# Patient Record
Sex: Male | Born: 1960
Health system: Southern US, Community
[De-identification: ages and names within clinical notes are randomized; demographics above are authoritative.]

## PROBLEM LIST (undated history)

## (undated) DIAGNOSIS — E785 Hyperlipidemia, unspecified: Secondary | ICD-10-CM

## (undated) DIAGNOSIS — Z992 Dependence on renal dialysis: Secondary | ICD-10-CM

## (undated) DIAGNOSIS — E1140 Type 2 diabetes mellitus with diabetic neuropathy, unspecified: Secondary | ICD-10-CM

## (undated) DIAGNOSIS — J449 Chronic obstructive pulmonary disease, unspecified: Secondary | ICD-10-CM

## (undated) DIAGNOSIS — Z9889 Other specified postprocedural states: Secondary | ICD-10-CM

## (undated) DIAGNOSIS — Z87442 Personal history of urinary calculi: Secondary | ICD-10-CM

## (undated) DIAGNOSIS — J309 Allergic rhinitis, unspecified: Secondary | ICD-10-CM

## (undated) DIAGNOSIS — I5022 Chronic systolic (congestive) heart failure: Principal | ICD-10-CM

## (undated) DIAGNOSIS — Z973 Presence of spectacles and contact lenses: Secondary | ICD-10-CM

## (undated) DIAGNOSIS — I428 Other cardiomyopathies: Secondary | ICD-10-CM

## (undated) DIAGNOSIS — J189 Pneumonia, unspecified organism: Secondary | ICD-10-CM

## (undated) DIAGNOSIS — M1990 Unspecified osteoarthritis, unspecified site: Secondary | ICD-10-CM

## (undated) DIAGNOSIS — N200 Calculus of kidney: Secondary | ICD-10-CM

## (undated) DIAGNOSIS — M869 Osteomyelitis, unspecified: Secondary | ICD-10-CM

## (undated) DIAGNOSIS — N186 End stage renal disease: Secondary | ICD-10-CM

## (undated) DIAGNOSIS — I10 Essential (primary) hypertension: Secondary | ICD-10-CM

## (undated) DIAGNOSIS — Z95810 Presence of automatic (implantable) cardiac defibrillator: Secondary | ICD-10-CM

## (undated) DIAGNOSIS — D649 Anemia, unspecified: Secondary | ICD-10-CM

## (undated) DIAGNOSIS — E1121 Type 2 diabetes mellitus with diabetic nephropathy: Secondary | ICD-10-CM

## (undated) DIAGNOSIS — E119 Type 2 diabetes mellitus without complications: Secondary | ICD-10-CM

## (undated) HISTORY — DX: Essential (primary) hypertension: I10

## (undated) HISTORY — DX: Unspecified osteoarthritis, unspecified site: M19.90

## (undated) HISTORY — DX: Chronic obstructive pulmonary disease, unspecified: J44.9

## (undated) HISTORY — DX: Allergic rhinitis, unspecified: J30.9

## (undated) HISTORY — DX: Type 2 diabetes mellitus without complications: E11.9

## (undated) HISTORY — DX: Calculus of kidney: N20.0

## (undated) HISTORY — PX: CARDIAC CATHETERIZATION: SHX172

## (undated) HISTORY — DX: Other specified postprocedural states: Z98.890

## (undated) HISTORY — DX: Anemia, unspecified: D64.9

## (undated) HISTORY — DX: Other cardiomyopathies: I42.8

## (undated) HISTORY — DX: Type 2 diabetes mellitus with diabetic nephropathy: E11.21

## (undated) HISTORY — DX: Pneumonia, unspecified organism: J18.9

## (undated) HISTORY — DX: Hyperlipidemia, unspecified: E78.5

## (undated) HISTORY — PX: HERNIA REPAIR: SHX51

## (undated) NOTE — *Deleted (*Deleted)
Physical Medicine and Rehabilitation Admission H&P    CC: Functional deficits due to BKA   HPI: Anthony Bullock is a 63 year old male with history of T2DM with nephropathy/neuropathy, ESRD- HD, NICM s/p AICD, left foot ulcer with osteomyelitis s/p toe amputation with poor wound healing and gangrenous changes. He was admitted on 11/13/19 for L-BKA by Dr. Sharol Given. Post op  HD ongoing on TTS. He has had issues with hypoglycemia and insulin regimen adjusted per recommendations by diabetes coordinater. Therapy ongoing and patient limited by balance deficits with posterior lean with standing as well as issues with endurance affecting ADLs and mobility. CIR recommended due to functional decline.    Review of Systems  Constitutional: Negative for chills and fever.  HENT: Negative for hearing loss and tinnitus.   Eyes: Negative for blurred vision and double vision.  Respiratory: Negative for cough and shortness of breath.   Cardiovascular: Negative for chest pain and palpitations.  Gastrointestinal: Positive for constipation and nausea. Negative for heartburn.  Musculoskeletal: Negative for myalgias and neck pain.  Skin: Negative for itching and rash.  Neurological: Positive for weakness. Negative for dizziness, sensory change, speech change and headaches.  Psychiatric/Behavioral: The patient has insomnia (gets up every 4 hours due to pain).      Past Medical History:  Diagnosis Date  . 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 Allergic rhinitis   . Anemia   . Arthritis   . Chronic systolic heart failure ([place])    a. ECHO (12/2012) EF 25-30%, HK entireanteroseptal myocardium //  b.  EF 25%, diffuse HK, grade 1 diastolic dysfunction, MAC, mild LAE, normal RVSF, trivial pericardial effusion  . COPD (chronic obstructive pulmonary disease) ([place])   . Diabetes mellitus type II   . Diabetic nephropathy ([place])   . Diabetic neuropathy ([place])   . ESRD on hemodialysis  [facility])    started HD June 2017, goes to [facility] HD unit, Dr Hinda Lenis  . History of cardiac catheterization    a.Myoview 1/15:  There is significant left ventricular dysfunction. There may be slight scar at the apex. There is no significant ischemia. LV Ejection Fraction: 27%  //  b. RHC/LHC (1/15) with mean RA 6, PA 47/22 mean 33, mean PCWP 20, PVR 2.5 WU, CI 2.5; 80% dLAD stenosis, 70% diffuse large D.   . History of kidney stones   . Hyperlipidemia   . Hypertension   . Kidney stones   . NICM (nonischemic cardiomyopathy) ([place])    Primarily nonischemic. Echo (12/14) with EF 25-30%. Echo (3/15) with EF 25%, mild to moderately dilated LV, normal RV size and systolic function.   . Osteomyelitis ([place])    left fifth ray  . Pneumonia   . Urethral stricture   . Wears glasses     Past Surgical History:  Procedure Laterality Date  . ABDOMINAL AORTOGRAM W/LOWER EXTREMITY N/A 03/30/2016   Procedure: Abdominal Aortogram w/Lower Extremity;  Surgeon: Angelia Mould, MD;  Location: [place] CV LAB;  Service: Cardiovascular;  Laterality: N/A;  . AMPUTATION Right 04/26/2016   Procedure: Right Below Knee Amputation;  Surgeon: Newt Minion, MD;  Location: [place];  Service: Orthopedics;  Laterality: Right;  . AMPUTATION Left 08/21/2019   Procedure: LEFT FOOT 5TH RAY AMPUTATION;  Surgeon: Newt Minion, MD;  Location: [place];  Service: Orthopedics;  Laterality: Left;  . AMPUTATION Left 11/13/2019   Procedure: LEFT BELOW KNEE AMPUTATION;  Surgeon: Newt Minion,  MD;  Location: [place];  Service: Orthopedics;  Laterality: Left;  . AV FISTULA PLACEMENT Right 09/08/2015   Procedure: INSERTION OF 4-15m x 45cm  ARTERIOVENOUS (AV) GORE-TEX GRAFT RIGHT UPPER  ARM;  Surgeon: CAngelia Mould MD;  [place]  Service: Vascular;  Laterality: Right;  . AV FISTULA PLACEMENT Left 01/14/2016   Procedure: CREATION OF LEFT UPPER ARM ARTERIOVENOUS FISTULA;  Surgeon: CAngelia Mould MD;  [place]  Service: Vascular;  Laterality: Left;  . BASCILIC VEIN TRANSPOSITION Right 08/22/2014   Procedure: RIGHT UPPER ARM BASCILIC VEIN TRANSPOSITION;  Surgeon: CAngelia Mould MD;  [place]  Service: Vascular;  Laterality: Right;  . BELOW KNEE LEG AMPUTATION Right 04/26/2016  . CARDIAC CATHETERIZATION    . CARDIAC DEFIBRILLATOR PLACEMENT  06/27/2013   Sub Q       BY DR KCaryl Comes . CATARACT EXTRACTION W/PHACO Right 08/06/2018   Procedure: CATARACT EXTRACTION PHACO AND INTRAOCULAR LENS PLACEMENT (IOC);  Surgeon: WBaruch Goldmann MD;  Location: [facility];  Service: Ophthalmology;  Laterality: Right;  CDE: 4.06  . CATARACT EXTRACTION W/PHACO Left 08/20/2018   Procedure: CATARACT EXTRACTION PHACO AND INTRAOCULAR LENS PLACEMENT (IOC);  Surgeon: WBaruch Goldmann MD;  Location: [facility];  Service: Ophthalmology;  Laterality: Left;  CDE: 6.76  . COLONOSCOPY WITH PROPOFOL N/A 07/22/2015   Procedure: COLONOSCOPY WITH PROPOFOL;  Surgeon: HDoran Stabler MD;  [facility];  Service: Gastroenterology;  Laterality: N/A;  . FEMORAL-POPLITEAL BYPASS GRAFT Right 03/31/2016   Procedure: BYPASS GRAFT FEMORAL-POPLITEAL ARTERY USING RIGHT GREATER SAPHENOUS NONREVERSED VEIN;  Surgeon: CAngelia Mould MD;  [place]  Service: Vascular;  Laterality: Right;  . HERNIA REPAIR    . I & D EXTREMITY Right 03/31/2016   Procedure: IRRIGATION AND DEBRIDEMENT FOOT;  Surgeon: CAngelia Mould MD;  [place]  Service: Vascular;  Laterality: Right;  . IMPLANTABLE CARDIOVERTER DEFIBRILLATOR IMPLANT N/A 06/27/2013   Procedure: SUB Q ICD;  Surgeon: SDeboraha Sprang MD;  [facility];  Service: Cardiovascular;  Laterality: N/A;  . INTRAOPERATIVE ARTERIOGRAM Right 03/31/2016   Procedure: INTRA OPERATIVE ARTERIOGRAM;  Surgeon: CAngelia Mould MD;  [place]  Service: Vascular;  Laterality: Right;  . IR GENERIC HISTORICAL Right 11/30/2015   IR THROMBECTOMY AV FISTULA  W/THROMBOLYSIS/PTA INC/SHUNT/IMG RIGHT 11/30/2015 GAletta Edouard MD [facility]  . IR GENERIC HISTORICAL  11/30/2015   IR [place] ACCESS RIGHT 11/30/2015 GAletta Edouard MD [facility]  . IR GENERIC HISTORICAL Right 12/15/2015   IR THROMBECTOMY AV FISTULA W/THROMBOLYSIS/PTA/STENT INC/SHUNT/IMG RT 12/15/2015 DArne Cleveland MD [facility]  . IR GENERIC HISTORICAL  12/15/2015   IR [place] V[place] ACCESS RIGHT 12/15/2015 DArne Cleveland MD [facility]  . IR GENERIC HISTORICAL  12/28/2015   IR FLUORO GUIDE CV LINE RIGHT 12/28/2015 AMarybelle Killings MD [facility]  . IR GENERIC HISTORICAL  12/28/2015   IR [place] V[place] ACCESS RIGHT 12/28/2015 AMarybelle Killings MD [facility]  . LEFT A ND RIGHT HEART CATH  01/30/2013   DR BSung Amabile . LEFT AND RIGHT HEART CATHETERIZATION WITH CORONARY ANGIOGRAM N/A 01/30/2013   Procedure: LEFT AND RIGHT HEART CATHETERIZATION WITH CORONARY ANGIOGRAM;  Surgeon: DJolaine Artist MD;  [facility];  Service: Cardiovascular;  Laterality: N/A;  . PERIPHERAL VASCULAR CATHETERIZATION Right 01/26/2015   Procedure: A/V Fistulagram;  Surgeon: CAngelia Mould MD;  [facility];  Service: Cardiovascular;  Laterality: Right;  .  reapea urethral surgery for recurrent obstruction  2011  . TOTAL KNEE ARTHROPLASTY Right 2007  . VEIN HARVEST Right 03/31/2016   Procedure: RIGHT GREATER SAPHENOUS VEIN HARVEST;  Surgeon: Angelia Mould, MD;  [facility];  Service: Vascular;  Laterality: Right;    Family History  Problem Relation Age of Onset  . Bladder Cancer Mother   . Alcohol abuse Father   . Melanoma Father   . Stroke Maternal Grandmother   . Heart Problems Maternal Grandmother        unknown  . Diabetes Maternal Grandmother   . Heart disease Maternal Grandfather   . Prostate cancer Maternal Grandfather     Social History:  Married. Used to work for [employer]--disabled. He reports that he quit smoking about 9 years ago. His smoking use  included cigarettes. He has a 64.00 pack-year smoking history. He has never used smokeless tobacco. He reports that he does not drink alcohol and does not use drugs.   Allergies  Allergen Reactions  . Epoetin Alfa Other (See Comments)    unknown  . Ferumoxytol Other (See Comments)    unknown  . Morphine Sulfate Rash and Other (See Comments)    Itches all over, red spots    Medications Prior to Admission  Medication Sig Dispense Refill  . amLODipine (NORVASC) 10 MG tablet Take 1 tablet (10 mg total) by mouth daily. 90 tablet 2  . aspirin EC 81 MG tablet Take 81 mg by mouth daily.     [name] atorvastatin (LIPITOR) 80 MG tablet Take 1 tablet (80 mg total) by mouth at bedtime. 90 tablet 3  . carvedilol (COREG) 25 MG tablet Take 1 tablet (25 mg total) by mouth 2 (two) times daily. 180 tablet 2  . doxycycline (VIBRA-TABS) 100 MG tablet Take 1 tablet (100 mg total) by mouth 2 (two) times daily. 28 tablet 0  . fenofibrate 160 MG tablet Take 1 tablet (160 mg total) by mouth daily. 90 tablet 3  . fexofenadine (ALLEGRA) 180 MG tablet Take 180 mg by mouth daily.    . furosemide (LASIX) 40 MG tablet Take 3 tablets by mouth once daily (Patient taking differently: Take 120 mg by mouth daily. ) 270 tablet 0  . gabapentin (NEURONTIN) 300 MG capsule Take 2 capsules (600 mg total) by mouth 2 (two) times daily. 360 capsule 3  . Insulin Glargine (BASAGLAR KWIKPEN) 100 UNIT/ML INJECT 70 UNITS SUBCUTANEOUSLY AT BEDTIME (Patient taking differently: Inject 2-80 Units into the skin at bedtime as needed (low blood glucose). Sliding scale) 15 mL 11  . Insulin Lispro (HUMALOG KWIKPEN) 200 UNIT/ML SOPN Inject 10 Units into the skin 3 (three) times daily with meals. Use tid with meals as per sliding scale (Patient taking differently: Inject 2-14 Units into the skin 3 (three) times daily with meals. sliding scale) 4 pen 6  . lanthanum (FOSRENOL) 500 MG chewable tablet Chew 500-1,000 mg by mouth See admin instructions. Take  2000 mg with meals three time a day and 500 mg with snacks    . multivitamin (RENA-VIT) TABS tablet Take 1 tablet by mouth daily. 90 tablet 3  . albuterol (VENTOLIN HFA) 108 (90 Base) MCG/ACT inhaler Inhale 2 puffs into the lungs every 4 (four) hours as needed for wheezing or shortness of breath. 1 each 1  . azelastine (ASTELIN) 0.1 % nasal spray Place 2 sprays into both nostrils 3 (three) times daily as needed for rhinitis. Use in each nostril as directed 30 mL 12  .  Continuous Blood Gluc Sensor (FREESTYLE LIBRE 14 DAY SENSOR) MISC USE AS DIRECTED EVERY 14 DAYS 4 each 11  . glucose blood test strip Check 1 time daily. E11.9 One Touch Ultra Blue Test Strips 100 each 3  . Insulin Pen Needle (BD PEN NEEDLE NANO U/F) 32G X 4 MM MISC USE 1 PEN NEEDLE SUBCUTANEOUSLY WITH INSULIN 4 TIMES DAILY 400 each 0  . nitroGLYCERIN (NITRODUR - DOSED IN MG/24 HR) 0.2 mg/hr patch Place 1 patch (0.2 mg total) onto the skin daily. (Patient not taking: Reported on 11/08/2019) 30 patch 4  . Olopatadine HCl (PATADAY) 0.2 % SOLN Place 1 drop into both eyes daily as needed (for allergies).       Drug Regimen Review { DRUG REGIMEN [id number]}  Home: Home Living Family/patient expects to be discharged to:: Private residence Living Arrangements: Spouse/significant other Available Help at Discharge: Family, Available PRN/intermittently Type of Home: House Home Access: Ramped entrance (steep ramp) Home Layout: One level Bathroom Toilet: Handicapped height (BSC over toilet) Bathroom Accessibility: No Home Equipment: Walker - 2 wheels, Wheelchair - manual, Bedside commode, Other (comment) (sliding board) Additional Comments: Wheelchair will not fit in bathroom/bedroom doorways. Wife works full time, but reports able to work from home as needed. Have some church members that can assist as needed   Functional History: Prior Function Level of Independence: Needs assistance Gait / Transfers Assistance Needed: used  wheelchair/RW to get around in the house. Wife pushed pt in wheelchair in/out of the home ADL's / Homemaking Assistance Needed: Wife assisted with sponge bathing, toilet transfers Comments: Prior to recent foot amputation, pt Independent in all daily tasks, was walking with R prosthetic LE and no AD. Has required increased assistance for ADLs/transfers since foot amputations  Functional Status:  Mobility: Bed Mobility Overal bed mobility: Needs Assistance Bed Mobility: Supine to Sit Supine to sit: Mod assist, HOB elevated Sit to supine: Mod assist General bed mobility comments: Mod A for trunk advancement and scooting R hip forward Transfers Overall transfer level: Needs assistance Equipment used: Rolling walker (2 wheeled), None Transfers: Sit to/from Stand, Radiographer, therapeutic Sit to Stand: Max assist, +2 physical assistance, +2 safety/equipment Anterior-Posterior transfers: Min assist General transfer comment: Initially attempted sit to stand transfers in prep for pivot to chair with RW and R prosthetic. Pt Max A x 2 for sit to stand, Max A to maintain standing balance with posterior lean. Opted for AP transfer from bed <> recliner with Min A at most and cueing for sequencing      ADL: ADL Overall ADL's : Needs assistance/impaired Eating/Feeding: Set up, Sitting Grooming: Sitting, Min guard Upper Body Bathing: Minimal assistance, Sitting Lower Body Bathing: Maximal assistance, Sitting/lateral leans Upper Body Dressing : Minimal assistance, Sitting Upper Body Dressing Details (indicate cue type and reason): Min A to doff/don new gown Lower Body Dressing: Moderate assistance, Sitting/lateral leans Lower Body Dressing Details (indicate cue type and reason): Pt overall min guard for donning R prosthetic sitting EOB to maintain balance. Pt will require increased assist for donning pants/underwear but will do better with lateral leans. Standing for ADLs unsafe at this time  Toileting- Clothing Manipulation and Hygiene: Maximal assistance, Sitting/lateral lean, Bed level General ADL Comments: Improved pain control, still limited by weakness and decreased balance  Cognition: Cognition Overall Cognitive Status: Within Functional Limits for tasks assessed Orientation Level: Oriented X4 Cognition Arousal/Alertness: Awake/alert Behavior During Therapy: WFL for tasks assessed/performed Overall Cognitive Status: Within Functional Limits for tasks assessed General Comments: pleasant and  cooperative  Physical Exam: Blood pressure 136/78, pulse 82, temperature 98.4 F (36.9 C), temperature source Oral, resp. rate 17, height _0  (1.778 m), weight 82.1 kg, SpO2 97 %. Physical Exam Vitals and nursing note reviewed.  Constitutional:      Appearance: Normal appearance.  Musculoskeletal:     Comments: Right BKA site well healed.  L-BKAn with limb guard over wound VAC.   Neurological:     Mental Status: He is alert.     Results for orders placed or performed during the hospital encounter of 11/13/19 (from the past 48 hour(s))  Glucose, capillary     Status: Abnormal   Collection Time: 11/13/19 12:23 PM  Result Value Ref Range   Glucose-Capillary 140 (H) 70 - 99 mg/dL    Comment: Glucose reference range applies only to samples taken after fasting for at least 8 hours.  Glucose, capillary     Status: Abnormal   Collection Time: 11/13/19  1:25 PM  Result Value Ref Range   Glucose-Capillary 146 (H) 70 - 99 mg/dL    Comment: Glucose reference range applies only to samples taken after fasting for at least 8 hours.  Glucose, capillary     Status: Abnormal   Collection Time: 11/13/19  4:37 PM  Result Value Ref Range   Glucose-Capillary 136 (H) 70 - 99 mg/dL    Comment: Glucose reference range applies only to samples taken after fasting for at least 8 hours.  Hemoglobin A1c     Status: Abnormal   Collection Time: 11/13/19  4:43 PM  Result Value Ref Range   Hgb  A1c MFr Bld 6.5 (H) 4.8 - 5.6 %    Comment: (NOTE) Pre diabetes:          5.7%-6.4%  Diabetes:              >6.4%  Glycemic control for   <7.0% adults with diabetes    Mean Plasma Glucose 139.85 mg/dL    Comment: Performed at [facility] [address], [place], [place] [address]  Glucose, capillary     Status: Abnormal   Collection Time: 11/13/19  8:58 PM  Result Value Ref Range   Glucose-Capillary 244 (H) 70 - 99 mg/dL    Comment: Glucose reference range applies only to samples taken after fasting for at least 8 hours.  Glucose, capillary     Status: None   Collection Time: 11/14/19  6:32 AM  Result Value Ref Range   Glucose-Capillary 85 70 - 99 mg/dL    Comment: Glucose reference range applies only to samples taken after fasting for at least 8 hours.  Hepatitis B surface antigen     Status: None   Collection Time: 11/14/19  6:51 AM  Result Value Ref Range   Hepatitis B Surface Ag NON REACTIVE NON REACTIVE    Comment: Performed at [facility] [address], [place], [place] [address]  CBC     Status: Abnormal   Collection Time: 11/14/19  6:51 AM  Result Value Ref Range   WBC 11.1 (H) 4.0 - 10.5 K/uL   RBC 3.90 (L) 4.22 - 5.81 MIL/uL   Hemoglobin 11.1 (L) 13.0 - 17.0 g/dL   HCT 35.1 (L) 39 - 52 %   MCV 90.0 80.0 - 100.0 fL   MCH 28.5 26.0 - 34.0 pg   MCHC 31.6 30.0 - 36.0 g/dL   RDW 17.1 (H) 11.5 - 15.5 %   Platelets 148 (L) 150 -  400 K/uL   nRBC 0.0 0.0 - 0.2 %    Comment: Performed at [facility], [place] [address] [place], [place] [address]  Renal function panel     Status: Abnormal   Collection Time: 11/14/19  6:51 AM  Result Value Ref Range   Sodium 136 135 - 145 mmol/L   Potassium 5.6 (H) 3.5 - 5.1 mmol/L   Chloride 100 98 - 111 mmol/L   CO2 24 22 - 32 mmol/L   Glucose, Bld 104 (H) 70 - 99 mg/dL    Comment: Glucose reference range applies only to samples taken after fasting for at least 8 hours.   BUN 65 (H) 6 - 20 mg/dL   Creatinine, Ser  11.29 (H) 0.61 - 1.24 mg/dL   Calcium 8.6 (L) 8.9 - 10.3 mg/dL   Phosphorus 3.4 2.5 - 4.6 mg/dL   Albumin 3.0 (L) 3.5 - 5.0 g/dL   GFR, Estimated 5 (L) >60 mL/min    Comment: (NOTE) Calculated using the CKD-EPI Creatinine Equation (2021)    Anion gap 12 5 - 15    Comment: Performed at [facility] [address], [place], [place] [address]  Glucose, capillary     Status: None   Collection Time: 11/14/19 11:37 AM  Result Value Ref Range   Glucose-Capillary 81 70 - 99 mg/dL    Comment: Glucose reference range applies only to samples taken after fasting for at least 8 hours.  Glucose, capillary     Status: Abnormal   Collection Time: 11/14/19  4:58 PM  Result Value Ref Range   Glucose-Capillary 127 (H) 70 - 99 mg/dL    Comment: Glucose reference range applies only to samples taken after fasting for at least 8 hours.  Glucose, capillary     Status: Abnormal   Collection Time: 11/14/19  7:35 PM  Result Value Ref Range   Glucose-Capillary 141 (H) 70 - 99 mg/dL    Comment: Glucose reference range applies only to samples taken after fasting for at least 8 hours.  Glucose, capillary     Status: None   Collection Time: 11/15/19  6:56 AM  Result Value Ref Range   Glucose-Capillary 78 70 - 99 mg/dL    Comment: Glucose reference range applies only to samples taken after fasting for at least 8 hours.  CBC     Status: Abnormal   Collection Time: 11/15/19  8:04 AM  Result Value Ref Range   WBC 9.0 4.0 - 10.5 K/uL   RBC 3.98 (L) 4.22 - 5.81 MIL/uL   Hemoglobin 11.3 (L) 13.0 - 17.0 g/dL   HCT 36.3 (L) 39 - 52 %   MCV 91.2 80.0 - 100.0 fL   MCH 28.4 26.0 - 34.0 pg   MCHC 31.1 30.0 - 36.0 g/dL   RDW 17.3 (H) 11.5 - 15.5 %   Platelets 151 150 - 400 K/uL   nRBC 0.0 0.0 - 0.2 %    Comment: Performed at [facility] [address] [place], [place] [address]  Renal function panel     Status: Abnormal   Collection Time: 11/15/19  8:04 AM  Result Value Ref Range   Sodium 136 135 -  145 mmol/L   Potassium 4.6 3.5 - 5.1 mmol/L    Comment: NO VISIBLE HEMOLYSIS   Chloride 97 (L) 98 - 111 mmol/L   CO2 26 22 - 32 mmol/L   Glucose, Bld 93 70 - 99 mg/dL    Comment:  Glucose reference range applies only to samples taken after fasting for at least 8 hours.   BUN 34 (H) 6 - 20 mg/dL   Creatinine, Ser 8.48 (H) 0.61 - 1.24 mg/dL   Calcium 8.6 (L) 8.9 - 10.3 mg/dL   Phosphorus 4.3 2.5 - 4.6 mg/dL   Albumin 3.0 (L) 3.5 - 5.0 g/dL   GFR, Estimated 7 (L) >60 mL/min    Comment: (NOTE) Calculated using the CKD-EPI Creatinine Equation (2021)    Anion gap 13 5 - 15    Comment: Performed at [facility] [address], [place], [place] [address]  Glucose, capillary     Status: None   Collection Time: 11/15/19 11:05 AM  Result Value Ref Range   Glucose-Capillary 93 70 - 99 mg/dL    Comment: Glucose reference range applies only to samples taken after fasting for at least 8 hours.   No results found.     Medical Problem List and Plan: 1.  *** secondary to ***  -patient may *** shower  -ELOS/Goals: *** 2.  Antithrombotics: -DVT/anticoagulation:  Pharmaceutical: Heparin  -antiplatelet therapy: ASA 3. Pain Management:  Oxycodone prn not effective. Still taking IV dilaudid    --will decrease gabapentin to 100 mg tid (renal dose) 4. Mood: LCSW to follow for evaluation and support.   -antipsychotic agents: N/A 5. Neuropsych: This patient is capable of making decisions on his own behalf. 6. Skin/Wound Care: Routine pressure relief measures.    --Wound VAC to stay in place till 11/10 7. Fluids/Electrolytes/Nutrition: Strict I/O.   -- Renal/CM diet with 1200 cc/FR 8. HTN : Monitor BP tid--continue Amlodipine, coreg and Furosemide 9. ESRD: HD - TTS with nephrology following for assist.    --Schedule HD at the end of the day to help with tolerance of therapy.  10. T2DM with neuropathy/nephropathy: Hgb A1c-6.5 and well controlled.  Reports was on Basaglar 12 units at bedtime  with meal coverage/SSI.      --Lantus decreased to 25 units on 11/05. May need to decrease further as activity increases.  11.NICM s/p AICD: Monitor for symptoms with increase in mobility.  Continue Lipitor, coreg, ASA, fenofibrate and Lasix  12. Nausea: Has been intermittent. Question due to constipation--will change colace to Senna S and add pepcid for dyspepsia.     ***  Bary Leriche, PA-C 11/15/2019

## (undated) NOTE — *Deleted (*Deleted)
Pharmacy Resident Rounding Note - for learning purposes only, not an active part of the chart  S/o Admit Complaint: osteo, toe amp >> s/p left BKA    Anticoagulation [place] hep q8 Infectious Disease  Cephalexin 500mg  q12h - for 'red spot' which is improving, dose appropriate >> entered 10d stop for abx Cardiovascular Amlo5, bASA, ator 80, coreg 25, fenofibrate  Endocrinology CBGs <180 basaglar 12u HS at home, lantus 24u HS here >> 12 units HS + sSSI -CBGs controlled Gastrointestinal / Nutrition famo Neurology Gaba 100 + 200 HS, trazodone Nephrology - esrd on tts schedule w/ TDC, has needed tpa for clotting ESA outpt epo 4k u IV TIW HD 11/16 (3.5h, 1.7L UF),  11/13: Alb 2.6, CoCa 9.8, phos 6.8 -furosemide 120mg  qd  ---sensipar, hectorol, lanthanum, mvi Hgb 10-11, no need for ESA   Pulmonary Hematology / Oncology PTA Medication Issues Best Practices  CKD HD 11/16, next 11/18 - need new dry weight  >> 83kg now, should be less w/ BKA, trying to increase UF Watch for [facility] clotting Watch aluminum hydroxide use Watch CBGs, may need less insulin >> reduced to 12u HS Added 10d abx stop date

---

## 2000-12-27 ENCOUNTER — Encounter: Admission: RE | Admit: 2000-12-27 | Discharge: 2001-02-15 | Payer: Self-pay | Admitting: Specialist

## 2003-01-13 ENCOUNTER — Encounter: Admission: RE | Admit: 2003-01-13 | Discharge: 2003-02-25 | Payer: Self-pay | Admitting: Specialist

## 2003-02-28 ENCOUNTER — Ambulatory Visit (HOSPITAL_BASED_OUTPATIENT_CLINIC_OR_DEPARTMENT_OTHER): Admission: RE | Admit: 2003-02-28 | Discharge: 2003-02-28 | Payer: Self-pay | Admitting: Urology

## 2003-02-28 ENCOUNTER — Ambulatory Visit (HOSPITAL_COMMUNITY): Admission: RE | Admit: 2003-02-28 | Discharge: 2003-02-28 | Payer: Self-pay | Admitting: Urology

## 2005-01-10 HISTORY — PX: TOTAL KNEE ARTHROPLASTY: SHX125

## 2005-08-31 ENCOUNTER — Ambulatory Visit: Admission: RE | Admit: 2005-08-31 | Discharge: 2005-08-31 | Payer: Self-pay | Admitting: Specialist

## 2005-09-23 ENCOUNTER — Inpatient Hospital Stay (HOSPITAL_COMMUNITY): Admission: RE | Admit: 2005-09-23 | Discharge: 2005-09-27 | Payer: Self-pay | Admitting: Specialist

## 2005-10-17 ENCOUNTER — Encounter: Admission: RE | Admit: 2005-10-17 | Discharge: 2005-12-19 | Payer: Self-pay | Admitting: Specialist

## 2006-01-17 ENCOUNTER — Encounter: Admission: RE | Admit: 2006-01-17 | Discharge: 2006-03-07 | Payer: Self-pay | Admitting: Specialist

## 2008-01-14 ENCOUNTER — Encounter: Payer: Self-pay | Admitting: Family Medicine

## 2008-01-21 ENCOUNTER — Encounter: Admission: RE | Admit: 2008-01-21 | Discharge: 2008-02-26 | Payer: Self-pay | Admitting: Specialist

## 2008-03-21 ENCOUNTER — Ambulatory Visit (HOSPITAL_BASED_OUTPATIENT_CLINIC_OR_DEPARTMENT_OTHER): Admission: RE | Admit: 2008-03-21 | Discharge: 2008-03-21 | Payer: Self-pay | Admitting: Urology

## 2008-03-25 ENCOUNTER — Telehealth: Payer: Self-pay | Admitting: Family Medicine

## 2008-04-14 ENCOUNTER — Ambulatory Visit: Payer: Self-pay | Admitting: Family Medicine

## 2008-04-14 DIAGNOSIS — E1169 Type 2 diabetes mellitus with other specified complication: Secondary | ICD-10-CM | POA: Insufficient documentation

## 2008-04-14 DIAGNOSIS — E1129 Type 2 diabetes mellitus with other diabetic kidney complication: Secondary | ICD-10-CM | POA: Insufficient documentation

## 2008-04-14 DIAGNOSIS — J309 Allergic rhinitis, unspecified: Secondary | ICD-10-CM | POA: Insufficient documentation

## 2008-04-14 DIAGNOSIS — I10 Essential (primary) hypertension: Secondary | ICD-10-CM | POA: Insufficient documentation

## 2008-04-14 DIAGNOSIS — E785 Hyperlipidemia, unspecified: Secondary | ICD-10-CM | POA: Insufficient documentation

## 2008-04-14 DIAGNOSIS — E1165 Type 2 diabetes mellitus with hyperglycemia: Secondary | ICD-10-CM

## 2008-04-17 ENCOUNTER — Telehealth: Payer: Self-pay | Admitting: Family Medicine

## 2008-04-17 LAB — CONVERTED CEMR LAB
ALT: 16 units/L (ref 0–53)
AST: 17 units/L (ref 0–37)
Albumin: 3.6 g/dL (ref 3.5–5.2)
Alkaline Phosphatase: 48 units/L (ref 39–117)
BUN: 19 mg/dL (ref 6–23)
Bilirubin, Direct: 0.1 mg/dL (ref 0.0–0.3)
CO2: 26 meq/L (ref 19–32)
Calcium: 8.5 mg/dL (ref 8.4–10.5)
Chloride: 109 meq/L (ref 96–112)
Cholesterol: 156 mg/dL (ref 0–200)
Creatinine, Ser: 0.9 mg/dL (ref 0.4–1.5)
GFR calc non Af Amer: 96.31 mL/min (ref >60)
Glucose, Bld: 147 mg/dL — ABNORMAL HIGH (ref 70–99)
HDL: 39.8 mg/dL (ref >39.00)
Hgb A1c MFr Bld: 7.9 % — ABNORMAL HIGH (ref 4.6–6.5)
LDL Cholesterol: 106 mg/dL — ABNORMAL HIGH (ref 0–99)
Potassium: 4.5 meq/L (ref 3.5–5.1)
Sodium: 141 meq/L (ref 135–145)
Total Bilirubin: 0.6 mg/dL (ref 0.3–1.2)
Total CHOL/HDL Ratio: 4
Total Protein: 6.4 g/dL (ref 6.0–8.3)
Triglycerides: 53 mg/dL (ref 0.0–149.0)
VLDL: 10.6 mg/dL (ref 0.0–40.0)

## 2008-05-02 ENCOUNTER — Encounter: Payer: Self-pay | Admitting: Family Medicine

## 2008-07-23 ENCOUNTER — Telehealth: Payer: Self-pay | Admitting: Family Medicine

## 2008-07-30 ENCOUNTER — Ambulatory Visit: Payer: Self-pay | Admitting: Family Medicine

## 2008-07-30 DIAGNOSIS — R319 Hematuria, unspecified: Secondary | ICD-10-CM | POA: Insufficient documentation

## 2008-07-30 DIAGNOSIS — N3000 Acute cystitis without hematuria: Secondary | ICD-10-CM | POA: Insufficient documentation

## 2008-07-30 LAB — CONVERTED CEMR LAB
Bilirubin Urine: NEGATIVE
Blood in Urine, dipstick: NEGATIVE
Ketones, urine, test strip: NEGATIVE
Nitrite: NEGATIVE
Specific Gravity, Urine: 1.025
Urobilinogen, UA: 0.2
WBC Urine, dipstick: NEGATIVE
pH: 5.5

## 2008-10-03 ENCOUNTER — Ambulatory Visit: Payer: Self-pay | Admitting: Family Medicine

## 2008-10-06 LAB — CONVERTED CEMR LAB: Hgb A1c MFr Bld: 10.2 % — ABNORMAL HIGH (ref 4.6–6.5)

## 2008-11-13 ENCOUNTER — Ambulatory Visit: Payer: Self-pay | Admitting: Family Medicine

## 2008-11-13 DIAGNOSIS — IMO0002 Reserved for concepts with insufficient information to code with codable children: Secondary | ICD-10-CM | POA: Insufficient documentation

## 2009-01-10 HISTORY — PX: OTHER SURGICAL HISTORY: SHX169

## 2009-02-18 ENCOUNTER — Ambulatory Visit: Payer: Self-pay | Admitting: Family Medicine

## 2009-02-18 LAB — CONVERTED CEMR LAB
ALT: 21 units/L (ref 0–53)
AST: 20 units/L (ref 0–37)
Albumin: 3.8 g/dL (ref 3.5–5.2)
Alkaline Phosphatase: 67 units/L (ref 39–117)
BUN: 12 mg/dL (ref 6–23)
Bilirubin, Direct: 0 mg/dL (ref 0.0–0.3)
CO2: 28 meq/L (ref 19–32)
Calcium: 9.4 mg/dL (ref 8.4–10.5)
Chloride: 104 meq/L (ref 96–112)
Cholesterol: 214 mg/dL — ABNORMAL HIGH (ref 0–200)
Creatinine, Ser: 1 mg/dL (ref 0.4–1.5)
Creatinine,U: 123.8 mg/dL
Direct LDL: 148.4 mg/dL
GFR calc non Af Amer: 84.98 mL/min (ref >60)
Glucose, Bld: 250 mg/dL — ABNORMAL HIGH (ref 70–99)
HDL: 42.9 mg/dL (ref >39.00)
Microalb Creat Ratio: 111.5 mg/g — ABNORMAL HIGH (ref 0.0–30.0)
Microalb, Ur: 13.8 mg/dL — ABNORMAL HIGH (ref 0.0–1.9)
Potassium: 4 meq/L (ref 3.5–5.1)
Sodium: 137 meq/L (ref 135–145)
Total Bilirubin: 0.2 mg/dL — ABNORMAL LOW (ref 0.3–1.2)
Total CHOL/HDL Ratio: 5
Total Protein: 7.3 g/dL (ref 6.0–8.3)
Triglycerides: 165 mg/dL — ABNORMAL HIGH (ref 0.0–149.0)
VLDL: 33 mg/dL (ref 0.0–40.0)

## 2009-02-18 LAB — HM DIABETES FOOT EXAM

## 2009-04-06 ENCOUNTER — Encounter: Payer: Self-pay | Admitting: Family Medicine

## 2009-06-12 ENCOUNTER — Ambulatory Visit: Payer: Self-pay | Admitting: Family Medicine

## 2009-06-12 DIAGNOSIS — J209 Acute bronchitis, unspecified: Secondary | ICD-10-CM | POA: Insufficient documentation

## 2009-08-21 ENCOUNTER — Ambulatory Visit: Payer: Self-pay | Admitting: Family Medicine

## 2009-08-21 DIAGNOSIS — F4321 Adjustment disorder with depressed mood: Secondary | ICD-10-CM | POA: Insufficient documentation

## 2009-08-21 DIAGNOSIS — B3749 Other urogenital candidiasis: Secondary | ICD-10-CM | POA: Insufficient documentation

## 2009-08-21 LAB — CONVERTED CEMR LAB: Blood Glucose, Fingerstick: 351

## 2009-08-24 ENCOUNTER — Ambulatory Visit: Payer: Self-pay | Admitting: Family Medicine

## 2009-08-24 DIAGNOSIS — S8990XA Unspecified injury of unspecified lower leg, initial encounter: Secondary | ICD-10-CM | POA: Insufficient documentation

## 2009-08-24 DIAGNOSIS — S99919A Unspecified injury of unspecified ankle, initial encounter: Secondary | ICD-10-CM

## 2009-08-24 DIAGNOSIS — S99929A Unspecified injury of unspecified foot, initial encounter: Secondary | ICD-10-CM

## 2009-08-25 ENCOUNTER — Encounter: Payer: Self-pay | Admitting: Family Medicine

## 2009-09-24 ENCOUNTER — Ambulatory Visit: Payer: Self-pay | Admitting: Family Medicine

## 2009-09-24 LAB — CONVERTED CEMR LAB
Bilirubin Urine: NEGATIVE
Glucose, Urine, Semiquant: >=1000
Ketones, urine, test strip: NEGATIVE
Nitrite: NEGATIVE
Protein, U semiquant: NEGATIVE
Specific Gravity, Urine: <1.005
Urobilinogen, UA: 0.2
pH: 6.5

## 2009-09-26 ENCOUNTER — Ambulatory Visit: Payer: Self-pay | Admitting: Family Medicine

## 2009-09-26 DIAGNOSIS — N390 Urinary tract infection, site not specified: Secondary | ICD-10-CM | POA: Insufficient documentation

## 2009-09-28 ENCOUNTER — Telehealth: Payer: Self-pay | Admitting: Family Medicine

## 2009-09-28 ENCOUNTER — Ambulatory Visit: Payer: Self-pay | Admitting: Cardiovascular Disease

## 2009-09-28 ENCOUNTER — Ambulatory Visit: Payer: Self-pay | Admitting: Family Medicine

## 2009-09-28 DIAGNOSIS — N209 Urinary calculus, unspecified: Secondary | ICD-10-CM | POA: Insufficient documentation

## 2009-09-28 DIAGNOSIS — R112 Nausea with vomiting, unspecified: Secondary | ICD-10-CM | POA: Insufficient documentation

## 2009-09-28 DIAGNOSIS — N16 Renal tubulo-interstitial disorders in diseases classified elsewhere: Secondary | ICD-10-CM | POA: Insufficient documentation

## 2009-09-28 DIAGNOSIS — R1032 Left lower quadrant pain: Secondary | ICD-10-CM | POA: Insufficient documentation

## 2009-09-28 DIAGNOSIS — R509 Fever, unspecified: Secondary | ICD-10-CM | POA: Insufficient documentation

## 2009-09-28 LAB — CONVERTED CEMR LAB
BUN: 26 mg/dL — ABNORMAL HIGH (ref 6–23)
CO2: 27 meq/L (ref 19–32)
Calcium: 9.2 mg/dL (ref 8.4–10.5)
Chloride: 94 meq/L — ABNORMAL LOW (ref 96–112)
Creatinine, Ser: 1.8 mg/dL — ABNORMAL HIGH (ref 0.4–1.5)
GFR calc non Af Amer: 44.14 mL/min (ref >60)
Glucose, Bld: 439 mg/dL — ABNORMAL HIGH (ref 70–99)
Nitrite: NEGATIVE
Potassium: 5.4 meq/L — ABNORMAL HIGH (ref 3.5–5.1)
Sodium: 132 meq/L — ABNORMAL LOW (ref 135–145)
Specific Gravity, Urine: 1.01
Urobilinogen, UA: 0.2
pH: 5

## 2009-09-29 ENCOUNTER — Encounter: Payer: Self-pay | Admitting: Family Medicine

## 2009-09-29 ENCOUNTER — Ambulatory Visit (HOSPITAL_BASED_OUTPATIENT_CLINIC_OR_DEPARTMENT_OTHER): Admission: RE | Admit: 2009-09-29 | Discharge: 2009-09-29 | Payer: Self-pay | Admitting: Urology

## 2009-10-05 ENCOUNTER — Telehealth: Payer: Self-pay | Admitting: Family Medicine

## 2009-10-05 ENCOUNTER — Encounter: Payer: Self-pay | Admitting: Family Medicine

## 2009-10-13 ENCOUNTER — Encounter: Payer: Self-pay | Admitting: Family Medicine

## 2009-10-30 ENCOUNTER — Telehealth: Payer: Self-pay | Admitting: Family Medicine

## 2009-11-05 ENCOUNTER — Encounter: Payer: Self-pay | Admitting: Family Medicine

## 2009-11-19 ENCOUNTER — Ambulatory Visit (HOSPITAL_COMMUNITY)
Admission: RE | Admit: 2009-11-19 | Discharge: 2009-11-19 | Payer: Self-pay | Source: Home / Self Care | Admitting: Urology

## 2009-11-20 ENCOUNTER — Encounter: Payer: Self-pay | Admitting: Family Medicine

## 2009-12-09 ENCOUNTER — Ambulatory Visit: Payer: Self-pay | Admitting: Family Medicine

## 2009-12-10 ENCOUNTER — Encounter: Payer: Self-pay | Admitting: Family Medicine

## 2010-01-06 ENCOUNTER — Ambulatory Visit: Payer: Self-pay | Admitting: Family Medicine

## 2010-01-06 LAB — CONVERTED CEMR LAB
ALT: 16 units/L (ref 0–53)
AST: 18 units/L (ref 0–37)
Albumin: 3.5 g/dL (ref 3.5–5.2)
Alkaline Phosphatase: 51 units/L (ref 39–117)
BUN: 19 mg/dL (ref 6–23)
Basophils Absolute: 0 10*3/uL (ref 0.0–0.1)
Basophils Relative: 0.3 % (ref 0.0–3.0)
Bilirubin, Direct: 0.1 mg/dL (ref 0.0–0.3)
CO2: 28 meq/L (ref 19–32)
Calcium: 9.3 mg/dL (ref 8.4–10.5)
Chloride: 105 meq/L (ref 96–112)
Cholesterol: 145 mg/dL (ref 0–200)
Creatinine, Ser: 1.5 mg/dL (ref 0.4–1.5)
Eosinophils Absolute: 0.3 10*3/uL (ref 0.0–0.7)
Eosinophils Relative: 3.4 % (ref 0.0–5.0)
GFR calc non Af Amer: 53.02 mL/min — ABNORMAL LOW (ref >60.00)
Glucose, Bld: 89 mg/dL (ref 70–99)
HCT: 37.6 % — ABNORMAL LOW (ref 39.0–52.0)
HDL: 37.9 mg/dL — ABNORMAL LOW (ref >39.00)
Hemoglobin: 12.4 g/dL — ABNORMAL LOW (ref 13.0–17.0)
Hgb A1c MFr Bld: 7.3 % — ABNORMAL HIGH (ref 4.6–6.5)
LDL Cholesterol: 92 mg/dL (ref 0–99)
Lymphocytes Relative: 20 % (ref 12.0–46.0)
Lymphs Abs: 1.7 10*3/uL (ref 0.7–4.0)
MCHC: 33.1 g/dL (ref 30.0–36.0)
MCV: 80.9 fL (ref 78.0–100.0)
Monocytes Absolute: 0.7 10*3/uL (ref 0.1–1.0)
Monocytes Relative: 8 % (ref 3.0–12.0)
Neutro Abs: 5.9 10*3/uL (ref 1.4–7.7)
Neutrophils Relative %: 68.3 % (ref 43.0–77.0)
Platelets: 193 10*3/uL (ref 150.0–400.0)
Potassium: 4.9 meq/L (ref 3.5–5.1)
RBC: 4.64 M/uL (ref 4.22–5.81)
RDW: 18.3 % — ABNORMAL HIGH (ref 11.5–14.6)
Sodium: 138 meq/L (ref 135–145)
TSH: 1.53 microintl units/mL (ref 0.35–5.50)
Total Bilirubin: 0.5 mg/dL (ref 0.3–1.2)
Total CHOL/HDL Ratio: 4
Total Protein: 6.9 g/dL (ref 6.0–8.3)
Triglycerides: 76 mg/dL (ref 0.0–149.0)
VLDL: 15.2 mg/dL (ref 0.0–40.0)
WBC: 8.6 10*3/uL (ref 4.5–10.5)

## 2010-01-18 ENCOUNTER — Encounter: Payer: Self-pay | Admitting: Family Medicine

## 2010-01-18 ENCOUNTER — Ambulatory Visit
Admission: RE | Admit: 2010-01-18 | Discharge: 2010-01-18 | Payer: Self-pay | Source: Home / Self Care | Attending: Family Medicine | Admitting: Family Medicine

## 2010-01-18 DIAGNOSIS — N184 Chronic kidney disease, stage 4 (severe): Secondary | ICD-10-CM | POA: Insufficient documentation
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
## 2010-02-09 NOTE — Letter (Signed)
Summary: [facility]  [facility]   Imported By: Laural Benes 11/10/2009 15:45:45  _____________________________________________________________________  External Attachment:    Type:   Image     Comment:   External Document

## 2010-02-09 NOTE — Progress Notes (Signed)
Summary: Pt req 90 day supply refills on meds to [facility] mail order  Phone Note Refill Request Call back at Home Phone [phone] Message from:  Patient on October 30, 2009 9:05 AM  Refills Requested: Medication #1:  METFORMIN HCL 1000 MG TABS one tab two times a day   Dosage confirmed as above?Dosage Confirmed   Supply Requested: 3 months  Medication #2:  JANUVIA 100 MG TABS once daily   Dosage confirmed as above?Dosage Confirmed   Supply Requested: 3 months  Medication #3:  FENOFIBRATE 160 MG TABS once daily   Dosage confirmed as above?Dosage Confirmed   Supply Requested: 3 months  Medication #4:  SIMVASTATIN 80 MG TABS once daily   Supply Requested: 3 months RAMIPRIL 10 MG CAPS,LANTUS SOLOSTAR 100 UNIT/ML,BD PEN NEEDLE MINI U/F 31G X 5 MM.   90 day supply on all.  Pt has discontinued the Actos and refuses to take any more of it, because med recalled with link to bladder cancer.  7 day average on sugar is 107. His 14 day ave is 110 and 30 day is 146.     Method Requested: Telephone to [facility] order  Initial call taken by: Braulio Bosch,  October 30, 2009 9:05 AM    Prescriptions: FENOFIBRATE 160 MG TABS (FENOFIBRATE) once daily  #90 x 3   Entered by:   Nira Conn LPN   Authorized by:   Carolann Littler MD   Signed by:   Nira Conn LPN on 579FGE   Method used:   Faxed to ...       [facility] (mail-order)             , [place]         Ph: [phone]       Fax: [phone]   [id number] BD PEN NEEDLE MINI U/F 31G X 5 MM MISC (INSULIN PEN NEEDLE) daily as directed  #100 x 3   Entered by:   Nira Conn LPN   Authorized by:   Carolann Littler MD   Signed by:   Nira Conn LPN on 579FGE   Method used:   Faxed to ...       [facility] (mail-order)             , [place]         Ph: [phone]       Fax: [phone]   RxID:   [id number] LANTUS SOLOSTAR 100 UNIT/ML SOLN (INSULIN GLARGINE) use as directed once daily  #1 pen x 6   Entered by:   Nira Conn LPN   Authorized by:   Carolann Littler MD   Signed by:   Nira Conn LPN on 579FGE   Method used:   Faxed to ...       [facility] (mail-order)             , [place]         Ph: [phone]       Fax: [phone]   [id number] RAMIPRIL 10 MG CAPS (RAMIPRIL) one by mouth once daily  #90 x 3   Entered by:   Nira Conn LPN   Authorized by:   Carolann Littler MD   Signed by:   Nira Conn LPN on 579FGE   Method used:   Faxed to ...       [facility] (mail-order)             , [place]  Ph: [phone]       Fax: [phone]   [id number] SIMVASTATIN 80 MG TABS (SIMVASTATIN) once daily  #90 x 3   Entered by:   Nira Conn LPN   Authorized by:   Carolann Littler MD   Signed by:   Nira Conn LPN on 579FGE   Method used:   Faxed to ...       [facility] (mail-order)             , [place]         Ph: [phone]       Fax: [phone]   RxID:   [id number] Donald Siva TEST  STRP (GLUCOSE BLOOD) as directed two times a day  #180 x 3   Entered by:   Nira Conn LPN   Authorized by:   Carolann Littler MD   Signed by:   Nira Conn LPN on 579FGE   Method used:   Faxed to ...       [facility] (mail-order)             , [place]         Ph: [phone]       Fax: [phone]   RxID:   [id number] JANUVIA 100 MG TABS (SITAGLIPTIN PHOSPHATE) once daily  #90 x 3   Entered by:   Nira Conn LPN   Authorized by:   Carolann Littler MD   Signed by:   Nira Conn LPN on 579FGE   Method used:   Faxed to ...       [facility] (mail-order)             , [place]         Ph: [phone]       Fax: [phone]   RxID:   [id number] METFORMIN HCL 1000 MG TABS (METFORMIN HCL) one tab two times a day  #180 x 3   Entered by:   Nira Conn LPN   Authorized by:   Carolann Littler MD   Signed by:   Nira Conn LPN on 579FGE   Method used:   Faxed to ...       [facility] (mail-order)             , [place]         Ph: [phone]       Fax:  [phone]   RxID:   [id number]

## 2010-02-09 NOTE — Consult Note (Signed)
Summary: [facility]  [facility]   Imported By: Laural Benes 10/06/2009 09:55:50  _____________________________________________________________________  External Attachment:    Type:   Image     Comment:   External Document

## 2010-02-09 NOTE — Assessment & Plan Note (Signed)
Summary: blood in urine pt decline sooner ov/njr   Vital Signs:  Patient profile:   50 year old male Temp:     98.2 degrees F BP sitting:   120 / 64  History of Present Illness: Patient is seen with dysuria last week. He had little bit of gross blood in his urine and was called in ciprofloxacin and symptoms have resolved at this time. Denies fevers, nausea, vomiting, or any back pain. Risk factors for UTI include type 2 diabetes and history of urethral stricture which has been dilated in the past by urologist. No current obstructive symptoms.  no further blood since starting the antibiotics.  Allergies: 1)  ! Morphine Sulfate Cr (Morphine Sulfate)  Past History:  Past Medical History: Last updated: 04/14/2008 Allergic rhinitis Diabetes mellitus, type II Hyperlipidemia Hypertension Urrethral stricture  Review of Systems  The patient denies fever, abdominal pain, hematuria, and incontinence.    Physical Exam  General:  Well-developed,well-nourished,in no acute distress; alert,appropriate and cooperative throughout examination Lungs:  Normal respiratory effort, chest expands symmetrically. Lungs are clear to auscultation, no crackles or wheezes. Heart:  Normal rate and regular rhythm. S1 and S2 normal without gallop, murmur, click, rub or other extra sounds.   Impression & Recommendations:  Problem # 1:  ACUTE CYSTITIS (ICD-595.0) patient had very likely recent acute cystitis and is symptomatically improved on antibiotic. No pyuria or hematuria on dipstick today. No further antibiotics at this time. His updated medication list for this problem includes:    Cipro 500 Mg Tabs (Ciprofloxacin hcl) ..... One by mouth two times a day x 7 days    Flavoxate Hcl 100 Mg Tabs (Flavoxate hcl) ..... One by mouth three times a day prn  Problem # 2:  DIABETES MELLITUS, TYPE II (ICD-250.00) patient will followup for repeat A1c in couple months. His updated medication list for this problem  includes:    Metformin Hcl 1000 Mg Tabs (Metformin hcl) ..... One tab two times a day    Januvia 100 Mg Tabs (Sitagliptin phosphate) ..... Once daily    Ramipril 5 Mg Caps (Ramipril) ..... Once daily    Aspirin Adult Low Strength 81 Mg Tbec (Aspirin) .[name]..[name] Two times a day  Complete Medication List: 1)  Onetouch Ultra Test Strp (Glucose blood) .... As directed two times a day 2)  Metformin Hcl 1000 Mg Tabs (Metformin hcl) .... One tab two times a day 3)  Januvia 100 Mg Tabs (Sitagliptin phosphate) .... Once daily 4)  Fexofenadine-pseudoephedrine 60-120 Mg Xr12h-tab (Fexofenadine-pseudoephedrine) .... One tab two times a day 5)  Fenofibrate 160 Mg Tabs (Fenofibrate) .... Once daily 6)  Simvastatin 80 Mg Tabs (Simvastatin) .... Once daily 7)  Ramipril 5 Mg Caps (Ramipril) .... Once daily 8)  Aspirin Adult Low Strength 81 Mg Tbec (Aspirin) .... Two times a day 9)  Vitamin B-12 Cr 2000 Mcg Cr-tabs (Cyanocobalamin) .... Once daily 10)  Cvs Vitamin E 1000 Unit Caps (Vitamin e) .... Once daily 11)  Co-q 10 Omega-3 Fish Oil Caps (Coenzyme q10-fish oil-vit e) .... Once daily 12)  Cinnamon 500 Mg Caps (Cinnamon) .... Two times a day 13)  Garlic 123XX123 Mg Caps (Garlic) .... Two times a day 14)  Onetouch Ultra Mini W/device Kit (Blood glucose monitoring suppl) .... Testing two times a day \\par  15)  Cipro 500 Mg Tabs (Ciprofloxacin hcl) .... One by mouth two times a day x 7 days 16)  Flavoxate Hcl 100 Mg Tabs (Flavoxate hcl) .... One by mouth three  times a day prn  Other Orders: UA Dipstick w/o Micro (automated)  (81003)  Patient Instructions: 1)  Drink lots of water. Follow up immediately if he had recurrent burning or visible blood. 2)  Please schedule a follow-up appointment in 2 weeks.  Prescriptions: FLAVOXATE HCL 100 MG TABS (FLAVOXATE HCL) one by mouth three times a day prn  #30 x 1   Entered and Authorized by:   Carolann Littler MD   Signed by:   Carolann Littler MD on 07/30/2008   Method  used:   Electronically to        [facility] (retail)       [address]       [place], [place]  [address]       Ph: [phone]       Fax: [phone]   RxID:   [id number]   Laboratory Results   Urine Tests    Routine Urinalysis   Color: yellow Appearance: Clear Glucose: 2+   (Normal Range: Negative) Bilirubin: negative   (Normal Range: Negative) Ketone: negative   (Normal Range: Negative) Spec. Gravity: 1.025   (Normal Range: 1.003-1.035) Blood: negative   (Normal Range: Negative) pH: 5.5   (Normal Range: 5.0-8.0) Protein: 1+   (Normal Range: Negative) Urobilinogen: 0.2   (Normal Range: 0-1) Nitrite: negative   (Normal Range: Negative) Leukocyte Esterace: negative   (Normal Range: Negative)    Comments: Joyce Gross  July 30, 2008 1:57 PM

## 2010-02-09 NOTE — Letter (Signed)
Summary: [facility]  [facility]   Imported By: Laural Benes 04/13/2009 12:38:05  _____________________________________________________________________  External Attachment:    Type:   Image     Comment:   External Document

## 2010-02-09 NOTE — Assessment & Plan Note (Signed)
Summary: backache/njr   Vital Signs:  Patient profile:   50 year old male Weight:      192 pounds Temp:     97.8 degrees F oral BP sitting:   140 / 82  (left arm) Cuff size:   regular  Vitals Entered By: Nira Conn LPN (September 15, 624THL 10:10 AM) CC: back pain  X 5-6 days, Back Pain   History of Present Illness: Onset Sat ( 5 days ago) after lifting. Alleve and hydorcodine without relief.       This is a 50 year old man who presents with Back Pain.  The patient denies fever, chills, weakness, loss of sensation, fecal incontinence, and urinary incontinence.  The pain is located in the left thoracic back.  The pain began at home and gradually.  The pain radiates to the left flank.  The pain is made better by heat.  Location is L lower thoracic area with some radiation anteriorly.  Does have hx of kidney stones.  This pain is neither better or worse with position change. No gross hematuria and no burning with urination.  Hx urethral stricture but no signif dysuria. Type 2 diabetes poorly controlled with hx poor compliance.  Allergies: 1)  ! Morphine Sulfate Cr (Morphine Sulfate)  Past History:  Past Medical History: Allergic rhinitis Diabetes mellitus, type II Hyperlipidemia Hypertension Urrethral stricture Hx kidney stones.  Review of Systems      See HPI  Physical Exam  General:  Well-developed,well-nourished,in no acute distress; alert,appropriate and cooperative throughout examination Mouth:  Oral mucosa and oropharynx without lesions or exudates.  Teeth in good repair. Lungs:  Normal respiratory effort, chest expands symmetrically. Lungs are clear to auscultation, no crackles or wheezes. Heart:  Normal rate and regular rhythm. S1 and S2 normal without gallop, murmur, click, rub or other extra sounds. Abdomen:  soft with mild tenderness L upper quadrant with no guarding or rebound.normal bowel sounds, no distention, no guarding, no rigidity, no hepatomegaly, and  no splenomegaly.   Msk:  Slightly tender L flank area.  No rash. No spinal tenderness. Extremities:  SLTS neg.  No edema. Neurologic:  full strenght LEs.  DTRs symmetric. Skin:  no rashes and no suspicious lesions.     Impression & Recommendations:  Problem # 1:  BACK PAIN, THORACIC REGION (ICD-724.1) Assessment New check urine cx.  Start Ciipro.  ?musculoskeletal.  His updated medication list for this problem includes:    Aspirin Adult Low Strength 81 Mg Tbec (Aspirin) .[name]..[name] Two times a day  Orders: UA Dipstick w/o Micro (manual) (81002) T-Culture, Urine [id number])  Problem # 2:  HEMATURIA UNSPECIFIED (ICD-599.70) ?etiology.  ?stone vs infectious vs other.   Pt to f/u to repeat in one week. His updated medication list for this problem includes:    Azithromycin 250 Mg Tabs (Azithromycin) .[name]... 2 by mouth today then one by mouth once daily for 4 days.    Ciprofloxacin Hcl 500 Mg Tabs (Ciprofloxacin hcl) ..... One by mouth two times a day for 7 days  Complete Medication List: 1)  Onetouch Ultra Test Strp (Glucose blood) .... As directed two times a day 2)  Metformin Hcl 1000 Mg Tabs (Metformin hcl) .... One tab two times a day 3)  Januvia 100 Mg Tabs (Sitagliptin phosphate) .... Once daily 4)  Fexofenadine-pseudoephedrine 60-120 Mg Xr12h-tab (Fexofenadine-pseudoephedrine) .... One tab two times a day 5)  Fenofibrate 160 Mg Tabs (Fenofibrate) .... Once daily 6)  Simvastatin 80 Mg Tabs (Simvastatin) .... Once  daily 7)  Ramipril 10 Mg Caps (Ramipril) .... One by mouth once daily 8)  Aspirin Adult Low Strength 81 Mg Tbec (Aspirin) .... Two times a day 9)  Vitamin B-12 Cr 2000 Mcg Cr-tabs (Cyanocobalamin) .... Once daily 10)  Cvs Vitamin E 1000 Unit Caps (Vitamin e) .... Once daily 11)  Co-q 10 Omega-3 Fish Oil Caps (Coenzyme q10-fish oil-vit e) .... Once daily 12)  Cinnamon 500 Mg Caps (Cinnamon) .... Two times a day 13)  Garlic 123XX123 Mg Caps (Garlic) .... Two times a day 14)   Onetouch Ultra Mini W/device Kit (Blood glucose monitoring suppl) .... Testing two times a day \\par  15)  Flavoxate Hcl 100 Mg Tabs (Flavoxate hcl) .... One by mouth three times a day prn 16)  Silvadene 1 % Crea (Silver sulfadiazine) .... Apply to affected burn daily 17)  Actos 45 Mg Tabs (Pioglitazone hcl) .... One po once daily 18)  Lantus Solostar 100 Unit/ml Soln (Insulin glargine) .... Use as directed once daily 19)  Bd Pen Needle Mini U/f 31g X 5 Mm Misc (Insulin pen needle) .... Daily as directed 20)  Azithromycin 250 Mg Tabs (Azithromycin) .... 2 by mouth today then one by mouth once daily for 4 days. 21)  Sertraline Hcl 50 Mg Tabs (Sertraline hcl) .... One by mouth once daily 22)  Nystatin 100000 Unit/gm Crea (Nystatin) .... Apply to affected rash two times a day 23)  Ciprofloxacin Hcl 500 Mg Tabs (Ciprofloxacin hcl) .... One by mouth two times a day for 7 days  Patient Instructions: 1)  Follow up in one week. 2)  Drink plenty of water. Prescriptions: CIPROFLOXACIN HCL 500 MG TABS (CIPROFLOXACIN HCL) one by mouth two times a day for 7 days  #14 x 0   Entered and Authorized by:   Carolann Littler MD   Signed by:   Carolann Littler MD on 09/24/2009   Method used:   Electronically to        [facility] (retail)       [address] [place], [place]  [address]       Ph: [phone]       Fax: [phone]   RxID:   [id number]   Laboratory Results   Urine Tests    Routine Urinalysis   Color: straw Appearance: Clear Glucose: >=1000   (Normal Range: Negative) Bilirubin: negative   (Normal Range: Negative) Ketone: negative   (Normal Range: Negative) Spec. Gravity: <1.005   (Normal Range: 1.003-1.035) Blood: moderate   (Normal Range: Negative) pH: 6.5   (Normal Range: 5.0-8.0) Protein: negative   (Normal Range: Negative) Urobilinogen: 0.2   (Normal Range: 0-1) Nitrite: negative   (Normal Range: Negative) Leukocyte Esterace: small   (Normal  Range: Negative)    Comments: Nira Conn LPN  September 15, 624THL 10:44 AM      Appended Document: backache/njr urine cx neg.  Make sure pt is set to get follow up UA in about one week to reassess hematuria.  Appended Document: backache/njr Spoke to pt, and he has to come back today for nausea/vomiting.

## 2010-02-09 NOTE — Assessment & Plan Note (Signed)
Summary: 2 month roa//lh   Vital Signs:  Patient profile:   50 year old male Temp:     98.3 degrees F oral BP sitting:   170 / 88  (left arm) Cuff size:   regular  Vitals Entered By: Nira Conn LPN (September 24, 624THL 8:38 AM)  Serial Vital Signs/Assessments:  Time      Position  BP       Pulse  Resp  Temp     By                     138/78                         Carolann Littler MD  CC: 2 month F/U, BS running high lately   History of Present Illness: Patient here for medical followup. Diabetes and poorly controlled last A1c 7.9%. Patient on Januvia and metformin.. Refused Actos secondary cost.  recent blood sugars ranging 160-220. Poorly compliant with diet. Some recent weight gain.  Still smoking 2 packs cigarettes per day with low motivation to quit. No regular exercise. Denies any dizziness, headaches, shortness of breath, chest pains, lower extremity edema, or any change in urine or stool habits.    Allergies: 1)  ! Morphine Sulfate Cr (Morphine Sulfate)  Past History:  Past Medical History: Last updated: 04/14/2008 Allergic rhinitis Diabetes mellitus, type II Hyperlipidemia Hypertension Urrethral stricture  Social History: Last updated: 04/14/2008 Occupation: Married Current Smoker Alcohol use-no Regular exercise-no  Review of Systems      See HPI  Physical Exam  General:  Well-developed,well-nourished,in no acute distress; alert,appropriate and cooperative throughout examination Lungs:  Normal respiratory effort, chest expands symmetrically. Lungs are clear to auscultation, no crackles or wheezes. Heart:  Normal rate and regular rhythm. S1 and S2 normal without gallop, murmur, click, rub or other extra sounds. Extremities:  No clubbing, cyanosis, edema, or deformity noted with normal full range of motion of all joints.   Neurologic:  normal sensory function to touch.   Impression & Recommendations:  Problem # 1:  DIABETES MELLITUS, TYPE II  (ICD-250.00)  Poorly controlled. Reassess A1c today. Strongly recommend addition of Lantus if blood sugars remain poorly controlled. His updated medication list for this problem includes:    Metformin Hcl 1000 Mg Tabs (Metformin hcl) ..... One tab two times a day    Januvia 100 Mg Tabs (Sitagliptin phosphate) ..... Once daily    Ramipril 5 Mg Caps (Ramipril) ..... Once daily    Aspirin Adult Low Strength 81 Mg Tbec (Aspirin) .[name]..[name] Two times a day  Orders: TLB-A1C / Hgb A1C (Glycohemoglobin) (83036-A1C)  Problem # 2:  HYPERTENSION (ICD-401.9) Borderline elevation.  Goal Less than 130/85.  Work on weight loss. His updated medication list for this problem includes:    Ramipril 5 Mg Caps (Ramipril) ..... Once daily  Complete Medication List: 1)  Onetouch Ultra Test Strp (Glucose blood) .... As directed two times a day 2)  Metformin Hcl 1000 Mg Tabs (Metformin hcl) .... One tab two times a day 3)  Januvia 100 Mg Tabs (Sitagliptin phosphate) .... Once daily 4)  Fexofenadine-pseudoephedrine 60-120 Mg Xr12h-tab (Fexofenadine-pseudoephedrine) .... One tab two times a day 5)  Fenofibrate 160 Mg Tabs (Fenofibrate) .... Once daily 6)  Simvastatin 80 Mg Tabs (Simvastatin) .... Once daily 7)  Ramipril 5 Mg Caps (Ramipril) .... Once daily 8)  Aspirin Adult Low Strength 81 Mg Tbec (Aspirin) .... Two times a  day 9)  Vitamin B-12 Cr 2000 Mcg Cr-tabs (Cyanocobalamin) .... Once daily 10)  Cvs Vitamin E 1000 Unit Caps (Vitamin e) .... Once daily 11)  Co-q 10 Omega-3 Fish Oil Caps (Coenzyme q10-fish oil-vit e) .... Once daily 12)  Cinnamon 500 Mg Caps (Cinnamon) .... Two times a day 13)  Garlic 123XX123 Mg Caps (Garlic) .... Two times a day 14)  Onetouch Ultra Mini W/device Kit (Blood glucose monitoring suppl) .... Testing two times a day \\par  15)  Cipro 500 Mg Tabs (Ciprofloxacin hcl) .... One by mouth two times a day x 7 days 16)  Flavoxate Hcl 100 Mg Tabs (Flavoxate hcl) .... One by mouth three times a day  prn  Other Orders: Tdap => 62yrs IM [id number]) Admin 1st Vaccine [id number])  Patient Instructions: 1)  Please schedule a follow-up appointment in 3 months .  2)  Check your blood sugars regularly. If your readings are usually above:  or below 70 you should contact our office.  3)  It is important that your diabetic A1c level is checked every 3 months.  4)  See your eye doctor yearly to check for diabetic eye damage. 5)  Check your feet each night  for sore areas, calluses or signs of infection.  6)  It is important that you exercise reguarly at least 20 minutes 5 times a week. If you develop chest pain, have severe difficulty breathing, or feel very tired, stop exercising immediately and seek medical attention.  7)  You need to lose weight. Consider a lower calorie diet and regular exercise.    Immunizations Administered:  Tetanus Vaccine:    Vaccine Type: Tdap    Site: left deltoid    Mfr: San[employer]i Pasteur    Dose: 0.5 ml    Route: IM    Given by: Nira Conn LPN    Exp. Date: 07/17/2010    Lot #: [id number]

## 2010-02-09 NOTE — Assessment & Plan Note (Signed)
Summary: COUGH, CONGESTION, PULLED MUSCLE? // RS   Vital Signs:  Patient profile:   50 year old male Temp:     98.2 degrees F oral BP sitting:   160 / 100  (left arm) Cuff size:   regular  Vitals Entered By: Nira Conn LPN (June  3, 624THL [id number] PM) CC: cough, congestion X 10 days, Cough   History of Present Illness:  Cough      This is a 50 year old man who presents with Cough.  The patient reports productive cough and wheezing, but denies pleuritic chest pain, shortness of breath, fever, and hemoptysis.  Associated symtpoms include cold/URI symptoms.  The patient denies the following symptoms: weight loss and acid reflux symptoms.  The cough is worse with activity and lying down.  Ineffective prior treatments have included OTC cough medication.  Pt continues to smoke.  L rib cage pain from coughing.  Pain worse with cough. some relief with ACE wrap.  Preventive Screening-Counseling & Management  Alcohol-Tobacco     Smoking Status: current  Allergies: 1)  ! Morphine Sulfate Cr (Morphine Sulfate)  Past History:  Past Medical History: Last updated: 04/14/2008 Allergic rhinitis Diabetes mellitus, type II Hyperlipidemia Hypertension Urrethral stricture  Review of Systems      See HPI  Physical Exam  General:  Well-developed,well-nourished,in no acute distress; alert,appropriate and cooperative throughout examination Ears:  External ear exam shows no significant lesions or deformities.  Otoscopic examination reveals clear canals, tympanic membranes are intact bilaterally without bulging, retraction, inflammation or discharge. Hearing is grossly normal bilaterally. Nose:  External nasal examination shows no deformity or inflammation. Nasal mucosa are pink and moist without lesions or exudates. Mouth:  Oral mucosa and oropharynx without lesions or exudates.  Teeth in good repair. Neck:  No deformities, masses, or tenderness noted. Lungs:  wheezes diffuslely but no  retractions. no rales Heart:  normal rate and regular rhythm.     Impression & Recommendations:  Problem # 1:  ACUTE BRONCHITIS (ICD-466.0) does have some reactive airway component.  Stop smoking.  start antibiotics.  Samples Advair 100/50 with instruction for use. The following medications were removed from the medication list:    Cipro 500 Mg Tabs (Ciprofloxacin hcl) ..... One by mouth two times a day x 7 days His updated medication list for this problem includes:    Fexofenadine-pseudoephedrine 60-120 Mg Xr12h-tab (Fexofenadine-pseudoephedrine) ..... One tab two times a day    Azithromycin 250 Mg Tabs (Azithromycin) .[name]... 2 by mouth today then one by mouth once daily for 4 days.  Complete Medication List: 1)  Onetouch Ultra Test Strp (Glucose blood) .... As directed two times a day 2)  Metformin Hcl 1000 Mg Tabs (Metformin hcl) .... One tab two times a day 3)  Januvia 100 Mg Tabs (Sitagliptin phosphate) .... Once daily 4)  Fexofenadine-pseudoephedrine 60-120 Mg Xr12h-tab (Fexofenadine-pseudoephedrine) .... One tab two times a day 5)  Fenofibrate 160 Mg Tabs (Fenofibrate) .... Once daily 6)  Simvastatin 80 Mg Tabs (Simvastatin) .... Once daily 7)  Ramipril 10 Mg Caps (Ramipril) .... One by mouth once daily 8)  Aspirin Adult Low Strength 81 Mg Tbec (Aspirin) .... Two times a day 9)  Vitamin B-12 Cr 2000 Mcg Cr-tabs (Cyanocobalamin) .... Once daily 10)  Cvs Vitamin E 1000 Unit Caps (Vitamin e) .... Once daily 11)  Co-q 10 Omega-3 Fish Oil Caps (Coenzyme q10-fish oil-vit e) .... Once daily 12)  Cinnamon 500 Mg Caps (Cinnamon) .... Two times a day 13)  Garlic 123XX123 Mg Caps (Garlic) .... Two times a day 14)  Onetouch Ultra Mini W/device Kit (Blood glucose monitoring suppl) .... Testing two times a day \\par  15)  Flavoxate Hcl 100 Mg Tabs (Flavoxate hcl) .... One by mouth three times a day prn 16)  Silvadene 1 % Crea (Silver sulfadiazine) .... Apply to affected burn daily 17)  Actos 45 Mg  Tabs (Pioglitazone hcl) .... One po once daily 18)  Lantus Solostar 100 Unit/ml Soln (Insulin glargine) .... Use as directed once daily 19)  Bd Pen Needle Mini U/f 31g X 5 Mm Misc (Insulin pen needle) .... Daily as directed 20)  Azithromycin 250 Mg Tabs (Azithromycin) .... 2 by mouth today then one by mouth once daily for 4 days.  Patient Instructions: 1)  Acute Bronchitis symptoms for less then 10 days are not  helped by antibiotics. Take over the counter cough medications. Call if no improvement in 5-7 days, sooner if increasing cough, fever, or new symptoms ( shortness of breath, chest pain) .  2)  Please schedule a follow-up appointment in 3 months .  Prescriptions: AZITHROMYCIN 250 MG TABS (AZITHROMYCIN) 2 by mouth today then one by mouth once daily for 4 days.  #6 x 0   Entered and Authorized by:   Carolann Littler MD   Signed by:   Carolann Littler MD on 06/12/2009   Method used:   Electronically to        [facility] (retail)       [place] [address]       [place], [place]  [address]       Ph: [phone]       Fax: [phone]   RxID:   [id number]

## 2010-02-09 NOTE — Progress Notes (Signed)
Summary: Call-A-Nurse Report    Call-A-Nurse Triage Call Report Triage Record Num: [id number] Operator: Trinna Balloon Patient Name: Anthony Bullock Call Date & Time: 09/26/2009 11:00:20AM Patient Phone: [phone] PCP: Carolann Littler Patient Gender: Male PCP Fax : [phone] Patient DOB: [birth year] Practice Name: [facility] Reason for Call: On Cipro for UTI x 3 d, vomiting x 2 d, flank pain has not been relieved w/abx. Temp 101 w/o ASA. Last void 1030. Appt sched for 1200 today. Protocol(s) Used: Flank Pain Protocol(s) Used: Urinary Symptoms - Male Recommended Outcome per Protocol: See Provider within 4 hours Reason for Outcome: Flank pain Flank pain or low back pain AND urinary tract symptoms Care Advice:  ~ 09/26/2009 11:09:53AM Page 1 of 1 [id number]

## 2010-02-09 NOTE — Assessment & Plan Note (Signed)
Summary: FU ON DM/NJR   Vital Signs:  Patient profile:   50 year old male Height:      71.5 inches Weight:      210 pounds BMI:     28.99 Temp:     97.2 degrees F oral BP sitting:   140 / 70  (left arm) Cuff size:   regular  Vitals Entered By: Nira Conn LPN (April  5, 624THL [id number] AM) CC: Anthony Bullock, to establish, diabetic, fasting am BS 129   History of Present Illness: Patient is a 50 year old gentleman seen to establish care. His past medical problems include history of type 2 diabetes, dyslipidemia, hypertension, and seasonal allergies. He has had history of recurrent urethral  obstruction and had recent balloon dilation procedure. 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 Since balloon dilation back in March he is doing well no obstructive symptoms.  Patient had suboptimally controlled diabetes has been reluctant to go on insulin the past. Last hemoglobin A1c 8.9% in January of 2010. Since that time though he is makes him changes in his diet and his fasting blood sugars are slightly improved mostly around 120-140. No hypoglycemic symptoms. He had no intolerance to medications. Patient denies any recent problems with headaches, dizziness, chest pain, or shortness of breath. He has seasonal allergies which have caused some recent nasal congestion but these are usually well controlled with fexofenadine.  Preventive Screening-Counseling & Management     Smoking Status: current     Smoking Cessation Counseling: YES     Does Patient Exercise: no  Past History:  Past Medical History:    Allergic rhinitis    Diabetes mellitus, type II    Hyperlipidemia    Hypertension    Urrethral stricture  Past Surgical History:    Repeat urethral surgery for recurrent obstruction  Family History:    Family History of Alcoholism/Addiction    heart disease    stroke    type 2 dm  Social History:    Occupation:     Married    Current Smoker    Alcohol use-no    Regular exercise-no    Occupation:  employed    Smoking Status:  current    Does Patient Exercise:  no  Review of Systems  The patient denies anorexia, weight loss, weight gain, vision loss, chest pain, syncope, dyspnea on exertion, peripheral edema, prolonged cough, headaches, hemoptysis, abdominal pain, melena, hematochezia, and hematuria.    Physical Exam  General:  alert and in no distress Ears:  External ear exam shows no significant lesions or deformities.  Otoscopic examination reveals clear canals, tympanic membranes are intact bilaterally without bulging, retraction, inflammation or discharge. Hearing is grossly normal bilaterally. Mouth:  Oral mucosa and oropharynx without lesions or exudates.  Teeth in good repair. Neck:  No deformities, masses, or tenderness noted. Lungs:  Normal respiratory effort, chest expands symmetrically. Lungs are clear to auscultation, no crackles or wheezes. Heart:  Normal rate and regular rhythm. S1 and S2 normal without gallop, murmur, click, rub or other extra sounds. Pulses:  dorsalis pedis and posterior tibial pulses are normal Extremities:  no edema noted. Neurologic:  normal sensory function to monofilament testing.   Impression & Recommendations:  Problem # 1:  DIABETES MELLITUS, TYPE II (ICD-250.00)  History poor control. Reassess hemoglobin A1c today. Prescription for new home glucose monitor. His updated medication list for this problem  includes:    Metformin Hcl 1000 Mg Tabs (Metformin hcl) ..... One tab two times a day    Januvia 100 Mg Tabs (Sitagliptin phosphate) ..... Once daily    Ramipril 5 Mg Caps (Ramipril) ..... Once daily    Aspirin Adult Low Strength 81 Mg Tbec (Aspirin) .[name]..[name] Two times a day  Orders: TLB-A1C / Hgb A1C (Glycohemoglobin) (83036-A1C) Venipuncture [id number])  Problem # 2:  HYPERTENSION (ICD-401.9)   work on weight loss.  Monitor and consider increase  Ramipril next visit if not to goal. His updated medication list for this problem includes:    Ramipril 5 Mg Caps (Ramipril) ..... Once daily  Orders: TLB-BMP (Basic Metabolic Panel-BMET) (99991111) Venipuncture [id number])  Problem # 3:  HYPERLIPIDEMIA (ICD-272.4)  reassess fasting lipid today and hepatic panel.is updated medication list for this problem includes:    Fenofibrate 160 Mg Tabs (Fenofibrate) ..... Once daily    Simvastatin 80 Mg Tabs (Simvastatin) ..... Once daily  Orders: TLB-Lipid Panel (80061-LIPID) TLB-Hepatic/Liver Function Pnl (80076-HEPATIC) Venipuncture [id number])  Problem # 4:  ALLERGIC RHINITIS (ICD-477.9) Assessment: Comment Only  Complete Medication List: 1)  Onetouch Ultra Test Strp (Glucose blood) .... As directed two times a day 2)  Metformin Hcl 1000 Mg Tabs (Metformin hcl) .... One tab two times a day 3)  Januvia 100 Mg Tabs (Sitagliptin phosphate) .... Once daily 4)  Fexofenadine-pseudoephedrine 60-120 Mg Xr12h-tab (Fexofenadine-pseudoephedrine) .... One tab two times a day 5)  Fenofibrate 160 Mg Tabs (Fenofibrate) .... Once daily 6)  Simvastatin 80 Mg Tabs (Simvastatin) .... Once daily 7)  Ramipril 5 Mg Caps (Ramipril) .... Once daily 8)  Aspirin Adult Low Strength 81 Mg Tbec (Aspirin) .... Two times a day 9)  Vitamin B-12 Cr 2000 Mcg Cr-tabs (Cyanocobalamin) .... Once daily 10)  Cvs Vitamin E 1000 Unit Caps (Vitamin e) .... Once daily 11)  Co-q 10 Omega-3 Fish Oil Caps (Coenzyme q10-fish oil-vit e) .... Once daily 12)  Cinnamon 500 Mg Caps (Cinnamon) .... Two times a day 13)  Garlic 123XX123 Mg Caps (Garlic) .... Two times a day 14)  Onetouch Ultra Mini W/device Kit (Blood glucose monitoring suppl) .... Testing two times a day \\par  Patient Instructions: 1)  Tobacco is very bad for your health and your loved ones ! You should stop smoking !  2)  Stop smoking tips: Choose a quit date. Cut down before the quit date. Decide what you will do as a  substitute when you feel the urge to smoke(gum, toothpick, exercise).  3)  It is important that you exercise reguarly at least 20 minutes 5 times a week. If you develop chest pain, have severe difficulty breathing, or feel very tired, stop exercising immediately and seek medical attention.  4)  Check your blood sugars regularly. If your readings are usually above:  or below 70 you should contact our office.  5)  It is important that your diabetic A1c level is checked every 3 months.  6)  See your eye doctor yearly to check for diabetic eye damage. 7)  Check your feet each night  for sore areas, calluses or signs of infection.  8)  Please schedule a follow-up appointment in 3 months .  Prescriptions: ONETOUCH ULTRA MINI W/DEVICE KIT (BLOOD GLUCOSE MONITORING SUPPL) testing two times a day  #1 x 0   Entered by:   Nira Conn LPN   Authorized by:   Carolann Littler MD   Signed by:   Nira Conn LPN on [id number]   Method  used:   Print then Give to Patient   RxID:   [id number]

## 2010-02-09 NOTE — Assessment & Plan Note (Signed)
Summary: UTI   Vital Signs:  Patient profile:   Anthony year old male Height:      71.5 inches Weight:      186 pounds BMI:     25.67 Temp:     97.9 degrees F oral Pulse rate:   116 / minute Pulse rhythm:   regular Resp:     16 per minute BP sitting:   126 / 80  (left arm) Cuff size:   regular  Vitals Entered By: Jonathon Resides, Gabrielle Dare) (September 26, 2009 12:43 PM) CC: N&V from abx he was given on thurs for UTI, LBP Is Patient Diabetic? Yes   Primary Care Provider:  Carolann Littler MD  CC:  N&V from abx he was given on thurs for UTI and LBP.  History of Present Illness: Anthony Bullock presents for UTI.  he was just seen 3 days ago and was given Cipro which has cuased N/V.  He is having severe L flank pain.  He denies gross hematuria.  His flank pain is constant.  Denies any dysuria.  He has had fevers and chills.  It has been several years since he's had a UTI.  His N/V has been right after taking the Cipro.      Current Medications (verified): 1)  Onetouch Ultra Test  Strp (Glucose Blood) .... As Directed Two Times A Day 2)  Metformin Hcl 1000 Mg Tabs (Metformin Hcl) .... One Tab Two Times A Day 3)  Januvia 100 Mg Tabs (Sitagliptin Phosphate) .... Once Daily 4)  Fexofenadine-Pseudoephedrine 60-120 Mg Xr12h-Tab (Fexofenadine-Pseudoephedrine) .... One Tab Two Times A Day 5)  Fenofibrate 160 Mg Tabs (Fenofibrate) .... Once Daily 6)  Simvastatin 80 Mg Tabs (Simvastatin) .... Once Daily 7)  Ramipril 10 Mg Caps (Ramipril) .... One By Mouth Once Daily 8)  Aspirin Adult Low Strength 81 Mg Tbec (Aspirin) .... Two Times A Day 9)  Vitamin B-12 Cr 2000 Mcg Cr-Tabs (Cyanocobalamin) .... Once Daily 10)  Cvs Vitamin E 1000 Unit Caps (Vitamin E) .... Once Daily 11)  Co-Q 10 Omega-3 Fish Oil  Caps (Coenzyme Q10-Fish Oil-Vit E) .... Once Daily 12)  Cinnamon 500 Mg Caps (Cinnamon) .... Two Times A Day 13)  Garlic 123XX123 Mg Caps (Garlic) .... Two Times A Day 14)  Onetouch Ultra Mini W/device Kit  (Blood Glucose Monitoring Suppl) .... Testing Two Times A Day \\par  15)  Flavoxate Hcl 100 Mg Tabs (Flavoxate Hcl) .... One By Mouth Three Times A Day Prn 16)  Silvadene 1 % Crea (Silver Sulfadiazine) .... Apply To Affected Burn Daily 17)  Actos 45 Mg Tabs (Pioglitazone Hcl) .... One Po Once Daily 18)  Lantus Solostar 100 Unit/ml Soln (Insulin Glargine) .... Use As Directed Once Daily 19)  Bd Pen Needle Mini U/f 31g X 5 Mm Misc (Insulin Pen Needle) .... Daily As Directed 20)  Azithromycin 250 Mg Tabs (Azithromycin) .... 2 By Mouth Today Then One By Mouth Once Daily For 4 Days. 21)  Sertraline Hcl 50 Mg Tabs (Sertraline Hcl) .... One By Mouth Once Daily 22)  Nystatin 100000 Unit/gm Crea (Nystatin) .... Apply To Affected Rash Two Times A Day 23)  Ciprofloxacin Hcl 500 Mg Tabs (Ciprofloxacin Hcl) .... One By Mouth Two Times A Day For 7 Days  Allergies (verified): 1)  ! Morphine Sulfate Cr (Morphine Sulfate)  Past History:  Past Medical History: Reviewed history from 09/24/2009 and no changes required. Allergic rhinitis Diabetes mellitus, type II Hyperlipidemia Hypertension Urrethral stricture Hx kidney stones.  Past  Surgical History: Reviewed history from 04/14/2008 and no changes required. Repeat urethral surgery for recurrent obstruction  Social History: Reviewed history from 04/14/2008 and no changes required. Occupation: Married Current Smoker Alcohol use-no Regular exercise-no  Review of Systems      See HPI  Physical Exam  General:  alert, well-developed, well-nourished, and well-hydrated.   Mouth:  o/p pink, sightly dry Neck:  no masses.   Lungs:  Normal respiratory effort, chest expands symmetrically. Lungs are clear to auscultation, no crackles or wheezes. Heart:  no murmur and tachycardia.   Abdomen:  L flank very ttp no suprapubic TTP Extremities:  no LE edema Skin:  color normal.   Psych:  good eye contact, not anxious appearing, and not depressed appearing.      Impression & Recommendations:  Problem # 1:  UTI (ICD-599.0) N/V from Cipro.  Changed to Bactrim DS and added Promethazine with Codiene for N/V and flank pain.   Clear fluids, rest and call PCP if not starting to improve in 48 hrs.  Watch for mental status change, vomiting, T> 101. If unable to keep down this round of abx with supportive meds, suggest hospitaliization for pyelo. His updated medication list for this problem includes:    Flavoxate Hcl 100 Mg Tabs (Flavoxate hcl) ..... One by mouth three times a day prn    Azithromycin 250 Mg Tabs (Azithromycin) .[name]... 2 by mouth today then one by mouth once daily for 4 days.    Bactrim Ds 800-160 Mg Tabs (Sulfamethoxazole-trimethoprim) .[name]... 1 tab by mouth two times a day with food x 7 days  Complete Medication List: 1)  Onetouch Ultra Test Strp (Glucose blood) .... As directed two times a day 2)  Metformin Hcl 1000 Mg Tabs (Metformin hcl) .... One tab two times a day 3)  Januvia 100 Mg Tabs (Sitagliptin phosphate) .... Once daily 4)  Fexofenadine-pseudoephedrine 60-120 Mg Xr12h-tab (Fexofenadine-pseudoephedrine) .... One tab two times a day 5)  Fenofibrate 160 Mg Tabs (Fenofibrate) .... Once daily 6)  Simvastatin 80 Mg Tabs (Simvastatin) .... Once daily 7)  Ramipril 10 Mg Caps (Ramipril) .... One by mouth once daily 8)  Aspirin Adult Low Strength 81 Mg Tbec (Aspirin) .... Two times a day 9)  Vitamin B-12 Cr 2000 Mcg Cr-tabs (Cyanocobalamin) .... Once daily 10)  Cvs Vitamin E 1000 Unit Caps (Vitamin e) .... Once daily 11)  Co-q 10 Omega-3 Fish Oil Caps (Coenzyme q10-fish oil-vit e) .... Once daily 12)  Cinnamon 500 Mg Caps (Cinnamon) .... Two times a day 13)  Garlic 123XX123 Mg Caps (Garlic) .... Two times a day 14)  Onetouch Ultra Mini W/device Kit (Blood glucose monitoring suppl) .... Testing two times a day \\par  15)  Flavoxate Hcl 100 Mg Tabs (Flavoxate hcl) .... One by mouth three times a day prn 16)  Silvadene 1 % Crea (Silver  sulfadiazine) .... Apply to affected burn daily 17)  Actos 45 Mg Tabs (Pioglitazone hcl) .... One po once daily 18)  Lantus Solostar 100 Unit/ml Soln (Insulin glargine) .... Use as directed once daily 19)  Bd Pen Needle Mini U/f 31g X 5 Mm Misc (Insulin pen needle) .... Daily as directed 20)  Azithromycin 250 Mg Tabs (Azithromycin) .... 2 by mouth today then one by mouth once daily for 4 days. 21)  Sertraline Hcl 50 Mg Tabs (Sertraline hcl) .... One by mouth once daily 22)  Nystatin 100000 Unit/gm Crea (Nystatin) .... Apply to affected rash two times a day 23)  Bactrim Ds  800-160 Mg Tabs (Sulfamethoxazole-trimethoprim) .[name].. 1 tab by mouth two times a day with food x 7 days 24)  Promethazine-codeine 6.25-10 Mg/76ml Syrp (Promethazine-codeine) .... 5-10 ml by mouth q 6 hrs as needed pain/ nausea  Patient Instructions: 1)  Change Cipro to Bactrim DS for kidney infection. 2)  Take Phenergan with Codeine 20 min before dose of anbiotics and eat a light snack while taking meds. 3)  If not improving in 48 hrs, call your PCP. Prescriptions: BACTRIM DS 800-160 MG TABS (SULFAMETHOXAZOLE-TRIMETHOPRIM) 1 tab by mouth two times a day with food x 7 days  #14 x 0   Entered and Authorized by:   Loyal Gambler DO   Signed by:   Loyal Gambler DO on 09/26/2009   Method used:   Electronically to        [facility] (retail)       [address]       [place], [place]  [address]       Ph: [phone]       Fax: [phone]   RxID:   [id number] PROMETHAZINE-CODEINE 6.25-10 MG/5ML SYRP (PROMETHAZINE-CODEINE) 5-10 ml by mouth q 6 hrs as needed pain/ nausea  #200 ml x 0   Entered and Authorized by:   Loyal Gambler DO   Signed by:   Loyal Gambler DO on 09/26/2009   Method used:   Printed then faxed to ...       [facility]  [address] (retail)       [place], [place]  [address]       Ph: [phone]       Fax: [phone]   RxID:   [id number]

## 2010-02-09 NOTE — Letter (Signed)
Summary: Diabetic Eye Exam/[facility]  Diabetic [place]   Imported By: Laural Benes 10/22/2009 13:56:34  _____________________________________________________________________  External Attachment:    Type:   Image     Comment:   External Document

## 2010-02-09 NOTE — Letter (Signed)
Summary: Out of Work  [facility]   [place], [place] [address]   Phone: [phone]  Fax: [phone]    October 05, 2009   Employee:  [employer] [facility] [employer]    To Whom It May Concern:   For Medical reasons, please excuse the above named employee from work for the following dates:  Start:   09-24-09  End:   10-09-09  If you need additional information, please feel free to contact our office.         Sincerely,    Carolann Littler MD

## 2010-02-09 NOTE — Assessment & Plan Note (Signed)
Summary: DM FUP---WILL FAST//CCM   Vital Signs:  Patient profile:   50 year old male Weight:      214 pounds BMI:     29.54 Temp:     98 degrees F oral BP sitting:   140 / 84  (left arm) Cuff size:   large  Vitals Entered By: Nira Conn LPN (February  9, 624THL 8:25 AM)  Nutrition Counseling: Patient's BMI is greater than 25 and therefore counseled on weight management options. CC: Diabetes follow-up, med refills, Hypertension Management Is Patient Diabetic? Yes Did you bring your meter with you today? No   History of Present Illness: Followup multiple medical problems.  Diabetes poor control. Recent A1c over 10%. Fasting blood sugar is 250. Symptoms of urine frequency and thirst. No weight loss. He is finally accepting of the fact that he'll need insulin. Currently on 3 drug regimen of metformin, Januvia, and Actos. Due for eye exam.  History hypertension treated with ramipril. No recent dizziness. No cough or other side effect.  Allergic rhinitis history with perennial allergic rhinitis. Needs refills of fexofenadine  Diabetes Management History:      He has not been enrolled in the "Diabetic Education Program".  He states understanding of dietary principles but he is not following the appropriate diet.  No sensory loss is reported.  Self foot exams are being performed.  He is checking home blood sugars.  He says that he is not exercising regularly.        Hypoglycemic symptoms are not occurring.  Hyperglycemic symptoms include polyuria and polydipsia.    Hypertension History:      He denies headache, chest pain, palpitations, dyspnea with exertion, orthopnea, PND, peripheral edema, visual symptoms, neurologic problems, syncope, and side effects from treatment.  He notes no problems with any antihypertensive medication side effects.        Positive major cardiovascular risk factors include male age 65 years old or older, diabetes, hyperlipidemia, hypertension, and current  tobacco user.    Preventive Screening-Counseling & Management  Alcohol-Tobacco     Smoking Status: current  Allergies: 1)  ! Morphine Sulfate Cr (Morphine Sulfate)  Past History:  Past Medical History: Last updated: 04/14/2008 Allergic rhinitis Diabetes mellitus, type II Hyperlipidemia Hypertension Urrethral stricture  Social History: Last updated: 04/14/2008 Occupation: Married Current Smoker Alcohol use-no Regular exercise-no PMH reviewed for relevance  Review of Systems       The patient complains of weight gain.  The patient denies anorexia, fever, weight loss, vision loss, chest pain, syncope, dyspnea on exertion, peripheral edema, prolonged cough, headaches, hemoptysis, abdominal pain, melena, hematochezia, severe indigestion/heartburn, and hematuria.    Physical Exam  General:  Well-developed,well-nourished,in no acute distress; alert,appropriate and cooperative throughout examination Ears:  External ear exam shows no significant lesions or deformities.  Otoscopic examination reveals clear canals, tympanic membranes are intact bilaterally without bulging, retraction, inflammation or discharge. Hearing is grossly normal bilaterally. Mouth:  Oral mucosa and oropharynx without lesions or exudates.  Teeth in good repair. Neck:  No deformities, masses, or tenderness noted. Lungs:  Normal respiratory effort, chest expands symmetrically. Lungs are clear to auscultation, no crackles or wheezes. Heart:  Normal rate and regular rhythm. S1 and S2 normal without gallop, murmur, click, rub or other extra sounds. Extremities:  No clubbing, cyanosis, edema, or deformity noted with normal full range of motion of all joints.   Skin:  burn left forearm fully healed  Diabetes Management Exam:    Foot Exam (with socks  and/or shoes not present):       Sensory-Pinprick/Light touch:          Left medial foot (L-4): normal          Left dorsal foot (L-5): normal          Left lateral  foot (S-1): normal          Right medial foot (L-4): normal          Right dorsal foot (L-5): normal          Right lateral foot (S-1): normal       Sensory-Monofilament:          Left foot: normal          Right foot: normal       Inspection:          Left foot: normal          Right foot: normal       Nails:          Left foot: normal          Right foot: normal    Eye Exam:       Eye Exam done elsewhere          Date: 02/11/2008          Results: normal          Done by: [facility] [place]   Impression & Recommendations:  Problem # 1:  DIABETES MELLITUS, TYPE II (ICD-250.00) Assessment Deteriorated Start lantus with sample given and pt instructed for use.  Start 10 units once daily and titration regimen given. His updated medication list for this problem includes:    Metformin Hcl 1000 Mg Tabs (Metformin hcl) ..... One tab two times a day    Januvia 100 Mg Tabs (Sitagliptin phosphate) ..... Once daily    Ramipril 10 Mg Caps (Ramipril) ..... One by mouth once daily    Aspirin Adult Low Strength 81 Mg Tbec (Aspirin) .[name]..[name] Two times a day    Actos 45 Mg Tabs (Pioglitazone hcl) ..... One po once daily    Lantus Solostar 100 Unit/ml Soln (Insulin glargine) ..... Use as directed once daily  Orders: TLB-Microalbumin/Creat Ratio, Urine (82043-MALB)  Problem # 2:  HYPERTENSION (ICD-401.9) Assessment: Deteriorated BP not to goal. His updated medication list for this problem includes:    Ramipril 10 Mg Caps (Ramipril) ..... One by mouth once daily  Orders: TLB-BMP (Basic Metabolic Panel-BMET) (99991111)  Problem # 3:  HYPERLIPIDEMIA (ICD-272.4)  His updated medication list for this problem includes:    Fenofibrate 160 Mg Tabs (Fenofibrate) ..... Once daily    Simvastatin 80 Mg Tabs (Simvastatin) ..... Once daily  Orders: TLB-Lipid Panel (80061-LIPID) TLB-Hepatic/Liver Function Pnl (80076-HEPATIC)  Problem # 4:  ALLERGIC RHINITIS (ICD-477.9)  Complete  Medication List: 1)  Onetouch Ultra Test Strp (Glucose blood) .... As directed two times a day 2)  Metformin Hcl 1000 Mg Tabs (Metformin hcl) .... One tab two times a day 3)  Januvia 100 Mg Tabs (Sitagliptin phosphate) .... Once daily 4)  Fexofenadine-pseudoephedrine 60-120 Mg Xr12h-tab (Fexofenadine-pseudoephedrine) .... One tab two times a day 5)  Fenofibrate 160 Mg Tabs (Fenofibrate) .... Once daily 6)  Simvastatin 80 Mg Tabs (Simvastatin) .... Once daily 7)  Ramipril 10 Mg Caps (Ramipril) .... One by mouth once daily 8)  Aspirin Adult Low Strength 81 Mg Tbec (Aspirin) .... Two times a day 9)  Vitamin B-12 Cr 2000 Mcg Cr-tabs (Cyanocobalamin) .... Once daily 10)  Cvs Vitamin E 1000 Unit Caps (Vitamin e) .... Once daily 11)  Co-q 10 Omega-3 Fish Oil Caps (Coenzyme q10-fish oil-vit e) .... Once daily 12)  Cinnamon 500 Mg Caps (Cinnamon) .... Two times a day 13)  Garlic 123XX123 Mg Caps (Garlic) .... Two times a day 14)  Onetouch Ultra Mini W/device Kit (Blood glucose monitoring suppl) .... Testing two times a day \\par  15)  Cipro 500 Mg Tabs (Ciprofloxacin hcl) .... One by mouth two times a day x 7 days 16)  Flavoxate Hcl 100 Mg Tabs (Flavoxate hcl) .... One by mouth three times a day prn 17)  Silvadene 1 % Crea (Silver sulfadiazine) .... Apply to affected burn daily 18)  Actos 45 Mg Tabs (Pioglitazone hcl) .... One po once daily 19)  Lantus Solostar 100 Unit/ml Soln (Insulin glargine) .... Use as directed once daily 20)  Bd Pen Needle Mini U/f 31g X 5 Mm Misc (Insulin pen needle) .... Daily as directed  Hypertension Assessment/Plan:      The patient's hypertensive risk group is category C: Target organ damage and/or diabetes.  His calculated 10 year risk of coronary heart disease is 27 %.  Today's blood pressure is 140/84.    Patient Instructions: 1)  Start Lantus 10 units once daily 2)  Titrate by 2 units every other day for fasting blood sugars greater than 130 3)  Please schedule a  follow-up appointment in 1 month.  Prescriptions: BD PEN NEEDLE MINI U/F 31G X 5 MM MISC (INSULIN PEN NEEDLE) daily as directed  #100 x 0   Entered by:   Nira Conn LPN   Authorized by:   Carolann Littler MD   Signed by:   Nira Conn LPN on 624THL   Method used:   Electronically to        [facility] (retail)       [address]       [place], [place]  [address]       Ph: [phone]       Fax: [phone]   [id number] BD PEN NEEDLE MINI U/F 31G X 5 MM MISC (INSULIN PEN NEEDLE) daily as directed  #100 x 3   Entered by:   Nira Conn LPN   Authorized by:   Carolann Littler MD   Signed by:   Nira Conn LPN on 624THL   Method used:   Electronically to        [facility] (mail-order)             ,          Ph: [phone]       Fax: [phone]   [id number] ACTOS 45 MG TABS (PIOGLITAZONE HCL) one po once daily  #90 x 3   Entered and Authorized by:   Carolann Littler MD   Signed by:   Carolann Littler MD on 02/18/2009   Method used:   Electronically to        [facility] (mail-order)             ,          Ph: [phone]       Fax: [phone]   [id number] RAMIPRIL 10 MG CAPS (RAMIPRIL) one by mouth once daily  #90 x 3   Entered and Authorized by:   Carolann Littler MD   Signed by:   Carolann Littler  MD on 02/18/2009   Method used:   Electronically to        [facility] (mail-order)             ,          Ph: [phone]       Fax: [phone]   RxID:   [id number] SIMVASTATIN 80 MG TABS (SIMVASTATIN) once daily  #90 x 3   Entered and Authorized by:   Carolann Littler MD   Signed by:   Carolann Littler MD on 02/18/2009   Method used:   Electronically to        [facility] (retail)       [address]       [place], [place]  [address]       Ph: [phone]       Fax: [phone]   RxID:   [id number] FENOFIBRATE 160 MG TABS (FENOFIBRATE) once  daily  #90 x 3   Entered and Authorized by:   Carolann Littler MD   Signed by:   Carolann Littler MD on 02/18/2009   Method used:   Electronically to        [facility] (retail)       [address]       [place], [place]  [address]       Ph: [phone]       Fax: [phone]   [id number] FEXOFENADINE-PSEUDOEPHEDRINE 60-120 MG XR12H-TAB (FEXOFENADINE-PSEUDOEPHEDRINE) one tab two times a day  #180 x 3   Entered and Authorized by:   Carolann Littler MD   Signed by:   Carolann Littler MD on 02/18/2009   Method used:   Electronically to        [facility] (retail)       [address]       [place], [place]  [address]       Ph: [phone]       Fax: [phone]   RxIDOC:[id number] JANUVIA 100 MG TABS (SITAGLIPTIN PHOSPHATE) once daily  #90 x 3   Entered and Authorized by:   Carolann Littler MD   Signed by:   Carolann Littler MD on 02/18/2009   Method used:   Electronically to        [facility] (retail)       [address]       [place], [place]  [address]       Ph: [phone]       Fax: [phone]   [id number] METFORMIN HCL 1000 MG TABS (METFORMIN HCL) one tab two times a day  #180 x 3   Entered and Authorized by:   Carolann Littler MD   Signed by:   Carolann Littler MD on 02/18/2009   Method used:   Electronically to        [facility] (retail)       [address]       [place],   [address]       Ph: [phone]       Fax: [phone]   RxID:   [id number] ONETOUCH ULTRA TEST  STRP (GLUCOSE BLOOD) as  directed two times a day  #180 x 3   Entered and Authorized by:   Carolann Littler MD   Signed by:   Carolann Littler MD on 02/18/2009   Method used:   Electronically to        [facility] (retail)       [address]       [place], [place]  [address]       Ph: [phone]       Fax: [phone]   RxID:    [id number] LANTUS SOLOSTAR 100 UNIT/ML SOLN (INSULIN GLARGINE) use as directed once daily  #1 pen x 5   Entered and Authorized by:   Carolann Littler MD   Signed by:   Carolann Littler MD on 02/18/2009   Method used:   Electronically to        [facility] (retail)       [address]       [place], [place]  [address]       Ph: [phone]       Fax: [phone]   RxID:   [id number]

## 2010-02-09 NOTE — Letter (Signed)
Summary: [facility]   Imported By: Laural Benes 05/19/2008 12:31:35  _____________________________________________________________________  External Attachment:    Type:   Image     Comment:   External Document

## 2010-02-09 NOTE — Letter (Signed)
Summary: Out of Work  [facility]   [place], [place] [address]   Phone: [phone]  Fax: [phone]    August 25, 2009   Employee:  DEVERON [employer] [facility]    To Whom It May Concern:   For Medical reasons, please excuse the above named employee from work for the following dates:  Start:   08/24/2009  End:   08/25/2009, may RTW 08/26/2009  If you need additional information, please feel free to contact our office.         Sincerely,      Carolann Littler, MD

## 2010-02-09 NOTE — Letter (Signed)
Summary: [facility]  [facility]   Imported By: Laural Benes 10/22/2009 14:43:20  _____________________________________________________________________  External Attachment:    Type:   Image     Comment:   External Document

## 2010-02-09 NOTE — Assessment & Plan Note (Signed)
Summary: 3 month fup//ccm----PT [facility] // RS   Vital Signs:  Patient profile:   50 year old male Weight:      205 pounds Temp:     98.0 degrees F oral BP sitting:   124 / 70  (left arm) Cuff size:   regular  Vitals Entered By: Cay Schillings LPN (August 12, 624THL 8:38 AM) CC: 3 mos rov - doing ok Is Patient Diabetic? Yes Did you bring your meter with you today? No CBG Result 351   History of Present Illness: Patient seen regarding the following  type 2 diabetes. Very poor compliance. Recently taking only his metformin. Not taking Actos, Januvia, or Lantus. Poor compliance not secondary to cost issues. He has developed depression following loss of his mother and has simply not been taking medications. Not monitoring blood sugars. Symptoms of urine frequency, thirst, and weight loss. Fasting blood sugar this morning here 351.  New problem of pruritic, whitish discharge distal shaft of penis. Has tried cornstarch and Desitin without improvement.  Depression symptoms for several weeks now following loss of mother. Very supportive spouse. He has difficulty concentrating, frequent crying spells, decreased sleep and anhedonia. No suicidal ideation. No prior history of depression.  Preventive Screening-Counseling & Management  Alcohol-Tobacco     Smoking Status: current  Allergies: 1)  ! Morphine Sulfate Cr (Morphine Sulfate)  Past History:  Past Medical History: Last updated: 04/14/2008 Allergic rhinitis Diabetes mellitus, type II Hyperlipidemia Hypertension Urrethral stricture PMH reviewed for relevance  Review of Systems       The patient complains of weight loss and depression.  The patient denies anorexia, fever, vision loss, chest pain, syncope, dyspnea on exertion, peripheral edema, prolonged cough, headaches, hemoptysis, and abdominal pain.    Physical Exam  General:  patient is alert tearful but cooperative in no distress Head:  Normocephalic and atraumatic  without obvious abnormalities. No apparent alopecia or balding. Mouth:  Oral mucosa and oropharynx without lesions or exudates.  Teeth in good repair. Neck:  No deformities, masses, or tenderness noted. Lungs:  Normal respiratory effort, chest expands symmetrically. Lungs are clear to auscultation, no crackles or wheezes. Heart:  normal rate, regular rhythm, and no murmur.   Genitalia:  patient has a whitish discharge around the distal penile shaft and proximal glans. No urethral discharge Extremities:  No clubbing, cyanosis, edema, or deformity noted with normal full range of motion of all joints.   Psych:  not anxious appearing, depressed affect, and tearful.     Impression & Recommendations:  Problem # 1:  ADJUSTMENT DISORDER WITH DEPRESSED MOOD (ICD-309.0) Assessment New discussed counseling.  Start Sertraline 50 mg daily and offic follow up one month.  Problem # 2:  DIABETES MELLITUS, TYPE II (ICD-250.00) Assessment: Deteriorated POOR compliance.  Pt is strongly encouraged to get back on all meds. His updated medication list for this problem includes:    Metformin Hcl 1000 Mg Tabs (Metformin hcl) ..... One tab two times a day    Januvia 100 Mg Tabs (Sitagliptin phosphate) ..... Once daily    Ramipril 10 Mg Caps (Ramipril) ..... One by mouth once daily    Aspirin Adult Low Strength 81 Mg Tbec (Aspirin) .[name]..[name] Two times a day    Actos 45 Mg Tabs (Pioglitazone hcl) ..... One po once daily    Lantus Solostar 100 Unit/ml Soln (Insulin glargine) ..... Use as directed once daily  Orders: Capillary Blood Glucose/CBG [id number])  Problem # 3:  YEAST BALANITIS (ICD-112.2) nystatin cream.  Work on diabetes control.  Complete Medication List: 1)  Onetouch Ultra Test Strp (Glucose blood) .... As directed two times a day 2)  Metformin Hcl 1000 Mg Tabs (Metformin hcl) .... One tab two times a day 3)  Januvia 100 Mg Tabs (Sitagliptin phosphate) .... Once daily 4)  Fexofenadine-pseudoephedrine  60-120 Mg Xr12h-tab (Fexofenadine-pseudoephedrine) .... One tab two times a day 5)  Fenofibrate 160 Mg Tabs (Fenofibrate) .... Once daily 6)  Simvastatin 80 Mg Tabs (Simvastatin) .... Once daily 7)  Ramipril 10 Mg Caps (Ramipril) .... One by mouth once daily 8)  Aspirin Adult Low Strength 81 Mg Tbec (Aspirin) .... Two times a day 9)  Vitamin B-12 Cr 2000 Mcg Cr-tabs (Cyanocobalamin) .... Once daily 10)  Cvs Vitamin E 1000 Unit Caps (Vitamin e) .... Once daily 11)  Co-q 10 Omega-3 Fish Oil Caps (Coenzyme q10-fish oil-vit e) .... Once daily 12)  Cinnamon 500 Mg Caps (Cinnamon) .... Two times a day 13)  Garlic 123XX123 Mg Caps (Garlic) .... Two times a day 14)  Onetouch Ultra Mini W/device Kit (Blood glucose monitoring suppl) .... Testing two times a day \\par  15)  Flavoxate Hcl 100 Mg Tabs (Flavoxate hcl) .... One by mouth three times a day prn 16)  Silvadene 1 % Crea (Silver sulfadiazine) .... Apply to affected burn daily 17)  Actos 45 Mg Tabs (Pioglitazone hcl) .... One po once daily 18)  Lantus Solostar 100 Unit/ml Soln (Insulin glargine) .... Use as directed once daily 19)  Bd Pen Needle Mini U/f 31g X 5 Mm Misc (Insulin pen needle) .... Daily as directed 20)  Azithromycin 250 Mg Tabs (Azithromycin) .... 2 by mouth today then one by mouth once daily for 4 days. 21)  Sertraline Hcl 50 Mg Tabs (Sertraline hcl) .... One by mouth once daily 22)  Nystatin 100000 Unit/gm Crea (Nystatin) .... Apply to affected rash two times a day  Patient Instructions: 1)  Get back on all of your regular diabetes medications 2)  Please schedule a follow-up appointment in 1 month.  Prescriptions: NYSTATIN 100000 UNIT/GM CREA (NYSTATIN) apply to affected rash two times a day  #15 gm x 1   Entered and Authorized by:   Carolann Littler MD   Signed by:   Carolann Littler MD on 08/21/2009   Method used:   Electronically to        [facility] (retail)       [address]       [place],  [place]  [address]       Ph: [phone]       Fax: [phone]   RxID:   [id number] SERTRALINE HCL 50 MG TABS (SERTRALINE HCL) one by mouth once daily  #30 x 5   Entered and Authorized by:   Carolann Littler MD   Signed by:   Carolann Littler MD on 08/21/2009   Method used:   Electronically to        [facility] (retail)       [address]       [place],   [address]       Ph: [phone]       Fax: [phone]   RxID:   [id number]

## 2010-02-09 NOTE — Letter (Signed)
Summary: [facility]  [facility]   Imported By: Laural Benes 11/26/2009 09:55:52  _____________________________________________________________________  External Attachment:    Type:   Image     Comment:   External Document

## 2010-02-09 NOTE — Assessment & Plan Note (Signed)
Summary: DM PT/BURN ARM/NJR   Vital Signs:  Patient profile:   50 year old male Weight:      216 pounds Pulse rate:   86 / minute BP sitting:   150 / 80  (left arm)  Vitals Entered By: Malachi Bonds (November 13, 2008 4:34 PM) CC: steam burn on L forearm   History of Present Illness: Acute visit.  Burn to left forearm earlier today sec to steam while cooking in kitchen.  Tetanus is up to date.  No other injury.  Pt does have Type 2 diabetes.  Allergies: 1)  ! Morphine Sulfate Cr (Morphine Sulfate)  Past History:  Past Medical History: Last updated: 04/14/2008 Allergic rhinitis Diabetes mellitus, type II Hyperlipidemia Hypertension Urrethral stricture  Physical Exam  General:  Well-developed,well-nourished,in no acute distress; alert,appropriate and cooperative throughout examination Extremities:  left forearm reveals area approx 8 X 14 cm of erythema and tenderness.  No visible disruptions of skin.  no signif wrist or elbow involvement.   Impression & Recommendations:  Problem # 1:  BURN, SECOND DEGREE, FOREARM (ICD-949.2) Assessment New tetanus is up to date.  Dress with Silvadene. Wound care instruction given.  F/U promptly if signs of secondary infection.  Complete Medication List: 1)  Onetouch Ultra Test Strp (Glucose blood) .... As directed two times a day 2)  Metformin Hcl 1000 Mg Tabs (Metformin hcl) .... One tab two times a day 3)  Januvia 100 Mg Tabs (Sitagliptin phosphate) .... Once daily 4)  Fexofenadine-pseudoephedrine 60-120 Mg Xr12h-tab (Fexofenadine-pseudoephedrine) .... One tab two times a day 5)  Fenofibrate 160 Mg Tabs (Fenofibrate) .... Once daily 6)  Simvastatin 80 Mg Tabs (Simvastatin) .... Once daily 7)  Ramipril 5 Mg Caps (Ramipril) .... Once daily 8)  Aspirin Adult Low Strength 81 Mg Tbec (Aspirin) .... Two times a day 9)  Vitamin B-12 Cr 2000 Mcg Cr-tabs (Cyanocobalamin) .... Once daily 10)  Cvs Vitamin E 1000 Unit Caps (Vitamin e) ....  Once daily 11)  Co-q 10 Omega-3 Fish Oil Caps (Coenzyme q10-fish oil-vit e) .... Once daily 12)  Cinnamon 500 Mg Caps (Cinnamon) .... Two times a day 13)  Garlic 123XX123 Mg Caps (Garlic) .... Two times a day 14)  Onetouch Ultra Mini W/device Kit (Blood glucose monitoring suppl) .... Testing two times a day \\par  15)  Cipro 500 Mg Tabs (Ciprofloxacin hcl) .... One by mouth two times a day x 7 days 16)  Flavoxate Hcl 100 Mg Tabs (Flavoxate hcl) .... One by mouth three times a day prn 17)  Silvadene 1 % Crea (Silver sulfadiazine) .... Apply to affected burn daily  Patient Instructions: 1)  Change dressing to burn daily. Continue daily application of Silvadene antibiotic cream. Follow up promptly if he notices any fever or signs of infection such as increased redness around the wound area. Prescriptions: SILVADENE 1 % CREA (SILVER SULFADIAZINE) apply to affected burn daily  #85 gm x 1   Entered and Authorized by:   Carolann Littler MD   Signed by:   Carolann Littler MD on 11/13/2008   Method used:   Print then Give to Patient   RxID:   [id number]

## 2010-02-09 NOTE — Assessment & Plan Note (Signed)
Summary: COUGH / CONGESTION // RS   Vital Signs:  Patient profile:   50 year old male Temp:     98.6 degrees F oral BP sitting:   150 / 80  (left arm) Cuff size:   regular  Vitals Entered By: Nira Conn LPN (November 30, 624THL 4:45 PM)  History of Present Illness: Patient is seen with over one week history of cough productive of green sputum. Cough is especially bothersome at night. No obvious wheezing. Denies fever. No hemoptysis. Long-term smoker. Probably has some early COPD. No relief with over-the-counter medications.  Has made some positive dietary changes with elimination of sodas and blood sugars have been improved.  Allergies: 1)  ! Morphine Sulfate Cr (Morphine Sulfate)  Past History:  Past Medical History: Last updated: 09/24/2009 Allergic rhinitis Diabetes mellitus, type II Hyperlipidemia Hypertension Urrethral stricture Hx kidney stones.  Review of Systems  The patient denies fever, weight loss, hoarseness, chest pain, syncope, dyspnea on exertion, peripheral edema, prolonged cough, and hemoptysis.    Physical Exam  General:  Well-developed,well-nourished,in no acute distress; alert,appropriate and cooperative throughout examination Ears:  External ear exam shows no significant lesions or deformities.  Otoscopic examination reveals clear canals, tympanic membranes are intact bilaterally without bulging, retraction, inflammation or discharge. Hearing is grossly normal bilaterally. Mouth:  Oral mucosa and oropharynx without lesions or exudates.  Teeth in good repair. Neck:  No deformities, masses, or tenderness noted. Lungs:  Normal respiratory effort, chest expands symmetrically. Lungs are clear to auscultation, no crackles or wheezes. Heart:  Normal rate and regular rhythm. S1 and S2 normal without gallop, murmur, click, rub or other extra sounds.   Impression & Recommendations:  Problem # 1:  ACUTE BRONCHITIS (ICD-466.0) given almost 2 week duration of  productive cough and smoking history start Zithromax. Cough suppressant given for nighttime use The following medications were removed from the medication list:    Levaquin 500 Mg Tabs (Levofloxacin) ..... One by mouth once daily    Promethazine-codeine 6.25-10 Mg/2ml Syrp (Promethazine-codeine) .[name]... 5-10 ml by mouth q 6 hrs as needed pain/ nausea His updated medication list for this problem includes:    Fexofenadine-pseudoephedrine 60-120 Mg Xr12h-tab (Fexofenadine-pseudoephedrine) ..... One tab two times a day    Azithromycin 250 Mg Tabs (Azithromycin) .[name]... 2 by mouth today then one by mouth once daily for 4 days    Hydrocodone-homatropine 5-1.5 Mg/22ml Syrp (Hydrocodone-homatropine) ..... One tsp by mouth q 4-6 hours as needed cough  Complete Medication List: 1)  Onetouch Ultra Test Strp (Glucose blood) .... As directed two times a day 2)  Metformin Hcl 1000 Mg Tabs (Metformin hcl) .... One tab two times a day 3)  Januvia 100 Mg Tabs (Sitagliptin phosphate) .... Once daily 4)  Fexofenadine-pseudoephedrine 60-120 Mg Xr12h-tab (Fexofenadine-pseudoephedrine) .... One tab two times a day 5)  Fenofibrate 160 Mg Tabs (Fenofibrate) .... Once daily 6)  Simvastatin 80 Mg Tabs (Simvastatin) .... Once daily 7)  Ramipril 10 Mg Caps (Ramipril) .... One by mouth once daily 8)  Aspirin Adult Low Strength 81 Mg Tbec (Aspirin) .... Two times a day 9)  Vitamin B-12 Cr 2000 Mcg Cr-tabs (Cyanocobalamin) .... Once daily 10)  Cvs Vitamin E 1000 Unit Caps (Vitamin e) .... Once daily 11)  Co-q 10 Omega-3 Fish Oil Caps (Coenzyme q10-fish oil-vit e) .... Once daily 12)  Cinnamon 500 Mg Caps (Cinnamon) .... Two times a day 13)  Garlic 123XX123 Mg Caps (Garlic) .... Two times a day 14)  Onetouch Ultra Mini  W/device Kit (Blood glucose monitoring suppl) .... Testing two times a day \\par  15)  Flavoxate Hcl 100 Mg Tabs (Flavoxate hcl) .... One by mouth three times a day prn 16)  Silvadene 1 % Crea (Silver sulfadiazine) ....  Apply to affected burn daily 17)  Lantus Solostar 100 Unit/ml Soln (Insulin glargine) .... Use as directed once daily 18)  Bd Pen Needle Mini U/f 31g X 5 Mm Misc (Insulin pen needle) .... Daily as directed 19)  Sertraline Hcl 50 Mg Tabs (Sertraline hcl) .... One by mouth once daily 20)  Azithromycin 250 Mg Tabs (Azithromycin) .... 2 by mouth today then one by mouth once daily for 4 days 21)  Hydrocodone-homatropine 5-1.5 Mg/56ml Syrp (Hydrocodone-homatropine) .... One tsp by mouth q 4-6 hours as needed cough  Patient Instructions: 1)  Please schedule a follow-up appointment in 3 months .  2)  Acute Bronchitis symptoms for less then 10 days are not  helped by antibiotics. Take over the counter cough medications. Call if no improvement in 5-7 days, sooner if increasing cough, fever, or new symptoms ( shortness of breath, chest pain) .  Prescriptions: HYDROCODONE-HOMATROPINE 5-1.5 MG/5ML SYRP (HYDROCODONE-HOMATROPINE) one tsp by mouth q 4-6 hours as needed cough  #120 ml x 0   Entered and Authorized by:   Carolann Littler MD   Signed by:   Carolann Littler MD on 12/09/2009   Method used:   Print then Give to Patient   RxID:   [id number] AZITHROMYCIN 250 MG TABS (AZITHROMYCIN) 2 by mouth today then one by mouth once daily for 4 days  #6 x 0   Entered and Authorized by:   Carolann Littler MD   Signed by:   Carolann Littler MD on 12/09/2009   Method used:   Print then Give to Patient   RxID:   [id number]    Orders Added: 1)  Est. Patient Level III [id number]

## 2010-02-09 NOTE — Progress Notes (Signed)
Summary: work note faxed  Phone Note Call from Patient Call back at [employer] [phone]   Caller: Fredia Sorrow Call For: Carolann Littler MD Summary of Call: pt needs work note from 09-24-2009 until ? pt sees dr Janice Norrie this week. please fax to [place] [phone] attn karla Initial call taken by: Glo Herring,  October 05, 2009 9:02 AM  Follow-up for Phone Call        I will take care of. Follow-up by: Carolann Littler MD,  October 05, 2009 9:29 AM  Additional Follow-up for Phone Call Additional follow up Details #1::        Work note faxed, confirmation received Additional Follow-up by: Nira Conn LPN,  September 26, 624THL 10:18 AM

## 2010-02-09 NOTE — Assessment & Plan Note (Signed)
Summary: nausea/vomiting/dm   Vital Signs:  Patient profile:   50 year old male Weight:      184 pounds O2 Sat:      96 % Temp:     98.5 degrees F oral Pulse rate:   119 / minute Pulse rhythm:   regular Resp:     12 per minute BP sitting:   110 / 60  Vitals Entered By: Deanna Artis CMA (September 28, 2009 1:36 PM)  History of Present Illness: Patient seen with persistent left flank and left lower quadrant abdominal pain. Question of UTI and placed on Cipro last wee. Developed nausea and vomiting over the weekend, thought to be secondary to medication.  Went to the walk-in clinic Saturday and antibiotic changed to Septra DS. Urine culture negative.  Patient developed new finding of fever up to 102 over the weekend intermittently. Has kept down some fluids today but very poor appetite. Had several months of weight loss probably related to poorly controlled diabetes. No known history of diverticular disease. No burning with urination. Does have a history previously of urethral stricture. No obstructive urinary symptoms. Denies bloody stools. Past hx of kidney stone.  Allergies: 1)  ! Morphine Sulfate Cr (Morphine Sulfate)  Past History:  Past Medical History: Last updated: 09/24/2009 Allergic rhinitis Diabetes mellitus, type II Hyperlipidemia Hypertension Urrethral stricture Hx kidney stones. PMH reviewed for relevance  Review of Systems       The patient complains of anorexia, fever, weight loss, and abdominal pain.  The patient denies chest pain, melena, hematochezia, severe indigestion/heartburn, hematuria, and incontinence.    Physical Exam  General:  Well-developed,well-nourished,in no acute distress; alert,appropriate and cooperative throughout examination Ears:  External ear exam shows no significant lesions or deformities.  Otoscopic examination reveals clear canals, tympanic membranes are intact bilaterally without bulging, retraction, inflammation or discharge.  Hearing is grossly normal bilaterally. Mouth:  Oral mucosa and oropharynx without lesions or exudates.  Teeth in good repair. Neck:  No deformities, masses, or tenderness noted. Lungs:  Normal respiratory effort, chest expands symmetrically. Lungs are clear to auscultation, no crackles or wheezes. Heart:  Normal rate and regular rhythm. S1 and S2 normal without gallop, murmur, click, rub or other extra sounds. Abdomen:  soft with tenderness left lower quadrant to deep palpation. No guarding or rebound. No masses palpated.no hepatomegaly and no splenomegaly.     Impression & Recommendations:  Problem # 1:  NAUSEA AND VOMITING (ICD-787.01) ?sec to med vs underling process contributing to abd pain and fever. Orders: Radiology Referral (Radiology) TLB-BMP (Basic Metabolic Panel-BMET) (99991111)  Problem # 2:  ABDOMINAL PAIN, LEFT LOWER QUADRANT (ICD-789.04) patient needs CT abdomen and pelvis to further evaluate this point given his nausea, vomiting, intermittent fever, and persistent symptoms of pain. Also has some weight loss which is probably largely related to poorly controlled diabetes.  Symptoms complex ? sec acute diverticulitis, pyelonephritis, vs other. Orders: Radiology Referral (Radiology)  Problem # 3:  DIABETES MELLITUS, TYPE II (ICD-250.00) Assessment: Deteriorated pt has been encouraged multiple times to get back on his medical regimen including insulin. His updated medication list for this problem includes:    Metformin Hcl 1000 Mg Tabs (Metformin hcl) ..... One tab two times a day    Januvia 100 Mg Tabs (Sitagliptin phosphate) ..... Once daily    Ramipril 10 Mg Caps (Ramipril) ..... One by mouth once daily    Aspirin Adult Low Strength 81 Mg Tbec (Aspirin) .[name]..[name] Two times a day  Actos 45 Mg Tabs (Pioglitazone hcl) ..... One po once daily    Lantus Solostar 100 Unit/ml Soln (Insulin glargine) ..... Use as directed once daily  Orders: TLB-BMP (Basic Metabolic  Panel-BMET) (99991111)  Complete Medication List: 1)  Onetouch Ultra Test Strp (Glucose blood) .... As directed two times a day 2)  Metformin Hcl 1000 Mg Tabs (Metformin hcl) .... One tab two times a day 3)  Januvia 100 Mg Tabs (Sitagliptin phosphate) .... Once daily 4)  Fexofenadine-pseudoephedrine 60-120 Mg Xr12h-tab (Fexofenadine-pseudoephedrine) .... One tab two times a day 5)  Fenofibrate 160 Mg Tabs (Fenofibrate) .... Once daily 6)  Simvastatin 80 Mg Tabs (Simvastatin) .... Once daily 7)  Ramipril 10 Mg Caps (Ramipril) .... One by mouth once daily 8)  Aspirin Adult Low Strength 81 Mg Tbec (Aspirin) .... Two times a day 9)  Vitamin B-12 Cr 2000 Mcg Cr-tabs (Cyanocobalamin) .... Once daily 10)  Cvs Vitamin E 1000 Unit Caps (Vitamin e) .... Once daily 11)  Co-q 10 Omega-3 Fish Oil Caps (Coenzyme q10-fish oil-vit e) .... Once daily 12)  Cinnamon 500 Mg Caps (Cinnamon) .... Two times a day 13)  Garlic 123XX123 Mg Caps (Garlic) .... Two times a day 14)  Onetouch Ultra Mini W/device Kit (Blood glucose monitoring suppl) .... Testing two times a day \\par  15)  Flavoxate Hcl 100 Mg Tabs (Flavoxate hcl) .... One by mouth three times a day prn 16)  Silvadene 1 % Crea (Silver sulfadiazine) .... Apply to affected burn daily 17)  Actos 45 Mg Tabs (Pioglitazone hcl) .... One po once daily 18)  Lantus Solostar 100 Unit/ml Soln (Insulin glargine) .... Use as directed once daily 19)  Bd Pen Needle Mini U/f 31g X 5 Mm Misc (Insulin pen needle) .... Daily as directed 20)  Sertraline Hcl 50 Mg Tabs (Sertraline hcl) .... One by mouth once daily 21)  Nystatin 100000 Unit/gm Crea (Nystatin) .... Apply to affected rash two times a day 22)  Levaquin 500 Mg Tabs (Levofloxacin) .... One by mouth once daily 23)  Promethazine-codeine 6.25-10 Mg/72ml Syrp (Promethazine-codeine) .... 5-10 ml by mouth q 6 hrs as needed pain/ nausea  Other Orders: UA Dipstick w/o Micro (automated)  [id number]) Urology Referral  (Urology)  Patient Instructions: 1)  Hold all antibiotics at this time. 2)  Drink plenty of fluids. 3)  Follow up promptly for any worsening abdominal pain or inability to keep down fluids. Prescriptions: LEVAQUIN 500 MG TABS (LEVOFLOXACIN) one by mouth once daily  #10 x 0   Entered and Authorized by:   Carolann Littler MD   Signed by:   Carolann Littler MD on 09/28/2009   Method used:   Electronically to        [facility] (retail)       [address]       [place], [place]  [address]       Ph: [phone]       Fax: [phone]   RxID:   [id number]   Laboratory Results   Urine Tests    Routine Urinalysis   Color: yellow Appearance: Clear Glucose: 3+   (Normal Range: Negative) Bilirubin: 1+   (Normal Range: Negative) Ketone: 1+   (Normal Range: Negative) Spec. Gravity: 1.010   (Normal Range: 1.003-1.035) Blood: 2+   (Normal Range: Negative) pH: 5.0   (Normal Range: 5.0-8.0) Protein: 2+   (Normal Range: Negative) Urobilinogen: 0.2   (Normal Range: 0-1) Nitrite: negative   (  Normal Range: Negative) Leukocyte Esterace: 1+   (Normal Range: Negative)    Comments: Joyce Gross  September 28, 2009 4:16 PM

## 2010-02-11 NOTE — Letter (Signed)
Summary: [facility]  [facility]   Imported By: Laural Benes 01/25/2010 [id number]  _____________________________________________________________________  External Attachment:    Type:   Image     Comment:   External Document

## 2010-02-11 NOTE — Assessment & Plan Note (Signed)
Summary: CPX // RS   Vital Signs:  Patient profile:   50 year old male Height:      70.5 inches Weight:      202 pounds Temp:     98.3 degrees F oral Pulse rate:   88 / minute Pulse rhythm:   regular Resp:     12 per minute BP sitting:   138 / 78  (left arm) Cuff size:   large  Vitals Entered By: Nira Conn LPN (January  9, [id number] 11:42 AM)  History of Present Illness: Here for CPE.  Still smoking.   Overall feels well.  Diabetes improved with recent fastings 120-130.  recent issues with kdney stone and hydronephrosis.  Clinical Review Panels:  Immunizations   Last Tetanus Booster:  Tdap (10/03/2008)   Allergies: 1)  ! Morphine Sulfate Cr (Morphine Sulfate)  Past History:  Past Medical History: Last updated: 09/24/2009 Allergic rhinitis Diabetes mellitus, type II Hyperlipidemia Hypertension Urrethral stricture Hx kidney stones.  Family History: Last updated: 04/14/2008 Family History of Alcoholism/Addiction heart disease stroke type 2 dm  Social History: Last updated: 04/14/2008 Occupation: Married Current Smoker Alcohol use-no Regular exercise-no  Risk Factors: Exercise: no (04/14/2008)  Risk Factors: Smoking Status: current (08/21/2009)  Past Surgical History: Repeat urethral surgery for recurrent obstruction 2011 R TKR 2007 PMH-FH-SH reviewed for relevance  Review of Systems  The patient denies anorexia, fever, weight loss, weight gain, vision loss, decreased hearing, hoarseness, chest pain, syncope, dyspnea on exertion, peripheral edema, prolonged cough, headaches, hemoptysis, abdominal pain, melena, hematochezia, severe indigestion/heartburn, hematuria, incontinence, genital sores, muscle weakness, suspicious skin lesions, transient blindness, difficulty walking, depression, unusual weight change, abnormal bleeding, enlarged lymph nodes, breast masses, and testicular masses.    Physical Exam  General:  Well-developed,well-nourished,in  no acute distress; alert,appropriate and cooperative throughout examination Head:  Normocephalic and atraumatic without obvious abnormalities. No apparent alopecia or balding. Eyes:  No corneal or conjunctival inflammation noted. EOMI. Perrla. Funduscopic exam benign, without hemorrhages, exudates or papilledema. Vision grossly normal. Ears:  External ear exam shows no significant lesions or deformities.  Otoscopic examination reveals clear canals, tympanic membranes are intact bilaterally without bulging, retraction, inflammation or discharge. Hearing is grossly normal bilaterally. Mouth:  Oral mucosa and oropharynx without lesions or exudates.  Teeth in good repair. Neck:  No deformities, masses, or tenderness noted. Lungs:  Normal respiratory effort, chest expands symmetrically. Lungs are clear to auscultation, no crackles or wheezes. Heart:  Normal rate and regular rhythm. S1 and S2 normal without gallop, murmur, click, rub or other extra sounds. Abdomen:  Bowel sounds positive,abdomen soft and non-tender without masses, organomegaly or hernias noted. Rectal:  No external abnormalities noted. Normal sphincter tone. No rectal masses or tenderness. Prostate:  Prostate gland firm and smooth, no enlargement, nodularity, tenderness, mass, asymmetry or induration. Extremities:  scar R knee from prior TKR  no effusion. Neurologic:  alert & oriented X3, cranial nerves II-XII intact, and strength normal in all extremities.   Skin:  no rashes and no suspicious lesions.   Cervical Nodes:  No lymphadenopathy noted Psych:  Oriented X3, memory intact for recent and remote, normally interactive, good eye contact, not anxious appearing, and not depressed appearing.     Impression & Recommendations:  Problem # 1:  Preventive Health Care (ICD-V70.0) discussed smoking cessation but motivation low at this time. Hgb minimally low.  Hemoccults given.  Creatinine up somewhat from one year ago but down from few  months ago.  repeat in 3 mnths.  Complete Medication List: 1)  Onetouch Ultra Test Strp (Glucose blood) .... As directed two times a day 2)  Metformin Hcl 1000 Mg Tabs (Metformin hcl) .... One tab two times a day 3)  Januvia 100 Mg Tabs (Sitagliptin phosphate) .... Once daily 4)  Fexofenadine-pseudoephedrine 60-120 Mg Xr12h-tab (Fexofenadine-pseudoephedrine) .... One tab two times a day 5)  Fenofibrate 160 Mg Tabs (Fenofibrate) .... Once daily 6)  Simvastatin 80 Mg Tabs (Simvastatin) .... Once daily 7)  Ramipril 10 Mg Caps (Ramipril) .... One by mouth once daily 8)  Aspirin Adult Low Strength 81 Mg Tbec (Aspirin) .... Two times a day 9)  Vitamin B-12 Cr 2000 Mcg Cr-tabs (Cyanocobalamin) .... Once daily 10)  Cvs Vitamin E 1000 Unit Caps (Vitamin e) .... Once daily 11)  Co-q 10 Omega-3 Fish Oil Caps (Coenzyme q10-fish oil-vit e) .... Once daily 12)  Cinnamon 500 Mg Caps (Cinnamon) .... Two times a day 13)  Garlic 123XX123 Mg Caps (Garlic) .... Two times a day 14)  Onetouch Ultra Mini W/device Kit (Blood glucose monitoring suppl) .... Testing two times a day \\par  15)  Flavoxate Hcl 100 Mg Tabs (Flavoxate hcl) .... One by mouth three times a day prn 16)  Lantus Solostar 100 Unit/ml Soln (Insulin glargine) .... Use as directed once daily 17)  Bd Pen Needle Mini U/f 31g X 5 Mm Misc (Insulin pen needle) .... Daily as directed  Patient Instructions: 1)  Please schedule a follow-up appointment in 3 months .  2)  BMP prior to visit, ICD-9: 401.9 3)  CBC w/ Diff prior to visit ICD-9 : 285.9 4)  HgBA1c prior to visit  ICD-9: 250.00 5)  Stop smoking tips: Choose a quit date. Cut down before the quit date. Decide what you will do as a substitute when you feel the urge to smoke(gum, toothpick, exercise).  6)  It is important that you exercise reguarly at least 20 minutes 5 times a week. If you develop chest pain, have severe difficulty breathing, or feel very tired, stop exercising immediately and seek  medical attention.    Orders Added: 1)  Est. Patient 40-64 years [id number]

## 2010-02-12 NOTE — Letter (Signed)
Summary: [facility]  [facility]   Imported By: Laural Benes 12/16/2009 09:00:26  _____________________________________________________________________  External Attachment:    Type:   Image     Comment:   External Document

## 2010-03-23 LAB — GLUCOSE, CAPILLARY: Glucose-Capillary: 113 mg/dL — ABNORMAL HIGH (ref 70–99)

## 2010-03-25 LAB — GLUCOSE, CAPILLARY
Glucose-Capillary: 332 mg/dL — ABNORMAL HIGH (ref 70–99)
Glucose-Capillary: 413 mg/dL — ABNORMAL HIGH (ref 70–99)

## 2010-03-25 LAB — URINE CULTURE
Colony Count: NO GROWTH
Culture  Setup Time: 201109210226
Culture: NO GROWTH
Special Requests: NEGATIVE

## 2010-03-25 LAB — POCT I-STAT 4, (NA,K, GLUC, HGB,HCT)
Glucose, Bld: 399 mg/dL — ABNORMAL HIGH (ref 70–99)
HCT: 39 % (ref 39.0–52.0)
Hemoglobin: 13.3 g/dL (ref 13.0–17.0)
Potassium: 4.7 mEq/L (ref 3.5–5.1)
Sodium: 131 mEq/L — ABNORMAL LOW (ref 135–145)

## 2010-04-13 ENCOUNTER — Other Ambulatory Visit (INDEPENDENT_AMBULATORY_CARE_PROVIDER_SITE_OTHER): Payer: BC Managed Care – PPO | Admitting: Family Medicine

## 2010-04-13 DIAGNOSIS — D649 Anemia, unspecified: Secondary | ICD-10-CM

## 2010-04-13 DIAGNOSIS — E119 Type 2 diabetes mellitus without complications: Secondary | ICD-10-CM

## 2010-04-13 DIAGNOSIS — I10 Essential (primary) hypertension: Secondary | ICD-10-CM

## 2010-04-13 LAB — CBC WITH DIFFERENTIAL/PLATELET
Basophils Absolute: 0 10*3/uL (ref 0.0–0.1)
Basophils Relative: 0.5 % (ref 0.0–3.0)
Eosinophils Absolute: 0.3 10*3/uL (ref 0.0–0.7)
Eosinophils Relative: 3.5 % (ref 0.0–5.0)
HCT: 38 % — ABNORMAL LOW (ref 39.0–52.0)
Hemoglobin: 13 g/dL (ref 13.0–17.0)
Lymphocytes Relative: 16.9 % (ref 12.0–46.0)
Lymphs Abs: 1.6 10*3/uL (ref 0.7–4.0)
MCHC: 34.2 g/dL (ref 30.0–36.0)
MCV: 85.1 fl (ref 78.0–100.0)
Monocytes Absolute: 0.5 10*3/uL (ref 0.1–1.0)
Monocytes Relative: 5.8 % (ref 3.0–12.0)
Neutro Abs: 6.7 10*3/uL (ref 1.4–7.7)
Neutrophils Relative %: 73.3 % (ref 43.0–77.0)
Platelets: 211 10*3/uL (ref 150.0–400.0)
RBC: 4.47 Mil/uL (ref 4.22–5.81)
RDW: 13.1 % (ref 11.5–14.6)
WBC: 9.2 10*3/uL (ref 4.5–10.5)

## 2010-04-13 LAB — HEMOGLOBIN A1C: Hgb A1c MFr Bld: 9.5 % — ABNORMAL HIGH (ref 4.6–6.5)

## 2010-04-14 LAB — BASIC METABOLIC PANEL
BUN: 18 mg/dL (ref 6–23)
CO2: 28 mEq/L (ref 19–32)
Calcium: 9.7 mg/dL (ref 8.4–10.5)
Chloride: 106 mEq/L (ref 96–112)
Creatinine, Ser: 1.6 mg/dL — ABNORMAL HIGH (ref 0.4–1.5)
GFR: 47.78 mL/min — ABNORMAL LOW (ref >60.00)
Glucose, Bld: 177 mg/dL — ABNORMAL HIGH (ref 70–99)
Potassium: 5.2 mEq/L — ABNORMAL HIGH (ref 3.5–5.1)
Sodium: 140 mEq/L (ref 135–145)

## 2010-04-19 ENCOUNTER — Encounter: Payer: Self-pay | Admitting: Family Medicine

## 2010-04-20 ENCOUNTER — Ambulatory Visit (INDEPENDENT_AMBULATORY_CARE_PROVIDER_SITE_OTHER): Payer: BC Managed Care – PPO | Admitting: Family Medicine

## 2010-04-20 ENCOUNTER — Encounter: Payer: Self-pay | Admitting: Family Medicine

## 2010-04-20 VITALS — BP 138/80 | Temp 98.6°F | Ht 70.5 in | Wt 202.0 lb

## 2010-04-20 DIAGNOSIS — E119 Type 2 diabetes mellitus without complications: Secondary | ICD-10-CM

## 2010-04-20 DIAGNOSIS — R252 Cramp and spasm: Secondary | ICD-10-CM

## 2010-04-20 DIAGNOSIS — N181 Chronic kidney disease, stage 1: Secondary | ICD-10-CM

## 2010-04-20 DIAGNOSIS — D649 Anemia, unspecified: Secondary | ICD-10-CM

## 2010-04-20 NOTE — Progress Notes (Signed)
  Subjective:    Patient ID: Anthony Bullock, male    DOB: October 25, 1960, 50 y.o.   MRN: [id number]  HPI Patient seen for followup. He has history of type 2 diabetes, hyperlipidemia, hypertension, chronic kidney disease, and urethral stricture. Being referred to [facility] for further evaluation possible surgery. Still smoking but trying to quit.  History of kidney stones. Recent addition of hydrochlorothiazide 25 mg daily to his regimen. Increase leg cramps at night. He feels he is taking adequate fluids. Recent potassium 5.2. A1c elevated at 9.5%. He has not been taking his Lantus insulin consistently. Was doing much better when taking Lantus in combination with other medications. Does not take a multivitamin.  Was told by notification from [facility] recently that he had elevated thyroid functions and TSH was normal here back with his physical several months ago. We do not have copy of those abnormal labs. He does not have any diarrhea or weight loss issues.  Creatinine up slightly at 1.6 by recent lab.  Review of Systems  Constitutional: Negative for activity change, appetite change and unexpected weight change.  Eyes: Negative for visual disturbance.  Respiratory: Negative for cough and shortness of breath.   Cardiovascular: Negative for chest pain, palpitations and leg swelling.  Gastrointestinal: Negative for abdominal pain.  Genitourinary: Positive for difficulty urinating. Negative for hematuria.  Musculoskeletal: Negative for back pain.  Skin: Negative for rash.  Psychiatric/Behavioral: Negative for dysphoric mood.       Objective:   Physical Exam  Constitutional: He is oriented to person, place, and time. He appears well-developed and well-nourished. No distress.  HENT:  Head: Normocephalic and atraumatic.  Right Ear: External ear normal.  Left Ear: External ear normal.  Mouth/Throat: Oropharynx is clear and moist. No oropharyngeal exudate.  Eyes: EOM are  normal. Pupils are equal, round, and reactive to light.  Neck: Normal range of motion. Neck supple. No thyromegaly present.  Cardiovascular: Normal rate, regular rhythm and normal heart sounds.   No murmur heard. Pulmonary/Chest: Effort normal and breath sounds normal. He has no wheezes. He has no rales.  Musculoskeletal: He exhibits no edema.  Lymphadenopathy:    He has no cervical adenopathy.  Neurological: He is alert and oriented to person, place, and time.       Normal sensory function of both feet with monofilament testing  Skin: No rash noted.  Psychiatric: He has a normal mood and affect.          Assessment & Plan:  #1 type 2 diabetes very poorly controlled. Get back on Lantus 10 units daily and titration regimen given and reassess in one month #2 chronic kidney disease. May need to reconsider stopping metformin. Repeat basic metabolic panel in one month #3 urethral stricture followed by urology #4 hyperlipidemia #5 history of kidney stones #6 leg cramps-?related to poorly controlled diabetes.  Recent K OK.  Try multivitamin with B6.  Check Magnesium level if persists.

## 2010-04-20 NOTE — Patient Instructions (Signed)
Get back on Lantus 10 units daily and titrate up every 3 days by 2 units until fasting glucose consistently around 130. Drink plenty of fluids.

## 2010-04-21 NOTE — Progress Notes (Signed)
  Subjective:    Patient ID: Anthony Bullock, male    DOB: 10/08/1960, 51 y.o.   MRN: [id number]  HPI    Review of Systems     Objective:   Physical Exam        Assessment & Plan:  Labs received from [facility]. TSH normal.  Total thyroxine 14.8   He has no overt symptoms of hyperthyroid and with normal TSH with simply repeat at follow up in one month.

## 2010-04-21 NOTE — Progress Notes (Signed)
  Subjective:    Patient ID: Anthony Bullock, male    DOB: Aug 07, 1960, 50 y.o.   MRN: [id number]  HPI    Review of Systems     Objective:   Physical Exam        Assessment & Plan:

## 2010-04-22 LAB — BASIC METABOLIC PANEL
BUN: 16 mg/dL (ref 6–23)
CO2: 27 mEq/L (ref 19–32)
Calcium: 9.8 mg/dL (ref 8.4–10.5)
Chloride: 104 mEq/L (ref 96–112)
Creatinine, Ser: 0.98 mg/dL (ref 0.4–1.5)
GFR calc Af Amer: >60 mL/min (ref >60)
GFR calc non Af Amer: >60 mL/min (ref >60)
Glucose, Bld: 110 mg/dL — ABNORMAL HIGH (ref 70–99)
Potassium: 4.1 mEq/L (ref 3.5–5.1)
Sodium: 136 mEq/L (ref 135–145)

## 2010-04-22 LAB — GLUCOSE, CAPILLARY
Glucose-Capillary: 126 mg/dL — ABNORMAL HIGH (ref 70–99)
Glucose-Capillary: 143 mg/dL — ABNORMAL HIGH (ref 70–99)

## 2010-04-22 LAB — POCT HEMOGLOBIN-HEMACUE: Hemoglobin: 15.1 g/dL (ref 13.0–17.0)

## 2010-05-04 ENCOUNTER — Encounter: Payer: Self-pay | Admitting: Family Medicine

## 2010-05-12 ENCOUNTER — Ambulatory Visit (INDEPENDENT_AMBULATORY_CARE_PROVIDER_SITE_OTHER): Payer: BC Managed Care – PPO | Admitting: Family Medicine

## 2010-05-12 ENCOUNTER — Encounter: Payer: Self-pay | Admitting: Family Medicine

## 2010-05-12 DIAGNOSIS — N181 Chronic kidney disease, stage 1: Secondary | ICD-10-CM

## 2010-05-12 DIAGNOSIS — E119 Type 2 diabetes mellitus without complications: Secondary | ICD-10-CM

## 2010-05-12 DIAGNOSIS — R946 Abnormal results of thyroid function studies: Secondary | ICD-10-CM

## 2010-05-12 LAB — BASIC METABOLIC PANEL
BUN: 16 mg/dL (ref 6–23)
CO2: 26 mEq/L (ref 19–32)
Calcium: 9.7 mg/dL (ref 8.4–10.5)
Chloride: 103 mEq/L (ref 96–112)
Creatinine, Ser: 1.5 mg/dL (ref 0.4–1.5)
GFR: 53.36 mL/min — ABNORMAL LOW (ref >60.00)
Glucose, Bld: 234 mg/dL — ABNORMAL HIGH (ref 70–99)
Potassium: 4.9 mEq/L (ref 3.5–5.1)
Sodium: 136 mEq/L (ref 135–145)

## 2010-05-12 LAB — TSH: TSH: 1.29 u[IU]/mL (ref 0.35–5.50)

## 2010-05-12 NOTE — Progress Notes (Signed)
  Subjective:    Patient ID: Anthony Bullock, male    DOB: 14-Aug-1960, 50 y.o.   MRN: [id number]  HPI Patient's here for followup. Quit smoking a few days ago. Patient prepparing for probable urethral reconstruction surgery. History of urethral stricture. He has type 2 diabetes which is poorly controlled. Last A1c 9.5%. Is back on Lantus insulin and blood sugars have improved somewhat. Recent creatinine 1.6.   Needs repeat today. Also recent total thyroxine 14.8 at outside [facility] with normal TSH. No symptoms of overt hyperthyroidism.   Review of Systems  Constitutional: Negative for fever and chills.  Eyes: Negative for visual disturbance.  Respiratory: Negative for cough and shortness of breath.   Cardiovascular: Negative for chest pain and leg swelling.  Genitourinary: Positive for dysuria.       Objective:   Physical Exam  Constitutional: He appears well-developed and well-nourished. No distress.  Cardiovascular: Normal rate, regular rhythm and normal heart sounds.   No murmur heard. Pulmonary/Chest: Effort normal and breath sounds normal. No respiratory distress. He has no wheezes. He has no rales.  Musculoskeletal: He exhibits no edema.  Psychiatric: He has a normal mood and affect.          Assessment & Plan:  #1 chronic kidney disease. Recheck basic metabolic panel today #2 abnormal thyroid function tests with elevated total thyroxine but normal TSH. Repeat TSH today and if normal observe #3 type 2 diabetes- poorly controlled. Continue Lantus and we'll plan followup in 3 months to reassess A1c

## 2010-05-13 NOTE — Progress Notes (Signed)
Quick Note:  Pt informed on home VM ______ 

## 2010-05-18 ENCOUNTER — Ambulatory Visit: Payer: BC Managed Care – PPO | Admitting: Family Medicine

## 2010-05-25 NOTE — Op Note (Signed)
NAMEBALDO, Anthony Bullock                 ACCOUNT NO.:  [id number]   MEDICAL RECORD NO.:  [id number]          PATIENT TYPE:  AMB   LOCATION:  [facility]                         FACILITY:  [facility]   PHYSICIAN:  Reece Packer, MD DATE OF BIRTH:  07-May-1960   DATE OF PROCEDURE:  03/21/2008  DATE OF DISCHARGE:                               OPERATIVE REPORT   PREOPERATIVE DIAGNOSIS:  Urethral stricture.   POSTOPERATIVE DIAGNOSIS:  Urethral stricture.   SURGERY:  Cystoscopy, retrograde urethrogram and balloon dilation.   Anthony Bullock has had two dilations.  He had four procedures when he was  young.  He has recurrent obstructive symptoms.   DESCRIPTION OF PROCEDURE:  The patient is prepped and draped in usual  fashion.  He is given preoperative antibiotics.  I initially used a 63-  [place] scope with 30 degrees lens.  The distal penile urethra was pale  but of normal caliber until approximately of 12-14 French stricture was  located just distal to the penoscrotal junction.  I could see another  stricture upstream.   Using a red rubber catheter and contrast, I then did a retrograde  urethrogram.   Retrograde urethrogram:  With the patient lithotomy position, I situated  the C-arm in good location.  I put the penis on stretch to the patient's  left side.  I used a red rubber catheter and a catheter tipped syringe  and approximately 30 mL of contrast.  I injected in a retrograde fashion  and contrast went up into the bladder.  He had an obvious long stricture  of 5 or 6 cm in length in the bulbar and proximal penile urethra.  He  had normal caliber for approximately 3 cm distal to the sphincter though  there was one area 1 cm distal to the sphincter that was a bit narrow.  This film was repeated.  There were a few the air bubbles that were not  consequential.    I passed a sensor wire under fluoroscopic guidance up into the bladder.  I passed the balloon dilation catheter in appropriate position  with a  marker in the prostatic urethra.  I balloon dilated to 18 atmospheres  for 5 minutes.  I then removed the wire and cystoscoped the patient  along the wire with a 17-French scope.  I easily entered the bladder and  remove the wire.   Bladder mucosa and trigone were normal.  There was no stitch, foreign  body or carcinoma.  Prostatic urethra was normal.  Verumontanum and  sphincter was normal.  I carefully staged and inspected the entire  urethra.  There is no question that he had a significant scar  approximately 1 cm distal to the sphincter that was dilated and now  associated with spongiofibrosis.  The length of this stricture was  approximately 2 to 3 cm.  Beyond this he had several ringlets that were  dilated with unhealthy sponge.  He then had an obvious dilated stricture  initially located the penoscrotal junction.   There was no perforation.  He was easy to scope through with a  17  degrees lens.   In summary, Anthony Bullock has pan urethral stricture disease just distal to  the sphincter at least to the penoscrotal junction.  Distal to this, the  urethra was not healthy looking but it was of normal caliber.  Based  upon the measured and visualized length he would need approximately a 9  cm reconstruction.  The urethra itself though pale and strictured was  not a rigid tube.   In my opinion Anthony Bullock would be best managed with infrequent in and  out self-catheterization and this will be discussed with the patient.           ______________________________  Reece Packer, MD  Electronically Signed     SAM/MEDQ  D:  03/21/2008  T:  03/21/2008  Job:  [id number]

## 2010-05-28 NOTE — H&P (Signed)
NAME:  Anthony Bullock, Anthony Bullock NO.:  [id number]   MEDICAL RECORD NO.:  [id number]          PATIENT TYPE:  INP   LOCATION:  NA                           [facility]ACILITY[facility]  [facility]   PHYSICIAN:  Cynda Familia, M.D.DATE OF BIRTH:  1960/08/23   DATE OF ADMISSION:  09/02/2005  DATE OF DISCHARGE:                                HISTORY & PHYSICAL   CHIEF COMPLAINT:  End-stage osteoarthritis right knee.   HISTORY OF PRESENT ILLNESS:  This is a 50 year old gentleman with a history  of end-stage osteoarthritis of his right knee with failure of conservative  therapy to manage his pain. Due to difficulties with activities of daily  living and pain with every step, the patient is now scheduled for total knee  arthroplasty of his right knee. The surgery risks, benefits, and aftercare  were discussed in detail with the patient, questions invited and answered.  He has received medical clearance from his medical doctor, Dr. Carolann Littler and surgery will go ahead as scheduled. We will have Dr. Elease Hashimoto  or the hospitalist follow him postoperatively for management of his diabetes  and hypertension.   PAST MEDICAL HISTORY:   DRUG ALLERGIES:  None.   CURRENT MEDICATIONS:  1. Altace 5 mg 1 daily.  2. Allegra-D 1 p.o. b.i.d.  3. Januvia 100 mg p.o. daily.  4. Lipitor 40 mg daily.  5. Metformin 1000 mg b.i.d.  6. Tricor 145 mg daily.  7. Chantix 1 mg p.o. daily.  8. Baby aspirin per day.  9. Vitamin C.  10.Fish oil.   PAST SURGICAL HISTORY:  Herniorrhaphy, bilateral knee arthroscopies and  urinary surgery for urinary strictures.   SERIOUS MEDICAL ILLNESSES:  Diabetes, hypertension and hypercholesterolemia.   FAMILY HISTORY:  Positive for coronary artery disease, diabetes and cancer.   SOCIAL HISTORY:  The patient is married, he lives at home. He smokes 2 packs  a day but has recently quit and stopped drinking several months ago as well.   REVIEW OF SYSTEMS:  CENTRAL NERVOUS  SYSTEM:  Negative for headache, blurred  vision or dizziness. PULMONARY:  Positive for a history of bronchitis,  negative for shortness of breath, PND and orthopnea. CARDIOVASCULAR:  Negative for chest pain or palpitations. GI:  Negative for ulcers,  hepatitis. GU:  Positive for history of kidney stones and urethral  stricture. MUSCULOSKELETAL:  Positive as in HPI.   PHYSICAL EXAMINATION:  VITAL SIGNS:  BP 110/78, respirations 16, pulse 88  and regular.  GENERAL:  This is a well-developed, well-nourished gentleman in no acute  distress.  HEENT:  Head normocephalic, nose patent, ears patent, pupils equal round and  reactive to light, throat without injection.  NECK:  Supple without adenopathy. Carotids 2+ without bruit.  CHEST:  Clear to auscultation, no rales or rhonchi. Respirations 16.  HEART:  Regular rate and rhythm at 88 beats per minute without murmur.  ABDOMEN:  Soft with active bowel sounds. No mass or organomegaly.  NEUROLOGIC:  Patient alert and oriented to time, place and person. Cranial  nerves II-XII grossly intact.  EXTREMITIES:  Shows the  right knee with -3 to 140 degree range of motion,  crepitation throughout motion and pain on full flexion. Neurovascular status  intact. Dorsalis pedis and posterior tibialis pulses are 2+.   X-rays show end-stage osteoarthritis, right knee.   IMPRESSION:  End-stage osteoarthritis, right knee.   PLAN:  Total knee arthroplasty, right knee.      Judith Part. Chabon, P.A.    ______________________________  Cynda Familia, M.D.    SJC/MEDQ  D:  08/26/2005  T:  08/26/2005  Job:  [id number]

## 2010-05-28 NOTE — Discharge Summary (Signed)
Anthony Bullock, Anthony Bullock NO.:  [id number]   MEDICAL RECORD NO.:  [id number]          PATIENT TYPE:  INP   LOCATION:  1503                         FACILITY:  [place]:  Cynda Familia, M.D.DATE OF BIRTH:  May 12, 1960   DATE OF ADMISSION:  09/23/2005  DATE OF DISCHARGE:  09/27/2005                                 DISCHARGE SUMMARY   ADMISSION DIAGNOSES:  1. Status post total knee arthroplasty, right knee.  2. Diabetes.  3. Hypertension.   DISCHARGE DIAGNOSES:  1. Status post total knee arthroplasty, right knee.  2. Diabetes.  3. Hypertension.  4. Tachycardia.  5. Urinary stricture.   OPERATION:  Total knee arthroplasty, right knee.   BRIEF HISTORY:  This is a 50 year old gentleman with a history of end-stage  osteoarthritis of his right knee with failure of conservative therapy to  manage his pain. Due to activities of daily living difficulties, he is  scheduled for total knee arthroplasty. The surgery risks, benefits and after  care were discussed with the patient, questions invited and answered and  surgery to go ahead as scheduled.   LABORATORY DATA:  Admission CBC within normal limits. Hemoglobin and  hematocrit reached a low of 12.2 and 35.8. At discharge, he had a mildly  elevated white count at 10.7 on the 17th, 13.4 on the 15th. The patient's PT  and PTT within normal limits. INR 2.0 at discharge. Admission BMET showed  sodium to be slightly low at 134, glucose mildly elevated at 121. His sugars  were again mildly elevated at 170, 166 and 188 through admission. He had one  episode of mild hypokalemia at 3.4 which was corrected. Liver functions were  normal, glycosylate and hemoglobin elevated at 9.5. Cardiac enzymes and  troponin normal. Lipid profile showed cholesterol at 126. Triglycerides 92,  HDL at 39. LDL cholesterol 69, __________  at 18. Urine showed trace ketones  otherwise normal.   HOSPITAL COURSE:  The patient tolerated  the operative procedure well. An  [facility] hospitalist consult was obtained to manage diabetes and  hypertension. Also during surgery, there was trouble placing a Foley and Dr.  Janice Norrie was consulted for Foley placement and follow the patient postop. His  Foley was discontinued on the third postoperative day, he urinated without  difficulty. The first postoperative day vital signs were stable, he was  afebrile, he was mildly tachycardic. Hemovac was discontinued without  difficulty. Calves were negative, lungs were clear, heart sounds were  normal. The patient was noted to be mildly tachycardic and was started on  Toprol XL by [facility] hospitalist. Workup was undertaken for silent MI and  all cardiac enzymes and EKG showed no evidence of myocardial infarction. The  second postoperative day vital signs were stable, he was feeling good,  minimal pain. Dressing was changed, wound was benign, calves were negative,  lungs were clear. Chest x-ray was obtained and showed mild atelectasis at  both bases which was unchanged. He was switched from PCA to p.o. pain  medicine, IV was Hep-locked. The third postoperative day he was feeling  good, he  had moderate pain the evening before. His vital signs were stable,  mild tachycardia at 101, he was afebrile. O2 98 on room air, hemoglobin  12.2, hematocrit 35.8, glucose 188, INR 2.5, cardiac enzymes were normal.  Chest x-ray showed the atelectasis at the bases. Lung sounds were clear with  mildly decreased sounds at the bases. Heart sounds were normal, bowel sounds  sluggish, calves negative and the wound was benign and discharge planning  was made if it was okay with the medical service. On September 17, the  patient was seen by the medical service and they felt he was ready for  discharge, but they recommended continuing his Toprol XL 25 mg p.o. on an  outpatient basis. On September 18 with vital signs stable, afebrile, mildly  tachycardic at 98 with INR  of 2.0, bowel sounds active, calves negative,  lung sounds clear, heart sounds normal and his wound benign and urinating  without difficulty, the patient was subsequently discharged home to followup  in the office.   CONDITION ON DISCHARGE:  Improved.   DISCHARGE MEDICATIONS:  1. Percocet 5/325 1-2 q.6h p.r.n. pain.  2. Robaxin 500 1 p.o. q.8h p.r.n. spasm.  3. Trinsicon 1 p.o. b.i.d. for anemia.  4. Coumadin per pharmacy protocol.  5. Toprol XL 25 mg 1 p.o. daily per [facility] hospitalist.  6. He will resume his usual diabetes medicines at home but remain off his      aspirin and fish oil and supplements until after he is off the      Coumadin.   FOLLOWUP:  He will followup with Dr. Theda Sers in 2 weeks, Dr. Elease Hashimoto in 1  week for evaluation of his tachycardia and to see whether he needs to  continue his Toprol XL and Dr. Janice Norrie in a week for followup of his urinary  tract symptoms. He will remain weightbearing as tolerated with the use of  his walker and do his home therapy with [place].      Judith Part. Chabon, P.A.    ______________________________  Cynda Familia, M.D.    SJC/MEDQ  D:  09/27/2005  T:  09/27/2005  Job:  [id number]   cc:   Carolann Littler, M.D.  [phone]   Hanley Ben, M.D.  Fax: [phone]

## 2010-05-28 NOTE — Op Note (Signed)
NAMEJAMES, GUARDADO                 ACCOUNT NO.:  [id number]   MEDICAL RECORD NO.:  [id number]          PATIENT TYPE:  INP   LOCATION:  [place]                         FACILITY:  [facility]   PHYSICIAN:  Ronald L. Rosana Hoes, M.D.  DATE OF BIRTH:  1960-04-14   DATE OF PROCEDURE:  09/23/2005  DATE OF DISCHARGE:                                 OPERATIVE REPORT   PREOPERATIVE DIAGNOSIS:  Urethral stricture.   POSTOPERATIVE DIAGNOSIS:  Urethral stricture.   PROCEDURE PERFORMED:  Urethroscopy and urethral dilation and complex  insertion of Foley catheter.   SURGEON:  Duane Lope. Rosana Hoes, M.D., urology specialist   ASSISTANT:  Lucie Leather, M.D.   ANESTHESIA:  General.   ESTIMATED BLOOD LOSS:  Minimal.   COMPLICATIONS:  None.   INDICATIONS FOR PROCEDURE:  This is a 50 year old gentleman who was admitted  to the orthopedic services for total knee replacement.  An attempt at  placing Foley catheter by the operative staff failed.  The patient does have  a history of urethral strictures.  At this point, cystoscopy and urethral  dilation is indicated.   DESCRIPTION OF PROCEDURE:  An interoperative consultation was called to the  urology services after the operative staff failed to place a Foley catheter.  The patient was already in the operating room in the supine position.  He  was given 2 grams Ancef preoperatively.  An attempt at placing a 41 [place]  and 16 [place] coude catheter by [name] failed and it felt like the catheter was  hitting a brick wall.  The flexible cystoscope was then used to access the  urethra and a narrow 12 French stricture was noted at the level of the  bulbous urethra.  A suicidal wire was advanced through that stricture into  the bladder with no difficulty through the flexible cystoscope.  The  stricture was then dilated with dilators up to 70 [place] with no  complications.  Following dilation, the suicidal wire was then removed and a  16 [place] coude catheter could easily be  placed into the bladder.  Clear  urine effluxed and the balloon was inflated with 10 mL of sterile water.  Please note that Dr. Rosana Hoes was present and participated in the entire  procedure as he was the responsible surgeon.  Complications were none.   DISPOSITION:  The Foley catheter needs to be in place for 2-3 days.  Following that, the primary team can electively remove the Foley catheter if  clinically indicated.  Please follow up with Dr. Janice Norrie in about 2-3 weeks.     ______________________________  Lucie Leather, MD      Duane Lope. Rosana Hoes, M.D.  Electronically Signed    SK/MEDQ  D:  09/23/2005  T:  09/24/2005  Job:  [id number]

## 2010-05-28 NOTE — H&P (Signed)
NAME:  Anthony Bullock, Anthony Bullock NO.:  [id number]   MEDICAL RECORD NO.:  [id number]          PATIENT TYPE:  INP   LOCATION:  NA                           FACILITY:  [facility]   PHYSICIAN:  Cynda Familia, M.D.DATE OF BIRTH:  [birth year]   DATE OF ADMISSION:  DATE OF DISCHARGE:                                HISTORY & PHYSICAL   CHIEF COMPLAINT:  End-stage osteoarthritis, right knee.   BRIEF HISTORY:  This is a 50 year old gentleman with a history of end-stage  osteoarthritis of his right knee with failure of conservative therapy to  manage his pain.  Due to difficulties with activities of daily living and  pain with every step, he is now scheduled for total knee arthroplasty of the  right knee.  The surgery, risks, benefits and aftercare were discussed in  detail with the patient, questions invited and answered.  He was previously  scheduled for surgery but it had to be cancelled due to uncontrolled  diabetes.  He has since seen his medical doctor, Dr. Carolann Littler, his  medications have been adjusted, and Dr. Elease Hashimoto feels that he is now under  adequate control to proceed with surgery.  Surgery will go ahead as  scheduled.  The surgery, risks, benefits and aftercare have been discussed  with the patient, questions were invited and answered, and surgery to go  ahead as scheduled.   PAST MEDICAL HISTORY:   DRUG ALLERGIES:  None.   CURRENT MEDICATIONS:  1. Actos one p.o. daily.  2. Altace 5 mg one p.o. daily.  3. Allegra-D one p.o. b.i.d.  4. Januvia 100 mg one p.o. daily.  5. Lipitor 40 mg one daily.  6. Metformin 1000 mg b.i.d.  7. Tricor 145 mg daily.  8. Chantix 1 mg p.o. daily.  9. Baby aspirin one per day, which he has stopped.  10.Fish oil.  11.Vitamin C.   PREVIOUS SURGERIES:  1. Herniorrhaphy.  2. Bilateral knee arthroscopies.  3. Urinary surgery for urinary strictures.   SERIOUS MEDICAL ILLNESSES:  1. Diabetes.  2. Hypertension.  3.  Hypercholesterolemia.   FAMILY HISTORY:  Positive for coronary disease, diabetes and cancer.   SOCIAL HISTORY:  The patient is married.  He lives at home.  He smokes 2  packs a day but has recently quit and stopped drinking several months ago as  well.   REVIEW OF SYSTEMS:  CENTRAL NERVOUS SYSTEM:  Negative for headache, blurred  or dizziness.  PULMONARY:  Positive for a history of bronchitis.  Negative  for shortness of breath, PND and orthopnea.  CARDIOVASCULAR:  No chest pain  or palpitation.  GI: Negative for ulcers, hepatitis.  GU: Positive for  history of kidney stones and urethral stricture.  MUSCULOSKELETAL:  Positive  as in HPI.   PHYSICAL EXAMINATION:  VITAL SIGNS:  BP 110/70, respirations 16, pulse 80  and regular.  GENERAL APPEARANCE:  This is a well-developed, well-nourished gentleman in  no acute distress.  HEENT: Head normocephalic.  Nose patent.  Ears patent.  Pupils equal, round  and reactive to light.  Throat without injection.  NECK:  Supple without adenopathy.  Carotid 2+ without bruits.  CHEST:  Clear to auscultation.  No rales or rhonchi.  Respirations 16.  CARDIAC:  Heart regular rate and rhythm at 80 beats per minute without  murmur.  ABDOMEN:  Abdomen: Soft with active bowel sounds.  No mass or organomegaly.  NEUROLOGIC:  Patient alert and oriented to time, place and person.  Cranial  nerves II-XII grossly intact.  EXTREMITIES:  The right knee with -3 degrees to 140 degrees further range of  motion.  There is crepitation throughout the range of motion and pain on  full flexion.  Neurovascular status is intact.  Dorsalis pedis and posterior  tibialis pulses are 2+.   X-rays show end-stage osteoarthritis of the right knee.   IMPRESSION:  End-stage osteoarthritis, right knee.   PLAN:  Total knee arthroplasty, right knee.      Judith Part. Chabon, P.A.    ______________________________  Cynda Familia, M.D.    SJC/MEDQ  D:  09/21/2005  T:   09/21/2005  Job:  [id number]

## 2010-05-28 NOTE — Op Note (Signed)
Anthony Bullock, Anthony Bullock                             ACCOUNT NO.:  [id number]   MEDICAL RECORD NO.:  [id number]                   PATIENT TYPE:  AMB   LOCATION:  [facility]                                 FACILITY:  [facility]   PHYSICIAN:  Hanley Ben, M.D.               DATE OF BIRTH:  [birth year]   DATE OF PROCEDURE:  02/28/2003  DATE OF DISCHARGE:                                 OPERATIVE REPORT   PREOPERATIVE DIAGNOSIS:  Urethral stricture.   POSTOPERATIVE DIAGNOSES:  1. Urethral stricture.  2. Urethral stone.   PROCEDURES:  1. Cystoscopy.  2. Visual urethrotomy.  3. Extraction of urethral stone.   SURGEON:  Hanley Ben, M.D.   ANESTHESIA:  General.   INDICATIONS:  The patient is a 49 year old male who had been complaining of  frequency, hesitancy, and voiding a small amount of urine at a time.  He has  a past history of urethral stricture.  Cystoscopy done in the office showed  a stricture in the bulbous urethra and what appears to be a stone behind the  stricture.  He is scheduled today for cystoscopy and visual urethrotomy and  if it is a urethral stone, extraction of the stone.   Under general anesthesia, the patient was prepped and draped and placed in  the dorsal lithotomy position.  A visual urethrotome was passed in the  urethra.  There was a stricture in the bulbous urethra, and the urethrotome  could not be passed in the bladder.  The stricture was incised and there is  a stone proximal to the stricture.  The urethrotome was then advanced in the  bladder and the stone was pushed back into the bladder.  The bladder is  trabeculated.  There is no tumor in the bladder.  The ureteral orifices are  in normal position and shape with clear efflux.  The stone was then  irrigated out of the bladder.  The urethrotome was then removed.  A #22  Foley catheter was then inserted in the bladder without difficulty.   The patient tolerated the procedure well and left the OR in  satisfactory  condition to postanesthesia care unit.                                               Hanley Ben, M.D.    MN/MEDQ  D:  02/28/2003  T:  02/28/2003  Job:  [id number]

## 2010-05-28 NOTE — Op Note (Signed)
NAME:  Anthony Bullock, Anthony Bullock NO.:  [id number]   MEDICAL RECORD NO.:  [id number]          PATIENT TYPE:  INP   LOCATION:  1503                         FACILITY:  [facility]   PHYSICIAN:  Cynda Familia, M.D.DATE OF BIRTH:  03-29-1960   DATE OF PROCEDURE:  09/24/2005  DATE OF DISCHARGE:                                 OPERATIVE REPORT   PREOPERATIVE DIAGNOSIS:  Right knee end-stage osteoarthritis.   POSTOPERATIVE DIAGNOSIS:  Right knee end-stage osteoarthritis.   PROCEDURE:  Right total knee arthroplasty.   SURGEON:  Jarvis Morgan, M.D.   ASSISTANT:  Judith Part. Chabon, P.A.   ANESTHESIA:  Spinal, Nena Alexander. Fortune, M.D.   ESTIMATED BLOOD LOSS:  Less than 50 mL.   DRAINS:  Two medium Hemovacs.   COMPLICATIONS:  None.   DISPOSITION:  To PACU stable.   At the time of trying to place a Foley catheter, it was found that the  patient had a urethral stricture.  He had had urologic problems in the past.  His urologist was Dr. Janice Norrie.  Dr. Janice Norrie was called.  He asked for the [facility] resident to attend the situation.  The [facility] resident came into  the room and provided Foley catheter service.  His note will be separate.   OPERATIVE DETAILS:  The patient was counseled in the holding area and the  correct side was identified, IV started, antibiotics given, a block was  administered.  Taken to the OR, placed in supine  position and a spinal  anesthetic was administered.  At this point in time initially the nurse  tried to place the Foley catheter and was unsuccessful.  We called Dr. Janice Norrie,  his urologist.  He asked the [facility] resident available.  He doctor  attended the case.  He placed the catheter.  His note was dictated  separately.  Following placing the catheter, the extremity was well-padded  and bumped.  His knee had full extension, flexed to 135.  We elevated,  prepped with DuraPrep, draped in a sterile fashion.  A standard Esmarch  tourniquet  was inflated to 300 mmHg, a straight midline incision made  through the skin and subcutaneous tissue.  Medial and lateral soft tissue  flaps were developed.  A medial parapatellar arthrotomy was performed.  A  proximal medial soft tissue release was done.  The patella was retracted out  of the way and was not everted, and the knee was flexed.  End-stage  arthritic change with bone against bone.  The cruciate ligaments were  resected, a starting hole made in the distal femur.  The canal was irrigated  until the effluent was clear.  The intramedullary rod was gently placed.  There was a 5 degree valgus cut and a 10 mm cut off the distal femur.  The  distal femur was found to be a size 4.  The cutting block was rotated and  the block was applied and the distal femur was cut to a size 4.  Medial and  lateral menisci removed.  Geniculate vessels were coagulated.  Under direct  visualization the posterior neurovascular structures were followed out and  protected throughout the entire case.  The proximal tibia was found to be a  size 4.  The central aspect was made, __________  utilized.  The canal was  irrigated until the effluent was clear and the intramedullary rod was gently  placed.  We chose a 10 mm cut based upon the lateral side, which was the  least deficient side, which gave a 4 mm cut off the defect on the medial  side for the 10 mm total resection.  This was done in a zero degree slope.  Posteromedial and __________  femoral osteophytes were removed under direct  visualization.  At this time I __________  extension blocks for the 12.5.  We had excellent balance.  The tibial base plate was applied, rotation was  set.  Reamer and punch was then performed and the femoral box cut was now  performed.  At this time with the size 4 femur, the size 4 tibia, 12.5  insert, we had excellent range of motion and soft tissue balance and  alignment, patellofemoral tracking was anatomic.  The patella  was found to  be a size 38, the appropriate amount of bone was resected and locking holes  then made for the 38 patellar button.  We had excellent tracking.  All  trials were removed.  The knee was irrigated with pulsatile lavage.  Utilizing Modern cement technique, all components were cemented into place,  a size 4 tibia, size 4 femur, with a 38 patella.  After cement was cured,  excellent cement was removed and we did 12.5 and 15 mm trial inserts, with  the 15 trial insert with excellent range of motion and soft tissue balance  to varus-valgus stress, and patellofemoral tracking was anatomic.  The trial  was removed.  The final 15 mm posterior-stabilized rotating platform tibial  insert was implanted.  Patellofemoral tracking was anatomic.  The wounds  were copiously irrigated during the closure __________  soft tissue.  Two  medium Hemovac drains were placed.  Sequential closure in layers was done,  the arthrotomy with Vicryl, subcu Vicryl, skin closed with subcuticular  Monocryl suture.  Steri-Strips were applied.  The drain was hooked to  suction.  A sterile dressing applied to the knee.  The tourniquet was  deflated with normal circulation to the foot and ankle at the end of the  case.  No complications or problems.  The patient was taken out of the  operating room to PACU in stable condition.   To decrease the surgical time and help with decision making, Mr. Richardson Landry  Chabon's assistance was needed.           ______________________________  Cynda Familia, M.D.     RAC/MEDQ  D:  09/23/2005  T:  09/24/2005  Job:  [id number]   cc:   Hanley Ben, M.D.  Fax: [phone]

## 2010-07-26 ENCOUNTER — Telehealth: Payer: Self-pay | Admitting: *Deleted

## 2010-07-26 ENCOUNTER — Telehealth: Payer: Self-pay | Admitting: Family Medicine

## 2010-07-26 NOTE — Telephone Encounter (Signed)
Spoke with patient. Recent elevation creatinine 2.59. Preoperative labs A1c 11.5% and glucose 349. Selective urethroplasty has been deferred and delayed at this time. We have instructed patient to stop metformin at this time as well as Januvia. Currently on Lantus 20 units daily. He will increase this to 25 units daily and check blood sugars at least twice daily. Discussed possibility of adding short-acting mealtime insulin but he is reluctant at this time. Increase Lantus 3 units every other day until fasting blood sugars around 130 .  Will plan to touch base by phone in a couple days to gauge progress

## 2010-07-26 NOTE — Telephone Encounter (Signed)
Abnormal labs including Cr 2.59, A1c 11.5.  Pt on insulin (Lantus plus oral meds) but hx of poor compliance.  He needs to have insulin revised.  Renal function has worsened and Cr had been around 1.5 by labs in April.  Any obstructive uropathy issues?  I left message for Dr Terlecki's nurse to call back.

## 2010-07-26 NOTE — Telephone Encounter (Signed)
Dr Terlicki's nurse Deborah Chalk called to informed of concerning pre-op labs for pt who is scheduled for uroplasty this Wednesday.   The office needs to know soon if he can still have surg, or labs will require additional evaluation. Labs were faxed this am and on doctors desk [phone] ext [phone] Pager [phone]

## 2010-08-02 ENCOUNTER — Telehealth: Payer: Self-pay | Admitting: Family Medicine

## 2010-08-02 NOTE — Telephone Encounter (Signed)
Pt called and said that his blood sugar reading have been high. Pt did not want to give any detailed. Req Dr Elease Hashimoto or nurse to call at earliest convenience.

## 2010-08-02 NOTE — Telephone Encounter (Signed)
LMTCB and leave more information

## 2010-08-03 NOTE — Telephone Encounter (Signed)
LMTCB yesterday and today.  I noted a F/U visit was cancelled in May, so I encouraged pt to schedule return OV, bring BS readings for review.

## 2010-08-04 ENCOUNTER — Telehealth: Payer: Self-pay | Admitting: *Deleted

## 2010-08-04 NOTE — Telephone Encounter (Signed)
My concern with his metformin was Creatinine over 1.5.  He would be OK to use Januvia at 50 mg daily and continue with Lantus.

## 2010-08-04 NOTE — Telephone Encounter (Signed)
Pt left a VM to report "Dr. Elease Hashimoto wanted me off Metformin, however my BS were around 397, I was not able to control his BS.  I began taking Metformin, Januvia and Lantus again.  I'm  taking 25 units of Lantus and Metformin at night and Metformin and Januvia in the AM.  I've seen a dramatic improvement, BS around 190, 150 and Tuesday morning fasting was 113.  Dr Eveline Keto wants to see me this Friday at 9:30am, I'm sure he will check my sugar and maybe reschedule my surgery.  I can be reached at [phone] before 3:30 and after 4:30 [phone]."

## 2010-08-04 NOTE — Telephone Encounter (Signed)
Pt informed and he was argumentive with not taking the Metformin.  I asked pt to not take his evening Metformin and call us tomorrow with his BS readings if they are up again.  Per Dr Elease Hashimoto, we can up the Lantus if necessary, and also said the Metformin is probably not helping his BS that much.  We will call him tomorrow if he does not call us by noon tomorrow

## 2010-08-09 NOTE — Telephone Encounter (Signed)
Pt informed on personally identified VM 

## 2010-08-09 NOTE — Telephone Encounter (Signed)
I called pt in follow-up this am,  His BS have been averaging 265 in the morning fasting, using 100 mg Januvia in the AM and Lantus 30 units in the PM.  Pt saw Dr Sol Blazing on Friday and he could not get the cath back in.  "Things did not go well and had to go to [facility] ER yesterday to have cath inserted again.  He had a UA and labs done, so hopefully Dr Elease Hashimoto will get reports.  Pt has OV scheduled with [name] Friday.

## 2010-08-09 NOTE — Telephone Encounter (Signed)
I would go ahead and titrate his Lantus to 40 units daily if his blood sugars and consistently that high and bring readings with him Friday to review.

## 2010-08-13 ENCOUNTER — Ambulatory Visit (INDEPENDENT_AMBULATORY_CARE_PROVIDER_SITE_OTHER): Payer: BC Managed Care – PPO | Admitting: Family Medicine

## 2010-08-13 ENCOUNTER — Encounter: Payer: Self-pay | Admitting: Family Medicine

## 2010-08-13 DIAGNOSIS — N181 Chronic kidney disease, stage 1: Secondary | ICD-10-CM

## 2010-08-13 DIAGNOSIS — N189 Chronic kidney disease, unspecified: Secondary | ICD-10-CM

## 2010-08-13 DIAGNOSIS — E119 Type 2 diabetes mellitus without complications: Secondary | ICD-10-CM

## 2010-08-13 LAB — BASIC METABOLIC PANEL
BUN: 19 mg/dL (ref 6–23)
CO2: 24 mEq/L (ref 19–32)
Calcium: 8.5 mg/dL (ref 8.4–10.5)
Chloride: 109 mEq/L (ref 96–112)
Creatinine, Ser: 1.5 mg/dL (ref 0.4–1.5)
GFR: 52.49 mL/min — ABNORMAL LOW (ref >60.00)
Glucose, Bld: 116 mg/dL — ABNORMAL HIGH (ref 70–99)
Potassium: 4.4 mEq/L (ref 3.5–5.1)
Sodium: 140 mEq/L (ref 135–145)

## 2010-08-13 NOTE — Progress Notes (Signed)
  Subjective:    Patient ID: Anthony Bullock, male    DOB: April 14, 1960, 50 y.o.   MRN: [id number]  HPI Patient here for followup. Had been scheduled for urethroplasty at [facility] that had elevated creatinine around 2.4 with uncontrolled diabetes. We stopped his metformin and increased his Lantus currently 40 units daily and recent fasting blood sugar from 120 which is greatly improved. He had some recent labs await for the last week with creatinine below 2. Patient had urethral catheter out last Friday and refused to have this placed back but by Sunday had some decreased volume of urination and obstructive symptoms and Sunday went to emergency room and had catheter placed back in. He is on Macrobid for UTI prevention  Feels well overall. No fever or chills. No symptoms of hyperglycemia currently   Review of Systems  Constitutional: Negative for fever and chills.  Respiratory: Negative for shortness of breath.   Cardiovascular: Negative for chest pain.  Gastrointestinal: Negative for abdominal pain.  Genitourinary: Negative for hematuria.       Objective:   Physical Exam  Constitutional: He is oriented to person, place, and time. He appears well-developed and well-nourished.  HENT:  Mouth/Throat: Oropharynx is clear and moist.  Cardiovascular: Normal rate, regular rhythm and normal heart sounds.   Pulmonary/Chest: Effort normal and breath sounds normal. No respiratory distress. He has no wheezes. He has no rales.  Musculoskeletal: He exhibits no edema.  Neurological: He is alert and oriented to person, place, and time.          Assessment & Plan:  #1 chronic kidney disease. Baseline creatinine around 1.5-1.6 with recent exacerbation secondary to obstructive issues. Recheck basic metabolic panel and continue to hold metformin #2 type 2 diabetes. Improved on Lantus. Repeat A1c in 2-3 months

## 2010-08-14 ENCOUNTER — Other Ambulatory Visit: Payer: Self-pay | Admitting: Family Medicine

## 2010-08-18 NOTE — Progress Notes (Signed)
Quick Note:  Pt informed, labs faxed to Lenoria Chime at [facility], fax # [phone] ______

## 2010-08-26 ENCOUNTER — Telehealth: Payer: Self-pay | Admitting: Family Medicine

## 2010-08-26 NOTE — Telephone Encounter (Signed)
Please notify pt I will do and have by Monday.  Send this note back to me as reminder.

## 2010-08-26 NOTE — Telephone Encounter (Signed)
The patient's urologist at [facility] is requesting a letter of surgery clearance. Please include the patient's name and date of birth. Fax to urologist that is listed in his chart/paperwork. Any ? Please call pt. Needs this letter asap. No surgery date has been set until letter is faxed.

## 2010-08-26 NOTE — Telephone Encounter (Signed)
Please advise 

## 2010-08-27 NOTE — Telephone Encounter (Signed)
Pt informed, DOB Jul 08, 1960 Lenoria Chime MD Fax [phone]

## 2010-08-30 NOTE — Telephone Encounter (Signed)
Letter faxed, confirmation received, pt informed

## 2010-08-30 NOTE — Telephone Encounter (Signed)
written

## 2010-09-23 ENCOUNTER — Encounter: Payer: Self-pay | Admitting: Family Medicine

## 2010-09-23 ENCOUNTER — Ambulatory Visit (INDEPENDENT_AMBULATORY_CARE_PROVIDER_SITE_OTHER): Payer: BC Managed Care – PPO | Admitting: Family Medicine

## 2010-09-23 DIAGNOSIS — R509 Fever, unspecified: Secondary | ICD-10-CM

## 2010-09-23 DIAGNOSIS — E119 Type 2 diabetes mellitus without complications: Secondary | ICD-10-CM

## 2010-09-23 DIAGNOSIS — R300 Dysuria: Secondary | ICD-10-CM

## 2010-09-23 LAB — POCT URINALYSIS DIPSTICK
Glucose, UA: 2000
Ketones, UA: NEGATIVE
Spec Grav, UA: 1.005
Urobilinogen, UA: 0.2
pH, UA: 6.5

## 2010-09-23 MED ORDER — NITROFURANTOIN MONOHYD MACRO 100 MG PO CAPS: 100.0000 mg | ORAL_CAPSULE | Freq: Two times a day (BID) | ORAL | Status: AC

## 2010-09-23 MED ORDER — CEFTRIAXONE SODIUM 1 G IJ SOLR
1.0000 g | INTRAMUSCULAR | Status: DC
  Administered 2010-09-23: 1 g via INTRAMUSCULAR

## 2010-09-23 NOTE — Progress Notes (Signed)
  Subjective:    Patient ID: Anthony Bullock, male    DOB: 1960-09-13, 50 y.o.   MRN: [id number]  HPI Patient seen with dysuria and fever. Onset today. Scheduled for urethroplasty at [facility] and was there for preop evaluation today and was noted to have elevated pulse and fever. Patient has catheter and bag and noted cloudy urine. No nausea or vomiting. Overall weakness. Similar presentation reportedly one month ago and was treated initially by physician's at [facility] with Septra but apparently organism was resistant. Subsequently switched to [place] and improved.  He has type 2 diabetes. Fasting blood sugar this morning 114. Other medical problems include history of hyperlipidemia, hypertension, and history of kidney stones.   Review of Systems  Constitutional: Positive for fever and fatigue. Negative for chills.  HENT: Negative for congestion and sore throat.   Respiratory: Negative for cough and shortness of breath.   Cardiovascular: Negative for chest pain and leg swelling.  Gastrointestinal: Negative for abdominal pain.  Genitourinary: Positive for dysuria. Negative for hematuria.  Neurological: Negative for headaches.  Hematological: Negative for adenopathy.       Objective:   Physical Exam  Constitutional: He appears well-developed and well-nourished.  HENT:  Right Ear: External ear normal.  Left Ear: External ear normal.  Mouth/Throat: Oropharynx is clear and moist.  Neck: Neck supple.  Cardiovascular:       Heart rate is elevated around 100 but regular  Pulmonary/Chest: Effort normal and breath sounds normal. No respiratory distress. He has no wheezes. He has no rales.  Abdominal: Soft. There is no tenderness.  Musculoskeletal: He exhibits no edema.          Assessment & Plan:  Fever probably secondary to urinary tract infection. He does not appear toxic or septic at this time and has stable blood pressure. Is very high risk with his diabetes history and  history of frequent catheterization and risk for resistant bug. Urine culture sent. Ceftriaxone 1 g given in office and patient tolerated well. Start Macrobid 1 twice a day pending culture results. Explained to patient and wife to go to ED if he has any vomiting, increased weakness or any other new symptoms

## 2010-09-23 NOTE — Patient Instructions (Signed)
Drink plenty of fluids Follow up promptly for any vomiting, confusion, or any worsening symptoms.

## 2010-09-24 ENCOUNTER — Telehealth: Payer: Self-pay | Admitting: Family Medicine

## 2010-09-24 NOTE — Telephone Encounter (Signed)
Called and left message.

## 2010-09-24 NOTE — Telephone Encounter (Signed)
Pts wife called and said that someone from [facility] tried to call them, but she was on another call. Pls try to call back asap.

## 2010-09-25 LAB — URINE CULTURE: Colony Count: >=100000

## 2010-09-29 ENCOUNTER — Ambulatory Visit (INDEPENDENT_AMBULATORY_CARE_PROVIDER_SITE_OTHER): Payer: BC Managed Care – PPO | Admitting: Family Medicine

## 2010-09-29 ENCOUNTER — Encounter: Payer: Self-pay | Admitting: Family Medicine

## 2010-09-29 VITALS — BP 148/80 | Temp 98.3°F | Wt 210.0 lb

## 2010-09-29 DIAGNOSIS — R509 Fever, unspecified: Secondary | ICD-10-CM

## 2010-09-29 NOTE — Patient Instructions (Signed)
Call me if you have any fever up to 101. Continue Macrobid.

## 2010-09-29 NOTE — Progress Notes (Signed)
Quick Note:  Pt informed on home VM ______ 

## 2010-09-29 NOTE — Progress Notes (Signed)
  Subjective:    Patient ID: Anthony Bullock, male    DOB: 09/27/1960, 50 y.o.   MRN: [id number]  HPI Recurrent fever yesterday at around 100 after work. He had some malaise but no other specific symptoms. Recent presumed urinary tract infection. Patient has history of urethral stenosis and planned upcoming urethroplasty next week. He presented here last week with high fever and foul-smelling urine. Urine culture revealed multiple species. Patient did improve promptly after Rocephin and Macrobid. He has not noted any cloudy urine since then. He denies any nausea, vomiting, diarrhea, abdominal pain, back pain, sore throat, nasal congestion, or cough. Remains on Macrobid.  No joint pain.  Past Medical History  Diagnosis Date  . Allergic rhinitis   . Diabetes mellitus type II   . Hyperlipidemia   . Hypertension   . Urethral stricture   . Kidney stones    Past Surgical History  Procedure Date  . Reapea urethral surgery for recurrent obstruction 2011  . Total knee arthroplasty 2007    reports that he quit smoking about 4 months ago. His smoking use included Cigarettes. He has a 64 pack-year smoking history. He does not have any smokeless tobacco history on file. He reports that he does not drink alcohol or use illicit drugs. family history includes Alcohol abuse in his other; Diabetes in his other; Heart disease in his other; and Stroke in his other. Allergies  Allergen Reactions  . Morphine Sulfate     REACTION: Itches all over      Review of Systems  Constitutional: Positive for fever. Negative for chills and fatigue.  HENT: Negative for ear pain, congestion, sore throat and voice change.   Respiratory: Negative for cough, shortness of breath and wheezing.   Cardiovascular: Negative for chest pain.  Gastrointestinal: Negative for abdominal pain.  Genitourinary: Negative for hematuria.  Neurological: Negative for headaches.       Objective:   Physical Exam  Constitutional: He  appears well-developed and well-nourished. No distress.  HENT:  Right Ear: External ear normal.  Left Ear: External ear normal.  Mouth/Throat: Oropharynx is clear and moist.  Neck: Neck supple.  Cardiovascular: Normal rate, regular rhythm and normal heart sounds.   Pulmonary/Chest: Effort normal and breath sounds normal. No respiratory distress. He has no wheezes. He has no rales.  Abdominal: Soft. He exhibits no mass. There is no tenderness. There is no rebound and no guarding.  Lymphadenopathy:    He has no cervical adenopathy.  Skin: No rash noted.          Assessment & Plan:  Reported fever yesterday. None today. Recent UTI currently on Macrobid. Observe for now. If the patient has confirmed recurrent fever consider imaging to rule out perinephric abscess

## 2010-10-04 ENCOUNTER — Other Ambulatory Visit: Payer: Self-pay | Admitting: Family Medicine

## 2010-10-23 ENCOUNTER — Other Ambulatory Visit: Payer: Self-pay | Admitting: Family Medicine

## 2010-11-10 ENCOUNTER — Ambulatory Visit (INDEPENDENT_AMBULATORY_CARE_PROVIDER_SITE_OTHER): Payer: BC Managed Care – PPO

## 2010-11-10 DIAGNOSIS — Z23 Encounter for immunization: Secondary | ICD-10-CM

## 2010-12-09 ENCOUNTER — Other Ambulatory Visit: Payer: Self-pay | Admitting: Family Medicine

## 2010-12-28 ENCOUNTER — Other Ambulatory Visit: Payer: Self-pay | Admitting: Family Medicine

## 2011-02-21 ENCOUNTER — Encounter: Payer: Self-pay | Admitting: Family Medicine

## 2011-02-21 ENCOUNTER — Ambulatory Visit (INDEPENDENT_AMBULATORY_CARE_PROVIDER_SITE_OTHER): Payer: BC Managed Care – PPO | Admitting: Family Medicine

## 2011-02-21 DIAGNOSIS — I10 Essential (primary) hypertension: Secondary | ICD-10-CM

## 2011-02-21 DIAGNOSIS — N181 Chronic kidney disease, stage 1: Secondary | ICD-10-CM

## 2011-02-21 DIAGNOSIS — E785 Hyperlipidemia, unspecified: Secondary | ICD-10-CM

## 2011-02-21 DIAGNOSIS — E119 Type 2 diabetes mellitus without complications: Secondary | ICD-10-CM

## 2011-02-21 LAB — BASIC METABOLIC PANEL
BUN: 22 mg/dL (ref 6–23)
CO2: 25 mEq/L (ref 19–32)
Calcium: 9.2 mg/dL (ref 8.4–10.5)
Chloride: 100 mEq/L (ref 96–112)
Creatinine, Ser: 1.7 mg/dL — ABNORMAL HIGH (ref 0.4–1.5)
GFR: 44.47 mL/min — ABNORMAL LOW (ref >60.00)
Glucose, Bld: 402 mg/dL — ABNORMAL HIGH (ref 70–99)
Potassium: 4 mEq/L (ref 3.5–5.1)
Sodium: 132 mEq/L — ABNORMAL LOW (ref 135–145)

## 2011-02-21 MED ORDER — INSULIN PEN NEEDLE 31G X 5 MM MISC: Status: DC

## 2011-02-21 MED ORDER — SIMVASTATIN 80 MG PO TABS: 80.0000 mg | ORAL_TABLET | Freq: Every day | ORAL | Status: DC

## 2011-02-21 MED ORDER — GLUCOSE BLOOD VI STRP: ORAL_STRIP | Status: DC

## 2011-02-21 MED ORDER — SITAGLIPTIN PHOSPHATE 100 MG PO TABS: 100.0000 mg | ORAL_TABLET | Freq: Every day | ORAL | Status: DC

## 2011-02-21 MED ORDER — INSULIN GLARGINE 100 UNIT/ML ~~LOC~~ SOLN: SUBCUTANEOUS | Status: DC

## 2011-02-21 MED ORDER — RAMIPRIL 10 MG PO CAPS: 10.0000 mg | ORAL_CAPSULE | Freq: Every day | ORAL | Status: DC

## 2011-02-21 MED ORDER — FENOFIBRATE 160 MG PO TABS: 160.0000 mg | ORAL_TABLET | Freq: Every day | ORAL | Status: DC

## 2011-02-21 NOTE — Patient Instructions (Signed)
Get back on Lantus 10 units daily. Titrate up 2 units every 3 days until fasting blood sugars around 130

## 2011-02-21 NOTE — Progress Notes (Signed)
  Subjective:    Patient ID: Anthony Bullock, male    DOB: [birth year], 51 y.o.   MRN: [id number]  HPI  Medical followup. Patient has not been seen since urology surgery back in October. He had problems with urethral stricture and underwent urethra reconstruction surgery.  Patient is able to urinate at this time but has been very compliant poorly compliant with medications. He had difficulty acquiring some medications has been off most of his medications for several weeks at this time. Not monitoring blood sugars. Diabetes had been poorly controlled. Stage I chronic kidney disease with creatinine to 1.5. We took him off metformin for that reason.   Past Medical History  Diagnosis Date  . Allergic rhinitis   . Diabetes mellitus type II   . Hyperlipidemia   . Hypertension   . Urethral stricture   . Kidney stones    Past Surgical History  Procedure Date  . Reapea urethral surgery for recurrent obstruction 2011  . Total knee arthroplasty 2007    reports that he quit smoking about 9 months ago. His smoking use included Cigarettes. He has a 64 pack-year smoking history. He does not have any smokeless tobacco history on file. He reports that he does not drink alcohol or use illicit drugs. family history includes Alcohol abuse in his other; Diabetes in his other; Heart disease in his other; and Stroke in his other. Allergies  Allergen Reactions  . Morphine Sulfate     REACTION: Itches all over      Review of Systems  Constitutional: Negative for fatigue.  Eyes: Negative for visual disturbance.  Respiratory: Negative for cough, chest tightness and shortness of breath.   Cardiovascular: Negative for chest pain, palpitations and leg swelling.  Genitourinary: Negative for dysuria and hematuria.  Neurological: Negative for dizziness, syncope, weakness, light-headedness and headaches.       Objective:   Physical Exam  Constitutional: He appears well-developed and well-nourished.  HENT:    Mouth/Throat: Oropharynx is clear and moist.  Neck: Neck supple. No thyromegaly present.  Cardiovascular: Normal rate and regular rhythm.   No murmur heard. Pulmonary/Chest: Effort normal and breath sounds normal. No respiratory distress. He has no wheezes. He has no rales.  Musculoskeletal: He exhibits no edema.       Feet reveal no skin lesions. Good distal foot pulses. Good capillary refill. No calluses. Normal sensation with monofilament testing   Lymphadenopathy:    He has no cervical adenopathy.  Psychiatric: He has a normal mood and affect. His behavior is normal.        Assessment & Plan:   #1 type 2 diabetes. We elected not to check A1c today as he has been off all medications. Refills for medications given and recheck A1c in 2-3 months  #2 hyperlipidemia. Get back on simvastatin recheck lipids at f/u .  Refills for medication given #3 history of urethral stricture status post recent surgery improved  #4 hypertension. Not at goal but currently not on medication.  Refills for Altace

## 2011-02-22 NOTE — Progress Notes (Signed)
Quick Note:  Pt informed on home VM ______ 

## 2011-02-23 ENCOUNTER — Telehealth: Payer: Self-pay | Admitting: Family Medicine

## 2011-02-23 NOTE — Telephone Encounter (Signed)
Patient states he need his generic for lipitor changed to something less expensive. Please advise.

## 2011-02-23 NOTE — Telephone Encounter (Signed)
I left a detailed message on home VM to clarify his request with return PC

## 2011-02-23 NOTE — Telephone Encounter (Signed)
He is currently on simvastatin per our records. Please clarify

## 2011-02-23 NOTE — Telephone Encounter (Signed)
I do not see generic Lipitor?  Please advise

## 2011-02-24 NOTE — Telephone Encounter (Signed)
Pt is requesting  An alternative for fenofibrate (Tricor).  He is being charged $50.00 for #90

## 2011-02-24 NOTE — Telephone Encounter (Signed)
Pt informed, he will plan to stay on the fenofibrate then.

## 2011-02-24 NOTE — Telephone Encounter (Signed)
Does he know if his insurance has a lower alternative?  Lopid might be less expensive but has higher risk of interaction with his statin. Fenofibrate is generally generic used.  My priority would be for him to maintain his Simvastatin even if he can't take the fenofibrate.

## 2011-04-25 ENCOUNTER — Ambulatory Visit (INDEPENDENT_AMBULATORY_CARE_PROVIDER_SITE_OTHER): Payer: BC Managed Care – PPO | Admitting: Family Medicine

## 2011-04-25 ENCOUNTER — Encounter: Payer: Self-pay | Admitting: Family Medicine

## 2011-04-25 VITALS — BP 140/82 | Temp 97.6°F | Wt 204.0 lb

## 2011-04-25 DIAGNOSIS — I10 Essential (primary) hypertension: Secondary | ICD-10-CM

## 2011-04-25 DIAGNOSIS — E119 Type 2 diabetes mellitus without complications: Secondary | ICD-10-CM

## 2011-04-25 DIAGNOSIS — N181 Chronic kidney disease, stage 1: Secondary | ICD-10-CM

## 2011-04-25 DIAGNOSIS — E785 Hyperlipidemia, unspecified: Secondary | ICD-10-CM

## 2011-04-25 DIAGNOSIS — R300 Dysuria: Secondary | ICD-10-CM

## 2011-04-25 LAB — LIPID PANEL
Cholesterol: 207 mg/dL — ABNORMAL HIGH (ref 0–200)
HDL: 38 mg/dL — ABNORMAL LOW (ref >39.00)
Total CHOL/HDL Ratio: 5
Triglycerides: 212 mg/dL — ABNORMAL HIGH (ref 0.0–149.0)
VLDL: 42.4 mg/dL — ABNORMAL HIGH (ref 0.0–40.0)

## 2011-04-25 LAB — POCT URINALYSIS DIPSTICK
Bilirubin, UA: NEGATIVE
Glucose, UA: 2000
Ketones, UA: NEGATIVE
Nitrite, UA: NEGATIVE
Spec Grav, UA: 1.02
Urobilinogen, UA: 0.2
pH, UA: 6

## 2011-04-25 LAB — HEPATIC FUNCTION PANEL
ALT: 23 U/L (ref 0–53)
AST: 18 U/L (ref 0–37)
Albumin: 3.9 g/dL (ref 3.5–5.2)
Alkaline Phosphatase: 84 U/L (ref 39–117)
Bilirubin, Direct: 0.1 mg/dL (ref 0.0–0.3)
Total Bilirubin: 0.4 mg/dL (ref 0.3–1.2)
Total Protein: 7.7 g/dL (ref 6.0–8.3)

## 2011-04-25 LAB — BASIC METABOLIC PANEL
BUN: 23 mg/dL (ref 6–23)
CO2: 22 mEq/L (ref 19–32)
Calcium: 9.3 mg/dL (ref 8.4–10.5)
Chloride: 101 mEq/L (ref 96–112)
Creatinine, Ser: 1.9 mg/dL — ABNORMAL HIGH (ref 0.4–1.5)
GFR: 41.15 mL/min — ABNORMAL LOW (ref >60.00)
Glucose, Bld: 379 mg/dL — ABNORMAL HIGH (ref 70–99)
Potassium: 4.3 mEq/L (ref 3.5–5.1)
Sodium: 134 mEq/L — ABNORMAL LOW (ref 135–145)

## 2011-04-25 LAB — LDL CHOLESTEROL, DIRECT: Direct LDL: 134 mg/dL

## 2011-04-25 LAB — HEMOGLOBIN A1C: Hgb A1c MFr Bld: 16.2 % — ABNORMAL HIGH (ref 4.6–6.5)

## 2011-04-25 MED ORDER — NYSTATIN 100000 UNIT/GM EX CREA: TOPICAL_CREAM | Freq: Two times a day (BID) | CUTANEOUS | Status: AC

## 2011-04-25 MED ORDER — INSULIN LISPRO 100 UNIT/ML ~~LOC~~ SOLN: 5.0000 [IU] | Freq: Three times a day (TID) | SUBCUTANEOUS | Status: DC

## 2011-04-25 NOTE — Progress Notes (Signed)
  Subjective:    Patient ID: Anthony Bullock, male    DOB: 1960-11-03, 51 y.o.   MRN: [id number]  HPI  Followup multiple medical problems. Type 2 diabetes, hypertension, hyperlipidemia, chronic kidney disease. Recent surgery urethral stricture. He has some mild dysuria this time with a different odor from urine and mild burning intermittently. No fever or chills. Blood sugars poorly controlled. Recent fasting sugars around 300. Currently Lantus 50 units once daily and also takes Januvia. We discontinued metformin because of creatinine 1.5. He does not take any insulin with meals.  Recently has had some tingling sensation in the feet related to poor diabetes control.  Past Medical History  Diagnosis Date  . Allergic rhinitis   . Diabetes mellitus type II   . Hyperlipidemia   . Hypertension   . Urethral stricture   . Kidney stones    Past Surgical History  Procedure Date  . Reapea urethral surgery for recurrent obstruction 2011  . Total knee arthroplasty 2007    reports that he quit smoking about a year ago. His smoking use included Cigarettes. He has a 64 pack-year smoking history. He does not have any smokeless tobacco history on file. He reports that he does not drink alcohol or use illicit drugs. family history includes Alcohol abuse in his other; Diabetes in his other; Heart disease in his other; and Stroke in his other. Allergies  Allergen Reactions  . Morphine Sulfate     REACTION: Itches all over      Review of Systems  Constitutional: Negative for fatigue.  Eyes: Negative for visual disturbance.  Respiratory: Negative for cough, chest tightness and shortness of breath.   Cardiovascular: Negative for chest pain, palpitations and leg swelling.  Neurological: Negative for dizziness, syncope, weakness, light-headedness and headaches.       Objective:   Physical Exam  Constitutional: He appears well-developed and well-nourished.  HENT:  Mouth/Throat: Oropharynx is clear  and moist.  Neck: Neck supple. No thyromegaly present.  Cardiovascular: Normal rate and regular rhythm.   Pulmonary/Chest: Effort normal and breath sounds normal. No respiratory distress. He has no wheezes. He has no rales.  Musculoskeletal: He exhibits no edema.       Feet reveal no skin lesions. Good distal foot pulses. Good capillary refill. No calluses. Normal sensation with monofilament testing           Assessment & Plan:  #1 type 2 diabetes with recent poor control. Add Humalog insulin with meals 5-7 units and titration regimen given for Lantus. Recheck A1c today #2 hyperlipidemia. Check lipid and hepatic panel  #3 hypertension improved and controlled by home readings  #4 dysuria. Rule out UTI

## 2011-04-25 NOTE — Patient Instructions (Signed)
Titrate Lantus up 2 units every other day until fasting blood sugars consistently around 130 or less We will add short-acting insulin with Humulin 5-7 units prior to largest meals of the day

## 2011-04-26 NOTE — Progress Notes (Signed)
Quick Note:  Pt informed on home VM ______ 

## 2011-04-27 ENCOUNTER — Telehealth: Payer: Self-pay | Admitting: Family Medicine

## 2011-04-27 MED ORDER — GLUCOSE BLOOD VI STRP: ORAL_STRIP | Status: DC

## 2011-04-27 NOTE — Telephone Encounter (Signed)
Patient called stating that he need an rx for the accu-check smart view test strips called into the [facility] in [place]. Please assist.

## 2011-05-02 ENCOUNTER — Telehealth: Payer: Self-pay | Admitting: Family Medicine

## 2011-05-02 MED ORDER — GLUCOSE BLOOD VI STRP: ORAL_STRIP | Status: DC

## 2011-05-02 NOTE — Telephone Encounter (Signed)
Patient called stating that he need an rx for accu check smart view test [facility] Fax # [phone]. Please assist.

## 2011-07-28 ENCOUNTER — Telehealth: Payer: Self-pay | Admitting: *Deleted

## 2011-07-28 ENCOUNTER — Ambulatory Visit: Payer: BC Managed Care – PPO | Admitting: Family Medicine

## 2011-07-28 NOTE — Telephone Encounter (Signed)
Pt was a No Show for 3 month follow-up visit today, message left on pt personally identified cell VM

## 2011-08-04 ENCOUNTER — Ambulatory Visit (INDEPENDENT_AMBULATORY_CARE_PROVIDER_SITE_OTHER): Payer: BC Managed Care – PPO | Admitting: Family Medicine

## 2011-08-04 ENCOUNTER — Encounter: Payer: Self-pay | Admitting: Family Medicine

## 2011-08-04 VITALS — BP 138/72 | Temp 97.6°F | Wt 200.0 lb

## 2011-08-04 DIAGNOSIS — R300 Dysuria: Secondary | ICD-10-CM

## 2011-08-04 DIAGNOSIS — E119 Type 2 diabetes mellitus without complications: Secondary | ICD-10-CM

## 2011-08-04 DIAGNOSIS — G629 Polyneuropathy, unspecified: Secondary | ICD-10-CM

## 2011-08-04 DIAGNOSIS — G589 Mononeuropathy, unspecified: Secondary | ICD-10-CM

## 2011-08-04 LAB — POCT URINALYSIS DIPSTICK
Bilirubin, UA: 1.03
Clarity, UA: NEGATIVE
Color, UA: 2000
Glucose, UA: NEGATIVE
Ketones, UA: NEGATIVE
Spec Grav, UA: >=1.03
Urobilinogen, UA: 0.2
pH, UA: 6

## 2011-08-04 MED ORDER — GABAPENTIN 300 MG PO CAPS: ORAL_CAPSULE | ORAL | Status: DC

## 2011-08-04 MED ORDER — CIPROFLOXACIN HCL 500 MG PO TABS: 500.0000 mg | ORAL_TABLET | Freq: Two times a day (BID) | ORAL | Status: AC

## 2011-08-04 NOTE — Patient Instructions (Addendum)
Followup immediately for any increased fever, vomiting, or worsening urinary symptoms Titrate Lantus up 2 units every 3 days until fasting blood sugars consistently around 130 or less Reduce Humalog to 8-10 units with each meal Gradually reduced [facility] intake

## 2011-08-04 NOTE — Progress Notes (Signed)
  Subjective:    Patient ID: Anthony Bullock, male    DOB: 24-May-1960, 51 y.o.   MRN: [id number]  HPI  Here for several items. 2 month history of off and on burning with urination. No fever. No back pain. Denies nausea or vomiting. He has history of urethral stricture and has had surgery for the past year regarding that-urethra reconstruction surgery. History of recurrent UTI. Also has poorly controlled diabetes. Has tolerated Cipro in the past without difficulty  Type 2 diabetes. Drinks about 5 [employer] drinks per day. Very poor compliance. Currently Lantus 30 units daily and Humalog 10-15 units per meal. Occasional nighttime hypoglycemia. Fasting blood sugars around 200 or higher.  He is describing bilateral sharp pains which radiate from round to lower thigh down to the leg. Not related tp ambulation. Does complain of some lower extremity generalized weakness recently. Occasional burning sensation.  Past Medical History  Diagnosis Date  . Allergic rhinitis   . Diabetes mellitus type II   . Hyperlipidemia   . Hypertension   . Urethral stricture   . Kidney stones    Past Surgical History  Procedure Date  . Reapea urethral surgery for recurrent obstruction 2011  . Total knee arthroplasty 2007    reports that he quit smoking about 14 months ago. His smoking use included Cigarettes. He has a 64 pack-year smoking history. He does not have any smokeless tobacco history on file. He reports that he does not drink alcohol or use illicit drugs. family history includes Alcohol abuse in his other; Diabetes in his other; Heart disease in his other; and Stroke in his other. Allergies  Allergen Reactions  . Morphine Sulfate     REACTION: Itches all over      Review of Systems  Constitutional: Positive for fatigue. Negative for fever, chills, appetite change and unexpected weight change.  Gastrointestinal: Negative for nausea, vomiting and abdominal pain.  Genitourinary: Positive for  dysuria.  Musculoskeletal: Positive for arthralgias. Negative for back pain.  Neurological: Negative for dizziness.       Objective:   Physical Exam  Constitutional: He appears well-developed and well-nourished.  HENT:  Mouth/Throat: Oropharynx is clear and moist.  Neck: Neck supple. No thyromegaly present.  Cardiovascular: Normal rate and regular rhythm.   Pulmonary/Chest: Effort normal and breath sounds normal. No respiratory distress. He has no wheezes. He has no rales.  Musculoskeletal: He exhibits no edema.          Assessment & Plan:  #1 dysuria. Suspect UTI based on dipstick. Urine culture sent. Cipro 500 mg twice a day for 10 days #2 poorly controlled diabetes. Titration regimen for Lantus given. Reduce Humalog at meals to reduce hypoglycemia. Set up with diabetes educator #3 bilateral leg pain. Suspect diabetic neuropathy. Start gabapentin.

## 2011-08-07 LAB — URINE CULTURE: Colony Count: >=100000

## 2011-08-08 NOTE — Progress Notes (Signed)
Quick Note:  Attempt to call hm - "mailbox full" , attempt to call cell# - "cant leave msg" ______

## 2011-08-09 NOTE — Progress Notes (Signed)
Quick Note:  Pt informed on home VM ______ 

## 2011-08-25 ENCOUNTER — Encounter: Payer: BC Managed Care – PPO | Attending: Family Medicine | Admitting: *Deleted

## 2011-08-25 ENCOUNTER — Encounter: Payer: Self-pay | Admitting: *Deleted

## 2011-08-25 VITALS — Ht 71.0 in | Wt 207.0 lb

## 2011-08-25 DIAGNOSIS — E119 Type 2 diabetes mellitus without complications: Secondary | ICD-10-CM

## 2011-08-25 DIAGNOSIS — Z713 Dietary counseling and surveillance: Secondary | ICD-10-CM | POA: Insufficient documentation

## 2011-08-25 NOTE — Patient Instructions (Addendum)
Try Newman's Own Whitewheat bread] Try Pepsi Next Try Smart Balance light maragarine   Goals:  Follow Diabetes Meal Plan as instructed  Eat 3 meals and 2 snacks, every 3-5 hrs  Limit carbohydrate intake to 45-60 grams carbohydrate/meal  Limit carbohydrate intake to 15 grams carbohydrate/snack  Add lean protein foods to meals/snacks  Monitor glucose levels as instructed by your doctor  Aim for 20 mins of physical activity daily- chair exercises  Bring food record and glucose log to your next nutrition visit

## 2011-08-25 NOTE — Progress Notes (Signed)
  Medical Nutrition Therapy:  Appt start time: [id number] end time:  1630.   Assessment:  Primary concerns today: diabetes.   MEDICATIONS: see list   DIETARY INTAKE:  Usual eating pattern includes 3 meals and 2 snacks per day.  Everyday foods include fatty meats, starches.  Avoided foods include fruits and vegetables.    24-hr recall:  B ( AM): sausage and egg biscuit or bacon and egg bisuit  Snk ( AM): none  L ( PM): cheese and crackers or peanut butter and crackers Snk ( PM): peanuts and bologna sandwich D ( PM): pork products, steak, potatoes, not many vegetables Snk ( PM): none Beverages: 4-6 20 oz mountain dew- doesn't like diet drink or flavored water  Usual physical activity: none; pain in knees  Estimated energy needs: 1600-1800 calories 180 g carbohydrates 120 g protein 44 g fat  Progress Towards Goal(s):  In progress.   Nutritional Diagnosis:  NB-1.3 Not ready for diet/lifestyle change  As related to diabetic meal planning.  As evidenced by HgA1C of 16.4% and self-reported refusal to change diet..    Intervention:  Nutrition counseling provided. Yaman has had diabetes for about 15 years.  He's received some education, but has not been compliant with recommendations.  He currently is very reluctant to make lifestyle changes.  However, his health has declined over the past decade, on account of his uncontrolled diabetes and he may be slightly more willing to make some modifications.  His biggest issue is excessive soda consumption and he says he won't give that up.  Reviewed carb counting and encouraged him to limit his carbs to 45g/meal.  Also encouraged reduction in dietary fats, which he was reluctant to do, and to reduce his portions, which he was recluctant to do.  Encouraged more fiber and lean proteins.  Discussed MyPlate recommendations.    Handouts given during visit include: Chair exercise Carb Counting and Food Label handouts Meal Plan  Card  Monitoring/Evaluation:  Dietary intake, exercise, HgA1C, and body weight in 3 month(s).  Armarion will call to make appointment after MD office visit in October

## 2011-09-08 ENCOUNTER — Ambulatory Visit (INDEPENDENT_AMBULATORY_CARE_PROVIDER_SITE_OTHER): Payer: BC Managed Care – PPO | Admitting: Family Medicine

## 2011-09-08 ENCOUNTER — Encounter: Payer: Self-pay | Admitting: Family Medicine

## 2011-09-08 VITALS — BP 130/70 | Temp 98.2°F | Wt 208.0 lb

## 2011-09-08 DIAGNOSIS — E1142 Type 2 diabetes mellitus with diabetic polyneuropathy: Secondary | ICD-10-CM

## 2011-09-08 DIAGNOSIS — E1140 Type 2 diabetes mellitus with diabetic neuropathy, unspecified: Secondary | ICD-10-CM

## 2011-09-08 DIAGNOSIS — E119 Type 2 diabetes mellitus without complications: Secondary | ICD-10-CM

## 2011-09-08 DIAGNOSIS — N529 Male erectile dysfunction, unspecified: Secondary | ICD-10-CM

## 2011-09-08 DIAGNOSIS — E1149 Type 2 diabetes mellitus with other diabetic neurological complication: Secondary | ICD-10-CM

## 2011-09-08 MED ORDER — SILDENAFIL CITRATE 100 MG PO TABS: 50.0000 mg | ORAL_TABLET | Freq: Every day | ORAL | Status: DC | PRN

## 2011-09-08 NOTE — Progress Notes (Signed)
  Subjective:    Patient ID: Anthony Bullock, male    DOB: 1960-02-24, 51 y.o.   MRN: [id number]  HPI  medical followup. Diabetic neuropathy pain. Started gabapentin and has seen some improvement but still has some pain intermittently. More than anything is having ongoing right knee pain. Prior history of knee replacement. He is considering orthopedic followup.  Type 2 diabetes. Poorly controlled. Very poor compliance. Recent A1c 16.2%. We referred to dietitian. Has slowly made some dietary changes. Still poor compliance with diet. Titrated Lantus up to 80 units daily and taking Humalog generally 10 units 3 times a day with meals. Blood sugar is greatly improved. 30 day average 128.  Recent dysuria. Urine culture grew out Proteus. Resolved with Cipro.  Issues with erectile dysfunction. Requesting prescription for Viagra. Has not generally done well with these medications in the past. No history of nitroglycerin use  Past Medical History  Diagnosis Date  . Allergic rhinitis   . Diabetes mellitus type II   . Hyperlipidemia   . Hypertension   . Urethral stricture   . Kidney stones    Past Surgical History  Procedure Date  . Reapea urethral surgery for recurrent obstruction 2011  . Total knee arthroplasty 2007    reports that he quit smoking about 15 months ago. His smoking use included Cigarettes. He has a 64 pack-year smoking history. He does not have any smokeless tobacco history on file. He reports that he does not drink alcohol or use illicit drugs. family history includes Alcohol abuse in his other; Diabetes in his other; Heart disease in his other; and Stroke in his other. Allergies  Allergen Reactions  . Morphine Sulfate     REACTION: Itches all over     Review of Systems  Constitutional: Negative for fatigue.  Eyes: Negative for visual disturbance.  Respiratory: Negative for cough, chest tightness and shortness of breath.   Cardiovascular: Negative for chest pain,  palpitations and leg swelling.  Neurological: Negative for dizziness, syncope, weakness, light-headedness and headaches.       Objective:   Physical Exam  Constitutional: He appears well-developed and well-nourished.  Neck: Neck supple. No thyromegaly present.  Cardiovascular: Normal rate, regular rhythm and normal heart sounds.   No murmur heard. Pulmonary/Chest: Effort normal and breath sounds normal. No respiratory distress. He has no wheezes. He has no rales.  Musculoskeletal:       Feet reveal no skin lesions. Good distal foot pulses. Good capillary refill. No calluses. Normal sensation with monofilament testing           Assessment & Plan:  #1 type 2 diabetes.  History of very poor control /poor compliance. By recent changes blood sugars greatly improved. Patient request recheck A1c in 2 months. Overall feels much better  #2 diabetic neuropathy. Improved. Continue gabapentin- titrate to 3 times a day  #3 recent dysuria and urine infection. Improved following Cipro  #4 erectile dysfunction. Trial Viagra 100 mg daily as needed

## 2011-11-10 ENCOUNTER — Ambulatory Visit (INDEPENDENT_AMBULATORY_CARE_PROVIDER_SITE_OTHER): Payer: BC Managed Care – PPO | Admitting: Family Medicine

## 2011-11-10 ENCOUNTER — Encounter: Payer: Self-pay | Admitting: Family Medicine

## 2011-11-10 VITALS — BP 138/82 | Temp 97.5°F | Wt 217.0 lb

## 2011-11-10 DIAGNOSIS — N181 Chronic kidney disease, stage 1: Secondary | ICD-10-CM

## 2011-11-10 DIAGNOSIS — E119 Type 2 diabetes mellitus without complications: Secondary | ICD-10-CM

## 2011-11-10 DIAGNOSIS — Z23 Encounter for immunization: Secondary | ICD-10-CM

## 2011-11-10 DIAGNOSIS — I10 Essential (primary) hypertension: Secondary | ICD-10-CM

## 2011-11-10 LAB — BASIC METABOLIC PANEL
BUN: 19 mg/dL (ref 6–23)
CO2: 25 mEq/L (ref 19–32)
Calcium: 8.4 mg/dL (ref 8.4–10.5)
Chloride: 105 mEq/L (ref 96–112)
Creatinine, Ser: 1.6 mg/dL — ABNORMAL HIGH (ref 0.4–1.5)
GFR: 48.15 mL/min — ABNORMAL LOW (ref >60.00)
Glucose, Bld: 180 mg/dL — ABNORMAL HIGH (ref 70–99)
Potassium: 4 mEq/L (ref 3.5–5.1)
Sodium: 136 mEq/L (ref 135–145)

## 2011-11-10 LAB — HEMOGLOBIN A1C: Hgb A1c MFr Bld: 9.8 % — ABNORMAL HIGH (ref 4.6–6.5)

## 2011-11-10 MED ORDER — INSULIN PEN NEEDLE 32G X 4 MM MISC: Status: DC

## 2011-11-10 MED ORDER — GABAPENTIN 300 MG PO CAPS: 300.0000 mg | ORAL_CAPSULE | Freq: Three times a day (TID) | ORAL | Status: DC

## 2011-11-10 MED ORDER — INSULIN LISPRO 100 UNIT/ML ~~LOC~~ SOLN: 5.0000 [IU] | Freq: Three times a day (TID) | SUBCUTANEOUS | Status: DC

## 2011-11-10 NOTE — Progress Notes (Signed)
  Subjective:    Patient ID: Anthony Bullock, male    DOB: 25-Aug-1960, 51 y.o.   MRN: [id number]  HPI  Followup type 2 diabetes. Very poor control. Last A1c over 16%.  Went to nutritionist once but unfortunately insurance did not cover so he's not been back. He has not made much dietary change. Trying to scale back starches. Still drinking about 5 mountain dews per day. Blood sugars have improved greatly. Titration of Lantus up to 80 units and Humalog 10 units with each meal. 14 day average of blood sugars 156 and 30 day average 167. He has gained some weight and less urine frequency. Blurred vision has improved as well. We started gabapentin for neuropathy symptoms and this is greatly improved. Still some fatigue issues. No recurrent UTI symptoms. He has chronic kidney disease with baseline creatinine of 1.8-1.9. No peripheral edema issues. No major hypoglycemia symptoms.  Past Medical History  Diagnosis Date  . Allergic rhinitis   . Diabetes mellitus type II   . Hyperlipidemia   . Hypertension   . Urethral stricture   . Kidney stones    Past Surgical History  Procedure Date  . Reapea urethral surgery for recurrent obstruction 2011  . Total knee arthroplasty 2007    reports that he quit smoking about 18 months ago. His smoking use included Cigarettes. He has a 64 pack-year smoking history. He does not have any smokeless tobacco history on file. He reports that he does not drink alcohol or use illicit drugs. family history includes Alcohol abuse in his other; Diabetes in his other; Heart disease in his other; and Stroke in his other. Allergies  Allergen Reactions  . Morphine Sulfate     REACTION: Itches all over      Review of Systems  Constitutional: Negative for fatigue.  Eyes: Negative for visual disturbance.  Respiratory: Negative for cough, chest tightness and shortness of breath.   Cardiovascular: Negative for chest pain, palpitations and leg swelling.  Neurological: Negative  for dizziness, syncope, weakness, light-headedness and headaches.       Objective:   Physical Exam  Constitutional: He appears well-developed and well-nourished.  Neck: Neck supple. No thyromegaly present.  Cardiovascular: Normal rate and regular rhythm.   Pulmonary/Chest: Effort normal and breath sounds normal. No respiratory distress. He has no wheezes. He has no rales.  Musculoskeletal: He exhibits no edema.       Feet reveal no skin lesions. Good distal foot pulses. Good capillary refill. No calluses. Normal sensation with monofilament testing           Assessment & Plan:  #1 type 2 diabetes. History of poor control. By home readings though starting to improve greatly. Recheck A1c. Continue yearly eye exam. #2 hypertension. Stable.  #3 chronic kidney disease. Recheck basic metabolic panel. Consider nephrology referral, especially if this is increasing further. #4 health maintenance. Flu vaccine given

## 2011-11-11 NOTE — Progress Notes (Signed)
Quick Note:  Pt informed on personally identified VM ______ 

## 2011-12-20 ENCOUNTER — Telehealth: Payer: Self-pay | Admitting: Family Medicine

## 2011-12-20 NOTE — Telephone Encounter (Signed)
Patient called stating that he need a refill of his lantus solostar pens 100units 80 units once a day [facility]. Please assist.

## 2011-12-21 MED ORDER — INSULIN GLARGINE 100 UNIT/ML ~~LOC~~ SOLN: SUBCUTANEOUS | Status: DC

## 2011-12-26 ENCOUNTER — Encounter: Payer: Self-pay | Admitting: Family Medicine

## 2011-12-26 ENCOUNTER — Ambulatory Visit (INDEPENDENT_AMBULATORY_CARE_PROVIDER_SITE_OTHER): Payer: BC Managed Care – PPO | Admitting: Family Medicine

## 2011-12-26 ENCOUNTER — Telehealth: Payer: Self-pay | Admitting: Family Medicine

## 2011-12-26 VITALS — BP 130/80 | Temp 98.0°F | Wt 214.0 lb

## 2011-12-26 DIAGNOSIS — R300 Dysuria: Secondary | ICD-10-CM

## 2011-12-26 DIAGNOSIS — B372 Candidiasis of skin and nail: Secondary | ICD-10-CM

## 2011-12-26 LAB — POCT URINALYSIS DIPSTICK
Bilirubin, UA: NEGATIVE
Glucose, UA: 2000
Ketones, UA: NEGATIVE
Leukocytes, UA: NEGATIVE
Nitrite, UA: NEGATIVE
Spec Grav, UA: 1.01
Urobilinogen, UA: 0.2
pH, UA: 5

## 2011-12-26 MED ORDER — FLUCONAZOLE 100 MG PO TABS: 100.0000 mg | ORAL_TABLET | Freq: Every day | ORAL | Status: DC

## 2011-12-26 NOTE — Progress Notes (Signed)
  Subjective:    Patient ID: Anthony Bullock, male    DOB: 23-May-1960, 51 y.o.   MRN: [id number]  HPI  Patient has history of type 2 diabetes which is been poorly controlled, hypertension, hyperlipidemia. He states this past Thanksgiving he went to emergency room in [facility]. Left parotid swelling. CT scan revealed stone. Placed on antibiotic -possibly Keflex. By Saturday he describes probable yeast balanitis. He describes pruritus and whitish discharge around the glans of the penis. Patient has had some urine frequency after running out of insulin about one week ago. Was seeing improved control prior to that time. No burning with urination.  Past Medical History  Diagnosis Date  . Allergic rhinitis   . Diabetes mellitus type II   . Hyperlipidemia   . Hypertension   . Urethral stricture   . Kidney stones    Past Surgical History  Procedure Date  . Reapea urethral surgery for recurrent obstruction 2011  . Total knee arthroplasty 2007    reports that he quit smoking about 19 months ago. His smoking use included Cigarettes. He has a 64 pack-year smoking history. He does not have any smokeless tobacco history on file. He reports that he does not drink alcohol or use illicit drugs. family history includes Alcohol abuse in his other; Diabetes in his other; Heart disease in his other; and Stroke in his other. Allergies  Allergen Reactions  . Morphine Sulfate     REACTION: Itches all over      Review of Systems  Constitutional: Negative for fever and chills.  Genitourinary: Negative for dysuria.       Objective:   Physical Exam  Constitutional: He appears well-developed and well-nourished.  Cardiovascular: Normal rate and regular rhythm.   Pulmonary/Chest: Effort normal and breath sounds normal. No respiratory distress. He has no wheezes. He has no rales.  Skin:       Mild erythema involving the shaft of the penis and has some whitish colored discharge in the base of penis.  Glands appears relatively normal. No vesicles. Nontender.          Assessment & Plan:  Yeast infection. Risk factors include poorly controlled diabetes and recent antibiotic use. Get back on Lantus insulin with sample given. Continue nystatin cream twice daily and keep areas dry as possible. If this is not continuing to improve next few days add fluconazole 100 mg daily for 7 days

## 2011-12-26 NOTE — Patient Instructions (Addendum)
If you do go on the fluconazole, hold Simvastatin during those 7 days Continue with Nystatin cream. Keep area as dry as possible.

## 2011-12-26 NOTE — Telephone Encounter (Signed)
Ref no. [id number]  .Pls call concerning script for Lantis.

## 2011-12-27 ENCOUNTER — Telehealth: Payer: Self-pay | Admitting: *Deleted

## 2011-12-27 NOTE — Telephone Encounter (Signed)
Pt states [facility] called him and said they need to talk to Dr Elease Hashimoto before they will fill his Lantis. Pls advise.  Pt is also out of his meds.

## 2011-12-30 NOTE — Telephone Encounter (Signed)
OMTCB with additional information with what is the issue/problem?

## 2012-01-02 MED ORDER — INSULIN GLARGINE 100 UNIT/ML ~~LOC~~ SOLN: SUBCUTANEOUS | Status: DC

## 2012-01-02 NOTE — Telephone Encounter (Signed)
Pt needs lantus  Enough for 30 day supply call into [facility] [place]. [facility] is [facility]. Pt is out of lantus

## 2012-02-10 ENCOUNTER — Encounter: Payer: Self-pay | Admitting: Family Medicine

## 2012-02-10 ENCOUNTER — Ambulatory Visit (INDEPENDENT_AMBULATORY_CARE_PROVIDER_SITE_OTHER): Payer: BC Managed Care – PPO | Admitting: Family Medicine

## 2012-02-10 VITALS — BP 120/82 | Temp 98.7°F | Wt 210.0 lb

## 2012-02-10 DIAGNOSIS — E1149 Type 2 diabetes mellitus with other diabetic neurological complication: Secondary | ICD-10-CM

## 2012-02-10 DIAGNOSIS — E1142 Type 2 diabetes mellitus with diabetic polyneuropathy: Secondary | ICD-10-CM | POA: Insufficient documentation

## 2012-02-10 LAB — HM DIABETES FOOT EXAM: HM Diabetic Foot Exam: NORMAL

## 2012-02-10 MED ORDER — INSULIN GLARGINE 100 UNIT/ML ~~LOC~~ SOLN: SUBCUTANEOUS | Status: DC

## 2012-02-10 NOTE — Addendum Note (Signed)
Addended by: Jill Side on: 02/10/2012 09:51 AM   Modules accepted: Orders

## 2012-02-10 NOTE — Patient Instructions (Addendum)
Get back on Lantus 80 units daily and titrate Lantus up 2 units every other day until fasting sugars consistently < 130.

## 2012-02-10 NOTE — Progress Notes (Signed)
Subjective:    Patient ID: Anthony Bullock, male    DOB: 09-10-1960, 52 y.o.   MRN: [id number]  HPI Patient is seen for followup regarding chronic medical problems. He has type 2 diabetes, hypertension, hyperlipidemia, chronic kidney disease, history of kidney stones. His blood sugars have been very poorly controlled which he relates to poor compliance with insulin secondary to difficulty getting medication through insurance. [facility] and states he's had problems getting the Lantus through them after repeated calls. He is currently early taking Humalog 5 units with meals and Januvia. Patient relates fasting blood sugars recently over 300. Last A1c 9.8%. He has some thirst and urine frequency and 4 pound weight loss since last visit. He has been fully compliant with other medications. Blood pressures have been well controlled. No chest pains. No dizziness.  Recent yeast balanitis has fully cleared. He uses nystatin cream intermittently as needed. Denies any recent visual changes. He has history of peripheral neuropathy pains which are well controlled with gabapentin  Past Medical History  Diagnosis Date  . Allergic rhinitis   . Diabetes mellitus type II   . Hyperlipidemia   . Hypertension   . Urethral stricture   . Kidney stones    Past Surgical History  Procedure Date  . Reapea urethral surgery for recurrent obstruction 2011  . Total knee arthroplasty 2007    reports that he quit smoking about 21 months ago. His smoking use included Cigarettes. He has a 64 pack-year smoking history. He does not have any smokeless tobacco history on file. He reports that he does not drink alcohol or use illicit drugs. family history includes Alcohol abuse in his other; Diabetes in his other; Heart disease in his other; and Stroke in his other. Allergies  Allergen Reactions  . Morphine Sulfate     REACTION: Itches all over      Review of Systems  Constitutional:  Negative for fever, chills, appetite change and unexpected weight change.  HENT: Negative for trouble swallowing.   Respiratory: Negative for cough and shortness of breath.   Cardiovascular: Negative for chest pain, palpitations and leg swelling.  Gastrointestinal: Negative for abdominal pain.  Genitourinary: Positive for frequency. Negative for dysuria.  Neurological: Negative for dizziness and syncope.  Psychiatric/Behavioral: Negative for dysphoric mood.       Objective:   Physical Exam  Constitutional: He appears well-developed and well-nourished.  HENT:  Mouth/Throat: Oropharynx is clear and moist.  Neck: Neck supple. No thyromegaly present.  Cardiovascular: Normal rate and regular rhythm.   Pulmonary/Chest: Effort normal and breath sounds normal. No respiratory distress. He has no wheezes. He has no rales.  Musculoskeletal: He exhibits no edema.  Skin:       Feet reveal no skin lesions. Good distal foot pulses. Good capillary refill. No calluses. Normal sensation with monofilament testing           Assessment & Plan:  #1 type 2 diabetes. History of poor control. Poor compliance. He relates this to difficulty getting Lantus insulin through insurance. He gave samples today and re- submitted his prescription. We have not received any prior authorization forms. We did not see any point of getting A1c today since we know this will be extremely poorly controlled since he's been off Lantus since last visit with fasting blood sugars over 300. We have again reviewed titration regimen for Lantus once he gets back on this #2 hypertension. Well controlled. At goal. Continue current medications  #3  history of dyslipidemia. Recheck lipids at follow up #4 peripheral neuropathy symptoms well-controlled with gabapentin

## 2012-03-30 ENCOUNTER — Ambulatory Visit (INDEPENDENT_AMBULATORY_CARE_PROVIDER_SITE_OTHER): Payer: BC Managed Care – PPO | Admitting: Family Medicine

## 2012-03-30 VITALS — BP 110/70 | Temp 98.6°F | Wt 216.0 lb

## 2012-03-30 DIAGNOSIS — R252 Cramp and spasm: Secondary | ICD-10-CM

## 2012-03-30 DIAGNOSIS — G5732 Lesion of lateral popliteal nerve, left lower limb: Secondary | ICD-10-CM

## 2012-03-30 DIAGNOSIS — E119 Type 2 diabetes mellitus without complications: Secondary | ICD-10-CM

## 2012-03-30 DIAGNOSIS — G5730 Lesion of lateral popliteal nerve, unspecified lower limb: Secondary | ICD-10-CM

## 2012-03-30 LAB — MAGNESIUM: Magnesium: 2.1 mg/dL (ref 1.5–2.5)

## 2012-03-30 LAB — HEMOGLOBIN A1C: Hgb A1c MFr Bld: 15.5 % — ABNORMAL HIGH (ref 4.6–6.5)

## 2012-03-30 NOTE — Patient Instructions (Addendum)
Leg Cramps Leg cramps that occur during exercise can be caused by poor circulation or dehydration. However, muscle cramps that occur at rest or during the night are usually not due to any serious medical problem. Heat cramps may cause muscle spasms during hot weather.  CAUSES There is no clear cause for muscle cramps. However, dehydration may be a factor for those who do not drink enough fluids and those who exercise in the heat. Imbalances in the level of sodium, potassium, calcium or magnesium in the muscle tissue may also be a factor. Some medications, such as water pills (diuretics), may cause loss of chemicals that the body needs (like sodium and potassium) and cause muscle cramps. TREATMENT   Make sure your diet has enough fluids and essential minerals for the muscle to work normally.  Avoid strenuous exercise for several days if you have been having frequent leg cramps.  Stretch and massage the cramped muscle for several minutes.  Some medicines may be helpful in some patients with night cramps. Only take over-the-counter or prescription medicines as directed by your caregiver. SEEK IMMEDIATE MEDICAL CARE IF:   Your leg cramps become worse.  Your foot becomes cold, numb, or blue. Document Released: 02/04/2004 Document Revised: 03/21/2011 Document Reviewed: 01/22/2008 [facility] Patient Information 2013 [place].

## 2012-03-30 NOTE — Progress Notes (Signed)
Subjective:    Patient ID: Anthony Bullock, male    DOB: 24-Apr-1960, 52 y.o.   MRN: [id number]  HPI Patient seen for acute visit. He has type 2 diabetes poorly controlled, hyperlipidemia, stage III chronic kidney disease, diabetic retinopathy, history of urethral stenosis, and history of kidney stones.  Seen today for the following acute issues:  Bilateral leg cramps for the past several nights. Been taking over-the-counter potassium and magnesium. Generally drinking plenty of fluids. No claudication symptoms.  Type 2 diabetes which has been poorly controlled. Very poor dietary compliance. Generally drinks about 100 ounces of [place] per day. Has been to nutritionist.  Currently on Lantus 80 units daily and Humalog and varies between 12 and 25 units per meal. No recent hypoglycemia.  This past Wednesday 2 days ago he had a meeting for approximately one hour left lower extremity bent up underneath the right. Following the meeting he had difficulty with walking with left foot drop. He has had some difficulty with ambulation since then. Persistent weakness. No significant numbness. No low back pain.  Past Medical History  Diagnosis Date  . Allergic rhinitis   . Diabetes mellitus type II   . Hyperlipidemia   . Hypertension   . Urethral stricture   . Kidney stones    Past Surgical History  Procedure Laterality Date  . Reapea urethral surgery for recurrent obstruction  2011  . Total knee arthroplasty  2007    reports that he quit smoking about 22 months ago. His smoking use included Cigarettes. He has a 64 pack-year smoking history. He does not have any smokeless tobacco history on file. He reports that he does not drink alcohol or use illicit drugs. family history includes Alcohol abuse in his other; Diabetes in his other; Heart disease in his other; and Stroke in his other. Allergies  Allergen Reactions  . Morphine Sulfate     REACTION: Itches all over      Review of Systems   Constitutional: Negative for appetite change and unexpected weight change.  Respiratory: Negative for shortness of breath.   Cardiovascular: Negative for chest pain.  Endocrine: Negative for polyuria.  Genitourinary: Negative for dysuria.  Neurological: Positive for weakness.  Hematological: Negative for adenopathy.       Objective:   Physical Exam  Constitutional: He appears well-developed and well-nourished. No distress.  Neck: Neck supple.  Cardiovascular: Normal rate and regular rhythm.   Pulmonary/Chest: Effort normal and breath sounds normal. No respiratory distress. He has no wheezes. He has no rales.  Musculoskeletal: He exhibits no edema.  Both feet are warm to touch. 2+ dorsalis pedis pulses bilaterally. Good capillary refill bilaterally  Neurological:  Patient has severe foot drop left lower extremity. He has full strength with plantar and flexion dorsiflexion right. He has full strength with knee extension bilaterally.          Assessment & Plan:  #1 left peroneal palsy. Hopefully this is related to recent prolonged position change/compression on peroneal nerve. If no improvement by early next week consider referral for AFO brace and possible neurology referral. Avoid any pressure on the left knee-such as compression wraps. He has elastic sleeve in knee on today's visit but this is fairly loose and doubt etiology of current palsy.  No low back pain. #2 type 2 diabetes poor control. Recheck A1c. Consider endocrinology referral. He is on fairly high levels of insulin but very poor diet.  Continues to consume about 5 20 ounce [employer] per  day. #3 bilateral leg cramps. Check basic metabolic panel and magnesium level. He is cautioned about magnesium/potassium supplementation b/o his chronic kidney disease.

## 2012-03-31 ENCOUNTER — Telehealth: Payer: Self-pay

## 2012-03-31 NOTE — Telephone Encounter (Signed)
[facility] Triage Call Report Triage Record Num: [id number] Operator: Flonnie Hailstone Patient Name: Anthony Bullock Call Date & Time: 03/30/2012 7:22:26PM Patient Phone: [phone] PCP: Carolann Littler Patient Gender: Male PCP Fax : [phone] Patient DOB: 04/08/1960 Practice Name: [facility] Reason for Call: Caller: Esperanza Richters; PCP: Carolann Littler (Family Practice); CB#: [phone]; [facility] calling and states patient had BMP drawn 3-21 and glucose is 717. Patient notified. Denies nausea/vomiting. No abdominal pain. States blood sugar has been more than 700 "numerous times and this will not be the last". Patient rechecked glucose using home meter and is 600. Has not taken Humalog that takes prior to ecvening meal. Advised to follow usual meal and medicine plan. Advised ED if develops any nausea/vomiting/confusion. Protocol(s) Used: Diabetes: Control Problems Recommended Outcome per Protocol: See Provider within 4 hours Reason for Outcome: New or increasing symptoms OR glucose not within provider defined guidelines AND not taking medications/following treatment plan Care Advice: ~ 03/

## 2012-04-02 LAB — BASIC METABOLIC PANEL
BUN: 22 mg/dL (ref 6–23)
CO2: 26 mEq/L (ref 19–32)
Calcium: 8.4 mg/dL (ref 8.4–10.5)
Chloride: 94 mEq/L — ABNORMAL LOW (ref 96–112)
Creatinine, Ser: 2.4 mg/dL — ABNORMAL HIGH (ref 0.4–1.5)
GFR: 31.14 mL/min — ABNORMAL LOW (ref >60.00)
Glucose, Bld: 721 mg/dL (ref 70–99)
Potassium: 4.5 mEq/L (ref 3.5–5.1)
Sodium: 125 mEq/L — ABNORMAL LOW (ref 135–145)

## 2012-04-02 NOTE — Telephone Encounter (Signed)
I spoke with patient this morning Blood sugars are greatly improved at this time. Yesterday had fasting blood sugar 218 and around 205 today. We have recommended he increase his Lantus to 85 units once daily and continue meal time insulin We have strongly advocated that he kind of his dietary control We are setting him up for AFO brace for left foot drop. He is recovering some strength very slowly and hopefully this will continue to recover with time

## 2012-04-04 NOTE — Progress Notes (Signed)
Quick Note:  Pt informed and he will call back to schedule 2 week ROV ______

## 2012-05-04 ENCOUNTER — Encounter: Payer: Self-pay | Admitting: Family Medicine

## 2012-05-04 ENCOUNTER — Ambulatory Visit (INDEPENDENT_AMBULATORY_CARE_PROVIDER_SITE_OTHER): Payer: BC Managed Care – PPO | Admitting: Family Medicine

## 2012-05-04 VITALS — BP 120/62 | Temp 97.8°F | Wt 217.0 lb

## 2012-05-04 DIAGNOSIS — R252 Cramp and spasm: Secondary | ICD-10-CM

## 2012-05-04 DIAGNOSIS — N183 Chronic kidney disease, stage 3 unspecified: Secondary | ICD-10-CM

## 2012-05-04 DIAGNOSIS — E119 Type 2 diabetes mellitus without complications: Secondary | ICD-10-CM

## 2012-05-04 DIAGNOSIS — E871 Hypo-osmolality and hyponatremia: Secondary | ICD-10-CM

## 2012-05-04 LAB — BASIC METABOLIC PANEL
BUN: 22 mg/dL (ref 6–23)
CO2: 25 mEq/L (ref 19–32)
Calcium: 8.7 mg/dL (ref 8.4–10.5)
Chloride: 105 mEq/L (ref 96–112)
Creatinine, Ser: 1.8 mg/dL — ABNORMAL HIGH (ref 0.4–1.5)
GFR: 42.55 mL/min — ABNORMAL LOW (ref >60.00)
Glucose, Bld: 153 mg/dL — ABNORMAL HIGH (ref 70–99)
Potassium: 4.4 mEq/L (ref 3.5–5.1)
Sodium: 136 mEq/L (ref 135–145)

## 2012-05-04 NOTE — Progress Notes (Signed)
  Subjective:    Patient ID: Anthony Bullock, male    DOB: [birth year], 52 y.o.   MRN: [id number]  HPI Patient seen for medical followup. Recent left perineal nerve palsy. Possibly positional. We set him up for AFO brace. Symptoms are slowly improving but he still has substantial weakness. Ambulating much better.  Type 2 diabetes with very poor control and very poor compliance. Recent A1c 15%. Has made some dietary changes. Currently takes Lantus 85 units once daily and Humalog 20 units 3 times daily No recent hypoglycemia reported. Still has frequent leg cramps. Recent lab significant for hyponatremia which is likely related to his hyperglycemia. Creatinine 2.4. Question volume completion from recent poor control diabetes. He's been taking samples of Onglyza 5 mg daily. Fasting blood sugar this morning 154 and ranging generally between 150 and 300.  Patient complaining of frequent nocturnal leg raise. Generally drinking plenty of fluids. Recent potassium normal. Recent magnesium normal  Past Medical History  Diagnosis Date  . Allergic rhinitis   . Diabetes mellitus type II   . Hyperlipidemia   . Hypertension   . Urethral stricture   . Kidney stones    Past Surgical History  Procedure Laterality Date  . Reapea urethral surgery for recurrent obstruction  2011  . Total knee arthroplasty  2007    reports that he quit smoking about 1 years ago. His smoking use included Cigarettes. He has a 64 pack-year smoking history. He does not have any smokeless tobacco history on file. He reports that he does not drink alcohol or use illicit drugs. family history includes Alcohol abuse in his other; Diabetes in his other; Heart disease in his other; and Stroke in his other. Allergies  Allergen Reactions  . Morphine Sulfate     REACTION: Itches all over     Review of Systems  Constitutional: Negative for fatigue.  Eyes: Negative for visual disturbance.  Respiratory: Negative for cough, chest  tightness and shortness of breath.   Cardiovascular: Negative for chest pain, palpitations and leg swelling.  Endocrine: Negative for polydipsia, polyphagia and polyuria.  Neurological: Negative for dizziness, syncope, weakness, light-headedness and headaches.       Objective:   Physical Exam  Constitutional: He appears well-developed and well-nourished. No distress.  Cardiovascular: Normal rate and regular rhythm.   Pulmonary/Chest: Effort normal and breath sounds normal. No respiratory distress. He has no wheezes. He has no rales.  Musculoskeletal: He exhibits no edema.          Assessment & Plan:  #1 type 2 diabetes. Poorly controlled. Recheck A1c in a couple months. Provided further samples of Onglyza. #2 recent left perineal nerve palsy. Discussed possible urology referral at this point though there would be no: other specific treatment. He knows to avoid compressive wraps left knee. Continue AFO brace #3 hyponatremia. Likely factitious related to recent hyperglycemia. Recheck basic metabolic panel #4 chronic kidney disease. Probably recently exacerbated by poorly controlled diabetes. Recheck basic metabolic panel today #5 leg cramps. Try multivitamin with B complex. Emphasized plenty of fluids. May have component of restless leg syndrome. Consider dopamine agonist if symptoms continue

## 2012-05-04 NOTE — Patient Instructions (Signed)
Try over the counter multivitamin to see if this helps with leg cramps.

## 2012-05-08 NOTE — Progress Notes (Signed)
Quick Note:  Pt informed on personally identified VM ______ 

## 2012-05-23 ENCOUNTER — Other Ambulatory Visit: Payer: Self-pay | Admitting: Family Medicine

## 2012-06-01 LAB — HM DIABETES EYE EXAM

## 2012-06-08 ENCOUNTER — Encounter: Payer: Self-pay | Admitting: Family Medicine

## 2012-10-29 ENCOUNTER — Ambulatory Visit (INDEPENDENT_AMBULATORY_CARE_PROVIDER_SITE_OTHER): Payer: BC Managed Care – PPO | Admitting: Family Medicine

## 2012-10-29 ENCOUNTER — Encounter: Payer: Self-pay | Admitting: Family Medicine

## 2012-10-29 VITALS — BP 122/78 | HR 107 | Temp 97.9°F | Wt 211.0 lb

## 2012-10-29 DIAGNOSIS — I10 Essential (primary) hypertension: Secondary | ICD-10-CM

## 2012-10-29 DIAGNOSIS — N183 Chronic kidney disease, stage 3 unspecified: Secondary | ICD-10-CM

## 2012-10-29 DIAGNOSIS — E785 Hyperlipidemia, unspecified: Secondary | ICD-10-CM

## 2012-10-29 DIAGNOSIS — E119 Type 2 diabetes mellitus without complications: Secondary | ICD-10-CM

## 2012-10-29 LAB — HM DIABETES FOOT EXAM: HM Diabetic Foot Exam: NORMAL

## 2012-10-29 MED ORDER — INSULIN LISPRO 100 UNIT/ML ~~LOC~~ SOLN: 10.0000 [IU] | Freq: Two times a day (BID) | SUBCUTANEOUS | Status: DC

## 2012-10-29 MED ORDER — SAXAGLIPTIN HCL 5 MG PO TABS: 5.0000 mg | ORAL_TABLET | Freq: Every day | ORAL | Status: DC

## 2012-10-29 NOTE — Progress Notes (Signed)
  Subjective:    Patient ID: Anthony Bullock, male    DOB: 06/30/1960, 52 y.o.   MRN: [id number]  HPI Patient here for followup regarding multiple medical problems  He has history of very poor compliance but recently gave up [facility] Dew-was drinking about 6 per day. He had blood sugars that were ranging upper in the 600 range. Last A1c 15.5. We had already sent him to diabetes educator but he did refuse to make dietary changes until now.  Currently takes Lantus 35 units once daily and Humalog 14 units twice daily with meals. Not checking blood sugars consistently. Fasting blood sugars range from 95 to occasionally over 200. Overall they're greatly improved since his make dietary changes. He's not having any blurred vision. He has some ongoing issues with peripheral neuropathy. No hypoglycemia  Past Medical History  Diagnosis Date  . Allergic rhinitis   . Diabetes mellitus type II   . Hyperlipidemia   . Hypertension   . Urethral stricture   . Kidney stones    Past Surgical History  Procedure Laterality Date  . Reapea urethral surgery for recurrent obstruction  2011  . Total knee arthroplasty  2007    reports that he quit smoking about 2 years ago. His smoking use included Cigarettes. He has a 64 pack-year smoking history. He does not have any smokeless tobacco history on file. He reports that he does not drink alcohol or use illicit drugs. family history includes Alcohol abuse in his other; Diabetes in his other; Heart disease in his other; Stroke in his other. Allergies  Allergen Reactions  . Morphine Sulfate     REACTION: Itches all over      Review of Systems  Constitutional: Negative for appetite change, fatigue and unexpected weight change.  Eyes: Negative for visual disturbance.  Respiratory: Negative for cough, chest tightness and shortness of breath.   Cardiovascular: Negative for chest pain, palpitations and leg swelling.  Gastrointestinal: Negative for nausea and  vomiting.  Endocrine: Negative for polydipsia and polyuria.  Neurological: Negative for dizziness, syncope, weakness, light-headedness and headaches.       Objective:   Physical Exam  Constitutional: He appears well-developed and well-nourished.  HENT:  Mouth/Throat: Oropharynx is clear and moist.  Neck: Neck supple. No thyromegaly present.  Cardiovascular: Normal rate and regular rhythm.   Pulmonary/Chest: Effort normal and breath sounds normal. No respiratory distress. He has no wheezes. He has no rales.  Musculoskeletal: He exhibits no edema.  Skin:  Feet reveal no skin lesions. Good distal foot pulses. Good capillary refill. No calluses. Normal sensation with monofilament testing           Assessment & Plan:  #1 type 2 diabetes with very poor control. He has made some positive lifestyle changes. Titration regimen given for Lantus. Add Onglyza 5 mg once daily. Future labs with A1c in one month #2 hypertension. Well controlled #3 dyslipidemia. Repeat lipids fasting at followup in one month

## 2012-10-29 NOTE — Patient Instructions (Signed)
Start Onglyza 5 mg once dailuy Titrate up Lantus 2 units every 3 days until fasting glucose consistently < 130

## 2012-11-30 ENCOUNTER — Encounter: Payer: Self-pay | Admitting: Family Medicine

## 2012-11-30 ENCOUNTER — Ambulatory Visit (INDEPENDENT_AMBULATORY_CARE_PROVIDER_SITE_OTHER): Payer: BC Managed Care – PPO | Admitting: Family Medicine

## 2012-11-30 VITALS — BP 126/72 | HR 104 | Temp 97.8°F | Wt 207.0 lb

## 2012-11-30 DIAGNOSIS — I10 Essential (primary) hypertension: Secondary | ICD-10-CM

## 2012-11-30 DIAGNOSIS — N183 Chronic kidney disease, stage 3 unspecified: Secondary | ICD-10-CM

## 2012-11-30 DIAGNOSIS — E119 Type 2 diabetes mellitus without complications: Secondary | ICD-10-CM

## 2012-11-30 DIAGNOSIS — R0600 Dyspnea, unspecified: Secondary | ICD-10-CM

## 2012-11-30 DIAGNOSIS — R0609 Other forms of dyspnea: Secondary | ICD-10-CM

## 2012-11-30 DIAGNOSIS — E785 Hyperlipidemia, unspecified: Secondary | ICD-10-CM

## 2012-11-30 LAB — BASIC METABOLIC PANEL
BUN: 21 mg/dL (ref 6–23)
CO2: 28 mEq/L (ref 19–32)
Calcium: 9 mg/dL (ref 8.4–10.5)
Chloride: 105 mEq/L (ref 96–112)
Creatinine, Ser: 1.7 mg/dL — ABNORMAL HIGH (ref 0.4–1.5)
GFR: 45.98 mL/min — ABNORMAL LOW (ref >60.00)
Glucose, Bld: 215 mg/dL — ABNORMAL HIGH (ref 70–99)
Potassium: 4.8 mEq/L (ref 3.5–5.1)
Sodium: 135 mEq/L (ref 135–145)

## 2012-11-30 LAB — HEMOGLOBIN A1C: Hgb A1c MFr Bld: 8.6 % — ABNORMAL HIGH (ref 4.6–6.5)

## 2012-11-30 LAB — LIPID PANEL
Cholesterol: 165 mg/dL (ref 0–200)
HDL: 45.7 mg/dL (ref >39.00)
LDL Cholesterol: 104 mg/dL — ABNORMAL HIGH (ref 0–99)
Total CHOL/HDL Ratio: 4
Triglycerides: 77 mg/dL (ref 0.0–149.0)
VLDL: 15.4 mg/dL (ref 0.0–40.0)

## 2012-11-30 LAB — HEPATIC FUNCTION PANEL
ALT: 19 U/L (ref 0–53)
AST: 18 U/L (ref 0–37)
Albumin: 3.1 g/dL — ABNORMAL LOW (ref 3.5–5.2)
Alkaline Phosphatase: 67 U/L (ref 39–117)
Bilirubin, Direct: 0.1 mg/dL (ref 0.0–0.3)
Total Bilirubin: 0.7 mg/dL (ref 0.3–1.2)
Total Protein: 6.6 g/dL (ref 6.0–8.3)

## 2012-11-30 LAB — BRAIN NATRIURETIC PEPTIDE: Pro B Natriuretic peptide (BNP): 544 pg/mL — ABNORMAL HIGH (ref 0.0–100.0)

## 2012-11-30 LAB — SEDIMENTATION RATE: Sed Rate: 18 mm/hr (ref 0–22)

## 2012-11-30 NOTE — Patient Instructions (Signed)
Continue with the Prilosec We will call you with the echocardiogram appt. Continue Lantus titration up 2 units every 2 days until fasting sugars < 130.

## 2012-11-30 NOTE — Progress Notes (Signed)
Pre visit review using our clinic review tool, if applicable. No additional management support is needed unless otherwise documented below in the visit note. 

## 2012-11-30 NOTE — Progress Notes (Signed)
Subjective:    Patient ID: Anthony Bullock, male    DOB: [birth year], 52 y.o.   MRN: [id number]  HPI Patient seen for medical followup  Type 2 diabetes with history of poor control and poor compliance.   He recently started back on Lantus currently taking about 40 units daily. He's not been titrating as we had instructed. We also have added Onglyza 5 mg once daily.  His 30 day blood sugar average has improved from 220 to 168. Overall he feels better with regard to that.  He has new issue of some dyspnea with lying supine. This started about 4 weeks ago. He does not describe any exertional dyspnea or any chest pain. He has been sleeping in recliner. No increased peripheral edema. He initially thought this was related to reflux has been taking Prilosec for one month with minimal if any improvement. He denies any dysphagia. Has some cough when lying supine. He relates that his dyspnea does improve when sitting up. He does have occasional pain with breathing and pain is also improved when leaning forward. He has good appetite.  Hypertension treated with Ramipril and has been stable.  Past Medical History  Diagnosis Date  . Allergic rhinitis   . Diabetes mellitus type II   . Hyperlipidemia   . Hypertension   . Urethral stricture   . Kidney stones    Past Surgical History  Procedure Laterality Date  . Reapea urethral surgery for recurrent obstruction  2011  . Total knee arthroplasty  2007    reports that he quit smoking about 2 years ago. His smoking use included Cigarettes. He has a 64 pack-year smoking history. He does not have any smokeless tobacco history on file. He reports that he does not drink alcohol or use illicit drugs. family history includes Alcohol abuse in his other; Diabetes in his other; Heart disease in his other; Stroke in his other. Allergies  Allergen Reactions  . Morphine Sulfate     REACTION: Itches all over      Review of Systems  Constitutional: Negative for  fever, chills and appetite change.  HENT: Negative for trouble swallowing and voice change.   Respiratory: Positive for cough and shortness of breath. Negative for chest tightness and wheezing.   Cardiovascular: Negative for palpitations and leg swelling.  Gastrointestinal: Negative for abdominal pain.  Genitourinary: Negative for dysuria.  Musculoskeletal: Negative for back pain.  Neurological: Negative for dizziness, syncope and weakness.       Objective:   Physical Exam  Constitutional: He appears well-developed and well-nourished.  HENT:  Mouth/Throat: Oropharynx is clear and moist.  Neck: Neck supple. No thyromegaly present.  Cardiovascular: Normal rate and regular rhythm.  Exam reveals no gallop and no friction rub.   Pulmonary/Chest: Effort normal and breath sounds normal. No respiratory distress. He has no wheezes. He has no rales.  Abdominal: Soft. Bowel sounds are normal. He exhibits no distension and no mass. There is no tenderness. There is no rebound and no guarding.  Musculoskeletal: He exhibits no edema.  Lymphadenopathy:    He has no cervical adenopathy.  Neurological: He is alert.          Assessment & Plan:   #1 dyspnea. He is describing dyspnea and some vague chest pains which occur only positional with supine and relieved with sitting up.   This does not sound as suspicious for CHF as has not had any exertional dyspnea or exertional pain. Rule out paracarditis. Start with EKG. Schedule  echocardiogram. Patient wondered if some of this may of been reflux related but does not sound classic. If the above workup is unrevealing consider referral to GI for further evaluation and possible EGD as his been on Prilosec for one month without much improvement. He is not hypoxic with pulse ox of 98%  #2 type 2 diabetes. History of poor control. Improved with changes above. Repeat A1c today. Continue titration of Lantus as instructed  #3 dyslipidemia. Repeat lipid and hepatic  panel   EKG NSR with no acute changes.

## 2012-12-12 ENCOUNTER — Encounter (INDEPENDENT_AMBULATORY_CARE_PROVIDER_SITE_OTHER): Payer: Self-pay

## 2012-12-12 ENCOUNTER — Ambulatory Visit (HOSPITAL_COMMUNITY): Payer: BC Managed Care – PPO | Attending: Family Medicine | Admitting: Radiology

## 2012-12-12 DIAGNOSIS — R0600 Dyspnea, unspecified: Secondary | ICD-10-CM

## 2012-12-12 DIAGNOSIS — E785 Hyperlipidemia, unspecified: Secondary | ICD-10-CM | POA: Insufficient documentation

## 2012-12-12 DIAGNOSIS — I059 Rheumatic mitral valve disease, unspecified: Secondary | ICD-10-CM | POA: Insufficient documentation

## 2012-12-12 DIAGNOSIS — E119 Type 2 diabetes mellitus without complications: Secondary | ICD-10-CM | POA: Insufficient documentation

## 2012-12-12 DIAGNOSIS — I079 Rheumatic tricuspid valve disease, unspecified: Secondary | ICD-10-CM | POA: Insufficient documentation

## 2012-12-12 DIAGNOSIS — N183 Chronic kidney disease, stage 3 unspecified: Secondary | ICD-10-CM

## 2012-12-12 DIAGNOSIS — R0602 Shortness of breath: Secondary | ICD-10-CM | POA: Insufficient documentation

## 2012-12-12 DIAGNOSIS — N189 Chronic kidney disease, unspecified: Secondary | ICD-10-CM | POA: Insufficient documentation

## 2012-12-12 DIAGNOSIS — I129 Hypertensive chronic kidney disease with stage 1 through stage 4 chronic kidney disease, or unspecified chronic kidney disease: Secondary | ICD-10-CM | POA: Insufficient documentation

## 2012-12-12 NOTE — Progress Notes (Signed)
Echocardiogram performed.  

## 2012-12-14 ENCOUNTER — Telehealth: Payer: Self-pay | Admitting: Family Medicine

## 2012-12-14 NOTE — Telephone Encounter (Signed)
Informed pt that Dr. Elease Hashimoto is out of the office and i will let him know his results when Dr. Elease Hashimoto lets me know.

## 2012-12-14 NOTE — Telephone Encounter (Addendum)
Pt would like results of echocardiogram results done 12/3. pls advise.

## 2012-12-18 ENCOUNTER — Telehealth: Payer: Self-pay | Admitting: Family Medicine

## 2012-12-18 DIAGNOSIS — I5020 Unspecified systolic (congestive) heart failure: Secondary | ICD-10-CM

## 2012-12-18 NOTE — Telephone Encounter (Signed)
Pt needs results of echocardiogram

## 2012-12-18 NOTE — Telephone Encounter (Signed)
Pt notified.  Echo showed EF 25-30 % with hypokinesis of anteroseptal myocardium.  Will set up cardiology referral.  Pt notified and agrees.  No progression of symptoms since last visit.

## 2012-12-25 ENCOUNTER — Ambulatory Visit (INDEPENDENT_AMBULATORY_CARE_PROVIDER_SITE_OTHER): Payer: BC Managed Care – PPO | Admitting: Cardiology

## 2012-12-25 ENCOUNTER — Encounter (INDEPENDENT_AMBULATORY_CARE_PROVIDER_SITE_OTHER): Payer: Self-pay

## 2012-12-25 ENCOUNTER — Encounter: Payer: Self-pay | Admitting: Cardiology

## 2012-12-25 VITALS — BP 110/76 | HR 113 | Ht 71.0 in | Wt 220.0 lb

## 2012-12-25 DIAGNOSIS — J449 Chronic obstructive pulmonary disease, unspecified: Secondary | ICD-10-CM

## 2012-12-25 DIAGNOSIS — E785 Hyperlipidemia, unspecified: Secondary | ICD-10-CM

## 2012-12-25 DIAGNOSIS — E119 Type 2 diabetes mellitus without complications: Secondary | ICD-10-CM

## 2012-12-25 DIAGNOSIS — I10 Essential (primary) hypertension: Secondary | ICD-10-CM

## 2012-12-25 DIAGNOSIS — I5021 Acute systolic (congestive) heart failure: Secondary | ICD-10-CM

## 2012-12-25 DIAGNOSIS — I509 Heart failure, unspecified: Secondary | ICD-10-CM

## 2012-12-25 MED ORDER — FUROSEMIDE 40 MG PO TABS: 40.0000 mg | ORAL_TABLET | Freq: Every day | ORAL | Status: DC

## 2012-12-25 NOTE — Progress Notes (Signed)
Patient ID: DESMOND MCEWAN, male   DOB: Feb 08, 1960, 52 y.o.   MRN: [id number]     Patient Name: Anthony Bullock Date of Encounter: 12/25/2012  Primary Care Provider:  [name], MD Primary Cardiologist:  Ena Dawley, H  Problem List   Past Medical History  Diagnosis Date  . Allergic rhinitis   . Diabetes mellitus type II   . Hyperlipidemia   . Hypertension   . Urethral stricture   . Kidney stones    Past Surgical History  Procedure Laterality Date  . Reapea urethral surgery for recurrent obstruction  2011  . Total knee arthroplasty  2007   Allergies  Allergies  Allergen Reactions  . Morphine Sulfate     REACTION: Itches all over   HPI  52 year old male with h/o hypertension, IDDM, hyperlipidemia, CKD stage 3, who is coming with concern of exertional shortness of breath. The partient states that he feels SOB with minimal activities. He also noticed lower extremity swelling in the last couple of weeks. The patient experiences orthopnea and has been sleeping in recliner in the last couple of weeks. He denies chest pain, palpitations or syncope.   The patient underwent a TTE that showed severely dilated left ventricle with severely decreased systolic function and LVEF 25-30%.  There are regional wall motion abnormalities. MOderate MR and TR.  Home Medications  Prior to Admission medications   Medication Sig Start Date End Date Taking? Authorizing Provider  aspirin 81 MG tablet Take 81 mg by mouth daily.     Yes Historical Provider, MD  fenofibrate 160 MG tablet TAKE 1 TABLET DAILY 05/23/12  Yes [name], MD  gabapentin (NEURONTIN) 300 MG capsule Take 1 capsule (300 mg total) by mouth 3 (three) times daily. 11/10/11  Yes [name], MD  Glucose Blood (ACCU-CHEK EASY TEST VI) by In Vitro route QID.   Yes Historical Provider, MD  glucose blood (ACCU-CHEK INSTANT PLUS TEST) test strip Use as instructed daily 05/02/11 12/25/12 Yes [name], MD    insulin glargine (LANTUS) 100 UNIT/ML injection 50 Units daily. Use as directed daily 02/10/12  Yes [name], MD  insulin lispro (HUMALOG) 100 UNIT/ML injection Inject 14 Units into the skin 3 (three) times daily with meals. 10/29/12 10/29/13 Yes [name], MD  Insulin Pen Needle 32G X 4 MM MISC Use three times daily with insulin 11/10/11  Yes [name], MD  ramipril (ALTACE) 10 MG capsule TAKE 1 CAPSULE DAILY 05/23/12  Yes [name], MD  saxagliptin HCl (ONGLYZA) 5 MG TABS tablet Take 1 tablet (5 mg total) by mouth daily. 10/29/12  Yes [name], MD  sildenafil (VIAGRA) 100 MG tablet Take 50-100 mg by mouth daily as needed. 09/08/11 12/26/14 Yes [name], MD  simvastatin (ZOCOR) 80 MG tablet TAKE 1 TABLET AT BEDTIME 05/23/12  Yes [name], MD    Family History  Family History  Problem Relation Age of Onset  . Alcohol abuse Other   . Heart disease Other   . Diabetes Other   . Stroke Other     Social History  History   Social History  . Marital Status: Married    Spouse Name: N/A    Number of Children: N/A  . Years of Education: N/A   Occupational History  . Not on file.   Social History Main Topics  . Smoking status: Former Smoker -- 2.00 packs/day for 32 years  Types: Cigarettes    Quit date: 05/11/2010  . Smokeless tobacco: Not on file  . Alcohol Use: No  . Drug Use: No  . Sexual Activity: Not on file   Other Topics Concern  . Not on file   Social History Narrative  . No narrative on file     Review of Systems, as per HPI, otherwise negative General:  No chills, fever, night sweats or weight changes.  Cardiovascular:  No chest pain, dyspnea on exertion, edema, orthopnea, palpitations, paroxysmal nocturnal dyspnea. Dermatological: No rash, lesions/masses Respiratory: No cough, dyspnea Urologic: No hematuria, dysuria Abdominal:   No nausea, vomiting, diarrhea, bright red blood per rectum, melena, or  hematemesis Neurologic:  No visual changes, wkns, changes in mental status. All other systems reviewed and are otherwise negative except as noted above.  Physical Exam  Blood pressure 110/76, pulse 113, height 5\' 11"  (1.803 m), weight 220 lb (99.791 kg), SpO2 97.00%.  General: Pleasant, NAD Psych: Normal affect. Neuro: Alert and oriented X 3. Moves all extremities spontaneously. HEENT: Normal  Neck: Supple without bruits, JVD + 4 cm B/L. Lungs:  Resp regular and unlabored, CTA. Heart: RRR no s3, s4, holosystolic murmur best heart at the apex. Abdomen: Soft, non-tender, non-distended, BS + x 4.  Extremities: No clubbing, cyanosis, edema up to the knees. DP/PT/Radials 2+ and equal bilaterally.  Labs:  No results found for this basename: CKTOTAL, CKMB, TROPONINI,  in the last 72 hours Lab Results  Component Value Date   WBC 9.2 04/13/2010   HGB 13.0 04/13/2010   HCT 38.0* 04/13/2010   MCV 85.1 04/13/2010   PLT 211.0 04/13/2010   No results found for this basename: NA, K, CL, CO2, BUN, CREATININE, CALCIUM, LABALBU, PROT, BILITOT, ALKPHOS, ALT, AST, GLUCOSE,  in the last 168 hours Lab Results  Component Value Date   CHOL 165 11/30/2012   HDL 45.70 11/30/2012   LDLCALC 104* 11/30/2012   TRIG 77.0 11/30/2012   BNP 544  Accessory Clinical Findings  ECG - SR, LAE, non-specific ST-T wave abnormalities  Echocardiogram: 12/12/2012  Left ventricle: The cavity size was severely dilated. Wall thickness was increased in a pattern of mild LVH. Systolic function was severely reduced. The estimated ejection fraction was in the range of 25% to 30%. Regional wall motion abnormalities: There is moderate hypokinesis of the entireanteroseptal myocardium. Doppler parameters are consistent with abnormal left ventricular relaxation (grade 1 diastolic dysfunction). ------------------------------------------------------------ Aortic valve: Structurally normal valve. Trileaflet. Cusp separation was  normal. Doppler: Transvalvular velocity was within the normal range. There was no stenosis. No regurgitation. Mitral valve: Mildly thickened leaflets . Doppler: Moderate regurgitation. ------------------------------------------------------------ Left atrium: The atrium was moderately dilated. ------------------------------------------------------------ Right ventricle: The cavity size was normal. Wall thickness was normal. Systolic function was normal. ------------------------------------------------------------ Pulmonic valve: Structurally normal valve. Cusp separation was normal. Doppler: Transvalvular velocity was within the normal range. No regurgitation. ------------------------------------------------------------ Tricuspid valve: Doppler: Moderate regurgitation. ------------------------------------------------------------ Right atrium: The atrium was normal in size. ------------------------------------------------------------ Pericardium: The pericardium was normal in appearance.    Assessment & Plan   A 52 year old male   1. New diagnosis of acute systolic heart failure - LVEF 25-30%, regional wall motion abnormalities. The patient is a high risk for CAD, including 64-pack year h/o smoking, HTN, HLP, IDDM. He should undergo cardiac catheterization. However, he is CKD stage 3. We will treat his symptoms for now, start him on oral diuretics - Lasix 40 mg po daily, continue ACEI, aspirin and statin. We will  schedule an exercise nuclear stress test to evaluate for scar/ischemia.  2. Hypertension - controlled  3. IDDM, type 2, recently started on insulin, followed by PCP, HbA1c high 8.6%  4. Hyperlipidemia - HDL and TRIG at goal, LDL 104, goal <70, on zocor 80mg  daily, we will switch to atorvastatin 40 mg po daily at the next visit  5. COPD - 62 pack year h/o smoking, smoking cessation counseling provided  Follow up in 2 weeks.    Dorothy Spark, MD,  [facility] 12/25/2012, 11:47 AM

## 2012-12-25 NOTE — Patient Instructions (Signed)
Start Lasix 40 mg daily    Schedule exercise myoview follow instructions given    Appointment with Dr.Nelson 01/07/13

## 2012-12-27 DIAGNOSIS — J449 Chronic obstructive pulmonary disease, unspecified: Secondary | ICD-10-CM | POA: Insufficient documentation

## 2012-12-27 DIAGNOSIS — I5021 Acute systolic (congestive) heart failure: Secondary | ICD-10-CM | POA: Insufficient documentation

## 2013-01-07 ENCOUNTER — Encounter (HOSPITAL_COMMUNITY): Payer: BC Managed Care – PPO

## 2013-01-07 ENCOUNTER — Encounter: Payer: Self-pay | Admitting: Cardiology

## 2013-01-07 ENCOUNTER — Ambulatory Visit (INDEPENDENT_AMBULATORY_CARE_PROVIDER_SITE_OTHER): Payer: BC Managed Care – PPO | Admitting: Cardiology

## 2013-01-07 VITALS — BP 126/80 | HR 72 | Ht 71.0 in | Wt 211.8 lb

## 2013-01-07 DIAGNOSIS — I5021 Acute systolic (congestive) heart failure: Secondary | ICD-10-CM

## 2013-01-07 LAB — COMPREHENSIVE METABOLIC PANEL
ALT: 11 U/L (ref 0–53)
AST: 15 U/L (ref 0–37)
Albumin: 3.3 g/dL — ABNORMAL LOW (ref 3.5–5.2)
Alkaline Phosphatase: 48 U/L (ref 39–117)
BUN: 33 mg/dL — ABNORMAL HIGH (ref 6–23)
CO2: 27 mEq/L (ref 19–32)
Calcium: 8.7 mg/dL (ref 8.4–10.5)
Chloride: 105 mEq/L (ref 96–112)
Creatinine, Ser: 2.3 mg/dL — ABNORMAL HIGH (ref 0.4–1.5)
GFR: 31.35 mL/min — ABNORMAL LOW (ref >60.00)
Glucose, Bld: 114 mg/dL — ABNORMAL HIGH (ref 70–99)
Potassium: 4.2 mEq/L (ref 3.5–5.1)
Sodium: 139 mEq/L (ref 135–145)
Total Bilirubin: 0.8 mg/dL (ref 0.3–1.2)
Total Protein: 6.6 g/dL (ref 6.0–8.3)

## 2013-01-07 MED ORDER — ATORVASTATIN CALCIUM 40 MG PO TABS: 40.0000 mg | ORAL_TABLET | Freq: Every day | ORAL | Status: DC

## 2013-01-07 NOTE — Patient Instructions (Signed)
**Note De-Identified  Obfuscation** Your physician has recommended you make the following change in your medication: Stop taking Simvastatin and start taking Atorvastatin 40 mg daily  Your physician recommends that you return for lab work in: today  Your physician recommends that you schedule a follow-up appointment in: Jan. 7, 2015 at 8:30

## 2013-01-07 NOTE — Progress Notes (Signed)
Patient ID: THADEUS LUNDHOLM, male   DOB: May 07, 1960, 52 y.o.   MRN: [id number]      Patient Name: Anthony Bullock Date of Encounter: 01/07/2013  Primary Care Provider:  [name], MD Primary Cardiologist:  Ena Dawley, H  Problem List   Past Medical History  Diagnosis Date  . Allergic rhinitis   . Diabetes mellitus type II   . Hyperlipidemia   . Hypertension   . Urethral stricture   . Kidney stones    Past Surgical History  Procedure Laterality Date  . Reapea urethral surgery for recurrent obstruction  2011  . Total knee arthroplasty  2007   Allergies  Allergies  Allergen Reactions  . Morphine Sulfate     REACTION: Itches all over   HPI  52 year old male with h/o hypertension, IDDM, hyperlipidemia, CKD stage 3, who is coming with concern of exertional shortness of breath. The partient states that he feels SOB with minimal activities. He also noticed lower extremity swelling in the last couple of weeks. The patient experiences orthopnea and has been sleeping in recliner in the last couple of weeks. He denies chest pain, palpitations or syncope.   The patient underwent a TTE that showed severely dilated left ventricle with severely decreased systolic function and LVEF 25-30%.  There are regional wall motion abnormalities. Moderate MR and TR.  This is 2 week follow up, the patient reports significant improvement of lower extremity swelling, however he still has significant DOE with minimal exertion (work in the house). No chest pain. Continues having orthopnea, sleeps in recliner.  Home Medications  Prior to Admission medications   Medication Sig Start Date End Date Taking? Authorizing Provider  aspirin 81 MG tablet Take 81 mg by mouth daily.     Yes Historical Provider, MD  fenofibrate 160 MG tablet TAKE 1 TABLET DAILY 05/23/12  Yes [name], MD  gabapentin (NEURONTIN) 300 MG capsule Take 1 capsule (300 mg total) by mouth 3 (three) times daily. 11/10/11   Yes [name], MD  Glucose Blood (ACCU-CHEK EASY TEST VI) by In Vitro route QID.   Yes Historical Provider, MD  glucose blood (ACCU-CHEK INSTANT PLUS TEST) test strip Use as instructed daily 05/02/11 12/25/12 Yes [name], MD  insulin glargine (LANTUS) 100 UNIT/ML injection 50 Units daily. Use as directed daily 02/10/12  Yes [name], MD  insulin lispro (HUMALOG) 100 UNIT/ML injection Inject 14 Units into the skin 3 (three) times daily with meals. 10/29/12 10/29/13 Yes [name], MD  Insulin Pen Needle 32G X 4 MM MISC Use three times daily with insulin 11/10/11  Yes [name], MD  ramipril (ALTACE) 10 MG capsule TAKE 1 CAPSULE DAILY 05/23/12  Yes [name], MD  saxagliptin HCl (ONGLYZA) 5 MG TABS tablet Take 1 tablet (5 mg total) by mouth daily. 10/29/12  Yes [name], MD  sildenafil (VIAGRA) 100 MG tablet Take 50-100 mg by mouth daily as needed. 09/08/11 12/26/14 Yes [name], MD  simvastatin (ZOCOR) 80 MG tablet TAKE 1 TABLET AT BEDTIME 05/23/12  Yes [name], MD    Family History  Family History  Problem Relation Age of Onset  . Alcohol abuse Other   . Heart disease Other   . Diabetes Other   . Stroke Other     Social History  History   Social History  . Marital Status: Married    Spouse Name: N/A  Number of Children: N/A  . Years of Education: N/A   Occupational History  . Not on file.   Social History Main Topics  . Smoking status: Former Smoker -- 2.00 packs/day for 32 years    Types: Cigarettes    Quit date: 05/11/2010  . Smokeless tobacco: Not on file  . Alcohol Use: No  . Drug Use: No  . Sexual Activity: Not on file   Other Topics Concern  . Not on file   Social History Narrative  . No narrative on file     Review of Systems, as per HPI, otherwise negative General:  No chills, fever, night sweats or weight changes.  Cardiovascular:  No chest pain, dyspnea on exertion, edema,  orthopnea, palpitations, paroxysmal nocturnal dyspnea. Dermatological: No rash, lesions/masses Respiratory: No cough, dyspnea Urologic: No hematuria, dysuria Abdominal:   No nausea, vomiting, diarrhea, bright red blood per rectum, melena, or hematemesis Neurologic:  No visual changes, wkns, changes in mental status. All other systems reviewed and are otherwise negative except as noted above.  Physical Exam  Blood pressure 126/80, pulse 72, height 5\' 11"  (1.803 m), weight 211 lb 12.8 oz (96.072 kg).  General: Pleasant, NAD Psych: Normal affect. Neuro: Alert and oriented X 3. Moves all extremities spontaneously. HEENT: Normal  Neck: Supple without bruits, JVD + 4 cm B/L. Lungs:  Resp regular and unlabored, CTA. Heart: RRR no s3, s4, holosystolic murmur best heart at the apex. Abdomen: Soft, non-tender, non-distended, BS + x 4.  Extremities: No clubbing, cyanosis, trace edema at the ankles. DP/PT/Radials 2+ and equal bilaterally.  Labs:  No results found for this basename: CKTOTAL, CKMB, TROPONINI,  in the last 72 hours Lab Results  Component Value Date   WBC 9.2 04/13/2010   HGB 13.0 04/13/2010   HCT 38.0* 04/13/2010   MCV 85.1 04/13/2010   PLT 211.0 04/13/2010   No results found for this basename: NA, K, CL, CO2, BUN, CREATININE, CALCIUM, LABALBU, PROT, BILITOT, ALKPHOS, ALT, AST, GLUCOSE,  in the last 168 hours Lab Results  Component Value Date   CHOL 165 11/30/2012   HDL 45.70 11/30/2012   LDLCALC 104* 11/30/2012   TRIG 77.0 11/30/2012   BNP 544  Accessory Clinical Findings  ECG - SR, LAE, non-specific ST-T wave abnormalities  Echocardiogram: 12/12/2012  Left ventricle: The cavity size was severely dilated. Wall thickness was increased in a pattern of mild LVH. Systolic function was severely reduced. The estimated ejection fraction was in the range of 25% to 30%. Regional wall motion abnormalities: There is moderate hypokinesis of the entireanteroseptal myocardium. Doppler  parameters are consistent with abnormal left ventricular relaxation (grade 1 diastolic dysfunction). ------------------------------------------------------------ Aortic valve: Structurally normal valve. Trileaflet. Cusp separation was normal. Doppler: Transvalvular velocity was within the normal range. There was no stenosis. No regurgitation. Mitral valve: Mildly thickened leaflets . Doppler: Moderate regurgitation. ------------------------------------------------------------ Left atrium: The atrium was moderately dilated. ------------------------------------------------------------ Right ventricle: The cavity size was normal. Wall thickness was normal. Systolic function was normal. ------------------------------------------------------------ Pulmonic valve: Structurally normal valve. Cusp separation was normal. Doppler: Transvalvular velocity was within the normal range. No regurgitation. ------------------------------------------------------------ Tricuspid valve: Doppler: Moderate regurgitation. ------------------------------------------------------------ Right atrium: The atrium was normal in size. ------------------------------------------------------------ Pericardium: The pericardium was normal in appearance.    Assessment & Plan   A 52 year old male   1. New diagnosis of acute systolic heart failure - LVEF 25-30%, regional wall motion abnormalities. The patient is a high risk for CAD, including 64-pack year h/o  smoking, HTN, HLP, IDDM. He should undergo cardiac catheterization. However, he is CKD stage 3. We will treat his symptoms for now, start him on oral diuretics - Lasix 40 mg po daily, continue ACEI, aspirin. Some improvement od LE edema, but still DOE, orthopnea, NYHA III. An exercise nuclear stress test to evaluate for scar/ischemia is scheduled for 01/16/2012. The patient is advised not to work until then. 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  2. Hypertension - controlled  3. IDDM, type 2, recently started on insulin, followed by PCP, HbA1c high 8.6%  4. Hyperlipidemia - HDL and TRIG at goal, LDL 104, goal <70, on zocor 80mg  daily, we will switch to atorvastatin 40 mg po daily.  5. COPD - 64 pack year h/o smoking, smoking cessation counseling provided  Follow up in 2 weeks. CMP to evaluate for lytes and crea/GFR today. We will adjust Lasix dose based on the results.    Dorothy Spark, MD, [facility] 01/07/2013, 9:21 AM

## 2013-01-15 ENCOUNTER — Encounter: Payer: Self-pay | Admitting: Cardiology

## 2013-01-15 ENCOUNTER — Ambulatory Visit (HOSPITAL_COMMUNITY): Payer: BC Managed Care – PPO | Attending: Cardiology | Admitting: Radiology

## 2013-01-15 VITALS — BP 124/59 | HR 111 | Ht 71.0 in | Wt 203.0 lb

## 2013-01-15 DIAGNOSIS — R079 Chest pain, unspecified: Secondary | ICD-10-CM

## 2013-01-15 DIAGNOSIS — I10 Essential (primary) hypertension: Secondary | ICD-10-CM | POA: Insufficient documentation

## 2013-01-15 DIAGNOSIS — E785 Hyperlipidemia, unspecified: Secondary | ICD-10-CM | POA: Insufficient documentation

## 2013-01-15 DIAGNOSIS — I5021 Acute systolic (congestive) heart failure: Secondary | ICD-10-CM | POA: Insufficient documentation

## 2013-01-15 DIAGNOSIS — R0602 Shortness of breath: Secondary | ICD-10-CM

## 2013-01-15 DIAGNOSIS — I509 Heart failure, unspecified: Secondary | ICD-10-CM | POA: Insufficient documentation

## 2013-01-15 DIAGNOSIS — E119 Type 2 diabetes mellitus without complications: Secondary | ICD-10-CM | POA: Insufficient documentation

## 2013-01-15 MED ORDER — REGADENOSON 0.4 MG/5ML IV SOLN
0.4000 mg | Freq: Once | INTRAVENOUS | Status: AC
  Administered 2013-01-15: 0.4 mg via INTRAVENOUS

## 2013-01-15 MED ORDER — TECHNETIUM TC 99M SESTAMIBI GENERIC - CARDIOLITE
33.0000 | Freq: Once | INTRAVENOUS | Status: AC | PRN
  Administered 2013-01-15: 33 via INTRAVENOUS

## 2013-01-15 MED ORDER — TECHNETIUM TC 99M SESTAMIBI GENERIC - CARDIOLITE
11.0000 | Freq: Once | INTRAVENOUS | Status: AC | PRN
  Administered 2013-01-15: 11 via INTRAVENOUS

## 2013-01-15 NOTE — Progress Notes (Signed)
[facility] [address] [place], [place] [address] [phone]    Cardiology Nuclear Med Study  Anthony Bullock is a 53 y.o. male     MRN : [id number]     DOB: [birth year]  Procedure Date: 01/15/2013  Nuclear Med Background Indication for Stress Test:  Evaluation for Ischemia History:  No known CAD, Echo 2014 EF 25-30%, TTE (mod. MR and TR) Cardiac Risk Factors: History of Smoking, Hypertension, Lipids and IDDM  Symptoms:  Chest Pain, Chest Pain with Exertion (last date of chest discomfort was this morning) and DOE   Nuclear Pre-Procedure Caffeine/Decaff Intake:  7:30pm NPO After: 11:00pm   Lungs:  clear O2 Sat: 98% on room air. IV 0.9% NS with Angio Cath:  22g  IV Site: L Hand  IV Started by:  Matilde Haymaker, RN  Chest Size (in):  42 Cup Size: n/a  Height: 5\' 11"  (1.803 m)  Weight:  203 lb (92.08 kg)  BMI:  Body mass index is 28.33 kg/(m^2). Tech Comments: No Insulin this am,CBG 118    Nuclear Med Study 1 or 2 day study: 1 day  Stress Test Type:  Treadmill/Lexiscan  Reading MD: n/a  Order Authorizing Provider:  Filiberto Pinks  Resting Radionuclide: Technetium 20m Sestamibi  Resting Radionuclide Dose: 11.0 mCi   Stress Radionuclide:  Technetium 43m Sestamibi  Stress Radionuclide Dose: 33.0 mCi           Stress Protocol Rest HR: 111 Stress HR: 133  Rest BP: 124/59 Stress BP: 184/74  Exercise Time (min): n/a METS: n/a           Dose of Adenosine (mg):  n/a Dose of Lexiscan: 0.4 mg  Dose of Atropine (mg): n/a Dose of Dobutamine: n/a mcg/kg/min (at max HR)  Stress Test Technologist: Glade Lloyd, BS-ES  Nuclear Technologist:  Charlton Amor, CNMT     Rest Procedure:  Myocardial perfusion imaging was performed at rest 45 minutes following the intravenous administration of Technetium 52m Sestamibi. Rest ECG: Normal sinus rhythm. Mild interventricular conduction delay  Stress Procedure:  The patient received IV Lexiscan 0.4 mg  over 15-seconds with concurrent low level exercise and then Technetium 52m Sestamibi was injected at 30-seconds while the patient continued walking one more minute.  Quantitative spect images were obtained after a 45-minute delay.  Attempted to stress patient on the Bruce Protocol but he became very SOB and had to stop. Switched to a low level Lexiscan.  During the infusion of Lexiscan, the patient complained of worsening SOB but this began to resolve in recovery.  Stress ECG: No significant change from baseline ECG  QPS Raw Data Images:  Normal; no motion artifact; normal heart/lung ratio. Stress Images:  Small area of mild decreased uptake at the apical inferior segment, apical anterior segment, and the apical cap. This area is fixed. Rest Images:  Images rest is the same as stress Subtraction (SDS):  No evidence of ischemia. Transient Ischemic Dilatation (Normal <1.22):  1.00 Lung/Heart Ratio (Normal <0.45):  0.45  Quantitative Gated Spect Images QGS EDV:  226 ml QGS ESV:  164 ml  Impression Exercise Capacity:  Lexiscan with low level exercise. BP Response:  Normal blood pressure response. Clinical Symptoms:  There was shortness of breath when the treadmill was started. The patient was switched to  [facility] with low level stress ECG Impression:  No significant ST segment change suggestive of ischemia. Comparison with Prior Nuclear Study: No previous nuclear study performed  Overall  Impression:  There is significant left ventricular dysfunction. There may be slight scar at the apex. There is no significant ischemia.   LV Ejection Fraction: 27%.  LV Wall Motion:  Global hypokinesis. Significant left ventricular dilatation.  Dola Argyle, MD

## 2013-01-16 ENCOUNTER — Encounter: Payer: Self-pay | Admitting: Cardiology

## 2013-01-16 ENCOUNTER — Ambulatory Visit (INDEPENDENT_AMBULATORY_CARE_PROVIDER_SITE_OTHER): Payer: BC Managed Care – PPO | Admitting: Cardiology

## 2013-01-16 VITALS — BP 161/90 | HR 74 | Ht 70.0 in | Wt 213.0 lb

## 2013-01-16 DIAGNOSIS — J309 Allergic rhinitis, unspecified: Secondary | ICD-10-CM

## 2013-01-16 DIAGNOSIS — I5021 Acute systolic (congestive) heart failure: Secondary | ICD-10-CM

## 2013-01-16 DIAGNOSIS — E785 Hyperlipidemia, unspecified: Secondary | ICD-10-CM

## 2013-01-16 LAB — BASIC METABOLIC PANEL
BUN: 31 mg/dL — ABNORMAL HIGH (ref 6–23)
CO2: 26 mEq/L (ref 19–32)
Calcium: 8.9 mg/dL (ref 8.4–10.5)
Chloride: 107 mEq/L (ref 96–112)
Creatinine, Ser: 2.3 mg/dL — ABNORMAL HIGH (ref 0.4–1.5)
GFR: 31.66 mL/min — ABNORMAL LOW (ref >60.00)
Glucose, Bld: 148 mg/dL — ABNORMAL HIGH (ref 70–99)
Potassium: 4.7 mEq/L (ref 3.5–5.1)
Sodium: 139 mEq/L (ref 135–145)

## 2013-01-16 LAB — BRAIN NATRIURETIC PEPTIDE: Pro B Natriuretic peptide (BNP): 560 pg/mL — ABNORMAL HIGH (ref 0.0–100.0)

## 2013-01-16 MED ORDER — CARVEDILOL 6.25 MG PO TABS: 6.2500 mg | ORAL_TABLET | Freq: Two times a day (BID) | ORAL | Status: DC

## 2013-01-16 NOTE — Progress Notes (Signed)
Patient ID: Anthony Bullock, male   DOB: 25-Apr-1960, 53 y.o.   MRN: [id number]     Patient Name: Anthony Bullock Date of Encounter: 01/16/2013  Primary Care Provider:  [name], MD Primary Cardiologist:  Ena Dawley, H  Problem List   Past Medical History  Diagnosis Date  . Allergic rhinitis   . Diabetes mellitus type II   . Hyperlipidemia   . Hypertension   . Urethral stricture   . Kidney stones    Past Surgical History  Procedure Laterality Date  . Reapea urethral surgery for recurrent obstruction  2011  . Total knee arthroplasty  2007   Allergies  Allergies  Allergen Reactions  . Morphine Sulfate     REACTION: Itches all over   HPI  53 year old male with h/o hypertension, IDDM, hyperlipidemia, CKD stage 3, who is coming with concern of exertional shortness of breath. The partient states that he feels SOB with minimal activities. He also noticed lower extremity swelling in the last couple of weeks. The patient experiences orthopnea and has been sleeping in recliner in the last couple of weeks. He denies chest pain, palpitations or syncope.   The patient underwent a TTE that showed severely dilated left ventricle with severely decreased systolic function and LVEF 25-30%.  There are regional wall motion abnormalities. Moderate MR and TR.  At 2 week follow up, the patient reported significant improvement of lower extremity swelling, however he still has significant DOE with minimal exertion (work in the house). No chest pain. Continues having orthopnea, sleeps in recliner.  Today the patient reports worsening of symptoms, with dyspnea on minimal exertion and some LE edema. He was sleeping in bed for few nights but is now back in recliner. We held his Lasix for the last 3 days as his crea increased from 1.7 -> 2.3.  Home Medications  Prior to Admission medications   Medication Sig Start Date End Date Taking? Authorizing Provider  aspirin 81 MG tablet Take 81 mg by  mouth daily.     Yes Historical Provider, MD  fenofibrate 160 MG tablet TAKE 1 TABLET DAILY 05/23/12  Yes [name], MD  gabapentin (NEURONTIN) 300 MG capsule Take 1 capsule (300 mg total) by mouth 3 (three) times daily. 11/10/11  Yes [name], MD  Glucose Blood (ACCU-CHEK EASY TEST VI) by In Vitro route QID.   Yes Historical Provider, MD  glucose blood (ACCU-CHEK INSTANT PLUS TEST) test strip Use as instructed daily 05/02/11 12/25/12 Yes [name], MD  insulin glargine (LANTUS) 100 UNIT/ML injection 50 Units daily. Use as directed daily 02/10/12  Yes [name], MD  insulin lispro (HUMALOG) 100 UNIT/ML injection Inject 14 Units into the skin 3 (three) times daily with meals. 10/29/12 10/29/13 Yes [name], MD  Insulin Pen Needle 32G X 4 MM MISC Use three times daily with insulin 11/10/11  Yes [name], MD  ramipril (ALTACE) 10 MG capsule TAKE 1 CAPSULE DAILY 05/23/12  Yes [name], MD  saxagliptin HCl (ONGLYZA) 5 MG TABS tablet Take 1 tablet (5 mg total) by mouth daily. 10/29/12  Yes [name], MD  sildenafil (VIAGRA) 100 MG tablet Take 50-100 mg by mouth daily as needed. 09/08/11 12/26/14 Yes [name], MD  simvastatin (ZOCOR) 80 MG tablet TAKE 1 TABLET AT BEDTIME 05/23/12  Yes [name], MD    Family History  Family History  Problem Relation Age of Onset  .  Alcohol abuse Other   . Heart disease Other   . Diabetes Other   . Stroke Other   . Anemia Sister     Social History  History   Social History  . Marital Status: Married    Spouse Name: N/A    Number of Children: N/A  . Years of Education: N/A   Occupational History  . Not on file.   Social History Main Topics  . Smoking status: Former Smoker -- 2.00 packs/day for 32 years    Types: Cigarettes    Quit date: 05/11/2010  . Smokeless tobacco: Not on file  . Alcohol Use: No  . Drug Use: No  . Sexual Activity: Not on file   Other Topics  Concern  . Not on file   Social History Narrative  . No narrative on file     Review of Systems, as per HPI, otherwise negative General:  No chills, fever, night sweats or weight changes.  Cardiovascular:  No chest pain, dyspnea on exertion, edema, orthopnea, palpitations, paroxysmal nocturnal dyspnea. Dermatological: No rash, lesions/masses Respiratory: No cough, dyspnea Urologic: No hematuria, dysuria Abdominal:   No nausea, vomiting, diarrhea, bright red blood per rectum, melena, or hematemesis Neurologic:  No visual changes, wkns, changes in mental status. All other systems reviewed and are otherwise negative except as noted above.  Physical Exam  Blood pressure 161/90, pulse 74, height 5\' 10"  (1.778 m), weight 213 lb (96.616 kg).  General: Pleasant, NAD Psych: Normal affect. Neuro: Alert and oriented X 3. Moves all extremities spontaneously. HEENT: Normal  Neck: Supple without bruits, JVD + 4 cm B/L. Lungs:  Resp regular and unlabored, crackles B/L at the basis Heart: RRR no s3, s4, holosystolic murmur best heart at the apex. Abdomen: Soft, non-tender, non-distended, BS + x 4.  Extremities: No clubbing, cyanosis, trace edema at the ankles. DP/PT/Radials 2+ and equal bilaterally.  Labs:  No results found for this basename: CKTOTAL, CKMB, TROPONINI,  in the last 72 hours Lab Results  Component Value Date   WBC 9.2 04/13/2010   HGB 13.0 04/13/2010   HCT 38.0* 04/13/2010   MCV 85.1 04/13/2010   PLT 211.0 04/13/2010   No results found for this basename: NA, K, CL, CO2, BUN, CREATININE, CALCIUM, LABALBU, PROT, BILITOT, ALKPHOS, ALT, AST, GLUCOSE,  in the last 168 hours Lab Results  Component Value Date   CHOL 165 11/30/2012   HDL 45.70 11/30/2012   LDLCALC 104* 11/30/2012   TRIG 77.0 11/30/2012   BNP 544  Accessory Clinical Findings  ECG - SR, LAE, non-specific ST-T wave abnormalities  Echocardiogram: 12/12/2012  Left ventricle: The cavity size was severely dilated.  Wall thickness was increased in a pattern of mild LVH. Systolic function was severely reduced. The estimated ejection fraction was in the range of 25% to 30%. Regional wall motion abnormalities: There is moderate hypokinesis of the entireanteroseptal myocardium. Doppler parameters are consistent with abnormal left ventricular relaxation (grade 1 diastolic dysfunction). ------------------------------------------------------------ Aortic valve: Structurally normal valve. Trileaflet. Cusp separation was normal. Doppler: Transvalvular velocity was within the normal range. There was no stenosis. No regurgitation. Mitral valve: Mildly thickened leaflets . Doppler: Moderate regurgitation. ------------------------------------------------------------ Left atrium: The atrium was moderately dilated. ------------------------------------------------------------ Right ventricle: The cavity size was normal. Wall thickness was normal. Systolic function was normal. ------------------------------------------------------------ Pulmonic valve: Structurally normal valve. Cusp separation was normal. Doppler: Transvalvular velocity was within the normal range. No regurgitation. ------------------------------------------------------------ Tricuspid valve: Doppler: Moderate regurgitation. ------------------------------------------------------------ Right atrium: The atrium was normal  in size. ------------------------------------------------------------ Pericardium: The pericardium was normal in appearance.   Lexiscan nuclear stress test 01/15/2013 Impression  Exercise Capacity: Lexiscan with low level exercise.  BP Response: Normal blood pressure response.  Clinical Symptoms: There was shortness of breath when the treadmill was started. The patient was switched to [facility] with low level stress  ECG Impression: No significant ST segment change suggestive of ischemia.  Comparison with Prior Nuclear Study:  No previous nuclear study performed  Overall Impression: There is significant left ventricular dysfunction. There may be slight scar at the apex. There is no significant ischemia.  LV Ejection Fraction: 27%. LV Wall Motion: Global hypokinesis. Significant left ventricular dilatation.    Assessment & Plan   A 53 year old male   1. New diagnosis of acute systolic heart failure - LVEF 25-30%, regional wall motion abnormalities. The patient is a high risk for CAD, including 64-pack year h/o smoking, HTN, HLP, IDDM. A nuclear stress test showed very poor functional capacity and was converted to Lexiscan test. It showed small apical scar and no ischemia. I still believe he should undergo a cath, however we are limited with CKD stage 3 (GFR 31).  At this point we are limited with his treatment options as he is symptomatic NYHA III and his kidney function deteriorated with a small dose of lasix 40 po daily . He is significantly fluid overloaded. We will check BMP today and adjust Lasix dose. His QRS is 100 ms so we wouldn't qualify for a biventricular pacemaker. We will refer him to a heart failure clinic for more advanced HF therapies and possible evaluation for a heart transplant.  The patient is advised not to work until then. 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  2. Hypertension - uncontrolled today, he hasn't taken his meds yet, we will add coreg 6.25 mg po daily, he is advised not to take allegra -D as it contains sympatomimetics  3. IDDM, type 2, recently started on insulin, followed by PCP, HbA1c high 8.6%  4. Hyperlipidemia - HDL and TRIG at goal, LDL 104, goal <70, on zocor 80mg  daily, we will switch to atorvastatin 40 mg po daily.  5. COPD - 64 pack year h/o smoking, smoking cessation counseling provided  Follow up in 2 weeks. BMP to evaluate for lytes and crea/GFR today. We will adjust Lasix dose based on the results.    Dorothy Spark, MD,  [facility] 01/16/2013, 8:51 AM

## 2013-01-16 NOTE — Addendum Note (Signed)
**Note De-Identified Rianne Degraaf Obfuscation** Addended by: Dennie Fetters on: 01/16/2013 09:33 AM   Modules accepted: Orders

## 2013-01-16 NOTE — Patient Instructions (Addendum)
Your physician recommends that you have for lab work today BMP,BNP  Your physician has recommended you make the following change in your medication:   1. Start Coreg 6.25mg  twice daily   Your physician recommends that you schedule a follow-up appointment in: 2 weeks with DR. Meda Coffee  You have been referred to Heart Failure Clinic

## 2013-01-17 NOTE — Progress Notes (Signed)
Quick Note:  Dr. Meda Coffee has already called the patient. ______

## 2013-01-23 ENCOUNTER — Encounter (HOSPITAL_COMMUNITY): Payer: Self-pay

## 2013-01-23 ENCOUNTER — Ambulatory Visit (HOSPITAL_COMMUNITY)
Admission: RE | Admit: 2013-01-23 | Discharge: 2013-01-23 | Disposition: A | Discharge status: Home / Self Care | Payer: BC Managed Care – PPO | Source: Ambulatory Visit | Attending: Internal Medicine | Admitting: Internal Medicine

## 2013-01-23 VITALS — BP 142/68 | HR 93 | Ht 71.0 in | Wt 209.8 lb

## 2013-01-23 DIAGNOSIS — R9439 Abnormal result of other cardiovascular function study: Secondary | ICD-10-CM

## 2013-01-23 DIAGNOSIS — E785 Hyperlipidemia, unspecified: Secondary | ICD-10-CM | POA: Insufficient documentation

## 2013-01-23 DIAGNOSIS — G609 Hereditary and idiopathic neuropathy, unspecified: Secondary | ICD-10-CM | POA: Insufficient documentation

## 2013-01-23 DIAGNOSIS — I509 Heart failure, unspecified: Secondary | ICD-10-CM

## 2013-01-23 DIAGNOSIS — I129 Hypertensive chronic kidney disease with stage 1 through stage 4 chronic kidney disease, or unspecified chronic kidney disease: Secondary | ICD-10-CM | POA: Insufficient documentation

## 2013-01-23 DIAGNOSIS — N183 Chronic kidney disease, stage 3 unspecified: Secondary | ICD-10-CM | POA: Insufficient documentation

## 2013-01-23 DIAGNOSIS — Z79899 Other long term (current) drug therapy: Secondary | ICD-10-CM | POA: Insufficient documentation

## 2013-01-23 DIAGNOSIS — E119 Type 2 diabetes mellitus without complications: Secondary | ICD-10-CM | POA: Insufficient documentation

## 2013-01-23 DIAGNOSIS — Z794 Long term (current) use of insulin: Secondary | ICD-10-CM | POA: Insufficient documentation

## 2013-01-23 DIAGNOSIS — Z7982 Long term (current) use of aspirin: Secondary | ICD-10-CM | POA: Insufficient documentation

## 2013-01-23 DIAGNOSIS — I5022 Chronic systolic (congestive) heart failure: Secondary | ICD-10-CM | POA: Insufficient documentation

## 2013-01-23 DIAGNOSIS — Z87891 Personal history of nicotine dependence: Secondary | ICD-10-CM | POA: Insufficient documentation

## 2013-01-23 LAB — HM DIABETES EYE EXAM

## 2013-01-23 NOTE — Progress Notes (Signed)
Patient ID: Anthony Bullock, male   DOB: December 17, 1960, 53 y.o.   MRN: [id number]  PCP: Dr. Evelena Peat Primary Cardiologist: Dr. Andreas Ohm Has seen nephrology in the past.   HPI: Anthony Bullock is a 53 yo male with h/o hypertension, IDDM, hyperlipidemia, HTN, tobacco use (quit 2012,), COPD, CKD stage 3, peripheral neuropathy and newly diagnosed systolic HF.   Began to have HF symptoms in November 2014.  He had ECHO 12/2012 which showed EF 25-30% with mod HK of entire anteroseptal wall, grade 1 DD, mod MR and mod TR. Due to CKD underwent Lexiscan myoview, which showed slight scar of apex and no significant ischemia.   Recently admitted to [facility] for ADHF. They diuresed him with IV lasix, cut his coreg dose back to 3.125 mg BID and increased him home lasix dose back to 40 mg daily. Discharge weight 202 lbs.   Since he has been home he has been feeling pretty well. Weight is starting to trend up 205-207 on home scale. Denies SOB, PND or CP. Currently sleeping in a recliner, worried that may get SOB and nervous about laying flat. Has not been following low salt diet and has been drinking more than 2L a day. He saw Dr. Delton See who felt he would likely need cath but was concerned about his CKD. He was referred here to discuss his HF options.  SH: Married, lives in [place]. Works FT at [employer]        No longer smokes, quit 2012 smoked for 36 years  FH: Mother deceased, bladder         Father deceased, melenoma        Paternal Grandmother- CAD  ROS: All systems negative except as listed in HPI, PMH and Problem List.  Past Medical History  Diagnosis Date  . Allergic rhinitis   . Diabetes mellitus type II   . Hyperlipidemia   . Hypertension   . Urethral stricture   . Kidney stones     Current Outpatient Prescriptions  Medication Sig Dispense Refill  . aspirin 81 MG tablet Take 162 mg by mouth daily.       [name] atorvastatin (LIPITOR) 40 MG tablet Take 1 tablet (40 mg total) by  mouth daily.  90 tablet  3  . carvedilol (COREG) 6.25 MG tablet Take 3.125 mg by mouth 2 (two) times daily with a meal.      . fenofibrate 160 MG tablet TAKE 1 TABLET DAILY  90 tablet  2  . furosemide (LASIX) 40 MG tablet Take 40 mg by mouth daily.      [name] gabapentin (NEURONTIN) 300 MG capsule Take 1 capsule (300 mg total) by mouth 3 (three) times daily.  270 capsule  3  . Glucose Blood (ACCU-CHEK EASY TEST VI) by In Vitro route QID.      [name] glucose blood (ACCU-CHEK INSTANT PLUS TEST) test strip Use as instructed daily  100 each  12  . insulin glargine (LANTUS) 100 UNIT/ML injection 50 Units daily. Use as directed daily      . insulin lispro (HUMALOG) 100 UNIT/ML injection Inject 14 Units into the skin 3 (three) times daily with meals.      . Insulin Pen Needle 32G X 4 MM MISC Use three times daily with insulin  300 each  3  . ramipril (ALTACE) 10 MG capsule TAKE 1 CAPSULE DAILY  90 capsule  2  . saxagliptin HCl (ONGLYZA) 5 MG TABS tablet Take 1 tablet (5 mg  total) by mouth daily.  90 tablet  3   No current facility-administered medications for this encounter.   Filed Vitals:   01/23/13 1335  BP: 142/68  Pulse: 93  Height: 5\' 11"  (1.803 m)  Weight: 209 lb 12.8 oz (95.165 kg)  SpO2: 97%    PHYSICAL EXAM: General:  Well appearing. No resp difficulty; wife  HEENT: normal Neck: supple. JVP 7-8. Carotids 2+ bilaterally; no bruits. No lymphadenopathy or thryomegaly appreciated. Cor: PMI normal. Regular rate & rhythm. No rubs, gallops or murmurs. Lungs: clear Abdomen: soft, nontender, nondistended. No hepatosplenomegaly. No bruits or masses. Good bowel sounds. Extremities: no cyanosis, clubbing, rash, edema, dorsalis pedis pulses bilaterally non-palpable. Neuro: alert & orientedx3, cranial nerves grossly intact. Moves all 4 extremities w/o difficulty. Affect pleasant.   ASSESSMENT & PLAN:  1) Chronic systolic HF: EF 25-30% - Newly diagnosed HF. He was admitted to [facility] over the  weekend with increased SOB and orthopnea. Diuresed in the hospital and coreg was cut back to 3.125 mg BID. Discharge weight 202 lbs.  - He had [facility] which was not definitive. Agree that he needs LHC to assess coronaries, however kidney function has been an issue. Likely that he has CAD with 36 years of smoking and uncontrolled DM. Will admit next Monday for cath Tuesday. Will stop ACE-I on admission and hydrate being careful to not overload. - Currently NYHA II symptoms and volume status stable. Discussed in depth the use of sliding scale diuretics. - Will not titrate any medications at this time, continue coreg 3.125 mg BID and ramipril 10 mg daily. - Provided with HF education booklet and discussed the importance of daily weights, a low sodium diet, and fluid restriction (less than 2 L a day). Instructed to call the HF clinic if weight increases more than 3 lbs overnight or 5 lbs in a week.  2) HTN - Slightly elevated. Will not titrate any medications currently and will assess after cath 3) CKD stage III - Stable. Baseline Cr 1.7-2.1. Continue to follow closely. 4) Abnormal stress Myoview   Anthony Bullock B NP-C 4:50 PM  Patient seen and examined with Anthony Potash, NP. We discussed all aspects of the encounter. I agree with the assessment and plan as stated above. Given his risk factors and regional wall motion abnormalities, I am very concerned that he has a severe ischemic cardiomyopathy. I discussed the risks and indications for cath with him and his wife with particular attention to worsening renal function and need for dialysis in depth. I told him I felt this risk was small but certainly not zero. That said, Myoview seems to have significant viability and if we can revascularize him he may have significant recovery of LV function. They have agreed to proceed. We will admit next week for pre-hydration and holding of ACE prior to cath. We will make every attempt to limit contrast  exposure. Will continue current HF meds now and see him back after cath to titrate as tolerated. Extensive HF education provided. Case d/w Dr. Delton See.  Total time spent 60 minutes with over 2/3 of that time discussing above issues.  Anthony Godbey,MD 1:48 PM

## 2013-01-23 NOTE — Patient Instructions (Signed)
Will admit next Monday for scheduled heart catheterization on Tuesday.  Continue current medications as prescribed.  F/U 3 weeks   Do the following things EVERYDAY: 1) Weigh yourself in the morning before breakfast. Write it down and keep it in a log. 2) Take your medicines as prescribed 3) Eat low salt foods-Limit salt (sodium) to 2000 mg per day.  4) Stay as active as you can everyday 5) Limit all fluids for the day to less than 2 liters 6)

## 2013-01-25 ENCOUNTER — Telehealth: Payer: Self-pay | Admitting: Cardiology

## 2013-01-25 NOTE — Telephone Encounter (Signed)
**Note De-Identified  Obfuscation** Pt is advised that Dr Meda Coffee has signed his [employer] paperwork and that the paperwork was given back to medical records to fax to [employer]. Also the pt rescheduled his f/u with Dr Meda Coffee because he is scheduled to have a cath the day before.

## 2013-01-25 NOTE — Telephone Encounter (Signed)
New message   Patient stated he was just speaking with you . Need for you to call him back

## 2013-01-27 DIAGNOSIS — I5022 Chronic systolic (congestive) heart failure: Secondary | ICD-10-CM | POA: Insufficient documentation

## 2013-01-27 DIAGNOSIS — R9439 Abnormal result of other cardiovascular function study: Secondary | ICD-10-CM | POA: Insufficient documentation

## 2013-01-28 ENCOUNTER — Inpatient Hospital Stay (HOSPITAL_COMMUNITY)
Admission: AD | Admit: 2013-01-28 | Discharge: 2013-01-31 | DRG: 287 | Disposition: A | Discharge status: Home / Self Care | Payer: BC Managed Care – PPO | Source: Ambulatory Visit | Attending: Internal Medicine | Admitting: Internal Medicine

## 2013-01-28 ENCOUNTER — Encounter (HOSPITAL_COMMUNITY): Payer: Self-pay | Admitting: Urology

## 2013-01-28 DIAGNOSIS — I428 Other cardiomyopathies: Secondary | ICD-10-CM | POA: Diagnosis present

## 2013-01-28 DIAGNOSIS — E785 Hyperlipidemia, unspecified: Secondary | ICD-10-CM | POA: Diagnosis present

## 2013-01-28 DIAGNOSIS — R9439 Abnormal result of other cardiovascular function study: Secondary | ICD-10-CM

## 2013-01-28 DIAGNOSIS — Z87891 Personal history of nicotine dependence: Secondary | ICD-10-CM

## 2013-01-28 DIAGNOSIS — N183 Chronic kidney disease, stage 3 unspecified: Secondary | ICD-10-CM | POA: Diagnosis present

## 2013-01-28 DIAGNOSIS — I2510 Atherosclerotic heart disease of native coronary artery without angina pectoris: Secondary | ICD-10-CM | POA: Diagnosis present

## 2013-01-28 DIAGNOSIS — J4489 Other specified chronic obstructive pulmonary disease: Secondary | ICD-10-CM | POA: Diagnosis present

## 2013-01-28 DIAGNOSIS — Z794 Long term (current) use of insulin: Secondary | ICD-10-CM

## 2013-01-28 DIAGNOSIS — I129 Hypertensive chronic kidney disease with stage 1 through stage 4 chronic kidney disease, or unspecified chronic kidney disease: Secondary | ICD-10-CM | POA: Diagnosis present

## 2013-01-28 DIAGNOSIS — I252 Old myocardial infarction: Secondary | ICD-10-CM

## 2013-01-28 DIAGNOSIS — Z96659 Presence of unspecified artificial knee joint: Secondary | ICD-10-CM

## 2013-01-28 DIAGNOSIS — Z79899 Other long term (current) drug therapy: Secondary | ICD-10-CM

## 2013-01-28 DIAGNOSIS — Z7982 Long term (current) use of aspirin: Secondary | ICD-10-CM

## 2013-01-28 DIAGNOSIS — I509 Heart failure, unspecified: Secondary | ICD-10-CM | POA: Diagnosis present

## 2013-01-28 DIAGNOSIS — E1169 Type 2 diabetes mellitus with other specified complication: Secondary | ICD-10-CM | POA: Diagnosis present

## 2013-01-28 DIAGNOSIS — E119 Type 2 diabetes mellitus without complications: Secondary | ICD-10-CM

## 2013-01-28 DIAGNOSIS — G609 Hereditary and idiopathic neuropathy, unspecified: Secondary | ICD-10-CM | POA: Diagnosis present

## 2013-01-28 DIAGNOSIS — I5022 Chronic systolic (congestive) heart failure: Principal | ICD-10-CM | POA: Diagnosis present

## 2013-01-28 DIAGNOSIS — J449 Chronic obstructive pulmonary disease, unspecified: Secondary | ICD-10-CM | POA: Diagnosis present

## 2013-01-28 DIAGNOSIS — Z885 Allergy status to narcotic agent status: Secondary | ICD-10-CM

## 2013-01-28 HISTORY — DX: Chronic systolic (congestive) heart failure: I50.22

## 2013-01-28 LAB — BASIC METABOLIC PANEL
BUN: 58 mg/dL — ABNORMAL HIGH (ref 6–23)
CO2: 27 mEq/L (ref 19–32)
Calcium: 8.7 mg/dL (ref 8.4–10.5)
Chloride: 101 mEq/L (ref 96–112)
Creatinine, Ser: 2.64 mg/dL — ABNORMAL HIGH (ref 0.50–1.35)
GFR calc Af Amer: 31 mL/min — ABNORMAL LOW (ref >90)
GFR calc non Af Amer: 26 mL/min — ABNORMAL LOW (ref >90)
Glucose, Bld: 117 mg/dL — ABNORMAL HIGH (ref 70–99)
Potassium: 4.5 mEq/L (ref 3.7–5.3)
Sodium: 139 mEq/L (ref 137–147)

## 2013-01-28 LAB — CBC
HCT: 37.7 % — ABNORMAL LOW (ref 39.0–52.0)
Hemoglobin: 12.2 g/dL — ABNORMAL LOW (ref 13.0–17.0)
MCH: 25.1 pg — ABNORMAL LOW (ref 26.0–34.0)
MCHC: 32.4 g/dL (ref 30.0–36.0)
MCV: 77.4 fL — ABNORMAL LOW (ref 78.0–100.0)
Platelets: 209 10*3/uL (ref 150–400)
RBC: 4.87 MIL/uL (ref 4.22–5.81)
RDW: 14.6 % (ref 11.5–15.5)
WBC: 7.2 10*3/uL (ref 4.0–10.5)

## 2013-01-28 LAB — GLUCOSE, CAPILLARY: Glucose-Capillary: 90 mg/dL (ref 70–99)

## 2013-01-28 LAB — PROTIME-INR
INR: 1.05 (ref 0.00–1.49)
Prothrombin Time: 13.5 seconds (ref 11.6–15.2)

## 2013-01-28 MED ORDER — FENOFIBRATE 160 MG PO TABS
160.0000 mg | ORAL_TABLET | Freq: Every day | ORAL | Status: DC
  Administered 2013-01-29 – 2013-01-31 (×3): 160 mg via ORAL
  Filled 2013-01-28 (×3): qty 1

## 2013-01-28 MED ORDER — ATORVASTATIN CALCIUM 40 MG PO TABS
40.0000 mg | ORAL_TABLET | Freq: Every day | ORAL | Status: DC
  Administered 2013-01-28 – 2013-01-30 (×3): 40 mg via ORAL
  Filled 2013-01-28 (×4): qty 1

## 2013-01-28 MED ORDER — SODIUM CHLORIDE 0.9 % IV SOLN: 250.0000 mL | INTRAVENOUS | Status: DC | PRN

## 2013-01-28 MED ORDER — ASPIRIN 81 MG PO CHEW
81.0000 mg | CHEWABLE_TABLET | ORAL | Status: AC
  Administered 2013-01-29: 81 mg via ORAL
  Filled 2013-01-28: qty 1

## 2013-01-28 MED ORDER — INSULIN GLARGINE 100 UNIT/ML ~~LOC~~ SOLN
50.0000 [IU] | Freq: Every day | SUBCUTANEOUS | Status: DC
  Filled 2013-01-28 (×3): qty 0.5

## 2013-01-28 MED ORDER — SODIUM CHLORIDE 0.9 % IJ SOLN: 3.0000 mL | INTRAMUSCULAR | Status: DC | PRN

## 2013-01-28 MED ORDER — INSULIN ASPART 100 UNIT/ML ~~LOC~~ SOLN
14.0000 [IU] | Freq: Three times a day (TID) | SUBCUTANEOUS | Status: DC
  Administered 2013-01-29 – 2013-01-30 (×2): 14 [IU] via SUBCUTANEOUS

## 2013-01-28 MED ORDER — CARVEDILOL 6.25 MG PO TABS
6.2500 mg | ORAL_TABLET | Freq: Two times a day (BID) | ORAL | Status: DC
  Administered 2013-01-29 – 2013-01-31 (×5): 6.25 mg via ORAL
  Filled 2013-01-28 (×7): qty 1

## 2013-01-28 MED ORDER — SODIUM CHLORIDE 0.9 % IJ SOLN
3.0000 mL | Freq: Two times a day (BID) | INTRAMUSCULAR | Status: DC
  Administered 2013-01-29: 3 mL via INTRAVENOUS

## 2013-01-28 MED ORDER — INSULIN ASPART 100 UNIT/ML ~~LOC~~ SOLN
0.0000 [IU] | Freq: Three times a day (TID) | SUBCUTANEOUS | Status: DC
  Administered 2013-01-29: 3 [IU] via SUBCUTANEOUS
  Administered 2013-01-30: 2 [IU] via SUBCUTANEOUS
  Administered 2013-01-30: 3 [IU] via SUBCUTANEOUS
  Administered 2013-01-31: 2 [IU] via SUBCUTANEOUS

## 2013-01-28 MED ORDER — ENOXAPARIN SODIUM 40 MG/0.4ML ~~LOC~~ SOLN
40.0000 mg | SUBCUTANEOUS | Status: DC
  Administered 2013-01-28 – 2013-01-29 (×2): 40 mg via SUBCUTANEOUS
  Filled 2013-01-28 (×3): qty 0.4

## 2013-01-28 MED ORDER — ASPIRIN 81 MG PO CHEW
81.0000 mg | CHEWABLE_TABLET | Freq: Every day | ORAL | Status: DC
  Administered 2013-01-29 – 2013-01-31 (×3): 81 mg via ORAL
  Filled 2013-01-28 (×3): qty 1

## 2013-01-28 MED ORDER — SODIUM CHLORIDE 0.9 % IV SOLN
INTRAVENOUS | Status: DC
  Administered 2013-01-28 – 2013-01-31 (×4): via INTRAVENOUS

## 2013-01-28 MED ORDER — SODIUM CHLORIDE 0.9 % IJ SOLN
3.0000 mL | Freq: Two times a day (BID) | INTRAMUSCULAR | Status: DC
  Administered 2013-01-28 – 2013-01-31 (×5): 3 mL via INTRAVENOUS

## 2013-01-28 MED ORDER — ATORVASTATIN CALCIUM 40 MG PO TABS: 40.0000 mg | ORAL_TABLET | Freq: Every day | ORAL | Status: DC

## 2013-01-28 MED ORDER — GABAPENTIN 300 MG PO CAPS
300.0000 mg | ORAL_CAPSULE | Freq: Three times a day (TID) | ORAL | Status: DC
  Administered 2013-01-28 – 2013-01-31 (×7): 300 mg via ORAL
  Filled 2013-01-28 (×10): qty 1

## 2013-01-28 MED ORDER — SODIUM CHLORIDE 0.9 % IV SOLN: INTRAVENOUS | Status: DC

## 2013-01-28 NOTE — Progress Notes (Signed)
Pt given heart failure booklet and cardiac cath/PCI video watched by patient. Patient also informed on how to play educational videos. Pt instructed to watch heart failure video as well as Diabetes video. Ronnette Hila, RN

## 2013-01-28 NOTE — Progress Notes (Signed)
Pt CBG 90 this PM, pr states his blood sugar is usually 115+ at nighttime. Pt states he does not want his lantus tonight. Ronnette Hila, RN

## 2013-01-28 NOTE — H&P (Signed)
Advanced Heart Failure Team History and Physical Note    HPI:    Anthony Bullock is a 53 yo male with h/o hypertension, IDDM, hyperlipidemia, HTN, tobacco use (quit 2012,), COPD, CKD stage 3, peripheral neuropathy and newly diagnosed systolic HF.   Began to have HF symptoms in November 2014. He had ECHO 12/2012 which showed EF 25-30% with mod HK of entire anteroseptal wall, grade 1 DD, mod MR and mod TR. Due to CKD underwent Lexiscan myoview, which showed slight scar of apex and no significant ischemia.   Recently admitted to [facility] for ADHF. They diuresed him with IV lasix, cut his coreg dose back to 3.125 mg BID and increased him home lasix dose back to 40 mg daily. Discharge weight 202 lbs.   Since he has been home he has been feeling pretty well. Weight is starting to trend up 205-207 on home scale. Denies SOB, PND or CP. Currently sleeping in a recliner, worried that may get SOB and nervous about laying flat. Has not been following low salt diet and has been drinking more than 2L a day.   We saw him in clinic last week and arranged for admission today for pre-hydration for cath.   Review of Systems: [y] = yes, [ ]  = no   General: Weight gain [ ] ; Weight loss [ ] ; Anorexia [ ] ; Fatigue [ ] ; Fever [ ] ; Chills [ ] ; Weakness [ ]   Cardiac: Chest pain/pressure [ ] ; Resting SOB [ ] ; Exertional SOB [ ] ; Orthopnea Blue.Reese ]; Pedal Edema [ ] ; Palpitations [ ] ; Syncope [ ] ; Presyncope [ ] ; Paroxysmal nocturnal dyspnea[ ]   Pulmonary: Cough [ ] ; Wheezing[ ] ; Hemoptysis[ ] ; Sputum [ ] ; Snoring [ ]   GI: Vomiting[ ] ; Dysphagia[ ] ; Melena[ ] ; Hematochezia [ ] ; Heartburn[ ] ; Abdominal pain [ ] ; Constipation [ ] ; Diarrhea [ ] ; BRBPR [ ]   GU: Hematuria[ ] ; Dysuria [ ] ; Nocturia[ ]   Vascular: Pain in legs with walking [ ] ; Pain in feet with lying flat [ ] ; Non-healing sores [ ] ; Stroke [ ] ; TIA [ ] ; Slurred speech [ ] ;  Neuro: Headaches[ ] ; Vertigo[ ] ; Seizures[ ] ; Paresthesias[ ] ;Blurred vision [ ] ;  Diplopia [ ] ; Vision changes [ ]   Ortho/Skin: Arthritis [ ] ; Joint pain [ ] ; Muscle pain [ ] ; Joint swelling [ ] ; Back Pain [ ] ; Rash [ ]   Psych: Depression[ ] ; Anxiety[ ]   Heme: Bleeding problems [ ] ; Clotting disorders [ ] ; Anemia [ ]   Endocrine: Diabetes Blue.Reese ]; Thyroid dysfunction[ ]   Home Medications Prior to Admission medications   Medication Sig Start Date End Date Taking? Authorizing Provider  aspirin 81 MG tablet Take 162 mg by mouth daily.    Yes Historical Provider, MD  atorvastatin (LIPITOR) 40 MG tablet Take 40 mg by mouth at bedtime.  01/07/13  Yes Dorothy Spark, MD  carvedilol (COREG) 6.25 MG tablet Take 3.125 mg by mouth 2 (two) times daily with a meal.   Yes Historical Provider, MD  fenofibrate 160 MG tablet Take 160 mg by mouth daily.   Yes Historical Provider, MD  fexofenadine (ALLEGRA) 180 MG tablet Take 180 mg by mouth daily.   Yes Historical Provider, MD  furosemide (LASIX) 40 MG tablet Take 40 mg by mouth daily.   Yes Historical Provider, MD  gabapentin (NEURONTIN) 300 MG capsule Take 1 capsule (300 mg total) by mouth 3 (three) times daily. 11/10/11  Yes [name], MD  Glucose Blood (ACCU-CHEK EASY TEST VI) by  In Vitro route QID.   Yes Historical Provider, MD  glucose blood (ACCU-CHEK INSTANT PLUS TEST) test strip Use as instructed daily 05/02/11 02/23/13 Yes [name], MD  insulin glargine (LANTUS) 100 UNIT/ML injection 50 Units daily. Use as directed daily 02/10/12  Yes [name], MD  insulin lispro (HUMALOG) 100 UNIT/ML injection Inject 14 Units into the skin 3 (three) times daily with meals. 10/29/12 10/29/13 Yes [name], MD  Insulin Pen Needle 32G X 4 MM MISC Use three times daily with insulin 11/10/11  Yes [name], MD  naproxen sodium (ANAPROX) 220 MG tablet Take 440 mg by mouth every evening.   Yes Historical Provider, MD  ramipril (ALTACE) 10 MG capsule Take 10 mg by mouth at bedtime.    Yes Historical Provider, MD   saxagliptin HCl (ONGLYZA) 5 MG TABS tablet Take 1 tablet (5 mg total) by mouth daily. 10/29/12  Yes [name], MD    Past Medical History: Past Medical History  Diagnosis Date  . Allergic rhinitis   . Diabetes mellitus type II   . Hyperlipidemia   . Hypertension   . Urethral stricture   . Kidney stones   . Myocardial infarction     "stress test showed light heart attack" on 01/16/13    Past Surgical History: Past Surgical History  Procedure Laterality Date  . Reapea urethral surgery for recurrent obstruction  2011  . Total knee arthroplasty  2007    Family History: Family History  Problem Relation Age of Onset  . Alcohol abuse Other   . Heart disease Other   . Diabetes Other   . Stroke Other   . Anemia Sister     Social History: History   Social History  . Marital Status: Married    Spouse Name: N/A    Number of Children: N/A  . Years of Education: N/A   Social History Main Topics  . Smoking status: Former Smoker -- 2.00 packs/day for 32 years    Types: Cigarettes    Quit date: 05/11/2010  . Smokeless tobacco: None  . Alcohol Use: No  . Drug Use: No  . Sexual Activity: None   Other Topics Concern  . None   Social History Narrative  . None    Allergies:  Allergies  Allergen Reactions  . Morphine Sulfate Rash    REACTION: Itches all over, red spots    Objective:    Vital Signs:   Temp:  [98.1 F (36.7 C)] 98.1 F (36.7 C) (01/19 1941) Pulse Rate:  [94] 94 (01/19 1941) Resp:  [18] 18 (01/19 1941) BP: (115)/(73) 115/73 mmHg (01/19 1941) SpO2:  [100 %] 100 % (01/19 1941) Weight:  [89.223 kg (196 lb 11.2 oz)] 89.223 kg (196 lb 11.2 oz) (01/19 1941) Last BM Date: 01/28/13 Filed Weights   01/28/13 1941  Weight: 89.223 kg (196 lb 11.2 oz)    PHYSICAL EXAM:  General: Well appearing. No resp difficulty; wife  HEENT: normal  Neck: supple. JVP 7-8. Carotids 2+ bilaterally; no bruits. No lymphadenopathy or thryomegaly appreciated.  Cor:  PMI normal. Regular rate & rhythm. No rubs, gallops or murmurs.  Lungs: clear  Abdomen: soft, nontender, nondistended. No hepatosplenomegaly. No bruits or masses. Good bowel sounds.  Extremities: no cyanosis, clubbing, rash, edema, dorsalis pedis pulses bilaterally non-palpable.  Neuro: alert & orientedx3, cranial nerves grossly intact. Moves all 4 extremities w/o difficulty. Affect pleasant.   Telemetry: SR  Labs: Basic Metabolic Panel:  Recent  Labs Lab 01/28/13 2015  NA 139  K 4.5  CL 101  CO2 27  GLUCOSE 117*  BUN 58*  CREATININE 2.64*  CALCIUM 8.7    Liver Function Tests: No results found for this basename: AST, ALT, ALKPHOS, BILITOT, PROT, ALBUMIN,  in the last 168 hours No results found for this basename: LIPASE, AMYLASE,  in the last 168 hours No results found for this basename: AMMONIA,  in the last 168 hours  CBC:  Recent Labs Lab 01/28/13 2015  WBC 7.2  HGB 12.2*  HCT 37.7*  MCV 77.4*  PLT 209    Cardiac Enzymes: No results found for this basename: CKTOTAL, CKMB, CKMBINDEX, TROPONINI,  in the last 168 hours  BNP: BNP (last 3 results)  Recent Labs  11/30/12 0941 01/16/13 0957  PROBNP 544.0* 560.0*    CBG: No results found for this basename: GLUCAP,  in the last 168 hours  Coagulation Studies:  Recent Labs  01/28/13 2015  LABPROT 13.5  INR 1.05    Other results:  Imaging:  No results found.      Assessment   1. Chronic systolic HF: EF 123XX123 2. HTN 3. CKD stage III 4. Abnormal stress myview  Plan/Discussion:    Given his risk factors and regional wall motion abnormalities, I am very concerned that he has a severe ischemic cardiomyopathy. I discussed the risks and indications for cath with him and his wife with particular attention to worsening renal function and need for dialysis in depth. I told him I felt this risk was small but certainly not zero. That said, Myoview seems to have significant viability and if we can  revascularize him he may have significant recovery of LV function. They have agreed to proceed. We will admit next week for pre-hydration and holding of ACE prior to cath. We will make every attempt to limit contrast exposure. Will continue current HF meds now and see him back after cath to titrate as tolerated. Extensive HF education provided. Case d/w Dr. Meda Coffee.   Length of Stay: 0  Anthony Bullock 01/28/2013, 10:19 PM  Advanced Heart Failure Team Pager [phone] (M-F; 7a - 4p)  Please contact [facility] for night-coverage after hours (4p -7a ) and weekends on [email or web address]

## 2013-01-28 NOTE — Progress Notes (Signed)
Pt arrived to floor in NAD, VSS, pt oriented to room and call bell. Dr. Claiborne Billings on call notified that pt is here and has questions regarding Ramipril dose tonight. Will continue to monitor. Ronnette Hila, RN

## 2013-01-29 ENCOUNTER — Ambulatory Visit (HOSPITAL_COMMUNITY)
Admission: RE | Admit: 2013-01-29 | Payer: BC Managed Care – PPO | Source: Ambulatory Visit | Admitting: Internal Medicine

## 2013-01-29 ENCOUNTER — Other Ambulatory Visit: Payer: Self-pay

## 2013-01-29 LAB — BASIC METABOLIC PANEL
BUN: 56 mg/dL — ABNORMAL HIGH (ref 6–23)
CO2: 26 mEq/L (ref 19–32)
Calcium: 8.6 mg/dL (ref 8.4–10.5)
Chloride: 106 mEq/L (ref 96–112)
Creatinine, Ser: 2.56 mg/dL — ABNORMAL HIGH (ref 0.50–1.35)
GFR calc Af Amer: 32 mL/min — ABNORMAL LOW (ref >90)
GFR calc non Af Amer: 27 mL/min — ABNORMAL LOW (ref >90)
Glucose, Bld: 95 mg/dL (ref 70–99)
Potassium: 4.5 mEq/L (ref 3.7–5.3)
Sodium: 142 mEq/L (ref 137–147)

## 2013-01-29 LAB — GLUCOSE, CAPILLARY
Glucose-Capillary: 79 mg/dL (ref 70–99)
Glucose-Capillary: 96 mg/dL (ref 70–99)
Glucose-Capillary: 173 mg/dL — ABNORMAL HIGH (ref 70–99)
Glucose-Capillary: 54 mg/dL — ABNORMAL LOW (ref 70–99)
Glucose-Capillary: 150 mg/dL — ABNORMAL HIGH (ref 70–99)

## 2013-01-29 MED ORDER — FUROSEMIDE 10 MG/ML IJ SOLN
20.0000 mg | Freq: Once | INTRAMUSCULAR | Status: AC
  Administered 2013-01-29: 20 mg via INTRAVENOUS
  Filled 2013-01-29 (×2): qty 2

## 2013-01-29 NOTE — Progress Notes (Signed)
Advanced Heart Failure Rounding Note  PCP: Dr. Carolann Littler  Primary Cardiologist: Dr. Ottie Glazier  Has seen nephrology in the past.  Reason for Admission: Hydration before L/RHC   Subjective:    Anthony Bullock is a 53 yo male with h/o hypertension, IDDM, hyperlipidemia, HTN, tobacco use (quit 2012,), COPD, CKD stage 3, peripheral neuropathy and newly diagnosed systolic HF.   Began to have HF symptoms in November 2014. He had ECHO 12/2012 which showed EF 25-30% with mod HK of entire anteroseptal wall, grade 1 DD, mod MR and mod TR. Due to CKD underwent Lexiscan myoview, which showed slight scar of apex and no significant ischemia.   Saw patient last week in the HF clinic after he had just been discharged from [facility] for ADHF. Still having complaints of orthopnea. Concern that given his risk factor and regional wall motion abnormalities that he has a severe ischemic cardiomyopathy. Was admitted yesterday for pre-hydration for planned [facility].  Cr above baseline on admission and cath was cancelled. Denies SOB, orthopnea or CP.   Creatinine 2.64>2.56  Objective:   Weight Range:  Vital Signs:   Temp:  [97.2 F (36.2 C)-98.1 F (36.7 C)] 97.7 F (36.5 C) (01/20 0730) Pulse Rate:  [89-95] 95 (01/20 0730) Resp:  [18] 18 (01/20 0547) BP: (115-130)/(57-73) 118/65 mmHg (01/20 0730) SpO2:  [98 %-100 %] 99 % (01/20 0730) Weight:  [194 lb 9.6 oz (88.27 kg)-196 lb 11.2 oz (89.223 kg)] 194 lb 9.6 oz (88.27 kg) (01/20 0547) Last BM Date: 01/28/13  Weight change: Filed Weights   01/28/13 1941 01/29/13 0547  Weight: 196 lb 11.2 oz (89.223 kg) 194 lb 9.6 oz (88.27 kg)    Intake/Output:   Intake/Output Summary (Last 24 hours) at 01/29/13 1017 Last data filed at 01/29/13 0548  Gross per 24 hour  Intake    340 ml  Output   1050 ml  Net   -710 ml     Physical Exam: General:  Well appearing. No resp difficulty; sitting in recliner HEENT: normal Neck: supple. JVP 7 . Carotids  2+ bilat; no bruits. No lymphadenopathy or thryomegaly appreciated. Cor: PMI nondisplaced. Regular rate & rhythm. No rubs, gallops or murmurs. Lungs: clear Abdomen: soft, nontender, nondistended. No hepatosplenomegaly. No bruits or masses. Good bowel sounds. Extremities: no cyanosis, clubbing, rash, edema Neuro: alert & orientedx3, cranial nerves grossly intact. moves all 4 extremities w/o difficulty. Affect pleasant  Telemetry: SR 80s  Labs: Basic Metabolic Panel:  Recent Labs Lab 01/28/13 2015 01/29/13 0410  NA 139 142  K 4.5 4.5  CL 101 106  CO2 27 26  GLUCOSE 117* 95  BUN 58* 56*  CREATININE 2.64* 2.56*  CALCIUM 8.7 8.6    Liver Function Tests: No results found for this basename: AST, ALT, ALKPHOS, BILITOT, PROT, ALBUMIN,  in the last 168 hours No results found for this basename: LIPASE, AMYLASE,  in the last 168 hours No results found for this basename: AMMONIA,  in the last 168 hours  CBC:  Recent Labs Lab 01/28/13 2015  WBC 7.2  HGB 12.2*  HCT 37.7*  MCV 77.4*  PLT 209    Cardiac Enzymes: No results found for this basename: CKTOTAL, CKMB, CKMBINDEX, TROPONINI,  in the last 168 hours  BNP: BNP (last 3 results)  Recent Labs  11/30/12 0941 01/16/13 0957  PROBNP 544.0* 560.0*    Imaging:  No results found.   Medications:     Scheduled Medications: . aspirin  81 mg Oral  Daily  . atorvastatin  40 mg Oral q1800  . carvedilol  6.25 mg Oral BID WC  . enoxaparin (LOVENOX) injection  40 mg Subcutaneous Q24H  . fenofibrate  160 mg Oral Daily  . gabapentin  300 mg Oral TID  . insulin aspart  0-15 Units Subcutaneous TID WC  . insulin aspart  14 Units Subcutaneous TID WC  . insulin glargine  50 Units Subcutaneous Daily  . sodium chloride  3 mL Intravenous Q12H  . sodium chloride  3 mL Intravenous Q12H     Infusions: . sodium chloride    . sodium chloride 75 mL/hr at 01/28/13 2240     PRN Medications:  sodium chloride, sodium  chloride   Assessment:   1) Chronic systolic HF  - EF 123XX123 (12/2012)  2) HTN  3) CKD, stage III  - baseline Cr 1.7-2.1  4) Abnormal stress myoview  5) Nicotine abuse  6) COPD  Plan/Discussion:    Admitted for planned L/RHC, however creatinine on admission was elevated above baseline 2.54 (baseline 2.3) and cath cancelled. Will continue to hydrate with IVF 75 cc/hr and follow Cr closely. Lasix was currently on hold may need start back 1/2 dose 20 mg daily so he does not get volume overloaded, will reassess in the am. May need nephroplogy consult. Will place for HF diet and cancel NPO orders. Continue to hold ACE-I.   OOB and ambulate in the halls.   Length of Stay: 1   Rande Brunt 01/29/2013, 10:17 AM  Advanced Heart Failure Team Pager [phone] (M-F; 7a - 4p)  Please contact [facility] for night-coverage after hours (4p -7a ) and weekends on [email or web address]  Patient seen and examined with Junie Bame, NP. We discussed all aspects of the encounter. I agree with the assessment and plan as stated above. Cath cancelled due to elevated creatinine. Continue IV hydration. Will give one dose IV lasix (20mg ) tonight to avoid pulmonary edema. Plan R/L cath tomorrow if Cr 2.1 or lower.   Daniel Bensimhon,MD 5:07 PM

## 2013-01-29 NOTE — Progress Notes (Signed)
UR completed Dameon Soltis K. Thad Osoria, RN, BSN, [place], CCM  01/29/2013 4:01 PM

## 2013-01-30 ENCOUNTER — Ambulatory Visit: Payer: BC Managed Care – PPO | Admitting: Cardiology

## 2013-01-30 ENCOUNTER — Encounter: Payer: Self-pay | Admitting: *Deleted

## 2013-01-30 ENCOUNTER — Encounter (HOSPITAL_COMMUNITY)
Admission: AD | Disposition: A | Discharge status: Home / Self Care | Payer: Self-pay | Source: Ambulatory Visit | Attending: Internal Medicine

## 2013-01-30 DIAGNOSIS — I2510 Atherosclerotic heart disease of native coronary artery without angina pectoris: Secondary | ICD-10-CM

## 2013-01-30 HISTORY — PX: LEFT AND RIGHT HEART CATHETERIZATION WITH CORONARY ANGIOGRAM: SHX5449

## 2013-01-30 HISTORY — PX: OTHER SURGICAL HISTORY: SHX169

## 2013-01-30 LAB — POCT I-STAT 3, VENOUS BLOOD GAS (G3P V)
Acid-base deficit: 2 mmol/L (ref 0.0–2.0)
Acid-base deficit: 4 mmol/L — ABNORMAL HIGH (ref 0.0–2.0)
Bicarbonate: 21.4 mEq/L (ref 20.0–24.0)
Bicarbonate: 23 mEq/L (ref 20.0–24.0)
O2 Saturation: 65 %
O2 Saturation: 68 %
TCO2: 23 mmol/L (ref 0–100)
TCO2: 24 mmol/L (ref 0–100)
pCO2, Ven: 40.2 mmHg — ABNORMAL LOW (ref 45.0–50.0)
pCO2, Ven: 40.9 mmHg — ABNORMAL LOW (ref 45.0–50.0)
pH, Ven: 7.335 — ABNORMAL HIGH (ref 7.250–7.300)
pH, Ven: 7.358 — ABNORMAL HIGH (ref 7.250–7.300)
pO2, Ven: 36 mmHg (ref 30.0–45.0)
pO2, Ven: 37 mmHg (ref 30.0–45.0)

## 2013-01-30 LAB — BASIC METABOLIC PANEL
BUN: 46 mg/dL — ABNORMAL HIGH (ref 6–23)
CO2: 23 mEq/L (ref 19–32)
Calcium: 8.2 mg/dL — ABNORMAL LOW (ref 8.4–10.5)
Chloride: 106 mEq/L (ref 96–112)
Creatinine, Ser: 2.04 mg/dL — ABNORMAL HIGH (ref 0.50–1.35)
GFR calc Af Amer: 42 mL/min — ABNORMAL LOW (ref >90)
GFR calc non Af Amer: 36 mL/min — ABNORMAL LOW (ref >90)
Glucose, Bld: 147 mg/dL — ABNORMAL HIGH (ref 70–99)
Potassium: 4.3 mEq/L (ref 3.7–5.3)
Sodium: 141 mEq/L (ref 137–147)

## 2013-01-30 LAB — POCT I-STAT 3, ART BLOOD GAS (G3+)
Acid-base deficit: 3 mmol/L — ABNORMAL HIGH (ref 0.0–2.0)
Bicarbonate: 20.4 mEq/L (ref 20.0–24.0)
O2 Saturation: 98 %
TCO2: 21 mmol/L (ref 0–100)
pCO2 arterial: 30.2 mmHg — ABNORMAL LOW (ref 35.0–45.0)
pH, Arterial: 7.437 (ref 7.350–7.450)
pO2, Arterial: 93 mmHg (ref 80.0–100.0)

## 2013-01-30 LAB — GLUCOSE, CAPILLARY
Glucose-Capillary: 141 mg/dL — ABNORMAL HIGH (ref 70–99)
Glucose-Capillary: 143 mg/dL — ABNORMAL HIGH (ref 70–99)
Glucose-Capillary: 126 mg/dL — ABNORMAL HIGH (ref 70–99)
Glucose-Capillary: 169 mg/dL — ABNORMAL HIGH (ref 70–99)
Glucose-Capillary: 96 mg/dL (ref 70–99)

## 2013-01-30 LAB — CBC
HCT: 33.9 % — ABNORMAL LOW (ref 39.0–52.0)
Hemoglobin: 10.9 g/dL — ABNORMAL LOW (ref 13.0–17.0)
MCH: 25.3 pg — ABNORMAL LOW (ref 26.0–34.0)
MCHC: 32.2 g/dL (ref 30.0–36.0)
MCV: 78.7 fL (ref 78.0–100.0)
Platelets: 179 10*3/uL (ref 150–400)
RBC: 4.31 MIL/uL (ref 4.22–5.81)
RDW: 14.6 % (ref 11.5–15.5)
WBC: 5.3 10*3/uL (ref 4.0–10.5)

## 2013-01-30 LAB — CREATININE, SERUM
Creatinine, Ser: 1.93 mg/dL — ABNORMAL HIGH (ref 0.50–1.35)
GFR calc Af Amer: 45 mL/min — ABNORMAL LOW (ref >90)
GFR calc non Af Amer: 39 mL/min — ABNORMAL LOW (ref >90)

## 2013-01-30 SURGERY — LEFT AND RIGHT HEART CATHETERIZATION WITH CORONARY ANGIOGRAM
Anesthesia: LOCAL

## 2013-01-30 MED ORDER — HEPARIN (PORCINE) IN NACL 2-0.9 UNIT/ML-% IJ SOLN
INTRAMUSCULAR | Status: AC
  Filled 2013-01-30: qty 1000

## 2013-01-30 MED ORDER — SODIUM CHLORIDE 0.9 % IV SOLN
INTRAVENOUS | Status: AC
  Administered 2013-01-30: 17:00:00 via INTRAVENOUS

## 2013-01-30 MED ORDER — NITROGLYCERIN 0.2 MG/ML ON CALL CATH LAB
INTRAVENOUS | Status: AC
  Filled 2013-01-30: qty 1

## 2013-01-30 MED ORDER — ENOXAPARIN SODIUM 40 MG/0.4ML ~~LOC~~ SOLN
40.0000 mg | SUBCUTANEOUS | Status: DC
  Administered 2013-01-31: 40 mg via SUBCUTANEOUS
  Filled 2013-01-30 (×2): qty 0.4

## 2013-01-30 MED ORDER — LIDOCAINE HCL (PF) 1 % IJ SOLN
INTRAMUSCULAR | Status: AC
  Filled 2013-01-30: qty 30

## 2013-01-30 MED ORDER — MIDAZOLAM HCL 2 MG/2ML IJ SOLN
INTRAMUSCULAR | Status: AC
  Filled 2013-01-30: qty 2

## 2013-01-30 MED ORDER — ACETAMINOPHEN 325 MG PO TABS: 650.0000 mg | ORAL_TABLET | ORAL | Status: DC | PRN

## 2013-01-30 MED ORDER — ONDANSETRON HCL 4 MG/2ML IJ SOLN: 4.0000 mg | Freq: Four times a day (QID) | INTRAMUSCULAR | Status: DC | PRN

## 2013-01-30 MED ORDER — FUROSEMIDE 10 MG/ML IJ SOLN
20.0000 mg | Freq: Once | INTRAMUSCULAR | Status: AC
  Administered 2013-01-30: 20 mg via INTRAVENOUS
  Filled 2013-01-30: qty 2

## 2013-01-30 MED ORDER — FENTANYL CITRATE 0.05 MG/ML IJ SOLN
INTRAMUSCULAR | Status: AC
  Filled 2013-01-30: qty 2

## 2013-01-30 NOTE — Progress Notes (Addendum)
Inpatient Diabetes Program Recommendations  AACE/ADA: New Consensus Statement on Inpatient Glycemic Control (2013)  Target Ranges:  Prepandial:   less than 140 mg/dL      Peak postprandial:   less than 180 mg/dL (1-2 hours)      Critically ill patients:  140 - 180 mg/dL   Reason for Visit: Uncontrolled DM  Results for KIROS, BISCHOFF (MRN [id number]) as of 01/30/2013 14:32  Ref. Range 01/29/2013 05:51 01/29/2013 11:40 01/29/2013 17:02 01/29/2013 20:53 01/29/2013 22:40 01/30/2013 06:29 01/30/2013 10:13 01/30/2013 11:26  Glucose-Capillary Latest Range: 70-99 mg/dL 79 96 173 (H) 54 (L) 150 (H) 141 (H) 143 (H) 126 (H)     Results for ACE, WORMAN (MRN [id number]) as of 01/30/2013 14:32  Ref. Range 11/30/2012 09:41  Hemoglobin A1C Latest Range: 4.6-6.5 % 8.6 (H)   Blood sugars acceptable in hospital.  HgbA1C indicates poor control at home. Would benefit from OP Diabetes Education consult at [facility]. Will order same. Will need f/u with PCP for glycemic control and adjustment of insulin.  Thank you. Lorenda Peck, RD, LDN, CDE Inpatient Diabetes Coordinator [phone]

## 2013-01-30 NOTE — Progress Notes (Signed)
1151m report received from RN cardiac cath

## 2013-01-30 NOTE — H&P (View-Only) (Signed)
Advanced Heart Failure Rounding Note  PCP: Dr. Carolann Littler  Primary Cardiologist: Dr. Ottie Glazier  Has seen nephrology in the past.  Reason for Admission: Hydration before L/RHC   Subjective:    Anthony Bullock is a 53 yo male with h/o hypertension, IDDM, hyperlipidemia, HTN, tobacco use (quit 2012,), COPD, CKD stage 3, peripheral neuropathy and newly diagnosed systolic HF.   Began to have HF symptoms in November 2014. He had ECHO 12/2012 which showed EF 25-30% with mod HK of entire anteroseptal wall, grade 1 DD, mod MR and mod TR. Due to CKD underwent Lexiscan myoview, which showed slight scar of apex and no significant ischemia.   Saw patient last week in the HF clinic after he had just been discharged from [facility] for ADHF. Still having complaints of orthopnea. Concern that given his risk factor and regional wall motion abnormalities that he has a severe ischemic cardiomyopathy. Was admitted yesterday for pre-hydration for planned [facility].  Cr above baseline on admission and cath was cancelled. Denies SOB, orthopnea or CP.   Creatinine 2.64>2.56  Objective:   Weight Range:  Vital Signs:   Temp:  [97.2 F (36.2 C)-98.1 F (36.7 C)] 97.7 F (36.5 C) (01/20 0730) Pulse Rate:  [89-95] 95 (01/20 0730) Resp:  [18] 18 (01/20 0547) BP: (115-130)/(57-73) 118/65 mmHg (01/20 0730) SpO2:  [98 %-100 %] 99 % (01/20 0730) Weight:  [194 lb 9.6 oz (88.27 kg)-196 lb 11.2 oz (89.223 kg)] 194 lb 9.6 oz (88.27 kg) (01/20 0547) Last BM Date: 01/28/13  Weight change: Filed Weights   01/28/13 1941 01/29/13 0547  Weight: 196 lb 11.2 oz (89.223 kg) 194 lb 9.6 oz (88.27 kg)    Intake/Output:   Intake/Output Summary (Last 24 hours) at 01/29/13 1017 Last data filed at 01/29/13 0548  Gross per 24 hour  Intake    340 ml  Output   1050 ml  Net   -710 ml     Physical Exam: General:  Well appearing. No resp difficulty; sitting in recliner HEENT: normal Neck: supple. JVP 7 . Carotids  2+ bilat; no bruits. No lymphadenopathy or thryomegaly appreciated. Cor: PMI nondisplaced. Regular rate & rhythm. No rubs, gallops or murmurs. Lungs: clear Abdomen: soft, nontender, nondistended. No hepatosplenomegaly. No bruits or masses. Good bowel sounds. Extremities: no cyanosis, clubbing, rash, edema Neuro: alert & orientedx3, cranial nerves grossly intact. moves all 4 extremities w/o difficulty. Affect pleasant  Telemetry: SR 80s  Labs: Basic Metabolic Panel:  Recent Labs Lab 01/28/13 2015 01/29/13 0410  NA 139 142  K 4.5 4.5  CL 101 106  CO2 27 26  GLUCOSE 117* 95  BUN 58* 56*  CREATININE 2.64* 2.56*  CALCIUM 8.7 8.6    Liver Function Tests: No results found for this basename: AST, ALT, ALKPHOS, BILITOT, PROT, ALBUMIN,  in the last 168 hours No results found for this basename: LIPASE, AMYLASE,  in the last 168 hours No results found for this basename: AMMONIA,  in the last 168 hours  CBC:  Recent Labs Lab 01/28/13 2015  WBC 7.2  HGB 12.2*  HCT 37.7*  MCV 77.4*  PLT 209    Cardiac Enzymes: No results found for this basename: CKTOTAL, CKMB, CKMBINDEX, TROPONINI,  in the last 168 hours  BNP: BNP (last 3 results)  Recent Labs  11/30/12 0941 01/16/13 0957  PROBNP 544.0* 560.0*    Imaging:  No results found.   Medications:     Scheduled Medications: . aspirin  81 mg Oral  Daily  . atorvastatin  40 mg Oral q1800  . carvedilol  6.25 mg Oral BID WC  . enoxaparin (LOVENOX) injection  40 mg Subcutaneous Q24H  . fenofibrate  160 mg Oral Daily  . gabapentin  300 mg Oral TID  . insulin aspart  0-15 Units Subcutaneous TID WC  . insulin aspart  14 Units Subcutaneous TID WC  . insulin glargine  50 Units Subcutaneous Daily  . sodium chloride  3 mL Intravenous Q12H  . sodium chloride  3 mL Intravenous Q12H     Infusions: . sodium chloride    . sodium chloride 75 mL/hr at 01/28/13 2240     PRN Medications:  sodium chloride, sodium  chloride   Assessment:   1) Chronic systolic HF  - EF 123XX123 (12/2012)  2) HTN  3) CKD, stage III  - baseline Cr 1.7-2.1  4) Abnormal stress myoview  5) Nicotine abuse  6) COPD  Plan/Discussion:    Admitted for planned L/RHC, however creatinine on admission was elevated above baseline 2.54 (baseline 2.3) and cath cancelled. Will continue to hydrate with IVF 75 cc/hr and follow Cr closely. Lasix was currently on hold may need start back 1/2 dose 20 mg daily so he does not get volume overloaded, will reassess in the am. May need nephroplogy consult. Will place for HF diet and cancel NPO orders. Continue to hold ACE-I.   OOB and ambulate in the halls.   Length of Stay: 1   Rande Brunt 01/29/2013, 10:17 AM  Advanced Heart Failure Team Pager [phone] (M-F; 7a - 4p)  Please contact [facility] for night-coverage after hours (4p -7a ) and weekends on [email or web address]  Patient seen and examined with Junie Bame, NP. We discussed all aspects of the encounter. I agree with the assessment and plan as stated above. Cath cancelled due to elevated creatinine. Continue IV hydration. Will give one dose IV lasix (20mg ) tonight to avoid pulmonary edema. Plan R/L cath tomorrow if Cr 2.1 or lower.   Emmanuela Ghazi,MD 5:07 PM

## 2013-01-30 NOTE — Progress Notes (Signed)
I, Taleya Whitcher A, RN cosign student RN Laura Caldwell's med administration, intake and output, assessment, etc. For this shift. 

## 2013-01-30 NOTE — Interval H&P Note (Signed)
History and Physical Interval Note:  01/30/2013 9:29 AM  [name]  has presented today for surgery, with the diagnosis of Heart failure  The various methods of treatment have been discussed with the patient and family. After consideration of risks, benefits and other options for treatment, the patient has consented to  Procedure(s): LEFT AND RIGHT HEART CATHETERIZATION WITH CORONARY ANGIOGRAM (N/A) and possible angioplasty Cath Lab Visit (complete for each Cath Lab visit)  Clinical Evaluation Leading to the Procedure:   ACS: no  Non-ACS:    Anginal Classification: CCS III  Anti-ischemic medical therapy: Minimal Therapy (1 class of medications)  Non-Invasive Test Results: Intermediate-risk stress test findings: cardiac mortality 1-3%/year  Prior CABG: No previous CABG      as a surgical intervention .  The patient's history has been reviewed, patient examined, no change in status, stable for surgery.  I have reviewed the patient's chart and labs.  Questions were answered to the patient's satisfaction.     Anthony Bullock

## 2013-01-30 NOTE — CV Procedure (Signed)
Cardiac Cath Procedure Note  Indication:   Procedures performed:  1) Right heart cathererization 2) Selective coronary angiography 3) Left heart catheterization  Description of procedure:     The risks and indication of the procedure were explained. Consent was signed and placed on the chart. An appropriate timeout was taken prior to the procedure. The right groin was prepped and draped in the routine sterile fashion and anesthetized with 1% local lidocaine.   A 5 FR arterial sheath was placed in the right femoral artery using a modified Seldinger technique. Standard catheters including a JL4, JR4 and angled pigtail were used. All catheter exchanges were made over a wire. A 7 FR venous sheath was placed in the right femoral vein using a modified Seldinger technique. A standard Swan-Ganz catheter was used for the procedure.   Complications:  None apparent  Contrast: 15-20cc   Findings:  RA = 6 RV = 48/5/7 PA =  47/22 (33) PCW = 20 Fick cardiac output/index = 5.2/2.5 PVR = 2.5 WU SVR = 1192 FA sat = 98% PA sat = 65%, 68%  Ao Pressure: 116/64 (84) LV Pressure: 122/13/18 There was no signficant gradient across the aortic valve on pullback.  Left main: Mild ostial tapering otherwise ok  LAD: Narrow vessel (due to diffuse diabetic vasculopathy). 30% ostial. 40-50% tubular lesion  in the mid section. 80% lesion distally. Large diagonal with moderate diffuse disease and 70% lesion in midsection  LCX: Non dominant vessel with diffuse diabetic vasculopathy. Large OM-1. Small OM-2 and OM-3. 2 PLs. 30% mid AV groove CX lesion. 40% lesion in proximal OM-1  RCA: Large dominant vessel with just mild plaque  Assessment: 1. CAD with diffuse diabetic vasculopathy. Only high grade lesion is in small distal LAD 2. Well-compensated hemodynamics  Plan/Discussion:  He has diffuse diabetic vasculopathy but TIMI-3 flow throughout. Suspect he has NICM. Continue medical therapy. Will hydrate  overnight and watch renal function. Home in am if stable.  Harith Mccadden,MD 10:04 AM

## 2013-01-30 NOTE — Interval H&P Note (Signed)
History and Physical Interval Note:  01/30/2013 9:29 AM  Anthony Bullock  has presented today for surgery, with the diagnosis of Heart failure  The various methods of treatment have been discussed with the patient and family. After consideration of risks, benefits and other options for treatment, the patient has consented to  Procedure(s): LEFT AND RIGHT HEART CATHETERIZATION WITH CORONARY ANGIOGRAM (N/A) and possible angioplasty as a surgical intervention .  The patient's history has been reviewed, patient examined, no change in status, stable for surgery.  I have reviewed the patient's chart and labs.  Questions were answered to the patient's satisfaction.     Daniel Bensimhon

## 2013-01-30 NOTE — Progress Notes (Signed)
Hypoglycemic Event  CBG: 54  Treatment: 15 GM carbohydrate snack  Symptoms: Shaky  Follow-up CBG: Time:22:40 CBG Result:150  Possible Reasons for Event: Unknown  Comments/MD notified: 50 units of lantus held this PM. Pt refused, pt NPO tonight for procedure. Pr provided with snack and drink tonight.     Anthony Bullock A  Remember to initiate Hypoglycemia Order Set & complete

## 2013-01-31 ENCOUNTER — Encounter (HOSPITAL_COMMUNITY): Payer: Self-pay | Admitting: Anesthesiology

## 2013-01-31 LAB — BASIC METABOLIC PANEL
BUN: 40 mg/dL — ABNORMAL HIGH (ref 6–23)
CO2: 20 mEq/L (ref 19–32)
Calcium: 8.2 mg/dL — ABNORMAL LOW (ref 8.4–10.5)
Chloride: 107 mEq/L (ref 96–112)
Creatinine, Ser: 1.87 mg/dL — ABNORMAL HIGH (ref 0.50–1.35)
GFR calc Af Amer: 46 mL/min — ABNORMAL LOW (ref >90)
GFR calc non Af Amer: 40 mL/min — ABNORMAL LOW (ref >90)
Glucose, Bld: 148 mg/dL — ABNORMAL HIGH (ref 70–99)
Potassium: 4.3 mEq/L (ref 3.7–5.3)
Sodium: 140 mEq/L (ref 137–147)

## 2013-01-31 LAB — GLUCOSE, CAPILLARY
Glucose-Capillary: 146 mg/dL — ABNORMAL HIGH (ref 70–99)
Glucose-Capillary: 164 mg/dL — ABNORMAL HIGH (ref 70–99)

## 2013-01-31 MED ORDER — ATORVASTATIN CALCIUM 80 MG PO TABS: 80.0000 mg | ORAL_TABLET | Freq: Every day | ORAL | Status: DC

## 2013-01-31 MED ORDER — FUROSEMIDE 40 MG PO TABS: 40.0000 mg | ORAL_TABLET | ORAL | Status: DC

## 2013-01-31 MED ORDER — CARVEDILOL 6.25 MG PO TABS: 6.2500 mg | ORAL_TABLET | Freq: Two times a day (BID) | ORAL | Status: DC

## 2013-01-31 NOTE — Progress Notes (Signed)
Advanced Heart Failure Rounding Note  PCP: Dr. Carolann Littler  Primary Cardiologist: Dr. Ottie Glazier  Has seen nephrology in the past.  Reason for Admission: Hydration before L/RHC   Subjective:    Anthony Bullock is a 53 yo male with h/o hypertension, IDDM, hyperlipidemia, HTN, tobacco use (quit 2012,), COPD, CKD stage 3, peripheral neuropathy and newly diagnosed systolic HF.   Began to have HF symptoms in November 2014. He had ECHO 12/2012 which showed EF 25-30% with mod HK of entire anteroseptal wall, grade 1 DD, mod MR and mod TR. Due to CKD underwent Lexiscan myoview, which showed slight scar of apex and no significant ischemia.   Patient seen in the HF clinic (1/14) after he had just been discharged from [facility] for ADHF. Still having complaints of orthopnea. Concern that given his risk factor and regional wall motion abnormalities that he has a severe ischemic cardiomyopathy. Was admitted for pre-hydration for planned [facility].  Taken for [facility] yesterday and showed CAD with diffuse diabetic vasculopathy with only high grade lesion in small distal LAD and well compensated hemodynamics. Cr stable 1.87. Denies SOB, orthopnea or CP.   Creatinine 2.64>2.56>2.04>1.87  Objective:   Weight Range:  Vital Signs:   Temp:  [97.4 F (36.3 C)-98.6 F (37 C)] 97.7 F (36.5 C) (01/22 0900) Pulse Rate:  [80-87] 80 (01/22 0900) Resp:  [18] 18 (01/22 0900) BP: (117-138)/(47-74) 126/60 mmHg (01/22 0900) SpO2:  [99 %-100 %] 100 % (01/22 0900) Weight:  [196 lb 9.6 oz (89.177 kg)] 196 lb 9.6 oz (89.177 kg) (01/22 0550) Last BM Date: 01/31/13  Weight change: Filed Weights   01/29/13 0547 01/30/13 0549 01/31/13 0550  Weight: 194 lb 9.6 oz (88.27 kg) 196 lb 6.9 oz (89.1 kg) 196 lb 9.6 oz (89.177 kg)    Intake/Output:   Intake/Output Summary (Last 24 hours) at 01/31/13 1020 Last data filed at 01/31/13 0944  Gross per 24 hour  Intake 3221.25 ml  Output   1675 ml  Net 1546.25 ml      Physical Exam: General:  Well appearing. No resp difficulty; sitting in recliner HEENT: normal Neck: supple. JVP 7 . Carotids 2+ bilat; no bruits. No lymphadenopathy or thryomegaly appreciated. Cor: PMI nondisplaced. Regular rate & rhythm. No rubs, gallops or murmurs. Lungs: clear Abdomen: soft, nontender, nondistended. No hepatosplenomegaly. No bruits or masses. Good bowel sounds. Extremities: no cyanosis, clubbing, rash, edema Neuro: alert & orientedx3, cranial nerves grossly intact. moves all 4 extremities w/o difficulty. Affect pleasant  Telemetry: SR 80s  Labs: Basic Metabolic Panel:  Recent Labs Lab 01/28/13 2015 01/29/13 0410 01/30/13 0624 01/30/13 1316 01/31/13 0305  NA 139 142 141  --  140  K 4.5 4.5 4.3  --  4.3  CL 101 106 106  --  107  CO2 27 26 23   --  20  GLUCOSE 117* 95 147*  --  148*  BUN 58* 56* 46*  --  40*  CREATININE 2.64* 2.56* 2.04* 1.93* 1.87*  CALCIUM 8.7 8.6 8.2*  --  8.2*    Liver Function Tests: No results found for this basename: AST, ALT, ALKPHOS, BILITOT, PROT, ALBUMIN,  in the last 168 hours No results found for this basename: LIPASE, AMYLASE,  in the last 168 hours No results found for this basename: AMMONIA,  in the last 168 hours  CBC:  Recent Labs Lab 01/28/13 2015 01/30/13 1316  WBC 7.2 5.3  HGB 12.2* 10.9*  HCT 37.7* 33.9*  MCV 77.4* 78.7  PLT 209 179    Cardiac Enzymes: No results found for this basename: CKTOTAL, CKMB, CKMBINDEX, TROPONINI,  in the last 168 hours  BNP: BNP (last 3 results)  Recent Labs  11/30/12 0941 01/16/13 0957  PROBNP 544.0* 560.0*    Imaging: No results found.   Medications:     Scheduled Medications: . aspirin  81 mg Oral Daily  . atorvastatin  40 mg Oral q1800  . carvedilol  6.25 mg Oral BID WC  . enoxaparin (LOVENOX) injection  40 mg Subcutaneous Q24H  . fenofibrate  160 mg Oral Daily  . gabapentin  300 mg Oral TID  . insulin aspart  0-15 Units Subcutaneous TID WC  .  insulin aspart  14 Units Subcutaneous TID WC  . insulin glargine  50 Units Subcutaneous Daily  . sodium chloride  3 mL Intravenous Q12H    Infusions: . sodium chloride    . sodium chloride 75 mL/hr at 01/31/13 0606    PRN Medications: acetaminophen, ondansetron (ZOFRAN) IV   Assessment:   1) Chronic systolic HF  - EF 123XX123 (12/2012)  2) HTN  3) CKD, stage III  - baseline Cr 1.7-2.1  4) Abnormal stress myoview  5) Nicotine abuse  6) COPD  Plan/Discussion:    Yesterday went for catheterization showing diffuse diabetic vasculopathy with well compensated hemodynamics. His volume status appears stable. Will discharge home today with close follow up in HF clinic next week. Will restart ACE-I ramipril 10 mg and increase atorvastatin to 80 mg daily.   Diabetes educator recommended outpatient DM education, however patient reports he can't afford and is 300$ visit with insurance. Offered to see if education could be done here if he would be interested and he declined would like to go home. Enforced need to follow up with PCP with blood sugar management.  D/C home today will cut lasix back to QOD. Already has appt with Dr. Meda Coffee next week and then we will see back in HF clinic in 1 month. Will need BMET next week.   Length of Stay: 3   Rande Brunt NP-C 01/31/2013, 10:20 AM  Advanced Heart Failure Team Pager [phone] (M-F; 7a - 4p)  Please contact [facility] for night-coverage after hours (4p -7a ) and weekends on [email or web address]   Patient seen and examined with Junie Bame, NP. We discussed all aspects of the encounter. I agree with the assessment and plan as stated above.   Renal function stable post cath. Weight stable. Ok to d/c home today. Would change lasix to every other day. Reinforced need for daily weights and reviewed use of sliding scale diuretics. He will f/u with Dr. Meda Coffee. The HF team is happy to continue to assist with his management.   Benay Spice 6:29 PM

## 2013-01-31 NOTE — Progress Notes (Signed)
Monitor discontinued. CCMD notified. Monitor cleaned and placed in appropriate cubby at nurses station.

## 2013-01-31 NOTE — Discharge Summary (Signed)
Advanced Heart Failure Team  Discharge Summary   Patient ID: Anthony Bullock MRN: [id number], DOB/AGE: 15-Jan-1960 53 y.o. Admit date: 01/28/2013 D/C date:     01/31/2013   Primary Discharge Diagnoses:  1) Abnormal stress myoview  Secondary Discharge Diagnoses:  1) Chronic systolic HF - EF 123XX123 (123456) 2) HTN 3) CKD stage III - baseline Cr 1.7-2.1 4) Hx of tobacco abuse 5) COPD 6) DM 7) CAD  Hospital Course:  Anthony Bullock is a 53 yo male with h/o hypertension, IDDM, hyperlipidemia, HTN, tobacco use (quit 2012,), COPD, CKD stage 3, peripheral neuropathy, and chronic systolic HF.   He was recently diagnosed with systolic HF with severe LV dysfunction. He was seen by Dr. Meda Coffee and had an outpatient [facility] outpatient that was abnormal with areas of infarct and mild ischemia. A cath was considered but deferred due to his renal dysfunction. He was referred to the HF clinic for evaluation and given his RFs, it was felt that he needed a cath to define his coronary anatomy and hemodynamics help plan treatment options.  He was admitted 01/28/13 for pre-hydration d/t his chronic renal failure. On admission his Cr was higher than his baseline at 2.64. His ACE-I was stopped and IV fluids were started for hydration. He received IV fluids at 75 mg/hr for over 24 hrs along with a one time dose of lasix 20 mg IV and his Cr improved to 2.04 and he was taken to the catheterization lab. The cath showed CAD with diffuse diabetic vasculopathy and well compensated hemodynamics.   On day of discharge he was complaining of no SOB, CP, orthopnea or edema and VSS. His ACE-I was started back at his previous dose and his lasix was cut back to 40 mg QOD. Also his atorvastatin was increased to 80 mg daily. Lengthy discussions took place with patient about his HF and education was provided to him on how to weigh daily, restrict his fluids to less than 2L and follow a low salt diet. Of note his Hgb AIC was 8.6 and it  was recommended that he could benefit from OP Diabetes Education, however he reported his insurance would not cover and he would follow up with his PCP.  He will follow up with Dr. Meda Coffee next week and then back in the HF clinic in 3 weeks.   R/LHC 01/30/13 RA = 6  RV = 48/5/7  PA = 47/22 (33)  PCW = 20  Fick cardiac output/index = 5.2/2.5  PVR = 2.5 WU  SVR = 1192  FA sat = 98%  PA sat = 65%, 68%  Ao Pressure: 116/64 (84)  LV Pressure: 122/13/18  There was no signficant gradient across the aortic valve on pullback.  Left main: Mild ostial tapering otherwise ok  LAD: Narrow vessel (due to diffuse diabetic vasculopathy). 30% ostial. 40-50% tubular lesion in the mid section. 80% lesion distally. Large diagonal with moderate diffuse disease and 70% lesion in midsection  LCX: Non dominant vessel with diffuse diabetic vasculopathy. Large OM-1. Small OM-2 and OM-3. 2 PLs. 30% mid AV groove CX lesion. 40% lesion in proximal OM-1  RCA: Large dominant vessel with just mild plaque    Discharge Weight Range: 194-196 lbs Discharge Vitals: Blood pressure 126/60, pulse 80, temperature 97.7 F (36.5 C), temperature source Oral, resp. rate 18, height 5\' 11"  (1.803 m), weight 196 lb 9.6 oz (89.177 kg), SpO2 100.00%.  Labs: Lab Results  Component Value Date   WBC 5.3 01/30/2013  HGB 10.9* 01/30/2013   HCT 33.9* 01/30/2013   MCV 78.7 01/30/2013   PLT 179 01/30/2013     Recent Labs Lab 01/31/13 0305  NA 140  K 4.3  CL 107  CO2 20  BUN 40*  CREATININE 1.87*  CALCIUM 8.2*  GLUCOSE 148*   Lab Results  Component Value Date   CHOL 165 11/30/2012   HDL 45.70 11/30/2012   LDLCALC 104* 11/30/2012   TRIG 77.0 11/30/2012   BNP (last 3 results)  Recent Labs  11/30/12 0941 01/16/13 0957  PROBNP 544.0* 560.0*    Diagnostic Studies/Procedures   No results found.  Discharge Medications     Medication List    STOP taking these medications       naproxen sodium 220 MG tablet   Commonly known as:  ANAPROX      TAKE these medications       ACCU-CHEK EASY TEST VI  by In Vitro route QID.     glucose blood test strip  Commonly known as:  ACCU-CHEK INSTANT PLUS TEST  Use as instructed daily     aspirin 81 MG tablet  Take 162 mg by mouth daily.     atorvastatin 80 MG tablet  Commonly known as:  LIPITOR  Take 1 tablet (80 mg total) by mouth at bedtime.     carvedilol 6.25 MG tablet  Commonly known as:  COREG  Take 1 tablet (6.25 mg total) by mouth 2 (two) times daily with a meal.     fenofibrate 160 MG tablet  Take 160 mg by mouth daily.     fexofenadine 180 MG tablet  Commonly known as:  ALLEGRA  Take 180 mg by mouth daily.     furosemide 40 MG tablet  Commonly known as:  LASIX  Take 1 tablet (40 mg total) by mouth every other day.     gabapentin 300 MG capsule  Commonly known as:  NEURONTIN  Take 1 capsule (300 mg total) by mouth 3 (three) times daily.     insulin glargine 100 UNIT/ML injection  Commonly known as:  LANTUS  50 Units daily. Use as directed daily     insulin lispro 100 UNIT/ML injection  Commonly known as:  HUMALOG  Inject 14 Units into the skin 3 (three) times daily with meals.     Insulin Pen Needle 32G X 4 MM Misc  Use three times daily with insulin     ramipril 10 MG capsule  Commonly known as:  ALTACE  Take 10 mg by mouth at bedtime.     saxagliptin HCl 5 MG Tabs tablet  Commonly known as:  ONGLYZA  Take 1 tablet (5 mg total) by mouth daily.        Disposition   The patient will be discharged in stable condition to home. Discharge Orders   Future Appointments Provider Department Dept Phone   02/07/2013 9:00 AM Anthony Spark, MD [place] Office [phone]   02/14/2013 2:20 PM [place] [phone]   Future Orders Complete By Expires   ACE Inhibitor / ARB already ordered  As directed    Ambulatory referral to Nutrition and  Diabetic Education  As directed    Beta Blocker already ordered  As directed    Diet - low sodium heart healthy  As directed    Heart Failure patients record your daily weight using the same scale at the same time of day  As directed    Increase activity slowly  As directed    STOP any activity that causes chest pain, shortness of breath, dizziness, sweating, or exessive weakness  As directed      Follow-up Information   Follow up with Anthony Spark, MD On 02/07/2013. (@ 9:20 am)    Specialty:  Cardiology   Contact information:   [address] [place] [address] [phone]       Follow up with Glori Bickers, MD On 02/14/2013. (@ 2:20 pm; gate code 0300)    Specialty:  Cardiology   Contact information:   [place] [address] [phone]         Duration of Discharge Encounter: Greater than 35 minutes   Signed, Rande Brunt  NP-C  01/31/2013, 3:24 PM  Patient seen and examined with Junie Bame, NP. We discussed all aspects of the encounter. I agree with the assessment and plan as stated above.  He is stable for d/c today. He will follow with Dr. Meda Coffee and the HF clinic.  Benay Spice 6:33 PM

## 2013-01-31 NOTE — Care Management Note (Addendum)
  Page 2 of 2   01/31/2013     10:24:11 AM   CARE MANAGEMENT NOTE 01/31/2013  Patient:  Anthony Bullock, Anthony Bullock   Account Number:  [id number]  Date Initiated:  01/31/2013  Documentation initiated by:  Iram Astorino  Subjective/Objective Assessment:   Admitted with CHF, Shortness of breath     Action/Plan:   CM Consult   Anticipated DC Date:  01/31/2013   Anticipated DC Plan:  [place]  CM consult      Choice offered to / List presented to:             Status of service:  Completed, signed off Medicare Important Message given?   (If response is "NO", the following Medicare IM given date fields will be blank) Date Medicare IM given:   Date Additional Medicare IM given:    Discharge Disposition:  HOME/SELF CARE  Per UR Regulation:  Reviewed for med. necessity/level of care/duration of stay  If discussed at [place] of Stay Meetings, dates discussed:    Comments:  01/31/2013 CM Consult: Social:  From Home with wife -  [place].   All ADLs self manged and active. Meds:  Self managed PCP:  Dr. Carolann Littler Cardiologist: Dr. Meda Coffee Home DME:  Kasandra Knudsen, digital scales -  (NO HOME o2) HHS:  None DM:  Patient keeps regular appts with MD's and weighs daily.  Reports s/s to PCP as needed.  Compliant with meds. No further CM needs identified at this time. Dispositon:  Home/Self Care. [address] RN, BSN, [place], CCM [phone] Unit) [phone] 01/31/2013

## 2013-01-31 NOTE — Progress Notes (Signed)
Patient taken out for discharge via wheelchair. Patients wife waiting to pick up patient at main entrance of hospital.

## 2013-01-31 NOTE — Progress Notes (Signed)
Home discharge instructions and d/c med papers given to pt. Pt to pick up prescriptions at [facility]. Follow up apopintments given. Patient able to use teachback without assistance on daily weights, low sodium diet and symptoms of heart failure exacerbation.

## 2013-02-01 LAB — GLUCOSE, CAPILLARY
Glucose-Capillary: 140 mg/dL — ABNORMAL HIGH (ref 70–99)
Glucose-Capillary: 146 mg/dL — ABNORMAL HIGH (ref 70–99)

## 2013-02-06 ENCOUNTER — Ambulatory Visit: Payer: BC Managed Care – PPO | Admitting: Cardiology

## 2013-02-07 ENCOUNTER — Ambulatory Visit (INDEPENDENT_AMBULATORY_CARE_PROVIDER_SITE_OTHER): Payer: BC Managed Care – PPO | Admitting: Cardiology

## 2013-02-07 ENCOUNTER — Encounter: Payer: Self-pay | Admitting: Cardiology

## 2013-02-07 VITALS — BP 110/60 | HR 85 | Ht 71.0 in | Wt 198.8 lb

## 2013-02-07 DIAGNOSIS — I10 Essential (primary) hypertension: Secondary | ICD-10-CM

## 2013-02-07 LAB — BASIC METABOLIC PANEL
BUN: 43 mg/dL — ABNORMAL HIGH (ref 6–23)
CO2: 28 mEq/L (ref 19–32)
Calcium: 8.8 mg/dL (ref 8.4–10.5)
Chloride: 105 mEq/L (ref 96–112)
Creatinine, Ser: 2.4 mg/dL — ABNORMAL HIGH (ref 0.4–1.5)
GFR: 30.01 mL/min — ABNORMAL LOW (ref >60.00)
Glucose, Bld: 51 mg/dL — ABNORMAL LOW (ref 70–99)
Potassium: 4.3 mEq/L (ref 3.5–5.1)
Sodium: 138 mEq/L (ref 135–145)

## 2013-02-07 NOTE — Patient Instructions (Signed)
Your physician recommends that you return for lab work in: today  Your physician recommends that you schedule a follow-up appointment in: 2 months

## 2013-02-07 NOTE — Progress Notes (Signed)
Patient ID: Anthony Bullock, male   DOB: February 23, 1960, 53 y.o.   MRN: [id number]     Patient Name: Anthony Bullock Date of Encounter: 02/07/2013  Primary Care Provider:  [name], MD Primary Cardiologist:  Ena Dawley, H  Problem List   Past Medical History  Diagnosis Date  . Allergic rhinitis   . Diabetes mellitus type II   . Hyperlipidemia   . Hypertension   . Urethral stricture   . Kidney stones   . Myocardial infarction     "stress test showed light heart attack" on 01/16/13  . Chronic systolic heart failure     a. ECHO (12/2012) EF 25-30%, HK entireanteroseptal myocardium b. Cath 01/31/13 RA 6, RV 48/5/7, PA 47/22 (33), PCW 20, Fick CO/CI 5.2/2.5, PVR 2.5   Past Surgical History  Procedure Laterality Date  . Reapea urethral surgery for recurrent obstruction  2011  . Total knee arthroplasty  2007  . Left a nd right heart cath  01/30/2013    DR BENSIHMON   Allergies  Allergies  Allergen Reactions  . Morphine Sulfate Rash    REACTION: Itches all over, red spots   HPI  Anthony Bullock is a 53 yo male with h/o hypertension, IDDM, hyperlipidemia, HTN, tobacco use (quit 2012,), COPD, CKD stage 3, peripheral neuropathy, and chronic systolic HF.   In December 2014 he was diagnosed with systolic HF with severe LV dysfunction. An outpatient Lexiscan myoview was abnormal with areas of infarct and mild ischemia. A cath was considered but deferred due to his renal dysfunction. He was referred to the HF clinic for evaluation and given his RFs, it was felt that he needed a cath to define his coronary anatomy and hemodynamics help plan treatment options. He was admitted 01/28/13 for pre-hydration d/t his chronic renal failure. On admission his Cr was higher than his baseline at 2.64. His ACE-I was stopped and IV fluids were started for hydration. He received IV fluids at 75 mg/hr for over 24 hrs along with a one time dose of lasix 20 mg IV and his Cr improved to 2.04 and he was taken to  the catheterization lab. The cath showed CAD with diffuse diabetic vasculopathy and well compensated hemodynamics.  His ACEI was restarted at discharge and  his atorvastatin was increased to 80 mg daily.  The patient is coming today and feels the best since he was diagnosed with CHF. He denies any chest pain, resting SOB, orthopnea, PND or LE edema. He gets SOB on moderate exertion while doing house chores.   Home Medications  Prior to Admission medications   Medication Sig Start Date End Date Taking? Authorizing Provider  aspirin 81 MG tablet Take 81 mg by mouth daily.     Yes Historical Provider, MD  fenofibrate 160 MG tablet TAKE 1 TABLET DAILY 05/23/12  Yes [name], MD  gabapentin (NEURONTIN) 300 MG capsule Take 1 capsule (300 mg total) by mouth 3 (three) times daily. 11/10/11  Yes [name], MD  Glucose Blood (ACCU-CHEK EASY TEST VI) by In Vitro route QID.   Yes Historical Provider, MD  glucose blood (ACCU-CHEK INSTANT PLUS TEST) test strip Use as instructed daily 05/02/11 12/25/12 Yes [name], MD  insulin glargine (LANTUS) 100 UNIT/ML injection 50 Units daily. Use as directed daily 02/10/12  Yes [name], MD  insulin lispro (HUMALOG) 100 UNIT/ML injection Inject 14 Units into the skin 3 (three) times daily with meals. 10/29/12 10/29/13 Yes Alinda Sierras  Burchette, MD  Insulin Pen Needle 32G X 4 MM MISC Use three times daily with insulin 11/10/11  Yes [name], MD  ramipril (ALTACE) 10 MG capsule TAKE 1 CAPSULE DAILY 05/23/12  Yes [name], MD  saxagliptin HCl (ONGLYZA) 5 MG TABS tablet Take 1 tablet (5 mg total) by mouth daily. 10/29/12  Yes [name], MD  sildenafil (VIAGRA) 100 MG tablet Take 50-100 mg by mouth daily as needed. 09/08/11 12/26/14 Yes [name], MD  simvastatin (ZOCOR) 80 MG tablet TAKE 1 TABLET AT BEDTIME 05/23/12  Yes [name], MD    Family History  Family History  Problem Relation Age of Onset  .  Alcohol abuse Other   . Heart disease Other   . Diabetes Other   . Stroke Other   . Anemia Sister     Social History  History   Social History  . Marital Status: Married    Spouse Name: N/A    Number of Children: N/A  . Years of Education: N/A   Occupational History  . Not on file.   Social History Main Topics  . Smoking status: Former Smoker -- 2.00 packs/day for 32 years    Types: Cigarettes    Quit date: 05/11/2010  . Smokeless tobacco: Not on file  . Alcohol Use: No  . Drug Use: No  . Sexual Activity: Not on file   Other Topics Concern  . Not on file   Social History Narrative  . No narrative on file     Review of Systems, as per HPI, otherwise negative General:  No chills, fever, night sweats or weight changes.  Cardiovascular:  No chest pain, dyspnea on exertion, edema, orthopnea, palpitations, paroxysmal nocturnal dyspnea. Dermatological: No rash, lesions/masses Respiratory: No cough, dyspnea Urologic: No hematuria, dysuria Abdominal:   No nausea, vomiting, diarrhea, bright red blood per rectum, melena, or hematemesis Neurologic:  No visual changes, wkns, changes in mental status. All other systems reviewed and are otherwise negative except as noted above.  Physical Exam  Blood pressure 110/60, pulse 85, height 5\' 11"  (1.803 m), weight 198 lb 12.8 oz (90.175 kg).  General: Pleasant, NAD Psych: Normal affect. Neuro: Alert and oriented X 3. Moves all extremities spontaneously. HEENT: Normal  Neck: Supple without bruits, no JVD. Lungs:  Resp regular and unlabored,CTA Heart: RRR no s3, s4, holosystolic murmur best heart at the apex. Abdomen: Soft, non-tender, non-distended, BS + x 4.  Extremities: No clubbing, cyanosis, no edema at the ankles. DP/PT/Radials 2+ and equal bilaterally.  Labs:  No results found for this basename: CKTOTAL, CKMB, TROPONINI,  in the last 72 hours Lab Results  Component Value Date   WBC 5.3 01/30/2013   HGB 10.9* 01/30/2013     HCT 33.9* 01/30/2013   MCV 78.7 01/30/2013   PLT 179 01/30/2013   No results found for this basename: NA, K, CL, CO2, BUN, CREATININE, CALCIUM, LABALBU, PROT, BILITOT, ALKPHOS, ALT, AST, GLUCOSE,  in the last 168 hours Lab Results  Component Value Date   CHOL 165 11/30/2012   HDL 45.70 11/30/2012   LDLCALC 104* 11/30/2012   TRIG 77.0 11/30/2012   BNP 544  Accessory Clinical Findings  ECG - SR, LAE, non-specific ST-T wave abnormalities  Echocardiogram: 12/12/2012  Left ventricle: The cavity size was severely dilated. Wall thickness was increased in a pattern of mild LVH. Systolic function was severely reduced. The estimated ejection fraction was in the range of 25% to 30%. Regional  wall motion abnormalities: There is moderate hypokinesis of the entireanteroseptal myocardium. Doppler parameters are consistent with abnormal left ventricular relaxation (grade 1 diastolic dysfunction). ------------------------------------------------------------ Aortic valve: Structurally normal valve. Trileaflet. Cusp separation was normal. Doppler: Transvalvular velocity was within the normal range. There was no stenosis. No regurgitation. Mitral valve: Mildly thickened leaflets . Doppler: Moderate regurgitation. ------------------------------------------------------------ Left atrium: The atrium was moderately dilated. ------------------------------------------------------------ Right ventricle: The cavity size was normal. Wall thickness was normal. Systolic function was normal. ------------------------------------------------------------ Pulmonic valve: Structurally normal valve. Cusp separation was normal. Doppler: Transvalvular velocity was within the normal range. No regurgitation. ------------------------------------------------------------ Tricuspid valve: Doppler: Moderate regurgitation. ------------------------------------------------------------ Right atrium: The atrium was normal  in size. ------------------------------------------------------------ Pericardium: The pericardium was normal in appearance.   Lexiscan nuclear stress test 01/15/2013 Impression  Exercise Capacity: Lexiscan with low level exercise.  BP Response: Normal blood pressure response.  Clinical Symptoms: There was shortness of breath when the treadmill was started. The patient was switched to [facility] with low level stress  ECG Impression: No significant ST segment change suggestive of ischemia.  Comparison with Prior Nuclear Study: No previous nuclear study performed  Overall Impression: There is significant left ventricular dysfunction. There may be slight scar at the apex. There is no significant ischemia.  LV Ejection Fraction: 27%. LV Wall Motion: Global hypokinesis. Significant left ventricular dilatation.    Assessment & Plan   A 53 year old male   1. Chronic systolic heart failure - LVEF 25-30%, catheterization showing diffuse diabetic vasculopathy with well compensated hemodynamics. The patient appears euvolemic at today's visit and feels well.  His volume status appears stable.  His today's labs show worsening Crea 1.87 at the discharge on 01/31/2013 and 2.4 today. We will decrease the dose od Ramipril to 5 mg po daily and repeat BMP the next week.   2. Hypertension - controlled today, he hasn't taken his meds yet, we will add coreg 6.25 mg po daily, he is advised not to take allegra -D as it contains sympatomimetics  3. IDDM, type 2, HbA1c high 8.6%, cant afford diabetic education class  4. Hyperlipidemia - HDL and TRIG at goal, LDL 104, goal <70, continue  atorvastatin 80 mg po daily, check liver enzymes the next week.  5. COPD - 64 pack year h/o smoking, smoking cessation counseling provided  Follow up the next week with HF clinic, in 2 months in our clinic.    Dorothy Spark, MD, [facility] 02/07/2013, 9:27 AM

## 2013-02-08 ENCOUNTER — Telehealth: Payer: Self-pay

## 2013-02-08 MED ORDER — RAMIPRIL 5 MG PO CAPS: 5.0000 mg | ORAL_CAPSULE | Freq: Every day | ORAL | Status: DC

## 2013-02-08 NOTE — Telephone Encounter (Signed)
Message copied by VIA, Deliah Boston on Fri Feb 08, 2013  5:21 PM ------      Message from: Dorothy Spark      Created: Thu Feb 07, 2013  5:34 PM       Jeani Hawking,      Would you call him and let him know that his creatinine was elevated and he should decrease ramipril to 5 mg po daily.      Thank you,      Houston Siren ------

## 2013-02-08 NOTE — Telephone Encounter (Signed)
The pt is advised, he verbalized understanding. RX sent to [facility] to fill per pt request.

## 2013-02-14 ENCOUNTER — Encounter (HOSPITAL_COMMUNITY): Payer: Self-pay

## 2013-02-14 ENCOUNTER — Ambulatory Visit (HOSPITAL_COMMUNITY)
Admission: RE | Admit: 2013-02-14 | Discharge: 2013-02-14 | Disposition: A | Discharge status: Home / Self Care | Payer: BC Managed Care – PPO | Source: Ambulatory Visit | Attending: Internal Medicine | Admitting: Internal Medicine

## 2013-02-14 VITALS — BP 114/70 | HR 84 | Ht 71.0 in | Wt 199.0 lb

## 2013-02-14 DIAGNOSIS — I129 Hypertensive chronic kidney disease with stage 1 through stage 4 chronic kidney disease, or unspecified chronic kidney disease: Secondary | ICD-10-CM | POA: Insufficient documentation

## 2013-02-14 DIAGNOSIS — Z87891 Personal history of nicotine dependence: Secondary | ICD-10-CM | POA: Insufficient documentation

## 2013-02-14 DIAGNOSIS — Z79899 Other long term (current) drug therapy: Secondary | ICD-10-CM | POA: Insufficient documentation

## 2013-02-14 DIAGNOSIS — I5022 Chronic systolic (congestive) heart failure: Secondary | ICD-10-CM

## 2013-02-14 DIAGNOSIS — E785 Hyperlipidemia, unspecified: Secondary | ICD-10-CM | POA: Insufficient documentation

## 2013-02-14 DIAGNOSIS — J4489 Other specified chronic obstructive pulmonary disease: Secondary | ICD-10-CM | POA: Insufficient documentation

## 2013-02-14 DIAGNOSIS — E1149 Type 2 diabetes mellitus with other diabetic neurological complication: Secondary | ICD-10-CM | POA: Insufficient documentation

## 2013-02-14 DIAGNOSIS — N183 Chronic kidney disease, stage 3 unspecified: Secondary | ICD-10-CM | POA: Insufficient documentation

## 2013-02-14 DIAGNOSIS — E1142 Type 2 diabetes mellitus with diabetic polyneuropathy: Secondary | ICD-10-CM | POA: Insufficient documentation

## 2013-02-14 DIAGNOSIS — J449 Chronic obstructive pulmonary disease, unspecified: Secondary | ICD-10-CM | POA: Insufficient documentation

## 2013-02-14 DIAGNOSIS — I252 Old myocardial infarction: Secondary | ICD-10-CM | POA: Insufficient documentation

## 2013-02-14 DIAGNOSIS — Z794 Long term (current) use of insulin: Secondary | ICD-10-CM | POA: Insufficient documentation

## 2013-02-14 MED ORDER — CARVEDILOL 6.25 MG PO TABS: 9.3750 mg | ORAL_TABLET | Freq: Two times a day (BID) | ORAL | Status: DC

## 2013-02-14 NOTE — Progress Notes (Signed)
Patient ID: Anthony Bullock, male   DOB: December 11, 1960, 54 y.o.   MRN: [id number]  Weight Range   Baseline proBNP     HPI: Anthony Bullock is a 53 yo male with h/o hypertension, IDDM, hyperlipidemia, HTN, tobacco use (quit 2012,), COPD, CKD stage 3, peripheral neuropathy, and chronic systolic HF EF 25% 12/2012.   Admitted to [facility] 1/19 through 01/31/13 due to new diagnosis of HF. Admitted for RHC/LHC due to abnormal Lexiscan Myoview. Prior to cath he received IV fluids due to elevated creatinine. Ace initially stopped but later restarted after he was hydrated. D/C weight 196 pounds.    R/LHC 01/30/13  RA = 6  RV = 48/5/7  PA = 47/22 (33)  PCW = 20  Fick cardiac output/index = 5.2/2.5  PVR = 2.5 WU  SVR = 1192  FA sat = 98%  PA sat = 65%, 68%  Ao Pressure: 116/64 (84)  LV Pressure: 122/13/18  There was no signficant gradient across the aortic valve on pullback.  Left main: Mild ostial tapering otherwise ok  LAD: Narrow vessel (due to diffuse diabetic vasculopathy). 30% ostial. 40-50% tubular lesion in the mid section. 80% lesion distally. Large diagonal with moderate diffuse disease and 70% lesion in midsection  LCX: Non dominant vessel with diffuse diabetic vasculopathy. Large OM-1. Small OM-2 and OM-3. 2 PLs. 30% mid AV groove CX lesion. 40% lesion in proximal OM-1  RCA: Large dominant vessel with just mild plaque   He returns for follow up. Saw Dr. Delton See last week and Cr was up to 2.4 (from 1.87). Ramipril decreased from 10mg  daily to 5mg . Feels very good. "Best I have felt in long time." No CP, weight very stable. No dyspnea. No dizziness. Taking lasix 40 qod. Hasn't had to take extra.     ROS: All systems negative except as listed in HPI, PMH and Problem List.  Past Medical History  Diagnosis Date  . Allergic rhinitis   . Diabetes mellitus type II   . Hyperlipidemia   . Hypertension   . Urethral stricture   . Kidney stones   . Myocardial infarction     "stress test showed light heart  attack" on 01/16/13  . Chronic systolic heart failure     a. ECHO (12/2012) EF 25-30%, HK entireanteroseptal myocardium b. Cath 01/31/13 RA 6, RV 48/5/7, PA 47/22 (33), PCW 20, Fick CO/CI 5.2/2.5, PVR 2.5    Current Outpatient Prescriptions  Medication Sig Dispense Refill  . aspirin 81 MG tablet Take 162 mg by mouth daily.       [name] atorvastatin (LIPITOR) 80 MG tablet Take 1 tablet (80 mg total) by mouth at bedtime.  30 tablet  3  . carvedilol (COREG) 6.25 MG tablet Take 1 tablet (6.25 mg total) by mouth 2 (two) times daily with a meal.  60 tablet  3  . fenofibrate 160 MG tablet Take 160 mg by mouth daily.      . fexofenadine (ALLEGRA) 180 MG tablet Take 180 mg by mouth daily.      . furosemide (LASIX) 40 MG tablet Take 1 tablet (40 mg total) by mouth every other day.  15 tablet  3  . gabapentin (NEURONTIN) 300 MG capsule Take 1 capsule (300 mg total) by mouth 3 (three) times daily.  270 capsule  3  . Glucose Blood (ACCU-CHEK EASY TEST VI) by In Vitro route QID.      [name] glucose blood (ACCU-CHEK INSTANT PLUS TEST) test strip Use as instructed daily  100 each  12  . insulin glargine (LANTUS) 100 UNIT/ML injection 50 Units daily. Use as directed daily      . insulin lispro (HUMALOG) 100 UNIT/ML injection Inject 14 Units into the skin 3 (three) times daily with meals.      . Insulin Pen Needle 32G X 4 MM MISC Use three times daily with insulin  300 each  3  . ramipril (ALTACE) 5 MG capsule Take 1 capsule (5 mg total) by mouth at bedtime.  30 capsule  6  . saxagliptin HCl (ONGLYZA) 5 MG TABS tablet Take 1 tablet (5 mg total) by mouth daily.  90 tablet  3   No current facility-administered medications for this encounter.     PHYSICAL EXAM: Filed Vitals:   02/14/13 1437  BP: 114/70  Pulse: 84  Height: 5\' 11"  (1.803 m)  Weight: 199 lb (90.266 kg)  SpO2: 100%    General:  Well appearing. No resp difficulty HEENT: normal Neck: supple. JVP flat. Carotids 2+ bilaterally; no bruits. No  lymphadenopathy or thryomegaly appreciated. Cor: PMI normal. Regular rate & rhythm. No rubs, gallops or murmurs. Lungs: clear Abdomen: soft, nontender, nondistended. No hepatosplenomegaly. No bruits or masses. Good bowel sounds. Extremities: no cyanosis, clubbing, rash, edema Neuro: alert & orientedx3, cranial nerves grossly intact. Moves all 4 extremities w/o difficulty. Affect pleasant.   ASSESSMENT & PLAN:  1. Chronic systolic heart failure - LVEF 40-98%, catheterization showing diffuse diabetic vasculopathy with well compensated hemodynamics.     --doing well NYHA I-II. Volume status looks good. Will increase carvedilol back to 9.375 bid. If can't tolerate can cut back. Will need echo at next visit to reassess EF and see if he needs ICD.  2. Hypertension - well controlled.  3. IDDM, type 2, HbA1c high 8.6%, cant afford diabetic education class  4. Hyperlipidemia - followed by Dr. Junie Bame Rayne Cowdrey,MD 3:01 PM

## 2013-02-14 NOTE — Patient Instructions (Signed)
Increase coreg to 9.375 mg (1.5 tablets) twice a day.  Follow up in clinic in 1 month with an ECHO.

## 2013-02-15 ENCOUNTER — Telehealth (HOSPITAL_COMMUNITY): Payer: Self-pay | Admitting: Cardiology

## 2013-02-15 DIAGNOSIS — I5022 Chronic systolic (congestive) heart failure: Secondary | ICD-10-CM

## 2013-02-15 NOTE — Telephone Encounter (Signed)
Pt will need to have labs recollected as 02/14/13 specimen was left in tubing station and unable to process. Pt unable to have labs done today, will come to [place] 02/18/13 and have labs drawn at [facility]

## 2013-02-18 ENCOUNTER — Other Ambulatory Visit (INDEPENDENT_AMBULATORY_CARE_PROVIDER_SITE_OTHER): Payer: BC Managed Care – PPO

## 2013-02-18 DIAGNOSIS — I5022 Chronic systolic (congestive) heart failure: Secondary | ICD-10-CM

## 2013-02-18 LAB — BASIC METABOLIC PANEL
BUN: 38 mg/dL — ABNORMAL HIGH (ref 6–23)
CO2: 26 mEq/L (ref 19–32)
Calcium: 8.3 mg/dL — ABNORMAL LOW (ref 8.4–10.5)
Chloride: 107 mEq/L (ref 96–112)
Creatinine, Ser: 2.2 mg/dL — ABNORMAL HIGH (ref 0.4–1.5)
GFR: 33.48 mL/min — ABNORMAL LOW (ref >60.00)
Glucose, Bld: 122 mg/dL — ABNORMAL HIGH (ref 70–99)
Potassium: 4.4 mEq/L (ref 3.5–5.1)
Sodium: 138 mEq/L (ref 135–145)

## 2013-03-12 NOTE — Addendum Note (Signed)
Encounter addended by: Scarlette Calico, RN on: 03/12/2013  3:39 PM<BR>     Documentation filed: Orders

## 2013-03-13 ENCOUNTER — Telehealth: Payer: Self-pay | Admitting: Cardiology

## 2013-03-13 ENCOUNTER — Ambulatory Visit (HOSPITAL_COMMUNITY)
Admission: RE | Admit: 2013-03-13 | Discharge: 2013-03-13 | Disposition: A | Discharge status: Home / Self Care | Payer: BC Managed Care – PPO | Source: Ambulatory Visit | Attending: Family Medicine | Admitting: Family Medicine

## 2013-03-13 ENCOUNTER — Telehealth: Payer: Self-pay | Admitting: Family Medicine

## 2013-03-13 ENCOUNTER — Ambulatory Visit (HOSPITAL_BASED_OUTPATIENT_CLINIC_OR_DEPARTMENT_OTHER)
Admission: RE | Admit: 2013-03-13 | Discharge: 2013-03-13 | Disposition: A | Discharge status: Home / Self Care | Payer: BC Managed Care – PPO | Source: Ambulatory Visit | Attending: Internal Medicine | Admitting: Internal Medicine

## 2013-03-13 VITALS — BP 116/68 | HR 81 | Wt 199.5 lb

## 2013-03-13 DIAGNOSIS — I517 Cardiomegaly: Secondary | ICD-10-CM

## 2013-03-13 DIAGNOSIS — E785 Hyperlipidemia, unspecified: Secondary | ICD-10-CM

## 2013-03-13 DIAGNOSIS — I509 Heart failure, unspecified: Secondary | ICD-10-CM | POA: Insufficient documentation

## 2013-03-13 DIAGNOSIS — N183 Chronic kidney disease, stage 3 unspecified: Secondary | ICD-10-CM

## 2013-03-13 DIAGNOSIS — I5022 Chronic systolic (congestive) heart failure: Secondary | ICD-10-CM

## 2013-03-13 DIAGNOSIS — I2510 Atherosclerotic heart disease of native coronary artery without angina pectoris: Secondary | ICD-10-CM

## 2013-03-13 LAB — BASIC METABOLIC PANEL
BUN: 43 mg/dL — ABNORMAL HIGH (ref 6–23)
CO2: 25 mEq/L (ref 19–32)
Calcium: 9.4 mg/dL (ref 8.4–10.5)
Chloride: 106 mEq/L (ref 96–112)
Creatinine, Ser: 2.06 mg/dL — ABNORMAL HIGH (ref 0.50–1.35)
GFR calc Af Amer: 41 mL/min — ABNORMAL LOW (ref >90)
GFR calc non Af Amer: 36 mL/min — ABNORMAL LOW (ref >90)
Glucose, Bld: 85 mg/dL (ref 70–99)
Potassium: 5.2 mEq/L (ref 3.7–5.3)
Sodium: 142 mEq/L (ref 137–147)

## 2013-03-13 MED ORDER — GLUCOSE BLOOD VI STRP: ORAL_STRIP | Status: DC

## 2013-03-13 MED ORDER — CARVEDILOL 12.5 MG PO TABS: 12.5000 mg | ORAL_TABLET | Freq: Two times a day (BID) | ORAL | Status: DC

## 2013-03-13 NOTE — Patient Instructions (Signed)
Increase Carvedilol to 12.5 mg Twice daily   Lab today  You have been referred to EP  We will contact you in 2 months to schedule your next appointment.

## 2013-03-13 NOTE — Addendum Note (Signed)
Addended by: Marcina Millard on: 03/13/2013 01:34 PM   Modules accepted: Orders

## 2013-03-13 NOTE — Progress Notes (Signed)
Patient ID: Anthony Bullock, male   DOB: [birth year], 53 y.o.   MRN: [id number] PCP: Dr. Elease Hashimoto  53 yo with history of diabetes, diabetic nephropathy, CAD, and primarily nonischemic cardiomyopathy presents for cardiology followup. Had had LHC in 1/15 with some CAD but no interventional target (looks like diabetic vascular disease).  Initial echo in 12/14 with EF 25-30%.  I reviewed his echo today: EF remains low at 25%. Weight is down 10 lbs since last appointment.  After cath, creatinine went up to 2.4, most recently back to 2.2.  Ramipril was decreased to 5 mg daily.  He denies exertional dyspnea or chest pain.  He can climb a flight of steps without problems.  No orthopnea (improved).    Labs (2/15): K 4.4, creatinine 2.4 => 2.2   ECG: NSR, normal (QRS not widened)  PMH: 1. Cardiomyopathy: Primarily nonischemic.  Echo (12/14) with EF 25-30%.  Echo (3/15) with EF 25%, mild to moderately dilated LV, normal RV size and systolic function.  RHC/LHC (1/15) with mean RA 6, PA 47/22 mean 33, mean PCWP 20, PVR 2.5 WU, CI 2.5; 80% dLAD stenosis, 70% diffuse large D.  2. CAD: LHC (1/15) with 80% dLAD, 70% diffuse large diagonal.   3. 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  4. CKD stage III: Likely related to diabetes.  5. Hyperlipidemia.   FH: No premature CAD, no SCD.   SH: Married, lives in [place]. Works at [employer].  No longer smokes, quit 2012 smoked for 36 years.  ROS: All systems reviewed and negative except as per HPI.   Current Outpatient Prescriptions  Medication Sig Dispense Refill  . aspirin 81 MG tablet Take 162 mg by mouth daily.       [name] atorvastatin (LIPITOR) 80 MG tablet Take 1 tablet (80 mg total) by mouth at bedtime.  30 tablet  3  . carvedilol (COREG) 12.5 MG tablet Take 1 tablet (12.5 mg total) by mouth 2 (two) times daily with a meal.  60 tablet  3  . fenofibrate 160 MG tablet Take 160 mg by mouth daily.      . fexofenadine (ALLEGRA) 180 MG tablet  Take 180 mg by mouth daily.      . furosemide (LASIX) 40 MG tablet Take 1 tablet (40 mg total) by mouth every other day.  15 tablet  3  . gabapentin (NEURONTIN) 300 MG capsule Take 1 capsule (300 mg total) by mouth 3 (three) times daily.  270 capsule  3  . Glucose Blood (ACCU-CHEK EASY TEST VI) by In Vitro route QID.      [name] insulin glargine (LANTUS) 100 UNIT/ML injection 50 Units daily. Use as directed daily      . insulin lispro (HUMALOG) 100 UNIT/ML injection Inject 14 Units into the skin 3 (three) times daily with meals as needed.       . Insulin Pen Needle 32G X 4 MM MISC Use three times daily with insulin  300 each  3  . ramipril (ALTACE) 5 MG capsule Take 1 capsule (5 mg total) by mouth at bedtime.  30 capsule  6  . saxagliptin HCl (ONGLYZA) 5 MG TABS tablet Take 1 tablet (5 mg total) by mouth daily.  90 tablet  3  . glucose blood (ACCU-CHEK INSTANT PLUS TEST) test strip Use as instructed daily  100 each  12  . glucose blood (ONE TOUCH ULTRA TEST) test strip Use as instructed. DX: 250.00  100 each  5  No current facility-administered medications for this encounter.   BP 116/68  Pulse 81  Wt 199 lb 8 oz (90.493 kg)  SpO2 99% General: NAD Neck: No JVD, no thyromegaly or thyroid nodule.  Lungs: Clear to auscultation bilaterally with normal respiratory effort. CV: Nondisplaced PMI.  Heart regular S1/S2, no S3/S4, no murmur.  No peripheral edema.  No carotid bruit.  Normal pedal pulses.  Abdomen: Soft, nontender, no hepatosplenomegaly, no distention.  Skin: Intact without lesions or rashes.  Neurologic: Alert and oriented x 3.  Psych: Normal affect. Extremities: No clubbing or cyanosis.   Assessment/Plan: 1. Chronic systolic CHF: Primarily nonischemic cardiomyopathy.  EF remains 25% (I reviewed today's echo).  NYHA class II symptoms at this point.  He looks euvolemic.  - Given persistently decreased EF despite medical therapy, will refer for ICD.  He is not a candidate for CRT given  narrow QRS.  - Increase Coreg to 12.5 mg bid.  - Keep ramipril at 5 mg daily given recent rise in creatinine.  - Continue Lasix qod.  - Repeat BMET today.  - Would hold off on cardiac MRI as we would not be able to give him contrast.   2. CAD: Moderate CAD on cath, unlikely that this is the cause of cardiomyopathy.  Continue atorvastatin and ASA 81.  3. Hyperlipidemia: Goal LDL < 70.  4. CKD: Stage III.  Creatinine rose after cath. As above, will repeat BMET today.   Loralie Champagne 03/13/2013 3:00 PM

## 2013-03-13 NOTE — Telephone Encounter (Signed)
Sent RX to pharmacy 

## 2013-03-13 NOTE — Telephone Encounter (Signed)
New message     Need plan of care on patient following heart cath

## 2013-03-13 NOTE — Telephone Encounter (Signed)
[facility] requesting new script for ONE TOUCH ULTRA [place]

## 2013-03-13 NOTE — Telephone Encounter (Signed)
Left message for Melody to call office and leave her fax number so I can fax needed information.

## 2013-03-13 NOTE — Telephone Encounter (Signed)
Follow up     Her fax number is [phone]   Attn melody

## 2013-03-13 NOTE — Progress Notes (Signed)
  Echocardiogram 2D Echocardiogram has been performed.  Anthony Bullock 03/13/2013, 9:42 AM

## 2013-03-14 NOTE — Telephone Encounter (Signed)
**Note De-Identified  Obfuscation** FYI: I faxed the pt OV notes from last OV with Dr. Meda Coffee. The pt will not be followed by Dr Meda Coffee anymore as he is now seen by Dr Aundra Dubin due to CHF.

## 2013-03-15 ENCOUNTER — Telehealth (HOSPITAL_COMMUNITY): Payer: Self-pay

## 2013-03-15 NOTE — Telephone Encounter (Signed)
Patient called to inform of lab results, confirms he is not taking any supplemental K.  Instructed on low K diet, gave some resources over the phone.  Asked to call us with any questions.

## 2013-03-21 ENCOUNTER — Encounter: Payer: Self-pay | Admitting: *Deleted

## 2013-03-21 ENCOUNTER — Ambulatory Visit (INDEPENDENT_AMBULATORY_CARE_PROVIDER_SITE_OTHER): Payer: BC Managed Care – PPO | Admitting: Internal Medicine

## 2013-03-21 ENCOUNTER — Encounter: Payer: Self-pay | Admitting: Internal Medicine

## 2013-03-21 VITALS — BP 136/70 | HR 79 | Ht 71.0 in | Wt 202.0 lb

## 2013-03-21 DIAGNOSIS — I509 Heart failure, unspecified: Secondary | ICD-10-CM

## 2013-03-21 DIAGNOSIS — I2510 Atherosclerotic heart disease of native coronary artery without angina pectoris: Secondary | ICD-10-CM

## 2013-03-21 DIAGNOSIS — I10 Essential (primary) hypertension: Secondary | ICD-10-CM

## 2013-03-21 DIAGNOSIS — I5022 Chronic systolic (congestive) heart failure: Secondary | ICD-10-CM

## 2013-03-21 NOTE — Progress Notes (Signed)
Primary Care Physician: Anthony Post, MD Referring Physician: Drayce Hollings is a 53 y.o. male with a h/o non ischemic cardiomyopathy (EF 25%), moderate CAD on cath 12/2012 but medical therapy recommended, type II diabetes, stage III CKD, and hyperlipidemia.  He was first diagnosed with cardiomyopathy in December of 2014.  He has been maintained on optimal medical therapy since that time.  Repeat echo 03/2013 demonstrated no improvement in EF.  Further medical therapy has been limited by renal failure and blood pressure.  He remains active and continues to work.  He reports SOB with moderate activity but has received significant clinical benefit with medical therapy.  Today, he denies symptoms of palpitations, chest pain, orthopnea, PND, lower extremity edema, dizziness, presyncope, syncope, or neurologic sequela. The patient is tolerating medications without difficulties and is otherwise without complaint today.   Past Medical History  Diagnosis Date  . Allergic rhinitis   . Diabetes mellitus type II   . Hyperlipidemia   . Hypertension   . Urethral stricture   . Kidney stones   . Myocardial infarction     "stress test showed light heart attack" on 01/16/13  . Chronic systolic heart failure     a. ECHO (12/2012) EF 25-30%, HK entireanteroseptal myocardium b. Cath 01/31/13 RA 6, RV 48/5/7, PA 47/22 (33), PCW 20, Fick CO/CI 5.2/2.5, PVR 2.5   Past Surgical History  Procedure Laterality Date  . Reapea urethral surgery for recurrent obstruction  2011  . Total knee arthroplasty  2007  . Left a nd right heart cath  01/30/2013    DR BENSIHMON    Current Outpatient Prescriptions  Medication Sig Dispense Refill  . aspirin 81 MG tablet Take 162 mg by mouth daily.       [name] atorvastatin (LIPITOR) 80 MG tablet Take 1 tablet (80 mg total) by mouth at bedtime.  30 tablet  3  . carvedilol (COREG) 25 MG tablet Take 25 mg by mouth 2 (two) times daily with a meal.      . fenofibrate 160 MG  tablet Take 160 mg by mouth daily.      . fexofenadine (ALLEGRA) 180 MG tablet Take 180 mg by mouth daily.      . furosemide (LASIX) 40 MG tablet Take 1 tablet (40 mg total) by mouth every other day.  15 tablet  3  . gabapentin (NEURONTIN) 300 MG capsule Take 1 capsule (300 mg total) by mouth 3 (three) times daily.  270 capsule  3  . insulin glargine (LANTUS) 100 UNIT/ML injection 50 Units daily. Use as directed daily      . insulin lispro (HUMALOG) 100 UNIT/ML injection Inject 14 Units into the skin 3 (three) times daily with meals as needed.       . ramipril (ALTACE) 5 MG capsule Take 1 capsule (5 mg total) by mouth at bedtime.  30 capsule  6  . saxagliptin HCl (ONGLYZA) 5 MG TABS tablet Take 1 tablet (5 mg total) by mouth daily.  90 tablet  3   No current facility-administered medications for this visit.    Allergies  Allergen Reactions  . Morphine Sulfate Rash    REACTION: Itches all over, red spots    History   Social History  . Marital Status: Married    Spouse Name: N/A    Number of Children: N/A  . Years of Education: N/A   Occupational History  . Not on file.   Social History Main Topics  .  Smoking status: Former Smoker -- 2.00 packs/day for 32 years    Types: Cigarettes    Quit date: 05/11/2010  . Smokeless tobacco: Not on file  . Alcohol Use: No  . Drug Use: No  . Sexual Activity: Not on file   Other Topics Concern  . Not on file   Social History Narrative  . No narrative on file    Family History  Problem Relation Age of Onset  . Alcohol abuse Other   . Heart disease Other   . Diabetes Other   . Stroke Other   . Anemia Sister     ROS- All systems are reviewed and negative except as per the HPI above  Physical Exam: Filed Vitals:   03/21/13 0838  BP: 136/70  Pulse: 79  Height: 5\' 11"  (1.803 m)  Weight: 202 lb (91.627 kg)    GEN- The patient is well appearing, alert and oriented x 3 today.   Head- normocephalic, atraumatic Eyes-  Sclera  clear, conjunctiva pink Ears- hearing intact Oropharynx- clear Neck- supple, no JVP Lymph- no cervical lymphadenopathy Lungs- Clear to ausculation bilaterally, normal work of breathing Heart- Regular rate and rhythm, no murmurs, rubs or gallops, PMI not laterally displaced GI- soft, NT, ND, + BS Extremities- no clubbing, cyanosis, or edema MS- no significant deformity or atrophy Skin- no rash or lesion Psych- euthymic mood, full affect Neuro- strength and sensation are intact  EKG- reviewed from 01/2013  Assessment and Plan:  The patient has a no ischemic CM (EF 25), NYHA Class II/III CHF, and CAD.  He has been treated with an optimal medical therapy for at least 3 months without improvement.  He has a narrow complex QRS as would not be expected to benefit from CRT.  At this time, he meets SCD-HeFT criteria for ICD implantation for primary prevention of sudden death.  Risks, benefits, alternatives to ICD implantation were discussed in detail with the patient today. The patient  understands that the risks include but are not limited to bleeding, infection, pneumothorax, perforation, tamponade, vascular damage, renal failure, MI, stroke, death, inappropriate shocks, and lead dislodgement and wishes to proceed.  We will therefore schedule device implantation at the next available time.

## 2013-03-21 NOTE — Patient Instructions (Signed)
Your physician has recommended that you have a defibrillator inserted. An implantable cardioverter defibrillator (ICD) is a small device that is placed in your chest or, in rare cases, your abdomen. This device uses electrical pulses or shocks to help control life-threatening, irregular heartbeats that could lead the heart to suddenly stop beating (sudden cardiac arrest). Leads are attached to the ICD that goes into your heart. This is done in the hospital and usually requires an overnight stay. Please see the instruction sheet given to you today for more information.  See instruction sheet 

## 2013-03-26 ENCOUNTER — Other Ambulatory Visit: Payer: Self-pay | Admitting: *Deleted

## 2013-04-02 ENCOUNTER — Ambulatory Visit (INDEPENDENT_AMBULATORY_CARE_PROVIDER_SITE_OTHER): Payer: BC Managed Care – PPO | Admitting: Cardiology

## 2013-04-02 ENCOUNTER — Encounter: Payer: Self-pay | Admitting: Cardiology

## 2013-04-02 VITALS — BP 110/60 | HR 76 | Ht 71.0 in | Wt 206.8 lb

## 2013-04-02 DIAGNOSIS — I509 Heart failure, unspecified: Secondary | ICD-10-CM

## 2013-04-02 MED ORDER — ATORVASTATIN CALCIUM 80 MG PO TABS: 80.0000 mg | ORAL_TABLET | Freq: Every day | ORAL | Status: DC

## 2013-04-02 MED ORDER — FUROSEMIDE 40 MG PO TABS: 40.0000 mg | ORAL_TABLET | ORAL | Status: DC

## 2013-04-02 MED ORDER — RAMIPRIL 5 MG PO CAPS: 5.0000 mg | ORAL_CAPSULE | Freq: Every day | ORAL | Status: DC

## 2013-04-02 NOTE — Patient Instructions (Addendum)
Your physician recommends that you return for lab work a few days before 2 month follow up for CMET.   Your physician recommends that you schedule a follow-up appointment in: 2 months with Dr. Meda Coffee.

## 2013-04-02 NOTE — Progress Notes (Signed)
Patient ID: Anthony Bullock, male   DOB: [birth year], 53 y.o.   MRN: [id number]   PCP: Dr. Elease Hashimoto  53 yo with history of diabetes, diabetic nephropathy, CAD, and primarily nonischemic cardiomyopathy presents for cardiology followup. Had had LHC in 1/15 with some CAD but no interventional target (looks like diabetic vascular disease).  Initial echo in 12/14 with EF 25-30%.  I reviewed his echo today: EF remains low at 25%. Weight is down 10 lbs since last appointment.  After cath, creatinine went up to 2.4, most recently back to 2.0.  Ramipril was decreased to 5 mg daily.  He denies exertional dyspnea or chest pain.  He can climb a flight of steps without problems.  No orthopnea (improved), no PND, no LE edema.  He continues to work with limited weight lifting to 30 pounds and has no problem doing that.  He was seen by Dr. Rayann Heman and is scheduled for an ICD implantation in April.  ECG: NSR, normal (QRS not widened)  PMH: 1. Cardiomyopathy: Primarily nonischemic.  Echo (12/14) with EF 25-30%.  Echo (3/15) with EF 25%, mild to moderately dilated LV, normal RV size and systolic function.  RHC/LHC (1/15) with mean RA 6, PA 47/22 mean 33, mean PCWP 20, PVR 2.5 WU, CI 2.5; 80% dLAD stenosis, 70% diffuse large D.  2. CAD: LHC (1/15) with 80% dLAD, 70% diffuse large diagonal.   3. 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  4. CKD stage III: Likely related to diabetes.  5. Hyperlipidemia.   FH: No premature CAD, no SCD.   SH: Married, lives in [place]. Works at [employer].  No longer smokes, quit 2012 smoked for 36 years.  ROS: All systems reviewed and negative except as per HPI.   Current Outpatient Prescriptions  Medication Sig Dispense Refill  . aspirin 81 MG tablet Take 162 mg by mouth daily.       [name] atorvastatin (LIPITOR) 80 MG tablet Take 1 tablet (80 mg total) by mouth at bedtime.  30 tablet  3  . carvedilol (COREG) 25 MG tablet Take 25 mg by mouth 2 (two) times daily  with a meal.      . fenofibrate 160 MG tablet Take 160 mg by mouth daily.      . fexofenadine (ALLEGRA) 180 MG tablet Take 180 mg by mouth daily.      . furosemide (LASIX) 40 MG tablet Take 1 tablet (40 mg total) by mouth every other day.  15 tablet  3  . gabapentin (NEURONTIN) 300 MG capsule Take 1 capsule (300 mg total) by mouth 3 (three) times daily.  270 capsule  3  . insulin glargine (LANTUS) 100 UNIT/ML injection 50 Units daily. Use as directed daily      . insulin lispro (HUMALOG) 100 UNIT/ML injection Inject 14 Units into the skin 3 (three) times daily with meals as needed.       . ONE TOUCH ULTRA TEST test strip as directed.      . ramipril (ALTACE) 5 MG capsule Take 1 capsule (5 mg total) by mouth at bedtime.  30 capsule  6  . saxagliptin HCl (ONGLYZA) 5 MG TABS tablet Take 1 tablet (5 mg total) by mouth daily.  90 tablet  3   No current facility-administered medications for this visit.   BP 110/60  Pulse 76  Ht 5\' 11"  (1.803 m)  Wt 206 lb 12.8 oz (93.804 kg)  BMI 28.86 kg/m2 General: NAD Neck: No JVD, no  thyromegaly or thyroid nodule.  Lungs: Clear to auscultation bilaterally with normal respiratory effort. CV: Nondisplaced PMI.  Heart regular S1/S2, no S3/S4, no murmur.  No peripheral edema.  No carotid bruit.  Normal pedal pulses.  Abdomen: Soft, nontender, no hepatosplenomegaly, no distention.  Skin: Intact without lesions or rashes.  Neurologic: Alert and oriented x 3.  Psych: Normal affect. Extremities: No clubbing or cyanosis.   Assessment/Plan:  1. Chronic systolic CHF: Primarily nonischemic cardiomyopathy.  EF remains 25% (I reviewed today's echo).  NYHA class II symptoms at this point.  He looks euvolemic despite gaining 7 pounds since the last visit. He is educated about when necessary use of Lasix in case his leg pain continues. - Given persistently decreased EF despite medical therapy, he is scheduled for an ICD implantation in April.[name]  He is not a candidate for  CRT given narrow QRS.  - Continue Coreg to 12.5 mg bid.  - Keep ramipril at 5 mg daily given recent rise in creatinine.  - Continue Lasix qd.  - Would hold off on cardiac MRI as we would not be able to give him contrast.    2. CAD: Moderate CAD on cath, unlikely that this is the cause of cardiomyopathy.  Continue atorvastatin and ASA 81.   3. Hyperlipidemia: Goal LDL < 70.   4. CKD: Stage III.  Creatinine rose after cath. As above, will repeat BMET today.   Followup in 2 months with labs shortly before the visit.  Dorothy Spark 04/02/2013 9:11 AM

## 2013-04-09 ENCOUNTER — Encounter: Payer: Self-pay | Admitting: Family Medicine

## 2013-04-09 ENCOUNTER — Telehealth: Payer: Self-pay | Admitting: Family Medicine

## 2013-04-09 ENCOUNTER — Ambulatory Visit (INDEPENDENT_AMBULATORY_CARE_PROVIDER_SITE_OTHER): Payer: BC Managed Care – PPO | Admitting: Family Medicine

## 2013-04-09 VITALS — BP 112/70 | HR 73 | Temp 97.7°F | Wt 202.0 lb

## 2013-04-09 DIAGNOSIS — R21 Rash and other nonspecific skin eruption: Secondary | ICD-10-CM

## 2013-04-09 DIAGNOSIS — I10 Essential (primary) hypertension: Secondary | ICD-10-CM

## 2013-04-09 DIAGNOSIS — E119 Type 2 diabetes mellitus without complications: Secondary | ICD-10-CM

## 2013-04-09 DIAGNOSIS — J309 Allergic rhinitis, unspecified: Secondary | ICD-10-CM

## 2013-04-09 LAB — HM DIABETES FOOT EXAM: HM Diabetic Foot Exam: NORMAL

## 2013-04-09 LAB — HEMOGLOBIN A1C: Hgb A1c MFr Bld: 8.4 % — ABNORMAL HIGH (ref 4.6–6.5)

## 2013-04-09 MED ORDER — CLOTRIMAZOLE-BETAMETHASONE 1-0.05 % EX CREA: 1.0000 "application " | TOPICAL_CREAM | Freq: Two times a day (BID) | CUTANEOUS | Status: DC

## 2013-04-09 MED ORDER — AZELASTINE HCL 0.1 % NA SOLN: 2.0000 | Freq: Three times a day (TID) | NASAL | Status: DC | PRN

## 2013-04-09 NOTE — Telephone Encounter (Signed)
Relevant patient education assigned to patient using Emmi.1

## 2013-04-09 NOTE — Progress Notes (Signed)
Pre visit review using our clinic review tool, if applicable. No additional management support is needed unless otherwise documented below in the visit note. 

## 2013-04-09 NOTE — Progress Notes (Signed)
Subjective:    Patient ID: Anthony Bullock, male    DOB: 1960-02-07, 53 y.o.   MRN: [id number]  Diabetes Pertinent negatives for hypoglycemia include no dizziness. Pertinent negatives for diabetes include no chest pain.   Medical followup. Patient has type 2 diabetes which has been long-standing, CAD with recently diagnosed acute systolic heart failure superimposed on probably some chronic systolic failure. He has hypertension, dyslipidemia, chronic kidney disease stage III, and seasonal and perennial allergies. Is also prior history of kidney stones.  Has heart failure and is getting defibrillator placed in a couple of weeks. Dyspnea has been stable. No recent chest pains.  Type 2 diabetes. Currently Lantus 50 units once daily. He has not recently been taking his Humalog at supper because of some hypoglycemic symptoms occasionally at night. He remains on 10-15 units with breakfast and lunch. No symptoms of hyperglycemia. Recent creatinine 2.0.  Frequent nasal congestive symptoms and rhinorrhea. Cannot take decongestants. These are taking plain Allegra without much improvement. Tried nasal steroids in the past without much success.  Patient has circumferential skin lesion right lateral knee. Present for several months. Tried Neosporin without improvement.  Past Medical History  Diagnosis Date  . Allergic rhinitis   . Diabetes mellitus type II   . Hyperlipidemia   . Hypertension   . Urethral stricture   . Kidney stones   . Myocardial infarction     "stress test showed light heart attack" on 01/16/13  . Chronic systolic heart failure     a. ECHO (12/2012) EF 25-30%, HK entireanteroseptal myocardium b. Cath 01/31/13 RA 6, RV 48/5/7, PA 47/22 (33), PCW 20, Fick CO/CI 5.2/2.5, PVR 2.5   Past Surgical History  Procedure Laterality Date  . Reapea urethral surgery for recurrent obstruction  2011  . Total knee arthroplasty  2007  . Left a nd right heart cath  01/30/2013    DR BENSIHMON    reports that he quit smoking about 2 years ago. His smoking use included Cigarettes. He has a 64 pack-year smoking history. He does not have any smokeless tobacco history on file. He reports that he does not drink alcohol or use illicit drugs. family history includes Alcohol abuse in his other; Anemia in his sister; Diabetes in his other; Heart disease in his other; Stroke in his other. Allergies  Allergen Reactions  . Morphine Sulfate Rash    REACTION: Itches all over, red spots      Review of Systems  Constitutional: Negative for appetite change and unexpected weight change.  Respiratory: Negative for cough and shortness of breath.   Cardiovascular: Negative for chest pain, palpitations and leg swelling.  Skin: Positive for rash.  Neurological: Negative for dizziness and syncope.       Objective:   Physical Exam  Constitutional: He appears well-developed and well-nourished.  Cardiovascular: Normal rate.   Pulmonary/Chest: Effort normal and breath sounds normal. No respiratory distress. He has no wheezes. He has no rales.  Musculoskeletal: He exhibits no edema.  Skin: Rash noted.  Feet reveal no skin lesions. Good distal foot pulses. Good capillary refill. No calluses. Impaired sensation with monofilament testing.  circumferential 2 cm rash well-demarcated right lateral knee. Slightly scaly surface. Slightly erythematous. No pustules.             Assessment & Plan:  #1 type 2 diabetes. History recent suboptimal control, though improving control. Repeat A1c. We've asked Hx of postprandial blood sugars help gauge need for mealtime insulin adjustment. #2 hypertension. Well  controlled #3 skin rash right lateral knee. Question fungal. Lotrisone cream twice a day. Touch base if not resolving in 3-4 weeks. Consider fungal cultures not improving #4 perennial and seasonal allergic rhinitis. Avoid decongestants. Astelin nasal 1-2 sprays per nostril 3 times a day when necessary

## 2013-04-12 ENCOUNTER — Encounter (HOSPITAL_COMMUNITY): Payer: Self-pay | Admitting: Pharmacy Technician

## 2013-04-16 ENCOUNTER — Other Ambulatory Visit (INDEPENDENT_AMBULATORY_CARE_PROVIDER_SITE_OTHER): Payer: BC Managed Care – PPO

## 2013-04-16 ENCOUNTER — Telehealth: Payer: Self-pay | Admitting: Family Medicine

## 2013-04-16 DIAGNOSIS — I5022 Chronic systolic (congestive) heart failure: Secondary | ICD-10-CM

## 2013-04-16 DIAGNOSIS — I509 Heart failure, unspecified: Secondary | ICD-10-CM

## 2013-04-16 LAB — COMPREHENSIVE METABOLIC PANEL
ALT: 27 U/L (ref 0–53)
AST: 29 U/L (ref 0–37)
Albumin: 3.2 g/dL — ABNORMAL LOW (ref 3.5–5.2)
Alkaline Phosphatase: 48 U/L (ref 39–117)
BUN: 49 mg/dL — ABNORMAL HIGH (ref 6–23)
CO2: 27 mEq/L (ref 19–32)
Calcium: 8.5 mg/dL (ref 8.4–10.5)
Chloride: 103 mEq/L (ref 96–112)
Creatinine, Ser: 3 mg/dL — ABNORMAL HIGH (ref 0.4–1.5)
GFR: 23.42 mL/min — ABNORMAL LOW (ref >60.00)
Glucose, Bld: 83 mg/dL (ref 70–99)
Potassium: 5.6 mEq/L — ABNORMAL HIGH (ref 3.5–5.1)
Sodium: 135 mEq/L (ref 135–145)
Total Bilirubin: 0.4 mg/dL (ref 0.3–1.2)
Total Protein: 6.6 g/dL (ref 6.0–8.3)

## 2013-04-16 LAB — BASIC METABOLIC PANEL
BUN: 49 mg/dL — ABNORMAL HIGH (ref 6–23)
CO2: 27 mEq/L (ref 19–32)
Calcium: 8.5 mg/dL (ref 8.4–10.5)
Chloride: 103 mEq/L (ref 96–112)
Creatinine, Ser: 3 mg/dL — ABNORMAL HIGH (ref 0.4–1.5)
GFR: 23.42 mL/min — ABNORMAL LOW (ref >60.00)
Glucose, Bld: 83 mg/dL (ref 70–99)
Potassium: 5.6 mEq/L — ABNORMAL HIGH (ref 3.5–5.1)
Sodium: 135 mEq/L (ref 135–145)

## 2013-04-16 LAB — CBC WITH DIFFERENTIAL/PLATELET
Basophils Absolute: 0 10*3/uL (ref 0.0–0.1)
Basophils Relative: 0.5 % (ref 0.0–3.0)
Eosinophils Absolute: 0.2 10*3/uL (ref 0.0–0.7)
Eosinophils Relative: 4.4 % (ref 0.0–5.0)
HCT: 29.8 % — ABNORMAL LOW (ref 39.0–52.0)
Hemoglobin: 10.2 g/dL — ABNORMAL LOW (ref 13.0–17.0)
Lymphocytes Relative: 25.7 % (ref 12.0–46.0)
Lymphs Abs: 1.4 10*3/uL (ref 0.7–4.0)
MCHC: 34.1 g/dL (ref 30.0–36.0)
MCV: 78.3 fl (ref 78.0–100.0)
Monocytes Absolute: 0.5 10*3/uL (ref 0.1–1.0)
Monocytes Relative: 10.2 % (ref 3.0–12.0)
Neutro Abs: 3.1 10*3/uL (ref 1.4–7.7)
Neutrophils Relative %: 59.2 % (ref 43.0–77.0)
Platelets: 188 10*3/uL (ref 150.0–400.0)
RBC: 3.81 Mil/uL — ABNORMAL LOW (ref 4.22–5.81)
RDW: 17.4 % — ABNORMAL HIGH (ref 11.5–14.6)
WBC: 5.3 10*3/uL (ref 4.5–10.5)

## 2013-04-16 NOTE — Telephone Encounter (Signed)
Spoke with patient, informed pt that Dr. Elease Hashimoto is out of the office today and i will give him a call about his results has soon as i get them.

## 2013-04-16 NOTE — Telephone Encounter (Signed)
Pt requesting results from his labs that were done on last Tuesday.

## 2013-04-17 ENCOUNTER — Telehealth: Payer: Self-pay | Admitting: Cardiology

## 2013-04-17 ENCOUNTER — Other Ambulatory Visit: Payer: Self-pay

## 2013-04-17 DIAGNOSIS — I5021 Acute systolic (congestive) heart failure: Secondary | ICD-10-CM

## 2013-04-17 MED ORDER — RAMIPRIL 2.5 MG PO CAPS: 2.5000 mg | ORAL_CAPSULE | Freq: Every day | ORAL | Status: DC

## 2013-04-17 NOTE — Telephone Encounter (Signed)
A1C is 8.4% which is slightly improved.

## 2013-04-17 NOTE — Telephone Encounter (Signed)
Pt informed

## 2013-04-17 NOTE — Telephone Encounter (Signed)
New message    Patient calling stating nurse called him today.

## 2013-04-18 ENCOUNTER — Telehealth: Payer: Self-pay | Admitting: Cardiology

## 2013-04-18 ENCOUNTER — Other Ambulatory Visit: Payer: Self-pay

## 2013-04-18 DIAGNOSIS — N183 Chronic kidney disease, stage 3 unspecified: Secondary | ICD-10-CM

## 2013-04-18 NOTE — Telephone Encounter (Signed)
New message    If pt has blood work first thing Monday Monday--will he get the results by Monday pm?  He is scheduled for surgery Tuesday.  Pt stopped lots of medications and he needs to be off medications a certain number of days before having labs drawn.  Coming before Monday will be too soon.  He had labs drawn earlier and the results were high--he was told to stop certain meds and redraw labs.

## 2013-04-18 NOTE — Telephone Encounter (Signed)
Spoke with patient and let him know I would discuss with Dr Rayann Heman tomorrow and get back with him about when to repeat his labs and what Dr Francesca Oman suggestions were  He was very pleasant and appreciated all of our efforts

## 2013-04-19 NOTE — Telephone Encounter (Signed)
Discussed with Dr Rayann Heman, will arrange for Dr Janice Norrie with [facility] [phone] to follow up with the patient ASAP for worsening kidney function  Patient to have repeat BMP on Monday.  Will cancel ICD implant for Tues.  Patient aware and agrees with plan of care

## 2013-04-19 NOTE — Telephone Encounter (Addendum)
Called Dr Sammie Bench office and spoke with triage nurse, Roselyn Reef.  Can get him worked in next week with Dr to evaluate his kidney disease.  Roselyn Reef will call the patient with date and time for appointment

## 2013-04-22 ENCOUNTER — Other Ambulatory Visit (INDEPENDENT_AMBULATORY_CARE_PROVIDER_SITE_OTHER): Payer: BC Managed Care – PPO

## 2013-04-22 ENCOUNTER — Other Ambulatory Visit: Payer: BC Managed Care – PPO

## 2013-04-22 ENCOUNTER — Telehealth: Payer: Self-pay

## 2013-04-22 DIAGNOSIS — N183 Chronic kidney disease, stage 3 unspecified: Secondary | ICD-10-CM

## 2013-04-22 LAB — BASIC METABOLIC PANEL
BUN: 30 mg/dL — ABNORMAL HIGH (ref 6–23)
CO2: 26 mEq/L (ref 19–32)
Calcium: 8.8 mg/dL (ref 8.4–10.5)
Chloride: 110 mEq/L (ref 96–112)
Creatinine, Ser: 1.9 mg/dL — ABNORMAL HIGH (ref 0.4–1.5)
GFR: 39.11 mL/min — ABNORMAL LOW (ref >60.00)
Glucose, Bld: 127 mg/dL — ABNORMAL HIGH (ref 70–99)
Potassium: 4.3 mEq/L (ref 3.5–5.1)
Sodium: 142 mEq/L (ref 135–145)

## 2013-04-22 NOTE — Telephone Encounter (Signed)
Relevant patient education assigned to patient using Emmi. ° °

## 2013-04-22 NOTE — Telephone Encounter (Signed)
Spoke with patient and gave him his lab result. Let him know Dr Rayann Heman wants him to have his kidneys assessed first before proceeding with ICD implant.  I have called Roselyn Reef again at [facility] and she assures me he will get in this week.  The patient is going to call me if he does not hear form them

## 2013-04-30 NOTE — Telephone Encounter (Signed)
Received note from [facility]  Will give to Dr Rayann Heman for review tomorrow

## 2013-05-02 ENCOUNTER — Other Ambulatory Visit: Payer: Self-pay | Admitting: Family Medicine

## 2013-05-06 ENCOUNTER — Ambulatory Visit (INDEPENDENT_AMBULATORY_CARE_PROVIDER_SITE_OTHER): Payer: BC Managed Care – PPO | Admitting: Cardiology

## 2013-05-06 ENCOUNTER — Encounter: Payer: Self-pay | Admitting: Cardiology

## 2013-05-06 ENCOUNTER — Encounter: Payer: Self-pay | Admitting: *Deleted

## 2013-05-06 VITALS — BP 163/76 | HR 72 | Ht 71.0 in | Wt 203.0 lb

## 2013-05-06 DIAGNOSIS — I509 Heart failure, unspecified: Secondary | ICD-10-CM

## 2013-05-06 DIAGNOSIS — I10 Essential (primary) hypertension: Secondary | ICD-10-CM

## 2013-05-06 DIAGNOSIS — I5021 Acute systolic (congestive) heart failure: Secondary | ICD-10-CM

## 2013-05-06 DIAGNOSIS — E785 Hyperlipidemia, unspecified: Secondary | ICD-10-CM

## 2013-05-06 DIAGNOSIS — I2510 Atherosclerotic heart disease of native coronary artery without angina pectoris: Secondary | ICD-10-CM

## 2013-05-06 DIAGNOSIS — I5022 Chronic systolic (congestive) heart failure: Secondary | ICD-10-CM

## 2013-05-06 MED ORDER — AMLODIPINE BESYLATE 2.5 MG PO TABS: 2.5000 mg | ORAL_TABLET | Freq: Every day | ORAL | Status: DC

## 2013-05-06 NOTE — Progress Notes (Signed)
Patient ID: Anthony Bullock, male   DOB: June 29, 1960, 53 y.o.   MRN: [id number]    PCP: Dr. Elease Hashimoto  53 yo with history of diabetes, diabetic nephropathy, CAD, and primarily nonischemic cardiomyopathy presents for cardiology followup. Had had LHC in 1/15 with some CAD but no interventional target (looks like diabetic vascular disease).  Initial echo in 12/14 with EF 25-30%.  I reviewed his echo today: EF remains low at 25%. Weight is down 10 lbs since last appointment.  After cath, creatinine went up to 2.4, most recently back to 2.0.  Ramipril was decreased to 5 mg daily.  He denies exertional dyspnea or chest pain.  He can climb a flight of steps without problems.  No orthopnea (improved), no PND, no LE edema.  He continues to work with limited weight lifting to 30 pounds and has no problem doing that. He was seen by Dr. Rayann Heman and scheduled for an ICD implantation in April. This was cancelled because of Crea elevation 1.8 --> 2.4--> 3.0. His Lasix has been held for the last 3 weeks and now feels SOB after walking 1 flight of stairs. His weight increased from 196 to 201 lbs in 1 week. He is scheduled for CTA of the abdomen/pelvis this Wednesday followed by Nephrology and urology appointment.   ECG: NSR, normal (QRS not widened)  PMH: 1. Cardiomyopathy: Primarily nonischemic.  Echo (12/14) with EF 25-30%.  Echo (3/15) with EF 25%, mild to moderately dilated LV, normal RV size and systolic function.  RHC/LHC (1/15) with mean RA 6, PA 47/22 mean 33, mean PCWP 20, PVR 2.5 WU, CI 2.5; 80% dLAD stenosis, 70% diffuse large D.  2. CAD: LHC (1/15) with 80% dLAD, 70% diffuse large diagonal.   3. 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  4. CKD stage III: Likely related to diabetes.  5. Hyperlipidemia.   FH: No premature CAD, no SCD.   SH: Married, lives in [place]. Works at [employer].  No longer smokes, quit 2012 smoked for 36 years.  ROS: All systems reviewed and negative except  as per HPI.   Current Outpatient Prescriptions  Medication Sig Dispense Refill  . aspirin 81 MG tablet Take 162 mg by mouth daily.       [name] atorvastatin (LIPITOR) 80 MG tablet Take 1 tablet (80 mg total) by mouth at bedtime.  90 tablet  1  . azelastine (ASTELIN) 137 MCG/SPRAY nasal spray Place 2 sprays into both nostrils 3 (three) times daily as needed for rhinitis. Use in each nostril as directed  30 mL  12  . carvedilol (COREG) 25 MG tablet Take 25 mg by mouth 2 (two) times daily with a meal.      . clotrimazole-betamethasone (LOTRISONE) cream Apply 1 application topically 2 (two) times daily.  30 g  1  . fenofibrate 160 MG tablet Take 160 mg by mouth daily.      . fexofenadine (ALLEGRA) 180 MG tablet Take 180 mg by mouth daily.      . furosemide (LASIX) 40 MG tablet Take 1 tablet (40 mg total) by mouth every other day.  45 tablet  1  . gabapentin (NEURONTIN) 300 MG capsule Take 1 capsule (300 mg total) by mouth 3 (three) times daily.  270 capsule  3  . Insulin Glargine (LANTUS SOLOSTAR) 100 UNIT/ML Solostar Pen INJECT 100 UNITS UNDER THE SKIN DAILY AS DIRECTED      . insulin lispro (HUMALOG) 100 UNIT/ML injection Inject 14 Units into the  skin 3 (three) times daily with meals as needed for high blood sugar.       . Olopatadine HCl (PATADAY) 0.2 % SOLN Place 1 drop into both eyes daily as needed (allergies).      . ramipril (ALTACE) 2.5 MG capsule Take 1 capsule (2.5 mg total) by mouth at bedtime.  90 capsule  1  . saxagliptin HCl (ONGLYZA) 5 MG TABS tablet Take 1 tablet (5 mg total) by mouth daily.  90 tablet  3   No current facility-administered medications for this visit.   BP 163/76  Pulse 72  Ht 5\' 11"  (1.803 m)  Wt 203 lb (92.08 kg)  BMI 28.33 kg/m2 General: NAD Neck: No JVD, no thyromegaly or thyroid nodule.  Lungs: Clear to auscultation bilaterally with normal respiratory effort. CV: Nondisplaced PMI.  Heart regular S1/S2, no S3/S4, no murmur.  No peripheral edema.  No carotid  bruit.  Normal pedal pulses.  Abdomen: Soft, nontender, no hepatosplenomegaly, no distention.  Skin: Intact without lesions or rashes.  Neurologic: Alert and oriented x 3.  Psych: Normal affect. Extremities: No clubbing or cyanosis.   ECG: SR, new T wave inversions in the lateral leads    Assessment/Plan:  1. Chronic systolic CHF: Primarily nonischemic cardiomyopathy.  EF remains 25%.  NYHA class II-III symptoms at this point.  He gained 5 lbs.  We will give one lasix dose today and then restart Lasix 40 mg po daily shortly after CTA.  We will await recommendations from nephrology and urology and follow. - Given persistently decreased EF despite medical therapy, requires an ICD>  -  He is not a candidate for CRT given narrow QRS.  - Continue Coreg to 25 mg bid.  - stop given recent rise in creatinine.  - Would hold off on cardiac MRI as we would not be able to give him contrast.    2. CAD: Moderate CAD on cath, unlikely that this is the cause of cardiomyopathy.  Continue atorvastatin and ASA 81.   3. Hyperlipidemia: Goal LDL < 70.   4. CKD: Stage III:  Creatinine rose after cath. Its most probably a combination of chronic low cardiac output, diuretic therapy and contrast nephropathy post cath. I don't expect significant improvement with Crea better than 1.8. We will d/c ramipril and start amlodipine 2.5 mg po daily.   5. Weak peripheral pulses - claudications - we will order B/L LE arterial Duplex  Followup in 2 months with labs shortly before the visit.  Dorothy Spark 05/06/2013 9:25 AM

## 2013-05-06 NOTE — Patient Instructions (Addendum)
STOP TAKING RAMIPRIL NOW  START TAKING AMLODIPINE 2.5 MG DAILY   Your physician has requested that you have a lower extremity arterial duplex. This test is an ultrasound of the arteries in the legs or arms. It looks at arterial blood flow in the legs and arms. Allow one hour for Lower and Upper Arterial scans. There are no restrictions or special instructions  Your physician recommends that you schedule a follow-up appointment FOR  2 MONTHS WITH DR Meda Coffee

## 2013-05-07 ENCOUNTER — Other Ambulatory Visit (HOSPITAL_COMMUNITY): Payer: Self-pay | Admitting: *Deleted

## 2013-05-07 DIAGNOSIS — I739 Peripheral vascular disease, unspecified: Secondary | ICD-10-CM

## 2013-05-08 ENCOUNTER — Ambulatory Visit (HOSPITAL_COMMUNITY): Payer: BC Managed Care – PPO | Attending: Internal Medicine | Admitting: Cardiology

## 2013-05-08 ENCOUNTER — Encounter: Payer: Self-pay | Admitting: Internal Medicine

## 2013-05-08 DIAGNOSIS — I5022 Chronic systolic (congestive) heart failure: Secondary | ICD-10-CM

## 2013-05-08 DIAGNOSIS — I2510 Atherosclerotic heart disease of native coronary artery without angina pectoris: Secondary | ICD-10-CM

## 2013-05-08 DIAGNOSIS — I5021 Acute systolic (congestive) heart failure: Secondary | ICD-10-CM

## 2013-05-08 DIAGNOSIS — I509 Heart failure, unspecified: Secondary | ICD-10-CM | POA: Insufficient documentation

## 2013-05-08 DIAGNOSIS — I739 Peripheral vascular disease, unspecified: Secondary | ICD-10-CM

## 2013-05-08 DIAGNOSIS — R0989 Other specified symptoms and signs involving the circulatory and respiratory systems: Secondary | ICD-10-CM | POA: Insufficient documentation

## 2013-05-08 DIAGNOSIS — I5023 Acute on chronic systolic (congestive) heart failure: Secondary | ICD-10-CM | POA: Insufficient documentation

## 2013-05-08 DIAGNOSIS — I10 Essential (primary) hypertension: Secondary | ICD-10-CM

## 2013-05-08 DIAGNOSIS — E785 Hyperlipidemia, unspecified: Secondary | ICD-10-CM | POA: Insufficient documentation

## 2013-05-08 NOTE — Progress Notes (Signed)
Lower arterial doppler and duplex bilateral complete.

## 2013-05-15 ENCOUNTER — Telehealth: Payer: Self-pay | Admitting: Cardiology

## 2013-05-15 NOTE — Telephone Encounter (Signed)
Spoke with pt about wanting his lower extremity duplex results.  Advised pt that Dr Meda Coffee has not reviewed those results yet. Informed pt that when Dr Meda Coffee gives her final report on this test, that I will notify him of this. Pt verbalized understanding and pleased with the call back.

## 2013-05-15 NOTE — Telephone Encounter (Signed)
New message     Want ultrasound on legs results

## 2013-05-15 NOTE — Telephone Encounter (Deleted)
Spoke with pt about wanting the results fr

## 2013-05-16 ENCOUNTER — Telehealth: Payer: Self-pay | Admitting: Internal Medicine

## 2013-05-16 NOTE — Telephone Encounter (Signed)
Spoke with patient and advised him that procedure is scheduled for May 28 with Dr. Rayann Heman.  I advised that I will forward message to Dr. Jackalyn Lombard nurse, Janan Halter, RN so that she will be aware that his urologist has cleared him.  Patient requests date that he will need to return for lab work.  I advised him that Claiborne Billings will call him to schedule.  Patient verbalized understanding and agreement.

## 2013-05-16 NOTE — Telephone Encounter (Signed)
New message     Went to urologist--he has cleared him for his defib surgery.  Please go ahead and schedule surgery

## 2013-05-21 ENCOUNTER — Telehealth: Payer: Self-pay | Admitting: Internal Medicine

## 2013-05-21 NOTE — Telephone Encounter (Signed)
New message     What day is pt supposed to come in and have labs drawn prior to procedure and when/where is his procedure

## 2013-05-21 NOTE — Telephone Encounter (Signed)
Dr Rayann Heman wants to see first  Anthony Bullock is calling to schedule patient an appointment

## 2013-05-21 NOTE — Telephone Encounter (Signed)
Will have Melissa call patient and schedule for follow up with Dr Rayann Heman prior to implant

## 2013-05-23 ENCOUNTER — Telehealth: Payer: Self-pay | Admitting: Internal Medicine

## 2013-05-23 NOTE — Telephone Encounter (Signed)
CAlled and got no answer.  I will have Melissa call him back tomorrow as she left him a message earlier today

## 2013-05-23 NOTE — Telephone Encounter (Signed)
New message     Talk to [facility] regarding his defibulator procedure.

## 2013-05-24 NOTE — Telephone Encounter (Signed)
Follow up     Pt want to talk to [facility] today regarding defibulator  procedure

## 2013-05-24 NOTE — Telephone Encounter (Signed)
Anthony Bullock has tried calling the patient again today and gets no answer on one number and voicemail on the other.  He needs to be seen by [facility] prior to implant

## 2013-05-24 NOTE — Telephone Encounter (Signed)
Anthony Bullock spoke with patient to give him an appointment with Dr Rayann Heman prior to implant per Dr Jackalyn Lombard request.  Patient says he does not need another consult and will not come in again.  He wants Dr Rayann Heman to call him.  Feels it is a rip off and only needs to come in for labs.  Felt we wanted to see him again for more money.  Let him know Dr Rayann Heman out until next Wed and would relay the message

## 2013-05-27 ENCOUNTER — Other Ambulatory Visit: Payer: Self-pay | Admitting: *Deleted

## 2013-05-27 MED ORDER — CARVEDILOL 25 MG PO TABS
25.0000 mg | ORAL_TABLET | Freq: Two times a day (BID) | ORAL | Status: DC
Start: 2013-05-27 — End: 2013-09-04

## 2013-05-27 NOTE — Telephone Encounter (Signed)
As his renal function has been quite labile, I have deferred ICD implant.  I have reviewed epic, but do not see any notes from nephrology.  Please obtain these records for my review. In the setting of his renal failure, I think that we should give consideration and discussion to a Sub Q ICD device.  I would need to see this patient in the office before scheduling any EP procedures.  An alternative would be for him to follow-up with Dr Caryl Comes for consideration of a S-ICD. Either way, I think that we need nephrology results before scheduling EP follow-up in the office.

## 2013-05-28 NOTE — Telephone Encounter (Signed)
Records are scanned in under media tab.  Anthony Bullock has called and we have spoken that you needed to see him prior to proceeding.  He has requested to speak with the MD over the phone

## 2013-06-04 ENCOUNTER — Telehealth: Payer: Self-pay | Admitting: Cardiology

## 2013-06-04 NOTE — Telephone Encounter (Signed)
New message     Patient calling regarding set up de fib surgery not with Dr. Rayann Heman.

## 2013-06-06 ENCOUNTER — Encounter (HOSPITAL_COMMUNITY): Admission: RE | Payer: Self-pay | Source: Ambulatory Visit

## 2013-06-06 ENCOUNTER — Ambulatory Visit (HOSPITAL_COMMUNITY)
Admission: RE | Admit: 2013-06-06 | Payer: BC Managed Care – PPO | Source: Ambulatory Visit | Admitting: Internal Medicine

## 2013-06-06 SURGERY — IMPLANTABLE CARDIOVERTER DEFIBRILLATOR IMPLANT
Anesthesia: LOCAL

## 2013-06-07 ENCOUNTER — Ambulatory Visit: Payer: BC Managed Care – PPO | Admitting: Cardiology

## 2013-06-07 ENCOUNTER — Other Ambulatory Visit: Payer: BC Managed Care – PPO

## 2013-06-11 ENCOUNTER — Ambulatory Visit (INDEPENDENT_AMBULATORY_CARE_PROVIDER_SITE_OTHER): Payer: BC Managed Care – PPO | Admitting: Internal Medicine

## 2013-06-11 ENCOUNTER — Encounter: Payer: Self-pay | Admitting: Internal Medicine

## 2013-06-11 VITALS — BP 150/73 | HR 72 | Ht 71.0 in | Wt 209.0 lb

## 2013-06-11 DIAGNOSIS — I428 Other cardiomyopathies: Secondary | ICD-10-CM

## 2013-06-11 NOTE — Progress Notes (Signed)
ELECTROPHYSIOLOGY CONSULT NOTE  Patient ID: JAN SAWADA, MRN: [id number], DOB/AGE: [birth year] 52 y.o. Admit date: (Not on file) Date of Consult: 06/11/2013  Primary Physician: [name], MD Primary Cardiologist: KN Chief Complaint:  ICD   HPI DIERKS MCLAUCHLAN is a 53 y.o. male  Referred for consideration of ICD  He has hx of CAD and nonischemic cardiomyopathy identified 12/14 at which time EF 25 %   Repeat Echo 25% 4/15  He is noticed shortness of breath with heavy exertion. He has peripheral edema which is variable. He denies nocturnal dyspnea. He's had no syncope. He does have tachycardia palpitations which are relatively brief, i.e. less than 5-10 seconds. They're associated with some lightheadedness.  He has class 2-3 Sx  ACE Rx d/c 2/2 renal insufficiencyl;  He is on betablockers      Past Medical History  Diagnosis Date  . Allergic rhinitis   . Diabetes mellitus type II   . Hyperlipidemia   . Hypertension   . Urethral stricture   . Kidney stones   . Myocardial infarction     "stress test showed light heart attack" on 01/16/13  . Chronic systolic heart failure     a. ECHO (12/2012) EF 25-30%, HK entireanteroseptal myocardium b. Cath 01/31/13 RA 6, RV 48/5/7, PA 47/22 (33), PCW 20, Fick CO/CI 5.2/2.5, PVR 2.5      Surgical History:  Past Surgical History  Procedure Laterality Date  . Reapea urethral surgery for recurrent obstruction  2011  . Total knee arthroplasty  2007  . Left a nd right heart cath  01/30/2013    DR BENSIHMON     Home Meds: Prior to Admission medications   Medication Sig Start Date End Date Taking? Authorizing Provider  amLODipine (NORVASC) 2.5 MG tablet Take 1 tablet (2.5 mg total) by mouth daily. 05/06/13   Dorothy Spark, MD  aspirin 81 MG tablet Take 162 mg by mouth daily.     Historical Provider, MD  atorvastatin (LIPITOR) 80 MG tablet Take 1 tablet (80 mg total) by mouth at bedtime. 04/02/13   Dorothy Spark, MD  azelastine  (ASTELIN) 137 MCG/SPRAY nasal spray Place 2 sprays into both nostrils 3 (three) times daily as needed for rhinitis. Use in each nostril as directed 04/09/13   [name], MD  carvedilol (COREG) 25 MG tablet Take 1 tablet (25 mg total) by mouth 2 (two) times daily with a meal. 05/27/13   Dorothy Spark, MD  clotrimazole-betamethasone (LOTRISONE) cream Apply 1 application topically 2 (two) times daily. 04/09/13   [name], MD  fenofibrate 160 MG tablet Take 160 mg by mouth daily.    Historical Provider, MD  fexofenadine (ALLEGRA) 180 MG tablet Take 180 mg by mouth daily.    Historical Provider, MD  furosemide (LASIX) 40 MG tablet Take 1 tablet (40 mg total) by mouth every other day. 04/02/13   Dorothy Spark, MD  gabapentin (NEURONTIN) 300 MG capsule Take 1 capsule (300 mg total) by mouth 3 (three) times daily. 11/10/11   [name], MD  Insulin Glargine (LANTUS SOLOSTAR) 100 UNIT/ML Solostar Pen INJECT 100 UNITS UNDER THE SKIN DAILY AS DIRECTED 05/02/13   [name], MD  insulin lispro (HUMALOG) 100 UNIT/ML injection Inject 14 Units into the skin 3 (three) times daily with meals as needed for high blood sugar.  10/29/12 10/29/13  [name], MD  Olopatadine HCl (PATADAY) 0.2 % SOLN Place 1 drop into both  eyes daily as needed (allergies).    Historical Provider, MD  saxagliptin HCl (ONGLYZA) 5 MG TABS tablet Take 1 tablet (5 mg total) by mouth daily. 10/29/12   [name], MD     Allergies:  Allergies  Allergen Reactions  . Morphine Sulfate Rash    REACTION: Itches all over, red spots    History   Social History  . Marital Status: Married    Spouse Name: N/A    Number of Children: N/A  . Years of Education: N/A   Occupational History  . Not on file.   Social History Main Topics  . Smoking status: Former Smoker -- 2.00 packs/day for 32 years    Types: Cigarettes    Quit date: 05/11/2010  . Smokeless tobacco: Not on file  . Alcohol Use: No   . Drug Use: No  . Sexual Activity: Not on file   Other Topics Concern  . 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 Alcohol abuse Other   . Heart disease Other   . Diabetes Other   . Stroke Other   . Anemia Sister      ROS:  Please see the history of present illness.     All other systems reviewed and negative.    Physical Exam: Blood pressure 150/73, pulse 72, height 5\' 11"  (1.803 m), weight 209 lb (94.802 kg). General: Well developed, well nourished male in no acute distress. Head: Normocephalic, atraumatic, sclera non-icteric, no xanthomas, nares are without discharge. EENT: normal Lymph Nodes:  none Back: without scoliosis/kyphosis, no CVA tendersness Neck: Negative for carotid bruits. JVD not elevated. Lungs: Clear bilaterally to auscultation without wheezes, rales, or rhonchi. Breathing is unlabored. Heart: RRR with S1 S2. 2/6 systolic murmur , rubs, or gallops appreciated. Abdomen: Soft, non-tender, non-distended with normoactive bowel sounds. No hepatomegaly. No rebound/guarding. No obvious abdominal masses. Msk:  Strength and tone appear normal for age. Extremities: No clubbing or cyanosis. No edema.  Distal pedal pulses are 2+ and equal bilaterally. Skin: Warm and Dry Neuro: Alert and oriented X 3. CN III-XII intact Grossly normal sensory and motor function . Psych:  Responds to questions appropriately with a normal affect.      Labs: Cardiac Enzymes No results found for this basename: CKTOTAL, CKMB, TROPONINI,  in the last 72 hours CBC Lab Results  Component Value Date   WBC 5.3 04/16/2013   HGB 10.2* 04/16/2013   HCT 29.8* 04/16/2013   MCV 78.3 04/16/2013   PLT 188.0 04/16/2013   PROTIME: No results found for this basename: LABPROT, INR,  in the last 72 hours Chemistry No results found for this basename: NA, K, CL, CO2, BUN, CREATININE, CALCIUM, LABALBU, PROT, BILITOT,  ALKPHOS, ALT, AST, GLUCOSE,  in the last 168 hours Lipids Lab Results  Component Value Date   CHOL 165 11/30/2012   HDL 45.70 11/30/2012   LDLCALC 104* 11/30/2012   TRIG 77.0 11/30/2012   BNP Pro B Natriuretic peptide (BNP)  Date/Time Value Ref Range Status  01/16/2013  9:57 AM 560.0* 0.0 - 100.0 pg/mL Final  11/30/2012  9:41 AM 544.0* 0.0 - 100.0 pg/mL Final   Miscellaneous No results found for this basename: DDIMER    Radiology/Studies:  No results found.  EKG:  Sinus rhythm at 72 intervals 15/10/38 Nonspecific T wave changes  Assessment and Plan:   Nonischemic cardiac  myopathy  Congestive heart failure-chronic-systolic class 2-3  Renal insufficiency grade 3   Hypertension  Tachypalpitations  The patient has persistent left ventricular dysfunction despite guidelines directed medical therapy. He has class 2-3 heart failure and renal insufficiency the latter having an impact on thoughts regarding mode of ICD insertion. Concerned his other physicians is that with renal insufficiency a transvenous device has the potential to impair venous access for dialysis in his young man. Hence, we recommend consideration for subcutaneous ICD so to avoid obstruction of venous access.  We have discussed the benefits and risks associated with ICD insertion as well as the differences between subcutaneous ICD and transvenous ICD related to battery longevity , long-term outcome data, infection implications as well as issues related to venous obstruction from transvenous access. He would like to consider subcutaneous ICD. We will plan to screen him.   We will increase his amlodipine 2.5--5 mg.  Mechanism of his palpitations are somewhat worrisome given his left ventricular dysfunction. They could represent ventricular or atrial arrhythmias.     Deboraha Sprang

## 2013-06-11 NOTE — Patient Instructions (Addendum)
Your physician recommends that you continue on your current medications as directed. Please refer to the Current Medication list given to you today.  Your physician has recommended that you have a subcutaneous defibrillator inserted.  This device uses electrical pulses or shocks to help control life-threatening, irregular heartbeats that could lead the heart to suddenly stop beating (sudden cardiac arrest). Leads are attached to the ICD that goes into your heart. This is done in the hospital and usually requires an overnight stay.   Please come in tomorrow at 3:15 for S-ICD screening.

## 2013-06-14 ENCOUNTER — Telehealth: Payer: Self-pay | Admitting: *Deleted

## 2013-06-14 ENCOUNTER — Other Ambulatory Visit: Payer: Self-pay | Admitting: *Deleted

## 2013-06-14 ENCOUNTER — Encounter: Payer: Self-pay | Admitting: *Deleted

## 2013-06-14 DIAGNOSIS — I10 Essential (primary) hypertension: Secondary | ICD-10-CM

## 2013-06-14 DIAGNOSIS — Z01812 Encounter for preprocedural laboratory examination: Secondary | ICD-10-CM

## 2013-06-14 DIAGNOSIS — I5021 Acute systolic (congestive) heart failure: Secondary | ICD-10-CM

## 2013-06-14 DIAGNOSIS — I428 Other cardiomyopathies: Secondary | ICD-10-CM

## 2013-06-14 DIAGNOSIS — I5022 Chronic systolic (congestive) heart failure: Secondary | ICD-10-CM

## 2013-06-14 DIAGNOSIS — I2510 Atherosclerotic heart disease of native coronary artery without angina pectoris: Secondary | ICD-10-CM

## 2013-06-14 DIAGNOSIS — E785 Hyperlipidemia, unspecified: Secondary | ICD-10-CM

## 2013-06-14 MED ORDER — AMLODIPINE BESYLATE 2.5 MG PO TABS: 2.5000 mg | ORAL_TABLET | Freq: Every day | ORAL | Status: DC

## 2013-06-14 MED ORDER — FENOFIBRATE 160 MG PO TABS: 160.0000 mg | ORAL_TABLET | Freq: Every day | ORAL | Status: DC

## 2013-06-14 NOTE — Telephone Encounter (Deleted)
Error

## 2013-06-14 NOTE — Telephone Encounter (Signed)
Called pt to schedule S-ICD (subcutaneous ICD). Scheduled for 06/27/13 with Dr Caryl Comes. Reviewed instructions/procedure with patient and letter of instructions left at the front desk for pt to pick up next week. Pre procedure lab work scheduled for 6/11. Wound check scheduled for 6/29. Patient verbalized understanding and agreeable to plan.

## 2013-06-20 ENCOUNTER — Other Ambulatory Visit (INDEPENDENT_AMBULATORY_CARE_PROVIDER_SITE_OTHER): Payer: BC Managed Care – PPO

## 2013-06-20 DIAGNOSIS — I428 Other cardiomyopathies: Secondary | ICD-10-CM

## 2013-06-20 DIAGNOSIS — Z01812 Encounter for preprocedural laboratory examination: Secondary | ICD-10-CM

## 2013-06-20 LAB — BASIC METABOLIC PANEL
BUN: 33 mg/dL — ABNORMAL HIGH (ref 6–23)
CO2: 28 mEq/L (ref 19–32)
Calcium: 8.7 mg/dL (ref 8.4–10.5)
Chloride: 108 mEq/L (ref 96–112)
Creatinine, Ser: 2.1 mg/dL — ABNORMAL HIGH (ref 0.4–1.5)
GFR: 36.46 mL/min — ABNORMAL LOW (ref >60.00)
Glucose, Bld: 87 mg/dL (ref 70–99)
Potassium: 4.4 mEq/L (ref 3.5–5.1)
Sodium: 138 mEq/L (ref 135–145)

## 2013-06-20 LAB — CBC WITH DIFFERENTIAL/PLATELET
Basophils Absolute: 0 10*3/uL (ref 0.0–0.1)
Basophils Relative: 0.5 % (ref 0.0–3.0)
Eosinophils Absolute: 0.2 10*3/uL (ref 0.0–0.7)
Eosinophils Relative: 4.5 % (ref 0.0–5.0)
HCT: 30.8 % — ABNORMAL LOW (ref 39.0–52.0)
Hemoglobin: 10.3 g/dL — ABNORMAL LOW (ref 13.0–17.0)
Lymphocytes Relative: 22.8 % (ref 12.0–46.0)
Lymphs Abs: 1.1 10*3/uL (ref 0.7–4.0)
MCHC: 33.6 g/dL (ref 30.0–36.0)
MCV: 83.4 fl (ref 78.0–100.0)
Monocytes Absolute: 0.4 10*3/uL (ref 0.1–1.0)
Monocytes Relative: 9.2 % (ref 3.0–12.0)
Neutro Abs: 3 10*3/uL (ref 1.4–7.7)
Neutrophils Relative %: 63 % (ref 43.0–77.0)
Platelets: 198 10*3/uL (ref 150.0–400.0)
RBC: 3.69 Mil/uL — ABNORMAL LOW (ref 4.22–5.81)
RDW: 13.5 % (ref 11.5–15.5)
WBC: 4.7 10*3/uL (ref 4.0–10.5)

## 2013-06-21 ENCOUNTER — Telehealth: Payer: Self-pay | Admitting: Internal Medicine

## 2013-06-21 NOTE — Telephone Encounter (Signed)
New message          Pt would like to know the results of his blood work / is pt ok to have surgery?

## 2013-06-21 NOTE — Telephone Encounter (Signed)
Patient requesting advisement from Dr. Caryl Comes regarding if his lab results are satisfactory for his Defibrillator procedure on 6/18.  (HCT/HGB on low side). Forwarded to Dr. Caryl Comes.

## 2013-06-24 ENCOUNTER — Encounter: Payer: Self-pay | Admitting: Cardiology

## 2013-06-24 ENCOUNTER — Ambulatory Visit (INDEPENDENT_AMBULATORY_CARE_PROVIDER_SITE_OTHER): Payer: BC Managed Care – PPO | Admitting: Cardiology

## 2013-06-24 VITALS — BP 132/66 | HR 81 | Ht 71.0 in | Wt 207.0 lb

## 2013-06-24 DIAGNOSIS — I5022 Chronic systolic (congestive) heart failure: Secondary | ICD-10-CM

## 2013-06-24 DIAGNOSIS — I509 Heart failure, unspecified: Secondary | ICD-10-CM

## 2013-06-24 MED ORDER — TRAMADOL HCL 50 MG PO TABS: 25.0000 mg | ORAL_TABLET | Freq: Two times a day (BID) | ORAL | Status: DC | PRN

## 2013-06-24 NOTE — Progress Notes (Signed)
Patient ID: Anthony Bullock, male   DOB: [birth year], 53 y.o.   MRN: [id number] Patient ID: Anthony Bullock, male   DOB: 09/05/1960, 53 y.o.   MRN: [id number]    PCP: Dr. Elease Hashimoto  53 yo with history of diabetes, diabetic nephropathy, CAD, and primarily nonischemic cardiomyopathy presents for cardiology followup. Had had LHC in 1/15 with some CAD but no interventional target (looks like diabetic vascular disease).  Initial echo in 12/14 with EF 25-30%.  I reviewed his echo today: EF remains low at 25%. Weight is down 10 lbs since last appointment.  After cath, creatinine went up to 2.4, most recently back to 2.0.  Ramipril was decreased to 5 mg daily.  He denies exertional dyspnea or chest pain.  He can climb a flight of steps without problems.  No orthopnea (improved), no PND, no LE edema.  He continues to work with limited weight lifting to 30 pounds and has no problem doing that. He was seen by Dr. Rayann Heman and scheduled for an ICD implantation in April. This was cancelled because of Crea elevation 1.8 --> 2.4--> 3.0. His Lasix has been held for the last 3 weeks and now feels SOB after walking 1 flight of stairs. His weight increased from 196 to 201 lbs in 1 week. He is scheduled for CTA of the abdomen/pelvis this Wednesday followed by Nephrology and urology appointment.   The patient is doing great, no LE edema with Lasix 40 mg po daily, last Crea 2.1 on 06/19/13 (baseline 3.0), he is scheduled for a subcutaneous ICD on 06/27/13 with Dr Caryl Comes. He continues to work. Occasional palpitations, lasting few seconds, no syncope. No CP. NYHA I-II.  ECG: NSR, normal (QRS not widened)  PMH: 1. Cardiomyopathy: Primarily nonischemic.  Echo (12/14) with EF 25-30%.  Echo (3/15) with EF 25%, mild to moderately dilated LV, normal RV size and systolic function.  RHC/LHC (1/15) with mean RA 6, PA 47/22 mean 33, mean PCWP 20, PVR 2.5 WU, CI 2.5; 80% dLAD stenosis, 70% diffuse large D.  2. CAD: LHC (1/15) with 80% dLAD, 70%  diffuse large diagonal.   3. 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  4. CKD stage III: Likely related to diabetes.  5. Hyperlipidemia.   FH: No premature CAD, no SCD.   SH: Married, lives in [place]. Works at [employer].  No longer smokes, quit 2012 smoked for 36 years.  ROS: All systems reviewed and negative except as per HPI.   Current Outpatient Prescriptions  Medication Sig Dispense Refill  . amLODipine (NORVASC) 2.5 MG tablet Take 1 tablet (2.5 mg total) by mouth daily.  7 tablet  0  . aspirin 81 MG tablet Take 162 mg by mouth daily.       [name] atorvastatin (LIPITOR) 80 MG tablet Take 1 tablet (80 mg total) by mouth at bedtime.  90 tablet  1  . azelastine (ASTELIN) 137 MCG/SPRAY nasal spray Place 2 sprays into both nostrils 3 (three) times daily as needed for rhinitis. Use in each nostril as directed  30 mL  12  . carvedilol (COREG) 25 MG tablet Take 1 tablet (25 mg total) by mouth 2 (two) times daily with a meal.  180 tablet  0  . clotrimazole-betamethasone (LOTRISONE) cream Apply 1 application topically 2 (two) times daily.  30 g  1  . fenofibrate 160 MG tablet Take 1 tablet (160 mg total) by mouth daily.  7 tablet  0  . fexofenadine (ALLEGRA) 180 MG  tablet Take 180 mg by mouth daily.      . furosemide (LASIX) 40 MG tablet Take 1 tablet (40 mg total) by mouth every other day.  45 tablet  1  . gabapentin (NEURONTIN) 300 MG capsule Take 1 capsule (300 mg total) by mouth 3 (three) times daily.  270 capsule  3  . Insulin Glargine (LANTUS SOLOSTAR) 100 UNIT/ML Solostar Pen INJECT 100 UNITS UNDER THE SKIN DAILY AS DIRECTED      . insulin lispro (HUMALOG) 100 UNIT/ML injection Inject 14 Units into the skin 3 (three) times daily with meals as needed for high blood sugar.       . Olopatadine HCl (PATADAY) 0.2 % SOLN Place 1 drop into both eyes daily as needed (allergies).      . saxagliptin HCl (ONGLYZA) 5 MG TABS tablet Take 1 tablet (5 mg total) by mouth daily.  90  tablet  3  . traMADol (ULTRAM) 50 MG tablet Take 0.5 tablets (25 mg total) by mouth every 12 (twelve) hours as needed for moderate pain.  50 tablet  3   No current facility-administered medications for this visit.   BP 132/66  Pulse 81  Ht 5\' 11"  (1.803 m)  Wt 207 lb (93.895 kg)  BMI 28.88 kg/m2  SpO2 98% General: NAD Neck: No JVD, no thyromegaly or thyroid nodule.  Lungs: Clear to auscultation bilaterally with normal respiratory effort. CV: Nondisplaced PMI.  Heart regular S1/S2, no S3/S4, no murmur.  No peripheral edema.  No carotid bruit.  Normal pedal pulses.  Abdomen: Soft, nontender, no hepatosplenomegaly, no distention.  Skin: Intact without lesions or rashes.  Neurologic: Alert and oriented x 3.  Psych: Normal affect. Extremities: No clubbing or cyanosis.   ECG: SR, new T wave inversions in the lateral leads    Assessment/Plan:  1. Chronic systolic CHF: Primarily nonischemic cardiomyopathy.  EF remains 25%.  NYHA class II-III symptoms at this point.  He gained 5 lbs.  We will give one lasix dose today and then restart Lasix 40 mg po daily shortly after CTA.  We will await recommendations from nephrology and urology and follow. - Given persistently decreased EF despite medical therapy, requires an ICD, subcutaneous considering possible AV fistula for HD in the future.   -  He is not a candidate for CRT given narrow QRS.  - Continue Coreg to 25 mg bid.  - stop given recent rise in creatinine.  - Would hold off on cardiac MRI as we would not be able to give him contrast.    We will increase his amlodipine 2.5--5 mg.  Mechanism of his palpitations are somewhat worrisome given his left ventricular dysfunction. They could represent ventricular or atrial arrhythmias.   2. CAD: Moderate CAD on cath, unlikely that this is the cause of cardiomyopathy.  Continue atorvastatin and ASA 81.   3. Hyperlipidemia: Goal LDL < 70.   4. CKD: Stage III:  Creatinine rose after cath. Its  most probably a combination of chronic low cardiac output, diuretic therapy and contrast nephropathy post cath. I don't expect significant improvement with Crea better than 1.8. We will d/c ramipril and start amlodipine 2.5 mg po daily.   5. Weak peripheral pulses - claudications - we will order B/L LE arterial Duplex  Followup in 2 months with labs shortly before the visit.  Dorothy Spark 06/24/2013 8:52 AM

## 2013-06-24 NOTE — Addendum Note (Signed)
**Note De-Identified Anthony Bullock Obfuscation** Addended by: Dennie Fetters on: 06/24/2013 09:05 AM   Modules accepted: Orders

## 2013-06-24 NOTE — Patient Instructions (Addendum)
Your physician has recommended you make the following change in your medication: start taking Tramadol 25 mg twice daily as needed  Your physician recommends that you schedule a follow-up appointment in: 2 months  BMET on 8/17

## 2013-06-25 ENCOUNTER — Encounter (HOSPITAL_COMMUNITY): Payer: Self-pay | Admitting: Pharmacy Technician

## 2013-06-26 ENCOUNTER — Institutional Professional Consult (permissible substitution): Payer: BC Managed Care – PPO | Admitting: Internal Medicine

## 2013-06-26 NOTE — Telephone Encounter (Signed)
Left detailed message on personal voicemail informing pt Dr. Caryl Comes reviewed, and  lab work ok and procedure still scheduled for tomorrow.

## 2013-06-27 ENCOUNTER — Encounter (HOSPITAL_COMMUNITY)
Admission: RE | Disposition: A | Discharge status: Home / Self Care | Payer: Self-pay | Source: Ambulatory Visit | Attending: Internal Medicine

## 2013-06-27 ENCOUNTER — Ambulatory Visit (HOSPITAL_COMMUNITY)
Admission: RE | Admit: 2013-06-27 | Discharge: 2013-06-28 | Disposition: A | Discharge status: Home / Self Care | Payer: BC Managed Care – PPO | Source: Ambulatory Visit | Attending: Internal Medicine | Admitting: Internal Medicine

## 2013-06-27 ENCOUNTER — Ambulatory Visit (HOSPITAL_COMMUNITY): Payer: BC Managed Care – PPO | Admitting: Certified Registered Nurse Anesthetist

## 2013-06-27 ENCOUNTER — Encounter (HOSPITAL_COMMUNITY): Payer: BC Managed Care – PPO | Admitting: Certified Registered Nurse Anesthetist

## 2013-06-27 ENCOUNTER — Encounter (HOSPITAL_COMMUNITY): Payer: Self-pay | Admitting: Anesthesiology

## 2013-06-27 DIAGNOSIS — I428 Other cardiomyopathies: Secondary | ICD-10-CM

## 2013-06-27 DIAGNOSIS — E785 Hyperlipidemia, unspecified: Secondary | ICD-10-CM | POA: Diagnosis not present

## 2013-06-27 DIAGNOSIS — J449 Chronic obstructive pulmonary disease, unspecified: Secondary | ICD-10-CM | POA: Insufficient documentation

## 2013-06-27 DIAGNOSIS — E1129 Type 2 diabetes mellitus with other diabetic kidney complication: Secondary | ICD-10-CM | POA: Diagnosis present

## 2013-06-27 DIAGNOSIS — Z87891 Personal history of nicotine dependence: Secondary | ICD-10-CM | POA: Diagnosis not present

## 2013-06-27 DIAGNOSIS — N183 Chronic kidney disease, stage 3 unspecified: Secondary | ICD-10-CM | POA: Insufficient documentation

## 2013-06-27 DIAGNOSIS — Z794 Long term (current) use of insulin: Secondary | ICD-10-CM | POA: Insufficient documentation

## 2013-06-27 DIAGNOSIS — I509 Heart failure, unspecified: Secondary | ICD-10-CM | POA: Insufficient documentation

## 2013-06-27 DIAGNOSIS — Z7982 Long term (current) use of aspirin: Secondary | ICD-10-CM | POA: Insufficient documentation

## 2013-06-27 DIAGNOSIS — I252 Old myocardial infarction: Secondary | ICD-10-CM | POA: Insufficient documentation

## 2013-06-27 DIAGNOSIS — E119 Type 2 diabetes mellitus without complications: Secondary | ICD-10-CM | POA: Insufficient documentation

## 2013-06-27 DIAGNOSIS — I129 Hypertensive chronic kidney disease with stage 1 through stage 4 chronic kidney disease, or unspecified chronic kidney disease: Secondary | ICD-10-CM | POA: Diagnosis not present

## 2013-06-27 DIAGNOSIS — Z96659 Presence of unspecified artificial knee joint: Secondary | ICD-10-CM | POA: Diagnosis not present

## 2013-06-27 DIAGNOSIS — N289 Disorder of kidney and ureter, unspecified: Secondary | ICD-10-CM | POA: Insufficient documentation

## 2013-06-27 DIAGNOSIS — I5022 Chronic systolic (congestive) heart failure: Secondary | ICD-10-CM | POA: Insufficient documentation

## 2013-06-27 DIAGNOSIS — J4489 Other specified chronic obstructive pulmonary disease: Secondary | ICD-10-CM | POA: Insufficient documentation

## 2013-06-27 DIAGNOSIS — E1165 Type 2 diabetes mellitus with hyperglycemia: Secondary | ICD-10-CM

## 2013-06-27 DIAGNOSIS — E1169 Type 2 diabetes mellitus with other specified complication: Secondary | ICD-10-CM | POA: Diagnosis present

## 2013-06-27 HISTORY — PX: CARDIAC DEFIBRILLATOR PLACEMENT: SHX171

## 2013-06-27 HISTORY — PX: IMPLANTABLE CARDIOVERTER DEFIBRILLATOR IMPLANT: SHX5473

## 2013-06-27 LAB — SURGICAL PCR SCREEN
MRSA, PCR: NEGATIVE
Staphylococcus aureus: NEGATIVE

## 2013-06-27 LAB — GLUCOSE, CAPILLARY
Glucose-Capillary: 141 mg/dL — ABNORMAL HIGH (ref 70–99)
Glucose-Capillary: 128 mg/dL — ABNORMAL HIGH (ref 70–99)
Glucose-Capillary: 121 mg/dL — ABNORMAL HIGH (ref 70–99)
Glucose-Capillary: 173 mg/dL — ABNORMAL HIGH (ref 70–99)
Glucose-Capillary: 176 mg/dL — ABNORMAL HIGH (ref 70–99)

## 2013-06-27 SURGERY — IMPLANTABLE CARDIOVERTER DEFIBRILLATOR IMPLANT
Anesthesia: Monitor Anesthesia Care

## 2013-06-27 MED ORDER — CEFAZOLIN SODIUM-DEXTROSE 2-3 GM-% IV SOLR
INTRAVENOUS | Status: AC
  Filled 2013-06-27: qty 50

## 2013-06-27 MED ORDER — CARVEDILOL 25 MG PO TABS
25.0000 mg | ORAL_TABLET | Freq: Two times a day (BID) | ORAL | Status: DC
  Administered 2013-06-27 – 2013-06-28 (×2): 25 mg via ORAL
  Filled 2013-06-27 (×4): qty 1

## 2013-06-27 MED ORDER — MIDAZOLAM HCL 5 MG/5ML IJ SOLN
INTRAMUSCULAR | Status: DC | PRN
  Administered 2013-06-27: 2 mg via INTRAVENOUS

## 2013-06-27 MED ORDER — HYDROCODONE-ACETAMINOPHEN 5-325 MG PO TABS
1.0000 | ORAL_TABLET | ORAL | Status: DC | PRN
  Administered 2013-06-27 – 2013-06-28 (×3): 1 via ORAL
  Filled 2013-06-27: qty 1
  Filled 2013-06-27 (×2): qty 2

## 2013-06-27 MED ORDER — LINAGLIPTIN 5 MG PO TABS
5.0000 mg | ORAL_TABLET | Freq: Every day | ORAL | Status: DC
  Administered 2013-06-28: 5 mg via ORAL
  Filled 2013-06-27 (×2): qty 1

## 2013-06-27 MED ORDER — FENTANYL CITRATE 0.05 MG/ML IJ SOLN
INTRAMUSCULAR | Status: DC | PRN
  Administered 2013-06-27: 50 ug via INTRAVENOUS
  Administered 2013-06-27 (×2): 25 ug via INTRAVENOUS

## 2013-06-27 MED ORDER — SODIUM CHLORIDE 0.9 % IV SOLN
INTRAVENOUS | Status: AC
  Administered 2013-06-27: 12:00:00 via INTRAVENOUS

## 2013-06-27 MED ORDER — LORATADINE 10 MG PO TABS
10.0000 mg | ORAL_TABLET | Freq: Every day | ORAL | Status: DC
  Filled 2013-06-27 (×2): qty 1

## 2013-06-27 MED ORDER — ONDANSETRON HCL 4 MG/2ML IJ SOLN: 4.0000 mg | Freq: Four times a day (QID) | INTRAMUSCULAR | Status: DC | PRN

## 2013-06-27 MED ORDER — PROPOFOL 10 MG/ML IV BOLUS
INTRAVENOUS | Status: DC | PRN
  Administered 2013-06-27: 100 mg via INTRAVENOUS

## 2013-06-27 MED ORDER — PHENYLEPHRINE HCL 10 MG/ML IJ SOLN
10.0000 mg | INTRAMUSCULAR | Status: DC | PRN
  Administered 2013-06-27: 25 ug/min via INTRAVENOUS

## 2013-06-27 MED ORDER — MUPIROCIN 2 % EX OINT
TOPICAL_OINTMENT | CUTANEOUS | Status: AC
  Filled 2013-06-27: qty 22

## 2013-06-27 MED ORDER — CHLORHEXIDINE GLUCONATE 4 % EX LIQD: 60.0000 mL | Freq: Once | CUTANEOUS | Status: DC

## 2013-06-27 MED ORDER — BUPIVACAINE HCL (PF) 0.25 % IJ SOLN
INTRAMUSCULAR | Status: AC
  Filled 2013-06-27: qty 30

## 2013-06-27 MED ORDER — MUPIROCIN 2 % EX OINT: TOPICAL_OINTMENT | Freq: Two times a day (BID) | CUTANEOUS | Status: DC

## 2013-06-27 MED ORDER — SODIUM CHLORIDE 0.9 % IV SOLN
INTRAVENOUS | Status: DC
  Administered 2013-06-27: 1000 mL via INTRAVENOUS

## 2013-06-27 MED ORDER — SODIUM CHLORIDE 0.9 % IV SOLN
INTRAVENOUS | Status: DC | PRN
  Administered 2013-06-27: 08:00:00 via INTRAVENOUS

## 2013-06-27 MED ORDER — AZELASTINE HCL 0.1 % NA SOLN
2.0000 | Freq: Three times a day (TID) | NASAL | Status: DC | PRN
  Filled 2013-06-27: qty 30

## 2013-06-27 MED ORDER — EPHEDRINE SULFATE 50 MG/ML IJ SOLN
INTRAMUSCULAR | Status: DC | PRN
  Administered 2013-06-27 (×4): 5 mg via INTRAVENOUS

## 2013-06-27 MED ORDER — GABAPENTIN 300 MG PO CAPS
300.0000 mg | ORAL_CAPSULE | Freq: Three times a day (TID) | ORAL | Status: DC
  Administered 2013-06-27 – 2013-06-28 (×3): 300 mg via ORAL
  Filled 2013-06-27 (×5): qty 1

## 2013-06-27 MED ORDER — ONDANSETRON HCL 4 MG/2ML IJ SOLN
INTRAMUSCULAR | Status: DC | PRN
  Administered 2013-06-27: 4 mg via INTRAVENOUS

## 2013-06-27 MED ORDER — TRAMADOL HCL 50 MG PO TABS: 25.0000 mg | ORAL_TABLET | Freq: Two times a day (BID) | ORAL | Status: DC | PRN

## 2013-06-27 MED ORDER — FENTANYL CITRATE 0.05 MG/ML IJ SOLN
25.0000 ug | INTRAMUSCULAR | Status: DC | PRN
  Administered 2013-06-27: 50 ug via INTRAVENOUS

## 2013-06-27 MED ORDER — OLOPATADINE HCL 0.1 % OP SOLN
1.0000 [drp] | Freq: Two times a day (BID) | OPHTHALMIC | Status: DC
  Filled 2013-06-27: qty 5

## 2013-06-27 MED ORDER — FUROSEMIDE 40 MG PO TABS
40.0000 mg | ORAL_TABLET | ORAL | Status: DC
  Filled 2013-06-27: qty 1

## 2013-06-27 MED ORDER — CEFAZOLIN SODIUM-DEXTROSE 2-3 GM-% IV SOLR
2.0000 g | INTRAVENOUS | Status: AC
  Administered 2013-06-27: 2 g via INTRAVENOUS

## 2013-06-27 MED ORDER — FENOFIBRATE 160 MG PO TABS
160.0000 mg | ORAL_TABLET | Freq: Every day | ORAL | Status: DC
  Administered 2013-06-27 – 2013-06-28 (×2): 160 mg via ORAL
  Filled 2013-06-27 (×2): qty 1

## 2013-06-27 MED ORDER — ATORVASTATIN CALCIUM 80 MG PO TABS
80.0000 mg | ORAL_TABLET | Freq: Every day | ORAL | Status: DC
  Administered 2013-06-27: 80 mg via ORAL
  Filled 2013-06-27 (×2): qty 1

## 2013-06-27 MED ORDER — SODIUM CHLORIDE 0.9 % IR SOLN
80.0000 mg | Status: DC
  Filled 2013-06-27: qty 2

## 2013-06-27 MED ORDER — AMLODIPINE BESYLATE 2.5 MG PO TABS
2.5000 mg | ORAL_TABLET | Freq: Every day | ORAL | Status: DC
  Administered 2013-06-27: 2.5 mg via ORAL
  Filled 2013-06-27 (×2): qty 1

## 2013-06-27 MED ORDER — INSULIN GLARGINE 100 UNIT/ML ~~LOC~~ SOLN
50.0000 [IU] | Freq: Every day | SUBCUTANEOUS | Status: DC
  Administered 2013-06-27: 50 [IU] via SUBCUTANEOUS
  Filled 2013-06-27 (×2): qty 0.5

## 2013-06-27 MED ORDER — ASPIRIN EC 81 MG PO TBEC
162.0000 mg | DELAYED_RELEASE_TABLET | Freq: Every day | ORAL | Status: DC
  Administered 2013-06-28: 162 mg via ORAL
  Filled 2013-06-27 (×2): qty 2

## 2013-06-27 MED ORDER — PROPOFOL INFUSION 10 MG/ML OPTIME
INTRAVENOUS | Status: DC | PRN
  Administered 2013-06-27: 50 ug/kg/min via INTRAVENOUS

## 2013-06-27 MED ORDER — CEFAZOLIN SODIUM 1-5 GM-% IV SOLN
1.0000 g | Freq: Four times a day (QID) | INTRAVENOUS | Status: AC
  Administered 2013-06-27 – 2013-06-28 (×3): 1 g via INTRAVENOUS
  Filled 2013-06-27 (×3): qty 50

## 2013-06-27 MED ORDER — FENTANYL CITRATE 0.05 MG/ML IJ SOLN
INTRAMUSCULAR | Status: AC
  Filled 2013-06-27: qty 2

## 2013-06-27 MED ORDER — ACETAMINOPHEN 325 MG PO TABS: 325.0000 mg | ORAL_TABLET | ORAL | Status: DC | PRN

## 2013-06-27 NOTE — Anesthesia Postprocedure Evaluation (Signed)
  Anesthesia Post-op Note  Patient: Anthony Bullock  Procedure(s) Performed: Procedure(s): SUB Q ICD (N/A)  Patient Location: PACU  Anesthesia Type:General  Level of Consciousness: awake, alert , oriented and patient cooperative  Airway and Oxygen Therapy: Patient Spontanous Breathing  Post-op Pain: none  Post-op Assessment: Post-op Vital signs reviewed, Patient's Cardiovascular Status Stable, Respiratory Function Stable, Patent Airway, No signs of Nausea or vomiting and Pain level controlled  Post-op Vital Signs: stable  Last Vitals:  Filed Vitals:   06/27/13 0536  BP: 171/65  Pulse: 82  Temp: 36.4 C  Resp: 20    Complications: No apparent anesthesia complications

## 2013-06-27 NOTE — Anesthesia Procedure Notes (Signed)
Procedure Name: LMA Insertion Date/Time: 06/27/2013 8:41 AM Performed by: Trixie Deis A Pre-anesthesia Checklist: Patient identified, Timeout performed, Suction available, Emergency Drugs available and Patient being monitored Patient Re-evaluated:Patient Re-evaluated prior to inductionOxygen Delivery Method: Circle system utilized Preoxygenation: Pre-oxygenation with 100% oxygen Intubation Type: IV induction Ventilation: Mask ventilation without difficulty LMA: LMA inserted LMA Size: 5.0 Number of attempts: 1 Placement Confirmation: positive ETCO2,  ETT inserted through vocal cords under direct vision and breath sounds checked- equal and bilateral Tube secured with: Tape Dental Injury: Teeth and Oropharynx as per pre-operative assessment

## 2013-06-27 NOTE — H&P (View-Only) (Signed)
ELECTROPHYSIOLOGY CONSULT NOTE  Patient ID: Anthony Bullock, MRN: [id number], DOB/AGE: 52-Jul-1962 52 y.o. Admit date: (Not on file) Date of Consult: 06/11/2013  Primary Physician: [name], MD Primary Cardiologist: KN Chief Complaint:  ICD   HPI Anthony Bullock is a 53 y.o. male  Referred for consideration of ICD  He has hx of CAD and nonischemic cardiomyopathy identified 12/14 at which time EF 25 %   Repeat Echo 25% 4/15  He is noticed shortness of breath with heavy exertion. He has peripheral edema which is variable. He denies nocturnal dyspnea. He's had no syncope. He does have tachycardia palpitations which are relatively brief, i.e. less than 5-10 seconds. They're associated with some lightheadedness.  He has class 2-3 Sx  ACE Rx d/c 2/2 renal insufficiencyl;  He is on betablockers      Past Medical History  Diagnosis Date  . Allergic rhinitis   . Diabetes mellitus type II   . Hyperlipidemia   . Hypertension   . Urethral stricture   . Kidney stones   . Myocardial infarction     "stress test showed light heart attack" on 01/16/13  . Chronic systolic heart failure     a. ECHO (12/2012) EF 25-30%, HK entireanteroseptal myocardium b. Cath 01/31/13 RA 6, RV 48/5/7, PA 47/22 (33), PCW 20, Fick CO/CI 5.2/2.5, PVR 2.5      Surgical History:  Past Surgical History  Procedure Laterality Date  . Reapea urethral surgery for recurrent obstruction  2011  . Total knee arthroplasty  2007  . Left a nd right heart cath  01/30/2013    DR BENSIHMON     Home Meds: Prior to Admission medications   Medication Sig Start Date End Date Taking? Authorizing Provider  amLODipine (NORVASC) 2.5 MG tablet Take 1 tablet (2.5 mg total) by mouth daily. 05/06/13   Dorothy Spark, MD  aspirin 81 MG tablet Take 162 mg by mouth daily.     Historical Provider, MD  atorvastatin (LIPITOR) 80 MG tablet Take 1 tablet (80 mg total) by mouth at bedtime. 04/02/13   Dorothy Spark, MD  azelastine  (ASTELIN) 137 MCG/SPRAY nasal spray Place 2 sprays into both nostrils 3 (three) times daily as needed for rhinitis. Use in each nostril as directed 04/09/13   [name], MD  carvedilol (COREG) 25 MG tablet Take 1 tablet (25 mg total) by mouth 2 (two) times daily with a meal. 05/27/13   Dorothy Spark, MD  clotrimazole-betamethasone (LOTRISONE) cream Apply 1 application topically 2 (two) times daily. 04/09/13   [name], MD  fenofibrate 160 MG tablet Take 160 mg by mouth daily.    Historical Provider, MD  fexofenadine (ALLEGRA) 180 MG tablet Take 180 mg by mouth daily.    Historical Provider, MD  furosemide (LASIX) 40 MG tablet Take 1 tablet (40 mg total) by mouth every other day. 04/02/13   Dorothy Spark, MD  gabapentin (NEURONTIN) 300 MG capsule Take 1 capsule (300 mg total) by mouth 3 (three) times daily. 11/10/11   [name], MD  Insulin Glargine (LANTUS SOLOSTAR) 100 UNIT/ML Solostar Pen INJECT 100 UNITS UNDER THE SKIN DAILY AS DIRECTED 05/02/13   [name], MD  insulin lispro (HUMALOG) 100 UNIT/ML injection Inject 14 Units into the skin 3 (three) times daily with meals as needed for high blood sugar.  10/29/12 10/29/13  [name], MD  Olopatadine HCl (PATADAY) 0.2 % SOLN Place 1 drop into both  eyes daily as needed (allergies).    Historical Provider, MD  saxagliptin HCl (ONGLYZA) 5 MG TABS tablet Take 1 tablet (5 mg total) by mouth daily. 10/29/12   [name], MD     Allergies:  Allergies  Allergen Reactions  . Morphine Sulfate Rash    REACTION: Itches all over, red spots    History   Social History  . Marital Status: Married    Spouse Name: N/A    Number of Children: N/A  . Years of Education: N/A   Occupational History  . Not on file.   Social History Main Topics  . Smoking status: Former Smoker -- 2.00 packs/day for 32 years    Types: Cigarettes    Quit date: 05/11/2010  . Smokeless tobacco: Not on file  . Alcohol Use: No   . Drug Use: No  . Sexual Activity: Not on file   Other Topics Concern  . 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 Alcohol abuse Other   . Heart disease Other   . Diabetes Other   . Stroke Other   . Anemia Sister      ROS:  Please see the history of present illness.     All other systems reviewed and negative.    Physical Exam: Blood pressure 150/73, pulse 72, height 5\' 11"  (1.803 m), weight 209 lb (94.802 kg). General: Well developed, well nourished male in no acute distress. Head: Normocephalic, atraumatic, sclera non-icteric, no xanthomas, nares are without discharge. EENT: normal Lymph Nodes:  none Back: without scoliosis/kyphosis, no CVA tendersness Neck: Negative for carotid bruits. JVD not elevated. Lungs: Clear bilaterally to auscultation without wheezes, rales, or rhonchi. Breathing is unlabored. Heart: RRR with S1 S2. 2/6 systolic murmur , rubs, or gallops appreciated. Abdomen: Soft, non-tender, non-distended with normoactive bowel sounds. No hepatomegaly. No rebound/guarding. No obvious abdominal masses. Msk:  Strength and tone appear normal for age. Extremities: No clubbing or cyanosis. No edema.  Distal pedal pulses are 2+ and equal bilaterally. Skin: Warm and Dry Neuro: Alert and oriented X 3. CN III-XII intact Grossly normal sensory and motor function . Psych:  Responds to questions appropriately with a normal affect.      Labs: Cardiac Enzymes No results found for this basename: CKTOTAL, CKMB, TROPONINI,  in the last 72 hours CBC Lab Results  Component Value Date   WBC 5.3 04/16/2013   HGB 10.2* 04/16/2013   HCT 29.8* 04/16/2013   MCV 78.3 04/16/2013   PLT 188.0 04/16/2013   PROTIME: No results found for this basename: LABPROT, INR,  in the last 72 hours Chemistry No results found for this basename: NA, K, CL, CO2, BUN, CREATININE, CALCIUM, LABALBU, PROT, BILITOT,  ALKPHOS, ALT, AST, GLUCOSE,  in the last 168 hours Lipids Lab Results  Component Value Date   CHOL 165 11/30/2012   HDL 45.70 11/30/2012   LDLCALC 104* 11/30/2012   TRIG 77.0 11/30/2012   BNP Pro B Natriuretic peptide (BNP)  Date/Time Value Ref Range Status  01/16/2013  9:57 AM 560.0* 0.0 - 100.0 pg/mL Final  11/30/2012  9:41 AM 544.0* 0.0 - 100.0 pg/mL Final   Miscellaneous No results found for this basename: DDIMER    Radiology/Studies:  No results found.  EKG:  Sinus rhythm at 72 intervals 15/10/38 Nonspecific T wave changes  Assessment and Plan:   Nonischemic cardiac  myopathy  Congestive heart failure-chronic-systolic class 2-3  Renal insufficiency grade 3   Hypertension  Tachypalpitations  The patient has persistent left ventricular dysfunction despite guidelines directed medical therapy. He has class 2-3 heart failure and renal insufficiency the latter having an impact on thoughts regarding mode of ICD insertion. Concerned his other physicians is that with renal insufficiency a transvenous device has the potential to impair venous access for dialysis in his young man. Hence, we recommend consideration for subcutaneous ICD so to avoid obstruction of venous access.  We have discussed the benefits and risks associated with ICD insertion as well as the differences between subcutaneous ICD and transvenous ICD related to battery longevity , long-term outcome data, infection implications as well as issues related to venous obstruction from transvenous access. He would like to consider subcutaneous ICD. We will plan to screen him.   We will increase his amlodipine 2.5--5 mg.  Mechanism of his palpitations are somewhat worrisome given his left ventricular dysfunction. They could represent ventricular or atrial arrhythmias.     Deboraha Sprang

## 2013-06-27 NOTE — Interval H&P Note (Signed)
ICD Criteria  Current LVEF:25% ;Obtained > or = 1 month ago and < or = 3 months ago.  NYHA Functional Classification: Class II  Heart Failure History:  Yes, Duration of heart failure since onset is 3 to 9 months  Non-Ischemic Dilated Cardiomyopathy History:  Yes, timeframe is 3 to 9 months  Atrial Fibrillation/Atrial Flutter:  No.  Ventricular Tachycardia History:  No.  Cardiac Arrest History:  No  History of Syndromes with Risk of Sudden Death:  No.  Previous ICD:  No.  Electrophysiology Study: No.  Prior MI: No.  PPM: No.  OSA:  No  Patient Life Expectancy of >=1 year: Yes.  Anticoagulation Therapy:  Patient is NOT on anticoagulation therapy.   Beta Blocker Therapy:  Yes.   Ace Inhibitor/ARB Therapy:  No, Reason not on Ace Inhibitor/ARB therapy:  renal insufficiency  History and Physical Interval Note:  06/27/2013 7:30 AM  Anthony Bullock  has presented today for surgery, with the diagnosis of CM  The various methods of treatment have been discussed with the patient and family. After consideration of risks, benefits and other options for treatment, the patient has consented to  Procedure(s): SUB Q ICD (N/A) as a surgical intervention .  The patient's history has been reviewed, patient examined, no change in status, stable for surgery.  I have reviewed the patient's chart and labs.  Questions were answered to the patient's satisfaction.     Virl Axe

## 2013-06-27 NOTE — Transfer of Care (Signed)
Immediate Anesthesia Transfer of Care Note  Patient: Anthony Bullock  Procedure(s) Performed: Procedure(s): SUB Q ICD (N/A)  Patient Location: PACU  Anesthesia Type:General  Level of Consciousness: awake, alert  and oriented  Airway & Oxygen Therapy: Patient Spontanous Breathing and Patient connected to nasal cannula oxygen  Post-op Assessment: Report given to PACU RN, Post -op Vital signs reviewed and stable and Patient moving all extremities  Post vital signs: Reviewed and stable  Complications: No apparent anesthesia complications

## 2013-06-27 NOTE — Anesthesia Preprocedure Evaluation (Addendum)
Anesthesia Evaluation  Patient identified by MRN, date of birth, ID band Patient awake    Reviewed: Allergy & Precautions, H&P , NPO status , Patient's Chart, lab work & pertinent test results  Airway Mallampati: II TM Distance: >3 FB Neck ROM: Full    Dental  (+) Teeth Intact, Dental Advisory Given   Pulmonary COPDformer smoker,          Cardiovascular hypertension, + CAD, + Past MI and +CHF     Neuro/Psych  Neuromuscular disease    GI/Hepatic   Endo/Other  diabetes, Type 2, Insulin Dependent  Renal/GU CRF and Renal InsufficiencyRenal disease     Musculoskeletal   Abdominal   Peds  Hematology  (+) anemia ,   Anesthesia Other Findings   Reproductive/Obstetrics                          Anesthesia Physical Anesthesia Plan  ASA: III  Anesthesia Plan: MAC and General   Post-op Pain Management:    Induction: Intravenous  Airway Management Planned: Mask, LMA and Oral ETT  Additional Equipment:   Intra-op Plan:   Post-operative Plan:   Informed Consent: I have reviewed the patients History and Physical, chart, labs and discussed the procedure including the risks, benefits and alternatives for the proposed anesthesia with the patient or authorized representative who has indicated his/her understanding and acceptance.     Plan Discussed with:   Anesthesia Plan Comments:         Anesthesia Quick Evaluation

## 2013-06-28 ENCOUNTER — Ambulatory Visit (HOSPITAL_COMMUNITY): Payer: BC Managed Care – PPO

## 2013-06-28 DIAGNOSIS — I5022 Chronic systolic (congestive) heart failure: Secondary | ICD-10-CM | POA: Diagnosis not present

## 2013-06-28 DIAGNOSIS — I428 Other cardiomyopathies: Secondary | ICD-10-CM

## 2013-06-28 LAB — GLUCOSE, CAPILLARY: Glucose-Capillary: 73 mg/dL (ref 70–99)

## 2013-06-28 MED ORDER — HYDROCODONE-ACETAMINOPHEN 5-325 MG PO TABS: 1.0000 | ORAL_TABLET | Freq: Four times a day (QID) | ORAL | Status: DC | PRN

## 2013-06-28 NOTE — Progress Notes (Signed)
       Patient Name: Anthony Bullock      SUBJECTIVE: with mini mal pain  Past Medical History  Diagnosis Date  . Allergic rhinitis   . Diabetes mellitus type II   . Hyperlipidemia   . Hypertension   . Urethral stricture   . Kidney stones   . Myocardial infarction     "stress test showed light heart attack" on 01/16/13  . Chronic systolic heart failure     a. ECHO (12/2012) EF 25-30%, HK entireanteroseptal myocardium b. Cath 01/31/13 RA 6, RV 48/5/7, PA 47/22 (33), PCW 20, Fick CO/CI 5.2/2.5, PVR 2.5  . CHF (congestive heart failure)     Scheduled Meds:  Scheduled Meds: . amLODipine  2.5 mg Oral Daily  . aspirin EC  162 mg Oral Daily  . atorvastatin  80 mg Oral QHS  . carvedilol  25 mg Oral BID WC  . fenofibrate  160 mg Oral Daily  . furosemide  40 mg Oral QODAY  . gabapentin  300 mg Oral TID  . insulin glargine  50 Units Subcutaneous QHS  . linagliptin  5 mg Oral Daily  . loratadine  10 mg Oral Daily  . mupirocin ointment   Nasal BID  . olopatadine  1 drop Both Eyes BID   Continuous Infusions:  acetaminophen, azelastine, fentaNYL, HYDROcodone-acetaminophen, ondansetron (ZOFRAN) IV    PHYSICAL EXAM Filed Vitals:   06/27/13 1502 06/27/13 1600 06/27/13 2100 06/28/13 0500  BP: 162/67  165/58 159/70  Pulse: 88 85 73 77  Temp: 98.2 F (36.8 C)  98.2 F (36.8 C) 98 F (36.7 C)  TempSrc: Oral  Oral Oral  Resp: 22  20 20   Height:      Weight:    206 lb 6.4 oz (93.622 kg)  SpO2: 98%  96% 99%    Well developed and nourished in no acute distress HENT normal Neck supple with JVP-flat Clear wiounds without  Regular rate and rhythm, no murmurs or gallops Abd-soft with active BS No Clubbing cyanosis edema Skin-warm and dry A & Oriented  Grossly normal sensory and motor function   TELEMETRY: Reviewed telemetry pt in NSR:    Intake/Output Summary (Last 24 hours) at 06/28/13 1044 Last data filed at 06/28/13 0900  Gross per 24 hour  Intake    360 ml  Output     500 ml  Net   -140 ml    LABS: Basic Metabolic Panel: No results found for this basename: NA, K, CL, CO2, GLUCOSE, BUN, CREATININE, CALCIUM, MG, PHOS,  in the last 168 hours Cardiac Enzymes: No results found for this basename: CKTOTAL, CKMB, CKMBINDEX, TROPONINI,  in the last 72 hours CBC: No results found for this basename: WBC, NEUTROABS, HGB, HCT, MCV, PLT,  in the last 168 hours PROTIME: No results found for this basename: LABPROT, INR,  in the last 72 hours Liver Function Tests: No results found for this basename: AST, ALT, ALKPHOS, BILITOT, PROT, ALBUMIN,  in the last 72 hours No results found for this basename: LIPASE, AMYLASE,  in the last 72 hours BNP: BNP (last 3 results)  Recent Labs  11/30/12 0941 01/16/13 0957  PROBNP 544.0* 560.0*     Device Interrogation: normal device function  ASSESSMENT AND PLAN:  Active Problems:   Other primary cardiomyopathies  S/p Subcuatenous ICD implant Instructions given  Discharge to home vicodin no 10 at discharge Signed, Virl Axe MD  06/28/2013

## 2013-06-28 NOTE — Discharge Summary (Signed)
CARDIOLOGY DISCHARGE SUMMARY   Patient ID: Anthony Bullock MRN: [id number] DOB/AGE: Aug 23, 1960 53 y.o.  Admit date: 06/27/2013 Discharge date: 06/28/2013  PCP: [name], MD Primary Cardiologist: KN/SK  Primary Discharge Diagnosis:   Other primary cardiomyopathies Secondary Discharge Diagnosis:    DIABETES MELLITUS, TYPE II  Procedures: Subcutaneous ICD implant  Hospital Course: Anthony Bullock is a 53 y.o. male with a history of cardiomyopathy. Dr. Meda Coffee has been managing his volume status carefully. He was having palpitations and there was concern for ventricular arrhythmias because his EF has remained low despite maximal medical therapy. Dr. Caryl Comes saw Mr. Emminger and recommended ICD implantation. He came to the hospital for the procedure on 06/27/2013.  He had a subcutaneous ICD implanted without immediate complication. This was performed under general anesthesia. He tolerated the procedure well.  On 06/19 he was seen by Dr. Caryl Comes and all data were reviewed. He was having some pain from the incision site but it was without hematoma or bleeding. This will be treated with a short-term prescription for hydrocodone. There was no significant ecchymosis. The chest x-ray showed no pneumothorax and no acute disease. No further inpatient workup is indicated and he is considered stable for discharge, to follow up as an outpatient.  Labs:  Lab Results  Component Value Date   WBC 4.7 06/20/2013   HGB 10.3* 06/20/2013   HCT 30.8* 06/20/2013   MCV 83.4 06/20/2013   PLT 198.0 06/20/2013     Radiology: Dg Chest 2 View 06/28/2013   CLINICAL DATA:  Chest pain.  Postop from defibrillator placement.  EXAM: CHEST  2 VIEW  COMPARISON:  01/20/2013  FINDINGS: New implanted defibrillator is seen with lead in the anterior chest wall soft tissues. No evidence of pneumothorax. Tiny left pleural effusion or thickening seen as well as mild left basilar atelectasis versus scarring. No evidence of pulmonary  edema or airspace disease. Heart size is stable and within normal limits.  IMPRESSION: New defibrillator placement in left anterior chest wall soft tissues. No evidence of pneumothorax.  Tiny left pleural effusion versus pleural thickening, and mild left basilar atelectasis versus scarring.   Electronically Signed   By: Earle Gell M.D.   On: 06/28/2013 08:21   EKG: 06/28/2013 Sinus rhythm Vent. rate 72 BPM PR interval 146 ms QRS duration 104 ms QT/QTc 406/444 ms P-R-T axes 41 5 40  FOLLOW UP PLANS AND APPOINTMENTS Allergies  Allergen Reactions  . Morphine Sulfate Rash    REACTION: Itches all over, red spots     Medication List         amLODipine 2.5 MG tablet  Commonly known as:  NORVASC  Take 1 tablet (2.5 mg total) by mouth daily.     aspirin EC 81 MG tablet  Take 162 mg by mouth daily.     atorvastatin 80 MG tablet  Commonly known as:  LIPITOR  Take 1 tablet (80 mg total) by mouth at bedtime.     azelastine 0.1 % nasal spray  Commonly known as:  ASTELIN  Place 2 sprays into both nostrils 3 (three) times daily as needed for rhinitis. Use in each nostril as directed     carvedilol 25 MG tablet  Commonly known as:  COREG  Take 1 tablet (25 mg total) by mouth 2 (two) times daily with a meal.     clotrimazole-betamethasone cream  Commonly known as:  LOTRISONE  Apply 1 application topically 2 (two) times daily.  fenofibrate 160 MG tablet  Take 1 tablet (160 mg total) by mouth daily.     fexofenadine 180 MG tablet  Commonly known as:  ALLEGRA  Take 180 mg by mouth daily.     furosemide 40 MG tablet  Commonly known as:  LASIX  Take 1 tablet (40 mg total) by mouth every other day.     gabapentin 300 MG capsule  Commonly known as:  NEURONTIN  Take 1 capsule (300 mg total) by mouth 3 (three) times daily.     HYDROcodone-acetaminophen 5-325 MG per tablet  Commonly known as:  NORCO/VICODIN  Take 1 tablet by mouth every 6 (six) hours as needed for moderate pain.       insulin glargine 100 UNIT/ML injection  Commonly known as:  LANTUS  Inject 0-50 Units into the skin daily as needed (for sliding scale).     insulin lispro 100 UNIT/ML injection  Commonly known as:  HUMALOG  Inject 0-14 Units into the skin 3 (three) times daily as needed for high blood sugar (per sliding scale).     PATADAY 0.2 % Soln  Generic drug:  Olopatadine HCl  Place 1 drop into both eyes daily as needed (for allergies).     saxagliptin HCl 5 MG Tabs tablet  Commonly known as:  ONGLYZA  Take 1 tablet (5 mg total) by mouth daily.     traMADol 50 MG tablet  Commonly known as:  ULTRAM  Take 0.5 tablets (25 mg total) by mouth every 12 (twelve) hours as needed for moderate pain.        Discharge Instructions   (HEART FAILURE PATIENTS) Call MD:  Anytime you have any of the following symptoms: 1) 3 pound weight gain in 24 hours or 5 pounds in 1 week 2) shortness of breath, with or without a dry hacking cough 3) swelling in the hands, feet or stomach 4) if you have to sleep on extra pillows at night in order to breathe.    Complete by:  As directed      Diet - low sodium heart healthy    Complete by:  As directed      Diet Carb Modified    Complete by:  As directed      Increase activity slowly    Complete by:  As directed           Follow-up Information   Follow up with [facility] On 07/08/2013. (At 10:30 AM for wound check)    Specialty:  Cardiology   Contact information:   [address] [place] [address] [phone]      Follow up with Dorothy Spark, MD On 09/02/2013. (At 8:00 AM)    Specialty:  Cardiology   Contact information:   [place] [address] [phone]       Follow up with Virl Axe, MD On 10/02/2013. (At 12:00 noon)    Specialty:  Cardiology   Contact information:   [address] [address] [phone]       BRING ALL MEDICATIONS  WITH YOU TO FOLLOW UP APPOINTMENTS  Time spent with patient to include physician time: 36 min Signed: Rosaria Ferries, PA-C 06/28/2013, 12:23 PM Co-Sign MD

## 2013-06-28 NOTE — Discharge Instructions (Signed)
° °  Supplemental Discharge Instructions for  Pacemaker/Defibrillator Patients  Activity No heavy lifting or vigorous activity with your left arm for 6 to 8 weeks.  Do not raise your left arm above your head for one week.  Gradually raise your affected arm as drawn below.           06/22                      06/23                       06/24                      06/25       NO DRIVING for 1 week; you may begin driving on [id number]. WOUND CARE   Keep the wound area clean and dry.  You may shower but no soaking in tub bath or swimming pool for 10-14 days until wound completely healed.    The Dermabond (glue) on your wound will fall off on its own; do not pull it off.  No bandage is needed on the site.  DO NOT apply any creams, oils, or ointments to the wound area.   If you notice any drainage or discharge from the wound, any swelling or bruising at the site, or you develop a fever > 101? F after you are discharged home, call the office at once.  Special Instructions   You are still able to use cellular telephones; use the ear opposite the side where you have your pacemaker/defibrillator.  Avoid carrying your cellular phone near your device.   When traveling through airports, show security personnel your identification card to avoid being screened in the metal detectors.  Ask the security personnel to use the hand wand.   Avoid arc welding equipment, MRI testing (magnetic resonance imaging), TENS units (transcutaneous nerve stimulators).  Call the office for questions about other devices.   Avoid electrical appliances that are in poor condition or are not properly grounded.   Microwave ovens are safe to be near or to operate.  Additional information for defibrillator patients should your device go off:   If your device goes off ONCE and you feel fine afterward, notify the device clinic nurses.   If your device goes off ONCE and you do not feel well afterward, call 911.   If your device goes  off TWICE, call 911.   If your device goes off THREE times in one day, call 911.  DO NOT DRIVE YOURSELF OR A FAMILY MEMBER WITH A DEFIBRILLATOR TO THE HOSPITAL--CALL 911.

## 2013-06-28 NOTE — Progress Notes (Signed)
Pt discharged to home per MD order. Pt received and reviewed all discharge instructions and medication information including follow-up appointments and prescription information. Pt verbalized understanding. Pt alert and oriented at discharge with no complaints of pain. Pt IV and telemetry box removed prior to discharge. Pt ambulated to private vehicle per pt request. Lenna Sciara

## 2013-07-01 NOTE — Op Note (Signed)
**Note Bullock-Identified via Obfuscation** [name], Anthony Bullock NO.:  [id number]  MEDICAL RECORD NO.:  [id number]  LOCATION:  [id number]                        FACILITY:  [facility]  PHYSICIAN:  Deboraha Sprang, MD, FACCDATE OF BIRTH:  1961-01-04  DATE OF PROCEDURE:  06/27/2013 DATE OF DISCHARGE:  06/28/2013                              OPERATIVE REPORT   PREOPERATIVE DIAGNOSES:  Nonischemic cardiomyopathy, renal insufficiency, and congestive heart failure.  POSTOPERATIVE DIAGNOSES:  Nonischemic cardiomyopathy, renal insufficiency, and congestive heart failure.  PROCEDURES:  Subcutaneous implantable defibrillator implantation with high-voltage defibrillation threshold testing.  DESCRIPTION OF PROCEDURE:  Following obtaining informed consent, the patient was brought to the electrophysiology laboratory and placed on the fluoroscopic table in the supine position.  The patient was mapped fluoroscopically and then prepped from his neck to his navel and from his right nipple line to the left posterior axillary line.  He was submitted for general anesthesia under the care of Dr. Tamala Julian.  Following this, local anesthesia was injected in the submammary incision and a pocket was formed in this space after the incision was carried down to the layer of the fascia over the subcostal musculature.  Care was taken to remove the surface fat.  This pocket was constructed down to the posterior axillary line.  We then made a subxiphoid incision, which turned out to be about a centimeter caudal to the xiphoid process, and so we angled cephalad to put our anchoring sutures.  We then tracked the lead from the subxiphoid incision to the inframammary incision and through the defibrillator lead model 3010, serial [id number].  This was secured along with subxiphoid, in the subxiphoid area at the caudal aspect of the sternum.  We then measured to the sternal notch area and made a vertical incision in this space that allowed for then  tunneling and then through pulling of the lead into the region of the sternal notch and the lead was secured here as it had been at the subxiphoid area with a 2-0 suture.  These incisions were then closed at the base layer and the lead was attached to a [employer] defibrillator generator model 1010A, serial [id number].  This device was placed in the lateral subcutaneous pocket.  Surgicel was placed and the other pocket also having been copiously irrigated with antibiotic containing saline solution.  This incision was also closed in the base layer and care was taken to expel whatever air could be expelled.  Defibrillation threshold testing was then undertaken.  Ventricular fibrillation was induced.  After a duration of about 20 seconds, a 65 joule shock was delivered through a measured resistance of 81, terminating ventricular fibrillation and restoring sinus rhythm.  At this point, all 3 incisions were closed in their final layers. Dermabond was applied.  The patient tolerated the procedure without apparent complication. Needle counts, sponge counts, and instrument counts were correct at the end of the procedure according to the staff.  The patient was then transferred to the recovery area in stable condition.     Deboraha Sprang, MD, [facility]     SCK/MEDQ  D:  07/01/2013  T:  07/01/2013  Job:  [id number]

## 2013-07-02 ENCOUNTER — Other Ambulatory Visit: Payer: Self-pay | Admitting: Family Medicine

## 2013-07-02 ENCOUNTER — Encounter (HOSPITAL_COMMUNITY): Payer: Self-pay | Admitting: *Deleted

## 2013-07-04 ENCOUNTER — Ambulatory Visit (INDEPENDENT_AMBULATORY_CARE_PROVIDER_SITE_OTHER): Payer: BC Managed Care – PPO | Admitting: *Deleted

## 2013-07-04 ENCOUNTER — Telehealth: Payer: Self-pay | Admitting: Cardiology

## 2013-07-04 DIAGNOSIS — I428 Other cardiomyopathies: Secondary | ICD-10-CM

## 2013-07-04 LAB — MDC_IDC_ENUM_SESS_TYPE_INCLINIC

## 2013-07-04 NOTE — Telephone Encounter (Signed)
Called and informed pt that MD advised that if pain was tolerable that he could wait until his Monday appt. Pt stated that when he experienced the burning sensation that it was a 9. Pt voiced that he would like to come today to have device checked. Pt aware and agreed to appt today at 3:30 for [place] rep to interrogate device. I informed pt that if anything was going on that was concerning that MD would come in and talk with him. I also informed pt that if he was getting shocked that it would feel like a sledge hammer was hitting him in his chest. Pt verbalized understanding.

## 2013-07-04 NOTE — Progress Notes (Signed)
Wound check icd in clinic. Normal device function. No episodes recorded. Site well healed with minimal swelling. Pt complains of burning at site with pain. SK evaluated. Pt scheduled for 07-08-13 @ 1030 and keeping appt so device can be tested with tens unit. Wound check performed.

## 2013-07-04 NOTE — Telephone Encounter (Signed)
Pt called and stated that he had a defibaltor put in on Thursday 06-27-2013. PT states that he feels like he is having a burning sensation in his back and that it feels like he may be getting shocked but he is not sure. Pt states that he feels fine other wise no shortness of breath and no chest pain. Pt is aware of appt scheduled for 6-29 at 10:30 AM. I informed pt that I would send a note to MD.

## 2013-07-05 ENCOUNTER — Encounter: Payer: Self-pay | Admitting: Family Medicine

## 2013-07-05 ENCOUNTER — Ambulatory Visit (INDEPENDENT_AMBULATORY_CARE_PROVIDER_SITE_OTHER): Payer: BC Managed Care – PPO | Admitting: Family Medicine

## 2013-07-05 VITALS — BP 132/78 | HR 72 | Temp 97.9°F | Wt 209.0 lb

## 2013-07-05 DIAGNOSIS — E1165 Type 2 diabetes mellitus with hyperglycemia: Principal | ICD-10-CM

## 2013-07-05 DIAGNOSIS — E1129 Type 2 diabetes mellitus with other diabetic kidney complication: Secondary | ICD-10-CM

## 2013-07-05 DIAGNOSIS — E1142 Type 2 diabetes mellitus with diabetic polyneuropathy: Secondary | ICD-10-CM

## 2013-07-05 DIAGNOSIS — I10 Essential (primary) hypertension: Secondary | ICD-10-CM

## 2013-07-05 DIAGNOSIS — E1149 Type 2 diabetes mellitus with other diabetic neurological complication: Secondary | ICD-10-CM

## 2013-07-05 MED ORDER — GABAPENTIN 300 MG PO CAPS: 300.0000 mg | ORAL_CAPSULE | Freq: Three times a day (TID) | ORAL | Status: DC

## 2013-07-05 MED ORDER — FENOFIBRATE 160 MG PO TABS: 160.0000 mg | ORAL_TABLET | Freq: Every day | ORAL | Status: DC

## 2013-07-05 NOTE — Progress Notes (Signed)
Pre visit review using our clinic review tool, if applicable. No additional management support is needed unless otherwise documented below in the visit note. 

## 2013-07-05 NOTE — Progress Notes (Signed)
Subjective:    Patient ID: Anthony Bullock, male    DOB: [birth year], 53 y.o.   MRN: [id number]  HPI Followup type 2 diabetes. History of poor control. This has been improving lately. Most recent A1c 8.4%. Takes Lantus 50 units daily and has been taking Humalog but does not have a current sliding scale. He generally takes 0 to 14 units. Had a couple of hypoglycemic episodes.  He has history of systolic heart failure and had recent ICD implanted last week without difficulty. He has chronic kidney disease with recent creatinine 2.0. This has been stable. Medications reviewed. Compliant with all with exception of not consistently taking his Humalog insulin as above.  Past Medical History  Diagnosis Date  . Allergic rhinitis   . Diabetes mellitus type II   . Hyperlipidemia   . Hypertension   . Urethral stricture   . Kidney stones   . Myocardial infarction     "stress test showed light heart attack" on 01/16/13  . Chronic systolic heart failure     a. ECHO (12/2012) EF 25-30%, HK entireanteroseptal myocardium b. Cath 01/31/13 RA 6, RV 48/5/7, PA 47/22 (33), PCW 20, Fick CO/CI 5.2/2.5, PVR 2.5  . CHF (congestive heart failure)   . Non-ischemic cardiomyopathy   . Renal insufficiency    Past Surgical History  Procedure Laterality Date  . Reapea urethral surgery for recurrent obstruction  2011  . Total knee arthroplasty  2007  . Left a nd right heart cath  01/30/2013    DR BENSIHMON  . Cardiac defibrillator placement  06/27/2013    Sub Q       BY DR Caryl Comes    reports that he quit smoking about 3 years ago. His smoking use included Cigarettes. He has a 64 pack-year smoking history. He has never used smokeless tobacco. He reports that he does not drink alcohol or use illicit drugs. family history includes Alcohol abuse in his other; Anemia in his sister; Diabetes in his other; Heart disease in his other; Stroke in his other. Allergies  Allergen Reactions  . Morphine Sulfate Rash    REACTION:  Itches all over, red spots      Review of Systems  Constitutional: Negative for fatigue.  Eyes: Negative for visual disturbance.  Respiratory: Negative for cough, chest tightness and shortness of breath.   Cardiovascular: Negative for chest pain, palpitations and leg swelling.  Endocrine: Negative for polydipsia and polyuria.  Neurological: Negative for dizziness, syncope, weakness, light-headedness and headaches.       Objective:   Physical Exam  Constitutional: He is oriented to person, place, and time. He appears well-developed and well-nourished.  HENT:  Right Ear: External ear normal.  Left Ear: External ear normal.  Mouth/Throat: Oropharynx is clear and moist.  Eyes: Pupils are equal, round, and reactive to light.  Neck: Neck supple. No thyromegaly present.  Cardiovascular: Normal rate and regular rhythm.   Pulmonary/Chest: Effort normal and breath sounds normal. No respiratory distress. He has no wheezes. He has no rales.  Musculoskeletal: He exhibits no edema.  Neurological: He is alert and oriented to person, place, and time.  Skin:  He has multiple incision sites from recent ICD implantation. These are healing well no signs of secondary infection          Assessment & Plan:  Type 2 diabetes with complications of peripheral neuropathy and nephropathy. History of poor control. Sliding scale given for Humalog. Continue Lantus 50 units once daily Will check A1C  in 2 months.  Pt declines labs today. Hypertension stable.  Continue monitoring. Diabetic neuropathy symptoms stable on Gabapentin

## 2013-07-05 NOTE — Patient Instructions (Addendum)
Take Humalog by sliding scale as follows:  <100     0 Humalog 100-130  5 units 130-160  7 units 160-190  9 units 190-220  11 units >220       14 units

## 2013-07-08 ENCOUNTER — Ambulatory Visit: Payer: BC Managed Care – PPO

## 2013-07-08 ENCOUNTER — Ambulatory Visit (INDEPENDENT_AMBULATORY_CARE_PROVIDER_SITE_OTHER): Payer: BC Managed Care – PPO | Admitting: *Deleted

## 2013-07-08 ENCOUNTER — Other Ambulatory Visit: Payer: Self-pay | Admitting: Internal Medicine

## 2013-07-08 DIAGNOSIS — I428 Other cardiomyopathies: Secondary | ICD-10-CM

## 2013-07-08 LAB — MDC_IDC_ENUM_SESS_TYPE_INCLINIC
Implantable Pulse Generator Model: 1010
Implantable Pulse Generator Serial Number: 18866

## 2013-07-08 NOTE — Progress Notes (Signed)
Patient tested this am by industry for any interference while using a TENS unit on right knee.  No interference noted.  Wound healing well without redness or edema.  Follow up as scheduled.

## 2013-07-15 ENCOUNTER — Telehealth: Payer: Self-pay | Admitting: Cardiology

## 2013-07-15 NOTE — Telephone Encounter (Signed)
Will forward to medical records 

## 2013-07-15 NOTE — Telephone Encounter (Signed)
F/u   Pt need same forms faxed to his job Attn: Jeannetta Nap [phone].

## 2013-07-15 NOTE — Telephone Encounter (Signed)
Called stating he needs a letter sent to [employer] and also to his work (att: Miranda) stating when he had defibrillator placed, how long he will be out of work and the appointment dates he has scheduled.  [employer] fax is [phone] and his work fax is [phone]. Will forward to [employer]

## 2013-07-15 NOTE — Telephone Encounter (Signed)
New message     Please fax a note saying pt had a defibulator put in on 06-27-13, how many days he will be out of work and list of future appt including 07-08-13 appt.  Fax to  [phone] [employer].

## 2013-07-16 NOTE — Telephone Encounter (Signed)
Follow up    Patient wants to know did the nurse sent the two faxes - start of his disability.

## 2013-07-16 NOTE — Telephone Encounter (Signed)
Spoke with patient who tells me FMLA paperwork is still not filled out correctly.  He states he needs following info on paperwork:  Date of implant  OV 6/29  How long out of work  Any future OV   After investigating this - it seems paperwork went to Dr. Meda Coffee and filled out. This may be why ins co keeps telling him paperwork is missing required information.  I explained to patient that I would address this and have missing info placed on paperwork and signed off by Dr. Caryl Comes. Pt is appreciative of help and agreeable to plan. I will call pt when paperwork completed.

## 2013-07-17 ENCOUNTER — Telehealth: Payer: Self-pay | Admitting: Internal Medicine

## 2013-07-17 NOTE — Telephone Encounter (Signed)
FMLA Not Completed Correctly spoke with Patsy/[employer] she is working On returning Pmt back to pt, also called spoke with Rulon Eisenmenger  HR Rep with [employer] she Faxed me a Blank copy Of FMLA for Dr.Klein to Complete. This was given to [facility] Once Completed Fax FMLA to  [place] @ [phone] P) [phone]  7.8.15/km

## 2013-07-18 NOTE — Telephone Encounter (Signed)
Notified patient that paperwork completed and Maudie Mercury in medical records will fax it out tomorrow. He is extremely thankful for all my help and Kim's.

## 2013-07-19 ENCOUNTER — Telehealth: Payer: Self-pay | Admitting: Internal Medicine

## 2013-07-19 NOTE — Telephone Encounter (Signed)
FMLA faxed to [employer] HR Rep W/ [employer] at Fax: [phone] P) [phone]  Pt asked For FMLA to be Faxed to [place], I called and Spoke With Rep at [phone] and she stated she wasn't sure Why we needed to Send this  Over and she Could not Give me the Rep Whom mr.Colombo works With So the paperwork will not be faxed To [employer]. Original Copy Of FMLA Will be mailed to Pt home address.  7.10.15/km

## 2013-07-29 ENCOUNTER — Encounter: Payer: Self-pay | Admitting: Internal Medicine

## 2013-07-30 ENCOUNTER — Encounter: Payer: Self-pay | Admitting: Internal Medicine

## 2013-08-06 ENCOUNTER — Telehealth: Payer: Self-pay | Admitting: Internal Medicine

## 2013-08-06 NOTE — Telephone Encounter (Signed)
New message          Pt needs a note stating he can return to work on 7/30 with no restrictions

## 2013-08-08 ENCOUNTER — Encounter: Payer: Self-pay | Admitting: *Deleted

## 2013-08-08 ENCOUNTER — Encounter: Payer: Self-pay | Admitting: Internal Medicine

## 2013-08-08 NOTE — Telephone Encounter (Signed)
See other telephone note.  Duplicate  This encounter was created in error - please disregard.

## 2013-08-08 NOTE — Telephone Encounter (Signed)
°  Patient needs a note that he can return to work today with NO restrictions. Please call when ready so he can pick up. If any questions please call patient.

## 2013-08-08 NOTE — Telephone Encounter (Signed)
Explained to patient that we filled out FMLA paperwork that stated he may return 6/29. He is asking for a paper that states he has no restrictions.  Patient aware letter left at front desk for pick up. He will be by today.

## 2013-08-08 NOTE — Telephone Encounter (Signed)
Anthony Bullock at 08/08/2013 9:09 AM     Status: Signed        Patient needs a note that he can return to work today with NO restrictions. Please call when ready so he can pick up. If any questions please call patient.

## 2013-08-23 ENCOUNTER — Other Ambulatory Visit: Payer: Self-pay | Admitting: Family Medicine

## 2013-08-23 DIAGNOSIS — E1165 Type 2 diabetes mellitus with hyperglycemia: Principal | ICD-10-CM

## 2013-08-23 DIAGNOSIS — E1129 Type 2 diabetes mellitus with other diabetic kidney complication: Secondary | ICD-10-CM

## 2013-08-26 ENCOUNTER — Other Ambulatory Visit (INDEPENDENT_AMBULATORY_CARE_PROVIDER_SITE_OTHER): Payer: BC Managed Care – PPO

## 2013-08-26 DIAGNOSIS — I5021 Acute systolic (congestive) heart failure: Secondary | ICD-10-CM

## 2013-08-26 LAB — BASIC METABOLIC PANEL
BUN: 37 mg/dL — ABNORMAL HIGH (ref 6–23)
CO2: 27 mEq/L (ref 19–32)
Calcium: 8.4 mg/dL (ref 8.4–10.5)
Chloride: 109 mEq/L (ref 96–112)
Creatinine, Ser: 2.6 mg/dL — ABNORMAL HIGH (ref 0.4–1.5)
GFR: 27.21 mL/min — ABNORMAL LOW (ref >60.00)
Glucose, Bld: 79 mg/dL (ref 70–99)
Potassium: 4.5 mEq/L (ref 3.5–5.1)
Sodium: 140 mEq/L (ref 135–145)

## 2013-09-02 ENCOUNTER — Ambulatory Visit (INDEPENDENT_AMBULATORY_CARE_PROVIDER_SITE_OTHER): Payer: BC Managed Care – PPO | Admitting: Cardiology

## 2013-09-02 ENCOUNTER — Encounter: Payer: Self-pay | Admitting: Cardiology

## 2013-09-02 VITALS — BP 140/68 | HR 76 | Ht 70.0 in | Wt 224.0 lb

## 2013-09-02 DIAGNOSIS — Z79899 Other long term (current) drug therapy: Secondary | ICD-10-CM

## 2013-09-02 DIAGNOSIS — E1129 Type 2 diabetes mellitus with other diabetic kidney complication: Secondary | ICD-10-CM

## 2013-09-02 MED ORDER — SPIRONOLACTONE 25 MG PO TABS: 25.0000 mg | ORAL_TABLET | Freq: Every day | ORAL | Status: DC

## 2013-09-02 NOTE — Patient Instructions (Signed)
Your physician has recommended you make the following change in your medication:   1. Start Spironolactone 25 mg 1 tablet by mouth daily.   Your physician recommends that you return for lab work in: 1 month on 10/03/13 for CMet and Hgb A1C.  Your physician recommends that you schedule a follow-up appointment in: 3 months with Dr. Meda Coffee.

## 2013-09-02 NOTE — Progress Notes (Signed)
Patient ID: Anthony Bullock, male   DOB: [birth year], 53 y.o.   MRN: [id number]    PCP: Dr. Elease Hashimoto  53 yo with history of diabetes, diabetic nephropathy, CAD, and primarily nonischemic cardiomyopathy presents for cardiology followup. Had had LHC in 1/15 with some CAD but no interventional target (looks like diabetic vascular disease).  Initial echo in 12/14 with EF 25-30%.  I reviewed his echo today: EF remains low at 25%. Weight is down 10 lbs since last appointment.  After cath, creatinine went up to 2.4, most recently back to 2.0.  Ramipril was decreased to 5 mg daily.  He denies exertional dyspnea or chest pain.  He can climb a flight of steps without problems.  No orthopnea (improved), no PND, no LE edema.  He continues to work with limited weight lifting to 30 pounds and has no problem doing that. He was seen by Dr. Rayann Heman and scheduled for an ICD implantation in April. This was cancelled because of Crea elevation 1.8 --> 2.4--> 3.0. His Lasix has been held for the last 3 weeks and now feels SOB after walking 1 flight of stairs. His weight increased from 196 to 201 lbs in 1 week. He is scheduled for CTA of the abdomen/pelvis this Wednesday followed by Nephrology and urology appointment.   09/02/2013 - the patient is doing great, no LE edema with Lasix 40 mg po daily, last Crea 2.6 on 8/17, (baseline 3.0), he has received a subcutaneous ICD on 07/08/13 by Dr Caryl Comes. He continues to work. No CP. NYHA II. patient's only complaint today but he feels tired when he wakes up and all day..  ECG: NSR, normal (QRS not widened)  PMH: 1. Cardiomyopathy: Primarily nonischemic.  Echo (12/14) with EF 25-30%.  Echo (3/15) with EF 25%, mild to moderately dilated LV, normal RV size and systolic function.  RHC/LHC (1/15) with mean RA 6, PA 47/22 mean 33, mean PCWP 20, PVR 2.5 WU, CI 2.5; 80% dLAD stenosis, 70% diffuse large D.  2. CAD: LHC (1/15) with 80% dLAD, 70% diffuse large diagonal.   3. Type II diabetes with  peripheral neuropathy and nephropathy.  4. CKD stage III: Likely related to diabetes.  5. Hyperlipidemia.   FH: No premature CAD, no SCD.   SH: Married, lives in [place]. Works at [employer].  No longer smokes, quit 2012 smoked for 36 years.  ROS: All systems reviewed and negative except as per HPI.   Current Outpatient Prescriptions  Medication Sig Dispense Refill  . amLODipine (NORVASC) 2.5 MG tablet Take 1 tablet (2.5 mg total) by mouth daily.  7 tablet  0  . aspirin EC 81 MG tablet Take 162 mg by mouth daily.      [name] atorvastatin (LIPITOR) 80 MG tablet Take 1 tablet (80 mg total) by mouth at bedtime.  90 tablet  1  . azelastine (ASTELIN) 137 MCG/SPRAY nasal spray Place 2 sprays into both nostrils 3 (three) times daily as needed for rhinitis. Use in each nostril as directed  30 mL  12  . carvedilol (COREG) 25 MG tablet Take 1 tablet (25 mg total) by mouth 2 (two) times daily with a meal.  180 tablet  0  . fenofibrate 160 MG tablet Take 1 tablet (160 mg total) by mouth daily.  90 tablet  3  . fexofenadine (ALLEGRA) 180 MG tablet Take 180 mg by mouth daily.      . furosemide (LASIX) 40 MG tablet Take 1 tablet (40 mg total) by  mouth every other day.  45 tablet  1  . gabapentin (NEURONTIN) 300 MG capsule Take 1 capsule (300 mg total) by mouth 3 (three) times daily.  270 capsule  3  . insulin glargine (LANTUS) 100 UNIT/ML injection Inject 50 Units into the skin daily.       . insulin lispro (HUMALOG) 100 UNIT/ML injection Inject 0-14 Units into the skin 3 (three) times daily as needed for high blood sugar (per sliding scale).      . Olopatadine HCl (PATADAY) 0.2 % SOLN Place 1 drop into both eyes daily as needed (for allergies).       . saxagliptin HCl (ONGLYZA) 5 MG TABS tablet Take 1 tablet (5 mg total) by mouth daily.  90 tablet  3  . traMADol (ULTRAM) 50 MG tablet Take 0.5 tablets (25 mg total) by mouth every 12 (twelve) hours as needed for moderate pain.  50 tablet  3   No current  facility-administered medications for this visit.   There were no vitals taken for this visit. General: NAD Neck: No JVD, no thyromegaly or thyroid nodule.  Lungs: Clear to auscultation bilaterally with normal respiratory effort. CV: Nondisplaced PMI.  Heart regular S1/S2, no S3/S4, no murmur.  No peripheral edema.  No carotid bruit.  Normal pedal pulses.  Abdomen: Soft, nontender, no hepatosplenomegaly, no distention.  Skin: Intact without lesions or rashes.  Neurologic: Alert and oriented x 3.  Psych: Normal affect. Extremities: No clubbing or cyanosis.   ECG: SR, new T wave inversions in the lateral leads    Assessment/Plan:  1. Chronic systolic CHF: Primarily nonischemic cardiomyopathy.  EF remains 25%.  NYHA class II-III symptoms at this point.    We will continue Lasix 40 mg po daily and add Spironolactone 25 mg po daily.  We will await recommendations from nephrology and urology and follow. -  He is not a candidate for CRT given narrow QRS.  - Continue Coreg to 25 mg bid.  - stopped ACEI given recent rise in creatinine.   2. CAD: Moderate CAD on cath, unlikely that this is the cause of cardiomyopathy.  Continue atorvastatin 80 mg and ASA 81 mg daily.   3. Hyperlipidemia: Goal LDL < 70.   4. CKD: Stage III:  Creatinine rose after cath. Its most probably a combination of chronic low cardiac output, diuretic therapy and contrast nephropathy post cath. I don't expect significant improvement with Crea better than 1.8. We will d/c ramipril and start amlodipine 2.5 mg po daily.   5. Weak peripheral pulses - claudications - we will order B/L LE arterial Duplex  Followup in 3 months with labs shortly before the visit. Check CMP and HbA1c in 1 month.  Dorothy Spark 09/02/2013 8:11 AM

## 2013-09-04 ENCOUNTER — Other Ambulatory Visit: Payer: Self-pay | Admitting: *Deleted

## 2013-09-04 MED ORDER — CARVEDILOL 25 MG PO TABS: 25.0000 mg | ORAL_TABLET | Freq: Two times a day (BID) | ORAL | Status: DC

## 2013-09-30 ENCOUNTER — Other Ambulatory Visit (INDEPENDENT_AMBULATORY_CARE_PROVIDER_SITE_OTHER): Payer: BC Managed Care – PPO

## 2013-09-30 ENCOUNTER — Telehealth: Payer: Self-pay | Admitting: *Deleted

## 2013-09-30 DIAGNOSIS — Z79899 Other long term (current) drug therapy: Secondary | ICD-10-CM

## 2013-09-30 DIAGNOSIS — I5022 Chronic systolic (congestive) heart failure: Secondary | ICD-10-CM

## 2013-09-30 DIAGNOSIS — E1165 Type 2 diabetes mellitus with hyperglycemia: Secondary | ICD-10-CM

## 2013-09-30 DIAGNOSIS — E1129 Type 2 diabetes mellitus with other diabetic kidney complication: Secondary | ICD-10-CM

## 2013-09-30 LAB — COMPREHENSIVE METABOLIC PANEL
ALT: 22 U/L (ref 0–53)
AST: 21 U/L (ref 0–37)
Albumin: 2.6 g/dL — ABNORMAL LOW (ref 3.5–5.2)
Alkaline Phosphatase: 55 U/L (ref 39–117)
BUN: 37 mg/dL — ABNORMAL HIGH (ref 6–23)
CO2: 27 mEq/L (ref 19–32)
Calcium: 8.8 mg/dL (ref 8.4–10.5)
Chloride: 111 mEq/L (ref 96–112)
Creatinine, Ser: 2.9 mg/dL — ABNORMAL HIGH (ref 0.4–1.5)
GFR: 24.41 mL/min — ABNORMAL LOW (ref >60.00)
Glucose, Bld: 211 mg/dL — ABNORMAL HIGH (ref 70–99)
Potassium: 5.3 mEq/L — ABNORMAL HIGH (ref 3.5–5.1)
Sodium: 141 mEq/L (ref 135–145)
Total Bilirubin: 0.3 mg/dL (ref 0.2–1.2)
Total Protein: 6 g/dL (ref 6.0–8.3)

## 2013-09-30 LAB — HEMOGLOBIN A1C: Hgb A1c MFr Bld: 9.2 % — ABNORMAL HIGH (ref 4.6–6.5)

## 2013-09-30 MED ORDER — FUROSEMIDE 20 MG PO TABS
20.0000 mg | ORAL_TABLET | Freq: Every day | ORAL | Status: DC
Start: 2013-09-30 — End: 2013-12-02

## 2013-09-30 NOTE — Telephone Encounter (Signed)
Pt notified of labs and Dr Francesca Oman recommendation for the pt to d/c spironolactone and decrease lasix to 20 mg po daily, and have a BMP done at his PCP appt on this Friday at Dr Elease Hashimoto office. Pt states he will be compliant with his medication recommendations, but pt is refusing to have his labs drawn again stating "this cost me $100 every time you people draw my blood." Informed the pt that he has the right to refuse this and we will respect his right. Informed pt that I will notify Dr Meda Coffee of his choice to not proceed with having lab work on Friday.  Pt verbalized understanding and agrees with this plan.

## 2013-09-30 NOTE — Telephone Encounter (Signed)
Message copied by Nuala Alpha on Mon Sep 30, 2013  5:45 PM ------      Message from: Dorothy Spark      Created: Mon Sep 30, 2013  4:54 PM       Please ask him to discontinue Spironolactone and decrease Lasix to 20 mg po daily. He should have his BMP checked on Friday again (he has his follow up with his PCP at that time).      Thank you,      KN ------

## 2013-10-01 NOTE — Telephone Encounter (Signed)
Ivy, It sounds reasonable, however, we need to follow. Would you mind to call him in 2 weeks to ask how is he doing? Specifically, weight change, SOB, LE edema. Thank you, KN

## 2013-10-02 ENCOUNTER — Encounter: Payer: BC Managed Care – PPO | Admitting: Internal Medicine

## 2013-10-03 ENCOUNTER — Other Ambulatory Visit: Payer: BC Managed Care – PPO

## 2013-10-04 ENCOUNTER — Ambulatory Visit (INDEPENDENT_AMBULATORY_CARE_PROVIDER_SITE_OTHER): Payer: BC Managed Care – PPO | Admitting: Family Medicine

## 2013-10-04 ENCOUNTER — Encounter: Payer: Self-pay | Admitting: Family Medicine

## 2013-10-04 VITALS — BP 136/74 | HR 82 | Wt 220.0 lb

## 2013-10-04 DIAGNOSIS — I10 Essential (primary) hypertension: Secondary | ICD-10-CM

## 2013-10-04 DIAGNOSIS — M8949 Other hypertrophic osteoarthropathy, multiple sites: Secondary | ICD-10-CM

## 2013-10-04 DIAGNOSIS — E1129 Type 2 diabetes mellitus with other diabetic kidney complication: Secondary | ICD-10-CM

## 2013-10-04 DIAGNOSIS — E1165 Type 2 diabetes mellitus with hyperglycemia: Principal | ICD-10-CM

## 2013-10-04 DIAGNOSIS — M159 Polyosteoarthritis, unspecified: Secondary | ICD-10-CM

## 2013-10-04 DIAGNOSIS — M150 Primary generalized (osteo)arthritis: Secondary | ICD-10-CM

## 2013-10-04 DIAGNOSIS — E875 Hyperkalemia: Secondary | ICD-10-CM

## 2013-10-04 MED ORDER — METHOCARBAMOL 500 MG PO TABS: 500.0000 mg | ORAL_TABLET | Freq: Three times a day (TID) | ORAL | Status: DC | PRN

## 2013-10-04 NOTE — Progress Notes (Signed)
Pre visit review using our clinic review tool, if applicable. No additional management support is needed unless otherwise documented below in the visit note. 

## 2013-10-04 NOTE — Patient Instructions (Addendum)
Tennis Elbow Your caregiver has diagnosed you with a condition often referred to as "tennis elbow." This results from small tears or soreness (inflammation) at the start (origin) of the extensor muscles of the forearm. Although the condition is often called tennis or golfer's elbow, it is caused by any repetitive action performed by your elbow. HOME CARE INSTRUCTIONS  If the condition has been short lived, rest may be the only treatment required. Using your opposite hand or arm to perform the task may help. Even changing your grip may help rest the extremity. These may even prevent the condition from recurring.  Longer standing problems, however, will often be relieved faster by:  Using anti-inflammatory agents.  Applying ice packs for 30 minutes at the end of the working day, at bed time, or when activities are finished.  Your caregiver may also have you wear a splint or sling. This will allow the inflamed tendon to heal. At times, steroid injections aided with a local anesthetic will be required along with splinting for 1 to 2 weeks. Two to three steroid injections will often solve the problem. In some long standing cases, the inflamed tendon does not respond to conservative (non-surgical) therapy. Then surgery may be required to repair it. MAKE SURE YOU:   Understand these instructions.  Will watch your condition.  Will get help right away if you are not doing well or get worse. Document Released: 12/27/2004 Document Revised: 03/21/2011 Document Reviewed: 08/15/2007 [facility] Patient Information 2015 [place], [place]. This information is not intended to replace advice given to you by your health care provider. Make sure you discuss any questions you have with your health care provider.  Improve dietary compliance.  Check some 2 hour after meal sugars and be in touch if consistently > 180

## 2013-10-04 NOTE — Progress Notes (Signed)
Subjective:    Patient ID: Anthony Bullock, male    DOB: 1960-06-19, 53 y.o.   MRN: [id number]  HPI Medical followup. He has multiple chronic problems including history of poorly controlled type 2 diabetes with complications, CAD, systolic heart failure, hypertension, chronic kidney disease. History of poor compliance with diet. He continues to drink sugar-containing beverages. He is taking his insulin regularly which includes Lantus 50 units once daily and Humalog 14 units with meals. He also takes Onglyza.  Fasting blood sugars generally ranging between 130 to 150. Recent A1c 9.2%.  Patient saw cardiologist recently had mild hyperkalemia. Spirinolactone discontinued and started on daily furosemide 20 mg. His potassium was 5.3. He has refused further labs today as per recommendation of cardiology.  He complains of multiple musculoskeletal complaints. He has right lateral elbow pain and bilateral knee pains. He has history of prior right total knee replacement. He takes tramadol. He knows to avoid nonsteroidals.  Past Medical History  Diagnosis Date  . Allergic rhinitis   . Diabetes mellitus type II   . Hyperlipidemia   . Hypertension   . Urethral stricture   . Kidney stones   . Myocardial infarction     "stress test showed light heart attack" on 01/16/13  . Chronic systolic heart failure     a. ECHO (12/2012) EF 25-30%, HK entireanteroseptal myocardium b. Cath 01/31/13 RA 6, RV 48/5/7, PA 47/22 (33), PCW 20, Fick CO/CI 5.2/2.5, PVR 2.5  . CHF (congestive heart failure)   . Non-ischemic cardiomyopathy   . Renal insufficiency    Past Surgical History  Procedure Laterality Date  . Reapea urethral surgery for recurrent obstruction  2011  . Total knee arthroplasty  2007  . Left a nd right heart cath  01/30/2013    DR BENSIHMON  . Cardiac defibrillator placement  06/27/2013    Sub Q       BY DR Caryl Comes    reports that he quit smoking about 3 years ago. His smoking use included Cigarettes. He  has a 64 pack-year smoking history. He has never used smokeless tobacco. He reports that he does not drink alcohol or use illicit drugs. family history includes Alcohol abuse in his other; Anemia in his sister; Diabetes in his other; Heart disease in his other; Stroke in his other. Allergies  Allergen Reactions  . Morphine Sulfate Rash    REACTION: Itches all over, red spots      Review of Systems  Constitutional: Negative for fatigue.  Eyes: Negative for visual disturbance.  Respiratory: Negative for cough, chest tightness and shortness of breath.   Cardiovascular: Negative for chest pain, palpitations and leg swelling.  Endocrine: Negative for polydipsia and polyuria.  Genitourinary: Negative for dysuria.  Musculoskeletal: Positive for arthralgias.  Neurological: Negative for dizziness, syncope, weakness, light-headedness and headaches.       Objective:   Physical Exam  Constitutional: He appears well-developed and well-nourished.  Neck: Neck supple. No thyromegaly present.  Cardiovascular: Normal rate and regular rhythm.   Pulmonary/Chest: Effort normal and breath sounds normal. No respiratory distress. He has no wheezes. He has no rales.  Musculoskeletal: He exhibits no edema.  Right elbow reveals full range of motion. He has tenderness over the right lateral epicondylar region and pain with wrist extension against resistance.          Assessment & Plan:  #1 type 2 diabetes. History of poor compliance and poor control. We've recommended checking two-hour postprandial blood sugars and if consistently >  180 we'll titrate his Humalog with meals. He has been reluctant to further titrate Lantus. We have strongly advocating stepping up dietary compliance #2 recent mild hyperkalemia. Change in medications as above. Patient refusing repeat basic metabolic panel today #3 multiple musculoskeletal complaints. Has right lateral epicondylitis and history of known osteoarthritis in  multiple joints. Difficulty sleeping at night in spite of tramadol. Short-term only use Robaxin 500 mg each bedtime #4 health maintenance. Patient is to get flu vaccine next week through work

## 2013-10-06 ENCOUNTER — Other Ambulatory Visit: Payer: Self-pay | Admitting: Cardiology

## 2013-10-06 DIAGNOSIS — M159 Polyosteoarthritis, unspecified: Secondary | ICD-10-CM | POA: Insufficient documentation

## 2013-10-07 ENCOUNTER — Ambulatory Visit: Payer: BC Managed Care – PPO | Admitting: Family Medicine

## 2013-10-11 ENCOUNTER — Telehealth: Payer: Self-pay | Admitting: Family Medicine

## 2013-10-11 MED ORDER — INSULIN GLARGINE 100 UNIT/ML ~~LOC~~ SOLN: 50.0000 [IU] | Freq: Every day | SUBCUTANEOUS | Status: DC

## 2013-10-11 NOTE — Telephone Encounter (Signed)
Rx sent to mail order

## 2013-10-11 NOTE — Telephone Encounter (Signed)
[facility] is requesting re-fill on insulin glargine (LANTUS) 100 UNIT/ML injection

## 2013-10-18 NOTE — Telephone Encounter (Signed)
Contacted the pt per Dr Meda Coffee to ask him how he is doing as far as with weight, sob, and edema.  Per the pt he states he is doing great with no complaints of weight gain, sob, and any LEE.  Pt very appreciative for the check-up.  Will notify Dr Meda Coffee of pts current health status as requested.

## 2013-11-06 ENCOUNTER — Other Ambulatory Visit: Payer: Self-pay | Admitting: Cardiology

## 2013-11-08 ENCOUNTER — Encounter: Payer: Self-pay | Admitting: Internal Medicine

## 2013-11-08 ENCOUNTER — Ambulatory Visit (INDEPENDENT_AMBULATORY_CARE_PROVIDER_SITE_OTHER): Payer: BC Managed Care – PPO | Admitting: Internal Medicine

## 2013-11-08 VITALS — BP 146/70 | HR 80 | Ht 70.0 in | Wt 230.4 lb

## 2013-11-08 DIAGNOSIS — I5021 Acute systolic (congestive) heart failure: Secondary | ICD-10-CM

## 2013-11-08 DIAGNOSIS — Z4502 Encounter for adjustment and management of automatic implantable cardiac defibrillator: Secondary | ICD-10-CM

## 2013-11-08 DIAGNOSIS — I428 Other cardiomyopathies: Secondary | ICD-10-CM

## 2013-11-08 DIAGNOSIS — I429 Cardiomyopathy, unspecified: Secondary | ICD-10-CM | POA: Diagnosis not present

## 2013-11-08 LAB — MDC_IDC_ENUM_SESS_TYPE_INCLINIC
Implantable Pulse Generator Model: 1010
Implantable Pulse Generator Serial Number: 18866
Zone Setting Detection Interval: 240 ms
Zone Setting Detection Interval: 272.73 ms

## 2013-11-08 NOTE — Patient Instructions (Addendum)
Your physician recommends that you schedule a follow-up appointment in: 6 months with Dr. Caryl Comes Your physician recommends that you continue on your current medications as directed. Please refer to the Current Medication list given to you today.

## 2013-11-08 NOTE — Progress Notes (Signed)
Patient Care Team: [name], MD as PCP - General   HPI  Anthony Bullock is a 53 y.o. male Seen in follow-up for ICD implantation-subcutaneous. (6/15) He has a history of nonischemic cardiomyopathy renal insufficiency.  The patient denies chest pain, shortness of breath, nocturnal dyspnea, orthopnea or peripheral edema.  There have been no palpitations, lightheadedness or syncope.   No device discharges  Past Medical History  Diagnosis Date  . Allergic rhinitis   . Diabetes mellitus type II   . Hyperlipidemia   . Hypertension   . Urethral stricture   . Kidney stones   . Myocardial infarction     "stress test showed light heart attack" on 01/16/13  . Chronic systolic heart failure     a. ECHO (12/2012) EF 25-30%, HK entireanteroseptal myocardium b. Cath 01/31/13 RA 6, RV 48/5/7, PA 47/22 (33), PCW 20, Fick CO/CI 5.2/2.5, PVR 2.5  . CHF (congestive heart failure)   . Non-ischemic cardiomyopathy   . Renal insufficiency     Past Surgical History  Procedure Laterality Date  . Reapea urethral surgery for recurrent obstruction  2011  . Total knee arthroplasty  2007  . Left a nd right heart cath  01/30/2013    DR BENSIHMON  . Cardiac defibrillator placement  06/27/2013    Sub Q       BY DR Caryl Comes    Current Outpatient Prescriptions  Medication Sig Dispense Refill  . amLODipine (NORVASC) 2.5 MG tablet TAKE 1 TABLET DAILY  30 tablet  1  . aspirin EC 81 MG tablet Take 162 mg by mouth daily.      [name] atorvastatin (LIPITOR) 80 MG tablet TAKE 1 TABLET AT BEDTIME  90 tablet  0  . azelastine (ASTELIN) 137 MCG/SPRAY nasal spray Place 2 sprays into both nostrils 3 (three) times daily as needed for rhinitis. Use in each nostril as directed  30 mL  12  . carvedilol (COREG) 25 MG tablet Take 1 tablet (25 mg total) by mouth 2 (two) times daily with a meal.  180 tablet  0  . fenofibrate 160 MG tablet Take 1 tablet (160 mg total) by mouth daily.  90 tablet  3  . fexofenadine  (ALLEGRA) 180 MG tablet Take 180 mg by mouth daily.      . furosemide (LASIX) 20 MG tablet Take 1 tablet (20 mg total) by mouth daily.  90 tablet  6  . gabapentin (NEURONTIN) 300 MG capsule Take 1 capsule (300 mg total) by mouth 3 (three) times daily.  270 capsule  3  . insulin glargine (LANTUS) 100 UNIT/ML injection Inject 0.5 mLs (50 Units total) into the skin daily.  10 mL  5  . insulin lispro (HUMALOG) 100 UNIT/ML injection Inject 0-14 Units into the skin 3 (three) times daily as needed for high blood sugar (per sliding scale).      . methocarbamol (ROBAXIN) 500 MG tablet Take 1 tablet (500 mg total) by mouth every 8 (eight) hours as needed for muscle spasms.  60 tablet  1  . Olopatadine HCl (PATADAY) 0.2 % SOLN Place 1 drop into both eyes daily as needed (for allergies).       . saxagliptin HCl (ONGLYZA) 5 MG TABS tablet Take 1 tablet (5 mg total) by mouth daily.  90 tablet  3   No current facility-administered medications for this visit.    Allergies  Allergen Reactions  . Morphine Sulfate Rash    REACTION: Itches  all over, red spots    Review of Systems negative except from HPI and PMH  Physical Exam BP 146/70  Pulse 80  Ht 5\' 10"  (1.778 m)  Wt 230 lb 6.4 oz (104.509 kg)  BMI 33.06 kg/m2 Well developed and well nourished in no acute distress HENT normal E scleral and icterus clear Neck Supple JVP flat; carotids brisk and full Clear to ausculation Device pocket well healed; without hematoma or erythema.  There is no tethering there was a small pustular-like lesion at the cephalad aspect of the lateral incision. I explored it and found some whitish firm material I don't whether it is related to the suture or not it was removed regular rate and rhythm, no murmurs gallops or rub Soft with active bowel sounds No clubbing cyanosis  Edema Alert and oriented, grossly normal motor and sensory function Skin Warm and Dry    Assessment and  Plan  ICD-subcutaneous  Nonischemic  cardiomyopathy  Device function is normal. The pocket incision was explored as noted above. We have given a Neosporin bandage to keep this moist. He is to let us know if it does not completely resolve. It is very superficial  Functional status is stable

## 2013-11-23 ENCOUNTER — Other Ambulatory Visit: Payer: Self-pay | Admitting: Cardiology

## 2013-11-29 ENCOUNTER — Ambulatory Visit: Payer: BC Managed Care – PPO | Admitting: Cardiology

## 2013-12-02 ENCOUNTER — Other Ambulatory Visit: Payer: Self-pay

## 2013-12-02 MED ORDER — FUROSEMIDE 40 MG PO TABS: ORAL_TABLET | ORAL | Status: DC

## 2013-12-19 ENCOUNTER — Encounter (HOSPITAL_COMMUNITY): Payer: Self-pay | Admitting: Internal Medicine

## 2013-12-23 ENCOUNTER — Telehealth: Payer: Self-pay | Admitting: Cardiology

## 2013-12-23 DIAGNOSIS — R0602 Shortness of breath: Secondary | ICD-10-CM

## 2013-12-23 DIAGNOSIS — N289 Disorder of kidney and ureter, unspecified: Secondary | ICD-10-CM

## 2013-12-23 NOTE — Telephone Encounter (Signed)
I would check CMP and BNP. Also tell him to increase lasix to 40 mg po daily. Thank you, KN

## 2013-12-23 NOTE — Telephone Encounter (Signed)
New Message   Pt called states that he was supposed to have labs completed before appt. Please put in new orders if needed//sr

## 2013-12-23 NOTE — Telephone Encounter (Signed)
Pt is aware to came for Blood work on Wednesday December 16 th in Am prior office visit with Dr. Meda Coffee. Order was placed for CMET,BNP.

## 2013-12-23 NOTE — Telephone Encounter (Signed)
Pt called because he states Dr. Meda Coffee wanted for him to have BMET drown when he was going to see his PCP the coming Friday this past September,  because MD had D/C his Spironolactone and decrease pt's lasix to 20 mg daily. Pt had refused to have any blood work done then, because of the cost. Pt now has an appointment with Dr. Meda Coffee on 12/27/13. He wants to have blood drawn this coming Wednesday 12/25/13. Pt has continue taking the Lasix 40 mg instead of the 20 mg,  because he said that the 20 mg did not helping; he continued having edema in LE. Beside BMET, what else we need to have drown for pt.[name]

## 2013-12-25 ENCOUNTER — Other Ambulatory Visit (INDEPENDENT_AMBULATORY_CARE_PROVIDER_SITE_OTHER): Payer: BC Managed Care – PPO | Admitting: *Deleted

## 2013-12-25 DIAGNOSIS — N289 Disorder of kidney and ureter, unspecified: Secondary | ICD-10-CM

## 2013-12-25 DIAGNOSIS — R0602 Shortness of breath: Secondary | ICD-10-CM

## 2013-12-25 LAB — COMPREHENSIVE METABOLIC PANEL
ALT: 19 U/L (ref 0–53)
AST: 24 U/L (ref 0–37)
Albumin: 2 g/dL — ABNORMAL LOW (ref 3.5–5.2)
Alkaline Phosphatase: 74 U/L (ref 39–117)
BUN: 35 mg/dL — ABNORMAL HIGH (ref 6–23)
CO2: 24 mEq/L (ref 19–32)
Calcium: 7.2 mg/dL — ABNORMAL LOW (ref 8.4–10.5)
Chloride: 111 mEq/L (ref 96–112)
Creatinine, Ser: 3 mg/dL — ABNORMAL HIGH (ref 0.4–1.5)
GFR: 23.45 mL/min — ABNORMAL LOW (ref >60.00)
Glucose, Bld: 140 mg/dL — ABNORMAL HIGH (ref 70–99)
Potassium: 4.2 mEq/L (ref 3.5–5.1)
Sodium: 137 mEq/L (ref 135–145)
Total Bilirubin: 0.3 mg/dL (ref 0.2–1.2)
Total Protein: 4.7 g/dL — ABNORMAL LOW (ref 6.0–8.3)

## 2013-12-25 LAB — BRAIN NATRIURETIC PEPTIDE: Pro B Natriuretic peptide (BNP): 258 pg/mL — ABNORMAL HIGH (ref 0.0–100.0)

## 2013-12-27 ENCOUNTER — Encounter: Payer: Self-pay | Admitting: Cardiology

## 2013-12-27 ENCOUNTER — Ambulatory Visit (INDEPENDENT_AMBULATORY_CARE_PROVIDER_SITE_OTHER): Payer: BC Managed Care – PPO | Admitting: Cardiology

## 2013-12-27 VITALS — BP 200/90 | HR 90 | Ht 70.0 in | Wt 230.0 lb

## 2013-12-27 DIAGNOSIS — N183 Chronic kidney disease, stage 3 unspecified: Secondary | ICD-10-CM

## 2013-12-27 DIAGNOSIS — I429 Cardiomyopathy, unspecified: Secondary | ICD-10-CM

## 2013-12-27 DIAGNOSIS — I5023 Acute on chronic systolic (congestive) heart failure: Secondary | ICD-10-CM

## 2013-12-27 DIAGNOSIS — I10 Essential (primary) hypertension: Secondary | ICD-10-CM

## 2013-12-27 DIAGNOSIS — I420 Dilated cardiomyopathy: Secondary | ICD-10-CM

## 2013-12-27 MED ORDER — AMLODIPINE BESYLATE 2.5 MG PO TABS: 2.5000 mg | ORAL_TABLET | Freq: Every day | ORAL | Status: DC

## 2013-12-27 MED ORDER — CARVEDILOL 25 MG PO TABS: 25.0000 mg | ORAL_TABLET | Freq: Two times a day (BID) | ORAL | Status: DC

## 2013-12-27 MED ORDER — SPIRONOLACTONE 25 MG PO TABS: 25.0000 mg | ORAL_TABLET | Freq: Every day | ORAL | Status: DC

## 2013-12-27 NOTE — Progress Notes (Signed)
Patient ID: Anthony Bullock, male   DOB: 02/09/1960, 53 y.o.   MRN: [id number]    PCP: Dr. Elease Hashimoto  53 yo with history of diabetes, diabetic nephropathy, CAD, and primarily nonischemic cardiomyopathy presents for cardiology followup. Had had LHC in 1/15 with some CAD but no interventional target (looks like diabetic vascular disease).  Initial echo in 12/14 with EF 25-30%.  I reviewed his echo today: EF remains low at 25%. Weight is down 10 lbs since last appointment.  After cath, creatinine went up to 2.4, most recently back to 2.0.  Ramipril was decreased to 5 mg daily.  He denies exertional dyspnea or chest pain.  He can climb a flight of steps without problems.  No orthopnea (improved), no PND, no LE edema.  He continues to work with limited weight lifting to 30 pounds and has no problem doing that. He was seen by Dr. Rayann Heman and scheduled for an ICD implantation in April. This was cancelled because of Crea elevation 1.8 --> 2.4--> 3.0. His Lasix has been held for the last 3 weeks and now feels SOB after walking 1 flight of stairs. His weight increased from 196 to 201 lbs in 1 week. He is scheduled for CTA of the abdomen/pelvis this Wednesday followed by Nephrology and urology appointment.   09/02/2013 - the patient is doing great, no LE edema with Lasix 40 mg po daily, last Crea 2.6 on 8/17, (baseline 3.0), he has received a subcutaneous ICD on 07/08/13 by Dr Caryl Comes. He continues to work. No CP. NYHA II. patient's only complaint today but he feels tired when he wakes up and all day.  12/27/2013 - patient is coming after 4 months. He states that about a week ago he ran out of all the medicines and he hasn't been taking any heart failure blood pressure medicine for about that time. He also states that he has been celebrating on and eating foods high in sodium including ham and his lower extremity edema has worsened over the last week. He otherwise denies any chest pain. He's ICD was recently checked and is  functioning properly. He denies any palpitations or syncope, no ICD firing, he denies any orthopnea or paroxysmal nocturnal dyspnea. His most recent creatinine was 3.0 with a baseline creatinine being 2.9. He continues to complain of he is bilateral knee pain and lower extremity pain after walking all day he states that tramadol. We prescribed him didn't improve his symptoms. He is able to function and work full time without any significant limitations.  ECG: NSR, normal (QRS not widened)  PMH: 1. Cardiomyopathy: Primarily nonischemic.  Echo (12/14) with EF 25-30%.  Echo (3/15) with EF 25%, mild to moderately dilated LV, normal RV size and systolic function.  RHC/LHC (1/15) with mean RA 6, PA 47/22 mean 33, mean PCWP 20, PVR 2.5 WU, CI 2.5; 80% dLAD stenosis, 70% diffuse large D.  2. CAD: LHC (1/15) with 80% dLAD, 70% diffuse large diagonal.   3. 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  4. CKD stage III: Likely related to diabetes.  5. Hyperlipidemia.   FH: No premature CAD, no SCD.   SH: Married, lives in [place]. Works at [employer].  No longer smokes, quit 2012 smoked for 36 years.  ROS: All systems reviewed and negative except as per HPI.   Current Outpatient Prescriptions  Medication Sig Dispense Refill  . amLODipine (NORVASC) 2.5 MG tablet TAKE 1 TABLET DAILY 30 tablet 1  . aspirin EC 81  MG tablet Take 162 mg by mouth daily.    [name] atorvastatin (LIPITOR) 80 MG tablet TAKE 1 TABLET AT BEDTIME 90 tablet 0  . azelastine (ASTELIN) 137 MCG/SPRAY nasal spray Place 2 sprays into both nostrils 3 (three) times daily as needed for rhinitis. Use in each nostril as directed 30 mL 12  . carvedilol (COREG) 25 MG tablet Take 1 tablet (25 mg total) by mouth 2 (two) times daily with a meal. 180 tablet 0  . fenofibrate 160 MG tablet Take 1 tablet (160 mg total) by mouth daily. 90 tablet 3  . fexofenadine (ALLEGRA) 180 MG tablet Take 180 mg by mouth daily.    . furosemide (LASIX)  40 MG tablet Take one tablet by mouth every other day 45 tablet 2  . gabapentin (NEURONTIN) 300 MG capsule Take 1 capsule (300 mg total) by mouth 3 (three) times daily. 270 capsule 3  . insulin glargine (LANTUS) 100 UNIT/ML injection Inject 0.5 mLs (50 Units total) into the skin daily. 10 mL 5  . insulin lispro (HUMALOG) 100 UNIT/ML injection Inject 0-14 Units into the skin 3 (three) times daily as needed for high blood sugar (per sliding scale).    . Olopatadine HCl (PATADAY) 0.2 % SOLN Place 1 drop into both eyes daily as needed (for allergies).     . saxagliptin HCl (ONGLYZA) 5 MG TABS tablet Take 1 tablet (5 mg total) by mouth daily. 90 tablet 3   No current facility-administered medications for this visit.   BP 200/90 mmHg  Pulse 90  Ht 5\' 10"  (1.778 m)  Wt 230 lb (104.327 kg)  BMI 33.00 kg/m2 General: NAD Neck: No JVD, no thyromegaly or thyroid nodule.  Lungs: Clear to auscultation bilaterally with normal respiratory effort. CV: Nondisplaced PMI.  Heart regular S1/S2, no S3/S4, no murmur.  No peripheral edema.  No carotid bruit.  Normal pedal pulses.  Abdomen: Soft, nontender, no hepatosplenomegaly, no distention.  Skin: Intact without lesions or rashes.  Neurologic: Alert and oriented x 3.  Psych: Normal affect. Extremities: No clubbing or cyanosis. B/L LE edema + 2, R> L, upto the knees  ECG: SR, new T wave inversions in the lateral leads    Assessment/Plan:  1. Acute on chronic systolic CHF: Primarily nonischemic cardiomyopathy.  EF remains 25%.  NYHA class II-III symptoms at this point.    We will continue Lasix 40 mg po daily and add Spironolactone 25 mg po daily.  We will await recommendations from nephrology and urology and follow. -  S/P sq ICD implantation, he is not a candidate for CRT given narrow QRS.  -  Restart Coreg to 25 mg bid and amlodipine 2.5 mg po daily. -  continue lasix 40 mg po daily, add spironolactone 25 mg po daily.  -  stopped ACEI given recent  rise in creatinine.   2. Hypertension - severely elevated - we will restart his BP meds, add spironolactone and follow BMP.   3. CAD: Moderate CAD on cath, unlikely that this is the cause of cardiomyopathy.  Continue atorvastatin 80 mg and ASA 81 mg daily.   4. Hyperlipidemia: Goal LDL < 70.   5. CKD: Stage III:  Creatinine rose after cath. Its most probably a combination of chronic low cardiac output, diuretic therapy and contrast nephropathy post cath. I don't expect significant improvement with Crea better than 1.8. We will d/c ramipril and start amlodipine 2.5 mg po daily.   6. Weak peripheral pulses - claudications - we will  order B/L LE arterial Duplex  Followup in 6 weeks with BMP prior to the next visit.   Dorothy Spark 12/27/2013 11:25 AM

## 2013-12-27 NOTE — Patient Instructions (Signed)
Your physician recommends that you schedule a follow-up appointment in:   [place] has recommended you make the following change in your medication:  START   SPIRONOLACTONE   25 MG EVERY DAY

## 2014-01-07 ENCOUNTER — Telehealth: Payer: Self-pay | Admitting: Cardiology

## 2014-01-07 ENCOUNTER — Encounter: Payer: Self-pay | Admitting: Family Medicine

## 2014-01-07 ENCOUNTER — Ambulatory Visit (INDEPENDENT_AMBULATORY_CARE_PROVIDER_SITE_OTHER): Payer: BC Managed Care – PPO | Admitting: Family Medicine

## 2014-01-07 VITALS — BP 140/72 | HR 82 | Temp 98.0°F | Wt 238.0 lb

## 2014-01-07 DIAGNOSIS — R600 Localized edema: Secondary | ICD-10-CM

## 2014-01-07 DIAGNOSIS — E1165 Type 2 diabetes mellitus with hyperglycemia: Secondary | ICD-10-CM

## 2014-01-07 DIAGNOSIS — M170 Bilateral primary osteoarthritis of knee: Secondary | ICD-10-CM

## 2014-01-07 DIAGNOSIS — N183 Chronic kidney disease, stage 3 unspecified: Secondary | ICD-10-CM

## 2014-01-07 DIAGNOSIS — E1129 Type 2 diabetes mellitus with other diabetic kidney complication: Secondary | ICD-10-CM

## 2014-01-07 DIAGNOSIS — I5022 Chronic systolic (congestive) heart failure: Secondary | ICD-10-CM

## 2014-01-07 LAB — BASIC METABOLIC PANEL
BUN: 29 mg/dL — ABNORMAL HIGH (ref 6–23)
CO2: 25 mEq/L (ref 19–32)
Calcium: 7.7 mg/dL — ABNORMAL LOW (ref 8.4–10.5)
Chloride: 109 mEq/L (ref 96–112)
Creatinine, Ser: 3.6 mg/dL — ABNORMAL HIGH (ref 0.4–1.5)
GFR: 18.76 mL/min — ABNORMAL LOW (ref >60.00)
Glucose, Bld: 336 mg/dL — ABNORMAL HIGH (ref 70–99)
Potassium: 4.3 mEq/L (ref 3.5–5.1)
Sodium: 139 mEq/L (ref 135–145)

## 2014-01-07 LAB — HEMOGLOBIN A1C: Hgb A1c MFr Bld: 12.1 % — ABNORMAL HIGH (ref 4.6–6.5)

## 2014-01-07 MED ORDER — HYDROCODONE-ACETAMINOPHEN 5-325 MG PO TABS: 1.0000 | ORAL_TABLET | Freq: Four times a day (QID) | ORAL | Status: DC | PRN

## 2014-01-07 NOTE — Patient Instructions (Signed)
Elevate legs frequently Watch your salt. Avoid Aleve or other nonsteroidal medications. Increase Lasix 40 mg to one TWICE daily until follow up We will call you with nephrology appt.

## 2014-01-07 NOTE — Progress Notes (Signed)
Pre visit review using our clinic review tool, if applicable. No additional management support is needed unless otherwise documented below in the visit note. 

## 2014-01-07 NOTE — Progress Notes (Signed)
Subjective:    Patient ID: Anthony Bullock, male    DOB: [birth year], 53 y.o.   MRN: [id number]  HPI  patient care with chief complaint of 1 day history of "rash" lower legs. Non-pruritic and nonpainful.  He also relates about 2 week history of some progressive leg edema. He was just seen by cardiologist couple weeks ago on the 18th with weight of 230 pounds and is up to 238 pounds today. He has history of chronic systolic heart failure. Recent pro BNP 258. He denies any orthopnea or dyspnea on exertion. No chest pains. Has had poor dietary compliance over the holidays.   Recent addition of spirinolactone 25 mg daily and amlodipine 2.5 mg daily for poorly controlled hypertension. He takes Lasix 40 mg daily and is also on carvedilol 25 mg twice daily. Recent discontinuation of ACE inhibitor. He's had chronic kidney disease with recent creatinine 3.0. He has not seen nephrologist in quite some time.   Other recent lab significant for albumin 2.0. He has history of severe osteoarthritis in knees and apparently has been taking frequent Aleve recently. He states his pain is not controlled with Tylenol.  Past Medical History  Diagnosis Date  . Allergic rhinitis   . Diabetes mellitus type II   . Hyperlipidemia   . Hypertension   . Urethral stricture   . Kidney stones   . Myocardial infarction     "stress test showed light heart attack" on 01/16/13  . Chronic systolic heart failure     a. ECHO (12/2012) EF 25-30%, HK entireanteroseptal myocardium b. Cath 01/31/13 RA 6, RV 48/5/7, PA 47/22 (33), PCW 20, Fick CO/CI 5.2/2.5, PVR 2.5  . CHF (congestive heart failure)   . Non-ischemic cardiomyopathy   . Renal insufficiency    Past Surgical History  Procedure Laterality Date  . Reapea urethral surgery for recurrent obstruction  2011  . Total knee arthroplasty  2007  . Left a nd right heart cath  01/30/2013    DR BENSIHMON  . Cardiac defibrillator placement  06/27/2013    Sub Q       BY DR Caryl Comes  .  Left and right heart catheterization with coronary angiogram N/A 01/30/2013    Procedure: LEFT AND RIGHT HEART CATHETERIZATION WITH CORONARY ANGIOGRAM;  Surgeon: Jolaine Artist, MD;  Location: [facility] CATH LAB;  Service: Cardiovascular;  Laterality: N/A;  . Implantable cardioverter defibrillator implant N/A 06/27/2013    Procedure: SUB Q ICD;  Surgeon: Deboraha Sprang, MD;  Location: [facility] CATH LAB;  Service: Cardiovascular;  Laterality: N/A;    reports that he quit smoking about 3 years ago. His smoking use included Cigarettes. He has a 64 pack-year smoking history. He has never used smokeless tobacco. He reports that he does not drink alcohol or use illicit drugs. family history includes Alcohol abuse in his other; Anemia in his sister; Diabetes in his other; Heart disease in his other; Stroke in his other. Allergies  Allergen Reactions  . Morphine Sulfate Rash    REACTION: Itches all over, red spots      Review of Systems  Constitutional: Negative for fever, chills, appetite change and unexpected weight change.  Respiratory: Negative for cough and shortness of breath.   Cardiovascular: Positive for leg swelling. Negative for chest pain and palpitations.  Gastrointestinal: Negative for abdominal pain.  Genitourinary: Negative for decreased urine volume.  Musculoskeletal: Positive for arthralgias.  Neurological: Negative for dizziness.       Objective:   Physical  Exam  Constitutional: He appears well-developed and well-nourished.  Neck: Neck supple. No JVD present.  Cardiovascular: Normal rate and regular rhythm.   Pulmonary/Chest: Effort normal and breath sounds normal. No respiratory distress. He has no wheezes. He has no rales.  Musculoskeletal: He exhibits edema.  Neurological: He is alert.  Skin:  Patient has mostly nonblanching petechial type rash lower extremities only. Nontender to palpation. No open ulcerations.  Psychiatric: He has a normal mood and affect.            Assessment & Plan:  #1 fairly severe bilateral leg edema worsening over the past couple weeks. Suspect decompensated systolic heart failure is NOT the major issue here. He's not had any increased dyspnea with exertion nor any orthopnea-and exam does not suggest overt heart failure. Likely multifactorial-chronic kidney disease, hypoalbuminemia, recent skin nonsteroidal use with Aleve, amlodipine (although low dose), ?OSA and chronic gabapentin use. Avoid nonsteroidals. Elevate legs frequently. Repeat basic metabolic panel. Repeat pro BNP level. Watch sodium intake closely.  Increased furosemide 40 mg twice a day and may need to go higher with chronic kidney disease. Set up nephrology referral #2 hypertension which is improved today with recent medication changes as above. Check basic metabolic panel with recent addition of Aldactone #3 type 2 diabetes with history of poor control and history of poor compliance. Repeat A1c #4 osteoarthritis knees.  Avoid NSAIDS as above.  Limited Vicodin one every 6 hours prn severe pain.

## 2014-01-07 NOTE — Telephone Encounter (Signed)
Pt calling to inform Dr Meda Coffee that he woke up this morning with red spots noted on bilateral lower legs.  Pt states that both legs are warm-to-touch in some areas.  Pt states its more patchy red spots verses red streaks.  Pt denies any fever at this time.  Pt denies cp or sob at this time.  Pt denies any cough at this time.  Pt states its painful to touch.  No other red patches noted on pts upper extremities or trunk area.  Pt denies any airway complications at this time.  No itching noted.  Informed the pt that Dr Meda Coffee is out of the office this week, and advised him to contact his PCP Dr. Elease Hashimoto for further eval. Informed the pt that if Dr Elease Hashimoto would like any type PV study done, to inform our office, for we can do this.  Pt verbalized understanding and agrees with this plan stating, "I will call my PCP now and schedule an appt and follow-up with your office thereafter." Will forward this message to Dr Meda Coffee for her review.

## 2014-01-07 NOTE — Telephone Encounter (Signed)
New message     Pt has edema---out of work for 1 week.  Increased lasix to two  40mg  tab daily.  His PCP is Dr Elease Hashimoto

## 2014-01-07 NOTE — Telephone Encounter (Signed)
New problem   Pt calling stating he has developed red patches on both of his legs, pt doesn't know what's its coming from. Please call pt.

## 2014-01-07 NOTE — Telephone Encounter (Signed)
Pt calling stating that he went to his PCP as advised and per Dr Elease Hashimoto, the pt was diagnosed with LEE and he took the pt out of work for one week and increased his Lasix to 40 mg po BID until next OV with Dr Elease Hashimoto next Wednesday 01/15/14.  Pt said that PCP will be routing his notes and lab results from today to Dr Meda Coffee via epic.  Informed the pt that I will route this message to Dr Meda Coffee for her review.  Pt verbalized understanding, agrees with this plan, and gracious for all the assistance provided.

## 2014-01-08 LAB — PRO B NATRIURETIC PEPTIDE: Pro B Natriuretic peptide (BNP): 7608 pg/mL — ABNORMAL HIGH (ref <126)

## 2014-01-10 NOTE — Telephone Encounter (Signed)
Ivy, could you please call him and ask if there is any change in his condition and if he followed with Dr Elease Hashimoto? Thank you, KN

## 2014-01-13 NOTE — Telephone Encounter (Signed)
Forward to [employer]

## 2014-01-15 ENCOUNTER — Encounter: Payer: Self-pay | Admitting: Family Medicine

## 2014-01-15 ENCOUNTER — Ambulatory Visit (INDEPENDENT_AMBULATORY_CARE_PROVIDER_SITE_OTHER): Payer: BLUE CROSS/BLUE SHIELD | Admitting: Family Medicine

## 2014-01-15 VITALS — BP 142/70 | HR 78 | Temp 97.8°F | Wt 219.0 lb

## 2014-01-15 DIAGNOSIS — E1165 Type 2 diabetes mellitus with hyperglycemia: Secondary | ICD-10-CM

## 2014-01-15 DIAGNOSIS — N183 Chronic kidney disease, stage 3 unspecified: Secondary | ICD-10-CM

## 2014-01-15 DIAGNOSIS — I5022 Chronic systolic (congestive) heart failure: Secondary | ICD-10-CM

## 2014-01-15 DIAGNOSIS — E1129 Type 2 diabetes mellitus with other diabetic kidney complication: Secondary | ICD-10-CM

## 2014-01-15 DIAGNOSIS — N184 Chronic kidney disease, stage 4 (severe): Secondary | ICD-10-CM

## 2014-01-15 LAB — BASIC METABOLIC PANEL
BUN: 31 mg/dL — ABNORMAL HIGH (ref 6–23)
CO2: 28 mEq/L (ref 19–32)
Calcium: 8.3 mg/dL — ABNORMAL LOW (ref 8.4–10.5)
Chloride: 104 mEq/L (ref 96–112)
Creatinine, Ser: 3.8 mg/dL — ABNORMAL HIGH (ref 0.4–1.5)
GFR: 17.85 mL/min — ABNORMAL LOW (ref >60.00)
Glucose, Bld: 327 mg/dL — ABNORMAL HIGH (ref 70–99)
Potassium: 4.8 mEq/L (ref 3.5–5.1)
Sodium: 134 mEq/L — ABNORMAL LOW (ref 135–145)

## 2014-01-15 NOTE — Patient Instructions (Addendum)
Increase Lasix to 80 mg in AM and continue with 40 mg in afternoon. Increase Lantus 2 units every 3 days until fasting blood sugars around 130 or less

## 2014-01-15 NOTE — Progress Notes (Signed)
Pre visit review using our clinic review tool, if applicable. No additional management support is needed unless otherwise documented below in the visit note. 

## 2014-01-15 NOTE — Progress Notes (Signed)
Subjective:    Patient ID: Anthony Bullock, male    DOB: 01-03-1961, 54 y.o.   MRN: [id number]  HPI Seen for follow-up from visit last week. He has history of CAD, systolic heart failure, COPD, type 2 diabetes with poor control. He has history of nonischemic cardiomyopathy. He came in with substantial weight gain in peripheral edema. Pro BNP level 7608. Creatinine slightly elevated 3.6. We increased his Lasix to 40 mg twice a day. His weight is down almost 20 pounds today. Overall, he feels much better. He has never had any orthopnea or extreme dyspnea. No recent chest pains.  Diabetes very poorly controlled. A1c 12. Not taking oral medication with Onglyza-secondary to insurance/cost issues. No recent hypoglycemia. Fasting blood sugars frequently over 200.  He has chronic kidney disease and we recommended getting back into see nephrology and he has appointment next week  Past Medical History  Diagnosis Date  . Allergic rhinitis   . Diabetes mellitus type II   . Hyperlipidemia   . Hypertension   . Urethral stricture   . Kidney stones   . Myocardial infarction     "stress test showed light heart attack" on 01/16/13  . Chronic systolic heart failure     a. ECHO (12/2012) EF 25-30%, HK entireanteroseptal myocardium b. Cath 01/31/13 RA 6, RV 48/5/7, PA 47/22 (33), PCW 20, Fick CO/CI 5.2/2.5, PVR 2.5  . CHF (congestive heart failure)   . Non-ischemic cardiomyopathy   . Renal insufficiency    Past Surgical History  Procedure Laterality Date  . Reapea urethral surgery for recurrent obstruction  2011  . Total knee arthroplasty  2007  . Left a nd right heart cath  01/30/2013    DR BENSIHMON  . Cardiac defibrillator placement  06/27/2013    Sub Q       BY DR Caryl Comes  . Left and right heart catheterization with coronary angiogram N/A 01/30/2013    Procedure: LEFT AND RIGHT HEART CATHETERIZATION WITH CORONARY ANGIOGRAM;  Surgeon: Jolaine Artist, MD;  Location: [facility] CATH LAB;  Service:  Cardiovascular;  Laterality: N/A;  . Implantable cardioverter defibrillator implant N/A 06/27/2013    Procedure: SUB Q ICD;  Surgeon: Deboraha Sprang, MD;  Location: [facility] CATH LAB;  Service: Cardiovascular;  Laterality: N/A;    reports that he quit smoking about 3 years ago. His smoking use included Cigarettes. He has a 64 pack-year smoking history. He has never used smokeless tobacco. He reports that he does not drink alcohol or use illicit drugs. family history includes Alcohol abuse in his other; Anemia in his sister; Diabetes in his other; Heart disease in his other; Stroke in his other. Allergies  Allergen Reactions  . Morphine Sulfate Rash    REACTION: Itches all over, red spots      Review of Systems  Constitutional: Negative for fatigue.  Eyes: Negative for visual disturbance.  Respiratory: Negative for cough, chest tightness and shortness of breath.   Cardiovascular: Positive for leg swelling. Negative for chest pain and palpitations.  Neurological: Negative for dizziness, syncope, weakness, light-headedness and headaches.       Objective:   Physical Exam  Constitutional: He appears well-developed and well-nourished.  Neck: Neck supple. No JVD present.  Cardiovascular: Normal rate and regular rhythm.   Pulmonary/Chest: Effort normal and breath sounds normal. No respiratory distress. He has no wheezes. He has no rales.  Musculoskeletal: He exhibits edema.  Still has 1+ pitting edema left leg and 1-2+ right but overall  greatly improved compared to last week.  Skin:  No skin breakdown.          Assessment & Plan:  Patient has nonischemic cardiomyopathy with recent BNP level over 7000 and increased peripheral edema in setting of chronic kidney disease. He has improved greatly with increase in furosemide (20 pd weight loss and less leg edema). We recommended further increasing furosemide 80 mg in morning and 40 mg in afternoon. Check basic metabolic panel. Continue follow-up  with nephrology  Type 2 diabetes poorly controlled. Titration regimen given for Lantus. He has hx of poor compliance with diet and meds which has made management of diabetes very difficult.

## 2014-01-20 ENCOUNTER — Ambulatory Visit: Payer: BC Managed Care – PPO | Admitting: Family Medicine

## 2014-01-21 ENCOUNTER — Telehealth: Payer: Self-pay | Admitting: Family Medicine

## 2014-01-21 NOTE — Telephone Encounter (Addendum)
Pt needs a note to return to work on Jan 23, 2014.  Would like to pu wed. tomorrow

## 2014-01-22 NOTE — Telephone Encounter (Signed)
Pt is aware that letter is ready for pickup

## 2014-01-22 NOTE — Telephone Encounter (Signed)
OK 

## 2014-01-23 ENCOUNTER — Other Ambulatory Visit: Payer: Self-pay | Admitting: Nephrology

## 2014-01-23 DIAGNOSIS — N184 Chronic kidney disease, stage 4 (severe): Secondary | ICD-10-CM

## 2014-01-28 ENCOUNTER — Ambulatory Visit
Admission: RE | Admit: 2014-01-28 | Discharge: 2014-01-28 | Disposition: A | Discharge status: Home / Self Care | Payer: BLUE CROSS/BLUE SHIELD | Source: Ambulatory Visit | Attending: Nephrology | Admitting: Nephrology

## 2014-01-28 DIAGNOSIS — N184 Chronic kidney disease, stage 4 (severe): Secondary | ICD-10-CM

## 2014-03-07 ENCOUNTER — Ambulatory Visit: Payer: BC Managed Care – PPO | Admitting: Cardiology

## 2014-03-10 ENCOUNTER — Other Ambulatory Visit: Payer: Self-pay

## 2014-03-10 ENCOUNTER — Telehealth: Payer: Self-pay | Admitting: *Deleted

## 2014-03-10 MED ORDER — ATORVASTATIN CALCIUM 80 MG PO TABS: 80.0000 mg | ORAL_TABLET | Freq: Every day | ORAL | Status: DC

## 2014-03-10 MED ORDER — AZELASTINE HCL 0.1 % NA SOLN: 2.0000 | Freq: Three times a day (TID) | NASAL | Status: DC | PRN

## 2014-03-10 MED ORDER — GLUCOSE BLOOD VI STRP: ORAL_STRIP | Status: DC

## 2014-03-10 MED ORDER — BD ULTRA-FINE LANCETS MISC: Status: DC

## 2014-03-10 NOTE — Telephone Encounter (Signed)
Pt called requesting refills to [facility] and a 90 day supply on each one 1-onetouch ultra blue test strips 2-bdultra fine pin needles 3- azelaftine nasal spray. Please advise

## 2014-03-10 NOTE — Telephone Encounter (Signed)
RX sent to mail order 

## 2014-03-17 ENCOUNTER — Encounter: Payer: Self-pay | Admitting: Family Medicine

## 2014-03-17 ENCOUNTER — Ambulatory Visit (INDEPENDENT_AMBULATORY_CARE_PROVIDER_SITE_OTHER): Payer: BLUE CROSS/BLUE SHIELD | Admitting: Family Medicine

## 2014-03-17 VITALS — BP 136/72 | HR 78 | Temp 98.6°F | Wt 220.0 lb

## 2014-03-17 DIAGNOSIS — E1129 Type 2 diabetes mellitus with other diabetic kidney complication: Secondary | ICD-10-CM

## 2014-03-17 DIAGNOSIS — R600 Localized edema: Secondary | ICD-10-CM

## 2014-03-17 DIAGNOSIS — N184 Chronic kidney disease, stage 4 (severe): Secondary | ICD-10-CM

## 2014-03-17 DIAGNOSIS — E1165 Type 2 diabetes mellitus with hyperglycemia: Principal | ICD-10-CM

## 2014-03-17 DIAGNOSIS — I10 Essential (primary) hypertension: Secondary | ICD-10-CM

## 2014-03-17 MED ORDER — INSULIN PEN NEEDLE 32G X 4 MM MISC: Status: DC

## 2014-03-17 NOTE — Progress Notes (Signed)
Subjective:    Patient ID: Anthony Bullock, male    DOB: 1960-03-10, 54 y.o.   MRN: [id number]  HPI Patient seen for medical follow-up. He has multiple chronic problems including poorly controlled type 2 diabetes, stage IV chronic kidney disease,  cardiomyopathy, history of chronic systolic heart failure, CAD.  Diabetes has been poorly controlled. History of poor compliance. He does not monitor sugars regularly which has made adjustment of insulin very difficult. Currently on Lantus 55 units once daily. Has Humalog sliding scale with meals but generally only taking this about once per day. Recently has had some fastings around 200.  Peripheral edema issues have been controlled with furosemide but he has been hesitating to take this days he works because of urine frequency. He does monitor his weights at home and has been stable around 218-220 pounds. Denies any recent orthopnea or increased dyspnea. Blood pressures been stable  Chronic kidney disease followed by nephrology  Past Medical History  Diagnosis Date  . Allergic rhinitis   . Diabetes mellitus type II   . Hyperlipidemia   . Hypertension   . Urethral stricture   . Kidney stones   . Myocardial infarction     "stress test showed light heart attack" on 01/16/13  . Chronic systolic heart failure     a. ECHO (12/2012) EF 25-30%, HK entireanteroseptal myocardium b. Cath 01/31/13 RA 6, RV 48/5/7, PA 47/22 (33), PCW 20, Fick CO/CI 5.2/2.5, PVR 2.5  . CHF (congestive heart failure)   . Non-ischemic cardiomyopathy   . Renal insufficiency    Past Surgical History  Procedure Laterality Date  . Reapea urethral surgery for recurrent obstruction  2011  . Total knee arthroplasty  2007  . Left a nd right heart cath  01/30/2013    DR BENSIHMON  . Cardiac defibrillator placement  06/27/2013    Sub Q       BY DR Caryl Comes  . Left and right heart catheterization with coronary angiogram N/A 01/30/2013    Procedure: LEFT AND RIGHT HEART CATHETERIZATION  WITH CORONARY ANGIOGRAM;  Surgeon: Jolaine Artist, MD;  Location: [facility] CATH LAB;  Service: Cardiovascular;  Laterality: N/A;  . Implantable cardioverter defibrillator implant N/A 06/27/2013    Procedure: SUB Q ICD;  Surgeon: Deboraha Sprang, MD;  Location: [facility] CATH LAB;  Service: Cardiovascular;  Laterality: N/A;    reports that he quit smoking about 3 years ago. His smoking use included Cigarettes. He has a 64 pack-year smoking history. He has never used smokeless tobacco. He reports that he does not drink alcohol or use illicit drugs. family history includes Alcohol abuse in his other; Anemia in his sister; Diabetes in his other; Heart disease in his other; Stroke in his other. Allergies  Allergen Reactions  . Morphine Sulfate Rash    REACTION: Itches all over, red spots      Review of Systems  Constitutional: Negative for fatigue.  Eyes: Negative for visual disturbance.  Respiratory: Negative for cough, chest tightness and shortness of breath.   Cardiovascular: Positive for leg swelling. Negative for chest pain and palpitations.  Genitourinary: Negative for dysuria.  Neurological: Negative for dizziness, syncope, weakness, light-headedness and headaches.       Objective:   Physical Exam  Constitutional: He appears well-developed and well-nourished.  Cardiovascular: Normal rate and regular rhythm.   Pulmonary/Chest: Effort normal and breath sounds normal. No respiratory distress. He has no wheezes. He has no rales.  Musculoskeletal: He exhibits edema.  1+ pitting  edema lower legs bilaterally  Skin:  No foot lesions area both feet are warm to touch          Assessment & Plan:  #1 type 2 diabetes. History poor compliance. Poor control. Recheck A1c in one month. We have strongly advised that he check more pre-prandial blood sugars and step up his frequency of Humalog. #2 hypertension stable. # 3 chronic kidney disease followed by nephrology.  #4 chronic leg edema. Poor  compliance with furosemide. Currently only taking diuretic 2 days per week. We have suggested he try to take this more frequently during week days.

## 2014-04-09 ENCOUNTER — Encounter: Payer: Self-pay | Admitting: Family Medicine

## 2014-04-09 ENCOUNTER — Ambulatory Visit (INDEPENDENT_AMBULATORY_CARE_PROVIDER_SITE_OTHER): Payer: BLUE CROSS/BLUE SHIELD | Admitting: Family Medicine

## 2014-04-09 VITALS — BP 140/78 | HR 77 | Temp 98.4°F | Wt 209.0 lb

## 2014-04-09 DIAGNOSIS — B349 Viral infection, unspecified: Secondary | ICD-10-CM | POA: Diagnosis not present

## 2014-04-09 MED ORDER — HYDROCODONE-HOMATROPINE 5-1.5 MG/5ML PO SYRP: 5.0000 mL | ORAL_SOLUTION | Freq: Four times a day (QID) | ORAL | Status: AC | PRN

## 2014-04-09 MED ORDER — FUROSEMIDE 40 MG PO TABS: ORAL_TABLET | ORAL | Status: DC

## 2014-04-09 NOTE — Progress Notes (Signed)
Pre visit review using our clinic review tool, if applicable. No additional management support is needed unless otherwise documented below in the visit note. 

## 2014-04-09 NOTE — Patient Instructions (Signed)
Viral Infections A viral infection can be caused by different types of viruses.Most viral infections are not serious and resolve on their own. However, some infections may cause severe symptoms and may lead to further complications. SYMPTOMS Viruses can frequently cause:  Minor sore throat.  Aches and pains.  Headaches.  Runny nose.  Different types of rashes.  Watery eyes.  Tiredness.  Cough.  Loss of appetite.  Gastrointestinal infections, resulting in nausea, vomiting, and diarrhea. These symptoms do not respond to antibiotics because the infection is not caused by bacteria. However, you might catch a bacterial infection following the viral infection. This is sometimes called a "superinfection." Symptoms of such a bacterial infection may include:  Worsening sore throat with pus and difficulty swallowing.  Swollen neck glands.  Chills and a high or persistent fever.  Severe headache.  Tenderness over the sinuses.  Persistent overall ill feeling (malaise), muscle aches, and tiredness (fatigue).  Persistent cough.  Yellow, green, or brown mucus production with coughing. HOME CARE INSTRUCTIONS   Only take over-the-counter or prescription medicines for pain, discomfort, diarrhea, or fever as directed by your caregiver.  Drink enough water and fluids to keep your urine clear or pale yellow. Sports drinks can provide valuable electrolytes, sugars, and hydration.  Get plenty of rest and maintain proper nutrition. Soups and broths with crackers or rice are fine. SEEK IMMEDIATE MEDICAL CARE IF:   You have severe headaches, shortness of breath, chest pain, neck pain, or an unusual rash.  You have uncontrolled vomiting, diarrhea, or you are unable to keep down fluids.  You or your child has an oral temperature above 102 F (38.9 C), not controlled by medicine.  Your baby is older than 3 months with a rectal temperature of 102 F (38.9 C) or higher.  Your baby is 3  months old or younger with a rectal temperature of 100.4 F (38 C) or higher. MAKE SURE YOU:   Understand these instructions.  Will watch your condition.  Will get help right away if you are not doing well or get worse. Document Released: 10/06/2004 Document Revised: 03/21/2011 Document Reviewed: 05/03/2010 Exit[employer] Patient Information 2015 [employer]. This information is not intended to replace advice given to you by your health care provider. Make sure you discuss any questions you have with your health care provider.  

## 2014-04-09 NOTE — Progress Notes (Signed)
   Subjective:    Patient ID: Anthony Bullock, male    DOB: 05/09/1960, 54 y.o.   MRN: [id number]  HPI Acute visit. Patient seen with onset in the last week of cough, rhinorrhea, fever, body aches, malaise, and increased sinus pressure. He developed last Friday fever up to 102. By Sunday his fever had broken. He still has some body aches and chills. Occasional yellowish nasal discharge. Cough is mostly nonproductive and severe at times. He had vomiting initially the first day but none since then. No diarrhea. Not monitoring blood sugars.  Past Medical History  Diagnosis Date  . Allergic rhinitis   . Diabetes mellitus type II   . Hyperlipidemia   . Hypertension   . Urethral stricture   . Kidney stones   . Myocardial infarction     "stress test showed light heart attack" on 01/16/13  . Chronic systolic heart failure     a. ECHO (12/2012) EF 25-30%, HK entireanteroseptal myocardium b. Cath 01/31/13 RA 6, RV 48/5/7, PA 47/22 (33), PCW 20, Fick CO/CI 5.2/2.5, PVR 2.5  . CHF (congestive heart failure)   . Non-ischemic cardiomyopathy   . Renal insufficiency    Past Surgical History  Procedure Laterality Date  . Reapea urethral surgery for recurrent obstruction  2011  . Total knee arthroplasty  2007  . Left a nd right heart cath  01/30/2013    DR BENSIHMON  . Cardiac defibrillator placement  06/27/2013    Sub Q       BY DR Caryl Comes  . Left and right heart catheterization with coronary angiogram N/A 01/30/2013    Procedure: LEFT AND RIGHT HEART CATHETERIZATION WITH CORONARY ANGIOGRAM;  Surgeon: Jolaine Artist, MD;  Location: [facility] CATH LAB;  Service: Cardiovascular;  Laterality: N/A;  . Implantable cardioverter defibrillator implant N/A 06/27/2013    Procedure: SUB Q ICD;  Surgeon: Deboraha Sprang, MD;  Location: [facility] CATH LAB;  Service: Cardiovascular;  Laterality: N/A;    reports that he quit smoking about 3 years ago. His smoking use included Cigarettes. He has a 64 pack-year smoking history. He has  never used smokeless tobacco. He reports that he does not drink alcohol or use illicit drugs. family history includes Alcohol abuse in his other; Anemia in his sister; Diabetes in his other; Heart disease in his other; Stroke in his other. Allergies  Allergen Reactions  . Morphine Sulfate Rash    REACTION: Itches all over, red spots      Review of Systems  Constitutional: Positive for chills and fatigue.  HENT: Positive for congestion.   Respiratory: Positive for cough.   Gastrointestinal: Negative for abdominal pain and diarrhea.  Genitourinary: Negative for dysuria.  Neurological: Positive for headaches.       Objective:   Physical Exam  Constitutional: He appears well-developed and well-nourished. No distress.  HENT:  Right Ear: External ear normal.  Left Ear: External ear normal.  Mouth/Throat: Oropharynx is clear and moist.  Neck: Neck supple.  Cardiovascular: Normal rate and regular rhythm.   Pulmonary/Chest: Effort normal and breath sounds normal. No respiratory distress. He has no wheezes. He has no rales.          Assessment & Plan:  Viral syndrome with cough. Nonfocal exam. Stay well-hydrated. Hycodan cough syrup 1 teaspoon every 6 hours for severe cough. Work note written from 3/28 through 04/13/2014. Follow-up promptly for any recurrent fever or worsening symptoms

## 2014-04-17 ENCOUNTER — Telehealth: Payer: Self-pay

## 2014-04-17 ENCOUNTER — Other Ambulatory Visit (INDEPENDENT_AMBULATORY_CARE_PROVIDER_SITE_OTHER): Payer: BLUE CROSS/BLUE SHIELD

## 2014-04-17 DIAGNOSIS — I10 Essential (primary) hypertension: Secondary | ICD-10-CM | POA: Diagnosis not present

## 2014-04-17 DIAGNOSIS — E1165 Type 2 diabetes mellitus with hyperglycemia: Secondary | ICD-10-CM

## 2014-04-17 DIAGNOSIS — E1129 Type 2 diabetes mellitus with other diabetic kidney complication: Secondary | ICD-10-CM

## 2014-04-17 LAB — BASIC METABOLIC PANEL
BUN: 26 mg/dL — ABNORMAL HIGH (ref 6–23)
CO2: 28 mEq/L (ref 19–32)
Calcium: 8.4 mg/dL (ref 8.4–10.5)
Chloride: 101 mEq/L (ref 96–112)
Creatinine, Ser: 4.49 mg/dL — ABNORMAL HIGH (ref 0.40–1.50)
GFR: 14.71 mL/min — CL (ref >60.00)
Glucose, Bld: 386 mg/dL — ABNORMAL HIGH (ref 70–99)
Potassium: 3.9 mEq/L (ref 3.5–5.1)
Sodium: 133 mEq/L — ABNORMAL LOW (ref 135–145)

## 2014-04-17 LAB — HEMOGLOBIN A1C: Hgb A1c MFr Bld: 16.9 % — ABNORMAL HIGH (ref 4.6–6.5)

## 2014-04-17 NOTE — Telephone Encounter (Signed)
GFR high at 14.7.  This will be faxed over.

## 2014-04-17 NOTE — Telephone Encounter (Signed)
Left message for patient to return call.

## 2014-04-17 NOTE — Telephone Encounter (Signed)
Pt is seeing nephrology and we need to make sure they get this lab-and that he gets in to see them as soon as possible.  Creatinine has jumped from 3.8 on 01-15-14 to 4.49.

## 2014-04-18 ENCOUNTER — Other Ambulatory Visit: Payer: Self-pay

## 2014-04-18 NOTE — Telephone Encounter (Signed)
Pt informed. Faxed papers over to Dr. Posey Pronto office and there office is aware.

## 2014-06-20 ENCOUNTER — Encounter: Payer: BLUE CROSS/BLUE SHIELD | Admitting: Family Medicine

## 2014-06-20 DIAGNOSIS — Z0289 Encounter for other administrative examinations: Secondary | ICD-10-CM

## 2014-06-22 ENCOUNTER — Other Ambulatory Visit: Payer: Self-pay | Admitting: Family Medicine

## 2014-06-22 NOTE — Progress Notes (Signed)
This encounter was created in error - please disregard.

## 2014-06-27 ENCOUNTER — Encounter: Payer: Self-pay | Admitting: Family Medicine

## 2014-06-27 ENCOUNTER — Ambulatory Visit (INDEPENDENT_AMBULATORY_CARE_PROVIDER_SITE_OTHER): Payer: BLUE CROSS/BLUE SHIELD | Admitting: Family Medicine

## 2014-06-27 ENCOUNTER — Telehealth: Payer: Self-pay

## 2014-06-27 VITALS — BP 130/70 | HR 83 | Temp 98.6°F | Wt 221.0 lb

## 2014-06-27 DIAGNOSIS — E1129 Type 2 diabetes mellitus with other diabetic kidney complication: Secondary | ICD-10-CM | POA: Diagnosis not present

## 2014-06-27 DIAGNOSIS — E785 Hyperlipidemia, unspecified: Secondary | ICD-10-CM

## 2014-06-27 DIAGNOSIS — I10 Essential (primary) hypertension: Secondary | ICD-10-CM

## 2014-06-27 DIAGNOSIS — N184 Chronic kidney disease, stage 4 (severe): Secondary | ICD-10-CM | POA: Diagnosis not present

## 2014-06-27 DIAGNOSIS — E1165 Type 2 diabetes mellitus with hyperglycemia: Principal | ICD-10-CM

## 2014-06-27 LAB — BASIC METABOLIC PANEL
BUN: 56 mg/dL — ABNORMAL HIGH (ref 6–23)
CO2: 24 mEq/L (ref 19–32)
Calcium: 8.1 mg/dL — ABNORMAL LOW (ref 8.4–10.5)
Chloride: 100 mEq/L (ref 96–112)
Creatinine, Ser: 5.24 mg/dL (ref 0.40–1.50)
GFR: 12.3 mL/min — CL (ref >60.00)
Glucose, Bld: 478 mg/dL — ABNORMAL HIGH (ref 70–99)
Potassium: 4.6 mEq/L (ref 3.5–5.1)
Sodium: 130 mEq/L — ABNORMAL LOW (ref 135–145)

## 2014-06-27 LAB — LIPID PANEL
Cholesterol: 176 mg/dL (ref 0–200)
HDL: 40 mg/dL (ref >39.00)
LDL Cholesterol: 109 mg/dL — ABNORMAL HIGH (ref 0–99)
NonHDL: 136
Total CHOL/HDL Ratio: 4
Triglycerides: 137 mg/dL (ref 0.0–149.0)
VLDL: 27.4 mg/dL (ref 0.0–40.0)

## 2014-06-27 LAB — HEPATIC FUNCTION PANEL
ALT: 15 U/L (ref 0–53)
AST: 18 U/L (ref 0–37)
Albumin: 2.9 g/dL — ABNORMAL LOW (ref 3.5–5.2)
Alkaline Phosphatase: 75 U/L (ref 39–117)
Bilirubin, Direct: 0 mg/dL (ref 0.0–0.3)
Total Bilirubin: 0.3 mg/dL (ref 0.2–1.2)
Total Protein: 5.9 g/dL — ABNORMAL LOW (ref 6.0–8.3)

## 2014-06-27 LAB — HEMOGLOBIN A1C: Hgb A1c MFr Bld: 12.1 % — ABNORMAL HIGH (ref 4.6–6.5)

## 2014-06-27 NOTE — Telephone Encounter (Signed)
Per Dr. B patient needs to take his medication and to take his insulin with every meal. Informed patient. Patient verbally understands.

## 2014-06-27 NOTE — Progress Notes (Signed)
Pre visit review using our clinic review tool, if applicable. No additional management support is needed unless otherwise documented below in the visit note. 

## 2014-06-27 NOTE — Telephone Encounter (Signed)
Critical lab: Creatinine 5.24, GFR 12.29, BUN 56, Glucose 478

## 2014-06-27 NOTE — Progress Notes (Signed)
Subjective:    Patient ID: Anthony Bullock, male    DOB: 01-07-1961, 54 y.o.   MRN: [id number]  HPI Patient here for three-month follow-up. He has multiple chronic problems including hypertension, type 2 diabetes poorly controlled with history of very poor compliance, stage IV kidney disease, nonischemic cardiomyopathy, history of systolic heart failure.  Type 2 diabetes. Very poor compliance. Still drinks sodas but has scaled back. His wife states he had some poor compliance with food as well-esp starchy snacks. He's taking Humalog but only one meal per day. He's been reluctant to take this at work. He has titrated his Lantus up to 68 units. Fasting blood sugars around 150. His last A1c was over 16%. His numbers fluctuate greatly but his compliance has also fluctuated tremendously over time. He is not checking frequent preprandial or postprandial blood sugars. Rare hypoglycemia  Hyperlipidemia currently treated with atorvastatin and fenofibrate. No myalgias. Denies recent chest pains.  Blood pressures have been stable. No dizziness.  Past Medical History  Diagnosis Date  . Allergic rhinitis   . Diabetes mellitus type II   . Hyperlipidemia   . Hypertension   . Urethral stricture   . Kidney stones   . Myocardial infarction     "stress test showed light heart attack" on 01/16/13  . Chronic systolic heart failure     a. ECHO (12/2012) EF 25-30%, HK entireanteroseptal myocardium b. Cath 01/31/13 RA 6, RV 48/5/7, PA 47/22 (33), PCW 20, Fick CO/CI 5.2/2.5, PVR 2.5  . CHF (congestive heart failure)   . Non-ischemic cardiomyopathy   . Renal insufficiency    Past Surgical History  Procedure Laterality Date  . Reapea urethral surgery for recurrent obstruction  2011  . Total knee arthroplasty  2007  . Left a nd right heart cath  01/30/2013    DR Anthony Bullock  . Cardiac defibrillator placement  06/27/2013    Sub Q       BY DR Anthony Bullock  . Left and right heart catheterization with coronary angiogram N/A  01/30/2013    Procedure: LEFT AND RIGHT HEART CATHETERIZATION WITH CORONARY ANGIOGRAM;  Surgeon: Anthony Artist, MD;  Location: [facility] CATH LAB;  Service: Cardiovascular;  Laterality: N/A;  . Implantable cardioverter defibrillator implant N/A 06/27/2013    Procedure: SUB Q ICD;  Surgeon: Anthony Sprang, MD;  Location: [facility] CATH LAB;  Service: Cardiovascular;  Laterality: N/A;    reports that he quit smoking about 4 years ago. His smoking use included Cigarettes. He has a 64 pack-year smoking history. He has never used smokeless tobacco. He reports that he does not drink alcohol or use illicit drugs. family history includes Alcohol abuse in his other; Anemia in his sister; Diabetes in his other; Heart disease in his other; Stroke in his other. Allergies  Allergen Reactions  . Morphine Sulfate Rash    REACTION: Itches all over, red spots      Review of Systems  Constitutional: Negative for fatigue.  Eyes: Negative for visual disturbance.  Respiratory: Negative for cough, chest tightness and shortness of breath.   Cardiovascular: Negative for chest pain, palpitations and leg swelling.  Neurological: Negative for dizziness, syncope, weakness, light-headedness and headaches.       Objective:   Physical Exam  Constitutional: He is oriented to person, place, and time. He appears well-developed and well-nourished.  HENT:  Right Ear: External ear normal.  Left Ear: External ear normal.  Mouth/Throat: Oropharynx is clear and moist.  Eyes: Pupils are equal, round,  and reactive to light.  Neck: Neck supple. No thyromegaly present.  Cardiovascular: Normal rate and regular rhythm.   Pulmonary/Chest: Effort normal and breath sounds normal. No respiratory distress. He has no wheezes. He has no rales.  Musculoskeletal: He exhibits no edema.  Neurological: He is alert and oriented to person, place, and time.  Skin:  Feet reveal no skin lesions. Good distal foot pulses. Good capillary refill. No  calluses. Normal sensation with monofilament testing           Assessment & Plan:  #1 type 2 diabetes. History of very poor control and very poor compliance. We've recommend he check more frequent pre-and postprandial blood sugars. Repeat A1c. We recommended more frequent use of Humalog with each meal though he is reluctant. He has had education with [facility] but continues to make poor food choices. #2 hyperlipidemia. Repeat lipid and hepatic panel #3 chronic kidney disease. He's been reluctant to follow-up with nephrology and we've strongly encouraged him to keep regular follow-up there. Recheck basic metabolic panel today

## 2014-07-02 ENCOUNTER — Telehealth: Payer: Self-pay | Admitting: *Deleted

## 2014-07-02 ENCOUNTER — Encounter: Payer: Self-pay | Admitting: *Deleted

## 2014-07-02 NOTE — Telephone Encounter (Signed)
called for fm hx & status.Marland KitchenMarland Kitchen

## 2014-07-04 ENCOUNTER — Encounter: Payer: Self-pay | Admitting: Internal Medicine

## 2014-07-04 ENCOUNTER — Ambulatory Visit (INDEPENDENT_AMBULATORY_CARE_PROVIDER_SITE_OTHER): Payer: BLUE CROSS/BLUE SHIELD | Admitting: Internal Medicine

## 2014-07-04 VITALS — BP 138/66 | HR 75 | Ht 70.0 in | Wt 226.0 lb

## 2014-07-04 DIAGNOSIS — I5022 Chronic systolic (congestive) heart failure: Secondary | ICD-10-CM | POA: Diagnosis not present

## 2014-07-04 DIAGNOSIS — Z4502 Encounter for adjustment and management of automatic implantable cardiac defibrillator: Secondary | ICD-10-CM | POA: Diagnosis not present

## 2014-07-04 NOTE — Patient Instructions (Signed)
Medication Instructions:  Your physician has recommended you make the following change in your medication:  1) STOP Spironolactone  Labwork: None ordered  Testing/Procedures: None ordered  Follow-Up: Your physician recommends that you schedule a follow-up appointment in: 3 months with device clinic.  Your physician wants you to follow-up in: 1 year with Dr. Caryl Comes.  You will receive a reminder letter in the mail two months in advance. If you don't receive a letter, please call our office to schedule the follow-up appointment.  Thank you for choosing [facility]!!

## 2014-07-04 NOTE — Addendum Note (Signed)
Addended by: Stanton Kidney on: 07/04/2014 02:27 PM   Modules accepted: Orders, Medications, Level of Service

## 2014-07-04 NOTE — Progress Notes (Signed)
Patient Care Team: [name], MD as PCP - General   HPI  Anthony Bullock is a 54 y.o. male Seen in follow-up for ICD implantation-subcutaneous. (6/15) He has a history of nonischemic cardiomyopathy renal insufficiency.  The patient denies chest pain, shortness of breath, nocturnal dyspnea, orthopnea  He was having some problems with edema. His PCP increased his diuretics.   There have been no palpitations, lightheadedness or syncope.   No device discharges  Past Medical History  Diagnosis Date  . Allergic rhinitis   . Diabetes mellitus type II   . Hyperlipidemia   . Hypertension   . Urethral stricture   . Kidney stones   . Myocardial infarction     "stress test showed light heart attack" on 01/16/13  . Chronic systolic heart failure     a. ECHO (12/2012) EF 25-30%, HK entireanteroseptal myocardium b. Cath 01/31/13 RA 6, RV 48/5/7, PA 47/22 (33), PCW 20, Fick CO/CI 5.2/2.5, PVR 2.5  . CHF (congestive heart failure)   . Non-ischemic cardiomyopathy   . Renal insufficiency     Past Surgical History  Procedure Laterality Date  . Reapea urethral surgery for recurrent obstruction  2011  . Total knee arthroplasty  2007  . Left a nd right heart cath  01/30/2013    DR BENSIHMON  . Cardiac defibrillator placement  06/27/2013    Sub Q       BY DR Caryl Comes  . Left and right heart catheterization with coronary angiogram N/A 01/30/2013    Procedure: LEFT AND RIGHT HEART CATHETERIZATION WITH CORONARY ANGIOGRAM;  Surgeon: Jolaine Artist, MD;  Location: [facility] CATH LAB;  Service: Cardiovascular;  Laterality: N/A;  . Implantable cardioverter defibrillator implant N/A 06/27/2013    Procedure: SUB Q ICD;  Surgeon: Deboraha Sprang, MD;  Location: [facility] CATH LAB;  Service: Cardiovascular;  Laterality: N/A;    Current Outpatient Prescriptions  Medication Sig Dispense Refill  . amLODipine (NORVASC) 2.5 MG tablet Take 1 tablet (2.5 mg total) by mouth daily. 90 tablet 3  . aspirin EC 81 MG  tablet Take 162 mg by mouth daily.    [name] atorvastatin (LIPITOR) 80 MG tablet Take 1 tablet (80 mg total) by mouth at bedtime. 90 tablet 1  . azelastine (ASTELIN) 0.1 % nasal spray Place 2 sprays into both nostrils 3 (three) times daily as needed for rhinitis. Use in each nostril as directed 30 mL 12  . carvedilol (COREG) 25 MG tablet Take 1 tablet (25 mg total) by mouth 2 (two) times daily with a meal. 180 tablet 3  . fenofibrate 160 MG tablet TAKE 1 TABLET DAILY 90 tablet 1  . fexofenadine (ALLEGRA) 180 MG tablet Take 180 mg by mouth daily.    . furosemide (LASIX) 40 MG tablet Take 2 tablets in the morning and 1 tablet in the evening. 270 tablet 3  . gabapentin (NEURONTIN) 300 MG capsule Take 1 capsule (300 mg total) by mouth 3 (three) times daily. 270 capsule 3  . glucose blood test strip Check 1 time daily. E11.9 One Touch Ultra Blue Test Strips 100 each 3  . HYDROcodone-acetaminophen (NORCO/VICODIN) 5-325 MG per tablet Take 1 tablet by mouth every 6 (six) hours as needed for moderate pain. 60 tablet 0  . insulin glargine (LANTUS) 100 UNIT/ML injection Inject 0.5 mLs (50 Units total) into the skin daily. (Patient taking differently: Inject 60 Units into the skin daily. Titrate 2 units every 3 days.) 10 mL 5  .  insulin lispro (HUMALOG) 100 UNIT/ML injection Inject 0-14 Units into the skin 3 (three) times daily as needed for high blood sugar (per sliding scale).    . Insulin Pen Needle 32G X 4 MM MISC Check 1 time daily. E11.9 100 each 3  . Olopatadine HCl (PATADAY) 0.2 % SOLN Place 1 drop into both eyes daily as needed (for allergies).     [name] spironolactone (ALDACTONE) 25 MG tablet Take 1 tablet (25 mg total) by mouth daily. 90 tablet 3  . Vitamin D, Ergocalciferol, (DRISDOL) 50000 UNITS CAPS capsule Take 50,000 Units by mouth every 7 (seven) days.      No current facility-administered medications for this visit.    Allergies  Allergen Reactions  . Morphine Sulfate Rash    REACTION: Itches all  over, red spots    Review of Systems negative except from HPI and PMH  Physical Exam BP 138/66 mmHg  Pulse 75  Ht 5\' 10"  (1.778 m)  Wt 226 lb (102.513 kg)  BMI 32.43 kg/m2 Well developed and well nourished in no acute distress HENT normal E scleral and icterus clear Neck Supple JVP flat; carotids brisk and full Clear to ausculation Device pocket well healed; without hematoma or erythema.  There is no tethering there was a small pustular-like lesion at the cephalad aspect of the lateral incision. I explored it and found some whitish firm material I don't whether it is related to the suture or not it was removed regular rate and rhythm, no murmurs gallops or rub Soft with active bowel sounds No clubbing cyanosis  Edema Alert and oriented, grossly normal motor and sensory function Skin Warm and Dry    Assessment and  Plan  ICD-subcutaneous  Nonischemic cardiomyopathy  Renal insufficiency  Cr  5.25 << 4.5 4/16  Device function is normal.  Mild volume overload  Functional status is stable  Renal function deteriorating   wil contact renal office to facilitate appointment  We will stop his Aldactone with renal dysfunction

## 2014-07-07 ENCOUNTER — Other Ambulatory Visit: Payer: Self-pay

## 2014-07-15 ENCOUNTER — Other Ambulatory Visit (HOSPITAL_COMMUNITY): Payer: Self-pay | Admitting: *Deleted

## 2014-07-16 ENCOUNTER — Encounter (HOSPITAL_COMMUNITY)
Admission: RE | Admit: 2014-07-16 | Discharge: 2014-07-16 | Disposition: A | Discharge status: Home / Self Care | Payer: BLUE CROSS/BLUE SHIELD | Source: Ambulatory Visit | Attending: Nephrology | Admitting: Nephrology

## 2014-07-16 DIAGNOSIS — N183 Chronic kidney disease, stage 3 (moderate): Secondary | ICD-10-CM | POA: Insufficient documentation

## 2014-07-16 DIAGNOSIS — Z79899 Other long term (current) drug therapy: Secondary | ICD-10-CM | POA: Insufficient documentation

## 2014-07-16 DIAGNOSIS — D631 Anemia in chronic kidney disease: Secondary | ICD-10-CM | POA: Diagnosis not present

## 2014-07-16 DIAGNOSIS — D509 Iron deficiency anemia, unspecified: Secondary | ICD-10-CM | POA: Insufficient documentation

## 2014-07-16 DIAGNOSIS — Z5181 Encounter for therapeutic drug level monitoring: Secondary | ICD-10-CM | POA: Insufficient documentation

## 2014-07-16 LAB — POCT HEMOGLOBIN-HEMACUE: Hemoglobin: 8 g/dL — ABNORMAL LOW (ref 13.0–17.0)

## 2014-07-16 MED ORDER — EPOETIN ALFA 10000 UNIT/ML IJ SOLN
INTRAMUSCULAR | Status: AC
  Filled 2014-07-16: qty 1

## 2014-07-16 MED ORDER — EPOETIN ALFA 10000 UNIT/ML IJ SOLN
10000.0000 [IU] | INTRAMUSCULAR | Status: DC
  Administered 2014-07-16: 10000 [IU] via SUBCUTANEOUS

## 2014-07-16 MED ORDER — SODIUM CHLORIDE 0.9 % IV SOLN
510.0000 mg | INTRAVENOUS | Status: DC
  Administered 2014-07-16: 510 mg via INTRAVENOUS
  Filled 2014-07-16: qty 17

## 2014-07-16 NOTE — Discharge Instructions (Signed)
Ferumoxytol injection What is this medicine? FERUMOXYTOL is an iron complex. Iron is used to make healthy red blood cells, which carry oxygen and nutrients throughout the body. This medicine is used to treat iron deficiency anemia in people with chronic kidney disease. This medicine may be used for other purposes; ask your health care provider or pharmacist if you have questions. COMMON BRAND NAME(S): Feraheme What should I tell my health care provider before I take this medicine? They need to know if you have any of these conditions: -anemia not caused by low iron levels -high levels of iron in the blood -magnetic resonance imaging (MRI) test scheduled -an unusual or allergic reaction to iron, other medicines, foods, dyes, or preservatives -pregnant or trying to get pregnant -breast-feeding How should I use this medicine? This medicine is for injection into a vein. It is given by a health care professional in a hospital or clinic setting. Talk to your pediatrician regarding the use of this medicine in children. Special care may be needed. Overdosage: If you think you've taken too much of this medicine contact a poison control center or emergency room at once. Overdosage: If you think you have taken too much of this medicine contact a poison control center or emergency room at once. NOTE: This medicine is only for you. Do not share this medicine with others. What if I miss a dose? It is important not to miss your dose. Call your doctor or health care professional if you are unable to keep an appointment. What may interact with this medicine? This medicine may interact with the following medications: -other iron products This list may not describe all possible interactions. Give your health care provider a list of all the medicines, herbs, non-prescription drugs, or dietary supplements you use. Also tell them if you smoke, drink alcohol, or use illegal drugs. Some items may interact with your  medicine. What should I watch for while using this medicine? Visit your doctor or healthcare professional regularly. Tell your doctor or healthcare professional if your symptoms do not start to get better or if they get worse. You may need blood work done while you are taking this medicine. You may need to follow a special diet. Talk to your doctor. Foods that contain iron include: whole grains/cereals, dried fruits, beans, or peas, leafy green vegetables, and organ meats (liver, kidney). What side effects may I notice from receiving this medicine? Side effects that you should report to your doctor or health care professional as soon as possible: -allergic reactions like skin rash, itching or hives, swelling of the face, lips, or tongue -breathing problems -changes in blood pressure -feeling faint or lightheaded, falls -fever or chills -flushing, sweating, or hot feelings -swelling of the ankles or feet Side effects that usually do not require medical attention (Report these to your doctor or health care professional if they continue or are bothersome.): -diarrhea -headache -nausea, vomiting -stomach pain This list may not describe all possible side effects. Call your doctor for medical advice about side effects. You may report side effects to FDA at 1-800-FDA-1088. Where should I keep my medicine? This drug is given in a hospital or clinic and will not be stored at home. NOTE: This sheet is a summary. It may not cover all possible information. If you have questions about this medicine, talk to your doctor, pharmacist, or health care provider.  2015, Elsevier/Gold Standard. (2011-08-12 15:23:36) Epoetin Alfa injection What is this medicine? EPOETIN ALFA (e POE e tin AL  fa) helps your body make more red blood cells. This medicine is used to treat anemia caused by chronic kidney failure, cancer chemotherapy, or HIV-therapy. It may also be used before surgery if you have anemia. This medicine  may be used for other purposes; ask your health care provider or pharmacist if you have questions. COMMON BRAND NAME(S): Epogen, Procrit What should I tell my health care provider before I take this medicine? They need to know if you have any of these conditions: -blood clotting disorders -cancer patient not on chemotherapy -cystic fibrosis -heart disease, such as angina or heart failure -hemoglobin level of 12 g/dL or greater -high blood pressure -low levels of folate, iron, or vitamin B12 -seizures -an unusual or allergic reaction to erythropoietin, albumin, benzyl alcohol, hamster proteins, other medicines, foods, dyes, or preservatives -pregnant or trying to get pregnant -breast-feeding How should I use this medicine? This medicine is for injection into a vein or under the skin. It is usually given by a health care professional in a hospital or clinic setting. If you get this medicine at home, you will be taught how to prepare and give this medicine. Use exactly as directed. Take your medicine at regular intervals. Do not take your medicine more often than directed. It is important that you put your used needles and syringes in a special sharps container. Do not put them in a trash can. If you do not have a sharps container, call your pharmacist or healthcare provider to get one. Talk to your pediatrician regarding the use of this medicine in children. While this drug may be prescribed for selected conditions, precautions do apply. Overdosage: If you think you have taken too much of this medicine contact a poison control center or emergency room at once. NOTE: This medicine is only for you. Do not share this medicine with others. What if I miss a dose? If you miss a dose, take it as soon as you can. If it is almost time for your next dose, take only that dose. Do not take double or extra doses. What may interact with this medicine? Do not take this medicine with any of the following  medications: -darbepoetin alfa This list may not describe all possible interactions. Give your health care provider a list of all the medicines, herbs, non-prescription drugs, or dietary supplements you use. Also tell them if you smoke, drink alcohol, or use illegal drugs. Some items may interact with your medicine. What should I watch for while using this medicine? Visit your prescriber or health care professional for regular checks on your progress and for the needed blood tests and blood pressure measurements. It is especially important for the doctor to make sure your hemoglobin level is in the desired range, to limit the risk of potential side effects and to give you the best benefit. Keep all appointments for any recommended tests. Check your blood pressure as directed. Ask your doctor what your blood pressure should be and when you should contact him or her. As your body makes more red blood cells, you may need to take iron, folic acid, or vitamin B supplements. Ask your doctor or health care provider which products are right for you. If you have kidney disease continue dietary restrictions, even though this medication can make you feel better. Talk with your doctor or health care professional about the foods you eat and the vitamins that you take. What side effects may I notice from receiving this medicine? Side effects that you  should report to your doctor or health care professional as soon as possible: -allergic reactions like skin rash, itching or hives, swelling of the face, lips, or tongue -breathing problems -changes in vision -chest pain -confusion, trouble speaking or understanding -feeling faint or lightheaded, falls -high blood pressure -muscle aches or pains -pain, swelling, warmth in the leg -rapid weight gain -severe headaches -sudden numbness or weakness of the face, arm or leg -trouble walking, dizziness, loss of balance or coordination -seizures (convulsions) -swelling  of the ankles, feet, hands -unusually weak or tired Side effects that usually do not require medical attention (report to your doctor or health care professional if they continue or are bothersome): -diarrhea -fever, chills (flu-like symptoms) -headaches -nausea, vomiting -redness, stinging, or swelling at site where injected This list may not describe all possible side effects. Call your doctor for medical advice about side effects. You may report side effects to FDA at 1-800-FDA-1088. Where should I keep my medicine? Keep out of the reach of children. Store in a refrigerator between 2 and 8 degrees C (36 and 46 degrees F). Do not freeze or shake. Throw away any unused portion if using a single-dose vial. Multi-dose vials can be kept in the refrigerator for up to 21 days after the initial dose. Throw away unused medicine. NOTE: This sheet is a summary. It may not cover all possible information. If you have questions about this medicine, talk to your doctor, pharmacist, or health care provider.  2015, Elsevier/Gold Standard. (2007-12-11 10:25:44)

## 2014-07-17 ENCOUNTER — Encounter: Payer: Self-pay | Admitting: Internal Medicine

## 2014-07-22 ENCOUNTER — Other Ambulatory Visit: Payer: Self-pay | Admitting: *Deleted

## 2014-07-22 DIAGNOSIS — N184 Chronic kidney disease, stage 4 (severe): Secondary | ICD-10-CM

## 2014-07-22 DIAGNOSIS — Z01810 Encounter for preprocedural cardiovascular examination: Secondary | ICD-10-CM

## 2014-07-25 ENCOUNTER — Encounter (HOSPITAL_COMMUNITY)
Admission: RE | Admit: 2014-07-25 | Discharge: 2014-07-25 | Disposition: A | Discharge status: Home / Self Care | Payer: BLUE CROSS/BLUE SHIELD | Source: Ambulatory Visit | Attending: Nephrology | Admitting: Nephrology

## 2014-07-25 DIAGNOSIS — D509 Iron deficiency anemia, unspecified: Secondary | ICD-10-CM | POA: Insufficient documentation

## 2014-07-25 MED ORDER — SODIUM CHLORIDE 0.9 % IV SOLN
510.0000 mg | INTRAVENOUS | Status: AC
  Administered 2014-07-25: 510 mg via INTRAVENOUS
  Filled 2014-07-25: qty 17

## 2014-08-01 ENCOUNTER — Encounter (HOSPITAL_COMMUNITY)
Admission: RE | Admit: 2014-08-01 | Discharge: 2014-08-01 | Disposition: A | Discharge status: Home / Self Care | Payer: BLUE CROSS/BLUE SHIELD | Source: Ambulatory Visit | Attending: Nephrology | Admitting: Nephrology

## 2014-08-01 DIAGNOSIS — N183 Chronic kidney disease, stage 3 (moderate): Secondary | ICD-10-CM | POA: Diagnosis not present

## 2014-08-01 LAB — POCT HEMOGLOBIN-HEMACUE: Hemoglobin: 9.4 g/dL — ABNORMAL LOW (ref 13.0–17.0)

## 2014-08-01 MED ORDER — EPOETIN ALFA 10000 UNIT/ML IJ SOLN
10000.0000 [IU] | INTRAMUSCULAR | Status: DC
  Administered 2014-08-01: 10000 [IU] via SUBCUTANEOUS

## 2014-08-01 MED ORDER — EPOETIN ALFA 10000 UNIT/ML IJ SOLN
INTRAMUSCULAR | Status: AC
  Filled 2014-08-01: qty 1

## 2014-08-11 ENCOUNTER — Encounter: Payer: Self-pay | Admitting: Vascular Surgery

## 2014-08-13 ENCOUNTER — Encounter: Payer: Self-pay | Admitting: Vascular Surgery

## 2014-08-13 ENCOUNTER — Ambulatory Visit (INDEPENDENT_AMBULATORY_CARE_PROVIDER_SITE_OTHER)
Admission: RE | Admit: 2014-08-13 | Discharge: 2014-08-13 | Disposition: A | Discharge status: Home / Self Care | Payer: BLUE CROSS/BLUE SHIELD | Source: Ambulatory Visit | Attending: Vascular Surgery | Admitting: Vascular Surgery

## 2014-08-13 ENCOUNTER — Ambulatory Visit (INDEPENDENT_AMBULATORY_CARE_PROVIDER_SITE_OTHER): Payer: BLUE CROSS/BLUE SHIELD | Admitting: Vascular Surgery

## 2014-08-13 ENCOUNTER — Ambulatory Visit (HOSPITAL_COMMUNITY)
Admission: RE | Admit: 2014-08-13 | Discharge: 2014-08-13 | Disposition: A | Discharge status: Home / Self Care | Payer: BLUE CROSS/BLUE SHIELD | Source: Ambulatory Visit | Attending: Vascular Surgery | Admitting: Vascular Surgery

## 2014-08-13 ENCOUNTER — Other Ambulatory Visit: Payer: Self-pay

## 2014-08-13 VITALS — BP 126/70 | HR 66 | Temp 98.2°F | Resp 16 | Ht 71.0 in | Wt 223.0 lb

## 2014-08-13 DIAGNOSIS — Z01810 Encounter for preprocedural cardiovascular examination: Secondary | ICD-10-CM | POA: Diagnosis not present

## 2014-08-13 DIAGNOSIS — N184 Chronic kidney disease, stage 4 (severe): Secondary | ICD-10-CM | POA: Insufficient documentation

## 2014-08-13 NOTE — Progress Notes (Signed)
[facility]  Patient name: Anthony Bullock MRN: [id number] DOB: Sep 24, 1960 Sex: male  REASON FOR CONSULT: Evaluate for hemodialysis access. Referred by Dr. Posey Pronto  HPI: Anthony Bullock is a 54 y.o. male who presents for evaluation for hemodialysis access. He is right-handed. He has a defibrillator on the left side. He is not yet on dialysis. He denies any recent uremic symptoms except fatigue. He denies nausea, vomiting, anorexia, palpitations, or significant shortness of breath.  I have reviewed the records from [facility]. The patient has stage IV chronic kidney disease. His renal function appears to be in gradual decline. GFR on 07/08/2014 was 13  Past Medical History  Diagnosis Date  . Allergic rhinitis   . Diabetes mellitus type II   . Hyperlipidemia   . Hypertension   . Urethral stricture   . Kidney stones   . Myocardial infarction     "stress test showed light heart attack" on 01/16/13  . Chronic systolic heart failure     a. ECHO (12/2012) EF 25-30%, HK entireanteroseptal myocardium b. Cath 01/31/13 RA 6, RV 48/5/7, PA 47/22 (33), PCW 20, Fick CO/CI 5.2/2.5, PVR 2.5  . CHF (congestive heart failure)   . Non-ischemic cardiomyopathy   . Renal insufficiency    Family History  Problem Relation Age of Onset  . Diabetes Other     pt  . Cancer Mother     bladder  . Alcohol abuse Father   . Cancer Father   . Stroke Maternal Grandmother   . Heart Problems Maternal Grandmother     unknown  . Heart disease Maternal Grandfather   . Prostate cancer Maternal Grandfather    SOCIAL HISTORY: History  Substance Use Topics  . Smoking status: Former Smoker -- 2.00 packs/day for 32 years    Types: Cigarettes    Quit date: 05/11/2010  . Smokeless tobacco: Never Used  . Alcohol Use: No   Allergies  Allergen Reactions  . Morphine Sulfate Rash    REACTION: Itches all over, red spots   Current Outpatient Prescriptions  Medication Sig  Dispense Refill  . amLODipine (NORVASC) 2.5 MG tablet Take 1 tablet (2.5 mg total) by mouth daily. 90 tablet 3  . aspirin EC 81 MG tablet Take 162 mg by mouth daily.    [name] atorvastatin (LIPITOR) 80 MG tablet Take 1 tablet (80 mg total) by mouth at bedtime. 90 tablet 1  . azelastine (ASTELIN) 0.1 % nasal spray Place 2 sprays into both nostrils 3 (three) times daily as needed for rhinitis. Use in each nostril as directed 30 mL 12  . carvedilol (COREG) 25 MG tablet Take 1 tablet (25 mg total) by mouth 2 (two) times daily with a meal. 180 tablet 3  . fenofibrate 160 MG tablet TAKE 1 TABLET DAILY 90 tablet 1  . fexofenadine (ALLEGRA) 180 MG tablet Take 180 mg by mouth daily.    . furosemide (LASIX) 40 MG tablet Take 2 tablets in the morning and 1 tablet in the evening. 270 tablet 3  . gabapentin (NEURONTIN) 300 MG capsule Take 1 capsule (300 mg total) by mouth 3 (three) times daily. 270 capsule 3  . glucose blood test strip Check 1 time daily. E11.9 One Touch Ultra Blue Test Strips 100 each 3  . HYDROcodone-acetaminophen (NORCO/VICODIN) 5-325 MG per tablet Take 1 tablet by mouth every 6 (six) hours as needed for moderate pain. 60 tablet 0  . insulin glargine (LANTUS) 100 UNIT/ML injection  Inject 0.5 mLs (50 Units total) into the skin daily. (Patient taking differently: Inject 60 Units into the skin daily. Titrate 2 units every 3 days.) 10 mL 5  . insulin lispro (HUMALOG) 100 UNIT/ML injection Inject 0-14 Units into the skin 3 (three) times daily as needed for high blood sugar (per sliding scale).    . Insulin Pen Needle 32G X 4 MM MISC Check 1 time daily. E11.9 100 each 3  . Olopatadine HCl (PATADAY) 0.2 % SOLN Place 1 drop into both eyes daily as needed (for allergies).     . saxagliptin HCl (ONGLYZA) 5 MG TABS tablet Take 5 mg by mouth daily.    . Vitamin D, Ergocalciferol, (DRISDOL) 50000 UNITS CAPS capsule Take 50,000 Units by mouth every 7 (seven) days.      No current facility-administered  medications for this visit.   REVIEW OF SYSTEMS: Valu.Nieves ] denotes positive finding; [  ] denotes negative finding  CARDIOVASCULAR:  [ ]  chest pain   [ ]  chest pressure   [ ]  palpitations   [ ]  orthopnea   [ ]  dyspnea on exertion   Valu.Nieves ] claudication   Valu.Nieves ] rest pain   [ ]  DVT   [ ]  phlebitis PULMONARY:   [ ]  productive cough   [ ]  asthma   [ ]  wheezing NEUROLOGIC:   [ ]  weakness  [ ]  paresthesias  [ ]  aphasia  [ ]  amaurosis  [ ]  dizziness HEMATOLOGIC:   [ ]  bleeding problems   [ ]  clotting disorders MUSCULOSKELETAL:  [ ]  joint pain   [ ]  joint swelling Valu.Nieves ] leg swelling GASTROINTESTINAL: [ ]   blood in stool  [ ]   hematemesis GENITOURINARY:  [ ]   dysuria  [ ]   hematuria PSYCHIATRIC:  [ ]  history of major depression INTEGUMENTARY:  [ ]  rashes  [ ]  ulcers CONSTITUTIONAL:  [ ]  fever   [ ]  chills  PHYSICAL EXAM: Filed Vitals:   08/13/14 0922 08/13/14 0924  BP: 148/78 126/70  Pulse: 72 66  Temp: 98.2 F (36.8 C)   TempSrc: Oral   Resp: 16   Height: 5\' 11"  (1.803 m)   Weight: 223 lb (101.152 kg)   SpO2: 99%    GENERAL: The patient is a well-nourished male, in no acute distress. The vital signs are documented above. CARDIAC: There is a regular rate and rhythm.  VASCULAR: he has bilateral carotid bruits. He has palpable brachial and radial pulses bilaterally. PULMONARY: There is good air exchange bilaterally without wheezing or rales. ABDOMEN: Soft and non-tender with normal pitched bowel sounds.  MUSCULOSKELETAL: There are no major deformities or cyanosis. NEUROLOGIC: No focal weakness or paresthesias are detected. SKIN: There are no ulcers or rashes noted. PSYCHIATRIC: The patient has a normal affect.  DATA:  I have independently interpreted his vein map which shows that his forearm and upper arm cephalic vein on the right. To be reasonable in size. Likewise his basilic vein looks reasonable in size.  I have independently interpreted his arterial Doppler study shows a triphasic radial  Doppler signal bilaterally and a biphasic ulnar Doppler signal bilaterally.  MEDICAL ISSUES:  STAGE IV CHRONIC KIDNEY DISEASE: Given that he has a defibrillator on the left side I have recommended that we place access in the right arm. He appears to be a reasonable candidate for a radial cephalic or brachial cephalic fistula. I have explained the indications for placement of an AV fistula or AV graft. I've explained that  if at all possible we will place an AV fistula.  I have reviewed the risks of placement of an AV fistula including but not limited to: failure of the fistula to mature, need for subsequent interventions, and thrombosis. In addition I have reviewed the potential complications of placement of an AV graft. These risks include, but are not limited to, graft thrombosis, graft infection, wound healing problems, bleeding, arm swelling, and steal syndrome. All the patient's questions were answered and they are agreeable to proceed with surgery. His surgery is scheduled for 08/22/2014.  BILATERAL CAROTID BRUITS: I will arrange for elective carotid duplex scan to workup his carotid bruits. I have explained to the patient that there is a 30% chance that we would find disease that needs to be followed or addressed.   [name] [facility] [place]: [phone]

## 2014-08-14 ENCOUNTER — Encounter: Payer: Self-pay | Admitting: Nephrology

## 2014-08-14 NOTE — Addendum Note (Signed)
Addended by: Dorthula Rue L on: 08/14/2014 02:30 PM   Modules accepted: Orders

## 2014-08-15 ENCOUNTER — Encounter (HOSPITAL_COMMUNITY)
Admission: RE | Admit: 2014-08-15 | Discharge: 2014-08-15 | Disposition: A | Discharge status: Home / Self Care | Payer: BLUE CROSS/BLUE SHIELD | Source: Ambulatory Visit | Attending: Nephrology | Admitting: Nephrology

## 2014-08-15 DIAGNOSIS — Z5181 Encounter for therapeutic drug level monitoring: Secondary | ICD-10-CM | POA: Insufficient documentation

## 2014-08-15 DIAGNOSIS — N183 Chronic kidney disease, stage 3 (moderate): Secondary | ICD-10-CM | POA: Diagnosis present

## 2014-08-15 DIAGNOSIS — D509 Iron deficiency anemia, unspecified: Secondary | ICD-10-CM | POA: Insufficient documentation

## 2014-08-15 DIAGNOSIS — D631 Anemia in chronic kidney disease: Secondary | ICD-10-CM | POA: Diagnosis not present

## 2014-08-15 DIAGNOSIS — Z79899 Other long term (current) drug therapy: Secondary | ICD-10-CM | POA: Insufficient documentation

## 2014-08-15 MED ORDER — EPOETIN ALFA 10000 UNIT/ML IJ SOLN
INTRAMUSCULAR | Status: AC
  Filled 2014-08-15: qty 1

## 2014-08-15 MED ORDER — EPOETIN ALFA 10000 UNIT/ML IJ SOLN
10000.0000 [IU] | INTRAMUSCULAR | Status: DC
  Administered 2014-08-15: 10000 [IU] via SUBCUTANEOUS

## 2014-08-16 ENCOUNTER — Other Ambulatory Visit: Payer: Self-pay | Admitting: Family Medicine

## 2014-08-18 ENCOUNTER — Ambulatory Visit (HOSPITAL_COMMUNITY)
Admission: RE | Admit: 2014-08-18 | Discharge: 2014-08-18 | Disposition: A | Discharge status: Home / Self Care | Payer: BLUE CROSS/BLUE SHIELD | Source: Ambulatory Visit | Attending: Vascular Surgery | Admitting: Vascular Surgery

## 2014-08-18 ENCOUNTER — Other Ambulatory Visit: Payer: Self-pay | Admitting: Vascular Surgery

## 2014-08-18 DIAGNOSIS — I6523 Occlusion and stenosis of bilateral carotid arteries: Secondary | ICD-10-CM | POA: Insufficient documentation

## 2014-08-18 DIAGNOSIS — R0989 Other specified symptoms and signs involving the circulatory and respiratory systems: Secondary | ICD-10-CM | POA: Diagnosis not present

## 2014-08-18 DIAGNOSIS — N184 Chronic kidney disease, stage 4 (severe): Secondary | ICD-10-CM

## 2014-08-18 LAB — POCT HEMOGLOBIN-HEMACUE: Hemoglobin: 9 g/dL — ABNORMAL LOW (ref 13.0–17.0)

## 2014-08-21 ENCOUNTER — Encounter (HOSPITAL_COMMUNITY): Payer: Self-pay | Admitting: *Deleted

## 2014-08-21 MED ORDER — DEXTROSE 5 % IV SOLN
1.5000 g | INTRAVENOUS | Status: AC
  Administered 2014-08-22: 1.5 g via INTRAVENOUS
  Filled 2014-08-21: qty 1.5

## 2014-08-21 MED ORDER — CHLORHEXIDINE GLUCONATE CLOTH 2 % EX PADS: 6.0000 | MEDICATED_PAD | Freq: Once | CUTANEOUS | Status: DC

## 2014-08-21 MED ORDER — SODIUM CHLORIDE 0.9 % IV SOLN: INTRAVENOUS | Status: DC

## 2014-08-21 NOTE — Progress Notes (Signed)
   08/21/14 1854  OBSTRUCTIVE SLEEP APNEA  Have you ever been diagnosed with sleep apnea through a sleep study? No  Do you snore loudly (loud enough to be heard through closed doors)?  1  Do you often feel tired, fatigued, or sleepy during the daytime? 1  Has anyone observed you stop breathing during your sleep? 0  Do you have, or are you being treated for high blood pressure? 1  BMI more than 35 kg/m2? 0  Age over 53 years old? 1  Neck circumference greater than 40 cm/16 inches? 1  Gender: 1

## 2014-08-21 NOTE — Progress Notes (Signed)
Pt denies SOB and chest pain but is under the care of Dr Ena Dawley, cardiology. Pt stated that he was instructed by MD to take half of HS insulin. Pt instructed not to take any oral diabetic medications the morning of procedure (saxagliptin HCl (ONGLYZA). Pt made aware to stop taking otc vitamins, NSAID's and herbal medications. Pt verbalized understanding of all pre-op instructions.

## 2014-08-22 ENCOUNTER — Encounter (HOSPITAL_COMMUNITY)
Admission: RE | Disposition: A | Discharge status: Home / Self Care | Payer: Self-pay | Source: Ambulatory Visit | Attending: Vascular Surgery

## 2014-08-22 ENCOUNTER — Ambulatory Visit (HOSPITAL_COMMUNITY)
Admission: RE | Admit: 2014-08-22 | Discharge: 2014-08-22 | Disposition: A | Discharge status: Home / Self Care | Payer: BLUE CROSS/BLUE SHIELD | Source: Ambulatory Visit | Attending: Vascular Surgery | Admitting: Vascular Surgery

## 2014-08-22 ENCOUNTER — Encounter (HOSPITAL_COMMUNITY): Payer: Self-pay

## 2014-08-22 ENCOUNTER — Ambulatory Visit (HOSPITAL_COMMUNITY): Payer: BLUE CROSS/BLUE SHIELD | Admitting: Anesthesiology

## 2014-08-22 DIAGNOSIS — Z87891 Personal history of nicotine dependence: Secondary | ICD-10-CM | POA: Insufficient documentation

## 2014-08-22 DIAGNOSIS — I129 Hypertensive chronic kidney disease with stage 1 through stage 4 chronic kidney disease, or unspecified chronic kidney disease: Secondary | ICD-10-CM | POA: Insufficient documentation

## 2014-08-22 DIAGNOSIS — I5022 Chronic systolic (congestive) heart failure: Secondary | ICD-10-CM | POA: Insufficient documentation

## 2014-08-22 DIAGNOSIS — E785 Hyperlipidemia, unspecified: Secondary | ICD-10-CM | POA: Insufficient documentation

## 2014-08-22 DIAGNOSIS — Z794 Long term (current) use of insulin: Secondary | ICD-10-CM | POA: Insufficient documentation

## 2014-08-22 DIAGNOSIS — I429 Cardiomyopathy, unspecified: Secondary | ICD-10-CM | POA: Diagnosis not present

## 2014-08-22 DIAGNOSIS — N184 Chronic kidney disease, stage 4 (severe): Secondary | ICD-10-CM | POA: Insufficient documentation

## 2014-08-22 DIAGNOSIS — Z7982 Long term (current) use of aspirin: Secondary | ICD-10-CM | POA: Diagnosis not present

## 2014-08-22 DIAGNOSIS — R0989 Other specified symptoms and signs involving the circulatory and respiratory systems: Secondary | ICD-10-CM | POA: Insufficient documentation

## 2014-08-22 DIAGNOSIS — I252 Old myocardial infarction: Secondary | ICD-10-CM | POA: Diagnosis not present

## 2014-08-22 DIAGNOSIS — E1122 Type 2 diabetes mellitus with diabetic chronic kidney disease: Secondary | ICD-10-CM | POA: Diagnosis not present

## 2014-08-22 HISTORY — PX: BASCILIC VEIN TRANSPOSITION: SHX5742

## 2014-08-22 HISTORY — DX: Type 2 diabetes mellitus with diabetic neuropathy, unspecified: E11.40

## 2014-08-22 LAB — POCT I-STAT 4, (NA,K, GLUC, HGB,HCT)
Glucose, Bld: 100 mg/dL — ABNORMAL HIGH (ref 65–99)
HCT: 27 % — ABNORMAL LOW (ref 39.0–52.0)
Hemoglobin: 9.2 g/dL — ABNORMAL LOW (ref 13.0–17.0)
Potassium: 3.3 mmol/L — ABNORMAL LOW (ref 3.5–5.1)
Sodium: 142 mmol/L (ref 135–145)

## 2014-08-22 LAB — GLUCOSE, CAPILLARY
Glucose-Capillary: 113 mg/dL — ABNORMAL HIGH (ref 65–99)
Glucose-Capillary: 85 mg/dL (ref 65–99)

## 2014-08-22 SURGERY — TRANSPOSITION, VEIN, BASILIC
Anesthesia: Monitor Anesthesia Care | Site: Arm Upper | Laterality: Right

## 2014-08-22 MED ORDER — HYDROCODONE-ACETAMINOPHEN 5-325 MG PO TABS: 1.0000 | ORAL_TABLET | Freq: Four times a day (QID) | ORAL | Status: DC | PRN

## 2014-08-22 MED ORDER — ONDANSETRON HCL 4 MG/2ML IJ SOLN
INTRAMUSCULAR | Status: DC | PRN
  Administered 2014-08-22: 4 mg via INTRAVENOUS

## 2014-08-22 MED ORDER — MIDAZOLAM HCL 2 MG/2ML IJ SOLN
INTRAMUSCULAR | Status: AC
  Filled 2014-08-22: qty 4

## 2014-08-22 MED ORDER — FENTANYL CITRATE (PF) 250 MCG/5ML IJ SOLN
INTRAMUSCULAR | Status: AC
  Filled 2014-08-22: qty 5

## 2014-08-22 MED ORDER — PROPOFOL INFUSION 10 MG/ML OPTIME
INTRAVENOUS | Status: DC | PRN
  Administered 2014-08-22: 100 ug/kg/min via INTRAVENOUS

## 2014-08-22 MED ORDER — MIDAZOLAM HCL 5 MG/5ML IJ SOLN
INTRAMUSCULAR | Status: DC | PRN
  Administered 2014-08-22: 2 mg via INTRAVENOUS

## 2014-08-22 MED ORDER — PHENYLEPHRINE HCL 10 MG/ML IJ SOLN
INTRAMUSCULAR | Status: DC | PRN
  Administered 2014-08-22 (×6): 40 ug via INTRAVENOUS
  Administered 2014-08-22: 80 ug via INTRAVENOUS
  Administered 2014-08-22: 40 ug via INTRAVENOUS

## 2014-08-22 MED ORDER — 0.9 % SODIUM CHLORIDE (POUR BTL) OPTIME
TOPICAL | Status: DC | PRN
  Administered 2014-08-22: 1000 mL

## 2014-08-22 MED ORDER — PROTAMINE SULFATE 10 MG/ML IV SOLN
INTRAVENOUS | Status: DC | PRN
  Administered 2014-08-22: 40 mg via INTRAVENOUS

## 2014-08-22 MED ORDER — LIDOCAINE HCL (PF) 1 % IJ SOLN
INTRAMUSCULAR | Status: DC | PRN
  Administered 2014-08-22: 30 mL
  Administered 2014-08-22: 22 mL

## 2014-08-22 MED ORDER — HEPARIN SODIUM (PORCINE) 5000 UNIT/ML IJ SOLN
INTRAMUSCULAR | Status: DC | PRN
  Administered 2014-08-22: 10:00:00

## 2014-08-22 MED ORDER — LIDOCAINE HCL (PF) 1 % IJ SOLN
INTRAMUSCULAR | Status: AC
  Filled 2014-08-22: qty 30

## 2014-08-22 MED ORDER — FENTANYL CITRATE (PF) 100 MCG/2ML IJ SOLN
INTRAMUSCULAR | Status: DC | PRN
  Administered 2014-08-22 (×2): 25 ug via INTRAVENOUS
  Administered 2014-08-22: 50 ug via INTRAVENOUS

## 2014-08-22 MED ORDER — PROPOFOL 10 MG/ML IV BOLUS
INTRAVENOUS | Status: AC
  Filled 2014-08-22: qty 20

## 2014-08-22 MED ORDER — SODIUM CHLORIDE 0.9 % IV SOLN
INTRAVENOUS | Status: DC
  Administered 2014-08-22 (×2): via INTRAVENOUS

## 2014-08-22 MED ORDER — PROPOFOL 10 MG/ML IV BOLUS
INTRAVENOUS | Status: DC | PRN
  Administered 2014-08-22 (×2): 20 mg via INTRAVENOUS

## 2014-08-22 MED ORDER — HEPARIN SODIUM (PORCINE) 1000 UNIT/ML IJ SOLN
INTRAMUSCULAR | Status: DC | PRN
  Administered 2014-08-22: 8000 [IU] via INTRAVENOUS

## 2014-08-22 SURGICAL SUPPLY — 37 items
ARMBAND PINK RESTRICT EXTREMIT (MISCELLANEOUS) ×4 IMPLANT
CANISTER SUCTION 2500CC (MISCELLANEOUS) ×4 IMPLANT
CANNULA VESSEL 3MM 2 BLNT TIP (CANNULA) ×4 IMPLANT
CLIP TI MEDIUM 6 (CLIP) ×4 IMPLANT
CLIP TI WIDE RED SMALL 6 (CLIP) ×10 IMPLANT
COVER PROBE W GEL 5X96 (DRAPES) ×3 IMPLANT
DECANTER SPIKE VIAL GLASS SM (MISCELLANEOUS) ×4 IMPLANT
ELECT REM PT RETURN 9FT ADLT (ELECTROSURGICAL) ×4
ELECTRODE REM PT RTRN 9FT ADLT (ELECTROSURGICAL) ×2 IMPLANT
GLOVE BIO SURGEON STRL SZ 6.5 (GLOVE) ×4 IMPLANT
GLOVE BIO SURGEON STRL SZ7.5 (GLOVE) ×4 IMPLANT
GLOVE BIO SURGEONS STRL SZ 6.5 (GLOVE) ×2
GLOVE BIOGEL PI IND STRL 7.0 (GLOVE) ×2 IMPLANT
GLOVE BIOGEL PI IND STRL 8 (GLOVE) ×3 IMPLANT
GLOVE BIOGEL PI INDICATOR 7.0 (GLOVE) ×4
GLOVE BIOGEL PI INDICATOR 8 (GLOVE) ×4
GLOVE ECLIPSE 7.5 STRL STRAW (GLOVE) ×6 IMPLANT
GLOVE SURG SS PI 7.0 STRL IVOR (GLOVE) ×3 IMPLANT
GOWN STRL REUS W/ TWL LRG LVL3 (GOWN DISPOSABLE) ×6 IMPLANT
GOWN STRL REUS W/ TWL XL LVL3 (GOWN DISPOSABLE) ×3 IMPLANT
GOWN STRL REUS W/TWL LRG LVL3 (GOWN DISPOSABLE) ×12
GOWN STRL REUS W/TWL XL LVL3 (GOWN DISPOSABLE) ×12
KIT BASIN OR (CUSTOM PROCEDURE TRAY) ×4 IMPLANT
KIT ROOM TURNOVER OR (KITS) ×4 IMPLANT
LIQUID BAND (GAUZE/BANDAGES/DRESSINGS) ×4 IMPLANT
NS IRRIG 1000ML POUR BTL (IV SOLUTION) ×4 IMPLANT
PACK CV ACCESS (CUSTOM PROCEDURE TRAY) ×4 IMPLANT
PAD ARMBOARD 7.5X6 YLW CONV (MISCELLANEOUS) ×8 IMPLANT
SPONGE LAP 4X18 X RAY DECT (DISPOSABLE) ×3 IMPLANT
SPONGE SURGIFOAM ABS GEL 100 (HEMOSTASIS) IMPLANT
SUT PROLENE 6 0 BV (SUTURE) ×4 IMPLANT
SUT SILK 2 0 SH (SUTURE) ×3 IMPLANT
SUT VIC AB 3-0 SH 27 (SUTURE) ×12
SUT VIC AB 3-0 SH 27X BRD (SUTURE) ×4 IMPLANT
SUT VICRYL 4-0 PS2 18IN ABS (SUTURE) ×7 IMPLANT
UNDERPAD 30X30 INCONTINENT (UNDERPADS AND DIAPERS) ×4 IMPLANT
WATER STERILE IRR 1000ML POUR (IV SOLUTION) ×4 IMPLANT

## 2014-08-22 NOTE — H&P (View-Only) (Signed)
[facility] [place]  Patient name: Anthony Bullock MRN: [id number] DOB: [birth year] Sex: male  REASON FOR CONSULT: Evaluate for hemodialysis access. Referred by Dr. Posey Pronto  HPI: Anthony Bullock is a 54 y.o. male who presents for evaluation for hemodialysis access. He is right-handed. He has a defibrillator on the left side. He is not yet on dialysis. He denies any recent uremic symptoms except fatigue. He denies nausea, vomiting, anorexia, palpitations, or significant shortness of breath.  I have reviewed the records from [facility]. The patient has stage IV chronic kidney disease. His renal function appears to be in gradual decline. GFR on 07/08/2014 was 13  Past Medical History  Diagnosis Date  . Allergic rhinitis   . Diabetes mellitus type II   . Hyperlipidemia   . Hypertension   . Urethral stricture   . Kidney stones   . Myocardial infarction     "stress test showed light heart attack" on 01/16/13  . Chronic systolic heart failure     a. ECHO (12/2012) EF 25-30%, HK entireanteroseptal myocardium b. Cath 01/31/13 RA 6, RV 48/5/7, PA 47/22 (33), PCW 20, Fick CO/CI 5.2/2.5, PVR 2.5  . CHF (congestive heart failure)   . Non-ischemic cardiomyopathy   . Renal insufficiency    Family History  Problem Relation Age of Onset  . Diabetes Other     pt  . Cancer Mother     bladder  . Alcohol abuse Father   . Cancer Father   . Stroke Maternal Grandmother   . Heart Problems Maternal Grandmother     unknown  . Heart disease Maternal Grandfather   . Prostate cancer Maternal Grandfather    SOCIAL HISTORY: History  Substance Use Topics  . Smoking status: Former Smoker -- 2.00 packs/day for 32 years    Types: Cigarettes    Quit date: 05/11/2010  . Smokeless tobacco: Never Used  . Alcohol Use: No   Allergies  Allergen Reactions  . Morphine Sulfate Rash    REACTION: Itches all over, red spots   Current Outpatient Prescriptions  Medication Sig  Dispense Refill  . amLODipine (NORVASC) 2.5 MG tablet Take 1 tablet (2.5 mg total) by mouth daily. 90 tablet 3  . aspirin EC 81 MG tablet Take 162 mg by mouth daily.    [name] atorvastatin (LIPITOR) 80 MG tablet Take 1 tablet (80 mg total) by mouth at bedtime. 90 tablet 1  . azelastine (ASTELIN) 0.1 % nasal spray Place 2 sprays into both nostrils 3 (three) times daily as needed for rhinitis. Use in each nostril as directed 30 mL 12  . carvedilol (COREG) 25 MG tablet Take 1 tablet (25 mg total) by mouth 2 (two) times daily with a meal. 180 tablet 3  . fenofibrate 160 MG tablet TAKE 1 TABLET DAILY 90 tablet 1  . fexofenadine (ALLEGRA) 180 MG tablet Take 180 mg by mouth daily.    . furosemide (LASIX) 40 MG tablet Take 2 tablets in the morning and 1 tablet in the evening. 270 tablet 3  . gabapentin (NEURONTIN) 300 MG capsule Take 1 capsule (300 mg total) by mouth 3 (three) times daily. 270 capsule 3  . glucose blood test strip Check 1 time daily. E11.9 One Touch Ultra Blue Test Strips 100 each 3  . HYDROcodone-acetaminophen (NORCO/VICODIN) 5-325 MG per tablet Take 1 tablet by mouth every 6 (six) hours as needed for moderate pain. 60 tablet 0  . insulin glargine (LANTUS) 100 UNIT/ML injection  Inject 0.5 mLs (50 Units total) into the skin daily. (Patient taking differently: Inject 60 Units into the skin daily. Titrate 2 units every 3 days.) 10 mL 5  . insulin lispro (HUMALOG) 100 UNIT/ML injection Inject 0-14 Units into the skin 3 (three) times daily as needed for high blood sugar (per sliding scale).    . Insulin Pen Needle 32G X 4 MM MISC Check 1 time daily. E11.9 100 each 3  . Olopatadine HCl (PATADAY) 0.2 % SOLN Place 1 drop into both eyes daily as needed (for allergies).     . saxagliptin HCl (ONGLYZA) 5 MG TABS tablet Take 5 mg by mouth daily.    . Vitamin D, Ergocalciferol, (DRISDOL) 50000 UNITS CAPS capsule Take 50,000 Units by mouth every 7 (seven) days.      No current facility-administered  medications for this visit.   REVIEW OF SYSTEMS: Valu.Nieves ] denotes positive finding; [  ] denotes negative finding  CARDIOVASCULAR:  [ ]  chest pain   [ ]  chest pressure   [ ]  palpitations   [ ]  orthopnea   [ ]  dyspnea on exertion   Valu.Nieves ] claudication   Valu.Nieves ] rest pain   [ ]  DVT   [ ]  phlebitis PULMONARY:   [ ]  productive cough   [ ]  asthma   [ ]  wheezing NEUROLOGIC:   [ ]  weakness  [ ]  paresthesias  [ ]  aphasia  [ ]  amaurosis  [ ]  dizziness HEMATOLOGIC:   [ ]  bleeding problems   [ ]  clotting disorders MUSCULOSKELETAL:  [ ]  joint pain   [ ]  joint swelling Valu.Nieves ] leg swelling GASTROINTESTINAL: [ ]   blood in stool  [ ]   hematemesis GENITOURINARY:  [ ]   dysuria  [ ]   hematuria PSYCHIATRIC:  [ ]  history of major depression INTEGUMENTARY:  [ ]  rashes  [ ]  ulcers CONSTITUTIONAL:  [ ]  fever   [ ]  chills  PHYSICAL EXAM: Filed Vitals:   08/13/14 0922 08/13/14 0924  BP: 148/78 126/70  Pulse: 72 66  Temp: 98.2 F (36.8 C)   TempSrc: Oral   Resp: 16   Height: 5\' 11"  (1.803 m)   Weight: 223 lb (101.152 kg)   SpO2: 99%    GENERAL: The patient is a well-nourished male, in no acute distress. The vital signs are documented above. CARDIAC: There is a regular rate and rhythm.  VASCULAR: he has bilateral carotid bruits. He has palpable brachial and radial pulses bilaterally. PULMONARY: There is good air exchange bilaterally without wheezing or rales. ABDOMEN: Soft and non-tender with normal pitched bowel sounds.  MUSCULOSKELETAL: There are no major deformities or cyanosis. NEUROLOGIC: No focal weakness or paresthesias are detected. SKIN: There are no ulcers or rashes noted. PSYCHIATRIC: The patient has a normal affect.  DATA:  I have independently interpreted his vein map which shows that his forearm and upper arm cephalic vein on the right. To be reasonable in size. Likewise his basilic vein looks reasonable in size.  I have independently interpreted his arterial Doppler study shows a triphasic radial  Doppler signal bilaterally and a biphasic ulnar Doppler signal bilaterally.  MEDICAL ISSUES:  STAGE IV CHRONIC KIDNEY DISEASE: Given that he has a defibrillator on the left side I have recommended that we place access in the right arm. He appears to be a reasonable candidate for a radial cephalic or brachial cephalic fistula. I have explained the indications for placement of an AV fistula or AV graft. I've explained that  if at all possible we will place an AV fistula.  I have reviewed the risks of placement of an AV fistula including but not limited to: failure of the fistula to mature, need for subsequent interventions, and thrombosis. In addition I have reviewed the potential complications of placement of an AV graft. These risks include, but are not limited to, graft thrombosis, graft infection, wound healing problems, bleeding, arm swelling, and steal syndrome. All the patient's questions were answered and they are agreeable to proceed with surgery. His surgery is scheduled for 08/22/2014.  BILATERAL CAROTID BRUITS: I will arrange for elective carotid duplex scan to workup his carotid bruits. I have explained to the patient that there is a 30% chance that we would find disease that needs to be followed or addressed.   [name] [facility] [place]: [phone]

## 2014-08-22 NOTE — Anesthesia Postprocedure Evaluation (Signed)
  Anesthesia Post-op Note  Patient: Anthony Bullock  Procedure(s) Performed: Procedure(s): RIGHT UPPER ARM BASCILIC VEIN TRANSPOSITION (Right)  Patient Location: PACU  Anesthesia Type:MAC  Level of Consciousness: awake, alert  and oriented  Airway and Oxygen Therapy: Patient Spontanous Breathing  Post-op Pain: none  Post-op Assessment: Post-op Vital signs reviewed              Post-op Vital Signs: Reviewed and stable  Last Vitals:  Filed Vitals:   08/22/14 1227  BP:   Pulse: 71  Temp:   Resp:     Complications: No apparent anesthesia complications

## 2014-08-22 NOTE — Interval H&P Note (Signed)
History and Physical Interval Note:  08/22/2014 8:55 AM  Anthony Bullock  has presented today for surgery, with the diagnosis of Stage IV Chronic Kidney Disease N18.4  The various methods of treatment have been discussed with the patient and family. After consideration of risks, benefits and other options for treatment, the patient has consented to  Procedure(s): ARTERIOVENOUS (AV) FISTULA CREATION (Right) as a surgical intervention .  The patient's history has been reviewed, patient examined, no change in status, stable for surgery.  I have reviewed the patient's chart and labs.  Questions were answered to the patient's satisfaction.     [name]

## 2014-08-22 NOTE — Discharge Instructions (Signed)
° ° °  08/22/2014 [name] [id number] 05-26-1960  Surgeon(s): Angelia Mould, MD  Procedure(s): RIGHT UPPER ARM BASCILIC VEIN TRANSPOSITION   Do not stick fistula for 12 weeks

## 2014-08-22 NOTE — Anesthesia Preprocedure Evaluation (Addendum)
Anesthesia Evaluation  Patient identified by MRN, date of birth, ID band Patient awake    Reviewed: Allergy & Precautions, NPO status , Patient's Chart, lab work & pertinent test results  Airway Mallampati: II       Dental  (+) Teeth Intact   Pulmonary former smoker,  breath sounds clear to auscultation        Cardiovascular Exercise Tolerance: Poor hypertension, Pt. on medications + CAD, + Past MI and +CHF Rhythm:Regular Rate:Normal + Diastolic murmurs    Neuro/Psych negative psych ROS   GI/Hepatic   Endo/Other  diabetes, Poorly Controlled, Type 2  Renal/GU CRFRenal disease  negative genitourinary   Musculoskeletal  (+) Arthritis -,   Abdominal   Peds  Hematology  (+) anemia ,   Anesthesia Other Findings   Reproductive/Obstetrics                         EKG: normal sinus rhythm.  Echo (2015): - Left ventricle: The cavity size was mildly to moderately dilated. Wall thickness was normal. The estimated ejection fraction was 25%. Diffuse hypokinesis. Doppler parameters are consistent with abnormal left ventricular relaxation (grade 1 diastolic dysfunction). - Aortic valve: There was no stenosis. - Mitral valve: Mildly calcified annulus. Trivial regurgitation. - Left atrium: The atrium was mildly dilated. - Right ventricle: The cavity size was normal. Systolic function was normal. - Pulmonary arteries: No complete TR doppler jet so unable to estimate PA systolic pressure. - Inferior vena cava: The vessel was normal in size; the respirophasic diameter changes were in the normal range (= 50%); findings are consistent with normal central venous pressure. - Pericardium, extracardiac: A trivial pericardial effusion was identified.  Anesthesia Physical Anesthesia Plan  ASA: III  Anesthesia Plan: MAC   Post-op Pain Management:    Induction: Intravenous  Airway  Management Planned: Natural Airway  Additional Equipment:   Intra-op Plan:   Post-operative Plan:   Informed Consent: I have reviewed the patients History and Physical, chart, labs and discussed the procedure including the risks, benefits and alternatives for the proposed anesthesia with the patient or authorized representative who has indicated his/her understanding and acceptance.   Dental advisory given  Plan Discussed with: CRNA  Anesthesia Plan Comments: (Will continue ICD at this time. Will discuss electrocautery with surgeon. Magnet will be placed on patient in the OR )      Anesthesia Quick Evaluation

## 2014-08-22 NOTE — Transfer of Care (Signed)
Immediate Anesthesia Transfer of Care Note  Patient: Anthony Bullock  Procedure(s) Performed: Procedure(s): RIGHT UPPER ARM [place] (Right)  Patient Location: PACU  Anesthesia Type:MAC  Level of Consciousness: awake, alert  and oriented  Airway & Oxygen Therapy: Patient Spontanous Breathing and Patient connected to face mask oxygen  Post-op Assessment: Report given to RN and Post -op Vital signs reviewed and stable  Post vital signs: Reviewed and stable  Last Vitals:  Filed Vitals:   08/22/14 0815  BP: 143/63  Pulse: 72  Temp: 36.1 C  Resp: 20    Complications: No apparent anesthesia complications

## 2014-08-22 NOTE — Op Note (Signed)
    NAME: Anthony Bullock   MRN: [id number] DOB: 06-11-1960    DATE OF OPERATION: 08/22/2014  PREOP DIAGNOSIS: Stage IV chronic kidney disease  POSTOP DIAGNOSIS: Same  PROCEDURE: Left basilic vein transposition  SURGEON: Judeth Cornfield. Scot Dock, MD, FACS  ASSIST: Leontine Locket, PA  ANESTHESIA: local with sedation   EBL: minimal  INDICATIONS: [name] is a 54 y.o. male who is not yet on dialysis. He presents for new access.  FINDINGS: the upper arm and forearm cephalic veins were not adequate size for fistula. The basilic vein was reasonable size.  TECHNIQUE: The patient was taken to the operating room and sedated by anesthesia. The left upper extremity was prepped and draped in the usual sterile fashion. After the skin was anesthetized with 1% lidocaine, a transverse incision was made above the antecubital level. Here the cephalic vein which I thought looked reasonable by ultrasound was identified and was very small and did not think usable for fistula. Therefore the only remaining option for a fistula was a basilic vein transposition. This vein looked reasonable in size. It was about 3-1/2 cm initially and then became larger distally. Using 3 incisions along the medial aspect of the left upper arm, the basilic vein was harvested from the antecubital level to the axilla. Branches were divided between clips and 3-0 silk ties. The vein was then tunneled to the incision to the brachial artery which had been dissected free through the initial incision. The patient was heparinized. The vein had been marked prevent twisting. The brachial artery was clamped proximally and distally and a longitudinal arteriotomy was made. The vein was sewn end-to-side to the artery using continuous 60 proline suture. At the completion was an excellent thrill in the fistula and a palpable radial pulse. The heparin was partially reversed with protamine. The wounds were closed with 2 deep layers of 3-0 Vicryl and the  skin closed with 4-0 Vicryl. The liqui band was applied. Patient tolerated the procedure well and was transferred to the recovery room in stable condition. All needle and sponge counts were correct.  [name], MD, FACS [facility]  DATE OF DICTATION:   08/22/2014

## 2014-08-23 ENCOUNTER — Other Ambulatory Visit: Payer: Self-pay | Admitting: *Deleted

## 2014-08-23 DIAGNOSIS — Z4931 Encounter for adequacy testing for hemodialysis: Secondary | ICD-10-CM

## 2014-08-23 DIAGNOSIS — N186 End stage renal disease: Secondary | ICD-10-CM

## 2014-08-25 ENCOUNTER — Encounter (HOSPITAL_COMMUNITY): Payer: Self-pay | Admitting: Vascular Surgery

## 2014-08-26 ENCOUNTER — Telehealth: Payer: Self-pay | Admitting: Vascular Surgery

## 2014-08-26 NOTE — Telephone Encounter (Signed)
.  Left Message for patient regarding appointment for follow up. Left details of vascular lab appointment as well as office visit with MD. Asked that patient call [phone] if needing to reschedule.

## 2014-08-26 NOTE — Telephone Encounter (Signed)
-----   Message from Mena Goes, RN sent at 08/23/2014  2:33 PM EDT ----- Regarding: Schedule   ----- Message -----    From: Gabriel Earing, PA-C    Sent: 08/22/2014  11:56 AM      To: Vvs Charge Pool  S/p right BVT 08/22/14.  F/u with CSD in 6 weeks with duplex.  Thanks, Aldona Bar

## 2014-08-29 ENCOUNTER — Encounter (HOSPITAL_COMMUNITY)
Admission: RE | Admit: 2014-08-29 | Discharge: 2014-08-29 | Disposition: A | Discharge status: Home / Self Care | Payer: BLUE CROSS/BLUE SHIELD | Source: Ambulatory Visit | Attending: Nephrology | Admitting: Nephrology

## 2014-08-29 DIAGNOSIS — D631 Anemia in chronic kidney disease: Secondary | ICD-10-CM | POA: Diagnosis not present

## 2014-08-29 DIAGNOSIS — N183 Chronic kidney disease, stage 3 (moderate): Secondary | ICD-10-CM | POA: Diagnosis present

## 2014-08-29 DIAGNOSIS — Z5181 Encounter for therapeutic drug level monitoring: Secondary | ICD-10-CM | POA: Diagnosis not present

## 2014-08-29 DIAGNOSIS — Z79899 Other long term (current) drug therapy: Secondary | ICD-10-CM | POA: Diagnosis not present

## 2014-08-29 LAB — POCT HEMOGLOBIN-HEMACUE: Hemoglobin: 8.7 g/dL — ABNORMAL LOW (ref 13.0–17.0)

## 2014-08-29 LAB — IRON AND TIBC
Iron: 45 ug/dL (ref 45–182)
Saturation Ratios: 16 % — ABNORMAL LOW (ref 17.9–39.5)
TIBC: 287 ug/dL (ref 250–450)
UIBC: 242 ug/dL

## 2014-08-29 LAB — FERRITIN: Ferritin: 391 ng/mL — ABNORMAL HIGH (ref 24–336)

## 2014-08-29 MED ORDER — EPOETIN ALFA 10000 UNIT/ML IJ SOLN
INTRAMUSCULAR | Status: AC
  Filled 2014-08-29: qty 1

## 2014-08-29 MED ORDER — EPOETIN ALFA 10000 UNIT/ML IJ SOLN
10000.0000 [IU] | INTRAMUSCULAR | Status: DC
  Administered 2014-08-29: 10000 [IU] via SUBCUTANEOUS

## 2014-09-12 ENCOUNTER — Encounter (HOSPITAL_COMMUNITY): Payer: BLUE CROSS/BLUE SHIELD

## 2014-09-25 ENCOUNTER — Ambulatory Visit: Payer: BLUE CROSS/BLUE SHIELD | Admitting: Family Medicine

## 2014-09-29 ENCOUNTER — Ambulatory Visit (INDEPENDENT_AMBULATORY_CARE_PROVIDER_SITE_OTHER): Payer: BLUE CROSS/BLUE SHIELD | Admitting: Family Medicine

## 2014-09-29 ENCOUNTER — Encounter: Payer: Self-pay | Admitting: Family Medicine

## 2014-09-29 VITALS — BP 150/70 | HR 82 | Temp 98.3°F | Ht 71.0 in | Wt 230.1 lb

## 2014-09-29 DIAGNOSIS — E1342 Other specified diabetes mellitus with diabetic polyneuropathy: Secondary | ICD-10-CM

## 2014-09-29 DIAGNOSIS — Z23 Encounter for immunization: Secondary | ICD-10-CM

## 2014-09-29 DIAGNOSIS — R5383 Other fatigue: Secondary | ICD-10-CM | POA: Diagnosis not present

## 2014-09-29 DIAGNOSIS — I739 Peripheral vascular disease, unspecified: Secondary | ICD-10-CM

## 2014-09-29 DIAGNOSIS — E1165 Type 2 diabetes mellitus with hyperglycemia: Principal | ICD-10-CM

## 2014-09-29 DIAGNOSIS — G629 Polyneuropathy, unspecified: Secondary | ICD-10-CM

## 2014-09-29 DIAGNOSIS — E1129 Type 2 diabetes mellitus with other diabetic kidney complication: Secondary | ICD-10-CM

## 2014-09-29 DIAGNOSIS — E1142 Type 2 diabetes mellitus with diabetic polyneuropathy: Secondary | ICD-10-CM

## 2014-09-29 DIAGNOSIS — I10 Essential (primary) hypertension: Secondary | ICD-10-CM

## 2014-09-29 DIAGNOSIS — R296 Repeated falls: Secondary | ICD-10-CM | POA: Diagnosis not present

## 2014-09-29 DIAGNOSIS — Z9181 History of falling: Secondary | ICD-10-CM

## 2014-09-29 MED ORDER — AMLODIPINE BESYLATE 2.5 MG PO TABS
2.5000 mg | ORAL_TABLET | Freq: Every day | ORAL | Status: DC
Start: 2014-09-29 — End: 2014-12-31

## 2014-09-29 MED ORDER — GABAPENTIN 300 MG PO CAPS: 300.0000 mg | ORAL_CAPSULE | Freq: Three times a day (TID) | ORAL | Status: DC

## 2014-09-29 MED ORDER — FUROSEMIDE 40 MG PO TABS: ORAL_TABLET | ORAL | Status: DC

## 2014-09-29 MED ORDER — CARVEDILOL 25 MG PO TABS: 25.0000 mg | ORAL_TABLET | Freq: Two times a day (BID) | ORAL | Status: DC

## 2014-09-29 MED ORDER — ATORVASTATIN CALCIUM 80 MG PO TABS: 80.0000 mg | ORAL_TABLET | Freq: Every day | ORAL | Status: DC

## 2014-09-29 MED ORDER — FENOFIBRATE 160 MG PO TABS: 160.0000 mg | ORAL_TABLET | Freq: Every day | ORAL | Status: DC

## 2014-09-29 NOTE — Progress Notes (Signed)
Subjective:    Patient ID: Anthony Bullock, male    DOB: 04/12/1960, 54 y.o.   MRN: [id number]  HPI Patient has multiple chronic medical problems including type 2 diabetes with complications of peripheral neuropathy and nephropathy, chronic disease, hypertension, hyperlipidemia, osteoarthritis, CAD, chronic systolic heart failure, COPD, peripheral vascular disease.  He is followed by cardiology and nephrology. He had recent AV fistula right upper extremity. Here today with multiple issues  Frequently feels "off balance". He has fallen a couple times. 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 He has been contemplating looking at long-term disability because of this and multiple other problems above. In addition to his neuropathy with decreased sensation in the feet he also feels he is having some weakness in lower activities as well. He had arterial Dopplers May 2015 with ABI 0.72 on the right and 0.79 on the left.  Peripheral edema and started compression hose recently. They have helped somewhat.  Type 2 diabetes. History of very poor compliance. Last A1c over 12%. Currently on Lantus 75 units once daily and Humalog 14 units 3 times daily with meals. No recent hypoglycemia. Does not monitor blood sugars regularly and brings in no blood sugars for review today  Past Medical History  Diagnosis Date  . Allergic rhinitis   . Diabetes mellitus type II   . Hyperlipidemia   . Hypertension   . Urethral stricture   . Kidney stones   . Myocardial infarction     "stress test showed light heart attack" on 01/16/13  . Chronic systolic heart failure     a. ECHO (12/2012) EF 25-30%, HK entireanteroseptal myocardium b. Cath 01/31/13 RA 6, RV 48/5/7, PA 47/22 (33), PCW 20, Fick CO/CI 5.2/2.5, PVR 2.5  . CHF (congestive heart failure)   . Non-ischemic cardiomyopathy   . Renal insufficiency   . Diabetic neuropathy    Past Surgical History  Procedure Laterality Date  . [place]  urethral surgery for recurrent obstruction  2011  . Total knee arthroplasty  2007  . Left a nd right heart cath  01/30/2013    DR BENSIHMON  . Cardiac defibrillator placement  06/27/2013    Sub Q       BY DR Caryl Comes  . Left and right heart catheterization with coronary angiogram N/A 01/30/2013    Procedure: LEFT AND RIGHT HEART CATHETERIZATION WITH CORONARY ANGIOGRAM;  Surgeon: Jolaine Artist, MD;  Location: [facility] CATH LAB;  Service: Cardiovascular;  Laterality: N/A;  . Implantable cardioverter defibrillator implant N/A 06/27/2013    Procedure: SUB Q ICD;  Surgeon: Deboraha Sprang, MD;  Location: [facility] CATH LAB;  Service: Cardiovascular;  Laterality: N/A;  . Bascilic vein transposition Right 08/22/2014    Procedure: RIGHT UPPER ARM [place];  Surgeon: Angelia Mould, MD;  Location: [facility];  Service: Vascular;  Laterality: Right;    reports that he quit smoking about 4 years ago. His smoking use included Cigarettes. He has a 64 pack-year smoking history. He has never used smokeless tobacco. He reports that he does not drink alcohol or use illicit drugs. family history includes Alcohol abuse in his father; Cancer in his father and mother; Diabetes in his other; Heart Problems in his maternal grandmother; Heart disease in his maternal grandfather; Prostate cancer in his maternal grandfather; Stroke in his maternal grandmother. Allergies  Allergen Reactions  . Morphine Sulfate Rash    REACTION: Itches all over, red spots      Review of  Systems  Constitutional: Positive for fatigue. Negative for fever and chills.  Respiratory: Negative for cough.   Cardiovascular: Positive for leg swelling. Negative for chest pain and palpitations.  Gastrointestinal: Negative for abdominal pain.  Endocrine: Negative for polydipsia and polyuria.  Genitourinary: Negative for dysuria.  Musculoskeletal: Positive for arthralgias.  Neurological: Positive for weakness. Negative for dizziness.        Objective:   Physical Exam  Constitutional: He appears well-developed and well-nourished.  Neck: No JVD present.  Cardiovascular: Normal rate and regular rhythm.   Pulmonary/Chest: Effort normal and breath sounds normal. No respiratory distress. He has no wheezes. He has no rales.  Musculoskeletal: He exhibits edema.  Trace edema legs bilaterally  Neurological: He is alert.  Patient has weakness with plantar flexion bilaterally right greater than left also some weakness with knee extension bilaterally          Assessment & Plan:  #1 type 2 diabetes with complications of neuropathy and nephropathy. Repeat A1c. He has long history of very poor control and very poor compliance. We have mentioned him previously that without home blood sugar readings we cannot easily titrate his insulin #2 high risk for falls. He has neuropathy from his diabetes which I think is largely contributing to this #3 advanced peripheral neuropathy very likely on the basis of poorly controlled diabetes. Will also check B12 and TSH. He is developing some lower extremity weakness suspect related to some motor neuropathy.  Consider neurology referral. #4 history of CAD and chronic systolic heart failure. Continue close follow-up with cardiology #5 chronic kidney disease-severe. Continue close follow-up with nephrology.

## 2014-09-30 LAB — HEMOGLOBIN A1C: Hgb A1c MFr Bld: 15.2 % — ABNORMAL HIGH (ref 4.6–6.5)

## 2014-09-30 LAB — VITAMIN B12: Vitamin B-12: 901 pg/mL (ref 211–911)

## 2014-09-30 LAB — TSH: TSH: 2.41 u[IU]/mL (ref 0.35–4.50)

## 2014-10-01 ENCOUNTER — Telehealth: Payer: Self-pay | Admitting: Family Medicine

## 2014-10-01 NOTE — Telephone Encounter (Signed)
Pt would like to know if you made him the letter to him out of work

## 2014-10-01 NOTE — Telephone Encounter (Signed)
Pt call to say that he need a note stating why he is out of work with the beginning date .

## 2014-10-01 NOTE — Progress Notes (Signed)
Spoke to the pt and gave him the result. Need to ask the doctor about a letter to put him out of work

## 2014-10-02 NOTE — Telephone Encounter (Signed)
Letter printed.

## 2014-10-09 ENCOUNTER — Ambulatory Visit (INDEPENDENT_AMBULATORY_CARE_PROVIDER_SITE_OTHER): Payer: BLUE CROSS/BLUE SHIELD | Admitting: *Deleted

## 2014-10-09 DIAGNOSIS — Z95810 Presence of automatic (implantable) cardiac defibrillator: Secondary | ICD-10-CM

## 2014-10-09 DIAGNOSIS — I5022 Chronic systolic (congestive) heart failure: Secondary | ICD-10-CM | POA: Diagnosis not present

## 2014-10-09 LAB — CUP PACEART INCLINIC DEVICE CHECK
Date Time Interrogation Session: 20160929155711
Pulse Gen Model: 1010
Pulse Gen Serial Number: 18866
Zone Setting Detection Interval: 240 ms
Zone Setting Detection Interval: 272.73 ms

## 2014-10-09 NOTE — Progress Notes (Signed)
Subcutaneous ICD check in clinic. 0 untreated episodes; 0 treated episodes; 0 shocks delivered. Electrode impedance status okay. No programming changes. Remaining longevity to ERI 84%. ROV w/ SK 01/14/15.

## 2014-10-13 ENCOUNTER — Encounter: Payer: Self-pay | Admitting: Vascular Surgery

## 2014-10-15 ENCOUNTER — Encounter (HOSPITAL_COMMUNITY): Payer: BLUE CROSS/BLUE SHIELD

## 2014-10-15 ENCOUNTER — Encounter: Payer: BLUE CROSS/BLUE SHIELD | Admitting: Vascular Surgery

## 2014-10-15 ENCOUNTER — Encounter: Payer: Self-pay | Admitting: Vascular Surgery

## 2014-10-15 ENCOUNTER — Encounter: Payer: Self-pay | Admitting: Family Medicine

## 2014-10-15 ENCOUNTER — Ambulatory Visit (HOSPITAL_COMMUNITY)
Admission: RE | Admit: 2014-10-15 | Discharge: 2014-10-15 | Disposition: A | Discharge status: Home / Self Care | Payer: BLUE CROSS/BLUE SHIELD | Source: Ambulatory Visit | Attending: Vascular Surgery | Admitting: Vascular Surgery

## 2014-10-15 ENCOUNTER — Ambulatory Visit (INDEPENDENT_AMBULATORY_CARE_PROVIDER_SITE_OTHER): Payer: BLUE CROSS/BLUE SHIELD | Admitting: Vascular Surgery

## 2014-10-15 VITALS — BP 151/73 | HR 71 | Temp 98.4°F | Resp 16 | Ht 70.0 in | Wt 231.0 lb

## 2014-10-15 DIAGNOSIS — E785 Hyperlipidemia, unspecified: Secondary | ICD-10-CM | POA: Diagnosis not present

## 2014-10-15 DIAGNOSIS — N186 End stage renal disease: Secondary | ICD-10-CM | POA: Diagnosis not present

## 2014-10-15 DIAGNOSIS — N184 Chronic kidney disease, stage 4 (severe): Secondary | ICD-10-CM

## 2014-10-15 DIAGNOSIS — I10 Essential (primary) hypertension: Secondary | ICD-10-CM | POA: Diagnosis not present

## 2014-10-15 DIAGNOSIS — E1140 Type 2 diabetes mellitus with diabetic neuropathy, unspecified: Secondary | ICD-10-CM | POA: Diagnosis not present

## 2014-10-15 DIAGNOSIS — Z4931 Encounter for adequacy testing for hemodialysis: Secondary | ICD-10-CM

## 2014-10-15 NOTE — Progress Notes (Signed)
Patient name: Anthony Bullock MRN: [id number] DOB: October 10, 1960 Sex: male  REASON FOR VISIT: follow up of RIGHT basilic vein transposition  HPI: Anthony Bullock is a 54 y.o. male who has stage IV chronic kidney disease. He underwent a left basilic vein transposition on 08/22/2014. He comes in for a 6 week follow up visit. He has no specific complaints. He is not yet on dialysis.  Current Outpatient Prescriptions  Medication Sig Dispense Refill  . amLODipine (NORVASC) 2.5 MG tablet Take 1 tablet (2.5 mg total) by mouth daily. 90 tablet 3  . aspirin EC 81 MG tablet Take 162 mg by mouth daily.    [name] atorvastatin (LIPITOR) 80 MG tablet Take 1 tablet (80 mg total) by mouth at bedtime. 90 tablet 3  . azelastine (ASTELIN) 0.1 % nasal spray Place 2 sprays into both nostrils 3 (three) times daily as needed for rhinitis. Use in each nostril as directed 30 mL 12  . carvedilol (COREG) 25 MG tablet Take 1 tablet (25 mg total) by mouth 2 (two) times daily with a meal. 180 tablet 3  . fenofibrate 160 MG tablet Take 1 tablet (160 mg total) by mouth daily. 90 tablet 3  . fexofenadine (ALLEGRA) 180 MG tablet Take 180 mg by mouth daily.    . furosemide (LASIX) 40 MG tablet Take 2 tablets in the morning. 180 tablet 3  . gabapentin (NEURONTIN) 300 MG capsule Take 1 capsule (300 mg total) by mouth 3 (three) times daily. 270 capsule 3  . glucose blood test strip Check 1 time daily. E11.9 One Touch Ultra Blue Test Strips 100 each 3  . HYDROcodone-acetaminophen (NORCO/VICODIN) 5-325 MG per tablet Take 1 tablet by mouth every 6 (six) hours as needed for moderate pain. 20 tablet 0  . insulin glargine (LANTUS) 100 UNIT/ML injection Inject 0.5 mLs (50 Units total) into the skin daily. (Patient taking differently: Inject 60 Units into the skin daily. Titrate 2 units every 3 days.) 10 mL 5  . insulin lispro (HUMALOG) 100 UNIT/ML injection Inject 0-14 Units into the skin 3 (three) times daily as needed for high blood sugar (per  sliding scale).    . Insulin Pen Needle 32G X 4 MM MISC Check 1 time daily. E11.9 100 each 3  . Olopatadine HCl (PATADAY) 0.2 % SOLN Place 1 drop into both eyes daily as needed (for allergies).     . Vitamin D, Ergocalciferol, (DRISDOL) 50000 UNITS CAPS capsule Take 50,000 Units by mouth every 7 (seven) days.      No current facility-administered medications for this visit.   REVIEW OF SYSTEMS: Valu.Nieves ] denotes positive finding; [  ] denotes negative finding  CARDIOVASCULAR:  [ ]  chest pain   [ ]  dyspnea on exertion    CONSTITUTIONAL:  [ ]  fever   [ ]  chills  PHYSICAL EXAM: Filed Vitals:   10/15/14 1544 10/15/14 1549  BP: 166/78 151/73  Pulse: 77 71  Temp: 98.4 F (36.9 C)   TempSrc: Oral   Resp: 16   Height: 5\' 10"  (1.778 m)   Weight: 231 lb (104.781 kg)   SpO2: 99%    GENERAL: The patient is a well-nourished male, in no acute distress. The vital signs are documented above. CARDIOVASCULAR: There is a regular rate and rhythm. PULMONARY: There is good air exchange bilaterally without wheezing or rales. His right basilic vein transposition has an excellent bruit and thrill. He has a palpable right radial pulse.  DUPLEX RIGHT AV FISTULA: His  duplex of his fistula shows that the vein is gradually dilating with diameters ranging from 0.50-0.67 cm.  MEDICAL ISSUES:  STAGE IV CHRONIC KIDNEY DISEASE: His right basilic vein transposition appears to be maturing adequately. Hopefully he will not need dialysis for some time. I think the fistula will be ready for access if and when it is needed. I'll see him back as needed.  [name] [facility] [place]: [phone]

## 2014-10-15 NOTE — Progress Notes (Signed)
Filed Vitals:   10/15/14 1544 10/15/14 1549  BP: 166/78 151/73  Pulse: 77 71  Temp: 98.4 F (36.9 C)   TempSrc: Oral   Resp: 16   Height: 5\' 10"  (1.778 m)   Weight: 231 lb (104.781 kg)   SpO2: 99%

## 2014-10-24 ENCOUNTER — Encounter (HOSPITAL_COMMUNITY): Payer: BLUE CROSS/BLUE SHIELD

## 2014-10-28 ENCOUNTER — Other Ambulatory Visit (HOSPITAL_COMMUNITY): Payer: Self-pay | Admitting: *Deleted

## 2014-10-29 ENCOUNTER — Encounter (HOSPITAL_COMMUNITY)
Admission: RE | Admit: 2014-10-29 | Discharge: 2014-10-29 | Disposition: A | Discharge status: Home / Self Care | Payer: BLUE CROSS/BLUE SHIELD | Source: Ambulatory Visit | Attending: Nephrology | Admitting: Nephrology

## 2014-10-29 DIAGNOSIS — D631 Anemia in chronic kidney disease: Secondary | ICD-10-CM | POA: Diagnosis not present

## 2014-10-29 DIAGNOSIS — Z5181 Encounter for therapeutic drug level monitoring: Secondary | ICD-10-CM | POA: Insufficient documentation

## 2014-10-29 DIAGNOSIS — Z79899 Other long term (current) drug therapy: Secondary | ICD-10-CM | POA: Insufficient documentation

## 2014-10-29 DIAGNOSIS — N183 Chronic kidney disease, stage 3 (moderate): Secondary | ICD-10-CM | POA: Insufficient documentation

## 2014-10-29 DIAGNOSIS — D509 Iron deficiency anemia, unspecified: Secondary | ICD-10-CM | POA: Diagnosis not present

## 2014-10-29 LAB — POCT HEMOGLOBIN-HEMACUE: Hemoglobin: 7.9 g/dL — ABNORMAL LOW (ref 13.0–17.0)

## 2014-10-29 LAB — IRON AND TIBC
Iron: 82 ug/dL (ref 45–182)
Saturation Ratios: 26 % (ref 17.9–39.5)
TIBC: 321 ug/dL (ref 250–450)
UIBC: 239 ug/dL

## 2014-10-29 LAB — FERRITIN: Ferritin: 306 ng/mL (ref 24–336)

## 2014-10-29 MED ORDER — DARBEPOETIN ALFA 100 MCG/0.5ML IJ SOSY
100.0000 ug | PREFILLED_SYRINGE | INTRAMUSCULAR | Status: DC
  Administered 2014-10-29: 100 ug via SUBCUTANEOUS

## 2014-10-29 MED ORDER — DARBEPOETIN ALFA 100 MCG/0.5ML IJ SOSY
PREFILLED_SYRINGE | INTRAMUSCULAR | Status: AC
  Filled 2014-10-29: qty 0.5

## 2014-10-31 ENCOUNTER — Encounter: Payer: Self-pay | Admitting: Internal Medicine

## 2014-11-04 ENCOUNTER — Ambulatory Visit (HOSPITAL_COMMUNITY)
Admission: RE | Admit: 2014-11-04 | Discharge: 2014-11-04 | Disposition: A | Discharge status: Home / Self Care | Payer: BLUE CROSS/BLUE SHIELD | Source: Ambulatory Visit | Attending: Nephrology | Admitting: Nephrology

## 2014-11-04 DIAGNOSIS — Z5181 Encounter for therapeutic drug level monitoring: Secondary | ICD-10-CM | POA: Diagnosis not present

## 2014-11-04 DIAGNOSIS — Z79899 Other long term (current) drug therapy: Secondary | ICD-10-CM | POA: Insufficient documentation

## 2014-11-04 DIAGNOSIS — D631 Anemia in chronic kidney disease: Secondary | ICD-10-CM | POA: Diagnosis not present

## 2014-11-04 DIAGNOSIS — N183 Chronic kidney disease, stage 3 (moderate): Secondary | ICD-10-CM | POA: Diagnosis present

## 2014-11-04 LAB — RENAL FUNCTION PANEL
Albumin: 2.2 g/dL — ABNORMAL LOW (ref 3.5–5.0)
Anion gap: 9 (ref 5–15)
BUN: 51 mg/dL — ABNORMAL HIGH (ref 6–20)
CO2: 26 mmol/L (ref 22–32)
Calcium: 8.1 mg/dL — ABNORMAL LOW (ref 8.9–10.3)
Chloride: 94 mmol/L — ABNORMAL LOW (ref 101–111)
Creatinine, Ser: 5.25 mg/dL — ABNORMAL HIGH (ref 0.61–1.24)
GFR calc Af Amer: 13 mL/min — ABNORMAL LOW (ref >60)
GFR calc non Af Amer: 11 mL/min — ABNORMAL LOW (ref >60)
Glucose, Bld: 858 mg/dL (ref 65–99)
Phosphorus: 5.7 mg/dL — ABNORMAL HIGH (ref 2.5–4.6)
Potassium: 3.5 mmol/L (ref 3.5–5.1)
Sodium: 129 mmol/L — ABNORMAL LOW (ref 135–145)

## 2014-11-04 LAB — POCT HEMOGLOBIN-HEMACUE: Hemoglobin: 8.1 g/dL — ABNORMAL LOW (ref 13.0–17.0)

## 2014-11-04 LAB — MAGNESIUM: Magnesium: 2.1 mg/dL (ref 1.7–2.4)

## 2014-11-04 MED ORDER — DARBEPOETIN ALFA 100 MCG/0.5ML IJ SOSY
100.0000 ug | PREFILLED_SYRINGE | INTRAMUSCULAR | Status: DC
  Administered 2014-11-04: 100 ug via SUBCUTANEOUS

## 2014-11-04 MED ORDER — DARBEPOETIN ALFA 100 MCG/0.5ML IJ SOSY
PREFILLED_SYRINGE | INTRAMUSCULAR | Status: AC
  Filled 2014-11-04: qty 0.5

## 2014-11-06 LAB — HEPATITIS B SURFACE ANTIGEN: Hepatitis B Surface Ag: NEGATIVE

## 2014-11-13 ENCOUNTER — Encounter (HOSPITAL_COMMUNITY)
Admission: RE | Admit: 2014-11-13 | Discharge: 2014-11-13 | Disposition: A | Discharge status: Home / Self Care | Payer: BLUE CROSS/BLUE SHIELD | Source: Ambulatory Visit | Attending: Nephrology | Admitting: Nephrology

## 2014-11-13 DIAGNOSIS — Z79899 Other long term (current) drug therapy: Secondary | ICD-10-CM | POA: Diagnosis not present

## 2014-11-13 DIAGNOSIS — D509 Iron deficiency anemia, unspecified: Secondary | ICD-10-CM | POA: Diagnosis not present

## 2014-11-13 DIAGNOSIS — D631 Anemia in chronic kidney disease: Secondary | ICD-10-CM | POA: Diagnosis not present

## 2014-11-13 DIAGNOSIS — N183 Chronic kidney disease, stage 3 (moderate): Secondary | ICD-10-CM | POA: Diagnosis not present

## 2014-11-13 DIAGNOSIS — Z5181 Encounter for therapeutic drug level monitoring: Secondary | ICD-10-CM | POA: Diagnosis not present

## 2014-11-13 LAB — POCT HEMOGLOBIN-HEMACUE: Hemoglobin: 9.1 g/dL — ABNORMAL LOW (ref 13.0–17.0)

## 2014-11-13 MED ORDER — DARBEPOETIN ALFA 100 MCG/0.5ML IJ SOSY
100.0000 ug | PREFILLED_SYRINGE | INTRAMUSCULAR | Status: DC
  Administered 2014-11-13: 100 ug via SUBCUTANEOUS

## 2014-11-13 MED ORDER — DARBEPOETIN ALFA 100 MCG/0.5ML IJ SOSY
PREFILLED_SYRINGE | INTRAMUSCULAR | Status: AC
  Administered 2014-11-13: 100 ug via SUBCUTANEOUS
  Filled 2014-11-13: qty 0.5

## 2014-11-20 ENCOUNTER — Encounter (HOSPITAL_COMMUNITY)
Admission: RE | Admit: 2014-11-20 | Discharge: 2014-11-20 | Disposition: A | Discharge status: Home / Self Care | Payer: BLUE CROSS/BLUE SHIELD | Source: Ambulatory Visit | Attending: Nephrology | Admitting: Nephrology

## 2014-11-20 ENCOUNTER — Telehealth: Payer: Self-pay | Admitting: Family Medicine

## 2014-11-20 DIAGNOSIS — N183 Chronic kidney disease, stage 3 (moderate): Secondary | ICD-10-CM | POA: Diagnosis not present

## 2014-11-20 DIAGNOSIS — N184 Chronic kidney disease, stage 4 (severe): Secondary | ICD-10-CM

## 2014-11-20 DIAGNOSIS — I5022 Chronic systolic (congestive) heart failure: Secondary | ICD-10-CM

## 2014-11-20 LAB — POCT HEMOGLOBIN-HEMACUE: Hemoglobin: 8.7 g/dL — ABNORMAL LOW (ref 13.0–17.0)

## 2014-11-20 MED ORDER — DARBEPOETIN ALFA 100 MCG/0.5ML IJ SOSY
100.0000 ug | PREFILLED_SYRINGE | INTRAMUSCULAR | Status: DC
  Administered 2014-11-20: 100 ug via SUBCUTANEOUS

## 2014-11-20 MED ORDER — DARBEPOETIN ALFA 100 MCG/0.5ML IJ SOSY
PREFILLED_SYRINGE | INTRAMUSCULAR | Status: AC
  Filled 2014-11-20: qty 0.5

## 2014-11-20 NOTE — Telephone Encounter (Signed)
Anthony Bullock was wanting to know if they can get a prescription for a walker with wheels.

## 2014-11-20 NOTE — Telephone Encounter (Signed)
Last time pt was seen was 09/29/2014. Please advise.

## 2014-11-20 NOTE — Telephone Encounter (Signed)
Yes OK to order 

## 2014-11-21 NOTE — Telephone Encounter (Signed)
Order placed

## 2014-11-28 ENCOUNTER — Encounter (HOSPITAL_COMMUNITY)
Admission: RE | Admit: 2014-11-28 | Discharge: 2014-11-28 | Disposition: A | Discharge status: Home / Self Care | Payer: BLUE CROSS/BLUE SHIELD | Source: Ambulatory Visit | Attending: Nephrology | Admitting: Nephrology

## 2014-11-28 DIAGNOSIS — N183 Chronic kidney disease, stage 3 (moderate): Secondary | ICD-10-CM | POA: Diagnosis not present

## 2014-11-28 LAB — FERRITIN: Ferritin: 110 ng/mL (ref 24–336)

## 2014-11-28 LAB — IRON AND TIBC
Iron: 32 ug/dL — ABNORMAL LOW (ref 45–182)
Saturation Ratios: 9 % — ABNORMAL LOW (ref 17.9–39.5)
TIBC: 340 ug/dL (ref 250–450)
UIBC: 308 ug/dL

## 2014-11-28 MED ORDER — DARBEPOETIN ALFA 100 MCG/0.5ML IJ SOSY: 100.0000 ug | PREFILLED_SYRINGE | INTRAMUSCULAR | Status: DC

## 2014-11-28 MED ORDER — DARBEPOETIN ALFA 100 MCG/0.5ML IJ SOSY
PREFILLED_SYRINGE | INTRAMUSCULAR | Status: AC
  Administered 2014-11-28: 100 ug via SUBCUTANEOUS
  Filled 2014-11-28: qty 0.5

## 2014-12-01 LAB — POCT HEMOGLOBIN-HEMACUE: Hemoglobin: 9 g/dL — ABNORMAL LOW (ref 13.0–17.0)

## 2014-12-03 ENCOUNTER — Encounter (HOSPITAL_COMMUNITY)
Admission: RE | Admit: 2014-12-03 | Discharge: 2014-12-03 | Disposition: A | Discharge status: Home / Self Care | Payer: BLUE CROSS/BLUE SHIELD | Source: Ambulatory Visit | Attending: Nephrology | Admitting: Nephrology

## 2014-12-03 DIAGNOSIS — N183 Chronic kidney disease, stage 3 (moderate): Secondary | ICD-10-CM | POA: Diagnosis not present

## 2014-12-03 LAB — MAGNESIUM: Magnesium: 2.1 mg/dL (ref 1.7–2.4)

## 2014-12-03 LAB — RENAL FUNCTION PANEL
Albumin: 1.9 g/dL — ABNORMAL LOW (ref 3.5–5.0)
Anion gap: 8 (ref 5–15)
BUN: 46 mg/dL — ABNORMAL HIGH (ref 6–20)
CO2: 22 mmol/L (ref 22–32)
Calcium: 7.8 mg/dL — ABNORMAL LOW (ref 8.9–10.3)
Chloride: 109 mmol/L (ref 101–111)
Creatinine, Ser: 5.77 mg/dL — ABNORMAL HIGH (ref 0.61–1.24)
GFR calc Af Amer: 12 mL/min — ABNORMAL LOW (ref >60)
GFR calc non Af Amer: 10 mL/min — ABNORMAL LOW (ref >60)
Glucose, Bld: 261 mg/dL — ABNORMAL HIGH (ref 65–99)
Phosphorus: 6.6 mg/dL — ABNORMAL HIGH (ref 2.5–4.6)
Potassium: 3.7 mmol/L (ref 3.5–5.1)
Sodium: 139 mmol/L (ref 135–145)

## 2014-12-03 MED ORDER — DARBEPOETIN ALFA 100 MCG/0.5ML IJ SOSY
100.0000 ug | PREFILLED_SYRINGE | INTRAMUSCULAR | Status: DC
  Administered 2014-12-03: 100 ug via SUBCUTANEOUS

## 2014-12-03 MED ORDER — DARBEPOETIN ALFA 100 MCG/0.5ML IJ SOSY
PREFILLED_SYRINGE | INTRAMUSCULAR | Status: AC
  Filled 2014-12-03: qty 0.5

## 2014-12-05 LAB — POCT HEMOGLOBIN-HEMACUE: Hemoglobin: 9.5 g/dL — ABNORMAL LOW (ref 13.0–17.0)

## 2014-12-10 ENCOUNTER — Other Ambulatory Visit: Payer: Self-pay | Admitting: Family Medicine

## 2014-12-11 ENCOUNTER — Encounter (HOSPITAL_COMMUNITY)
Admission: RE | Admit: 2014-12-11 | Discharge: 2014-12-11 | Disposition: A | Discharge status: Home / Self Care | Payer: BLUE CROSS/BLUE SHIELD | Source: Ambulatory Visit | Attending: Nephrology | Admitting: Nephrology

## 2014-12-11 DIAGNOSIS — Z5181 Encounter for therapeutic drug level monitoring: Secondary | ICD-10-CM | POA: Diagnosis not present

## 2014-12-11 DIAGNOSIS — N183 Chronic kidney disease, stage 3 (moderate): Secondary | ICD-10-CM | POA: Diagnosis not present

## 2014-12-11 DIAGNOSIS — Z79899 Other long term (current) drug therapy: Secondary | ICD-10-CM | POA: Diagnosis not present

## 2014-12-11 DIAGNOSIS — D631 Anemia in chronic kidney disease: Secondary | ICD-10-CM | POA: Insufficient documentation

## 2014-12-11 DIAGNOSIS — D509 Iron deficiency anemia, unspecified: Secondary | ICD-10-CM | POA: Insufficient documentation

## 2014-12-11 LAB — POCT HEMOGLOBIN-HEMACUE: Hemoglobin: 10.1 g/dL — ABNORMAL LOW (ref 13.0–17.0)

## 2014-12-11 MED ORDER — DARBEPOETIN ALFA 100 MCG/0.5ML IJ SOSY
100.0000 ug | PREFILLED_SYRINGE | INTRAMUSCULAR | Status: DC
  Administered 2014-12-11: 100 ug via SUBCUTANEOUS

## 2014-12-11 MED ORDER — DARBEPOETIN ALFA 100 MCG/0.5ML IJ SOSY
PREFILLED_SYRINGE | INTRAMUSCULAR | Status: AC
  Filled 2014-12-11: qty 0.5

## 2014-12-18 ENCOUNTER — Encounter (HOSPITAL_COMMUNITY)
Admission: RE | Admit: 2014-12-18 | Discharge: 2014-12-18 | Disposition: A | Discharge status: Home / Self Care | Payer: BLUE CROSS/BLUE SHIELD | Source: Ambulatory Visit | Attending: Nephrology | Admitting: Nephrology

## 2014-12-18 DIAGNOSIS — N183 Chronic kidney disease, stage 3 (moderate): Secondary | ICD-10-CM | POA: Diagnosis not present

## 2014-12-18 LAB — POCT HEMOGLOBIN-HEMACUE: Hemoglobin: 10.9 g/dL — ABNORMAL LOW (ref 13.0–17.0)

## 2014-12-18 MED ORDER — DARBEPOETIN ALFA 100 MCG/0.5ML IJ SOSY
100.0000 ug | PREFILLED_SYRINGE | INTRAMUSCULAR | Status: DC
  Administered 2014-12-18: 100 ug via SUBCUTANEOUS

## 2014-12-18 MED ORDER — DARBEPOETIN ALFA 100 MCG/0.5ML IJ SOSY
PREFILLED_SYRINGE | INTRAMUSCULAR | Status: AC
  Administered 2014-12-18: 100 ug via SUBCUTANEOUS
  Filled 2014-12-18: qty 0.5

## 2014-12-23 ENCOUNTER — Telehealth: Payer: Self-pay | Admitting: Family Medicine

## 2014-12-23 NOTE — Telephone Encounter (Signed)
VERY likely permanent- he has end stage renal failure, severe systolic heart failure, and severe peripheral neuropathy with foot drop and tremendous difficulties ambulating.  His renal failure and heart failure will be permanent.

## 2014-12-23 NOTE — Telephone Encounter (Signed)
[place] call from [place] to verify the following information. She said Dr Elease Hashimoto fill out some disability paperwork  On this pt and she is trying to verify that indefinite means that his condition is permanent . Would like a call back   [phone]  Ext [phone]  Ref number [id number]

## 2014-12-23 NOTE — Telephone Encounter (Signed)
Please confirm patient's status

## 2014-12-24 ENCOUNTER — Encounter: Payer: Self-pay | Admitting: Vascular Surgery

## 2014-12-24 ENCOUNTER — Ambulatory Visit (INDEPENDENT_AMBULATORY_CARE_PROVIDER_SITE_OTHER): Payer: BLUE CROSS/BLUE SHIELD | Admitting: Vascular Surgery

## 2014-12-24 VITALS — BP 162/78 | HR 79 | Temp 98.2°F | Ht 70.0 in | Wt 247.0 lb

## 2014-12-24 DIAGNOSIS — N184 Chronic kidney disease, stage 4 (severe): Secondary | ICD-10-CM

## 2014-12-24 NOTE — Telephone Encounter (Signed)
Representative at [employer] is aware of Dr. Erick Blinks comments

## 2014-12-24 NOTE — Progress Notes (Signed)
History of Present Illness:  Patient is a 54 y.o. year old male who presents for evaluation of right arm swelling.  He had right basilic vein transposition 08/2014 and has had increased swelling in the fore arm since then.  He was last here 10/15/2014 the duplex showed good maturation and depth.  He now is having shoulder and elbow pain from immobility.    Other medical problems include has Poorly controlled type II diabetes mellitus with renal complication ([place]); Hyperlipidemia; ADJUSTMENT DISORDER WITH DEPRESSED MOOD; Essential hypertension; ALLERGIC RHINITIS; PYELITIS/PYELONEPHRITIS DISEASES CLASSIFIED ELSW; URINARY CALCULUS; ANEMIA, MILD; Chronic kidney disease (CKD), stage IV (severe) ([place]); Diabetic peripheral neuropathy (HCC); COPD (chronic obstructive pulmonary disease) ([place]); Acute systolic heart failure ([place]); Abnormal nuclear stress test; Chronic systolic congestive heart failure ([place]); Chronic systolic heart failure ([place]); CAD (coronary artery disease); Other primary cardiomyopathies; Osteoarthritis of multiple joints; Osteoarthritis of both knees; At high risk for falls; and Peripheral vascular disease ([place]) on his problem list.  Past Medical History  Diagnosis Date  . Allergic rhinitis   . Diabetes mellitus type II   . Hyperlipidemia   . Hypertension   . Urethral stricture   . Kidney stones   . Myocardial infarction [facility])     "stress test showed light heart attack" on 01/16/13  . Chronic systolic heart failure ([place])     a. ECHO (12/2012) EF 25-30%, HK entireanteroseptal myocardium b. Cath 01/31/13 RA 6, RV 48/5/7, PA 47/22 (33), PCW 20, Fick CO/CI 5.2/2.5, PVR 2.5  . CHF (congestive heart failure) ([place])   . Non-ischemic cardiomyopathy ([place])   . Renal insufficiency   . Diabetic neuropathy [facility])     Past Surgical History  Procedure Laterality Date  . Reapea urethral surgery for recurrent obstruction  2011  . Total knee arthroplasty  2007  . Left a nd right heart cath  01/30/2013     DR BENSIHMON  . Cardiac defibrillator placement  06/27/2013    Sub Q       BY DR Caryl Comes  . Left and right heart catheterization with coronary angiogram N/A 01/30/2013    Procedure: LEFT AND RIGHT HEART CATHETERIZATION WITH CORONARY ANGIOGRAM;  Surgeon: Jolaine Artist, MD;  Location: [facility] CATH LAB;  Service: Cardiovascular;  Laterality: N/A;  . Implantable cardioverter defibrillator implant N/A 06/27/2013    Procedure: SUB Q ICD;  Surgeon: Deboraha Sprang, MD;  Location: [facility] CATH LAB;  Service: Cardiovascular;  Laterality: N/A;  . Bascilic vein transposition Right 08/22/2014    Procedure: RIGHT UPPER ARM [place];  Surgeon: Angelia Mould, MD;  Location: [facility] OR;  Service: Vascular;  Laterality: Right;    Social History Social History  Substance Use Topics  . Smoking status: Former Smoker -- 2.00 packs/day for 32 years    Types: Cigarettes    Quit date: 05/11/2010  . Smokeless tobacco: Never Used  . Alcohol Use: No    Family History Family History  Problem Relation Age of Onset  . Diabetes Other     pt  . Cancer Mother     bladder  . Alcohol abuse Father   . Cancer Father   . Stroke Maternal Grandmother   . Heart Problems Maternal Grandmother     unknown  . Heart disease Maternal Grandfather   . Prostate cancer Maternal Grandfather     Allergies  Allergies  Allergen Reactions  . Morphine Sulfate Rash    REACTION: Itches all over, red spots  Current Outpatient Prescriptions  Medication Sig Dispense Refill  . amLODipine (NORVASC) 2.5 MG tablet Take 1 tablet (2.5 mg total) by mouth daily. 90 tablet 3  . aspirin EC 81 MG tablet Take 162 mg by mouth daily.    [name] atorvastatin (LIPITOR) 80 MG tablet Take 1 tablet (80 mg total) by mouth at bedtime. 90 tablet 3  . azelastine (ASTELIN) 0.1 % nasal spray Place 2 sprays into both nostrils 3 (three) times daily as needed for rhinitis. Use in each nostril as directed 30 mL 12  . carvedilol (COREG) 25 MG  tablet Take 1 tablet (25 mg total) by mouth 2 (two) times daily with a meal. 180 tablet 3  . fenofibrate 160 MG tablet Take 1 tablet (160 mg total) by mouth daily. 90 tablet 3  . fexofenadine (ALLEGRA) 180 MG tablet Take 180 mg by mouth daily.    . furosemide (LASIX) 40 MG tablet Take 2 tablets in the morning. 180 tablet 3  . gabapentin (NEURONTIN) 300 MG capsule Take 1 capsule (300 mg total) by mouth 3 (three) times daily. 270 capsule 3  . glucose blood test strip Check 1 time daily. E11.9 One Touch Ultra Blue Test Strips 100 each 3  . HYDROcodone-acetaminophen (NORCO/VICODIN) 5-325 MG per tablet Take 1 tablet by mouth every 6 (six) hours as needed for moderate pain. 20 tablet 0  . insulin lispro (HUMALOG) 100 UNIT/ML injection Inject 0-14 Units into the skin 3 (three) times daily as needed for high blood sugar (per sliding scale).    . Insulin Pen Needle 32G X 4 MM MISC Check 1 time daily. E11.9 100 each 3  . LANTUS SOLOSTAR 100 UNIT/ML Solostar Pen INJECT 50 UNITS UNDER THE SKIN DAILY 45 mL 4  . Olopatadine HCl (PATADAY) 0.2 % SOLN Place 1 drop into both eyes daily as needed (for allergies).     . Vitamin D, Ergocalciferol, (DRISDOL) 50000 UNITS CAPS capsule Take 50,000 Units by mouth every 7 (seven) days.      No current facility-administered medications for this visit.    ROS:   General:  No weight loss, Fever, chills  HEENT: No recent headaches, no nasal bleeding, no visual changes, no sore throat  Neurologic: No dizziness, blackouts, seizures. No recent symptoms of stroke or mini- stroke. No recent episodes of slurred speech, or temporary blindness.  Cardiac: No recent episodes of chest pain/pressure, no shortness of breath at rest.  No shortness of breath with exertion.  Denies history of atrial fibrillation or irregular heartbeat  Vascular: No history of rest pain in feet.  No history of claudication.  No history of non-healing ulcer, No history of DVT   Pulmonary: No home  oxygen, no productive cough, no hemoptysis,  No asthma or wheezing  Musculoskeletal:  [ ]  Arthritis, [ ]  Low back pain,  [ ]  Joint pain  Hematologic:No history of hypercoagulable state.  No history of easy bleeding.  No history of anemia  Gastrointestinal: No hematochezia or melena,  No gastroesophageal reflux, no trouble swallowing  Urinary: [ ]  chronic Kidney disease, [ ]  on HD - [ ]  MWF or [ ]  TTHS, [ ]  Burning with urination, [ ]  Frequent urination, [ ]  Difficulty urinating;   Skin: No rashes  Psychological: No history of anxiety,  No history of depression   Physical Examination  Filed Vitals:   12/24/14 1509  BP: 162/78  Pulse: 79  Temp: 98.2 F (36.8 C)  TempSrc: Oral  Height: 5\' 10"  (1.778 m)  Weight: 247 lb (112.038 kg)  SpO2: 97%    Body mass index is 35.44 kg/(m^2).  General:  Alert and oriented, no acute distress HEENT: Normal Neck: No bruit or JVD Pulmonary: Clear to auscultation bilaterally Cardiac: Regular Rate and Rhythm without murmur Abdomen: Soft, non-tender, non-distended, no mass, no scars Skin: No rash Extremity Pulses:  2+ radial, brachial, right fistula has palpable thrill Neurologic: left Upper motor 5/5, right painful passive motion of elbow and shoulder     ASSESSMENT:  Immobility verses venous out flow stenosis right UE.  S/P basilic vein transposition.   PLAN:  Elevation demonstration in the office today.  Ace wrap was placed from the hand to the elbow.  His wife has a sling at home for him to use as well. We will set him up for a fistulogram to r/o venous stenosis Jan 9th with Dr. Scot Dock.  Theda Sers, Alarik Radu MAUREEN PA-C [facility] The patient was seen in conjunction with Dr. Scot Dock today.

## 2014-12-25 ENCOUNTER — Encounter (HOSPITAL_COMMUNITY)
Admission: RE | Admit: 2014-12-25 | Discharge: 2014-12-25 | Disposition: A | Discharge status: Home / Self Care | Payer: BLUE CROSS/BLUE SHIELD | Source: Ambulatory Visit | Attending: Nephrology | Admitting: Nephrology

## 2014-12-25 DIAGNOSIS — N183 Chronic kidney disease, stage 3 (moderate): Secondary | ICD-10-CM | POA: Diagnosis not present

## 2014-12-25 LAB — IRON AND TIBC
Iron: 34 ug/dL — ABNORMAL LOW (ref 45–182)
Saturation Ratios: 10 % — ABNORMAL LOW (ref 17.9–39.5)
TIBC: 346 ug/dL (ref 250–450)
UIBC: 312 ug/dL

## 2014-12-25 LAB — FERRITIN: Ferritin: 62 ng/mL (ref 24–336)

## 2014-12-25 LAB — POCT HEMOGLOBIN-HEMACUE: Hemoglobin: 10.7 g/dL — ABNORMAL LOW (ref 13.0–17.0)

## 2014-12-25 MED ORDER — DARBEPOETIN ALFA 100 MCG/0.5ML IJ SOSY
100.0000 ug | PREFILLED_SYRINGE | INTRAMUSCULAR | Status: DC
  Administered 2014-12-25: 100 ug via SUBCUTANEOUS

## 2014-12-25 MED ORDER — DARBEPOETIN ALFA 100 MCG/0.5ML IJ SOSY
PREFILLED_SYRINGE | INTRAMUSCULAR | Status: AC
  Filled 2014-12-25: qty 0.5

## 2014-12-29 ENCOUNTER — Other Ambulatory Visit: Payer: Self-pay

## 2014-12-31 ENCOUNTER — Encounter: Payer: Self-pay | Admitting: Family Medicine

## 2014-12-31 ENCOUNTER — Other Ambulatory Visit (HOSPITAL_COMMUNITY): Payer: Self-pay | Admitting: *Deleted

## 2014-12-31 ENCOUNTER — Ambulatory Visit (INDEPENDENT_AMBULATORY_CARE_PROVIDER_SITE_OTHER): Payer: BLUE CROSS/BLUE SHIELD | Admitting: Family Medicine

## 2014-12-31 VITALS — BP 130/78 | HR 84 | Temp 97.8°F | Resp 16 | Ht 70.0 in | Wt 246.9 lb

## 2014-12-31 DIAGNOSIS — E1129 Type 2 diabetes mellitus with other diabetic kidney complication: Secondary | ICD-10-CM | POA: Diagnosis not present

## 2014-12-31 DIAGNOSIS — E1165 Type 2 diabetes mellitus with hyperglycemia: Secondary | ICD-10-CM | POA: Diagnosis not present

## 2014-12-31 DIAGNOSIS — J189 Pneumonia, unspecified organism: Secondary | ICD-10-CM | POA: Diagnosis not present

## 2014-12-31 DIAGNOSIS — I10 Essential (primary) hypertension: Secondary | ICD-10-CM | POA: Diagnosis not present

## 2014-12-31 LAB — HEMOGLOBIN A1C: Hgb A1c MFr Bld: 9.1 % — ABNORMAL HIGH (ref 4.6–6.5)

## 2014-12-31 NOTE — Progress Notes (Signed)
Subjective:    Patient ID: Anthony Bullock, male    DOB: July 26, 1960, 54 y.o.   MRN: [id number]  HPI  follow-up several items  Recent reported right lung community acquired pneumonia. Last Friday he developed some dyspnea. No chest pain. Called EMS. Was taken to [facility]. Chest x-ray reportedly showed right lung pneumonia. Patient was treated with Zithromax and is doing better Only minimal cough. No fever. Dyspnea resolved.  Chronic kidney disease with stage IV .  We'll be looking at dialysis very soon. Followed by nephrology Working on getting upper extremity vascular access His chronic peripheral edema. Currently taking Lasix 80 units twice daily  Type 2 diabetes. History of very poor compliance and very poor control. Wife states that he still consumes about 6 to sometimes 8 mountain dews per day He does not check his blood sugars regularly. Last A1c was over 15. Wife admits that recently she started [place] with his regular and had a couple of hypoglycemic events  Past Medical History  Diagnosis Date  . Allergic rhinitis   . Diabetes mellitus type II   . Hyperlipidemia   . Hypertension   . Urethral stricture   . Kidney stones   . Myocardial infarction [facility])     "stress test showed light heart attack" on 01/16/13  . Chronic systolic heart failure ([place])     a. ECHO (12/2012) EF 25-30%, HK entireanteroseptal myocardium b. Cath 01/31/13 RA 6, RV 48/5/7, PA 47/22 (33), PCW 20, Fick CO/CI 5.2/2.5, PVR 2.5  . CHF (congestive heart failure) ([place])   . Non-ischemic cardiomyopathy ([place])   . Renal insufficiency   . Diabetic neuropathy [facility])    Past Surgical History  Procedure Laterality Date  . Reapea urethral surgery for recurrent obstruction  2011  . Total knee arthroplasty  2007  . Left a nd right heart cath  01/30/2013    DR BENSIHMON  . Cardiac defibrillator placement  06/27/2013    Sub Q       BY DR Caryl Comes  . Left and right heart catheterization with  coronary angiogram N/A 01/30/2013    Procedure: LEFT AND RIGHT HEART CATHETERIZATION WITH CORONARY ANGIOGRAM;  Surgeon: Jolaine Artist, MD;  Location: [facility] CATH LAB;  Service: Cardiovascular;  Laterality: N/A;  . Implantable cardioverter defibrillator implant N/A 06/27/2013    Procedure: SUB Q ICD;  Surgeon: Deboraha Sprang, MD;  Location: [facility] C[facility] LAB;  Service: Cardiovascular;  Laterality: N/A;  . Bascilic vein transposition Right 08/22/2014    Procedure: RIGHT UPPER ARM [place];  Surgeon: Angelia Mould, MD;  Location: [place];  Service: Vascular;  Laterality: Right;    reports that he quit smoking about 4 years ago. His smoking use included Cigarettes. He has a 64 pack-year smoking history. He has never used smokeless tobacco. He reports that he does not drink alcohol or use illicit drugs. family history includes Alcohol abuse in his father; Cancer in his father and mother; Diabetes in his other; Heart Problems in his maternal grandmother; Heart disease in his maternal grandfather; Prostate cancer in his maternal grandfather; Stroke in his maternal grandmother. Allergies  Allergen Reactions  . Morphine Sulfate Rash    REACTION: Itches all over, red spots      Review of Systems  Constitutional: Negative for fatigue and unexpected weight change.  Eyes: Negative for visual disturbance.  Respiratory: Negative for cough, chest tightness and shortness of breath.   Cardiovascular: Positive for leg swelling. Negative  for chest pain and palpitations.  Endocrine: Negative for polydipsia and polyuria.  Genitourinary: Negative for dysuria.  Neurological: Negative for dizziness, syncope, weakness, light-headedness and headaches.       Objective:   Physical Exam  Constitutional: He is oriented to person, place, and time. He appears well-developed and well-nourished.  Neck: Neck supple. No thyromegaly present.  Cardiovascular: Normal rate and regular rhythm.     Pulmonary/Chest: Effort normal. He has no wheezes.  Slightly diminished breath sounds right base with a few crackles. No wheezes. No respiratory distress  Musculoskeletal: He exhibits edema.  Neurological: He is alert and oriented to person, place, and time.  Psychiatric: He has a normal mood and affect. His behavior is normal.          Assessment & Plan:  #1 recent right immunity acquired pneumonia. Clinically improved. Finish out [employer]. Follow-up promptly for any recurrent fever or other concerns  #2 hypertension stable  #3 chronic kidney disease stage IV followed by nephrology  #4 type 2 diabetes very poor control and very poor compliance. He is refused to make lifestyle changes as had extensive counseling over the years. Recheck A1c but this will be very difficult to manage without home blood sugars or better dietary compliance

## 2014-12-31 NOTE — Progress Notes (Signed)
Pre visit review using our clinic review tool, if applicable. No additional management support is needed unless otherwise documented below in the visit note. 

## 2015-01-01 ENCOUNTER — Encounter (HOSPITAL_COMMUNITY)
Admission: RE | Admit: 2015-01-01 | Discharge: 2015-01-01 | Disposition: A | Discharge status: Home / Self Care | Payer: BLUE CROSS/BLUE SHIELD | Source: Ambulatory Visit | Attending: Nephrology | Admitting: Nephrology

## 2015-01-01 DIAGNOSIS — N183 Chronic kidney disease, stage 3 (moderate): Secondary | ICD-10-CM | POA: Diagnosis not present

## 2015-01-01 LAB — RENAL FUNCTION PANEL
Albumin: 1.9 g/dL — ABNORMAL LOW (ref 3.5–5.0)
Anion gap: 12 (ref 5–15)
BUN: 64 mg/dL — ABNORMAL HIGH (ref 6–20)
CO2: 21 mmol/L — ABNORMAL LOW (ref 22–32)
Calcium: 8.1 mg/dL — ABNORMAL LOW (ref 8.9–10.3)
Chloride: 106 mmol/L (ref 101–111)
Creatinine, Ser: 6.29 mg/dL — ABNORMAL HIGH (ref 0.61–1.24)
GFR calc Af Amer: 11 mL/min — ABNORMAL LOW (ref >60)
GFR calc non Af Amer: 9 mL/min — ABNORMAL LOW (ref >60)
Glucose, Bld: 263 mg/dL — ABNORMAL HIGH (ref 65–99)
Phosphorus: 8.3 mg/dL — ABNORMAL HIGH (ref 2.5–4.6)
Potassium: 4.1 mmol/L (ref 3.5–5.1)
Sodium: 139 mmol/L (ref 135–145)

## 2015-01-01 LAB — POCT HEMOGLOBIN-HEMACUE: Hemoglobin: 10.4 g/dL — ABNORMAL LOW (ref 13.0–17.0)

## 2015-01-01 MED ORDER — DARBEPOETIN ALFA 100 MCG/0.5ML IJ SOSY
100.0000 ug | PREFILLED_SYRINGE | INTRAMUSCULAR | Status: DC
  Administered 2015-01-01: 100 ug via SUBCUTANEOUS

## 2015-01-01 MED ORDER — DARBEPOETIN ALFA 100 MCG/0.5ML IJ SOSY
PREFILLED_SYRINGE | INTRAMUSCULAR | Status: AC
  Administered 2015-01-01: 100 ug via SUBCUTANEOUS
  Filled 2015-01-01: qty 0.5

## 2015-01-02 LAB — HEPATITIS B SURFACE ANTIGEN: Hepatitis B Surface Ag: NEGATIVE

## 2015-01-06 ENCOUNTER — Encounter: Payer: Self-pay | Admitting: Family Medicine

## 2015-01-08 ENCOUNTER — Encounter (HOSPITAL_COMMUNITY): Payer: BLUE CROSS/BLUE SHIELD

## 2015-01-14 ENCOUNTER — Ambulatory Visit (INDEPENDENT_AMBULATORY_CARE_PROVIDER_SITE_OTHER): Payer: BLUE CROSS/BLUE SHIELD | Admitting: Internal Medicine

## 2015-01-14 ENCOUNTER — Encounter: Payer: Self-pay | Admitting: Internal Medicine

## 2015-01-14 VITALS — BP 128/84 | HR 76 | Ht 71.0 in | Wt 236.6 lb

## 2015-01-14 DIAGNOSIS — I428 Other cardiomyopathies: Secondary | ICD-10-CM

## 2015-01-14 DIAGNOSIS — I429 Cardiomyopathy, unspecified: Secondary | ICD-10-CM | POA: Diagnosis not present

## 2015-01-14 DIAGNOSIS — Z95810 Presence of automatic (implantable) cardiac defibrillator: Secondary | ICD-10-CM

## 2015-01-14 MED ORDER — ISOSORB DINITRATE-HYDRALAZINE 20-37.5 MG PO TABS: 1.0000 | ORAL_TABLET | Freq: Two times a day (BID) | ORAL | Status: DC

## 2015-01-14 NOTE — Patient Instructions (Signed)
Medication Instructions: 1) Stop amlodipine 2) Start Bi-Dil 20/37.5 mg one tablet by mouth twice daily  Labwork: - none  Procedures/Testing: - none  Follow-Up: - Your physician recommends that you schedule a follow-up appointment in: 2-3 weeks with a PA/ NP.  - Your physician wants you to follow-up in: 3 months with Dr. Meda Coffee ( can do a device check in the office the same day). You will receive a reminder letter in the mail two months in advance. If you don't receive a letter, please call our office to schedule the follow-up appointment.  - Your physician wants you to follow-up in: 1 year with Dr. Caryl Comes. You will receive a reminder letter in the mail two months in advance. If you don't receive a letter, please call our office to schedule the follow-up appointment.  Any Additional Special Instructions Will Be Listed Below (If Applicable).

## 2015-01-14 NOTE — Progress Notes (Signed)
Patient Care Team: [name], MD as PCP - General Elmarie Shiley, MD as Consulting Physician (Nephrology) Deboraha Sprang, MD as Consulting Physician (Cardiology)   HPI  Anthony Bullock is a 55 y.o. male Seen in follow-up for ICD implantation-subcutaneous. (6/15) He has a history of nonischemic cardiomyopathy renal insufficiency.  The patient denies chest pain, shortness of breath, nocturnal dyspnea, orthopnea  Edema still a problem Saw Nephrologist recently   No device discharges  Past Medical History  Diagnosis Date  . Allergic rhinitis   . Diabetes mellitus type II   . Hyperlipidemia   . Hypertension   . Urethral stricture   . Kidney stones   . Myocardial infarction [facility])     "stress test showed light heart attack" on 01/16/13  . Chronic systolic heart failure ([place])     a. ECHO (12/2012) EF 25-30%, HK entireanteroseptal myocardium b. Cath 01/31/13 RA 6, RV 48/5/7, PA 47/22 (33), PCW 20, Fick CO/CI 5.2/2.5, PVR 2.5  . CHF (congestive heart failure) ([place])   . Non-ischemic cardiomyopathy ([place])   . Renal insufficiency   . Diabetic neuropathy [facility])     Past Surgical History  Procedure Laterality Date  . Reapea urethral surgery for recurrent obstruction  2011  . Total knee arthroplasty  2007  . Left a nd right heart cath  01/30/2013    DR BENSIHMON  . Cardiac defibrillator placement  06/27/2013    Sub Q       BY DR Caryl Comes  . Left and right heart catheterization with coronary angiogram N/A 01/30/2013    Procedure: LEFT AND RIGHT HEART CATHETERIZATION WITH CORONARY ANGIOGRAM;  Surgeon: Jolaine Artist, MD;  Location: [facility] CATH LAB;  Service: Cardiovascular;  Laterality: N/A;  . Implantable cardioverter defibrillator implant N/A 06/27/2013    Procedure: SUB Q ICD;  Surgeon: Deboraha Sprang, MD;  Location: [facility] CATH LAB;  Service: Cardiovascular;  Laterality: N/A;  . Bascilic vein transposition Right 08/22/2014    Procedure: RIGHT UPPER ARM [place];   Surgeon: Angelia Mould, MD;  Location: [place];  Service: Vascular;  Laterality: Right;    Current Outpatient Prescriptions  Medication Sig Dispense Refill  . amLODipine (NORVASC) 5 MG tablet Take 5 mg by mouth daily.    [name] aspirin EC 81 MG tablet Take 162 mg by mouth daily.    [name] atorvastatin (LIPITOR) 80 MG tablet Take 1 tablet (80 mg total) by mouth at bedtime. 90 tablet 3  . azelastine (ASTELIN) 0.1 % nasal spray Place 2 sprays into both nostrils 3 (three) times daily as needed for rhinitis. Use in each nostril as directed 30 mL 12  . carvedilol (COREG) 25 MG tablet Take 1 tablet (25 mg total) by mouth 2 (two) times daily with a meal. 180 tablet 3  . Darbepoetin Alfa-Polysorbate (ARANESP, ALB FREE, SURECLICK IJ) Inject as directed. One injection into skin weekly for hemoglobin levels. Patient unsure of dosage.    . fenofibrate 160 MG tablet Take 1 tablet (160 mg total) by mouth daily. 90 tablet 3  . Ferrous Sulfate (IRON) 28 MG TABS Take 1 tablet by mouth daily.    . fexofenadine (ALLEGRA) 180 MG tablet Take 180 mg by mouth daily.    . furosemide (LASIX) 40 MG tablet Take 80 mg by mouth 2 (two) times daily. Patient takes 2 in the am and 2 in the pm    . gabapentin (NEURONTIN) 300 MG capsule Take 1 capsule (300  mg total) by mouth 3 (three) times daily. 270 capsule 3  . glucose blood test strip Check 1 time daily. E11.9 One Touch Ultra Blue Test Strips 100 each 3  . HYDROcodone-acetaminophen (NORCO/VICODIN) 5-325 MG per tablet Take 1 tablet by mouth every 6 (six) hours as needed for moderate pain. 20 tablet 0  . Insulin Glargine (LANTUS SOLOSTAR) 100 UNIT/ML Solostar Pen Inject 80 Units into the skin daily at 10 pm.    . insulin lispro (HUMALOG) 100 UNIT/ML injection Inject 0-14 Units into the skin 3 (three) times daily as needed for high blood sugar (per sliding scale).    . Insulin Pen Needle 32G X 4 MM MISC Check 1 time daily. E11.9 100 each 3  . Olopatadine HCl (PATADAY) 0.2 % SOLN  Place 1 drop into both eyes daily as needed (for allergies).     . Vitamin D, Ergocalciferol, (DRISDOL) 50000 UNITS CAPS capsule Take 50,000 Units by mouth every 7 (seven) days.      No current facility-administered medications for this visit.    Allergies  Allergen Reactions  . Morphine Sulfate Rash    REACTION: Itches all over, red spots    Review of Systems negative except from HPI and PMH  Physical Exam BP 128/84 mmHg  Pulse 76  Ht 5\' 11"  (1.803 m)  Wt 236 lb 9.6 oz (107.321 kg)  BMI 33.01 kg/m2 Well developed and well nourished in no acute distress HENT normal E scleral and icterus clear Neck Supple JVP flat; carotids brisk and full Clear to ausculation Device pocket well healed; without hematoma or erythema.  regular rate and rhythm, no murmurs gallops or rub Soft with active bowel sounds No clubbing cyanosis  1-2+  Edema Alert and oriented, grossly normal motor and sensory function Skin Warm and Dry  ecg demonstrates sinus rhythm at 76 Intervals 16/10/40 Assessment and  Plan  ICD-subcutaneous  Nonischemic cardiomyopathy  HFrEF  Renal insufficiency  Cr  6.3<<5.25 << 4.5 4/16  Device function is normal.    Functional status is stable  I have spoken with Dr. Meda Coffee as well as with Dr. Posey Pronto. We will discontinue his amlodipine and begin him on hydralazine nitrates for  his cardiomyopathy.  His edema mgmnt defer to renal as function is worsening   Will have him see PAwithout wheezes to make sure blood pressure is reasonably controlled on change.

## 2015-01-15 ENCOUNTER — Other Ambulatory Visit (HOSPITAL_COMMUNITY): Payer: Self-pay | Admitting: *Deleted

## 2015-01-15 ENCOUNTER — Encounter (HOSPITAL_COMMUNITY)
Admission: RE | Admit: 2015-01-15 | Discharge: 2015-01-15 | Disposition: A | Discharge status: Home / Self Care | Payer: BLUE CROSS/BLUE SHIELD | Source: Ambulatory Visit | Attending: Nephrology | Admitting: Nephrology

## 2015-01-15 DIAGNOSIS — D509 Iron deficiency anemia, unspecified: Secondary | ICD-10-CM | POA: Diagnosis not present

## 2015-01-15 DIAGNOSIS — Z5181 Encounter for therapeutic drug level monitoring: Secondary | ICD-10-CM | POA: Insufficient documentation

## 2015-01-15 DIAGNOSIS — Z79899 Other long term (current) drug therapy: Secondary | ICD-10-CM | POA: Insufficient documentation

## 2015-01-15 DIAGNOSIS — N183 Chronic kidney disease, stage 3 (moderate): Secondary | ICD-10-CM | POA: Diagnosis present

## 2015-01-15 DIAGNOSIS — D631 Anemia in chronic kidney disease: Secondary | ICD-10-CM | POA: Diagnosis not present

## 2015-01-15 LAB — POCT HEMOGLOBIN-HEMACUE: Hemoglobin: 10.4 g/dL — ABNORMAL LOW (ref 13.0–17.0)

## 2015-01-15 MED ORDER — DARBEPOETIN ALFA 100 MCG/0.5ML IJ SOSY
100.0000 ug | PREFILLED_SYRINGE | INTRAMUSCULAR | Status: DC
  Administered 2015-01-15: 100 ug via SUBCUTANEOUS

## 2015-01-15 MED ORDER — DARBEPOETIN ALFA 100 MCG/0.5ML IJ SOSY
PREFILLED_SYRINGE | INTRAMUSCULAR | Status: AC
  Administered 2015-01-15: 100 ug via SUBCUTANEOUS
  Filled 2015-01-15: qty 0.5

## 2015-01-19 ENCOUNTER — Encounter: Payer: Self-pay | Admitting: Internal Medicine

## 2015-01-20 LAB — CUP PACEART INCLINIC DEVICE CHECK
Date Time Interrogation Session: 20170110083743
Pulse Gen Model: 1010
Pulse Gen Serial Number: 18866

## 2015-01-22 ENCOUNTER — Encounter (HOSPITAL_COMMUNITY)
Admission: RE | Admit: 2015-01-22 | Discharge: 2015-01-22 | Disposition: A | Discharge status: Home / Self Care | Payer: BLUE CROSS/BLUE SHIELD | Source: Ambulatory Visit | Attending: Nephrology | Admitting: Nephrology

## 2015-01-22 DIAGNOSIS — N183 Chronic kidney disease, stage 3 (moderate): Secondary | ICD-10-CM | POA: Diagnosis not present

## 2015-01-22 LAB — IRON AND TIBC
Iron: 27 ug/dL — ABNORMAL LOW (ref 45–182)
Saturation Ratios: 8 % — ABNORMAL LOW (ref 17.9–39.5)
TIBC: 332 ug/dL (ref 250–450)
UIBC: 305 ug/dL

## 2015-01-22 LAB — FERRITIN: Ferritin: 70 ng/mL (ref 24–336)

## 2015-01-22 LAB — POCT HEMOGLOBIN-HEMACUE: Hemoglobin: 9.7 g/dL — ABNORMAL LOW (ref 13.0–17.0)

## 2015-01-22 MED ORDER — DARBEPOETIN ALFA 100 MCG/0.5ML IJ SOSY
100.0000 ug | PREFILLED_SYRINGE | INTRAMUSCULAR | Status: DC
  Administered 2015-01-22: 100 ug via SUBCUTANEOUS

## 2015-01-22 MED ORDER — DARBEPOETIN ALFA 100 MCG/0.5ML IJ SOSY
PREFILLED_SYRINGE | INTRAMUSCULAR | Status: AC
  Filled 2015-01-22: qty 0.5

## 2015-01-23 ENCOUNTER — Telehealth: Payer: Self-pay | Admitting: Family Medicine

## 2015-01-23 DIAGNOSIS — I739 Peripheral vascular disease, unspecified: Secondary | ICD-10-CM

## 2015-01-23 NOTE — Telephone Encounter (Signed)
Pt fell and would like order for in home physical therapy. Pt decline to make an appt due to having trouble walking.

## 2015-01-26 ENCOUNTER — Ambulatory Visit (HOSPITAL_COMMUNITY)
Admission: RE | Admit: 2015-01-26 | Discharge: 2015-01-26 | Disposition: A | Discharge status: Home / Self Care | Payer: BLUE CROSS/BLUE SHIELD | Source: Ambulatory Visit | Attending: Vascular Surgery | Admitting: Vascular Surgery

## 2015-01-26 ENCOUNTER — Other Ambulatory Visit: Payer: Self-pay | Admitting: *Deleted

## 2015-01-26 ENCOUNTER — Encounter (HOSPITAL_COMMUNITY)
Admission: RE | Disposition: A | Discharge status: Home / Self Care | Payer: Self-pay | Source: Ambulatory Visit | Attending: Vascular Surgery

## 2015-01-26 ENCOUNTER — Encounter (HOSPITAL_COMMUNITY): Payer: Self-pay | Admitting: Vascular Surgery

## 2015-01-26 DIAGNOSIS — Z794 Long term (current) use of insulin: Secondary | ICD-10-CM | POA: Diagnosis not present

## 2015-01-26 DIAGNOSIS — M170 Bilateral primary osteoarthritis of knee: Secondary | ICD-10-CM | POA: Diagnosis not present

## 2015-01-26 DIAGNOSIS — Z87891 Personal history of nicotine dependence: Secondary | ICD-10-CM | POA: Diagnosis not present

## 2015-01-26 DIAGNOSIS — F4321 Adjustment disorder with depressed mood: Secondary | ICD-10-CM | POA: Insufficient documentation

## 2015-01-26 DIAGNOSIS — I2510 Atherosclerotic heart disease of native coronary artery without angina pectoris: Secondary | ICD-10-CM | POA: Diagnosis not present

## 2015-01-26 DIAGNOSIS — I132 Hypertensive heart and chronic kidney disease with heart failure and with stage 5 chronic kidney disease, or end stage renal disease: Secondary | ICD-10-CM | POA: Diagnosis not present

## 2015-01-26 DIAGNOSIS — J449 Chronic obstructive pulmonary disease, unspecified: Secondary | ICD-10-CM | POA: Diagnosis not present

## 2015-01-26 DIAGNOSIS — E785 Hyperlipidemia, unspecified: Secondary | ICD-10-CM | POA: Diagnosis not present

## 2015-01-26 DIAGNOSIS — I5022 Chronic systolic (congestive) heart failure: Secondary | ICD-10-CM | POA: Insufficient documentation

## 2015-01-26 DIAGNOSIS — E1140 Type 2 diabetes mellitus with diabetic neuropathy, unspecified: Secondary | ICD-10-CM | POA: Insufficient documentation

## 2015-01-26 DIAGNOSIS — T82858A Stenosis of vascular prosthetic devices, implants and grafts, initial encounter: Secondary | ICD-10-CM | POA: Insufficient documentation

## 2015-01-26 DIAGNOSIS — Z7982 Long term (current) use of aspirin: Secondary | ICD-10-CM | POA: Diagnosis not present

## 2015-01-26 DIAGNOSIS — T82898A Other specified complication of vascular prosthetic devices, implants and grafts, initial encounter: Secondary | ICD-10-CM

## 2015-01-26 DIAGNOSIS — N185 Chronic kidney disease, stage 5: Secondary | ICD-10-CM | POA: Insufficient documentation

## 2015-01-26 DIAGNOSIS — D649 Anemia, unspecified: Secondary | ICD-10-CM | POA: Diagnosis not present

## 2015-01-26 DIAGNOSIS — Z8249 Family history of ischemic heart disease and other diseases of the circulatory system: Secondary | ICD-10-CM | POA: Diagnosis not present

## 2015-01-26 DIAGNOSIS — N184 Chronic kidney disease, stage 4 (severe): Secondary | ICD-10-CM | POA: Diagnosis present

## 2015-01-26 DIAGNOSIS — I252 Old myocardial infarction: Secondary | ICD-10-CM | POA: Diagnosis not present

## 2015-01-26 DIAGNOSIS — E1122 Type 2 diabetes mellitus with diabetic chronic kidney disease: Secondary | ICD-10-CM | POA: Insufficient documentation

## 2015-01-26 DIAGNOSIS — N186 End stage renal disease: Secondary | ICD-10-CM

## 2015-01-26 DIAGNOSIS — I429 Cardiomyopathy, unspecified: Secondary | ICD-10-CM | POA: Diagnosis not present

## 2015-01-26 DIAGNOSIS — Y832 Surgical operation with anastomosis, bypass or graft as the cause of abnormal reaction of the patient, or of later complication, without mention of misadventure at the time of the procedure: Secondary | ICD-10-CM | POA: Diagnosis not present

## 2015-01-26 DIAGNOSIS — Z4931 Encounter for adequacy testing for hemodialysis: Secondary | ICD-10-CM

## 2015-01-26 DIAGNOSIS — E1165 Type 2 diabetes mellitus with hyperglycemia: Secondary | ICD-10-CM | POA: Diagnosis not present

## 2015-01-26 HISTORY — PX: PERIPHERAL VASCULAR CATHETERIZATION: SHX172C

## 2015-01-26 LAB — POCT I-STAT, CHEM 8
BUN: 58 mg/dL — ABNORMAL HIGH (ref 6–20)
Calcium, Ion: 1.02 mmol/L — ABNORMAL LOW (ref 1.12–1.23)
Chloride: 106 mmol/L (ref 101–111)
Creatinine, Ser: 6.9 mg/dL — ABNORMAL HIGH (ref 0.61–1.24)
Glucose, Bld: 181 mg/dL — ABNORMAL HIGH (ref 65–99)
HCT: 31 % — ABNORMAL LOW (ref 39.0–52.0)
Hemoglobin: 10.5 g/dL — ABNORMAL LOW (ref 13.0–17.0)
Potassium: 4 mmol/L (ref 3.5–5.1)
Sodium: 139 mmol/L (ref 135–145)
TCO2: 21 mmol/L (ref 0–100)

## 2015-01-26 SURGERY — A/V SHUNTOGRAM/FISTULAGRAM
Anesthesia: LOCAL | Laterality: Right

## 2015-01-26 MED ORDER — IODIXANOL 320 MG/ML IV SOLN
INTRAVENOUS | Status: DC | PRN
  Administered 2015-01-26: 20 mL via INTRAVENOUS

## 2015-01-26 MED ORDER — SODIUM CHLORIDE 0.9 % IV SOLN
INTRAVENOUS | Status: DC
  Administered 2015-01-26: 1000 mL via INTRAVENOUS

## 2015-01-26 MED ORDER — LIDOCAINE HCL (PF) 1 % IJ SOLN
INTRAMUSCULAR | Status: DC | PRN
  Administered 2015-01-26: 08:00:00

## 2015-01-26 MED ORDER — HEPARIN SODIUM (PORCINE) 1000 UNIT/ML IJ SOLN
INTRAMUSCULAR | Status: AC
  Filled 2015-01-26: qty 1

## 2015-01-26 MED ORDER — HEPARIN SODIUM (PORCINE) 1000 UNIT/ML IJ SOLN
INTRAMUSCULAR | Status: DC | PRN
  Administered 2015-01-26: 5000 [IU] via INTRAVENOUS

## 2015-01-26 SURGICAL SUPPLY — 15 items
BAG SNAP BAND KOVER 36X36 (MISCELLANEOUS) ×3 IMPLANT
BALLN MUSTANG 5.0X40 75 (BALLOONS) ×2
BALLOON MUSTANG 5.0X40 75 (BALLOONS) IMPLANT
COVER DOME SNAP 22 D (MISCELLANEOUS) ×2 IMPLANT
COVER PRB 48X5XTLSCP FOLD TPE (BAG) ×1 IMPLANT
COVER PROBE 5X48 (BAG) ×2
KIT ENCORE 26 ADVANTAGE (KITS) ×1 IMPLANT
KIT MICROINTRODUCER STIFF 5F (SHEATH) ×2 IMPLANT
PROTECTION STATION PRESSURIZED (MISCELLANEOUS) ×2
SHEATH PINNACLE R/O II 6F 4CM (SHEATH) ×1 IMPLANT
STATION PROTECTION PRESSURIZED (MISCELLANEOUS) ×1 IMPLANT
STOPCOCK MORSE 400PSI 3WAY (MISCELLANEOUS) ×2 IMPLANT
TRAY PV CATH (CUSTOM PROCEDURE TRAY) ×2 IMPLANT
TUBING CIL FLEX 10 FLL-RA (TUBING) ×2 IMPLANT
WIRE BENTSON .035X145CM (WIRE) ×3 IMPLANT

## 2015-01-26 NOTE — H&P (Signed)
History of Present Illness: Patient is a 55 y.o. year old male who presents for evaluation of right arm swelling. He had a right basilic vein transposition 08/2014 and has had increased swelling in the fore arm since then. He was last here 10/15/2014 the duplex showed good maturation and depth. He now is having shoulder and elbow pain from immobility.   Other medical problems include has Poorly controlled type II diabetes mellitus with renal complication ([place]); Hyperlipidemia; ADJUSTMENT DISORDER WITH DEPRESSED MOOD; Essential hypertension; ALLERGIC RHINITIS; PYELITIS/PYELONEPHRITIS DISEASES CLASSIFIED ELSW; URINARY CALCULUS; ANEMIA, MILD; Chronic kidney disease (CKD), stage IV (severe) ([place]); Diabetic peripheral neuropathy ([facility]); COPD (chronic obstructive pulmonary disease) ([place]); Acute systolic heart failure ([place]); Abnormal nuclear stress test; Chronic systolic congestive heart failure ([place]); Chronic systolic heart failure ([place]); CAD (coronary artery disease); Other primary cardiomyopathies; Osteoarthritis of multiple joints; Osteoarthritis of both knees; At high risk for falls; and Peripheral vascular disease ([place]) on his problem list.  Past Medical History  Diagnosis Date  . Allergic rhinitis   . Diabetes mellitus type II   . Hyperlipidemia   . Hypertension   . Urethral stricture   . Kidney stones   . Myocardial infarction [facility])     "stress test showed light heart attack" on 01/16/13  . Chronic systolic heart failure ([place])     a. ECHO (12/2012) EF 25-30%, HK entireanteroseptal myocardium b. Cath 01/31/13 RA 6, RV 48/5/7, PA 47/22 (33), PCW 20, Fick CO/CI 5.2/2.5, PVR 2.5  . CHF (congestive heart failure) ([place])   . Non-ischemic cardiomyopathy ([place])   . Renal insufficiency   . Diabetic neuropathy [facility])     Past Surgical History  Procedure Laterality Date  . Reapea urethral surgery for recurrent obstruction  2011  . Total  knee arthroplasty  2007  . Left a nd right heart cath  01/30/2013    DR BENSIHMON  . Cardiac defibrillator placement  06/27/2013    Sub Q BY DR Caryl Comes  . Left and right heart catheterization with coronary angiogram N/A 01/30/2013    Procedure: LEFT AND RIGHT HEART CATHETERIZATION WITH CORONARY ANGIOGRAM; Surgeon: Jolaine Artist, MD; Location: [facility] CATH LAB; Service: Cardiovascular; Laterality: N/A;  . Implantable cardioverter defibrillator implant N/A 06/27/2013    Procedure: SUB Q ICD; Surgeon: Deboraha Sprang, MD; Location: [facility] CATH LAB; Service: Cardiovascular; Laterality: N/A;  . Bascilic vein transposition Right 08/22/2014    Procedure: RIGHT UPPER ARM [place]; Surgeon: Angelia Mould, MD; Location: [facility] OR; Service: Vascular; Laterality: Right;    Social History Social History  Substance Use Topics  . Smoking status: Former Smoker -- 2.00 packs/day for 32 years    Types: Cigarettes    Quit date: 05/11/2010  . Smokeless tobacco: Never Used  . Alcohol Use: No    Family History Family History  Problem Relation Age of Onset  . Diabetes Other     pt  . Cancer Mother     bladder  . Alcohol abuse Father   . Cancer Father   . Stroke Maternal Grandmother   . Heart Problems Maternal Grandmother     unknown  . Heart disease Maternal Grandfather   . Prostate cancer Maternal Grandfather     Allergies  Allergies  Allergen Reactions  . Morphine Sulfate Rash    REACTION: Itches all over, red spots     Current Outpatient Prescriptions  Medication Sig Dispense Refill  . amLODipine (NORVASC) 2.5 MG tablet Take 1 tablet (2.5 mg total) by mouth  daily. 90 tablet 3  . aspirin EC 81 MG tablet Take 162 mg by mouth daily.    [name] atorvastatin (LIPITOR) 80 MG tablet Take 1 tablet (80 mg total) by mouth at bedtime. 90  tablet 3  . azelastine (ASTELIN) 0.1 % nasal spray Place 2 sprays into both nostrils 3 (three) times daily as needed for rhinitis. Use in each nostril as directed 30 mL 12  . carvedilol (COREG) 25 MG tablet Take 1 tablet (25 mg total) by mouth 2 (two) times daily with a meal. 180 tablet 3  . fenofibrate 160 MG tablet Take 1 tablet (160 mg total) by mouth daily. 90 tablet 3  . fexofenadine (ALLEGRA) 180 MG tablet Take 180 mg by mouth daily.    . furosemide (LASIX) 40 MG tablet Take 2 tablets in the morning. 180 tablet 3  . gabapentin (NEURONTIN) 300 MG capsule Take 1 capsule (300 mg total) by mouth 3 (three) times daily. 270 capsule 3  . glucose blood test strip Check 1 time daily. E11.9 One Touch Ultra Blue Test Strips 100 each 3  . HYDROcodone-acetaminophen (NORCO/VICODIN) 5-325 MG per tablet Take 1 tablet by mouth every 6 (six) hours as needed for moderate pain. 20 tablet 0  . insulin lispro (HUMALOG) 100 UNIT/ML injection Inject 0-14 Units into the skin 3 (three) times daily as needed for high blood sugar (per sliding scale).    . Insulin Pen Needle 32G X 4 MM MISC Check 1 time daily. E11.9 100 each 3  . LANTUS SOLOSTAR 100 UNIT/ML Solostar Pen INJECT 50 UNITS UNDER THE SKIN DAILY 45 mL 4  . Olopatadine HCl (PATADAY) 0.2 % SOLN Place 1 drop into both eyes daily as needed (for allergies).     . Vitamin D, Ergocalciferol, (DRISDOL) 50000 UNITS CAPS capsule Take 50,000 Units by mouth every 7 (seven) days.      No current facility-administered medications for this visit.    ROS:   General: No weight loss, Fever, chills  HEENT: No recent headaches, no nasal bleeding, no visual changes, no sore throat  Neurologic: No dizziness, blackouts, seizures. No recent symptoms of stroke or mini- stroke. No recent episodes of slurred speech, or temporary blindness.  Cardiac: No recent episodes of chest pain/pressure, no shortness of  breath at rest. No shortness of breath with exertion. Denies history of atrial fibrillation or irregular heartbeat  Vascular: No history of rest pain in feet. No history of claudication. No history of non-healing ulcer, No history of DVT   Pulmonary: No home oxygen, no productive cough, no hemoptysis, No asthma or wheezing  Musculoskeletal: [ ]  Arthritis, [ ]  Low back pain, [ ]  Joint pain  Hematologic:No history of hypercoagulable state. No history of easy bleeding. No history of anemia  Gastrointestinal: No hematochezia or melena, No gastroesophageal reflux, no trouble swallowing  Urinary: [ ]  chronic Kidney disease, [ ]  on HD - [ ]  MWF or [ ]  TTHS, [ ]  Burning with urination, [ ]  Frequent urination, [ ]  Difficulty urinating;   Skin: No rashes  Psychological: No history of anxiety, No history of depression   Physical Examination  Filed Vitals:   12/24/14 1509  BP: 162/78  Pulse: 79  Temp: 98.2 F (36.8 C)  TempSrc: Oral  Height: 5\' 10"  (1.778 m)  Weight: 247 lb (112.038 kg)  SpO2: 97%    Body mass index is 35.44 kg/(m^2).  General: Alert and oriented, no acute distress HEENT: Normal Neck: No bruit or JVD Pulmonary: Clear  to auscultation bilaterally Cardiac: Regular Rate and Rhythm without murmur Abdomen: Soft, non-tender, non-distended, no mass, no scars Skin: No rash Extremity Pulses: 2+ radial, brachial, right fistula has palpable thrill Neurologic: left Upper motor 5/5, right painful passive motion of elbow and shoulder  ASSESSMENT: Immobility verses venous out flow stenosis right UE. S/P basilic vein transposition.   PLAN: Elevation demonstration in the office today. Ace wrap was placed from the hand to the elbow. His wife has a sling at home for him to use as well. We will set him up for a fistulogram to r/o venous stenosis Jan 9th with Dr. Scot Dock.  Theda Sers, EMMA MAUREEN PA-C [facility] The patient was seen in conjunction with Dr. Scot Dock today.

## 2015-01-26 NOTE — Telephone Encounter (Signed)
I would go ahead with referral. He has severe peripheral neuropathy and foot drop and is a great candidate for home PT.  He can not get out on his own secondary to the  Above.

## 2015-01-26 NOTE — Op Note (Signed)
   PATIENT: [name]   MRN: [id number] DOB: 07-22-1960    DATE OF PROCEDURE: 01/26/2015  INDICATIONS: Anthony Bullock is a 55 y.o. male who underwent a right basilic vein transposition in August 2016. He has been having problems with arm swelling. He is not yet on dialysis. He presents for a fistulogram to look for central venous stenosis.  PROCEDURE:  1. Ultrasound-guided access to the right basilic vein transposition 2. fistulogram right basilic vein transposition 3. Venoplasty right basilic vein transposition (5 mm x 4 cm balloon)  SURGEON: Judeth Cornfield. Scot Dock, MD, FACS  ANESTHESIA: local with sedation   EBL: minimal  TECHNIQUE: The patient was taken to the peripheral vascular lab. The right arm was prepped and draped in usual sterile fashion. Under ultrasound guidance, after the skin was anesthetized, the right basilic vein transposition was cannulated with a micropunch needle and a micropuncture sheath introduced over a wire. A fistulogram was obtained with half-strength contrast which demonstrated a tight stenosis where the basilic vein entered the brachial vein. I elected to address this with balloon angioplasty. The patient received 5000 units of IV heparin. The micropuncture sheath was exchanged for a 6 [place] sheath over a [employer] wire. A 5 mm x 4 cm balloon was selected and was positioned across the stenosis. It was inflated to 18 atm for 1 minute. Follow up film showed residual stenosis. The size of the balloon. Appropriate for the size of the vein. I went back with the same balloon and inflated to read burst pressure (24 atm) for 1 minute. Given the concerns for dye as he is not yet on dialysis, I did not shoot a completion film. If there is still residual stenosis that he will likely need surgical patch angioplasty.  FINDINGS:  1. Patent right basilic vein transposition 2. 90% stenosis in basilic vein before entering the brachial vein. This was ballooned with some residual  stenosis remaining.  CLINICAL NOTE: I will arrange for a follow up visit in duplex. If he still has significant stenosis by duplex he will require surgical revision.  [name], MD, FACS [facility]  DATE OF DICTATION:   01/26/2015

## 2015-01-26 NOTE — Telephone Encounter (Signed)
Order entered. Pt is aware via voicemail that referral has been ordered.

## 2015-01-26 NOTE — Telephone Encounter (Signed)
Please advise on referral. Does pt need to be seen first?

## 2015-01-26 NOTE — Progress Notes (Signed)
Pt discharge teaching done. IV dc'd . Rt arm fistula site level zero the patient taken to car via wheelchair.

## 2015-01-27 ENCOUNTER — Telehealth: Payer: Self-pay | Admitting: Vascular Surgery

## 2015-01-27 NOTE — Telephone Encounter (Signed)
-----   Message from Mena Goes, RN sent at 01/26/2015  9:27 AM EST ----- Regarding: [place]el   ----- Message -----    From: Angelia Mould, MD    Sent: 01/26/2015   8:27 AM      To: [facility] Charge Pool Subject: charge and f/u                                 PROCEDURE:  1. Ultrasound-guided access to the right basilic vein transposition 2. fistulogram right basilic vein transposition 3. Venoplasty right basilic vein transposition (5 mm x 4 cm balloon)  SURGEON: Judeth Cornfield. Scot Dock, MD, FACS  He will need a follow up visit in 2-3 weeks with a duplex of his right upper arm fistula at that time. Thank you CD

## 2015-01-27 NOTE — Telephone Encounter (Signed)
Unable to reach pt by phone, mailed letter, dpm

## 2015-01-29 ENCOUNTER — Encounter (HOSPITAL_COMMUNITY)
Admission: RE | Admit: 2015-01-29 | Discharge: 2015-01-29 | Disposition: A | Discharge status: Home / Self Care | Payer: BLUE CROSS/BLUE SHIELD | Source: Ambulatory Visit | Attending: Nephrology | Admitting: Nephrology

## 2015-01-29 DIAGNOSIS — N183 Chronic kidney disease, stage 3 (moderate): Secondary | ICD-10-CM | POA: Diagnosis not present

## 2015-01-29 LAB — RENAL FUNCTION PANEL
Albumin: 2.1 g/dL — ABNORMAL LOW (ref 3.5–5.0)
Anion gap: 9 (ref 5–15)
BUN: 63 mg/dL — ABNORMAL HIGH (ref 6–20)
CO2: 22 mmol/L (ref 22–32)
Calcium: 7.8 mg/dL — ABNORMAL LOW (ref 8.9–10.3)
Chloride: 107 mmol/L (ref 101–111)
Creatinine, Ser: 6.63 mg/dL — ABNORMAL HIGH (ref 0.61–1.24)
GFR calc Af Amer: 10 mL/min — ABNORMAL LOW (ref >60)
GFR calc non Af Amer: 9 mL/min — ABNORMAL LOW (ref >60)
Glucose, Bld: 175 mg/dL — ABNORMAL HIGH (ref 65–99)
Phosphorus: 8.5 mg/dL — ABNORMAL HIGH (ref 2.5–4.6)
Potassium: 4.4 mmol/L (ref 3.5–5.1)
Sodium: 138 mmol/L (ref 135–145)

## 2015-01-29 LAB — POCT HEMOGLOBIN-HEMACUE: Hemoglobin: 10 g/dL — ABNORMAL LOW (ref 13.0–17.0)

## 2015-01-29 MED ORDER — DARBEPOETIN ALFA 100 MCG/0.5ML IJ SOSY
PREFILLED_SYRINGE | INTRAMUSCULAR | Status: AC
  Filled 2015-01-29: qty 0.5

## 2015-01-29 MED ORDER — DARBEPOETIN ALFA 100 MCG/0.5ML IJ SOSY
100.0000 ug | PREFILLED_SYRINGE | INTRAMUSCULAR | Status: DC
  Administered 2015-01-29: 100 ug via SUBCUTANEOUS

## 2015-01-29 NOTE — Progress Notes (Signed)
Cardiology Office Note:    Date:  01/30/2015   ID:  ARIUS KERIN, DOB [birth year], MRN [id number]  PCP:  [name], MD  Cardiologist:  Dr. Ena Dawley   Electrophysiologist:  Dr. Virl Axe   Chief Complaint  Patient presents with  . Congestive Heart Failure    Follow up    History of Present Illness:    Anthony Bullock is a 55 y.o. male with a hx of primarily nonischemic cardiomyopathy, systolic at bedtime, CAD, diabetes with diabetic nephropathy/CKD, HL. LHC in 1/15 demonstrated probable diabetic vascular disease. Initial echo in 12/14 with EF 25-30%.most recent echo in 3/15 with EF 25%. He is status post subcutaneous AICD.  Last seen by Dr. Caryl Comes 01/14/15. Amlodipine was stopped. He was placed on hydralazine and nitrates. Of note, volume management is per nephrology secondary to worsening renal function.  He returns for follow-up.   Here today with his wife. Overall, he is doing well. He recently had increased swelling in his right arm. He underwent venoplasty and his edema is improving. His lower extremity edema is overall stable. He denies significant dyspnea he denies orthopnea, PND, chest pain, syncope. Denies significant cough or wheezing.    Past Medical History  Diagnosis Date  . NICM (nonischemic cardiomyopathy) ([place])     Primarily nonischemic. Echo (12/14) with EF 25-30%. Echo (3/15) with EF 25%, mild to moderately dilated LV, normal RV size and systolic function.   . Diabetes mellitus type II   . Hyperlipidemia   . Hypertension   . Urethral stricture   . Kidney stones   . History of cardiac catheterization     a.Myoview 1/15:  There is significant left ventricular dysfunction. There may be slight scar at the apex. There is no significant ischemia. LV Ejection Fraction: 27%  //  b. RHC/LHC (1/15) with mean RA 6, PA 47/22 mean 33, mean PCWP 20, PVR 2.5 WU, CI 2.5; 80% dLAD stenosis, 70% diffuse large D.   . Chronic systolic heart failure ([place])     a.  ECHO (12/2012) EF 25-30%, HK entireanteroseptal myocardium //  b.  EF 25%, diffuse HK, grade 1 diastolic dysfunction, MAC, mild LAE, normal RVSF, trivial pericardial effusion  . Allergic rhinitis   . CKD (chronic kidney disease)   . Diabetic neuropathy ([place])   . Diabetic nephropathy [facility])     Past Surgical History  Procedure Laterality Date  . Reapea urethral surgery for recurrent obstruction  2011  . Total knee arthroplasty  2007  . Left a nd right heart cath  01/30/2013    DR BENSIHMON  . Cardiac defibrillator placement  06/27/2013    Sub Q       BY DR Caryl Comes  . Left and right heart catheterization with coronary angiogram N/A 01/30/2013    Procedure: LEFT AND RIGHT HEART CATHETERIZATION WITH CORONARY ANGIOGRAM;  Surgeon: Jolaine Artist, MD;  Location: [facility] CATH LAB;  Service: Cardiovascular;  Laterality: N/A;  . Implantable cardioverter defibrillator implant N/A 06/27/2013    Procedure: SUB Q ICD;  Surgeon: Deboraha Sprang, MD;  Location: [facility] CATH LAB;  Service: Cardiovascular;  Laterality: N/A;  . Bascilic vein transposition Right 08/22/2014    Procedure: RIGHT UPPER ARM [place];  Surgeon: Angelia Mould, MD;  Location: [place];  Service: Vascular;  Laterality: Right;  . Peripheral vascular catheterization Right 01/26/2015    Procedure: A/V Fistulagram;  Surgeon: Angelia Mould, MD;  Location: [place] CV LAB;  Service:  Cardiovascular;  Laterality: Right;    Current Medications: Outpatient Prescriptions Prior to Visit  Medication Sig Dispense Refill  . aspirin EC 81 MG tablet Take 162 mg by mouth daily.    [name] atorvastatin (LIPITOR) 80 MG tablet Take 1 tablet (80 mg total) by mouth at bedtime. 90 tablet 3  . azelastine (ASTELIN) 0.1 % nasal spray Place 2 sprays into both nostrils 3 (three) times daily as needed for rhinitis. Use in each nostril as directed 30 mL 12  . carvedilol (COREG) 25 MG tablet Take 1 tablet (25 mg total) by mouth 2 (two) times daily  with a meal. 180 tablet 3  . Darbepoetin Alfa-Polysorbate (ARANESP, ALB FREE, SURECLICK IJ) Inject as directed. One injection into skin weekly for hemoglobin levels. Patient unsure of dosage.    . fenofibrate 160 MG tablet Take 1 tablet (160 mg total) by mouth daily. 90 tablet 3  . Ferrous Sulfate (IRON) 28 MG TABS Take 1 tablet by mouth daily.    . fexofenadine (ALLEGRA) 180 MG tablet Take 180 mg by mouth daily.    . furosemide (LASIX) 40 MG tablet Take 40 mg by mouth 2 (two) times daily. Patient takes 40 MG in the am and 40MG  in the pm    . gabapentin (NEURONTIN) 300 MG capsule Take 1 capsule (300 mg total) by mouth 3 (three) times daily. 270 capsule 3  . glucose blood test strip Check 1 time daily. E11.9 One Touch Ultra Blue Test Strips 100 each 3  . HYDROcodone-acetaminophen (NORCO/VICODIN) 5-325 MG per tablet Take 1 tablet by mouth every 6 (six) hours as needed for moderate pain. 20 tablet 0  . Insulin Glargine (LANTUS SOLOSTAR) 100 UNIT/ML Solostar Pen Inject 80 Units into the skin daily at 10 pm.    . insulin lispro (HUMALOG) 100 UNIT/ML injection Inject 0-14 Units into the skin 3 (three) times daily as needed for high blood sugar (per sliding scale).    . Insulin Pen Needle 32G X 4 MM MISC Check 1 time daily. E11.9 100 each 3  . Olopatadine HCl (PATADAY) 0.2 % SOLN Place 1 drop into both eyes daily as needed (for allergies).     . Vitamin D, Ergocalciferol, (DRISDOL) 50000 UNITS CAPS capsule Take 50,000 Units by mouth every 7 (seven) days.     . isosorbide-hydrALAZINE (BIDIL) 20-37.5 MG tablet Take 1 tablet by mouth 2 (two) times daily. 180 tablet 3   No facility-administered medications prior to visit.     Allergies:   Morphine sulfate   Social History   Social History  . Marital Status: Married    Spouse Name: N/A  . Number of Children: N/A  . Years of Education: N/A   Social History Main Topics  . Smoking status: Former Smoker -- 2.00 packs/day for 32 years    Types:  Cigarettes    Quit date: 05/11/2010  . Smokeless tobacco: Never Used  . Alcohol Use: No  . Drug Use: No  . Sexual Activity: Not Asked   Other Topics Concern  . 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   ROS:   Please see the history of present illness.  Review of Systems  Cardiovascular: Positive for leg swelling.  Musculoskeletal: Positive for joint swelling.  Gastrointestinal: Positive for diarrhea.  Neurological: Positive for loss of balance.  All other systems reviewed and are negative.   Physical Exam:    VS:  BP 138/64 mmHg  Pulse 74  Ht 5\' 11"  (1.803 m)  Wt 240 lb 12.8 oz (109.226 kg)  BMI 33.60 kg/m2   GEN: Well nourished, well developed, in no acute distress HEENT: normal Neck: no JVD, no masses Cardiac: Normal S1/S2, RRR; no murmurs   Respiratory:  clear to auscultation bilaterally; no wheezing, rhonchi or rales GI: soft, nontender MS: no deformity or atrophy Skin: warm and dry, no rash Neuro:  no focal deficits  Psych: Alert and oriented x 3, normal affect  Wt Readings from Last 3 Encounters:  01/30/15 240 lb 12.8 oz (109.226 kg)  01/26/15 236 lb (107.049 kg)  01/14/15 236 lb 9.6 oz (107.321 kg)      Studies/Labs Reviewed:    EKG:  EKG is  ordered today.  The ekg ordered today demonstrates NSR, HR 75, normal axis, QTc 437 ms, no changes   Recent Labs: 06/27/2014: ALT 15 09/29/2014: TSH 2.41 12/03/2014: Magnesium 2.1 01/29/2015: BUN 63*; Creatinine, Ser 6.63*; Hemoglobin 10.0*; Potassium 4.4; Sodium 138    Recent Labs  12/03/14 1337 01/01/15 1408 01/26/15 0612 01/29/15 1338  CREATININE 5.77* 6.29* 6.90* 6.63*     Recent Lipid Panel    Component Value Date/Time   CHOL 176 06/27/2014 1125   TRIG 137.0 06/27/2014 1125   HDL 40.00 06/27/2014 1125   CHOLHDL 4 06/27/2014 1125   VLDL 27.4 06/27/2014 1125   LDLCALC 109* 06/27/2014 1125   LDLDIRECT 134.0 04/25/2011 0857    Additional studies/ records that were reviewed today include:   Carotid US 8/16 Bilat < 40%; L CCA < 50%  Echo 3/15 EF 25%, diffuse HK, grade 1 diastolic dysfunction, MAC, mild LAE, normal RVSF, trivial pericardial effusion  Myoview 1/15 There is significant left ventricular dysfunction. There may be slight scar at the apex. There is no significant ischemia. LV Ejection Fraction: 27%  LHC 01/30/13 Left main: Mild ostial tapering otherwise ok LAD: Narrow vessel (due to diffuse diabetic vasculopathy). 30% ostial. 40-50% tubular lesion in the mid section. 80% lesion distally. Large diagonal with moderate diffuse disease and 70% lesion in midsection LCX: Non dominant vessel with diffuse diabetic vasculopathy. Large OM-1. Small OM-2 and OM-3. 2 PLs. 30% mid AV groove CX lesion. 40% lesion in proximal OM-1 RCA: Large dominant vessel with just mild plaque Assessment: 1. CAD with diffuse diabetic vasculopathy. Only high grade lesion is in small distal LAD 2. Well-compensated hemodynamics Plan/Discussion: He has diffuse diabetic vasculopathy but TIMI-3 flow throughout. Suspect he has NICM. Continue medical therapy. Will hydrate overnight and watch renal function. Home in am if stable.   ASSESSMENT:    1. Chronic systolic congestive heart failure ([place])   2. NICM (nonischemic cardiomyopathy) ([place])   3. Essential hypertension   4. Coronary artery disease involving native coronary artery of native heart without angina pectoris   5. Chronic kidney disease (CKD), stage IV (severe) ([place])   6. Single implantable cardioverter-defibrillator (ICD) in situ     PLAN:    In order of problems listed above:  1. Chronic systolic CHF -  Volume management per nephrology. He is NYHA 2-2b.  2. Nonischemic cardiomyopathy - Continue beta blocker. I believe that his blood pressure will tolerate increasing BiDil to 3 times a day.  He knows to contact us if his blood pressure runs low or he feels weak at this dose. Given his chronic kidney disease, Ivabridine would not be an option. ACE inhibitor/ARB cannot be used given his chronic kidney disease. No further medication changes at this time.  3. HTN - Controlled.  4. CAD - No angina. Continue aspirin, statin, beta blocker.  5. CKD - Continue follow-up with nephrology. He has an AV fistula. Dialysis is pending.  6. ICD - Follow-up with EP as planned.  Medication Adjustments/Labs and Tests Ordered: Current medicines are reviewed at length with the patient today.  Concerns regarding medicines are outlined above.  Medication changes, Labs and Tests ordered today are outlined in the Patient Instructions noted below. Patient Instructions  Medication Instructions:  1. INCREASE BIDIL 20-37.5 MG UP TO 3 TIMES A DAY  Labwork: NONE  Testing/Procedures: NONE  Follow-Up: 05/01/15 @ 2:30 WITH DR. Meda Coffee  Any Other Special Instructions Will Be Listed Below (If Applicable).   If you need a refill on your cardiac medications before your next appointment, please call your pharmacy.       Signed, Richardson Dopp, PA-C  01/30/2015 11:05 AM    [facility] [place], [place], [place]  [address] Phone: [phone]; Fax: [phone]

## 2015-01-30 ENCOUNTER — Encounter: Payer: Self-pay | Admitting: Physician Assistant

## 2015-01-30 ENCOUNTER — Ambulatory Visit (INDEPENDENT_AMBULATORY_CARE_PROVIDER_SITE_OTHER): Payer: BLUE CROSS/BLUE SHIELD | Admitting: Physician Assistant

## 2015-01-30 VITALS — BP 138/64 | HR 74 | Ht 71.0 in | Wt 240.8 lb

## 2015-01-30 DIAGNOSIS — N184 Chronic kidney disease, stage 4 (severe): Secondary | ICD-10-CM

## 2015-01-30 DIAGNOSIS — I10 Essential (primary) hypertension: Secondary | ICD-10-CM

## 2015-01-30 DIAGNOSIS — I5022 Chronic systolic (congestive) heart failure: Secondary | ICD-10-CM

## 2015-01-30 DIAGNOSIS — I429 Cardiomyopathy, unspecified: Secondary | ICD-10-CM

## 2015-01-30 DIAGNOSIS — E785 Hyperlipidemia, unspecified: Secondary | ICD-10-CM

## 2015-01-30 DIAGNOSIS — I428 Other cardiomyopathies: Secondary | ICD-10-CM

## 2015-01-30 DIAGNOSIS — I2510 Atherosclerotic heart disease of native coronary artery without angina pectoris: Secondary | ICD-10-CM | POA: Diagnosis not present

## 2015-01-30 DIAGNOSIS — Z95810 Presence of automatic (implantable) cardiac defibrillator: Secondary | ICD-10-CM

## 2015-01-30 LAB — HEPATITIS B SURFACE ANTIGEN: Hepatitis B Surface Ag: NEGATIVE

## 2015-01-30 MED ORDER — ISOSORB DINITRATE-HYDRALAZINE 20-37.5 MG PO TABS: 1.0000 | ORAL_TABLET | Freq: Three times a day (TID) | ORAL | Status: DC

## 2015-01-30 NOTE — Patient Instructions (Addendum)
Medication Instructions:  1. INCREASE BIDIL 20-37.5 MG UP TO 3 TIMES A DAY  Labwork: NONE  Testing/Procedures: NONE  Follow-Up: 05/01/15 @ 2:30 WITH DR. Meda Coffee  Any Other Special Instructions Will Be Listed Below (If Applicable).   If you need a refill on your cardiac medications before your next appointment, please call your pharmacy.

## 2015-02-02 ENCOUNTER — Other Ambulatory Visit: Payer: Self-pay | Admitting: Family Medicine

## 2015-02-02 DIAGNOSIS — I739 Peripheral vascular disease, unspecified: Secondary | ICD-10-CM

## 2015-02-05 ENCOUNTER — Encounter (HOSPITAL_COMMUNITY)
Admission: RE | Admit: 2015-02-05 | Discharge: 2015-02-05 | Disposition: A | Discharge status: Home / Self Care | Payer: BLUE CROSS/BLUE SHIELD | Source: Ambulatory Visit | Attending: Nephrology | Admitting: Nephrology

## 2015-02-05 DIAGNOSIS — N183 Chronic kidney disease, stage 3 (moderate): Secondary | ICD-10-CM | POA: Diagnosis not present

## 2015-02-05 LAB — POCT HEMOGLOBIN-HEMACUE: Hemoglobin: 9.9 g/dL — ABNORMAL LOW (ref 13.0–17.0)

## 2015-02-05 MED ORDER — DARBEPOETIN ALFA 100 MCG/0.5ML IJ SOSY
PREFILLED_SYRINGE | INTRAMUSCULAR | Status: AC
  Filled 2015-02-05: qty 0.5

## 2015-02-05 MED ORDER — DARBEPOETIN ALFA 100 MCG/0.5ML IJ SOSY
100.0000 ug | PREFILLED_SYRINGE | INTRAMUSCULAR | Status: DC
  Administered 2015-02-05: 100 ug via SUBCUTANEOUS

## 2015-02-06 LAB — PTH, INTACT AND CALCIUM
Calcium, Total (PTH): 7.4 mg/dL — ABNORMAL LOW (ref 8.7–10.2)
PTH: 118 pg/mL — ABNORMAL HIGH (ref 15–65)

## 2015-02-10 ENCOUNTER — Encounter: Payer: Self-pay | Admitting: Physical Therapy

## 2015-02-10 ENCOUNTER — Ambulatory Visit: Payer: BLUE CROSS/BLUE SHIELD | Attending: Family Medicine | Admitting: Physical Therapy

## 2015-02-10 DIAGNOSIS — M25561 Pain in right knee: Secondary | ICD-10-CM | POA: Diagnosis present

## 2015-02-10 DIAGNOSIS — R5381 Other malaise: Secondary | ICD-10-CM | POA: Insufficient documentation

## 2015-02-10 DIAGNOSIS — M2560 Stiffness of unspecified joint, not elsewhere classified: Secondary | ICD-10-CM | POA: Insufficient documentation

## 2015-02-10 DIAGNOSIS — R531 Weakness: Secondary | ICD-10-CM | POA: Insufficient documentation

## 2015-02-10 NOTE — Therapy (Signed)
[facility] [place], [place], [address] Phone: [phone]   Fax:  [phone]  Physical Therapy Evaluation  Patient Details  Name: Anthony Bullock MRN: [id number] Date of Birth: November 09, 1960 Referring Provider: Carolann Littler MD  Encounter Date: 02/10/2015      PT End of Session - 02/10/15 1311    Visit Number 1   Number of Visits 16   Date for PT Re-Evaluation 04/07/15   PT Start Time [id number]   PT Stop Time 1420   PT Time Calculation (min) 68 min   Activity Tolerance Patient tolerated treatment well   Behavior During Therapy [facility] for tasks assessed/performed      Past Medical History  Diagnosis Date  . NICM (nonischemic cardiomyopathy) ([place])     Primarily nonischemic. Echo (12/14) with EF 25-30%. Echo (3/15) with EF 25%, mild to moderately dilated LV, normal RV size and systolic function.   . Diabetes mellitus type II   . Hyperlipidemia   . Hypertension   . Urethral stricture   . Kidney stones   . History of cardiac catheterization     a.Myoview 1/15:  There is significant left ventricular dysfunction. There may be slight scar at the apex. There is no significant ischemia. LV Ejection Fraction: 27%  //  b. RHC/LHC (1/15) with mean RA 6, PA 47/22 mean 33, mean PCWP 20, PVR 2.5 WU, CI 2.5; 80% dLAD stenosis, 70% diffuse large D.   . Chronic systolic heart failure ([place])     a. ECHO (12/2012) EF 25-30%, HK entireanteroseptal myocardium //  b.  EF 25%, diffuse HK, grade 1 diastolic dysfunction, MAC, mild LAE, normal RVSF, trivial pericardial effusion  . Allergic rhinitis   . CKD (chronic kidney disease)   . Diabetic neuropathy ([place])   . Diabetic nephropathy [facility])     Past Surgical History  Procedure Laterality Date  . Reapea urethral surgery for recurrent obstruction  2011  . Total knee arthroplasty  2007  . Left a nd right heart cath  01/30/2013    DR BENSIHMON  . Cardiac defibrillator placement  06/27/2013    Sub  Q       BY DR Caryl Comes  . Left and right heart catheterization with coronary angiogram N/A 01/30/2013    Procedure: LEFT AND RIGHT HEART CATHETERIZATION WITH CORONARY ANGIOGRAM;  Surgeon: Jolaine Artist, MD;  [facility] CATH LAB;  Service: Cardiovascular;  Laterality: N/A;  . Implantable cardioverter defibrillator implant N/A 06/27/2013    Procedure: SUB Q ICD;  Surgeon: Deboraha Sprang, MD;  Location: [facility] CATH LAB;  Service: Cardiovascular;  Laterality: N/A;  . Bascilic vein transposition Right 08/22/2014    Procedure: RIGHT UPPER ARM [place];  Surgeon: Angelia Mould, MD;  Location: [place];  Service: Vascular;  Laterality: Right;  . Peripheral vascular catheterization Right 01/26/2015    Procedure: A/V Fistulagram;  Surgeon: Angelia Mould, MD;  Location: [place] CV LAB;  Service: Cardiovascular;  Laterality: Right;    There were no vitals filed for this visit.  Visit Diagnosis:  Weakness - Plan: PT plan of care cert/re-cert  Right knee pain - Plan: PT plan of care cert/re-cert  Stiffness of multiple joints - Plan: PT plan of care cert/re-cert  Debility - Plan: PT plan of care cert/re-cert      Subjective Assessment - 02/10/15 1304    Subjective Patient presents today because he is struggling with daily activities due to CHF, CKD and diabetic neuropathy  and c/o weakness in all limbs. Pt was I in the home prior to pneumonia in December, but since then has gotten weaker and had one fall. He presents today with rollator walker. Patient's wife states that his strength also depends on his hemoglobin levels which are higher right now.   Pertinent History CHF, CKD and diabetic neuropathy, HTN, R TKR 2007, cardiac defibrilator, Fistula R upper arm   Patient Stated Goals want to be able to write, drive and feed myself with R hand. Need full use of R arm and leg.   Currently in Pain? Yes   Pain Score 6    Pain Location Knee   Pain Orientation Right   Pain  Descriptors / Indicators Sharp   Pain Type Chronic pain   Pain Onset More than a month ago   Pain Frequency Constant   Aggravating Factors  standing and bending it   Pain Relieving Factors lying down   Effect of Pain on Daily Activities restricts them            [facility] PT Assessment - 02/10/15 0001    Assessment   Medical Diagnosis PVD and foot drop   Referring Provider Carolann Littler MD   Onset Date/Surgical Date 09/10/14   Next MD Visit none scheduled   Precautions   Precautions Cardiac defibrillator   Precaution Comments FALL Risk; Fistula (no pressure on upper R arm)   Balance Screen   Has the patient fallen in the past 6 months Yes   How many times? 5   Has the patient had a decrease in activity level because of a fear of falling?  Yes   Is the patient reluctant to leave their home because of a fear of falling?  Yes   Functional Tests   Functional tests Single leg stance;Other   Single Leg Stance   Comments patient rotates L toward LLE with sit to stand and puts majority of weight through LLE.   Other:   Other/ Comments Bridges mainly through LLE due to limted L knee ROM   ROM / Strength   AROM / PROM / Strength Strength;AROM   AROM   Overall AROM Comments R shoulder flex 97/125 deg; L 106/120 deg; Abd 85 R; 90 L   AROM Assessment Site Knee;Ankle   Right/Left Knee Right   Right Knee Flexion 57  59 passive with pain   Right/Left Ankle Right   Right Ankle Dorsiflexion -14  -12 passive   Strength   Overall Strength Comments B shoulder flex/abd 4+/5   Strength Assessment Site Hip;Knee;Ankle   Right/Left Hip Right;Left   Right Hip Flexion 2+/5   Right Hip ABduction 5/5  in sitting   Right Hip ADduction 5/5  in sitting   Left Hip Flexion 5/5   Right/Left Knee --  5/5 extension; R flex 4+/5 in avail range; L 5/5   Right/Left Ankle --  R DF 5/5 (inconsistent), L 5/5   Palpation   Palpation comment marked tenderness and active TPs in R quads (RF and VL); marked  tenderness of ITB.                   [facility] Adult PT Treatment/Exercise - 02/10/15 0001    Modalities   Modalities Electrical Stimulation   Electrical Stimulation   Electrical Stimulation Location R quad central and lateral x 15 min to tolerance IFC   Electrical Stimulation Goals Pain  PT Education - 02/10/15 1427    Education provided Yes   Education Details HEP; also discussed the need for OT for assistance with ADLS; Patient to f/u with insurance company.   Person(s) Educated Patient;Spouse   Methods Explanation;Demonstration;Handout   Comprehension Verbalized understanding;Returned demonstration             PT Long Term Goals - 02/10/15 1553    PT LONG TERM GOAL #1   Title I with HEP   Time 4   Period Weeks   Status New   PT LONG TERM GOAL #2   Title improved R knee flexion to 90 degrees to improve function   Time 8   Period Weeks   Status New   PT LONG TERM GOAL #3   Title decreased pain in RLE to 3/10 with weightbearing   Time 8   Period Weeks   Status New   PT LONG TERM GOAL #4   Title demo 4+/5 R hip flexor strength or better to asisst with patient returning to driving.   Time 8   Period Weeks   Status New   PT LONG TERM GOAL #5   Title improved R ankle active DF to neutral or better to improve gait.   Time 8   Period Weeks   Status New   Additional Long Term Goals   Additional Long Term Goals Yes   PT LONG TERM GOAL #6   Title Patient to report no falls or giving out of knee in 4 weeks previous.   Time 8   Period Weeks   Status New               Plan - 02/10/15 1428    Clinical Impression Statement Patient presents with c/o overall weakness due to multiple co-morbidities and of R knee pain due to multiple falls. Patient voiced a fear of falling while assessing sit to stand. Patient requires use of rollator walker for amb since December when he had pneumonia. He ambulates in a bent over posture, but with a  quick gait, compensating for decreased R knee flexion and ankle DF. He also has weakness in BUE and is concerned about his shoulders freezing up.  He and his wife also voiced concerns about his difficulty with ADLs at home including bathing.  PT discussed the need for OT and that patient may be appropriate for [facility].   Pt will benefit from skilled therapeutic intervention in order to improve on the following deficits Difficulty walking;Impaired UE functional use;Decreased endurance;Decreased activity tolerance;Pain;Decreased range of motion;Decreased strength;Increased edema;Decreased mobility;Impaired flexibility;Decreased balance   Rehab Potential Good   PT Frequency 2x / week   PT Duration 8 weeks   PT Treatment/Interventions ADLs/Self Care Home Management;Electrical Stimulation;Cryotherapy;Moist Heat;Ultrasound;Gait training;Therapeutic exercise;Manual techniques;Patient/family education;Neuromuscular re-education;Balance training;Passive range of motion;Dry needling;Vasopneumatic Device   PT Next Visit Plan STW/Manual to R quad and ITB, PROM R knee, ankle; BLE strenghthening (include weight shifting, TKE, SDLY hip ABD, clam, bridge); Also address BUE ROM and strength. Modalities for pain. (estim okay with defibrillator)   PT Home Exercise Plan bridging, BUE flexion stretch, supine hip flexion   Recommended Other Services OT   Consulted and Agree with Plan of Care Patient;Family member/caregiver         Problem List Patient Active Problem List   Diagnosis Date Noted  . At high risk for falls 09/29/2014  . Peripheral vascular disease ([place]) 09/29/2014  . Osteoarthritis of both knees 01/07/2014  . Osteoarthritis of multiple joints 10/06/2013  .  NICM (nonischemic cardiomyopathy) ([place]) 06/27/2013  . CAD (coronary artery disease) 03/13/2013  . Abnormal nuclear stress test 01/27/2013  . Chronic systolic congestive heart failure ([place]) 01/27/2013  . COPD (chronic obstructive pulmonary disease)  ([place]) 12/27/2012  . Diabetic peripheral neuropathy ([place]) 02/10/2012  . ANEMIA, MILD 01/18/2010  . Chronic kidney disease (CKD), stage IV (severe) ([place]) 01/18/2010  . PYELITIS/PYELONEPHRITIS DISEASES CLASSIFIED ELSW 09/28/2009  . URINARY CALCULUS 09/28/2009  . ADJUSTMENT DISORDER WITH DEPRESSED MOOD 08/21/2009  . Poorly controlled type II diabetes mellitus with renal complication ([place]) [id number]  . Hyperlipidemia 04/14/2008  . Essential hypertension 04/14/2008  . ALLERGIC RHINITIS 04/14/2008    Madelyn Flavors PT  02/10/2015, 4:24 PM  [facility] [address] [place], [place], [address] Phone: [phone]   Fax:  [phone]  Name: Anthony Bullock MRN: [id number] Date of Birth: 17-Oct-1960

## 2015-02-11 ENCOUNTER — Ambulatory Visit: Payer: BLUE CROSS/BLUE SHIELD | Attending: Family Medicine | Admitting: Physical Therapy

## 2015-02-11 ENCOUNTER — Encounter: Payer: Self-pay | Admitting: Physical Therapy

## 2015-02-11 DIAGNOSIS — R5381 Other malaise: Secondary | ICD-10-CM | POA: Diagnosis present

## 2015-02-11 DIAGNOSIS — M2560 Stiffness of unspecified joint, not elsewhere classified: Secondary | ICD-10-CM | POA: Diagnosis present

## 2015-02-11 DIAGNOSIS — R531 Weakness: Secondary | ICD-10-CM | POA: Diagnosis present

## 2015-02-11 DIAGNOSIS — M25561 Pain in right knee: Secondary | ICD-10-CM | POA: Insufficient documentation

## 2015-02-11 NOTE — Therapy (Signed)
[facility] [place], [place], [address] Phone: [phone]   Fax:  [phone]  Physical Therapy Treatment  Patient Details  Name: Anthony Bullock MRN: [id number] Date of Birth: January 03, 1961 Referring Provider: Carolann Littler MD  Encounter Date: 02/11/2015      PT End of Session - 02/11/15 1438    Visit Number 2   Number of Visits 16   Date for PT Re-Evaluation 04/07/15   PT Start Time 7867   PT Stop Time 1526   PT Time Calculation (min) 54 min   Activity Tolerance Patient tolerated treatment well   Behavior During Therapy [facility] for tasks assessed/performed      Past Medical History  Diagnosis Date  . NICM (nonischemic cardiomyopathy) ([place])     Primarily nonischemic. Echo (12/14) with EF 25-30%. Echo (3/15) with EF 25%, mild to moderately dilated LV, normal RV size and systolic function.   . Diabetes mellitus type II   . Hyperlipidemia   . Hypertension   . Urethral stricture   . Kidney stones   . History of cardiac catheterization     a.Myoview 1/15:  There is significant left ventricular dysfunction. There may be slight scar at the apex. There is no significant ischemia. LV Ejection Fraction: 27%  //  b. RHC/LHC (1/15) with mean RA 6, PA 47/22 mean 33, mean PCWP 20, PVR 2.5 WU, CI 2.5; 80% dLAD stenosis, 70% diffuse large D.   . Chronic systolic heart failure ([place])     a. ECHO (12/2012) EF 25-30%, HK entireanteroseptal myocardium //  b.  EF 25%, diffuse HK, grade 1 diastolic dysfunction, MAC, mild LAE, normal RVSF, trivial pericardial effusion  . Allergic rhinitis   . CKD (chronic kidney disease)   . Diabetic neuropathy ([place])   . Diabetic nephropathy [facility])     Past Surgical History  Procedure Laterality Date  . Reapea urethral surgery for recurrent obstruction  2011  . Total knee arthroplasty  2007  . Left a nd right heart cath  01/30/2013    DR BENSIHMON  . Cardiac defibrillator placement  06/27/2013    Sub Q        BY DR Caryl Comes  . Left and right heart catheterization with coronary angiogram N/A 01/30/2013    Procedure: LEFT AND RIGHT HEART CATHETERIZATION WITH CORONARY ANGIOGRAM;  Surgeon: Jolaine Artist, MD;  Location: [facility] CATH LAB;  Service: Cardiovascular;  Laterality: N/A;  . Implantable cardioverter defibrillator implant N/A 06/27/2013    Procedure: SUB Q ICD;  Surgeon: Deboraha Sprang, MD;  Location: [facility] CATH LAB;  Service: Cardiovascular;  Laterality: N/A;  . Bascilic vein transposition Right 08/22/2014    Procedure: RIGHT UPPER ARM [place];  Surgeon: Angelia Mould, MD;  Location: [place];  Service: Vascular;  Laterality: Right;  . Peripheral vascular catheterization Right 01/26/2015    Procedure: A/V Fistulagram;  Surgeon: Angelia Mould, MD;  Location: [place] CV LAB;  Service: Cardiovascular;  Laterality: Right;    There were no vitals filed for this visit.  Visit Diagnosis:  Weakness  Right knee pain  Stiffness of multiple joints  Debility      Subjective Assessment - 02/11/15 1435    Subjective Reports that her R lateral thigh pain is from his fall several weeks ago. Wife reports that she contacted [place] regarding [place] therapy and it would be 140$ per visit due to deductible not being met but she states patient would rather come to outpatient  clinic.   Pertinent History CHF, CKD and diabetic neuropathy, HTN, R TKR 2007, cardiac defibrilator, Fistula R upper arm   Patient Stated Goals want to be able to write, drive and feed myself with R hand. Need full use of R arm and leg.   Currently in Pain? Yes   Pain Score 5    Pain Location Leg   Pain Orientation Lateral;Right   Pain Descriptors / Indicators Dull   Pain Type Chronic pain   Pain Onset More than a month ago   Multiple Pain Sites Yes   Pain Location Shoulder   Pain Orientation Left;Right   Pain Descriptors / Indicators Sore            [facility] PT Assessment - 02/11/15 0001     Assessment   Medical Diagnosis PVD and foot drop   Onset Date/Surgical Date 09/10/14   Next MD Visit none scheduled   Precautions   Precaution Comments FALL Risk; Fistula (no pressure on upper R arm)                     [facility] Adult PT Treatment/Exercise - 02/11/15 0001    Exercises   Exercises Shoulder;Knee/Hip   Knee/Hip Exercises: Aerobic   Nustep L3 x15 min   Knee/Hip Exercises: Seated   Long Arc Quad Strengthening;Left;2 sets;10 reps;Weights   Long Arc Quad Weight 3 lbs.   Sit to Sand 15 reps;with UE support  VCs to use RLE   Knee/Hip Exercises: Supine   Bridges Strengthening;Both;2 sets;10 reps   Straight Leg Raises AROM;Both  10 reps RLE d/t pain; 20 reps with LLE   Knee/Hip Exercises: Sidelying   Clams B AROM x20 reps each   Shoulder Exercises: Supine   External Rotation AAROM;Both  3x10 reps each   Flexion AAROM;Both  3x10 reps   Shoulder Exercises: Seated   Horizontal ABduction Strengthening;Both;15 reps;Theraband   Theraband Level (Shoulder Horizontal ABduction) Level 1 (Yellow)   External Rotation Strengthening;Both;20 reps;Theraband   Theraband Level (Shoulder External Rotation) Level 1 (Yellow)   Manual Therapy   Manual Therapy Passive ROM;Myofascial release   Myofascial Release MFR/TPR to R Quad/ HS/ ITB in supine to decrease tightness and soreness   Passive ROM PROM with prolonged holds into flexion of R knee to increase R knee flexion in supine                PT Education - 02/11/15 1544    Education provided Yes   Education Details Yellow putty for B hand use for gripping and pinching   Person(s) Educated Patient   Methods Explanation;Demonstration;Verbal cues;Handout   Comprehension Verbalized understanding;Returned demonstration;Verbal cues required             PT Long Term Goals - 02/10/15 1553    PT LONG TERM GOAL #1   Title I with HEP   Time 4   Period Weeks   Status New   PT LONG TERM GOAL #2   Title improved R  knee flexion to 90 degrees to improve function   Time 8   Period Weeks   Status New   PT LONG TERM GOAL #3   Title decreased pain in RLE to 3/10 with weightbearing   Time 8   Period Weeks   Status New   PT LONG TERM GOAL #4   Title demo 4+/5 R hip flexor strength or better to asisst with patient returning to driving.   Time 8   Period Weeks  Status New   PT LONG TERM GOAL #5   Title improved R ankle active DF to neutral or better to improve gait.   Time 8   Period Weeks   Status New   Additional Long Term Goals   Additional Long Term Goals Yes   PT LONG TERM GOAL #6   Title Patient to report no falls or giving out of knee in 4 weeks previous.   Time 8   Period Weeks   Status New               Plan - 02/11/15 1530    Clinical Impression Statement Patient tolerated today's treatment but verbalized fatigue intermittantly following NuStep and seated shoulder horizontal abduction with yellow theraband. Patient ambulated into clinic with rollator with his wife and it was discussed to increase handle heigh on rollator to improve posture but patient refused due to short handle height easier for him to stand with. Tolerated LAQ strengthening well but with SLR of B knee extensor lag was present due to weakness and pain was experienced with RLE. Completed all LE exercises well but hindered by the lack of full R knee flexion. Patient presented with increased tightness in R Quad, HS, and ITB and only complaint of soreness was in region that patient stated was sore from his previous fall. Patient also requested for clinic to contact Dr. Elease Hashimoto regarding the need for a FWW and sliding board to make transfers and ambulating at home easier. R knee flexion very limited regarding ROM and firm end feel noted with PROM and prolonged hold at end range. Patient completed shoulder exercises well and only noted fatigue with seated horizontal abduction with yellow theraband. Patient also noted that he  has difficulty with grip strength as well and that was observed during theraband strengthening when patient had difficulty maintaining grip. Yellow putty given as additional exercises with instructions to grip and pinch putty in B hands as much as patient was able to improve grip strength. Patient demonstrated LLE dependance with sit to stands and VCs were given to emphasis using RLE in transfer but difficult due to lack of R knee ROM. Patient's only complaint following today's treatment was of R lateral thigh soreness.   Pt will benefit from skilled therapeutic intervention in order to improve on the following deficits Difficulty walking;Impaired UE functional use;Decreased endurance;Decreased activity tolerance;Pain;Decreased range of motion;Decreased strength;Increased edema;Decreased mobility;Impaired flexibility;Decreased balance   Rehab Potential Good   PT Frequency 2x / week   PT Duration 8 weeks   PT Treatment/Interventions ADLs/Self Care Home Management;Electrical Stimulation;Cryotherapy;Moist Heat;Ultrasound;Gait training;Therapeutic exercise;Manual techniques;Patient/family education;Neuromuscular re-education;Balance training;Passive range of motion;Dry needling;Vasopneumatic Device   PT Next Visit Plan STW/Manual to R quad and ITB, PROM R knee, ankle; BLE strenghthening (include weight shifting, TKE, SDLY hip ABD, clam, bridge); Also address BUE ROM and strength. Modalities for pain. (estim okay with defibrillator)   PT Home Exercise Plan bridging, BUE flexion stretch, supine hip flexion; yellow putty gripping for B hands   Consulted and Agree with Plan of Care Patient        Problem List Patient Active Problem List   Diagnosis Date Noted  . At high risk for falls 09/29/2014  . Peripheral vascular disease ([place]) 09/29/2014  . Osteoarthritis of both knees 01/07/2014  . Osteoarthritis of multiple joints 10/06/2013  . NICM (nonischemic cardiomyopathy) ([place]) 06/27/2013  . CAD (coronary  artery disease) 03/13/2013  . Abnormal nuclear stress test 01/27/2013  . Chronic systolic congestive heart failure ([place])  01/27/2013  . COPD (chronic obstructive pulmonary disease) ([place]) 12/27/2012  . Diabetic peripheral neuropathy ([place]) 02/10/2012  . ANEMIA, MILD 01/18/2010  . Chronic kidney disease (CKD), stage IV (severe) ([place]) 01/18/2010  . PYELITIS/PYELONEPHRITIS DISEASES CLASSIFIED ELSW 09/28/2009  . URINARY CALCULUS 09/28/2009  . ADJUSTMENT DISORDER WITH DEPRESSED MOOD 08/21/2009  . Poorly controlled type II diabetes mellitus with renal complication ([place]) 35/33/1740  . Hyperlipidemia 04/14/2008  . Essential hypertension 04/14/2008  . ALLERGIC RHINITIS 04/14/2008    Wynelle Fanny, PTA 02/11/2015, 3:46 PM  [facility] [address] [place], [place], [address] Phone: [phone]   Fax:  [phone]  Name: Anthony Bullock MRN: [id number] Date of Birth: 1961-01-05

## 2015-02-12 ENCOUNTER — Encounter (HOSPITAL_COMMUNITY)
Admission: RE | Admit: 2015-02-12 | Discharge: 2015-02-12 | Disposition: A | Discharge status: Home / Self Care | Payer: BLUE CROSS/BLUE SHIELD | Source: Ambulatory Visit | Attending: Nephrology | Admitting: Nephrology

## 2015-02-12 DIAGNOSIS — N183 Chronic kidney disease, stage 3 (moderate): Secondary | ICD-10-CM | POA: Diagnosis not present

## 2015-02-12 DIAGNOSIS — Z79899 Other long term (current) drug therapy: Secondary | ICD-10-CM | POA: Insufficient documentation

## 2015-02-12 DIAGNOSIS — D631 Anemia in chronic kidney disease: Secondary | ICD-10-CM | POA: Insufficient documentation

## 2015-02-12 DIAGNOSIS — Z5181 Encounter for therapeutic drug level monitoring: Secondary | ICD-10-CM | POA: Diagnosis not present

## 2015-02-12 DIAGNOSIS — D509 Iron deficiency anemia, unspecified: Secondary | ICD-10-CM | POA: Insufficient documentation

## 2015-02-12 DIAGNOSIS — Z0279 Encounter for issue of other medical certificate: Secondary | ICD-10-CM | POA: Diagnosis not present

## 2015-02-12 LAB — POCT HEMOGLOBIN-HEMACUE: Hemoglobin: 10.2 g/dL — ABNORMAL LOW (ref 13.0–17.0)

## 2015-02-12 MED ORDER — DARBEPOETIN ALFA 100 MCG/0.5ML IJ SOSY
PREFILLED_SYRINGE | INTRAMUSCULAR | Status: AC
  Filled 2015-02-12: qty 0.5

## 2015-02-12 MED ORDER — DARBEPOETIN ALFA 100 MCG/0.5ML IJ SOSY
100.0000 ug | PREFILLED_SYRINGE | INTRAMUSCULAR | Status: DC
  Administered 2015-02-12: 100 ug via SUBCUTANEOUS

## 2015-02-13 ENCOUNTER — Encounter: Payer: Self-pay | Admitting: Vascular Surgery

## 2015-02-13 ENCOUNTER — Ambulatory Visit (HOSPITAL_COMMUNITY)
Admission: RE | Admit: 2015-02-13 | Discharge: 2015-02-13 | Disposition: A | Discharge status: Home / Self Care | Payer: BLUE CROSS/BLUE SHIELD | Source: Ambulatory Visit | Attending: Vascular Surgery | Admitting: Vascular Surgery

## 2015-02-13 ENCOUNTER — Ambulatory Visit: Payer: BLUE CROSS/BLUE SHIELD | Admitting: *Deleted

## 2015-02-13 DIAGNOSIS — Z4931 Encounter for adequacy testing for hemodialysis: Secondary | ICD-10-CM | POA: Diagnosis not present

## 2015-02-13 DIAGNOSIS — N186 End stage renal disease: Secondary | ICD-10-CM

## 2015-02-13 DIAGNOSIS — E785 Hyperlipidemia, unspecified: Secondary | ICD-10-CM | POA: Diagnosis not present

## 2015-02-13 DIAGNOSIS — E1122 Type 2 diabetes mellitus with diabetic chronic kidney disease: Secondary | ICD-10-CM | POA: Insufficient documentation

## 2015-02-13 DIAGNOSIS — M2560 Stiffness of unspecified joint, not elsewhere classified: Secondary | ICD-10-CM

## 2015-02-13 DIAGNOSIS — I120 Hypertensive chronic kidney disease with stage 5 chronic kidney disease or end stage renal disease: Secondary | ICD-10-CM | POA: Diagnosis not present

## 2015-02-13 DIAGNOSIS — M25561 Pain in right knee: Secondary | ICD-10-CM

## 2015-02-13 DIAGNOSIS — R531 Weakness: Secondary | ICD-10-CM | POA: Diagnosis not present

## 2015-02-13 DIAGNOSIS — R5381 Other malaise: Secondary | ICD-10-CM

## 2015-02-13 DIAGNOSIS — E1140 Type 2 diabetes mellitus with diabetic neuropathy, unspecified: Secondary | ICD-10-CM | POA: Insufficient documentation

## 2015-02-13 NOTE — Therapy (Signed)
[facility] [place], [place], [address] Phone: [phone]   Fax:  [phone]  Physical Therapy Treatment  Patient Details  Name: Anthony Bullock MRN: [id number] Date of Birth: 19-May-1960 Referring Provider: Carolann Littler MD  Encounter Date: 02/13/2015      PT End of Session - 02/13/15 1002    Visit Number 3   Number of Visits 16   Date for PT Re-Evaluation 04/07/15   PT Start Time 0950   PT Stop Time 3151   PT Time Calculation (min) 51 min      Past Medical History  Diagnosis Date  . NICM (nonischemic cardiomyopathy) ([place])     Primarily nonischemic. Echo (12/14) with EF 25-30%. Echo (3/15) with EF 25%, mild to moderately dilated LV, normal RV size and systolic function.   . Diabetes mellitus type II   . Hyperlipidemia   . Hypertension   . Urethral stricture   . Kidney stones   . History of cardiac catheterization     a.Myoview 1/15:  There is significant left ventricular dysfunction. There may be slight scar at the apex. There is no significant ischemia. LV Ejection Fraction: 27%  //  b. RHC/LHC (1/15) with mean RA 6, PA 47/22 mean 33, mean PCWP 20, PVR 2.5 WU, CI 2.5; 80% dLAD stenosis, 70% diffuse large D.   . Chronic systolic heart failure ([place])     a. ECHO (12/2012) EF 25-30%, HK entireanteroseptal myocardium //  b.  EF 25%, diffuse HK, grade 1 diastolic dysfunction, MAC, mild LAE, normal RVSF, trivial pericardial effusion  . Allergic rhinitis   . CKD (chronic kidney disease)   . Diabetic neuropathy ([place])   . Diabetic nephropathy [facility])     Past Surgical History  Procedure Laterality Date  . Reapea urethral surgery for recurrent obstruction  2011  . Total knee arthroplasty  2007  . Left a nd right heart cath  01/30/2013    DR BENSIHMON  . Cardiac defibrillator placement  06/27/2013    Sub Q       BY DR Caryl Comes  . Left and right heart catheterization with coronary angiogram N/A 01/30/2013    Procedure:  LEFT AND RIGHT HEART CATHETERIZATION WITH CORONARY ANGIOGRAM;  Surgeon: Jolaine Artist, MD;  Location: [facility] CATH LAB;  Service: Cardiovascular;  Laterality: N/A;  . Implantable cardioverter defibrillator implant N/A 06/27/2013    Procedure: SUB Q ICD;  Surgeon: Deboraha Sprang, MD;  Location: [facility] CATH LAB;  Service: Cardiovascular;  Laterality: N/A;  . Bascilic vein transposition Right 08/22/2014    Procedure: RIGHT UPPER ARM [place];  Surgeon: Angelia Mould, MD;  Location: [place];  Service: Vascular;  Laterality: Right;  . Peripheral vascular catheterization Right 01/26/2015    Procedure: A/V Fistulagram;  Surgeon: Angelia Mould, MD;  Location: [place] CV LAB;  Service: Cardiovascular;  Laterality: Right;    There were no vitals filed for this visit.  Visit Diagnosis:  Weakness  Right knee pain  Stiffness of multiple joints  Debility      Subjective Assessment - 02/13/15 1233    Subjective Reports that his R lateral thigh pain is from his fall several weeks ago. Wife reports that she contacted [place] regarding [place] therapy and it would be 140$ per visit due to deductible not being met but she states patient would rather come to outpatient clinic.   Pertinent History CHF, CKD and diabetic neuropathy, HTN, R TKR 2007, cardiac defibrilator, Fistula  PT Frequency 2x / week   PT Duration 8 weeks   PT Treatment/Interventions ADLs/Self Care Home Management;Electrical Stimulation;Cryotherapy;Moist Heat;Ultrasound;Gait training;Therapeutic exercise;Manual techniques;Patient/family education;Neuromuscular re-education;Balance training;Passive range of motion;Dry needling;Vasopneumatic Device   PT Next Visit Plan STW/Manual to R quad and ITB, PROM R knee, ankle; BLE strenghthening (include weight shifting, TKE, SDLY hip ABD, clam, bridge); Also address BUE ROM and strength. Modalities for pain. (estim okay with defibrillator)   PT Home Exercise Plan bridging, BUE flexion stretch, supine hip flexion; yellow putty gripping for B hands   Consulted and Agree with Plan of Care Patient        Problem List Patient Active Problem List   Diagnosis Date Noted  . At high risk for falls 09/29/2014  . Peripheral vascular disease ([place]) 09/29/2014  . Osteoarthritis of both knees 01/07/2014  . Osteoarthritis of multiple joints 10/06/2013  . NICM (nonischemic cardiomyopathy) ([place]) 06/27/2013  . CAD (coronary artery disease) 03/13/2013  . Abnormal nuclear stress test 01/27/2013  . Chronic systolic congestive heart failure ([place]) 01/27/2013  . COPD (chronic obstructive pulmonary disease) ([place]) 12/27/2012  . Diabetic peripheral neuropathy ([place]) 02/10/2012  . ANEMIA, MILD 01/18/2010  . Chronic kidney disease (CKD), stage IV (severe) ([place]) 01/18/2010  . PYELITIS/PYELONEPHRITIS DISEASES CLASSIFIED ELSW 09/28/2009  . URINARY CALCULUS 09/28/2009  . ADJUSTMENT DISORDER WITH DEPRESSED MOOD 08/21/2009  . Poorly controlled type II diabetes mellitus with renal complication  ([place]) 63/01/6008  . Hyperlipidemia 04/14/2008  . Essential hypertension 04/14/2008  . ALLERGIC RHINITIS 04/14/2008    Jeanne Diefendorf,CHRIS, PTA 02/13/2015, 12:47 PM  [facility] [address] [place], [place], [address] Phone: [phone]   Fax:  [phone]  Name: Anthony Bullock MRN: [id number] Date of Birth: September 30, 1960  [facility] [place], [place], [address] Phone: [phone]   Fax:  [phone]  Physical Therapy Treatment  Patient Details  Name: Anthony Bullock MRN: [id number] Date of Birth: 19-May-1960 Referring Provider: Carolann Littler MD  Encounter Date: 02/13/2015      PT End of Session - 02/13/15 1002    Visit Number 3   Number of Visits 16   Date for PT Re-Evaluation 04/07/15   PT Start Time 0950   PT Stop Time 3151   PT Time Calculation (min) 51 min      Past Medical History  Diagnosis Date  . NICM (nonischemic cardiomyopathy) ([place])     Primarily nonischemic. Echo (12/14) with EF 25-30%. Echo (3/15) with EF 25%, mild to moderately dilated LV, normal RV size and systolic function.   . Diabetes mellitus type II   . Hyperlipidemia   . Hypertension   . Urethral stricture   . Kidney stones   . History of cardiac catheterization     a.Myoview 1/15:  There is significant left ventricular dysfunction. There may be slight scar at the apex. There is no significant ischemia. LV Ejection Fraction: 27%  //  b. RHC/LHC (1/15) with mean RA 6, PA 47/22 mean 33, mean PCWP 20, PVR 2.5 WU, CI 2.5; 80% dLAD stenosis, 70% diffuse large D.   . Chronic systolic heart failure ([place])     a. ECHO (12/2012) EF 25-30%, HK entireanteroseptal myocardium //  b.  EF 25%, diffuse HK, grade 1 diastolic dysfunction, MAC, mild LAE, normal RVSF, trivial pericardial effusion  . Allergic rhinitis   . CKD (chronic kidney disease)   . Diabetic neuropathy ([place])   . Diabetic nephropathy [facility])     Past Surgical History  Procedure Laterality Date  . Reapea urethral surgery for recurrent obstruction  2011  . Total knee arthroplasty  2007  . Left a nd right heart cath  01/30/2013    DR BENSIHMON  . Cardiac defibrillator placement  06/27/2013    Sub Q       BY DR Caryl Comes  . Left and right heart catheterization with coronary angiogram N/A 01/30/2013    Procedure:  LEFT AND RIGHT HEART CATHETERIZATION WITH CORONARY ANGIOGRAM;  Surgeon: Jolaine Artist, MD;  Location: [facility] CATH LAB;  Service: Cardiovascular;  Laterality: N/A;  . Implantable cardioverter defibrillator implant N/A 06/27/2013    Procedure: SUB Q ICD;  Surgeon: Deboraha Sprang, MD;  Location: [facility] CATH LAB;  Service: Cardiovascular;  Laterality: N/A;  . Bascilic vein transposition Right 08/22/2014    Procedure: RIGHT UPPER ARM [place];  Surgeon: Angelia Mould, MD;  Location: [place];  Service: Vascular;  Laterality: Right;  . Peripheral vascular catheterization Right 01/26/2015    Procedure: A/V Fistulagram;  Surgeon: Angelia Mould, MD;  Location: [place] CV LAB;  Service: Cardiovascular;  Laterality: Right;    There were no vitals filed for this visit.  Visit Diagnosis:  Weakness  Right knee pain  Stiffness of multiple joints  Debility      Subjective Assessment - 02/13/15 1233    Subjective Reports that his R lateral thigh pain is from his fall several weeks ago. Wife reports that she contacted [place] regarding [place] therapy and it would be 140$ per visit due to deductible not being met but she states patient would rather come to outpatient clinic.   Pertinent History CHF, CKD and diabetic neuropathy, HTN, R TKR 2007, cardiac defibrilator, Fistula

## 2015-02-16 ENCOUNTER — Ambulatory Visit: Payer: BLUE CROSS/BLUE SHIELD | Admitting: Physical Therapy

## 2015-02-16 ENCOUNTER — Encounter: Payer: Self-pay | Admitting: Physical Therapy

## 2015-02-16 DIAGNOSIS — R531 Weakness: Secondary | ICD-10-CM | POA: Diagnosis not present

## 2015-02-16 DIAGNOSIS — M2560 Stiffness of unspecified joint, not elsewhere classified: Secondary | ICD-10-CM

## 2015-02-16 DIAGNOSIS — M25561 Pain in right knee: Secondary | ICD-10-CM

## 2015-02-16 DIAGNOSIS — R5381 Other malaise: Secondary | ICD-10-CM

## 2015-02-16 NOTE — Therapy (Signed)
[facility] [place] [place], [place], [address] Phone: [phone]   Fax:  [phone]  Physical Therapy Treatment  Patient Details  Name: Anthony Bullock MRN: [id number] Date of Birth: [birth year] Referring Provider: Carolann Littler MD  Encounter Date: 02/16/2015      PT End of Session - 02/16/15 1300    Visit Number 4   Number of Visits 16   Date for PT Re-Evaluation 04/07/15   PT Start Time [id number]   PT Stop Time 1345   PT Time Calculation (min) 47 min   Activity Tolerance Patient tolerated treatment well   Behavior During Therapy [facility] for tasks assessed/performed      Past Medical History  Diagnosis Date  . NICM (nonischemic cardiomyopathy) ([place])     Primarily nonischemic. Echo (12/14) with EF 25-30%. Echo (3/15) with EF 25%, mild to moderately dilated LV, normal RV size and systolic function.   . Diabetes mellitus type II   . Hyperlipidemia   . Hypertension   . Urethral stricture   . Kidney stones   . History of cardiac catheterization     a.Myoview 1/15:  There is significant left ventricular dysfunction. There may be slight scar at the apex. There is no significant ischemia. LV Ejection Fraction: 27%  //  b. RHC/LHC (1/15) with mean RA 6, PA 47/22 mean 33, mean PCWP 20, PVR 2.5 WU, CI 2.5; 80% dLAD stenosis, 70% diffuse large D.   . Chronic systolic heart failure ([place])     a. ECHO (12/2012) EF 25-30%, HK entireanteroseptal myocardium //  b.  EF 25%, diffuse HK, grade 1 diastolic dysfunction, MAC, mild LAE, normal RVSF, trivial pericardial effusion  . Allergic rhinitis   . CKD (chronic kidney disease)   . Diabetic neuropathy ([place])   . Diabetic nephropathy [facility])     Past Surgical History  Procedure Laterality Date  . Reapea urethral surgery for recurrent obstruction  2011  . Total knee arthroplasty  2007  . Left a nd right heart cath  01/30/2013    DR BENSIHMON  . Cardiac defibrillator placement  06/27/2013    Sub Q        BY DR Caryl Comes  . Left and right heart catheterization with coronary angiogram N/A 01/30/2013    Procedure: LEFT AND RIGHT HEART CATHETERIZATION WITH CORONARY ANGIOGRAM;  Surgeon: Jolaine Artist, MD;  Location: [facility] CATH LAB;  Service: Cardiovascular;  Laterality: N/A;  . Implantable cardioverter defibrillator implant N/A 06/27/2013    Procedure: SUB Q ICD;  Surgeon: Deboraha Sprang, MD;  Location: [facility] CATH LAB;  Service: Cardiovascular;  Laterality: N/A;  . Bascilic vein transposition Right 08/22/2014    Procedure: RIGHT UPPER ARM [place];  Surgeon: Angelia Mould, MD;  Location: [place];  Service: Vascular;  Laterality: Right;  . Peripheral vascular catheterization Right 01/26/2015    Procedure: A/V Fistulagram;  Surgeon: Angelia Mould, MD;  Location: [place] CV LAB;  Service: Cardiovascular;  Laterality: Right;    There were no vitals filed for this visit.  Visit Diagnosis:  Weakness  Right knee pain  Stiffness of multiple joints  Debility      Subjective Assessment - 02/16/15 1259    Subjective Reports his hands and arms are feeling better. Reports he may be doing the putty too much as he is waking with cramps in his hands. Reports he is able to put more weight through RLE.   Pertinent History CHF, CKD and diabetic neuropathy,  HTN, R TKR 2007, cardiac defibrilator, Fistula R upper arm   Patient Stated Goals want to be able to write, drive and feed myself with R hand. Need full use of R arm and leg.   Currently in Pain? Yes   Pain Score 6    Pain Location Knee   Pain Orientation Right   Pain Descriptors / Indicators Sore   Pain Type Chronic pain   Pain Onset More than a month ago            [facility] PT Assessment - 02/16/15 0001    Assessment   Medical Diagnosis PVD and foot drop   Onset Date/Surgical Date 09/10/14   Next MD Visit none scheduled   Precautions   Precaution Comments FALL Risk; Fistula (no pressure on upper R arm)                      [facility] Adult PT Treatment/Exercise - 02/16/15 0001    Knee/Hip Exercises: Aerobic   Nustep L4 x15 min UE/LEs   Knee/Hip Exercises: Standing   Rocker Board Other (comment)  x3 min stretch; x3 min balance intermittant UE support   Other Standing Knee Exercises TKE with RED band x20 reps   Knee/Hip Exercises: Seated   Sit to Sand 1 set;10 reps;with UE support  with small lunge onto RLE   Knee/Hip Exercises: Supine   Bridges Strengthening;Both;2 sets;10 reps   Shoulder Exercises: Supine   Horizontal ABduction Strengthening;Both;20 reps;Theraband   Theraband Level (Shoulder Horizontal ABduction) Level 1 (Yellow)   External Rotation Strengthening;Both;20 reps;Theraband   Theraband Level (Shoulder External Rotation) Level 1 (Yellow)   Flexion AROM;Both;20 reps   Manual Therapy   Manual Therapy Passive ROM;Myofascial release   Myofascial Release MFR/TPR to R Quad/ HS/ ITB in supine to decrease tightness and soreness   Passive ROM PROM with prolonged holds into flexion of R knee to increase R knee flexion in supine                     PT Long Term Goals - 02/10/15 1553    PT LONG TERM GOAL #1   Title I with HEP   Time 4   Period Weeks   Status New   PT LONG TERM GOAL #2   Title improved R knee flexion to 90 degrees to improve function   Time 8   Period Weeks   Status New   PT LONG TERM GOAL #3   Title decreased pain in RLE to 3/10 with weightbearing   Time 8   Period Weeks   Status New   PT LONG TERM GOAL #4   Title demo 4+/5 R hip flexor strength or better to asisst with patient returning to driving.   Time 8   Period Weeks   Status New   PT LONG TERM GOAL #5   Title improved R ankle active DF to neutral or better to improve gait.   Time 8   Period Weeks   Status New   Additional Long Term Goals   Additional Long Term Goals Yes   PT LONG TERM GOAL #6   Title Patient to report no falls or giving out of knee in 4 weeks previous.    Time 8   Period Weeks   Status New               Plan - 02/16/15 1345    Clinical Impression Statement Patient tolerated today's treatment well with  no reports of increased pain or discomfort. Patient required intermittant UE support with balance exericse on rockerboard and patient reported that he really needed practice with activities such as that. Patient able to complete small lunge following sit to stand and has improved ability to weightbear through RLE. Patient presented in clinic again with TPs and tightness noted throughout R HS, Quad, ITB but able to note decrease in TPs and tightness following manual therapy. R knee flexion remains very limited today with PROM of R knee and R hip tightness as R hip flexion attempted for PROM. Slight pitting noted around R lower leg from sock. Patient denied increased pain following today's teatment only fatigue.   Pt will benefit from skilled therapeutic intervention in order to improve on the following deficits Difficulty walking;Impaired UE functional use;Decreased endurance;Decreased activity tolerance;Pain;Decreased range of motion;Decreased strength;Increased edema;Decreased mobility;Impaired flexibility;Decreased balance   Rehab Potential Good   PT Frequency 2x / week   PT Duration 8 weeks   PT Treatment/Interventions ADLs/Self Care Home Management;Electrical Stimulation;Cryotherapy;Moist Heat;Ultrasound;Gait training;Therapeutic exercise;Manual techniques;Patient/family education;Neuromuscular re-education;Balance training;Passive range of motion;Dry needling;Vasopneumatic Device   PT Next Visit Plan STW/Manual to R quad and ITB, PROM R knee, ankle; BLE strenghthening (include weight shifting, TKE, SDLY hip ABD, clam, bridge); Also address BUE ROM and strength. Modalities for pain. (estim okay with defibrillator)   PT Home Exercise Plan bridging, BUE flexion stretch, supine hip flexion; yellow putty gripping for B hands   Consulted and  Agree with Plan of Care Patient        Problem List Patient Active Problem List   Diagnosis Date Noted  . At high risk for falls 09/29/2014  . Peripheral vascular disease ([place]) 09/29/2014  . Osteoarthritis of both knees 01/07/2014  . Osteoarthritis of multiple joints 10/06/2013  . NICM (nonischemic cardiomyopathy) ([place]) 06/27/2013  . CAD (coronary artery disease) 03/13/2013  . Abnormal nuclear stress test 01/27/2013  . Chronic systolic congestive heart failure ([place]) 01/27/2013  . COPD (chronic obstructive pulmonary disease) ([place]) 12/27/2012  . Diabetic peripheral neuropathy ([place]) 02/10/2012  . ANEMIA, MILD 01/18/2010  . Chronic kidney disease (CKD), stage IV (severe) ([place]) 01/18/2010  . PYELITIS/PYELONEPHRITIS DISEASES CLASSIFIED ELSW 09/28/2009  . URINARY CALCULUS 09/28/2009  . ADJUSTMENT DISORDER WITH DEPRESSED MOOD 08/21/2009  . Poorly controlled type II diabetes mellitus with renal complication ([place]) [id number]  . Hyperlipidemia 04/14/2008  . Essential hypertension 04/14/2008  . ALLERGIC RHINITIS 04/14/2008    Wynelle Fanny, PTA 02/16/2015, 1:53 PM  [facility] [address] [place], [place], [address] Phone: [phone]   Fax:  [phone]  Name: Anthony Bullock MRN: [id number] Date of Birth: November 14, 1960

## 2015-02-18 ENCOUNTER — Encounter: Payer: Self-pay | Admitting: Vascular Surgery

## 2015-02-18 ENCOUNTER — Encounter: Payer: BLUE CROSS/BLUE SHIELD | Admitting: Physical Therapy

## 2015-02-18 ENCOUNTER — Ambulatory Visit (INDEPENDENT_AMBULATORY_CARE_PROVIDER_SITE_OTHER): Payer: BLUE CROSS/BLUE SHIELD | Admitting: Vascular Surgery

## 2015-02-18 VITALS — BP 151/79 | HR 81 | Temp 97.6°F | Ht 71.0 in | Wt 241.0 lb

## 2015-02-18 DIAGNOSIS — N184 Chronic kidney disease, stage 4 (severe): Secondary | ICD-10-CM | POA: Diagnosis not present

## 2015-02-18 NOTE — Progress Notes (Signed)
Patient name: Anthony Bullock MRN: [id number] DOB: [birth year] Sex: male  REASON FOR VISIT: Follow up of right basilic vein transposition.  HPI: Anthony Bullock is a 55 y.o. male wonder whether a right basilic vein transposition in August 2016. The patient was having problems with arm swelling and was not yet on dialysis. He underwent a fistulogram to look for a central venous stenosis. 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 This was ballooned with some mild residual stenosis. I felt that if on follow up duplex he still had significant stenosis he would require surgical revision.  He is not on dialysis. His arm swelling has resolved. He has no pain or paresthesias in his right arm.  Current Outpatient Prescriptions  Medication Sig Dispense Refill  . aspirin EC 81 MG tablet Take 162 mg by mouth daily.    [name] atorvastatin (LIPITOR) 80 MG tablet Take 1 tablet (80 mg total) by mouth at bedtime. 90 tablet 3  . azelastine (ASTELIN) 0.1 % nasal spray Place 2 sprays into both nostrils 3 (three) times daily as needed for rhinitis. Use in each nostril as directed 30 mL 12  . carvedilol (COREG) 25 MG tablet Take 1 tablet (25 mg total) by mouth 2 (two) times daily with a meal. 180 tablet 3  . Darbepoetin Alfa-Polysorbate (ARANESP, ALB FREE, SURECLICK IJ) Inject as directed. One injection into skin weekly for hemoglobin levels. Patient unsure of dosage.    . fenofibrate 160 MG tablet Take 1 tablet (160 mg total) by mouth daily. 90 tablet 3  . Ferrous Sulfate (IRON) 28 MG TABS Take 1 tablet by mouth daily.    . fexofenadine (ALLEGRA) 180 MG tablet Take 180 mg by mouth daily.    . furosemide (LASIX) 40 MG tablet Take 40 mg by mouth 2 (two) times daily. Patient takes 40 MG in the am and 40MG  in the pm    . gabapentin (NEURONTIN) 300 MG capsule Take 1 capsule (300 mg total) by mouth 3 (three) times daily. 270 capsule 3  . glucose blood  test strip Check 1 time daily. E11.9 One Touch Ultra Blue Test Strips 100 each 3  . HYDROcodone-acetaminophen (NORCO/VICODIN) 5-325 MG per tablet Take 1 tablet by mouth every 6 (six) hours as needed for moderate pain. 20 tablet 0  . Insulin Glargine (LANTUS SOLOSTAR) 100 UNIT/ML Solostar Pen Inject 80 Units into the skin daily at 10 pm.    . insulin lispro (HUMALOG) 100 UNIT/ML injection Inject 0-14 Units into the skin 3 (three) times daily as needed for high blood sugar (per sliding scale).    . Insulin Pen Needle 32G X 4 MM MISC Check 1 time daily. E11.9 100 each 3  . isosorbide-hydrALAZINE (BIDIL) 20-37.5 MG tablet Take 1 tablet by mouth 3 (three) times daily. 270 tablet 3  . Olopatadine HCl (PATADAY) 0.2 % SOLN Place 1 drop into both eyes daily as needed (for allergies).     . Vitamin D, Ergocalciferol, (DRISDOL) 50000 UNITS CAPS capsule Take 50,000 Units by mouth every 7 (seven) days.      No current facility-administered medications for this visit.    REVIEW OF SYSTEMS:  [X]  denotes positive finding, [ ]  denotes negative finding Cardiac  Comments:  Chest pain or chest pressure:    Shortness of breath upon exertion:    Short of breath when lying flat:    Irregular heart rhythm:  Constitutional    Fever or chills:      PHYSICAL EXAM: Filed Vitals:   02/18/15 0908 02/18/15 0910  BP: 149/79 151/79  Pulse: 81   Temp: 97.6 F (36.4 C)   TempSrc: Oral   Height: 5\' 11"  (1.803 m)   Weight: 241 lb (109.317 kg)   SpO2: 94%     GENERAL: The patient is a well-nourished male, in no acute distress. The vital signs are documented above. CARDIOVASCULAR: There is a regular rate and rhythm. PULMONARY: There is good air exchange bilaterally without wheezing or rales. He has a palpable right radial pulse. There is a good thrill in his proximal fistula although the vein does not feel especially large. The vein is not pulsatile which would suggest that the stenosis which was ballooned has not  recurred.  I reviewed his duplex that was done on 02/13/2015. The diameters of the fistula range from 0.32-0.58 cm. There is some narrowing in the proximal fistula but the outflow vein appears patent.  MEDICAL ISSUES:  STAGE IV CHRONIC KIDNEY DISEASE: The swelling in the right arm has improved after venoplasty of the outflow stenosis. The fistula is not pulsatile suggesting that there is no recurrent stenosis in that area. Our of the vein is still not adequate in size although it's gradually improving. I will order a follow up duplex scan in 6 weeks. If the fistula is continuing to enlarge and I think we can sit tight. If not then I would recommend repeating the fistulogram. I think we would need to evaluate the proximal fistula. On the initial study in order to limit contrast we were more focused on the outflow stenosis and arm swelling.  [name] [facility]: [phone]

## 2015-02-18 NOTE — Addendum Note (Signed)
Addended by: Dorthula Rue L on: 02/18/2015 10:55 AM   Modules accepted: Orders

## 2015-02-19 ENCOUNTER — Encounter (HOSPITAL_COMMUNITY)
Admission: RE | Admit: 2015-02-19 | Discharge: 2015-02-19 | Disposition: A | Discharge status: Home / Self Care | Payer: BLUE CROSS/BLUE SHIELD | Source: Ambulatory Visit | Attending: Nephrology | Admitting: Nephrology

## 2015-02-19 DIAGNOSIS — N183 Chronic kidney disease, stage 3 (moderate): Secondary | ICD-10-CM | POA: Diagnosis not present

## 2015-02-19 LAB — IRON AND TIBC
Iron: 19 ug/dL — ABNORMAL LOW (ref 45–182)
Saturation Ratios: 6 % — ABNORMAL LOW (ref 17.9–39.5)
TIBC: 316 ug/dL (ref 250–450)
UIBC: 297 ug/dL

## 2015-02-19 LAB — POCT HEMOGLOBIN-HEMACUE: Hemoglobin: 9.6 g/dL — ABNORMAL LOW (ref 13.0–17.0)

## 2015-02-19 LAB — FERRITIN: Ferritin: 77 ng/mL (ref 24–336)

## 2015-02-19 MED ORDER — DARBEPOETIN ALFA 100 MCG/0.5ML IJ SOSY
100.0000 ug | PREFILLED_SYRINGE | INTRAMUSCULAR | Status: DC
  Administered 2015-02-19: 100 ug via SUBCUTANEOUS

## 2015-02-19 MED ORDER — DARBEPOETIN ALFA 100 MCG/0.5ML IJ SOSY
PREFILLED_SYRINGE | INTRAMUSCULAR | Status: AC
  Administered 2015-02-19: 100 ug via SUBCUTANEOUS
  Filled 2015-02-19: qty 0.5

## 2015-02-20 ENCOUNTER — Ambulatory Visit (INDEPENDENT_AMBULATORY_CARE_PROVIDER_SITE_OTHER)
Admission: RE | Admit: 2015-02-20 | Discharge: 2015-02-20 | Disposition: A | Discharge status: Home / Self Care | Payer: BLUE CROSS/BLUE SHIELD | Source: Ambulatory Visit | Attending: Family Medicine | Admitting: Family Medicine

## 2015-02-20 ENCOUNTER — Encounter: Payer: Self-pay | Admitting: Family Medicine

## 2015-02-20 ENCOUNTER — Ambulatory Visit (INDEPENDENT_AMBULATORY_CARE_PROVIDER_SITE_OTHER): Payer: BLUE CROSS/BLUE SHIELD | Admitting: Family Medicine

## 2015-02-20 ENCOUNTER — Encounter: Payer: BLUE CROSS/BLUE SHIELD | Admitting: Physical Therapy

## 2015-02-20 VITALS — BP 140/80 | HR 78 | Temp 97.5°F | Ht 71.0 in | Wt 238.8 lb

## 2015-02-20 DIAGNOSIS — R509 Fever, unspecified: Secondary | ICD-10-CM

## 2015-02-20 DIAGNOSIS — R059 Cough, unspecified: Secondary | ICD-10-CM

## 2015-02-20 DIAGNOSIS — R05 Cough: Secondary | ICD-10-CM

## 2015-02-20 MED ORDER — LEVOFLOXACIN 500 MG PO TABS: ORAL_TABLET | ORAL | Status: DC

## 2015-02-20 MED ORDER — HYDROCODONE-ACETAMINOPHEN 5-325 MG PO TABS: 1.0000 | ORAL_TABLET | Freq: Four times a day (QID) | ORAL | Status: DC | PRN

## 2015-02-20 NOTE — Progress Notes (Signed)
Subjective:    Patient ID: Anthony Bullock, male    DOB: 1960/02/03, 55 y.o.   MRN: [id number]  HPI Patient seen today with cough and fever early in the week. He is concerned about possibility recurrent pneumonia. He had community-acquired pneumonia last fall and states he feels somewhat similar now. Onset Tuesday of fever up to 101. He took some Tylenol and ibuprofen and fever came down. He feels occasionally short of breath. Question of intermittent wheezing. Cough mostly nonproductive. Increased malaise. Still keeping down fluids. No nausea or vomiting.  Has noted some right-sided chest pain similar to when he had pneumonia last fall. No hemoptysis.  Multiple chronic medical problems including coronary artery disease, severe chronic kidney disease, systolic heart failure, COPD, poorly controlled type 2 diabetes with peripheral neuropathy, hypertension, peripheral vascular disease, hyperlipidemia. History of very poor compliance with diet and occasionally medications  Past Medical History  Diagnosis Date  . NICM (nonischemic cardiomyopathy) ([place])     Primarily nonischemic. Echo (12/14) with EF 25-30%. Echo (3/15) with EF 25%, mild to moderately dilated LV, normal RV size and systolic function.   . Diabetes mellitus type II   . Hyperlipidemia   . Hypertension   . Urethral stricture   . Kidney stones   . History of cardiac catheterization     a.Myoview 1/15:  There is significant left ventricular dysfunction. There may be slight scar at the apex. There is no significant ischemia. LV Ejection Fraction: 27%  //  b. RHC/LHC (1/15) with mean RA 6, PA 47/22 mean 33, mean PCWP 20, PVR 2.5 WU, CI 2.5; 80% dLAD stenosis, 70% diffuse large D.   . Chronic systolic heart failure ([place])     a. ECHO (12/2012) EF 25-30%, HK entireanteroseptal myocardium //  b.  EF 25%, diffuse HK, grade 1 diastolic dysfunction, MAC, mild LAE, normal RVSF, trivial pericardial effusion  . Allergic rhinitis   . CKD  (chronic kidney disease)   . Diabetic neuropathy ([place])   . Diabetic nephropathy [facility])    Past Surgical History  Procedure Laterality Date  . Reapea urethral surgery for recurrent obstruction  2011  . Total knee arthroplasty  2007  . Left a nd right heart cath  01/30/2013    DR BENSIHMON  . Cardiac defibrillator placement  06/27/2013    Sub Q       BY DR Caryl Comes  . Left and right heart catheterization with coronary angiogram N/A 01/30/2013    Procedure: LEFT AND RIGHT HEART CATHETERIZATION WITH CORONARY ANGIOGRAM;  Surgeon: Jolaine Artist, MD;  Location: [facility] CATH LAB;  Service: Cardiovascular;  Laterality: N/A;  . Implantable cardioverter defibrillator implant N/A 06/27/2013    Procedure: SUB Q ICD;  Surgeon: Deboraha Sprang, MD;  Location: [facility] CATH LAB;  Service: Cardiovascular;  Laterality: N/A;  . Bascilic vein transposition Right 08/22/2014    Procedure: RIGHT UPPER ARM [place];  Surgeon: Angelia Mould, MD;  Location: [place];  Service: Vascular;  Laterality: Right;  . Peripheral vascular catheterization Right 01/26/2015    Procedure: A/V Fistulagram;  Surgeon: Angelia Mould, MD;  Location: [place] CV LAB;  Service: Cardiovascular;  Laterality: Right;    reports that he quit smoking about 4 years ago. His smoking use included Cigarettes. He has a 64 pack-year smoking history. He has never used smokeless tobacco. He reports that he does not drink alcohol or use illicit drugs. family history includes Alcohol abuse in his father; Cancer in his  father and mother; Diabetes in his other; Heart Problems in his maternal grandmother; Heart disease in his maternal grandfather; Prostate cancer in his maternal grandfather; Stroke in his maternal grandmother. Allergies  Allergen Reactions  . Morphine Sulfate Rash    REACTION: Itches all over, red spots      Review of Systems  Constitutional: Positive for fever and fatigue.  Respiratory: Positive for cough and  shortness of breath.   Cardiovascular: Positive for chest pain and leg swelling. Negative for palpitations.  Gastrointestinal: Negative for nausea and vomiting.  Genitourinary: Negative for dysuria.  Psychiatric/Behavioral: Negative for confusion.       Objective:   Physical Exam  Constitutional: He appears well-developed and well-nourished.  HENT:  Mouth/Throat: Oropharynx is clear and moist.  Neck: Neck supple.  Cardiovascular: Normal rate and regular rhythm.   Pulmonary/Chest: Effort normal. He has no wheezes.  A few faint rales right base. None on the left. Normal respiratory rate. No respiratory distress. No wheezing.  Lymphadenopathy:    He has no cervical adenopathy.  Neurological: He is alert.          Assessment & Plan:  Cough and fever. Concern is whether he may have recurrent community-acquired pneumonia right lower lung.  He is not toxic and is currently afebrile with normal respiratory rate and pulse oximetry 98%. It is certainly possible he has some chronic crackles related to previous pneumonia and scarring versus atelectasis. Obtain chest x-ray. Start Levaquin 500 milligrams every other day (adjusted her chronic kidney disease) 5 doses. Follow-up immediately for any vomiting, confusion, increased dyspnea, or other concerns  He has chronic kidney disease with creatinine clearance around 9 and is followed by nephrology.  He is encouraged to avoid all nonsteroidals

## 2015-02-20 NOTE — Progress Notes (Signed)
Pre visit review using our clinic review tool, if applicable. No additional management support is needed unless otherwise documented below in the visit note. 

## 2015-02-20 NOTE — Patient Instructions (Signed)

## 2015-02-23 ENCOUNTER — Encounter: Payer: Self-pay | Admitting: Physical Therapy

## 2015-02-23 ENCOUNTER — Ambulatory Visit: Payer: BLUE CROSS/BLUE SHIELD | Admitting: Physical Therapy

## 2015-02-23 DIAGNOSIS — M25561 Pain in right knee: Secondary | ICD-10-CM

## 2015-02-23 DIAGNOSIS — R531 Weakness: Secondary | ICD-10-CM

## 2015-02-23 DIAGNOSIS — R5381 Other malaise: Secondary | ICD-10-CM

## 2015-02-23 DIAGNOSIS — M2560 Stiffness of unspecified joint, not elsewhere classified: Secondary | ICD-10-CM

## 2015-02-23 NOTE — Therapy (Signed)
[facility] [place], [place], [address] Phone: [phone]   Fax:  [phone]  Physical Therapy Treatment  Patient Details  Name: Anthony Bullock MRN: [id number] Date of Birth: [birth year] Referring Provider: Carolann Littler MD  Encounter Date: 02/23/2015      PT End of Session - 02/23/15 1404    Visit Number 5   Number of Visits 16   Date for PT Re-Evaluation 04/07/15   PT Start Time 1320   PT Stop Time 1405   PT Time Calculation (min) 45 min   Activity Tolerance Patient tolerated treatment well   Behavior During Therapy [facility] for tasks assessed/performed      Past Medical History  Diagnosis Date  . NICM (nonischemic cardiomyopathy) ([place])     Primarily nonischemic. Echo (12/14) with EF 25-30%. Echo (3/15) with EF 25%, mild to moderately dilated LV, normal RV size and systolic function.   . Diabetes mellitus type II   . Hyperlipidemia   . Hypertension   . Urethral stricture   . Kidney stones   . History of cardiac catheterization     a.Myoview 1/15:  There is significant left ventricular dysfunction. There may be slight scar at the apex. There is no significant ischemia. LV Ejection Fraction: 27%  //  b. RHC/LHC (1/15) with mean RA 6, PA 47/22 mean 33, mean PCWP 20, PVR 2.5 WU, CI 2.5; 80% dLAD stenosis, 70% diffuse large D.   . Chronic systolic heart failure ([place])     a. ECHO (12/2012) EF 25-30%, HK entireanteroseptal myocardium //  b.  EF 25%, diffuse HK, grade 1 diastolic dysfunction, MAC, mild LAE, normal RVSF, trivial pericardial effusion  . Allergic rhinitis   . CKD (chronic kidney disease)   . Diabetic neuropathy ([place])   . Diabetic nephropathy [facility])     Past Surgical History  Procedure Laterality Date  . Reapea urethral surgery for recurrent obstruction  2011  . Total knee arthroplasty  2007  . Left a nd right heart cath  01/30/2013    DR BENSIHMON  . Cardiac defibrillator placement  06/27/2013    Sub Q        BY DR Caryl Comes  . Left and right heart catheterization with coronary angiogram N/A 01/30/2013    Procedure: LEFT AND RIGHT HEART CATHETERIZATION WITH CORONARY ANGIOGRAM;  Surgeon: Jolaine Artist, MD;  Location: [facility] CATH LAB;  Service: Cardiovascular;  Laterality: N/A;  . Implantable cardioverter defibrillator implant N/A 06/27/2013    Procedure: SUB Q ICD;  Surgeon: Deboraha Sprang, MD;  Location: [facility] CATH LAB;  Service: Cardiovascular;  Laterality: N/A;  . Bascilic vein transposition Right 08/22/2014    Procedure: RIGHT UPPER ARM [place];  Surgeon: Angelia Mould, MD;  Location: [place];  Service: Vascular;  Laterality: Right;  . Peripheral vascular catheterization Right 01/26/2015    Procedure: A/V Fistulagram;  Surgeon: Angelia Mould, MD;  Location: [place] CV LAB;  Service: Cardiovascular;  Laterality: Right;    There were no vitals filed for this visit.  Visit Diagnosis:  Weakness  Right knee pain  Stiffness of multiple joints  Debility      Subjective Assessment - 02/23/15 1322    Subjective Patient feels about 40% better overall   Pertinent History CHF, CKD and diabetic neuropathy, HTN, R TKR 2007, cardiac defibrilator, Fistula R upper arm   Patient Stated Goals want to be able to write, drive and feed myself with R hand. Need full  use of R arm and leg.   Currently in Pain? Yes   Pain Score 5    Pain Location Knee   Pain Orientation Right   Pain Descriptors / Indicators Sore   Pain Type Chronic pain   Pain Onset More than a month ago   Pain Frequency Constant   Aggravating Factors  standing   Pain Relieving Factors rest            [facility] PT Assessment - 02/23/15 0001    AROM   Right/Left Knee Right   Right Knee Flexion 68                     [facility] Adult PT Treatment/Exercise - 02/23/15 0001    Knee/Hip Exercises: Aerobic   Nustep L4 x15 min UE/LE, monitored for progression and activity tolerance   Knee/Hip  Exercises: Standing   Lateral Step Up Right;10 reps;Hand Hold: 2;Step Height: 6";2 sets   Forward Step Up Right;10 reps;Hand Hold: 2;Step Height: 6";3 sets   Rocker Board --  balance / calf stretching x22min   Knee/Hip Exercises: Seated   Long Arc Quad Strengthening;Both;2 sets;10 reps   Long Arc Quad Weight 3 lbs.   Sit to Sand --  x10-15 with focus on right LE bil to uni UE support   Knee/Hip Exercises: Supine   Bridges 2 sets;10 reps   Straight Leg Raise with External Rotation Strengthening;Right;2 sets;10 reps   Shoulder Exercises: Supine   Horizontal ABduction Strengthening;Both;20 reps;Theraband   Theraband Level (Shoulder Horizontal ABduction) Level 1 (Yellow)   External Rotation Strengthening;Both;20 reps;Theraband   Theraband Level (Shoulder External Rotation) Level 1 (Yellow)                     PT Long Term Goals - 02/10/15 1553    PT LONG TERM GOAL #1   Title I with HEP   Time 4   Period Weeks   Status New   PT LONG TERM GOAL #2   Title improved R knee flexion to 90 degrees to improve function   Time 8   Period Weeks   Status New   PT LONG TERM GOAL #3   Title decreased pain in RLE to 3/10 with weightbearing   Time 8   Period Weeks   Status New   PT LONG TERM GOAL #4   Title demo 4+/5 R hip flexor strength or better to asisst with patient returning to driving.   Time 8   Period Weeks   Status New   PT LONG TERM GOAL #5   Title improved R ankle active DF to neutral or better to improve gait.   Time 8   Period Weeks   Status New   Additional Long Term Goals   Additional Long Term Goals Yes   PT LONG TERM GOAL #6   Title Patient to report no falls or giving out of knee in 4 weeks previous.   Time 8   Period Weeks   Status New               Plan - 02/23/15 1405    Clinical Impression Statement Patient progressing with all activities and feels 40% better overall thus far. Patient has reported improvement with getting in and out of  car and has only had 1 LOB since he has started therapy. Today incorporated standing knee strengthening exercises  and patient able to complete with some fatigue. Unable to meet any further golas due  to strength and balance deficits.   Pt will benefit from skilled therapeutic intervention in order to improve on the following deficits Difficulty walking;Impaired UE functional use;Decreased endurance;Decreased activity tolerance;Pain;Decreased range of motion;Decreased strength;Increased edema;Decreased mobility;Impaired flexibility;Decreased balance   PT Frequency 2x / week   PT Duration 8 weeks   PT Treatment/Interventions ADLs/Self Care Home Management;Electrical Stimulation;Cryotherapy;Moist Heat;Ultrasound;Gait training;Therapeutic exercise;Manual techniques;Patient/family education;Neuromuscular re-education;Balance training;Passive range of motion;Dry needling;Vasopneumatic Device   PT Next Visit Plan STW/Manual to R quad and ITB, PROM R knee, ankle; BLE strenghthening (include weight shifting, TKE, SDLY hip ABD, clam, bridge); Also address BUE ROM and strength. Modalities for pain. (estim okay with defibrillator)   Consulted and Agree with Plan of Care Patient        Problem List Patient Active Problem List   Diagnosis Date Noted  . At high risk for falls 09/29/2014  . Peripheral vascular disease ([place]) 09/29/2014  . Osteoarthritis of both knees 01/07/2014  . Osteoarthritis of multiple joints 10/06/2013  . NICM (nonischemic cardiomyopathy) ([place]) 06/27/2013  . CAD (coronary artery disease) 03/13/2013  . Abnormal nuclear stress test 01/27/2013  . Chronic systolic congestive heart failure ([place]) 01/27/2013  . COPD (chronic obstructive pulmonary disease) ([place]) 12/27/2012  . Diabetic peripheral neuropathy ([place]) 02/10/2012  . ANEMIA, MILD 01/18/2010  . Chronic kidney disease (CKD), stage IV (severe) ([place]) 01/18/2010  . PYELITIS/PYELONEPHRITIS DISEASES CLASSIFIED ELSW 09/28/2009  . URINARY  CALCULUS 09/28/2009  . ADJUSTMENT DISORDER WITH DEPRESSED MOOD 08/21/2009  . Poorly controlled type II diabetes mellitus with renal complication ([place]) [id number]  . Hyperlipidemia 04/14/2008  . Essential hypertension 04/14/2008  . ALLERGIC RHINITIS 04/14/2008    Phillips Climes, PTA 02/23/2015, 2:17 PM  [facility] [place], [place], [address] Phone: [phone]   Fax:  [phone]  Name: CORA ZAPANTA MRN: [id number] Date of Birth: 02-Dec-1960

## 2015-02-26 ENCOUNTER — Encounter (HOSPITAL_COMMUNITY)
Admission: RE | Admit: 2015-02-26 | Discharge: 2015-02-26 | Disposition: A | Discharge status: Home / Self Care | Payer: BLUE CROSS/BLUE SHIELD | Source: Ambulatory Visit | Attending: Nephrology | Admitting: Nephrology

## 2015-02-26 DIAGNOSIS — N183 Chronic kidney disease, stage 3 (moderate): Secondary | ICD-10-CM | POA: Diagnosis not present

## 2015-02-26 LAB — RENAL FUNCTION PANEL
Albumin: 2.3 g/dL — ABNORMAL LOW (ref 3.5–5.0)
Anion gap: 11 (ref 5–15)
BUN: 85 mg/dL — ABNORMAL HIGH (ref 6–20)
CO2: 15 mmol/L — ABNORMAL LOW (ref 22–32)
Calcium: 8.1 mg/dL — ABNORMAL LOW (ref 8.9–10.3)
Chloride: 114 mmol/L — ABNORMAL HIGH (ref 101–111)
Creatinine, Ser: 7.65 mg/dL — ABNORMAL HIGH (ref 0.61–1.24)
GFR calc Af Amer: 8 mL/min — ABNORMAL LOW (ref >60)
GFR calc non Af Amer: 7 mL/min — ABNORMAL LOW (ref >60)
Glucose, Bld: 96 mg/dL (ref 65–99)
Phosphorus: 8.8 mg/dL — ABNORMAL HIGH (ref 2.5–4.6)
Potassium: 4.9 mmol/L (ref 3.5–5.1)
Sodium: 140 mmol/L (ref 135–145)

## 2015-02-26 LAB — POCT HEMOGLOBIN-HEMACUE: Hemoglobin: 10.2 g/dL — ABNORMAL LOW (ref 13.0–17.0)

## 2015-02-26 MED ORDER — DARBEPOETIN ALFA 100 MCG/0.5ML IJ SOSY
PREFILLED_SYRINGE | INTRAMUSCULAR | Status: AC
  Filled 2015-02-26: qty 0.5

## 2015-02-26 MED ORDER — DARBEPOETIN ALFA 100 MCG/0.5ML IJ SOSY
100.0000 ug | PREFILLED_SYRINGE | INTRAMUSCULAR | Status: DC
  Administered 2015-02-26: 100 ug via SUBCUTANEOUS

## 2015-02-27 ENCOUNTER — Encounter: Payer: Self-pay | Admitting: Physical Therapy

## 2015-02-27 ENCOUNTER — Ambulatory Visit: Payer: BLUE CROSS/BLUE SHIELD | Admitting: Physical Therapy

## 2015-02-27 ENCOUNTER — Encounter: Payer: BLUE CROSS/BLUE SHIELD | Admitting: Physical Therapy

## 2015-02-27 DIAGNOSIS — R531 Weakness: Secondary | ICD-10-CM

## 2015-02-27 DIAGNOSIS — M25561 Pain in right knee: Secondary | ICD-10-CM

## 2015-02-27 DIAGNOSIS — M2560 Stiffness of unspecified joint, not elsewhere classified: Secondary | ICD-10-CM

## 2015-02-27 DIAGNOSIS — R5381 Other malaise: Secondary | ICD-10-CM

## 2015-02-27 LAB — HEPATITIS B SURFACE ANTIGEN: Hepatitis B Surface Ag: NEGATIVE

## 2015-02-27 NOTE — Therapy (Signed)
[facility] [place], [place], [address] Phone: [phone]   Fax:  [phone]  Physical Therapy Treatment  Patient Details  Name: Anthony Bullock MRN: [id number] Date of Birth: May 26, 1960 Referring Provider: Carolann Littler MD  Encounter Date: 02/27/2015      PT End of Session - 02/27/15 1121    Visit Number 6   Number of Visits 16   Date for PT Re-Evaluation 04/07/15   PT Start Time 1118   PT Stop Time 1201   PT Time Calculation (min) 43 min   Activity Tolerance Patient tolerated treatment well   Behavior During Therapy [facility] for tasks assessed/performed      Past Medical History  Diagnosis Date  . NICM (nonischemic cardiomyopathy) ([place])     Primarily nonischemic. Echo (12/14) with EF 25-30%. Echo (3/15) with EF 25%, mild to moderately dilated LV, normal RV size and systolic function.   . Diabetes mellitus type II   . Hyperlipidemia   . Hypertension   . Urethral stricture   . Kidney stones   . History of cardiac catheterization     a.Myoview 1/15:  There is significant left ventricular dysfunction. There may be slight scar at the apex. There is no significant ischemia. LV Ejection Fraction: 27%  //  b. RHC/LHC (1/15) with mean RA 6, PA 47/22 mean 33, mean PCWP 20, PVR 2.5 WU, CI 2.5; 80% dLAD stenosis, 70% diffuse large D.   . Chronic systolic heart failure ([place])     a. ECHO (12/2012) EF 25-30%, HK entireanteroseptal myocardium //  b.  EF 25%, diffuse HK, grade 1 diastolic dysfunction, MAC, mild LAE, normal RVSF, trivial pericardial effusion  . Allergic rhinitis   . CKD (chronic kidney disease)   . Diabetic neuropathy ([place])   . Diabetic nephropathy [facility])     Past Surgical History  Procedure Laterality Date  . Reapea urethral surgery for recurrent obstruction  2011  . Total knee arthroplasty  2007  . Left a nd right heart cath  01/30/2013    DR BENSIHMON  . Cardiac defibrillator placement  06/27/2013    Sub Q        BY DR Caryl Comes  . Left and right heart catheterization with coronary angiogram N/A 01/30/2013    Procedure: LEFT AND RIGHT HEART CATHETERIZATION WITH CORONARY ANGIOGRAM;  Surgeon: Jolaine Artist, MD;  Location: [facility] CATH LAB;  Service: Cardiovascular;  Laterality: N/A;  . Implantable cardioverter defibrillator implant N/A 06/27/2013    Procedure: SUB Q ICD;  Surgeon: Deboraha Sprang, MD;  Location: [facility] CATH LAB;  Service: Cardiovascular;  Laterality: N/A;  . Bascilic vein transposition Right 08/22/2014    Procedure: RIGHT UPPER ARM [place];  Surgeon: Angelia Mould, MD;  Location: [place];  Service: Vascular;  Laterality: Right;  . Peripheral vascular catheterization Right 01/26/2015    Procedure: A/V Fistulagram;  Surgeon: Angelia Mould, MD;  Location: [place] CV LAB;  Service: Cardiovascular;  Laterality: Right;    There were no vitals filed for this visit.  Visit Diagnosis:  Weakness  Right knee pain  Stiffness of multiple joints  Debility      Subjective Assessment - 02/27/15 1121    Subjective Stated that he would like work on walking with a SBQC today.   Pertinent History CHF, CKD and diabetic neuropathy, HTN, R TKR 2007, cardiac defibrilator, Fistula R upper arm   Patient Stated Goals want to be able to write, drive and feed  myself with R hand. Need full use of R arm and leg.   Currently in Pain? Yes   Pain Score 5    Pain Location Knee   Pain Orientation Right   Pain Descriptors / Indicators Sore   Pain Type Chronic pain   Pain Onset More than a month ago            [facility] PT Assessment - 02/27/15 0001    Assessment   Medical Diagnosis PVD and foot drop   Onset Date/Surgical Date 09/10/14   Next MD Visit none scheduled   Precautions   Precaution Comments FALL Risk; Fistula (no pressure on upper R arm)                     [facility] Adult PT Treatment/Exercise - 02/27/15 0001    Ambulation/Gait   Ambulation/Gait Yes    Ambulation/Gait Assistance 5: Supervision   Ambulation Distance (Feet) 230 Feet   Assistive device Small based quad cane   Gait Pattern Step-through pattern;Decreased arm swing - right;Decreased hip/knee flexion - right;Decreased weight shift to right;Lateral trunk lean to left;Narrow base of support   Ambulation Surface Level;Indoor   Stairs Yes   Stairs Assistance 5: Supervision   Stair Management Technique One rail Right;Step to pattern;Forwards;With cane   Number of Stairs 4   Height of Stairs 6   Knee/Hip Exercises: Aerobic   Nustep L5 x15 min UE/LE with monitoring for activity tolerance   Knee/Hip Exercises: Standing   Lateral Step Up Right;2 sets;10 reps;Hand Hold: 2;Step Height: 6"   Forward Step Up Right;2 sets;10 reps;Hand Hold: 2;Step Height: 6"   Rocker Board Other (comment)  x7 min for calf stretching as well as balance   Knee/Hip Exercises: Seated   Long Arc Quad Strengthening;Right;2 sets;10 reps;Weights   Long Arc Quad Weight 3 lbs.   Sit to Sand 1 set;10 reps;with UE support   Shoulder Exercises: Seated   Horizontal ABduction Strengthening;Both;20 reps;Theraband   Theraband Level (Shoulder Horizontal ABduction) Level 1 (Yellow)   External Rotation Strengthening;Both;20 reps;Theraband   Theraband Level (Shoulder External Rotation) Level 1 (Yellow)                     PT Long Term Goals - 02/27/15 1157    PT LONG TERM GOAL #1   Title I with HEP   Time 4   Period Weeks   Status On-going   PT LONG TERM GOAL #2   Title improved R knee flexion to 90 degrees to improve function   Time 8   Period Weeks   Status On-going   PT LONG TERM GOAL #3   Title decreased pain in RLE to 3/10 with weightbearing   Time 8   Period Weeks   Status On-going  4/10 soreness with RLE weightbearing per patient report on 02/27/2015   PT LONG TERM GOAL #4   Title demo 4+/5 R hip flexor strength or better to asisst with patient returning to driving.   Time 8   Period  Weeks   Status On-going   PT LONG TERM GOAL #5   Title improved R ankle active DF to neutral or better to improve gait.   Time 8   Period Weeks   Status On-going   PT LONG TERM GOAL #6   Title Patient to report no falls or giving out of knee in 4 weeks previous.   Time 8   Period Weeks   Status Achieved  Plan - 02/27/15 1216    Clinical Impression Statement Patient tolerated today's treatment well today and showed great improvement regarding functional activities. Patient ambulated fairly well with [facility] and required VCing regarding use of cane in LUE due to RLE being the weaker LE. Also required VCs regarding step 2 pt step through gait pattern, all prongs on floor, and looking ahead for safety. Patient required [place] regarding stair ambulation as well with sequencing, rail use. Completed all exercises well with no reports of pain or soreness. Patient achieved falls goals today in clinic. Patient demonstrated increased strength and ability with sit to stands and patient notes decreased pain with RLE weightbearing. Patient denied any increased pain following today's treatment.   Pt will benefit from skilled therapeutic intervention in order to improve on the following deficits Difficulty walking;Impaired UE functional use;Decreased endurance;Decreased activity tolerance;Pain;Decreased range of motion;Decreased strength;Increased edema;Decreased mobility;Impaired flexibility;Decreased balance   Rehab Potential Good   PT Frequency 2x / week   PT Duration 8 weeks   PT Treatment/Interventions ADLs/Self Care Home Management;Electrical Stimulation;Cryotherapy;Moist Heat;Ultrasound;Gait training;Therapeutic exercise;Manual techniques;Patient/family education;Neuromuscular re-education;Balance training;Passive range of motion;Dry needling;Vasopneumatic Device   PT Next Visit Plan Continue LE/UE strengthening and gait training with [facility]    PT Home Exercise Plan bridging, BUE flexion  stretch, supine hip flexion; yellow putty gripping for B hands   Consulted and Agree with Plan of Care Patient        Problem List Patient Active Problem List   Diagnosis Date Noted  . At high risk for falls 09/29/2014  . Peripheral vascular disease ([place]) 09/29/2014  . Osteoarthritis of both knees 01/07/2014  . Osteoarthritis of multiple joints 10/06/2013  . NICM (nonischemic cardiomyopathy) ([place]) 06/27/2013  . CAD (coronary artery disease) 03/13/2013  . Abnormal nuclear stress test 01/27/2013  . Chronic systolic congestive heart failure ([place]) 01/27/2013  . COPD (chronic obstructive pulmonary disease) ([place]) 12/27/2012  . Diabetic peripheral neuropathy ([place]) 02/10/2012  . ANEMIA, MILD 01/18/2010  . Chronic kidney disease (CKD), stage IV (severe) ([place]) 01/18/2010  . PYELITIS/PYELONEPHRITIS DISEASES CLASSIFIED ELSW 09/28/2009  . URINARY CALCULUS 09/28/2009  . ADJUSTMENT DISORDER WITH DEPRESSED MOOD 08/21/2009  . Poorly controlled type II diabetes mellitus with renal complication ([place]) [id number]  . Hyperlipidemia 04/14/2008  . Essential hypertension 04/14/2008  . ALLERGIC RHINITIS 04/14/2008    Wynelle Fanny, PTA 02/27/2015, 12:36 PM  [facility] [place], [place], [address] Phone: [phone]   Fax:  [phone]  Name: Anthony Bullock MRN: [id number] Date of Birth: October 18, 1960

## 2015-03-02 ENCOUNTER — Ambulatory Visit: Payer: BLUE CROSS/BLUE SHIELD | Admitting: Physical Therapy

## 2015-03-02 ENCOUNTER — Encounter: Payer: Self-pay | Admitting: Physical Therapy

## 2015-03-02 DIAGNOSIS — R531 Weakness: Secondary | ICD-10-CM | POA: Diagnosis not present

## 2015-03-02 DIAGNOSIS — M25561 Pain in right knee: Secondary | ICD-10-CM

## 2015-03-02 DIAGNOSIS — M2560 Stiffness of unspecified joint, not elsewhere classified: Secondary | ICD-10-CM

## 2015-03-02 DIAGNOSIS — R5381 Other malaise: Secondary | ICD-10-CM

## 2015-03-02 NOTE — Therapy (Signed)
[facility] [address] [place], [place], [address] Phone: [phone]   Fax:  [phone]  Physical Therapy Treatment  Patient Details  Name: Anthony Bullock MRN: [id number] Date of Birth: 08-31-1960 Referring Provider: Evelena Peat MD  Encounter Date: 03/02/2015      PT End of Session - 03/02/15 1431    Visit Number 7   Number of Visits 16   Date for PT Re-Evaluation 04/07/15   PT Start Time 1405   PT Stop Time 1447   PT Time Calculation (min) 42 min   Activity Tolerance Patient tolerated treatment well   Behavior During Therapy [facility] for tasks assessed/performed      Past Medical History  Diagnosis Date  . NICM (nonischemic cardiomyopathy) (HCC)     Primarily nonischemic. Echo (12/14) with EF 25-30%. Echo (3/15) with EF 25%, mild to moderately dilated LV, normal RV size and systolic function.   . Diabetes mellitus type II   . Hyperlipidemia   . Hypertension   . Urethral stricture   . Kidney stones   . History of cardiac catheterization     a.Myoview 1/15:  There is significant left ventricular dysfunction. There may be slight scar at the apex. There is no significant ischemia. LV Ejection Fraction: 27%  //  b. RHC/LHC (1/15) with mean RA 6, PA 47/22 mean 33, mean PCWP 20, PVR 2.5 WU, CI 2.5; 80% dLAD stenosis, 70% diffuse large D.   . Chronic systolic heart failure (HCC)     a. ECHO (12/2012) EF 25-30%, HK entireanteroseptal myocardium //  b.  EF 25%, diffuse HK, grade 1 diastolic dysfunction, MAC, mild LAE, normal RVSF, trivial pericardial effusion  . Allergic rhinitis   . CKD (chronic kidney disease)   . Diabetic neuropathy (HCC)   . Diabetic nephropathy [facility])     Past Surgical History  Procedure Laterality Date  . Reapea urethral surgery for recurrent obstruction  2011  . Total knee arthroplasty  2007  . Left a nd right heart cath  01/30/2013    DR BENSIHMON  . Cardiac defibrillator placement  06/27/2013    Sub Q        BY DR Graciela Husbands  . Left and right heart catheterization with coronary angiogram N/A 01/30/2013    Procedure: LEFT AND RIGHT HEART CATHETERIZATION WITH CORONARY ANGIOGRAM;  Surgeon: Dolores Patty, MD;  Location: [facility] CATH LAB;  Service: Cardiovascular;  Laterality: N/A;  . Implantable cardioverter defibrillator implant N/A 06/27/2013    Procedure: SUB Q ICD;  Surgeon: Duke Salvia, MD;  Location: [facility] CATH LAB;  Service: Cardiovascular;  Laterality: N/A;  . Bascilic vein transposition Right 08/22/2014    Procedure: RIGHT UPPER ARM BASCILIC VEIN TRANSPOSITION;  Surgeon: Chuck Hint, MD;  Location: [facility] OR;  Service: Vascular;  Laterality: Right;  . Peripheral vascular catheterization Right 01/26/2015    Procedure: A/V Fistulagram;  Surgeon: Chuck Hint, MD;  Location: [facility] INVASIVE CV LAB;  Service: Cardiovascular;  Laterality: Right;    There were no vitals filed for this visit.  Visit Diagnosis:  Weakness  Right knee pain  Stiffness of multiple joints  Debility      Subjective Assessment - 03/02/15 1409    Subjective did good after last treatment, no pain only soreness in right knee   Pertinent History CHF, CKD and diabetic neuropathy, HTN, R TKR 2007, cardiac defibrilator, Fistula R upper arm   Patient Stated Goals want to be able to write, drive and feed  myself with R hand. Need full use of R arm and leg.   Currently in Pain? No/denies            [facility] PT Assessment - 03/02/15 0001    AROM   Right/Left Knee Right   Right Ankle Dorsiflexion --  -5 degrees                     [facility] Adult PT Treatment/Exercise - 03/02/15 0001    Ambulation/Gait   Ambulation/Gait Yes   Ambulation/Gait Assistance 5: Supervision   Ambulation Distance (Feet) 230 Feet   Assistive device Small based quad cane   Gait Pattern Step-through pattern;Decreased arm swing - right;Decreased hip/knee flexion - right;Decreased weight shift to right;Lateral trunk lean to  left;Narrow base of support   Ambulation Surface Level;Indoor   Knee/Hip Exercises: Aerobic   Nustep L5 x15 min UE/LE with monitoring for activity tolerance   Knee/Hip Exercises: Standing   Lateral Step Up Right;2 sets;10 reps;Hand Hold: 2;Step Height: 6"   Forward Step Up Right;2 sets;10 reps;Hand Hold: 2;Step Height: 6"   Rocker Board 4 minutes  calf stretch and balance   Knee/Hip Exercises: Seated   Long Arc Quad Strengthening;Right;3 sets;10 reps   Long Arc Quad Weight 3 lbs.   Knee/Hip Exercises: Supine   Bridges Both;Strengthening;2 sets;10 reps   Knee/Hip Exercises: Sidelying   Hip ADduction Strengthening;Right;10 reps   Shoulder Exercises: Seated   Horizontal ABduction Strengthening;Both;20 reps;Theraband   Theraband Level (Shoulder Horizontal ABduction) Level 1 (Yellow)   External Rotation Strengthening;Both;20 reps;Theraband   Theraband Level (Shoulder External Rotation) Level 1 (Yellow)                PT Education - 03/02/15 1444    Education provided Yes   Education Details HEP   Person(s) Educated Patient   Methods Explanation;Demonstration;Handout   Comprehension Verbalized understanding;Returned demonstration             PT Long Term Goals - 03/02/15 1430    PT LONG TERM GOAL #1   Title I with HEP   Time 4   Period Weeks   Status On-going   PT LONG TERM GOAL #2   Title improved R knee flexion to 90 degrees to improve function   Time 8   Period Weeks   Status On-going   PT LONG TERM GOAL #3   Title decreased pain in RLE to 3/10 with weightbearing   Time 8   Period Weeks   Status On-going   PT LONG TERM GOAL #4   Title demo 4+/5 R hip flexor strength or better to asisst with patient returning to driving.   Time 8   Period Weeks   Status On-going   PT LONG TERM GOAL #5   Title improved R ankle active DF to neutral or better to improve gait.   Time 8   Status On-going               Plan - 03/02/15 1432    Clinical Impression  Statement Patient progressing with all activities. Patient is able to tolerate all exercises with no pain reports. Improved AROM for right DF.Patient reported getting around a lot better and feels more independent overall. patient able to perform gait training with [facility] with little to no cues and supervision only. Unable to meet any further goals due to strength, ROM and pain deficits. Disscussed possible AFO for right LE with MPT today, and sent follow up, will refer to MD.  Pt will benefit from skilled therapeutic intervention in order to improve on the following deficits Difficulty walking;Impaired UE functional use;Decreased endurance;Decreased activity tolerance;Pain;Decreased range of motion;Decreased strength;Increased edema;Decreased mobility;Impaired flexibility;Decreased balance   Rehab Potential Good   PT Frequency 2x / week   PT Duration 8 weeks   PT Treatment/Interventions ADLs/Self Care Home Management;Electrical Stimulation;Cryotherapy;Moist Heat;Ultrasound;Gait training;Therapeutic exercise;Manual techniques;Patient/family education;Neuromuscular re-education;Balance training;Passive range of motion;Dry needling;Vasopneumatic Device   PT Next Visit Plan Continue LE/UE strengthening and gait training with SBQC    Consulted and Agree with Plan of Care Patient        Problem List Patient Active Problem List   Diagnosis Date Noted  . At high risk for falls 09/29/2014  . Peripheral vascular disease (HCC) 09/29/2014  . Osteoarthritis of both knees 01/07/2014  . Osteoarthritis of multiple joints 10/06/2013  . NICM (nonischemic cardiomyopathy) (HCC) 06/27/2013  . CAD (coronary artery disease) 03/13/2013  . Abnormal nuclear stress test 01/27/2013  . Chronic systolic congestive heart failure (HCC) 01/27/2013  . COPD (chronic obstructive pulmonary disease) (HCC) 12/27/2012  . Diabetic peripheral neuropathy (HCC) 02/10/2012  . ANEMIA, MILD 01/18/2010  . Chronic kidney disease (CKD),  stage IV (severe) (HCC) 01/18/2010  . PYELITIS/PYELONEPHRITIS DISEASES CLASSIFIED ELSW 09/28/2009  . URINARY CALCULUS 09/28/2009  . ADJUSTMENT DISORDER WITH DEPRESSED MOOD 08/21/2009  . Poorly controlled type II diabetes mellitus with renal complication (HCC) 04/14/2008  . Hyperlipidemia 04/14/2008  . Essential hypertension 04/14/2008  . ALLERGIC RHINITIS 04/14/2008    Melvenia Favela P, PTA 03/02/2015, 2:52 PM  Cathie Hoops, PTA 03/02/2015 2:52 PM   [facility] [address] [place], [place], [address] Phone: [phone]   Fax:  [phone]  Name: Anthony Bullock MRN: [id number] Date of Birth: 10-Apr-1960    Solon Palm, PT 03/02/2015 3:21 PM [facility] [address] [place], [place], [address] Phone: [phone]   Fax:  [phone]

## 2015-03-02 NOTE — Patient Instructions (Signed)
Dorsiflexion: Resisted    Facing anchor, tubing around left foot, pull toward face.  Repeat __10-20__ times per set. Do _1-3___ sets per session. Do _1-2___ sessions per day.  http://orth.exer.us/8   Copyright  VHI. All rights reserved.

## 2015-03-04 ENCOUNTER — Encounter: Payer: BLUE CROSS/BLUE SHIELD | Admitting: Physical Therapy

## 2015-03-05 ENCOUNTER — Telehealth: Payer: Self-pay | Admitting: Family Medicine

## 2015-03-05 ENCOUNTER — Encounter (HOSPITAL_COMMUNITY): Payer: BLUE CROSS/BLUE SHIELD

## 2015-03-05 DIAGNOSIS — Z736 Limitation of activities due to disability: Secondary | ICD-10-CM

## 2015-03-05 NOTE — Telephone Encounter (Signed)
FYI: pt is scheduled tomorrow at 2:30.

## 2015-03-05 NOTE — Telephone Encounter (Signed)
Pt is being discharge from [facility] hospital today  and per wife needs an appointment tomorrow. Pt was dx with CHF.  Pt wife will ask [facility] fax over discharge summary

## 2015-03-06 ENCOUNTER — Encounter: Payer: Self-pay | Admitting: Family Medicine

## 2015-03-06 ENCOUNTER — Encounter: Payer: BLUE CROSS/BLUE SHIELD | Admitting: Physical Therapy

## 2015-03-06 ENCOUNTER — Ambulatory Visit (INDEPENDENT_AMBULATORY_CARE_PROVIDER_SITE_OTHER): Payer: BLUE CROSS/BLUE SHIELD | Admitting: Family Medicine

## 2015-03-06 VITALS — BP 130/80 | HR 77 | Temp 97.9°F | Ht 71.0 in | Wt 242.2 lb

## 2015-03-06 DIAGNOSIS — E1129 Type 2 diabetes mellitus with other diabetic kidney complication: Secondary | ICD-10-CM

## 2015-03-06 DIAGNOSIS — I5022 Chronic systolic (congestive) heart failure: Secondary | ICD-10-CM | POA: Diagnosis not present

## 2015-03-06 DIAGNOSIS — N184 Chronic kidney disease, stage 4 (severe): Secondary | ICD-10-CM | POA: Diagnosis not present

## 2015-03-06 DIAGNOSIS — E1165 Type 2 diabetes mellitus with hyperglycemia: Secondary | ICD-10-CM

## 2015-03-06 NOTE — Progress Notes (Signed)
Pre visit review using our clinic review tool, if applicable. No additional management support is needed unless otherwise documented below in the visit note. 

## 2015-03-06 NOTE — Patient Instructions (Signed)
DO DAILY WEIGHTS BE IN TOUCH IF WEIGHT INCREASES BY MORE THAN 3 POUNDS IN A DAY OR 5 POUNDS IN A WEEK AVOID EXTRA SODIUM.

## 2015-03-06 NOTE — Progress Notes (Signed)
Subjective:    Patient ID: Anthony Bullock, male    DOB: [birth year], 55 y.o.   MRN: [id number]  HPI Patient here for hospital follow-up. Multiple chronic problems including severe chronic kidney disease, systolic heart failure, COPD, type 2 diabetes with peripheral neuropathy, hypertension, osteoarthritis. This past Tuesday he had increased dyspnea and was taken to [facility] in [place], [place] and was admitted overnight He received IV Lasix and discharged the next day. His weight did not change much. He's not had any significant dyspnea since discharge. Baseline creatinine around 7. Takes Lasix 80 mg twice daily.  He is followed closely by cardiology and nephrology. He is likely looking at dialysis very soon. Has not been doing daily weights. His current scales are apparently not functioning well. He's had some poor compliance with diet recently with eating country ham and bologne.  Wife thinks this may have set off his congestive heart failure. Denies recent fevers or chills. Recent chest x-ray apparently did not show any persistent pneumonia  Past Medical History  Diagnosis Date  . NICM (nonischemic cardiomyopathy) ([place])     Primarily nonischemic. Echo (12/14) with EF 25-30%. Echo (3/15) with EF 25%, mild to moderately dilated LV, normal RV size and systolic function.   . Diabetes mellitus type II   . Hyperlipidemia   . Hypertension   . Urethral stricture   . Kidney stones   . History of cardiac catheterization     a.Myoview 1/15:  There is significant left ventricular dysfunction. There may be slight scar at the apex. There is no significant ischemia. LV Ejection Fraction: 27%  //  b. RHC/LHC (1/15) with mean RA 6, PA 47/22 mean 33, mean PCWP 20, PVR 2.5 WU, CI 2.5; 80% dLAD stenosis, 70% diffuse large D.   . Chronic systolic heart failure ([place])     a. ECHO (12/2012) EF 25-30%, HK entireanteroseptal myocardium //  b.  EF 25%, diffuse HK, grade 1 diastolic dysfunction,  MAC, mild LAE, normal RVSF, trivial pericardial effusion  . Allergic rhinitis   . CKD (chronic kidney disease)   . Diabetic neuropathy ([place])   . Diabetic nephropathy [facility])    Past Surgical History  Procedure Laterality Date  . Reapea urethral surgery for recurrent obstruction  2011  . Total knee arthroplasty  2007  . Left a nd right heart cath  01/30/2013    DR BENSIHMON  . Cardiac defibrillator placement  06/27/2013    Sub Q       BY DR Caryl Comes  . Left and right heart catheterization with coronary angiogram N/A 01/30/2013    Procedure: LEFT AND RIGHT HEART CATHETERIZATION WITH CORONARY ANGIOGRAM;  Surgeon: Jolaine Artist, MD;  Location: [facility] CATH LAB;  Service: Cardiovascular;  Laterality: N/A;  . Implantable cardioverter defibrillator implant N/A 06/27/2013    Procedure: SUB Q ICD;  Surgeon: Deboraha Sprang, MD;  Location: [facility] CATH LAB;  Service: Cardiovascular;  Laterality: N/A;  . Bascilic vein transposition Right 08/22/2014    Procedure: RIGHT UPPER ARM [place];  Surgeon: Angelia Mould, MD;  Location: [place];  Service: Vascular;  Laterality: Right;  . Peripheral vascular catheterization Right 01/26/2015    Procedure: A/V Fistulagram;  Surgeon: Angelia Mould, MD;  Location: [place] CV LAB;  Service: Cardiovascular;  Laterality: Right;    reports that he quit smoking about 4 years ago. His smoking use included Cigarettes. He has a 64 pack-year smoking history. He has never used smokeless tobacco.  He reports that he does not drink alcohol or use illicit drugs. family history includes Alcohol abuse in his father; Cancer in his father and mother; Diabetes in his other; Heart Problems in his maternal grandmother; Heart disease in his maternal grandfather; Prostate cancer in his maternal grandfather; Stroke in his maternal grandmother. Allergies  Allergen Reactions  . Morphine Sulfate Rash    REACTION: Itches all over, red spots       Review of Systems    Constitutional: Negative for fatigue and unexpected weight change.  Eyes: Negative for visual disturbance.  Respiratory: Negative for cough, chest tightness and shortness of breath.   Cardiovascular: Positive for leg swelling. Negative for chest pain and palpitations.  Neurological: Negative for dizziness, syncope, weakness, light-headedness and headaches.       Objective:   Physical Exam  Constitutional: He is oriented to person, place, and time. He appears well-developed and well-nourished.  HENT:  Right Ear: External ear normal.  Left Ear: External ear normal.  Mouth/Throat: Oropharynx is clear and moist.  Eyes: Pupils are equal, round, and reactive to light.  Neck: Neck supple. No thyromegaly present.  Cardiovascular: Normal rate and regular rhythm.   Pulmonary/Chest: Effort normal and breath sounds normal. No respiratory distress. He has no wheezes. He has no rales.  Musculoskeletal: He exhibits edema.  Support hose on both legs.  Neurological: He is alert and oriented to person, place, and time.          Assessment & Plan:  #1 recent CHF exacerbation. He has known severe systolic heart failure. Recent poor compliance with diet.   He is strongly encouraged to watch sodium intake. Elevate legs frequently. Continue Lasix 80 mg twice daily. Monitor daily weights. Be in touch if weight goes  Up 3 pounds in 1 day or 5 pounds one week. Consider increasing furosemide to 120 mg twice daily for weight gain above  #2 chronic kidney disease. Current creatinine around 7. He is encouraged to keep close follow-up with nephrology.  #3 type 2 diabetes. History of very poor control. History of poor compliance. Recent CBGs relatively stable on insulin regimen.

## 2015-03-09 ENCOUNTER — Ambulatory Visit: Payer: BLUE CROSS/BLUE SHIELD | Admitting: Physical Therapy

## 2015-03-09 ENCOUNTER — Encounter: Payer: Self-pay | Admitting: Physical Therapy

## 2015-03-09 DIAGNOSIS — M25561 Pain in right knee: Secondary | ICD-10-CM

## 2015-03-09 DIAGNOSIS — R5381 Other malaise: Secondary | ICD-10-CM

## 2015-03-09 DIAGNOSIS — R531 Weakness: Secondary | ICD-10-CM | POA: Diagnosis not present

## 2015-03-09 DIAGNOSIS — M2560 Stiffness of unspecified joint, not elsewhere classified: Secondary | ICD-10-CM

## 2015-03-09 NOTE — Therapy (Signed)
[facility] [place], [place], [address] Phone: [phone]   Fax:  [phone]  Physical Therapy Treatment  Patient Details  Name: Anthony Bullock MRN: [id number] Date of Birth: Nov 22, 1960 Referring Provider: Carolann Littler MD  Encounter Date: 03/09/2015      PT End of Session - 03/09/15 1406    Visit Number 8   Number of Visits 16   Date for PT Re-Evaluation 04/07/15   PT Start Time 1316   PT Stop Time 1401   PT Time Calculation (min) 45 min   Activity Tolerance Patient tolerated treatment well   Behavior During Therapy [facility] for tasks assessed/performed      Past Medical History  Diagnosis Date  . NICM (nonischemic cardiomyopathy) ([place])     Primarily nonischemic. Echo (12/14) with EF 25-30%. Echo (3/15) with EF 25%, mild to moderately dilated LV, normal RV size and systolic function.   . Diabetes mellitus type II   . Hyperlipidemia   . Hypertension   . Urethral stricture   . Kidney stones   . History of cardiac catheterization     a.Myoview 1/15:  There is significant left ventricular dysfunction. There may be slight scar at the apex. There is no significant ischemia. LV Ejection Fraction: 27%  //  b. RHC/LHC (1/15) with mean RA 6, PA 47/22 mean 33, mean PCWP 20, PVR 2.5 WU, CI 2.5; 80% dLAD stenosis, 70% diffuse large D.   . Chronic systolic heart failure ([place])     a. ECHO (12/2012) EF 25-30%, HK entireanteroseptal myocardium //  b.  EF 25%, diffuse HK, grade 1 diastolic dysfunction, MAC, mild LAE, normal RVSF, trivial pericardial effusion  . Allergic rhinitis   . CKD (chronic kidney disease)   . Diabetic neuropathy ([place])   . Diabetic nephropathy [facility])     Past Surgical History  Procedure Laterality Date  . Reapea urethral surgery for recurrent obstruction  2011  . Total knee arthroplasty  2007  . Left a nd right heart cath  01/30/2013    DR BENSIHMON  . Cardiac defibrillator placement  06/27/2013    Sub Q        BY DR Caryl Comes  . Left and right heart catheterization with coronary angiogram N/A 01/30/2013    Procedure: LEFT AND RIGHT HEART CATHETERIZATION WITH CORONARY ANGIOGRAM;  Surgeon: Jolaine Artist, MD;  Location: [facility] CATH LAB;  Service: Cardiovascular;  Laterality: N/A;  . Implantable cardioverter defibrillator implant N/A 06/27/2013    Procedure: SUB Q ICD;  Surgeon: Deboraha Sprang, MD;  Location: [facility] CATH LAB;  Service: Cardiovascular;  Laterality: N/A;  . Bascilic vein transposition Right 08/22/2014    Procedure: RIGHT UPPER ARM [place];  Surgeon: Angelia Mould, MD;  Location: [place];  Service: Vascular;  Laterality: Right;  . Peripheral vascular catheterization Right 01/26/2015    Procedure: A/V Fistulagram;  Surgeon: Angelia Mould, MD;  Location: [place] CV LAB;  Service: Cardiovascular;  Laterality: Right;    There were no vitals filed for this visit.  Visit Diagnosis:  Weakness  Right knee pain  Stiffness of multiple joints  Debility      Subjective Assessment - 03/09/15 1322    Subjective Some weakness today per patient after being in hospital with an episode from CHF    Pertinent History CHF, CKD and diabetic neuropathy, HTN, R TKR 2007, cardiac defibrilator, Fistula R upper arm   Patient Stated Goals want to be able to write,  drive and feed myself with R hand. Need full use of R arm and leg.   Currently in Pain? No/denies                         [facility] Adult PT Treatment/Exercise - 03/09/15 0001    Exercises   Exercises Ankle   Knee/Hip Exercises: Aerobic   Nustep L5 x15 min UE/LE with monitoring for activity tolerance   Knee/Hip Exercises: Machines for Strengthening   Cybex Knee Flexion 20# 3x10   Knee/Hip Exercises: Standing   Lateral Step Up Right;2 sets;10 reps;Hand Hold: 2;Step Height: 8"   Forward Step Up Right;2 sets;10 reps;Hand Hold: 2;Step Height: 6"   Rocker Board 4 minutes  balance/stretch   Other  Standing Knee Exercises TKE with pink XTS 3x10   Knee/Hip Exercises: Supine   Bridges Both;Strengthening;2 sets;10 reps   Knee/Hip Exercises: Sidelying   Hip ADduction Strengthening;Right;10 reps   Ankle Exercises: Supine   T-Band yellow band DF 3x10                     PT Long Term Goals - 03/09/15 1356    PT LONG TERM GOAL #1   Title I with HEP   Time 4   Period Weeks   Status On-going   PT LONG TERM GOAL #2   Title improved R knee flexion to 90 degrees to improve function   Time 8   Period Weeks   Status On-going  AROM 68 degrees   PT LONG TERM GOAL #3   Title decreased pain in RLE to 3/10 with weightbearing   Time 8   Period Weeks   Status On-going  4/10 pain (03/09/15)   PT LONG TERM GOAL #4   Title demo 4+/5 R hip flexor strength or better to asisst with patient returning to driving.   Time 8   Period Weeks   Status On-going   PT LONG TERM GOAL #5   Title improved R ankle active DF to neutral or better to improve gait.   Time 8   Period Weeks   Status On-going   PT LONG TERM GOAL #6   Title Patient to report no falls or giving out of knee in 4 weeks previous.   Time 8   Period Weeks   Status Achieved               Plan - 03/09/15 1402    Clinical Impression Statement Patient tolerated treatment very well today with no fatigue or difficulty. Patient able to progress with strength exercises and feels like he can perform standing and weigh bearing activities for longer with less pain overall to no more than 4/10. Patient progressing yet unable to meet any further goals due to pain, strength and ROM deficits.   Pt will benefit from skilled therapeutic intervention in order to improve on the following deficits Difficulty walking;Impaired UE functional use;Decreased endurance;Decreased activity tolerance;Pain;Decreased range of motion;Decreased strength;Increased edema;Decreased mobility;Impaired flexibility;Decreased balance   PT Frequency 2x /  week   PT Duration 8 weeks   PT Treatment/Interventions ADLs/Self Care Home Management;Electrical Stimulation;Cryotherapy;Moist Heat;Ultrasound;Gait training;Therapeutic exercise;Manual techniques;Patient/family education;Neuromuscular re-education;Balance training;Passive range of motion;Dry needling;Vasopneumatic Device   PT Next Visit Plan Continue LE/UE strengthening    Consulted and Agree with Plan of Care Patient        Problem List Patient Active Problem List   Diagnosis Date Noted  . At high risk for falls 09/29/2014  .  Peripheral vascular disease ([place]) 09/29/2014  . Osteoarthritis of both knees 01/07/2014  . Osteoarthritis of multiple joints 10/06/2013  . NICM (nonischemic cardiomyopathy) ([place]) 06/27/2013  . CAD (coronary artery disease) 03/13/2013  . Abnormal nuclear stress test 01/27/2013  . Chronic systolic congestive heart failure ([place]) 01/27/2013  . COPD (chronic obstructive pulmonary disease) ([place]) 12/27/2012  . Diabetic peripheral neuropathy ([place]) 02/10/2012  . ANEMIA, MILD 01/18/2010  . Chronic kidney disease (CKD), stage IV (severe) ([place]) 01/18/2010  . PYELITIS/PYELONEPHRITIS DISEASES CLASSIFIED ELSW 09/28/2009  . URINARY CALCULUS 09/28/2009  . ADJUSTMENT DISORDER WITH DEPRESSED MOOD 08/21/2009  . Poorly controlled type II diabetes mellitus with renal complication ([place]) [id number]  . Hyperlipidemia 04/14/2008  . Essential hypertension 04/14/2008  . ALLERGIC RHINITIS 04/14/2008    Phillips Climes, PTA 03/09/2015, 2:07 PM  [facility] [place], [place], [address] Phone: [phone]   Fax:  [phone]  Name: AKRAM MOBBS MRN: [id number] Date of Birth: August 17, 1960

## 2015-03-11 ENCOUNTER — Encounter: Payer: Self-pay | Admitting: Physical Therapy

## 2015-03-11 ENCOUNTER — Ambulatory Visit: Payer: BLUE CROSS/BLUE SHIELD | Attending: Family Medicine | Admitting: Physical Therapy

## 2015-03-11 DIAGNOSIS — R5381 Other malaise: Secondary | ICD-10-CM | POA: Diagnosis present

## 2015-03-11 DIAGNOSIS — M25561 Pain in right knee: Secondary | ICD-10-CM | POA: Diagnosis present

## 2015-03-11 DIAGNOSIS — R531 Weakness: Secondary | ICD-10-CM | POA: Diagnosis not present

## 2015-03-11 DIAGNOSIS — M2560 Stiffness of unspecified joint, not elsewhere classified: Secondary | ICD-10-CM | POA: Insufficient documentation

## 2015-03-11 NOTE — Therapy (Signed)
[facility] [place], [place], [address] Phone: [phone]   Fax:  [phone]  Physical Therapy Treatment  Patient Details  Name: Anthony Bullock MRN: [id number] Date of Birth: 1960-07-16 Referring Provider: Carolann Littler MD  Encounter Date: 03/11/2015      PT End of Session - 03/11/15 1352    Visit Number 9   Number of Visits 16   Date for PT Re-Evaluation 04/07/15   PT Start Time 1314   PT Stop Time 1354   PT Time Calculation (min) 40 min   Activity Tolerance Patient tolerated treatment well   Behavior During Therapy [facility] for tasks assessed/performed      Past Medical History  Diagnosis Date  . NICM (nonischemic cardiomyopathy) ([place])     Primarily nonischemic. Echo (12/14) with EF 25-30%. Echo (3/15) with EF 25%, mild to moderately dilated LV, normal RV size and systolic function.   . Diabetes mellitus type II   . Hyperlipidemia   . Hypertension   . Urethral stricture   . Kidney stones   . History of cardiac catheterization     a.Myoview 1/15:  There is significant left ventricular dysfunction. There may be slight scar at the apex. There is no significant ischemia. LV Ejection Fraction: 27%  //  b. RHC/LHC (1/15) with mean RA 6, PA 47/22 mean 33, mean PCWP 20, PVR 2.5 WU, CI 2.5; 80% dLAD stenosis, 70% diffuse large D.   . Chronic systolic heart failure ([place])     a. ECHO (12/2012) EF 25-30%, HK entireanteroseptal myocardium //  b.  EF 25%, diffuse HK, grade 1 diastolic dysfunction, MAC, mild LAE, normal RVSF, trivial pericardial effusion  . Allergic rhinitis   . CKD (chronic kidney disease)   . Diabetic neuropathy ([place])   . Diabetic nephropathy [facility])     Past Surgical History  Procedure Laterality Date  . Reapea urethral surgery for recurrent obstruction  2011  . Total knee arthroplasty  2007  . Left a nd right heart cath  01/30/2013    DR BENSIHMON  . Cardiac defibrillator placement  06/27/2013    Sub Q        BY DR Caryl Comes  . Left and right heart catheterization with coronary angiogram N/A 01/30/2013    Procedure: LEFT AND RIGHT HEART CATHETERIZATION WITH CORONARY ANGIOGRAM;  Surgeon: Jolaine Artist, MD;  Location: [facility] CATH LAB;  Service: Cardiovascular;  Laterality: N/A;  . Implantable cardioverter defibrillator implant N/A 06/27/2013    Procedure: SUB Q ICD;  Surgeon: Deboraha Sprang, MD;  Location: [facility] CATH LAB;  Service: Cardiovascular;  Laterality: N/A;  . Bascilic vein transposition Right 08/22/2014    Procedure: RIGHT UPPER ARM [place];  Surgeon: Angelia Mould, MD;  Location: [place];  Service: Vascular;  Laterality: Right;  . Peripheral vascular catheterization Right 01/26/2015    Procedure: A/V Fistulagram;  Surgeon: Angelia Mould, MD;  Location: [place] CV LAB;  Service: Cardiovascular;  Laterality: Right;    There were no vitals filed for this visit.  Visit Diagnosis:  Weakness  Right knee pain  Stiffness of multiple joints  Debility      Subjective Assessment - 03/11/15 1322    Subjective A little sore from doing a lot of walking at home   Pertinent History CHF, CKD and diabetic neuropathy, HTN, R TKR 2007, cardiac defibrilator, Fistula R upper arm   Patient Stated Goals want to be able to write, drive and feed myself  with R hand. Need full use of R arm and leg.   Currently in Pain? No/denies                         [facility] Adult PT Treatment/Exercise - 03/11/15 0001    Knee/Hip Exercises: Aerobic   Nustep L5 x15 min UE/LE with monitoring for activity tolerance   Knee/Hip Exercises: Machines for Strengthening   Cybex Knee Flexion 20# 3x10   Knee/Hip Exercises: Standing   Rocker Board 4 minutes   Other Standing Knee Exercises TKE with pink XTS 3x10   Knee/Hip Exercises: Supine   Bridges Both;Strengthening;2 sets;10 reps   Ankle Exercises: Supine   T-Band yellow band DF 3x10                     PT Long  Term Goals - 03/09/15 1356    PT LONG TERM GOAL #1   Title I with HEP   Time 4   Period Weeks   Status On-going   PT LONG TERM GOAL #2   Title improved R knee flexion to 90 degrees to improve function   Time 8   Period Weeks   Status On-going  AROM 68 degrees   PT LONG TERM GOAL #3   Title decreased pain in RLE to 3/10 with weightbearing   Time 8   Period Weeks   Status On-going  4/10 pain (03/09/15)   PT LONG TERM GOAL #4   Title demo 4+/5 R hip flexor strength or better to asisst with patient returning to driving.   Time 8   Period Weeks   Status On-going   PT LONG TERM GOAL #5   Title improved R ankle active DF to neutral or better to improve gait.   Time 8   Period Weeks   Status On-going   PT LONG TERM GOAL #6   Title Patient to report no falls or giving out of knee in 4 weeks previous.   Time 8   Period Weeks   Status Achieved               Plan - 03/11/15 1353    Clinical Impression Statement Patient tolerated treatment well, yet some fatigue today. Patient has reported becoming more independent with [facility] and has had no falls. Patient unable to meet any further goals due to ROM and strength deficits.   Pt will benefit from skilled therapeutic intervention in order to improve on the following deficits Difficulty walking;Impaired UE functional use;Decreased endurance;Decreased activity tolerance;Pain;Decreased range of motion;Decreased strength;Increased edema;Decreased mobility;Impaired flexibility;Decreased balance   Rehab Potential Good   PT Frequency 2x / week   PT Duration 8 weeks   PT Treatment/Interventions ADLs/Self Care Home Management;Electrical Stimulation;Cryotherapy;Moist Heat;Ultrasound;Gait training;Therapeutic exercise;Manual techniques;Patient/family education;Neuromuscular re-education;Balance training;Passive range of motion;Dry needling;Vasopneumatic Device   PT Next Visit Plan Continue LE/UE strengthening    Consulted and Agree with Plan of  Care Patient        Problem List Patient Active Problem List   Diagnosis Date Noted  . At high risk for falls 09/29/2014  . Peripheral vascular disease ([place]) 09/29/2014  . Osteoarthritis of both knees 01/07/2014  . Osteoarthritis of multiple joints 10/06/2013  . NICM (nonischemic cardiomyopathy) ([place]) 06/27/2013  . CAD (coronary artery disease) 03/13/2013  . Abnormal nuclear stress test 01/27/2013  . Chronic systolic congestive heart failure ([place]) 01/27/2013  . COPD (chronic obstructive pulmonary disease) ([place]) 12/27/2012  . Diabetic peripheral neuropathy ([place]) 02/10/2012  .  ANEMIA, MILD 01/18/2010  . Chronic kidney disease (CKD), stage IV (severe) ([place]) 01/18/2010  . PYELITIS/PYELONEPHRITIS DISEASES CLASSIFIED ELSW 09/28/2009  . URINARY CALCULUS 09/28/2009  . ADJUSTMENT DISORDER WITH DEPRESSED MOOD 08/21/2009  . Poorly controlled type II diabetes mellitus with renal complication ([place]) [id number]  . Hyperlipidemia 04/14/2008  . Essential hypertension 04/14/2008  . ALLERGIC RHINITIS 04/14/2008    Phillips Climes, PTA 03/11/2015, 1:56 PM  [facility] [address] [place], [place], [address] Phone: [phone]   Fax:  [phone]  Name: MCKINNEY POPLAR MRN: [id number] Date of Birth: 1960-10-31

## 2015-03-12 ENCOUNTER — Encounter (HOSPITAL_COMMUNITY)
Admission: RE | Admit: 2015-03-12 | Discharge: 2015-03-12 | Disposition: A | Discharge status: Home / Self Care | Payer: BLUE CROSS/BLUE SHIELD | Source: Ambulatory Visit | Attending: Nephrology | Admitting: Nephrology

## 2015-03-12 DIAGNOSIS — Z5181 Encounter for therapeutic drug level monitoring: Secondary | ICD-10-CM | POA: Diagnosis not present

## 2015-03-12 DIAGNOSIS — Z95810 Presence of automatic (implantable) cardiac defibrillator: Secondary | ICD-10-CM | POA: Insufficient documentation

## 2015-03-12 DIAGNOSIS — N183 Chronic kidney disease, stage 3 (moderate): Secondary | ICD-10-CM | POA: Diagnosis present

## 2015-03-12 DIAGNOSIS — Z79899 Other long term (current) drug therapy: Secondary | ICD-10-CM | POA: Diagnosis not present

## 2015-03-12 DIAGNOSIS — D631 Anemia in chronic kidney disease: Secondary | ICD-10-CM | POA: Insufficient documentation

## 2015-03-12 DIAGNOSIS — D509 Iron deficiency anemia, unspecified: Secondary | ICD-10-CM | POA: Diagnosis not present

## 2015-03-12 LAB — POCT HEMOGLOBIN-HEMACUE: Hemoglobin: 9.1 g/dL — ABNORMAL LOW (ref 13.0–17.0)

## 2015-03-12 MED ORDER — DARBEPOETIN ALFA 100 MCG/0.5ML IJ SOSY
PREFILLED_SYRINGE | INTRAMUSCULAR | Status: AC
  Administered 2015-03-12: 100 ug via SUBCUTANEOUS
  Filled 2015-03-12: qty 0.5

## 2015-03-12 MED ORDER — DARBEPOETIN ALFA 100 MCG/0.5ML IJ SOSY: 100.0000 ug | PREFILLED_SYRINGE | INTRAMUSCULAR | Status: DC

## 2015-03-13 ENCOUNTER — Ambulatory Visit: Payer: BLUE CROSS/BLUE SHIELD | Admitting: Family Medicine

## 2015-03-13 ENCOUNTER — Ambulatory Visit: Payer: BLUE CROSS/BLUE SHIELD | Admitting: *Deleted

## 2015-03-13 DIAGNOSIS — R531 Weakness: Secondary | ICD-10-CM

## 2015-03-13 DIAGNOSIS — R5381 Other malaise: Secondary | ICD-10-CM

## 2015-03-13 DIAGNOSIS — M2560 Stiffness of unspecified joint, not elsewhere classified: Secondary | ICD-10-CM

## 2015-03-13 DIAGNOSIS — M25561 Pain in right knee: Secondary | ICD-10-CM

## 2015-03-13 LAB — PTH, INTACT AND CALCIUM
Calcium, Total (PTH): 7.4 mg/dL — ABNORMAL LOW (ref 8.7–10.2)
PTH: 138 pg/mL — ABNORMAL HIGH (ref 15–65)

## 2015-03-13 NOTE — Therapy (Signed)
[facility] [place], [place], [address] Phone: [phone]   Fax:  [phone]  Physical Therapy Treatment  Patient Details  Name: OBED METZEN MRN: [id number] Date of Birth: 1960/04/28 Referring Provider: Carolann Littler MD  Encounter Date: 03/13/2015      PT End of Session - 03/13/15 1128    Visit Number 10   Number of Visits 16   Date for PT Re-Evaluation 04/07/15   PT Start Time 1119   PT Stop Time 1204   PT Time Calculation (min) 45 min      Past Medical History  Diagnosis Date  . NICM (nonischemic cardiomyopathy) ([place])     Primarily nonischemic. Echo (12/14) with EF 25-30%. Echo (3/15) with EF 25%, mild to moderately dilated LV, normal RV size and systolic function.   . Diabetes mellitus type II   . Hyperlipidemia   . Hypertension   . Urethral stricture   . Kidney stones   . History of cardiac catheterization     a.Myoview 1/15:  There is significant left ventricular dysfunction. There may be slight scar at the apex. There is no significant ischemia. LV Ejection Fraction: 27%  //  b. RHC/LHC (1/15) with mean RA 6, PA 47/22 mean 33, mean PCWP 20, PVR 2.5 WU, CI 2.5; 80% dLAD stenosis, 70% diffuse large D.   . Chronic systolic heart failure ([place])     a. ECHO (12/2012) EF 25-30%, HK entireanteroseptal myocardium //  b.  EF 25%, diffuse HK, grade 1 diastolic dysfunction, MAC, mild LAE, normal RVSF, trivial pericardial effusion  . Allergic rhinitis   . CKD (chronic kidney disease)   . Diabetic neuropathy ([place])   . Diabetic nephropathy [facility])     Past Surgical History  Procedure Laterality Date  . Reapea urethral surgery for recurrent obstruction  2011  . Total knee arthroplasty  2007  . Left a nd right heart cath  01/30/2013    DR BENSIHMON  . Cardiac defibrillator placement  06/27/2013    Sub Q       BY DR Caryl Comes  . Left and right heart catheterization with coronary angiogram N/A 01/30/2013    Procedure:  LEFT AND RIGHT HEART CATHETERIZATION WITH CORONARY ANGIOGRAM;  Surgeon: Jolaine Artist, MD;  Location: [facility] CATH LAB;  Service: Cardiovascular;  Laterality: N/A;  . Implantable cardioverter defibrillator implant N/A 06/27/2013    Procedure: SUB Q ICD;  Surgeon: Deboraha Sprang, MD;  Location: [facility] CATH LAB;  Service: Cardiovascular;  Laterality: N/A;  . Bascilic vein transposition Right 08/22/2014    Procedure: RIGHT UPPER ARM [place];  Surgeon: Angelia Mould, MD;  Location: [place];  Service: Vascular;  Laterality: Right;  . Peripheral vascular catheterization Right 01/26/2015    Procedure: A/V Fistulagram;  Surgeon: Angelia Mould, MD;  Location: [place] CV LAB;  Service: Cardiovascular;  Laterality: Right;    There were no vitals filed for this visit.  Visit Diagnosis:  Weakness  Right knee pain  Stiffness of multiple joints  Debility      Subjective Assessment - 03/13/15 1128    Subjective A little sore from doing a lot of walking at home. Walking with [facility] today and most of the time now   Pertinent History CHF, CKD and diabetic neuropathy, HTN, R TKR 2007, cardiac defibrilator, Fistula R upper arm   Patient Stated Goals want to be able to write, drive and feed myself with R hand. Need full use of  R arm and leg.   Currently in Pain? Yes   Pain Score 5    Pain Location Knee   Pain Orientation Right   Pain Descriptors / Indicators Sore   Pain Type Chronic pain   Pain Onset More than a month ago   Pain Frequency Constant   Aggravating Factors  standing too long   Pain Relieving Factors rest            [facility] PT Assessment - 03/13/15 0001    AROM   Right/Left Knee Right   Right Knee Flexion 75   Right Ankle Dorsiflexion 0                     [facility] Adult PT Treatment/Exercise - 03/13/15 0001    Ambulation/Gait   Ambulation/Gait Yes   Ambulation/Gait Assistance 5: Supervision   Ambulation Distance (Feet) 160 Feet    Assistive device Small based quad cane   Gait Pattern Step-through pattern;Decreased arm swing - right;Decreased hip/knee flexion - right;Decreased weight shift to right;Lateral trunk lean to left;Narrow base of support   Ambulation Surface Level   Exercises   Exercises Ankle   Knee/Hip Exercises: Aerobic   Nustep L5 x15 min UE/LE with monitoring for activity tolerance  LE's only as tolerated   Knee/Hip Exercises: Machines for Strengthening   Cybex Knee Flexion --   Knee/Hip Exercises: Standing   Forward Step Up Right;2 sets;10 reps;Hand Hold: 2;Step Height: 6"   Rocker Board 5 minutes  calf stretching and balance   Ankle Exercises: Standing   Other Standing Ankle Exercises Dyna disc EV/INV,PF/DF, circles CW/CCW all 2x10 each way                     PT Long Term Goals - 03/13/15 1148    PT LONG TERM GOAL #1   Title I with HEP   Time 4   Period Weeks   Status On-going   PT LONG TERM GOAL #2   Title improved R knee flexion to 90 degrees to improve function   Time 8   Period Weeks   Status On-going   PT LONG TERM GOAL #3   Title decreased pain in RLE to 3/10 with weightbearing   Time 8   Period Weeks   Status On-going   PT LONG TERM GOAL #4   Title demo 4+/5 R hip flexor strength or better to asisst with patient returning to driving.   Time 8   Period Weeks   Status On-going   PT LONG TERM GOAL #5   Title improved R ankle active DF to neutral or better to improve gait.   Time 8   Period Weeks   Status Achieved               Plan - 03/13/15 1147    Clinical Impression Statement Pt did great today and is walking more frequently with [facility] now and is able to walk longer than before. He was able to progree ROM for his RT ankle and knee today and  meet LTG #5 for DF to neutral, but was unable to meet LTG for knee flexion due to tightness. Pt still feels that he still needs an AFO fo RT ankle  to help during gait.   Pt will benefit from skilled therapeutic  intervention in order to improve on the following deficits Difficulty walking;Impaired UE functional use;Decreased endurance;Decreased activity tolerance;Pain;Decreased range of motion;Decreased strength;Increased edema;Decreased mobility;Impaired flexibility;Decreased balance   Rehab  Potential Good   PT Frequency 2x / week   PT Duration 8 weeks   PT Next Visit Plan Continue LE/UE strengthening    MD progress note.  MD script for AFO?   PT Home Exercise Plan bridging, BUE flexion stretch, supine hip flexion; yellow putty gripping for B hands   Consulted and Agree with Plan of Care Patient        Problem List Patient Active Problem List   Diagnosis Date Noted  . At high risk for falls 09/29/2014  . Peripheral vascular disease ([place]) 09/29/2014  . Osteoarthritis of both knees 01/07/2014  . Osteoarthritis of multiple joints 10/06/2013  . NICM (nonischemic cardiomyopathy) ([place]) 06/27/2013  . CAD (coronary artery disease) 03/13/2013  . Abnormal nuclear stress test 01/27/2013  . Chronic systolic congestive heart failure ([place]) 01/27/2013  . COPD (chronic obstructive pulmonary disease) ([place]) 12/27/2012  . Diabetic peripheral neuropathy ([place]) 02/10/2012  . ANEMIA, MILD 01/18/2010  . Chronic kidney disease (CKD), stage IV (severe) ([place]) 01/18/2010  . PYELITIS/PYELONEPHRITIS DISEASES CLASSIFIED ELSW 09/28/2009  . URINARY CALCULUS 09/28/2009  . ADJUSTMENT DISORDER WITH DEPRESSED MOOD 08/21/2009  . Poorly controlled type II diabetes mellitus with renal complication ([place]) [id number]  . Hyperlipidemia 04/14/2008  . Essential hypertension 04/14/2008  . ALLERGIC RHINITIS 04/14/2008    RAMSEUR,CHRIS, PTA 03/13/2015, 12:18 PM  [facility] [address] [place], [place], [address] Phone: [phone]   Fax:  [phone]  Name: QUENDARIUS BURROUS MRN: [id number] Date of Birth: 08-11-1960

## 2015-03-16 ENCOUNTER — Encounter: Payer: BLUE CROSS/BLUE SHIELD | Admitting: Physical Therapy

## 2015-03-17 LAB — HM DIABETES EYE EXAM

## 2015-03-18 ENCOUNTER — Encounter: Payer: Self-pay | Admitting: Physical Therapy

## 2015-03-18 ENCOUNTER — Ambulatory Visit: Payer: BLUE CROSS/BLUE SHIELD | Admitting: Physical Therapy

## 2015-03-18 DIAGNOSIS — R531 Weakness: Secondary | ICD-10-CM | POA: Diagnosis not present

## 2015-03-18 DIAGNOSIS — R5381 Other malaise: Secondary | ICD-10-CM

## 2015-03-18 DIAGNOSIS — M25561 Pain in right knee: Secondary | ICD-10-CM

## 2015-03-18 DIAGNOSIS — M2560 Stiffness of unspecified joint, not elsewhere classified: Secondary | ICD-10-CM

## 2015-03-18 NOTE — Therapy (Signed)
[facility] [place], [place], [address] Phone: [phone]   Fax:  [phone]  Physical Therapy Treatment  Patient Details  Name: Anthony Bullock MRN: [id number] Date of Birth: [birth year] Referring Provider: Carolann Littler MD  Encounter Date: 03/18/2015      PT End of Session - 03/18/15 1344    Visit Number 11   Number of Visits 16   Date for PT Re-Evaluation 04/07/15   PT Start Time 1315   PT Stop Time 1358   PT Time Calculation (min) 43 min   Activity Tolerance Patient tolerated treatment well   Behavior During Therapy [facility] for tasks assessed/performed      Past Medical History  Diagnosis Date  . NICM (nonischemic cardiomyopathy) ([place])     Primarily nonischemic. Echo (12/14) with EF 25-30%. Echo (3/15) with EF 25%, mild to moderately dilated LV, normal RV size and systolic function.   . Diabetes mellitus type II   . Hyperlipidemia   . Hypertension   . Urethral stricture   . Kidney stones   . History of cardiac catheterization     a.Myoview 1/15:  There is significant left ventricular dysfunction. There may be slight scar at the apex. There is no significant ischemia. LV Ejection Fraction: 27%  //  b. RHC/LHC (1/15) with mean RA 6, PA 47/22 mean 33, mean PCWP 20, PVR 2.5 WU, CI 2.5; 80% dLAD stenosis, 70% diffuse large D.   . Chronic systolic heart failure ([place])     a. ECHO (12/2012) EF 25-30%, HK entireanteroseptal myocardium //  b.  EF 25%, diffuse HK, grade 1 diastolic dysfunction, MAC, mild LAE, normal RVSF, trivial pericardial effusion  . Allergic rhinitis   . CKD (chronic kidney disease)   . Diabetic neuropathy ([place])   . Diabetic nephropathy [facility])     Past Surgical History  Procedure Laterality Date  . Reapea urethral surgery for recurrent obstruction  2011  . Total knee arthroplasty  2007  . Left a nd right heart cath  01/30/2013    DR BENSIHMON  . Cardiac defibrillator placement  06/27/2013    Sub Q        BY DR Caryl Comes  . Left and right heart catheterization with coronary angiogram N/A 01/30/2013    Procedure: LEFT AND RIGHT HEART CATHETERIZATION WITH CORONARY ANGIOGRAM;  Surgeon: Jolaine Artist, MD;  Location[facility] CATH LAB;  Service: Cardiovascular;  Laterality: N/A;  . Implantable cardioverter defibrillator implant N/A 06/27/2013    Procedure: SUB Q ICD;  Surgeon: Deboraha Sprang, MD;  Location: [facility] CATH LAB;  Service: Cardiovascular;  Laterality: N/A;  . Bascilic vein transposition Right 08/22/2014    Procedure: RIGHT UPPER ARM [place];  Surgeon: Angelia Mould, MD;  Location: [place];  Service: Vascular;  Laterality: Right;  . Peripheral vascular catheterization Right 01/26/2015    Procedure: A/V Fistulagram;  Surgeon: Angelia Mould, MD;  Location: [place] CV LAB;  Service: Cardiovascular;  Laterality: Right;    There were no vitals filed for this visit.  Visit Diagnosis:  Weakness  Right knee pain  Stiffness of multiple joints  Debility      Subjective Assessment - 03/18/15 1317    Subjective Patient reported he was able to walk around [employer] with a cane for the first time in 6 months and able to tolerate very well. Patient has reported no falls.   Pertinent History CHF, CKD and diabetic neuropathy, HTN, R TKR 2007, cardiac defibrilator,  Fistula R upper arm   Patient Stated Goals want to be able to write, drive and feed myself with R hand. Need full use of R arm and leg.   Currently in Pain? No/denies                         [facility] Adult PT Treatment/Exercise - 03/18/15 0001    Knee/Hip Exercises: Aerobic   Nustep L5 x15 min UE/LE with monitoring for activity tolerance   Knee/Hip Exercises: Machines for Strengthening   Cybex Knee Flexion 20# 3x10   Knee/Hip Exercises: Standing   Hip Flexion Stengthening;Right;2 sets;10 reps  with red tband   Forward Step Up Right;2 sets;10 reps;Hand Hold: 2;Step Height: 6"   Rocker  Board 4 minutes  stretch and balance   Knee/Hip Exercises: Seated   [place] Quad Strengthening;Right;3 sets;10 reps   [place] Quad Weight 4 lbs.   Knee/Hip Exercises: Supine   Bridges Both;Strengthening;10 reps;1 set  with red swiss ball for straight    Ankle Exercises: Standing   Other Standing Ankle Exercises Dyna disc EV/INV,PF/DF, circles CW/CCW all 2x10 each way   Ankle Exercises: Seated   Other Seated Ankle Exercises ankle isolater 1 1/2 # DF 2x10, circles 2x10                     PT Long Term Goals - 03/18/15 1333    PT LONG TERM GOAL #1   Title I with HEP   Time 4   Period Weeks   Status On-going   PT LONG TERM GOAL #2   Title improved R knee flexion to 90 degrees to improve function   Time 8   Period Weeks   Status On-going   PT LONG TERM GOAL #3   Title decreased pain in RLE to 3/10 with weightbearing   Time 8   Status Achieved  no pain with weight bearing (03/18/15)   PT LONG TERM GOAL #4   Title demo 4+/5 R hip flexor strength or better to asisst with patient returning to driving.   Time 8   Period Weeks   Status On-going   PT LONG TERM GOAL #5   Title improved R ankle active DF to neutral or better to improve gait.   Time 8   Period Weeks   Status Achieved   PT LONG TERM GOAL #6   Title Patient to report no falls or giving out of knee in 4 weeks previous.   Time 8   Period Weeks   Status Achieved               Plan - 03/18/15 1340    Clinical Impression Statement Patient progressing with all activities today. Patient has reported being able to walk in community with cane with no difficulty. Patient reported no pain in right LE with weightbearing anymore. Patient met LTG # 3 others ongoing due to strength deficit.   Pt will benefit from skilled therapeutic intervention in order to improve on the following deficits Difficulty walking;Impaired UE functional use;Decreased endurance;Decreased activity tolerance;Pain;Decreased range of  motion;Decreased strength;Increased edema;Decreased mobility;Impaired flexibility;Decreased balance   Rehab Potential Good   PT Frequency 2x / week   PT Duration 8 weeks   PT Treatment/Interventions ADLs/Self Care Home Management;Electrical Stimulation;Cryotherapy;Moist Heat;Ultrasound;Gait training;Therapeutic exercise;Manual techniques;Patient/family education;Neuromuscular re-education;Balance training;Passive range of motion;Dry needling;Vasopneumatic Device   PT Next Visit Plan Continue LE/UE strengthening    MD progress note.  MD script for  AFO?   Consulted and Agree with Plan of Care Patient        Problem List Patient Active Problem List   Diagnosis Date Noted  . At high risk for falls 09/29/2014  . Peripheral vascular disease ([place]) 09/29/2014  . Osteoarthritis of both knees 01/07/2014  . Osteoarthritis of multiple joints 10/06/2013  . NICM (nonischemic cardiomyopathy) ([place]) 06/27/2013  . CAD (coronary artery disease) 03/13/2013  . Abnormal nuclear stress test 01/27/2013  . Chronic systolic congestive heart failure ([place]) 01/27/2013  . COPD (chronic obstructive pulmonary disease) ([place]) 12/27/2012  . Diabetic peripheral neuropathy ([place]) 02/10/2012  . ANEMIA, MILD 01/18/2010  . Chronic kidney disease (CKD), stage IV (severe) ([place]) 01/18/2010  . PYELITIS/PYELONEPHRITIS DISEASES CLASSIFIED ELSW 09/28/2009  . URINARY CALCULUS 09/28/2009  . ADJUSTMENT DISORDER WITH DEPRESSED MOOD 08/21/2009  . Poorly controlled type II diabetes mellitus with renal complication ([place]) 64/29/0379  . Hyperlipidemia 04/14/2008  . Essential hypertension 04/14/2008  . ALLERGIC RHINITIS 04/14/2008    Phillips Climes, PTA 03/18/2015, 1:58 PM  [facility] [address] [place], [place], [address] Phone: [phone]   Fax:  [phone]  Name: Anthony Bullock MRN: [id number] Date of Birth: [birth year]

## 2015-03-19 ENCOUNTER — Encounter (HOSPITAL_COMMUNITY)
Admission: RE | Admit: 2015-03-19 | Discharge: 2015-03-19 | Disposition: A | Discharge status: Home / Self Care | Payer: BLUE CROSS/BLUE SHIELD | Source: Ambulatory Visit | Attending: Nephrology | Admitting: Nephrology

## 2015-03-19 DIAGNOSIS — N183 Chronic kidney disease, stage 3 (moderate): Secondary | ICD-10-CM | POA: Diagnosis not present

## 2015-03-19 LAB — IRON AND TIBC
Iron: 50 ug/dL (ref 45–182)
Saturation Ratios: 12 % — ABNORMAL LOW (ref 17.9–39.5)
TIBC: 421 ug/dL (ref 250–450)
UIBC: 371 ug/dL

## 2015-03-19 LAB — POCT HEMOGLOBIN-HEMACUE: Hemoglobin: 9.7 g/dL — ABNORMAL LOW (ref 13.0–17.0)

## 2015-03-19 LAB — FERRITIN: Ferritin: 46 ng/mL (ref 24–336)

## 2015-03-19 MED ORDER — DARBEPOETIN ALFA 100 MCG/0.5ML IJ SOSY
PREFILLED_SYRINGE | INTRAMUSCULAR | Status: AC
  Filled 2015-03-19: qty 0.5

## 2015-03-19 MED ORDER — DARBEPOETIN ALFA 100 MCG/0.5ML IJ SOSY
100.0000 ug | PREFILLED_SYRINGE | INTRAMUSCULAR | Status: DC
  Administered 2015-03-19: 100 ug via SUBCUTANEOUS

## 2015-03-20 ENCOUNTER — Ambulatory Visit: Payer: BLUE CROSS/BLUE SHIELD | Admitting: Physical Therapy

## 2015-03-20 DIAGNOSIS — R531 Weakness: Secondary | ICD-10-CM | POA: Diagnosis not present

## 2015-03-20 DIAGNOSIS — M2560 Stiffness of unspecified joint, not elsewhere classified: Secondary | ICD-10-CM

## 2015-03-20 DIAGNOSIS — R5381 Other malaise: Secondary | ICD-10-CM

## 2015-03-20 DIAGNOSIS — M25561 Pain in right knee: Secondary | ICD-10-CM

## 2015-03-20 NOTE — Therapy (Signed)
[facility] [address] [place], [place], [address] Phone: [phone]   Fax:  [phone]  Physical Therapy Treatment  Patient Details  Name: Anthony Bullock MRN: [id number] Date of Birth: 1960-07-14 Referring Provider: Evelena Peat MD  Encounter Date: 03/20/2015      PT End of Session - 03/20/15 1230    Visit Number 12   Number of Visits 16   Date for PT Re-Evaluation 04/07/15   PT Start Time 1034      Past Medical History  Diagnosis Date  . NICM (nonischemic cardiomyopathy) (HCC)     Primarily nonischemic. Echo (12/14) with EF 25-30%. Echo (3/15) with EF 25%, mild to moderately dilated LV, normal RV size and systolic function.   . Diabetes mellitus type II   . Hyperlipidemia   . Hypertension   . Urethral stricture   . Kidney stones   . History of cardiac catheterization     a.Myoview 1/15:  There is significant left ventricular dysfunction. There may be slight scar at the apex. There is no significant ischemia. LV Ejection Fraction: 27%  //  b. RHC/LHC (1/15) with mean RA 6, PA 47/22 mean 33, mean PCWP 20, PVR 2.5 WU, CI 2.5; 80% dLAD stenosis, 70% diffuse large D.   . Chronic systolic heart failure (HCC)     a. ECHO (12/2012) EF 25-30%, HK entireanteroseptal myocardium //  b.  EF 25%, diffuse HK, grade 1 diastolic dysfunction, MAC, mild LAE, normal RVSF, trivial pericardial effusion  . Allergic rhinitis   . CKD (chronic kidney disease)   . Diabetic neuropathy (HCC)   . Diabetic nephropathy [facility])     Past Surgical History  Procedure Laterality Date  . Reapea urethral surgery for recurrent obstruction  2011  . Total knee arthroplasty  2007  . Left a nd right heart cath  01/30/2013    DR BENSIHMON  . Cardiac defibrillator placement  06/27/2013    Sub Q       BY DR Graciela Husbands  . Left and right heart catheterization with coronary angiogram N/A 01/30/2013    Procedure: LEFT AND RIGHT HEART CATHETERIZATION WITH CORONARY  ANGIOGRAM;  Surgeon: Dolores Patty, MD;  Location: [facility] CATH LAB;  Service: Cardiovascular;  Laterality: N/A;  . Implantable cardioverter defibrillator implant N/A 06/27/2013    Procedure: SUB Q ICD;  Surgeon: Duke Salvia, MD;  Location: [facility] CATH LAB;  Service: Cardiovascular;  Laterality: N/A;  . Bascilic vein transposition Right 08/22/2014    Procedure: RIGHT UPPER ARM BASCILIC VEIN TRANSPOSITION;  Surgeon: Chuck Hint, MD;  Location: [facility] OR;  Service: Vascular;  Laterality: Right;  . Peripheral vascular catheterization Right 01/26/2015    Procedure: A/V Fistulagram;  Surgeon: Chuck Hint, MD;  Location: [facility] INVASIVE CV LAB;  Service: Cardiovascular;  Laterality: Right;    There were no vitals filed for this visit.  Visit Diagnosis:  Weakness  Right knee pain  Stiffness of multiple joints  Debility                       [facility] Adult PT Treatment/Exercise - 03/20/15 0001    Knee/Hip Exercises: Aerobic   Nustep Level 5 x 15 minutes.   Knee/Hip Exercises: Machines for Strengthening   Cybex Knee Extension 10# 3 x 10   Cybex Knee Flexion 20# 3 x 10   Ankle Exercises: Standing   Rocker Board --  6 minutes.   Ankle Exercises: Seated   Other Seated  Ankle Exercises Ankle isolator and dynadisc total 10 minutes.                     PT Long Term Goals - 03/18/15 1333    PT LONG TERM GOAL #1   Title I with HEP   Time 4   Period Weeks   Status On-going   PT LONG TERM GOAL #2   Title improved R knee flexion to 90 degrees to improve function   Time 8   Period Weeks   Status On-going   PT LONG TERM GOAL #3   Title decreased pain in RLE to 3/10 with weightbearing   Time 8   Status Achieved  no pain with weight bearing (03/18/15)   PT LONG TERM GOAL #4   Title demo 4+/5 R hip flexor strength or better to asisst with patient returning to driving.   Time 8   Period Weeks   Status On-going   PT LONG TERM GOAL #5   Title improved R  ankle active DF to neutral or better to improve gait.   Time 8   Period Weeks   Status Achieved   PT LONG TERM GOAL #6   Title Patient to report no falls or giving out of knee in 4 weeks previous.   Time 8   Period Weeks   Status Achieved               Problem List Patient Active Problem List   Diagnosis Date Noted  . At high risk for falls 09/29/2014  . Peripheral vascular disease (HCC) 09/29/2014  . Osteoarthritis of both knees 01/07/2014  . Osteoarthritis of multiple joints 10/06/2013  . NICM (nonischemic cardiomyopathy) (HCC) 06/27/2013  . CAD (coronary artery disease) 03/13/2013  . Abnormal nuclear stress test 01/27/2013  . Chronic systolic congestive heart failure (HCC) 01/27/2013  . COPD (chronic obstructive pulmonary disease) (HCC) 12/27/2012  . Diabetic peripheral neuropathy (HCC) 02/10/2012  . ANEMIA, MILD 01/18/2010  . Chronic kidney disease (CKD), stage IV (severe) (HCC) 01/18/2010  . PYELITIS/PYELONEPHRITIS DISEASES CLASSIFIED ELSW 09/28/2009  . URINARY CALCULUS 09/28/2009  . ADJUSTMENT DISORDER WITH DEPRESSED MOOD 08/21/2009  . Poorly controlled type II diabetes mellitus with renal complication (HCC) 04/14/2008  . Hyperlipidemia 04/14/2008  . Essential hypertension 04/14/2008  . ALLERGIC RHINITIS 04/14/2008    Anthony Bullock, [place] MPT 03/20/2015, 12:49 PM  [facility] [address] [place], [place], [address] Phone: [phone]   Fax:  [phone]  Name: Anthony Bullock MRN: [id number] Date of Birth: November 10, 1960

## 2015-03-23 ENCOUNTER — Ambulatory Visit: Payer: BLUE CROSS/BLUE SHIELD | Admitting: Physical Therapy

## 2015-03-23 ENCOUNTER — Encounter: Payer: Self-pay | Admitting: Physical Therapy

## 2015-03-23 DIAGNOSIS — R531 Weakness: Secondary | ICD-10-CM

## 2015-03-23 DIAGNOSIS — R5381 Other malaise: Secondary | ICD-10-CM

## 2015-03-23 DIAGNOSIS — M25561 Pain in right knee: Secondary | ICD-10-CM

## 2015-03-23 DIAGNOSIS — M2560 Stiffness of unspecified joint, not elsewhere classified: Secondary | ICD-10-CM

## 2015-03-23 NOTE — Therapy (Signed)
[facility] [place], [place], [address] Phone: [phone]   Fax:  [phone]  Physical Therapy Treatment  Patient Details  Name: Anthony Bullock MRN: [id number] Date of Birth: March 20, 1960 Referring Provider: Carolann Littler MD  Encounter Date: 03/23/2015      PT End of Session - 03/23/15 1346    Visit Number 13   Number of Visits 16   Date for PT Re-Evaluation 04/07/15   PT Start Time 1318   PT Stop Time 1359   PT Time Calculation (min) 41 min   Activity Tolerance Patient tolerated treatment well   Behavior During Therapy [facility] for tasks assessed/performed      Past Medical History  Diagnosis Date  . NICM (nonischemic cardiomyopathy) ([place])     Primarily nonischemic. Echo (12/14) with EF 25-30%. Echo (3/15) with EF 25%, mild to moderately dilated LV, normal RV size and systolic function.   . Diabetes mellitus type II   . Hyperlipidemia   . Hypertension   . Urethral stricture   . Kidney stones   . History of cardiac catheterization     a.Myoview 1/15:  There is significant left ventricular dysfunction. There may be slight scar at the apex. There is no significant ischemia. LV Ejection Fraction: 27%  //  b. RHC/LHC (1/15) with mean RA 6, PA 47/22 mean 33, mean PCWP 20, PVR 2.5 WU, CI 2.5; 80% dLAD stenosis, 70% diffuse large D.   . Chronic systolic heart failure ([place])     a. ECHO (12/2012) EF 25-30%, HK entireanteroseptal myocardium //  b.  EF 25%, diffuse HK, grade 1 diastolic dysfunction, MAC, mild LAE, normal RVSF, trivial pericardial effusion  . Allergic rhinitis   . CKD (chronic kidney disease)   . Diabetic neuropathy ([place])   . Diabetic nephropathy [facility])     Past Surgical History  Procedure Laterality Date  . Reapea urethral surgery for recurrent obstruction  2011  . Total knee arthroplasty  2007  . Left a nd right heart cath  01/30/2013    DR BENSIHMON  . Cardiac defibrillator placement  06/27/2013    Sub  Q       BY DR Caryl Comes  . Left and right heart catheterization with coronary angiogram N/A 01/30/2013    Procedure: LEFT AND RIGHT HEART CATHETERIZATION WITH CORONARY ANGIOGRAM;  Surgeon: Jolaine Artist, MD;  Location: [facility] CATH LAB;  Service: Cardiovascular;  Laterality: N/A;  . Implantable cardioverter defibrillator implant N/A 06/27/2013    Procedure: SUB Q ICD;  Surgeon: Deboraha Sprang, MD;  Location: [facility] CATH LAB;  Service: Cardiovascular;  Laterality: N/A;  . Bascilic vein transposition Right 08/22/2014    Procedure: RIGHT UPPER ARM [place];  Surgeon: Angelia Mould, MD;  Location: [place];  Service: Vascular;  Laterality: Right;  . Peripheral vascular catheterization Right 01/26/2015    Procedure: A/V Fistulagram;  Surgeon: Angelia Mould, MD;  Location: [place] CV LAB;  Service: Cardiovascular;  Laterality: Right;    There were no vitals filed for this visit.  Visit Diagnosis:  Weakness  Right knee pain  Stiffness of multiple joints  Debility      Subjective Assessment - 03/23/15 1321    Subjective Patient reported driving this weekend and did great per wife yet was fatigue   Patient is accompained by: Family member   Pertinent History CHF, CKD and diabetic neuropathy, HTN, R TKR 2007, cardiac defibrilator, Fistula R upper arm   Patient Stated Goals  want to be able to write, drive and feed myself with R hand. Need full use of R arm and leg.   Currently in Pain? No/denies                         [facility] Adult PT Treatment/Exercise - 03/23/15 0001    Knee/Hip Exercises: Aerobic   Nustep Level 5 x 15 minutes LE only to UE/LE foe activity tolerance   Knee/Hip Exercises: Machines for Strengthening   Cybex Knee Extension 10# 3 x 10   Cybex Knee Flexion 20# 3 x 10   Knee/Hip Exercises: Standing   Rocker Board 4 minutes   Other Standing Knee Exercises Resisted walking 4 ways with pink XTS x5 each way   Knee/Hip Exercises: Seated    Marching Limitations seated right hip flexion with yellow t-band 2x10   Abduction/Adduction  Strengthening;Right;2 sets;10 reps  red tband   Ankle Exercises: Seated   Other Seated Ankle Exercises ankle isolator 1 1/2#DF and circles 2x20 each                PT Education - 03/23/15 1404    Education Details HEP   Person(s) Educated Patient   Methods Explanation;Demonstration;Handout   Comprehension Verbalized understanding;Returned demonstration             PT Long Term Goals - 03/23/15 1406    PT LONG TERM GOAL #1   Title I with HEP   Time 4   Period Weeks   Status Achieved   PT LONG TERM GOAL #2   Title improved R knee flexion to 90 degrees to improve function   Time 8   Period Weeks   Status On-going   PT LONG TERM GOAL #3   Title decreased pain in RLE to 3/10 with weightbearing   Time 8   Period Weeks   Status Achieved   PT LONG TERM GOAL #4   Title demo 4+/5 R hip flexor strength or better to asisst with patient returning to driving.   Time 8   Period Weeks   Status On-going   PT LONG TERM GOAL #5   Title improved R ankle active DF to neutral or better to improve gait.   Time 8   Period Weeks   Status Achieved   PT LONG TERM GOAL #6   Title Patient to report no falls or giving out of knee in 4 weeks previous.   Time 8   Period Weeks   Status Achieved               Plan - 03/23/15 1407    Clinical Impression Statement Patient continues to progress overall. Wife reports 60% better overall. Patient does not need wife to help him with getting in and out of bed or getting on or off comode. Ptient has become independent with all transfers using [facility] for assistance. Patient able to use cane in community with little fatigue. Patient has also drove a few blocks with some fatigue. Patient is independent with HEP and met LTG#1 others ongoing due to strength deficit.   Pt will benefit from skilled therapeutic intervention in order to improve on the  following deficits Difficulty walking;Impaired UE functional use;Decreased endurance;Decreased activity tolerance;Pain;Decreased range of motion;Decreased strength;Increased edema;Decreased mobility;Impaired flexibility;Decreased balance   Rehab Potential Good   PT Frequency 2x / week   PT Duration 8 weeks   PT Treatment/Interventions ADLs/Self Care Home Management;Electrical Stimulation;Cryotherapy;Moist Heat;Ultrasound;Gait training;Therapeutic exercise;Manual techniques;Patient/family education;Neuromuscular re-education;Balance  training;Passive range of motion;Dry needling;Vasopneumatic Device   PT Next Visit Plan Continue LE/UE strengthening      Consulted and Agree with Plan of Care Patient        Problem List Patient Active Problem List   Diagnosis Date Noted  . At high risk for falls 09/29/2014  . Peripheral vascular disease ([place]) 09/29/2014  . Osteoarthritis of both knees 01/07/2014  . Osteoarthritis of multiple joints 10/06/2013  . NICM (nonischemic cardiomyopathy) ([place]) 06/27/2013  . CAD (coronary artery disease) 03/13/2013  . Abnormal nuclear stress test 01/27/2013  . Chronic systolic congestive heart failure ([place]) 01/27/2013  . COPD (chronic obstructive pulmonary disease) ([place]) 12/27/2012  . Diabetic peripheral neuropathy ([place]) 02/10/2012  . ANEMIA, MILD 01/18/2010  . Chronic kidney disease (CKD), stage IV (severe) ([place]) 01/18/2010  . PYELITIS/PYELONEPHRITIS DISEASES CLASSIFIED ELSW 09/28/2009  . URINARY CALCULUS 09/28/2009  . ADJUSTMENT DISORDER WITH DEPRESSED MOOD 08/21/2009  . Poorly controlled type II diabetes mellitus with renal complication ([place]) 56/94/3700  . Hyperlipidemia 04/14/2008  . Essential hypertension 04/14/2008  . ALLERGIC RHINITIS 04/14/2008    Phillips Climes, PTA 03/23/2015, 2:12 PM  [facility] [place], [place], [address] Phone: [phone]   Fax:  [phone]  Name: Anthony Bullock MRN: [id number] Date of Birth: 1960-05-22

## 2015-03-23 NOTE — Patient Instructions (Signed)
Strengthening: Hip Flexion - Resisted    With  Yellow tubing around left ankle, anchor behind, bring leg forward, keeping knee straight. HOLD ON TO COUNTER WITH EXERCISE Repeat _10___ times per set. Do __2-3__ sets per session. Do __2__ sessions per day.  ALSO CAN DO SEATED WITH BEND ON LEFT FOOT TO HOLD BAND AND ON RIGHT THIGH and PERFORM SEATED MARCH    Strengthening: Hip Abductor - Resisted    With RED band looped around both legs above knees, push thighs apart. Repeat __10__ times per set. Do _2-3___ sets per session. Do __2__ sessions per day. MAY PERFORM SEATED

## 2015-03-24 ENCOUNTER — Telehealth: Payer: Self-pay | Admitting: Family Medicine

## 2015-03-24 NOTE — Telephone Encounter (Signed)
Pt needs work note from 03-20-15 and will not be returning to work indefinitely.

## 2015-03-24 NOTE — Telephone Encounter (Signed)
yes

## 2015-03-24 NOTE — Telephone Encounter (Signed)
Okay for note

## 2015-03-24 NOTE — Telephone Encounter (Signed)
Left message. Work note is on my desk and signed. Need to know if he wants to pick this up or have it faxed to his employer.

## 2015-03-25 ENCOUNTER — Ambulatory Visit: Payer: BLUE CROSS/BLUE SHIELD | Admitting: Physical Therapy

## 2015-03-25 ENCOUNTER — Encounter: Payer: Self-pay | Admitting: Family Medicine

## 2015-03-25 NOTE — Telephone Encounter (Signed)
Note has been faxed to his employer.

## 2015-03-25 NOTE — Telephone Encounter (Signed)
Refaxed note

## 2015-03-25 NOTE — Telephone Encounter (Signed)
Wife called to advise pt's would like faxed to his employer at  [phone]  Attn: Lattie Haw

## 2015-03-25 NOTE — Telephone Encounter (Signed)
Pt wife said the note had wrong dates. The note should be from 03-20-15 to indefinitely.

## 2015-03-26 ENCOUNTER — Encounter (HOSPITAL_COMMUNITY)
Admission: RE | Admit: 2015-03-26 | Discharge: 2015-03-26 | Disposition: A | Discharge status: Home / Self Care | Payer: BLUE CROSS/BLUE SHIELD | Source: Ambulatory Visit | Attending: Nephrology | Admitting: Nephrology

## 2015-03-26 DIAGNOSIS — N183 Chronic kidney disease, stage 3 (moderate): Secondary | ICD-10-CM | POA: Diagnosis not present

## 2015-03-26 LAB — RENAL FUNCTION PANEL
Albumin: 2.4 g/dL — ABNORMAL LOW (ref 3.5–5.0)
Anion gap: 13 (ref 5–15)
BUN: 115 mg/dL — ABNORMAL HIGH (ref 6–20)
CO2: 19 mmol/L — ABNORMAL LOW (ref 22–32)
Calcium: 8 mg/dL — ABNORMAL LOW (ref 8.9–10.3)
Chloride: 109 mmol/L (ref 101–111)
Creatinine, Ser: 8.37 mg/dL — ABNORMAL HIGH (ref 0.61–1.24)
GFR calc Af Amer: 7 mL/min — ABNORMAL LOW (ref >60)
GFR calc non Af Amer: 6 mL/min — ABNORMAL LOW (ref >60)
Glucose, Bld: 177 mg/dL — ABNORMAL HIGH (ref 65–99)
Phosphorus: 10.3 mg/dL — ABNORMAL HIGH (ref 2.5–4.6)
Potassium: 4.6 mmol/L (ref 3.5–5.1)
Sodium: 141 mmol/L (ref 135–145)

## 2015-03-26 LAB — POCT HEMOGLOBIN-HEMACUE: Hemoglobin: 9.4 g/dL — ABNORMAL LOW (ref 13.0–17.0)

## 2015-03-26 MED ORDER — DARBEPOETIN ALFA 100 MCG/0.5ML IJ SOSY
100.0000 ug | PREFILLED_SYRINGE | INTRAMUSCULAR | Status: DC
  Administered 2015-03-26: 100 ug via SUBCUTANEOUS

## 2015-03-26 MED ORDER — DARBEPOETIN ALFA 100 MCG/0.5ML IJ SOSY
PREFILLED_SYRINGE | INTRAMUSCULAR | Status: AC
  Filled 2015-03-26: qty 0.5

## 2015-03-27 ENCOUNTER — Encounter: Payer: Self-pay | Admitting: Physical Therapy

## 2015-03-27 ENCOUNTER — Ambulatory Visit: Payer: BLUE CROSS/BLUE SHIELD | Admitting: Physical Therapy

## 2015-03-27 DIAGNOSIS — R531 Weakness: Secondary | ICD-10-CM

## 2015-03-27 DIAGNOSIS — M2560 Stiffness of unspecified joint, not elsewhere classified: Secondary | ICD-10-CM

## 2015-03-27 DIAGNOSIS — R5381 Other malaise: Secondary | ICD-10-CM

## 2015-03-27 DIAGNOSIS — M25561 Pain in right knee: Secondary | ICD-10-CM

## 2015-03-27 LAB — HEPATITIS B SURFACE ANTIGEN: Hepatitis B Surface Ag: NEGATIVE

## 2015-03-27 NOTE — Therapy (Signed)
[facility] [place], [place], [address] Phone: [phone]   Fax:  [phone]  Physical Therapy Treatment  Patient Details  Name: Anthony Bullock MRN: [id number] Date of Birth: 12/12/1960 Referring Provider: Carolann Littler MD  Encounter Date: 03/27/2015      PT End of Session - 03/27/15 1133    Visit Number 14   Number of Visits 16   Date for PT Re-Evaluation 04/07/15   PT Start Time 1116   PT Stop Time 1159   PT Time Calculation (min) 43 min   Activity Tolerance Patient tolerated treatment well   Behavior During Therapy [facility] for tasks assessed/performed      Past Medical History  Diagnosis Date  . NICM (nonischemic cardiomyopathy) ([place])     Primarily nonischemic. Echo (12/14) with EF 25-30%. Echo (3/15) with EF 25%, mild to moderately dilated LV, normal RV size and systolic function.   . Diabetes mellitus type II   . Hyperlipidemia   . Hypertension   . Urethral stricture   . Kidney stones   . History of cardiac catheterization     a.Myoview 1/15:  There is significant left ventricular dysfunction. There may be slight scar at the apex. There is no significant ischemia. LV Ejection Fraction: 27%  //  b. RHC/LHC (1/15) with mean RA 6, PA 47/22 mean 33, mean PCWP 20, PVR 2.5 WU, CI 2.5; 80% dLAD stenosis, 70% diffuse large D.   . Chronic systolic heart failure ([place])     a. ECHO (12/2012) EF 25-30%, HK entireanteroseptal myocardium //  b.  EF 25%, diffuse HK, grade 1 diastolic dysfunction, MAC, mild LAE, normal RVSF, trivial pericardial effusion  . Allergic rhinitis   . CKD (chronic kidney disease)   . Diabetic neuropathy ([place])   . Diabetic nephropathy [facility])     Past Surgical History  Procedure Laterality Date  . Reapea urethral surgery for recurrent obstruction  2011  . Total knee arthroplasty  2007  . Left a nd right heart cath  01/30/2013    DR BENSIHMON  . Cardiac defibrillator placement  06/27/2013    Sub  Q       BY DR Caryl Comes  . Left and right heart catheterization with coronary angiogram N/A 01/30/2013    Procedure: LEFT AND RIGHT HEART CATHETERIZATION WITH CORONARY ANGIOGRAM;  Surgeon: Jolaine Artist, MD;  Location: [facility] CATH LAB;  Service: Cardiovascular;  Laterality: N/A;  . Implantable cardioverter defibrillator implant N/A 06/27/2013    Procedure: SUB Q ICD;  Surgeon: Deboraha Sprang, MD;  Location: [facility] CATH LAB;  Service: Cardiovascular;  Laterality: N/A;  . Bascilic vein transposition Right 08/22/2014    Procedure: RIGHT UPPER ARM [place];  Surgeon: Angelia Mould, MD;  Location: [place];  Service: Vascular;  Laterality: Right;  . Peripheral vascular catheterization Right 01/26/2015    Procedure: A/V Fistulagram;  Surgeon: Angelia Mould, MD;  Location: [place] CV LAB;  Service: Cardiovascular;  Laterality: Right;    There were no vitals filed for this visit.  Visit Diagnosis:  Weakness  Right knee pain  Stiffness of multiple joints  Debility      Subjective Assessment - 03/27/15 1203    Subjective Patient reports legs feeling tight and sore secondary to prolonged walking during trip to [employer] yesterday.   Pertinent History CHF, CKD and diabetic neuropathy, HTN, R TKR 2007, cardiac defibrilator, Fistula R upper arm   Patient Stated Goals want to be able to  write, drive and feed myself with R hand. Need full use of R arm and leg.   Currently in Pain? Yes   Pain Score 4    Pain Location Leg   Pain Orientation Left;Right   Pain Descriptors / Indicators Sore   Pain Type Chronic pain   Pain Onset More than a month ago            [facility] PT Assessment - 03/27/15 0001    Assessment   Medical Diagnosis PVD and foot drop   Onset Date/Surgical Date 09/10/14   Next MD Visit none scheduled   Precautions   Precaution Comments FALL Risk; Fistula (no pressure on upper R arm)                     [facility] Adult PT Treatment/Exercise -  03/27/15 0001    Knee/Hip Exercises: Stretches   Active Hamstring Stretch Both;3 reps;30 seconds;Other (comment)  off 8" step   Knee/Hip Exercises: Aerobic   Nustep Level 5 x 15 minutes LE only to UE/LE for activity tolerance   Knee/Hip Exercises: Machines for Strengthening   Cybex Knee Extension 10# 2 x 10   Cybex Knee Flexion 20# 3 x 10   Knee/Hip Exercises: Standing   Rocker Board 5 minutes   Other Standing Knee Exercises Toe taps B 8"step x30 reps each   Knee/Hip Exercises: Seated   Long Arc Quad Strengthening;Right;3 sets;10 reps   Long Arc Quad Weight 4 lbs.   Clamshell with TheraBand Red  x20 reps   Other Seated Knee/Hip Exercises Seated R hip flexion 3# x10 reps   Ankle Exercises: Seated   Other Seated Ankle Exercises R ankle isolator 1.5# DF x30 reps                     PT Long Term Goals - 03/23/15 1406    PT LONG TERM GOAL #1   Title I with HEP   Time 4   Period Weeks   Status Achieved   PT LONG TERM GOAL #2   Title improved R knee flexion to 90 degrees to improve function   Time 8   Period Weeks   Status On-going   PT LONG TERM GOAL #3   Title decreased pain in RLE to 3/10 with weightbearing   Time 8   Period Weeks   Status Achieved   PT LONG TERM GOAL #4   Title demo 4+/5 R hip flexor strength or better to asisst with patient returning to driving.   Time 8   Period Weeks   Status On-going   PT LONG TERM GOAL #5   Title improved R ankle active DF to neutral or better to improve gait.   Time 8   Period Weeks   Status Achieved   PT LONG TERM GOAL #6   Title Patient to report no falls or giving out of knee in 4 weeks previous.   Time 8   Period Weeks   Status Achieved               Plan - 03/27/15 1159    Clinical Impression Statement Patient tolerated today's treatment well although he was limited slightly due to fatigue. Patient requested exercises to assist with moving RLE into car and tolerated resisted R hip flexion exercises  well. HS and calf stretching was completed today secondary to patient reports of LE tightness following prolonged ambulation yesterday. Patient experienced feeling better following stretches completed today in  treatment per patient report.   Pt will benefit from skilled therapeutic intervention in order to improve on the following deficits Difficulty walking;Impaired UE functional use;Decreased endurance;Decreased activity tolerance;Pain;Decreased range of motion;Decreased strength;Increased edema;Decreased mobility;Impaired flexibility;Decreased balance   Rehab Potential Good   PT Frequency 2x / week   PT Duration 8 weeks   PT Treatment/Interventions ADLs/Self Care Home Management;Electrical Stimulation;Cryotherapy;Moist Heat;Ultrasound;Gait training;Therapeutic exercise;Manual techniques;Patient/family education;Neuromuscular re-education;Balance training;Passive range of motion;Dry needling;Vasopneumatic Device   PT Next Visit Plan Continue LE/UE strengthening per MPT POC   PT Home Exercise Plan bridging, BUE flexion stretch, supine hip flexion; yellow putty gripping for B hands   Consulted and Agree with Plan of Care Patient        Problem List Patient Active Problem List   Diagnosis Date Noted  . At high risk for falls 09/29/2014  . Peripheral vascular disease ([place]) 09/29/2014  . Osteoarthritis of both knees 01/07/2014  . Osteoarthritis of multiple joints 10/06/2013  . NICM (nonischemic cardiomyopathy) ([place]) 06/27/2013  . CAD (coronary artery disease) 03/13/2013  . Abnormal nuclear stress test 01/27/2013  . Chronic systolic congestive heart failure ([place]) 01/27/2013  . COPD (chronic obstructive pulmonary disease) ([place]) 12/27/2012  . Diabetic peripheral neuropathy ([place]) 02/10/2012  . ANEMIA, MILD 01/18/2010  . Chronic kidney disease (CKD), stage IV (severe) ([place]) 01/18/2010  . PYELITIS/PYELONEPHRITIS DISEASES CLASSIFIED ELSW 09/28/2009  . URINARY CALCULUS 09/28/2009  . ADJUSTMENT  DISORDER WITH DEPRESSED MOOD 08/21/2009  . Poorly controlled type II diabetes mellitus with renal complication ([place]) [id number]  . Hyperlipidemia 04/14/2008  . Essential hypertension 04/14/2008  . ALLERGIC RHINITIS 04/14/2008    Wynelle Fanny, PTA 03/27/2015, 12:05 PM  [facility] [place], [place], [address] Phone: [phone]   Fax:  [phone]  Name: Anthony Bullock MRN: [id number] Date of Birth: 1960-07-14

## 2015-03-30 ENCOUNTER — Ambulatory Visit: Payer: BLUE CROSS/BLUE SHIELD | Admitting: Physical Therapy

## 2015-03-30 ENCOUNTER — Encounter: Payer: Self-pay | Admitting: Physical Therapy

## 2015-03-30 DIAGNOSIS — M25561 Pain in right knee: Secondary | ICD-10-CM

## 2015-03-30 DIAGNOSIS — R531 Weakness: Secondary | ICD-10-CM

## 2015-03-30 DIAGNOSIS — R5381 Other malaise: Secondary | ICD-10-CM

## 2015-03-30 DIAGNOSIS — M2560 Stiffness of unspecified joint, not elsewhere classified: Secondary | ICD-10-CM

## 2015-03-30 NOTE — Therapy (Signed)
[facility] [place], [place], [address] Phone: [phone]   Fax:  [phone]  Physical Therapy Treatment  Patient Details  Name: Anthony Bullock MRN: [id number] Date of Birth: 02-Dec-1960 Referring Provider: Carolann Littler MD  Encounter Date: 03/30/2015      PT End of Session - 03/30/15 1326    Visit Number 15   Number of Visits 16   Date for PT Re-Evaluation 04/07/15   PT Start Time 1314   PT Stop Time 1356   PT Time Calculation (min) 42 min   Activity Tolerance Patient tolerated treatment well   Behavior During Therapy [facility] for tasks assessed/performed      Past Medical History  Diagnosis Date  . NICM (nonischemic cardiomyopathy) ([place])     Primarily nonischemic. Echo (12/14) with EF 25-30%. Echo (3/15) with EF 25%, mild to moderately dilated LV, normal RV size and systolic function.   . Diabetes mellitus type II   . Hyperlipidemia   . Hypertension   . Urethral stricture   . Kidney stones   . History of cardiac catheterization     a.Myoview 1/15:  There is significant left ventricular dysfunction. There may be slight scar at the apex. There is no significant ischemia. LV Ejection Fraction: 27%  //  b. RHC/LHC (1/15) with mean RA 6, PA 47/22 mean 33, mean PCWP 20, PVR 2.5 WU, CI 2.5; 80% dLAD stenosis, 70% diffuse large D.   . Chronic systolic heart failure ([place])     a. ECHO (12/2012) EF 25-30%, HK entireanteroseptal myocardium //  b.  EF 25%, diffuse HK, grade 1 diastolic dysfunction, MAC, mild LAE, normal RVSF, trivial pericardial effusion  . Allergic rhinitis   . CKD (chronic kidney disease)   . Diabetic neuropathy ([place])   . Diabetic nephropathy [facility])     Past Surgical History  Procedure Laterality Date  . Reapea urethral surgery for recurrent obstruction  2011  . Total knee arthroplasty  2007  . Left a nd right heart cath  01/30/2013    DR BENSIHMON  . Cardiac defibrillator placement  06/27/2013    Sub  Q       BY DR Caryl Comes  . Left and right heart catheterization with coronary angiogram N/A 01/30/2013    Procedure: LEFT AND RIGHT HEART CATHETERIZATION WITH CORONARY ANGIOGRAM;  Surgeon: Jolaine Artist, MD;  Location: [facility] CATH LAB;  Service: Cardiovascular;  Laterality: N/A;  . Implantable cardioverter defibrillator implant N/A 06/27/2013    Procedure: SUB Q ICD;  Surgeon: Deboraha Sprang, MD;  Location: [facility] CATH LAB;  Service: Cardiovascular;  Laterality: N/A;  . Bascilic vein transposition Right 08/22/2014    Procedure: RIGHT UPPER ARM [place];  Surgeon: Angelia Mould, MD;  Location: [place];  Service: Vascular;  Laterality: Right;  . Peripheral vascular catheterization Right 01/26/2015    Procedure: A/V Fistulagram;  Surgeon: Angelia Mould, MD;  Location: [place] CV LAB;  Service: Cardiovascular;  Laterality: Right;    There were no vitals filed for this visit.  Visit Diagnosis:  Right knee pain  Weakness  Stiffness of multiple joints  Debility      Subjective Assessment - 03/30/15 1318    Subjective Feeling better today, had an episode of blood sugar dropping and was in hospital, all resoved today and no complaints    Pertinent History CHF, CKD and diabetic neuropathy, HTN, R TKR 2007, cardiac defibrilator, Fistula R upper arm   Patient Stated Goals  want to be able to write, drive and feed myself with R hand. Need full use of R arm and leg.   Currently in Pain? No/denies                         [facility] Adult PT Treatment/Exercise - 03/30/15 0001    Knee/Hip Exercises: Aerobic   Nustep Level 5 x 15 minutes LE only to UE/LE for activity tolerance   Knee/Hip Exercises: Machines for Strengthening   Cybex Knee Extension 10# 2 x 10   Cybex Knee Flexion 20# 3 x 10   Knee/Hip Exercises: Standing   Hip Flexion AROM;Stengthening;Right;10 reps;1 set   Forward Step Up Right;2 sets;10 reps;Hand Hold: 2;Step Height: 6"   Rocker Board 4  minutes   Knee/Hip Exercises: Seated   Long Arc Quad Strengthening;Right;3 sets;10 reps   Long Arc Quad Weight 4 lbs.   Other Seated Knee/Hip Exercises seated for hip flexion lift and lower to similate in and out car, using cone for visual and height to lift, 2xfatigue patient comensating after fatigue   Ankle Exercises: Seated   Other Seated Ankle Exercises R ankle isolator 1.5# DF and circles x30 reps                     PT Long Term Goals - 03/23/15 1406    PT LONG TERM GOAL #1   Title I with HEP   Time 4   Period Weeks   Status Achieved   PT LONG TERM GOAL #2   Title improved R knee flexion to 90 degrees to improve function   Time 8   Period Weeks   Status On-going   PT LONG TERM GOAL #3   Title decreased pain in RLE to 3/10 with weightbearing   Time 8   Period Weeks   Status Achieved   PT LONG TERM GOAL #4   Title demo 4+/5 R hip flexor strength or better to asisst with patient returning to driving.   Time 8   Period Weeks   Status On-going   PT LONG TERM GOAL #5   Title improved R ankle active DF to neutral or better to improve gait.   Time 8   Period Weeks   Status Achieved   PT LONG TERM GOAL #6   Title Patient to report no falls or giving out of knee in 4 weeks previous.   Time 8   Period Weeks   Status Achieved               Plan - 03/30/15 1356    Clinical Impression Statement Patient progressing with all activities today. Patient reports being able to perform light ADL's with no difficulty and transfers independently at this time. Patient only has difficulty with getting in and out of the car. remaining goals ongoing due to ROM in knee and strength deficit   Pt will benefit from skilled therapeutic intervention in order to improve on the following deficits Difficulty walking;Impaired UE functional use;Decreased endurance;Decreased activity tolerance;Pain;Decreased range of motion;Decreased strength;Increased edema;Decreased  mobility;Impaired flexibility;Decreased balance   PT Frequency 2x / week   PT Duration 8 weeks   PT Treatment/Interventions ADLs/Self Care Home Management;Electrical Stimulation;Cryotherapy;Moist Heat;Ultrasound;Gait training;Therapeutic exercise;Manual techniques;Patient/family education;Neuromuscular re-education;Balance training;Passive range of motion;Dry needling;Vasopneumatic Device   PT Next Visit Plan Continue LE/UE strengthening per MPT POC 1 visit   Consulted and Agree with Plan of Care Patient        Problem  List Patient Active Problem List   Diagnosis Date Noted  . At high risk for falls 09/29/2014  . Peripheral vascular disease ([place]) 09/29/2014  . Osteoarthritis of both knees 01/07/2014  . Osteoarthritis of multiple joints 10/06/2013  . NICM (nonischemic cardiomyopathy) ([place]) 06/27/2013  . CAD (coronary artery disease) 03/13/2013  . Abnormal nuclear stress test 01/27/2013  . Chronic systolic congestive heart failure ([place]) 01/27/2013  . COPD (chronic obstructive pulmonary disease) ([place]) 12/27/2012  . Diabetic peripheral neuropathy ([place]) 02/10/2012  . ANEMIA, MILD 01/18/2010  . Chronic kidney disease (CKD), stage IV (severe) ([place]) 01/18/2010  . PYELITIS/PYELONEPHRITIS DISEASES CLASSIFIED ELSW 09/28/2009  . URINARY CALCULUS 09/28/2009  . ADJUSTMENT DISORDER WITH DEPRESSED MOOD 08/21/2009  . Poorly controlled type II diabetes mellitus with renal complication ([place]) [id number]  . Hyperlipidemia 04/14/2008  . Essential hypertension 04/14/2008  . ALLERGIC RHINITIS 04/14/2008    Phillips Climes, PTA 03/30/2015, 2:01 PM  [facility] [place], [place], [address] Phone: [phone]   Fax:  [phone]  Name: Anthony Bullock MRN: [id number] Date of Birth: [birth year]

## 2015-04-01 ENCOUNTER — Other Ambulatory Visit (HOSPITAL_COMMUNITY): Payer: Self-pay | Admitting: *Deleted

## 2015-04-01 ENCOUNTER — Encounter: Payer: Self-pay | Admitting: Physical Therapy

## 2015-04-01 ENCOUNTER — Ambulatory Visit: Payer: BLUE CROSS/BLUE SHIELD | Admitting: Physical Therapy

## 2015-04-01 DIAGNOSIS — R531 Weakness: Secondary | ICD-10-CM | POA: Diagnosis not present

## 2015-04-01 DIAGNOSIS — M2560 Stiffness of unspecified joint, not elsewhere classified: Secondary | ICD-10-CM

## 2015-04-01 DIAGNOSIS — R5381 Other malaise: Secondary | ICD-10-CM

## 2015-04-01 DIAGNOSIS — M25561 Pain in right knee: Secondary | ICD-10-CM

## 2015-04-01 NOTE — Therapy (Signed)
[facility] [address] [place], [place], [address] Phone: [phone]   Fax:  [phone]  Physical Therapy Treatment  Patient Details  Name: Anthony Bullock MRN: [id number] Date of Birth: April 28, 1960 Referring Provider: Evelena Peat MD  Encounter Date: 04/01/2015      PT End of Session - 04/01/15 1322    Visit Number 16   Number of Visits 16   Date for PT Re-Evaluation 04/07/15   PT Start Time 1318   PT Stop Time 1359   PT Time Calculation (min) 41 min   Activity Tolerance Patient tolerated treatment well   Behavior During Therapy [facility] for tasks assessed/performed      Past Medical History  Diagnosis Date  . NICM (nonischemic cardiomyopathy) ([facility])     Primarily nonischemic. Echo (12/14) with EF 25-30%. Echo (3/15) with EF 25%, mild to moderately dilated LV, normal RV size and systolic function.   . Diabetes mellitus type II   . Hyperlipidemia   . Hypertension   . Urethral stricture   . Kidney stones   . History of cardiac catheterization     a.Myoview 1/15:  There is significant left ventricular dysfunction. There may be slight scar at the apex. There is no significant ischemia. LV Ejection Fraction: 27%  //  b. RHC/LHC (1/15) with mean RA 6, PA 47/22 mean 33, mean PCWP 20, PVR 2.5 WU, CI 2.5; 80% dLAD stenosis, 70% diffuse large D.   . Chronic systolic heart failure ([facility])     a. ECHO (12/2012) EF 25-30%, HK entireanteroseptal myocardium //  b.  EF 25%, diffuse HK, grade 1 diastolic dysfunction, MAC, mild LAE, normal RVSF, trivial pericardial effusion  . Allergic rhinitis   . CKD (chronic kidney disease)   . Diabetic neuropathy ([facility])   . Diabetic nephropathy [facility])     Past Surgical History  Procedure Laterality Date  . Reapea urethral surgery for recurrent obstruction  2011  . Total knee arthroplasty  2007  . Left a nd right heart cath  01/30/2013    DR BENSIHMON  . Cardiac defibrillator placement  06/27/2013    Sub  Q       BY DR Graciela Husbands  . Left and right heart catheterization with coronary angiogram N/A 01/30/2013    Procedure: LEFT AND RIGHT HEART CATHETERIZATION WITH CORONARY ANGIOGRAM;  Surgeon: Dolores Patty, MD;  Location: [facility] CATH LAB;  Service: Cardiovascular;  Laterality: N/A;  . Implantable cardioverter defibrillator implant N/A 06/27/2013    Procedure: SUB Q ICD;  Surgeon: Duke Salvia, MD;  Location: [facility] CATH LAB;  Service: Cardiovascular;  Laterality: N/A;  . Bascilic vein transposition Right 08/22/2014    Procedure: RIGHT UPPER ARM BASCILIC VEIN TRANSPOSITION;  Surgeon: Chuck Hint, MD;  Location: [facility] OR;  Service: Vascular;  Laterality: Right;  . Peripheral vascular catheterization Right 01/26/2015    Procedure: A/V Fistulagram;  Surgeon: Chuck Hint, MD;  Location: [facility] INVASIVE CV LAB;  Service: Cardiovascular;  Laterality: Right;    There were no vitals filed for this visit.  Visit Diagnosis:  Right knee pain  Weakness  Stiffness of multiple joints  Debility      Subjective Assessment - 04/01/15 1321    Subjective Patient is pleased with overall progress and feels 100% better   Pertinent History CHF, CKD and diabetic neuropathy, HTN, R TKR 2007, cardiac defibrilator, Fistula R upper arm   Patient Stated Goals want to be able to write, drive and feed myself with  R hand. Need full use of R arm and leg.   Currently in Pain? No/denies            [facility] PT Assessment - 04/01/15 0001    AROM   Right/Left Knee Right   Right Knee Flexion 75   Strength   Overall Strength Deficits;Within functional limits for tasks performed   Right Hip Flexion 3-/5                     [facility] Adult PT Treatment/Exercise - 04/01/15 0001    Knee/Hip Exercises: Aerobic   Nustep Level 5 x 15 minutes LE only to UE/LE for activity tolerance   Knee/Hip Exercises: Machines for Strengthening   Cybex Knee Extension 10# 2 x 10   Cybex Knee Flexion 20# 3 x 10   Knee/Hip  Exercises: Standing   Rocker Board 4 minutes   Other Standing Knee Exercises resisted 4 way walking with pink XTS x fatigue   Knee/Hip Exercises: Seated   Long Arc Quad Strengthening;Right;3 sets;10 reps   Long Arc Quad Weight 4 lbs.                     PT Long Term Goals - 04/01/15 1329    PT LONG TERM GOAL #1   Title I with HEP   Time 4   Period Weeks   Status Achieved   PT LONG TERM GOAL #2   Title improved R knee flexion to 90 degrees to improve function   Time 8   Period Weeks   Status Not Met  AROM 75 degrees 04/01/15   PT LONG TERM GOAL #3   Title decreased pain in RLE to 3/10 with weightbearing   Time 8   Period Weeks   Status Achieved   PT LONG TERM GOAL #4   Title demo 4+/5 R hip flexor strength or better to asisst with patient returning to driving.   Time 8   Period Weeks   Status Not Met  -3/5 hip flexor strength 04/01/15   PT LONG TERM GOAL #5   Title improved R ankle active DF to neutral or better to improve gait.   Time 8   Period Weeks   Status Achieved   PT LONG TERM GOAL #6   Title Patient to report no falls or giving out of knee in 4 weeks previous.   Time 8   Period Weeks   Status Achieved               Plan - 04/01/15 1354    Clinical Impression Statement Patient has met all goals except #2 and #4 today. Patient AROM 75 degrees for right knee flexion and right hiup flexor strength -3/5. Patient feels 100% better overall and is independent with most ADL's and activities. Patient is to join gym program.    Pt will benefit from skilled therapeutic intervention in order to improve on the following deficits Difficulty walking;Impaired UE functional use;Decreased endurance;Decreased activity tolerance;Pain;Decreased range of motion;Decreased strength;Increased edema;Decreased mobility;Impaired flexibility;Decreased balance   PT Frequency 2x / week   PT Duration 8 weeks   PT Treatment/Interventions ADLs/Self Care Home  Management;Electrical Stimulation;Cryotherapy;Moist Heat;Ultrasound;Gait training;Therapeutic exercise;Manual techniques;Patient/family education;Neuromuscular re-education;Balance training;Passive range of motion;Dry needling;Vasopneumatic Device   PT Next Visit Plan DC   Consulted and Agree with Plan of Care Patient        Problem List Patient Active Problem List   Diagnosis Date Noted  . At high risk for  falls 09/29/2014  . Peripheral vascular disease (H[facility]) 09/29/2014  . Osteoarthritis of both knees 01/07/2014  . Osteoarthritis of multiple joints 10/06/2013  . NICM (nonischemic cardiomyopathy) ([facility]) 06/27/2013  . CAD (coronary artery disease) 03/13/2013  . Abnormal nuclear stress test 01/27/2013  . Chronic systolic congestive heart failure (HCC) 01/27/2013  . COPD (chronic obstructive pulmonary disease) (H[facility]) 12/27/2012  . Diabetic peripheral neuropathy (HCC) 02/10/2012  . ANEMIA, MILD 01/18/2010  . Chronic kidney disease (CKD), stage IV (severe) (HCC) 01/18/2010  . PYELITIS/PYELONEPHRITIS DISEASES CLASSIFIED ELSW 09/28/2009  . URINARY CALCULUS 09/28/2009  . ADJUSTMENT DISORDER WITH DEPRESSED MOOD 08/21/2009  . Poorly controlled type II diabetes mellitus with renal complication (HCC) 04/14/2008  . Hyperlipidemia 04/14/2008  . Essential hypertension 04/14/2008  . ALLERGIC RHINITIS 04/14/2008    Jayonna Meyering P, PTA  04/01/2015, 1:59 PM  Cathie Hoops, PTA 04/01/2015 1:59 PM  [facility] [address] [place], [place], [address] Phone: [phone]   Fax:  [phone]  Name: Anthony Bullock MRN: [id number] Date of Birth: 1960/07/25

## 2015-04-02 ENCOUNTER — Encounter (HOSPITAL_COMMUNITY)
Admission: RE | Admit: 2015-04-02 | Discharge: 2015-04-02 | Disposition: A | Discharge status: Home / Self Care | Payer: BLUE CROSS/BLUE SHIELD | Source: Ambulatory Visit | Attending: Nephrology | Admitting: Nephrology

## 2015-04-02 ENCOUNTER — Other Ambulatory Visit: Payer: Self-pay | Admitting: Family Medicine

## 2015-04-02 DIAGNOSIS — N183 Chronic kidney disease, stage 3 (moderate): Secondary | ICD-10-CM | POA: Diagnosis not present

## 2015-04-02 LAB — POCT HEMOGLOBIN-HEMACUE: Hemoglobin: 8.9 g/dL — ABNORMAL LOW (ref 13.0–17.0)

## 2015-04-02 MED ORDER — DARBEPOETIN ALFA 100 MCG/0.5ML IJ SOSY
PREFILLED_SYRINGE | INTRAMUSCULAR | Status: AC
  Filled 2015-04-02: qty 0.5

## 2015-04-02 MED ORDER — DARBEPOETIN ALFA 100 MCG/0.5ML IJ SOSY
100.0000 ug | PREFILLED_SYRINGE | INTRAMUSCULAR | Status: DC
  Administered 2015-04-02: 100 ug via SUBCUTANEOUS

## 2015-04-03 ENCOUNTER — Encounter: Payer: BLUE CROSS/BLUE SHIELD | Admitting: Physical Therapy

## 2015-04-07 ENCOUNTER — Encounter: Payer: Self-pay | Admitting: Vascular Surgery

## 2015-04-08 NOTE — Therapy (Addendum)
[facility] [place], [place], [address] Phone: [phone]   Fax:  [phone]  Physical Therapy Treatment  Patient Details  Name: Anthony Bullock MRN: [id number] Date of Birth: January 18, 1960 Referring Provider: Carolann Littler MD  Encounter Date: 04/01/2015    Past Medical History  Diagnosis Date  . NICM (nonischemic cardiomyopathy) ([place])     Primarily nonischemic. Echo (12/14) with EF 25-30%. Echo (3/15) with EF 25%, mild to moderately dilated LV, normal RV size and systolic function.   . Diabetes mellitus type II   . Hyperlipidemia   . Hypertension   . Urethral stricture   . Kidney stones   . History of cardiac catheterization     a.Myoview 1/15:  There is significant left ventricular dysfunction. There may be slight scar at the apex. There is no significant ischemia. LV Ejection Fraction: 27%  //  b. RHC/LHC (1/15) with mean RA 6, PA 47/22 mean 33, mean PCWP 20, PVR 2.5 WU, CI 2.5; 80% dLAD stenosis, 70% diffuse large D.   . Chronic systolic heart failure ([place])     a. ECHO (12/2012) EF 25-30%, HK entireanteroseptal myocardium //  b.  EF 25%, diffuse HK, grade 1 diastolic dysfunction, MAC, mild LAE, normal RVSF, trivial pericardial effusion  . Allergic rhinitis   . CKD (chronic kidney disease)   . Diabetic neuropathy ([place])   . Diabetic nephropathy [facility])     Past Surgical History  Procedure Laterality Date  . Reapea urethral surgery for recurrent obstruction  2011  . Total knee arthroplasty  2007  . Left a nd right heart cath  01/30/2013    DR BENSIHMON  . Cardiac defibrillator placement  06/27/2013    Sub Q       BY DR Caryl Comes  . Left and right heart catheterization with coronary angiogram N/A 01/30/2013    Procedure: LEFT AND RIGHT HEART CATHETERIZATION WITH CORONARY ANGIOGRAM;  Surgeon: Jolaine Artist, MD;  Location: [facility] CATH LAB;  Service: Cardiovascular;  Laterality: N/A;  . Implantable cardioverter defibrillator  implant N/A 06/27/2013    Procedure: SUB Q ICD;  Surgeon: Deboraha Sprang, MD;  Location: [facility] CATH LAB;  Service: Cardiovascular;  Laterality: N/A;  . Bascilic vein transposition Right 08/22/2014    Procedure: RIGHT UPPER ARM [place];  Surgeon: Angelia Mould, MD;  Location: [place];  Service: Vascular;  Laterality: Right;  . Peripheral vascular catheterization Right 01/26/2015    Procedure: A/V Fistulagram;  Surgeon: Angelia Mould, MD;  Location: [place] CV LAB;  Service: Cardiovascular;  Laterality: Right;    There were no vitals filed for this visit.  Visit Diagnosis:  Right knee pain  Weakness  Stiffness of multiple joints  Debility                                    PT Long Term Goals - 04/01/15 1329    PT LONG TERM GOAL #1   Title I with HEP   Time 4   Period Weeks   Status Achieved   PT LONG TERM GOAL #2   Title improved R knee flexion to 90 degrees to improve function   Time 8   Period Weeks   Status Not Met  AROM 75 degrees 04/01/15   PT LONG TERM GOAL #3   Title decreased pain in RLE to 3/10 with weightbearing   Time 8  Period Weeks   Status Achieved   PT LONG TERM GOAL #4   Title demo 4+/5 R hip flexor strength or better to asisst with patient returning to driving.   Time 8   Period Weeks   Status Not Met  -3/5 hip flexor strength 04/01/15   PT LONG TERM GOAL #5   Title improved R ankle active DF to neutral or better to improve gait.   Time 8   Period Weeks   Status Achieved   PT LONG TERM GOAL #6   Title Patient to report no falls or giving out of knee in 4 weeks previous.   Time 8   Period Weeks   Status Achieved               Problem List Patient Active Problem List   Diagnosis Date Noted  . At high risk for falls 09/29/2014  . Peripheral vascular disease ([place]) 09/29/2014  . Osteoarthritis of both knees 01/07/2014  . Osteoarthritis of multiple joints 10/06/2013  . NICM  (nonischemic cardiomyopathy) ([place]) 06/27/2013  . CAD (coronary artery disease) 03/13/2013  . Abnormal nuclear stress test 01/27/2013  . Chronic systolic congestive heart failure ([place]) 01/27/2013  . COPD (chronic obstructive pulmonary disease) ([place]) 12/27/2012  . Diabetic peripheral neuropathy ([place]) 02/10/2012  . ANEMIA, MILD 01/18/2010  . Chronic kidney disease (CKD), stage IV (severe) ([place]) 01/18/2010  . PYELITIS/PYELONEPHRITIS DISEASES CLASSIFIED ELSW 09/28/2009  . URINARY CALCULUS 09/28/2009  . ADJUSTMENT DISORDER WITH DEPRESSED MOOD 08/21/2009  . Poorly controlled type II diabetes mellitus with renal complication ([place]) 36/01/6578  . Hyperlipidemia 04/14/2008  . Essential hypertension 04/14/2008  . ALLERGIC RHINITIS 04/14/2008  PHYSICAL THERAPY DISCHARGE SUMMARY  Visits from Start of Care:   Current functional level related to goals / functional outcomes: Please see above.   Remaining deficits: Goals #2 and #4 unmet.   Education / Equipment: HEP.  Plan: Patient agrees to discharge.  Patient goals were partially met. Patient is being discharged due to being pleased with the current functional level.  ?????       [place], [place] MPT 04/08/2015, 6:15 PM  [facility] [address] [place], [place], [address] Phone: [phone]   Fax:  [phone]  Name: Anthony Bullock MRN: [id number] Date of Birth: March 31, 1960

## 2015-04-09 ENCOUNTER — Encounter (HOSPITAL_COMMUNITY)
Admission: RE | Admit: 2015-04-09 | Discharge: 2015-04-09 | Disposition: A | Discharge status: Home / Self Care | Payer: BLUE CROSS/BLUE SHIELD | Source: Ambulatory Visit | Attending: Nephrology | Admitting: Nephrology

## 2015-04-09 DIAGNOSIS — N183 Chronic kidney disease, stage 3 (moderate): Secondary | ICD-10-CM | POA: Diagnosis not present

## 2015-04-09 LAB — POCT HEMOGLOBIN-HEMACUE: Hemoglobin: 10.1 g/dL — ABNORMAL LOW (ref 13.0–17.0)

## 2015-04-09 MED ORDER — DARBEPOETIN ALFA 100 MCG/0.5ML IJ SOSY
PREFILLED_SYRINGE | INTRAMUSCULAR | Status: AC
  Administered 2015-04-09: 100 ug via SUBCUTANEOUS
  Filled 2015-04-09: qty 0.5

## 2015-04-09 MED ORDER — DARBEPOETIN ALFA 100 MCG/0.5ML IJ SOSY
100.0000 ug | PREFILLED_SYRINGE | INTRAMUSCULAR | Status: DC
  Administered 2015-04-09: 100 ug via SUBCUTANEOUS

## 2015-04-10 LAB — PTH, INTACT AND CALCIUM
Calcium, Total (PTH): 8.7 mg/dL (ref 8.7–10.2)
PTH: 78 pg/mL — ABNORMAL HIGH (ref 15–65)

## 2015-04-15 ENCOUNTER — Encounter: Payer: Self-pay | Admitting: Vascular Surgery

## 2015-04-15 ENCOUNTER — Ambulatory Visit (INDEPENDENT_AMBULATORY_CARE_PROVIDER_SITE_OTHER): Payer: BLUE CROSS/BLUE SHIELD | Admitting: Vascular Surgery

## 2015-04-15 ENCOUNTER — Ambulatory Visit (HOSPITAL_COMMUNITY)
Admission: RE | Admit: 2015-04-15 | Discharge: 2015-04-15 | Disposition: A | Discharge status: Home / Self Care | Payer: BLUE CROSS/BLUE SHIELD | Source: Ambulatory Visit | Attending: Vascular Surgery | Admitting: Vascular Surgery

## 2015-04-15 VITALS — BP 142/71 | HR 82 | Ht 71.0 in | Wt 220.0 lb

## 2015-04-15 DIAGNOSIS — N184 Chronic kidney disease, stage 4 (severe): Secondary | ICD-10-CM

## 2015-04-15 NOTE — Progress Notes (Signed)
Patient name: Anthony Bullock MRN: [id number] DOB: [birth year] Sex: male  REASON FOR VISIT: Follow up after AV fistula placement  HPI: Anthony Bullock is a 55 y.o. male who underwent a left basilic vein transposition on 08/22/2014. On 01/26/2015 I performed a fistulogram with venoplasty of a right basilic vein stenosis. He comes in for a follow up visit. He is not yet on dialysis. He has an appointment in June in [place] to be evaluated for a transplant. He has no pain or paresthesias in his right arm.  Current Outpatient Prescriptions  Medication Sig Dispense Refill  . aspirin EC 81 MG tablet Take 162 mg by mouth daily.    [name] atorvastatin (LIPITOR) 80 MG tablet Take 1 tablet (80 mg total) by mouth at bedtime. 90 tablet 3  . azelastine (ASTELIN) 0.1 % nasal spray Place 2 sprays into both nostrils 3 (three) times daily as needed for rhinitis. Use in each nostril as directed 30 mL 12  . BD PEN NEEDLE NANO U/F 32G X 4 MM MISC USE ONCE DAILY 90 each 2  . carvedilol (COREG) 25 MG tablet Take 1 tablet (25 mg total) by mouth 2 (two) times daily with a meal. 180 tablet 3  . Darbepoetin Alfa-Polysorbate (ARANESP, ALB FREE, SURECLICK IJ) Inject as directed. One injection into skin weekly for hemoglobin levels. Patient unsure of dosage.    . fenofibrate 160 MG tablet Take 1 tablet (160 mg total) by mouth daily. 90 tablet 3  . Ferrous Sulfate (IRON) 28 MG TABS Take 1 tablet by mouth daily.    . fexofenadine (ALLEGRA) 180 MG tablet Take 180 mg by mouth daily.    . furosemide (LASIX) 40 MG tablet Take 80 mg by mouth 2 (two) times daily. Patient takes 40 MG in the am and 40MG  in the pm    . gabapentin (NEURONTIN) 300 MG capsule Take 1 capsule (300 mg total) by mouth 3 (three) times daily. 270 capsule 3  . glucose blood test strip Check 1 time daily. E11.9 One Touch Ultra Blue Test Strips 100 each 3  . HYDROcodone-acetaminophen (NORCO/VICODIN) 5-325 MG tablet Take 1 tablet by mouth every 6 (six) hours as needed  for moderate pain. 30 tablet 0  . Insulin Glargine (LANTUS SOLOSTAR) 100 UNIT/ML Solostar Pen Inject 80 Units into the skin daily at 10 pm.    . insulin lispro (HUMALOG) 100 UNIT/ML injection Inject 0-14 Units into the skin 3 (three) times daily as needed for high blood sugar (per sliding scale).    . isosorbide-hydrALAZINE (BIDIL) 20-37.5 MG tablet Take 1 tablet by mouth 3 (three) times daily. 270 tablet 3  . Olopatadine HCl (PATADAY) 0.2 % SOLN Place 1 drop into both eyes daily as needed (for allergies).     . Vitamin D, Ergocalciferol, (DRISDOL) 50000 UNITS CAPS capsule Take 50,000 Units by mouth every 7 (seven) days.      No current facility-administered medications for this visit.    REVIEW OF SYSTEMS:  [X]  denotes positive finding, [ ]  denotes negative finding Cardiac  Comments:  Chest pain or chest pressure:    Shortness of breath upon exertion:    Short of breath when lying flat:    Irregular heart rhythm:    Constitutional    Fever or chills:      PHYSICAL EXAM: Filed Vitals:   04/15/15 1000 04/15/15 1001  BP: 144/71 142/71  Pulse: 82   Height: 5\' 11"  (1.803 m)   Weight: 220 lb (99.791  kg)   SpO2: 98%     GENERAL: The patient is a well-nourished male, in no acute distress. The vital signs are documented above. CARDIOVASCULAR: There is a regular rate and rhythm. PULMONARY: There is good air exchange bilaterally without wheezing or rales. His fistula has a good bruit and thrill although it is not especially large.  DUPLEX AV FISTULA: I have independently interpreted his duplex of his AV fistula. This shows that the diameters range from 0.12/0.6 cm. There is an area of stenosis in the proximal fistula and also an area in the distal fistula.  MEDICAL ISSUES:   STATUS POST RIGHT BRACHIOCEPHALIC AV FISTULA: He has a defibrillator on the left side and therefore I do not want to place access in the left arm. His best vein for access in the right arm was his basilic vein if  this is not maturing adequately and has 2 areas of stenosis. As he is not yet on dialysis, I am reluctant to place a graft at this time. Therefore, I think it would be reasonable to attempt venoplasty of the 2 areas of stenosis seen on his duplex today. He would like to have this done after his evaluation in [place]. I think this is reasonable. If we are not able to salvage the fistula, then he would need to have a graft placed in the right arm given that he has a defibrillator on the left. He will call us after his evaluation in [place] to arrange for his fistulogram and venoplasty.    [name] [facility]: [phone]

## 2015-04-23 ENCOUNTER — Encounter (HOSPITAL_COMMUNITY)
Admission: RE | Admit: 2015-04-23 | Discharge: 2015-04-23 | Disposition: A | Discharge status: Home / Self Care | Payer: BLUE CROSS/BLUE SHIELD | Source: Ambulatory Visit | Attending: Nephrology | Admitting: Nephrology

## 2015-04-23 DIAGNOSIS — N183 Chronic kidney disease, stage 3 (moderate): Secondary | ICD-10-CM | POA: Diagnosis present

## 2015-04-23 DIAGNOSIS — Z5181 Encounter for therapeutic drug level monitoring: Secondary | ICD-10-CM | POA: Diagnosis not present

## 2015-04-23 DIAGNOSIS — D509 Iron deficiency anemia, unspecified: Secondary | ICD-10-CM | POA: Insufficient documentation

## 2015-04-23 DIAGNOSIS — Z79899 Other long term (current) drug therapy: Secondary | ICD-10-CM | POA: Insufficient documentation

## 2015-04-23 DIAGNOSIS — D631 Anemia in chronic kidney disease: Secondary | ICD-10-CM | POA: Diagnosis not present

## 2015-04-23 LAB — IRON AND TIBC
Iron: 38 ug/dL — ABNORMAL LOW (ref 45–182)
Saturation Ratios: 9 % — ABNORMAL LOW (ref 17.9–39.5)
TIBC: 413 ug/dL (ref 250–450)
UIBC: 375 ug/dL

## 2015-04-23 LAB — RENAL FUNCTION PANEL
Albumin: 2.4 g/dL — ABNORMAL LOW (ref 3.5–5.0)
Anion gap: 14 (ref 5–15)
BUN: 98 mg/dL — ABNORMAL HIGH (ref 6–20)
CO2: 18 mmol/L — ABNORMAL LOW (ref 22–32)
Calcium: 7.7 mg/dL — ABNORMAL LOW (ref 8.9–10.3)
Chloride: 106 mmol/L (ref 101–111)
Creatinine, Ser: 8.2 mg/dL — ABNORMAL HIGH (ref 0.61–1.24)
GFR calc Af Amer: 8 mL/min — ABNORMAL LOW (ref >60)
GFR calc non Af Amer: 7 mL/min — ABNORMAL LOW (ref >60)
Glucose, Bld: 304 mg/dL — ABNORMAL HIGH (ref 65–99)
Phosphorus: 10.2 mg/dL — ABNORMAL HIGH (ref 2.5–4.6)
Potassium: 4.3 mmol/L (ref 3.5–5.1)
Sodium: 138 mmol/L (ref 135–145)

## 2015-04-23 LAB — FERRITIN: Ferritin: 56 ng/mL (ref 24–336)

## 2015-04-23 MED ORDER — DARBEPOETIN ALFA 100 MCG/0.5ML IJ SOSY
PREFILLED_SYRINGE | INTRAMUSCULAR | Status: AC
  Filled 2015-04-23: qty 0.5

## 2015-04-23 MED ORDER — DARBEPOETIN ALFA 100 MCG/0.5ML IJ SOSY
100.0000 ug | PREFILLED_SYRINGE | INTRAMUSCULAR | Status: DC
  Administered 2015-04-23: 100 ug via SUBCUTANEOUS

## 2015-04-24 LAB — POCT HEMOGLOBIN-HEMACUE: Hemoglobin: 9.6 g/dL — ABNORMAL LOW (ref 13.0–17.0)

## 2015-04-24 LAB — HEPATITIS B SURFACE ANTIGEN: Hepatitis B Surface Ag: NEGATIVE

## 2015-04-29 ENCOUNTER — Ambulatory Visit (INDEPENDENT_AMBULATORY_CARE_PROVIDER_SITE_OTHER): Payer: BLUE CROSS/BLUE SHIELD | Admitting: Family Medicine

## 2015-04-29 VITALS — BP 140/70 | HR 75 | Temp 97.8°F | Ht 71.0 in | Wt 218.9 lb

## 2015-04-29 DIAGNOSIS — E1165 Type 2 diabetes mellitus with hyperglycemia: Secondary | ICD-10-CM

## 2015-04-29 DIAGNOSIS — I10 Essential (primary) hypertension: Secondary | ICD-10-CM

## 2015-04-29 DIAGNOSIS — Z111 Encounter for screening for respiratory tuberculosis: Secondary | ICD-10-CM

## 2015-04-29 DIAGNOSIS — E1129 Type 2 diabetes mellitus with other diabetic kidney complication: Secondary | ICD-10-CM

## 2015-04-29 LAB — POCT GLYCOSYLATED HEMOGLOBIN (HGB A1C): Hemoglobin A1C: 8.8

## 2015-04-29 MED ORDER — TUBERCULIN PPD 5 UNIT/0.1ML ID SOLN
5.0000 [IU] | Freq: Once | INTRADERMAL | Status: AC
  Administered 2015-04-29: 5 [IU] via INTRADERMAL

## 2015-04-29 MED ORDER — HYDROCODONE-ACETAMINOPHEN 5-325 MG PO TABS: 1.0000 | ORAL_TABLET | Freq: Four times a day (QID) | ORAL | Status: DC | PRN

## 2015-04-29 NOTE — Progress Notes (Addendum)
Subjective:    Patient ID: Anthony Bullock, male    DOB: [birth year], 55 y.o.   MRN: [id number]  HPI Here for medical follow up. Chronic problems:  Type 2 diabetes-poor control with neuropathy and nephropathy and poor compliance, hypertension, PVD, osteoarthritis multiple joints, nonischemic cardiomyopathy, COPD, CKD-followed by nephrology.  He is looking into possibility to kidney transplant.  He states he was "turned down" by [facility].  He has pending appt at [facility].  Needs to get screening colonoscopy and PPD first.  No prior hx of positive PPD reaction and has not had prior colonoscopy.  Followed actively by cardiology and nephrology.  His CHF appears to be well compensated currently. Edema improved.  Ambulating better after PT.  History of bilateral foot drop and severe neuropathy-likely on basis of diabetes.  Long hx of poor compliance with diabetes- both with diet and medications.  His wife has stepped in and is helping to enforce better compliance  Currently taking Lantus 40 units once daily.  They had to decrease dose as he had occasional hypoglycemia on higher dose.  Currently not taking any Humalog.    Past Medical History  Diagnosis Date  . NICM (nonischemic cardiomyopathy) ([place])     Primarily nonischemic. Echo (12/14) with EF 25-30%. Echo (3/15) with EF 25%, mild to moderately dilated LV, normal RV size and systolic function.   . Diabetes mellitus type II   . Hyperlipidemia   . Hypertension   . Urethral stricture   . Kidney stones   . History of cardiac catheterization     a.Myoview 1/15:  There is significant left ventricular dysfunction. There may be slight scar at the apex. There is no significant ischemia. LV Ejection Fraction: 27%  //  b. RHC/LHC (1/15) with mean RA 6, PA 47/22 mean 33, mean PCWP 20, PVR 2.5 WU, CI 2.5; 80% dLAD stenosis, 70% diffuse large D.   . Chronic systolic heart failure ([place])     a. ECHO (12/2012) EF 25-30%, HK entireanteroseptal myocardium //   b.  EF 25%, diffuse HK, grade 1 diastolic dysfunction, MAC, mild LAE, normal RVSF, trivial pericardial effusion  . Allergic rhinitis   . CKD (chronic kidney disease)   . Diabetic neuropathy ([place])   . Diabetic nephropathy [facility])    Past Surgical History  Procedure Laterality Date  . Reapea urethral surgery for recurrent obstruction  2011  . Total knee arthroplasty  2007  . Left a nd right heart cath  01/30/2013    DR BENSIHMON  . Cardiac defibrillator placement  06/27/2013    Sub Q       BY DR Caryl Comes  . Left and right heart catheterization with coronary angiogram N/A 01/30/2013    Procedure: LEFT AND RIGHT HEART CATHETERIZATION WITH CORONARY ANGIOGRAM;  Surgeon: Jolaine Artist, MD;  Location: [facility] CATH LAB;  Service: Cardiovascular;  Laterality: N/A;  . Implantable cardioverter defibrillator implant N/A 06/27/2013    Procedure: SUB Q ICD;  Surgeon: Deboraha Sprang, MD;  Location: [facility] CATH LAB;  Service: Cardiovascular;  Laterality: N/A;  . Bascilic vein transposition Right 08/22/2014    Procedure: RIGHT UPPER ARM [place];  Surgeon: Angelia Mould, MD;  Location: [place];  Service: Vascular;  Laterality: Right;  . Peripheral vascular catheterization Right 01/26/2015    Procedure: A/V Fistulagram;  Surgeon: Angelia Mould, MD;  Location: [place] CV LAB;  Service: Cardiovascular;  Laterality: Right;    reports that he quit smoking about 4  years ago. His smoking use included Cigarettes. He has a 64 pack-year smoking history. He has never used smokeless tobacco. He reports that he does not drink alcohol or use illicit drugs. family history includes Alcohol abuse in his father; Cancer in his father and mother; Diabetes in his other; Heart Problems in his maternal grandmother; Heart disease in his maternal grandfather; Prostate cancer in his maternal grandfather; Stroke in his maternal grandmother. Allergies  Allergen Reactions  . Morphine Sulfate Rash    REACTION:  Itches all over, red spots      Review of Systems  Constitutional: Negative for fever and chills.  Respiratory: Negative for cough.   Cardiovascular: Negative for chest pain and palpitations.  Gastrointestinal: Negative for abdominal pain.  Endocrine: Negative for polydipsia and polyuria.  Genitourinary: Negative for dysuria.  Hematological: Negative for adenopathy.       Objective:   Physical Exam  Constitutional: He is oriented to person, place, and time. He appears well-developed and well-nourished.  Neck: Neck supple. No thyromegaly present.  Cardiovascular: Normal rate and regular rhythm.  Exam reveals no gallop.   Pulmonary/Chest: Breath sounds normal. No respiratory distress. He has no wheezes. He has no rales.  Genitourinary:  No obvious inguinal hernia.   Musculoskeletal:  Only trace edema lower legs bilaterally.  Neurological: He is alert and oriented to person, place, and time.          Assessment & Plan:  #1 Chronic end stage kidney disease.  He is looking at getting transplant as above. Needs PPD (placed and will return in 48 hours for read) and colonoscopy- scheduling.  #2 Type 2 diabetes.  Hx of poor control and poor compliance.  Dietary compliance improving.  Continue with Lantus 40 units once daily and gave them sliding scale to use at meals TID for Humalog.  Reviewed signs and symptoms of hypoglycemia.  Stressed importance of improved diabetes control if he is to be considered for transplant.

## 2015-04-29 NOTE — Progress Notes (Signed)
Pre visit review using our clinic review tool, if applicable. No additional management support is needed unless otherwise documented below in the visit note. 

## 2015-04-29 NOTE — Addendum Note (Signed)
Addended by: [name] on: 04/29/2015 11:02 AM   Modules accepted: Orders, Level of Service

## 2015-04-29 NOTE — Patient Instructions (Signed)
Continue with Lantus 40 units once daily  Use Humalog THREE times daily with meals as follows:  Pre-meal glucose                        Units of Humalog  <70                                                 0 70-130                                            0 131-180                                          2 181-240                                          4 241-300                                          6 301-350                                          8 351-400                                         10 >400                                              12 and call MD

## 2015-04-29 NOTE — Addendum Note (Signed)
Addended by: Elio Forget on: 04/29/2015 11:15 AM   Modules accepted: Orders

## 2015-04-30 ENCOUNTER — Ambulatory Visit (HOSPITAL_COMMUNITY)
Admission: RE | Admit: 2015-04-30 | Discharge: 2015-04-30 | Disposition: A | Discharge status: Home / Self Care | Payer: BLUE CROSS/BLUE SHIELD | Source: Ambulatory Visit | Attending: Nephrology | Admitting: Nephrology

## 2015-04-30 DIAGNOSIS — N183 Chronic kidney disease, stage 3 (moderate): Secondary | ICD-10-CM | POA: Diagnosis not present

## 2015-04-30 LAB — POCT HEMOGLOBIN-HEMACUE: Hemoglobin: 9.8 g/dL — ABNORMAL LOW (ref 13.0–17.0)

## 2015-04-30 MED ORDER — DARBEPOETIN ALFA 100 MCG/0.5ML IJ SOSY
PREFILLED_SYRINGE | INTRAMUSCULAR | Status: AC
  Administered 2015-04-30: 100 ug
  Filled 2015-04-30: qty 0.5

## 2015-04-30 MED ORDER — DARBEPOETIN ALFA 100 MCG/0.5ML IJ SOSY: 100.0000 ug | PREFILLED_SYRINGE | INTRAMUSCULAR | Status: DC

## 2015-05-01 ENCOUNTER — Ambulatory Visit: Payer: BLUE CROSS/BLUE SHIELD | Admitting: Cardiology

## 2015-05-01 ENCOUNTER — Encounter: Payer: Self-pay | Admitting: *Deleted

## 2015-05-01 NOTE — Addendum Note (Signed)
Addended by: [name] on: 05/01/2015 06:40 AM   Modules accepted: Medications

## 2015-05-07 ENCOUNTER — Encounter (HOSPITAL_COMMUNITY)
Admission: RE | Admit: 2015-05-07 | Discharge: 2015-05-07 | Disposition: A | Discharge status: Home / Self Care | Payer: BLUE CROSS/BLUE SHIELD | Source: Ambulatory Visit | Attending: Nephrology | Admitting: Nephrology

## 2015-05-07 DIAGNOSIS — N183 Chronic kidney disease, stage 3 (moderate): Secondary | ICD-10-CM | POA: Diagnosis not present

## 2015-05-07 LAB — POCT HEMOGLOBIN-HEMACUE: Hemoglobin: 10.1 g/dL — ABNORMAL LOW (ref 13.0–17.0)

## 2015-05-07 MED ORDER — DARBEPOETIN ALFA 100 MCG/0.5ML IJ SOSY
PREFILLED_SYRINGE | INTRAMUSCULAR | Status: AC
  Filled 2015-05-07: qty 0.5

## 2015-05-07 MED ORDER — DARBEPOETIN ALFA 100 MCG/0.5ML IJ SOSY
100.0000 ug | PREFILLED_SYRINGE | INTRAMUSCULAR | Status: DC
  Administered 2015-05-07: 100 ug via SUBCUTANEOUS

## 2015-05-08 LAB — PTH, INTACT AND CALCIUM
Calcium, Total (PTH): 9 mg/dL (ref 8.7–10.2)
PTH: 53 pg/mL (ref 15–65)

## 2015-05-14 ENCOUNTER — Encounter (HOSPITAL_COMMUNITY)
Admission: RE | Admit: 2015-05-14 | Discharge: 2015-05-14 | Disposition: A | Discharge status: Home / Self Care | Payer: BLUE CROSS/BLUE SHIELD | Source: Ambulatory Visit | Attending: Nephrology | Admitting: Nephrology

## 2015-05-14 DIAGNOSIS — D631 Anemia in chronic kidney disease: Secondary | ICD-10-CM | POA: Diagnosis not present

## 2015-05-14 DIAGNOSIS — Z79899 Other long term (current) drug therapy: Secondary | ICD-10-CM | POA: Insufficient documentation

## 2015-05-14 DIAGNOSIS — Z5181 Encounter for therapeutic drug level monitoring: Secondary | ICD-10-CM | POA: Diagnosis not present

## 2015-05-14 DIAGNOSIS — N183 Chronic kidney disease, stage 3 (moderate): Secondary | ICD-10-CM | POA: Diagnosis not present

## 2015-05-14 DIAGNOSIS — D509 Iron deficiency anemia, unspecified: Secondary | ICD-10-CM | POA: Insufficient documentation

## 2015-05-14 LAB — POCT HEMOGLOBIN-HEMACUE: Hemoglobin: 10.4 g/dL — ABNORMAL LOW (ref 13.0–17.0)

## 2015-05-14 MED ORDER — DARBEPOETIN ALFA 100 MCG/0.5ML IJ SOSY
100.0000 ug | PREFILLED_SYRINGE | INTRAMUSCULAR | Status: DC
  Administered 2015-05-14: 100 ug via SUBCUTANEOUS

## 2015-05-14 MED ORDER — DARBEPOETIN ALFA 100 MCG/0.5ML IJ SOSY
PREFILLED_SYRINGE | INTRAMUSCULAR | Status: AC
  Filled 2015-05-14: qty 0.5

## 2015-05-21 ENCOUNTER — Encounter (HOSPITAL_COMMUNITY)
Admission: RE | Admit: 2015-05-21 | Discharge: 2015-05-21 | Disposition: A | Discharge status: Home / Self Care | Payer: BLUE CROSS/BLUE SHIELD | Source: Ambulatory Visit | Attending: Nephrology | Admitting: Nephrology

## 2015-05-21 DIAGNOSIS — N183 Chronic kidney disease, stage 3 (moderate): Secondary | ICD-10-CM | POA: Diagnosis not present

## 2015-05-21 LAB — RENAL FUNCTION PANEL
Albumin: 2.6 g/dL — ABNORMAL LOW (ref 3.5–5.0)
Anion gap: 14 (ref 5–15)
BUN: 91 mg/dL — ABNORMAL HIGH (ref 6–20)
CO2: 22 mmol/L (ref 22–32)
Calcium: 9.5 mg/dL (ref 8.9–10.3)
Chloride: 101 mmol/L (ref 101–111)
Creatinine, Ser: 8.3 mg/dL — ABNORMAL HIGH (ref 0.61–1.24)
GFR calc Af Amer: 8 mL/min — ABNORMAL LOW (ref >60)
GFR calc non Af Amer: 7 mL/min — ABNORMAL LOW (ref >60)
Glucose, Bld: 335 mg/dL — ABNORMAL HIGH (ref 65–99)
Phosphorus: 8.7 mg/dL — ABNORMAL HIGH (ref 2.5–4.6)
Potassium: 3.9 mmol/L (ref 3.5–5.1)
Sodium: 137 mmol/L (ref 135–145)

## 2015-05-21 LAB — IRON AND TIBC
Iron: 185 ug/dL — ABNORMAL HIGH (ref 45–182)
Saturation Ratios: 45 % — ABNORMAL HIGH (ref 17.9–39.5)
TIBC: 412 ug/dL (ref 250–450)
UIBC: 227 ug/dL

## 2015-05-21 LAB — POCT HEMOGLOBIN-HEMACUE: Hemoglobin: 11.3 g/dL — ABNORMAL LOW (ref 13.0–17.0)

## 2015-05-21 LAB — FERRITIN: Ferritin: 36 ng/mL (ref 24–336)

## 2015-05-21 MED ORDER — DARBEPOETIN ALFA 100 MCG/0.5ML IJ SOSY
PREFILLED_SYRINGE | INTRAMUSCULAR | Status: AC
  Filled 2015-05-21: qty 0.5

## 2015-05-21 MED ORDER — DARBEPOETIN ALFA 100 MCG/0.5ML IJ SOSY
100.0000 ug | PREFILLED_SYRINGE | INTRAMUSCULAR | Status: DC
  Administered 2015-05-21: 100 ug via SUBCUTANEOUS

## 2015-05-22 LAB — HEPATITIS B SURFACE ANTIGEN: Hepatitis B Surface Ag: NEGATIVE

## 2015-05-25 ENCOUNTER — Ambulatory Visit (INDEPENDENT_AMBULATORY_CARE_PROVIDER_SITE_OTHER): Payer: BLUE CROSS/BLUE SHIELD | Admitting: Family Medicine

## 2015-05-25 ENCOUNTER — Encounter: Payer: Self-pay | Admitting: Family Medicine

## 2015-05-25 VITALS — BP 114/70 | HR 91 | Temp 98.6°F | Ht 71.0 in | Wt 218.0 lb

## 2015-05-25 DIAGNOSIS — E1129 Type 2 diabetes mellitus with other diabetic kidney complication: Secondary | ICD-10-CM | POA: Diagnosis not present

## 2015-05-25 DIAGNOSIS — E1165 Type 2 diabetes mellitus with hyperglycemia: Secondary | ICD-10-CM

## 2015-05-25 DIAGNOSIS — S90221A Contusion of right lesser toe(s) with damage to nail, initial encounter: Secondary | ICD-10-CM | POA: Diagnosis not present

## 2015-05-25 DIAGNOSIS — Z1211 Encounter for screening for malignant neoplasm of colon: Secondary | ICD-10-CM | POA: Diagnosis not present

## 2015-05-25 NOTE — Progress Notes (Signed)
Subjective:    Patient ID: Anthony Bullock, male    DOB: [birth year], 55 y.o.   MRN: [id number]  HPI   Patient seen with subungual hematoma right fifth toe He does not recall any trauma. Wife first noted this about a week ago when she went to trim nails. They have not noted any surrounding erythema. He does have history of peripheral neuropathy and impaired sensation in   both feet.  Type 2 diabetes with history of poor control. We recently gave sliding scale   insulin His fasting sugars have been consistently fairly high. No recent hypoglycemia.  Patient has chronic kidney disease and is looking at possible renal         transplant through program at [facility].   He needs to get screening  colonoscopy before they will consider him  Past Medical History  Diagnosis Date  . NICM (nonischemic cardiomyopathy) ([place])     Primarily nonischemic. Echo (12/14) with EF 25-30%. Echo (3/15) with EF 25%, mild to moderately dilated LV, normal RV size and systolic function.   . Diabetes mellitus type II   . Hyperlipidemia   . Hypertension   . Urethral stricture   . Kidney stones   . History of cardiac catheterization     a.Myoview 1/15:  There is significant left ventricular dysfunction. There may be slight scar at the apex. There is no significant ischemia. LV Ejection Fraction: 27%  //  b. RHC/LHC (1/15) with mean RA 6, PA 47/22 mean 33, mean PCWP 20, PVR 2.5 WU, CI 2.5; 80% dLAD stenosis, 70% diffuse large D.   . Chronic systolic heart failure ([place])     a. ECHO (12/2012) EF 25-30%, HK entireanteroseptal myocardium //  b.  EF 25%, diffuse HK, grade 1 diastolic dysfunction, MAC, mild LAE, normal RVSF, trivial pericardial effusion  . Allergic rhinitis   . CKD (chronic kidney disease)   . Diabetic neuropathy ([place])   . Diabetic nephropathy [facility])    Past Surgical History  Procedure Laterality Date  . Reapea urethral surgery for recurrent obstruction  2011  . Total knee arthroplasty  2007   . Left a nd right heart cath  01/30/2013    DR BENSIHMON  . Cardiac defibrillator placement  06/27/2013    Sub Q       BY DR Caryl Comes  . Left and right heart catheterization with coronary angiogram N/A 01/30/2013    Procedure: LEFT AND RIGHT HEART CATHETERIZATION WITH CORONARY ANGIOGRAM;  Surgeon: Jolaine Artist, MD;  Location: [facility] CATH LAB;  Service: Cardiovascular;  Laterality: N/A;  . Implantable cardioverter defibrillator implant N/A 06/27/2013    Procedure: SUB Q ICD;  Surgeon: Deboraha Sprang, MD;  Location: [facility] CATH LAB;  Service: Cardiovascular;  Laterality: N/A;  . Bascilic vein transposition Right 08/22/2014    Procedure: RIGHT UPPER ARM [place];  Surgeon: Angelia Mould, MD;  Location: [place];  Service: Vascular;  Laterality: Right;  . Peripheral vascular catheterization Right 01/26/2015    Procedure: A/V Fistulagram;  Surgeon: Angelia Mould, MD;  Location: [place] CV LAB;  Service: Cardiovascular;  Laterality: Right;    reports that he quit smoking about 5 years ago. His smoking use included Cigarettes. He has a 64 pack-year smoking history. He has never used smokeless tobacco. He reports that he does not drink alcohol or use illicit drugs. family history includes Alcohol abuse in his father; Cancer in his father and mother; Diabetes in his other;  Heart Problems in his maternal grandmother; Heart disease in his maternal grandfather; Prostate cancer in his maternal grandfather; Stroke in his maternal grandmother. Allergies  Allergen Reactions  . Morphine Sulfate Rash    REACTION: Itches all over, red spots      Review of Systems  Constitutional: Negative for fever, chills, appetite change and unexpected weight change.  Respiratory: Negative for shortness of breath.   Cardiovascular: Negative for chest pain, palpitations and leg swelling.  Endocrine: Negative for polydipsia and polyuria.  Musculoskeletal: Negative for gait problem.    Psychiatric/Behavioral: Negative for dysphoric mood.       Objective:   Physical Exam  Constitutional: He appears well-developed and well-nourished.  Cardiovascular: Normal rate and regular rhythm.   Pulmonary/Chest: Breath sounds normal. No respiratory distress. He has no wheezes. He has no rales.  Musculoskeletal:  Right fifth toe reveals subungual hematoma. Nail appears to be solidly attached. No signs of secondary infection. No erythema or warmth. Nontender palpation          Assessment & Plan:   #1 subungual hematoma right fifth toe. No reported history of trauma. No signs of secondary infection. Observe for now. Follow-up promptly for signs of secondary infection-reviewed with patient and wife.   #2 chronic kidney disease. Patient looking at renal transplant program at [facility]. They're requiring screening colonoscopy before he'll be considered.  Will set up.  #3 poorly controlled Type 2 diabetes with complications of peripheral neuropathy and end stage kidney failure.  Continue with sliding scale meal insulin.  Slowly titrate Lantus 2 units every 3 days until fasting sugars are consistently around 130 or less.  [name] MD [facility]

## 2015-05-25 NOTE — Progress Notes (Signed)
Pre visit review using our clinic review tool, if applicable. No additional management support is needed unless otherwise documented below in the visit note. 

## 2015-05-25 NOTE — Patient Instructions (Signed)
Subungual Hematoma A subungual hematoma is a pocket of blood that collects under the fingernail or toenail. The pressure created by the blood under the nail can cause pain. CAUSES  A subungual hematoma occurs when an injury to the finger or toe causes a blood vessel beneath the nail to break. The injury can occur from a direct blow such as slamming a finger in a door. It can also occur from a repeated injury such as pressure on the foot in a shoe while running. A subungual hematoma is sometimes called runner's toe or tennis toe. SYMPTOMS   Blue or dark blue skin under the nail.  Pain or throbbing in the injured area. DIAGNOSIS  Your caregiver can determine whether you have a subungual hematoma based on your history and a physical exam. If your caregiver thinks you might have a broken (fractured) bone, X-rays may be taken. TREATMENT  Hematomas usually go away on their own over time. Your caregiver may make a hole in the nail to drain the blood. Draining the blood is painless and usually provides significant relief from pain and throbbing. The nail usually grows back normally after this procedure. In some cases, the nail may need to be removed. This is done if there is a cut under the nail that requires stitches (sutures). HOME CARE INSTRUCTIONS   Put ice on the injured area.  Put ice in a plastic bag.  Place a towel between your skin and the bag.  Leave the ice on for 15-20 minutes, 03-04 times a day for the first 1 to 2 days.  Elevate the injured area to help decrease pain and swelling.  If you were given a bandage, wear it for as long as directed by your caregiver.  If part of your nail falls off, trim the remaining nail gently. This prevents the nail from catching on something and causing further injury.  Only take over-the-counter or prescription medicines for pain, discomfort, or fever as directed by your caregiver. SEEK IMMEDIATE MEDICAL CARE IF:   You have redness or swelling  around the nail.  You have yellowish-white fluid (pus) coming from the nail.  Your pain is not controlled with medicine.  You have a fever. MAKE SURE YOU:  Understand these instructions.  Will watch your condition.  Will get help right away if you are not doing well or get worse.   This information is not intended to replace advice given to you by your health care provider. Make sure you discuss any questions you have with your health care provider.   Document Released: 12/25/1999 Document Revised: 03/21/2011 Document Reviewed: 05/14/2014 Elsevier Interactive Patient Education [employer].

## 2015-05-27 ENCOUNTER — Encounter: Payer: Self-pay | Admitting: Gastroenterology

## 2015-05-28 ENCOUNTER — Encounter (HOSPITAL_COMMUNITY)
Admission: RE | Admit: 2015-05-28 | Discharge: 2015-05-28 | Disposition: A | Discharge status: Home / Self Care | Payer: BLUE CROSS/BLUE SHIELD | Source: Ambulatory Visit | Attending: Nephrology | Admitting: Nephrology

## 2015-05-28 DIAGNOSIS — N183 Chronic kidney disease, stage 3 (moderate): Secondary | ICD-10-CM | POA: Diagnosis not present

## 2015-05-28 LAB — POCT HEMOGLOBIN-HEMACUE: Hemoglobin: 10.6 g/dL — ABNORMAL LOW (ref 13.0–17.0)

## 2015-05-28 MED ORDER — DARBEPOETIN ALFA 100 MCG/0.5ML IJ SOSY
100.0000 ug | PREFILLED_SYRINGE | INTRAMUSCULAR | Status: DC
  Administered 2015-05-28: 100 ug via SUBCUTANEOUS

## 2015-05-28 MED ORDER — DARBEPOETIN ALFA 100 MCG/0.5ML IJ SOSY
PREFILLED_SYRINGE | INTRAMUSCULAR | Status: AC
  Filled 2015-05-28: qty 0.5

## 2015-05-29 LAB — HEPATITIS B SURFACE ANTIGEN: Hepatitis B Surface Ag: NEGATIVE

## 2015-06-04 ENCOUNTER — Encounter (HOSPITAL_COMMUNITY)
Admission: RE | Admit: 2015-06-04 | Discharge: 2015-06-04 | Disposition: A | Discharge status: Home / Self Care | Payer: BLUE CROSS/BLUE SHIELD | Source: Ambulatory Visit | Attending: Nephrology | Admitting: Nephrology

## 2015-06-04 DIAGNOSIS — N183 Chronic kidney disease, stage 3 (moderate): Secondary | ICD-10-CM | POA: Diagnosis not present

## 2015-06-04 LAB — POCT HEMOGLOBIN-HEMACUE: Hemoglobin: 11.6 g/dL — ABNORMAL LOW (ref 13.0–17.0)

## 2015-06-04 MED ORDER — DARBEPOETIN ALFA 100 MCG/0.5ML IJ SOSY
100.0000 ug | PREFILLED_SYRINGE | INTRAMUSCULAR | Status: DC
Start: 2015-06-04 — End: 2015-06-05

## 2015-06-04 MED ORDER — DARBEPOETIN ALFA 100 MCG/0.5ML IJ SOSY
PREFILLED_SYRINGE | INTRAMUSCULAR | Status: AC
  Filled 2015-06-04: qty 0.5

## 2015-06-05 LAB — PTH, INTACT AND CALCIUM
Calcium, Total (PTH): 13.6 mg/dL (ref 8.7–10.2)
PTH: 24 pg/mL (ref 15–65)

## 2015-06-10 ENCOUNTER — Encounter: Payer: BLUE CROSS/BLUE SHIELD | Admitting: Gastroenterology

## 2015-06-11 ENCOUNTER — Telehealth: Payer: Self-pay | Admitting: Family Medicine

## 2015-06-11 NOTE — Telephone Encounter (Signed)
Wife call to say that her husband will not be able to get his coloscopy before his visit on 06/23/15 in [place] Would like a call back

## 2015-06-15 NOTE — Telephone Encounter (Signed)
Looks like pt has a PREVISIT on 06/18/2015 to discuss.

## 2015-06-18 ENCOUNTER — Encounter (HOSPITAL_COMMUNITY)
Admission: RE | Admit: 2015-06-18 | Discharge: 2015-06-18 | Disposition: A | Discharge status: Home / Self Care | Payer: BLUE CROSS/BLUE SHIELD | Source: Ambulatory Visit | Attending: Nephrology | Admitting: Nephrology

## 2015-06-18 ENCOUNTER — Ambulatory Visit (INDEPENDENT_AMBULATORY_CARE_PROVIDER_SITE_OTHER): Payer: BLUE CROSS/BLUE SHIELD | Admitting: Gastroenterology

## 2015-06-18 ENCOUNTER — Encounter: Payer: Self-pay | Admitting: Gastroenterology

## 2015-06-18 VITALS — BP 118/60 | HR 84 | Ht 70.0 in | Wt 214.4 lb

## 2015-06-18 DIAGNOSIS — Z79899 Other long term (current) drug therapy: Secondary | ICD-10-CM | POA: Diagnosis not present

## 2015-06-18 DIAGNOSIS — Z5181 Encounter for therapeutic drug level monitoring: Secondary | ICD-10-CM | POA: Insufficient documentation

## 2015-06-18 DIAGNOSIS — Z1211 Encounter for screening for malignant neoplasm of colon: Secondary | ICD-10-CM | POA: Diagnosis not present

## 2015-06-18 DIAGNOSIS — D509 Iron deficiency anemia, unspecified: Secondary | ICD-10-CM | POA: Diagnosis not present

## 2015-06-18 DIAGNOSIS — I5022 Chronic systolic (congestive) heart failure: Secondary | ICD-10-CM

## 2015-06-18 DIAGNOSIS — N184 Chronic kidney disease, stage 4 (severe): Secondary | ICD-10-CM | POA: Diagnosis not present

## 2015-06-18 DIAGNOSIS — N183 Chronic kidney disease, stage 3 (moderate): Secondary | ICD-10-CM | POA: Insufficient documentation

## 2015-06-18 DIAGNOSIS — D631 Anemia in chronic kidney disease: Secondary | ICD-10-CM | POA: Insufficient documentation

## 2015-06-18 LAB — RENAL FUNCTION PANEL
Albumin: 2.3 g/dL — ABNORMAL LOW (ref 3.5–5.0)
Anion gap: 12 (ref 5–15)
BUN: 88 mg/dL — ABNORMAL HIGH (ref 6–20)
CO2: 21 mmol/L — ABNORMAL LOW (ref 22–32)
Calcium: 7.1 mg/dL — ABNORMAL LOW (ref 8.9–10.3)
Chloride: 102 mmol/L (ref 101–111)
Creatinine, Ser: 8.01 mg/dL — ABNORMAL HIGH (ref 0.61–1.24)
GFR calc Af Amer: 8 mL/min — ABNORMAL LOW (ref >60)
GFR calc non Af Amer: 7 mL/min — ABNORMAL LOW (ref >60)
Glucose, Bld: 195 mg/dL — ABNORMAL HIGH (ref 65–99)
Phosphorus: 5.5 mg/dL — ABNORMAL HIGH (ref 2.5–4.6)
Potassium: 3 mmol/L — ABNORMAL LOW (ref 3.5–5.1)
Sodium: 135 mmol/L (ref 135–145)

## 2015-06-18 LAB — IRON AND TIBC
Iron: 55 ug/dL (ref 45–182)
Saturation Ratios: 25 % (ref 17.9–39.5)
TIBC: 223 ug/dL — ABNORMAL LOW (ref 250–450)
UIBC: 168 ug/dL

## 2015-06-18 LAB — FERRITIN: Ferritin: 138 ng/mL (ref 24–336)

## 2015-06-18 LAB — POCT HEMOGLOBIN-HEMACUE: Hemoglobin: 9.7 g/dL — ABNORMAL LOW (ref 13.0–17.0)

## 2015-06-18 MED ORDER — PEG-KCL-NACL-NASULF-NA ASC-C 100 G PO SOLR: 1.0000 | Freq: Once | ORAL | Status: DC

## 2015-06-18 MED ORDER — DARBEPOETIN ALFA 100 MCG/0.5ML IJ SOSY
PREFILLED_SYRINGE | INTRAMUSCULAR | Status: AC
  Administered 2015-06-18: 100 ug via SUBCUTANEOUS
  Filled 2015-06-18: qty 0.5

## 2015-06-18 MED ORDER — DARBEPOETIN ALFA 100 MCG/0.5ML IJ SOSY
100.0000 ug | PREFILLED_SYRINGE | INTRAMUSCULAR | Status: DC
  Administered 2015-06-18: 100 ug via SUBCUTANEOUS

## 2015-06-18 NOTE — Progress Notes (Signed)
[facility] Consult Note:  History: Anthony Bullock 06/18/2015  Referring physician: [name], MD  Reason for consult/chief complaint: Colon Cancer Screening and Kidney Transplant Evaluation   Subjective HPI:  Screening colonoscopy evaluation Denies rectal bleeding, abdominal pain or family history of colon/rectal cancer. Had fistula last August but it did not mature properly.  Not on HD, about to go for initial renal transplant evaluation.  ROS:  Review of Systems Denies chest pain.  Has dyspnea with exertion  Past Medical History: Past Medical History  Diagnosis Date  . NICM (nonischemic cardiomyopathy) ([place])     Primarily nonischemic. Echo (12/14) with EF 25-30%. Echo (3/15) with EF 25%, mild to moderately dilated LV, normal RV size and systolic function.   . Diabetes mellitus type II   . Hyperlipidemia   . Hypertension   . Urethral stricture   . Kidney stones   . History of cardiac catheterization     a.Myoview 1/15:  There is significant left ventricular dysfunction. There may be slight scar at the apex. There is no significant ischemia. LV Ejection Fraction: 27%  //  b. RHC/LHC (1/15) with mean RA 6, PA 47/22 mean 33, mean PCWP 20, PVR 2.5 WU, CI 2.5; 80% dLAD stenosis, 70% diffuse large D.   . Chronic systolic heart failure ([place])     a. ECHO (12/2012) EF 25-30%, HK entireanteroseptal myocardium //  b.  EF 25%, diffuse HK, grade 1 diastolic dysfunction, MAC, mild LAE, normal RVSF, trivial pericardial effusion  . Allergic rhinitis   . CKD (chronic kidney disease)   . Diabetic neuropathy ([place])   . Diabetic nephropathy ([place])   . Anemia   . Arthritis   . COPD (chronic obstructive pulmonary disease) ([place])   . Pneumonia      Past Surgical History: Past Surgical History  Procedure Laterality Date  . Reapea urethral surgery for recurrent obstruction  2011  . Total knee arthroplasty Right 2007  . Left a nd right heart cath  01/30/2013    DR  BENSIHMON  . Cardiac defibrillator placement  06/27/2013    Sub Q       BY DR Caryl Comes  . Left and right heart catheterization with coronary angiogram N/A 01/30/2013    Procedure: LEFT AND RIGHT HEART CATHETERIZATION WITH CORONARY ANGIOGRAM;  Surgeon: Jolaine Artist, MD;  Location: [facility] CATH LAB;  Service: Cardiovascular;  Laterality: N/A;  . Implantable cardioverter defibrillator implant N/A 06/27/2013    Procedure: SUB Q ICD;  Surgeon: Deboraha Sprang, MD;  Location: [facility] CATH LAB;  Service: Cardiovascular;  Laterality: N/A;  . Bascilic vein transposition Right 08/22/2014    Procedure: RIGHT UPPER ARM [place];  Surgeon: Angelia Mould, MD;  Location: [facility];  Service: Vascular;  Laterality: Right;  . Peripheral vascular catheterization Right 01/26/2015    Procedure: A/V Fistulagram;  Surgeon: Angelia Mould, MD;  Location: [place] CV LAB;  Service: Cardiovascular;  Laterality: Right;     Family History: Family History  Problem Relation Age of Onset  . Bladder Cancer Mother   . Alcohol abuse Father   . Melanoma Father   . Stroke Maternal Grandmother   . Heart Problems Maternal Grandmother     unknown  . Heart disease Maternal Grandfather   . Prostate cancer Maternal Grandfather   . Diabetes Maternal Grandmother     Social History: Social History   Social History  . Marital Status: Married    Spouse Name: N/A  . Number  of Children: 0  . Years of Education: N/A   Social History Main Topics  . Smoking status: Former Smoker -- 2.00 packs/day for 32 years    Types: Cigarettes    Quit date: 05/11/2010  . Smokeless tobacco: Never Used  . Alcohol Use: No  . Drug Use: No  . Sexual Activity: Not Asked   Other Topics Concern  . 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 Morphine Sulfate Rash    REACTION: Itches all over, red spots    Outpatient Meds: Current Outpatient  Prescriptions  Medication Sig Dispense Refill  . aspirin EC 81 MG tablet Take 162 mg by mouth daily.    [name] atorvastatin (LIPITOR) 80 MG tablet Take 1 tablet (80 mg total) by mouth at bedtime. 90 tablet 3  . azelastine (ASTELIN) 0.1 % nasal spray Place 2 sprays into both nostrils 3 (three) times daily as needed for rhinitis. Use in each nostril as directed 30 mL 12  . BD PEN NEEDLE NANO U/F 32G X 4 MM MISC USE ONCE DAILY 90 each 2  . carvedilol (COREG) 25 MG tablet Take 1 tablet (25 mg total) by mouth 2 (two) times daily with a meal. 180 tablet 3  . Darbepoetin Alfa-Polysorbate (ARANESP, ALB FREE, SURECLICK IJ) Inject as directed. One injection into skin weekly for hemoglobin levels. Patient unsure of dosage.    . fenofibrate 160 MG tablet Take 1 tablet (160 mg total) by mouth daily. 90 tablet 3  . fexofenadine (ALLEGRA) 180 MG tablet Take 180 mg by mouth daily.    . furosemide (LASIX) 40 MG tablet Patient takes 120 MG in the am and 80MG  in the pm    . gabapentin (NEURONTIN) 300 MG capsule Take 1 capsule (300 mg total) by mouth 3 (three) times daily. 270 capsule 3  . glucose blood test strip Check 1 time daily. E11.9 One Touch Ultra Blue Test Strips 100 each 3  . HYDROcodone-acetaminophen (NORCO/VICODIN) 5-325 MG tablet Take 1 tablet by mouth every 6 (six) hours as needed for moderate pain. 30 tablet 0  . Insulin Glargine (LANTUS SOLOSTAR) 100 UNIT/ML Solostar Pen Inject 40 Units into the skin daily at 10 pm.     . insulin lispro (HUMALOG) 100 UNIT/ML injection Inject 0-14 Units into the skin 3 (three) times daily as needed for high blood sugar (per sliding scale).    . isosorbide-hydrALAZINE (BIDIL) 20-37.5 MG tablet Take 1 tablet by mouth 3 (three) times daily. 270 tablet 3  . Olopatadine HCl (PATADAY) 0.2 % SOLN Place 1 drop into both eyes daily as needed (for allergies).      No current facility-administered medications for this visit.   Facility-Administered Medications Ordered in Other  Visits  Medication Dose Route Frequency Provider Last Rate Last Dose  . Darbepoetin Alfa (ARANESP) injection 100 mcg  100 mcg Subcutaneous Q7 days Elmarie Shiley, MD   100 mcg at 06/18/15 1340      ___________________________________________________________________ Objective  Exam:  BP 118/60 mmHg  Pulse 84  Ht 5\' 10"  (1.778 m)  Wt 214 lb 6 oz (97.24 kg)  BMI 30.76 kg/m2   General: this is a(n) chronically-ill appearing man   Eyes: sclera anicteric, no redness  ENT: oral mucosa moist without lesions, no cervical or supraclavicular lymphadenopathy, good dentition  CV: RRR without murmur, S1/S2, no JVD, mild peripheral edema  Resp: clear to auscultation bilaterally, normal  RR and effort noted  GI: soft, no tenderness, with active bowel sounds. No guarding or palpable organomegaly noted.  Skin; warm and dry, no rash or jaundice noted  Neuro: awake, alert and oriented x 3. Normal gross motor function and fluent speech  Weak thrill in LUE AV fistula  Labs:  CMP Latest Ref Rng 06/18/2015 06/04/2015 05/21/2015  Glucose 65 - 99 mg/dL 195(H) - 335(H)  BUN 6 - 20 mg/dL 88(H) - 91(H)  Creatinine 0.61 - 1.24 mg/dL 8.01(H) - 8.30(H)  Sodium 135 - 145 mmol/L 135 - 137  Potassium 3.5 - 5.1 mmol/L 3.0(L) - 3.9  Chloride 101 - 111 mmol/L 102 - 101  CO2 22 - 32 mmol/L 21(L) - 22  Calcium 8.9 - 10.3 mg/dL 7.1(L) 13.6(HH) 9.5      Radiologic Studies:  Last EF 25% in march 2015  Assessment: Encounter Diagnoses  Name Primary?  . Special screening for malignant neoplasms, colon Yes  . Chronic systolic congestive heart failure ([place])   . Chronic kidney disease (CKD), stage IV (severe) ([place])     Scope must be done at hospital endo lab due to CHF  Plan:  Colonoscopy. He was advised to contact his renal doctor about the low potassium level found today.  Thank you for the courtesy of this consult.  Please call me with any questions or concerns.  [name]  CC:  [name], MD

## 2015-06-18 NOTE — Patient Instructions (Signed)
You have been scheduled for a colonoscopy. Please follow written instructions given to you at your visit today.  Please pick up your prep supplies at the pharmacy within the next 1-3 days. If you use inhalers (even only as needed), please bring them with you on the day of your procedure. Your physician has requested that you go to www.startemmi.com and enter the access code given to you at your visit today. This web site gives a general overview about your procedure. However, you should still follow specific instructions given to you by our office regarding your preparation for the procedure.  If you are age 61 or older, your body mass index should be between 23-30. Your Body mass index is 30.76 kg/(m^2). If this is out of the aforementioned range listed, please consider follow up with your Primary Care Provider.  If you are age 37 or younger, your body mass index should be between 19-25. Your Body mass index is 30.76 kg/(m^2). If this is out of the aformentioned range listed, please consider follow up with your Primary Care Provider.   Thank you for choosing [facility]  Dr Wilfrid Lund III

## 2015-06-24 ENCOUNTER — Other Ambulatory Visit (HOSPITAL_COMMUNITY): Payer: Self-pay | Admitting: *Deleted

## 2015-06-25 ENCOUNTER — Encounter (HOSPITAL_COMMUNITY)
Admission: RE | Admit: 2015-06-25 | Discharge: 2015-06-25 | Disposition: A | Discharge status: Home / Self Care | Payer: BLUE CROSS/BLUE SHIELD | Source: Ambulatory Visit | Attending: Nephrology | Admitting: Nephrology

## 2015-06-25 DIAGNOSIS — N183 Chronic kidney disease, stage 3 (moderate): Secondary | ICD-10-CM | POA: Diagnosis not present

## 2015-06-25 LAB — POCT HEMOGLOBIN-HEMACUE: Hemoglobin: 9 g/dL — ABNORMAL LOW (ref 13.0–17.0)

## 2015-06-25 MED ORDER — DARBEPOETIN ALFA 100 MCG/0.5ML IJ SOSY
PREFILLED_SYRINGE | INTRAMUSCULAR | Status: AC
  Filled 2015-06-25: qty 0.5

## 2015-06-25 MED ORDER — DARBEPOETIN ALFA 100 MCG/0.5ML IJ SOSY
100.0000 ug | PREFILLED_SYRINGE | INTRAMUSCULAR | Status: DC
  Administered 2015-06-25: 100 ug via SUBCUTANEOUS

## 2015-07-02 ENCOUNTER — Inpatient Hospital Stay (HOSPITAL_COMMUNITY): Admission: RE | Admit: 2015-07-02 | Payer: BLUE CROSS/BLUE SHIELD | Source: Ambulatory Visit

## 2015-07-13 ENCOUNTER — Telehealth: Payer: Self-pay | Admitting: Gastroenterology

## 2015-07-13 NOTE — Telephone Encounter (Signed)
Started dialysis 07/03/15 and colon procedure on 07/22/15, pt advised it should not interfere with the procedure and the colon is not scheduled on his dialysis days.  FYI to Dr Loletha Carrow.

## 2015-07-13 NOTE — Telephone Encounter (Signed)
Yes, it will be OK to proceed,  But as noted in my office note, it will need to be done in the hospital endo lab due to his CHF

## 2015-07-15 ENCOUNTER — Telehealth: Payer: Self-pay

## 2015-07-15 ENCOUNTER — Encounter (HOSPITAL_COMMUNITY): Payer: Self-pay | Admitting: *Deleted

## 2015-07-15 DIAGNOSIS — N186 End stage renal disease: Secondary | ICD-10-CM

## 2015-07-15 DIAGNOSIS — T82510D Breakdown (mechanical) of surgically created arteriovenous fistula, subsequent encounter: Secondary | ICD-10-CM

## 2015-07-15 NOTE — Telephone Encounter (Signed)
Sched appt 8/9, lab at 9:00 and md at 9:45. Spoke to pt to inform him of appt.

## 2015-07-15 NOTE — Telephone Encounter (Signed)
Phone call to pt. To discuss scheduling for a Fistulogram, as was initiated by Ramiro Harvest, PA with Dr. Florentina Addison office.  Pt. Last seen by Dr. Scot Dock 04/2015, and advised he would need a Fistulogram of Right BVT  with venoplasty of 2 areas of stenosis.  Today, pt. reported his thrill is no longer present in the right arm.  Requested an appt. To be re-evaluated by Dr. Scot Dock.  Discussed with Dr. Scot Dock.  Recommended to schedule pt. For right arm access duplex with office appt.  Will contact pt. To schedule.

## 2015-07-20 ENCOUNTER — Encounter (HOSPITAL_COMMUNITY): Payer: BLUE CROSS/BLUE SHIELD

## 2015-07-22 ENCOUNTER — Ambulatory Visit: Payer: BLUE CROSS/BLUE SHIELD | Admitting: Vascular Surgery

## 2015-07-22 ENCOUNTER — Ambulatory Visit (HOSPITAL_COMMUNITY)
Admission: RE | Admit: 2015-07-22 | Discharge: 2015-07-22 | Disposition: A | Discharge status: Home / Self Care | Payer: BLUE CROSS/BLUE SHIELD | Source: Ambulatory Visit | Attending: Gastroenterology | Admitting: Gastroenterology

## 2015-07-22 ENCOUNTER — Encounter (HOSPITAL_COMMUNITY)
Admission: RE | Disposition: A | Discharge status: Home / Self Care | Payer: Self-pay | Source: Ambulatory Visit | Attending: Gastroenterology

## 2015-07-22 ENCOUNTER — Ambulatory Visit (HOSPITAL_COMMUNITY): Payer: BLUE CROSS/BLUE SHIELD | Admitting: Anesthesiology

## 2015-07-22 ENCOUNTER — Encounter (HOSPITAL_COMMUNITY): Payer: Self-pay | Admitting: *Deleted

## 2015-07-22 ENCOUNTER — Other Ambulatory Visit: Payer: Self-pay | Admitting: *Deleted

## 2015-07-22 DIAGNOSIS — M1990 Unspecified osteoarthritis, unspecified site: Secondary | ICD-10-CM | POA: Insufficient documentation

## 2015-07-22 DIAGNOSIS — I130 Hypertensive heart and chronic kidney disease with heart failure and stage 1 through stage 4 chronic kidney disease, or unspecified chronic kidney disease: Secondary | ICD-10-CM | POA: Diagnosis not present

## 2015-07-22 DIAGNOSIS — Z794 Long term (current) use of insulin: Secondary | ICD-10-CM | POA: Insufficient documentation

## 2015-07-22 DIAGNOSIS — Z96651 Presence of right artificial knee joint: Secondary | ICD-10-CM | POA: Diagnosis not present

## 2015-07-22 DIAGNOSIS — Z992 Dependence on renal dialysis: Secondary | ICD-10-CM | POA: Diagnosis not present

## 2015-07-22 DIAGNOSIS — I428 Other cardiomyopathies: Secondary | ICD-10-CM | POA: Insufficient documentation

## 2015-07-22 DIAGNOSIS — Z4931 Encounter for adequacy testing for hemodialysis: Secondary | ICD-10-CM

## 2015-07-22 DIAGNOSIS — I2510 Atherosclerotic heart disease of native coronary artery without angina pectoris: Secondary | ICD-10-CM | POA: Insufficient documentation

## 2015-07-22 DIAGNOSIS — E785 Hyperlipidemia, unspecified: Secondary | ICD-10-CM | POA: Diagnosis not present

## 2015-07-22 DIAGNOSIS — N184 Chronic kidney disease, stage 4 (severe): Secondary | ICD-10-CM

## 2015-07-22 DIAGNOSIS — J449 Chronic obstructive pulmonary disease, unspecified: Secondary | ICD-10-CM | POA: Diagnosis not present

## 2015-07-22 DIAGNOSIS — E1140 Type 2 diabetes mellitus with diabetic neuropathy, unspecified: Secondary | ICD-10-CM | POA: Diagnosis not present

## 2015-07-22 DIAGNOSIS — Z79899 Other long term (current) drug therapy: Secondary | ICD-10-CM | POA: Insufficient documentation

## 2015-07-22 DIAGNOSIS — I5022 Chronic systolic (congestive) heart failure: Secondary | ICD-10-CM | POA: Diagnosis not present

## 2015-07-22 DIAGNOSIS — I252 Old myocardial infarction: Secondary | ICD-10-CM | POA: Diagnosis not present

## 2015-07-22 DIAGNOSIS — D128 Benign neoplasm of rectum: Secondary | ICD-10-CM | POA: Insufficient documentation

## 2015-07-22 DIAGNOSIS — E1165 Type 2 diabetes mellitus with hyperglycemia: Secondary | ICD-10-CM | POA: Diagnosis not present

## 2015-07-22 DIAGNOSIS — E1122 Type 2 diabetes mellitus with diabetic chronic kidney disease: Secondary | ICD-10-CM | POA: Insufficient documentation

## 2015-07-22 DIAGNOSIS — Z7982 Long term (current) use of aspirin: Secondary | ICD-10-CM | POA: Diagnosis not present

## 2015-07-22 DIAGNOSIS — Z87891 Personal history of nicotine dependence: Secondary | ICD-10-CM | POA: Diagnosis not present

## 2015-07-22 DIAGNOSIS — Z1211 Encounter for screening for malignant neoplasm of colon: Secondary | ICD-10-CM

## 2015-07-22 DIAGNOSIS — J309 Allergic rhinitis, unspecified: Secondary | ICD-10-CM | POA: Diagnosis not present

## 2015-07-22 DIAGNOSIS — Z95810 Presence of automatic (implantable) cardiac defibrillator: Secondary | ICD-10-CM | POA: Diagnosis not present

## 2015-07-22 DIAGNOSIS — N186 End stage renal disease: Secondary | ICD-10-CM | POA: Diagnosis not present

## 2015-07-22 DIAGNOSIS — K640 First degree hemorrhoids: Secondary | ICD-10-CM | POA: Diagnosis not present

## 2015-07-22 HISTORY — PX: COLONOSCOPY WITH PROPOFOL: SHX5780

## 2015-07-22 LAB — BASIC METABOLIC PANEL
Anion gap: 8 (ref 5–15)
BUN: 14 mg/dL (ref 6–20)
CO2: 27 mmol/L (ref 22–32)
Calcium: 7.8 mg/dL — ABNORMAL LOW (ref 8.9–10.3)
Chloride: 103 mmol/L (ref 101–111)
Creatinine, Ser: 4.11 mg/dL — ABNORMAL HIGH (ref 0.61–1.24)
GFR calc Af Amer: 18 mL/min — ABNORMAL LOW (ref >60)
GFR calc non Af Amer: 15 mL/min — ABNORMAL LOW (ref >60)
Glucose, Bld: 91 mg/dL (ref 65–99)
Potassium: 3.6 mmol/L (ref 3.5–5.1)
Sodium: 138 mmol/L (ref 135–145)

## 2015-07-22 SURGERY — COLONOSCOPY WITH PROPOFOL
Anesthesia: Monitor Anesthesia Care

## 2015-07-22 MED ORDER — SODIUM CHLORIDE 0.9 % IV SOLN
INTRAVENOUS | Status: DC
  Administered 2015-07-22: 500 mL via INTRAVENOUS

## 2015-07-22 MED ORDER — PROPOFOL 10 MG/ML IV BOLUS
INTRAVENOUS | Status: AC
  Filled 2015-07-22: qty 40

## 2015-07-22 MED ORDER — LIDOCAINE HCL (CARDIAC) 20 MG/ML IV SOLN
INTRAVENOUS | Status: DC | PRN
  Administered 2015-07-22: 30 mg via INTRATRACHEAL

## 2015-07-22 MED ORDER — PROPOFOL 500 MG/50ML IV EMUL
INTRAVENOUS | Status: DC | PRN
  Administered 2015-07-22: 40 mg via INTRAVENOUS
  Administered 2015-07-22: 20 mg via INTRAVENOUS

## 2015-07-22 MED ORDER — LIDOCAINE HCL (CARDIAC) 20 MG/ML IV SOLN
INTRAVENOUS | Status: AC
  Filled 2015-07-22: qty 5

## 2015-07-22 MED ORDER — PROPOFOL 500 MG/50ML IV EMUL
INTRAVENOUS | Status: DC | PRN
Start: 2015-07-22 — End: 2015-07-22
  Administered 2015-07-22: 100 ug/kg/min via INTRAVENOUS

## 2015-07-22 SURGICAL SUPPLY — 22 items

## 2015-07-22 NOTE — Interval H&P Note (Signed)
History and Physical Interval Note:  07/22/2015 1:18 PM  Anthony Bullock  has presented today for surgery, with the diagnosis of screening  The various methods of treatment have been discussed with the patient and family. After consideration of risks, benefits and other options for treatment, the patient has consented to  Procedure(s): COLONOSCOPY WITH PROPOFOL (N/A) as a surgical intervention .  The patient's history has been reviewed, patient examined, no change in status, stable for surgery.  I have reviewed the patient's chart and labs.  Questions were answered to the patient's satisfaction.     [name]

## 2015-07-22 NOTE — Anesthesia Preprocedure Evaluation (Addendum)
Anesthesia Evaluation  Patient identified by MRN, date of birth, ID band Patient awake    Reviewed: Allergy & Precautions, NPO status , Patient's Chart, lab work & pertinent test results  Airway Mallampati: II  TM Distance: >3 FB Neck ROM: Full    Dental no notable dental hx. (+) Teeth Intact   Pulmonary COPD, former smoker,    Pulmonary exam normal breath sounds clear to auscultation       Cardiovascular Exercise Tolerance: Poor hypertension, Pt. on medications + CAD, + Past MI and +CHF  Normal cardiovascular exam Rhythm:Regular Rate:Normal + Diastolic murmurs Echo ([id number]) with EF 25%, mild to moderately dilated LV, normal RV size and systolic function   Neuro/Psych negative neurological ROS  negative psych ROS   GI/Hepatic negative GI ROS, Neg liver ROS,   Endo/Other  diabetes, Poorly Controlled, Type 2, Insulin Dependent  Renal/GU ESRF and DialysisRenal disease  negative genitourinary   Musculoskeletal negative musculoskeletal ROS (+) Arthritis ,   Abdominal   Peds negative pediatric ROS (+)  Hematology negative hematology ROS (+)   Anesthesia Other Findings   Reproductive/Obstetrics negative OB ROS                           EKG: normal sinus rhythm.  Echo (2015): - Left ventricle: The cavity size was mildly to moderately dilated. Wall thickness was normal. The estimated ejection fraction was 25%. Diffuse hypokinesis. Doppler parameters are consistent with abnormal left ventricular relaxation (grade 1 diastolic dysfunction). - Aortic valve: There was no stenosis. - Mitral valve: Mildly calcified annulus. Trivial regurgitation. - Left atrium: The atrium was mildly dilated. - Right ventricle: The cavity size was normal. Systolic function was normal. - Pulmonary arteries: No complete TR doppler jet so unable to estimate PA systolic pressure. - Inferior vena cava: The  vessel was normal in size; the respirophasic diameter changes were in the normal range (= 50%); findings are consistent with normal central venous pressure. - Pericardium, extracardiac: A trivial pericardial effusion was identified.  Anesthesia Physical  Anesthesia Plan  ASA: III  Anesthesia Plan: MAC   Post-op Pain Management:    Induction: Intravenous  Airway Management Planned: Natural Airway  Additional Equipment:   Intra-op Plan:   Post-operative Plan:   Informed Consent: I have reviewed the patients History and Physical, chart, labs and discussed the procedure including the risks, benefits and alternatives for the proposed anesthesia with the patient or authorized representative who has indicated his/her understanding and acceptance.   Dental advisory given  Plan Discussed with: CRNA  Anesthesia Plan Comments: (Will continue ICD at this time. Will discuss electrocautery with surgeon. Magnet will be placed on patient in the OR )        Anesthesia Quick Evaluation

## 2015-07-22 NOTE — Discharge Instructions (Signed)

## 2015-07-22 NOTE — Op Note (Signed)
[facility] Patient Name: Anthony Bullock Procedure Date: 07/22/2015 MRN: [id number] Attending MD: Estill Cotta. Loletha Carrow , MD Date of Birth: June 13, 1960 CSN: [id number] Age: 55 Admit Type: Outpatient Procedure:                Colonoscopy Indications:              Screening for colorectal malignant neoplasm, This                            is the patient's first colonoscopy Providers:                Mallie Mussel L. Loletha Carrow, MD, Cleda Daub, RN, William Dalton, Technician Referring MD:              Medicines:                Monitored Anesthesia Care Complications:            No immediate complications. Estimated Blood Loss:     Estimated blood loss was minimal. Procedure:                Pre-Anesthesia Assessment:                           - Prior to the procedure, a History and Physical                            was performed, and patient medications and                            allergies were reviewed. The patient's tolerance of                            previous anesthesia was also reviewed. The risks                            and benefits of the procedure and the sedation                            options and risks were discussed with the patient.                            All questions were answered, and informed consent                            was obtained. Prior Anticoagulants: The patient has                            taken aspirin, last dose was 4 days prior to                            procedure. ASA Grade Assessment: III - A patient  with severe systemic disease. After reviewing the                            risks and benefits, the patient was deemed in                            satisfactory condition to undergo the procedure.                           After obtaining informed consent, the colonoscope                            was passed under direct vision. Throughout the                            procedure,  the patient's blood pressure, pulse, and                            oxygen saturations were monitored continuously. The                            [id number]) scope was introduced through                            the anus and advanced to the the cecum, identified                            by appendiceal orifice and ileocecal valve. The                            colonoscopy was performed without difficulty. The                            patient tolerated the procedure well. The quality                            of the bowel preparation was excellent. The                            ileocecal valve, appendiceal orifice, and rectum                            were photographed. The bowel preparation used was                            SUPREP. Findings:      The perianal and digital rectal examinations were normal.      A 6 mm polyp was found in the rectum. The polyp was sessile. The polyp       was removed with a cold snare. Resection and retrieval were complete.       For hemostasis, one hemostatic clip was successfully placed. There was       no bleeding at the end of the procedure.      A 4 mm polyp was found  in the rectum. The polyp was sessile. The polyp       was removed with a hot snare. Resection and retrieval were complete.      Internal hemorrhoids were found during retroflexion. The hemorrhoids       were Grade I (internal hemorrhoids that do not prolapse).      The exam was otherwise without abnormality. Impression:               - One 6 mm polyp in the rectum, removed with a cold                            snare. Resected and retrieved. Clip was placed.                           - One 4 mm polyp in the rectum, removed with a hot                            snare. Resected and retrieved.                           - Internal hemorrhoids.                           - The examination was otherwise normal. Moderate Sedation:      MAC sedation used Recommendation:            - Patient has a contact number available for                            emergencies. The signs and symptoms of potential                            delayed complications were discussed with the                            patient. Return to normal activities tomorrow.                            Written discharge instructions were provided to the                            patient.                           - Resume previous diet.                           - No aspirin, ibuprofen, naproxen, or other                            non-steroidal anti-inflammatory drugs for 5 days                            after polyp removal.                           -  Await pathology results.                           - Repeat colonoscopy is recommended for                            surveillance. The colonoscopy date will be                            determined after pathology results from today's                            exam become available for review. Procedure Code(s):        --- Professional ---                           [phone], Colonoscopy, flexible; with removal of                            tumor(s), polyp(s), or other lesion(s) by snare                            technique Diagnosis Code(s):        --- Professional ---                           Z12.11, Encounter for screening for malignant                            neoplasm of colon                           K62.1, Rectal polyp                           K64.0, First degree hemorrhoids CPT copyright 2016 [employer]. All rights reserved. The codes documented in this report are preliminary and upon coder review may  be revised to meet current compliance requirements. Henry L. Loletha Carrow, MD 07/22/2015 1:54:59 PM This report has been signed electronically. Number of Addenda: 0

## 2015-07-22 NOTE — Transfer of Care (Signed)
Immediate Anesthesia Transfer of Care Note  Patient: Anthony Bullock  Procedure(s) Performed: Procedure(s): COLONOSCOPY WITH PROPOFOL (N/A)  Patient Location: PACU  Anesthesia Type:MAC  Level of Consciousness: awake, alert  and oriented  Airway & Oxygen Therapy: Patient Spontanous Breathing and Patient connected to face mask oxygen  Post-op Assessment: Report given to RN and Post -op Vital signs reviewed and stable  Post vital signs: Reviewed and stable  Last Vitals:  Filed Vitals:   07/22/15 1244  BP: 150/62  Pulse: 88  Temp: 37.3 C  Resp: 14    Last Pain: There were no vitals filed for this visit.       Complications: No apparent anesthesia complications

## 2015-07-22 NOTE — Anesthesia Postprocedure Evaluation (Signed)
Anesthesia Post Note  Patient: Anthony Bullock  Procedure(s) Performed: Procedure(s) (LRB): COLONOSCOPY WITH PROPOFOL (N/A)  Patient location during evaluation: Endoscopy Anesthesia Type: MAC Level of consciousness: awake and alert Pain management: pain level controlled Vital Signs Assessment: post-procedure vital signs reviewed and stable Respiratory status: spontaneous breathing, nonlabored ventilation, respiratory function stable and patient connected to nasal cannula oxygen Cardiovascular status: stable and blood pressure returned to baseline Anesthetic complications: no    Last Vitals:  Filed Vitals:   07/22/15 1410 07/22/15 1420  BP: 178/76   Pulse: 85 87  Temp:    Resp: 22 13    Last Pain: There were no vitals filed for this visit.               Montez Hageman

## 2015-07-22 NOTE — H&P (Signed)
[facility] Consult Note:  History: Anthony Bullock 06/18/2015  Referring physician: [name], MD   Subjective HPI:  Referred for screening colonoscopy prior to consideration of renal transplant. He denies abdominal pain , change in bowel habits or rectal bleeding. He started dialysis since his office visit with me in June  ROS:  Review of Systems  Denies chest pain or dyspnea.  Past Medical History: Past Medical History  Diagnosis Date  . NICM (nonischemic cardiomyopathy) ([place])     Primarily nonischemic. Echo (12/14) with EF 25-30%. Echo (3/15) with EF 25%, mild to moderately dilated LV, normal RV size and systolic function.   . Diabetes mellitus type II   . Hyperlipidemia   . Hypertension   . Urethral stricture   . Kidney stones   . History of cardiac catheterization     a.Myoview 1/15:  There is significant left ventricular dysfunction. There may be slight scar at the apex. There is no significant ischemia. LV Ejection Fraction: 27%  //  b. RHC/LHC (1/15) with mean RA 6, PA 47/22 mean 33, mean PCWP 20, PVR 2.5 WU, CI 2.5; 80% dLAD stenosis, 70% diffuse large D.   . Chronic systolic heart failure ([place])     a. ECHO (12/2012) EF 25-30%, HK entireanteroseptal myocardium //  b.  EF 25%, diffuse HK, grade 1 diastolic dysfunction, MAC, mild LAE, normal RVSF, trivial pericardial effusion  . Allergic rhinitis   . CKD (chronic kidney disease)   . Diabetic neuropathy ([place])   . Diabetic nephropathy ([place])   . Anemia   . Arthritis   . COPD (chronic obstructive pulmonary disease) ([place])   . Pneumonia      Past Surgical History: Past Surgical History  Procedure Laterality Date  . Reapea urethral surgery for recurrent obstruction  2011  . Total knee arthroplasty Right 2007  . Left a nd right heart cath  01/30/2013    DR BENSIHMON  . Cardiac defibrillator placement  06/27/2013    Sub Q       BY DR Caryl Comes  . Left and right heart catheterization with  coronary angiogram N/A 01/30/2013    Procedure: LEFT AND RIGHT HEART CATHETERIZATION WITH CORONARY ANGIOGRAM;  Surgeon: Jolaine Artist, MD;  Location: [facility] CATH LAB;  Service: Cardiovascular;  Laterality: N/A;  . Implantable cardioverter defibrillator implant N/A 06/27/2013    Procedure: SUB Q ICD;  Surgeon: Deboraha Sprang, MD;  Location: [facility] CATH LAB;  Service: Cardiovascular;  Laterality: N/A;  . Bascilic vein transposition Right 08/22/2014    Procedure: RIGHT UPPER ARM [place];  Surgeon: Angelia Mould, MD;  Location: [place] [facility];  Service: Vascular;  Laterality: Right;  . Peripheral vascular catheterization Right 01/26/2015    Procedure: A/V Fistulagram;  Surgeon: Angelia Mould, MD;  Location: [place] CV LAB;  Service: Cardiovascular;  Laterality: Right;  . Cardiac catheterization       Family History: Family History  Problem Relation Age of Onset  . Bladder Cancer Mother   . Alcohol abuse Father   . Melanoma Father   . Stroke Maternal Grandmother   . Heart Problems Maternal Grandmother     unknown  . Heart disease Maternal Grandfather   . Prostate cancer Maternal Grandfather   . Diabetes Maternal Grandmother     Social History: Social History   Social History  . Marital Status: Married    Spouse Name: N/A  . Number of Children: 0  . Years of Education: N/A  Social History Main Topics  . Smoking status: Former Smoker -- 2.00 packs/day for 32 years    Types: Cigarettes    Quit date: 05/11/2010  . Smokeless tobacco: Never Used  . Alcohol Use: No  . Drug Use: No  . Sexual Activity: Not Asked   Other Topics Concern  . 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 Morphine Sulfate Rash    REACTION: Itches all over, red spots    Outpatient Meds: No current facility-administered medications for this encounter.   Current Outpatient Prescriptions  Medication Sig  Dispense Refill  . aspirin EC 81 MG tablet Take 162 mg by mouth daily.    [name] atorvastatin (LIPITOR) 80 MG tablet Take 1 tablet (80 mg total) by mouth at bedtime. 90 tablet 3  . azelastine (ASTELIN) 0.1 % nasal spray Place 2 sprays into both nostrils 3 (three) times daily as needed for rhinitis. Use in each nostril as directed 30 mL 12  . BD PEN NEEDLE NANO U/F 32G X 4 MM MISC USE ONCE DAILY 90 each 2  . carvedilol (COREG) 25 MG tablet Take 1 tablet (25 mg total) by mouth 2 (two) times daily with a meal. 180 tablet 3  . fenofibrate 160 MG tablet Take 1 tablet (160 mg total) by mouth daily. 90 tablet 3  . fexofenadine (ALLEGRA) 180 MG tablet Take 180 mg by mouth daily.    [name] gabapentin (NEURONTIN) 300 MG capsule Take 1 capsule (300 mg total) by mouth 3 (three) times daily. 270 capsule 3  . glucose blood test strip Check 1 time daily. E11.9 One Touch Ultra Blue Test Strips 100 each 3  . HYDROcodone-acetaminophen (NORCO/VICODIN) 5-325 MG tablet Take 1 tablet by mouth every 6 (six) hours as needed for moderate pain. 30 tablet 0  . Insulin Glargine (LANTUS SOLOSTAR) 100 UNIT/ML Solostar Pen Inject 40 Units into the skin daily at 10 pm.     . insulin lispro (HUMALOG) 100 UNIT/ML injection Inject 0-14 Units into the skin 3 (three) times daily as needed for high blood sugar (per sliding scale).    . isosorbide-hydrALAZINE (BIDIL) 20-37.5 MG tablet Take 1 tablet by mouth 3 (three) times daily. 270 tablet 3  . Olopatadine HCl (PATADAY) 0.2 % SOLN Place 1 drop into both eyes daily as needed (for allergies).     . Darbepoetin Alfa-Polysorbate (ARANESP, ALB FREE, SURECLICK IJ) Inject as directed. One injection into skin weekly for hemoglobin levels. Patient unsure of dosage.        ___________________________________________________________________ Objective  Exam:  There were no vitals taken for this visit.   General: this is a(n) well-appearing man (wife at bedside)   Eyes: sclera anicteric, no  redness  ENT: oral mucosa moist without lesions, no cervical or supraclavicular lymphadenopathy, good dentition  CV: RRR without murmur, S1/S2, no JVD, no peripheral edema  Resp: clear to auscultation bilaterally, normal RR and effort noted.  HD catheter right subclavian artery  GI: soft, no tenderness, with active bowel sounds. No guarding or palpable organomegaly noted.  Skin; warm and dry, no rash or jaundice noted  Neuro: awake, alert and oriented x 3. Normal gross motor function and fluent speech   Assessment: @DX @  Average risk for colorectal cancer  Plan:  Screening colonoscopy  Thank you for the courtesy of this consult.  Please call me with any questions or concerns.  [name]  CC: [name], MD

## 2015-07-24 ENCOUNTER — Encounter (HOSPITAL_COMMUNITY): Payer: Self-pay | Admitting: Gastroenterology

## 2015-07-24 ENCOUNTER — Encounter: Payer: Self-pay | Admitting: Gastroenterology

## 2015-07-29 ENCOUNTER — Ambulatory Visit: Payer: BLUE CROSS/BLUE SHIELD | Admitting: Vascular Surgery

## 2015-08-04 ENCOUNTER — Ambulatory Visit (INDEPENDENT_AMBULATORY_CARE_PROVIDER_SITE_OTHER): Payer: BLUE CROSS/BLUE SHIELD | Admitting: Family Medicine

## 2015-08-04 ENCOUNTER — Encounter: Payer: Self-pay | Admitting: Family Medicine

## 2015-08-04 VITALS — BP 160/80 | HR 91 | Temp 99.4°F | Ht 70.0 in | Wt 205.0 lb

## 2015-08-04 DIAGNOSIS — E1129 Type 2 diabetes mellitus with other diabetic kidney complication: Secondary | ICD-10-CM | POA: Diagnosis not present

## 2015-08-04 DIAGNOSIS — I5022 Chronic systolic (congestive) heart failure: Secondary | ICD-10-CM | POA: Diagnosis not present

## 2015-08-04 DIAGNOSIS — I2510 Atherosclerotic heart disease of native coronary artery without angina pectoris: Secondary | ICD-10-CM | POA: Diagnosis not present

## 2015-08-04 DIAGNOSIS — I429 Cardiomyopathy, unspecified: Secondary | ICD-10-CM

## 2015-08-04 DIAGNOSIS — N184 Chronic kidney disease, stage 4 (severe): Secondary | ICD-10-CM

## 2015-08-04 DIAGNOSIS — E1165 Type 2 diabetes mellitus with hyperglycemia: Secondary | ICD-10-CM

## 2015-08-04 DIAGNOSIS — I428 Other cardiomyopathies: Secondary | ICD-10-CM

## 2015-08-04 NOTE — Progress Notes (Signed)
Subjective:     Patient ID: Anthony Bullock, male   DOB: 30-Nov-1960, 55 y.o.   MRN: [id number]  HPI Patient has multiple chronic medical problems. End stage renal disease currently on dialysis which he started June 23. He is followed in [place], [place].   He has been evaluated by [facility] renal transplant program. He had recent colonoscopy which showed a couple of benign polyps. They are requesting repeat echocardiogram. Last echo apparently March 2015 with ejection fraction 25%. He has some chronic dyspnea which is unchanged. No orthopnea. No significant peripheral edema. Denies recent chest pains.  Type 2 diabetes. History of very poor compliance. Recent A1c 8.1%. Denies recent hypoglycemia.  Past Medical History:  Diagnosis Date  . Allergic rhinitis   . Anemia   . Arthritis   . Chronic systolic heart failure ([place])    a. ECHO (12/2012) EF 25-30%, HK entireanteroseptal myocardium //  b.  EF 25%, diffuse HK, grade 1 diastolic dysfunction, MAC, mild LAE, normal RVSF, trivial pericardial effusion  . CKD (chronic kidney disease)   . COPD (chronic obstructive pulmonary disease) ([place])   . Diabetes mellitus type II   . Diabetic nephropathy ([place])   . Diabetic neuropathy ([place])   . History of cardiac catheterization    a.Myoview 1/15:  There is significant left ventricular dysfunction. There may be slight scar at the apex. There is no significant ischemia. LV Ejection Fraction: 27%  //  b. RHC/LHC (1/15) with mean RA 6, PA 47/22 mean 33, mean PCWP 20, PVR 2.5 WU, CI 2.5; 80% dLAD stenosis, 70% diffuse large D.   . Hyperlipidemia   . Hypertension   . Kidney stones   . NICM (nonischemic cardiomyopathy) ([place])    Primarily nonischemic. Echo (12/14) with EF 25-30%. Echo (3/15) with EF 25%, mild to moderately dilated LV, normal RV size and systolic function.   . Pneumonia   . Urethral stricture    Past Surgical History:  Procedure Laterality Date  . BASCILIC VEIN TRANSPOSITION  Right 08/22/2014   Procedure: RIGHT UPPER ARM BASCILIC VEIN TRANSPOSITION;  Surgeon: Angelia Mould, MD;  Location: [facility];  Service: Vascular;  Laterality: Right;  . CARDIAC CATHETERIZATION    . CARDIAC DEFIBRILLATOR PLACEMENT  06/27/2013   Sub Q       BY DR Caryl Comes  . COLONOSCOPY WITH PROPOFOL N/A 07/22/2015   Procedure: COLONOSCOPY WITH PROPOFOL;  Surgeon: Doran Stabler, MD;  Location: [facility];  Service: Gastroenterology;  Laterality: N/A;  . IMPLANTABLE CARDIOVERTER DEFIBRILLATOR IMPLANT N/A 06/27/2013   Procedure: SUB Q ICD;  Surgeon: Deboraha Sprang, MD;  Location[facility] C[facility] LAB;  Service: Cardiovascular;  Laterality: N/A;  . LEFT A ND RIGHT HEART CATH  01/30/2013   DR BENSIHMON  . LEFT AND RIGHT HEART CATHETERIZATION WITH CORONARY ANGIOGRAM N/A 01/30/2013   Procedure: LEFT AND RIGHT HEART CATHETERIZATION WITH CORONARY ANGIOGRAM;  Surgeon: Jolaine Artist, MD;  Location: [facility] CATH LAB;  Service: Cardiovascular;  Laterality: N/A;  . PERIPHERAL VASCULAR CATHETERIZATION Right 01/26/2015   Procedure: A/V Fistulagram;  Surgeon: Angelia Mould, MD;  Location: [place] CV LAB;  Service: Cardiovascular;  Laterality: Right;  . reapea urethral surgery for recurrent obstruction  2011  . TOTAL KNEE ARTHROPLASTY Right 2007    reports that he quit smoking about 5 years ago. His smoking use included Cigarettes. He has a 64.00 pack-year smoking history. He has never used smokeless tobacco. He reports that he does not drink alcohol  or use drugs. family history includes Alcohol abuse in his father; Bladder Cancer in his mother; Diabetes in his maternal grandmother; Heart Problems in his maternal grandmother; Heart disease in his maternal grandfather; Melanoma in his father; Prostate cancer in his maternal grandfather; Stroke in his maternal grandmother. Allergies  Allergen Reactions  . Morphine Sulfate Rash    REACTION: Itches all over, red spots     Review of Systems  Constitutional:  Negative for chills and fever.  Respiratory: Positive for shortness of breath (Chronic and unchanged). Negative for cough and chest tightness.   Cardiovascular: Negative for chest pain, palpitations and leg swelling.       Objective:   Physical Exam  Constitutional: He appears well-developed and well-nourished.  Neck: Neck supple.  Cardiovascular: Normal rate and regular rhythm.   Pulmonary/Chest: Effort normal and breath sounds normal. No respiratory distress. He has no wheezes. He has no rales.  Musculoskeletal: He exhibits no edema.  Lymphadenopathy:    He has no cervical adenopathy.       Assessment:     #1 end-stage renal disease on dialysis. Patient being evaluated by [facility] renal transplantation program  #2 history of nonischemic cardiomyopathy with systolic failure. Requesting repeat echocardiogram per [facility] transplant program. Patient hopes to establish with cardiologist in [place], [place]  #3 type 2 diabetes. History of poor control and poor compliance. Improving with recent A1c 8.1%    Plan:     -Refer to cardiology in [place], [place] close follow-up with nephrology. He is getting dialysis every Monday, Wednesday, and Friday  [name] MD [facility]

## 2015-08-04 NOTE — Progress Notes (Signed)
Pre visit review using our clinic review tool, if applicable. No additional management support is needed unless otherwise documented below in the visit note. 

## 2015-08-13 ENCOUNTER — Ambulatory Visit (INDEPENDENT_AMBULATORY_CARE_PROVIDER_SITE_OTHER): Payer: BLUE CROSS/BLUE SHIELD | Admitting: Physician Assistant

## 2015-08-13 ENCOUNTER — Encounter: Payer: Self-pay | Admitting: Physician Assistant

## 2015-08-13 ENCOUNTER — Encounter: Payer: Self-pay | Admitting: *Deleted

## 2015-08-13 ENCOUNTER — Encounter: Payer: Self-pay | Admitting: Vascular Surgery

## 2015-08-13 VITALS — BP 138/68 | HR 83 | Ht 70.0 in | Wt 200.0 lb

## 2015-08-13 DIAGNOSIS — I10 Essential (primary) hypertension: Secondary | ICD-10-CM

## 2015-08-13 DIAGNOSIS — I429 Cardiomyopathy, unspecified: Secondary | ICD-10-CM | POA: Diagnosis not present

## 2015-08-13 DIAGNOSIS — I5022 Chronic systolic (congestive) heart failure: Secondary | ICD-10-CM | POA: Diagnosis not present

## 2015-08-13 DIAGNOSIS — I2510 Atherosclerotic heart disease of native coronary artery without angina pectoris: Secondary | ICD-10-CM

## 2015-08-13 NOTE — Progress Notes (Signed)
Cardiology Office Note Date:  08/13/2015  Patient ID:  Anthony Bullock 05/05/1960, MRN [id number] PCP:  [name], MD  Cardiologist:  Dr. Caryl Comes Nephrology: Dr. Lowanda Foster    Chief Complaint: routine device check  History of Present Illness: Anthony Bullock is a 55 y.o. male with history of NICM w/S-ICD, chronic CHF, systolic, CRI, now ESERF on HD DM, HTN, CAD with LHC in 1/15 demonstrated probable diabetic vascular disease. Initial echo in 12/14 with EF 25-30%.most recent echo in 3/15 with EF 25%, comes to the office today to be seen for Dr. Caryl Comes.  He last saw him in January, at that time continued to be burdened with edema, his CCB stopped and started on hydralazine and nitrate for his CM, diuretic/fluid management deferred to nephrology.  He was seen in f/u soon afterwards and doing well with up-titration of his Bidil at that time.   He has since been started on HD, about 1 month ago and his BP became significantly lower with weakness, dizziness, his nephrologist decreased his BiDil to daily with resolution of this.  He is tolerating HD well and in-fact feeling much better then he had been in the last several months.  He denies any kind of CP, palpitations, his breathing is significantly improved and denies any significant SOB at all, his edema nearly resolved.  They are looking into getting on the kidney transplant list and he needs an updated echo.  Device information: [employer], implanted 06/27/13 The patient states he has never been shockled   Past Medical History:  Diagnosis Date  . Allergic rhinitis   . Anemia   . Arthritis   . Chronic systolic heart failure ([place])    a. ECHO (12/2012) EF 25-30%, HK entireanteroseptal myocardium //  b.  EF 25%, diffuse HK, grade 1 diastolic dysfunction, MAC, mild LAE, normal RVSF, trivial pericardial effusion  . CKD (chronic kidney disease)   . COPD (chronic obstructive pulmonary disease) ([place])   . Diabetes mellitus type II   .  Diabetic nephropathy ([place])   . Diabetic neuropathy ([place])   . History of cardiac catheterization    a.Myoview 1/15:  There is significant left ventricular dysfunction. There may be slight scar at the apex. There is no significant ischemia. LV Ejection Fraction: 27%  //  b. RHC/LHC (1/15) with mean RA 6, PA 47/22 mean 33, mean PCWP 20, PVR 2.5 WU, CI 2.5; 80% dLAD stenosis, 70% diffuse large D.   . Hyperlipidemia   . Hypertension   . Kidney stones   . NICM (nonischemic cardiomyopathy) ([place])    Primarily nonischemic. Echo (12/14) with EF 25-30%. Echo (3/15) with EF 25%, mild to moderately dilated LV, normal RV size and systolic function.   . Pneumonia   . Urethral stricture     Past Surgical History:  Procedure Laterality Date  . BASCILIC VEIN TRANSPOSITION Right 08/22/2014   Procedure: RIGHT UPPER ARM BASCILIC VEIN TRANSPOSITION;  Surgeon: Angelia Mould, MD;  Location: [place];  Service: Vascular;  Laterality: Right;  . CARDIAC CATHETERIZATION    . CARDIAC DEFIBRILLATOR PLACEMENT  06/27/2013   Sub Q       BY DR Caryl Comes  . COLONOSCOPY WITH PROPOFOL N/A 07/22/2015   Procedure: COLONOSCOPY WITH PROPOFOL;  Surgeon: Doran Stabler, MD;  Location: [facility];  Service: Gastroenterology;  Laterality: N/A;  . IMPLANTABLE CARDIOVERTER DEFIBRILLATOR IMPLANT N/A 06/27/2013   Procedure: SUB Q ICD;  Surgeon: Deboraha Sprang, MD;  Location: [facility]  CATH LAB;  Service: Cardiovascular;  Laterality: N/A;  . LEFT A ND RIGHT HEART CATH  01/30/2013   DR BENSIHMON  . LEFT AND RIGHT HEART CATHETERIZATION WITH CORONARY ANGIOGRAM N/A 01/30/2013   Procedure: LEFT AND RIGHT HEART CATHETERIZATION WITH CORONARY ANGIOGRAM;  Surgeon: Jolaine Artist, MD;  Location: [facility] CATH LAB;  Service: Cardiovascular;  Laterality: N/A;  . PERIPHERAL VASCULAR CATHETERIZATION Right 01/26/2015   Procedure: A/V Fistulagram;  Surgeon: Angelia Mould, MD;  Location: [place] CV LAB;  Service: Cardiovascular;  Laterality:  Right;  . reapea urethral surgery for recurrent obstruction  2011  . TOTAL KNEE ARTHROPLASTY Right 2007    Current Outpatient Prescriptions  Medication Sig Dispense Refill  . aspirin EC 81 MG tablet Take 162 mg by mouth daily.    [name] atorvastatin (LIPITOR) 80 MG tablet Take 1 tablet (80 mg total) by mouth at bedtime. 90 tablet 3  . azelastine (ASTELIN) 0.1 % nasal spray Place 2 sprays into both nostrils 3 (three) times daily as needed for rhinitis. Use in each nostril as directed 30 mL 12  . BD PEN NEEDLE NANO U/F 32G X 4 MM MISC USE ONCE DAILY 90 each 2  . carvedilol (COREG) 25 MG tablet Take 1 tablet (25 mg total) by mouth 2 (two) times daily with a meal. 180 tablet 3  . fenofibrate 160 MG tablet Take 1 tablet (160 mg total) by mouth daily. 90 tablet 3  . fexofenadine (ALLEGRA) 180 MG tablet Take 180 mg by mouth daily.    [name] gabapentin (NEURONTIN) 300 MG capsule Take 1 capsule (300 mg total) by mouth 3 (three) times daily. 270 capsule 3  . glucose blood test strip Check 1 time daily. E11.9 One Touch Ultra Blue Test Strips 100 each 3  . HYDROcodone-acetaminophen (NORCO/VICODIN) 5-325 MG tablet Take 1 tablet by mouth every 6 (six) hours as needed for moderate pain. 30 tablet 0  . Insulin Glargine (LANTUS SOLOSTAR) 100 UNIT/ML Solostar Pen Inject 40 Units into the skin daily at 10 pm.     . insulin lispro (HUMALOG) 100 UNIT/ML injection Inject 0-14 Units into the skin 3 (three) times daily as needed for high blood sugar (per sliding scale).    . isosorbide-hydrALAZINE (BIDIL) 20-37.5 MG tablet Take 1 tablet by mouth 3 (three) times daily. (Patient taking differently: Take 1 tablet by mouth daily. ) 270 tablet 3  . Olopatadine HCl (PATADAY) 0.2 % SOLN Place 1 drop into both eyes daily as needed (for allergies).      No current facility-administered medications for this visit.     Allergies:   Morphine sulfate   Social History:  The patient  reports that he quit smoking about 5 years ago. His  smoking use included Cigarettes. He has a 64.00 pack-year smoking history. He has never used smokeless tobacco. He reports that he does not drink alcohol or use drugs.   Family History:  The patient's family history includes Alcohol abuse in his father; Bladder Cancer in his mother; Diabetes in his maternal grandmother; Heart Problems in his maternal grandmother; Heart disease in his maternal grandfather; Melanoma in his father; Prostate cancer in his maternal grandfather; Stroke in his maternal grandmother.  ROS:  Please see the history of present illness.   All other systems are reviewed and otherwise negative.   PHYSICAL EXAM:  VS:  BP 138/68   Pulse 83   Ht 5\' 10"  (1.778 m)   Wt 200 lb (90.7 kg)   BMI 28.70  kg/m  BMI: Body mass index is 28.7 kg/m. Well nourished, well developed, in no acute distress  HEENT: normocephalic, atraumatic  Neck: no JVD, carotid bruits or masses Cardiac:  RRR; no significant murmurs, no rubs, or gallops Lungs:  clear to auscultation bilaterally, no wheezing, rhonchi or rales  R chest dialysis catheter Abd: soft, nontender MS: no deformity or atrophy Ext: trace if any edema b/l LE.  RUE AVFistula (non-functioning per the patient) Skin: warm and dry, no rash Neuro:  No gross deficits appreciated Psych: euthymic mood, full affect  ICD site is stable, no tethering or discomfort   EKG:  Done today and reviewed by myself shows SR, nonspecific ST/T changes ICD interrogation today: no events, battery status is good, normal function  03/13/13: Echocardiogram Study Conclusions - Left ventricle: The cavity size was mildly to moderately dilated. Wall thickness was normal. The estimated ejection fraction was 25%. Diffuse hypokinesis. Doppler parameters are consistent with abnormal left ventricular relaxation (grade 1 diastolic dysfunction). - Aortic valve: There was no stenosis. - Mitral valve: Mildly calcified annulus. Trivial regurgitation. -  Left atrium: The atrium was mildly dilated. - Right ventricle: The cavity size was normal. Systolic function was normal. - Pulmonary arteries: No complete TR doppler jet so unable to estimate PA systolic pressure. - Inferior vena cava: The vessel was normal in size; the respirophasic diameter changes were in the normal range (= 50%); findings are consistent with normal central venous pressure. - Pericardium, extracardiac: A trivial pericardial effusion was identified. Impressions: - Mild to moderate LV dilation with global hypokinesis, EF 25%. Normal RV size and systolic function. No significant valvular abnormalities.  01/30/13: R/L heart cath, Dr. Haroldine Laws Assessment: 1. CAD with diffuse diabetic vasculopathy. Only high grade lesion is in small distal LAD 2. Well-compensated hemodynamics Plan/Discussion: He has diffuse diabetic vasculopathy but TIMI-3 flow throughout. Suspect he has NICM. Continue medical therapy. Will hydrate overnight and watch renal function. Home in am if stable.  Recent Labs: 09/29/2014: TSH 2.41 12/03/2014: Magnesium 2.1 06/25/2015: Hemoglobin 9.0 07/22/2015: BUN 14; Creatinine, Ser 4.11; Potassium 3.6; Sodium 138  No results found for requested labs within last 8760 hours.   CrCl cannot be calculated (Patient's most recent lab result is older than the maximum 21 days allowed.).   Wt Readings from Last 3 Encounters:  08/13/15 200 lb (90.7 kg)  08/04/15 205 lb (93 kg)  07/22/15 207 lb (93.9 kg)     Other studies reviewed: Additional studies/records reviewed today include: summarized above  ASSESSMENT AND PLAN:  1. Chronic systolic CHF  2. Nonischemic cardiomyopathy      S-ICD in place     Normal device function      3. HTN      Stable on current regime  4. CAD      No angina. Continue aspirin, statin, beta blocker.  5. CKD >>> ESRF now on HD about a month ago     Continue follow-up with nephrology   Disposition: Will  schedule him for an echocardiogram given he is pursuing renal transplant and they are requesting this, will see Dr. Caryl Comes in 69mo, sooner if needed.  Current medicines are reviewed at length with the patient today.  The patient did not have any concerns regarding medicines.  Haywood Lasso, PA-C 08/13/2015 4:15 PM     [place] [address] [phone] (office)  [phone] (fax)

## 2015-08-13 NOTE — Patient Instructions (Addendum)
Medication Instructions:   Your physician recommends that you continue on your current medications as directed. Please refer to the Current Medication list given to you today.  If you need a refill on your cardiac medications before your next appointment, please call your pharmacy.  Labwork: NONE ORDER TODAY    Testing/Procedures:  Your physician has requested that you have an echocardiogram. Echocardiography is a painless test that uses sound waves to create images of your heart. It provides your doctor with information about the size and shape of your heart and how well your heart's chambers and valves are working. This procedure takes approximately one hour. There are no restrictions for this procedure.    Follow-Up:  Your physician wants you to follow-up in:  [place]  Anthony Bullock will receive a reminder letter in the mail two months in advance. If you don't receive a letter, please call our office to schedule the follow-up appointment.     Any Other Special Instructions Will Be Listed Below (If Applicable).

## 2015-08-15 DIAGNOSIS — Z992 Dependence on renal dialysis: Secondary | ICD-10-CM | POA: Insufficient documentation

## 2015-08-19 ENCOUNTER — Ambulatory Visit (HOSPITAL_COMMUNITY)
Admission: RE | Admit: 2015-08-19 | Discharge: 2015-08-19 | Disposition: A | Discharge status: Home / Self Care | Payer: BLUE CROSS/BLUE SHIELD | Source: Ambulatory Visit | Attending: Vascular Surgery | Admitting: Vascular Surgery

## 2015-08-19 ENCOUNTER — Ambulatory Visit (INDEPENDENT_AMBULATORY_CARE_PROVIDER_SITE_OTHER): Payer: BLUE CROSS/BLUE SHIELD | Admitting: Vascular Surgery

## 2015-08-19 ENCOUNTER — Other Ambulatory Visit: Payer: Self-pay

## 2015-08-19 ENCOUNTER — Encounter: Payer: Self-pay | Admitting: Vascular Surgery

## 2015-08-19 VITALS — BP 155/76 | HR 75 | Temp 97.4°F | Resp 16 | Ht 70.0 in | Wt 200.0 lb

## 2015-08-19 DIAGNOSIS — T82868A Thrombosis of vascular prosthetic devices, implants and grafts, initial encounter: Secondary | ICD-10-CM | POA: Insufficient documentation

## 2015-08-19 DIAGNOSIS — E1122 Type 2 diabetes mellitus with diabetic chronic kidney disease: Secondary | ICD-10-CM | POA: Diagnosis not present

## 2015-08-19 DIAGNOSIS — J449 Chronic obstructive pulmonary disease, unspecified: Secondary | ICD-10-CM | POA: Insufficient documentation

## 2015-08-19 DIAGNOSIS — N186 End stage renal disease: Secondary | ICD-10-CM | POA: Diagnosis not present

## 2015-08-19 DIAGNOSIS — E785 Hyperlipidemia, unspecified: Secondary | ICD-10-CM | POA: Diagnosis not present

## 2015-08-19 DIAGNOSIS — I428 Other cardiomyopathies: Secondary | ICD-10-CM | POA: Diagnosis not present

## 2015-08-19 DIAGNOSIS — E1121 Type 2 diabetes mellitus with diabetic nephropathy: Secondary | ICD-10-CM | POA: Insufficient documentation

## 2015-08-19 DIAGNOSIS — E1140 Type 2 diabetes mellitus with diabetic neuropathy, unspecified: Secondary | ICD-10-CM | POA: Diagnosis not present

## 2015-08-19 DIAGNOSIS — I132 Hypertensive heart and chronic kidney disease with heart failure and with stage 5 chronic kidney disease, or end stage renal disease: Secondary | ICD-10-CM | POA: Insufficient documentation

## 2015-08-19 DIAGNOSIS — Z4931 Encounter for adequacy testing for hemodialysis: Secondary | ICD-10-CM | POA: Diagnosis not present

## 2015-08-19 DIAGNOSIS — I5022 Chronic systolic (congestive) heart failure: Secondary | ICD-10-CM | POA: Insufficient documentation

## 2015-08-19 DIAGNOSIS — Y838 Other surgical procedures as the cause of abnormal reaction of the patient, or of later complication, without mention of misadventure at the time of the procedure: Secondary | ICD-10-CM | POA: Insufficient documentation

## 2015-08-19 NOTE — Progress Notes (Signed)
[facility] [place]  Patient name: Anthony Bullock MRN: [id number] DOB: October 07, 1960 Sex: male  REASON FOR VISIT: Follow up after hemodialysis access  HPI: Anthony Bullock is a 55 y.o. male who I last saw on for 22. He underwent a right basilic vein transposition on 08/22/2014. On 01/26/2015 he had venoplasty of a right basilic vein stenosis. At that time he was going to be seen in [place] to be evaluated for transplant. He comes in today because his graft is occluded.  He does HD on MWF. He has a right IJ TDC.   Past Medical History:  Diagnosis Date  . Allergic rhinitis   . Anemia   . Arthritis   . Chronic systolic heart failure ([place])    a. ECHO (12/2012) EF 25-30%, HK entireanteroseptal myocardium //  b.  EF 25%, diffuse HK, grade 1 diastolic dysfunction, MAC, mild LAE, normal RVSF, trivial pericardial effusion  . CKD (chronic kidney disease)   . COPD (chronic obstructive pulmonary disease) ([place])   . Diabetes mellitus type II   . Diabetic nephropathy ([place])   . Diabetic neuropathy ([place])   . History of cardiac catheterization    a.Myoview 1/15:  There is significant left ventricular dysfunction. There may be slight scar at the apex. There is no significant ischemia. LV Ejection Fraction: 27%  //  b. RHC/LHC (1/15) with mean RA 6, PA 47/22 mean 33, mean PCWP 20, PVR 2.5 WU, CI 2.5; 80% dLAD stenosis, 70% diffuse large D.   . Hyperlipidemia   . Hypertension   . Kidney stones   . NICM (nonischemic cardiomyopathy) ([place])    Primarily nonischemic. Echo (12/14) with EF 25-30%. Echo (3/15) with EF 25%, mild to moderately dilated LV, normal RV size and systolic function.   . Pneumonia   . Urethral stricture     Family History  Problem Relation Age of Onset  . Bladder Cancer Mother   . Alcohol abuse Father   . Melanoma Father   . Stroke Maternal Grandmother   . Heart Problems Maternal Grandmother     unknown  . Diabetes Maternal Grandmother   . Heart  disease Maternal Grandfather   . Prostate cancer Maternal Grandfather     SOCIAL HISTORY: Social History  Substance Use Topics  . Smoking status: Former Smoker    Packs/day: 2.00    Years: 32.00    Types: Cigarettes    Quit date: 05/11/2010  . Smokeless tobacco: Never Used  . Alcohol use No    Allergies  Allergen Reactions  . Morphine Sulfate Rash    REACTION: Itches all over, red spots    Current Outpatient Prescriptions  Medication Sig Dispense Refill  . aspirin EC 81 MG tablet Take 162 mg by mouth daily.    [name] atorvastatin (LIPITOR) 80 MG tablet Take 1 tablet (80 mg total) by mouth at bedtime. 90 tablet 3  . azelastine (ASTELIN) 0.1 % nasal spray Place 2 sprays into both nostrils 3 (three) times daily as needed for rhinitis. Use in each nostril as directed 30 mL 12  . BD PEN NEEDLE NANO U/F 32G X 4 MM MISC USE ONCE DAILY 90 each 2  . carvedilol (COREG) 25 MG tablet Take 1 tablet (25 mg total) by mouth 2 (two) times daily with a meal. 180 tablet 3  . fenofibrate 160 MG tablet Take 1 tablet (160 mg total) by mouth daily. 90 tablet 3  . fexofenadine (ALLEGRA) 180 MG tablet Take 180  mg by mouth daily.    [name] gabapentin (NEURONTIN) 300 MG capsule Take 1 capsule (300 mg total) by mouth 3 (three) times daily. 270 capsule 3  . glucose blood test strip Check 1 time daily. E11.9 One Touch Ultra Blue Test Strips 100 each 3  . HYDROcodone-acetaminophen (NORCO/VICODIN) 5-325 MG tablet Take 1 tablet by mouth every 6 (six) hours as needed for moderate pain. 30 tablet 0  . Insulin Glargine (LANTUS SOLOSTAR) 100 UNIT/ML Solostar Pen Inject 40 Units into the skin daily at 10 pm.     . insulin lispro (HUMALOG) 100 UNIT/ML injection Inject 0-14 Units into the skin 3 (three) times daily as needed for high blood sugar (per sliding scale).    . isosorbide-hydrALAZINE (BIDIL) 20-37.5 MG tablet Take 1 tablet by mouth 3 (three) times daily. (Patient taking differently: Take 1 tablet by mouth daily. ) 270  tablet 3  . Olopatadine HCl (PATADAY) 0.2 % SOLN Place 1 drop into both eyes daily as needed (for allergies).      No current facility-administered medications for this visit.     REVIEW OF SYSTEMS:  [X]  denotes positive finding, [ ]  denotes negative finding Cardiac  Comments:  Chest pain or chest pressure:    Shortness of breath upon exertion:    Short of breath when lying flat:    Irregular heart rhythm:        Vascular    Pain in calf, thigh, or hip brought on by ambulation:    Pain in feet at night that wakes you up from your sleep:     Blood clot in your veins:    Leg swelling:         Pulmonary    Oxygen at home:    Productive cough:     Wheezing:         Neurologic    Sudden weakness in arms or legs:     Sudden numbness in arms or legs:     Sudden onset of difficulty speaking or slurred speech:    Temporary loss of vision in one eye:     Problems with dizziness:         Gastrointestinal    Blood in stool:     Vomited blood:         Genitourinary    Burning when urinating:     Blood in urine:        Psychiatric    Major depression:         Hematologic    Bleeding problems:    Problems with blood clotting too easily:        Skin    Rashes or ulcers:        Constitutional    Fever or chills:      PHYSICAL EXAM: Vitals:   08/19/15 0912 08/19/15 0914  BP: (!) 154/78 (!) 155/76  Pulse: 75   Resp: 16   Temp: 97.4 F (36.3 C)   SpO2: 98%   Weight: 200 lb (90.7 kg)   Height: 5\' 10"  (1.778 m)     GENERAL: The patient is a well-nourished male, in no acute distress. The vital signs are documented above. CARDIAC: There is a regular rate and rhythm.  VASCULAR: There is no thrill in his right upper arm fistula. He has a palpable radial pulse bilaterally. PULMONARY: There is good air exchange bilaterally without wheezing or rales. ABDOMEN: Soft and non-tender with normal pitched bowel sounds.  MUSCULOSKELETAL: There are no major deformities  or  cyanosis. NEUROLOGIC: No focal weakness or paresthesias are detected. SKIN: There are no ulcers or rashes noted. PSYCHIATRIC: The patient has a normal affect.  DATA:   His last vein map of the left arm was in August 2016 and the veins looked adequate although they also looked adequate on the right side.  MEDICAL ISSUES:  STAGE V CHRONIC KIDNEY DISEASE: His right basilic vein transposition is occluded. I've explained that the options would be to consider placement of a fistula in the left arm which I would favor versus placement of an AV graft. He is frustrated and would like to proceed with placement of a graft as he feels that this will be the quickest solution to having new access that can be used and he can get his catheter out. I have discussed the advantages of attempting a fistula whenever possible and he appeared to have an adequate vein last year although he would need another vein map in the left arm. However, he feels strongly about having an AV graft in the right arm and for this reason this has been scheduled on a nondialysis day, 09/08/2015. We have discussed the procedure potential complications and he is agreeable to proceed.    [name] [facility] [phone]

## 2015-08-21 ENCOUNTER — Encounter: Payer: Self-pay | Admitting: Nephrology

## 2015-08-21 ENCOUNTER — Ambulatory Visit: Payer: BLUE CROSS/BLUE SHIELD | Admitting: Cardiology

## 2015-08-27 ENCOUNTER — Ambulatory Visit (HOSPITAL_COMMUNITY): Payer: BLUE CROSS/BLUE SHIELD | Attending: Cardiovascular Disease

## 2015-08-27 ENCOUNTER — Other Ambulatory Visit: Payer: Self-pay

## 2015-08-27 DIAGNOSIS — Z87891 Personal history of nicotine dependence: Secondary | ICD-10-CM | POA: Diagnosis not present

## 2015-08-27 DIAGNOSIS — I429 Cardiomyopathy, unspecified: Secondary | ICD-10-CM | POA: Diagnosis not present

## 2015-08-27 DIAGNOSIS — N184 Chronic kidney disease, stage 4 (severe): Secondary | ICD-10-CM | POA: Insufficient documentation

## 2015-08-27 DIAGNOSIS — I130 Hypertensive heart and chronic kidney disease with heart failure and stage 1 through stage 4 chronic kidney disease, or unspecified chronic kidney disease: Secondary | ICD-10-CM | POA: Diagnosis not present

## 2015-08-27 DIAGNOSIS — D649 Anemia, unspecified: Secondary | ICD-10-CM | POA: Insufficient documentation

## 2015-08-27 DIAGNOSIS — I2510 Atherosclerotic heart disease of native coronary artery without angina pectoris: Secondary | ICD-10-CM | POA: Diagnosis not present

## 2015-08-27 DIAGNOSIS — J449 Chronic obstructive pulmonary disease, unspecified: Secondary | ICD-10-CM | POA: Insufficient documentation

## 2015-08-27 DIAGNOSIS — I509 Heart failure, unspecified: Secondary | ICD-10-CM | POA: Diagnosis not present

## 2015-08-27 DIAGNOSIS — E1122 Type 2 diabetes mellitus with diabetic chronic kidney disease: Secondary | ICD-10-CM | POA: Insufficient documentation

## 2015-08-27 DIAGNOSIS — E785 Hyperlipidemia, unspecified: Secondary | ICD-10-CM | POA: Diagnosis not present

## 2015-08-27 LAB — ECHOCARDIOGRAM COMPLETE
Ao-asc: 33 cm
E decel time: 218 msec
E/e' ratio: 9.98
FS: 29 % (ref 28–44)
IVS/LV PW RATIO, ED: 0.91
LA ID, A-P, ES: 43 mm
LA diam end sys: 43 mm
LA diam index: 2.06 cm/m2
LA vol A4C: 52 ml
LA vol index: 26.3 mL/m2
LA vol: 55 mL
LV E/e' medial: 9.98
LV E/e'average: 9.98
LV PW d: 12.1 mm — AB (ref 0.6–1.1)
LV e' LATERAL: 5.79 cm/s
LVOT SV: 88 mL
LVOT VTI: 19.5 cm
LVOT area: 4.52 cm2
LVOT diameter: 24 mm
LVOT peak vel: 79.4 cm/s
Lateral S' vel: 18.2 cm/s
MV Dec: 218
MV pk A vel: 84.4 m/s
MV pk E vel: 57.8 m/s
TDI e' lateral: 5.79
TDI e' medial: 4.29

## 2015-08-28 ENCOUNTER — Telehealth: Payer: Self-pay | Admitting: *Deleted

## 2015-08-28 NOTE — Telephone Encounter (Signed)
SPOKE TO PT ABOUT RESULTS AND VERBALIZED UNDERSTANDING  

## 2015-08-28 NOTE — Telephone Encounter (Signed)
-----   Message from [place], [place] sent at 08/27/2015  3:03 PM EDT ----- Please let the patient know that his echo looked good, heart muscle strength appears to have improved significantly, back to normal.  Thanks Renee.

## 2015-08-28 NOTE — Telephone Encounter (Signed)
-----   Message from [place], [place] sent at 08/27/2015  3:03 PM EDT ----- Please let the patient know that his echo looked good, heart muscle strength appears to have improved significantly, back to normal.  Thanks Renee.

## 2015-08-28 NOTE — Telephone Encounter (Signed)
LMOVM TO CALL BACK OFFICE FOR RESULTS

## 2015-09-01 ENCOUNTER — Encounter: Payer: Self-pay | Admitting: Cardiology

## 2015-09-01 ENCOUNTER — Ambulatory Visit (INDEPENDENT_AMBULATORY_CARE_PROVIDER_SITE_OTHER): Payer: BLUE CROSS/BLUE SHIELD | Admitting: Cardiology

## 2015-09-01 VITALS — BP 171/74 | HR 81 | Ht 70.0 in | Wt 202.8 lb

## 2015-09-01 DIAGNOSIS — I5022 Chronic systolic (congestive) heart failure: Secondary | ICD-10-CM | POA: Diagnosis not present

## 2015-09-01 DIAGNOSIS — I10 Essential (primary) hypertension: Secondary | ICD-10-CM | POA: Diagnosis not present

## 2015-09-01 DIAGNOSIS — I2510 Atherosclerotic heart disease of native coronary artery without angina pectoris: Secondary | ICD-10-CM | POA: Diagnosis not present

## 2015-09-01 DIAGNOSIS — E785 Hyperlipidemia, unspecified: Secondary | ICD-10-CM

## 2015-09-01 MED ORDER — CARVEDILOL 12.5 MG PO TABS: 12.5000 mg | ORAL_TABLET | Freq: Two times a day (BID) | ORAL | 3 refills | Status: DC

## 2015-09-01 NOTE — Patient Instructions (Signed)
Your physician wants you to follow-up in: [place] DR. BRANCH You will receive a reminder letter in the mail two months in advance. If you don't receive a letter, please call our office to schedule the follow-up appointment.  Your physician has recommended you make the following change in your medication:   STOP BIDIL   CHANGE COREG 12.5 MG TWICE DAILY   Your physician has requested that you regularly monitor and record your blood pressure readings at home FOR 2 WEEKS. Please use the same machine at the same time of day to check your readings and record them to bring to your follow-up visit.  Thank you for choosing [facility]!!

## 2015-09-01 NOTE — Progress Notes (Signed)
Clinical Summary Mr. Forslund is a 55 y.o.male seen today for follow up of the following medical problems.    1. Chronic systolic HF/NICM - echo in 2015 LVEF 25%. Most recent echo 08/2015 LVEF has improved to 50-55%. - has had some of his CHF meds decreased due to low bp's on HD.  - has ICD  - denies any significant SOB.  - bidil casued lightheadness/dizziness.  - coreg down to 25mg  once a day.   2. ESRD - being considered for transplant at [facility]   3. HTN - has not taken meds yet today. Seems to have high bp's on non HD days, and issues with low BPs during HD sessions.   4. CAD - cath 2015 with diffuse nonobstructive disease, worst lesion 80% small distal LAD.  - no recent chest pain.   5. Hyperlipidemia - 06/2015 TC 224 TG 225 HDL 35 LDL 144 - conpliant with statin, appears also on fenofibrate  6. DM2 - 06/2015 HgbA1C 8.9 - followed by pcp  7. Carotid stenosis - followed by vascular.   Past Medical History:  Diagnosis Date  . Allergic rhinitis   . Anemia   . Arthritis   . Chronic systolic heart failure ([place])    a. ECHO (12/2012) EF 25-30%, HK entireanteroseptal myocardium //  b.  EF 25%, diffuse HK, grade 1 diastolic dysfunction, MAC, mild LAE, normal RVSF, trivial pericardial effusion  . CKD (chronic kidney disease)   . COPD (chronic obstructive pulmonary disease) ([place])   . Diabetes mellitus type II   . Diabetic nephropathy ([place])   . Diabetic neuropathy ([place])   . History of cardiac catheterization    a.Myoview 1/15:  There is significant left ventricular dysfunction. There may be slight scar at the apex. There is no significant ischemia. LV Ejection Fraction: 27%  //  b. RHC/LHC (1/15) with mean RA 6, PA 47/22 mean 33, mean PCWP 20, PVR 2.5 WU, CI 2.5; 80% dLAD stenosis, 70% diffuse large D.   . Hyperlipidemia   . Hypertension   . Kidney stones   . NICM (nonischemic cardiomyopathy) ([place])    Primarily nonischemic. Echo (12/14) with EF 25-30%. Echo  (3/15) with EF 25%, mild to moderately dilated LV, normal RV size and systolic function.   . Pneumonia   . Urethral stricture      Allergies  Allergen Reactions  . Morphine Sulfate Rash    REACTION: Itches all over, red spots     Current Outpatient Prescriptions  Medication Sig Dispense Refill  . aspirin EC 81 MG tablet Take 162 mg by mouth daily.    [name] atorvastatin (LIPITOR) 80 MG tablet Take 1 tablet (80 mg total) by mouth at bedtime. 90 tablet 3  . azelastine (ASTELIN) 0.1 % nasal spray Place 2 sprays into both nostrils 3 (three) times daily as needed for rhinitis. Use in each nostril as directed 30 mL 12  . BD PEN NEEDLE NANO U/F 32G X 4 MM MISC USE ONCE DAILY 90 each 2  . carvedilol (COREG) 25 MG tablet Take 1 tablet (25 mg total) by mouth 2 (two) times daily with a meal. 180 tablet 3  . fenofibrate 160 MG tablet Take 1 tablet (160 mg total) by mouth daily. 90 tablet 3  . fexofenadine (ALLEGRA) 180 MG tablet Take 180 mg by mouth daily.    [name] gabapentin (NEURONTIN) 300 MG capsule Take 1 capsule (300 mg total) by mouth 3 (three) times daily. 270 capsule 3  .  glucose blood test strip Check 1 time daily. E11.9 One Touch Ultra Blue Test Strips 100 each 3  . HYDROcodone-acetaminophen (NORCO/VICODIN) 5-325 MG tablet Take 1 tablet by mouth every 6 (six) hours as needed for moderate pain. 30 tablet 0  . Insulin Glargine (LANTUS SOLOSTAR) 100 UNIT/ML Solostar Pen Inject 40 Units into the skin daily at 10 pm.     . insulin lispro (HUMALOG) 100 UNIT/ML injection Inject 0-14 Units into the skin 3 (three) times daily as needed for high blood sugar (per sliding scale).    . isosorbide-hydrALAZINE (BIDIL) 20-37.5 MG tablet Take 1 tablet by mouth 3 (three) times daily. (Patient taking differently: Take 1 tablet by mouth daily. ) 270 tablet 3  . Olopatadine HCl (PATADAY) 0.2 % SOLN Place 1 drop into both eyes daily as needed (for allergies).      No current facility-administered medications for this  visit.      Past Surgical History:  Procedure Laterality Date  . BASCILIC VEIN TRANSPOSITION Right 08/22/2014   Procedure: RIGHT UPPER ARM BASCILIC VEIN TRANSPOSITION;  Surgeon: Angelia Mould, MD;  Location: [facility];  Service: Vascular;  Laterality: Right;  . CARDIAC CATHETERIZATION    . CARDIAC DEFIBRILLATOR PLACEMENT  06/27/2013   Sub Q       BY DR Caryl Comes  . COLONOSCOPY WITH PROPOFOL N/A 07/22/2015   Procedure: COLONOSCOPY WITH PROPOFOL;  Surgeon: Doran Stabler, MD;  Location: [facility];  Service: Gastroenterology;  Laterality: N/A;  . IMPLANTABLE CARDIOVERTER DEFIBRILLATOR IMPLANT N/A 06/27/2013   Procedure: SUB Q ICD;  Surgeon: Deboraha Sprang, MD;  [facility] CATH LAB;  Service: Cardiovascular;  Laterality: N/A;  . LEFT A ND RIGHT HEART CATH  01/30/2013   DR BENSIHMON  . LEFT AND RIGHT HEART CATHETERIZATION WITH CORONARY ANGIOGRAM N/A 01/30/2013   Procedure: LEFT AND RIGHT HEART CATHETERIZATION WITH CORONARY ANGIOGRAM;  Surgeon: Jolaine Artist, MD;  Location: [facility] CATH LAB;  Service: Cardiovascular;  Laterality: N/A;  . PERIPHERAL VASCULAR CATHETERIZATION Right 01/26/2015   Procedure: A/V Fistulagram;  Surgeon: Angelia Mould, MD;  Location: [facility] CV LAB;  Service: Cardiovascular;  Laterality: Right;  . reapea urethral surgery for recurrent obstruction  2011  . TOTAL KNEE ARTHROPLASTY Right 2007     Allergies  Allergen Reactions  . Morphine Sulfate Rash    REACTION: Itches all over, red spots      Family History  Problem Relation Age of Onset  . Bladder Cancer Mother   . Alcohol abuse Father   . Melanoma Father   . Stroke Maternal Grandmother   . Heart Problems Maternal Grandmother     unknown  . Diabetes Maternal Grandmother   . Heart disease Maternal Grandfather   . Prostate cancer Maternal Grandfather      Social History Mr. Buchmann reports that he quit smoking about 5 years ago. His smoking use included Cigarettes. He has a 64.00 pack-year  smoking history. He has never used smokeless tobacco. Mr. Copelin reports that he does not drink alcohol.   Review of Systems CONSTITUTIONAL: No weight loss, fever, chills, weakness or fatigue.  HEENT: Eyes: No visual loss, blurred vision, double vision or yellow sclerae.No hearing loss, sneezing, congestion, runny nose or sore throat.  SKIN: No rash or itching.  CARDIOVASCULAR: per HPI RESPIRATORY: No shortness of breath, cough or sputum.  GASTROINTESTINAL: No anorexia, nausea, vomiting or diarrhea. No abdominal pain or blood.  GENITOURINARY: No burning on urination, no polyuria NEUROLOGICAL: No headache, dizziness,  syncope, paralysis, ataxia, numbness or tingling in the extremities. No change in bowel or bladder control.  MUSCULOSKELETAL: No muscle, back pain, joint pain or stiffness.  LYMPHATICS: No enlarged nodes. No history of splenectomy.  PSYCHIATRIC: No history of depression or anxiety.  ENDOCRINOLOGIC: No reports of sweating, cold or heat intolerance. No polyuria or polydipsia.  [name]   Physical Examination Vitals:   09/01/15 1010  BP: (!) 171/74  Pulse: 81   Vitals:   09/01/15 1010  Weight: 202 lb 12.8 oz (92 kg)  Height: 5\' 10"  (1.778 m)    Gen: resting comfortably, no acute distress HEENT: no scleral icterus, pupils equal round and reactive, no palptable cervical adenopathy,  CV: RRR, no m/r/g, no jvd Resp: Clear to auscultation bilaterally GI: abdomen is soft, non-tender, non-distended, normal bowel sounds, no hepatosplenomegaly MSK: extremities are warm, no edema.  Skin: warm, no rash Neuro:  no focal deficits Psych: appropriate affect   Diagnostic Studies 08/2015 echo Study Conclusions  - Left ventricle: The cavity size was normal. Systolic function was   normal. The estimated ejection fraction was in the range of 50%   to 55%. Wall motion was normal; there were no regional wall   motion abnormalities. Doppler parameters are consistent with   abnormal left  ventricular relaxation (grade 1 diastolic   dysfunction). - Left atrium: The atrium was mildly dilated. - Atrial septum: No defect or patent foramen ovale was identified.  Jan 2015 cath Findings:  RA = 6 RV = 48/5/7 PA =  47/22 (33) PCW = 20 Fick cardiac output/index = 5.2/2.5 PVR = 2.5 WU SVR = 1192 FA sat = 98% PA sat = 65%, 68%  Ao Pressure: 116/64 (84) LV Pressure: 122/13/18 There was no signficant gradient across the aortic valve on pullback.  Left main: Mild ostial tapering otherwise ok  LAD: Narrow vessel (due to diffuse diabetic vasculopathy). 30% ostial. 40-50% tubular lesion  in the mid section. 80% lesion distally. Large diagonal with moderate diffuse disease and 70% lesion in midsection  LCX: Non dominant vessel with diffuse diabetic vasculopathy. Large OM-1. Small OM-2 and OM-3. 2 PLs. 30% mid AV groove CX lesion. 40% lesion in proximal OM-1  RCA: Large dominant vessel with just mild plaque  Assessment: 1. CAD with diffuse diabetic vasculopathy. Only high grade lesion is in small distal LAD 2. Well-compensated hemodynamics  Plan/Discussion:  He has diffuse diabetic vasculopathy but TIMI-3 flow throughout. Suspect he has NICM. Continue medical therapy. Will hydrate overnight and watch renal function. Home in am if stable.  Assessment and Plan  1. Chronic systolic HF - LVEF has now normalized based on last echo - medical therapy has been limited due to low bp's during HD, as well as dizziness - we will stop bidil due to dizziness. He will try taking coreg 12.5mg  bid.  - with normalized LVEF and side effects, aggressive titration of CHF meds no longer warranted  2. HTN - changes as described above. May need alternate regimens for HD and nonHD days - he will submit bp log in 2 weeks.   3. CAD - nonobstructive disease, continue risk factor modification. No current symptoms  4. Hyperlipidemia - conitnue high dose. Would consider stopping  fenofibrate due to lack of clinically proven benefit  5. Carotid stenosis - followed by vascular   F/u 6 months     Arnoldo Lenis, M.D.

## 2015-09-07 ENCOUNTER — Encounter (HOSPITAL_COMMUNITY): Payer: Self-pay | Admitting: *Deleted

## 2015-09-07 NOTE — Progress Notes (Signed)
   09/07/15 1442  OBSTRUCTIVE SLEEP APNEA  Have you ever been diagnosed with sleep apnea through a sleep study? No  Do you snore loudly (loud enough to be heard through closed doors)?  1  Do you often feel tired, fatigued, or sleepy during the daytime (such as falling asleep during driving or talking to someone)? 0  Has anyone observed you stop breathing during your sleep? 0  Do you have, or are you being treated for high blood pressure? 1  BMI more than 35 kg/m2? 0  Age > 50 (1-yes) 1  Neck circumference greater than:Male 16 inches or larger, Male 17inches or larger? 1  Male Gender (Yes=1) 1  Obstructive Sleep Apnea Score 5

## 2015-09-07 NOTE — Progress Notes (Signed)
Anesthesia Chart Review:  Pt is a same day work up.   Pt is a 55 year old male scheduled for insertion of AV gore-tex graft in R arm on 09/08/2015 with [name], MD.   - Cardiologist is Carlyle Dolly, MD.  - EP cardiologist is Virl Axe, last office visit 08/13/15 with Tommye Standard, PA.  - PCP is Carolann Littler, MD.  - Nephrologist is Elmarie Shiley, MD  PMH includes:  CAD (nonobstructive by 2015 cath), NICM, subcutaneous ICD [facility] [employer], implanted [id number]), chronic systolic HF, HTN, DM, hyperlipidemia, ESRD on dialysis (being considered for kidney transplant at [facility]), COPD, carotid stenosis. Former smoker. BMI 29  Medications include: ASA, lipitor, carvedilol, fenofibrate, lantus, humalog.   Labs will be obtained DOS.   EKG 08/13/15: NSR. Nonspecific ST and T wave abnormality.   Echo 08/27/15:  - Left ventricle: The cavity size was normal. Systolic function was normal. The estimated ejection fraction was in the range of 50% to 55%. Wall motion was normal; there were no regional wall motion abnormalities. Doppler parameters are consistent with abnormal left ventricular relaxation (grade 1 diastolic dysfunction). - Left atrium: The atrium was mildly dilated. - Atrial septum: No defect or patent foramen ovale was identified.  Carotid duplex 08/18/14: B ICA stenosis <40%  Cardiac cath 01/30/13:  1. CAD with diffuse diabetic vasculopathy. Only high grade lesion is in small distal LAD (80%) 2. Well-compensated hemodynamics  Nuclear stress test 01/15/13: There is significant left ventricular dysfunction. There may be slight scar at the apex. There is no significant ischemia. LV Ejection Fraction: 27%.  LV Wall Motion:  Global hypokinesis. Significant left ventricular dilatation.  If labs acceptable DOS, I anticipate pt can proceed as scheduled.   Willeen Cass, FNP-BC [facility]/Anesthesiology Phone: [phone] 09/07/2015 2:01 PM

## 2015-09-07 NOTE — Anesthesia Preprocedure Evaluation (Addendum)
Anesthesia Evaluation  Patient identified by MRN, date of birth, ID band Patient awake    Reviewed: Allergy & Precautions, NPO status , Patient's Chart, lab work & pertinent test results, reviewed documented beta blocker date and time   History of Anesthesia Complications Negative for: history of anesthetic complications  Airway Mallampati: III  TM Distance: >3 FB Neck ROM: Full    Dental  (+) Dental Advisory Given, Teeth Intact   Pulmonary neg shortness of breath, neg sleep apnea, COPD, neg recent URI, former smoker,    Pulmonary exam normal breath sounds clear to auscultation       Cardiovascular hypertension, Pt. on medications and Pt. on home beta blockers (-) angina+ CAD, + Past MI and +CHF (EF 50-55%, NICM, grade I diastolic dysfunction)  (-) Cardiac Stents, (-) Orthopnea and (-) PND (-) dysrhythmias + Cardiac Defibrillator [employer])  Rhythm:Regular Rate:Normal  EKG 08/13/15: NSR. Nonspecific ST and T wave abnormality.   Echo 08/27/15:  - Left ventricle: The cavity size was normal. Systolic function was normal. The estimated ejection fraction was in the range of 50% to 55%. Wall motion was normal; there were no regional wall motion abnormalities. Doppler parameters are consistent with abnormal left ventricular relaxation (grade 1 diastolic dysfunction). - Left atrium: The atrium was mildly dilated. - Atrial septum: No defect or patent foramen ovale was identified.  Carotid duplex 08/18/14: B ICA stenosis <40%  Cardiac cath 01/30/13:  1. CAD with diffuse diabetic vasculopathy. Only high grade lesion is in small distal LAD (80%) 2. Well-compensated hemodynamics  Nuclear stress test 01/15/13: There is significant left ventricular dysfunction. There may be slight scar at the apex. There is no significant ischemia. LV Ejection Fraction: 27%. LV Wall Motion: Global hypokinesis. Significant left ventricular dilatation.    Neuro/Psych neg Seizures PSYCHIATRIC DISORDERS (adjustment disorder with depressed mood)  Neuromuscular disease (diabetic peripheral neuropathy)    GI/Hepatic negative GI ROS, Neg liver ROS, neg GERD  ,  Endo/Other  diabetes, Poorly Controlled, Type 2, Insulin Dependent  Renal/GU ESRF and DialysisRenal disease     Musculoskeletal  (+) Arthritis ,   Abdominal   Peds  Hematology  (+) Blood dyscrasia, anemia ,   Anesthesia Other Findings HLD  Reproductive/Obstetrics                            Anesthesia Physical Anesthesia Plan  ASA: IV  Anesthesia Plan: MAC   Post-op Pain Management:    Induction:   Airway Management Planned: Natural Airway and Nasal Cannula  Additional Equipment:   Intra-op Plan:   Post-operative Plan:   Informed Consent: I have reviewed the patients History and Physical, chart, labs and discussed the procedure including the risks, benefits and alternatives for the proposed anesthesia with the patient or authorized representative who has indicated his/her understanding and acceptance.   Dental advisory given  Plan Discussed with:   Anesthesia Plan Comments: (ICD recently interrogated. No shocks delivered. Not dependent on pacemaker. Will place magnet over ICD during surgery.)       Anesthesia Quick Evaluation

## 2015-09-07 NOTE — Progress Notes (Signed)
Pt denies SOB and chest pain but is under the care of Dr. Har[facility]. Pt stated that his fasting blood glucose ranges from 120-150. Pt made aware of diabetes protocol to check BS, interventions for a BS<70 and > 220. Pt made aware to take half dose of Lantus tonight (15 units). Pt made aware to stop taking otc vitamins, fish oil, herbal medications and NSAID's. Pt verbalized understanding of all pre-op instructions. Pt chart forwarded to anesthesia for review of cardiac history.

## 2015-09-08 ENCOUNTER — Ambulatory Visit (HOSPITAL_COMMUNITY): Payer: BLUE CROSS/BLUE SHIELD | Admitting: Emergency Medicine

## 2015-09-08 ENCOUNTER — Encounter (HOSPITAL_COMMUNITY): Payer: Self-pay | Admitting: General Practice

## 2015-09-08 ENCOUNTER — Encounter (HOSPITAL_COMMUNITY)
Admission: RE | Disposition: A | Discharge status: Home / Self Care | Payer: Self-pay | Source: Ambulatory Visit | Attending: Vascular Surgery

## 2015-09-08 ENCOUNTER — Ambulatory Visit (HOSPITAL_COMMUNITY)
Admission: RE | Admit: 2015-09-08 | Discharge: 2015-09-08 | Disposition: A | Discharge status: Home / Self Care | Payer: BLUE CROSS/BLUE SHIELD | Source: Ambulatory Visit | Attending: Vascular Surgery | Admitting: Vascular Surgery

## 2015-09-08 DIAGNOSIS — I5022 Chronic systolic (congestive) heart failure: Secondary | ICD-10-CM | POA: Insufficient documentation

## 2015-09-08 DIAGNOSIS — I132 Hypertensive heart and chronic kidney disease with heart failure and with stage 5 chronic kidney disease, or end stage renal disease: Secondary | ICD-10-CM | POA: Insufficient documentation

## 2015-09-08 DIAGNOSIS — I2510 Atherosclerotic heart disease of native coronary artery without angina pectoris: Secondary | ICD-10-CM | POA: Insufficient documentation

## 2015-09-08 DIAGNOSIS — Z794 Long term (current) use of insulin: Secondary | ICD-10-CM | POA: Insufficient documentation

## 2015-09-08 DIAGNOSIS — Z79899 Other long term (current) drug therapy: Secondary | ICD-10-CM | POA: Insufficient documentation

## 2015-09-08 DIAGNOSIS — T82898A Other specified complication of vascular prosthetic devices, implants and grafts, initial encounter: Secondary | ICD-10-CM | POA: Insufficient documentation

## 2015-09-08 DIAGNOSIS — I252 Old myocardial infarction: Secondary | ICD-10-CM | POA: Insufficient documentation

## 2015-09-08 DIAGNOSIS — I428 Other cardiomyopathies: Secondary | ICD-10-CM | POA: Diagnosis not present

## 2015-09-08 DIAGNOSIS — Z7982 Long term (current) use of aspirin: Secondary | ICD-10-CM | POA: Insufficient documentation

## 2015-09-08 DIAGNOSIS — E1165 Type 2 diabetes mellitus with hyperglycemia: Secondary | ICD-10-CM | POA: Diagnosis not present

## 2015-09-08 DIAGNOSIS — E1122 Type 2 diabetes mellitus with diabetic chronic kidney disease: Secondary | ICD-10-CM | POA: Diagnosis not present

## 2015-09-08 DIAGNOSIS — N185 Chronic kidney disease, stage 5: Secondary | ICD-10-CM | POA: Insufficient documentation

## 2015-09-08 DIAGNOSIS — Y832 Surgical operation with anastomosis, bypass or graft as the cause of abnormal reaction of the patient, or of later complication, without mention of misadventure at the time of the procedure: Secondary | ICD-10-CM | POA: Insufficient documentation

## 2015-09-08 DIAGNOSIS — J449 Chronic obstructive pulmonary disease, unspecified: Secondary | ICD-10-CM | POA: Diagnosis not present

## 2015-09-08 DIAGNOSIS — J309 Allergic rhinitis, unspecified: Secondary | ICD-10-CM | POA: Diagnosis not present

## 2015-09-08 DIAGNOSIS — Z87891 Personal history of nicotine dependence: Secondary | ICD-10-CM | POA: Diagnosis not present

## 2015-09-08 DIAGNOSIS — E785 Hyperlipidemia, unspecified: Secondary | ICD-10-CM | POA: Insufficient documentation

## 2015-09-08 HISTORY — PX: AV FISTULA PLACEMENT: SHX1204

## 2015-09-08 HISTORY — DX: Presence of automatic (implantable) cardiac defibrillator: Z95.810

## 2015-09-08 LAB — GLUCOSE, CAPILLARY
Glucose-Capillary: 55 mg/dL — ABNORMAL LOW (ref 65–99)
Glucose-Capillary: 159 mg/dL — ABNORMAL HIGH (ref 65–99)
Glucose-Capillary: 128 mg/dL — ABNORMAL HIGH (ref 65–99)

## 2015-09-08 LAB — POCT I-STAT 4, (NA,K, GLUC, HGB,HCT)
Glucose, Bld: 62 mg/dL — ABNORMAL LOW (ref 65–99)
HCT: 33 % — ABNORMAL LOW (ref 39.0–52.0)
Hemoglobin: 11.2 g/dL — ABNORMAL LOW (ref 13.0–17.0)
Potassium: 3.7 mmol/L (ref 3.5–5.1)
Sodium: 137 mmol/L (ref 135–145)

## 2015-09-08 SURGERY — INSERTION OF ARTERIOVENOUS (AV) GORE-TEX GRAFT ARM
Anesthesia: Monitor Anesthesia Care | Site: Arm Upper | Laterality: Right

## 2015-09-08 MED ORDER — HEPARIN SODIUM (PORCINE) 1000 UNIT/ML IJ SOLN
INTRAMUSCULAR | Status: AC
  Filled 2015-09-08: qty 1

## 2015-09-08 MED ORDER — SODIUM CHLORIDE 0.9 % IV SOLN
INTRAVENOUS | Status: DC | PRN
  Administered 2015-09-08: 07:00:00

## 2015-09-08 MED ORDER — FENTANYL CITRATE (PF) 100 MCG/2ML IJ SOLN: 25.0000 ug | INTRAMUSCULAR | Status: DC | PRN

## 2015-09-08 MED ORDER — MIDAZOLAM HCL 5 MG/5ML IJ SOLN
INTRAMUSCULAR | Status: DC | PRN
  Administered 2015-09-08: 2 mg via INTRAVENOUS

## 2015-09-08 MED ORDER — LIDOCAINE-EPINEPHRINE (PF) 1 %-1:200000 IJ SOLN
INTRAMUSCULAR | Status: DC | PRN
  Administered 2015-09-08: 30 mL

## 2015-09-08 MED ORDER — SODIUM CHLORIDE 0.9 % IV SOLN: INTRAVENOUS | Status: DC

## 2015-09-08 MED ORDER — CEFUROXIME SODIUM 1.5 G IJ SOLR
1.5000 g | INTRAMUSCULAR | Status: AC
  Administered 2015-09-08: 1.5 g via INTRAVENOUS

## 2015-09-08 MED ORDER — DEXTROSE 50 % IV SOLN
INTRAVENOUS | Status: AC
  Filled 2015-09-08: qty 50

## 2015-09-08 MED ORDER — LIDOCAINE HCL (CARDIAC) 20 MG/ML IV SOLN
INTRAVENOUS | Status: DC | PRN
  Administered 2015-09-08: 100 mg via INTRATRACHEAL

## 2015-09-08 MED ORDER — HEPARIN SODIUM (PORCINE) 1000 UNIT/ML IJ SOLN
INTRAMUSCULAR | Status: DC | PRN
  Administered 2015-09-08: 7000 [IU] via INTRAVENOUS

## 2015-09-08 MED ORDER — HYDROCODONE-ACETAMINOPHEN 5-325 MG PO TABS: 1.0000 | ORAL_TABLET | Freq: Four times a day (QID) | ORAL | 0 refills | Status: DC | PRN

## 2015-09-08 MED ORDER — DEXTROSE 5 % IV SOLN
INTRAVENOUS | Status: AC
  Filled 2015-09-08: qty 1.5

## 2015-09-08 MED ORDER — FENTANYL CITRATE (PF) 100 MCG/2ML IJ SOLN
INTRAMUSCULAR | Status: DC | PRN
  Administered 2015-09-08 (×2): 50 ug via INTRAVENOUS

## 2015-09-08 MED ORDER — LIDOCAINE-EPINEPHRINE (PF) 1 %-1:200000 IJ SOLN
INTRAMUSCULAR | Status: AC
  Filled 2015-09-08: qty 30

## 2015-09-08 MED ORDER — CHLORHEXIDINE GLUCONATE CLOTH 2 % EX PADS
6.0000 | MEDICATED_PAD | Freq: Once | CUTANEOUS | Status: DC
Start: 2015-09-08 — End: 2015-09-08

## 2015-09-08 MED ORDER — PHENYLEPHRINE HCL 10 MG/ML IJ SOLN
INTRAMUSCULAR | Status: DC | PRN
  Administered 2015-09-08 (×2): 80 ug via INTRAVENOUS
  Administered 2015-09-08: 40 ug via INTRAVENOUS
  Administered 2015-09-08: 80 ug via INTRAVENOUS
  Administered 2015-09-08: 120 ug via INTRAVENOUS

## 2015-09-08 MED ORDER — SODIUM CHLORIDE 0.9 % IV SOLN
INTRAVENOUS | Status: DC
  Administered 2015-09-08 (×2): via INTRAVENOUS

## 2015-09-08 MED ORDER — 0.9 % SODIUM CHLORIDE (POUR BTL) OPTIME
TOPICAL | Status: DC | PRN
  Administered 2015-09-08: 1000 mL

## 2015-09-08 MED ORDER — PROPOFOL 10 MG/ML IV BOLUS
INTRAVENOUS | Status: AC
  Filled 2015-09-08: qty 20

## 2015-09-08 MED ORDER — MIDAZOLAM HCL 2 MG/2ML IJ SOLN
INTRAMUSCULAR | Status: AC
  Filled 2015-09-08: qty 2

## 2015-09-08 MED ORDER — DEXTROSE 5 % IV SOLN
INTRAVENOUS | Status: DC | PRN
  Administered 2015-09-08: 20 ug/min via INTRAVENOUS

## 2015-09-08 MED ORDER — LIDOCAINE HCL (PF) 1 % IJ SOLN
INTRAMUSCULAR | Status: AC
  Filled 2015-09-08: qty 30

## 2015-09-08 MED ORDER — LIDOCAINE 2% (20 MG/ML) 5 ML SYRINGE
INTRAMUSCULAR | Status: AC
  Filled 2015-09-08: qty 5

## 2015-09-08 MED ORDER — FENTANYL CITRATE (PF) 100 MCG/2ML IJ SOLN
INTRAMUSCULAR | Status: AC
  Filled 2015-09-08: qty 2

## 2015-09-08 MED ORDER — PROPOFOL 500 MG/50ML IV EMUL
INTRAVENOUS | Status: DC | PRN
  Administered 2015-09-08: 100 ug/kg/min via INTRAVENOUS

## 2015-09-08 MED ORDER — DEXTROSE 50 % IV SOLN
50.0000 mL | Freq: Once | INTRAVENOUS | Status: AC
  Administered 2015-09-08: 50 mL via INTRAVENOUS
  Filled 2015-09-08: qty 50

## 2015-09-08 MED ORDER — PROTAMINE SULFATE 10 MG/ML IV SOLN
INTRAVENOUS | Status: DC | PRN
  Administered 2015-09-08: 20 mg via INTRAVENOUS
  Administered 2015-09-08: 10 mg via INTRAVENOUS

## 2015-09-08 MED ORDER — PROMETHAZINE HCL 25 MG/ML IJ SOLN: 6.2500 mg | INTRAMUSCULAR | Status: DC | PRN

## 2015-09-08 SURGICAL SUPPLY — 33 items
ARMBAND PINK RESTRICT EXTREMIT (MISCELLANEOUS) ×6 IMPLANT
CANISTER SUCTION 2500CC (MISCELLANEOUS) ×3 IMPLANT
CANNULA VESSEL 3MM 2 BLNT TIP (CANNULA) ×3 IMPLANT
CLIP TI MEDIUM 6 (CLIP) ×3 IMPLANT
CLIP TI WIDE RED SMALL 6 (CLIP) ×5 IMPLANT
COVER PROBE W GEL 5X96 (DRAPES) ×2 IMPLANT
DECANTER SPIKE VIAL GLASS SM (MISCELLANEOUS) ×3 IMPLANT
ELECT REM PT RETURN 9FT ADLT (ELECTROSURGICAL) ×3
ELECTRODE REM PT RTRN 9FT ADLT (ELECTROSURGICAL) ×1 IMPLANT
GLOVE BIO SURGEON STRL SZ 6 (GLOVE) ×4 IMPLANT
GLOVE BIO SURGEON STRL SZ 6.5 (GLOVE) ×2 IMPLANT
GLOVE BIO SURGEON STRL SZ7.5 (GLOVE) ×3 IMPLANT
GLOVE BIO SURGEONS STRL SZ 6.5 (GLOVE) ×2
GLOVE BIOGEL PI IND STRL 8 (GLOVE) ×1 IMPLANT
GLOVE BIOGEL PI INDICATOR 8 (GLOVE) ×2
GLOVE SURG SS PI 7.0 STRL IVOR (GLOVE) ×4 IMPLANT
GOWN STRL REUS W/ TWL LRG LVL3 (GOWN DISPOSABLE) ×3 IMPLANT
GOWN STRL REUS W/ TWL XL LVL3 (GOWN DISPOSABLE) IMPLANT
GOWN STRL REUS W/TWL LRG LVL3 (GOWN DISPOSABLE) ×9
GOWN STRL REUS W/TWL XL LVL3 (GOWN DISPOSABLE) ×3
GRAFT GORETEX STRT 4-7X45 (Vascular Products) ×2 IMPLANT
KIT BASIN OR (CUSTOM PROCEDURE TRAY) ×3 IMPLANT
KIT ROOM TURNOVER OR (KITS) ×3 IMPLANT
LIQUID BAND (GAUZE/BANDAGES/DRESSINGS) ×3 IMPLANT
NS IRRIG 1000ML POUR BTL (IV SOLUTION) ×3 IMPLANT
PACK CV ACCESS (CUSTOM PROCEDURE TRAY) ×3 IMPLANT
PAD ARMBOARD 7.5X6 YLW CONV (MISCELLANEOUS) ×6 IMPLANT
SUT PROLENE 6 0 BV (SUTURE) ×8 IMPLANT
SUT VIC AB 3-0 SH 27 (SUTURE) ×6
SUT VIC AB 3-0 SH 27X BRD (SUTURE) ×2 IMPLANT
SUT VICRYL 4-0 PS2 18IN ABS (SUTURE) ×6 IMPLANT
UNDERPAD 30X30 (UNDERPADS AND DIAPERS) ×3 IMPLANT
WATER STERILE IRR 1000ML POUR (IV SOLUTION) ×3 IMPLANT

## 2015-09-08 NOTE — Anesthesia Postprocedure Evaluation (Signed)
Anesthesia Post Note  Patient: Anthony Bullock  Procedure(s) Performed: Procedure(s) (LRB): INSERTION OF 4-39mm x 45cm  ARTERIOVENOUS (AV) GORE-TEX GRAFT RIGHT UPPER  ARM (Right)  Patient location during evaluation: PACU Anesthesia Type: MAC Level of consciousness: awake and alert Pain management: pain level controlled Vital Signs Assessment: post-procedure vital signs reviewed and stable Respiratory status: spontaneous breathing, nonlabored ventilation, respiratory function stable and patient connected to nasal cannula oxygen Cardiovascular status: stable and blood pressure returned to baseline Anesthetic complications: no    Last Vitals:  Vitals:   09/08/15 0945 09/08/15 0950  BP:  123/62  Pulse: 76 76  Resp: 15 (!) 22  Temp:      Last Pain:  Vitals:   09/08/15 0945  TempSrc:   PainSc: 0-No pain                 Nilda Simmer

## 2015-09-08 NOTE — Interval H&P Note (Signed)
History and Physical Interval Note:  09/08/2015 7:20 AM  Anthony Bullock  has presented today for surgery, with the diagnosis of Stage V Chronic Kidney Disease N18.5  The various methods of treatment have been discussed with the patient and family. After consideration of risks, benefits and other options for treatment, the patient has consented to  Procedure(s): INSERTION OF ARTERIOVENOUS (AV) GORE-TEX GRAFT ARM (Right) as a surgical intervention .  The patient's history has been reviewed, patient examined, no change in status, stable for surgery.  I have reviewed the patient's chart and labs.  Questions were answered to the patient's satisfaction.     [name]

## 2015-09-08 NOTE — Discharge Instructions (Signed)
° ° °  09/08/2015 Anthony Bullock [id number] 07/05/1960  Surgeon(s): Angelia Mould, MD  Procedure(s): INSERTION OF 4-63mm x 45cm  ARTERIOVENOUS (AV) GORE-TEX GRAFT RIGHT UPPER  ARM  x Do not stick graft for 4 weeks

## 2015-09-08 NOTE — Progress Notes (Signed)
Hypoglycemic Event  CBG: 55  Treatment: D50 IV 50 mL  Symptoms: Hungry  Follow-up CBG: [id number] CBG Result: 159  Possible Reasons for Event: Inadequate meal intake  Comments/MD notified:Dr. Kalman Shan - received verbal order to give 1 amp of dextrose    Anthony Bullock

## 2015-09-08 NOTE — Progress Notes (Signed)
Patient has [place], states he has never been shocked, and is not pacemaker dependent.  Patient states that he sees Dr. Caryl Comes and that the ICD has been interrogated within the month.  Dr. Cheral Bay aware and states that with previous procedure a magnet was used and [employer] does not need to be called at this time.  ICD emergency orders placed on chart.

## 2015-09-08 NOTE — Transfer of Care (Signed)
Immediate Anesthesia Transfer of Care Note  Patient: Anthony Bullock  Procedure(s) Performed: Procedure(s): INSERTION OF 4-67mm x 45cm  ARTERIOVENOUS (AV) GORE-TEX GRAFT RIGHT UPPER  ARM (Right)  Patient Location: PACU  Anesthesia Type:MAC  Level of Consciousness: awake, alert  and patient cooperative  Airway & Oxygen Therapy: Patient Spontanous Breathing and Patient connected to nasal cannula oxygen  Post-op Assessment: Report given to RN and Post -op Vital signs reviewed and stable  Post vital signs: Reviewed and stable  Last Vitals:  Vitals:   09/08/15 0602 09/08/15 0920  BP: (!) 167/78 (!) 98/55  Pulse: 82 74  Resp: 18 15  Temp: 36.8 C 36.6 C    Last Pain:  Vitals:   09/08/15 0602  TempSrc: Oral      Patients Stated Pain Goal: 2 ([id number])  Complications: No apparent anesthesia complications

## 2015-09-08 NOTE — H&P (View-Only) (Signed)
[facility]  Patient name: Anthony Bullock MRN: [id number] DOB: Dec 16, 1960 Sex: male  REASON FOR VISIT: Follow up after hemodialysis access  HPI: BILLEY TOEWS is a 55 y.o. male who I last saw on for 14. He underwent a right basilic vein transposition on 08/22/2014. On 01/26/2015 he had venoplasty of a right basilic vein stenosis. At that time he was going to be seen in [place] to be evaluated for transplant. He comes in today because his graft is occluded.  He does HD on MWF. He has a right IJ TDC.   Past Medical History:  Diagnosis Date  . Allergic rhinitis   . Anemia   . Arthritis   . Chronic systolic heart failure ([place])    a. ECHO (12/2012) EF 25-30%, HK entireanteroseptal myocardium //  b.  EF 25%, diffuse HK, grade 1 diastolic dysfunction, MAC, mild LAE, normal RVSF, trivial pericardial effusion  . CKD (chronic kidney disease)   . COPD (chronic obstructive pulmonary disease) ([place])   . Diabetes mellitus type II   . Diabetic nephropathy ([place])   . Diabetic neuropathy ([place])   . History of cardiac catheterization    a.Myoview 1/15:  There is significant left ventricular dysfunction. There may be slight scar at the apex. There is no significant ischemia. LV Ejection Fraction: 27%  //  b. RHC/LHC (1/15) with mean RA 6, PA 47/22 mean 33, mean PCWP 20, PVR 2.5 WU, CI 2.5; 80% dLAD stenosis, 70% diffuse large D.   . Hyperlipidemia   . Hypertension   . Kidney stones   . NICM (nonischemic cardiomyopathy) ([place])    Primarily nonischemic. Echo (12/14) with EF 25-30%. Echo (3/15) with EF 25%, mild to moderately dilated LV, normal RV size and systolic function.   . Pneumonia   . Urethral stricture     Family History  Problem Relation Age of Onset  . Bladder Cancer Mother   . Alcohol abuse Father   . Melanoma Father   . Stroke Maternal Grandmother   . Heart Problems Maternal Grandmother     unknown  . Diabetes Maternal Grandmother   . Heart  disease Maternal Grandfather   . Prostate cancer Maternal Grandfather     SOCIAL HISTORY: Social History  Substance Use Topics  . Smoking status: Former Smoker    Packs/day: 2.00    Years: 32.00    Types: Cigarettes    Quit date: 05/11/2010  . Smokeless tobacco: Never Used  . Alcohol use No    Allergies  Allergen Reactions  . Morphine Sulfate Rash    REACTION: Itches all over, red spots    Current Outpatient Prescriptions  Medication Sig Dispense Refill  . aspirin EC 81 MG tablet Take 162 mg by mouth daily.    [name] atorvastatin (LIPITOR) 80 MG tablet Take 1 tablet (80 mg total) by mouth at bedtime. 90 tablet 3  . azelastine (ASTELIN) 0.1 % nasal spray Place 2 sprays into both nostrils 3 (three) times daily as needed for rhinitis. Use in each nostril as directed 30 mL 12  . BD PEN NEEDLE NANO U/F 32G X 4 MM MISC USE ONCE DAILY 90 each 2  . carvedilol (COREG) 25 MG tablet Take 1 tablet (25 mg total) by mouth 2 (two) times daily with a meal. 180 tablet 3  . fenofibrate 160 MG tablet Take 1 tablet (160 mg total) by mouth daily. 90 tablet 3  . fexofenadine (ALLEGRA) 180 MG tablet Take 180  mg by mouth daily.    [name] gabapentin (NEURONTIN) 300 MG capsule Take 1 capsule (300 mg total) by mouth 3 (three) times daily. 270 capsule 3  . glucose blood test strip Check 1 time daily. E11.9 One Touch Ultra Blue Test Strips 100 each 3  . HYDROcodone-acetaminophen (NORCO/VICODIN) 5-325 MG tablet Take 1 tablet by mouth every 6 (six) hours as needed for moderate pain. 30 tablet 0  . Insulin Glargine (LANTUS SOLOSTAR) 100 UNIT/ML Solostar Pen Inject 40 Units into the skin daily at 10 pm.     . insulin lispro (HUMALOG) 100 UNIT/ML injection Inject 0-14 Units into the skin 3 (three) times daily as needed for high blood sugar (per sliding scale).    . isosorbide-hydrALAZINE (BIDIL) 20-37.5 MG tablet Take 1 tablet by mouth 3 (three) times daily. (Patient taking differently: Take 1 tablet by mouth daily. ) 270  tablet 3  . Olopatadine HCl (PATADAY) 0.2 % SOLN Place 1 drop into both eyes daily as needed (for allergies).      No current facility-administered medications for this visit.     REVIEW OF SYSTEMS:  [X]  denotes positive finding, [ ]  denotes negative finding Cardiac  Comments:  Chest pain or chest pressure:    Shortness of breath upon exertion:    Short of breath when lying flat:    Irregular heart rhythm:        Vascular    Pain in calf, thigh, or hip brought on by ambulation:    Pain in feet at night that wakes you up from your sleep:     Blood clot in your veins:    Leg swelling:         Pulmonary    Oxygen at home:    Productive cough:     Wheezing:         Neurologic    Sudden weakness in arms or legs:     Sudden numbness in arms or legs:     Sudden onset of difficulty speaking or slurred speech:    Temporary loss of vision in one eye:     Problems with dizziness:         Gastrointestinal    Blood in stool:     Vomited blood:         Genitourinary    Burning when urinating:     Blood in urine:        Psychiatric    Major depression:         Hematologic    Bleeding problems:    Problems with blood clotting too easily:        Skin    Rashes or ulcers:        Constitutional    Fever or chills:      PHYSICAL EXAM: Vitals:   08/19/15 0912 08/19/15 0914  BP: (!) 154/78 (!) 155/76  Pulse: 75   Resp: 16   Temp: 97.4 F (36.3 C)   SpO2: 98%   Weight: 200 lb (90.7 kg)   Height: 5\' 10"  (1.778 m)     GENERAL: The patient is a well-nourished male, in no acute distress. The vital signs are documented above. CARDIAC: There is a regular rate and rhythm.  VASCULAR: There is no thrill in his right upper arm fistula. He has a palpable radial pulse bilaterally. PULMONARY: There is good air exchange bilaterally without wheezing or rales. ABDOMEN: Soft and non-tender with normal pitched bowel sounds.  MUSCULOSKELETAL: There are no major deformities  or  cyanosis. NEUROLOGIC: No focal weakness or paresthesias are detected. SKIN: There are no ulcers or rashes noted. PSYCHIATRIC: The patient has a normal affect.  DATA:   His last vein map of the left arm was in August 2016 and the veins looked adequate although they also looked adequate on the right side.  MEDICAL ISSUES:  STAGE V CHRONIC KIDNEY DISEASE: His right basilic vein transposition is occluded. I've explained that the options would be to consider placement of a fistula in the left arm which I would favor versus placement of an AV graft. He is frustrated and would like to proceed with placement of a graft as he feels that this will be the quickest solution to having new access that can be used and he can get his catheter out. I have discussed the advantages of attempting a fistula whenever possible and he appeared to have an adequate vein last year although he would need another vein map in the left arm. However, he feels strongly about having an AV graft in the right arm and for this reason this has been scheduled on a nondialysis day, 09/08/2015. We have discussed the procedure potential complications and he is agreeable to proceed.    [name] [facility] [phone]

## 2015-09-08 NOTE — Anesthesia Procedure Notes (Signed)
Procedure Name: MAC Date/Time: 09/08/2015 7:36 AM Performed by: Salli Quarry Kamorah Nevils Pre-anesthesia Checklist: Patient identified, Emergency Drugs available, Suction available, Patient being monitored and Timeout performed Patient Re-evaluated:Patient Re-evaluated prior to inductionOxygen Delivery Method: Nasal cannula

## 2015-09-08 NOTE — Op Note (Signed)
    NAME: Anthony Bullock   MRN: [id number] DOB: 1960-07-26    DATE OF OPERATION: 09/08/2015  PREOP DIAGNOSIS: Stage V chronic kidney disease  POSTOP DIAGNOSIS: Same  PROCEDURE: Right upper arm AV graft (4-7 mm graft)  SURGEON: Judeth Cornfield. Scot Dock, MD, FACS  ASSIST: Leontine Locket, PA  ANESTHESIA: local with sedation   EBL: mminimal  INDICATIONS: [name] is a 55 y.o. male ho presents for new access. He has an occluded right basilic vein transposition.  FINDINGS: the brachial vein narrowed down at the antecubital level and therefore I felt  It was necessary to place an upper arm graft. The patient had a radial signal with the Doppler and a palmaris arch signal with the Doppler at the completion of the procedure. I was unable to attain and ulnar signal.  TECHNIQUE: The patient was taken to the operating room and sedated by anesthesia. The right upper extremity was prepped and draped in usual sterile fashion. I looked with the ultrasound it looked that the brachial vein narrowed just above the antecubital level. I therefore elected to place an upper arm graft. After the skin was anesthetized, an oblique incision was made above the antecubital level and the brachial artery was dissected free above the level of the anastomosis from the previous basilic vein transposition. A separate longitudinal incision was made beneath the axilla after the skin was anesthetized. High brachial vein was dissected free. There were 2 brachial veins and I took the more lateral vein before it joined to form a common axillary vein. A 4-7 mm PTFE graft was then tunneled between the 2 incisions after the skin was anesthetized. The patient was heparinized. A segment of the 4 mm end of the graft was excised, the graft slightly spatulated. The brachial artery was clamped proximally and distally and a longitudinal arteriotomy was made. The graft was sewn end-to-side to the artery using continuous 6-0 Prolene suture. The  graft and poorly prepared length for anastomosis to the brachial vein. The vein was ligated distally and spatulated proximally. The grafts cut to proper length, spatulated, and sewn into into the vein using continuous 6-0 Prolene suture. The dictation was an excellent thrill in the fistula. Hemostasis was obtained and the wounds and the heparin was partially reversed with protamine. The wounds were closed with the peritoneal Vicryl to skin closed with 4-0 Vicryl. Sterile dressing was applied. The patient tolerated the procedure well and transferred to the recovery room in stable condition. All needle and sponge counts were correct.  [name], MD, FACS [facility]  DATE OF DICTATION:   09/08/2015

## 2015-09-09 ENCOUNTER — Encounter (HOSPITAL_COMMUNITY): Payer: Self-pay | Admitting: Vascular Surgery

## 2015-11-29 ENCOUNTER — Encounter (HOSPITAL_COMMUNITY): Payer: Self-pay | Admitting: Nephrology

## 2015-11-29 ENCOUNTER — Inpatient Hospital Stay (HOSPITAL_COMMUNITY)
Admission: AD | Admit: 2015-11-29 | Discharge: 2015-12-01 | DRG: 628 | Disposition: A | Discharge status: Home / Self Care | Payer: BLUE CROSS/BLUE SHIELD | Source: Other Acute Inpatient Hospital | Attending: Internal Medicine | Admitting: Internal Medicine

## 2015-11-29 DIAGNOSIS — I428 Other cardiomyopathies: Secondary | ICD-10-CM | POA: Diagnosis present

## 2015-11-29 DIAGNOSIS — I132 Hypertensive heart and chronic kidney disease with heart failure and with stage 5 chronic kidney disease, or end stage renal disease: Secondary | ICD-10-CM | POA: Diagnosis present

## 2015-11-29 DIAGNOSIS — Z8042 Family history of malignant neoplasm of prostate: Secondary | ICD-10-CM

## 2015-11-29 DIAGNOSIS — E1121 Type 2 diabetes mellitus with diabetic nephropathy: Secondary | ICD-10-CM | POA: Diagnosis present

## 2015-11-29 DIAGNOSIS — N186 End stage renal disease: Secondary | ICD-10-CM | POA: Diagnosis present

## 2015-11-29 DIAGNOSIS — F4321 Adjustment disorder with depressed mood: Secondary | ICD-10-CM | POA: Diagnosis present

## 2015-11-29 DIAGNOSIS — T82858A Stenosis of vascular prosthetic devices, implants and grafts, initial encounter: Secondary | ICD-10-CM | POA: Diagnosis present

## 2015-11-29 DIAGNOSIS — E8770 Fluid overload, unspecified: Principal | ICD-10-CM | POA: Diagnosis present

## 2015-11-29 DIAGNOSIS — Z8249 Family history of ischemic heart disease and other diseases of the circulatory system: Secondary | ICD-10-CM | POA: Diagnosis not present

## 2015-11-29 DIAGNOSIS — Z9181 History of falling: Secondary | ICD-10-CM | POA: Diagnosis not present

## 2015-11-29 DIAGNOSIS — Z794 Long term (current) use of insulin: Secondary | ICD-10-CM

## 2015-11-29 DIAGNOSIS — J189 Pneumonia, unspecified organism: Secondary | ICD-10-CM | POA: Diagnosis present

## 2015-11-29 DIAGNOSIS — Z95810 Presence of automatic (implantable) cardiac defibrillator: Secondary | ICD-10-CM

## 2015-11-29 DIAGNOSIS — Z87891 Personal history of nicotine dependence: Secondary | ICD-10-CM

## 2015-11-29 DIAGNOSIS — Z833 Family history of diabetes mellitus: Secondary | ICD-10-CM

## 2015-11-29 DIAGNOSIS — M159 Polyosteoarthritis, unspecified: Secondary | ICD-10-CM | POA: Diagnosis present

## 2015-11-29 DIAGNOSIS — E1122 Type 2 diabetes mellitus with diabetic chronic kidney disease: Secondary | ICD-10-CM | POA: Diagnosis present

## 2015-11-29 DIAGNOSIS — Z7982 Long term (current) use of aspirin: Secondary | ICD-10-CM | POA: Diagnosis not present

## 2015-11-29 DIAGNOSIS — I5023 Acute on chronic systolic (congestive) heart failure: Secondary | ICD-10-CM | POA: Diagnosis present

## 2015-11-29 DIAGNOSIS — E785 Hyperlipidemia, unspecified: Secondary | ICD-10-CM | POA: Diagnosis present

## 2015-11-29 DIAGNOSIS — T82868A Thrombosis of vascular prosthetic devices, implants and grafts, initial encounter: Secondary | ICD-10-CM | POA: Diagnosis present

## 2015-11-29 DIAGNOSIS — Z823 Family history of stroke: Secondary | ICD-10-CM

## 2015-11-29 DIAGNOSIS — Y832 Surgical operation with anastomosis, bypass or graft as the cause of abnormal reaction of the patient, or of later complication, without mention of misadventure at the time of the procedure: Secondary | ICD-10-CM | POA: Diagnosis present

## 2015-11-29 DIAGNOSIS — I509 Heart failure, unspecified: Secondary | ICD-10-CM

## 2015-11-29 DIAGNOSIS — E1165 Type 2 diabetes mellitus with hyperglycemia: Secondary | ICD-10-CM | POA: Diagnosis present

## 2015-11-29 DIAGNOSIS — E1129 Type 2 diabetes mellitus with other diabetic kidney complication: Secondary | ICD-10-CM | POA: Diagnosis present

## 2015-11-29 DIAGNOSIS — I10 Essential (primary) hypertension: Secondary | ICD-10-CM

## 2015-11-29 DIAGNOSIS — E1142 Type 2 diabetes mellitus with diabetic polyneuropathy: Secondary | ICD-10-CM | POA: Diagnosis present

## 2015-11-29 DIAGNOSIS — N184 Chronic kidney disease, stage 4 (severe): Secondary | ICD-10-CM | POA: Diagnosis present

## 2015-11-29 DIAGNOSIS — Z8052 Family history of malignant neoplasm of bladder: Secondary | ICD-10-CM

## 2015-11-29 DIAGNOSIS — I2510 Atherosclerotic heart disease of native coronary artery without angina pectoris: Secondary | ICD-10-CM | POA: Diagnosis present

## 2015-11-29 DIAGNOSIS — Z96651 Presence of right artificial knee joint: Secondary | ICD-10-CM | POA: Diagnosis present

## 2015-11-29 DIAGNOSIS — Z885 Allergy status to narcotic agent status: Secondary | ICD-10-CM

## 2015-11-29 DIAGNOSIS — R0602 Shortness of breath: Secondary | ICD-10-CM | POA: Diagnosis present

## 2015-11-29 DIAGNOSIS — T829XXA Unspecified complication of cardiac and vascular prosthetic device, implant and graft, initial encounter: Secondary | ICD-10-CM

## 2015-11-29 DIAGNOSIS — J449 Chronic obstructive pulmonary disease, unspecified: Secondary | ICD-10-CM | POA: Diagnosis present

## 2015-11-29 DIAGNOSIS — Z992 Dependence on renal dialysis: Secondary | ICD-10-CM

## 2015-11-29 DIAGNOSIS — Z808 Family history of malignant neoplasm of other organs or systems: Secondary | ICD-10-CM

## 2015-11-29 DIAGNOSIS — I5022 Chronic systolic (congestive) heart failure: Secondary | ICD-10-CM | POA: Diagnosis present

## 2015-11-29 DIAGNOSIS — E1169 Type 2 diabetes mellitus with other specified complication: Secondary | ICD-10-CM | POA: Diagnosis present

## 2015-11-29 DIAGNOSIS — E1151 Type 2 diabetes mellitus with diabetic peripheral angiopathy without gangrene: Secondary | ICD-10-CM | POA: Diagnosis present

## 2015-11-29 DIAGNOSIS — J9601 Acute respiratory failure with hypoxia: Secondary | ICD-10-CM

## 2015-11-29 DIAGNOSIS — I739 Peripheral vascular disease, unspecified: Secondary | ICD-10-CM | POA: Diagnosis present

## 2015-11-29 DIAGNOSIS — Z87442 Personal history of urinary calculi: Secondary | ICD-10-CM

## 2015-11-29 HISTORY — DX: Dependence on renal dialysis: Z99.2

## 2015-11-29 HISTORY — DX: End stage renal disease: N18.6

## 2015-11-29 LAB — GLUCOSE, CAPILLARY
Glucose-Capillary: 340 mg/dL — ABNORMAL HIGH (ref 65–99)
Glucose-Capillary: 304 mg/dL — ABNORMAL HIGH (ref 65–99)
Glucose-Capillary: 272 mg/dL — ABNORMAL HIGH (ref 65–99)
Glucose-Capillary: 205 mg/dL — ABNORMAL HIGH (ref 65–99)

## 2015-11-29 LAB — COMPREHENSIVE METABOLIC PANEL
ALT: 14 U/L — ABNORMAL LOW (ref 17–63)
AST: 17 U/L (ref 15–41)
Albumin: 2.6 g/dL — ABNORMAL LOW (ref 3.5–5.0)
Alkaline Phosphatase: 45 U/L (ref 38–126)
Anion gap: 12 (ref 5–15)
BUN: 45 mg/dL — ABNORMAL HIGH (ref 6–20)
CO2: 24 mmol/L (ref 22–32)
Calcium: 8 mg/dL — ABNORMAL LOW (ref 8.9–10.3)
Chloride: 97 mmol/L — ABNORMAL LOW (ref 101–111)
Creatinine, Ser: 7.11 mg/dL — ABNORMAL HIGH (ref 0.61–1.24)
GFR calc Af Amer: 9 mL/min — ABNORMAL LOW (ref >60)
GFR calc non Af Amer: 8 mL/min — ABNORMAL LOW (ref >60)
Glucose, Bld: 371 mg/dL — ABNORMAL HIGH (ref 65–99)
Potassium: 3.9 mmol/L (ref 3.5–5.1)
Sodium: 133 mmol/L — ABNORMAL LOW (ref 135–145)
Total Bilirubin: 0.7 mg/dL (ref 0.3–1.2)
Total Protein: 5.8 g/dL — ABNORMAL LOW (ref 6.5–8.1)

## 2015-11-29 LAB — PROTIME-INR
INR: 1.15
Prothrombin Time: 14.8 seconds (ref 11.4–15.2)

## 2015-11-29 LAB — CBC WITH DIFFERENTIAL/PLATELET
Basophils Absolute: 0 10*3/uL (ref 0.0–0.1)
Basophils Relative: 0 %
Eosinophils Absolute: 0 10*3/uL (ref 0.0–0.7)
Eosinophils Relative: 0 %
HCT: 27.5 % — ABNORMAL LOW (ref 39.0–52.0)
Hemoglobin: 9 g/dL — ABNORMAL LOW (ref 13.0–17.0)
Lymphocytes Relative: 5 %
Lymphs Abs: 0.5 10*3/uL — ABNORMAL LOW (ref 0.7–4.0)
MCH: 27 pg (ref 26.0–34.0)
MCHC: 32.7 g/dL (ref 30.0–36.0)
MCV: 82.6 fL (ref 78.0–100.0)
Monocytes Absolute: 0.1 10*3/uL (ref 0.1–1.0)
Monocytes Relative: 1 %
Neutro Abs: 9.1 10*3/uL — ABNORMAL HIGH (ref 1.7–7.7)
Neutrophils Relative %: 94 %
Platelets: 156 10*3/uL (ref 150–400)
RBC: 3.33 MIL/uL — ABNORMAL LOW (ref 4.22–5.81)
RDW: 15.8 % — ABNORMAL HIGH (ref 11.5–15.5)
WBC: 9.7 10*3/uL (ref 4.0–10.5)

## 2015-11-29 LAB — APTT: aPTT: 30 seconds (ref 24–36)

## 2015-11-29 LAB — BRAIN NATRIURETIC PEPTIDE: B Natriuretic Peptide: 2688.5 pg/mL — ABNORMAL HIGH (ref 0.0–100.0)

## 2015-11-29 LAB — TROPONIN I: Troponin I: 0.08 ng/mL (ref <0.03)

## 2015-11-29 MED ORDER — ATORVASTATIN CALCIUM 80 MG PO TABS
80.0000 mg | ORAL_TABLET | Freq: Every day | ORAL | Status: DC
  Administered 2015-11-29 – 2015-11-30 (×2): 80 mg via ORAL
  Filled 2015-11-29 (×2): qty 1

## 2015-11-29 MED ORDER — INSULIN GLARGINE 100 UNIT/ML ~~LOC~~ SOLN
15.0000 [IU] | Freq: Every day | SUBCUTANEOUS | Status: DC
  Administered 2015-11-29 – 2015-11-30 (×2): 15 [IU] via SUBCUTANEOUS
  Filled 2015-11-29 (×3): qty 0.15

## 2015-11-29 MED ORDER — DEXTROSE 5 % IV SOLN
1.0000 g | INTRAVENOUS | Status: DC
  Administered 2015-11-29 – 2015-12-01 (×3): 1 g via INTRAVENOUS
  Filled 2015-11-29 (×3): qty 10

## 2015-11-29 MED ORDER — FUROSEMIDE 10 MG/ML IJ SOLN
40.0000 mg | Freq: Two times a day (BID) | INTRAMUSCULAR | Status: DC
  Administered 2015-11-29 – 2015-12-01 (×4): 40 mg via INTRAVENOUS
  Filled 2015-11-29 (×4): qty 4

## 2015-11-29 MED ORDER — AZITHROMYCIN 500 MG PO TABS
500.0000 mg | ORAL_TABLET | Freq: Every day | ORAL | Status: DC
  Administered 2015-12-01: 500 mg via ORAL
  Filled 2015-11-29 (×2): qty 1

## 2015-11-29 MED ORDER — FENOFIBRATE 160 MG PO TABS
160.0000 mg | ORAL_TABLET | Freq: Every day | ORAL | Status: DC
  Administered 2015-11-30 – 2015-12-01 (×2): 160 mg via ORAL
  Filled 2015-11-29 (×2): qty 1

## 2015-11-29 MED ORDER — GABAPENTIN 300 MG PO CAPS
300.0000 mg | ORAL_CAPSULE | Freq: Three times a day (TID) | ORAL | Status: DC
  Administered 2015-11-29 – 2015-12-01 (×5): 300 mg via ORAL
  Filled 2015-11-29 (×5): qty 1

## 2015-11-29 MED ORDER — HEPARIN SODIUM (PORCINE) 5000 UNIT/ML IJ SOLN
5000.0000 [IU] | Freq: Three times a day (TID) | INTRAMUSCULAR | Status: DC
  Administered 2015-11-29 (×2): 5000 [IU] via SUBCUTANEOUS
  Filled 2015-11-29 (×3): qty 1

## 2015-11-29 MED ORDER — INSULIN ASPART 100 UNIT/ML ~~LOC~~ SOLN: 0.0000 [IU] | Freq: Three times a day (TID) | SUBCUTANEOUS | Status: DC

## 2015-11-29 MED ORDER — INSULIN ASPART 100 UNIT/ML ~~LOC~~ SOLN
0.0000 [IU] | Freq: Three times a day (TID) | SUBCUTANEOUS | Status: DC
  Administered 2015-11-29 (×2): 7 [IU] via SUBCUTANEOUS
  Administered 2015-11-30: 3 [IU] via SUBCUTANEOUS
  Administered 2015-12-01: 2 [IU] via SUBCUTANEOUS

## 2015-11-29 MED ORDER — SODIUM CHLORIDE 0.9% FLUSH
3.0000 mL | Freq: Two times a day (BID) | INTRAVENOUS | Status: DC
  Administered 2015-11-29 – 2015-11-30 (×3): 3 mL via INTRAVENOUS

## 2015-11-29 MED ORDER — ONDANSETRON HCL 4 MG/2ML IJ SOLN: 4.0000 mg | Freq: Four times a day (QID) | INTRAMUSCULAR | Status: DC | PRN

## 2015-11-29 MED ORDER — SODIUM CHLORIDE 0.9% FLUSH: 3.0000 mL | INTRAVENOUS | Status: DC | PRN

## 2015-11-29 MED ORDER — DEXTROSE 5 % IV SOLN
500.0000 mg | INTRAVENOUS | Status: DC
  Administered 2015-11-29: 500 mg via INTRAVENOUS
  Filled 2015-11-29: qty 500

## 2015-11-29 MED ORDER — ASPIRIN EC 81 MG PO TBEC
81.0000 mg | DELAYED_RELEASE_TABLET | Freq: Every day | ORAL | Status: DC
  Administered 2015-11-29 – 2015-12-01 (×3): 81 mg via ORAL
  Filled 2015-11-29 (×3): qty 1

## 2015-11-29 MED ORDER — HYDROCODONE-ACETAMINOPHEN 5-325 MG PO TABS: 1.0000 | ORAL_TABLET | Freq: Four times a day (QID) | ORAL | Status: DC | PRN

## 2015-11-29 MED ORDER — CARVEDILOL 12.5 MG PO TABS
12.5000 mg | ORAL_TABLET | Freq: Two times a day (BID) | ORAL | Status: DC
  Administered 2015-11-29 – 2015-12-01 (×4): 12.5 mg via ORAL
  Filled 2015-11-29 (×4): qty 1

## 2015-11-29 MED ORDER — SODIUM CHLORIDE 0.9 % IV SOLN: 250.0000 mL | INTRAVENOUS | Status: DC | PRN

## 2015-11-29 MED ORDER — ALPRAZOLAM 0.25 MG PO TABS: 0.2500 mg | ORAL_TABLET | Freq: Two times a day (BID) | ORAL | Status: DC | PRN

## 2015-11-29 MED ORDER — INSULIN GLARGINE 100 UNIT/ML SOLOSTAR PEN: 15.0000 [IU] | PEN_INJECTOR | Freq: Every day | SUBCUTANEOUS | Status: DC

## 2015-11-29 MED ORDER — ACETAMINOPHEN 325 MG PO TABS: 650.0000 mg | ORAL_TABLET | ORAL | Status: DC | PRN

## 2015-11-29 NOTE — Progress Notes (Signed)
CRITICAL VALUE ALERT  Critical value received: Troponin 0.08  Date of notification: 11/29/2015  Time of notification:  9:15  Critical value read back: Yes  Nurse who received alert: Kathryne Sharper, RN

## 2015-11-29 NOTE — Progress Notes (Signed)
CCMD notified that the patient had a 29 beat run of Vtach. Patient is asymptomatic. Physician has been notified. Will continue to monitor.

## 2015-11-29 NOTE — Consult Note (Signed)
Renal Service Consult Note [place] 11/29/2015 Roney Jaffe D Requesting Physician:  Dr Nehemiah Settle  Reason for Consult:  Resp distress/ ESRD HPI: The patient is a 55 y.o. year-old with hx of HTN, DM/ neuro/ nephropathy, ESRD on HD MWF [facility], NICM EF 30%/ AICD, urethral stricture sp repair, TKR '07, HTN on coreg only, presented to [facility] yesterday with SOB. Was in resp distress and was rx'd with BiPAP and transitioned to 4L [place].  Seen at ED [facility] several times recently and was given doxy po for bronchitis on one occasion.  Was transferred to [facility] for admission.  Was admitted and started on IV abx for CAP and IV lasix for vol overload.  Asked to see for ESRD.    Pt notes cough w brownish purulent sputum, no CP or fevers, no abd pain, n/v/d.    Pt started dialysis in June 2017, goes to [place] , [place] on MWF schedule.  Doesn't miss HD. Last 2 HD sessions they were lowering his dry wt due difficulties with DOE.  He was 95 kg, last HD came off at 93.5kg approx.  Missed HD yesterday, on TTS schedule.      ROS  denies CP  no joint pain   no HA  no blurry vision  no rash  no diarrhea  no nausea/ vomiting  no dysuria  no difficulty voiding  no change in urine color    Past Medical History  Past Medical History:  Diagnosis Date  . 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 Allergic rhinitis   . Anemia   . Arthritis   . Chronic systolic heart failure ([place])    a. ECHO (12/2012) EF 25-30%, HK entireanteroseptal myocardium //  b.  EF 25%, diffuse HK, grade 1 diastolic dysfunction, MAC, mild LAE, normal RVSF, trivial pericardial effusion  . CKD (chronic kidney disease)   . COPD (chronic obstructive pulmonary disease) ([place])   . Diabetes mellitus type II   . Diabetic nephropathy ([place])   . Diabetic neuropathy ([place])   . History of cardiac catheterization    a.Myoview 1/15:  There is significant left ventricular  dysfunction. There may be slight scar at the apex. There is no significant ischemia. LV Ejection Fraction: 27%  //  b. RHC/LHC (1/15) with mean RA 6, PA 47/22 mean 33, mean PCWP 20, PVR 2.5 WU, CI 2.5; 80% dLAD stenosis, 70% diffuse large D.   . Hyperlipidemia   . Hypertension   . Kidney stones   . NICM (nonischemic cardiomyopathy) ([place])    Primarily nonischemic. Echo (12/14) with EF 25-30%. Echo (3/15) with EF 25%, mild to moderately dilated LV, normal RV size and systolic function.   . Pneumonia   . Urethral stricture    Past Surgical History  Past Surgical History:  Procedure Laterality Date  . AV FISTULA PLACEMENT Right 09/08/2015   Procedure: INSERTION OF 4-91mm x 45cm  ARTERIOVENOUS (AV) GORE-TEX GRAFT RIGHT UPPER  ARM;  Surgeon: Angelia Mould, MD;  Location: [facility];  Service: Vascular;  Laterality: Right;  . BASCILIC VEIN TRANSPOSITION Right 08/22/2014   Procedure: RIGHT UPPER ARM BASCILIC VEIN TRANSPOSITION;  Surgeon: Angelia Mould, MD;  Location: [facility];  Service: Vascular;  Laterality: Right;  . CARDIAC CATHETERIZATION    . CARDIAC DEFIBRILLATOR PLACEMENT  06/27/2013   Sub Q       BY DR Caryl Comes  . COLONOSCOPY WITH PROPOFOL N/A 07/22/2015   Procedure: COLONOSCOPY WITH PROPOFOL;  Surgeon: Doran Stabler, MD;  Location: [facility];  Service: Gastroenterology;  Laterality: N/A;  . HERNIA REPAIR    . IMPLANTABLE CARDIOVERTER DEFIBRILLATOR IMPLANT N/A 06/27/2013   Procedure: SUB Q ICD;  Surgeon: Deboraha Sprang, MD;  Location: [facility] CATH LAB;  Service: Cardiovascular;  Laterality: N/A;  . LEFT A ND RIGHT HEART CATH  01/30/2013   DR BENSIHMON  . LEFT AND RIGHT HEART CATHETERIZATION WITH CORONARY ANGIOGRAM N/A 01/30/2013   Procedure: LEFT AND RIGHT HEART CATHETERIZATION WITH CORONARY ANGIOGRAM;  Surgeon: Jolaine Artist, MD;  Location: [facility] CATH LAB;  Service: Cardiovascular;  Laterality: N/A;  . PERIPHERAL VASCULAR CATHETERIZATION Right 01/26/2015   Procedure: A/V  Fistulagram;  Surgeon: Angelia Mould, MD;  Location: [facility] CV LAB;  Service: Cardiovascular;  Laterality: Right;  . reapea urethral surgery for recurrent obstruction  2011  . TOTAL KNEE ARTHROPLASTY Right 2007   Family History  Family History  Problem Relation Age of Onset  . Bladder Cancer Mother   . Alcohol abuse Father   . Melanoma Father   . Stroke Maternal Grandmother   . Heart Problems Maternal Grandmother     unknown  . Diabetes Maternal Grandmother   . Heart disease Maternal Grandfather   . Prostate cancer Maternal Grandfather    Social History  reports that he quit smoking about 5 years ago. His smoking use included Cigarettes. He has a 64.00 pack-year smoking history. He has never used smokeless tobacco. He reports that he does not drink alcohol or use drugs. Allergies  Allergies  Allergen Reactions  . Morphine Sulfate Rash and Other (See Comments)    REACTION: Itches all over, red spots   Home medications Prior to Admission medications   Medication Sig Start Date End Date Taking? Authorizing Provider  aspirin EC 81 MG tablet Take 81 mg by mouth daily.    Yes Historical Provider, MD  atorvastatin (LIPITOR) 80 MG tablet Take 1 tablet (80 mg total) by mouth at bedtime. 09/29/14  Yes [name], MD  carvedilol (COREG) 12.5 MG tablet Take 1 tablet (12.5 mg total) by mouth 2 (two) times daily. 09/01/15 11/30/15 Yes Arnoldo Lenis, MD  doxycycline (VIBRAMYCIN) 100 MG capsule Take 100 mg by mouth 2 (two) times daily. For 10 days 11/26/15  Yes Historical Provider, MD  fenofibrate 160 MG tablet Take 1 tablet (160 mg total) by mouth daily. 09/29/14  Yes [name], MD  fexofenadine (ALLEGRA) 180 MG tablet Take 180 mg by mouth daily.   Yes Historical Provider, MD  gabapentin (NEURONTIN) 300 MG capsule Take 1 capsule (300 mg total) by mouth 3 (three) times daily. Patient taking differently: Take 300 mg by mouth 2 (two) times daily.  09/29/14  Yes [name], MD  HYDROcodone-acetaminophen (NORCO/VICODIN) 5-325 MG tablet Take 1 tablet by mouth every 6 (six) hours as needed for moderate pain. 09/08/15  Yes Samantha J Rhyne, PA-C  Insulin Glargine (LANTUS SOLOSTAR) 100 UNIT/ML Solostar Pen Inject 40 Units into the skin daily at 10 pm.    Yes Historical Provider, MD  insulin lispro (HUMALOG) 100 UNIT/ML injection Inject 0-14 Units into the skin 3 (three) times daily as needed for high blood sugar (per sliding scale).   Yes Historical Provider, MD  Olopatadine HCl (PATADAY) 0.2 % SOLN Place 1 drop into both eyes daily as needed (for allergies).    Yes Historical Provider, MD  azelastine (ASTELIN) 0.1 % nasal spray Place 2 sprays into both nostrils  3 (three) times daily as needed for rhinitis. Use in each nostril as directed 03/10/14   [name], MD  BD PEN NEEDLE NANO U/F 32G X 4 MM MISC USE ONCE DAILY Patient not taking: Reported on 11/29/2015 04/03/15   [name], MD  glucose blood test strip Check 1 time daily. E11.9 One Touch Ultra Blue Test Strips Patient not taking: Reported on 11/29/2015 03/10/14   [name], MD   Liver Function Tests  Recent Labs Lab 11/29/15 0810  AST 17  ALT 14*  ALKPHOS 45  BILITOT 0.7  PROT 5.8*  ALBUMIN 2.6*   No results for input(s): LIPASE, AMYLASE in the last 168 hours. CBC  Recent Labs Lab 11/29/15 0810  WBC 9.7  NEUTROABS 9.1*  HGB 9.0*  HCT 27.5*  MCV 82.6  PLT 801   Basic Metabolic Panel  Recent Labs Lab 11/29/15 0810  NA 133*  K 3.9  CL 97*  CO2 24  GLUCOSE 371*  BUN 45*  CREATININE 7.11*  CALCIUM 8.0*   Iron/TIBC/Ferritin/ %Sat    Component Value Date/Time   IRON 55 06/18/2015 1338   TIBC 223 (L) 06/18/2015 1338   FERRITIN 138 06/18/2015 1338   IRONPCTSAT 25 06/18/2015 1338    Vitals:   11/29/15 0650 11/29/15 0808 11/29/15 1229  BP: (!) 180/63 (!) 141/67 (!) 142/65  Pulse: 78 81 76  Resp: 18 18   Temp: 98 F (36.7 C) 98.3 F (36.8 C) 97.8 F  (36.6 C)  TempSrc: Oral Oral Oral  SpO2: 100% 100% 100%  Weight: 93.8 kg (206 lb 14.4 oz)    Height: 5\' 10"  (1.778 m)     Exam Gen alert, WDWN, no distress No rash, cyanosis or gangrene Sclera anicteric, throat clear  No jvd or bruits Chest some rales R base o/w clear RRR no MRG Abd soft ntnd no mass or ascites +bs GU normal male MS no joint effusions or deformity Ext trace pretib edema / no wounds or ulcers Neuro is alert, Ox 3 , nf RUA AVG no bruit or thrill, slight thrill noted in antecub fossa  CXR - R > L infiltrates, R base the most, some vasc congestion   Dialysis: [facility]  - dry wt per pt is 93.5kg approx  Assessment: 1. Resp distress/ dyspnea/ pulm infiltrates - suspect there is component of vol overload. Is at dry wt today, will plan HD with UF 3L as tolerated.  Also getting abx for possible PNA/ bronchitis.  2. ESRD HD started Jun 2017. Has RUA AVG , no strong bruit but he says its has been working fine. Missed HD yesterday. Poss vol overload, as above.  3. COPD, mild it appears 4. HTN on coreg only, BP's good 5. NICM/ EF 30%/ AICD 6. DM2 on insulin    Plan - as above  Kelly Splinter MD [facility] pager [phone]   11/29/2015, 3:06 PM

## 2015-11-29 NOTE — Progress Notes (Signed)
Pharmacy Antibiotic Note  Anthony Bullock is a 55 y.o. male transferred from [facility] on 11/29/2015 with original CC of respiratory distress. Patient with possible community acquired pneumonia.  Pharmacy has been consulted for ceftriaxone dosing. Patient denies fevers at time of admission. Of note he was seen at the ED several times with the same symptoms over the last week and was given doxycycline for possible bronchitis.   Today patient is AF, WBC 9.7. No cultures collected yet.   Plan: Ceftriaxone 1 g q24hrs Monitor Tmax, WBC, clinical picture Follow up LOT, culture data  Height: 5\' 10"  (177.8 cm) Weight: 206 lb 14.4 oz (93.8 kg) IBW/kg (Calculated) : 73  Temp (24hrs), Avg:98.2 F (36.8 C), Min:98 F (36.7 C), Max:98.3 F (36.8 C)  No results for input(s): WBC, CREATININE, LATICACIDVEN, VANCOTROUGH, VANCOPEAK, VANCORANDOM, GENTTROUGH, GENTPEAK, GENTRANDOM, TOBRATROUGH, TOBRAPEAK, TOBRARND, AMIKACINPEAK, AMIKACINTROU, AMIKACIN in the last 168 hours.  CrCl cannot be calculated (Patient's most recent lab result is older than the maximum 21 days allowed.).    Allergies  Allergen Reactions  . Morphine Sulfate Rash    REACTION: Itches all over, red spots    Antimicrobials this admission: CTX 11/19 >>  Dose adjustments this admission: n/a  Microbiology results: None sent yet  Thank you for allowing pharmacy to be a part of this patient's care.  Carlean Jews, Pharm.D. PGY1 Pharmacy Resident 11/19/20178:27 AM Pager [phone]

## 2015-11-29 NOTE — Progress Notes (Signed)
Patient arrived to unit at 2116.  RUAVG negative for bruit, ever so slight thrill noted around distal area.  Upon accessing, no blood return noted.  Access attempted x4 with both 15 and 16 gauge needles.  Although access to graft was achieved, no blood return was noted.  Patient states to this RN that he is a "bleeder."  Upon removal of all four needles, very slight bleeding noted, with blood a dark red colour.  Nephrology notified.  Returning patient to room for time being.  Will continue to monitor.

## 2015-11-29 NOTE — H&P (Signed)
History and Physical    PANAGIOTIS OELKERS RSW:[id number] DOB: 1960-01-27 DOA: 11/29/2015   PCP: [name], MD   Patient coming from:  Home    Chief Complaint: Shortmess of Breath   HPI: Anthony Bullock is a 55 y.o. male extensive medical history including CHF, ESRD on HD MWF, last HD on 11/17 on transplant list , DM, CAD, s/p AICD,  transferred from [facility] for continuation of care. He presented with respiratory distress requiring Bipap for O2 57% and now on 4 L O2. Patient had been seen at the ED several times prior with same symptoms over the last week, treated at the ED, given doxycycline for possible bronchitis and sent home.  At [facility], he received Albuterol x3, Solumedrol 125 mg and other respiratory management with improvement of symptoms. He reports brown sputum is his cough, for about 1 week.  He was stable at the time of transfer. Denies fevers, chills, night sweats, vision changes, or mucositis. Denies any chest pain or palpitations. Denies lower extremity swelling. Denies nausea, heartburn or change in bowel habits. Denies abdominal pain. Appetite is normal. Makes little amount of urine  Denies abnormal skin rashes, has chronic neuropathy. Denies any bleeding issues such as epistaxis, hematemesis, hematuria or hematochezia.    ED Course:  BP (!) 141/67 (BP Location: Left Leg)   Pulse 81   Temp 98.3 F (36.8 C) (Oral)   Resp 18   Ht 5\' 10"  (1.778 m)   Wt 93.8 kg (206 lb 14.4 oz)   SpO2 100%   BMI 29.69 kg/m    At [facility] the pertinent labs were as follows   Bicarbonate 30.6 sodium 134 potassium 4.2 glucose 326 calcium 8.3 terbutaline 0.4 AST 18.5 ALT 11 alkaline phosphatase 71 troponin 0.24 PT 10.3 INR 1.0 d-dimer 1.87  white count 16.1 hemoglobin 11.1 platelets 212 Tn 0.22 At the ED there,  he received Lasix 40 mg 1 yesterday,  Piperacillin and tazobactam, IV fluids on 11/17  CXR worsening made and lower lung zone airspace opacification, may be due to edema,  but pneumonia is not excluded.  All the pertinent labs ans radiography is being ordered at this facility.   Review of Systems: As per HPI otherwise 10 point review of systems negative.   Past Medical History:  Diagnosis Date  . 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 Allergic rhinitis   . Anemia   . Arthritis   . Chronic systolic heart failure ([place])    a. ECHO (12/2012) EF 25-30%, HK entireanteroseptal myocardium //  b.  EF 25%, diffuse HK, grade 1 diastolic dysfunction, MAC, mild LAE, normal RVSF, trivial pericardial effusion  . CKD (chronic kidney disease)   . COPD (chronic obstructive pulmonary disease) ([place])   . Diabetes mellitus type II   . Diabetic nephropathy ([place])   . Diabetic neuropathy ([place])   . Diabetic neuropathy ([place])   . History of cardiac catheterization    a.Myoview 1/15:  There is significant left ventricular dysfunction. There may be slight scar at the apex. There is no significant ischemia. LV Ejection Fraction: 27%  //  b. RHC/LHC (1/15) with mean RA 6, PA 47/22 mean 33, mean PCWP 20, PVR 2.5 WU, CI 2.5; 80% dLAD stenosis, 70% diffuse large D.   . Hyperlipidemia   . Hypertension   . Kidney stones   . NICM (nonischemic cardiomyopathy) ([place])    Primarily nonischemic. Echo (12/14) with EF 25-30%. Echo (3/15) with EF 25%, mild  to moderately dilated LV, normal RV size and systolic function.   . Pneumonia   . Urethral stricture     Past Surgical History:  Procedure Laterality Date  . AV FISTULA PLACEMENT Right 09/08/2015   Procedure: INSERTION OF 4-78mm x 45cm  ARTERIOVENOUS (AV) GORE-TEX GRAFT RIGHT UPPER  ARM;  Surgeon: Angelia Mould, MD;  Location: [facility];  Service: Vascular;  Laterality: Right;  . BASCILIC VEIN TRANSPOSITION Right 08/22/2014   Procedure: RIGHT UPPER ARM BASCILIC VEIN TRANSPOSITION;  Surgeon: Angelia Mould, MD;  Location: [place];  Service: Vascular;  Laterality: Right;  . CARDIAC CATHETERIZATION     . CARDIAC DEFIBRILLATOR PLACEMENT  06/27/2013   Sub Q       BY DR Caryl Bullock  . COLONOSCOPY WITH PROPOFOL N/A 07/22/2015   Procedure: COLONOSCOPY WITH PROPOFOL;  Surgeon: Doran Stabler, MD;  Location: [facility];  Service: Gastroenterology;  Laterality: N/A;  . HERNIA REPAIR    . IMPLANTABLE CARDIOVERTER DEFIBRILLATOR IMPLANT N/A 06/27/2013   Procedure: SUB Q ICD;  Surgeon: Deboraha Sprang, MD;  Location: [facility] CATH LAB;  Service: Cardiovascular;  Laterality: N/A;  . LEFT A ND RIGHT HEART CATH  01/30/2013   DR BENSIHMON  . LEFT AND RIGHT HEART CATHETERIZATION WITH CORONARY ANGIOGRAM N/A 01/30/2013   Procedure: LEFT AND RIGHT HEART CATHETERIZATION WITH CORONARY ANGIOGRAM;  Surgeon: Jolaine Artist, MD;  Location: [facility] CATH LAB;  Service: Cardiovascular;  Laterality: N/A;  . PERIPHERAL VASCULAR CATHETERIZATION Right 01/26/2015   Procedure: A/V Fistulagram;  Surgeon: Angelia Mould, MD;  Location: [place] CV LAB;  Service: Cardiovascular;  Laterality: Right;  . reapea urethral surgery for recurrent obstruction  2011  . TOTAL KNEE ARTHROPLASTY Right 2007    Social History Social History   Social History  . Marital status: Married    Spouse name: N/A  . Number of children: 0  . Years of education: N/A   Occupational History  . Not on file.   Social History Main Topics  . Smoking status: Former Smoker    Packs/day: 2.00    Years: 32.00    Types: Cigarettes    Quit date: 05/11/2010  . Smokeless tobacco: Never Used  . Alcohol use No  . Drug use: No  . Sexual activity: Not on file   Other Topics Concern  . 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 Morphine Sulfate Rash    REACTION: Itches all over, red spots    Family History  Problem Relation Age of Onset  . Bladder Cancer Mother   . Alcohol abuse Father   . Melanoma Father   . Stroke Maternal Grandmother   . Heart Problems Maternal  Grandmother     unknown  . Diabetes Maternal Grandmother   . Heart disease Maternal Grandfather   . Prostate cancer Maternal Grandfather       Prior to Admission medications   Medication Sig Start Date End Date Taking? Authorizing Provider  aspirin EC 81 MG tablet Take 81 mg by mouth daily.     Historical Provider, MD  atorvastatin (LIPITOR) 80 MG tablet Take 1 tablet (80 mg total) by mouth at bedtime. 09/29/14   [name], MD  azelastine (ASTELIN) 0.1 % nasal spray Place 2 sprays into both nostrils 3 (three) times daily as needed for rhinitis. Use in each nostril as directed 03/10/14  [name], MD  BD PEN NEEDLE NANO U/F 32G X 4 MM MISC USE ONCE DAILY 04/03/15   [name], MD  carvedilol (COREG) 12.5 MG tablet Take 1 tablet (12.5 mg total) by mouth 2 (two) times daily. 09/01/15 11/30/15  Arnoldo Lenis, MD  fenofibrate 160 MG tablet Take 1 tablet (160 mg total) by mouth daily. 09/29/14   [name], MD  fexofenadine (ALLEGRA) 180 MG tablet Take 180 mg by mouth daily.    Historical Provider, MD  gabapentin (NEURONTIN) 300 MG capsule Take 1 capsule (300 mg total) by mouth 3 (three) times daily. 09/29/14   [name], MD  glucose blood test strip Check 1 time daily. E11.9 One Touch Ultra Blue Test Strips 03/10/14   [name], MD  HYDROcodone-acetaminophen (NORCO/VICODIN) 5-325 MG tablet Take 1 tablet by mouth every 6 (six) hours as needed for moderate pain. 09/08/15   Samantha J Rhyne, PA-C  Insulin Glargine (LANTUS SOLOSTAR) 100 UNIT/ML Solostar Pen Inject 30 Units into the skin daily at 10 pm.     Historical Provider, MD  insulin lispro (HUMALOG) 100 UNIT/ML injection Inject 0-14 Units into the skin 3 (three) times daily as needed for high blood sugar (per sliding scale).    Historical Provider, MD  Olopatadine HCl (PATADAY) 0.2 % SOLN Place 1 drop into both eyes daily as needed (for allergies).     Historical Provider, MD    Physical  Exam:    Vitals:   11/29/15 0650 11/29/15 0808  BP: (!) 180/63 (!) 141/67  Pulse: 78 81  Resp: 18 18  Temp: 98 F (36.7 C) 98.3 F (36.8 C)  TempSrc: Oral Oral  SpO2: 100% 100%  Weight: 93.8 kg (206 lb 14.4 oz)   Height: 5\' 10"  (1.778 m)        Constitutional: NAD, calm, comfortable  Vitals:   11/29/15 0650 11/29/15 0808  BP: (!) 180/63 (!) 141/67  Pulse: 78 81  Resp: 18 18  Temp: 98 F (36.7 C) 98.3 F (36.8 C)  TempSrc: Oral Oral  SpO2: 100% 100%  Weight: 93.8 kg (206 lb 14.4 oz)   Height: 5\' 10"  (1.778 m)    Eyes: PERRL, lids and conjunctivae normal ENMT: Mucous membranes are moist. Posterior pharynx clear of any exudate or lesions.Normal dentition.  Neck: normal, supple, no masses, no thyromegaly Respiratory:   no wheezing, no crackles R>L coarse rhonchi . Normal respiratory effort. No accessory muscle use.  Cardiovascular: Regular rate and rhythm, soft 1/6  murmurs / rubs / gallops. No extremity edema. 2+ pedal pulses. No carotid bruits.  Abdomen: no tenderness, no masses palpated. No hepatosplenomegaly. Bowel sounds positive.  Musculoskeletal: no clubbing / cyanosis. No joint deformity upper and lower extremities. Good ROM, no contractures. Normal muscle tone.  Skin: no rashes, lesions, ulcers.  Neurologic: CN 2-12 grossly intact. Sensation intact, DTR normal. Strength 5/5 in all 4.  Psychiatric: Normal judgment and insight. Alert and oriented x 3. Normal mood.     Labs on Admission: I have personally reviewed following labs and imaging studies  CBC:  Recent Labs Lab 11/29/15 0810  WBC 9.7  NEUTROABS 9.1*  HGB 9.0*  HCT 27.5*  MCV 82.6  PLT 829    Basic Metabolic Panel: No results for input(s): NA, K, CL, CO2, GLUCOSE, BUN, CREATININE, CALCIUM, MG, PHOS in the last 168 hours.  GFR: CrCl cannot be calculated (Patient's most recent lab result is older than the maximum 21 days allowed.).  Liver Function Tests: No results for input(s): AST, ALT,  ALKPHOS, BILITOT, PROT, ALBUMIN in the last 168 hours. No results for input(s): LIPASE, AMYLASE in the last 168 hours. No results for input(s): AMMONIA in the last 168 hours.  Coagulation Profile:  Recent Labs Lab 11/29/15 0810  INR 1.15    Cardiac Enzymes: No results for input(s): CKTOTAL, CKMB, CKMBINDEX, TROPONINI in the last 168 hours.  BNP (last 3 results) No results for input(s): PROBNP in the last 8760 hours.  HbA1C: No results for input(s): HGBA1C in the last 72 hours.  CBG:  Recent Labs Lab 11/29/15 0804  GLUCAP 340*    Lipid Profile: No results for input(s): CHOL, HDL, LDLCALC, TRIG, CHOLHDL, LDLDIRECT in the last 72 hours.  Thyroid Function Tests: No results for input(s): TSH, T4TOTAL, FREET4, T3FREE, THYROIDAB in the last 72 hours.  Anemia Panel: No results for input(s): VITAMINB12, FOLATE, FERRITIN, TIBC, IRON, RETICCTPCT in the last 72 hours.  Urine analysis:    Component Value Date/Time   COLORURINE yellow 09/28/2009 1317   APPEARANCEUR Clear 09/28/2009 1317   LABSPEC 1.010 09/28/2009 1317   PHURINE 5.0 09/28/2009 1317   HGBUR 2+ 09/28/2009 1317   BILIRUBINUR neg 12/26/2011 1604   PROTEINUR +++ 12/26/2011 1604   UROBILINOGEN 0.2 12/26/2011 1604   UROBILINOGEN 0.2 09/28/2009 1317   NITRITE neg 12/26/2011 1604   NITRITE negative 09/28/2009 1317   LEUKOCYTESUR Negative 12/26/2011 1604    Sepsis Labs: @LABRCNTIP (procalcitonin:4,lacticidven:4) )No results found for this or any previous visit (from the past 240 hour(s)).   Radiological Exams on Admission: No results found.  EKG: Independently reviewed.  Assessment/Plan Active Problems:   Acute respiratory failure with hypoxia (HCC)   CHF exacerbation (HCC)   Poorly controlled type II diabetes mellitus with renal complication (HCC)   Hyperlipidemia   Adjustment disorder with depressed mood   Essential hypertension   Anemia   Chronic kidney disease (CKD), stage IV (severe) (HCC)    Diabetic peripheral neuropathy (HCC)   COPD (chronic obstructive pulmonary disease) (HCC)   Chronic systolic congestive heart failure (HCC)   CAD (coronary artery disease)   NICM (nonischemic cardiomyopathy) (HCC)   Osteoarthritis of multiple joints   At high risk for falls   Peripheral vascular disease (HCC)    Acute hypoxic respiratory failure likely secondary to acute on chronic systolic CHF exacerbation and possible underlying  Pneumonia per CXR performed at [facility]. WBC 16 . Afebrile  Admit to  Telemetry Obs  CHForder set Troponin  BNP  Daily weights and strict I/O  2 D ECHO  Continue Meds  Lasix  40 mg IV BID  Nursing staff to provide education about CHF Will repeat CXR on admission, will cover with Azitrhomycin and Ceftriaxone.  Sputum Cultures    CAD/ ICM. PAtient s/p ICD   EKG SR without ACS  Troponin 1.22 in the setting of CHF exacerbation . Last cath 01/2013 . patient is cardiac pain free at this time. Continue home meds    Hypertension BP  180/63   Pulse 78  Continue home anti-hypertensive medications    Hyperlipidemia Continue home statins    Type II Diabetes with neuropathy Current blood sugar level at [facility] was in the 300s  Lab Results  Component Value Date   HGBA1C 8.8 04/29/2015  Hgb A1C Lantus , SSI Heart healthy carb modified diet. Continue Neurontin   Chronic kidney disease stage 4 on HD MWF   Current Cr 4.11  Lab Results  Component Value Date  CREATININE 4.11 (H) 07/22/2015   CREATININE 8.01 (H) 06/18/2015   CREATININE 8.30 (H) 05/21/2015   Repeat CMET in am Will inform Nephrology of patient's admission poss HD     DVT prophylaxis:  Heparin   Code Status:  Full  Family Communication:  Discussed with patient Disposition Plan: Expect patient to be discharged to home after condition improves Consults called:    Nephrology  Admission status:Tele  Obs     [facility] E, PA-C [facility]   11/29/2015, 8:48 AM

## 2015-11-29 NOTE — Progress Notes (Signed)
Called by HD nurse, AVG R upper arm is clotted.  No thrill or bruit, needles returning dark blood only in small amounts.  K stable and no resp complaints. Plan is postpone HD for now, will consult IR to see for clotted AVG in am.    Kelly Splinter MD [facility] pgr [phone]   11/29/2015, 11:30 PM

## 2015-11-29 NOTE — Progress Notes (Signed)
Per nephrology, patient to IR in AM.

## 2015-11-30 ENCOUNTER — Inpatient Hospital Stay (HOSPITAL_COMMUNITY): Payer: BLUE CROSS/BLUE SHIELD

## 2015-11-30 ENCOUNTER — Encounter (HOSPITAL_COMMUNITY): Payer: Self-pay | Admitting: General Practice

## 2015-11-30 ENCOUNTER — Other Ambulatory Visit (HOSPITAL_COMMUNITY): Payer: BLUE CROSS/BLUE SHIELD

## 2015-11-30 DIAGNOSIS — J9601 Acute respiratory failure with hypoxia: Secondary | ICD-10-CM

## 2015-11-30 DIAGNOSIS — I428 Other cardiomyopathies: Secondary | ICD-10-CM

## 2015-11-30 DIAGNOSIS — I509 Heart failure, unspecified: Secondary | ICD-10-CM

## 2015-11-30 DIAGNOSIS — I2510 Atherosclerotic heart disease of native coronary artery without angina pectoris: Secondary | ICD-10-CM

## 2015-11-30 DIAGNOSIS — N186 End stage renal disease: Secondary | ICD-10-CM

## 2015-11-30 DIAGNOSIS — E1129 Type 2 diabetes mellitus with other diabetic kidney complication: Secondary | ICD-10-CM

## 2015-11-30 DIAGNOSIS — I739 Peripheral vascular disease, unspecified: Secondary | ICD-10-CM

## 2015-11-30 DIAGNOSIS — E785 Hyperlipidemia, unspecified: Secondary | ICD-10-CM

## 2015-11-30 DIAGNOSIS — E1142 Type 2 diabetes mellitus with diabetic polyneuropathy: Secondary | ICD-10-CM

## 2015-11-30 DIAGNOSIS — E1165 Type 2 diabetes mellitus with hyperglycemia: Secondary | ICD-10-CM

## 2015-11-30 DIAGNOSIS — Z992 Dependence on renal dialysis: Secondary | ICD-10-CM

## 2015-11-30 HISTORY — PX: IR GENERIC HISTORICAL: IMG1180011

## 2015-11-30 LAB — HEMOGLOBIN A1C
Hgb A1c MFr Bld: 8.1 % — ABNORMAL HIGH (ref 4.8–5.6)
Mean Plasma Glucose: 186 mg/dL

## 2015-11-30 LAB — GLUCOSE, CAPILLARY
Glucose-Capillary: 242 mg/dL — ABNORMAL HIGH (ref 65–99)
Glucose-Capillary: 96 mg/dL (ref 65–99)
Glucose-Capillary: 152 mg/dL — ABNORMAL HIGH (ref 65–99)

## 2015-11-30 LAB — BASIC METABOLIC PANEL
Anion gap: 11 (ref 5–15)
BUN: 62 mg/dL — ABNORMAL HIGH (ref 6–20)
CO2: 25 mmol/L (ref 22–32)
Calcium: 7.7 mg/dL — ABNORMAL LOW (ref 8.9–10.3)
Chloride: 98 mmol/L — ABNORMAL LOW (ref 101–111)
Creatinine, Ser: 7.69 mg/dL — ABNORMAL HIGH (ref 0.61–1.24)
GFR calc Af Amer: 8 mL/min — ABNORMAL LOW (ref >60)
GFR calc non Af Amer: 7 mL/min — ABNORMAL LOW (ref >60)
Glucose, Bld: 251 mg/dL — ABNORMAL HIGH (ref 65–99)
Potassium: 3.7 mmol/L (ref 3.5–5.1)
Sodium: 134 mmol/L — ABNORMAL LOW (ref 135–145)

## 2015-11-30 LAB — HEPATITIS B SURFACE ANTIGEN: Hepatitis B Surface Ag: NEGATIVE

## 2015-11-30 MED ORDER — HEPARIN SODIUM (PORCINE) 1000 UNIT/ML DIALYSIS: 2000.0000 [IU] | INTRAMUSCULAR | Status: DC | PRN

## 2015-11-30 MED ORDER — SODIUM CHLORIDE 0.9 % IV SOLN: 100.0000 mL | INTRAVENOUS | Status: DC | PRN

## 2015-11-30 MED ORDER — LIDOCAINE HCL 1 % IJ SOLN
INTRAMUSCULAR | Status: AC
  Administered 2015-11-30: 6 mL
  Filled 2015-11-30: qty 20

## 2015-11-30 MED ORDER — PENTAFLUOROPROP-TETRAFLUOROETH EX AERO: 1.0000 "application " | INHALATION_SPRAY | CUTANEOUS | Status: DC | PRN

## 2015-11-30 MED ORDER — ALTEPLASE 2 MG IJ SOLR
INTRAMUSCULAR | Status: AC
  Filled 2015-11-30: qty 2

## 2015-11-30 MED ORDER — HEPARIN SODIUM (PORCINE) 1000 UNIT/ML DIALYSIS: 1000.0000 [IU] | INTRAMUSCULAR | Status: DC | PRN

## 2015-11-30 MED ORDER — ALTEPLASE 2 MG IJ SOLR
INTRAMUSCULAR | Status: AC | PRN
  Administered 2015-11-30: 2 mg

## 2015-11-30 MED ORDER — IOPAMIDOL (ISOVUE-300) INJECTION 61%
INTRAVENOUS | Status: AC
  Administered 2015-11-30: 50 mL
  Filled 2015-11-30: qty 100

## 2015-11-30 MED ORDER — HEPARIN SODIUM (PORCINE) 1000 UNIT/ML IJ SOLN
INTRAMUSCULAR | Status: AC
  Filled 2015-11-30: qty 1

## 2015-11-30 MED ORDER — MIDAZOLAM HCL 2 MG/2ML IJ SOLN
INTRAMUSCULAR | Status: AC
  Filled 2015-11-30: qty 4

## 2015-11-30 MED ORDER — HEPARIN SODIUM (PORCINE) 1000 UNIT/ML IJ SOLN
INTRAMUSCULAR | Status: AC | PRN
  Administered 2015-11-30: 3000 [IU] via INTRAVENOUS

## 2015-11-30 MED ORDER — FENTANYL CITRATE (PF) 100 MCG/2ML IJ SOLN
INTRAMUSCULAR | Status: AC | PRN
  Administered 2015-11-30 (×2): 50 ug via INTRAVENOUS

## 2015-11-30 MED ORDER — HEPARIN SODIUM (PORCINE) 5000 UNIT/ML IJ SOLN
5000.0000 [IU] | Freq: Three times a day (TID) | INTRAMUSCULAR | Status: DC
  Administered 2015-12-01: 5000 [IU] via SUBCUTANEOUS
  Filled 2015-11-30: qty 1

## 2015-11-30 MED ORDER — LIDOCAINE HCL (PF) 1 % IJ SOLN: 5.0000 mL | INTRAMUSCULAR | Status: DC | PRN

## 2015-11-30 MED ORDER — LIDOCAINE-PRILOCAINE 2.5-2.5 % EX CREA: 1.0000 "application " | TOPICAL_CREAM | CUTANEOUS | Status: DC | PRN

## 2015-11-30 MED ORDER — FENTANYL CITRATE (PF) 100 MCG/2ML IJ SOLN
INTRAMUSCULAR | Status: AC
  Filled 2015-11-30: qty 4

## 2015-11-30 MED ORDER — ALTEPLASE 2 MG IJ SOLR
2.0000 mg | Freq: Once | INTRAMUSCULAR | Status: DC | PRN
Start: 2015-11-30 — End: 2015-11-30

## 2015-11-30 MED ORDER — MIDAZOLAM HCL 2 MG/2ML IJ SOLN
INTRAMUSCULAR | Status: AC | PRN
  Administered 2015-11-30 (×2): 1 mg via INTRAVENOUS

## 2015-11-30 NOTE — Progress Notes (Signed)
PT Cancellation Note  Patient Details Name: Anthony Bullock MRN: [id number] DOB: 1960-07-04   Cancelled Treatment:    Reason Eval/Treat Not Completed: Patient at procedure or test/unavailable. Pt off floor at IR for removal of clot in dialysis catheter. PT to return as able to complete evaluation.    Alixandrea Milleson M Anahit Klumb 11/30/2015, 11:17 AM   [name], PT, DPT Pager #: [phone] Office #: [phone]

## 2015-11-30 NOTE — Sedation Documentation (Signed)
Patient is resting comfortably. No complaints from pt at this time.  

## 2015-11-30 NOTE — Sedation Documentation (Signed)
Pt is resting, no complaints at this time, vitals stable

## 2015-11-30 NOTE — Progress Notes (Signed)
PT Cancellation Note  Patient Details Name: Anthony Bullock MRN: [id number] DOB: August 29, 1960   Cancelled Treatment:    Reason Eval/Treat Not Completed: Patient at procedure or test/unavailable. Pt now in HD. PT to return as able.   [name] [name] 11/30/2015, 1:39 PM   [name], PT, DPT Pager #: [phone] Office #: [phone]

## 2015-11-30 NOTE — Sedation Documentation (Signed)
Vital signs stable. 

## 2015-11-30 NOTE — Sedation Documentation (Signed)
Patient is resting comfortably. Vital signs stable. Pt tolerating procedure very well.

## 2015-11-30 NOTE — Sedation Documentation (Signed)
Patient is resting comfortably. 

## 2015-11-30 NOTE — Sedation Documentation (Signed)
Pt states that his right arm is hurting

## 2015-11-30 NOTE — Procedures (Signed)
Interventional Radiology Procedure Note  Procedure:  Right arm AVGG declot with angioplasty  Complications: None  Estimated Blood Loss: 10-20 mL  Findings:  Occluded right upper arm straight graft due to stenosis at venous anastamosis.  Graft opened after Angiojet thrombectomy, 7 mm angioplasty of venous anastamosis and Fogarty thrombectomy across arterial anastamosis.  Widely patent with no significant residual anastamotic stenosis on completion.  [name]. [name], M.D Pager:  [phone]

## 2015-11-30 NOTE — Progress Notes (Signed)
Assessment: 1. Resp distress/ dyspnea/ pulm infiltrates - suspect there is mainly a component of vol overload. Is at dry wt today, will plan HD with UF 4L as tolerated.  Also getting abx for possible PNA/ bronchitis.  2. ESRD HD started Jun 2017. Has RUA AVG , clotted for IR intervention today 3. COPD, mild it appears 4. HTN on coreg only, BP's good 5. NICM/ EF 30%/ AICD 6. DM2 on insulin  Plan - after access intervention will need HD and much lower EDW.  Subjective: Interval History: clotted AVGG  Objective: Vital signs in last 24 hours: Temp:  [97.7 F (36.5 C)-97.9 F (36.6 C)] 97.7 F (36.5 C) (11/20 0405) Pulse Rate:  [65-76] 73 (11/20 0405) Resp:  [20] 20 (11/20 0405) BP: (142-157)/(57-67) 145/60 (11/20 0405) SpO2:  [100 %] 100 % (11/20 0405) Weight:  [94.8 kg (209 lb 1.6 oz)] 94.8 kg (209 lb 1.6 oz) (11/20 0405) Weight change: 0.998 kg (2 lb 3.2 oz)  Intake/Output from previous day: 11/19 0701 - 11/20 0700 In: 50 [IV Piggyback:50] Out: 495 [Urine:495] Intake/Output this shift: No intake/output data recorded.  General appearance: alert and cooperative Resp: rhonchi bibasilar  Chest wall: no tenderness Extremities: edema 2+   Lab Results:  Recent Labs  11/29/15 0810  WBC 9.7  HGB 9.0*  HCT 27.5*  PLT 156   BMET:  Recent Labs  11/29/15 0810 11/30/15 0158  NA 133* 134*  K 3.9 3.7  CL 97* 98*  CO2 24 25  GLUCOSE 371* 251*  BUN 45* 62*  CREATININE 7.11* 7.69*  CALCIUM 8.0* 7.7*   No results for input(s): PTH in the last 72 hours. Iron Studies: No results for input(s): IRON, TIBC, TRANSFERRIN, FERRITIN in the last 72 hours. Studies/Results: Dg Chest 2 View  Result Date: 11/30/2015 CLINICAL DATA:  2-3 days of productive cough and shortness of breath. EXAM: CHEST  2 VIEW COMPARISON:  Portable chest x-ray of November 28, 2015 FINDINGS: The lungs are adequately inflated. The interstitial markings are coarse but have improved significantly since the  study of 2 days ago. Heart remains top-normal in size. The pulmonary vascularity is not engorged today. The ICD is in stable position. The mediastinum is normal in width. There is a a trace of pleural fluid blunting the costophrenic angles on the left. The observed bony thorax exhibits no acute abnormality. IMPRESSION: Marked improvement in pulmonary edema since the previous study. Persistent coarse infrahilar lung markings greatest on the right likely reflect atelectasis. Trace left pleural effusion. Electronically Signed   By: David  Martinique M.D.   On: 11/30/2015 07:44   Scheduled: . aspirin EC  81 mg Oral Daily  . atorvastatin  80 mg Oral QHS  . azithromycin  500 mg Oral Daily  . carvedilol  12.5 mg Oral BID  . cefTRIAXone (ROCEPHIN)  IV  1 g Intravenous Q24H  . fenofibrate  160 mg Oral Daily  . furosemide  40 mg Intravenous BID  . gabapentin  300 mg Oral TID  . [START ON 12/01/2015] heparin  5,000 Units Subcutaneous Q8H  . insulin aspart  0-9 Units Subcutaneous TID WC  . insulin glargine  15 Units Subcutaneous QHS  . sodium chloride flush  3 mL Intravenous Q12H     LOS: 1 day   Jacquline Terrill C 11/30/2015,8:08 AM

## 2015-11-30 NOTE — Sedation Documentation (Signed)
Vital signs stable. Pt is resting

## 2015-11-30 NOTE — Progress Notes (Signed)
OT Cancellation Note  Patient Details Name: Anthony Bullock MRN: [id number] DOB: April 15, 1960   Cancelled Treatment:    Reason Eval/Treat Not Completed: Patient at procedure or test/ unavailable. Pt in hemodialysis.  Almon Register [id number] 11/30/2015, 1:16 PM

## 2015-11-30 NOTE — Sedation Documentation (Signed)
Pt is sleeping at this time. Pt responds to voice when spoken to

## 2015-11-30 NOTE — Consult Note (Signed)
Chief Complaint: Patient was seen in consultation today for right upper arm dialysis graft thrombolysis at the request of Dr Roney Jaffe  Referring Physician(s): Dr Roney Jaffe  Supervising Physician: Aletta Edouard  Patient Status: [facility] - In-pt  History of Present Illness: Anthony Bullock is a 55 y.o. male   ESRD RUA dialysis graft placed by Dr Scot Dock 09/07/15 Matured and in use since end of Sept . Last use was Fri 11/17---without issue Pt developed shortness of breath and cough over weekend and presented to [facility] Sunday pm Was sent to [facility] for dialysis--fluid overload. Was found to be pulling clots and unable to dialyze successfully. Request for thrombolysis with possible angioplasty/stent placement. Possible dialysis catheter placement If needed per Dr Melvia Heaps  No Hx recent CVA; surgery or hematuria  Past Medical History:  Diagnosis Date  . 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 Allergic rhinitis   . Anemia   . Arthritis   . Chronic systolic heart failure ([place])    a. ECHO (12/2012) EF 25-30%, HK entireanteroseptal myocardium //  b.  EF 25%, diffuse HK, grade 1 diastolic dysfunction, MAC, mild LAE, normal RVSF, trivial pericardial effusion  . COPD (chronic obstructive pulmonary disease) ([place])   . Diabetes mellitus type II   . Diabetic nephropathy ([place])   . Diabetic neuropathy ([place])   . ESRD on hemodialysis [facility])    started HD June 2017, goes to [facility] HD unit, Dr Hinda Lenis  . History of cardiac catheterization    a.Myoview 1/15:  There is significant left ventricular dysfunction. There may be slight scar at the apex. There is no significant ischemia. LV Ejection Fraction: 27%  //  b. RHC/LHC (1/15) with mean RA 6, PA 47/22 mean 33, mean PCWP 20, PVR 2.5 WU, CI 2.5; 80% dLAD stenosis, 70% diffuse large D.   . Hyperlipidemia   . Hypertension   . Kidney stones   . NICM (nonischemic cardiomyopathy) ([place])    Primarily nonischemic. Echo (12/14) with EF 25-30%. Echo (3/15) with EF 25%, mild to moderately dilated LV, normal RV size and systolic function.   . Pneumonia   . Urethral stricture     Past Surgical History:  Procedure Laterality Date  . AV FISTULA PLACEMENT Right 09/08/2015   Procedure: INSERTION OF 4-4mm x 45cm  ARTERIOVENOUS (AV) GORE-TEX GRAFT RIGHT UPPER  ARM;  Surgeon: Angelia Mould, MD;  Location: [place];  Service: Vascular;  Laterality: Right;  . BASCILIC VEIN TRANSPOSITION Right 08/22/2014   Procedure: RIGHT UPPER ARM BASCILIC VEIN TRANSPOSITION;  Surgeon: Angelia Mould, MD;  Location: [place];  Service: Vascular;  Laterality: Right;  . CARDIAC CATHETERIZATION    . CARDIAC DEFIBRILLATOR PLACEMENT  06/27/2013   Sub Q       BY DR Caryl Comes  . COLONOSCOPY WITH PROPOFOL N/A 07/22/2015   Procedure: COLONOSCOPY WITH PROPOFOL;  Surgeon: Doran Stabler, MD;  Location: [facility] ENDOSCOPY;  Service: Gastroenterology;  Laterality: N/A;  . HERNIA REPAIR    . IMPLANTABLE CARDIOVERTER DEFIBRILLATOR IMPLANT N/A 06/27/2013   Procedure: SUB Q ICD;  Surgeon: Deboraha Sprang, MD;  Location[facility] C[facility] LAB;  Service: Cardiovascular;  Laterality: N/A;  . LEFT A ND RIGHT HEART CATH  01/30/2013   DR BENSIHMON  . LEFT AND RIGHT HEART CATHETERIZATION WITH CORONARY ANGIOGRAM N/A 01/30/2013   Procedure: LEFT AND RIGHT HEART CATHETERIZATION WITH CORONARY ANGIOGRAM;  Surgeon: Jolaine Artist, MD;  Location: [facility] CATH LAB;  Service: Cardiovascular;  Laterality: N/A;  . PERIPHERAL VASCULAR CATHETERIZATION Right 01/26/2015   Procedure: A/V Fistulagram;  Surgeon: Angelia Mould, MD;  Location: [place] CV LAB;  Service: Cardiovascular;  Laterality: Right;  . reapea urethral surgery for recurrent obstruction  2011  . TOTAL KNEE ARTHROPLASTY Right 2007    Allergies: Morphine sulfate  Medications: Prior to Admission medications   Medication Sig Start Date End Date Taking? Authorizing Provider    aspirin EC 81 MG tablet Take 81 mg by mouth daily.    Yes Historical Provider, MD  atorvastatin (LIPITOR) 80 MG tablet Take 1 tablet (80 mg total) by mouth at bedtime. 09/29/14  Yes [name], MD  carvedilol (COREG) 12.5 MG tablet Take 1 tablet (12.5 mg total) by mouth 2 (two) times daily. 09/01/15 11/30/15 Yes Arnoldo Lenis, MD  doxycycline (VIBRAMYCIN) 100 MG capsule Take 100 mg by mouth 2 (two) times daily. For 10 days 11/26/15  Yes Historical Provider, MD  fenofibrate 160 MG tablet Take 1 tablet (160 mg total) by mouth daily. 09/29/14  Yes [name], MD  fexofenadine (ALLEGRA) 180 MG tablet Take 180 mg by mouth daily.   Yes Historical Provider, MD  gabapentin (NEURONTIN) 300 MG capsule Take 1 capsule (300 mg total) by mouth 3 (three) times daily. Patient taking differently: Take 300 mg by mouth 2 (two) times daily.  09/29/14  Yes [name], MD  HYDROcodone-acetaminophen (NORCO/VICODIN) 5-325 MG tablet Take 1 tablet by mouth every 6 (six) hours as needed for moderate pain. 09/08/15  Yes Samantha J Rhyne, PA-C  Insulin Glargine (LANTUS SOLOSTAR) 100 UNIT/ML Solostar Pen Inject 40 Units into the skin daily at 10 pm.    Yes Historical Provider, MD  insulin lispro (HUMALOG) 100 UNIT/ML injection Inject 0-14 Units into the skin 3 (three) times daily as needed for high blood sugar (per sliding scale).   Yes Historical Provider, MD  Olopatadine HCl (PATADAY) 0.2 % SOLN Place 1 drop into both eyes daily as needed (for allergies).    Yes Historical Provider, MD  azelastine (ASTELIN) 0.1 % nasal spray Place 2 sprays into both nostrils 3 (three) times daily as needed for rhinitis. Use in each nostril as directed 03/10/14   [name], MD  BD PEN NEEDLE NANO U/F 32G X 4 MM MISC USE ONCE DAILY Patient not taking: Reported on 11/29/2015 04/03/15   [name], MD  glucose blood test strip Check 1 time daily. E11.9 One Touch Ultra Blue Test Strips Patient not taking: Reported  on 11/29/2015 03/10/14   [name], MD     Family History  Problem Relation Age of Onset  . Bladder Cancer Mother   . Alcohol abuse Father   . Melanoma Father   . Stroke Maternal Grandmother   . Heart Problems Maternal Grandmother     unknown  . Diabetes Maternal Grandmother   . Heart disease Maternal Grandfather   . Prostate cancer Maternal Grandfather     Social History   Social History  . Marital status: Married    Spouse name: N/A  . Number of children: 0  . Years of education: N/A   Social History Main Topics  . Smoking status: Former Smoker    Packs/day: 2.00    Years: 32.00    Types: Cigarettes    Quit date: 05/11/2010  . Smokeless tobacco: Never Used  . Alcohol use No  . Drug use: No  . Sexual activity: Not on file  Other Topics Concern  . 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  All other systems are negative.  Review of Systems  Constitutional: Positive for activity change. Negative for fever.  Respiratory: Positive for shortness of breath.   Cardiovascular: Negative for chest pain.  Gastrointestinal: Negative for abdominal pain.  Neurological: Negative for weakness.  Psychiatric/Behavioral: Negative for behavioral problems and confusion.    Vital Signs: BP (!) 145/60 (BP Location: Left Arm)   Pulse 73   Temp 97.7 F (36.5 C) (Oral)   Resp 20   Ht 5\' 10"  (1.778 m)   Wt 209 lb 1.6 oz (94.8 kg)   SpO2 100%   BMI 30.00 kg/m   Physical Exam  Constitutional: He is oriented to person, place, and time. He appears well-nourished.  Cardiovascular: Normal rate, regular rhythm and normal heart sounds.   Pulmonary/Chest: Effort normal. He has no wheezes. He has rales.  Abdominal: Soft. Bowel sounds are normal. There is tenderness.  Musculoskeletal: Normal range of motion.  Right arm graft no thrill; no  pulse RUA dialysis graft  Neurological: He is alert and oriented to person, place, and time.  Skin: Skin is warm and dry.  Psychiatric: He has a normal mood and affect. His behavior is normal. Judgment and thought content normal.  Nursing note and vitals reviewed.   Mallampati Score:  MD Evaluation Airway: WNL Heart: WNL Abdomen: WNL Chest/ Lungs: WNL ASA  Classification: 3 Mallampati/Airway Score: One  Imaging: Dg Chest 2 View  Result Date: 11/30/2015 CLINICAL DATA:  2-3 days of productive cough and shortness of breath. EXAM: CHEST  2 VIEW COMPARISON:  Portable chest x-ray of November 28, 2015 FINDINGS: The lungs are adequately inflated. The interstitial markings are coarse but have improved significantly since the study of 2 days ago. Heart remains top-normal in size. The pulmonary vascularity is not engorged today. The ICD is in stable position. The mediastinum is normal in width. There is a a trace of pleural fluid blunting the costophrenic angles on the left. The observed bony thorax exhibits no acute abnormality. IMPRESSION: Marked improvement in pulmonary edema since the previous study. Persistent coarse infrahilar lung markings greatest on the right likely reflect atelectasis. Trace left pleural effusion. Electronically Signed   By: David  Martinique M.D.   On: 11/30/2015 07:44    Labs:  CBC:  Recent Labs  01/26/15 0612  06/18/15 1335 06/25/15 1449 09/08/15 0636 11/29/15 0810  WBC  --   --   --   --   --  9.7  HGB 10.5*  < > 9.7* 9.0* 11.2* 9.0*  HCT 31.0*  --   --   --  33.0* 27.5*  PLT  --   --   --   --   --  156  < > = values in this interval not displayed.  COAGS:  Recent Labs  11/29/15 0810  INR 1.15  APTT 30    BMP:  Recent Labs  06/18/15 1338 07/22/15 1225 09/08/15 0636 11/29/15 0810 11/30/15 0158  NA 135 138 137 133* 134*  K 3.0* 3.6 3.7 3.9 3.7  CL 102 103  --  97* 98*  CO2 21* 27  --  24 25  GLUCOSE 195* 91 62* 371* 251*  BUN 88* 14  --   45* 62*  CALCIUM  7.1* 7.8*  --  8.0* 7.7*  CREATININE 8.01* 4.11*  --  7.11* 7.69*  GFRNONAA 7* 15*  --  8* 7*  GFRAA 8* 18*  --  9* 8*    LIVER FUNCTION TESTS:  Recent Labs  04/23/15 1435 05/21/15 1515 06/18/15 1338 11/29/15 0810  BILITOT  --   --   --  0.7  AST  --   --   --  17  ALT  --   --   --  14*  ALKPHOS  --   --   --  45  PROT  --   --   --  5.8*  ALBUMIN 2.4* 2.6* 2.3* 2.6*    TUMOR MARKERS: No results for input(s): AFPTM, CEA, CA199, CHROMGRNA in the last 8760 hours.  Assessment and Plan:  Clotted Right upper arm dialysis graft  Scheduled now for thrombolysis with possible angioplasty/stent placement. Possible tunneled dialysis catheter placement if needed Risks and Benefits discussed with the patient including, but not limited to bleeding, infection, vascular injury, pulmonary embolism, need for tunneled HD catheter placement or even death. All of the patient's questions were answered, patient is agreeable to proceed. Consent signed and in chart  Thank you for this interesting consult.  I greatly enjoyed meeting CHADLEY DZIEDZIC and look forward to participating in their care.  A copy of this report was sent to the requesting provider on this date.  Electronically Signed: Treylan Mcclintock A 11/30/2015, 8:06 AM   I spent a total of 20 Minutes    in face to face in clinical consultation, greater than 50% of which was counseling/coordinating care for RUE dialysis graft declot

## 2015-11-30 NOTE — Progress Notes (Signed)
PROGRESS NOTE    REQUAN Bullock  VZD:[id number] DOB: [birth year] DOA: 11/29/2015 PCP: [name], MD   Brief Narrative:  Anthony Bullock is a 55 y.o. male extensive medical history including CHF, ESRD on HD MWF, last HD on 11/17 on transplant list , DM, CAD, s/p AICD,  transferred from [facility] for continuation of care. He presented with respiratory distress requiring Bipap for O2 57% and now on 4 L O2. Patient had been seen at the ED several times prior with same symptoms over the last week, treated at the ED, given doxycycline for possible bronchitis and sent home.  At [facility], he received Albuterol x3, Solumedrol 125 mg and other respiratory management with improvement of symptoms. He reports brown sputum is his cough, for about 1 week. Patient was transferred to [facility] for Dialysis and found to have an occluded AV Graft Fistula. Underwent IR Angiojet and Angioplasty removal and then Hemodialysis today.   Assessment & Plan:   Active Problems:   Poorly controlled type II diabetes mellitus with renal complication (HCC)   Hyperlipidemia   Adjustment disorder with depressed mood   Essential hypertension   Anemia   Chronic kidney disease (CKD), stage IV (severe) (HCC)   Diabetic peripheral neuropathy (HCC)   COPD (chronic obstructive pulmonary disease) (HCC)   Chronic systolic congestive heart failure (HCC)   CAD (coronary artery disease)   NICM (nonischemic cardiomyopathy) (HCC)   Osteoarthritis of multiple joints   At high risk for falls   Peripheral vascular disease (HCC)   Acute respiratory failure with hypoxia (HCC)   CHF exacerbation (HCC)   ESRD (end stage renal disease) on dialysis ([place])  Acute Hypoxic Respiratory Failure Requiring NIPPV with BiPAP in the Setting of Volume Overload from ESRD on HD on MWF, Concomitant NICM with Chronic Systolic CHF with EF of 29% s/p AICD, and ?PNA/Bronchitis  -Improved and no longer requiring BiPAP -IV Lasix for Volume  Overload -Dialysis today  Occluded Right Upper Arm AVG s/p Angioject Thrombectomy and Angioplasty -Patent with no Significant Residual Anastomotic Stenosis per IR Dr. [name] -Patient was seen in Dialysis with working AVG  ESRD on HD MWF -BNP was 2,688.5 -Hemodialysis this Afternoon -Nephrology Following and appreciate Recc's and Evaluation  Nonischemic Cardiomyopathy with Acute Decompensation of Systolic CHF with EF of 51% s/p AICD -BNP was 2,688.5 -Likely in the Setting of Volume Overload from ESRD -C/w ASA 81 mg po Daily, Atorvastatin 80 mg po qHS, Carvedilol 12.5 mg po BID -Lasix 40 mg IV BID -ECHOcardiogram  Mildly Elevated Troponin in the Setting of ESRD -Troponin I was 0.08 -Denies CP -Continue to Monitor  ? PNA/Bronchitis -C/w  Azithromycin 500 mg po Daily and with Cegriaxone IV 1 gram q24h -Repeat CXR in AM -Sputum Cx   Hypertension -C/w Carvedilol 12.5 mg po BID, Lasix 40 mg IV BID -Will Add Hydralazine if still elevated -Continue  Hyperlipidemia -C/w Atorvastatin 80 mg po qHS and Fenofibrate 160 mg po Daily  Diatbetes Mellitus Type 2 Complicated by Neuropathy -C/w Sensitive Novolog SSI -C/w Lantus 15 units sq qHS -Repeat HbA1c 8.1 -C/w Gabapentin 300 mg po TID -Heart Healthy Carb Modified Diet  DVT prophylaxis: Heparin 5,000 units sq Code Status: FULL Family Communication: No Family present at Bedside Disposition Plan: Remain on the Floor  Consultants:   Nephrology  Interventional Radiology  Procedures: Angioject Thrombectomy and Angioplasty of Occluded Right Upper Arm AVG   Antimicrobials: IV Ceftriaxone and po Azithromycin  Subjective: Seen and examined in Dialysis and  stated he was feeling much better. No N/V/Abdominal Pain or Chest Pain. No SOB and states he became acutely SOB over the weekend. No other concerns or complaints at this time.    Objective: Vitals:   11/30/15 1400 11/30/15 1428 11/30/15 1457 11/30/15 1530  BP: 128/60 (!)  121/58 128/67 133/69  Pulse: 64 64 77 75  Resp: 14 14 14 18   Temp:      TempSrc:      SpO2:      Weight:      Height:        Intake/Output Summary (Last 24 hours) at 11/30/15 1554 Last data filed at 11/30/15 0700  Gross per 24 hour  Intake                0 ml  Output              320 ml  Net             -320 ml   Filed Weights   11/29/15 0650 11/30/15 0405  Weight: 93.8 kg (206 lb 14.4 oz) 94.8 kg (209 lb 1.6 oz)    Examination: Physical Exam:  Constitutional: WN/WD, NAD and appears calm and comfortable Eyes: Lids and conjunctivae normal, sclerae anicteric  ENMT: External Ears, Nose appear normal. Grossly normal hearing.  Neck: Appears normal, supple, no cervical masses, normal ROM, no appreciable thyromegaly Respiratory: Dminished to auscultation bilaterally, no wheezing, rales, rhonchi or crackles. Normal respiratory effort and patient is not tachypenic. No accessory muscle use.  Cardiovascular: RRR, no murmurs / rubs / gallops. S1 and S2 auscultated. Mild extremity edema.  Abdomen: Soft, non-tender, non-distended. No masses palpated. No appreciable hepatosplenomegaly. Bowel sounds positive.  GU: Deferred. Musculoskeletal: No clubbing / cyanosis of digits/nails. No joint deformity upper and lower extremities. Right Arm AV Fistula being used in Dialysis.  Skin: No rashes, lesions, ulcers. No induration; Warm and dry.  Neurologic: CN 2-12 grossly intact with no focal deficits. Sensation intact in all 4 Extremities, Romberg sign cerebellar reflexes not assessed.  Psychiatric: Normal judgment and insight. Alert and oriented x 3. Normal mood and appropriate affect.   Data Reviewed: I have personally reviewed following labs and imaging studies  CBC:  Recent Labs Lab 11/29/15 0810  WBC 9.7  NEUTROABS 9.1*  HGB 9.0*  HCT 27.5*  MCV 82.6  PLT 834   Basic Metabolic Panel:  Recent Labs Lab 11/29/15 0810 11/30/15 0158  NA 133* 134*  K 3.9 3.7  CL 97* 98*  CO2 24 25   GLUCOSE 371* 251*  BUN 45* 62*  CREATININE 7.11* 7.69*  CALCIUM 8.0* 7.7*   GFR: Estimated Creatinine Clearance: 12.7 mL/min (by C-G formula based on SCr of 7.69 mg/dL (H)). Liver Function Tests:  Recent Labs Lab 11/29/15 0810  AST 17  ALT 14*  ALKPHOS 45  BILITOT 0.7  PROT 5.8*  ALBUMIN 2.6*   No results for input(s): LIPASE, AMYLASE in the last 168 hours. No results for input(s): AMMONIA in the last 168 hours. Coagulation Profile:  Recent Labs Lab 11/29/15 0810  INR 1.15   Cardiac Enzymes:  Recent Labs Lab 11/29/15 0911  TROPONINI 0.08*   BNP (last 3 results) No results for input(s): PROBNP in the last 8760 hours. HbA1C:  Recent Labs  11/29/15 0810  HGBA1C 8.1*   CBG:  Recent Labs Lab 11/29/15 1127 11/29/15 1635 11/29/15 2326 11/30/15 0624 11/30/15 1158  GLUCAP 304* 272* 205* 242* 96   Lipid Profile: No results for  input(s): CHOL, HDL, LDLCALC, TRIG, CHOLHDL, LDLDIRECT in the last 72 hours. Thyroid Function Tests: No results for input(s): TSH, T4TOTAL, FREET4, T3FREE, THYROIDAB in the last 72 hours. Anemia Panel: No results for input(s): VITAMINB12, FOLATE, FERRITIN, TIBC, IRON, RETICCTPCT in the last 72 hours. Sepsis Labs: No results for input(s): PROCALCITON, LATICACIDVEN in the last 168 hours.  Recent Results (from the past 240 hour(s))  Culture, blood (Routine X 2) w Reflex to ID Panel     Status: None (Preliminary result)   Collection Time: 11/29/15  9:11 AM  Result Value Ref Range Status   Specimen Description BLOOD LEFT ANTECUBITAL  Final   Special Requests BOTTLES DRAWN AEROBIC ONLY 5CC  Final   Culture NO GROWTH 1 DAY  Final   Report Status PENDING  Incomplete  Culture, blood (Routine X 2) w Reflex to ID Panel     Status: None (Preliminary result)   Collection Time: 11/29/15  9:15 AM  Result Value Ref Range Status   Specimen Description BLOOD LEFT HAND  Final   Special Requests BOTTLES DRAWN AEROBIC ONLY 5CC  Final   Culture NO  GROWTH 1 DAY  Final   Report Status PENDING  Incomplete    Radiology Studies: Dg Chest 2 View  Result Date: 11/30/2015 CLINICAL DATA:  2-3 days of productive cough and shortness of breath. EXAM: CHEST  2 VIEW COMPARISON:  Portable chest x-ray of November 28, 2015 FINDINGS: The lungs are adequately inflated. The interstitial markings are coarse but have improved significantly since the study of 2 days ago. Heart remains top-normal in size. The pulmonary vascularity is not engorged today. The ICD is in stable position. The mediastinum is normal in width. There is a a trace of pleural fluid blunting the costophrenic angles on the left. The observed bony thorax exhibits no acute abnormality. IMPRESSION: Marked improvement in pulmonary edema since the previous study. Persistent coarse infrahilar lung markings greatest on the right likely reflect atelectasis. Trace left pleural effusion. Electronically Signed   By: David  Martinique M.D.   On: 11/30/2015 07:44   Ir US Guide Vasc Access Right  Result Date: 11/30/2015 CLINICAL DATA:  Thrombosis of right upper arm dialysis graft. EXAM: 1. ULTRASOUND GUIDANCE FOR VASCULAR ACCESS OF DIALYSIS GRAFT. 2. DIALYSIS GRAFT DECLOT PROCEDURE WITH TWO SEPARATE GRAFT ACCESS SITES. 3. VENOUS ANGIOPLASTY OF DIALYSIS GRAFT VENOUS ANASTOMOSIS. ANESTHESIA/SEDATION: 2.0 mg IV Versed; 100 mcg IV Fentanyl. Total Moderate Sedation Time 52 minutes. CONTRAST:  88mL ISOVUE-300 IOPAMIDOL (ISOVUE-300) INJECTION 61% MEDICATIONS: 2 mg tPA, 3000 U IV heparin FLUOROSCOPY TIME:  3 minutes and 48 seconds.  21 mGy. PROCEDURE: The procedure, risks, benefits, and alternatives were explained to the patient. Questions regarding the procedure were encouraged and answered. The patient understands and consents to the procedure. A time-out was performed prior to initiating the procedure. The right upper arm dialysis graft was prepped with chlorhexidine in a sterile fashion, and a sterile drape was applied  covering the operative field. A sterile gown and sterile gloves were used for the procedure. Local anesthesia was provided with 1% Lidocaine. Preliminary ultrasound was performed of the right upper arm dialysis graft. Both antegrade and retrograde graft access was performed with micropuncture sets under direct ultrasound guidance. Ultrasound image documentation was performed. t-PA was instilled via each access. 7-French antegrade and 6-French retrograde sheaths were placed. A diagnostic catheter was advanced and contrast injection performed at the level of patent venous outflow. Outflow venography was also performed via the catheter. Balloon angioplasty was performed  at the level of the venous anastomosis with a 7 mm x 4 cm Conquest balloon. Mechanical thrombectomy was performed within the dialysis graft with the Angiojet device. Thrombectomy across the arterial anastomosis was then performed with a 4-French Fogarty balloon catheter. Several passes were made with the Fogarty catheter. Suction thrombectomy was then performed through the antegrade sheath. Graft patency was reassessed with angiography. Upon completion of the procedure, both sheaths were removed and hemostasis obtained with Ethilon pursestring sutures. COMPLICATIONS: None FINDINGS: Ultrasound confirms thrombosis of the graft. Etiology of thrombosis is a tight stenosis at the venous anastomosis of the graft with the axillary vein. After reestablishing flow in the graft and clearing thrombus, the anastomosis responded well to 7 mm balloon angioplasty with no significant residual stenosis remaining. Rapid flow was re-established in the graft. The arterial anastomosis is widely patent after thrombectomy. Venous outflow is normally patent including central veins in the chest. IMPRESSION: Successful declot procedure to reestablish flow in an occluded right upper arm dialysis graft. Etiology was stenosis at the venous anastomosis which responded well to 7 mm  balloon angioplasty. ACCESS: The graft remains amenable to percutaneous intervention. Electronically Signed   By: Aletta Edouard M.D.   On: 11/30/2015 11:56   Ir Thrombectomy Av Fistula/w Thrombolysis/pta Inc Shunt/img Right  Result Date: 11/30/2015 CLINICAL DATA:  Thrombosis of right upper arm dialysis graft. EXAM: 1. ULTRASOUND GUIDANCE FOR VASCULAR ACCESS OF DIALYSIS GRAFT. 2. DIALYSIS GRAFT DECLOT PROCEDURE WITH TWO SEPARATE GRAFT ACCESS SITES. 3. VENOUS ANGIOPLASTY OF DIALYSIS GRAFT VENOUS ANASTOMOSIS. ANESTHESIA/SEDATION: 2.0 mg IV Versed; 100 mcg IV Fentanyl. Total Moderate Sedation Time 52 minutes. CONTRAST:  47mL ISOVUE-300 IOPAMIDOL (ISOVUE-300) INJECTION 61% MEDICATIONS: 2 mg tPA, 3000 U IV heparin FLUOROSCOPY TIME:  3 minutes and 48 seconds.  21 mGy. PROCEDURE: The procedure, risks, benefits, and alternatives were explained to the patient. Questions regarding the procedure were encouraged and answered. The patient understands and consents to the procedure. A time-out was performed prior to initiating the procedure. The right upper arm dialysis graft was prepped with chlorhexidine in a sterile fashion, and a sterile drape was applied covering the operative field. A sterile gown and sterile gloves were used for the procedure. Local anesthesia was provided with 1% Lidocaine. Preliminary ultrasound was performed of the right upper arm dialysis graft. Both antegrade and retrograde graft access was performed with micropuncture sets under direct ultrasound guidance. Ultrasound image documentation was performed. t-PA was instilled via each access. 7-French antegrade and 6-French retrograde sheaths were placed. A diagnostic catheter was advanced and contrast injection performed at the level of patent venous outflow. Outflow venography was also performed via the catheter. Balloon angioplasty was performed at the level of the venous anastomosis with a 7 mm x 4 cm Conquest balloon. Mechanical thrombectomy was  performed within the dialysis graft with the Angiojet device. Thrombectomy across the arterial anastomosis was then performed with a 4-French Fogarty balloon catheter. Several passes were made with the Fogarty catheter. Suction thrombectomy was then performed through the antegrade sheath. Graft patency was reassessed with angiography. Upon completion of the procedure, both sheaths were removed and hemostasis obtained with Ethilon pursestring sutures. COMPLICATIONS: None FINDINGS: Ultrasound confirms thrombosis of the graft. Etiology of thrombosis is a tight stenosis at the venous anastomosis of the graft with the axillary vein. After reestablishing flow in the graft and clearing thrombus, the anastomosis responded well to 7 mm balloon angioplasty with no significant residual stenosis remaining. Rapid flow was re-established in the graft. The arterial  anastomosis is widely patent after thrombectomy. Venous outflow is normally patent including central veins in the chest. IMPRESSION: Successful declot procedure to reestablish flow in an occluded right upper arm dialysis graft. Etiology was stenosis at the venous anastomosis which responded well to 7 mm balloon angioplasty. ACCESS: The graft remains amenable to percutaneous intervention. Electronically Signed   By: Aletta Edouard M.D.   On: 11/30/2015 11:56   Scheduled Meds: . alteplase      . aspirin EC  81 mg Oral Daily  . atorvastatin  80 mg Oral QHS  . azithromycin  500 mg Oral Daily  . carvedilol  12.5 mg Oral BID  . cefTRIAXone (ROCEPHIN)  IV  1 g Intravenous Q24H  . fenofibrate  160 mg Oral Daily  . fentaNYL      . furosemide  40 mg Intravenous BID  . gabapentin  300 mg Oral TID  . heparin      . [START ON 12/01/2015] heparin  5,000 Units Subcutaneous Q8H  . insulin aspart  0-9 Units Subcutaneous TID WC  . insulin glargine  15 Units Subcutaneous QHS  . midazolam      . sodium chloride flush  3 mL Intravenous Q12H   Continuous Infusions:    LOS: 1 day   Kerney Elbe, DO [facility] Pager [phone]  If 7PM-7AM, please contact night-coverage [email or web address] Password [facility] 11/30/2015, 3:54 PM

## 2015-12-01 ENCOUNTER — Other Ambulatory Visit (HOSPITAL_COMMUNITY): Payer: BLUE CROSS/BLUE SHIELD

## 2015-12-01 LAB — GLUCOSE, CAPILLARY
Glucose-Capillary: 176 mg/dL — ABNORMAL HIGH (ref 65–99)
Glucose-Capillary: 146 mg/dL — ABNORMAL HIGH (ref 65–99)

## 2015-12-01 LAB — CBC WITH DIFFERENTIAL/PLATELET
Basophils Absolute: 0 10*3/uL (ref 0.0–0.1)
Basophils Relative: 0 %
Eosinophils Absolute: 0.2 10*3/uL (ref 0.0–0.7)
Eosinophils Relative: 2 %
HCT: 29.3 % — ABNORMAL LOW (ref 39.0–52.0)
Hemoglobin: 9.1 g/dL — ABNORMAL LOW (ref 13.0–17.0)
Lymphocytes Relative: 17 %
Lymphs Abs: 1.6 10*3/uL (ref 0.7–4.0)
MCH: 26.1 pg (ref 26.0–34.0)
MCHC: 31.1 g/dL (ref 30.0–36.0)
MCV: 84 fL (ref 78.0–100.0)
Monocytes Absolute: 0.6 10*3/uL (ref 0.1–1.0)
Monocytes Relative: 6 %
Neutro Abs: 6.9 10*3/uL (ref 1.7–7.7)
Neutrophils Relative %: 75 %
Platelets: 198 10*3/uL (ref 150–400)
RBC: 3.49 MIL/uL — ABNORMAL LOW (ref 4.22–5.81)
RDW: 16.2 % — ABNORMAL HIGH (ref 11.5–15.5)
WBC: 9.3 10*3/uL (ref 4.0–10.5)

## 2015-12-01 LAB — BASIC METABOLIC PANEL
Anion gap: 8 (ref 5–15)
BUN: 33 mg/dL — ABNORMAL HIGH (ref 6–20)
CO2: 30 mmol/L (ref 22–32)
Calcium: 7.8 mg/dL — ABNORMAL LOW (ref 8.9–10.3)
Chloride: 97 mmol/L — ABNORMAL LOW (ref 101–111)
Creatinine, Ser: 5.09 mg/dL — ABNORMAL HIGH (ref 0.61–1.24)
GFR calc Af Amer: 14 mL/min — ABNORMAL LOW (ref >60)
GFR calc non Af Amer: 12 mL/min — ABNORMAL LOW (ref >60)
Glucose, Bld: 200 mg/dL — ABNORMAL HIGH (ref 65–99)
Potassium: 3.5 mmol/L (ref 3.5–5.1)
Sodium: 135 mmol/L (ref 135–145)

## 2015-12-01 MED ORDER — CARVEDILOL 12.5 MG PO TABS: 12.5000 mg | ORAL_TABLET | Freq: Two times a day (BID) | ORAL | 0 refills | Status: DC

## 2015-12-01 NOTE — Discharge Summary (Signed)
Physician Discharge Summary  Anthony Bullock:[id number] DOB: Dec 18, 1960 DOA: 11/29/2015  PCP: [name], MD  Admit date: 11/29/2015 Discharge date: 12/01/2015  Admitted From: Home Disposition:  Home  Recommendations for Outpatient Follow-up:  1. Follow up with PCP in 1-2 weeks 2. Follow up with Nephrology and regularly scheduled Dialysis Treatments 3. Obtain Echocardiogram as an outpatient. 4. Please obtain BMP/CBC in one week  Home Health: No Equipment/Devices: None  Discharge Condition: Stable CODE STATUS: FULL Diet recommendation: Heart Healthy / Carb Modified / Regular / Dysphagia   Brief/Interim Summary: Anthony Bullock a 55 y.o.maleextensive medical history including CHF, ESRD on HD MWF, last HD on 11/17 on transplant list , DM, CAD, s/p AICD, transferred from [facility] for continuation of care. He presented with respiratory distress requiring Bipap for O2 57% and now on 4 L O2. Patient had been seen at the ED several times prior with same symptoms over the last week, treated at the ED, given doxycycline for possible bronchitis and sent home. At [facility], he received Albuterol x3, Solumedrol 125 mg and other respiratory management with improvement of symptoms. He reports brown sputum is his cough, for about 1 week. Patient was transferred to [facility] for Dialysis and found to have an occluded AV Graft Fistula. Underwent IR Angiojet and Angioplasty removal and then Hemodialysis yesterday. His symptoms have improved and he did well. Nephrology saw patient and told him that they will try to reduce his dry weight. He was medically stable for D/C and will be D/C'd home and follow up with PCP, Nephrology, and Regularly scheduled Dialysis Schedule.   Discharge Diagnoses:  Active Problems:   Poorly controlled type II diabetes mellitus with renal complication (HCC)   Hyperlipidemia   Adjustment disorder with depressed mood   Essential hypertension   Anemia    Chronic kidney disease (CKD), stage IV (severe) (HCC)   Diabetic peripheral neuropathy (HCC)   COPD (chronic obstructive pulmonary disease) (HCC)   Chronic systolic congestive heart failure (HCC)   CAD (coronary artery disease)   NICM (nonischemic cardiomyopathy) (HCC)   Osteoarthritis of multiple joints   At high risk for falls   Peripheral vascular disease (HCC)   Acute respiratory failure with hypoxia (HCC)   CHF exacerbation (HCC)   ESRD (end stage renal disease) on dialysis ([place])  Acute Hypoxic Respiratory Failure Requiring NIPPV with BiPAP in the Setting of Volume Overload from ESRD on HD on MWF, Concomitant NICM with Chronic Systolic CHF with EF of 03% s/p AICD, and ?PNA/Bronchitis  -Improved and no longer requiring BiPAP or Oxygen -IV Lasix for Volume Overload given on Admission -Dialysis yesterday improved Symptoms -Follow up with Regularly Scheduled Dialysis MWF -ECHOCardiogram as an outpatient  Occluded Right Upper Arm AVG s/p Angioject Thrombectomy and Angioplasty -Patent with no Significant Residual Anastomotic Stenosis per IR Dr. [name] -Patient was seen in Dialysis with working AVG  ESRD on HD MWF -BNP was 2,688.5 -Hemodialysis yesterday -Nephrology Followed and appreciated Recc's and Evaluation; Stable to D/C from their perspective and continue regularly scheduled Dialysis sessions MWF  Nonischemic Cardiomyopathy with Acute Decompensation of Systolic CHF with EF of 55% s/p AICD -BNP was 2,688.5 -Likely in the Setting of Volume Overload from ESRD -C/w ASA 81 mg po Daily, Atorvastatin 80 mg po qHS, Carvedilol 12.5 mg po BID -Lasix 40 mg IV BID given on Admission -ECHOcardiogram as an outpatient; Recent ECHO in August  Mildly Elevated Troponin in the Setting of ESRD -Troponin I was 0.08 -Denies CP -Continue  to Monitor  ? PNA/Bronchitis -Was given Azithromycin 500 mg po Daily and with Cegriaxone IV 1 gram q24h -Repeat CXR as an outpatient -Continue with  Doxycycline course given as an outpatient.  Hypertension -C/w Carvedilol 12.5 mg po BID, -Continue to Monitor and Follow up with PCP  Hyperlipidemia -C/w Atorvastatin 80 mg po qHS and Fenofibrate 160 mg po Daily  Diatbetes Mellitus Type 2 Complicated by Neuropathy -C/w Home Regimin -C/w Lantus 15 units sq qHS -Repeat HbA1c 8.1 -C/w Gabapentin 300 mg po TID -Heart Healthy Renal Dialysis Carb Modified Diet  -Follow up as an Outpatient  Discharge Instructions  Discharge Instructions    Call MD for:  difficulty breathing, headache or visual disturbances    Complete by:  As directed    Call MD for:  persistant dizziness or light-headedness    Complete by:  As directed    Call MD for:  persistant nausea and vomiting    Complete by:  As directed    Call MD for:  severe uncontrolled pain    Complete by:  As directed    Diet - low sodium heart healthy    Complete by:  As directed    Discharge instructions    Complete by:  As directed    Follow up with PCP and with Nephrology as an outpatient. Will need to Continue Regular Dialysis Schedule. Take all medications as prescribed. If symptoms change or worsen please return to the ER for Evaluation.   Increase activity slowly    Complete by:  As directed        Medication List    TAKE these medications   aspirin EC 81 MG tablet Take 81 mg by mouth daily.   atorvastatin 80 MG tablet Commonly known as:  LIPITOR Take 1 tablet (80 mg total) by mouth at bedtime.   azelastine 0.1 % nasal spray Commonly known as:  ASTELIN Place 2 sprays into both nostrils 3 (three) times daily as needed for rhinitis. Use in each nostril as directed   BD PEN NEEDLE NANO U/F 32G X 4 MM Misc Generic drug:  Insulin Pen Needle USE ONCE DAILY   carvedilol 12.5 MG tablet Commonly known as:  COREG Take 1 tablet (12.5 mg total) by mouth 2 (two) times daily with a meal. What changed:  when to take this   doxycycline 100 MG capsule Commonly known as:   VIBRAMYCIN Take 100 mg by mouth 2 (two) times daily. For 10 days   fenofibrate 160 MG tablet Take 1 tablet (160 mg total) by mouth daily.   fexofenadine 180 MG tablet Commonly known as:  ALLEGRA Take 180 mg by mouth daily.   gabapentin 300 MG capsule Commonly known as:  NEURONTIN Take 1 capsule (300 mg total) by mouth 3 (three) times daily. What changed:  when to take this   glucose blood test strip Check 1 time daily. E11.9 One Touch Ultra Blue Test Strips   HYDROcodone-acetaminophen 5-325 MG tablet Commonly known as:  NORCO/VICODIN Take 1 tablet by mouth every 6 (six) hours as needed for moderate pain.   insulin lispro 100 UNIT/ML injection Commonly known as:  HUMALOG Inject 0-14 Units into the skin 3 (three) times daily as needed for high blood sugar (per sliding scale).   LANTUS SOLOSTAR 100 UNIT/ML Solostar Pen Generic drug:  Insulin Glargine Inject 40 Units into the skin daily at 10 pm.   PATADAY 0.2 % Soln Generic drug:  Olopatadine HCl Place 1 drop into both eyes daily  as needed (for allergies).       Allergies  Allergen Reactions  . Morphine Sulfate Rash and Other (See Comments)    REACTION: Itches all over, red spots    Consultations:  Nephrology  Interventional Radiology  Procedures/Studies: Dg Chest 2 View  Result Date: 11/30/2015 CLINICAL DATA:  2-3 days of productive cough and shortness of breath. EXAM: CHEST  2 VIEW COMPARISON:  Portable chest x-ray of November 28, 2015 FINDINGS: The lungs are adequately inflated. The interstitial markings are coarse but have improved significantly since the study of 2 days ago. Heart remains top-normal in size. The pulmonary vascularity is not engorged today. The ICD is in stable position. The mediastinum is normal in width. There is a a trace of pleural fluid blunting the costophrenic angles on the left. The observed bony thorax exhibits no acute abnormality. IMPRESSION: Marked improvement in pulmonary edema since  the previous study. Persistent coarse infrahilar lung markings greatest on the right likely reflect atelectasis. Trace left pleural effusion. Electronically Signed   By: David  Martinique M.D.   On: 11/30/2015 07:44   Ir US Guide Vasc Access Right  Result Date: 11/30/2015 CLINICAL DATA:  Thrombosis of right upper arm dialysis graft. EXAM: 1. ULTRASOUND GUIDANCE FOR VASCULAR ACCESS OF DIALYSIS GRAFT. 2. DIALYSIS GRAFT DECLOT PROCEDURE WITH TWO SEPARATE GRAFT ACCESS SITES. 3. VENOUS ANGIOPLASTY OF DIALYSIS GRAFT VENOUS ANASTOMOSIS. ANESTHESIA/SEDATION: 2.0 mg IV Versed; 100 mcg IV Fentanyl. Total Moderate Sedation Time 52 minutes. CONTRAST:  9mL ISOVUE-300 IOPAMIDOL (ISOVUE-300) INJECTION 61% MEDICATIONS: 2 mg tPA, 3000 U IV heparin FLUOROSCOPY TIME:  3 minutes and 48 seconds.  21 mGy. PROCEDURE: The procedure, risks, benefits, and alternatives were explained to the patient. Questions regarding the procedure were encouraged and answered. The patient understands and consents to the procedure. A time-out was performed prior to initiating the procedure. The right upper arm dialysis graft was prepped with chlorhexidine in a sterile fashion, and a sterile drape was applied covering the operative field. A sterile gown and sterile gloves were used for the procedure. Local anesthesia was provided with 1% Lidocaine. Preliminary ultrasound was performed of the right upper arm dialysis graft. Both antegrade and retrograde graft access was performed with micropuncture sets under direct ultrasound guidance. Ultrasound image documentation was performed. t-PA was instilled via each access. 7-French antegrade and 6-French retrograde sheaths were placed. A diagnostic catheter was advanced and contrast injection performed at the level of patent venous outflow. Outflow venography was also performed via the catheter. Balloon angioplasty was performed at the level of the venous anastomosis with a 7 mm x 4 cm Conquest balloon. Mechanical  thrombectomy was performed within the dialysis graft with the Angiojet device. Thrombectomy across the arterial anastomosis was then performed with a 4-French Fogarty balloon catheter. Several passes were made with the Fogarty catheter. Suction thrombectomy was then performed through the antegrade sheath. Graft patency was reassessed with angiography. Upon completion of the procedure, both sheaths were removed and hemostasis obtained with Ethilon pursestring sutures. COMPLICATIONS: None FINDINGS: Ultrasound confirms thrombosis of the graft. Etiology of thrombosis is a tight stenosis at the venous anastomosis of the graft with the axillary vein. After reestablishing flow in the graft and clearing thrombus, the anastomosis responded well to 7 mm balloon angioplasty with no significant residual stenosis remaining. Rapid flow was re-established in the graft. The arterial anastomosis is widely patent after thrombectomy. Venous outflow is normally patent including central veins in the chest. IMPRESSION: Successful declot procedure to reestablish flow in an  occluded right upper arm dialysis graft. Etiology was stenosis at the venous anastomosis which responded well to 7 mm balloon angioplasty. ACCESS: The graft remains amenable to percutaneous intervention. Electronically Signed   By: Aletta Edouard M.D.   On: 11/30/2015 11:56   Ir Thrombectomy Av Fistula/w Thrombolysis/pta Inc Shunt/img Right  Result Date: 11/30/2015 CLINICAL DATA:  Thrombosis of right upper arm dialysis graft. EXAM: 1. ULTRASOUND GUIDANCE FOR VASCULAR ACCESS OF DIALYSIS GRAFT. 2. DIALYSIS GRAFT DECLOT PROCEDURE WITH TWO SEPARATE GRAFT ACCESS SITES. 3. VENOUS ANGIOPLASTY OF DIALYSIS GRAFT VENOUS ANASTOMOSIS. ANESTHESIA/SEDATION: 2.0 mg IV Versed; 100 mcg IV Fentanyl. Total Moderate Sedation Time 52 minutes. CONTRAST:  30mL ISOVUE-300 IOPAMIDOL (ISOVUE-300) INJECTION 61% MEDICATIONS: 2 mg tPA, 3000 U IV heparin FLUOROSCOPY TIME:  3 minutes and 48  seconds.  21 mGy. PROCEDURE: The procedure, risks, benefits, and alternatives were explained to the patient. Questions regarding the procedure were encouraged and answered. The patient understands and consents to the procedure. A time-out was performed prior to initiating the procedure. The right upper arm dialysis graft was prepped with chlorhexidine in a sterile fashion, and a sterile drape was applied covering the operative field. A sterile gown and sterile gloves were used for the procedure. Local anesthesia was provided with 1% Lidocaine. Preliminary ultrasound was performed of the right upper arm dialysis graft. Both antegrade and retrograde graft access was performed with micropuncture sets under direct ultrasound guidance. Ultrasound image documentation was performed. t-PA was instilled via each access. 7-French antegrade and 6-French retrograde sheaths were placed. A diagnostic catheter was advanced and contrast injection performed at the level of patent venous outflow. Outflow venography was also performed via the catheter. Balloon angioplasty was performed at the level of the venous anastomosis with a 7 mm x 4 cm Conquest balloon. Mechanical thrombectomy was performed within the dialysis graft with the Angiojet device. Thrombectomy across the arterial anastomosis was then performed with a 4-French Fogarty balloon catheter. Several passes were made with the Fogarty catheter. Suction thrombectomy was then performed through the antegrade sheath. Graft patency was reassessed with angiography. Upon completion of the procedure, both sheaths were removed and hemostasis obtained with Ethilon pursestring sutures. COMPLICATIONS: None FINDINGS: Ultrasound confirms thrombosis of the graft. Etiology of thrombosis is a tight stenosis at the venous anastomosis of the graft with the axillary vein. After reestablishing flow in the graft and clearing thrombus, the anastomosis responded well to 7 mm balloon angioplasty with  no significant residual stenosis remaining. Rapid flow was re-established in the graft. The arterial anastomosis is widely patent after thrombectomy. Venous outflow is normally patent including central veins in the chest. IMPRESSION: Successful declot procedure to reestablish flow in an occluded right upper arm dialysis graft. Etiology was stenosis at the venous anastomosis which responded well to 7 mm balloon angioplasty. ACCESS: The graft remains amenable to percutaneous intervention. Electronically Signed   By: Aletta Edouard M.D.   On: 11/30/2015 11:56     Subjective: Seen and examined at bedside and doing tremendously better. No N/V/Abdominal Pain or CP/SOB. No other concerns or complaints and ready to go home.  Discharge Exam: Vitals:   12/01/15 0400 12/01/15 1105  BP: (!) 127/56 (!) 133/54  Pulse: 77 80  Resp: 20   Temp: 97.9 F (36.6 C)    Vitals:   11/30/15 1630 11/30/15 1950 12/01/15 0400 12/01/15 1105  BP: 132/76 125/61 (!) 127/56 (!) 133/54  Pulse: 79 79 77 80  Resp: 17 20 20    Temp: 97 F (36.1  C) 98 F (36.7 C) 97.9 F (36.6 C)   TempSrc: Oral Oral Oral   SpO2: 97% 99% 99%   Weight: 91.6 kg (201 lb 15.1 oz)  89.7 kg (197 lb 11.2 oz)   Height:       General: Pt is alert, awake, not in acute distress Cardiovascular: RRR, S1/S2 +, no rubs, no gallops Respiratory: CTA bilaterally, no wheezing, no rhonchi Abdominal: Soft, NT, ND, bowel sounds + Extremities: no edema, no cyanosis; Right AVF with good thrill and bruit  The results of significant diagnostics from this hospitalization (including imaging, microbiology, ancillary and laboratory) are listed below for reference.    Microbiology: Recent Results (from the past 240 hour(s))  Culture, blood (Routine X 2) w Reflex to ID Panel     Status: None (Preliminary result)   Collection Time: 11/29/15  9:11 AM  Result Value Ref Range Status   Specimen Description BLOOD LEFT ANTECUBITAL  Final   Special Requests BOTTLES  DRAWN AEROBIC ONLY 5CC  Final   Culture NO GROWTH 2 DAYS  Final   Report Status PENDING  Incomplete  Culture, blood (Routine X 2) w Reflex to ID Panel     Status: None (Preliminary result)   Collection Time: 11/29/15  9:15 AM  Result Value Ref Range Status   Specimen Description BLOOD LEFT HAND  Final   Special Requests BOTTLES DRAWN AEROBIC ONLY 5CC  Final   Culture NO GROWTH 2 DAYS  Final   Report Status PENDING  Incomplete    Labs: BNP (last 3 results)  Recent Labs  11/29/15 0810  BNP 6,195.0*   Basic Metabolic Panel:  Recent Labs Lab 11/29/15 0810 11/30/15 0158 12/01/15 0155  NA 133* 134* 135  K 3.9 3.7 3.5  CL 97* 98* 97*  CO2 24 25 30   GLUCOSE 371* 251* 200*  BUN 45* 62* 33*  CREATININE 7.11* 7.69* 5.09*  CALCIUM 8.0* 7.7* 7.8*   Liver Function Tests:  Recent Labs Lab 11/29/15 0810  AST 17  ALT 14*  ALKPHOS 45  BILITOT 0.7  PROT 5.8*  ALBUMIN 2.6*   No results for input(s): LIPASE, AMYLASE in the last 168 hours. No results for input(s): AMMONIA in the last 168 hours. CBC:  Recent Labs Lab 11/29/15 0810 12/01/15 1007  WBC 9.7 9.3  NEUTROABS 9.1* 6.9  HGB 9.0* 9.1*  HCT 27.5* 29.3*  MCV 82.6 84.0  PLT 156 198   Cardiac Enzymes:  Recent Labs Lab 11/29/15 0911  TROPONINI 0.08*   BNP: Invalid input(s): POCBNP CBG:  Recent Labs Lab 11/30/15 0624 11/30/15 1158 11/30/15 2135 12/01/15 0617 12/01/15 1149  GLUCAP 242* 96 152* 176* 146*   D-Dimer No results for input(s): DDIMER in the last 72 hours. Hgb A1c  Recent Labs  11/29/15 0810  HGBA1C 8.1*   Lipid Profile No results for input(s): CHOL, HDL, LDLCALC, TRIG, CHOLHDL, LDLDIRECT in the last 72 hours. Thyroid function studies No results for input(s): TSH, T4TOTAL, T3FREE, THYROIDAB in the last 72 hours.  Invalid input(s): FREET3 Anemia work up No results for input(s): VITAMINB12, FOLATE, FERRITIN, TIBC, IRON, RETICCTPCT in the last 72 hours. Urinalysis    Component Value  Date/Time   COLORURINE yellow 09/28/2009 1317   APPEARANCEUR Clear 09/28/2009 1317   LABSPEC 1.010 09/28/2009 1317   PHURINE 5.0 09/28/2009 1317   HGBUR 2+ 09/28/2009 1317   BILIRUBINUR neg 12/26/2011 1604   PROTEINUR +++ 12/26/2011 1604   UROBILINOGEN 0.2 12/26/2011 1604   UROBILINOGEN 0.2 09/28/2009 1317  NITRITE neg 12/26/2011 1604   NITRITE negative 09/28/2009 1317   LEUKOCYTESUR Negative 12/26/2011 1604   Sepsis Labs Invalid input(s): PROCALCITONIN,  WBC,  LACTICIDVEN Microbiology Recent Results (from the past 240 hour(s))  Culture, blood (Routine X 2) w Reflex to ID Panel     Status: None (Preliminary result)   Collection Time: 11/29/15  9:11 AM  Result Value Ref Range Status   Specimen Description BLOOD LEFT ANTECUBITAL  Final   Special Requests BOTTLES DRAWN AEROBIC ONLY 5CC  Final   Culture NO GROWTH 2 DAYS  Final   Report Status PENDING  Incomplete  Culture, blood (Routine X 2) w Reflex to ID Panel     Status: None (Preliminary result)   Collection Time: 11/29/15  9:15 AM  Result Value Ref Range Status   Specimen Description BLOOD LEFT HAND  Final   Special Requests BOTTLES DRAWN AEROBIC ONLY 5CC  Final   Culture NO GROWTH 2 DAYS  Final   Report Status PENDING  Incomplete   Time coordinating discharge: Over 30 minutes  SIGNED:  Kerney Elbe, DO [facility] 12/01/2015, 12:26 PM Pager [phone]  If 7PM-7AM, please contact night-coverage [email or web address] Password [email or web address]

## 2015-12-01 NOTE — Progress Notes (Signed)
OT Cancellation Note  Patient Details Name: Anthony Bullock MRN: [id number] DOB: January 25, 1960   Cancelled Treatment:    Reason Eval/Treat Not Completed: OT screened, no needs identified, will sign off  Pt is I with ADL activity   Kierston Plasencia, Thereasa Parkin 12/01/2015, 12:16 PM

## 2015-12-01 NOTE — Evaluation (Signed)
Physical Therapy Evaluation and Discharge Patient Details Name: Anthony Bullock MRN: [id number] DOB: 1960-02-09 Today's Date: 12/01/2015   History of Present Illness  Anthony Bullock a 55 y.o.maleextensive medical history including CHF, ESRD on HD MWF, last HD on 11/17 on transplant list , DM, CAD, s/p AICD, transferred from [facility] for continuation of care. He presented with respiratory distress requiring Bipap for O2 57% and now on 4 L O2.  Pt found to have occluded AV graft fistula and underwent IR angiojet and angioplasty removal 11/20.  Clinical Impression  Pt functioning at baseline and independently. Due to pt's gait impairment due to R TKA pt scored 16 on DGI however pt with no signs of instability or episodes of LOB. Pt with no further acute skilled PT needs at this time. PT SIGNING OFF. Please re-consult if needed in future.    Follow Up Recommendations No PT follow up    Equipment Recommendations  None recommended by PT    Recommendations for Other Services       Precautions / Restrictions Precautions Precautions: Other (comment) Precaution Comments: recent clot in R UE dialysis cathetor Restrictions Weight Bearing Restrictions: No      Mobility  Bed Mobility Overal bed mobility: Independent                Transfers Overall transfer level: Modified independent Equipment used: None             General transfer comment: pushed up from bed, no instability  Ambulation/Gait Ambulation/Gait assistance: Modified independent (Device/Increase time) Ambulation Distance (Feet): 200 Feet Assistive device: None Gait Pattern/deviations: Step-through pattern;Antalgic Gait velocity: dec Gait velocity interpretation: Below normal speed for age/gender General Gait Details: pt with previous R TKA with limited ROM and decreased strength presenting with mild antalgia but not signs of instability or episodes of LOB  Stairs Stairs: Yes Stairs assistance:  Min guard Stair Management: One rail Right;Alternating pattern;Step to pattern Number of Stairs: 12 General stair comments: ascended reciprocally and descended step to leading with R LE  Wheelchair Mobility    Modified Rankin (Stroke Patients Only)       Balance                                 Standardized Balance Assessment Standardized Balance Assessment : Dynamic Gait Index   Dynamic Gait Index Level Surface: Mild Impairment Change in Gait Speed: Mild Impairment Gait with Horizontal Head Turns: Mild Impairment Gait with Vertical Head Turns: Mild Impairment Gait and Pivot Turn: Mild Impairment Step Over Obstacle: Mild Impairment Step Around Obstacles: Mild Impairment Steps: Mild Impairment Total Score: 16       Pertinent Vitals/Pain Pain Assessment: 0-10 Pain Score: 2  Pain Location: R UE at fistula site Pain Descriptors / Indicators: Sore Pain Intervention(s): Monitored during session    Home Living Family/patient expects to be discharged to:: Private residence Living Arrangements: Spouse/significant other Available Help at Discharge: Family;Available 24 hours/day Type of Home: House Home Access: Ramped entrance     Home Layout: One level Home Equipment: None      Prior Function Level of Independence: Independent         Comments: goes to dialysis     Hand Dominance   Dominant Hand: Right    Extremity/Trunk Assessment   Upper Extremity Assessment: RUE deficits/detail RUE Deficits / Details: no MMT peformed due to procedure however pt with SHLD ROM wfl and  wrist, elbow slightly limited due to pain at dialysis site         Lower Extremity Assessment: Overall [facility] for tasks assessed      Cervical / Trunk Assessment: Normal  Communication   Communication: No difficulties  Cognition Arousal/Alertness: Awake/alert Behavior During Therapy: WFL for tasks assessed/performed Overall Cognitive Status: Within Functional Limits for  tasks assessed                      General Comments      Exercises     Assessment/Plan    PT Assessment Patent does not need any further PT services  PT Problem List            PT Treatment Interventions      PT Goals (Current goals can be found in the Care Plan section)  Acute Rehab PT Goals Patient Stated Goal: home asap PT Goal Formulation: All assessment and education complete, DC therapy    Frequency     Barriers to discharge        Co-evaluation               End of Session   Activity Tolerance: Patient tolerated treatment well Patient left: in chair;with call bell/phone within reach;with family/visitor present Nurse Communication: Mobility status         Time: 1610-9604 PT Time Calculation (min) (ACUTE ONLY): 10 min   Charges:   PT Evaluation $PT Eval Low Complexity: 1 Procedure     PT G Codes:        Janica Eldred M Tomeka Kantner 12/01/2015, 10:00 AM   Lewis Shock, PT, DPT Pager #: [phone] Office #: [phone]

## 2015-12-01 NOTE — Progress Notes (Signed)
Assessment: 1. Resp distress/ dyspnea/ pulm infiltrates improved with HD-  And volume removal.  Suggest new dry weight of 89Kg.  We will sign off. 2. ESRD HD started Jun 2017.                     3.   S/p RUA AVG , clotted s/p IR intervention 10/20  Subjective: Interval History: 4000cc off with HD yesterday.  Better.  Weight down to 89.7, had EDW of 93kg.  Objective: Vital signs in last 24 hours: Temp:  [97 F (36.1 C)-98 F (36.7 C)] 97.9 F (36.6 C) (11/21 0400) Pulse Rate:  [64-80] 80 (11/21 1105) Resp:  [14-20] 20 (11/21 0400) BP: (121-153)/(54-76) 133/54 (11/21 1105) SpO2:  [97 %-99 %] 99 % (11/21 0400) Weight:  [89.7 kg (197 lb 11.2 oz)-91.6 kg (201 lb 15.1 oz)] 89.7 kg (197 lb 11.2 oz) (11/21 0400) Weight change: -3.247 kg (-7 lb 2.5 oz)  Intake/Output from previous day: 11/20 0701 - 11/21 0700 In: 240 [P.O.:240] Out: 4100 [Urine:100] Intake/Output this shift: Total I/O In: -  Out: 60 [Urine:60]  General appearance: alert and cooperative Resp: rhonchi bilaterally Chest wall: no tenderness Extremities: edema 1+ edemaa, sutures at access site  Lab Results:  Recent Labs  11/29/15 0810 12/01/15 1007  WBC 9.7 9.3  HGB 9.0* 9.1*  HCT 27.5* 29.3*  PLT 156 198   BMET:  Recent Labs  11/30/15 0158 12/01/15 0155  NA 134* 135  K 3.7 3.5  CL 98* 97*  CO2 25 30  GLUCOSE 251* 200*  BUN 62* 33*  CREATININE 7.69* 5.09*  CALCIUM 7.7* 7.8*   No results for input(s): PTH in the last 72 hours. Iron Studies: No results for input(s): IRON, TIBC, TRANSFERRIN, FERRITIN in the last 72 hours. Studies/Results: Dg Chest 2 View  Result Date: 11/30/2015 CLINICAL DATA:  2-3 days of productive cough and shortness of breath. EXAM: CHEST  2 VIEW COMPARISON:  Portable chest x-ray of November 28, 2015 FINDINGS: The lungs are adequately inflated. The interstitial markings are coarse but have improved significantly since the study of 2 days ago. Heart remains top-normal in size. The  pulmonary vascularity is not engorged today. The ICD is in stable position. The mediastinum is normal in width. There is a a trace of pleural fluid blunting the costophrenic angles on the left. The observed bony thorax exhibits no acute abnormality. IMPRESSION: Marked improvement in pulmonary edema since the previous study. Persistent coarse infrahilar lung markings greatest on the right likely reflect atelectasis. Trace left pleural effusion. Electronically Signed   By: David  Martinique M.D.   On: 11/30/2015 07:44   Ir US Guide Vasc Access Right  Result Date: 11/30/2015 CLINICAL DATA:  Thrombosis of right upper arm dialysis graft. EXAM: 1. ULTRASOUND GUIDANCE FOR VASCULAR ACCESS OF DIALYSIS GRAFT. 2. DIALYSIS GRAFT DECLOT PROCEDURE WITH TWO SEPARATE GRAFT ACCESS SITES. 3. VENOUS ANGIOPLASTY OF DIALYSIS GRAFT VENOUS ANASTOMOSIS. ANESTHESIA/SEDATION: 2.0 mg IV Versed; 100 mcg IV Fentanyl. Total Moderate Sedation Time 52 minutes. CONTRAST:  27mL ISOVUE-300 IOPAMIDOL (ISOVUE-300) INJECTION 61% MEDICATIONS: 2 mg tPA, 3000 U IV heparin FLUOROSCOPY TIME:  3 minutes and 48 seconds.  21 mGy. PROCEDURE: The procedure, risks, benefits, and alternatives were explained to the patient. Questions regarding the procedure were encouraged and answered. The patient understands and consents to the procedure. A time-out was performed prior to initiating the procedure. The right upper arm dialysis graft was prepped with chlorhexidine in a sterile fashion, and a sterile  drape was applied covering the operative field. A sterile gown and sterile gloves were used for the procedure. Local anesthesia was provided with 1% Lidocaine. Preliminary ultrasound was performed of the right upper arm dialysis graft. Both antegrade and retrograde graft access was performed with micropuncture sets under direct ultrasound guidance. Ultrasound image documentation was performed. t-PA was instilled via each access. 7-French antegrade and 6-French  retrograde sheaths were placed. A diagnostic catheter was advanced and contrast injection performed at the level of patent venous outflow. Outflow venography was also performed via the catheter. Balloon angioplasty was performed at the level of the venous anastomosis with a 7 mm x 4 cm Conquest balloon. Mechanical thrombectomy was performed within the dialysis graft with the Angiojet device. Thrombectomy across the arterial anastomosis was then performed with a 4-French Fogarty balloon catheter. Several passes were made with the Fogarty catheter. Suction thrombectomy was then performed through the antegrade sheath. Graft patency was reassessed with angiography. Upon completion of the procedure, both sheaths were removed and hemostasis obtained with Ethilon pursestring sutures. COMPLICATIONS: None FINDINGS: Ultrasound confirms thrombosis of the graft. Etiology of thrombosis is a tight stenosis at the venous anastomosis of the graft with the axillary vein. After reestablishing flow in the graft and clearing thrombus, the anastomosis responded well to 7 mm balloon angioplasty with no significant residual stenosis remaining. Rapid flow was re-established in the graft. The arterial anastomosis is widely patent after thrombectomy. Venous outflow is normally patent including central veins in the chest. IMPRESSION: Successful declot procedure to reestablish flow in an occluded right upper arm dialysis graft. Etiology was stenosis at the venous anastomosis which responded well to 7 mm balloon angioplasty. ACCESS: The graft remains amenable to percutaneous intervention. Electronically Signed   By: Aletta Edouard M.D.   On: 11/30/2015 11:56   Ir Thrombectomy Av Fistula/w Thrombolysis/pta Inc Shunt/img Right  Result Date: 11/30/2015 CLINICAL DATA:  Thrombosis of right upper arm dialysis graft. EXAM: 1. ULTRASOUND GUIDANCE FOR VASCULAR ACCESS OF DIALYSIS GRAFT. 2. DIALYSIS GRAFT DECLOT PROCEDURE WITH TWO SEPARATE GRAFT  ACCESS SITES. 3. VENOUS ANGIOPLASTY OF DIALYSIS GRAFT VENOUS ANASTOMOSIS. ANESTHESIA/SEDATION: 2.0 mg IV Versed; 100 mcg IV Fentanyl. Total Moderate Sedation Time 52 minutes. CONTRAST:  22mL ISOVUE-300 IOPAMIDOL (ISOVUE-300) INJECTION 61% MEDICATIONS: 2 mg tPA, 3000 U IV heparin FLUOROSCOPY TIME:  3 minutes and 48 seconds.  21 mGy. PROCEDURE: The procedure, risks, benefits, and alternatives were explained to the patient. Questions regarding the procedure were encouraged and answered. The patient understands and consents to the procedure. A time-out was performed prior to initiating the procedure. The right upper arm dialysis graft was prepped with chlorhexidine in a sterile fashion, and a sterile drape was applied covering the operative field. A sterile gown and sterile gloves were used for the procedure. Local anesthesia was provided with 1% Lidocaine. Preliminary ultrasound was performed of the right upper arm dialysis graft. Both antegrade and retrograde graft access was performed with micropuncture sets under direct ultrasound guidance. Ultrasound image documentation was performed. t-PA was instilled via each access. 7-French antegrade and 6-French retrograde sheaths were placed. A diagnostic catheter was advanced and contrast injection performed at the level of patent venous outflow. Outflow venography was also performed via the catheter. Balloon angioplasty was performed at the level of the venous anastomosis with a 7 mm x 4 cm Conquest balloon. Mechanical thrombectomy was performed within the dialysis graft with the Angiojet device. Thrombectomy across the arterial anastomosis was then performed with a 4-French Fogarty balloon catheter. Several  passes were made with the Fogarty catheter. Suction thrombectomy was then performed through the antegrade sheath. Graft patency was reassessed with angiography. Upon completion of the procedure, both sheaths were removed and hemostasis obtained with Ethilon pursestring  sutures. COMPLICATIONS: None FINDINGS: Ultrasound confirms thrombosis of the graft. Etiology of thrombosis is a tight stenosis at the venous anastomosis of the graft with the axillary vein. After reestablishing flow in the graft and clearing thrombus, the anastomosis responded well to 7 mm balloon angioplasty with no significant residual stenosis remaining. Rapid flow was re-established in the graft. The arterial anastomosis is widely patent after thrombectomy. Venous outflow is normally patent including central veins in the chest. IMPRESSION: Successful declot procedure to reestablish flow in an occluded right upper arm dialysis graft. Etiology was stenosis at the venous anastomosis which responded well to 7 mm balloon angioplasty. ACCESS: The graft remains amenable to percutaneous intervention. Electronically Signed   By: Aletta Edouard M.D.   On: 11/30/2015 11:56    Scheduled: . aspirin EC  81 mg Oral Daily  . atorvastatin  80 mg Oral QHS  . azithromycin  500 mg Oral Daily  . carvedilol  12.5 mg Oral BID  . cefTRIAXone (ROCEPHIN)  IV  1 g Intravenous Q24H  . fenofibrate  160 mg Oral Daily  . furosemide  40 mg Intravenous BID  . gabapentin  300 mg Oral TID  . heparin  5,000 Units Subcutaneous Q8H  . insulin aspart  0-9 Units Subcutaneous TID WC  . insulin glargine  15 Units Subcutaneous QHS  . sodium chloride flush  3 mL Intravenous Q12H     LOS: 2 days   Kimmy Totten C 12/01/2015,12:22 PM

## 2015-12-01 NOTE — Care Management Note (Addendum)
Case Management Note Marvetta Gibbons RN, BSN Unit 2W-Case Manager [phone]  Patient Details  Name: Anthony Bullock MRN: [id number] Date of Birth: [birth year]  Subjective/Objective:  Pt admitted with occluded AV Graft Fistula. Underwent IR Angiojet and Angioplasty removal and then Hemodialysis on 11/20                   Action/Plan: PTA pt lived at home with spouse, hx of ESRD with HD on MWF, anticipate return home-CM to follow  Expected Discharge Date:    12/01/15              Expected Discharge Plan:  Home/Self Care  In-House Referral:     Discharge planning Services  CM Consult  Post Acute Care Choice:    Choice offered to:     DME Arranged:    DME Agency:     HH Arranged:    [place] Agency:     Status of Service:  Completed, signed off  If discussed at [employer] of Stay Meetings, dates discussed:    Additional Comments:  Dawayne Patricia, RN 12/01/2015, 10:33 AM

## 2015-12-02 ENCOUNTER — Telehealth: Payer: Self-pay

## 2015-12-02 NOTE — Telephone Encounter (Signed)
LMTCB

## 2015-12-04 LAB — CULTURE, BLOOD (ROUTINE X 2)
Culture: NO GROWTH
Culture: NO GROWTH

## 2015-12-07 NOTE — Telephone Encounter (Signed)
D/C 12/01/15 To: home  Spoke with pt and he states that he is doing well. He has no questions or concerns at this time.   Appt scheduled with Dr Elease Hashimoto 12/08/15. Pt aware.    Transition Care Management Follow-up Telephone Call  How have you been since you were released from the hospital? Great. Getting around well.    Do you understand why you were in the hospital? yes   Do you understand the discharge instrcutions? no  Items Reviewed:  Medications reviewed: yes  Allergies reviewed: yes  Dietary changes reviewed: yes  Referrals reviewed: yes   Functional Questionnaire:   Activities of Daily Living (ADLs):   He states they are independent in the following: ambulation, bathing and hygiene, feeding, continence, grooming, toileting and dressing States they require assistance with the following: none   Any transportation issues/concerns?: no   Any patient concerns? no   Confirmed importance and date/time of follow-up visits scheduled: yes   Confirmed with patient if condition begins to worsen call PCP or go to the ER.  Patient was given the Call-a-Nurse line [phone]: yes

## 2015-12-08 ENCOUNTER — Encounter: Payer: Self-pay | Admitting: Family Medicine

## 2015-12-08 ENCOUNTER — Ambulatory Visit (INDEPENDENT_AMBULATORY_CARE_PROVIDER_SITE_OTHER): Payer: BLUE CROSS/BLUE SHIELD | Admitting: Family Medicine

## 2015-12-08 VITALS — BP 110/60 | HR 88 | Temp 97.7°F | Ht 70.0 in | Wt 200.5 lb

## 2015-12-08 DIAGNOSIS — N186 End stage renal disease: Secondary | ICD-10-CM | POA: Diagnosis not present

## 2015-12-08 DIAGNOSIS — I5022 Chronic systolic (congestive) heart failure: Secondary | ICD-10-CM | POA: Diagnosis not present

## 2015-12-08 DIAGNOSIS — I10 Essential (primary) hypertension: Secondary | ICD-10-CM | POA: Diagnosis not present

## 2015-12-08 DIAGNOSIS — E785 Hyperlipidemia, unspecified: Secondary | ICD-10-CM

## 2015-12-08 DIAGNOSIS — Z992 Dependence on renal dialysis: Secondary | ICD-10-CM

## 2015-12-08 DIAGNOSIS — E1129 Type 2 diabetes mellitus with other diabetic kidney complication: Secondary | ICD-10-CM

## 2015-12-08 DIAGNOSIS — E1165 Type 2 diabetes mellitus with hyperglycemia: Secondary | ICD-10-CM

## 2015-12-08 MED ORDER — GABAPENTIN 300 MG PO CAPS: 300.0000 mg | ORAL_CAPSULE | Freq: Three times a day (TID) | ORAL | 3 refills | Status: DC

## 2015-12-08 MED ORDER — MUPIROCIN 2 % EX OINT: 1.0000 "application " | TOPICAL_OINTMENT | Freq: Two times a day (BID) | CUTANEOUS | 2 refills | Status: DC | PRN

## 2015-12-08 NOTE — Progress Notes (Signed)
Subjective:     Patient ID: Anthony Bullock, male   DOB: 07/16/1960, 55 y.o.   MRN: [id number]  HPI Patient has complex past medical history and is seen following recent hospitalization and multiple emergency room visits. Past medical history significant for CAD, systolic heart failure, hypertension, peripheral vascular disease, poorly controlled type 2 diabetes with neuropathy and renal complications, end-stage renal disease on dialysis, history of kidney stones, chronic anemia, dyslipidemia. He is followed by nephrology and gets dialysis every Monday, Wednesday, and Friday. Wife states that he was eating a considerable amount of ham recently and apparently became volume overloaded. He had presented to [facility] in respiratory distress with O2 sat 57%. He was placed on BiPAP. He was transferred to [facility] for further evaluation and management and found to have occluded AV graft fistula. He underwent interventional radiology Angiojet and angioplasty removal of the occlusion and has resumed his dialysis.  Patient had significant volume overload and also questionable pneumonia/bronchitis on admission. His BNP level was over 2600. Patient was treated with ceftriaxone and azithromycin. Continued on doxycycline as an outpatient. He feels back to baseline at this time. He has not had any fever. His breathing has been at baseline. No orthopnea. No peripheral edema.  Type 2 diabetes with long history of poor compliance. Currently takes Lantus 40 units once daily and is on sliding scale with Humalog. No recent hypoglycemia. Recent hemoglobin A1c 8.1%. Both patient and his wife have made some dietary changes have been more strict with watching sodium and is trying to stay within guidelines of 1500 mg per day or less of sodium  Past Medical History:  Diagnosis Date  . 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 Allergic rhinitis   . Anemia   . Arthritis   . Chronic systolic  heart failure ([place])    a. ECHO (12/2012) EF 25-30%, HK entireanteroseptal myocardium //  b.  EF 25%, diffuse HK, grade 1 diastolic dysfunction, MAC, mild LAE, normal RVSF, trivial pericardial effusion  . COPD (chronic obstructive pulmonary disease) ([place])   . Diabetes mellitus type II   . Diabetic nephropathy ([place])   . Diabetic neuropathy ([place])   . ESRD on hemodialysis [facility])    started HD June 2017, goes to [facility] HD unit, Dr Hinda Lenis  . History of cardiac catheterization    a.Myoview 1/15:  There is significant left ventricular dysfunction. There may be slight scar at the apex. There is no significant ischemia. LV Ejection Fraction: 27%  //  b. RHC/LHC (1/15) with mean RA 6, PA 47/22 mean 33, mean PCWP 20, PVR 2.5 WU, CI 2.5; 80% dLAD stenosis, 70% diffuse large D.   . Hyperlipidemia   . Hypertension   . Kidney stones   . NICM (nonischemic cardiomyopathy) ([place])    Primarily nonischemic. Echo (12/14) with EF 25-30%. Echo (3/15) with EF 25%, mild to moderately dilated LV, normal RV size and systolic function.   . Pneumonia   . Urethral stricture    Past Surgical History:  Procedure Laterality Date  . AV FISTULA PLACEMENT Right 09/08/2015   Procedure: INSERTION OF 4-94mm x 45cm  ARTERIOVENOUS (AV) GORE-TEX GRAFT RIGHT UPPER  ARM;  Surgeon: Angelia Mould, MD;  Location: [place];  Service: Vascular;  Laterality: Right;  . BASCILIC VEIN TRANSPOSITION Right 08/22/2014   Procedure: RIGHT UPPER ARM BASCILIC VEIN TRANSPOSITION;  Surgeon: Angelia Mould, MD;  Location: [place];  Service: Vascular;  Laterality: Right;  . CARDIAC  CATHETERIZATION    . CARDIAC DEFIBRILLATOR PLACEMENT  06/27/2013   Sub Q       BY DR Caryl Comes  . COLONOSCOPY WITH PROPOFOL N/A 07/22/2015   Procedure: COLONOSCOPY WITH PROPOFOL;  Surgeon: Doran Stabler, MD;  Location: [facility] END[facility];  Service: Gastroenterology;  Laterality: N/A;  . HERNIA REPAIR    . IMPLANTABLE CARDIOVERTER DEFIBRILLATOR IMPLANT N/A 06/27/2013    Procedure: SUB Q ICD;  Surgeon: Deboraha Sprang, MD;  Location: [facility] CATH LAB;  Service: Cardiovascular;  Laterality: N/A;  . IR GENERIC HISTORICAL Right 11/30/2015   IR THROMBECTOMY AV FISTULA W/THROMBOLYSIS/PTA INC/SHUNT/IMG RIGHT 11/30/2015 Aletta Edouard, MD [facility]-INTERV RAD  . IR GENERIC HISTORICAL  11/30/2015   IR US GUIDE VASC ACCESS RIGHT 11/30/2015 Aletta Edouard, MD [facility]INTER[facility]  . LEFT A ND RIGHT HEART CATH  01/30/2013   DR Sung Amabile  . LEFT AND RIGHT HEART CATHETERIZATION WITH CORONARY ANGIOGRAM N/A 01/30/2013   Procedure: LEFT AND RIGHT HEART CATHETERIZATION WITH CORONARY ANGIOGRAM;  Surgeon: Jolaine Artist, MD;  Location: [facility] C[facility] LAB;  Service: Cardiovascular;  Laterality: N/A;  . PERIPHERAL VASCULAR CATHETERIZATION Right 01/26/2015   Procedure: A/V Fistulagram;  Surgeon: Angelia Mould, MD;  Location: [place] CV LAB;  Service: Cardiovascular;  Laterality: Right;  . reapea urethral surgery for recurrent obstruction  2011  . TOTAL KNEE ARTHROPLASTY Right 2007    reports that he quit smoking about 5 years ago. His smoking use included Cigarettes. He has a 64.00 pack-year smoking history. He has never used smokeless tobacco. He reports that he does not drink alcohol or use drugs. family history includes Alcohol abuse in his father; Bladder Cancer in his mother; Diabetes in his maternal grandmother; Heart Problems in his maternal grandmother; Heart disease in his maternal grandfather; Melanoma in his father; Prostate cancer in his maternal grandfather; Stroke in his maternal grandmother. Allergies  Allergen Reactions  . Morphine Sulfate Rash and Other (See Comments)    REACTION: Itches all over, red spots     Review of Systems  Constitutional: Negative for chills, fatigue and fever.  Eyes: Negative for visual disturbance.  Respiratory: Negative for cough, chest tightness and shortness of breath.   Cardiovascular: Negative for chest pain, palpitations and leg swelling.   Gastrointestinal: Negative for abdominal pain, nausea and vomiting.  Genitourinary: Negative for dysuria.  Musculoskeletal: Negative for back pain.  Skin: Negative for rash.  Neurological: Negative for dizziness, syncope, weakness, light-headedness and headaches.  Psychiatric/Behavioral: Negative for confusion.       Objective:   Physical Exam  Constitutional: He is oriented to person, place, and time. He appears well-developed and well-nourished.  Neck: Neck supple.  Cardiovascular: Normal rate and regular rhythm.   Pulmonary/Chest: Effort normal and breath sounds normal. No respiratory distress. He has no wheezes. He has no rales.  Musculoskeletal: He exhibits no edema.  Neurological: He is alert and oriented to person, place, and time. No cranial nerve deficit.  Psychiatric: He has a normal mood and affect.       Assessment:     #1 chronic systolic heart failure with recent volume overload  #2 end-stage renal disease on hemodialysis  #3 long-standing history of poorly controlled type 2 diabetes with history of poor compliance and complications of diabetic peripheral neuropathy and nephropathy  #4 history of hypertension with blood pressure currently well controlled  #5 hyperlipidemia    Plan:     -Continue close follow-up with nephrology -Flu vaccine already given -He is scheduled already  for follow-up labs later in the week with nephrology so we deferred getting any lab work today -Recent A1c less than 2 weeks ago 8.1% through hospital. Continue current insulin regimen -Follow-up immediately for any fever, increased shortness of breath, or other concerns -refills of Gabapentin given  [name] MD [facility]

## 2015-12-08 NOTE — Progress Notes (Signed)
Pre visit review using our clinic review tool, if applicable. No additional management support is needed unless otherwise documented below in the visit note. 

## 2015-12-14 ENCOUNTER — Other Ambulatory Visit (HOSPITAL_COMMUNITY): Payer: Self-pay | Admitting: Nephrology

## 2015-12-14 ENCOUNTER — Other Ambulatory Visit: Payer: Self-pay | Admitting: Radiology

## 2015-12-14 DIAGNOSIS — N186 End stage renal disease: Secondary | ICD-10-CM

## 2015-12-15 ENCOUNTER — Ambulatory Visit (HOSPITAL_COMMUNITY)
Admission: RE | Admit: 2015-12-15 | Discharge: 2015-12-15 | Disposition: A | Discharge status: Home / Self Care | Payer: BLUE CROSS/BLUE SHIELD | Source: Ambulatory Visit | Attending: Nephrology | Admitting: Nephrology

## 2015-12-15 ENCOUNTER — Other Ambulatory Visit (HOSPITAL_COMMUNITY): Payer: Self-pay | Admitting: Nephrology

## 2015-12-15 ENCOUNTER — Encounter (HOSPITAL_COMMUNITY): Payer: Self-pay

## 2015-12-15 DIAGNOSIS — E785 Hyperlipidemia, unspecified: Secondary | ICD-10-CM | POA: Insufficient documentation

## 2015-12-15 DIAGNOSIS — Z794 Long term (current) use of insulin: Secondary | ICD-10-CM | POA: Diagnosis not present

## 2015-12-15 DIAGNOSIS — T82858A Stenosis of vascular prosthetic devices, implants and grafts, initial encounter: Secondary | ICD-10-CM | POA: Diagnosis not present

## 2015-12-15 DIAGNOSIS — Z95810 Presence of automatic (implantable) cardiac defibrillator: Secondary | ICD-10-CM | POA: Insufficient documentation

## 2015-12-15 DIAGNOSIS — Z7982 Long term (current) use of aspirin: Secondary | ICD-10-CM | POA: Insufficient documentation

## 2015-12-15 DIAGNOSIS — T82868A Thrombosis of vascular prosthetic devices, implants and grafts, initial encounter: Secondary | ICD-10-CM | POA: Insufficient documentation

## 2015-12-15 DIAGNOSIS — Z5181 Encounter for therapeutic drug level monitoring: Secondary | ICD-10-CM | POA: Insufficient documentation

## 2015-12-15 DIAGNOSIS — I5022 Chronic systolic (congestive) heart failure: Secondary | ICD-10-CM | POA: Insufficient documentation

## 2015-12-15 DIAGNOSIS — E1140 Type 2 diabetes mellitus with diabetic neuropathy, unspecified: Secondary | ICD-10-CM | POA: Insufficient documentation

## 2015-12-15 DIAGNOSIS — Z79899 Other long term (current) drug therapy: Secondary | ICD-10-CM | POA: Insufficient documentation

## 2015-12-15 DIAGNOSIS — N186 End stage renal disease: Secondary | ICD-10-CM | POA: Diagnosis not present

## 2015-12-15 DIAGNOSIS — Z87891 Personal history of nicotine dependence: Secondary | ICD-10-CM | POA: Diagnosis not present

## 2015-12-15 DIAGNOSIS — Z992 Dependence on renal dialysis: Secondary | ICD-10-CM | POA: Insufficient documentation

## 2015-12-15 DIAGNOSIS — I428 Other cardiomyopathies: Secondary | ICD-10-CM | POA: Insufficient documentation

## 2015-12-15 DIAGNOSIS — E1122 Type 2 diabetes mellitus with diabetic chronic kidney disease: Secondary | ICD-10-CM | POA: Insufficient documentation

## 2015-12-15 DIAGNOSIS — J449 Chronic obstructive pulmonary disease, unspecified: Secondary | ICD-10-CM | POA: Diagnosis not present

## 2015-12-15 DIAGNOSIS — I132 Hypertensive heart and chronic kidney disease with heart failure and with stage 5 chronic kidney disease, or end stage renal disease: Secondary | ICD-10-CM | POA: Insufficient documentation

## 2015-12-15 DIAGNOSIS — Y832 Surgical operation with anastomosis, bypass or graft as the cause of abnormal reaction of the patient, or of later complication, without mention of misadventure at the time of the procedure: Secondary | ICD-10-CM | POA: Diagnosis not present

## 2015-12-15 DIAGNOSIS — E1121 Type 2 diabetes mellitus with diabetic nephropathy: Secondary | ICD-10-CM | POA: Insufficient documentation

## 2015-12-15 HISTORY — PX: IR GENERIC HISTORICAL: IMG1180011

## 2015-12-15 LAB — COMPREHENSIVE METABOLIC PANEL
ALT: 17 U/L (ref 17–63)
AST: 19 U/L (ref 15–41)
Albumin: 3.2 g/dL — ABNORMAL LOW (ref 3.5–5.0)
Alkaline Phosphatase: 67 U/L (ref 38–126)
Anion gap: 9 (ref 5–15)
BUN: 58 mg/dL — ABNORMAL HIGH (ref 6–20)
CO2: 27 mmol/L (ref 22–32)
Calcium: 9 mg/dL (ref 8.9–10.3)
Chloride: 96 mmol/L — ABNORMAL LOW (ref 101–111)
Creatinine, Ser: 10.4 mg/dL — ABNORMAL HIGH (ref 0.61–1.24)
GFR calc Af Amer: 6 mL/min — ABNORMAL LOW (ref >60)
GFR calc non Af Amer: 5 mL/min — ABNORMAL LOW (ref >60)
Glucose, Bld: 214 mg/dL — ABNORMAL HIGH (ref 65–99)
Potassium: 5.5 mmol/L — ABNORMAL HIGH (ref 3.5–5.1)
Sodium: 132 mmol/L — ABNORMAL LOW (ref 135–145)
Total Bilirubin: 0.6 mg/dL (ref 0.3–1.2)
Total Protein: 6.6 g/dL (ref 6.5–8.1)

## 2015-12-15 LAB — APTT: aPTT: 29 seconds (ref 24–36)

## 2015-12-15 LAB — PROTIME-INR
INR: 1.03
Prothrombin Time: 13.5 seconds (ref 11.4–15.2)

## 2015-12-15 LAB — CBC
HCT: 33 % — ABNORMAL LOW (ref 39.0–52.0)
Hemoglobin: 10.9 g/dL — ABNORMAL LOW (ref 13.0–17.0)
MCH: 27 pg (ref 26.0–34.0)
MCHC: 33 g/dL (ref 30.0–36.0)
MCV: 81.9 fL (ref 78.0–100.0)
Platelets: 204 10*3/uL (ref 150–400)
RBC: 4.03 MIL/uL — ABNORMAL LOW (ref 4.22–5.81)
RDW: 15.8 % — ABNORMAL HIGH (ref 11.5–15.5)
WBC: 5.8 10*3/uL (ref 4.0–10.5)

## 2015-12-15 LAB — GLUCOSE, CAPILLARY: Glucose-Capillary: 202 mg/dL — ABNORMAL HIGH (ref 65–99)

## 2015-12-15 MED ORDER — FENTANYL CITRATE (PF) 100 MCG/2ML IJ SOLN
INTRAMUSCULAR | Status: AC | PRN
  Administered 2015-12-15 (×2): 50 ug via INTRAVENOUS

## 2015-12-15 MED ORDER — ALTEPLASE 2 MG IJ SOLR
INTRAMUSCULAR | Status: AC
  Filled 2015-12-15: qty 2

## 2015-12-15 MED ORDER — MIDAZOLAM HCL 2 MG/2ML IJ SOLN
INTRAMUSCULAR | Status: AC
  Filled 2015-12-15: qty 2

## 2015-12-15 MED ORDER — ALTEPLASE 2 MG IJ SOLR
INTRAMUSCULAR | Status: AC | PRN
  Administered 2015-12-15: 2 mg

## 2015-12-15 MED ORDER — HEPARIN SODIUM (PORCINE) 1000 UNIT/ML IJ SOLN
INTRAMUSCULAR | Status: AC | PRN
  Administered 2015-12-15: 3000 [IU] via INTRAVENOUS

## 2015-12-15 MED ORDER — LIDOCAINE HCL 1 % IJ SOLN
INTRAMUSCULAR | Status: AC
Start: 2015-12-15 — End: 2015-12-15
  Filled 2015-12-15: qty 20

## 2015-12-15 MED ORDER — FENTANYL CITRATE (PF) 100 MCG/2ML IJ SOLN
INTRAMUSCULAR | Status: AC
  Filled 2015-12-15: qty 2

## 2015-12-15 MED ORDER — IOPAMIDOL (ISOVUE-300) INJECTION 61%
INTRAVENOUS | Status: AC
  Administered 2015-12-15: 50 mL
  Filled 2015-12-15: qty 100

## 2015-12-15 MED ORDER — HEPARIN SODIUM (PORCINE) 1000 UNIT/ML IJ SOLN
INTRAMUSCULAR | Status: AC
  Filled 2015-12-15: qty 1

## 2015-12-15 MED ORDER — MIDAZOLAM HCL 2 MG/2ML IJ SOLN
INTRAMUSCULAR | Status: AC | PRN
  Administered 2015-12-15: 1 mg via INTRAVENOUS
  Administered 2015-12-15: 2 mg via INTRAVENOUS

## 2015-12-15 MED ORDER — SODIUM CHLORIDE 0.9 % IV SOLN: INTRAVENOUS | Status: DC

## 2015-12-15 NOTE — Sedation Documentation (Signed)
Patient is resting comfortably. 

## 2015-12-15 NOTE — H&P (Signed)
Chief Complaint: Patient was seen in consultation today for right arm dialysis graft declot at the request of [place]  Referring Physician(s): Fran Lowes  Supervising Physician: Arne Cleveland  Patient Status: [facility] - Out-pt  History of Present Illness: Anthony Bullock is a 55 y.o. male   ESRD Right upper arm dialysis graft in use since 08/2015 Declot procedure of this same graft 11/30/2015: successful - Dr [name]  Has been using graft successfully until just Fri night 12/1 Dialysis session was fine that day; by that evening could not feel pulse or thrill. Mon at dialysis RN checked graft---Clotted  Scheduled now for right upper arm dialysis graft thrombolysis with possible angioplasty/stent placement. Possible tunneled dialysis catheter if needed.   Past Medical History:  Diagnosis Date  . 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 Allergic rhinitis   . Anemia   . Arthritis   . Chronic systolic heart failure ([place])    a. ECHO (12/2012) EF 25-30%, HK entireanteroseptal myocardium //  b.  EF 25%, diffuse HK, grade 1 diastolic dysfunction, MAC, mild LAE, normal RVSF, trivial pericardial effusion  . COPD (chronic obstructive pulmonary disease) ([place])   . Diabetes mellitus type II   . Diabetic nephropathy ([place])   . Diabetic neuropathy ([place])   . ESRD on hemodialysis [facility])    started HD June 2017, goes to [facility] HD unit, Dr Hinda Lenis  . History of cardiac catheterization    a.Myoview 1/15:  There is significant left ventricular dysfunction. There may be slight scar at the apex. There is no significant ischemia. LV Ejection Fraction: 27%  //  b. RHC/LHC (1/15) with mean RA 6, PA 47/22 mean 33, mean PCWP 20, PVR 2.5 WU, CI 2.5; 80% dLAD stenosis, 70% diffuse large D.   . Hyperlipidemia   . Hypertension   . Kidney stones   . NICM (nonischemic cardiomyopathy) ([place])    Primarily nonischemic. Echo (12/14) with EF 25-30%.  Echo (3/15) with EF 25%, mild to moderately dilated LV, normal RV size and systolic function.   . Pneumonia   . Urethral stricture     Past Surgical History:  Procedure Laterality Date  . AV FISTULA PLACEMENT Right 09/08/2015   Procedure: INSERTION OF 4-48mm x 45cm  ARTERIOVENOUS (AV) GORE-TEX GRAFT RIGHT UPPER  ARM;  Surgeon: Angelia Mould, MD;  Location: [place];  Service: Vascular;  Laterality: Right;  . BASCILIC VEIN TRANSPOSITION Right 08/22/2014   Procedure: RIGHT UPPER ARM BASCILIC VEIN TRANSPOSITION;  Surgeon: Angelia Mould, MD;  Location: [place];  Service: Vascular;  Laterality: Right;  . CARDIAC CATHETERIZATION    . CARDIAC DEFIBRILLATOR PLACEMENT  06/27/2013   Sub Q       BY DR Caryl Comes  . COLONOSCOPY WITH PROPOFOL N/A 07/22/2015   Procedure: COLONOSCOPY WITH PROPOFOL;  Surgeon: Doran Stabler, MD;  Location: [facility];  Service: Gastroenterology;  Laterality: N/A;  . HERNIA REPAIR    . IMPLANTABLE CARDIOVERTER DEFIBRILLATOR IMPLANT N/A 06/27/2013   Procedure: SUB Q ICD;  Surgeon: Deboraha Sprang, MD;  Location: [facility] CATH LAB;  Service: Cardiovascular;  Laterality: N/A;  . IR GENERIC HISTORICAL Right 11/30/2015   IR THROMBECTOMY AV FISTULA W/THROMBOLYSIS/PTA INC/SHUNT/IMG RIGHT 11/30/2015 Aletta Edouard, MD [facility]-INTERV RAD  . IR GENERIC HISTORICAL  11/30/2015   IR US GUIDE VASC ACCESS RIGHT 11/30/2015 Aletta Edouard, MD [facility]-INTERV RAD  . LEFT A ND RIGHT HEART CATH  01/30/2013   DR Sung Amabile  . LEFT AND  RIGHT HEART CATHETERIZATION WITH CORONARY ANGIOGRAM N/A 01/30/2013   Procedure: LEFT AND RIGHT HEART CATHETERIZATION WITH CORONARY ANGIOGRAM;  Surgeon: Jolaine Artist, MD;  Location: [facility] CATH LAB;  Service: Cardiovascular;  Laterality: N/A;  . PERIPHERAL VASCULAR CATHETERIZATION Right 01/26/2015   Procedure: A/V Fistulagram;  Surgeon: Angelia Mould, MD;  Location: [place] CV LAB;  Service: Cardiovascular;  Laterality: Right;  . reapea urethral surgery for  recurrent obstruction  2011  . TOTAL KNEE ARTHROPLASTY Right 2007    Allergies: Morphine sulfate  Medications: Prior to Admission medications   Medication Sig Start Date End Date Taking? Authorizing Provider  aspirin EC 81 MG tablet Take 81 mg by mouth daily.    Yes Historical Provider, MD  atorvastatin (LIPITOR) 80 MG tablet Take 1 tablet (80 mg total) by mouth at bedtime. 09/29/14  Yes [name], MD  azelastine (ASTELIN) 0.1 % nasal spray Place 2 sprays into both nostrils 3 (three) times daily as needed for rhinitis. Use in each nostril as directed 03/10/14  Yes [name], MD  BD PEN NEEDLE NANO U/F 32G X 4 MM MISC USE ONCE DAILY 04/03/15  Yes [name], MD  carvedilol (COREG) 12.5 MG tablet Take 1 tablet (12.5 mg total) by mouth 2 (two) times daily with a meal. 12/01/15  Yes Warden, DO  fenofibrate 160 MG tablet Take 1 tablet (160 mg total) by mouth daily. 09/29/14  Yes [name], MD  fexofenadine (ALLEGRA) 180 MG tablet Take 180 mg by mouth daily.   Yes Historical Provider, MD  gabapentin (NEURONTIN) 300 MG capsule Take 1 capsule (300 mg total) by mouth 3 (three) times daily. 12/08/15  Yes [name], MD  lanthanum (FOSRENOL) 1000 MG chewable tablet Chew 1,000 mg by mouth. Take one tablet after each meal.   Yes Historical Provider, MD  mupirocin ointment (BACTROBAN) 2 % Place 1 application into the nose 2 (two) times daily as needed. 12/08/15  Yes [name], MD  Olopatadine HCl (PATADAY) 0.2 % SOLN Place 1 drop into both eyes daily as needed (for allergies).    Yes Historical Provider, MD  glucose blood test strip Check 1 time daily. E11.9 One Touch Ultra Blue Test Strips 03/10/14   [name], MD  HYDROcodone-acetaminophen (NORCO/VICODIN) 5-325 MG tablet Take 1 tablet by mouth every 6 (six) hours as needed for moderate pain. 09/08/15   Samantha J Rhyne, PA-C  Insulin Glargine (LANTUS SOLOSTAR) 100 UNIT/ML Solostar Pen Inject 40  Units into the skin daily at 10 pm.     Historical Provider, MD  insulin lispro (HUMALOG) 100 UNIT/ML injection Inject 0-14 Units into the skin 3 (three) times daily as needed for high blood sugar (per sliding scale).    Historical Provider, MD     Family History  Problem Relation Age of Onset  . Bladder Cancer Mother   . Alcohol abuse Father   . Melanoma Father   . Stroke Maternal Grandmother   . Heart Problems Maternal Grandmother     unknown  . Diabetes Maternal Grandmother   . Heart disease Maternal Grandfather   . Prostate cancer Maternal Grandfather     Social History   Social History  . Marital status: Married    Spouse name: N/A  . Number of children: 0  . Years of education: N/A   Social History Main Topics  . Smoking status: Former Smoker    Packs/day: 2.00    Years: 32.00  Types: Cigarettes    Quit date: 05/11/2010  . Smokeless tobacco: Never Used  . Alcohol use No  . Drug use: No  . Sexual activity: Not Asked   Other Topics Concern  . 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  All other systems are negative.  Review of Systems  Constitutional: Negative for activity change, appetite change and fatigue.  Respiratory: Negative for shortness of breath.   Cardiovascular: Negative for chest pain.  Gastrointestinal: Negative for abdominal pain.  Psychiatric/Behavioral: Negative for behavioral problems and confusion.    Vital Signs: BP (!) 159/75   Pulse 81   Temp 98.1 F (36.7 C) (Oral)   SpO2 100%   Physical Exam  Constitutional: He is oriented to person, place, and time. He appears well-nourished.  Cardiovascular: Normal rate, regular rhythm and normal heart sounds.   Pulmonary/Chest: Effort normal and breath sounds normal.  Abdominal: Soft. Bowel sounds are normal. There is no tenderness.  Musculoskeletal: Normal range  of motion.  RUA dialysis graft No thrill No pulse  Neurological: He is alert and oriented to person, place, and time.  Skin: Skin is warm and dry.  Psychiatric: He has a normal mood and affect. His behavior is normal. Judgment and thought content normal.  Nursing note and vitals reviewed.   Mallampati Score:  MD Evaluation Airway: WNL Heart: WNL Abdomen: WNL Chest/ Lungs: WNL ASA  Classification: 3 Mallampati/Airway Score: One  Imaging: Dg Chest 2 View  Result Date: 11/30/2015 CLINICAL DATA:  2-3 days of productive cough and shortness of breath. EXAM: CHEST  2 VIEW COMPARISON:  Portable chest x-ray of November 28, 2015 FINDINGS: The lungs are adequately inflated. The interstitial markings are coarse but have improved significantly since the study of 2 days ago. Heart remains top-normal in size. The pulmonary vascularity is not engorged today. The ICD is in stable position. The mediastinum is normal in width. There is a a trace of pleural fluid blunting the costophrenic angles on the left. The observed bony thorax exhibits no acute abnormality. IMPRESSION: Marked improvement in pulmonary edema since the previous study. Persistent coarse infrahilar lung markings greatest on the right likely reflect atelectasis. Trace left pleural effusion. Electronically Signed   By: David  Martinique M.D.   On: 11/30/2015 07:44   Ir US Guide Vasc Access Right  Result Date: 11/30/2015 CLINICAL DATA:  Thrombosis of right upper arm dialysis graft. EXAM: 1. ULTRASOUND GUIDANCE FOR VASCULAR ACCESS OF DIALYSIS GRAFT. 2. DIALYSIS GRAFT DECLOT PROCEDURE WITH TWO SEPARATE GRAFT ACCESS SITES. 3. VENOUS ANGIOPLASTY OF DIALYSIS GRAFT VENOUS ANASTOMOSIS. ANESTHESIA/SEDATION: 2.0 mg IV Versed; 100 mcg IV Fentanyl. Total Moderate Sedation Time 52 minutes. CONTRAST:  54mL ISOVUE-300 IOPAMIDOL (ISOVUE-300) INJECTION 61% MEDICATIONS: 2 mg tPA, 3000 U IV heparin FLUOROSCOPY TIME:  3 minutes and 48 seconds.  21 mGy. PROCEDURE:  The procedure, risks, benefits, and alternatives were explained to the patient. Questions regarding the procedure were encouraged and answered. The patient understands and consents to the procedure. A time-out was performed prior to initiating the procedure. The right upper arm dialysis graft was prepped with chlorhexidine in a sterile fashion, and a sterile drape was applied covering the operative field. A sterile gown and sterile gloves were used for the procedure. Local anesthesia was provided with 1% Lidocaine. Preliminary ultrasound was performed of the right upper arm dialysis  graft. Both antegrade and retrograde graft access was performed with micropuncture sets under direct ultrasound guidance. Ultrasound image documentation was performed. t-PA was instilled via each access. 7-French antegrade and 6-French retrograde sheaths were placed. A diagnostic catheter was advanced and contrast injection performed at the level of patent venous outflow. Outflow venography was also performed via the catheter. Balloon angioplasty was performed at the level of the venous anastomosis with a 7 mm x 4 cm Conquest balloon. Mechanical thrombectomy was performed within the dialysis graft with the Angiojet device. Thrombectomy across the arterial anastomosis was then performed with a 4-French Fogarty balloon catheter. Several passes were made with the Fogarty catheter. Suction thrombectomy was then performed through the antegrade sheath. Graft patency was reassessed with angiography. Upon completion of the procedure, both sheaths were removed and hemostasis obtained with Ethilon pursestring sutures. COMPLICATIONS: None FINDINGS: Ultrasound confirms thrombosis of the graft. Etiology of thrombosis is a tight stenosis at the venous anastomosis of the graft with the axillary vein. After reestablishing flow in the graft and clearing thrombus, the anastomosis responded well to 7 mm balloon angioplasty with no significant residual  stenosis remaining. Rapid flow was re-established in the graft. The arterial anastomosis is widely patent after thrombectomy. Venous outflow is normally patent including central veins in the chest. IMPRESSION: Successful declot procedure to reestablish flow in an occluded right upper arm dialysis graft. Etiology was stenosis at the venous anastomosis which responded well to 7 mm balloon angioplasty. ACCESS: The graft remains amenable to percutaneous intervention. Electronically Signed   By: Aletta Edouard M.D.   On: 11/30/2015 11:56   Ir Thrombectomy Av Fistula/w Thrombolysis/pta Inc Shunt/img Right  Result Date: 11/30/2015 CLINICAL DATA:  Thrombosis of right upper arm dialysis graft. EXAM: 1. ULTRASOUND GUIDANCE FOR VASCULAR ACCESS OF DIALYSIS GRAFT. 2. DIALYSIS GRAFT DECLOT PROCEDURE WITH TWO SEPARATE GRAFT ACCESS SITES. 3. VENOUS ANGIOPLASTY OF DIALYSIS GRAFT VENOUS ANASTOMOSIS. ANESTHESIA/SEDATION: 2.0 mg IV Versed; 100 mcg IV Fentanyl. Total Moderate Sedation Time 52 minutes. CONTRAST:  68mL ISOVUE-300 IOPAMIDOL (ISOVUE-300) INJECTION 61% MEDICATIONS: 2 mg tPA, 3000 U IV heparin FLUOROSCOPY TIME:  3 minutes and 48 seconds.  21 mGy. PROCEDURE: The procedure, risks, benefits, and alternatives were explained to the patient. Questions regarding the procedure were encouraged and answered. The patient understands and consents to the procedure. A time-out was performed prior to initiating the procedure. The right upper arm dialysis graft was prepped with chlorhexidine in a sterile fashion, and a sterile drape was applied covering the operative field. A sterile gown and sterile gloves were used for the procedure. Local anesthesia was provided with 1% Lidocaine. Preliminary ultrasound was performed of the right upper arm dialysis graft. Both antegrade and retrograde graft access was performed with micropuncture sets under direct ultrasound guidance. Ultrasound image documentation was performed. t-PA was instilled  via each access. 7-French antegrade and 6-French retrograde sheaths were placed. A diagnostic catheter was advanced and contrast injection performed at the level of patent venous outflow. Outflow venography was also performed via the catheter. Balloon angioplasty was performed at the level of the venous anastomosis with a 7 mm x 4 cm Conquest balloon. Mechanical thrombectomy was performed within the dialysis graft with the Angiojet device. Thrombectomy across the arterial anastomosis was then performed with a 4-French Fogarty balloon catheter. Several passes were made with the Fogarty catheter. Suction thrombectomy was then performed through the antegrade sheath. Graft patency was reassessed with angiography. Upon completion of the procedure, both sheaths were removed and hemostasis obtained with  Ethilon pursestring sutures. COMPLICATIONS: None FINDINGS: Ultrasound confirms thrombosis of the graft. Etiology of thrombosis is a tight stenosis at the venous anastomosis of the graft with the axillary vein. After reestablishing flow in the graft and clearing thrombus, the anastomosis responded well to 7 mm balloon angioplasty with no significant residual stenosis remaining. Rapid flow was re-established in the graft. The arterial anastomosis is widely patent after thrombectomy. Venous outflow is normally patent including central veins in the chest. IMPRESSION: Successful declot procedure to reestablish flow in an occluded right upper arm dialysis graft. Etiology was stenosis at the venous anastomosis which responded well to 7 mm balloon angioplasty. ACCESS: The graft remains amenable to percutaneous intervention. Electronically Signed   By: Aletta Edouard M.D.   On: 11/30/2015 11:56    Labs:  CBC:  Recent Labs  09/08/15 0636 11/29/15 0810 12/01/15 1007 12/15/15 0900  WBC  --  9.7 9.3 5.8  HGB 11.2* 9.0* 9.1* 10.9*  HCT 33.0* 27.5* 29.3* 33.0*  PLT  --  156 198 204    COAGS:  Recent Labs   11/29/15 0810 12/15/15 0900  INR 1.15 1.03  APTT 30 29    BMP:  Recent Labs  11/29/15 0810 11/30/15 0158 12/01/15 0155 12/15/15 0900  NA 133* 134* 135 132*  K 3.9 3.7 3.5 5.5*  CL 97* 98* 97* 96*  CO2 24 25 30 27   GLUCOSE 371* 251* 200* 214*  BUN 45* 62* 33* 58*  CALCIUM 8.0* 7.7* 7.8* 9.0  CREATININE 7.11* 7.69* 5.09* 10.40*  GFRNONAA 8* 7* 12* 5*  GFRAA 9* 8* 14* 6*    LIVER FUNCTION TESTS:  Recent Labs  05/21/15 1515 06/18/15 1338 11/29/15 0810 12/15/15 0900  BILITOT  --   --  0.7 0.6  AST  --   --  17 19  ALT  --   --  14* 17  ALKPHOS  --   --  45 67  PROT  --   --  5.8* 6.6  ALBUMIN 2.6* 2.3* 2.6* 3.2*    TUMOR MARKERS: No results for input(s): AFPTM, CEA, CA199, CHROMGRNA in the last 8760 hours.  Assessment and Plan:  RUA dialysis graft clotted Now scheduled for thrombolysis with poss pta/stent. Poss tunneled dialysis catheter if needed. Risks and Benefits discussed with the patient including, but not limited to bleeding, infection, vascular injury, pulmonary embolism, need for tunneled HD catheter placement or even death. All of the patient's questions were answered, patient is agreeable to proceed. Consent signed and in chart.   Thank you for this interesting consult.  I greatly enjoyed meeting Anthony Bullock and look forward to participating in their care.  A copy of this report was sent to the requesting provider on this date.  Electronically Signed: Takerra Lupinacci A 12/15/2015, 9:57 AM   I spent a total of    25 Minutes in face to face in clinical consultation, greater than 50% of which was counseling/coordinating care for right upper arm dialysis graft declot

## 2015-12-15 NOTE — Sedation Documentation (Signed)
Patient denies pain and is resting comfortably.  

## 2015-12-26 ENCOUNTER — Other Ambulatory Visit: Payer: Self-pay | Admitting: Family Medicine

## 2015-12-28 ENCOUNTER — Other Ambulatory Visit (HOSPITAL_COMMUNITY): Payer: Self-pay | Admitting: Nephrology

## 2015-12-28 ENCOUNTER — Encounter (HOSPITAL_COMMUNITY): Payer: Self-pay

## 2015-12-28 ENCOUNTER — Ambulatory Visit (HOSPITAL_COMMUNITY)
Admission: RE | Admit: 2015-12-28 | Discharge: 2015-12-28 | Disposition: A | Discharge status: Home / Self Care | Payer: BLUE CROSS/BLUE SHIELD | Source: Ambulatory Visit | Attending: Nephrology | Admitting: Nephrology

## 2015-12-28 DIAGNOSIS — Z833 Family history of diabetes mellitus: Secondary | ICD-10-CM | POA: Diagnosis not present

## 2015-12-28 DIAGNOSIS — Z87442 Personal history of urinary calculi: Secondary | ICD-10-CM | POA: Diagnosis not present

## 2015-12-28 DIAGNOSIS — Z808 Family history of malignant neoplasm of other organs or systems: Secondary | ICD-10-CM | POA: Insufficient documentation

## 2015-12-28 DIAGNOSIS — E1122 Type 2 diabetes mellitus with diabetic chronic kidney disease: Secondary | ICD-10-CM | POA: Insufficient documentation

## 2015-12-28 DIAGNOSIS — Z823 Family history of stroke: Secondary | ICD-10-CM | POA: Diagnosis not present

## 2015-12-28 DIAGNOSIS — I5022 Chronic systolic (congestive) heart failure: Secondary | ICD-10-CM | POA: Insufficient documentation

## 2015-12-28 DIAGNOSIS — E1121 Type 2 diabetes mellitus with diabetic nephropathy: Secondary | ICD-10-CM | POA: Insufficient documentation

## 2015-12-28 DIAGNOSIS — I132 Hypertensive heart and chronic kidney disease with heart failure and with stage 5 chronic kidney disease, or end stage renal disease: Secondary | ICD-10-CM | POA: Diagnosis not present

## 2015-12-28 DIAGNOSIS — Z95810 Presence of automatic (implantable) cardiac defibrillator: Secondary | ICD-10-CM | POA: Insufficient documentation

## 2015-12-28 DIAGNOSIS — J449 Chronic obstructive pulmonary disease, unspecified: Secondary | ICD-10-CM | POA: Insufficient documentation

## 2015-12-28 DIAGNOSIS — M1990 Unspecified osteoarthritis, unspecified site: Secondary | ICD-10-CM | POA: Insufficient documentation

## 2015-12-28 DIAGNOSIS — Z794 Long term (current) use of insulin: Secondary | ICD-10-CM | POA: Diagnosis not present

## 2015-12-28 DIAGNOSIS — D631 Anemia in chronic kidney disease: Secondary | ICD-10-CM | POA: Diagnosis not present

## 2015-12-28 DIAGNOSIS — J309 Allergic rhinitis, unspecified: Secondary | ICD-10-CM | POA: Insufficient documentation

## 2015-12-28 DIAGNOSIS — N186 End stage renal disease: Secondary | ICD-10-CM | POA: Insufficient documentation

## 2015-12-28 DIAGNOSIS — Z96651 Presence of right artificial knee joint: Secondary | ICD-10-CM | POA: Diagnosis not present

## 2015-12-28 DIAGNOSIS — E1140 Type 2 diabetes mellitus with diabetic neuropathy, unspecified: Secondary | ICD-10-CM | POA: Insufficient documentation

## 2015-12-28 DIAGNOSIS — T82858D Stenosis of vascular prosthetic devices, implants and grafts, subsequent encounter: Secondary | ICD-10-CM | POA: Diagnosis not present

## 2015-12-28 DIAGNOSIS — E785 Hyperlipidemia, unspecified: Secondary | ICD-10-CM | POA: Diagnosis not present

## 2015-12-28 DIAGNOSIS — Z8052 Family history of malignant neoplasm of bladder: Secondary | ICD-10-CM | POA: Insufficient documentation

## 2015-12-28 DIAGNOSIS — Z992 Dependence on renal dialysis: Secondary | ICD-10-CM | POA: Insufficient documentation

## 2015-12-28 DIAGNOSIS — I428 Other cardiomyopathies: Secondary | ICD-10-CM | POA: Diagnosis not present

## 2015-12-28 DIAGNOSIS — Z7982 Long term (current) use of aspirin: Secondary | ICD-10-CM | POA: Insufficient documentation

## 2015-12-28 DIAGNOSIS — Z87891 Personal history of nicotine dependence: Secondary | ICD-10-CM | POA: Insufficient documentation

## 2015-12-28 DIAGNOSIS — Z8249 Family history of ischemic heart disease and other diseases of the circulatory system: Secondary | ICD-10-CM | POA: Diagnosis not present

## 2015-12-28 HISTORY — PX: IR GENERIC HISTORICAL: IMG1180011

## 2015-12-28 LAB — BASIC METABOLIC PANEL
Anion gap: 12 (ref 5–15)
BUN: 53 mg/dL — ABNORMAL HIGH (ref 6–20)
CO2: 24 mmol/L (ref 22–32)
Calcium: 9.1 mg/dL (ref 8.9–10.3)
Chloride: 96 mmol/L — ABNORMAL LOW (ref 101–111)
Creatinine, Ser: 9.46 mg/dL — ABNORMAL HIGH (ref 0.61–1.24)
GFR calc Af Amer: 6 mL/min — ABNORMAL LOW (ref >60)
GFR calc non Af Amer: 6 mL/min — ABNORMAL LOW (ref >60)
Glucose, Bld: 470 mg/dL — ABNORMAL HIGH (ref 65–99)
Potassium: 4.7 mmol/L (ref 3.5–5.1)
Sodium: 132 mmol/L — ABNORMAL LOW (ref 135–145)

## 2015-12-28 LAB — GLUCOSE, CAPILLARY
Glucose-Capillary: 394 mg/dL — ABNORMAL HIGH (ref 65–99)
Glucose-Capillary: 228 mg/dL — ABNORMAL HIGH (ref 65–99)

## 2015-12-28 LAB — PROTIME-INR
INR: 0.89
Prothrombin Time: 12 seconds (ref 11.4–15.2)

## 2015-12-28 LAB — CBC
HCT: 33.7 % — ABNORMAL LOW (ref 39.0–52.0)
Hemoglobin: 11 g/dL — ABNORMAL LOW (ref 13.0–17.0)
MCH: 27 pg (ref 26.0–34.0)
MCHC: 32.6 g/dL (ref 30.0–36.0)
MCV: 82.8 fL (ref 78.0–100.0)
Platelets: 190 10*3/uL (ref 150–400)
RBC: 4.07 MIL/uL — ABNORMAL LOW (ref 4.22–5.81)
RDW: 15.5 % (ref 11.5–15.5)
WBC: 5.2 10*3/uL (ref 4.0–10.5)

## 2015-12-28 MED ORDER — FENTANYL CITRATE (PF) 100 MCG/2ML IJ SOLN
INTRAMUSCULAR | Status: AC | PRN
  Administered 2015-12-28: 50 ug via INTRAVENOUS

## 2015-12-28 MED ORDER — MIDAZOLAM HCL 2 MG/2ML IJ SOLN
INTRAMUSCULAR | Status: AC | PRN
  Administered 2015-12-28: 1 mg via INTRAVENOUS

## 2015-12-28 MED ORDER — LIDOCAINE HCL (PF) 1 % IJ SOLN
INTRAMUSCULAR | Status: AC
  Filled 2015-12-28: qty 30

## 2015-12-28 MED ORDER — INSULIN ASPART 100 UNIT/ML ~~LOC~~ SOLN
10.0000 [IU] | Freq: Once | SUBCUTANEOUS | Status: AC
  Administered 2015-12-28: 10 [IU] via SUBCUTANEOUS

## 2015-12-28 MED ORDER — CEFAZOLIN SODIUM-DEXTROSE 2-4 GM/100ML-% IV SOLN
2.0000 g | INTRAVENOUS | Status: AC
  Administered 2015-12-28: 2 g via INTRAVENOUS

## 2015-12-28 MED ORDER — HEPARIN SODIUM (PORCINE) 1000 UNIT/ML IJ SOLN
INTRAMUSCULAR | Status: AC
  Filled 2015-12-28: qty 1

## 2015-12-28 MED ORDER — CEFAZOLIN SODIUM-DEXTROSE 2-4 GM/100ML-% IV SOLN
INTRAVENOUS | Status: AC
  Filled 2015-12-28: qty 100

## 2015-12-28 MED ORDER — INSULIN ASPART 100 UNIT/ML ~~LOC~~ SOLN
SUBCUTANEOUS | Status: AC
  Filled 2015-12-28: qty 1

## 2015-12-28 MED ORDER — MIDAZOLAM HCL 2 MG/2ML IJ SOLN
INTRAMUSCULAR | Status: AC
  Filled 2015-12-28: qty 4

## 2015-12-28 MED ORDER — FENTANYL CITRATE (PF) 100 MCG/2ML IJ SOLN
INTRAMUSCULAR | Status: AC
  Filled 2015-12-28: qty 4

## 2015-12-28 NOTE — Procedures (Signed)
RIJV HD cath SVC RA No comp/EBL

## 2015-12-28 NOTE — Sedation Documentation (Signed)
O2 2l/[place] started 

## 2015-12-28 NOTE — Discharge Instructions (Signed)
Vascular Access for Hemodialysis A vascular access is a connection between two blood vessels that allows blood to be easily removed from the body and returned to the body during hemodialysis. Hemodialysis is a procedure in which a machine outside of the body filters the blood. There are three types of vascular accesses:   Arteriovenous fistula. This is a connection between an artery and a vein (usually in the arm) that is made by sewing them together. Blood in the artery flows directly into the vein, causing it to get larger over time. This makes it easier for the vein to be used for hemodialysis. An arteriovenous fistula takes 1-6 months to develop after surgery.   Arteriovenous graft. This is a connection between an artery and a vein in the arm that is made with a tube. An arteriovenous graft can be used within 2-3 weeks of surgery.   Venous catheter. This is a thin, flexible tube that is placed in a large vein (usually in the neck, chest, or groin). A venous catheter for hemodialysis contains two tubes that come out of the skin. A venous catheter can be used right away. It is usually used as a temporary access if you need hemodialysis before a fistula or graft has developed. It may also be used as a permanent access if a fistula or graft cannot be created. WHICH TYPE OF ACCESS IS BEST FOR ME? The type of access that is best for you depends on the size and strength of your veins.  A fistula is usually the preferred type of access. It can last several years and is less likely than the other types of accesses to become infected or to cause blood clots within a blood vessel (thrombosis). However, a fistula is not an option for everyone. If your veins are not the right size, a graft may be used instead. Grafts require you to have strong veins. If your veins are not strong enough for a graft, a catheter may be used. Catheters are more likely than fistulas and grafts to become infected or to have thrombosis.   Sometimes, only one type of access is an option. Your health care provider will help you determine which type of access is best for you.  HOW IS A VASCULAR ACCESS USED? The way the access is used depends on the type of access:   If the access is a fistula or graft, two needles are inserted through the skin into the access before each hemodialysis session. Blood leaves the body through one of the needles and travels through a tube to the hemodialysis machine (dialyzer). It then flows through another tube and returns to the body through the second needle.   If the access is a catheter, one tube is connected directly to the tube that leads to the dialyzer and the other is connected to a tube that leads away from the dialyzer. Blood leaves the body through one tube and returns to the body through the other.  WHAT KIND OF PROBLEMS CAN OCCUR WITH VASCULAR ACCESSES?  Blood clots within a blood vessel (thrombosis). Thrombosis can lead to a narrowing of a blood vessel or tube (stenosis). If thrombosis occurs frequently, another access site may be created as a backup.   Infection.  These problems are most likely to occur with a venous catheter and least likely to occur with an arteriovenous fistula.  HOW DO I CARE FOR MY VASCULAR ACCESS? Wear a medical alert bracelet. This tells health care providers that you are  a dialysis patient in the case of an emergency and allows them to care for your veins appropriately. If you have a graft or fistula:   A "bruit" is a noise that is heard with a stethoscope and a "thrill" is a vibration felt over the graft or fistula. The presence of the bruit and thrill indicates that the access is working. You will be taught to feel for the thrill each day. If this is not felt, the access may be clotted. Call your health care provider.   You may use the arm where your vascular access is located freely after the site heals. Keep the following in mind:   Avoid pressure on  the arm.   Avoid lifting heavy objects with the arm.   Avoid sleeping on the arm.   Avoid wearing tight-sleeved shirts or jewelry around the graft or fistula.   Do not allow blood pressure monitoring or needle punctures on the side where the graft or fistula is located.   With permission from your health care provider, you may do exercises to help with blood flow through a fistula. These exercises involve squeezing a rubber ball or other soft objects as instructed. SEEK MEDICAL CARE IF:   Chills develop.   You have an oral temperature above 102 F (38.9 C).  Swelling around the graft or fistula gets worse.   New pain develops.   Pus or other fluid (drainage) is seen at the vascular access site.   Skin redness or red streaking is seen on the skin around, above, or below the vascular access. SEEK IMMEDIATE MEDICAL CARE IF:   Pain, numbness, or an unusual pale skin color develops in the hand on the side of your fistula.   Dizziness or weakness develops that you have not had before.   The vascular access has bleeding that cannot be easily controlled. This information is not intended to replace advice given to you by your health care provider. Make sure you discuss any questions you have with your health care provider. Document Released: 03/19/2002 Document Revised: 01/17/2014 Document Reviewed: 05/15/2012 Elsevier Interactive Patient Education  2017 [employer].

## 2015-12-28 NOTE — H&P (Signed)
Chief Complaint: Patient was seen in consultation today for tunneled hemodialysis catheter placement at the request of [place]  Referring Physician(s): Fran Lowes  Supervising Physician: Marybelle Killings  Patient Status: [facility] - Out-pt  History of Present Illness: Anthony Bullock is a 55 y.o. male   Right upper arm dialysis fistula thrombolysis and angioplasty 12/15/2015 and 11/30/2015 Clotted again now Noted this am Dialysis center has called for another intervention. Discussed with Dr Barbie Banner Plan is best for surgical consult and placement of tunneled dialysis catheter placement today secondary recent attempts --unsuccessful. All agree.  Now scheduled for same Dr Hinda Lenis to arrange surgical consult if needed.   Past Medical History:  Diagnosis Date  . 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 Allergic rhinitis   . Anemia   . Arthritis   . Chronic systolic heart failure ([place])    a. ECHO (12/2012) EF 25-30%, HK entireanteroseptal myocardium //  b.  EF 25%, diffuse HK, grade 1 diastolic dysfunction, MAC, mild LAE, normal RVSF, trivial pericardial effusion  . COPD (chronic obstructive pulmonary disease) ([place])   . Diabetes mellitus type II   . Diabetic nephropathy ([place])   . Diabetic neuropathy ([place])   . ESRD on hemodialysis [facility])    started HD June 2017, goes to [facility] HD unit, Dr Hinda Lenis  . History of cardiac catheterization    a.Myoview 1/15:  There is significant left ventricular dysfunction. There may be slight scar at the apex. There is no significant ischemia. LV Ejection Fraction: 27%  //  b. RHC/LHC (1/15) with mean RA 6, PA 47/22 mean 33, mean PCWP 20, PVR 2.5 WU, CI 2.5; 80% dLAD stenosis, 70% diffuse large D.   . Hyperlipidemia   . Hypertension   . Kidney stones   . NICM (nonischemic cardiomyopathy) ([place])    Primarily nonischemic. Echo (12/14) with EF 25-30%. Echo (3/15) with EF 25%, mild to moderately  dilated LV, normal RV size and systolic function.   . Pneumonia   . Urethral stricture     Past Surgical History:  Procedure Laterality Date  . AV FISTULA PLACEMENT Right 09/08/2015   Procedure: INSERTION OF 4-61mm x 45cm  ARTERIOVENOUS (AV) GORE-TEX GRAFT RIGHT UPPER  ARM;  Surgeon: Angelia Mould, MD;  Location: [place];  Service: Vascular;  Laterality: Right;  . BASCILIC VEIN TRANSPOSITION Right 08/22/2014   Procedure: RIGHT UPPER ARM BASCILIC VEIN TRANSPOSITION;  Surgeon: Angelia Mould, MD;  Location: [place];  Service: Vascular;  Laterality: Right;  . CARDIAC CATHETERIZATION    . CARDIAC DEFIBRILLATOR PLACEMENT  06/27/2013   Sub Q       BY DR Caryl Comes  . COLONOSCOPY WITH PROPOFOL N/A 07/22/2015   Procedure: COLONOSCOPY WITH PROPOFOL;  Surgeon: Doran Stabler, MD;  Location: [facility] ENDOS[facility];  Service: Gastroenterology;  Laterality: N/A;  . HERNIA REPAIR    . IMPLANTABLE CARDIOVERTER DEFIBRILLATOR IMPLANT N/A 06/27/2013   Procedure: SUB Q ICD;  Surgeon: Deboraha Sprang, MD;  Location: [facility] CATH LAB;  Service: Cardiovascular;  Laterality: N/A;  . IR GENERIC HISTORICAL Right 11/30/2015   IR THROMBECTOMY AV FISTULA W/THROMBOLYSIS/PTA INC/SHUNT/IMG RIGHT 11/30/2015 Aletta Edouard, MD [facility]-INTER[facility] RAD  . IR GENERIC HISTORICAL  11/30/2015   IR US GUIDE VASC ACCESS RIGHT 11/30/2015 Aletta Edouard, MD [facility]  . IR GENERIC HISTORICAL Right 12/15/2015   IR THROMBECTOMY AV FISTULA W/THROMBOLYSIS/PTA/STENT INC/SHUNT/IMG RT 12/15/2015 Arne Cleveland, MD [facility]  . IR GENERIC HISTORICAL  12/15/2015  IR US GUIDE VASC ACCESS RIGHT 12/15/2015 Arne Cleveland, MD [facility]  . LEFT A ND RIGHT HEART CATH  01/30/2013   DR Sung Amabile  . LEFT AND RIGHT HEART CATHETERIZATION WITH CORONARY ANGIOGRAM N/A 01/30/2013   Procedure: LEFT AND RIGHT HEART CATHETERIZATION WITH CORONARY ANGIOGRAM;  Surgeon: Jolaine Artist, MD;  Location: [facility] CATH LAB;  Service: Cardiovascular;  Laterality: N/A;  .  PERIPHERAL VASCULAR CATHETERIZATION Right 01/26/2015   Procedure: A/V Fistulagram;  Surgeon: Angelia Mould, MD;  Location: [place] CV LAB;  Service: Cardiovascular;  Laterality: Right;  . reapea urethral surgery for recurrent obstruction  2011  . TOTAL KNEE ARTHROPLASTY Right 2007    Allergies: Morphine sulfate  Medications: Prior to Admission medications   Medication Sig Start Date End Date Taking? Authorizing Provider  aspirin EC 81 MG tablet Take 81 mg by mouth daily.     Historical Provider, MD  atorvastatin (LIPITOR) 80 MG tablet TAKE 1 TABLET AT BEDTIME 12/28/15   [name], MD  azelastine (ASTELIN) 0.1 % nasal spray Place 2 sprays into both nostrils 3 (three) times daily as needed for rhinitis. Use in each nostril as directed 03/10/14   [name], MD  BD PEN NEEDLE NANO U/F 32G X 4 MM MISC USE ONCE DAILY 04/03/15   [name], MD  carvedilol (COREG) 12.5 MG tablet Take 1 tablet (12.5 mg total) by mouth 2 (two) times daily with a meal. 12/01/15   Kerney Elbe, DO  fenofibrate 160 MG tablet TAKE 1 TABLET DAILY 12/28/15   [name], MD  fexofenadine (ALLEGRA) 180 MG tablet Take 180 mg by mouth daily.    Historical Provider, MD  gabapentin (NEURONTIN) 300 MG capsule Take 1 capsule (300 mg total) by mouth 3 (three) times daily. 12/08/15   [name], MD  glucose blood test strip Check 1 time daily. E11.9 One Touch Ultra Blue Test Strips 03/10/14   [name], MD  HYDROcodone-acetaminophen (NORCO/VICODIN) 5-325 MG tablet Take 1 tablet by mouth every 6 (six) hours as needed for moderate pain. 09/08/15   Samantha J Rhyne, PA-C  Insulin Glargine (LANTUS SOLOSTAR) 100 UNIT/ML Solostar Pen Inject 40 Units into the skin daily at 10 pm.     Historical Provider, MD  insulin lispro (HUMALOG) 100 UNIT/ML injection Inject 0-14 Units into the skin 3 (three) times daily as needed for high blood sugar (per sliding scale).    Historical Provider, MD    lanthanum (FOSRENOL) 1000 MG chewable tablet Chew 1,000 mg by mouth. Take one tablet after each meal.    Historical Provider, MD  mupirocin ointment (BACTROBAN) 2 % Place 1 application into the nose 2 (two) times daily as needed. 12/08/15   [name], MD  Olopatadine HCl (PATADAY) 0.2 % SOLN Place 1 drop into both eyes daily as needed (for allergies).     Historical Provider, MD     Family History  Problem Relation Age of Onset  . Bladder Cancer Mother   . Alcohol abuse Father   . Melanoma Father   . Stroke Maternal Grandmother   . Heart Problems Maternal Grandmother     unknown  . Diabetes Maternal Grandmother   . Heart disease Maternal Grandfather   . Prostate cancer Maternal Grandfather     Social History   Social History  . Marital status: Married    Spouse name: N/A  . Number of children: 0  . Years of education: N/A  Social History Main Topics  . Smoking status: Former Smoker    Packs/day: 2.00    Years: 32.00    Types: Cigarettes    Quit date: 05/11/2010  . Smokeless tobacco: Never Used  . Alcohol use No  . Drug use: No  . Sexual activity: Not Asked   Other Topics Concern  . 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  All other systems are negative.  Review of Systems  Constitutional: Negative for activity change, appetite change, diaphoresis, fatigue and fever.  Respiratory: Negative for chest tightness.   Cardiovascular: Negative for chest pain.  Neurological: Negative for weakness.  Psychiatric/Behavioral: Negative for behavioral problems and confusion.    Vital Signs: BP (!) 180/80 (BP Location: Left Arm)   Pulse 75   SpO2 100%   Physical Exam  Constitutional: He is oriented to person, place, and time.  Cardiovascular: Normal rate, regular rhythm and normal heart sounds.   Pulmonary/Chest: Effort normal and  breath sounds normal.  Abdominal: Soft. Bowel sounds are normal.  Musculoskeletal: Normal range of motion.  Right upper arm fistula No pulse No thrill  Neurological: He is alert and oriented to person, place, and time.  Skin: Skin is warm and dry.  Psychiatric: He has a normal mood and affect. His behavior is normal. Judgment and thought content normal.  Nursing note and vitals reviewed.   Mallampati Score:  MD Evaluation Airway: WNL Heart: WNL Abdomen: WNL Chest/ Lungs: WNL Mallampati/Airway Score: One  Imaging: Dg Chest 2 View  Result Date: 11/30/2015 CLINICAL DATA:  2-3 days of productive cough and shortness of breath. EXAM: CHEST  2 VIEW COMPARISON:  Portable chest x-ray of November 28, 2015 FINDINGS: The lungs are adequately inflated. The interstitial markings are coarse but have improved significantly since the study of 2 days ago. Heart remains top-normal in size. The pulmonary vascularity is not engorged today. The ICD is in stable position. The mediastinum is normal in width. There is a a trace of pleural fluid blunting the costophrenic angles on the left. The observed bony thorax exhibits no acute abnormality. IMPRESSION: Marked improvement in pulmonary edema since the previous study. Persistent coarse infrahilar lung markings greatest on the right likely reflect atelectasis. Trace left pleural effusion. Electronically Signed   By: David  Martinique M.D.   On: 11/30/2015 07:44   Ir US Guide Vasc Access Right  Result Date: 12/15/2015 CLINICAL DATA:  Occluded right upper arm synthetic straight dialysis graft. Recent declot 11/30/2015 with 7 mm PTA of venous anastomotic stenosis. EXAM: EXAM DIALYSIS GRAFT DECLOT VENOUS STENT ASSISTED ANGIOPLASTY ULTRASOUND GUIDANCE FOR VASCULAR ACCESS X2 COMPARISON:  11/30/2015 TECHNIQUE: The procedure, risks (including but not limited to bleeding, infection, organ damage ), benefits, and alternatives were explained to the patient. Questions regarding  the procedure were encouraged and answered. The patient understands and consents to the procedure. Intravenous Fentanyl and Versed were administered as conscious sedation during continuous monitoring of the patient's level of consciousness and physiological / cardiorespiratory status by the radiology RN, with a total moderate sedation time of 45 minutes. The graft just central to the arterial anastomosis was accessed antegrade with a 21-gauge micropuncture needle under real-time ultrasonic guidance after the overlying skin prepped with Betadine, draped in usual sterile fashion, infiltrated locally with 1% lidocaine. Needle exchanged over 018 guidewire for transitional dilator through which  2 mg t-PA was administered. Ultrasound imaging documentation was saved. Through the dilator, a Bentson wire was advanced to the venous anastomosis. Over this a 58F sheath was placed, through which a 5 [place] Kumpe catheter was advanced for outflow venography. This showed patency of the outflow venous system through the SVC. 3000 units heparin were administered. The Arrow PTD device was used to macerate thrombus in the graft. In similar fashion, the more central aspect of the graft was accessed retrograde under ultrasound with a micropuncture needle, exchanged for a transitional dilator. The venous limb dilator was exchanged in similar fashion for a 6 French vascular sheath. The angiographic catheter was advanced across the arterial anastomosis into the proximal brachial artery, catheter exchanged for a Fogarty catheter used to dislodge the platelet plug into the arterial limb of the graft with 2 passes. The angiographic catheter was advanced back into the brachial artery and injection demonstrated patency of the arterial anastomosis with some debris adjacent to the sheath in the graft. The PTD device was used again to remove residual clot from the graft. Injection showed clearance of thrombus from the graft. There is a tapered  high-grade short-segment stenosis at the venous anastomosis, site of previous angioplasty. Because of the precipitous re-stenosis, an 8 mm x 40 mm FLAIR stent was then deployed across the venous anastomosis. Balloon angioplasty of the venous anastomosis was performed using a 62mm x 4 cm Conquest angioplasty balloon with good response. Balloon was removed and injection showed patency of the stent and anastomosis, without extravasation or other apparent complication. The catheter and sheaths were then removed and hemostasis achieved with 2-0 Ethilon sutures. Patient tolerated procedure well. FLUOROSCOPY TIME:  4.9 minute (062 uGym2 DAP) COMPLICATIONS: COMPLICATIONS none IMPRESSION: 1. Technically successful declot of right upper arm straight synthetic hemodialysis graft. 2. Technically successful stent assisted balloon angioplasty of recurrent venous anastomotic stenosis. ACCESS: Remains approachable for percutaneous intervention as needed. Electronically Signed   By: Lucrezia Europe M.D.   On: 12/15/2015 12:18   Ir US Guide Vasc Access Right  Result Date: 11/30/2015 CLINICAL DATA:  Thrombosis of right upper arm dialysis graft. EXAM: 1. ULTRASOUND GUIDANCE FOR VASCULAR ACCESS OF DIALYSIS GRAFT. 2. DIALYSIS GRAFT DECLOT PROCEDURE WITH TWO SEPARATE GRAFT ACCESS SITES. 3. VENOUS ANGIOPLASTY OF DIALYSIS GRAFT VENOUS ANASTOMOSIS. ANESTHESIA/SEDATION: 2.0 mg IV Versed; 100 mcg IV Fentanyl. Total Moderate Sedation Time 52 minutes. CONTRAST:  52mL ISOVUE-300 IOPAMIDOL (ISOVUE-300) INJECTION 61% MEDICATIONS: 2 mg tPA, 3000 U IV heparin FLUOROSCOPY TIME:  3 minutes and 48 seconds.  21 mGy. PROCEDURE: The procedure, risks, benefits, and alternatives were explained to the patient. Questions regarding the procedure were encouraged and answered. The patient understands and consents to the procedure. A time-out was performed prior to initiating the procedure. The right upper arm dialysis graft was prepped with chlorhexidine in a  sterile fashion, and a sterile drape was applied covering the operative field. A sterile gown and sterile gloves were used for the procedure. Local anesthesia was provided with 1% Lidocaine. Preliminary ultrasound was performed of the right upper arm dialysis graft. Both antegrade and retrograde graft access was performed with micropuncture sets under direct ultrasound guidance. Ultrasound image documentation was performed. t-PA was instilled via each access. 7-French antegrade and 6-French retrograde sheaths were placed. A diagnostic catheter was advanced and contrast injection performed at the level of patent venous outflow. Outflow venography was also performed via the catheter. Balloon angioplasty was performed at the level of the venous anastomosis with a 7 mm  x 4 cm Conquest balloon. Mechanical thrombectomy was performed within the dialysis graft with the Angiojet device. Thrombectomy across the arterial anastomosis was then performed with a 4-French Fogarty balloon catheter. Several passes were made with the Fogarty catheter. Suction thrombectomy was then performed through the antegrade sheath. Graft patency was reassessed with angiography. Upon completion of the procedure, both sheaths were removed and hemostasis obtained with Ethilon pursestring sutures. COMPLICATIONS: None FINDINGS: Ultrasound confirms thrombosis of the graft. Etiology of thrombosis is a tight stenosis at the venous anastomosis of the graft with the axillary vein. After reestablishing flow in the graft and clearing thrombus, the anastomosis responded well to 7 mm balloon angioplasty with no significant residual stenosis remaining. Rapid flow was re-established in the graft. The arterial anastomosis is widely patent after thrombectomy. Venous outflow is normally patent including central veins in the chest. IMPRESSION: Successful declot procedure to reestablish flow in an occluded right upper arm dialysis graft. Etiology was stenosis at the  venous anastomosis which responded well to 7 mm balloon angioplasty. ACCESS: The graft remains amenable to percutaneous intervention. Electronically Signed   By: Aletta Edouard M.D.   On: 11/30/2015 11:56   Ir Thrombectomy Av Fistula/w Thrombolysis/pta Inc Shunt/img Right  Result Date: 11/30/2015 CLINICAL DATA:  Thrombosis of right upper arm dialysis graft. EXAM: 1. ULTRASOUND GUIDANCE FOR VASCULAR ACCESS OF DIALYSIS GRAFT. 2. DIALYSIS GRAFT DECLOT PROCEDURE WITH TWO SEPARATE GRAFT ACCESS SITES. 3. VENOUS ANGIOPLASTY OF DIALYSIS GRAFT VENOUS ANASTOMOSIS. ANESTHESIA/SEDATION: 2.0 mg IV Versed; 100 mcg IV Fentanyl. Total Moderate Sedation Time 52 minutes. CONTRAST:  34mL ISOVUE-300 IOPAMIDOL (ISOVUE-300) INJECTION 61% MEDICATIONS: 2 mg tPA, 3000 U IV heparin FLUOROSCOPY TIME:  3 minutes and 48 seconds.  21 mGy. PROCEDURE: The procedure, risks, benefits, and alternatives were explained to the patient. Questions regarding the procedure were encouraged and answered. The patient understands and consents to the procedure. A time-out was performed prior to initiating the procedure. The right upper arm dialysis graft was prepped with chlorhexidine in a sterile fashion, and a sterile drape was applied covering the operative field. A sterile gown and sterile gloves were used for the procedure. Local anesthesia was provided with 1% Lidocaine. Preliminary ultrasound was performed of the right upper arm dialysis graft. Both antegrade and retrograde graft access was performed with micropuncture sets under direct ultrasound guidance. Ultrasound image documentation was performed. t-PA was instilled via each access. 7-French antegrade and 6-French retrograde sheaths were placed. A diagnostic catheter was advanced and contrast injection performed at the level of patent venous outflow. Outflow venography was also performed via the catheter. Balloon angioplasty was performed at the level of the venous anastomosis with a 7 mm x 4  cm Conquest balloon. Mechanical thrombectomy was performed within the dialysis graft with the Angiojet device. Thrombectomy across the arterial anastomosis was then performed with a 4-French Fogarty balloon catheter. Several passes were made with the Fogarty catheter. Suction thrombectomy was then performed through the antegrade sheath. Graft patency was reassessed with angiography. Upon completion of the procedure, both sheaths were removed and hemostasis obtained with Ethilon pursestring sutures. COMPLICATIONS: None FINDINGS: Ultrasound confirms thrombosis of the graft. Etiology of thrombosis is a tight stenosis at the venous anastomosis of the graft with the axillary vein. After reestablishing flow in the graft and clearing thrombus, the anastomosis responded well to 7 mm balloon angioplasty with no significant residual stenosis remaining. Rapid flow was re-established in the graft. The arterial anastomosis is widely patent after thrombectomy. Venous outflow is normally patent  including central veins in the chest. IMPRESSION: Successful declot procedure to reestablish flow in an occluded right upper arm dialysis graft. Etiology was stenosis at the venous anastomosis which responded well to 7 mm balloon angioplasty. ACCESS: The graft remains amenable to percutaneous intervention. Electronically Signed   By: Aletta Edouard M.D.   On: 11/30/2015 11:56   Ir Thrombectomy Av Fistula W/thrombolysis/pta/stent Inc Shunt/img Rt  Result Date: 12/15/2015 CLINICAL DATA:  Occluded right upper arm synthetic straight dialysis graft. Recent declot 11/30/2015 with 7 mm PTA of venous anastomotic stenosis. EXAM: EXAM DIALYSIS GRAFT DECLOT VENOUS STENT ASSISTED ANGIOPLASTY ULTRASOUND GUIDANCE FOR VASCULAR ACCESS X2 COMPARISON:  11/30/2015 TECHNIQUE: The procedure, risks (including but not limited to bleeding, infection, organ damage ), benefits, and alternatives were explained to the patient. Questions regarding the procedure  were encouraged and answered. The patient understands and consents to the procedure. Intravenous Fentanyl and Versed were administered as conscious sedation during continuous monitoring of the patient's level of consciousness and physiological / cardiorespiratory status by the radiology RN, with a total moderate sedation time of 45 minutes. The graft just central to the arterial anastomosis was accessed antegrade with a 21-gauge micropuncture needle under real-time ultrasonic guidance after the overlying skin prepped with Betadine, draped in usual sterile fashion, infiltrated locally with 1% lidocaine. Needle exchanged over 018 guidewire for transitional dilator through which 2 mg t-PA was administered. Ultrasound imaging documentation was saved. Through the dilator, a Bentson wire was advanced to the venous anastomosis. Over this a 61F sheath was placed, through which a 5 [place] Kumpe catheter was advanced for outflow venography. This showed patency of the outflow venous system through the SVC. 3000 units heparin were administered. The Arrow PTD device was used to macerate thrombus in the graft. In similar fashion, the more central aspect of the graft was accessed retrograde under ultrasound with a micropuncture needle, exchanged for a transitional dilator. The venous limb dilator was exchanged in similar fashion for a 6 French vascular sheath. The angiographic catheter was advanced across the arterial anastomosis into the proximal brachial artery, catheter exchanged for a Fogarty catheter used to dislodge the platelet plug into the arterial limb of the graft with 2 passes. The angiographic catheter was advanced back into the brachial artery and injection demonstrated patency of the arterial anastomosis with some debris adjacent to the sheath in the graft. The PTD device was used again to remove residual clot from the graft. Injection showed clearance of thrombus from the graft. There is a tapered high-grade  short-segment stenosis at the venous anastomosis, site of previous angioplasty. Because of the precipitous re-stenosis, an 8 mm x 40 mm FLAIR stent was then deployed across the venous anastomosis. Balloon angioplasty of the venous anastomosis was performed using a 58mm x 4 cm Conquest angioplasty balloon with good response. Balloon was removed and injection showed patency of the stent and anastomosis, without extravasation or other apparent complication. The catheter and sheaths were then removed and hemostasis achieved with 2-0 Ethilon sutures. Patient tolerated procedure well. FLUOROSCOPY TIME:  4.9 minute (517 uGym2 DAP) COMPLICATIONS: COMPLICATIONS none IMPRESSION: 1. Technically successful declot of right upper arm straight synthetic hemodialysis graft. 2. Technically successful stent assisted balloon angioplasty of recurrent venous anastomotic stenosis. ACCESS: Remains approachable for percutaneous intervention as needed. Electronically Signed   By: Lucrezia Europe M.D.   On: 12/15/2015 12:18    Labs:  CBC:  Recent Labs  09/08/15 0636 11/29/15 0810 12/01/15 1007 12/15/15 0900  WBC  --  9.7 9.3 5.8  HGB 11.2* 9.0* 9.1* 10.9*  HCT 33.0* 27.5* 29.3* 33.0*  PLT  --  156 198 204    COAGS:  Recent Labs  11/29/15 0810 12/15/15 0900  INR 1.15 1.03  APTT 30 29    BMP:  Recent Labs  11/29/15 0810 11/30/15 0158 12/01/15 0155 12/15/15 0900  NA 133* 134* 135 132*  K 3.9 3.7 3.5 5.5*  CL 97* 98* 97* 96*  CO2 24 25 30 27   GLUCOSE 371* 251* 200* 214*  BUN 45* 62* 33* 58*  CALCIUM 8.0* 7.7* 7.8* 9.0  CREATININE 7.11* 7.69* 5.09* 10.40*  GFRNONAA 8* 7* 12* 5*  GFRAA 9* 8* 14* 6*    LIVER FUNCTION TESTS:  Recent Labs  05/21/15 1515 06/18/15 1338 11/29/15 0810 12/15/15 0900  BILITOT  --   --  0.7 0.6  AST  --   --  17 19  ALT  --   --  14* 17  ALKPHOS  --   --  45 67  PROT  --   --  5.8* 6.6  ALBUMIN 2.6* 2.3* 2.6* 3.2*    TUMOR MARKERS: No results for input(s): AFPTM,  CEA, CA199, CHROMGRNA in the last 8760 hours.  Assessment and Plan:  Clotted RUA dialysis fistula 2 unsuccessful previous declot attempts: 11/20 and 12/15/2015 Now for tunneled dialysis catheter placement Risks and Benefits discussed with the patient including, but not limited to bleeding, infection, vascular injury, pneumothorax which may require chest tube placement, air embolism or even death All of the patient's questions were answered, patient is agreeable to proceed. Consent signed and in chart.  Thank you for this interesting consult.  I greatly enjoyed meeting ANFERNEE PESCHKE and look forward to participating in their care.  A copy of this report was sent to the requesting provider on this date.  Electronically Signed: Monia Sabal A 12/28/2015, 1:15 PM   I spent a total of  30 Minutes   in face to face in clinical consultation, greater than 50% of which was counseling/coordinating care for tunneled dialysis catheter placement

## 2015-12-28 NOTE — Sedation Documentation (Signed)
Patient is resting comfortably. 

## 2015-12-29 ENCOUNTER — Other Ambulatory Visit: Payer: Self-pay

## 2015-12-29 ENCOUNTER — Encounter (HOSPITAL_COMMUNITY): Payer: Self-pay | Admitting: Interventional Radiology

## 2015-12-29 DIAGNOSIS — Z01812 Encounter for preprocedural laboratory examination: Secondary | ICD-10-CM

## 2015-12-29 DIAGNOSIS — N186 End stage renal disease: Secondary | ICD-10-CM

## 2015-12-31 ENCOUNTER — Encounter: Payer: Self-pay | Admitting: Vascular Surgery

## 2016-01-13 ENCOUNTER — Ambulatory Visit (HOSPITAL_COMMUNITY)
Admission: RE | Admit: 2016-01-13 | Discharge: 2016-01-13 | Disposition: A | Discharge status: Home / Self Care | Payer: BLUE CROSS/BLUE SHIELD | Source: Ambulatory Visit | Attending: Vascular Surgery | Admitting: Vascular Surgery

## 2016-01-13 ENCOUNTER — Encounter: Payer: Self-pay | Admitting: *Deleted

## 2016-01-13 ENCOUNTER — Ambulatory Visit (INDEPENDENT_AMBULATORY_CARE_PROVIDER_SITE_OTHER): Payer: BLUE CROSS/BLUE SHIELD | Admitting: Vascular Surgery

## 2016-01-13 ENCOUNTER — Encounter (HOSPITAL_COMMUNITY): Payer: Self-pay | Admitting: *Deleted

## 2016-01-13 ENCOUNTER — Encounter: Payer: Self-pay | Admitting: Vascular Surgery

## 2016-01-13 ENCOUNTER — Other Ambulatory Visit: Payer: Self-pay | Admitting: *Deleted

## 2016-01-13 VITALS — BP 139/76 | HR 82 | Temp 97.9°F | Resp 16 | Ht 70.0 in | Wt 198.0 lb

## 2016-01-13 DIAGNOSIS — N186 End stage renal disease: Secondary | ICD-10-CM

## 2016-01-13 DIAGNOSIS — Z992 Dependence on renal dialysis: Secondary | ICD-10-CM

## 2016-01-13 DIAGNOSIS — Z01812 Encounter for preprocedural laboratory examination: Secondary | ICD-10-CM | POA: Diagnosis not present

## 2016-01-13 NOTE — Progress Notes (Signed)
Spoke with pt for pre-op call. Pt has hx of non-ischemic cardiac disease with ICD. Dr. Caryl Comes is his cardiologist. Pt is diabetic, last A1C was 8.1 on 11/29/15. Pt states his fasting blood sugar usually runs around 150. Pt instructed to take 1/2 of his regular dose of Lantus tonight (will take 20 units). Instructed pt to check his blood sugar in the AM when he gets up and every 2 hours prior to leaving for hospital. If blood sugar is >220 take 1/2 of usual correction dose of Humalog insulin. If blood sugar is 70 or below, treat with 1/2 cup of clear juice (apple or cranberry) and recheck blood sugar 15 minutes after drinking juice. If blood sugar continues to be 70 or below, call the Short Stay department and ask to speak to a nurse. Pt voiced understanding.

## 2016-01-13 NOTE — Progress Notes (Signed)
Patient name: Anthony Bullock MRN: [id number] DOB: Dec 11, 1960 Sex: male  REASON FOR VISIT: Evaluate for hemodialysis access.  HPI: Anthony Bullock is a 56 y.o. male who I saw on 08/19/2015. He had a right basilic vein transposition done on 08/22/2014. On 01/26/2015 he had venoplasty of his fistula. His fistula subsequently occluded. We had discussed placing a fistula in the left arm although he favored placement of an AV graft as he had been frustrated with his fistulas. Therefore an AV graft was placed in the right arm on 09/08/2015. This was a 4-7 mm right upper arm graft. This subsequently occluded and is not salvageable. He states that he has had a catheter for 3 weeks now. He comes in for evaluation for new access.  Of note he had an AICD placed in 2015 but I cannot find the operative report. However this does not go through the left subclavian vein. I reviewed his most recent chest x-ray from November 2017. Dr. Caryl Comes placed his AICD knowing that he would ultimately require access in the left arm.  He dialyzes Monday Wednesdays and Fridays with his right IJ tunneled dialysis catheter.  Past Medical History:  Diagnosis Date  . 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 Allergic rhinitis   . Anemia   . Arthritis   . Chronic systolic heart failure ([place])    a. ECHO (12/2012) EF 25-30%, HK entireanteroseptal myocardium //  b.  EF 25%, diffuse HK, grade 1 diastolic dysfunction, MAC, mild LAE, normal RVSF, trivial pericardial effusion  . COPD (chronic obstructive pulmonary disease) ([place])   . Diabetes mellitus type II   . Diabetic nephropathy ([place])   . Diabetic neuropathy ([place])   . ESRD on hemodialysis [facility])    started HD June 2017, goes to [facility] HD unit, Dr Hinda Lenis  . History of cardiac catheterization    a.Myoview 1/15:  There is significant left ventricular dysfunction. There may be slight scar at the apex. There is no significant ischemia. LV Ejection  Fraction: 27%  //  b. RHC/LHC (1/15) with mean RA 6, PA 47/22 mean 33, mean PCWP 20, PVR 2.5 WU, CI 2.5; 80% dLAD stenosis, 70% diffuse large D.   . Hyperlipidemia   . Hypertension   . Kidney stones   . NICM (nonischemic cardiomyopathy) ([place])    Primarily nonischemic. Echo (12/14) with EF 25-30%. Echo (3/15) with EF 25%, mild to moderately dilated LV, normal RV size and systolic function.   . Pneumonia   . Urethral stricture     Family History  Problem Relation Age of Onset  . Bladder Cancer Mother   . Alcohol abuse Father   . Melanoma Father   . Stroke Maternal Grandmother   . Heart Problems Maternal Grandmother     unknown  . Diabetes Maternal Grandmother   . Heart disease Maternal Grandfather   . Prostate cancer Maternal Grandfather     SOCIAL HISTORY: Social History  Substance Use Topics  . Smoking status: Former Smoker    Packs/day: 2.00    Years: 32.00    Types: Cigarettes    Quit date: 05/11/2010  . Smokeless tobacco: Never Used  . Alcohol use No    Allergies  Allergen Reactions  . Morphine Sulfate Rash and Other (See Comments)    REACTION: Itches all over, red spots    Current Outpatient Prescriptions  Medication Sig Dispense Refill  . aspirin EC 81 MG tablet Take 81 mg by mouth daily.     [name]  atorvastatin (LIPITOR) 80 MG tablet TAKE 1 TABLET AT BEDTIME 90 tablet 3  . azelastine (ASTELIN) 0.1 % nasal spray Place 2 sprays into both nostrils 3 (three) times daily as needed for rhinitis. Use in each nostril as directed 30 mL 12  . BD PEN NEEDLE NANO U/F 32G X 4 MM MISC USE ONCE DAILY 90 each 2  . carvedilol (COREG) 12.5 MG tablet Take 1 tablet (12.5 mg total) by mouth 2 (two) times daily with a meal. 60 tablet 0  . fenofibrate 160 MG tablet TAKE 1 TABLET DAILY 90 tablet 3  . fexofenadine (ALLEGRA) 180 MG tablet Take 180 mg by mouth daily.    [name] gabapentin (NEURONTIN) 300 MG capsule Take 1 capsule (300 mg total) by mouth 3 (three) times daily. 270 capsule 3  .  glucose blood test strip Check 1 time daily. E11.9 One Touch Ultra Blue Test Strips 100 each 3  . HYDROcodone-acetaminophen (NORCO/VICODIN) 5-325 MG tablet Take 1 tablet by mouth every 6 (six) hours as needed for moderate pain. 10 tablet 0  . Insulin Glargine (LANTUS SOLOSTAR) 100 UNIT/ML Solostar Pen Inject 40 Units into the skin daily at 10 pm.     . insulin lispro (HUMALOG) 100 UNIT/ML injection Inject 0-14 Units into the skin 3 (three) times daily as needed for high blood sugar (per sliding scale).    [name] lanthanum (FOSRENOL) 1000 MG chewable tablet Chew 1,000 mg by mouth. Take one tablet after each meal.    . mupirocin ointment (BACTROBAN) 2 % Place 1 application into the nose 2 (two) times daily as needed. 22 g 2  . Olopatadine HCl (PATADAY) 0.2 % SOLN Place 1 drop into both eyes daily as needed (for allergies).      No current facility-administered medications for this visit.     REVIEW OF SYSTEMS:  _0  denotes positive finding, _1  denotes negative finding Cardiac  Comments:  Chest pain or chest pressure:    Shortness of breath upon exertion:    Short of breath when lying flat:    Irregular heart rhythm:        Vascular    Pain in calf, thigh, or hip brought on by ambulation:    Pain in feet at night that wakes you up from your sleep:     Blood clot in your veins:    Leg swelling:         Pulmonary    Oxygen at home:    Productive cough:     Wheezing:         Neurologic    Sudden weakness in arms or legs:     Sudden numbness in arms or legs:     Sudden onset of difficulty speaking or slurred speech:    Temporary loss of vision in one eye:     Problems with dizziness:         Gastrointestinal    Blood in stool:     Vomited blood:         Genitourinary    Burning when urinating:     Blood in urine:        Psychiatric    Major depression:         Hematologic    Bleeding problems:    Problems with blood clotting too easily:        Skin    Rashes or ulcers:         Constitutional    Fever or chills:  PHYSICAL EXAM: Vitals:   01/13/16 1621  BP: 139/76  Pulse: 82  Resp: 16  Temp: 97.9 F (36.6 C)  TempSrc: Oral  SpO2: 100%  Weight: 198 lb (89.8 kg)  Height: _0  (1.778 m)    GENERAL: The patient is a well-nourished male, in no acute distress. The vital signs are documented above. CARDIAC: There is a regular rate and rhythm.  VASCULAR: He has a diminished radial pulse on the left. He does have a biphasic radial and ulnar signal with the Doppler.  PULMONARY: There is good air exchange bilaterally without wheezing or rales. ABDOMEN: Soft and non-tender with normal pitched bowel sounds.  MUSCULOSKELETAL: There are no major deformities or cyanosis. NEUROLOGIC: No focal weakness or paresthesias are detected. SKIN: There are no ulcers or rashes noted. PSYCHIATRIC: The patient has a normal affect.  DATA:   LEFT UPPER EXTREMITY VEIN MAP:  I have independently interpreted his left upper extreme vein. The forearm cephalic vein looks very small with branches. The upper arm cephalic vein looks reasonable in size. The basilic vein looks potentially usable also.   MEDICAL ISSUES:  END-STAGE RENAL DISEASE: The next logical place for access would be a left arm fistula or graft. Hopefully he'll be a candidate for a brachiocephalic fistula. Second option would be a basilic vein transposition. Third option would be an AV graft. I have discussed the procedure and potential complications and he is agreeable to proceed tomorrow.  [name] [facility] [place] [phone]

## 2016-01-14 ENCOUNTER — Ambulatory Visit (HOSPITAL_COMMUNITY)
Admission: RE | Admit: 2016-01-14 | Discharge: 2016-01-14 | Disposition: A | Discharge status: Home / Self Care | Payer: BLUE CROSS/BLUE SHIELD | Source: Ambulatory Visit | Attending: Vascular Surgery | Admitting: Vascular Surgery

## 2016-01-14 ENCOUNTER — Encounter (HOSPITAL_COMMUNITY)
Admission: RE | Disposition: A | Discharge status: Home / Self Care | Payer: Self-pay | Source: Ambulatory Visit | Attending: Vascular Surgery

## 2016-01-14 ENCOUNTER — Other Ambulatory Visit: Payer: Self-pay | Admitting: *Deleted

## 2016-01-14 ENCOUNTER — Ambulatory Visit (HOSPITAL_COMMUNITY): Payer: BLUE CROSS/BLUE SHIELD | Admitting: Certified Registered Nurse Anesthetist

## 2016-01-14 ENCOUNTER — Encounter (HOSPITAL_COMMUNITY): Payer: Self-pay | Admitting: *Deleted

## 2016-01-14 ENCOUNTER — Telehealth: Payer: Self-pay | Admitting: Vascular Surgery

## 2016-01-14 DIAGNOSIS — E785 Hyperlipidemia, unspecified: Secondary | ICD-10-CM | POA: Diagnosis not present

## 2016-01-14 DIAGNOSIS — N185 Chronic kidney disease, stage 5: Secondary | ICD-10-CM | POA: Diagnosis not present

## 2016-01-14 DIAGNOSIS — I132 Hypertensive heart and chronic kidney disease with heart failure and with stage 5 chronic kidney disease, or end stage renal disease: Secondary | ICD-10-CM | POA: Diagnosis present

## 2016-01-14 DIAGNOSIS — Z95810 Presence of automatic (implantable) cardiac defibrillator: Secondary | ICD-10-CM | POA: Diagnosis not present

## 2016-01-14 DIAGNOSIS — Z7982 Long term (current) use of aspirin: Secondary | ICD-10-CM | POA: Diagnosis not present

## 2016-01-14 DIAGNOSIS — E1140 Type 2 diabetes mellitus with diabetic neuropathy, unspecified: Secondary | ICD-10-CM | POA: Insufficient documentation

## 2016-01-14 DIAGNOSIS — E1121 Type 2 diabetes mellitus with diabetic nephropathy: Secondary | ICD-10-CM | POA: Diagnosis not present

## 2016-01-14 DIAGNOSIS — Z4931 Encounter for adequacy testing for hemodialysis: Secondary | ICD-10-CM

## 2016-01-14 DIAGNOSIS — J449 Chronic obstructive pulmonary disease, unspecified: Secondary | ICD-10-CM | POA: Diagnosis not present

## 2016-01-14 DIAGNOSIS — Z992 Dependence on renal dialysis: Secondary | ICD-10-CM | POA: Insufficient documentation

## 2016-01-14 DIAGNOSIS — Z794 Long term (current) use of insulin: Secondary | ICD-10-CM | POA: Diagnosis not present

## 2016-01-14 DIAGNOSIS — M1990 Unspecified osteoarthritis, unspecified site: Secondary | ICD-10-CM | POA: Insufficient documentation

## 2016-01-14 DIAGNOSIS — I429 Cardiomyopathy, unspecified: Secondary | ICD-10-CM | POA: Diagnosis not present

## 2016-01-14 DIAGNOSIS — E1151 Type 2 diabetes mellitus with diabetic peripheral angiopathy without gangrene: Secondary | ICD-10-CM | POA: Insufficient documentation

## 2016-01-14 DIAGNOSIS — E1122 Type 2 diabetes mellitus with diabetic chronic kidney disease: Secondary | ICD-10-CM | POA: Insufficient documentation

## 2016-01-14 DIAGNOSIS — I5022 Chronic systolic (congestive) heart failure: Secondary | ICD-10-CM | POA: Diagnosis not present

## 2016-01-14 DIAGNOSIS — N186 End stage renal disease: Secondary | ICD-10-CM | POA: Diagnosis not present

## 2016-01-14 DIAGNOSIS — Z87891 Personal history of nicotine dependence: Secondary | ICD-10-CM | POA: Insufficient documentation

## 2016-01-14 DIAGNOSIS — I2510 Atherosclerotic heart disease of native coronary artery without angina pectoris: Secondary | ICD-10-CM | POA: Insufficient documentation

## 2016-01-14 HISTORY — PX: AV FISTULA PLACEMENT: SHX1204

## 2016-01-14 HISTORY — DX: Personal history of urinary calculi: Z87.442

## 2016-01-14 LAB — POCT I-STAT 4, (NA,K, GLUC, HGB,HCT)
Glucose, Bld: 102 mg/dL — ABNORMAL HIGH (ref 65–99)
HCT: 33 % — ABNORMAL LOW (ref 39.0–52.0)
Hemoglobin: 11.2 g/dL — ABNORMAL LOW (ref 13.0–17.0)
Potassium: 4.5 mmol/L (ref 3.5–5.1)
Sodium: 138 mmol/L (ref 135–145)

## 2016-01-14 LAB — GLUCOSE, CAPILLARY: Glucose-Capillary: 118 mg/dL — ABNORMAL HIGH (ref 65–99)

## 2016-01-14 SURGERY — INSERTION OF ARTERIOVENOUS (AV) GORE-TEX GRAFT ARM
Anesthesia: Monitor Anesthesia Care | Site: Arm Upper | Laterality: Left

## 2016-01-14 MED ORDER — MIDAZOLAM HCL 5 MG/5ML IJ SOLN
INTRAMUSCULAR | Status: DC | PRN
  Administered 2016-01-14: 2 mg via INTRAVENOUS

## 2016-01-14 MED ORDER — THROMBIN 20000 UNITS EX SOLR
CUTANEOUS | Status: AC
  Filled 2016-01-14: qty 20000

## 2016-01-14 MED ORDER — DEXTROSE 5 % IV SOLN
1.5000 g | INTRAVENOUS | Status: AC
  Administered 2016-01-14: 1500 mg via INTRAVENOUS
  Filled 2016-01-14: qty 1.5

## 2016-01-14 MED ORDER — PHENYLEPHRINE HCL 10 MG/ML IJ SOLN
INTRAVENOUS | Status: DC | PRN
  Administered 2016-01-14: 20 ug/min via INTRAVENOUS

## 2016-01-14 MED ORDER — OXYCODONE HCL 5 MG/5ML PO SOLN: 5.0000 mg | Freq: Once | ORAL | Status: DC | PRN

## 2016-01-14 MED ORDER — FENTANYL CITRATE (PF) 100 MCG/2ML IJ SOLN: 25.0000 ug | INTRAMUSCULAR | Status: DC | PRN

## 2016-01-14 MED ORDER — LIDOCAINE-EPINEPHRINE (PF) 1 %-1:200000 IJ SOLN
INTRAMUSCULAR | Status: DC | PRN
  Administered 2016-01-14: 30 mL

## 2016-01-14 MED ORDER — HYDROCODONE-ACETAMINOPHEN 5-325 MG PO TABS: 1.0000 | ORAL_TABLET | Freq: Four times a day (QID) | ORAL | 0 refills | Status: DC | PRN

## 2016-01-14 MED ORDER — FENTANYL CITRATE (PF) 100 MCG/2ML IJ SOLN
INTRAMUSCULAR | Status: AC
  Filled 2016-01-14: qty 2

## 2016-01-14 MED ORDER — OXYCODONE HCL 5 MG PO TABS: 5.0000 mg | ORAL_TABLET | Freq: Once | ORAL | Status: DC | PRN

## 2016-01-14 MED ORDER — PROPOFOL 500 MG/50ML IV EMUL
INTRAVENOUS | Status: DC | PRN
  Administered 2016-01-14: 50 ug/kg/min via INTRAVENOUS

## 2016-01-14 MED ORDER — FENTANYL CITRATE (PF) 100 MCG/2ML IJ SOLN
INTRAMUSCULAR | Status: DC | PRN
  Administered 2016-01-14: 100 ug via INTRAVENOUS

## 2016-01-14 MED ORDER — CHLORHEXIDINE GLUCONATE CLOTH 2 % EX PADS
6.0000 | MEDICATED_PAD | Freq: Once | CUTANEOUS | Status: DC
Start: 2016-01-14 — End: 2016-01-14

## 2016-01-14 MED ORDER — LIDOCAINE-EPINEPHRINE (PF) 1 %-1:200000 IJ SOLN
INTRAMUSCULAR | Status: AC
  Filled 2016-01-14: qty 30

## 2016-01-14 MED ORDER — ONDANSETRON HCL 4 MG/2ML IJ SOLN
INTRAMUSCULAR | Status: DC | PRN
  Administered 2016-01-14: 40 mg via INTRAVENOUS

## 2016-01-14 MED ORDER — PROPOFOL 10 MG/ML IV BOLUS
INTRAVENOUS | Status: AC
  Filled 2016-01-14: qty 60

## 2016-01-14 MED ORDER — 0.9 % SODIUM CHLORIDE (POUR BTL) OPTIME
TOPICAL | Status: DC | PRN
  Administered 2016-01-14: 1000 mL

## 2016-01-14 MED ORDER — SODIUM CHLORIDE 0.9 % IV SOLN
INTRAVENOUS | Status: DC
  Administered 2016-01-14 (×2): via INTRAVENOUS

## 2016-01-14 MED ORDER — HEPARIN SODIUM (PORCINE) 1000 UNIT/ML IJ SOLN
INTRAMUSCULAR | Status: DC | PRN
  Administered 2016-01-14: 7000 [IU] via INTRAVENOUS

## 2016-01-14 MED ORDER — MIDAZOLAM HCL 2 MG/2ML IJ SOLN
INTRAMUSCULAR | Status: AC
  Filled 2016-01-14: qty 2

## 2016-01-14 MED ORDER — PROTAMINE SULFATE 10 MG/ML IV SOLN
INTRAVENOUS | Status: DC | PRN
  Administered 2016-01-14: 40 mg via INTRAVENOUS

## 2016-01-14 MED ORDER — PROPOFOL 10 MG/ML IV BOLUS
INTRAVENOUS | Status: AC
  Filled 2016-01-14: qty 20

## 2016-01-14 MED ORDER — SODIUM CHLORIDE 0.9 % IV SOLN
INTRAVENOUS | Status: DC | PRN
  Administered 2016-01-14: 10:00:00

## 2016-01-14 MED ORDER — LIDOCAINE HCL (PF) 1 % IJ SOLN
INTRAMUSCULAR | Status: AC
  Filled 2016-01-14: qty 30

## 2016-01-14 SURGICAL SUPPLY — 31 items
ADH SKN CLS APL DERMABOND .7 (GAUZE/BANDAGES/DRESSINGS) ×1
ARMBAND PINK RESTRICT EXTREMIT (MISCELLANEOUS) ×4 IMPLANT
CANISTER SUCTION 2500CC (MISCELLANEOUS) ×3 IMPLANT
CANNULA VESSEL 3MM 2 BLNT TIP (CANNULA) ×3 IMPLANT
CLIP TI MEDIUM 6 (CLIP) ×3 IMPLANT
CLIP TI WIDE RED SMALL 24 (CLIP) ×2 IMPLANT
CLIP TI WIDE RED SMALL 6 (CLIP) ×3 IMPLANT
DECANTER SPIKE VIAL GLASS SM (MISCELLANEOUS) ×3 IMPLANT
DERMABOND ADVANCED (GAUZE/BANDAGES/DRESSINGS) ×2
DERMABOND ADVANCED .7 DNX12 (GAUZE/BANDAGES/DRESSINGS) ×1 IMPLANT
ELECT REM PT RETURN 9FT ADLT (ELECTROSURGICAL) ×3
ELECTRODE REM PT RTRN 9FT ADLT (ELECTROSURGICAL) ×1 IMPLANT
GLOVE BIO SURGEON STRL SZ7.5 (GLOVE) ×3 IMPLANT
GLOVE BIOGEL PI IND STRL 7.0 (GLOVE) IMPLANT
GLOVE BIOGEL PI IND STRL 8 (GLOVE) ×1 IMPLANT
GLOVE BIOGEL PI INDICATOR 7.0 (GLOVE) ×4
GLOVE BIOGEL PI INDICATOR 8 (GLOVE) ×2
GOWN STRL REUS W/ TWL LRG LVL3 (GOWN DISPOSABLE) ×3 IMPLANT
GOWN STRL REUS W/TWL LRG LVL3 (GOWN DISPOSABLE) ×9
KIT BASIN OR (CUSTOM PROCEDURE TRAY) ×3 IMPLANT
KIT ROOM TURNOVER OR (KITS) ×3 IMPLANT
NS IRRIG 1000ML POUR BTL (IV SOLUTION) ×3 IMPLANT
PACK CV ACCESS (CUSTOM PROCEDURE TRAY) ×3 IMPLANT
PAD ARMBOARD 7.5X6 YLW CONV (MISCELLANEOUS) ×6 IMPLANT
SPONGE SURGIFOAM ABS GEL 100 (HEMOSTASIS) IMPLANT
SUT PROLENE 6 0 BV (SUTURE) ×6 IMPLANT
SUT VIC AB 3-0 SH 27 (SUTURE) ×6
SUT VIC AB 3-0 SH 27X BRD (SUTURE) ×2 IMPLANT
SUT VICRYL 4-0 PS2 18IN ABS (SUTURE) ×6 IMPLANT
UNDERPAD 30X30 (UNDERPADS AND DIAPERS) ×3 IMPLANT
WATER STERILE IRR 1000ML POUR (IV SOLUTION) ×3 IMPLANT

## 2016-01-14 NOTE — H&P (View-Only) (Signed)
Patient name: [name] MRN: [id number] DOB: 04-03-1960 Sex: male  REASON FOR VISIT: Evaluate for hemodialysis access.  HPI: Anthony Bullock is a 56 y.o. male who I saw on 08/19/2015. He had a right basilic vein transposition done on 08/22/2014. On 01/26/2015 he had venoplasty of his fistula. His fistula subsequently occluded. We had discussed placing a fistula in the left arm although he favored placement of an AV graft as he had been frustrated with his fistulas. Therefore an AV graft was placed in the right arm on 09/08/2015. This was a 4-7 mm right upper arm graft. This subsequently occluded and is not salvageable. He states that he has had a catheter for 3 weeks now. He comes in for evaluation for new access.  Of note he had an AICD placed in 2015 but I cannot find the operative report. However this does not go through the left subclavian vein. I reviewed his most recent chest x-ray from November 2017. Dr. Caryl Comes placed his AICD knowing that he would ultimately require access in the left arm.  He dialyzes Monday Wednesdays and Fridays with his right IJ tunneled dialysis catheter.  Past Medical History:  Diagnosis Date  . 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 Allergic rhinitis   . Anemia   . Arthritis   . Chronic systolic heart failure ([place])    a. ECHO (12/2012) EF 25-30%, HK entireanteroseptal myocardium //  b.  EF 25%, diffuse HK, grade 1 diastolic dysfunction, MAC, mild LAE, normal RVSF, trivial pericardial effusion  . COPD (chronic obstructive pulmonary disease) ([place])   . Diabetes mellitus type II   . Diabetic nephropathy ([place])   . Diabetic neuropathy ([place])   . ESRD on hemodialysis [facility])    started HD June 2017, goes to [facility] HD unit, Dr Hinda Lenis  . History of cardiac catheterization    a.Myoview 1/15:  There is significant left ventricular dysfunction. There may be slight scar at the apex. There is no significant ischemia. LV Ejection  Fraction: 27%  //  b. RHC/LHC (1/15) with mean RA 6, PA 47/22 mean 33, mean PCWP 20, PVR 2.5 WU, CI 2.5; 80% dLAD stenosis, 70% diffuse large D.   . Hyperlipidemia   . Hypertension   . Kidney stones   . NICM (nonischemic cardiomyopathy) ([place])    Primarily nonischemic. Echo (12/14) with EF 25-30%. Echo (3/15) with EF 25%, mild to moderately dilated LV, normal RV size and systolic function.   . Pneumonia   . Urethral stricture     Family History  Problem Relation Age of Onset  . Bladder Cancer Mother   . Alcohol abuse Father   . Melanoma Father   . Stroke Maternal Grandmother   . Heart Problems Maternal Grandmother     unknown  . Diabetes Maternal Grandmother   . Heart disease Maternal Grandfather   . Prostate cancer Maternal Grandfather     SOCIAL HISTORY: Social History  Substance Use Topics  . Smoking status: Former Smoker    Packs/day: 2.00    Years: 32.00    Types: Cigarettes    Quit date: 05/11/2010  . Smokeless tobacco: Never Used  . Alcohol use No    Allergies  Allergen Reactions  . Morphine Sulfate Rash and Other (See Comments)    REACTION: Itches all over, red spots    Current Outpatient Prescriptions  Medication Sig Dispense Refill  . aspirin EC 81 MG tablet Take 81 mg by mouth daily.     [name]  atorvastatin (LIPITOR) 80 MG tablet TAKE 1 TABLET AT BEDTIME 90 tablet 3  . azelastine (ASTELIN) 0.1 % nasal spray Place 2 sprays into both nostrils 3 (three) times daily as needed for rhinitis. Use in each nostril as directed 30 mL 12  . BD PEN NEEDLE NANO U/F 32G X 4 MM MISC USE ONCE DAILY 90 each 2  . carvedilol (COREG) 12.5 MG tablet Take 1 tablet (12.5 mg total) by mouth 2 (two) times daily with a meal. 60 tablet 0  . fenofibrate 160 MG tablet TAKE 1 TABLET DAILY 90 tablet 3  . fexofenadine (ALLEGRA) 180 MG tablet Take 180 mg by mouth daily.    [name] gabapentin (NEURONTIN) 300 MG capsule Take 1 capsule (300 mg total) by mouth 3 (three) times daily. 270 capsule 3  .  glucose blood test strip Check 1 time daily. E11.9 One Touch Ultra Blue Test Strips 100 each 3  . HYDROcodone-acetaminophen (NORCO/VICODIN) 5-325 MG tablet Take 1 tablet by mouth every 6 (six) hours as needed for moderate pain. 10 tablet 0  . Insulin Glargine (LANTUS SOLOSTAR) 100 UNIT/ML Solostar Pen Inject 40 Units into the skin daily at 10 pm.     . insulin lispro (HUMALOG) 100 UNIT/ML injection Inject 0-14 Units into the skin 3 (three) times daily as needed for high blood sugar (per sliding scale).    [name] lanthanum (FOSRENOL) 1000 MG chewable tablet Chew 1,000 mg by mouth. Take one tablet after each meal.    . mupirocin ointment (BACTROBAN) 2 % Place 1 application into the nose 2 (two) times daily as needed. 22 g 2  . Olopatadine HCl (PATADAY) 0.2 % SOLN Place 1 drop into both eyes daily as needed (for allergies).      No current facility-administered medications for this visit.     REVIEW OF SYSTEMS:  _0  denotes positive finding, _1  denotes negative finding Cardiac  Comments:  Chest pain or chest pressure:    Shortness of breath upon exertion:    Short of breath when lying flat:    Irregular heart rhythm:        Vascular    Pain in calf, thigh, or hip brought on by ambulation:    Pain in feet at night that wakes you up from your sleep:     Blood clot in your veins:    Leg swelling:         Pulmonary    Oxygen at home:    Productive cough:     Wheezing:         Neurologic    Sudden weakness in arms or legs:     Sudden numbness in arms or legs:     Sudden onset of difficulty speaking or slurred speech:    Temporary loss of vision in one eye:     Problems with dizziness:         Gastrointestinal    Blood in stool:     Vomited blood:         Genitourinary    Burning when urinating:     Blood in urine:        Psychiatric    Major depression:         Hematologic    Bleeding problems:    Problems with blood clotting too easily:        Skin    Rashes or ulcers:         Constitutional    Fever or chills:  PHYSICAL EXAM: Vitals:   01/13/16 1621  BP: 139/76  Pulse: 82  Resp: 16  Temp: 97.9 F (36.6 C)  TempSrc: Oral  SpO2: 100%  Weight: 198 lb (89.8 kg)  Height: _0  (1.778 m)    GENERAL: The patient is a well-nourished male, in no acute distress. The vital signs are documented above. CARDIAC: There is a regular rate and rhythm.  VASCULAR: He has a diminished radial pulse on the left. He does have a biphasic radial and ulnar signal with the Doppler.  PULMONARY: There is good air exchange bilaterally without wheezing or rales. ABDOMEN: Soft and non-tender with normal pitched bowel sounds.  MUSCULOSKELETAL: There are no major deformities or cyanosis. NEUROLOGIC: No focal weakness or paresthesias are detected. SKIN: There are no ulcers or rashes noted. PSYCHIATRIC: The patient has a normal affect.  DATA:   LEFT UPPER EXTREMITY VEIN MAP:  I have independently interpreted his left upper extreme vein. The forearm cephalic vein looks very small with branches. The upper arm cephalic vein looks reasonable in size. The basilic vein looks potentially usable also.   MEDICAL ISSUES:  END-STAGE RENAL DISEASE: The next logical place for access would be a left arm fistula or graft. Hopefully he'll be a candidate for a brachiocephalic fistula. Second option would be a basilic vein transposition. Third option would be an AV graft. I have discussed the procedure and potential complications and he is agreeable to proceed tomorrow.  Anthony Bullock [facility] [phone]

## 2016-01-14 NOTE — Telephone Encounter (Signed)
-----   Message from Mena Goes, RN sent at 01/14/2016 10:35 AM EST ----- Regarding: 4-6 weeks w/ duplex   ----- Message ----- From: Alvia Grove, PA-C Sent: 01/14/2016  10:32 AM To: Vvs Charge Pool  S/p left brachial-cephalic AV fistula 01/18/31  F/u with Dr. Scot Dock in 4-6 weeks with duplex  Thanks Maudie Mercury

## 2016-01-14 NOTE — Telephone Encounter (Signed)
LVM on home # for appt date and time, mailed lttr 01/14/16 bg

## 2016-01-14 NOTE — Interval H&P Note (Signed)
History and Physical Interval Note:  01/14/2016 9:02 AM  Anthony Bullock  has presented today for surgery, with the diagnosis of Clotted hemodialysis graft  The various methods of treatment have been discussed with the patient and family. After consideration of risks, benefits and other options for treatment, the patient has consented to  Procedure(s): CREATION OF LEFT UPPER ARM ARTERIOVENOUS FISTULA VERSUS INSERTION OF ARTERIOVENOUS (AV) GORE-TEX GRAFT ARM (Left) as a surgical intervention .  The patient's history has been reviewed, patient examined, no change in status, stable for surgery.  I have reviewed the patient's chart and labs.  Questions were answered to the patient's satisfaction.     [name]

## 2016-01-14 NOTE — Transfer of Care (Signed)
Immediate Anesthesia Transfer of Care Note  Patient: Anthony Bullock  Procedure(s) Performed: Procedure(s): CREATION OF LEFT UPPER ARM ARTERIOVENOUS FISTULA (Left)  Patient Location: PACU  Anesthesia Type:MAC  Level of Consciousness: awake, alert , oriented and patient cooperative  Airway & Oxygen Therapy: spontaneously breathing with o2 mask   Post-op Assessment: Report given to RN and Post -op Vital signs reviewed and stable  Post vital signs: Reviewed and stable  Last Vitals:  Vitals:   01/14/16 0753  BP: 122/63  Pulse: 79  Resp: 18  Temp: 36.8 C    Last Pain:  Vitals:   01/14/16 0753  TempSrc: Oral      Patients Stated Pain Goal: 2 (49/70/26 3785)  Complications: No apparent anesthesia complications

## 2016-01-14 NOTE — Progress Notes (Signed)
Report given to caroline rn as caregiver 

## 2016-01-14 NOTE — Anesthesia Preprocedure Evaluation (Signed)
Anesthesia Evaluation  Patient identified by MRN, date of birth, ID band Patient awake    Reviewed: Allergy & Precautions, NPO status , Patient's Chart, lab work & pertinent test results  History of Anesthesia Complications Negative for: history of anesthetic complications  Airway Mallampati: II  TM Distance: >3 FB Neck ROM: Full    Dental  (+) Teeth Intact   Pulmonary pneumonia, COPD, former smoker,    breath sounds clear to auscultation- rhonchi       Cardiovascular hypertension, Pt. on medications and Pt. on home beta blockers + CAD, + Peripheral Vascular Disease and +CHF  + Cardiac Defibrillator  Rhythm:Regular     Neuro/Psych PSYCHIATRIC DISORDERS  Neuromuscular disease    GI/Hepatic negative GI ROS, Neg liver ROS,   Endo/Other  diabetes, Type 2  Renal/GU Renal disease     Musculoskeletal  (+) Arthritis ,   Abdominal   Peds  Hematology  (+) anemia ,   Anesthesia Other Findings   Reproductive/Obstetrics                            Anesthesia Physical Anesthesia Plan  ASA: III  Anesthesia Plan: MAC   Post-op Pain Management:    Induction:   Airway Management Planned: Natural Airway, Nasal Cannula and Simple Face Mask  Additional Equipment: None  Intra-op Plan:   Post-operative Plan:   Informed Consent: I have reviewed the patients History and Physical, chart, labs and discussed the procedure including the risks, benefits and alternatives for the proposed anesthesia with the patient or authorized representative who has indicated his/her understanding and acceptance.   Dental advisory given  Plan Discussed with: CRNA and Surgeon  Anesthesia Plan Comments:         Anesthesia Quick Evaluation

## 2016-01-14 NOTE — Op Note (Signed)
    NAME: Anthony Bullock   MRN: [id number] DOB: [birth year]    DATE OF OPERATION: 01/14/2016  PREOP DIAGNOSIS: Stage V chronic kidney disease  POSTOP DIAGNOSIS: Same  PROCEDURE: Left brachiocephalic AV fistula  SURGEON: Judeth Cornfield. Scot Dock, MD, FACS  ASSIST: Silva Bandy, [facility]  ANESTHESIA: Gen.   EBL: Minimal  INDICATIONS: Anthony Bullock is a 56 y.o. male who presents for new access.  FINDINGS: 4 mm upper arm cephalic vein.  TECHNIQUE: The patient was taken to the operating room and sedated by anesthesia. The left upper extremity was prepped and draped in usual sterile fashion. After the skin was anesthetized with 1% lidocaine, a transverse incision was made just above the antecubital level. Here the cephalic vein was dissected free and ligated distally. It was gently irrigated up with heparinized saline. The brachial artery was dissected free beneath the fascia. The artery was clamped proximally and distally and longitudinal arteriotomy was made. The vein was sewn end-to-side to the artery using continuous 6-0 Prolene suture. At the completion was palpable thrill in the fistula. There was a radial and ulnar signal with the Doppler. Hemostasis was obtained in the wound and the heparin was partially reversed with protamine. The wound was closed with a deep layer of 3-0 Vicryl and the skin closed with 4-0 Vicryl. Dermabond was applied. The patient tolerated the procedure well and was transferred to the recovery room in stable condition. All needle and sponge counts were correct.  [name], MD, FACS [facility]  DATE OF DICTATION:   01/14/2016

## 2016-01-15 ENCOUNTER — Encounter (HOSPITAL_COMMUNITY): Payer: Self-pay | Admitting: Vascular Surgery

## 2016-01-20 NOTE — Anesthesia Postprocedure Evaluation (Addendum)
Anesthesia Post Note  Patient: Anthony Bullock  Procedure(s) Performed: Procedure(s) (LRB): CREATION OF LEFT UPPER ARM ARTERIOVENOUS FISTULA (Left)  Patient location during evaluation: PACU Anesthesia Type: MAC Level of consciousness: awake and alert Pain management: pain level controlled Vital Signs Assessment: post-procedure vital signs reviewed and stable Respiratory status: spontaneous breathing, respiratory function stable and patient connected to nasal cannula oxygen Cardiovascular status: stable, blood pressure returned to baseline and unstable Anesthetic complications: no       Last Vitals:  Vitals:   01/14/16 1108 01/14/16 1115  BP: (!) 109/44 (!) 120/45  Pulse: 72 75  Resp: 15   Temp: 36.4 C     Last Pain:  Vitals:   01/14/16 1115  TempSrc:   PainSc: 0-No pain                 Rodolphe Edmonston

## 2016-03-02 ENCOUNTER — Encounter (HOSPITAL_COMMUNITY): Payer: BLUE CROSS/BLUE SHIELD

## 2016-03-02 ENCOUNTER — Encounter: Payer: BLUE CROSS/BLUE SHIELD | Admitting: Vascular Surgery

## 2016-03-08 ENCOUNTER — Encounter: Payer: Self-pay | Admitting: Vascular Surgery

## 2016-03-15 ENCOUNTER — Telehealth: Payer: Self-pay | Admitting: Family Medicine

## 2016-03-15 MED ORDER — GABAPENTIN 300 MG PO CAPS: 300.0000 mg | ORAL_CAPSULE | Freq: Three times a day (TID) | ORAL | 2 refills | Status: DC

## 2016-03-15 NOTE — Telephone Encounter (Signed)
Pt request refill   gabapentin (NEURONTIN) 300 MG capsule  90 day  [facility]/ [place]  *Please note new pharmacy.

## 2016-03-15 NOTE — Telephone Encounter (Signed)
Rx sent 

## 2016-03-16 ENCOUNTER — Encounter: Payer: BLUE CROSS/BLUE SHIELD | Admitting: Vascular Surgery

## 2016-03-16 ENCOUNTER — Encounter (HOSPITAL_COMMUNITY): Payer: BLUE CROSS/BLUE SHIELD

## 2016-03-17 ENCOUNTER — Encounter: Payer: Self-pay | Admitting: Family Medicine

## 2016-03-17 ENCOUNTER — Other Ambulatory Visit: Payer: Self-pay | Admitting: Vascular Surgery

## 2016-03-17 ENCOUNTER — Ambulatory Visit (INDEPENDENT_AMBULATORY_CARE_PROVIDER_SITE_OTHER): Payer: BLUE CROSS/BLUE SHIELD | Admitting: Family Medicine

## 2016-03-17 VITALS — BP 120/68 | HR 86 | Temp 98.3°F | Ht 70.0 in | Wt 197.0 lb

## 2016-03-17 DIAGNOSIS — Z8619 Personal history of other infectious and parasitic diseases: Secondary | ICD-10-CM | POA: Diagnosis not present

## 2016-03-17 DIAGNOSIS — N186 End stage renal disease: Secondary | ICD-10-CM

## 2016-03-17 DIAGNOSIS — S98131A Complete traumatic amputation of one right lesser toe, initial encounter: Secondary | ICD-10-CM | POA: Insufficient documentation

## 2016-03-17 DIAGNOSIS — Z992 Dependence on renal dialysis: Secondary | ICD-10-CM

## 2016-03-17 DIAGNOSIS — Z89421 Acquired absence of other right toe(s): Secondary | ICD-10-CM | POA: Diagnosis not present

## 2016-03-17 DIAGNOSIS — R509 Fever, unspecified: Secondary | ICD-10-CM

## 2016-03-17 DIAGNOSIS — I70261 Atherosclerosis of native arteries of extremities with gangrene, right leg: Secondary | ICD-10-CM

## 2016-03-17 LAB — POCT URINALYSIS DIPSTICK
Bilirubin, UA: NEGATIVE
Glucose, UA: 250
Ketones, UA: 5
Nitrite, UA: NEGATIVE
Spec Grav, UA: 1.02
Urobilinogen, UA: 0.2
pH, UA: 5

## 2016-03-17 LAB — POC INFLUENZA A&B (BINAX/QUICKVUE)
Influenza A, POC: NEGATIVE
Influenza B, POC: NEGATIVE

## 2016-03-17 NOTE — Progress Notes (Signed)
Pre visit review using our clinic review tool, if applicable. No additional management support is needed unless otherwise documented below in the visit note. 

## 2016-03-17 NOTE — Addendum Note (Signed)
Addended by: Agnes Lawrence on: 03/17/2016 04:52 PM   Modules accepted: Orders

## 2016-03-17 NOTE — Patient Instructions (Addendum)
BEFORE YOU LEAVE: -flu test -BP -xray sheet -urine dip -follow up: next week with PCP  Follow up with your orthopedic doc if any further fevers or wound concerns.  I hope you are feeling better soon! Seek care immediately if worsening, new concerns or you are not improving with treatment.

## 2016-03-17 NOTE — Addendum Note (Signed)
Addended by: Agnes Lawrence on: 03/17/2016 04:39 PM   Modules accepted: Orders

## 2016-03-17 NOTE — Addendum Note (Signed)
Addended by: Agnes Lawrence on: 03/17/2016 04:28 PM   Modules accepted: Orders

## 2016-03-17 NOTE — Addendum Note (Signed)
Addended by: Lucretia Kern on: 03/17/2016 04:49 PM   Modules accepted: Orders

## 2016-03-17 NOTE — Addendum Note (Signed)
Addended by: Elmer Picker on: 03/17/2016 05:04 PM   Modules accepted: Orders

## 2016-03-17 NOTE — Progress Notes (Addendum)
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 5 days ago -finished vanc 3 days ago - had fever after last dose vanc given with his dialysis (102 per spouse) - fevers seem to occur with the vanc with dialysis -then had another low grade temp last night after dialysis (around 100) -other then his toe, feels fine -no cough, sore throat, SOB, rash, abd pain, joint pains or pain elsewhere, dysuria, vomiting, diarrhea or body aches -saw ortho 2 days ago and they said concern with toe as is healing poorly, but that he should see PCP because fever may not be coming from foot and might be a virus  ROS: See pertinent positives and negatives per HPI.  Past Medical History:  Diagnosis Date  . 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 Allergic rhinitis   . Anemia   . Arthritis   . Chronic systolic heart failure ([place])    a. ECHO (12/2012) EF 25-30%, HK entireanteroseptal myocardium //  b.  EF 25%, diffuse HK, grade 1 diastolic dysfunction, MAC, mild LAE, normal RVSF, trivial pericardial effusion  . COPD (chronic obstructive pulmonary disease) ([place])   . Diabetes mellitus type II   . Diabetic nephropathy ([place])   . Diabetic neuropathy ([place])   . ESRD on hemodialysis [facility])    started HD June 2017, goes to [facility] HD unit, Dr Hinda Lenis  . History of cardiac catheterization    a.Myoview 1/15:  There is significant left ventricular dysfunction. There may be slight scar at the apex. There is no significant ischemia. LV Ejection Fraction: 27%  //  b. RHC/LHC (1/15) with mean RA 6, PA 47/22 mean 33, mean PCWP 20, PVR 2.5 WU, CI 2.5; 80% dLAD stenosis, 70% diffuse large D.   . History of kidney stones   . Hyperlipidemia   . Hypertension   . Kidney stones   . NICM (nonischemic cardiomyopathy) ([place])     Primarily nonischemic. Echo (12/14) with EF 25-30%. Echo (3/15) with EF 25%, mild to moderately dilated LV, normal RV size and systolic function.   . Pneumonia   . Urethral stricture     Past Surgical History:  Procedure Laterality Date  . AV FISTULA PLACEMENT Right 09/08/2015   Procedure: INSERTION OF 4-13m x 45cm  ARTERIOVENOUS (AV) GORE-TEX GRAFT RIGHT UPPER  ARM;  Surgeon: CAngelia Mould MD;  [place]  Service: Vascular;  Laterality: Right;  . AV FISTULA PLACEMENT Left 01/14/2016   Procedure: CREATION OF LEFT UPPER ARM ARTERIOVENOUS FISTULA;  Surgeon: CAngelia Mould MD;  [place]  Service: Vascular;  Laterality: Left;  . BASCILIC VEIN TRANSPOSITION Right 08/22/2014   Procedure: RIGHT UPPER ARM BASCILIC VEIN TRANSPOSITION;  Surgeon: CAngelia Mould MD;  [place]  Service: Vascular;  Laterality: Right;  . CARDIAC CATHETERIZATION    . CARDIAC DEFIBRILLATOR PLACEMENT  06/27/2013   Sub Q       BY DR KCaryl Comes . COLONOSCOPY WITH PROPOFOL N/A 07/22/2015   Procedure: COLONOSCOPY WITH PROPOFOL;  Surgeon: HDoran Stabler MD;  [facility];  Service: Gastroenterology;  Laterality: N/A;  . HERNIA REPAIR    . IMPLANTABLE CARDIOVERTER DEFIBRILLATOR IMPLANT N/A 06/27/2013   Procedure: SUB Q ICD;  Surgeon: SDeboraha Sprang MD;  [facility];  Service:  Cardiovascular;  Laterality: N/A;  . IR GENERIC HISTORICAL Right 11/30/2015   IR THROMBECTOMY AV FISTULA W/THROMBOLYSIS/PTA INC/SHUNT/IMG RIGHT 11/30/2015 Aletta Edouard, MD [facility]  . IR GENERIC HISTORICAL  11/30/2015   IR US GUIDE VASC ACCESS RIGHT 11/30/2015 Aletta Edouard, MD [facility]  . IR GENERIC HISTORICAL Right 12/15/2015   IR THROMBECTOMY AV FISTULA W/THROMBOLYSIS/PTA/STENT INC/SHUNT/IMG RT 12/15/2015 Arne Cleveland, MD [facility]  . IR GENERIC HISTORICAL  12/15/2015   IR US GUIDE VASC ACCESS RIGHT 12/15/2015 Arne Cleveland, MD [facility]  . IR GENERIC HISTORICAL   12/28/2015   IR FLUORO GUIDE CV LINE RIGHT 12/28/2015 Marybelle Killings, MD [facility]  . IR GENERIC HISTORICAL  12/28/2015   IR US GUIDE VASC ACCESS RIGHT 12/28/2015 Marybelle Killings, MD [facility]  . LEFT A ND RIGHT HEART CATH  01/30/2013   DR Sung Amabile  . LEFT AND RIGHT HEART CATHETERIZATION WITH CORONARY ANGIOGRAM N/A 01/30/2013   Procedure: LEFT AND RIGHT HEART CATHETERIZATION WITH CORONARY ANGIOGRAM;  Surgeon: Jolaine Artist, MD;  Location: [facility];  Service: Cardiovascular;  Laterality: N/A;  . PERIPHERAL VASCULAR CATHETERIZATION Right 01/26/2015   Procedure: A/V Fistulagram;  Surgeon: Angelia Mould, MD;  Location: [facility];  Service: Cardiovascular;  Laterality: Right;  . reapea urethral surgery for recurrent obstruction  2011  . TOTAL KNEE ARTHROPLASTY Right 2007    Family History  Problem Relation Age of Onset  . Bladder Cancer Mother   . Alcohol abuse Father   . Melanoma Father   . Stroke Maternal Grandmother   . Heart Problems Maternal Grandmother     unknown  . Diabetes Maternal Grandmother   . Heart disease Maternal Grandfather   . Prostate cancer Maternal Grandfather     Social History   Social History  . Marital status: Married    Spouse name: N/A  . Number of children: 0  . Years of education: N/A   Social History Main Topics  . Smoking status: Former Smoker    Packs/day: 2.00    Years: 32.00    Types: Cigarettes    Quit date: 05/11/2010  . Smokeless tobacco: Never Used  . Alcohol use No  . Drug use: No  . Sexual activity: Not Asked   Other Topics Concern  . 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  aspirin EC 81 MG tablet, Take 81 mg by mouth daily. , Disp: , Rfl:  .  atorvastatin (LIPITOR) 80 MG tablet, TAKE 1 TABLET AT BEDTIME, Disp: 90 tablet, Rfl: 3 .  azelastine (ASTELIN) 0.1 % nasal spray, Place 2 sprays into both nostrils 3 (three) times daily as needed  for rhinitis. Use in each nostril as directed, Disp: 30 mL, Rfl: 12 .  BD PEN NEEDLE NANO U/F 32G X 4 MM MISC, USE ONCE DAILY, Disp: 90 each, Rfl: 2 .  carvedilol (COREG) 12.5 MG tablet, Take 1 tablet (12.5 mg total) by mouth 2 (two) times daily with a meal., Disp: 60 tablet, Rfl: 0 .  fenofibrate 160 MG tablet, TAKE 1 TABLET DAILY, Disp: 90 tablet, Rfl: 3 .  fexofenadine (ALLEGRA) 180 MG tablet, Take 180 mg by mouth daily., Disp: , Rfl:  .  furosemide (LASIX) 40 MG tablet, Take 120 mg by mouth daily., Disp: , Rfl:  .  gabapentin (NEURONTIN) 300 MG capsule, Take 1 capsule (300 mg total) by mouth 3 (three) times daily.,  Disp: 270 capsule, Rfl: 2 .  glucose blood test strip, Check 1 time daily. E11.9 One Touch Ultra Blue Test Strips, Disp: 100 each, Rfl: 3 .  HYDROcodone-acetaminophen (NORCO/VICODIN) 5-325 MG tablet, Take 1 tablet by mouth every 6 (six) hours as needed for moderate pain., Disp: 6 tablet, Rfl: 0 .  Insulin Glargine (LANTUS SOLOSTAR) 100 UNIT/ML Solostar Pen, Inject 40 Units into the skin daily at 10 pm. , Disp: , Rfl:  .  insulin lispro (HUMALOG) 100 UNIT/ML injection, Inject 0-14 Units into the skin 3 (three) times daily as needed for high blood sugar (per sliding scale)., Disp: , Rfl:  .  lanthanum (FOSRENOL) 1000 MG chewable tablet, Chew 1,000 mg by mouth. Take one tablet after each meal., Disp: , Rfl:  .  mupirocin ointment (BACTROBAN) 2 %, Place 1 application into the nose 2 (two) times daily as needed., Disp: 22 g, Rfl: 2 .  Olopatadine HCl (PATADAY) 0.2 % SOLN, Place 1 drop into both eyes daily as needed (for allergies). , Disp: , Rfl:   EXAM:  Vitals:   03/17/16 1519  BP: 120/68  Pulse: 86  Temp: 98.3 F (36.8 C)    Body mass index is 28.27 kg/m.  GENERAL: vitals reviewed and listed above, alert, oriented, appears well hydrated and in no acute distress  HEENT: atraumatic, conjunttiva clear, no obvious abnormalities on inspection of external nose and ears, normal  appearance of ear canals and TMs,  no tonsillar edema or exudate, no sinus TTP  NECK: no obvious masses on inspection  LUNGS: clear to auscultation bilaterally, no wheezes, rales or rhonchi, good air movement  CV: HRRR, no peripheral edema  MS: moves all extremities without noticeable abnormality, bandage and wrapping on R foot and in post op shoe, no erythema or swelling knees, elbows, ankles, fingers  SKIN: no rash  PSYCH: pleasant and cooperative, no obvious depression or anxiety  ASSESSMENT AND PLAN:  Discussed the following assessment and plan:  Fever, unspecified fever cause - Plan: DG Chest 2 View, POC Influenza A&B (Binax test), POC Urinalysis Dipstick, Urine culture, CANCELED: CBC, CANCELED: CBC with Differential/Platelet, CANCELED: Culture, Blood, CANCELED: Culture, Blood, CANCELED: Urine Microscopic, CANCELED: Culture, Urine  Amputated toe of right foot (HCC)  ESRD (end stage renal disease) on dialysis ([place])  History of recent acute infection - Plan: CANCELED: Culture, Blood  -suspect most likely source of fever is his toe/or treatment related fever - has close follow up with his ortho doc -will check flu test, urine, CXR for eval other causes fever - no other symptoms to suggests other source or any signs sepsis/bacteremia - offered cbc and blood cultures as well but he declined/refused - reports he does this with dialysis and will do again in the morning -close follow up with PCP to reassess -advised he notify ortho of any further fevers or issues/concerns and seek care if persistent fevers or other new symptoms develop in interim -Patient advised to return or notify a doctor immediately if symptoms worsen or persist or new concerns arise.  Patient Instructions  BEFORE YOU LEAVE: -flu test -BP -xray sheet -urine dip -follow up: next week with PCP  Follow up with your orthopedic doc if any further fevers or wound concerns.  I hope you are feeling better  soon! Seek care immediately if worsening, new concerns or you are not improving with treatment.      Colin Benton R., DO

## 2016-03-17 NOTE — Addendum Note (Signed)
Addended by: Agnes Lawrence on: 03/17/2016 04:37 PM   Modules accepted: Orders

## 2016-03-17 NOTE — Addendum Note (Signed)
Addended by: Elmer Picker on: 03/17/2016 05:29 PM   Modules accepted: Orders

## 2016-03-18 ENCOUNTER — Encounter: Payer: Self-pay | Admitting: Family Medicine

## 2016-03-18 ENCOUNTER — Encounter: Payer: Self-pay | Admitting: Vascular Surgery

## 2016-03-18 ENCOUNTER — Telehealth: Payer: Self-pay | Admitting: Family Medicine

## 2016-03-18 ENCOUNTER — Encounter: Payer: Self-pay | Admitting: Cardiology

## 2016-03-18 NOTE — Telephone Encounter (Signed)
Wife called for results of pt's UA done yesterday. Please call when they are back.

## 2016-03-19 LAB — URINE CULTURE: Organism ID, Bacteria: NO GROWTH

## 2016-03-20 NOTE — Telephone Encounter (Signed)
See result note.  

## 2016-03-24 ENCOUNTER — Ambulatory Visit (HOSPITAL_COMMUNITY)
Admission: RE | Admit: 2016-03-24 | Discharge: 2016-03-24 | Disposition: A | Discharge status: Home / Self Care | Payer: BLUE CROSS/BLUE SHIELD | Source: Ambulatory Visit | Attending: Vascular Surgery | Admitting: Vascular Surgery

## 2016-03-24 DIAGNOSIS — R0989 Other specified symptoms and signs involving the circulatory and respiratory systems: Secondary | ICD-10-CM | POA: Diagnosis present

## 2016-03-24 DIAGNOSIS — I70261 Atherosclerosis of native arteries of extremities with gangrene, right leg: Secondary | ICD-10-CM

## 2016-03-29 ENCOUNTER — Encounter (HOSPITAL_COMMUNITY): Payer: Self-pay | Admitting: Emergency Medicine

## 2016-03-29 DIAGNOSIS — E1151 Type 2 diabetes mellitus with diabetic peripheral angiopathy without gangrene: Secondary | ICD-10-CM | POA: Diagnosis not present

## 2016-03-29 DIAGNOSIS — L089 Local infection of the skin and subcutaneous tissue, unspecified: Secondary | ICD-10-CM | POA: Diagnosis present

## 2016-03-29 DIAGNOSIS — E7800 Pure hypercholesterolemia, unspecified: Secondary | ICD-10-CM | POA: Diagnosis present

## 2016-03-29 DIAGNOSIS — K5900 Constipation, unspecified: Secondary | ICD-10-CM | POA: Diagnosis not present

## 2016-03-29 DIAGNOSIS — D631 Anemia in chronic kidney disease: Secondary | ICD-10-CM | POA: Diagnosis present

## 2016-03-29 DIAGNOSIS — L89312 Pressure ulcer of right buttock, stage 2: Secondary | ICD-10-CM | POA: Diagnosis not present

## 2016-03-29 DIAGNOSIS — Z7982 Long term (current) use of aspirin: Secondary | ICD-10-CM

## 2016-03-29 DIAGNOSIS — J449 Chronic obstructive pulmonary disease, unspecified: Secondary | ICD-10-CM | POA: Diagnosis present

## 2016-03-29 DIAGNOSIS — I428 Other cardiomyopathies: Secondary | ICD-10-CM | POA: Diagnosis present

## 2016-03-29 DIAGNOSIS — Z794 Long term (current) use of insulin: Secondary | ICD-10-CM

## 2016-03-29 DIAGNOSIS — N186 End stage renal disease: Secondary | ICD-10-CM | POA: Diagnosis present

## 2016-03-29 DIAGNOSIS — D62 Acute posthemorrhagic anemia: Secondary | ICD-10-CM | POA: Diagnosis not present

## 2016-03-29 DIAGNOSIS — L97519 Non-pressure chronic ulcer of other part of right foot with unspecified severity: Secondary | ICD-10-CM | POA: Diagnosis present

## 2016-03-29 DIAGNOSIS — I2510 Atherosclerotic heart disease of native coronary artery without angina pectoris: Secondary | ICD-10-CM | POA: Diagnosis present

## 2016-03-29 DIAGNOSIS — E11628 Type 2 diabetes mellitus with other skin complications: Secondary | ICD-10-CM | POA: Diagnosis present

## 2016-03-29 DIAGNOSIS — E1122 Type 2 diabetes mellitus with diabetic chronic kidney disease: Secondary | ICD-10-CM | POA: Diagnosis present

## 2016-03-29 DIAGNOSIS — E1165 Type 2 diabetes mellitus with hyperglycemia: Secondary | ICD-10-CM | POA: Diagnosis present

## 2016-03-29 DIAGNOSIS — Z95810 Presence of automatic (implantable) cardiac defibrillator: Secondary | ICD-10-CM

## 2016-03-29 DIAGNOSIS — I132 Hypertensive heart and chronic kidney disease with heart failure and with stage 5 chronic kidney disease, or end stage renal disease: Secondary | ICD-10-CM | POA: Diagnosis present

## 2016-03-29 DIAGNOSIS — E1142 Type 2 diabetes mellitus with diabetic polyneuropathy: Secondary | ICD-10-CM | POA: Diagnosis present

## 2016-03-29 DIAGNOSIS — I5042 Chronic combined systolic (congestive) and diastolic (congestive) heart failure: Secondary | ICD-10-CM | POA: Diagnosis present

## 2016-03-29 DIAGNOSIS — I6523 Occlusion and stenosis of bilateral carotid arteries: Secondary | ICD-10-CM | POA: Diagnosis present

## 2016-03-29 DIAGNOSIS — Z87891 Personal history of nicotine dependence: Secondary | ICD-10-CM

## 2016-03-29 DIAGNOSIS — N2581 Secondary hyperparathyroidism of renal origin: Secondary | ICD-10-CM | POA: Diagnosis present

## 2016-03-29 DIAGNOSIS — Z96651 Presence of right artificial knee joint: Secondary | ICD-10-CM | POA: Diagnosis present

## 2016-03-29 DIAGNOSIS — E11621 Type 2 diabetes mellitus with foot ulcer: Secondary | ICD-10-CM | POA: Diagnosis present

## 2016-03-29 DIAGNOSIS — Z79899 Other long term (current) drug therapy: Secondary | ICD-10-CM

## 2016-03-29 DIAGNOSIS — E876 Hypokalemia: Secondary | ICD-10-CM | POA: Diagnosis not present

## 2016-03-29 DIAGNOSIS — Z992 Dependence on renal dialysis: Secondary | ICD-10-CM

## 2016-03-29 DIAGNOSIS — Z89421 Acquired absence of other right toe(s): Secondary | ICD-10-CM

## 2016-03-29 DIAGNOSIS — L89322 Pressure ulcer of left buttock, stage 2: Secondary | ICD-10-CM | POA: Diagnosis not present

## 2016-03-29 LAB — COMPREHENSIVE METABOLIC PANEL
ALT: 11 U/L — ABNORMAL LOW (ref 17–63)
AST: 20 U/L (ref 15–41)
Albumin: 2.7 g/dL — ABNORMAL LOW (ref 3.5–5.0)
Alkaline Phosphatase: 51 U/L (ref 38–126)
Anion gap: 14 (ref 5–15)
BUN: 32 mg/dL — ABNORMAL HIGH (ref 6–20)
CO2: 22 mmol/L (ref 22–32)
Calcium: 8.6 mg/dL — ABNORMAL LOW (ref 8.9–10.3)
Chloride: 90 mmol/L — ABNORMAL LOW (ref 101–111)
Creatinine, Ser: 8.89 mg/dL — ABNORMAL HIGH (ref 0.61–1.24)
GFR calc Af Amer: 7 mL/min — ABNORMAL LOW (ref >60)
GFR calc non Af Amer: 6 mL/min — ABNORMAL LOW (ref >60)
Glucose, Bld: 333 mg/dL — ABNORMAL HIGH (ref 65–99)
Potassium: 3.6 mmol/L (ref 3.5–5.1)
Sodium: 126 mmol/L — ABNORMAL LOW (ref 135–145)
Total Bilirubin: 0.8 mg/dL (ref 0.3–1.2)
Total Protein: 7.1 g/dL (ref 6.5–8.1)

## 2016-03-29 LAB — CBC WITH DIFFERENTIAL/PLATELET
Basophils Absolute: 0 10*3/uL (ref 0.0–0.1)
Basophils Relative: 0 %
Eosinophils Absolute: 0.3 10*3/uL (ref 0.0–0.7)
Eosinophils Relative: 3 %
HCT: 30.6 % — ABNORMAL LOW (ref 39.0–52.0)
Hemoglobin: 10 g/dL — ABNORMAL LOW (ref 13.0–17.0)
Lymphocytes Relative: 9 %
Lymphs Abs: 1.1 10*3/uL (ref 0.7–4.0)
MCH: 27.3 pg (ref 26.0–34.0)
MCHC: 32.7 g/dL (ref 30.0–36.0)
MCV: 83.6 fL (ref 78.0–100.0)
Monocytes Absolute: 0.9 10*3/uL (ref 0.1–1.0)
Monocytes Relative: 8 %
Neutro Abs: 9.8 10*3/uL — ABNORMAL HIGH (ref 1.7–7.7)
Neutrophils Relative %: 80 %
Platelets: 317 10*3/uL (ref 150–400)
RBC: 3.66 MIL/uL — ABNORMAL LOW (ref 4.22–5.81)
RDW: 13.6 % (ref 11.5–15.5)
WBC: 12.2 10*3/uL — ABNORMAL HIGH (ref 4.0–10.5)

## 2016-03-29 NOTE — ED Triage Notes (Signed)
Pt. reports right foot wound infection with swelling/drainage onset last week , currently taking oral Cipro antibiotic with no improvement , advised by his PCP to go to ER for antibiotic treatment .

## 2016-03-30 ENCOUNTER — Inpatient Hospital Stay (HOSPITAL_COMMUNITY)
Admission: EM | Admit: 2016-03-30 | Discharge: 2016-04-05 | DRG: 252 | Disposition: A | Discharge status: Home Health | Payer: BLUE CROSS/BLUE SHIELD | Attending: Internal Medicine | Admitting: Internal Medicine

## 2016-03-30 ENCOUNTER — Emergency Department (HOSPITAL_COMMUNITY): Payer: BLUE CROSS/BLUE SHIELD

## 2016-03-30 ENCOUNTER — Encounter (HOSPITAL_COMMUNITY)
Admission: EM | Disposition: A | Discharge status: Home Health | Payer: Self-pay | Source: Home / Self Care | Attending: Internal Medicine

## 2016-03-30 ENCOUNTER — Inpatient Hospital Stay (HOSPITAL_COMMUNITY): Payer: BLUE CROSS/BLUE SHIELD

## 2016-03-30 ENCOUNTER — Encounter (HOSPITAL_COMMUNITY): Payer: Self-pay | Admitting: *Deleted

## 2016-03-30 DIAGNOSIS — I739 Peripheral vascular disease, unspecified: Secondary | ICD-10-CM | POA: Diagnosis present

## 2016-03-30 DIAGNOSIS — E1142 Type 2 diabetes mellitus with diabetic polyneuropathy: Secondary | ICD-10-CM | POA: Diagnosis present

## 2016-03-30 DIAGNOSIS — I6523 Occlusion and stenosis of bilateral carotid arteries: Secondary | ICD-10-CM | POA: Diagnosis present

## 2016-03-30 DIAGNOSIS — N186 End stage renal disease: Secondary | ICD-10-CM | POA: Diagnosis present

## 2016-03-30 DIAGNOSIS — I2510 Atherosclerotic heart disease of native coronary artery without angina pectoris: Secondary | ICD-10-CM | POA: Diagnosis present

## 2016-03-30 DIAGNOSIS — E118 Type 2 diabetes mellitus with unspecified complications: Secondary | ICD-10-CM | POA: Diagnosis present

## 2016-03-30 DIAGNOSIS — N189 Chronic kidney disease, unspecified: Secondary | ICD-10-CM

## 2016-03-30 DIAGNOSIS — E1165 Type 2 diabetes mellitus with hyperglycemia: Secondary | ICD-10-CM

## 2016-03-30 DIAGNOSIS — Z992 Dependence on renal dialysis: Secondary | ICD-10-CM

## 2016-03-30 DIAGNOSIS — L89322 Pressure ulcer of left buttock, stage 2: Secondary | ICD-10-CM | POA: Diagnosis not present

## 2016-03-30 DIAGNOSIS — R0689 Other abnormalities of breathing: Secondary | ICD-10-CM

## 2016-03-30 DIAGNOSIS — D631 Anemia in chronic kidney disease: Secondary | ICD-10-CM | POA: Diagnosis present

## 2016-03-30 DIAGNOSIS — L089 Local infection of the skin and subcutaneous tissue, unspecified: Secondary | ICD-10-CM | POA: Diagnosis present

## 2016-03-30 DIAGNOSIS — L97403 Non-pressure chronic ulcer of unspecified heel and midfoot with necrosis of muscle: Secondary | ICD-10-CM | POA: Diagnosis not present

## 2016-03-30 DIAGNOSIS — A498 Other bacterial infections of unspecified site: Secondary | ICD-10-CM | POA: Diagnosis not present

## 2016-03-30 DIAGNOSIS — L89312 Pressure ulcer of right buttock, stage 2: Secondary | ICD-10-CM | POA: Diagnosis not present

## 2016-03-30 DIAGNOSIS — E1129 Type 2 diabetes mellitus with other diabetic kidney complication: Secondary | ICD-10-CM | POA: Diagnosis not present

## 2016-03-30 DIAGNOSIS — D62 Acute posthemorrhagic anemia: Secondary | ICD-10-CM | POA: Diagnosis not present

## 2016-03-30 DIAGNOSIS — E13622 Other specified diabetes mellitus with other skin ulcer: Secondary | ICD-10-CM | POA: Diagnosis not present

## 2016-03-30 DIAGNOSIS — E785 Hyperlipidemia, unspecified: Secondary | ICD-10-CM | POA: Diagnosis present

## 2016-03-30 DIAGNOSIS — E1122 Type 2 diabetes mellitus with diabetic chronic kidney disease: Secondary | ICD-10-CM | POA: Diagnosis present

## 2016-03-30 DIAGNOSIS — J449 Chronic obstructive pulmonary disease, unspecified: Secondary | ICD-10-CM | POA: Diagnosis present

## 2016-03-30 DIAGNOSIS — I132 Hypertensive heart and chronic kidney disease with heart failure and with stage 5 chronic kidney disease, or end stage renal disease: Secondary | ICD-10-CM | POA: Diagnosis present

## 2016-03-30 DIAGNOSIS — E0865 Diabetes mellitus due to underlying condition with hyperglycemia: Secondary | ICD-10-CM | POA: Diagnosis not present

## 2016-03-30 DIAGNOSIS — R0989 Other specified symptoms and signs involving the circulatory and respiratory systems: Secondary | ICD-10-CM | POA: Diagnosis not present

## 2016-03-30 DIAGNOSIS — Z01810 Encounter for preprocedural cardiovascular examination: Secondary | ICD-10-CM

## 2016-03-30 DIAGNOSIS — L97418 Non-pressure chronic ulcer of right heel and midfoot with other specified severity: Secondary | ICD-10-CM | POA: Diagnosis not present

## 2016-03-30 DIAGNOSIS — L97519 Non-pressure chronic ulcer of other part of right foot with unspecified severity: Secondary | ICD-10-CM | POA: Diagnosis present

## 2016-03-30 DIAGNOSIS — E11621 Type 2 diabetes mellitus with foot ulcer: Secondary | ICD-10-CM | POA: Diagnosis present

## 2016-03-30 DIAGNOSIS — E871 Hypo-osmolality and hyponatremia: Secondary | ICD-10-CM

## 2016-03-30 DIAGNOSIS — I70235 Atherosclerosis of native arteries of right leg with ulceration of other part of foot: Secondary | ICD-10-CM

## 2016-03-30 DIAGNOSIS — Z9889 Other specified postprocedural states: Secondary | ICD-10-CM | POA: Diagnosis not present

## 2016-03-30 DIAGNOSIS — L97409 Non-pressure chronic ulcer of unspecified heel and midfoot with unspecified severity: Secondary | ICD-10-CM

## 2016-03-30 DIAGNOSIS — K5900 Constipation, unspecified: Secondary | ICD-10-CM | POA: Diagnosis not present

## 2016-03-30 DIAGNOSIS — J410 Simple chronic bronchitis: Secondary | ICD-10-CM

## 2016-03-30 DIAGNOSIS — E1169 Type 2 diabetes mellitus with other specified complication: Secondary | ICD-10-CM | POA: Diagnosis not present

## 2016-03-30 DIAGNOSIS — Z794 Long term (current) use of insulin: Secondary | ICD-10-CM | POA: Diagnosis not present

## 2016-03-30 DIAGNOSIS — E11628 Type 2 diabetes mellitus with other skin complications: Secondary | ICD-10-CM | POA: Diagnosis present

## 2016-03-30 DIAGNOSIS — E1151 Type 2 diabetes mellitus with diabetic peripheral angiopathy without gangrene: Secondary | ICD-10-CM | POA: Diagnosis present

## 2016-03-30 DIAGNOSIS — E876 Hypokalemia: Secondary | ICD-10-CM | POA: Diagnosis not present

## 2016-03-30 DIAGNOSIS — L97509 Non-pressure chronic ulcer of other part of unspecified foot with unspecified severity: Secondary | ICD-10-CM

## 2016-03-30 DIAGNOSIS — I5022 Chronic systolic (congestive) heart failure: Secondary | ICD-10-CM | POA: Diagnosis present

## 2016-03-30 DIAGNOSIS — E1121 Type 2 diabetes mellitus with diabetic nephropathy: Secondary | ICD-10-CM | POA: Diagnosis not present

## 2016-03-30 DIAGNOSIS — L97412 Non-pressure chronic ulcer of right heel and midfoot with fat layer exposed: Secondary | ICD-10-CM | POA: Diagnosis not present

## 2016-03-30 DIAGNOSIS — I428 Other cardiomyopathies: Secondary | ICD-10-CM

## 2016-03-30 DIAGNOSIS — I10 Essential (primary) hypertension: Secondary | ICD-10-CM | POA: Diagnosis present

## 2016-03-30 DIAGNOSIS — N2581 Secondary hyperparathyroidism of renal origin: Secondary | ICD-10-CM | POA: Diagnosis present

## 2016-03-30 DIAGNOSIS — I5042 Chronic combined systolic (congestive) and diastolic (congestive) heart failure: Secondary | ICD-10-CM | POA: Diagnosis present

## 2016-03-30 DIAGNOSIS — Z96651 Presence of right artificial knee joint: Secondary | ICD-10-CM | POA: Diagnosis present

## 2016-03-30 DIAGNOSIS — E08621 Diabetes mellitus due to underlying condition with foot ulcer: Secondary | ICD-10-CM | POA: Diagnosis not present

## 2016-03-30 DIAGNOSIS — Z419 Encounter for procedure for purposes other than remedying health state, unspecified: Secondary | ICD-10-CM

## 2016-03-30 DIAGNOSIS — L97501 Non-pressure chronic ulcer of other part of unspecified foot limited to breakdown of skin: Secondary | ICD-10-CM | POA: Diagnosis not present

## 2016-03-30 HISTORY — PX: ABDOMINAL AORTOGRAM W/LOWER EXTREMITY: CATH118223

## 2016-03-30 LAB — CBC
HCT: 27.3 % — ABNORMAL LOW (ref 39.0–52.0)
Hemoglobin: 8.7 g/dL — ABNORMAL LOW (ref 13.0–17.0)
MCH: 26.7 pg (ref 26.0–34.0)
MCHC: 31.9 g/dL (ref 30.0–36.0)
MCV: 83.7 fL (ref 78.0–100.0)
Platelets: 282 10*3/uL (ref 150–400)
RBC: 3.26 MIL/uL — ABNORMAL LOW (ref 4.22–5.81)
RDW: 13.7 % (ref 11.5–15.5)
WBC: 8.5 10*3/uL (ref 4.0–10.5)

## 2016-03-30 LAB — GLUCOSE, CAPILLARY
Glucose-Capillary: 295 mg/dL — ABNORMAL HIGH (ref 65–99)
Glucose-Capillary: 292 mg/dL — ABNORMAL HIGH (ref 65–99)
Glucose-Capillary: 190 mg/dL — ABNORMAL HIGH (ref 65–99)
Glucose-Capillary: 123 mg/dL — ABNORMAL HIGH (ref 65–99)
Glucose-Capillary: 236 mg/dL — ABNORMAL HIGH (ref 65–99)

## 2016-03-30 LAB — VAS US CAROTID
LEFT ECA DIAS: -19 cm/s
Left CCA dist dias: -26 cm/s
Left CCA dist sys: -173 cm/s
Left CCA prox dias: 15 cm/s
Left CCA prox sys: 100 cm/s
Left ICA dist dias: -35 cm/s
Left ICA dist sys: -107 cm/s
Left ICA prox dias: -22 cm/s
Left ICA prox sys: -101 cm/s
RIGHT ECA DIAS: -11 cm/s
RIGHT VERTEBRAL DIAS: 17 cm/s
Right CCA prox dias: 9 cm/s
Right CCA prox sys: 75 cm/s
Right cca dist sys: -125 cm/s

## 2016-03-30 LAB — C-REACTIVE PROTEIN: CRP: 16.1 mg/dL — ABNORMAL HIGH (ref <1.0)

## 2016-03-30 LAB — RENAL FUNCTION PANEL
Albumin: 2.3 g/dL — ABNORMAL LOW (ref 3.5–5.0)
Anion gap: 13 (ref 5–15)
BUN: 42 mg/dL — ABNORMAL HIGH (ref 6–20)
CO2: 22 mmol/L (ref 22–32)
Calcium: 8.4 mg/dL — ABNORMAL LOW (ref 8.9–10.3)
Chloride: 95 mmol/L — ABNORMAL LOW (ref 101–111)
Creatinine, Ser: 10.19 mg/dL — ABNORMAL HIGH (ref 0.61–1.24)
GFR calc Af Amer: 6 mL/min — ABNORMAL LOW (ref >60)
GFR calc non Af Amer: 5 mL/min — ABNORMAL LOW (ref >60)
Glucose, Bld: 197 mg/dL — ABNORMAL HIGH (ref 65–99)
Phosphorus: 7 mg/dL — ABNORMAL HIGH (ref 2.5–4.6)
Potassium: 3.5 mmol/L (ref 3.5–5.1)
Sodium: 130 mmol/L — ABNORMAL LOW (ref 135–145)

## 2016-03-30 LAB — SURGICAL PCR SCREEN
MRSA, PCR: POSITIVE — AB
Staphylococcus aureus: POSITIVE — AB

## 2016-03-30 LAB — HEPATITIS B SURFACE ANTIGEN: Hepatitis B Surface Ag: NEGATIVE

## 2016-03-30 LAB — CREATININE, SERUM
Creatinine, Ser: 10.24 mg/dL — ABNORMAL HIGH (ref 0.61–1.24)
GFR calc Af Amer: 6 mL/min — ABNORMAL LOW (ref >60)
GFR calc non Af Amer: 5 mL/min — ABNORMAL LOW (ref >60)

## 2016-03-30 LAB — SEDIMENTATION RATE: Sed Rate: 94 mm/hr — ABNORMAL HIGH (ref 0–16)

## 2016-03-30 SURGERY — ABDOMINAL AORTOGRAM W/LOWER EXTREMITY
Anesthesia: LOCAL

## 2016-03-30 MED ORDER — MUPIROCIN 2 % EX OINT: 1.0000 "application " | TOPICAL_OINTMENT | Freq: Two times a day (BID) | CUTANEOUS | Status: DC

## 2016-03-30 MED ORDER — MUPIROCIN 2 % EX OINT
1.0000 | TOPICAL_OINTMENT | Freq: Two times a day (BID) | CUTANEOUS | Status: DC | PRN
Start: 2016-03-30 — End: 2016-04-05
  Filled 2016-03-30: qty 22

## 2016-03-30 MED ORDER — ONDANSETRON HCL 4 MG PO TABS
4.0000 mg | ORAL_TABLET | Freq: Four times a day (QID) | ORAL | Status: DC | PRN
Start: 2016-03-30 — End: 2016-04-05

## 2016-03-30 MED ORDER — FENTANYL CITRATE (PF) 100 MCG/2ML IJ SOLN
INTRAMUSCULAR | Status: AC
  Filled 2016-03-30: qty 2

## 2016-03-30 MED ORDER — INSULIN ASPART 100 UNIT/ML ~~LOC~~ SOLN
0.0000 [IU] | Freq: Three times a day (TID) | SUBCUTANEOUS | Status: DC
  Administered 2016-03-30: 1 [IU] via SUBCUTANEOUS
  Administered 2016-03-30: 2 [IU] via SUBCUTANEOUS
  Administered 2016-03-31 – 2016-04-01 (×2): 7 [IU] via SUBCUTANEOUS
  Administered 2016-04-02 (×2): 2 [IU] via SUBCUTANEOUS
  Administered 2016-04-02: 1 [IU] via SUBCUTANEOUS
  Administered 2016-04-03 (×2): 3 [IU] via SUBCUTANEOUS
  Administered 2016-04-03: 5 [IU] via SUBCUTANEOUS
  Administered 2016-04-04: 2 [IU] via SUBCUTANEOUS

## 2016-03-30 MED ORDER — LIDOCAINE HCL (PF) 1 % IJ SOLN: 5.0000 mL | INTRAMUSCULAR | Status: DC | PRN

## 2016-03-30 MED ORDER — ENOXAPARIN SODIUM 30 MG/0.3ML ~~LOC~~ SOLN: 30.0000 mg | SUBCUTANEOUS | Status: DC

## 2016-03-30 MED ORDER — LIDOCAINE HCL (PF) 1 % IJ SOLN
INTRAMUSCULAR | Status: DC | PRN
  Administered 2016-03-30: 20 mL via INTRADERMAL

## 2016-03-30 MED ORDER — OLOPATADINE HCL 0.1 % OP SOLN: 1.0000 [drp] | Freq: Two times a day (BID) | OPHTHALMIC | Status: DC

## 2016-03-30 MED ORDER — HYDROCODONE-ACETAMINOPHEN 5-325 MG PO TABS
ORAL_TABLET | ORAL | Status: AC
  Administered 2016-03-30: 1 via ORAL
  Filled 2016-03-30: qty 1

## 2016-03-30 MED ORDER — DARBEPOETIN ALFA 100 MCG/0.5ML IJ SOSY
100.0000 ug | PREFILLED_SYRINGE | INTRAMUSCULAR | Status: DC
  Filled 2016-03-30: qty 0.5

## 2016-03-30 MED ORDER — ALTEPLASE 2 MG IJ SOLR: 2.0000 mg | Freq: Once | INTRAMUSCULAR | Status: DC | PRN

## 2016-03-30 MED ORDER — MIDAZOLAM HCL 2 MG/2ML IJ SOLN
INTRAMUSCULAR | Status: AC
  Filled 2016-03-30: qty 2

## 2016-03-30 MED ORDER — ONDANSETRON HCL 4 MG/2ML IJ SOLN
4.0000 mg | Freq: Four times a day (QID) | INTRAMUSCULAR | Status: DC | PRN
  Administered 2016-04-04: 4 mg via INTRAVENOUS

## 2016-03-30 MED ORDER — DEXTROSE 5 % IV SOLN
2.0000 g | Freq: Once | INTRAVENOUS | Status: AC
  Administered 2016-03-30: 2 g via INTRAVENOUS
  Filled 2016-03-30: qty 2

## 2016-03-30 MED ORDER — ACETAMINOPHEN 325 MG PO TABS
650.0000 mg | ORAL_TABLET | Freq: Four times a day (QID) | ORAL | Status: DC | PRN
  Administered 2016-04-05: 650 mg via ORAL
  Filled 2016-03-30: qty 2

## 2016-03-30 MED ORDER — METRONIDAZOLE 500 MG PO TABS
500.0000 mg | ORAL_TABLET | Freq: Three times a day (TID) | ORAL | Status: DC
  Administered 2016-03-30 – 2016-04-03 (×12): 500 mg via ORAL
  Filled 2016-03-30 (×12): qty 1

## 2016-03-30 MED ORDER — SODIUM CHLORIDE 0.9% FLUSH
3.0000 mL | Freq: Two times a day (BID) | INTRAVENOUS | Status: DC
  Administered 2016-03-30 – 2016-04-04 (×7): 3 mL via INTRAVENOUS

## 2016-03-30 MED ORDER — INSULIN PEN NEEDLE 32G X 4 MM MISC: 1.0000 "application " | Freq: Every day | Status: DC

## 2016-03-30 MED ORDER — INSULIN GLARGINE 100 UNIT/ML ~~LOC~~ SOLN: 40.0000 [IU] | Freq: Every day | SUBCUTANEOUS | Status: DC

## 2016-03-30 MED ORDER — LIDOCAINE-PRILOCAINE 2.5-2.5 % EX CREA: 1.0000 "application " | TOPICAL_CREAM | CUTANEOUS | Status: DC | PRN

## 2016-03-30 MED ORDER — ATORVASTATIN CALCIUM 80 MG PO TABS
80.0000 mg | ORAL_TABLET | Freq: Every day | ORAL | Status: DC
  Administered 2016-03-30 – 2016-04-04 (×6): 80 mg via ORAL
  Filled 2016-03-30 (×7): qty 1

## 2016-03-30 MED ORDER — GLUCOSE BLOOD VI STRP: 1.0000 | ORAL_STRIP | Freq: Once | Status: DC

## 2016-03-30 MED ORDER — SODIUM CHLORIDE 0.9 % IV SOLN: 100.0000 mL | INTRAVENOUS | Status: DC | PRN

## 2016-03-30 MED ORDER — ACETAMINOPHEN 650 MG RE SUPP: 650.0000 mg | Freq: Four times a day (QID) | RECTAL | Status: DC | PRN

## 2016-03-30 MED ORDER — FENOFIBRATE 160 MG PO TABS
160.0000 mg | ORAL_TABLET | Freq: Every day | ORAL | Status: DC
  Administered 2016-03-30 – 2016-04-05 (×6): 160 mg via ORAL
  Filled 2016-03-30 (×6): qty 1

## 2016-03-30 MED ORDER — CHLORHEXIDINE GLUCONATE CLOTH 2 % EX PADS
6.0000 | MEDICATED_PAD | Freq: Every day | CUTANEOUS | Status: AC
  Administered 2016-03-31 – 2016-04-04 (×5): 6 via TOPICAL

## 2016-03-30 MED ORDER — HEPARIN (PORCINE) IN NACL 2-0.9 UNIT/ML-% IJ SOLN
INTRAMUSCULAR | Status: DC | PRN
  Administered 2016-03-30: 1000 mL

## 2016-03-30 MED ORDER — GABAPENTIN 300 MG PO CAPS
300.0000 mg | ORAL_CAPSULE | Freq: Three times a day (TID) | ORAL | Status: DC
  Administered 2016-03-30 – 2016-04-05 (×14): 300 mg via ORAL
  Filled 2016-03-30 (×15): qty 1

## 2016-03-30 MED ORDER — AZELASTINE HCL 0.1 % NA SOLN
2.0000 | Freq: Three times a day (TID) | NASAL | Status: DC | PRN
  Filled 2016-03-30: qty 30

## 2016-03-30 MED ORDER — LIDOCAINE HCL (PF) 1 % IJ SOLN
INTRAMUSCULAR | Status: AC
  Filled 2016-03-30: qty 30

## 2016-03-30 MED ORDER — POLYETHYLENE GLYCOL 3350 17 G PO PACK
17.0000 g | PACK | Freq: Two times a day (BID) | ORAL | Status: DC
  Administered 2016-03-30 – 2016-04-01 (×2): 17 g via ORAL
  Filled 2016-03-30 (×3): qty 1

## 2016-03-30 MED ORDER — CARVEDILOL 3.125 MG PO TABS
3.1250 mg | ORAL_TABLET | Freq: Two times a day (BID) | ORAL | Status: DC
  Administered 2016-03-30 – 2016-04-05 (×9): 3.125 mg via ORAL
  Filled 2016-03-30 (×11): qty 1

## 2016-03-30 MED ORDER — IODIXANOL 320 MG/ML IV SOLN
INTRAVENOUS | Status: DC | PRN
  Administered 2016-03-30: 110 mL via INTRA_ARTERIAL

## 2016-03-30 MED ORDER — ASPIRIN EC 81 MG PO TBEC
81.0000 mg | DELAYED_RELEASE_TABLET | Freq: Every day | ORAL | Status: DC
  Administered 2016-03-30 – 2016-04-05 (×5): 81 mg via ORAL
  Filled 2016-03-30 (×5): qty 1

## 2016-03-30 MED ORDER — SODIUM CHLORIDE 0.9 % IV BOLUS (SEPSIS): 1000.0000 mL | Freq: Once | INTRAVENOUS | Status: DC

## 2016-03-30 MED ORDER — SODIUM CHLORIDE 0.9 % IV BOLUS (SEPSIS)
500.0000 mL | Freq: Once | INTRAVENOUS | Status: AC
  Administered 2016-03-30: 500 mL via INTRAVENOUS

## 2016-03-30 MED ORDER — FENTANYL CITRATE (PF) 100 MCG/2ML IJ SOLN
INTRAMUSCULAR | Status: DC | PRN
  Administered 2016-03-30: 50 ug via INTRAVENOUS

## 2016-03-30 MED ORDER — HEPARIN (PORCINE) IN NACL 2-0.9 UNIT/ML-% IJ SOLN
INTRAMUSCULAR | Status: AC
  Filled 2016-03-30: qty 1000

## 2016-03-30 MED ORDER — INSULIN GLARGINE 100 UNIT/ML ~~LOC~~ SOLN
20.0000 [IU] | Freq: Once | SUBCUTANEOUS | Status: AC
  Administered 2016-03-30: 20 [IU] via SUBCUTANEOUS
  Filled 2016-03-30: qty 0.2

## 2016-03-30 MED ORDER — HEPARIN SODIUM (PORCINE) 1000 UNIT/ML DIALYSIS: 1000.0000 [IU] | INTRAMUSCULAR | Status: DC | PRN

## 2016-03-30 MED ORDER — CHLORHEXIDINE GLUCONATE CLOTH 2 % EX PADS: 6.0000 | MEDICATED_PAD | Freq: Every day | CUTANEOUS | Status: DC

## 2016-03-30 MED ORDER — LORATADINE 10 MG PO TABS
10.0000 mg | ORAL_TABLET | Freq: Every day | ORAL | Status: DC
Start: 2016-03-30 — End: 2016-04-05
  Administered 2016-03-30 – 2016-04-05 (×6): 10 mg via ORAL
  Filled 2016-03-30 (×6): qty 1

## 2016-03-30 MED ORDER — MIDAZOLAM HCL 2 MG/2ML IJ SOLN
INTRAMUSCULAR | Status: DC | PRN
  Administered 2016-03-30: 1 mg via INTRAVENOUS

## 2016-03-30 MED ORDER — DEXTROSE 5 % IV SOLN
1.0000 g | INTRAVENOUS | Status: DC
  Administered 2016-03-30 – 2016-04-02 (×4): 1 g via INTRAVENOUS
  Filled 2016-03-30 (×4): qty 1

## 2016-03-30 MED ORDER — MUPIROCIN 2 % EX OINT
1.0000 "application " | TOPICAL_OINTMENT | Freq: Two times a day (BID) | CUTANEOUS | Status: AC
  Administered 2016-03-30 – 2016-04-03 (×8): 1 via NASAL
  Filled 2016-03-30: qty 22

## 2016-03-30 MED ORDER — INSULIN ASPART 100 UNIT/ML ~~LOC~~ SOLN: 0.0000 [IU] | Freq: Every day | SUBCUTANEOUS | Status: DC

## 2016-03-30 MED ORDER — PENTAFLUOROPROP-TETRAFLUOROETH EX AERO: 1.0000 "application " | INHALATION_SPRAY | CUTANEOUS | Status: DC | PRN

## 2016-03-30 MED ORDER — INSULIN ASPART 100 UNIT/ML ~~LOC~~ SOLN
6.0000 [IU] | Freq: Once | SUBCUTANEOUS | Status: AC
  Administered 2016-03-30: 6 [IU] via SUBCUTANEOUS

## 2016-03-30 MED ORDER — HYDROCODONE-ACETAMINOPHEN 5-325 MG PO TABS
1.0000 | ORAL_TABLET | Freq: Four times a day (QID) | ORAL | Status: DC | PRN
  Administered 2016-03-30 (×2): 1 via ORAL
  Filled 2016-03-30: qty 1

## 2016-03-30 MED ORDER — LANTHANUM CARBONATE 500 MG PO CHEW
1000.0000 mg | CHEWABLE_TABLET | Freq: Three times a day (TID) | ORAL | Status: DC
  Administered 2016-03-30 – 2016-04-05 (×15): 1000 mg via ORAL
  Filled 2016-03-30 (×14): qty 2

## 2016-03-30 MED ORDER — ONDANSETRON HCL 4 MG/2ML IJ SOLN: 4.0000 mg | Freq: Four times a day (QID) | INTRAMUSCULAR | Status: DC | PRN

## 2016-03-30 MED ORDER — DOXERCALCIFEROL 4 MCG/2ML IV SOLN
2.5000 ug | INTRAVENOUS | Status: DC
  Administered 2016-04-04: 2.5 ug via INTRAVENOUS
  Filled 2016-03-30 (×2): qty 2

## 2016-03-30 SURGICAL SUPPLY — 10 items
CATH ANGIO 5F PIGTAIL 65CM (CATHETERS) ×1 IMPLANT
CATH CROSS OVER TEMPO 5F (CATHETERS) ×1 IMPLANT
CATH STRAIGHT 5FR 65CM (CATHETERS) ×1 IMPLANT
KIT PV (KITS) ×2 IMPLANT
SHEATH PINNACLE 5F 10CM (SHEATH) ×1 IMPLANT
SYR MEDRAD MARK V 150ML (SYRINGE) ×2 IMPLANT
TRANSDUCER W/STOPCOCK (MISCELLANEOUS) ×2 IMPLANT
TRAY PV CATH (CUSTOM PROCEDURE TRAY) ×2 IMPLANT
WIRE HITORQ VERSACORE ST 145CM (WIRE) ×1 IMPLANT
WIRE MINI STICK MAX (SHEATH) ×1 IMPLANT

## 2016-03-30 NOTE — Consult Note (Signed)
Patient name: Anthony Bullock MRN: [id number] DOB: [birth year] Sex: male  REASON FOR CONSULT: Diabetic foot infection, right foot. Consult is from the emergency department.  HPI: Anthony Bullock is a 56 y.o. male, who was scheduled to be seen tomorrow in our office for consultation concerning a nonhealing wound of the right foot. However, he presented to the emergency room last night because of drainage from the right foot. He is on Cipro and after the toe amputation did have some fever as high as 102.6.   He had some work on his right fifth toenail and then subsequently developed dry gangrene of the right 5th toe with infection. He underwent amputation of the right fifth toe on 03/10/16 by Dr. Irving Shows.   Prior to all of this, I really do not get any history of claudication or rest pain. His risk factors for peripheral vascular disease include diabetes, hypertension, hypercholesterolemia, and a history of tobacco use.   He does have a history of congestive heart failure and has a defibrillator that was placed by Dr. Jens Som.  I have previously seen the patient in the past for hemodialysis access issues. He has end-stage renal disease and dialyzes on Monday Wednesdays and Fridays in [facility],  currently using a right IJ tunneled dialysis catheter. He had a left brachial cephalic fistula performed on 01/14/2016 which is not yet ready for access. He was scheduled to see me back to evaluate this in another couple weeks.  Past Medical History:  Diagnosis Date  . 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 Allergic rhinitis   . Anemia   . Arthritis   . Chronic systolic heart failure ([place])    a. ECHO (12/2012) EF 25-30%, HK entireanteroseptal myocardium //  b.  EF 25%, diffuse HK, grade 1 diastolic dysfunction, MAC, mild LAE, normal RVSF, trivial pericardial effusion  . COPD (chronic obstructive pulmonary disease) ([place])   . Diabetes mellitus type II   . Diabetic  nephropathy ([place])   . Diabetic neuropathy ([place])   . ESRD on hemodialysis [facility])    started HD June 2017, goes to [facility] HD unit, Dr Hinda Lenis  . History of cardiac catheterization    a.Myoview 1/15:  There is significant left ventricular dysfunction. There may be slight scar at the apex. There is no significant ischemia. LV Ejection Fraction: 27%  //  b. RHC/LHC (1/15) with mean RA 6, PA 47/22 mean 33, mean PCWP 20, PVR 2.5 WU, CI 2.5; 80% dLAD stenosis, 70% diffuse large D.   . History of kidney stones   . Hyperlipidemia   . Hypertension   . Kidney stones   . NICM (nonischemic cardiomyopathy) ([place])    Primarily nonischemic. Echo (12/14) with EF 25-30%. Echo (3/15) with EF 25%, mild to moderately dilated LV, normal RV size and systolic function.   . Pneumonia   . Urethral stricture     Family History  Problem Relation Age of Onset  . Bladder Cancer Mother   . Alcohol abuse Father   . Melanoma Father   . Stroke Maternal Grandmother   . Heart Problems Maternal Grandmother     unknown  . Diabetes Maternal Grandmother   . Heart disease Maternal Grandfather   . Prostate cancer Maternal Grandfather    FHx: He denies any history of premature cardiovascular disease.   SOCIAL HISTORY: He smoked 2 packs per day for many years but quit 7 years ago.  Social History  Social History  . Marital status: Married    Spouse name: N/A  . Number of children: 0  . Years of education: N/A   Occupational History  . Not on file.   Social History Main Topics  . Smoking status: Former Smoker    Packs/day: 2.00    Years: 32.00    Types: Cigarettes    Quit date: 05/11/2010  . Smokeless tobacco: Never Used  . Alcohol use No  . Drug use: No  . Sexual activity: Not on file   Other Topics Concern  . 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 Morphine Sulfate Rash and Other (See Comments)    REACTION: Itches all  over, red spots    Current Facility-Administered Medications  Medication Dose Route Frequency Provider Last Rate Last Dose  . metroNIDAZOLE (FLAGYL) tablet 500 mg  500 mg Oral Q8H Margarita Mail, PA-C       Current Outpatient Prescriptions  Medication Sig Dispense Refill  . aspirin EC 81 MG tablet Take 81 mg by mouth daily.     [name] atorvastatin (LIPITOR) 80 MG tablet TAKE 1 TABLET AT BEDTIME 90 tablet 3  . azelastine (ASTELIN) 0.1 % nasal spray Place 2 sprays into both nostrils 3 (three) times daily as needed for rhinitis. Use in each nostril as directed 30 mL 12  . BD PEN NEEDLE NANO U/F 32G X 4 MM MISC USE ONCE DAILY 90 each 2  . carvedilol (COREG) 12.5 MG tablet Take 1 tablet (12.5 mg total) by mouth 2 (two) times daily with a meal. 60 tablet 0  . fenofibrate 160 MG tablet TAKE 1 TABLET DAILY 90 tablet 3  . fexofenadine (ALLEGRA) 180 MG tablet Take 180 mg by mouth daily.    . furosemide (LASIX) 40 MG tablet Take 120 mg by mouth daily.    [name] gabapentin (NEURONTIN) 300 MG capsule Take 1 capsule (300 mg total) by mouth 3 (three) times daily. 270 capsule 2  . glucose blood test strip Check 1 time daily. E11.9 One Touch Ultra Blue Test Strips 100 each 3  . HYDROcodone-acetaminophen (NORCO/VICODIN) 5-325 MG tablet Take 1 tablet by mouth every 6 (six) hours as needed for moderate pain. 6 tablet 0  . Insulin Glargine (LANTUS SOLOSTAR) 100 UNIT/ML Solostar Pen Inject 40 Units into the skin daily at 10 pm.     . insulin lispro (HUMALOG) 100 UNIT/ML injection Inject 0-14 Units into the skin 3 (three) times daily as needed for high blood sugar (per sliding scale).    [name] lanthanum (FOSRENOL) 1000 MG chewable tablet Chew 1,000 mg by mouth. Take one tablet after each meal.    . mupirocin ointment (BACTROBAN) 2 % Place 1 application into the nose 2 (two) times daily as needed. 22 g 2  . Olopatadine HCl (PATADAY) 0.2 % SOLN Place 1 drop into both eyes daily as needed (for allergies).       REVIEW OF  SYSTEMS:  _0  denotes positive finding, _1  denotes negative finding Cardiac  Comments:  Chest pain or chest pressure:    Shortness of breath upon exertion: X   Short of breath when lying flat:    Irregular heart rhythm:        Vascular    Pain in calf, thigh, or hip brought on by ambulation:    Pain in feet at night that wakes you up from your  sleep:     Blood clot in your veins:    Leg swelling:         Pulmonary    Oxygen at home:    Productive cough:     Wheezing:         Neurologic    Sudden weakness in arms or legs:     Sudden numbness in arms or legs:     Sudden onset of difficulty speaking or slurred speech:    Temporary loss of vision in one eye:     Problems with dizziness:         Gastrointestinal    Blood in stool:     Vomited blood:         Genitourinary    Burning when urinating:     Blood in urine:        Psychiatric    Major depression:         Hematologic    Bleeding problems:    Problems with blood clotting too easily:        Skin    Rashes or ulcers: X       Constitutional    Fever or chills: X     PHYSICAL EXAM: Vitals:   03/29/16 2223 03/30/16 0028 03/30/16 0115 03/30/16 0130  BP: (!) 137/57 (!) 149/48 (!) 126/58 (!) 138/45  Pulse: 86 85 81 80  Resp: 18 18    Temp: 99.5 F (37.5 C)     TempSrc: Oral     SpO2: 98% 98% 100% 100%  Weight:      Height:        GENERAL: The patient is a well-nourished male, in no acute distress. The vital signs are documented above. CARDIAC: There is a regular rate and rhythm.  VASCULAR: He has bilateral carotid bruits. On the right side, which is the side of concern, he has a palpable femoral pulse. I cannot palpate popliteal or pedal pulses. He has a dampened monophasic dorsalis pedis signal on the right with the Doppler and a fairly brisk posterior tibial signal with the Doppler. On the left side, he has a palpable femoral pulse. I cannot palpate popliteal or pedal pulses. He has a monophasic  dorsalis pedis and posterior tibial signal. There is no significant lower extremity swelling. PULMONARY: There is good air exchange bilaterally without wheezing or rales. ABDOMEN: Soft and non-tender with normal pitched bowel sounds.  MUSCULOSKELETAL: He has had a previous right fifth toe amputation.  NEUROLOGIC: No focal weakness or paresthesias are detected. SKIN:  PSYCHIATRIC: The patient has a normal affect.  DATA:   RIGHT LOWER EXTREMITY ARTERIAL DUPLEX:  I have reviewed his right lower extremity arterial duplex that was done on 03/24/2016. This shows a biphasic common femoral artery waveform with monophasic signals below that. No flow is noted in the superficial femoral artery from the origin to the distal thigh.   CAROTID DUPLEX: He had a carotid duplex scan in August 2016 which showed a less than 40% internal carotid artery stenosis bilaterally. He also had some stenosis of less than 50% in the left common carotid artery.  X-RAY RIGHT FOOT: There is some air in the soft tissue adjacent to the right fifth toe amputation. No obvious evidence of osteomyelitis is noted.  MEDICAL ISSUES:  DIABETIC FOOT INFECTION RIGHT FOOT WITH SEVERE INFRAINGUINAL ARTERIAL OCCLUSIVE DISEASE: This patient has a nonhealing wound of their right foot. Given his diabetes and severe infrainguinal arterial occlusive disease, this is clearly a limb  threatening problem. His duplex shows occlusion of the superficial femoral artery. I have recommended that we proceed with an arteriogram to see what his options for revascularization are. I will try to get this done this morning if possible and then he could do his hemodialysis after. I will make further recommendations pending the results of his arteriogram.   I have reviewed with the patient the indications for arteriography. In addition, I have reviewed the potential complications of arteriography including but not limited to: Bleeding, arterial injury, arterial  thrombosis, dye action, renal insufficiency, or other unpredictable medical problems. I have explained to the patient that if we find disease amenable to angioplasty we could potentially address this at the same time. I have discussed the potential complications of angioplasty and stenting, including but not limited to: Bleeding, arterial thrombosis, arterial injury, dissection, or the need for surgical intervention.  BILATERAL CAROTID BRUITS: He has bilateral carotid bruits. His last duplex scan was in 2016. He is asymptomatic. I have ordered a carotid duplex scan.   [name] [facility] [place] [place] [phone]

## 2016-03-30 NOTE — ED Provider Notes (Signed)
[place] DEPT Provider Note   CSN: [id number] Arrival date & time: 03/29/16  1837     History   Chief Complaint Chief Complaint  Patient presents with  . Right Foot Infection    HPI Anthony Bullock is a 56 y.o. male.With a past medical history of vascular disease, diabetes, end-stage renal disease on hemodialysis Monday, Wednesday, Friday, last dialyzed 3/19. Patient had a recent right fifth toe amputation done by Dr. Irving Shows at [facility]. Patient states that he saw Dr. Irving Shows today who sent him into the emergency department for evaluation of foot infection. He has been seen already by Dr. Doren Custard at the vein and vascular surgery specialist to help increase the blood flow to his region of his foot. Patient denies any fevers. He has significant pain in his foot along the right lateral edge, as well as under the ball of his right foot. He also complains of a large painful swelling there. He states that his surgeon was concerned that he may need further intervention.  HPI  Past Medical History:  Diagnosis Date  . 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 Allergic rhinitis   . Anemia   . Arthritis   . Chronic systolic heart failure ([place])    a. ECHO (12/2012) EF 25-30%, HK entireanteroseptal myocardium //  b.  EF 25%, diffuse HK, grade 1 diastolic dysfunction, MAC, mild LAE, normal RVSF, trivial pericardial effusion  . COPD (chronic obstructive pulmonary disease) ([place])   . Diabetes mellitus type II   . Diabetic nephropathy ([place])   . Diabetic neuropathy ([place])   . ESRD on hemodialysis [facility])    started HD June 2017, goes to [facility] HD unit, Dr Hinda Lenis  . History of cardiac catheterization    a.Myoview 1/15:  There is significant left ventricular dysfunction. There may be slight scar at the apex. There is no significant ischemia. LV Ejection Fraction: 27%  //  b. RHC/LHC (1/15) with mean RA 6, PA 47/22 mean 33, mean PCWP 20, PVR 2.5 WU, CI 2.5; 80% dLAD  stenosis, 70% diffuse large D.   . History of kidney stones   . Hyperlipidemia   . Hypertension   . Kidney stones   . NICM (nonischemic cardiomyopathy) ([place])    Primarily nonischemic. Echo (12/14) with EF 25-30%. Echo (3/15) with EF 25%, mild to moderately dilated LV, normal RV size and systolic function.   . Pneumonia   . Urethral stricture     Patient Active Problem List   Diagnosis Date Noted  . Foot infection 03/30/2016  . Amputated toe of right foot ([place]) 03/17/2016  . ESRD (end stage renal disease) on dialysis ([place]) 11/30/2015  . Acute respiratory failure with hypoxia ([place]) 11/29/2015  . CHF exacerbation ([place]) 11/29/2015  . At high risk for falls 09/29/2014  . Peripheral vascular disease ([place]) 09/29/2014  . Osteoarthritis of both knees 01/07/2014  . Osteoarthritis of multiple joints 10/06/2013  . NICM (nonischemic cardiomyopathy) ([place]) 06/27/2013  . CAD (coronary artery disease) 03/13/2013  . Abnormal nuclear stress test 01/27/2013  . Chronic systolic congestive heart failure ([place]) 01/27/2013  . COPD (chronic obstructive pulmonary disease) ([place]) 12/27/2012  . Diabetic peripheral neuropathy ([place]) 02/10/2012  . Anemia 01/18/2010  . Chronic kidney disease (CKD), stage IV (severe) ([place]) 01/18/2010  . PYELITIS/PYELONEPHRITIS DISEASES CLASSIFIED ELSW 09/28/2009  . URINARY CALCULUS 09/28/2009  . Adjustment disorder with depressed mood 08/21/2009  . Poorly controlled type II diabetes mellitus with renal complication ([place]) 12/87/8676  .  Hyperlipidemia 04/14/2008  . Essential hypertension 04/14/2008  . ALLERGIC RHINITIS 04/14/2008    Past Surgical History:  Procedure Laterality Date  . AV FISTULA PLACEMENT Right 09/08/2015   Procedure: INSERTION OF 4-44m x 45cm  ARTERIOVENOUS (AV) GORE-TEX GRAFT RIGHT UPPER  ARM;  Surgeon: CAngelia Mould MD;  [facility]  Service: Vascular;  Laterality: Right;  . AV FISTULA PLACEMENT Left 01/14/2016   Procedure: CREATION OF LEFT  UPPER ARM ARTERIOVENOUS FISTULA;  Surgeon: CAngelia Mould MD;  [facility]  Service: Vascular;  Laterality: Left;  . BASCILIC VEIN TRANSPOSITION Right 08/22/2014   Procedure: RIGHT UPPER ARM BASCILIC VEIN TRANSPOSITION;  Surgeon: CAngelia Mould MD;  [facility]  Service: Vascular;  Laterality: Right;  . CARDIAC CATHETERIZATION    . CARDIAC DEFIBRILLATOR PLACEMENT  06/27/2013   Sub Q       BY DR KCaryl Comes . COLONOSCOPY WITH PROPOFOL N/A 07/22/2015   Procedure: COLONOSCOPY WITH PROPOFOL;  Surgeon: HDoran Stabler MD;  [facility];  Service: Gastroenterology;  Laterality: N/A;  . HERNIA REPAIR    . IMPLANTABLE CARDIOVERTER DEFIBRILLATOR IMPLANT N/A 06/27/2013   Procedure: SUB Q ICD;  Surgeon: SDeboraha Sprang MD;  [facility];  Service: Cardiovascular;  Laterality: N/A;  . IR GENERIC HISTORICAL Right 11/30/2015   IR THROMBECTOMY AV FISTULA W/THROMBOLYSIS/PTA INC/SHUNT/IMG RIGHT 11/30/2015 GAletta Edouard MD [facility]  . IR GENERIC HISTORICAL  11/30/2015   IR [place] ACCESS RIGHT 11/30/2015 GAletta Edouard MD [facility]  . IR GENERIC HISTORICAL Right 12/15/2015   IR THROMBECTOMY AV FISTULA W/THROMBOLYSIS/PTA/STENT INC/SHUNT/IMG RT 12/15/2015 DArne Cleveland MD [facility]  . IR GENERIC HISTORICAL  12/15/2015   IR [place] VASC ACCESS RIGHT 12/15/2015 DArne Cleveland MD [facility]  . IR GENERIC HISTORICAL  12/28/2015   IR FLUORO GUIDE CV LINE RIGHT 12/28/2015 AMarybelle Killings MD [facility]  . IR GENERIC HISTORICAL  12/28/2015   IR [place] VASC ACCESS RIGHT 12/28/2015 AMarybelle Killings MD [facility]  . LEFT A ND RIGHT HEART CATH  01/30/2013   DR BSung Amabile . LEFT AND RIGHT HEART CATHETERIZATION WITH CORONARY ANGIOGRAM N/A 01/30/2013   Procedure: LEFT AND RIGHT HEART CATHETERIZATION WITH CORONARY ANGIOGRAM;  Surgeon: DJolaine Artist MD;  [facility];  Service: Cardiovascular;  Laterality: N/A;  . PERIPHERAL VASCULAR CATHETERIZATION  Right 01/26/2015   Procedure: A/V Fistulagram;  Surgeon: CAngelia Mould MD;  [facility];  Service: Cardiovascular;  Laterality: Right;  . reapea urethral surgery for recurrent obstruction  2011  . TOTAL KNEE ARTHROPLASTY Right 2007       Home Medications    Prior to Admission medications   Medication Sig Start Date End Date Taking? Authorizing Provider  aspirin EC 81 MG tablet Take 81 mg by mouth daily.    Yes Historical Provider, MD  atorvastatin (LIPITOR) 80 MG tablet TAKE 1 TABLET AT BEDTIME 12/28/15  Yes BEulas Post MD  azelastine (ASTELIN) 0.1 % nasal spray Place 2 sprays into both nostrils 3 (three) times daily as needed for rhinitis. Use in each nostril as directed 03/10/14  Yes BEulas Post MD  BD PEN NEEDLE NANO U/F 32G X 4 MM MISC USE ONCE DAILY 04/03/15  Yes BEulas Post MD  carvedilol (COREG) 12.5 MG tablet Take 1 tablet (12.5 mg total) by mouth 2 (two) times daily with a meal. 12/01/15  Yes OPalmyra DO  ciprofloxacin (CIPRO)  500 MG tablet Take 500 mg by mouth 2 (two) times daily.   Yes Historical Provider, MD  fenofibrate 160 MG tablet TAKE 1 TABLET DAILY 12/28/15  Yes [name], MD  fexofenadine (ALLEGRA) 180 MG tablet Take 180 mg by mouth daily.   Yes Historical Provider, MD  furosemide (LASIX) 40 MG tablet Take 120 mg by mouth daily.   Yes Historical Provider, MD  gabapentin (NEURONTIN) 300 MG capsule Take 1 capsule (300 mg total) by mouth 3 (three) times daily. 03/15/16  Yes [name], MD  glucose blood test strip Check 1 time daily. E11.9 One Touch Ultra Blue Test Strips 03/10/14  Yes [name], MD  HYDROcodone-acetaminophen (NORCO/VICODIN) 5-325 MG tablet Take 1 tablet by mouth every 6 (six) hours as needed for moderate pain. 01/14/16  Yes Alvia Grove, PA-C  Insulin Glargine (LANTUS SOLOSTAR) 100 UNIT/ML Solostar Pen Inject 40 Units into the skin daily at 10 pm.    Yes Historical Provider, MD    insulin lispro (HUMALOG) 100 UNIT/ML injection Inject 0-14 Units into the skin 3 (three) times daily as needed for high blood sugar (per sliding scale).   Yes Historical Provider, MD  lanthanum (FOSRENOL) 1000 MG chewable tablet Chew 1,000 mg by mouth 3 (three) times daily with meals. Take one tablet after each meal.    Yes Historical Provider, MD  mupirocin ointment (BACTROBAN) 2 % Place 1 application into the nose 2 (two) times daily as needed. Patient taking differently: Place 1 application into the nose 2 (two) times daily as needed (rash).  12/08/15  Yes [name], MD  Olopatadine HCl (PATADAY) 0.2 % SOLN Place 1 drop into both eyes daily as needed (for allergies).    Yes Historical Provider, MD    Family History Family History  Problem Relation Age of Onset  . Bladder Cancer Mother   . Alcohol abuse Father   . Melanoma Father   . Stroke Maternal Grandmother   . Heart Problems Maternal Grandmother     unknown  . Diabetes Maternal Grandmother   . Heart disease Maternal Grandfather   . Prostate cancer Maternal Grandfather     Social History Social History  Substance Use Topics  . Smoking status: Former Smoker    Packs/day: 2.00    Years: 32.00    Types: Cigarettes    Quit date: 05/11/2010  . Smokeless tobacco: Never Used  . Alcohol use No     Allergies   Morphine sulfate   Review of Systems Review of Systems  Ten systems reviewed and are negative for acute change, except as noted in the HPI.    Physical Exam Updated Vital Signs BP (!) 114/59 (BP Location: Right Wrist)   Pulse 91   Temp 98.7 F (37.1 C) (Oral)   Resp 18   Ht 5' 10"  (1.778 m)   Wt 85.2 kg   SpO2 98%   BMI 26.95 kg/m   Physical Exam  Constitutional: He is oriented to person, place, and time. He appears well-developed and well-nourished. No distress.  HENT:  Head: Normocephalic and atraumatic.  Eyes: Conjunctivae are normal. No scleral icterus.  Neck: Normal range of motion. Neck  supple.  Cardiovascular: Normal rate, regular rhythm and normal heart sounds.   Pulmonary/Chest: Effort normal and breath sounds normal. No respiratory distress.  Abdominal: Soft. There is no tenderness.  Musculoskeletal: He exhibits no edema.  Right foot with status post right fifth toe amputation. There is a very of ulceration  and purulent discharge from that region. There is also an ulceration on the right lateral foot with black eschar draining purulent discharge. On the plantar surface. There is a5-6 cm erythematous, bulging and tender region. There appears to be some paler areas concerning for purulence under the skin.  Neurological: He is alert and oriented to person, place, and time.  Skin: Skin is warm and dry. He is not diaphoretic.  Psychiatric: His behavior is normal.  Nursing note and vitals reviewed.          ED Treatments / Results  Labs (all labs ordered are listed, but only abnormal results are displayed) Labs Reviewed  CBC WITH DIFFERENTIAL/PLATELET - Abnormal; Notable for the following:       Result Value   WBC 12.2 (*)    RBC 3.66 (*)    Hemoglobin 10.0 (*)    HCT 30.6 (*)    Neutro Abs 9.8 (*)    All other components within normal limits  COMPREHENSIVE METABOLIC PANEL - Abnormal; Notable for the following:    Sodium 126 (*)    Chloride 90 (*)    Glucose, Bld 333 (*)    BUN 32 (*)    Creatinine, Ser 8.89 (*)    Calcium 8.6 (*)    Albumin 2.7 (*)    ALT 11 (*)    GFR calc non Af Amer 6 (*)    GFR calc Af Amer 7 (*)    All other components within normal limits  SEDIMENTATION RATE - Abnormal; Notable for the following:    Sed Rate 94 (*)    All other components within normal limits  C-REACTIVE PROTEIN - Abnormal; Notable for the following:    CRP 16.1 (*)    All other components within normal limits  CULTURE, BLOOD (ROUTINE X 2)  CULTURE, BLOOD (ROUTINE X 2)  AEROBIC CULTURE (SUPERFICIAL SPECIMEN)    EKG  EKG Interpretation None        Radiology Dg Foot Complete Right  Result Date: 03/30/2016 CLINICAL DATA:  Status post amputation of right fifth toe. Right foot infection. Initial encounter. EXAM: RIGHT FOOT COMPLETE - 3+ VIEW COMPARISON:  Right foot radiographs performed 02/20/2016, and right foot CT performed 02/21/2016 FINDINGS: The patient is status post amputation of the fifth proximal phalanx. Overlying soft tissue swelling is noted. Mild soft tissue air is seen at the site of amputation. Would correlate for any evidence of infection with a gas producing organism. Diffuse vascular calcifications are seen. A small plantar calcaneal spur is seen. IMPRESSION: 1. Status post amputation at the fifth proximal phalanx. Soft tissue swelling noted, with mild soft tissue air. Would correlate for any evidence of infection with a gas producing organism. 2. Diffuse vascular calcifications seen. Electronically Signed   By: Garald Balding M.D.   On: 03/30/2016 02:44    Procedures Procedures (including critical care time)  Medications Ordered in ED Medications  metroNIDAZOLE (FLAGYL) tablet 500 mg (500 mg Oral Given 03/30/16 0438)  ceFEPIme (MAXIPIME) 1 g in dextrose 5 % 50 mL IVPB (not administered)  sodium chloride 0.9 % bolus 500 mL (0 mLs Intravenous Stopped 03/30/16 0525)  ceFEPIme (MAXIPIME) 2 g in dextrose 5 % 50 mL IVPB (0 g Intravenous Stopped 03/30/16 0509)     Initial Impression / Assessment and Plan / ED Course  I have reviewed the triage vital signs and the nursing notes.  Pertinent labs & imaging results that were available during my care of the patient were reviewed by  me and considered in my medical decision making (see chart for details).  Clinical Course as of Mar 30 629  Wed Mar 30, 2016  0305 DG Foot Complete Right [AH]  [phone] The patient with subcutaneous gas on exam. I did send a superficial wound culture on the patient's foot. I spoken with Dr. Scot Dock , who will consult on the patient. I placed the patient  on cefepime and Flagyl. He'll be admitted by the hospitalist service.  [AH]    Clinical Course User Index [AH] Margarita Mail, PA-C      Final Clinical Impressions(s) / ED Diagnoses   Final diagnoses:  Diabetic foot infection [facility])  Hyponatremia    New Prescriptions Current Discharge Medication List       Margarita Mail, PA-C 03/30/16 [place], MD 03/30/16 [phone]

## 2016-03-30 NOTE — Progress Notes (Signed)
Patient arrived to unit by bed.  Reviewed treatment plan and this RN agrees with plan.  Report received from bedside RN, Urban Gibson.  Consent obtained.  Patient A & O X 4.   Lung sounds clear to ausculation in all fields. Generalized RLE edema. Cardiac:  NSR.  Removed caps and cleansed RIJ catheter with chlorhedxidine.  Aspirated ports of heparin and flushed them with saline per protocol.  Connected and secured lines, initiated treatment at 1315.  UF Goal of 2500 mL and net fluid removal 2 L.  Will continue to monitor.

## 2016-03-30 NOTE — Op Note (Signed)
   PATIENT: Anthony Bullock   MRN: [id number] DOB: Jan 19, 1960    DATE OF PROCEDURE: 03/30/2016  INDICATIONS: Anthony Bullock is a 56 y.o. male who presents with a nonhealing wound in the right foot. He had undergone right fifth toe amputation but this has failed to heal. He presented to the emergency department and has evidence of infrainguinal arterial occlusive disease.  PROCEDURE:  1. Ultrasound-guided access to the left common femoral artery 2. Aortogram with bilateral iliac arteriogram  3. Selective catheterization of the right external iliac artery with right lower extremity runoff 4. Retrograde left femoral arteriogram  SURGEON: Judeth Cornfield. Scot Dock, MD, FACS  ANESTHESIA: Local with sedation   EBL: Minimal  TECHNIQUE: The patient was taken to the peripheral vascular lab and was sedated. The period of conscious sedation was 40 minutes.  During that time period, I was present face-to-face 100% of the time.  The patient was administered 1 mg of Versed and 50 g of fentanyl. The patient's heart rate, blood pressure, and oxygen saturation were monitored by the nurse continuously during the procedure.  Both groins were prepped and draped in the usual sterile fashion. Under ultrasound guidance, after the skin was anesthetized, the left common femoral artery was cannulated with a micropuncture needle and micropuncture sheath introduced over the wire. This was exchanged for a 5 [place] sheath over a [employer] wire. A pigtail catheter was positioned at the L1 vertebral body and flush aortogram obtained. The catheter was positioned above the aortic bifurcation and an oblique iliac projection was obtained. The pigtail catheter was exchanged for a crossover catheter which was positioned into the right common iliac artery. The wire was advanced down into the external iliac artery and the crossover catheter was exchanged for a straight catheter. Selective right external iliac arteriogram was obtained with right  lower extremity runoff.  Next the catheter was removed and a retrograde left femoral arteriogram was obtained with left lower extremity runoff. At the completion of the procedure, the sheath was removed and pressure held for hemostasis. No immediate complications were noted.   FINDINGS:  1. There are single renal arteries bilaterally with no significant renal artery stenosis identified. The infrarenal aorta, bilateral common iliac arteries, bilateral hypogastric arteries, and bilateral external iliac arteries are patent. 2. On the right side, which is the side of concern, the common femoral and deep femoral artery are patent. The superficial femoral arteries occluded at [place] with reconstitution of the above-knee popliteal artery on the right. There is three-vessel runoff on the right via the anterior tibial, posterior tibial, and peroneal arteries which all have mild diffuse disease. 3. On the left side, the common femoral and deep femoral artery patent. There is moderate to severe diffuse disease of the left superficial femoral artery. The popliteal artery and tibial vessels are patent on the left although he does have some mild tibial disease bilaterally involving the anterior tibial, posterior tibial, and peroneal arteries.  CLINICAL NOTE: The patient will be scheduled for a right femoral to above-knee popliteal artery bypass tomorrow.  I have ordered a vein map.     [name], MD, FACS [facility]  DATE OF DICTATION:   03/30/2016

## 2016-03-30 NOTE — Anesthesia Preprocedure Evaluation (Addendum)
Anesthesia Evaluation  Patient identified by MRN, date of birth, ID band Patient awake    Reviewed: Allergy & Precautions, NPO status , Patient's Chart, lab work & pertinent test results, reviewed documented beta blocker date and time   History of Anesthesia Complications Negative for: history of anesthetic complications  Airway Mallampati: II  TM Distance: >3 FB Neck ROM: Full    Dental  (+) Teeth Intact, Dental Advisory Given   Pulmonary pneumonia, COPD, former smoker,    Pulmonary exam normal breath sounds clear to auscultation- rhonchi       Cardiovascular hypertension, Pt. on medications and Pt. on home beta blockers + CAD, + Peripheral Vascular Disease and +CHF  Normal cardiovascular exam+ Cardiac Defibrillator  Rhythm:Regular Rate:Normal  Echo 8/17: Study Conclusions  - Left ventricle: The cavity size was normal. Systolic function was normal. The estimated ejection fraction was in the range of 50% to 55%. Wall motion was normal; there were no regional wall motion abnormalities. Doppler parameters are consistent with abnormal left ventricular relaxation (grade 1 diastolic dysfunction). - Left atrium: The atrium was mildly dilated. - Atrial septum: No defect or patent foramen ovale was identified.   Neuro/Psych PSYCHIATRIC DISORDERS  Neuromuscular disease    GI/Hepatic negative GI ROS, Neg liver ROS,   Endo/Other  diabetes, Type 2, Insulin Dependent  Renal/GU Dialysis and ESRFRenal disease     Musculoskeletal  (+) Arthritis ,   Abdominal   Peds  Hematology  (+) Blood dyscrasia, anemia ,   Anesthesia Other Findings Day of surgery medications reviewed with the patient.  Reproductive/Obstetrics                           Anesthesia Physical  Anesthesia Plan  ASA: III  Anesthesia Plan: General   Post-op Pain Management:    Induction: Intravenous  Airway Management Planned: Oral  ETT  Additional Equipment:   Intra-op Plan:   Post-operative Plan: Extubation in OR  Informed Consent: I have reviewed the patients History and Physical, chart, labs and discussed the procedure including the risks, benefits and alternatives for the proposed anesthesia with the patient or authorized representative who has indicated his/her understanding and acceptance.   Dental advisory given  Plan Discussed with: CRNA, Anesthesiologist and Surgeon  Anesthesia Plan Comments:        Anesthesia Quick Evaluation

## 2016-03-30 NOTE — H&P (Signed)
History and Physical    Anthony Bullock:[id number] DOB: [birth year] DOA: 03/30/2016   PCP: [name], MD   Patient coming from/Resides with: Private residence  Admission status: Inpatient/telemetry -medically necessary to stay a minimum 2 midnights to rule out impending and/or unexpected changes in physiologic status that may differ from initial evaluation performed in the ER and/or at time of admission. Patient presents with infected diabetic wound right foot with known severe peripheral vascular disease. He's been evaluated by vascular surgery with plans to pursue lower extremity arteriogram given known SFA stenosis on right. In addition, he will require IV antibiotics, frequent neurovascular checks specifically to right lower extremity, telemetry monitoring and frequent nursing care. Given results of arteriogram patient may benefit from some type of revascularization this admission or if peripheral vascular disease not amenable to revascularization techniques he may require amputation.  Chief Complaint: Draining wound with infection right foot  HPI: Anthony Bullock is a 56 y.o. male with medical history significant for peripheral vascular disease with associated diabetic peripheral neuropathy, diabetes, nonischemic cardiac myopathy with chronic systolic heart failure, hypertension, chronic kidney disease on dialysis (MWF), dyslipidemia, COPD, CAD anemia. Patient presented to the ER with complaints of wound infection with increased swelling, drainage that began about one week after his toe amputation prior was started on oral Cipro without improvement. He reported fevers 1 week. Also reports blister just below the area of the fifth toe site and the instep of the foot. Increased tenderness and pain and has been utilizing pain pills. Was informed by PCP to present to ER yesterday evening. Of note patient underwent amputation of right fifth toe on 3/1. Plain films of the right foot revealed mild  soft tissue air/gas at site of amputation Patient has been evaluated by vascular surgery in the emergency department and plans are to pursue lower extremity arteriogram. Patient was also found to have bilateral carotid bruits of carotid duplex ordered as well. Of note initial encounter with patient regarding admission delayed due to patient being taken to the Cath Lab for arteriogram.  ED Course:  Vital Signs: BP (!) 114/59 (BP Location: Right Wrist)   Pulse 91   Temp 98.7 F (37.1 C) (Oral)   Resp 18   Ht _0  (1.778 m)   Wt 85.2 kg (187 lb 13.3 oz)   SpO2 98%   BMI 26.95 kg/m  DG right foot: Soft tissue swelling and mild soft tissue air noted at amputation site fifth toe on right foot. Lab data: Sodium 126, potassium 3.6, chloride 90, glucose 333, CO2 22, anion gap 14, BUN 32, creatinine 8.89, CRP 16.1, white count 12,200 neutrophils 80% and absolute neutrophils 9.8%, hemoglobin 10, MCV 83.6, platelets 317,000, ESR 94, blood cultures obtained in the ER, wound culture obtained in the ER Medications and treatments: Normal saline bolus 1.5 L, Flagyl 500 mg by mouth 1, Maxipime 2 g IV times  Review of Systems:  In addition to the HPI above,  No myalgias or other constitutional symptoms No Headache, changes with Vision or hearing, new weakness, tingling, numbness in any extremity, dizziness, dysarthria or word finding difficulty, gait disturbance or imbalance, tremors or seizure activity No problems swallowing food or Liquids, indigestion/reflux, choking or coughing while eating, abdominal pain with or after eating No Chest pain, Cough or Shortness of Breath, palpitations, orthopnea or DOE No Abdominal pain, N/V, melena,hematochezia, dark tarry stools, constipation No dysuria, malodorous urine, hematuria or flank pain No new skin rashes, lesions, masses or bruises, No  new joint pains, aches, swelling or redness No recent unintentional weight gain or loss No polyuria, polydypsia or  polyphagia   Past Medical History:  Diagnosis Date  . 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 Allergic rhinitis   . Anemia   . Arthritis   . Chronic systolic heart failure ([place])    a. ECHO (12/2012) EF 25-30%, HK entireanteroseptal myocardium //  b.  EF 25%, diffuse HK, grade 1 diastolic dysfunction, MAC, mild LAE, normal RVSF, trivial pericardial effusion  . COPD (chronic obstructive pulmonary disease) ([place])   . Diabetes mellitus type II   . Diabetic nephropathy ([place])   . Diabetic neuropathy ([place])   . ESRD on hemodialysis [facility])    started HD June 2017, goes to [facility] HD unit, Dr Hinda Lenis  . History of cardiac catheterization    a.Myoview 1/15:  There is significant left ventricular dysfunction. There may be slight scar at the apex. There is no significant ischemia. LV Ejection Fraction: 27%  //  b. RHC/LHC (1/15) with mean RA 6, PA 47/22 mean 33, mean PCWP 20, PVR 2.5 WU, CI 2.5; 80% dLAD stenosis, 70% diffuse large D.   . History of kidney stones   . Hyperlipidemia   . Hypertension   . Kidney stones   . NICM (nonischemic cardiomyopathy) ([place])    Primarily nonischemic. Echo (12/14) with EF 25-30%. Echo (3/15) with EF 25%, mild to moderately dilated LV, normal RV size and systolic function.   . Pneumonia   . Urethral stricture     Past Surgical History:  Procedure Laterality Date  . AV FISTULA PLACEMENT Right 09/08/2015   Procedure: INSERTION OF 4-35m x 45cm  ARTERIOVENOUS (AV) GORE-TEX GRAFT RIGHT UPPER  ARM;  Surgeon: CAngelia Mould MD;  [facility]  Service: Vascular;  Laterality: Right;  . AV FISTULA PLACEMENT Left 01/14/2016   Procedure: CREATION OF LEFT UPPER ARM ARTERIOVENOUS FISTULA;  Surgeon: CAngelia Mould MD;  [facility]  Service: Vascular;  Laterality: Left;  . BASCILIC VEIN TRANSPOSITION Right 08/22/2014   Procedure: RIGHT UPPER ARM BASCILIC VEIN TRANSPOSITION;  Surgeon: CAngelia Mould MD;   [facility]  Service: Vascular;  Laterality: Right;  . CARDIAC CATHETERIZATION    . CARDIAC DEFIBRILLATOR PLACEMENT  06/27/2013   Sub Q       BY DR KCaryl Comes . COLONOSCOPY WITH PROPOFOL N/A 07/22/2015   Procedure: COLONOSCOPY WITH PROPOFOL;  Surgeon: HDoran Stabler MD;  [facility];  Service: Gastroenterology;  Laterality: N/A;  . HERNIA REPAIR    . IMPLANTABLE CARDIOVERTER DEFIBRILLATOR IMPLANT N/A 06/27/2013   Procedure: SUB Q ICD;  Surgeon: SDeboraha Sprang MD;  [facility];  Service: Cardiovascular;  Laterality: N/A;  . IR GENERIC HISTORICAL Right 11/30/2015   IR THROMBECTOMY AV FISTULA W/THROMBOLYSIS/PTA INC/SHUNT/IMG RIGHT 11/30/2015 GAletta Edouard MD [facility]  . IR GENERIC HISTORICAL  11/30/2015   IR [place] ACCESS RIGHT 11/30/2015 GAletta Edouard MD [facility]  . IR GENERIC HISTORICAL Right 12/15/2015   IR THROMBECTOMY AV FISTULA W/THROMBOLYSIS/PTA/STENT INC/SHUNT/IMG RT 12/15/2015 DArne Cleveland MD [facility]  . IR GENERIC HISTORICAL  12/15/2015   IR [place] VASC ACCESS RIGHT 12/15/2015 DArne Cleveland MD [facility]  . IR GENERIC HISTORICAL  12/28/2015   IR FLUORO GUIDE CV LINE RIGHT 12/28/2015 AMarybelle Killings MD [facility]  . IR GENERIC HISTORICAL  12/28/2015   IR [place] VASC ACCESS RIGHT 12/28/2015 AMarybelle Killings  MD [facility]  . LEFT A ND RIGHT HEART CATH  01/30/2013   DR Sung Amabile  . LEFT AND RIGHT HEART CATHETERIZATION WITH CORONARY ANGIOGRAM N/A 01/30/2013   Procedure: LEFT AND RIGHT HEART CATHETERIZATION WITH CORONARY ANGIOGRAM;  Surgeon: Jolaine Artist, MD;  [facility];  Service: Cardiovascular;  Laterality: N/A;  . PERIPHERAL VASCULAR CATHETERIZATION Right 01/26/2015   Procedure: A/V Fistulagram;  Surgeon: Angelia Mould, MD;  Location: [place] CV LAB;  Service: Cardiovascular;  Laterality: Right;  . reapea urethral surgery for recurrent obstruction  2011  . TOTAL KNEE ARTHROPLASTY Right 2007     Social History   Social History  . Marital status: Married    Spouse name: N/A  . Number of children: 0  . Years of education: N/A   Occupational History  . Not on file.   Social History Main Topics  . Smoking status: Former Smoker    Packs/day: 2.00    Years: 32.00    Types: Cigarettes    Quit date: 05/11/2010  . Smokeless tobacco: Never Used  . Alcohol use No  . Drug use: No  . Sexual activity: Yes   Other Topics Concern  . Not on file   Social History Narrative   Works at [employer] as a Associate Professor: Special shoe to right foot Work history: Not obtained   Allergies  Allergen Reactions  . Morphine Sulfate Rash and Other (See Comments)    REACTION: Itches all over, red spots    Family History  Problem Relation Age of Onset  . Bladder Cancer Mother   . Alcohol abuse Father   . Melanoma Father   . Stroke Maternal Grandmother   . Heart Problems Maternal Grandmother     unknown  . Diabetes Maternal Grandmother   . Heart disease Maternal Grandfather   . Prostate cancer Maternal Grandfather      Prior to Admission medications   Medication Sig Start Date End Date Taking? Authorizing Provider  aspirin EC 81 MG tablet Take 81 mg by mouth daily.    Yes Historical Provider, MD  atorvastatin (LIPITOR) 80 MG tablet TAKE 1 TABLET AT BEDTIME 12/28/15  Yes [name], MD  azelastine (ASTELIN) 0.1 % nasal spray Place 2 sprays into both nostrils 3 (three) times daily as needed for rhinitis. Use in each nostril as directed 03/10/14  Yes [name], MD  BD PEN NEEDLE NANO U/F 32G X 4 MM MISC USE ONCE DAILY 04/03/15  Yes [name], MD  carvedilol (COREG) 12.5 MG tablet Take 1 tablet (12.5 mg total) by mouth 2 (two) times daily with a meal. 12/01/15  Yes Goldonna, DO  ciprofloxacin (CIPRO) 500 MG tablet Take 500 mg by mouth 2 (two) times daily.   Yes Historical Provider, MD  fenofibrate 160 MG tablet TAKE 1 TABLET DAILY 12/28/15  Yes  [name], MD  fexofenadine (ALLEGRA) 180 MG tablet Take 180 mg by mouth daily.   Yes Historical Provider, MD  furosemide (LASIX) 40 MG tablet Take 120 mg by mouth daily.   Yes Historical Provider, MD  gabapentin (NEURONTIN) 300 MG capsule Take 1 capsule (300 mg total) by mouth 3 (three) times daily. 03/15/16  Yes [name], MD  glucose blood test strip Check 1 time daily. E11.9 One Touch Ultra Blue Test Strips 03/10/14  Yes [name], MD  HYDROcodone-acetaminophen (NORCO/VICODIN) 5-325 MG tablet Take 1 tablet by mouth every 6 (  six) hours as needed for moderate pain. 01/14/16  Yes Alvia Grove, PA-C  Insulin Glargine (LANTUS SOLOSTAR) 100 UNIT/ML Solostar Pen Inject 40 Units into the skin daily at 10 pm.    Yes Historical Provider, MD  insulin lispro (HUMALOG) 100 UNIT/ML injection Inject 0-14 Units into the skin 3 (three) times daily as needed for high blood sugar (per sliding scale).   Yes Historical Provider, MD  lanthanum (FOSRENOL) 1000 MG chewable tablet Chew 1,000 mg by mouth 3 (three) times daily with meals. Take one tablet after each meal.    Yes Historical Provider, MD  mupirocin ointment (BACTROBAN) 2 % Place 1 application into the nose 2 (two) times daily as needed. Patient taking differently: Place 1 application into the nose 2 (two) times daily as needed (rash).  12/08/15  Yes [name], MD  Olopatadine HCl (PATADAY) 0.2 % SOLN Place 1 drop into both eyes daily as needed (for allergies).    Yes Historical Provider, MD    Physical Exam: Vitals:   03/30/16 2585 03/30/16 0500 03/30/16 0515 03/30/16 0550  BP: (!) 149/57 (!) 142/54 (!) 143/55 (!) 114/59  Pulse: 79 77 78 91  Resp:    18  Temp:    98.7 F (37.1 C)  TempSrc:    Oral  SpO2: 97% 96% 96% 98%  Weight:    85.2 kg (187 lb 13.3 oz)  Height:    _0  (1.778 m)      Constitutional: NAD, calm, comfortable Eyes: PERRL, lids and conjunctivae normal ENMT: Mucous membranes are moist. Posterior  pharynx clear of any exudate or lesions.Normal dentition.  Neck: normal, supple, no masses, no thyromegaly Respiratory: clear to auscultation bilaterally, no wheezing, no crackles. Normal respiratory effort. No accessory muscle use.  Cardiovascular: Regular rate and rhythm, no murmurs / rubs / gallops. No extremity edema. Unable to palpate pedal pulses. Left foot is warm to touch right foot is "allergic to touch. Bilateral carotid bruits.  Abdomen: no tenderness, no masses palpated. No hepatosplenomegaly. Bowel sounds positive.  Musculoskeletal: no clubbing / cyanosis. No joint deformity upper and lower extremities. Good ROM, no contractures. Normal muscle tone.  Skin: no rashes, lesions, ulcers. No induration-Eschar over area of cystoscopy and dictation sign on right foot with associated edema and serous drainage-Lo this area is another ulcer on the lateral aspect of the foot with an area of excoriation that appears as if a blister has ruptured underneath. Just medial to this same ulcer is an intact blister with yellow-appearing fluid underneath. Neurologic: CN 2-12 grossly intact. Sensation intact, DTR normal. Strength 5/5 x all 4 extremities.  Psychiatric: Normal judgment and insight. Alert and oriented x 3. Normal mood.    Labs on Admission: I have personally reviewed following labs and imaging studies  CBC:  Recent Labs Lab 03/29/16 1928  WBC 12.2*  NEUTROABS 9.8*  HGB 10.0*  HCT 30.6*  MCV 83.6  PLT 277   Basic Metabolic Panel:  Recent Labs Lab 03/29/16 1928  NA 126*  K 3.6  CL 90*  CO2 22  GLUCOSE 333*  BUN 32*  CREATININE 8.89*  CALCIUM 8.6*   GFR: Estimated Creatinine Clearance: 9.8 mL/min (A) (by C-G formula based on SCr of 8.89 mg/dL (H)). Liver Function Tests:  Recent Labs Lab 03/29/16 1928  AST 20  ALT 11*  ALKPHOS 51  BILITOT 0.8  PROT 7.1  ALBUMIN 2.7*   No results for input(s): LIPASE, AMYLASE in the last 168 hours. No results for  input(s):  AMMONIA in the last 168 hours. Coagulation Profile: No results for input(s): INR, PROTIME in the last 168 hours. Cardiac Enzymes: No results for input(s): CKTOTAL, CKMB, CKMBINDEX, TROPONINI in the last 168 hours. BNP (last 3 results) No results for input(s): PROBNP in the last 8760 hours. HbA1C: No results for input(s): HGBA1C in the last 72 hours. CBG:  Recent Labs Lab 03/30/16 0755 03/30/16 0907  GLUCAP 295* 292*   Lipid Profile: No results for input(s): CHOL, HDL, LDLCALC, TRIG, CHOLHDL, LDLDIRECT in the last 72 hours. Thyroid Function Tests: No results for input(s): TSH, T4TOTAL, FREET4, T3FREE, THYROIDAB in the last 72 hours. Anemia Panel: No results for input(s): VITAMINB12, FOLATE, FERRITIN, TIBC, IRON, RETICCTPCT in the last 72 hours. Urine analysis:    Component Value Date/Time   COLORURINE yellow 09/28/2009 1317   APPEARANCEUR Clear 09/28/2009 1317   LABSPEC 1.010 09/28/2009 1317   PHURINE 5.0 09/28/2009 1317   HGBUR 2+ 09/28/2009 1317   BILIRUBINUR negative 03/17/2016 1648   PROTEINUR trace 03/17/2016 1648   UROBILINOGEN 0.2 03/17/2016 1648   UROBILINOGEN 0.2 09/28/2009 1317   NITRITE negative 03/17/2016 1648   NITRITE negative 09/28/2009 1317   LEUKOCYTESUR small (1+) (A) 03/17/2016 1648   Sepsis Labs: _0 (procalcitonin:4,lacticidven:4) ) Recent Results (from the past 240 hour(s))  Wound or Superficial Culture     Status: None (Preliminary result)   Collection Time: 03/30/16  3:52 AM  Result Value Ref Range Status   Specimen Description WOUND  Final   Special Requests Immunocompromised RIGHT FOOT  Final   Gram Stain NO WBC SEEN NO ORGANISMS SEEN   Final   Culture PENDING  Incomplete   Report Status PENDING  Incomplete     Radiological Exams on Admission: Dg Foot Complete Right  Result Date: 03/30/2016 CLINICAL DATA:  Status post amputation of right fifth toe. Right foot infection. Initial encounter. EXAM: RIGHT FOOT COMPLETE - 3+ VIEW  COMPARISON:  Right foot radiographs performed 02/20/2016, and right foot CT performed 02/21/2016 FINDINGS: The patient is status post amputation of the fifth proximal phalanx. Overlying soft tissue swelling is noted. Mild soft tissue air is seen at the site of amputation. Would correlate for any evidence of infection with a gas producing organism. Diffuse vascular calcifications are seen. A small plantar calcaneal spur is seen. IMPRESSION: 1. Status post amputation at the fifth proximal phalanx. Soft tissue swelling noted, with mild soft tissue air. Would correlate for any evidence of infection with a gas producing organism. 2. Diffuse vascular calcifications seen. Electronically Signed   By: Garald Balding M.D.   On: 03/30/2016 02:44     Assessment/Plan Principal Problem:   Diabetic foot ulcer/Diabetic peripheral neuropathy  -Patient presents with infected-appearing foot wound and recent 5th digit amputation with x-ray findings concerning for gas/infection-described by vascular surgeon as nonhealing wound -Evaluated by vascular surgery regarding known severe vascular disease (see below) -Please see pictures from ED PA-C note for specific wound descriptors -Empiric Flagyl and Maxipime  Active Problems:   Peripheral vascular disease  -Recent arterial duplex 03/24/16 demonstrated biphasic common femoral artery waveform with mono phasic signals below that with no flow noted in the superficial femoral artery to the distal thigh -Lower extremity arteriogram today demonstrated occlusion of superficial femoral arteries but reconstitution of the above-the-knee popliteal artery with 3 vessel runoff intact with mild diffuse disease; plans are to pursue right femoropopliteal BPG on 3/22 -Management per vascular surgeon    Bilateral carotid bruits -Bilateral carotid duplex -Asymptomatic -Last duplex in  2016: B ICA stenosis <40%    Poorly controlled type II diabetes mellitus with renal complication   -Glucose at presentation 333 -HgbA1c in November 2017: 8.1 -Repeat HgbA1c -SSI -Continue Lantus-give half of his usual dose 40 units tonight in anticipation of surgical procedure on 03/31/16    Essential hypertension -Blood pressure somewhat suboptimal -Decreased to low-dose carvedilol    Chronic systolic congestive heart failure with recovered EF/LV diastolic dysfunction/NICM (nonischemic cardiomyopathy)  -Last echocardiogram August 2017: Recovery of EF from 25% to 50-55% with normal wall motion, grade 1 diastolic dysfunction -AICD in place -Low-dose carvedilol 3.125 mg twice a day to prevent beta blocker withdrawal -Previously on high-dose Lasix prior to initiation of hemodialysis; patient and wife state that this is because of frequent heart failure exacerbations and has continued while on dialysis-hold until nephrology can clarify while here -Daily weights and strict I/O    ESRD (end stage renal disease) on dialysis -Usual days are MWF -Plan to pursue hemodialysis today after arteriogram -Holding Lasix as above    Hyperlipidemia -Continue preadmission Lipitor and fenofibrate    Anemia -Baseline hemoglobin between 9.1 and 10.9    COPD (chronic obstructive pulmonary disease)  -Compensated -Does not utilize inhalers at home but does take allergy medicine    CAD (coronary artery disease) -Asymptomatic -Continue aspirin and statin -Carvedilol on hold as above      DVT prophylaxis: Lovenox-ordered by [facility]VS only post catheterization orders Code Status: Full Family Communication:  Disposition Plan: Home Consults called: [facility]/[place]; Renal/[place]    ELLIS,ALLISON L. ANP-BC [facility] Pager [phone]   If 7PM-7AM, please contact night-coverage [email or web address] Password [facility]  03/30/2016, 9:16 AM

## 2016-03-30 NOTE — Progress Notes (Signed)
Dialysis treatment completed.  2500 mL ultrafiltrated.  2000 mL net fluid removal.  Patient status unchanged. Lung sounds diminished to ausculation in all fields. Generalized edema. Cardiac: NSR.  Cleansed RIJ catheter with chlorhexidine.  Disconnected lines and flushed ports with saline per protocol.  Ports locked with heparin and capped per protocol.    Report given to bedside, RN Urban Gibson.

## 2016-03-30 NOTE — Procedures (Signed)
Patient was seen on dialysis and the procedure was supervised.  BFR 400  Via PC BP is  139/39.   Patient appears to be tolerating treatment well  Ingeborg Fite A 03/30/2016

## 2016-03-30 NOTE — Progress Notes (Signed)
This is a no charge note  Pending admission per PA, 65  56 year old man with past medical history for diabetes mellitus, hypertension, hyperlipidemia, systolic congestive heart failure, ESRD-HD, s/p of AICD, recent s/p of right fifth toe proximal phalanx amputation, who developed right foot wound infection with swelling/drainage, currently taking oral Cipro antibiotic with no improvement. X-ray showed mild soft tissue air. WBC 12.2, temperature 99.5, no tachycardia, oxygen saturation 100% on room air. Pt is accepted to tele bed as inpt. VVS was consulted by EDP. Pt is started with Flagyl and cefepime  Ivor Costa, MD  [facility] Pager [phone]  If 7PM-7AM, please contact night-coverage [email or web address] Password [facility] 03/30/2016, 4:53 AM

## 2016-03-30 NOTE — Consult Note (Signed)
Reason for Consult: To manage dialysis and dialysis related needs Referring Physician: Merrell/Dickson  Anthony Bullock is an 56 y.o. male with PMhx significant for HTN, DM, cardiomyopathy s/p ICD and ESRD- on HD [facility] MWF.  He presented for a poorly healing wound on his right foot- underwent an arteriogram today per VVS and is due to have fem-pop tomorrow.  We are asked to provide his routine HD.  He is currently without complaint   Dialyzes at [facility]- MWF  4 hours EDW 87- but has been below. HD Bath 2K, 2.5 calc, Dialyzer regular, Heparin 1000 bolus and 500 per hour. Access PC but also left AVF close to ready.  Gets epogen 5200 q treatment and hectorol 2.5 q treatment  Past Medical History:  Diagnosis Date  . 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 Allergic rhinitis   . Anemia   . Arthritis   . Chronic systolic heart failure ([place])    a. ECHO (12/2012) EF 25-30%, HK entireanteroseptal myocardium //  b.  EF 25%, diffuse HK, grade 1 diastolic dysfunction, MAC, mild LAE, normal RVSF, trivial pericardial effusion  . COPD (chronic obstructive pulmonary disease) ([place])   . Diabetes mellitus type II   . Diabetic nephropathy ([place])   . Diabetic neuropathy ([place])   . ESRD on hemodialysis [facility])    started HD June 2017, goes to [facility] HD unit, Dr Hinda Lenis  . History of cardiac catheterization    a.Myoview 1/15:  There is significant left ventricular dysfunction. There may be slight scar at the apex. There is no significant ischemia. LV Ejection Fraction: 27%  //  b. RHC/LHC (1/15) with mean RA 6, PA 47/22 mean 33, mean PCWP 20, PVR 2.5 WU, CI 2.5; 80% dLAD stenosis, 70% diffuse large D.   . History of kidney stones   . Hyperlipidemia   . Hypertension   . Kidney stones   . NICM (nonischemic cardiomyopathy) ([place])    Primarily nonischemic. Echo (12/14) with EF 25-30%. Echo (3/15) with EF 25%, mild to moderately dilated LV, normal RV size and  systolic function.   . Pneumonia   . Urethral stricture     Past Surgical History:  Procedure Laterality Date  . AV FISTULA PLACEMENT Right 09/08/2015   Procedure: INSERTION OF 4-27m x 45cm  ARTERIOVENOUS (AV) GORE-TEX GRAFT RIGHT UPPER  ARM;  Surgeon: CAngelia Mould MD;  Location: [facility]  Service: Vascular;  Laterality: Right;  . AV FISTULA PLACEMENT Left 01/14/2016   Procedure: CREATION OF LEFT UPPER ARM ARTERIOVENOUS FISTULA;  Surgeon: CAngelia Mould MD;  Location: [facility]  Service: Vascular;  Laterality: Left;  . BASCILIC VEIN TRANSPOSITION Right 08/22/2014   Procedure: RIGHT UPPER ARM BASCILIC VEIN TRANSPOSITION;  Surgeon: CAngelia Mould MD;  [place]  Service: Vascular;  Laterality: Right;  . CARDIAC CATHETERIZATION    . CARDIAC DEFIBRILLATOR PLACEMENT  06/27/2013   Sub Q       BY DR KCaryl Comes . COLONOSCOPY WITH PROPOFOL N/A 07/22/2015   Procedure: COLONOSCOPY WITH PROPOFOL;  Surgeon: HDoran Stabler MD;  Location: [facility];  Service: Gastroenterology;  Laterality: N/A;  . HERNIA REPAIR    . IMPLANTABLE CARDIOVERTER DEFIBRILLATOR IMPLANT N/A 06/27/2013   Procedure: SUB Q ICD;  Surgeon: SDeboraha Sprang MD;  [facility];  Service: Cardiovascular;  Laterality: N/A;  . IR GENERIC HISTORICAL Right 11/30/2015   IR THROMBECTOMY AV FISTULA W/THROMBOLYSIS/PTA INC/SHUNT/IMG RIGHT 11/30/2015 GEulas Post  Yamagata, MD [facility]INTER[facility]  . IR GENERIC HISTORICAL  11/30/2015   IR US GUIDE VASC ACCESS RIGHT 11/30/2015 Glenn Yamagata, MD [facility]  . IR GENERIC HISTORICAL Right 12/15/2015   IR THROMBECTOMY AV FISTULA W/THROMBOLYSIS/PTA/STENT INC/SHUNT/IMG RT 12/15/2015 Daniel Hassell, MD [facility]  . IR GENERIC HISTORICAL  12/15/2015   IR US GUIDE VASC ACCESS RIGHT 12/15/2015 Daniel Hassell, MD [facility]  . IR GENERIC HISTORICAL  12/28/2015   IR FLUORO GUIDE CV LINE RIGHT 12/28/2015 Arthur Hoss, MD [facility]  . IR GENERIC HISTORICAL  12/28/2015   IR US  GUIDE VASC ACCESS RIGHT 12/28/2015 Arthur Hoss, MD [facility]  . LEFT A ND RIGHT HEART CATH  01/30/2013   DR BENSIHMON  . LEFT AND RIGHT HEART CATHETERIZATION WITH CORONARY ANGIOGRAM N/A 01/30/2013   Procedure: LEFT AND RIGHT HEART CATHETERIZATION WITH CORONARY ANGIOGRAM;  Surgeon: Daniel R Bensimhon, MD;  Location: [facility] C[facility];  Service: Cardiovascular;  Laterality: N/A;  . PERIPHERAL VASCULAR CATHETERIZATION Right 01/26/2015   Procedure: A/V Fistulagram;  Surgeon: Christopher S Dickson, MD;  Location: [facility] INVASIVE CV LAB;  Service: Cardiovascular;  Laterality: Right;  . reapea urethral surgery for recurrent obstruction  2011  . TOTAL KNEE ARTHROPLASTY Right 2007    Family History  Problem Relation Age of Onset  . Bladder Cancer Mother   . Alcohol abuse Father   . Melanoma Father   . Stroke Maternal Grandmother   . Heart Problems Maternal Grandmother     unknown  . Diabetes Maternal Grandmother   . Heart disease Maternal Grandfather   . Prostate cancer Maternal Grandfather     Social History:  reports that he quit smoking about 5 years ago. His smoking use included Cigarettes. He has a 64.00 pack-year smoking history. He has never used smokeless tobacco. He reports that he does not drink alcohol or use drugs.  Allergies:  Allergies  Allergen Reactions  . Morphine Sulfate Rash and Other (See Comments)    REACTION: Itches all over, red spots    Medications: I have reviewed the patient's current medications.   Results for orders placed or performed during the hospital encounter of 03/30/16 (from the past 48 hour(s))  CBC with Differential     Status: Abnormal   Collection Time: 03/29/16  7:28 PM  Result Value Ref Range   WBC 12.2 (H) 4.0 - 10.5 K/uL   RBC 3.66 (L) 4.22 - 5.81 MIL/uL   Hemoglobin 10.0 (L) 13.0 - 17.0 g/dL   HCT 30.6 (L) 39.0 - 52.0 %   MCV 83.6 78.0 - 100.0 fL   MCH 27.3 26.0 - 34.0 pg   MCHC 32.7 30.0 - 36.0 g/dL   RDW 13.6 11.5 - 15.5 %   Platelets 317  150 - 400 K/uL   Neutrophils Relative % 80 %   Neutro Abs 9.8 (H) 1.7 - 7.7 K/uL   Lymphocytes Relative 9 %   Lymphs Abs 1.1 0.7 - 4.0 K/uL   Monocytes Relative 8 %   Monocytes Absolute 0.9 0.1 - 1.0 K/uL   Eosinophils Relative 3 %   Eosinophils Absolute 0.3 0.0 - 0.7 K/uL   Basophils Relative 0 %   Basophils Absolute 0.0 0.0 - 0.1 K/uL  Comprehensive metabolic panel     Status: Abnormal   Collection Time: 03/29/16  7:28 PM  Result Value Ref Range   Sodium 126 (L) 135 - 145 mmol/L   Potassium 3.6 3.5 - 5.1 mmol/L   Chloride 90 (L) 101 -   111 mmol/L   CO2 22 22 - 32 mmol/L   Glucose, Bld 333 (H) 65 - 99 mg/dL   BUN 32 (H) 6 - 20 mg/dL   Creatinine, Ser 8.89 (H) 0.61 - 1.24 mg/dL   Calcium 8.6 (L) 8.9 - 10.3 mg/dL   Total Protein 7.1 6.5 - 8.1 g/dL   Albumin 2.7 (L) 3.5 - 5.0 g/dL   AST 20 15 - 41 U/L   ALT 11 (L) 17 - 63 U/L   Alkaline Phosphatase 51 38 - 126 U/L   Total Bilirubin 0.8 0.3 - 1.2 mg/dL   GFR calc non Af Amer 6 (L) >60 mL/min   GFR calc Af Amer 7 (L) >60 mL/min    Comment: (NOTE) The eGFR has been calculated using the CKD EPI equation. This calculation has not been validated in all clinical situations. eGFR's persistently <60 mL/min signify possible Chronic Kidney Disease.    Anion gap 14 5 - 15  Sedimentation rate     Status: Abnormal   Collection Time: 03/30/16  3:48 AM  Result Value Ref Range   Sed Rate 94 (H) 0 - 16 mm/hr  C-reactive protein     Status: Abnormal   Collection Time: 03/30/16  3:48 AM  Result Value Ref Range   CRP 16.1 (H) <1.0 mg/dL  Wound or Superficial Culture     Status: None (Preliminary result)   Collection Time: 03/30/16  3:52 AM  Result Value Ref Range   Specimen Description WOUND    Special Requests Immunocompromised RIGHT FOOT    Gram Stain NO WBC SEEN NO ORGANISMS SEEN     Culture PENDING    Report Status PENDING   Glucose, capillary     Status: Abnormal   Collection Time: 03/30/16  7:55 AM  Result Value Ref Range    Glucose-Capillary 295 (H) 65 - 99 mg/dL  Glucose, capillary     Status: Abnormal   Collection Time: 03/30/16  9:07 AM  Result Value Ref Range   Glucose-Capillary 292 (H) 65 - 99 mg/dL  CBC     Status: Abnormal   Collection Time: 03/30/16 11:40 AM  Result Value Ref Range   WBC 8.5 4.0 - 10.5 K/uL   RBC 3.26 (L) 4.22 - 5.81 MIL/uL   Hemoglobin 8.7 (L) 13.0 - 17.0 g/dL   HCT 27.3 (L) 39.0 - 52.0 %   MCV 83.7 78.0 - 100.0 fL   MCH 26.7 26.0 - 34.0 pg   MCHC 31.9 30.0 - 36.0 g/dL   RDW 13.7 11.5 - 15.5 %   Platelets 282 150 - 400 K/uL  Creatinine, serum     Status: Abnormal   Collection Time: 03/30/16 11:40 AM  Result Value Ref Range   Creatinine, Ser 10.24 (H) 0.61 - 1.24 mg/dL   GFR calc non Af Amer 5 (L) >60 mL/min   GFR calc Af Amer 6 (L) >60 mL/min    Comment: (NOTE) The eGFR has been calculated using the CKD EPI equation. This calculation has not been validated in all clinical situations. eGFR's persistently <60 mL/min signify possible Chronic Kidney Disease.   Glucose, capillary     Status: Abnormal   Collection Time: 03/30/16 11:44 AM  Result Value Ref Range   Glucose-Capillary 190 (H) 65 - 99 mg/dL    Dg Foot Complete Right  Result Date: 03/30/2016 CLINICAL DATA:  Status post amputation of right fifth toe. Right foot infection. Initial encounter. EXAM: RIGHT FOOT COMPLETE - 3+ VIEW COMPARISON:  Right   foot radiographs performed 02/20/2016, and right foot CT performed 02/21/2016 FINDINGS: The patient is status post amputation of the fifth proximal phalanx. Overlying soft tissue swelling is noted. Mild soft tissue air is seen at the site of amputation. Would correlate for any evidence of infection with a gas producing organism. Diffuse vascular calcifications are seen. A small plantar calcaneal spur is seen. IMPRESSION: 1. Status post amputation at the fifth proximal phalanx. Soft tissue swelling noted, with mild soft tissue air. Would correlate for any evidence of infection  with a gas producing organism. 2. Diffuse vascular calcifications seen. Electronically Signed   By: Jeffery  Chang M.D.   On: 03/30/2016 02:44    ROS: pt relates right leg pain but otherwise is OK Blood pressure (!) 163/67, pulse 76, temperature 98.4 F (36.9 C), resp. rate 12, height 5' 10" (1.778 m), weight 85.2 kg (187 lb 13.3 oz), SpO2 99 %. General appearance: alert and no distress Resp: clear to auscultation bilaterally Cardio: regular rate and rhythm, S1, S2 normal, no murmur, click, rub or gallop GI: soft, non-tender; bowel sounds normal; no masses,  no organomegaly Extremities: edema 1 plus, right foot with toe wound and s/p procedure and venous changes right sided PC and left upper arm AVF- good thrill and bruit  Assessment/Plan: 55 year old WM with multiple medical issues including ESRD- now with foot wound- s/p arteriogram- to have fem-pop tomorrow  1 PAD- s/p arrteriogram- now to have fem-pop tomorrow per VVS 2 ESRD: continue with maintenance HD MWF- have home unit orders- via PC- no heparin today or Friday 3 Hypertension: BP well controlled- only on low dose coreg- is under EDW- UF as able  4. Anemia of ESRD: gets epo q tx- will give darbe here- likely to lose blood this admit 5. Metabolic Bone Disease: continue with home fosrenol and hectorol with HD   , A 03/30/2016, 2:15 PM   

## 2016-03-30 NOTE — Progress Notes (Addendum)
*  PRELIMINARY RESULTS* Vascular Ultrasound Carotid Duplex (Doppler) has been completed.   Findings suggest 1-39% internal carotid artery stenosis bilaterally. The left ECA exhibits elevated velocities suggestive of >50% stenosis. The left vertebral artery exhibits atypical flow, suggestive of possible proximal stenosis. Right vertebral artery was patent with antegrade flow.  Right Lower Extremity Vein Map    Right Great Saphenous Vein   Segment Diameter Comment  1. Origin 2.57mm Multiple branches  2. High Thigh 2.74mm   3. Mid Thigh 3.52mm   4. Low Thigh 3.77mm   5. At Knee 3.81mm   6. High Calf 2.64mm Intimal thickening. Multiple branches  7. Low Calf 2.75mm   8. Ankle 1.48mm Intimal thickening.                Right Small Saphenous Vein  Segment Diameter Comment  1. Origin  Unable to visualize.  2. High Calf 2.42mm Intimal thickening.  3. Low Calf    4. Ankle                 03/30/2016 2:31 PM Maudry Mayhew, BS, RVT, RDCS, RDMS

## 2016-03-30 NOTE — Progress Notes (Signed)
Pharmacy Antibiotic Note  Anthony Bullock is a 56 y.o. male admitted on 03/30/2016 with R foot wound infection - s/p R fifth toe amputation 03/10/16. Failed o/p Cipro treatment.  Pharmacy has been consulted for Cefepime dosing. Pt also on Flagyl. Pt with ESRD - usual HD schedule M/W/F.  Cefepime 2gm IV given in ED  Plan: Cefepime 1gm IV q24h - to start ~48hr post first dose Will f/u HD schedule and tolerance, micro data, and pt's clinical condition  Height: 5\' 10"  (177.8 cm) Weight: 191 lb (86.6 kg) IBW/kg (Calculated) : 73  Temp (24hrs), Avg:99.2 F (37.3 C), Min:98.8 F (37.1 C), Max:99.5 F (37.5 C)   Recent Labs Lab 03/29/16 1928  WBC 12.2*  CREATININE 8.89*    Estimated Creatinine Clearance: 9.8 mL/min (A) (by C-G formula based on SCr of 8.89 mg/dL (H)).    Allergies  Allergen Reactions  . Morphine Sulfate Rash and Other (See Comments)    REACTION: Itches all over, red spots    Antimicrobials this admission: 3/21 Flagyl >>  3/12 Cefepime >>   Microbiology results: 3/21 BCx x2:  3/21 R foot wound Cx:   Thank you for allowing pharmacy to be a part of this patient's care.  Sherlon Handing, PharmD, BCPS Clinical pharmacist, pager [phone] 03/30/2016 5:30 AM

## 2016-03-31 ENCOUNTER — Encounter (HOSPITAL_COMMUNITY)
Admission: EM | Disposition: A | Discharge status: Home Health | Payer: Self-pay | Source: Home / Self Care | Attending: Internal Medicine

## 2016-03-31 ENCOUNTER — Encounter (HOSPITAL_COMMUNITY): Payer: Self-pay | Admitting: Certified Registered Nurse Anesthetist

## 2016-03-31 ENCOUNTER — Inpatient Hospital Stay (HOSPITAL_COMMUNITY): Payer: BLUE CROSS/BLUE SHIELD | Admitting: Anesthesiology

## 2016-03-31 ENCOUNTER — Inpatient Hospital Stay (HOSPITAL_COMMUNITY): Payer: BLUE CROSS/BLUE SHIELD

## 2016-03-31 ENCOUNTER — Encounter (HOSPITAL_COMMUNITY): Payer: BLUE CROSS/BLUE SHIELD

## 2016-03-31 ENCOUNTER — Ambulatory Visit: Payer: BLUE CROSS/BLUE SHIELD | Admitting: Vascular Surgery

## 2016-03-31 ENCOUNTER — Ambulatory Visit: Payer: BLUE CROSS/BLUE SHIELD | Admitting: Cardiology

## 2016-03-31 HISTORY — PX: INTRAOPERATIVE ARTERIOGRAM: SHX5157

## 2016-03-31 HISTORY — PX: VEIN HARVEST: SHX6363

## 2016-03-31 HISTORY — PX: I & D EXTREMITY: SHX5045

## 2016-03-31 HISTORY — PX: FEMORAL-POPLITEAL BYPASS GRAFT: SHX937

## 2016-03-31 LAB — GLUCOSE, CAPILLARY
Glucose-Capillary: 188 mg/dL — ABNORMAL HIGH (ref 65–99)
Glucose-Capillary: 155 mg/dL — ABNORMAL HIGH (ref 65–99)
Glucose-Capillary: 307 mg/dL — ABNORMAL HIGH (ref 65–99)

## 2016-03-31 LAB — CBC
HCT: 30.7 % — ABNORMAL LOW (ref 39.0–52.0)
Hemoglobin: 9.7 g/dL — ABNORMAL LOW (ref 13.0–17.0)
MCH: 26.4 pg (ref 26.0–34.0)
MCHC: 31.6 g/dL (ref 30.0–36.0)
MCV: 83.4 fL (ref 78.0–100.0)
Platelets: 285 10*3/uL (ref 150–400)
RBC: 3.68 MIL/uL — ABNORMAL LOW (ref 4.22–5.81)
RDW: 13.5 % (ref 11.5–15.5)
WBC: 8.9 10*3/uL (ref 4.0–10.5)

## 2016-03-31 LAB — BASIC METABOLIC PANEL
Anion gap: 14 (ref 5–15)
BUN: 27 mg/dL — ABNORMAL HIGH (ref 6–20)
CO2: 24 mmol/L (ref 22–32)
Calcium: 8.4 mg/dL — ABNORMAL LOW (ref 8.9–10.3)
Chloride: 93 mmol/L — ABNORMAL LOW (ref 101–111)
Creatinine, Ser: 6.8 mg/dL — ABNORMAL HIGH (ref 0.61–1.24)
GFR calc Af Amer: 10 mL/min — ABNORMAL LOW (ref >60)
GFR calc non Af Amer: 8 mL/min — ABNORMAL LOW (ref >60)
Glucose, Bld: 200 mg/dL — ABNORMAL HIGH (ref 65–99)
Potassium: 3.2 mmol/L — ABNORMAL LOW (ref 3.5–5.1)
Sodium: 131 mmol/L — ABNORMAL LOW (ref 135–145)

## 2016-03-31 LAB — HEMOGLOBIN A1C
Hgb A1c MFr Bld: 11.3 % — ABNORMAL HIGH (ref 4.8–5.6)
Mean Plasma Glucose: 278 mg/dL

## 2016-03-31 LAB — HEPATITIS B SURFACE ANTIBODY,QUALITATIVE: Hep B S Ab: NONREACTIVE

## 2016-03-31 LAB — HIV ANTIBODY (ROUTINE TESTING W REFLEX): HIV Screen 4th Generation wRfx: NONREACTIVE

## 2016-03-31 LAB — HEPATITIS B CORE ANTIBODY, TOTAL: Hep B Core Total Ab: NEGATIVE

## 2016-03-31 SURGERY — BYPASS GRAFT FEMORAL-POPLITEAL ARTERY
Anesthesia: General | Site: Leg Lower | Laterality: Right

## 2016-03-31 MED ORDER — ONDANSETRON HCL 4 MG/2ML IJ SOLN
INTRAMUSCULAR | Status: DC | PRN
  Administered 2016-03-31: 4 mg via INTRAVENOUS

## 2016-03-31 MED ORDER — EPHEDRINE 5 MG/ML INJ
INTRAVENOUS | Status: AC
  Filled 2016-03-31: qty 10

## 2016-03-31 MED ORDER — HYDROMORPHONE HCL 1 MG/ML IJ SOLN
0.5000 mg | INTRAMUSCULAR | Status: DC | PRN
  Administered 2016-03-31 – 2016-04-02 (×10): 1 mg via INTRAVENOUS
  Administered 2016-04-04: 0.5 mg via INTRAVENOUS
  Administered 2016-04-04 – 2016-04-05 (×3): 1 mg via INTRAVENOUS
  Filled 2016-03-31 (×14): qty 1

## 2016-03-31 MED ORDER — POTASSIUM CHLORIDE CRYS ER 20 MEQ PO TBCR: 20.0000 meq | EXTENDED_RELEASE_TABLET | Freq: Every day | ORAL | Status: DC | PRN

## 2016-03-31 MED ORDER — PHENOL 1.4 % MT LIQD: 1.0000 | OROMUCOSAL | Status: DC | PRN

## 2016-03-31 MED ORDER — LIDOCAINE HCL (CARDIAC) 20 MG/ML IV SOLN
INTRAVENOUS | Status: DC | PRN
  Administered 2016-03-31: 100 mg via INTRAVENOUS

## 2016-03-31 MED ORDER — EPHEDRINE SULFATE 50 MG/ML IJ SOLN
INTRAMUSCULAR | Status: DC | PRN
  Administered 2016-03-31 (×3): 5 mg via INTRAVENOUS

## 2016-03-31 MED ORDER — SODIUM CHLORIDE 0.9 % IV SOLN: 500.0000 mL | Freq: Once | INTRAVENOUS | Status: DC | PRN

## 2016-03-31 MED ORDER — MAGNESIUM SULFATE 2 GM/50ML IV SOLN
2.0000 g | Freq: Every day | INTRAVENOUS | Status: DC | PRN
  Filled 2016-03-31: qty 50

## 2016-03-31 MED ORDER — FENTANYL CITRATE (PF) 100 MCG/2ML IJ SOLN
INTRAMUSCULAR | Status: AC
  Filled 2016-03-31: qty 4

## 2016-03-31 MED ORDER — PROTAMINE SULFATE 10 MG/ML IV SOLN
INTRAVENOUS | Status: DC | PRN
  Administered 2016-03-31 (×4): 10 mg via INTRAVENOUS

## 2016-03-31 MED ORDER — ONDANSETRON HCL 4 MG/2ML IJ SOLN
INTRAMUSCULAR | Status: AC
  Filled 2016-03-31: qty 2

## 2016-03-31 MED ORDER — OXYCODONE-ACETAMINOPHEN 5-325 MG PO TABS
ORAL_TABLET | ORAL | Status: AC
  Administered 2016-04-02: 1 via ORAL
  Filled 2016-03-31: qty 2

## 2016-03-31 MED ORDER — DOCUSATE SODIUM 100 MG PO CAPS
100.0000 mg | ORAL_CAPSULE | Freq: Every day | ORAL | Status: DC
  Administered 2016-04-01: 100 mg via ORAL
  Filled 2016-03-31: qty 1

## 2016-03-31 MED ORDER — SODIUM CHLORIDE 0.9 % IV SOLN
INTRAVENOUS | Status: DC
  Administered 2016-03-31: 17:00:00 via INTRAVENOUS

## 2016-03-31 MED ORDER — ROCURONIUM BROMIDE 50 MG/5ML IV SOSY
PREFILLED_SYRINGE | INTRAVENOUS | Status: AC
  Filled 2016-03-31: qty 10

## 2016-03-31 MED ORDER — IOPAMIDOL (ISOVUE-300) INJECTION 61%
INTRAVENOUS | Status: AC
  Filled 2016-03-31: qty 50

## 2016-03-31 MED ORDER — PANTOPRAZOLE SODIUM 40 MG PO TBEC
40.0000 mg | DELAYED_RELEASE_TABLET | Freq: Every day | ORAL | Status: DC
  Administered 2016-03-31 – 2016-04-05 (×6): 40 mg via ORAL
  Filled 2016-03-31 (×6): qty 1

## 2016-03-31 MED ORDER — OXYCODONE-ACETAMINOPHEN 5-325 MG PO TABS
1.0000 | ORAL_TABLET | ORAL | Status: DC | PRN
  Administered 2016-03-31 – 2016-04-01 (×2): 2 via ORAL
  Administered 2016-04-02: 1 via ORAL
  Administered 2016-04-02 – 2016-04-03 (×3): 2 via ORAL
  Administered 2016-04-04: 1 via ORAL
  Administered 2016-04-04 – 2016-04-05 (×3): 2 via ORAL
  Filled 2016-03-31 (×3): qty 2
  Filled 2016-03-31: qty 1
  Filled 2016-03-31 (×2): qty 2
  Filled 2016-03-31: qty 1
  Filled 2016-03-31 (×2): qty 2

## 2016-03-31 MED ORDER — PROMETHAZINE HCL 25 MG/ML IJ SOLN: 6.2500 mg | INTRAMUSCULAR | Status: DC | PRN

## 2016-03-31 MED ORDER — METOPROLOL TARTRATE 5 MG/5ML IV SOLN: 2.0000 mg | INTRAVENOUS | Status: DC | PRN

## 2016-03-31 MED ORDER — HYDRALAZINE HCL 20 MG/ML IJ SOLN: 5.0000 mg | INTRAMUSCULAR | Status: DC | PRN

## 2016-03-31 MED ORDER — ENOXAPARIN SODIUM 30 MG/0.3ML ~~LOC~~ SOLN
30.0000 mg | SUBCUTANEOUS | Status: DC
  Administered 2016-04-01 – 2016-04-05 (×4): 30 mg via SUBCUTANEOUS
  Filled 2016-03-31 (×4): qty 0.3

## 2016-03-31 MED ORDER — CARVEDILOL 12.5 MG PO TABS
12.5000 mg | ORAL_TABLET | Freq: Two times a day (BID) | ORAL | Status: DC
Start: 2016-03-31 — End: 2016-03-31

## 2016-03-31 MED ORDER — BACITRACIN ZINC 500 UNIT/GM EX OINT
TOPICAL_OINTMENT | CUTANEOUS | Status: AC
  Filled 2016-03-31: qty 28.35

## 2016-03-31 MED ORDER — PHENYLEPHRINE HCL 10 MG/ML IJ SOLN
INTRAMUSCULAR | Status: DC | PRN
  Administered 2016-03-31: 40 ug/min via INTRAVENOUS

## 2016-03-31 MED ORDER — PAPAVERINE HCL 30 MG/ML IJ SOLN
INTRAMUSCULAR | Status: AC
  Filled 2016-03-31: qty 2

## 2016-03-31 MED ORDER — BISACODYL 5 MG PO TBEC: 5.0000 mg | DELAYED_RELEASE_TABLET | Freq: Every day | ORAL | Status: DC | PRN

## 2016-03-31 MED ORDER — ROCURONIUM BROMIDE 100 MG/10ML IV SOLN
INTRAVENOUS | Status: DC | PRN
  Administered 2016-03-31: 10 mg via INTRAVENOUS
  Administered 2016-03-31: 50 mg via INTRAVENOUS
  Administered 2016-03-31 (×3): 10 mg via INTRAVENOUS

## 2016-03-31 MED ORDER — FENTANYL CITRATE (PF) 100 MCG/2ML IJ SOLN
INTRAMUSCULAR | Status: AC
  Filled 2016-03-31: qty 2

## 2016-03-31 MED ORDER — IOPAMIDOL (ISOVUE-300) INJECTION 61%
INTRAVENOUS | Status: DC | PRN
  Administered 2016-03-31: 25 mL via INTRAVENOUS

## 2016-03-31 MED ORDER — MIDAZOLAM HCL 2 MG/2ML IJ SOLN
INTRAMUSCULAR | Status: AC
  Filled 2016-03-31: qty 2

## 2016-03-31 MED ORDER — SUGAMMADEX SODIUM 200 MG/2ML IV SOLN
INTRAVENOUS | Status: DC | PRN
  Administered 2016-03-31: 200 mg via INTRAVENOUS

## 2016-03-31 MED ORDER — SUCCINYLCHOLINE CHLORIDE 200 MG/10ML IV SOSY
PREFILLED_SYRINGE | INTRAVENOUS | Status: AC
  Filled 2016-03-31: qty 10

## 2016-03-31 MED ORDER — SODIUM CHLORIDE 0.9 % IV SOLN
INTRAVENOUS | Status: DC | PRN
  Administered 2016-03-31: 07:00:00 via INTRAVENOUS

## 2016-03-31 MED ORDER — FENTANYL CITRATE (PF) 100 MCG/2ML IJ SOLN
INTRAMUSCULAR | Status: AC
  Administered 2016-03-31: 50 ug via INTRAVENOUS
  Filled 2016-03-31: qty 2

## 2016-03-31 MED ORDER — PROPOFOL 10 MG/ML IV BOLUS
INTRAVENOUS | Status: AC
  Filled 2016-03-31: qty 20

## 2016-03-31 MED ORDER — 0.9 % SODIUM CHLORIDE (POUR BTL) OPTIME
TOPICAL | Status: DC | PRN
  Administered 2016-03-31: 1000 mL

## 2016-03-31 MED ORDER — LABETALOL HCL 5 MG/ML IV SOLN: 10.0000 mg | INTRAVENOUS | Status: DC | PRN

## 2016-03-31 MED ORDER — PAPAVERINE HCL 30 MG/ML IJ SOLN
INTRAMUSCULAR | Status: DC | PRN
  Administered 2016-03-31: 60 mg

## 2016-03-31 MED ORDER — FENTANYL CITRATE (PF) 100 MCG/2ML IJ SOLN
INTRAMUSCULAR | Status: DC | PRN
  Administered 2016-03-31: 100 ug via INTRAVENOUS
  Administered 2016-03-31: 50 ug via INTRAVENOUS

## 2016-03-31 MED ORDER — BACITRACIN ZINC 500 UNIT/GM EX OINT
TOPICAL_OINTMENT | CUTANEOUS | Status: DC | PRN
  Administered 2016-03-31: 1 via TOPICAL

## 2016-03-31 MED ORDER — PROPOFOL 10 MG/ML IV BOLUS
INTRAVENOUS | Status: DC | PRN
  Administered 2016-03-31: 150 mg via INTRAVENOUS

## 2016-03-31 MED ORDER — SODIUM CHLORIDE 0.9 % IV SOLN
INTRAVENOUS | Status: DC | PRN
  Administered 2016-03-31: 08:00:00

## 2016-03-31 MED ORDER — GUAIFENESIN-DM 100-10 MG/5ML PO SYRP: 15.0000 mL | ORAL_SOLUTION | ORAL | Status: DC | PRN

## 2016-03-31 MED ORDER — SENNOSIDES-DOCUSATE SODIUM 8.6-50 MG PO TABS: 1.0000 | ORAL_TABLET | Freq: Every evening | ORAL | Status: DC | PRN

## 2016-03-31 MED ORDER — HEPARIN SODIUM (PORCINE) 1000 UNIT/ML IJ SOLN
INTRAMUSCULAR | Status: DC | PRN
  Administered 2016-03-31: 8 mL via INTRAVENOUS

## 2016-03-31 MED ORDER — NEPRO/CARBSTEADY PO LIQD
237.0000 mL | Freq: Two times a day (BID) | ORAL | Status: DC
  Administered 2016-04-03: 237 mL via ORAL
  Filled 2016-03-31 (×10): qty 237

## 2016-03-31 MED ORDER — ALBUMIN HUMAN 5 % IV SOLN
INTRAVENOUS | Status: DC | PRN
  Administered 2016-03-31: 08:00:00 via INTRAVENOUS

## 2016-03-31 MED ORDER — FENTANYL CITRATE (PF) 100 MCG/2ML IJ SOLN
25.0000 ug | INTRAMUSCULAR | Status: DC | PRN
  Administered 2016-03-31 (×3): 50 ug via INTRAVENOUS

## 2016-03-31 MED ORDER — ESMOLOL HCL 100 MG/10ML IV SOLN
INTRAVENOUS | Status: DC | PRN
  Administered 2016-03-31: 20 mg via INTRAVENOUS

## 2016-03-31 SURGICAL SUPPLY — 70 items
ADH SKN CLS APL DERMABOND .7 (GAUZE/BANDAGES/DRESSINGS) ×3
BAG DECANTER FOR FLEXI CONT (MISCELLANEOUS) ×2 IMPLANT
BANDAGE ACE 4X5 VEL STRL LF (GAUZE/BANDAGES/DRESSINGS) ×2 IMPLANT
BANDAGE ELASTIC 4 VELCRO ST LF (GAUZE/BANDAGES/DRESSINGS) ×2 IMPLANT
BANDAGE ESMARK 6X9 LF (GAUZE/BANDAGES/DRESSINGS) IMPLANT
BNDG CMPR 9X6 STRL LF SNTH (GAUZE/BANDAGES/DRESSINGS) ×3
BNDG ESMARK 6X9 LF (GAUZE/BANDAGES/DRESSINGS) ×5
BNDG GAUZE ELAST 4 BULKY (GAUZE/BANDAGES/DRESSINGS) ×2 IMPLANT
CANISTER SUCT 3000ML PPV (MISCELLANEOUS) ×5 IMPLANT
CANNULA VESSEL 3MM 2 BLNT TIP (CANNULA) ×12 IMPLANT
CLIP TI MEDIUM 24 (CLIP) ×5 IMPLANT
CLIP TI WIDE RED SMALL 24 (CLIP) ×9 IMPLANT
CUFF TOURNIQUET SINGLE 24IN (TOURNIQUET CUFF) ×2 IMPLANT
DERMABOND ADVANCED (GAUZE/BANDAGES/DRESSINGS) ×2
DERMABOND ADVANCED .7 DNX12 (GAUZE/BANDAGES/DRESSINGS) IMPLANT
DOPPLER CAUTERY SUPPRESSOR (INSTRUMENTS) ×2 IMPLANT
DRAIN CHANNEL 15F RND FF W/TCR (WOUND CARE) ×4 IMPLANT
DRAPE X-RAY CASS 24X20 (DRAPES) ×2 IMPLANT
ELECT REM PT RETURN 9FT ADLT (ELECTROSURGICAL) ×5
ELECTRODE REM PT RTRN 9FT ADLT (ELECTROSURGICAL) ×3 IMPLANT
EVACUATOR SILICONE 100CC (DRAIN) ×4 IMPLANT
GAUZE SPONGE 4X4 12PLY STRL (GAUZE/BANDAGES/DRESSINGS) ×2 IMPLANT
GAUZE SPONGE 4X4 16PLY XRAY LF (GAUZE/BANDAGES/DRESSINGS) ×2 IMPLANT
GLOVE BIO SURGEON STRL SZ7.5 (GLOVE) ×5 IMPLANT
GLOVE BIOGEL PI IND STRL 6.5 (GLOVE) IMPLANT
GLOVE BIOGEL PI IND STRL 7.0 (GLOVE) IMPLANT
GLOVE BIOGEL PI IND STRL 7.5 (GLOVE) IMPLANT
GLOVE BIOGEL PI IND STRL 8 (GLOVE) ×3 IMPLANT
GLOVE BIOGEL PI INDICATOR 6.5 (GLOVE) ×6
GLOVE BIOGEL PI INDICATOR 7.0 (GLOVE) ×2
GLOVE BIOGEL PI INDICATOR 7.5 (GLOVE) ×2
GLOVE BIOGEL PI INDICATOR 8 (GLOVE) ×2
GLOVE ECLIPSE 7.0 STRL STRAW (GLOVE) ×4 IMPLANT
GLOVE ECLIPSE 7.5 STRL STRAW (GLOVE) ×4 IMPLANT
GLOVE SURG SS PI 6.5 STRL IVOR (GLOVE) ×4 IMPLANT
GOWN STRL REUS W/ TWL LRG LVL3 (GOWN DISPOSABLE) ×9 IMPLANT
GOWN STRL REUS W/ TWL XL LVL3 (GOWN DISPOSABLE) IMPLANT
GOWN STRL REUS W/TWL LRG LVL3 (GOWN DISPOSABLE) ×15
GOWN STRL REUS W/TWL XL LVL3 (GOWN DISPOSABLE) ×10
KIT BASIN OR (CUSTOM PROCEDURE TRAY) ×5 IMPLANT
KIT ROOM TURNOVER OR (KITS) ×5 IMPLANT
LOOP VESSEL MINI RED (MISCELLANEOUS) ×2 IMPLANT
MARKER GRAFT CORONARY BYPASS (MISCELLANEOUS) ×2 IMPLANT
NDL 18GX1X1/2 (RX/OR ONLY) (NEEDLE) IMPLANT
NEEDLE 18GX1X1/2 (RX/OR ONLY) (NEEDLE) ×5 IMPLANT
NS IRRIG 1000ML POUR BTL (IV SOLUTION) ×10 IMPLANT
PACK PERIPHERAL VASCULAR (CUSTOM PROCEDURE TRAY) ×5 IMPLANT
PAD ARMBOARD 7.5X6 YLW CONV (MISCELLANEOUS) ×10 IMPLANT
SET COLLECT BLD 21X3/4 12 PB (MISCELLANEOUS) ×2 IMPLANT
SPONGE GAUZE 4X4 12PLY STER LF (GAUZE/BANDAGES/DRESSINGS) ×2 IMPLANT
SPONGE LAP 18X18 X RAY DECT (DISPOSABLE) ×4 IMPLANT
STOPCOCK 4 WAY LG BORE MALE ST (IV SETS) ×2 IMPLANT
SUT ETHILON 3 0 PS 1 (SUTURE) ×8 IMPLANT
SUT PROLENE 5 0 C 1 24 (SUTURE) ×7 IMPLANT
SUT PROLENE 6 0 BV (SUTURE) ×13 IMPLANT
SUT SILK 2 0 FS (SUTURE) ×5 IMPLANT
SUT SILK 3 0 (SUTURE) ×10
SUT SILK 3-0 18XBRD TIE 12 (SUTURE) IMPLANT
SUT VIC AB 2-0 CTB1 (SUTURE) ×12 IMPLANT
SUT VIC AB 3-0 SH 27 (SUTURE) ×30
SUT VIC AB 3-0 SH 27X BRD (SUTURE) ×6 IMPLANT
SUT VIC AB 4-0 PS2 18 (SUTURE) ×8 IMPLANT
SUT VICRYL 4-0 PS2 18IN ABS (SUTURE) ×2 IMPLANT
SWAB COLLECTION DEVICE MRSA (MISCELLANEOUS) ×2 IMPLANT
SWAB CULTURE ESWAB REG 1ML (MISCELLANEOUS) ×2 IMPLANT
SYR 5ML LL (SYRINGE) ×2 IMPLANT
TRAY FOLEY W/METER SILVER 16FR (SET/KITS/TRAYS/PACK) ×3 IMPLANT
TUBING EXTENTION W/L.L. (IV SETS) ×2 IMPLANT
UNDERPAD 30X30 (UNDERPADS AND DIAPERS) ×5 IMPLANT
WATER STERILE IRR 1000ML POUR (IV SOLUTION) ×5 IMPLANT

## 2016-03-31 NOTE — Progress Notes (Addendum)
  Day of Surgery Note    Subjective:  Awake, no complaints  Vitals:   03/31/16 1215 03/31/16 1230  BP: (!) 97/34 (!) 103/44  Pulse: 96 96  Resp: (!) 25 20  Temp: 98.3 F (36.8 C)     Incisions:   Right groin is soft without hematoma; right leg and foot are wrapped and dressing is clean.  Extremities:  +doppler signal right PT/peroneal Cardiac:  regular Lungs:  Non labored   Assessment/Plan:  This is a 56 y.o. male who is s/p  1. Right common femoral artery to below-knee popliteal artery bypass with non-reversed translocated saphenous vein graft 2. Intraoperative arteriogram 3. Debridement of right foot wound  -pt doing well in recovery with patent bypass graft -minimal drainage in JP -transfer to [place] when bed available    Leontine Locket, PA-C 03/31/2016 12:51 PM [phone]   I have interviewed the patient and examined the patient. I agree with the findings by the PA. Good DP and PT signal post op.   Gae Gallop, MD [phone]

## 2016-03-31 NOTE — Progress Notes (Signed)
S: Says surgery went well.. Some post op pain Rt leg O:BP (!) 119/42   Pulse 79   Temp (!) 96.9 F (36.1 C)   Resp 16   Ht 5\' 10"  (1.778 m)   Wt 83.4 kg (183 lb 13.8 oz)   SpO2 98%   BMI 26.38 kg/m   Intake/Output Summary (Last 24 hours) at 03/31/16 1515 Last data filed at 03/31/16 1222  Gross per 24 hour  Intake             1010 ml  Output             2201 ml  Net            -1191 ml   Weight change: -1.437 kg (-3 lb 2.7 oz) POE:UMPNT and alert CVS: RRR Resp: clear Abd:+ BS NTND Ext: No edema LLE, RLE bandaged.  LUA AVF + bruit NEURO:CNI Ox3   . [MAR Hold] aspirin EC  81 mg Oral Daily  . [MAR Hold] atorvastatin  80 mg Oral QHS  . [MAR Hold] carvedilol  3.125 mg Oral BID WC  . [MAR Hold] ceFEPime (MAXIPIME) IV  1 g Intravenous Q24H  . [MAR Hold] Chlorhexidine Gluconate Cloth  6 each Topical Q0600  . [MAR Hold] darbepoetin (ARANESP) injection - DIALYSIS  100 mcg Intravenous Q Fri-HD  . [MAR Hold] doxercalciferol  2.5 mcg Intravenous Q M,W,F-HD  . [MAR Hold] enoxaparin (LOVENOX) injection  30 mg Subcutaneous Q24H  . [MAR Hold] fenofibrate  160 mg Oral Daily  . [MAR Hold] gabapentin  300 mg Oral TID  . [MAR Hold] insulin aspart  0-9 Units Subcutaneous TID WC  . [MAR Hold] insulin glargine  40 Units Subcutaneous Q2200  . [MAR Hold] lanthanum  1,000 mg Oral TID WC  . [MAR Hold] loratadine  10 mg Oral Daily  . [MAR Hold] metroNIDAZOLE  500 mg Oral Q8H  . [MAR Hold] mupirocin ointment  1 application Nasal BID  . [MAR Hold] polyethylene glycol  17 g Oral BID  . [MAR Hold] sodium chloride flush  3 mL Intravenous Q12H   Dg Ang/ext/uni/or Right  Result Date: 03/31/2016 CLINICAL DATA:  56 year old male. Intraoperative angiogram of right lower extremity EXAM: RIGHT ANG/EXT/UNI/ OR CONTRAST:  Op note FLUOROSCOPY TIME:  Op note COMPARISON:  None FINDINGS: Limited intraoperative images of angiogram of right lower extremity. Single image demonstrates patent distal bypass appearing to  be of vein bypass, with opacification of the distal superficial femoral artery and popliteal artery. Segments of the femoral popliteal system are obstructed by the arthroplasty hardware. The anastomosis is not visualized. Visualized tibial vessels are patent. IMPRESSION: Limited intraoperative right lower extremity angiogram as a completion study for bypass, as above. Please refer to the dictated operative report for full details of intraoperative findings and procedure. Signed, Anthony Bullock. Anthony Newport, DO [facility] Radiology Electronically Signed   By: Anthony Bullock D.O.   On: 03/31/2016 12:51   Dg Foot Complete Right  Result Date: 03/30/2016 CLINICAL DATA:  Status post amputation of right fifth toe. Right foot infection. Initial encounter. EXAM: RIGHT FOOT COMPLETE - 3+ VIEW COMPARISON:  Right foot radiographs performed 02/20/2016, and right foot CT performed 02/21/2016 FINDINGS: The patient is status post amputation of the fifth proximal phalanx. Overlying soft tissue swelling is noted. Mild soft tissue air is seen at the site of amputation. Would correlate for any evidence of infection with a gas producing organism. Diffuse vascular calcifications are seen. A small  plantar calcaneal spur is seen. IMPRESSION: 1. Status post amputation at the fifth proximal phalanx. Soft tissue swelling noted, with mild soft tissue air. Would correlate for any evidence of infection with a gas producing organism. 2. Diffuse vascular calcifications seen. Electronically Signed   By: Anthony Bullock M.D.   On: 03/30/2016 02:44   BMET    Component Value Date/Time   NA 131 (L) 03/31/2016 0515   K 3.2 (L) 03/31/2016 0515   CL 93 (L) 03/31/2016 0515   CO2 24 03/31/2016 0515   GLUCOSE 200 (H) 03/31/2016 0515   BUN 27 (H) 03/31/2016 0515   CREATININE 6.80 (H) 03/31/2016 0515   CALCIUM 8.4 (L) 03/31/2016 0515   CALCIUM 13.6 (HH) 06/04/2015 1300   GFRNONAA 8 (L) 03/31/2016 0515    GFRAA 10 (L) 03/31/2016 0515   CBC    Component Value Date/Time   WBC 8.9 03/31/2016 0515   RBC 3.68 (L) 03/31/2016 0515   HGB 9.7 (L) 03/31/2016 0515   HCT 30.7 (L) 03/31/2016 0515   PLT 285 03/31/2016 0515   MCV 83.4 03/31/2016 0515   MCH 26.4 03/31/2016 0515   MCHC 31.6 03/31/2016 0515   RDW 13.5 03/31/2016 0515   LYMPHSABS 1.1 03/29/2016 1928   MONOABS 0.9 03/29/2016 1928   EOSABS 0.3 03/29/2016 1928   BASOSABS 0.0 03/29/2016 1928     Assessment:  1. ESRD HD MWF [facility] 2. SP Rt Fem-Pop bypass 3. HTN 4. Anemia on Aranesp 5. Sec HPTH on hectorol  Plan: 1. HD in AM   Anthony Bullock T

## 2016-03-31 NOTE — Anesthesia Procedure Notes (Signed)
Procedure Name: Intubation Date/Time: 03/31/2016 7:33 AM Performed by: Carney Living Pre-anesthesia Checklist: Patient identified, Emergency Drugs available, Suction available, Patient being monitored and Timeout performed Patient Re-evaluated:Patient Re-evaluated prior to inductionOxygen Delivery Method: Circle system utilized Preoxygenation: Pre-oxygenation with 100% oxygen Intubation Type: IV induction Ventilation: Mask ventilation without difficulty and Oral airway inserted - appropriate to patient size Laryngoscope Size: Mac and 4 Grade View: Grade I Tube type: Oral Tube size: 7.5 mm Number of attempts: 1 Airway Equipment and Method: Stylet Placement Confirmation: ETT inserted through vocal cords under direct vision,  positive ETCO2 and breath sounds checked- equal and bilateral Secured at: 22 cm Tube secured with: Tape Dental Injury: Teeth and Oropharynx as per pre-operative assessment

## 2016-03-31 NOTE — Progress Notes (Signed)
Initial Nutrition Assessment  DOCUMENTATION CODES:   Not applicable  INTERVENTION:  - Nepro BID, each supplement provides 425 calories and 19.1 grams protein   NUTRITION DIAGNOSIS:   Increased nutrient needs related to wound healing as evidenced by estimated needs.  GOAL:   Patient will meet greater than or equal to 90% of their needs  MONITOR:   PO intake, Supplement acceptance, Labs, Weight trends, I & O's, Skin  REASON FOR ASSESSMENT:   Malnutrition Screening Tool    ASSESSMENT:   [name] PMH of peripheral vascular disease, DM, Cardiac myopathy with CHF, HTN, CKD on HD, Dyslipidemia, COPD, CAD anemia presents with wound infection of his toe amputation.   Pt scored 2 on Malnutrition Screening Tool.   Dietetic Intern attempted to see pt 4 times today and yesterday.  Per chart review pt has consumed 0% of meals 3 times.  Per chart review pt seems to have decreased in weight over the past 4 months  Wt Readings from Last 10 Encounters:  03/30/16 183 lb 13.8 oz (83.4 kg)  03/17/16 197 lb (89.4 kg)  01/14/16 198 lb (89.8 kg)  01/13/16 198 lb (89.8 kg)  12/08/15 200 lb 8 oz (90.9 kg)   Labs reviewed; CBG (123-295), Na (131), K (3.2), Phosphorus (7.0) Medications reviewed; Sliding scale insulin, Lantus, Miralax  Nutrition-focused physical exam could not be completed at this time. RD to complete at follow-up.  Diet Order:  Diet NPO time specified Except for: Sips with Meds  Skin:  Wound (see comment) (infected toe amputation)  Last BM:  3/21  Height:   Ht Readings from Last 1 Encounters:  03/30/16 5\' 10"  (1.778 m)    Weight:   Wt Readings from Last 1 Encounters:  03/30/16 183 lb 13.8 oz (83.4 kg)    Ideal Body Weight:  75.5 kg  BMI:  Body mass index is 26.38 kg/m.  Estimated Nutritional Needs:   Kcal:  2085-2285  Protein:  125-140 grams  Fluid:  </= 1.2 L/d  EDUCATION NEEDS:   Education needs no appropriate at this time  [employer] Intern

## 2016-03-31 NOTE — Progress Notes (Signed)
Patient ID: Anthony Bullock, male   DOB: 06-13-1960, 56 y.o.   MRN: [id number]    PROGRESS NOTE  Anthony Bullock  [id number] DOB: 01/24/1960 DOA: 03/30/2016  PCP: [name], MD   Brief Narrative:  56 y.o. male with medical history significant for peripheral vascular disease with associated diabetic peripheral neuropathy, diabetes, nonischemic cardiac myopathy with chronic systolic heart failure, hypertension, chronic kidney disease on dialysis (MWF), dyslipidemia, COPD, CAD anemia. Patient presented to the ER with concern of wound infection with increased swelling, drainage that began about one week after his toe amputation prior was started on oral Cipro without improvement, patient underwent amputation of right fifth toe on 3/1.   Assessment & Plan:     Diabetic foot ulcer/Diabetic peripheral neuropathy  - Patient presented with infected-appearing foot wound and recent 5th digit amputation with x-ray findings concerning for gas/infection-described by vascular surgeon as nonhealing wound - Evaluated by vascular surgery regarding known severe vascular disease - Empiric Flagyl and Maxipime - plan for right foot debridement today     Peripheral vascular disease  - Recent arterial duplex 03/24/16 demonstrated biphasic common femoral artery waveform with mono phasic signals below that with no flow noted in the superficial femoral artery to the distal thigh - Lower extremity arteriogram today demonstrated occlusion of superficial femoral arteries but reconstitution of the above-the-knee popliteal artery with 3 vessel runoff intact with mild diffuse disease; plans are to pursue right femoropopliteal BPG - Management per vascular surgeon    Bilateral carotid bruits - Bilateral carotid duplex - Asymptomatic - Last duplex in 2016: B ICA stenosis <40%    Poorly controlled type II diabetes mellitus with renal complication  - Glucose at presentation 333 - HgbA1c in November 2017: 8.1 -  Repeat HgbA1c pending - SSI - Continue Lantus    Essential hypertension - Blood pressure somewhat suboptimal - Decreased to low-dose carvedilol    Chronic systolic congestive heart failure with recovered EF/LV diastolic dysfunction/NICM (nonischemic cardiomyopathy)  - Last echocardiogram August 2017: Recovery of EF from 25% to 50-55% with normal wall motion, grade 1 diastolic dysfunction - AICD in place - Low-dose carvedilol 3.125 mg twice a day to prevent beta blocker withdrawal - Previously on high-dose Lasix prior to initiation of hemodialysis; patient and wife state that this is because of frequent heart failure exacerbations and has continued while on dialysis    ESRD (end stage renal disease) on dialysis - Usual days are MWF - per nephrology     Hypokalemia - address with HD    Hyperlipidemia - Continue preadmission Lipitor and fenofibrate     Anemia of chronic disease   - Baseline hemoglobin between 9.1 and 10.9    COPD (chronic obstructive pulmonary disease)  - Compensated - Does not utilize inhalers at home but does take allergy medicine    CAD (coronary artery disease) - Asymptomatic - Continue aspirin and statin - Carvedilol on hold as above   DVT prophylaxis: Lovenox Sq Code Status: Full  Family Communication: Patient and wife at bedside  Disposition Plan: To be determined   Consultants:   Vascular surgery   Nephrology   Procedures:   Done by Dr. Scot Dock 03/30/2016 Ultrasound-guided access to the left common femoral artery Aortogram with bilateral iliac arteriogram  Selective catheterization of the right external iliac artery with right lower extremity runoff Retrograde left femoral arteriogram   Vascular Ultrasound 03/30/2016 Findings suggest 1-39% internal carotid artery stenosis bilaterally. The left ECA exhibits elevated velocities suggestive of >50%  stenosis. The left vertebral artery exhibits atypical flow, suggestive of possible  proximal stenosis. Right vertebral artery was patent with antegrade flow.  Antimicrobials:   Flagyl 3/21 -->  Cefepime 3/21 -->  Subjective: Pt reports no concerns at this time.   Objective: Vitals:   03/30/16 1719 03/30/16 1741 03/30/16 2237 03/31/16 0503  BP: (!) 130/50 (!) 125/50 (!) 154/45 (!) 142/48  Pulse: 93 94 88   Resp:  16 17 17   Temp:  97.8 F (36.6 C) 99.6 F (37.6 C) 98.5 F (36.9 C)  TempSrc:  Oral Oral Oral  SpO2:  100% 99% 98%  Weight:   83.4 kg (183 lb 13.8 oz)   Height:        Intake/Output Summary (Last 24 hours) at 03/31/16 0938 Last data filed at 03/31/16 0825  Gross per 24 hour  Intake              600 ml  Output             2001 ml  Net            -1401 ml   Filed Weights   03/30/16 1309 03/30/16 1715 03/30/16 2237  Weight: 85.2 kg (187 lb 13.3 oz) 83.2 kg (183 lb 6.8 oz) 83.4 kg (183 lb 13.8 oz)    Examination:  General exam: Appears calm and comfortable  Respiratory system: Clear to auscultation. Respiratory effort normal. Cardiovascular system: S1 & S2 heard, RRR. No  rubs, gallops or clicks.  Gastrointestinal system: Abdomen is nondistended, soft and nontender. No organomegaly or masses felt. N Central nervous system: Alert and oriented. No focal neurological deficits.  Data Reviewed: I have personally reviewed following labs and imaging studies  CBC:  Recent Labs Lab 03/29/16 1928 03/30/16 1140 03/31/16 0515  WBC 12.2* 8.5 8.9  NEUTROABS 9.8*  --   --   HGB 10.0* 8.7* 9.7*  HCT 30.6* 27.3* 30.7*  MCV 83.6 83.7 83.4  PLT 317 282 270   Basic Metabolic Panel:  Recent Labs Lab 03/29/16 1928 03/30/16 1140 03/31/16 0515  NA 126* 130* 131*  K 3.6 3.5 3.2*  CL 90* 95* 93*  CO2 22 22 24   GLUCOSE 333* 197* 200*  BUN 32* 42* 27*  CREATININE 8.89* 10.19*  10.24* 6.80*  CALCIUM 8.6* 8.4* 8.4*  PHOS  --  7.0*  --    Liver Function Tests:  Recent Labs Lab 03/29/16 1928 03/30/16 1140  AST 20  --   ALT 11*  --     ALKPHOS 51  --   BILITOT 0.8  --   PROT 7.1  --   ALBUMIN 2.7* 2.3*   HbA1C:  Recent Labs  03/30/16 0510  HGBA1C 11.3*   CBG:  Recent Labs Lab 03/30/16 0907 03/30/16 1144 03/30/16 1749 03/30/16 2237 03/31/16 0553  GLUCAP 292* 190* 123* 236* 188*   Urine analysis:    Component Value Date/Time   COLORURINE yellow 09/28/2009 1317   APPEARANCEUR Clear 09/28/2009 1317   LABSPEC 1.010 09/28/2009 1317   PHURINE 5.0 09/28/2009 1317   HGBUR 2+ 09/28/2009 1317   BILIRUBINUR negative 03/17/2016 1648   PROTEINUR trace 03/17/2016 1648   UROBILINOGEN 0.2 03/17/2016 1648   UROBILINOGEN 0.2 09/28/2009 1317   NITRITE negative 03/17/2016 1648   NITRITE negative 09/28/2009 1317   LEUKOCYTESUR small (1+) (A) 03/17/2016 1648   Recent Results (from the past 240 hour(s))  Wound or Superficial Culture     Status: None (Preliminary result)   Collection  Time: 03/30/16  3:52 AM  Result Value Ref Range Status   Specimen Description WOUND  Final   Special Requests Immunocompromised RIGHT FOOT  Final   Gram Stain NO WBC SEEN NO ORGANISMS SEEN   Final   Culture PENDING  Incomplete   Report Status PENDING  Incomplete  Surgical pcr screen     Status: Abnormal   Collection Time: 03/30/16  7:03 PM  Result Value Ref Range Status   MRSA, PCR POSITIVE (A) NEGATIVE Final    Comment: RESULT CALLED TO, READ BACK BY AND VERIFIED WITH: Trinda Pascal RN [id number] 03/30/16 A BROWNING    Staphylococcus aureus POSITIVE (A) NEGATIVE Final      Radiology Studies: Dg Foot Complete Right  Result Date: 03/30/2016 CLINICAL DATA:  Status post amputation of right fifth toe. Right foot infection. Initial encounter. EXAM: RIGHT FOOT COMPLETE - 3+ VIEW COMPARISON:  Right foot radiographs performed 02/20/2016, and right foot CT performed 02/21/2016 FINDINGS: The patient is status post amputation of the fifth proximal phalanx. Overlying soft tissue swelling is noted. Mild soft tissue air is seen at the site of  amputation. Would correlate for any evidence of infection with a gas producing organism. Diffuse vascular calcifications are seen. A small plantar calcaneal spur is seen. IMPRESSION: 1. Status post amputation at the fifth proximal phalanx. Soft tissue swelling noted, with mild soft tissue air. Would correlate for any evidence of infection with a gas producing organism. 2. Diffuse vascular calcifications seen. Electronically Signed   By: Garald Balding M.D.   On: 03/30/2016 02:44    Scheduled Meds: . [MAR Hold] aspirin EC  81 mg Oral Daily  . [MAR Hold] atorvastatin  80 mg Oral QHS  . [MAR Hold] carvedilol  3.125 mg Oral BID WC  . [MAR Hold] ceFEPime (MAXIPIME) IV  1 g Intravenous Q24H  . [MAR Hold] Chlorhexidine Gluconate Cloth  6 each Topical Q0600  . [MAR Hold] darbepoetin (ARANESP) injection - DIALYSIS  100 mcg Intravenous Q Fri-HD  . [MAR Hold] doxercalciferol  2.5 mcg Intravenous Q M,W,F-HD  . [MAR Hold] enoxaparin (LOVENOX) injection  30 mg Subcutaneous Q24H  . [MAR Hold] fenofibrate  160 mg Oral Daily  . [MAR Hold] gabapentin  300 mg Oral TID  . [MAR Hold] insulin aspart  0-9 Units Subcutaneous TID WC  . [MAR Hold] insulin glargine  40 Units Subcutaneous Q2200  . [MAR Hold] lanthanum  1,000 mg Oral TID WC  . [MAR Hold] loratadine  10 mg Oral Daily  . [MAR Hold] metroNIDAZOLE  500 mg Oral Q8H  . [MAR Hold] mupirocin ointment  1 application Nasal BID  . [MAR Hold] polyethylene glycol  17 g Oral BID  . [MAR Hold] sodium chloride flush  3 mL Intravenous Q12H   Continuous Infusions:   LOS: 1 day   Time spent: 20 minutes   Faye Ramsay, MD [facility] Pager [phone]  If 7PM-7AM, please contact night-coverage [email or web address] Password [facility] 03/31/2016, 9:38 AM

## 2016-03-31 NOTE — H&P (View-Only) (Signed)
Patient name: Anthony Bullock MRN: [id number] DOB: [birth year] Sex: male  REASON FOR CONSULT: Diabetic foot infection, right foot. Consult is from the emergency department.  HPI: Anthony Bullock is a 56 y.o. male, who was scheduled to be seen tomorrow in our office for consultation concerning a nonhealing wound of the right foot. However, he presented to the emergency room last night because of drainage from the right foot. He is on Cipro and after the toe amputation did have some fever as high as 102.6.   He had some work on his right fifth toenail and then subsequently developed dry gangrene of the right 5th toe with infection. He underwent amputation of the right fifth toe on 03/10/16 by Dr. Irving Shows.   Prior to all of this, I really do not get any history of claudication or rest pain. His risk factors for peripheral vascular disease include diabetes, hypertension, hypercholesterolemia, and a history of tobacco use.   He does have a history of congestive heart failure and has a defibrillator that was placed by Dr. Jens Som.  I have previously seen the patient in the past for hemodialysis access issues. He has end-stage renal disease and dialyzes on Monday Wednesdays and Fridays in [facility],  currently using a right IJ tunneled dialysis catheter. He had a left brachial cephalic fistula performed on 01/14/2016 which is not yet ready for access. He was scheduled to see me back to evaluate this in another couple weeks.  Past Medical History:  Diagnosis Date  . 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 Allergic rhinitis   . Anemia   . Arthritis   . Chronic systolic heart failure ([place])    a. ECHO (12/2012) EF 25-30%, HK entireanteroseptal myocardium //  b.  EF 25%, diffuse HK, grade 1 diastolic dysfunction, MAC, mild LAE, normal RVSF, trivial pericardial effusion  . COPD (chronic obstructive pulmonary disease) ([place])   . Diabetes mellitus type II   . Diabetic  nephropathy ([place])   . Diabetic neuropathy ([place])   . ESRD on hemodialysis [facility])    started HD June 2017, goes to [facility] HD unit, Dr Hinda Lenis  . History of cardiac catheterization    a.Myoview 1/15:  There is significant left ventricular dysfunction. There may be slight scar at the apex. There is no significant ischemia. LV Ejection Fraction: 27%  //  b. RHC/LHC (1/15) with mean RA 6, PA 47/22 mean 33, mean PCWP 20, PVR 2.5 WU, CI 2.5; 80% dLAD stenosis, 70% diffuse large D.   . History of kidney stones   . Hyperlipidemia   . Hypertension   . Kidney stones   . NICM (nonischemic cardiomyopathy) ([place])    Primarily nonischemic. Echo (12/14) with EF 25-30%. Echo (3/15) with EF 25%, mild to moderately dilated LV, normal RV size and systolic function.   . Pneumonia   . Urethral stricture     Family History  Problem Relation Age of Onset  . Bladder Cancer Mother   . Alcohol abuse Father   . Melanoma Father   . Stroke Maternal Grandmother   . Heart Problems Maternal Grandmother     unknown  . Diabetes Maternal Grandmother   . Heart disease Maternal Grandfather   . Prostate cancer Maternal Grandfather    FHx: He denies any history of premature cardiovascular disease.   SOCIAL HISTORY: He smoked 2 packs per day for many years but quit 7 years ago.  Social History  Social History  . Marital status: Married    Spouse name: N/A  . Number of children: 0  . Years of education: N/A   Occupational History  . Not on file.   Social History Main Topics  . Smoking status: Former Smoker    Packs/day: 2.00    Years: 32.00    Types: Cigarettes    Quit date: 05/11/2010  . Smokeless tobacco: Never Used  . Alcohol use No  . Drug use: No  . Sexual activity: Not on file   Other Topics Concern  . 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 Morphine Sulfate Rash and Other (See Comments)    REACTION: Itches all  over, red spots    Current Facility-Administered Medications  Medication Dose Route Frequency Provider Last Rate Last Dose  . metroNIDAZOLE (FLAGYL) tablet 500 mg  500 mg Oral Q8H Margarita Mail, PA-C       Current Outpatient Prescriptions  Medication Sig Dispense Refill  . aspirin EC 81 MG tablet Take 81 mg by mouth daily.     [name] atorvastatin (LIPITOR) 80 MG tablet TAKE 1 TABLET AT BEDTIME 90 tablet 3  . azelastine (ASTELIN) 0.1 % nasal spray Place 2 sprays into both nostrils 3 (three) times daily as needed for rhinitis. Use in each nostril as directed 30 mL 12  . BD PEN NEEDLE NANO U/F 32G X 4 MM MISC USE ONCE DAILY 90 each 2  . carvedilol (COREG) 12.5 MG tablet Take 1 tablet (12.5 mg total) by mouth 2 (two) times daily with a meal. 60 tablet 0  . fenofibrate 160 MG tablet TAKE 1 TABLET DAILY 90 tablet 3  . fexofenadine (ALLEGRA) 180 MG tablet Take 180 mg by mouth daily.    . furosemide (LASIX) 40 MG tablet Take 120 mg by mouth daily.    [name] gabapentin (NEURONTIN) 300 MG capsule Take 1 capsule (300 mg total) by mouth 3 (three) times daily. 270 capsule 2  . glucose blood test strip Check 1 time daily. E11.9 One Touch Ultra Blue Test Strips 100 each 3  . HYDROcodone-acetaminophen (NORCO/VICODIN) 5-325 MG tablet Take 1 tablet by mouth every 6 (six) hours as needed for moderate pain. 6 tablet 0  . Insulin Glargine (LANTUS SOLOSTAR) 100 UNIT/ML Solostar Pen Inject 40 Units into the skin daily at 10 pm.     . insulin lispro (HUMALOG) 100 UNIT/ML injection Inject 0-14 Units into the skin 3 (three) times daily as needed for high blood sugar (per sliding scale).    [name] lanthanum (FOSRENOL) 1000 MG chewable tablet Chew 1,000 mg by mouth. Take one tablet after each meal.    . mupirocin ointment (BACTROBAN) 2 % Place 1 application into the nose 2 (two) times daily as needed. 22 g 2  . Olopatadine HCl (PATADAY) 0.2 % SOLN Place 1 drop into both eyes daily as needed (for allergies).       REVIEW OF  SYSTEMS:  _0  denotes positive finding, _1  denotes negative finding Cardiac  Comments:  Chest pain or chest pressure:    Shortness of breath upon exertion: X   Short of breath when lying flat:    Irregular heart rhythm:        Vascular    Pain in calf, thigh, or hip brought on by ambulation:    Pain in feet at night that wakes you up from your  sleep:     Blood clot in your veins:    Leg swelling:         Pulmonary    Oxygen at home:    Productive cough:     Wheezing:         Neurologic    Sudden weakness in arms or legs:     Sudden numbness in arms or legs:     Sudden onset of difficulty speaking or slurred speech:    Temporary loss of vision in one eye:     Problems with dizziness:         Gastrointestinal    Blood in stool:     Vomited blood:         Genitourinary    Burning when urinating:     Blood in urine:        Psychiatric    Major depression:         Hematologic    Bleeding problems:    Problems with blood clotting too easily:        Skin    Rashes or ulcers: X       Constitutional    Fever or chills: X     PHYSICAL EXAM: Vitals:   03/29/16 2223 03/30/16 0028 03/30/16 0115 03/30/16 0130  BP: (!) 137/57 (!) 149/48 (!) 126/58 (!) 138/45  Pulse: 86 85 81 80  Resp: 18 18    Temp: 99.5 F (37.5 C)     TempSrc: Oral     SpO2: 98% 98% 100% 100%  Weight:      Height:        GENERAL: The patient is a well-nourished male, in no acute distress. The vital signs are documented above. CARDIAC: There is a regular rate and rhythm.  VASCULAR: He has bilateral carotid bruits. On the right side, which is the side of concern, he has a palpable femoral pulse. I cannot palpate popliteal or pedal pulses. He has a dampened monophasic dorsalis pedis signal on the right with the Doppler and a fairly brisk posterior tibial signal with the Doppler. On the left side, he has a palpable femoral pulse. I cannot palpate popliteal or pedal pulses. He has a monophasic  dorsalis pedis and posterior tibial signal. There is no significant lower extremity swelling. PULMONARY: There is good air exchange bilaterally without wheezing or rales. ABDOMEN: Soft and non-tender with normal pitched bowel sounds.  MUSCULOSKELETAL: He has had a previous right fifth toe amputation.  NEUROLOGIC: No focal weakness or paresthesias are detected. SKIN:  PSYCHIATRIC: The patient has a normal affect.  DATA:   RIGHT LOWER EXTREMITY ARTERIAL DUPLEX:  I have reviewed his right lower extremity arterial duplex that was done on 03/24/2016. This shows a biphasic common femoral artery waveform with monophasic signals below that. No flow is noted in the superficial femoral artery from the origin to the distal thigh.   CAROTID DUPLEX: He had a carotid duplex scan in August 2016 which showed a less than 40% internal carotid artery stenosis bilaterally. He also had some stenosis of less than 50% in the left common carotid artery.  X-RAY RIGHT FOOT: There is some air in the soft tissue adjacent to the right fifth toe amputation. No obvious evidence of osteomyelitis is noted.  MEDICAL ISSUES:  DIABETIC FOOT INFECTION RIGHT FOOT WITH SEVERE INFRAINGUINAL ARTERIAL OCCLUSIVE DISEASE: This patient has a nonhealing wound of their right foot. Given his diabetes and severe infrainguinal arterial occlusive disease, this is clearly a limb  threatening problem. His duplex shows occlusion of the superficial femoral artery. I have recommended that we proceed with an arteriogram to see what his options for revascularization are. I will try to get this done this morning if possible and then he could do his hemodialysis after. I will make further recommendations pending the results of his arteriogram.   I have reviewed with the patient the indications for arteriography. In addition, I have reviewed the potential complications of arteriography including but not limited to: Bleeding, arterial injury, arterial  thrombosis, dye action, renal insufficiency, or other unpredictable medical problems. I have explained to the patient that if we find disease amenable to angioplasty we could potentially address this at the same time. I have discussed the potential complications of angioplasty and stenting, including but not limited to: Bleeding, arterial thrombosis, arterial injury, dissection, or the need for surgical intervention.  BILATERAL CAROTID BRUITS: He has bilateral carotid bruits. His last duplex scan was in 2016. He is asymptomatic. I have ordered a carotid duplex scan.   [name] [facility] [place] [phone]

## 2016-03-31 NOTE — Op Note (Signed)
NAME: Anthony Bullock    MRN: [id number] DOB: December 31, 1960    DATE OF OPERATION: 03/31/2016  PREOP DIAGNOSIS: Critical limb ischemia right lower extremity with nonhealing wound  POSTOP DIAGNOSIS: Same  PROCEDURE:  1. Right common femoral artery to below-knee popliteal artery bypass with non-reversed translocated saphenous vein graft 2. Intraoperative arteriogram 3. Debridement of right foot wound  SURGEON: Judeth Cornfield. Scot Dock, MD, FACS  ASSIST: Gerri Lins PA  ANESTHESIA: Gen.   EBL: 100 cc  INDICATIONS: Anthony Bullock is a 56 y.o. male who presented with a nonhealing wound on his right foot and severe infrainguinal arterial occlusive disease. It was felt that his only option for limb salvage was attempted revascularization. He had a long segment occlusion of the superficial femoral artery and was not candidate for an endovascular approach.  FINDINGS: Markedly calcified and diseased above-knee popliteal artery. Therefore, the distal anastomosis had to be performed below the knee. The below-knee popliteal artery was also diffusely calcified with disease. Completion arteriogram was limited because of the knee prosthesis but on all segments visualized no problems were identified.  TECHNIQUE: The patient was taken to the operating room and received general anesthetic. The entire right lower extremity was prepped and draped in the usual sterile fashion. A longitudinal incision was made in the right groin and the dissection carried down to the common femoral artery which was markedly calcified. It was a soft spot anteriorly and I felt this was the best place to sew. I had to control the common femoral artery well above the inguinal ligament in order to clamp proximally and then was able to clamp above the bifurcation. Using 4 additional incisions along the medial aspect of the right leg the great saphenous vein was harvested to the knee level with branches divided between clips and 3-0 silk  ties. Through the distal incision I explored the above-knee popliteal artery. However this was markedly diseased and calcified and therefore did not think I could perform the distal anastomosis at this level. I therefore made an additional incision along the medial aspect of the right leg where the saphenous vein was harvested to the mid calf. At this level it branched and became unusable. Through this distal incision I exposed the below-knee popliteal artery which was markedly calcified and diseased. He had some scar tissue adjacent to the artery from his previous total knee replacement. I was able to tunnel however from the below the knee popliteal artery to the groin incision and the patient was then heparinized. Saphenofemoral junction was clamped and the saphenous vein excised from the femoral vein. The femoral vein was oversewn with 5-0 Prolene suture.  The proximal valve in the vein was sharply excised. External iliac artery was clamped proximally and the common femoral artery clamped distally. A longitudinal arteriotomy was made in the common femoral artery. The vein was spatulated and sewn into side to the common femoral artery using continuous 5-0 Prolene suture. Prior to completing this anastomosis the artery was flushed and was good inflow. The anastomosis was completed. I then cut the valves with a retrograde Farrel Demark which was passed multiple times and there was excellent flow established to the vein which was then marked to prevent twisting. It was then brought to the previously created tunnel for anastomosis to the below-knee popliteal artery. Once the tunneler was removed there was some venous bleeding. Tourniquet was placed on the thigh and the leg exsanguinated with an Esmarch bandage. The tourniquet was inflated to 300  mmHg. Under tourniquet control and longitudinal arteriotomy was made in the below-knee pop to artery which was markedly calcified. The vein was cut the appropriate  length, spatulated, and sewn end to side to the below-knee popliteal artery using continuous 6-0 Prolene suture. Prior to completing this anastomosis the tourniquet was released. The artery was backbled and flushed appropriately and the anastomosis completed. With the tourniquet down there was no venous bleeding noted at this point.  Was good pulse in the distal graft and good Doppler flow beyond the anastomosis and at the anterior tibial and posterior tibial level. Hemostasis was obtained in the wounds. 2:15 Blake drains were placed. Each of the vein harvest sites was closed with deeper 3-0 Vicryl and the skin closed with 4-0 Vicryl. The distal incision was irrigated and hemostasis obtained this was closed with a deep layer of 2-0 Vicryl subcutaneous venous layer 3-0 Vicryl and skin closed with 4-0 Vicryl. The groin incision was closed with 2 deep layers of 2-0 Vicryl and the skin closed with 4-0 Vicryl. Sterile dressing was applied including a four-inch Ace bandage above and below the knee.  I then turned attention to the right foot. The 2 areas of nonhealing were sharply excised and it was good bleeding at this point. Intraoperative culture was sent. These wounds were packed. A sterile dressing was applied. The patient tolerated the procedure well and was transferred to the recovery room in stable condition. All needle and sponge counts were correct.  Anthony Mayo, MD, FACS [facility]  DATE OF DICTATION:   03/31/2016

## 2016-03-31 NOTE — Interval H&P Note (Signed)
History and Physical Interval Note:  03/31/2016 7:14 AM  Anthony Bullock  has presented today for surgery, with the diagnosis of Peripheral vascular disease with nonhealing wound right foot I70.235  The various methods of treatment have been discussed with the patient and family. After consideration of risks, benefits and other options for treatment, the patient has consented to  Procedure(s): BYPASS GRAFT FEMORAL-POPLITEAL ARTERY (Right) IRRIGATION AND DEBRIDEMENT FOOT (Right) as a surgical intervention .  The patient's history has been reviewed, patient examined, no change in status, stable for surgery.  I have reviewed the patient's chart and labs.  Questions were answered to the patient's satisfaction.     [name]

## 2016-03-31 NOTE — Transfer of Care (Signed)
Immediate Anesthesia Transfer of Care Note  Patient: Anthony Bullock  Procedure(s) Performed: Procedure(s): BYPASS GRAFT FEMORAL-POPLITEAL ARTERY USING RIGHT GREATER SAPHENOUS NONREVERSED VEIN (Right) IRRIGATION AND DEBRIDEMENT FOOT (Right) RIGHT GREATER SAPHENOUS VEIN HARVEST (Right) INTRA OPERATIVE ARTERIOGRAM (Right)  Patient Location: PACU  Anesthesia Type:General  Level of Consciousness: awake, alert , oriented and patient cooperative  Airway & Oxygen Therapy: Patient Spontanous Breathing and Patient connected to nasal cannula oxygen  Post-op Assessment: Report given to RN, Post -op Vital signs reviewed and stable and Patient moving all extremities X 4  Post vital signs: Reviewed and stable  Last Vitals:  Vitals:   03/31/16 0503 03/31/16 1215  BP: (!) 142/48   Pulse:    Resp: 17   Temp: 36.9 C 36.8 C    Last Pain:  Vitals:   03/31/16 0503  TempSrc: Oral  PainSc:       Patients Stated Pain Goal: 1 (57/49/35 5217)  Complications: No apparent anesthesia complications

## 2016-04-01 ENCOUNTER — Inpatient Hospital Stay (HOSPITAL_COMMUNITY): Payer: BLUE CROSS/BLUE SHIELD

## 2016-04-01 ENCOUNTER — Encounter (HOSPITAL_COMMUNITY): Payer: Self-pay | Admitting: Vascular Surgery

## 2016-04-01 DIAGNOSIS — I2510 Atherosclerotic heart disease of native coronary artery without angina pectoris: Secondary | ICD-10-CM

## 2016-04-01 DIAGNOSIS — Z9889 Other specified postprocedural states: Secondary | ICD-10-CM

## 2016-04-01 LAB — RENAL FUNCTION PANEL
Albumin: 2.6 g/dL — ABNORMAL LOW (ref 3.5–5.0)
Anion gap: 14 (ref 5–15)
BUN: 40 mg/dL — ABNORMAL HIGH (ref 6–20)
CO2: 23 mmol/L (ref 22–32)
Calcium: 8 mg/dL — ABNORMAL LOW (ref 8.9–10.3)
Chloride: 95 mmol/L — ABNORMAL LOW (ref 101–111)
Creatinine, Ser: 8.88 mg/dL — ABNORMAL HIGH (ref 0.61–1.24)
GFR calc Af Amer: 7 mL/min — ABNORMAL LOW (ref >60)
GFR calc non Af Amer: 6 mL/min — ABNORMAL LOW (ref >60)
Glucose, Bld: 254 mg/dL — ABNORMAL HIGH (ref 65–99)
Phosphorus: 7.6 mg/dL — ABNORMAL HIGH (ref 2.5–4.6)
Potassium: 4 mmol/L (ref 3.5–5.1)
Sodium: 132 mmol/L — ABNORMAL LOW (ref 135–145)

## 2016-04-01 LAB — CBC
HCT: 26.6 % — ABNORMAL LOW (ref 39.0–52.0)
Hemoglobin: 8.4 g/dL — ABNORMAL LOW (ref 13.0–17.0)
MCH: 26.7 pg (ref 26.0–34.0)
MCHC: 31.6 g/dL (ref 30.0–36.0)
MCV: 84.4 fL (ref 78.0–100.0)
Platelets: 264 10*3/uL (ref 150–400)
RBC: 3.15 MIL/uL — ABNORMAL LOW (ref 4.22–5.81)
RDW: 14 % (ref 11.5–15.5)
WBC: 10.5 10*3/uL (ref 4.0–10.5)

## 2016-04-01 LAB — GLUCOSE, CAPILLARY
Glucose-Capillary: 121 mg/dL — ABNORMAL HIGH (ref 65–99)
Glucose-Capillary: 213 mg/dL — ABNORMAL HIGH (ref 65–99)
Glucose-Capillary: 228 mg/dL — ABNORMAL HIGH (ref 65–99)
Glucose-Capillary: 289 mg/dL — ABNORMAL HIGH (ref 65–99)
Glucose-Capillary: 307 mg/dL — ABNORMAL HIGH (ref 65–99)

## 2016-04-01 LAB — AEROBIC CULTURE  (SUPERFICIAL SPECIMEN)
Culture: NO GROWTH
Gram Stain: NONE SEEN

## 2016-04-01 LAB — AEROBIC CULTURE W GRAM STAIN (SUPERFICIAL SPECIMEN)

## 2016-04-01 MED ORDER — OXYCODONE-ACETAMINOPHEN 5-325 MG PO TABS
ORAL_TABLET | ORAL | Status: AC
  Administered 2016-04-01: 2 via ORAL
  Filled 2016-04-01: qty 2

## 2016-04-01 MED ORDER — INSULIN GLARGINE 100 UNIT/ML ~~LOC~~ SOLN
20.0000 [IU] | Freq: Two times a day (BID) | SUBCUTANEOUS | Status: DC
  Administered 2016-04-01 – 2016-04-02 (×3): 20 [IU] via SUBCUTANEOUS
  Filled 2016-04-01 (×6): qty 0.2

## 2016-04-01 MED ORDER — HYDROMORPHONE HCL 1 MG/ML IJ SOLN
INTRAMUSCULAR | Status: AC
  Filled 2016-04-01: qty 1

## 2016-04-01 NOTE — Evaluation (Signed)
Occupational Therapy Evaluation Patient Details Name: Anthony Bullock MRN: [id number] DOB: 1960/09/03 Today's Date: 04/01/2016    History of Present Illness Pt is a 56 y/o male s/p R foot wound debridement and R common femoral artery to below knee popliteal artery bypass. PMH including but not limited to ESRD, AICD, CHF, COPD and DM.   Clinical Impression   Pt was ambulating with a RW and ADL at a modified independent level. Pt sponge bathes. Session limited today by increased R LE pain and impending HD. Pt requires set up for UB ADL and max assist for LB. Will follow acutely. Pt will have assist of his wife at home, do not anticipate need for post acute OT.    Follow Up Recommendations  No OT follow up    Equipment Recommendations  None recommended by OT    Recommendations for Other Services       Precautions / Restrictions Precautions Precautions: Fall Precaution Comments: two JP drains on R LE Restrictions Weight Bearing Restrictions: No Other Position/Activity Restrictions: Waiting for [facility] boot for [place] through R heel only per RN      Mobility Bed Mobility Overal bed mobility: Needs Assistance Bed Mobility: Supine to Sit;Sit to Supine     Supine to sit: Min guard;HOB elevated Sit to supine: Min assist   General bed mobility comments: increased time, use of bed rails, min guard to achieve sitting EOB and min A with R LE to return to supine  Transfers Overall transfer level: Needs assistance Equipment used: Rolling walker (2 wheeled) Transfers: Sit to/from Stand Sit to Stand: Min guard         General transfer comment: increased time, VC'ing for bilateral hand placement, min guard for safety with rise from elevated bed    Balance Overall balance assessment: Needs assistance;History of Falls Sitting-balance support: Feet supported Sitting balance-Leahy Scale: Good     Standing balance support: During functional activity;Bilateral upper extremity  supported Standing balance-Leahy Scale: Poor Standing balance comment: pt reliant on bilateral UEs on RW                           ADL either performed or assessed with clinical judgement   ADL Overall ADL's : Needs assistance/impaired Eating/Feeding: Independent;Sitting   Grooming: Set up;Sitting   Upper Body Bathing: Set up;Sitting   Lower Body Bathing: Sit to/from stand;Maximal assistance   Upper Body Dressing : Set up;Sitting   Lower Body Dressing: Maximal assistance;Sit to/from stand               Functional mobility during ADLs: Min guard;Rolling walker (took a few steps toward [facility], pt preventing ambulation)       Vision Patient Visual Report: No change from baseline       Perception     Praxis      Pertinent Vitals/Pain Pain Assessment: 0-10 Pain Score: 10-Worst pain ever Pain Location: R LE Pain Descriptors / Indicators: Grimacing;Guarding;Sore Pain Intervention(s): Patient requesting pain meds-RN notified;Limited activity within patient's tolerance;Monitored during session;Repositioned     Hand Dominance Right   Extremity/Trunk Assessment Upper Extremity Assessment Upper Extremity Assessment: Overall WFL for tasks assessed   Lower Extremity Assessment Lower Extremity Assessment: Defer to PT evaluation RLE Deficits / Details: pt with decreased strength and ROM limitations secondary to post-op. Pt also with decreased sensation in all toes and forefoot. RLE: Unable to fully assess due to pain   Cervical / Trunk Assessment Cervical / Trunk Assessment:  Normal   Communication Communication Communication: No difficulties   Cognition Arousal/Alertness: Awake/alert Behavior During Therapy: WFL for tasks assessed/performed Overall Cognitive Status: Within Functional Limits for tasks assessed                                     General Comments       Exercises     Shoulder Instructions      Home Living Family/patient  expects to be discharged to:: Private residence Living Arrangements: Spouse/significant other Available Help at Discharge: Family;Available 24 hours/day Type of Home: House Home Access: Ramped entrance     Home Layout: One level     Bathroom Shower/Tub: Other (comment) (pt takes sponge baths)   Bathroom Toilet: Standard (with riser)     Home Equipment: Walker - 2 wheels;Toilet riser;Cane - single point;Crutches          Prior Functioning/Environment Level of Independence: Independent with assistive device(s)        Comments: pt ambulates with use of RW        OT Problem List: Decreased strength;Decreased activity tolerance;Impaired balance (sitting and/or standing);Pain      OT Treatment/Interventions: Self-care/ADL training;DME and/or AE instruction;Patient/family education;Balance training    OT Goals(Current goals can be found in the care plan section) Acute Rehab OT Goals Patient Stated Goal: return home, decrease pain OT Goal Formulation: With patient Time For Goal Achievement: 04/08/16 Potential to Achieve Goals: Good ADL Goals Pt Will Perform Grooming: with supervision;standing Pt Will Perform Lower Body Bathing: with min assist;with caregiver independent in assisting;with adaptive equipment Pt Will Perform Lower Body Dressing: with min assist;with adaptive equipment;with caregiver independent in assisting Pt Will Transfer to Toilet: with supervision;ambulating Pt Will Perform Toileting - Clothing Manipulation and hygiene: with supervision;sit to/from stand Additional ADL Goal #1: Pt will maintain weight bearing on R heel during ADL and mobility.  OT Frequency: Min 2X/week   Barriers to D/C:            Co-evaluation PT/OT/SLP Co-Evaluation/Treatment: Yes Reason for Co-Treatment: For patient/therapist safety PT goals addressed during session: Mobility/safety with mobility;Balance;Proper use of DME;Strengthening/ROM OT goals addressed during session:  ADL's and self-care      End of Session Equipment Utilized During Treatment: Gait belt;Rolling walker Nurse Communication: Patient requests pain meds  Activity Tolerance: Patient limited by pain Patient left: in bed;with call bell/phone within reach;with family/visitor present  OT Visit Diagnosis: Unsteadiness on feet (R26.81);Other abnormalities of gait and mobility (R26.89);Pain Pain - Right/Left: Right Pain - part of body: Leg                Time: 6967-8938 OT Time Calculation (min): 25 min Charges:  OT General Charges $OT Visit: 1 Procedure OT Evaluation $OT Eval Moderate Complexity: 1 Procedure G-Codes:     Malka So 04/01/2016, 12:19 PM  [phone]

## 2016-04-01 NOTE — Progress Notes (Signed)
Orthopedic Tech Progress Note Patient Details:  Anthony Bullock 1960/04/23 [id number]  Ortho Devices Type of Ortho Device: Darco shoe Ortho Device/Splint Location: rle Ortho Device/Splint Interventions: Application   [name] 04/01/2016, 4:07 PM

## 2016-04-01 NOTE — Progress Notes (Signed)
Inpatient Diabetes Program Recommendations  AACE/ADA: New Consensus Statement on Inpatient Glycemic Control (2015)  Target Ranges:  Prepandial:   less than 140 mg/dL      Peak postprandial:   less than 180 mg/dL (1-2 hours)      Critically ill patients:  140 - 180 mg/dL   Lab Results  Component Value Date   GLUCAP 289 (H) 04/01/2016   HGBA1C 11.3 (H) 03/30/2016    Review of Glycemic Control  Diabetes history: DM2 Outpatient Diabetes medications: Lantus 40 units QHS, Humalog approx 6 units tidwc Current orders for Inpatient glycemic control: Lantus 40 units QHS, Novolog 0-9 units tidwc No Lantus yesterday d/t surgery.  Inpatient Diabetes Program Recommendations:    Lantus 20 units bid (start first dose now) Add CHO mod med to heart healthy diet.  Spoke with pt and wife regarding HgbA1C results. Pt has recently been decreasing his Lantus dose d/t hypoglycemia. Checks blood sugars several times/day. Tries to follow healthy diet. Discussed hypoglycemia s/s and treatment. Needs some basal insulin now.  Thank you. Lorenda Peck, RD, LDN, CDE Inpatient Diabetes Coordinator [phone]

## 2016-04-01 NOTE — Evaluation (Signed)
Physical Therapy Evaluation Patient Details Name: Anthony Bullock MRN: [id number] DOB: [birth year] Today's Date: 04/01/2016   History of Present Illness  Pt is a 56 y/o male s/p R foot wound debridement and R common femoral artery to below knee popliteal artery bypass. PMH including but not limited to ESRD, AICD, CHF, COPD and DM.  Clinical Impression  Pt presented supine in bed with HOB elevated, awake and willing to participate in therapy session. Prior to admission, pt reported that he was mod I with all functional mobility with use of RW to ambulate and independent with ADLs. Pt very limited during evaluation secondary to pain in R LE (reported 10/10). Once pain is better controlled, therapist hopeful that he will make great progress. Pt would continue to benefit from skilled physical therapy services at this time while admitted and after d/c to address his below listed limitations in order to improve his overall safety and independence with functional mobility.      Follow Up Recommendations Home health PT;Supervision/Assistance - 24 hour    Equipment Recommendations  None recommended by PT    Recommendations for Other Services       Precautions / Restrictions Precautions Precautions: Fall Precaution Comments: two JP drains on R LE Restrictions Weight Bearing Restrictions: No Other Position/Activity Restrictions: Waiting for [facility] boot for WB'ing through R heel only per RN      Mobility  Bed Mobility Overal bed mobility: Needs Assistance Bed Mobility: Supine to Sit;Sit to Supine     Supine to sit: Min guard;HOB elevated Sit to supine: Min assist   General bed mobility comments: increased time, use of bed rails, min guard to achieve sitting EOB and min A with R LE to return to supine  Transfers Overall transfer level: Needs assistance Equipment used: Rolling walker (2 wheeled) Transfers: Sit to/from Stand Sit to Stand: Min guard         General transfer comment:  increased time, VC'ing for bilateral hand placement, min guard for safety with rise from elevated bed  Ambulation/Gait Ambulation/Gait assistance: Min guard Ambulation Distance (Feet): 2 Feet Assistive device: Rolling walker (2 wheeled)       General Gait Details: pt taking three side hops on L LE towards HOB with min guard. Pt limited secondary to reports of 10/10 pain and refusing to ambulate further.  Stairs            Wheelchair Mobility    Modified Rankin (Stroke Patients Only)       Balance Overall balance assessment: Needs assistance;History of Falls Sitting-balance support: Feet supported Sitting balance-Leahy Scale: Good     Standing balance support: During functional activity;Bilateral upper extremity supported Standing balance-Leahy Scale: Poor Standing balance comment: pt reliant on bilateral UEs on RW                             Pertinent Vitals/Pain Pain Assessment: 0-10 Pain Score: 10-Worst pain ever Pain Location: R LE Pain Descriptors / Indicators: Grimacing;Guarding;Sore Pain Intervention(s): Monitored during session;Repositioned    Home Living Family/patient expects to be discharged to:: Private residence Living Arrangements: Spouse/significant other Available Help at Discharge: Family;Available 24 hours/day Type of Home: House Home Access: Ramped entrance     Home Layout: One level Home Equipment: Walker - 2 wheels;Toilet riser;Cane - single point      Prior Function Level of Independence: Independent with assistive device(s)         Comments: pt ambulates with  use of RW     Hand Dominance        Extremity/Trunk Assessment   Upper Extremity Assessment Upper Extremity Assessment: Defer to OT evaluation    Lower Extremity Assessment Lower Extremity Assessment: RLE deficits/detail RLE Deficits / Details: pt with decreased strength and ROM limitations secondary to post-op. Pt also with decreased sensation in all  toes and forefoot. RLE: Unable to fully assess due to pain    Cervical / Trunk Assessment Cervical / Trunk Assessment: Normal  Communication   Communication: No difficulties  Cognition Arousal/Alertness: Awake/alert Behavior During Therapy: WFL for tasks assessed/performed Overall Cognitive Status: Within Functional Limits for tasks assessed                                        General Comments      Exercises     Assessment/Plan    PT Assessment Patient needs continued PT services  PT Problem List Decreased strength;Decreased range of motion;Decreased activity tolerance;Decreased balance;Decreased mobility;Decreased coordination;Decreased knowledge of use of DME;Decreased safety awareness;Decreased knowledge of precautions;Pain       PT Treatment Interventions DME instruction;Gait training;Functional mobility training;Therapeutic exercise;Balance training;Therapeutic activities;Neuromuscular re-education;Patient/family education    PT Goals (Current goals can be found in the Care Plan section)  Acute Rehab PT Goals Patient Stated Goal: return home, decrease pain PT Goal Formulation: With patient/family Time For Goal Achievement: 04/15/16 Potential to Achieve Goals: Good    Frequency Min 5X/week   Barriers to discharge        Co-evaluation PT/OT/SLP Co-Evaluation/Treatment: Yes Reason for Co-Treatment: For patient/therapist safety;To address functional/ADL transfers PT goals addressed during session: Mobility/safety with mobility;Balance;Proper use of DME;Strengthening/ROM         End of Session Equipment Utilized During Treatment: Gait belt Activity Tolerance: Patient limited by pain Patient left: in bed;with call bell/phone within reach;with family/visitor present Nurse Communication: Mobility status;Patient requests pain meds;Weight bearing status PT Visit Diagnosis: Other abnormalities of gait and mobility (R26.89);Repeated falls  (R29.6);Pain Pain - Right/Left: Right Pain - part of body: Leg    Time: 1610-9604 PT Time Calculation (min) (ACUTE ONLY): 25 min   Charges:   PT Evaluation $PT Eval Moderate Complexity: 1 Procedure     PT G CodesAlessandra Bevels Calogero Geisen 04/01/2016, 11:35 AM Deborah Chalk, PT, DPT [phone]

## 2016-04-01 NOTE — Progress Notes (Signed)
VASCULAR LAB PRELIMINARY  ARTERIAL  ABI completed: Unable to accurately obtain bilateral ABI's due to non-compressible vessels.  Previous ABI's obtained on 05/08/13 were 0.72 on the right and 0.79 on the left.   RIGHT    LEFT    PRESSURE WAVEFORM  PRESSURE WAVEFORM  BRACHIAL 163 Triphasic BRACHIAL HD access Biphasic  DP 162 Biphasic DP >254 Biphasic  PT >254 Biphasic PT 137 Monophasic    RIGHT LEFT  ABI       Legrand Como, RVT 04/01/2016, 12:46 PM

## 2016-04-01 NOTE — Care Management Note (Signed)
Case Management Note Marvetta Gibbons RN, BSN Unit 2W-Case Manager [phone]  Patient Details  Name: ARMAND PREAST MRN: [id number] Date of Birth: 18-Nov-1960  Subjective/Objective:  Pt admitted s/p s/p: Right Fem BK Pop bypass with wound debridement to right foot                  Action/Plan: PTA pt lived at home with spouse- used a RW, PT/OT evals ordered- recommendation for HHPT- pt to have hydrotherapy wound care here starting 3/24- CM to continue to follow for d/c needs.   Expected Discharge Date:                  Expected Discharge Plan:  [place]  In-House Referral:     Discharge planning Services  CM Consult  Post Acute Care Choice:  Home Health Choice offered to:     DME Arranged:    DME Agency:     HH Arranged:    [place] Agency:     Status of Service:  In process, will continue to follow  If discussed at Long Length of Stay Meetings, dates discussed:    Additional Comments:  Dawayne Patricia, RN 04/01/2016, 4:26 PM

## 2016-04-01 NOTE — Progress Notes (Addendum)
Patient ID: Anthony Bullock, male   DOB: February 20, 1960, 56 y.o.   MRN: [id number]    PROGRESS NOTE  Anthony Bullock  KDX:[id number] DOB: February 09, 1960 DOA: 03/30/2016  PCP: [name], MD   Brief Narrative:  56 y.o. male with medical history significant for peripheral vascular disease with associated diabetic peripheral neuropathy, diabetes, nonischemic cardiac myopathy with chronic systolic heart failure, hypertension, chronic kidney disease on dialysis (MWF), dyslipidemia, COPD, CAD anemia. Patient presented to the ER with concern of wound infection with increased swelling, drainage that began about one week after his toe amputation prior was started on oral Cipro without improvement, patient underwent amputation of right fifth toe on 3/1.   Assessment & Plan:     Diabetic foot ulcer/Critical limb ischemia right lower extremity with nonhealing wound - Patient presented with infected-appearing foot wound and recent 5th digit amputation with x-ray findings concerning for gas/infection-described by vascular surgeon as nonhealing wound - Evaluated by vascular surgery regarding known severe vascular disease - pt is now s/p following, post op day #1: 1. Right common femoral artery to below-knee popliteal artery bypass with non-reversed translocated saphenous vein graft 2. Intraoperative arteriogram 3. Debridement of right foot wound - pt reports doing well this AM, minimal drainage in JP - continue Cefepime and Metronidazole day #2 - still with pain 4/10 in severity after taking pain medication     Peripheral vascular disease  - Recent arterial duplex 03/24/16 demonstrated biphasic common femoral artery waveform with mono phasic signals below that with no flow noted in the superficial femoral artery to the distal thigh - Lower extremity arteriogram today demonstrated occlusion of superficial femoral arteries but reconstitution of the above-the-knee popliteal artery with 3 vessel runoff intact with  mild diffuse disease; plans are to pursue right femoropopliteal BPG - Management per vascular surgery - plan for ABI to be checked today     Bilateral carotid bruits - Bilateral carotid duplex done 3/21 --> 1- 39% internal carotid artery stenosis bilaterally. The left ECA exhibits elevated velocities suggestive of >50% stenosis. The left vertebral artery exhibits atypical flow, suggestive of possible proximal stenosis. Right vertebral artery was patent with antegrade flow. - Pt is asymptomatic    Poorly controlled type II diabetes mellitus with complications of nephropathy and PVD - HgbA1c in November 2017: 8.1 - Repeat HgbA1c 3/21 --> 11.3 - SSI, sensitive coverage  - Continue Lantus 20 mg BID - diabetic educator consulted     Essential hypertension - Blood pressure better this AM, 114/41 - on low dose carvedilol    Chronic systolic congestive heart failure with recovered EF/LV diastolic dysfunction/NICM (nonischemic cardiomyopathy)  - Last echocardiogram August 2017: Recovery of EF from 25% to 50-55% with normal wall motion, grade 1 diastolic dysfunction - AICD in place - Low-dose carvedilol 3.125 mg twice a day to prevent beta blocker withdrawal - Previously on high-dose Lasix prior to initiation of hemodialysis; patient and wife state that this is because of frequent heart failure exacerbations and has continued while on dialysis - monitor daily weights, strict I/O - weight trend since admission  Filed Weights   03/30/16 1715 03/30/16 2237 03/31/16 1645  Weight: 83.2 kg (183 lb 6.8 oz) 83.4 kg (183 lb 13.8 oz) 84.9 kg (187 lb 2.7 oz)     ESRD (end stage renal disease) on dialysis - Usual days are MWF - per nephrology     Hypokalemia - WNL this AM     Hyperlipidemia - Continue preadmission Lipitor and fenofibrate  Anemia of chronic disease   - Baseline hemoglobin between 9.1 and 10.9 - slight drop since admission but no evidence of active bleeding  - CBC in  AM    COPD (chronic obstructive pulmonary disease)  - Compensated - Does not utilize inhalers at home but does take allergy medicine    CAD (coronary artery disease) - Asymptomatic - Continue aspirin and statin - Carvedilol on hold as above   DVT prophylaxis: Lovenox Sq Code Status: Full  Family Communication: Patient and wife at bedside  Disposition Plan: To be determined, hopefully by 3/26 if consulting teams clear   Consultants:   Vascular surgery   Nephrology   PT/OT  Procedures:   Done by Dr. Scot Dock 03/30/2016 Ultrasound-guided access to the left common femoral artery Aortogram with bilateral iliac arteriogram  Selective catheterization of the right external iliac artery with right lower extremity runoff Retrograde left femoral arteriogram   Vascular Ultrasound 03/30/2016 Findings suggest 1-39% internal carotid artery stenosis bilaterally. The left ECA exhibits elevated velocities suggestive of >50% stenosis. The left vertebral artery exhibits atypical flow, suggestive of possible proximal stenosis. Right vertebral artery was patent with antegrade flow.  Antimicrobials:   Flagyl 3/21 -->  Cefepime 3/21 -->  Subjective: Pt reports pain in the right foot 4/10 in severity.   Objective: Vitals:   04/01/16 0010 04/01/16 0359 04/01/16 0700 04/01/16 0716  BP: (!) 134/49 (!) 142/43  (!) 111/41  Pulse: 85 93  91  Resp: (!) 22 10  (!) 21  Temp: 98.2 F (36.8 C) 98.3 F (36.8 C) 98 F (36.7 C)   TempSrc: Oral Oral Oral   SpO2: 96% 100%  100%  Weight:      Height:        Intake/Output Summary (Last 24 hours) at 04/01/16 1041 Last data filed at 04/01/16 0500  Gross per 24 hour  Intake          1471.67 ml  Output              125 ml  Net          1346.67 ml   Filed Weights   03/30/16 1715 03/30/16 2237 03/31/16 1645  Weight: 83.2 kg (183 lb 6.8 oz) 83.4 kg (183 lb 13.8 oz) 84.9 kg (187 lb 2.7 oz)    Examination:  General exam: Appears calm,  NAD Respiratory system: Clear to auscultation. Respiratory effort normal. Cardiovascular system: S1 & S2 heard, RRR. No  rubs, gallops or clicks.  Gastrointestinal system: Abdomen is nondistended, soft and nontender. No organomegaly or masses felt. Central nervous system: Alert and oriented. No focal neurological deficits. Extremities: edema +1, right foot with toe wound and s/p procedure, also notable chronic venous changes, right sided PC and left upper arm AVF- good thrill and bruit  Data Reviewed: I have personally reviewed following labs and imaging studies  CBC:  Recent Labs Lab 03/29/16 1928 03/30/16 1140 03/31/16 0515 04/01/16 0218  WBC 12.2* 8.5 8.9 10.5  NEUTROABS 9.8*  --   --   --   HGB 10.0* 8.7* 9.7* 8.4*  HCT 30.6* 27.3* 30.7* 26.6*  MCV 83.6 83.7 83.4 84.4  PLT 317 282 285 188   Basic Metabolic Panel:  Recent Labs Lab 03/29/16 1928 03/30/16 1140 03/31/16 0515 04/01/16 0218  NA 126* 130* 131* 132*  K 3.6 3.5 3.2* 4.0  CL 90* 95* 93* 95*  CO2 22 22 24 23   GLUCOSE 333* 197* 200* 254*  BUN 32* 42* 27* 40*  CREATININE 8.89* 10.19*  10.24* 6.80* 8.88*  CALCIUM 8.6* 8.4* 8.4* 8.0*  PHOS  --  7.0*  --  7.6*   Liver Function Tests:  Recent Labs Lab 03/29/16 1928 03/30/16 1140 04/01/16 0218  AST 20  --   --   ALT 11*  --   --   ALKPHOS 51  --   --   BILITOT 0.8  --   --   PROT 7.1  --   --   ALBUMIN 2.7* 2.3* 2.6*   HbA1C:  Recent Labs  03/30/16 0510  HGBA1C 11.3*   CBG:  Recent Labs Lab 03/31/16 0553 03/31/16 1216 03/31/16 1811 03/31/16 2208 04/01/16 0843  GLUCAP 188* 155* 307* 228* 213*   Urine analysis:    Component Value Date/Time   COLORURINE yellow 09/28/2009 1317   APPEARANCEUR Clear 09/28/2009 1317   LABSPEC 1.010 09/28/2009 1317   PHURINE 5.0 09/28/2009 1317   HGBUR 2+ 09/28/2009 1317   BILIRUBINUR negative 03/17/2016 1648   PROTEINUR trace 03/17/2016 1648   UROBILINOGEN 0.2 03/17/2016 1648   UROBILINOGEN 0.2  09/28/2009 1317   NITRITE negative 03/17/2016 1648   NITRITE negative 09/28/2009 1317   LEUKOCYTESUR small (1+) (A) 03/17/2016 1648   Recent Results (from the past 240 hour(s))  Wound or Superficial Culture     Status: None (Preliminary result)   Collection Time: 03/30/16  3:52 AM  Result Value Ref Range Status   Specimen Description WOUND  Final   Special Requests Immunocompromised RIGHT FOOT  Final   Gram Stain NO WBC SEEN NO ORGANISMS SEEN   Final   Culture PENDING  Incomplete   Report Status PENDING  Incomplete  Surgical pcr screen     Status: Abnormal   Collection Time: 03/30/16  7:03 PM  Result Value Ref Range Status   MRSA, PCR POSITIVE (A) NEGATIVE Final    Comment: RESULT CALLED TO, READ BACK BY AND VERIFIED WITH: Trinda Pascal RN 2106 03/30/16 A BROWNING    Staphylococcus aureus POSITIVE (A) NEGATIVE Final      Radiology Studies: Dg Ang/ext/uni/or Right  Result Date: 03/31/2016 CLINICAL DATA:  56 year old male. Intraoperative angiogram of right lower extremity EXAM: RIGHT ANG/EXT/UNI/ OR CONTRAST:  Op note FLUOROSCOPY TIME:  Op note COMPARISON:  None FINDINGS: Limited intraoperative images of angiogram of right lower extremity. Single image demonstrates patent distal bypass appearing to be of vein bypass, with opacification of the distal superficial femoral artery and popliteal artery. Segments of the femoral popliteal system are obstructed by the arthroplasty hardware. The anastomosis is not visualized. Visualized tibial vessels are patent. IMPRESSION: Limited intraoperative right lower extremity angiogram as a completion study for bypass, as above. Please refer to the dictated operative report for full details of intraoperative findings and procedure. Signed, Dulcy Fanny. Earleen Newport, DO [facility] Radiology Electronically Signed   By: Corrie Mckusick D.O.   On: 03/31/2016 12:51    Scheduled Meds: . aspirin EC  81 mg Oral Daily  .  atorvastatin  80 mg Oral QHS  . carvedilol  3.125 mg Oral BID WC  . ceFEPime (MAXIPIME) IV  1 g Intravenous Q24H  . Chlorhexidine Gluconate Cloth  6 each Topical Q0600  . darbepoetin (ARANESP) injection - DIALYSIS  100 mcg Intravenous Q Fri-HD  . docusate sodium  100 mg Oral Daily  . doxercalciferol  2.5 mcg Intravenous Q M,W,F-HD  . enoxaparin (LOVENOX) injection  30 mg Subcutaneous Q24H  . feeding supplement (NEPRO CARB STEADY)  237 mL Oral BID BM  . fenofibrate  160 mg Oral Daily  . gabapentin  300 mg Oral TID  . insulin aspart  0-9 Units Subcutaneous TID WC  . insulin glargine  40 Units Subcutaneous Q2200  . lanthanum  1,000 mg Oral TID WC  . loratadine  10 mg Oral Daily  . metroNIDAZOLE  500 mg Oral Q8H  . mupirocin ointment  1 application Nasal BID  . pantoprazole  40 mg Oral Daily  . polyethylene glycol  17 g Oral BID  . sodium chloride flush  3 mL Intravenous Q12H   Continuous Infusions: . sodium chloride 50 mL/hr at 03/31/16 1900     LOS: 2 days   Time spent: 20 minutes   Faye Ramsay, MD [facility] Pager [phone]  If 7PM-7AM, please contact night-coverage [email or web address] Password [facility] 04/01/2016, 10:41 AM

## 2016-04-01 NOTE — Anesthesia Postprocedure Evaluation (Signed)
Anesthesia Post Note  Patient: SYD NEWSOME  Procedure(s) Performed: Procedure(s) (LRB): BYPASS GRAFT FEMORAL-POPLITEAL ARTERY USING RIGHT GREATER SAPHENOUS NONREVERSED VEIN (Right) IRRIGATION AND DEBRIDEMENT FOOT (Right) RIGHT GREATER SAPHENOUS VEIN HARVEST (Right) INTRA OPERATIVE ARTERIOGRAM (Right)  Patient location during evaluation: PACU Anesthesia Type: General Level of consciousness: awake and alert Pain management: pain level controlled Vital Signs Assessment: post-procedure vital signs reviewed and stable Respiratory status: spontaneous breathing, nonlabored ventilation, respiratory function stable and patient connected to nasal cannula oxygen Cardiovascular status: blood pressure returned to baseline and stable Postop Assessment: no signs of nausea or vomiting Anesthetic complications: no       Last Vitals:  Vitals:   04/01/16 0700 04/01/16 0716  BP:  (!) 111/41  Pulse:  91  Resp:  (!) 21  Temp: 36.7 C     Last Pain:  Vitals:   04/01/16 0831  TempSrc:   PainSc: Asleep                 Catalina Gravel

## 2016-04-01 NOTE — Progress Notes (Signed)
VASCULAR SURGERY ASSESSMENT & PLAN:   1 Day Post-Op s/p: Right Fem BK Pop bypass with vein.  Graft is patent with good doppler signals right foot.   Minimal JP drainage. D/C JP's and remove ace bandages in AM (04/02/16)  PTx: PWB right foot, heel only with Darko shoe.  Transfer to 2W  Hydrotherapy to right foot and dsg changes BID (Hydrogel, moist 2X2 (NS). Kerlix, 4" ace)  ID: On IV Maxipime. Gram stain from OR. No organisms seen. Culture pending.   On Lovenox for DVT prophylaxis.    SUBJECTIVE:   Pain adequately controlled.   PHYSICAL EXAM:   Vitals:   04/01/16 0010 04/01/16 0359 04/01/16 0700 04/01/16 0716  BP: (!) 134/49 (!) 142/43  (!) 111/41  Pulse: 85 93  91  Resp: (!) 22 10  (!) 21  Temp: 98.2 F (36.8 C) 98.3 F (36.8 C) 98 F (36.7 C)   TempSrc: Oral Oral Oral   SpO2: 96% 100%  100%  Weight:      Height:       Good doppler flow right ATA and PT Wounds inspected. Look marginal, but will need some time to declare itself. JP's with minimal drainage.   LABS:   Lab Results  Component Value Date   WBC 10.5 04/01/2016   HGB 8.4 (L) 04/01/2016   HCT 26.6 (L) 04/01/2016   MCV 84.4 04/01/2016   PLT 264 04/01/2016   Lab Results  Component Value Date   CREATININE 8.88 (H) 04/01/2016   Lab Results  Component Value Date   INR 0.89 12/28/2015   CBG (last 3)   Recent Labs  03/31/16 1811 03/31/16 2208 04/01/16 0843  GLUCAP 307* 228* 213*    PROBLEM LIST:    Principal Problem:   Diabetic foot ulcer ([place]) Active Problems:   Poorly controlled type II diabetes mellitus with renal complication (HCC)   Hyperlipidemia   Essential hypertension   Anemia   Diabetic peripheral neuropathy (HCC)   COPD (chronic obstructive pulmonary disease) (HCC)   Chronic systolic congestive heart failure (HCC)   CAD (coronary artery disease)   NICM (nonischemic cardiomyopathy) ([place])   Peripheral vascular disease ([place])   ESRD (end stage renal disease) on dialysis  ([place])   Bilateral carotid bruits   Diabetes mellitus with complication (HCC)   Anemia due to chronic kidney disease   CURRENT MEDS:   . aspirin EC  81 mg Oral Daily  . atorvastatin  80 mg Oral QHS  . carvedilol  3.125 mg Oral BID WC  . ceFEPime (MAXIPIME) IV  1 g Intravenous Q24H  . Chlorhexidine Gluconate Cloth  6 each Topical Q0600  . darbepoetin (ARANESP) injection - DIALYSIS  100 mcg Intravenous Q Fri-HD  . docusate sodium  100 mg Oral Daily  . doxercalciferol  2.5 mcg Intravenous Q M,W,F-HD  . enoxaparin (LOVENOX) injection  30 mg Subcutaneous Q24H  . feeding supplement (NEPRO CARB STEADY)  237 mL Oral BID BM  . fenofibrate  160 mg Oral Daily  . gabapentin  300 mg Oral TID  . insulin aspart  0-9 Units Subcutaneous TID WC  . insulin glargine  40 Units Subcutaneous Q2200  . lanthanum  1,000 mg Oral TID WC  . loratadine  10 mg Oral Daily  . metroNIDAZOLE  500 mg Oral Q8H  . mupirocin ointment  1 application Nasal BID  . pantoprazole  40 mg Oral Daily  . polyethylene glycol  17 g Oral BID  . sodium chloride flush  3 mL Intravenous Q12H    Gae Gallop Beeper: [phone] Office: [phone] 04/01/2016

## 2016-04-01 NOTE — Progress Notes (Signed)
PT Hydrotherapy  Order received for Hydrotherapy.  Patient getting ready to go to HD.  Will initiate hydro 3/24 in am.  Thank you. 04/01/2016 [place], [place]

## 2016-04-01 NOTE — Progress Notes (Signed)
04/01/2016 1500 Received pt to room 2W19 from 4E.  Pt is A&O.  Some c/o pain does not requests pain med at this time.  Tele monitor applied and CCMD notified.  Oriented to room, call light and bed.  Call bell in reach, family at bedside. [place]

## 2016-04-01 NOTE — Progress Notes (Signed)
S: No significant issues overnight- in pain- just got some meds   O:BP (!) 111/41   Pulse 91   Temp 98 F (36.7 C) (Oral)   Resp (!) 21   Ht 5\' 10"  (1.778 m)   Wt 84.9 kg (187 lb 2.7 oz)   SpO2 100%   BMI 26.86 kg/m   Intake/Output Summary (Last 24 hours) at 04/01/16 1117 Last data filed at 04/01/16 0500  Gross per 24 hour  Intake          1221.67 ml  Output               25 ml  Net          1196.67 ml   Weight change: -0.3 kg (-10.6 oz) UXL:KGMWN and alert CVS: RRR Resp: clear Abd:+ BS NTND Ext: No edema LLE, RLE bandaged.  LUA AVF + bruit NEURO:CNI Ox3   . aspirin EC  81 mg Oral Daily  . atorvastatin  80 mg Oral QHS  . carvedilol  3.125 mg Oral BID WC  . ceFEPime (MAXIPIME) IV  1 g Intravenous Q24H  . Chlorhexidine Gluconate Cloth  6 each Topical Q0600  . darbepoetin (ARANESP) injection - DIALYSIS  100 mcg Intravenous Q Fri-HD  . docusate sodium  100 mg Oral Daily  . doxercalciferol  2.5 mcg Intravenous Q M,W,F-HD  . enoxaparin (LOVENOX) injection  30 mg Subcutaneous Q24H  . feeding supplement (NEPRO CARB STEADY)  237 mL Oral BID BM  . fenofibrate  160 mg Oral Daily  . gabapentin  300 mg Oral TID  . insulin aspart  0-9 Units Subcutaneous TID WC  . insulin glargine  40 Units Subcutaneous Q2200  . lanthanum  1,000 mg Oral TID WC  . loratadine  10 mg Oral Daily  . metroNIDAZOLE  500 mg Oral Q8H  . mupirocin ointment  1 application Nasal BID  . pantoprazole  40 mg Oral Daily  . polyethylene glycol  17 g Oral BID  . sodium chloride flush  3 mL Intravenous Q12H   Dg Ang/ext/uni/or Right  Result Date: 03/31/2016 CLINICAL DATA:  56 year old male. Intraoperative angiogram of right lower extremity EXAM: RIGHT ANG/EXT/UNI/ OR CONTRAST:  Op note FLUOROSCOPY TIME:  Op note COMPARISON:  None FINDINGS: Limited intraoperative images of angiogram of right lower extremity. Single image demonstrates patent distal bypass appearing to be of vein bypass, with opacification of the  distal superficial femoral artery and popliteal artery. Segments of the femoral popliteal system are obstructed by the arthroplasty hardware. The anastomosis is not visualized. Visualized tibial vessels are patent. IMPRESSION: Limited intraoperative right lower extremity angiogram as a completion study for bypass, as above. Please refer to the dictated operative report for full details of intraoperative findings and procedure. Signed, Dulcy Fanny. Earleen Newport, DO [facility] Radiology Electronically Signed   By: Corrie Mckusick D.O.   On: 03/31/2016 12:51   BMET    Component Value Date/Time   NA 132 (L) 04/01/2016 0218   K 4.0 04/01/2016 0218   CL 95 (L) 04/01/2016 0218   CO2 23 04/01/2016 0218   GLUCOSE 254 (H) 04/01/2016 0218   BUN 40 (H) 04/01/2016 0218   CREATININE 8.88 (H) 04/01/2016 0218   CALCIUM 8.0 (L) 04/01/2016 0218   CALCIUM 13.6 (HH) 06/04/2015 1300   GFRNONAA 6 (L) 04/01/2016 0218   GFRAA 7 (L) 04/01/2016 0218   CBC    Component Value Date/Time   WBC 10.5 04/01/2016 0218  RBC 3.15 (L) 04/01/2016 0218   HGB 8.4 (L) 04/01/2016 0218   HCT 26.6 (L) 04/01/2016 0218   PLT 264 04/01/2016 0218   MCV 84.4 04/01/2016 0218   MCH 26.7 04/01/2016 0218   MCHC 31.6 04/01/2016 0218   RDW 14.0 04/01/2016 0218   LYMPHSABS 1.1 03/29/2016 1928   MONOABS 0.9 03/29/2016 1928   EOSABS 0.3 03/29/2016 1928   BASOSABS 0.0 03/29/2016 1928     Dialyzes at [employer]- MWF  4 hours EDW 87- but has been below. HD Bath 2K, 2.5 calc, Dialyzer regular, Heparin 1000 bolus and 500 per hour. Access PC but also left AVF close to ready.  Gets epogen 5200 q treatment and hectorol 2.5 q treatment  Assessment:  1. ESRD HD MWF [place]- plan for treatment today via PC- AVF is close 2. SP Rt Fem-Pop bypass- per [facility] 3. HTN- seems controlled- under EDW-  4. Anemia- on Aranesp- had some acute blood loss with surgery - follow for transfusion need  5. Sec HPTH on  hectorol, fosrenol     Anthony Bullock A

## 2016-04-01 NOTE — Plan of Care (Signed)
Problem: Education: Goal: Knowledge of [facility] Education information/materials will improve Outcome: Progressing Patient educated on need for open communication with medical staff

## 2016-04-01 NOTE — Progress Notes (Addendum)
Report called to 2W RN. Pt scheduled for HD this early afternoon, will transfer pt belongings while in HD. Pt to transfer to new room on 2W from HD.   1450: Pt transferred to 2W19, all belongings sent with patient and spouse. Pt awaiting HD treatment.

## 2016-04-01 NOTE — Progress Notes (Signed)
Pharmacy Antibiotic Note Anthony Bullock is a 56 y.o. male admitted on 03/30/2016 with non healing right foot wound. S/p right common femoral artery to below-knee popliteal artery bypass and debridement of right foot on 3/22. Currently on day 3 of Cefepime and Flagyl for treatment.   Plan: 1. Continue Cefepime 1 gram IV every 24 hours in setting of ESRD 2. Remains on Flagyl 500 mg PO every 8 hours  3. Await pending culture data and deescalate abx as feasible   Height: 5\' 10"  (177.8 cm) Weight: 187 lb 2.7 oz (84.9 kg) IBW/kg (Calculated) : 73  Temp (24hrs), Avg:97.8 F (36.6 C), Min:96.9 F (36.1 C), Max:98.3 F (36.8 C)   Recent Labs Lab 03/29/16 1928 03/30/16 1140 03/31/16 0515 04/01/16 0218  WBC 12.2* 8.5 8.9 10.5  CREATININE 8.89* 10.19*  10.24* 6.80* 8.88*    Estimated Creatinine Clearance: 9.8 mL/min (A) (by C-G formula based on SCr of 8.88 mg/dL (H)).    Allergies  Allergen Reactions  . Morphine Sulfate Rash and Other (See Comments)    REACTION: Itches all over, red spots    Antimicrobials this admission: 3/21 Flagyl >>  3/21 Cefepime >>   Microbiology results: 3/21 BCx x2: ngtd 3/22 R foot wound Cx: pending  3/21 MRSA PCR: positive   Thank you for allowing pharmacy to be a part of this patient's care.  Vincenza Hews, PharmD, BCPS 04/01/2016, 10:19 AM

## 2016-04-02 DIAGNOSIS — L089 Local infection of the skin and subcutaneous tissue, unspecified: Secondary | ICD-10-CM

## 2016-04-02 DIAGNOSIS — I739 Peripheral vascular disease, unspecified: Secondary | ICD-10-CM

## 2016-04-02 DIAGNOSIS — E1121 Type 2 diabetes mellitus with diabetic nephropathy: Secondary | ICD-10-CM

## 2016-04-02 DIAGNOSIS — E1169 Type 2 diabetes mellitus with other specified complication: Secondary | ICD-10-CM

## 2016-04-02 DIAGNOSIS — L97403 Non-pressure chronic ulcer of unspecified heel and midfoot with necrosis of muscle: Secondary | ICD-10-CM

## 2016-04-02 DIAGNOSIS — K5900 Constipation, unspecified: Secondary | ICD-10-CM

## 2016-04-02 DIAGNOSIS — L97501 Non-pressure chronic ulcer of other part of unspecified foot limited to breakdown of skin: Secondary | ICD-10-CM

## 2016-04-02 DIAGNOSIS — E1165 Type 2 diabetes mellitus with hyperglycemia: Secondary | ICD-10-CM

## 2016-04-02 DIAGNOSIS — L97412 Non-pressure chronic ulcer of right heel and midfoot with fat layer exposed: Secondary | ICD-10-CM

## 2016-04-02 DIAGNOSIS — E1129 Type 2 diabetes mellitus with other diabetic kidney complication: Secondary | ICD-10-CM

## 2016-04-02 DIAGNOSIS — N186 End stage renal disease: Secondary | ICD-10-CM

## 2016-04-02 DIAGNOSIS — Z992 Dependence on renal dialysis: Secondary | ICD-10-CM

## 2016-04-02 DIAGNOSIS — E08621 Diabetes mellitus due to underlying condition with foot ulcer: Secondary | ICD-10-CM

## 2016-04-02 DIAGNOSIS — D631 Anemia in chronic kidney disease: Secondary | ICD-10-CM

## 2016-04-02 DIAGNOSIS — A498 Other bacterial infections of unspecified site: Secondary | ICD-10-CM

## 2016-04-02 DIAGNOSIS — E0865 Diabetes mellitus due to underlying condition with hyperglycemia: Secondary | ICD-10-CM

## 2016-04-02 DIAGNOSIS — Z794 Long term (current) use of insulin: Secondary | ICD-10-CM

## 2016-04-02 DIAGNOSIS — L97418 Non-pressure chronic ulcer of right heel and midfoot with other specified severity: Secondary | ICD-10-CM

## 2016-04-02 DIAGNOSIS — N189 Chronic kidney disease, unspecified: Secondary | ICD-10-CM

## 2016-04-02 LAB — RENAL FUNCTION PANEL
Albumin: 2.4 g/dL — ABNORMAL LOW (ref 3.5–5.0)
Anion gap: 12 (ref 5–15)
BUN: 16 mg/dL (ref 6–20)
CO2: 24 mmol/L (ref 22–32)
Calcium: 8.2 mg/dL — ABNORMAL LOW (ref 8.9–10.3)
Chloride: 94 mmol/L — ABNORMAL LOW (ref 101–111)
Creatinine, Ser: 4.67 mg/dL — ABNORMAL HIGH (ref 0.61–1.24)
GFR calc Af Amer: 15 mL/min — ABNORMAL LOW (ref >60)
GFR calc non Af Amer: 13 mL/min — ABNORMAL LOW (ref >60)
Glucose, Bld: 234 mg/dL — ABNORMAL HIGH (ref 65–99)
Phosphorus: 3.6 mg/dL (ref 2.5–4.6)
Potassium: 3 mmol/L — ABNORMAL LOW (ref 3.5–5.1)
Sodium: 130 mmol/L — ABNORMAL LOW (ref 135–145)

## 2016-04-02 LAB — CBC
HCT: 23.1 % — ABNORMAL LOW (ref 39.0–52.0)
Hemoglobin: 7.5 g/dL — ABNORMAL LOW (ref 13.0–17.0)
MCH: 27.2 pg (ref 26.0–34.0)
MCHC: 32.5 g/dL (ref 30.0–36.0)
MCV: 83.7 fL (ref 78.0–100.0)
Platelets: 229 10*3/uL (ref 150–400)
RBC: 2.76 MIL/uL — ABNORMAL LOW (ref 4.22–5.81)
RDW: 14.3 % (ref 11.5–15.5)
WBC: 9.9 10*3/uL (ref 4.0–10.5)

## 2016-04-02 LAB — GLUCOSE, CAPILLARY
Glucose-Capillary: 136 mg/dL — ABNORMAL HIGH (ref 65–99)
Glucose-Capillary: 159 mg/dL — ABNORMAL HIGH (ref 65–99)
Glucose-Capillary: 169 mg/dL — ABNORMAL HIGH (ref 65–99)
Glucose-Capillary: 286 mg/dL — ABNORMAL HIGH (ref 65–99)

## 2016-04-02 MED ORDER — SENNOSIDES-DOCUSATE SODIUM 8.6-50 MG PO TABS
2.0000 | ORAL_TABLET | Freq: Two times a day (BID) | ORAL | Status: DC
  Administered 2016-04-02 – 2016-04-03 (×4): 2 via ORAL
  Filled 2016-04-02 (×5): qty 2

## 2016-04-02 MED ORDER — BISACODYL 5 MG PO TBEC: 10.0000 mg | DELAYED_RELEASE_TABLET | Freq: Every day | ORAL | Status: DC | PRN

## 2016-04-02 MED ORDER — POLYETHYLENE GLYCOL 3350 17 G PO PACK
17.0000 g | PACK | Freq: Every day | ORAL | Status: DC
  Administered 2016-04-02: 17 g via ORAL
  Filled 2016-04-02: qty 1

## 2016-04-02 NOTE — Progress Notes (Signed)
S: No significant issues overnight- in pain- just got some meds.  Had HD late yesterday removed 2 liters tolerated well   O:BP (!) 131/44 (BP Location: Right Arm)   Pulse 100   Temp 99.3 F (37.4 C) (Oral)   Resp 18   Ht 5\' 10"  (1.778 m)   Wt 82.7 kg (182 lb 5.1 oz)   SpO2 91%   BMI 26.16 kg/m   Intake/Output Summary (Last 24 hours) at 04/02/16 0835 Last data filed at 04/02/16 6314  Gross per 24 hour  Intake              240 ml  Output             2005 ml  Net            -1765 ml   Weight change: -0.6 kg (-1 lb 5.2 oz) HFW:YOVZC and alert- pain is better  CVS: RRR Resp: clear Abd:+ BS NTND Ext: No edema LLE, RLE bandaged.  LUA AVF + bruit NEURO:CNI Ox3   . aspirin EC  81 mg Oral Daily  . atorvastatin  80 mg Oral QHS  . carvedilol  3.125 mg Oral BID WC  . ceFEPime (MAXIPIME) IV  1 g Intravenous Q24H  . Chlorhexidine Gluconate Cloth  6 each Topical Q0600  . darbepoetin (ARANESP) injection - DIALYSIS  100 mcg Intravenous Q Fri-HD  . docusate sodium  100 mg Oral Daily  . doxercalciferol  2.5 mcg Intravenous Q M,W,F-HD  . enoxaparin (LOVENOX) injection  30 mg Subcutaneous Q24H  . feeding supplement (NEPRO CARB STEADY)  237 mL Oral BID BM  . fenofibrate  160 mg Oral Daily  . gabapentin  300 mg Oral TID  . insulin aspart  0-9 Units Subcutaneous TID WC  . insulin glargine  20 Units Subcutaneous BID  . lanthanum  1,000 mg Oral TID WC  . loratadine  10 mg Oral Daily  . metroNIDAZOLE  500 mg Oral Q8H  . mupirocin ointment  1 application Nasal BID  . pantoprazole  40 mg Oral Daily  . polyethylene glycol  17 g Oral BID  . sodium chloride flush  3 mL Intravenous Q12H   Dg Ang/ext/uni/or Right  Result Date: 03/31/2016 CLINICAL DATA:  56 year old male. Intraoperative angiogram of right lower extremity EXAM: RIGHT ANG/EXT/UNI/ OR CONTRAST:  Op note FLUOROSCOPY TIME:  Op note COMPARISON:  None FINDINGS: Limited intraoperative images of angiogram of right lower extremity. Single  image demonstrates patent distal bypass appearing to be of vein bypass, with opacification of the distal superficial femoral artery and popliteal artery. Segments of the femoral popliteal system are obstructed by the arthroplasty hardware. The anastomosis is not visualized. Visualized tibial vessels are patent. IMPRESSION: Limited intraoperative right lower extremity angiogram as a completion study for bypass, as above. Please refer to the dictated operative report for full details of intraoperative findings and procedure. Signed, Dulcy Fanny. Earleen Newport, DO [facility] Radiology Electronically Signed   By: Corrie Mckusick D.O.   On: 03/31/2016 12:51   BMET    Component Value Date/Time   NA 130 (L) 04/02/2016 0152   K 3.0 (L) 04/02/2016 0152   CL 94 (L) 04/02/2016 0152   CO2 24 04/02/2016 0152   GLUCOSE 234 (H) 04/02/2016 0152   BUN 16 04/02/2016 0152   CREATININE 4.67 (H) 04/02/2016 0152   CALCIUM 8.2 (L) 04/02/2016 0152   CALCIUM 13.6 ([place]) 06/04/2015 1300   GFRNONAA 13 (L) 04/02/2016 5885  GFRAA 15 (L) 04/02/2016 0152   CBC    Component Value Date/Time   WBC 9.9 04/02/2016 0152   RBC 2.76 (L) 04/02/2016 0152   HGB 7.5 (L) 04/02/2016 0152   HCT 23.1 (L) 04/02/2016 0152   PLT 229 04/02/2016 0152   MCV 83.7 04/02/2016 0152   MCH 27.2 04/02/2016 0152   MCHC 32.5 04/02/2016 0152   RDW 14.3 04/02/2016 0152   LYMPHSABS 1.1 03/29/2016 1928   MONOABS 0.9 03/29/2016 1928   EOSABS 0.3 03/29/2016 1928   BASOSABS 0.0 03/29/2016 1928     Dialyzes at [facility]- MWF  4 hours EDW 87- but has been below. HD Bath 2K, 2.5 calc, Dialyzer regular, Heparin 1000 bolus and 500 per hour. Access PC but also left AVF close to ready.  Gets epogen 5200 q treatment and hectorol 2.5 q treatment  Assessment:  1. ESRD HD MWF [place]- plan for next treatment Monday via PC- AVF is close 2. SP Rt Fem-Pop bypass on 3/22- per [facility]- also on abx for foot wound 3. HTN-  seems controlled- under EDW-  4. Anemia- on Aranesp- had some acute blood loss with surgery - follow for transfusion need  5. Sec HPTH on hectorol, fosrenol - labs are OK 6. Hypokalemia- probably seeing some affect from last nights HD- would not replete- think will equilibrate- will check in AM    Anthony Bullock A

## 2016-04-02 NOTE — Progress Notes (Signed)
Physical Therapy Wound Evaluation Patient Details  Name: Anthony Bullock MRN: [id number] Date of Birth: 1960/10/17  Today's Date: 04/02/2016 Time: 1032-1054 Time Calculation (min): 22 min  Subjective  Patient and Family Stated Goals: healed up and back independent  Pain Score:    Wound Assessment  Wound / Incision (Open or Dehisced) 03/30/16 Incision - Open;Other (Comment) Foot Right infected amputee (Active)  Dressing Type Gauze (Comment);Hydrogel;Moist to dry;Compression wrap 04/02/2016 11:16 AM  Dressing Changed Changed 04/02/2016 11:16 AM  Dressing Status Clean;Dry;Intact 04/02/2016 11:16 AM  Dressing Change Frequency Twice a day 04/02/2016 11:16 AM  Site / Wound Assessment Purple;Pink;Red;Yellow 04/02/2016 11:16 AM  % Wound base Red or Granulating 35% 04/02/2016 11:16 AM  % Wound base Yellow/Fibrinous Exudate 65% 04/02/2016 11:16 AM  % Wound base Black/Eschar 0% 04/02/2016 11:16 AM  % Wound base Other/Granulation Tissue (Comment) 0% 04/02/2016 11:16 AM  Peri-wound Assessment Purple;Pink 04/02/2016 11:16 AM  Wound Length (cm) 3 cm 04/02/2016 11:16 AM  Wound Width (cm) 2 cm 04/02/2016 11:16 AM  Wound Depth (cm) 1.6 cm 04/02/2016 11:16 AM  Margins Other (Comment) 04/02/2016 11:16 AM  Closure Surface sutures;Approximated 04/02/2016 11:16 AM  Drainage Amount Minimal 04/02/2016 11:16 AM  Drainage Description Serous 04/02/2016 11:16 AM  Treatment Cleansed;Debridement (Selective);Hydrotherapy (Pulse lavage);Packing (Saline gauze);Other (Comment) 04/02/2016 11:16 AM     Wound / Incision (Open or Dehisced) 04/02/16 Other (Comment) Foot Right;Lateral R lateral foot at base of 5th metatarsal head. (Active)  Dressing Type Compression wrap;Gauze (Comment);Hydrogel;Moist to dry 04/02/2016 11:16 AM  Dressing Changed Changed 04/02/2016 11:16 AM  Dressing Status Clean;Dry;Intact 04/02/2016 11:16 AM  Dressing Change Frequency Twice a day 04/02/2016 11:16 AM  Site / Wound Assessment Purple;Pale;Red 04/02/2016 11:16 AM   % Wound base Red or Granulating 40% 04/02/2016 11:16 AM  % Wound base Yellow/Fibrinous Exudate 20% 04/02/2016 11:16 AM  % Wound base Black/Eschar 40% 04/02/2016 11:16 AM  Peri-wound Assessment Erythema (blanchable);Purple;Other (Comment) 04/02/2016 11:16 AM  Wound Length (cm) 1.8 cm 04/02/2016 11:16 AM  Wound Width (cm) 2.5 cm 04/02/2016 11:16 AM  Wound Depth (cm) 0.5 cm 04/02/2016 11:16 AM  Margins Unattached edges (unapproximated) 04/02/2016 11:16 AM  Drainage Amount Minimal 04/02/2016 11:16 AM  Drainage Description Serosanguineous 04/02/2016 11:16 AM  Treatment Cleansed;Debridement (Selective);Hydrotherapy (Pulse lavage);Packing (Saline gauze);Other (Comment) 04/02/2016 11:16 AM      Hydrotherapy Pulsed lavage therapy - wound location: 2 wounds R Lateral foot, Pulsed Lavage with Suction (psi): 8 psi Pulsed Lavage with Suction - Normal Saline Used: 1000 mL Pulsed Lavage Tip: Tip with splash shield Selective Debridement Selective Debridement - Location: 2 Lateral wounds R foot Selective Debridement - Tools Used: Forceps;Scalpel Selective Debridement - Tissue Removed: eschar and fibrinous mush in both wounds   Wound Assessment and Plan  Wound Therapy - Assess/Plan/Recommendations Wound Therapy - Clinical Statement: pt's wounds will benefit from PLS to decrease bio burden and soften  for selective debridement.  I'm a little worried about a soft area in the middle of the plantar surface uncer thick callous. Wound Therapy - Functional Problem List: pt unable to ambulate on his foot even with [employer] shoe at this point. Factors Delaying/Impairing Wound Healing: Infection - systemic/local;Diabetes Mellitus;Multiple medical problems;Vascular compromise Hydrotherapy Plan: Debridement;Dressing change;Patient/family education;Pulsatile lavage with suction Wound Therapy - Frequency: 6X / week Wound Therapy - Current Recommendations: Case manager/social work Wound Therapy - Follow Up Recommendations: Home  health RN Wound Plan: see above  Wound Therapy Goals- Improve the function of patient's integumentary system by progressing the wound(s) through  the phases of wound healing (inflammation - proliferation - remodeling) by: Decrease Necrotic Tissue to: 25% both wounds Decrease Necrotic Tissue - Progress: Goal set today Increase Granulation Tissue to: at least 75%  Increase Granulation Tissue - Progress: Goal set today Improve Drainage Characteristics: Min;Serous Improve Drainage Characteristics - Progress: Goal set today Patient/Family will be able to : demo simple wrapping technique for when dressing gets soiled. Patient/Family Instruction Goal - Progress: Goal set today Goals/treatment plan/discharge plan were made with and agreed upon by patient/family: Yes Time For Goal Achievement: 7 days Wound Therapy - Potential for Goals: Good  Goals will be updated until maximal potential achieved or discharge criteria met.  Discharge criteria: when goals achieved, discharge from hospital, MD decision/surgical intervention, no progress towards goals, refusal/missing three consecutive treatments without notification or medical reason.  GP     Eliseo Gum Money Mckeithan 04/02/2016, 11:39 AM 04/02/2016  [place] Bing, PT [phone]  (pager)

## 2016-04-02 NOTE — Progress Notes (Signed)
qPhysical Therapy Treatment Patient Details Name: Anthony Bullock MRN: [id number] DOB: [birth year] Today's Date: 04/02/2016    History of Present Illness Pt is a 56 y/o male s/p R foot wound debridement and R common femoral artery to below knee popliteal artery bypass. PMH including but not limited to ESRD, AICD, CHF, COPD and DM.    PT Comments    Limited to basic transfers and standing in w/bearing in [facility] shoe.  Pt was not willing to ambulate on the foot, R leg was hurting 10/10, far more than the foot.    Follow Up Recommendations  Home health PT;Supervision/Assistance - 24 hour     Equipment Recommendations  None recommended by PT    Recommendations for Other Services       Precautions / Restrictions Precautions Precautions: Fall Restrictions Weight Bearing Restrictions: No    Mobility  Bed Mobility Overal bed mobility: Needs Assistance Bed Mobility: Supine to Sit;Sit to Supine     Supine to sit: Min guard;HOB elevated Sit to supine: Min guard   General bed mobility comments: increased time, tentative  Transfers Overall transfer level: Needs assistance Equipment used: Rolling walker (2 wheeled) Transfers: Sit to/from Stand Sit to Stand: Min guard         General transfer comment: increased time, VC'ing for bilateral hand placement, min guard for safety with rise from elevated bed  Ambulation/Gait             General Gait Details: pt not willing to walk, but did stand for a minute with w/bearing through [facility] shoe.   Pt reporting 10/10 pain that he could not tolerate furither.   Stairs            Wheelchair Mobility    Modified Rankin (Stroke Patients Only)       Balance Overall balance assessment: Needs assistance Sitting-balance support: No upper extremity supported Sitting balance-Leahy Scale: Good     Standing balance support: During functional activity;Bilateral upper extremity supported Standing balance-Leahy Scale:  Poor Standing balance comment: preferring the use of the RW to lessen w/bearing on foot                            Cognition Arousal/Alertness: Awake/alert Behavior During Therapy: WFL for tasks assessed/performed Overall Cognitive Status: Within Functional Limits for tasks assessed                                        Exercises      General Comments        Pertinent Vitals/Pain Pain Assessment: 0-10 Pain Score: 10-Worst pain ever Pain Location: R LE in weightbearing Pain Descriptors / Indicators: Sore;Grimacing Pain Intervention(s): Premedicated before session;Limited activity within patient's tolerance    Home Living                      Prior Function            PT Goals (current goals can now be found in the care plan section) Acute Rehab PT Goals Patient Stated Goal: return home, decrease pain PT Goal Formulation: With patient/family Time For Goal Achievement: 04/15/16 Potential to Achieve Goals: Good Progress towards PT goals: Progressing toward goals    Frequency           PT Plan Current plan remains appropriate    Co-evaluation  End of Session   Activity Tolerance: Patient limited by pain Patient left: in bed;with call bell/phone within reach Nurse Communication: Mobility status PT Visit Diagnosis: Other abnormalities of gait and mobility (R26.89);Pain Pain - Right/Left: Right Pain - part of body: Leg     Time: 4098-1191 PT Time Calculation (min) (ACUTE ONLY): 17 min  Charges:  $Therapeutic Activity: 8-22 mins                    G Codes:       2016/05/01   Bing, PT [phone]  (pager)   Eliseo Gum Brenda Samano 05-01-2016, 11:09 AM 05-01-2016   Bing, PT [phone]  (pager)

## 2016-04-02 NOTE — Progress Notes (Signed)
PROGRESS NOTE                                                                                                                                                                                                             Patient Demographics:    Anthony Bullock, is a 56 y.o. male, DOB - 1960/08/22, FGH:[id number]  Admit date - 03/30/2016   Admitting Physician Ivor Costa, MD  Outpatient Primary MD for the patient is [name], MD  LOS - 3  Outpatient Specialists: renal  Chief Complaint  Patient presents with  . Right Foot Infection       Brief Narrative   56 y.o.malewith medical history significant for peripheral vascular disease with associated diabetic peripheral neuropathy, diabetes, nonischemic cardiac myopathy with chronic systolic heart failure, hypertension, chronic kidney disease on dialysis (MWF), dyslipidemia, COPD, CAD anemia. Patient presented to the ER with concern of wound infection with increased swelling, drainage that began about one week after his toe amputation prior was started on oral Cipro without improvement, patient underwent amputation of right fifth toe on 3/1.    Subjective:      Assessment  & Plan :    Principal Problem:   Diabetic foot ulcer (HCC)With critical living ischemia of the right leg. Patient presented with recent fifth digit amputation wound that appeared infected. On 3/22 Patient underwent: . Right common femoral artery to below-knee popliteal artery bypass with non-reversed translocated saphenous vein graft 2. Intraoperative arteriogram 3. Debridement of right foot wound . Doing better postop. Continue cefepime and Flagyl. Pain control as needed. Vascular surgery following.  Active Problems: Peripheral vascular disease - Management per vascular surgery. Arteriogram done on 3/22. ABI done on 3/23 -thorax are Obtained bilateral ABIs due to noncompressible vessels..   Poorly controlled type II diabetes mellitus with renal complication (HCC) C7E of 11.3. Continue Lantus and sliding scale coverage.  bilateral carotid bruit. Patient asymptomatic. 1- 39% internal carotid artery stenosis bilaterally. Left ECA shows >50% stenosis left vertebral artery shows atypical flow suggesting possible proximal stenosis.  Essential hypertension Continue low-dose Coreg. Stable.   Chronic systolic and diastolic CHF (nonischemic cardiomyopathy) Last echo from August 2017 showed improved EF of 50-50% with grade 1 diastolic dysfunction. Continue low-dose Coreg.  ESRD on dialysis M,W,F Treatment per renal. On hectoral, fosrenol  Anemia of chronic kidney disease. Hemoglobin currently  stable. (Slight drop from baseline without any intervention). Receives aranesp  Coronary artery disease Continue aspirin and beta blocker and statin.  COPD compensated.  Hypokalemia  Intervention needed per renal. Possibly adjust k bath with dialysis.    Code Status : full Code  Family Communication  : none at bedside  Disposition Plan  : home possibly on 3/26 ( needs HHPT)  Barriers For Discharge :Active symptoms  Consults  :   Vascular surgery Renal   Procedures  :  3/22: Ultrasound-guided access to the left common femoral artery Aortogram with bilateral iliac arteriogram  Selective catheterization of the right external iliac artery with right lower extremity runoff Retrograde left femoral arteriogram  Carotid ultrasound ABI    DVT Prophylaxis  :  Lovenox   Lab Results  Component Value Date   PLT 229 04/02/2016    Antibiotics  :    Anti-infectives    Start     Dose/Rate Route Frequency Ordered Stop   03/30/16 2200  ceFEPIme (MAXIPIME) 1 g in dextrose 5 % 50 mL IVPB     1 g 100 mL/hr over 30 Minutes Intravenous Every 24 hours 03/30/16 0533     03/30/16 0600  metroNIDAZOLE (FLAGYL) tablet 500 mg     500 mg Oral Every 8 hours 03/30/16 0351     03/30/16  0400  ceFEPIme (MAXIPIME) 2 g in dextrose 5 % 50 mL IVPB     2 g 100 mL/hr over 30 Minutes Intravenous  Once 03/30/16 0354 03/30/16 0509        Objective:   Vitals:   04/01/16 2300 04/01/16 2308 04/01/16 2350 04/02/16 0612  BP: (!) 130/55 (!) 141/59 (!) 136/47 (!) 131/44  Pulse: (!) 110 (!) 104 (!) 104 100  Resp:  20 18 18   Temp:  97.7 F (36.5 C) 99.2 F (37.3 C) 99.3 F (37.4 C)  TempSrc:  Oral Oral Oral  SpO2:  100% 95% 91%  Weight:  82.3 kg (181 lb 7 oz)  82.7 kg (182 lb 5.1 oz)  Height:        Wt Readings from Last 3 Encounters:  04/02/16 82.7 kg (182 lb 5.1 oz)  03/17/16 89.4 kg (197 lb)  01/14/16 89.8 kg (198 lb)     Intake/Output Summary (Last 24 hours) at 04/02/16 1118 Last data filed at 04/02/16 0800  Gross per 24 hour  Intake              480 ml  Output             2655 ml  Net            -2175 ml     Physical Exam  Gen: not in distress HEENT:  moist mucosa, supple neck Chest: clear b/l, no added sounds CVS: N S1&S2, no murmurs, GI: soft, NT, ND,  Musculoskeletal: warm, no edema, seen with Ace wrap over right foot with drain     Data Review:    CBC  Recent Labs Lab 03/29/16 1928 03/30/16 1140 03/31/16 0515 04/01/16 0218 04/02/16 0152  WBC 12.2* 8.5 8.9 10.5 9.9  HGB 10.0* 8.7* 9.7* 8.4* 7.5*  HCT 30.6* 27.3* 30.7* 26.6* 23.1*  PLT 317 282 285 264 229  MCV 83.6 83.7 83.4 84.4 83.7  MCH 27.3 26.7 26.4 26.7 27.2  MCHC 32.7 31.9 31.6 31.6 32.5  RDW 13.6 13.7 13.5 14.0 14.3  LYMPHSABS 1.1  --   --   --   --   MONOABS 0.9  --   --   --   --  EOSABS 0.3  --   --   --   --   BASOSABS 0.0  --   --   --   --     Chemistries   Recent Labs Lab 03/29/16 1928 03/30/16 1140 03/31/16 0515 04/01/16 0218 04/02/16 0152  NA 126* 130* 131* 132* 130*  K 3.6 3.5 3.2* 4.0 3.0*  CL 90* 95* 93* 95* 94*  CO2 22 22 24 23 24   GLUCOSE 333* 197* 200* 254* 234*  BUN 32* 42* 27* 40* 16  CREATININE 8.89* 10.19*  10.24* 6.80* 8.88* 4.67*  CALCIUM  8.6* 8.4* 8.4* 8.0* 8.2*  AST 20  --   --   --   --   ALT 11*  --   --   --   --   ALKPHOS 51  --   --   --   --   BILITOT 0.8  --   --   --   --    ------------------------------------------------------------------------------------------------------------------ No results for input(s): CHOL, HDL, LDLCALC, TRIG, CHOLHDL, LDLDIRECT in the last 72 hours.  Lab Results  Component Value Date   HGBA1C 11.3 (H) 03/30/2016   ------------------------------------------------------------------------------------------------------------------ No results for input(s): TSH, T4TOTAL, T3FREE, THYROIDAB in the last 72 hours.  Invalid input(s): FREET3 ------------------------------------------------------------------------------------------------------------------ No results for input(s): VITAMINB12, FOLATE, FERRITIN, TIBC, IRON, RETICCTPCT in the last 72 hours.  Coagulation profile No results for input(s): INR, PROTIME in the last 168 hours.  No results for input(s): DDIMER in the last 72 hours.  Cardiac Enzymes No results for input(s): CKMB, TROPONINI, MYOGLOBIN in the last 168 hours.  Invalid input(s): CK ------------------------------------------------------------------------------------------------------------------    Component Value Date/Time   BNP 2,688.5 (H) 11/29/2015 0810    Inpatient Medications  Scheduled Meds: . aspirin EC  81 mg Oral Daily  . atorvastatin  80 mg Oral QHS  . carvedilol  3.125 mg Oral BID WC  . ceFEPime (MAXIPIME) IV  1 g Intravenous Q24H  . Chlorhexidine Gluconate Cloth  6 each Topical Q0600  . darbepoetin (ARANESP) injection - DIALYSIS  100 mcg Intravenous Q Fri-HD  . doxercalciferol  2.5 mcg Intravenous Q M,W,F-HD  . enoxaparin (LOVENOX) injection  30 mg Subcutaneous Q24H  . feeding supplement (NEPRO CARB STEADY)  237 mL Oral BID BM  . fenofibrate  160 mg Oral Daily  . gabapentin  300 mg Oral TID  . insulin aspart  0-9 Units Subcutaneous TID WC  .  insulin glargine  20 Units Subcutaneous BID  . lanthanum  1,000 mg Oral TID WC  . loratadine  10 mg Oral Daily  . metroNIDAZOLE  500 mg Oral Q8H  . mupirocin ointment  1 application Nasal BID  . pantoprazole  40 mg Oral Daily  . polyethylene glycol  17 g Oral Daily  . senna-docusate  2 tablet Oral BID  . sodium chloride flush  3 mL Intravenous Q12H   Continuous Infusions: . sodium chloride 50 mL/hr at 03/31/16 1900   PRN Meds:.sodium chloride, acetaminophen **OR** acetaminophen, azelastine, bisacodyl, guaiFENesin-dextromethorphan, hydrALAZINE, HYDROmorphone (DILAUDID) injection, magnesium sulfate 1 - 4 g bolus IVPB, metoprolol, mupirocin ointment, ondansetron (ZOFRAN) IV, ondansetron **OR** ondansetron (ZOFRAN) IV, oxyCODONE-acetaminophen, phenol  Micro Results Recent Results (from the past 240 hour(s))  Blood Cultures x 2 sites     Status: None (Preliminary result)   Collection Time: 03/30/16  1:44 AM  Result Value Ref Range Status   Specimen Description BLOOD RIGHT ARM  Final   Special Requests IN PEDIATRIC BOTTLE 3ML  Final  Culture NO GROWTH 2 DAYS  Final   Report Status PENDING  Incomplete  Blood Cultures x 2 sites     Status: None (Preliminary result)   Collection Time: 03/30/16  3:50 AM  Result Value Ref Range Status   Specimen Description BLOOD RIGHT FOREARM  Final   Special Requests AEROBIC BOTTLE ONLY 5ML  Final   Culture NO GROWTH 2 DAYS  Final   Report Status PENDING  Incomplete  Wound or Superficial Culture     Status: None   Collection Time: 03/30/16  3:52 AM  Result Value Ref Range Status   Specimen Description WOUND  Final   Special Requests Immunocompromised RIGHT FOOT  Final   Gram Stain NO WBC SEEN NO ORGANISMS SEEN   Final   Culture NO GROWTH 2 DAYS  Final   Report Status 04/01/2016 FINAL  Final  Surgical pcr screen     Status: Abnormal   Collection Time: 03/30/16  7:03 PM  Result Value Ref Range Status   MRSA, PCR POSITIVE (A) NEGATIVE Final     Comment: RESULT CALLED TO, READ BACK BY AND VERIFIED WITH: Trinda Pascal RN [id number] 03/30/16 A BROWNING    Staphylococcus aureus POSITIVE (A) NEGATIVE Final    Comment:        The Xpert SA Assay (FDA approved for NASAL specimens in patients over 32 years of age), is one component of a comprehensive surveillance program.  Test performance has been validated by [facility] for patients greater than or equal to 31 year old. It is not intended to diagnose infection nor to guide or monitor treatment.   Anaerobic culture     Status: None (Preliminary result)   Collection Time: 03/31/16 11:52 AM  Result Value Ref Range Status   Specimen Description WOUND RIGHT FOOT  Final   Special Requests PATIENT ON FOLLOWING  FLAGYL AND MAXIPIME  Final   Culture   Final    NO ANAEROBES ISOLATED; CULTURE IN PROGRESS FOR 5 DAYS   Report Status PENDING  Incomplete  Aerobic Culture (superficial specimen)     Status: None (Preliminary result)   Collection Time: 03/31/16 11:52 AM  Result Value Ref Range Status   Specimen Description WOUND RIGHT FOOT  Final   Special Requests PATIENT ON FOLLOWING  FLAGYL AND MAXIPIME  Final   Gram Stain   Final    RARE WBC PRESENT, PREDOMINANTLY PMN NO ORGANISMS SEEN    Culture RARE GRAM NEGATIVE RODS  Final   Report Status PENDING  Incomplete    Radiology Reports Dg Ang/ext/uni/or Right  Result Date: 03/31/2016 CLINICAL DATA:  56 year old male. Intraoperative angiogram of right lower extremity EXAM: RIGHT ANG/EXT/UNI/ OR CONTRAST:  Op note FLUOROSCOPY TIME:  Op note COMPARISON:  None FINDINGS: Limited intraoperative images of angiogram of right lower extremity. Single image demonstrates patent distal bypass appearing to be of vein bypass, with opacification of the distal superficial femoral artery and popliteal artery. Segments of the femoral popliteal system are obstructed by the arthroplasty hardware. The anastomosis is not visualized. Visualized tibial vessels are patent.  IMPRESSION: Limited intraoperative right lower extremity angiogram as a completion study for bypass, as above. Please refer to the dictated operative report for full details of intraoperative findings and procedure. Signed, Dulcy Fanny. Earleen Newport, DO [facility] Radiology Electronically Signed   By: Corrie Mckusick D.O.   On: 03/31/2016 12:51   Dg Foot Complete Right  Result Date: 03/30/2016 CLINICAL DATA:  Status post amputation of  right fifth toe. Right foot infection. Initial encounter. EXAM: RIGHT FOOT COMPLETE - 3+ VIEW COMPARISON:  Right foot radiographs performed 02/20/2016, and right foot CT performed 02/21/2016 FINDINGS: The patient is status post amputation of the fifth proximal phalanx. Overlying soft tissue swelling is noted. Mild soft tissue air is seen at the site of amputation. Would correlate for any evidence of infection with a gas producing organism. Diffuse vascular calcifications are seen. A small plantar calcaneal spur is seen. IMPRESSION: 1. Status post amputation at the fifth proximal phalanx. Soft tissue swelling noted, with mild soft tissue air. Would correlate for any evidence of infection with a gas producing organism. 2. Diffuse vascular calcifications seen. Electronically Signed   By: Garald Balding M.D.   On: 03/30/2016 02:44    Time Spent in minutes  35   Louellen Molder M.D on 04/02/2016 at 11:18 AM  Between 7am to 7pm - Pager - [phone]  After 7pm go to [email or web address] - password [email or web address]  [facility] -  Office  [phone]

## 2016-04-02 NOTE — Progress Notes (Addendum)
[facility] [place]  Subjective  - Doing well over all.  He is hopeful.    Objective (!) 131/44 100 99.3 F (37.4 C) (Oral) 18 91%  Intake/Output Summary (Last 24 hours) at 04/02/16 1013 Last data filed at 04/02/16 0800  Gross per 24 hour  Intake              480 ml  Output             2655 ml  Net            -2175 ml    Right fem-pop by pass patent  PT/Peroneal signal on the right Incisions soft Drain holes covered with dry dressing Heart RRR Lungs non labored breathing  Assessment/Planning:  1. Right common femoral artery to below-knee popliteal artery bypass with non-reversed translocated saphenous vein graft 2. Intraoperative arteriogram 3. Debridement of right foot wound  Patent bypass Incisions healing well Pulse lavage right foot wound    Theda Sers [facility] MAUREEN 04/02/2016 10:13 AM --  Laboratory Lab Results:  Recent Labs  04/01/16 0218 04/02/16 0152  WBC 10.5 9.9  HGB 8.4* 7.5*  HCT 26.6* 23.1*  PLT 264 229   BMET  Recent Labs  04/01/16 0218 04/02/16 0152  NA 132* 130*  K 4.0 3.0*  CL 95* 94*  CO2 23 24  GLUCOSE 254* 234*  BUN 40* 16  CREATININE 8.88* 4.67*  CALCIUM 8.0* 8.2*    COAG Lab Results  Component Value Date   INR 0.89 12/28/2015   INR 1.03 12/15/2015   INR 1.15 11/29/2015   No results found for: PTT  I have independently interviewed patient and agree with PA assessment and plan above. Doing well from vascular standpoint. Continue PT.   Stephanye Finnicum C. Donzetta Matters, MD [facility] [place] Office: [phone] Pager: [phone]

## 2016-04-03 LAB — RENAL FUNCTION PANEL
Albumin: 2.4 g/dL — ABNORMAL LOW (ref 3.5–5.0)
Anion gap: 14 (ref 5–15)
BUN: 43 mg/dL — ABNORMAL HIGH (ref 6–20)
CO2: 22 mmol/L (ref 22–32)
Calcium: 8.1 mg/dL — ABNORMAL LOW (ref 8.9–10.3)
Chloride: 94 mmol/L — ABNORMAL LOW (ref 101–111)
Creatinine, Ser: 8.21 mg/dL — ABNORMAL HIGH (ref 0.61–1.24)
GFR calc Af Amer: 8 mL/min — ABNORMAL LOW (ref >60)
GFR calc non Af Amer: 7 mL/min — ABNORMAL LOW (ref >60)
Glucose, Bld: 331 mg/dL — ABNORMAL HIGH (ref 65–99)
Phosphorus: 5.8 mg/dL — ABNORMAL HIGH (ref 2.5–4.6)
Potassium: 3.6 mmol/L (ref 3.5–5.1)
Sodium: 130 mmol/L — ABNORMAL LOW (ref 135–145)

## 2016-04-03 LAB — GLUCOSE, CAPILLARY
Glucose-Capillary: 242 mg/dL — ABNORMAL HIGH (ref 65–99)
Glucose-Capillary: 245 mg/dL — ABNORMAL HIGH (ref 65–99)
Glucose-Capillary: 253 mg/dL — ABNORMAL HIGH (ref 65–99)
Glucose-Capillary: 252 mg/dL — ABNORMAL HIGH (ref 65–99)

## 2016-04-03 LAB — CBC
HCT: 24.1 % — ABNORMAL LOW (ref 39.0–52.0)
Hemoglobin: 7.5 g/dL — ABNORMAL LOW (ref 13.0–17.0)
MCH: 26.3 pg (ref 26.0–34.0)
MCHC: 31.1 g/dL (ref 30.0–36.0)
MCV: 84.6 fL (ref 78.0–100.0)
Platelets: 250 10*3/uL (ref 150–400)
RBC: 2.85 MIL/uL — ABNORMAL LOW (ref 4.22–5.81)
RDW: 13.9 % (ref 11.5–15.5)
WBC: 8.5 10*3/uL (ref 4.0–10.5)

## 2016-04-03 MED ORDER — LEVOFLOXACIN IN D5W 750 MG/150ML IV SOLN
750.0000 mg | INTRAVENOUS | Status: DC
  Administered 2016-04-03: 750 mg via INTRAVENOUS
  Filled 2016-04-03: qty 150

## 2016-04-03 MED ORDER — LEVOFLOXACIN IN D5W 500 MG/100ML IV SOLN
500.0000 mg | INTRAVENOUS | Status: DC
  Filled 2016-04-03: qty 100

## 2016-04-03 MED ORDER — POLYETHYLENE GLYCOL 3350 17 G PO PACK
17.0000 g | PACK | Freq: Two times a day (BID) | ORAL | Status: DC
  Administered 2016-04-03: 17 g via ORAL
  Filled 2016-04-03 (×2): qty 1

## 2016-04-03 MED ORDER — BISACODYL 5 MG PO TBEC
10.0000 mg | DELAYED_RELEASE_TABLET | Freq: Every day | ORAL | Status: DC
  Administered 2016-04-03: 10 mg via ORAL
  Filled 2016-04-03 (×2): qty 2

## 2016-04-03 MED ORDER — INSULIN GLARGINE 100 UNIT/ML ~~LOC~~ SOLN
24.0000 [IU] | Freq: Two times a day (BID) | SUBCUTANEOUS | Status: DC
  Administered 2016-04-03 – 2016-04-05 (×3): 24 [IU] via SUBCUTANEOUS
  Filled 2016-04-03 (×5): qty 0.24

## 2016-04-03 NOTE — Progress Notes (Addendum)
[facility] [place] over all.   Objective (!) 152/46 84 98.2 F (36.8 C) (Oral) 16 97% No intake or output data in the 24 hours ending 04/03/16 0821  Incisions clean and dry, healing well without erythema or edema. Right foot amputation site clean with min. Yellow eschar at base Plantar surface appears to have " pocket" of fluid.  Tender to touch.  Right foot warm to touch, active range of motion of toes intact Hear RRR Lung non labored breathing  Assessment/Planning: POD #  3 1. Right common femoral artery to below-knee popliteal artery bypass with non-reversed translocated saphenous vein graft 2. Intraoperative arteriogram 3. Debridement of right foot wound Patent bypass Incisions healing well Pulse lavage right foot wound   3 view foot x ray does not mention plantar area as being a concern will discuss it with Dr. Scot Dock tomorrow.  WBC 8.5,  Anemia 7.5, K+ 3.6on HD M-W-F    Laurence Slate [facility] 04/03/2016 8:21 AM --  Laboratory Lab Results:  Recent Labs  04/02/16 0152 04/03/16 0218  WBC 9.9 8.5  HGB 7.5* 7.5*  HCT 23.1* 24.1*  PLT 229 250   BMET  Recent Labs  04/02/16 0152 04/03/16 0218  NA 130* 130*  K 3.0* 3.6  CL 94* 94*  CO2 24 22  GLUCOSE 234* 331*  BUN 16 43*  CREATININE 4.67* 8.21*  CALCIUM 8.2* 8.1*    COAG Lab Results  Component Value Date   INR 0.89 12/28/2015   INR 1.03 12/15/2015   INR 1.15 11/29/2015   No results found for: PTT  I have independently interviewed patient and agree with PA assessment and plan above. Progressing well from bypass standpoint with incisions in tact, drains out and multiphasic signals at ankle.   Nekoda Chock C. Donzetta Matters, MD [facility] [place] Office: [phone] Pager: [phone]

## 2016-04-03 NOTE — Progress Notes (Signed)
S: No significant issues overnight- is improving slowly- getting hydrotherapy to wound on foot  O:BP (!) 152/46 (BP Location: Right Arm)   Pulse 84   Temp 98.2 F (36.8 C) (Oral)   Resp 16   Ht 5\' 10"  (1.778 m)   Wt 83.4 kg (183 lb 13.8 oz)   SpO2 97%   BMI 26.38 kg/m   Intake/Output Summary (Last 24 hours) at 04/03/16 0758 Last data filed at 04/02/16 0800  Gross per 24 hour  Intake                0 ml  Output              650 ml  Net             -650 ml   Weight change: -0.9 kg (-1 lb 15.7 oz) HDQ:QIWLN and alert- pain is better  CVS: RRR Resp: clear Abd:+ BS NTND Ext: No edema LLE, RLE bandaged.  LUA AVF + bruit NEURO:CNI Ox3   . aspirin EC  81 mg Oral Daily  . atorvastatin  80 mg Oral QHS  . carvedilol  3.125 mg Oral BID WC  . ceFEPime (MAXIPIME) IV  1 g Intravenous Q24H  . Chlorhexidine Gluconate Cloth  6 each Topical Q0600  . darbepoetin (ARANESP) injection - DIALYSIS  100 mcg Intravenous Q Fri-HD  . doxercalciferol  2.5 mcg Intravenous Q M,W,F-HD  . enoxaparin (LOVENOX) injection  30 mg Subcutaneous Q24H  . feeding supplement (NEPRO CARB STEADY)  237 mL Oral BID BM  . fenofibrate  160 mg Oral Daily  . gabapentin  300 mg Oral TID  . insulin aspart  0-9 Units Subcutaneous TID WC  . insulin glargine  24 Units Subcutaneous BID  . lanthanum  1,000 mg Oral TID WC  . loratadine  10 mg Oral Daily  . metroNIDAZOLE  500 mg Oral Q8H  . mupirocin ointment  1 application Nasal BID  . pantoprazole  40 mg Oral Daily  . polyethylene glycol  17 g Oral Daily  . senna-docusate  2 tablet Oral BID  . sodium chloride flush  3 mL Intravenous Q12H   No results found. BMET    Component Value Date/Time   NA 130 (L) 04/03/2016 0218   K 3.6 04/03/2016 0218   CL 94 (L) 04/03/2016 0218   CO2 22 04/03/2016 0218   GLUCOSE 331 (H) 04/03/2016 0218   BUN 43 (H) 04/03/2016 0218   CREATININE 8.21 (H) 04/03/2016 0218   CALCIUM 8.1 (L) 04/03/2016 0218   CALCIUM 13.6 (HH) 06/04/2015 1300    GFRNONAA 7 (L) 04/03/2016 0218   GFRAA 8 (L) 04/03/2016 0218   CBC    Component Value Date/Time   WBC 8.5 04/03/2016 0218   RBC 2.85 (L) 04/03/2016 0218   HGB 7.5 (L) 04/03/2016 0218   HCT 24.1 (L) 04/03/2016 0218   PLT 250 04/03/2016 0218   MCV 84.6 04/03/2016 0218   MCH 26.3 04/03/2016 0218   MCHC 31.1 04/03/2016 0218   RDW 13.9 04/03/2016 0218   LYMPHSABS 1.1 03/29/2016 1928   MONOABS 0.9 03/29/2016 1928   EOSABS 0.3 03/29/2016 1928   BASOSABS 0.0 03/29/2016 1928     Dialyzes at [employer]- MWF  4 hours EDW 87- but has been below. HD Bath 2K, 2.5 calc, Dialyzer regular, Heparin 1000 bolus and 500 per hour. Access PC but also left AVF close to ready.  Gets epogen 5200 q treatment and hectorol 2.5 q treatment  Assessment:  1. ESRD HD MWF [facility]- plan for next treatment Monday via PC- AVF is close- probably prefer to have home unit stick for first time 2. SP Rt Fem-Pop bypass on 3/22- per [facility]- also on abx/hydrotherapy for foot wound 3. HTN- seems controlled- under EDW-  4. Anemia- on Aranesp- had some acute blood loss with surgery - follow for transfusion need - no heparin with HD for now 5. Sec HPTH on hectorol, fosrenol - labs are OK 6. Hypokalemia- checked pretty close to right after HD- better now    Anthony Bullock A

## 2016-04-03 NOTE — Progress Notes (Signed)
PROGRESS NOTE                                                                                                                                                                                                             Patient Demographics:    Anthony Bullock, is a 56 y.o. male, DOB - Jan 02, 1961, WJX:[id number]  Admit date - 03/30/2016   Admitting Physician Ivor Costa, MD  Outpatient Primary MD for the patient is [name], MD  LOS - 4  Outpatient Specialists: renal  Chief Complaint  Patient presents with  . Right Foot Infection       Brief Narrative   56 y.o.malewith medical history significant for peripheral vascular disease with associated diabetic peripheral neuropathy, diabetes, nonischemic cardiac myopathy with chronic systolic heart failure, hypertension, chronic kidney disease on dialysis (MWF), dyslipidemia, COPD, CAD anemia. Patient presented to the ER with concern of wound infection with increased swelling, drainage that began about one week after his toe amputation prior was started on oral Cipro without improvement, patient underwent amputation of right fifth toe on 3/1.    Subjective:    feels better. No overnight issues   Assessment  & Plan :    Principal Problem:   Diabetic foot ulcer (HCC)With critical living ischemia of the right leg. Patient presented with recent fifth digit amputation wound that appeared infected. On 3/22 Patient underwent: . Right common femoral artery to below-knee popliteal artery bypass with non-reversed translocated saphenous vein graft 2. Intraoperative arteriogram 3. Debridement of right foot wound . Doing better postop. Continue cefepime and Flagyl. Pain control as needed. getting hydrotherapy. Vascular surgery following. Noticed "pocket" of fluid over plantar surface area on exam today. Will address.  Active Problems: Peripheral vascular disease - Management  per vascular surgery. Arteriogram done on 3/22. ABI done on 3/23 -thorax are Obtained bilateral ABIs due to noncompressible vessels..    Poorly controlled type II diabetes mellitus with renal complication (HCC) O1H of 11.3. cbg in 250s, increased lantus to 24 u bid with SSI.  bilateral carotid bruit. Patient asymptomatic. 1- 39% internal carotid artery stenosis bilaterally. Left ECA shows >50% stenosis left vertebral artery shows atypical flow suggesting possible proximal stenosis.  Essential hypertension Continue low-dose Coreg. Stable.   Chronic systolic and diastolic CHF (nonischemic cardiomyopathy) Last echo from August 2017 showed improved EF of 50-50%  with grade 1 diastolic dysfunction. Continue low-dose Coreg.  ESRD on dialysis M,W,F Treatment per renal. On hectoral, fosrenol  Anemia of chronic kidney disease. Hemoglobin currently stable. (Slight drop from baseline without any intervention). Receives aranesp  Coronary artery disease Continue aspirin, beta blocker and statin.  COPD compensated.  Hypokalemia Resolved without intervention.  Constipation  not resolved with bid senna-colace, daily miralax ad prn dulcolax suppository. increased milarax to bid and schedule dulcolax.  D/c telemetry.   Code Status : full Code  Family Communication  : wife  at bedside  Disposition Plan  : home possibly on 3/26 ( needs HHPT)  Barriers For Discharge :improving symptoms  Consults  :   Vascular surgery Renal   Procedures  :  3/22: Ultrasound-guided access to the left common femoral artery Aortogram with bilateral iliac arteriogram  Selective catheterization of the right external iliac artery with right lower extremity runoff Retrograde left femoral arteriogram  Carotid ultrasound ABI    DVT Prophylaxis  :  Lovenox   Lab Results  Component Value Date   PLT 250 04/03/2016    Antibiotics  :    Anti-infectives    Start     Dose/Rate Route Frequency Ordered  Stop   03/30/16 2200  ceFEPIme (MAXIPIME) 1 g in dextrose 5 % 50 mL IVPB     1 g 100 mL/hr over 30 Minutes Intravenous Every 24 hours 03/30/16 0533     03/30/16 0600  metroNIDAZOLE (FLAGYL) tablet 500 mg     500 mg Oral Every 8 hours 03/30/16 0351     03/30/16 0400  ceFEPIme (MAXIPIME) 2 g in dextrose 5 % 50 mL IVPB     2 g 100 mL/hr over 30 Minutes Intravenous  Once 03/30/16 0354 03/30/16 0509        Objective:   Vitals:   04/02/16 0612 04/02/16 1132 04/02/16 2003 04/03/16 0501  BP: (!) 131/44 95/71 (!) 132/48 (!) 152/46  Pulse: 100 98 87 84  Resp: 18  16 16   Temp: 99.3 F (37.4 C) 98.9 F (37.2 C) 98.4 F (36.9 C) 98.2 F (36.8 C)  TempSrc: Oral Oral Oral Oral  SpO2: 91% 99% 96% 97%  Weight: 82.7 kg (182 lb 5.1 oz)   83.4 kg (183 lb 13.8 oz)  Height:        Wt Readings from Last 3 Encounters:  04/03/16 83.4 kg (183 lb 13.8 oz)  03/17/16 89.4 kg (197 lb)  01/14/16 89.8 kg (198 lb)    No intake or output data in the 24 hours ending 04/03/16 2725   Physical Exam  Gen: not in distress HEENT:  moist mucosa, supple neck Chest: clear b/l, no added sounds CVS: N S1&S2, no murmurs, GI: soft, NT, ND,  Musculoskeletal: warm, no edema, seen with Ace wrap over right foot with drain     Data Review:    CBC  Recent Labs Lab 03/29/16 1928 03/30/16 1140 03/31/16 0515 04/01/16 0218 04/02/16 0152 04/03/16 0218  WBC 12.2* 8.5 8.9 10.5 9.9 8.5  HGB 10.0* 8.7* 9.7* 8.4* 7.5* 7.5*  HCT 30.6* 27.3* 30.7* 26.6* 23.1* 24.1*  PLT 317 282 285 264 229 250  MCV 83.6 83.7 83.4 84.4 83.7 84.6  MCH 27.3 26.7 26.4 26.7 27.2 26.3  MCHC 32.7 31.9 31.6 31.6 32.5 31.1  RDW 13.6 13.7 13.5 14.0 14.3 13.9  LYMPHSABS 1.1  --   --   --   --   --   MONOABS 0.9  --   --   --   --   --  EOSABS 0.3  --   --   --   --   --   BASOSABS 0.0  --   --   --   --   --     Chemistries   Recent Labs Lab 03/29/16 1928 03/30/16 1140 03/31/16 0515 04/01/16 0218 04/02/16 0152  04/03/16 0218  NA 126* 130* 131* 132* 130* 130*  K 3.6 3.5 3.2* 4.0 3.0* 3.6  CL 90* 95* 93* 95* 94* 94*  CO2 22 22 24 23 24 22   GLUCOSE 333* 197* 200* 254* 234* 331*  BUN 32* 42* 27* 40* 16 43*  CREATININE 8.89* 10.19*  10.24* 6.80* 8.88* 4.67* 8.21*  CALCIUM 8.6* 8.4* 8.4* 8.0* 8.2* 8.1*  AST 20  --   --   --   --   --   ALT 11*  --   --   --   --   --   ALKPHOS 51  --   --   --   --   --   BILITOT 0.8  --   --   --   --   --    ------------------------------------------------------------------------------------------------------------------ No results for input(s): CHOL, HDL, LDLCALC, TRIG, CHOLHDL, LDLDIRECT in the last 72 hours.  Lab Results  Component Value Date   HGBA1C 11.3 (H) 03/30/2016   ------------------------------------------------------------------------------------------------------------------ No results for input(s): TSH, T4TOTAL, T3FREE, THYROIDAB in the last 72 hours.  Invalid input(s): FREET3 ------------------------------------------------------------------------------------------------------------------ No results for input(s): VITAMINB12, FOLATE, FERRITIN, TIBC, IRON, RETICCTPCT in the last 72 hours.  Coagulation profile No results for input(s): INR, PROTIME in the last 168 hours.  No results for input(s): DDIMER in the last 72 hours.  Cardiac Enzymes No results for input(s): CKMB, TROPONINI, MYOGLOBIN in the last 168 hours.  Invalid input(s): CK ------------------------------------------------------------------------------------------------------------------    Component Value Date/Time   BNP 2,688.5 (H) 11/29/2015 0810    Inpatient Medications  Scheduled Meds: . aspirin EC  81 mg Oral Daily  . atorvastatin  80 mg Oral QHS  . bisacodyl  10 mg Oral Daily  . carvedilol  3.125 mg Oral BID WC  . ceFEPime (MAXIPIME) IV  1 g Intravenous Q24H  . Chlorhexidine Gluconate Cloth  6 each Topical Q0600  . darbepoetin (ARANESP) injection - DIALYSIS   100 mcg Intravenous Q Fri-HD  . doxercalciferol  2.5 mcg Intravenous Q M,W,F-HD  . enoxaparin (LOVENOX) injection  30 mg Subcutaneous Q24H  . feeding supplement (NEPRO CARB STEADY)  237 mL Oral BID BM  . fenofibrate  160 mg Oral Daily  . gabapentin  300 mg Oral TID  . insulin aspart  0-9 Units Subcutaneous TID WC  . insulin glargine  24 Units Subcutaneous BID  . lanthanum  1,000 mg Oral TID WC  . loratadine  10 mg Oral Daily  . metroNIDAZOLE  500 mg Oral Q8H  . mupirocin ointment  1 application Nasal BID  . pantoprazole  40 mg Oral Daily  . polyethylene glycol  17 g Oral BID  . senna-docusate  2 tablet Oral BID  . sodium chloride flush  3 mL Intravenous Q12H   Continuous Infusions: . sodium chloride 50 mL/hr at 03/31/16 1900   PRN Meds:.sodium chloride, acetaminophen **OR** acetaminophen, azelastine, guaiFENesin-dextromethorphan, hydrALAZINE, HYDROmorphone (DILAUDID) injection, magnesium sulfate 1 - 4 g bolus IVPB, metoprolol, mupirocin ointment, ondansetron (ZOFRAN) IV, ondansetron **OR** ondansetron (ZOFRAN) IV, oxyCODONE-acetaminophen, phenol  Micro Results Recent Results (from the past 240 hour(s))  Blood Cultures x 2 sites     Status: None (  Preliminary result)   Collection Time: 03/30/16  1:44 AM  Result Value Ref Range Status   Specimen Description BLOOD RIGHT ARM  Final   Special Requests IN PEDIATRIC BOTTLE 3ML  Final   Culture NO GROWTH 3 DAYS  Final   Report Status PENDING  Incomplete  Blood Cultures x 2 sites     Status: None (Preliminary result)   Collection Time: 03/30/16  3:50 AM  Result Value Ref Range Status   Specimen Description BLOOD RIGHT FOREARM  Final   Special Requests AEROBIC BOTTLE ONLY 5ML  Final   Culture NO GROWTH 3 DAYS  Final   Report Status PENDING  Incomplete  Wound or Superficial Culture     Status: None   Collection Time: 03/30/16  3:52 AM  Result Value Ref Range Status   Specimen Description WOUND  Final   Special Requests Immunocompromised  RIGHT FOOT  Final   Gram Stain NO WBC SEEN NO ORGANISMS SEEN   Final   Culture NO GROWTH 2 DAYS  Final   Report Status 04/01/2016 FINAL  Final  Surgical pcr screen     Status: Abnormal   Collection Time: 03/30/16  7:03 PM  Result Value Ref Range Status   MRSA, PCR POSITIVE (A) NEGATIVE Final    Comment: RESULT CALLED TO, READ BACK BY AND VERIFIED WITH: Trinda Pascal RN [id number] 03/30/16 A BROWNING    Staphylococcus aureus POSITIVE (A) NEGATIVE Final    Comment:        The Xpert SA Assay (FDA approved for NASAL specimens in patients over 4 years of age), is one component of a comprehensive surveillance program.  Test performance has been validated by [facility] for patients greater than or equal to 84 year old. It is not intended to diagnose infection nor to guide or monitor treatment.   Anaerobic culture     Status: None (Preliminary result)   Collection Time: 03/31/16 11:52 AM  Result Value Ref Range Status   Specimen Description WOUND RIGHT FOOT  Final   Special Requests PATIENT ON FOLLOWING  FLAGYL AND MAXIPIME  Final   Culture   Final    NO ANAEROBES ISOLATED; CULTURE IN PROGRESS FOR 5 DAYS   Report Status PENDING  Incomplete  Aerobic Culture (superficial specimen)     Status: None (Preliminary result)   Collection Time: 03/31/16 11:52 AM  Result Value Ref Range Status   Specimen Description WOUND RIGHT FOOT  Final   Special Requests PATIENT ON FOLLOWING  FLAGYL AND MAXIPIME  Final   Gram Stain   Final    RARE WBC PRESENT, PREDOMINANTLY PMN NO ORGANISMS SEEN    Culture   Final    RARE STENOTROPHOMONAS MALTOPHILIA RARE ENTEROBACTER SPECIES SUSCEPTIBILITIES TO FOLLOW    Report Status PENDING  Incomplete    Radiology Reports Dg Ang/ext/uni/or Right  Result Date: 03/31/2016 CLINICAL DATA:  56 year old male. Intraoperative angiogram of right lower extremity EXAM: RIGHT ANG/EXT/UNI/ OR CONTRAST:  Op note FLUOROSCOPY TIME:  Op note COMPARISON:  None FINDINGS: Limited  intraoperative images of angiogram of right lower extremity. Single image demonstrates patent distal bypass appearing to be of vein bypass, with opacification of the distal superficial femoral artery and popliteal artery. Segments of the femoral popliteal system are obstructed by the arthroplasty hardware. The anastomosis is not visualized. Visualized tibial vessels are patent. IMPRESSION: Limited intraoperative right lower extremity angiogram as a completion study for bypass, as above. Please refer to the dictated operative report for full details of  intraoperative findings and procedure. Signed, Dulcy Fanny. Earleen Newport, DO [facility] Electronically Signed   By: Corrie Mckusick D.O.   On: 03/31/2016 12:51   Dg Foot Complete Right  Result Date: 03/30/2016 CLINICAL DATA:  Status post amputation of right fifth toe. Right foot infection. Initial encounter. EXAM: RIGHT FOOT COMPLETE - 3+ VIEW COMPARISON:  Right foot radiographs performed 02/20/2016, and right foot CT performed 02/21/2016 FINDINGS: The patient is status post amputation of the fifth proximal phalanx. Overlying soft tissue swelling is noted. Mild soft tissue air is seen at the site of amputation. Would correlate for any evidence of infection with a gas producing organism. Diffuse vascular calcifications are seen. A small plantar calcaneal spur is seen. IMPRESSION: 1. Status post amputation at the fifth proximal phalanx. Soft tissue swelling noted, with mild soft tissue air. Would correlate for any evidence of infection with a gas producing organism. 2. Diffuse vascular calcifications seen. Electronically Signed   By: Garald Balding M.D.   On: 03/30/2016 02:44    Time Spent in minutes  35   Louellen Molder M.D on 04/03/2016 at 9:23 AM  Between 7am to 7pm - Pager - [phone]  After 7pm go to [email or web address] - password [facility]  [facility] -  Office  [phone]

## 2016-04-04 DIAGNOSIS — L089 Local infection of the skin and subcutaneous tissue, unspecified: Secondary | ICD-10-CM | POA: Diagnosis present

## 2016-04-04 DIAGNOSIS — E11628 Type 2 diabetes mellitus with other skin complications: Secondary | ICD-10-CM | POA: Diagnosis present

## 2016-04-04 LAB — CBC
HCT: 22.1 % — ABNORMAL LOW (ref 39.0–52.0)
Hemoglobin: 7.1 g/dL — ABNORMAL LOW (ref 13.0–17.0)
MCH: 26.7 pg (ref 26.0–34.0)
MCHC: 32.1 g/dL (ref 30.0–36.0)
MCV: 83.1 fL (ref 78.0–100.0)
Platelets: 283 10*3/uL (ref 150–400)
RBC: 2.66 MIL/uL — ABNORMAL LOW (ref 4.22–5.81)
RDW: 13.9 % (ref 11.5–15.5)
WBC: 8.3 10*3/uL (ref 4.0–10.5)

## 2016-04-04 LAB — AEROBIC CULTURE  (SUPERFICIAL SPECIMEN)

## 2016-04-04 LAB — GLUCOSE, CAPILLARY
Glucose-Capillary: 190 mg/dL — ABNORMAL HIGH (ref 65–99)
Glucose-Capillary: 142 mg/dL — ABNORMAL HIGH (ref 65–99)
Glucose-Capillary: 126 mg/dL — ABNORMAL HIGH (ref 65–99)
Glucose-Capillary: 126 mg/dL — ABNORMAL HIGH (ref 65–99)
Glucose-Capillary: 157 mg/dL — ABNORMAL HIGH (ref 65–99)

## 2016-04-04 LAB — RENAL FUNCTION PANEL
Albumin: 2.3 g/dL — ABNORMAL LOW (ref 3.5–5.0)
Anion gap: 18 — ABNORMAL HIGH (ref 5–15)
BUN: 72 mg/dL — ABNORMAL HIGH (ref 6–20)
CO2: 22 mmol/L (ref 22–32)
Calcium: 8.4 mg/dL — ABNORMAL LOW (ref 8.9–10.3)
Chloride: 91 mmol/L — ABNORMAL LOW (ref 101–111)
Creatinine, Ser: 11.32 mg/dL — ABNORMAL HIGH (ref 0.61–1.24)
GFR calc Af Amer: 5 mL/min — ABNORMAL LOW (ref >60)
GFR calc non Af Amer: 4 mL/min — ABNORMAL LOW (ref >60)
Glucose, Bld: 226 mg/dL — ABNORMAL HIGH (ref 65–99)
Phosphorus: 7.6 mg/dL — ABNORMAL HIGH (ref 2.5–4.6)
Potassium: 3.8 mmol/L (ref 3.5–5.1)
Sodium: 131 mmol/L — ABNORMAL LOW (ref 135–145)

## 2016-04-04 LAB — CULTURE, BLOOD (ROUTINE X 2)
Culture: NO GROWTH
Culture: NO GROWTH

## 2016-04-04 LAB — AEROBIC CULTURE W GRAM STAIN (SUPERFICIAL SPECIMEN)

## 2016-04-04 LAB — ABO/RH: ABO/RH(D): B NEG

## 2016-04-04 MED ORDER — DARBEPOETIN ALFA 100 MCG/0.5ML IJ SOSY
PREFILLED_SYRINGE | INTRAMUSCULAR | Status: AC
  Administered 2016-04-04: 100 ug via INTRAVENOUS
  Filled 2016-04-04: qty 0.5

## 2016-04-04 MED ORDER — DOXERCALCIFEROL 4 MCG/2ML IV SOLN
INTRAVENOUS | Status: AC
  Administered 2016-04-04: 2.5 ug via INTRAVENOUS
  Filled 2016-04-04: qty 2

## 2016-04-04 MED ORDER — ONDANSETRON HCL 4 MG/2ML IJ SOLN
INTRAMUSCULAR | Status: AC
  Filled 2016-04-04: qty 2

## 2016-04-04 MED ORDER — SODIUM CHLORIDE 0.9 % IV SOLN: Freq: Once | INTRAVENOUS | Status: DC

## 2016-04-04 MED ORDER — DARBEPOETIN ALFA 100 MCG/0.5ML IJ SOSY
100.0000 ug | PREFILLED_SYRINGE | INTRAMUSCULAR | Status: DC
  Administered 2016-04-04: 100 ug via INTRAVENOUS
  Filled 2016-04-04: qty 0.5

## 2016-04-04 NOTE — Progress Notes (Signed)
[facility] Rounding Note  Subjective:  For HD today (usual schedule) Dr. Donzetta Matters had concern about plantar surface of R foot which is warm and tender - Dr. Scot Dock did not mention Still getting hydrotherapy/pulse lavage to foot Had some buttock pain - found to have bilateral pressure ulcers  O:BP (!) 138/42 (BP Location: Right Wrist)   Pulse 75   Temp 97.7 F (36.5 C) (Oral)   Resp 18   Ht 5\' 10"  (1.778 m)   Wt 85.2 kg (187 lb 13.3 oz)   SpO2 98%   BMI 26.95 kg/m   Intake/Output Summary (Last 24 hours) at 04/04/16 1427 Last data filed at 04/03/16 1700  Gross per 24 hour  Intake              240 ml  Output              550 ml  Net             -310 ml   Weight change: 1.8 kg (3 lb 15.5 oz)  Exam Awake, alert, NAD SQ L ICD Lungs clear Normal heart sounds  No edema LLE, RLE bandaged.   LUA BC AVF + bruit (01/14/16) NEURO:CNI Ox3  Medications . aspirin EC  81 mg Oral Daily  . atorvastatin  80 mg Oral QHS  . bisacodyl  10 mg Oral Daily  . carvedilol  3.125 mg Oral BID WC  . darbepoetin (ARANESP) injection - DIALYSIS  100 mcg Intravenous Q Mon-HD  . doxercalciferol  2.5 mcg Intravenous Q M,W,F-HD  . enoxaparin (LOVENOX) injection  30 mg Subcutaneous Q24H  . feeding supplement (NEPRO CARB STEADY)  237 mL Oral BID BM  . fenofibrate  160 mg Oral Daily  . gabapentin  300 mg Oral TID  . insulin aspart  0-9 Units Subcutaneous TID WC  . insulin glargine  24 Units Subcutaneous BID  . lanthanum  1,000 mg Oral TID WC  . [START ON 04/05/2016] levofloxacin (LEVAQUIN) IV  500 mg Intravenous Q48H  . levofloxacin (LEVAQUIN) IV  750 mg Intravenous Q24H  . loratadine  10 mg Oral Daily  . mupirocin ointment  1 application Nasal BID  . pantoprazole  40 mg Oral Daily  . polyethylene glycol  17 g Oral BID  . senna-docusate  2 tablet Oral BID  . sodium chloride flush  3 mL Intravenous Q12H     Recent Labs  04/02/16 0152 04/03/16 0218  NA 130* 130*  K 3.0* 3.6  CL 94* 94*  CO2 24 22   GLUCOSE 234* 331*  BUN 16 43*  CREATININE 4.67* 8.21*  CALCIUM 8.2* 8.1*  PHOS 3.6 5.8*    Recent Labs  04/02/16 0152 04/03/16 0218  ALBUMIN 2.4* 2.4*    Recent Labs  04/02/16 0152 04/03/16 0218  WBC 9.9 8.5  HGB 7.5* 7.5*  HCT 23.1* 24.1*  MCV 83.7 84.6  PLT 229 250    Dialyzes at [facility] MWF   4 hours  EDW 87- will have lower EDW at discharge HD Bath 2K, 2.5 calc, Dialyzer regular,  Heparin 1000 bolus and 500 per hour.  Access PC but also left AVF close to ready.   Gets epogen 5200 q treatment and hectorol 2.5 q treatment   Assessment/Recommendations  1. ESRD HD MWF [facility]- plan for next treatment today via PC- AVF is close- probably prefer to have home unit stick for first time (1st part of April). 3K bath pending labs today.  2. S/P Rt  Fem-Pop bypass on 3/22- per [facility]- also on abx/hydrotherapy for foot wound. Palpable pop pulse, partial WB only, culture from OR rare stenotrophomonas, rare enterobacter. On levaquin IV 3. HTN- seems controlled- under EDW (85.2 floor weight today, EDW 87). New lower EDW at discharge 4. Anemia- on Aranesp 100 QMonday. Some acute blood loss with surgery - follow for transfusion need - no heparin with HD for now.  5. Sec HPTH on hectorol, fosrenol 6. Hypokalemia- checked pretty close to right after HD- better now  Jamal Maes, MD [place] Pager 04/04/2016, 2:32 PM

## 2016-04-04 NOTE — Progress Notes (Signed)
PROGRESS NOTE                                                                                                                                                                                                             Patient Demographics:    Anthony Bullock, is a 56 y.o. male, DOB - 1960/09/28, OFH:[id number]  Admit date - 03/30/2016   Admitting Physician Ivor Costa, MD  Outpatient Primary MD for the patient is [name], MD  LOS - 5  Outpatient Specialists: renal  Chief Complaint  Patient presents with  . Right Foot Infection       Brief Narrative   56 y.o.malewith medical history significant for peripheral vascular disease with associated diabetic peripheral neuropathy, diabetes, nonischemic cardiac myopathy with chronic systolic heart failure, hypertension, chronic kidney disease on dialysis (MWF), dyslipidemia, COPD, CAD anemia. Patient presented to the ER with concern of wound infection with increased swelling, drainage that began about one week after his toe amputation prior was started on oral Cipro without improvement, patient underwent amputation of right fifth toe on 3/1.    Subjective:   No overnight issues. Having difficulty to ambulate   Assessment  & Plan :    Principal Problem:   Diabetic foot ulcer (HCC)With critical living ischemia of the right leg. Patient presented with recent fifth digit amputation wound that appeared infected. On 3/22 Patient underwent: . Right common femoral artery to below-knee popliteal artery bypass with non-reversed translocated saphenous vein graft 2. Intraoperative arteriogram 3. Debridement of right foot wound . Doing better postop. Continue cefepime and Flagyl. Pain control as needed. getting hydrotherapy. Vascular surgery following. Recommends he is stable to d/c once able to ambulate with "Darco" shoes.  Active Problems: Peripheral vascular disease -  Management per vascular surgery. Arteriogram done on 3/22. ABI done on 3/23 -thorax are Obtained bilateral ABIs due to noncompressible vessels..    Poorly controlled type II diabetes mellitus with renal complication (HCC) P4D of 11.3. cbg still in 250s, will switch to lantus 45 units at bedtime and add premeal coverage.  bilateral carotid bruit. Patient asymptomatic. 1- 39% internal carotid artery stenosis bilaterally. Left ECA shows >50% stenosis left vertebral artery shows atypical flow suggesting possible proximal stenosis.  Essential hypertension Continue low-dose Coreg. Stable.   Chronic systolic and diastolic CHF (nonischemic cardiomyopathy) Last echo from August  2017 showed improved EF of 50-50% with grade 1 diastolic dysfunction. Continue low-dose Coreg.  ESRD on dialysis M,W,F Treatment per renal. On hectoral, fosrenol  Anemia of chronic kidney disease. Hemoglobin currently stable. (Slight drop from baseline without any intervention). Receives aranesp  Coronary artery disease Continue aspirin, beta blocker and statin.  COPD compensated.  Hypokalemia Resolved without intervention.  Constipation  not resolved with bid senna-colace, daily miralax ad prn dulcolax suppository. increased milarax to bid and schedule dulcolax.  D/c telemetry.   Code Status : full Code  Family Communication  : none at bedside  Disposition Plan  : home tomorrow if able to ambulate with darco shoes  Barriers For Discharge :improving symptoms  Consults  :   Vascular surgery Renal   Procedures  :  3/22: Ultrasound-guided access to the left common femoral artery Aortogram with bilateral iliac arteriogram  Selective catheterization of the right external iliac artery with right lower extremity runoff Retrograde left femoral arteriogram  Carotid ultrasound ABI    DVT Prophylaxis  :  Lovenox   Lab Results  Component Value Date   PLT 250 04/03/2016    Antibiotics  :     Anti-infectives    Start     Dose/Rate Route Frequency Ordered Stop   04/05/16 1400  levofloxacin (LEVAQUIN) IVPB 500 mg     500 mg 100 mL/hr over 60 Minutes Intravenous Every 48 hours 04/03/16 1317     04/03/16 1400  levofloxacin (LEVAQUIN) IVPB 750 mg     750 mg 100 mL/hr over 90 Minutes Intravenous Every 24 hours 04/03/16 1317     03/30/16 2200  ceFEPIme (MAXIPIME) 1 g in dextrose 5 % 50 mL IVPB  Status:  Discontinued     1 g 100 mL/hr over 30 Minutes Intravenous Every 24 hours 03/30/16 0533 04/03/16 1316   03/30/16 0600  metroNIDAZOLE (FLAGYL) tablet 500 mg  Status:  Discontinued     500 mg Oral Every 8 hours 03/30/16 0351 04/03/16 1316   03/30/16 0400  ceFEPIme (MAXIPIME) 2 g in dextrose 5 % 50 mL IVPB     2 g 100 mL/hr over 30 Minutes Intravenous  Once 03/30/16 0354 03/30/16 0509        Objective:   Vitals:   04/03/16 0501 04/03/16 1358 04/03/16 2020 04/04/16 0255  BP: (!) 152/46 (!) 136/32 (!) 146/54 (!) 137/43  Pulse: 84 80 85 81  Resp: 16 18 18 18   Temp: 98.2 F (36.8 C) 98.2 F (36.8 C) 98.4 F (36.9 C) 98.6 F (37 C)  TempSrc: Oral Oral Oral Oral  SpO2: 97% 100% 100% 99%  Weight: 83.4 kg (183 lb 13.8 oz)   85.2 kg (187 lb 13.3 oz)  Height:        Wt Readings from Last 3 Encounters:  04/04/16 85.2 kg (187 lb 13.3 oz)  03/17/16 89.4 kg (197 lb)  01/14/16 89.8 kg (198 lb)     Intake/Output Summary (Last 24 hours) at 04/04/16 1216 Last data filed at 04/03/16 1700  Gross per 24 hour  Intake              600 ml  Output             1200 ml  Net             -600 ml     Physical Exam  Gen: not in distress HEENT:  moist mucosa, supple neck Chest: clear b/l, no added sounds CVS: N S1&S2, no murmurs,  GI: soft, NT, ND,  Musculoskeletal: warm, no edema, Ace wrap over right foot with drain     Data Review:    CBC  Recent Labs Lab 03/29/16 1928 03/30/16 1140 03/31/16 0515 04/01/16 0218 04/02/16 0152 04/03/16 0218  WBC 12.2* 8.5 8.9 10.5 9.9  8.5  HGB 10.0* 8.7* 9.7* 8.4* 7.5* 7.5*  HCT 30.6* 27.3* 30.7* 26.6* 23.1* 24.1*  PLT 317 282 285 264 229 250  MCV 83.6 83.7 83.4 84.4 83.7 84.6  MCH 27.3 26.7 26.4 26.7 27.2 26.3  MCHC 32.7 31.9 31.6 31.6 32.5 31.1  RDW 13.6 13.7 13.5 14.0 14.3 13.9  LYMPHSABS 1.1  --   --   --   --   --   MONOABS 0.9  --   --   --   --   --   EOSABS 0.3  --   --   --   --   --   BASOSABS 0.0  --   --   --   --   --     Chemistries   Recent Labs Lab 03/29/16 1928 03/30/16 1140 03/31/16 0515 04/01/16 0218 04/02/16 0152 04/03/16 0218  NA 126* 130* 131* 132* 130* 130*  K 3.6 3.5 3.2* 4.0 3.0* 3.6  CL 90* 95* 93* 95* 94* 94*  CO2 22 22 24 23 24 22   GLUCOSE 333* 197* 200* 254* 234* 331*  BUN 32* 42* 27* 40* 16 43*  CREATININE 8.89* 10.19*  10.24* 6.80* 8.88* 4.67* 8.21*  CALCIUM 8.6* 8.4* 8.4* 8.0* 8.2* 8.1*  AST 20  --   --   --   --   --   ALT 11*  --   --   --   --   --   ALKPHOS 51  --   --   --   --   --   BILITOT 0.8  --   --   --   --   --    ------------------------------------------------------------------------------------------------------------------ No results for input(s): CHOL, HDL, LDLCALC, TRIG, CHOLHDL, LDLDIRECT in the last 72 hours.  Lab Results  Component Value Date   HGBA1C 11.3 (H) 03/30/2016   ------------------------------------------------------------------------------------------------------------------ No results for input(s): TSH, T4TOTAL, T3FREE, THYROIDAB in the last 72 hours.  Invalid input(s): FREET3 ------------------------------------------------------------------------------------------------------------------ No results for input(s): VITAMINB12, FOLATE, FERRITIN, TIBC, IRON, RETICCTPCT in the last 72 hours.  Coagulation profile No results for input(s): INR, PROTIME in the last 168 hours.  No results for input(s): DDIMER in the last 72 hours.  Cardiac Enzymes No results for input(s): CKMB, TROPONINI, MYOGLOBIN in the last 168 hours.  Invalid  input(s): CK ------------------------------------------------------------------------------------------------------------------    Component Value Date/Time   BNP 2,688.5 (H) 11/29/2015 0810    Inpatient Medications  Scheduled Meds: . aspirin EC  81 mg Oral Daily  . atorvastatin  80 mg Oral QHS  . bisacodyl  10 mg Oral Daily  . carvedilol  3.125 mg Oral BID WC  . darbepoetin (ARANESP) injection - DIALYSIS  100 mcg Intravenous Q Mon-HD  . doxercalciferol  2.5 mcg Intravenous Q M,W,F-HD  . enoxaparin (LOVENOX) injection  30 mg Subcutaneous Q24H  . feeding supplement (NEPRO CARB STEADY)  237 mL Oral BID BM  . fenofibrate  160 mg Oral Daily  . gabapentin  300 mg Oral TID  . insulin aspart  0-9 Units Subcutaneous TID WC  . insulin glargine  24 Units Subcutaneous BID  . lanthanum  1,000 mg Oral TID WC  . [START ON 04/05/2016] levofloxacin (  LEVAQUIN) IV  500 mg Intravenous Q48H  . levofloxacin (LEVAQUIN) IV  750 mg Intravenous Q24H  . loratadine  10 mg Oral Daily  . mupirocin ointment  1 application Nasal BID  . pantoprazole  40 mg Oral Daily  . polyethylene glycol  17 g Oral BID  . senna-docusate  2 tablet Oral BID  . sodium chloride flush  3 mL Intravenous Q12H   Continuous Infusions: . sodium chloride 50 mL/hr at 03/31/16 1900   PRN Meds:.sodium chloride, acetaminophen **OR** acetaminophen, azelastine, guaiFENesin-dextromethorphan, hydrALAZINE, HYDROmorphone (DILAUDID) injection, magnesium sulfate 1 - 4 g bolus IVPB, metoprolol, mupirocin ointment, ondansetron (ZOFRAN) IV, ondansetron **OR** ondansetron (ZOFRAN) IV, oxyCODONE-acetaminophen, phenol  Micro Results Recent Results (from the past 240 hour(s))  Blood Cultures x 2 sites     Status: None (Preliminary result)   Collection Time: 03/30/16  1:44 AM  Result Value Ref Range Status   Specimen Description BLOOD RIGHT ARM  Final   Special Requests IN PEDIATRIC BOTTLE 3ML  Final   Culture NO GROWTH 4 DAYS  Final   Report Status  PENDING  Incomplete  Blood Cultures x 2 sites     Status: None (Preliminary result)   Collection Time: 03/30/16  3:50 AM  Result Value Ref Range Status   Specimen Description BLOOD RIGHT FOREARM  Final   Special Requests AEROBIC BOTTLE ONLY 5ML  Final   Culture NO GROWTH 4 DAYS  Final   Report Status PENDING  Incomplete  Wound or Superficial Culture     Status: None   Collection Time: 03/30/16  3:52 AM  Result Value Ref Range Status   Specimen Description WOUND  Final   Special Requests Immunocompromised RIGHT FOOT  Final   Gram Stain NO WBC SEEN NO ORGANISMS SEEN   Final   Culture NO GROWTH 2 DAYS  Final   Report Status 04/01/2016 FINAL  Final  Surgical pcr screen     Status: Abnormal   Collection Time: 03/30/16  7:03 PM  Result Value Ref Range Status   MRSA, PCR POSITIVE (A) NEGATIVE Final    Comment: RESULT CALLED TO, READ BACK BY AND VERIFIED WITH: Trinda Pascal RN [id number] 03/30/16 A BROWNING    Staphylococcus aureus POSITIVE (A) NEGATIVE Final    Comment:        The Xpert SA Assay (FDA approved for NASAL specimens in patients over 31 years of age), is one component of a comprehensive surveillance program.  Test performance has been validated by [facility] for patients greater than or equal to 56 year old. It is not intended to diagnose infection nor to guide or monitor treatment.   Anaerobic culture     Status: None (Preliminary result)   Collection Time: 03/31/16 11:52 AM  Result Value Ref Range Status   Specimen Description WOUND RIGHT FOOT  Final   Special Requests PATIENT ON FOLLOWING  FLAGYL AND MAXIPIME  Final   Culture   Final    NO ANAEROBES ISOLATED; CULTURE IN PROGRESS FOR 5 DAYS   Report Status PENDING  Incomplete  Aerobic Culture (superficial specimen)     Status: None (Preliminary result)   Collection Time: 03/31/16 11:52 AM  Result Value Ref Range Status   Specimen Description WOUND RIGHT FOOT  Final   Special Requests PATIENT ON FOLLOWING  FLAGYL AND  MAXIPIME  Final   Gram Stain   Final    RARE WBC PRESENT, PREDOMINANTLY PMN NO ORGANISMS SEEN    Culture   Final  RARE STENOTROPHOMONAS MALTOPHILIA RARE ENTEROBACTER SPECIES SUSCEPTIBILITIES TO FOLLOW    Report Status PENDING  Incomplete   Organism ID, Bacteria STENOTROPHOMONAS MALTOPHILIA  Final      Susceptibility   Stenotrophomonas maltophilia - MIC*    LEVOFLOXACIN 2 SENSITIVE Sensitive     TRIMETH/SULFA 80 RESISTANT Resistant     * RARE STENOTROPHOMONAS MALTOPHILIA    Radiology Reports Dg Ang/ext/uni/or Right  Result Date: 03/31/2016 CLINICAL DATA:  56 year old male. Intraoperative angiogram of right lower extremity EXAM: RIGHT ANG/EXT/UNI/ OR CONTRAST:  Op note FLUOROSCOPY TIME:  Op note COMPARISON:  None FINDINGS: Limited intraoperative images of angiogram of right lower extremity. Single image demonstrates patent distal bypass appearing to be of vein bypass, with opacification of the distal superficial femoral artery and popliteal artery. Segments of the femoral popliteal system are obstructed by the arthroplasty hardware. The anastomosis is not visualized. Visualized tibial vessels are patent. IMPRESSION: Limited intraoperative right lower extremity angiogram as a completion study for bypass, as above. Please refer to the dictated operative report for full details of intraoperative findings and procedure. Signed, Dulcy Fanny. Earleen Newport, DO [facility] Electronically Signed   By: Corrie Mckusick D.O.   On: 03/31/2016 12:51   Dg Foot Complete Right  Result Date: 03/30/2016 CLINICAL DATA:  Status post amputation of right fifth toe. Right foot infection. Initial encounter. EXAM: RIGHT FOOT COMPLETE - 3+ VIEW COMPARISON:  Right foot radiographs performed 02/20/2016, and right foot CT performed 02/21/2016 FINDINGS: The patient is status post amputation of the fifth proximal phalanx. Overlying soft tissue swelling is noted. Mild soft  tissue air is seen at the site of amputation. Would correlate for any evidence of infection with a gas producing organism. Diffuse vascular calcifications are seen. A small plantar calcaneal spur is seen. IMPRESSION: 1. Status post amputation at the fifth proximal phalanx. Soft tissue swelling noted, with mild soft tissue air. Would correlate for any evidence of infection with a gas producing organism. 2. Diffuse vascular calcifications seen. Electronically Signed   By: Garald Balding M.D.   On: 03/30/2016 02:44    Time Spent in minutes  25   Louellen Molder M.D on 04/04/2016 at 12:16 PM  Between 7am to 7pm - Pager - [phone]  After 7pm go to [email or web address] - password [facility]  [facility] -  Office  [phone]

## 2016-04-04 NOTE — Procedures (Signed)
I have personally attended this patient's dialysis session.   3K bath 400/800 Using TDC 400 no issues Getting 1 unit PRBC's for Hb 7.1 On 4K bath (K 3.8) BP stable  Jamal Maes, MD [facility] [phone] Pager 04/04/2016, 5:04 PM

## 2016-04-04 NOTE — Progress Notes (Signed)
Pt complained of back side hurting. Assessed and found bilateral stage 2's on pt's buttocks. Dressed wounds. Came back 30 minutes later and the patient had taken the dressing off. Pt refused to allow RN to redress wounds. Will continue to follow.

## 2016-04-04 NOTE — Progress Notes (Signed)
OT Cancellation Note    04/04/16 1600  OT Visit Information  Last OT Received On 04/04/16  Reason Eval/Treat Not Completed Patient at procedure or test/ unavailable (at HD)  [facility], OT/L  [phone] 04/04/2016

## 2016-04-04 NOTE — Progress Notes (Signed)
PT Cancellation Note  Patient Details Name: Anthony Bullock MRN: [id number] DOB: [birth year]   Cancelled Treatment:    Reason Eval/Treat Not Completed: Patient at procedure or test/unavailable. Pt was undergoing hydro therapy in the AM and then PT returned in PM and pt off floor at HD. PT to return as able.   Nailani Full M Sahan Pen 04/04/2016, 4:00 PM  [name], PT, DPT Pager #: [phone] Office #: [phone]

## 2016-04-04 NOTE — Progress Notes (Signed)
Inpatient Diabetes Program Recommendations  AACE/ADA: New Consensus Statement on Inpatient Glycemic Control (2015)  Target Ranges:  Prepandial:   less than 140 mg/dL      Peak postprandial:   less than 180 mg/dL (1-2 hours)      Critically ill patients:  140 - 180 mg/dL   Lab Results  Component Value Date   GLUCAP 142 (H) 04/04/2016   HGBA1C 11.3 (H) 03/30/2016    Review of Glycemic Control Results for Anthony Bullock, POLGAR (MRN [id number]) as of 04/04/2016 09:43  Ref. Range 04/03/2016 11:10 04/03/2016 16:40 04/03/2016 21:17 04/04/2016 06:10 04/04/2016 09:08  Glucose-Capillary Latest Ref Range: 65 - 99 mg/dL 245 (H) 253 (H) 252 (H) 190 (H) 142 (H)   Diabetes history: DM2 Outpatient Diabetes medications: Lantus 40 units QHS, Humalog approx 6 units tidwc Current orders for Inpatient glycemic control: Lantus 24 units bid, Novolog 0-9 units tidwc  Inpatient Diabetes Program Recommendations:  Please consider Novolog meal coverage 3-4 units tid if eats 50%. Will continue to follow.  Thank you, Nani Gasser. Karielle Davidow, RN, MSN, CDE Inpatient Glycemic Control Team Team Pager [phone] (8am-5pm) 04/04/2016 9:45 AM

## 2016-04-04 NOTE — Progress Notes (Signed)
Physical Therapy Wound Treatment Patient Details  Name: Anthony Bullock MRN: [id number] Date of Birth: [birth year]  Today's Date: 04/04/2016 Time: 4098-1191 Time Calculation (min): 27 min  Subjective  Patient and Family Stated Goals: healed up and back independent  Pain Score: Pain Score: 0-No pain  Wound Assessment  Wound / Incision (Open or Dehisced) 03/30/16 Incision - Open;Other (Comment) Foot Right infected amputee (Active)  Dressing Type Gauze (Comment);Hydrogel;Moist to dry;Compression wrap 04/04/2016  6:32 PM  Dressing Changed Changed 04/04/2016  6:32 PM  Dressing Status Clean;Dry;Intact 04/04/2016  6:32 PM  Dressing Change Frequency Twice a day 04/04/2016  6:32 PM  Site / Wound Assessment Purple;Pink;Red;Yellow 04/04/2016  6:32 PM  % Wound base Red or Granulating 35% 04/04/2016  6:32 PM  % Wound base Yellow/Fibrinous Exudate 65% 04/04/2016  6:32 PM  % Wound base Black/Eschar 0% 04/04/2016  6:32 PM  % Wound base Other/Granulation Tissue (Comment) 0% 04/04/2016  6:32 PM  Peri-wound Assessment Purple;Pink 04/04/2016  6:32 PM  Wound Length (cm) 3 cm 04/02/2016 11:16 AM  Wound Width (cm) 2 cm 04/02/2016 11:16 AM  Wound Depth (cm) 1.6 cm 04/02/2016 11:16 AM  Margins Other (Comment) 04/04/2016  6:32 PM  Closure Surface sutures;Approximated 04/04/2016  6:32 PM  Drainage Amount Minimal 04/04/2016  6:32 PM  Drainage Description Serous 04/04/2016  6:32 PM  Treatment Cleansed;Debridement (Selective);Hydrotherapy (Pulse lavage);Packing (Saline gauze);Pressure applied 04/04/2016  6:32 PM     Wound / Incision (Open or Dehisced) 04/02/16 Other (Comment) Foot Right;Lateral R lateral foot at base of 5th metatarsal head. (Active)  Dressing Type Compression wrap;Gauze (Comment);Hydrogel;Moist to dry 04/04/2016  6:32 PM  Dressing Changed Changed 04/04/2016  6:32 PM  Dressing Status Clean;Dry;Intact 04/04/2016  6:32 PM  Dressing Change Frequency Twice a day 04/04/2016  6:32 PM  Site / Wound Assessment Purple;Pale;Red  04/04/2016  6:32 PM  % Wound base Red or Granulating 40% 04/04/2016  6:32 PM  % Wound base Yellow/Fibrinous Exudate 20% 04/04/2016  6:32 PM  % Wound base Black/Eschar 40% 04/04/2016  6:32 PM  Peri-wound Assessment Erythema (blanchable);Purple;Other (Comment) 04/04/2016  6:32 PM  Wound Length (cm) 1.8 cm 04/02/2016 11:16 AM  Wound Width (cm) 2.5 cm 04/02/2016 11:16 AM  Wound Depth (cm) 0.5 cm 04/02/2016 11:16 AM  Margins Unattached edges (unapproximated) 04/04/2016  6:32 PM  Drainage Amount Minimal 04/04/2016  6:32 PM  Drainage Description Serosanguineous 04/04/2016  6:32 PM  Treatment Cleansed;Debridement (Selective);Hydrotherapy (Pulse lavage);Packing (Saline gauze) 04/04/2016  6:32 PM      Hydrotherapy Pulsed lavage therapy - wound location: 2 wounds R Lateral foot, Pulsed Lavage with Suction (psi): 8 psi Pulsed Lavage with Suction - Normal Saline Used: 1000 mL Pulsed Lavage Tip: Tip with splash shield Selective Debridement Selective Debridement - Location: 2 Lateral wounds R foot Selective Debridement - Tools Used: Forceps;Scalpel Selective Debridement - Tissue Removed: eschar and fibrinous mush in both wounds   Wound Assessment and Plan  Wound Therapy - Assess/Plan/Recommendations Wound Therapy - Clinical Statement: pt's wounds will continue to benefit from PLS to decrease bio burden and soften  for selective debridement.  I'm a little worried about a soft area in the middle of the plantar surface uncer thick callous. Wound Therapy - Functional Problem List: pt unable to ambulate on his foot even with DARCO shoe at this point. Factors Delaying/Impairing Wound Healing: Infection - systemic/local;Diabetes Mellitus;Multiple medical problems;Vascular compromise Hydrotherapy Plan: Debridement;Dressing change;Patient/family education;Pulsatile lavage with suction Wound Therapy - Frequency: 6X / week Wound Therapy - Current Recommendations: Case manager/social  work Wound Therapy - Follow Up  Recommendations: Home health RN Wound Plan: see above  Wound Therapy Goals- Improve the function of patient's integumentary system by progressing the wound(s) through the phases of wound healing (inflammation - proliferation - remodeling) by: Decrease Necrotic Tissue to: 25% both wounds Decrease Necrotic Tissue - Progress: Progressing toward goal Increase Granulation Tissue to: at least 75%  Increase Granulation Tissue - Progress: Progressing toward goal Improve Drainage Characteristics: Min;Serous Patient/Family will be able to : demo simple wrapping technique for when dressing gets soiled. Patient/Family Instruction Goal - Progress: Progressing toward goal Goals/treatment plan/discharge plan were made with and agreed upon by patient/family: Yes Time For Goal Achievement: 7 days Wound Therapy - Potential for Goals: Good  Goals will be updated until maximal potential achieved or discharge criteria met.  Discharge criteria: when goals achieved, discharge from hospital, MD decision/surgical intervention, no progress towards goals, refusal/missing three consecutive treatments without notification or medical reason.  GP     Anthony Bullock Anthony Bullock 04/04/2016, 6:36 PM Anthony Bullock, PTA pager [phone]

## 2016-04-04 NOTE — Progress Notes (Signed)
VASCULAR SURGERY ASSESSMENT & PLAN:   4 Days Post-Op S/P Right Fem BK Pop bypass with vein.  Graft is patent with a palpable right popliteal pulse.   PTx: PWB right foot, heel only with Darko shoe. Slow progress  Hydrotherapy to right foot and dsg changes BID (Hydrogel, moist 2X2 (NS). Kerlix, 4" ace). Wound looks better.   ID: On Levaquin. Gram stain from OR. No organisms seen. Culture Rare stenotrophomas maltophilian (SS: Levaquin) , rare enterobacter   On Lovenox for DVT prophylaxis.   SUBJECTIVE:   No complaints except pain in foot when walking  PHYSICAL EXAM:   Vitals:   04/03/16 0501 04/03/16 1358 04/03/16 2020 04/04/16 0255  BP: (!) 152/46 (!) 136/32 (!) 146/54 (!) 137/43  Pulse: 84 80 85 81  Resp: 16 18 18 18   Temp: 98.2 F (36.8 C) 98.2 F (36.8 C) 98.4 F (36.9 C) 98.6 F (37 C)  TempSrc: Oral Oral Oral Oral  SpO2: 97% 100% 100% 99%  Weight: 183 lb 13.8 oz (83.4 kg)   187 lb 13.3 oz (85.2 kg)  Height:       Palpable right popliteal pulse. Incisions look good Right foot wounds better.   LABS:   Lab Results  Component Value Date   WBC 8.5 04/03/2016   HGB 7.5 (L) 04/03/2016   HCT 24.1 (L) 04/03/2016   MCV 84.6 04/03/2016   PLT 250 04/03/2016   Lab Results  Component Value Date   CREATININE 8.21 (H) 04/03/2016   Lab Results  Component Value Date   INR 0.89 12/28/2015   CBG (last 3)   Recent Labs  04/03/16 1640 04/03/16 2117 04/04/16 0610  GLUCAP 253* 252* 190*    PROBLEM LIST:    Principal Problem:   Diabetic foot ulcer ([place]) Active Problems:   Poorly controlled type II diabetes mellitus with renal complication (HCC)   Hyperlipidemia   Essential hypertension   Anemia   Diabetic peripheral neuropathy (HCC)   COPD (chronic obstructive pulmonary disease) (HCC)   Chronic systolic congestive heart failure (HCC)   CAD (coronary artery disease)   NICM (nonischemic cardiomyopathy) ([place])   Peripheral vascular disease ([place])  ESRD (end stage renal disease) on dialysis ([place])   Bilateral carotid bruits   Diabetes mellitus with complication (HCC)   Anemia due to chronic kidney disease   CURRENT MEDS:   . aspirin EC  81 mg Oral Daily  . atorvastatin  80 mg Oral QHS  . bisacodyl  10 mg Oral Daily  . carvedilol  3.125 mg Oral BID WC  . darbepoetin (ARANESP) injection - DIALYSIS  100 mcg Intravenous Q Fri-HD  . doxercalciferol  2.5 mcg Intravenous Q M,W,F-HD  . enoxaparin (LOVENOX) injection  30 mg Subcutaneous Q24H  . feeding supplement (NEPRO CARB STEADY)  237 mL Oral BID BM  . fenofibrate  160 mg Oral Daily  . gabapentin  300 mg Oral TID  . insulin aspart  0-9 Units Subcutaneous TID WC  . insulin glargine  24 Units Subcutaneous BID  . lanthanum  1,000 mg Oral TID WC  . [START ON 04/05/2016] levofloxacin (LEVAQUIN) IV  500 mg Intravenous Q48H  . levofloxacin (LEVAQUIN) IV  750 mg Intravenous Q24H  . loratadine  10 mg Oral Daily  . mupirocin ointment  1 application Nasal BID  . pantoprazole  40 mg Oral Daily  . polyethylene glycol  17 g Oral BID  . senna-docusate  2 tablet Oral BID  . sodium chloride flush  3  mL Intravenous Q12H    Gae Gallop Beeper: [phone] Office: [phone] 04/04/2016

## 2016-04-05 ENCOUNTER — Telehealth: Payer: Self-pay | Admitting: Vascular Surgery

## 2016-04-05 LAB — ANAEROBIC CULTURE

## 2016-04-05 LAB — TYPE AND SCREEN
ABO/RH(D): B NEG
Antibody Screen: NEGATIVE
Unit division: 0

## 2016-04-05 LAB — BPAM RBC
Blood Product Expiration Date: 201804102359
ISSUE DATE / TIME: 201803261636
Unit Type and Rh: 1700

## 2016-04-05 LAB — PREPARE RBC (CROSSMATCH)

## 2016-04-05 LAB — GLUCOSE, CAPILLARY: Glucose-Capillary: 98 mg/dL (ref 65–99)

## 2016-04-05 MED ORDER — INSULIN GLARGINE 100 UNIT/ML SOLOSTAR PEN: 50.0000 [IU] | PEN_INJECTOR | Freq: Every day | SUBCUTANEOUS | 11 refills | Status: DC

## 2016-04-05 MED ORDER — NEPRO/CARBSTEADY PO LIQD: 237.0000 mL | Freq: Two times a day (BID) | ORAL | 0 refills | Status: DC

## 2016-04-05 MED ORDER — HYDROCODONE-ACETAMINOPHEN 5-325 MG PO TABS: 1.0000 | ORAL_TABLET | Freq: Four times a day (QID) | ORAL | 0 refills | Status: DC | PRN

## 2016-04-05 MED ORDER — LEVOFLOXACIN 500 MG PO TABS: 500.0000 mg | ORAL_TABLET | ORAL | 0 refills | Status: AC

## 2016-04-05 MED ORDER — POLYETHYLENE GLYCOL 3350 17 G PO PACK: 17.0000 g | PACK | Freq: Every day | ORAL | 0 refills | Status: DC | PRN

## 2016-04-05 NOTE — Care Management Note (Addendum)
Case Management Note Marvetta Gibbons RN, BSN Unit 2W-Case Manager [phone]  Patient Details  Name: BAYLEN BUCKNER MRN: [id number] Date of Birth: 1960/04/25  Subjective/Objective:  Pt admitted s/p s/p: Right Fem BK Pop bypass with wound debridement to right foot                  Action/Plan: PTA pt lived at home with spouse- used a RW, PT/OT evals ordered- recommendation for HHPT- pt to have hydrotherapy wound care here starting 3/24- CM to continue to follow for d/c needs.   Expected Discharge Date:  04/05/16               Expected Discharge Plan:  [place]  In-House Referral:     Discharge planning Services  CM Consult  Post Acute Care Choice:  Home Health Choice offered to:  Patient  DME Arranged:    DME Agency:     HH Arranged:  RN, PT [place] Agency:  [place]  Status of Service:  Completed, signed off  If discussed at B and E of Stay Meetings, dates discussed:  3/27  Discharge Disposition: home with home health   Additional Comments:  04/05/16- 1020- Espen Bethel RN, CM- pt for d/c home today- orders placed for HHRN/PT spoke with pt at bedside- choice offered for [facility] agency in [facility]- per pt he chose [facility] services- no DME needs noted- referral called to Santiago Glad with [facility] for HHRN/PT- referral accepted.   Dawayne Patricia, RN 04/05/2016, 10:19 AM

## 2016-04-05 NOTE — Discharge Instructions (Signed)
Dressing changes twice a day using Hydrogel followed by a moist 2X2 with normal saline and then a Kerlix and 4" ace.

## 2016-04-05 NOTE — Progress Notes (Signed)
Occupational Therapy Treatment Patient Details Name: Anthony Bullock MRN: [id number] DOB: 05/31/1960 Today's Date: 04/05/2016    History of present illness Pt is a 56 y/o male s/p R foot wound debridement and R common femoral artery to below knee popliteal artery bypass. PMH including but not limited to ESRD, AICD, CHF, COPD and DM.   OT comments  Reviewed energy conservation techniques and walker safety with pt.  He verbalized understanding of all and is eager to discharge home.   Follow Up Recommendations  No OT follow up    Equipment Recommendations  None recommended by OT    Recommendations for Other Services      Precautions / Restrictions Precautions Precautions: Fall Other Brace/Splint: Darco shoe Restrictions Weight Bearing Restrictions: Yes RLE Weight Bearing: Partial weight bearing RLE Partial Weight Bearing Percentage or Pounds: on heel only in DArco shoe       Mobility Bed Mobility Overal bed mobility: Modified Independent                Transfers Overall transfer level: Needs assistance Equipment used: Rolling walker (2 wheeled) Transfers: Sit to/from Stand Sit to Stand: Supervision              Balance     Sitting balance-Leahy Scale: Good       Standing balance-Leahy Scale: Fair                             ADL either performed or assessed with clinical judgement   ADL                                         General ADL Comments: Pt reports he dressed himself this am without assistance.   He reports he sponge bathes at home.  Reviewed energy conservation techniques with him, and he verbalizes reasonable plan for HD days when he is fatigued.  Instructed him to use walker bag on walker, and how to maneuver walker safely during meal prep and IADLs.  He verbalized understanding of all      Vision       Perception     Praxis      Cognition Arousal/Alertness: Awake/alert Behavior During Therapy: WFL for  tasks assessed/performed Overall Cognitive Status: Within Functional Limits for tasks assessed                                          Exercises     Shoulder Instructions       General Comments He is eager for discharge     Pertinent Vitals/ Pain       Pain Assessment: Faces Faces Pain Scale: Hurts a little bit Pain Location: R LE in weightbearing Pain Descriptors / Indicators: Sore;Grimacing Pain Intervention(s): Monitored during session  Home Living                                          Prior Functioning/Environment              Frequency  Min 2X/week        Progress Toward Goals  OT Goals(current goals can now be found in  the care plan section)  Progress towards OT goals: Progressing toward goals     Plan Discharge plan remains appropriate    Co-evaluation                 End of Session Equipment Utilized During Treatment: Rolling walker  OT Visit Diagnosis: Unsteadiness on feet (R26.81);Other abnormalities of gait and mobility (R26.89);Pain Pain - Right/Left: Right Pain - part of body: Leg   Activity Tolerance Patient tolerated treatment well   Patient Left in bed;with call bell/phone within reach   Nurse Communication          Time: 1020-1029 OT Time Calculation (min): 9 min  Charges: OT General Charges $OT Visit: 1 Procedure OT Treatments $Self Care/Home Management : 8-22 mins  [facility], OTR/L [phone]    Lucille Passy M 04/05/2016, 12:49 PM

## 2016-04-05 NOTE — Progress Notes (Signed)
Physical Therapy Wound Treatment Patient Details  Name: Anthony Bullock MRN: [id number] Date of Birth: 15-Oct-1960  Today's Date: 04/05/2016 Time: 8295-6213 Time Calculation (min): 25 min  Subjective  Subjective: "I am ready to go home and take a nap in my recliner." Patient and Family Stated Goals: healed up and back independent  Pain Score:  3/10 pre med with IV meds  Wound Assessment  Wound / Incision (Open or Dehisced) 03/30/16 Incision - Open;Other (Comment) Foot Right infected amputee (Active)  Dressing Type Gauze (Comment);Moist to dry;Compression wrap 04/05/2016 10:30 AM  Dressing Changed Changed 04/05/2016 10:30 AM  Dressing Status Clean;Dry;Intact 04/05/2016 10:30 AM  Dressing Change Frequency Twice a day 04/05/2016 10:30 AM  Site / Wound Assessment Purple;Pink;Red;Yellow 04/05/2016 10:30 AM  % Wound base Red or Granulating 35% 04/05/2016 10:30 AM  % Wound base Yellow/Fibrinous Exudate 65% 04/05/2016 10:30 AM  % Wound base Black/Eschar 0% 04/05/2016 10:30 AM  % Wound base Other/Granulation Tissue (Comment) 0% 04/05/2016 10:30 AM  Peri-wound Assessment Purple;Pink 04/05/2016 10:30 AM  Wound Length (cm) 3 cm 04/02/2016 11:16 AM  Wound Width (cm) 2 cm 04/02/2016 11:16 AM  Wound Depth (cm) 1.6 cm 04/02/2016 11:16 AM  Margins Other (Comment) 04/05/2016 10:30 AM  Closure Surface sutures;Approximated 04/05/2016 10:30 AM  Drainage Amount Minimal 04/05/2016 10:30 AM  Drainage Description Serous 04/05/2016 10:30 AM  Treatment Cleansed;Debridement (Selective);Hydrotherapy (Pulse lavage);Packing (Saline gauze);Pressure applied 04/05/2016 10:30 AM     Wound / Incision (Open or Dehisced) 04/02/16 Other (Comment) Foot Right;Lateral R lateral foot at base of 5th metatarsal head. (Active)  Dressing Type Compression wrap;Gauze (Comment);Hydrogel;Moist to dry 04/05/2016 10:30 AM  Dressing Changed Changed 04/05/2016 10:30 AM  Dressing Status Clean;Dry;Intact 04/05/2016 10:30 AM  Dressing Change Frequency Twice a  day 04/05/2016 10:30 AM  Site / Wound Assessment Purple;Pale;Red 04/05/2016 10:30 AM  % Wound base Red or Granulating 40% 04/05/2016 10:30 AM  % Wound base Yellow/Fibrinous Exudate 20% 04/05/2016 10:30 AM  % Wound base Black/Eschar 40% 04/05/2016 10:30 AM  % Wound base Other/Granulation Tissue (Comment) 0% 04/05/2016  6:25 AM  Peri-wound Assessment Erythema (blanchable);Purple;Other (Comment) 04/05/2016 10:30 AM  Wound Length (cm) 1.8 cm 04/02/2016 11:16 AM  Wound Width (cm) 2.5 cm 04/02/2016 11:16 AM  Wound Depth (cm) 0.5 cm 04/02/2016 11:16 AM  Margins Unattached edges (unapproximated) 04/05/2016 10:30 AM  Closure None 04/05/2016 10:30 AM  Drainage Amount Minimal 04/05/2016 10:30 AM  Drainage Description Serosanguineous 04/05/2016 10:30 AM  Treatment Cleansed;Debridement (Selective);Hydrotherapy (Pulse lavage);Packing (Saline gauze);Pressure applied 04/05/2016 10:30 AM     Incision (Closed) 01/14/16 Arm Left (Active)      Hydrotherapy Pulsed lavage therapy - wound location: 2 wounds R Lateral foot, Pulsed Lavage with Suction (psi): 8 psi Pulsed Lavage with Suction - Normal Saline Used: 1000 mL Pulsed Lavage Tip: Tip with splash shield Selective Debridement Selective Debridement - Location: 2 Lateral wounds R foot Selective Debridement - Tools Used: Forceps;Scalpel Selective Debridement - Tissue Removed: eschar and fibrinous mush in both wounds   Wound Assessment and Plan  Wound Therapy - Assess/Plan/Recommendations Wound Therapy - Clinical Statement: pt's wounds will continue to benefit from PLS to decrease bio burden and soften  for selective debridement.  Remain worried about a soft area in the middle of the plantar surface under thick callous, and on heels.  Pt educated to keep heels elevated when resting.  Able to debride from both wounds with noticeable bleeding from distal wound.  Pt to d/c today and follow up in office.  Wound Therapy - Functional Problem List: pt able ambulate on his foot  with DARCO shoe at this point for minimal gait trials.   Factors Delaying/Impairing Wound Healing: Infection - systemic/local;Diabetes Mellitus;Multiple medical problems;Vascular compromise Hydrotherapy Plan: Debridement;Dressing change;Patient/family education;Pulsatile lavage with suction Wound Therapy - Frequency: 6X / week Wound Therapy - Current Recommendations: Case manager/social work Wound Therapy - Follow Up Recommendations: Home health RN Wound Plan: see above  Wound Therapy Goals- Improve the function of patient's integumentary system by progressing the wound(s) through the phases of wound healing (inflammation - proliferation - remodeling) by: Decrease Necrotic Tissue to: 25% both wounds Decrease Necrotic Tissue - Progress: Progressing toward goal Increase Granulation Tissue to: at least 75%  Increase Granulation Tissue - Progress: Progressing toward goal Improve Drainage Characteristics: Min;Serous Improve Drainage Characteristics - Progress: Progressing toward goal Patient/Family will be able to : demo simple wrapping technique for when dressing gets soiled. Patient/Family Instruction Goal - Progress: Progressing toward goal Goals/treatment plan/discharge plan were made with and agreed upon by patient/family: Yes Time For Goal Achievement: 7 days Wound Therapy - Potential for Goals: Good  Goals will be updated until maximal potential achieved or discharge criteria met.  Discharge criteria: when goals achieved, discharge from hospital, MD decision/surgical intervention, no progress towards goals, refusal/missing three consecutive treatments without notification or medical reason.  GP     Har[facility] Smoker [name] 04/05/2016, 11:01 AM [name], PTA pager [phone]

## 2016-04-05 NOTE — Discharge Summary (Signed)
Physician Discharge Summary  Anthony Bullock IRS:[id number] DOB: March 18, 1960 DOA: 03/30/2016  PCP: [name], MD  Admit date: 03/30/2016 Discharge date: 04/05/2016  Admitted From: Home Disposition:  Home  Recommendations for Outpatient Follow-up:  1. Follow up with PCP in 1-2 weeks 2. Patient will complete a total 7 days of antibiotics on 3/30. 3. Follow-up with vascular surgery (Dr. Doren Custard) in 2 weeks.  Home Health: RN and PT Equipment/Devices:Darco shoes, rolling walker  Discharge Condition: Fair CODE STATUS: Full code Diet recommendation: Heart Healthy / Carb Modified / renal    Discharge Diagnoses:  Principal Problem:   Diabetic foot ulcer ([place])   Active Problems:     Diabetic foot infection ([place])    Amputation of fifth right toe.   ESRD (end stage renal disease) on dialysis ([place])   Chronic systolic congestive heart failure (HCC)   Poorly controlled type II diabetes mellitus with renal complication (HCC)   Hyperlipidemia   Essential hypertension   Diabetic peripheral neuropathy (HCC)   COPD (chronic obstructive pulmonary disease) (HCC)   CAD (coronary artery disease)   NICM (nonischemic cardiomyopathy) (HCC)   Peripheral vascular disease (HCC)   Bilateral carotid bruits   Anemia due to chronic kidney disease   Brief narrative/history of present illness Please refer to admission H&P for details, in brief,56 y.o.malewith medical history significant for peripheral vascular disease with associated diabetic peripheral neuropathy, diabetes, nonischemic cardiac myopathy with chronic systolic heart failure, hypertension, chronic kidney disease on dialysis (MWF), dyslipidemia, COPD, CAD anemia. Patient presented to the ER with concern of wound infection with increased swelling, drainage that began about one week after his toe amputation prior was started on oral Cipro without improvement, patient underwent amputation of right fifth toe on 3/1.   Hospital  course   Principal Problem:   Diabetic foot ulcer (HCC)With critical living ischemia of the right leg. Patient presented with recent fifth digit amputation wound that appeared infected. On 3/22 Patient underwent: . Right common femoral artery to below-knee popliteal artery bypass with non-reversed translocated saphenous vein graft 2. Intraoperative arteriogram 3. Debridement of right foot wound .  Improving postop. Narrowed antibiotics to Levaquin based on sensitivity (wound culture growing stenotrophomonas and Enterobacter species, both sensitive to quinolones. Plan to treat for further 7 day course. Receive hydrotherapy postop. Pain control with when necessary Vicodin. -Appreciate vascular surgery recommendations. Dressing changes BID (Hydrogel, moist 2X2 (NS). Kerlix, 4" ace).  Patient able to ambulate with "Darco" shoes.  Home health RN and PT arranged.  Active Problems: Peripheral vascular disease - Management per vascular surgery. Arteriogram done on 3/22. ABI done on 3/23 -thorax are Obtained bilateral ABIs due to noncompressible vessels..    Poorly controlled type II diabetes mellitus with renal complication (HCC) K0X of 11.3. Increase Lantus dose to 50 units at bedtime and continue sliding scale coverage.  bilateral carotid bruit. Patient asymptomatic.1- 39% internal carotid artery stenosis bilaterally. Left ECA shows >50% stenosis left vertebral artery shows atypical flow suggesting possible proximal stenosis.  Essential hypertension Continue low-dose Coreg. Stable.   Chronic systolic and diastolic CHF (nonischemic cardiomyopathy) Last echo from August 2017 showed improved EF of 50-50% with grade 1 diastolic dysfunction. Continue low-dose Coreg.  ESRD on dialysis M,W,F Treatment per renal. On hectoral, fosrenol  Anemia of chronic kidney disease. Hemoglobin currently stable. (Slight drop from baseline without any intervention). Receives aranesp  Coronary  artery disease Continue aspirin, beta blocker and statin.  COPD compensated.  Hypokalemia Resolved without intervention.  Constipation Prescribe MiraLAX.  Family Communication  : none at bedside  Disposition Plan  : home with home health    Consults  :   Vascular surgery Renal   Procedures  :  3/22: Ultrasound-guided access to the left common femoral artery Aortogram with bilateral iliac arteriogram  Selective catheterization of the right external iliac artery with right lower extremity runoff Retrograde left femoral arteriogram  Carotid ultrasound ABI  Discharge Instructions   Allergies as of 04/05/2016      Reactions   Morphine Sulfate Rash, Other (See Comments)   REACTION: Itches all over, red spots      Medication List    STOP taking these medications   ciprofloxacin 500 MG tablet Commonly known as:  CIPRO     TAKE these medications   aspirin EC 81 MG tablet Take 81 mg by mouth daily.   atorvastatin 80 MG tablet Commonly known as:  LIPITOR TAKE 1 TABLET AT BEDTIME   azelastine 0.1 % nasal spray Commonly known as:  ASTELIN Place 2 sprays into both nostrils 3 (three) times daily as needed for rhinitis. Use in each nostril as directed   BD PEN NEEDLE NANO U/F 32G X 4 MM Misc Generic drug:  Insulin Pen Needle USE ONCE DAILY   carvedilol 12.5 MG tablet Commonly known as:  COREG Take 1 tablet (12.5 mg total) by mouth 2 (two) times daily with a meal.   feeding supplement (NEPRO CARB STEADY) Liqd Take 237 mLs by mouth 2 (two) times daily between meals.   fenofibrate 160 MG tablet TAKE 1 TABLET DAILY   fexofenadine 180 MG tablet Commonly known as:  ALLEGRA Take 180 mg by mouth daily.   furosemide 40 MG tablet Commonly known as:  LASIX Take 120 mg by mouth daily.   gabapentin 300 MG capsule Commonly known as:  NEURONTIN Take 1 capsule (300 mg total) by mouth 3 (three) times daily.   glucose blood test strip Check 1 time  daily. E11.9 One Touch Ultra Blue Test Strips   HYDROcodone-acetaminophen 5-325 MG tablet Commonly known as:  NORCO/VICODIN Take 1 tablet by mouth every 6 (six) hours as needed for moderate pain.   Insulin Glargine 100 UNIT/ML Solostar Pen Commonly known as:  LANTUS SOLOSTAR Inject 50 Units into the skin daily at 10 pm. What changed:  how much to take   insulin lispro 100 UNIT/ML injection Commonly known as:  HUMALOG Inject 0-14 Units into the skin 3 (three) times daily as needed for high blood sugar (per sliding scale).   lanthanum 1000 MG chewable tablet Commonly known as:  FOSRENOL Chew 1,000 mg by mouth 3 (three) times daily with meals. Take one tablet after each meal.   levofloxacin 500 MG tablet Commonly known as:  LEVAQUIN Take 1 tablet (500 mg total) by mouth every other day.   mupirocin ointment 2 % Commonly known as:  BACTROBAN Place 1 application into the nose 2 (two) times daily as needed. What changed:  reasons to take this   PATADAY 0.2 % Soln Generic drug:  Olopatadine HCl Place 1 drop into both eyes daily as needed (for allergies).   polyethylene glycol packet Commonly known as:  MIRALAX / GLYCOLAX Take 17 g by mouth daily as needed.      Follow-up Information    [name], MD. Schedule an appointment as soon as possible for a visit in 1 week(s).   Specialty:  Family Medicine Contact information: [address] [place] [address] [phone]  [name], MD. Schedule an appointment as soon as possible for a visit in 2 week(s).   Specialties:  Vascular Surgery, Cardiology Contact information: [address] [place] [address] [phone]          Allergies  Allergen Reactions  . Morphine Sulfate Rash and Other (See Comments)    REACTION: Itches all over, red spots     Procedures/Studies: Dg Ang/ext/uni/or Right  Result Date: 03/31/2016 CLINICAL DATA:  56 year old male. Intraoperative angiogram of  right lower extremity EXAM: RIGHT ANG/EXT/UNI/ OR CONTRAST:  Op note FLUOROSCOPY TIME:  Op note COMPARISON:  None FINDINGS: Limited intraoperative images of angiogram of right lower extremity. Single image demonstrates patent distal bypass appearing to be of vein bypass, with opacification of the distal superficial femoral artery and popliteal artery. Segments of the femoral popliteal system are obstructed by the arthroplasty hardware. The anastomosis is not visualized. Visualized tibial vessels are patent. IMPRESSION: Limited intraoperative right lower extremity angiogram as a completion study for bypass, as above. Please refer to the dictated operative report for full details of intraoperative findings and procedure. Signed, Dulcy Fanny. Earleen Newport, DO [facility] Radiology Electronically Signed   By: Corrie Mckusick D.O.   On: 03/31/2016 12:51   Dg Foot Complete Right  Result Date: 03/30/2016 CLINICAL DATA:  Status post amputation of right fifth toe. Right foot infection. Initial encounter. EXAM: RIGHT FOOT COMPLETE - 3+ VIEW COMPARISON:  Right foot radiographs performed 02/20/2016, and right foot CT performed 02/21/2016 FINDINGS: The patient is status post amputation of the fifth proximal phalanx. Overlying soft tissue swelling is noted. Mild soft tissue air is seen at the site of amputation. Would correlate for any evidence of infection with a gas producing organism. Diffuse vascular calcifications are seen. A small plantar calcaneal spur is seen. IMPRESSION: 1. Status post amputation at the fifth proximal phalanx. Soft tissue swelling noted, with mild soft tissue air. Would correlate for any evidence of infection with a gas producing organism. 2. Diffuse vascular calcifications seen. Electronically Signed   By: Garald Balding M.D.   On: 03/30/2016 02:44       Subjective: Feels better. Able to ambulate with Darco shoes  Discharge Exam: Vitals:   04/04/16  2237 04/05/16 0621  BP: (!) 119/42 (!) 140/46  Pulse: 88 82  Resp: 18 18  Temp: 99.2 F (37.3 C) 98.3 F (36.8 C)   Vitals:   04/04/16 1843 04/04/16 1935 04/04/16 2237 04/05/16 0621  BP: (!) 118/32 (!) 134/37 (!) 119/42 (!) 140/46  Pulse: 98 88 88 82  Resp: 19  18 18   Temp:   99.2 F (37.3 C) 98.3 F (36.8 C)  TempSrc:   Oral Oral  SpO2: 100%  97% 100%  Weight:    82.4 kg (181 lb 10.5 oz)  Height:         Gen: not in distress HEENT:  moist mucosa, supple neck Chest: clear b/l, no added sounds CVS: N S1&S2, no murmurs, GI: soft, NT, ND,  Musculoskeletal: warm, no edema, Ace wrap over right foot    The results of significant diagnostics from this hospitalization (including imaging, microbiology, ancillary and laboratory) are listed below for reference.     Microbiology: Recent Results (from the past 240 hour(s))  Blood Cultures x 2 sites     Status: None   Collection Time: 03/30/16  1:44 AM  Result Value Ref Range Status   Specimen Description BLOOD RIGHT ARM  Final   Special Requests  IN PEDIATRIC BOTTLE 3ML  Final   Culture NO GROWTH 5 DAYS  Final   Report Status 04/04/2016 FINAL  Final  Blood Cultures x 2 sites     Status: None   Collection Time: 03/30/16  3:50 AM  Result Value Ref Range Status   Specimen Description BLOOD RIGHT FOREARM  Final   Special Requests AEROBIC BOTTLE ONLY 5ML  Final   Culture NO GROWTH 5 DAYS  Final   Report Status 04/04/2016 FINAL  Final  Wound or Superficial Culture     Status: None   Collection Time: 03/30/16  3:52 AM  Result Value Ref Range Status   Specimen Description WOUND  Final   Special Requests Immunocompromised RIGHT FOOT  Final   Gram Stain NO WBC SEEN NO ORGANISMS SEEN   Final   Culture NO GROWTH 2 DAYS  Final   Report Status 04/01/2016 FINAL  Final  Surgical pcr screen     Status: Abnormal   Collection Time: 03/30/16  7:03 PM  Result Value Ref Range Status   MRSA, PCR POSITIVE (A) NEGATIVE Final    Comment:  RESULT CALLED TO, READ BACK BY AND VERIFIED WITH: Trinda Pascal RN 2106 03/30/16 A BROWNING    Staphylococcus aureus POSITIVE (A) NEGATIVE Final    Comment:        The Xpert SA Assay (FDA approved for NASAL specimens in patients over 68 years of age), is one component of a comprehensive surveillance program.  Test performance has been validated by [facility] for patients greater than or equal to 40 year old. It is not intended to diagnose infection nor to guide or monitor treatment.   Anaerobic culture     Status: None (Preliminary result)   Collection Time: 03/31/16 11:52 AM  Result Value Ref Range Status   Specimen Description WOUND RIGHT FOOT  Final   Special Requests PATIENT ON FOLLOWING  FLAGYL AND MAXIPIME  Final   Culture   Final    NO ANAEROBES ISOLATED; CULTURE IN PROGRESS FOR 5 DAYS   Report Status PENDING  Incomplete  Aerobic Culture (superficial specimen)     Status: None   Collection Time: 03/31/16 11:52 AM  Result Value Ref Range Status   Specimen Description WOUND RIGHT FOOT  Final   Special Requests PATIENT ON FOLLOWING  FLAGYL AND MAXIPIME  Final   Gram Stain   Final    RARE WBC PRESENT, PREDOMINANTLY PMN NO ORGANISMS SEEN    Culture   Final    RARE STENOTROPHOMONAS MALTOPHILIA RARE ENTEROBACTER SPECIES    Report Status 04/04/2016 FINAL  Final   Organism ID, Bacteria STENOTROPHOMONAS MALTOPHILIA  Final   Organism ID, Bacteria ENTEROBACTER SPECIES  Final      Susceptibility   Enterobacter species - MIC*    CEFAZOLIN >=64 RESISTANT Resistant     CEFEPIME <=1 SENSITIVE Sensitive     CEFTAZIDIME <=1 SENSITIVE Sensitive     CEFTRIAXONE <=1 SENSITIVE Sensitive     CIPROFLOXACIN <=0.25 SENSITIVE Sensitive     GENTAMICIN <=1 SENSITIVE Sensitive     IMIPENEM <=0.25 SENSITIVE Sensitive     TRIMETH/SULFA <=20 SENSITIVE Sensitive     PIP/TAZO <=4 SENSITIVE Sensitive     * RARE ENTEROBACTER SPECIES   Stenotrophomonas maltophilia - MIC*    LEVOFLOXACIN 2  SENSITIVE Sensitive     TRIMETH/SULFA 80 RESISTANT Resistant     * RARE STENOTROPHOMONAS MALTOPHILIA     Labs: BNP (last 3 results)  Recent Labs  11/29/15 0810  BNP 6,073.7*   Basic Metabolic Panel:  Recent Labs Lab 03/30/16 1140 03/31/16 0515 04/01/16 0218 04/02/16 0152 04/03/16 0218 04/04/16 1443  NA 130* 131* 132* 130* 130* 131*  K 3.5 3.2* 4.0 3.0* 3.6 3.8  CL 95* 93* 95* 94* 94* 91*  CO2 22 24 23 24 22 22   GLUCOSE 197* 200* 254* 234* 331* 226*  BUN 42* 27* 40* 16 43* 72*  CREATININE 10.19*  10.24* 6.80* 8.88* 4.67* 8.21* 11.32*  CALCIUM 8.4* 8.4* 8.0* 8.2* 8.1* 8.4*  PHOS 7.0*  --  7.6* 3.6 5.8* 7.6*   Liver Function Tests:  Recent Labs Lab 03/29/16 1928 03/30/16 1140 04/01/16 0218 04/02/16 0152 04/03/16 0218 04/04/16 1443  AST 20  --   --   --   --   --   ALT 11*  --   --   --   --   --   ALKPHOS 51  --   --   --   --   --   BILITOT 0.8  --   --   --   --   --   PROT 7.1  --   --   --   --   --   ALBUMIN 2.7* 2.3* 2.6* 2.4* 2.4* 2.3*   No results for input(s): LIPASE, AMYLASE in the last 168 hours. No results for input(s): AMMONIA in the last 168 hours. CBC:  Recent Labs Lab 03/29/16 1928  03/31/16 0515 04/01/16 0218 04/02/16 0152 04/03/16 0218 04/04/16 1443  WBC 12.2*  < > 8.9 10.5 9.9 8.5 8.3  NEUTROABS 9.8*  --   --   --   --   --   --   HGB 10.0*  < > 9.7* 8.4* 7.5* 7.5* 7.1*  HCT 30.6*  < > 30.7* 26.6* 23.1* 24.1* 22.1*  MCV 83.6  < > 83.4 84.4 83.7 84.6 83.1  PLT 317  < > 285 264 229 250 283  < > = values in this interval not displayed. Cardiac Enzymes: No results for input(s): CKTOTAL, CKMB, CKMBINDEX, TROPONINI in the last 168 hours. BNP: Invalid input(s): POCBNP CBG:  Recent Labs Lab 04/04/16 0908 04/04/16 1156 04/04/16 1847 04/04/16 2141 04/05/16 0612  GLUCAP 142* 126* 126* 157* 98   D-Dimer No results for input(s): DDIMER in the last 72 hours. Hgb A1c No results for input(s): HGBA1C in the last 72 hours. Lipid  Profile No results for input(s): CHOL, HDL, LDLCALC, TRIG, CHOLHDL, LDLDIRECT in the last 72 hours. Thyroid function studies No results for input(s): TSH, T4TOTAL, T3FREE, THYROIDAB in the last 72 hours.  Invalid input(s): FREET3 Anemia work up No results for input(s): VITAMINB12, FOLATE, FERRITIN, TIBC, IRON, RETICCTPCT in the last 72 hours. Urinalysis    Component Value Date/Time   COLORURINE yellow 09/28/2009 1317   APPEARANCEUR Clear 09/28/2009 1317   LABSPEC 1.010 09/28/2009 1317   PHURINE 5.0 09/28/2009 1317   HGBUR 2+ 09/28/2009 1317   BILIRUBINUR negative 03/17/2016 1648   PROTEINUR trace 03/17/2016 1648   UROBILINOGEN 0.2 03/17/2016 1648   UROBILINOGEN 0.2 09/28/2009 1317   NITRITE negative 03/17/2016 1648   NITRITE negative 09/28/2009 1317   LEUKOCYTESUR small (1+) (A) 03/17/2016 1648   Sepsis Labs Invalid input(s): PROCALCITONIN,  WBC,  LACTICIDVEN Microbiology Recent Results (from the past 240 hour(s))  Blood Cultures x 2 sites     Status: None   Collection Time: 03/30/16  1:44 AM  Result Value Ref Range Status   Specimen Description BLOOD RIGHT ARM  Final   Special Requests IN PEDIATRIC BOTTLE 3ML  Final   Culture NO GROWTH 5 DAYS  Final   Report Status 04/04/2016 FINAL  Final  Blood Cultures x 2 sites     Status: None   Collection Time: 03/30/16  3:50 AM  Result Value Ref Range Status   Specimen Description BLOOD RIGHT FOREARM  Final   Special Requests AEROBIC BOTTLE ONLY 5ML  Final   Culture NO GROWTH 5 DAYS  Final   Report Status 04/04/2016 FINAL  Final  Wound or Superficial Culture     Status: None   Collection Time: 03/30/16  3:52 AM  Result Value Ref Range Status   Specimen Description WOUND  Final   Special Requests Immunocompromised RIGHT FOOT  Final   Gram Stain NO WBC SEEN NO ORGANISMS SEEN   Final   Culture NO GROWTH 2 DAYS  Final   Report Status 04/01/2016 FINAL  Final  Surgical pcr screen     Status: Abnormal   Collection Time: 03/30/16   7:03 PM  Result Value Ref Range Status   MRSA, PCR POSITIVE (A) NEGATIVE Final    Comment: RESULT CALLED TO, READ BACK BY AND VERIFIED WITH: Trinda Pascal RN 2106 03/30/16 A BROWNING    Staphylococcus aureus POSITIVE (A) NEGATIVE Final    Comment:        The Xpert SA Assay (FDA approved for NASAL specimens in patients over 43 years of age), is one component of a comprehensive surveillance program.  Test performance has been validated by [facility] for patients greater than or equal to 31 year old. It is not intended to diagnose infection nor to guide or monitor treatment.   Anaerobic culture     Status: None (Preliminary result)   Collection Time: 03/31/16 11:52 AM  Result Value Ref Range Status   Specimen Description WOUND RIGHT FOOT  Final   Special Requests PATIENT ON FOLLOWING  FLAGYL AND MAXIPIME  Final   Culture   Final    NO ANAEROBES ISOLATED; CULTURE IN PROGRESS FOR 5 DAYS   Report Status PENDING  Incomplete  Aerobic Culture (superficial specimen)     Status: None   Collection Time: 03/31/16 11:52 AM  Result Value Ref Range Status   Specimen Description WOUND RIGHT FOOT  Final   Special Requests PATIENT ON FOLLOWING  FLAGYL AND MAXIPIME  Final   Gram Stain   Final    RARE WBC PRESENT, PREDOMINANTLY PMN NO ORGANISMS SEEN    Culture   Final    RARE STENOTROPHOMONAS MALTOPHILIA RARE ENTEROBACTER SPECIES    Report Status 04/04/2016 FINAL  Final   Organism ID, Bacteria STENOTROPHOMONAS MALTOPHILIA  Final   Organism ID, Bacteria ENTEROBACTER SPECIES  Final      Susceptibility   Enterobacter species - MIC*    CEFAZOLIN >=64 RESISTANT Resistant     CEFEPIME <=1 SENSITIVE Sensitive     CEFTAZIDIME <=1 SENSITIVE Sensitive     CEFTRIAXONE <=1 SENSITIVE Sensitive     CIPROFLOXACIN <=0.25 SENSITIVE Sensitive     GENTAMICIN <=1 SENSITIVE Sensitive     IMIPENEM <=0.25 SENSITIVE Sensitive     TRIMETH/SULFA <=20 SENSITIVE Sensitive     PIP/TAZO <=4 SENSITIVE Sensitive      * RARE ENTEROBACTER SPECIES   Stenotrophomonas maltophilia - MIC*    LEVOFLOXACIN 2 SENSITIVE Sensitive     TRIMETH/SULFA 80 RESISTANT Resistant     * RARE STENOTROPHOMONAS MALTOPHILIA     Time coordinating discharge: Over 30 minutes  SIGNED:   Louellen Molder, MD  [facility] 04/05/2016, 8:39 AM Pager   If 7PM-7AM, please contact night-coverage [email or web address] Password [email or web address]

## 2016-04-05 NOTE — Telephone Encounter (Signed)
-----   Message from Mena Goes, RN sent at 04/05/2016 10:31 AM EDT ----- Regarding: RE: 2 weeks If the 4-18 appt is with [facility] then yes combine  ----- Message ----- From: Reginia Naas Sent: 04/05/2016  10:23 AM To: Mena Goes, RN Subject: RE: 2 weeks                                    Pt already down for 4/18 w/Dialysis Access, there are no appts in two weeks, is it ok to leave for 4/18 and combine appts?    ----- Message ----- From: Mena Goes, RN Sent: 04/05/2016  10:12 AM To: [facility] Rose Admin Pool Subject: 2 weeks                                          ----- Message ----- From: Gabriel Earing, PA-C Sent: 04/05/2016   9:22 AM To: Vvs Charge Pool  s/p right fem pop with vein.  f/u with [facility] in 2 weeks.  Thanks.

## 2016-04-05 NOTE — Progress Notes (Signed)
qPhysical Therapy Treatment Patient Details Name: Anthony Bullock MRN: [id number] DOB: 08-Feb-1960 Today's Date: 04/05/2016    History of Present Illness Pt is a 56 y/o male s/p R foot wound debridement and R common femoral artery to below knee popliteal artery bypass. PMH including but not limited to ESRD, AICD, CHF, COPD and DM.    PT Comments    Pt progressing with gait and mobility with ability to ambulate in hall today. Pt able to don darco shoe on his own with increased time and benefits from shoe on LLE to assist with balance as well. Pt educated for gait and HEp with encouragement to continue acutely with assist. Will continue to follow.    Follow Up Recommendations  Home health PT;Supervision/Assistance - 24 hour     Equipment Recommendations  None recommended by PT    Recommendations for Other Services       Precautions / Restrictions Precautions Precautions: Fall Required Braces or Orthoses: Other Brace/Splint Other Brace/Splint: Darco shoe Restrictions Weight Bearing Restrictions: Yes RLE Weight Bearing: Partial weight bearing RLE Partial Weight Bearing Percentage or Pounds: on heel only in DArco shoe    Mobility  Bed Mobility Overal bed mobility: Modified Independent                Transfers Overall transfer level: Needs assistance   Transfers: Sit to/from Stand Sit to Stand: Min guard         General transfer comment: cues for hand placement and safety  Ambulation/Gait Ambulation/Gait assistance: Min guard Ambulation Distance (Feet): 80 Feet Assistive device: Rolling walker (2 wheeled) Gait Pattern/deviations: Step-to pattern;Decreased stance time - right   Gait velocity interpretation: Below normal speed for age/gender General Gait Details: pt able to maintain weight on Right heel throughout gait   Stairs            Wheelchair Mobility    Modified Rankin (Stroke Patients Only)       Balance Overall balance assessment: Needs  assistance   Sitting balance-Leahy Scale: Good       Standing balance-Leahy Scale: Fair                              Cognition Arousal/Alertness: Awake/alert Behavior During Therapy: WFL for tasks assessed/performed Overall Cognitive Status: Within Functional Limits for tasks assessed                                        Exercises General Exercises - Lower Extremity Long Arc Quad: AROM;Right;Seated;15 reps Hip Flexion/Marching: AROM;Right;Seated;5 reps    General Comments        Pertinent Vitals/Pain Pain Assessment: No/denies pain    Home Living                      Prior Function            PT Goals (current goals can now be found in the care plan section) Progress towards PT goals: Progressing toward goals    Frequency    Min 3X/week      PT Plan Current plan remains appropriate;Frequency needs to be updated    Co-evaluation             End of Session Equipment Utilized During Treatment: Gait belt Activity Tolerance: Patient tolerated treatment well Patient left: in chair;with call bell/phone within reach;with  family/visitor present;with nursing/sitter in room Nurse Communication: Mobility status PT Visit Diagnosis: Difficulty in walking, not elsewhere classified (R26.2)     Time: 1021-1173 PT Time Calculation (min) (ACUTE ONLY): 16 min  Charges:  $Gait Training: 8-22 mins                    G Codes:       Elwyn Reach, PT [phone]    [place] 04/05/2016, 8:03 AM

## 2016-04-05 NOTE — Progress Notes (Signed)
Discharged pt per MD order. Went over AVS with patients spouse. Iv removed, telemetry d/c. Medications went over and prescriptions provided. No questions at this time. Patient to home with spouse.  Cyndia Bent RN

## 2016-04-05 NOTE — Telephone Encounter (Signed)
appts to be combined on 4/18 for PO f/u from 1/4 and 3/26

## 2016-04-05 NOTE — Progress Notes (Signed)
Progress Note    04/05/2016 9:12 AM 5 Days Post-Op  Subjective:  Says he is going home today.  Says he is ambulating well with his Darko shoe.  Tm 99.2 now afebrile HR  80's  616'W-737'T systolic 062% RA  Vitals:   04/04/16 2237 04/05/16 0621  BP: (!) 119/42 (!) 140/46  Pulse: 88 82  Resp: 18 18  Temp: 99.2 F (37.3 C) 98.3 F (36.8 C)    Physical Exam: Lungs:  Non labored Incisions:  Right groin and all other incisions are healing nicely. Extremities:  Right foot dressing in tact.    CBC    Component Value Date/Time   WBC 8.3 04/04/2016 1443   RBC 2.66 (L) 04/04/2016 1443   HGB 7.1 (L) 04/04/2016 1443   HCT 22.1 (L) 04/04/2016 1443   PLT 283 04/04/2016 1443   MCV 83.1 04/04/2016 1443   MCH 26.7 04/04/2016 1443   MCHC 32.1 04/04/2016 1443   RDW 13.9 04/04/2016 1443   LYMPHSABS 1.1 03/29/2016 1928   MONOABS 0.9 03/29/2016 1928   EOSABS 0.3 03/29/2016 1928   BASOSABS 0.0 03/29/2016 1928    BMET    Component Value Date/Time   NA 131 (L) 04/04/2016 1443   K 3.8 04/04/2016 1443   CL 91 (L) 04/04/2016 1443   CO2 22 04/04/2016 1443   GLUCOSE 226 (H) 04/04/2016 1443   BUN 72 (H) 04/04/2016 1443   CREATININE 11.32 (H) 04/04/2016 1443   CALCIUM 8.4 (L) 04/04/2016 1443   CALCIUM 13.6 (HH) 06/04/2015 1300   GFRNONAA 4 (L) 04/04/2016 1443   GFRAA 5 (L) 04/04/2016 1443    INR    Component Value Date/Time   INR 0.89 12/28/2015 1245     Intake/Output Summary (Last 24 hours) at 04/05/16 0912 Last data filed at 04/04/16 1746  Gross per 24 hour  Intake              100 ml  Output             1700 ml  Net            -1600 ml     Assessment:  56 y.o. male is s/p:  Right Fem BK Pop bypass with vein  5 Days Post-Op  Plan: -pt states he is discharging today -will ask RN to show wife how to do dressing change before discharge.   -f/u with Dr. Scot Dock in a couple weeks. Our office will call to arrange appointment. -will order case management for HHPT  and HHRN for wound checks.  Face to face filled out.  -acute blood loss anemia-received 1 unit PRBC's yesterday. No labs this am.   Leontine Locket, PA-C [facility] [phone] 04/05/2016 9:12 AM   - For VQI Registry use ---  Post-op:  Wound infection: No  Graft infection: No  Transfusion: Yes  If yes, 1 unit given New Arrhythmia: No Ipsilateral amputation: [ ]  no, [ ]  Minor, [ ]  BKA, [ ]  AKA Discharge patency: [ ]  Primary, [ ]  Primary assisted, [ ]  Secondary, [ ]  Occluded Patency judged by: [ ]  Dopper only, [ ]  Palpable graft pulse, [ ]  Palpable distal pulse, [ ]  ABI inc. > 0.15, [ ]  Duplex Discharge ABI: R unable to obtain, L  Discharge TBI: R , L  D/C Ambulatory Status: Ambulatory  Complications: MI: [ ]  No, [ ]  Troponin only, [ ]  EKG or Clinical CHF: No Resp failure: [ ]  none, [ ]  Pneumonia, [ ]   Ventilator Chg in renal function: [ ]  none, [ ]  Inc. Cr > 0.5, [ ]  Temp. Dialysis, [ ]  Permanent dialysis Stroke: [ ]  None, [ ]  Minor, [ ]  Major Return to OR: No  Reason for return to OR: [ ]  Bleeding, [ ]  Infection, [ ]  Thrombosis, [ ]  Revision  Discharge medications: Statin use:  Yes ASA use:  Yes Plavix use:  No Beta blocker use: Yes Coumadin use: No

## 2016-04-06 ENCOUNTER — Telehealth: Payer: Self-pay

## 2016-04-06 NOTE — Telephone Encounter (Signed)
Spoke with pt's wife as pt is at dialysis. She scheduled hospital follow up appt 04/12/16 with Dr. Elease Hashimoto. Nothing further needed.

## 2016-04-07 ENCOUNTER — Encounter: Payer: BLUE CROSS/BLUE SHIELD | Admitting: Vascular Surgery

## 2016-04-11 ENCOUNTER — Telehealth: Payer: Self-pay | Admitting: *Deleted

## 2016-04-11 NOTE — Telephone Encounter (Signed)
Left message for patient to return our call.

## 2016-04-11 NOTE — Telephone Encounter (Signed)
(  LANTUS SOLOSTAR) 100 UNIT/ML Solostar Pen has been identified as non-formulary related rejection under the patient's benefit.  Would you like to prescribe something different?

## 2016-04-11 NOTE — Telephone Encounter (Signed)
Find out which alternative is covered-Basaglar, Levemir, Toujeo, or [place]??

## 2016-04-12 ENCOUNTER — Encounter: Payer: Self-pay | Admitting: *Deleted

## 2016-04-12 ENCOUNTER — Ambulatory Visit (INDEPENDENT_AMBULATORY_CARE_PROVIDER_SITE_OTHER): Payer: BLUE CROSS/BLUE SHIELD | Admitting: Family Medicine

## 2016-04-12 ENCOUNTER — Encounter: Payer: Self-pay | Admitting: Cardiology

## 2016-04-12 ENCOUNTER — Ambulatory Visit (INDEPENDENT_AMBULATORY_CARE_PROVIDER_SITE_OTHER): Payer: BLUE CROSS/BLUE SHIELD | Admitting: Cardiology

## 2016-04-12 ENCOUNTER — Encounter: Payer: Self-pay | Admitting: Family Medicine

## 2016-04-12 VITALS — BP 120/68 | HR 89 | Temp 98.6°F | Wt 185.8 lb

## 2016-04-12 VITALS — BP 125/76 | HR 95 | Ht 70.0 in | Wt 191.6 lb

## 2016-04-12 DIAGNOSIS — Z01818 Encounter for other preprocedural examination: Secondary | ICD-10-CM

## 2016-04-12 DIAGNOSIS — E1129 Type 2 diabetes mellitus with other diabetic kidney complication: Secondary | ICD-10-CM | POA: Diagnosis not present

## 2016-04-12 DIAGNOSIS — E1165 Type 2 diabetes mellitus with hyperglycemia: Secondary | ICD-10-CM | POA: Diagnosis not present

## 2016-04-12 DIAGNOSIS — I10 Essential (primary) hypertension: Secondary | ICD-10-CM

## 2016-04-12 DIAGNOSIS — I5022 Chronic systolic (congestive) heart failure: Secondary | ICD-10-CM | POA: Diagnosis not present

## 2016-04-12 DIAGNOSIS — S91301A Unspecified open wound, right foot, initial encounter: Secondary | ICD-10-CM

## 2016-04-12 DIAGNOSIS — N186 End stage renal disease: Secondary | ICD-10-CM

## 2016-04-12 DIAGNOSIS — I739 Peripheral vascular disease, unspecified: Secondary | ICD-10-CM

## 2016-04-12 DIAGNOSIS — I2510 Atherosclerotic heart disease of native coronary artery without angina pectoris: Secondary | ICD-10-CM

## 2016-04-12 MED ORDER — NYSTATIN 100000 UNIT/GM EX CREA: 1.0000 "application " | TOPICAL_CREAM | Freq: Two times a day (BID) | CUTANEOUS | 2 refills | Status: DC

## 2016-04-12 NOTE — Patient Instructions (Signed)
Follow up promptly for any fever or increased redness or swelling Finish out the Cipro.

## 2016-04-12 NOTE — Progress Notes (Signed)
Subjective:     Patient ID: Anthony Bullock, male   DOB: 1960-07-17, 56 y.o.   MRN: [id number]  HPI Patient has very complicated past medical history and had recent complicated foot wound and had to undergo right fifth toe amputation along with revascularization procedure right lower extremity.  He has long-standing history of poorly controlled type 2 diabetes with end stage renal disease. Long-standing history of poor compliance with medications and continues to be poorly compliant with his sliding scale insulin. He has multiple other problems including osteoarthritis, nonischemic cardiomyopathy, hyperlipidemia, hypertension, diabetic peripheral neuropathy, COPD, CAD, anemia due to chronic kidney disease. Recent history is that he went to podiatrist and had some work done on his right fifth toenail. He subsequently developed infection and was initially placed on outpatient antibiotics. He was eventually referred to the ER and underwent right fifth toe amputation March 1. He had some drainage that started about a week after his toe amputation and was started on oral Cipro without improvement.   On 3/22 he underwent right common femoral artery to below knee popliteal artery bypass. He also had debridement of the right foot wound. Wound culture grew out Enterobacter species and stenotrophomonas species both sensitive to quinolones. He is currently taking Cipro. Wife has noticed some slight swelling dorsum right foot. No fevers or chills.  They are doing dressing changes twice daily. They have home health nurse coming out regularly and physical therapy is being scheduled.   Patient has decided he does not wish to go back to his previous podiatrist. He has scheduled follow-up with vascular surgeon later this month  Type 2 diabetes with poor control. A1c 11.3%. Lantus increased to 50 units at night. Does not take sliding scale coverage consistently. Denies recent chest pains.  No recent hypoglycemia.  Past  Medical History:  Diagnosis Date  . 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 Allergic rhinitis   . Anemia   . Arthritis   . Chronic systolic heart failure ([place])    a. ECHO (12/2012) EF 25-30%, HK entireanteroseptal myocardium //  b.  EF 25%, diffuse HK, grade 1 diastolic dysfunction, MAC, mild LAE, normal RVSF, trivial pericardial effusion  . COPD (chronic obstructive pulmonary disease) ([place])   . Diabetes mellitus type II   . Diabetic nephropathy ([place])   . Diabetic neuropathy ([place])   . ESRD on hemodialysis [facility])    started HD June 2017, goes to [facility] HD unit, Dr Hinda Lenis  . History of cardiac catheterization    a.Myoview 1/15:  There is significant left ventricular dysfunction. There may be slight scar at the apex. There is no significant ischemia. LV Ejection Fraction: 27%  //  b. RHC/LHC (1/15) with mean RA 6, PA 47/22 mean 33, mean PCWP 20, PVR 2.5 WU, CI 2.5; 80% dLAD stenosis, 70% diffuse large D.   . History of kidney stones   . Hyperlipidemia   . Hypertension   . Kidney stones   . NICM (nonischemic cardiomyopathy) ([place])    Primarily nonischemic. Echo (12/14) with EF 25-30%. Echo (3/15) with EF 25%, mild to moderately dilated LV, normal RV size and systolic function.   . Pneumonia   . Urethral stricture    Past Surgical History:  Procedure Laterality Date  . ABDOMINAL AORTOGRAM W/LOWER EXTREMITY N/A 03/30/2016   Procedure: Abdominal Aortogram w/Lower Extremity;  Surgeon: Angelia Mould, MD;  Location: [place] [facility];  Service: Cardiovascular;  Laterality: N/A;  . AV FISTULA PLACEMENT  Right 09/08/2015   Procedure: INSERTION OF 4-47m x 45cm  ARTERIOVENOUS (AV) GORE-TEX GRAFT RIGHT UPPER  ARM;  Surgeon: CAngelia Mould MD;  [place] [facility]  Service: Vascular;  Laterality: Right;  . AV FISTULA PLACEMENT Left 01/14/2016   Procedure: CREATION OF LEFT UPPER ARM ARTERIOVENOUS FISTULA;  Surgeon: CAngelia Mould MD;   [place] [facility]  Service: Vascular;  Laterality: Left;  . BASCILIC VEIN TRANSPOSITION Right 08/22/2014   Procedure: RIGHT UPPER ARM BASCILIC VEIN TRANSPOSITION;  Surgeon: CAngelia Mould MD;  [place] [facility]  Service: Vascular;  Laterality: Right;  . CARDIAC CATHETERIZATION    . CARDIAC DEFIBRILLATOR PLACEMENT  06/27/2013   Sub Q       BY DR KCaryl Comes . COLONOSCOPY WITH PROPOFOL N/A 07/22/2015   Procedure: COLONOSCOPY WITH PROPOFOL;  Surgeon: HDoran Stabler MD;  [facility];  Service: Gastroenterology;  Laterality: N/A;  . FEMORAL-POPLITEAL BYPASS GRAFT Right 03/31/2016   Procedure: BYPASS GRAFT FEMORAL-POPLITEAL ARTERY USING RIGHT GREATER SAPHENOUS NONREVERSED VEIN;  Surgeon: CAngelia Mould MD;  [place] [facility]  Service: Vascular;  Laterality: Right;  . HERNIA REPAIR    . I&D EXTREMITY Right 03/31/2016   Procedure: IRRIGATION AND DEBRIDEMENT FOOT;  Surgeon: CAngelia Mould MD;  [place] [facility]  Service: Vascular;  Laterality: Right;  . IMPLANTABLE CARDIOVERTER DEFIBRILLATOR IMPLANT N/A 06/27/2013   Procedure: SUB Q ICD;  Surgeon: SDeboraha Sprang MD;  [facility];  Service: Cardiovascular;  Laterality: N/A;  . INTRAOPERATIVE ARTERIOGRAM Right 03/31/2016   Procedure: INTRA OPERATIVE ARTERIOGRAM;  Surgeon: CAngelia Mould MD;  [place] [facility]  Service: Vascular;  Laterality: Right;  . IR GENERIC HISTORICAL Right 11/30/2015   IR THROMBECTOMY AV FISTULA W/THROMBOLYSIS/PTA INC/SHUNT/IMG RIGHT 11/30/2015 GAletta Edouard MD [facility]  . IR GENERIC HISTORICAL  11/30/2015   IR [place] ACCESS RIGHT 11/30/2015 GAletta Edouard MD [facility]  . IR GENERIC HISTORICAL Right 12/15/2015   IR THROMBECTOMY AV FISTULA W/THROMBOLYSIS/PTA/STENT INC/SHUNT/IMG RT 12/15/2015 DArne Cleveland MD [facility]  . IR GENERIC HISTORICAL  12/15/2015   IR [place] VASC ACCESS RIGHT 12/15/2015 DArne Cleveland MD [facility]  . IR GENERIC HISTORICAL  12/28/2015    IR FLUORO GUIDE CV LINE RIGHT 12/28/2015 AMarybelle Killings MD [facility]  . IR GENERIC HISTORICAL  12/28/2015   IR [place] VASC ACCESS RIGHT 12/28/2015 AMarybelle Killings MD [facility]  . LEFT A ND RIGHT HEART CATH  01/30/2013   DR BSung Amabile . LEFT AND RIGHT HEART CATHETERIZATION WITH CORONARY ANGIOGRAM N/A 01/30/2013   Procedure: LEFT AND RIGHT HEART CATHETERIZATION WITH CORONARY ANGIOGRAM;  Surgeon: DJolaine Artist MD;  [facility];  Service: Cardiovascular;  Laterality: N/A;  . PERIPHERAL VASCULAR CATHETERIZATION Right 01/26/2015   Procedure: A/V Fistulagram;  Surgeon: CAngelia Mould MD;  [facility];  Service: Cardiovascular;  Laterality: Right;  . reapea urethral surgery for recurrent obstruction  2011  . TOTAL KNEE ARTHROPLASTY Right 2007  . VEIN HARVEST Right 03/31/2016   Procedure: RIGHT GREATER SAPHENOUS VEIN HARVEST;  Surgeon: CAngelia Mould MD;  [facility]  Service: Vascular;  Laterality: Right;    reports that he quit smoking about 5 years ago. His smoking use included Cigarettes. He has a 64.00 pack-year smoking history. He has never used smokeless tobacco. He reports that he does not drink alcohol or use drugs. family history includes Alcohol abuse in his father; Bladder Cancer in his  mother; Diabetes in his maternal grandmother; Heart Problems in his maternal grandmother; Heart disease in his maternal grandfather; Melanoma in his father; Prostate cancer in his maternal grandfather; Stroke in his maternal grandmother. Allergies  Allergen Reactions  . Morphine Sulfate Rash and Other (See Comments)    REACTION: Itches all over, red spots     Review of Systems  Constitutional: Positive for fatigue. Negative for appetite change and unexpected weight change.  Eyes: Negative for visual disturbance.  Respiratory: Negative for cough, chest tightness and shortness of breath.   Cardiovascular: Negative for chest pain, palpitations and leg  swelling.  Endocrine: Negative for polydipsia and polyuria.  Neurological: Negative for dizziness, syncope, weakness, light-headedness and headaches.       Objective:   Physical Exam  Constitutional: He is oriented to person, place, and time. He appears well-developed and well-nourished.  HENT:  Right Ear: External ear normal.  Left Ear: External ear normal.  Mouth/Throat: Oropharynx is clear and moist.  Eyes: Pupils are equal, round, and reactive to light.  Neck: Neck supple. No thyromegaly present.  Cardiovascular: Normal rate and regular rhythm.   Pulmonary/Chest: Effort normal and breath sounds normal. No respiratory distress. He has no wheezes. He has no rales.  Musculoskeletal: He exhibits no edema.  Neurological: He is alert and oriented to person, place, and time.  Skin:  Patient has wound right lateral foot from previous site of fifth toe amputation and another separate wound which is about 1 center diameter lateral to that. Mild surrounding edema and mild surrounding erythema. No foul smell. Foot is warm to touch with excellent capillary refill       Assessment:     #1 recent complicated right foot wound in a patient with poorly controlled type 2 diabetes, chronic end-stage renal disease, and peripheral vascular disease  #2 poorly controlled type 2 diabetes with history of poor compliance  #3 end-stage renal disease  #4 history of nonischemic cardiomyopathy    Plan:     -Set up referral to orthopedic surgical specialist regarding his foot -Continue close follow-up with vascular surgery -Strongly encouraged improved compliance with Lantus -Finish out Cipro -Follow-up immediately for any fevers or increased redness or swelling -repeat A1C at 3 month follow up.  [name] MD [facility]

## 2016-04-12 NOTE — Patient Instructions (Signed)
Your physician wants you to follow-up in: [place] DR. BRANCH You will receive a reminder letter in the mail two months in advance. If you don't receive a letter, please call our office to schedule the follow-up appointment.  Your physician recommends that you continue on your current medications as directed. Please refer to the Current Medication list given to you today.  A chest x-ray takes a picture of the organs and structures inside the chest, including the heart, lungs, and blood vessels. This test can show several things, including, whether the heart is enlarges; whether fluid is building up in the lungs; and whether pacemaker / defibrillator leads are still in place.  Your physician has requested that you have a lexiscan myoview. For further information please visit [email or web address]. Please follow instruction sheet, as given.  WE WILL CALL YOU WHEN WE HAVE THESE TEST SCHEDULED AT [facility]   Thank you for choosing [facility]!!

## 2016-04-12 NOTE — Progress Notes (Signed)
Clinical Summary Anthony Bullock is a 55 y.o.male  seen today for follow up of the following medical problems.    1. Chronic systolic HF/NICM - echo in 2015 LVEF 25%. Most recent echo 08/2015 LVEF has improved to 50-55%. - has had some of his CHF meds decreased due to low bp's on HD.  - has ICD   - no recent SOB/DOE. No recent LE edema - compliant with meds  2. ESRD - being considered for transplant at [facility]   3. HTN - can have occasional low bp's during HD  4. CAD - cath 2015 with diffuse nonobstructive disease, worst lesion 80% small distal LAD.   - no recent chest pain.No significant SOB or DOE   5. Hyperlipidemia - 06/2015 TC 224 TG 225 HDL 35 LDL 144 - compliant with statin  6. DM2 - 06/2015 HgbA1C 8.9 - followed by pcp  7. Carotid stenosis - followed by vascular.   8. Diabetic foot ulcer - recent [facility]  Past Medical History:  Diagnosis Date  . 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 Allergic rhinitis   . Anemia   . Arthritis   . Chronic systolic heart failure ([place])    a. ECHO (12/2012) EF 25-30%, HK entireanteroseptal myocardium //  b.  EF 25%, diffuse HK, grade 1 diastolic dysfunction, MAC, mild LAE, normal RVSF, trivial pericardial effusion  . COPD (chronic obstructive pulmonary disease) ([place])   . Diabetes mellitus type II   . Diabetic nephropathy ([place])   . Diabetic neuropathy ([place])   . ESRD on hemodialysis [facility])    started HD June 2017, goes to [facility] HD unit, Dr Hinda Lenis  . History of cardiac catheterization    a.Myoview 1/15:  There is significant left ventricular dysfunction. There may be slight scar at the apex. There is no significant ischemia. LV Ejection Fraction: 27%  //  b. RHC/LHC (1/15) with mean RA 6, PA 47/22 mean 33, mean PCWP 20, PVR 2.5 WU, CI 2.5; 80% dLAD stenosis, 70% diffuse large D.   . History of kidney stones   . Hyperlipidemia   . Hypertension   .  Kidney stones   . NICM (nonischemic cardiomyopathy) ([place])    Primarily nonischemic. Echo (12/14) with EF 25-30%. Echo (3/15) with EF 25%, mild to moderately dilated LV, normal RV size and systolic function.   . Pneumonia   . Urethral stricture      Allergies  Allergen Reactions  . Morphine Sulfate Rash and Other (See Comments)    REACTION: Itches all over, red spots     Current Outpatient Prescriptions  Medication Sig Dispense Refill  . aspirin EC 81 MG tablet Take 81 mg by mouth daily.     [name] atorvastatin (LIPITOR) 80 MG tablet TAKE 1 TABLET AT BEDTIME 90 tablet 3  . azelastine (ASTELIN) 0.1 % nasal spray Place 2 sprays into both nostrils 3 (three) times daily as needed for rhinitis. Use in each nostril as directed 30 mL 12  . BD PEN NEEDLE NANO U/F 32G X 4 MM MISC USE ONCE DAILY 90 each 2  . carvedilol (COREG) 12.5 MG tablet Take 1 tablet (12.5 mg total) by mouth 2 (two) times daily with a meal. 60 tablet 0  . ciprofloxacin (CIPRO) 500 MG tablet Take 500 mg by mouth every 12 (twelve) hours.    . fenofibrate 160 MG tablet TAKE 1 TABLET DAILY 90 tablet 3  . fexofenadine (  ALLEGRA) 180 MG tablet Take 180 mg by mouth daily.    . furosemide (LASIX) 40 MG tablet Take 120 mg by mouth daily.    [name] gabapentin (NEURONTIN) 300 MG capsule Take 1 capsule (300 mg total) by mouth 3 (three) times daily. 270 capsule 2  . glucose blood test strip Check 1 time daily. E11.9 One Touch Ultra Blue Test Strips 100 each 3  . HYDROcodone-acetaminophen (NORCO/VICODIN) 5-325 MG tablet Take 1 tablet by mouth every 6 (six) hours as needed for moderate pain. 24 tablet 0  . Insulin Glargine (LANTUS SOLOSTAR) 100 UNIT/ML Solostar Pen Inject 50 Units into the skin daily at 10 pm. 15 mL 11  . insulin lispro (HUMALOG) 100 UNIT/ML injection Inject 0-14 Units into the skin 3 (three) times daily as needed for high blood sugar (per sliding scale).    [name] lanthanum (FOSRENOL) 1000 MG chewable tablet Chew 1,000 mg by mouth 3  (three) times daily with meals. Take one tablet after each meal.     . multivitamin (RENA-VIT) TABS tablet Take 1 tablet by mouth daily.  99  . mupirocin ointment (BACTROBAN) 2 % Place 1 application into the nose 2 (two) times daily as needed. (Patient taking differently: Place 1 application into the nose 2 (two) times daily as needed (rash). ) 22 g 2  . Nutritional Supplements (FEEDING SUPPLEMENT, NEPRO CARB STEADY,) LIQD Take 237 mLs by mouth 2 (two) times daily between meals. 60 Can 0  . nystatin cream (MYCOSTATIN) Apply 1 application topically 2 (two) times daily. 30 g 2  . Olopatadine HCl (PATADAY) 0.2 % SOLN Place 1 drop into both eyes daily as needed (for allergies).     [name] oxyCODONE-acetaminophen (PERCOCET) 7.5-325 MG tablet 7.5/3.25 mg once every 6 hours as needed for pain    . polyethylene glycol (MIRALAX / GLYCOLAX) packet Take 17 g by mouth daily as needed. 14 each 0   No current facility-administered medications for this visit.      Past Surgical History:  Procedure Laterality Date  . ABDOMINAL AORTOGRAM W/LOWER EXTREMITY N/A 03/30/2016   Procedure: Abdominal Aortogram w/Lower Extremity;  Surgeon: Angelia Mould, MD;  Location: [place] CV LAB;  Service: Cardiovascular;  Laterality: N/A;  . AV FISTULA PLACEMENT Right 09/08/2015   Procedure: INSERTION OF 4-51m x 45cm  ARTERIOVENOUS (AV) GORE-TEX GRAFT RIGHT UPPER  ARM;  Surgeon: CAngelia Mould MD;  [place]  Service: Vascular;  Laterality: Right;  . AV FISTULA PLACEMENT Left 01/14/2016   Procedure: CREATION OF LEFT UPPER ARM ARTERIOVENOUS FISTULA;  Surgeon: CAngelia Mould MD;  [place]  Service: Vascular;  Laterality: Left;  . BASCILIC VEIN TRANSPOSITION Right 08/22/2014   Procedure: RIGHT UPPER ARM BASCILIC VEIN TRANSPOSITION;  Surgeon: CAngelia Mould MD;  [place]  Service: Vascular;  Laterality: Right;  . CARDIAC CATHETERIZATION    . CARDIAC DEFIBRILLATOR PLACEMENT  06/27/2013     Sub Q       BY DR KCaryl Comes . COLONOSCOPY WITH PROPOFOL N/A 07/22/2015   Procedure: COLONOSCOPY WITH PROPOFOL;  Surgeon: HDoran Stabler MD;  [facility];  Service: Gastroenterology;  Laterality: N/A;  . FEMORAL-POPLITEAL BYPASS GRAFT Right 03/31/2016   Procedure: BYPASS GRAFT FEMORAL-POPLITEAL ARTERY USING RIGHT GREATER SAPHENOUS NONREVERSED VEIN;  Surgeon: CAngelia Mould MD;  [place]  Service: Vascular;  Laterality: Right;  . HERNIA REPAIR    . I&D EXTREMITY Right 03/31/2016   Procedure: IRRIGATION AND DEBRIDEMENT FOOT;  Surgeon: Angelia Mould, MD;  Location: [place];  Service: Vascular;  Laterality: Right;  . IMPLANTABLE CARDIOVERTER DEFIBRILLATOR IMPLANT N/A 06/27/2013   Procedure: SUB Q ICD;  Surgeon: Deboraha Sprang, MD;  Location: [facility] CATH LAB;  Service: Cardiovascular;  Laterality: N/A;  . INTRAOPERATIVE ARTERIOGRAM Right 03/31/2016   Procedure: INTRA OPERATIVE ARTERIOGRAM;  Surgeon: Angelia Mould, MD;  Location: [place];  Service: Vascular;  Laterality: Right;  . IR GENERIC HISTORICAL Right 11/30/2015   IR THROMBECTOMY AV FISTULA W/THROMBOLYSIS/PTA INC/SHUNT/IMG RIGHT 11/30/2015 Aletta Edouard, MD [facility]  . IR GENERIC HISTORICAL  11/30/2015   IR US GUIDE VASC ACCESS RIGHT 11/30/2015 Aletta Edouard, MD [facility]  . IR GENERIC HISTORICAL Right 12/15/2015   IR THROMBECTOMY AV FISTULA W/THROMBOLYSIS/PTA/STENT INC/SHUNT/IMG RT 12/15/2015 Arne Cleveland, MD [facility]  . IR GENERIC HISTORICAL  12/15/2015   IR US GUIDE VASC ACCESS RIGHT 12/15/2015 Arne Cleveland, MD [facility]  . IR GENERIC HISTORICAL  12/28/2015   IR FLUORO GUIDE CV LINE RIGHT 12/28/2015 Marybelle Killings, MD [facility]  . IR GENERIC HISTORICAL  12/28/2015   IR US GUIDE VASC ACCESS RIGHT 12/28/2015 Marybelle Killings, MD [facility]  . LEFT A ND RIGHT HEART CATH  01/30/2013   DR Sung Amabile  . LEFT AND RIGHT HEART CATHETERIZATION WITH CORONARY ANGIOGRAM N/A 01/30/2013   Procedure:  LEFT AND RIGHT HEART CATHETERIZATION WITH CORONARY ANGIOGRAM;  Surgeon: Jolaine Artist, MD;  Location: [facility] CATH LAB;  Service: Cardiovascular;  Laterality: N/A;  . PERIPHERAL VASCULAR CATHETERIZATION Right 01/26/2015   Procedure: A/V Fistulagram;  Surgeon: Angelia Mould, MD;  Location: [place] CV LAB;  Service: Cardiovascular;  Laterality: Right;  . reapea urethral surgery for recurrent obstruction  2011  . TOTAL KNEE ARTHROPLASTY Right 2007  . VEIN HARVEST Right 03/31/2016   Procedure: RIGHT GREATER SAPHENOUS VEIN HARVEST;  Surgeon: Angelia Mould, MD;  Location: [place];  Service: Vascular;  Laterality: Right;     Allergies  Allergen Reactions  . Morphine Sulfate Rash and Other (See Comments)    REACTION: Itches all over, red spots      Family History  Problem Relation Age of Onset  . Bladder Cancer Mother   . Alcohol abuse Father   . Melanoma Father   . Stroke Maternal Grandmother   . Heart Problems Maternal Grandmother     unknown  . Diabetes Maternal Grandmother   . Heart disease Maternal Grandfather   . Prostate cancer Maternal Grandfather      Social History Anthony Bullock reports that he quit smoking about 5 years ago. His smoking use included Cigarettes. He has a 64.00 pack-year smoking history. He has never used smokeless tobacco. Anthony Bullock reports that he does not drink alcohol.   Review of Systems CONSTITUTIONAL: No weight loss, fever, chills, weakness or fatigue.  HEENT: Eyes: No visual loss, blurred vision, double vision or yellow sclerae.No hearing loss, sneezing, congestion, runny nose or sore throat.  SKIN: No rash or itching.  CARDIOVASCULAR: per HPI RESPIRATORY: No shortness of breath, cough or sputum.  GASTROINTESTINAL: No anorexia, nausea, vomiting or diarrhea. No abdominal pain or blood.  GENITOURINARY: No burning on urination, no polyuria NEUROLOGICAL: No headache, dizziness, syncope, paralysis, ataxia, numbness or tingling in the  extremities. No change in bowel or bladder control.  MUSCULOSKELETAL: +foot pain LYMPHATICS: No enlarged nodes. No history of splenectomy.  PSYCHIATRIC: No history of depression or anxiety.  ENDOCRINOLOGIC: No reports of sweating, cold or heat intolerance. No polyuria or  polydipsia.  [name]   Physical Examination Vitals:   04/12/16 1622  BP: 125/76  Pulse: 95   Vitals:   04/12/16 1622  Weight: 191 lb 9.6 oz (86.9 kg)  Height: _0  (1.778 m)    Gen: resting comfortably, no acute distress HEENT: no scleral icterus, pupils equal round and reactive, no palptable cervical adenopathy,  CV: RRR, no m/r/g, no jvd Resp: Clear to auscultation bilaterally GI: abdomen is soft, non-tender, non-distended, normal bowel sounds, no hepatosplenomegaly MSK: extremities are warm, no edema.  Skin: warm, no rash Neuro:  no focal deficits Psych: appropriate affect   Diagnostic Studies 08/2015 echo Study Conclusions  - Left ventricle: The cavity size was normal. Systolic function was normal. The estimated ejection fraction was in the range of 50% to 55%. Wall motion was normal; there were no regional wall motion abnormalities. Doppler parameters are consistent with abnormal left ventricular relaxation (grade 1 diastolic dysfunction). - Left atrium: The atrium was mildly dilated. - Atrial septum: No defect or patent foramen ovale was identified.  Jan 2015 cath Findings:  RA = 6 RV = 48/5/7 PA = 47/22 (33) PCW = 20 Fick cardiac output/index = 5.2/2.5 PVR = 2.5 WU SVR = 1192 FA sat = 98% PA sat = 65%, 68%  Ao Pressure: 116/64 (84) LV Pressure: 122/13/18 There was no signficant gradient across the aortic valve on pullback.  Left main: Mild ostial tapering otherwise ok  LAD: Narrow vessel (due to diffuse diabetic vasculopathy). 30% ostial. 40-50% tubular lesion in the mid section. 80% lesion distally. Large diagonal with moderate diffuse disease and 70% lesion in  midsection  LCX: Non dominant vessel with diffuse diabetic vasculopathy. Large OM-1. Small OM-2 and OM-3. 2 PLs. 30% mid AV groove CX lesion. 40% lesion in proximal OM-1  RCA: Large dominant vessel with just mild plaque  Assessment: 1. CAD with diffuse diabetic vasculopathy. Only high grade lesion is in small distal LAD 2. Well-compensated hemodynamics    Assessment and Plan   1. Chronic systolic HF - LVEF has now normalized - medical therapy has been limited due to low bp's during HD, as well as dizziness - continue current meds - he required CXR for part of his kidney transplant workup  2. HTN - at goal, continue currnent meds  3. CAD - nonobstructive disease by prior cath.  - he requires a stress test for his kidney transplant workup at [facility]. We will order nuclear stress to be done at [facility]  4. Hyperlipidemia - conitnue high dose statin  5. Carotid stenosis - followed by vascular   F/u 6 months     Arnoldo Lenis, M.D., F.A.C.C.

## 2016-04-12 NOTE — Telephone Encounter (Signed)
Spoke with patient at office visit 04/12/16

## 2016-04-12 NOTE — Progress Notes (Signed)
Pre visit review using our clinic review tool, if applicable. No additional management support is needed unless otherwise documented below in the visit note. 

## 2016-04-13 ENCOUNTER — Other Ambulatory Visit: Payer: Self-pay | Admitting: *Deleted

## 2016-04-13 ENCOUNTER — Encounter: Payer: Self-pay | Admitting: *Deleted

## 2016-04-13 ENCOUNTER — Telehealth: Payer: Self-pay | Admitting: Cardiology

## 2016-04-13 DIAGNOSIS — R509 Fever, unspecified: Secondary | ICD-10-CM

## 2016-04-13 DIAGNOSIS — Z01818 Encounter for other preprocedural examination: Secondary | ICD-10-CM

## 2016-04-13 DIAGNOSIS — I5022 Chronic systolic (congestive) heart failure: Secondary | ICD-10-CM

## 2016-04-13 NOTE — Telephone Encounter (Signed)
Pre-cert Verification for the following procedure   Lexiscan Myoview scheduled for 04/21/16 at [facility].

## 2016-04-18 ENCOUNTER — Encounter: Payer: Self-pay | Admitting: Vascular Surgery

## 2016-04-19 ENCOUNTER — Ambulatory Visit (INDEPENDENT_AMBULATORY_CARE_PROVIDER_SITE_OTHER): Payer: BLUE CROSS/BLUE SHIELD | Admitting: Orthopedic Surgery

## 2016-04-19 ENCOUNTER — Encounter (INDEPENDENT_AMBULATORY_CARE_PROVIDER_SITE_OTHER): Payer: Self-pay | Admitting: Orthopedic Surgery

## 2016-04-19 ENCOUNTER — Telehealth (INDEPENDENT_AMBULATORY_CARE_PROVIDER_SITE_OTHER): Payer: Self-pay | Admitting: *Deleted

## 2016-04-19 VITALS — Ht 70.0 in | Wt 191.0 lb

## 2016-04-19 DIAGNOSIS — E1142 Type 2 diabetes mellitus with diabetic polyneuropathy: Secondary | ICD-10-CM

## 2016-04-19 DIAGNOSIS — M86271 Subacute osteomyelitis, right ankle and foot: Secondary | ICD-10-CM | POA: Diagnosis not present

## 2016-04-19 DIAGNOSIS — L02611 Cutaneous abscess of right foot: Secondary | ICD-10-CM

## 2016-04-19 MED ORDER — DOXYCYCLINE HYCLATE 100 MG PO TABS: 100.0000 mg | ORAL_TABLET | Freq: Two times a day (BID) | ORAL | 0 refills | Status: DC

## 2016-04-19 NOTE — Progress Notes (Signed)
Office Visit Note   Patient: Anthony Bullock           Date of Birth: [birth year]           MRN: [id number] Visit Date: 04/19/2016              Requested by: [name], MD [place], [place] [address] PCP: [name], MD  Chief Complaint  Patient presents with  . Right Foot - Open Wound    s/p 5th toe amputation with Dr. Irving Shows 03/10/16      HPI: Patient is a 56 year old gentleman diabetic insensate neuropathy end-stage renal disease on dialysis who was undergone excellent limb salvage intervention for his right lower extremity. He is status post a right fifth toe amputation with Dr. Irving Shows after revascularization with Dr. Scot Dock. Patient complains of a black third toe as well as a painful ulcer beneath the mid plantar aspect of the right foot. Of note patient's last hemoglobin A1c was 10%. Patient is also a former smoker.  Assessment & Plan: Visit Diagnoses:  1. Diabetic polyneuropathy associated with type 2 diabetes mellitus ([place])   2. Cutaneous abscess of right foot   3. Subacute osteomyelitis, right ankle and foot ([place])     Plan: Discussed with the patient a midfoot amputation is a possibility however with the large abscess on the plantar aspect of his foot I out there would be sufficient soft tissue to allow for coverage. With the gangrenous changes to the third toe osteomyelitis of the fifth metatarsal and large plantar abscess I feel the patient's best option is a transtibial amputation. Discussed the chance of healing a midfoot amputation is approximately 50%. Patient states he understands and would like to proceed with a transtibial amputation next week. He undergoes dialysis Monday Wednesday Friday. We will call in a Prescription for doxycycline he has completed a course of Cipro.  Follow-Up Instructions: Return in about 1 week (around 04/26/2016).   Ortho Exam  Patient is alert, oriented, no adenopathy, well-dressed, normal affect, normal  respiratory effort. On examination patient's right lower extremity is warm he does not have a palpable pulse he has a painful ulcer on the plantar aspect of the right foot which is approximately 3 cm in diameter. His bypass surgical incision is healing nicely. He has a black dry gangrenous third toe and an open ulcer with exposed fifth metatarsal head with osteomyelitis.  Imaging: No results found.  Labs: Lab Results  Component Value Date   HGBA1C 11.3 (H) 03/30/2016   HGBA1C 8.1 (H) 11/29/2015   HGBA1C 8.8 04/29/2015   ESRSEDRATE 94 (H) 03/30/2016   ESRSEDRATE 18 11/30/2012   CRP 16.1 (H) 03/30/2016   REPTSTATUS 04/05/2016 FINAL 03/31/2016   REPTSTATUS 04/04/2016 FINAL 03/31/2016   GRAMSTAIN  03/31/2016    RARE WBC PRESENT, PREDOMINANTLY PMN NO ORGANISMS SEEN    CULT NO ANAEROBES ISOLATED 03/31/2016   CULT  03/31/2016    RARE STENOTROPHOMONAS MALTOPHILIA RARE ENTEROBACTER SPECIES    LABORGA STENOTROPHOMONAS MALTOPHILIA 03/31/2016   LABORGA ENTEROBACTER SPECIES 03/31/2016    Orders:  No orders of the defined types were placed in this encounter.  No orders of the defined types were placed in this encounter.    Procedures: No procedures performed  Clinical Data: No additional findings.  ROS:  All other systems negative, except as noted in the HPI. Review of Systems  Objective: Vital Signs: Ht _0  (1.778 m)   Wt 191 lb (86.6 kg)  BMI 27.41 kg/m   Specialty Comments:  No specialty comments available.  PMFS History: Patient Active Problem List   Diagnosis Date Noted  . Cutaneous abscess of right foot 04/19/2016  . Subacute osteomyelitis, right ankle and foot ([place]) 04/19/2016  . Diabetic foot infection ([place])   . Diabetic foot ulcer ([place]) 03/30/2016  . Bilateral carotid bruits 03/30/2016  . Diabetes mellitus with complication ([place])   . Anemia due to chronic kidney disease   . Amputated toe of right foot ([place]) 03/17/2016  . ESRD (end stage renal disease)  on dialysis ([place]) 11/30/2015  . Acute respiratory failure with hypoxia ([place]) 11/29/2015  . At high risk for falls 09/29/2014  . Peripheral vascular disease ([place]) 09/29/2014  . Osteoarthritis of both knees 01/07/2014  . Osteoarthritis of multiple joints 10/06/2013  . NICM (nonischemic cardiomyopathy) ([place]) 06/27/2013  . CAD (coronary artery disease) 03/13/2013  . Abnormal nuclear stress test 01/27/2013  . Chronic systolic congestive heart failure ([place]) 01/27/2013  . COPD (chronic obstructive pulmonary disease) ([place]) 12/27/2012  . Diabetic polyneuropathy associated with type 2 diabetes mellitus ([place]) 02/10/2012  . PYELITIS/PYELONEPHRITIS DISEASES CLASSIFIED ELSW 09/28/2009  . URINARY CALCULUS 09/28/2009  . Adjustment disorder with depressed mood 08/21/2009  . Poorly controlled type II diabetes mellitus with renal complication ([place]) 37/90/2409  . Hyperlipidemia 04/14/2008  . Essential hypertension 04/14/2008  . ALLERGIC RHINITIS 04/14/2008   Past Medical History:  Diagnosis Date  . 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 Allergic rhinitis   . Anemia   . Arthritis   . Chronic systolic heart failure ([place])    a. ECHO (12/2012) EF 25-30%, HK entireanteroseptal myocardium //  b.  EF 25%, diffuse HK, grade 1 diastolic dysfunction, MAC, mild LAE, normal RVSF, trivial pericardial effusion  . COPD (chronic obstructive pulmonary disease) ([place])   . Diabetes mellitus type II   . Diabetic nephropathy ([place])   . Diabetic neuropathy ([place])   . ESRD on hemodialysis [facility])    started HD June 2017, goes to [facility] HD unit, Dr Hinda Lenis  . History of cardiac catheterization    a.Myoview 1/15:  There is significant left ventricular dysfunction. There may be slight scar at the apex. There is no significant ischemia. LV Ejection Fraction: 27%  //  b. RHC/LHC (1/15) with mean RA 6, PA 47/22 mean 33, mean PCWP 20, PVR 2.5 WU, CI 2.5; 80% dLAD stenosis, 70% diffuse large  D.   . History of kidney stones   . Hyperlipidemia   . Hypertension   . Kidney stones   . NICM (nonischemic cardiomyopathy) ([place])    Primarily nonischemic. Echo (12/14) with EF 25-30%. Echo (3/15) with EF 25%, mild to moderately dilated LV, normal RV size and systolic function.   . Pneumonia   . Urethral stricture     Family History  Problem Relation Age of Onset  . Bladder Cancer Mother   . Alcohol abuse Father   . Melanoma Father   . Stroke Maternal Grandmother   . Heart Problems Maternal Grandmother     unknown  . Diabetes Maternal Grandmother   . Heart disease Maternal Grandfather   . Prostate cancer Maternal Grandfather     Past Surgical History:  Procedure Laterality Date  . ABDOMINAL AORTOGRAM W/LOWER EXTREMITY N/A 03/30/2016   Procedure: Abdominal Aortogram w/Lower Extremity;  Surgeon: Angelia Mould, MD;  Location: [place] CV LAB;  Service: Cardiovascular;  Laterality: N/A;  . AV FISTULA PLACEMENT Right 09/08/2015  Procedure: INSERTION OF 4-24m x 45cm  ARTERIOVENOUS (AV) GORE-TEX GRAFT RIGHT UPPER  ARM;  Surgeon: CAngelia Mould MD;  [place]  Service: Vascular;  Laterality: Right;  . AV FISTULA PLACEMENT Left 01/14/2016   Procedure: CREATION OF LEFT UPPER ARM ARTERIOVENOUS FISTULA;  Surgeon: CAngelia Mould MD;  [place]  Service: Vascular;  Laterality: Left;  . BASCILIC VEIN TRANSPOSITION Right 08/22/2014   Procedure: RIGHT UPPER ARM BASCILIC VEIN TRANSPOSITION;  Surgeon: CAngelia Mould MD;  [place]  Service: Vascular;  Laterality: Right;  . CARDIAC CATHETERIZATION    . CARDIAC DEFIBRILLATOR PLACEMENT  06/27/2013   Sub Q       BY DR KCaryl Comes . COLONOSCOPY WITH PROPOFOL N/A 07/22/2015   Procedure: COLONOSCOPY WITH PROPOFOL;  Surgeon: HDoran Stabler MD;  [facility];  Service: Gastroenterology;  Laterality: N/A;  . FEMORAL-POPLITEAL BYPASS GRAFT Right 03/31/2016   Procedure: BYPASS GRAFT  FEMORAL-POPLITEAL ARTERY USING RIGHT GREATER SAPHENOUS NONREVERSED VEIN;  Surgeon: CAngelia Mould MD;  [place]  Service: Vascular;  Laterality: Right;  . HERNIA REPAIR    . I&D EXTREMITY Right 03/31/2016   Procedure: IRRIGATION AND DEBRIDEMENT FOOT;  Surgeon: CAngelia Mould MD;  [place]  Service: Vascular;  Laterality: Right;  . IMPLANTABLE CARDIOVERTER DEFIBRILLATOR IMPLANT N/A 06/27/2013   Procedure: SUB Q ICD;  Surgeon: SDeboraha Sprang MD;  [facility];  Service: Cardiovascular;  Laterality: N/A;  . INTRAOPERATIVE ARTERIOGRAM Right 03/31/2016   Procedure: INTRA OPERATIVE ARTERIOGRAM;  Surgeon: CAngelia Mould MD;  [place]  Service: Vascular;  Laterality: Right;  . IR GENERIC HISTORICAL Right 11/30/2015   IR THROMBECTOMY AV FISTULA W/THROMBOLYSIS/PTA INC/SHUNT/IMG RIGHT 11/30/2015 GAletta Edouard MD [facility]  . IR GENERIC HISTORICAL  11/30/2015   IR [place] VASC ACCESS RIGHT 11/30/2015 GAletta Edouard MD [facility]  . IR GENERIC HISTORICAL Right 12/15/2015   IR THROMBECTOMY AV FISTULA W/THROMBOLYSIS/PTA/STENT INC/SHUNT/IMG RT 12/15/2015 DArne Cleveland MD [facility]  . IR GENERIC HISTORICAL  12/15/2015   IR [place] VASC ACCESS RIGHT 12/15/2015 DArne Cleveland MD [facility]  . IR GENERIC HISTORICAL  12/28/2015   IR FLUORO GUIDE CV LINE RIGHT 12/28/2015 AMarybelle Killings MD [facility]  . IR GENERIC HISTORICAL  12/28/2015   IR [place] VASC ACCESS RIGHT 12/28/2015 AMarybelle Killings MD [facility]  . LEFT A ND RIGHT HEART CATH  01/30/2013   DR BSung Amabile . LEFT AND RIGHT HEART CATHETERIZATION WITH CORONARY ANGIOGRAM N/A 01/30/2013   Procedure: LEFT AND RIGHT HEART CATHETERIZATION WITH CORONARY ANGIOGRAM;  Surgeon: DJolaine Artist MD;  [facility];  Service: Cardiovascular;  Laterality: N/A;  . PERIPHERAL VASCULAR CATHETERIZATION Right 01/26/2015   Procedure: A/V Fistulagram;  Surgeon: CAngelia Mould MD;   [place];  Service: Cardiovascular;  Laterality: Right;  . reapea urethral surgery for recurrent obstruction  2011  . TOTAL KNEE ARTHROPLASTY Right 2007  . VEIN HARVEST Right 03/31/2016   Procedure: RIGHT GREATER SAPHENOUS VEIN HARVEST;  Surgeon: CAngelia Mould MD;  [place]  Service: Vascular;  Laterality: Right;   Social History   Occupational History  . Not on file.   Social History Main Topics  . Smoking status: Former Smoker    Packs/day: 2.00    Years: 32.00    Types: Cigarettes    Quit date: 05/11/2010  . Smokeless tobacco: Never Used  . Alcohol use No  .  Drug use: No  . Sexual activity: Yes

## 2016-04-19 NOTE — Telephone Encounter (Signed)
Juliann Pulse from [facility] called this afternoon in regards to wanting to let us know about this patient's wound on his right foot. The third digit toe is blue and that is a new thing it has not been that way. The bottom of his foot that is swollen is also new. She wanted to make Dr. Sharol Given aware of this please. Her CB # [phone]. Thank you

## 2016-04-19 NOTE — Telephone Encounter (Signed)
Pt has already been evaluated in office and that this assessment has been verified. He is being seen for surgery next week.

## 2016-04-21 ENCOUNTER — Encounter: Payer: Self-pay | Admitting: Vascular Surgery

## 2016-04-25 ENCOUNTER — Other Ambulatory Visit (INDEPENDENT_AMBULATORY_CARE_PROVIDER_SITE_OTHER): Payer: Self-pay | Admitting: Family

## 2016-04-25 MED ORDER — SODIUM CHLORIDE 0.9 % IV SOLN: 100.0000 mL | INTRAVENOUS | Status: DC | PRN

## 2016-04-25 MED ORDER — LIDOCAINE-PRILOCAINE 2.5-2.5 % EX CREA: 1.0000 "application " | TOPICAL_CREAM | CUTANEOUS | Status: DC | PRN

## 2016-04-25 MED ORDER — LIDOCAINE HCL (PF) 1 % IJ SOLN: 5.0000 mL | INTRAMUSCULAR | Status: DC | PRN

## 2016-04-25 MED ORDER — ALTEPLASE 2 MG IJ SOLR: 2.0000 mg | Freq: Once | INTRAMUSCULAR | Status: DC | PRN

## 2016-04-25 MED ORDER — HEPARIN SODIUM (PORCINE) 1000 UNIT/ML DIALYSIS
1000.0000 [IU] | INTRAMUSCULAR | Status: DC | PRN
Start: 2016-04-25 — End: 2016-04-25

## 2016-04-25 MED ORDER — PENTAFLUOROPROP-TETRAFLUOROETH EX AERO: 1.0000 "application " | INHALATION_SPRAY | CUTANEOUS | Status: DC | PRN

## 2016-04-25 NOTE — Progress Notes (Addendum)
Anesthesia Chart Review:  Pt is a same day work up.   Pt is a 56 year old male scheduled for R BKA on 04/26/2016 with Meridee Score, MD  - Cardiologist is Carlyle Dolly, MD, last office visit 04/12/16.  - EP cardiologist is Virl Axe, last office visit 08/13/15 with Tommye Standard, PA.  - PCP is Carolann Littler, MD.  - Nephrologist is Elmarie Shiley, MD  PMH includes:  CAD (nonobstructive by 2015 cath), NICM, subcutaneous ICD [facility] [employer], implanted 07/26/94), chronic systolic HF, HTN, DM, hyperlipidemia, ESRD on dialysis (being considered for kidney transplant at [facility]), COPD, carotid stenosis. Former smoker. BMI 27.5. S/p R FPBG 03/31/16.   Medications include: ASA, lipitor, carvedilol, doxycycline, Lasix, lantus, humalog.   Labs will be obtained DOS.   EKG 08/13/15: NSR. Nonspecific ST and T wave abnormality.   Stress test done 04/22/16 at [facility] (old [facility]) but results not available yet. Done as part of work up for kidney transplant.   Carotid duplex 03/30/16:  - Findings suggest 1-39% internal carotid artery stenosis bilaterally. The left ECA exhibits elevated velocities suggestive of >50% stenosis. The left vertebral artery exhibits atypical flow, suggestive of possible proximal stenosis. Right vertebral artery was patent with antegrade flow.  Echo 08/27/15:  - Left ventricle: The cavity size was normal. Systolic function was normal. The estimated ejection fraction was in the range of 50% to 55%. Wall motion was normal; there were no regional wall motion abnormalities. Doppler parameters are consistent with abnormal left ventricular relaxation (grade 1 diastolic dysfunction). - Left atrium: The atrium was mildly dilated. - Atrial septum: No defect or patent foramen ovale was identified.  Carotid duplex 08/18/14: B ICA stenosis <40%  Cardiac cath 01/30/13:  1. CAD with diffuse diabetic vasculopathy. Only high grade lesion is in small distal LAD (80%) 2.  Well-compensated hemodynamics  Nuclear stress test 01/15/13: There is significant left ventricular dysfunction. There may be slight scar at the apex. There is no significant ischemia. LV Ejection Fraction: 27%. LV Wall Motion: Global hypokinesis. Significant left ventricular dilatation.  ICD last check I see was during visit with Ms. Charlcie Cradle 08/13/15.  Will have PAT RN contact rep for interrogation DOS.   If labs acceptable DOS, I anticipate pt can proceed as scheduled.   Willeen Cass, FNP-BC [facility]/Anesthesiology Phone: [phone] 04/25/2016 4:21 PM

## 2016-04-25 NOTE — Progress Notes (Addendum)
I instructed patient to check CBG to check CBG and if it is less than 70 to treat it withor 1/2 cup of clear juice like apple juice or cranberry juice. I instructed patient to recheck CBG in 15 minutes and if CBG is not greater than 70, to  Call [phone] (pre- op). If it is before pre-op opens to retreat as before and recheck CBG in 15 minutes. I told patient to make note of time that liquid is taken and amount, that surgical time may have to be adjusted.   Patient reports that he sometimes takes Lantus ain the evening on dialysis, "not taking it on dialysis is my way, not the way the Dr.ordered it."  "So what is the most that I should take."  I informed him that we instruct type II  diabetics to take 1/2 scheduled dose of Lantus in the evening.  I instructed patient if CBG > 220 in am to take 1/2 of SS Insulin.  I notified Marcene Brawn with [employer] that we need ICD to be interrogated prior to surgery in am, I told her patient is to arrive at 0730 and surgery is for 1000.

## 2016-04-26 ENCOUNTER — Inpatient Hospital Stay (HOSPITAL_COMMUNITY): Payer: BLUE CROSS/BLUE SHIELD | Admitting: Vascular Surgery

## 2016-04-26 ENCOUNTER — Inpatient Hospital Stay (HOSPITAL_COMMUNITY)
Admission: RE | Admit: 2016-04-26 | Discharge: 2016-04-28 | DRG: 617 | Disposition: A | Discharge status: Home Health | Payer: BLUE CROSS/BLUE SHIELD | Source: Ambulatory Visit | Attending: Orthopedic Surgery | Admitting: Orthopedic Surgery

## 2016-04-26 ENCOUNTER — Encounter (HOSPITAL_COMMUNITY): Payer: Self-pay | Admitting: *Deleted

## 2016-04-26 ENCOUNTER — Encounter (HOSPITAL_COMMUNITY)
Admission: RE | Disposition: A | Discharge status: Home Health | Payer: Self-pay | Source: Ambulatory Visit | Attending: Orthopedic Surgery

## 2016-04-26 DIAGNOSIS — L02611 Cutaneous abscess of right foot: Secondary | ICD-10-CM | POA: Diagnosis present

## 2016-04-26 DIAGNOSIS — L97519 Non-pressure chronic ulcer of other part of right foot with unspecified severity: Secondary | ICD-10-CM | POA: Diagnosis present

## 2016-04-26 DIAGNOSIS — I5022 Chronic systolic (congestive) heart failure: Secondary | ICD-10-CM | POA: Diagnosis present

## 2016-04-26 DIAGNOSIS — Z7982 Long term (current) use of aspirin: Secondary | ICD-10-CM | POA: Diagnosis not present

## 2016-04-26 DIAGNOSIS — Z95810 Presence of automatic (implantable) cardiac defibrillator: Secondary | ICD-10-CM

## 2016-04-26 DIAGNOSIS — Z885 Allergy status to narcotic agent status: Secondary | ICD-10-CM

## 2016-04-26 DIAGNOSIS — Z794 Long term (current) use of insulin: Secondary | ICD-10-CM

## 2016-04-26 DIAGNOSIS — E8889 Other specified metabolic disorders: Secondary | ICD-10-CM | POA: Diagnosis present

## 2016-04-26 DIAGNOSIS — J449 Chronic obstructive pulmonary disease, unspecified: Secondary | ICD-10-CM | POA: Diagnosis present

## 2016-04-26 DIAGNOSIS — E1169 Type 2 diabetes mellitus with other specified complication: Secondary | ICD-10-CM | POA: Diagnosis present

## 2016-04-26 DIAGNOSIS — IMO0002 Reserved for concepts with insufficient information to code with codable children: Secondary | ICD-10-CM

## 2016-04-26 DIAGNOSIS — M869 Osteomyelitis, unspecified: Secondary | ICD-10-CM | POA: Diagnosis present

## 2016-04-26 DIAGNOSIS — E785 Hyperlipidemia, unspecified: Secondary | ICD-10-CM | POA: Diagnosis present

## 2016-04-26 DIAGNOSIS — E1140 Type 2 diabetes mellitus with diabetic neuropathy, unspecified: Secondary | ICD-10-CM | POA: Diagnosis present

## 2016-04-26 DIAGNOSIS — M86271 Subacute osteomyelitis, right ankle and foot: Secondary | ICD-10-CM

## 2016-04-26 DIAGNOSIS — Z89512 Acquired absence of left leg below knee: Secondary | ICD-10-CM

## 2016-04-26 DIAGNOSIS — N186 End stage renal disease: Secondary | ICD-10-CM | POA: Diagnosis present

## 2016-04-26 DIAGNOSIS — D631 Anemia in chronic kidney disease: Secondary | ICD-10-CM | POA: Diagnosis present

## 2016-04-26 DIAGNOSIS — E1121 Type 2 diabetes mellitus with diabetic nephropathy: Secondary | ICD-10-CM | POA: Diagnosis present

## 2016-04-26 DIAGNOSIS — I429 Cardiomyopathy, unspecified: Secondary | ICD-10-CM | POA: Diagnosis present

## 2016-04-26 DIAGNOSIS — Z96651 Presence of right artificial knee joint: Secondary | ICD-10-CM | POA: Diagnosis present

## 2016-04-26 DIAGNOSIS — Z992 Dependence on renal dialysis: Secondary | ICD-10-CM | POA: Diagnosis not present

## 2016-04-26 DIAGNOSIS — Z87891 Personal history of nicotine dependence: Secondary | ICD-10-CM | POA: Diagnosis not present

## 2016-04-26 DIAGNOSIS — E11621 Type 2 diabetes mellitus with foot ulcer: Secondary | ICD-10-CM | POA: Diagnosis present

## 2016-04-26 DIAGNOSIS — Z833 Family history of diabetes mellitus: Secondary | ICD-10-CM

## 2016-04-26 DIAGNOSIS — Z79899 Other long term (current) drug therapy: Secondary | ICD-10-CM

## 2016-04-26 DIAGNOSIS — Z89511 Acquired absence of right leg below knee: Secondary | ICD-10-CM

## 2016-04-26 DIAGNOSIS — I132 Hypertensive heart and chronic kidney disease with heart failure and with stage 5 chronic kidney disease, or end stage renal disease: Secondary | ICD-10-CM | POA: Diagnosis present

## 2016-04-26 DIAGNOSIS — M868X7 Other osteomyelitis, ankle and foot: Secondary | ICD-10-CM | POA: Diagnosis present

## 2016-04-26 HISTORY — PX: BELOW KNEE LEG AMPUTATION: SUR23

## 2016-04-26 HISTORY — PX: AMPUTATION: SHX166

## 2016-04-26 LAB — BASIC METABOLIC PANEL
Anion gap: 14 (ref 5–15)
BUN: 25 mg/dL — ABNORMAL HIGH (ref 6–20)
CO2: 24 mmol/L (ref 22–32)
Calcium: 8.8 mg/dL — ABNORMAL LOW (ref 8.9–10.3)
Chloride: 98 mmol/L — ABNORMAL LOW (ref 101–111)
Creatinine, Ser: 5.73 mg/dL — ABNORMAL HIGH (ref 0.61–1.24)
GFR calc Af Amer: 12 mL/min — ABNORMAL LOW (ref >60)
GFR calc non Af Amer: 10 mL/min — ABNORMAL LOW (ref >60)
Glucose, Bld: 81 mg/dL (ref 65–99)
Potassium: 3.3 mmol/L — ABNORMAL LOW (ref 3.5–5.1)
Sodium: 136 mmol/L (ref 135–145)

## 2016-04-26 LAB — GLUCOSE, CAPILLARY
Glucose-Capillary: 78 mg/dL (ref 65–99)
Glucose-Capillary: 110 mg/dL — ABNORMAL HIGH (ref 65–99)
Glucose-Capillary: 99 mg/dL (ref 65–99)
Glucose-Capillary: 90 mg/dL (ref 65–99)
Glucose-Capillary: 150 mg/dL — ABNORMAL HIGH (ref 65–99)
Glucose-Capillary: 175 mg/dL — ABNORMAL HIGH (ref 65–99)

## 2016-04-26 LAB — CBC
HCT: 31.5 % — ABNORMAL LOW (ref 39.0–52.0)
Hemoglobin: 10.1 g/dL — ABNORMAL LOW (ref 13.0–17.0)
MCH: 26.4 pg (ref 26.0–34.0)
MCHC: 32.1 g/dL (ref 30.0–36.0)
MCV: 82.2 fL (ref 78.0–100.0)
Platelets: 275 10*3/uL (ref 150–400)
RBC: 3.83 MIL/uL — ABNORMAL LOW (ref 4.22–5.81)
RDW: 15.3 % (ref 11.5–15.5)
WBC: 9.3 10*3/uL (ref 4.0–10.5)

## 2016-04-26 SURGERY — AMPUTATION BELOW KNEE
Anesthesia: General | Site: Leg Lower | Laterality: Right

## 2016-04-26 MED ORDER — KETOROLAC TROMETHAMINE 15 MG/ML IJ SOLN: 15.0000 mg | Freq: Four times a day (QID) | INTRAMUSCULAR | Status: DC

## 2016-04-26 MED ORDER — FENTANYL CITRATE (PF) 100 MCG/2ML IJ SOLN
INTRAMUSCULAR | Status: AC
  Filled 2016-04-26: qty 2

## 2016-04-26 MED ORDER — LANTHANUM CARBONATE 500 MG PO CHEW
1000.0000 mg | CHEWABLE_TABLET | Freq: Three times a day (TID) | ORAL | Status: DC
  Administered 2016-04-26 – 2016-04-28 (×3): 1000 mg via ORAL
  Filled 2016-04-26 (×4): qty 2

## 2016-04-26 MED ORDER — METOCLOPRAMIDE HCL 5 MG/ML IJ SOLN
5.0000 mg | Freq: Three times a day (TID) | INTRAMUSCULAR | Status: DC | PRN
Start: 2016-04-26 — End: 2016-04-28

## 2016-04-26 MED ORDER — METHOCARBAMOL 500 MG PO TABS
ORAL_TABLET | ORAL | Status: AC
  Administered 2016-04-26: 500 mg
  Filled 2016-04-26: qty 1

## 2016-04-26 MED ORDER — CARVEDILOL 12.5 MG PO TABS
12.5000 mg | ORAL_TABLET | Freq: Two times a day (BID) | ORAL | Status: DC
  Administered 2016-04-26 – 2016-04-28 (×3): 12.5 mg via ORAL
  Filled 2016-04-26 (×3): qty 1

## 2016-04-26 MED ORDER — CEFAZOLIN SODIUM-DEXTROSE 2-4 GM/100ML-% IV SOLN
2.0000 g | INTRAVENOUS | Status: AC
  Administered 2016-04-26: 2 g via INTRAVENOUS
  Filled 2016-04-26: qty 100

## 2016-04-26 MED ORDER — METOCLOPRAMIDE HCL 5 MG PO TABS: 5.0000 mg | ORAL_TABLET | Freq: Three times a day (TID) | ORAL | Status: DC | PRN

## 2016-04-26 MED ORDER — FENOFIBRATE 160 MG PO TABS
160.0000 mg | ORAL_TABLET | Freq: Every day | ORAL | Status: DC
  Administered 2016-04-26 – 2016-04-28 (×3): 160 mg via ORAL
  Filled 2016-04-26 (×3): qty 1

## 2016-04-26 MED ORDER — FENTANYL CITRATE (PF) 100 MCG/2ML IJ SOLN
25.0000 ug | INTRAMUSCULAR | Status: DC | PRN
  Administered 2016-04-26 (×3): 50 ug via INTRAVENOUS

## 2016-04-26 MED ORDER — DEXTROSE 50 % IV SOLN
INTRAVENOUS | Status: AC
  Filled 2016-04-26: qty 50

## 2016-04-26 MED ORDER — INSULIN ASPART 100 UNIT/ML ~~LOC~~ SOLN
0.0000 [IU] | Freq: Three times a day (TID) | SUBCUTANEOUS | Status: DC
Start: 2016-04-26 — End: 2016-04-28
  Administered 2016-04-26: 1 [IU] via SUBCUTANEOUS

## 2016-04-26 MED ORDER — GABAPENTIN 300 MG PO CAPS
300.0000 mg | ORAL_CAPSULE | Freq: Three times a day (TID) | ORAL | Status: DC
  Administered 2016-04-26 – 2016-04-28 (×7): 300 mg via ORAL
  Filled 2016-04-26 (×6): qty 1

## 2016-04-26 MED ORDER — BISACODYL 10 MG RE SUPP: 10.0000 mg | Freq: Every day | RECTAL | Status: DC | PRN

## 2016-04-26 MED ORDER — RENA-VITE PO TABS
1.0000 | ORAL_TABLET | Freq: Every day | ORAL | Status: DC
  Administered 2016-04-26 – 2016-04-28 (×3): 1 via ORAL
  Filled 2016-04-26 (×3): qty 1

## 2016-04-26 MED ORDER — MIDAZOLAM HCL 2 MG/2ML IJ SOLN
INTRAMUSCULAR | Status: AC
  Filled 2016-04-26: qty 2

## 2016-04-26 MED ORDER — ACETAMINOPHEN 325 MG PO TABS
650.0000 mg | ORAL_TABLET | Freq: Four times a day (QID) | ORAL | Status: DC | PRN
Start: 2016-04-26 — End: 2016-04-28

## 2016-04-26 MED ORDER — DOCUSATE SODIUM 100 MG PO CAPS
100.0000 mg | ORAL_CAPSULE | Freq: Two times a day (BID) | ORAL | Status: DC
  Administered 2016-04-26 – 2016-04-28 (×5): 100 mg via ORAL
  Filled 2016-04-26 (×5): qty 1

## 2016-04-26 MED ORDER — LIDOCAINE 2% (20 MG/ML) 5 ML SYRINGE
INTRAMUSCULAR | Status: AC
  Filled 2016-04-26: qty 5

## 2016-04-26 MED ORDER — LIDOCAINE HCL (PF) 1 % IJ SOLN: 5.0000 mL | INTRAMUSCULAR | Status: DC | PRN

## 2016-04-26 MED ORDER — METHOCARBAMOL 1000 MG/10ML IJ SOLN
500.0000 mg | Freq: Four times a day (QID) | INTRAVENOUS | Status: DC | PRN
  Filled 2016-04-26: qty 5

## 2016-04-26 MED ORDER — CHLORHEXIDINE GLUCONATE 4 % EX LIQD: 60.0000 mL | Freq: Once | CUTANEOUS | Status: DC

## 2016-04-26 MED ORDER — INSULIN ASPART 100 UNIT/ML ~~LOC~~ SOLN
3.0000 [IU] | Freq: Three times a day (TID) | SUBCUTANEOUS | Status: DC
  Administered 2016-04-26: 3 [IU] via SUBCUTANEOUS

## 2016-04-26 MED ORDER — ONDANSETRON HCL 4 MG/2ML IJ SOLN: 4.0000 mg | Freq: Four times a day (QID) | INTRAMUSCULAR | Status: DC | PRN

## 2016-04-26 MED ORDER — POLYETHYLENE GLYCOL 3350 17 G PO PACK: 17.0000 g | PACK | Freq: Every day | ORAL | Status: DC | PRN

## 2016-04-26 MED ORDER — CARVEDILOL 12.5 MG PO TABS
ORAL_TABLET | ORAL | Status: AC
  Administered 2016-04-27: 12.5 mg via ORAL
  Filled 2016-04-26: qty 1

## 2016-04-26 MED ORDER — ASPIRIN EC 81 MG PO TBEC
81.0000 mg | DELAYED_RELEASE_TABLET | Freq: Every day | ORAL | Status: DC
  Administered 2016-04-26 – 2016-04-28 (×3): 81 mg via ORAL
  Filled 2016-04-26 (×3): qty 1

## 2016-04-26 MED ORDER — SODIUM CHLORIDE 0.9 % IV SOLN: 100.0000 mL | INTRAVENOUS | Status: DC | PRN

## 2016-04-26 MED ORDER — ALTEPLASE 2 MG IJ SOLR: 2.0000 mg | Freq: Once | INTRAMUSCULAR | Status: DC | PRN

## 2016-04-26 MED ORDER — MIDAZOLAM HCL 2 MG/2ML IJ SOLN
1.0000 mg | Freq: Once | INTRAMUSCULAR | Status: AC
  Administered 2016-04-26: 1 mg via INTRAVENOUS

## 2016-04-26 MED ORDER — HYDROMORPHONE HCL 1 MG/ML IJ SOLN
1.0000 mg | INTRAMUSCULAR | Status: DC | PRN
  Administered 2016-04-26 – 2016-04-27 (×4): 1 mg via INTRAVENOUS
  Filled 2016-04-26 (×3): qty 1

## 2016-04-26 MED ORDER — LIDOCAINE-PRILOCAINE 2.5-2.5 % EX CREA
1.0000 "application " | TOPICAL_CREAM | CUTANEOUS | Status: DC | PRN
  Filled 2016-04-26: qty 5

## 2016-04-26 MED ORDER — FENTANYL CITRATE (PF) 250 MCG/5ML IJ SOLN
INTRAMUSCULAR | Status: AC
  Filled 2016-04-26: qty 5

## 2016-04-26 MED ORDER — INSULIN GLARGINE 100 UNIT/ML ~~LOC~~ SOLN
25.0000 [IU] | Freq: Every day | SUBCUTANEOUS | Status: DC
  Administered 2016-04-26: 25 [IU] via SUBCUTANEOUS
  Filled 2016-04-26 (×3): qty 0.25

## 2016-04-26 MED ORDER — 0.9 % SODIUM CHLORIDE (POUR BTL) OPTIME
TOPICAL | Status: DC | PRN
  Administered 2016-04-26: 1000 mL

## 2016-04-26 MED ORDER — PENTAFLUOROPROP-TETRAFLUOROETH EX AERO: 1.0000 "application " | INHALATION_SPRAY | CUTANEOUS | Status: DC | PRN

## 2016-04-26 MED ORDER — METHOCARBAMOL 500 MG PO TABS
500.0000 mg | ORAL_TABLET | Freq: Four times a day (QID) | ORAL | Status: DC | PRN
  Administered 2016-04-27 – 2016-04-28 (×4): 500 mg via ORAL
  Filled 2016-04-26 (×4): qty 1

## 2016-04-26 MED ORDER — SODIUM CHLORIDE 0.9 % IV SOLN: INTRAVENOUS | Status: DC

## 2016-04-26 MED ORDER — MAGNESIUM CITRATE PO SOLN: 1.0000 | Freq: Once | ORAL | Status: DC | PRN

## 2016-04-26 MED ORDER — MIDAZOLAM HCL 5 MG/5ML IJ SOLN
INTRAMUSCULAR | Status: DC | PRN
  Administered 2016-04-26: 2 mg via INTRAVENOUS

## 2016-04-26 MED ORDER — LIDOCAINE HCL (CARDIAC) 20 MG/ML IV SOLN
INTRAVENOUS | Status: DC | PRN
  Administered 2016-04-26: 60 mg via INTRAVENOUS

## 2016-04-26 MED ORDER — DEXTROSE 50 % IV SOLN
25.0000 mL | Freq: Once | INTRAVENOUS | Status: AC
  Administered 2016-04-26: 25 mL via INTRAVENOUS
  Filled 2016-04-26: qty 50

## 2016-04-26 MED ORDER — PHENYLEPHRINE HCL 10 MG/ML IJ SOLN
INTRAMUSCULAR | Status: DC | PRN
  Administered 2016-04-26: 80 ug via INTRAVENOUS
  Administered 2016-04-26: 40 ug via INTRAVENOUS
  Administered 2016-04-26 (×2): 120 ug via INTRAVENOUS
  Administered 2016-04-26: 40 ug via INTRAVENOUS

## 2016-04-26 MED ORDER — PROPOFOL 10 MG/ML IV BOLUS
INTRAVENOUS | Status: AC
  Filled 2016-04-26: qty 20

## 2016-04-26 MED ORDER — FENTANYL CITRATE (PF) 100 MCG/2ML IJ SOLN
50.0000 ug | Freq: Once | INTRAMUSCULAR | Status: AC
  Administered 2016-04-26: 50 ug via INTRAVENOUS

## 2016-04-26 MED ORDER — ONDANSETRON HCL 4 MG/2ML IJ SOLN: 4.0000 mg | Freq: Once | INTRAMUSCULAR | Status: DC | PRN

## 2016-04-26 MED ORDER — ONDANSETRON HCL 4 MG PO TABS: 4.0000 mg | ORAL_TABLET | Freq: Four times a day (QID) | ORAL | Status: DC | PRN

## 2016-04-26 MED ORDER — CEFAZOLIN IN D5W 1 GM/50ML IV SOLN
1.0000 g | Freq: Four times a day (QID) | INTRAVENOUS | Status: AC
  Administered 2016-04-26 – 2016-04-27 (×3): 1 g via INTRAVENOUS
  Filled 2016-04-26 (×3): qty 50

## 2016-04-26 MED ORDER — FUROSEMIDE 40 MG PO TABS
120.0000 mg | ORAL_TABLET | Freq: Every day | ORAL | Status: DC
  Administered 2016-04-26 – 2016-04-28 (×3): 120 mg via ORAL
  Filled 2016-04-26 (×3): qty 3

## 2016-04-26 MED ORDER — PROPOFOL 10 MG/ML IV BOLUS
INTRAVENOUS | Status: DC | PRN
  Administered 2016-04-26: 160 mg via INTRAVENOUS

## 2016-04-26 MED ORDER — TRANEXAMIC ACID 1000 MG/10ML IV SOLN
2000.0000 mg | Freq: Once | INTRAVENOUS | Status: AC
  Administered 2016-04-26: 2000 mg via TOPICAL
  Filled 2016-04-26: qty 20

## 2016-04-26 MED ORDER — ATORVASTATIN CALCIUM 80 MG PO TABS
80.0000 mg | ORAL_TABLET | Freq: Every day | ORAL | Status: DC
  Administered 2016-04-26 – 2016-04-27 (×2): 80 mg via ORAL
  Filled 2016-04-26 (×2): qty 1

## 2016-04-26 MED ORDER — OXYCODONE HCL 5 MG PO TABS
ORAL_TABLET | ORAL | Status: AC
  Filled 2016-04-26: qty 10

## 2016-04-26 MED ORDER — FENTANYL CITRATE (PF) 100 MCG/2ML IJ SOLN
INTRAMUSCULAR | Status: DC | PRN
  Administered 2016-04-26: 50 ug via INTRAVENOUS

## 2016-04-26 MED ORDER — ACETAMINOPHEN 650 MG RE SUPP: 650.0000 mg | Freq: Four times a day (QID) | RECTAL | Status: DC | PRN

## 2016-04-26 MED ORDER — ONDANSETRON HCL 4 MG/2ML IJ SOLN
INTRAMUSCULAR | Status: AC
  Filled 2016-04-26: qty 2

## 2016-04-26 MED ORDER — CARVEDILOL 12.5 MG PO TABS
12.5000 mg | ORAL_TABLET | Freq: Once | ORAL | Status: AC
  Administered 2016-04-26: 12.5 mg via ORAL
  Filled 2016-04-26: qty 1

## 2016-04-26 MED ORDER — HEPARIN SODIUM (PORCINE) 1000 UNIT/ML DIALYSIS
1000.0000 [IU] | INTRAMUSCULAR | Status: DC | PRN
  Filled 2016-04-26: qty 1

## 2016-04-26 MED ORDER — SODIUM CHLORIDE 0.9 % IV SOLN
INTRAVENOUS | Status: DC
  Administered 2016-04-26: 08:00:00 via INTRAVENOUS

## 2016-04-26 MED ORDER — OXYCODONE HCL 5 MG PO TABS
5.0000 mg | ORAL_TABLET | ORAL | Status: DC | PRN
  Administered 2016-04-26 – 2016-04-28 (×14): 10 mg via ORAL
  Filled 2016-04-26 (×12): qty 2

## 2016-04-26 MED ORDER — ONDANSETRON HCL 4 MG/2ML IJ SOLN
INTRAMUSCULAR | Status: DC | PRN
  Administered 2016-04-26: 4 mg via INTRAVENOUS

## 2016-04-26 SURGICAL SUPPLY — 37 items
BLADE SAW RECIP 87.9 MT (BLADE) ×3 IMPLANT
BLADE SURG 21 STRL SS (BLADE) ×3 IMPLANT
BNDG COHESIVE 6X5 TAN STRL LF (GAUZE/BANDAGES/DRESSINGS) ×6 IMPLANT
BNDG GAUZE ELAST 4 BULKY (GAUZE/BANDAGES/DRESSINGS) ×6 IMPLANT
COVER SURGICAL LIGHT HANDLE (MISCELLANEOUS) ×3 IMPLANT
CUFF TOURNIQUET SINGLE 34IN LL (TOURNIQUET CUFF) IMPLANT
CUFF TOURNIQUET SINGLE 44IN (TOURNIQUET CUFF) IMPLANT
DRAPE INCISE IOBAN 66X45 STRL (DRAPES) IMPLANT
DRAPE U-SHAPE 47X51 STRL (DRAPES) ×3 IMPLANT
DRSG VAC ATS MED SENSATRAC (GAUZE/BANDAGES/DRESSINGS) ×3 IMPLANT
ELECT REM PT RETURN 9FT ADLT (ELECTROSURGICAL) ×3
ELECTRODE REM PT RTRN 9FT ADLT (ELECTROSURGICAL) ×1 IMPLANT
GLOVE BIOGEL PI IND STRL 9 (GLOVE) ×1 IMPLANT
GLOVE BIOGEL PI INDICATOR 9 (GLOVE) ×2
GLOVE SURG ORTHO 9.0 STRL STRW (GLOVE) ×3 IMPLANT
GOWN STRL REUS W/ TWL XL LVL3 (GOWN DISPOSABLE) ×2 IMPLANT
GOWN STRL REUS W/TWL XL LVL3 (GOWN DISPOSABLE) ×6
KIT BASIN OR (CUSTOM PROCEDURE TRAY) ×3 IMPLANT
KIT ROOM TURNOVER OR (KITS) ×3 IMPLANT
MANIFOLD NEPTUNE II (INSTRUMENTS) ×3 IMPLANT
NDL SPNL 18GX3.5 QUINCKE PK (NEEDLE) IMPLANT
NEEDLE SPNL 18GX3.5 QUINCKE PK (NEEDLE) ×3 IMPLANT
NS IRRIG 1000ML POUR BTL (IV SOLUTION) ×3 IMPLANT
PACK ORTHO EXTREMITY (CUSTOM PROCEDURE TRAY) ×3 IMPLANT
PAD ARMBOARD 7.5X6 YLW CONV (MISCELLANEOUS) ×3 IMPLANT
PREVENA INCISION MGT 90 150 (MISCELLANEOUS) ×2 IMPLANT
SPONGE LAP 18X18 X RAY DECT (DISPOSABLE) IMPLANT
STAPLER VISISTAT 35W (STAPLE) ×2 IMPLANT
STOCKINETTE IMPERVIOUS LG (DRAPES) ×3 IMPLANT
SUT SILK 2 0 (SUTURE) ×3
SUT SILK 2-0 18XBRD TIE 12 (SUTURE) ×1 IMPLANT
SUT VIC AB 1 CTX 27 (SUTURE) IMPLANT
SYRINGE 60CC LL (MISCELLANEOUS) ×2 IMPLANT
TOWEL OR 17X26 10 PK STRL BLUE (TOWEL DISPOSABLE) ×3 IMPLANT
TUBE CONNECTING 12'X1/4 (SUCTIONS) ×1
TUBE CONNECTING 12X1/4 (SUCTIONS) ×1 IMPLANT
YANKAUER SUCT BULB TIP NO VENT (SUCTIONS) ×2 IMPLANT

## 2016-04-26 NOTE — Progress Notes (Signed)
Call out to Dr. Sharol Given re pain control for pt.  Dr. Erlinda Hong on call and to return call to [phone], Ivin Booty.

## 2016-04-26 NOTE — Anesthesia Procedure Notes (Signed)
Anesthesia Regional Block: Popliteal block   Pre-Anesthetic Checklist: ,, timeout performed, Correct Patient, Correct Site, Correct Laterality, Correct Procedure, Correct Position, site marked, Risks and benefits discussed,  Surgical consent,  Pre-op evaluation,  At surgeon's request and post-op pain management  Laterality: Right  Prep: chloraprep       Needles:  Injection technique: Single-shot  Needle Type: Stimulator Needle - 80          Additional Needles:   Narrative:  Start time: 04/26/2016 10:05 AM End time: 04/26/2016 10:10 AM Injection made incrementally with aspirations every 5 mL.  Performed by: Personally   Additional Notes: 25 cc 0.5% Bupivacaine with 1:200 epi injected easily

## 2016-04-26 NOTE — H&P (Signed)
Anthony Bullock is an 56 y.o. male.   Chief Complaint: Osteomyelitis ulceration right foot HPI: Patient is a 56 year old gentleman diabetic insensate neuropathy with end-stage renal disease on dialysis who has failed conservative limb salvage on the right lower extremity who presents at this time for a right transtibial amputation.  Past Medical History:  Diagnosis Date  . 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 Allergic rhinitis   . Anemia   . Arthritis   . Chronic systolic heart failure ([place])    a. ECHO (12/2012) EF 25-30%, HK entireanteroseptal myocardium //  b.  EF 25%, diffuse HK, grade 1 diastolic dysfunction, MAC, mild LAE, normal RVSF, trivial pericardial effusion  . COPD (chronic obstructive pulmonary disease) ([place])   . Diabetes mellitus type II   . Diabetic nephropathy ([place])   . Diabetic neuropathy ([place])   . ESRD on hemodialysis [facility])    started HD June 2017, goes to [facility] HD unit, Dr Hinda Lenis  . History of cardiac catheterization    a.Myoview 1/15:  There is significant left ventricular dysfunction. There may be slight scar at the apex. There is no significant ischemia. LV Ejection Fraction: 27%  //  b. RHC/LHC (1/15) with mean RA 6, PA 47/22 mean 33, mean PCWP 20, PVR 2.5 WU, CI 2.5; 80% dLAD stenosis, 70% diffuse large D.   . History of kidney stones   . Hyperlipidemia   . Hypertension   . Kidney stones   . NICM (nonischemic cardiomyopathy) ([place])    Primarily nonischemic. Echo (12/14) with EF 25-30%. Echo (3/15) with EF 25%, mild to moderately dilated LV, normal RV size and systolic function.   . Pneumonia   . Urethral stricture     Past Surgical History:  Procedure Laterality Date  . ABDOMINAL AORTOGRAM W/LOWER EXTREMITY N/A 03/30/2016   Procedure: Abdominal Aortogram w/Lower Extremity;  Surgeon: Angelia Mould, MD;  Location: [place] CV LAB;  Service: Cardiovascular;  Laterality: N/A;  . AV FISTULA PLACEMENT Right  09/08/2015   Procedure: INSERTION OF 4-92m x 45cm  ARTERIOVENOUS (AV) GORE-TEX GRAFT RIGHT UPPER  ARM;  Surgeon: CAngelia Mould MD;  [place]  Service: Vascular;  Laterality: Right;  . AV FISTULA PLACEMENT Left 01/14/2016   Procedure: CREATION OF LEFT UPPER ARM ARTERIOVENOUS FISTULA;  Surgeon: CAngelia Mould MD;  [place]  Service: Vascular;  Laterality: Left;  . BASCILIC VEIN TRANSPOSITION Right 08/22/2014   Procedure: RIGHT UPPER ARM BASCILIC VEIN TRANSPOSITION;  Surgeon: CAngelia Mould MD;  [place]  Service: Vascular;  Laterality: Right;  . CARDIAC CATHETERIZATION    . CARDIAC DEFIBRILLATOR PLACEMENT  06/27/2013   Sub Q       BY DR KCaryl Comes . COLONOSCOPY WITH PROPOFOL N/A 07/22/2015   Procedure: COLONOSCOPY WITH PROPOFOL;  Surgeon: HDoran Stabler MD;  [facility];  Service: Gastroenterology;  Laterality: N/A;  . FEMORAL-POPLITEAL BYPASS GRAFT Right 03/31/2016   Procedure: BYPASS GRAFT FEMORAL-POPLITEAL ARTERY USING RIGHT GREATER SAPHENOUS NONREVERSED VEIN;  Surgeon: CAngelia Mould MD;  [place]  Service: Vascular;  Laterality: Right;  . HERNIA REPAIR    . I&D EXTREMITY Right 03/31/2016   Procedure: IRRIGATION AND DEBRIDEMENT FOOT;  Surgeon: CAngelia Mould MD;  [place]  Service: Vascular;  Laterality: Right;  . IMPLANTABLE CARDIOVERTER DEFIBRILLATOR IMPLANT N/A 06/27/2013   Procedure: SUB Q ICD;  Surgeon: SDeboraha Sprang MD;  [facility];  Service:  Cardiovascular;  Laterality: N/A;  . INTRAOPERATIVE ARTERIOGRAM Right 03/31/2016   Procedure: INTRA OPERATIVE ARTERIOGRAM;  Surgeon: Angelia Mould, MD;  Location: [place];  Service: Vascular;  Laterality: Right;  . IR GENERIC HISTORICAL Right 11/30/2015   IR THROMBECTOMY AV FISTULA W/THROMBOLYSIS/PTA INC/SHUNT/IMG RIGHT 11/30/2015 Aletta Edouard, MD [facility]  . IR GENERIC HISTORICAL  11/30/2015   IR US GUIDE VASC ACCESS RIGHT 11/30/2015 Aletta Edouard, MD [facility]  . IR GENERIC HISTORICAL Right 12/15/2015   IR THROMBECTOMY AV FISTULA W/THROMBOLYSIS/PTA/STENT INC/SHUNT/IMG RT 12/15/2015 Arne Cleveland, MD [facility]  . IR GENERIC HISTORICAL  12/15/2015   IR US GUIDE VASC ACCESS RIGHT 12/15/2015 Arne Cleveland, MD [facility]  . IR GENERIC HISTORICAL  12/28/2015   IR FLUORO GUIDE CV LINE RIGHT 12/28/2015 Marybelle Killings, MD [facility]  . IR GENERIC HISTORICAL  12/28/2015   IR US GUIDE VASC ACCESS RIGHT 12/28/2015 Marybelle Killings, MD [facility]  . LEFT A ND RIGHT HEART CATH  01/30/2013   DR Sung Amabile  . LEFT AND RIGHT HEART CATHETERIZATION WITH CORONARY ANGIOGRAM N/A 01/30/2013   Procedure: LEFT AND RIGHT HEART CATHETERIZATION WITH CORONARY ANGIOGRAM;  Surgeon: Jolaine Artist, MD;  Location: [facility] CATH LAB;  Service: Cardiovascular;  Laterality: N/A;  . PERIPHERAL VASCULAR CATHETERIZATION Right 01/26/2015   Procedure: A/V Fistulagram;  Surgeon: Angelia Mould, MD;  Location: [place] CV LAB;  Service: Cardiovascular;  Laterality: Right;  . reapea urethral surgery for recurrent obstruction  2011  . TOTAL KNEE ARTHROPLASTY Right 2007  . VEIN HARVEST Right 03/31/2016   Procedure: RIGHT GREATER SAPHENOUS VEIN HARVEST;  Surgeon: Angelia Mould, MD;  Location: [facility] OR;  Service: Vascular;  Laterality: Right;    Family History  Problem Relation Age of Onset  . Bladder Cancer Mother   . Alcohol abuse Father   . Melanoma Father   . Stroke Maternal Grandmother   . Heart Problems Maternal Grandmother     unknown  . Diabetes Maternal Grandmother   . Heart disease Maternal Grandfather   . Prostate cancer Maternal Grandfather    Social History:  reports that he quit smoking about 5 years ago. His smoking use included Cigarettes. He has a 64.00 pack-year smoking history. He has never used smokeless tobacco. He reports that he does not drink alcohol or use drugs.  Allergies:  Allergies  Allergen Reactions  . Morphine  Sulfate Rash and Other (See Comments)    Itches all over, red spots    Medications Prior to Admission  Medication Sig Dispense Refill  . aspirin EC 81 MG tablet Take 81 mg by mouth daily.     [name] atorvastatin (LIPITOR) 80 MG tablet TAKE 1 TABLET AT BEDTIME 90 tablet 3  . carvedilol (COREG) 12.5 MG tablet Take 1 tablet (12.5 mg total) by mouth 2 (two) times daily with a meal. 60 tablet 0  . doxycycline (VIBRA-TABS) 100 MG tablet Take 1 tablet (100 mg total) by mouth 2 (two) times daily. 60 tablet 0  . fenofibrate 160 MG tablet TAKE 1 TABLET DAILY 90 tablet 3  . fexofenadine (ALLEGRA) 180 MG tablet Take 180 mg by mouth daily.    . furosemide (LASIX) 40 MG tablet Take 120 mg by mouth daily.    [name] gabapentin (NEURONTIN) 300 MG capsule Take 1 capsule (300 mg total) by mouth 3 (three) times daily. 270 capsule 2  . Insulin Glargine (LANTUS SOLOSTAR) 100 UNIT/ML Solostar Pen Inject 50 Units into the skin daily at 10 pm. (  Patient taking differently: Inject 40 Units into the skin See admin instructions. Takes at bedtime on Sunday, Tuesday, Thursday, Saturday only. (Does not take on dialysis days)) 15 mL 11  . insulin lispro (HUMALOG) 100 UNIT/ML injection Inject 0-14 Units into the skin 3 (three) times daily before meals. Dose per sliding scale    . lanthanum (FOSRENOL) 1000 MG chewable tablet Chew 1,000 mg by mouth 3 (three) times daily with meals. Take one tablet after each meal.     . multivitamin (RENA-VIT) TABS tablet Take 1 tablet by mouth daily.  99  . nystatin cream (MYCOSTATIN) Apply 1 application topically 2 (two) times daily.  2  . polyethylene glycol (MIRALAX / GLYCOLAX) packet Take 17 g by mouth daily as needed. (Patient taking differently: Take 17 g by mouth daily as needed (constipation). ) 14 each 0  . azelastine (ASTELIN) 0.1 % nasal spray Place 2 sprays into both nostrils 3 (three) times daily as needed for rhinitis. Use in each nostril as directed 30 mL 12  . BD PEN NEEDLE NANO U/F 32G X 4  MM MISC USE ONCE DAILY 90 each 2  . glucose blood test strip Check 1 time daily. E11.9 One Touch Ultra Blue Test Strips 100 each 3  . HYDROcodone-acetaminophen (NORCO/VICODIN) 5-325 MG tablet Take 1 tablet by mouth every 6 (six) hours as needed for moderate pain. (Patient not taking: Reported on 04/22/2016) 24 tablet 0  . Olopatadine HCl (PATADAY) 0.2 % SOLN Place 1 drop into both eyes daily as needed (for allergies).       Results for orders placed or performed during the hospital encounter of 04/26/16 (from the past 48 hour(s))  Glucose, capillary     Status: None   Collection Time: 04/26/16  7:52 AM  Result Value Ref Range   Glucose-Capillary 78 65 - 99 mg/dL   Comment 1 Notify RN   Basic metabolic panel     Status: Abnormal   Collection Time: 04/26/16  8:01 AM  Result Value Ref Range   Sodium 136 135 - 145 mmol/L   Potassium 3.3 (L) 3.5 - 5.1 mmol/L    Comment: SLIGHT HEMOLYSIS   Chloride 98 (L) 101 - 111 mmol/L   CO2 24 22 - 32 mmol/L   Glucose, Bld 81 65 - 99 mg/dL   BUN 25 (H) 6 - 20 mg/dL   Creatinine, Ser 5.73 (H) 0.61 - 1.24 mg/dL   Calcium 8.8 (L) 8.9 - 10.3 mg/dL   GFR calc non Af Amer 10 (L) >60 mL/min   GFR calc Af Amer 12 (L) >60 mL/min    Comment: (NOTE) The eGFR has been calculated using the CKD EPI equation. This calculation has not been validated in all clinical situations. eGFR's persistently <60 mL/min signify possible Chronic Kidney Disease.    Anion gap 14 5 - 15  CBC     Status: Abnormal   Collection Time: 04/26/16  8:01 AM  Result Value Ref Range   WBC 9.3 4.0 - 10.5 K/uL   RBC 3.83 (L) 4.22 - 5.81 MIL/uL   Hemoglobin 10.1 (L) 13.0 - 17.0 g/dL   HCT 31.5 (L) 39.0 - 52.0 %   MCV 82.2 78.0 - 100.0 fL   MCH 26.4 26.0 - 34.0 pg   MCHC 32.1 30.0 - 36.0 g/dL   RDW 15.3 11.5 - 15.5 %   Platelets 275 150 - 400 K/uL  Glucose, capillary     Status: Abnormal   Collection Time: 04/26/16  8:59  AM  Result Value Ref Range   Glucose-Capillary 110 (H) 65 - 99  mg/dL   No results found.  Review of Systems  All other systems reviewed and are negative.   Blood pressure (!) 148/51, pulse 78, temperature 98.6 F (37 C), temperature source Oral, resp. rate 18, height 5' 10" (1.778 m), weight 189 lb (85.7 kg), SpO2 100 %. Physical Exam  On examination patient is alert oriented no adenopathy well-dressed normal affect normal strength and he has an antalgic gait. He has osteomyelitis ulceration of the right forefoot. Assessment/Plan Assessment: Diabetic insensate neuropathy end-stage renal disease on dialysis with osteomyelitis of the right foot.  Plan: We'll plan for right transtibial amputation. Risk and benefits were discussed including risk of the wound not healing need for additional surgery. Patient states he understands wishes to proceed at this time.  Newt Minion, MD 04/26/2016, 9:56 AM

## 2016-04-26 NOTE — Anesthesia Postprocedure Evaluation (Addendum)
Anesthesia Post Note  Patient: Anthony Bullock  Procedure(s) Performed: Procedure(s) (LRB): Right Below Knee Amputation (Right)  Patient location during evaluation: PACU Anesthesia Type: General Level of consciousness: awake, awake and alert and oriented Pain management: pain level controlled Vital Signs Assessment: post-procedure vital signs reviewed and stable Respiratory status: spontaneous breathing, nonlabored ventilation and respiratory function stable Cardiovascular status: blood pressure returned to baseline Anesthetic complications: no       Last Vitals:  Vitals:   04/26/16 1230 04/26/16 1312  BP:  (!) 142/41  Pulse: 69 75  Resp: 12 18  Temp:  36.1 C    Last Pain:  Vitals:   04/26/16 1312  TempSrc:   PainSc: 5                  Timothy Townsel COKER

## 2016-04-26 NOTE — Transfer of Care (Signed)
Immediate Anesthesia Transfer of Care Note  Patient: Anthony Bullock  Procedure(s) Performed: Procedure(s): Right Below Knee Amputation (Right)  Patient Location: PACU  Anesthesia Type:General  Level of Consciousness: sedated and patient cooperative  Airway & Oxygen Therapy: Patient Spontanous Breathing and Patient connected to face mask oxygen  Post-op Assessment: Report given to RN and Post -op Vital signs reviewed and stable  Post vital signs: Reviewed  Last Vitals:  Vitals:   04/26/16 0759  BP: (!) 148/51  Pulse: 78  Resp: 18  Temp: 37 C    Last Pain:  Vitals:   04/26/16 0807  TempSrc:   PainSc: 3       Patients Stated Pain Goal: 1 (35/07/57 3225)  Complications: No apparent anesthesia complications

## 2016-04-26 NOTE — Anesthesia Procedure Notes (Signed)
Procedure Name: LMA Insertion Date/Time: 04/26/2016 10:26 AM Performed by: Luciana Axe K Pre-anesthesia Checklist: Patient identified, Emergency Drugs available, Suction available and Patient being monitored Patient Re-evaluated:Patient Re-evaluated prior to inductionOxygen Delivery Method: Circle System Utilized Preoxygenation: Pre-oxygenation with 100% oxygen Intubation Type: IV induction Ventilation: Mask ventilation without difficulty LMA: LMA inserted LMA Size: 4.0 Number of attempts: 1 Airway Equipment and Method: Bite block Placement Confirmation: positive ETCO2 Tube secured with: Tape Dental Injury: Teeth and Oropharynx as per pre-operative assessment

## 2016-04-26 NOTE — Op Note (Signed)
   Date of Surgery: 04/26/2016  INDICATIONS: Mr. Carriger is a 56 y.o.-year-old male who has diabetic insensate neuropathy end-stage renal disease on dialysis status post revascularization to the right lower extremity who presents for right transtibial amputation due to osteomyelitis and ulceration right forefoot.[name]  PREOPERATIVE DIAGNOSIS: Abscess ulceration osteomyelitis right forefoot  POSTOPERATIVE DIAGNOSIS: Same.  PROCEDURE: Transtibial amputation Application of Prevena wound VAC TXA 2 g topically  SURGEON: Sharol Given, M.D.  ANESTHESIA:  general  IV FLUIDS AND URINE: See anesthesia.  ESTIMATED BLOOD LOSS: min mL.  COMPLICATIONS: None.  DESCRIPTION OF PROCEDURE: The patient was brought to the operating room and underwent a general anesthetic. After adequate levels of anesthesia were obtained patient's lower extremity was prepped using DuraPrep draped into a sterile field. A timeout was called. The foot was draped out of the sterile field with impervious stockinette. A transverse incision was made 11 cm distal to the tibial tubercle. This curved proximally and a large posterior flap was created. The tibia was transected 1 cm proximal to the skin incision. The fibula was transected just proximal to the tibial incision. The tibia was beveled anteriorly. A large posterior flap was created. The sciatic nerve was pulled cut and allowed to retract. The vascular bundles were suture ligated with 2-0 silk. The deep and superficial fascial layers were closed using #1 Vicryl. The skin was closed using staples and 2-0 nylon. The wound was injected with 2 g of TXA topical. The wound was covered with a Prevena wound VAC. There was a good suction fit. A prosthetic shrinker was applied. Patient was extubated taken to the PACU in stable condition.  Meridee Score, MD [place] 11:06 AM

## 2016-04-26 NOTE — Anesthesia Preprocedure Evaluation (Addendum)
Anesthesia Evaluation  Patient identified by MRN, date of birth, ID band Patient awake    Reviewed: Allergy & Precautions, NPO status , Patient's Chart, lab work & pertinent test results, reviewed documented beta blocker date and time   Airway Mallampati: II  TM Distance: >3 FB Neck ROM: Full    Dental  (+) Teeth Intact, Dental Advisory Given   Pulmonary former smoker,    breath sounds clear to auscultation       Cardiovascular hypertension, Pt. on medications and Pt. on home beta blockers + CAD, + Cardiac Stents and +CHF   Rhythm:Regular Rate:Normal     Neuro/Psych    GI/Hepatic   Endo/Other  diabetes, Type 2, Insulin Dependent  Renal/GU ESRF and DialysisRenal disease     Musculoskeletal   Abdominal   Peds  Hematology   Anesthesia Other Findings   Reproductive/Obstetrics                           Anesthesia Physical Anesthesia Plan  ASA: III  Anesthesia Plan: General   Post-op Pain Management:    Induction: Intravenous  Airway Management Planned: LMA  Additional Equipment:   Intra-op Plan:   Post-operative Plan:   Informed Consent: I have reviewed the patients History and Physical, chart, labs and discussed the procedure including the risks, benefits and alternatives for the proposed anesthesia with the patient or authorized representative who has indicated his/her understanding and acceptance.     Plan Discussed with: CRNA and Anesthesiologist  Anesthesia Plan Comments:         Anesthesia Quick Evaluation

## 2016-04-27 ENCOUNTER — Ambulatory Visit (HOSPITAL_COMMUNITY): Payer: BLUE CROSS/BLUE SHIELD

## 2016-04-27 ENCOUNTER — Encounter: Payer: BLUE CROSS/BLUE SHIELD | Admitting: Vascular Surgery

## 2016-04-27 ENCOUNTER — Encounter (HOSPITAL_COMMUNITY): Payer: Self-pay | Admitting: Orthopedic Surgery

## 2016-04-27 LAB — CBC
HCT: 27.7 % — ABNORMAL LOW (ref 39.0–52.0)
Hemoglobin: 8.7 g/dL — ABNORMAL LOW (ref 13.0–17.0)
MCH: 25.9 pg — ABNORMAL LOW (ref 26.0–34.0)
MCHC: 31.4 g/dL (ref 30.0–36.0)
MCV: 82.4 fL (ref 78.0–100.0)
Platelets: 238 10*3/uL (ref 150–400)
RBC: 3.36 MIL/uL — ABNORMAL LOW (ref 4.22–5.81)
RDW: 15.1 % (ref 11.5–15.5)
WBC: 7.8 10*3/uL (ref 4.0–10.5)

## 2016-04-27 LAB — RENAL FUNCTION PANEL
Albumin: 2.5 g/dL — ABNORMAL LOW (ref 3.5–5.0)
Anion gap: 10 (ref 5–15)
BUN: 25 mg/dL — ABNORMAL HIGH (ref 6–20)
CO2: 28 mmol/L (ref 22–32)
Calcium: 8 mg/dL — ABNORMAL LOW (ref 8.9–10.3)
Chloride: 96 mmol/L — ABNORMAL LOW (ref 101–111)
Creatinine, Ser: 5.24 mg/dL — ABNORMAL HIGH (ref 0.61–1.24)
GFR calc Af Amer: 13 mL/min — ABNORMAL LOW (ref >60)
GFR calc non Af Amer: 11 mL/min — ABNORMAL LOW (ref >60)
Glucose, Bld: 114 mg/dL — ABNORMAL HIGH (ref 65–99)
Phosphorus: 4 mg/dL (ref 2.5–4.6)
Potassium: 3.4 mmol/L — ABNORMAL LOW (ref 3.5–5.1)
Sodium: 134 mmol/L — ABNORMAL LOW (ref 135–145)

## 2016-04-27 LAB — GLUCOSE, CAPILLARY
Glucose-Capillary: 128 mg/dL — ABNORMAL HIGH (ref 65–99)
Glucose-Capillary: 77 mg/dL (ref 65–99)
Glucose-Capillary: 159 mg/dL — ABNORMAL HIGH (ref 65–99)
Glucose-Capillary: 127 mg/dL — ABNORMAL HIGH (ref 65–99)

## 2016-04-27 MED ORDER — HYDROMORPHONE HCL 1 MG/ML IJ SOLN
INTRAMUSCULAR | Status: AC
  Filled 2016-04-27: qty 1

## 2016-04-27 NOTE — Progress Notes (Signed)
  [facility] Progress Note   Assessment/ Plan:   1. s/p R BKA: woundVAC wasn't plugged in this AM-- plugged in now.   2.ESRD providing HD on normal sched, MWF 3. Anemia: Hgb 10.1, giving darbe 4. CKD-MBD:fosrenol and hectorol 5. Nutrition: adding on albumin 6. Hypertension: BP s were a little elevated due to pain, better now  Subjective:    Had some pain overnight (expectedly) but is feeling OK this AM.   Objective:   BP (!) 193/79   Pulse 78   Temp 98.1 F (36.7 C) (Oral)   Resp 18   Ht 5\' 10"  (1.778 m)   Wt 83.6 kg (184 lb 4.9 oz)   SpO2 99%   BMI 26.44 kg/m   Physical Exam: Gen:NAD CVS:RRR soft systolic murmur Resp:clear bilaterally  Abd: soft, nontender Ext:s/p R BKA, in postoperative sock with drain which is now plugged in  Labs: BMET  Recent Labs Lab 04/26/16 0801  NA 136  K 3.3*  CL 98*  CO2 24  GLUCOSE 81  BUN 25*  CREATININE 5.73*  CALCIUM 8.8*   CBC  Recent Labs Lab 04/26/16 0801  WBC 9.3  HGB 10.1*  HCT 31.5*  MCV 82.2  PLT 275    @IMGRELPRIORS @ Medications:    . aspirin EC  81 mg Oral Daily  . atorvastatin  80 mg Oral QHS  . carvedilol  12.5 mg Oral BID WC  . docusate sodium  100 mg Oral BID  . fenofibrate  160 mg Oral Daily  . furosemide  120 mg Oral Daily  . gabapentin  300 mg Oral TID  . insulin aspart  0-9 Units Subcutaneous TID WC  . insulin aspart  3 Units Subcutaneous TID WC  . insulin glargine  25 Units Subcutaneous QHS  . lanthanum  1,000 mg Oral TID WC  . multivitamin  1 tablet Oral Daily     Madelon Lips, MD 04/27/2016, 7:55 AM

## 2016-04-27 NOTE — Procedures (Signed)
Patient seen and examined on Hemodialysis. QB 400 via R TDC UF goal 2L.   Treatment adjusted as needed.  Will need EDW adjustment  Madelon Lips MD [facility] 8:39 AM

## 2016-04-27 NOTE — Progress Notes (Signed)
PT Cancellation Note  Patient Details Name: ABDURRAHMAN PETERSHEIM MRN: [id number] DOB: 11/18/1960   Cancelled Treatment:    Reason Eval/Treat Not Completed: Patient at procedure or test/unavailable (HD in am.  Will check back as able.  )Thanks.    Godfrey Pick Adalina Dopson 04/27/2016, 10:08 AM [facility] [phone] (pager)

## 2016-04-27 NOTE — Consult Note (Signed)
Reason for Consult: To manage dialysis and dialysis related needs Referring Physician: [name] is an 56 y.o. male with PMhx significant for HTN, DM, cardiomyopathy s/p ICD and ESRD- on HD [facility] MWF.  He presented for a poorly healing wound on his right foot- underwent R transtibial amputation on 4/17 due to poorly healing R foot wound and osteomyelitis. He is having expected post-op pain but feels well otherwise.   Dialyzes at [facility]- MWF  4 hours EDW 87- but has been below. HD [place] 2K, 2.5 calc, Dialyzer regular, Heparin 1000 bolus and 500 per hour. Access PC but also left AVF close to ready- placed in Feb, hasn't been accessed yet.  Gets epogen 5200 q treatment and hectorol 2.5 q treatment  Past Medical History:  Diagnosis Date  . 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 Allergic rhinitis   . Anemia   . Arthritis   . Chronic systolic heart failure ([place])    a. ECHO (12/2012) EF 25-30%, HK entireanteroseptal myocardium //  b.  EF 25%, diffuse HK, grade 1 diastolic dysfunction, MAC, mild LAE, normal RVSF, trivial pericardial effusion  . COPD (chronic obstructive pulmonary disease) ([place])   . Diabetes mellitus type II   . Diabetic nephropathy ([place])   . Diabetic neuropathy ([place])   . ESRD on hemodialysis [facility])    started HD June 2017, goes to [facility] HD unit, Dr Hinda Lenis  . History of cardiac catheterization    a.Myoview 1/15:  There is significant left ventricular dysfunction. There may be slight scar at the apex. There is no significant ischemia. LV Ejection Fraction: 27%  //  b. RHC/LHC (1/15) with mean RA 6, PA 47/22 mean 33, mean PCWP 20, PVR 2.5 WU, CI 2.5; 80% dLAD stenosis, 70% diffuse large D.   . History of kidney stones   . Hyperlipidemia   . Hypertension   . Kidney stones   . NICM (nonischemic cardiomyopathy) ([place])    Primarily nonischemic. Echo (12/14) with EF 25-30%. Echo (3/15) with EF 25%, mild to moderately  dilated LV, normal RV size and systolic function.   . Pneumonia   . Urethral stricture     Past Surgical History:  Procedure Laterality Date  . ABDOMINAL AORTOGRAM W/LOWER EXTREMITY N/A 03/30/2016   Procedure: Abdominal Aortogram w/Lower Extremity;  Surgeon: Angelia Mould, MD;  Location: [place] CV LAB;  Service: Cardiovascular;  Laterality: N/A;  . AMPUTATION Right 04/26/2016   Procedure: Right Below Knee Amputation;  Surgeon: Newt Minion, MD;  Location: [place];  Service: Orthopedics;  Laterality: Right;  . AV FISTULA PLACEMENT Right 09/08/2015   Procedure: INSERTION OF 4-20m x 45cm  ARTERIOVENOUS (AV) GORE-TEX GRAFT RIGHT UPPER  ARM;  Surgeon: CAngelia Mould MD;  [place]  Service: Vascular;  Laterality: Right;  . AV FISTULA PLACEMENT Left 01/14/2016   Procedure: CREATION OF LEFT UPPER ARM ARTERIOVENOUS FISTULA;  Surgeon: CAngelia Mould MD;  [place]  Service: Vascular;  Laterality: Left;  . BASCILIC VEIN TRANSPOSITION Right 08/22/2014   Procedure: RIGHT UPPER ARM BASCILIC VEIN TRANSPOSITION;  Surgeon: CAngelia Mould MD;  Location[place]  Service: Vascular;  Laterality: Right;  . BELOW KNEE LEG AMPUTATION Right 04/26/2016  . CARDIAC CATHETERIZATION    . CARDIAC DEFIBRILLATOR PLACEMENT  06/27/2013   Sub Q       BY DR KCaryl Comes . COLONOSCOPY WITH PROPOFOL N/A 07/22/2015   Procedure: COLONOSCOPY WITH  PROPOFOL;  Surgeon: Doran Stabler, MD;  Location[facility];  Service: Gastroenterology;  Laterality: N/A;  . FEMORAL-POPLITEAL BYPASS GRAFT Right 03/31/2016   Procedure: BYPASS GRAFT FEMORAL-POPLITEAL ARTERY USING RIGHT GREATER SAPHENOUS NONREVERSED VEIN;  Surgeon: Angelia Mould, MD;  Location: [place];  Service: Vascular;  Laterality: Right;  . HERNIA REPAIR    . I&D EXTREMITY Right 03/31/2016   Procedure: IRRIGATION AND DEBRIDEMENT FOOT;  Surgeon: Angelia Mould, MD;  Location: [place];  Service: Vascular;  Laterality: Right;  .  IMPLANTABLE CARDIOVERTER DEFIBRILLATOR IMPLANT N/A 06/27/2013   Procedure: SUB Q ICD;  Surgeon: Deboraha Sprang, MD;  Location: [facility] CATH LAB;  Service: Cardiovascular;  Laterality: N/A;  . INTRAOPERATIVE ARTERIOGRAM Right 03/31/2016   Procedure: INTRA OPERATIVE ARTERIOGRAM;  Surgeon: Angelia Mould, MD;  Location: [place];  Service: Vascular;  Laterality: Right;  . IR GENERIC HISTORICAL Right 11/30/2015   IR THROMBECTOMY AV FISTULA W/THROMBOLYSIS/PTA INC/SHUNT/IMG RIGHT 11/30/2015 Aletta Edouard, MD [facility]  . IR GENERIC HISTORICAL  11/30/2015   IR US GUIDE VASC ACCESS RIGHT 11/30/2015 Aletta Edouard, MD [facility]  . IR GENERIC HISTORICAL Right 12/15/2015   IR THROMBECTOMY AV FISTULA W/THROMBOLYSIS/PTA/STENT INC/SHUNT/IMG RT 12/15/2015 Arne Cleveland, MD [facility]  . IR GENERIC HISTORICAL  12/15/2015   IR US GUIDE VASC ACCESS RIGHT 12/15/2015 Arne Cleveland, MD [facility]  . IR GENERIC HISTORICAL  12/28/2015   IR FLUORO GUIDE CV LINE RIGHT 12/28/2015 Marybelle Killings, MD [facility]  . IR GENERIC HISTORICAL  12/28/2015   IR US GUIDE VASC ACCESS RIGHT 12/28/2015 Marybelle Killings, MD [facility]  . LEFT A ND RIGHT HEART CATH  01/30/2013   DR Sung Amabile  . LEFT AND RIGHT HEART CATHETERIZATION WITH CORONARY ANGIOGRAM N/A 01/30/2013   Procedure: LEFT AND RIGHT HEART CATHETERIZATION WITH CORONARY ANGIOGRAM;  Surgeon: Jolaine Artist, MD;  [facility] C[facility] LAB;  Service: Cardiovascular;  Laterality: N/A;  . PERIPHERAL VASCULAR CATHETERIZATION Right 01/26/2015   Procedure: A/V Fistulagram;  Surgeon: Angelia Mould, MD;  Location: [place] CV LAB;  Service: Cardiovascular;  Laterality: Right;  . reapea urethral surgery for recurrent obstruction  2011  . TOTAL KNEE ARTHROPLASTY Right 2007  . VEIN HARVEST Right 03/31/2016   Procedure: RIGHT GREATER SAPHENOUS VEIN HARVEST;  Surgeon: Angelia Mould, MD;  [facility] OR;  Service: Vascular;  Laterality: Right;    Family History   Problem Relation Age of Onset  . Bladder Cancer Mother   . Alcohol abuse Father   . Melanoma Father   . Stroke Maternal Grandmother   . Heart Problems Maternal Grandmother     unknown  . Diabetes Maternal Grandmother   . Heart disease Maternal Grandfather   . Prostate cancer Maternal Grandfather     Social History:  reports that he quit smoking about 5 years ago. His smoking use included Cigarettes. He has a 64.00 pack-year smoking history. He has never used smokeless tobacco. He reports that he does not drink alcohol or use drugs.  Allergies:  Allergies  Allergen Reactions  . Morphine Sulfate Rash and Other (See Comments)    Itches all over, red spots    Medications: I have reviewed the patient's current medications.   Results for orders placed or performed during the hospital encounter of 04/26/16 (from the past 48 hour(s))  Glucose, capillary     Status: None   Collection Time: 04/26/16  7:52 AM  Result Value Ref Range   Glucose-Capillary 78 65 -  99 mg/dL   Comment 1 Notify RN   Basic metabolic panel     Status: Abnormal   Collection Time: 04/26/16  8:01 AM  Result Value Ref Range   Sodium 136 135 - 145 mmol/L   Potassium 3.3 (L) 3.5 - 5.1 mmol/L    Comment: SLIGHT HEMOLYSIS   Chloride 98 (L) 101 - 111 mmol/L   CO2 24 22 - 32 mmol/L   Glucose, Bld 81 65 - 99 mg/dL   BUN 25 (H) 6 - 20 mg/dL   Creatinine, Ser 5.73 (H) 0.61 - 1.24 mg/dL   Calcium 8.8 (L) 8.9 - 10.3 mg/dL   GFR calc non Af Amer 10 (L) >60 mL/min   GFR calc Af Amer 12 (L) >60 mL/min    Comment: (NOTE) The eGFR has been calculated using the CKD EPI equation. This calculation has not been validated in all clinical situations. eGFR's persistently <60 mL/min signify possible Chronic Kidney Disease.    Anion gap 14 5 - 15  CBC     Status: Abnormal   Collection Time: 04/26/16  8:01 AM  Result Value Ref Range   WBC 9.3 4.0 - 10.5 K/uL   RBC 3.83 (L) 4.22 - 5.81 MIL/uL   Hemoglobin 10.1 (L) 13.0 - 17.0  g/dL   HCT 31.5 (L) 39.0 - 52.0 %   MCV 82.2 78.0 - 100.0 fL   MCH 26.4 26.0 - 34.0 pg   MCHC 32.1 30.0 - 36.0 g/dL   RDW 15.3 11.5 - 15.5 %   Platelets 275 150 - 400 K/uL  Glucose, capillary     Status: Abnormal   Collection Time: 04/26/16  8:59 AM  Result Value Ref Range   Glucose-Capillary 110 (H) 65 - 99 mg/dL  Glucose, capillary     Status: None   Collection Time: 04/26/16 10:09 AM  Result Value Ref Range   Glucose-Capillary 99 65 - 99 mg/dL  Glucose, capillary     Status: None   Collection Time: 04/26/16 11:15 AM  Result Value Ref Range   Glucose-Capillary 90 65 - 99 mg/dL  Glucose, capillary     Status: Abnormal   Collection Time: 04/26/16  4:31 PM  Result Value Ref Range   Glucose-Capillary 150 (H) 65 - 99 mg/dL  Glucose, capillary     Status: Abnormal   Collection Time: 04/26/16 10:19 PM  Result Value Ref Range   Glucose-Capillary 175 (H) 65 - 99 mg/dL  Glucose, capillary     Status: Abnormal   Collection Time: 04/27/16  6:34 AM  Result Value Ref Range   Glucose-Capillary 128 (H) 65 - 99 mg/dL    No results found.  ROS: pt relates right leg pain but otherwise is OK Blood pressure (!) (P) 193/79, pulse (P) 78, temperature (P) 98.1 F (36.7 C), temperature source (P) Oral, resp. rate 18, height 5' 10"  (1.778 m), weight 85.7 kg (189 lb), SpO2 (P) 99 %. GEN NAD, lying in bed HEENT EOMI, PERRL NECK no JVD CHEST: R TDC with window dressing, c/d/i PULM clear ABD benign EXT R forefoot s/p amputation, dressed  Assessment/Plan:  1 PAD- s/p R transtibial amputation 2 ESRD: continue with maintenance HD MWF- getting updated home unit orders- via PC- no heparin today or Friday 3 Hypertension: BP well controlled- only on low dose coreg- UF as able, will need EDW adjustment  4. Anemia of ESRD: gets epo q tx- will give darbe here, will need increase with home orders 5. Metabolic Bone Disease: continue  with home fosrenol and hectorol with HD  [facility]  DOS 4/17

## 2016-04-27 NOTE — Progress Notes (Signed)
Patient ID: Anthony Bullock, male   DOB: 1960-02-22, 56 y.o.   MRN: [id number] Postoperative day 1 right transtibial amputation. Patient's wound VAC was off for about 3 hours. The Prevena pump was not plugged in. Patient to dialysis this morning plan for discharge to skilled nursing.

## 2016-04-27 NOTE — Evaluation (Signed)
Physical Therapy Evaluation Patient Details Name: Anthony Bullock MRN: [id number] DOB: 07-26-1960 Today's Date: 04/27/2016   History of Present Illness  56 y.o. male with PMhx significant for HTN, DM, cardiomyopathy s/p ICD and ESRD- on HD [facility] MWF.  He presented for a poorly healing wound on his right foot- underwent R transtibial amputation on 4/17 due to poorly healing R foot wound and osteomyelitis.  Clinical Impression  Pt admitted with above diagnosis. Pt currently with functional limitations due to the deficits listed below (see PT Problem List). Pt was able to stand and pivot with min assist to recliner. Should be able to go home withequipment and HH f/u with wife.   Pt will benefit from skilled PT to increase their independence and safety with mobility to allow discharge to the venue listed below.      Follow Up Recommendations Home health PT;Supervision/Assistance - 24 hour    Equipment Recommendations  Wheelchair cushion (18x16 pressure relieving cushiion);3in1 (PT);Wheelchair (18x16 lightweight amputee chair with desk armrests and antitippers, amputee pad on right, foot rest on left) (may need drop arm 3N1)    Recommendations for Other Services       Precautions / Restrictions Precautions Precautions: Fall Precaution Comments: wound vac Required Braces or Orthoses: Other Brace/Splint Other Brace/Splint: shrinker in place on residual limb Restrictions Weight Bearing Restrictions: No      Mobility  Bed Mobility Overal bed mobility: Needs Assistance Bed Mobility: Supine to Sit     Supine to sit: Min assist     General bed mobility comments: increased time to come to EOB  Transfers Overall transfer level: Needs assistance Equipment used: Rolling walker (2 wheeled) Transfers: Sit to/from [employer] Sit to Stand: Min assist;+2 safety/equipment Stand pivot transfers: Min assist;+2 safety/equipment       General transfer comment: cues for  hand placement and safety. slight steadying assist and cues to step around and back LE to touch chair prior to sitting down.   Ambulation/Gait             General Gait Details: TBA next session  Stairs            Wheelchair Mobility    Modified Rankin (Stroke Patients Only)       Balance Overall balance assessment: Needs assistance;History of Falls Sitting-balance support: No upper extremity supported Sitting balance-Leahy Scale: Fair     Standing balance support: Bilateral upper extremity supported;During functional activity Standing balance-Leahy Scale: Poor Standing balance comment: relies on RW for support in standing for balance                             Pertinent Vitals/Pain Pain Assessment: 0-10 Pain Score: 6  Pain Location: Right LE Pain Descriptors / Indicators: Aching;Grimacing;Guarding;Operative site guarding Pain Intervention(s): Limited activity within patient's tolerance;Monitored during session;Premedicated before session;Repositioned;Ice applied    Home Living Family/patient expects to be discharged to:: Private residence Living Arrangements: Spouse/significant other Available Help at Discharge: Family;Available 24 hours/day Type of Home: House Home Access: Ramped entrance     Home Layout: One level Home Equipment: Toilet riser;Cane - single point;Crutches;Walker - 4 wheels      Prior Function Level of Independence: Independent with assistive device(s)         Comments: pt ambulates with use of RW, independent with B/D     Hand Dominance   Dominant Hand: Right    Extremity/Trunk Assessment   Upper Extremity Assessment Upper  Extremity Assessment: Defer to OT evaluation    Lower Extremity Assessment Lower Extremity Assessment: RLE deficits/detail RLE Deficits / Details: pt with decreased strength and ROM limitations secondary to post-op.  RLE: Unable to fully assess due to pain RLE Sensation: decreased light  touch    Cervical / Trunk Assessment Cervical / Trunk Assessment: Normal  Communication   Communication: No difficulties  Cognition Arousal/Alertness: Awake/alert Behavior During Therapy: WFL for tasks assessed/performed Overall Cognitive Status: Within Functional Limits for tasks assessed                                        General Comments General comments (skin integrity, edema, etc.): Wound vac in place right LE    Exercises Amputee Exercises Quad Sets: AROM;Both;5 reps Gluteal Sets: AROM;Both;5 reps;Supine Hip Extension: AROM;Both;5 reps;Supine Hip Flexion/Marching: AROM;Both;5 reps;Supine Straight Leg Raises: AROM;Both;5 reps;Supine   Assessment/Plan    PT Assessment Patient needs continued PT services  PT Problem List Decreased strength;Decreased range of motion;Decreased activity tolerance;Decreased balance;Decreased mobility;Decreased coordination;Decreased knowledge of use of DME;Decreased safety awareness;Decreased knowledge of precautions;Pain       PT Treatment Interventions DME instruction;Gait training;Functional mobility training;Therapeutic exercise;Balance training;Therapeutic activities;Neuromuscular re-education;Patient/family education;Wheelchair mobility training    PT Goals (Current goals can be found in the Care Plan section)  Acute Rehab PT Goals Patient Stated Goal: return home, decrease pain PT Goal Formulation: With patient/family Time For Goal Achievement: 05/11/16 Potential to Achieve Goals: Good    Frequency Min 5X/week   Barriers to discharge        Co-evaluation               End of Session Equipment Utilized During Treatment: Gait belt Activity Tolerance: Patient tolerated treatment well Patient left: in chair;with call bell/phone within reach;with family/visitor present Nurse Communication: Mobility status PT Visit Diagnosis: Difficulty in walking, not elsewhere classified (R26.2);Pain Pain - Right/Left:  Right Pain - part of body: Leg    Time: 7829-5621 PT Time Calculation (min) (ACUTE ONLY): 25 min   Charges:   PT Evaluation $PT Eval Moderate Complexity: 1 Procedure PT Treatments $Therapeutic Activity: 8-22 mins   PT G Codes:        Kosta Schnitzler,PT Acute Rehabilitation [phone] (pager)   Berline Lopes 04/27/2016, 1:54 PM

## 2016-04-28 LAB — GLUCOSE, CAPILLARY
Glucose-Capillary: 82 mg/dL (ref 65–99)
Glucose-Capillary: 110 mg/dL — ABNORMAL HIGH (ref 65–99)

## 2016-04-28 MED ORDER — OXYCODONE-ACETAMINOPHEN 5-325 MG PO TABS: 1.0000 | ORAL_TABLET | ORAL | 0 refills | Status: DC | PRN

## 2016-04-28 MED ORDER — METHOCARBAMOL 500 MG PO TABS: 500.0000 mg | ORAL_TABLET | Freq: Three times a day (TID) | ORAL | 0 refills | Status: DC

## 2016-04-28 NOTE — Progress Notes (Signed)
Physical Therapy Treatment Patient Details Name: Anthony Bullock MRN: [id number] DOB: Jan 24, 1960 Today's Date: 04/28/2016    History of Present Illness 56 y.o. male with PMhx significant for HTN, DM, cardiomyopathy s/p ICD and ESRD- on HD [facility] MWF.  He presented for a poorly healing wound on his right foot- underwent R transtibial amputation on 4/17 due to poorly healing R foot wound and osteomyelitis.    PT Comments    Patient was seen with wife present. Pt reports he is discharging home today and informed that the walker he has at home is a [employer] investment banker. Due to safety this PTA has updated equipment recommendation to include a RW after discussing with evaluating PT. Patient ambulated 15 ft x2 today with one seated rest break due to fatigue. Educated pt on the importance of positioning and stretching RLE to avoid flexion contractures and provided demonstration and handout of hip extension stretch.   Follow Up Recommendations  Home health PT;Supervision/Assistance - 24 hour     Equipment Recommendations  Wheelchair cushion (measurements PT);3in1 (PT);Wheelchair (measurements PT);Rolling walker with 5" wheels (may need drop arm 3N1)       Precautions / Restrictions Precautions Precautions: Fall Precaution Comments: wound vac Required Braces or Orthoses: Other Brace/Splint Other Brace/Splint: shrinker in place on residual limb Restrictions Weight Bearing Restrictions: No RLE Weight Bearing: Non weight bearing    Mobility  Bed Mobility Overal bed mobility: Needs Assistance Bed Mobility: Supine to Sit     Supine to sit: Supervision     General bed mobility comments: increased time to come to EOB and VC required  Transfers Overall transfer level: Needs assistance Equipment used: Rolling walker (2 wheeled) Transfers: Sit to/from Stand Sit to Stand: Min assist         General transfer comment: cues for hand placement and safety. Cues touch LE to chair prior to sitting down.    Ambulation/Gait Ambulation/Gait assistance: Min guard Ambulation Distance (Feet): 30 Feet (15x2 with seated rest break) Assistive device: Rolling walker (2 wheeled) Gait Pattern/deviations: Trunk flexed (Hopping on LLE to progress forward)   Gait velocity interpretation: Below normal speed for age/gender General Gait Details: Patient takes short hops using the RW for support and needs frequent steated rest breaks due to fatigue       Balance Overall balance assessment: Needs assistance;History of Falls Sitting-balance support: Feet supported;Single extremity supported Sitting balance-Leahy Scale: Fair     Standing balance support: Bilateral upper extremity supported;During functional activity Standing balance-Leahy Scale: Poor Standing balance comment: relies on RW for support in standing for balance                            Cognition Arousal/Alertness: Awake/alert Behavior During Therapy: WFL for tasks assessed/performed Overall Cognitive Status: Within Functional Limits for tasks assessed                                        Exercises Amputee Exercises Hip Extension: AROM;Right;Sidelying (demonstrated and provided handout to pt.)    General Comments General comments (skin integrity, edema, etc.): Wound vac in place right LE      Pertinent Vitals/Pain Pain Assessment: 0-10 Pain Score: 5  Pain Location: Right LE Pain Descriptors / Indicators: Aching;Grimacing;Guarding;Operative site guarding           PT Goals (current goals can now be found in the  care plan section) Acute Rehab PT Goals Patient Stated Goal: return home, decrease pain PT Goal Formulation: With patient/family Time For Goal Achievement: 05/11/16 Potential to Achieve Goals: Good Progress towards PT goals: Progressing toward goals    Frequency    Min 5X/week      PT Plan Current plan remains appropriate;Equipment recommendations need to be updated (Added  RW to equiptment needed)    Co-evaluation PT/OT/SLP Co-Evaluation/Treatment: Yes           End of Session Equipment Utilized During Treatment: Gait belt Activity Tolerance: Patient tolerated treatment well;Patient limited by fatigue Patient left: in chair;with call bell/phone within reach;with family/visitor present Nurse Communication: Mobility status PT Visit Diagnosis: Difficulty in walking, not elsewhere classified (R26.2);Pain Pain - Right/Left: Right Pain - part of body: Leg     Time: 5885-0277 PT Time Calculation (min) (ACUTE ONLY): 30 min  Charges:  $Gait Training: 8-22 mins $Therapeutic Activity: 8-22 mins                    G Codes:       Benjiman Core, PTA Acute Rehab [place] 04/28/2016, 12:32 PM

## 2016-04-28 NOTE — Progress Notes (Signed)
D/c instructions reviewed with pt and his wife. Copy of instructions and scripts given to pt. Handouts given on medications, living with amputation, stump and prosthesis care, and phantom limb pain. Pt d/c'd with wound VAC-Prevena, pt and wife understand how to care for, to f/u with Dr Sharol Given in one week for removal of VAC.   Pt d/c'd with belongings via wheelchair with assist of 2 RN's to assist pt into his truck for transport home, wife states she has help at home to help with getting pt out of truck and into house.

## 2016-04-28 NOTE — Care Management Note (Addendum)
Case Management Note  Patient Details  Name: CHEVEYO VIRGINIA MRN: [id number] Date of Birth: 07/09/1960  Subjective/Objective:   56 yr old gentleman admitted with osteomyelitis ulceration right foot, patient underwent right BKA.                  Action/Plan: Case manager spoke with patient and his wife concerning dischrge plan and DME needs. Choice for Home Health agency was offered. Patient states he has used [facility]. CM called referral to Stevie Kern, [facility] Liaison who states patient is active with them. Cm has ordered wheelchair and 3in1. Patient has Prevena wound vac to right BKA, this will remain intact until his followup office visit with Dr. Sharol Given. CM Provided information on [facility] program to patient. Patient will be going to his  Outpatient dialysis treatment at discharge.     Expected Discharge Date:  04/28/16               Expected Discharge Plan:  [place]  In-House Referral:     Discharge planning Services  CM Consult  Post Acute Care Choice:  Durable Medical Equipment, Home Health, Resumption of Svcs/PTA Provider Choice offered to:  Patient, Spouse  DME Arranged:  3-N-1, Wheelchair manual DME Agency:  [place]:  PT [place]:  [place]  Status of Service:     If discussed at [place] of Stay Meetings, dates discussed:    Additional Comments:  Ninfa Meeker, RN 04/28/2016, 10:36 AM

## 2016-04-28 NOTE — Progress Notes (Signed)
  [facility] Progress Note   Assessment/ Plan:   1. s/p R BKA: to d/c with woundvac 2.ESRD providing HD on normal sched, MWF 3. Anemia: Hgb 10.1, giving darbe 4. CKD-MBD:fosrenol and hectorol 5. Nutrition: adding on albumin 6. Hypertension: BP s were a little elevated due to pain, better now  Subjective:    Had some pain overnight (expectedly) but is feeling OK this AM.   Objective:   BP (!) 159/48 (BP Location: Right Arm)   Pulse 90   Temp 99.1 F (37.3 C) (Oral)   Resp 16   Ht 5\' 10"  (1.778 m)   Wt 81.6 kg (179 lb 14.3 oz)   SpO2 97%   BMI 25.81 kg/m   Physical Exam: Gen:NAD CVS:RRR soft systolic murmur Resp:clear bilaterally  Abd: soft, nontender Ext:s/p R BKA, + woundvac  Labs: BMET  Recent Labs Lab 04/26/16 0801 04/27/16 0620  NA 136 134*  K 3.3* 3.4*  CL 98* 96*  CO2 24 28  GLUCOSE 81 114*  BUN 25* 25*  CREATININE 5.73* 5.24*  CALCIUM 8.8* 8.0*  PHOS  --  4.0   CBC  Recent Labs Lab 04/26/16 0801 04/27/16 0620  WBC 9.3 7.8  HGB 10.1* 8.7*  HCT 31.5* 27.7*  MCV 82.2 82.4  PLT 275 238    @IMGRELPRIORS @ Medications:    . aspirin EC  81 mg Oral Daily  . atorvastatin  80 mg Oral QHS  . carvedilol  12.5 mg Oral BID WC  . docusate sodium  100 mg Oral BID  . fenofibrate  160 mg Oral Daily  . furosemide  120 mg Oral Daily  . gabapentin  300 mg Oral TID  . insulin aspart  0-9 Units Subcutaneous TID WC  . insulin aspart  3 Units Subcutaneous TID WC  . insulin glargine  25 Units Subcutaneous QHS  . lanthanum  1,000 mg Oral TID WC  . multivitamin  1 tablet Oral Daily     Madelon Lips, MD 04/28/2016, 12:13 PM

## 2016-04-28 NOTE — Discharge Summary (Signed)
Discharge Diagnoses:  Active Problems:   Below knee amputation status, right [facility])   Surgeries: Procedure(s): Right Below Knee Amputation on 04/26/2016    Consultants: Treatment Team:  Madelon Lips, MD  Discharged Condition: Improved  Hospital Course: Anthony Bullock is an 56 y.o. male who was admitted 04/26/2016 with a chief complaint of osteomyelitis ulceration right foot, with a final diagnosis of Osteomyelitis, Abscess Right Foot.  Patient was brought to the operating room on 04/26/2016 and underwent Procedure(s): Right Below Knee Amputation.    Patient was given perioperative antibiotics: Anti-infectives    Start     Dose/Rate Route Frequency Ordered Stop   04/26/16 1630  ceFAZolin (ANCEF) IVPB 1 g/50 mL premix     1 g 100 mL/hr over 30 Minutes Intravenous Every 6 hours 04/26/16 1333 04/27/16 0432   04/26/16 0800  ceFAZolin (ANCEF) IVPB 2g/100 mL premix     2 g 200 mL/hr over 30 Minutes Intravenous On call to O.R. 04/26/16 5732 04/26/16 1042    .  Patient was given sequential compression devices, early ambulation, and aspirin for DVT prophylaxis.  Recent vital signs: Patient Vitals for the past 24 hrs:  BP Temp Temp src Pulse Resp SpO2 Weight  04/28/16 0638 (!) 159/48 99.1 F (37.3 C) Oral 90 16 97 % -  04/27/16 2057 (!) 147/47 99 F (37.2 C) Oral 86 16 100 % -  04/27/16 1400 (!) 130/44 98.7 F (37.1 C) - 81 16 100 % -  04/27/16 1105 (!) 180/49 97.5 F (36.4 C) Oral 84 18 99 % 179 lb 14.3 oz (81.6 kg)  04/27/16 1100 (!) 181/63 - - 83 - - -  04/27/16 1030 (!) 169/59 - - 82 - - -  04/27/16 1000 (!) 154/51 - - 75 - - -  04/27/16 0930 (!) 184/52 - - 81 - - -  04/27/16 0900 (!) 173/63 - - 80 - - -  04/27/16 0830 (!) 161/78 - - 84 - - -  04/27/16 0800 (!) 173/73 - - 83 - - -  04/27/16 0730 (!) 169/76 - - 86 - - -  04/27/16 0703 (!) 193/79 - - 78 - - -  04/27/16 0657 (!) 168/73 98.1 F (36.7 C) Oral 83 - 99 % 184 lb 4.9 oz (83.6 kg)  .  Recent laboratory studies: No  results found.  Discharge Medications:   Allergies as of 04/28/2016      Reactions   Morphine Sulfate Rash, Other (See Comments)   Itches all over, red spots      Medication List    STOP taking these medications   doxycycline 100 MG tablet Commonly known as:  VIBRA-TABS     TAKE these medications   aspirin EC 81 MG tablet Take 81 mg by mouth daily.   atorvastatin 80 MG tablet Commonly known as:  LIPITOR TAKE 1 TABLET AT BEDTIME   azelastine 0.1 % nasal spray Commonly known as:  ASTELIN Place 2 sprays into both nostrils 3 (three) times daily as needed for rhinitis. Use in each nostril as directed   BD PEN NEEDLE NANO U/F 32G X 4 MM Misc Generic drug:  Insulin Pen Needle USE ONCE DAILY   carvedilol 12.5 MG tablet Commonly known as:  COREG Take 1 tablet (12.5 mg total) by mouth 2 (two) times daily with a meal.   fenofibrate 160 MG tablet TAKE 1 TABLET DAILY   fexofenadine 180 MG tablet Commonly known as:  ALLEGRA Take 180 mg by mouth daily.  furosemide 40 MG tablet Commonly known as:  LASIX Take 120 mg by mouth daily.   gabapentin 300 MG capsule Commonly known as:  NEURONTIN Take 1 capsule (300 mg total) by mouth 3 (three) times daily.   glucose blood test strip Check 1 time daily. E11.9 One Touch Ultra Blue Test Strips   HYDROcodone-acetaminophen 5-325 MG tablet Commonly known as:  NORCO/VICODIN Take 1 tablet by mouth every 6 (six) hours as needed for moderate pain.   Insulin Glargine 100 UNIT/ML Solostar Pen Commonly known as:  LANTUS SOLOSTAR Inject 50 Units into the skin daily at 10 pm. What changed:  how much to take  when to take this  additional instructions   insulin lispro 100 UNIT/ML injection Commonly known as:  HUMALOG Inject 0-14 Units into the skin 3 (three) times daily before meals. Dose per sliding scale   lanthanum 1000 MG chewable tablet Commonly known as:  FOSRENOL Chew 1,000 mg by mouth 3 (three) times daily with meals. Take  one tablet after each meal.   methocarbamol 500 MG tablet Commonly known as:  ROBAXIN Take 1 tablet (500 mg total) by mouth 3 (three) times daily.   multivitamin Tabs tablet Take 1 tablet by mouth daily.   nystatin cream Commonly known as:  MYCOSTATIN Apply 1 application topically 2 (two) times daily.   oxyCODONE-acetaminophen 5-325 MG tablet Commonly known as:  ROXICET Take 1 tablet by mouth every 4 (four) hours as needed for severe pain.   PATADAY 0.2 % Soln Generic drug:  Olopatadine HCl Place 1 drop into both eyes daily as needed (for allergies).   polyethylene glycol packet Commonly known as:  MIRALAX / GLYCOLAX Take 17 g by mouth daily as needed. What changed:  reasons to take this       Diagnostic Studies: Dg Chest 2 View  Result Date: 04/13/2016 Chest Xray & Lexiscan Myoview scheduled for 04/21/16 At [facility]. Please arrive at 7am. Patient notified.   Nm Myocar Multi W/spect W/wall Motion / Ef  Result Date: 04/13/2016 Chest Xray & Lexiscan Myoview scheduled for 04/21/16 At [facility]. Please arrive at 7am. Patient notified.   Dg Ang/ext/uni/or Right  Result Date: 03/31/2016 CLINICAL DATA:  56 year old male. Intraoperative angiogram of right lower extremity EXAM: RIGHT ANG/EXT/UNI/ OR CONTRAST:  Op note FLUOROSCOPY TIME:  Op note COMPARISON:  None FINDINGS: Limited intraoperative images of angiogram of right lower extremity. Single image demonstrates patent distal bypass appearing to be of vein bypass, with opacification of the distal superficial femoral artery and popliteal artery. Segments of the femoral popliteal system are obstructed by the arthroplasty hardware. The anastomosis is not visualized. Visualized tibial vessels are patent. IMPRESSION: Limited intraoperative right lower extremity angiogram as a completion study for bypass, as above. Please refer to the dictated operative report for full details of intraoperative findings and procedure. Signed, Dulcy Fanny. Earleen Newport, DO [facility] Electronically Signed   By: Corrie Mckusick D.O.   On: 03/31/2016 12:51   Dg Foot Complete Right  Result Date: 03/30/2016 CLINICAL DATA:  Status post amputation of right fifth toe. Right foot infection. Initial encounter. EXAM: RIGHT FOOT COMPLETE - 3+ VIEW COMPARISON:  Right foot radiographs performed 02/20/2016, and right foot CT performed 02/21/2016 FINDINGS: The patient is status post amputation of the fifth proximal phalanx. Overlying soft tissue swelling is noted. Mild soft tissue air is seen at the site of amputation. Would correlate for any evidence of infection with a gas producing organism.  Diffuse vascular calcifications are seen. A small plantar calcaneal spur is seen. IMPRESSION: 1. Status post amputation at the fifth proximal phalanx. Soft tissue swelling noted, with mild soft tissue air. Would correlate for any evidence of infection with a gas producing organism. 2. Diffuse vascular calcifications seen. Electronically Signed   By: Garald Balding M.D.   On: 03/30/2016 02:44    Patient benefited maximally from their hospital stay and there were no complications.     Disposition: 06-Home-Health Care Svc Discharge Instructions    Call MD / Call 911    Complete by:  As directed    If you experience chest pain or shortness of breath, CALL 911 and be transported to the hospital emergency room.  If you develope a fever above 101 F, pus (white drainage) or increased drainage or redness at the wound, or calf pain, call your surgeon's office.   Constipation Prevention    Complete by:  As directed    Drink plenty of fluids.  Prune juice may be helpful.  You may use a stool softener, such as Colace (over the counter) 100 mg twice a day.  Use MiraLax (over the counter) for constipation as needed.   Diet - low sodium heart healthy    Complete by:  As directed    Increase activity slowly as tolerated    Complete by:   As directed    Negative Pressure Wound Therapy - Incisional    Complete by:  As directed    Remove wound VAC dressing if the VAC alarms or stops working     Follow-up [place], MD Follow up in 1 week(s).   Specialty:  Orthopedic Surgery Contact information: [address] [place] [address] [phone]            Signed: Newt Minion 04/28/2016, 6:43 AM

## 2016-04-29 ENCOUNTER — Encounter: Payer: Self-pay | Admitting: *Deleted

## 2016-04-29 ENCOUNTER — Other Ambulatory Visit: Payer: Self-pay | Admitting: Cardiology

## 2016-04-29 ENCOUNTER — Other Ambulatory Visit: Payer: Self-pay | Admitting: *Deleted

## 2016-04-29 ENCOUNTER — Telehealth: Payer: Self-pay | Admitting: Family Medicine

## 2016-04-29 MED ORDER — CARVEDILOL 12.5 MG PO TABS: 12.5000 mg | ORAL_TABLET | Freq: Two times a day (BID) | ORAL | 1 refills | Status: DC

## 2016-04-29 MED ORDER — ESOMEPRAZOLE MAGNESIUM 40 MG PO CPDR: 40.0000 mg | DELAYED_RELEASE_CAPSULE | Freq: Every day | ORAL | 3 refills | Status: DC

## 2016-04-29 MED ORDER — ATORVASTATIN CALCIUM 80 MG PO TABS: 80.0000 mg | ORAL_TABLET | Freq: Every day | ORAL | 1 refills | Status: DC

## 2016-04-29 NOTE — Telephone Encounter (Signed)
carvedilol (COREG) 12.5 MG tablet   [facility] [place] [place]  Out of medicaiton completely. Just got of [facility]

## 2016-04-29 NOTE — Telephone Encounter (Signed)
Medication sent to pharmacy  

## 2016-04-29 NOTE — Telephone Encounter (Signed)
° ° ° °  Pt request refill of the following:  atorvastatin (LIPITOR) 80 MG tablet  90 day supply   [facility]am[facility]:   [facility]

## 2016-05-02 NOTE — Progress Notes (Signed)
Stress test shows a possible old blockage on the bottom of the heart, no evidence of any new blockages. Clinically there is no indication for further testing at this time. If he proceeds with possible transplant consideration his transplant team may desire for a repeat cath, but unless they require it for that process there is no other indciation to perform  Carlyle Dolly MD

## 2016-05-03 ENCOUNTER — Encounter (INDEPENDENT_AMBULATORY_CARE_PROVIDER_SITE_OTHER): Payer: Self-pay | Admitting: Orthopedic Surgery

## 2016-05-03 ENCOUNTER — Ambulatory Visit (INDEPENDENT_AMBULATORY_CARE_PROVIDER_SITE_OTHER): Payer: BLUE CROSS/BLUE SHIELD | Admitting: Orthopedic Surgery

## 2016-05-03 VITALS — Ht 70.0 in | Wt 179.0 lb

## 2016-05-03 DIAGNOSIS — Z89511 Acquired absence of right leg below knee: Secondary | ICD-10-CM

## 2016-05-03 NOTE — Progress Notes (Signed)
Office Visit Note   Patient: Anthony Bullock           Date of Birth: [birth year]           MRN: [id number] Visit Date: 05/03/2016              Requested by: [name], MD [place], [place] [address] PCP: [name], MD  Chief Complaint  Patient presents with  . Right Leg - Routine Post Op    04/26/16 pt is s/p right BKA with prevena , day one vive shrinker and TXA      HPI: Patient is one week status post right transtibial amputation.  Patient underwent treatment with the prosthetic shrinker, incisional wound VAC, TXA.  Assessment & Plan: Visit Diagnoses:  1. Status post unilateral below knee amputation, right ([place])     Plan: Patient has worked with by taking the past is given a prescription for a shrinker sleeve and prosthesis for K three-level prosthesis on the right. Patient has the strength balance inability to use the cane three-level prosthesis and needs it to ambulate on uneven train up and down bleachers and in varying cadence.  Follow-Up Instructions: Return in about 2 weeks (around 05/17/2016).   Ortho Exam  Patient is alert, oriented, no adenopathy, well-dressed, normal affect, normal respiratory effort. Examination the incision is well approximated there are no gangrenous changes of very small amount of bleeding from the incision there is good healthy granulation tissue there is no dehiscence cellulitis no odor. Gauze and a shrinker was applied. Patient will start Dial soap cleansing knee extension exercises and wear the shrinker daily.  Imaging: No results found.  Labs: Lab Results  Component Value Date   HGBA1C 11.3 (H) 03/30/2016   HGBA1C 8.1 (H) 11/29/2015   HGBA1C 8.8 04/29/2015   ESRSEDRATE 94 (H) 03/30/2016   ESRSEDRATE 18 11/30/2012   CRP 16.1 (H) 03/30/2016   REPTSTATUS 04/05/2016 FINAL 03/31/2016   REPTSTATUS 04/04/2016 FINAL 03/31/2016   GRAMSTAIN  03/31/2016    RARE WBC PRESENT, PREDOMINANTLY PMN NO ORGANISMS  SEEN    CULT NO ANAEROBES ISOLATED 03/31/2016   CULT  03/31/2016    RARE STENOTROPHOMONAS MALTOPHILIA RARE ENTEROBACTER SPECIES    LABORGA STENOTROPHOMONAS MALTOPHILIA 03/31/2016   LABORGA ENTEROBACTER SPECIES 03/31/2016    Orders:  No orders of the defined types were placed in this encounter.  No orders of the defined types were placed in this encounter.    Procedures: No procedures performed  Clinical Data: No additional findings.  ROS:  All other systems negative, except as noted in the HPI. Review of Systems  Objective: Vital Signs: Ht _0  (1.778 m)   Wt 179 lb (81.2 kg)   BMI 25.68 kg/m   Specialty Comments:  No specialty comments available.  PMFS History: Patient Active Problem List   Diagnosis Date Noted  . Status post unilateral below knee amputation, right ([place]) 04/26/2016  . Cutaneous abscess of right foot 04/19/2016  . Subacute osteomyelitis, right ankle and foot ([place]) 04/19/2016  . Diabetic foot infection ([place])   . Diabetic foot ulcer ([place]) 03/30/2016  . Bilateral carotid bruits 03/30/2016  . Diabetes mellitus with complication ([place])   . Anemia due to chronic kidney disease   . Amputated toe of right foot ([place]) 03/17/2016  . ESRD (end stage renal disease) on dialysis ([employer]) 11/30/2015  . Acute respiratory failure with hypoxia ([place]) 11/29/2015  . At high risk for falls 09/29/2014  . Peripheral vascular  disease ([place]) 09/29/2014  . Osteoarthritis of both knees 01/07/2014  . Osteoarthritis of multiple joints 10/06/2013  . NICM (nonischemic cardiomyopathy) ([place]) 06/27/2013  . CAD (coronary artery disease) 03/13/2013  . Abnormal nuclear stress test 01/27/2013  . Chronic systolic congestive heart failure ([place]) 01/27/2013  . COPD (chronic obstructive pulmonary disease) ([place]) 12/27/2012  . Diabetic polyneuropathy associated with type 2 diabetes mellitus ([place]) 02/10/2012  . PYELITIS/PYELONEPHRITIS DISEASES CLASSIFIED ELSW 09/28/2009  . URINARY  CALCULUS 09/28/2009  . Adjustment disorder with depressed mood 08/21/2009  . Poorly controlled type II diabetes mellitus with renal complication ([place]) 62/70/3500  . Hyperlipidemia 04/14/2008  . Essential hypertension 04/14/2008  . ALLERGIC RHINITIS 04/14/2008   Past Medical History:  Diagnosis Date  . 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 Allergic rhinitis   . Anemia   . Arthritis   . Chronic systolic heart failure ([place])    a. ECHO (12/2012) EF 25-30%, HK entireanteroseptal myocardium //  b.  EF 25%, diffuse HK, grade 1 diastolic dysfunction, MAC, mild LAE, normal RVSF, trivial pericardial effusion  . COPD (chronic obstructive pulmonary disease) ([place])   . Diabetes mellitus type II   . Diabetic nephropathy ([place])   . Diabetic neuropathy ([place])   . ESRD on hemodialysis [facility])    started HD June 2017, goes to [facility] HD unit, Dr Hinda Lenis  . History of cardiac catheterization    a.Myoview 1/15:  There is significant left ventricular dysfunction. There may be slight scar at the apex. There is no significant ischemia. LV Ejection Fraction: 27%  //  b. RHC/LHC (1/15) with mean RA 6, PA 47/22 mean 33, mean PCWP 20, PVR 2.5 WU, CI 2.5; 80% dLAD stenosis, 70% diffuse large D.   . History of kidney stones   . Hyperlipidemia   . Hypertension   . Kidney stones   . NICM (nonischemic cardiomyopathy) ([place])    Primarily nonischemic. Echo (12/14) with EF 25-30%. Echo (3/15) with EF 25%, mild to moderately dilated LV, normal RV size and systolic function.   . Pneumonia   . Urethral stricture     Family History  Problem Relation Age of Onset  . Bladder Cancer Mother   . Alcohol abuse Father   . Melanoma Father   . Stroke Maternal Grandmother   . Heart Problems Maternal Grandmother     unknown  . Diabetes Maternal Grandmother   . Heart disease Maternal Grandfather   . Prostate cancer Maternal Grandfather     Past Surgical History:  Procedure  Laterality Date  . ABDOMINAL AORTOGRAM W/LOWER EXTREMITY N/A 03/30/2016   Procedure: Abdominal Aortogram w/Lower Extremity;  Surgeon: Angelia Mould, MD;  Location: [place] CV [facility];  Service: Cardiovascular;  Laterality: N/A;  . AMPUTATION Right 04/26/2016   Procedure: Right Below Knee Amputation;  Surgeon: Newt Minion, MD;  Location: [place];  Service: Orthopedics;  Laterality: Right;  . AV FISTULA PLACEMENT Right 09/08/2015   Procedure: INSERTION OF 4-48m x 45cm  ARTERIOVENOUS (AV) GORE-TEX GRAFT RIGHT UPPER  ARM;  Surgeon: CAngelia Mould MD;  [place]  Service: Vascular;  Laterality: Right;  . AV FISTULA PLACEMENT Left 01/14/2016   Procedure: CREATION OF LEFT UPPER ARM ARTERIOVENOUS FISTULA;  Surgeon: CAngelia Mould MD;  [place]  Service: Vascular;  Laterality: Left;  . BASCILIC VEIN TRANSPOSITION Right 08/22/2014   Procedure: RIGHT UPPER ARM BASCILIC VEIN TRANSPOSITION;  Surgeon: CAngelia Mould MD;  [place]  Service:  Vascular;  Laterality: Right;  . BELOW KNEE LEG AMPUTATION Right 04/26/2016  . CARDIAC CATHETERIZATION    . CARDIAC DEFIBRILLATOR PLACEMENT  06/27/2013   Sub Q       BY DR Caryl Comes  . COLONOSCOPY WITH PROPOFOL N/A 07/22/2015   Procedure: COLONOSCOPY WITH PROPOFOL;  Surgeon: Doran Stabler, MD;  [facility];  Service: Gastroenterology;  Laterality: N/A;  . FEMORAL-POPLITEAL BYPASS GRAFT Right 03/31/2016   Procedure: BYPASS GRAFT FEMORAL-POPLITEAL ARTERY USING RIGHT GREATER SAPHENOUS NONREVERSED VEIN;  Surgeon: Angelia Mould, MD;  Location: [place];  Service: Vascular;  Laterality: Right;  . HERNIA REPAIR    . I&D EXTREMITY Right 03/31/2016   Procedure: IRRIGATION AND DEBRIDEMENT FOOT;  Surgeon: Angelia Mould, MD;  Location: [place];  Service: Vascular;  Laterality: Right;  . IMPLANTABLE CARDIOVERTER DEFIBRILLATOR IMPLANT N/A 06/27/2013   Procedure: SUB Q ICD;  Surgeon: Deboraha Sprang, MD;  Location: [facility] CATH  LAB;  Service: Cardiovascular;  Laterality: N/A;  . INTRAOPERATIVE ARTERIOGRAM Right 03/31/2016   Procedure: INTRA OPERATIVE ARTERIOGRAM;  Surgeon: Angelia Mould, MD;  Location: [place];  Service: Vascular;  Laterality: Right;  . IR GENERIC HISTORICAL Right 11/30/2015   IR THROMBECTOMY AV FISTULA W/THROMBOLYSIS/PTA INC/SHUNT/IMG RIGHT 11/30/2015 Aletta Edouard, MD [facility]  . IR GENERIC HISTORICAL  11/30/2015   IR US GUIDE VASC ACCESS RIGHT 11/30/2015 Aletta Edouard, MD [facility]  . IR GENERIC HISTORICAL Right 12/15/2015   IR THROMBECTOMY AV FISTULA W/THROMBOLYSIS/PTA/STENT INC/SHUNT/IMG RT 12/15/2015 Arne Cleveland, MD [facility]  . IR GENERIC HISTORICAL  12/15/2015   IR US GUIDE VASC ACCESS RIGHT 12/15/2015 Arne Cleveland, MD [facility]  . IR GENERIC HISTORICAL  12/28/2015   IR FLUORO GUIDE CV LINE RIGHT 12/28/2015 Marybelle Killings, MD [facility]  . IR GENERIC HISTORICAL  12/28/2015   IR US GUIDE VASC ACCESS RIGHT 12/28/2015 Marybelle Killings, MD [facility]  . LEFT A ND RIGHT HEART CATH  01/30/2013   DR Sung Amabile  . LEFT AND RIGHT HEART CATHETERIZATION WITH CORONARY ANGIOGRAM N/A 01/30/2013   Procedure: LEFT AND RIGHT HEART CATHETERIZATION WITH CORONARY ANGIOGRAM;  Surgeon: Jolaine Artist, MD;  Location: [facility] CATH LAB;  Service: Cardiovascular;  Laterality: N/A;  . PERIPHERAL VASCULAR CATHETERIZATION Right 01/26/2015   Procedure: A/V Fistulagram;  Surgeon: Angelia Mould, MD;  Location: [place] [facility];  Service: Cardiovascular;  Laterality: Right;  . reapea urethral surgery for recurrent obstruction  2011  . TOTAL KNEE ARTHROPLASTY Right 2007  . VEIN HARVEST Right 03/31/2016   Procedure: RIGHT GREATER SAPHENOUS VEIN HARVEST;  Surgeon: Angelia Mould, MD;  Location: [place];  Service: Vascular;  Laterality: Right;   Social History   Occupational History  . Not on file.   Social History Main Topics  . Smoking status: Former Smoker    Packs/day: 2.00     Years: 32.00    Types: Cigarettes    Quit date: 05/11/2010  . Smokeless tobacco: Never Used  . Alcohol use No  . Drug use: No  . Sexual activity: Yes

## 2016-05-04 ENCOUNTER — Ambulatory Visit (INDEPENDENT_AMBULATORY_CARE_PROVIDER_SITE_OTHER): Payer: Self-pay | Admitting: Vascular Surgery

## 2016-05-04 ENCOUNTER — Encounter: Payer: Self-pay | Admitting: Vascular Surgery

## 2016-05-04 ENCOUNTER — Ambulatory Visit (HOSPITAL_COMMUNITY)
Admission: RE | Admit: 2016-05-04 | Discharge: 2016-05-04 | Disposition: A | Discharge status: Home / Self Care | Payer: BLUE CROSS/BLUE SHIELD | Source: Ambulatory Visit | Attending: Vascular Surgery | Admitting: Vascular Surgery

## 2016-05-04 VITALS — BP 121/61 | HR 80 | Temp 98.1°F | Resp 16 | Ht 70.0 in | Wt 189.0 lb

## 2016-05-04 DIAGNOSIS — N186 End stage renal disease: Secondary | ICD-10-CM | POA: Diagnosis not present

## 2016-05-04 DIAGNOSIS — Z4931 Encounter for adequacy testing for hemodialysis: Secondary | ICD-10-CM | POA: Insufficient documentation

## 2016-05-04 DIAGNOSIS — N184 Chronic kidney disease, stage 4 (severe): Secondary | ICD-10-CM

## 2016-05-04 NOTE — Progress Notes (Signed)
Patient name: Anthony Bullock MRN: [id number] DOB: 06-02-1960 Sex: male  REASON FOR VISIT: Follow up of left brachiocephalic AV fistula.  HPI: Anthony Bullock is a 56 y.o. male who had a left brachiocephalic fistula placed on 01/14/2016.  I noted that the vein was 4 mm at the time of this procedure. He currently dialyzes with a right IJ tunneled dialysis catheter. The fistula has not been large enough for dialysis. He dialyzes on Monday Wednesdays and Fridays.   Of note I performed a right common femoral artery to below-knee popliteal artery bypass with a vein graft on 03/31/2016. Unfortunately, he had an abscess in his foot and Dr. Sharol Given performed a right below the knee amputation a week ago.  Current Outpatient Prescriptions  Medication Sig Dispense Refill  . aspirin EC 81 MG tablet Take 81 mg by mouth daily.     [name] atorvastatin (LIPITOR) 80 MG tablet Take 1 tablet (80 mg total) by mouth at bedtime. 90 tablet 1  . azelastine (ASTELIN) 0.1 % nasal spray Place 2 sprays into both nostrils 3 (three) times daily as needed for rhinitis. Use in each nostril as directed 30 mL 12  . BD PEN NEEDLE NANO U/F 32G X 4 MM MISC USE ONCE DAILY 90 each 2  . carvedilol (COREG) 12.5 MG tablet Take 1 tablet (12.5 mg total) by mouth 2 (two) times daily with a meal. 180 tablet 1  . fenofibrate 160 MG tablet TAKE 1 TABLET DAILY 90 tablet 3  . fexofenadine (ALLEGRA) 180 MG tablet Take 180 mg by mouth daily.    . furosemide (LASIX) 40 MG tablet Take 120 mg by mouth daily.    [name] gabapentin (NEURONTIN) 300 MG capsule Take 1 capsule (300 mg total) by mouth 3 (three) times daily. 270 capsule 2  . glucose blood test strip Check 1 time daily. E11.9 One Touch Ultra Blue Test Strips 100 each 3  . HYDROcodone-acetaminophen (NORCO/VICODIN) 5-325 MG tablet Take 1 tablet by mouth every 6 (six) hours as needed for moderate pain. 24 tablet 0  . Insulin Glargine (LANTUS SOLOSTAR) 100 UNIT/ML Solostar Pen Inject 50 Units into the skin  daily at 10 pm. (Patient taking differently: Inject 40 Units into the skin See admin instructions. Takes at bedtime on Sunday, Tuesday, Thursday, Saturday only. (Does not take on dialysis days)) 15 mL 11  . insulin lispro (HUMALOG) 100 UNIT/ML injection Inject 0-14 Units into the skin 3 (three) times daily before meals. Dose per sliding scale    . lanthanum (FOSRENOL) 1000 MG chewable tablet Chew 1,000 mg by mouth 3 (three) times daily with meals. Take one tablet after each meal.     . methocarbamol (ROBAXIN) 500 MG tablet Take 1 tablet (500 mg total) by mouth 3 (three) times daily. 30 tablet 0  . multivitamin (RENA-VIT) TABS tablet Take 1 tablet by mouth daily.  99  . nystatin cream (MYCOSTATIN) Apply 1 application topically 2 (two) times daily.  2  . Olopatadine HCl (PATADAY) 0.2 % SOLN Place 1 drop into both eyes daily as needed (for allergies).     [name] oxyCODONE-acetaminophen (ROXICET) 5-325 MG tablet Take 1 tablet by mouth every 4 (four) hours as needed for severe pain. 60 tablet 0  . polyethylene glycol (MIRALAX / GLYCOLAX) packet Take 17 g by mouth daily as needed. (Patient taking differently: Take 17 g by mouth daily as needed (constipation). ) 14 each 0   No current facility-administered medications for this visit.  REVIEW OF SYSTEMS:  [X]  denotes positive finding, [ ]  denotes negative finding Cardiac  Comments:  Chest pain or chest pressure:    Shortness of breath upon exertion:    Short of breath when lying flat:    Irregular heart rhythm:    Constitutional    Fever or chills:      PHYSICAL EXAM: Vitals:   05/04/16 1208  BP: 121/61  Pulse: 80  Resp: 16  Temp: 98.1 F (36.7 C)  TempSrc: Oral  SpO2: 96%  Weight: 189 lb (85.7 kg)  Height: 5\' 10"  (1.778 m)    GENERAL: The patient is a well-nourished male, in no acute distress. The vital signs are documented above. CARDIOVASCULAR: There is a regular rate and rhythm. PULMONARY: There is good air exchange bilaterally  without wheezing or ralesThis fistula has a good thrill all of the vein is somewhat small.  DUPLEX OF LEFT AV FISTULA: I have initially interpreted the duplex of the left AV fistula. Diameters range from 0.3-0.76 cm. There are no areas of stenosis noted no specific problems at then identified.  MEDICAL ISSUES:  POORLY MATURING LEFT BRACHIOCEPHALIC AV FISTULA:  I have recommended a fistulogram to further evaluate his fistula. He's been through this before and this was not successful and he is very discouraged. Currently he does not want to schedule a fistulogram and would like to give the fistula more time to mature. Given that no specific problems are identified by duplex I do not think this is reasonable. I'll plan on seeing him back in 2 months. He knows to call sooner if he has problems.  [name] [facility] [phone]

## 2016-05-05 ENCOUNTER — Encounter: Payer: Self-pay | Admitting: Podiatry

## 2016-05-05 NOTE — Addendum Note (Signed)
Addended by: Lianne Cure A on: 05/05/2016 08:19 AM   Modules accepted: Orders

## 2016-05-13 ENCOUNTER — Telehealth: Payer: Self-pay | Admitting: Family Medicine

## 2016-05-13 MED ORDER — INSULIN DETEMIR 100 UNIT/ML FLEXPEN: 44.0000 [IU] | PEN_INJECTOR | Freq: Every day | SUBCUTANEOUS | 11 refills | Status: DC

## 2016-05-13 NOTE — Telephone Encounter (Signed)
Called and spoke with pharmacy. Insurance prefers Administrator, sports.

## 2016-05-13 NOTE — Telephone Encounter (Signed)
° ° ° ° °  Do you know if you have a PA for the below med  Insulin Glargine (LANTUS SOLOSTAR) 100 UNIT/ML Solostar Pen

## 2016-05-13 NOTE — Telephone Encounter (Signed)
OK to switch to Levemir.

## 2016-05-14 DIAGNOSIS — M9080 Osteopathy in diseases classified elsewhere, unspecified site: Secondary | ICD-10-CM | POA: Insufficient documentation

## 2016-05-17 ENCOUNTER — Ambulatory Visit (INDEPENDENT_AMBULATORY_CARE_PROVIDER_SITE_OTHER): Payer: BLUE CROSS/BLUE SHIELD | Admitting: Orthopedic Surgery

## 2016-05-17 ENCOUNTER — Encounter (INDEPENDENT_AMBULATORY_CARE_PROVIDER_SITE_OTHER): Payer: Self-pay | Admitting: Orthopedic Surgery

## 2016-05-17 VITALS — Ht 70.0 in | Wt 189.0 lb

## 2016-05-17 DIAGNOSIS — Z89511 Acquired absence of right leg below knee: Secondary | ICD-10-CM

## 2016-05-17 NOTE — Progress Notes (Signed)
Office Visit Note   Patient: Anthony Bullock           Date of Birth: Mar 10, 1960           MRN: [id number] Visit Date: 05/17/2016              Requested by: [name], MD [place], [place] [address] PCP: [name], MD  Chief Complaint  Patient presents with  . Right Leg - Routine Post Op    04/26/16 R BKA 3 weeks post op      HPI: Patient is 3 weeks status post right transtibial amputation he has no complaints no pain will follow-up with [employer] for a smaller shrinker and prosthetic treatment  Assessment & Plan: Visit Diagnoses:  1. Status post unilateral below knee amputation, right ([place])     Plan: Follow up with [employer] next week advanced to a smaller stump shrinker store scar massage with moisturizing lotion where the stump shrinker around-the-clock.  Follow-Up Instructions: Return in about 4 weeks (around 06/14/2016).   Ortho Exam  Patient is alert, oriented, no adenopathy, well-dressed, normal affect, normal respiratory effort. Examination incision is well-healed there is no drainage no cellulitis no signs of infection. Patient is very minimal swelling. At 3 weeks out he has shown remarkable improvement.  Imaging: No results found.  Labs: Lab Results  Component Value Date   HGBA1C 11.3 (H) 03/30/2016   HGBA1C 8.1 (H) 11/29/2015   HGBA1C 8.8 04/29/2015   ESRSEDRATE 94 (H) 03/30/2016   ESRSEDRATE 18 11/30/2012   CRP 16.1 (H) 03/30/2016   REPTSTATUS 04/05/2016 FINAL 03/31/2016   REPTSTATUS 04/04/2016 FINAL 03/31/2016   GRAMSTAIN  03/31/2016    RARE WBC PRESENT, PREDOMINANTLY PMN NO ORGANISMS SEEN    CULT NO ANAEROBES ISOLATED 03/31/2016   CULT  03/31/2016    RARE STENOTROPHOMONAS MALTOPHILIA RARE ENTEROBACTER SPECIES    LABORGA STENOTROPHOMONAS MALTOPHILIA 03/31/2016   LABORGA ENTEROBACTER SPECIES 03/31/2016    Orders:  No orders of the defined types were placed in this encounter.  No orders of the defined types were  placed in this encounter.    Procedures: No procedures performed  Clinical Data: No additional findings.  ROS:  All other systems negative, except as noted in the HPI. Review of Systems  Objective: Vital Signs: Ht _0  (1.778 m)   Wt 189 lb (85.7 kg)   BMI 27.12 kg/m   Specialty Comments:  No specialty comments available.  PMFS History: Patient Active Problem List   Diagnosis Date Noted  . Status post unilateral below knee amputation, right ([place]) 04/26/2016  . Cutaneous abscess of right foot 04/19/2016  . Subacute osteomyelitis, right ankle and foot ([place]) 04/19/2016  . Diabetic foot infection ([place])   . Diabetic foot ulcer ([place]) 03/30/2016  . Bilateral carotid bruits 03/30/2016  . Diabetes mellitus with complication ([place])   . Anemia due to chronic kidney disease   . Amputated toe of right foot ([place]) 03/17/2016  . ESRD (end stage renal disease) on dialysis ([place]) 11/30/2015  . Acute respiratory failure with hypoxia ([place]) 11/29/2015  . At high risk for falls 09/29/2014  . Peripheral vascular disease ([place]) 09/29/2014  . Osteoarthritis of both knees 01/07/2014  . Osteoarthritis of multiple joints 10/06/2013  . NICM (nonischemic cardiomyopathy) ([place]) 06/27/2013  . CAD (coronary artery disease) 03/13/2013  . Abnormal nuclear stress test 01/27/2013  . Chronic systolic congestive heart failure ([place]) 01/27/2013  . COPD (chronic obstructive pulmonary disease) ([place]) 12/27/2012  .  Diabetic polyneuropathy associated with type 2 diabetes mellitus ([place]) 02/10/2012  . PYELITIS/PYELONEPHRITIS DISEASES CLASSIFIED ELSW 09/28/2009  . URINARY CALCULUS 09/28/2009  . Adjustment disorder with depressed mood 08/21/2009  . Poorly controlled type II diabetes mellitus with renal complication ([place]) 73/71/0626  . Hyperlipidemia 04/14/2008  . Essential hypertension 04/14/2008  . ALLERGIC RHINITIS 04/14/2008   Past Medical History:  Diagnosis Date  . AICD (automatic  cardioverter/defibrillator) present    [employer]  . Allergic rhinitis   . Anemia   . Arthritis   . Chronic systolic heart failure ([place])    a. ECHO (12/2012) EF 25-30%, HK entireanteroseptal myocardium //  b.  EF 25%, diffuse HK, grade 1 diastolic dysfunction, MAC, mild LAE, normal RVSF, trivial pericardial effusion  . COPD (chronic obstructive pulmonary disease) ([place])   . Diabetes mellitus type II   . Diabetic nephropathy ([place])   . Diabetic neuropathy ([place])   . ESRD on hemodialysis [facility])    started HD June 2017, goes to [facility] unit, Dr Hinda Lenis  . History of cardiac catheterization    a.Myoview 1/15:  There is significant left ventricular dysfunction. There may be slight scar at the apex. There is no significant ischemia. LV Ejection Fraction: 27%  //  b. RHC/LHC (1/15) with mean RA 6, PA 47/22 mean 33, mean PCWP 20, PVR 2.5 WU, CI 2.5; 80% dLAD stenosis, 70% diffuse large D.   . History of kidney stones   . Hyperlipidemia   . Hypertension   . Kidney stones   . NICM (nonischemic cardiomyopathy) ([facility])    Primarily nonischemic. Echo (12/14) with EF 25-30%. Echo (3/15) with EF 25%, mild to moderately dilated LV, normal RV size and systolic function.   . Pneumonia   . Urethral stricture     Family History  Problem Relation Age of Onset  . Bladder Cancer Mother   . Alcohol abuse Father   . Melanoma Father   . Stroke Maternal Grandmother   . Heart Problems Maternal Grandmother     unknown  . Diabetes Maternal Grandmother   . Heart disease Maternal Grandfather   . Prostate cancer Maternal Grandfather     Past Surgical History:  Procedure Laterality Date  . ABDOMINAL AORTOGRAM W/LOWER EXTREMITY N/A 03/30/2016   Procedure: Abdominal Aortogram w/Lower Extremity;  Surgeon: Angelia Mould, MD;  Location: [place] [facility];  Service: Cardiovascular;  Laterality: N/A;  . AMPUTATION Right 04/26/2016   Procedure: Right Below Knee Amputation;  Surgeon: Newt Minion,  MD;  Location: [place];  Service: Orthopedics;  Laterality: Right;  . AV FISTULA PLACEMENT Right 09/08/2015   Procedure: INSERTION OF 4-11m x 45cm  ARTERIOVENOUS (AV) GORE-TEX GRAFT RIGHT UPPER  ARM;  Surgeon: CAngelia Mould MD;  [place]  Service: Vascular;  Laterality: Right;  . AV FISTULA PLACEMENT Left 01/14/2016   Procedure: CREATION OF LEFT UPPER ARM ARTERIOVENOUS FISTULA;  Surgeon: CAngelia Mould MD;  [place]  Service: Vascular;  Laterality: Left;  . BASCILIC VEIN TRANSPOSITION Right 08/22/2014   Procedure: RIGHT UPPER ARM BASCILIC VEIN TRANSPOSITION;  Surgeon: CAngelia Mould MD;  Location: [place]  Service: Vascular;  Laterality: Right;  . BELOW KNEE LEG AMPUTATION Right 04/26/2016  . CARDIAC CATHETERIZATION    . CARDIAC DEFIBRILLATOR PLACEMENT  06/27/2013   Sub Q       BY DR KCaryl Comes . COLONOSCOPY WITH PROPOFOL N/A 07/22/2015   Procedure: COLONOSCOPY WITH PROPOFOL;  Surgeon: HDoran Stabler MD;  [facility];  Service: Gastroenterology;  Laterality: N/A;  . FEMORAL-POPLITEAL BYPASS GRAFT Right 03/31/2016   Procedure: BYPASS GRAFT FEMORAL-POPLITEAL ARTERY USING RIGHT GREATER SAPHENOUS NONREVERSED VEIN;  Surgeon: Angelia Mould, MD;  Location: [place];  Service: Vascular;  Laterality: Right;  . HERNIA REPAIR    . I&D EXTREMITY Right 03/31/2016   Procedure: IRRIGATION AND DEBRIDEMENT FOOT;  Surgeon: Angelia Mould, MD;  Location: [place];  Service: Vascular;  Laterality: Right;  . IMPLANTABLE CARDIOVERTER DEFIBRILLATOR IMPLANT N/A 06/27/2013   Procedure: SUB Q ICD;  Surgeon: Deboraha Sprang, MD;  Location: [facility] CATH LAB;  Service: Cardiovascular;  Laterality: N/A;  . INTRAOPERATIVE ARTERIOGRAM Right 03/31/2016   Procedure: INTRA OPERATIVE ARTERIOGRAM;  Surgeon: Angelia Mould, MD;  Location: [place];  Service: Vascular;  Laterality: Right;  . IR GENERIC HISTORICAL Right 11/30/2015   IR THROMBECTOMY AV FISTULA W/THROMBOLYSIS/PTA  INC/SHUNT/IMG RIGHT 11/30/2015 Aletta Edouard, MD [facility]  . IR GENERIC HISTORICAL  11/30/2015   IR US GUIDE VASC ACCESS RIGHT 11/30/2015 Aletta Edouard, MD [facility]  . IR GENERIC HISTORICAL Right 12/15/2015   IR THROMBECTOMY AV FISTULA W/THROMBOLYSIS/PTA/STENT INC/SHUNT/IMG RT 12/15/2015 Arne Cleveland, MD [facility]  . IR GENERIC HISTORICAL  12/15/2015   IR US GUIDE VASC ACCESS RIGHT 12/15/2015 Arne Cleveland, MD [facility]  . IR GENERIC HISTORICAL  12/28/2015   IR FLUORO GUIDE CV LINE RIGHT 12/28/2015 Marybelle Killings, MD [facility]  . IR GENERIC HISTORICAL  12/28/2015   IR US GUIDE VASC ACCESS RIGHT 12/28/2015 Marybelle Killings, MD [facility]  . LEFT A ND RIGHT HEART CATH  01/30/2013   DR Sung Amabile  . LEFT AND RIGHT HEART CATHETERIZATION WITH CORONARY ANGIOGRAM N/A 01/30/2013   Procedure: LEFT AND RIGHT HEART CATHETERIZATION WITH CORONARY ANGIOGRAM;  Surgeon: Jolaine Artist, MD;  Location: [facility] C[facility] LAB;  Service: Cardiovascular;  Laterality: N/A;  . PERIPHERAL VASCULAR CATHETERIZATION Right 01/26/2015   Procedure: A/V Fistulagram;  Surgeon: Angelia Mould, MD;  Location: [place] [facility];  Service: Cardiovascular;  Laterality: Right;  . reapea urethral surgery for recurrent obstruction  2011  . TOTAL KNEE ARTHROPLASTY Right 2007  . VEIN HARVEST Right 03/31/2016   Procedure: RIGHT GREATER SAPHENOUS VEIN HARVEST;  Surgeon: Angelia Mould, MD;  Location: [place];  Service: Vascular;  Laterality: Right;   Social History   Occupational History  . Not on file.   Social History Main Topics  . Smoking status: Former Smoker    Packs/day: 2.00    Years: 32.00    Types: Cigarettes    Quit date: 05/11/2010  . Smokeless tobacco: Never Used  . Alcohol use No  . Drug use: No  . Sexual activity: Yes

## 2016-05-23 ENCOUNTER — Encounter (INDEPENDENT_AMBULATORY_CARE_PROVIDER_SITE_OTHER): Payer: Self-pay | Admitting: Orthopedic Surgery

## 2016-05-23 ENCOUNTER — Ambulatory Visit (INDEPENDENT_AMBULATORY_CARE_PROVIDER_SITE_OTHER): Payer: BLUE CROSS/BLUE SHIELD | Admitting: Family

## 2016-05-23 VITALS — Ht 70.0 in | Wt 189.0 lb

## 2016-05-23 DIAGNOSIS — Z89421 Acquired absence of other right toe(s): Secondary | ICD-10-CM

## 2016-05-23 DIAGNOSIS — E118 Type 2 diabetes mellitus with unspecified complications: Secondary | ICD-10-CM

## 2016-05-23 DIAGNOSIS — Z89511 Acquired absence of right leg below knee: Secondary | ICD-10-CM

## 2016-05-23 DIAGNOSIS — S98131A Complete traumatic amputation of one right lesser toe, initial encounter: Secondary | ICD-10-CM

## 2016-05-23 NOTE — Progress Notes (Signed)
Post-Op Visit Note   Patient: Anthony Bullock           Date of Birth: 06-Aug-1960           MRN: [id number] Visit Date: 05/23/2016 PCP: [name], MD  Chief Complaint:  Chief Complaint  Patient presents with  . Right Leg - Wound Check    04/26/16 Rt BKA 27 days post op. Status post fall 05/20/16    HPI:  The patient is a 56 year old gentleman who presents today for concern of dehiscence to his right below the knee amputation. He had surgery on April 17. States 3 days ago his left leg gave way causing him to fall and land directly on his right residual limb. Complaining of swelling minimal bloody drainage. Has been doing Neosporin dressing changes. He has also been elevating. Unable to tolerate the shrinker this weekend due to pain.    Ortho Exam Incision is healing well. The area is centrally that has opened up is only 1 mm deep filled in with granulation tissue this is 3 cm x 5 mm. There issurrounding erythema no drainage no odor no sign of infection. There is one staple that was harvested from the lateral aspect of the incision.   Visit Diagnoses:  1. Status post unilateral below knee amputation, right ([place])   2. Amputated toe of right foot ([place])   3. Diabetes mellitus with complication (HCC)     Plan: A fracture ointment and a dressing were applied. Ace wrap as well. Encouraged him to resume the shrinker as he is able to tolerate it for compression. He'll follow-up in office as scheduled in about 4 weeks.  Follow-Up Instructions: Return in about 4 weeks (around 06/20/2016).   Imaging: No results found.  Orders:  No orders of the defined types were placed in this encounter.  No orders of the defined types were placed in this encounter.    PMFS History: Patient Active Problem List   Diagnosis Date Noted  . Status post unilateral below knee amputation, right ([place]) 04/26/2016  . Cutaneous abscess of right foot 04/19/2016  . Diabetic foot infection ([place])   .  Diabetic foot ulcer ([place]) 03/30/2016  . Bilateral carotid bruits 03/30/2016  . Diabetes mellitus with complication ([place])   . Anemia due to chronic kidney disease   . ESRD (end stage renal disease) on dialysis ([place]) 11/30/2015  . Acute respiratory failure with hypoxia ([place]) 11/29/2015  . At high risk for falls 09/29/2014  . Peripheral vascular disease ([place]) 09/29/2014  . Osteoarthritis of both knees 01/07/2014  . Osteoarthritis of multiple joints 10/06/2013  . NICM (nonischemic cardiomyopathy) ([place]) 06/27/2013  . CAD (coronary artery disease) 03/13/2013  . Abnormal nuclear stress test 01/27/2013  . Chronic systolic congestive heart failure ([place]) 01/27/2013  . COPD (chronic obstructive pulmonary disease) ([place]) 12/27/2012  . Diabetic polyneuropathy associated with type 2 diabetes mellitus ([place]) 02/10/2012  . PYELITIS/PYELONEPHRITIS DISEASES CLASSIFIED ELSW 09/28/2009  . URINARY CALCULUS 09/28/2009  . Adjustment disorder with depressed mood 08/21/2009  . Poorly controlled type II diabetes mellitus with renal complication ([place]) 40/98/1191  . Hyperlipidemia 04/14/2008  . Essential hypertension 04/14/2008  . ALLERGIC RHINITIS 04/14/2008   Past Medical History:  Diagnosis Date  . 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 Allergic rhinitis   . Anemia   . Arthritis   . Chronic systolic heart failure ([place])    a. ECHO (12/2012) EF 25-30%, HK entireanteroseptal myocardium //  b.  EF 25%, diffuse HK, grade 1 diastolic dysfunction, MAC, mild LAE, normal RVSF, trivial pericardial effusion  . COPD (chronic obstructive pulmonary disease) ([place])   . Diabetes mellitus type II   . Diabetic nephropathy ([place])   . Diabetic neuropathy ([place])   . ESRD on hemodialysis [facility])    started HD June 2017, goes to [facility] HD unit, Dr Hinda Lenis  . History of cardiac catheterization    a.Myoview 1/15:  There is significant left ventricular dysfunction. There may be slight scar  at the apex. There is no significant ischemia. LV Ejection Fraction: 27%  //  b. RHC/LHC (1/15) with mean RA 6, PA 47/22 mean 33, mean PCWP 20, PVR 2.5 WU, CI 2.5; 80% dLAD stenosis, 70% diffuse large D.   . History of kidney stones   . Hyperlipidemia   . Hypertension   . Kidney stones   . NICM (nonischemic cardiomyopathy) ([place])    Primarily nonischemic. Echo (12/14) with EF 25-30%. Echo (3/15) with EF 25%, mild to moderately dilated LV, normal RV size and systolic function.   . Pneumonia   . Urethral stricture     Family History  Problem Relation Age of Onset  . Bladder Cancer Mother   . Alcohol abuse Father   . Melanoma Father   . Stroke Maternal Grandmother   . Heart Problems Maternal Grandmother        unknown  . Diabetes Maternal Grandmother   . Heart disease Maternal Grandfather   . Prostate cancer Maternal Grandfather     Past Surgical History:  Procedure Laterality Date  . ABDOMINAL AORTOGRAM W/LOWER EXTREMITY N/A 03/30/2016   Procedure: Abdominal Aortogram w/Lower Extremity;  Surgeon: Angelia Mould, MD;  Location: [place] [facility];  Service: Cardiovascular;  Laterality: N/A;  . AMPUTATION Right 04/26/2016   Procedure: Right Below Knee Amputation;  Surgeon: Newt Minion, MD;  Location: [place];  Service: Orthopedics;  Laterality: Right;  . AV FISTULA PLACEMENT Right 09/08/2015   Procedure: INSERTION OF 4-38m x 45cm  ARTERIOVENOUS (AV) GORE-TEX GRAFT RIGHT UPPER  ARM;  Surgeon: CAngelia Mould MD;  Location: [place]  Service: Vascular;  Laterality: Right;  . AV FISTULA PLACEMENT Left 01/14/2016   Procedure: CREATION OF LEFT UPPER ARM ARTERIOVENOUS FISTULA;  Surgeon: CAngelia Mould MD;  [place]  Service: Vascular;  Laterality: Left;  . BASCILIC VEIN TRANSPOSITION Right 08/22/2014   Procedure: RIGHT UPPER ARM BASCILIC VEIN TRANSPOSITION;  Surgeon: CAngelia Mould MD;  [place]  Service: Vascular;  Laterality: Right;  . BELOW KNEE LEG  AMPUTATION Right 04/26/2016  . CARDIAC CATHETERIZATION    . CARDIAC DEFIBRILLATOR PLACEMENT  06/27/2013   Sub Q       BY DR KCaryl Comes . COLONOSCOPY WITH PROPOFOL N/A 07/22/2015   Procedure: COLONOSCOPY WITH PROPOFOL;  Surgeon: HDoran Stabler MD;  [facility];  Service: Gastroenterology;  Laterality: N/A;  . FEMORAL-POPLITEAL BYPASS GRAFT Right 03/31/2016   Procedure: BYPASS GRAFT FEMORAL-POPLITEAL ARTERY USING RIGHT GREATER SAPHENOUS NONREVERSED VEIN;  Surgeon: CAngelia Mould MD;  [place]  Service: Vascular;  Laterality: Right;  . HERNIA REPAIR    . I&D EXTREMITY Right 03/31/2016   Procedure: IRRIGATION AND DEBRIDEMENT FOOT;  Surgeon: CAngelia Mould MD;  Location[place]  Service: Vascular;  Laterality: Right;  . IMPLANTABLE CARDIOVERTER DEFIBRILLATOR IMPLANT N/A 06/27/2013   Procedure: SUB Q ICD;  Surgeon: SDeboraha Sprang MD;  [facility];  Service:  Cardiovascular;  Laterality: N/A;  . INTRAOPERATIVE ARTERIOGRAM Right 03/31/2016   Procedure: INTRA OPERATIVE ARTERIOGRAM;  Surgeon: Angelia Mould, MD;  Location: [place];  Service: Vascular;  Laterality: Right;  . IR GENERIC HISTORICAL Right 11/30/2015   IR THROMBECTOMY AV FISTULA W/THROMBOLYSIS/PTA INC/SHUNT/IMG RIGHT 11/30/2015 Aletta Edouard, MD [facility]  . IR GENERIC HISTORICAL  11/30/2015   IR US GUIDE VASC ACCESS RIGHT 11/30/2015 Aletta Edouard, MD [facility]  . IR GENERIC HISTORICAL Right 12/15/2015   IR THROMBECTOMY AV FISTULA W/THROMBOLYSIS/PTA/STENT INC/SHUNT/IMG RT 12/15/2015 Arne Cleveland, MD [facility]  . IR GENERIC HISTORICAL  12/15/2015   IR US GUIDE VASC ACCESS RIGHT 12/15/2015 Arne Cleveland, MD [facility]  . IR GENERIC HISTORICAL  12/28/2015   IR FLUORO GUIDE CV LINE RIGHT 12/28/2015 Marybelle Killings, MD [facility]  . IR GENERIC HISTORICAL  12/28/2015   IR US GUIDE VASC ACCESS RIGHT 12/28/2015 Marybelle Killings, MD [facility]  . LEFT A ND RIGHT HEART CATH  01/30/2013   DR  Sung Amabile  . LEFT AND RIGHT HEART CATHETERIZATION WITH CORONARY ANGIOGRAM N/A 01/30/2013   Procedure: LEFT AND RIGHT HEART CATHETERIZATION WITH CORONARY ANGIOGRAM;  Surgeon: Jolaine Artist, MD;  Location: [facility] CATH LAB;  Service: Cardiovascular;  Laterality: N/A;  . PERIPHERAL VASCULAR CATHETERIZATION Right 01/26/2015   Procedure: A/V Fistulagram;  Surgeon: Angelia Mould, MD;  Location: [place] [facility];  Service: Cardiovascular;  Laterality: Right;  . reapea urethral surgery for recurrent obstruction  2011  . TOTAL KNEE ARTHROPLASTY Right 2007  . VEIN HARVEST Right 03/31/2016   Procedure: RIGHT GREATER SAPHENOUS VEIN HARVEST;  Surgeon: Angelia Mould, MD;  Location: [place];  Service: Vascular;  Laterality: Right;   Social History   Occupational History  . Not on file.   Social History Main Topics  . Smoking status: Former Smoker    Packs/day: 2.00    Years: 32.00    Types: Cigarettes    Quit date: 05/11/2010  . Smokeless tobacco: Never Used  . Alcohol use No  . Drug use: No  . Sexual activity: Yes

## 2016-05-24 ENCOUNTER — Telehealth: Payer: Self-pay | Admitting: Family Medicine

## 2016-05-24 MED ORDER — NYSTATIN 100000 UNIT/GM EX CREA: 1.0000 "application " | TOPICAL_CREAM | Freq: Two times a day (BID) | CUTANEOUS | 2 refills | Status: DC

## 2016-05-24 MED ORDER — FENOFIBRATE 160 MG PO TABS: 160.0000 mg | ORAL_TABLET | Freq: Every day | ORAL | 1 refills | Status: DC

## 2016-05-24 MED ORDER — ATORVASTATIN CALCIUM 80 MG PO TABS: 80.0000 mg | ORAL_TABLET | Freq: Every day | ORAL | 1 refills | Status: DC

## 2016-05-24 NOTE — Telephone Encounter (Signed)
Pt needs new rx atorvastatin 80 mg, fenofibrate 160 mg  #90 /refills. Pt also needs myproicin cream sent to  [facility]

## 2016-05-24 NOTE — Telephone Encounter (Signed)
Rx sent 

## 2016-05-27 ENCOUNTER — Telehealth (INDEPENDENT_AMBULATORY_CARE_PROVIDER_SITE_OTHER): Payer: Self-pay | Admitting: Orthopedic Surgery

## 2016-05-27 NOTE — Telephone Encounter (Signed)
05/03/2016 OV NOTE FAXED TO Alison Stalling [phone]

## 2016-06-13 NOTE — Addendum Note (Signed)
Addendum  created 06/13/16 0948 by Oleta Mouse, MD   Sign clinical note

## 2016-06-14 ENCOUNTER — Encounter (INDEPENDENT_AMBULATORY_CARE_PROVIDER_SITE_OTHER): Payer: Self-pay | Admitting: Orthopedic Surgery

## 2016-06-14 ENCOUNTER — Ambulatory Visit (INDEPENDENT_AMBULATORY_CARE_PROVIDER_SITE_OTHER): Payer: BLUE CROSS/BLUE SHIELD | Admitting: Orthopedic Surgery

## 2016-06-14 VITALS — Ht 70.0 in | Wt 189.0 lb

## 2016-06-14 DIAGNOSIS — Z89511 Acquired absence of right leg below knee: Secondary | ICD-10-CM

## 2016-06-14 NOTE — Progress Notes (Signed)
Office Visit Note   Patient: Anthony Bullock           Date of Birth: 01/14/1960           MRN: [id number] Visit Date: 06/14/2016              Requested by: [name], MD [address], [place] [address] PCP: [name], MD  Chief Complaint  Patient presents with  . Right Leg - Routine Post Op    4/171/818 right BKA  . Left Foot - Injury    "blood blister popped up" possible friction in shoe      HPI: Patient is 2 months status post right transtibial amputation. 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 Patient is concerned that he developed a new blood blister on the left great toe.  Assessment & Plan: Visit Diagnoses:  1. Status post unilateral below knee amputation, right ([place])     Plan: Patient will follow up with [employer] the blood blisters superficial this is about 2 mm in diameter and is superficial. We will continue to monitor his left foot.  Follow-Up Instructions: Return in about 2 months (around 08/14/2016).   Ortho Exam  Patient is alert, oriented, no adenopathy, well-dressed, normal affect, normal respiratory effort. Examination he has excellent consolidation of the residual limb on the right lower extremity the left foot has no open ulcers is very superficial blood blister that is 2 mm in diameter tip of the left great toe  Imaging: No results found.  Labs: Lab Results  Component Value Date   HGBA1C 11.3 (H) 03/30/2016   HGBA1C 8.1 (H) 11/29/2015   HGBA1C 8.8 04/29/2015   ESRSEDRATE 94 (H) 03/30/2016   ESRSEDRATE 18 11/30/2012   CRP 16.1 (H) 03/30/2016   REPTSTATUS 04/05/2016 FINAL 03/31/2016   REPTSTATUS 04/04/2016 FINAL 03/31/2016   GRAMSTAIN  03/31/2016    RARE WBC PRESENT, PREDOMINANTLY PMN NO ORGANISMS SEEN    CULT NO ANAEROBES ISOLATED 03/31/2016   CULT  03/31/2016    RARE STENOTROPHOMONAS MALTOPHILIA RARE ENTEROBACTER SPECIES    LABORGA STENOTROPHOMONAS MALTOPHILIA 03/31/2016   LABORGA  ENTEROBACTER SPECIES 03/31/2016    Orders:  No orders of the defined types were placed in this encounter.  No orders of the defined types were placed in this encounter.    Procedures: No procedures performed  Clinical Data: No additional findings.  ROS:  All other systems negative, except as noted in the HPI. Review of Systems  Objective: Vital Signs: Ht _0  (1.778 m)   Wt 189 lb (85.7 kg)   BMI 27.12 kg/m   Specialty Comments:  No specialty comments available.  PMFS History: Patient Active Problem List   Diagnosis Date Noted  . Status post unilateral below knee amputation, right ([place]) 04/26/2016  . Cutaneous abscess of right foot 04/19/2016  . Diabetic foot infection ([place])   . Diabetic foot ulcer ([place]) 03/30/2016  . Bilateral carotid bruits 03/30/2016  . Diabetes mellitus with complication ([place])   . Anemia due to chronic kidney disease   . ESRD (end stage renal disease) on dialysis ([place]) 11/30/2015  . Acute respiratory failure with hypoxia ([place]) 11/29/2015  . At high risk for falls 09/29/2014  . Peripheral vascular disease ([place]) 09/29/2014  . Osteoarthritis of both knees 01/07/2014  . Osteoarthritis of multiple joints 10/06/2013  . NICM (nonischemic cardiomyopathy) ([place]) 06/27/2013  . CAD (coronary artery disease) 03/13/2013  . Abnormal nuclear stress test 01/27/2013  .  Chronic systolic congestive heart failure ([place]) 01/27/2013  . COPD (chronic obstructive pulmonary disease) ([place]) 12/27/2012  . Diabetic polyneuropathy associated with type 2 diabetes mellitus ([place]) 02/10/2012  . PYELITIS/PYELONEPHRITIS DISEASES CLASSIFIED ELSW 09/28/2009  . URINARY CALCULUS 09/28/2009  . Adjustment disorder with depressed mood 08/21/2009  . Poorly controlled type II diabetes mellitus with renal complication ([place]) 02/58/5277  . Hyperlipidemia 04/14/2008  . Essential hypertension 04/14/2008  . ALLERGIC RHINITIS 04/14/2008   Past Medical History:  Diagnosis Date  . AICD  (automatic cardioverter/defibrillator) present    boston scientific  . Allergic rhinitis   . Anemia   . Arthritis   . Chronic systolic heart failure ([place])    a. ECHO (12/2012) EF 25-30%, HK entireanteroseptal myocardium //  b.  EF 25%, diffuse HK, grade 1 diastolic dysfunction, MAC, mild LAE, normal RVSF, trivial pericardial effusion  . COPD (chronic obstructive pulmonary disease) ([place])   . Diabetes mellitus type II   . Diabetic nephropathy ([place])   . Diabetic neuropathy ([place])   . ESRD on hemodialysis [facility])    started HD June 2017, goes to [facility] HD unit, Dr Hinda Lenis  . History of cardiac catheterization    a.Myoview 1/15:  There is significant left ventricular dysfunction. There may be slight scar at the apex. There is no significant ischemia. LV Ejection Fraction: 27%  //  b. RHC/LHC (1/15) with mean RA 6, PA 47/22 mean 33, mean PCWP 20, PVR 2.5 WU, CI 2.5; 80% dLAD stenosis, 70% diffuse large D.   . History of kidney stones   . Hyperlipidemia   . Hypertension   . Kidney stones   . NICM (nonischemic cardiomyopathy) ([place])    Primarily nonischemic. Echo (12/14) with EF 25-30%. Echo (3/15) with EF 25%, mild to moderately dilated LV, normal RV size and systolic function.   . Pneumonia   . Urethral stricture     Family History  Problem Relation Age of Onset  . Bladder Cancer Mother   . Alcohol abuse Father   . Melanoma Father   . Stroke Maternal Grandmother   . Heart Problems Maternal Grandmother        unknown  . Diabetes Maternal Grandmother   . Heart disease Maternal Grandfather   . Prostate cancer Maternal Grandfather     Past Surgical History:  Procedure Laterality Date  . ABDOMINAL AORTOGRAM W/LOWER EXTREMITY N/A 03/30/2016   Procedure: Abdominal Aortogram w/Lower Extremity;  Surgeon: Angelia Mould, MD;  Location: [place] [facility];  Service: Cardiovascular;  Laterality: N/A;  . AMPUTATION Right 04/26/2016   Procedure: Right Below Knee Amputation;  Surgeon:  Newt Minion, MD;  Location: [place];  Service: Orthopedics;  Laterality: Right;  . AV FISTULA PLACEMENT Right 09/08/2015   Procedure: INSERTION OF 4-9m x 45cm  ARTERIOVENOUS (AV) GORE-TEX GRAFT RIGHT UPPER  ARM;  Surgeon: CAngelia Mould MD;  Location: [place]  Service: Vascular;  Laterality: Right;  . AV FISTULA PLACEMENT Left 01/14/2016   Procedure: CREATION OF LEFT UPPER ARM ARTERIOVENOUS FISTULA;  Surgeon: CAngelia Mould MD;  Location: [place]  Service: Vascular;  Laterality: Left;  . BASCILIC VEIN TRANSPOSITION Right 08/22/2014   Procedure: RIGHT UPPER ARM BASCILIC VEIN TRANSPOSITION;  Surgeon: CAngelia Mould MD;  Location: [place]  Service: Vascular;  Laterality: Right;  . BELOW KNEE LEG AMPUTATION Right 04/26/2016  . CARDIAC CATHETERIZATION    . CARDIAC DEFIBRILLATOR PLACEMENT  06/27/2013   Sub Q       BY DR  KLEIN  . COLONOSCOPY WITH PROPOFOL N/A 07/22/2015   Procedure: COLONOSCOPY WITH PROPOFOL;  Surgeon: Doran Stabler, MD;  [facility];  Service: Gastroenterology;  Laterality: N/A;  . FEMORAL-POPLITEAL BYPASS GRAFT Right 03/31/2016   Procedure: BYPASS GRAFT FEMORAL-POPLITEAL ARTERY USING RIGHT GREATER SAPHENOUS NONREVERSED VEIN;  Surgeon: Angelia Mould, MD;  Location: [place];  Service: Vascular;  Laterality: Right;  . HERNIA REPAIR    . I&D EXTREMITY Right 03/31/2016   Procedure: IRRIGATION AND DEBRIDEMENT FOOT;  Surgeon: Angelia Mould, MD;  Location: [place];  Service: Vascular;  Laterality: Right;  . IMPLANTABLE CARDIOVERTER DEFIBRILLATOR IMPLANT N/A 06/27/2013   Procedure: SUB Q ICD;  Surgeon: Deboraha Sprang, MD;  Location: [facility] C[facility] LAB;  Service: Cardiovascular;  Laterality: N/A;  . INTRAOPERATIVE ARTERIOGRAM Right 03/31/2016   Procedure: INTRA OPERATIVE ARTERIOGRAM;  Surgeon: Angelia Mould, MD;  Location: [place];  Service: Vascular;  Laterality: Right;  . IR GENERIC HISTORICAL Right 11/30/2015   IR THROMBECTOMY AV FISTULA  W/THROMBOLYSIS/PTA INC/SHUNT/IMG RIGHT 11/30/2015 Aletta Edouard, MD [facility]  . IR GENERIC HISTORICAL  11/30/2015   IR US GUIDE VASC ACCESS RIGHT 11/30/2015 Aletta Edouard, MD [facility]  . IR GENERIC HISTORICAL Right 12/15/2015   IR THROMBECTOMY AV FISTULA W/THROMBOLYSIS/PTA/STENT INC/SHUNT/IMG RT 12/15/2015 Arne Cleveland, MD [facility]  . IR GENERIC HISTORICAL  12/15/2015   IR US GUIDE VASC ACCESS RIGHT 12/15/2015 Arne Cleveland, MD [facility]  . IR GENERIC HISTORICAL  12/28/2015   IR FLUORO GUIDE CV LINE RIGHT 12/28/2015 Marybelle Killings, MD [facility]  . IR GENERIC HISTORICAL  12/28/2015   IR US GUIDE VASC ACCESS RIGHT 12/28/2015 Marybelle Killings, MD [facility]  . LEFT A ND RIGHT HEART CATH  01/30/2013   DR Sung Amabile  . LEFT AND RIGHT HEART CATHETERIZATION WITH CORONARY ANGIOGRAM N/A 01/30/2013   Procedure: LEFT AND RIGHT HEART CATHETERIZATION WITH CORONARY ANGIOGRAM;  Surgeon: Jolaine Artist, MD;  Location: [facility] CATH LAB;  Service: Cardiovascular;  Laterality: N/A;  . PERIPHERAL VASCULAR CATHETERIZATION Right 01/26/2015   Procedure: A/V Fistulagram;  Surgeon: Angelia Mould, MD;  Location: [place] [facility];  Service: Cardiovascular;  Laterality: Right;  . reapea urethral surgery for recurrent obstruction  2011  . TOTAL KNEE ARTHROPLASTY Right 2007  . VEIN HARVEST Right 03/31/2016   Procedure: RIGHT GREATER SAPHENOUS VEIN HARVEST;  Surgeon: Angelia Mould, MD;  Location: [place];  Service: Vascular;  Laterality: Right;   Social History   Occupational History  . Not on file.   Social History Main Topics  . Smoking status: Former Smoker    Packs/day: 2.00    Years: 32.00    Types: Cigarettes    Quit date: 05/11/2010  . Smokeless tobacco: Never Used  . Alcohol use No  . Drug use: No  . Sexual activity: Yes

## 2016-06-24 ENCOUNTER — Encounter: Payer: Self-pay | Admitting: Vascular Surgery

## 2016-06-28 ENCOUNTER — Encounter: Payer: Self-pay | Admitting: Family Medicine

## 2016-06-28 ENCOUNTER — Ambulatory Visit (INDEPENDENT_AMBULATORY_CARE_PROVIDER_SITE_OTHER): Payer: BLUE CROSS/BLUE SHIELD | Admitting: Family Medicine

## 2016-06-28 VITALS — BP 102/68 | HR 90 | Temp 99.0°F | Wt 181.0 lb

## 2016-06-28 DIAGNOSIS — R059 Cough, unspecified: Secondary | ICD-10-CM

## 2016-06-28 DIAGNOSIS — R05 Cough: Secondary | ICD-10-CM

## 2016-06-28 MED ORDER — LEVOFLOXACIN 250 MG PO TABS: ORAL_TABLET | ORAL | 0 refills | Status: DC

## 2016-06-28 NOTE — Patient Instructions (Signed)
Follow up for any fever or increased shortness of breath. 

## 2016-06-28 NOTE — Progress Notes (Signed)
Subjective:     Patient ID: Anthony Bullock, male   DOB: Aug 16, 1960, 56 y.o.   MRN: [id number]  HPI Patient seen with cough and low-grade fever. He states over week ago developed a sore throat and has had some productive cough since then. He's been taking over-the-counter plain Mucinex. Denies any nausea, vomiting, or diarrhea. He has multiple chronic problems including poorly controlled type 2 diabetes, peripheral vascular disease, nonischemic cardiomyopathy, dyslipidemia, hypertension, end-stage renal disease on dialysis and recent right below-knee amputation secondary to complicated infection. He gets dialysis 3 times per week.  Past Medical History:  Diagnosis Date  . 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 Allergic rhinitis   . Anemia   . Arthritis   . Chronic systolic heart failure ([place])    a. ECHO (12/2012) EF 25-30%, HK entireanteroseptal myocardium //  b.  EF 25%, diffuse HK, grade 1 diastolic dysfunction, MAC, mild LAE, normal RVSF, trivial pericardial effusion  . COPD (chronic obstructive pulmonary disease) ([place])   . Diabetes mellitus type II   . Diabetic nephropathy ([place])   . Diabetic neuropathy ([place])   . ESRD on hemodialysis [facility])    started HD June 2017, goes to [facility] HD unit, Dr Hinda Lenis  . History of cardiac catheterization    a.Myoview 1/15:  There is significant left ventricular dysfunction. There may be slight scar at the apex. There is no significant ischemia. LV Ejection Fraction: 27%  //  b. RHC/LHC (1/15) with mean RA 6, PA 47/22 mean 33, mean PCWP 20, PVR 2.5 WU, CI 2.5; 80% dLAD stenosis, 70% diffuse large D.   . History of kidney stones   . Hyperlipidemia   . Hypertension   . Kidney stones   . NICM (nonischemic cardiomyopathy) ([place])    Primarily nonischemic. Echo (12/14) with EF 25-30%. Echo (3/15) with EF 25%, mild to moderately dilated LV, normal RV size and systolic function.   . Pneumonia   . Urethral stricture     Past Surgical History:  Procedure Laterality Date  . ABDOMINAL AORTOGRAM W/LOWER EXTREMITY N/A 03/30/2016   Procedure: Abdominal Aortogram w/Lower Extremity;  Surgeon: Angelia Mould, MD;  Location: [place] [facility];  Service: Cardiovascular;  Laterality: N/A;  . AMPUTATION Right 04/26/2016   Procedure: Right Below Knee Amputation;  Surgeon: Newt Minion, MD;  Location: [place];  Service: Orthopedics;  Laterality: Right;  . AV FISTULA PLACEMENT Right 09/08/2015   Procedure: INSERTION OF 4-82m x 45cm  ARTERIOVENOUS (AV) GORE-TEX GRAFT RIGHT UPPER  ARM;  Surgeon: CAngelia Mould MD;  Location: [place]  Service: Vascular;  Laterality: Right;  . AV FISTULA PLACEMENT Left 01/14/2016   Procedure: CREATION OF LEFT UPPER ARM ARTERIOVENOUS FISTULA;  Surgeon: CAngelia Mould MD;  [place]  Service: Vascular;  Laterality: Left;  . BASCILIC VEIN TRANSPOSITION Right 08/22/2014   Procedure: RIGHT UPPER ARM BASCILIC VEIN TRANSPOSITION;  Surgeon: CAngelia Mould MD;  [place] [facility]  Service: Vascular;  Laterality: Right;  . BELOW KNEE LEG AMPUTATION Right 04/26/2016  . CARDIAC CATHETERIZATION    . CARDIAC DEFIBRILLATOR PLACEMENT  06/27/2013   Sub Q       BY DR KCaryl Comes . COLONOSCOPY WITH PROPOFOL N/A 07/22/2015   Procedure: COLONOSCOPY WITH PROPOFOL;  Surgeon: HDoran Stabler MD;  [facility];  Service: Gastroenterology;  Laterality: N/A;  . FEMORAL-POPLITEAL BYPASS GRAFT Right 03/31/2016   Procedure: BYPASS GRAFT FEMORAL-POPLITEAL ARTERY USING RIGHT GREATER  SAPHENOUS NONREVERSED VEIN;  Surgeon: Angelia Mould, MD;  Location: [place];  Service: Vascular;  Laterality: Right;  . HERNIA REPAIR    . I&D EXTREMITY Right 03/31/2016   Procedure: IRRIGATION AND DEBRIDEMENT FOOT;  Surgeon: Angelia Mould, MD;  Location: [place];  Service: Vascular;  Laterality: Right;  . IMPLANTABLE CARDIOVERTER DEFIBRILLATOR IMPLANT N/A 06/27/2013   Procedure: SUB Q ICD;  Surgeon:  Deboraha Sprang, MD;  Location: [facility] CATH LAB;  Service: Cardiovascular;  Laterality: N/A;  . INTRAOPERATIVE ARTERIOGRAM Right 03/31/2016   Procedure: INTRA OPERATIVE ARTERIOGRAM;  Surgeon: Angelia Mould, MD;  Location: [place];  Service: Vascular;  Laterality: Right;  . IR GENERIC HISTORICAL Right 11/30/2015   IR THROMBECTOMY AV FISTULA W/THROMBOLYSIS/PTA INC/SHUNT/IMG RIGHT 11/30/2015 Aletta Edouard, MD [facility]  . IR GENERIC HISTORICAL  11/30/2015   IR US GUIDE VASC ACCESS RIGHT 11/30/2015 Aletta Edouard, MD [facility]  . IR GENERIC HISTORICAL Right 12/15/2015   IR THROMBECTOMY AV FISTULA W/THROMBOLYSIS/PTA/STENT INC/SHUNT/IMG RT 12/15/2015 Arne Cleveland, MD [facility]  . IR GENERIC HISTORICAL  12/15/2015   IR US GUIDE VASC ACCESS RIGHT 12/15/2015 Arne Cleveland, MD [facility]  . IR GENERIC HISTORICAL  12/28/2015   IR FLUORO GUIDE CV LINE RIGHT 12/28/2015 Marybelle Killings, MD [facility]  . IR GENERIC HISTORICAL  12/28/2015   IR US GUIDE VASC ACCESS RIGHT 12/28/2015 Marybelle Killings, MD [facility]  . LEFT A ND RIGHT HEART CATH  01/30/2013   DR Sung Amabile  . LEFT AND RIGHT HEART CATHETERIZATION WITH CORONARY ANGIOGRAM N/A 01/30/2013   Procedure: LEFT AND RIGHT HEART CATHETERIZATION WITH CORONARY ANGIOGRAM;  Surgeon: Jolaine Artist, MD;  Location: [facility] CATH LAB;  Service: Cardiovascular;  Laterality: N/A;  . PERIPHERAL VASCULAR CATHETERIZATION Right 01/26/2015   Procedure: A/V Fistulagram;  Surgeon: Angelia Mould, MD;  Location: [place] [facility];  Service: Cardiovascular;  Laterality: Right;  . reapea urethral surgery for recurrent obstruction  2011  . TOTAL KNEE ARTHROPLASTY Right 2007  . VEIN HARVEST Right 03/31/2016   Procedure: RIGHT GREATER SAPHENOUS VEIN HARVEST;  Surgeon: Angelia Mould, MD;  Location: [place];  Service: Vascular;  Laterality: Right;    reports that he quit smoking about 6 years ago. His smoking use included Cigarettes. He has a 64.00 pack-year  smoking history. He has never used smokeless tobacco. He reports that he does not drink alcohol or use drugs. family history includes Alcohol abuse in his father; Bladder Cancer in his mother; Diabetes in his maternal grandmother; Heart Problems in his maternal grandmother; Heart disease in his maternal grandfather; Melanoma in his father; Prostate cancer in his maternal grandfather; Stroke in his maternal grandmother. Allergies  Allergen Reactions  . Morphine Sulfate Rash and Other (See Comments)    Itches all over, red spots     Review of Systems  Constitutional: Positive for fever. Negative for chills.  Respiratory: Positive for cough. Negative for shortness of breath and wheezing.   Cardiovascular: Negative for chest pain.  Gastrointestinal: Negative for nausea and vomiting.       Objective:   Physical Exam  Constitutional: He appears well-developed and well-nourished.  HENT:  Right Ear: External ear normal.  Left Ear: External ear normal.  Mouth/Throat: Oropharynx is clear and moist.  Neck: Neck supple.  Cardiovascular: Normal rate and regular rhythm.   Pulmonary/Chest: Effort normal and breath sounds normal. No respiratory distress. He has no wheezes. He has no rales.  Lymphadenopathy:    He has no  cervical adenopathy.       Assessment:     Patient presents with one-week history of progressive cough and reported low-grade fever though none noted at this time. He has not had any hypoxemia, hypotension, tachycardia, or other concerns on exam and nonfocal exam    Plan:     -We elected to go and cover with Levaquin given his multiple medical problems as above. Will take 500 mg today and then 250  mg daily for 6 more days -Follow-up promptly for any dyspnea or prolonged fever  [name] MD [facility]

## 2016-07-06 ENCOUNTER — Encounter: Payer: Self-pay | Admitting: Vascular Surgery

## 2016-07-06 ENCOUNTER — Ambulatory Visit (HOSPITAL_COMMUNITY)
Admission: RE | Admit: 2016-07-06 | Discharge: 2016-07-06 | Disposition: A | Discharge status: Home / Self Care | Payer: BLUE CROSS/BLUE SHIELD | Source: Ambulatory Visit | Attending: Vascular Surgery | Admitting: Vascular Surgery

## 2016-07-06 ENCOUNTER — Ambulatory Visit (INDEPENDENT_AMBULATORY_CARE_PROVIDER_SITE_OTHER): Payer: BLUE CROSS/BLUE SHIELD | Admitting: Vascular Surgery

## 2016-07-06 VITALS — BP 157/73 | HR 81 | Temp 98.9°F | Resp 16 | Ht 70.0 in | Wt 181.0 lb

## 2016-07-06 DIAGNOSIS — N186 End stage renal disease: Secondary | ICD-10-CM | POA: Diagnosis not present

## 2016-07-06 DIAGNOSIS — N184 Chronic kidney disease, stage 4 (severe): Secondary | ICD-10-CM | POA: Diagnosis not present

## 2016-07-06 DIAGNOSIS — Z992 Dependence on renal dialysis: Secondary | ICD-10-CM | POA: Diagnosis not present

## 2016-07-06 NOTE — Progress Notes (Signed)
Patient name: Anthony Bullock MRN: [id number] DOB: 1960/06/20 Sex: male  REASON FOR VISIT:    Follow up of left brachiocephalic AV fistula.  HPI:   Anthony Bullock is a pleasant 56 y.o. male who had a left brachiocephalic fistula placed on 01/14/2016. This has been slow to mature and he has been reluctant to consider a fistulogram. He had a previous right basilic vein transposition and underwent venoplasty of that which was not successful. For this reason he is very discouraged about any further interventions on his fistula.  He dialyzes on Tuesdays Thursdays and Saturdays using his right IJ tunneled dialysis catheter.  He has a right below the knee amputation and is being fitted for prosthesis I believe. I performed a right femoral to below-knee popliteal artery bypass which is still functioning.  He denies any pain or paresthesias in his left arm.  Current Outpatient Prescriptions  Medication Sig Dispense Refill  . aspirin EC 81 MG tablet Take 81 mg by mouth daily.     [name] atorvastatin (LIPITOR) 80 MG tablet Take 1 tablet (80 mg total) by mouth at bedtime. 90 tablet 1  . azelastine (ASTELIN) 0.1 % nasal spray Place 2 sprays into both nostrils 3 (three) times daily as needed for rhinitis. Use in each nostril as directed 30 mL 12  . BD PEN NEEDLE NANO U/F 32G X 4 MM MISC USE ONCE DAILY 90 each 2  . carvedilol (COREG) 12.5 MG tablet Take 1 tablet (12.5 mg total) by mouth 2 (two) times daily with a meal. 180 tablet 1  . fenofibrate 160 MG tablet Take 1 tablet (160 mg total) by mouth daily. 90 tablet 1  . fexofenadine (ALLEGRA) 180 MG tablet Take 180 mg by mouth daily.    . furosemide (LASIX) 40 MG tablet Take 120 mg by mouth daily.    [name] gabapentin (NEURONTIN) 300 MG capsule Take 1 capsule (300 mg total) by mouth 3 (three) times daily. 270 capsule 2  . glucose blood test strip Check 1 time daily. E11.9 One Touch Ultra Blue Test Strips 100 each 3  . Insulin Detemir (LEVEMIR FLEXPEN) 100 UNIT/ML  Pen Inject 44 Units into the skin daily at 10 pm. 15 mL 11  . insulin lispro (HUMALOG) 100 UNIT/ML injection Inject 0-14 Units into the skin 3 (three) times daily before meals. Dose per sliding scale    . lanthanum (FOSRENOL) 1000 MG chewable tablet Chew 1,000 mg by mouth 3 (three) times daily with meals. Take one tablet after each meal.     . multivitamin (RENA-VIT) TABS tablet Take 1 tablet by mouth daily.  99  . nystatin cream (MYCOSTATIN) Apply 1 application topically 2 (two) times daily. 30 g 2  . Olopatadine HCl (PATADAY) 0.2 % SOLN Place 1 drop into both eyes daily as needed (for allergies).      No current facility-administered medications for this visit.     REVIEW OF SYSTEMS:  [X]  denotes positive finding, [ ]  denotes negative finding Cardiac  Comments:  Chest pain or chest pressure:    Shortness of breath upon exertion:    Short of breath when lying flat:    Irregular heart rhythm:    Constitutional    Fever or chills:     PHYSICAL EXAM:   Vitals:   07/06/16 1613  BP: (!) 157/73  Pulse: 81  Resp: 16  Temp: 98.9 F (37.2 C)  TempSrc: Oral  SpO2: 95%  Weight: 181 lb (82.1 kg)  Height:  5\' 10"  (1.778 m)    GENERAL: The patient is a well-nourished male, in no acute distress. The vital signs are documented above. CARDIOVASCULAR: There is a regular rate and rhythm. PULMONARY: There is good air exchange bilaterally without wheezing or rales. VASCULAR: He has a good thrill in his left upper arm fistula although the vein is small in the mid and upper arm. The fistula is slightly pulsatile. He has a palpable left radial pulse. He has a palpable right popliteal pulse and a functioning right femoropopliteal bypass graft.  DATA:   DUPLEX LEFT AV FISTULA: I have independently interpreted the duplex of his left upper arm fistula. The diameters of the fistula range from 0.29-0.65 cm. There are some elevated velocities in the mid upper arm.  MEDICAL ISSUES:   POORLY MATURING  LEFT BRACHIOCEPHALIC AV FISTULA: The fistula in the left arm has not matured adequately. He does have a good thrill but I suspect he has some narrowing in the mid upper arm. I have again recommended a fistulogram. He feels strongly that he would not like to have a fistulogram given that previous venoplasty in the right arm was not successful. I think it would at least be reasonable to use 1 needle on the proximal fistula and hopefully the vein will continue to enlarge and ultimately become usable. Certainly if he changes his mind about undergoing a fistulogram I would be happy to schedule that on a nondialysis day.  [name] [facility] [phone]

## 2016-07-08 ENCOUNTER — Telehealth (INDEPENDENT_AMBULATORY_CARE_PROVIDER_SITE_OTHER): Payer: Self-pay | Admitting: Orthopedic Surgery

## 2016-07-08 ENCOUNTER — Other Ambulatory Visit (INDEPENDENT_AMBULATORY_CARE_PROVIDER_SITE_OTHER): Payer: Self-pay

## 2016-07-08 NOTE — Telephone Encounter (Signed)
Juliann Pulse from [employer] called needing a RX for gate training for the patient. Fax # [phone]

## 2016-07-08 NOTE — Telephone Encounter (Signed)
Order written for prosthetic gait training for right BKA and faxed to [employer]

## 2016-08-15 ENCOUNTER — Telehealth: Payer: Self-pay | Admitting: Cardiology

## 2016-08-15 NOTE — Telephone Encounter (Signed)
Patient called asking if [facility] had contacted Dr Harl Bowie yet in reference to ordering test for him to get put on Kidney transplant list.

## 2016-08-16 ENCOUNTER — Encounter (INDEPENDENT_AMBULATORY_CARE_PROVIDER_SITE_OTHER): Payer: Self-pay | Admitting: Orthopedic Surgery

## 2016-08-16 ENCOUNTER — Ambulatory Visit (INDEPENDENT_AMBULATORY_CARE_PROVIDER_SITE_OTHER): Payer: BLUE CROSS/BLUE SHIELD | Admitting: Orthopedic Surgery

## 2016-08-16 ENCOUNTER — Ambulatory Visit: Payer: BLUE CROSS/BLUE SHIELD | Attending: Family | Admitting: Physical Therapy

## 2016-08-16 VITALS — Ht 70.0 in | Wt 181.0 lb

## 2016-08-16 DIAGNOSIS — B351 Tinea unguium: Secondary | ICD-10-CM

## 2016-08-16 DIAGNOSIS — R296 Repeated falls: Secondary | ICD-10-CM | POA: Diagnosis present

## 2016-08-16 DIAGNOSIS — E1142 Type 2 diabetes mellitus with diabetic polyneuropathy: Secondary | ICD-10-CM

## 2016-08-16 DIAGNOSIS — M6281 Muscle weakness (generalized): Secondary | ICD-10-CM | POA: Diagnosis present

## 2016-08-16 DIAGNOSIS — Z89511 Acquired absence of right leg below knee: Secondary | ICD-10-CM

## 2016-08-16 DIAGNOSIS — R2681 Unsteadiness on feet: Secondary | ICD-10-CM | POA: Diagnosis present

## 2016-08-16 DIAGNOSIS — M79661 Pain in right lower leg: Secondary | ICD-10-CM | POA: Diagnosis present

## 2016-08-16 DIAGNOSIS — R2689 Other abnormalities of gait and mobility: Secondary | ICD-10-CM | POA: Insufficient documentation

## 2016-08-16 NOTE — Progress Notes (Signed)
Office Visit Note   Patient: Anthony Bullock           Date of Birth: 04/09/1960           MRN: [id number] Visit Date: 08/16/2016              Requested by: [name], MD [place], [place] [address] PCP: [name], MD  Chief Complaint  Patient presents with  . Right Leg - Follow-up    04/26/16 right BKA  . Left Foot - Nail Problem      HPI: Patient is a 56 year old gentleman status post right transtibial amputation who presents for evaluation for the residual limb on the right as well as painful onychomycotic nails on the left. Patient starting gait training with Shirlean Mylar today.  Assessment & Plan: Visit Diagnoses:  1. Status post unilateral below knee amputation, right ([place])   2. Onychomycosis     Plan: Nails were trimmed 5 on the left foot. Patient was shown how to apply his prosthetic liner. Patient will follow up with [name] in 3 months for reevaluation or sooner if he develops any blisters or ulcers.  Follow-Up Instructions: Return in about 3 months (around 11/16/2016).   Ortho Exam  Patient is alert, oriented, no adenopathy, well-dressed, normal affect, normal respiratory effort. Examination patient has an antalgic gait. Examination the right transtibial amputation there was a well consolidated residual limb no ulcers no callus no skin breakdown. There is no cellulitis no signs of infection. Examination the left foot he has no plantar ulcers no venous stasis ulcers he does have thickened discolored onychomycotic nails 5 he is unable to safely trim nails on his own due to his insensate diabetic neuropathy and the nails were trimmed 5 without complications.  Imaging: No results found.  Labs: Lab Results  Component Value Date   HGBA1C 11.3 (H) 03/30/2016   HGBA1C 8.1 (H) 11/29/2015   HGBA1C 8.8 04/29/2015   ESRSEDRATE 94 (H) 03/30/2016   ESRSEDRATE 18 11/30/2012   CRP 16.1 (H) 03/30/2016   REPTSTATUS 04/05/2016 FINAL 03/31/2016   REPTSTATUS 04/04/2016 FINAL 03/31/2016   GRAMSTAIN  03/31/2016    RARE WBC PRESENT, PREDOMINANTLY PMN NO ORGANISMS SEEN    CULT NO ANAEROBES ISOLATED 03/31/2016   CULT  03/31/2016    RARE STENOTROPHOMONAS MALTOPHILIA RARE ENTEROBACTER SPECIES    LABORGA STENOTROPHOMONAS MALTOPHILIA 03/31/2016   LABORGA ENTEROBACTER SPECIES 03/31/2016    Orders:  No orders of the defined types were placed in this encounter.  No orders of the defined types were placed in this encounter.    Procedures: No procedures performed  Clinical Data: No additional findings.  ROS:  All other systems negative, except as noted in the HPI. Review of Systems  Objective: Vital Signs: Ht _0  (1.778 m)   Wt 181 lb (82.1 kg)   BMI 25.97 kg/m   Specialty Comments:  No specialty comments available.  PMFS History: Patient Active Problem List   Diagnosis Date Noted  . Onychomycosis 08/16/2016  . Status post unilateral below knee amputation, right ([place]) 04/26/2016  . Cutaneous abscess of right foot 04/19/2016  . Diabetic foot infection ([place])   . Diabetic foot ulcer ([place]) 03/30/2016  . Bilateral carotid bruits 03/30/2016  . Diabetes mellitus with complication ([place])   . Anemia due to chronic kidney disease   . ESRD (end stage renal disease) on dialysis ([place]) 11/30/2015  . Acute respiratory failure with hypoxia ([place]) 11/29/2015  . At high risk  for falls 09/29/2014  . Peripheral vascular disease ([place]) 09/29/2014  . Osteoarthritis of both knees 01/07/2014  . Osteoarthritis of multiple joints 10/06/2013  . NICM (nonischemic cardiomyopathy) ([place]) 06/27/2013  . CAD (coronary artery disease) 03/13/2013  . Abnormal nuclear stress test 01/27/2013  . Chronic systolic congestive heart failure ([place]) 01/27/2013  . COPD (chronic obstructive pulmonary disease) ([place]) 12/27/2012  . Diabetic polyneuropathy associated with type 2 diabetes mellitus ([place]) 02/10/2012  . PYELITIS/PYELONEPHRITIS DISEASES CLASSIFIED  ELSW 09/28/2009  . URINARY CALCULUS 09/28/2009  . Adjustment disorder with depressed mood 08/21/2009  . Poorly controlled type II diabetes mellitus with renal complication ([place]) 19/62/2297  . Hyperlipidemia 04/14/2008  . Essential hypertension 04/14/2008  . ALLERGIC RHINITIS 04/14/2008   Past Medical History:  Diagnosis Date  . 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 Allergic rhinitis   . Anemia   . Arthritis   . Chronic systolic heart failure ([place])    a. ECHO (12/2012) EF 25-30%, HK entireanteroseptal myocardium //  b.  EF 25%, diffuse HK, grade 1 diastolic dysfunction, MAC, mild LAE, normal RVSF, trivial pericardial effusion  . COPD (chronic obstructive pulmonary disease) ([place])   . Diabetes mellitus type II   . Diabetic nephropathy ([place])   . Diabetic neuropathy ([place])   . ESRD on hemodialysis [facility])    started HD June 2017, goes to [facility] HD unit, Dr Hinda Lenis  . History of cardiac catheterization    a.Myoview 1/15:  There is significant left ventricular dysfunction. There may be slight scar at the apex. There is no significant ischemia. LV Ejection Fraction: 27%  //  b. RHC/LHC (1/15) with mean RA 6, PA 47/22 mean 33, mean PCWP 20, PVR 2.5 WU, CI 2.5; 80% dLAD stenosis, 70% diffuse large D.   . History of kidney stones   . Hyperlipidemia   . Hypertension   . Kidney stones   . NICM (nonischemic cardiomyopathy) ([place])    Primarily nonischemic. Echo (12/14) with EF 25-30%. Echo (3/15) with EF 25%, mild to moderately dilated LV, normal RV size and systolic function.   . Pneumonia   . Urethral stricture     Family History  Problem Relation Age of Onset  . Bladder Cancer Mother   . Alcohol abuse Father   . Melanoma Father   . Stroke Maternal Grandmother   . Heart Problems Maternal Grandmother        unknown  . Diabetes Maternal Grandmother   . Heart disease Maternal Grandfather   . Prostate cancer Maternal Grandfather     Past  Surgical History:  Procedure Laterality Date  . ABDOMINAL AORTOGRAM W/LOWER EXTREMITY N/A 03/30/2016   Procedure: Abdominal Aortogram w/Lower Extremity;  Surgeon: Angelia Mould, MD;  Location: [facility];  Service: Cardiovascular;  Laterality: N/A;  . AMPUTATION Right 04/26/2016   Procedure: Right Below Knee Amputation;  Surgeon: Newt Minion, MD;  Location: [facility];  Service: Orthopedics;  Laterality: Right;  . AV FISTULA PLACEMENT Right 09/08/2015   Procedure: INSERTION OF 4-18m x 45cm  ARTERIOVENOUS (AV) GORE-TEX GRAFT RIGHT UPPER  ARM;  Surgeon: CAngelia Mould MD;  Location: [place]  Service: Vascular;  Laterality: Right;  . AV FISTULA PLACEMENT Left 01/14/2016   Procedure: CREATION OF LEFT UPPER ARM ARTERIOVENOUS FISTULA;  Surgeon: CAngelia Mould MD;  [place]  Service: Vascular;  Laterality: Left;  . BASCILIC VEIN TRANSPOSITION Right 08/22/2014   Procedure: RIGHT UPPER ARM BRobinson  Surgeon:  Angelia Mould, MD;  Location: [facility];  Service: Vascular;  Laterality: Right;  . BELOW KNEE LEG AMPUTATION Right 04/26/2016  . CARDIAC CATHETERIZATION    . CARDIAC DEFIBRILLATOR PLACEMENT  06/27/2013   Sub Q       BY DR Caryl Comes  . COLONOSCOPY WITH PROPOFOL N/A 07/22/2015   Procedure: COLONOSCOPY WITH PROPOFOL;  Surgeon: Doran Stabler, MD;  Location: [facility];  Service: Gastroenterology;  Laterality: N/A;  . FEMORAL-POPLITEAL BYPASS GRAFT Right 03/31/2016   Procedure: BYPASS GRAFT FEMORAL-POPLITEAL ARTERY USING RIGHT GREATER SAPHENOUS NONREVERSED VEIN;  Surgeon: Angelia Mould, MD;  Location: [facility];  Service: Vascular;  Laterality: Right;  . HERNIA REPAIR    . I&D EXTREMITY Right 03/31/2016   Procedure: IRRIGATION AND DEBRIDEMENT FOOT;  Surgeon: Angelia Mould, MD;  Location: [facility];  Service: Vascular;  Laterality: Right;  . IMPLANTABLE CARDIOVERTER DEFIBRILLATOR IMPLANT N/A 06/27/2013   Procedure: SUB Q ICD;  Surgeon: Deboraha Sprang, MD;  Location: [facility] CATH LAB;  Service: Cardiovascular;  Laterality: N/A;  . INTRAOPERATIVE ARTERIOGRAM Right 03/31/2016   Procedure: INTRA OPERATIVE ARTERIOGRAM;  Surgeon: Angelia Mould, MD;  Location: [place];  Service: Vascular;  Laterality: Right;  . IR GENERIC HISTORICAL Right 11/30/2015   IR THROMBECTOMY AV FISTULA W/THROMBOLYSIS/PTA INC/SHUNT/IMG RIGHT 11/30/2015 Aletta Edouard, MD [facility]  . IR GENERIC HISTORICAL  11/30/2015   IR US GUIDE VASC ACCESS RIGHT 11/30/2015 Aletta Edouard, MD [facility]  . IR GENERIC HISTORICAL Right 12/15/2015   IR THROMBECTOMY AV FISTULA W/THROMBOLYSIS/PTA/STENT INC/SHUNT/IMG RT 12/15/2015 Arne Cleveland, MD [facility]  . IR GENERIC HISTORICAL  12/15/2015   IR US GUIDE VASC ACCESS RIGHT 12/15/2015 Arne [place], MD [facility]  . IR GENERIC HISTORICAL  12/28/2015   IR FLUORO GUIDE CV LINE RIGHT 12/28/2015 Marybelle Killings, MD [facility]  . IR GENERIC HISTORICAL  12/28/2015   IR US GUIDE VASC ACCESS RIGHT 12/28/2015 Marybelle Killings, MD [facility]  . LEFT A ND RIGHT HEART CATH  01/30/2013   DR Sung Amabile  . LEFT AND RIGHT HEART CATHETERIZATION WITH CORONARY ANGIOGRAM N/A 01/30/2013   Procedure: LEFT AND RIGHT HEART CATHETERIZATION WITH CORONARY ANGIOGRAM;  Surgeon: Jolaine Artist, MD;  Location: [facility] CATH LAB;  Service: Cardiovascular;  Laterality: N/A;  . PERIPHERAL VASCULAR CATHETERIZATION Right 01/26/2015   Procedure: A/V Fistulagram;  Surgeon: Angelia Mould, MD;  Location: [place];  Service: Cardiovascular;  Laterality: Right;  . reapea urethral surgery for recurrent obstruction  2011  . TOTAL KNEE ARTHROPLASTY Right 2007  . VEIN HARVEST Right 03/31/2016   Procedure: RIGHT GREATER SAPHENOUS VEIN HARVEST;  Surgeon: Angelia Mould, MD;  Location: [facility];  Service: Vascular;  Laterality: Right;   Social History   Occupational History  . Not on file.   Social History Main Topics  . Smoking status: Former Smoker     Packs/day: 2.00    Years: 32.00    Types: Cigarettes    Quit date: 05/11/2010  . Smokeless tobacco: Never Used  . Alcohol use No  . Drug use: No  . Sexual activity: Yes

## 2016-08-16 NOTE — Telephone Encounter (Signed)
LM to return call.

## 2016-08-17 ENCOUNTER — Encounter: Payer: Self-pay | Admitting: Physical Therapy

## 2016-08-17 NOTE — Therapy (Signed)
[place] [address] [place], [place], [address] Phone: [phone]   Fax:  [phone]  Physical Therapy Evaluation  Patient Details  Name: Anthony Bullock MRN: [id number] Date of Birth: 29-Oct-1960 Referring Provider: Meridee Score, MD  Encounter Date: 08/16/2016      PT End of Session - 08/16/16 1908    Visit Number 1   Number of Visits 25   Date for PT Re-Evaluation 11/18/16   Authorization Type BCBS   Authorization Time Period $650 deductible met, 20% coinsurance, 60 visit comb limit, 0 authorization,    PT Start Time 1452   PT Stop Time 1545   PT Time Calculation (min) 53 min   Equipment Utilized During Treatment Gait belt   Activity Tolerance Patient tolerated treatment well;Patient limited by pain   Behavior During Therapy WFL for tasks assessed/performed      Past Medical History:  Diagnosis Date  . 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 Allergic rhinitis   . Anemia   . Arthritis   . Chronic systolic heart failure ([place])    a. ECHO (12/2012) EF 25-30%, HK entireanteroseptal myocardium //  b.  EF 25%, diffuse HK, grade 1 diastolic dysfunction, MAC, mild LAE, normal RVSF, trivial pericardial effusion  . COPD (chronic obstructive pulmonary disease) ([place])   . Diabetes mellitus type II   . Diabetic nephropathy ([place])   . Diabetic neuropathy ([place])   . ESRD on hemodialysis [facility])    started HD June 2017, goes to [facility] HD unit, Dr Hinda Lenis  . History of cardiac catheterization    a.Myoview 1/15:  There is significant left ventricular dysfunction. There may be slight scar at the apex. There is no significant ischemia. LV Ejection Fraction: 27%  //  b. RHC/LHC (1/15) with mean RA 6, PA 47/22 mean 33, mean PCWP 20, PVR 2.5 WU, CI 2.5; 80% dLAD stenosis, 70% diffuse large D.   . History of kidney stones   . Hyperlipidemia   . Hypertension   . Kidney stones   . NICM  (nonischemic cardiomyopathy) ([place])    Primarily nonischemic. Echo (12/14) with EF 25-30%. Echo (3/15) with EF 25%, mild to moderately dilated LV, normal RV size and systolic function.   . Pneumonia   . Urethral stricture     Past Surgical History:  Procedure Laterality Date  . ABDOMINAL AORTOGRAM W/LOWER EXTREMITY N/A 03/30/2016   Procedure: Abdominal Aortogram w/Lower Extremity;  Surgeon: Angelia Mould, MD;  Location: [place] [facility];  Service: Cardiovascular;  Laterality: N/A;  . AMPUTATION Right 04/26/2016   Procedure: Right Below Knee Amputation;  Surgeon: Newt Minion, MD;  Location: [place];  Service: Orthopedics;  Laterality: Right;  . AV FISTULA PLACEMENT Right 09/08/2015   Procedure: INSERTION OF 4-102m x 45cm  ARTERIOVENOUS (AV) GORE-TEX GRAFT RIGHT UPPER  ARM;  Surgeon: CAngelia Mould MD;  [place]  Service: Vascular;  Laterality: Right;  . AV FISTULA PLACEMENT Left 01/14/2016   Procedure: CREATION OF LEFT UPPER ARM ARTERIOVENOUS FISTULA;  Surgeon: CAngelia Mould MD;  [place] [facility]  Service: Vascular;  Laterality: Left;  . BASCILIC VEIN TRANSPOSITION Right 08/22/2014   Procedure: RIGHT UPPER ARM BASCILIC VEIN TRANSPOSITION;  Surgeon: CAngelia Mould MD;  [place]  Service: Vascular;  Laterality: Right;  . BELOW KNEE LEG AMPUTATION Right 04/26/2016  . CARDIAC CATHETERIZATION    . CARDIAC DEFIBRILLATOR PLACEMENT  06/27/2013   Sub Q  BY DR Caryl Comes  . COLONOSCOPY WITH PROPOFOL N/A 07/22/2015   Procedure: COLONOSCOPY WITH PROPOFOL;  Surgeon: Doran Stabler, MD;  [facility];  Service: Gastroenterology;  Laterality: N/A;  . FEMORAL-POPLITEAL BYPASS GRAFT Right 03/31/2016   Procedure: BYPASS GRAFT FEMORAL-POPLITEAL ARTERY USING RIGHT GREATER SAPHENOUS NONREVERSED VEIN;  Surgeon: Angelia Mould, MD;  Location: [place];  Service: Vascular;  Laterality: Right;  . HERNIA REPAIR    . I&D EXTREMITY Right 03/31/2016    Procedure: IRRIGATION AND DEBRIDEMENT FOOT;  Surgeon: Angelia Mould, MD;  Location: [place];  Service: Vascular;  Laterality: Right;  . IMPLANTABLE CARDIOVERTER DEFIBRILLATOR IMPLANT N/A 06/27/2013   Procedure: SUB Q ICD;  Surgeon: Deboraha Sprang, MD;  Location: [facility] C[facility] LAB;  Service: Cardiovascular;  Laterality: N/A;  . INTRAOPERATIVE ARTERIOGRAM Right 03/31/2016   Procedure: INTRA OPERATIVE ARTERIOGRAM;  Surgeon: Angelia Mould, MD;  Location: [place];  Service: Vascular;  Laterality: Right;  . IR GENERIC HISTORICAL Right 11/30/2015   IR THROMBECTOMY AV FISTULA W/THROMBOLYSIS/PTA INC/SHUNT/IMG RIGHT 11/30/2015 Aletta Edouard, MD [facility]  . IR GENERIC HISTORICAL  11/30/2015   IR US GUIDE VASC ACCESS RIGHT 11/30/2015 Aletta Edouard, MD [facility]  . IR GENERIC HISTORICAL Right 12/15/2015   IR THROMBECTOMY AV FISTULA W/THROMBOLYSIS/PTA/STENT INC/SHUNT/IMG RT 12/15/2015 Arne Cleveland, MD [facility]  . IR GENERIC HISTORICAL  12/15/2015   IR US GUIDE VASC ACCESS RIGHT 12/15/2015 Arne Cleveland, MD [facility]  . IR GENERIC HISTORICAL  12/28/2015   IR FLUORO GUIDE CV LINE RIGHT 12/28/2015 Marybelle Killings, MD [facility]  . IR GENERIC HISTORICAL  12/28/2015   IR US GUIDE VASC ACCESS RIGHT 12/28/2015 Marybelle Killings, MD [facility]  . LEFT A ND RIGHT HEART CATH  01/30/2013   DR Sung Amabile  . LEFT AND RIGHT HEART CATHETERIZATION WITH CORONARY ANGIOGRAM N/A 01/30/2013   Procedure: LEFT AND RIGHT HEART CATHETERIZATION WITH CORONARY ANGIOGRAM;  Surgeon: Jolaine Artist, MD;  Location: [facility] CATH LAB;  Service: Cardiovascular;  Laterality: N/A;  . PERIPHERAL VASCULAR CATHETERIZATION Right 01/26/2015   Procedure: A/V Fistulagram;  Surgeon: Angelia Mould, MD;  Location: [place] [facility];  Service: Cardiovascular;  Laterality: Right;  . reapea urethral surgery for recurrent obstruction  2011  . TOTAL KNEE ARTHROPLASTY Right 2007  . VEIN HARVEST Right 03/31/2016   Procedure: RIGHT  GREATER SAPHENOUS VEIN HARVEST;  Surgeon: Angelia Mould, MD;  Location: [place];  Service: Vascular;  Laterality: Right;    There were no vitals filed for this visit.       Subjective Assessment - 08/16/16 1457    Subjective This 56yo male underwent a Transtibial Amputation on 04/26/2016 due to failed Fem-Pop bypass graft on 03/31/2016. He recieved prosthesis on 07/21/2016. He presents for physical therapy evaluation wearing his prosthesis ambulating with RW.    Patient is accompained by: Family member  wife, nieces   Pertinent History Rt TTA, ESRD, DM, HTN, peripheral neuropathy, cardiomyopathy, CHF, COPD, AVF sg, R total knee replacement 2007   Limitations Lifting;Standing;Walking;House hold activities   Patient Stated Goals Walk without assistance or an AD in the community   Currently in Pain? Yes   Pain Score 9    Pain Location Leg  residual limb   Pain Orientation Right;Anterior  distal tibia   Pain Descriptors / Indicators Sharp   Pain Type Acute pain   Pain Onset 1 to 4 weeks ago   Pain Frequency Intermittent   Aggravating Factors  prosthesis in standing &  gait   Pain Relieving Factors sitting decreases & removing prosthesis relieves after a couple of hours   Effect of Pain on Daily Activities limits standing & walking   Multiple Pain Sites No            [facility] PT Assessment - 08/16/16 1510      Assessment   Medical Diagnosis Right Transtibial Amputation   Referring Provider Meridee Score, MD   Onset Date/Surgical Date 07/21/16   Hand Dominance Right   Prior Therapy none     Precautions   Precautions Fall;ICD/Pacemaker;Other (comment)   Precaution Comments B AV fistulas; take BP on R arm only; NO ESTIM     Restrictions   Weight Bearing Restrictions No     Balance Screen   Has the patient fallen in the past 6 months Yes   How many times? 2  Got off balance w/o prosthesis; opened residual limb   Has the patient had a decrease in activity level because of a  fear of falling?  Yes   Is the patient reluctant to leave their home because of a fear of falling?  Yes     [place] residence   Living Arrangements Spouse/significant other   Type of [place] entrance  Approach from grass   [place] One level   [place] - 2 wheels;Walker - 4 wheels;Crutches;Wheelchair - manual;Cane - single point;Shower seat - built in     Prior Function   Level of Independence Independent;Independent with gait;Independent with transfers;Independent with household mobility without device;Independent with community mobility without device  limited distance, no device   Vocation On disability     Cognition   Overall Cognitive Status Within Functional Limits for tasks assessed   Attention Focused   Focused Attention Appears intact   Memory Appears intact   Awareness Appears intact   Problem Solving Appears intact     Posture/Postural Control   Posture/Postural Control Postural limitations   Postural Limitations Rounded Shoulders;Forward head;Flexed trunk;Weight shift left     ROM / Strength   AROM / PROM / Strength AROM;Strength     AROM   Overall AROM  Within functional limits for tasks performed   AROM Assessment Site Shoulder;Elbow;Finger;Hip;Knee;Ankle  Some hamstring tightness noted     Strength   Overall Strength Within functional limits for tasks performed   Overall Strength Comments Gross UE & LE testing in sitting WFL   Strength Assessment Site --     Transfers   Transfers Sit to Stand;Stand to Sit   Sit to Stand 5: Supervision;With upper extremity assist;With armrests;From chair/3-in-1  RW for stabilization or pushes back of legs against chair   Stand to Sit 5: Supervision;With upper extremity assist;With armrests;To chair/3-in-1  from RW for stabilization or back of legs against chair     Ambulation/Gait   Ambulation/Gait Yes   Ambulation/Gait Assistance 5:  Supervision   Ambulation/Gait Assistance Details excessive weight bearing thru UEs on RW   Ambulation Distance (Feet) 150 Feet   Assistive device Rolling walker;Prosthesis   Gait Pattern Step-through pattern;Decreased stance time - right;Decreased step length - left;Decreased stride length;Decreased hip/knee flexion - right;Decreased weight shift to right;Right hip hike;Right circumduction;Right flexed knee in stance;Antalgic;Trunk flexed;Abducted- right;Poor foot clearance - right   Ambulation Surface Level;Indoor   Gait velocity 1.04 ft/sec   Stairs --     Balance   Balance Assessed Yes     Standardized Balance  Assessment   Standardized Balance Assessment Berg Balance Test     Berg Balance Test   Sit to Stand Able to stand  independently using hands   Standing Unsupported Able to stand 30 seconds unsupported   Sitting with Back Unsupported but Feet Supported on Floor or Stool Able to sit safely and securely 2 minutes   Stand to Sit Controls descent by using hands   Transfers Able to transfer safely, definite need of hands   Standing Unsupported with Eyes Closed Able to stand 10 seconds with supervision   Standing Ubsupported with Feet Together Needs help to attain position and unable to hold for 15 seconds   From Standing, Reach Forward with Outstretched Arm Loses balance while trying/requires external support   From Standing Position, Pick up Object from Floor Unable to try/needs assist to keep balance   From Standing Position, Turn to Look Behind Over each Shoulder Looks behind from both sides and weight shifts well   Turn 360 Degrees Needs assistance while turning   Standing Unsupported, Alternately Place Feet on Step/Stool Needs assistance to keep from falling or unable to try   Standing Unsupported, One Foot in [employer] balance while stepping or standing   Standing on One Leg Unable to try or needs assist to prevent fall   Total Score 22         Prosthetics Assessment -  08/16/16 [place] with Skin check;Residual limb care;Care of non-amputated limb;Prosthetic cleaning;Ply sock cleaning;Correct ply sock adjustment;Proper wear schedule/adjustment;Proper weight-bearing schedule/adjustment   Donning prosthesis  Supervision   Doffing prosthesis  Supervision   Current prosthetic wear tolerance (days/week)  7 days/wk   Current prosthetic wear tolerance (#hours/day)  6-8 hrs of 15-16 awake hours   Current prosthetic weight-bearing tolerance (hours/day)  Patient tolerated 10 min of standing & gait with prosthesis with partial weight on prosthesis with limb pain 9/10   Edema none           Objective measurements completed on examination: See above findings.          [place] Adult PT Treatment/Exercise - 08/16/16 1520      Ambulation/Gait   Stairs Yes   Stairs Assistance 4: Min assist   Stairs Assistance Details (indicate cue type and reason) PT demo & instructed in technique including sequence   Stair Management Technique Two rails;Step to pattern;Forwards   Number of Stairs 4   Ramp 4: Min assist  RW & prosthesis    Ramp Details (indicate cue type and reason) PT demo & instructed in technique including posture & wt shift   Curb 4: Min assist  RW & prosthesis   Curb Details (indicate cue type and reason) PT demo & verbalized technique including sequence & foot position     Prosthetics   Prosthetic Care Comments  Weighing and wearing prosthesis at dialysis.  Wear 5 hrs 2/day drying limb & liner mid-5 hrs   Residual limb condition  No open wounds, no hair growth, mildly sweaty, scar not adhered, temp normal, color normal to rest of leg   Education Provided Residual limb care;Prosthetic cleaning;Correct ply sock adjustment;Proper Donning;Proper Doffing;Proper wear schedule/adjustment;Care of non-amputated limb;Skin check   Person(s) Educated Patient;Spouse   Education Method Explanation;Demonstration;Verbal cues    Education Method Verbalized understanding;Verbal cues required;Needs further instruction                  PT Short Term Goals - 08/16/16 1923  PT SHORT TERM GOAL #1   Title Patient tolerates prosthesis wear >10hrs total /day with no skin issues & limb pain </= 6/10    Time 4   Period Weeks   Status New   Target Date 09/22/16     PT SHORT TERM GOAL #2   Title Patient verbalizes proper cleaning, weighing & wearing at dialysis.    Time 4   Period Weeks   Status New   Target Date 09/22/16     PT SHORT TERM GOAL #3   Title Patient ambulates 300' with RW or crutches & prosthesis with supervision.    Time 4   Period Weeks   Status New   Target Date 09/22/16     PT SHORT TERM GOAL #4   Title Patient negotiates ramps, curbs with RW or crutches & stairs with 1 rail with supervision.    Time 4   Period Weeks   Status New   Target Date 09/22/16     PT SHORT TERM GOAL #5   Title Patient picks up objects from floor without UE support safely.    Time 4   Period Weeks   Status New   Target Date 09/22/16           PT Long Term Goals - 08/16/16 1928      PT LONG TERM GOAL #1   Title Patient verbalizes proper prosthetic care including wear/use at dialysis to enable safe use of prosthesis.    Time 12   Period Weeks   Status New   Target Date 11/18/16     PT LONG TERM GOAL #2   Title Patient tolerates wear of prosthesis >90% of awake hours with no skin issues & limb pain </= 2/10   Time 12   Period Weeks   Target Date 11/18/16     PT LONG TERM GOAL #3   Title Patient ambulates 9' around household furniture carrying plate & cup with prosthesis only modified independent.    Time 12   Period Weeks   Status New   Target Date 11/18/16     PT LONG TERM GOAL #4   Title Berg Balance >45/56 to indicate lower fall risk.    Time 12   Period Weeks   Status New   Target Date 11/18/16     PT LONG TERM GOAL #5   Title Patient ambulates 500' outdoors including  grass with cane & prosthesis modified independent to enable community mobility.    Time 12   Period Weeks   Status New   Target Date 11/18/16     Additional Long Term Goals   Additional Long Term Goals Yes     PT LONG TERM GOAL #6   Title Patient negotiates ramps, curbs & stairs (1 rail) with cane & prosthesis modified independent for community access.    Time 12   Period Weeks   Status New   Target Date 11/18/16     PT LONG TERM GOAL #7   Title Patient able to lift/ carry 15# box, push /pull items & negotiate around/over ostacles with prosthesis modified independent for household tasks.    Time 12   Period Weeks   Status New   Target Date 11/18/16                Plan - 08/16/16 1913    Clinical Impression Statement This 85ZY underwent a Transtibial Amputation on 04/26/2016 and had prolonged healing due to fall opening residual limb. He  received his first prosthesis on 07/21/2016 and is dependent in safe use & care increasing risk for skin integrity & residual limb pain issues. He is wearing prosthesis 7-8 hrs of 15-16 awake hours with distal tibia pain 9/10. Patient has impaired standing balance with Berg Balance score 22/56 indicating dependency in ADLs & high fall risk. Patient is dependent in prosthetic gait with excessive UE weight bearing and gait deviations indicating fall risk. Patient was unknowledgeable in negotiating ramps, curbs & stairs and PT instructed in technique. He required minA & additional verbal cues. Patient would benefit from skilled PT to progress prosthetic wear, care & safe use.    History and Personal Factors relevant to plan of care: Rt TTA, ESRD, DM, HTN, peripheral neuropathy, cardiomyopathy, CHF, COPD, AVF sg, R total knee replacement 2007   Clinical Presentation Evolving   Clinical Decision Making Moderate   Rehab Potential Good   PT Frequency 2x / week   PT Duration 12 weeks   PT Treatment/Interventions ADLs/Self Care Home Management;DME  Instruction;Gait training;Stair training;Functional mobility training;Therapeutic activities;Therapeutic exercise;Balance training;Neuromuscular re-education;Patient/family education;Prosthetic Training   PT Next Visit Plan HEP at sink, review prosthetic care, prosthetic gait with RW including ramps, curbs.    Consulted and Agree with Plan of Care Patient;Family member/caregiver   Family Member Consulted wife      Patient will benefit from skilled therapeutic intervention in order to improve the following deficits and impairments:  Abnormal gait, Decreased activity tolerance, Decreased balance, Decreased knowledge of use of DME, Decreased mobility, Postural dysfunction, Prosthetic Dependency  Visit Diagnosis: Other abnormalities of gait and mobility  Unsteadiness on feet  Pain in right lower leg  Muscle weakness (generalized)  Repeated falls     Problem List Patient Active Problem List   Diagnosis Date Noted  . Onychomycosis 08/16/2016  . Status post unilateral below knee amputation, right ([place]) 04/26/2016  . Cutaneous abscess of right foot 04/19/2016  . Diabetic foot infection ([place])   . Diabetic foot ulcer ([place]) 03/30/2016  . Bilateral carotid bruits 03/30/2016  . Diabetes mellitus with complication ([place])   . Anemia due to chronic kidney disease   . ESRD (end stage renal disease) on dialysis ([place]) 11/30/2015  . Acute respiratory failure with hypoxia ([place]) 11/29/2015  . At high risk for falls 09/29/2014  . Peripheral vascular disease ([place]) 09/29/2014  . Osteoarthritis of both knees 01/07/2014  . Osteoarthritis of multiple joints 10/06/2013  . NICM (nonischemic cardiomyopathy) ([place]) 06/27/2013  . CAD (coronary artery disease) 03/13/2013  . Abnormal nuclear stress test 01/27/2013  . Chronic systolic congestive heart failure ([place]) 01/27/2013  . COPD (chronic obstructive pulmonary disease) ([place]) 12/27/2012  . Diabetic polyneuropathy associated with type 2 diabetes mellitus  ([place]) 02/10/2012  . PYELITIS/PYELONEPHRITIS DISEASES CLASSIFIED ELSW 09/28/2009  . URINARY CALCULUS 09/28/2009  . Adjustment disorder with depressed mood 08/21/2009  . Poorly controlled type II diabetes mellitus with renal complication ([place]) 63/84/6659  . Hyperlipidemia 04/14/2008  . Essential hypertension 04/14/2008  . ALLERGIC RHINITIS 04/14/2008    Jamey Reas PT, DPT 08/17/2016, 7:37 PM  [place] [address] [place], [place], [address] Phone: [phone]   Fax:  [phone]  Name: Anthony Bullock MRN: [id number] Date of Birth: December 05, 1960

## 2016-08-17 NOTE — Telephone Encounter (Signed)
LM for pt to return call - Dr Harl Bowie addressed this at [facility] - no records of [facility] contacting our office

## 2016-08-20 ENCOUNTER — Encounter: Payer: Self-pay | Admitting: Cardiology

## 2016-08-22 ENCOUNTER — Telehealth: Payer: Self-pay | Admitting: Cardiology

## 2016-08-22 NOTE — Telephone Encounter (Signed)
Pt called back with info of [facility]) [phone] (transplant department) office already closed for the day - will call again tomorrow and see exactly what is needed from [name]

## 2016-08-22 NOTE — Telephone Encounter (Signed)
Pt reached out through [employer]

## 2016-08-22 NOTE — Telephone Encounter (Signed)
LM to return call.

## 2016-08-22 NOTE — Telephone Encounter (Signed)
Anthony Bullock called in reference to papers that were to be sent to [facility] from [place]. States that the request was sent to [phone].([facility])   Please call [phone].

## 2016-08-30 ENCOUNTER — Ambulatory Visit: Payer: BLUE CROSS/BLUE SHIELD | Admitting: Physical Therapy

## 2016-08-30 ENCOUNTER — Encounter: Payer: Self-pay | Admitting: Physical Therapy

## 2016-08-30 DIAGNOSIS — R2681 Unsteadiness on feet: Secondary | ICD-10-CM

## 2016-08-30 DIAGNOSIS — R2689 Other abnormalities of gait and mobility: Secondary | ICD-10-CM

## 2016-08-30 DIAGNOSIS — M6281 Muscle weakness (generalized): Secondary | ICD-10-CM

## 2016-08-30 DIAGNOSIS — R296 Repeated falls: Secondary | ICD-10-CM

## 2016-08-30 NOTE — Patient Instructions (Signed)
Do each exercise 1-2  times per day Do each exercise 10 repetitions Hold each exercise for 5 seconds to feel your location  AT SINK FIND YOUR MIDLINE POSITION AND PLACE FEET EQUAL DISTANCE FROM THE MIDLINE.  USE TAPE ON FLOOR TO MARK THE MIDLINE POSITION. You also should try to feel with your limb pressure in socket.  You are trying to feel with limb what you used to feel with the bottom of your foot.  1. Side to Side Shift: Moving your hips only (not shoulders): move weight onto your left leg, HOLD/FEEL.  Move back to equal weight on each leg, HOLD/FEEL. Move weight onto your right leg, HOLD/FEEL. Move back to equal weight on each leg, HOLD/FEEL. Repeat. 2. Front to Back Shift: Moving your hips only (not shoulders): move your weight forward onto your toes, HOLD/FEEL. Move your weight back to equal Flat Foot on both legs, HOLD/FEEL. Move your weight back onto your heels, HOLD/FEEL. Move your weight back to equal on both legs, HOLD/FEEL. Repeat. 3. Moving Cones / Cups: With equal weight on each leg: Hold on with one hand the first time, then progress to no hand supports. Move cups from one side of sink to the other. Place cups ~2" out of your reach, progress to 10" beyond reach. 4. Overhead/Upward Reaching: alternated reaching up to top cabinets or ceiling if no cabinets present. Keep equal weight on each leg. Start with one hand support on counter while other hand reaches and progress to no hand support with reaching. 5.   Looking Over Shoulders: With equal weight on each leg: alternate turning to look          over your shoulders with one hand support on counter as needed. Shift weight to             side looking, pull hip then shoulder then head/eyes around to look behind you. Start       with one hand support & progress to no hand support. 

## 2016-09-01 ENCOUNTER — Telehealth: Payer: Self-pay | Admitting: *Deleted

## 2016-09-01 ENCOUNTER — Telehealth: Payer: Self-pay | Admitting: Cardiology

## 2016-09-01 DIAGNOSIS — R0602 Shortness of breath: Secondary | ICD-10-CM

## 2016-09-01 DIAGNOSIS — I5022 Chronic systolic (congestive) heart failure: Secondary | ICD-10-CM

## 2016-09-01 NOTE — Therapy (Signed)
[facility] [address] [place], [place], [address] Phone: [phone]   Fax:  [phone]  Physical Therapy Treatment  Patient Details  Name: Anthony Bullock MRN: [id number] Date of Birth: 1960-05-28 Referring Provider: Meridee Score, MD  Encounter Date: 08/30/2016     08/30/16 1408  PT Visits / Re-Eval  Visit Number 2  Number of Visits 25  Date for PT Re-Evaluation 11/18/16  Authorization  Authorization Type BCBS  Authorization Time Period $650 deductible met, 20% coinsurance, 60 visit comb limit, 0 authorization,   PT Time Calculation  PT Start Time 1405  PT Stop Time 1445  PT Time Calculation (min) 40 min  PT - End of Session  Equipment Utilized During Treatment Gait belt  Activity Tolerance Patient tolerated treatment well;Patient limited by pain  Behavior During Therapy [facility] for tasks assessed/performed    Past Medical History:  Diagnosis Date  . 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 Allergic rhinitis   . Anemia   . Arthritis   . Chronic systolic heart failure ([place])    a. ECHO (12/2012) EF 25-30%, HK entireanteroseptal myocardium //  b.  EF 25%, diffuse HK, grade 1 diastolic dysfunction, MAC, mild LAE, normal RVSF, trivial pericardial effusion  . COPD (chronic obstructive pulmonary disease) ([place])   . Diabetes mellitus type II   . Diabetic nephropathy ([place])   . Diabetic neuropathy ([place])   . ESRD on hemodialysis [facility])    started HD June 2017, goes to [facility] HD unit, Dr Hinda Lenis  . History of cardiac catheterization    a.Myoview 1/15:  There is significant left ventricular dysfunction. There may be slight scar at the apex. There is no significant ischemia. LV Ejection Fraction: 27%  //  b. RHC/LHC (1/15) with mean RA 6, PA 47/22 mean 33, mean PCWP 20, PVR 2.5 WU, CI 2.5; 80% dLAD stenosis, 70% diffuse large D.   . History of kidney stones   . Hyperlipidemia   .  Hypertension   . Kidney stones   . NICM (nonischemic cardiomyopathy) ([place])    Primarily nonischemic. Echo (12/14) with EF 25-30%. Echo (3/15) with EF 25%, mild to moderately dilated LV, normal RV size and systolic function.   . Pneumonia   . Urethral stricture     Past Surgical History:  Procedure Laterality Date  . ABDOMINAL AORTOGRAM W/LOWER EXTREMITY N/A 03/30/2016   Procedure: Abdominal Aortogram w/Lower Extremity;  Surgeon: Angelia Mould, MD;  Location: [place] CV LAB;  Service: Cardiovascular;  Laterality: N/A;  . AMPUTATION Right 04/26/2016   Procedure: Right Below Knee Amputation;  Surgeon: Newt Minion, MD;  Location: [place];  Service: Orthopedics;  Laterality: Right;  . AV FISTULA PLACEMENT Right 09/08/2015   Procedure: INSERTION OF 4-19m x 45cm  ARTERIOVENOUS (AV) GORE-TEX GRAFT RIGHT UPPER  ARM;  Surgeon: CAngelia Mould MD;  [place]  Service: Vascular;  Laterality: Right;  . AV FISTULA PLACEMENT Left 01/14/2016   Procedure: CREATION OF LEFT UPPER ARM ARTERIOVENOUS FISTULA;  Surgeon: CAngelia Mould MD;  [place]  Service: Vascular;  Laterality: Left;  . BASCILIC VEIN TRANSPOSITION Right 08/22/2014   Procedure: RIGHT UPPER ARM BASCILIC VEIN TRANSPOSITION;  Surgeon: CAngelia Mould MD;  [place]  Service: Vascular;  Laterality: Right;  . BELOW KNEE LEG AMPUTATION Right 04/26/2016  . CARDIAC CATHETERIZATION    . CARDIAC DEFIBRILLATOR PLACEMENT  06/27/2013   Sub Q  BY DR Caryl Comes  . COLONOSCOPY WITH PROPOFOL N/A 07/22/2015   Procedure: COLONOSCOPY WITH PROPOFOL;  Surgeon: Doran Stabler, MD;  Location: [facility];  Service: Gastroenterology;  Laterality: N/A;  . FEMORAL-POPLITEAL BYPASS GRAFT Right 03/31/2016   Procedure: BYPASS GRAFT FEMORAL-POPLITEAL ARTERY USING RIGHT GREATER SAPHENOUS NONREVERSED VEIN;  Surgeon: Angelia Mould, MD;  Location: [place];  Service: Vascular;  Laterality: Right;  . HERNIA REPAIR    .  I&D EXTREMITY Right 03/31/2016   Procedure: IRRIGATION AND DEBRIDEMENT FOOT;  Surgeon: Angelia Mould, MD;  Location: [place];  Service: Vascular;  Laterality: Right;  . IMPLANTABLE CARDIOVERTER DEFIBRILLATOR IMPLANT N/A 06/27/2013   Procedure: SUB Q ICD;  Surgeon: Deboraha Sprang, MD;  Location: [facility] CATH LAB;  Service: Cardiovascular;  Laterality: N/A;  . INTRAOPERATIVE ARTERIOGRAM Right 03/31/2016   Procedure: INTRA OPERATIVE ARTERIOGRAM;  Surgeon: Angelia Mould, MD;  Location: [place];  Service: Vascular;  Laterality: Right;  . IR GENERIC HISTORICAL Right 11/30/2015   IR THROMBECTOMY AV FISTULA W/THROMBOLYSIS/PTA INC/SHUNT/IMG RIGHT 11/30/2015 Aletta Edouard, MD [facility]  . IR GENERIC HISTORICAL  11/30/2015   IR US GUIDE VASC ACCESS RIGHT 11/30/2015 Aletta Edouard, MD [facility]  . IR GENERIC HISTORICAL Right 12/15/2015   IR THROMBECTOMY AV FISTULA W/THROMBOLYSIS/PTA/STENT INC/SHUNT/IMG RT 12/15/2015 Arne Cleveland, MD [facility]  . IR GENERIC HISTORICAL  12/15/2015   IR US GUIDE VASC ACCESS RIGHT 12/15/2015 Arne Cleveland, MD [facility]  . IR GENERIC HISTORICAL  12/28/2015   IR FLUORO GUIDE CV LINE RIGHT 12/28/2015 Marybelle Killings, MD [facility]  . IR GENERIC HISTORICAL  12/28/2015   IR US GUIDE VASC ACCESS RIGHT 12/28/2015 Marybelle Killings, MD [facility]  . LEFT A ND RIGHT HEART CATH  01/30/2013   DR Sung Amabile  . LEFT AND RIGHT HEART CATHETERIZATION WITH CORONARY ANGIOGRAM N/A 01/30/2013   Procedure: LEFT AND RIGHT HEART CATHETERIZATION WITH CORONARY ANGIOGRAM;  Surgeon: Jolaine Artist, MD;  Location: [facility] C[facility] LAB;  Service: Cardiovascular;  Laterality: N/A;  . PERIPHERAL VASCULAR CATHETERIZATION Right 01/26/2015   Procedure: A/V Fistulagram;  Surgeon: Angelia Mould, MD;  Location: [place] CV LAB;  Service: Cardiovascular;  Laterality: Right;  . reapea urethral surgery for recurrent obstruction  2011  . TOTAL KNEE ARTHROPLASTY Right 2007  . VEIN HARVEST Right  03/31/2016   Procedure: RIGHT GREATER SAPHENOUS VEIN HARVEST;  Surgeon: Angelia Mould, MD;  Location: [place];  Service: Vascular;  Laterality: Right;    There were no vitals filed for this visit.   08/30/16 1406  Symptoms/Limitations  Subjective No new complaints. No falls to report. Does have some pain at the tibial crest area of residual limb.  Patient is accompained by: Family member (spouse)  Pertinent History Rt TTA, ESRD, DM, HTN, peripheral neuropathy, cardiomyopathy, CHF, COPD, AVF sg, R total knee replacement 2007  Limitations Lifting;Standing;Walking;House hold activities  Patient Stated Goals Walk without assistance or an AD in the community  Pain Assessment  Currently in Pain? Yes  Pain Score 5  Pain Location Leg  Pain Orientation Right  Pain Descriptors / Indicators Sharp  Pain Type Chronic pain  Pain Onset More than a month ago  Pain Frequency Intermittent  Aggravating Factors  prosthesis wear and dialysis  Pain Relieving Factors rest, sitting down      08/30/16 1409  Transfers  Transfers Sit to Stand;Stand to Sit  Sit to Stand 5: Supervision;With upper extremity assist;With armrests;From chair/3-in-1  Stand to Sit 5: Supervision;With upper  extremity assist;With armrests;To chair/3-in-1  Ambulation/Gait  Ambulation/Gait Yes  Ambulation/Gait Assistance 5: Supervision  Ambulation/Gait Assistance Details cues on posture, step length and base of support with gait  Ambulation Distance (Feet) 150 Feet (x2)  Assistive device Rolling walker;Prosthesis  Gait Pattern Step-through pattern;Decreased stride length;Trunk flexed;Narrow base of support;Decreased stance time - right;Decreased step length - left  Ambulation Surface Level;Indoor  Stairs Yes  Stairs Assistance 4: Min guard  Stairs Assistance Details (indicate cue type and reason) min guard assist for step to pattern up/down stairs. edcuated and demo'd to pt reciprocal steps with prosthetic placement to  descend.  min assist with cues needed for pt demo.   Stair Management Technique Two rails;Step to pattern;Forwards;Alternating pattern  Number of Stairs 4 (x2)  Ramp Other (comment) (min guard assist with RW)  Ramp Details (indicate cue type and reason) cues on posture and step length  Curb Other (comment) (min guard assist with RW)  Curb Details (indicate cue type and reason) cues on stance position for improved balance with RW advancement  Prosthetics  Current prosthetic wear tolerance (days/week)  daily  Current prosthetic wear tolerance (#hours/day)  6-8 hours, less on dialysis days  Education Provided Residual limb care;Proper wear schedule/adjustment;Proper weight-bearing schedule/adjustment;Correct ply sock adjustment (cut off socks, use of baby oil )  Person(s) Educated Patient;Spouse  Education Method Explanation;Demonstration;Verbal cues;Handout  Education Method Verbalized understanding;Verbal cues required      08/30/16 1431  PT Education  Education provided Yes  Education Details HEP at sink for proprioception/balance/prosthetic weight bearing  Person(s) Educated Patient;Spouse  Methods Explanation;Demonstration;Verbal cues;Handout  Comprehension Verbalized understanding;Returned demonstration;Need further instruction;Verbal cues required           PT Short Term Goals - 08/16/16 1923      PT SHORT TERM GOAL #1   Title Patient tolerates prosthesis wear >10hrs total /day with no skin issues & limb pain </= 6/10    Time 4   Period Weeks   Status New   Target Date 09/22/16     PT SHORT TERM GOAL #2   Title Patient verbalizes proper cleaning, weighing & wearing at dialysis.    Time 4   Period Weeks   Status New   Target Date 09/22/16     PT SHORT TERM GOAL #3   Title Patient ambulates 300' with RW or crutches & prosthesis with supervision.    Time 4   Period Weeks   Status New   Target Date 09/22/16     PT SHORT TERM GOAL #4   Title Patient  negotiates ramps, curbs with RW or crutches & stairs with 1 rail with supervision.    Time 4   Period Weeks   Status New   Target Date 09/22/16     PT SHORT TERM GOAL #5   Title Patient picks up objects from floor without UE support safely.    Time 4   Period Weeks   Status New   Target Date 09/22/16           PT Long Term Goals - 08/16/16 1928      PT LONG TERM GOAL #1   Title Patient verbalizes proper prosthetic care including wear/use at dialysis to enable safe use of prosthesis.    Time 12   Period Weeks   Status New   Target Date 11/18/16     PT LONG TERM GOAL #2   Title Patient tolerates wear of prosthesis >90% of awake hours with no skin issues & limb  pain </= 2/10   Time 12   Period Weeks   Target Date 11/18/16     PT LONG TERM GOAL #3   Title Patient ambulates 3' around household furniture carrying plate & cup with prosthesis only modified independent.    Time 12   Period Weeks   Status New   Target Date 11/18/16     PT LONG TERM GOAL #4   Title Berg Balance >45/56 to indicate lower fall risk.    Time 12   Period Weeks   Status New   Target Date 11/18/16     PT LONG TERM GOAL #5   Title Patient ambulates 500' outdoors including grass with cane & prosthesis modified independent to enable community mobility.    Time 12   Period Weeks   Status New   Target Date 11/18/16     Additional Long Term Goals   Additional Long Term Goals Yes     PT LONG TERM GOAL #6   Title Patient negotiates ramps, curbs & stairs (1 rail) with cane & prosthesis modified independent for community access.    Time 12   Period Weeks   Status New   Target Date 11/18/16     PT LONG TERM GOAL #7   Title Patient able to lift/ carry 15# box, push /pull items & negotiate around/over ostacles with prosthesis modified independent for household tasks.    Time 12   Period Weeks   Status New   Target Date 11/18/16        08/30/16 1408  Plan  Clinical Impression Statement  Today's skilled session continued to address use of prosthesis with mobility. Also educated and issued sink HEP for balance and proprioception. Pt is making steady progress and should benefit from contiued PT to progress toward unmet goals.   Pt will benefit from skilled therapeutic intervention in order to improve on the following deficits Abnormal gait;Decreased activity tolerance;Decreased balance;Decreased knowledge of use of DME;Decreased mobility;Postural dysfunction;Prosthetic Dependency  Rehab Potential Good  PT Frequency 2x / week  PT Duration 12 weeks  PT Treatment/Interventions ADLs/Self Care Home Management;DME Instruction;Gait training;Stair training;Functional mobility training;Therapeutic activities;Therapeutic exercise;Balance training;Neuromuscular re-education;Patient/family education;Prosthetic Training  PT Next Visit Plan review prosthetic care, prosthetic gait with RW including ramps, curbs.   Consulted and Agree with Plan of Care Patient;Family member/caregiver  Family Member Consulted wife          Patient will benefit from skilled therapeutic intervention in order to improve the following deficits and impairments:  Abnormal gait, Decreased activity tolerance, Decreased balance, Decreased knowledge of use of DME, Decreased mobility, Postural dysfunction, Prosthetic Dependency  Visit Diagnosis: Other abnormalities of gait and mobility  Unsteadiness on feet  Muscle weakness (generalized)  Repeated falls     Problem List Patient Active Problem List   Diagnosis Date Noted  . Onychomycosis 08/16/2016  . Status post unilateral below knee amputation, right ([place]) 04/26/2016  . Cutaneous abscess of right foot 04/19/2016  . Diabetic foot infection ([place])   . Diabetic foot ulcer ([place]) 03/30/2016  . Bilateral carotid bruits 03/30/2016  . Diabetes mellitus with complication ([place])   . Anemia due to chronic kidney disease   . ESRD (end stage renal disease) on dialysis  ([place]) 11/30/2015  . Acute respiratory failure with hypoxia ([place]) 11/29/2015  . At high risk for falls 09/29/2014  . Peripheral vascular disease ([place]) 09/29/2014  . Osteoarthritis of both knees 01/07/2014  . Osteoarthritis of multiple joints 10/06/2013  . NICM (nonischemic cardiomyopathy) (  [place]) 06/27/2013  . CAD (coronary artery disease) 03/13/2013  . Abnormal nuclear stress test 01/27/2013  . Chronic systolic congestive heart failure ([place]) 01/27/2013  . COPD (chronic obstructive pulmonary disease) ([place]) 12/27/2012  . Diabetic polyneuropathy associated with type 2 diabetes mellitus ([place]) 02/10/2012  . PYELITIS/PYELONEPHRITIS DISEASES CLASSIFIED ELSW 09/28/2009  . URINARY CALCULUS 09/28/2009  . Adjustment disorder with depressed mood 08/21/2009  . Poorly controlled type II diabetes mellitus with renal complication ([place]) 54/00/8676  . Hyperlipidemia 04/14/2008  . Essential hypertension 04/14/2008  . ALLERGIC RHINITIS 04/14/2008    Willow Ora, PTA, [facility] [address], [place], [place] [address] [phone] 09/01/16, 1:21 AM   Name: LINDSEY HOMMEL MRN: [id number] Date of Birth: July 25, 1960

## 2016-09-01 NOTE — Addendum Note (Signed)
Addendum  created 09/01/16 1310 by Roberts Gaudy, MD   Sign clinical note

## 2016-09-01 NOTE — Telephone Encounter (Signed)
Pt aware that will we have these scheduled - patient needs appt on Monday Wednesday or Friday - takes dialysis on Tuesdays and Thursdays

## 2016-09-01 NOTE — Telephone Encounter (Signed)
-----   Message from Arnoldo Lenis, MD sent at 09/01/2016  9:33 AM EDT ----- Let patient know that we received request from [facility] for some tests to be done as part of his workup for kidney transplant. Please order an echo for chronic systolic heart failure, please order PFTs with ABG for SOB. Results will need to be faxed to Nelta Numbers at [phone]   Carlyle Dolly MD

## 2016-09-01 NOTE — Telephone Encounter (Signed)
Pre-cert Verification for the following procedure   PFT'S & ECHO scheduled for 09-09-16 at [facility]

## 2016-09-02 ENCOUNTER — Encounter: Payer: Self-pay | Admitting: *Deleted

## 2016-09-06 ENCOUNTER — Encounter: Payer: Self-pay | Admitting: Physical Therapy

## 2016-09-06 ENCOUNTER — Ambulatory Visit: Payer: BLUE CROSS/BLUE SHIELD | Admitting: Physical Therapy

## 2016-09-06 DIAGNOSIS — M79661 Pain in right lower leg: Secondary | ICD-10-CM

## 2016-09-06 DIAGNOSIS — R296 Repeated falls: Secondary | ICD-10-CM

## 2016-09-06 DIAGNOSIS — R2689 Other abnormalities of gait and mobility: Secondary | ICD-10-CM

## 2016-09-06 DIAGNOSIS — M6281 Muscle weakness (generalized): Secondary | ICD-10-CM

## 2016-09-06 DIAGNOSIS — R2681 Unsteadiness on feet: Secondary | ICD-10-CM

## 2016-09-07 NOTE — Therapy (Signed)
[facility] [address] [place] [address] [place], [place], [address] Phone: [phone]   Fax:  [phone]  Physical Therapy Treatment  Patient Details  Name: Anthony Bullock MRN: [id number] Date of Birth: [birth year] Referring Provider: Meridee Score, MD  Encounter Date: 09/06/2016      PT End of Session - 09/07/16 1308    Visit Number 3   Number of Visits 25   Date for PT Re-Evaluation 11/18/16   Authorization Type BCBS   Authorization Time Period $650 deductible met, 20% coinsurance, 60 visit comb limit, 0 authorization,    PT Start Time 1530   PT Stop Time 1611   PT Time Calculation (min) 41 min   Equipment Utilized During Treatment Gait belt   Activity Tolerance Patient tolerated treatment well   Behavior During Therapy WFL for tasks assessed/performed      Past Medical History:  Diagnosis Date  . 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 Allergic rhinitis   . Anemia   . Arthritis   . Chronic systolic heart failure ([place])    a. ECHO (12/2012) EF 25-30%, HK entireanteroseptal myocardium //  b.  EF 25%, diffuse HK, grade 1 diastolic dysfunction, MAC, mild LAE, normal RVSF, trivial pericardial effusion  . COPD (chronic obstructive pulmonary disease) ([place])   . Diabetes mellitus type II   . Diabetic nephropathy ([place])   . Diabetic neuropathy ([place])   . ESRD on hemodialysis [facility])    started HD June 2017, goes to [facility] HD unit, Dr Hinda Lenis  . History of cardiac catheterization    a.Myoview 1/15:  There is significant left ventricular dysfunction. There may be slight scar at the apex. There is no significant ischemia. LV Ejection Fraction: 27%  //  b. RHC/LHC (1/15) with mean RA 6, PA 47/22 mean 33, mean PCWP 20, PVR 2.5 WU, CI 2.5; 80% dLAD stenosis, 70% diffuse large D.   . History of kidney stones   . Hyperlipidemia   . Hypertension   . Kidney stones   . NICM (nonischemic cardiomyopathy)  ([place])    Primarily nonischemic. Echo (12/14) with EF 25-30%. Echo (3/15) with EF 25%, mild to moderately dilated LV, normal RV size and systolic function.   . Pneumonia   . Urethral stricture     Past Surgical History:  Procedure Laterality Date  . ABDOMINAL AORTOGRAM W/LOWER EXTREMITY N/A 03/30/2016   Procedure: Abdominal Aortogram w/Lower Extremity;  Surgeon: Angelia Mould, MD;  Location: [place] CV LAB;  Service: Cardiovascular;  Laterality: N/A;  . AMPUTATION Right 04/26/2016   Procedure: Right Below Knee Amputation;  Surgeon: Newt Minion, MD;  Location: [place];  Service: Orthopedics;  Laterality: Right;  . AV FISTULA PLACEMENT Right 09/08/2015   Procedure: INSERTION OF 4-40m x 45cm  ARTERIOVENOUS (AV) GORE-TEX GRAFT RIGHT UPPER  ARM;  Surgeon: CAngelia Mould MD;  [place]  Service: Vascular;  Laterality: Right;  . AV FISTULA PLACEMENT Left 01/14/2016   Procedure: CREATION OF LEFT UPPER ARM ARTERIOVENOUS FISTULA;  Surgeon: CAngelia Mould MD;  [place] [facility]  Service: Vascular;  Laterality: Left;  . BASCILIC VEIN TRANSPOSITION Right 08/22/2014   Procedure: RIGHT UPPER ARM BASCILIC VEIN TRANSPOSITION;  Surgeon: CAngelia Mould MD;  [place] [facility]  Service: Vascular;  Laterality: Right;  . BELOW KNEE LEG AMPUTATION Right 04/26/2016  . CARDIAC CATHETERIZATION    . CARDIAC DEFIBRILLATOR PLACEMENT  06/27/2013   Sub Q  BY DR Caryl Comes  . COLONOSCOPY WITH PROPOFOL N/A 07/22/2015   Procedure: COLONOSCOPY WITH PROPOFOL;  Surgeon: Doran Stabler, MD;  Location: [facility];  Service: Gastroenterology;  Laterality: N/A;  . FEMORAL-POPLITEAL BYPASS GRAFT Right 03/31/2016   Procedure: BYPASS GRAFT FEMORAL-POPLITEAL ARTERY USING RIGHT GREATER SAPHENOUS NONREVERSED VEIN;  Surgeon: Angelia Mould, MD;  Location: [place];  Service: Vascular;  Laterality: Right;  . HERNIA REPAIR    . I&D EXTREMITY Right 03/31/2016   Procedure: IRRIGATION AND DEBRIDEMENT  FOOT;  Surgeon: Angelia Mould, MD;  Location: [place];  Service: Vascular;  Laterality: Right;  . IMPLANTABLE CARDIOVERTER DEFIBRILLATOR IMPLANT N/A 06/27/2013   Procedure: SUB Q ICD;  Surgeon: Deboraha Sprang, MD;  Location: [facility] CATH LAB;  Service: Cardiovascular;  Laterality: N/A;  . INTRAOPERATIVE ARTERIOGRAM Right 03/31/2016   Procedure: INTRA OPERATIVE ARTERIOGRAM;  Surgeon: Angelia Mould, MD;  Location: [facility];  Service: Vascular;  Laterality: Right;  . IR GENERIC HISTORICAL Right 11/30/2015   IR THROMBECTOMY AV FISTULA W/THROMBOLYSIS/PTA INC/SHUNT/IMG RIGHT 11/30/2015 Aletta Edouard, MD [facility]  . IR GENERIC HISTORICAL  11/30/2015   IR US GUIDE VASC ACCESS RIGHT 11/30/2015 Aletta Edouard, MD [facility]  . IR GENERIC HISTORICAL Right 12/15/2015   IR THROMBECTOMY AV FISTULA W/THROMBOLYSIS/PTA/STENT INC/SHUNT/IMG RT 12/15/2015 Arne Cleveland, MD [facility]  . IR GENERIC HISTORICAL  12/15/2015   IR US GUIDE VASC ACCESS RIGHT 12/15/2015 Arne Cleveland, MD [facility]  . IR GENERIC HISTORICAL  12/28/2015   IR FLUORO GUIDE CV LINE RIGHT 12/28/2015 Marybelle Killings, MD [facility]  . IR GENERIC HISTORICAL  12/28/2015   IR US GUIDE VASC ACCESS RIGHT 12/28/2015 Marybelle Killings, MD [facility]  . LEFT A ND RIGHT HEART CATH  01/30/2013   DR Sung Amabile  . LEFT AND RIGHT HEART CATHETERIZATION WITH CORONARY ANGIOGRAM N/A 01/30/2013   Procedure: LEFT AND RIGHT HEART CATHETERIZATION WITH CORONARY ANGIOGRAM;  Surgeon: Jolaine Artist, MD;  Location: [facility] CATH LAB;  Service: Cardiovascular;  Laterality: N/A;  . PERIPHERAL VASCULAR CATHETERIZATION Right 01/26/2015   Procedure: A/V Fistulagram;  Surgeon: Angelia Mould, MD;  Location: [place] CV LAB;  Service: Cardiovascular;  Laterality: Right;  . reapea urethral surgery for recurrent obstruction  2011  . TOTAL KNEE ARTHROPLASTY Right 2007  . VEIN HARVEST Right 03/31/2016   Procedure: RIGHT GREATER SAPHENOUS VEIN HARVEST;  Surgeon:  Angelia Mould, MD;  Location: [place];  Service: Vascular;  Laterality: Right;    There were no vitals filed for this visit.      Subjective Assessment - 09/06/16 1537    Subjective He is wearing prosthesis from 9:30 am to midnight with removing ~30 minutes 2-3 times day. He verbalizes wearing & weighing at dialysis but his knee/leg gets stiff in chair.    Patient is accompained by: Family member   Pertinent History Rt TTA, ESRD, DM, HTN, peripheral neuropathy, cardiomyopathy, CHF, COPD, AVF sg, R total knee replacement 2007   Limitations Lifting;Standing;Walking;House hold activities   Patient Stated Goals Walk without assistance or an AD in the community   Currently in Pain? Yes   Pain Score 4    Pain Location Leg   Pain Orientation Right  distal tibia   Pain Descriptors / Indicators Sore   Pain Type Acute pain   Pain Onset More than a month ago   Pain Frequency Intermittent  [place] Adult PT Treatment/Exercise - 09/06/16 1530      Transfers   Transfers Sit to Stand;Stand to Sit   Sit to Stand 5: Supervision;With upper extremity assist;With armrests;From chair/3-in-1  to RW for stabilization   Sit to Stand Details (indicate cue type and reason) cues on engaging prosthesis and wt shift   Stand to Sit 5: Supervision;With upper extremity assist;With armrests;To chair/3-in-1  from RW   Stand to Sit Details verbal cues on technique maintain Center of mass over feet then sitting back     Ambulation/Gait   Ambulation/Gait Yes   Ambulation/Gait Assistance 5: Supervision   Ambulation/Gait Assistance Details demo & verbal cues on step thru equal length pattern with weight shift over prosthesis in stance and proper step width.    Ambulation Distance (Feet) 300 Feet  300' X 2   Assistive device Rolling walker;Prosthesis   Gait Pattern Step-through pattern;Decreased stride length;Trunk flexed;Narrow base of support;Decreased stance time -  right;Decreased step length - left   Ambulation Surface Indoor;Level   Stairs Yes   Stairs Assistance 5: Supervision   Stairs Assistance Details (indicate cue type and reason) demo & verbal cues on wt shift over prosthesis in stance. Progressed to reciprocal pattern with 2 rails with cues on technique.    Stair Management Technique Two rails;Step to pattern;Alternating pattern;Forwards   Number of Stairs 4  1 reps step-to & 4 reps reciprocal   Ramp 5: Supervision  RW & TTA prosthesis   Ramp Details (indicate cue type and reason) demo & verbal cues on proper wt shift over prosthesis in stance & upright posture   Curb 5: Supervision  RW & TTA prosthesis   Curb Details (indicate cue type and reason) verbal cues on technique for safety.     Prosthetics   Prosthetic Care Comments  During dialysis while seated in chair, okay to remove prosthesis & wear liner/socks for comfort. Then redonne prosthesis when ready to get up.  Signs of sweating with need to dry limb & liner. Recommendation to dry limb/liner q4 hrs or sooner if sweating.  Use of antiperspirant.  Wiping limb with wet clothe upon doffing prosthetic liner to avoid scratching with itching common when mildly sweaty limb hits air.  Instructed in adjusting ply socks with too few, too many & correct ply fit with proprioceptive feedback. Discussed may need more ply socks exiting dialysis compared to entering with volume changes.    Current prosthetic wear tolerance (days/week)  daily   Current prosthetic wear tolerance (#hours/day)  reports most of awake hours with 30 min breaks ~3 times /day.    Residual limb condition  No open wounds, no hair growth, mildly sweaty, scar not adhered, temp normal, color normal to rest of leg   Education Provided Skin check;Residual limb care;Correct ply sock adjustment;Proper Donning;Proper wear schedule/adjustment;Other (comment)  see prosthetic care comments   Person(s) Educated Patient;Spouse   Education  Method Explanation;Demonstration;Tactile cues;Verbal cues   Education Method Verbalized understanding;Returned demonstration;Tactile cues required;Verbal cues required;Needs further instruction                  PT Short Term Goals - 09/06/16 1808      PT SHORT TERM GOAL #1   Title Patient tolerates prosthesis wear >10hrs total /day with no skin issues & limb pain </= 6/10    Time 4   Period Weeks   Status On-going   Target Date 09/22/16     PT SHORT TERM GOAL #2   Title Patient  verbalizes proper cleaning, weighing & wearing at dialysis.    Time 4   Period Weeks   Status On-going   Target Date 09/22/16     PT SHORT TERM GOAL #3   Title Patient ambulates 300' with RW or crutches & prosthesis with supervision.    Time 4   Period Weeks   Status On-going   Target Date 09/22/16     PT SHORT TERM GOAL #4   Title Patient negotiates ramps, curbs with RW or crutches & stairs with 1 rail with supervision.    Time 4   Period Weeks   Status On-going   Target Date 09/22/16     PT SHORT TERM GOAL #5   Title Patient picks up objects from floor without UE support safely.    Time 4   Period Weeks   Status On-going   Target Date 09/22/16           PT Long Term Goals - 09/06/16 1909      PT LONG TERM GOAL #1   Title Patient verbalizes proper prosthetic care including wear/use at dialysis to enable safe use of prosthesis.    Time 12   Period Weeks   Status On-going   Target Date 11/18/16     PT LONG TERM GOAL #2   Title Patient tolerates wear of prosthesis >90% of awake hours with no skin issues & limb pain </= 2/10   Time 12   Period Weeks   Status On-going   Target Date 11/18/16     PT LONG TERM GOAL #3   Title Patient ambulates 51' around household furniture carrying plate & cup with prosthesis only modified independent.    Time 12   Period Weeks   Status On-going   Target Date 11/18/16     PT LONG TERM GOAL #4   Title Berg Balance >45/56 to indicate  lower fall risk.    Time 12   Period Weeks   Status On-going   Target Date 11/18/16     PT LONG TERM GOAL #5   Title Patient ambulates 500' outdoors including grass with cane & prosthesis modified independent to enable community mobility.    Time 12   Period Weeks   Status On-going   Target Date 11/18/16     PT LONG TERM GOAL #6   Title Patient negotiates ramps, curbs & stairs (1 rail) with cane & prosthesis modified independent for community access.    Time 12   Period Weeks   Status On-going   Target Date 11/18/16     PT LONG TERM GOAL #7   Title Patient able to lift/ carry 15# box, push /pull items & negotiate around/over ostacles with prosthesis modified independent for household tasks.    Time 12   Period Weeks   Status On-going   Target Date 11/18/16               Plan - 09/06/16 1910    Clinical Impression Statement Patient has better understanding of adjusting ply socks and weight bearing straight down thru prosthesis in stance. These changes seem to have improved his distal tibia pressure / pain.    Rehab Potential Good   PT Frequency 2x / week   PT Duration 12 weeks   PT Treatment/Interventions ADLs/Self Care Home Management;DME Instruction;Gait training;Stair training;Functional mobility training;Therapeutic activities;Therapeutic exercise;Balance training;Neuromuscular re-education;Patient/family education;Prosthetic Training   PT Next Visit Plan  review prosthetic care, prosthetic gait with RW including ramps, curbs.  Consulted and Agree with Plan of Care Patient;Family member/caregiver   Family Member Consulted wife      Patient will benefit from skilled therapeutic intervention in order to improve the following deficits and impairments:  Abnormal gait, Decreased activity tolerance, Decreased balance, Decreased knowledge of use of DME, Decreased mobility, Postural dysfunction, Prosthetic Dependency  Visit Diagnosis: Other abnormalities of gait and  mobility  Unsteadiness on feet  Muscle weakness (generalized)  Repeated falls  Pain in right lower leg     Problem List Patient Active Problem List   Diagnosis Date Noted  . Onychomycosis 08/16/2016  . Status post unilateral below knee amputation, right ([place]) 04/26/2016  . Cutaneous abscess of right foot 04/19/2016  . Diabetic foot infection ([place])   . Diabetic foot ulcer ([place]) 03/30/2016  . Bilateral carotid bruits 03/30/2016  . Diabetes mellitus with complication ([place])   . Anemia due to chronic kidney disease   . ESRD (end stage renal disease) on dialysis ([place]) 11/30/2015  . Acute respiratory failure with hypoxia ([place]) 11/29/2015  . At high risk for falls 09/29/2014  . Peripheral vascular disease ([place]) 09/29/2014  . Osteoarthritis of both knees 01/07/2014  . Osteoarthritis of multiple joints 10/06/2013  . NICM (nonischemic cardiomyopathy) ([place]) 06/27/2013  . CAD (coronary artery disease) 03/13/2013  . Abnormal nuclear stress test 01/27/2013  . Chronic systolic congestive heart failure ([place]) 01/27/2013  . COPD (chronic obstructive pulmonary disease) ([place]) 12/27/2012  . Diabetic polyneuropathy associated with type 2 diabetes mellitus ([place]) 02/10/2012  . PYELITIS/PYELONEPHRITIS DISEASES CLASSIFIED ELSW 09/28/2009  . URINARY CALCULUS 09/28/2009  . Adjustment disorder with depressed mood 08/21/2009  . Poorly controlled type II diabetes mellitus with renal complication ([place]) 36/64/4034  . Hyperlipidemia 04/14/2008  . Essential hypertension 04/14/2008  . ALLERGIC RHINITIS 04/14/2008    Jamey Reas PT, DPT 09/07/2016, 1:12 PM  [facility] [address] [place], [place], [address] Phone: [phone]   Fax:  [phone]  Name: Anthony Bullock MRN: [id number] Date of Birth: 20-Sep-1960

## 2016-09-08 ENCOUNTER — Ambulatory Visit: Payer: BLUE CROSS/BLUE SHIELD | Admitting: Physical Therapy

## 2016-09-08 ENCOUNTER — Encounter: Payer: Self-pay | Admitting: Physical Therapy

## 2016-09-08 DIAGNOSIS — R2689 Other abnormalities of gait and mobility: Secondary | ICD-10-CM

## 2016-09-08 DIAGNOSIS — R2681 Unsteadiness on feet: Secondary | ICD-10-CM

## 2016-09-08 DIAGNOSIS — M6281 Muscle weakness (generalized): Secondary | ICD-10-CM

## 2016-09-08 DIAGNOSIS — R296 Repeated falls: Secondary | ICD-10-CM

## 2016-09-09 ENCOUNTER — Ambulatory Visit (HOSPITAL_COMMUNITY)
Admission: RE | Admit: 2016-09-09 | Discharge: 2016-09-09 | Disposition: A | Discharge status: Home / Self Care | Payer: BLUE CROSS/BLUE SHIELD | Source: Ambulatory Visit | Attending: Cardiology | Admitting: Cardiology

## 2016-09-09 DIAGNOSIS — R0602 Shortness of breath: Secondary | ICD-10-CM | POA: Diagnosis present

## 2016-09-09 DIAGNOSIS — I071 Rheumatic tricuspid insufficiency: Secondary | ICD-10-CM | POA: Insufficient documentation

## 2016-09-09 DIAGNOSIS — I5022 Chronic systolic (congestive) heart failure: Secondary | ICD-10-CM | POA: Diagnosis not present

## 2016-09-09 LAB — ECHOCARDIOGRAM COMPLETE
E decel time: 254 msec
E/e' ratio: 10.19
FS: 31 % (ref 28–44)
IVS/LV PW RATIO, ED: 0.86
LA ID, A-P, ES: 35 mm
LA diam end sys: 35 mm
LA diam index: 1.73 cm/m2
LA vol A4C: 50.3 ml
LA vol index: 24.3 mL/m2
LA vol: 49.2 mL
LV E/e' medial: 10.19
LV E/e'average: 10.19
LV PW d: 14.7 mm — AB (ref 0.6–1.1)
LV e' LATERAL: 5.66 cm/s
LVOT area: 3.46 cm2
LVOT diameter: 21 mm
Lateral S' vel: 15.1 cm/s
MV Dec: 254
MV pk A vel: 119 m/s
MV pk E vel: 57.7 m/s
TAPSE: 24.2 mm
TDI e' lateral: 5.66
TDI e' medial: 3.81

## 2016-09-09 MED ORDER — ALBUTEROL SULFATE (2.5 MG/3ML) 0.083% IN NEBU
2.5000 mg | INHALATION_SOLUTION | Freq: Once | RESPIRATORY_TRACT | Status: AC
  Administered 2016-09-09: 2.5 mg via RESPIRATORY_TRACT

## 2016-09-09 NOTE — Therapy (Signed)
[facility] [address] [place], [place], [address] Phone: [phone]   Fax:  [phone]  Physical Therapy Treatment  Patient Details  Name: Anthony Bullock MRN: [id number] Date of Birth: 1960/04/02 Referring Provider: Meridee Score, MD  Encounter Date: 09/08/2016      PT End of Session - 09/08/16 1452    Visit Number 4   Number of Visits 25   Date for PT Re-Evaluation 11/18/16   Authorization Type BC[employer]   Authorization Time Period $650 deductible met, 20% coinsurance, 60 visit comb limit, 0 authorization,    PT Start Time 1449   PT Stop Time 1530   PT Time Calculation (min) 41 min   Equipment Utilized During Treatment Gait belt   Activity Tolerance Patient tolerated treatment well   Behavior During Therapy WFL for tasks assessed/performed      Past Medical History:  Diagnosis Date  . 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 Allergic rhinitis   . Anemia   . Arthritis   . Chronic systolic heart failure ([place])    a. ECHO (12/2012) EF 25-30%, HK entireanteroseptal myocardium //  b.  EF 25%, diffuse HK, grade 1 diastolic dysfunction, MAC, mild LAE, normal RVSF, trivial pericardial effusion  . COPD (chronic obstructive pulmonary disease) ([place])   . Diabetes mellitus type II   . Diabetic nephropathy ([place])   . Diabetic neuropathy ([place])   . ESRD on hemodialysis [facility])    started HD June 2017, goes to [facility] HD unit, Dr Hinda Lenis  . History of cardiac catheterization    a.Myoview 1/15:  There is significant left ventricular dysfunction. There may be slight scar at the apex. There is no significant ischemia. LV Ejection Fraction: 27%  //  b. RHC/LHC (1/15) with mean RA 6, PA 47/22 mean 33, mean PCWP 20, PVR 2.5 WU, CI 2.5; 80% dLAD stenosis, 70% diffuse large D.   . History of kidney stones   . Hyperlipidemia   . Hypertension   . Kidney stones   . NICM (nonischemic cardiomyopathy)  ([place])    Primarily nonischemic. Echo (12/14) with EF 25-30%. Echo (3/15) with EF 25%, mild to moderately dilated LV, normal RV size and systolic function.   . Pneumonia   . Urethral stricture     Past Surgical History:  Procedure Laterality Date  . ABDOMINAL AORTOGRAM W/LOWER EXTREMITY N/A 03/30/2016   Procedure: Abdominal Aortogram w/Lower Extremity;  Surgeon: Angelia Mould, MD;  Location: [facility];  Service: Cardiovascular;  Laterality: N/A;  . AMPUTATION Right 04/26/2016   Procedure: Right Below Knee Amputation;  Surgeon: Newt Minion, MD;  Location: [facility];  Service: Orthopedics;  Laterality: Right;  . AV FISTULA PLACEMENT Right 09/08/2015   Procedure: INSERTION OF 4-64m x 45cm  ARTERIOVENOUS (AV) GORE-TEX GRAFT RIGHT UPPER  ARM;  Surgeon: CAngelia Mould MD;  [facility]  Service: Vascular;  Laterality: Right;  . AV FISTULA PLACEMENT Left 01/14/2016   Procedure: CREATION OF LEFT UPPER ARM ARTERIOVENOUS FISTULA;  Surgeon: CAngelia Mould MD;  [facility]  Service: Vascular;  Laterality: Left;  . BASCILIC VEIN TRANSPOSITION Right 08/22/2014   Procedure: RIGHT UPPER ARM BASCILIC VEIN TRANSPOSITION;  Surgeon: CAngelia Mould MD;  [facility]  Service: Vascular;  Laterality: Right;  . BELOW KNEE LEG AMPUTATION Right 04/26/2016  . CARDIAC CATHETERIZATION    . CARDIAC DEFIBRILLATOR PLACEMENT  06/27/2013   Sub Q  BY DR Caryl Comes  . COLONOSCOPY WITH PROPOFOL N/A 07/22/2015   Procedure: COLONOSCOPY WITH PROPOFOL;  Surgeon: Doran Stabler, MD;  Location: [facility];  Service: Gastroenterology;  Laterality: N/A;  . FEMORAL-POPLITEAL BYPASS GRAFT Right 03/31/2016   Procedure: BYPASS GRAFT FEMORAL-POPLITEAL ARTERY USING RIGHT GREATER SAPHENOUS NONREVERSED VEIN;  Surgeon: Angelia Mould, MD;  Location: [facility];  Service: Vascular;  Laterality: Right;  . HERNIA REPAIR    . I&D EXTREMITY Right 03/31/2016   Procedure: IRRIGATION AND DEBRIDEMENT  FOOT;  Surgeon: Angelia Mould, MD;  Location: [facility];  Service: Vascular;  Laterality: Right;  . IMPLANTABLE CARDIOVERTER DEFIBRILLATOR IMPLANT N/A 06/27/2013   Procedure: SUB Q ICD;  Surgeon: Deboraha Sprang, MD;  Location: [facility] CATH LAB;  Service: Cardiovascular;  Laterality: N/A;  . INTRAOPERATIVE ARTERIOGRAM Right 03/31/2016   Procedure: INTRA OPERATIVE ARTERIOGRAM;  Surgeon: Angelia Mould, MD;  Location: [facility];  Service: Vascular;  Laterality: Right;  . IR GENERIC HISTORICAL Right 11/30/2015   IR THROMBECTOMY AV FISTULA W/THROMBOLYSIS/PTA INC/SHUNT/IMG RIGHT 11/30/2015 Aletta Edouard, MD [facility]  . IR GENERIC HISTORICAL  11/30/2015   IR US GUIDE VASC ACCESS RIGHT 11/30/2015 Aletta Edouard, MD [facility]  . IR GENERIC HISTORICAL Right 12/15/2015   IR THROMBECTOMY AV FISTULA W/THROMBOLYSIS/PTA/STENT INC/SHUNT/IMG RT 12/15/2015 Arne Cleveland, MD [facility]  . IR GENERIC HISTORICAL  12/15/2015   IR US GUIDE VASC ACCESS RIGHT 12/15/2015 Arne Cleveland, MD [facility]  . IR GENERIC HISTORICAL  12/28/2015   IR FLUORO GUIDE CV LINE RIGHT 12/28/2015 Marybelle Killings, MD [facility]  . IR GENERIC HISTORICAL  12/28/2015   IR US GUIDE VASC ACCESS RIGHT 12/28/2015 Marybelle Killings, MD [facility]  . LEFT A ND RIGHT HEART CATH  01/30/2013   DR Sung Amabile  . LEFT AND RIGHT HEART CATHETERIZATION WITH CORONARY ANGIOGRAM N/A 01/30/2013   Procedure: LEFT AND RIGHT HEART CATHETERIZATION WITH CORONARY ANGIOGRAM;  Surgeon: Jolaine Artist, MD;  Location[facility] C[facility] LAB;  Service: Cardiovascular;  Laterality: N/A;  . PERIPHERAL VASCULAR CATHETERIZATION Right 01/26/2015   Procedure: A/V Fistulagram;  Surgeon: Angelia Mould, MD;  Location: [facility];  Service: Cardiovascular;  Laterality: Right;  . reapea urethral surgery for recurrent obstruction  2011  . TOTAL KNEE ARTHROPLASTY Right 2007  . VEIN HARVEST Right 03/31/2016   Procedure: RIGHT GREATER SAPHENOUS VEIN HARVEST;  Surgeon:  Angelia Mould, MD;  Location: [facility];  Service: Vascular;  Laterality: Right;    There were no vitals filed for this visit.      Subjective Assessment - 09/08/16 1451    Subjective No new complaints. No falls to report. Still having some shin pain on right limb.    Patient is accompained by: Family member   Pertinent History Rt TTA, ESRD, DM, HTN, peripheral neuropathy, cardiomyopathy, CHF, COPD, AVF sg, R total knee replacement 2007   Limitations Lifting;Standing;Walking;House hold activities   Patient Stated Goals Walk without assistance or an AD in the community   Currently in Pain? No/denies   Pain Score 0-No pain            [facility] Adult PT Treatment/Exercise - 09/08/16 1453      Transfers   Transfers Sit to Stand;Stand to Sit   Sit to Stand 5: Supervision;With upper extremity assist;With armrests;From chair/3-in-1   Stand to Sit 5: Supervision;With upper extremity assist;With armrests;To chair/3-in-1     Ambulation/Gait   Ambulation/Gait Yes   Ambulation/Gait Assistance 5: Supervision   Ambulation/Gait  Assistance Details cues on posture and equal step/weight bearing with RW; cues on posture, weight shifting and sequencing with cane. Pt progressed from min assist to supervision with cane. Advised pt at this level he would be okay to use cane in home and continue with RW in community.                            Ambulation Distance (Feet) 450 Feet  x1 RW; 115 x1, 20-50 ft x4 with 180* turns w/cane   Assistive device Rolling walker;Prosthesis;Straight cane  rubber quad tip on cane   Gait Pattern Step-through pattern;Trunk flexed;Narrow base of support;Decreased stance time - right;Decreased step length - left   Ambulation Surface Level;Indoor   Stairs Yes   Stairs Assistance 5: Supervision   Stairs Assistance Details (indicate cue type and reason) cues on weight shifting and hand advancement on rails   Stair Management Technique Alternating pattern;Two rails;Forwards    Number of Stairs 4   Ramp 5: Supervision   Ramp Details (indicate cue type and reason) with RW, no cues or assistance needed   Curb 5: Supervision   Curb Details (indicate cue type and reason) with RW with no cues or assistance needed     High Level Balance   High Level Balance Activities Side stepping;Marching forwards;Marching backwards;Tandem walking  tandem fwd/bwd   High Level Balance Comments in parallel bars for safety with intermittent UE touch to bars: 3 laps each with cues on posture, weight shifting and ex form/technique. performed 3 laps each /each way with min guard assist to min assist for balance.                            Neuro Re-ed    Neuro Re-ed Details  anterior/posterior direction on balance board: EO     Prosthetics   Prosthetic Care Comments  Report the heat rash has decreased with use of sock liner at top   Current prosthetic wear tolerance (days/week)  daily   Current prosthetic wear tolerance (#hours/day)  reports most of awake hours with 30 min breaks ~3 times /day.    Residual limb condition  intact per pt report            PT Short Term Goals - 09/06/16 1808      PT SHORT TERM GOAL #1   Title Patient tolerates prosthesis wear >10hrs total /day with no skin issues & limb pain </= 6/10    Time 4   Period Weeks   Status On-going   Target Date 09/22/16     PT SHORT TERM GOAL #2   Title Patient verbalizes proper cleaning, weighing & wearing at dialysis.    Time 4   Period Weeks   Status On-going   Target Date 09/22/16     PT SHORT TERM GOAL #3   Title Patient ambulates 300' with RW or crutches & prosthesis with supervision.    Time 4   Period Weeks   Status On-going   Target Date 09/22/16     PT SHORT TERM GOAL #4   Title Patient negotiates ramps, curbs with RW or crutches & stairs with 1 rail with supervision.    Time 4   Period Weeks   Status On-going   Target Date 09/22/16     PT SHORT TERM GOAL #5   Title Patient picks up  objects from floor without UE support safely.  Time 4   Period Weeks   Status On-going   Target Date 09/22/16           PT Long Term Goals - 09/06/16 1909      PT LONG TERM GOAL #1   Title Patient verbalizes proper prosthetic care including wear/use at dialysis to enable safe use of prosthesis.    Time 12   Period Weeks   Status On-going   Target Date 11/18/16     PT LONG TERM GOAL #2   Title Patient tolerates wear of prosthesis >90% of awake hours with no skin issues & limb pain </= 2/10   Time 12   Period Weeks   Status On-going   Target Date 11/18/16     PT LONG TERM GOAL #3   Title Patient ambulates 19' around household furniture carrying plate & cup with prosthesis only modified independent.    Time 12   Period Weeks   Status On-going   Target Date 11/18/16     PT LONG TERM GOAL #4   Title Berg Balance >45/56 to indicate lower fall risk.    Time 12   Period Weeks   Status On-going   Target Date 11/18/16     PT LONG TERM GOAL #5   Title Patient ambulates 500' outdoors including grass with cane & prosthesis modified independent to enable community mobility.    Time 12   Period Weeks   Status On-going   Target Date 11/18/16     PT LONG TERM GOAL #6   Title Patient negotiates ramps, curbs & stairs (1 rail) with cane & prosthesis modified independent for community access.    Time 12   Period Weeks   Status On-going   Target Date 11/18/16     PT LONG TERM GOAL #7   Title Patient able to lift/ carry 15# box, push /pull items & negotiate around/over ostacles with prosthesis modified independent for household tasks.    Time 12   Period Weeks   Status On-going   Target Date 11/18/16            Plan - 09/08/16 1452    Clinical Impression Statement Today's skilled session addressed gait with prosthesis progressing from RW down to straight cane with cues/assistance. Also began to address balance training this session as well. Pt is progressing toward  goals and should benefit from continued PT to progress toward unmet goals.    Rehab Potential Good   PT Frequency 2x / week   PT Duration 12 weeks   PT Treatment/Interventions ADLs/Self Care Home Management;DME Instruction;Gait training;Stair training;Functional mobility training;Therapeutic activities;Therapeutic exercise;Balance training;Neuromuscular re-education;Patient/family education;Prosthetic Training   PT Next Visit Plan continue to work on gait with cane, introduce barriers with cane and outdoor surfaces as weather allows, continue to work on balance reactions as well   Consulted and Agree with Plan of Care Patient;Family member/caregiver   Family Member Consulted wife      Patient will benefit from skilled therapeutic intervention in order to improve the following deficits and impairments:  Abnormal gait, Decreased activity tolerance, Decreased balance, Decreased knowledge of use of DME, Decreased mobility, Postural dysfunction, Prosthetic Dependency  Visit Diagnosis: Other abnormalities of gait and mobility  Unsteadiness on feet  Muscle weakness (generalized)  Repeated falls     Problem List Patient Active Problem List   Diagnosis Date Noted  . Onychomycosis 08/16/2016  . Status post unilateral below knee amputation, right ([place]) 04/26/2016  . Cutaneous abscess of right foot  04/19/2016  . Diabetic foot infection ([place])   . Diabetic foot ulcer ([place]) 03/30/2016  . Bilateral carotid bruits 03/30/2016  . Diabetes mellitus with complication ([place])   . Anemia due to chronic kidney disease   . ESRD (end stage renal disease) on dialysis ([place]) 11/30/2015  . Acute respiratory failure with hypoxia ([place]) 11/29/2015  . At high risk for falls 09/29/2014  . Peripheral vascular disease ([place]) 09/29/2014  . Osteoarthritis of both knees 01/07/2014  . Osteoarthritis of multiple joints 10/06/2013  . NICM (nonischemic cardiomyopathy) ([place]) 06/27/2013  . CAD (coronary artery disease)  03/13/2013  . Abnormal nuclear stress test 01/27/2013  . Chronic systolic congestive heart failure ([place]) 01/27/2013  . COPD (chronic obstructive pulmonary disease) ([place]) 12/27/2012  . Diabetic polyneuropathy associated with type 2 diabetes mellitus ([place]) 02/10/2012  . PYELITIS/PYELONEPHRITIS DISEASES CLASSIFIED ELSW 09/28/2009  . URINARY CALCULUS 09/28/2009  . Adjustment disorder with depressed mood 08/21/2009  . Poorly controlled type II diabetes mellitus with renal complication ([place]) 11/06/2534  . Hyperlipidemia 04/14/2008  . Essential hypertension 04/14/2008  . ALLERGIC RHINITIS 04/14/2008    Willow Ora, PTA, [facility] [address], [place], [place] [address] [phone] 09/09/16, 10:00 PM   Name: Anthony Bullock MRN: [id number] Date of Birth: May 17, 1960

## 2016-09-09 NOTE — Progress Notes (Signed)
*  PRELIMINARY RESULTS* Echocardiogram 2D Echocardiogram has been performed.  Anthony Bullock 09/09/2016, 12:57 PM

## 2016-09-11 LAB — PULMONARY FUNCTION TEST
DL/VA % pred: 63 %
DL/VA: 2.94 ml/min/mmHg/L
DLCO cor % pred: 47 %
DLCO cor: 15.46 ml/min/mmHg
DLCO unc % pred: 47 %
DLCO unc: 15.46 ml/min/mmHg
FEF 25-75 Post: 2.59 L/s
FEF 25-75 Pre: 2.3 L/s
FEF2575-%Change-Post: 12 %
FEF2575-%Pred-Post: 80 %
FEF2575-%Pred-Pre: 71 %
FEV1-%Change-Post: 2 %
FEV1-%Pred-Post: 71 %
FEV1-%Pred-Pre: 70 %
FEV1-Post: 2.71 L
FEV1-Pre: 2.65 L
FEV1FVC-%Change-Post: 4 %
FEV1FVC-%Pred-Pre: 101 %
FEV6-%Change-Post: -2 %
FEV6-%Pred-Post: 70 %
FEV6-%Pred-Pre: 72 %
FEV6-Post: 3.33 L
FEV6-Pre: 3.4 L
FEV6FVC-%Pred-Post: 104 %
FEV6FVC-%Pred-Pre: 104 %
FVC-%Change-Post: -2 %
FVC-%Pred-Post: 67 %
FVC-%Pred-Pre: 69 %
FVC-Post: 3.33 L
FVC-Pre: 3.4 L
Post FEV1/FVC ratio: 81 %
Post FEV6/FVC ratio: 100 %
Pre FEV1/FVC ratio: 78 %
Pre FEV6/FVC Ratio: 100 %
RV % pred: 93 %
RV: 2 L
TLC % pred: 70 %
TLC: 4.92 L

## 2016-09-13 ENCOUNTER — Ambulatory Visit: Payer: BLUE CROSS/BLUE SHIELD | Attending: Family | Admitting: Physical Therapy

## 2016-09-13 ENCOUNTER — Encounter: Payer: Self-pay | Admitting: Physical Therapy

## 2016-09-13 DIAGNOSIS — M79661 Pain in right lower leg: Secondary | ICD-10-CM | POA: Diagnosis present

## 2016-09-13 DIAGNOSIS — M6281 Muscle weakness (generalized): Secondary | ICD-10-CM | POA: Diagnosis present

## 2016-09-13 DIAGNOSIS — R2681 Unsteadiness on feet: Secondary | ICD-10-CM | POA: Diagnosis present

## 2016-09-13 DIAGNOSIS — R296 Repeated falls: Secondary | ICD-10-CM | POA: Diagnosis present

## 2016-09-13 DIAGNOSIS — R2689 Other abnormalities of gait and mobility: Secondary | ICD-10-CM | POA: Insufficient documentation

## 2016-09-14 NOTE — Therapy (Signed)
[facility] [address] [place], [place], [address] Phone: [phone]   Fax:  [phone]  Physical Therapy Treatment  Patient Details  Name: Anthony Bullock MRN: [id number] Date of Birth: 05-09-1960 Referring Provider: Meridee Score, MD  Encounter Date: 09/13/2016   09/13/16 1322  PT Visits / Re-Eval  Visit Number 5  Number of Visits 25  Date for PT Re-Evaluation 11/18/16  Authorization  Authorization Type [id number]  Authorization Time Period $650 deductible met, 20% coinsurance, 60 visit comb limit, 0 authorization,   PT Time Calculation  PT Start Time 1319  PT Stop Time 1400  PT Time Calculation (min) 41 min  PT - End of Session  Equipment Utilized During Treatment Gait belt  Activity Tolerance Patient tolerated treatment well  Behavior During Therapy [facility] for tasks assessed/performed     Past Medical History:  Diagnosis Date  . 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 Allergic rhinitis   . Anemia   . Arthritis   . Chronic systolic heart failure ([place])    a. ECHO (12/2012) EF 25-30%, HK entireanteroseptal myocardium //  b.  EF 25%, diffuse HK, grade 1 diastolic dysfunction, MAC, mild LAE, normal RVSF, trivial pericardial effusion  . COPD (chronic obstructive pulmonary disease) ([place])   . Diabetes mellitus type II   . Diabetic nephropathy ([place])   . Diabetic neuropathy ([place])   . ESRD on hemodialysis [facility])    started HD June 2017, goes to [facility] HD unit, Dr Hinda Lenis  . History of cardiac catheterization    a.Myoview 1/15:  There is significant left ventricular dysfunction. There may be slight scar at the apex. There is no significant ischemia. LV Ejection Fraction: 27%  //  b. RHC/LHC (1/15) with mean RA 6, PA 47/22 mean 33, mean PCWP 20, PVR 2.5 WU, CI 2.5; 80% dLAD stenosis, 70% diffuse large D.   . History of kidney stones   . Hyperlipidemia   . Hypertension   . Kidney  stones   . NICM (nonischemic cardiomyopathy) ([place])    Primarily nonischemic. Echo (12/14) with EF 25-30%. Echo (3/15) with EF 25%, mild to moderately dilated LV, normal RV size and systolic function.   . Pneumonia   . Urethral stricture     Past Surgical History:  Procedure Laterality Date  . ABDOMINAL AORTOGRAM W/LOWER EXTREMITY N/A 03/30/2016   Procedure: Abdominal Aortogram w/Lower Extremity;  Surgeon: Angelia Mould, MD;  Location: [place] CV [facility];  Service: Cardiovascular;  Laterality: N/A;  . AMPUTATION Right 04/26/2016   Procedure: Right Below Knee Amputation;  Surgeon: Newt Minion, MD;  Location: [place];  Service: Orthopedics;  Laterality: Right;  . AV FISTULA PLACEMENT Right 09/08/2015   Procedure: INSERTION OF 4-75m x 45cm  ARTERIOVENOUS (AV) GORE-TEX GRAFT RIGHT UPPER  ARM;  Surgeon: CAngelia Mould MD;  [place]  Service: Vascular;  Laterality: Right;  . AV FISTULA PLACEMENT Left 01/14/2016   Procedure: CREATION OF LEFT UPPER ARM ARTERIOVENOUS FISTULA;  Surgeon: CAngelia Mould MD;  [place]  Service: Vascular;  Laterality: Left;  . BASCILIC VEIN TRANSPOSITION Right 08/22/2014   Procedure: RIGHT UPPER ARM BASCILIC VEIN TRANSPOSITION;  Surgeon: CAngelia Mould MD;  [place]  Service: Vascular;  Laterality: Right;  . BELOW KNEE LEG AMPUTATION Right 04/26/2016  . CARDIAC CATHETERIZATION    . CARDIAC DEFIBRILLATOR PLACEMENT  06/27/2013   Sub Q       BY  DR Caryl Comes  . COLONOSCOPY WITH PROPOFOL N/A 07/22/2015   Procedure: COLONOSCOPY WITH PROPOFOL;  Surgeon: Doran Stabler, MD;  Location: [facility];  Service: Gastroenterology;  Laterality: N/A;  . FEMORAL-POPLITEAL BYPASS GRAFT Right 03/31/2016   Procedure: BYPASS GRAFT FEMORAL-POPLITEAL ARTERY USING RIGHT GREATER SAPHENOUS NONREVERSED VEIN;  Surgeon: Angelia Mould, MD;  Location: [place];  Service: Vascular;  Laterality: Right;  . HERNIA REPAIR    . I&D EXTREMITY Right  03/31/2016   Procedure: IRRIGATION AND DEBRIDEMENT FOOT;  Surgeon: Angelia Mould, MD;  Location: [place];  Service: Vascular;  Laterality: Right;  . IMPLANTABLE CARDIOVERTER DEFIBRILLATOR IMPLANT N/A 06/27/2013   Procedure: SUB Q ICD;  Surgeon: Deboraha Sprang, MD;  Location[facility] C[facility] LAB;  Service: Cardiovascular;  Laterality: N/A;  . INTRAOPERATIVE ARTERIOGRAM Right 03/31/2016   Procedure: INTRA OPERATIVE ARTERIOGRAM;  Surgeon: Angelia Mould, MD;  Location: [place];  Service: Vascular;  Laterality: Right;  . IR GENERIC HISTORICAL Right 11/30/2015   IR THROMBECTOMY AV FISTULA W/THROMBOLYSIS/PTA INC/SHUNT/IMG RIGHT 11/30/2015 Aletta Edouard, MD [facility]  . IR GENERIC HISTORICAL  11/30/2015   IR US GUIDE VASC ACCESS RIGHT 11/30/2015 Aletta Edouard, MD [facility]  . IR GENERIC HISTORICAL Right 12/15/2015   IR THROMBECTOMY AV FISTULA W/THROMBOLYSIS/PTA/STENT INC/SHUNT/IMG RT 12/15/2015 Arne Cleveland, MD [facility]  . IR GENERIC HISTORICAL  12/15/2015   IR US GUIDE VASC ACCESS RIGHT 12/15/2015 Arne Cleveland, MD [facility]  . IR GENERIC HISTORICAL  12/28/2015   IR FLUORO GUIDE CV LINE RIGHT 12/28/2015 Marybelle Killings, MD [facility]  . IR GENERIC HISTORICAL  12/28/2015   IR US GUIDE VASC ACCESS RIGHT 12/28/2015 Marybelle Killings, MD [facility]  . LEFT A ND RIGHT HEART CATH  01/30/2013   DR Sung Amabile  . LEFT AND RIGHT HEART CATHETERIZATION WITH CORONARY ANGIOGRAM N/A 01/30/2013   Procedure: LEFT AND RIGHT HEART CATHETERIZATION WITH CORONARY ANGIOGRAM;  Surgeon: Jolaine Artist, MD;  Location: [facility] CATH LAB;  Service: Cardiovascular;  Laterality: N/A;  . PERIPHERAL VASCULAR CATHETERIZATION Right 01/26/2015   Procedure: A/V Fistulagram;  Surgeon: Angelia Mould, MD;  Location: [place] [facility];  Service: Cardiovascular;  Laterality: Right;  . reapea urethral surgery for recurrent obstruction  2011  . TOTAL KNEE ARTHROPLASTY Right 2007  . VEIN HARVEST Right 03/31/2016    Procedure: RIGHT GREATER SAPHENOUS VEIN HARVEST;  Surgeon: Angelia Mould, MD;  Location: [place];  Service: Vascular;  Laterality: Right;    There were no vitals filed for this visit.    09/13/16 1319  Symptoms/Limitations  Subjective No new complaitns. No falls to report. Some discomfort on top of shin bone, otherwise no issues to report.   Patient is accompained by: Family member  Pertinent History Rt TTA, ESRD, DM, HTN, peripheral neuropathy, cardiomyopathy, CHF, COPD, AVF sg, R total knee replacement 2007  Limitations Lifting;Standing;Walking;House hold activities  Patient Stated Goals Walk without assistance or an AD in the community  Pain Assessment  Currently in Pain? Yes  Pain Score 4  Pain Location Leg  Pain Orientation Right (distal tibia)  Pain Descriptors / Indicators Sore;Tender  Pain Onset More than a month ago  Pain Frequency Intermittent  Aggravating Factors  increase pressure of prosthesis  Pain Relieving Factors rest, sitting down, socks    09/13/16 1323  Transfers  Transfers Sit to Stand;Stand to Sit  Sit to Stand 5: Supervision;With upper extremity assist;With armrests;From chair/3-in-1  Stand to Sit 5: Supervision;With upper extremity assist;With armrests;To chair/3-in-1  Ambulation/Gait  Ambulation/Gait Yes  Ambulation/Gait Assistance 5: Supervision  Ambulation/Gait Assistance Details occasional cues on cane placement with gait and posture. min guard assist on gravel/grass with no balance issues noted. Pt okayed to use cane for short community distances in addition to in house. He is to keep using RW when going to an unfamiliar enviroment at this time.   Ambulation Distance (Feet) 450 Feet (x1, 500 x1 in/outdoors)  Assistive device Straight cane (with rubber tip on )  Gait Pattern Step-through pattern;Trunk flexed;Narrow base of support;Decreased stance time - right;Decreased step length - left  Ambulation Surface Level;Indoor  Stairs Yes  Stairs  Assistance 5: Supervision;4: Min guard  Stairs Assistance Details (indicate cue type and reason) rail/cane combonation using each rail with supervision- reciprocal steps up and step to pattern to descend. switched to bil rails to work on reciprocal pattern up and down with cues on prothetic foot placement on steps with desceding. cues to advance hands on rails and for weight shifting  Stair Management Technique One rail Right;One rail Left;Two rails;Alternating pattern;Forwards;Step to pattern;With cane  Number of Stairs 4 (x 5 reps)  Ramp Other (comment);5: Supervision (min guard assist)  Ramp Details (indicate cue type and reason) x 2 reps with cane, cues on step length and weight shifting  Curb Other (comment) (min guard assist)  Curb Details (indicate cue type and reason) x 2 reps with indoor curb with cues on stance position for balance assistance and cane placement  Prosthetics  Prosthetic Care Comments  Re-education on positioning of liner sock at top, cues to avoid knee crease for skin integrety.   Current prosthetic wear tolerance (days/week)  daily  Current prosthetic wear tolerance (#hours/day)  reports most of awake hours with 30 min breaks ~3 times /day.   Residual limb condition  intact with no issues noted  Education Provided Skin check;Residual limb care;Correct ply sock adjustment;Proper Donning;Proper wear schedule/adjustment;Other (comment)  Person(s) Educated Patient;Spouse  Education Method Explanation;Demonstration;Verbal cues  Education Method Verbalized understanding;Verbal cues required;Needs further instruction  Donning Prosthesis 5  Doffing Prosthesis 5         09/13/16 1344  Balance Exercises: Standing  Standing Eyes Closed Narrow base of support (BOS);Wide (BOA);Head turns;Foam/compliant surface;Other reps (comment);30 secs;Limitations  Other Standing Exercises gait along ~ 50 foot hallway: forward gait with head turns left<>fwd<>right and then up<>fwd<>down  x 4 laps each with cane and min guard to min assist for balance.   Balance Exercises: Standing  Standing Eyes Closed Limitations on airex without UE support: narrow base of support EC no head movements, progressing to wide base of support EC head movements left<>right and up<>down with min assist for balance. cues on posture and weight shifting to assist with  balance.            PT Short Term Goals - 09/06/16 1808      PT SHORT TERM GOAL #1   Title Patient tolerates prosthesis wear >10hrs total /day with no skin issues & limb pain </= 6/10    Time 4   Period Weeks   Status On-going   Target Date 09/22/16     PT SHORT TERM GOAL #2   Title Patient verbalizes proper cleaning, weighing & wearing at dialysis.    Time 4   Period Weeks   Status On-going   Target Date 09/22/16     PT SHORT TERM GOAL #3   Title Patient ambulates 300' with RW or crutches & prosthesis with supervision.    Time  4   Period Weeks   Status On-going   Target Date 09/22/16     PT SHORT TERM GOAL #4   Title Patient negotiates ramps, curbs with RW or crutches & stairs with 1 rail with supervision.    Time 4   Period Weeks   Status On-going   Target Date 09/22/16     PT SHORT TERM GOAL #5   Title Patient picks up objects from floor without UE support safely.    Time 4   Period Weeks   Status On-going   Target Date 09/22/16           PT Long Term Goals - 09/06/16 1909      PT LONG TERM GOAL #1   Title Patient verbalizes proper prosthetic care including wear/use at dialysis to enable safe use of prosthesis.    Time 12   Period Weeks   Status On-going   Target Date 11/18/16     PT LONG TERM GOAL #2   Title Patient tolerates wear of prosthesis >90% of awake hours with no skin issues & limb pain </= 2/10   Time 12   Period Weeks   Status On-going   Target Date 11/18/16     PT LONG TERM GOAL #3   Title Patient ambulates 17' around household furniture carrying plate & cup with prosthesis  only modified independent.    Time 12   Period Weeks   Status On-going   Target Date 11/18/16     PT LONG TERM GOAL #4   Title Berg Balance >45/56 to indicate lower fall risk.    Time 12   Period Weeks   Status On-going   Target Date 11/18/16     PT LONG TERM GOAL #5   Title Patient ambulates 500' outdoors including grass with cane & prosthesis modified independent to enable community mobility.    Time 12   Period Weeks   Status On-going   Target Date 11/18/16     PT LONG TERM GOAL #6   Title Patient negotiates ramps, curbs & stairs (1 rail) with cane & prosthesis modified independent for community access.    Time 12   Period Weeks   Status On-going   Target Date 11/18/16     PT LONG TERM GOAL #7   Title Patient able to lift/ carry 15# box, push /pull items & negotiate around/over ostacles with prosthesis modified independent for household tasks.    Time 12   Period Weeks   Status On-going   Target Date 11/18/16        09/13/16 1322  Plan  Clinical Impression Statement Today's skilled session continued to address gait with prosthesis/cane and high level balance. Pt is making steady progress toward goals and should benefit from continued PT to progress toward unmet goals.  Pt will benefit from skilled therapeutic intervention in order to improve on the following deficits Abnormal gait;Decreased activity tolerance;Decreased balance;Decreased knowledge of use of DME;Decreased mobility;Postural dysfunction;Prosthetic Dependency  Rehab Potential Good  PT Frequency 2x / week  PT Duration 12 weeks  PT Treatment/Interventions ADLs/Self Care Home Management;DME Instruction;Gait training;Stair training;Functional mobility training;Therapeutic activities;Therapeutic exercise;Balance training;Neuromuscular re-education;Patient/family education;Prosthetic Training  PT Next Visit Plan continue to work on gait with cane, including barriers with cane and outdoor surfaces as weather  allows, continue to work on balance reactions as well  Consulted and Agree with Plan of Care Patient;Family member/caregiver  Family Member Consulted wife     Patient will benefit from skilled  therapeutic intervention in order to improve the following deficits and impairments:  Abnormal gait, Decreased activity tolerance, Decreased balance, Decreased knowledge of use of DME, Decreased mobility, Postural dysfunction, Prosthetic Dependency  Visit Diagnosis: Other abnormalities of gait and mobility  Unsteadiness on feet  Muscle weakness (generalized)  Repeated falls     Problem List Patient Active Problem List   Diagnosis Date Noted  . Onychomycosis 08/16/2016  . Status post unilateral below knee amputation, right ([place]) 04/26/2016  . Cutaneous abscess of right foot 04/19/2016  . Diabetic foot infection ([place])   . Diabetic foot ulcer ([place]) 03/30/2016  . Bilateral carotid bruits 03/30/2016  . Diabetes mellitus with complication ([place])   . Anemia due to chronic kidney disease   . ESRD (end stage renal disease) on dialysis ([place]) 11/30/2015  . Acute respiratory failure with hypoxia ([place]) 11/29/2015  . At high risk for falls 09/29/2014  . Peripheral vascular disease ([place]) 09/29/2014  . Osteoarthritis of both knees 01/07/2014  . Osteoarthritis of multiple joints 10/06/2013  . NICM (nonischemic cardiomyopathy) ([place]) 06/27/2013  . CAD (coronary artery disease) 03/13/2013  . Abnormal nuclear stress test 01/27/2013  . Chronic systolic congestive heart failure ([place]) 01/27/2013  . COPD (chronic obstructive pulmonary disease) ([place]) 12/27/2012  . Diabetic polyneuropathy associated with type 2 diabetes mellitus ([place]) 02/10/2012  . PYELITIS/PYELONEPHRITIS DISEASES CLASSIFIED ELSW 09/28/2009  . URINARY CALCULUS 09/28/2009  . Adjustment disorder with depressed mood 08/21/2009  . Poorly controlled type II diabetes mellitus with renal complication ([place]) 09/47/0962  . Hyperlipidemia 04/14/2008   . Essential hypertension 04/14/2008  . ALLERGIC RHINITIS 04/14/2008    Willow Ora, PTA, [facility] [address] [place], [place] [address] [phone] 09/14/16, 10:11 PM   Name: Anthony Bullock MRN: [id number] Date of Birth: 29-Dec-1960

## 2016-09-15 ENCOUNTER — Ambulatory Visit: Payer: BLUE CROSS/BLUE SHIELD | Admitting: Physical Therapy

## 2016-09-15 DIAGNOSIS — R2689 Other abnormalities of gait and mobility: Secondary | ICD-10-CM

## 2016-09-15 DIAGNOSIS — M79661 Pain in right lower leg: Secondary | ICD-10-CM

## 2016-09-15 DIAGNOSIS — R296 Repeated falls: Secondary | ICD-10-CM

## 2016-09-15 DIAGNOSIS — R2681 Unsteadiness on feet: Secondary | ICD-10-CM

## 2016-09-15 DIAGNOSIS — M6281 Muscle weakness (generalized): Secondary | ICD-10-CM

## 2016-09-15 NOTE — Therapy (Signed)
[facility] [address] [place], [place], [address] Phone: [phone]   Fax:  [phone]  Physical Therapy Treatment  Patient Details  Name: Anthony Bullock MRN: [id number] Date of Birth: 1960/05/27 Referring Provider: Meridee Score, MD  Encounter Date: 09/15/2016      PT End of Session - 09/15/16 1539    Visit Number 6   Number of Visits 25   Date for PT Re-Evaluation 11/18/16   Authorization Type [employer]   Authorization Time Period $650 deductible met, 20% coinsurance, 60 visit comb limit, 0 authorization,    PT Start Time 1446   PT Stop Time 1525   PT Time Calculation (min) 39 min   Equipment Utilized During Treatment Gait belt   Activity Tolerance Patient tolerated treatment well   Behavior During Therapy WFL for tasks assessed/performed      Past Medical History:  Diagnosis Date  . 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 Allergic rhinitis   . Anemia   . Arthritis   . Chronic systolic heart failure ([place])    a. ECHO (12/2012) EF 25-30%, HK entireanteroseptal myocardium //  b.  EF 25%, diffuse HK, grade 1 diastolic dysfunction, MAC, mild LAE, normal RVSF, trivial pericardial effusion  . COPD (chronic obstructive pulmonary disease) ([place])   . Diabetes mellitus type II   . Diabetic nephropathy ([place])   . Diabetic neuropathy ([place])   . ESRD on hemodialysis [facility])    started HD June 2017, goes to [facility] HD unit, Dr Hinda Lenis  . History of cardiac catheterization    a.Myoview 1/15:  There is significant left ventricular dysfunction. There may be slight scar at the apex. There is no significant ischemia. LV Ejection Fraction: 27%  //  b. RHC/LHC (1/15) with mean RA 6, PA 47/22 mean 33, mean PCWP 20, PVR 2.5 WU, CI 2.5; 80% dLAD stenosis, 70% diffuse large D.   . History of kidney stones   . Hyperlipidemia   . Hypertension   . Kidney stones   . NICM (nonischemic cardiomyopathy)  ([place])    Primarily nonischemic. Echo (12/14) with EF 25-30%. Echo (3/15) with EF 25%, mild to moderately dilated LV, normal RV size and systolic function.   . Pneumonia   . Urethral stricture     Past Surgical History:  Procedure Laterality Date  . ABDOMINAL AORTOGRAM W/LOWER EXTREMITY N/A 03/30/2016   Procedure: Abdominal Aortogram w/Lower Extremity;  Surgeon: Angelia Mould, MD;  Location: [place] CV LAB;  Service: Cardiovascular;  Laterality: N/A;  . AMPUTATION Right 04/26/2016   Procedure: Right Below Knee Amputation;  Surgeon: Newt Minion, MD;  Location: [facility];  Service: Orthopedics;  Laterality: Right;  . AV FISTULA PLACEMENT Right 09/08/2015   Procedure: INSERTION OF 4-38m x 45cm  ARTERIOVENOUS (AV) GORE-TEX GRAFT RIGHT UPPER  ARM;  Surgeon: CAngelia Mould MD;  [place] [facility]  Service: Vascular;  Laterality: Right;  . AV FISTULA PLACEMENT Left 01/14/2016   Procedure: CREATION OF LEFT UPPER ARM ARTERIOVENOUS FISTULA;  Surgeon: CAngelia Mould MD;  [place] [facility]  Service: Vascular;  Laterality: Left;  . BASCILIC VEIN TRANSPOSITION Right 08/22/2014   Procedure: RIGHT UPPER ARM BASCILIC VEIN TRANSPOSITION;  Surgeon: CAngelia Mould MD;  [place]  Service: Vascular;  Laterality: Right;  . BELOW KNEE LEG AMPUTATION Right 04/26/2016  . CARDIAC CATHETERIZATION    . CARDIAC DEFIBRILLATOR PLACEMENT  06/27/2013   Sub Q  BY DR Caryl Comes  . COLONOSCOPY WITH PROPOFOL N/A 07/22/2015   Procedure: COLONOSCOPY WITH PROPOFOL;  Surgeon: Doran Stabler, MD;  Location: [facility];  Service: Gastroenterology;  Laterality: N/A;  . FEMORAL-POPLITEAL BYPASS GRAFT Right 03/31/2016   Procedure: BYPASS GRAFT FEMORAL-POPLITEAL ARTERY USING RIGHT GREATER SAPHENOUS NONREVERSED VEIN;  Surgeon: Angelia Mould, MD;  Location: [facility];  Service: Vascular;  Laterality: Right;  . HERNIA REPAIR    . I&D EXTREMITY Right 03/31/2016   Procedure: IRRIGATION AND DEBRIDEMENT  FOOT;  Surgeon: Angelia Mould, MD;  Location: [facility];  Service: Vascular;  Laterality: Right;  . IMPLANTABLE CARDIOVERTER DEFIBRILLATOR IMPLANT N/A 06/27/2013   Procedure: SUB Q ICD;  Surgeon: Deboraha Sprang, MD;  Location: [facility] CATH LAB;  Service: Cardiovascular;  Laterality: N/A;  . INTRAOPERATIVE ARTERIOGRAM Right 03/31/2016   Procedure: INTRA OPERATIVE ARTERIOGRAM;  Surgeon: Angelia Mould, MD;  Location: [facility];  Service: Vascular;  Laterality: Right;  . IR GENERIC HISTORICAL Right 11/30/2015   IR THROMBECTOMY AV FISTULA W/THROMBOLYSIS/PTA INC/SHUNT/IMG RIGHT 11/30/2015 Aletta Edouard, MD [facility]  . IR GENERIC HISTORICAL  11/30/2015   IR US GUIDE VASC ACCESS RIGHT 11/30/2015 Aletta Edouard, MD [facility]  . IR GENERIC HISTORICAL Right 12/15/2015   IR THROMBECTOMY AV FISTULA W/THROMBOLYSIS/PTA/STENT INC/SHUNT/IMG RT 12/15/2015 Arne Cleveland, MD [facility]  . IR GENERIC HISTORICAL  12/15/2015   IR US GUIDE VASC ACCESS RIGHT 12/15/2015 Arne Cleveland, MD [facility]  . IR GENERIC HISTORICAL  12/28/2015   IR FLUORO GUIDE CV LINE RIGHT 12/28/2015 Marybelle Killings, MD [facility]  . IR GENERIC HISTORICAL  12/28/2015   IR US GUIDE VASC ACCESS RIGHT 12/28/2015 Marybelle Killings, MD [facility]  . LEFT A ND RIGHT HEART CATH  01/30/2013   DR Sung Amabile  . LEFT AND RIGHT HEART CATHETERIZATION WITH CORONARY ANGIOGRAM N/A 01/30/2013   Procedure: LEFT AND RIGHT HEART CATHETERIZATION WITH CORONARY ANGIOGRAM;  Surgeon: Jolaine Artist, MD;  Location: [facility] C[facility] LAB;  Service: Cardiovascular;  Laterality: N/A;  . PERIPHERAL VASCULAR CATHETERIZATION Right 01/26/2015   Procedure: A/V Fistulagram;  Surgeon: Angelia Mould, MD;  Location: [place] CV LAB;  Service: Cardiovascular;  Laterality: Right;  . reapea urethral surgery for recurrent obstruction  2011  . TOTAL KNEE ARTHROPLASTY Right 2007  . VEIN HARVEST Right 03/31/2016   Procedure: RIGHT GREATER SAPHENOUS VEIN HARVEST;  Surgeon:  Angelia Mould, MD;  Location: [facility];  Service: Vascular;  Laterality: Right;    There were no vitals filed for this visit.      Subjective Assessment - 09/15/16 1450    Subjective no new report.  c/o some soreness which is baseline.     Patient Stated Goals Walk without assistance or an AD in the community   Currently in Pain? Yes   Pain Score 4    Pain Location Leg   Pain Orientation Right  distal tibia   Pain Descriptors / Indicators Sore;Tender   Pain Onset More than a month ago   Pain Frequency Intermittent   Aggravating Factors  increased pressure of prosthesis   Pain Relieving Factors rest, sitting down, socks                         [facility] Adult PT Treatment/Exercise - 09/15/16 1451      Ambulation/Gait   Ambulation/Gait Yes   Ambulation/Gait Assistance 5: Supervision   Ambulation Distance (Feet) 500 Feet   Assistive device Straight cane  with rubber tip on    Gait Pattern Step-through pattern;Trunk flexed;Narrow base of support;Decreased stance time - right;Decreased step length - left   Ambulation Surface Level;Unlevel;Indoor;Outdoor;Grass;Paved   Stairs Yes   Stairs Assistance 5: Supervision;4: Min guard   Stairs Assistance Details (indicate cue type and reason) attempted reciprocal descending with rail/SPC but pt unable needing to continue with step to pattern   Stair Management Technique One rail Right;One rail Left;Two rails;Alternating pattern;Step to pattern;Forwards   Number of Stairs 4  x 5 reps   Height of Stairs 6   Gait Comments amb 350' indoors with supervision and cognitive tasks with [facility]             Balance Exercises - 09/15/16 1512      Balance Exercises: Standing   Stepping Strategy Anterior;Lateral;10 reps  x10 each direction bil; without UE support   Other Standing Exercises alternating taps to 4" step with intermittent UE supports; 8" step with 1 UE support and min A             PT Short Term Goals -  09/06/16 1808      PT SHORT TERM GOAL #1   Title Patient tolerates prosthesis wear >10hrs total /day with no skin issues & limb pain </= 6/10    Time 4   Period Weeks   Status On-going   Target Date 09/22/16     PT SHORT TERM GOAL #2   Title Patient verbalizes proper cleaning, weighing & wearing at dialysis.    Time 4   Period Weeks   Status On-going   Target Date 09/22/16     PT SHORT TERM GOAL #3   Title Patient ambulates 300' with RW or crutches & prosthesis with supervision.    Time 4   Period Weeks   Status On-going   Target Date 09/22/16     PT SHORT TERM GOAL #4   Title Patient negotiates ramps, curbs with RW or crutches & stairs with 1 rail with supervision.    Time 4   Period Weeks   Status On-going   Target Date 09/22/16     PT SHORT TERM GOAL #5   Title Patient picks up objects from floor without UE support safely.    Time 4   Period Weeks   Status On-going   Target Date 09/22/16           PT Long Term Goals - 09/06/16 1909      PT LONG TERM GOAL #1   Title Patient verbalizes proper prosthetic care including wear/use at dialysis to enable safe use of prosthesis.    Time 12   Period Weeks   Status On-going   Target Date 11/18/16     PT LONG TERM GOAL #2   Title Patient tolerates wear of prosthesis >90% of awake hours with no skin issues & limb pain </= 2/10   Time 12   Period Weeks   Status On-going   Target Date 11/18/16     PT LONG TERM GOAL #3   Title Patient ambulates 6' around household furniture carrying plate & cup with prosthesis only modified independent.    Time 12   Period Weeks   Status On-going   Target Date 11/18/16     PT LONG TERM GOAL #4   Title Berg Balance >45/56 to indicate lower fall risk.    Time 12   Period Weeks   Status On-going   Target Date 11/18/16  PT LONG TERM GOAL #5   Title Patient ambulates 500' outdoors including grass with cane & prosthesis modified independent to enable community mobility.     Time 12   Period Weeks   Status On-going   Target Date 11/18/16     PT LONG TERM GOAL #6   Title Patient negotiates ramps, curbs & stairs (1 rail) with cane & prosthesis modified independent for community access.    Time 12   Period Weeks   Status On-going   Target Date 11/18/16     PT LONG TERM GOAL #7   Title Patient able to lift/ carry 15# box, push /pull items & negotiate around/over ostacles with prosthesis modified independent for household tasks.    Time 12   Period Weeks   Status On-going   Target Date 11/18/16               Plan - 09/15/16 1547    Clinical Impression Statement Pt tolerated session well today, needing occasional rest breaks due to fatigue.  Continues to have difficulty with stairs but progressing well.  Will continue to benefit from PT to maximize function.   PT Treatment/Interventions ADLs/Self Care Home Management;DME Instruction;Gait training;Stair training;Functional mobility training;Therapeutic activities;Therapeutic exercise;Balance training;Neuromuscular re-education;Patient/family education;Prosthetic Training   PT Next Visit Plan continue to work on gait with cane, including barriers with cane and outdoor surfaces as weather allows, continue to work on balance reactions as well   Consulted and Agree with Plan of Care Patient      Patient will benefit from skilled therapeutic intervention in order to improve the following deficits and impairments:     Visit Diagnosis: Other abnormalities of gait and mobility  Unsteadiness on feet  Muscle weakness (generalized)  Repeated falls  Pain in right lower leg     Problem List Patient Active Problem List   Diagnosis Date Noted  . Onychomycosis 08/16/2016  . Status post unilateral below knee amputation, right ([place]) 04/26/2016  . Cutaneous abscess of right foot 04/19/2016  . Diabetic foot infection ([place])   . Diabetic foot ulcer ([place]) 03/30/2016  . Bilateral carotid bruits 03/30/2016   . Diabetes mellitus with complication ([place])   . Anemia due to chronic kidney disease   . ESRD (end stage renal disease) on dialysis ([place]) 11/30/2015  . Acute respiratory failure with hypoxia ([place]) 11/29/2015  . At high risk for falls 09/29/2014  . Peripheral vascular disease ([place]) 09/29/2014  . Osteoarthritis of both knees 01/07/2014  . Osteoarthritis of multiple joints 10/06/2013  . NICM (nonischemic cardiomyopathy) ([place]) 06/27/2013  . CAD (coronary artery disease) 03/13/2013  . Abnormal nuclear stress test 01/27/2013  . Chronic systolic congestive heart failure ([place]) 01/27/2013  . COPD (chronic obstructive pulmonary disease) ([place]) 12/27/2012  . Diabetic polyneuropathy associated with type 2 diabetes mellitus ([place]) 02/10/2012  . PYELITIS/PYELONEPHRITIS DISEASES CLASSIFIED ELSW 09/28/2009  . URINARY CALCULUS 09/28/2009  . Adjustment disorder with depressed mood 08/21/2009  . Poorly controlled type II diabetes mellitus with renal complication ([place]) 82/50/5397  . Hyperlipidemia 04/14/2008  . Essential hypertension 04/14/2008  . ALLERGIC RHINITIS 04/14/2008      Laureen Abrahams, PT, DPT 09/15/16 3:49 PM    [facility] [address] [place], [place], [address] Phone: [phone]   Fax:  [phone]  Name: Anthony Bullock MRN: [id number] Date of Birth: 1960/09/16

## 2016-09-16 ENCOUNTER — Telehealth: Payer: Self-pay | Admitting: *Deleted

## 2016-09-16 NOTE — Telephone Encounter (Signed)
Pt aware of both results denies any SOB at this time - routed to [facility] and pcp     Echo looks good, please notify patient and see if we need to send result anywhere for his transplant team   J BrancH MD

## 2016-09-16 NOTE — Telephone Encounter (Signed)
-----   Message from Arnoldo Lenis, MD sent at 09/15/2016  3:34 PM EDT ----- PFTs do show some mild abnormalites probably representing COPD. If he is not having significant SOB we do not have to treat. Please see if we need to send this to his transplant team when patient is contacted  Zandra Abts MD

## 2016-09-20 ENCOUNTER — Encounter: Payer: Self-pay | Admitting: Physical Therapy

## 2016-09-20 ENCOUNTER — Ambulatory Visit: Payer: BLUE CROSS/BLUE SHIELD | Admitting: Physical Therapy

## 2016-09-20 DIAGNOSIS — R2681 Unsteadiness on feet: Secondary | ICD-10-CM

## 2016-09-20 DIAGNOSIS — R2689 Other abnormalities of gait and mobility: Secondary | ICD-10-CM

## 2016-09-20 DIAGNOSIS — M6281 Muscle weakness (generalized): Secondary | ICD-10-CM

## 2016-09-21 NOTE — Therapy (Signed)
[facility] [address] [place], [place], [address] Phone: [phone]   Fax:  [phone]  Physical Therapy Treatment  Patient Details  Name: Anthony Bullock MRN: [id number] Date of Birth: 1960-09-23 Referring Provider: Meridee Score, MD  Encounter Date: 09/20/2016      PT End of Session - 09/20/16 1538    Visit Number 7   Number of Visits 25   Date for PT Re-Evaluation 11/18/16   Authorization Type BCBS   Authorization Time Period $650 deductible met, 20% coinsurance, 60 visit comb limit, 0 authorization,    PT Start Time 1533   PT Stop Time 1615   PT Time Calculation (min) 42 min   Equipment Utilized During Treatment Gait belt   Activity Tolerance Patient tolerated treatment well   Behavior During Therapy WFL for tasks assessed/performed      Past Medical History:  Diagnosis Date  . 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 Allergic rhinitis   . Anemia   . Arthritis   . Chronic systolic heart failure ([place])    a. ECHO (12/2012) EF 25-30%, HK entireanteroseptal myocardium //  b.  EF 25%, diffuse HK, grade 1 diastolic dysfunction, MAC, mild LAE, normal RVSF, trivial pericardial effusion  . COPD (chronic obstructive pulmonary disease) ([place])   . Diabetes mellitus type II   . Diabetic nephropathy ([place])   . Diabetic neuropathy ([place])   . ESRD on hemodialysis [facility])    started HD June 2017, goes to [facility] HD unit, Dr Hinda Lenis  . History of cardiac catheterization    a.Myoview 1/15:  There is significant left ventricular dysfunction. There may be slight scar at the apex. There is no significant ischemia. LV Ejection Fraction: 27%  //  b. RHC/LHC (1/15) with mean RA 6, PA 47/22 mean 33, mean PCWP 20, PVR 2.5 WU, CI 2.5; 80% dLAD stenosis, 70% diffuse large D.   . History of kidney stones   . Hyperlipidemia   . Hypertension   . Kidney stones   . NICM (nonischemic cardiomyopathy)  ([place])    Primarily nonischemic. Echo (12/14) with EF 25-30%. Echo (3/15) with EF 25%, mild to moderately dilated LV, normal RV size and systolic function.   . Pneumonia   . Urethral stricture     Past Surgical History:  Procedure Laterality Date  . ABDOMINAL AORTOGRAM W/LOWER EXTREMITY N/A 03/30/2016   Procedure: Abdominal Aortogram w/Lower Extremity;  Surgeon: Angelia Mould, MD;  Location: [place] CV LAB;  Service: Cardiovascular;  Laterality: N/A;  . AMPUTATION Right 04/26/2016   Procedure: Right Below Knee Amputation;  Surgeon: Newt Minion, MD;  Location: [facility];  Service: Orthopedics;  Laterality: Right;  . AV FISTULA PLACEMENT Right 09/08/2015   Procedure: INSERTION OF 4-7m x 45cm  ARTERIOVENOUS (AV) GORE-TEX GRAFT RIGHT UPPER  ARM;  Surgeon: CAngelia Mould MD;  [place]  Service: Vascular;  Laterality: Right;  . AV FISTULA PLACEMENT Left 01/14/2016   Procedure: CREATION OF LEFT UPPER ARM ARTERIOVENOUS FISTULA;  Surgeon: CAngelia Mould MD;  [place] [facility]  Service: Vascular;  Laterality: Left;  . BASCILIC VEIN TRANSPOSITION Right 08/22/2014   Procedure: RIGHT UPPER ARM BASCILIC VEIN TRANSPOSITION;  Surgeon: CAngelia Mould MD;  [place] [facility]  Service: Vascular;  Laterality: Right;  . BELOW KNEE LEG AMPUTATION Right 04/26/2016  . CARDIAC CATHETERIZATION    . CARDIAC DEFIBRILLATOR PLACEMENT  06/27/2013   Sub Q  BY DR Caryl Comes  . COLONOSCOPY WITH PROPOFOL N/A 07/22/2015   Procedure: COLONOSCOPY WITH PROPOFOL;  Surgeon: Doran Stabler, MD;  Location: [facility];  Service: Gastroenterology;  Laterality: N/A;  . FEMORAL-POPLITEAL BYPASS GRAFT Right 03/31/2016   Procedure: BYPASS GRAFT FEMORAL-POPLITEAL ARTERY USING RIGHT GREATER SAPHENOUS NONREVERSED VEIN;  Surgeon: Angelia Mould, MD;  Location: [place];  Service: Vascular;  Laterality: Right;  . HERNIA REPAIR    . I&D EXTREMITY Right 03/31/2016   Procedure: IRRIGATION AND DEBRIDEMENT  FOOT;  Surgeon: Angelia Mould, MD;  Location: [place];  Service: Vascular;  Laterality: Right;  . IMPLANTABLE CARDIOVERTER DEFIBRILLATOR IMPLANT N/A 06/27/2013   Procedure: SUB Q ICD;  Surgeon: Deboraha Sprang, MD;  Location: [facility] CATH LAB;  Service: Cardiovascular;  Laterality: N/A;  . INTRAOPERATIVE ARTERIOGRAM Right 03/31/2016   Procedure: INTRA OPERATIVE ARTERIOGRAM;  Surgeon: Angelia Mould, MD;  Location: [place];  Service: Vascular;  Laterality: Right;  . IR GENERIC HISTORICAL Right 11/30/2015   IR THROMBECTOMY AV FISTULA W/THROMBOLYSIS/PTA INC/SHUNT/IMG RIGHT 11/30/2015 Aletta Edouard, MD [facility]  . IR GENERIC HISTORICAL  11/30/2015   IR US GUIDE VASC ACCESS RIGHT 11/30/2015 Aletta Edouard, MD [facility]  . IR GENERIC HISTORICAL Right 12/15/2015   IR THROMBECTOMY AV FISTULA W/THROMBOLYSIS/PTA/STENT INC/SHUNT/IMG RT 12/15/2015 Arne Cleveland, MD [facility]  . IR GENERIC HISTORICAL  12/15/2015   IR US GUIDE VASC ACCESS RIGHT 12/15/2015 Arne Cleveland, MD [facility]  . IR GENERIC HISTORICAL  12/28/2015   IR FLUORO GUIDE CV LINE RIGHT 12/28/2015 Marybelle Killings, MD [facility]  . IR GENERIC HISTORICAL  12/28/2015   IR US GUIDE VASC ACCESS RIGHT 12/28/2015 Marybelle Killings, MD [facility]  . LEFT A ND RIGHT HEART CATH  01/30/2013   DR Sung Amabile  . LEFT AND RIGHT HEART CATHETERIZATION WITH CORONARY ANGIOGRAM N/A 01/30/2013   Procedure: LEFT AND RIGHT HEART CATHETERIZATION WITH CORONARY ANGIOGRAM;  Surgeon: Jolaine Artist, MD;  Location: [facility] CATH LAB;  Service: Cardiovascular;  Laterality: N/A;  . PERIPHERAL VASCULAR CATHETERIZATION Right 01/26/2015   Procedure: A/V Fistulagram;  Surgeon: Angelia Mould, MD;  Location: [place] [facility];  Service: Cardiovascular;  Laterality: Right;  . reapea urethral surgery for recurrent obstruction  2011  . TOTAL KNEE ARTHROPLASTY Right 2007  . VEIN HARVEST Right 03/31/2016   Procedure: RIGHT GREATER SAPHENOUS VEIN HARVEST;  Surgeon:  Angelia Mould, MD;  Location: [place];  Service: Vascular;  Laterality: Right;    There were no vitals filed for this visit.      Subjective Assessment - 09/20/16 1536    Subjective No new complaints. Was tired and sore after last session. Still having pain at the shin area of residual limb.    Pertinent History Rt TTA, ESRD, DM, HTN, peripheral neuropathy, cardiomyopathy, CHF, COPD, AVF sg, R total knee replacement 2007   Limitations Lifting;Standing;Walking;House hold activities   Patient Stated Goals Walk without assistance or an AD in the community   Currently in Pain? Yes   Pain Score 4    Pain Location Leg   Pain Orientation Right  distal tibia   Pain Descriptors / Indicators Sore;Tender   Pain Type Acute pain   Pain Onset More than a month ago   Pain Frequency Intermittent   Aggravating Factors  increased pressure of prosthesis   Pain Relieving Factors rest, sitting down, sock ply adjustment             [facility] Adult PT Treatment/Exercise -  09/20/16 1538      Transfers   Transfers Sit to Stand;Stand to Sit   Sit to Stand 5: Supervision   Stand to Sit 5: Supervision     Ambulation/Gait   Ambulation/Gait Yes   Ambulation/Gait Assistance 5: Supervision   Ambulation/Gait Assistance Details occasional cues on cane position with gait. minor veering noted with enviromental scanning.                     Ambulation Distance (Feet) 500 Feet   Assistive device Straight cane  rubber quad tip   Gait Pattern Step-through pattern;Decreased stance time - right;Decreased step length - left;Narrow base of support;Antalgic   Ambulation Surface Level;Indoor   Stairs Yes   Stairs Assistance 5: Supervision   Stair Management Technique One rail Right;Alternating pattern;Step to pattern;Forwards;With cane   Number of Stairs 4  x2 reps   Height of Stairs 6   Ramp 5: Supervision   Ramp Details (indicate cue type and reason) x1 with cane/prosthesis, supervision for safety, no  cues needed   Curb 5: Supervision   Curb Details (indicate cue type and reason) x1 on indoor 6 inch curb with cane/prosthesis.    Gait Comments along ~50 foot hallway: forward gait with cane with head movements right<>fwd<>left x 2 laps, then up<>fwd<>down x 2 laps with min guard to min assist for balance     High Level Balance   High Level Balance Activities Negotiating over obstacles   High Level Balance Comments with cane: forward stepping over 4 bolsters of short height x 6 laps with min guard to min assist for balance. mod cues needed on correct sequencing.      Prosthetics   Current prosthetic wear tolerance (days/week)  daily   Current prosthetic wear tolerance (#hours/day)  all awake hours drying as needed   Residual limb condition  intact with no issues noted   Donning Prosthesis Supervision   Doffing Prosthesis Supervision             PT Short Term Goals - 09/20/16 1539      PT SHORT TERM GOAL #1   Title Patient tolerates prosthesis wear >10hrs total /day with no skin issues & limb pain </= 6/10    Baseline 09/20/16: met today   Status Achieved     PT SHORT TERM GOAL #2   Title Patient verbalizes proper cleaning, weighing & wearing at dialysis.    Baseline 09/20/16: met today   Status Achieved     PT SHORT TERM GOAL #3   Title Patient ambulates 300' with RW or crutches & prosthesis with supervision.    Baseline 09/20/16: 500 feet with cane/prosthesis on indoor/outdoor surfaces with supervision   Time --   Period --   Status Achieved     PT SHORT TERM GOAL #4   Title Patient negotiates ramps, curbs with RW or crutches & stairs with 1 rail with supervision.    Baseline 09/20/16: met today with cane/prosthesis for ramp/curb and rail/cane for stairs   Time --   Period --   Status Achieved     PT SHORT TERM GOAL #5   Title Patient picks up objects from floor without UE support safely.    Baseline 09/20/16: met today    Time --   Period --   Status Achieved            PT Long Term Goals - 09/06/16 1909      PT LONG TERM GOAL #1  Title Patient verbalizes proper prosthetic care including wear/use at dialysis to enable safe use of prosthesis.    Time 12   Period Weeks   Status On-going   Target Date 11/18/16     PT LONG TERM GOAL #2   Title Patient tolerates wear of prosthesis >90% of awake hours with no skin issues & limb pain </= 2/10   Time 12   Period Weeks   Status On-going   Target Date 11/18/16     PT LONG TERM GOAL #3   Title Patient ambulates 22' around household furniture carrying plate & cup with prosthesis only modified independent.    Time 12   Period Weeks   Status On-going   Target Date 11/18/16     PT LONG TERM GOAL #4   Title Berg Balance >45/56 to indicate lower fall risk.    Time 12   Period Weeks   Status On-going   Target Date 11/18/16     PT LONG TERM GOAL #5   Title Patient ambulates 500' outdoors including grass with cane & prosthesis modified independent to enable community mobility.    Time 12   Period Weeks   Status On-going   Target Date 11/18/16     PT LONG TERM GOAL #6   Title Patient negotiates ramps, curbs & stairs (1 rail) with cane & prosthesis modified independent for community access.    Time 12   Period Weeks   Status On-going   Target Date 11/18/16     PT LONG TERM GOAL #7   Title Patient able to lift/ carry 15# box, push /pull items & negotiate around/over ostacles with prosthesis modified independent for household tasks.    Time 12   Period Weeks   Status On-going   Target Date 11/18/16        09/20/16 1538  Plan  Clinical Impression Statement Today's skilled session addressed progress toward goals with all STGs met today. Remainder of session continued to address balance with prosthesis. Pt is progressing toward LTGs and should benefit from continued PT to progress toward unmet goals.   PT Treatment/Interventions ADLs/Self Care Home Management;DME Instruction;Gait  training;Stair training;Functional mobility training;Therapeutic activities;Therapeutic exercise;Balance training;Neuromuscular re-education;Patient/family education;Prosthetic Training  PT Next Visit Plan continue to work on gait with cane, including barriers with cane and outdoor surfaces as weather allows, continue to work on balance reactions as well  Consulted and Agree with Plan of Care Patient       Patient will benefit from skilled therapeutic intervention in order to improve the following deficits and impairments:     Visit Diagnosis: Other abnormalities of gait and mobility  Unsteadiness on feet  Muscle weakness (generalized)     Problem List Patient Active Problem List   Diagnosis Date Noted  . Onychomycosis 08/16/2016  . Status post unilateral below knee amputation, right ([place]) 04/26/2016  . Cutaneous abscess of right foot 04/19/2016  . Diabetic foot infection ([place])   . Diabetic foot ulcer ([place]) 03/30/2016  . Bilateral carotid bruits 03/30/2016  . Diabetes mellitus with complication ([place])   . Anemia due to chronic kidney disease   . ESRD (end stage renal disease) on dialysis ([place]) 11/30/2015  . Acute respiratory failure with hypoxia ([place]) 11/29/2015  . At high risk for falls 09/29/2014  . Peripheral vascular disease ([place]) 09/29/2014  . Osteoarthritis of both knees 01/07/2014  . Osteoarthritis of multiple joints 10/06/2013  . NICM (nonischemic cardiomyopathy) ([place]) 06/27/2013  . CAD (coronary artery disease) 03/13/2013  .  Abnormal nuclear stress test 01/27/2013  . Chronic systolic congestive heart failure ([place]) 01/27/2013  . COPD (chronic obstructive pulmonary disease) ([place]) 12/27/2012  . Diabetic polyneuropathy associated with type 2 diabetes mellitus ([place]) 02/10/2012  . PYELITIS/PYELONEPHRITIS DISEASES CLASSIFIED ELSW 09/28/2009  . URINARY CALCULUS 09/28/2009  . Adjustment disorder with depressed mood 08/21/2009  . Poorly controlled type II diabetes mellitus  with renal complication ([place]) 75/73/2256  . Hyperlipidemia 04/14/2008  . Essential hypertension 04/14/2008  . ALLERGIC RHINITIS 04/14/2008    Willow Ora, PTA, [facility] [address] [place], [place] [address] [phone] 09/22/16, 8:20 AM   Name: Anthony Bullock MRN: [id number] Date of Birth: Jun 06, 1960

## 2016-09-22 ENCOUNTER — Encounter: Payer: Self-pay | Admitting: Physical Therapy

## 2016-09-22 ENCOUNTER — Ambulatory Visit: Payer: BLUE CROSS/BLUE SHIELD | Admitting: Physical Therapy

## 2016-09-22 DIAGNOSIS — M6281 Muscle weakness (generalized): Secondary | ICD-10-CM

## 2016-09-22 DIAGNOSIS — R2689 Other abnormalities of gait and mobility: Secondary | ICD-10-CM | POA: Diagnosis not present

## 2016-09-22 DIAGNOSIS — R2681 Unsteadiness on feet: Secondary | ICD-10-CM

## 2016-09-24 NOTE — Therapy (Signed)
[facility] [address] [place], [place], [address] Phone: [phone]   Fax:  [phone]  Physical Therapy Treatment  Patient Details  Name: Anthony Bullock MRN: [id number] Date of Birth: 1960-08-01 Referring Provider: Meridee Score, MD  Encounter Date: 09/22/2016   09/22/16 1457  PT Visits / Re-Eval  Visit Number 8  Number of Visits 25  Date for PT Re-Evaluation 11/18/16  Authorization  Authorization Type [employer]  Authorization Time Period $650 deductible met, 20% coinsurance, 60 visit comb limit, 0 authorization,   PT Time Calculation  PT Start Time 1445  PT Stop Time 1528  PT Time Calculation (min) 43 min  PT - End of Session  Equipment Utilized During Treatment Gait belt  Activity Tolerance Patient tolerated treatment well  Behavior During Therapy [facility] for tasks assessed/performed     Past Medical History:  Diagnosis Date  . 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 Allergic rhinitis   . Anemia   . Arthritis   . Chronic systolic heart failure ([place])    a. ECHO (12/2012) EF 25-30%, HK entireanteroseptal myocardium //  b.  EF 25%, diffuse HK, grade 1 diastolic dysfunction, MAC, mild LAE, Anthony RVSF, trivial pericardial effusion  . COPD (chronic obstructive pulmonary disease) ([place])   . Diabetes mellitus type II   . Diabetic nephropathy ([place])   . Diabetic neuropathy ([place])   . ESRD on hemodialysis [facility])    started HD June 2017, goes to [facility], Dr Hinda Lenis  . History of cardiac catheterization    a.Myoview 1/15:  There is significant left ventricular dysfunction. There may be slight scar at the apex. There is no significant ischemia. LV Ejection Fraction: 27%  //  b. RHC/LHC (1/15) with mean RA 6, PA 47/22 mean 33, mean PCWP 20, PVR 2.5 WU, CI 2.5; 80% dLAD stenosis, 70% diffuse large D.   . History of kidney stones   . Hyperlipidemia   . Hypertension   . Kidney  stones   . NICM (nonischemic cardiomyopathy) ([place])    Primarily nonischemic. Echo (12/14) with EF 25-30%. Echo (3/15) with EF 25%, mild to moderately dilated LV, Anthony RV size and systolic function.   . Pneumonia   . Urethral stricture     Past Surgical History:  Procedure Laterality Date  . ABDOMINAL AORTOGRAM W/LOWER EXTREMITY N/A 03/30/2016   Procedure: Abdominal Aortogram w/Lower Extremity;  Surgeon: Angelia Mould, MD;  Location: [facility] CV LAB;  Service: Cardiovascular;  Laterality: N/A;  . AMPUTATION Right 04/26/2016   Procedure: Right Below Knee Amputation;  Surgeon: Newt Minion, MD;  Location: [place];  Service: Orthopedics;  Laterality: Right;  . AV FISTULA PLACEMENT Right 09/08/2015   Procedure: INSERTION OF 4-83m x 45cm  ARTERIOVENOUS (AV) GORE-TEX GRAFT RIGHT UPPER  ARM;  Surgeon: CAngelia Mould MD;  [place]  Service: Vascular;  Laterality: Right;  . AV FISTULA PLACEMENT Left 01/14/2016   Procedure: CREATION OF LEFT UPPER ARM ARTERIOVENOUS FISTULA;  Surgeon: CAngelia Mould MD;  [place] [facility]  Service: Vascular;  Laterality: Left;  . BASCILIC VEIN TRANSPOSITION Right 08/22/2014   Procedure: RIGHT UPPER ARM BASCILIC VEIN TRANSPOSITION;  Surgeon: CAngelia Mould MD;  [place]  Service: Vascular;  Laterality: Right;  . BELOW KNEE LEG AMPUTATION Right 04/26/2016  . CARDIAC CATHETERIZATION    . CARDIAC DEFIBRILLATOR PLACEMENT  06/27/2013   Sub Q       BY  DR Caryl Comes  . COLONOSCOPY WITH PROPOFOL N/A 07/22/2015   Procedure: COLONOSCOPY WITH PROPOFOL;  Surgeon: Doran Stabler, MD;  Location: [facility];  Service: Gastroenterology;  Laterality: N/A;  . FEMORAL-POPLITEAL BYPASS GRAFT Right 03/31/2016   Procedure: BYPASS GRAFT FEMORAL-POPLITEAL ARTERY USING RIGHT GREATER SAPHENOUS NONREVERSED VEIN;  Surgeon: Angelia Mould, MD;  Location: [facility];  Service: Vascular;  Laterality: Right;  . HERNIA REPAIR    . I&D EXTREMITY Right  03/31/2016   Procedure: IRRIGATION AND DEBRIDEMENT FOOT;  Surgeon: Angelia Mould, MD;  Location: [facility];  Service: Vascular;  Laterality: Right;  . IMPLANTABLE CARDIOVERTER DEFIBRILLATOR IMPLANT N/A 06/27/2013   Procedure: SUB Q ICD;  Surgeon: Deboraha Sprang, MD;  Location: [facility] CATH LAB;  Service: Cardiovascular;  Laterality: N/A;  . INTRAOPERATIVE ARTERIOGRAM Right 03/31/2016   Procedure: INTRA OPERATIVE ARTERIOGRAM;  Surgeon: Angelia Mould, MD;  Location: [facility];  Service: Vascular;  Laterality: Right;  . IR GENERIC HISTORICAL Right 11/30/2015   IR THROMBECTOMY AV FISTULA W/THROMBOLYSIS/PTA INC/SHUNT/IMG RIGHT 11/30/2015 Aletta Edouard, MD [facility]  . IR GENERIC HISTORICAL  11/30/2015   IR US GUIDE VASC ACCESS RIGHT 11/30/2015 Aletta Edouard, MD [facility]  . IR GENERIC HISTORICAL Right 12/15/2015   IR THROMBECTOMY AV FISTULA W/THROMBOLYSIS/PTA/STENT INC/SHUNT/IMG RT 12/15/2015 Arne Cleveland, MD [facility]  . IR GENERIC HISTORICAL  12/15/2015   IR US GUIDE VASC ACCESS RIGHT 12/15/2015 Arne Cleveland, MD [facility]  . IR GENERIC HISTORICAL  12/28/2015   IR FLUORO GUIDE CV LINE RIGHT 12/28/2015 Marybelle Killings, MD [facility]  . IR GENERIC HISTORICAL  12/28/2015   IR US GUIDE VASC ACCESS RIGHT 12/28/2015 Marybelle Killings, MD [facility]  . LEFT A ND RIGHT HEART CATH  01/30/2013   DR Sung Amabile  . LEFT AND RIGHT HEART CATHETERIZATION WITH CORONARY ANGIOGRAM N/A 01/30/2013   Procedure: LEFT AND RIGHT HEART CATHETERIZATION WITH CORONARY ANGIOGRAM;  Surgeon: Jolaine Artist, MD;  Location: [facility] CATH LAB;  Service: Cardiovascular;  Laterality: N/A;  . PERIPHERAL VASCULAR CATHETERIZATION Right 01/26/2015   Procedure: A/V Fistulagram;  Surgeon: Angelia Mould, MD;  Location: [facility] CV LAB;  Service: Cardiovascular;  Laterality: Right;  . reapea urethral surgery for recurrent obstruction  2011  . TOTAL KNEE ARTHROPLASTY Right 2007  . VEIN HARVEST Right 03/31/2016    Procedure: RIGHT GREATER SAPHENOUS VEIN HARVEST;  Surgeon: Angelia Mould, MD;  Location: [facility];  Service: Vascular;  Laterality: Right;    There were no vitals filed for this visit.     09/22/16 1450  Symptoms/Limitations  Subjective No new complaints. No falls to report. Does have "a spot" on the end of his limb.  Pertinent History Rt TTA, ESRD, DM, HTN, peripheral neuropathy, cardiomyopathy, CHF, COPD, AVF sg, R total knee replacement 2007  Limitations Lifting;Standing;Walking;House hold activities  Patient Stated Goals Walk without assistance or an AD in the community  Pain Assessment  Currently in Pain? Yes  Pain Score 4  Pain Location Leg (distal end of limb)  Pain Orientation Right  Pain Descriptors / Indicators Aching;Sore;Tender  Pain Type Acute pain  Pain Onset More than a month ago  Pain Frequency Intermittent  Aggravating Factors  pressure from prosthesis  Pain Relieving Factors rest, sitting down, sock ply adjustment      09/22/16 1457  Transfers  Transfers Sit to Stand;Stand to Sit  Sit to Stand 5: Supervision  Stand to Sit 5: Supervision  Ambulation/Gait  Ambulation/Gait Yes  Ambulation/Gait Assistance 4:  Min guard;5: Supervision  Ambulation/Gait Assistance Details min guard assist on outdoor surfaces for safety, otherwise supervision with gait.   Ambulation Distance (Feet) 500 Feet  Assistive device Straight cane  Gait Pattern Step-through pattern;Decreased stance time - right;Decreased step length - left;Narrow base of support;Antalgic  Ambulation Surface Level;Indoor;Unlevel;Outdoor;Paved  High Level Balance  High Level Balance Activities Figure 8 turns;Side stepping;Marching forwards;Backward walking;Negotiating over obstacles  High Level Balance Comments with cane: forward stepping over 4 bolsters of varied heights with min guard assist x 4 laps, improved carryover of correct sequencing today vs previous session; figure 8's around 2 hoola hoops with  cane- demo'd technique for pelvic positioing/step length prior to pt performance with min guard assist and occasional cues needed x 3 laps; at counter: side stepping x 4 laps with cues on form/technique, then forward marching with backwards walking x 4 laps. no to light UE support on counter. min guard to min assist for balance with cues on posture, base of support and weight shifting.                             Prosthetics  Current prosthetic wear tolerance (days/week)  daily  Current prosthetic wear tolerance (#hours/day)  all awake hours drying as needed  Residual limb condition  small open blister at distal end along incision line. no suture palpable. (.3 cm length by .5 cm height). covered with tegaderm with education to pt on how to apply and use for wound management.   Education Provided Residual limb care;Correct ply sock adjustment;Proper wear schedule/adjustment;Proper weight-bearing schedule/adjustment  Person(s) Educated Patient  Education Method Explanation;Demonstration;Verbal cues  Education Method Verbalized understanding;Returned demonstration;Verbal cues required;Needs further instruction  Donning Prosthesis 5  Doffing Prosthesis 5           PT Short Term Goals - 09/20/16 1539      PT SHORT TERM GOAL #1   Title Patient tolerates prosthesis wear >10hrs total /day with no skin issues & limb pain </= 6/10    Baseline 09/20/16: met today   Status Achieved     PT SHORT TERM GOAL #2   Title Patient verbalizes proper cleaning, weighing & wearing at dialysis.    Baseline 09/20/16: met today   Status Achieved     PT SHORT TERM GOAL #3   Title Patient ambulates 300' with RW or crutches & prosthesis with supervision.    Baseline 09/20/16: 500 feet with cane/prosthesis on indoor/outdoor surfaces with supervision   Time --   Period --   Status Achieved     PT SHORT TERM GOAL #4   Title Patient negotiates ramps, curbs with RW or crutches & stairs with 1 rail with supervision.     Baseline 09/20/16: met today with cane/prosthesis for ramp/curb and rail/cane for stairs   Time --   Period --   Status Achieved     PT SHORT TERM GOAL #5   Title Patient picks up objects from floor without UE support safely.    Baseline 09/20/16: met today    Time --   Period --   Status Achieved           PT Long Term Goals - 09/06/16 1909      PT LONG TERM GOAL #1   Title Patient verbalizes proper prosthetic care including wear/use at dialysis to enable safe use of prosthesis.    Time 12   Period Weeks   Status On-going   Target  Date 11/18/16     PT LONG TERM GOAL #2   Title Patient tolerates wear of prosthesis >90% of awake hours with no skin issues & limb pain </= 2/10   Time 12   Period Weeks   Status On-going   Target Date 11/18/16     PT LONG TERM GOAL #3   Title Patient ambulates 104' around household furniture carrying plate & cup with prosthesis only modified independent.    Time 12   Period Weeks   Status On-going   Target Date 11/18/16     PT LONG TERM GOAL #4   Title Berg Balance >45/56 to indicate lower fall risk.    Time 12   Period Weeks   Status On-going   Target Date 11/18/16     PT LONG TERM GOAL #5   Title Patient ambulates 500' outdoors including grass with cane & prosthesis modified independent to enable community mobility.    Time 12   Period Weeks   Status On-going   Target Date 11/18/16     PT LONG TERM GOAL #6   Title Patient negotiates ramps, curbs & stairs (1 rail) with cane & prosthesis modified independent for community access.    Time 12   Period Weeks   Status On-going   Target Date 11/18/16     PT LONG TERM GOAL #7   Title Patient able to lift/ carry 15# box, push /pull items & negotiate around/over ostacles with prosthesis modified independent for household tasks.    Time 12   Period Weeks   Status On-going   Target Date 11/18/16        09/22/16 1457  Plan  Clinical Impression Statement Today's skilled session  continued to address mobility and balance with prosthesis. Pt is making steady progress and should benefit from continued PT to progress toward unmet goals.   PT Treatment/Interventions ADLs/Self Care Home Management;DME Instruction;Gait training;Stair training;Functional mobility training;Therapeutic activities;Therapeutic exercise;Balance training;Neuromuscular re-education;Patient/family education;Prosthetic Training  PT Next Visit Plan continue to work on gait with cane, including barriers with cane and outdoor surfaces as weather allows, continue to work on balance reactions as well  Consulted and Agree with Plan of Care Patient          Patient will benefit from skilled therapeutic intervention in order to improve the following deficits and impairments:     Visit Diagnosis: Other abnormalities of gait and mobility  Unsteadiness on feet  Muscle weakness (generalized)     Problem List Patient Active Problem List   Diagnosis Date Noted  . Onychomycosis 08/16/2016  . Status post unilateral below knee amputation, right ([place]) 04/26/2016  . Cutaneous abscess of right foot 04/19/2016  . Diabetic foot infection ([place])   . Diabetic foot ulcer ([place]) 03/30/2016  . Bilateral carotid bruits 03/30/2016  . Diabetes mellitus with complication ([place])   . Anemia due to chronic kidney disease   . ESRD (end stage renal disease) on dialysis ([place]) 11/30/2015  . Acute respiratory failure with hypoxia ([place]) 11/29/2015  . At high risk for falls 09/29/2014  . Peripheral vascular disease ([place]) 09/29/2014  . Osteoarthritis of both knees 01/07/2014  . Osteoarthritis of multiple joints 10/06/2013  . NICM (nonischemic cardiomyopathy) ([place]) 06/27/2013  . CAD (coronary artery disease) 03/13/2013  . Abnormal nuclear stress test 01/27/2013  . Chronic systolic congestive heart failure ([place]) 01/27/2013  . COPD (chronic obstructive pulmonary disease) ([place]) 12/27/2012  . Diabetic polyneuropathy associated  with type 2 diabetes mellitus ([place]) 02/10/2012  .  PYELITIS/PYELONEPHRITIS DISEASES CLASSIFIED ELSW 09/28/2009  . URINARY CALCULUS 09/28/2009  . Adjustment disorder with depressed mood 08/21/2009  . Poorly controlled type II diabetes mellitus with renal complication ([place]) 15/05/6977  . Hyperlipidemia 04/14/2008  . Essential hypertension 04/14/2008  . ALLERGIC RHINITIS 04/14/2008    Willow Ora, PTA, [facility] [address] [place], [place] [address] [phone] 09/24/16, 5:53 PM   Name: Anthony Bullock MRN: [id number] Date of Birth: Aug 13, 1960

## 2016-09-27 ENCOUNTER — Ambulatory Visit: Payer: BLUE CROSS/BLUE SHIELD | Admitting: Physical Therapy

## 2016-09-27 ENCOUNTER — Encounter: Payer: Self-pay | Admitting: Physical Therapy

## 2016-09-27 DIAGNOSIS — R2689 Other abnormalities of gait and mobility: Secondary | ICD-10-CM | POA: Diagnosis not present

## 2016-09-27 DIAGNOSIS — M6281 Muscle weakness (generalized): Secondary | ICD-10-CM

## 2016-09-27 DIAGNOSIS — R2681 Unsteadiness on feet: Secondary | ICD-10-CM

## 2016-09-28 NOTE — Therapy (Signed)
[facility] [address] [place], [place], [address] Phone: [phone]   Fax:  [phone]  Physical Therapy Treatment  Patient Details  Name: Anthony Bullock MRN: [id number] Date of Birth: 06-06-1960 Referring Provider: Meridee Score, MD  Encounter Date: 09/27/2016      PT End of Session - 09/27/16 1935    Visit Number 9   Number of Visits 25   Date for PT Re-Evaluation 11/18/16   Authorization Type BCBS   Authorization Time Period $650 deductible met, 20% coinsurance, 60 visit comb limit, 0 authorization,    PT Start Time 1530   PT Stop Time 1612   PT Time Calculation (min) 42 min   Equipment Utilized During Treatment Gait belt   Activity Tolerance Patient tolerated treatment well   Behavior During Therapy WFL for tasks assessed/performed      Past Medical History:  Diagnosis Date  . 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 Allergic rhinitis   . Anemia   . Arthritis   . Chronic systolic heart failure ([place])    a. ECHO (12/2012) EF 25-30%, HK entireanteroseptal myocardium //  b.  EF 25%, diffuse HK, grade 1 diastolic dysfunction, MAC, mild LAE, normal RVSF, trivial pericardial effusion  . COPD (chronic obstructive pulmonary disease) ([place])   . Diabetes mellitus type II   . Diabetic nephropathy ([place])   . Diabetic neuropathy ([place])   . ESRD on hemodialysis [facility])    started HD June 2017, goes to [facility] HD unit, Dr Hinda Lenis  . History of cardiac catheterization    a.Myoview 1/15:  There is significant left ventricular dysfunction. There may be slight scar at the apex. There is no significant ischemia. LV Ejection Fraction: 27%  //  b. RHC/LHC (1/15) with mean RA 6, PA 47/22 mean 33, mean PCWP 20, PVR 2.5 WU, CI 2.5; 80% dLAD stenosis, 70% diffuse large D.   . History of kidney stones   . Hyperlipidemia   . Hypertension   . Kidney stones   . NICM (nonischemic cardiomyopathy)  ([place])    Primarily nonischemic. Echo (12/14) with EF 25-30%. Echo (3/15) with EF 25%, mild to moderately dilated LV, normal RV size and systolic function.   . Pneumonia   . Urethral stricture     Past Surgical History:  Procedure Laterality Date  . ABDOMINAL AORTOGRAM W/LOWER EXTREMITY N/A 03/30/2016   Procedure: Abdominal Aortogram w/Lower Extremity;  Surgeon: Angelia Mould, MD;  Location: [facility] CV [facility];  Service: Cardiovascular;  Laterality: N/A;  . AMPUTATION Right 04/26/2016   Procedure: Right Below Knee Amputation;  Surgeon: Newt Minion, MD;  Location: [facility];  Service: Orthopedics;  Laterality: Right;  . AV FISTULA PLACEMENT Right 09/08/2015   Procedure: INSERTION OF 4-8m x 45cm  ARTERIOVENOUS (AV) GORE-TEX GRAFT RIGHT UPPER  ARM;  Surgeon: CAngelia Mould MD;  Location[facility]  Service: Vascular;  Laterality: Right;  . AV FISTULA PLACEMENT Left 01/14/2016   Procedure: CREATION OF LEFT UPPER ARM ARTERIOVENOUS FISTULA;  Surgeon: CAngelia Mould MD;  [facility]  Service: Vascular;  Laterality: Left;  . BASCILIC VEIN TRANSPOSITION Right 08/22/2014   Procedure: RIGHT UPPER ARM BASCILIC VEIN TRANSPOSITION;  Surgeon: CAngelia Mould MD;  [facility]  Service: Vascular;  Laterality: Right;  . BELOW KNEE LEG AMPUTATION Right 04/26/2016  . CARDIAC CATHETERIZATION    . CARDIAC DEFIBRILLATOR PLACEMENT  06/27/2013   Sub Q  BY DR Caryl Comes  . COLONOSCOPY WITH PROPOFOL N/A 07/22/2015   Procedure: COLONOSCOPY WITH PROPOFOL;  Surgeon: Doran Stabler, MD;  Location: [facility];  Service: Gastroenterology;  Laterality: N/A;  . FEMORAL-POPLITEAL BYPASS GRAFT Right 03/31/2016   Procedure: BYPASS GRAFT FEMORAL-POPLITEAL ARTERY USING RIGHT GREATER SAPHENOUS NONREVERSED VEIN;  Surgeon: Angelia Mould, MD;  Location: [facility];  Service: Vascular;  Laterality: Right;  . HERNIA REPAIR    . I&D EXTREMITY Right 03/31/2016   Procedure: IRRIGATION AND DEBRIDEMENT  FOOT;  Surgeon: Angelia Mould, MD;  Location: [facility];  Service: Vascular;  Laterality: Right;  . IMPLANTABLE CARDIOVERTER DEFIBRILLATOR IMPLANT N/A 06/27/2013   Procedure: SUB Q ICD;  Surgeon: Deboraha Sprang, MD;  Location[facility] LAB;  Service: Cardiovascular;  Laterality: N/A;  . INTRAOPERATIVE ARTERIOGRAM Right 03/31/2016   Procedure: INTRA OPERATIVE ARTERIOGRAM;  Surgeon: Angelia Mould, MD;  Location: [facility];  Service: Vascular;  Laterality: Right;  . IR GENERIC HISTORICAL Right 11/30/2015   IR THROMBECTOMY AV FISTULA W/THROMBOLYSIS/PTA INC/SHUNT/IMG RIGHT 11/30/2015 Aletta Edouard, MD [facility]  . IR GENERIC HISTORICAL  11/30/2015   IR US GUIDE VASC ACCESS RIGHT 11/30/2015 Aletta Edouard, MD [facility]  . IR GENERIC HISTORICAL Right 12/15/2015   IR THROMBECTOMY AV FISTULA W/THROMBOLYSIS/PTA/STENT INC/SHUNT/IMG RT 12/15/2015 Arne Cleveland, MD [facility]  . IR GENERIC HISTORICAL  12/15/2015   IR US GUIDE VASC ACCESS RIGHT 12/15/2015 Arne Cleveland, MD [facility]  . IR GENERIC HISTORICAL  12/28/2015   IR FLUORO GUIDE CV LINE RIGHT 12/28/2015 Marybelle Killings, MD [facility]  . IR GENERIC HISTORICAL  12/28/2015   IR US GUIDE VASC ACCESS RIGHT 12/28/2015 Marybelle Killings, MD [facility]  . LEFT A ND RIGHT HEART CATH  01/30/2013   DR Sung Amabile  . LEFT AND RIGHT HEART CATHETERIZATION WITH CORONARY ANGIOGRAM N/A 01/30/2013   Procedure: LEFT AND RIGHT HEART CATHETERIZATION WITH CORONARY ANGIOGRAM;  Surgeon: Jolaine Artist, MD;  Location: [facility] CATH LAB;  Service: Cardiovascular;  Laterality: N/A;  . PERIPHERAL VASCULAR CATHETERIZATION Right 01/26/2015   Procedure: A/V Fistulagram;  Surgeon: Angelia Mould, MD;  Location: [facility];  Service: Cardiovascular;  Laterality: Right;  . reapea urethral surgery for recurrent obstruction  2011  . TOTAL KNEE ARTHROPLASTY Right 2007  . VEIN HARVEST Right 03/31/2016   Procedure: RIGHT GREATER SAPHENOUS VEIN HARVEST;  Surgeon:  Angelia Mould, MD;  Location: [facility];  Service: Vascular;  Laterality: Right;    There were no vitals filed for this visit.      Subjective Assessment - 09/27/16 1534    Subjective (P)  He got a spot on bottom of the bone Saturday. He did not wear prosthesis Sunday. Otherwise wearing all awake hours daily including dialysis.    Pertinent History (P)  Rt TTA, ESRD, DM, HTN, peripheral neuropathy, cardiomyopathy, CHF, COPD, AVF sg, R total knee replacement 2007   Limitations (P)  Lifting;Standing;Walking;House hold activities   Patient Stated Goals (P)  Walk without assistance or an AD in the community   Currently in Pain? (P)  Yes   Pain Score (P)  5    Pain Location (P)  Leg  residual limb   Pain Orientation (P)  Right;Distal   Pain Descriptors / Indicators (P)  Sore;Aching;Tender   Pain Type (P)  Acute pain   Pain Frequency (P)  Intermittent   Aggravating Factors  (P)  pressure from prosthesis   Pain Relieving Factors (P)  resting, sitting down  [facility] Adult PT Treatment/Exercise - 09/27/16 1530      Transfers   Transfers Sit to Stand;Stand to Sit   Sit to Stand 5: Supervision;With upper extremity assist;With armrests;From chair/3-in-1  no UE support to stabilize   Stand to Sit 5: Supervision;With upper extremity assist;With armrests;To chair/3-in-1  No UE support to stabilize     Ambulation/Gait   Ambulation/Gait Yes   Ambulation/Gait Assistance 5: Supervision   Ambulation/Gait Assistance Details verbal & tactile cues on weight shift on prosthesis, step width (decr. abduction) and upright posture   Ambulation Distance (Feet) 500 Feet   Assistive device Straight cane   Gait Pattern Step-through pattern;Decreased stance time - right;Decreased step length - left;Narrow base of support;Antalgic   Ambulation Surface Indoor;Level   Ramp 5: Supervision  cane & prosthesis   Curb 5: Supervision  cane & prosthesis     High Level  Balance   High Level Balance Activities Side stepping;Backward walking;Negotiating over obstacles;Negotitating around obstacles   High Level Balance Comments UE touch on wall: weight shift for different directions of movement, verbal cues on technique with prosthesis stepping over objects.                                Therapeutic Activites    Therapeutic Activities Lifting;Work Simulation;ADL's   ADL's PT demo, instructed in sweeping / mopping with weight shift between feet / technique. Pt return demo understandig with verbal cues.    Lifting PT demo, instructed in technique with prosthesis including lifting boxes. Pt return demo picking cane up from floor with supervision and 20# crate with min guard for balance losses with verbal cues.    Work Scientist, water quality, Dispensing optician for weed eating with lateral weight shifts. Pt return demo understanding with Min Guard / tactile cues & verbal cues.      Prosthetics   Prosthetic Care Comments  Using mower with right foot gas/brake and driving with Rt TTA prosthesis. Use of Tegaderm with minimal to no thickness compared to Bandaid over "spot"    Current prosthetic wear tolerance (days/week)  daily   Current prosthetic wear tolerance (#hours/day)  all awake hours drying as needed   Residual limb condition  dry area on scab. 29m or less dark spot that could be suture starting to work out. Will monitor & use Tegaderm.    Education Provided Residual limb care;Correct ply sock adjustment;Proper wear schedule/adjustment;Proper weight-bearing schedule/adjustment;Skin check;Other (comment)  see prosthetic care                  PT Short Term Goals - 09/20/16 1539      PT SHORT TERM GOAL #1   Title Patient tolerates prosthesis wear >10hrs total /day with no skin issues & limb pain </= 6/10    Baseline 09/20/16: met today   Status Achieved     PT SHORT TERM GOAL #2   Title Patient verbalizes proper cleaning, weighing & wearing at  dialysis.    Baseline 09/20/16: met today   Status Achieved     PT SHORT TERM GOAL #3   Title Patient ambulates 300' with RW or crutches & prosthesis with supervision.    Baseline 09/20/16: 500 feet with cane/prosthesis on indoor/outdoor surfaces with supervision   Time --   Period --   Status Achieved     PT SHORT TERM GOAL #4   Title Patient negotiates ramps, curbs with RW or crutches & stairs  with 1 rail with supervision.    Baseline 09/20/16: met today with cane/prosthesis for ramp/curb and rail/cane for stairs   Time --   Period --   Status Achieved     PT SHORT TERM GOAL #5   Title Patient picks up objects from floor without UE support safely.    Baseline 09/20/16: met today    Time --   Period --   Status Achieved           PT Long Term Goals - 09/06/16 1909      PT LONG TERM GOAL #1   Title Patient verbalizes proper prosthetic care including wear/use at dialysis to enable safe use of prosthesis.    Time 12   Period Weeks   Status On-going   Target Date 11/18/16     PT LONG TERM GOAL #2   Title Patient tolerates wear of prosthesis >90% of awake hours with no skin issues & limb pain </= 2/10   Time 12   Period Weeks   Status On-going   Target Date 11/18/16     PT LONG TERM GOAL #3   Title Patient ambulates 84' around household furniture carrying plate & cup with prosthesis only modified independent.    Time 12   Period Weeks   Status On-going   Target Date 11/18/16     PT LONG TERM GOAL #4   Title Berg Balance >45/56 to indicate lower fall risk.    Time 12   Period Weeks   Status On-going   Target Date 11/18/16     PT LONG TERM GOAL #5   Title Patient ambulates 500' outdoors including grass with cane & prosthesis modified independent to enable community mobility.    Time 12   Period Weeks   Status On-going   Target Date 11/18/16     PT LONG TERM GOAL #6   Title Patient negotiates ramps, curbs & stairs (1 rail) with cane & prosthesis modified  independent for community access.    Time 12   Period Weeks   Status On-going   Target Date 11/18/16     PT LONG TERM GOAL #7   Title Patient able to lift/ carry 15# box, push /pull items & negotiate around/over ostacles with prosthesis modified independent for household tasks.    Time 12   Period Weeks   Status On-going   Target Date 11/18/16               Plan - 09/27/16 1840    Clinical Impression Statement Today's session focused on lifting, carrying, pushing, pulling and direction changes. Patient improved with skilled care but needs further instruction.    Rehab Potential Good   PT Frequency 2x / week   PT Duration 12 weeks   PT Treatment/Interventions ADLs/Self Care Home Management;DME Instruction;Gait training;Stair training;Functional mobility training;Therapeutic activities;Therapeutic exercise;Balance training;Neuromuscular re-education;Patient/family education;Prosthetic Training   PT Next Visit Plan Do G-Code based on prosthetic care, work simulation tasks, continue to work on gait with cane, including barriers with cane and outdoor surfaces as weather allows, continue to work on balance reactions as well   Consulted and Agree with Plan of Care Patient      Patient will benefit from skilled therapeutic intervention in order to improve the following deficits and impairments:  Abnormal gait, Decreased activity tolerance, Decreased balance, Decreased knowledge of use of DME, Decreased mobility, Postural dysfunction, Prosthetic Dependency  Visit Diagnosis: Other abnormalities of gait and mobility  Unsteadiness on feet  Muscle  weakness (generalized)     Problem List Patient Active Problem List   Diagnosis Date Noted  . Onychomycosis 08/16/2016  . Status post unilateral below knee amputation, right ([place]) 04/26/2016  . Cutaneous abscess of right foot 04/19/2016  . Diabetic foot infection ([place])   . Diabetic foot ulcer ([place]) 03/30/2016  . Bilateral carotid  bruits 03/30/2016  . Diabetes mellitus with complication ([place])   . Anemia due to chronic kidney disease   . ESRD (end stage renal disease) on dialysis ([place]) 11/30/2015  . Acute respiratory failure with hypoxia ([place]) 11/29/2015  . At high risk for falls 09/29/2014  . Peripheral vascular disease ([place]) 09/29/2014  . Osteoarthritis of both knees 01/07/2014  . Osteoarthritis of multiple joints 10/06/2013  . NICM (nonischemic cardiomyopathy) ([place]) 06/27/2013  . CAD (coronary artery disease) 03/13/2013  . Abnormal nuclear stress test 01/27/2013  . Chronic systolic congestive heart failure ([place]) 01/27/2013  . COPD (chronic obstructive pulmonary disease) ([place]) 12/27/2012  . Diabetic polyneuropathy associated with type 2 diabetes mellitus ([place]) 02/10/2012  . PYELITIS/PYELONEPHRITIS DISEASES CLASSIFIED ELSW 09/28/2009  . URINARY CALCULUS 09/28/2009  . Adjustment disorder with depressed mood 08/21/2009  . Poorly controlled type II diabetes mellitus with renal complication ([place]) 35/68/6168  . Hyperlipidemia 04/14/2008  . Essential hypertension 04/14/2008  . ALLERGIC RHINITIS 04/14/2008    Jamey Reas PT, DPT 09/28/2016, 1:43 PM  [facility] [address] [place], [place], [address] Phone: [phone]   Fax:  [phone]  Name: JOANNE BRANDER MRN: [id number] Date of Birth: Aug 24, 1960

## 2016-09-29 ENCOUNTER — Ambulatory Visit: Payer: BLUE CROSS/BLUE SHIELD | Admitting: Physical Therapy

## 2016-09-29 ENCOUNTER — Encounter: Payer: Self-pay | Admitting: Physical Therapy

## 2016-09-29 ENCOUNTER — Encounter: Payer: Self-pay | Admitting: Family Medicine

## 2016-09-29 DIAGNOSIS — R2689 Other abnormalities of gait and mobility: Secondary | ICD-10-CM | POA: Diagnosis not present

## 2016-09-29 DIAGNOSIS — M6281 Muscle weakness (generalized): Secondary | ICD-10-CM

## 2016-09-29 DIAGNOSIS — R2681 Unsteadiness on feet: Secondary | ICD-10-CM

## 2016-09-30 NOTE — Therapy (Signed)
[facility] [address] [place], [place], [address] Phone: [phone]   Fax:  [phone]  Physical Therapy Treatment  Patient Details  Name: Anthony Bullock MRN: [id number] Date of Birth: 1960/06/17 Referring Provider: Meridee Score, MD  Encounter Date: 09/29/2016      PT End of Session - 09/29/16 1415    Visit Number 10   Number of Visits 25   Date for PT Re-Evaluation 11/18/16   Authorization Type BCBS   Authorization Time Period $650 deductible met, 20% coinsurance, 60 visit comb limit, 0 authorization,    PT Start Time 1405   PT Stop Time 1445   PT Time Calculation (min) 40 min   Equipment Utilized During Treatment Gait belt   Activity Tolerance Patient tolerated treatment well   Behavior During Therapy WFL for tasks assessed/performed      Past Medical History:  Diagnosis Date  . 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 Allergic rhinitis   . Anemia   . Arthritis   . Chronic systolic heart failure ([place])    a. ECHO (12/2012) EF 25-30%, HK entireanteroseptal myocardium //  b.  EF 25%, diffuse HK, grade 1 diastolic dysfunction, MAC, mild LAE, normal RVSF, trivial pericardial effusion  . COPD (chronic obstructive pulmonary disease) ([place])   . Diabetes mellitus type II   . Diabetic nephropathy ([place])   . Diabetic neuropathy ([place])   . ESRD on hemodialysis [facility])    started HD June 2017, goes to [facility] HD unit, Dr Hinda Lenis  . History of cardiac catheterization    a.Myoview 1/15:  There is significant left ventricular dysfunction. There may be slight scar at the apex. There is no significant ischemia. LV Ejection Fraction: 27%  //  b. RHC/LHC (1/15) with mean RA 6, PA 47/22 mean 33, mean PCWP 20, PVR 2.5 WU, CI 2.5; 80% dLAD stenosis, 70% diffuse large D.   . History of kidney stones   . Hyperlipidemia   . Hypertension   . Kidney stones   . NICM (nonischemic cardiomyopathy)  ([place])    Primarily nonischemic. Echo (12/14) with EF 25-30%. Echo (3/15) with EF 25%, mild to moderately dilated LV, normal RV size and systolic function.   . Pneumonia   . Urethral stricture     Past Surgical History:  Procedure Laterality Date  . ABDOMINAL AORTOGRAM W/LOWER EXTREMITY N/A 03/30/2016   Procedure: Abdominal Aortogram w/Lower Extremity;  Surgeon: Angelia Mould, MD;  Location: [facility];  Service: Cardiovascular;  Laterality: N/A;  . AMPUTATION Right 04/26/2016   Procedure: Right Below Knee Amputation;  Surgeon: Newt Minion, MD;  Location: [facility];  Service: Orthopedics;  Laterality: Right;  . AV FISTULA PLACEMENT Right 09/08/2015   Procedure: INSERTION OF 4-26m x 45cm  ARTERIOVENOUS (AV) GORE-TEX GRAFT RIGHT UPPER  ARM;  Surgeon: CAngelia Mould MD;  [place] [facility]  Service: Vascular;  Laterality: Right;  . AV FISTULA PLACEMENT Left 01/14/2016   Procedure: CREATION OF LEFT UPPER ARM ARTERIOVENOUS FISTULA;  Surgeon: CAngelia Mould MD;  [place] [facility]  Service: Vascular;  Laterality: Left;  . BASCILIC VEIN TRANSPOSITION Right 08/22/2014   Procedure: RIGHT UPPER ARM BASCILIC VEIN TRANSPOSITION;  Surgeon: CAngelia Mould MD;  [facility]  Service: Vascular;  Laterality: Right;  . BELOW KNEE LEG AMPUTATION Right 04/26/2016  . CARDIAC CATHETERIZATION    . CARDIAC DEFIBRILLATOR PLACEMENT  06/27/2013   Sub Q  BY DR Caryl Comes  . COLONOSCOPY WITH PROPOFOL N/A 07/22/2015   Procedure: COLONOSCOPY WITH PROPOFOL;  Surgeon: Doran Stabler, MD;  Location: [facility];  Service: Gastroenterology;  Laterality: N/A;  . FEMORAL-POPLITEAL BYPASS GRAFT Right 03/31/2016   Procedure: BYPASS GRAFT FEMORAL-POPLITEAL ARTERY USING RIGHT GREATER SAPHENOUS NONREVERSED VEIN;  Surgeon: Angelia Mould, MD;  Location: [facility];  Service: Vascular;  Laterality: Right;  . HERNIA REPAIR    . I&D EXTREMITY Right 03/31/2016   Procedure: IRRIGATION AND DEBRIDEMENT  FOOT;  Surgeon: Angelia Mould, MD;  Location: [facility];  Service: Vascular;  Laterality: Right;  . IMPLANTABLE CARDIOVERTER DEFIBRILLATOR IMPLANT N/A 06/27/2013   Procedure: SUB Q ICD;  Surgeon: Deboraha Sprang, MD;  Location: [facility] CATH LAB;  Service: Cardiovascular;  Laterality: N/A;  . INTRAOPERATIVE ARTERIOGRAM Right 03/31/2016   Procedure: INTRA OPERATIVE ARTERIOGRAM;  Surgeon: Angelia Mould, MD;  Location: [facility];  Service: Vascular;  Laterality: Right;  . IR GENERIC HISTORICAL Right 11/30/2015   IR THROMBECTOMY AV FISTULA W/THROMBOLYSIS/PTA INC/SHUNT/IMG RIGHT 11/30/2015 Aletta Edouard, MD [facility]  . IR GENERIC HISTORICAL  11/30/2015   IR US GUIDE VASC ACCESS RIGHT 11/30/2015 Aletta Edouard, MD [facility]  . IR GENERIC HISTORICAL Right 12/15/2015   IR THROMBECTOMY AV FISTULA W/THROMBOLYSIS/PTA/STENT INC/SHUNT/IMG RT 12/15/2015 Arne Cleveland, MD [facility]  . IR GENERIC HISTORICAL  12/15/2015   IR US GUIDE VASC ACCESS RIGHT 12/15/2015 Arne Cleveland, MD [facility]  . IR GENERIC HISTORICAL  12/28/2015   IR FLUORO GUIDE CV LINE RIGHT 12/28/2015 Marybelle Killings, MD [facility]  . IR GENERIC HISTORICAL  12/28/2015   IR US GUIDE VASC ACCESS RIGHT 12/28/2015 Marybelle Killings, MD [facility]  . LEFT A ND RIGHT HEART CATH  01/30/2013   DR Sung Amabile  . LEFT AND RIGHT HEART CATHETERIZATION WITH CORONARY ANGIOGRAM N/A 01/30/2013   Procedure: LEFT AND RIGHT HEART CATHETERIZATION WITH CORONARY ANGIOGRAM;  Surgeon: Jolaine Artist, MD;  Location: [facility] CATH LAB;  Service: Cardiovascular;  Laterality: N/A;  . PERIPHERAL VASCULAR CATHETERIZATION Right 01/26/2015   Procedure: A/V Fistulagram;  Surgeon: Angelia Mould, MD;  Location: [facility];  Service: Cardiovascular;  Laterality: Right;  . reapea urethral surgery for recurrent obstruction  2011  . TOTAL KNEE ARTHROPLASTY Right 2007  . VEIN HARVEST Right 03/31/2016   Procedure: RIGHT GREATER SAPHENOUS VEIN HARVEST;  Surgeon:  Angelia Mould, MD;  Location: [facility];  Service: Vascular;  Laterality: Right;    There were no vitals filed for this visit.      Subjective Assessment - 09/29/16 1407    Subjective No new complaitns. No falls to report. Still having pain at the place on the end of his limb.   Pertinent History Rt TTA, ESRD, DM, HTN, peripheral neuropathy, cardiomyopathy, CHF, COPD, AVF sg, R total knee replacement 2007   Limitations Lifting;Standing;Walking;House hold activities   Patient Stated Goals Walk without assistance or an AD in the community   Currently in Pain? Yes   Pain Score 3    Pain Location Leg  distal end   Pain Orientation Right   Pain Descriptors / Indicators Sore;Tender   Pain Type Acute pain   Pain Onset More than a month ago   Pain Frequency Intermittent   Aggravating Factors  pressure from prosthesis   Pain Relieving Factors rest, sitting down, sock ply adjustment             [facility] Adult PT Treatment/Exercise - 09/29/16 1416  Transfers   Transfers Sit to Stand;Stand to Sit   Sit to Stand 6: Modified independent (Device/Increase time);From chair/3-in-1   Stand to Sit 6: Modified independent (Device/Increase time);To chair/3-in-1     Ambulation/Gait   Ambulation/Gait Yes   Ambulation/Gait Assistance 5: Supervision;4: Min guard   Ambulation/Gait Assistance Details occasional cues for cane placement and posture with gait   Ambulation Distance (Feet) 500 Feet   Assistive device Straight cane   Gait Pattern Step-through pattern;Decreased stance time - right;Decreased step length - left;Narrow base of support;Antalgic   Ambulation Surface Level;Unlevel;Indoor;Outdoor;Paved;Gravel;Grass   Gait Comments along ~50 foot hallway: forward gait with cane with head movements right<>fwd<>left x 2 laps, then up<>fwd<>down x 2 laps with min guard to min assist for balance     High Level Balance   High Level Balance Activities Negotitating around obstacles;Negotiating  over obstacles   High Level Balance Comments around hoola hoops on floor, then over 3 bolsters of varied heights x 6 laps with cane and min guard assist. cues on technique for stepping around obstacles and for sequencing with stepping over bolsters.                      Therapeutic Activites    Therapeutic Activities Lifting   Lifting 8# crate to/from floor x 5 reps with min guard to min assist and cues on stance position/weight shifting to assist with balance.  lifting crate from floor, carrying for 15 feet to set on waist level counter top, then lifting from counter top and carring 15 feet to put back on floor. pt had 90* turns at counter top and did a 180* turn at end to put crate back on floor. min guard assit with cues on posture and body mechnics.      Prosthetics   Prosthetic Care Comments  reinforced use of Tegederm over bandaides due to pressure bearing area of socket    Current prosthetic wear tolerance (days/week)  daily   Current prosthetic wear tolerance (#hours/day)  all awake hours drying as needed   Edema none   Residual limb condition  dry area on scab. 41m or less dark spot that could be suture starting to work out. Will monitor & use Tegaderm.              PT Short Term Goals - 09/20/16 1539      PT SHORT TERM GOAL #1   Title Patient tolerates prosthesis wear >10hrs total /day with no skin issues & limb pain </= 6/10    Baseline 09/20/16: met today   Status Achieved     PT SHORT TERM GOAL #2   Title Patient verbalizes proper cleaning, weighing & wearing at dialysis.    Baseline 09/20/16: met today   Status Achieved     PT SHORT TERM GOAL #3   Title Patient ambulates 300' with RW or crutches & prosthesis with supervision.    Baseline 09/20/16: 500 feet with cane/prosthesis on indoor/outdoor surfaces with supervision   Time --   Period --   Status Achieved     PT SHORT TERM GOAL #4   Title Patient negotiates ramps, curbs with RW or crutches & stairs with 1 rail  with supervision.    Baseline 09/20/16: met today with cane/prosthesis for ramp/curb and rail/cane for stairs   Time --   Period --   Status Achieved     PT SHORT TERM GOAL #5   Title Patient picks up objects from floor without  UE support safely.    Baseline 09/20/16: met today    Time --   Period --   Status Achieved           PT Long Term Goals - 09/06/16 1909      PT LONG TERM GOAL #1   Title Patient verbalizes proper prosthetic care including wear/use at dialysis to enable safe use of prosthesis.    Time 12   Period Weeks   Status On-going   Target Date 11/18/16     PT LONG TERM GOAL #2   Title Patient tolerates wear of prosthesis >90% of awake hours with no skin issues & limb pain </= 2/10   Time 12   Period Weeks   Status On-going   Target Date 11/18/16     PT LONG TERM GOAL #3   Title Patient ambulates 24' around household furniture carrying plate & cup with prosthesis only modified independent.    Time 12   Period Weeks   Status On-going   Target Date 11/18/16     PT LONG TERM GOAL #4   Title Berg Balance >45/56 to indicate lower fall risk.    Time 12   Period Weeks   Status On-going   Target Date 11/18/16     PT LONG TERM GOAL #5   Title Patient ambulates 500' outdoors including grass with cane & prosthesis modified independent to enable community mobility.    Time 12   Period Weeks   Status On-going   Target Date 11/18/16     PT LONG TERM GOAL #6   Title Patient negotiates ramps, curbs & stairs (1 rail) with cane & prosthesis modified independent for community access.    Time 12   Period Weeks   Status On-going   Target Date 11/18/16     PT LONG TERM GOAL #7   Title Patient able to lift/ carry 15# box, push /pull items & negotiate around/over ostacles with prosthesis modified independent for household tasks.    Time 12   Period Weeks   Status On-going   Target Date 11/18/16               Plan - 09/29/16 1415    Clinical Impression  Statement Today's skilled session continued to focus on gait and balance with prosthesis/cane. Pt is making steady progress toward goals, limited by continued tibial pain on residual limb with increased weight bearing causing him to need short, frequent rest breaks. Pt is progressing toward goals and should benefit from continued PT to progress toward unmet goals.    Rehab Potential Good   PT Frequency 2x / week   PT Duration 12 weeks   PT Treatment/Interventions ADLs/Self Care Home Management;DME Instruction;Gait training;Stair training;Functional mobility training;Therapeutic activities;Therapeutic exercise;Balance training;Neuromuscular re-education;Patient/family education;Prosthetic Training   PT Next Visit Plan work simulation tasks, continue to work on gait with cane, including barriers with cane and outdoor surfaces as weather allows, continue to work on balance reactions as well   Consulted and Agree with Plan of Care Patient      Patient will benefit from skilled therapeutic intervention in order to improve the following deficits and impairments:  Abnormal gait, Decreased activity tolerance, Decreased balance, Decreased knowledge of use of DME, Decreased mobility, Postural dysfunction, Prosthetic Dependency  Visit Diagnosis: Other abnormalities of gait and mobility  Unsteadiness on feet  Muscle weakness (generalized)     Problem List Patient Active Problem List   Diagnosis Date Noted  . Onychomycosis 08/16/2016  .  Status post unilateral below knee amputation, right ([place]) 04/26/2016  . Cutaneous abscess of right foot 04/19/2016  . Diabetic foot infection ([place])   . Diabetic foot ulcer ([place]) 03/30/2016  . Bilateral carotid bruits 03/30/2016  . Diabetes mellitus with complication ([place])   . Anemia due to chronic kidney disease   . ESRD (end stage renal disease) on dialysis ([place]) 11/30/2015  . Acute respiratory failure with hypoxia ([place]) 11/29/2015  . At high risk for falls  09/29/2014  . Peripheral vascular disease ([place]) 09/29/2014  . Osteoarthritis of both knees 01/07/2014  . Osteoarthritis of multiple joints 10/06/2013  . NICM (nonischemic cardiomyopathy) ([place]) 06/27/2013  . CAD (coronary artery disease) 03/13/2013  . Abnormal nuclear stress test 01/27/2013  . Chronic systolic congestive heart failure ([place]) 01/27/2013  . COPD (chronic obstructive pulmonary disease) ([place]) 12/27/2012  . Diabetic polyneuropathy associated with type 2 diabetes mellitus ([place]) 02/10/2012  . PYELITIS/PYELONEPHRITIS DISEASES CLASSIFIED ELSW 09/28/2009  . URINARY CALCULUS 09/28/2009  . Adjustment disorder with depressed mood 08/21/2009  . Poorly controlled type II diabetes mellitus with renal complication ([place]) 55/37/4827  . Hyperlipidemia 04/14/2008  . Essential hypertension 04/14/2008  . ALLERGIC RHINITIS 04/14/2008    Willow Ora, PTA, [facility] [address] [place], [place] [address] [phone] 09/30/16, 9:28 AM   Name: Anthony Bullock MRN: [id number] Date of Birth: 10/02/1960

## 2016-10-04 ENCOUNTER — Ambulatory Visit: Payer: BLUE CROSS/BLUE SHIELD | Admitting: Physical Therapy

## 2016-10-04 ENCOUNTER — Encounter: Payer: Self-pay | Admitting: Physical Therapy

## 2016-10-04 DIAGNOSIS — R2689 Other abnormalities of gait and mobility: Secondary | ICD-10-CM | POA: Diagnosis not present

## 2016-10-04 DIAGNOSIS — R2681 Unsteadiness on feet: Secondary | ICD-10-CM

## 2016-10-04 DIAGNOSIS — M6281 Muscle weakness (generalized): Secondary | ICD-10-CM

## 2016-10-05 NOTE — Therapy (Signed)
[facility] [address] [place], [place], [address] Phone: [phone]   Fax:  [phone]  Physical Therapy Treatment  Patient Details  Name: Anthony Bullock MRN: [id number] Date of Birth: [birth year] Referring Provider: Meridee Score, MD  Encounter Date: 10/04/2016      PT End of Session - 10/04/16 1538    Visit Number 11   Number of Visits 25   Date for PT Re-Evaluation 11/18/16   Authorization Type BCBS   Authorization Time Period $650 deductible met, 20% coinsurance, 60 visit comb limit, 0 authorization,    PT Start Time 1534   PT Stop Time 1615   PT Time Calculation (min) 41 min   Equipment Utilized During Treatment Gait belt   Activity Tolerance Patient tolerated treatment well   Behavior During Therapy WFL for tasks assessed/performed      Past Medical History:  Diagnosis Date  . 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 Allergic rhinitis   . Anemia   . Arthritis   . Chronic systolic heart failure ([place])    a. ECHO (12/2012) EF 25-30%, HK entireanteroseptal myocardium //  b.  EF 25%, diffuse HK, grade 1 diastolic dysfunction, MAC, mild LAE, normal RVSF, trivial pericardial effusion  . COPD (chronic obstructive pulmonary disease) ([place])   . Diabetes mellitus type II   . Diabetic nephropathy ([place])   . Diabetic neuropathy ([place])   . ESRD on hemodialysis [facility])    started HD June 2017, goes to [facility] HD unit, Dr Hinda Lenis  . History of cardiac catheterization    a.Myoview 1/15:  There is significant left ventricular dysfunction. There may be slight scar at the apex. There is no significant ischemia. LV Ejection Fraction: 27%  //  b. RHC/LHC (1/15) with mean RA 6, PA 47/22 mean 33, mean PCWP 20, PVR 2.5 WU, CI 2.5; 80% dLAD stenosis, 70% diffuse large D.   . History of kidney stones   . Hyperlipidemia   . Hypertension   . Kidney stones   . NICM (nonischemic cardiomyopathy)  ([place])    Primarily nonischemic. Echo (12/14) with EF 25-30%. Echo (3/15) with EF 25%, mild to moderately dilated LV, normal RV size and systolic function.   . Pneumonia   . Urethral stricture     Past Surgical History:  Procedure Laterality Date  . ABDOMINAL AORTOGRAM W/LOWER EXTREMITY N/A 03/30/2016   Procedure: Abdominal Aortogram w/Lower Extremity;  Surgeon: Angelia Mould, MD;  Location: [place] CV LAB;  Service: Cardiovascular;  Laterality: N/A;  . AMPUTATION Right 04/26/2016   Procedure: Right Below Knee Amputation;  Surgeon: Newt Minion, MD;  Location: [place];  Service: Orthopedics;  Laterality: Right;  . AV FISTULA PLACEMENT Right 09/08/2015   Procedure: INSERTION OF 4-85m x 45cm  ARTERIOVENOUS (AV) GORE-TEX GRAFT RIGHT UPPER  ARM;  Surgeon: CAngelia Mould MD;  [place]  Service: Vascular;  Laterality: Right;  . AV FISTULA PLACEMENT Left 01/14/2016   Procedure: CREATION OF LEFT UPPER ARM ARTERIOVENOUS FISTULA;  Surgeon: CAngelia Mould MD;  [place] [facility]  Service: Vascular;  Laterality: Left;  . BASCILIC VEIN TRANSPOSITION Right 08/22/2014   Procedure: RIGHT UPPER ARM BASCILIC VEIN TRANSPOSITION;  Surgeon: CAngelia Mould MD;  [place] [facility]  Service: Vascular;  Laterality: Right;  . BELOW KNEE LEG AMPUTATION Right 04/26/2016  . CARDIAC CATHETERIZATION    . CARDIAC DEFIBRILLATOR PLACEMENT  06/27/2013   Sub Q  BY DR Caryl Comes  . COLONOSCOPY WITH PROPOFOL N/A 07/22/2015   Procedure: COLONOSCOPY WITH PROPOFOL;  Surgeon: Doran Stabler, MD;  [facility];  Service: Gastroenterology;  Laterality: N/A;  . FEMORAL-POPLITEAL BYPASS GRAFT Right 03/31/2016   Procedure: BYPASS GRAFT FEMORAL-POPLITEAL ARTERY USING RIGHT GREATER SAPHENOUS NONREVERSED VEIN;  Surgeon: Angelia Mould, MD;  Location: [place];  Service: Vascular;  Laterality: Right;  . HERNIA REPAIR    . I&D EXTREMITY Right 03/31/2016   Procedure: IRRIGATION AND DEBRIDEMENT  FOOT;  Surgeon: Angelia Mould, MD;  Location: [employer];  Service: Vascular;  Laterality: Right;  . IMPLANTABLE CARDIOVERTER DEFIBRILLATOR IMPLANT N/A 06/27/2013   Procedure: SUB Q ICD;  Surgeon: Deboraha Sprang, MD;  Location: [facility] C[facility];  Service: Cardiovascular;  Laterality: N/A;  . INTRAOPERATIVE ARTERIOGRAM Right 03/31/2016   Procedure: INTRA OPERATIVE ARTERIOGRAM;  Surgeon: Angelia Mould, MD;  Location: [facility];  Service: Vascular;  Laterality: Right;  . IR GENERIC HISTORICAL Right 11/30/2015   IR THROMBECTOMY AV FISTULA W/THROMBOLYSIS/PTA INC/SHUNT/IMG RIGHT 11/30/2015 Aletta Edouard, MD [facility]  . IR GENERIC HISTORICAL  11/30/2015   IR US GUIDE VASC ACCESS RIGHT 11/30/2015 Aletta Edouard, MD [facility]  . IR GENERIC HISTORICAL Right 12/15/2015   IR THROMBECTOMY AV FISTULA W/THROMBOLYSIS/PTA/STENT INC/SHUNT/IMG RT 12/15/2015 Arne Cleveland, MD [facility]  . IR GENERIC HISTORICAL  12/15/2015   IR US GUIDE VASC ACCESS RIGHT 12/15/2015 Arne Cleveland, MD [facility]  . IR GENERIC HISTORICAL  12/28/2015   IR FLUORO GUIDE CV LINE RIGHT 12/28/2015 Marybelle Killings, MD [facility]  . IR GENERIC HISTORICAL  12/28/2015   IR US GUIDE VASC ACCESS RIGHT 12/28/2015 Marybelle Killings, MD [facility]  . LEFT A ND RIGHT HEART CATH  01/30/2013   DR Sung Amabile  . LEFT AND RIGHT HEART CATHETERIZATION WITH CORONARY ANGIOGRAM N/A 01/30/2013   Procedure: LEFT AND RIGHT HEART CATHETERIZATION WITH CORONARY ANGIOGRAM;  Surgeon: Jolaine Artist, MD;  Location[facility];  Service: Cardiovascular;  Laterality: N/A;  . PERIPHERAL VASCULAR CATHETERIZATION Right 01/26/2015   Procedure: A/V Fistulagram;  Surgeon: Angelia Mould, MD;  Location: [place] CV LAB;  Service: Cardiovascular;  Laterality: Right;  . reapea urethral surgery for recurrent obstruction  2011  . TOTAL KNEE ARTHROPLASTY Right 2007  . VEIN HARVEST Right 03/31/2016   Procedure: RIGHT GREATER SAPHENOUS VEIN HARVEST;  Surgeon:  Angelia Mould, MD;  Location: [place];  Service: Vascular;  Laterality: Right;    There were no vitals filed for this visit.      Subjective Assessment - 10/04/16 1536    Subjective No new complaints. No falls to report. Reports the pain on distal end of limb is much better and the wound has healed completely.    Patient is accompained by: Family member   Pertinent History Rt TTA, ESRD, DM, HTN, peripheral neuropathy, cardiomyopathy, CHF, COPD, AVF sg, R total knee replacement 2007   Limitations Lifting;Standing;Walking;House hold activities   Patient Stated Goals Walk without assistance or an AD in the community   Currently in Pain? Yes   Pain Score 2    Pain Location Leg  distal end of limb   Pain Orientation Right   Pain Descriptors / Indicators Sore   Pain Type Acute pain   Pain Onset More than a month ago   Pain Frequency Intermittent   Aggravating Factors  pressure with prosthesis   Pain Relieving Factors rest, sitting down, sock ply adjustment  [facility] Adult PT Treatment/Exercise - 10/04/16 1538      Transfers   Transfers Sit to Stand;Stand to Sit   Sit to Stand 6: Modified independent (Device/Increase time);From chair/3-in-1   Stand to Sit 6: Modified independent (Device/Increase time);To chair/3-in-1     Ambulation/Gait   Ambulation/Gait Yes   Ambulation/Gait Assistance 5: Supervision;4: Min guard   Ambulation/Gait Assistance Details had pt scan enviroment while on outdoor surfaces with no balance issues noted, min guard assist on complint surfaces for safety            Ambulation Distance (Feet) 500 Feet  x1 with cane; 115 no AD   Assistive device Straight cane;None;Prosthesis   Gait Pattern Step-through pattern;Decreased stance time - right;Decreased step length - left;Narrow base of support;Antalgic   Ambulation Surface Level;Indoor;Unlevel;Outdoor;Paved;Gravel;Grass   Stairs Yes   Stair Management Technique Two rails;One rail  Left;Alternating pattern;Step to pattern;With cane   Number of Stairs 4  x4 reps   Ramp 5: Supervision   Ramp Details (indicate cue type and reason) x2 with cane/prosthesis   Curb 5: Supervision   Curb Details (indicate cue type and reason) x2 with cane/prosthesis, cues on correct sequencing      Prosthetics   Current prosthetic wear tolerance (days/week)  daily   Current prosthetic wear tolerance (#hours/day)  all awake hours drying as needed   Residual limb condition  healed with no issues   Donning Prosthesis Supervision   Doffing Prosthesis Supervision             Balance Exercises - 10/04/16 1557      Balance Exercises: Standing   Standing Eyes Closed Narrow base of support (BOS);Head turns;Foam/compliant surface;Other reps (comment);Limitations   Wall Bumps Hip   Wall Bumps-Hips Eyes opened;Anterior/posterior;10 reps;Limitations   Balance Beam standing across red beam: alternating stepping fwd to floor/back onto beam, then alternating bwd stepping to floor/back onto beam. 18-10 reps each with min guard to min assist for balance with intermittent touch to chair/walls     Balance Exercises: Standing   Standing Eyes Closed Limitations pillows in corner with chair in front for safety: EC no head movements, progressing to [facility] for head movements left<>right, up<>down and diagonals both ways.                             Wall Bumps Limitations standing across red beam: cues on posture and weight shifting for correct form, min guard assist.              PT Short Term Goals - 09/20/16 1539      PT SHORT TERM GOAL #1   Title Patient tolerates prosthesis wear >10hrs total /day with no skin issues & limb pain </= 6/10    Baseline 09/20/16: met today   Status Achieved     PT SHORT TERM GOAL #2   Title Patient verbalizes proper cleaning, weighing & wearing at dialysis.    Baseline 09/20/16: met today   Status Achieved     PT SHORT TERM GOAL #3   Title Patient ambulates 300'  with RW or crutches & prosthesis with supervision.    Baseline 09/20/16: 500 feet with cane/prosthesis on indoor/outdoor surfaces with supervision   Time --   Period --   Status Achieved     PT SHORT TERM GOAL #4   Title Patient negotiates ramps, curbs with RW or crutches & stairs with 1 rail with supervision.    Baseline 09/20/16:  met today with cane/prosthesis for ramp/curb and rail/cane for stairs   Time --   Period --   Status Achieved     PT SHORT TERM GOAL #5   Title Patient picks up objects from floor without UE support safely.    Baseline 09/20/16: met today    Time --   Period --   Status Achieved           PT Long Term Goals - 10/05/16 1057      PT LONG TERM GOAL #1   Title Patient verbalizes proper prosthetic care including wear/use at dialysis to enable safe use of prosthesis. (updated target date for all LTGs is 10/21/16)   Time 12   Period Weeks   Status On-going   Target Date 10/21/16     PT LONG TERM GOAL #2   Title Patient tolerates wear of prosthesis >90% of awake hours with no skin issues & limb pain </= 2/10   Time 12   Period Weeks   Status On-going   Target Date 10/21/16     PT LONG TERM GOAL #3   Title Patient ambulates 67' around household furniture carrying plate & cup with prosthesis only modified independent.    Time 12   Period Weeks   Status On-going   Target Date 10/21/16     PT LONG TERM GOAL #4   Title Berg Balance >45/56 to indicate lower fall risk.    Time 12   Period Weeks   Status On-going   Target Date 10/21/16     PT LONG TERM GOAL #5   Title Patient ambulates 500' outdoors including grass with cane & prosthesis modified independent to enable community mobility.    Time 12   Period Weeks   Status On-going   Target Date 10/21/16     PT LONG TERM GOAL #6   Title Patient negotiates ramps, curbs & stairs (1 rail) with cane & prosthesis modified independent for community access.    Time 12   Period Weeks   Status On-going    Target Date 10/21/16     PT LONG TERM GOAL #7   Title Patient able to lift/ carry 15# box, push /pull items & negotiate around/over ostacles with prosthesis modified independent for household tasks.    Time 12   Period Weeks   Status On-going   Target Date 10/21/16            Plan - 10/04/16 1538    Clinical Impression Statement Today's skilled session continued to focus on gait/balance with prosthesis both with cane and without cane. Pt is making great progress toward goals. Discussed goals with primary PT as pt has a 12 week plan of care and new STGs are needed.  Primary PT reviewed case/goals and decided to update the LTG due date to 10/21/16 in anticipation of early discharge due to pt progress. Goal date has been updated to reflect this. Pt should benefit from continued PT to progress toward unmet goals.    Rehab Potential Good   PT Frequency 2x / week   PT Duration 12 weeks   PT Treatment/Interventions ADLs/Self Care Home Management;DME Instruction;Gait training;Stair training;Functional mobility training;Therapeutic activities;Therapeutic exercise;Balance training;Neuromuscular re-education;Patient/family education;Prosthetic Training   PT Next Visit Plan work simulation tasks, continue to work on gait with cane, including barriers with cane and outdoor surfaces as weather allows, continue to work on balance reactions as well   Consulted and Agree with Plan of Care Patient  Patient will benefit from skilled therapeutic intervention in order to improve the following deficits and impairments:  Abnormal gait, Decreased activity tolerance, Decreased balance, Decreased knowledge of use of DME, Decreased mobility, Postural dysfunction, Prosthetic Dependency  Visit Diagnosis: Other abnormalities of gait and mobility  Unsteadiness on feet  Muscle weakness (generalized)     Problem List Patient Active Problem List   Diagnosis Date Noted  . Onychomycosis 08/16/2016  .  Status post unilateral below knee amputation, right ([place]) 04/26/2016  . Cutaneous abscess of right foot 04/19/2016  . Diabetic foot infection ([place])   . Diabetic foot ulcer ([place]) 03/30/2016  . Bilateral carotid bruits 03/30/2016  . Diabetes mellitus with complication ([place])   . Anemia due to chronic kidney disease   . ESRD (end stage renal disease) on dialysis ([place]) 11/30/2015  . Acute respiratory failure with hypoxia ([place]) 11/29/2015  . At high risk for falls 09/29/2014  . Peripheral vascular disease ([place]) 09/29/2014  . Osteoarthritis of both knees 01/07/2014  . Osteoarthritis of multiple joints 10/06/2013  . NICM (nonischemic cardiomyopathy) ([place]) 06/27/2013  . CAD (coronary artery disease) 03/13/2013  . Abnormal nuclear stress test 01/27/2013  . Chronic systolic congestive heart failure ([place]) 01/27/2013  . COPD (chronic obstructive pulmonary disease) ([place]) 12/27/2012  . Diabetic polyneuropathy associated with type 2 diabetes mellitus ([place]) 02/10/2012  . PYELITIS/PYELONEPHRITIS DISEASES CLASSIFIED ELSW 09/28/2009  . URINARY CALCULUS 09/28/2009  . Adjustment disorder with depressed mood 08/21/2009  . Poorly controlled type II diabetes mellitus with renal complication ([place]) 11/06/2534  . Hyperlipidemia 04/14/2008  . Essential hypertension 04/14/2008  . ALLERGIC RHINITIS 04/14/2008    Willow Ora, PTA, [facility] [address] [place], [place] [address] [phone] 10/05/16, 10:59 AM   Name: Anthony Bullock MRN: [id number] Date of Birth: June 19, 1960

## 2016-10-06 ENCOUNTER — Ambulatory Visit: Payer: BLUE CROSS/BLUE SHIELD | Admitting: Physical Therapy

## 2016-10-06 DIAGNOSIS — R2681 Unsteadiness on feet: Secondary | ICD-10-CM

## 2016-10-06 DIAGNOSIS — R2689 Other abnormalities of gait and mobility: Secondary | ICD-10-CM | POA: Diagnosis not present

## 2016-10-07 NOTE — Therapy (Signed)
[facility] [address] [place], [place], [address] Phone: [phone]   Fax:  [phone]  Physical Therapy Treatment  Patient Details  Name: Anthony Bullock MRN: [id number] Date of Birth: [birth year] Referring Provider: Meridee Score, MD  Encounter Date: 10/06/2016      PT End of Session - 10/07/16 0919    Visit Number 12   Number of Visits 25   Date for PT Re-Evaluation 11/18/16   Authorization Type BCBS   Authorization Time Period $650 deductible met, 20% coinsurance, 60 visit comb limit, 0 authorization,    PT Start Time 1450   PT Stop Time 1535   PT Time Calculation (min) 45 min   Equipment Utilized During Treatment Gait belt      Past Medical History:  Diagnosis Date  . 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 Allergic rhinitis   . Anemia   . Arthritis   . Chronic systolic heart failure ([place])    a. ECHO (12/2012) EF 25-30%, HK entireanteroseptal myocardium //  b.  EF 25%, diffuse HK, grade 1 diastolic dysfunction, MAC, mild LAE, normal RVSF, trivial pericardial effusion  . COPD (chronic obstructive pulmonary disease) ([place])   . Diabetes mellitus type II   . Diabetic nephropathy ([place])   . Diabetic neuropathy ([place])   . ESRD on hemodialysis [facility])    started HD June 2017, goes to [facility] unit, Dr Hinda Lenis  . History of cardiac catheterization    a.Myoview 1/15:  There is significant left ventricular dysfunction. There may be slight scar at the apex. There is no significant ischemia. LV Ejection Fraction: 27%  //  b. RHC/LHC (1/15) with mean RA 6, PA 47/22 mean 33, mean PCWP 20, PVR 2.5 WU, CI 2.5; 80% dLAD stenosis, 70% diffuse large D.   . History of kidney stones   . Hyperlipidemia   . Hypertension   . Kidney stones   . NICM (nonischemic cardiomyopathy) ([place])    Primarily nonischemic. Echo (12/14) with EF 25-30%. Echo (3/15) with EF 25%, mild to moderately  dilated LV, normal RV size and systolic function.   . Pneumonia   . Urethral stricture     Past Surgical History:  Procedure Laterality Date  . ABDOMINAL AORTOGRAM W/LOWER EXTREMITY N/A 03/30/2016   Procedure: Abdominal Aortogram w/Lower Extremity;  Surgeon: Angelia Mould, MD;  Location: [facility];  Service: Cardiovascular;  Laterality: N/A;  . AMPUTATION Right 04/26/2016   Procedure: Right Below Knee Amputation;  Surgeon: Newt Minion, MD;  Location: [facility];  Service: Orthopedics;  Laterality: Right;  . AV FISTULA PLACEMENT Right 09/08/2015   Procedure: INSERTION OF 4-37m x 45cm  ARTERIOVENOUS (AV) GORE-TEX GRAFT RIGHT UPPER  ARM;  Surgeon: CAngelia Mould MD;  Location[place]  Service: Vascular;  Laterality: Right;  . AV FISTULA PLACEMENT Left 01/14/2016   Procedure: CREATION OF LEFT UPPER ARM ARTERIOVENOUS FISTULA;  Surgeon: CAngelia Mould MD;  [place] [facility]  Service: Vascular;  Laterality: Left;  . BASCILIC VEIN TRANSPOSITION Right 08/22/2014   Procedure: RIGHT UPPER ARM BASCILIC VEIN TRANSPOSITION;  Surgeon: CAngelia Mould MD;  [place] [facility]  Service: Vascular;  Laterality: Right;  . BELOW KNEE LEG AMPUTATION Right 04/26/2016  . CARDIAC CATHETERIZATION    . CARDIAC DEFIBRILLATOR PLACEMENT  06/27/2013   Sub Q       BY DR KCaryl Comes . COLONOSCOPY WITH PROPOFOL N/A 07/22/2015   Procedure: COLONOSCOPY  WITH PROPOFOL;  Surgeon: Doran Stabler, MD;  Location: [facility];  Service: Gastroenterology;  Laterality: N/A;  . FEMORAL-POPLITEAL BYPASS GRAFT Right 03/31/2016   Procedure: BYPASS GRAFT FEMORAL-POPLITEAL ARTERY USING RIGHT GREATER SAPHENOUS NONREVERSED VEIN;  Surgeon: Angelia Mould, MD;  Location: [facility];  Service: Vascular;  Laterality: Right;  . HERNIA REPAIR    . I&D EXTREMITY Right 03/31/2016   Procedure: IRRIGATION AND DEBRIDEMENT FOOT;  Surgeon: Angelia Mould, MD;  Location: [facility];  Service: Vascular;  Laterality: Right;  .  IMPLANTABLE CARDIOVERTER DEFIBRILLATOR IMPLANT N/A 06/27/2013   Procedure: SUB Q ICD;  Surgeon: Deboraha Sprang, MD;  Location: [facility] CATH LAB;  Service: Cardiovascular;  Laterality: N/A;  . INTRAOPERATIVE ARTERIOGRAM Right 03/31/2016   Procedure: INTRA OPERATIVE ARTERIOGRAM;  Surgeon: Angelia Mould, MD;  Location: [place];  Service: Vascular;  Laterality: Right;  . IR GENERIC HISTORICAL Right 11/30/2015   IR THROMBECTOMY AV FISTULA W/THROMBOLYSIS/PTA INC/SHUNT/IMG RIGHT 11/30/2015 Aletta Edouard, MD [facility]  . IR GENERIC HISTORICAL  11/30/2015   IR US GUIDE VASC ACCESS RIGHT 11/30/2015 Aletta Edouard, MD [facility]  . IR GENERIC HISTORICAL Right 12/15/2015   IR THROMBECTOMY AV FISTULA W/THROMBOLYSIS/PTA/STENT INC/SHUNT/IMG RT 12/15/2015 Arne Cleveland, MD [facility]  . IR GENERIC HISTORICAL  12/15/2015   IR US GUIDE VASC ACCESS RIGHT 12/15/2015 Arne Cleveland, MD [facility]  . IR GENERIC HISTORICAL  12/28/2015   IR FLUORO GUIDE CV LINE RIGHT 12/28/2015 Marybelle Killings, MD [facility]  . IR GENERIC HISTORICAL  12/28/2015   IR US GUIDE VASC ACCESS RIGHT 12/28/2015 Marybelle Killings, MD [facility]  . LEFT A ND RIGHT HEART CATH  01/30/2013   DR Sung Amabile  . LEFT AND RIGHT HEART CATHETERIZATION WITH CORONARY ANGIOGRAM N/A 01/30/2013   Procedure: LEFT AND RIGHT HEART CATHETERIZATION WITH CORONARY ANGIOGRAM;  Surgeon: Jolaine Artist, MD;  Location: [facility] CATH LAB;  Service: Cardiovascular;  Laterality: N/A;  . PERIPHERAL VASCULAR CATHETERIZATION Right 01/26/2015   Procedure: A/V Fistulagram;  Surgeon: Angelia Mould, MD;  Location: [place] [facility];  Service: Cardiovascular;  Laterality: Right;  . reapea urethral surgery for recurrent obstruction  2011  . TOTAL KNEE ARTHROPLASTY Right 2007  . VEIN HARVEST Right 03/31/2016   Procedure: RIGHT GREATER SAPHENOUS VEIN HARVEST;  Surgeon: Angelia Mould, MD;  Location: [facility];  Service: Vascular;  Laterality: Right;    There were no  vitals filed for this visit.      Subjective Assessment - 10/07/16 0913    Subjective Pt reports no changes since last session - thinks he will be ready for discharge in 2-3 more weeks   Patient Stated Goals Walk without assistance or an AD in the community   Currently in Pain? No/denies                         [facility] Adult PT Treatment/Exercise - 10/07/16 0001      Transfers   Transfers Sit to Stand;Stand to Sit   Sit to Stand 6: Modified independent (Device/Increase time);From chair/3-in-1   Stand to Sit 6: Modified independent (Device/Increase time);To chair/3-in-1     Ambulation/Gait   Ambulation/Gait Yes   Ambulation/Gait Assistance 4: Min guard   Ambulation Distance (Feet) 150 Feet   Assistive device None   Gait Pattern Step-through pattern   Ambulation Surface Level;Indoor   Stairs Yes   Stair Management Technique Two rails;Alternating pattern   Number of Stairs 4  3  reps   Height of Stairs 6   Ramp 5: Supervision   Ramp Details (indicate cue type and reason) with min CGA with no device   Curb Other (comment)  min hand held assist   Curb Details (indicate cue type and reason) with min hand held assist     High Level Balance   High Level Balance Activities Other (comment);Negotitating around obstacles  alterante tap ups to 6" step 5 reps each LE      Pt gait trained outside on grass with cane approx. 150' and on pavement approx. 80' with CGA without LOB (from back door Of clinic to front door fo building)   Pt also negotiated ramp and curb with cane with supervision        Balance Exercises - 10/07/16 0932      Balance Exercises: Standing   Standing Eyes Opened Wide ([facility]);Head turns;Foam/compliant surface;5 reps   Standing Eyes Closed Wide ([facility]);Head turns;Foam/compliant surface;5 reps   Rockerboard Anterior/posterior;Lateral;EO;10 reps     Pt performed figure 8's around cones on floor to improve balance with turning - with use of cane  with CGA Amb. Forward and backward on blue mat with cane with CGA to min assist        PT Short Term Goals - 09/20/16 1539      PT SHORT TERM GOAL #1   Title Patient tolerates prosthesis wear >10hrs total /day with no skin issues & limb pain </= 6/10    Baseline 09/20/16: met today   Status Achieved     PT SHORT TERM GOAL #2   Title Patient verbalizes proper cleaning, weighing & wearing at dialysis.    Baseline 09/20/16: met today   Status Achieved     PT SHORT TERM GOAL #3   Title Patient ambulates 300' with RW or crutches & prosthesis with supervision.    Baseline 09/20/16: 500 feet with cane/prosthesis on indoor/outdoor surfaces with supervision   Time --   Period --   Status Achieved     PT SHORT TERM GOAL #4   Title Patient negotiates ramps, curbs with RW or crutches & stairs with 1 rail with supervision.    Baseline 09/20/16: met today with cane/prosthesis for ramp/curb and rail/cane for stairs   Time --   Period --   Status Achieved     PT SHORT TERM GOAL #5   Title Patient picks up objects from floor without UE support safely.    Baseline 09/20/16: met today    Time --   Period --   Status Achieved           PT Long Term Goals - 10/05/16 1057      PT LONG TERM GOAL #1   Title Patient verbalizes proper prosthetic care including wear/use at dialysis to enable safe use of prosthesis. (updated target date for all LTGs is 10/21/16)   Time 12   Period Weeks   Status On-going   Target Date 10/21/16     PT LONG TERM GOAL #2   Title Patient tolerates wear of prosthesis >90% of awake hours with no skin issues & limb pain </= 2/10   Time 12   Period Weeks   Status On-going   Target Date 10/21/16     PT LONG TERM GOAL #3   Title Patient ambulates 47' around household furniture carrying plate & cup with prosthesis only modified independent.    Time 12   Period Weeks   Status On-going   Target  Date 10/21/16     PT LONG TERM GOAL #4   Title Berg Balance >45/56  to indicate lower fall risk.    Time 12   Period Weeks   Status On-going   Target Date 10/21/16     PT LONG TERM GOAL #5   Title Patient ambulates 500' outdoors including grass with cane & prosthesis modified independent to enable community mobility.    Time 12   Period Weeks   Status On-going   Target Date 10/21/16     PT LONG TERM GOAL #6   Title Patient negotiates ramps, curbs & stairs (1 rail) with cane & prosthesis modified independent for community access.    Time 12   Period Weeks   Status On-going   Target Date 10/21/16     PT LONG TERM GOAL #7   Title Patient able to lift/ carry 15# box, push /pull items & negotiate around/over ostacles with prosthesis modified independent for household tasks.    Time 12   Period Weeks   Status On-going   Target Date 10/21/16               Plan - 10/07/16 0920    Clinical Impression Statement Pt progressing well towards goals; pt gait trained inside clinic without use of cane with no LOB and outside on grass and pavement with SPC without LOB; pt reports he feels he is doing well and anticipates D/C in a few weeks   PT Frequency 2x / week   PT Duration 12 weeks   PT Treatment/Interventions ADLs/Self Care Home Management;DME Instruction;Gait training;Stair training;Functional mobility training;Therapeutic activities;Therapeutic exercise;Balance training;Neuromuscular re-education;Patient/family education;Prosthetic Training   PT Next Visit Plan work simulation tasks, continue to work on gait with cane, including barriers with cane and outdoor surfaces as weather allows, continue to work on balance reactions as well   Consulted and Agree with Plan of Care Patient      Patient will benefit from skilled therapeutic intervention in order to improve the following deficits and impairments:  Abnormal gait, Decreased activity tolerance, Decreased balance, Decreased knowledge of use of DME, Decreased mobility, Postural dysfunction,  Prosthetic Dependency  Visit Diagnosis: Other abnormalities of gait and mobility  Unsteadiness on feet     Problem List Patient Active Problem List   Diagnosis Date Noted  . Onychomycosis 08/16/2016  . Status post unilateral below knee amputation, right ([place]) 04/26/2016  . Cutaneous abscess of right foot 04/19/2016  . Diabetic foot infection ([place])   . Diabetic foot ulcer ([place]) 03/30/2016  . Bilateral carotid bruits 03/30/2016  . Diabetes mellitus with complication ([place])   . Anemia due to chronic kidney disease   . ESRD (end stage renal disease) on dialysis ([place]) 11/30/2015  . Acute respiratory failure with hypoxia ([place]) 11/29/2015  . At high risk for falls 09/29/2014  . Peripheral vascular disease ([place]) 09/29/2014  . Osteoarthritis of both knees 01/07/2014  . Osteoarthritis of multiple joints 10/06/2013  . NICM (nonischemic cardiomyopathy) ([place]) 06/27/2013  . CAD (coronary artery disease) 03/13/2013  . Abnormal nuclear stress test 01/27/2013  . Chronic systolic congestive heart failure ([place]) 01/27/2013  . COPD (chronic obstructive pulmonary disease) ([place]) 12/27/2012  . Diabetic polyneuropathy associated with type 2 diabetes mellitus ([place]) 02/10/2012  . PYELITIS/PYELONEPHRITIS DISEASES CLASSIFIED ELSW 09/28/2009  . URINARY CALCULUS 09/28/2009  . Adjustment disorder with depressed mood 08/21/2009  . Poorly controlled type II diabetes mellitus with renal complication ([place]) 42/70/6237  . Hyperlipidemia 04/14/2008  . Essential hypertension 04/14/2008  .  ALLERGIC RHINITIS 04/14/2008    Alda Lea, PT 10/07/2016, 9:33 AM  [facility] [address] [place], [place], [address] Phone: [phone]   Fax:  [phone]  Name: VIRGLE ARTH MRN: [id number] Date of Birth: 1960/07/04

## 2016-10-11 ENCOUNTER — Ambulatory Visit: Payer: BLUE CROSS/BLUE SHIELD | Attending: Family | Admitting: Physical Therapy

## 2016-10-11 ENCOUNTER — Encounter: Payer: Self-pay | Admitting: Physical Therapy

## 2016-10-11 DIAGNOSIS — R2689 Other abnormalities of gait and mobility: Secondary | ICD-10-CM | POA: Insufficient documentation

## 2016-10-11 DIAGNOSIS — R296 Repeated falls: Secondary | ICD-10-CM

## 2016-10-11 DIAGNOSIS — R2681 Unsteadiness on feet: Secondary | ICD-10-CM

## 2016-10-11 DIAGNOSIS — M6281 Muscle weakness (generalized): Secondary | ICD-10-CM | POA: Diagnosis present

## 2016-10-11 NOTE — Therapy (Signed)
[facility] [address] [place], [place], [address] Phone: [phone]   Fax:  [phone]  Physical Therapy Treatment  Patient Details  Name: Anthony Bullock MRN: [id number] Date of Birth: 1960/11/05 Referring Provider: Meridee Score, MD  Encounter Date: 10/11/2016      PT End of Session - 10/11/16 1453    Visit Number 13   Number of Visits 25   Date for PT Re-Evaluation 11/18/16   Authorization Type BC[employer]   Authorization Time Period $650 deductible met, 20% coinsurance, 60 visit comb limit, 0 authorization,    PT Start Time 1450   PT Stop Time 1530   PT Time Calculation (min) 40 min   Equipment Utilized During Treatment Gait belt   Activity Tolerance Patient tolerated treatment well   Behavior During Therapy WFL for tasks assessed/performed      Past Medical History:  Diagnosis Date  . 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 Allergic rhinitis   . Anemia   . Arthritis   . Chronic systolic heart failure ([place])    a. ECHO (12/2012) EF 25-30%, HK entireanteroseptal myocardium //  b.  EF 25%, diffuse HK, grade 1 diastolic dysfunction, MAC, mild LAE, normal RVSF, trivial pericardial effusion  . COPD (chronic obstructive pulmonary disease) ([place])   . Diabetes mellitus type II   . Diabetic nephropathy ([place])   . Diabetic neuropathy ([place])   . ESRD on hemodialysis [facility])    started HD June 2017, goes to [facility] HD unit, Dr Hinda Lenis  . History of cardiac catheterization    a.Myoview 1/15:  There is significant left ventricular dysfunction. There may be slight scar at the apex. There is no significant ischemia. LV Ejection Fraction: 27%  //  b. RHC/LHC (1/15) with mean RA 6, PA 47/22 mean 33, mean PCWP 20, PVR 2.5 WU, CI 2.5; 80% dLAD stenosis, 70% diffuse large D.   . History of kidney stones   . Hyperlipidemia   . Hypertension   . Kidney stones   . NICM (nonischemic cardiomyopathy)  ([place])    Primarily nonischemic. Echo (12/14) with EF 25-30%. Echo (3/15) with EF 25%, mild to moderately dilated LV, normal RV size and systolic function.   . Pneumonia   . Urethral stricture     Past Surgical History:  Procedure Laterality Date  . ABDOMINAL AORTOGRAM W/LOWER EXTREMITY N/A 03/30/2016   Procedure: Abdominal Aortogram w/Lower Extremity;  Surgeon: Angelia Mould, MD;  Location: [facility];  Service: Cardiovascular;  Laterality: N/A;  . AMPUTATION Right 04/26/2016   Procedure: Right Below Knee Amputation;  Surgeon: Newt Minion, MD;  Location: [facility];  Service: Orthopedics;  Laterality: Right;  . AV FISTULA PLACEMENT Right 09/08/2015   Procedure: INSERTION OF 4-49m x 45cm  ARTERIOVENOUS (AV) GORE-TEX GRAFT RIGHT UPPER  ARM;  Surgeon: CAngelia Mould MD;  [facility]  Service: Vascular;  Laterality: Right;  . AV FISTULA PLACEMENT Left 01/14/2016   Procedure: CREATION OF LEFT UPPER ARM ARTERIOVENOUS FISTULA;  Surgeon: CAngelia Mould MD;  [facility] [facility]  Service: Vascular;  Laterality: Left;  . BASCILIC VEIN TRANSPOSITION Right 08/22/2014   Procedure: RIGHT UPPER ARM BASCILIC VEIN TRANSPOSITION;  Surgeon: CAngelia Mould MD;  [facility]  Service: Vascular;  Laterality: Right;  . BELOW KNEE LEG AMPUTATION Right 04/26/2016  . CARDIAC CATHETERIZATION    . CARDIAC DEFIBRILLATOR PLACEMENT  06/27/2013   Sub Q  BY DR Caryl Comes  . COLONOSCOPY WITH PROPOFOL N/A 07/22/2015   Procedure: COLONOSCOPY WITH PROPOFOL;  Surgeon: Doran Stabler, MD;  Location: [facility];  Service: Gastroenterology;  Laterality: N/A;  . FEMORAL-POPLITEAL BYPASS GRAFT Right 03/31/2016   Procedure: BYPASS GRAFT FEMORAL-POPLITEAL ARTERY USING RIGHT GREATER SAPHENOUS NONREVERSED VEIN;  Surgeon: Angelia Mould, MD;  Location: [facility];  Service: Vascular;  Laterality: Right;  . HERNIA REPAIR    . I&D EXTREMITY Right 03/31/2016   Procedure: IRRIGATION AND DEBRIDEMENT  FOOT;  Surgeon: Angelia Mould, MD;  Location: [facility];  Service: Vascular;  Laterality: Right;  . IMPLANTABLE CARDIOVERTER DEFIBRILLATOR IMPLANT N/A 06/27/2013   Procedure: SUB Q ICD;  Surgeon: Deboraha Sprang, MD;  [facility];  Service: Cardiovascular;  Laterality: N/A;  . INTRAOPERATIVE ARTERIOGRAM Right 03/31/2016   Procedure: INTRA OPERATIVE ARTERIOGRAM;  Surgeon: Angelia Mould, MD;  Location: [facility];  Service: Vascular;  Laterality: Right;  . IR GENERIC HISTORICAL Right 11/30/2015   IR THROMBECTOMY AV FISTULA W/THROMBOLYSIS/PTA INC/SHUNT/IMG RIGHT 11/30/2015 Aletta Edouard, MD [facility]  . IR GENERIC HISTORICAL  11/30/2015   IR US GUIDE VASC ACCESS RIGHT 11/30/2015 Aletta Edouard, MD [facility]  . IR GENERIC HISTORICAL Right 12/15/2015   IR THROMBECTOMY AV FISTULA W/THROMBOLYSIS/PTA/STENT INC/SHUNT/IMG RT 12/15/2015 Arne Cleveland, MD [facility]  . IR GENERIC HISTORICAL  12/15/2015   IR US GUIDE VASC ACCESS RIGHT 12/15/2015 Arne Cleveland, MD [facility]  . IR GENERIC HISTORICAL  12/28/2015   IR FLUORO GUIDE CV LINE RIGHT 12/28/2015 Marybelle Killings, MD [facility]  . IR GENERIC HISTORICAL  12/28/2015   IR US GUIDE VASC ACCESS RIGHT 12/28/2015 Marybelle Killings, MD [facility]  . LEFT A ND RIGHT HEART CATH  01/30/2013   DR Sung Amabile  . LEFT AND RIGHT HEART CATHETERIZATION WITH CORONARY ANGIOGRAM N/A 01/30/2013   Procedure: LEFT AND RIGHT HEART CATHETERIZATION WITH CORONARY ANGIOGRAM;  Surgeon: Jolaine Artist, MD;  Location: [facility] CATH LAB;  Service: Cardiovascular;  Laterality: N/A;  . PERIPHERAL VASCULAR CATHETERIZATION Right 01/26/2015   Procedure: A/V Fistulagram;  Surgeon: Angelia Mould, MD;  Location: [facility];  Service: Cardiovascular;  Laterality: Right;  . reapea urethral surgery for recurrent obstruction  2011  . TOTAL KNEE ARTHROPLASTY Right 2007  . VEIN HARVEST Right 03/31/2016   Procedure: RIGHT GREATER SAPHENOUS VEIN HARVEST;  Surgeon:  Angelia Mould, MD;  Location: [facility];  Service: Vascular;  Laterality: Right;    There were no vitals filed for this visit.      Subjective Assessment - 10/11/16 1451    Subjective No new complaints. No falls or pain to report, just some soreness at tibial crest area.    Pertinent History Rt TTA, ESRD, DM, HTN, peripheral neuropathy, cardiomyopathy, CHF, COPD, AVF sg, R total knee replacement 2007   Limitations Lifting;Standing;Walking;House hold activities   Patient Stated Goals Walk without assistance or an AD in the community   Currently in Pain? Yes   Pain Score 2    Pain Location Leg   Pain Orientation Right   Pain Descriptors / Indicators Sore   Pain Type Acute pain   Pain Onset More than a month ago   Pain Frequency Intermittent   Aggravating Factors  increased pressure from prosthesis   Pain Relieving Factors rest, sitting down, sock ply adjustment           [facility] Adult PT Treatment/Exercise - 10/11/16 1454      Transfers  Transfers Sit to Stand;Stand to Sit   Sit to Stand 6: Modified independent (Device/Increase time);From chair/3-in-1   Stand to Sit 6: Modified independent (Device/Increase time);To chair/3-in-1   Floor to Transfer 5: Supervision;With upper extremity assist   Floor to Transfer Details (indicate cue type and reason) demo'd technique for getting down to floor on red mat and back up with UE assist. Pt able to return demo x 2 reps with supervision. Demo'd techniques for getting up without UE support with pt declining to attempt due to fatigue.                 Ambulation/Gait   Ambulation/Gait Yes   Ambulation/Gait Assistance 5: Supervision   Ambulation/Gait Assistance Details weaving around furniture with no AD   Ambulation Distance (Feet) 720 Feet  x1 in/outdoors, 120 no AD, plus around gym with/wo AD   Assistive device Straight cane   Gait Pattern Step-through pattern;Antalgic;Decreased stance time - right;Decreased step length - left    Ambulation Surface Level;Unlevel;Indoor;Outdoor;Paved;Grass;Other (comment)  rubber mulch   Stairs Yes   Stairs Assistance 5: Supervision;4: Min guard   Stairs Assistance Details (indicate cue type and reason) 1st rep with rail/cane on one side and second rail on other side while carring 10# weight to simulate groceries. pt demo's reciprocal pattern up and step to pattern down. cues on how to secure cane strap on wrist and hold cane/rail at same time. 2cd rep: used to rails to work on reciprocal pattern both ways. mod cues needed for decending reciprocally with minimal right knee flexion noted making it not safe. Pt reports he has had issues with bending that knee since he had it replaced before the amputation.    Stair Management Technique One rail Right;One rail Left;Alternating pattern;Step to pattern;Two rails;With cane   Number of Stairs 4  x 2 reps   Height of Stairs 6     Therapeutic Activites    Work Simulation Demo'd proper pushing/pulling technique wiht posture and prosthetic placement. Pt was able to return demo with minimal cues needed.     Prosthetics   Current prosthetic wear tolerance (days/week)  daily   Current prosthetic wear tolerance (#hours/day)  all awake hours drying as needed   Residual limb condition  intact per pt   Donning Prosthesis Modified independent (device/increased time)   Doffing Prosthesis Modified independent (device/increased time)           PT Short Term Goals - 09/20/16 1539      PT SHORT TERM GOAL #1   Title Patient tolerates prosthesis wear >10hrs total /day with no skin issues & limb pain </= 6/10    Baseline 09/20/16: met today   Status Achieved     PT SHORT TERM GOAL #2   Title Patient verbalizes proper cleaning, weighing & wearing at dialysis.    Baseline 09/20/16: met today   Status Achieved     PT SHORT TERM GOAL #3   Title Patient ambulates 300' with RW or crutches & prosthesis with supervision.    Baseline 09/20/16: 500 feet with  cane/prosthesis on indoor/outdoor surfaces with supervision   Time --   Period --   Status Achieved     PT SHORT TERM GOAL #4   Title Patient negotiates ramps, curbs with RW or crutches & stairs with 1 rail with supervision.    Baseline 09/20/16: met today with cane/prosthesis for ramp/curb and rail/cane for stairs   Time --   Period --   Status Achieved  PT SHORT TERM GOAL #5   Title Patient picks up objects from floor without UE support safely.    Baseline 09/20/16: met today    Time --   Period --   Status Achieved           PT Long Term Goals - 10/11/16 1537      PT LONG TERM GOAL #1   Title Patient verbalizes proper prosthetic care including wear/use at dialysis to enable safe use of prosthesis. (updated target date for all LTGs is 10/21/16)   Baseline 10/11/16: met today   Status Achieved     PT LONG TERM GOAL #2   Title Patient tolerates wear of prosthesis >90% of awake hours with no skin issues & limb pain </= 2/10   Baseline 10/11/16: met today   Status Achieved     PT LONG TERM GOAL #3   Title Patient ambulates 38' around household furniture carrying plate & cup with prosthesis only modified independent.    Time 12   Period Weeks   Status On-going     PT LONG TERM GOAL #4   Title Berg Balance >45/56 to indicate lower fall risk.    Time 12   Period Weeks   Status On-going     PT LONG TERM GOAL #5   Title Patient ambulates 500' outdoors including grass with cane & prosthesis modified independent to enable community mobility.    Time 12   Period Weeks   Status On-going     PT LONG TERM GOAL #6   Title Patient negotiates ramps, curbs & stairs (1 rail) with cane & prosthesis modified independent for community access.    Time 12   Period Weeks   Status On-going     PT LONG TERM GOAL #7   Title Patient able to lift/ carry 15# box, push /pull items & negotiate around/over ostacles with prosthesis modified independent for household tasks.    Time 12    Period Weeks   Status On-going            Plan - 10/11/16 1453    Clinical Impression Statement Today's skilled session focused on mobility with prosthesis and work simulation tasks. Pt is making great progress toward goals and should be ready to discharge within the next 2 visits.    PT Frequency 2x / week   PT Duration 12 weeks   PT Treatment/Interventions ADLs/Self Care Home Management;DME Instruction;Gait training;Stair training;Functional mobility training;Therapeutic activities;Therapeutic exercise;Balance training;Neuromuscular re-education;Patient/family education;Prosthetic Training   PT Next Visit Plan begin checking goals for anticipated discharge in next 2 visits   Consulted and Agree with Plan of Care Patient      Patient will benefit from skilled therapeutic intervention in order to improve the following deficits and impairments:  Abnormal gait, Decreased activity tolerance, Decreased balance, Decreased knowledge of use of DME, Decreased mobility, Postural dysfunction, Prosthetic Dependency  Visit Diagnosis: Other abnormalities of gait and mobility  Unsteadiness on feet  Muscle weakness (generalized)  Repeated falls     Problem List Patient Active Problem List   Diagnosis Date Noted  . Onychomycosis 08/16/2016  . Status post unilateral below knee amputation, right ([place]) 04/26/2016  . Cutaneous abscess of right foot 04/19/2016  . Diabetic foot infection ([place])   . Diabetic foot ulcer ([place]) 03/30/2016  . Bilateral carotid bruits 03/30/2016  . Diabetes mellitus with complication ([place])   . Anemia due to chronic kidney disease   . ESRD (end stage renal disease) on dialysis ([place])  11/30/2015  . Acute respiratory failure with hypoxia ([place]) 11/29/2015  . At high risk for falls 09/29/2014  . Peripheral vascular disease ([place]) 09/29/2014  . Osteoarthritis of both knees 01/07/2014  . Osteoarthritis of multiple joints 10/06/2013  . NICM (nonischemic cardiomyopathy)  ([place]) 06/27/2013  . CAD (coronary artery disease) 03/13/2013  . Abnormal nuclear stress test 01/27/2013  . Chronic systolic congestive heart failure ([place]) 01/27/2013  . COPD (chronic obstructive pulmonary disease) ([place]) 12/27/2012  . Diabetic polyneuropathy associated with type 2 diabetes mellitus ([place]) 02/10/2012  . PYELITIS/PYELONEPHRITIS DISEASES CLASSIFIED ELSW 09/28/2009  . URINARY CALCULUS 09/28/2009  . Adjustment disorder with depressed mood 08/21/2009  . Poorly controlled type II diabetes mellitus with renal complication ([place]) 14/97/0263  . Hyperlipidemia 04/14/2008  . Essential hypertension 04/14/2008  . ALLERGIC RHINITIS 04/14/2008    Willow Ora, PTA, [facility] [address] [place], [place] [address] [phone] 10/11/16, 3:40 PM   Name: Anthony Bullock MRN: [id number] Date of Birth: 11-Feb-1960

## 2016-10-13 ENCOUNTER — Encounter: Payer: Self-pay | Admitting: Physical Therapy

## 2016-10-13 ENCOUNTER — Ambulatory Visit: Payer: BLUE CROSS/BLUE SHIELD | Admitting: Physical Therapy

## 2016-10-13 DIAGNOSIS — R2681 Unsteadiness on feet: Secondary | ICD-10-CM

## 2016-10-13 DIAGNOSIS — R2689 Other abnormalities of gait and mobility: Secondary | ICD-10-CM | POA: Diagnosis not present

## 2016-10-13 DIAGNOSIS — M6281 Muscle weakness (generalized): Secondary | ICD-10-CM

## 2016-10-13 DIAGNOSIS — R296 Repeated falls: Secondary | ICD-10-CM

## 2016-10-16 NOTE — Therapy (Signed)
[facility] [address] [place], [place], [address] Phone: [phone]   Fax:  [phone]  Physical Therapy Treatment  Patient Details  Name: Anthony Bullock MRN: [id number] Date of Birth: Feb 09, 1960 Referring Provider: Meridee Score, MD  Encounter Date: 10/13/2016   10/13/16 1535  PT Visits / Re-Eval  Visit Number 14  Number of Visits 25  Date for PT Re-Evaluation 11/18/16  Authorization  Authorization Type [id number]  Authorization Time Period $650 deductible met, 20% coinsurance, 60 visit comb limit, 0 authorization,   PT Time Calculation  PT Start Time 1532  PT Stop Time 1612  PT Time Calculation (min) 40 min  PT - End of Session  Equipment Utilized During Treatment Gait belt  Activity Tolerance Patient tolerated treatment well  Behavior During Therapy [facility] for tasks assessed/performed     Past Medical History:  Diagnosis Date  . 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 Allergic rhinitis   . Anemia   . Arthritis   . Chronic systolic heart failure ([place])    a. ECHO (12/2012) EF 25-30%, HK entireanteroseptal myocardium //  b.  EF 25%, diffuse HK, grade 1 diastolic dysfunction, MAC, mild LAE, normal RVSF, trivial pericardial effusion  . COPD (chronic obstructive pulmonary disease) ([place])   . Diabetes mellitus type II   . Diabetic nephropathy ([place])   . Diabetic neuropathy ([place])   . ESRD on hemodialysis [facility])    started HD June 2017, goes to [facility] HD unit, Dr Hinda Lenis  . History of cardiac catheterization    a.Myoview 1/15:  There is significant left ventricular dysfunction. There may be slight scar at the apex. There is no significant ischemia. LV Ejection Fraction: 27%  //  b. RHC/LHC (1/15) with mean RA 6, PA 47/22 mean 33, mean PCWP 20, PVR 2.5 WU, CI 2.5; 80% dLAD stenosis, 70% diffuse large D.   . History of kidney stones   . Hyperlipidemia   . Hypertension   . Kidney  stones   . NICM (nonischemic cardiomyopathy) ([place])    Primarily nonischemic. Echo (12/14) with EF 25-30%. Echo (3/15) with EF 25%, mild to moderately dilated LV, normal RV size and systolic function.   . Pneumonia   . Urethral stricture     Past Surgical History:  Procedure Laterality Date  . ABDOMINAL AORTOGRAM W/LOWER EXTREMITY N/A 03/30/2016   Procedure: Abdominal Aortogram w/Lower Extremity;  Surgeon: Angelia Mould, MD;  Location: [place] CV LAB;  Service: Cardiovascular;  Laterality: N/A;  . AMPUTATION Right 04/26/2016   Procedure: Right Below Knee Amputation;  Surgeon: Newt Minion, MD;  Location: [place];  Service: Orthopedics;  Laterality: Right;  . AV FISTULA PLACEMENT Right 09/08/2015   Procedure: INSERTION OF 4-61m x 45cm  ARTERIOVENOUS (AV) GORE-TEX GRAFT RIGHT UPPER  ARM;  Surgeon: CAngelia Mould MD;  Location: [place]  Service: Vascular;  Laterality: Right;  . AV FISTULA PLACEMENT Left 01/14/2016   Procedure: CREATION OF LEFT UPPER ARM ARTERIOVENOUS FISTULA;  Surgeon: CAngelia Mould MD;  Location: [place]  Service: Vascular;  Laterality: Left;  . BASCILIC VEIN TRANSPOSITION Right 08/22/2014   Procedure: RIGHT UPPER ARM BASCILIC VEIN TRANSPOSITION;  Surgeon: CAngelia Mould MD;  [place]  Service: Vascular;  Laterality: Right;  . BELOW KNEE LEG AMPUTATION Right 04/26/2016  . CARDIAC CATHETERIZATION    . CARDIAC DEFIBRILLATOR PLACEMENT  06/27/2013   Sub Q       BY  DR Caryl Comes  . COLONOSCOPY WITH PROPOFOL N/A 07/22/2015   Procedure: COLONOSCOPY WITH PROPOFOL;  Surgeon: Doran Stabler, MD;  Location: [facility];  Service: Gastroenterology;  Laterality: N/A;  . FEMORAL-POPLITEAL BYPASS GRAFT Right 03/31/2016   Procedure: BYPASS GRAFT FEMORAL-POPLITEAL ARTERY USING RIGHT GREATER SAPHENOUS NONREVERSED VEIN;  Surgeon: Angelia Mould, MD;  Location: [place];  Service: Vascular;  Laterality: Right;  . HERNIA REPAIR    . I&D EXTREMITY Right  03/31/2016   Procedure: IRRIGATION AND DEBRIDEMENT FOOT;  Surgeon: Angelia Mould, MD;  Location: [place];  Service: Vascular;  Laterality: Right;  . IMPLANTABLE CARDIOVERTER DEFIBRILLATOR IMPLANT N/A 06/27/2013   Procedure: SUB Q ICD;  Surgeon: Deboraha Sprang, MD;  Location: [facility] CATH LAB;  Service: Cardiovascular;  Laterality: N/A;  . INTRAOPERATIVE ARTERIOGRAM Right 03/31/2016   Procedure: INTRA OPERATIVE ARTERIOGRAM;  Surgeon: Angelia Mould, MD;  Location: [place];  Service: Vascular;  Laterality: Right;  . IR GENERIC HISTORICAL Right 11/30/2015   IR THROMBECTOMY AV FISTULA W/THROMBOLYSIS/PTA INC/SHUNT/IMG RIGHT 11/30/2015 Aletta Edouard, MD [facility]  . IR GENERIC HISTORICAL  11/30/2015   IR US GUIDE VASC ACCESS RIGHT 11/30/2015 Aletta Edouard, MD [facility]  . IR GENERIC HISTORICAL Right 12/15/2015   IR THROMBECTOMY AV FISTULA W/THROMBOLYSIS/PTA/STENT INC/SHUNT/IMG RT 12/15/2015 Arne Cleveland, MD [facility]  . IR GENERIC HISTORICAL  12/15/2015   IR US GUIDE VASC ACCESS RIGHT 12/15/2015 Arne Cleveland, MD [facility]  . IR GENERIC HISTORICAL  12/28/2015   IR FLUORO GUIDE CV LINE RIGHT 12/28/2015 Marybelle Killings, MD [facility]  . IR GENERIC HISTORICAL  12/28/2015   IR US GUIDE VASC ACCESS RIGHT 12/28/2015 Marybelle Killings, MD [facility]  . LEFT A ND RIGHT HEART CATH  01/30/2013   DR Sung Amabile  . LEFT AND RIGHT HEART CATHETERIZATION WITH CORONARY ANGIOGRAM N/A 01/30/2013   Procedure: LEFT AND RIGHT HEART CATHETERIZATION WITH CORONARY ANGIOGRAM;  Surgeon: Jolaine Artist, MD;  Location: [facility] CATH LAB;  Service: Cardiovascular;  Laterality: N/A;  . PERIPHERAL VASCULAR CATHETERIZATION Right 01/26/2015   Procedure: A/V Fistulagram;  Surgeon: Angelia Mould, MD;  Location: [place] CV LAB;  Service: Cardiovascular;  Laterality: Right;  . reapea urethral surgery for recurrent obstruction  2011  . TOTAL KNEE ARTHROPLASTY Right 2007  . VEIN HARVEST Right 03/31/2016    Procedure: RIGHT GREATER SAPHENOUS VEIN HARVEST;  Surgeon: Angelia Mould, MD;  Location: [place];  Service: Vascular;  Laterality: Right;    There were no vitals filed for this visit.   10/13/16 1534  Symptoms/Limitations  Subjective No new complaints or changes.  Pertinent History Rt TTA, ESRD, DM, HTN, peripheral neuropathy, cardiomyopathy, CHF, COPD, AVF sg, R total knee replacement 2007  Limitations Lifting;Standing;Walking;House hold activities  Patient Stated Goals Walk without assistance or an AD in the community  Pain Assessment  Currently in Pain? Yes  Pain Score 2  Pain Location Leg  Pain Orientation Right  Pain Descriptors / Indicators Sore  Pain Type Acute pain  Pain Onset More than a month ago  Pain Frequency Intermittent  Aggravating Factors  increased pressure from prosthesis  Pain Relieving Factors rest, sitting down, sock ply adjustment       10/13/16 1536  Transfers  Transfers Sit to Stand;Stand to Sit  Sit to Stand 6: Modified independent (Device/Increase time);From chair/3-in-1  Stand to Sit 6: Modified independent (Device/Increase time);To chair/3-in-1  Ambulation/Gait  Ambulation/Gait Yes  Ambulation/Gait Assistance 6: Modified independent (Device/Increase time)  Ambulation/Gait Assistance Details  no imbalance or toe scuffing   Ambulation Distance (Feet) 600 Feet  Assistive device Straight cane  Gait Pattern WFL  Ambulation Surface Level;Unlevel;Indoor;Outdoor;Paved;Gravel;Grass  Gait velocity 10.91 sec's= 3.0 ft/sec with cane/prosthesis  Stairs Yes  Stairs Assistance 6: Modified independent (Device/Increase time)  Stairs Assistance Details (indicate cue type and reason) 1st: 2 rails with reciprocal pattern, 2cd: 1 rail/cane combo with reciprocal pattern, incr time with descent due to decr available right knee flexion  Stair Management Technique Two rails;One rail Right;Alternating pattern;Forwards;With cane  Number of Stairs 4 (x 2 reps)   Height of Stairs 6  Ramp 6: Modified independent (Device)  Ramp Details (indicate cue type and reason) with cane/prosthesis  Curb 6: Modified independent (Device/increase time)  Curb Details (indicate cue type and reason) 6 inch curb with cane/prosthesis          PT Short Term Goals - 09/20/16 1539      PT SHORT TERM GOAL #1   Title Patient tolerates prosthesis wear >10hrs total /day with no skin issues & limb pain </= 6/10    Baseline 09/20/16: met today   Status Achieved     PT SHORT TERM GOAL #2   Title Patient verbalizes proper cleaning, weighing & wearing at dialysis.    Baseline 09/20/16: met today   Status Achieved     PT SHORT TERM GOAL #3   Title Patient ambulates 300' with RW or crutches & prosthesis with supervision.    Baseline 09/20/16: 500 feet with cane/prosthesis on indoor/outdoor surfaces with supervision   Time --   Period --   Status Achieved     PT SHORT TERM GOAL #4   Title Patient negotiates ramps, curbs with RW or crutches & stairs with 1 rail with supervision.    Baseline 09/20/16: met today with cane/prosthesis for ramp/curb and rail/cane for stairs   Time --   Period --   Status Achieved     PT SHORT TERM GOAL #5   Title Patient picks up objects from floor without UE support safely.    Baseline 09/20/16: met today    Time --   Period --   Status Achieved           PT Long Term Goals - 10/13/16 1536      PT LONG TERM GOAL #1   Title Patient verbalizes proper prosthetic care including wear/use at dialysis to enable safe use of prosthesis. (updated target date for all LTGs is 10/21/16)   Baseline 10/11/16: met today   Status Achieved     PT LONG TERM GOAL #2   Title Patient tolerates wear of prosthesis >90% of awake hours with no skin issues & limb pain </= 2/10   Baseline 10/11/16: met today   Status Achieved     PT LONG TERM GOAL #3   Title Patient ambulates 65' around household furniture carrying plate & cup with prosthesis only  modified independent.    Baseline 10/13/16: met today   Time --   Period --   Status Achieved     PT LONG TERM GOAL #4   Title Berg Balance >45/56 to indicate lower fall risk.    Baseline 10/13/16:    Time --   Period --   Status Achieved     PT LONG TERM GOAL #5   Title Patient ambulates 500' outdoors including grass with cane & prosthesis modified independent to enable community mobility.    Baseline 10/13/16: met today   Time --  Period --   Status Achieved     PT LONG TERM GOAL #6   Title Patient negotiates ramps, curbs & stairs (1 rail) with cane & prosthesis modified independent for community access.    Baseline 10/13/16: met today   Time --   Period --   Status Achieved     PT LONG TERM GOAL #7   Title Patient able to lift/ carry 15# box, push /pull items & negotiate around/over ostacles with prosthesis modified independent for household tasks.    Time 12   Period Weeks   Status On-going        10/13/16 1535  Plan  Clinical Impression Statement Pt has met all LTGs checked to date with one goal remaining to be checked at next session. Assess remaining goal for anticipated discharge at that date.   Pt will benefit from skilled therapeutic intervention in order to improve on the following deficits Abnormal gait;Decreased activity tolerance;Decreased balance;Decreased knowledge of use of DME;Decreased mobility;Postural dysfunction;Prosthetic Dependency  PT Frequency 2x / week  PT Duration 12 weeks  PT Treatment/Interventions ADLs/Self Care Home Management;DME Instruction;Gait training;Stair training;Functional mobility training;Therapeutic activities;Therapeutic exercise;Balance training;Neuromuscular re-education;Patient/family education;Prosthetic Training  PT Next Visit Plan assess remaining LTG/s for anticipated discharge  Consulted and Agree with Plan of Care Patient     Patient will benefit from skilled therapeutic intervention in order to improve the following  deficits and impairments:  Abnormal gait, Decreased activity tolerance, Decreased balance, Decreased knowledge of use of DME, Decreased mobility, Postural dysfunction, Prosthetic Dependency  Visit Diagnosis: Other abnormalities of gait and mobility  Unsteadiness on feet  Muscle weakness (generalized)  Repeated falls     Problem List Patient Active Problem List   Diagnosis Date Noted  . Onychomycosis 08/16/2016  . Status post unilateral below knee amputation, right ([place]) 04/26/2016  . Cutaneous abscess of right foot 04/19/2016  . Diabetic foot infection ([place])   . Diabetic foot ulcer ([place]) 03/30/2016  . Bilateral carotid bruits 03/30/2016  . Diabetes mellitus with complication ([place])   . Anemia due to chronic kidney disease   . ESRD (end stage renal disease) on dialysis ([place]) 11/30/2015  . Acute respiratory failure with hypoxia ([place]) 11/29/2015  . At high risk for falls 09/29/2014  . Peripheral vascular disease ([place]) 09/29/2014  . Osteoarthritis of both knees 01/07/2014  . Osteoarthritis of multiple joints 10/06/2013  . NICM (nonischemic cardiomyopathy) ([place]) 06/27/2013  . CAD (coronary artery disease) 03/13/2013  . Abnormal nuclear stress test 01/27/2013  . Chronic systolic congestive heart failure ([employer]) 01/27/2013  . COPD (chronic obstructive pulmonary disease) ([place]) 12/27/2012  . Diabetic polyneuropathy associated with type 2 diabetes mellitus ([place]) 02/10/2012  . PYELITIS/PYELONEPHRITIS DISEASES CLASSIFIED ELSW 09/28/2009  . URINARY CALCULUS 09/28/2009  . Adjustment disorder with depressed mood 08/21/2009  . Poorly controlled type II diabetes mellitus with renal complication ([place]) 84/66/5993  . Hyperlipidemia 04/14/2008  . Essential hypertension 04/14/2008  . ALLERGIC RHINITIS 04/14/2008    Willow Ora, PTA, [place] [address], [place], [place] [address] [phone] 10/16/16, 2:27 PM   Name: Anthony Bullock MRN: [id number] Date  of Birth: May 26, 1960

## 2016-10-18 ENCOUNTER — Encounter: Payer: Self-pay | Admitting: Physical Therapy

## 2016-10-18 ENCOUNTER — Ambulatory Visit: Payer: BLUE CROSS/BLUE SHIELD | Admitting: Physical Therapy

## 2016-10-18 DIAGNOSIS — R2689 Other abnormalities of gait and mobility: Secondary | ICD-10-CM

## 2016-10-18 DIAGNOSIS — R2681 Unsteadiness on feet: Secondary | ICD-10-CM

## 2016-10-18 DIAGNOSIS — M6281 Muscle weakness (generalized): Secondary | ICD-10-CM

## 2016-10-18 DIAGNOSIS — R296 Repeated falls: Secondary | ICD-10-CM

## 2016-10-19 NOTE — Therapy (Signed)
[facility] [address] [place], [place], [address] Phone: [phone]   Fax:  [phone]  Physical Therapy Treatment  Patient Details  Name: Anthony Bullock MRN: [id number] Date of Birth: 06/07/1960 Referring Provider: Meridee Score, MD  Encounter Date: 10/18/2016      PT End of Session - 10/18/16 2036    Visit Number 15   Number of Visits 25   Date for PT Re-Evaluation 11/18/16   Authorization Type BCBS   Authorization Time Period $650 deductible met, 20% coinsurance, 60 visit comb limit, 0 authorization,    PT Start Time 1530   PT Stop Time 1545   PT Time Calculation (min) 15 min   Equipment Utilized During Treatment Gait belt   Activity Tolerance Patient tolerated treatment well   Behavior During Therapy WFL for tasks assessed/performed      Past Medical History:  Diagnosis Date  . 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 Allergic rhinitis   . Anemia   . Arthritis   . Chronic systolic heart failure ([place])    a. ECHO (12/2012) EF 25-30%, HK entireanteroseptal myocardium //  b.  EF 25%, diffuse HK, grade 1 diastolic dysfunction, MAC, mild LAE, normal RVSF, trivial pericardial effusion  . COPD (chronic obstructive pulmonary disease) ([place])   . Diabetes mellitus type II   . Diabetic nephropathy ([place])   . Diabetic neuropathy ([place])   . ESRD on hemodialysis [facility])    started HD June 2017, goes to [facility] HD unit, Dr Hinda Lenis  . History of cardiac catheterization    a.Myoview 1/15:  There is significant left ventricular dysfunction. There may be slight scar at the apex. There is no significant ischemia. LV Ejection Fraction: 27%  //  b. RHC/LHC (1/15) with mean RA 6, PA 47/22 mean 33, mean PCWP 20, PVR 2.5 WU, CI 2.5; 80% dLAD stenosis, 70% diffuse large D.   . History of kidney stones   . Hyperlipidemia   . Hypertension   . Kidney stones   . NICM (nonischemic cardiomyopathy)  ([place])    Primarily nonischemic. Echo (12/14) with EF 25-30%. Echo (3/15) with EF 25%, mild to moderately dilated LV, normal RV size and systolic function.   . Pneumonia   . Urethral stricture     Past Surgical History:  Procedure Laterality Date  . ABDOMINAL AORTOGRAM W/LOWER EXTREMITY N/A 03/30/2016   Procedure: Abdominal Aortogram w/Lower Extremity;  Surgeon: Angelia Mould, MD;  Location: [place] CV LAB;  Service: Cardiovascular;  Laterality: N/A;  . AMPUTATION Right 04/26/2016   Procedure: Right Below Knee Amputation;  Surgeon: Newt Minion, MD;  Location: [place];  Service: Orthopedics;  Laterality: Right;  . AV FISTULA PLACEMENT Right 09/08/2015   Procedure: INSERTION OF 4-84m x 45cm  ARTERIOVENOUS (AV) GORE-TEX GRAFT RIGHT UPPER  ARM;  Surgeon: CAngelia Mould MD;  [place]  Service: Vascular;  Laterality: Right;  . AV FISTULA PLACEMENT Left 01/14/2016   Procedure: CREATION OF LEFT UPPER ARM ARTERIOVENOUS FISTULA;  Surgeon: CAngelia Mould MD;  [place]  Service: Vascular;  Laterality: Left;  . BASCILIC VEIN TRANSPOSITION Right 08/22/2014   Procedure: RIGHT UPPER ARM BASCILIC VEIN TRANSPOSITION;  Surgeon: CAngelia Mould MD;  [place]  Service: Vascular;  Laterality: Right;  . BELOW KNEE LEG AMPUTATION Right 04/26/2016  . CARDIAC CATHETERIZATION    . CARDIAC DEFIBRILLATOR PLACEMENT  06/27/2013   Sub Q  BY DR Caryl Comes  . COLONOSCOPY WITH PROPOFOL N/A 07/22/2015   Procedure: COLONOSCOPY WITH PROPOFOL;  Surgeon: Doran Stabler, MD;  [facility];  Service: Gastroenterology;  Laterality: N/A;  . FEMORAL-POPLITEAL BYPASS GRAFT Right 03/31/2016   Procedure: BYPASS GRAFT FEMORAL-POPLITEAL ARTERY USING RIGHT GREATER SAPHENOUS NONREVERSED VEIN;  Surgeon: Angelia Mould, MD;  Location: [place];  Service: Vascular;  Laterality: Right;  . HERNIA REPAIR    . I&D EXTREMITY Right 03/31/2016   Procedure: IRRIGATION AND DEBRIDEMENT  FOOT;  Surgeon: Angelia Mould, MD;  Location: [place];  Service: Vascular;  Laterality: Right;  . IMPLANTABLE CARDIOVERTER DEFIBRILLATOR IMPLANT N/A 06/27/2013   Procedure: SUB Q ICD;  Surgeon: Deboraha Sprang, MD;  Location: [facility] CATH LAB;  Service: Cardiovascular;  Laterality: N/A;  . INTRAOPERATIVE ARTERIOGRAM Right 03/31/2016   Procedure: INTRA OPERATIVE ARTERIOGRAM;  Surgeon: Angelia Mould, MD;  Location: [place];  Service: Vascular;  Laterality: Right;  . IR GENERIC HISTORICAL Right 11/30/2015   IR THROMBECTOMY AV FISTULA W/THROMBOLYSIS/PTA INC/SHUNT/IMG RIGHT 11/30/2015 Aletta Edouard, MD [facility]  . IR GENERIC HISTORICAL  11/30/2015   IR US GUIDE VASC ACCESS RIGHT 11/30/2015 Aletta Edouard, MD [facility]  . IR GENERIC HISTORICAL Right 12/15/2015   IR THROMBECTOMY AV FISTULA W/THROMBOLYSIS/PTA/STENT INC/SHUNT/IMG RT 12/15/2015 Arne Cleveland, MD [facility]  . IR GENERIC HISTORICAL  12/15/2015   IR US GUIDE VASC ACCESS RIGHT 12/15/2015 Arne Cleveland, MD [facility]  . IR GENERIC HISTORICAL  12/28/2015   IR FLUORO GUIDE CV LINE RIGHT 12/28/2015 Marybelle Killings, MD [facility]  . IR GENERIC HISTORICAL  12/28/2015   IR US GUIDE VASC ACCESS RIGHT 12/28/2015 Marybelle Killings, MD [facility]  . LEFT A ND RIGHT HEART CATH  01/30/2013   DR Sung Amabile  . LEFT AND RIGHT HEART CATHETERIZATION WITH CORONARY ANGIOGRAM N/A 01/30/2013   Procedure: LEFT AND RIGHT HEART CATHETERIZATION WITH CORONARY ANGIOGRAM;  Surgeon: Jolaine Artist, MD;  [facility]: [facility] CATH LAB;  Service: Cardiovascular;  Laterality: N/A;  . PERIPHERAL VASCULAR CATHETERIZATION Right 01/26/2015   Procedure: A/V Fistulagram;  Surgeon: Angelia Mould, MD;  Location: [place] [facility];  Service: Cardiovascular;  Laterality: Right;  . reapea urethral surgery for recurrent obstruction  2011  . TOTAL KNEE ARTHROPLASTY Right 2007  . VEIN HARVEST Right 03/31/2016   Procedure: RIGHT GREATER SAPHENOUS VEIN HARVEST;  Surgeon:  Angelia Mould, MD;  Location: [place];  Service: Vascular;  Laterality: Right;    There were no vitals filed for this visit.      Subjective Assessment - 10/18/16 1530    Subjective No falls. He is wearing prosthesis all awake hours without issues.    Pertinent History Rt TTA, ESRD, DM, HTN, peripheral neuropathy, cardiomyopathy, CHF, COPD, AVF sg, R total knee replacement 2007   Limitations Lifting;Standing;Walking;House hold activities   Patient Stated Goals Walk without assistance or an AD in the community   Currently in Pain? No/denies             Prosthetics Assessment - 10/18/16 [place] with Skin check;Residual limb care;Care of non-amputated limb;Prosthetic cleaning;Ply sock cleaning;Correct ply sock adjustment;Proper wear schedule/adjustment;Proper weight-bearing schedule/adjustment   Donning prosthesis  Independent   Doffing prosthesis  Independent   Current prosthetic wear tolerance (days/week)  daily   Current prosthetic wear tolerance (#hours/day)  all awake hours drying as needed   Current prosthetic weight-bearing tolerance (hours/day)  Pt tolerates standing >  30 min without pain or discomfort.    Residual limb condition  intact                    [facility] Adult PT Treatment/Exercise - 10/18/16 1530      Transfers   Transfers Sit to Stand;Stand to Sit   Sit to Stand 7: Independent;Without upper extremity assist;From chair/3-in-1   Stand to Sit 7: Independent;Without upper extremity assist;To chair/3-in-1     Ambulation/Gait   Ambulation/Gait Yes   Ambulation/Gait Assistance 6: Modified independent (Device/Increase time)   Ambulation Distance (Feet) 130 Feet  no device except prosthesis neg furniture   Assistive device Prosthesis;None   Gait Pattern Within Functional Limits   Ambulation Surface Indoor;Level     High Level Balance   High Level Balance Activities Negotiating over  obstacles;Negotitating around obstacles;Side stepping;Backward walking;Head turns  with single point cane   High Level Balance Comments modified independent using proper technique     Therapeutic Activites    Lifting Pt demo lifting & carrying 15# box safely.    Work Economist patient push & pull cart safely, independently.      Prosthetics   Prosthetic Care Comments  PT instructed in ongoing follow-up with prosthetist and contact MD or prothetist with any skin changes between appts. He will probably need a socket revision at ~6 months with volume changes.                   PT Short Term Goals - 09/20/16 1539      PT SHORT TERM GOAL #1   Title Patient tolerates prosthesis wear >10hrs total /day with no skin issues & limb pain </= 6/10    Baseline 09/20/16: met today   Status Achieved     PT SHORT TERM GOAL #2   Title Patient verbalizes proper cleaning, weighing & wearing at dialysis.    Baseline 09/20/16: met today   Status Achieved     PT SHORT TERM GOAL #3   Title Patient ambulates 300' with RW or crutches & prosthesis with supervision.    Baseline 09/20/16: 500 feet with cane/prosthesis on indoor/outdoor surfaces with supervision   Time --   Period --   Status Achieved     PT SHORT TERM GOAL #4   Title Patient negotiates ramps, curbs with RW or crutches & stairs with 1 rail with supervision.    Baseline 09/20/16: met today with cane/prosthesis for ramp/curb and rail/cane for stairs   Time --   Period --   Status Achieved     PT SHORT TERM GOAL #5   Title Patient picks up objects from floor without UE support safely.    Baseline 09/20/16: met today    Time --   Period --   Status Achieved           PT Long Term Goals - 10/18/16 2038      PT LONG TERM GOAL #1   Title Patient verbalizes proper prosthetic care including wear/use at dialysis to enable safe use of prosthesis. (updated target date for all LTGs is 10/21/16)   Baseline 10/11/16: met today    Status Achieved     PT LONG TERM GOAL #2   Title Patient tolerates wear of prosthesis >90% of awake hours with no skin issues & limb pain </= 2/10   Baseline 10/11/16: met today   Status Achieved     PT LONG TERM GOAL #3   Title Patient ambulates 72' around household furniture  carrying plate & cup with prosthesis only modified independent.    Baseline 10/13/16: met today   Status Achieved     PT LONG TERM GOAL #4   Title Berg Balance >45/56 to indicate lower fall risk.    Baseline 10/13/16:    Status Achieved     PT LONG TERM GOAL #5   Title Patient ambulates 500' outdoors including grass with cane & prosthesis modified independent to enable community mobility.    Baseline 10/13/16: met today   Status Achieved     PT LONG TERM GOAL #6   Title Patient negotiates ramps, curbs & stairs (1 rail) with cane & prosthesis modified independent for community access.    Baseline 10/13/16: met today   Status Achieved     PT LONG TERM GOAL #7   Title Patient able to lift/ carry 15# box, push /pull items & negotiate around/over ostacles with prosthesis modified independent for household tasks.    Baseline MET 10/18/16   Time 12   Period Weeks   Status Achieved               Plan - 10/18/16 2039    Clinical Impression Statement Patient met all long term goals. He appears to be functioning at full community level with his prosthesis. He ambulates household & basic community with prosthesis only and full community including grass with cane & prosthesis. He is wearing prosthesis all awake hours and verbalizes understanding of prosthetic care.    PT Frequency 2x / week   PT Duration 12 weeks   PT Treatment/Interventions ADLs/Self Care Home Management;DME Instruction;Gait training;Stair training;Functional mobility training;Therapeutic activities;Therapeutic exercise;Balance training;Neuromuscular re-education;Patient/family education;Prosthetic Training   PT Next Visit Plan discharge    Consulted and Agree with Plan of Care Patient      Patient will benefit from skilled therapeutic intervention in order to improve the following deficits and impairments:  Abnormal gait, Decreased activity tolerance, Decreased balance, Decreased knowledge of use of DME, Decreased mobility, Postural dysfunction, Prosthetic Dependency  Visit Diagnosis: Other abnormalities of gait and mobility  Unsteadiness on feet  Muscle weakness (generalized)  Repeated falls     Problem List Patient Active Problem List   Diagnosis Date Noted  . Onychomycosis 08/16/2016  . Status post unilateral below knee amputation, right ([place]) 04/26/2016  . Cutaneous abscess of right foot 04/19/2016  . Diabetic foot infection ([place])   . Diabetic foot ulcer ([place]) 03/30/2016  . Bilateral carotid bruits 03/30/2016  . Diabetes mellitus with complication ([place])   . Anemia due to chronic kidney disease   . ESRD (end stage renal disease) on dialysis ([place]) 11/30/2015  . Acute respiratory failure with hypoxia ([place]) 11/29/2015  . At high risk for falls 09/29/2014  . Peripheral vascular disease ([place]) 09/29/2014  . Osteoarthritis of both knees 01/07/2014  . Osteoarthritis of multiple joints 10/06/2013  . NICM (nonischemic cardiomyopathy) ([place]) 06/27/2013  . CAD (coronary artery disease) 03/13/2013  . Abnormal nuclear stress test 01/27/2013  . Chronic systolic congestive heart failure ([place]) 01/27/2013  . COPD (chronic obstructive pulmonary disease) ([place]) 12/27/2012  . Diabetic polyneuropathy associated with type 2 diabetes mellitus ([place]) 02/10/2012  . PYELITIS/PYELONEPHRITIS DISEASES CLASSIFIED ELSW 09/28/2009  . URINARY CALCULUS 09/28/2009  . Adjustment disorder with depressed mood 08/21/2009  . Poorly controlled type II diabetes mellitus with renal complication ([place]) 07/86/7544  . Hyperlipidemia 04/14/2008  . Essential hypertension 04/14/2008  . ALLERGIC RHINITIS 04/14/2008   PHYSICAL THERAPY DISCHARGE  SUMMARY  Visits from Start of Care:  15  Current functional level related to goals / functional outcomes: See above   Remaining deficits: See above   Education / Equipment: Prosthetic care Plan: Patient agrees to discharge.  Patient goals were met. Patient is being discharged due to meeting the stated rehab goals.  ?????         Jamey Reas PT, DPT 10/19/2016, 8:42 PM  [facility] [address] [place], [place], [address] Phone: [phone]   Fax:  [phone]  Name: SAI ZINN MRN: [id number] Date of Birth: Jun 18, 1960

## 2016-10-20 ENCOUNTER — Encounter: Payer: BLUE CROSS/BLUE SHIELD | Admitting: Physical Therapy

## 2016-10-24 ENCOUNTER — Ambulatory Visit (INDEPENDENT_AMBULATORY_CARE_PROVIDER_SITE_OTHER): Payer: BLUE CROSS/BLUE SHIELD | Admitting: Family Medicine

## 2016-10-24 ENCOUNTER — Encounter: Payer: Self-pay | Admitting: Family Medicine

## 2016-10-24 VITALS — BP 148/80 | HR 81 | Temp 97.8°F | Wt 209.5 lb

## 2016-10-24 DIAGNOSIS — L723 Sebaceous cyst: Secondary | ICD-10-CM

## 2016-10-24 DIAGNOSIS — I10 Essential (primary) hypertension: Secondary | ICD-10-CM

## 2016-10-24 DIAGNOSIS — E1165 Type 2 diabetes mellitus with hyperglycemia: Secondary | ICD-10-CM

## 2016-10-24 DIAGNOSIS — E1129 Type 2 diabetes mellitus with other diabetic kidney complication: Secondary | ICD-10-CM

## 2016-10-24 DIAGNOSIS — Q809 Congenital ichthyosis, unspecified: Secondary | ICD-10-CM | POA: Diagnosis not present

## 2016-10-24 DIAGNOSIS — E118 Type 2 diabetes mellitus with unspecified complications: Secondary | ICD-10-CM | POA: Diagnosis not present

## 2016-10-24 LAB — POCT GLYCOSYLATED HEMOGLOBIN (HGB A1C): Hemoglobin A1C: 13.8

## 2016-10-24 NOTE — Patient Instructions (Addendum)
Set up follow up for cyst excision. Start the [employer] in place of the Levemir and may start 44 units once daily If fasting sugars consistently > 130 may increase the Basaglar 2 units every few days until fastings < 130. Try some vaseline to skin on buttocks.

## 2016-10-24 NOTE — Progress Notes (Signed)
Subjective:     Patient ID: Anthony Bullock, male   DOB: April 17, 1960, 56 y.o.   MRN: [id number]  HPI Patient seen for medical follow-up for several items. He has complicated past history with history of poorly controlled type 2 diabetes, chronic kidney disease, peripheral vascular disease, hypertension, chronic systolic heart failure, history of right below-knee amputation secondary to complicated infection  Type 2 diabetes. His been followed by nephrology. He is recently ran out of both his long-acting insulin which is Levemir 44 units daily and also Humalog. Poor compliance with diet. He states his appetite is increased and has gained some weight recently. Overall feels well. Hypertension has been managed by nephrology  Recently noticed a "knot "right parietal area. Sometimes sore to touch. Painful with brushing clear. No drainage.  Sore area on his buttocks bilaterally. He's had some irritative skin here for quite some time as tried multiple over-the-counter topicals without much improvement. No drainage.  Past Medical History:  Diagnosis Date  . 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 Allergic rhinitis   . Anemia   . Arthritis   . Chronic systolic heart failure ([place])    a. ECHO (12/2012) EF 25-30%, HK entireanteroseptal myocardium //  b.  EF 25%, diffuse HK, grade 1 diastolic dysfunction, MAC, mild LAE, normal RVSF, trivial pericardial effusion  . COPD (chronic obstructive pulmonary disease) ([place])   . Diabetes mellitus type II   . Diabetic nephropathy ([place])   . Diabetic neuropathy ([place])   . ESRD on hemodialysis [facility])    started HD June 2017, goes to [facility] HD unit, Dr Hinda Lenis  . History of cardiac catheterization    a.Myoview 1/15:  There is significant left ventricular dysfunction. There may be slight scar at the apex. There is no significant ischemia. LV Ejection Fraction: 27%  //  b. RHC/LHC (1/15) with mean RA 6, PA 47/22 mean 33, mean PCWP  20, PVR 2.5 WU, CI 2.5; 80% dLAD stenosis, 70% diffuse large D.   . History of kidney stones   . Hyperlipidemia   . Hypertension   . Kidney stones   . NICM (nonischemic cardiomyopathy) ([place])    Primarily nonischemic. Echo (12/14) with EF 25-30%. Echo (3/15) with EF 25%, mild to moderately dilated LV, normal RV size and systolic function.   . Pneumonia   . Urethral stricture    Past Surgical History:  Procedure Laterality Date  . ABDOMINAL AORTOGRAM W/LOWER EXTREMITY N/A 03/30/2016   Procedure: Abdominal Aortogram w/Lower Extremity;  Surgeon: Angelia Mould, MD;  Location: [place] [facility];  Service: Cardiovascular;  Laterality: N/A;  . AMPUTATION Right 04/26/2016   Procedure: Right Below Knee Amputation;  Surgeon: Newt Minion, MD;  Location: [place];  Service: Orthopedics;  Laterality: Right;  . AV FISTULA PLACEMENT Right 09/08/2015   Procedure: INSERTION OF 4-46m x 45cm  ARTERIOVENOUS (AV) GORE-TEX GRAFT RIGHT UPPER  ARM;  Surgeon: CAngelia Mould MD;  [place]  Service: Vascular;  Laterality: Right;  . AV FISTULA PLACEMENT Left 01/14/2016   Procedure: CREATION OF LEFT UPPER ARM ARTERIOVENOUS FISTULA;  Surgeon: CAngelia Mould MD;  [place]  Service: Vascular;  Laterality: Left;  . BASCILIC VEIN TRANSPOSITION Right 08/22/2014   Procedure: RIGHT UPPER ARM BASCILIC VEIN TRANSPOSITION;  Surgeon: CAngelia Mould MD;  [place]  Service: Vascular;  Laterality: Right;  . BELOW KNEE LEG AMPUTATION Right 04/26/2016  . CARDIAC CATHETERIZATION    . CARDIAC  DEFIBRILLATOR PLACEMENT  06/27/2013   Sub Q       BY DR Caryl Comes  . COLONOSCOPY WITH PROPOFOL N/A 07/22/2015   Procedure: COLONOSCOPY WITH PROPOFOL;  Surgeon: Doran Stabler, MD;  [facility];  Service: Gastroenterology;  Laterality: N/A;  . FEMORAL-POPLITEAL BYPASS GRAFT Right 03/31/2016   Procedure: BYPASS GRAFT FEMORAL-POPLITEAL ARTERY USING RIGHT GREATER SAPHENOUS NONREVERSED VEIN;   Surgeon: Angelia Mould, MD;  Location: [place];  Service: Vascular;  Laterality: Right;  . HERNIA REPAIR    . I&D EXTREMITY Right 03/31/2016   Procedure: IRRIGATION AND DEBRIDEMENT FOOT;  Surgeon: Angelia Mould, MD;  Location: [place];  Service: Vascular;  Laterality: Right;  . IMPLANTABLE CARDIOVERTER DEFIBRILLATOR IMPLANT N/A 06/27/2013   Procedure: SUB Q ICD;  Surgeon: Deboraha Sprang, MD;  Location: [facility] CATH LAB;  Service: Cardiovascular;  Laterality: N/A;  . INTRAOPERATIVE ARTERIOGRAM Right 03/31/2016   Procedure: INTRA OPERATIVE ARTERIOGRAM;  Surgeon: Angelia Mould, MD;  Location: [place];  Service: Vascular;  Laterality: Right;  . IR GENERIC HISTORICAL Right 11/30/2015   IR THROMBECTOMY AV FISTULA W/THROMBOLYSIS/PTA INC/SHUNT/IMG RIGHT 11/30/2015 Aletta Edouard, MD [facility]  . IR GENERIC HISTORICAL  11/30/2015   IR US GUIDE VASC ACCESS RIGHT 11/30/2015 Aletta Edouard, MD [facility]  . IR GENERIC HISTORICAL Right 12/15/2015   IR THROMBECTOMY AV FISTULA W/THROMBOLYSIS/PTA/STENT INC/SHUNT/IMG RT 12/15/2015 Arne Cleveland, MD [facility]  . IR GENERIC HISTORICAL  12/15/2015   IR US GUIDE VASC ACCESS RIGHT 12/15/2015 Arne Cleveland, MD [facility]  . IR GENERIC HISTORICAL  12/28/2015   IR FLUORO GUIDE CV LINE RIGHT 12/28/2015 Marybelle Killings, MD [facility]  . IR GENERIC HISTORICAL  12/28/2015   IR US GUIDE VASC ACCESS RIGHT 12/28/2015 Marybelle Killings, MD [facility]  . LEFT A ND RIGHT HEART CATH  01/30/2013   DR Sung Amabile  . LEFT AND RIGHT HEART CATHETERIZATION WITH CORONARY ANGIOGRAM N/A 01/30/2013   Procedure: LEFT AND RIGHT HEART CATHETERIZATION WITH CORONARY ANGIOGRAM;  Surgeon: Jolaine Artist, MD;  Location: [facility] CATH LAB;  Service: Cardiovascular;  Laterality: N/A;  . PERIPHERAL VASCULAR CATHETERIZATION Right 01/26/2015   Procedure: A/V Fistulagram;  Surgeon: Angelia Mould, MD;  Location: [place] CV LAB;  Service: Cardiovascular;  Laterality: Right;  .  reapea urethral surgery for recurrent obstruction  2011  . TOTAL KNEE ARTHROPLASTY Right 2007  . VEIN HARVEST Right 03/31/2016   Procedure: RIGHT GREATER SAPHENOUS VEIN HARVEST;  Surgeon: Angelia Mould, MD;  Location: [place];  Service: Vascular;  Laterality: Right;    reports that he quit smoking about 6 years ago. His smoking use included Cigarettes. He has a 64.00 pack-year smoking history. He has never used smokeless tobacco. He reports that he does not drink alcohol or use drugs. family history includes Alcohol abuse in his father; Bladder Cancer in his mother; Diabetes in his maternal grandmother; Heart Problems in his maternal grandmother; Heart disease in his maternal grandfather; Melanoma in his father; Prostate cancer in his maternal grandfather; Stroke in his maternal grandmother. Allergies  Allergen Reactions  . Morphine Sulfate Rash and Other (See Comments)    Itches all over, red spots     Review of Systems  Constitutional: Negative for chills, fatigue, fever and unexpected weight change.  Eyes: Negative for visual disturbance.  Respiratory: Negative for cough, chest tightness and shortness of breath.   Cardiovascular: Negative for chest pain, palpitations and leg swelling.  Neurological: Negative for dizziness, syncope, weakness, light-headedness and  headaches.       Objective:   Physical Exam  Constitutional: He is oriented to person, place, and time. He appears well-developed and well-nourished.  HENT:  Right Ear: External ear normal.  Left Ear: External ear normal.  Mouth/Throat: Oropharynx is clear and moist.  Eyes: Pupils are equal, round, and reactive to light.  Neck: Neck supple. No thyromegaly present.  Cardiovascular: Normal rate and regular rhythm.   Pulmonary/Chest: Effort normal and breath sounds normal. No respiratory distress. He has no wheezes. He has no rales.  Musculoskeletal: He exhibits no edema.  Neurological: He is alert and oriented to  person, place, and time.  Skin:  Left foot reveals no skin lesions. Foot warm to touch with good capillary refill.  Patient has rounded mobile sebaceous cyst right parietal area scalp. Minimal overlying erythema. No fluctuance. Minimally tender.  He has a dry patch of skin which is somewhat thickened and ichthyotic right and left buttock but no actual skin breakdown.       Assessment:     #1 type 2 diabetes poorly controlled with history of poor compliance with medication and diet with A1c today 13.4%  #2 chronic kidney disease and hypertension followed by nephrology  #3 sebaceous cyst right side of scalp  #4 dry skin involving both buttocks but no evidence for decubitus ulcer this time    Plan:     -Patient plans to get flu vaccine tomorrow at nephrologist -Samples of Basaglar insulin to use in place of Levemir since he is currently out and resume 44 units once daily and titrate up 2 units every few days until fasting blood sugars consistently around 130 -He is encouraged to get back on his Humalog insulin as soon as possible 3 times daily with meals -Discussed pros and cons of cyst excision he will schedule for cyst excision right scalp as this is becoming more symptomatic -Try some Vaseline to skin on buttocks to try to reduce friction on the area  [name] MD [facility]

## 2016-10-25 ENCOUNTER — Encounter: Payer: BLUE CROSS/BLUE SHIELD | Admitting: Physical Therapy

## 2016-10-27 ENCOUNTER — Encounter: Payer: BLUE CROSS/BLUE SHIELD | Admitting: Physical Therapy

## 2016-10-31 ENCOUNTER — Ambulatory Visit (INDEPENDENT_AMBULATORY_CARE_PROVIDER_SITE_OTHER): Payer: BLUE CROSS/BLUE SHIELD | Admitting: Family Medicine

## 2016-10-31 ENCOUNTER — Encounter: Payer: Self-pay | Admitting: Family Medicine

## 2016-10-31 VITALS — BP 150/80 | HR 80 | Temp 98.1°F | Wt 211.3 lb

## 2016-10-31 DIAGNOSIS — L723 Sebaceous cyst: Secondary | ICD-10-CM

## 2016-10-31 NOTE — Progress Notes (Signed)
Subjective:     Patient ID: Anthony Bullock, male   DOB: [birth year], 56 y.o.   MRN: [id number]  HPI Patient is here requesting sebaceous cyst excision of the scalp in the right parieto-occipital region.  He brought this up recently and this is a procedure only visit.  This has been present for several years. Sometimes irritated with brushing hair. No recent drainage. He takes aspirin but no anticoagulants.  Past Medical History:  Diagnosis Date  . 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 Allergic rhinitis   . Anemia   . Arthritis   . Chronic systolic heart failure ([place])    a. ECHO (12/2012) EF 25-30%, HK entireanteroseptal myocardium //  b.  EF 25%, diffuse HK, grade 1 diastolic dysfunction, MAC, mild LAE, normal RVSF, trivial pericardial effusion  . COPD (chronic obstructive pulmonary disease) ([place])   . Diabetes mellitus type II   . Diabetic nephropathy ([place])   . Diabetic neuropathy ([place])   . ESRD on hemodialysis [facility])    started HD June 2017, goes to [facility] HD unit, Dr Hinda Lenis  . History of cardiac catheterization    a.Myoview 1/15:  There is significant left ventricular dysfunction. There may be slight scar at the apex. There is no significant ischemia. LV Ejection Fraction: 27%  //  b. RHC/LHC (1/15) with mean RA 6, PA 47/22 mean 33, mean PCWP 20, PVR 2.5 WU, CI 2.5; 80% dLAD stenosis, 70% diffuse large D.   . History of kidney stones   . Hyperlipidemia   . Hypertension   . Kidney stones   . NICM (nonischemic cardiomyopathy) ([place])    Primarily nonischemic. Echo (12/14) with EF 25-30%. Echo (3/15) with EF 25%, mild to moderately dilated LV, normal RV size and systolic function.   . Pneumonia   . Urethral stricture    Past Surgical History:  Procedure Laterality Date  . ABDOMINAL AORTOGRAM W/LOWER EXTREMITY N/A 03/30/2016   Procedure: Abdominal Aortogram w/Lower Extremity;  Surgeon: Angelia Mould, MD;  Location: [place] [facility];   Service: Cardiovascular;  Laterality: N/A;  . AMPUTATION Right 04/26/2016   Procedure: Right Below Knee Amputation;  Surgeon: Newt Minion, MD;  Location: [place];  Service: Orthopedics;  Laterality: Right;  . AV FISTULA PLACEMENT Right 09/08/2015   Procedure: INSERTION OF 4-78m x 45cm  ARTERIOVENOUS (AV) GORE-TEX GRAFT RIGHT UPPER  ARM;  Surgeon: CAngelia Mould MD;  [place]  Service: Vascular;  Laterality: Right;  . AV FISTULA PLACEMENT Left 01/14/2016   Procedure: CREATION OF LEFT UPPER ARM ARTERIOVENOUS FISTULA;  Surgeon: CAngelia Mould MD;  [place]  Service: Vascular;  Laterality: Left;  . BASCILIC VEIN TRANSPOSITION Right 08/22/2014   Procedure: RIGHT UPPER ARM BASCILIC VEIN TRANSPOSITION;  Surgeon: CAngelia Mould MD;  [place]  Service: Vascular;  Laterality: Right;  . BELOW KNEE LEG AMPUTATION Right 04/26/2016  . CARDIAC CATHETERIZATION    . CARDIAC DEFIBRILLATOR PLACEMENT  06/27/2013   Sub Q       BY DR KCaryl Comes . COLONOSCOPY WITH PROPOFOL N/A 07/22/2015   Procedure: COLONOSCOPY WITH PROPOFOL;  Surgeon: HDoran Stabler MD;  [facility];  Service: Gastroenterology;  Laterality: N/A;  . FEMORAL-POPLITEAL BYPASS GRAFT Right 03/31/2016   Procedure: BYPASS GRAFT FEMORAL-POPLITEAL ARTERY USING RIGHT GREATER SAPHENOUS NONREVERSED VEIN;  Surgeon: CAngelia Mould MD;  [place]  Service: Vascular;  Laterality: Right;  . HERNIA REPAIR    .  I&D EXTREMITY Right 03/31/2016   Procedure: IRRIGATION AND DEBRIDEMENT FOOT;  Surgeon: Angelia Mould, MD;  Location: [place];  Service: Vascular;  Laterality: Right;  . IMPLANTABLE CARDIOVERTER DEFIBRILLATOR IMPLANT N/A 06/27/2013   Procedure: SUB Q ICD;  Surgeon: Deboraha Sprang, MD;  Location: [facility] CATH LAB;  Service: Cardiovascular;  Laterality: N/A;  . INTRAOPERATIVE ARTERIOGRAM Right 03/31/2016   Procedure: INTRA OPERATIVE ARTERIOGRAM;  Surgeon: Angelia Mould, MD;  Location: [place];  Service: Vascular;  Laterality: Right;  . IR GENERIC HISTORICAL Right 11/30/2015   IR THROMBECTOMY AV FISTULA W/THROMBOLYSIS/PTA INC/SHUNT/IMG RIGHT 11/30/2015 Aletta Edouard, MD [facility]  . IR GENERIC HISTORICAL  11/30/2015   IR US GUIDE VASC ACCESS RIGHT 11/30/2015 Aletta Edouard, MD [facility]  . IR GENERIC HISTORICAL Right 12/15/2015   IR THROMBECTOMY AV FISTULA W/THROMBOLYSIS/PTA/STENT INC/SHUNT/IMG RT 12/15/2015 Arne Cleveland, MD [facility]  . IR GENERIC HISTORICAL  12/15/2015   IR US GUIDE VASC ACCESS RIGHT 12/15/2015 Arne Cleveland, MD [facility]  . IR GENERIC HISTORICAL  12/28/2015   IR FLUORO GUIDE CV LINE RIGHT 12/28/2015 Marybelle Killings, MD [facility]  . IR GENERIC HISTORICAL  12/28/2015   IR US GUIDE VASC ACCESS RIGHT 12/28/2015 Marybelle Killings, MD [facility]  . LEFT A ND RIGHT HEART CATH  01/30/2013   DR Sung Amabile  . LEFT AND RIGHT HEART CATHETERIZATION WITH CORONARY ANGIOGRAM N/A 01/30/2013   Procedure: LEFT AND RIGHT HEART CATHETERIZATION WITH CORONARY ANGIOGRAM;  Surgeon: Jolaine Artist, MD;  Location: [facility] CATH LAB;  Service: Cardiovascular;  Laterality: N/A;  . PERIPHERAL VASCULAR CATHETERIZATION Right 01/26/2015   Procedure: A/V Fistulagram;  Surgeon: Angelia Mould, MD;  Location: [place] [facility];  Service: Cardiovascular;  Laterality: Right;  . reapea urethral surgery for recurrent obstruction  2011  . TOTAL KNEE ARTHROPLASTY Right 2007  . VEIN HARVEST Right 03/31/2016   Procedure: RIGHT GREATER SAPHENOUS VEIN HARVEST;  Surgeon: Angelia Mould, MD;  Location: [place];  Service: Vascular;  Laterality: Right;    reports that he quit smoking about 6 years ago. His smoking use included Cigarettes. He has a 64.00 pack-year smoking history. He has never used smokeless tobacco. He reports that he does not drink alcohol or use drugs. family history includes Alcohol abuse in his father; Bladder Cancer in his mother; Diabetes in his maternal  grandmother; Heart Problems in his maternal grandmother; Heart disease in his maternal grandfather; Melanoma in his father; Prostate cancer in his maternal grandfather; Stroke in his maternal grandmother. Allergies  Allergen Reactions  . Morphine Sulfate Rash and Other (See Comments)    Itches all over, red spots     Review of Systems  Constitutional: Negative for appetite change, chills, fever and unexpected weight change.  Respiratory: Negative for shortness of breath.   Cardiovascular: Negative for chest pain.       Objective:   Physical Exam  Constitutional: He appears well-developed and well-nourished.  Cardiovascular: Normal rate and regular rhythm.   Pulmonary/Chest: Effort normal and breath sounds normal. No respiratory distress. He has no wheezes. He has no rales.  Skin:  Patient has rounded mobile nontender cystic lesion right parieto-occipital area. No overlying erythema. No fluctuance. Diameter approximately 1.2 cm       Assessment:     Sebaceous cyst of right scalp. Patient requesting excision    Plan:     -We discussed risk and benefits including risk of bleeding, bruising, infection, scarring. Also discussed the fact that sebaceous cyst  sometimes can be challenging to remove in entirety and may regrow-especially they've been scarred in. Patient consented -Anesthesia with 1% Xylocaine with epinephrine -Prepped skin with Betadine -Small 1 cm elliptical excision over the area of cyst. -Identified typical sebaceous cyst sac and contents were expressed. Sidewalls of the sac were very scarred in and challenging to remove. We dissected away portions of the wall as much as possible and irrigated wound cavity with normal saline. Minimal bleeding-controlled with pressure.. -Wound closed with 3 sutures of 4-0 Ethilon. -Topical antibiotic applied -Return in one week for suture removal and sooner for any signs of secondary infection which were reviewed with patient  [name] MD [facility] at [facility]

## 2016-10-31 NOTE — Patient Instructions (Signed)
Keep dry first 24 hours Then clean daily with soap and water Follow up for any signs of infection- redness, pus drainage, pain, swelling Return in about one week for suture removal.

## 2016-11-01 ENCOUNTER — Encounter: Payer: BLUE CROSS/BLUE SHIELD | Admitting: Physical Therapy

## 2016-11-03 ENCOUNTER — Encounter: Payer: BLUE CROSS/BLUE SHIELD | Admitting: Physical Therapy

## 2016-11-08 ENCOUNTER — Encounter: Payer: BLUE CROSS/BLUE SHIELD | Admitting: Physical Therapy

## 2016-11-10 ENCOUNTER — Encounter: Payer: BLUE CROSS/BLUE SHIELD | Admitting: Physical Therapy

## 2016-11-14 ENCOUNTER — Encounter: Payer: Self-pay | Admitting: Family Medicine

## 2016-11-14 ENCOUNTER — Ambulatory Visit (INDEPENDENT_AMBULATORY_CARE_PROVIDER_SITE_OTHER): Payer: BLUE CROSS/BLUE SHIELD | Admitting: Family Medicine

## 2016-11-14 VITALS — BP 138/68 | HR 85 | Temp 98.6°F | Ht 70.0 in | Wt 202.4 lb

## 2016-11-14 DIAGNOSIS — Z4802 Encounter for removal of sutures: Secondary | ICD-10-CM

## 2016-11-14 DIAGNOSIS — L02811 Cutaneous abscess of head [any part, except face]: Secondary | ICD-10-CM | POA: Diagnosis not present

## 2016-11-14 DIAGNOSIS — L723 Sebaceous cyst: Secondary | ICD-10-CM | POA: Diagnosis not present

## 2016-11-14 NOTE — Progress Notes (Signed)
Subjective:     Patient ID: Anthony Bullock, male   DOB: February 07, 1960, 56 y.o.   MRN: [id number]  HPI Patient here for suture removal following sebaceous cyst excision right scalp. This was very scarred and difficult to remove . He's had no problems with recovery. No drainage. No soreness. One of the sutures came out on its own. No fever.  Past Medical History:  Diagnosis Date  . 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 Allergic rhinitis   . Anemia   . Arthritis   . Chronic systolic heart failure ([place])    a. ECHO (12/2012) EF 25-30%, HK entireanteroseptal myocardium //  b.  EF 25%, diffuse HK, grade 1 diastolic dysfunction, MAC, mild LAE, normal RVSF, trivial pericardial effusion  . COPD (chronic obstructive pulmonary disease) ([place])   . Diabetes mellitus type II   . Diabetic nephropathy ([place])   . Diabetic neuropathy ([place])   . ESRD on hemodialysis [facility])    started HD June 2017, goes to [facility] HD unit, Dr Hinda Lenis  . History of cardiac catheterization    a.Myoview 1/15:  There is significant left ventricular dysfunction. There may be slight scar at the apex. There is no significant ischemia. LV Ejection Fraction: 27%  //  b. RHC/LHC (1/15) with mean RA 6, PA 47/22 mean 33, mean PCWP 20, PVR 2.5 WU, CI 2.5; 80% dLAD stenosis, 70% diffuse large D.   . History of kidney stones   . Hyperlipidemia   . Hypertension   . Kidney stones   . NICM (nonischemic cardiomyopathy) ([place])    Primarily nonischemic. Echo (12/14) with EF 25-30%. Echo (3/15) with EF 25%, mild to moderately dilated LV, normal RV size and systolic function.   . Pneumonia   . Urethral stricture    Past Surgical History:  Procedure Laterality Date  . BELOW KNEE LEG AMPUTATION Right 04/26/2016  . CARDIAC CATHETERIZATION    . CARDIAC DEFIBRILLATOR PLACEMENT  06/27/2013   Sub Q       BY DR Caryl Comes  . HERNIA REPAIR    . IR GENERIC HISTORICAL Right 11/30/2015   IR THROMBECTOMY AV FISTULA  W/THROMBOLYSIS/PTA INC/SHUNT/IMG RIGHT 11/30/2015 Aletta Edouard, MD [facility]  . IR GENERIC HISTORICAL  11/30/2015   IR US GUIDE VASC ACCESS RIGHT 11/30/2015 Aletta Edouard, MD [facility]  . IR GENERIC HISTORICAL Right 12/15/2015   IR THROMBECTOMY AV FISTULA W/THROMBOLYSIS/PTA/STENT INC/SHUNT/IMG RT 12/15/2015 Arne Cleveland, MD [facility]  . IR GENERIC HISTORICAL  12/15/2015   IR US GUIDE VASC ACCESS RIGHT 12/15/2015 Arne Cleveland, MD [facility]  . IR GENERIC HISTORICAL  12/28/2015   IR FLUORO GUIDE CV LINE RIGHT 12/28/2015 Marybelle Killings, MD [facility]  . IR GENERIC HISTORICAL  12/28/2015   IR US GUIDE VASC ACCESS RIGHT 12/28/2015 Marybelle Killings, MD [facility]  . LEFT A ND RIGHT HEART CATH  01/30/2013   DR Sung Amabile  . reapea urethral surgery for recurrent obstruction  2011  . TOTAL KNEE ARTHROPLASTY Right 2007    reports that he quit smoking about 6 years ago. His smoking use included cigarettes. He has a 64.00 pack-year smoking history. he has never used smokeless tobacco. He reports that he does not drink alcohol or use drugs. family history includes Alcohol abuse in his father; Bladder Cancer in his mother; Diabetes in his maternal grandmother; Heart Problems in his maternal grandmother; Heart disease in his maternal grandfather; Melanoma in his father; Prostate cancer in his maternal grandfather; Stroke  in his maternal grandmother. Allergies  Allergen Reactions  . Morphine Sulfate Rash and Other (See Comments)    Itches all over, red spots     Review of Systems  Constitutional: Negative for chills and fever.       Objective:   Physical Exam  Constitutional: He appears well-developed and well-nourished.  Cardiovascular: Normal rate and regular rhythm.  Pulmonary/Chest: Effort normal and breath sounds normal. No respiratory distress. He has no wheezes. He has no rales.  Skin:  Wound site from recent sebaceous cyst excision examined. He has some mild swelling and  fluctuance along the anterior portion but no erythema. Nontender. No warmth.       Assessment:     Recent sebaceous cyst excision right scalp.  He has small amount of purulence in pocket along anterior aspect of wound.  No cellulitis changes.    Plan:     -2 sutures removed -He did have some fluctuance as above and we used a #25-gauge needle and unroofed area of fluctuance and expressible bit of purulence. This flattened out completely following this. -He does not have any cellulitis changes. Recommend some warm compresses couple times daily for the next few days and follow-up for any recurrent swelling or other concerns  [name] MD [facility] at [facility]

## 2016-11-15 ENCOUNTER — Encounter: Payer: BLUE CROSS/BLUE SHIELD | Admitting: Physical Therapy

## 2016-11-17 ENCOUNTER — Encounter (INDEPENDENT_AMBULATORY_CARE_PROVIDER_SITE_OTHER): Payer: Self-pay | Admitting: Orthopedic Surgery

## 2016-11-17 ENCOUNTER — Ambulatory Visit (INDEPENDENT_AMBULATORY_CARE_PROVIDER_SITE_OTHER): Payer: BLUE CROSS/BLUE SHIELD | Admitting: Orthopedic Surgery

## 2016-11-17 VITALS — Ht 70.0 in | Wt 202.0 lb

## 2016-11-17 DIAGNOSIS — Z89511 Acquired absence of right leg below knee: Secondary | ICD-10-CM

## 2016-11-17 DIAGNOSIS — B351 Tinea unguium: Secondary | ICD-10-CM

## 2016-11-17 NOTE — Progress Notes (Signed)
Office Visit Note   Patient: Anthony Bullock           Date of Birth: Dec 11, 1960           MRN: [id number] Visit Date: 11/17/2016              Requested by: [name], MD [place], [place] [id number] PCP: [name], MD  Chief Complaint  Patient presents with  . Right Leg - Follow-up    Right BKA 04/26/16      HPI: Patient is a 56 year old gentleman status post right transtibial amputation who presents for evaluation for the residual limb on the right as well as painful onychomycotic nails on the left. Patient doing well overall, ambulating in prosthesis on right.   Assessment & Plan: Visit Diagnoses:  1. Onychomycosis   2. Status post unilateral below knee amputation, right ([place])     Plan: Nails were trimmed 5 on the left foot. Patient will follow up with [name] in 3 months for reevaluation or sooner if he develops any blisters or ulcers.  Follow-Up Instructions: Return in about 3 months (around 02/17/2017).   Ortho Exam  Patient is alert, oriented, no adenopathy, well-dressed, normal affect, normal respiratory effort. Examination patient has an antalgic gait. Examination the right transtibial amputation there was a well consolidated residual limb no ulcers no callus no skin breakdown. There is no cellulitis no signs of infection. Examination the left foot he has no plantar ulcers no venous stasis ulcers he does have thickened discolored onychomycotic nails 5 he is unable to safely trim nails on his own due to his insensate diabetic neuropathy and the nails were trimmed 5 without complications.  Imaging: No results found.  Labs: Lab Results  Component Value Date   HGBA1C 13.8 10/24/2016   HGBA1C 11.3 (H) 03/30/2016   HGBA1C 8.1 (H) 11/29/2015   ESRSEDRATE 94 (H) 03/30/2016   ESRSEDRATE 18 11/30/2012   CRP 16.1 (H) 03/30/2016   REPTSTATUS 04/05/2016 FINAL 03/31/2016   REPTSTATUS 04/04/2016 FINAL 03/31/2016   GRAMSTAIN  03/31/2016   RARE WBC PRESENT, PREDOMINANTLY PMN NO ORGANISMS SEEN    CULT NO ANAEROBES ISOLATED 03/31/2016   CULT  03/31/2016    RARE STENOTROPHOMONAS MALTOPHILIA RARE ENTEROBACTER SPECIES    LABORGA STENOTROPHOMONAS MALTOPHILIA 03/31/2016   LABORGA ENTEROBACTER SPECIES 03/31/2016    Orders:  No orders of the defined types were placed in this encounter.  No orders of the defined types were placed in this encounter.    Procedures: No procedures performed  Clinical Data: No additional findings.  ROS:  All other systems negative, except as noted in the HPI. Review of Systems  Constitutional: Negative for chills and fever.  Skin: Negative for color change and wound.    Objective: Vital Signs: Ht _0  (1.778 m)   Wt 202 lb (91.6 kg)   BMI 28.98 kg/m   Specialty Comments:  No specialty comments available.  PMFS History: Patient Active Problem List   Diagnosis Date Noted  . Onychomycosis 08/16/2016  . Status post unilateral below knee amputation, right ([place]) 04/26/2016  . Bilateral carotid bruits 03/30/2016  . Diabetes mellitus with complication ([place])   . Anemia due to chronic kidney disease   . ESRD (end stage renal disease) on dialysis ([place]) 11/30/2015  . Acute respiratory failure with hypoxia ([place]) 11/29/2015  . At high risk for falls 09/29/2014  . Peripheral vascular disease ([place]) 09/29/2014  . Osteoarthritis of both knees 01/07/2014  .  Osteoarthritis of multiple joints 10/06/2013  . NICM (nonischemic cardiomyopathy) ([place]) 06/27/2013  . CAD (coronary artery disease) 03/13/2013  . Abnormal nuclear stress test 01/27/2013  . Chronic systolic congestive heart failure ([place]) 01/27/2013  . COPD (chronic obstructive pulmonary disease) ([place]) 12/27/2012  . Diabetic polyneuropathy associated with type 2 diabetes mellitus ([place]) 02/10/2012  . PYELITIS/PYELONEPHRITIS DISEASES CLASSIFIED ELSW 09/28/2009  . URINARY CALCULUS 09/28/2009  . Adjustment disorder with depressed mood  08/21/2009  . Poorly controlled type II diabetes mellitus with renal complication ([place]) 60/10/9321  . Hyperlipidemia 04/14/2008  . Essential hypertension 04/14/2008  . ALLERGIC RHINITIS 04/14/2008   Past Medical History:  Diagnosis Date  . 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 Allergic rhinitis   . Anemia   . Arthritis   . Chronic systolic heart failure ([place])    a. ECHO (12/2012) EF 25-30%, HK entireanteroseptal myocardium //  b.  EF 25%, diffuse HK, grade 1 diastolic dysfunction, MAC, mild LAE, normal RVSF, trivial pericardial effusion  . COPD (chronic obstructive pulmonary disease) ([place])   . Diabetes mellitus type II   . Diabetic nephropathy ([place])   . Diabetic neuropathy ([place])   . ESRD on hemodialysis [facility])    started HD June 2017, goes to [facility] HD unit, Dr Hinda Lenis  . History of cardiac catheterization    a.Myoview 1/15:  There is significant left ventricular dysfunction. There may be slight scar at the apex. There is no significant ischemia. LV Ejection Fraction: 27%  //  b. RHC/LHC (1/15) with mean RA 6, PA 47/22 mean 33, mean PCWP 20, PVR 2.5 WU, CI 2.5; 80% dLAD stenosis, 70% diffuse large D.   . History of kidney stones   . Hyperlipidemia   . Hypertension   . Kidney stones   . NICM (nonischemic cardiomyopathy) ([place])    Primarily nonischemic. Echo (12/14) with EF 25-30%. Echo (3/15) with EF 25%, mild to moderately dilated LV, normal RV size and systolic function.   . Pneumonia   . Urethral stricture     Family History  Problem Relation Age of Onset  . Bladder Cancer Mother   . Alcohol abuse Father   . Melanoma Father   . Stroke Maternal Grandmother   . Heart Problems Maternal Grandmother        unknown  . Diabetes Maternal Grandmother   . Heart disease Maternal Grandfather   . Prostate cancer Maternal Grandfather     Past Surgical History:  Procedure Laterality Date  . BELOW KNEE LEG AMPUTATION Right 04/26/2016  .  CARDIAC CATHETERIZATION    . CARDIAC DEFIBRILLATOR PLACEMENT  06/27/2013   Sub Q       BY DR Caryl Comes  . HERNIA REPAIR    . IR GENERIC HISTORICAL Right 11/30/2015   IR THROMBECTOMY AV FISTULA W/THROMBOLYSIS/PTA INC/SHUNT/IMG RIGHT 11/30/2015 Aletta Edouard, MD [facility]  . IR GENERIC HISTORICAL  11/30/2015   IR US GUIDE VASC ACCESS RIGHT 11/30/2015 Aletta Edouard, MD [facility]  . IR GENERIC HISTORICAL Right 12/15/2015   IR THROMBECTOMY AV FISTULA W/THROMBOLYSIS/PTA/STENT INC/SHUNT/IMG RT 12/15/2015 Arne Cleveland, MD [facility]  . IR GENERIC HISTORICAL  12/15/2015   IR US GUIDE VASC ACCESS RIGHT 12/15/2015 Arne Cleveland, MD [facility]  . IR GENERIC HISTORICAL  12/28/2015   IR FLUORO GUIDE CV LINE RIGHT 12/28/2015 Marybelle Killings, MD [facility]  . IR GENERIC HISTORICAL  12/28/2015   IR US GUIDE VASC ACCESS RIGHT 12/28/2015 Marybelle Killings, MD [facility]  . LEFT  A ND RIGHT HEART CATH  01/30/2013   DR Sung Amabile  . reapea urethral surgery for recurrent obstruction  2011  . TOTAL KNEE ARTHROPLASTY Right 2007   Social History   Occupational History  . Not on file  Tobacco Use  . Smoking status: Former Smoker    Packs/day: 2.00    Years: 32.00    Pack years: 64.00    Types: Cigarettes    Last attempt to quit: 05/11/2010    Years since quitting: 6.5  . Smokeless tobacco: Never Used  Substance and Sexual Activity  . Alcohol use: No  . Drug use: No  . Sexual activity: Yes

## 2016-12-05 ENCOUNTER — Other Ambulatory Visit: Payer: Self-pay | Admitting: Family Medicine

## 2016-12-05 ENCOUNTER — Other Ambulatory Visit: Payer: Self-pay | Admitting: Cardiology

## 2016-12-12 ENCOUNTER — Ambulatory Visit (INDEPENDENT_AMBULATORY_CARE_PROVIDER_SITE_OTHER): Payer: BLUE CROSS/BLUE SHIELD | Admitting: Internal Medicine

## 2016-12-12 ENCOUNTER — Encounter: Payer: Self-pay | Admitting: Internal Medicine

## 2016-12-12 VITALS — BP 200/90 | HR 79 | Ht 70.0 in | Wt 209.2 lb

## 2016-12-12 DIAGNOSIS — I5022 Chronic systolic (congestive) heart failure: Secondary | ICD-10-CM

## 2016-12-12 DIAGNOSIS — I428 Other cardiomyopathies: Secondary | ICD-10-CM | POA: Diagnosis not present

## 2016-12-12 DIAGNOSIS — Z4502 Encounter for adjustment and management of automatic implantable cardiac defibrillator: Secondary | ICD-10-CM | POA: Diagnosis not present

## 2016-12-12 NOTE — Patient Instructions (Addendum)
Medication Instructions:  Your physician recommends that you continue on your current medications as directed. Please refer to the Current Medication list given to you today.  * If you need a refill on your cardiac medications before your next appointment, please call your pharmacy. *  Labwork: None ordered  Testing/Procedures: None ordered  Follow-Up: Your physician recommends that you schedule a follow-up appointment in: 3 months with device clinic.  Your physician wants you to follow-up in: 1 year with Dr. Caryl Comes. You will receive a reminder letter in the mail two months in advance. If you don't receive a letter, please call our office to schedule the follow-up appointment.  Thank you for choosing [facility]!!

## 2016-12-12 NOTE — Progress Notes (Signed)
Patient Care Team: Arnoldo Lenis, MD as PCP - Cardiology (Cardiology) Deboraha Sprang, MD as Consulting Physician (Cardiology) Fran Lowes, MD as Consulting Physician (Nephrology) Harl Bowie Alphonse Guild, MD as Consulting Physician (Cardiology) [place], [facility], Revonda Standard, MD as Consulting Physician (Cardiology)   HPI  Anthony Bullock is a 56 y.o. male Seen in follow-up for ICD implantation-subcutaneous. (6/15) He is here today because he was called from our office saying that his ICD had fired.  He has a history of nonischemic cardiomyopathy and ESRD   The patient denies chest pain, shortness of breath, nocturnal dyspnea, orthopnea   He has significant hypotension with dialysis.     Past Medical History:  Diagnosis Date  . 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 Allergic rhinitis   . Anemia   . Arthritis   . Chronic systolic heart failure ([place])    a. ECHO (12/2012) EF 25-30%, HK entireanteroseptal myocardium //  b.  EF 25%, diffuse HK, grade 1 diastolic dysfunction, MAC, mild LAE, normal RVSF, trivial pericardial effusion  . COPD (chronic obstructive pulmonary disease) ([place])   . Diabetes mellitus type II   . Diabetic nephropathy ([place])   . Diabetic neuropathy ([place])   . ESRD on hemodialysis [facility])    started HD June 2017, goes to [facility] HD unit, Dr Hinda Lenis  . History of cardiac catheterization    a.Myoview 1/15:  There is significant left ventricular dysfunction. There may be slight scar at the apex. There is no significant ischemia. LV Ejection Fraction: 27%  //  b. RHC/LHC (1/15) with mean RA 6, PA 47/22 mean 33, mean PCWP 20, PVR 2.5 WU, CI 2.5; 80% dLAD stenosis, 70% diffuse large D.   . History of kidney stones   . Hyperlipidemia   . Hypertension   . Kidney stones   . NICM (nonischemic cardiomyopathy) ([place])    Primarily nonischemic. Echo (12/14) with EF 25-30%. Echo (3/15) with EF  25%, mild to moderately dilated LV, normal RV size and systolic function.   . Pneumonia   . Urethral stricture     Past Surgical History:  Procedure Laterality Date  . ABDOMINAL AORTOGRAM W/LOWER EXTREMITY N/A 03/30/2016   Procedure: Abdominal Aortogram w/Lower Extremity;  Surgeon: Angelia Mould, MD;  Location: [place] [facility];  Service: Cardiovascular;  Laterality: N/A;  . AMPUTATION Right 04/26/2016   Procedure: Right Below Knee Amputation;  Surgeon: Newt Minion, MD;  Location: [place];  Service: Orthopedics;  Laterality: Right;  . AV FISTULA PLACEMENT Right 09/08/2015   Procedure: INSERTION OF 4-63m x 45cm  ARTERIOVENOUS (AV) GORE-TEX GRAFT RIGHT UPPER  ARM;  Surgeon: CAngelia Mould MD;  [place]  Service: Vascular;  Laterality: Right;  . AV FISTULA PLACEMENT Left 01/14/2016   Procedure: CREATION OF LEFT UPPER ARM ARTERIOVENOUS FISTULA;  Surgeon: CAngelia Mould MD;  [place]  Service: Vascular;  Laterality: Left;  . BASCILIC VEIN TRANSPOSITION Right 08/22/2014   Procedure: RIGHT UPPER ARM BASCILIC VEIN TRANSPOSITION;  Surgeon: CAngelia Mould MD;  [place]  Service: Vascular;  Laterality: Right;  . BELOW KNEE LEG AMPUTATION Right 04/26/2016  . CARDIAC CATHETERIZATION    . CARDIAC DEFIBRILLATOR PLACEMENT  06/27/2013   Sub Q       BY DR KCaryl Comes . COLONOSCOPY WITH PROPOFOL N/A 07/22/2015   Procedure: COLONOSCOPY WITH PROPOFOL;  Surgeon: HDoran Stabler MD;  Location: [facility];  Service: Gastroenterology;  Laterality: N/A;  . FEMORAL-POPLITEAL BYPASS GRAFT Right 03/31/2016   Procedure: BYPASS GRAFT FEMORAL-POPLITEAL ARTERY USING RIGHT GREATER SAPHENOUS NONREVERSED VEIN;  Surgeon: Angelia Mould, MD;  Location: [place];  Service: Vascular;  Laterality: Right;  . HERNIA REPAIR    . I&D EXTREMITY Right 03/31/2016   Procedure: IRRIGATION AND DEBRIDEMENT FOOT;  Surgeon: Angelia Mould, MD;  Location: [place];  Service: Vascular;   Laterality: Right;  . IMPLANTABLE CARDIOVERTER DEFIBRILLATOR IMPLANT N/A 06/27/2013   Procedure: SUB Q ICD;  Surgeon: Deboraha Sprang, MD;  Location: [facility] CATH LAB;  Service: Cardiovascular;  Laterality: N/A;  . INTRAOPERATIVE ARTERIOGRAM Right 03/31/2016   Procedure: INTRA OPERATIVE ARTERIOGRAM;  Surgeon: Angelia Mould, MD;  Location: [place];  Service: Vascular;  Laterality: Right;  . IR GENERIC HISTORICAL Right 11/30/2015   IR THROMBECTOMY AV FISTULA W/THROMBOLYSIS/PTA INC/SHUNT/IMG RIGHT 11/30/2015 Aletta Edouard, MD [facility]  . IR GENERIC HISTORICAL  11/30/2015   IR US GUIDE VASC ACCESS RIGHT 11/30/2015 Aletta Edouard, MD [facility]  . IR GENERIC HISTORICAL Right 12/15/2015   IR THROMBECTOMY AV FISTULA W/THROMBOLYSIS/PTA/STENT INC/SHUNT/IMG RT 12/15/2015 Arne Cleveland, MD [facility]  . IR GENERIC HISTORICAL  12/15/2015   IR US GUIDE VASC ACCESS RIGHT 12/15/2015 Arne Cleveland, MD [facility]  . IR GENERIC HISTORICAL  12/28/2015   IR FLUORO GUIDE CV LINE RIGHT 12/28/2015 Marybelle Killings, MD [facility]  . IR GENERIC HISTORICAL  12/28/2015   IR US GUIDE VASC ACCESS RIGHT 12/28/2015 Marybelle Killings, MD [facility]  . LEFT A ND RIGHT HEART CATH  01/30/2013   DR Sung Amabile  . LEFT AND RIGHT HEART CATHETERIZATION WITH CORONARY ANGIOGRAM N/A 01/30/2013   Procedure: LEFT AND RIGHT HEART CATHETERIZATION WITH CORONARY ANGIOGRAM;  Surgeon: Jolaine Artist, MD;  Location: [facility] CATH LAB;  Service: Cardiovascular;  Laterality: N/A;  . PERIPHERAL VASCULAR CATHETERIZATION Right 01/26/2015   Procedure: A/V Fistulagram;  Surgeon: Angelia Mould, MD;  Location: [place] [facility];  Service: Cardiovascular;  Laterality: Right;  . reapea urethral surgery for recurrent obstruction  2011  . TOTAL KNEE ARTHROPLASTY Right 2007  . VEIN HARVEST Right 03/31/2016   Procedure: RIGHT GREATER SAPHENOUS VEIN HARVEST;  Surgeon: Angelia Mould, MD;  Location: [facility] OR;  Service: Vascular;  Laterality:  Right;    Current Outpatient Medications  Medication Sig Dispense Refill  . aspirin EC 81 MG tablet Take 81 mg by mouth daily.     [name] atorvastatin (LIPITOR) 80 MG tablet Take 1 tablet (80 mg total) by mouth at bedtime. 90 tablet 1  . azelastine (ASTELIN) 0.1 % nasal spray Place 2 sprays into both nostrils 3 (three) times daily as needed for rhinitis. Use in each nostril as directed 30 mL 12  . BD PEN NEEDLE NANO U/F 32G X 4 MM MISC USE ONCE DAILY 90 each 2  . carvedilol (COREG) 12.5 MG tablet TAKE 1 TABLET (12.5 MG TOTAL) BY MOUTH 2 (TWO) TIMES DAILY WITH A MEAL. 180 tablet 1  . fenofibrate 160 MG tablet TAKE 1 TABLET BY MOUTH EVERY DAY 90 tablet 1  . fexofenadine (ALLEGRA) 180 MG tablet Take 180 mg by mouth daily.    . furosemide (LASIX) 40 MG tablet daily. Take 3 tablets (120 mg) by mouth daily    . gabapentin (NEURONTIN) 300 MG capsule TAKE 1 CAPSULE (300 MG TOTAL) BY MOUTH 3 (THREE) TIMES DAILY. 270 capsule 2  . glucose blood test strip Check 1  time daily. E11.9 One Touch Ultra Blue Test Strips 100 each 3  . Insulin Glargine (BASAGLAR KWIKPEN) 100 UNIT/ML SOPN Inject 44 Units into the skin at bedtime.    . insulin lispro (HUMALOG) 100 UNIT/ML injection Inject 0-14 Units into the skin 3 (three) times daily before meals. Dose per sliding scale    . lanthanum (FOSRENOL) 1000 MG chewable tablet Chew 1,000 mg by mouth 3 (three) times daily with meals. Take one tablet after each meal.     . multivitamin (RENA-VIT) TABS tablet Take 1 tablet by mouth daily.  99  . nystatin cream (MYCOSTATIN) Apply 1 application topically 2 (two) times daily. 30 g 2  . Olopatadine HCl (PATADAY) 0.2 % SOLN Place 1 drop into both eyes daily as needed (for allergies).      No current facility-administered medications for this visit.     Allergies  Allergen Reactions  . Morphine Sulfate Rash and Other (See Comments)    Itches all over, red spots    Review of Systems negative except from HPI and PMH  Physical  Exam BP (!) 200/90   Pulse 79   Ht _0  (1.778 m)   Wt 209 lb 4 oz (94.9 kg)   BMI 30.02 kg/m  Well developed and nourished in no acute distress HENT normal Neck supple with JVP-flat Clear Device pocket well healed; without hematoma or erythema.  There is no tethering  Regular rate and rhythm, 2/6 M Abd-soft with active BS No Clubbing cyanosis edema Skin-warm and dry A & Oriented  Grossly normal sensory and motor function   ECG demonstrates sinus rhythm at 79 Interval 16/10/39 Nonspecific ST-T changes  Assessment and  Plan  ICD-subcutaneous  Nonischemic cardiomyopathy  HFrEF  Renal insufficiency  Cr  6.3<<5.25 << 4.5 4/16  Euvolemic continue current meds  Device function normal   His blood pressure was sky high today.  I asked him to review these data with his nephrologist tomorrow.  I suspect that he needs more interval antihypertensive therapy that he is getting.

## 2016-12-20 ENCOUNTER — Telehealth: Payer: Self-pay | Admitting: Family Medicine

## 2016-12-20 NOTE — Telephone Encounter (Signed)
Copied from [place] [id number]. Topic: Quick Communication - Rx Refill/Question >> Dec 20, 2016  1:14 PM Lennox Solders wrote: Has the patient contacted their pharmacy? NO (Agent: If no, request that the patient contact the pharmacy for the refill.) Pt was given samples of basaglar kwikpen . Pt now needs new rx basaglar   Preferred Pharmacy (with phone number or street name): [facility] [phone]  Agent: Please be advised that RX refills may take up to 3 business days. We ask that you follow-up with your pharmacy.

## 2016-12-21 NOTE — Telephone Encounter (Signed)
Last OV 11/14/16. Pt asking for RX for [employer]. Pt was given samples previously.

## 2016-12-22 NOTE — Telephone Encounter (Signed)
Left detailed message on machine for patient samples available for pick up.

## 2017-01-25 ENCOUNTER — Ambulatory Visit (INDEPENDENT_AMBULATORY_CARE_PROVIDER_SITE_OTHER): Payer: BLUE CROSS/BLUE SHIELD | Admitting: Family Medicine

## 2017-01-25 ENCOUNTER — Encounter: Payer: Self-pay | Admitting: Family Medicine

## 2017-01-25 VITALS — BP 130/80 | HR 87 | Temp 98.4°F

## 2017-01-25 DIAGNOSIS — E1165 Type 2 diabetes mellitus with hyperglycemia: Secondary | ICD-10-CM

## 2017-01-25 DIAGNOSIS — N451 Epididymitis: Secondary | ICD-10-CM

## 2017-01-25 DIAGNOSIS — L723 Sebaceous cyst: Secondary | ICD-10-CM

## 2017-01-25 DIAGNOSIS — E785 Hyperlipidemia, unspecified: Secondary | ICD-10-CM

## 2017-01-25 DIAGNOSIS — Z1159 Encounter for screening for other viral diseases: Secondary | ICD-10-CM

## 2017-01-25 DIAGNOSIS — E1129 Type 2 diabetes mellitus with other diabetic kidney complication: Secondary | ICD-10-CM | POA: Diagnosis not present

## 2017-01-25 LAB — LIPID PANEL
Cholesterol: 103 mg/dL (ref 0–200)
HDL: 38.1 mg/dL — ABNORMAL LOW (ref >39.00)
LDL Cholesterol: 38 mg/dL (ref 0–99)
NonHDL: 65.06
Total CHOL/HDL Ratio: 3
Triglycerides: 136 mg/dL (ref 0.0–149.0)
VLDL: 27.2 mg/dL (ref 0.0–40.0)

## 2017-01-25 LAB — HEPATIC FUNCTION PANEL
ALT: 12 U/L (ref 0–53)
AST: 20 U/L (ref 0–37)
Albumin: 3.2 g/dL — ABNORMAL LOW (ref 3.5–5.2)
Alkaline Phosphatase: 103 U/L (ref 39–117)
Bilirubin, Direct: 0.1 mg/dL (ref 0.0–0.3)
Total Bilirubin: 0.5 mg/dL (ref 0.2–1.2)
Total Protein: 6.3 g/dL (ref 6.0–8.3)

## 2017-01-25 LAB — HEMOGLOBIN A1C: Hgb A1c MFr Bld: 11 % — ABNORMAL HIGH (ref 4.6–6.5)

## 2017-01-25 MED ORDER — INSULIN LISPRO 100 UNIT/ML ~~LOC~~ SOLN: 0.0000 [IU] | Freq: Three times a day (TID) | SUBCUTANEOUS | 5 refills | Status: DC

## 2017-01-25 MED ORDER — ATORVASTATIN CALCIUM 80 MG PO TABS: 80.0000 mg | ORAL_TABLET | Freq: Every day | ORAL | 3 refills | Status: DC

## 2017-01-25 MED ORDER — FENOFIBRATE 160 MG PO TABS: 160.0000 mg | ORAL_TABLET | Freq: Every day | ORAL | 3 refills | Status: DC

## 2017-01-25 MED ORDER — BASAGLAR KWIKPEN 100 UNIT/ML ~~LOC~~ SOPN: 44.0000 [IU] | PEN_INJECTOR | Freq: Every day | SUBCUTANEOUS | 3 refills | Status: DC

## 2017-01-25 MED ORDER — GABAPENTIN 300 MG PO CAPS: 300.0000 mg | ORAL_CAPSULE | Freq: Three times a day (TID) | ORAL | 3 refills | Status: DC

## 2017-01-25 MED ORDER — CARVEDILOL 12.5 MG PO TABS: 12.5000 mg | ORAL_TABLET | Freq: Two times a day (BID) | ORAL | 3 refills | Status: DC

## 2017-01-25 MED ORDER — FUROSEMIDE 40 MG PO TABS: 120.0000 mg | ORAL_TABLET | Freq: Every day | ORAL | 3 refills | Status: DC

## 2017-01-25 NOTE — Patient Instructions (Addendum)
Epididymitis Epididymitis is swelling (inflammation) of the epididymis. The epididymis is a cord-like structure that is located along the top and back part of the testicle. It collects and stores sperm from the testicle. This condition can also cause pain and swelling of the testicle and scrotum. Symptoms usually start suddenly (acute epididymitis). Sometimes epididymitis starts gradually and lasts for a while (chronic epididymitis). This type may be harder to treat. What are the causes? In men 35 and younger, this condition is usually caused by a bacterial infection or sexually transmitted disease (STD), such as:  Gonorrhea.  Chlamydia.  In men 35 and older who do not have anal sex, this condition is usually caused by bacteria from a blockage or abnormalities in the urinary system. These can result from:  Having a tube placed into the bladder (urinary catheter).  Having an enlarged or inflamed prostate gland.  Having recent urinary tract surgery.  In men who have a condition that weakens the body's defense system (immune system), such as HIV, this condition can be caused by:  Other bacteria, including tuberculosis and syphilis.  Viruses.  Fungi.  Sometimes this condition occurs without infection. That may happen if urine flows backward into the epididymis after heavy lifting or straining. What increases the risk? This condition is more likely to develop in men:  Who have unprotected sex with more than one partner.  Who have anal sex.  Who have recently had surgery.  Who have a urinary catheter.  Who have urinary problems.  Who have a suppressed immune system.  What are the signs or symptoms? This condition usually begins suddenly with chills, fever, and pain behind the scrotum and in the testicle. Other symptoms include:  Swelling of the scrotum, testicle, or both.  Pain whenejaculatingor urinating.  Pain in the back or belly.  Nausea.  Itching and discharge  from the penis.  Frequent need to pass urine.  Redness and tenderness of the scrotum.  How is this diagnosed? Your health care provider can diagnose this condition based on your symptoms and medical history. Your health care provider will also do a physical exam to ask about your symptoms and check your scrotum and testicle for swelling, pain, and redness. You may also have other tests, including:  Examination of discharge from the penis.  Urine tests for infections, such as STDs.  Your health care provider may test you for other STDs, including HIV. How is this treated? Treatment for this condition depends on the cause. If your condition is caused by a bacterial infection, oral antibiotic medicine may be prescribed. If the bacterial infection has spread to your blood, you may need to receive IV antibiotics. Nonbacterial epididymitis is treated with home care that includes bed rest and elevation of the scrotum. Surgery may be needed to treat:  Bacterial epididymitis that causes pus to build up in the scrotum (abscess).  Chronic epididymitis that has not responded to other treatments.  Follow these instructions at home: Medicines  Take over-the-counter and prescription medicines only as told by your health care provider.  If you were prescribed an antibiotic medicine, take it as told by your health care provider. Do not stop taking the antibiotic even if your condition improves. Sexual Activity  If your epididymitis was caused by an STD, avoid sexual activity until your treatment is complete.  Inform your sexual partner or partners if you test positive for an STD. They may need to be treated.Do not engage in sexual activity with your partner or   partners until their treatment is completed. General instructions  Return to your normal activities as told by your health care provider. Ask your health care provider what activities are safe for you.  Keep your scrotum elevated and  supported while resting. Ask your health care provider if you should wear a scrotal support, such as a jockstrap. Wear it as told by your health care provider.  If directed, apply ice to the affected area: ? Put ice in a plastic bag. ? Place a towel between your skin and the bag. ? Leave the ice on for 20 minutes, 2-3 times per day.  Try taking a sitz bath to help with discomfort. This is a warm water bath that is taken while you are sitting down. The water should only come up to your hips and should cover your buttocks. Do this 3-4 times per day or as told by your health care provider.  Keep all follow-up visits as told by your health care provider. This is important. Contact a health care provider if:  You have a fever.  Your pain medicine is not helping.  Your pain is getting worse.  Your symptoms do not improve within three days. This information is not intended to replace advice given to you by your health care provider. Make sure you discuss any questions you have with your health care provider. Document Released: 12/25/1999 Document Revised: 06/04/2015 Document Reviewed: 05/14/2014 Elsevier Interactive Patient Education  2018 [place] up 30 minute follow up for cyst excision.

## 2017-01-25 NOTE — Progress Notes (Signed)
Samples of this drug were given to the patient, quantity 2  Pen basaglar, Lot Number [id number] DA Samples of this drug were given to the patient, quantity 2 pen Humalog 100 , Lot Number [id number]

## 2017-01-25 NOTE — Progress Notes (Signed)
Subjective:     Patient ID: Anthony Bullock, male   DOB: 23-Oct-1960, 57 y.o.   MRN: [id number]  HPI Patient seen today for multiple issues as follows:  Patient noted right before the new year acute left testicle swelling. No injury. Went to ER and had ultrasound. No torsion. Was diagnosed with epididymoorchitis. He was treated with Levaquin. He is some better but not completely improved. Still has mild soreness. No fevers or chills. Overall improving  Patient had sebaceous cyst excised right scalp several months ago. This was very scarred and difficult to remove. He now has some regrowth. He has not had any pain. No drainage. He was to get this fully excised.  Multiple chronic problems including peripheral vascular disease, nonischemic cardiomyopathy, hypertension, COPD, poorly controlled type 2 diabetes, diabetic polyneuropathy, history of poor compliance, history of kidney stones, history of right below-knee amputation, dyslipidemia, chronic kidney disease on dialysis  He needs refills of several medications today including carvedilol, gabapentin, furosemide, fenofibrate, Lipitor. Also needs refills of his insulins. Currently on Basaglar 50 units once daily and Humalog 14 units with meals  Past Medical History:  Diagnosis Date  . 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 Allergic rhinitis   . Anemia   . Arthritis   . Chronic systolic heart failure ([place])    a. ECHO (12/2012) EF 25-30%, HK entireanteroseptal myocardium //  b.  EF 25%, diffuse HK, grade 1 diastolic dysfunction, MAC, mild LAE, normal RVSF, trivial pericardial effusion  . COPD (chronic obstructive pulmonary disease) ([place])   . Diabetes mellitus type II   . Diabetic nephropathy ([place])   . Diabetic neuropathy ([place])   . ESRD on hemodialysis [facility])    started HD June 2017, goes to [facility] HD unit, Dr Hinda Lenis  . History of cardiac catheterization    a.Myoview 1/15:  There is significant left  ventricular dysfunction. There may be slight scar at the apex. There is no significant ischemia. LV Ejection Fraction: 27%  //  b. RHC/LHC (1/15) with mean RA 6, PA 47/22 mean 33, mean PCWP 20, PVR 2.5 WU, CI 2.5; 80% dLAD stenosis, 70% diffuse large D.   . History of kidney stones   . Hyperlipidemia   . Hypertension   . Kidney stones   . NICM (nonischemic cardiomyopathy) ([place])    Primarily nonischemic. Echo (12/14) with EF 25-30%. Echo (3/15) with EF 25%, mild to moderately dilated LV, normal RV size and systolic function.   . Pneumonia   . Urethral stricture    Past Surgical History:  Procedure Laterality Date  . ABDOMINAL AORTOGRAM W/LOWER EXTREMITY N/A 03/30/2016   Procedure: Abdominal Aortogram w/Lower Extremity;  Surgeon: Angelia Mould, MD;  Location: [place] CV LAB;  Service: Cardiovascular;  Laterality: N/A;  . AMPUTATION Right 04/26/2016   Procedure: Right Below Knee Amputation;  Surgeon: Newt Minion, MD;  Location: [place];  Service: Orthopedics;  Laterality: Right;  . AV FISTULA PLACEMENT Right 09/08/2015   Procedure: INSERTION OF 4-15m x 45cm  ARTERIOVENOUS (AV) GORE-TEX GRAFT RIGHT UPPER  ARM;  Surgeon: CAngelia Mould MD;  [place]  Service: Vascular;  Laterality: Right;  . AV FISTULA PLACEMENT Left 01/14/2016   Procedure: CREATION OF LEFT UPPER ARM ARTERIOVENOUS FISTULA;  Surgeon: CAngelia Mould MD;  [place]  Service: Vascular;  Laterality: Left;  . BASCILIC VEIN TRANSPOSITION Right 08/22/2014   Procedure: RIGHT UPPER ARM BASCILIC VEIN TRANSPOSITION;  Surgeon: CAngelia Mould  MD;  Location: [place];  Service: Vascular;  Laterality: Right;  . BELOW KNEE LEG AMPUTATION Right 04/26/2016  . CARDIAC CATHETERIZATION    . CARDIAC DEFIBRILLATOR PLACEMENT  06/27/2013   Sub Q       BY DR Caryl Comes  . COLONOSCOPY WITH PROPOFOL N/A 07/22/2015   Procedure: COLONOSCOPY WITH PROPOFOL;  Surgeon: Doran Stabler, MD;  [facility];   Service: Gastroenterology;  Laterality: N/A;  . FEMORAL-POPLITEAL BYPASS GRAFT Right 03/31/2016   Procedure: BYPASS GRAFT FEMORAL-POPLITEAL ARTERY USING RIGHT GREATER SAPHENOUS NONREVERSED VEIN;  Surgeon: Angelia Mould, MD;  Location: [place];  Service: Vascular;  Laterality: Right;  . HERNIA REPAIR    . I&D EXTREMITY Right 03/31/2016   Procedure: IRRIGATION AND DEBRIDEMENT FOOT;  Surgeon: Angelia Mould, MD;  Location: [place];  Service: Vascular;  Laterality: Right;  . IMPLANTABLE CARDIOVERTER DEFIBRILLATOR IMPLANT N/A 06/27/2013   Procedure: SUB Q ICD;  Surgeon: Deboraha Sprang, MD;  Location: [facility] CATH LAB;  Service: Cardiovascular;  Laterality: N/A;  . INTRAOPERATIVE ARTERIOGRAM Right 03/31/2016   Procedure: INTRA OPERATIVE ARTERIOGRAM;  Surgeon: Angelia Mould, MD;  Location: [place];  Service: Vascular;  Laterality: Right;  . IR GENERIC HISTORICAL Right 11/30/2015   IR THROMBECTOMY AV FISTULA W/THROMBOLYSIS/PTA INC/SHUNT/IMG RIGHT 11/30/2015 Aletta Edouard, MD [facility]  . IR GENERIC HISTORICAL  11/30/2015   IR US GUIDE VASC ACCESS RIGHT 11/30/2015 Aletta Edouard, MD [facility]  . IR GENERIC HISTORICAL Right 12/15/2015   IR THROMBECTOMY AV FISTULA W/THROMBOLYSIS/PTA/STENT INC/SHUNT/IMG RT 12/15/2015 Arne Cleveland, MD [facility]  . IR GENERIC HISTORICAL  12/15/2015   IR US GUIDE VASC ACCESS RIGHT 12/15/2015 Arne Cleveland, MD [facility]  . IR GENERIC HISTORICAL  12/28/2015   IR FLUORO GUIDE CV LINE RIGHT 12/28/2015 Marybelle Killings, MD [facility]  . IR GENERIC HISTORICAL  12/28/2015   IR US GUIDE VASC ACCESS RIGHT 12/28/2015 Marybelle Killings, MD [facility]  . LEFT A ND RIGHT HEART CATH  01/30/2013   DR Sung Amabile  . LEFT AND RIGHT HEART CATHETERIZATION WITH CORONARY ANGIOGRAM N/A 01/30/2013   Procedure: LEFT AND RIGHT HEART CATHETERIZATION WITH CORONARY ANGIOGRAM;  Surgeon: Jolaine Artist, MD;  Location: [facility] CATH LAB;  Service: Cardiovascular;  Laterality: N/A;  .  PERIPHERAL VASCULAR CATHETERIZATION Right 01/26/2015   Procedure: A/V Fistulagram;  Surgeon: Angelia Mould, MD;  Location: [place] [facility];  Service: Cardiovascular;  Laterality: Right;  . reapea urethral surgery for recurrent obstruction  2011  . TOTAL KNEE ARTHROPLASTY Right 2007  . VEIN HARVEST Right 03/31/2016   Procedure: RIGHT GREATER SAPHENOUS VEIN HARVEST;  Surgeon: Angelia Mould, MD;  Location: [place];  Service: Vascular;  Laterality: Right;    reports that he quit smoking about 6 years ago. His smoking use included cigarettes. He has a 64.00 pack-year smoking history. he has never used smokeless tobacco. He reports that he does not drink alcohol or use drugs. family history includes Alcohol abuse in his father; Bladder Cancer in his mother; Diabetes in his maternal grandmother; Heart Problems in his maternal grandmother; Heart disease in his maternal grandfather; Melanoma in his father; Prostate cancer in his maternal grandfather; Stroke in his maternal grandmother. Allergies  Allergen Reactions  . Morphine Sulfate Rash and Other (See Comments)    Itches all over, red spots     Review of Systems  Constitutional: Negative for chills, fatigue, fever and unexpected weight change.  Eyes: Negative for visual disturbance.  Respiratory: Negative  for cough, chest tightness and shortness of breath.   Cardiovascular: Negative for chest pain, palpitations and leg swelling.  Endocrine: Negative for polydipsia and polyuria.  Genitourinary: Positive for scrotal swelling and testicular pain. Negative for dysuria and hematuria.  Neurological: Negative for dizziness, syncope, weakness, light-headedness and headaches.       Objective:   Physical Exam  Constitutional: He is oriented to person, place, and time. He appears well-developed and well-nourished.  HENT:  Right Ear: External ear normal.  Left Ear: External ear normal.  Mouth/Throat: Oropharynx is clear and moist.   Eyes: Pupils are equal, round, and reactive to light.  Neck: Neck supple. No thyromegaly present.  Cardiovascular: Normal rate and regular rhythm.  Pulmonary/Chest: Effort normal and breath sounds normal. No respiratory distress. He has no wheezes. He has no rales.  Genitourinary:  Genitourinary Comments: He has some mild enlargement left testicle compared to the right with minimal tenderness. He has some prominence and tenderness of epididymis gland. No hernia  Musculoskeletal: He exhibits no edema.  Neurological: He is alert and oriented to person, place, and time.  Skin:  Small approximately 1 cm mobile cyst right parietal scalp. No erythema. No fluctuance. Nontender.       Assessment:     #1 recent left epididymoorchitis improving following Levaquin  #2 recurrent sebaceous cyst right scalp noninfected  #3 type 2 diabetes poorly controlled with history of poor compliance  # 4 chronic kidney disease on dialysis and followed closely by nephrology  #5 dyslipidemia  #6 history of diabetic polyneuropathy  #7 history of nonischemic cardiomyopathy     Plan:     -Check labs with A1c, lipid panel, hepatic panel. We did not check chemistries since these are followed by nephrology -Consider warm sitz baths for his left epididymoorchitis and touch base for any recurrent pain or worsening swelling. Explained this may take several weeks to fully resolve in size -Refill all of his regular medications for one year -Provided samples of basaglar and Humalog. Suggested that he slowly titrate Basiglar up two units every 3 days until fastings consistently around 130 or less -He will schedule for 30 minute follow-up to re-excise cyst right parietal scalp  Anthony Post MD [facility] at [facility]

## 2017-01-26 LAB — HEPATITIS C ANTIBODY
Hepatitis C Ab: NONREACTIVE
SIGNAL TO CUT-OFF: 0.05 (ref <1.00)

## 2017-01-31 LAB — CUP PACEART INCLINIC DEVICE CHECK
Date Time Interrogation Session: 20190122100551
Implantable Pulse Generator Implant Date: 20150618
Lead Channel Setting Pacing Amplitude: 2 V
Lead Channel Setting Pacing Amplitude: 2.4 V
Lead Channel Setting Pacing Pulse Width: 0.7 ms
Lead Channel Setting Sensing Sensitivity: 2.5 mV
Pulse Gen Model: 1010
Pulse Gen Serial Number: 111255

## 2017-02-01 ENCOUNTER — Ambulatory Visit (INDEPENDENT_AMBULATORY_CARE_PROVIDER_SITE_OTHER): Payer: BLUE CROSS/BLUE SHIELD | Admitting: Family Medicine

## 2017-02-01 ENCOUNTER — Encounter: Payer: Self-pay | Admitting: Family Medicine

## 2017-02-01 VITALS — BP 170/90 | HR 84 | Temp 98.3°F | Wt 210.7 lb

## 2017-02-01 DIAGNOSIS — L723 Sebaceous cyst: Secondary | ICD-10-CM | POA: Diagnosis not present

## 2017-02-01 DIAGNOSIS — N453 Epididymo-orchitis: Secondary | ICD-10-CM | POA: Diagnosis not present

## 2017-02-01 MED ORDER — LEVOFLOXACIN 500 MG PO TABS: ORAL_TABLET | ORAL | 0 refills | Status: DC

## 2017-02-01 NOTE — Patient Instructions (Signed)
Keep scalp dry for one day then clean with soap and water Apply topical antibiotic daily for 4-5 days Follow up in one week for suture removal.

## 2017-02-01 NOTE — Progress Notes (Signed)
Subjective:     Patient ID: Anthony Bullock, male   DOB: [birth year], 57 y.o.   MRN: [id number]  HPI Patient seen for cyst reexcision right side of scalp. He was seen here last November and had very scarred cyst and we tried to remove this piecemeal. He's had some regrowth but much smaller. No pain. He states this is aggravated when he is combing his hair.  Recent epididymoorchitis. He had some improvement with Levaquin but not greatly improved. Still has some swelling and mild pain. Requesting one more go round of antibiotics. No dysuria. No fever.  Past Medical History:  Diagnosis Date  . 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 Allergic rhinitis   . Anemia   . Arthritis   . Chronic systolic heart failure ([place])    a. ECHO (12/2012) EF 25-30%, HK entireanteroseptal myocardium //  b.  EF 25%, diffuse HK, grade 1 diastolic dysfunction, MAC, mild LAE, normal RVSF, trivial pericardial effusion  . COPD (chronic obstructive pulmonary disease) ([place])   . Diabetes mellitus type II   . Diabetic nephropathy ([place])   . Diabetic neuropathy ([place])   . ESRD on hemodialysis [facility])    started HD June 2017, goes to [facility] HD unit, Dr Hinda Lenis  . History of cardiac catheterization    a.Myoview 1/15:  There is significant left ventricular dysfunction. There may be slight scar at the apex. There is no significant ischemia. LV Ejection Fraction: 27%  //  b. RHC/LHC (1/15) with mean RA 6, PA 47/22 mean 33, mean PCWP 20, PVR 2.5 WU, CI 2.5; 80% dLAD stenosis, 70% diffuse large D.   . History of kidney stones   . Hyperlipidemia   . Hypertension   . Kidney stones   . NICM (nonischemic cardiomyopathy) ([place])    Primarily nonischemic. Echo (12/14) with EF 25-30%. Echo (3/15) with EF 25%, mild to moderately dilated LV, normal RV size and systolic function.   . Pneumonia   . Urethral stricture    Past Surgical History:  Procedure Laterality Date  . ABDOMINAL AORTOGRAM  W/LOWER EXTREMITY N/A 03/30/2016   Procedure: Abdominal Aortogram w/Lower Extremity;  Surgeon: Angelia Mould, MD;  Location: [place] [facility];  Service: Cardiovascular;  Laterality: N/A;  . AMPUTATION Right 04/26/2016   Procedure: Right Below Knee Amputation;  Surgeon: Newt Minion, MD;  Location: [place];  Service: Orthopedics;  Laterality: Right;  . AV FISTULA PLACEMENT Right 09/08/2015   Procedure: INSERTION OF 4-37m x 45cm  ARTERIOVENOUS (AV) GORE-TEX GRAFT RIGHT UPPER  ARM;  Surgeon: CAngelia Mould MD;  [place]  Service: Vascular;  Laterality: Right;  . AV FISTULA PLACEMENT Left 01/14/2016   Procedure: CREATION OF LEFT UPPER ARM ARTERIOVENOUS FISTULA;  Surgeon: CAngelia Mould MD;  [place]  Service: Vascular;  Laterality: Left;  . BASCILIC VEIN TRANSPOSITION Right 08/22/2014   Procedure: RIGHT UPPER ARM BASCILIC VEIN TRANSPOSITION;  Surgeon: CAngelia Mould MD;  Location: [place]  Service: Vascular;  Laterality: Right;  . BELOW KNEE LEG AMPUTATION Right 04/26/2016  . CARDIAC CATHETERIZATION    . CARDIAC DEFIBRILLATOR PLACEMENT  06/27/2013   Sub Q       BY DR KCaryl Comes . COLONOSCOPY WITH PROPOFOL N/A 07/22/2015   Procedure: COLONOSCOPY WITH PROPOFOL;  Surgeon: HDoran Stabler MD;  [facility];  Service: Gastroenterology;  Laterality: N/A;  . FEMORAL-POPLITEAL BYPASS GRAFT Right 03/31/2016   Procedure: BYPASS GRAFT FEMORAL-POPLITEAL ARTERY  USING RIGHT GREATER SAPHENOUS NONREVERSED VEIN;  Surgeon: Angelia Mould, MD;  Location: [place];  Service: Vascular;  Laterality: Right;  . HERNIA REPAIR    . I&D EXTREMITY Right 03/31/2016   Procedure: IRRIGATION AND DEBRIDEMENT FOOT;  Surgeon: Angelia Mould, MD;  Location: [place];  Service: Vascular;  Laterality: Right;  . IMPLANTABLE CARDIOVERTER DEFIBRILLATOR IMPLANT N/A 06/27/2013   Procedure: SUB Q ICD;  Surgeon: Deboraha Sprang, MD;  Location: [facility] C[facility];  Service: Cardiovascular;   Laterality: N/A;  . INTRAOPERATIVE ARTERIOGRAM Right 03/31/2016   Procedure: INTRA OPERATIVE ARTERIOGRAM;  Surgeon: Angelia Mould, MD;  Location: [place];  Service: Vascular;  Laterality: Right;  . IR GENERIC HISTORICAL Right 11/30/2015   IR THROMBECTOMY AV FISTULA W/THROMBOLYSIS/PTA INC/SHUNT/IMG RIGHT 11/30/2015 Aletta Edouard, MD [facility]  . IR GENERIC HISTORICAL  11/30/2015   IR US GUIDE VASC ACCESS RIGHT 11/30/2015 Aletta Edouard, MD [facility]  . IR GENERIC HISTORICAL Right 12/15/2015   IR THROMBECTOMY AV FISTULA W/THROMBOLYSIS/PTA/STENT INC/SHUNT/IMG RT 12/15/2015 Arne Cleveland, MD [facility]  . IR GENERIC HISTORICAL  12/15/2015   IR US GUIDE VASC ACCESS RIGHT 12/15/2015 Arne Cleveland, MD [facility]  . IR GENERIC HISTORICAL  12/28/2015   IR FLUORO GUIDE CV LINE RIGHT 12/28/2015 Marybelle Killings, MD [facility]  . IR GENERIC HISTORICAL  12/28/2015   IR US GUIDE VASC ACCESS RIGHT 12/28/2015 Marybelle Killings, MD [facility]  . LEFT A ND RIGHT HEART CATH  01/30/2013   DR Sung Amabile  . LEFT AND RIGHT HEART CATHETERIZATION WITH CORONARY ANGIOGRAM N/A 01/30/2013   Procedure: LEFT AND RIGHT HEART CATHETERIZATION WITH CORONARY ANGIOGRAM;  Surgeon: Jolaine Artist, MD;  Location: [facility] CATH LAB;  Service: Cardiovascular;  Laterality: N/A;  . PERIPHERAL VASCULAR CATHETERIZATION Right 01/26/2015   Procedure: A/V Fistulagram;  Surgeon: Angelia Mould, MD;  Location: [place] CV LAB;  Service: Cardiovascular;  Laterality: Right;  . reapea urethral surgery for recurrent obstruction  2011  . TOTAL KNEE ARTHROPLASTY Right 2007  . VEIN HARVEST Right 03/31/2016   Procedure: RIGHT GREATER SAPHENOUS VEIN HARVEST;  Surgeon: Angelia Mould, MD;  Location: [place];  Service: Vascular;  Laterality: Right;    reports that he quit smoking about 6 years ago. His smoking use included cigarettes. He has a 64.00 pack-year smoking history. he has never used smokeless tobacco. He reports that he  does not drink alcohol or use drugs. family history includes Alcohol abuse in his father; Bladder Cancer in his mother; Diabetes in his maternal grandmother; Heart Problems in his maternal grandmother; Heart disease in his maternal grandfather; Melanoma in his father; Prostate cancer in his maternal grandfather; Stroke in his maternal grandmother. Allergies  Allergen Reactions  . Morphine Sulfate Rash and Other (See Comments)    Itches all over, red spots      Review of Systems  Constitutional: Negative for chills and fever.  Genitourinary: Positive for scrotal swelling and testicular pain. Negative for dysuria.       Objective:   Physical Exam  Constitutional: He appears well-developed and well-nourished.  Cardiovascular: Normal rate and regular rhythm.  Pulmonary/Chest: Effort normal and breath sounds normal. No respiratory distress. He has no wheezes. He has no rales.  Skin:  Patient has mobile approximately 1 cm cystic swelling right parietal scalp. No overlying erythema. No fluctuance. Nontender.       Assessment:     #1 sebaceous cyst right scalp  #2 left epididymo-orchitis    Plan:     -  We agreed to one more refill Levaquin 500 milligrams every other day (with adjustment for his chronic renal disease). If not improved in 2 weeks consider urology referral -Discussed risks and benefits of sebaceous cyst excision of scalp including risks of bleeding, bruising, and infection. Patient consented. We prepped this skin with Betadine. Anesthesia with 1% Xylocaine with epinephrine. Using sterile technique throughout, we used a #15 blade and made elliptical excision of the cyst. We were able to identify the cyst and this did appear to be removed in entirety. Minimal bleeding. Wound closed with 2 sutures of 4-0 Ethilon. Topical antibiotic applied -Return in one week for wound recheck and suture removal  [name] MD [place] [facility] at [facility]

## 2017-02-06 ENCOUNTER — Telehealth: Payer: Self-pay | Admitting: *Deleted

## 2017-02-06 NOTE — Telephone Encounter (Signed)
Prior auth sent to [email or web address] for [id number].

## 2017-02-08 ENCOUNTER — Ambulatory Visit (INDEPENDENT_AMBULATORY_CARE_PROVIDER_SITE_OTHER): Payer: BLUE CROSS/BLUE SHIELD | Admitting: Family Medicine

## 2017-02-08 ENCOUNTER — Encounter: Payer: Self-pay | Admitting: Family Medicine

## 2017-02-08 VITALS — BP 160/90 | HR 85 | Temp 98.1°F | Wt 210.3 lb

## 2017-02-08 DIAGNOSIS — N451 Epididymitis: Secondary | ICD-10-CM | POA: Diagnosis not present

## 2017-02-08 DIAGNOSIS — I10 Essential (primary) hypertension: Secondary | ICD-10-CM

## 2017-02-08 NOTE — Progress Notes (Signed)
Subjective:     Patient ID: Anthony Bullock, male   DOB: September 21, 1960, 57 y.o.   MRN: [id number]  HPI Patient here for the following issues  Recent sebaceous cyst excision right scalp. Here for suture removal. Healing well with no concerns  Patient had recent epididymo-orchitis. We refilled his Levaquin 500 milligrams every other day- adjusted for his chronic kidney disease. He states the swelling and pain have improved greatly. No fevers or chills. No dysuria  Patient has hypertension. Did not take his blood pressure medications this morning which probably accounts for his elevated reading today  Type 2 diabetes. Long history of poor control. He's recently had difficulties getting his insulin and he attributes that to poor control. We've been providing samples of Basaglar. He remains on Humalog. Slowly titrating up his Basaglar and blood sugars are gradually improving  Past Medical History:  Diagnosis Date  . 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 Allergic rhinitis   . Anemia   . Arthritis   . Chronic systolic heart failure ([place])    a. ECHO (12/2012) EF 25-30%, HK entireanteroseptal myocardium //  b.  EF 25%, diffuse HK, grade 1 diastolic dysfunction, MAC, mild LAE, normal RVSF, trivial pericardial effusion  . COPD (chronic obstructive pulmonary disease) ([place])   . Diabetes mellitus type II   . Diabetic nephropathy ([place])   . Diabetic neuropathy ([place])   . ESRD on hemodialysis [facility])    started HD June 2017, goes to [facility] HD unit, Dr Hinda Lenis  . History of cardiac catheterization    a.Myoview 1/15:  There is significant left ventricular dysfunction. There may be slight scar at the apex. There is no significant ischemia. LV Ejection Fraction: 27%  //  b. RHC/LHC (1/15) with mean RA 6, PA 47/22 mean 33, mean PCWP 20, PVR 2.5 WU, CI 2.5; 80% dLAD stenosis, 70% diffuse large D.   . History of kidney stones   . Hyperlipidemia   . Hypertension   .  Kidney stones   . NICM (nonischemic cardiomyopathy) ([place])    Primarily nonischemic. Echo (12/14) with EF 25-30%. Echo (3/15) with EF 25%, mild to moderately dilated LV, normal RV size and systolic function.   . Pneumonia   . Urethral stricture    Past Surgical History:  Procedure Laterality Date  . ABDOMINAL AORTOGRAM W/LOWER EXTREMITY N/A 03/30/2016   Procedure: Abdominal Aortogram w/Lower Extremity;  Surgeon: Angelia Mould, MD;  Location: [place] [facility];  Service: Cardiovascular;  Laterality: N/A;  . AMPUTATION Right 04/26/2016   Procedure: Right Below Knee Amputation;  Surgeon: Newt Minion, MD;  Location: [place];  Service: Orthopedics;  Laterality: Right;  . AV FISTULA PLACEMENT Right 09/08/2015   Procedure: INSERTION OF 4-74m x 45cm  ARTERIOVENOUS (AV) GORE-TEX GRAFT RIGHT UPPER  ARM;  Surgeon: CAngelia Mould MD;  [place]  Service: Vascular;  Laterality: Right;  . AV FISTULA PLACEMENT Left 01/14/2016   Procedure: CREATION OF LEFT UPPER ARM ARTERIOVENOUS FISTULA;  Surgeon: CAngelia Mould MD;  [place]  Service: Vascular;  Laterality: Left;  . BASCILIC VEIN TRANSPOSITION Right 08/22/2014   Procedure: RIGHT UPPER ARM BASCILIC VEIN TRANSPOSITION;  Surgeon: CAngelia Mould MD;  [place]: [place]  Service: Vascular;  Laterality: Right;  . BELOW KNEE LEG AMPUTATION Right 04/26/2016  . CARDIAC CATHETERIZATION    . CARDIAC DEFIBRILLATOR PLACEMENT  06/27/2013   Sub Q       BY DR KCaryl Comes .  COLONOSCOPY WITH PROPOFOL N/A 07/22/2015   Procedure: COLONOSCOPY WITH PROPOFOL;  Surgeon: Doran Stabler, MD;  [facility];  Service: Gastroenterology;  Laterality: N/A;  . FEMORAL-POPLITEAL BYPASS GRAFT Right 03/31/2016   Procedure: BYPASS GRAFT FEMORAL-POPLITEAL ARTERY USING RIGHT GREATER SAPHENOUS NONREVERSED VEIN;  Surgeon: Angelia Mould, MD;  Location: [place];  Service: Vascular;  Laterality: Right;  . HERNIA REPAIR    . I&D EXTREMITY Right  03/31/2016   Procedure: IRRIGATION AND DEBRIDEMENT FOOT;  Surgeon: Angelia Mould, MD;  Location: [place];  Service: Vascular;  Laterality: Right;  . IMPLANTABLE CARDIOVERTER DEFIBRILLATOR IMPLANT N/A 06/27/2013   Procedure: SUB Q ICD;  Surgeon: Deboraha Sprang, MD;  Location: [facility] CATH LAB;  Service: Cardiovascular;  Laterality: N/A;  . INTRAOPERATIVE ARTERIOGRAM Right 03/31/2016   Procedure: INTRA OPERATIVE ARTERIOGRAM;  Surgeon: Angelia Mould, MD;  Location: [place];  Service: Vascular;  Laterality: Right;  . IR GENERIC HISTORICAL Right 11/30/2015   IR THROMBECTOMY AV FISTULA W/THROMBOLYSIS/PTA INC/SHUNT/IMG RIGHT 11/30/2015 Aletta Edouard, MD [facility]  . IR GENERIC HISTORICAL  11/30/2015   IR US GUIDE VASC ACCESS RIGHT 11/30/2015 Aletta Edouard, MD [facility]  . IR GENERIC HISTORICAL Right 12/15/2015   IR THROMBECTOMY AV FISTULA W/THROMBOLYSIS/PTA/STENT INC/SHUNT/IMG RT 12/15/2015 Arne Cleveland, MD [facility]  . IR GENERIC HISTORICAL  12/15/2015   IR US GUIDE VASC ACCESS RIGHT 12/15/2015 Arne Cleveland, MD [facility]  . IR GENERIC HISTORICAL  12/28/2015   IR FLUORO GUIDE CV LINE RIGHT 12/28/2015 Marybelle Killings, MD [facility]  . IR GENERIC HISTORICAL  12/28/2015   IR US GUIDE VASC ACCESS RIGHT 12/28/2015 Marybelle Killings, MD [facility]  . LEFT A ND RIGHT HEART CATH  01/30/2013   DR Sung Amabile  . LEFT AND RIGHT HEART CATHETERIZATION WITH CORONARY ANGIOGRAM N/A 01/30/2013   Procedure: LEFT AND RIGHT HEART CATHETERIZATION WITH CORONARY ANGIOGRAM;  Surgeon: Jolaine Artist, MD;  Location: [facility] C[facility] LAB;  Service: Cardiovascular;  Laterality: N/A;  . PERIPHERAL VASCULAR CATHETERIZATION Right 01/26/2015   Procedure: A/V Fistulagram;  Surgeon: Angelia Mould, MD;  Location: [place] [facility];  Service: Cardiovascular;  Laterality: Right;  . reapea urethral surgery for recurrent obstruction  2011  . TOTAL KNEE ARTHROPLASTY Right 2007  . VEIN HARVEST Right 03/31/2016    Procedure: RIGHT GREATER SAPHENOUS VEIN HARVEST;  Surgeon: Angelia Mould, MD;  Location: [place];  Service: Vascular;  Laterality: Right;    reports that he quit smoking about 6 years ago. His smoking use included cigarettes. He has a 64.00 pack-year smoking history. he has never used smokeless tobacco. He reports that he does not drink alcohol or use drugs. family history includes Alcohol abuse in his father; Bladder Cancer in his mother; Diabetes in his maternal grandmother; Heart Problems in his maternal grandmother; Heart disease in his maternal grandfather; Melanoma in his father; Prostate cancer in his maternal grandfather; Stroke in his maternal grandmother. Allergies  Allergen Reactions  . Morphine Sulfate Rash and Other (See Comments)    Itches all over, red spots     Review of Systems  Constitutional: Negative for fatigue.  Eyes: Negative for visual disturbance.  Respiratory: Negative for cough, chest tightness and shortness of breath.   Cardiovascular: Negative for chest pain, palpitations and leg swelling.  Genitourinary: Negative for dysuria.  Neurological: Negative for dizziness, syncope, weakness, light-headedness and headaches.       Objective:   Physical Exam  Constitutional: He is oriented to person, place,  and time. He appears well-developed and well-nourished.  HENT:  Right Ear: External ear normal.  Left Ear: External ear normal.  Mouth/Throat: Oropharynx is clear and moist.  Eyes: Pupils are equal, round, and reactive to light.  Neck: Neck supple. No thyromegaly present.  Cardiovascular: Normal rate and regular rhythm.  Pulmonary/Chest: Effort normal and breath sounds normal. No respiratory distress. He has no wheezes. He has no rales.  Musculoskeletal: He exhibits no edema.  Neurological: He is alert and oriented to person, place, and time.  Skin:  Right parietal scalp examined. He has small eschar. 2 sutures are removed. No erythema. Healing well.        Assessment:     #1 suture removal following recent sebaceous cyst excision scalp  #2 hypertension. Poorly controlled by today's reading but patient did not take medication this morning  #3 recent epididymo-orchitis improving on Levaquin    Plan:     -sutures removed without difficulty -Follow-up in 3 months and reassess A1c -We are working on prior authorization for his insulin. He is not a candidate for most oral medications because of his chronic kidney disease.  [name] MD [facility]

## 2017-02-17 ENCOUNTER — Ambulatory Visit (INDEPENDENT_AMBULATORY_CARE_PROVIDER_SITE_OTHER): Payer: BLUE CROSS/BLUE SHIELD | Admitting: Orthopedic Surgery

## 2017-02-20 ENCOUNTER — Encounter (INDEPENDENT_AMBULATORY_CARE_PROVIDER_SITE_OTHER): Payer: Self-pay | Admitting: Orthopedic Surgery

## 2017-02-20 ENCOUNTER — Ambulatory Visit (INDEPENDENT_AMBULATORY_CARE_PROVIDER_SITE_OTHER): Payer: BLUE CROSS/BLUE SHIELD | Admitting: Orthopedic Surgery

## 2017-02-20 VITALS — Ht 70.0 in | Wt 210.0 lb

## 2017-02-20 DIAGNOSIS — Z89511 Acquired absence of right leg below knee: Secondary | ICD-10-CM | POA: Diagnosis not present

## 2017-02-20 DIAGNOSIS — B351 Tinea unguium: Secondary | ICD-10-CM

## 2017-02-20 DIAGNOSIS — E1142 Type 2 diabetes mellitus with diabetic polyneuropathy: Secondary | ICD-10-CM | POA: Diagnosis not present

## 2017-02-20 NOTE — Progress Notes (Signed)
Office Visit Note   Patient: Anthony Bullock           Date of Birth: Jun 07, 1960           MRN: [id number] Visit Date: 02/20/2017              Requested by: [name], MD [place], [place] [address] PCP: [name], MD  Chief Complaint  Patient presents with  . Right Leg - Follow-up  . Left Foot - Nail Problem      HPI: Patient is a 57 year old gentleman status post right transtibial amputation who presents for evaluation for the residual limb on the right as well as painful onychomycotic nails on the left. Patient doing well overall, ambulating in prosthesis on right.   Assessment & Plan: Visit Diagnoses:  No diagnosis found.  Plan: Nails were trimmed 5 on the left foot. Patient will follow up with [name] in 3 months for reevaluation or sooner if he develops any blisters or ulcers.  Follow-Up Instructions: No Follow-up on file.   Ortho Exam  Patient is alert, oriented, no adenopathy, well-dressed, normal affect, normal respiratory effort. Examination patient has an antalgic gait. Examination the right transtibial amputation there was a well consolidated residual limb no ulcers no callus no skin breakdown. There is no cellulitis no signs of infection. Examination the left foot he has no plantar ulcers no venous stasis ulcers he does have thickened discolored onychomycotic nails 5 he is unable to safely trim nails on his own due to his insensate diabetic neuropathy and the nails were trimmed 5 without complications.  Imaging: No results found.  Labs: Lab Results  Component Value Date   HGBA1C 11.0 (H) 01/25/2017   HGBA1C 13.8 10/24/2016   HGBA1C 11.3 (H) 03/30/2016   ESRSEDRATE 94 (H) 03/30/2016   ESRSEDRATE 18 11/30/2012   CRP 16.1 (H) 03/30/2016   REPTSTATUS 04/05/2016 FINAL 03/31/2016   REPTSTATUS 04/04/2016 FINAL 03/31/2016   GRAMSTAIN  03/31/2016    RARE WBC PRESENT, PREDOMINANTLY PMN NO ORGANISMS SEEN    CULT NO ANAEROBES  ISOLATED 03/31/2016   CULT  03/31/2016    RARE STENOTROPHOMONAS MALTOPHILIA RARE ENTEROBACTER SPECIES    LABORGA STENOTROPHOMONAS MALTOPHILIA 03/31/2016   LABORGA ENTEROBACTER SPECIES 03/31/2016    Orders:  No orders of the defined types were placed in this encounter.  No orders of the defined types were placed in this encounter.    Procedures: No procedures performed  Clinical Data: No additional findings.  ROS:  All other systems negative, except as noted in the HPI. Review of Systems  Constitutional: Negative for chills and fever.  Skin: Negative for color change and wound.    Objective: Vital Signs: Ht _0  (1.778 m)   Wt 210 lb (95.3 kg)   BMI 30.13 kg/m   Specialty Comments:  No specialty comments available.  PMFS History: Patient Active Problem List   Diagnosis Date Noted  . Onychomycosis 08/16/2016  . Status post unilateral below knee amputation, right ([place]) 04/26/2016  . Bilateral carotid bruits 03/30/2016  . Diabetes mellitus with complication ([place])   . Anemia due to chronic kidney disease   . ESRD (end stage renal disease) on dialysis ([place]) 11/30/2015  . Acute respiratory failure with hypoxia ([place]) 11/29/2015  . At high risk for falls 09/29/2014  . Peripheral vascular disease ([place]) 09/29/2014  . Osteoarthritis of both knees 01/07/2014  . Osteoarthritis of multiple joints 10/06/2013  . NICM (nonischemic cardiomyopathy) ([place]) 06/27/2013  .  CAD (coronary artery disease) 03/13/2013  . Abnormal nuclear stress test 01/27/2013  . Chronic systolic congestive heart failure ([place]) 01/27/2013  . COPD (chronic obstructive pulmonary disease) ([place]) 12/27/2012  . Diabetic polyneuropathy associated with type 2 diabetes mellitus ([place]) 02/10/2012  . PYELITIS/PYELONEPHRITIS DISEASES CLASSIFIED ELSW 09/28/2009  . URINARY CALCULUS 09/28/2009  . Adjustment disorder with depressed mood 08/21/2009  . Poorly controlled type II diabetes mellitus with renal complication  ([place]) 62/13/0865  . Hyperlipidemia 04/14/2008  . Essential hypertension 04/14/2008  . ALLERGIC RHINITIS 04/14/2008   Past Medical History:  Diagnosis Date  . 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 Allergic rhinitis   . Anemia   . Arthritis   . Chronic systolic heart failure ([place])    a. ECHO (12/2012) EF 25-30%, HK entireanteroseptal myocardium //  b.  EF 25%, diffuse HK, grade 1 diastolic dysfunction, MAC, mild LAE, normal RVSF, trivial pericardial effusion  . COPD (chronic obstructive pulmonary disease) ([place])   . Diabetes mellitus type II   . Diabetic nephropathy ([place])   . Diabetic neuropathy ([place])   . ESRD on hemodialysis [facility])    started HD June 2017, goes to [facility] HD unit, Dr Hinda Lenis  . History of cardiac catheterization    a.Myoview 1/15:  There is significant left ventricular dysfunction. There may be slight scar at the apex. There is no significant ischemia. LV Ejection Fraction: 27%  //  b. RHC/LHC (1/15) with mean RA 6, PA 47/22 mean 33, mean PCWP 20, PVR 2.5 WU, CI 2.5; 80% dLAD stenosis, 70% diffuse large D.   . History of kidney stones   . Hyperlipidemia   . Hypertension   . Kidney stones   . NICM (nonischemic cardiomyopathy) ([place])    Primarily nonischemic. Echo (12/14) with EF 25-30%. Echo (3/15) with EF 25%, mild to moderately dilated LV, normal RV size and systolic function.   . Pneumonia   . Urethral stricture     Family History  Problem Relation Age of Onset  . Bladder Cancer Mother   . Alcohol abuse Father   . Melanoma Father   . Stroke Maternal Grandmother   . Heart Problems Maternal Grandmother        unknown  . Diabetes Maternal Grandmother   . Heart disease Maternal Grandfather   . Prostate cancer Maternal Grandfather     Past Surgical History:  Procedure Laterality Date  . ABDOMINAL AORTOGRAM W/LOWER EXTREMITY N/A 03/30/2016   Procedure: Abdominal Aortogram w/Lower Extremity;  Surgeon: Angelia Mould, MD;  Location: [place] [facility];  Service: Cardiovascular;  Laterality: N/A;  . AMPUTATION Right 04/26/2016   Procedure: Right Below Knee Amputation;  Surgeon: Newt Minion, MD;  Location: [place];  Service: Orthopedics;  Laterality: Right;  . AV FISTULA PLACEMENT Right 09/08/2015   Procedure: INSERTION OF 4-58m x 45cm  ARTERIOVENOUS (AV) GORE-TEX GRAFT RIGHT UPPER  ARM;  Surgeon: CAngelia Mould MD;  [place]  Service: Vascular;  Laterality: Right;  . AV FISTULA PLACEMENT Left 01/14/2016   Procedure: CREATION OF LEFT UPPER ARM ARTERIOVENOUS FISTULA;  Surgeon: CAngelia Mould MD;  Location: [place]  Service: Vascular;  Laterality: Left;  . BASCILIC VEIN TRANSPOSITION Right 08/22/2014   Procedure: RIGHT UPPER ARM BASCILIC VEIN TRANSPOSITION;  Surgeon: CAngelia Mould MD;  [place]  Service: Vascular;  Laterality: Right;  . BELOW KNEE LEG AMPUTATION Right 04/26/2016  . CARDIAC CATHETERIZATION    . CARDIAC DEFIBRILLATOR PLACEMENT  06/27/2013   Sub Q       BY DR Caryl Comes  . COLONOSCOPY WITH PROPOFOL N/A 07/22/2015   Procedure: COLONOSCOPY WITH PROPOFOL;  Surgeon: Doran Stabler, MD;  [facility];  Service: Gastroenterology;  Laterality: N/A;  . FEMORAL-POPLITEAL BYPASS GRAFT Right 03/31/2016   Procedure: BYPASS GRAFT FEMORAL-POPLITEAL ARTERY USING RIGHT GREATER SAPHENOUS NONREVERSED VEIN;  Surgeon: Angelia Mould, MD;  Location: [place];  Service: Vascular;  Laterality: Right;  . HERNIA REPAIR    . I&D EXTREMITY Right 03/31/2016   Procedure: IRRIGATION AND DEBRIDEMENT FOOT;  Surgeon: Angelia Mould, MD;  Location: [place];  Service: Vascular;  Laterality: Right;  . IMPLANTABLE CARDIOVERTER DEFIBRILLATOR IMPLANT N/A 06/27/2013   Procedure: SUB Q ICD;  Surgeon: Deboraha Sprang, MD;  Location: [facility] CATH LAB;  Service: Cardiovascular;  Laterality: N/A;  . INTRAOPERATIVE ARTERIOGRAM Right 03/31/2016   Procedure: INTRA OPERATIVE ARTERIOGRAM;  Surgeon:  Angelia Mould, MD;  Location: [place];  Service: Vascular;  Laterality: Right;  . IR GENERIC HISTORICAL Right 11/30/2015   IR THROMBECTOMY AV FISTULA W/THROMBOLYSIS/PTA INC/SHUNT/IMG RIGHT 11/30/2015 Aletta Edouard, MD [facility]  . IR GENERIC HISTORICAL  11/30/2015   IR US GUIDE VASC ACCESS RIGHT 11/30/2015 Aletta Edouard, MD [facility]  . IR GENERIC HISTORICAL Right 12/15/2015   IR THROMBECTOMY AV FISTULA W/THROMBOLYSIS/PTA/STENT INC/SHUNT/IMG RT 12/15/2015 Arne Cleveland, MD [facility]  . IR GENERIC HISTORICAL  12/15/2015   IR US GUIDE VASC ACCESS RIGHT 12/15/2015 Arne Cleveland, MD [facility]  . IR GENERIC HISTORICAL  12/28/2015   IR FLUORO GUIDE CV LINE RIGHT 12/28/2015 Marybelle Killings, MD [facility]  . IR GENERIC HISTORICAL  12/28/2015   IR US GUIDE VASC ACCESS RIGHT 12/28/2015 Marybelle Killings, MD [facility]  . LEFT A ND RIGHT HEART CATH  01/30/2013   DR Sung Amabile  . LEFT AND RIGHT HEART CATHETERIZATION WITH CORONARY ANGIOGRAM N/A 01/30/2013   Procedure: LEFT AND RIGHT HEART CATHETERIZATION WITH CORONARY ANGIOGRAM;  Surgeon: Jolaine Artist, MD;  Location: [facility] CATH LAB;  Service: Cardiovascular;  Laterality: N/A;  . PERIPHERAL VASCULAR CATHETERIZATION Right 01/26/2015   Procedure: A/V Fistulagram;  Surgeon: Angelia Mould, MD;  Location: [place] CV LAB;  Service: Cardiovascular;  Laterality: Right;  . reapea urethral surgery for recurrent obstruction  2011  . TOTAL KNEE ARTHROPLASTY Right 2007  . VEIN HARVEST Right 03/31/2016   Procedure: RIGHT GREATER SAPHENOUS VEIN HARVEST;  Surgeon: Angelia Mould, MD;  Location: [place];  Service: Vascular;  Laterality: Right;   Social History   Occupational History  . Not on file  Tobacco Use  . Smoking status: Former Smoker    Packs/day: 2.00    Years: 32.00    Pack years: 64.00    Types: Cigarettes    Last attempt to quit: 05/11/2010    Years since quitting: 6.7  . Smokeless tobacco: Never Used  Substance and  Sexual Activity  . Alcohol use: No  . Drug use: No  . Sexual activity: Yes

## 2017-03-11 DIAGNOSIS — Z992 Dependence on renal dialysis: Secondary | ICD-10-CM | POA: Diagnosis not present

## 2017-03-11 DIAGNOSIS — N186 End stage renal disease: Secondary | ICD-10-CM | POA: Diagnosis not present

## 2017-03-11 DIAGNOSIS — D631 Anemia in chronic kidney disease: Secondary | ICD-10-CM | POA: Diagnosis not present

## 2017-03-11 DIAGNOSIS — N2581 Secondary hyperparathyroidism of renal origin: Secondary | ICD-10-CM | POA: Diagnosis not present

## 2017-03-14 DIAGNOSIS — D631 Anemia in chronic kidney disease: Secondary | ICD-10-CM | POA: Diagnosis not present

## 2017-03-14 DIAGNOSIS — Z992 Dependence on renal dialysis: Secondary | ICD-10-CM | POA: Diagnosis not present

## 2017-03-14 DIAGNOSIS — N186 End stage renal disease: Secondary | ICD-10-CM | POA: Diagnosis not present

## 2017-03-14 DIAGNOSIS — N2581 Secondary hyperparathyroidism of renal origin: Secondary | ICD-10-CM | POA: Diagnosis not present

## 2017-03-16 DIAGNOSIS — N186 End stage renal disease: Secondary | ICD-10-CM | POA: Diagnosis not present

## 2017-03-16 DIAGNOSIS — Z992 Dependence on renal dialysis: Secondary | ICD-10-CM | POA: Diagnosis not present

## 2017-03-16 DIAGNOSIS — D631 Anemia in chronic kidney disease: Secondary | ICD-10-CM | POA: Diagnosis not present

## 2017-03-16 DIAGNOSIS — N2581 Secondary hyperparathyroidism of renal origin: Secondary | ICD-10-CM | POA: Diagnosis not present

## 2017-03-18 DIAGNOSIS — N2581 Secondary hyperparathyroidism of renal origin: Secondary | ICD-10-CM | POA: Diagnosis not present

## 2017-03-18 DIAGNOSIS — N186 End stage renal disease: Secondary | ICD-10-CM | POA: Diagnosis not present

## 2017-03-18 DIAGNOSIS — D631 Anemia in chronic kidney disease: Secondary | ICD-10-CM | POA: Diagnosis not present

## 2017-03-18 DIAGNOSIS — Z992 Dependence on renal dialysis: Secondary | ICD-10-CM | POA: Diagnosis not present

## 2017-03-20 ENCOUNTER — Ambulatory Visit (INDEPENDENT_AMBULATORY_CARE_PROVIDER_SITE_OTHER): Payer: BLUE CROSS/BLUE SHIELD | Admitting: *Deleted

## 2017-03-20 DIAGNOSIS — I428 Other cardiomyopathies: Secondary | ICD-10-CM | POA: Diagnosis not present

## 2017-03-20 DIAGNOSIS — I5022 Chronic systolic (congestive) heart failure: Secondary | ICD-10-CM

## 2017-03-20 DIAGNOSIS — Z4502 Encounter for adjustment and management of automatic implantable cardiac defibrillator: Secondary | ICD-10-CM

## 2017-03-20 LAB — CUP PACEART INCLINIC DEVICE CHECK
Date Time Interrogation Session: 20190311113232
Implantable Pulse Generator Implant Date: 20150618
Pulse Gen Model: 1010
Pulse Gen Serial Number: 111255

## 2017-03-20 NOTE — Progress Notes (Signed)
Subcutaneous ICD check in clinic. 0 untreated episodes; 0 treated episodes; 0 shocks delivered. Electrode impedance status okay. No programming changes. Remaining longevity to ERI 41%. ROV with Device Clinic 06/19/17 and with [facility] in December.

## 2017-03-21 DIAGNOSIS — N186 End stage renal disease: Secondary | ICD-10-CM | POA: Diagnosis not present

## 2017-03-21 DIAGNOSIS — D631 Anemia in chronic kidney disease: Secondary | ICD-10-CM | POA: Diagnosis not present

## 2017-03-21 DIAGNOSIS — Z992 Dependence on renal dialysis: Secondary | ICD-10-CM | POA: Diagnosis not present

## 2017-03-21 DIAGNOSIS — N2581 Secondary hyperparathyroidism of renal origin: Secondary | ICD-10-CM | POA: Diagnosis not present

## 2017-03-23 DIAGNOSIS — N186 End stage renal disease: Secondary | ICD-10-CM | POA: Diagnosis not present

## 2017-03-23 DIAGNOSIS — Z992 Dependence on renal dialysis: Secondary | ICD-10-CM | POA: Diagnosis not present

## 2017-03-23 DIAGNOSIS — D631 Anemia in chronic kidney disease: Secondary | ICD-10-CM | POA: Diagnosis not present

## 2017-03-23 DIAGNOSIS — N2581 Secondary hyperparathyroidism of renal origin: Secondary | ICD-10-CM | POA: Diagnosis not present

## 2017-03-25 DIAGNOSIS — N2581 Secondary hyperparathyroidism of renal origin: Secondary | ICD-10-CM | POA: Diagnosis not present

## 2017-03-25 DIAGNOSIS — D509 Iron deficiency anemia, unspecified: Secondary | ICD-10-CM | POA: Diagnosis not present

## 2017-03-25 DIAGNOSIS — Z992 Dependence on renal dialysis: Secondary | ICD-10-CM | POA: Diagnosis not present

## 2017-03-25 DIAGNOSIS — N186 End stage renal disease: Secondary | ICD-10-CM | POA: Diagnosis not present

## 2017-03-28 DIAGNOSIS — N2581 Secondary hyperparathyroidism of renal origin: Secondary | ICD-10-CM | POA: Diagnosis not present

## 2017-03-28 DIAGNOSIS — Z992 Dependence on renal dialysis: Secondary | ICD-10-CM | POA: Diagnosis not present

## 2017-03-28 DIAGNOSIS — N186 End stage renal disease: Secondary | ICD-10-CM | POA: Diagnosis not present

## 2017-03-28 DIAGNOSIS — D509 Iron deficiency anemia, unspecified: Secondary | ICD-10-CM | POA: Diagnosis not present

## 2017-03-30 DIAGNOSIS — N186 End stage renal disease: Secondary | ICD-10-CM | POA: Diagnosis not present

## 2017-03-30 DIAGNOSIS — N2581 Secondary hyperparathyroidism of renal origin: Secondary | ICD-10-CM | POA: Diagnosis not present

## 2017-03-30 DIAGNOSIS — Z992 Dependence on renal dialysis: Secondary | ICD-10-CM | POA: Diagnosis not present

## 2017-03-30 DIAGNOSIS — D509 Iron deficiency anemia, unspecified: Secondary | ICD-10-CM | POA: Diagnosis not present

## 2017-04-01 DIAGNOSIS — N186 End stage renal disease: Secondary | ICD-10-CM | POA: Diagnosis not present

## 2017-04-01 DIAGNOSIS — N2581 Secondary hyperparathyroidism of renal origin: Secondary | ICD-10-CM | POA: Diagnosis not present

## 2017-04-01 DIAGNOSIS — Z992 Dependence on renal dialysis: Secondary | ICD-10-CM | POA: Diagnosis not present

## 2017-04-01 DIAGNOSIS — D509 Iron deficiency anemia, unspecified: Secondary | ICD-10-CM | POA: Diagnosis not present

## 2017-04-04 DIAGNOSIS — N186 End stage renal disease: Secondary | ICD-10-CM | POA: Diagnosis not present

## 2017-04-04 DIAGNOSIS — D509 Iron deficiency anemia, unspecified: Secondary | ICD-10-CM | POA: Diagnosis not present

## 2017-04-04 DIAGNOSIS — N2581 Secondary hyperparathyroidism of renal origin: Secondary | ICD-10-CM | POA: Diagnosis not present

## 2017-04-04 DIAGNOSIS — Z794 Long term (current) use of insulin: Secondary | ICD-10-CM | POA: Diagnosis not present

## 2017-04-04 DIAGNOSIS — E119 Type 2 diabetes mellitus without complications: Secondary | ICD-10-CM | POA: Diagnosis not present

## 2017-04-04 DIAGNOSIS — Z992 Dependence on renal dialysis: Secondary | ICD-10-CM | POA: Diagnosis not present

## 2017-04-06 DIAGNOSIS — D509 Iron deficiency anemia, unspecified: Secondary | ICD-10-CM | POA: Diagnosis not present

## 2017-04-06 DIAGNOSIS — N186 End stage renal disease: Secondary | ICD-10-CM | POA: Diagnosis not present

## 2017-04-06 DIAGNOSIS — N2581 Secondary hyperparathyroidism of renal origin: Secondary | ICD-10-CM | POA: Diagnosis not present

## 2017-04-06 DIAGNOSIS — Z992 Dependence on renal dialysis: Secondary | ICD-10-CM | POA: Diagnosis not present

## 2017-04-08 DIAGNOSIS — N186 End stage renal disease: Secondary | ICD-10-CM | POA: Diagnosis not present

## 2017-04-08 DIAGNOSIS — D509 Iron deficiency anemia, unspecified: Secondary | ICD-10-CM | POA: Diagnosis not present

## 2017-04-08 DIAGNOSIS — Z992 Dependence on renal dialysis: Secondary | ICD-10-CM | POA: Diagnosis not present

## 2017-04-08 DIAGNOSIS — N2581 Secondary hyperparathyroidism of renal origin: Secondary | ICD-10-CM | POA: Diagnosis not present

## 2017-04-09 DIAGNOSIS — Z992 Dependence on renal dialysis: Secondary | ICD-10-CM | POA: Diagnosis not present

## 2017-04-09 DIAGNOSIS — N186 End stage renal disease: Secondary | ICD-10-CM | POA: Diagnosis not present

## 2017-04-11 DIAGNOSIS — N186 End stage renal disease: Secondary | ICD-10-CM | POA: Diagnosis not present

## 2017-04-11 DIAGNOSIS — D631 Anemia in chronic kidney disease: Secondary | ICD-10-CM | POA: Diagnosis not present

## 2017-04-11 DIAGNOSIS — Z992 Dependence on renal dialysis: Secondary | ICD-10-CM | POA: Diagnosis not present

## 2017-04-11 DIAGNOSIS — D509 Iron deficiency anemia, unspecified: Secondary | ICD-10-CM | POA: Diagnosis not present

## 2017-04-11 DIAGNOSIS — N2581 Secondary hyperparathyroidism of renal origin: Secondary | ICD-10-CM | POA: Diagnosis not present

## 2017-04-13 DIAGNOSIS — N186 End stage renal disease: Secondary | ICD-10-CM | POA: Diagnosis not present

## 2017-04-13 DIAGNOSIS — Z992 Dependence on renal dialysis: Secondary | ICD-10-CM | POA: Diagnosis not present

## 2017-04-13 DIAGNOSIS — D509 Iron deficiency anemia, unspecified: Secondary | ICD-10-CM | POA: Diagnosis not present

## 2017-04-13 DIAGNOSIS — N2581 Secondary hyperparathyroidism of renal origin: Secondary | ICD-10-CM | POA: Diagnosis not present

## 2017-04-13 DIAGNOSIS — D631 Anemia in chronic kidney disease: Secondary | ICD-10-CM | POA: Diagnosis not present

## 2017-04-15 DIAGNOSIS — N2581 Secondary hyperparathyroidism of renal origin: Secondary | ICD-10-CM | POA: Diagnosis not present

## 2017-04-15 DIAGNOSIS — D631 Anemia in chronic kidney disease: Secondary | ICD-10-CM | POA: Diagnosis not present

## 2017-04-15 DIAGNOSIS — Z992 Dependence on renal dialysis: Secondary | ICD-10-CM | POA: Diagnosis not present

## 2017-04-15 DIAGNOSIS — N186 End stage renal disease: Secondary | ICD-10-CM | POA: Diagnosis not present

## 2017-04-15 DIAGNOSIS — D509 Iron deficiency anemia, unspecified: Secondary | ICD-10-CM | POA: Diagnosis not present

## 2017-04-18 DIAGNOSIS — D631 Anemia in chronic kidney disease: Secondary | ICD-10-CM | POA: Diagnosis not present

## 2017-04-18 DIAGNOSIS — Z992 Dependence on renal dialysis: Secondary | ICD-10-CM | POA: Diagnosis not present

## 2017-04-18 DIAGNOSIS — D509 Iron deficiency anemia, unspecified: Secondary | ICD-10-CM | POA: Diagnosis not present

## 2017-04-18 DIAGNOSIS — N186 End stage renal disease: Secondary | ICD-10-CM | POA: Diagnosis not present

## 2017-04-18 DIAGNOSIS — N2581 Secondary hyperparathyroidism of renal origin: Secondary | ICD-10-CM | POA: Diagnosis not present

## 2017-04-20 DIAGNOSIS — Z992 Dependence on renal dialysis: Secondary | ICD-10-CM | POA: Diagnosis not present

## 2017-04-20 DIAGNOSIS — D509 Iron deficiency anemia, unspecified: Secondary | ICD-10-CM | POA: Diagnosis not present

## 2017-04-20 DIAGNOSIS — D631 Anemia in chronic kidney disease: Secondary | ICD-10-CM | POA: Diagnosis not present

## 2017-04-20 DIAGNOSIS — N186 End stage renal disease: Secondary | ICD-10-CM | POA: Diagnosis not present

## 2017-04-20 DIAGNOSIS — N2581 Secondary hyperparathyroidism of renal origin: Secondary | ICD-10-CM | POA: Diagnosis not present

## 2017-04-22 DIAGNOSIS — D631 Anemia in chronic kidney disease: Secondary | ICD-10-CM | POA: Diagnosis not present

## 2017-04-22 DIAGNOSIS — N186 End stage renal disease: Secondary | ICD-10-CM | POA: Diagnosis not present

## 2017-04-22 DIAGNOSIS — N2581 Secondary hyperparathyroidism of renal origin: Secondary | ICD-10-CM | POA: Diagnosis not present

## 2017-04-22 DIAGNOSIS — D509 Iron deficiency anemia, unspecified: Secondary | ICD-10-CM | POA: Diagnosis not present

## 2017-04-22 DIAGNOSIS — Z992 Dependence on renal dialysis: Secondary | ICD-10-CM | POA: Diagnosis not present

## 2017-04-25 DIAGNOSIS — D509 Iron deficiency anemia, unspecified: Secondary | ICD-10-CM | POA: Diagnosis not present

## 2017-04-25 DIAGNOSIS — D631 Anemia in chronic kidney disease: Secondary | ICD-10-CM | POA: Diagnosis not present

## 2017-04-25 DIAGNOSIS — N2581 Secondary hyperparathyroidism of renal origin: Secondary | ICD-10-CM | POA: Diagnosis not present

## 2017-04-25 DIAGNOSIS — Z992 Dependence on renal dialysis: Secondary | ICD-10-CM | POA: Diagnosis not present

## 2017-04-25 DIAGNOSIS — N186 End stage renal disease: Secondary | ICD-10-CM | POA: Diagnosis not present

## 2017-04-27 DIAGNOSIS — D509 Iron deficiency anemia, unspecified: Secondary | ICD-10-CM | POA: Diagnosis not present

## 2017-04-27 DIAGNOSIS — Z992 Dependence on renal dialysis: Secondary | ICD-10-CM | POA: Diagnosis not present

## 2017-04-27 DIAGNOSIS — D631 Anemia in chronic kidney disease: Secondary | ICD-10-CM | POA: Diagnosis not present

## 2017-04-27 DIAGNOSIS — N2581 Secondary hyperparathyroidism of renal origin: Secondary | ICD-10-CM | POA: Diagnosis not present

## 2017-04-27 DIAGNOSIS — N186 End stage renal disease: Secondary | ICD-10-CM | POA: Diagnosis not present

## 2017-04-29 DIAGNOSIS — D631 Anemia in chronic kidney disease: Secondary | ICD-10-CM | POA: Diagnosis not present

## 2017-04-29 DIAGNOSIS — N186 End stage renal disease: Secondary | ICD-10-CM | POA: Diagnosis not present

## 2017-04-29 DIAGNOSIS — Z992 Dependence on renal dialysis: Secondary | ICD-10-CM | POA: Diagnosis not present

## 2017-04-29 DIAGNOSIS — D509 Iron deficiency anemia, unspecified: Secondary | ICD-10-CM | POA: Diagnosis not present

## 2017-04-29 DIAGNOSIS — N2581 Secondary hyperparathyroidism of renal origin: Secondary | ICD-10-CM | POA: Diagnosis not present

## 2017-05-02 DIAGNOSIS — Z992 Dependence on renal dialysis: Secondary | ICD-10-CM | POA: Diagnosis not present

## 2017-05-02 DIAGNOSIS — D509 Iron deficiency anemia, unspecified: Secondary | ICD-10-CM | POA: Diagnosis not present

## 2017-05-02 DIAGNOSIS — N2581 Secondary hyperparathyroidism of renal origin: Secondary | ICD-10-CM | POA: Diagnosis not present

## 2017-05-02 DIAGNOSIS — N186 End stage renal disease: Secondary | ICD-10-CM | POA: Diagnosis not present

## 2017-05-02 DIAGNOSIS — D631 Anemia in chronic kidney disease: Secondary | ICD-10-CM | POA: Diagnosis not present

## 2017-05-04 DIAGNOSIS — Z992 Dependence on renal dialysis: Secondary | ICD-10-CM | POA: Diagnosis not present

## 2017-05-04 DIAGNOSIS — D631 Anemia in chronic kidney disease: Secondary | ICD-10-CM | POA: Diagnosis not present

## 2017-05-04 DIAGNOSIS — N2581 Secondary hyperparathyroidism of renal origin: Secondary | ICD-10-CM | POA: Diagnosis not present

## 2017-05-04 DIAGNOSIS — N186 End stage renal disease: Secondary | ICD-10-CM | POA: Diagnosis not present

## 2017-05-04 DIAGNOSIS — D509 Iron deficiency anemia, unspecified: Secondary | ICD-10-CM | POA: Diagnosis not present

## 2017-05-05 DIAGNOSIS — N186 End stage renal disease: Secondary | ICD-10-CM | POA: Diagnosis not present

## 2017-05-05 DIAGNOSIS — D509 Iron deficiency anemia, unspecified: Secondary | ICD-10-CM | POA: Diagnosis not present

## 2017-05-05 DIAGNOSIS — Z992 Dependence on renal dialysis: Secondary | ICD-10-CM | POA: Diagnosis not present

## 2017-05-05 DIAGNOSIS — D631 Anemia in chronic kidney disease: Secondary | ICD-10-CM | POA: Diagnosis not present

## 2017-05-05 DIAGNOSIS — N2581 Secondary hyperparathyroidism of renal origin: Secondary | ICD-10-CM | POA: Diagnosis not present

## 2017-05-09 DIAGNOSIS — N186 End stage renal disease: Secondary | ICD-10-CM | POA: Diagnosis not present

## 2017-05-09 DIAGNOSIS — D631 Anemia in chronic kidney disease: Secondary | ICD-10-CM | POA: Diagnosis not present

## 2017-05-09 DIAGNOSIS — D509 Iron deficiency anemia, unspecified: Secondary | ICD-10-CM | POA: Diagnosis not present

## 2017-05-09 DIAGNOSIS — N2581 Secondary hyperparathyroidism of renal origin: Secondary | ICD-10-CM | POA: Diagnosis not present

## 2017-05-09 DIAGNOSIS — Z992 Dependence on renal dialysis: Secondary | ICD-10-CM | POA: Diagnosis not present

## 2017-05-11 DIAGNOSIS — D509 Iron deficiency anemia, unspecified: Secondary | ICD-10-CM | POA: Diagnosis not present

## 2017-05-11 DIAGNOSIS — N186 End stage renal disease: Secondary | ICD-10-CM | POA: Diagnosis not present

## 2017-05-11 DIAGNOSIS — Z992 Dependence on renal dialysis: Secondary | ICD-10-CM | POA: Diagnosis not present

## 2017-05-11 DIAGNOSIS — N2581 Secondary hyperparathyroidism of renal origin: Secondary | ICD-10-CM | POA: Diagnosis not present

## 2017-05-11 DIAGNOSIS — D631 Anemia in chronic kidney disease: Secondary | ICD-10-CM | POA: Diagnosis not present

## 2017-05-13 DIAGNOSIS — N186 End stage renal disease: Secondary | ICD-10-CM | POA: Diagnosis not present

## 2017-05-13 DIAGNOSIS — D631 Anemia in chronic kidney disease: Secondary | ICD-10-CM | POA: Diagnosis not present

## 2017-05-13 DIAGNOSIS — Z992 Dependence on renal dialysis: Secondary | ICD-10-CM | POA: Diagnosis not present

## 2017-05-13 DIAGNOSIS — D509 Iron deficiency anemia, unspecified: Secondary | ICD-10-CM | POA: Diagnosis not present

## 2017-05-13 DIAGNOSIS — N2581 Secondary hyperparathyroidism of renal origin: Secondary | ICD-10-CM | POA: Diagnosis not present

## 2017-05-15 DIAGNOSIS — N186 End stage renal disease: Secondary | ICD-10-CM | POA: Diagnosis not present

## 2017-05-15 DIAGNOSIS — E8770 Fluid overload, unspecified: Secondary | ICD-10-CM | POA: Diagnosis not present

## 2017-05-15 DIAGNOSIS — Z992 Dependence on renal dialysis: Secondary | ICD-10-CM | POA: Diagnosis not present

## 2017-05-16 DIAGNOSIS — N186 End stage renal disease: Secondary | ICD-10-CM | POA: Diagnosis not present

## 2017-05-16 DIAGNOSIS — D631 Anemia in chronic kidney disease: Secondary | ICD-10-CM | POA: Diagnosis not present

## 2017-05-16 DIAGNOSIS — N2581 Secondary hyperparathyroidism of renal origin: Secondary | ICD-10-CM | POA: Diagnosis not present

## 2017-05-16 DIAGNOSIS — D509 Iron deficiency anemia, unspecified: Secondary | ICD-10-CM | POA: Diagnosis not present

## 2017-05-16 DIAGNOSIS — Z992 Dependence on renal dialysis: Secondary | ICD-10-CM | POA: Diagnosis not present

## 2017-05-18 DIAGNOSIS — D631 Anemia in chronic kidney disease: Secondary | ICD-10-CM | POA: Diagnosis not present

## 2017-05-18 DIAGNOSIS — N186 End stage renal disease: Secondary | ICD-10-CM | POA: Diagnosis not present

## 2017-05-18 DIAGNOSIS — N2581 Secondary hyperparathyroidism of renal origin: Secondary | ICD-10-CM | POA: Diagnosis not present

## 2017-05-18 DIAGNOSIS — Z992 Dependence on renal dialysis: Secondary | ICD-10-CM | POA: Diagnosis not present

## 2017-05-18 DIAGNOSIS — D509 Iron deficiency anemia, unspecified: Secondary | ICD-10-CM | POA: Diagnosis not present

## 2017-05-20 DIAGNOSIS — N2581 Secondary hyperparathyroidism of renal origin: Secondary | ICD-10-CM | POA: Diagnosis not present

## 2017-05-20 DIAGNOSIS — D631 Anemia in chronic kidney disease: Secondary | ICD-10-CM | POA: Diagnosis not present

## 2017-05-20 DIAGNOSIS — Z992 Dependence on renal dialysis: Secondary | ICD-10-CM | POA: Diagnosis not present

## 2017-05-20 DIAGNOSIS — D509 Iron deficiency anemia, unspecified: Secondary | ICD-10-CM | POA: Diagnosis not present

## 2017-05-20 DIAGNOSIS — N186 End stage renal disease: Secondary | ICD-10-CM | POA: Diagnosis not present

## 2017-05-22 ENCOUNTER — Encounter (INDEPENDENT_AMBULATORY_CARE_PROVIDER_SITE_OTHER): Payer: Self-pay | Admitting: Orthopedic Surgery

## 2017-05-22 ENCOUNTER — Ambulatory Visit (INDEPENDENT_AMBULATORY_CARE_PROVIDER_SITE_OTHER): Payer: Medicare Other | Admitting: Orthopedic Surgery

## 2017-05-22 VITALS — Ht 70.0 in | Wt 210.0 lb

## 2017-05-22 DIAGNOSIS — B351 Tinea unguium: Secondary | ICD-10-CM | POA: Diagnosis not present

## 2017-05-22 DIAGNOSIS — E1142 Type 2 diabetes mellitus with diabetic polyneuropathy: Secondary | ICD-10-CM

## 2017-05-22 DIAGNOSIS — Z89511 Acquired absence of right leg below knee: Secondary | ICD-10-CM

## 2017-05-22 DIAGNOSIS — L97521 Non-pressure chronic ulcer of other part of left foot limited to breakdown of skin: Secondary | ICD-10-CM

## 2017-05-22 NOTE — Progress Notes (Signed)
Office Visit Note   Patient: Anthony Bullock           Date of Birth: 06/09/1960           MRN: [id number] Visit Date: 05/22/2017              Requested by: [name], MD [place], [place] [address] PCP: [name], MD  Chief Complaint  Patient presents with  . Left Foot - Follow-up    "black spot"  GT  . Right Leg - Follow-up    04/2016 right BKA      HPI: Patient is a 75-year gentleman who is seen for right transtibial amputation left foot with onychomycotic nails with a new black ulcer over the medial aspect of the left great toe.  Assessment & Plan: Visit Diagnoses:  1. Status post unilateral below knee amputation, right ([place])   2. Onychomycosis   3. Ulcer of toe of left foot, limited to breakdown of skin ([place])     Plan: Ulcer was debrided this had good healthy tissue no signs or symptoms of ischemic changes.  We will treat this conservatively.  Stable transtibial amputation and stable onychomycotic nails reevaluate in 3 months.  Follow-Up Instructions: Return in about 3 months (around 08/22/2017).   Ortho Exam  Patient is alert, oriented, no adenopathy, well-dressed, normal affect, normal respiratory effort. Examination patient has a well fitting prosthesis on the right.  There is no skin breakdowns no ulcers no calluses.  Examination the left foot is thickened discolored onychomycotic nails x5 he is unable to safely trim the nails on his own due to his diabetic insensate neuropathy and nails were trimmed x5 without complications.  He has a new black ulcer over the medial aspect of the great toe.  After informed consent a 10 blade knife was used to debride the skin and soft tissue back to healthy viable tissue there is no abscess no exposed bone or tendon no gangrenous changes.  Ulcer is 10 mm in diameter and 0.1 mm deep.  Patient does have a palpable dorsalis pedis pulse.  Imaging: No results found. No images are attached to the  encounter.  Labs: Lab Results  Component Value Date   HGBA1C 11.0 (H) 01/25/2017   HGBA1C 13.8 10/24/2016   HGBA1C 11.3 (H) 03/30/2016   ESRSEDRATE 94 (H) 03/30/2016   ESRSEDRATE 18 11/30/2012   CRP 16.1 (H) 03/30/2016   REPTSTATUS 04/05/2016 FINAL 03/31/2016   REPTSTATUS 04/04/2016 FINAL 03/31/2016   GRAMSTAIN  03/31/2016    RARE WBC PRESENT, PREDOMINANTLY PMN NO ORGANISMS SEEN    CULT NO ANAEROBES ISOLATED 03/31/2016   CULT  03/31/2016    RARE STENOTROPHOMONAS MALTOPHILIA RARE ENTEROBACTER SPECIES    LABORGA STENOTROPHOMONAS MALTOPHILIA 03/31/2016   LABORGA ENTEROBACTER SPECIES 03/31/2016     Lab Results  Component Value Date   ALBUMIN 3.2 (L) 01/25/2017   ALBUMIN 2.5 (L) 04/27/2016   ALBUMIN 2.3 (L) 04/04/2016    Body mass index is 30.13 kg/m.  Orders:  No orders of the defined types were placed in this encounter.  No orders of the defined types were placed in this encounter.    Procedures: No procedures performed  Clinical Data: No additional findings.  ROS:  All other systems negative, except as noted in the HPI. Review of Systems  Objective: Vital Signs: Ht _0  (1.778 m)   Wt 210 lb (95.3 kg)   BMI 30.13 kg/m   Specialty Comments:  No  specialty comments available.  PMFS History: Patient Active Problem List   Diagnosis Date Noted  . Onychomycosis 08/16/2016  . Status post unilateral below knee amputation, right ([place]) 04/26/2016  . Bilateral carotid bruits 03/30/2016  . Diabetes mellitus with complication ([place])   . Anemia due to chronic kidney disease   . ESRD (end stage renal disease) on dialysis ([place]) 11/30/2015  . Acute respiratory failure with hypoxia ([place]) 11/29/2015  . At high risk for falls 09/29/2014  . Peripheral vascular disease ([place]) 09/29/2014  . Osteoarthritis of both knees 01/07/2014  . Osteoarthritis of multiple joints 10/06/2013  . NICM (nonischemic cardiomyopathy) ([place]) 06/27/2013  . CAD (coronary artery disease)  03/13/2013  . Abnormal nuclear stress test 01/27/2013  . Chronic systolic congestive heart failure ([place]) 01/27/2013  . COPD (chronic obstructive pulmonary disease) ([place]) 12/27/2012  . Diabetic polyneuropathy associated with type 2 diabetes mellitus ([place]) 02/10/2012  . PYELITIS/PYELONEPHRITIS DISEASES CLASSIFIED ELSW 09/28/2009  . URINARY CALCULUS 09/28/2009  . Adjustment disorder with depressed mood 08/21/2009  . Poorly controlled type II diabetes mellitus with renal complication ([place]) 88/91/6945  . Hyperlipidemia 04/14/2008  . Essential hypertension 04/14/2008  . ALLERGIC RHINITIS 04/14/2008   Past Medical History:  Diagnosis Date  . 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 Allergic rhinitis   . Anemia   . Arthritis   . Chronic systolic heart failure ([place])    a. ECHO (12/2012) EF 25-30%, HK entireanteroseptal myocardium //  b.  EF 25%, diffuse HK, grade 1 diastolic dysfunction, MAC, mild LAE, normal RVSF, trivial pericardial effusion  . COPD (chronic obstructive pulmonary disease) ([place])   . Diabetes mellitus type II   . Diabetic nephropathy ([place])   . Diabetic neuropathy ([place])   . ESRD on hemodialysis [facility])    started HD June 2017, goes to [facility] HD unit, Dr Hinda Lenis  . History of cardiac catheterization    a.Myoview 1/15:  There is significant left ventricular dysfunction. There may be slight scar at the apex. There is no significant ischemia. LV Ejection Fraction: 27%  //  b. RHC/LHC (1/15) with mean RA 6, PA 47/22 mean 33, mean PCWP 20, PVR 2.5 WU, CI 2.5; 80% dLAD stenosis, 70% diffuse large D.   . History of kidney stones   . Hyperlipidemia   . Hypertension   . Kidney stones   . NICM (nonischemic cardiomyopathy) ([place])    Primarily nonischemic. Echo (12/14) with EF 25-30%. Echo (3/15) with EF 25%, mild to moderately dilated LV, normal RV size and systolic function.   . Pneumonia   . Urethral stricture     Family History  Problem  Relation Age of Onset  . Bladder Cancer Mother   . Alcohol abuse Father   . Melanoma Father   . Stroke Maternal Grandmother   . Heart Problems Maternal Grandmother        unknown  . Diabetes Maternal Grandmother   . Heart disease Maternal Grandfather   . Prostate cancer Maternal Grandfather     Past Surgical History:  Procedure Laterality Date  . ABDOMINAL AORTOGRAM W/LOWER EXTREMITY N/A 03/30/2016   Procedure: Abdominal Aortogram w/Lower Extremity;  Surgeon: Angelia Mould, MD;  Location: [facility];  Service: Cardiovascular;  Laterality: N/A;  . AMPUTATION Right 04/26/2016   Procedure: Right Below Knee Amputation;  Surgeon: Newt Minion, MD;  Location: [facility];  Service: Orthopedics;  Laterality: Right;  . AV FISTULA PLACEMENT Right 09/08/2015   Procedure: INSERTION OF 4-109m  x 45cm  ARTERIOVENOUS (AV) GORE-TEX GRAFT RIGHT UPPER  ARM;  Surgeon: Angelia Mould, MD;  Location: [facility];  Service: Vascular;  Laterality: Right;  . AV FISTULA PLACEMENT Left 01/14/2016   Procedure: CREATION OF LEFT UPPER ARM ARTERIOVENOUS FISTULA;  Surgeon: Angelia Mould, MD;  Location: [place];  Service: Vascular;  Laterality: Left;  . BASCILIC VEIN TRANSPOSITION Right 08/22/2014   Procedure: RIGHT UPPER ARM BASCILIC VEIN TRANSPOSITION;  Surgeon: Angelia Mould, MD;  Location: [facility];  Service: Vascular;  Laterality: Right;  . BELOW KNEE LEG AMPUTATION Right 04/26/2016  . CARDIAC CATHETERIZATION    . CARDIAC DEFIBRILLATOR PLACEMENT  06/27/2013   Sub Q       BY DR Caryl Comes  . COLONOSCOPY WITH PROPOFOL N/A 07/22/2015   Procedure: COLONOSCOPY WITH PROPOFOL;  Surgeon: Doran Stabler, MD;  [facility];  Service: Gastroenterology;  Laterality: N/A;  . FEMORAL-POPLITEAL BYPASS GRAFT Right 03/31/2016   Procedure: BYPASS GRAFT FEMORAL-POPLITEAL ARTERY USING RIGHT GREATER SAPHENOUS NONREVERSED VEIN;  Surgeon: Angelia Mould, MD;  Location: [place];  Service: Vascular;  Laterality:  Right;  . HERNIA REPAIR    . I&D EXTREMITY Right 03/31/2016   Procedure: IRRIGATION AND DEBRIDEMENT FOOT;  Surgeon: Angelia Mould, MD;  Location: [place];  Service: Vascular;  Laterality: Right;  . IMPLANTABLE CARDIOVERTER DEFIBRILLATOR IMPLANT N/A 06/27/2013   Procedure: SUB Q ICD;  Surgeon: Deboraha Sprang, MD;  Location: [facility] CATH LAB;  Service: Cardiovascular;  Laterality: N/A;  . INTRAOPERATIVE ARTERIOGRAM Right 03/31/2016   Procedure: INTRA OPERATIVE ARTERIOGRAM;  Surgeon: Angelia Mould, MD;  Location: [place];  Service: Vascular;  Laterality: Right;  . IR GENERIC HISTORICAL Right 11/30/2015   IR THROMBECTOMY AV FISTULA W/THROMBOLYSIS/PTA INC/SHUNT/IMG RIGHT 11/30/2015 Aletta Edouard, MD [facility]  . IR GENERIC HISTORICAL  11/30/2015   IR US GUIDE VASC ACCESS RIGHT 11/30/2015 Aletta Edouard, MD [facility]  . IR GENERIC HISTORICAL Right 12/15/2015   IR THROMBECTOMY AV FISTULA W/THROMBOLYSIS/PTA/STENT INC/SHUNT/IMG RT 12/15/2015 Arne Cleveland, MD [facility]  . IR GENERIC HISTORICAL  12/15/2015   IR US GUIDE VASC ACCESS RIGHT 12/15/2015 Arne Cleveland, MD [facility]  . IR GENERIC HISTORICAL  12/28/2015   IR FLUORO GUIDE CV LINE RIGHT 12/28/2015 Marybelle Killings, MD [facility]  . IR GENERIC HISTORICAL  12/28/2015   IR US GUIDE VASC ACCESS RIGHT 12/28/2015 Marybelle Killings, MD [facility]  . LEFT A ND RIGHT HEART CATH  01/30/2013   DR Sung Amabile  . LEFT AND RIGHT HEART CATHETERIZATION WITH CORONARY ANGIOGRAM N/A 01/30/2013   Procedure: LEFT AND RIGHT HEART CATHETERIZATION WITH CORONARY ANGIOGRAM;  Surgeon: Jolaine Artist, MD;  Location: [facility] CATH LAB;  Service: Cardiovascular;  Laterality: N/A;  . PERIPHERAL VASCULAR CATHETERIZATION Right 01/26/2015   Procedure: A/V Fistulagram;  Surgeon: Angelia Mould, MD;  Location: [facility];  Service: Cardiovascular;  Laterality: Right;  . reapea urethral surgery for recurrent obstruction  2011  . TOTAL KNEE ARTHROPLASTY  Right 2007  . VEIN HARVEST Right 03/31/2016   Procedure: RIGHT GREATER SAPHENOUS VEIN HARVEST;  Surgeon: Angelia Mould, MD;  Location: [place];  Service: Vascular;  Laterality: Right;   Social History   Occupational History  . Not on file  Tobacco Use  . Smoking status: Former Smoker    Packs/day: 2.00    Years: 32.00    Pack years: 64.00    Types: Cigarettes    Last attempt to quit: 05/11/2010  Years since quitting: 7.0  . Smokeless tobacco: Never Used  Substance and Sexual Activity  . Alcohol use: No  . Drug use: No  . Sexual activity: Yes

## 2017-05-23 DIAGNOSIS — N2581 Secondary hyperparathyroidism of renal origin: Secondary | ICD-10-CM | POA: Diagnosis not present

## 2017-05-23 DIAGNOSIS — D509 Iron deficiency anemia, unspecified: Secondary | ICD-10-CM | POA: Diagnosis not present

## 2017-05-23 DIAGNOSIS — Z992 Dependence on renal dialysis: Secondary | ICD-10-CM | POA: Diagnosis not present

## 2017-05-23 DIAGNOSIS — D631 Anemia in chronic kidney disease: Secondary | ICD-10-CM | POA: Diagnosis not present

## 2017-05-23 DIAGNOSIS — N186 End stage renal disease: Secondary | ICD-10-CM | POA: Diagnosis not present

## 2017-05-25 DIAGNOSIS — D631 Anemia in chronic kidney disease: Secondary | ICD-10-CM | POA: Diagnosis not present

## 2017-05-25 DIAGNOSIS — Z992 Dependence on renal dialysis: Secondary | ICD-10-CM | POA: Diagnosis not present

## 2017-05-25 DIAGNOSIS — N2581 Secondary hyperparathyroidism of renal origin: Secondary | ICD-10-CM | POA: Diagnosis not present

## 2017-05-25 DIAGNOSIS — D509 Iron deficiency anemia, unspecified: Secondary | ICD-10-CM | POA: Diagnosis not present

## 2017-05-25 DIAGNOSIS — N186 End stage renal disease: Secondary | ICD-10-CM | POA: Diagnosis not present

## 2017-05-27 DIAGNOSIS — D509 Iron deficiency anemia, unspecified: Secondary | ICD-10-CM | POA: Diagnosis not present

## 2017-05-27 DIAGNOSIS — D631 Anemia in chronic kidney disease: Secondary | ICD-10-CM | POA: Diagnosis not present

## 2017-05-27 DIAGNOSIS — Z992 Dependence on renal dialysis: Secondary | ICD-10-CM | POA: Diagnosis not present

## 2017-05-27 DIAGNOSIS — N2581 Secondary hyperparathyroidism of renal origin: Secondary | ICD-10-CM | POA: Diagnosis not present

## 2017-05-27 DIAGNOSIS — N186 End stage renal disease: Secondary | ICD-10-CM | POA: Diagnosis not present

## 2017-05-30 DIAGNOSIS — D631 Anemia in chronic kidney disease: Secondary | ICD-10-CM | POA: Diagnosis not present

## 2017-05-30 DIAGNOSIS — Z992 Dependence on renal dialysis: Secondary | ICD-10-CM | POA: Diagnosis not present

## 2017-05-30 DIAGNOSIS — N2581 Secondary hyperparathyroidism of renal origin: Secondary | ICD-10-CM | POA: Diagnosis not present

## 2017-05-30 DIAGNOSIS — N186 End stage renal disease: Secondary | ICD-10-CM | POA: Diagnosis not present

## 2017-05-30 DIAGNOSIS — D509 Iron deficiency anemia, unspecified: Secondary | ICD-10-CM | POA: Diagnosis not present

## 2017-06-01 DIAGNOSIS — N186 End stage renal disease: Secondary | ICD-10-CM | POA: Diagnosis not present

## 2017-06-01 DIAGNOSIS — D509 Iron deficiency anemia, unspecified: Secondary | ICD-10-CM | POA: Diagnosis not present

## 2017-06-01 DIAGNOSIS — Z992 Dependence on renal dialysis: Secondary | ICD-10-CM | POA: Diagnosis not present

## 2017-06-01 DIAGNOSIS — N2581 Secondary hyperparathyroidism of renal origin: Secondary | ICD-10-CM | POA: Diagnosis not present

## 2017-06-01 DIAGNOSIS — D631 Anemia in chronic kidney disease: Secondary | ICD-10-CM | POA: Diagnosis not present

## 2017-06-03 DIAGNOSIS — N186 End stage renal disease: Secondary | ICD-10-CM | POA: Diagnosis not present

## 2017-06-03 DIAGNOSIS — N2581 Secondary hyperparathyroidism of renal origin: Secondary | ICD-10-CM | POA: Diagnosis not present

## 2017-06-03 DIAGNOSIS — D509 Iron deficiency anemia, unspecified: Secondary | ICD-10-CM | POA: Diagnosis not present

## 2017-06-03 DIAGNOSIS — Z992 Dependence on renal dialysis: Secondary | ICD-10-CM | POA: Diagnosis not present

## 2017-06-03 DIAGNOSIS — D631 Anemia in chronic kidney disease: Secondary | ICD-10-CM | POA: Diagnosis not present

## 2017-06-06 DIAGNOSIS — D509 Iron deficiency anemia, unspecified: Secondary | ICD-10-CM | POA: Diagnosis not present

## 2017-06-06 DIAGNOSIS — N186 End stage renal disease: Secondary | ICD-10-CM | POA: Diagnosis not present

## 2017-06-06 DIAGNOSIS — N2581 Secondary hyperparathyroidism of renal origin: Secondary | ICD-10-CM | POA: Diagnosis not present

## 2017-06-06 DIAGNOSIS — Z992 Dependence on renal dialysis: Secondary | ICD-10-CM | POA: Diagnosis not present

## 2017-06-06 DIAGNOSIS — D631 Anemia in chronic kidney disease: Secondary | ICD-10-CM | POA: Diagnosis not present

## 2017-06-08 DIAGNOSIS — N2581 Secondary hyperparathyroidism of renal origin: Secondary | ICD-10-CM | POA: Diagnosis not present

## 2017-06-08 DIAGNOSIS — D631 Anemia in chronic kidney disease: Secondary | ICD-10-CM | POA: Diagnosis not present

## 2017-06-08 DIAGNOSIS — N186 End stage renal disease: Secondary | ICD-10-CM | POA: Diagnosis not present

## 2017-06-08 DIAGNOSIS — Z992 Dependence on renal dialysis: Secondary | ICD-10-CM | POA: Diagnosis not present

## 2017-06-08 DIAGNOSIS — D509 Iron deficiency anemia, unspecified: Secondary | ICD-10-CM | POA: Diagnosis not present

## 2017-06-09 DIAGNOSIS — Z992 Dependence on renal dialysis: Secondary | ICD-10-CM | POA: Diagnosis not present

## 2017-06-09 DIAGNOSIS — N186 End stage renal disease: Secondary | ICD-10-CM | POA: Diagnosis not present

## 2017-06-10 DIAGNOSIS — N2581 Secondary hyperparathyroidism of renal origin: Secondary | ICD-10-CM | POA: Diagnosis not present

## 2017-06-10 DIAGNOSIS — D509 Iron deficiency anemia, unspecified: Secondary | ICD-10-CM | POA: Diagnosis not present

## 2017-06-10 DIAGNOSIS — N186 End stage renal disease: Secondary | ICD-10-CM | POA: Diagnosis not present

## 2017-06-10 DIAGNOSIS — D631 Anemia in chronic kidney disease: Secondary | ICD-10-CM | POA: Diagnosis not present

## 2017-06-10 DIAGNOSIS — Z992 Dependence on renal dialysis: Secondary | ICD-10-CM | POA: Diagnosis not present

## 2017-06-13 DIAGNOSIS — D631 Anemia in chronic kidney disease: Secondary | ICD-10-CM | POA: Diagnosis not present

## 2017-06-13 DIAGNOSIS — N2581 Secondary hyperparathyroidism of renal origin: Secondary | ICD-10-CM | POA: Diagnosis not present

## 2017-06-13 DIAGNOSIS — Z992 Dependence on renal dialysis: Secondary | ICD-10-CM | POA: Diagnosis not present

## 2017-06-13 DIAGNOSIS — N186 End stage renal disease: Secondary | ICD-10-CM | POA: Diagnosis not present

## 2017-06-13 DIAGNOSIS — D509 Iron deficiency anemia, unspecified: Secondary | ICD-10-CM | POA: Diagnosis not present

## 2017-06-15 DIAGNOSIS — N186 End stage renal disease: Secondary | ICD-10-CM | POA: Diagnosis not present

## 2017-06-15 DIAGNOSIS — Z992 Dependence on renal dialysis: Secondary | ICD-10-CM | POA: Diagnosis not present

## 2017-06-15 DIAGNOSIS — N2581 Secondary hyperparathyroidism of renal origin: Secondary | ICD-10-CM | POA: Diagnosis not present

## 2017-06-15 DIAGNOSIS — D509 Iron deficiency anemia, unspecified: Secondary | ICD-10-CM | POA: Diagnosis not present

## 2017-06-15 DIAGNOSIS — D631 Anemia in chronic kidney disease: Secondary | ICD-10-CM | POA: Diagnosis not present

## 2017-06-17 DIAGNOSIS — D509 Iron deficiency anemia, unspecified: Secondary | ICD-10-CM | POA: Diagnosis not present

## 2017-06-17 DIAGNOSIS — N186 End stage renal disease: Secondary | ICD-10-CM | POA: Diagnosis not present

## 2017-06-17 DIAGNOSIS — D631 Anemia in chronic kidney disease: Secondary | ICD-10-CM | POA: Diagnosis not present

## 2017-06-17 DIAGNOSIS — Z992 Dependence on renal dialysis: Secondary | ICD-10-CM | POA: Diagnosis not present

## 2017-06-17 DIAGNOSIS — N2581 Secondary hyperparathyroidism of renal origin: Secondary | ICD-10-CM | POA: Diagnosis not present

## 2017-06-19 ENCOUNTER — Ambulatory Visit (INDEPENDENT_AMBULATORY_CARE_PROVIDER_SITE_OTHER): Payer: Medicare Other | Admitting: *Deleted

## 2017-06-19 DIAGNOSIS — I428 Other cardiomyopathies: Secondary | ICD-10-CM | POA: Diagnosis not present

## 2017-06-19 DIAGNOSIS — Z4502 Encounter for adjustment and management of automatic implantable cardiac defibrillator: Secondary | ICD-10-CM

## 2017-06-19 DIAGNOSIS — I5022 Chronic systolic (congestive) heart failure: Secondary | ICD-10-CM

## 2017-06-19 LAB — CUP PACEART INCLINIC DEVICE CHECK
Date Time Interrogation Session: 20190610105441
Implantable Pulse Generator Implant Date: 20150618
Pulse Gen Model: 1010
Pulse Gen Serial Number: 111255

## 2017-06-19 NOTE — Progress Notes (Signed)
Subcutaneous ICD check in clinic. 0 untreated episodes; 0 treated episodes; 0 shocks delivered. Electrode impedance status okay. No programming changes. Remaining longevity to ERI 41%. ROV with [facility] 09/18/17 and with Dr. Caryl Comes in December.

## 2017-06-20 DIAGNOSIS — N2581 Secondary hyperparathyroidism of renal origin: Secondary | ICD-10-CM | POA: Diagnosis not present

## 2017-06-20 DIAGNOSIS — D509 Iron deficiency anemia, unspecified: Secondary | ICD-10-CM | POA: Diagnosis not present

## 2017-06-20 DIAGNOSIS — D631 Anemia in chronic kidney disease: Secondary | ICD-10-CM | POA: Diagnosis not present

## 2017-06-20 DIAGNOSIS — Z992 Dependence on renal dialysis: Secondary | ICD-10-CM | POA: Diagnosis not present

## 2017-06-20 DIAGNOSIS — N186 End stage renal disease: Secondary | ICD-10-CM | POA: Diagnosis not present

## 2017-06-22 DIAGNOSIS — D509 Iron deficiency anemia, unspecified: Secondary | ICD-10-CM | POA: Diagnosis not present

## 2017-06-22 DIAGNOSIS — N2581 Secondary hyperparathyroidism of renal origin: Secondary | ICD-10-CM | POA: Diagnosis not present

## 2017-06-22 DIAGNOSIS — Z992 Dependence on renal dialysis: Secondary | ICD-10-CM | POA: Diagnosis not present

## 2017-06-22 DIAGNOSIS — N186 End stage renal disease: Secondary | ICD-10-CM | POA: Diagnosis not present

## 2017-06-22 DIAGNOSIS — D631 Anemia in chronic kidney disease: Secondary | ICD-10-CM | POA: Diagnosis not present

## 2017-06-24 DIAGNOSIS — N186 End stage renal disease: Secondary | ICD-10-CM | POA: Diagnosis not present

## 2017-06-24 DIAGNOSIS — D509 Iron deficiency anemia, unspecified: Secondary | ICD-10-CM | POA: Diagnosis not present

## 2017-06-24 DIAGNOSIS — D631 Anemia in chronic kidney disease: Secondary | ICD-10-CM | POA: Diagnosis not present

## 2017-06-24 DIAGNOSIS — Z992 Dependence on renal dialysis: Secondary | ICD-10-CM | POA: Diagnosis not present

## 2017-06-24 DIAGNOSIS — N2581 Secondary hyperparathyroidism of renal origin: Secondary | ICD-10-CM | POA: Diagnosis not present

## 2017-06-27 DIAGNOSIS — D509 Iron deficiency anemia, unspecified: Secondary | ICD-10-CM | POA: Diagnosis not present

## 2017-06-27 DIAGNOSIS — D631 Anemia in chronic kidney disease: Secondary | ICD-10-CM | POA: Diagnosis not present

## 2017-06-27 DIAGNOSIS — N2581 Secondary hyperparathyroidism of renal origin: Secondary | ICD-10-CM | POA: Diagnosis not present

## 2017-06-27 DIAGNOSIS — Z992 Dependence on renal dialysis: Secondary | ICD-10-CM | POA: Diagnosis not present

## 2017-06-27 DIAGNOSIS — N186 End stage renal disease: Secondary | ICD-10-CM | POA: Diagnosis not present

## 2017-06-29 DIAGNOSIS — D509 Iron deficiency anemia, unspecified: Secondary | ICD-10-CM | POA: Diagnosis not present

## 2017-06-29 DIAGNOSIS — Z992 Dependence on renal dialysis: Secondary | ICD-10-CM | POA: Diagnosis not present

## 2017-06-29 DIAGNOSIS — D631 Anemia in chronic kidney disease: Secondary | ICD-10-CM | POA: Diagnosis not present

## 2017-06-29 DIAGNOSIS — N2581 Secondary hyperparathyroidism of renal origin: Secondary | ICD-10-CM | POA: Diagnosis not present

## 2017-06-29 DIAGNOSIS — N186 End stage renal disease: Secondary | ICD-10-CM | POA: Diagnosis not present

## 2017-07-01 DIAGNOSIS — D509 Iron deficiency anemia, unspecified: Secondary | ICD-10-CM | POA: Diagnosis not present

## 2017-07-01 DIAGNOSIS — D631 Anemia in chronic kidney disease: Secondary | ICD-10-CM | POA: Diagnosis not present

## 2017-07-01 DIAGNOSIS — N2581 Secondary hyperparathyroidism of renal origin: Secondary | ICD-10-CM | POA: Diagnosis not present

## 2017-07-01 DIAGNOSIS — N186 End stage renal disease: Secondary | ICD-10-CM | POA: Diagnosis not present

## 2017-07-01 DIAGNOSIS — Z992 Dependence on renal dialysis: Secondary | ICD-10-CM | POA: Diagnosis not present

## 2017-07-04 DIAGNOSIS — N186 End stage renal disease: Secondary | ICD-10-CM | POA: Diagnosis not present

## 2017-07-04 DIAGNOSIS — Z794 Long term (current) use of insulin: Secondary | ICD-10-CM | POA: Diagnosis not present

## 2017-07-04 DIAGNOSIS — D631 Anemia in chronic kidney disease: Secondary | ICD-10-CM | POA: Diagnosis not present

## 2017-07-04 DIAGNOSIS — N2581 Secondary hyperparathyroidism of renal origin: Secondary | ICD-10-CM | POA: Diagnosis not present

## 2017-07-04 DIAGNOSIS — Z992 Dependence on renal dialysis: Secondary | ICD-10-CM | POA: Diagnosis not present

## 2017-07-04 DIAGNOSIS — D509 Iron deficiency anemia, unspecified: Secondary | ICD-10-CM | POA: Diagnosis not present

## 2017-07-04 DIAGNOSIS — E119 Type 2 diabetes mellitus without complications: Secondary | ICD-10-CM | POA: Diagnosis not present

## 2017-07-06 DIAGNOSIS — Z992 Dependence on renal dialysis: Secondary | ICD-10-CM | POA: Diagnosis not present

## 2017-07-06 DIAGNOSIS — D509 Iron deficiency anemia, unspecified: Secondary | ICD-10-CM | POA: Diagnosis not present

## 2017-07-06 DIAGNOSIS — D631 Anemia in chronic kidney disease: Secondary | ICD-10-CM | POA: Diagnosis not present

## 2017-07-06 DIAGNOSIS — N2581 Secondary hyperparathyroidism of renal origin: Secondary | ICD-10-CM | POA: Diagnosis not present

## 2017-07-06 DIAGNOSIS — N186 End stage renal disease: Secondary | ICD-10-CM | POA: Diagnosis not present

## 2017-07-08 DIAGNOSIS — D509 Iron deficiency anemia, unspecified: Secondary | ICD-10-CM | POA: Diagnosis not present

## 2017-07-08 DIAGNOSIS — N2581 Secondary hyperparathyroidism of renal origin: Secondary | ICD-10-CM | POA: Diagnosis not present

## 2017-07-08 DIAGNOSIS — N186 End stage renal disease: Secondary | ICD-10-CM | POA: Diagnosis not present

## 2017-07-08 DIAGNOSIS — D631 Anemia in chronic kidney disease: Secondary | ICD-10-CM | POA: Diagnosis not present

## 2017-07-08 DIAGNOSIS — Z992 Dependence on renal dialysis: Secondary | ICD-10-CM | POA: Diagnosis not present

## 2017-07-09 DIAGNOSIS — Z992 Dependence on renal dialysis: Secondary | ICD-10-CM | POA: Diagnosis not present

## 2017-07-09 DIAGNOSIS — N186 End stage renal disease: Secondary | ICD-10-CM | POA: Diagnosis not present

## 2017-07-11 DIAGNOSIS — Z992 Dependence on renal dialysis: Secondary | ICD-10-CM | POA: Diagnosis not present

## 2017-07-11 DIAGNOSIS — D509 Iron deficiency anemia, unspecified: Secondary | ICD-10-CM | POA: Diagnosis not present

## 2017-07-11 DIAGNOSIS — N2581 Secondary hyperparathyroidism of renal origin: Secondary | ICD-10-CM | POA: Diagnosis not present

## 2017-07-11 DIAGNOSIS — D631 Anemia in chronic kidney disease: Secondary | ICD-10-CM | POA: Diagnosis not present

## 2017-07-11 DIAGNOSIS — N186 End stage renal disease: Secondary | ICD-10-CM | POA: Diagnosis not present

## 2017-07-13 DIAGNOSIS — Z992 Dependence on renal dialysis: Secondary | ICD-10-CM | POA: Diagnosis not present

## 2017-07-13 DIAGNOSIS — D509 Iron deficiency anemia, unspecified: Secondary | ICD-10-CM | POA: Diagnosis not present

## 2017-07-13 DIAGNOSIS — D631 Anemia in chronic kidney disease: Secondary | ICD-10-CM | POA: Diagnosis not present

## 2017-07-13 DIAGNOSIS — N186 End stage renal disease: Secondary | ICD-10-CM | POA: Diagnosis not present

## 2017-07-13 DIAGNOSIS — N2581 Secondary hyperparathyroidism of renal origin: Secondary | ICD-10-CM | POA: Diagnosis not present

## 2017-07-15 DIAGNOSIS — D509 Iron deficiency anemia, unspecified: Secondary | ICD-10-CM | POA: Diagnosis not present

## 2017-07-15 DIAGNOSIS — Z992 Dependence on renal dialysis: Secondary | ICD-10-CM | POA: Diagnosis not present

## 2017-07-15 DIAGNOSIS — N2581 Secondary hyperparathyroidism of renal origin: Secondary | ICD-10-CM | POA: Diagnosis not present

## 2017-07-15 DIAGNOSIS — D631 Anemia in chronic kidney disease: Secondary | ICD-10-CM | POA: Diagnosis not present

## 2017-07-15 DIAGNOSIS — N186 End stage renal disease: Secondary | ICD-10-CM | POA: Diagnosis not present

## 2017-07-18 DIAGNOSIS — N186 End stage renal disease: Secondary | ICD-10-CM | POA: Diagnosis not present

## 2017-07-18 DIAGNOSIS — D509 Iron deficiency anemia, unspecified: Secondary | ICD-10-CM | POA: Diagnosis not present

## 2017-07-18 DIAGNOSIS — N2581 Secondary hyperparathyroidism of renal origin: Secondary | ICD-10-CM | POA: Diagnosis not present

## 2017-07-18 DIAGNOSIS — D631 Anemia in chronic kidney disease: Secondary | ICD-10-CM | POA: Diagnosis not present

## 2017-07-18 DIAGNOSIS — Z992 Dependence on renal dialysis: Secondary | ICD-10-CM | POA: Diagnosis not present

## 2017-07-20 ENCOUNTER — Telehealth: Payer: Self-pay | Admitting: Family Medicine

## 2017-07-20 DIAGNOSIS — D631 Anemia in chronic kidney disease: Secondary | ICD-10-CM | POA: Diagnosis not present

## 2017-07-20 DIAGNOSIS — D509 Iron deficiency anemia, unspecified: Secondary | ICD-10-CM | POA: Diagnosis not present

## 2017-07-20 DIAGNOSIS — Z992 Dependence on renal dialysis: Secondary | ICD-10-CM | POA: Diagnosis not present

## 2017-07-20 DIAGNOSIS — N2581 Secondary hyperparathyroidism of renal origin: Secondary | ICD-10-CM | POA: Diagnosis not present

## 2017-07-20 DIAGNOSIS — N186 End stage renal disease: Secondary | ICD-10-CM | POA: Diagnosis not present

## 2017-07-20 MED ORDER — INSULIN PEN NEEDLE 32G X 4 MM MISC: 1 refills | Status: DC

## 2017-07-20 NOTE — Telephone Encounter (Signed)
Copied from [place] [phone]. Topic: Quick Communication - See Telephone Encounter >> Jul 20, 2017  1:44 PM Conception Chancy, NT wrote: CRM for notification. See Telephone encounter for: 07/20/17.  Patient is requesting a prescription for Micropen needles ultra 42ml-32g   [facility] [phone] - [place], [place] [address] [place] [address] Phone: [phone] Fax: [phone]

## 2017-07-20 NOTE — Telephone Encounter (Signed)
Rx done. 

## 2017-07-22 DIAGNOSIS — Z992 Dependence on renal dialysis: Secondary | ICD-10-CM | POA: Diagnosis not present

## 2017-07-22 DIAGNOSIS — D509 Iron deficiency anemia, unspecified: Secondary | ICD-10-CM | POA: Diagnosis not present

## 2017-07-22 DIAGNOSIS — N2581 Secondary hyperparathyroidism of renal origin: Secondary | ICD-10-CM | POA: Diagnosis not present

## 2017-07-22 DIAGNOSIS — D631 Anemia in chronic kidney disease: Secondary | ICD-10-CM | POA: Diagnosis not present

## 2017-07-22 DIAGNOSIS — N186 End stage renal disease: Secondary | ICD-10-CM | POA: Diagnosis not present

## 2017-07-25 DIAGNOSIS — D509 Iron deficiency anemia, unspecified: Secondary | ICD-10-CM | POA: Diagnosis not present

## 2017-07-25 DIAGNOSIS — N186 End stage renal disease: Secondary | ICD-10-CM | POA: Diagnosis not present

## 2017-07-25 DIAGNOSIS — N2581 Secondary hyperparathyroidism of renal origin: Secondary | ICD-10-CM | POA: Diagnosis not present

## 2017-07-25 DIAGNOSIS — Z992 Dependence on renal dialysis: Secondary | ICD-10-CM | POA: Diagnosis not present

## 2017-07-25 DIAGNOSIS — D631 Anemia in chronic kidney disease: Secondary | ICD-10-CM | POA: Diagnosis not present

## 2017-07-27 DIAGNOSIS — N186 End stage renal disease: Secondary | ICD-10-CM | POA: Diagnosis not present

## 2017-07-27 DIAGNOSIS — D509 Iron deficiency anemia, unspecified: Secondary | ICD-10-CM | POA: Diagnosis not present

## 2017-07-27 DIAGNOSIS — N2581 Secondary hyperparathyroidism of renal origin: Secondary | ICD-10-CM | POA: Diagnosis not present

## 2017-07-27 DIAGNOSIS — Z992 Dependence on renal dialysis: Secondary | ICD-10-CM | POA: Diagnosis not present

## 2017-07-27 DIAGNOSIS — D631 Anemia in chronic kidney disease: Secondary | ICD-10-CM | POA: Diagnosis not present

## 2017-07-29 DIAGNOSIS — J90 Pleural effusion, not elsewhere classified: Secondary | ICD-10-CM | POA: Diagnosis not present

## 2017-07-29 DIAGNOSIS — Z79899 Other long term (current) drug therapy: Secondary | ICD-10-CM | POA: Diagnosis not present

## 2017-07-29 DIAGNOSIS — N186 End stage renal disease: Secondary | ICD-10-CM | POA: Diagnosis not present

## 2017-07-29 DIAGNOSIS — Z7982 Long term (current) use of aspirin: Secondary | ICD-10-CM | POA: Diagnosis not present

## 2017-07-29 DIAGNOSIS — E871 Hypo-osmolality and hyponatremia: Secondary | ICD-10-CM | POA: Diagnosis not present

## 2017-07-29 DIAGNOSIS — J449 Chronic obstructive pulmonary disease, unspecified: Secondary | ICD-10-CM | POA: Diagnosis not present

## 2017-07-29 DIAGNOSIS — E1122 Type 2 diabetes mellitus with diabetic chronic kidney disease: Secondary | ICD-10-CM | POA: Diagnosis not present

## 2017-07-29 DIAGNOSIS — Z89511 Acquired absence of right leg below knee: Secondary | ICD-10-CM | POA: Diagnosis not present

## 2017-07-29 DIAGNOSIS — I132 Hypertensive heart and chronic kidney disease with heart failure and with stage 5 chronic kidney disease, or end stage renal disease: Secondary | ICD-10-CM | POA: Diagnosis not present

## 2017-07-29 DIAGNOSIS — J80 Acute respiratory distress syndrome: Secondary | ICD-10-CM | POA: Diagnosis not present

## 2017-07-29 DIAGNOSIS — D631 Anemia in chronic kidney disease: Secondary | ICD-10-CM | POA: Diagnosis not present

## 2017-07-29 DIAGNOSIS — R0902 Hypoxemia: Secondary | ICD-10-CM | POA: Diagnosis not present

## 2017-07-29 DIAGNOSIS — Z794 Long term (current) use of insulin: Secondary | ICD-10-CM | POA: Diagnosis not present

## 2017-07-29 DIAGNOSIS — E7800 Pure hypercholesterolemia, unspecified: Secondary | ICD-10-CM | POA: Diagnosis not present

## 2017-07-29 DIAGNOSIS — I509 Heart failure, unspecified: Secondary | ICD-10-CM | POA: Diagnosis not present

## 2017-07-29 DIAGNOSIS — Z87891 Personal history of nicotine dependence: Secondary | ICD-10-CM | POA: Diagnosis not present

## 2017-07-29 DIAGNOSIS — N189 Chronic kidney disease, unspecified: Secondary | ICD-10-CM | POA: Diagnosis not present

## 2017-07-29 DIAGNOSIS — R000 Tachycardia, unspecified: Secondary | ICD-10-CM | POA: Diagnosis not present

## 2017-07-29 DIAGNOSIS — R0689 Other abnormalities of breathing: Secondary | ICD-10-CM | POA: Diagnosis not present

## 2017-07-29 DIAGNOSIS — D509 Iron deficiency anemia, unspecified: Secondary | ICD-10-CM | POA: Diagnosis not present

## 2017-07-29 DIAGNOSIS — N2581 Secondary hyperparathyroidism of renal origin: Secondary | ICD-10-CM | POA: Diagnosis not present

## 2017-07-29 DIAGNOSIS — I10 Essential (primary) hypertension: Secondary | ICD-10-CM | POA: Diagnosis not present

## 2017-07-29 DIAGNOSIS — Z992 Dependence on renal dialysis: Secondary | ICD-10-CM | POA: Diagnosis not present

## 2017-07-30 DIAGNOSIS — I132 Hypertensive heart and chronic kidney disease with heart failure and with stage 5 chronic kidney disease, or end stage renal disease: Secondary | ICD-10-CM | POA: Diagnosis not present

## 2017-07-30 DIAGNOSIS — I10 Essential (primary) hypertension: Secondary | ICD-10-CM | POA: Diagnosis not present

## 2017-07-30 DIAGNOSIS — Z79899 Other long term (current) drug therapy: Secondary | ICD-10-CM | POA: Diagnosis not present

## 2017-07-30 DIAGNOSIS — J440 Chronic obstructive pulmonary disease with acute lower respiratory infection: Secondary | ICD-10-CM | POA: Diagnosis not present

## 2017-07-30 DIAGNOSIS — Z794 Long term (current) use of insulin: Secondary | ICD-10-CM | POA: Diagnosis not present

## 2017-07-30 DIAGNOSIS — N186 End stage renal disease: Secondary | ICD-10-CM | POA: Diagnosis not present

## 2017-07-30 DIAGNOSIS — E1122 Type 2 diabetes mellitus with diabetic chronic kidney disease: Secondary | ICD-10-CM | POA: Diagnosis not present

## 2017-07-30 DIAGNOSIS — Z7982 Long term (current) use of aspirin: Secondary | ICD-10-CM | POA: Diagnosis not present

## 2017-07-30 DIAGNOSIS — Z95810 Presence of automatic (implantable) cardiac defibrillator: Secondary | ICD-10-CM | POA: Diagnosis not present

## 2017-07-30 DIAGNOSIS — I509 Heart failure, unspecified: Secondary | ICD-10-CM | POA: Diagnosis not present

## 2017-07-30 DIAGNOSIS — J209 Acute bronchitis, unspecified: Secondary | ICD-10-CM | POA: Diagnosis not present

## 2017-07-30 DIAGNOSIS — Z992 Dependence on renal dialysis: Secondary | ICD-10-CM | POA: Diagnosis not present

## 2017-07-30 DIAGNOSIS — E7800 Pure hypercholesterolemia, unspecified: Secondary | ICD-10-CM | POA: Diagnosis not present

## 2017-07-30 DIAGNOSIS — R05 Cough: Secondary | ICD-10-CM | POA: Diagnosis not present

## 2017-07-30 DIAGNOSIS — Z87891 Personal history of nicotine dependence: Secondary | ICD-10-CM | POA: Diagnosis not present

## 2017-08-01 DIAGNOSIS — D509 Iron deficiency anemia, unspecified: Secondary | ICD-10-CM | POA: Diagnosis not present

## 2017-08-01 DIAGNOSIS — N2581 Secondary hyperparathyroidism of renal origin: Secondary | ICD-10-CM | POA: Diagnosis not present

## 2017-08-01 DIAGNOSIS — D631 Anemia in chronic kidney disease: Secondary | ICD-10-CM | POA: Diagnosis not present

## 2017-08-01 DIAGNOSIS — N186 End stage renal disease: Secondary | ICD-10-CM | POA: Diagnosis not present

## 2017-08-01 DIAGNOSIS — Z992 Dependence on renal dialysis: Secondary | ICD-10-CM | POA: Diagnosis not present

## 2017-08-03 DIAGNOSIS — D631 Anemia in chronic kidney disease: Secondary | ICD-10-CM | POA: Diagnosis not present

## 2017-08-03 DIAGNOSIS — Z992 Dependence on renal dialysis: Secondary | ICD-10-CM | POA: Diagnosis not present

## 2017-08-03 DIAGNOSIS — N186 End stage renal disease: Secondary | ICD-10-CM | POA: Diagnosis not present

## 2017-08-03 DIAGNOSIS — N2581 Secondary hyperparathyroidism of renal origin: Secondary | ICD-10-CM | POA: Diagnosis not present

## 2017-08-03 DIAGNOSIS — D509 Iron deficiency anemia, unspecified: Secondary | ICD-10-CM | POA: Diagnosis not present

## 2017-08-05 DIAGNOSIS — D509 Iron deficiency anemia, unspecified: Secondary | ICD-10-CM | POA: Diagnosis not present

## 2017-08-05 DIAGNOSIS — Z992 Dependence on renal dialysis: Secondary | ICD-10-CM | POA: Diagnosis not present

## 2017-08-05 DIAGNOSIS — N186 End stage renal disease: Secondary | ICD-10-CM | POA: Diagnosis not present

## 2017-08-05 DIAGNOSIS — N2581 Secondary hyperparathyroidism of renal origin: Secondary | ICD-10-CM | POA: Diagnosis not present

## 2017-08-05 DIAGNOSIS — D631 Anemia in chronic kidney disease: Secondary | ICD-10-CM | POA: Diagnosis not present

## 2017-08-08 DIAGNOSIS — D509 Iron deficiency anemia, unspecified: Secondary | ICD-10-CM | POA: Diagnosis not present

## 2017-08-08 DIAGNOSIS — N2581 Secondary hyperparathyroidism of renal origin: Secondary | ICD-10-CM | POA: Diagnosis not present

## 2017-08-08 DIAGNOSIS — D631 Anemia in chronic kidney disease: Secondary | ICD-10-CM | POA: Diagnosis not present

## 2017-08-08 DIAGNOSIS — Z992 Dependence on renal dialysis: Secondary | ICD-10-CM | POA: Diagnosis not present

## 2017-08-08 DIAGNOSIS — N186 End stage renal disease: Secondary | ICD-10-CM | POA: Diagnosis not present

## 2017-08-09 DIAGNOSIS — N186 End stage renal disease: Secondary | ICD-10-CM | POA: Diagnosis not present

## 2017-08-09 DIAGNOSIS — Z992 Dependence on renal dialysis: Secondary | ICD-10-CM | POA: Diagnosis not present

## 2017-08-10 DIAGNOSIS — D509 Iron deficiency anemia, unspecified: Secondary | ICD-10-CM | POA: Diagnosis not present

## 2017-08-10 DIAGNOSIS — Z992 Dependence on renal dialysis: Secondary | ICD-10-CM | POA: Diagnosis not present

## 2017-08-10 DIAGNOSIS — D631 Anemia in chronic kidney disease: Secondary | ICD-10-CM | POA: Diagnosis not present

## 2017-08-10 DIAGNOSIS — N2581 Secondary hyperparathyroidism of renal origin: Secondary | ICD-10-CM | POA: Diagnosis not present

## 2017-08-10 DIAGNOSIS — N186 End stage renal disease: Secondary | ICD-10-CM | POA: Diagnosis not present

## 2017-08-12 DIAGNOSIS — Z992 Dependence on renal dialysis: Secondary | ICD-10-CM | POA: Diagnosis not present

## 2017-08-12 DIAGNOSIS — D509 Iron deficiency anemia, unspecified: Secondary | ICD-10-CM | POA: Diagnosis not present

## 2017-08-12 DIAGNOSIS — N186 End stage renal disease: Secondary | ICD-10-CM | POA: Diagnosis not present

## 2017-08-12 DIAGNOSIS — N2581 Secondary hyperparathyroidism of renal origin: Secondary | ICD-10-CM | POA: Diagnosis not present

## 2017-08-12 DIAGNOSIS — D631 Anemia in chronic kidney disease: Secondary | ICD-10-CM | POA: Diagnosis not present

## 2017-08-15 DIAGNOSIS — Z992 Dependence on renal dialysis: Secondary | ICD-10-CM | POA: Diagnosis not present

## 2017-08-15 DIAGNOSIS — N186 End stage renal disease: Secondary | ICD-10-CM | POA: Diagnosis not present

## 2017-08-15 DIAGNOSIS — N2581 Secondary hyperparathyroidism of renal origin: Secondary | ICD-10-CM | POA: Diagnosis not present

## 2017-08-15 DIAGNOSIS — D509 Iron deficiency anemia, unspecified: Secondary | ICD-10-CM | POA: Diagnosis not present

## 2017-08-15 DIAGNOSIS — D631 Anemia in chronic kidney disease: Secondary | ICD-10-CM | POA: Diagnosis not present

## 2017-08-17 DIAGNOSIS — N2581 Secondary hyperparathyroidism of renal origin: Secondary | ICD-10-CM | POA: Diagnosis not present

## 2017-08-17 DIAGNOSIS — D509 Iron deficiency anemia, unspecified: Secondary | ICD-10-CM | POA: Diagnosis not present

## 2017-08-17 DIAGNOSIS — Z992 Dependence on renal dialysis: Secondary | ICD-10-CM | POA: Diagnosis not present

## 2017-08-17 DIAGNOSIS — N186 End stage renal disease: Secondary | ICD-10-CM | POA: Diagnosis not present

## 2017-08-17 DIAGNOSIS — D631 Anemia in chronic kidney disease: Secondary | ICD-10-CM | POA: Diagnosis not present

## 2017-08-19 DIAGNOSIS — D631 Anemia in chronic kidney disease: Secondary | ICD-10-CM | POA: Diagnosis not present

## 2017-08-19 DIAGNOSIS — N2581 Secondary hyperparathyroidism of renal origin: Secondary | ICD-10-CM | POA: Diagnosis not present

## 2017-08-19 DIAGNOSIS — Z992 Dependence on renal dialysis: Secondary | ICD-10-CM | POA: Diagnosis not present

## 2017-08-19 DIAGNOSIS — N186 End stage renal disease: Secondary | ICD-10-CM | POA: Diagnosis not present

## 2017-08-19 DIAGNOSIS — D509 Iron deficiency anemia, unspecified: Secondary | ICD-10-CM | POA: Diagnosis not present

## 2017-08-21 ENCOUNTER — Encounter (INDEPENDENT_AMBULATORY_CARE_PROVIDER_SITE_OTHER): Payer: Self-pay | Admitting: Orthopedic Surgery

## 2017-08-21 ENCOUNTER — Ambulatory Visit (INDEPENDENT_AMBULATORY_CARE_PROVIDER_SITE_OTHER): Payer: Medicare Other | Admitting: Orthopedic Surgery

## 2017-08-21 VITALS — Ht 70.0 in | Wt 210.0 lb

## 2017-08-21 DIAGNOSIS — Z89511 Acquired absence of right leg below knee: Secondary | ICD-10-CM | POA: Diagnosis not present

## 2017-08-21 DIAGNOSIS — E1142 Type 2 diabetes mellitus with diabetic polyneuropathy: Secondary | ICD-10-CM

## 2017-08-21 DIAGNOSIS — B351 Tinea unguium: Secondary | ICD-10-CM | POA: Diagnosis not present

## 2017-08-22 DIAGNOSIS — N2581 Secondary hyperparathyroidism of renal origin: Secondary | ICD-10-CM | POA: Diagnosis not present

## 2017-08-22 DIAGNOSIS — D631 Anemia in chronic kidney disease: Secondary | ICD-10-CM | POA: Diagnosis not present

## 2017-08-22 DIAGNOSIS — Z992 Dependence on renal dialysis: Secondary | ICD-10-CM | POA: Diagnosis not present

## 2017-08-22 DIAGNOSIS — N186 End stage renal disease: Secondary | ICD-10-CM | POA: Diagnosis not present

## 2017-08-22 DIAGNOSIS — D509 Iron deficiency anemia, unspecified: Secondary | ICD-10-CM | POA: Diagnosis not present

## 2017-08-24 DIAGNOSIS — N2581 Secondary hyperparathyroidism of renal origin: Secondary | ICD-10-CM | POA: Diagnosis not present

## 2017-08-24 DIAGNOSIS — D509 Iron deficiency anemia, unspecified: Secondary | ICD-10-CM | POA: Diagnosis not present

## 2017-08-24 DIAGNOSIS — D631 Anemia in chronic kidney disease: Secondary | ICD-10-CM | POA: Diagnosis not present

## 2017-08-24 DIAGNOSIS — Z992 Dependence on renal dialysis: Secondary | ICD-10-CM | POA: Diagnosis not present

## 2017-08-24 DIAGNOSIS — N186 End stage renal disease: Secondary | ICD-10-CM | POA: Diagnosis not present

## 2017-08-26 DIAGNOSIS — Z992 Dependence on renal dialysis: Secondary | ICD-10-CM | POA: Diagnosis not present

## 2017-08-26 DIAGNOSIS — D631 Anemia in chronic kidney disease: Secondary | ICD-10-CM | POA: Diagnosis not present

## 2017-08-26 DIAGNOSIS — N186 End stage renal disease: Secondary | ICD-10-CM | POA: Diagnosis not present

## 2017-08-26 DIAGNOSIS — D509 Iron deficiency anemia, unspecified: Secondary | ICD-10-CM | POA: Diagnosis not present

## 2017-08-26 DIAGNOSIS — N2581 Secondary hyperparathyroidism of renal origin: Secondary | ICD-10-CM | POA: Diagnosis not present

## 2017-08-29 DIAGNOSIS — Z992 Dependence on renal dialysis: Secondary | ICD-10-CM | POA: Diagnosis not present

## 2017-08-29 DIAGNOSIS — D631 Anemia in chronic kidney disease: Secondary | ICD-10-CM | POA: Diagnosis not present

## 2017-08-29 DIAGNOSIS — N2581 Secondary hyperparathyroidism of renal origin: Secondary | ICD-10-CM | POA: Diagnosis not present

## 2017-08-29 DIAGNOSIS — D509 Iron deficiency anemia, unspecified: Secondary | ICD-10-CM | POA: Diagnosis not present

## 2017-08-29 DIAGNOSIS — N186 End stage renal disease: Secondary | ICD-10-CM | POA: Diagnosis not present

## 2017-08-31 DIAGNOSIS — Z992 Dependence on renal dialysis: Secondary | ICD-10-CM | POA: Diagnosis not present

## 2017-08-31 DIAGNOSIS — D631 Anemia in chronic kidney disease: Secondary | ICD-10-CM | POA: Diagnosis not present

## 2017-08-31 DIAGNOSIS — N186 End stage renal disease: Secondary | ICD-10-CM | POA: Diagnosis not present

## 2017-08-31 DIAGNOSIS — N2581 Secondary hyperparathyroidism of renal origin: Secondary | ICD-10-CM | POA: Diagnosis not present

## 2017-08-31 DIAGNOSIS — D509 Iron deficiency anemia, unspecified: Secondary | ICD-10-CM | POA: Diagnosis not present

## 2017-08-31 IMAGING — US IR US GUIDE VASC ACCESS RIGHT
1 series · 1 of 1 positions shown · non-contrast
Comparison: none

INDICATION: Renal failure

[Series 1: ir fluoro/shunt/fist · 1 of 1 slices shown]
[im 1/1]
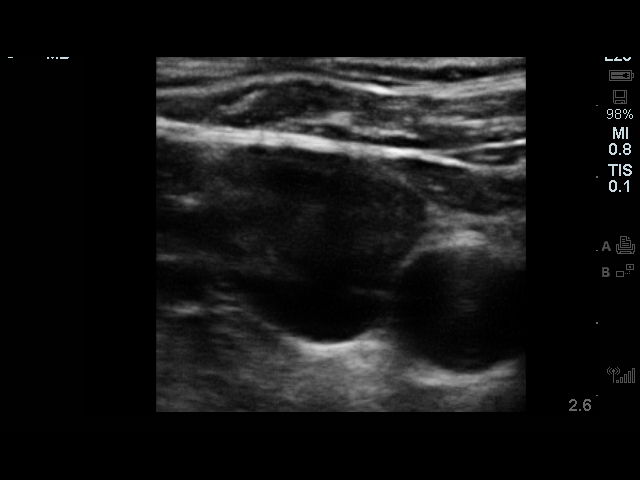

[1 of 1 positions shown; findings below may reference images not displayed]

EXAM:
TUNNELED DIALYSIS CATHETER PLACEMENT, ULTRASOUND GUIDANCE FOR
VASCULAR ACCESS

MEDICATIONS:
Ancef.

ANESTHESIA/SEDATION:
Versed 1 mg IV; Fentanyl 50 mcg IV;

Moderate Sedation Time:  16

The patient was continuously monitored during the procedure by the
interventional radiology nurse under my direct supervision.

FLUOROSCOPY TIME:  Fluoroscopy Time:  minutes 36 seconds (4 mGy).

COMPLICATIONS:
None immediate.

PROCEDURE:
Informed written consent was obtained from the patient after a
thorough discussion of the procedural risks, benefits and
alternatives. All questions were addressed. Maximal Sterile Barrier
Technique was utilized including caps, mask, sterile gowns, sterile
gloves, sterile drape, hand hygiene and skin antiseptic. A timeout
was performed prior to the initiation of the procedure.

The right neck was prepped with ChloraPrep in a sterile fashion, and
a sterile drape was applied covering the operative field. A sterile
gown and sterile gloves were used for the procedure. 1% lidocaine
into the skin and subcutaneous tissue. The jugular vein was noted to
be patent initially with ultrasound. Under sonographic guidance, a
micropuncture needle was inserted into the right IJ vein (Ultrasound
and fluoroscopic image documentation was performed). It was removed
over an 018 wire which was up-sized to an Amplatz. This was advanced
into the IVC.

A small incision was made in the right upper chest. The tunneling
device was utilized to advance the 23 centimeter tip to cuff
catheter from the chest incision and out the neck incision. A
peel-away sheath was advanced over the Amplatz wire. The leading
edge of the catheter was then advanced through the peel-away sheath.
The peel-away sheath was removed. It was flushed and instilled with
heparin. The chest incision was closed with a 0 Prolene pursestring
stitch. The neck incision was closed with a 4-0 Vicryl subcuticular
stitch.
IMPRESSION: Successful right IJ vein tunneled dialysis catheter with its tip in
the right atrium.

## 2017-09-02 DIAGNOSIS — N186 End stage renal disease: Secondary | ICD-10-CM | POA: Diagnosis not present

## 2017-09-02 DIAGNOSIS — Z992 Dependence on renal dialysis: Secondary | ICD-10-CM | POA: Diagnosis not present

## 2017-09-02 DIAGNOSIS — D509 Iron deficiency anemia, unspecified: Secondary | ICD-10-CM | POA: Diagnosis not present

## 2017-09-02 DIAGNOSIS — D631 Anemia in chronic kidney disease: Secondary | ICD-10-CM | POA: Diagnosis not present

## 2017-09-02 DIAGNOSIS — N2581 Secondary hyperparathyroidism of renal origin: Secondary | ICD-10-CM | POA: Diagnosis not present

## 2017-09-04 ENCOUNTER — Encounter (INDEPENDENT_AMBULATORY_CARE_PROVIDER_SITE_OTHER): Payer: Self-pay | Admitting: Orthopedic Surgery

## 2017-09-04 NOTE — Progress Notes (Signed)
Office Visit Note   Patient: Anthony Bullock           Date of Birth: 05/14/1960           MRN: [id number] Visit Date: 08/21/2017              Requested by: [name], MD [place], [place] [address] PCP: [name], MD  Chief Complaint  Patient presents with  . Right Leg - Follow-up    04/26/16 right BKA      HPI: Patient is a 57 year old gentleman who about 16 months status post right transtibial amputation.  Patient complains of dermatitis in both legs he has been using Shea butter.  Patient complains of painful onychomycotic nails x5 in the left foot.  He currently ambulates with a cane with his prosthesis.  Assessment & Plan: Visit Diagnoses:  1. Status post unilateral below knee amputation, right ([place])   2. Onychomycosis   3. Diabetic polyneuropathy associated with type 2 diabetes mellitus (HCC)     Plan: Nails were trimmed x5 without complications.  Recommended continuing with the shave butter continue with the stump shrinker follow-up with biotech for any modifications necessary for the prosthesis.  Follow-Up Instructions: Return in about 3 months (around 11/21/2017).   Ortho Exam  Patient is alert, oriented, no adenopathy, well-dressed, normal affect, normal respiratory effort. Examination patient has an antalgic gait and uses a cane for ambulation.  He has no breakdown of the residual limb for the right transtibial amputation.  Examination of the left foot he has no plantar ulcers no venous stasis ulcers he has thickened discolored onychomycotic nails x5 he is unable to safely trim the nails on his own and the nails are trimmed x5 without complications.  Imaging: No results found. No images are attached to the encounter.  Labs: Lab Results  Component Value Date   HGBA1C 11.0 (H) 01/25/2017   HGBA1C 13.8 10/24/2016   HGBA1C 11.3 (H) 03/30/2016   ESRSEDRATE 94 (H) 03/30/2016   ESRSEDRATE 18 11/30/2012   CRP 16.1 (H) 03/30/2016    REPTSTATUS 04/05/2016 FINAL 03/31/2016   REPTSTATUS 04/04/2016 FINAL 03/31/2016   GRAMSTAIN  03/31/2016    RARE WBC PRESENT, PREDOMINANTLY PMN NO ORGANISMS SEEN    CULT NO ANAEROBES ISOLATED 03/31/2016   CULT  03/31/2016    RARE STENOTROPHOMONAS MALTOPHILIA RARE ENTEROBACTER SPECIES    LABORGA STENOTROPHOMONAS MALTOPHILIA 03/31/2016   LABORGA ENTEROBACTER SPECIES 03/31/2016     Lab Results  Component Value Date   ALBUMIN 3.2 (L) 01/25/2017   ALBUMIN 2.5 (L) 04/27/2016   ALBUMIN 2.3 (L) 04/04/2016    Body mass index is 30.13 kg/m.  Orders:  No orders of the defined types were placed in this encounter.  No orders of the defined types were placed in this encounter.    Procedures: No procedures performed  Clinical Data: No additional findings.  ROS:  All other systems negative, except as noted in the HPI. Review of Systems  Objective: Vital Signs: Ht _0  (1.778 m)   Wt 210 lb (95.3 kg)   BMI 30.13 kg/m   Specialty Comments:  No specialty comments available.  PMFS History: Patient Active Problem List   Diagnosis Date Noted  . Onychomycosis 08/16/2016  . Status post unilateral below knee amputation, right ([place]) 04/26/2016  . Bilateral carotid bruits 03/30/2016  . Diabetes mellitus with complication ([place])   . Anemia due to chronic kidney disease   . ESRD (end stage renal  disease) on dialysis () 11/30/2015  . Acute respiratory failure with hypoxia ([place]) 11/29/2015  . At high risk for falls 09/29/2014  . Peripheral vascular disease ([place]) 09/29/2014  . Osteoarthritis of both knees 01/07/2014  . Osteoarthritis of multiple joints 10/06/2013  . NICM (nonischemic cardiomyopathy) ([place]) 06/27/2013  . CAD (coronary artery disease) 03/13/2013  . Abnormal nuclear stress test 01/27/2013  . Chronic systolic congestive heart failure ([place]) 01/27/2013  . COPD (chronic obstructive pulmonary disease) ([place]) 12/27/2012  . Diabetic polyneuropathy associated with  type 2 diabetes mellitus ([place]) 02/10/2012  . PYELITIS/PYELONEPHRITIS DISEASES CLASSIFIED ELSW 09/28/2009  . URINARY CALCULUS 09/28/2009  . Adjustment disorder with depressed mood 08/21/2009  . Poorly controlled type II diabetes mellitus with renal complication ([place]) 76/16/0737  . Hyperlipidemia 04/14/2008  . Essential hypertension 04/14/2008  . ALLERGIC RHINITIS 04/14/2008   Past Medical History:  Diagnosis Date  . 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 Allergic rhinitis   . Anemia   . Arthritis   . Chronic systolic heart failure ([place])    a. ECHO (12/2012) EF 25-30%, HK entireanteroseptal myocardium //  b.  EF 25%, diffuse HK, grade 1 diastolic dysfunction, MAC, mild LAE, normal RVSF, trivial pericardial effusion  . COPD (chronic obstructive pulmonary disease) ([place])   . Diabetes mellitus type II   . Diabetic nephropathy ([place])   . Diabetic neuropathy ([place])   . ESRD on hemodialysis [facility])    started HD June 2017, goes to [facility] HD unit, Dr Hinda Lenis  . History of cardiac catheterization    a.Myoview 1/15:  There is significant left ventricular dysfunction. There may be slight scar at the apex. There is no significant ischemia. LV Ejection Fraction: 27%  //  b. RHC/LHC (1/15) with mean RA 6, PA 47/22 mean 33, mean PCWP 20, PVR 2.5 WU, CI 2.5; 80% dLAD stenosis, 70% diffuse large D.   . History of kidney stones   . Hyperlipidemia   . Hypertension   . Kidney stones   . NICM (nonischemic cardiomyopathy) ([place])    Primarily nonischemic. Echo (12/14) with EF 25-30%. Echo (3/15) with EF 25%, mild to moderately dilated LV, normal RV size and systolic function.   . Pneumonia   . Urethral stricture     Family History  Problem Relation Age of Onset  . Bladder Cancer Mother   . Alcohol abuse Father   . Melanoma Father   . Stroke Maternal Grandmother   . Heart Problems Maternal Grandmother        unknown  . Diabetes Maternal Grandmother   .  Heart disease Maternal Grandfather   . Prostate cancer Maternal Grandfather     Past Surgical History:  Procedure Laterality Date  . ABDOMINAL AORTOGRAM W/LOWER EXTREMITY N/A 03/30/2016   Procedure: Abdominal Aortogram w/Lower Extremity;  Surgeon: Angelia Mould, MD;  Location: [facility];  Service: Cardiovascular;  Laterality: N/A;  . AMPUTATION Right 04/26/2016   Procedure: Right Below Knee Amputation;  Surgeon: Newt Minion, MD;  Location: [facility];  Service: Orthopedics;  Laterality: Right;  . AV FISTULA PLACEMENT Right 09/08/2015   Procedure: INSERTION OF 4-25m x 45cm  ARTERIOVENOUS (AV) GORE-TEX GRAFT RIGHT UPPER  ARM;  Surgeon: CAngelia Mould MD;  [place]  Service: Vascular;  Laterality: Right;  . AV FISTULA PLACEMENT Left 01/14/2016   Procedure: CREATION OF LEFT UPPER ARM ARTERIOVENOUS FISTULA;  Surgeon: CAngelia Mould MD;  [place]  Service: Vascular;  Laterality:  Left;  . BASCILIC VEIN TRANSPOSITION Right 08/22/2014   Procedure: RIGHT UPPER ARM BASCILIC VEIN TRANSPOSITION;  Surgeon: Angelia Mould, MD;  Location: [facility] [place];  Service: Vascular;  Laterality: Right;  . BELOW KNEE LEG AMPUTATION Right 04/26/2016  . CARDIAC CATHETERIZATION    . CARDIAC DEFIBRILLATOR PLACEMENT  06/27/2013   Sub Q       BY DR Caryl Comes  . COLONOSCOPY WITH PROPOFOL N/A 07/22/2015   Procedure: COLONOSCOPY WITH PROPOFOL;  Surgeon: Doran Stabler, MD;  [facility];  Service: Gastroenterology;  Laterality: N/A;  . FEMORAL-POPLITEAL BYPASS GRAFT Right 03/31/2016   Procedure: BYPASS GRAFT FEMORAL-POPLITEAL ARTERY USING RIGHT GREATER SAPHENOUS NONREVERSED VEIN;  Surgeon: Angelia Mould, MD;  Location: [facility];  Service: Vascular;  Laterality: Right;  . HERNIA REPAIR    . I&D EXTREMITY Right 03/31/2016   Procedure: IRRIGATION AND DEBRIDEMENT FOOT;  Surgeon: Angelia Mould, MD;  Location: [place];  Service: Vascular;  Laterality: Right;  . IMPLANTABLE  CARDIOVERTER DEFIBRILLATOR IMPLANT N/A 06/27/2013   Procedure: SUB Q ICD;  Surgeon: Deboraha Sprang, MD;  Location: [facility] CATH LAB;  Service: Cardiovascular;  Laterality: N/A;  . INTRAOPERATIVE ARTERIOGRAM Right 03/31/2016   Procedure: INTRA OPERATIVE ARTERIOGRAM;  Surgeon: Angelia Mould, MD;  Location: [place];  Service: Vascular;  Laterality: Right;  . IR GENERIC HISTORICAL Right 11/30/2015   IR THROMBECTOMY AV FISTULA W/THROMBOLYSIS/PTA INC/SHUNT/IMG RIGHT 11/30/2015 Aletta Edouard, MD [facility]  . IR GENERIC HISTORICAL  11/30/2015   IR US GUIDE VASC ACCESS RIGHT 11/30/2015 Aletta Edouard, MD [facility]  . IR GENERIC HISTORICAL Right 12/15/2015   IR THROMBECTOMY AV FISTULA W/THROMBOLYSIS/PTA/STENT INC/SHUNT/IMG RT 12/15/2015 Arne Cleveland, MD [facility]  . IR GENERIC HISTORICAL  12/15/2015   IR US GUIDE VASC ACCESS RIGHT 12/15/2015 Arne Cleveland, MD [facility]  . IR GENERIC HISTORICAL  12/28/2015   IR FLUORO GUIDE CV LINE RIGHT 12/28/2015 Marybelle Killings, MD [facility]  . IR GENERIC HISTORICAL  12/28/2015   IR US GUIDE VASC ACCESS RIGHT 12/28/2015 Marybelle Killings, MD [facility]  . LEFT A ND RIGHT HEART CATH  01/30/2013   DR Sung Amabile  . LEFT AND RIGHT HEART CATHETERIZATION WITH CORONARY ANGIOGRAM N/A 01/30/2013   Procedure: LEFT AND RIGHT HEART CATHETERIZATION WITH CORONARY ANGIOGRAM;  Surgeon: Jolaine Artist, MD;  Location: [facility] CATH LAB;  Service: Cardiovascular;  Laterality: N/A;  . PERIPHERAL VASCULAR CATHETERIZATION Right 01/26/2015   Procedure: A/V Fistulagram;  Surgeon: Angelia Mould, MD;  Location: [facility];  Service: Cardiovascular;  Laterality: Right;  . reapea urethral surgery for recurrent obstruction  2011  . TOTAL KNEE ARTHROPLASTY Right 2007  . VEIN HARVEST Right 03/31/2016   Procedure: RIGHT GREATER SAPHENOUS VEIN HARVEST;  Surgeon: Angelia Mould, MD;  Location: [place];  Service: Vascular;  Laterality: Right;   Social History    Occupational History  . Not on file  Tobacco Use  . Smoking status: Former Smoker    Packs/day: 2.00    Years: 32.00    Pack years: 64.00    Types: Cigarettes    Last attempt to quit: 05/11/2010    Years since quitting: 7.3  . Smokeless tobacco: Never Used  Substance and Sexual Activity  . Alcohol use: No  . Drug use: No  . Sexual activity: Yes

## 2017-09-05 DIAGNOSIS — N186 End stage renal disease: Secondary | ICD-10-CM | POA: Diagnosis not present

## 2017-09-05 DIAGNOSIS — Z992 Dependence on renal dialysis: Secondary | ICD-10-CM | POA: Diagnosis not present

## 2017-09-05 DIAGNOSIS — N2581 Secondary hyperparathyroidism of renal origin: Secondary | ICD-10-CM | POA: Diagnosis not present

## 2017-09-05 DIAGNOSIS — D509 Iron deficiency anemia, unspecified: Secondary | ICD-10-CM | POA: Diagnosis not present

## 2017-09-05 DIAGNOSIS — D631 Anemia in chronic kidney disease: Secondary | ICD-10-CM | POA: Diagnosis not present

## 2017-09-07 DIAGNOSIS — N186 End stage renal disease: Secondary | ICD-10-CM | POA: Diagnosis not present

## 2017-09-07 DIAGNOSIS — N2581 Secondary hyperparathyroidism of renal origin: Secondary | ICD-10-CM | POA: Diagnosis not present

## 2017-09-07 DIAGNOSIS — D631 Anemia in chronic kidney disease: Secondary | ICD-10-CM | POA: Diagnosis not present

## 2017-09-07 DIAGNOSIS — Z992 Dependence on renal dialysis: Secondary | ICD-10-CM | POA: Diagnosis not present

## 2017-09-07 DIAGNOSIS — D509 Iron deficiency anemia, unspecified: Secondary | ICD-10-CM | POA: Diagnosis not present

## 2017-09-08 DIAGNOSIS — Z992 Dependence on renal dialysis: Secondary | ICD-10-CM | POA: Diagnosis not present

## 2017-09-08 DIAGNOSIS — N186 End stage renal disease: Secondary | ICD-10-CM | POA: Diagnosis not present

## 2017-09-09 DIAGNOSIS — D509 Iron deficiency anemia, unspecified: Secondary | ICD-10-CM | POA: Diagnosis not present

## 2017-09-09 DIAGNOSIS — N2581 Secondary hyperparathyroidism of renal origin: Secondary | ICD-10-CM | POA: Diagnosis not present

## 2017-09-09 DIAGNOSIS — N186 End stage renal disease: Secondary | ICD-10-CM | POA: Diagnosis not present

## 2017-09-09 DIAGNOSIS — D631 Anemia in chronic kidney disease: Secondary | ICD-10-CM | POA: Diagnosis not present

## 2017-09-09 DIAGNOSIS — Z992 Dependence on renal dialysis: Secondary | ICD-10-CM | POA: Diagnosis not present

## 2017-09-12 DIAGNOSIS — E8779 Other fluid overload: Secondary | ICD-10-CM | POA: Diagnosis not present

## 2017-09-12 DIAGNOSIS — Z992 Dependence on renal dialysis: Secondary | ICD-10-CM | POA: Diagnosis not present

## 2017-09-12 DIAGNOSIS — D631 Anemia in chronic kidney disease: Secondary | ICD-10-CM | POA: Diagnosis not present

## 2017-09-12 DIAGNOSIS — N2581 Secondary hyperparathyroidism of renal origin: Secondary | ICD-10-CM | POA: Diagnosis not present

## 2017-09-12 DIAGNOSIS — N186 End stage renal disease: Secondary | ICD-10-CM | POA: Diagnosis not present

## 2017-09-12 DIAGNOSIS — D509 Iron deficiency anemia, unspecified: Secondary | ICD-10-CM | POA: Diagnosis not present

## 2017-09-14 DIAGNOSIS — N186 End stage renal disease: Secondary | ICD-10-CM | POA: Diagnosis not present

## 2017-09-14 DIAGNOSIS — N2581 Secondary hyperparathyroidism of renal origin: Secondary | ICD-10-CM | POA: Diagnosis not present

## 2017-09-14 DIAGNOSIS — E8779 Other fluid overload: Secondary | ICD-10-CM | POA: Diagnosis not present

## 2017-09-14 DIAGNOSIS — Z992 Dependence on renal dialysis: Secondary | ICD-10-CM | POA: Diagnosis not present

## 2017-09-14 DIAGNOSIS — D631 Anemia in chronic kidney disease: Secondary | ICD-10-CM | POA: Diagnosis not present

## 2017-09-14 DIAGNOSIS — D509 Iron deficiency anemia, unspecified: Secondary | ICD-10-CM | POA: Diagnosis not present

## 2017-09-16 DIAGNOSIS — N2581 Secondary hyperparathyroidism of renal origin: Secondary | ICD-10-CM | POA: Diagnosis not present

## 2017-09-16 DIAGNOSIS — D509 Iron deficiency anemia, unspecified: Secondary | ICD-10-CM | POA: Diagnosis not present

## 2017-09-16 DIAGNOSIS — Z992 Dependence on renal dialysis: Secondary | ICD-10-CM | POA: Diagnosis not present

## 2017-09-16 DIAGNOSIS — D631 Anemia in chronic kidney disease: Secondary | ICD-10-CM | POA: Diagnosis not present

## 2017-09-16 DIAGNOSIS — E8779 Other fluid overload: Secondary | ICD-10-CM | POA: Diagnosis not present

## 2017-09-16 DIAGNOSIS — N186 End stage renal disease: Secondary | ICD-10-CM | POA: Diagnosis not present

## 2017-09-18 ENCOUNTER — Ambulatory Visit (INDEPENDENT_AMBULATORY_CARE_PROVIDER_SITE_OTHER): Payer: Medicare Other | Admitting: *Deleted

## 2017-09-18 DIAGNOSIS — I5022 Chronic systolic (congestive) heart failure: Secondary | ICD-10-CM

## 2017-09-18 DIAGNOSIS — I428 Other cardiomyopathies: Secondary | ICD-10-CM

## 2017-09-18 LAB — CUP PACEART INCLINIC DEVICE CHECK
Date Time Interrogation Session: 20190909111605
Implantable Pulse Generator Implant Date: 20150618
Pulse Gen Model: 1010
Pulse Gen Serial Number: 111255

## 2017-09-18 NOTE — Progress Notes (Signed)
Subcutaneous ICD check in clinic. 0 untreated episodes; 0 treated episodes; 0 shocks delivered. Electrode impedance status okay. No programming changes. Remaining longevity to ERI 41%. ROV with [facility] 12/27/17.

## 2017-09-19 DIAGNOSIS — Z992 Dependence on renal dialysis: Secondary | ICD-10-CM | POA: Diagnosis not present

## 2017-09-19 DIAGNOSIS — N186 End stage renal disease: Secondary | ICD-10-CM | POA: Diagnosis not present

## 2017-09-19 DIAGNOSIS — N2581 Secondary hyperparathyroidism of renal origin: Secondary | ICD-10-CM | POA: Diagnosis not present

## 2017-09-19 DIAGNOSIS — E8779 Other fluid overload: Secondary | ICD-10-CM | POA: Diagnosis not present

## 2017-09-19 DIAGNOSIS — D631 Anemia in chronic kidney disease: Secondary | ICD-10-CM | POA: Diagnosis not present

## 2017-09-19 DIAGNOSIS — D509 Iron deficiency anemia, unspecified: Secondary | ICD-10-CM | POA: Diagnosis not present

## 2017-09-21 DIAGNOSIS — E8779 Other fluid overload: Secondary | ICD-10-CM | POA: Diagnosis not present

## 2017-09-21 DIAGNOSIS — N186 End stage renal disease: Secondary | ICD-10-CM | POA: Diagnosis not present

## 2017-09-21 DIAGNOSIS — N2581 Secondary hyperparathyroidism of renal origin: Secondary | ICD-10-CM | POA: Diagnosis not present

## 2017-09-21 DIAGNOSIS — Z992 Dependence on renal dialysis: Secondary | ICD-10-CM | POA: Diagnosis not present

## 2017-09-21 DIAGNOSIS — D631 Anemia in chronic kidney disease: Secondary | ICD-10-CM | POA: Diagnosis not present

## 2017-09-21 DIAGNOSIS — D509 Iron deficiency anemia, unspecified: Secondary | ICD-10-CM | POA: Diagnosis not present

## 2017-09-23 DIAGNOSIS — N2581 Secondary hyperparathyroidism of renal origin: Secondary | ICD-10-CM | POA: Diagnosis not present

## 2017-09-23 DIAGNOSIS — E8779 Other fluid overload: Secondary | ICD-10-CM | POA: Diagnosis not present

## 2017-09-23 DIAGNOSIS — N186 End stage renal disease: Secondary | ICD-10-CM | POA: Diagnosis not present

## 2017-09-23 DIAGNOSIS — D631 Anemia in chronic kidney disease: Secondary | ICD-10-CM | POA: Diagnosis not present

## 2017-09-23 DIAGNOSIS — D509 Iron deficiency anemia, unspecified: Secondary | ICD-10-CM | POA: Diagnosis not present

## 2017-09-23 DIAGNOSIS — Z992 Dependence on renal dialysis: Secondary | ICD-10-CM | POA: Diagnosis not present

## 2017-09-25 DIAGNOSIS — Z992 Dependence on renal dialysis: Secondary | ICD-10-CM | POA: Diagnosis not present

## 2017-09-25 DIAGNOSIS — N186 End stage renal disease: Secondary | ICD-10-CM | POA: Diagnosis not present

## 2017-09-25 DIAGNOSIS — D509 Iron deficiency anemia, unspecified: Secondary | ICD-10-CM | POA: Diagnosis not present

## 2017-09-25 DIAGNOSIS — D631 Anemia in chronic kidney disease: Secondary | ICD-10-CM | POA: Diagnosis not present

## 2017-09-25 DIAGNOSIS — N2581 Secondary hyperparathyroidism of renal origin: Secondary | ICD-10-CM | POA: Diagnosis not present

## 2017-09-25 DIAGNOSIS — E8779 Other fluid overload: Secondary | ICD-10-CM | POA: Diagnosis not present

## 2017-09-26 DIAGNOSIS — D509 Iron deficiency anemia, unspecified: Secondary | ICD-10-CM | POA: Diagnosis not present

## 2017-09-26 DIAGNOSIS — D631 Anemia in chronic kidney disease: Secondary | ICD-10-CM | POA: Diagnosis not present

## 2017-09-26 DIAGNOSIS — E8779 Other fluid overload: Secondary | ICD-10-CM | POA: Diagnosis not present

## 2017-09-26 DIAGNOSIS — N186 End stage renal disease: Secondary | ICD-10-CM | POA: Diagnosis not present

## 2017-09-26 DIAGNOSIS — N2581 Secondary hyperparathyroidism of renal origin: Secondary | ICD-10-CM | POA: Diagnosis not present

## 2017-09-26 DIAGNOSIS — Z992 Dependence on renal dialysis: Secondary | ICD-10-CM | POA: Diagnosis not present

## 2017-09-28 DIAGNOSIS — D509 Iron deficiency anemia, unspecified: Secondary | ICD-10-CM | POA: Diagnosis not present

## 2017-09-28 DIAGNOSIS — Z992 Dependence on renal dialysis: Secondary | ICD-10-CM | POA: Diagnosis not present

## 2017-09-28 DIAGNOSIS — E8779 Other fluid overload: Secondary | ICD-10-CM | POA: Diagnosis not present

## 2017-09-28 DIAGNOSIS — N186 End stage renal disease: Secondary | ICD-10-CM | POA: Diagnosis not present

## 2017-09-28 DIAGNOSIS — D631 Anemia in chronic kidney disease: Secondary | ICD-10-CM | POA: Diagnosis not present

## 2017-09-28 DIAGNOSIS — N2581 Secondary hyperparathyroidism of renal origin: Secondary | ICD-10-CM | POA: Diagnosis not present

## 2017-09-30 DIAGNOSIS — N186 End stage renal disease: Secondary | ICD-10-CM | POA: Diagnosis not present

## 2017-09-30 DIAGNOSIS — D631 Anemia in chronic kidney disease: Secondary | ICD-10-CM | POA: Diagnosis not present

## 2017-09-30 DIAGNOSIS — N2581 Secondary hyperparathyroidism of renal origin: Secondary | ICD-10-CM | POA: Diagnosis not present

## 2017-09-30 DIAGNOSIS — E8779 Other fluid overload: Secondary | ICD-10-CM | POA: Diagnosis not present

## 2017-09-30 DIAGNOSIS — D509 Iron deficiency anemia, unspecified: Secondary | ICD-10-CM | POA: Diagnosis not present

## 2017-09-30 DIAGNOSIS — Z992 Dependence on renal dialysis: Secondary | ICD-10-CM | POA: Diagnosis not present

## 2017-10-02 DIAGNOSIS — Z6829 Body mass index (BMI) 29.0-29.9, adult: Secondary | ICD-10-CM | POA: Diagnosis not present

## 2017-10-02 DIAGNOSIS — R509 Fever, unspecified: Secondary | ICD-10-CM | POA: Diagnosis not present

## 2017-10-02 DIAGNOSIS — H6591 Unspecified nonsuppurative otitis media, right ear: Secondary | ICD-10-CM | POA: Diagnosis not present

## 2017-10-02 DIAGNOSIS — J029 Acute pharyngitis, unspecified: Secondary | ICD-10-CM | POA: Diagnosis not present

## 2017-10-03 DIAGNOSIS — N2581 Secondary hyperparathyroidism of renal origin: Secondary | ICD-10-CM | POA: Diagnosis not present

## 2017-10-03 DIAGNOSIS — E119 Type 2 diabetes mellitus without complications: Secondary | ICD-10-CM | POA: Diagnosis not present

## 2017-10-03 DIAGNOSIS — N186 End stage renal disease: Secondary | ICD-10-CM | POA: Diagnosis not present

## 2017-10-03 DIAGNOSIS — E8779 Other fluid overload: Secondary | ICD-10-CM | POA: Diagnosis not present

## 2017-10-03 DIAGNOSIS — D631 Anemia in chronic kidney disease: Secondary | ICD-10-CM | POA: Diagnosis not present

## 2017-10-03 DIAGNOSIS — D509 Iron deficiency anemia, unspecified: Secondary | ICD-10-CM | POA: Diagnosis not present

## 2017-10-03 DIAGNOSIS — Z992 Dependence on renal dialysis: Secondary | ICD-10-CM | POA: Diagnosis not present

## 2017-10-03 DIAGNOSIS — Z794 Long term (current) use of insulin: Secondary | ICD-10-CM | POA: Diagnosis not present

## 2017-10-05 DIAGNOSIS — D509 Iron deficiency anemia, unspecified: Secondary | ICD-10-CM | POA: Diagnosis not present

## 2017-10-05 DIAGNOSIS — D631 Anemia in chronic kidney disease: Secondary | ICD-10-CM | POA: Diagnosis not present

## 2017-10-05 DIAGNOSIS — N2581 Secondary hyperparathyroidism of renal origin: Secondary | ICD-10-CM | POA: Diagnosis not present

## 2017-10-05 DIAGNOSIS — N186 End stage renal disease: Secondary | ICD-10-CM | POA: Diagnosis not present

## 2017-10-05 DIAGNOSIS — Z992 Dependence on renal dialysis: Secondary | ICD-10-CM | POA: Diagnosis not present

## 2017-10-05 DIAGNOSIS — E8779 Other fluid overload: Secondary | ICD-10-CM | POA: Diagnosis not present

## 2017-10-07 DIAGNOSIS — D509 Iron deficiency anemia, unspecified: Secondary | ICD-10-CM | POA: Diagnosis not present

## 2017-10-07 DIAGNOSIS — E8779 Other fluid overload: Secondary | ICD-10-CM | POA: Diagnosis not present

## 2017-10-07 DIAGNOSIS — N186 End stage renal disease: Secondary | ICD-10-CM | POA: Diagnosis not present

## 2017-10-07 DIAGNOSIS — Z992 Dependence on renal dialysis: Secondary | ICD-10-CM | POA: Diagnosis not present

## 2017-10-07 DIAGNOSIS — D631 Anemia in chronic kidney disease: Secondary | ICD-10-CM | POA: Diagnosis not present

## 2017-10-07 DIAGNOSIS — N2581 Secondary hyperparathyroidism of renal origin: Secondary | ICD-10-CM | POA: Diagnosis not present

## 2017-10-09 DIAGNOSIS — Z992 Dependence on renal dialysis: Secondary | ICD-10-CM | POA: Diagnosis not present

## 2017-10-09 DIAGNOSIS — N186 End stage renal disease: Secondary | ICD-10-CM | POA: Diagnosis not present

## 2017-10-10 DIAGNOSIS — D631 Anemia in chronic kidney disease: Secondary | ICD-10-CM | POA: Diagnosis not present

## 2017-10-10 DIAGNOSIS — N2581 Secondary hyperparathyroidism of renal origin: Secondary | ICD-10-CM | POA: Diagnosis not present

## 2017-10-10 DIAGNOSIS — D509 Iron deficiency anemia, unspecified: Secondary | ICD-10-CM | POA: Diagnosis not present

## 2017-10-10 DIAGNOSIS — Z992 Dependence on renal dialysis: Secondary | ICD-10-CM | POA: Diagnosis not present

## 2017-10-10 DIAGNOSIS — Z23 Encounter for immunization: Secondary | ICD-10-CM | POA: Diagnosis not present

## 2017-10-10 DIAGNOSIS — N186 End stage renal disease: Secondary | ICD-10-CM | POA: Diagnosis not present

## 2017-10-12 DIAGNOSIS — Z992 Dependence on renal dialysis: Secondary | ICD-10-CM | POA: Diagnosis not present

## 2017-10-12 DIAGNOSIS — D631 Anemia in chronic kidney disease: Secondary | ICD-10-CM | POA: Diagnosis not present

## 2017-10-12 DIAGNOSIS — Z23 Encounter for immunization: Secondary | ICD-10-CM | POA: Diagnosis not present

## 2017-10-12 DIAGNOSIS — N186 End stage renal disease: Secondary | ICD-10-CM | POA: Diagnosis not present

## 2017-10-12 DIAGNOSIS — N2581 Secondary hyperparathyroidism of renal origin: Secondary | ICD-10-CM | POA: Diagnosis not present

## 2017-10-12 DIAGNOSIS — D509 Iron deficiency anemia, unspecified: Secondary | ICD-10-CM | POA: Diagnosis not present

## 2017-10-14 DIAGNOSIS — N2581 Secondary hyperparathyroidism of renal origin: Secondary | ICD-10-CM | POA: Diagnosis not present

## 2017-10-14 DIAGNOSIS — N186 End stage renal disease: Secondary | ICD-10-CM | POA: Diagnosis not present

## 2017-10-14 DIAGNOSIS — Z23 Encounter for immunization: Secondary | ICD-10-CM | POA: Diagnosis not present

## 2017-10-14 DIAGNOSIS — Z992 Dependence on renal dialysis: Secondary | ICD-10-CM | POA: Diagnosis not present

## 2017-10-14 DIAGNOSIS — D509 Iron deficiency anemia, unspecified: Secondary | ICD-10-CM | POA: Diagnosis not present

## 2017-10-14 DIAGNOSIS — D631 Anemia in chronic kidney disease: Secondary | ICD-10-CM | POA: Diagnosis not present

## 2017-10-17 DIAGNOSIS — Z23 Encounter for immunization: Secondary | ICD-10-CM | POA: Diagnosis not present

## 2017-10-17 DIAGNOSIS — N186 End stage renal disease: Secondary | ICD-10-CM | POA: Diagnosis not present

## 2017-10-17 DIAGNOSIS — D509 Iron deficiency anemia, unspecified: Secondary | ICD-10-CM | POA: Diagnosis not present

## 2017-10-17 DIAGNOSIS — D631 Anemia in chronic kidney disease: Secondary | ICD-10-CM | POA: Diagnosis not present

## 2017-10-17 DIAGNOSIS — Z992 Dependence on renal dialysis: Secondary | ICD-10-CM | POA: Diagnosis not present

## 2017-10-17 DIAGNOSIS — N2581 Secondary hyperparathyroidism of renal origin: Secondary | ICD-10-CM | POA: Diagnosis not present

## 2017-10-19 DIAGNOSIS — Z23 Encounter for immunization: Secondary | ICD-10-CM | POA: Diagnosis not present

## 2017-10-19 DIAGNOSIS — D631 Anemia in chronic kidney disease: Secondary | ICD-10-CM | POA: Diagnosis not present

## 2017-10-19 DIAGNOSIS — D509 Iron deficiency anemia, unspecified: Secondary | ICD-10-CM | POA: Diagnosis not present

## 2017-10-19 DIAGNOSIS — Z992 Dependence on renal dialysis: Secondary | ICD-10-CM | POA: Diagnosis not present

## 2017-10-19 DIAGNOSIS — N2581 Secondary hyperparathyroidism of renal origin: Secondary | ICD-10-CM | POA: Diagnosis not present

## 2017-10-19 DIAGNOSIS — N186 End stage renal disease: Secondary | ICD-10-CM | POA: Diagnosis not present

## 2017-10-21 DIAGNOSIS — N186 End stage renal disease: Secondary | ICD-10-CM | POA: Diagnosis not present

## 2017-10-21 DIAGNOSIS — Z992 Dependence on renal dialysis: Secondary | ICD-10-CM | POA: Diagnosis not present

## 2017-10-21 DIAGNOSIS — D509 Iron deficiency anemia, unspecified: Secondary | ICD-10-CM | POA: Diagnosis not present

## 2017-10-21 DIAGNOSIS — Z23 Encounter for immunization: Secondary | ICD-10-CM | POA: Diagnosis not present

## 2017-10-21 DIAGNOSIS — N2581 Secondary hyperparathyroidism of renal origin: Secondary | ICD-10-CM | POA: Diagnosis not present

## 2017-10-21 DIAGNOSIS — D631 Anemia in chronic kidney disease: Secondary | ICD-10-CM | POA: Diagnosis not present

## 2017-10-24 DIAGNOSIS — N2581 Secondary hyperparathyroidism of renal origin: Secondary | ICD-10-CM | POA: Diagnosis not present

## 2017-10-24 DIAGNOSIS — Z992 Dependence on renal dialysis: Secondary | ICD-10-CM | POA: Diagnosis not present

## 2017-10-24 DIAGNOSIS — Z23 Encounter for immunization: Secondary | ICD-10-CM | POA: Diagnosis not present

## 2017-10-24 DIAGNOSIS — D631 Anemia in chronic kidney disease: Secondary | ICD-10-CM | POA: Diagnosis not present

## 2017-10-24 DIAGNOSIS — N186 End stage renal disease: Secondary | ICD-10-CM | POA: Diagnosis not present

## 2017-10-24 DIAGNOSIS — D509 Iron deficiency anemia, unspecified: Secondary | ICD-10-CM | POA: Diagnosis not present

## 2017-10-26 DIAGNOSIS — D631 Anemia in chronic kidney disease: Secondary | ICD-10-CM | POA: Diagnosis not present

## 2017-10-26 DIAGNOSIS — N2581 Secondary hyperparathyroidism of renal origin: Secondary | ICD-10-CM | POA: Diagnosis not present

## 2017-10-26 DIAGNOSIS — Z23 Encounter for immunization: Secondary | ICD-10-CM | POA: Diagnosis not present

## 2017-10-26 DIAGNOSIS — D509 Iron deficiency anemia, unspecified: Secondary | ICD-10-CM | POA: Diagnosis not present

## 2017-10-26 DIAGNOSIS — N186 End stage renal disease: Secondary | ICD-10-CM | POA: Diagnosis not present

## 2017-10-26 DIAGNOSIS — Z992 Dependence on renal dialysis: Secondary | ICD-10-CM | POA: Diagnosis not present

## 2017-10-27 DIAGNOSIS — N186 End stage renal disease: Secondary | ICD-10-CM | POA: Diagnosis not present

## 2017-10-27 DIAGNOSIS — Z992 Dependence on renal dialysis: Secondary | ICD-10-CM | POA: Diagnosis not present

## 2017-10-27 DIAGNOSIS — E8770 Fluid overload, unspecified: Secondary | ICD-10-CM | POA: Diagnosis not present

## 2017-10-28 DIAGNOSIS — N2581 Secondary hyperparathyroidism of renal origin: Secondary | ICD-10-CM | POA: Diagnosis not present

## 2017-10-28 DIAGNOSIS — D509 Iron deficiency anemia, unspecified: Secondary | ICD-10-CM | POA: Diagnosis not present

## 2017-10-28 DIAGNOSIS — Z992 Dependence on renal dialysis: Secondary | ICD-10-CM | POA: Diagnosis not present

## 2017-10-28 DIAGNOSIS — N186 End stage renal disease: Secondary | ICD-10-CM | POA: Diagnosis not present

## 2017-10-28 DIAGNOSIS — D631 Anemia in chronic kidney disease: Secondary | ICD-10-CM | POA: Diagnosis not present

## 2017-10-28 DIAGNOSIS — Z23 Encounter for immunization: Secondary | ICD-10-CM | POA: Diagnosis not present

## 2017-10-31 DIAGNOSIS — N186 End stage renal disease: Secondary | ICD-10-CM | POA: Diagnosis not present

## 2017-10-31 DIAGNOSIS — D509 Iron deficiency anemia, unspecified: Secondary | ICD-10-CM | POA: Diagnosis not present

## 2017-10-31 DIAGNOSIS — Z23 Encounter for immunization: Secondary | ICD-10-CM | POA: Diagnosis not present

## 2017-10-31 DIAGNOSIS — Z992 Dependence on renal dialysis: Secondary | ICD-10-CM | POA: Diagnosis not present

## 2017-10-31 DIAGNOSIS — D631 Anemia in chronic kidney disease: Secondary | ICD-10-CM | POA: Diagnosis not present

## 2017-10-31 DIAGNOSIS — N2581 Secondary hyperparathyroidism of renal origin: Secondary | ICD-10-CM | POA: Diagnosis not present

## 2017-11-02 DIAGNOSIS — D631 Anemia in chronic kidney disease: Secondary | ICD-10-CM | POA: Diagnosis not present

## 2017-11-02 DIAGNOSIS — N2581 Secondary hyperparathyroidism of renal origin: Secondary | ICD-10-CM | POA: Diagnosis not present

## 2017-11-02 DIAGNOSIS — D509 Iron deficiency anemia, unspecified: Secondary | ICD-10-CM | POA: Diagnosis not present

## 2017-11-02 DIAGNOSIS — Z23 Encounter for immunization: Secondary | ICD-10-CM | POA: Diagnosis not present

## 2017-11-02 DIAGNOSIS — Z992 Dependence on renal dialysis: Secondary | ICD-10-CM | POA: Diagnosis not present

## 2017-11-02 DIAGNOSIS — N186 End stage renal disease: Secondary | ICD-10-CM | POA: Diagnosis not present

## 2017-11-04 DIAGNOSIS — Z992 Dependence on renal dialysis: Secondary | ICD-10-CM | POA: Diagnosis not present

## 2017-11-04 DIAGNOSIS — N186 End stage renal disease: Secondary | ICD-10-CM | POA: Diagnosis not present

## 2017-11-04 DIAGNOSIS — D509 Iron deficiency anemia, unspecified: Secondary | ICD-10-CM | POA: Diagnosis not present

## 2017-11-04 DIAGNOSIS — D631 Anemia in chronic kidney disease: Secondary | ICD-10-CM | POA: Diagnosis not present

## 2017-11-04 DIAGNOSIS — N2581 Secondary hyperparathyroidism of renal origin: Secondary | ICD-10-CM | POA: Diagnosis not present

## 2017-11-04 DIAGNOSIS — Z23 Encounter for immunization: Secondary | ICD-10-CM | POA: Diagnosis not present

## 2017-11-06 DIAGNOSIS — E871 Hypo-osmolality and hyponatremia: Secondary | ICD-10-CM | POA: Diagnosis present

## 2017-11-06 DIAGNOSIS — I428 Other cardiomyopathies: Secondary | ICD-10-CM | POA: Diagnosis present

## 2017-11-06 DIAGNOSIS — Z79899 Other long term (current) drug therapy: Secondary | ICD-10-CM | POA: Diagnosis not present

## 2017-11-06 DIAGNOSIS — I132 Hypertensive heart and chronic kidney disease with heart failure and with stage 5 chronic kidney disease, or end stage renal disease: Secondary | ICD-10-CM | POA: Diagnosis present

## 2017-11-06 DIAGNOSIS — Z6830 Body mass index (BMI) 30.0-30.9, adult: Secondary | ICD-10-CM | POA: Diagnosis not present

## 2017-11-06 DIAGNOSIS — R069 Unspecified abnormalities of breathing: Secondary | ICD-10-CM | POA: Diagnosis not present

## 2017-11-06 DIAGNOSIS — Z95810 Presence of automatic (implantable) cardiac defibrillator: Secondary | ICD-10-CM | POA: Diagnosis not present

## 2017-11-06 DIAGNOSIS — Z833 Family history of diabetes mellitus: Secondary | ICD-10-CM | POA: Diagnosis not present

## 2017-11-06 DIAGNOSIS — Z8249 Family history of ischemic heart disease and other diseases of the circulatory system: Secondary | ICD-10-CM | POA: Diagnosis not present

## 2017-11-06 DIAGNOSIS — Z7982 Long term (current) use of aspirin: Secondary | ICD-10-CM | POA: Diagnosis not present

## 2017-11-06 DIAGNOSIS — R7989 Other specified abnormal findings of blood chemistry: Secondary | ICD-10-CM | POA: Diagnosis not present

## 2017-11-06 DIAGNOSIS — I10 Essential (primary) hypertension: Secondary | ICD-10-CM | POA: Diagnosis not present

## 2017-11-06 DIAGNOSIS — E875 Hyperkalemia: Secondary | ICD-10-CM | POA: Diagnosis present

## 2017-11-06 DIAGNOSIS — Z87891 Personal history of nicotine dependence: Secondary | ICD-10-CM | POA: Diagnosis not present

## 2017-11-06 DIAGNOSIS — E119 Type 2 diabetes mellitus without complications: Secondary | ICD-10-CM | POA: Diagnosis not present

## 2017-11-06 DIAGNOSIS — E1165 Type 2 diabetes mellitus with hyperglycemia: Secondary | ICD-10-CM | POA: Diagnosis not present

## 2017-11-06 DIAGNOSIS — R079 Chest pain, unspecified: Secondary | ICD-10-CM | POA: Diagnosis not present

## 2017-11-06 DIAGNOSIS — N186 End stage renal disease: Secondary | ICD-10-CM | POA: Diagnosis present

## 2017-11-06 DIAGNOSIS — E1129 Type 2 diabetes mellitus with other diabetic kidney complication: Secondary | ICD-10-CM | POA: Diagnosis not present

## 2017-11-06 DIAGNOSIS — E785 Hyperlipidemia, unspecified: Secondary | ICD-10-CM | POA: Diagnosis present

## 2017-11-06 DIAGNOSIS — R0902 Hypoxemia: Secondary | ICD-10-CM | POA: Diagnosis not present

## 2017-11-06 DIAGNOSIS — E1142 Type 2 diabetes mellitus with diabetic polyneuropathy: Secondary | ICD-10-CM | POA: Diagnosis present

## 2017-11-06 DIAGNOSIS — R0789 Other chest pain: Secondary | ICD-10-CM | POA: Diagnosis not present

## 2017-11-06 DIAGNOSIS — I5023 Acute on chronic systolic (congestive) heart failure: Secondary | ICD-10-CM | POA: Diagnosis present

## 2017-11-06 DIAGNOSIS — Z794 Long term (current) use of insulin: Secondary | ICD-10-CM | POA: Diagnosis not present

## 2017-11-06 DIAGNOSIS — E669 Obesity, unspecified: Secondary | ICD-10-CM | POA: Diagnosis present

## 2017-11-06 DIAGNOSIS — Z9111 Patient's noncompliance with dietary regimen: Secondary | ICD-10-CM | POA: Diagnosis not present

## 2017-11-06 DIAGNOSIS — J449 Chronic obstructive pulmonary disease, unspecified: Secondary | ICD-10-CM | POA: Diagnosis present

## 2017-11-06 DIAGNOSIS — Z89511 Acquired absence of right leg below knee: Secondary | ICD-10-CM | POA: Diagnosis not present

## 2017-11-06 DIAGNOSIS — E1122 Type 2 diabetes mellitus with diabetic chronic kidney disease: Secondary | ICD-10-CM | POA: Diagnosis present

## 2017-11-06 DIAGNOSIS — I509 Heart failure, unspecified: Secondary | ICD-10-CM | POA: Diagnosis not present

## 2017-11-06 DIAGNOSIS — Z992 Dependence on renal dialysis: Secondary | ICD-10-CM | POA: Diagnosis not present

## 2017-11-06 LAB — CBC AND DIFFERENTIAL
HCT: 41 (ref 41–53)
Hemoglobin: 13.8 (ref 13.5–17.5)
Neutrophils Absolute: 7
Platelets: 131 — AB (ref 150–399)
WBC: 9

## 2017-11-06 LAB — HEPATIC FUNCTION PANEL
ALT: 25 (ref 10–40)
AST: 25 (ref 14–40)
Alkaline Phosphatase: 181 — AB (ref 25–125)
Bilirubin, Total: 0.3

## 2017-11-06 LAB — POCT INR: INR: 1 (ref <=1.1)

## 2017-11-07 LAB — BASIC METABOLIC PANEL
BUN: 75 — AB (ref 4–21)
Creatinine: 10.3 — AB (ref <=1.3)
Glucose: 351

## 2017-11-07 LAB — HEMOGLOBIN A1C: Hgb A1c MFr Bld: 10.7 — AB (ref 4.0–6.0)

## 2017-11-09 DIAGNOSIS — Z992 Dependence on renal dialysis: Secondary | ICD-10-CM | POA: Diagnosis not present

## 2017-11-09 DIAGNOSIS — N2581 Secondary hyperparathyroidism of renal origin: Secondary | ICD-10-CM | POA: Diagnosis not present

## 2017-11-09 DIAGNOSIS — Z23 Encounter for immunization: Secondary | ICD-10-CM | POA: Diagnosis not present

## 2017-11-09 DIAGNOSIS — N186 End stage renal disease: Secondary | ICD-10-CM | POA: Diagnosis not present

## 2017-11-09 DIAGNOSIS — D631 Anemia in chronic kidney disease: Secondary | ICD-10-CM | POA: Diagnosis not present

## 2017-11-09 DIAGNOSIS — D509 Iron deficiency anemia, unspecified: Secondary | ICD-10-CM | POA: Diagnosis not present

## 2017-11-11 DIAGNOSIS — D509 Iron deficiency anemia, unspecified: Secondary | ICD-10-CM | POA: Diagnosis not present

## 2017-11-11 DIAGNOSIS — N186 End stage renal disease: Secondary | ICD-10-CM | POA: Diagnosis not present

## 2017-11-11 DIAGNOSIS — N2581 Secondary hyperparathyroidism of renal origin: Secondary | ICD-10-CM | POA: Diagnosis not present

## 2017-11-11 DIAGNOSIS — Z992 Dependence on renal dialysis: Secondary | ICD-10-CM | POA: Diagnosis not present

## 2017-11-11 DIAGNOSIS — D631 Anemia in chronic kidney disease: Secondary | ICD-10-CM | POA: Diagnosis not present

## 2017-11-13 ENCOUNTER — Inpatient Hospital Stay: Payer: Medicare Other | Admitting: Family Medicine

## 2017-11-14 DIAGNOSIS — D509 Iron deficiency anemia, unspecified: Secondary | ICD-10-CM | POA: Diagnosis not present

## 2017-11-14 DIAGNOSIS — N2581 Secondary hyperparathyroidism of renal origin: Secondary | ICD-10-CM | POA: Diagnosis not present

## 2017-11-14 DIAGNOSIS — Z992 Dependence on renal dialysis: Secondary | ICD-10-CM | POA: Diagnosis not present

## 2017-11-14 DIAGNOSIS — N186 End stage renal disease: Secondary | ICD-10-CM | POA: Diagnosis not present

## 2017-11-14 DIAGNOSIS — D631 Anemia in chronic kidney disease: Secondary | ICD-10-CM | POA: Diagnosis not present

## 2017-11-15 ENCOUNTER — Encounter: Payer: Self-pay | Admitting: Family Medicine

## 2017-11-15 ENCOUNTER — Other Ambulatory Visit: Payer: Self-pay

## 2017-11-15 ENCOUNTER — Ambulatory Visit (INDEPENDENT_AMBULATORY_CARE_PROVIDER_SITE_OTHER): Payer: Medicare Other | Admitting: Physician Assistant

## 2017-11-15 ENCOUNTER — Ambulatory Visit (INDEPENDENT_AMBULATORY_CARE_PROVIDER_SITE_OTHER): Payer: Medicare Other | Admitting: Family Medicine

## 2017-11-15 VITALS — BP 162/70 | HR 77 | Temp 98.7°F | Resp 16 | Ht 70.0 in | Wt 211.0 lb

## 2017-11-15 VITALS — BP 148/82 | HR 79 | Temp 98.2°F | Wt 212.6 lb

## 2017-11-15 DIAGNOSIS — E1165 Type 2 diabetes mellitus with hyperglycemia: Secondary | ICD-10-CM

## 2017-11-15 DIAGNOSIS — Z89511 Acquired absence of right leg below knee: Secondary | ICD-10-CM

## 2017-11-15 DIAGNOSIS — N186 End stage renal disease: Secondary | ICD-10-CM

## 2017-11-15 DIAGNOSIS — K219 Gastro-esophageal reflux disease without esophagitis: Secondary | ICD-10-CM

## 2017-11-15 DIAGNOSIS — Z992 Dependence on renal dialysis: Secondary | ICD-10-CM

## 2017-11-15 DIAGNOSIS — I6521 Occlusion and stenosis of right carotid artery: Secondary | ICD-10-CM

## 2017-11-15 DIAGNOSIS — E1129 Type 2 diabetes mellitus with other diabetic kidney complication: Secondary | ICD-10-CM

## 2017-11-15 NOTE — Progress Notes (Signed)
Subjective:     Patient ID: Anthony Bullock, male   DOB: [birth year], 57 y.o.   MRN: [id number]  HPI Patient has multiple chronic problems including history of CAD, nonischemic cardiomyopathy, systolic heart failure, hypertension, poorly controlled type 2 diabetes, end-stage renal disease on hemodialysis, osteoarthritis  Reported he was admitted October 28 after experiencing some left-sided chest pain along with dyspnea.  He states he was diagnosed with heart failure exacerbation.  He feels back to baseline at this time.  He had some recent poor compliance with sodium intake.  We do not have hospital discharge for review at this time.  Major complaint today is increased reflux symptoms.  Frequently wakes up at night with sour taste in his posterior pharynx area.  He frequently eats right before going to bed.  Drinks about 4 caffeinated Mountain Dew's per day.  No dysphagia.  Type 2 diabetes poorly controlled.  On Basaglar 60 units once daily.  He is supposed to be on Humalog but not taking that.  Not monitoring blood sugars.  He states he had A1c with recent hospitalization which was very high.  He does not recall the exact number.  Long hx of poor compliance with diet and medications.  Past Medical History:  Diagnosis Date  . 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 Allergic rhinitis   . Anemia   . Arthritis   . Chronic systolic heart failure ([place])    a. ECHO (12/2012) EF 25-30%, HK entireanteroseptal myocardium //  b.  EF 25%, diffuse HK, grade 1 diastolic dysfunction, MAC, mild LAE, normal RVSF, trivial pericardial effusion  . COPD (chronic obstructive pulmonary disease) ([place])   . Diabetes mellitus type II   . Diabetic nephropathy ([place])   . Diabetic neuropathy ([place])   . ESRD on hemodialysis [facility])    started HD June 2017, goes to [facility] HD unit, Dr Hinda Lenis  . History of cardiac catheterization    a.Myoview 1/15:  There is significant left  ventricular dysfunction. There may be slight scar at the apex. There is no significant ischemia. LV Ejection Fraction: 27%  //  b. RHC/LHC (1/15) with mean RA 6, PA 47/22 mean 33, mean PCWP 20, PVR 2.5 WU, CI 2.5; 80% dLAD stenosis, 70% diffuse large D.   . History of kidney stones   . Hyperlipidemia   . Hypertension   . Kidney stones   . NICM (nonischemic cardiomyopathy) ([place])    Primarily nonischemic. Echo (12/14) with EF 25-30%. Echo (3/15) with EF 25%, mild to moderately dilated LV, normal RV size and systolic function.   . Pneumonia   . Urethral stricture    Past Surgical History:  Procedure Laterality Date  . ABDOMINAL AORTOGRAM W/LOWER EXTREMITY N/A 03/30/2016   Procedure: Abdominal Aortogram w/Lower Extremity;  Surgeon: Angelia Mould, MD;  Location: [place] [facility];  Service: Cardiovascular;  Laterality: N/A;  . AMPUTATION Right 04/26/2016   Procedure: Right Below Knee Amputation;  Surgeon: Newt Minion, MD;  Location: [place];  Service: Orthopedics;  Laterality: Right;  . AV FISTULA PLACEMENT Right 09/08/2015   Procedure: INSERTION OF 4-39m x 45cm  ARTERIOVENOUS (AV) GORE-TEX GRAFT RIGHT UPPER  ARM;  Surgeon: CAngelia Mould MD;  [place]  Service: Vascular;  Laterality: Right;  . AV FISTULA PLACEMENT Left 01/14/2016   Procedure: CREATION OF LEFT UPPER ARM ARTERIOVENOUS FISTULA;  Surgeon: CAngelia Mould MD;  Location[place]  Service: Vascular;  Laterality: Left;  .  BASCILIC VEIN TRANSPOSITION Right 08/22/2014   Procedure: RIGHT UPPER ARM BASCILIC VEIN TRANSPOSITION;  Surgeon: Angelia Mould, MD;  Location: [place];  Service: Vascular;  Laterality: Right;  . BELOW KNEE LEG AMPUTATION Right 04/26/2016  . CARDIAC CATHETERIZATION    . CARDIAC DEFIBRILLATOR PLACEMENT  06/27/2013   Sub Q       BY DR Caryl Comes  . COLONOSCOPY WITH PROPOFOL N/A 07/22/2015   Procedure: COLONOSCOPY WITH PROPOFOL;  Surgeon: Doran Stabler, MD;  [facility];   Service: Gastroenterology;  Laterality: N/A;  . FEMORAL-POPLITEAL BYPASS GRAFT Right 03/31/2016   Procedure: BYPASS GRAFT FEMORAL-POPLITEAL ARTERY USING RIGHT GREATER SAPHENOUS NONREVERSED VEIN;  Surgeon: Angelia Mould, MD;  Location: [place];  Service: Vascular;  Laterality: Right;  . HERNIA REPAIR    . I&D EXTREMITY Right 03/31/2016   Procedure: IRRIGATION AND DEBRIDEMENT FOOT;  Surgeon: Angelia Mould, MD;  Location: [place];  Service: Vascular;  Laterality: Right;  . IMPLANTABLE CARDIOVERTER DEFIBRILLATOR IMPLANT N/A 06/27/2013   Procedure: SUB Q ICD;  Surgeon: Deboraha Sprang, MD;  Location: [facility] CATH LAB;  Service: Cardiovascular;  Laterality: N/A;  . INTRAOPERATIVE ARTERIOGRAM Right 03/31/2016   Procedure: INTRA OPERATIVE ARTERIOGRAM;  Surgeon: Angelia Mould, MD;  Location: [place];  Service: Vascular;  Laterality: Right;  . IR GENERIC HISTORICAL Right 11/30/2015   IR THROMBECTOMY AV FISTULA W/THROMBOLYSIS/PTA INC/SHUNT/IMG RIGHT 11/30/2015 Aletta Edouard, MD [facility]  . IR GENERIC HISTORICAL  11/30/2015   IR US GUIDE VASC ACCESS RIGHT 11/30/2015 Aletta Edouard, MD [facility]  . IR GENERIC HISTORICAL Right 12/15/2015   IR THROMBECTOMY AV FISTULA W/THROMBOLYSIS/PTA/STENT INC/SHUNT/IMG RT 12/15/2015 Arne Cleveland, MD [facility]  . IR GENERIC HISTORICAL  12/15/2015   IR US GUIDE VASC ACCESS RIGHT 12/15/2015 Arne Cleveland, MD [facility]  . IR GENERIC HISTORICAL  12/28/2015   IR FLUORO GUIDE CV LINE RIGHT 12/28/2015 Marybelle Killings, MD [facility]  . IR GENERIC HISTORICAL  12/28/2015   IR US GUIDE VASC ACCESS RIGHT 12/28/2015 Marybelle Killings, MD [facility]  . LEFT A ND RIGHT HEART CATH  01/30/2013   DR Sung Amabile  . LEFT AND RIGHT HEART CATHETERIZATION WITH CORONARY ANGIOGRAM N/A 01/30/2013   Procedure: LEFT AND RIGHT HEART CATHETERIZATION WITH CORONARY ANGIOGRAM;  Surgeon: Jolaine Artist, MD;  Location: [facility] CATH LAB;  Service: Cardiovascular;  Laterality: N/A;  .  PERIPHERAL VASCULAR CATHETERIZATION Right 01/26/2015   Procedure: A/V Fistulagram;  Surgeon: Angelia Mould, MD;  Location: [place] [facility];  Service: Cardiovascular;  Laterality: Right;  . reapea urethral surgery for recurrent obstruction  2011  . TOTAL KNEE ARTHROPLASTY Right 2007  . VEIN HARVEST Right 03/31/2016   Procedure: RIGHT GREATER SAPHENOUS VEIN HARVEST;  Surgeon: Angelia Mould, MD;  Location: [place];  Service: Vascular;  Laterality: Right;    reports that he quit smoking about 7 years ago. His smoking use included cigarettes. He has a 64.00 pack-year smoking history. He has never used smokeless tobacco. He reports that he does not drink alcohol or use drugs. family history includes Alcohol abuse in his father; Bladder Cancer in his mother; Diabetes in his maternal grandmother; Heart Problems in his maternal grandmother; Heart disease in his maternal grandfather; Melanoma in his father; Prostate cancer in his maternal grandfather; Stroke in his maternal grandmother. Allergies  Allergen Reactions  . Morphine Sulfate Rash and Other (See Comments)    Itches all over, red spots     Review of Systems  Constitutional: Negative for chills, fatigue, fever and unexpected weight change.  Eyes: Negative for visual disturbance.  Respiratory: Negative for cough, chest tightness and shortness of breath.   Cardiovascular: Negative for chest pain, palpitations and leg swelling.  Gastrointestinal: Negative for abdominal pain.  Endocrine: Negative for polydipsia.  Neurological: Negative for dizziness, syncope, weakness, light-headedness and headaches.       Objective:   Physical Exam  Constitutional: He appears well-developed and well-nourished.  Cardiovascular: Normal rate and regular rhythm.  Pulmonary/Chest: Effort normal and breath sounds normal. He has no wheezes. He has no rales.  Abdominal: Soft. There is no tenderness.  Neurological: He is alert.       Assessment:      #1 GERD symptoms poorly controlled.  History of poor dietary compliance  #2 type 2 diabetes poorly controlled with history of poor compliance  #3 end-stage renal disease on hemodialysis    Plan:     -Start Pepcid 10 mg once daily-(adjusted for his chronic renal failure) -Avoid eating within 2 to 3 hours of bedtime -elevated head of bed 4 to 6 inches. -Reduce caffeine intake gradually -Avoid eating high fatty foods at night.  We also discussed other potential dietary triggers with handout given -Start back Humalog sliding scale 3 times daily with meals.  We elected not to check A1c today since he has not been on his insulin recently consistently and we know his A1c will be poorly controlled.  We recommend a 59-monthfollow-up and recheck A1c then  BEulas PostMD [facility]

## 2017-11-15 NOTE — Progress Notes (Signed)
[facility]  HISTORY AND PHYSICAL   History of Present Illness:  Anthony Bullock is a 57 y.o. male who I saw on 08/19/2015. He had a right basilic vein transposition done on 08/22/2014. On 01/26/2015 he had venoplasty of his fistula. His fistula subsequently occluded. We had discussed placing a fistula in the left arm although he favored placement of an AV graft as he had been frustrated with his fistulas. Therefore an AV graft was placed in the right arm on 09/08/2015. This was a 4-7 mm right upper arm graft. This subsequently occluded and is not salvageable.  He then underwent Left brachiocephalic AV fistula by Dr. Scot Dock on 01/14/2016.    He has a history of right LE  Right common femoral artery to below-knee popliteal artery bypass with non-reversed translocated saphenous vein graft secondary to critical limb ischemia right lower extremity with nonhealing wound on 03/31/2016.  He developed a non healing wound that failed conservative management that ended in right BKA performed by Dr. Sharol Given on 04/26/2017.  He has been lost to f/u for surveillance.   He denise symptoms of claudication such as calf pain and no rest pain.  He has no history of left foot non healing wounds.  He is here today to discuss his non maturing left AV fistula.  He states he has used his right [facility] for the past 2 years.  He did not have any labs scheduled today prior to this visit.    Past Medical History:  Diagnosis Date  . 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 Allergic rhinitis   . Anemia   . Arthritis   . Chronic systolic heart failure ([place])    a. ECHO (12/2012) EF 25-30%, HK entireanteroseptal myocardium //  b.  EF 25%, diffuse HK, grade 1 diastolic dysfunction, MAC, mild LAE, normal RVSF, trivial pericardial effusion  . COPD (chronic obstructive pulmonary disease) ([place])   . Diabetes mellitus type II   . Diabetic nephropathy ([place])   . Diabetic neuropathy ([place])    . ESRD on hemodialysis [facility])    started HD June 2017, goes to [facility] HD unit, Dr Hinda Lenis  . History of cardiac catheterization    a.Myoview 1/15:  There is significant left ventricular dysfunction. There may be slight scar at the apex. There is no significant ischemia. LV Ejection Fraction: 27%  //  b. RHC/LHC (1/15) with mean RA 6, PA 47/22 mean 33, mean PCWP 20, PVR 2.5 WU, CI 2.5; 80% dLAD stenosis, 70% diffuse large D.   . History of kidney stones   . Hyperlipidemia   . Hypertension   . Kidney stones   . NICM (nonischemic cardiomyopathy) ([place])    Primarily nonischemic. Echo (12/14) with EF 25-30%. Echo (3/15) with EF 25%, mild to moderately dilated LV, normal RV size and systolic function.   . Pneumonia   . Urethral stricture     Past Surgical History:  Procedure Laterality Date  . ABDOMINAL AORTOGRAM W/LOWER EXTREMITY N/A 03/30/2016   Procedure: Abdominal Aortogram w/Lower Extremity;  Surgeon: Angelia Mould, MD;  Location: [place] [facility];  Service: Cardiovascular;  Laterality: N/A;  . AMPUTATION Right 04/26/2016   Procedure: Right Below Knee Amputation;  Surgeon: Newt Minion, MD;  Location: [place];  Service: Orthopedics;  Laterality: Right;  . AV FISTULA PLACEMENT Right 09/08/2015   Procedure: INSERTION OF 4-66m x 45cm  ARTERIOVENOUS (AV) GORE-TEX GRAFT RIGHT UPPER  ARM;  Surgeon: CHarrell Gave  Nicole Cella, MD;  Location: [place];  Service: Vascular;  Laterality: Right;  . AV FISTULA PLACEMENT Left 01/14/2016   Procedure: CREATION OF LEFT UPPER ARM ARTERIOVENOUS FISTULA;  Surgeon: Angelia Mould, MD;  Location: [place];  Service: Vascular;  Laterality: Left;  . BASCILIC VEIN TRANSPOSITION Right 08/22/2014   Procedure: RIGHT UPPER ARM BASCILIC VEIN TRANSPOSITION;  Surgeon: Angelia Mould, MD;  Location: [place];  Service: Vascular;  Laterality: Right;  . BELOW KNEE LEG AMPUTATION Right 04/26/2016  . CARDIAC CATHETERIZATION    . CARDIAC DEFIBRILLATOR PLACEMENT   06/27/2013   Sub Q       BY DR Caryl Comes  . COLONOSCOPY WITH PROPOFOL N/A 07/22/2015   Procedure: COLONOSCOPY WITH PROPOFOL;  Surgeon: Doran Stabler, MD;  [facility];  Service: Gastroenterology;  Laterality: N/A;  . FEMORAL-POPLITEAL BYPASS GRAFT Right 03/31/2016   Procedure: BYPASS GRAFT FEMORAL-POPLITEAL ARTERY USING RIGHT GREATER SAPHENOUS NONREVERSED VEIN;  Surgeon: Angelia Mould, MD;  Location: [place];  Service: Vascular;  Laterality: Right;  . HERNIA REPAIR    . I&D EXTREMITY Right 03/31/2016   Procedure: IRRIGATION AND DEBRIDEMENT FOOT;  Surgeon: Angelia Mould, MD;  Location: [place];  Service: Vascular;  Laterality: Right;  . IMPLANTABLE CARDIOVERTER DEFIBRILLATOR IMPLANT N/A 06/27/2013   Procedure: SUB Q ICD;  Surgeon: Deboraha Sprang, MD;  Location: [facility] CATH LAB;  Service: Cardiovascular;  Laterality: N/A;  . INTRAOPERATIVE ARTERIOGRAM Right 03/31/2016   Procedure: INTRA OPERATIVE ARTERIOGRAM;  Surgeon: Angelia Mould, MD;  Location: [place];  Service: Vascular;  Laterality: Right;  . IR GENERIC HISTORICAL Right 11/30/2015   IR THROMBECTOMY AV FISTULA W/THROMBOLYSIS/PTA INC/SHUNT/IMG RIGHT 11/30/2015 Aletta Edouard, MD [facility]  . IR GENERIC HISTORICAL  11/30/2015   IR US GUIDE VASC ACCESS RIGHT 11/30/2015 Aletta Edouard, MD [facility]  . IR GENERIC HISTORICAL Right 12/15/2015   IR THROMBECTOMY AV FISTULA W/THROMBOLYSIS/PTA/STENT INC/SHUNT/IMG RT 12/15/2015 Arne Cleveland, MD [facility]  . IR GENERIC HISTORICAL  12/15/2015   IR US GUIDE VASC ACCESS RIGHT 12/15/2015 Arne Cleveland, MD [facility]  . IR GENERIC HISTORICAL  12/28/2015   IR FLUORO GUIDE CV LINE RIGHT 12/28/2015 Marybelle Killings, MD [facility]  . IR GENERIC HISTORICAL  12/28/2015   IR US GUIDE VASC ACCESS RIGHT 12/28/2015 Marybelle Killings, MD [facility]  . LEFT A ND RIGHT HEART CATH  01/30/2013   DR Sung Amabile  . LEFT AND RIGHT HEART CATHETERIZATION WITH CORONARY ANGIOGRAM N/A 01/30/2013    Procedure: LEFT AND RIGHT HEART CATHETERIZATION WITH CORONARY ANGIOGRAM;  Surgeon: Jolaine Artist, MD;  Location: [facility] CATH LAB;  Service: Cardiovascular;  Laterality: N/A;  . PERIPHERAL VASCULAR CATHETERIZATION Right 01/26/2015   Procedure: A/V Fistulagram;  Surgeon: Angelia Mould, MD;  Location: [place] [facility];  Service: Cardiovascular;  Laterality: Right;  . reapea urethral surgery for recurrent obstruction  2011  . TOTAL KNEE ARTHROPLASTY Right 2007  . VEIN HARVEST Right 03/31/2016   Procedure: RIGHT GREATER SAPHENOUS VEIN HARVEST;  Surgeon: Angelia Mould, MD;  Location: [facility] OR;  Service: Vascular;  Laterality: Right;     Social History Social History   Tobacco Use  . Smoking status: Former Smoker    Packs/day: 2.00    Years: 32.00    Pack years: 64.00    Types: Cigarettes    Last attempt to quit: 05/11/2010    Years since quitting: 7.5  . Smokeless tobacco: Never Used  Substance Use Topics  . Alcohol  use: No  . Drug use: No    Family History Family History  Problem Relation Age of Onset  . Bladder Cancer Mother   . Alcohol abuse Father   . Melanoma Father   . Stroke Maternal Grandmother   . Heart Problems Maternal Grandmother        unknown  . Diabetes Maternal Grandmother   . Heart disease Maternal Grandfather   . Prostate cancer Maternal Grandfather     Allergies  Allergies  Allergen Reactions  . Morphine Sulfate Rash and Other (See Comments)    Itches all over, red spots     Current Outpatient Medications  Medication Sig Dispense Refill  . aspirin EC 81 MG tablet Take 81 mg by mouth daily.     [name] atorvastatin (LIPITOR) 80 MG tablet Take 1 tablet (80 mg total) by mouth at bedtime. 90 tablet 3  . azelastine (ASTELIN) 0.1 % nasal spray Place 2 sprays into both nostrils 3 (three) times daily as needed for rhinitis. Use in each nostril as directed 30 mL 12  . carvedilol (COREG) 12.5 MG tablet Take 1 tablet (12.5 mg total) by mouth 2 (two)  times daily with a meal. 180 tablet 3  . fenofibrate 160 MG tablet Take 1 tablet (160 mg total) by mouth daily. 90 tablet 3  . fexofenadine (ALLEGRA) 180 MG tablet Take 180 mg by mouth daily.    . furosemide (LASIX) 40 MG tablet Take 3 tablets (120 mg total) by mouth daily. Take 3 tablets (120 mg) by mouth daily 270 tablet 3  . gabapentin (NEURONTIN) 300 MG capsule Take 1 capsule (300 mg total) by mouth 3 (three) times daily. 270 capsule 3  . glucose blood test strip Check 1 time daily. E11.9 One Touch Ultra Blue Test Strips 100 each 3  . Insulin Glargine (BASAGLAR KWIKPEN) 100 UNIT/ML SOPN Inject 0.44 mLs (44 Units total) into the skin at bedtime. (Patient taking differently: Inject 55 Units into the skin at bedtime. ) 5 pen 3  . insulin lispro (HUMALOG) 100 UNIT/ML injection Inject 0-0.14 mLs (0-14 Units total) into the skin 3 (three) times daily before meals. Dose per sliding scale 3 vial 5  . Insulin Pen Needle (BD PEN NEEDLE NANO U/F) 32G X 4 MM MISC USE ONCE DAILY 90 each 1  . lanthanum (FOSRENOL) 1000 MG chewable tablet Chew 1,000 mg by mouth 3 (three) times daily with meals. Take one tablet after each meal.     . multivitamin (RENA-VIT) TABS tablet Take 1 tablet by mouth daily.  99  . nystatin cream (MYCOSTATIN) Apply 1 application topically 2 (two) times daily. 30 g 2  . Olopatadine HCl (PATADAY) 0.2 % SOLN Place 1 drop into both eyes daily as needed (for allergies).      No current facility-administered medications for this visit.     ROS:   General:  No weight loss, Fever, chills  HEENT: No recent headaches, no nasal bleeding, no visual changes, no sore throat  Neurologic: No dizziness, blackouts, seizures. No recent symptoms of stroke or mini- stroke. No recent episodes of slurred speech, or temporary blindness.  Cardiac: No recent episodes of chest pain/pressure, no shortness of breath at rest.  No shortness of breath with exertion.  Denies history of atrial fibrillation or  irregular heartbeat  Vascular: No history of rest pain in feet.  No history of claudication.  No history of non-healing ulcer, No history of DVT   Pulmonary: No home oxygen, no productive cough,  no hemoptysis,  No asthma or wheezing  Musculoskeletal:  [ ]  Arthritis, [ ]  Low back pain,  [ ]  Joint pain  Hematologic:No history of hypercoagulable state.  No history of easy bleeding.  No history of anemia  Gastrointestinal: No hematochezia or melena,  No gastroesophageal reflux, no trouble swallowing  Urinary: [ ]  chronic Kidney disease, [x ] on HD - [ ]  MWF or [x ] TTHS, [ ]  Burning with urination, [ ]  Frequent urination, [ ]  Difficulty urinating;   Skin: No rashes  Psychological: No history of anxiety,  No history of depression   Physical Examination  Vitals:   11/15/17 1551  BP: (!) 162/70  Pulse: 77  Resp: 16  Temp: 98.7 F (37.1 C)  TempSrc: Oral  SpO2: 98%  Weight: 211 lb (95.7 kg)  Height: 5' 10"  (1.778 m)    Body mass index is 30.28 kg/m.  General:  Alert and oriented, no acute distress HEENT: Normal, normocephalic Neck: positive left carotid bruit or JVD Pulmonary: Clear to auscultation bilaterally Cardiac: Regular Rate and Rhythm positive murmur Gastrointestinal: Soft, non-tender, non-distended, no mass, +BS Skin: No rash Extremity Pulses:  2+ radial, brachial pulses bilaterally, femoral, left DP palpable with doppler signals DP/PT/peroneal Musculoskeletal: right BKA or edema  Neurologic: Upper and left lower extremity motor 5/5 and symmetric  DATA: none    ASSESSMENT:  ESRD Failure to mature left UE AV fistula PAD s/p right Fem-pop with GSV now BKA Carotid bruit right carotid   PLAN: I have scheduled him for left UE fistula duplex and carotid duplex B.  He needs a new baseline ABI for the left LE.  Once he has these studies we will have him f/u with Dr. Scot Dock.  He wants to have access for HD and to be able to get rid of his [facility] if possible in the  near future.    Roxy Horseman PA-C [facility] [place] Office: [phone]

## 2017-11-15 NOTE — Patient Instructions (Signed)
Food Choices for Gastroesophageal Reflux Disease, Adult When you have gastroesophageal reflux disease (GERD), the foods you eat and your eating habits are very important. Choosing the right foods can help ease the discomfort of GERD. Consider working with a diet and nutrition specialist (dietitian) to help you make healthy food choices. What general guidelines should I follow? Eating plan  Choose healthy foods low in fat, such as fruits, vegetables, whole grains, low-fat dairy products, and lean meat, fish, and poultry.  Eat frequent, small meals instead of three large meals each day. Eat your meals slowly, in a relaxed setting. Avoid bending over or lying down until 2-3 hours after eating.  Limit high-fat foods such as fatty meats or fried foods.  Limit your intake of oils, butter, and shortening to less than 8 teaspoons each day.  Avoid the following: ? Foods that cause symptoms. These may be different for different people. Keep a food diary to keep track of foods that cause symptoms. ? Alcohol. ? Drinking large amounts of liquid with meals. ? Eating meals during the 2-3 hours before bed.  Cook foods using methods other than frying. This may include baking, grilling, or broiling. Lifestyle   Maintain a healthy weight. Ask your health care provider what weight is healthy for you. If you need to lose weight, work with your health care provider to do so safely.  Exercise for at least 30 minutes on 5 or more days each week, or as told by your health care provider.  Avoid wearing clothes that fit tightly around your waist and chest.  Do not use any products that contain nicotine or tobacco, such as cigarettes and e-cigarettes. If you need help quitting, ask your health care provider.  Sleep with the head of your bed raised. Use a wedge under the mattress or blocks under the bed frame to raise the head of the bed. What foods are not recommended? The items listed may not be a complete  list. Talk with your dietitian about what dietary choices are best for you. Grains Pastries or quick breads with added fat. French toast. Vegetables Deep fried vegetables. French fries. Any vegetables prepared with added fat. Any vegetables that cause symptoms. For some people this may include tomatoes and tomato products, chili peppers, onions and garlic, and horseradish. Fruits Any fruits prepared with added fat. Any fruits that cause symptoms. For some people this may include citrus fruits, such as oranges, grapefruit, pineapple, and lemons. Meats and other protein foods High-fat meats, such as fatty beef or pork, hot dogs, ribs, ham, sausage, salami and bacon. Fried meat or protein, including fried fish and fried chicken. Nuts and nut butters. Dairy Whole milk and chocolate milk. Sour cream. Cream. Ice cream. Cream cheese. Milk shakes. Beverages Coffee and tea, with or without caffeine. Carbonated beverages. Sodas. Energy drinks. Fruit juice made with acidic fruits (such as orange or grapefruit). Tomato juice. Alcoholic drinks. Fats and oils Butter. Margarine. Shortening. Ghee. Sweets and desserts Chocolate and cocoa. Donuts. Seasoning and other foods Pepper. Peppermint and spearmint. Any condiments, herbs, or seasonings that cause symptoms. For some people, this may include curry, hot sauce, or vinegar-based salad dressings. Summary  When you have gastroesophageal reflux disease (GERD), food and lifestyle choices are very important to help ease the discomfort of GERD.  Eat frequent, small meals instead of three large meals each day. Eat your meals slowly, in a relaxed setting. Avoid bending over or lying down until 2-3 hours after eating.  Limit high-fat   foods such as fatty meat or fried foods. This information is not intended to replace advice given to you by your health care provider. Make sure you discuss any questions you have with your health care provider. Document Released:  12/27/2004 Document Revised: 12/29/2015 Document Reviewed: 12/29/2015 Elsevier Interactive Patient Education  2018 [employer].  Avoid eating within 2-3 hours of bedtime Gradually reduce caffeine use Elevate head of bed 4 to 6 inches Start Pepcid 10 mg once daily Avoid mints, chocolate, high citrus foods,etc,    Start back the Humalog with sliding scale three times daily with meals. Let's plan on three month follow up.

## 2017-11-16 ENCOUNTER — Other Ambulatory Visit: Payer: Self-pay

## 2017-11-16 DIAGNOSIS — D631 Anemia in chronic kidney disease: Secondary | ICD-10-CM | POA: Diagnosis not present

## 2017-11-16 DIAGNOSIS — I6521 Occlusion and stenosis of right carotid artery: Secondary | ICD-10-CM

## 2017-11-16 DIAGNOSIS — Z992 Dependence on renal dialysis: Secondary | ICD-10-CM | POA: Diagnosis not present

## 2017-11-16 DIAGNOSIS — D509 Iron deficiency anemia, unspecified: Secondary | ICD-10-CM | POA: Diagnosis not present

## 2017-11-16 DIAGNOSIS — N186 End stage renal disease: Secondary | ICD-10-CM

## 2017-11-16 DIAGNOSIS — N2581 Secondary hyperparathyroidism of renal origin: Secondary | ICD-10-CM | POA: Diagnosis not present

## 2017-11-18 DIAGNOSIS — D509 Iron deficiency anemia, unspecified: Secondary | ICD-10-CM | POA: Diagnosis not present

## 2017-11-18 DIAGNOSIS — N186 End stage renal disease: Secondary | ICD-10-CM | POA: Diagnosis not present

## 2017-11-18 DIAGNOSIS — N2581 Secondary hyperparathyroidism of renal origin: Secondary | ICD-10-CM | POA: Diagnosis not present

## 2017-11-18 DIAGNOSIS — D631 Anemia in chronic kidney disease: Secondary | ICD-10-CM | POA: Diagnosis not present

## 2017-11-18 DIAGNOSIS — Z992 Dependence on renal dialysis: Secondary | ICD-10-CM | POA: Diagnosis not present

## 2017-11-20 ENCOUNTER — Encounter (INDEPENDENT_AMBULATORY_CARE_PROVIDER_SITE_OTHER): Payer: Self-pay | Admitting: Orthopedic Surgery

## 2017-11-20 ENCOUNTER — Ambulatory Visit (INDEPENDENT_AMBULATORY_CARE_PROVIDER_SITE_OTHER): Payer: Medicare Other | Admitting: Physician Assistant

## 2017-11-20 VITALS — Ht 70.0 in | Wt 211.0 lb

## 2017-11-20 DIAGNOSIS — Z992 Dependence on renal dialysis: Secondary | ICD-10-CM

## 2017-11-20 DIAGNOSIS — N186 End stage renal disease: Secondary | ICD-10-CM

## 2017-11-20 DIAGNOSIS — I6521 Occlusion and stenosis of right carotid artery: Secondary | ICD-10-CM

## 2017-11-20 DIAGNOSIS — E1142 Type 2 diabetes mellitus with diabetic polyneuropathy: Secondary | ICD-10-CM | POA: Diagnosis not present

## 2017-11-20 DIAGNOSIS — Z89511 Acquired absence of right leg below knee: Secondary | ICD-10-CM | POA: Diagnosis not present

## 2017-11-20 DIAGNOSIS — B351 Tinea unguium: Secondary | ICD-10-CM | POA: Diagnosis not present

## 2017-11-20 NOTE — Progress Notes (Signed)
Office Visit Note   Patient: Anthony Bullock           Date of Birth: 1960-06-05           MRN: [id number] Visit Date: 11/20/2017              Requested by: [name], MD [place], [place] [address] PCP: [name], MD  Chief Complaint  Patient presents with  . Left Foot - Follow-up  . Right Leg - Follow-up    Right leg BKA      HPI: The patient is a 57 year old male who is seen status post right transtibial amputation.  He reports that he is doing well and wearing his prosthesis and has no ulceration or issues with the right transtibial amputation.  He does have onychomycotic nails of the left foot and request trim of his toenails.  Assessment & Plan: Visit Diagnoses:  1. Status post unilateral below knee amputation, right ([place])   2. Onychomycosis   3. Diabetic polyneuropathy associated with type 2 diabetes mellitus ([place])   4. ESRD (end stage renal disease) on dialysis [facility])     Plan: The toenails were trimmed x5 on the left foot.  The right BKA is well-healed and he is ambulating without difficulty with his prosthesis.  He will follow-up in 3 months or sooner should he have difficulties.  Follow-Up Instructions: Return in about 3 months (around 02/20/2018).   Ortho Exam  Patient is alert, oriented, no adenopathy, well-dressed, normal affect, normal respiratory effort. The right below the knee amputation site is well-healed without ulceration or edema or signs of cellulitis.  The left foot has onychomycotic nails and the nails were trimmed x5.  There are no ulcers and no signs of cellulitis or infection.  He has good pedal pulses.  Imaging: No results found. No images are attached to the encounter.  Labs: Lab Results  Component Value Date   HGBA1C 10.7 (A) 11/07/2017   HGBA1C 11.0 (H) 01/25/2017   HGBA1C 13.8 10/24/2016   ESRSEDRATE 94 (H) 03/30/2016   ESRSEDRATE 18 11/30/2012   CRP 16.1 (H) 03/30/2016   REPTSTATUS 04/05/2016  FINAL 03/31/2016   REPTSTATUS 04/04/2016 FINAL 03/31/2016   GRAMSTAIN  03/31/2016    RARE WBC PRESENT, PREDOMINANTLY PMN NO ORGANISMS SEEN    CULT NO ANAEROBES ISOLATED 03/31/2016   CULT  03/31/2016    RARE STENOTROPHOMONAS MALTOPHILIA RARE ENTEROBACTER SPECIES    LABORGA STENOTROPHOMONAS MALTOPHILIA 03/31/2016   LABORGA ENTEROBACTER SPECIES 03/31/2016     Lab Results  Component Value Date   ALBUMIN 3.2 (L) 01/25/2017   ALBUMIN 2.5 (L) 04/27/2016   ALBUMIN 2.3 (L) 04/04/2016    Body mass index is 30.28 kg/m.  Orders:  No orders of the defined types were placed in this encounter.  No orders of the defined types were placed in this encounter.    Procedures: No procedures performed  Clinical Data: No additional findings.  ROS:  All other systems negative, except as noted in the HPI. Review of Systems  Objective: Vital Signs: Ht _0  (1.778 m)   Wt 211 lb (95.7 kg)   BMI 30.28 kg/m   Specialty Comments:  No specialty comments available.  PMFS History: Patient Active Problem List   Diagnosis Date Noted  . Onychomycosis 08/16/2016  . Status post unilateral below knee amputation, right ([place]) 04/26/2016  . Bilateral carotid bruits 03/30/2016  . Diabetes mellitus with complication ([place])   . Anemia due to  chronic kidney disease   . ESRD (end stage renal disease) on dialysis ([place]) 11/30/2015  . Acute respiratory failure with hypoxia ([place]) 11/29/2015  . At high risk for falls 09/29/2014  . Peripheral vascular disease ([place]) 09/29/2014  . Osteoarthritis of both knees 01/07/2014  . Osteoarthritis of multiple joints 10/06/2013  . NICM (nonischemic cardiomyopathy) ([place]) 06/27/2013  . CAD (coronary artery disease) 03/13/2013  . Abnormal nuclear stress test 01/27/2013  . Chronic systolic congestive heart failure ([place]) 01/27/2013  . COPD (chronic obstructive pulmonary disease) ([place]) 12/27/2012  . Diabetic polyneuropathy associated with type 2 diabetes mellitus  ([place]) 02/10/2012  . PYELITIS/PYELONEPHRITIS DISEASES CLASSIFIED ELSW 09/28/2009  . URINARY CALCULUS 09/28/2009  . Adjustment disorder with depressed mood 08/21/2009  . Poorly controlled type II diabetes mellitus with renal complication ([place]) 67/34/1937  . Hyperlipidemia 04/14/2008  . Essential hypertension 04/14/2008  . ALLERGIC RHINITIS 04/14/2008   Past Medical History:  Diagnosis Date  . 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 Allergic rhinitis   . Anemia   . Arthritis   . Chronic systolic heart failure ([place])    a. ECHO (12/2012) EF 25-30%, HK entireanteroseptal myocardium //  b.  EF 25%, diffuse HK, grade 1 diastolic dysfunction, MAC, mild LAE, normal RVSF, trivial pericardial effusion  . COPD (chronic obstructive pulmonary disease) ([place])   . Diabetes mellitus type II   . Diabetic nephropathy ([place])   . Diabetic neuropathy ([place])   . ESRD on hemodialysis [facility])    started HD June 2017, goes to [facility] unit, Dr Hinda Lenis  . History of cardiac catheterization    a.Myoview 1/15:  There is significant left ventricular dysfunction. There may be slight scar at the apex. There is no significant ischemia. LV Ejection Fraction: 27%  //  b. RHC/LHC (1/15) with mean RA 6, PA 47/22 mean 33, mean PCWP 20, PVR 2.5 WU, CI 2.5; 80% dLAD stenosis, 70% diffuse large D.   . History of kidney stones   . Hyperlipidemia   . Hypertension   . Kidney stones   . NICM (nonischemic cardiomyopathy) ([place])    Primarily nonischemic. Echo (12/14) with EF 25-30%. Echo (3/15) with EF 25%, mild to moderately dilated LV, normal RV size and systolic function.   . Pneumonia   . Urethral stricture     Family History  Problem Relation Age of Onset  . Bladder Cancer Mother   . Alcohol abuse Father   . Melanoma Father   . Stroke Maternal Grandmother   . Heart Problems Maternal Grandmother        unknown  . Diabetes Maternal Grandmother   . Heart disease Maternal  Grandfather   . Prostate cancer Maternal Grandfather     Past Surgical History:  Procedure Laterality Date  . ABDOMINAL AORTOGRAM W/LOWER EXTREMITY N/A 03/30/2016   Procedure: Abdominal Aortogram w/Lower Extremity;  Surgeon: Angelia Mould, MD;  Location: [place] [facility];  Service: Cardiovascular;  Laterality: N/A;  . AMPUTATION Right 04/26/2016   Procedure: Right Below Knee Amputation;  Surgeon: Newt Minion, MD;  Location: [place];  Service: Orthopedics;  Laterality: Right;  . AV FISTULA PLACEMENT Right 09/08/2015   Procedure: INSERTION OF 4-80m x 45cm  ARTERIOVENOUS (AV) GORE-TEX GRAFT RIGHT UPPER  ARM;  Surgeon: CAngelia Mould MD;  [place]  Service: Vascular;  Laterality: Right;  . AV FISTULA PLACEMENT Left 01/14/2016   Procedure: CREATION OF LEFT UPPER ARM ARTERIOVENOUS FISTULA;  Surgeon: CAngelia Mould  MD;  Location: [place];  Service: Vascular;  Laterality: Left;  . BASCILIC VEIN TRANSPOSITION Right 08/22/2014   Procedure: RIGHT UPPER ARM BASCILIC VEIN TRANSPOSITION;  Surgeon: Angelia Mould, MD;  Location: [place];  Service: Vascular;  Laterality: Right;  . BELOW KNEE LEG AMPUTATION Right 04/26/2016  . CARDIAC CATHETERIZATION    . CARDIAC DEFIBRILLATOR PLACEMENT  06/27/2013   Sub Q       BY DR Caryl Comes  . COLONOSCOPY WITH PROPOFOL N/A 07/22/2015   Procedure: COLONOSCOPY WITH PROPOFOL;  Surgeon: Doran Stabler, MD;  Location: [facility];  Service: Gastroenterology;  Laterality: N/A;  . FEMORAL-POPLITEAL BYPASS GRAFT Right 03/31/2016   Procedure: BYPASS GRAFT FEMORAL-POPLITEAL ARTERY USING RIGHT GREATER SAPHENOUS NONREVERSED VEIN;  Surgeon: Angelia Mould, MD;  Location: [place];  Service: Vascular;  Laterality: Right;  . HERNIA REPAIR    . I&D EXTREMITY Right 03/31/2016   Procedure: IRRIGATION AND DEBRIDEMENT FOOT;  Surgeon: Angelia Mould, MD;  Location: [place];  Service: Vascular;  Laterality: Right;  . IMPLANTABLE CARDIOVERTER DEFIBRILLATOR  IMPLANT N/A 06/27/2013   Procedure: SUB Q ICD;  Surgeon: Deboraha Sprang, MD;  Location: [facility] CATH LAB;  Service: Cardiovascular;  Laterality: N/A;  . INTRAOPERATIVE ARTERIOGRAM Right 03/31/2016   Procedure: INTRA OPERATIVE ARTERIOGRAM;  Surgeon: Angelia Mould, MD;  Location: [place];  Service: Vascular;  Laterality: Right;  . IR GENERIC HISTORICAL Right 11/30/2015   IR THROMBECTOMY AV FISTULA W/THROMBOLYSIS/PTA INC/SHUNT/IMG RIGHT 11/30/2015 Aletta Edouard, MD [facility]  . IR GENERIC HISTORICAL  11/30/2015   IR US GUIDE VASC ACCESS RIGHT 11/30/2015 Aletta Edouard, MD [facility]  . IR GENERIC HISTORICAL Right 12/15/2015   IR THROMBECTOMY AV FISTULA W/THROMBOLYSIS/PTA/STENT INC/SHUNT/IMG RT 12/15/2015 Arne [place], MD [facility]  . IR GENERIC HISTORICAL  12/15/2015   IR US GUIDE VASC ACCESS RIGHT 12/15/2015 Arne [place], MD [facility]  . IR GENERIC HISTORICAL  12/28/2015   IR FLUORO GUIDE CV LINE RIGHT 12/28/2015 Marybelle Killings, MD [facility]  . IR GENERIC HISTORICAL  12/28/2015   IR US GUIDE VASC ACCESS RIGHT 12/28/2015 Marybelle Killings, MD [facility]  . LEFT A ND RIGHT HEART CATH  01/30/2013   DR Sung Amabile  . LEFT AND RIGHT HEART CATHETERIZATION WITH CORONARY ANGIOGRAM N/A 01/30/2013   Procedure: LEFT AND RIGHT HEART CATHETERIZATION WITH CORONARY ANGIOGRAM;  Surgeon: Jolaine Artist, MD;  Location: [facility] CATH LAB;  Service: Cardiovascular;  Laterality: N/A;  . PERIPHERAL VASCULAR CATHETERIZATION Right 01/26/2015   Procedure: A/V Fistulagram;  Surgeon: Angelia Mould, MD;  Location: [place] CV [facility];  Service: Cardiovascular;  Laterality: Right;  . reapea urethral surgery for recurrent obstruction  2011  . TOTAL KNEE ARTHROPLASTY Right 2007  . VEIN HARVEST Right 03/31/2016   Procedure: RIGHT GREATER SAPHENOUS VEIN HARVEST;  Surgeon: Angelia Mould, MD;  Location: [place];  Service: Vascular;  Laterality: Right;   Social History   Occupational History  . Not on file   Tobacco Use  . Smoking status: Former Smoker    Packs/day: 2.00    Years: 32.00    Pack years: 64.00    Types: Cigarettes    Last attempt to quit: 05/11/2010    Years since quitting: 7.5  . Smokeless tobacco: Never Used  Substance and Sexual Activity  . Alcohol use: No  . Drug use: No  . Sexual activity: Yes

## 2017-11-21 DIAGNOSIS — D509 Iron deficiency anemia, unspecified: Secondary | ICD-10-CM | POA: Diagnosis not present

## 2017-11-21 DIAGNOSIS — Z992 Dependence on renal dialysis: Secondary | ICD-10-CM | POA: Diagnosis not present

## 2017-11-21 DIAGNOSIS — N186 End stage renal disease: Secondary | ICD-10-CM | POA: Diagnosis not present

## 2017-11-21 DIAGNOSIS — D631 Anemia in chronic kidney disease: Secondary | ICD-10-CM | POA: Diagnosis not present

## 2017-11-21 DIAGNOSIS — N2581 Secondary hyperparathyroidism of renal origin: Secondary | ICD-10-CM | POA: Diagnosis not present

## 2017-11-23 DIAGNOSIS — D631 Anemia in chronic kidney disease: Secondary | ICD-10-CM | POA: Diagnosis not present

## 2017-11-23 DIAGNOSIS — N2581 Secondary hyperparathyroidism of renal origin: Secondary | ICD-10-CM | POA: Diagnosis not present

## 2017-11-23 DIAGNOSIS — Z992 Dependence on renal dialysis: Secondary | ICD-10-CM | POA: Diagnosis not present

## 2017-11-23 DIAGNOSIS — D509 Iron deficiency anemia, unspecified: Secondary | ICD-10-CM | POA: Diagnosis not present

## 2017-11-23 DIAGNOSIS — N186 End stage renal disease: Secondary | ICD-10-CM | POA: Diagnosis not present

## 2017-11-25 DIAGNOSIS — N186 End stage renal disease: Secondary | ICD-10-CM | POA: Diagnosis not present

## 2017-11-25 DIAGNOSIS — D509 Iron deficiency anemia, unspecified: Secondary | ICD-10-CM | POA: Diagnosis not present

## 2017-11-25 DIAGNOSIS — Z992 Dependence on renal dialysis: Secondary | ICD-10-CM | POA: Diagnosis not present

## 2017-11-25 DIAGNOSIS — N2581 Secondary hyperparathyroidism of renal origin: Secondary | ICD-10-CM | POA: Diagnosis not present

## 2017-11-25 DIAGNOSIS — D631 Anemia in chronic kidney disease: Secondary | ICD-10-CM | POA: Diagnosis not present

## 2017-11-28 DIAGNOSIS — N2581 Secondary hyperparathyroidism of renal origin: Secondary | ICD-10-CM | POA: Diagnosis not present

## 2017-11-28 DIAGNOSIS — D631 Anemia in chronic kidney disease: Secondary | ICD-10-CM | POA: Diagnosis not present

## 2017-11-28 DIAGNOSIS — N186 End stage renal disease: Secondary | ICD-10-CM | POA: Diagnosis not present

## 2017-11-28 DIAGNOSIS — Z992 Dependence on renal dialysis: Secondary | ICD-10-CM | POA: Diagnosis not present

## 2017-11-28 DIAGNOSIS — D509 Iron deficiency anemia, unspecified: Secondary | ICD-10-CM | POA: Diagnosis not present

## 2017-11-29 ENCOUNTER — Encounter: Payer: Self-pay | Admitting: Vascular Surgery

## 2017-11-29 ENCOUNTER — Encounter: Payer: Self-pay | Admitting: *Deleted

## 2017-11-29 ENCOUNTER — Ambulatory Visit (INDEPENDENT_AMBULATORY_CARE_PROVIDER_SITE_OTHER): Payer: Medicare Other | Admitting: Vascular Surgery

## 2017-11-29 ENCOUNTER — Ambulatory Visit (HOSPITAL_COMMUNITY)
Admission: RE | Admit: 2017-11-29 | Discharge: 2017-11-29 | Disposition: A | Discharge status: Home / Self Care | Payer: Medicare Other | Source: Ambulatory Visit | Attending: Vascular Surgery | Admitting: Vascular Surgery

## 2017-11-29 ENCOUNTER — Ambulatory Visit (INDEPENDENT_AMBULATORY_CARE_PROVIDER_SITE_OTHER)
Admission: RE | Admit: 2017-11-29 | Discharge: 2017-11-29 | Disposition: A | Discharge status: Home / Self Care | Payer: Medicare Other | Source: Ambulatory Visit | Attending: Vascular Surgery | Admitting: Vascular Surgery

## 2017-11-29 ENCOUNTER — Other Ambulatory Visit: Payer: Self-pay | Admitting: *Deleted

## 2017-11-29 ENCOUNTER — Other Ambulatory Visit: Payer: Self-pay

## 2017-11-29 VITALS — BP 184/80 | HR 74 | Temp 97.0°F | Resp 16 | Ht 70.0 in | Wt 204.0 lb

## 2017-11-29 DIAGNOSIS — Z992 Dependence on renal dialysis: Secondary | ICD-10-CM

## 2017-11-29 DIAGNOSIS — N186 End stage renal disease: Secondary | ICD-10-CM

## 2017-11-29 DIAGNOSIS — I6523 Occlusion and stenosis of bilateral carotid arteries: Secondary | ICD-10-CM | POA: Diagnosis not present

## 2017-11-29 DIAGNOSIS — I6521 Occlusion and stenosis of right carotid artery: Secondary | ICD-10-CM | POA: Insufficient documentation

## 2017-11-29 NOTE — Progress Notes (Signed)
Patient name: Anthony Bullock MRN: [id number] DOB: [birth year] Sex: male  REASON FOR VISIT:   Follow-up.  HPI:   Anthony Bullock is a pleasant 57 y.o. male who was seen by Laurence Slate, PA on 11/15/2017.  He has had a previous right basilic vein transposition in 2016 which failed and subsequently had an AV graft placed on the right which failed.  Most recently I placed a left brachiocephalic fistula on 02/18/32.  When he was seen in the office on 11/15/2017 the fistula was not maturing so he was set up for a fistulogram and an office visit with me.  In addition, this patient has undergone a right common femoral artery to below-knee popliteal artery bypass with a vein graft for critical limb ischemia on 03/31/2016.  He did up with a right below the knee amputation.  Patient has been using his tunneled dialysis catheter for approximately 2 years now.  The fistula has not matured adequately.  He dialyzes on Tuesdays Thursdays and Saturdays.  At his last visit a left carotid bruit was detected and a carotid duplex scan was scheduled.  The patient is right-handed.  He denies any history of stroke, TIAs, expressive or receptive aphasia, or amaurosis fugax.  He is on aspirin and is on a statin.  Past Medical History:  Diagnosis Date  . 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 Allergic rhinitis   . Anemia   . Arthritis   . Chronic systolic heart failure ([place])    a. ECHO (12/2012) EF 25-30%, HK entireanteroseptal myocardium //  b.  EF 25%, diffuse HK, grade 1 diastolic dysfunction, MAC, mild LAE, normal RVSF, trivial pericardial effusion  . COPD (chronic obstructive pulmonary disease) ([place])   . Diabetes mellitus type II   . Diabetic nephropathy ([place])   . Diabetic neuropathy ([place])   . ESRD on hemodialysis [facility])    started HD June 2017, goes to [facility] HD unit, Dr Hinda Lenis  . History of cardiac catheterization    a.Myoview 1/15:  There is significant left  ventricular dysfunction. There may be slight scar at the apex. There is no significant ischemia. LV Ejection Fraction: 27%  //  b. RHC/LHC (1/15) with mean RA 6, PA 47/22 mean 33, mean PCWP 20, PVR 2.5 WU, CI 2.5; 80% dLAD stenosis, 70% diffuse large D.   . History of kidney stones   . Hyperlipidemia   . Hypertension   . Kidney stones   . NICM (nonischemic cardiomyopathy) ([place])    Primarily nonischemic. Echo (12/14) with EF 25-30%. Echo (3/15) with EF 25%, mild to moderately dilated LV, normal RV size and systolic function.   . Pneumonia   . Urethral stricture     Family History  Problem Relation Age of Onset  . Bladder Cancer Mother   . Alcohol abuse Father   . Melanoma Father   . Stroke Maternal Grandmother   . Heart Problems Maternal Grandmother        unknown  . Diabetes Maternal Grandmother   . Heart disease Maternal Grandfather   . Prostate cancer Maternal Grandfather     SOCIAL HISTORY: Social History   Tobacco Use  . Smoking status: Former Smoker    Packs/day: 2.00    Years: 32.00    Pack years: 64.00    Types: Cigarettes    Last attempt to quit: 05/11/2010    Years since quitting: 7.5  . Smokeless tobacco: Never Used  Substance Use Topics  .  Alcohol use: No    Allergies  Allergen Reactions  . Morphine Sulfate Rash and Other (See Comments)    Itches all over, red spots    Current Outpatient Medications  Medication Sig Dispense Refill  . aspirin EC 81 MG tablet Take 81 mg by mouth daily.     [name] atorvastatin (LIPITOR) 80 MG tablet Take 1 tablet (80 mg total) by mouth at bedtime. 90 tablet 3  . azelastine (ASTELIN) 0.1 % nasal spray Place 2 sprays into both nostrils 3 (three) times daily as needed for rhinitis. Use in each nostril as directed 30 mL 12  . carvedilol (COREG) 12.5 MG tablet Take 1 tablet (12.5 mg total) by mouth 2 (two) times daily with a meal. 180 tablet 3  . fenofibrate 160 MG tablet Take 1 tablet (160 mg total) by mouth daily. 90 tablet 3  .  fexofenadine (ALLEGRA) 180 MG tablet Take 180 mg by mouth daily.    . furosemide (LASIX) 40 MG tablet Take 3 tablets (120 mg total) by mouth daily. Take 3 tablets (120 mg) by mouth daily 270 tablet 3  . gabapentin (NEURONTIN) 300 MG capsule Take 1 capsule (300 mg total) by mouth 3 (three) times daily. 270 capsule 3  . glucose blood test strip Check 1 time daily. E11.9 One Touch Ultra Blue Test Strips 100 each 3  . Insulin Glargine (BASAGLAR KWIKPEN) 100 UNIT/ML SOPN Inject 0.44 mLs (44 Units total) into the skin at bedtime. (Patient taking differently: Inject 55 Units into the skin at bedtime. ) 5 pen 3  . insulin lispro (HUMALOG) 100 UNIT/ML injection Inject 0-0.14 mLs (0-14 Units total) into the skin 3 (three) times daily before meals. Dose per sliding scale 3 vial 5  . Insulin Pen Needle (BD PEN NEEDLE NANO U/F) 32G X 4 MM MISC USE ONCE DAILY 90 each 1  . lanthanum (FOSRENOL) 1000 MG chewable tablet Chew 1,000 mg by mouth 3 (three) times daily with meals. Take one tablet after each meal.     . multivitamin (RENA-VIT) TABS tablet Take 1 tablet by mouth daily.  99  . nystatin cream (MYCOSTATIN) Apply 1 application topically 2 (two) times daily. 30 g 2  . Olopatadine HCl (PATADAY) 0.2 % SOLN Place 1 drop into both eyes daily as needed (for allergies).      No current facility-administered medications for this visit.     REVIEW OF SYSTEMS:  _0  denotes positive finding, _1  denotes negative finding Cardiac  Comments:  Chest pain or chest pressure:    Shortness of breath upon exertion:    Short of breath when lying flat:    Irregular heart rhythm:        Vascular    Pain in calf, thigh, or hip brought on by ambulation:    Pain in feet at night that wakes you up from your sleep:     Blood clot in your veins:    Leg swelling:         Pulmonary    Oxygen at home:    Productive cough:     Wheezing:         Neurologic    Sudden weakness in arms or legs:     Sudden numbness in arms or  legs:     Sudden onset of difficulty speaking or slurred speech:    Temporary loss of vision in one eye:     Problems with dizziness:         Gastrointestinal  Blood in stool:     Vomited blood:         Genitourinary    Burning when urinating:     Blood in urine:        Psychiatric    Major depression:         Hematologic    Bleeding problems:    Problems with blood clotting too easily:        Skin    Rashes or ulcers:        Constitutional    Fever or chills:     PHYSICAL EXAM:   Vitals:   11/29/17 0936  BP: (!) 184/80  Pulse: 74  Resp: 16  Temp: (!) 97 F (36.1 C)  TempSrc: Oral  SpO2: 99%  Weight: 204 lb (92.5 kg)  Height: _0  (1.778 m)    GENERAL: The patient is a well-nourished male, in no acute distress. The vital signs are documented above. CARDIAC: There is a regular rate and rhythm.  VASCULAR: He has soft bilateral carotid bruits. He has a weak thrill in his left upper arm fistula. PULMONARY: There is good air exchange bilaterally without wheezing or rales. ABDOMEN: Soft and non-tender with normal pitched bowel sounds.  MUSCULOSKELETAL: He has a right below the knee amputation. NEUROLOGIC: No focal weakness or paresthesias are detected. SKIN: There are no ulcers or rashes noted. PSYCHIATRIC: The patient has a normal affect.  DATA:    DUPLEX LEFT BRACHIOCEPHALIC AV FISTULA: I have independently interpreted his duplex of the AV fistula.  There are multiple areas of the stenosis within the left upper arm fistula.  There are at least 3 areas of significant stenosis.  CAROTID DUPLEX: I have independently interpreted his carotid duplex scan.  On the right side there is a less than 39% carotid stenosis.  The vertebral artery is patent with antegrade flow.  On the left side there is a little less than 39% internal carotid artery stenosis and a less than 50% common carotid artery stenosis.  The vertebral artery on the left has bidirectional  flow.  MEDICAL ISSUES:   END-STAGE RENAL DISEASE: I think before considering placing new access in the left arm we should try to salvage the left upper arm fistula if at all possible.  For this reason I recommended that we proceed with a fistulogram.  If we see that the areas of stenosis identified on the duplex are amenable to venoplasty this could potentially be addressed at the same time.  I have discussed the indications for the procedure and the potential complications and he is agreeable to proceed.  The first time I have it is December 13.  Offered to have one my partners do this sooner but he would prefer to wait until then.  I will make further recommendations pending his results.  MILD BILATERAL CAROTID DISEASE: The patient has mild bilateral carotid disease.  He is asymptomatic.  He is on aspirin and is on a statin.  I have ordered a follow-up carotid duplex scan in 1 year and I will see him back at that time.  He is to call sooner if he has problems.  [name] [facility] [phone]

## 2017-11-30 DIAGNOSIS — D631 Anemia in chronic kidney disease: Secondary | ICD-10-CM | POA: Diagnosis not present

## 2017-11-30 DIAGNOSIS — Z992 Dependence on renal dialysis: Secondary | ICD-10-CM | POA: Diagnosis not present

## 2017-11-30 DIAGNOSIS — D509 Iron deficiency anemia, unspecified: Secondary | ICD-10-CM | POA: Diagnosis not present

## 2017-11-30 DIAGNOSIS — N2581 Secondary hyperparathyroidism of renal origin: Secondary | ICD-10-CM | POA: Diagnosis not present

## 2017-11-30 DIAGNOSIS — N186 End stage renal disease: Secondary | ICD-10-CM | POA: Diagnosis not present

## 2017-12-02 DIAGNOSIS — D631 Anemia in chronic kidney disease: Secondary | ICD-10-CM | POA: Diagnosis not present

## 2017-12-02 DIAGNOSIS — Z992 Dependence on renal dialysis: Secondary | ICD-10-CM | POA: Diagnosis not present

## 2017-12-02 DIAGNOSIS — D509 Iron deficiency anemia, unspecified: Secondary | ICD-10-CM | POA: Diagnosis not present

## 2017-12-02 DIAGNOSIS — N186 End stage renal disease: Secondary | ICD-10-CM | POA: Diagnosis not present

## 2017-12-02 DIAGNOSIS — N2581 Secondary hyperparathyroidism of renal origin: Secondary | ICD-10-CM | POA: Diagnosis not present

## 2017-12-05 ENCOUNTER — Other Ambulatory Visit: Payer: Self-pay | Admitting: Family Medicine

## 2017-12-05 DIAGNOSIS — D509 Iron deficiency anemia, unspecified: Secondary | ICD-10-CM | POA: Diagnosis not present

## 2017-12-05 DIAGNOSIS — D631 Anemia in chronic kidney disease: Secondary | ICD-10-CM | POA: Diagnosis not present

## 2017-12-05 DIAGNOSIS — Z992 Dependence on renal dialysis: Secondary | ICD-10-CM | POA: Diagnosis not present

## 2017-12-05 DIAGNOSIS — N2581 Secondary hyperparathyroidism of renal origin: Secondary | ICD-10-CM | POA: Diagnosis not present

## 2017-12-05 DIAGNOSIS — N186 End stage renal disease: Secondary | ICD-10-CM | POA: Diagnosis not present

## 2017-12-07 DIAGNOSIS — D631 Anemia in chronic kidney disease: Secondary | ICD-10-CM | POA: Diagnosis not present

## 2017-12-07 DIAGNOSIS — Z992 Dependence on renal dialysis: Secondary | ICD-10-CM | POA: Diagnosis not present

## 2017-12-07 DIAGNOSIS — N186 End stage renal disease: Secondary | ICD-10-CM | POA: Diagnosis not present

## 2017-12-07 DIAGNOSIS — D509 Iron deficiency anemia, unspecified: Secondary | ICD-10-CM | POA: Diagnosis not present

## 2017-12-07 DIAGNOSIS — N2581 Secondary hyperparathyroidism of renal origin: Secondary | ICD-10-CM | POA: Diagnosis not present

## 2017-12-09 DIAGNOSIS — N186 End stage renal disease: Secondary | ICD-10-CM | POA: Diagnosis not present

## 2017-12-09 DIAGNOSIS — D509 Iron deficiency anemia, unspecified: Secondary | ICD-10-CM | POA: Diagnosis not present

## 2017-12-09 DIAGNOSIS — N2581 Secondary hyperparathyroidism of renal origin: Secondary | ICD-10-CM | POA: Diagnosis not present

## 2017-12-09 DIAGNOSIS — Z992 Dependence on renal dialysis: Secondary | ICD-10-CM | POA: Diagnosis not present

## 2017-12-09 DIAGNOSIS — D631 Anemia in chronic kidney disease: Secondary | ICD-10-CM | POA: Diagnosis not present

## 2017-12-12 DIAGNOSIS — Z992 Dependence on renal dialysis: Secondary | ICD-10-CM | POA: Diagnosis not present

## 2017-12-12 DIAGNOSIS — D631 Anemia in chronic kidney disease: Secondary | ICD-10-CM | POA: Diagnosis not present

## 2017-12-12 DIAGNOSIS — D509 Iron deficiency anemia, unspecified: Secondary | ICD-10-CM | POA: Diagnosis not present

## 2017-12-12 DIAGNOSIS — N2581 Secondary hyperparathyroidism of renal origin: Secondary | ICD-10-CM | POA: Diagnosis not present

## 2017-12-12 DIAGNOSIS — N186 End stage renal disease: Secondary | ICD-10-CM | POA: Diagnosis not present

## 2017-12-14 DIAGNOSIS — N2581 Secondary hyperparathyroidism of renal origin: Secondary | ICD-10-CM | POA: Diagnosis not present

## 2017-12-14 DIAGNOSIS — N186 End stage renal disease: Secondary | ICD-10-CM | POA: Diagnosis not present

## 2017-12-14 DIAGNOSIS — D509 Iron deficiency anemia, unspecified: Secondary | ICD-10-CM | POA: Diagnosis not present

## 2017-12-14 DIAGNOSIS — Z992 Dependence on renal dialysis: Secondary | ICD-10-CM | POA: Diagnosis not present

## 2017-12-14 DIAGNOSIS — D631 Anemia in chronic kidney disease: Secondary | ICD-10-CM | POA: Diagnosis not present

## 2017-12-16 DIAGNOSIS — N186 End stage renal disease: Secondary | ICD-10-CM | POA: Diagnosis not present

## 2017-12-16 DIAGNOSIS — D509 Iron deficiency anemia, unspecified: Secondary | ICD-10-CM | POA: Diagnosis not present

## 2017-12-16 DIAGNOSIS — N2581 Secondary hyperparathyroidism of renal origin: Secondary | ICD-10-CM | POA: Diagnosis not present

## 2017-12-16 DIAGNOSIS — Z992 Dependence on renal dialysis: Secondary | ICD-10-CM | POA: Diagnosis not present

## 2017-12-16 DIAGNOSIS — D631 Anemia in chronic kidney disease: Secondary | ICD-10-CM | POA: Diagnosis not present

## 2017-12-18 ENCOUNTER — Telehealth: Payer: Self-pay

## 2017-12-18 ENCOUNTER — Other Ambulatory Visit: Payer: Self-pay

## 2017-12-18 NOTE — Telephone Encounter (Signed)
Spoke with patient who called to cancel his procedure on for this Friday. He does not want to reschedule at this time. I spoke with Adonis Brook at [facility] who will be seeing him tomorrow for dialysis. She will speak to patient and follow up with [name] tomorrow.

## 2017-12-19 ENCOUNTER — Telehealth: Payer: Self-pay | Admitting: *Deleted

## 2017-12-19 DIAGNOSIS — Z992 Dependence on renal dialysis: Secondary | ICD-10-CM | POA: Diagnosis not present

## 2017-12-19 DIAGNOSIS — D631 Anemia in chronic kidney disease: Secondary | ICD-10-CM | POA: Diagnosis not present

## 2017-12-19 DIAGNOSIS — N2581 Secondary hyperparathyroidism of renal origin: Secondary | ICD-10-CM | POA: Diagnosis not present

## 2017-12-19 DIAGNOSIS — D509 Iron deficiency anemia, unspecified: Secondary | ICD-10-CM | POA: Diagnosis not present

## 2017-12-19 DIAGNOSIS — N186 End stage renal disease: Secondary | ICD-10-CM | POA: Diagnosis not present

## 2017-12-19 NOTE — Telephone Encounter (Signed)
Cancelled Fistulogram as per patient's request.

## 2017-12-19 NOTE — Telephone Encounter (Signed)
Spoke to [employer] at [employer] [phone]) and she said to cancel surgery at this time. The patient will let them know when and if he wants to proceed with fistulogram. The [facility] nurse will then call me to reschedule. I will put his surgery sheet in Dr. Nicole Cella pending file.

## 2017-12-21 DIAGNOSIS — N186 End stage renal disease: Secondary | ICD-10-CM | POA: Diagnosis not present

## 2017-12-21 DIAGNOSIS — D509 Iron deficiency anemia, unspecified: Secondary | ICD-10-CM | POA: Diagnosis not present

## 2017-12-21 DIAGNOSIS — D631 Anemia in chronic kidney disease: Secondary | ICD-10-CM | POA: Diagnosis not present

## 2017-12-21 DIAGNOSIS — N2581 Secondary hyperparathyroidism of renal origin: Secondary | ICD-10-CM | POA: Diagnosis not present

## 2017-12-21 DIAGNOSIS — Z992 Dependence on renal dialysis: Secondary | ICD-10-CM | POA: Diagnosis not present

## 2017-12-22 ENCOUNTER — Encounter (HOSPITAL_COMMUNITY): Admission: RE | Payer: Self-pay | Source: Home / Self Care

## 2017-12-22 ENCOUNTER — Ambulatory Visit (HOSPITAL_COMMUNITY): Admission: RE | Admit: 2017-12-22 | Payer: Medicare Other | Source: Home / Self Care | Admitting: Vascular Surgery

## 2017-12-22 SURGERY — A/V FISTULAGRAM
Anesthesia: LOCAL

## 2017-12-23 DIAGNOSIS — N2581 Secondary hyperparathyroidism of renal origin: Secondary | ICD-10-CM | POA: Diagnosis not present

## 2017-12-23 DIAGNOSIS — D631 Anemia in chronic kidney disease: Secondary | ICD-10-CM | POA: Diagnosis not present

## 2017-12-23 DIAGNOSIS — D509 Iron deficiency anemia, unspecified: Secondary | ICD-10-CM | POA: Diagnosis not present

## 2017-12-23 DIAGNOSIS — Z992 Dependence on renal dialysis: Secondary | ICD-10-CM | POA: Diagnosis not present

## 2017-12-23 DIAGNOSIS — N186 End stage renal disease: Secondary | ICD-10-CM | POA: Diagnosis not present

## 2017-12-26 DIAGNOSIS — D509 Iron deficiency anemia, unspecified: Secondary | ICD-10-CM | POA: Diagnosis not present

## 2017-12-26 DIAGNOSIS — Z992 Dependence on renal dialysis: Secondary | ICD-10-CM | POA: Diagnosis not present

## 2017-12-26 DIAGNOSIS — D631 Anemia in chronic kidney disease: Secondary | ICD-10-CM | POA: Diagnosis not present

## 2017-12-26 DIAGNOSIS — N186 End stage renal disease: Secondary | ICD-10-CM | POA: Diagnosis not present

## 2017-12-26 DIAGNOSIS — N2581 Secondary hyperparathyroidism of renal origin: Secondary | ICD-10-CM | POA: Diagnosis not present

## 2017-12-27 ENCOUNTER — Encounter: Payer: Self-pay | Admitting: Internal Medicine

## 2017-12-27 ENCOUNTER — Ambulatory Visit (INDEPENDENT_AMBULATORY_CARE_PROVIDER_SITE_OTHER): Payer: Medicare Other | Admitting: Internal Medicine

## 2017-12-27 VITALS — BP 152/82 | HR 78 | Ht 70.0 in | Wt 219.2 lb

## 2017-12-27 DIAGNOSIS — N186 End stage renal disease: Secondary | ICD-10-CM | POA: Diagnosis not present

## 2017-12-27 DIAGNOSIS — I428 Other cardiomyopathies: Secondary | ICD-10-CM | POA: Diagnosis not present

## 2017-12-27 DIAGNOSIS — Z4502 Encounter for adjustment and management of automatic implantable cardiac defibrillator: Secondary | ICD-10-CM | POA: Diagnosis not present

## 2017-12-27 DIAGNOSIS — I6523 Occlusion and stenosis of bilateral carotid arteries: Secondary | ICD-10-CM | POA: Diagnosis not present

## 2017-12-27 DIAGNOSIS — Z992 Dependence on renal dialysis: Secondary | ICD-10-CM | POA: Diagnosis not present

## 2017-12-27 NOTE — Progress Notes (Signed)
Patient Care Team: [name], MD as PCP - General (Family Medicine) Harl Bowie Alphonse Guild, MD as PCP - Cardiology (Cardiology) Deboraha Sprang, MD as Consulting Physician (Cardiology) Fran Lowes, MD as Consulting Physician (Nephrology) Harl Bowie Alphonse Guild, MD as Consulting Physician (Cardiology) [place], [facility], Revonda Standard, MD as Consulting Physician (Cardiology)   HPI  Anthony Bullock is a 57 y.o. male Seen in follow-up for ICD implantation-subcutaneous. (6/15) He is here today because he was called from our office saying that his ICD had fired.  He has a history of nonischemic cardiomyopathy and now ESRD on HD-TTS    Per Vas disease with R LExt revascularization 2018 for critical limb ischemia>> R BKA tolerating well   The patient denies chest pain, shortness of breath, nocturnal dyspnea, orthopnea or peripheral edema.  There have been no palpitations syncope.  Some post HD [facility]         Past Medical History:  Diagnosis Date  . 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 Allergic rhinitis   . Anemia   . Arthritis   . Chronic systolic heart failure ([place])    a. ECHO (12/2012) EF 25-30%, HK entireanteroseptal myocardium //  b.  EF 25%, diffuse HK, grade 1 diastolic dysfunction, MAC, mild LAE, normal RVSF, trivial pericardial effusion  . COPD (chronic obstructive pulmonary disease) ([place])   . Diabetes mellitus type II   . Diabetic nephropathy ([place])   . Diabetic neuropathy ([place])   . ESRD on hemodialysis [facility])    started HD June 2017, goes to [facility] HD unit, Dr Hinda Lenis  . History of cardiac catheterization    a.Myoview 1/15:  There is significant left ventricular dysfunction. There may be slight scar at the apex. There is no significant ischemia. LV Ejection Fraction: 27%  //  b. RHC/LHC (1/15) with mean RA 6, PA 47/22 mean 33, mean PCWP 20, PVR 2.5 WU, CI 2.5; 80% dLAD stenosis, 70% diffuse  large D.   . History of kidney stones   . Hyperlipidemia   . Hypertension   . Kidney stones   . NICM (nonischemic cardiomyopathy) ([place])    Primarily nonischemic. Echo (12/14) with EF 25-30%. Echo (3/15) with EF 25%, mild to moderately dilated LV, normal RV size and systolic function.   . Pneumonia   . Urethral stricture     Past Surgical History:  Procedure Laterality Date  . ABDOMINAL AORTOGRAM W/LOWER EXTREMITY N/A 03/30/2016   Procedure: Abdominal Aortogram w/Lower Extremity;  Surgeon: Angelia Mould, MD;  Location: [place] CV LAB;  Service: Cardiovascular;  Laterality: N/A;  . AMPUTATION Right 04/26/2016   Procedure: Right Below Knee Amputation;  Surgeon: Newt Minion, MD;  Location: [place];  Service: Orthopedics;  Laterality: Right;  . AV FISTULA PLACEMENT Right 09/08/2015   Procedure: INSERTION OF 4-74m x 45cm  ARTERIOVENOUS (AV) GORE-TEX GRAFT RIGHT UPPER  ARM;  Surgeon: CAngelia Mould MD;  [place]  Service: Vascular;  Laterality: Right;  . AV FISTULA PLACEMENT Left 01/14/2016   Procedure: CREATION OF LEFT UPPER ARM ARTERIOVENOUS FISTULA;  Surgeon: CAngelia Mould MD;  [place]  Service: Vascular;  Laterality: Left;  . BASCILIC VEIN TRANSPOSITION Right 08/22/2014   Procedure: RIGHT UPPER ARM BASCILIC VEIN TRANSPOSITION;  Surgeon: CAngelia Mould MD;  [place]  Service: Vascular;  Laterality: Right;  . BELOW KNEE LEG AMPUTATION Right 04/26/2016  . CARDIAC CATHETERIZATION    .  CARDIAC DEFIBRILLATOR PLACEMENT  06/27/2013   Sub Q       BY DR Caryl Comes  . COLONOSCOPY WITH PROPOFOL N/A 07/22/2015   Procedure: COLONOSCOPY WITH PROPOFOL;  Surgeon: Doran Stabler, MD;  [facility];  Service: Gastroenterology;  Laterality: N/A;  . FEMORAL-POPLITEAL BYPASS GRAFT Right 03/31/2016   Procedure: BYPASS GRAFT FEMORAL-POPLITEAL ARTERY USING RIGHT GREATER SAPHENOUS NONREVERSED VEIN;  Surgeon: Angelia Mould, MD;  Location: [place];  Service: Vascular;  Laterality: Right;  . HERNIA REPAIR    . I&D EXTREMITY Right 03/31/2016   Procedure: IRRIGATION AND DEBRIDEMENT FOOT;  Surgeon: Angelia Mould, MD;  Location: [place];  Service: Vascular;  Laterality: Right;  . IMPLANTABLE CARDIOVERTER DEFIBRILLATOR IMPLANT N/A 06/27/2013   Procedure: SUB Q ICD;  Surgeon: Deboraha Sprang, MD;  Location: [facility] CATH LAB;  Service: Cardiovascular;  Laterality: N/A;  . INTRAOPERATIVE ARTERIOGRAM Right 03/31/2016   Procedure: INTRA OPERATIVE ARTERIOGRAM;  Surgeon: Angelia Mould, MD;  Location: [place];  Service: Vascular;  Laterality: Right;  . IR GENERIC HISTORICAL Right 11/30/2015   IR THROMBECTOMY AV FISTULA W/THROMBOLYSIS/PTA INC/SHUNT/IMG RIGHT 11/30/2015 Aletta Edouard, MD [facility]  . IR GENERIC HISTORICAL  11/30/2015   IR US GUIDE VASC ACCESS RIGHT 11/30/2015 Aletta Edouard, MD [facility]  . IR GENERIC HISTORICAL Right 12/15/2015   IR THROMBECTOMY AV FISTULA W/THROMBOLYSIS/PTA/STENT INC/SHUNT/IMG RT 12/15/2015 Arne Cleveland, MD [facility]  . IR GENERIC HISTORICAL  12/15/2015   IR US GUIDE VASC ACCESS RIGHT 12/15/2015 Arne Cleveland, MD [facility]  . IR GENERIC HISTORICAL  12/28/2015   IR FLUORO GUIDE CV LINE RIGHT 12/28/2015 Marybelle Killings, MD [facility]  . IR GENERIC HISTORICAL  12/28/2015   IR US GUIDE VASC ACCESS RIGHT 12/28/2015 Marybelle Killings, MD [facility]  . LEFT A ND RIGHT HEART CATH  01/30/2013   DR Sung Amabile  . LEFT AND RIGHT HEART CATHETERIZATION WITH CORONARY ANGIOGRAM N/A 01/30/2013   Procedure: LEFT AND RIGHT HEART CATHETERIZATION WITH CORONARY ANGIOGRAM;  Surgeon: Jolaine Artist, MD;  Location: [facility] C[facility] LAB;  Service: Cardiovascular;  Laterality: N/A;  . PERIPHERAL VASCULAR CATHETERIZATION Right 01/26/2015   Procedure: A/V Fistulagram;  Surgeon: Angelia Mould, MD;  Location: [place] CV LAB;  Service: Cardiovascular;  Laterality: Right;  . reapea urethral surgery for recurrent obstruction   2011  . TOTAL KNEE ARTHROPLASTY Right 2007  . VEIN HARVEST Right 03/31/2016   Procedure: RIGHT GREATER SAPHENOUS VEIN HARVEST;  Surgeon: Angelia Mould, MD;  Location: [facility] OR;  Service: Vascular;  Laterality: Right;    Current Outpatient Medications  Medication Sig Dispense Refill  . aspirin EC 81 MG tablet Take 81 mg by mouth daily.     [name] atorvastatin (LIPITOR) 80 MG tablet Take 1 tablet (80 mg total) by mouth at bedtime. 90 tablet 3  . azelastine (ASTELIN) 0.1 % nasal spray Place 2 sprays into both nostrils 3 (three) times daily as needed for rhinitis. Use in each nostril as directed 30 mL 12  . carvedilol (COREG) 12.5 MG tablet Take 1 tablet (12.5 mg total) by mouth 2 (two) times daily with a meal. 180 tablet 3  . fenofibrate 160 MG tablet Take 1 tablet (160 mg total) by mouth daily. 90 tablet 3  . fexofenadine (ALLEGRA) 180 MG tablet Take 180 mg by mouth daily.    . furosemide (LASIX) 40 MG tablet Take 3 tablets (120 mg total) by mouth daily. Take 3 tablets (120 mg) by mouth daily 270  tablet 3  . gabapentin (NEURONTIN) 300 MG capsule Take 1 capsule (300 mg total) by mouth 3 (three) times daily. 270 capsule 3  . glucose blood test strip Check 1 time daily. E11.9 One Touch Ultra Blue Test Strips 100 each 3  . Insulin Glargine (BASAGLAR KWIKPEN) 100 UNIT/ML SOPN Inject 0.55 mLs (55 Units total) into the skin at bedtime. 15 pen 3  . insulin lispro (HUMALOG) 100 UNIT/ML injection Inject 0-0.14 mLs (0-14 Units total) into the skin 3 (three) times daily before meals. Dose per sliding scale 3 vial 5  . Insulin Pen Needle (BD PEN NEEDLE NANO U/F) 32G X 4 MM MISC USE ONCE DAILY 90 each 1  . lanthanum (FOSRENOL) 1000 MG chewable tablet Chew 1,000 mg by mouth 3 (three) times daily with meals. Take one tablet after each meal.     . multivitamin (RENA-VIT) TABS tablet Take 1 tablet by mouth daily.  99  . nystatin cream (MYCOSTATIN) Apply 1 application topically 2 (two) times daily. 30 g 2  .  Olopatadine HCl (PATADAY) 0.2 % SOLN Place 1 drop into both eyes daily as needed (for allergies).      No current facility-administered medications for this visit.     Allergies  Allergen Reactions  . Morphine Sulfate Rash and Other (See Comments)    Itches all over, red spots    Review of Systems negative except from HPI and PMH  Physical Exam BP (!) 152/82   Pulse 78   Ht 5' 10"  (1.778 m)   Wt 219 lb 3.2 oz (99.4 kg)   SpO2 97%   BMI 31.45 kg/m  Well developed and nourished in no acute distress HENT normal Neck supple with JVP-flat Clear Device pocket well healed; without hematoma or erythema.  There is no tethering  Regular rate and rhythm, no murmurs or gallops Abd-soft with active BS No Clubbing cyanosis edema Skin-warm and dry A & Oriented  Grossly normal sensory and motor function   ECG sinus @ 78 17/10/40  Assessment and  Plan  ICD-subcutaneous The patient's device was interrogated.  The information was reviewed. No changes were made in the programming.     Nonischemic cardiomyopathy  HFrEF  Renal failure  HD  Euvolemic continue current meds  Tolerating post dialysis LH  Could use proamatine

## 2017-12-27 NOTE — Patient Instructions (Signed)
Medication Instructions:  Your physician recommends that you continue on your current medications as directed. Please refer to the Current Medication list given to you today.  Labwork: None ordered.  Testing/Procedures: None ordered.  Follow-Up: Your physician recommends that you schedule a follow-up appointment in:   3 months with our [facility]  12 months with Dr Caryl Comes   Any Other Special Instructions Will Be Listed Below (If Applicable).     If you need a refill on your cardiac medications before your next appointment, please call your pharmacy.

## 2017-12-28 DIAGNOSIS — N2581 Secondary hyperparathyroidism of renal origin: Secondary | ICD-10-CM | POA: Diagnosis not present

## 2017-12-28 DIAGNOSIS — D509 Iron deficiency anemia, unspecified: Secondary | ICD-10-CM | POA: Diagnosis not present

## 2017-12-28 DIAGNOSIS — D631 Anemia in chronic kidney disease: Secondary | ICD-10-CM | POA: Diagnosis not present

## 2017-12-28 DIAGNOSIS — N186 End stage renal disease: Secondary | ICD-10-CM | POA: Diagnosis not present

## 2017-12-28 DIAGNOSIS — Z992 Dependence on renal dialysis: Secondary | ICD-10-CM | POA: Diagnosis not present

## 2017-12-29 LAB — CUP PACEART INCLINIC DEVICE CHECK
Date Time Interrogation Session: 20191220114542
Implantable Pulse Generator Implant Date: 20150618
Pulse Gen Model: 1010
Pulse Gen Serial Number: 111255

## 2017-12-30 DIAGNOSIS — D631 Anemia in chronic kidney disease: Secondary | ICD-10-CM | POA: Diagnosis not present

## 2017-12-30 DIAGNOSIS — D509 Iron deficiency anemia, unspecified: Secondary | ICD-10-CM | POA: Diagnosis not present

## 2017-12-30 DIAGNOSIS — N186 End stage renal disease: Secondary | ICD-10-CM | POA: Diagnosis not present

## 2017-12-30 DIAGNOSIS — N2581 Secondary hyperparathyroidism of renal origin: Secondary | ICD-10-CM | POA: Diagnosis not present

## 2017-12-30 DIAGNOSIS — Z992 Dependence on renal dialysis: Secondary | ICD-10-CM | POA: Diagnosis not present

## 2018-01-01 DIAGNOSIS — Z992 Dependence on renal dialysis: Secondary | ICD-10-CM | POA: Diagnosis not present

## 2018-01-01 DIAGNOSIS — D509 Iron deficiency anemia, unspecified: Secondary | ICD-10-CM | POA: Diagnosis not present

## 2018-01-01 DIAGNOSIS — D631 Anemia in chronic kidney disease: Secondary | ICD-10-CM | POA: Diagnosis not present

## 2018-01-01 DIAGNOSIS — N186 End stage renal disease: Secondary | ICD-10-CM | POA: Diagnosis not present

## 2018-01-01 DIAGNOSIS — N2581 Secondary hyperparathyroidism of renal origin: Secondary | ICD-10-CM | POA: Diagnosis not present

## 2018-01-04 DIAGNOSIS — D631 Anemia in chronic kidney disease: Secondary | ICD-10-CM | POA: Diagnosis not present

## 2018-01-04 DIAGNOSIS — Z794 Long term (current) use of insulin: Secondary | ICD-10-CM | POA: Diagnosis not present

## 2018-01-04 DIAGNOSIS — Z992 Dependence on renal dialysis: Secondary | ICD-10-CM | POA: Diagnosis not present

## 2018-01-04 DIAGNOSIS — E119 Type 2 diabetes mellitus without complications: Secondary | ICD-10-CM | POA: Diagnosis not present

## 2018-01-04 DIAGNOSIS — D509 Iron deficiency anemia, unspecified: Secondary | ICD-10-CM | POA: Diagnosis not present

## 2018-01-04 DIAGNOSIS — N186 End stage renal disease: Secondary | ICD-10-CM | POA: Diagnosis not present

## 2018-01-04 DIAGNOSIS — N2581 Secondary hyperparathyroidism of renal origin: Secondary | ICD-10-CM | POA: Diagnosis not present

## 2018-01-06 DIAGNOSIS — Z992 Dependence on renal dialysis: Secondary | ICD-10-CM | POA: Diagnosis not present

## 2018-01-06 DIAGNOSIS — N186 End stage renal disease: Secondary | ICD-10-CM | POA: Diagnosis not present

## 2018-01-06 DIAGNOSIS — D509 Iron deficiency anemia, unspecified: Secondary | ICD-10-CM | POA: Diagnosis not present

## 2018-01-06 DIAGNOSIS — N2581 Secondary hyperparathyroidism of renal origin: Secondary | ICD-10-CM | POA: Diagnosis not present

## 2018-01-06 DIAGNOSIS — D631 Anemia in chronic kidney disease: Secondary | ICD-10-CM | POA: Diagnosis not present

## 2018-01-09 DIAGNOSIS — D509 Iron deficiency anemia, unspecified: Secondary | ICD-10-CM | POA: Diagnosis not present

## 2018-01-09 DIAGNOSIS — D631 Anemia in chronic kidney disease: Secondary | ICD-10-CM | POA: Diagnosis not present

## 2018-01-09 DIAGNOSIS — Z992 Dependence on renal dialysis: Secondary | ICD-10-CM | POA: Diagnosis not present

## 2018-01-09 DIAGNOSIS — N2581 Secondary hyperparathyroidism of renal origin: Secondary | ICD-10-CM | POA: Diagnosis not present

## 2018-01-09 DIAGNOSIS — N186 End stage renal disease: Secondary | ICD-10-CM | POA: Diagnosis not present

## 2018-01-11 DIAGNOSIS — N2581 Secondary hyperparathyroidism of renal origin: Secondary | ICD-10-CM | POA: Diagnosis not present

## 2018-01-11 DIAGNOSIS — D509 Iron deficiency anemia, unspecified: Secondary | ICD-10-CM | POA: Diagnosis not present

## 2018-01-11 DIAGNOSIS — D631 Anemia in chronic kidney disease: Secondary | ICD-10-CM | POA: Diagnosis not present

## 2018-01-11 DIAGNOSIS — N186 End stage renal disease: Secondary | ICD-10-CM | POA: Diagnosis not present

## 2018-01-11 DIAGNOSIS — Z992 Dependence on renal dialysis: Secondary | ICD-10-CM | POA: Diagnosis not present

## 2018-01-13 DIAGNOSIS — D509 Iron deficiency anemia, unspecified: Secondary | ICD-10-CM | POA: Diagnosis not present

## 2018-01-13 DIAGNOSIS — Z992 Dependence on renal dialysis: Secondary | ICD-10-CM | POA: Diagnosis not present

## 2018-01-13 DIAGNOSIS — N2581 Secondary hyperparathyroidism of renal origin: Secondary | ICD-10-CM | POA: Diagnosis not present

## 2018-01-13 DIAGNOSIS — N186 End stage renal disease: Secondary | ICD-10-CM | POA: Diagnosis not present

## 2018-01-13 DIAGNOSIS — D631 Anemia in chronic kidney disease: Secondary | ICD-10-CM | POA: Diagnosis not present

## 2018-01-16 DIAGNOSIS — N2581 Secondary hyperparathyroidism of renal origin: Secondary | ICD-10-CM | POA: Diagnosis not present

## 2018-01-16 DIAGNOSIS — N186 End stage renal disease: Secondary | ICD-10-CM | POA: Diagnosis not present

## 2018-01-16 DIAGNOSIS — D509 Iron deficiency anemia, unspecified: Secondary | ICD-10-CM | POA: Diagnosis not present

## 2018-01-16 DIAGNOSIS — D631 Anemia in chronic kidney disease: Secondary | ICD-10-CM | POA: Diagnosis not present

## 2018-01-16 DIAGNOSIS — Z992 Dependence on renal dialysis: Secondary | ICD-10-CM | POA: Diagnosis not present

## 2018-01-18 DIAGNOSIS — N186 End stage renal disease: Secondary | ICD-10-CM | POA: Diagnosis not present

## 2018-01-18 DIAGNOSIS — N2581 Secondary hyperparathyroidism of renal origin: Secondary | ICD-10-CM | POA: Diagnosis not present

## 2018-01-18 DIAGNOSIS — Z992 Dependence on renal dialysis: Secondary | ICD-10-CM | POA: Diagnosis not present

## 2018-01-18 DIAGNOSIS — D631 Anemia in chronic kidney disease: Secondary | ICD-10-CM | POA: Diagnosis not present

## 2018-01-18 DIAGNOSIS — D509 Iron deficiency anemia, unspecified: Secondary | ICD-10-CM | POA: Diagnosis not present

## 2018-01-20 DIAGNOSIS — N2581 Secondary hyperparathyroidism of renal origin: Secondary | ICD-10-CM | POA: Diagnosis not present

## 2018-01-20 DIAGNOSIS — D509 Iron deficiency anemia, unspecified: Secondary | ICD-10-CM | POA: Diagnosis not present

## 2018-01-20 DIAGNOSIS — Z992 Dependence on renal dialysis: Secondary | ICD-10-CM | POA: Diagnosis not present

## 2018-01-20 DIAGNOSIS — N186 End stage renal disease: Secondary | ICD-10-CM | POA: Diagnosis not present

## 2018-01-20 DIAGNOSIS — D631 Anemia in chronic kidney disease: Secondary | ICD-10-CM | POA: Diagnosis not present

## 2018-01-22 DIAGNOSIS — I5023 Acute on chronic systolic (congestive) heart failure: Secondary | ICD-10-CM | POA: Diagnosis not present

## 2018-01-22 DIAGNOSIS — R0603 Acute respiratory distress: Secondary | ICD-10-CM | POA: Diagnosis not present

## 2018-01-22 DIAGNOSIS — E1165 Type 2 diabetes mellitus with hyperglycemia: Secondary | ICD-10-CM | POA: Diagnosis not present

## 2018-01-22 DIAGNOSIS — R0902 Hypoxemia: Secondary | ICD-10-CM | POA: Diagnosis not present

## 2018-01-22 DIAGNOSIS — I132 Hypertensive heart and chronic kidney disease with heart failure and with stage 5 chronic kidney disease, or end stage renal disease: Secondary | ICD-10-CM | POA: Diagnosis not present

## 2018-01-22 DIAGNOSIS — J441 Chronic obstructive pulmonary disease with (acute) exacerbation: Secondary | ICD-10-CM | POA: Diagnosis not present

## 2018-01-22 DIAGNOSIS — I509 Heart failure, unspecified: Secondary | ICD-10-CM | POA: Diagnosis not present

## 2018-01-22 DIAGNOSIS — N186 End stage renal disease: Secondary | ICD-10-CM | POA: Diagnosis not present

## 2018-01-22 DIAGNOSIS — Z5329 Procedure and treatment not carried out because of patient's decision for other reasons: Secondary | ICD-10-CM | POA: Diagnosis not present

## 2018-01-22 DIAGNOSIS — I428 Other cardiomyopathies: Secondary | ICD-10-CM | POA: Diagnosis not present

## 2018-01-22 DIAGNOSIS — R000 Tachycardia, unspecified: Secondary | ICD-10-CM | POA: Diagnosis not present

## 2018-01-22 DIAGNOSIS — J80 Acute respiratory distress syndrome: Secondary | ICD-10-CM | POA: Diagnosis not present

## 2018-01-22 DIAGNOSIS — R0689 Other abnormalities of breathing: Secondary | ICD-10-CM | POA: Diagnosis not present

## 2018-01-23 DIAGNOSIS — Z5329 Procedure and treatment not carried out because of patient's decision for other reasons: Secondary | ICD-10-CM | POA: Diagnosis not present

## 2018-01-23 DIAGNOSIS — I132 Hypertensive heart and chronic kidney disease with heart failure and with stage 5 chronic kidney disease, or end stage renal disease: Secondary | ICD-10-CM | POA: Diagnosis present

## 2018-01-23 DIAGNOSIS — Z794 Long term (current) use of insulin: Secondary | ICD-10-CM | POA: Diagnosis not present

## 2018-01-23 DIAGNOSIS — Z79899 Other long term (current) drug therapy: Secondary | ICD-10-CM | POA: Diagnosis not present

## 2018-01-23 DIAGNOSIS — I428 Other cardiomyopathies: Secondary | ICD-10-CM | POA: Diagnosis present

## 2018-01-23 DIAGNOSIS — Z7982 Long term (current) use of aspirin: Secondary | ICD-10-CM | POA: Diagnosis not present

## 2018-01-23 DIAGNOSIS — E1165 Type 2 diabetes mellitus with hyperglycemia: Secondary | ICD-10-CM | POA: Diagnosis present

## 2018-01-23 DIAGNOSIS — Z992 Dependence on renal dialysis: Secondary | ICD-10-CM | POA: Diagnosis not present

## 2018-01-23 DIAGNOSIS — J441 Chronic obstructive pulmonary disease with (acute) exacerbation: Secondary | ICD-10-CM | POA: Diagnosis present

## 2018-01-23 DIAGNOSIS — D509 Iron deficiency anemia, unspecified: Secondary | ICD-10-CM | POA: Diagnosis not present

## 2018-01-23 DIAGNOSIS — N2581 Secondary hyperparathyroidism of renal origin: Secondary | ICD-10-CM | POA: Diagnosis not present

## 2018-01-23 DIAGNOSIS — E669 Obesity, unspecified: Secondary | ICD-10-CM | POA: Diagnosis present

## 2018-01-23 DIAGNOSIS — E1122 Type 2 diabetes mellitus with diabetic chronic kidney disease: Secondary | ICD-10-CM | POA: Diagnosis present

## 2018-01-23 DIAGNOSIS — Z87891 Personal history of nicotine dependence: Secondary | ICD-10-CM | POA: Diagnosis not present

## 2018-01-23 DIAGNOSIS — E1142 Type 2 diabetes mellitus with diabetic polyneuropathy: Secondary | ICD-10-CM | POA: Diagnosis present

## 2018-01-23 DIAGNOSIS — D631 Anemia in chronic kidney disease: Secondary | ICD-10-CM | POA: Diagnosis not present

## 2018-01-23 DIAGNOSIS — I5023 Acute on chronic systolic (congestive) heart failure: Secondary | ICD-10-CM | POA: Diagnosis present

## 2018-01-23 DIAGNOSIS — Z89511 Acquired absence of right leg below knee: Secondary | ICD-10-CM | POA: Diagnosis not present

## 2018-01-23 DIAGNOSIS — Z95810 Presence of automatic (implantable) cardiac defibrillator: Secondary | ICD-10-CM | POA: Diagnosis not present

## 2018-01-23 DIAGNOSIS — N186 End stage renal disease: Secondary | ICD-10-CM | POA: Diagnosis present

## 2018-01-23 DIAGNOSIS — E7800 Pure hypercholesterolemia, unspecified: Secondary | ICD-10-CM | POA: Diagnosis present

## 2018-01-24 ENCOUNTER — Encounter: Payer: Self-pay | Admitting: Family Medicine

## 2018-01-24 ENCOUNTER — Ambulatory Visit (INDEPENDENT_AMBULATORY_CARE_PROVIDER_SITE_OTHER): Payer: Medicare Other | Admitting: Family Medicine

## 2018-01-24 VITALS — BP 116/74 | HR 90 | Temp 99.1°F | Ht 70.0 in | Wt 213.7 lb

## 2018-01-24 DIAGNOSIS — I5022 Chronic systolic (congestive) heart failure: Secondary | ICD-10-CM

## 2018-01-24 DIAGNOSIS — Z992 Dependence on renal dialysis: Secondary | ICD-10-CM

## 2018-01-24 DIAGNOSIS — E1165 Type 2 diabetes mellitus with hyperglycemia: Secondary | ICD-10-CM

## 2018-01-24 DIAGNOSIS — N186 End stage renal disease: Secondary | ICD-10-CM | POA: Diagnosis not present

## 2018-01-24 DIAGNOSIS — E1129 Type 2 diabetes mellitus with other diabetic kidney complication: Secondary | ICD-10-CM | POA: Diagnosis not present

## 2018-01-24 DIAGNOSIS — J189 Pneumonia, unspecified organism: Secondary | ICD-10-CM | POA: Diagnosis not present

## 2018-01-24 DIAGNOSIS — R062 Wheezing: Secondary | ICD-10-CM | POA: Diagnosis not present

## 2018-01-24 MED ORDER — METHYLPREDNISOLONE ACETATE 80 MG/ML IJ SUSP
80.0000 mg | Freq: Once | INTRAMUSCULAR | Status: AC
  Administered 2018-01-24: 80 mg via INTRAMUSCULAR

## 2018-01-24 MED ORDER — LEVOFLOXACIN 500 MG PO TABS: ORAL_TABLET | ORAL | 0 refills | Status: DC

## 2018-01-24 NOTE — Progress Notes (Signed)
Subjective:     Patient ID: Anthony Bullock, male   DOB: 1960-05-05, 58 y.o.   MRN: [id number]  HPI Patient is seen for follow-up from recent ER visit to [facility] in [place] on Monday.  Unfortunately, we have no records.  He states that he was diagnosed with "pneumonia "and some increased fluid overload.  He has end-stage renal failure on hemodialysis which he gets every Tuesday, Thursday, and Saturday.  Patient states that he was given dose of IV Levaquin in the ER and apparently he left AGAINST MEDICAL ADVICE and was not given any prescriptions.  He states his dry hemodialysis weight is 91.5 (kgs) and his current weight is 97 which is usually his predialysis weight.  He is scheduled for hemodialysis tomorrow.  Patient relates some recent increased cough.  Some mild wheezing.  He reportedly had IV steroids in the ER.  He has not had any chills.  No documented fever at home.  Only minimal left leg edema.  Previous right below-knee amputation.  Denies any nausea or vomiting.  No dyspnea at rest.  Past Medical History:  Diagnosis Date  . 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 Allergic rhinitis   . Anemia   . Arthritis   . Chronic systolic heart failure ([place])    a. ECHO (12/2012) EF 25-30%, HK entireanteroseptal myocardium //  b.  EF 25%, diffuse HK, grade 1 diastolic dysfunction, MAC, mild LAE, normal RVSF, trivial pericardial effusion  . COPD (chronic obstructive pulmonary disease) ([place])   . Diabetes mellitus type II   . Diabetic nephropathy ([place])   . Diabetic neuropathy ([place])   . ESRD on hemodialysis [facility])    started HD June 2017, goes to [facility] HD unit, Dr Hinda Lenis  . History of cardiac catheterization    a.Myoview 1/15:  There is significant left ventricular dysfunction. There may be slight scar at the apex. There is no significant ischemia. LV Ejection Fraction: 27%  //  b. RHC/LHC (1/15) with mean RA 6, PA 47/22 mean 33, mean PCWP 20, PVR  2.5 WU, CI 2.5; 80% dLAD stenosis, 70% diffuse large D.   . History of kidney stones   . Hyperlipidemia   . Hypertension   . Kidney stones   . NICM (nonischemic cardiomyopathy) ([place])    Primarily nonischemic. Echo (12/14) with EF 25-30%. Echo (3/15) with EF 25%, mild to moderately dilated LV, normal RV size and systolic function.   . Pneumonia   . Urethral stricture    Past Surgical History:  Procedure Laterality Date  . ABDOMINAL AORTOGRAM W/LOWER EXTREMITY N/A 03/30/2016   Procedure: Abdominal Aortogram w/Lower Extremity;  Surgeon: Angelia Mould, MD;  Location: [place] CV LAB;  Service: Cardiovascular;  Laterality: N/A;  . AMPUTATION Right 04/26/2016   Procedure: Right Below Knee Amputation;  Surgeon: Newt Minion, MD;  Location: [facility];  Service: Orthopedics;  Laterality: Right;  . AV FISTULA PLACEMENT Right 09/08/2015   Procedure: INSERTION OF 4-1m x 45cm  ARTERIOVENOUS (AV) GORE-TEX GRAFT RIGHT UPPER  ARM;  Surgeon: CAngelia Mould MD;  [place]  Service: Vascular;  Laterality: Right;  . AV FISTULA PLACEMENT Left 01/14/2016   Procedure: CREATION OF LEFT UPPER ARM ARTERIOVENOUS FISTULA;  Surgeon: CAngelia Mould MD;  [place]  Service: Vascular;  Laterality: Left;  . BASCILIC VEIN TRANSPOSITION Right 08/22/2014   Procedure: RIGHT UPPER ARM BASCILIC VEIN TRANSPOSITION;  Surgeon: CAngelia Mould MD;  [facility];  Service: Vascular;  Laterality: Right;  . BELOW KNEE LEG AMPUTATION Right 04/26/2016  . CARDIAC CATHETERIZATION    . CARDIAC DEFIBRILLATOR PLACEMENT  06/27/2013   Sub Q       BY DR Caryl Comes  . COLONOSCOPY WITH PROPOFOL N/A 07/22/2015   Procedure: COLONOSCOPY WITH PROPOFOL;  Surgeon: Doran Stabler, MD;  [facility];  Service: Gastroenterology;  Laterality: N/A;  . FEMORAL-POPLITEAL BYPASS GRAFT Right 03/31/2016   Procedure: BYPASS GRAFT FEMORAL-POPLITEAL ARTERY USING RIGHT GREATER SAPHENOUS NONREVERSED VEIN;   Surgeon: Angelia Mould, MD;  Location: [place];  Service: Vascular;  Laterality: Right;  . HERNIA REPAIR    . I&D EXTREMITY Right 03/31/2016   Procedure: IRRIGATION AND DEBRIDEMENT FOOT;  Surgeon: Angelia Mould, MD;  Location: [place];  Service: Vascular;  Laterality: Right;  . IMPLANTABLE CARDIOVERTER DEFIBRILLATOR IMPLANT N/A 06/27/2013   Procedure: SUB Q ICD;  Surgeon: Deboraha Sprang, MD;  Location: [facility] CATH LAB;  Service: Cardiovascular;  Laterality: N/A;  . INTRAOPERATIVE ARTERIOGRAM Right 03/31/2016   Procedure: INTRA OPERATIVE ARTERIOGRAM;  Surgeon: Angelia Mould, MD;  Location: [place];  Service: Vascular;  Laterality: Right;  . IR GENERIC HISTORICAL Right 11/30/2015   IR THROMBECTOMY AV FISTULA W/THROMBOLYSIS/PTA INC/SHUNT/IMG RIGHT 11/30/2015 Aletta Edouard, MD [facility]  . IR GENERIC HISTORICAL  11/30/2015   IR US GUIDE VASC ACCESS RIGHT 11/30/2015 Aletta Edouard, MD [facility]  . IR GENERIC HISTORICAL Right 12/15/2015   IR THROMBECTOMY AV FISTULA W/THROMBOLYSIS/PTA/STENT INC/SHUNT/IMG RT 12/15/2015 Arne Cleveland, MD [facility]  . IR GENERIC HISTORICAL  12/15/2015   IR US GUIDE VASC ACCESS RIGHT 12/15/2015 Arne Cleveland, MD [facility]  . IR GENERIC HISTORICAL  12/28/2015   IR FLUORO GUIDE CV LINE RIGHT 12/28/2015 Marybelle Killings, MD [facility]  . IR GENERIC HISTORICAL  12/28/2015   IR US GUIDE VASC ACCESS RIGHT 12/28/2015 Marybelle Killings, MD [facility]  . LEFT A ND RIGHT HEART CATH  01/30/2013   DR Sung Amabile  . LEFT AND RIGHT HEART CATHETERIZATION WITH CORONARY ANGIOGRAM N/A 01/30/2013   Procedure: LEFT AND RIGHT HEART CATHETERIZATION WITH CORONARY ANGIOGRAM;  Surgeon: Jolaine Artist, MD;  Location: [facility] CATH LAB;  Service: Cardiovascular;  Laterality: N/A;  . PERIPHERAL VASCULAR CATHETERIZATION Right 01/26/2015   Procedure: A/V Fistulagram;  Surgeon: Angelia Mould, MD;  Location: [place] CV LAB;  Service: Cardiovascular;  Laterality: Right;  .  reapea urethral surgery for recurrent obstruction  2011  . TOTAL KNEE ARTHROPLASTY Right 2007  . VEIN HARVEST Right 03/31/2016   Procedure: RIGHT GREATER SAPHENOUS VEIN HARVEST;  Surgeon: Angelia Mould, MD;  Location: [place];  Service: Vascular;  Laterality: Right;    reports that he quit smoking about 7 years ago. His smoking use included cigarettes. He has a 64.00 pack-year smoking history. He has never used smokeless tobacco. He reports that he does not drink alcohol or use drugs. family history includes Alcohol abuse in his father; Bladder Cancer in his mother; Diabetes in his maternal grandmother; Heart Problems in his maternal grandmother; Heart disease in his maternal grandfather; Melanoma in his father; Prostate cancer in his maternal grandfather; Stroke in his maternal grandmother. Allergies  Allergen Reactions  . Morphine Sulfate Rash and Other (See Comments)    Itches all over, red spots     Review of Systems  Constitutional: Negative for chills and fever.  Respiratory: Positive for cough and wheezing.   Cardiovascular: Positive for chest pain.  Gastrointestinal: Negative  for nausea and vomiting.  Psychiatric/Behavioral: Negative for confusion.       Objective:   Physical Exam Constitutional:      Appearance: Normal appearance.  Neck:     Musculoskeletal: Neck supple.  Cardiovascular:     Rate and Rhythm: Normal rate and regular rhythm.  Pulmonary:     Effort: Pulmonary effort is normal.     Comments: Does have some diffuse expiratory wheezes.  No retractions.  He has some coarse breath sounds in both bases.  Pulse oximetry 92% Neurological:     Mental Status: He is alert.        Assessment:     Patient is seen following recent ER visit where he was diagnosed with reported pneumonia.  No records at this time.  He was given 1 dose of IV Levaquin but left ER AGAINST MEDICAL ADVICE.  End-stage renal disease on hemodialysis.  He is scheduled for hemodialysis  tomorrow.  He does have evidence for some reactive airway changes on exam but is in no respiratory distress.  Difficult to sort out how much of his issues are fluid overload versus possible pneumonia.  He has had very poor compliance with diet recently which could be contributing to some volume overload    Plan:     -Recommend close follow-up with nephrology and he sees them tomorrow for hemodialysis -We recommend going ahead and covering with Levaquin 500 mg every 48 hours and will take after his hemodialysis  -Depo-Medrol 80 mg IM given -Monitor blood sugars more closely.  He apparently is currently out of lancets -Continue albuterol inhaler as needed for wheezing- not to exceed every 4 to 6 hours.  [name] MD [facility]

## 2018-01-24 NOTE — Patient Instructions (Signed)
Take the Levaquin 500 mg every other day- and take AFTER dialysis.  Follow up for any fever or increased shortness of breath.    Use your albuterol every 4 hours as needed.

## 2018-01-25 DIAGNOSIS — D509 Iron deficiency anemia, unspecified: Secondary | ICD-10-CM | POA: Diagnosis not present

## 2018-01-25 DIAGNOSIS — Z992 Dependence on renal dialysis: Secondary | ICD-10-CM | POA: Diagnosis not present

## 2018-01-25 DIAGNOSIS — D631 Anemia in chronic kidney disease: Secondary | ICD-10-CM | POA: Diagnosis not present

## 2018-01-25 DIAGNOSIS — N2581 Secondary hyperparathyroidism of renal origin: Secondary | ICD-10-CM | POA: Diagnosis not present

## 2018-01-25 DIAGNOSIS — N186 End stage renal disease: Secondary | ICD-10-CM | POA: Diagnosis not present

## 2018-01-27 DIAGNOSIS — N186 End stage renal disease: Secondary | ICD-10-CM | POA: Diagnosis not present

## 2018-01-27 DIAGNOSIS — D631 Anemia in chronic kidney disease: Secondary | ICD-10-CM | POA: Diagnosis not present

## 2018-01-27 DIAGNOSIS — N2581 Secondary hyperparathyroidism of renal origin: Secondary | ICD-10-CM | POA: Diagnosis not present

## 2018-01-27 DIAGNOSIS — Z992 Dependence on renal dialysis: Secondary | ICD-10-CM | POA: Diagnosis not present

## 2018-01-27 DIAGNOSIS — D509 Iron deficiency anemia, unspecified: Secondary | ICD-10-CM | POA: Diagnosis not present

## 2018-01-30 DIAGNOSIS — N2581 Secondary hyperparathyroidism of renal origin: Secondary | ICD-10-CM | POA: Diagnosis not present

## 2018-01-30 DIAGNOSIS — D631 Anemia in chronic kidney disease: Secondary | ICD-10-CM | POA: Diagnosis not present

## 2018-01-30 DIAGNOSIS — N186 End stage renal disease: Secondary | ICD-10-CM | POA: Diagnosis not present

## 2018-01-30 DIAGNOSIS — Z992 Dependence on renal dialysis: Secondary | ICD-10-CM | POA: Diagnosis not present

## 2018-01-30 DIAGNOSIS — D509 Iron deficiency anemia, unspecified: Secondary | ICD-10-CM | POA: Diagnosis not present

## 2018-02-01 DIAGNOSIS — N2581 Secondary hyperparathyroidism of renal origin: Secondary | ICD-10-CM | POA: Diagnosis not present

## 2018-02-01 DIAGNOSIS — Z992 Dependence on renal dialysis: Secondary | ICD-10-CM | POA: Diagnosis not present

## 2018-02-01 DIAGNOSIS — D631 Anemia in chronic kidney disease: Secondary | ICD-10-CM | POA: Diagnosis not present

## 2018-02-01 DIAGNOSIS — N186 End stage renal disease: Secondary | ICD-10-CM | POA: Diagnosis not present

## 2018-02-01 DIAGNOSIS — D509 Iron deficiency anemia, unspecified: Secondary | ICD-10-CM | POA: Diagnosis not present

## 2018-02-03 DIAGNOSIS — D509 Iron deficiency anemia, unspecified: Secondary | ICD-10-CM | POA: Diagnosis not present

## 2018-02-03 DIAGNOSIS — N2581 Secondary hyperparathyroidism of renal origin: Secondary | ICD-10-CM | POA: Diagnosis not present

## 2018-02-03 DIAGNOSIS — N186 End stage renal disease: Secondary | ICD-10-CM | POA: Diagnosis not present

## 2018-02-03 DIAGNOSIS — Z992 Dependence on renal dialysis: Secondary | ICD-10-CM | POA: Diagnosis not present

## 2018-02-03 DIAGNOSIS — D631 Anemia in chronic kidney disease: Secondary | ICD-10-CM | POA: Diagnosis not present

## 2018-02-07 DIAGNOSIS — D631 Anemia in chronic kidney disease: Secondary | ICD-10-CM | POA: Diagnosis not present

## 2018-02-07 DIAGNOSIS — N186 End stage renal disease: Secondary | ICD-10-CM | POA: Diagnosis not present

## 2018-02-07 DIAGNOSIS — N2581 Secondary hyperparathyroidism of renal origin: Secondary | ICD-10-CM | POA: Diagnosis not present

## 2018-02-07 DIAGNOSIS — Z992 Dependence on renal dialysis: Secondary | ICD-10-CM | POA: Diagnosis not present

## 2018-02-07 DIAGNOSIS — D509 Iron deficiency anemia, unspecified: Secondary | ICD-10-CM | POA: Diagnosis not present

## 2018-02-08 DIAGNOSIS — D509 Iron deficiency anemia, unspecified: Secondary | ICD-10-CM | POA: Diagnosis not present

## 2018-02-08 DIAGNOSIS — Z992 Dependence on renal dialysis: Secondary | ICD-10-CM | POA: Diagnosis not present

## 2018-02-08 DIAGNOSIS — N2581 Secondary hyperparathyroidism of renal origin: Secondary | ICD-10-CM | POA: Diagnosis not present

## 2018-02-08 DIAGNOSIS — D631 Anemia in chronic kidney disease: Secondary | ICD-10-CM | POA: Diagnosis not present

## 2018-02-08 DIAGNOSIS — N186 End stage renal disease: Secondary | ICD-10-CM | POA: Diagnosis not present

## 2018-02-09 ENCOUNTER — Other Ambulatory Visit: Payer: Self-pay | Admitting: Family Medicine

## 2018-02-09 DIAGNOSIS — Z992 Dependence on renal dialysis: Secondary | ICD-10-CM | POA: Diagnosis not present

## 2018-02-09 DIAGNOSIS — N186 End stage renal disease: Secondary | ICD-10-CM | POA: Diagnosis not present

## 2018-02-10 DIAGNOSIS — Z992 Dependence on renal dialysis: Secondary | ICD-10-CM | POA: Diagnosis not present

## 2018-02-10 DIAGNOSIS — D509 Iron deficiency anemia, unspecified: Secondary | ICD-10-CM | POA: Diagnosis not present

## 2018-02-10 DIAGNOSIS — N186 End stage renal disease: Secondary | ICD-10-CM | POA: Diagnosis not present

## 2018-02-10 DIAGNOSIS — D631 Anemia in chronic kidney disease: Secondary | ICD-10-CM | POA: Diagnosis not present

## 2018-02-10 DIAGNOSIS — N2581 Secondary hyperparathyroidism of renal origin: Secondary | ICD-10-CM | POA: Diagnosis not present

## 2018-02-10 DIAGNOSIS — R070 Pain in throat: Secondary | ICD-10-CM | POA: Diagnosis not present

## 2018-02-10 DIAGNOSIS — R6889 Other general symptoms and signs: Secondary | ICD-10-CM | POA: Diagnosis not present

## 2018-02-10 DIAGNOSIS — Z6829 Body mass index (BMI) 29.0-29.9, adult: Secondary | ICD-10-CM | POA: Diagnosis not present

## 2018-02-13 DIAGNOSIS — D509 Iron deficiency anemia, unspecified: Secondary | ICD-10-CM | POA: Diagnosis not present

## 2018-02-13 DIAGNOSIS — N2581 Secondary hyperparathyroidism of renal origin: Secondary | ICD-10-CM | POA: Diagnosis not present

## 2018-02-13 DIAGNOSIS — Z992 Dependence on renal dialysis: Secondary | ICD-10-CM | POA: Diagnosis not present

## 2018-02-13 DIAGNOSIS — N186 End stage renal disease: Secondary | ICD-10-CM | POA: Diagnosis not present

## 2018-02-13 DIAGNOSIS — D631 Anemia in chronic kidney disease: Secondary | ICD-10-CM | POA: Diagnosis not present

## 2018-02-15 DIAGNOSIS — N186 End stage renal disease: Secondary | ICD-10-CM | POA: Diagnosis not present

## 2018-02-15 DIAGNOSIS — D631 Anemia in chronic kidney disease: Secondary | ICD-10-CM | POA: Diagnosis not present

## 2018-02-15 DIAGNOSIS — N2581 Secondary hyperparathyroidism of renal origin: Secondary | ICD-10-CM | POA: Diagnosis not present

## 2018-02-15 DIAGNOSIS — D509 Iron deficiency anemia, unspecified: Secondary | ICD-10-CM | POA: Diagnosis not present

## 2018-02-15 DIAGNOSIS — Z992 Dependence on renal dialysis: Secondary | ICD-10-CM | POA: Diagnosis not present

## 2018-02-15 LAB — HEMOGLOBIN A1C: Hemoglobin A1C: 11.6

## 2018-02-16 ENCOUNTER — Encounter: Payer: Self-pay | Admitting: Family Medicine

## 2018-02-16 ENCOUNTER — Ambulatory Visit (INDEPENDENT_AMBULATORY_CARE_PROVIDER_SITE_OTHER): Payer: Medicare Other | Admitting: Family Medicine

## 2018-02-16 ENCOUNTER — Other Ambulatory Visit: Payer: Self-pay

## 2018-02-16 VITALS — BP 150/76 | HR 83 | Temp 98.1°F | Ht 70.0 in | Wt 204.4 lb

## 2018-02-16 DIAGNOSIS — E1165 Type 2 diabetes mellitus with hyperglycemia: Secondary | ICD-10-CM | POA: Diagnosis not present

## 2018-02-16 DIAGNOSIS — E785 Hyperlipidemia, unspecified: Secondary | ICD-10-CM | POA: Diagnosis not present

## 2018-02-16 DIAGNOSIS — E1142 Type 2 diabetes mellitus with diabetic polyneuropathy: Secondary | ICD-10-CM | POA: Diagnosis not present

## 2018-02-16 DIAGNOSIS — L853 Xerosis cutis: Secondary | ICD-10-CM

## 2018-02-16 DIAGNOSIS — I10 Essential (primary) hypertension: Secondary | ICD-10-CM

## 2018-02-16 DIAGNOSIS — E1129 Type 2 diabetes mellitus with other diabetic kidney complication: Secondary | ICD-10-CM

## 2018-02-16 DIAGNOSIS — R062 Wheezing: Secondary | ICD-10-CM | POA: Diagnosis not present

## 2018-02-16 MED ORDER — ALBUTEROL SULFATE HFA 108 (90 BASE) MCG/ACT IN AERS: 2.0000 | INHALATION_SPRAY | RESPIRATORY_TRACT | 1 refills | Status: DC | PRN

## 2018-02-16 MED ORDER — FENOFIBRATE 160 MG PO TABS: 160.0000 mg | ORAL_TABLET | Freq: Every day | ORAL | 3 refills | Status: DC

## 2018-02-16 MED ORDER — ATORVASTATIN CALCIUM 80 MG PO TABS: 80.0000 mg | ORAL_TABLET | Freq: Every day | ORAL | 3 refills | Status: DC

## 2018-02-16 MED ORDER — GABAPENTIN 300 MG PO CAPS: 300.0000 mg | ORAL_CAPSULE | Freq: Three times a day (TID) | ORAL | 3 refills | Status: DC

## 2018-02-16 MED ORDER — METHYLPREDNISOLONE ACETATE 80 MG/ML IJ SUSP
80.0000 mg | Freq: Once | INTRAMUSCULAR | Status: AC
  Administered 2018-02-16: 80 mg via INTRAMUSCULAR

## 2018-02-16 MED ORDER — INSULIN LISPRO 100 UNIT/ML ~~LOC~~ SOLN: 0.0000 [IU] | Freq: Three times a day (TID) | SUBCUTANEOUS | 11 refills | Status: DC

## 2018-02-16 MED ORDER — CARVEDILOL 12.5 MG PO TABS: 12.5000 mg | ORAL_TABLET | Freq: Two times a day (BID) | ORAL | 3 refills | Status: DC

## 2018-02-16 NOTE — Progress Notes (Signed)
Subjective:     Patient ID: Anthony Bullock, male   DOB: Oct 14, 1960, 58 y.o.   MRN: [id number]  HPI Patient seen for multiple issues as follows.  He has chronic problems including history of CAD, peripheral vascular disease, poorly controlled diabetes with long history of poor compliance, systolic heart failure, end-stage renal disease on dialysis, hypertension, hyperlipidemia.  Type 2 diabetes.  A1c yesterday at dialysis 11.4.  He is on basaglar currently 70 units daily and is on Humalog but states that about 85% of time is not using any Humalog.  His prescription had run out apparently.  Only one possible hypoglycemic episode about 2 weeks ago.  Never checked his blood sugar then.  He is not checking blood sugars regularly.  Not drinking any regular sodas as he has in the past but is eating a lot of high carb foods such as breads  He has hyperlipidemia.  Is due for follow-up lipids but declines today.  He takes Lipitor and needs refills.  He has history of neuropathy and is requesting refills of gabapentin.  He states he had respiratory illness and a week ago went to urgent care and tested positive for influenza B.  He was treated with Tamiflu.  He is now afebrile.  He has residual cough and some increased wheezing.  No recurrent fevers  Past Medical History:  Diagnosis Date  . 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 Allergic rhinitis   . Anemia   . Arthritis   . Chronic systolic heart failure ([place])    a. ECHO (12/2012) EF 25-30%, HK entireanteroseptal myocardium //  b.  EF 25%, diffuse HK, grade 1 diastolic dysfunction, MAC, mild LAE, normal RVSF, trivial pericardial effusion  . COPD (chronic obstructive pulmonary disease) ([place])   . Diabetes mellitus type II   . Diabetic nephropathy ([place])   . Diabetic neuropathy ([place])   . ESRD on hemodialysis [facility])    started HD June 2017, goes to [facility] HD unit, Dr Hinda Lenis  . History of cardiac catheterization     a.Myoview 1/15:  There is significant left ventricular dysfunction. There may be slight scar at the apex. There is no significant ischemia. LV Ejection Fraction: 27%  //  b. RHC/LHC (1/15) with mean RA 6, PA 47/22 mean 33, mean PCWP 20, PVR 2.5 WU, CI 2.5; 80% dLAD stenosis, 70% diffuse large D.   . History of kidney stones   . Hyperlipidemia   . Hypertension   . Kidney stones   . NICM (nonischemic cardiomyopathy) ([place])    Primarily nonischemic. Echo (12/14) with EF 25-30%. Echo (3/15) with EF 25%, mild to moderately dilated LV, normal RV size and systolic function.   . Pneumonia   . Urethral stricture    Past Surgical History:  Procedure Laterality Date  . ABDOMINAL AORTOGRAM W/LOWER EXTREMITY N/A 03/30/2016   Procedure: Abdominal Aortogram w/Lower Extremity;  Surgeon: Angelia Mould, MD;  Location: [place] [facility];  Service: Cardiovascular;  Laterality: N/A;  . AMPUTATION Right 04/26/2016   Procedure: Right Below Knee Amputation;  Surgeon: Newt Minion, MD;  Location: [place];  Service: Orthopedics;  Laterality: Right;  . AV FISTULA PLACEMENT Right 09/08/2015   Procedure: INSERTION OF 4-7m x 45cm  ARTERIOVENOUS (AV) GORE-TEX GRAFT RIGHT UPPER  ARM;  Surgeon: CAngelia Mould MD;  [place]  Service: Vascular;  Laterality: Right;  . AV FISTULA PLACEMENT Left 01/14/2016   Procedure: CREATION OF LEFT UPPER ARM  ARTERIOVENOUS FISTULA;  Surgeon: Angelia Mould, MD;  Location: [place];  Service: Vascular;  Laterality: Left;  . BASCILIC VEIN TRANSPOSITION Right 08/22/2014   Procedure: RIGHT UPPER ARM BASCILIC VEIN TRANSPOSITION;  Surgeon: Angelia Mould, MD;  Location: [place];  Service: Vascular;  Laterality: Right;  . BELOW KNEE LEG AMPUTATION Right 04/26/2016  . CARDIAC CATHETERIZATION    . CARDIAC DEFIBRILLATOR PLACEMENT  06/27/2013   Sub Q       BY DR Caryl Comes  . COLONOSCOPY WITH PROPOFOL N/A 07/22/2015   Procedure: COLONOSCOPY WITH PROPOFOL;  Surgeon: Doran Stabler, MD;  [facility];  Service: Gastroenterology;  Laterality: N/A;  . FEMORAL-POPLITEAL BYPASS GRAFT Right 03/31/2016   Procedure: BYPASS GRAFT FEMORAL-POPLITEAL ARTERY USING RIGHT GREATER SAPHENOUS NONREVERSED VEIN;  Surgeon: Angelia Mould, MD;  Location: [place];  Service: Vascular;  Laterality: Right;  . HERNIA REPAIR    . I&D EXTREMITY Right 03/31/2016   Procedure: IRRIGATION AND DEBRIDEMENT FOOT;  Surgeon: Angelia Mould, MD;  Location: [place];  Service: Vascular;  Laterality: Right;  . IMPLANTABLE CARDIOVERTER DEFIBRILLATOR IMPLANT N/A 06/27/2013   Procedure: SUB Q ICD;  Surgeon: Deboraha Sprang, MD;  Location: [facility] CATH LAB;  Service: Cardiovascular;  Laterality: N/A;  . INTRAOPERATIVE ARTERIOGRAM Right 03/31/2016   Procedure: INTRA OPERATIVE ARTERIOGRAM;  Surgeon: Angelia Mould, MD;  Location: [place];  Service: Vascular;  Laterality: Right;  . IR GENERIC HISTORICAL Right 11/30/2015   IR THROMBECTOMY AV FISTULA W/THROMBOLYSIS/PTA INC/SHUNT/IMG RIGHT 11/30/2015 Aletta Edouard, MD [facility]  . IR GENERIC HISTORICAL  11/30/2015   IR US GUIDE VASC ACCESS RIGHT 11/30/2015 Aletta Edouard, MD [facility]  . IR GENERIC HISTORICAL Right 12/15/2015   IR THROMBECTOMY AV FISTULA W/THROMBOLYSIS/PTA/STENT INC/SHUNT/IMG RT 12/15/2015 Arne Cleveland, MD [facility]  . IR GENERIC HISTORICAL  12/15/2015   IR US GUIDE VASC ACCESS RIGHT 12/15/2015 Arne Cleveland, MD [facility]  . IR GENERIC HISTORICAL  12/28/2015   IR FLUORO GUIDE CV LINE RIGHT 12/28/2015 Marybelle Killings, MD [facility]  . IR GENERIC HISTORICAL  12/28/2015   IR US GUIDE VASC ACCESS RIGHT 12/28/2015 Marybelle Killings, MD [facility]  . LEFT A ND RIGHT HEART CATH  01/30/2013   DR Sung Amabile  . LEFT AND RIGHT HEART CATHETERIZATION WITH CORONARY ANGIOGRAM N/A 01/30/2013   Procedure: LEFT AND RIGHT HEART CATHETERIZATION WITH CORONARY ANGIOGRAM;  Surgeon: Jolaine Artist, MD;  Location: [facility] CATH LAB;  Service:  Cardiovascular;  Laterality: N/A;  . PERIPHERAL VASCULAR CATHETERIZATION Right 01/26/2015   Procedure: A/V Fistulagram;  Surgeon: Angelia Mould, MD;  Location: [place] [facility];  Service: Cardiovascular;  Laterality: Right;  . reapea urethral surgery for recurrent obstruction  2011  . TOTAL KNEE ARTHROPLASTY Right 2007  . VEIN HARVEST Right 03/31/2016   Procedure: RIGHT GREATER SAPHENOUS VEIN HARVEST;  Surgeon: Angelia Mould, MD;  Location: [place];  Service: Vascular;  Laterality: Right;    reports that he quit smoking about 7 years ago. His smoking use included cigarettes. He has a 64.00 pack-year smoking history. He has never used smokeless tobacco. He reports that he does not drink alcohol or use drugs. family history includes Alcohol abuse in his father; Bladder Cancer in his mother; Diabetes in his maternal grandmother; Heart Problems in his maternal grandmother; Heart disease in his maternal grandfather; Melanoma in his father; Prostate cancer in his maternal grandfather; Stroke in his maternal grandmother. Allergies  Allergen Reactions  . Morphine Sulfate Rash  and Other (See Comments)    Itches all over, red spots     Review of Systems  Constitutional: Negative for fatigue.  Eyes: Negative for visual disturbance.  Respiratory: Positive for cough and wheezing. Negative for chest tightness and shortness of breath.   Cardiovascular: Negative for chest pain, palpitations and leg swelling.  Gastrointestinal: Negative for abdominal pain.  Neurological: Negative for dizziness, syncope, weakness, light-headedness and headaches.       Objective:   Physical Exam Constitutional:      Appearance: He is well-developed.  HENT:     Right Ear: External ear normal.     Left Ear: External ear normal.  Eyes:     Pupils: Pupils are equal, round, and reactive to light.  Neck:     Musculoskeletal: Neck supple.     Thyroid: No thyromegaly.  Cardiovascular:     Rate and Rhythm:  Normal rate and regular rhythm.  Pulmonary:     Effort: Pulmonary effort is normal. No respiratory distress.     Breath sounds: Wheezing present. No rales.  Skin:    Comments: He has some dry skin on both buttocks but no open ulcers.  Neurological:     Mental Status: He is alert and oriented to person, place, and time.        Assessment:     #1 type 2 diabetes poorly controlled with long history of poor compliance and multiple complications including end-stage renal disease and peripheral neuropathy  #2 hypertension.  Is elevated today.  This is managed by his nephrologist  #3 dyslipidemia  #4 dry skin dermatitis involving the buttocks  #5 recent influenza.  He has some residual cough and reactive airway changes on exam but no respiratory distress    Plan:     -Medrol 80 mg IM given.  He is aware that he needs to monitor blood sugars closely and that they may go up some with the shot  -Albuterol inhaler to use 2 puffs every 4-6 hours while needed  -Refill several medications including Lipitor, gabapentin, carvedilol, fenofibrate  -We will plan to check lipids at follow-up in 3 months  -Refill Humalog and he is strongly advised to get back on this regularly per sliding scale.  Also continue his Basaglar.  Recheck A1c at follow-up.  We did not see any need to adjust his Humalog sliding scale since he states he has not been using this at least 85% of the time.  -Recommend try A and D ointment to his buttock once or twice daily  [name] MD [facility] at [facility]

## 2018-02-16 NOTE — Patient Instructions (Signed)
Try some A and D ointment to buttocks two times daily.  Make sure to set up 3 month follow up.

## 2018-02-17 DIAGNOSIS — D631 Anemia in chronic kidney disease: Secondary | ICD-10-CM | POA: Diagnosis not present

## 2018-02-17 DIAGNOSIS — N2581 Secondary hyperparathyroidism of renal origin: Secondary | ICD-10-CM | POA: Diagnosis not present

## 2018-02-17 DIAGNOSIS — D509 Iron deficiency anemia, unspecified: Secondary | ICD-10-CM | POA: Diagnosis not present

## 2018-02-17 DIAGNOSIS — N186 End stage renal disease: Secondary | ICD-10-CM | POA: Diagnosis not present

## 2018-02-17 DIAGNOSIS — Z992 Dependence on renal dialysis: Secondary | ICD-10-CM | POA: Diagnosis not present

## 2018-02-20 DIAGNOSIS — D631 Anemia in chronic kidney disease: Secondary | ICD-10-CM | POA: Diagnosis not present

## 2018-02-20 DIAGNOSIS — N186 End stage renal disease: Secondary | ICD-10-CM | POA: Diagnosis not present

## 2018-02-20 DIAGNOSIS — D509 Iron deficiency anemia, unspecified: Secondary | ICD-10-CM | POA: Diagnosis not present

## 2018-02-20 DIAGNOSIS — N2581 Secondary hyperparathyroidism of renal origin: Secondary | ICD-10-CM | POA: Diagnosis not present

## 2018-02-20 DIAGNOSIS — Z992 Dependence on renal dialysis: Secondary | ICD-10-CM | POA: Diagnosis not present

## 2018-02-21 ENCOUNTER — Ambulatory Visit (INDEPENDENT_AMBULATORY_CARE_PROVIDER_SITE_OTHER): Payer: Medicare Other | Admitting: Family

## 2018-02-21 ENCOUNTER — Ambulatory Visit (INDEPENDENT_AMBULATORY_CARE_PROVIDER_SITE_OTHER): Payer: Medicare Other | Admitting: Orthopedic Surgery

## 2018-02-22 DIAGNOSIS — N186 End stage renal disease: Secondary | ICD-10-CM | POA: Diagnosis not present

## 2018-02-22 DIAGNOSIS — N2581 Secondary hyperparathyroidism of renal origin: Secondary | ICD-10-CM | POA: Diagnosis not present

## 2018-02-22 DIAGNOSIS — D631 Anemia in chronic kidney disease: Secondary | ICD-10-CM | POA: Diagnosis not present

## 2018-02-22 DIAGNOSIS — Z992 Dependence on renal dialysis: Secondary | ICD-10-CM | POA: Diagnosis not present

## 2018-02-22 DIAGNOSIS — D509 Iron deficiency anemia, unspecified: Secondary | ICD-10-CM | POA: Diagnosis not present

## 2018-02-24 DIAGNOSIS — N2581 Secondary hyperparathyroidism of renal origin: Secondary | ICD-10-CM | POA: Diagnosis not present

## 2018-02-24 DIAGNOSIS — N186 End stage renal disease: Secondary | ICD-10-CM | POA: Diagnosis not present

## 2018-02-24 DIAGNOSIS — D631 Anemia in chronic kidney disease: Secondary | ICD-10-CM | POA: Diagnosis not present

## 2018-02-24 DIAGNOSIS — D509 Iron deficiency anemia, unspecified: Secondary | ICD-10-CM | POA: Diagnosis not present

## 2018-02-24 DIAGNOSIS — Z992 Dependence on renal dialysis: Secondary | ICD-10-CM | POA: Diagnosis not present

## 2018-02-26 DIAGNOSIS — R05 Cough: Secondary | ICD-10-CM | POA: Diagnosis not present

## 2018-02-26 DIAGNOSIS — R0689 Other abnormalities of breathing: Secondary | ICD-10-CM | POA: Diagnosis not present

## 2018-02-26 DIAGNOSIS — R0602 Shortness of breath: Secondary | ICD-10-CM | POA: Diagnosis not present

## 2018-02-26 DIAGNOSIS — E669 Obesity, unspecified: Secondary | ICD-10-CM | POA: Diagnosis present

## 2018-02-26 DIAGNOSIS — E1165 Type 2 diabetes mellitus with hyperglycemia: Secondary | ICD-10-CM | POA: Diagnosis present

## 2018-02-26 DIAGNOSIS — Z89511 Acquired absence of right leg below knee: Secondary | ICD-10-CM | POA: Diagnosis not present

## 2018-02-26 DIAGNOSIS — E7800 Pure hypercholesterolemia, unspecified: Secondary | ICD-10-CM | POA: Diagnosis present

## 2018-02-26 DIAGNOSIS — Z8249 Family history of ischemic heart disease and other diseases of the circulatory system: Secondary | ICD-10-CM | POA: Diagnosis not present

## 2018-02-26 DIAGNOSIS — Z95810 Presence of automatic (implantable) cardiac defibrillator: Secondary | ICD-10-CM | POA: Diagnosis not present

## 2018-02-26 DIAGNOSIS — Z833 Family history of diabetes mellitus: Secondary | ICD-10-CM | POA: Diagnosis not present

## 2018-02-26 DIAGNOSIS — I10 Essential (primary) hypertension: Secondary | ICD-10-CM | POA: Diagnosis not present

## 2018-02-26 DIAGNOSIS — Z79899 Other long term (current) drug therapy: Secondary | ICD-10-CM | POA: Diagnosis not present

## 2018-02-26 DIAGNOSIS — J189 Pneumonia, unspecified organism: Secondary | ICD-10-CM | POA: Diagnosis not present

## 2018-02-26 DIAGNOSIS — E1122 Type 2 diabetes mellitus with diabetic chronic kidney disease: Secondary | ICD-10-CM | POA: Diagnosis present

## 2018-02-26 DIAGNOSIS — J441 Chronic obstructive pulmonary disease with (acute) exacerbation: Secondary | ICD-10-CM | POA: Diagnosis present

## 2018-02-26 DIAGNOSIS — Z992 Dependence on renal dialysis: Secondary | ICD-10-CM | POA: Diagnosis not present

## 2018-02-26 DIAGNOSIS — R079 Chest pain, unspecified: Secondary | ICD-10-CM | POA: Diagnosis not present

## 2018-02-26 DIAGNOSIS — R0902 Hypoxemia: Secondary | ICD-10-CM | POA: Diagnosis not present

## 2018-02-26 DIAGNOSIS — I959 Hypotension, unspecified: Secondary | ICD-10-CM | POA: Diagnosis not present

## 2018-02-26 DIAGNOSIS — I132 Hypertensive heart and chronic kidney disease with heart failure and with stage 5 chronic kidney disease, or end stage renal disease: Secondary | ICD-10-CM | POA: Diagnosis present

## 2018-02-26 DIAGNOSIS — Z9111 Patient's noncompliance with dietary regimen: Secondary | ICD-10-CM | POA: Diagnosis not present

## 2018-02-26 DIAGNOSIS — Z87891 Personal history of nicotine dependence: Secondary | ICD-10-CM | POA: Diagnosis not present

## 2018-02-26 DIAGNOSIS — I5022 Chronic systolic (congestive) heart failure: Secondary | ICD-10-CM | POA: Diagnosis not present

## 2018-02-26 DIAGNOSIS — Z7982 Long term (current) use of aspirin: Secondary | ICD-10-CM | POA: Diagnosis not present

## 2018-02-26 DIAGNOSIS — E1129 Type 2 diabetes mellitus with other diabetic kidney complication: Secondary | ICD-10-CM | POA: Diagnosis not present

## 2018-02-26 DIAGNOSIS — Z794 Long term (current) use of insulin: Secondary | ICD-10-CM | POA: Diagnosis not present

## 2018-02-26 DIAGNOSIS — N186 End stage renal disease: Secondary | ICD-10-CM | POA: Diagnosis present

## 2018-02-26 DIAGNOSIS — I428 Other cardiomyopathies: Secondary | ICD-10-CM | POA: Diagnosis present

## 2018-02-28 ENCOUNTER — Ambulatory Visit (INDEPENDENT_AMBULATORY_CARE_PROVIDER_SITE_OTHER): Payer: Medicare Other | Admitting: Family

## 2018-03-01 DIAGNOSIS — N2581 Secondary hyperparathyroidism of renal origin: Secondary | ICD-10-CM | POA: Diagnosis not present

## 2018-03-01 DIAGNOSIS — N186 End stage renal disease: Secondary | ICD-10-CM | POA: Diagnosis not present

## 2018-03-01 DIAGNOSIS — D631 Anemia in chronic kidney disease: Secondary | ICD-10-CM | POA: Diagnosis not present

## 2018-03-01 DIAGNOSIS — D509 Iron deficiency anemia, unspecified: Secondary | ICD-10-CM | POA: Diagnosis not present

## 2018-03-01 DIAGNOSIS — Z992 Dependence on renal dialysis: Secondary | ICD-10-CM | POA: Diagnosis not present

## 2018-03-03 DIAGNOSIS — N2581 Secondary hyperparathyroidism of renal origin: Secondary | ICD-10-CM | POA: Diagnosis not present

## 2018-03-03 DIAGNOSIS — D631 Anemia in chronic kidney disease: Secondary | ICD-10-CM | POA: Diagnosis not present

## 2018-03-03 DIAGNOSIS — Z992 Dependence on renal dialysis: Secondary | ICD-10-CM | POA: Diagnosis not present

## 2018-03-03 DIAGNOSIS — N186 End stage renal disease: Secondary | ICD-10-CM | POA: Diagnosis not present

## 2018-03-03 DIAGNOSIS — D509 Iron deficiency anemia, unspecified: Secondary | ICD-10-CM | POA: Diagnosis not present

## 2018-03-06 ENCOUNTER — Telehealth: Payer: Self-pay | Admitting: Family Medicine

## 2018-03-06 ENCOUNTER — Telehealth (INDEPENDENT_AMBULATORY_CARE_PROVIDER_SITE_OTHER): Payer: Self-pay | Admitting: Orthopedic Surgery

## 2018-03-06 DIAGNOSIS — R0602 Shortness of breath: Secondary | ICD-10-CM | POA: Diagnosis not present

## 2018-03-06 DIAGNOSIS — Z8249 Family history of ischemic heart disease and other diseases of the circulatory system: Secondary | ICD-10-CM | POA: Diagnosis not present

## 2018-03-06 DIAGNOSIS — I160 Hypertensive urgency: Secondary | ICD-10-CM | POA: Diagnosis not present

## 2018-03-06 DIAGNOSIS — I5023 Acute on chronic systolic (congestive) heart failure: Secondary | ICD-10-CM | POA: Diagnosis present

## 2018-03-06 DIAGNOSIS — Z89511 Acquired absence of right leg below knee: Secondary | ICD-10-CM | POA: Diagnosis not present

## 2018-03-06 DIAGNOSIS — I10 Essential (primary) hypertension: Secondary | ICD-10-CM | POA: Diagnosis not present

## 2018-03-06 DIAGNOSIS — Z87891 Personal history of nicotine dependence: Secondary | ICD-10-CM | POA: Diagnosis not present

## 2018-03-06 DIAGNOSIS — R0689 Other abnormalities of breathing: Secondary | ICD-10-CM | POA: Diagnosis not present

## 2018-03-06 DIAGNOSIS — R7989 Other specified abnormal findings of blood chemistry: Secondary | ICD-10-CM | POA: Diagnosis not present

## 2018-03-06 DIAGNOSIS — Z992 Dependence on renal dialysis: Secondary | ICD-10-CM | POA: Diagnosis not present

## 2018-03-06 DIAGNOSIS — R0902 Hypoxemia: Secondary | ICD-10-CM | POA: Diagnosis not present

## 2018-03-06 DIAGNOSIS — J441 Chronic obstructive pulmonary disease with (acute) exacerbation: Secondary | ICD-10-CM | POA: Diagnosis not present

## 2018-03-06 DIAGNOSIS — E1165 Type 2 diabetes mellitus with hyperglycemia: Secondary | ICD-10-CM | POA: Diagnosis present

## 2018-03-06 DIAGNOSIS — E119 Type 2 diabetes mellitus without complications: Secondary | ICD-10-CM | POA: Diagnosis not present

## 2018-03-06 DIAGNOSIS — E785 Hyperlipidemia, unspecified: Secondary | ICD-10-CM | POA: Diagnosis present

## 2018-03-06 DIAGNOSIS — Z7982 Long term (current) use of aspirin: Secondary | ICD-10-CM | POA: Diagnosis not present

## 2018-03-06 DIAGNOSIS — I169 Hypertensive crisis, unspecified: Secondary | ICD-10-CM | POA: Diagnosis not present

## 2018-03-06 DIAGNOSIS — J449 Chronic obstructive pulmonary disease, unspecified: Secondary | ICD-10-CM | POA: Diagnosis present

## 2018-03-06 DIAGNOSIS — I120 Hypertensive chronic kidney disease with stage 5 chronic kidney disease or end stage renal disease: Secondary | ICD-10-CM | POA: Diagnosis present

## 2018-03-06 DIAGNOSIS — N186 End stage renal disease: Secondary | ICD-10-CM | POA: Diagnosis present

## 2018-03-06 DIAGNOSIS — R079 Chest pain, unspecified: Secondary | ICD-10-CM | POA: Diagnosis not present

## 2018-03-06 DIAGNOSIS — N200 Calculus of kidney: Secondary | ICD-10-CM | POA: Diagnosis present

## 2018-03-06 DIAGNOSIS — J9601 Acute respiratory failure with hypoxia: Secondary | ICD-10-CM | POA: Diagnosis present

## 2018-03-06 DIAGNOSIS — E1122 Type 2 diabetes mellitus with diabetic chronic kidney disease: Secondary | ICD-10-CM | POA: Diagnosis present

## 2018-03-06 DIAGNOSIS — R069 Unspecified abnormalities of breathing: Secondary | ICD-10-CM | POA: Diagnosis not present

## 2018-03-06 DIAGNOSIS — Z833 Family history of diabetes mellitus: Secondary | ICD-10-CM | POA: Diagnosis not present

## 2018-03-06 DIAGNOSIS — R509 Fever, unspecified: Secondary | ICD-10-CM | POA: Diagnosis not present

## 2018-03-06 DIAGNOSIS — I4891 Unspecified atrial fibrillation: Secondary | ICD-10-CM | POA: Diagnosis not present

## 2018-03-06 DIAGNOSIS — J189 Pneumonia, unspecified organism: Secondary | ICD-10-CM | POA: Diagnosis not present

## 2018-03-06 DIAGNOSIS — Z95810 Presence of automatic (implantable) cardiac defibrillator: Secondary | ICD-10-CM | POA: Diagnosis not present

## 2018-03-06 DIAGNOSIS — Z794 Long term (current) use of insulin: Secondary | ICD-10-CM | POA: Diagnosis not present

## 2018-03-06 DIAGNOSIS — E1142 Type 2 diabetes mellitus with diabetic polyneuropathy: Secondary | ICD-10-CM | POA: Diagnosis present

## 2018-03-06 DIAGNOSIS — E1151 Type 2 diabetes mellitus with diabetic peripheral angiopathy without gangrene: Secondary | ICD-10-CM | POA: Diagnosis present

## 2018-03-06 DIAGNOSIS — I428 Other cardiomyopathies: Secondary | ICD-10-CM | POA: Diagnosis not present

## 2018-03-06 NOTE — Telephone Encounter (Signed)
Pt wife called to cancel apt for tomorrow due to him being in the hospital with pneumonia

## 2018-03-06 NOTE — Telephone Encounter (Signed)
Ok, Thanks

## 2018-03-07 ENCOUNTER — Ambulatory Visit (INDEPENDENT_AMBULATORY_CARE_PROVIDER_SITE_OTHER): Payer: Medicare Other | Admitting: Family

## 2018-03-07 ENCOUNTER — Inpatient Hospital Stay: Payer: Medicare Other | Admitting: Family Medicine

## 2018-03-08 DIAGNOSIS — N2581 Secondary hyperparathyroidism of renal origin: Secondary | ICD-10-CM | POA: Diagnosis not present

## 2018-03-08 DIAGNOSIS — D509 Iron deficiency anemia, unspecified: Secondary | ICD-10-CM | POA: Diagnosis not present

## 2018-03-08 DIAGNOSIS — Z992 Dependence on renal dialysis: Secondary | ICD-10-CM | POA: Diagnosis not present

## 2018-03-08 DIAGNOSIS — N186 End stage renal disease: Secondary | ICD-10-CM | POA: Diagnosis not present

## 2018-03-08 DIAGNOSIS — D631 Anemia in chronic kidney disease: Secondary | ICD-10-CM | POA: Diagnosis not present

## 2018-03-10 DIAGNOSIS — N2581 Secondary hyperparathyroidism of renal origin: Secondary | ICD-10-CM | POA: Diagnosis not present

## 2018-03-10 DIAGNOSIS — N186 End stage renal disease: Secondary | ICD-10-CM | POA: Diagnosis not present

## 2018-03-10 DIAGNOSIS — D509 Iron deficiency anemia, unspecified: Secondary | ICD-10-CM | POA: Diagnosis not present

## 2018-03-10 DIAGNOSIS — Z992 Dependence on renal dialysis: Secondary | ICD-10-CM | POA: Diagnosis not present

## 2018-03-10 DIAGNOSIS — D631 Anemia in chronic kidney disease: Secondary | ICD-10-CM | POA: Diagnosis not present

## 2018-03-12 ENCOUNTER — Inpatient Hospital Stay: Payer: Medicare Other | Admitting: Family Medicine

## 2018-03-12 DIAGNOSIS — E8770 Fluid overload, unspecified: Secondary | ICD-10-CM | POA: Diagnosis not present

## 2018-03-12 DIAGNOSIS — N186 End stage renal disease: Secondary | ICD-10-CM | POA: Diagnosis not present

## 2018-03-12 DIAGNOSIS — E8779 Other fluid overload: Secondary | ICD-10-CM | POA: Diagnosis not present

## 2018-03-12 DIAGNOSIS — Z992 Dependence on renal dialysis: Secondary | ICD-10-CM | POA: Diagnosis not present

## 2018-03-13 DIAGNOSIS — D631 Anemia in chronic kidney disease: Secondary | ICD-10-CM | POA: Diagnosis not present

## 2018-03-13 DIAGNOSIS — Z992 Dependence on renal dialysis: Secondary | ICD-10-CM | POA: Diagnosis not present

## 2018-03-13 DIAGNOSIS — N2581 Secondary hyperparathyroidism of renal origin: Secondary | ICD-10-CM | POA: Diagnosis not present

## 2018-03-13 DIAGNOSIS — D509 Iron deficiency anemia, unspecified: Secondary | ICD-10-CM | POA: Diagnosis not present

## 2018-03-13 DIAGNOSIS — N186 End stage renal disease: Secondary | ICD-10-CM | POA: Diagnosis not present

## 2018-03-15 DIAGNOSIS — D509 Iron deficiency anemia, unspecified: Secondary | ICD-10-CM | POA: Diagnosis not present

## 2018-03-15 DIAGNOSIS — N186 End stage renal disease: Secondary | ICD-10-CM | POA: Diagnosis not present

## 2018-03-15 DIAGNOSIS — Z992 Dependence on renal dialysis: Secondary | ICD-10-CM | POA: Diagnosis not present

## 2018-03-15 DIAGNOSIS — N2581 Secondary hyperparathyroidism of renal origin: Secondary | ICD-10-CM | POA: Diagnosis not present

## 2018-03-15 DIAGNOSIS — D631 Anemia in chronic kidney disease: Secondary | ICD-10-CM | POA: Diagnosis not present

## 2018-03-16 ENCOUNTER — Ambulatory Visit (INDEPENDENT_AMBULATORY_CARE_PROVIDER_SITE_OTHER): Payer: Medicare Other | Admitting: Family Medicine

## 2018-03-16 ENCOUNTER — Encounter: Payer: Self-pay | Admitting: Family Medicine

## 2018-03-16 ENCOUNTER — Other Ambulatory Visit: Payer: Self-pay

## 2018-03-16 VITALS — BP 138/74 | HR 81 | Temp 98.0°F | Ht 70.0 in | Wt 206.3 lb

## 2018-03-16 DIAGNOSIS — J189 Pneumonia, unspecified organism: Secondary | ICD-10-CM

## 2018-03-16 DIAGNOSIS — E1165 Type 2 diabetes mellitus with hyperglycemia: Secondary | ICD-10-CM | POA: Diagnosis not present

## 2018-03-16 DIAGNOSIS — E1129 Type 2 diabetes mellitus with other diabetic kidney complication: Secondary | ICD-10-CM | POA: Diagnosis not present

## 2018-03-16 DIAGNOSIS — I5022 Chronic systolic (congestive) heart failure: Secondary | ICD-10-CM

## 2018-03-16 MED ORDER — INSULIN LISPRO 200 UNIT/ML ~~LOC~~ SOPN: 10.0000 [IU] | PEN_INJECTOR | Freq: Three times a day (TID) | SUBCUTANEOUS | 6 refills | Status: DC

## 2018-03-16 NOTE — Patient Instructions (Signed)
Follow up for any recurrent fever or increased shortness of breath.

## 2018-03-16 NOTE — Progress Notes (Signed)
Subjective:     Patient ID: Anthony Bullock, male   DOB: 09-29-1960, 58 y.o.   MRN: [id number]  HPI Patient is seen for hospital follow-up.  He was at [facility] 2 times recently.  Unfortunately, we are unable to access those records through [place] .  He has multiple chronic problems including end-stage renal disease on dialysis.  He has poorly controlled type 2 diabetes.  Patient had recent influenza back in early February.  This was followed by reported pneumonia and he was admitted there Monday through Wednesday couple weeks ago.  He was feeling somewhat better and discharged but then developed some recurrent dyspnea.  Patient was then readmitted the next week with increased heart failure and persistent pneumonia.  He was given dialysis and IV antibiotics and improved.  He was discharged 9 days ago and feels well at this time.  Only minimal cough.  No fever.  No dyspnea.  Patient states he had positive "MRSA" but he is not sure if this was a nasal swab or sputum or blood culture.  In any event he has had no recurrent symptoms since discharge.  Poorly controlled type 2 diabetes.  Very poor compliance with diet.  Currently has Humalog for sliding scale but he has vial and is requesting KwikPen.  He remains on basaglar 55 units once daily  Past Medical History:  Diagnosis Date  . 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 Allergic rhinitis   . Anemia   . Arthritis   . Chronic systolic heart failure ([place])    a. ECHO (12/2012) EF 25-30%, HK entireanteroseptal myocardium //  b.  EF 25%, diffuse HK, grade 1 diastolic dysfunction, MAC, mild LAE, normal RVSF, trivial pericardial effusion  . COPD (chronic obstructive pulmonary disease) ([place])   . Diabetes mellitus type II   . Diabetic nephropathy ([place])   . Diabetic neuropathy ([place])   . ESRD on hemodialysis [facility])    started HD June 2017, goes to [facility] HD unit, Dr Hinda Lenis  . History of  cardiac catheterization    a.Myoview 1/15:  There is significant left ventricular dysfunction. There may be slight scar at the apex. There is no significant ischemia. LV Ejection Fraction: 27%  //  b. RHC/LHC (1/15) with mean RA 6, PA 47/22 mean 33, mean PCWP 20, PVR 2.5 WU, CI 2.5; 80% dLAD stenosis, 70% diffuse large D.   . History of kidney stones   . Hyperlipidemia   . Hypertension   . Kidney stones   . NICM (nonischemic cardiomyopathy) ([place])    Primarily nonischemic. Echo (12/14) with EF 25-30%. Echo (3/15) with EF 25%, mild to moderately dilated LV, normal RV size and systolic function.   . Pneumonia   . Urethral stricture    Past Surgical History:  Procedure Laterality Date  . ABDOMINAL AORTOGRAM W/LOWER EXTREMITY N/A 03/30/2016   Procedure: Abdominal Aortogram w/Lower Extremity;  Surgeon: Angelia Mould, MD;  Location: [place] [facility];  Service: Cardiovascular;  Laterality: N/A;  . AMPUTATION Right 04/26/2016   Procedure: Right Below Knee Amputation;  Surgeon: Newt Minion, MD;  Location: [place];  Service: Orthopedics;  Laterality: Right;  . AV FISTULA PLACEMENT Right 09/08/2015   Procedure: INSERTION OF 4-49m x 45cm  ARTERIOVENOUS (AV) GORE-TEX GRAFT RIGHT UPPER  ARM;  Surgeon: CAngelia Mould MD;  Location: [place]  Service: Vascular;  Laterality: Right;  . AV FISTULA PLACEMENT Left 01/14/2016  Procedure: CREATION OF LEFT UPPER ARM ARTERIOVENOUS FISTULA;  Surgeon: Angelia Mould, MD;  Location: [place];  Service: Vascular;  Laterality: Left;  . BASCILIC VEIN TRANSPOSITION Right 08/22/2014   Procedure: RIGHT UPPER ARM BASCILIC VEIN TRANSPOSITION;  Surgeon: Angelia Mould, MD;  Location: [place];  Service: Vascular;  Laterality: Right;  . BELOW KNEE LEG AMPUTATION Right 04/26/2016  . CARDIAC CATHETERIZATION    . CARDIAC DEFIBRILLATOR PLACEMENT  06/27/2013   Sub Q       BY DR Caryl Comes  . COLONOSCOPY WITH PROPOFOL N/A 07/22/2015   Procedure: COLONOSCOPY WITH  PROPOFOL;  Surgeon: Doran Stabler, MD;  [facility];  Service: Gastroenterology;  Laterality: N/A;  . FEMORAL-POPLITEAL BYPASS GRAFT Right 03/31/2016   Procedure: BYPASS GRAFT FEMORAL-POPLITEAL ARTERY USING RIGHT GREATER SAPHENOUS NONREVERSED VEIN;  Surgeon: Angelia Mould, MD;  Location: [place];  Service: Vascular;  Laterality: Right;  . HERNIA REPAIR    . I&D EXTREMITY Right 03/31/2016   Procedure: IRRIGATION AND DEBRIDEMENT FOOT;  Surgeon: Angelia Mould, MD;  Location: [place];  Service: Vascular;  Laterality: Right;  . IMPLANTABLE CARDIOVERTER DEFIBRILLATOR IMPLANT N/A 06/27/2013   Procedure: SUB Q ICD;  Surgeon: Deboraha Sprang, MD;  Location: [facility] CATH LAB;  Service: Cardiovascular;  Laterality: N/A;  . INTRAOPERATIVE ARTERIOGRAM Right 03/31/2016   Procedure: INTRA OPERATIVE ARTERIOGRAM;  Surgeon: Angelia Mould, MD;  Location: [place];  Service: Vascular;  Laterality: Right;  . IR GENERIC HISTORICAL Right 11/30/2015   IR THROMBECTOMY AV FISTULA W/THROMBOLYSIS/PTA INC/SHUNT/IMG RIGHT 11/30/2015 Aletta Edouard, MD [facility]  . IR GENERIC HISTORICAL  11/30/2015   IR US GUIDE VASC ACCESS RIGHT 11/30/2015 Aletta Edouard, MD [facility]  . IR GENERIC HISTORICAL Right 12/15/2015   IR THROMBECTOMY AV FISTULA W/THROMBOLYSIS/PTA/STENT INC/SHUNT/IMG RT 12/15/2015 Arne Cleveland, MD [facility]  . IR GENERIC HISTORICAL  12/15/2015   IR US GUIDE VASC ACCESS RIGHT 12/15/2015 Arne Cleveland, MD [facility]  . IR GENERIC HISTORICAL  12/28/2015   IR FLUORO GUIDE CV LINE RIGHT 12/28/2015 Marybelle Killings, MD [facility]  . IR GENERIC HISTORICAL  12/28/2015   IR US GUIDE VASC ACCESS RIGHT 12/28/2015 Marybelle Killings, MD [facility]  . LEFT A ND RIGHT HEART CATH  01/30/2013   DR Sung Amabile  . LEFT AND RIGHT HEART CATHETERIZATION WITH CORONARY ANGIOGRAM N/A 01/30/2013   Procedure: LEFT AND RIGHT HEART CATHETERIZATION WITH CORONARY ANGIOGRAM;  Surgeon: Jolaine Artist, MD;   Location: [facility] C[facility];  Service: Cardiovascular;  Laterality: N/A;  . PERIPHERAL VASCULAR CATHETERIZATION Right 01/26/2015   Procedure: A/V Fistulagram;  Surgeon: Angelia Mould, MD;  Location: [place] [facility];  Service: Cardiovascular;  Laterality: Right;  . reapea urethral surgery for recurrent obstruction  2011  . TOTAL KNEE ARTHROPLASTY Right 2007  . VEIN HARVEST Right 03/31/2016   Procedure: RIGHT GREATER SAPHENOUS VEIN HARVEST;  Surgeon: Angelia Mould, MD;  Location: [place];  Service: Vascular;  Laterality: Right;    reports that he quit smoking about 7 years ago. His smoking use included cigarettes. He has a 64.00 pack-year smoking history. He has never used smokeless tobacco. He reports that he does not drink alcohol or use drugs. family history includes Alcohol abuse in his father; Bladder Cancer in his mother; Diabetes in his maternal grandmother; Heart Problems in his maternal grandmother; Heart disease in his maternal grandfather; Melanoma in his father; Prostate cancer in his maternal grandfather; Stroke in his maternal grandmother. Allergies  Allergen  Reactions  . Morphine Sulfate Rash and Other (See Comments)    Itches all over, red spots     Review of Systems  Constitutional: Negative for chills and fever.  Respiratory: Negative for shortness of breath and wheezing.   Cardiovascular: Negative for chest pain and leg swelling.  Gastrointestinal: Negative for diarrhea, nausea and vomiting.  Neurological: Negative for dizziness.  Psychiatric/Behavioral: Negative for confusion.       Objective:   Physical Exam Vitals signs reviewed.  Constitutional:      Appearance: Normal appearance.  HENT:     Right Ear: Tympanic membrane normal.     Left Ear: Tympanic membrane normal.     Mouth/Throat:     Pharynx: Oropharynx is clear. No oropharyngeal exudate.  Cardiovascular:     Rate and Rhythm: Normal rate and regular rhythm.  Pulmonary:     Effort: Pulmonary  effort is normal.     Breath sounds: Normal breath sounds. No wheezing or rales.  Neurological:     Mental Status: He is alert.        Assessment:     #1 patient reportedly had recent community-acquired pneumonia.  We do not have records at this time.  He had 2 recent admissions as above and is doing better clinically at this time  #2 history of systolic heart failure currently stable  #3 poorly controlled type 2 diabetes.  Poor compliance with diet    Plan:     -Refilled his Humalog for KwikPen which he will use 3 times daily with sliding scale which has been previously given. -We have asked that when he returns for follow-up he bring log of some home readings for [name] to review -Follow-up promptly for any fever or recurrent shortness of breath  [name] MD [facility] at [facility]

## 2018-03-17 DIAGNOSIS — D509 Iron deficiency anemia, unspecified: Secondary | ICD-10-CM | POA: Diagnosis not present

## 2018-03-17 DIAGNOSIS — N186 End stage renal disease: Secondary | ICD-10-CM | POA: Diagnosis not present

## 2018-03-17 DIAGNOSIS — N2581 Secondary hyperparathyroidism of renal origin: Secondary | ICD-10-CM | POA: Diagnosis not present

## 2018-03-17 DIAGNOSIS — D631 Anemia in chronic kidney disease: Secondary | ICD-10-CM | POA: Diagnosis not present

## 2018-03-17 DIAGNOSIS — Z992 Dependence on renal dialysis: Secondary | ICD-10-CM | POA: Diagnosis not present

## 2018-03-19 ENCOUNTER — Ambulatory Visit (INDEPENDENT_AMBULATORY_CARE_PROVIDER_SITE_OTHER): Payer: Medicare Other | Admitting: Orthopedic Surgery

## 2018-03-19 DIAGNOSIS — Z992 Dependence on renal dialysis: Secondary | ICD-10-CM | POA: Diagnosis not present

## 2018-03-19 DIAGNOSIS — E8779 Other fluid overload: Secondary | ICD-10-CM | POA: Diagnosis not present

## 2018-03-19 DIAGNOSIS — E8770 Fluid overload, unspecified: Secondary | ICD-10-CM | POA: Diagnosis not present

## 2018-03-19 DIAGNOSIS — N186 End stage renal disease: Secondary | ICD-10-CM | POA: Diagnosis not present

## 2018-03-20 DIAGNOSIS — N2581 Secondary hyperparathyroidism of renal origin: Secondary | ICD-10-CM | POA: Diagnosis not present

## 2018-03-20 DIAGNOSIS — D509 Iron deficiency anemia, unspecified: Secondary | ICD-10-CM | POA: Diagnosis not present

## 2018-03-20 DIAGNOSIS — Z992 Dependence on renal dialysis: Secondary | ICD-10-CM | POA: Diagnosis not present

## 2018-03-20 DIAGNOSIS — N186 End stage renal disease: Secondary | ICD-10-CM | POA: Diagnosis not present

## 2018-03-20 DIAGNOSIS — D631 Anemia in chronic kidney disease: Secondary | ICD-10-CM | POA: Diagnosis not present

## 2018-03-21 DIAGNOSIS — E113293 Type 2 diabetes mellitus with mild nonproliferative diabetic retinopathy without macular edema, bilateral: Secondary | ICD-10-CM | POA: Diagnosis not present

## 2018-03-21 DIAGNOSIS — H04123 Dry eye syndrome of bilateral lacrimal glands: Secondary | ICD-10-CM | POA: Diagnosis not present

## 2018-03-21 DIAGNOSIS — H25813 Combined forms of age-related cataract, bilateral: Secondary | ICD-10-CM | POA: Diagnosis not present

## 2018-03-21 LAB — HM DIABETES EYE EXAM

## 2018-03-21 NOTE — Telephone Encounter (Signed)
Open in error

## 2018-03-22 DIAGNOSIS — N2581 Secondary hyperparathyroidism of renal origin: Secondary | ICD-10-CM | POA: Diagnosis not present

## 2018-03-22 DIAGNOSIS — D631 Anemia in chronic kidney disease: Secondary | ICD-10-CM | POA: Diagnosis not present

## 2018-03-22 DIAGNOSIS — D509 Iron deficiency anemia, unspecified: Secondary | ICD-10-CM | POA: Diagnosis not present

## 2018-03-22 DIAGNOSIS — N186 End stage renal disease: Secondary | ICD-10-CM | POA: Diagnosis not present

## 2018-03-22 DIAGNOSIS — Z992 Dependence on renal dialysis: Secondary | ICD-10-CM | POA: Diagnosis not present

## 2018-03-24 DIAGNOSIS — D631 Anemia in chronic kidney disease: Secondary | ICD-10-CM | POA: Diagnosis not present

## 2018-03-24 DIAGNOSIS — Z992 Dependence on renal dialysis: Secondary | ICD-10-CM | POA: Diagnosis not present

## 2018-03-24 DIAGNOSIS — D509 Iron deficiency anemia, unspecified: Secondary | ICD-10-CM | POA: Diagnosis not present

## 2018-03-24 DIAGNOSIS — N2581 Secondary hyperparathyroidism of renal origin: Secondary | ICD-10-CM | POA: Diagnosis not present

## 2018-03-24 DIAGNOSIS — N186 End stage renal disease: Secondary | ICD-10-CM | POA: Diagnosis not present

## 2018-03-26 DIAGNOSIS — E8779 Other fluid overload: Secondary | ICD-10-CM | POA: Diagnosis not present

## 2018-03-26 DIAGNOSIS — E8770 Fluid overload, unspecified: Secondary | ICD-10-CM | POA: Diagnosis not present

## 2018-03-26 DIAGNOSIS — Z992 Dependence on renal dialysis: Secondary | ICD-10-CM | POA: Diagnosis not present

## 2018-03-26 DIAGNOSIS — N186 End stage renal disease: Secondary | ICD-10-CM | POA: Diagnosis not present

## 2018-03-27 DIAGNOSIS — D631 Anemia in chronic kidney disease: Secondary | ICD-10-CM | POA: Diagnosis not present

## 2018-03-27 DIAGNOSIS — N186 End stage renal disease: Secondary | ICD-10-CM | POA: Diagnosis not present

## 2018-03-27 DIAGNOSIS — Z992 Dependence on renal dialysis: Secondary | ICD-10-CM | POA: Diagnosis not present

## 2018-03-27 DIAGNOSIS — N2581 Secondary hyperparathyroidism of renal origin: Secondary | ICD-10-CM | POA: Diagnosis not present

## 2018-03-27 DIAGNOSIS — D509 Iron deficiency anemia, unspecified: Secondary | ICD-10-CM | POA: Diagnosis not present

## 2018-03-28 ENCOUNTER — Ambulatory Visit (INDEPENDENT_AMBULATORY_CARE_PROVIDER_SITE_OTHER): Payer: Medicare Other | Admitting: Orthopedic Surgery

## 2018-03-29 DIAGNOSIS — D631 Anemia in chronic kidney disease: Secondary | ICD-10-CM | POA: Diagnosis not present

## 2018-03-29 DIAGNOSIS — D509 Iron deficiency anemia, unspecified: Secondary | ICD-10-CM | POA: Diagnosis not present

## 2018-03-29 DIAGNOSIS — Z992 Dependence on renal dialysis: Secondary | ICD-10-CM | POA: Diagnosis not present

## 2018-03-29 DIAGNOSIS — N2581 Secondary hyperparathyroidism of renal origin: Secondary | ICD-10-CM | POA: Diagnosis not present

## 2018-03-29 DIAGNOSIS — N186 End stage renal disease: Secondary | ICD-10-CM | POA: Diagnosis not present

## 2018-03-31 DIAGNOSIS — D509 Iron deficiency anemia, unspecified: Secondary | ICD-10-CM | POA: Diagnosis not present

## 2018-03-31 DIAGNOSIS — N186 End stage renal disease: Secondary | ICD-10-CM | POA: Diagnosis not present

## 2018-03-31 DIAGNOSIS — D631 Anemia in chronic kidney disease: Secondary | ICD-10-CM | POA: Diagnosis not present

## 2018-03-31 DIAGNOSIS — N2581 Secondary hyperparathyroidism of renal origin: Secondary | ICD-10-CM | POA: Diagnosis not present

## 2018-03-31 DIAGNOSIS — Z992 Dependence on renal dialysis: Secondary | ICD-10-CM | POA: Diagnosis not present

## 2018-04-03 DIAGNOSIS — Z992 Dependence on renal dialysis: Secondary | ICD-10-CM | POA: Diagnosis not present

## 2018-04-03 DIAGNOSIS — E119 Type 2 diabetes mellitus without complications: Secondary | ICD-10-CM | POA: Diagnosis not present

## 2018-04-03 DIAGNOSIS — N2581 Secondary hyperparathyroidism of renal origin: Secondary | ICD-10-CM | POA: Diagnosis not present

## 2018-04-03 DIAGNOSIS — Z794 Long term (current) use of insulin: Secondary | ICD-10-CM | POA: Diagnosis not present

## 2018-04-03 DIAGNOSIS — N186 End stage renal disease: Secondary | ICD-10-CM | POA: Diagnosis not present

## 2018-04-03 DIAGNOSIS — D631 Anemia in chronic kidney disease: Secondary | ICD-10-CM | POA: Diagnosis not present

## 2018-04-03 DIAGNOSIS — D509 Iron deficiency anemia, unspecified: Secondary | ICD-10-CM | POA: Diagnosis not present

## 2018-04-05 DIAGNOSIS — D631 Anemia in chronic kidney disease: Secondary | ICD-10-CM | POA: Diagnosis not present

## 2018-04-05 DIAGNOSIS — D509 Iron deficiency anemia, unspecified: Secondary | ICD-10-CM | POA: Diagnosis not present

## 2018-04-05 DIAGNOSIS — Z992 Dependence on renal dialysis: Secondary | ICD-10-CM | POA: Diagnosis not present

## 2018-04-05 DIAGNOSIS — N186 End stage renal disease: Secondary | ICD-10-CM | POA: Diagnosis not present

## 2018-04-05 DIAGNOSIS — N2581 Secondary hyperparathyroidism of renal origin: Secondary | ICD-10-CM | POA: Diagnosis not present

## 2018-04-07 DIAGNOSIS — D509 Iron deficiency anemia, unspecified: Secondary | ICD-10-CM | POA: Diagnosis not present

## 2018-04-07 DIAGNOSIS — N186 End stage renal disease: Secondary | ICD-10-CM | POA: Diagnosis not present

## 2018-04-07 DIAGNOSIS — N2581 Secondary hyperparathyroidism of renal origin: Secondary | ICD-10-CM | POA: Diagnosis not present

## 2018-04-07 DIAGNOSIS — D631 Anemia in chronic kidney disease: Secondary | ICD-10-CM | POA: Diagnosis not present

## 2018-04-07 DIAGNOSIS — Z992 Dependence on renal dialysis: Secondary | ICD-10-CM | POA: Diagnosis not present

## 2018-04-10 DIAGNOSIS — D631 Anemia in chronic kidney disease: Secondary | ICD-10-CM | POA: Diagnosis not present

## 2018-04-10 DIAGNOSIS — Z992 Dependence on renal dialysis: Secondary | ICD-10-CM | POA: Diagnosis not present

## 2018-04-10 DIAGNOSIS — N186 End stage renal disease: Secondary | ICD-10-CM | POA: Diagnosis not present

## 2018-04-10 DIAGNOSIS — N2581 Secondary hyperparathyroidism of renal origin: Secondary | ICD-10-CM | POA: Diagnosis not present

## 2018-04-10 DIAGNOSIS — D509 Iron deficiency anemia, unspecified: Secondary | ICD-10-CM | POA: Diagnosis not present

## 2018-04-12 DIAGNOSIS — Z992 Dependence on renal dialysis: Secondary | ICD-10-CM | POA: Diagnosis not present

## 2018-04-12 DIAGNOSIS — D631 Anemia in chronic kidney disease: Secondary | ICD-10-CM | POA: Diagnosis not present

## 2018-04-12 DIAGNOSIS — N2581 Secondary hyperparathyroidism of renal origin: Secondary | ICD-10-CM | POA: Diagnosis not present

## 2018-04-12 DIAGNOSIS — N186 End stage renal disease: Secondary | ICD-10-CM | POA: Diagnosis not present

## 2018-04-12 DIAGNOSIS — D509 Iron deficiency anemia, unspecified: Secondary | ICD-10-CM | POA: Diagnosis not present

## 2018-04-13 ENCOUNTER — Other Ambulatory Visit: Payer: Self-pay | Admitting: Family Medicine

## 2018-04-14 DIAGNOSIS — N186 End stage renal disease: Secondary | ICD-10-CM | POA: Diagnosis not present

## 2018-04-14 DIAGNOSIS — D509 Iron deficiency anemia, unspecified: Secondary | ICD-10-CM | POA: Diagnosis not present

## 2018-04-14 DIAGNOSIS — D631 Anemia in chronic kidney disease: Secondary | ICD-10-CM | POA: Diagnosis not present

## 2018-04-14 DIAGNOSIS — N2581 Secondary hyperparathyroidism of renal origin: Secondary | ICD-10-CM | POA: Diagnosis not present

## 2018-04-14 DIAGNOSIS — Z992 Dependence on renal dialysis: Secondary | ICD-10-CM | POA: Diagnosis not present

## 2018-04-16 ENCOUNTER — Other Ambulatory Visit: Payer: Self-pay

## 2018-04-16 MED ORDER — FUROSEMIDE 40 MG PO TABS: 120.0000 mg | ORAL_TABLET | Freq: Every day | ORAL | 0 refills | Status: DC

## 2018-04-17 DIAGNOSIS — N2581 Secondary hyperparathyroidism of renal origin: Secondary | ICD-10-CM | POA: Diagnosis not present

## 2018-04-17 DIAGNOSIS — Z992 Dependence on renal dialysis: Secondary | ICD-10-CM | POA: Diagnosis not present

## 2018-04-17 DIAGNOSIS — N186 End stage renal disease: Secondary | ICD-10-CM | POA: Diagnosis not present

## 2018-04-17 DIAGNOSIS — D509 Iron deficiency anemia, unspecified: Secondary | ICD-10-CM | POA: Diagnosis not present

## 2018-04-17 DIAGNOSIS — D631 Anemia in chronic kidney disease: Secondary | ICD-10-CM | POA: Diagnosis not present

## 2018-04-19 ENCOUNTER — Encounter: Payer: Self-pay | Admitting: Family Medicine

## 2018-04-19 DIAGNOSIS — D631 Anemia in chronic kidney disease: Secondary | ICD-10-CM | POA: Diagnosis not present

## 2018-04-19 DIAGNOSIS — N186 End stage renal disease: Secondary | ICD-10-CM | POA: Diagnosis not present

## 2018-04-19 DIAGNOSIS — N2581 Secondary hyperparathyroidism of renal origin: Secondary | ICD-10-CM | POA: Diagnosis not present

## 2018-04-19 DIAGNOSIS — Z992 Dependence on renal dialysis: Secondary | ICD-10-CM | POA: Diagnosis not present

## 2018-04-19 DIAGNOSIS — D509 Iron deficiency anemia, unspecified: Secondary | ICD-10-CM | POA: Diagnosis not present

## 2018-04-21 DIAGNOSIS — D631 Anemia in chronic kidney disease: Secondary | ICD-10-CM | POA: Diagnosis not present

## 2018-04-21 DIAGNOSIS — N2581 Secondary hyperparathyroidism of renal origin: Secondary | ICD-10-CM | POA: Diagnosis not present

## 2018-04-21 DIAGNOSIS — N186 End stage renal disease: Secondary | ICD-10-CM | POA: Diagnosis not present

## 2018-04-21 DIAGNOSIS — Z992 Dependence on renal dialysis: Secondary | ICD-10-CM | POA: Diagnosis not present

## 2018-04-21 DIAGNOSIS — D509 Iron deficiency anemia, unspecified: Secondary | ICD-10-CM | POA: Diagnosis not present

## 2018-04-24 DIAGNOSIS — D631 Anemia in chronic kidney disease: Secondary | ICD-10-CM | POA: Diagnosis not present

## 2018-04-24 DIAGNOSIS — N2581 Secondary hyperparathyroidism of renal origin: Secondary | ICD-10-CM | POA: Diagnosis not present

## 2018-04-24 DIAGNOSIS — Z992 Dependence on renal dialysis: Secondary | ICD-10-CM | POA: Diagnosis not present

## 2018-04-24 DIAGNOSIS — D509 Iron deficiency anemia, unspecified: Secondary | ICD-10-CM | POA: Diagnosis not present

## 2018-04-24 DIAGNOSIS — N186 End stage renal disease: Secondary | ICD-10-CM | POA: Diagnosis not present

## 2018-04-26 DIAGNOSIS — D509 Iron deficiency anemia, unspecified: Secondary | ICD-10-CM | POA: Diagnosis not present

## 2018-04-26 DIAGNOSIS — N186 End stage renal disease: Secondary | ICD-10-CM | POA: Diagnosis not present

## 2018-04-26 DIAGNOSIS — D631 Anemia in chronic kidney disease: Secondary | ICD-10-CM | POA: Diagnosis not present

## 2018-04-26 DIAGNOSIS — N2581 Secondary hyperparathyroidism of renal origin: Secondary | ICD-10-CM | POA: Diagnosis not present

## 2018-04-26 DIAGNOSIS — Z992 Dependence on renal dialysis: Secondary | ICD-10-CM | POA: Diagnosis not present

## 2018-04-28 DIAGNOSIS — N2581 Secondary hyperparathyroidism of renal origin: Secondary | ICD-10-CM | POA: Diagnosis not present

## 2018-04-28 DIAGNOSIS — D631 Anemia in chronic kidney disease: Secondary | ICD-10-CM | POA: Diagnosis not present

## 2018-04-28 DIAGNOSIS — N186 End stage renal disease: Secondary | ICD-10-CM | POA: Diagnosis not present

## 2018-04-28 DIAGNOSIS — D509 Iron deficiency anemia, unspecified: Secondary | ICD-10-CM | POA: Diagnosis not present

## 2018-04-28 DIAGNOSIS — Z992 Dependence on renal dialysis: Secondary | ICD-10-CM | POA: Diagnosis not present

## 2018-05-01 DIAGNOSIS — N2581 Secondary hyperparathyroidism of renal origin: Secondary | ICD-10-CM | POA: Diagnosis not present

## 2018-05-01 DIAGNOSIS — D509 Iron deficiency anemia, unspecified: Secondary | ICD-10-CM | POA: Diagnosis not present

## 2018-05-01 DIAGNOSIS — N186 End stage renal disease: Secondary | ICD-10-CM | POA: Diagnosis not present

## 2018-05-01 DIAGNOSIS — Z992 Dependence on renal dialysis: Secondary | ICD-10-CM | POA: Diagnosis not present

## 2018-05-01 DIAGNOSIS — D631 Anemia in chronic kidney disease: Secondary | ICD-10-CM | POA: Diagnosis not present

## 2018-05-03 DIAGNOSIS — D509 Iron deficiency anemia, unspecified: Secondary | ICD-10-CM | POA: Diagnosis not present

## 2018-05-03 DIAGNOSIS — N186 End stage renal disease: Secondary | ICD-10-CM | POA: Diagnosis not present

## 2018-05-03 DIAGNOSIS — N2581 Secondary hyperparathyroidism of renal origin: Secondary | ICD-10-CM | POA: Diagnosis not present

## 2018-05-03 DIAGNOSIS — Z992 Dependence on renal dialysis: Secondary | ICD-10-CM | POA: Diagnosis not present

## 2018-05-03 DIAGNOSIS — D631 Anemia in chronic kidney disease: Secondary | ICD-10-CM | POA: Diagnosis not present

## 2018-05-05 DIAGNOSIS — Z992 Dependence on renal dialysis: Secondary | ICD-10-CM | POA: Diagnosis not present

## 2018-05-05 DIAGNOSIS — D509 Iron deficiency anemia, unspecified: Secondary | ICD-10-CM | POA: Diagnosis not present

## 2018-05-05 DIAGNOSIS — N2581 Secondary hyperparathyroidism of renal origin: Secondary | ICD-10-CM | POA: Diagnosis not present

## 2018-05-05 DIAGNOSIS — N186 End stage renal disease: Secondary | ICD-10-CM | POA: Diagnosis not present

## 2018-05-05 DIAGNOSIS — D631 Anemia in chronic kidney disease: Secondary | ICD-10-CM | POA: Diagnosis not present

## 2018-05-08 DIAGNOSIS — D631 Anemia in chronic kidney disease: Secondary | ICD-10-CM | POA: Diagnosis not present

## 2018-05-08 DIAGNOSIS — Z992 Dependence on renal dialysis: Secondary | ICD-10-CM | POA: Diagnosis not present

## 2018-05-08 DIAGNOSIS — N2581 Secondary hyperparathyroidism of renal origin: Secondary | ICD-10-CM | POA: Diagnosis not present

## 2018-05-08 DIAGNOSIS — D509 Iron deficiency anemia, unspecified: Secondary | ICD-10-CM | POA: Diagnosis not present

## 2018-05-08 DIAGNOSIS — N186 End stage renal disease: Secondary | ICD-10-CM | POA: Diagnosis not present

## 2018-05-10 DIAGNOSIS — N2581 Secondary hyperparathyroidism of renal origin: Secondary | ICD-10-CM | POA: Diagnosis not present

## 2018-05-10 DIAGNOSIS — D509 Iron deficiency anemia, unspecified: Secondary | ICD-10-CM | POA: Diagnosis not present

## 2018-05-10 DIAGNOSIS — N186 End stage renal disease: Secondary | ICD-10-CM | POA: Diagnosis not present

## 2018-05-10 DIAGNOSIS — Z992 Dependence on renal dialysis: Secondary | ICD-10-CM | POA: Diagnosis not present

## 2018-05-10 DIAGNOSIS — D631 Anemia in chronic kidney disease: Secondary | ICD-10-CM | POA: Diagnosis not present

## 2018-05-12 DIAGNOSIS — N186 End stage renal disease: Secondary | ICD-10-CM | POA: Diagnosis not present

## 2018-05-12 DIAGNOSIS — N2581 Secondary hyperparathyroidism of renal origin: Secondary | ICD-10-CM | POA: Diagnosis not present

## 2018-05-12 DIAGNOSIS — D509 Iron deficiency anemia, unspecified: Secondary | ICD-10-CM | POA: Diagnosis not present

## 2018-05-12 DIAGNOSIS — D631 Anemia in chronic kidney disease: Secondary | ICD-10-CM | POA: Diagnosis not present

## 2018-05-12 DIAGNOSIS — Z992 Dependence on renal dialysis: Secondary | ICD-10-CM | POA: Diagnosis not present

## 2018-05-14 ENCOUNTER — Other Ambulatory Visit: Payer: Self-pay

## 2018-05-14 ENCOUNTER — Telehealth: Payer: Self-pay | Admitting: Family Medicine

## 2018-05-14 MED ORDER — FENOFIBRATE 160 MG PO TABS: 160.0000 mg | ORAL_TABLET | Freq: Every day | ORAL | 1 refills | Status: DC

## 2018-05-14 MED ORDER — GABAPENTIN 300 MG PO CAPS: 300.0000 mg | ORAL_CAPSULE | Freq: Three times a day (TID) | ORAL | 1 refills | Status: DC

## 2018-05-14 NOTE — Telephone Encounter (Signed)
Copied from [place] [id number]. Topic: Quick Communication - Rx Refill/Question >> May 14, 2018 10:23 AM Richardo Priest, NT wrote: Medication:  gabapentin (NEURONTIN) 300 MG capsule fenofibrate 160 MG tablet   Has the patient contacted their pharmacy? Patient states he called pharmacy and they stated they no longer have refills on it. Patient requesting a 90 day supply.   Preferred Pharmacy (with phone number or street name):  [place], [place] [address] [phone] (Phone) [phone] (Fax)  Agent: Please be advised that RX refills may take up to 3 business days. We ask that you follow-up with your pharmacy.

## 2018-05-14 NOTE — Telephone Encounter (Signed)
Both prescriptions have been sent to the [facility] in [place].

## 2018-05-15 DIAGNOSIS — D631 Anemia in chronic kidney disease: Secondary | ICD-10-CM | POA: Diagnosis not present

## 2018-05-15 DIAGNOSIS — Z992 Dependence on renal dialysis: Secondary | ICD-10-CM | POA: Diagnosis not present

## 2018-05-15 DIAGNOSIS — D509 Iron deficiency anemia, unspecified: Secondary | ICD-10-CM | POA: Diagnosis not present

## 2018-05-15 DIAGNOSIS — N2581 Secondary hyperparathyroidism of renal origin: Secondary | ICD-10-CM | POA: Diagnosis not present

## 2018-05-15 DIAGNOSIS — N186 End stage renal disease: Secondary | ICD-10-CM | POA: Diagnosis not present

## 2018-05-17 DIAGNOSIS — Z992 Dependence on renal dialysis: Secondary | ICD-10-CM | POA: Diagnosis not present

## 2018-05-17 DIAGNOSIS — D631 Anemia in chronic kidney disease: Secondary | ICD-10-CM | POA: Diagnosis not present

## 2018-05-17 DIAGNOSIS — N2581 Secondary hyperparathyroidism of renal origin: Secondary | ICD-10-CM | POA: Diagnosis not present

## 2018-05-17 DIAGNOSIS — D509 Iron deficiency anemia, unspecified: Secondary | ICD-10-CM | POA: Diagnosis not present

## 2018-05-17 DIAGNOSIS — N186 End stage renal disease: Secondary | ICD-10-CM | POA: Diagnosis not present

## 2018-05-18 ENCOUNTER — Ambulatory Visit: Payer: Medicare Other | Admitting: Family Medicine

## 2018-05-19 DIAGNOSIS — Z992 Dependence on renal dialysis: Secondary | ICD-10-CM | POA: Diagnosis not present

## 2018-05-19 DIAGNOSIS — D631 Anemia in chronic kidney disease: Secondary | ICD-10-CM | POA: Diagnosis not present

## 2018-05-19 DIAGNOSIS — D509 Iron deficiency anemia, unspecified: Secondary | ICD-10-CM | POA: Diagnosis not present

## 2018-05-19 DIAGNOSIS — N2581 Secondary hyperparathyroidism of renal origin: Secondary | ICD-10-CM | POA: Diagnosis not present

## 2018-05-19 DIAGNOSIS — N186 End stage renal disease: Secondary | ICD-10-CM | POA: Diagnosis not present

## 2018-05-22 DIAGNOSIS — D631 Anemia in chronic kidney disease: Secondary | ICD-10-CM | POA: Diagnosis not present

## 2018-05-22 DIAGNOSIS — Z992 Dependence on renal dialysis: Secondary | ICD-10-CM | POA: Diagnosis not present

## 2018-05-22 DIAGNOSIS — N186 End stage renal disease: Secondary | ICD-10-CM | POA: Diagnosis not present

## 2018-05-22 DIAGNOSIS — D509 Iron deficiency anemia, unspecified: Secondary | ICD-10-CM | POA: Diagnosis not present

## 2018-05-22 DIAGNOSIS — N2581 Secondary hyperparathyroidism of renal origin: Secondary | ICD-10-CM | POA: Diagnosis not present

## 2018-05-24 DIAGNOSIS — N186 End stage renal disease: Secondary | ICD-10-CM | POA: Diagnosis not present

## 2018-05-24 DIAGNOSIS — N2581 Secondary hyperparathyroidism of renal origin: Secondary | ICD-10-CM | POA: Diagnosis not present

## 2018-05-24 DIAGNOSIS — Z992 Dependence on renal dialysis: Secondary | ICD-10-CM | POA: Diagnosis not present

## 2018-05-24 DIAGNOSIS — D509 Iron deficiency anemia, unspecified: Secondary | ICD-10-CM | POA: Diagnosis not present

## 2018-05-24 DIAGNOSIS — D631 Anemia in chronic kidney disease: Secondary | ICD-10-CM | POA: Diagnosis not present

## 2018-05-26 DIAGNOSIS — D509 Iron deficiency anemia, unspecified: Secondary | ICD-10-CM | POA: Diagnosis not present

## 2018-05-26 DIAGNOSIS — D631 Anemia in chronic kidney disease: Secondary | ICD-10-CM | POA: Diagnosis not present

## 2018-05-26 DIAGNOSIS — N2581 Secondary hyperparathyroidism of renal origin: Secondary | ICD-10-CM | POA: Diagnosis not present

## 2018-05-26 DIAGNOSIS — Z992 Dependence on renal dialysis: Secondary | ICD-10-CM | POA: Diagnosis not present

## 2018-05-26 DIAGNOSIS — N186 End stage renal disease: Secondary | ICD-10-CM | POA: Diagnosis not present

## 2018-05-29 DIAGNOSIS — D509 Iron deficiency anemia, unspecified: Secondary | ICD-10-CM | POA: Diagnosis not present

## 2018-05-29 DIAGNOSIS — Z992 Dependence on renal dialysis: Secondary | ICD-10-CM | POA: Diagnosis not present

## 2018-05-29 DIAGNOSIS — D631 Anemia in chronic kidney disease: Secondary | ICD-10-CM | POA: Diagnosis not present

## 2018-05-29 DIAGNOSIS — N2581 Secondary hyperparathyroidism of renal origin: Secondary | ICD-10-CM | POA: Diagnosis not present

## 2018-05-29 DIAGNOSIS — N186 End stage renal disease: Secondary | ICD-10-CM | POA: Diagnosis not present

## 2018-05-31 DIAGNOSIS — N186 End stage renal disease: Secondary | ICD-10-CM | POA: Diagnosis not present

## 2018-05-31 DIAGNOSIS — N2581 Secondary hyperparathyroidism of renal origin: Secondary | ICD-10-CM | POA: Diagnosis not present

## 2018-05-31 DIAGNOSIS — D509 Iron deficiency anemia, unspecified: Secondary | ICD-10-CM | POA: Diagnosis not present

## 2018-05-31 DIAGNOSIS — Z992 Dependence on renal dialysis: Secondary | ICD-10-CM | POA: Diagnosis not present

## 2018-05-31 DIAGNOSIS — D631 Anemia in chronic kidney disease: Secondary | ICD-10-CM | POA: Diagnosis not present

## 2018-06-02 DIAGNOSIS — N2581 Secondary hyperparathyroidism of renal origin: Secondary | ICD-10-CM | POA: Diagnosis not present

## 2018-06-02 DIAGNOSIS — D631 Anemia in chronic kidney disease: Secondary | ICD-10-CM | POA: Diagnosis not present

## 2018-06-02 DIAGNOSIS — N186 End stage renal disease: Secondary | ICD-10-CM | POA: Diagnosis not present

## 2018-06-02 DIAGNOSIS — Z992 Dependence on renal dialysis: Secondary | ICD-10-CM | POA: Diagnosis not present

## 2018-06-02 DIAGNOSIS — D509 Iron deficiency anemia, unspecified: Secondary | ICD-10-CM | POA: Diagnosis not present

## 2018-06-05 DIAGNOSIS — D631 Anemia in chronic kidney disease: Secondary | ICD-10-CM | POA: Diagnosis not present

## 2018-06-05 DIAGNOSIS — D509 Iron deficiency anemia, unspecified: Secondary | ICD-10-CM | POA: Diagnosis not present

## 2018-06-05 DIAGNOSIS — N2581 Secondary hyperparathyroidism of renal origin: Secondary | ICD-10-CM | POA: Diagnosis not present

## 2018-06-05 DIAGNOSIS — Z992 Dependence on renal dialysis: Secondary | ICD-10-CM | POA: Diagnosis not present

## 2018-06-05 DIAGNOSIS — N186 End stage renal disease: Secondary | ICD-10-CM | POA: Diagnosis not present

## 2018-06-07 DIAGNOSIS — D509 Iron deficiency anemia, unspecified: Secondary | ICD-10-CM | POA: Diagnosis not present

## 2018-06-07 DIAGNOSIS — Z992 Dependence on renal dialysis: Secondary | ICD-10-CM | POA: Diagnosis not present

## 2018-06-07 DIAGNOSIS — N186 End stage renal disease: Secondary | ICD-10-CM | POA: Diagnosis not present

## 2018-06-07 DIAGNOSIS — N2581 Secondary hyperparathyroidism of renal origin: Secondary | ICD-10-CM | POA: Diagnosis not present

## 2018-06-07 DIAGNOSIS — D631 Anemia in chronic kidney disease: Secondary | ICD-10-CM | POA: Diagnosis not present

## 2018-06-09 DIAGNOSIS — D509 Iron deficiency anemia, unspecified: Secondary | ICD-10-CM | POA: Diagnosis not present

## 2018-06-09 DIAGNOSIS — N186 End stage renal disease: Secondary | ICD-10-CM | POA: Diagnosis not present

## 2018-06-09 DIAGNOSIS — Z992 Dependence on renal dialysis: Secondary | ICD-10-CM | POA: Diagnosis not present

## 2018-06-09 DIAGNOSIS — D631 Anemia in chronic kidney disease: Secondary | ICD-10-CM | POA: Diagnosis not present

## 2018-06-09 DIAGNOSIS — N2581 Secondary hyperparathyroidism of renal origin: Secondary | ICD-10-CM | POA: Diagnosis not present

## 2018-06-10 DIAGNOSIS — N186 End stage renal disease: Secondary | ICD-10-CM | POA: Diagnosis not present

## 2018-06-10 DIAGNOSIS — Z992 Dependence on renal dialysis: Secondary | ICD-10-CM | POA: Diagnosis not present

## 2018-06-11 ENCOUNTER — Ambulatory Visit: Payer: Medicare Other | Admitting: Orthopedic Surgery

## 2018-06-11 DIAGNOSIS — H18413 Arcus senilis, bilateral: Secondary | ICD-10-CM | POA: Diagnosis not present

## 2018-06-11 DIAGNOSIS — H5213 Myopia, bilateral: Secondary | ICD-10-CM | POA: Diagnosis not present

## 2018-06-11 DIAGNOSIS — E119 Type 2 diabetes mellitus without complications: Secondary | ICD-10-CM | POA: Diagnosis not present

## 2018-06-11 DIAGNOSIS — H25813 Combined forms of age-related cataract, bilateral: Secondary | ICD-10-CM | POA: Diagnosis not present

## 2018-06-12 DIAGNOSIS — D509 Iron deficiency anemia, unspecified: Secondary | ICD-10-CM | POA: Diagnosis not present

## 2018-06-12 DIAGNOSIS — D631 Anemia in chronic kidney disease: Secondary | ICD-10-CM | POA: Diagnosis not present

## 2018-06-12 DIAGNOSIS — N2581 Secondary hyperparathyroidism of renal origin: Secondary | ICD-10-CM | POA: Diagnosis not present

## 2018-06-12 DIAGNOSIS — N186 End stage renal disease: Secondary | ICD-10-CM | POA: Diagnosis not present

## 2018-06-12 DIAGNOSIS — Z992 Dependence on renal dialysis: Secondary | ICD-10-CM | POA: Diagnosis not present

## 2018-06-14 DIAGNOSIS — Z992 Dependence on renal dialysis: Secondary | ICD-10-CM | POA: Diagnosis not present

## 2018-06-14 DIAGNOSIS — D509 Iron deficiency anemia, unspecified: Secondary | ICD-10-CM | POA: Diagnosis not present

## 2018-06-14 DIAGNOSIS — D631 Anemia in chronic kidney disease: Secondary | ICD-10-CM | POA: Diagnosis not present

## 2018-06-14 DIAGNOSIS — N2581 Secondary hyperparathyroidism of renal origin: Secondary | ICD-10-CM | POA: Diagnosis not present

## 2018-06-14 DIAGNOSIS — N186 End stage renal disease: Secondary | ICD-10-CM | POA: Diagnosis not present

## 2018-06-16 DIAGNOSIS — N186 End stage renal disease: Secondary | ICD-10-CM | POA: Diagnosis not present

## 2018-06-16 DIAGNOSIS — Z992 Dependence on renal dialysis: Secondary | ICD-10-CM | POA: Diagnosis not present

## 2018-06-16 DIAGNOSIS — D509 Iron deficiency anemia, unspecified: Secondary | ICD-10-CM | POA: Diagnosis not present

## 2018-06-16 DIAGNOSIS — D631 Anemia in chronic kidney disease: Secondary | ICD-10-CM | POA: Diagnosis not present

## 2018-06-16 DIAGNOSIS — N2581 Secondary hyperparathyroidism of renal origin: Secondary | ICD-10-CM | POA: Diagnosis not present

## 2018-06-19 DIAGNOSIS — D509 Iron deficiency anemia, unspecified: Secondary | ICD-10-CM | POA: Diagnosis not present

## 2018-06-19 DIAGNOSIS — N186 End stage renal disease: Secondary | ICD-10-CM | POA: Diagnosis not present

## 2018-06-19 DIAGNOSIS — N2581 Secondary hyperparathyroidism of renal origin: Secondary | ICD-10-CM | POA: Diagnosis not present

## 2018-06-19 DIAGNOSIS — D631 Anemia in chronic kidney disease: Secondary | ICD-10-CM | POA: Diagnosis not present

## 2018-06-19 DIAGNOSIS — Z992 Dependence on renal dialysis: Secondary | ICD-10-CM | POA: Diagnosis not present

## 2018-06-21 DIAGNOSIS — N186 End stage renal disease: Secondary | ICD-10-CM | POA: Diagnosis not present

## 2018-06-21 DIAGNOSIS — Z992 Dependence on renal dialysis: Secondary | ICD-10-CM | POA: Diagnosis not present

## 2018-06-21 DIAGNOSIS — D631 Anemia in chronic kidney disease: Secondary | ICD-10-CM | POA: Diagnosis not present

## 2018-06-21 DIAGNOSIS — N2581 Secondary hyperparathyroidism of renal origin: Secondary | ICD-10-CM | POA: Diagnosis not present

## 2018-06-21 DIAGNOSIS — D509 Iron deficiency anemia, unspecified: Secondary | ICD-10-CM | POA: Diagnosis not present

## 2018-06-23 DIAGNOSIS — Z992 Dependence on renal dialysis: Secondary | ICD-10-CM | POA: Diagnosis not present

## 2018-06-23 DIAGNOSIS — D631 Anemia in chronic kidney disease: Secondary | ICD-10-CM | POA: Diagnosis not present

## 2018-06-23 DIAGNOSIS — N2581 Secondary hyperparathyroidism of renal origin: Secondary | ICD-10-CM | POA: Diagnosis not present

## 2018-06-23 DIAGNOSIS — N186 End stage renal disease: Secondary | ICD-10-CM | POA: Diagnosis not present

## 2018-06-23 DIAGNOSIS — D509 Iron deficiency anemia, unspecified: Secondary | ICD-10-CM | POA: Diagnosis not present

## 2018-06-26 DIAGNOSIS — N2581 Secondary hyperparathyroidism of renal origin: Secondary | ICD-10-CM | POA: Diagnosis not present

## 2018-06-26 DIAGNOSIS — D509 Iron deficiency anemia, unspecified: Secondary | ICD-10-CM | POA: Diagnosis not present

## 2018-06-26 DIAGNOSIS — Z992 Dependence on renal dialysis: Secondary | ICD-10-CM | POA: Diagnosis not present

## 2018-06-26 DIAGNOSIS — N186 End stage renal disease: Secondary | ICD-10-CM | POA: Diagnosis not present

## 2018-06-26 DIAGNOSIS — D631 Anemia in chronic kidney disease: Secondary | ICD-10-CM | POA: Diagnosis not present

## 2018-06-28 DIAGNOSIS — N186 End stage renal disease: Secondary | ICD-10-CM | POA: Diagnosis not present

## 2018-06-28 DIAGNOSIS — N2581 Secondary hyperparathyroidism of renal origin: Secondary | ICD-10-CM | POA: Diagnosis not present

## 2018-06-28 DIAGNOSIS — D631 Anemia in chronic kidney disease: Secondary | ICD-10-CM | POA: Diagnosis not present

## 2018-06-28 DIAGNOSIS — D509 Iron deficiency anemia, unspecified: Secondary | ICD-10-CM | POA: Diagnosis not present

## 2018-06-28 DIAGNOSIS — Z992 Dependence on renal dialysis: Secondary | ICD-10-CM | POA: Diagnosis not present

## 2018-06-30 DIAGNOSIS — N186 End stage renal disease: Secondary | ICD-10-CM | POA: Diagnosis not present

## 2018-06-30 DIAGNOSIS — N2581 Secondary hyperparathyroidism of renal origin: Secondary | ICD-10-CM | POA: Diagnosis not present

## 2018-06-30 DIAGNOSIS — Z992 Dependence on renal dialysis: Secondary | ICD-10-CM | POA: Diagnosis not present

## 2018-06-30 DIAGNOSIS — D509 Iron deficiency anemia, unspecified: Secondary | ICD-10-CM | POA: Diagnosis not present

## 2018-06-30 DIAGNOSIS — D631 Anemia in chronic kidney disease: Secondary | ICD-10-CM | POA: Diagnosis not present

## 2018-07-03 DIAGNOSIS — D509 Iron deficiency anemia, unspecified: Secondary | ICD-10-CM | POA: Diagnosis not present

## 2018-07-03 DIAGNOSIS — N2581 Secondary hyperparathyroidism of renal origin: Secondary | ICD-10-CM | POA: Diagnosis not present

## 2018-07-03 DIAGNOSIS — D631 Anemia in chronic kidney disease: Secondary | ICD-10-CM | POA: Diagnosis not present

## 2018-07-03 DIAGNOSIS — Z992 Dependence on renal dialysis: Secondary | ICD-10-CM | POA: Diagnosis not present

## 2018-07-03 DIAGNOSIS — Z794 Long term (current) use of insulin: Secondary | ICD-10-CM | POA: Diagnosis not present

## 2018-07-03 DIAGNOSIS — N186 End stage renal disease: Secondary | ICD-10-CM | POA: Diagnosis not present

## 2018-07-03 DIAGNOSIS — E119 Type 2 diabetes mellitus without complications: Secondary | ICD-10-CM | POA: Diagnosis not present

## 2018-07-04 ENCOUNTER — Other Ambulatory Visit: Payer: Self-pay | Admitting: Family Medicine

## 2018-07-05 DIAGNOSIS — Z992 Dependence on renal dialysis: Secondary | ICD-10-CM | POA: Diagnosis not present

## 2018-07-05 DIAGNOSIS — D509 Iron deficiency anemia, unspecified: Secondary | ICD-10-CM | POA: Diagnosis not present

## 2018-07-05 DIAGNOSIS — D631 Anemia in chronic kidney disease: Secondary | ICD-10-CM | POA: Diagnosis not present

## 2018-07-05 DIAGNOSIS — N2581 Secondary hyperparathyroidism of renal origin: Secondary | ICD-10-CM | POA: Diagnosis not present

## 2018-07-05 DIAGNOSIS — N186 End stage renal disease: Secondary | ICD-10-CM | POA: Diagnosis not present

## 2018-07-07 DIAGNOSIS — Z992 Dependence on renal dialysis: Secondary | ICD-10-CM | POA: Diagnosis not present

## 2018-07-07 DIAGNOSIS — N2581 Secondary hyperparathyroidism of renal origin: Secondary | ICD-10-CM | POA: Diagnosis not present

## 2018-07-07 DIAGNOSIS — N186 End stage renal disease: Secondary | ICD-10-CM | POA: Diagnosis not present

## 2018-07-07 DIAGNOSIS — D631 Anemia in chronic kidney disease: Secondary | ICD-10-CM | POA: Diagnosis not present

## 2018-07-07 DIAGNOSIS — D509 Iron deficiency anemia, unspecified: Secondary | ICD-10-CM | POA: Diagnosis not present

## 2018-07-09 ENCOUNTER — Ambulatory Visit: Payer: Medicare Other | Admitting: Family Medicine

## 2018-07-09 ENCOUNTER — Other Ambulatory Visit: Payer: Self-pay

## 2018-07-10 DIAGNOSIS — D509 Iron deficiency anemia, unspecified: Secondary | ICD-10-CM | POA: Diagnosis not present

## 2018-07-10 DIAGNOSIS — D631 Anemia in chronic kidney disease: Secondary | ICD-10-CM | POA: Diagnosis not present

## 2018-07-10 DIAGNOSIS — N186 End stage renal disease: Secondary | ICD-10-CM | POA: Diagnosis not present

## 2018-07-10 DIAGNOSIS — N2581 Secondary hyperparathyroidism of renal origin: Secondary | ICD-10-CM | POA: Diagnosis not present

## 2018-07-10 DIAGNOSIS — Z992 Dependence on renal dialysis: Secondary | ICD-10-CM | POA: Diagnosis not present

## 2018-07-11 ENCOUNTER — Other Ambulatory Visit: Payer: Self-pay | Admitting: Family Medicine

## 2018-07-11 ENCOUNTER — Telehealth: Payer: Self-pay

## 2018-07-11 DIAGNOSIS — R140 Abdominal distension (gaseous): Secondary | ICD-10-CM | POA: Diagnosis not present

## 2018-07-11 DIAGNOSIS — R1010 Upper abdominal pain, unspecified: Secondary | ICD-10-CM | POA: Diagnosis not present

## 2018-07-11 DIAGNOSIS — Z5321 Procedure and treatment not carried out due to patient leaving prior to being seen by health care provider: Secondary | ICD-10-CM | POA: Diagnosis not present

## 2018-07-11 NOTE — Telephone Encounter (Signed)
Patient needs to reschedule his appointment that he missed on 07/09/18

## 2018-07-11 NOTE — Telephone Encounter (Signed)
Called patient to let him know that he missed his appointment on June 29th and we need to reschedule this appointment for a telephone or Doxy if he prefers and to call us back to schedule.  OK for PEC to discuss/advise.  CRM Created.

## 2018-07-12 DIAGNOSIS — D631 Anemia in chronic kidney disease: Secondary | ICD-10-CM | POA: Diagnosis not present

## 2018-07-12 DIAGNOSIS — D509 Iron deficiency anemia, unspecified: Secondary | ICD-10-CM | POA: Diagnosis not present

## 2018-07-12 DIAGNOSIS — N186 End stage renal disease: Secondary | ICD-10-CM | POA: Diagnosis not present

## 2018-07-12 DIAGNOSIS — Z992 Dependence on renal dialysis: Secondary | ICD-10-CM | POA: Diagnosis not present

## 2018-07-12 DIAGNOSIS — N2581 Secondary hyperparathyroidism of renal origin: Secondary | ICD-10-CM | POA: Diagnosis not present

## 2018-07-14 DIAGNOSIS — N186 End stage renal disease: Secondary | ICD-10-CM | POA: Diagnosis not present

## 2018-07-14 DIAGNOSIS — Z992 Dependence on renal dialysis: Secondary | ICD-10-CM | POA: Diagnosis not present

## 2018-07-14 DIAGNOSIS — D631 Anemia in chronic kidney disease: Secondary | ICD-10-CM | POA: Diagnosis not present

## 2018-07-14 DIAGNOSIS — D509 Iron deficiency anemia, unspecified: Secondary | ICD-10-CM | POA: Diagnosis not present

## 2018-07-14 DIAGNOSIS — N2581 Secondary hyperparathyroidism of renal origin: Secondary | ICD-10-CM | POA: Diagnosis not present

## 2018-07-17 DIAGNOSIS — D631 Anemia in chronic kidney disease: Secondary | ICD-10-CM | POA: Diagnosis not present

## 2018-07-17 DIAGNOSIS — N186 End stage renal disease: Secondary | ICD-10-CM | POA: Diagnosis not present

## 2018-07-17 DIAGNOSIS — D509 Iron deficiency anemia, unspecified: Secondary | ICD-10-CM | POA: Diagnosis not present

## 2018-07-17 DIAGNOSIS — N2581 Secondary hyperparathyroidism of renal origin: Secondary | ICD-10-CM | POA: Diagnosis not present

## 2018-07-17 DIAGNOSIS — Z992 Dependence on renal dialysis: Secondary | ICD-10-CM | POA: Diagnosis not present

## 2018-07-19 DIAGNOSIS — Z992 Dependence on renal dialysis: Secondary | ICD-10-CM | POA: Diagnosis not present

## 2018-07-19 DIAGNOSIS — N2581 Secondary hyperparathyroidism of renal origin: Secondary | ICD-10-CM | POA: Diagnosis not present

## 2018-07-19 DIAGNOSIS — D631 Anemia in chronic kidney disease: Secondary | ICD-10-CM | POA: Diagnosis not present

## 2018-07-19 DIAGNOSIS — D509 Iron deficiency anemia, unspecified: Secondary | ICD-10-CM | POA: Diagnosis not present

## 2018-07-19 DIAGNOSIS — N186 End stage renal disease: Secondary | ICD-10-CM | POA: Diagnosis not present

## 2018-07-21 DIAGNOSIS — Z992 Dependence on renal dialysis: Secondary | ICD-10-CM | POA: Diagnosis not present

## 2018-07-21 DIAGNOSIS — D509 Iron deficiency anemia, unspecified: Secondary | ICD-10-CM | POA: Diagnosis not present

## 2018-07-21 DIAGNOSIS — N2581 Secondary hyperparathyroidism of renal origin: Secondary | ICD-10-CM | POA: Diagnosis not present

## 2018-07-21 DIAGNOSIS — N186 End stage renal disease: Secondary | ICD-10-CM | POA: Diagnosis not present

## 2018-07-21 DIAGNOSIS — D631 Anemia in chronic kidney disease: Secondary | ICD-10-CM | POA: Diagnosis not present

## 2018-07-24 DIAGNOSIS — N2581 Secondary hyperparathyroidism of renal origin: Secondary | ICD-10-CM | POA: Diagnosis not present

## 2018-07-24 DIAGNOSIS — N186 End stage renal disease: Secondary | ICD-10-CM | POA: Diagnosis not present

## 2018-07-24 DIAGNOSIS — D509 Iron deficiency anemia, unspecified: Secondary | ICD-10-CM | POA: Diagnosis not present

## 2018-07-24 DIAGNOSIS — Z992 Dependence on renal dialysis: Secondary | ICD-10-CM | POA: Diagnosis not present

## 2018-07-24 DIAGNOSIS — D631 Anemia in chronic kidney disease: Secondary | ICD-10-CM | POA: Diagnosis not present

## 2018-07-26 DIAGNOSIS — N186 End stage renal disease: Secondary | ICD-10-CM | POA: Diagnosis not present

## 2018-07-26 DIAGNOSIS — D631 Anemia in chronic kidney disease: Secondary | ICD-10-CM | POA: Diagnosis not present

## 2018-07-26 DIAGNOSIS — Z992 Dependence on renal dialysis: Secondary | ICD-10-CM | POA: Diagnosis not present

## 2018-07-26 DIAGNOSIS — D509 Iron deficiency anemia, unspecified: Secondary | ICD-10-CM | POA: Diagnosis not present

## 2018-07-26 DIAGNOSIS — N2581 Secondary hyperparathyroidism of renal origin: Secondary | ICD-10-CM | POA: Diagnosis not present

## 2018-07-28 DIAGNOSIS — D509 Iron deficiency anemia, unspecified: Secondary | ICD-10-CM | POA: Diagnosis not present

## 2018-07-28 DIAGNOSIS — N2581 Secondary hyperparathyroidism of renal origin: Secondary | ICD-10-CM | POA: Diagnosis not present

## 2018-07-28 DIAGNOSIS — D631 Anemia in chronic kidney disease: Secondary | ICD-10-CM | POA: Diagnosis not present

## 2018-07-28 DIAGNOSIS — Z992 Dependence on renal dialysis: Secondary | ICD-10-CM | POA: Diagnosis not present

## 2018-07-28 DIAGNOSIS — N186 End stage renal disease: Secondary | ICD-10-CM | POA: Diagnosis not present

## 2018-07-30 ENCOUNTER — Ambulatory Visit (INDEPENDENT_AMBULATORY_CARE_PROVIDER_SITE_OTHER): Payer: Medicare Other | Admitting: Family Medicine

## 2018-07-30 ENCOUNTER — Other Ambulatory Visit: Payer: Self-pay

## 2018-07-30 ENCOUNTER — Telehealth: Payer: Self-pay

## 2018-07-30 DIAGNOSIS — E1129 Type 2 diabetes mellitus with other diabetic kidney complication: Secondary | ICD-10-CM

## 2018-07-30 DIAGNOSIS — E1165 Type 2 diabetes mellitus with hyperglycemia: Secondary | ICD-10-CM | POA: Diagnosis not present

## 2018-07-30 DIAGNOSIS — H25811 Combined forms of age-related cataract, right eye: Secondary | ICD-10-CM | POA: Diagnosis not present

## 2018-07-30 NOTE — Progress Notes (Signed)
Patient ID: Anthony Bullock, male   DOB: 12-08-1960, 58 y.o.   MRN: [id number]  This visit type was conducted due to national recommendations for restrictions regarding the COVID-19 pandemic in an effort to limit this patient's exposure and mitigate transmission in our community.   Virtual Visit via Telephone Note  I connected with Anthony Bullock on 07/30/18 at 10:30 AM EDT by telephone and verified that I am speaking with the correct person using two identifiers.   I discussed the limitations, risks, security and privacy concerns of performing an evaluation and management service by telephone and the availability of in person appointments. I also discussed with the patient that there may be a patient responsible charge related to this service. The patient expressed understanding and agreed to proceed.  Location patient: home Location provider: work or home office Participants present for the call: patient, provider Patient did not have a visit in the prior 7 days to address this/these issue(s).   History of Present Illness: Patient has chronic problems including renal failure and on dialysis, hypertension, systolic heart failure, CAD, COPD, poorly controlled type 2 diabetes.  He is on dialysis.  Blood sugars been very poorly controlled.  His last A1c through nephrology was 6/23 and came back 9.2% which is an improvement for him.  He is taking Basaglar 70 units daily but unfortunately only using his Humalog about once per day.  He states he gets tired of checking his blood sugars.  He is on sliding scale for Humalog but again only taking once per day instead of 3 times per day.  Denies any recent hypoglycemia   Observations/Objective: Patient sounds cheerful and well on the phone. I do not appreciate any SOB. Speech and thought processing are grossly intact. Patient reported vitals:  Assessment and Plan: Type 2 diabetes with renal complications.  Poorly controlled with long history of poor  compliance  -Stressed importance of increasing his Humalog to 3 times daily. -He states he has been reluctant to check his sugars this often.  We did mention possible freestyle libre monitoring system if he can get this covered.  He will check on insurance coverage. -I have highly advised him trying to increase frequency of Humalog use which should bring him down to a better range -Recommend repeat A1c in 3 months  Follow Up Instructions:  -As above   [id number] [id number] [id number] 11-20 [id number] 21-30 I did not refer this patient for an OV in the next 24 hours for this/these issue(s).  I discussed the assessment and treatment plan with the patient. The patient was provided an opportunity to ask questions and all were answered. The patient agreed with the plan and demonstrated an understanding of the instructions.   The patient was advised to call back or seek an in-person evaluation if the symptoms worsen or if the condition fails to improve as anticipated.  I provided 18 minutes of non-face-to-face time during this encounter.   Carolann Littler, MD

## 2018-07-30 NOTE — Telephone Encounter (Signed)
Copied from [place] [id number]. Topic: General - Inquiry >> Jul 30, 2018  1:04 PM Scherrie Gerlach wrote: Reason for CRM: pt checked with his insurance and they WILL cover the freestyle Libre dm system.  Pt would like to proceed with this meter

## 2018-07-31 ENCOUNTER — Other Ambulatory Visit: Payer: Self-pay

## 2018-07-31 DIAGNOSIS — D509 Iron deficiency anemia, unspecified: Secondary | ICD-10-CM | POA: Diagnosis not present

## 2018-07-31 DIAGNOSIS — Z992 Dependence on renal dialysis: Secondary | ICD-10-CM | POA: Diagnosis not present

## 2018-07-31 DIAGNOSIS — D631 Anemia in chronic kidney disease: Secondary | ICD-10-CM | POA: Diagnosis not present

## 2018-07-31 DIAGNOSIS — N2581 Secondary hyperparathyroidism of renal origin: Secondary | ICD-10-CM | POA: Diagnosis not present

## 2018-07-31 DIAGNOSIS — N186 End stage renal disease: Secondary | ICD-10-CM | POA: Diagnosis not present

## 2018-07-31 MED ORDER — FREESTYLE LIBRE 14 DAY SENSOR MISC: 1.0000 | 3 refills | Status: DC

## 2018-07-31 MED ORDER — FREESTYLE LIBRE 14 DAY READER DEVI: 1.0000 | Freq: Every day | 1 refills | Status: DC

## 2018-07-31 NOTE — Telephone Encounter (Signed)
Yes.  I think he will do much better with that.

## 2018-07-31 NOTE — Telephone Encounter (Signed)
Device and sensors have been sent to the [facility] for the patient.

## 2018-07-31 NOTE — Telephone Encounter (Signed)
OK to order this for patient?

## 2018-08-02 ENCOUNTER — Other Ambulatory Visit (HOSPITAL_COMMUNITY): Payer: Medicare Other

## 2018-08-02 DIAGNOSIS — D631 Anemia in chronic kidney disease: Secondary | ICD-10-CM | POA: Diagnosis not present

## 2018-08-02 DIAGNOSIS — N2581 Secondary hyperparathyroidism of renal origin: Secondary | ICD-10-CM | POA: Diagnosis not present

## 2018-08-02 DIAGNOSIS — N186 End stage renal disease: Secondary | ICD-10-CM | POA: Diagnosis not present

## 2018-08-02 DIAGNOSIS — D509 Iron deficiency anemia, unspecified: Secondary | ICD-10-CM | POA: Diagnosis not present

## 2018-08-02 DIAGNOSIS — Z992 Dependence on renal dialysis: Secondary | ICD-10-CM | POA: Diagnosis not present

## 2018-08-02 NOTE — Patient Instructions (Signed)
Your procedure is scheduled on:  08/06/2018               Report to [facility] at  7:45   AM.  Call this number if you have problems the morning of surgery: [phone]   Remember:   Do not eat or drink :After Midnight.    Take these medicines the morning of surgery with A SIP OF WATER: Coreg, Gabapentin. Allergra, and albuterol if needed           Do not wear jewelry, make-up or nail polish.  Do not wear lotions, powders, or perfumes. You may wear deodorant.  Do not bring valuables to the hospital.  Contacts, dentures or bridgework may not be worn into surgery.  Patients discharged the day of surgery will not be allowed to drive home.  Name and phone number of your driver.                                                                                                                                       Cataract Surgery  A cataract is a clouding of the lens of the eye. When a lens becomes cloudy, vision is reduced based on the degree and nature of the clouding. Surgery may be needed to improve vision. Surgery removes the cloudy lens and usually replaces it with a substitute lens (intraocular lens, IOL). LET YOUR EYE DOCTOR KNOW ABOUT:  Allergies to food or medicine.   Medicines taken including herbs, eyedrops, over-the-counter medicines, and creams.   Use of steroids (by mouth or creams).   Previous problems with anesthetics or numbing medicine.   History of bleeding problems or blood clots.   Previous surgery.   Other health problems, including diabetes and kidney problems.   Possibility of pregnancy, if this applies.  RISKS AND COMPLICATIONS  Infection.   Inflammation of the eyeball (endophthalmitis) that can spread to both eyes (sympathetic ophthalmia).   Poor wound healing.   If an IOL is inserted, it can later fall out of proper position. This is very uncommon.   Clouding of the part of your eye that holds an IOL in place. This is called an "after-cataract."  These are uncommon, but easily treated.  BEFORE THE PROCEDURE  Do not eat or drink anything except small amounts of water for 8 to 12 before your surgery, or as directed by your caregiver.    Unless you are told otherwise, continue any eyedrops you have been prescribed.   Talk to your primary caregiver about all other medicines that you take (both prescription and non-prescription). In some cases, you may need to stop or change medicines near the time of your surgery. This is most important if you are taking blood-thinning medicine. Do not stop medicines unless you are told to do so.   Arrange for someone to drive you to and from the procedure.   Do not put  contact lenses in either eye on the day of your surgery.  PROCEDURE There is more than one method for safely removing a cataract. Your doctor can explain the differences and help determine which is best for you. Phacoemulsification surgery is the most common form of cataract surgery.  An injection is given behind the eye or eyedrops are given to make this a painless procedure.   A small cut (incision) is made on the edge of the clear, dome-shaped surface that covers the front of the eye (cornea).   A tiny probe is painlessly inserted into the eye. This device gives off ultrasound waves that soften and break up the cloudy center of the lens. This makes it easier for the cloudy lens to be removed by suction.   An IOL may be implanted.   The normal lens of the eye is covered by a clear capsule. Part of that capsule is intentionally left in the eye to support the IOL.   Your surgeon may or may not use stitches to close the incision.  There are other forms of cataract surgery that require a larger incision and stiches to close the eye. This approach is taken in cases where the doctor feels that the cataract cannot be easily removed using phacoemulsification. AFTER THE PROCEDURE  When an IOL is implanted, it does not need care. It becomes  a permanent part of your eye and cannot be seen or felt.   Your doctor will schedule follow-up exams to check on your progress.   Review your other medicines with your doctor to see which can be resumed after surgery.   Use eyedrops or take medicine as prescribed by your doctor.  Document Released: 12/16/2010 Document Reviewed: 12/13/2010 [facility] Patient Information 2012 [place].  .Cataract Surgery Care After Refer to this sheet in the next few weeks. These instructions provide you with information on caring for yourself after your procedure. Your caregiver may also give you more specific instructions. Your treatment has been planned according to current medical practices, but problems sometimes occur. Call your caregiver if you have any problems or questions after your procedure.  HOME CARE INSTRUCTIONS   Avoid strenuous activities as directed by your caregiver.   Ask your caregiver when you can resume driving.   Use eyedrops or other medicines to help healing and control pressure inside your eye as directed by your caregiver.   Only take over-the-counter or prescription medicines for pain, discomfort, or fever as directed by your caregiver.   Do not to touch or rub your eyes.   You may be instructed to use a protective shield during the first few days and nights after surgery. If not, wear sunglasses to protect your eyes. This is to protect the eye from pressure or from being accidentally bumped.   Keep the area around your eye clean and dry. Avoid swimming or allowing water to hit you directly in the face while showering. Keep soap and shampoo out of your eyes.   Do not bend or lift heavy objects. Bending increases pressure in the eye. You can walk, climb stairs, and do light household chores.   Do not put a contact lens into the eye that had surgery until your caregiver says it is okay to do so.   Ask your doctor when you can return to work. This will depend on the kind  of work that you do. If you work in a dusty environment, you may be advised to wear protective eyewear for  a period of time.   Ask your caregiver when it will be safe to engage in sexual activity.   Continue with your regular eye exams as directed by your caregiver.  What to expect:  It is normal to feel itching and mild discomfort for a few days after cataract surgery. Some fluid discharge is also common, and your eye may be sensitive to light and touch.   After 1 to 2 days, even moderate discomfort should disappear. In most cases, healing will take about 6 weeks.   If you received an intraocular lens (IOL), you may notice that colors are very bright or have a blue tinge. Also, if you have been in bright sunlight, everything may appear reddish for a few hours. If you see these color tinges, it is because your lens is clear and no longer cloudy. Within a few months after receiving an IOL, these extra colors should go away. When you have healed, you will probably need new glasses.  SEEK MEDICAL CARE IF:   You have increased bruising around your eye.   You have discomfort not helped by medicine.  SEEK IMMEDIATE MEDICAL CARE IF:   You have a  fever.   You have a worsening or sudden vision loss.   You have redness, swelling, or increasing pain in the eye.   You have a thick discharge from the eye that had surgery.  MAKE SURE YOU:  Understand these instructions.   Will watch your condition.   Will get help right away if you are not doing well or get worse.  Document Released: 07/16/2004 Document Revised: 12/16/2010 Document Reviewed: 08/20/2010 [facility] Patient Information 2012 [place].    Monitored Anesthesia Care  Monitored anesthesia care is an anesthesia service for a medical procedure. Anesthesia is the loss of the ability to feel pain. It is produced by medications called anesthetics. It may affect a small area of your body (local anesthesia), a large area of your body  (regional anesthesia), or your entire body (general anesthesia). The need for monitored anesthesia care depends your procedure, your condition, and the potential need for regional or general anesthesia. It is often provided during procedures where:   General anesthesia may be needed if there are complications. This is because you need special care when you are under general anesthesia.    You will be under local or regional anesthesia. This is so that you are able to have higher levels of anesthesia if needed.    You will receive calming medications (sedatives). This is especially the case if sedatives are given to put you in a semi-conscious state of relaxation (deep sedation). This is because the amount of sedative needed to produce this state can be hard to predict. Too much of a sedative can produce general anesthesia. Monitored anesthesia care is performed by one or more caregivers who have special training in all types of anesthesia. You will need to meet with these caregivers before your procedure. During this meeting, they will ask you about your medical history. They will also give you instructions to follow. (For example, you will need to stop eating and drinking before your procedure. You may also need to stop or change medications you are taking.) During your procedure, your caregivers will stay with you. They will:   Watch your condition. This includes watching you blood pressure, breathing, and level of pain.    Diagnose and treat problems that occur.    Give medications if they are needed. These may  include calming medications (sedatives) and anesthetics.    Make sure you are comfortable.   Having monitored anesthesia care does not necessarily mean that you will be under anesthesia. It does mean that your caregivers will be able to manage anesthesia if you need it or if it occurs. It also means that you will be able to have a different type of anesthesia than you are having if you need  it. When your procedure is complete, your caregivers will continue to watch your condition. They will make sure any medications wear off before you are allowed to go home.  Document Released: 09/22/2004 Document Revised: 04/23/2012 Document Reviewed: 02/08/2012 [facility] Patient Information 2014 [place], [place].

## 2018-08-03 ENCOUNTER — Other Ambulatory Visit (HOSPITAL_COMMUNITY)
Admission: RE | Admit: 2018-08-03 | Discharge: 2018-08-03 | Disposition: A | Discharge status: Home / Self Care | Payer: Medicare Other | Source: Ambulatory Visit | Attending: Ophthalmology | Admitting: Ophthalmology

## 2018-08-03 ENCOUNTER — Other Ambulatory Visit: Payer: Self-pay

## 2018-08-03 ENCOUNTER — Encounter (HOSPITAL_COMMUNITY)
Admission: RE | Admit: 2018-08-03 | Discharge: 2018-08-03 | Disposition: A | Discharge status: Home / Self Care | Payer: Medicare Other | Source: Ambulatory Visit | Attending: Ophthalmology | Admitting: Ophthalmology

## 2018-08-03 ENCOUNTER — Encounter (HOSPITAL_COMMUNITY): Payer: Self-pay

## 2018-08-03 DIAGNOSIS — Z1159 Encounter for screening for other viral diseases: Secondary | ICD-10-CM | POA: Diagnosis not present

## 2018-08-04 DIAGNOSIS — D631 Anemia in chronic kidney disease: Secondary | ICD-10-CM | POA: Diagnosis not present

## 2018-08-04 DIAGNOSIS — N2581 Secondary hyperparathyroidism of renal origin: Secondary | ICD-10-CM | POA: Diagnosis not present

## 2018-08-04 DIAGNOSIS — D509 Iron deficiency anemia, unspecified: Secondary | ICD-10-CM | POA: Diagnosis not present

## 2018-08-04 DIAGNOSIS — Z992 Dependence on renal dialysis: Secondary | ICD-10-CM | POA: Diagnosis not present

## 2018-08-04 DIAGNOSIS — N186 End stage renal disease: Secondary | ICD-10-CM | POA: Diagnosis not present

## 2018-08-04 LAB — SARS CORONAVIRUS 2 (TAT 6-24 HRS): SARS Coronavirus 2: NEGATIVE

## 2018-08-06 ENCOUNTER — Other Ambulatory Visit: Payer: Self-pay

## 2018-08-06 ENCOUNTER — Ambulatory Visit (HOSPITAL_COMMUNITY): Payer: Medicare Other | Admitting: Anesthesiology

## 2018-08-06 ENCOUNTER — Ambulatory Visit (HOSPITAL_COMMUNITY)
Admission: RE | Admit: 2018-08-06 | Discharge: 2018-08-06 | Disposition: A | Discharge status: Home / Self Care | Payer: Medicare Other | Source: Ambulatory Visit | Attending: Ophthalmology | Admitting: Ophthalmology

## 2018-08-06 ENCOUNTER — Encounter (HOSPITAL_COMMUNITY)
Admission: RE | Disposition: A | Discharge status: Home / Self Care | Payer: Self-pay | Source: Ambulatory Visit | Attending: Ophthalmology

## 2018-08-06 ENCOUNTER — Encounter (HOSPITAL_COMMUNITY): Payer: Self-pay | Admitting: Anesthesiology

## 2018-08-06 DIAGNOSIS — Z7982 Long term (current) use of aspirin: Secondary | ICD-10-CM | POA: Diagnosis not present

## 2018-08-06 DIAGNOSIS — I509 Heart failure, unspecified: Secondary | ICD-10-CM | POA: Insufficient documentation

## 2018-08-06 DIAGNOSIS — Z87891 Personal history of nicotine dependence: Secondary | ICD-10-CM | POA: Diagnosis not present

## 2018-08-06 DIAGNOSIS — E1122 Type 2 diabetes mellitus with diabetic chronic kidney disease: Secondary | ICD-10-CM | POA: Insufficient documentation

## 2018-08-06 DIAGNOSIS — H2511 Age-related nuclear cataract, right eye: Secondary | ICD-10-CM | POA: Diagnosis not present

## 2018-08-06 DIAGNOSIS — Z794 Long term (current) use of insulin: Secondary | ICD-10-CM | POA: Diagnosis not present

## 2018-08-06 DIAGNOSIS — E1136 Type 2 diabetes mellitus with diabetic cataract: Secondary | ICD-10-CM | POA: Diagnosis not present

## 2018-08-06 DIAGNOSIS — Z95810 Presence of automatic (implantable) cardiac defibrillator: Secondary | ICD-10-CM | POA: Insufficient documentation

## 2018-08-06 DIAGNOSIS — N186 End stage renal disease: Secondary | ICD-10-CM | POA: Insufficient documentation

## 2018-08-06 DIAGNOSIS — H25811 Combined forms of age-related cataract, right eye: Secondary | ICD-10-CM | POA: Insufficient documentation

## 2018-08-06 DIAGNOSIS — I132 Hypertensive heart and chronic kidney disease with heart failure and with stage 5 chronic kidney disease, or end stage renal disease: Secondary | ICD-10-CM | POA: Insufficient documentation

## 2018-08-06 DIAGNOSIS — J449 Chronic obstructive pulmonary disease, unspecified: Secondary | ICD-10-CM | POA: Diagnosis not present

## 2018-08-06 DIAGNOSIS — I2510 Atherosclerotic heart disease of native coronary artery without angina pectoris: Secondary | ICD-10-CM | POA: Diagnosis not present

## 2018-08-06 DIAGNOSIS — Z79899 Other long term (current) drug therapy: Secondary | ICD-10-CM | POA: Diagnosis not present

## 2018-08-06 DIAGNOSIS — I10 Essential (primary) hypertension: Secondary | ICD-10-CM | POA: Insufficient documentation

## 2018-08-06 DIAGNOSIS — Z992 Dependence on renal dialysis: Secondary | ICD-10-CM | POA: Diagnosis not present

## 2018-08-06 DIAGNOSIS — M1990 Unspecified osteoarthritis, unspecified site: Secondary | ICD-10-CM | POA: Insufficient documentation

## 2018-08-06 DIAGNOSIS — E1151 Type 2 diabetes mellitus with diabetic peripheral angiopathy without gangrene: Secondary | ICD-10-CM | POA: Diagnosis not present

## 2018-08-06 HISTORY — PX: CATARACT EXTRACTION W/PHACO: SHX586

## 2018-08-06 LAB — CBC WITH DIFFERENTIAL/PLATELET
Abs Immature Granulocytes: 0.02 10*3/uL (ref 0.00–0.07)
Basophils Absolute: 0.1 10*3/uL (ref 0.0–0.1)
Basophils Relative: 1 %
Eosinophils Absolute: 0.3 10*3/uL (ref 0.0–0.5)
Eosinophils Relative: 4 %
HCT: 40.4 % (ref 39.0–52.0)
Hemoglobin: 12.6 g/dL — ABNORMAL LOW (ref 13.0–17.0)
Immature Granulocytes: 0 %
Lymphocytes Relative: 22 %
Lymphs Abs: 1.6 10*3/uL (ref 0.7–4.0)
MCH: 29.2 pg (ref 26.0–34.0)
MCHC: 31.2 g/dL (ref 30.0–36.0)
MCV: 93.5 fL (ref 80.0–100.0)
Monocytes Absolute: 0.7 10*3/uL (ref 0.1–1.0)
Monocytes Relative: 10 %
Neutro Abs: 4.7 10*3/uL (ref 1.7–7.7)
Neutrophils Relative %: 63 %
Platelets: 153 10*3/uL (ref 150–400)
RBC: 4.32 MIL/uL (ref 4.22–5.81)
RDW: 14.8 % (ref 11.5–15.5)
WBC: 7.3 10*3/uL (ref 4.0–10.5)
nRBC: 0 % (ref 0.0–0.2)

## 2018-08-06 LAB — BASIC METABOLIC PANEL
Anion gap: 15 (ref 5–15)
BUN: 68 mg/dL — ABNORMAL HIGH (ref 6–20)
CO2: 24 mmol/L (ref 22–32)
Calcium: 8.6 mg/dL — ABNORMAL LOW (ref 8.9–10.3)
Chloride: 94 mmol/L — ABNORMAL LOW (ref 98–111)
Creatinine, Ser: 10.48 mg/dL — ABNORMAL HIGH (ref 0.61–1.24)
GFR calc Af Amer: 6 mL/min — ABNORMAL LOW (ref >60)
GFR calc non Af Amer: 5 mL/min — ABNORMAL LOW (ref >60)
Glucose, Bld: 145 mg/dL — ABNORMAL HIGH (ref 70–99)
Potassium: 4.8 mmol/L (ref 3.5–5.1)
Sodium: 133 mmol/L — ABNORMAL LOW (ref 135–145)

## 2018-08-06 LAB — HEMOGLOBIN A1C
Hgb A1c MFr Bld: 8.9 % — ABNORMAL HIGH (ref 4.8–5.6)
Mean Plasma Glucose: 208.73 mg/dL

## 2018-08-06 SURGERY — PHACOEMULSIFICATION, CATARACT, WITH IOL INSERTION
Anesthesia: Monitor Anesthesia Care | Site: Eye | Laterality: Right

## 2018-08-06 MED ORDER — LIDOCAINE HCL 3.5 % OP GEL
1.0000 "application " | Freq: Once | OPHTHALMIC | Status: AC
  Administered 2018-08-06: 1 via OPHTHALMIC

## 2018-08-06 MED ORDER — EPINEPHRINE PF 1 MG/ML IJ SOLN
INTRAOCULAR | Status: DC | PRN
  Administered 2018-08-06: 500 mL

## 2018-08-06 MED ORDER — TETRACAINE HCL 0.5 % OP SOLN
1.0000 [drp] | OPHTHALMIC | Status: AC
  Administered 2018-08-06 (×3): 1 [drp] via OPHTHALMIC

## 2018-08-06 MED ORDER — CYCLOPENTOLATE-PHENYLEPHRINE 0.2-1 % OP SOLN
1.0000 [drp] | OPHTHALMIC | Status: AC
  Administered 2018-08-06 (×3): 1 [drp] via OPHTHALMIC

## 2018-08-06 MED ORDER — SODIUM HYALURONATE 23 MG/ML IO SOLN
INTRAOCULAR | Status: DC | PRN
  Administered 2018-08-06: 0.6 mL via INTRAOCULAR

## 2018-08-06 MED ORDER — LIDOCAINE HCL (PF) 1 % IJ SOLN
INTRAOCULAR | Status: DC | PRN
  Administered 2018-08-06: 1 mL via OPHTHALMIC

## 2018-08-06 MED ORDER — POVIDONE-IODINE 5 % OP SOLN
OPHTHALMIC | Status: DC | PRN
  Administered 2018-08-06: 1 via OPHTHALMIC

## 2018-08-06 MED ORDER — BSS IO SOLN
INTRAOCULAR | Status: DC | PRN
  Administered 2018-08-06: 15 mL

## 2018-08-06 MED ORDER — NEOMYCIN-POLYMYXIN-DEXAMETH 3.5-10000-0.1 OP SUSP
OPHTHALMIC | Status: DC | PRN
  Administered 2018-08-06: 2 [drp] via OPHTHALMIC

## 2018-08-06 MED ORDER — PROVISC 10 MG/ML IO SOLN
INTRAOCULAR | Status: DC | PRN
  Administered 2018-08-06: 0.85 mL via INTRAOCULAR

## 2018-08-06 MED ORDER — PHENYLEPHRINE HCL 2.5 % OP SOLN
1.0000 [drp] | OPHTHALMIC | Status: AC
  Administered 2018-08-06 (×3): 1 [drp] via OPHTHALMIC

## 2018-08-06 SURGICAL SUPPLY — 15 items
CLOTH BEACON ORANGE TIMEOUT ST (SAFETY) ×2 IMPLANT
EYE SHIELD UNIVERSAL CLEAR (GAUZE/BANDAGES/DRESSINGS) ×2 IMPLANT
GLOVE BIOGEL PI IND STRL 6.5 (GLOVE) IMPLANT
GLOVE BIOGEL PI IND STRL 7.0 (GLOVE) IMPLANT
GLOVE BIOGEL PI INDICATOR 6.5 (GLOVE) ×2
GLOVE BIOGEL PI INDICATOR 7.0 (GLOVE) ×2
LENS ALC ACRYL/TECN (Ophthalmic Related) ×2 IMPLANT
NDL HYPO 18GX1.5 BLUNT FILL (NEEDLE) IMPLANT
NEEDLE HYPO 18GX1.5 BLUNT FILL (NEEDLE) ×3 IMPLANT
PAD ARMBOARD 7.5X6 YLW CONV (MISCELLANEOUS) ×2 IMPLANT
SYR TB 1ML LL NO SAFETY (SYRINGE) ×2 IMPLANT
TAPE SURG TRANSPORE 1 IN (GAUZE/BANDAGES/DRESSINGS) IMPLANT
TAPE SURGICAL TRANSPORE 1 IN (GAUZE/BANDAGES/DRESSINGS) ×2
VISCOELASTIC ADDITIONAL (OPHTHALMIC RELATED) ×2 IMPLANT
WATER STERILE IRR 250ML POUR (IV SOLUTION) ×2 IMPLANT

## 2018-08-06 NOTE — Discharge Instructions (Signed)
Please discharge patient when stable, will follow up today with Dr. Geanine Vandekamp at the [facility] office immediately following discharge.  Leave shield in place until visit.  All paperwork with discharge instructions will be given at the office. ° ° °PATIENT INSTRUCTIONS °POST-ANESTHESIA ° °IMMEDIATELY FOLLOWING SURGERY:  Do not drive or operate machinery for the first twenty four hours after surgery.  Do not make any important decisions for twenty four hours after surgery or while taking narcotic pain medications or sedatives.  If you develop intractable nausea and vomiting or a severe headache please notify your doctor immediately. ° °FOLLOW-UP:  Please make an appointment with your surgeon as instructed. You do not need to follow up with anesthesia unless specifically instructed to do so. ° °WOUND CARE INSTRUCTIONS (if applicable):  Keep a dry clean dressing on the anesthesia/puncture wound site if there is drainage.  Once the wound has quit draining you may leave it open to air.  Generally you should leave the bandage intact for twenty four hours unless there is drainage.  If the epidural site drains for more than 36-48 hours please call the anesthesia department. ° °QUESTIONS?:  Please feel free to call your physician or the hospital operator if you have any questions, and they will be happy to assist you.    ° ° ° °

## 2018-08-06 NOTE — Anesthesia Procedure Notes (Signed)
Procedure Name: [place] Performed by: Andree Elk Amy A, CRNA Pre-anesthesia Checklist: Patient identified, Emergency Drugs available, Suction available, Timeout performed and Patient being monitored Patient Re-evaluated:Patient Re-evaluated prior to induction Oxygen Delivery Method: Nasal Cannula

## 2018-08-06 NOTE — Anesthesia Preprocedure Evaluation (Signed)
Anesthesia Evaluation  Patient identified by MRN, date of birth, ID band Patient awake    Reviewed: Allergy & Precautions, NPO status , Patient's Chart, lab work & pertinent test results, reviewed documented beta blocker date and time   Airway Mallampati: II  TM Distance: >3 FB Neck ROM: Full    Dental no notable dental hx. (+) Partial Upper   Pulmonary pneumonia, resolved, COPD,  COPD inhaler, former smoker,  Ex smoker -quit 2012 -states last inhalers were over a month ago   Pulmonary exam normal breath sounds clear to auscultation       Cardiovascular Exercise Tolerance: Good hypertension, Pt. on medications and Pt. on home beta blockers + CAD, + Peripheral Vascular Disease and +CHF  Normal cardiovascular exam+ Cardiac Defibrillator I Rhythm:Regular Rate:Normal  Reports good ET Denies any recent CP or DOE S/p R BKA EFs ~27 % per chart -NIDM  AICD placed 2017 -never went off    Neuro/Psych PSYCHIATRIC DISORDERS  Neuromuscular disease    GI/Hepatic negative GI ROS, Neg liver ROS,   Endo/Other  negative endocrine ROSdiabetes  Renal/GU ESRF and DialysisRenal diseaseHD  T, Th, Sat  K+ normal   negative genitourinary   Musculoskeletal  (+) Arthritis , Osteoarthritis,    Abdominal   Peds negative pediatric ROS (+)  Hematology negative hematology ROS (+) anemia ,   Anesthesia Other Findings   Reproductive/Obstetrics negative OB ROS                             Anesthesia Physical Anesthesia Plan  ASA: IV  Anesthesia Plan: MAC   Post-op Pain Management:    Induction: Intravenous  PONV Risk Score and Plan: Treatment may vary due to age or medical condition  Airway Management Planned: Nasal Cannula and Simple Face Mask  Additional Equipment:   Intra-op Plan:   Post-operative Plan:   Informed Consent: I have reviewed the patients History and Physical, chart, labs and discussed  the procedure including the risks, benefits and alternatives for the proposed anesthesia with the patient or authorized representative who has indicated his/her understanding and acceptance.     Dental advisory given  Plan Discussed with: CRNA  Anesthesia Plan Comments: (Plan Full PPE use  Plan MAC Pt with AICD)        Anesthesia Quick Evaluation

## 2018-08-06 NOTE — H&P (Signed)
The H and P was reviewed and updated. The patient was examined.  No changes were found after exam.  The surgical eye was marked.  

## 2018-08-06 NOTE — Anesthesia Postprocedure Evaluation (Signed)
Anesthesia Post Note  Patient: Anthony Bullock  Procedure(s) Performed: CATARACT EXTRACTION PHACO AND INTRAOCULAR LENS PLACEMENT ([place]) (Right Eye)  Patient location during evaluation: Short Stay Anesthesia Type: MAC Level of consciousness: awake and alert and oriented Pain management: pain level controlled Vital Signs Assessment: post-procedure vital signs reviewed and stable Respiratory status: spontaneous breathing Cardiovascular status: stable Postop Assessment: no apparent nausea or vomiting Anesthetic complications: no     Last Vitals:  Vitals:   08/06/18 0717  BP: (!) 149/59  Pulse: 78  Resp: 18  Temp: 36.4 C  SpO2: 97%    Last Pain:  Vitals:   08/06/18 0717  TempSrc: Oral  PainSc: 0-No pain                 ADAMS, AMY A

## 2018-08-06 NOTE — Transfer of Care (Signed)
Immediate Anesthesia Transfer of Care Note  Patient: Anthony Bullock  Procedure(s) Performed: CATARACT EXTRACTION PHACO AND INTRAOCULAR LENS PLACEMENT ([facility]) (Right Eye)  Patient Location: Short Stay  Anesthesia Type:MAC  Level of Consciousness: awake, alert , oriented and patient cooperative  Airway & Oxygen Therapy: Patient Spontanous Breathing  Post-op Assessment: Report given to RN and Post -op Vital signs reviewed and stable  Post vital signs: Reviewed and stable  Last Vitals:  Vitals Value Taken Time  BP    Temp    Pulse    Resp    SpO2      Last Pain:  Vitals:   08/06/18 0717  TempSrc: Oral  PainSc: 0-No pain      Patients Stated Pain Goal: 5 (88/82/80 0349)  Complications: No apparent anesthesia complications

## 2018-08-06 NOTE — Op Note (Signed)
Date of procedure: 08/06/18  Pre-operative diagnosis: Visually significant age-related cataract, Right Eye (H25.811)  Post-operative diagnosis: Visually significant age-related cataract, Right Eye  Procedure: Removal of cataract via phacoemulsification and insertion of intra-ocular lens Wynetta Emery and [employer] PCB00  +20.5D into the capsular bag of the Right Eye  Attending surgeon: Gerda Diss. Tiyonna Sardinha, MD, MA  Anesthesia: MAC, Topical [place]  Complications: None  Estimated Blood Loss: <108m (minimal)  Specimens: None  Implants: As above  Indications:  Visually significant age-related cataract, Right Eye  Procedure:  The patient was seen and identified in the pre-operative area. The operative eye was identified and dilated.  The operative eye was marked.  Topical anesthesia was administered to the operative eye.     The patient was then to the operative suite and placed in the supine position.  A timeout was performed confirming the patient, procedure to be performed, and all other relevant information.   The patient's face was prepped and draped in the usual fashion for intra-ocular surgery.  A lid speculum was placed into the operative eye and the surgical microscope moved into place and focused.  A superotemporal paracentesis was created using a 20 gauge paracentesis blade.  Shugarcaine was injected into the anterior chamber.  Viscoelastic was injected into the anterior chamber.  A temporal clear-corneal main wound incision was created using a 2.417mmicrokeratome.  A continuous curvilinear capsulorrhexis was initiated using an irrigating cystitome and completed using capsulorrhexis forceps.  Hydrodissection and hydrodeliniation were performed.  Viscoelastic was injected into the anterior chamber.  A phacoemulsification handpiece and a chopper as a second instrument were used to remove the nucleus and epinucleus. The irrigation/aspiration handpiece was used to remove any remaining cortical  material.   The capsular bag was reinflated with viscoelastic, checked, and found to be intact.  The intraocular lens was inserted into the capsular bag and dialed into place using a Kuglen hook.  The irrigation/aspiration handpiece was used to remove any remaining viscoelastic.  The clear corneal wound and paracentesis wounds were then hydrated and checked with Weck-Cels to be watertight.  There was a small epithelial defect superiorly at the end of the case. The lid-speculum and drape was removed, and the patient's face was cleaned with a wet and dry 4x4.  Maxitrol was instilled in the eye before a clear shield was taped over the eye. The patient was taken to the post-operative care unit in good condition, having tolerated the procedure well.  Post-Op Instructions: [facility] and will receive all other orders and instructions.

## 2018-08-07 ENCOUNTER — Encounter (HOSPITAL_COMMUNITY): Payer: Self-pay | Admitting: Ophthalmology

## 2018-08-07 DIAGNOSIS — D631 Anemia in chronic kidney disease: Secondary | ICD-10-CM | POA: Diagnosis not present

## 2018-08-07 DIAGNOSIS — Z992 Dependence on renal dialysis: Secondary | ICD-10-CM | POA: Diagnosis not present

## 2018-08-07 DIAGNOSIS — N2581 Secondary hyperparathyroidism of renal origin: Secondary | ICD-10-CM | POA: Diagnosis not present

## 2018-08-07 DIAGNOSIS — N186 End stage renal disease: Secondary | ICD-10-CM | POA: Diagnosis not present

## 2018-08-07 DIAGNOSIS — D509 Iron deficiency anemia, unspecified: Secondary | ICD-10-CM | POA: Diagnosis not present

## 2018-08-08 ENCOUNTER — Other Ambulatory Visit: Payer: Self-pay | Admitting: Family Medicine

## 2018-08-09 DIAGNOSIS — Z992 Dependence on renal dialysis: Secondary | ICD-10-CM | POA: Diagnosis not present

## 2018-08-09 DIAGNOSIS — D631 Anemia in chronic kidney disease: Secondary | ICD-10-CM | POA: Diagnosis not present

## 2018-08-09 DIAGNOSIS — N2581 Secondary hyperparathyroidism of renal origin: Secondary | ICD-10-CM | POA: Diagnosis not present

## 2018-08-09 DIAGNOSIS — D509 Iron deficiency anemia, unspecified: Secondary | ICD-10-CM | POA: Diagnosis not present

## 2018-08-09 DIAGNOSIS — N186 End stage renal disease: Secondary | ICD-10-CM | POA: Diagnosis not present

## 2018-08-10 DIAGNOSIS — N186 End stage renal disease: Secondary | ICD-10-CM | POA: Diagnosis not present

## 2018-08-10 DIAGNOSIS — Z992 Dependence on renal dialysis: Secondary | ICD-10-CM | POA: Diagnosis not present

## 2018-08-11 DIAGNOSIS — D509 Iron deficiency anemia, unspecified: Secondary | ICD-10-CM | POA: Diagnosis not present

## 2018-08-11 DIAGNOSIS — N2581 Secondary hyperparathyroidism of renal origin: Secondary | ICD-10-CM | POA: Diagnosis not present

## 2018-08-11 DIAGNOSIS — D631 Anemia in chronic kidney disease: Secondary | ICD-10-CM | POA: Diagnosis not present

## 2018-08-11 DIAGNOSIS — N186 End stage renal disease: Secondary | ICD-10-CM | POA: Diagnosis not present

## 2018-08-11 DIAGNOSIS — Z992 Dependence on renal dialysis: Secondary | ICD-10-CM | POA: Diagnosis not present

## 2018-08-13 DIAGNOSIS — H25812 Combined forms of age-related cataract, left eye: Secondary | ICD-10-CM | POA: Diagnosis not present

## 2018-08-14 DIAGNOSIS — D631 Anemia in chronic kidney disease: Secondary | ICD-10-CM | POA: Diagnosis not present

## 2018-08-14 DIAGNOSIS — N186 End stage renal disease: Secondary | ICD-10-CM | POA: Diagnosis not present

## 2018-08-14 DIAGNOSIS — Z992 Dependence on renal dialysis: Secondary | ICD-10-CM | POA: Diagnosis not present

## 2018-08-14 DIAGNOSIS — N2581 Secondary hyperparathyroidism of renal origin: Secondary | ICD-10-CM | POA: Diagnosis not present

## 2018-08-14 DIAGNOSIS — D509 Iron deficiency anemia, unspecified: Secondary | ICD-10-CM | POA: Diagnosis not present

## 2018-08-15 NOTE — H&P (Signed)
Surgical History & Physical  Patient Name: Anthony Bullock DOB: 03/07/1960  Surgery: Cataract extraction with intraocular lens implant phacoemulsification; Left Eye  Surgeon: Anthony Goldmann MD Surgery Date:  08/20/2018 Pre-Op Date:  08/13/2018  HPI: A 24 Yr. old male patient 1. The patient is returning after cataract surgery. The right eye is affected. Status post cataract surgery: Onset was S/P 08/06/18 PC IOL (Preop OS). Since the last visit, the affected area feels improvement and is doing well. The patient's vision is improved-no more glare/halos with headlights. Patient is following medication instructions 3 in 1 TID OD. No irritations or discomforts since surgery. Patient ready to proceed with OS to have BCVA OU. OS blurred c/w OD. Halos/glare significant OS. This is negatively affecting the patient's quality of life. HPI was performed by Anthony Bullock .  Medical History: IOL OD Diabetes - DM Type 2 Heart Problem High Blood Pressure Kidney, failure ( Dialysis)  Review of Systems Negative Allergic/Immunologic Negative Cardiovascular Negative Constitutional Negative Ear, Nose, Mouth & Throat Negative Endocrine Negative Eyes Negative Gastrointestinal Negative Genitourinary Negative Hemotologic/Lymphatic Negative Integumentary Negative Musculoskeletal Negative Neurological Negative Psychiatry Negative Respiratory   Social   Former smoker  Medication Azelastine, Prednisolon-gatiflox-bromfenac,  Aspirin, Atorvastatin, Carvedilol, Fenofibrate, Fexofenadine, Gabapentin, Insulin, Lantus, Fosrenol, Furosemide,   Sx/Procedures Phaco c IOL OD,  Hernia Sx, Urethral stricture sx, Knee Replacement, Kidney Stone Removal, Defibrillator, Urethral reconstruction and graft,   Drug Allergies  Morphine,   History & Physical: Heent:  Cataract, Left eye NECK: supple without bruits LUNGS: lungs clear to auscultation CV: regular rate and rhythm Abdomen: soft and non-tender Impression &  Plan: Assessment: 1.  COMBINED FORMS AGE RELATED CATARACT; Left Eye (H25.812) 2.  CATARACT EXTRACTION STATUS; Right Eye (Z98.41) 3.  Myopia ; Left Eye (H52.12)  Plan: 1.  Cataract accounts for the patient's decreased vision. This visual impairment is not correctable with a tolerable change in glasses or contact lenses. Cataract surgery with an implantation of a new lens should significantly improve the visual and functional status of the patient. Discussed all risks, benefits, alternatives, and potential complications. Discussed the procedures and recovery. Patient desires to have surgery. A-scan ordered and performed today for intra-ocular lens calculations. The surgery will be performed in order to improve vision for driving, reading, and for eye examinations. Recommend phacoemulsification with intra-ocular lens. Left Eye. Surgery required to correct imbalance of vision. Dilates well - shugarcaine by protocol. 2.  1 week after cataract surgery. Doing well with improved vision and normal eye pressure. Call with any problems or concerns. Continue Gati-Brom-Pred 2x/day for 3 more weeks.

## 2018-08-16 DIAGNOSIS — N2581 Secondary hyperparathyroidism of renal origin: Secondary | ICD-10-CM | POA: Diagnosis not present

## 2018-08-16 DIAGNOSIS — D631 Anemia in chronic kidney disease: Secondary | ICD-10-CM | POA: Diagnosis not present

## 2018-08-16 DIAGNOSIS — D509 Iron deficiency anemia, unspecified: Secondary | ICD-10-CM | POA: Diagnosis not present

## 2018-08-16 DIAGNOSIS — N186 End stage renal disease: Secondary | ICD-10-CM | POA: Diagnosis not present

## 2018-08-16 DIAGNOSIS — Z992 Dependence on renal dialysis: Secondary | ICD-10-CM | POA: Diagnosis not present

## 2018-08-17 ENCOUNTER — Other Ambulatory Visit (HOSPITAL_COMMUNITY)
Admission: RE | Admit: 2018-08-17 | Discharge: 2018-08-17 | Disposition: A | Discharge status: Home / Self Care | Payer: Medicare Other | Source: Ambulatory Visit | Attending: Ophthalmology | Admitting: Ophthalmology

## 2018-08-17 ENCOUNTER — Encounter (HOSPITAL_COMMUNITY)
Admission: RE | Admit: 2018-08-17 | Discharge: 2018-08-17 | Disposition: A | Discharge status: Home / Self Care | Payer: Medicare Other | Source: Ambulatory Visit | Attending: Ophthalmology | Admitting: Ophthalmology

## 2018-08-17 ENCOUNTER — Other Ambulatory Visit: Payer: Self-pay

## 2018-08-17 ENCOUNTER — Encounter (HOSPITAL_COMMUNITY): Payer: Self-pay

## 2018-08-17 DIAGNOSIS — Z20828 Contact with and (suspected) exposure to other viral communicable diseases: Secondary | ICD-10-CM | POA: Diagnosis not present

## 2018-08-17 DIAGNOSIS — Z992 Dependence on renal dialysis: Secondary | ICD-10-CM | POA: Diagnosis not present

## 2018-08-17 DIAGNOSIS — Z01812 Encounter for preprocedural laboratory examination: Secondary | ICD-10-CM | POA: Diagnosis not present

## 2018-08-17 DIAGNOSIS — E8779 Other fluid overload: Secondary | ICD-10-CM | POA: Diagnosis not present

## 2018-08-17 DIAGNOSIS — N186 End stage renal disease: Secondary | ICD-10-CM | POA: Diagnosis not present

## 2018-08-17 MED ORDER — MIDAZOLAM HCL 2 MG/2ML IJ SOLN: 0.5000 mg | Freq: Once | INTRAMUSCULAR | Status: DC | PRN

## 2018-08-17 MED ORDER — LACTATED RINGERS IV SOLN: INTRAVENOUS | Status: DC

## 2018-08-17 MED ORDER — PROMETHAZINE HCL 25 MG/ML IJ SOLN: 6.2500 mg | INTRAMUSCULAR | Status: DC | PRN

## 2018-08-18 DIAGNOSIS — N2581 Secondary hyperparathyroidism of renal origin: Secondary | ICD-10-CM | POA: Diagnosis not present

## 2018-08-18 DIAGNOSIS — D631 Anemia in chronic kidney disease: Secondary | ICD-10-CM | POA: Diagnosis not present

## 2018-08-18 DIAGNOSIS — D509 Iron deficiency anemia, unspecified: Secondary | ICD-10-CM | POA: Diagnosis not present

## 2018-08-18 DIAGNOSIS — N186 End stage renal disease: Secondary | ICD-10-CM | POA: Diagnosis not present

## 2018-08-18 DIAGNOSIS — Z992 Dependence on renal dialysis: Secondary | ICD-10-CM | POA: Diagnosis not present

## 2018-08-18 LAB — SARS CORONAVIRUS 2 (TAT 6-24 HRS): SARS Coronavirus 2: NEGATIVE

## 2018-08-20 ENCOUNTER — Ambulatory Visit (HOSPITAL_COMMUNITY)
Admission: RE | Admit: 2018-08-20 | Discharge: 2018-08-20 | Disposition: A | Discharge status: Home / Self Care | Payer: Medicare Other | Attending: Ophthalmology | Admitting: Ophthalmology

## 2018-08-20 ENCOUNTER — Other Ambulatory Visit: Payer: Self-pay

## 2018-08-20 ENCOUNTER — Encounter (HOSPITAL_COMMUNITY): Payer: Self-pay

## 2018-08-20 ENCOUNTER — Ambulatory Visit (HOSPITAL_COMMUNITY): Payer: Medicare Other | Admitting: Anesthesiology

## 2018-08-20 ENCOUNTER — Encounter (HOSPITAL_COMMUNITY)
Admission: RE | Disposition: A | Discharge status: Home / Self Care | Payer: Self-pay | Source: Home / Self Care | Attending: Ophthalmology

## 2018-08-20 DIAGNOSIS — H25812 Combined forms of age-related cataract, left eye: Secondary | ICD-10-CM | POA: Diagnosis not present

## 2018-08-20 DIAGNOSIS — I10 Essential (primary) hypertension: Secondary | ICD-10-CM | POA: Insufficient documentation

## 2018-08-20 DIAGNOSIS — Z794 Long term (current) use of insulin: Secondary | ICD-10-CM | POA: Insufficient documentation

## 2018-08-20 DIAGNOSIS — J449 Chronic obstructive pulmonary disease, unspecified: Secondary | ICD-10-CM | POA: Diagnosis not present

## 2018-08-20 DIAGNOSIS — E1136 Type 2 diabetes mellitus with diabetic cataract: Secondary | ICD-10-CM | POA: Diagnosis not present

## 2018-08-20 DIAGNOSIS — N186 End stage renal disease: Secondary | ICD-10-CM | POA: Diagnosis not present

## 2018-08-20 DIAGNOSIS — E1122 Type 2 diabetes mellitus with diabetic chronic kidney disease: Secondary | ICD-10-CM | POA: Diagnosis not present

## 2018-08-20 DIAGNOSIS — I120 Hypertensive chronic kidney disease with stage 5 chronic kidney disease or end stage renal disease: Secondary | ICD-10-CM | POA: Diagnosis not present

## 2018-08-20 DIAGNOSIS — Z87891 Personal history of nicotine dependence: Secondary | ICD-10-CM | POA: Insufficient documentation

## 2018-08-20 DIAGNOSIS — Z992 Dependence on renal dialysis: Secondary | ICD-10-CM | POA: Diagnosis not present

## 2018-08-20 DIAGNOSIS — Z79899 Other long term (current) drug therapy: Secondary | ICD-10-CM | POA: Diagnosis not present

## 2018-08-20 DIAGNOSIS — Z7982 Long term (current) use of aspirin: Secondary | ICD-10-CM | POA: Diagnosis not present

## 2018-08-20 DIAGNOSIS — I509 Heart failure, unspecified: Secondary | ICD-10-CM | POA: Diagnosis not present

## 2018-08-20 HISTORY — PX: CATARACT EXTRACTION W/PHACO: SHX586

## 2018-08-20 LAB — GLUCOSE, CAPILLARY: Glucose-Capillary: 123 mg/dL — ABNORMAL HIGH (ref 70–99)

## 2018-08-20 SURGERY — PHACOEMULSIFICATION, CATARACT, WITH IOL INSERTION
Anesthesia: Monitor Anesthesia Care | Site: Eye | Laterality: Left

## 2018-08-20 MED ORDER — NEOMYCIN-POLYMYXIN-DEXAMETH 3.5-10000-0.1 OP SUSP
OPHTHALMIC | Status: DC | PRN
  Administered 2018-08-20: 2 [drp] via OPHTHALMIC

## 2018-08-20 MED ORDER — LIDOCAINE HCL 3.5 % OP GEL
1.0000 "application " | Freq: Once | OPHTHALMIC | Status: AC
  Administered 2018-08-20: 1 via OPHTHALMIC

## 2018-08-20 MED ORDER — SODIUM HYALURONATE 23 MG/ML IO SOLN
INTRAOCULAR | Status: DC | PRN
  Administered 2018-08-20: 0.6 mL via INTRAOCULAR

## 2018-08-20 MED ORDER — EPINEPHRINE PF 1 MG/ML IJ SOLN
INTRAOCULAR | Status: DC | PRN
  Administered 2018-08-20: 500 mL

## 2018-08-20 MED ORDER — CYCLOPENTOLATE-PHENYLEPHRINE 0.2-1 % OP SOLN
1.0000 [drp] | OPHTHALMIC | Status: AC | PRN
  Administered 2018-08-20 (×3): 1 [drp] via OPHTHALMIC

## 2018-08-20 MED ORDER — PHENYLEPHRINE HCL 2.5 % OP SOLN
1.0000 [drp] | OPHTHALMIC | Status: AC | PRN
  Administered 2018-08-20 (×3): 1 [drp] via OPHTHALMIC

## 2018-08-20 MED ORDER — POVIDONE-IODINE 5 % OP SOLN
OPHTHALMIC | Status: DC | PRN
  Administered 2018-08-20: 1 via OPHTHALMIC

## 2018-08-20 MED ORDER — BSS IO SOLN
INTRAOCULAR | Status: DC | PRN
  Administered 2018-08-20: 15 mL

## 2018-08-20 MED ORDER — LIDOCAINE HCL (PF) 1 % IJ SOLN
INTRAOCULAR | Status: DC | PRN
  Administered 2018-08-20: 1 mL via OPHTHALMIC

## 2018-08-20 MED ORDER — PROVISC 10 MG/ML IO SOLN
INTRAOCULAR | Status: DC | PRN
  Administered 2018-08-20: 0.85 mL via INTRAOCULAR

## 2018-08-20 MED ORDER — TETRACAINE HCL 0.5 % OP SOLN
1.0000 [drp] | OPHTHALMIC | Status: AC | PRN
  Administered 2018-08-20 (×3): 1 [drp] via OPHTHALMIC

## 2018-08-20 SURGICAL SUPPLY — 13 items
CLOTH BEACON ORANGE TIMEOUT ST (SAFETY) ×2 IMPLANT
EYE SHIELD UNIVERSAL CLEAR (GAUZE/BANDAGES/DRESSINGS) ×2 IMPLANT
GLOVE BIOGEL PI IND STRL 7.0 (GLOVE) IMPLANT
GLOVE BIOGEL PI INDICATOR 7.0 (GLOVE) ×4
LENS ALC ACRYL/TECN (Ophthalmic Related) ×2 IMPLANT
NDL HYPO 18GX1.5 BLUNT FILL (NEEDLE) IMPLANT
NEEDLE HYPO 18GX1.5 BLUNT FILL (NEEDLE) ×3 IMPLANT
PAD ARMBOARD 7.5X6 YLW CONV (MISCELLANEOUS) ×2 IMPLANT
SYR TB 1ML LL NO SAFETY (SYRINGE) ×2 IMPLANT
TAPE SURG TRANSPORE 1 IN (GAUZE/BANDAGES/DRESSINGS) IMPLANT
TAPE SURGICAL TRANSPORE 1 IN (GAUZE/BANDAGES/DRESSINGS) ×2
VISCOELASTIC ADDITIONAL (OPHTHALMIC RELATED) ×2 IMPLANT
WATER STERILE IRR 250ML POUR (IV SOLUTION) ×2 IMPLANT

## 2018-08-20 NOTE — Anesthesia Postprocedure Evaluation (Signed)
Anesthesia Post Note  Patient: Anthony Bullock  Procedure(s) Performed: CATARACT EXTRACTION PHACO AND INTRAOCULAR LENS PLACEMENT ([place]) (Left Eye)  Patient location during evaluation: Short Stay Anesthesia Type: MAC Level of consciousness: awake and alert Pain management: pain level controlled Vital Signs Assessment: post-procedure vital signs reviewed and stable Respiratory status: spontaneous breathing Cardiovascular status: stable Postop Assessment: no apparent nausea or vomiting Anesthetic complications: no     Last Vitals:  Vitals:   08/20/18 0722  BP: (!) 157/69  Pulse: 86  Resp: 16  Temp: 37.1 C  SpO2: 98%    Last Pain:  Vitals:   08/20/18 0722  TempSrc: Oral  PainSc: 0-No pain                 Kwana Ringel

## 2018-08-20 NOTE — Discharge Instructions (Signed)
Please discharge patient when stable, will follow up today with Dr. Levent Kornegay at the [facility] office immediately following discharge.  Leave shield in place until visit.  All paperwork with discharge instructions will be given at the office. ° °

## 2018-08-20 NOTE — Anesthesia Preprocedure Evaluation (Signed)
Anesthesia Evaluation  Patient identified by MRN, date of birth, ID band Patient awake    Reviewed: Allergy & Precautions, NPO status , Patient's Chart, lab work & pertinent test results, reviewed documented beta blocker date and time   Airway Mallampati: II  TM Distance: >3 FB Neck ROM: Full    Dental no notable dental hx. (+) Teeth Intact   Pulmonary pneumonia, resolved, COPD,  COPD inhaler, former smoker,    Pulmonary exam normal breath sounds clear to auscultation       Cardiovascular Exercise Tolerance: Good hypertension, Pt. on medications + CAD, + Peripheral Vascular Disease and +CHF  Normal cardiovascular exam+ Cardiac Defibrillator II Rhythm:Regular Rate:Normal     Neuro/Psych PSYCHIATRIC DISORDERS  Neuromuscular disease    GI/Hepatic negative GI ROS, Neg liver ROS,   Endo/Other  negative endocrine ROSdiabetes, Well Controlled, Type 1, Insulin Dependent  Renal/GU ESRF and DialysisRenal diseaseHD T,Th,Sat-last Saturday  negative genitourinary   Musculoskeletal negative musculoskeletal ROS (+)   Abdominal   Peds negative pediatric ROS (+)  Hematology negative hematology ROS (+)   Anesthesia Other Findings   Reproductive/Obstetrics negative OB ROS                             Anesthesia Physical Anesthesia Plan  ASA: IV  Anesthesia Plan: MAC   Post-op Pain Management:    Induction: Intravenous  PONV Risk Score and Plan: 1 and Treatment may vary due to age or medical condition  Airway Management Planned: Simple Face Mask and Nasal Cannula  Additional Equipment:   Intra-op Plan:   Post-operative Plan:   Informed Consent: I have reviewed the patients History and Physical, chart, labs and discussed the procedure including the risks, benefits and alternatives for the proposed anesthesia with the patient or authorized representative who has indicated his/her understanding and  acceptance.     Dental advisory given  Plan Discussed with: CRNA  Anesthesia Plan Comments: (Plan MAC -second eye -states doesn't think he had much last time  Plan Full PPE use )        Anesthesia Quick Evaluation

## 2018-08-20 NOTE — Op Note (Signed)
Date of procedure: 08/20/18  Pre-operative diagnosis: Visually significant age-related cataract, Left Eye (H25.812)  Post-operative diagnosis: Visually significant age-related cataract, Left Eye  Procedure: Removal of cataract via phacoemulsification and insertion of intra-ocular lens [employer] PCB00  +22.0D into the capsular bag of the Left Eye  Attending surgeon: Gerda Diss. Sequoia Witz, MD, MA  Anesthesia: MAC, Topical [place]  Complications: None  Estimated Blood Loss: <49m (minimal)  Specimens: None  Implants: As above  Indications:  Visually significant age-related cataract, Left Eye  Procedure:  The patient was seen and identified in the pre-operative area. The operative eye was identified and dilated.  The operative eye was marked.  Topical anesthesia was administered to the operative eye.     The patient was then to the operative suite and placed in the supine position.  A timeout was performed confirming the patient, procedure to be performed, and all other relevant information.   The patient's face was prepped and draped in the usual fashion for intra-ocular surgery.  A lid speculum was placed into the operative eye and the surgical microscope moved into place and focused.  An inferotemporal paracentesis was created using a 20 gauge paracentesis blade.  Shugarcaine was injected into the anterior chamber.  Viscoelastic was injected into the anterior chamber.  A temporal clear-corneal main wound incision was created using a 2.477mmicrokeratome.  A continuous curvilinear capsulorrhexis was initiated using an irrigating cystitome and completed using capsulorrhexis forceps.  Hydrodissection and hydrodeliniation were performed.  Viscoelastic was injected into the anterior chamber.  A phacoemulsification handpiece and a chopper as a second instrument were used to remove the nucleus and epinucleus. The irrigation/aspiration handpiece was used to remove any remaining cortical  material.   The capsular bag was reinflated with viscoelastic, checked, and found to be intact.  The intraocular lens was inserted into the capsular bag and dialed into place using a Kuglen hook.  The irrigation/aspiration handpiece was used to remove any remaining viscoelastic.  The clear corneal wound and paracentesis wounds were then hydrated and checked with Weck-Cels to be watertight.  The lid-speculum and drape was removed, and the patient's face was cleaned with a wet and dry 4x4.  Maxitrol was instilled in the eye before a clear shield was taped over the eye. The patient was taken to the post-operative care unit in good condition, having tolerated the procedure well.  Post-Op Instructions: [facility] and will receive all other orders and instructions.

## 2018-08-20 NOTE — Transfer of Care (Signed)
Immediate Anesthesia Transfer of Care Note  Patient: Anthony Bullock  Procedure(s) Performed: CATARACT EXTRACTION PHACO AND INTRAOCULAR LENS PLACEMENT ([place]) (Left Eye)  Patient Location: Short Stay  Anesthesia Type:General  Level of Consciousness: awake  Airway & Oxygen Therapy: Patient Spontanous Breathing  Post-op Assessment: Report given to RN  Post vital signs: Reviewed  Last Vitals:  Vitals Value Taken Time  BP    Temp    Pulse    Resp    SpO2      Last Pain:  Vitals:   08/20/18 0722  TempSrc: Oral  PainSc: 0-No pain      Patients Stated Pain Goal: 6 (64/31/42 7670)  Complications: No apparent anesthesia complications

## 2018-08-20 NOTE — Interval H&P Note (Signed)
History and Physical Interval Note: The H and P was reviewed and updated. The patient was examined.  No changes were found after exam.  The surgical eye was marked.  08/20/2018 8:58 AM  Anthony Bullock  has presented today for surgery, with the diagnosis of nuclear cataract left eye.  The various methods of treatment have been discussed with the patient and family. After consideration of risks, benefits and other options for treatment, the patient has consented to  Procedure(s) with comments: CATARACT EXTRACTION PHACO AND INTRAOCULAR LENS PLACEMENT ([place]) (Left) - left as a surgical intervention.  The patient's history has been reviewed, patient examined, no change in status, stable for surgery.  I have reviewed the patient's chart and labs.  Questions were answered to the patient's satisfaction.     Baruch Goldmann

## 2018-08-21 ENCOUNTER — Encounter (HOSPITAL_COMMUNITY): Payer: Self-pay | Admitting: Ophthalmology

## 2018-08-21 DIAGNOSIS — D631 Anemia in chronic kidney disease: Secondary | ICD-10-CM | POA: Diagnosis not present

## 2018-08-21 DIAGNOSIS — N186 End stage renal disease: Secondary | ICD-10-CM | POA: Diagnosis not present

## 2018-08-21 DIAGNOSIS — N2581 Secondary hyperparathyroidism of renal origin: Secondary | ICD-10-CM | POA: Diagnosis not present

## 2018-08-21 DIAGNOSIS — Z992 Dependence on renal dialysis: Secondary | ICD-10-CM | POA: Diagnosis not present

## 2018-08-21 DIAGNOSIS — D509 Iron deficiency anemia, unspecified: Secondary | ICD-10-CM | POA: Diagnosis not present

## 2018-08-23 DIAGNOSIS — N186 End stage renal disease: Secondary | ICD-10-CM | POA: Diagnosis not present

## 2018-08-23 DIAGNOSIS — D509 Iron deficiency anemia, unspecified: Secondary | ICD-10-CM | POA: Diagnosis not present

## 2018-08-23 DIAGNOSIS — D631 Anemia in chronic kidney disease: Secondary | ICD-10-CM | POA: Diagnosis not present

## 2018-08-23 DIAGNOSIS — N2581 Secondary hyperparathyroidism of renal origin: Secondary | ICD-10-CM | POA: Diagnosis not present

## 2018-08-23 DIAGNOSIS — Z992 Dependence on renal dialysis: Secondary | ICD-10-CM | POA: Diagnosis not present

## 2018-08-25 DIAGNOSIS — Z992 Dependence on renal dialysis: Secondary | ICD-10-CM | POA: Diagnosis not present

## 2018-08-25 DIAGNOSIS — N186 End stage renal disease: Secondary | ICD-10-CM | POA: Diagnosis not present

## 2018-08-25 DIAGNOSIS — N2581 Secondary hyperparathyroidism of renal origin: Secondary | ICD-10-CM | POA: Diagnosis not present

## 2018-08-25 DIAGNOSIS — D509 Iron deficiency anemia, unspecified: Secondary | ICD-10-CM | POA: Diagnosis not present

## 2018-08-25 DIAGNOSIS — D631 Anemia in chronic kidney disease: Secondary | ICD-10-CM | POA: Diagnosis not present

## 2018-08-28 DIAGNOSIS — D631 Anemia in chronic kidney disease: Secondary | ICD-10-CM | POA: Diagnosis not present

## 2018-08-28 DIAGNOSIS — N186 End stage renal disease: Secondary | ICD-10-CM | POA: Diagnosis not present

## 2018-08-28 DIAGNOSIS — N2581 Secondary hyperparathyroidism of renal origin: Secondary | ICD-10-CM | POA: Diagnosis not present

## 2018-08-28 DIAGNOSIS — D509 Iron deficiency anemia, unspecified: Secondary | ICD-10-CM | POA: Diagnosis not present

## 2018-08-28 DIAGNOSIS — Z992 Dependence on renal dialysis: Secondary | ICD-10-CM | POA: Diagnosis not present

## 2018-08-30 DIAGNOSIS — Z992 Dependence on renal dialysis: Secondary | ICD-10-CM | POA: Diagnosis not present

## 2018-08-30 DIAGNOSIS — D509 Iron deficiency anemia, unspecified: Secondary | ICD-10-CM | POA: Diagnosis not present

## 2018-08-30 DIAGNOSIS — N186 End stage renal disease: Secondary | ICD-10-CM | POA: Diagnosis not present

## 2018-08-30 DIAGNOSIS — D631 Anemia in chronic kidney disease: Secondary | ICD-10-CM | POA: Diagnosis not present

## 2018-08-30 DIAGNOSIS — N2581 Secondary hyperparathyroidism of renal origin: Secondary | ICD-10-CM | POA: Diagnosis not present

## 2018-09-01 DIAGNOSIS — D631 Anemia in chronic kidney disease: Secondary | ICD-10-CM | POA: Diagnosis not present

## 2018-09-01 DIAGNOSIS — D509 Iron deficiency anemia, unspecified: Secondary | ICD-10-CM | POA: Diagnosis not present

## 2018-09-01 DIAGNOSIS — Z992 Dependence on renal dialysis: Secondary | ICD-10-CM | POA: Diagnosis not present

## 2018-09-01 DIAGNOSIS — N186 End stage renal disease: Secondary | ICD-10-CM | POA: Diagnosis not present

## 2018-09-01 DIAGNOSIS — N2581 Secondary hyperparathyroidism of renal origin: Secondary | ICD-10-CM | POA: Diagnosis not present

## 2018-09-03 ENCOUNTER — Other Ambulatory Visit: Payer: Self-pay | Admitting: Family Medicine

## 2018-09-04 DIAGNOSIS — N186 End stage renal disease: Secondary | ICD-10-CM | POA: Diagnosis not present

## 2018-09-04 DIAGNOSIS — N2581 Secondary hyperparathyroidism of renal origin: Secondary | ICD-10-CM | POA: Diagnosis not present

## 2018-09-04 DIAGNOSIS — D509 Iron deficiency anemia, unspecified: Secondary | ICD-10-CM | POA: Diagnosis not present

## 2018-09-04 DIAGNOSIS — D631 Anemia in chronic kidney disease: Secondary | ICD-10-CM | POA: Diagnosis not present

## 2018-09-04 DIAGNOSIS — Z992 Dependence on renal dialysis: Secondary | ICD-10-CM | POA: Diagnosis not present

## 2018-09-06 DIAGNOSIS — N186 End stage renal disease: Secondary | ICD-10-CM | POA: Diagnosis not present

## 2018-09-06 DIAGNOSIS — N2581 Secondary hyperparathyroidism of renal origin: Secondary | ICD-10-CM | POA: Diagnosis not present

## 2018-09-06 DIAGNOSIS — Z992 Dependence on renal dialysis: Secondary | ICD-10-CM | POA: Diagnosis not present

## 2018-09-06 DIAGNOSIS — D509 Iron deficiency anemia, unspecified: Secondary | ICD-10-CM | POA: Diagnosis not present

## 2018-09-06 DIAGNOSIS — D631 Anemia in chronic kidney disease: Secondary | ICD-10-CM | POA: Diagnosis not present

## 2018-09-08 DIAGNOSIS — N2581 Secondary hyperparathyroidism of renal origin: Secondary | ICD-10-CM | POA: Diagnosis not present

## 2018-09-08 DIAGNOSIS — D631 Anemia in chronic kidney disease: Secondary | ICD-10-CM | POA: Diagnosis not present

## 2018-09-08 DIAGNOSIS — Z992 Dependence on renal dialysis: Secondary | ICD-10-CM | POA: Diagnosis not present

## 2018-09-08 DIAGNOSIS — N186 End stage renal disease: Secondary | ICD-10-CM | POA: Diagnosis not present

## 2018-09-08 DIAGNOSIS — D509 Iron deficiency anemia, unspecified: Secondary | ICD-10-CM | POA: Diagnosis not present

## 2018-09-10 DIAGNOSIS — Z992 Dependence on renal dialysis: Secondary | ICD-10-CM | POA: Diagnosis not present

## 2018-09-10 DIAGNOSIS — N186 End stage renal disease: Secondary | ICD-10-CM | POA: Diagnosis not present

## 2018-09-11 DIAGNOSIS — D631 Anemia in chronic kidney disease: Secondary | ICD-10-CM | POA: Diagnosis not present

## 2018-09-11 DIAGNOSIS — N2581 Secondary hyperparathyroidism of renal origin: Secondary | ICD-10-CM | POA: Diagnosis not present

## 2018-09-11 DIAGNOSIS — Z992 Dependence on renal dialysis: Secondary | ICD-10-CM | POA: Diagnosis not present

## 2018-09-11 DIAGNOSIS — N186 End stage renal disease: Secondary | ICD-10-CM | POA: Diagnosis not present

## 2018-09-11 DIAGNOSIS — D509 Iron deficiency anemia, unspecified: Secondary | ICD-10-CM | POA: Diagnosis not present

## 2018-09-13 ENCOUNTER — Telehealth: Payer: Self-pay | Admitting: Family Medicine

## 2018-09-13 DIAGNOSIS — N2581 Secondary hyperparathyroidism of renal origin: Secondary | ICD-10-CM | POA: Diagnosis not present

## 2018-09-13 DIAGNOSIS — N186 End stage renal disease: Secondary | ICD-10-CM | POA: Diagnosis not present

## 2018-09-13 DIAGNOSIS — D509 Iron deficiency anemia, unspecified: Secondary | ICD-10-CM | POA: Diagnosis not present

## 2018-09-13 DIAGNOSIS — Z992 Dependence on renal dialysis: Secondary | ICD-10-CM | POA: Diagnosis not present

## 2018-09-13 DIAGNOSIS — D631 Anemia in chronic kidney disease: Secondary | ICD-10-CM | POA: Diagnosis not present

## 2018-09-13 NOTE — Telephone Encounter (Signed)
Patient is requesting PCP medication DeXcom g6 be sent to his [place], [place] - [address] [facility] [address] [phone] (Phone) [phone] (Fax   Patient call back [phone]

## 2018-09-14 NOTE — Telephone Encounter (Signed)
This is not a medication.  This is a glucose monitoring system.  OK to send.  Sounds like similar to freestyle libre

## 2018-09-14 NOTE — Telephone Encounter (Signed)
OK for me to send?

## 2018-09-15 DIAGNOSIS — Z992 Dependence on renal dialysis: Secondary | ICD-10-CM | POA: Diagnosis not present

## 2018-09-15 DIAGNOSIS — D631 Anemia in chronic kidney disease: Secondary | ICD-10-CM | POA: Diagnosis not present

## 2018-09-15 DIAGNOSIS — N186 End stage renal disease: Secondary | ICD-10-CM | POA: Diagnosis not present

## 2018-09-15 DIAGNOSIS — D509 Iron deficiency anemia, unspecified: Secondary | ICD-10-CM | POA: Diagnosis not present

## 2018-09-15 DIAGNOSIS — N2581 Secondary hyperparathyroidism of renal origin: Secondary | ICD-10-CM | POA: Diagnosis not present

## 2018-09-18 DIAGNOSIS — Z992 Dependence on renal dialysis: Secondary | ICD-10-CM | POA: Diagnosis not present

## 2018-09-18 DIAGNOSIS — N2581 Secondary hyperparathyroidism of renal origin: Secondary | ICD-10-CM | POA: Diagnosis not present

## 2018-09-18 DIAGNOSIS — D631 Anemia in chronic kidney disease: Secondary | ICD-10-CM | POA: Diagnosis not present

## 2018-09-18 DIAGNOSIS — N186 End stage renal disease: Secondary | ICD-10-CM | POA: Diagnosis not present

## 2018-09-18 DIAGNOSIS — D509 Iron deficiency anemia, unspecified: Secondary | ICD-10-CM | POA: Diagnosis not present

## 2018-09-20 DIAGNOSIS — N2581 Secondary hyperparathyroidism of renal origin: Secondary | ICD-10-CM | POA: Diagnosis not present

## 2018-09-20 DIAGNOSIS — D509 Iron deficiency anemia, unspecified: Secondary | ICD-10-CM | POA: Diagnosis not present

## 2018-09-20 DIAGNOSIS — D631 Anemia in chronic kidney disease: Secondary | ICD-10-CM | POA: Diagnosis not present

## 2018-09-20 DIAGNOSIS — N186 End stage renal disease: Secondary | ICD-10-CM | POA: Diagnosis not present

## 2018-09-20 DIAGNOSIS — Z992 Dependence on renal dialysis: Secondary | ICD-10-CM | POA: Diagnosis not present

## 2018-09-20 MED ORDER — DEXCOM G6 RECEIVER DEVI: 1.0000 | Freq: Every day | 1 refills | Status: DC

## 2018-09-20 MED ORDER — DEXCOM G6 SENSOR MISC: 1.0000 | Freq: Every day | 1 refills | Status: DC

## 2018-09-20 MED ORDER — DEXCOM G6 TRANSMITTER MISC: 1.0000 | Freq: Every day | 1 refills | Status: DC

## 2018-09-20 NOTE — Telephone Encounter (Signed)
Rx sent 

## 2018-09-22 DIAGNOSIS — N2581 Secondary hyperparathyroidism of renal origin: Secondary | ICD-10-CM | POA: Diagnosis not present

## 2018-09-22 DIAGNOSIS — D509 Iron deficiency anemia, unspecified: Secondary | ICD-10-CM | POA: Diagnosis not present

## 2018-09-22 DIAGNOSIS — D631 Anemia in chronic kidney disease: Secondary | ICD-10-CM | POA: Diagnosis not present

## 2018-09-22 DIAGNOSIS — N186 End stage renal disease: Secondary | ICD-10-CM | POA: Diagnosis not present

## 2018-09-22 DIAGNOSIS — Z992 Dependence on renal dialysis: Secondary | ICD-10-CM | POA: Diagnosis not present

## 2018-09-24 DIAGNOSIS — E8779 Other fluid overload: Secondary | ICD-10-CM | POA: Diagnosis not present

## 2018-09-24 DIAGNOSIS — Z992 Dependence on renal dialysis: Secondary | ICD-10-CM | POA: Diagnosis not present

## 2018-09-24 DIAGNOSIS — N186 End stage renal disease: Secondary | ICD-10-CM | POA: Diagnosis not present

## 2018-09-25 DIAGNOSIS — N186 End stage renal disease: Secondary | ICD-10-CM | POA: Diagnosis not present

## 2018-09-25 DIAGNOSIS — N2581 Secondary hyperparathyroidism of renal origin: Secondary | ICD-10-CM | POA: Diagnosis not present

## 2018-09-25 DIAGNOSIS — D509 Iron deficiency anemia, unspecified: Secondary | ICD-10-CM | POA: Diagnosis not present

## 2018-09-25 DIAGNOSIS — D631 Anemia in chronic kidney disease: Secondary | ICD-10-CM | POA: Diagnosis not present

## 2018-09-25 DIAGNOSIS — Z992 Dependence on renal dialysis: Secondary | ICD-10-CM | POA: Diagnosis not present

## 2018-09-26 ENCOUNTER — Telehealth: Payer: Self-pay | Admitting: Family Medicine

## 2018-09-26 NOTE — Telephone Encounter (Signed)
I don't recall changing this but there should be no problem with change of Gabapentin to 300 mg two po bid #360 for 90 day supply with one refill.

## 2018-09-26 NOTE — Telephone Encounter (Signed)
°  Relation to pt: self  Call back number: [phone] Pharmacy: [place], [place] - [address] [phone] (Phone) [phone] (Fax)     Reason for call:  Patient requesting gabapentin (NEURONTIN) 300 MG capsule, patient states frequency has to reflect 2 in the AM and 2 in the PM 90 day supply

## 2018-09-26 NOTE — Telephone Encounter (Signed)
Please see the dosage request. Is this dosage change OK? I do not see any current notes of this change?

## 2018-09-27 DIAGNOSIS — D631 Anemia in chronic kidney disease: Secondary | ICD-10-CM | POA: Diagnosis not present

## 2018-09-27 DIAGNOSIS — N2581 Secondary hyperparathyroidism of renal origin: Secondary | ICD-10-CM | POA: Diagnosis not present

## 2018-09-27 DIAGNOSIS — Z992 Dependence on renal dialysis: Secondary | ICD-10-CM | POA: Diagnosis not present

## 2018-09-27 DIAGNOSIS — D509 Iron deficiency anemia, unspecified: Secondary | ICD-10-CM | POA: Diagnosis not present

## 2018-09-27 DIAGNOSIS — N186 End stage renal disease: Secondary | ICD-10-CM | POA: Diagnosis not present

## 2018-09-28 ENCOUNTER — Other Ambulatory Visit: Payer: Self-pay

## 2018-09-28 MED ORDER — GABAPENTIN 300 MG PO CAPS: 600.0000 mg | ORAL_CAPSULE | Freq: Two times a day (BID) | ORAL | 1 refills | Status: DC

## 2018-09-28 NOTE — Telephone Encounter (Signed)
This medication has been sent to the [facility] with the correct dosage instructions.

## 2018-09-29 DIAGNOSIS — N2581 Secondary hyperparathyroidism of renal origin: Secondary | ICD-10-CM | POA: Diagnosis not present

## 2018-09-29 DIAGNOSIS — D631 Anemia in chronic kidney disease: Secondary | ICD-10-CM | POA: Diagnosis not present

## 2018-09-29 DIAGNOSIS — N186 End stage renal disease: Secondary | ICD-10-CM | POA: Diagnosis not present

## 2018-09-29 DIAGNOSIS — Z992 Dependence on renal dialysis: Secondary | ICD-10-CM | POA: Diagnosis not present

## 2018-09-29 DIAGNOSIS — D509 Iron deficiency anemia, unspecified: Secondary | ICD-10-CM | POA: Diagnosis not present

## 2018-10-02 DIAGNOSIS — D509 Iron deficiency anemia, unspecified: Secondary | ICD-10-CM | POA: Diagnosis not present

## 2018-10-02 DIAGNOSIS — Z992 Dependence on renal dialysis: Secondary | ICD-10-CM | POA: Diagnosis not present

## 2018-10-02 DIAGNOSIS — N186 End stage renal disease: Secondary | ICD-10-CM | POA: Diagnosis not present

## 2018-10-02 DIAGNOSIS — N2581 Secondary hyperparathyroidism of renal origin: Secondary | ICD-10-CM | POA: Diagnosis not present

## 2018-10-02 DIAGNOSIS — D631 Anemia in chronic kidney disease: Secondary | ICD-10-CM | POA: Diagnosis not present

## 2018-10-04 DIAGNOSIS — N2581 Secondary hyperparathyroidism of renal origin: Secondary | ICD-10-CM | POA: Diagnosis not present

## 2018-10-04 DIAGNOSIS — Z992 Dependence on renal dialysis: Secondary | ICD-10-CM | POA: Diagnosis not present

## 2018-10-04 DIAGNOSIS — D631 Anemia in chronic kidney disease: Secondary | ICD-10-CM | POA: Diagnosis not present

## 2018-10-04 DIAGNOSIS — Z794 Long term (current) use of insulin: Secondary | ICD-10-CM | POA: Diagnosis not present

## 2018-10-04 DIAGNOSIS — N186 End stage renal disease: Secondary | ICD-10-CM | POA: Diagnosis not present

## 2018-10-04 DIAGNOSIS — D509 Iron deficiency anemia, unspecified: Secondary | ICD-10-CM | POA: Diagnosis not present

## 2018-10-04 DIAGNOSIS — E119 Type 2 diabetes mellitus without complications: Secondary | ICD-10-CM | POA: Diagnosis not present

## 2018-10-06 DIAGNOSIS — D509 Iron deficiency anemia, unspecified: Secondary | ICD-10-CM | POA: Diagnosis not present

## 2018-10-06 DIAGNOSIS — N186 End stage renal disease: Secondary | ICD-10-CM | POA: Diagnosis not present

## 2018-10-06 DIAGNOSIS — D631 Anemia in chronic kidney disease: Secondary | ICD-10-CM | POA: Diagnosis not present

## 2018-10-06 DIAGNOSIS — N2581 Secondary hyperparathyroidism of renal origin: Secondary | ICD-10-CM | POA: Diagnosis not present

## 2018-10-06 DIAGNOSIS — Z992 Dependence on renal dialysis: Secondary | ICD-10-CM | POA: Diagnosis not present

## 2018-10-09 DIAGNOSIS — N2581 Secondary hyperparathyroidism of renal origin: Secondary | ICD-10-CM | POA: Diagnosis not present

## 2018-10-09 DIAGNOSIS — D631 Anemia in chronic kidney disease: Secondary | ICD-10-CM | POA: Diagnosis not present

## 2018-10-09 DIAGNOSIS — D509 Iron deficiency anemia, unspecified: Secondary | ICD-10-CM | POA: Diagnosis not present

## 2018-10-09 DIAGNOSIS — N186 End stage renal disease: Secondary | ICD-10-CM | POA: Diagnosis not present

## 2018-10-09 DIAGNOSIS — Z992 Dependence on renal dialysis: Secondary | ICD-10-CM | POA: Diagnosis not present

## 2018-10-10 DIAGNOSIS — N186 End stage renal disease: Secondary | ICD-10-CM | POA: Diagnosis not present

## 2018-10-10 DIAGNOSIS — Z992 Dependence on renal dialysis: Secondary | ICD-10-CM | POA: Diagnosis not present

## 2018-10-11 DIAGNOSIS — Z992 Dependence on renal dialysis: Secondary | ICD-10-CM | POA: Diagnosis not present

## 2018-10-11 DIAGNOSIS — N186 End stage renal disease: Secondary | ICD-10-CM | POA: Diagnosis not present

## 2018-10-11 DIAGNOSIS — D509 Iron deficiency anemia, unspecified: Secondary | ICD-10-CM | POA: Diagnosis not present

## 2018-10-11 DIAGNOSIS — Z23 Encounter for immunization: Secondary | ICD-10-CM | POA: Diagnosis not present

## 2018-10-11 DIAGNOSIS — D631 Anemia in chronic kidney disease: Secondary | ICD-10-CM | POA: Diagnosis not present

## 2018-10-11 DIAGNOSIS — N2581 Secondary hyperparathyroidism of renal origin: Secondary | ICD-10-CM | POA: Diagnosis not present

## 2018-10-13 DIAGNOSIS — N186 End stage renal disease: Secondary | ICD-10-CM | POA: Diagnosis not present

## 2018-10-13 DIAGNOSIS — D631 Anemia in chronic kidney disease: Secondary | ICD-10-CM | POA: Diagnosis not present

## 2018-10-13 DIAGNOSIS — Z992 Dependence on renal dialysis: Secondary | ICD-10-CM | POA: Diagnosis not present

## 2018-10-13 DIAGNOSIS — Z23 Encounter for immunization: Secondary | ICD-10-CM | POA: Diagnosis not present

## 2018-10-13 DIAGNOSIS — D509 Iron deficiency anemia, unspecified: Secondary | ICD-10-CM | POA: Diagnosis not present

## 2018-10-13 DIAGNOSIS — N2581 Secondary hyperparathyroidism of renal origin: Secondary | ICD-10-CM | POA: Diagnosis not present

## 2018-10-16 DIAGNOSIS — Z23 Encounter for immunization: Secondary | ICD-10-CM | POA: Diagnosis not present

## 2018-10-16 DIAGNOSIS — D509 Iron deficiency anemia, unspecified: Secondary | ICD-10-CM | POA: Diagnosis not present

## 2018-10-16 DIAGNOSIS — D631 Anemia in chronic kidney disease: Secondary | ICD-10-CM | POA: Diagnosis not present

## 2018-10-16 DIAGNOSIS — N186 End stage renal disease: Secondary | ICD-10-CM | POA: Diagnosis not present

## 2018-10-16 DIAGNOSIS — N2581 Secondary hyperparathyroidism of renal origin: Secondary | ICD-10-CM | POA: Diagnosis not present

## 2018-10-16 DIAGNOSIS — Z992 Dependence on renal dialysis: Secondary | ICD-10-CM | POA: Diagnosis not present

## 2018-10-18 DIAGNOSIS — Z992 Dependence on renal dialysis: Secondary | ICD-10-CM | POA: Diagnosis not present

## 2018-10-18 DIAGNOSIS — D631 Anemia in chronic kidney disease: Secondary | ICD-10-CM | POA: Diagnosis not present

## 2018-10-18 DIAGNOSIS — N186 End stage renal disease: Secondary | ICD-10-CM | POA: Diagnosis not present

## 2018-10-18 DIAGNOSIS — D509 Iron deficiency anemia, unspecified: Secondary | ICD-10-CM | POA: Diagnosis not present

## 2018-10-18 DIAGNOSIS — N2581 Secondary hyperparathyroidism of renal origin: Secondary | ICD-10-CM | POA: Diagnosis not present

## 2018-10-18 DIAGNOSIS — Z23 Encounter for immunization: Secondary | ICD-10-CM | POA: Diagnosis not present

## 2018-10-20 DIAGNOSIS — Z992 Dependence on renal dialysis: Secondary | ICD-10-CM | POA: Diagnosis not present

## 2018-10-20 DIAGNOSIS — D509 Iron deficiency anemia, unspecified: Secondary | ICD-10-CM | POA: Diagnosis not present

## 2018-10-20 DIAGNOSIS — D631 Anemia in chronic kidney disease: Secondary | ICD-10-CM | POA: Diagnosis not present

## 2018-10-20 DIAGNOSIS — N2581 Secondary hyperparathyroidism of renal origin: Secondary | ICD-10-CM | POA: Diagnosis not present

## 2018-10-20 DIAGNOSIS — Z23 Encounter for immunization: Secondary | ICD-10-CM | POA: Diagnosis not present

## 2018-10-20 DIAGNOSIS — N186 End stage renal disease: Secondary | ICD-10-CM | POA: Diagnosis not present

## 2018-10-23 DIAGNOSIS — Z992 Dependence on renal dialysis: Secondary | ICD-10-CM | POA: Diagnosis not present

## 2018-10-23 DIAGNOSIS — D631 Anemia in chronic kidney disease: Secondary | ICD-10-CM | POA: Diagnosis not present

## 2018-10-23 DIAGNOSIS — D509 Iron deficiency anemia, unspecified: Secondary | ICD-10-CM | POA: Diagnosis not present

## 2018-10-23 DIAGNOSIS — Z23 Encounter for immunization: Secondary | ICD-10-CM | POA: Diagnosis not present

## 2018-10-23 DIAGNOSIS — N186 End stage renal disease: Secondary | ICD-10-CM | POA: Diagnosis not present

## 2018-10-23 DIAGNOSIS — N2581 Secondary hyperparathyroidism of renal origin: Secondary | ICD-10-CM | POA: Diagnosis not present

## 2018-10-25 DIAGNOSIS — Z23 Encounter for immunization: Secondary | ICD-10-CM | POA: Diagnosis not present

## 2018-10-25 DIAGNOSIS — N2581 Secondary hyperparathyroidism of renal origin: Secondary | ICD-10-CM | POA: Diagnosis not present

## 2018-10-25 DIAGNOSIS — N186 End stage renal disease: Secondary | ICD-10-CM | POA: Diagnosis not present

## 2018-10-25 DIAGNOSIS — D509 Iron deficiency anemia, unspecified: Secondary | ICD-10-CM | POA: Diagnosis not present

## 2018-10-25 DIAGNOSIS — D631 Anemia in chronic kidney disease: Secondary | ICD-10-CM | POA: Diagnosis not present

## 2018-10-25 DIAGNOSIS — Z992 Dependence on renal dialysis: Secondary | ICD-10-CM | POA: Diagnosis not present

## 2018-10-27 DIAGNOSIS — D631 Anemia in chronic kidney disease: Secondary | ICD-10-CM | POA: Diagnosis not present

## 2018-10-27 DIAGNOSIS — D509 Iron deficiency anemia, unspecified: Secondary | ICD-10-CM | POA: Diagnosis not present

## 2018-10-27 DIAGNOSIS — Z992 Dependence on renal dialysis: Secondary | ICD-10-CM | POA: Diagnosis not present

## 2018-10-27 DIAGNOSIS — N186 End stage renal disease: Secondary | ICD-10-CM | POA: Diagnosis not present

## 2018-10-27 DIAGNOSIS — Z23 Encounter for immunization: Secondary | ICD-10-CM | POA: Diagnosis not present

## 2018-10-27 DIAGNOSIS — N2581 Secondary hyperparathyroidism of renal origin: Secondary | ICD-10-CM | POA: Diagnosis not present

## 2018-10-30 DIAGNOSIS — Z23 Encounter for immunization: Secondary | ICD-10-CM | POA: Diagnosis not present

## 2018-10-30 DIAGNOSIS — N186 End stage renal disease: Secondary | ICD-10-CM | POA: Diagnosis not present

## 2018-10-30 DIAGNOSIS — N2581 Secondary hyperparathyroidism of renal origin: Secondary | ICD-10-CM | POA: Diagnosis not present

## 2018-10-30 DIAGNOSIS — D509 Iron deficiency anemia, unspecified: Secondary | ICD-10-CM | POA: Diagnosis not present

## 2018-10-30 DIAGNOSIS — Z992 Dependence on renal dialysis: Secondary | ICD-10-CM | POA: Diagnosis not present

## 2018-10-30 DIAGNOSIS — D631 Anemia in chronic kidney disease: Secondary | ICD-10-CM | POA: Diagnosis not present

## 2018-11-01 DIAGNOSIS — Z992 Dependence on renal dialysis: Secondary | ICD-10-CM | POA: Diagnosis not present

## 2018-11-01 DIAGNOSIS — Z23 Encounter for immunization: Secondary | ICD-10-CM | POA: Diagnosis not present

## 2018-11-01 DIAGNOSIS — N2581 Secondary hyperparathyroidism of renal origin: Secondary | ICD-10-CM | POA: Diagnosis not present

## 2018-11-01 DIAGNOSIS — D631 Anemia in chronic kidney disease: Secondary | ICD-10-CM | POA: Diagnosis not present

## 2018-11-01 DIAGNOSIS — N186 End stage renal disease: Secondary | ICD-10-CM | POA: Diagnosis not present

## 2018-11-01 DIAGNOSIS — D509 Iron deficiency anemia, unspecified: Secondary | ICD-10-CM | POA: Diagnosis not present

## 2018-11-03 DIAGNOSIS — N2581 Secondary hyperparathyroidism of renal origin: Secondary | ICD-10-CM | POA: Diagnosis not present

## 2018-11-03 DIAGNOSIS — D509 Iron deficiency anemia, unspecified: Secondary | ICD-10-CM | POA: Diagnosis not present

## 2018-11-03 DIAGNOSIS — N186 End stage renal disease: Secondary | ICD-10-CM | POA: Diagnosis not present

## 2018-11-03 DIAGNOSIS — Z992 Dependence on renal dialysis: Secondary | ICD-10-CM | POA: Diagnosis not present

## 2018-11-03 DIAGNOSIS — Z23 Encounter for immunization: Secondary | ICD-10-CM | POA: Diagnosis not present

## 2018-11-03 DIAGNOSIS — D631 Anemia in chronic kidney disease: Secondary | ICD-10-CM | POA: Diagnosis not present

## 2018-11-06 DIAGNOSIS — D631 Anemia in chronic kidney disease: Secondary | ICD-10-CM | POA: Diagnosis not present

## 2018-11-06 DIAGNOSIS — N186 End stage renal disease: Secondary | ICD-10-CM | POA: Diagnosis not present

## 2018-11-06 DIAGNOSIS — N2581 Secondary hyperparathyroidism of renal origin: Secondary | ICD-10-CM | POA: Diagnosis not present

## 2018-11-06 DIAGNOSIS — Z23 Encounter for immunization: Secondary | ICD-10-CM | POA: Diagnosis not present

## 2018-11-06 DIAGNOSIS — Z992 Dependence on renal dialysis: Secondary | ICD-10-CM | POA: Diagnosis not present

## 2018-11-06 DIAGNOSIS — D509 Iron deficiency anemia, unspecified: Secondary | ICD-10-CM | POA: Diagnosis not present

## 2018-11-08 DIAGNOSIS — Z992 Dependence on renal dialysis: Secondary | ICD-10-CM | POA: Diagnosis not present

## 2018-11-08 DIAGNOSIS — N186 End stage renal disease: Secondary | ICD-10-CM | POA: Diagnosis not present

## 2018-11-08 DIAGNOSIS — Z23 Encounter for immunization: Secondary | ICD-10-CM | POA: Diagnosis not present

## 2018-11-08 DIAGNOSIS — D631 Anemia in chronic kidney disease: Secondary | ICD-10-CM | POA: Diagnosis not present

## 2018-11-08 DIAGNOSIS — D509 Iron deficiency anemia, unspecified: Secondary | ICD-10-CM | POA: Diagnosis not present

## 2018-11-08 DIAGNOSIS — N2581 Secondary hyperparathyroidism of renal origin: Secondary | ICD-10-CM | POA: Diagnosis not present

## 2018-11-10 DIAGNOSIS — D509 Iron deficiency anemia, unspecified: Secondary | ICD-10-CM | POA: Diagnosis not present

## 2018-11-10 DIAGNOSIS — Z23 Encounter for immunization: Secondary | ICD-10-CM | POA: Diagnosis not present

## 2018-11-10 DIAGNOSIS — Z992 Dependence on renal dialysis: Secondary | ICD-10-CM | POA: Diagnosis not present

## 2018-11-10 DIAGNOSIS — D631 Anemia in chronic kidney disease: Secondary | ICD-10-CM | POA: Diagnosis not present

## 2018-11-10 DIAGNOSIS — N186 End stage renal disease: Secondary | ICD-10-CM | POA: Diagnosis not present

## 2018-11-10 DIAGNOSIS — N2581 Secondary hyperparathyroidism of renal origin: Secondary | ICD-10-CM | POA: Diagnosis not present

## 2018-11-13 DIAGNOSIS — N2581 Secondary hyperparathyroidism of renal origin: Secondary | ICD-10-CM | POA: Diagnosis not present

## 2018-11-13 DIAGNOSIS — Z992 Dependence on renal dialysis: Secondary | ICD-10-CM | POA: Diagnosis not present

## 2018-11-13 DIAGNOSIS — D631 Anemia in chronic kidney disease: Secondary | ICD-10-CM | POA: Diagnosis not present

## 2018-11-13 DIAGNOSIS — N186 End stage renal disease: Secondary | ICD-10-CM | POA: Diagnosis not present

## 2018-11-13 DIAGNOSIS — D509 Iron deficiency anemia, unspecified: Secondary | ICD-10-CM | POA: Diagnosis not present

## 2018-11-15 DIAGNOSIS — D509 Iron deficiency anemia, unspecified: Secondary | ICD-10-CM | POA: Diagnosis not present

## 2018-11-15 DIAGNOSIS — Z992 Dependence on renal dialysis: Secondary | ICD-10-CM | POA: Diagnosis not present

## 2018-11-15 DIAGNOSIS — N186 End stage renal disease: Secondary | ICD-10-CM | POA: Diagnosis not present

## 2018-11-15 DIAGNOSIS — D631 Anemia in chronic kidney disease: Secondary | ICD-10-CM | POA: Diagnosis not present

## 2018-11-15 DIAGNOSIS — N2581 Secondary hyperparathyroidism of renal origin: Secondary | ICD-10-CM | POA: Diagnosis not present

## 2018-11-17 DIAGNOSIS — N2581 Secondary hyperparathyroidism of renal origin: Secondary | ICD-10-CM | POA: Diagnosis not present

## 2018-11-17 DIAGNOSIS — N186 End stage renal disease: Secondary | ICD-10-CM | POA: Diagnosis not present

## 2018-11-17 DIAGNOSIS — D631 Anemia in chronic kidney disease: Secondary | ICD-10-CM | POA: Diagnosis not present

## 2018-11-17 DIAGNOSIS — Z992 Dependence on renal dialysis: Secondary | ICD-10-CM | POA: Diagnosis not present

## 2018-11-17 DIAGNOSIS — D509 Iron deficiency anemia, unspecified: Secondary | ICD-10-CM | POA: Diagnosis not present

## 2018-11-19 ENCOUNTER — Other Ambulatory Visit: Payer: Self-pay

## 2018-11-19 ENCOUNTER — Ambulatory Visit (INDEPENDENT_AMBULATORY_CARE_PROVIDER_SITE_OTHER): Payer: Medicare Other | Admitting: Orthopedic Surgery

## 2018-11-19 DIAGNOSIS — B351 Tinea unguium: Secondary | ICD-10-CM

## 2018-11-19 DIAGNOSIS — Z89511 Acquired absence of right leg below knee: Secondary | ICD-10-CM

## 2018-11-20 ENCOUNTER — Encounter: Payer: Self-pay | Admitting: Orthopedic Surgery

## 2018-11-20 DIAGNOSIS — D509 Iron deficiency anemia, unspecified: Secondary | ICD-10-CM | POA: Diagnosis not present

## 2018-11-20 DIAGNOSIS — N186 End stage renal disease: Secondary | ICD-10-CM | POA: Diagnosis not present

## 2018-11-20 DIAGNOSIS — Z992 Dependence on renal dialysis: Secondary | ICD-10-CM | POA: Diagnosis not present

## 2018-11-20 DIAGNOSIS — D631 Anemia in chronic kidney disease: Secondary | ICD-10-CM | POA: Diagnosis not present

## 2018-11-20 DIAGNOSIS — N2581 Secondary hyperparathyroidism of renal origin: Secondary | ICD-10-CM | POA: Diagnosis not present

## 2018-11-20 NOTE — Progress Notes (Signed)
Office Visit Note   Patient: Anthony Bullock           Date of Birth: [birth year]           MRN: [id number] Visit Date: 11/19/2018              Requested by: [name], MD [place],  [place] [address] PCP: [name], MD  Chief Complaint  Patient presents with  . Left Foot - Nail Problem  . Right Leg - Follow-up    BKA      HPI: Patient is a 58 year old gentleman who was seen for evaluation for right transtibial amputation fabricated by [employer] patient states that his liner socks and sleeves have worn out he states he does not have a good fit at this time.  Patient also complains of onychomycotic nails of the left foot.  Assessment & Plan: Visit Diagnoses:  1. Onychomycosis   2. Status post unilateral below knee amputation, right (HCC)     Plan: Nails were trimmed x5 on the left foot.  Patient was given a prescription for [employer] for new socket liner materials and supplies.  Follow-Up Instructions: Return in about 3 months (around 02/19/2019).   Ortho Exam  Patient is alert, oriented, no adenopathy, well-dressed, normal affect, normal respiratory effort. Examination patient has tearing and wearing out of the liner of the right transtibial amputation there is no skin breakdown or ulcers.  Left foot patient has no ulcers he does have thickened discolored onychomycotic nails x5 he is unable to safely trim the nails on his own due to diabetic insensate neuropathy and nails were trimmed x5 without complication.  Imaging: No results found. No images are attached to the encounter.  Labs: Lab Results  Component Value Date   HGBA1C 8.9 (H) 08/06/2018   HGBA1C 11.6 02/15/2018   HGBA1C 10.7 (A) 11/07/2017   ESRSEDRATE 94 (H) 03/30/2016   ESRSEDRATE 18 11/30/2012   CRP 16.1 (H) 03/30/2016   REPTSTATUS 04/05/2016 FINAL 03/31/2016   REPTSTATUS 04/04/2016 FINAL 03/31/2016   GRAMSTAIN  03/31/2016    RARE WBC PRESENT, PREDOMINANTLY PMN NO  ORGANISMS SEEN    CULT NO ANAEROBES ISOLATED 03/31/2016   CULT  03/31/2016    RARE STENOTROPHOMONAS MALTOPHILIA RARE ENTEROBACTER SPECIES    LABORGA STENOTROPHOMONAS MALTOPHILIA 03/31/2016   LABORGA ENTEROBACTER SPECIES 03/31/2016     Lab Results  Component Value Date   ALBUMIN 3.2 (L) 01/25/2017   ALBUMIN 2.5 (L) 04/27/2016   ALBUMIN 2.3 (L) 04/04/2016    Lab Results  Component Value Date   MG 2.1 12/03/2014   MG 2.1 11/04/2014   MG 2.1 03/30/2012   No results found for: VD25OH  No results found for: PREALBUMIN CBC EXTENDED Latest Ref Rng & Units 08/06/2018 11/06/2017 04/27/2016  WBC 4.0 - 10.5 K/uL 7.3 9.0 7.8  RBC 4.22 - 5.81 MIL/uL 4.32 - 3.36(L)  HGB 13.0 - 17.0 g/dL 12.6(L) 13.8 8.7(L)  HCT 39.0 - 52.0 % 40.4 41 27.7(L)  PLT 150 - 400 K/uL 153 131(A) 238  NEUTROABS 1.7 - 7.7 K/uL 4.7 7 -  LYMPHSABS 0.7 - 4.0 K/uL 1.6 - -     There is no height or weight on file to calculate BMI.  Orders:  No orders of the defined types were placed in this encounter.  No orders of the defined types were placed in this encounter.    Procedures: No procedures performed  Clinical Data: No additional findings.  ROS:  All other systems negative, except as noted in the HPI. Review of Systems  Objective: Vital Signs: There were no vitals taken for this visit.  Specialty Comments:  No specialty comments available.  PMFS History: Patient Active Problem List   Diagnosis Date Noted  . Onychomycosis 08/16/2016  . Status post unilateral below knee amputation, right ([place]) 04/26/2016  . Bilateral carotid bruits 03/30/2016  . Diabetes mellitus with complication ([place])   . Anemia due to chronic kidney disease   . ESRD (end stage renal disease) on dialysis ([place]) 11/30/2015  . Acute respiratory failure with hypoxia ([place]) 11/29/2015  . At high risk for falls 09/29/2014  . Peripheral vascular disease ([place]) 09/29/2014  . Osteoarthritis of both knees 01/07/2014  .  Osteoarthritis of multiple joints 10/06/2013  . NICM (nonischemic cardiomyopathy) ([employer]) 06/27/2013  . CAD (coronary artery disease) 03/13/2013  . Abnormal nuclear stress test 01/27/2013  . Chronic systolic congestive heart failure ([place]) 01/27/2013  . COPD (chronic obstructive pulmonary disease) ([place]) 12/27/2012  . Diabetic polyneuropathy associated with type 2 diabetes mellitus ([place]) 02/10/2012  . PYELITIS/PYELONEPHRITIS DISEASES CLASSIFIED ELSW 09/28/2009  . URINARY CALCULUS 09/28/2009  . Adjustment disorder with depressed mood 08/21/2009  . Poorly controlled type II diabetes mellitus with renal complication ([place]) 66/59/9357  . Hyperlipidemia 04/14/2008  . Essential hypertension 04/14/2008  . ALLERGIC RHINITIS 04/14/2008   Past Medical History:  Diagnosis Date  . 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 Allergic rhinitis   . Anemia   . Arthritis   . Chronic systolic heart failure ([place])    a. ECHO (12/2012) EF 25-30%, HK entireanteroseptal myocardium //  b.  EF 25%, diffuse HK, grade 1 diastolic dysfunction, MAC, mild LAE, normal RVSF, trivial pericardial effusion  . COPD (chronic obstructive pulmonary disease) ([place])   . Diabetes mellitus type II   . Diabetic nephropathy ([place])   . Diabetic neuropathy ([place])   . ESRD on hemodialysis [facility])    started HD June 2017, [facility] to [facility] HD unit, Dr Hinda Lenis  . History of cardiac catheterization    a.Myoview 1/15:  There is significant left ventricular dysfunction. There may be slight scar at the apex. There is no significant ischemia. LV Ejection Fraction: 27%  //  b. RHC/LHC (1/15) with mean RA 6, PA 47/22 mean 33, mean PCWP 20, PVR 2.5 WU, CI 2.5; 80% dLAD stenosis, 70% diffuse large D.   . History of kidney stones   . Hyperlipidemia   . Hypertension   . Kidney stones   . NICM (nonischemic cardiomyopathy) ([place])    Primarily nonischemic. Echo (12/14) with EF 25-30%. Echo (3/15) with EF 25%, mild  to moderately dilated LV, normal RV size and systolic function.   . Pneumonia   . Urethral stricture     Family History  Problem Relation Age of Onset  . Bladder Cancer Mother   . Alcohol abuse Father   . Melanoma Father   . Stroke Maternal Grandmother   . Heart Problems Maternal Grandmother        unknown  . Diabetes Maternal Grandmother   . Heart disease Maternal Grandfather   . Prostate cancer Maternal Grandfather     Past Surgical History:  Procedure Laterality Date  . ABDOMINAL AORTOGRAM W/LOWER EXTREMITY N/A 03/30/2016   Procedure: Abdominal Aortogram w/Lower Extremity;  Surgeon: Angelia Mould, MD;  Location: [place] CV LAB;  Service: Cardiovascular;  Laterality: N/A;  . AMPUTATION Right 04/26/2016   Procedure: Right Below Knee  Amputation;  Surgeon: Newt Minion, MD;  Location: [place];  Service: Orthopedics;  Laterality: Right;  . AV FISTULA PLACEMENT Right 09/08/2015   Procedure: INSERTION OF 4-22m x 45cm  ARTERIOVENOUS (AV) GORE-TEX GRAFT RIGHT UPPER  ARM;  Surgeon: CAngelia Mould MD;  [facility]  Service: Vascular;  Laterality: Right;  . AV FISTULA PLACEMENT Left 01/14/2016   Procedure: CREATION OF LEFT UPPER ARM ARTERIOVENOUS FISTULA;  Surgeon: CAngelia Mould MD;  [place] [facility]  Service: Vascular;  Laterality: Left;  . BASCILIC VEIN TRANSPOSITION Right 08/22/2014   Procedure: RIGHT UPPER ARM BASCILIC VEIN TRANSPOSITION;  Surgeon: CAngelia Mould MD;  [place] [facility]  Service: Vascular;  Laterality: Right;  . BELOW KNEE LEG AMPUTATION Right 04/26/2016  . CARDIAC CATHETERIZATION    . CARDIAC DEFIBRILLATOR PLACEMENT  06/27/2013   Sub Q       BY DR KCaryl Comes . CATARACT EXTRACTION W/PHACO Right 08/06/2018   Procedure: CATARACT EXTRACTION PHACO AND INTRAOCULAR LENS PLACEMENT (I[facility]);  Surgeon: WBaruch Goldmann MD;  Location: [facility];  Service: Ophthalmology;  Laterality: Right;  CDE: 4.06  . CATARACT EXTRACTION W/PHACO Left 08/20/2018   Procedure:  CATARACT EXTRACTION PHACO AND INTRAOCULAR LENS PLACEMENT ([facility]);  Surgeon: WBaruch Goldmann MD;  Location: [facility];  Service: Ophthalmology;  Laterality: Left;  CDE: 6.76  . COLONOSCOPY WITH PROPOFOL N/A 07/22/2015   Procedure: COLONOSCOPY WITH PROPOFOL;  Surgeon: HDoran Stabler MD;  [facility];  Service: Gastroenterology;  Laterality: N/A;  . FEMORAL-POPLITEAL BYPASS GRAFT Right 03/31/2016   Procedure: BYPASS GRAFT FEMORAL-POPLITEAL ARTERY USING RIGHT GREATER SAPHENOUS NONREVERSED VEIN;  Surgeon: CAngelia Mould MD;  [place] [facility]  Service: Vascular;  Laterality: Right;  . HERNIA REPAIR    . I&D EXTREMITY Right 03/31/2016   Procedure: IRRIGATION AND DEBRIDEMENT FOOT;  Surgeon: CAngelia Mould MD;  [place] [facility]  Service: Vascular;  Laterality: Right;  . IMPLANTABLE CARDIOVERTER DEFIBRILLATOR IMPLANT N/A 06/27/2013   Procedure: SUB Q ICD;  Surgeon: SDeboraha Sprang MD;  [facility];  Service: Cardiovascular;  Laterality: N/A;  . INTRAOPERATIVE ARTERIOGRAM Right 03/31/2016   Procedure: INTRA OPERATIVE ARTERIOGRAM;  Surgeon: CAngelia Mould MD;  [facility]  Service: Vascular;  Laterality: Right;  . IR GENERIC HISTORICAL Right 11/30/2015   IR THROMBECTOMY AV FISTULA W/THROMBOLYSIS/PTA INC/SHUNT/IMG RIGHT 11/30/2015 GAletta Edouard MD [facility]  . IR GENERIC HISTORICAL  11/30/2015   IR [place] ACCESS RIGHT 11/30/2015 GAletta Edouard MD [facility]  . IR GENERIC HISTORICAL Right 12/15/2015   IR THROMBECTOMY AV FISTULA W/THROMBOLYSIS/PTA/STENT INC/SHUNT/IMG RT 12/15/2015 DArne Cleveland MD [facility]  . IR GENERIC HISTORICAL  12/15/2015   IR [place] VASC ACCESS RIGHT 12/15/2015 DArne Cleveland MD [facility]  . IR GENERIC HISTORICAL  12/28/2015   IR FLUORO GUIDE CV LINE RIGHT 12/28/2015 AMarybelle Killings MD [facility]  . IR GENERIC HISTORICAL  12/28/2015   IR [place] VASC ACCESS RIGHT 12/28/2015 AMarybelle Killings MD [facility]  . LEFT A  ND RIGHT HEART CATH  01/30/2013   DR BSung Amabile . LEFT AND RIGHT HEART CATHETERIZATION WITH CORONARY ANGIOGRAM N/A 01/30/2013   Procedure: LEFT AND RIGHT HEART CATHETERIZATION WITH CORONARY ANGIOGRAM;  Surgeon: DJolaine Artist MD;  [facility];  Service: Cardiovascular;  Laterality: N/A;  . PERIPHERAL VASCULAR CATHETERIZATION Right 01/26/2015   Procedure: A/V Fistulagram;  Surgeon: CAngelia Mould MD;  [facility];  Service: Cardiovascular;  Laterality: Right;  . reapea urethral surgery for recurrent obstruction  2011  . TOTAL KNEE ARTHROPLASTY Right 2007  . VEIN HARVEST Right 03/31/2016   Procedure: RIGHT GREATER SAPHENOUS VEIN HARVEST;  Surgeon: Angelia Mould, MD;  Location: [place];  Service: Vascular;  Laterality: Right;   Social History   Occupational History  . Not on file  Tobacco Use  . Smoking status: Former Smoker    Packs/day: 2.00    Years: 32.00    Pack years: 64.00    Types: Cigarettes    Quit date: 05/11/2010    Years since quitting: 8.5  . Smokeless tobacco: Never Used  Substance and Sexual Activity  . Alcohol use: No  . Drug use: No  . Sexual activity: Yes

## 2018-11-22 DIAGNOSIS — D631 Anemia in chronic kidney disease: Secondary | ICD-10-CM | POA: Diagnosis not present

## 2018-11-22 DIAGNOSIS — Z992 Dependence on renal dialysis: Secondary | ICD-10-CM | POA: Diagnosis not present

## 2018-11-22 DIAGNOSIS — N2581 Secondary hyperparathyroidism of renal origin: Secondary | ICD-10-CM | POA: Diagnosis not present

## 2018-11-22 DIAGNOSIS — N186 End stage renal disease: Secondary | ICD-10-CM | POA: Diagnosis not present

## 2018-11-22 DIAGNOSIS — D509 Iron deficiency anemia, unspecified: Secondary | ICD-10-CM | POA: Diagnosis not present

## 2018-11-24 DIAGNOSIS — Z992 Dependence on renal dialysis: Secondary | ICD-10-CM | POA: Diagnosis not present

## 2018-11-24 DIAGNOSIS — D631 Anemia in chronic kidney disease: Secondary | ICD-10-CM | POA: Diagnosis not present

## 2018-11-24 DIAGNOSIS — D509 Iron deficiency anemia, unspecified: Secondary | ICD-10-CM | POA: Diagnosis not present

## 2018-11-24 DIAGNOSIS — N186 End stage renal disease: Secondary | ICD-10-CM | POA: Diagnosis not present

## 2018-11-24 DIAGNOSIS — N2581 Secondary hyperparathyroidism of renal origin: Secondary | ICD-10-CM | POA: Diagnosis not present

## 2018-11-27 ENCOUNTER — Other Ambulatory Visit: Payer: Self-pay | Admitting: Family Medicine

## 2018-11-27 DIAGNOSIS — D509 Iron deficiency anemia, unspecified: Secondary | ICD-10-CM | POA: Diagnosis not present

## 2018-11-27 DIAGNOSIS — D631 Anemia in chronic kidney disease: Secondary | ICD-10-CM | POA: Diagnosis not present

## 2018-11-27 DIAGNOSIS — Z992 Dependence on renal dialysis: Secondary | ICD-10-CM | POA: Diagnosis not present

## 2018-11-27 DIAGNOSIS — N186 End stage renal disease: Secondary | ICD-10-CM | POA: Diagnosis not present

## 2018-11-27 DIAGNOSIS — N2581 Secondary hyperparathyroidism of renal origin: Secondary | ICD-10-CM | POA: Diagnosis not present

## 2018-11-29 DIAGNOSIS — D509 Iron deficiency anemia, unspecified: Secondary | ICD-10-CM | POA: Diagnosis not present

## 2018-11-29 DIAGNOSIS — N2581 Secondary hyperparathyroidism of renal origin: Secondary | ICD-10-CM | POA: Diagnosis not present

## 2018-11-29 DIAGNOSIS — N186 End stage renal disease: Secondary | ICD-10-CM | POA: Diagnosis not present

## 2018-11-29 DIAGNOSIS — D631 Anemia in chronic kidney disease: Secondary | ICD-10-CM | POA: Diagnosis not present

## 2018-11-29 DIAGNOSIS — Z992 Dependence on renal dialysis: Secondary | ICD-10-CM | POA: Diagnosis not present

## 2018-12-01 DIAGNOSIS — D509 Iron deficiency anemia, unspecified: Secondary | ICD-10-CM | POA: Diagnosis not present

## 2018-12-01 DIAGNOSIS — N2581 Secondary hyperparathyroidism of renal origin: Secondary | ICD-10-CM | POA: Diagnosis not present

## 2018-12-01 DIAGNOSIS — D631 Anemia in chronic kidney disease: Secondary | ICD-10-CM | POA: Diagnosis not present

## 2018-12-01 DIAGNOSIS — N186 End stage renal disease: Secondary | ICD-10-CM | POA: Diagnosis not present

## 2018-12-01 DIAGNOSIS — Z992 Dependence on renal dialysis: Secondary | ICD-10-CM | POA: Diagnosis not present

## 2018-12-04 DIAGNOSIS — D631 Anemia in chronic kidney disease: Secondary | ICD-10-CM | POA: Diagnosis not present

## 2018-12-04 DIAGNOSIS — N2581 Secondary hyperparathyroidism of renal origin: Secondary | ICD-10-CM | POA: Diagnosis not present

## 2018-12-04 DIAGNOSIS — Z992 Dependence on renal dialysis: Secondary | ICD-10-CM | POA: Diagnosis not present

## 2018-12-04 DIAGNOSIS — N186 End stage renal disease: Secondary | ICD-10-CM | POA: Diagnosis not present

## 2018-12-04 DIAGNOSIS — D509 Iron deficiency anemia, unspecified: Secondary | ICD-10-CM | POA: Diagnosis not present

## 2018-12-06 DIAGNOSIS — N186 End stage renal disease: Secondary | ICD-10-CM | POA: Diagnosis not present

## 2018-12-06 DIAGNOSIS — Z992 Dependence on renal dialysis: Secondary | ICD-10-CM | POA: Diagnosis not present

## 2018-12-06 DIAGNOSIS — D509 Iron deficiency anemia, unspecified: Secondary | ICD-10-CM | POA: Diagnosis not present

## 2018-12-06 DIAGNOSIS — D631 Anemia in chronic kidney disease: Secondary | ICD-10-CM | POA: Diagnosis not present

## 2018-12-06 DIAGNOSIS — N2581 Secondary hyperparathyroidism of renal origin: Secondary | ICD-10-CM | POA: Diagnosis not present

## 2018-12-08 DIAGNOSIS — N2581 Secondary hyperparathyroidism of renal origin: Secondary | ICD-10-CM | POA: Diagnosis not present

## 2018-12-08 DIAGNOSIS — N186 End stage renal disease: Secondary | ICD-10-CM | POA: Diagnosis not present

## 2018-12-08 DIAGNOSIS — Z992 Dependence on renal dialysis: Secondary | ICD-10-CM | POA: Diagnosis not present

## 2018-12-08 DIAGNOSIS — D509 Iron deficiency anemia, unspecified: Secondary | ICD-10-CM | POA: Diagnosis not present

## 2018-12-08 DIAGNOSIS — D631 Anemia in chronic kidney disease: Secondary | ICD-10-CM | POA: Diagnosis not present

## 2018-12-10 DIAGNOSIS — N186 End stage renal disease: Secondary | ICD-10-CM | POA: Diagnosis not present

## 2018-12-10 DIAGNOSIS — Z992 Dependence on renal dialysis: Secondary | ICD-10-CM | POA: Diagnosis not present

## 2018-12-19 ENCOUNTER — Other Ambulatory Visit: Payer: Self-pay

## 2018-12-19 ENCOUNTER — Ambulatory Visit (INDEPENDENT_AMBULATORY_CARE_PROVIDER_SITE_OTHER): Payer: Medicare Other | Admitting: Physician Assistant

## 2018-12-19 VITALS — BP 187/67 | HR 82 | Temp 97.8°F | Resp 16 | Ht 70.0 in | Wt 210.0 lb

## 2018-12-19 DIAGNOSIS — N186 End stage renal disease: Secondary | ICD-10-CM

## 2018-12-19 DIAGNOSIS — Z992 Dependence on renal dialysis: Secondary | ICD-10-CM | POA: Diagnosis not present

## 2018-12-19 NOTE — Progress Notes (Signed)
Established Dialysis Access   History of Present Illness   Anthony Bullock is a 58 y.o. (12-Nov-1960) male who presents for evaluation of a knot in right arm in the area of a thrombosed AV graft.  Surgical history significant for transposed right basilic vein fistula in 8127 which failed.  He subsequently had right arm AV graft placed which also failed.  He has a left brachiocephalic fistula created in January 2018 that has never been accessed.  He was scheduled for fistulogram early in 2019 however this was canceled and he never followed up in clinic.  The knot on his right arm in the area of his thrombosed graft is not bothersome.  Patient states the only reason he is here is because the dialysis technicians are concerned with the appearance of the not and are avoiding taking a blood pressure in his right arm.  He denies any skin breakdown in this area or any systemic infection symptoms.  The patient has been using a [place] for dialysis over the past 2 years.  He is aware that there is a concern for infection of TDC.  He is also aware that the next step is likely to place a left arm AV graft due to a nonmaturing fistula.  Patient would like to continue dialyzing with [facility] knowing the above.   S it also be noted that surgical history is significant for right BKA.  The patient's PMH, PSH, SH, and FamHx were reviewed and are unchanged from prior visit.  Current Outpatient Medications  Medication Sig Dispense Refill  . albuterol (PROVENTIL HFA;VENTOLIN HFA) 108 (90 Base) MCG/ACT inhaler Inhale 2 puffs into the lungs every 4 (four) hours as needed for wheezing or shortness of breath. 1 Inhaler 1  . aspirin EC 81 MG tablet Take 81 mg by mouth daily.     [name] atorvastatin (LIPITOR) 80 MG tablet Take 1 tablet (80 mg total) by mouth at bedtime. 90 tablet 3  . azelastine (ASTELIN) 0.1 % nasal spray Place 2 sprays into both nostrils 3 (three) times daily as needed for rhinitis. Use in each nostril as directed 30 mL  12  . carvedilol (COREG) 12.5 MG tablet Take 1 tablet (12.5 mg total) by mouth 2 (two) times daily with a meal. 180 tablet 3  . Continuous Blood Gluc Receiver ([place]) DEVI 1 each by Does not apply route daily. 1 each 1  . Continuous Blood Gluc Sensor (DEXCOM G6 SENSOR) MISC 1 each by Does not apply route daily. 3 each 1  . Continuous Blood Gluc Sensor (FREESTYLE LIBRE 14 DAY SENSOR) MISC Inject 1 Device into the skin every 14 (fourteen) days. 2 each 3  . Continuous Blood Gluc Transmit (DEXCOM G6 TRANSMITTER) MISC 1 each by Does not apply route daily. 1 each 1  . fexofenadine (ALLEGRA) 180 MG tablet Take 180 mg by mouth daily.    . furosemide (LASIX) 40 MG tablet Take 3 tablets by mouth once daily 270 tablet 0  . gabapentin (NEURONTIN) 300 MG capsule Take 2 capsules (600 mg total) by mouth 2 (two) times daily. 360 capsule 1  . glucose blood test strip Check 1 time daily. E11.9 One Touch Ultra Blue Test Strips 100 each 3  . Insulin Glargine (BASAGLAR KWIKPEN) 100 UNIT/ML SOPN INJECT 70 UNITS SUBCUTANEOUSLY AT BEDTIME 15 mL 0  . Insulin Lispro (HUMALOG KWIKPEN) 200 UNIT/ML SOPN Inject 10 Units into the skin 3 (three) times daily with meals. Use tid with meals as  per sliding scale 4 pen 6  . Insulin Pen Needle (BD PEN NEEDLE NANO U/F) 32G X 4 MM MISC USE 1  ONCE DAILY 100 each 0  . lanthanum (FOSRENOL) 1000 MG chewable tablet Chew 1,000 mg by mouth 3 (three) times daily with meals. Take one tablet after each meal.     . multivitamin (RENA-VIT) TABS tablet Take 1 tablet by mouth daily.  99  . Olopatadine HCl (PATADAY) 0.2 % SOLN Place 1 drop into both eyes daily as needed (for allergies).      No current facility-administered medications for this visit.     On ROS today: 10 system ROS is negative unless otherwise noted in HPI   Physical Examination   Vitals:   12/19/18 1547 12/19/18 1550  BP: (!) 194/92 (!) 187/67  Pulse: 82 82  Resp: 16   Temp: 97.8 F (36.6 C)   TempSrc:  Temporal   SpO2: 99%   Weight: 210 lb (95.3 kg)   Height: 5\' 10"  (1.778 m)    Body mass index is 30.13 kg/m.  General Alert, O x 3, WD, NAD  Pulmonary Sym exp, good B air movt, CTA B  Cardiac RRR, Nl S1, S2  Vascular Vessel Right Left  Radial Palpable Palpable  Brachial Palpable Palpable  Ulnar Not palpable Not palpable    Musculo- skeletal M/S 5/5 throughout  , Extremities without ischemic changes; small 1cm firm knot on right arm AV graft; right arm graft is without pulsatility and is collapsed; no overlying skin breakdown or any sign of infection; pulsatile, small left brachiocephalic fistula    Neurologic A&O; CN grossly intact      Medical Decision Making   VIAAN KNIPPENBERG is a 58 y.o. male who presents with ESRD requiring hemodialysis.    Small, firm knot along collapsed right arm AV graft likely plaque versus thrombosed blood that has not been reabsorbed  No sign of infection; this is also not bothersome to the patient; right arm AV graft will not be excised unless graft material becomes infected  Okay to continue taking blood pressures from right arm  We also had a lengthy discussion about his current dialysis access being his right IJ TDC over the past 2 years.  The patient is unwilling to pursue permanent access in his left arm and is content with continuing dialysis via [place].  He is aware of the risk of infection and/or sepsis.  If he changes his mind he would like to follow-up with Dr. Scot Dock.  Patient will also need a duplex of his left arm brachiocephalic fistula at follow-up office visit to see if fistula is salvageable.  He may follow-up on an as-needed basis   Dagoberto Ligas PA-C [facility] Office: [phone]  Clinic MD: Oneida Alar

## 2018-12-21 ENCOUNTER — Other Ambulatory Visit: Payer: Self-pay | Admitting: Family Medicine

## 2018-12-27 NOTE — Progress Notes (Signed)
Cardiology Office Note Date:  12/27/2018  Patient ID:  Anthony Bullock, Anthony Bullock 21-Oct-1960, MRN [id number] PCP:  [name], MD  Cardiologist:  Dr. Harl Bowie Electrophysiologist: Dr. Caryl Comes Nephrology: Dr. Lowanda Foster    Chief Complaint:  annual device check  History of Present Illness: Anthony Bullock is a 58 y.o. male with history of NICM w/S-ICD, chronic CHF, systolic, ESERF on HD DM, HTN, CAD with LHC in 1/15 demonstrated probable diabetic vascular disease. Initial echo in 12/14 with EF 25-30%. most recent echo in 2018 was 55%, PVD w/R BKA  He comes to the office today to be seen for Dr. Caryl Comes. Last seen by him Dec 2019, this visit prompted by ICD shock (though this statement seems to be carried over from prior notes).  Seems he was doing well,  No changes were made.  Mentioned he may benefit/need Pro[facility] dialysis days 2/2 LH (?)   He saw [facility] 12/19/2018, R sided AV fistula and graft thrombosed and nonfunctioning chronically, L brachiocephalic fistula created in January 2018, patient never follow up afterwards until this month.   The patient has been using a [place] for dialysis over the past 2 years.  He is aware that there is a concern for infection of TDC.  He is also aware that the next step is likely to place a left arm AV graft due to a nonmaturing fistula.  Patient would like to continue dialyzing with [facility] knowing infection risk   He is doing well.  He has HD T-Th-Sat, his BP tends to drop with HD, and does not take his BP pills in the AM.  He denies any CP, palpitations, no rest SOB.  He denies difficulties with his ADLs.  He will feel a little bloated and easier. winded when he has been liberal with his diet, but otherwise denies SOB/DOE No dizzy spells, near syncope or syncope, no shocks  He tells me his PMD monitors and manages his cholestorol.    Device information: [employer], implanted 06/27/13    Past Medical History:  Diagnosis Date  . AICD (automatic  cardioverter/defibrillator) present    [employer]  . Allergic rhinitis   . Anemia   . Arthritis   . Chronic systolic heart failure ([place])    a. ECHO (12/2012) EF 25-30%, HK entireanteroseptal myocardium //  b.  EF 25%, diffuse HK, grade 1 diastolic dysfunction, MAC, mild LAE, normal RVSF, trivial pericardial effusion  . COPD (chronic obstructive pulmonary disease) ([place])   . Diabetes mellitus type II   . Diabetic nephropathy ([place])   . Diabetic neuropathy ([place])   . ESRD on hemodialysis [facility])    started HD June 2017, goes to [facility] HD unit, Dr Hinda Lenis  . History of cardiac catheterization    a.Myoview 1/15:  There is significant left ventricular dysfunction. There may be slight scar at the apex. There is no significant ischemia. LV Ejection Fraction: 27%  //  b. RHC/LHC (1/15) with mean RA 6, PA 47/22 mean 33, mean PCWP 20, PVR 2.5 WU, CI 2.5; 80% dLAD stenosis, 70% diffuse large D.   . History of kidney stones   . Hyperlipidemia   . Hypertension   . Kidney stones   . NICM (nonischemic cardiomyopathy) ([place])    Primarily nonischemic. Echo (12/14) with EF 25-30%. Echo (3/15) with EF 25%, mild to moderately dilated LV, normal RV size and systolic function.   . Pneumonia   . Urethral stricture     Past Surgical History:  Procedure Laterality Date  . ABDOMINAL AORTOGRAM W/LOWER EXTREMITY N/A 03/30/2016   Procedure: Abdominal Aortogram w/Lower Extremity;  Surgeon: Angelia Mould, MD;  Location: [facility];  Service: Cardiovascular;  Laterality: N/A;  . AMPUTATION Right 04/26/2016   Procedure: Right Below Knee Amputation;  Surgeon: Newt Minion, MD;  Location: [place] [facility];  Service: Orthopedics;  Laterality: Right;  . AV FISTULA PLACEMENT Right 09/08/2015   Procedure: INSERTION OF 4-42m x 45cm  ARTERIOVENOUS (AV) GORE-TEX GRAFT RIGHT UPPER  ARM;  Surgeon: CAngelia Mould MD;  Location: [facility]  Service: Vascular;  Laterality: Right;  . AV FISTULA PLACEMENT Left  01/14/2016   Procedure: CREATION OF LEFT UPPER ARM ARTERIOVENOUS FISTULA;  Surgeon: CAngelia Mould MD;  [place] [facility]  Service: Vascular;  Laterality: Left;  . BASCILIC VEIN TRANSPOSITION Right 08/22/2014   Procedure: RIGHT UPPER ARM BASCILIC VEIN TRANSPOSITION;  Surgeon: CAngelia Mould MD;  [facility]  Service: Vascular;  Laterality: Right;  . BELOW KNEE LEG AMPUTATION Right 04/26/2016  . CARDIAC CATHETERIZATION    . CARDIAC DEFIBRILLATOR PLACEMENT  06/27/2013   Sub Q       BY DR KCaryl Comes . CATARACT EXTRACTION W/PHACO Right 08/06/2018   Procedure: CATARACT EXTRACTION PHACO AND INTRAOCULAR LENS PLACEMENT (IOC);  Surgeon: WBaruch Goldmann MD;  Location: [facility];  Service: Ophthalmology;  Laterality: Right;  CDE: 4.06  . CATARACT EXTRACTION W/PHACO Left 08/20/2018   Procedure: CATARACT EXTRACTION PHACO AND INTRAOCULAR LENS PLACEMENT (IOC);  Surgeon: WBaruch Goldmann MD;  Location: [facility];  Service: Ophthalmology;  Laterality: Left;  CDE: 6.76  . COLONOSCOPY WITH PROPOFOL N/A 07/22/2015   Procedure: COLONOSCOPY WITH PROPOFOL;  Surgeon: HDoran Stabler MD;  [facility]: [facility];  Service: Gastroenterology;  Laterality: N/A;  . FEMORAL-POPLITEAL BYPASS GRAFT Right 03/31/2016   Procedure: BYPASS GRAFT FEMORAL-POPLITEAL ARTERY USING RIGHT GREATER SAPHENOUS NONREVERSED VEIN;  Surgeon: CAngelia Mould MD;  [facility]  Service: Vascular;  Laterality: Right;  . HERNIA REPAIR    . I&D EXTREMITY Right 03/31/2016   Procedure: IRRIGATION AND DEBRIDEMENT FOOT;  Surgeon: CAngelia Mould MD;  [place]: [facility]  Service: Vascular;  Laterality: Right;  . IMPLANTABLE CARDIOVERTER DEFIBRILLATOR IMPLANT N/A 06/27/2013   Procedure: SUB Q ICD;  Surgeon: SDeboraha Sprang MD;  [facility];  Service: Cardiovascular;  Laterality: N/A;  . INTRAOPERATIVE ARTERIOGRAM Right 03/31/2016   Procedure: INTRA OPERATIVE ARTERIOGRAM;  Surgeon: CAngelia Mould MD;  Location: [facility]   Service: Vascular;  Laterality: Right;  . IR GENERIC HISTORICAL Right 11/30/2015   IR THROMBECTOMY AV FISTULA W/THROMBOLYSIS/PTA INC/SHUNT/IMG RIGHT 11/30/2015 GAletta Edouard MD [facility]  . IR GENERIC HISTORICAL  11/30/2015   IR [place] ACCESS RIGHT 11/30/2015 GAletta Edouard MD [facility]  . IR GENERIC HISTORICAL Right 12/15/2015   IR THROMBECTOMY AV FISTULA W/THROMBOLYSIS/PTA/STENT INC/SHUNT/IMG RT 12/15/2015 DArne Cleveland MD [facility]  . IR GENERIC HISTORICAL  12/15/2015   IR [place] VASC ACCESS RIGHT 12/15/2015 DArne Cleveland MD [facility]  . IR GENERIC HISTORICAL  12/28/2015   IR FLUORO GUIDE CV LINE RIGHT 12/28/2015 AMarybelle Killings MD [facility]  . IR GENERIC HISTORICAL  12/28/2015   IR [place] VASC ACCESS RIGHT 12/28/2015 AMarybelle Killings MD [facility]  . LEFT A ND RIGHT HEART CATH  01/30/2013   DR BSung Amabile . LEFT AND RIGHT HEART CATHETERIZATION WITH CORONARY ANGIOGRAM N/A 01/30/2013   Procedure: LEFT AND RIGHT HEART CATHETERIZATION WITH  CORONARY ANGIOGRAM;  Surgeon: Jolaine Artist, MD;  Location: [facility];  Service: Cardiovascular;  Laterality: N/A;  . PERIPHERAL VASCULAR CATHETERIZATION Right 01/26/2015   Procedure: A/V Fistulagram;  Surgeon: Angelia Mould, MD;  Location: [place] [facility];  Service: Cardiovascular;  Laterality: Right;  . reapea urethral surgery for recurrent obstruction  2011  . TOTAL KNEE ARTHROPLASTY Right 2007  . VEIN HARVEST Right 03/31/2016   Procedure: RIGHT GREATER SAPHENOUS VEIN HARVEST;  Surgeon: Angelia Mould, MD;  Location: [facility] OR;  Service: Vascular;  Laterality: Right;    Current Outpatient Medications  Medication Sig Dispense Refill  . albuterol (PROVENTIL HFA;VENTOLIN HFA) 108 (90 Base) MCG/ACT inhaler Inhale 2 puffs into the lungs every 4 (four) hours as needed for wheezing or shortness of breath. 1 Inhaler 1  . aspirin EC 81 MG tablet Take 81 mg by mouth daily.     [name] atorvastatin (LIPITOR) 80 MG tablet  Take 1 tablet (80 mg total) by mouth at bedtime. 90 tablet 3  . azelastine (ASTELIN) 0.1 % nasal spray Place 2 sprays into both nostrils 3 (three) times daily as needed for rhinitis. Use in each nostril as directed 30 mL 12  . carvedilol (COREG) 12.5 MG tablet Take 1 tablet (12.5 mg total) by mouth 2 (two) times daily with a meal. 180 tablet 3  . Continuous Blood Gluc Receiver ([place]) DEVI 1 each by Does not apply route daily. 1 each 1  . Continuous Blood Gluc Sensor (DEXCOM G6 SENSOR) MISC 1 each by Does not apply route daily. 3 each 1  . Continuous Blood Gluc Sensor (FREESTYLE LIBRE 14 DAY SENSOR) MISC Inject 1 Device into the skin every 14 (fourteen) days. 2 each 3  . Continuous Blood Gluc Transmit (DEXCOM G6 TRANSMITTER) MISC 1 each by Does not apply route daily. 1 each 1  . fexofenadine (ALLEGRA) 180 MG tablet Take 180 mg by mouth daily.    . furosemide (LASIX) 40 MG tablet Take 3 tablets by mouth once daily 270 tablet 0  . gabapentin (NEURONTIN) 300 MG capsule Take 2 capsules (600 mg total) by mouth 2 (two) times daily. 360 capsule 1  . glucose blood test strip Check 1 time daily. E11.9 One Touch Ultra Blue Test Strips 100 each 3  . Insulin Glargine (BASAGLAR KWIKPEN) 100 UNIT/ML SOPN INJECT 70 UNITS SUBCUTANEOUSLY AT BEDTIME 15 mL 0  . Insulin Lispro (HUMALOG KWIKPEN) 200 UNIT/ML SOPN Inject 10 Units into the skin 3 (three) times daily with meals. Use tid with meals as per sliding scale 4 pen 6  . Insulin Pen Needle (BD PEN NEEDLE NANO U/F) 32G X 4 MM MISC USE 1 PEN NEEDLE SUBCUTANEOUSLY WITH INSULIN 4 TIMES DAILY 400 each 0  . lanthanum (FOSRENOL) 1000 MG chewable tablet Chew 1,000 mg by mouth 3 (three) times daily with meals. Take one tablet after each meal.     . multivitamin (RENA-VIT) TABS tablet Take 1 tablet by mouth daily.  99  . Olopatadine HCl (PATADAY) 0.2 % SOLN Place 1 drop into both eyes daily as needed (for allergies).      No current facility-administered  medications for this visit.    Allergies:   Morphine sulfate   Social History:  The patient  reports that he quit smoking about 8 years ago. His smoking use included cigarettes. He has a 64.00 pack-year smoking history. He has never used smokeless tobacco. He reports that he does not drink alcohol or use drugs.  Family History:  The patient's family history includes Alcohol abuse in his father; Bladder Cancer in his mother; Diabetes in his maternal grandmother; Heart Problems in his maternal grandmother; Heart disease in his maternal grandfather; Melanoma in his father; Prostate cancer in his maternal grandfather; Stroke in his maternal grandmother.  ROS:  Please see the history of present illness.   All other systems are reviewed and otherwise negative.   PHYSICAL EXAM:  VS:  There were no vitals taken for this visit. BMI: There is no height or weight on file to calculate BMI. Well nourished, well developed, in no acute distress  HEENT: normocephalic, atraumatic  Neck: no JVD, carotid bruits or masses Cardiac:  RRR; no significant murmurs, no rubs, or gallops Lungs:  CTA b/l, no wheezing, rhonchi or rales  R chest dialysis catheter Abd: soft, nontender MS: no deformity or atrophy Ext:R BKA with prsthesis, trace edema LLE Skin: warm and dry, no rash Neuro:  No gross deficits appreciated Psych: euthymic mood, full affect  S-ICD site is stable, no tethering or discomfort   EKG:  Done today and reviewed by myself shows SR 86bpm, nonspecific T chamges appears unchanged from prior ICD interrogation done today reviewed by myself :  Battery remaining 16% (in d/w industry, estimate is 1 year) Electrode impedance is OK No arrhythmias or therapies    09/09/2016: TTE Study Conclusions - Left ventricle: The cavity size was mildly dilated. Wall   thickness was increased increased in a pattern of mild to   moderate LVH. The estimated ejection fraction was 55%. Wall   motion was normal;  there were no regional wall motion   abnormalities. Doppler parameters are consistent with abnormal   left ventricular relaxation (grade 1 diastolic dysfunction). - Mitral valve: Mildly calcified annulus. There was trivial   regurgitation. - Right ventricle: Pacer wire or catheter noted in right ventricle. - Right atrium: Central venous pressure (est): 3 mm Hg. - Tricuspid valve: There was physiologic regurgitation. - Pulmonary arteries: Systolic pressure could not be accurately   estimated. - Pericardium, extracardiac: There was no pericardial effusion.  Impressions: - Mild to moderate LVH with mild chamber dilatation and LVEF 55%.   Grade 1 diastolic dysfunction. Mildly calcified mitral annulus   with trivial mitral regurgitation. Device wire present within the   right heart.    03/13/13: Echocardiogram Study Conclusions - Left ventricle: The cavity size was mildly to moderately dilated. Wall thickness was normal. The estimated ejection fraction was 25%. Diffuse hypokinesis. Doppler parameters are consistent with abnormal left ventricular relaxation (grade 1 diastolic dysfunction). - Aortic valve: There was no stenosis. - Mitral valve: Mildly calcified annulus. Trivial regurgitation. - Left atrium: The atrium was mildly dilated. - Right ventricle: The cavity size was normal. Systolic function was normal. - Pulmonary arteries: No complete TR doppler jet so unable to estimate PA systolic pressure. - Inferior vena cava: The vessel was normal in size; the respirophasic diameter changes were in the normal range (= 50%); findings are consistent with normal central venous pressure. - Pericardium, extracardiac: A trivial pericardial effusion was identified. Impressions: - Mild to moderate LV dilation with global hypokinesis, EF 25%. Normal RV size and systolic function. No significant valvular abnormalities.  01/30/13: R/L heart cath, Dr.  Haroldine Laws Assessment: 1. CAD with diffuse diabetic vasculopathy. Only high grade lesion is in small distal LAD 2. Well-compensated hemodynamics Plan/Discussion: He has diffuse diabetic vasculopathy but TIMI-3 flow throughout. Suspect he has NICM. Continue medical therapy. Will hydrate overnight and watch  renal function. Home in am if stable.  Recent Labs: 08/06/2018: BUN 68; Creatinine, Ser 10.48; Hemoglobin 12.6; Platelets 153; Potassium 4.8; Sodium 133  No results found for requested labs within last 8760 hours.   CrCl cannot be calculated (Patient's most recent lab result is older than the maximum 21 days allowed.).   Wt Readings from Last 3 Encounters:  12/19/18 210 lb (95.3 kg)  08/06/18 215 lb (97.5 kg)  03/16/18 206 lb 4.8 oz (93.6 kg)     Other studies reviewed: Additional studies/records reviewed today include: summarized above  ASSESSMENT AND PLAN:  1. Chronic systolic CHF  2. Nonischemic cardiomyopathy  3. S-ICD in place    Battery at 13%, approx 1 year    Volume management with HD    counseled on importance of dietary restrictions  Will see him back in 80mo     3. HTN      A little elevated though gets hypotensive with HD     No changes  4. CAD      No angina. Continue aspirin, statin, beta blocker.          Disposition:  6 months device check, sooner if needed.    Current medicines are reviewed at length with the patient today.  The patient did not have any concerns regarding medicines.    SHaywood Lasso PA-C 12/27/2018 8:21 AM     [facility] [address] [place] [phone])  [phone])

## 2018-12-28 ENCOUNTER — Ambulatory Visit (INDEPENDENT_AMBULATORY_CARE_PROVIDER_SITE_OTHER): Payer: Medicare Other | Admitting: Physician Assistant

## 2018-12-28 ENCOUNTER — Other Ambulatory Visit: Payer: Self-pay

## 2018-12-28 VITALS — BP 150/64 | HR 86 | Ht 70.0 in | Wt 210.0 lb

## 2018-12-28 DIAGNOSIS — I5022 Chronic systolic (congestive) heart failure: Secondary | ICD-10-CM | POA: Diagnosis not present

## 2018-12-28 DIAGNOSIS — I2510 Atherosclerotic heart disease of native coronary artery without angina pectoris: Secondary | ICD-10-CM

## 2018-12-28 DIAGNOSIS — I428 Other cardiomyopathies: Secondary | ICD-10-CM

## 2018-12-28 DIAGNOSIS — Z95810 Presence of automatic (implantable) cardiac defibrillator: Secondary | ICD-10-CM

## 2018-12-28 DIAGNOSIS — I10 Essential (primary) hypertension: Secondary | ICD-10-CM | POA: Diagnosis not present

## 2018-12-28 NOTE — Patient Instructions (Signed)
Medication Instructions:  Your physician recommends that you continue on your current medications as directed. Please refer to the Current Medication list given to you today.  *If you need a refill on your cardiac medications before your next appointment, please call your pharmacy*  Lab Work: [place]   If you have labs (blood work) drawn today and your tests are completely normal, you will receive your results only by: [name] MyChart Message (if you have MyChart) OR . A paper copy in the mail If you have any lab test that is abnormal or we need to change your treatment, we will call you to review the results.  Testing/Procedures: NONE ORDERED  TODAY  Follow-Up: At [facility], you and your health needs are our priority.  As part of our continuing mission to provide you with exceptional heart care, we have created designated Provider Care Teams.  These Care Teams include your primary Cardiologist (physician) and Advanced Practice Providers (APPs -  Physician Assistants and Nurse Practitioners) who all work together to provide you with the care you need, when you need it.  Your next appointment:   6 month(s)  The format for your next appointment:   In Person  Provider:   You may see Dr. Caryl Comes  or one of the following Advanced Practice Providers on your designated Care Team:    Chanetta Marshall, NP  Tommye Standard, PA-C  Legrand Como "Oda Kilts, [place]   Other Instructions

## 2019-01-09 ENCOUNTER — Other Ambulatory Visit: Payer: Self-pay | Admitting: Family Medicine

## 2019-01-12 DIAGNOSIS — Z992 Dependence on renal dialysis: Secondary | ICD-10-CM | POA: Diagnosis not present

## 2019-01-12 DIAGNOSIS — N2581 Secondary hyperparathyroidism of renal origin: Secondary | ICD-10-CM | POA: Diagnosis not present

## 2019-01-12 DIAGNOSIS — N186 End stage renal disease: Secondary | ICD-10-CM | POA: Diagnosis not present

## 2019-01-12 DIAGNOSIS — D631 Anemia in chronic kidney disease: Secondary | ICD-10-CM | POA: Diagnosis not present

## 2019-01-12 DIAGNOSIS — D509 Iron deficiency anemia, unspecified: Secondary | ICD-10-CM | POA: Diagnosis not present

## 2019-01-15 DIAGNOSIS — N2581 Secondary hyperparathyroidism of renal origin: Secondary | ICD-10-CM | POA: Diagnosis not present

## 2019-01-15 DIAGNOSIS — N186 End stage renal disease: Secondary | ICD-10-CM | POA: Diagnosis not present

## 2019-01-15 DIAGNOSIS — D631 Anemia in chronic kidney disease: Secondary | ICD-10-CM | POA: Diagnosis not present

## 2019-01-15 DIAGNOSIS — Z992 Dependence on renal dialysis: Secondary | ICD-10-CM | POA: Diagnosis not present

## 2019-01-15 DIAGNOSIS — D509 Iron deficiency anemia, unspecified: Secondary | ICD-10-CM | POA: Diagnosis not present

## 2019-01-17 DIAGNOSIS — D509 Iron deficiency anemia, unspecified: Secondary | ICD-10-CM | POA: Diagnosis not present

## 2019-01-17 DIAGNOSIS — D631 Anemia in chronic kidney disease: Secondary | ICD-10-CM | POA: Diagnosis not present

## 2019-01-17 DIAGNOSIS — N186 End stage renal disease: Secondary | ICD-10-CM | POA: Diagnosis not present

## 2019-01-17 DIAGNOSIS — N2581 Secondary hyperparathyroidism of renal origin: Secondary | ICD-10-CM | POA: Diagnosis not present

## 2019-01-17 DIAGNOSIS — Z992 Dependence on renal dialysis: Secondary | ICD-10-CM | POA: Diagnosis not present

## 2019-01-19 DIAGNOSIS — N186 End stage renal disease: Secondary | ICD-10-CM | POA: Diagnosis not present

## 2019-01-19 DIAGNOSIS — Z992 Dependence on renal dialysis: Secondary | ICD-10-CM | POA: Diagnosis not present

## 2019-01-19 DIAGNOSIS — N2581 Secondary hyperparathyroidism of renal origin: Secondary | ICD-10-CM | POA: Diagnosis not present

## 2019-01-19 DIAGNOSIS — D509 Iron deficiency anemia, unspecified: Secondary | ICD-10-CM | POA: Diagnosis not present

## 2019-01-19 DIAGNOSIS — D631 Anemia in chronic kidney disease: Secondary | ICD-10-CM | POA: Diagnosis not present

## 2019-01-22 DIAGNOSIS — Z992 Dependence on renal dialysis: Secondary | ICD-10-CM | POA: Diagnosis not present

## 2019-01-22 DIAGNOSIS — N186 End stage renal disease: Secondary | ICD-10-CM | POA: Diagnosis not present

## 2019-01-22 DIAGNOSIS — D631 Anemia in chronic kidney disease: Secondary | ICD-10-CM | POA: Diagnosis not present

## 2019-01-22 DIAGNOSIS — N2581 Secondary hyperparathyroidism of renal origin: Secondary | ICD-10-CM | POA: Diagnosis not present

## 2019-01-22 DIAGNOSIS — D509 Iron deficiency anemia, unspecified: Secondary | ICD-10-CM | POA: Diagnosis not present

## 2019-01-24 DIAGNOSIS — D509 Iron deficiency anemia, unspecified: Secondary | ICD-10-CM | POA: Diagnosis not present

## 2019-01-24 DIAGNOSIS — D631 Anemia in chronic kidney disease: Secondary | ICD-10-CM | POA: Diagnosis not present

## 2019-01-24 DIAGNOSIS — Z992 Dependence on renal dialysis: Secondary | ICD-10-CM | POA: Diagnosis not present

## 2019-01-24 DIAGNOSIS — N186 End stage renal disease: Secondary | ICD-10-CM | POA: Diagnosis not present

## 2019-01-24 DIAGNOSIS — N2581 Secondary hyperparathyroidism of renal origin: Secondary | ICD-10-CM | POA: Diagnosis not present

## 2019-01-25 ENCOUNTER — Ambulatory Visit: Payer: Medicare Other | Admitting: Cardiology

## 2019-01-26 DIAGNOSIS — D509 Iron deficiency anemia, unspecified: Secondary | ICD-10-CM | POA: Diagnosis not present

## 2019-01-26 DIAGNOSIS — N2581 Secondary hyperparathyroidism of renal origin: Secondary | ICD-10-CM | POA: Diagnosis not present

## 2019-01-26 DIAGNOSIS — D631 Anemia in chronic kidney disease: Secondary | ICD-10-CM | POA: Diagnosis not present

## 2019-01-26 DIAGNOSIS — N186 End stage renal disease: Secondary | ICD-10-CM | POA: Diagnosis not present

## 2019-01-26 DIAGNOSIS — Z992 Dependence on renal dialysis: Secondary | ICD-10-CM | POA: Diagnosis not present

## 2019-01-29 DIAGNOSIS — N186 End stage renal disease: Secondary | ICD-10-CM | POA: Diagnosis not present

## 2019-01-29 DIAGNOSIS — N2581 Secondary hyperparathyroidism of renal origin: Secondary | ICD-10-CM | POA: Diagnosis not present

## 2019-01-29 DIAGNOSIS — D509 Iron deficiency anemia, unspecified: Secondary | ICD-10-CM | POA: Diagnosis not present

## 2019-01-29 DIAGNOSIS — Z992 Dependence on renal dialysis: Secondary | ICD-10-CM | POA: Diagnosis not present

## 2019-01-29 DIAGNOSIS — D631 Anemia in chronic kidney disease: Secondary | ICD-10-CM | POA: Diagnosis not present

## 2019-01-31 DIAGNOSIS — Z992 Dependence on renal dialysis: Secondary | ICD-10-CM | POA: Diagnosis not present

## 2019-01-31 DIAGNOSIS — N186 End stage renal disease: Secondary | ICD-10-CM | POA: Diagnosis not present

## 2019-01-31 DIAGNOSIS — D631 Anemia in chronic kidney disease: Secondary | ICD-10-CM | POA: Diagnosis not present

## 2019-01-31 DIAGNOSIS — N2581 Secondary hyperparathyroidism of renal origin: Secondary | ICD-10-CM | POA: Diagnosis not present

## 2019-01-31 DIAGNOSIS — D509 Iron deficiency anemia, unspecified: Secondary | ICD-10-CM | POA: Diagnosis not present

## 2019-02-02 DIAGNOSIS — N186 End stage renal disease: Secondary | ICD-10-CM | POA: Diagnosis not present

## 2019-02-02 DIAGNOSIS — N2581 Secondary hyperparathyroidism of renal origin: Secondary | ICD-10-CM | POA: Diagnosis not present

## 2019-02-02 DIAGNOSIS — Z992 Dependence on renal dialysis: Secondary | ICD-10-CM | POA: Diagnosis not present

## 2019-02-02 DIAGNOSIS — D509 Iron deficiency anemia, unspecified: Secondary | ICD-10-CM | POA: Diagnosis not present

## 2019-02-02 DIAGNOSIS — D631 Anemia in chronic kidney disease: Secondary | ICD-10-CM | POA: Diagnosis not present

## 2019-02-04 ENCOUNTER — Encounter: Payer: Self-pay | Admitting: *Deleted

## 2019-02-04 ENCOUNTER — Encounter: Payer: Self-pay | Admitting: Cardiology

## 2019-02-04 ENCOUNTER — Ambulatory Visit (INDEPENDENT_AMBULATORY_CARE_PROVIDER_SITE_OTHER): Payer: Medicare Other | Admitting: Cardiology

## 2019-02-04 ENCOUNTER — Other Ambulatory Visit: Payer: Self-pay

## 2019-02-04 VITALS — BP 181/77 | HR 76 | Ht 70.0 in | Wt 218.0 lb

## 2019-02-04 DIAGNOSIS — I2510 Atherosclerotic heart disease of native coronary artery without angina pectoris: Secondary | ICD-10-CM | POA: Diagnosis not present

## 2019-02-04 DIAGNOSIS — I5022 Chronic systolic (congestive) heart failure: Secondary | ICD-10-CM | POA: Diagnosis not present

## 2019-02-04 DIAGNOSIS — E782 Mixed hyperlipidemia: Secondary | ICD-10-CM

## 2019-02-04 DIAGNOSIS — I10 Essential (primary) hypertension: Secondary | ICD-10-CM | POA: Diagnosis not present

## 2019-02-04 NOTE — Patient Instructions (Signed)

## 2019-02-04 NOTE — Progress Notes (Signed)
Clinical Summary Mr. Balke is a 59 y.o.male seen today for follow up of the following medical problems.    1. Chronic systolic HF/NICM - echo in 2015 LVEF 25%. Most recent echo 08/2015 LVEF has improved to 50-55%. - has had some of his CHF meds decreased due to low bp's on HD.  - has ICD   - no recent SOB/DOE. No recent LE edema - compliant with meds  08/2016 echo LVEF 55%. , grade I diasotlic function.  - no SOB or DOE - can have some swellign at times - compliant with meds - 12/2018 normal ICD check    2. ESRD - being considered for transplant at [facility] - still on HD. Ongoing issues with low bp's. Takes coreg after HD. Goes Tues Thr Sat   3. HTN - can have occasional low bp's during HD  4. CAD - cath 2015 with diffuse nonobstructive disease, worst lesion 80% small distal LAD.   - no chest pani.   5. Hyperlipidemia - Jan 2019 TC 103 TG 136 HDL 38 LDL 38  6. DM2 - 06/2015 HgbA1C 8.9 - followed by pcp  7. Carotid stenosis - followed by vascular.  - in 2019 mild bilateral disease  8. Diabetic foot ulcer - recent admit [facility]   Past Medical History:  Diagnosis Date  . 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 Allergic rhinitis   . Anemia   . Arthritis   . Chronic systolic heart failure ([place])    a. ECHO (12/2012) EF 25-30%, HK entireanteroseptal myocardium //  b.  EF 25%, diffuse HK, grade 1 diastolic dysfunction, MAC, mild LAE, normal RVSF, trivial pericardial effusion  . COPD (chronic obstructive pulmonary disease) ([place])   . Diabetes mellitus type II   . Diabetic nephropathy ([place])   . Diabetic neuropathy ([place])   . ESRD on hemodialysis [facility])    started HD June 2017, goes to [facility] HD unit, Dr Hinda Lenis  . History of cardiac catheterization    a.Myoview 1/15:  There is significant left ventricular dysfunction. There may be slight scar at the apex. There is no significant ischemia.  LV Ejection Fraction: 27%  //  b. RHC/LHC (1/15) with mean RA 6, PA 47/22 mean 33, mean PCWP 20, PVR 2.5 WU, CI 2.5; 80% dLAD stenosis, 70% diffuse large D.   . History of kidney stones   . Hyperlipidemia   . Hypertension   . Kidney stones   . NICM (nonischemic cardiomyopathy) ([place])    Primarily nonischemic. Echo (12/14) with EF 25-30%. Echo (3/15) with EF 25%, mild to moderately dilated LV, normal RV size and systolic function.   . Pneumonia   . Urethral stricture      Allergies  Allergen Reactions  . Morphine Sulfate Rash and Other (See Comments)    Itches all over, red spots     Current Outpatient Medications  Medication Sig Dispense Refill  . albuterol (PROVENTIL HFA;VENTOLIN HFA) 108 (90 Base) MCG/ACT inhaler Inhale 2 puffs into the lungs every 4 (four) hours as needed for wheezing or shortness of breath. 1 Inhaler 1  . aspirin EC 81 MG tablet Take 81 mg by mouth daily.     [name] atorvastatin (LIPITOR) 80 MG tablet Take 1 tablet (80 mg total) by mouth at bedtime. 90 tablet 3  . azelastine (ASTELIN) 0.1 % nasal spray Place 2 sprays into both nostrils 3 (three) times daily as needed for rhinitis. Use  in each nostril as directed 30 mL 12  . carvedilol (COREG) 12.5 MG tablet Take 1 tablet (12.5 mg total) by mouth 2 (two) times daily with a meal. 180 tablet 3  . fenofibrate 160 MG tablet Take 160 mg by mouth daily.    . fexofenadine (ALLEGRA) 180 MG tablet Take 180 mg by mouth daily.    . furosemide (LASIX) 40 MG tablet Take 3 tablets by mouth once daily 270 tablet 0  . gabapentin (NEURONTIN) 300 MG capsule Take 2 capsules (600 mg total) by mouth 2 (two) times daily. (Patient taking differently: Take 900 mg by mouth 2 (two) times daily. ) 360 capsule 1  . Insulin Glargine (BASAGLAR KWIKPEN) 100 UNIT/ML SOPN Inject 0.7 mLs (70 Units total) into the skin at bedtime. PLEASE SCHEDULE FOLLOW UP FOR FURTHER REFILLS. 15 mL 0  . Insulin Lispro (HUMALOG KWIKPEN) 200 UNIT/ML SOPN Inject 10 Units  into the skin 3 (three) times daily with meals. Use tid with meals as per sliding scale 4 pen 6  . Insulin Pen Needle (BD PEN NEEDLE NANO U/F) 32G X 4 MM MISC USE 1 PEN NEEDLE SUBCUTANEOUSLY WITH INSULIN 4 TIMES DAILY 400 each 0  . lanthanum (FOSRENOL) 1000 MG chewable tablet Chew 1,000 mg by mouth 3 (three) times daily with meals. Take one tablet after each meal.     . multivitamin (RENA-VIT) TABS tablet Take 1 tablet by mouth daily.  99  . Olopatadine HCl (PATADAY) 0.2 % SOLN Place 1 drop into both eyes daily as needed (for allergies).     . Continuous Blood Gluc Receiver ([place]) DEVI 1 each by Does not apply route daily. 1 each 1  . Continuous Blood Gluc Sensor (DEXCOM G6 SENSOR) MISC 1 each by Does not apply route daily. 3 each 1  . Continuous Blood Gluc Sensor (FREESTYLE LIBRE 14 DAY SENSOR) MISC Inject 1 Device into the skin every 14 (fourteen) days. 2 each 3  . Continuous Blood Gluc Transmit (DEXCOM G6 TRANSMITTER) MISC 1 each by Does not apply route daily. 1 each 1  . glucose blood test strip Check 1 time daily. E11.9 One Touch Ultra Blue Test Strips 100 each 3   No current facility-administered medications for this visit.     Past Surgical History:  Procedure Laterality Date  . ABDOMINAL AORTOGRAM W/LOWER EXTREMITY N/A 03/30/2016   Procedure: Abdominal Aortogram w/Lower Extremity;  Surgeon: Angelia Mould, MD;  Location: [place] [facility];  Service: Cardiovascular;  Laterality: N/A;  . AMPUTATION Right 04/26/2016   Procedure: Right Below Knee Amputation;  Surgeon: Newt Minion, MD;  Location: [place];  Service: Orthopedics;  Laterality: Right;  . AV FISTULA PLACEMENT Right 09/08/2015   Procedure: INSERTION OF 4-81m x 45cm  ARTERIOVENOUS (AV) GORE-TEX GRAFT RIGHT UPPER  ARM;  Surgeon: CAngelia Mould MD;  [place]  Service: Vascular;  Laterality: Right;  . AV FISTULA PLACEMENT Left 01/14/2016   Procedure: CREATION OF LEFT UPPER ARM ARTERIOVENOUS FISTULA;   Surgeon: CAngelia Mould MD;  [place]  Service: Vascular;  Laterality: Left;  . BASCILIC VEIN TRANSPOSITION Right 08/22/2014   Procedure: RIGHT UPPER ARM BASCILIC VEIN TRANSPOSITION;  Surgeon: CAngelia Mould MD;  [place]  Service: Vascular;  Laterality: Right;  . BELOW KNEE LEG AMPUTATION Right 04/26/2016  . CARDIAC CATHETERIZATION    . CARDIAC DEFIBRILLATOR PLACEMENT  06/27/2013   Sub Q       BY DR KCaryl Comes .  CATARACT EXTRACTION W/PHACO Right 08/06/2018   Procedure: CATARACT EXTRACTION PHACO AND INTRAOCULAR LENS PLACEMENT (IOC);  Surgeon: Baruch Goldmann, MD;  [facility];  Service: Ophthalmology;  Laterality: Right;  CDE: 4.06  . CATARACT EXTRACTION W/PHACO Left 08/20/2018   Procedure: CATARACT EXTRACTION PHACO AND INTRAOCULAR LENS PLACEMENT (IOC);  Surgeon: Baruch Goldmann, MD;  [facility];  Service: Ophthalmology;  Laterality: Left;  CDE: 6.76  . COLONOSCOPY WITH PROPOFOL N/A 07/22/2015   Procedure: COLONOSCOPY WITH PROPOFOL;  Surgeon: Doran Stabler, MD;  [facility];  Service: Gastroenterology;  Laterality: N/A;  . FEMORAL-POPLITEAL BYPASS GRAFT Right 03/31/2016   Procedure: BYPASS GRAFT FEMORAL-POPLITEAL ARTERY USING RIGHT GREATER SAPHENOUS NONREVERSED VEIN;  Surgeon: Angelia Mould, MD;  Location: [place];  Service: Vascular;  Laterality: Right;  . HERNIA REPAIR    . I & D EXTREMITY Right 03/31/2016   Procedure: IRRIGATION AND DEBRIDEMENT FOOT;  Surgeon: Angelia Mould, MD;  Location: [place];  Service: Vascular;  Laterality: Right;  . IMPLANTABLE CARDIOVERTER DEFIBRILLATOR IMPLANT N/A 06/27/2013   Procedure: SUB Q ICD;  Surgeon: Deboraha Sprang, MD;  Location: [facility] CATH LAB;  Service: Cardiovascular;  Laterality: N/A;  . INTRAOPERATIVE ARTERIOGRAM Right 03/31/2016   Procedure: INTRA OPERATIVE ARTERIOGRAM;  Surgeon: Angelia Mould, MD;  Location: [place];  Service: Vascular;  Laterality: Right;  . IR GENERIC HISTORICAL Right  11/30/2015   IR THROMBECTOMY AV FISTULA W/THROMBOLYSIS/PTA INC/SHUNT/IMG RIGHT 11/30/2015 Aletta Edouard, MD [facility]  . IR GENERIC HISTORICAL  11/30/2015   IR US GUIDE VASC ACCESS RIGHT 11/30/2015 Aletta Edouard, MD [facility]  . IR GENERIC HISTORICAL Right 12/15/2015   IR THROMBECTOMY AV FISTULA W/THROMBOLYSIS/PTA/STENT INC/SHUNT/IMG RT 12/15/2015 Arne Cleveland, MD [facility]  . IR GENERIC HISTORICAL  12/15/2015   IR US GUIDE VASC ACCESS RIGHT 12/15/2015 Arne Cleveland, MD [facility]  . IR GENERIC HISTORICAL  12/28/2015   IR FLUORO GUIDE CV LINE RIGHT 12/28/2015 Marybelle Killings, MD [facility]  . IR GENERIC HISTORICAL  12/28/2015   IR US GUIDE VASC ACCESS RIGHT 12/28/2015 Marybelle Killings, MD [facility]  . LEFT A ND RIGHT HEART CATH  01/30/2013   DR Sung Amabile  . LEFT AND RIGHT HEART CATHETERIZATION WITH CORONARY ANGIOGRAM N/A 01/30/2013   Procedure: LEFT AND RIGHT HEART CATHETERIZATION WITH CORONARY ANGIOGRAM;  Surgeon: Jolaine Artist, MD;  Location: [facility] CATH LAB;  Service: Cardiovascular;  Laterality: N/A;  . PERIPHERAL VASCULAR CATHETERIZATION Right 01/26/2015   Procedure: A/V Fistulagram;  Surgeon: Angelia Mould, MD;  Location: [place] CV LAB;  Service: Cardiovascular;  Laterality: Right;  . reapea urethral surgery for recurrent obstruction  2011  . TOTAL KNEE ARTHROPLASTY Right 2007  . VEIN HARVEST Right 03/31/2016   Procedure: RIGHT GREATER SAPHENOUS VEIN HARVEST;  Surgeon: Angelia Mould, MD;  Location: [place];  Service: Vascular;  Laterality: Right;     Allergies  Allergen Reactions  . Morphine Sulfate Rash and Other (See Comments)    Itches all over, red spots      Family History  Problem Relation Age of Onset  . Bladder Cancer Mother   . Alcohol abuse Father   . Melanoma Father   . Stroke Maternal Grandmother   . Heart Problems Maternal Grandmother        unknown  . Diabetes Maternal Grandmother   . Heart disease Maternal Grandfather   .  Prostate cancer Maternal Grandfather      Social History Mr. Rueth reports that he quit  smoking about 8 years ago. His smoking use included cigarettes. He has a 64.00 pack-year smoking history. He has never used smokeless tobacco. Mr. Braniff reports no history of alcohol use.   Review of Systems CONSTITUTIONAL: No weight loss, fever, chills, weakness or fatigue.  HEENT: Eyes: No visual loss, blurred vision, double vision or yellow sclerae.No hearing loss, sneezing, congestion, runny nose or sore throat.  SKIN: No rash or itching.  CARDIOVASCULAR: per hpi RESPIRATORY: No shortness of breath, cough or sputum.  GASTROINTESTINAL: No anorexia, nausea, vomiting or diarrhea. No abdominal pain or blood.  GENITOURINARY: No burning on urination, no polyuria NEUROLOGICAL: No headache, dizziness, syncope, paralysis, ataxia, numbness or tingling in the extremities. No change in bowel or bladder control.  MUSCULOSKELETAL: No muscle, back pain, joint pain or stiffness.  LYMPHATICS: No enlarged nodes. No history of splenectomy.  PSYCHIATRIC: No history of depression or anxiety.  ENDOCRINOLOGIC: No reports of sweating, cold or heat intolerance. No polyuria or polydipsia.  [name]   Physical Examination Vitals:   02/04/19 1548  BP: (!) 181/77  Pulse: 76  SpO2: 100%   Filed Weights   02/04/19 1548  Weight: 218 lb (98.9 kg)    Gen: resting comfortably, no acute distress HEENT: no scleral icterus, pupils equal round and reactive, no palptable cervical adenopathy,  CV: RRR no mr/g, no jvd Resp: Clear to auscultation bilaterally GI: abdomen is soft, non-tender, non-distended, normal bowel sounds, no hepatosplenomegaly MSK: extremities are warm, no edema.  Skin: warm, no rash Neuro:  no focal deficits Psych: appropriate affect   Diagnostic Studies 08/2015 echo Study Conclusions  - Left ventricle: The cavity size was normal. Systolic function was normal. The estimated ejection fraction was  in the range of 50% to 55%. Wall motion was normal; there were no regional wall motion abnormalities. Doppler parameters are consistent with abnormal left ventricular relaxation (grade 1 diastolic dysfunction). - Left atrium: The atrium was mildly dilated. - Atrial septum: No defect or patent foramen ovale was identified.  Jan 2015 cath Findings:  RA = 6 RV = 48/5/7 PA = 47/22 (33) PCW = 20 Fick cardiac output/index = 5.2/2.5 PVR = 2.5 WU SVR = 1192 FA sat = 98% PA sat = 65%, 68%  Ao Pressure: 116/64 (84) LV Pressure: 122/13/18 There was no signficant gradient across the aortic valve on pullback.  Left main: Mild ostial tapering otherwise ok  LAD: Narrow vessel (due to diffuse diabetic vasculopathy). 30% ostial. 40-50% tubular lesion in the mid section. 80% lesion distally. Large diagonal with moderate diffuse disease and 70% lesion in midsection  LCX: Non dominant vessel with diffuse diabetic vasculopathy. Large OM-1. Small OM-2 and OM-3. 2 PLs. 30% mid AV groove CX lesion. 40% lesion in proximal OM-1  RCA: Large dominant vessel with just mild plaque  Assessment: 1. CAD with diffuse diabetic vasculopathy. Only high grade lesion is in small distal LAD 2. Well-compensated hemodynamics    Assessment and Plan  1. Chronic systolic HF - LVEF has now normalized - medical therapy has been limited due to low bp's during HD, as well as dizziness - continue current meds  2. HTN -manual recheck 135/75, continue current therapy.   3. CAD - nonobstructive disease by prior cath.  - no recent symptoms, continue to monitor.   4. Hyperlipidemia - he will continue statin  5. Carotid stenosis - followed by vascular   F/u 6 months       Arnoldo Lenis, M.D.

## 2019-02-05 DIAGNOSIS — D509 Iron deficiency anemia, unspecified: Secondary | ICD-10-CM | POA: Diagnosis not present

## 2019-02-05 DIAGNOSIS — N2581 Secondary hyperparathyroidism of renal origin: Secondary | ICD-10-CM | POA: Diagnosis not present

## 2019-02-05 DIAGNOSIS — N186 End stage renal disease: Secondary | ICD-10-CM | POA: Diagnosis not present

## 2019-02-05 DIAGNOSIS — D631 Anemia in chronic kidney disease: Secondary | ICD-10-CM | POA: Diagnosis not present

## 2019-02-05 DIAGNOSIS — Z992 Dependence on renal dialysis: Secondary | ICD-10-CM | POA: Diagnosis not present

## 2019-02-07 DIAGNOSIS — D509 Iron deficiency anemia, unspecified: Secondary | ICD-10-CM | POA: Diagnosis not present

## 2019-02-07 DIAGNOSIS — N2581 Secondary hyperparathyroidism of renal origin: Secondary | ICD-10-CM | POA: Diagnosis not present

## 2019-02-07 DIAGNOSIS — Z992 Dependence on renal dialysis: Secondary | ICD-10-CM | POA: Diagnosis not present

## 2019-02-07 DIAGNOSIS — N186 End stage renal disease: Secondary | ICD-10-CM | POA: Diagnosis not present

## 2019-02-07 DIAGNOSIS — D631 Anemia in chronic kidney disease: Secondary | ICD-10-CM | POA: Diagnosis not present

## 2019-02-09 DIAGNOSIS — N186 End stage renal disease: Secondary | ICD-10-CM | POA: Diagnosis not present

## 2019-02-09 DIAGNOSIS — D509 Iron deficiency anemia, unspecified: Secondary | ICD-10-CM | POA: Diagnosis not present

## 2019-02-09 DIAGNOSIS — D631 Anemia in chronic kidney disease: Secondary | ICD-10-CM | POA: Diagnosis not present

## 2019-02-09 DIAGNOSIS — Z992 Dependence on renal dialysis: Secondary | ICD-10-CM | POA: Diagnosis not present

## 2019-02-09 DIAGNOSIS — N2581 Secondary hyperparathyroidism of renal origin: Secondary | ICD-10-CM | POA: Diagnosis not present

## 2019-02-10 DIAGNOSIS — Z992 Dependence on renal dialysis: Secondary | ICD-10-CM | POA: Diagnosis not present

## 2019-02-10 DIAGNOSIS — N186 End stage renal disease: Secondary | ICD-10-CM | POA: Diagnosis not present

## 2019-02-12 DIAGNOSIS — N2581 Secondary hyperparathyroidism of renal origin: Secondary | ICD-10-CM | POA: Diagnosis not present

## 2019-02-12 DIAGNOSIS — N186 End stage renal disease: Secondary | ICD-10-CM | POA: Diagnosis not present

## 2019-02-12 DIAGNOSIS — Z992 Dependence on renal dialysis: Secondary | ICD-10-CM | POA: Diagnosis not present

## 2019-02-12 DIAGNOSIS — D631 Anemia in chronic kidney disease: Secondary | ICD-10-CM | POA: Diagnosis not present

## 2019-02-14 DIAGNOSIS — Z992 Dependence on renal dialysis: Secondary | ICD-10-CM | POA: Diagnosis not present

## 2019-02-14 DIAGNOSIS — N186 End stage renal disease: Secondary | ICD-10-CM | POA: Diagnosis not present

## 2019-02-14 DIAGNOSIS — N2581 Secondary hyperparathyroidism of renal origin: Secondary | ICD-10-CM | POA: Diagnosis not present

## 2019-02-14 DIAGNOSIS — D631 Anemia in chronic kidney disease: Secondary | ICD-10-CM | POA: Diagnosis not present

## 2019-02-16 DIAGNOSIS — N186 End stage renal disease: Secondary | ICD-10-CM | POA: Diagnosis not present

## 2019-02-16 DIAGNOSIS — D631 Anemia in chronic kidney disease: Secondary | ICD-10-CM | POA: Diagnosis not present

## 2019-02-16 DIAGNOSIS — Z992 Dependence on renal dialysis: Secondary | ICD-10-CM | POA: Diagnosis not present

## 2019-02-16 DIAGNOSIS — N2581 Secondary hyperparathyroidism of renal origin: Secondary | ICD-10-CM | POA: Diagnosis not present

## 2019-02-19 ENCOUNTER — Ambulatory Visit: Payer: Medicare Other | Admitting: Orthopedic Surgery

## 2019-02-19 DIAGNOSIS — N2581 Secondary hyperparathyroidism of renal origin: Secondary | ICD-10-CM | POA: Diagnosis not present

## 2019-02-19 DIAGNOSIS — N186 End stage renal disease: Secondary | ICD-10-CM | POA: Diagnosis not present

## 2019-02-19 DIAGNOSIS — R0602 Shortness of breath: Secondary | ICD-10-CM | POA: Diagnosis not present

## 2019-02-19 DIAGNOSIS — Z992 Dependence on renal dialysis: Secondary | ICD-10-CM | POA: Diagnosis not present

## 2019-02-19 DIAGNOSIS — I10 Essential (primary) hypertension: Secondary | ICD-10-CM | POA: Diagnosis not present

## 2019-02-19 DIAGNOSIS — D631 Anemia in chronic kidney disease: Secondary | ICD-10-CM | POA: Diagnosis not present

## 2019-02-19 DIAGNOSIS — E1165 Type 2 diabetes mellitus with hyperglycemia: Secondary | ICD-10-CM | POA: Diagnosis not present

## 2019-02-21 DIAGNOSIS — N186 End stage renal disease: Secondary | ICD-10-CM | POA: Diagnosis not present

## 2019-02-21 DIAGNOSIS — N2581 Secondary hyperparathyroidism of renal origin: Secondary | ICD-10-CM | POA: Diagnosis not present

## 2019-02-21 DIAGNOSIS — D631 Anemia in chronic kidney disease: Secondary | ICD-10-CM | POA: Diagnosis not present

## 2019-02-21 DIAGNOSIS — Z992 Dependence on renal dialysis: Secondary | ICD-10-CM | POA: Diagnosis not present

## 2019-02-23 DIAGNOSIS — N186 End stage renal disease: Secondary | ICD-10-CM | POA: Diagnosis not present

## 2019-02-23 DIAGNOSIS — N2581 Secondary hyperparathyroidism of renal origin: Secondary | ICD-10-CM | POA: Diagnosis not present

## 2019-02-23 DIAGNOSIS — D631 Anemia in chronic kidney disease: Secondary | ICD-10-CM | POA: Diagnosis not present

## 2019-02-23 DIAGNOSIS — Z992 Dependence on renal dialysis: Secondary | ICD-10-CM | POA: Diagnosis not present

## 2019-02-24 ENCOUNTER — Other Ambulatory Visit: Payer: Self-pay | Admitting: Family Medicine

## 2019-02-25 ENCOUNTER — Ambulatory Visit: Payer: Medicare Other | Admitting: Orthopedic Surgery

## 2019-02-25 ENCOUNTER — Ambulatory Visit: Payer: Medicare Other | Admitting: Physician Assistant

## 2019-02-25 DIAGNOSIS — D631 Anemia in chronic kidney disease: Secondary | ICD-10-CM | POA: Diagnosis not present

## 2019-02-25 DIAGNOSIS — N2581 Secondary hyperparathyroidism of renal origin: Secondary | ICD-10-CM | POA: Diagnosis not present

## 2019-02-25 DIAGNOSIS — N186 End stage renal disease: Secondary | ICD-10-CM | POA: Diagnosis not present

## 2019-02-25 DIAGNOSIS — Z992 Dependence on renal dialysis: Secondary | ICD-10-CM | POA: Diagnosis not present

## 2019-02-27 ENCOUNTER — Ambulatory Visit: Payer: Medicare Other | Admitting: Physician Assistant

## 2019-02-27 DIAGNOSIS — N186 End stage renal disease: Secondary | ICD-10-CM | POA: Diagnosis not present

## 2019-02-27 DIAGNOSIS — Z992 Dependence on renal dialysis: Secondary | ICD-10-CM | POA: Diagnosis not present

## 2019-02-27 DIAGNOSIS — N2581 Secondary hyperparathyroidism of renal origin: Secondary | ICD-10-CM | POA: Diagnosis not present

## 2019-02-27 DIAGNOSIS — D631 Anemia in chronic kidney disease: Secondary | ICD-10-CM | POA: Diagnosis not present

## 2019-03-02 DIAGNOSIS — N2581 Secondary hyperparathyroidism of renal origin: Secondary | ICD-10-CM | POA: Diagnosis not present

## 2019-03-02 DIAGNOSIS — N186 End stage renal disease: Secondary | ICD-10-CM | POA: Diagnosis not present

## 2019-03-02 DIAGNOSIS — Z992 Dependence on renal dialysis: Secondary | ICD-10-CM | POA: Diagnosis not present

## 2019-03-02 DIAGNOSIS — D631 Anemia in chronic kidney disease: Secondary | ICD-10-CM | POA: Diagnosis not present

## 2019-03-04 ENCOUNTER — Ambulatory Visit: Payer: Medicare Other | Admitting: Physician Assistant

## 2019-03-05 DIAGNOSIS — Z992 Dependence on renal dialysis: Secondary | ICD-10-CM | POA: Diagnosis not present

## 2019-03-05 DIAGNOSIS — D631 Anemia in chronic kidney disease: Secondary | ICD-10-CM | POA: Diagnosis not present

## 2019-03-05 DIAGNOSIS — N2581 Secondary hyperparathyroidism of renal origin: Secondary | ICD-10-CM | POA: Diagnosis not present

## 2019-03-05 DIAGNOSIS — N186 End stage renal disease: Secondary | ICD-10-CM | POA: Diagnosis not present

## 2019-03-07 DIAGNOSIS — N2581 Secondary hyperparathyroidism of renal origin: Secondary | ICD-10-CM | POA: Diagnosis not present

## 2019-03-07 DIAGNOSIS — N186 End stage renal disease: Secondary | ICD-10-CM | POA: Diagnosis not present

## 2019-03-07 DIAGNOSIS — D631 Anemia in chronic kidney disease: Secondary | ICD-10-CM | POA: Diagnosis not present

## 2019-03-07 DIAGNOSIS — Z992 Dependence on renal dialysis: Secondary | ICD-10-CM | POA: Diagnosis not present

## 2019-03-08 ENCOUNTER — Other Ambulatory Visit: Payer: Self-pay | Admitting: Family Medicine

## 2019-03-09 DIAGNOSIS — Z992 Dependence on renal dialysis: Secondary | ICD-10-CM | POA: Diagnosis not present

## 2019-03-09 DIAGNOSIS — D631 Anemia in chronic kidney disease: Secondary | ICD-10-CM | POA: Diagnosis not present

## 2019-03-09 DIAGNOSIS — N2581 Secondary hyperparathyroidism of renal origin: Secondary | ICD-10-CM | POA: Diagnosis not present

## 2019-03-09 DIAGNOSIS — N186 End stage renal disease: Secondary | ICD-10-CM | POA: Diagnosis not present

## 2019-03-10 DIAGNOSIS — N186 End stage renal disease: Secondary | ICD-10-CM | POA: Diagnosis not present

## 2019-03-10 DIAGNOSIS — Z992 Dependence on renal dialysis: Secondary | ICD-10-CM | POA: Diagnosis not present

## 2019-03-12 DIAGNOSIS — Z992 Dependence on renal dialysis: Secondary | ICD-10-CM | POA: Diagnosis not present

## 2019-03-12 DIAGNOSIS — D631 Anemia in chronic kidney disease: Secondary | ICD-10-CM | POA: Diagnosis not present

## 2019-03-12 DIAGNOSIS — N186 End stage renal disease: Secondary | ICD-10-CM | POA: Diagnosis not present

## 2019-03-12 DIAGNOSIS — D509 Iron deficiency anemia, unspecified: Secondary | ICD-10-CM | POA: Diagnosis not present

## 2019-03-12 DIAGNOSIS — N2581 Secondary hyperparathyroidism of renal origin: Secondary | ICD-10-CM | POA: Diagnosis not present

## 2019-03-13 ENCOUNTER — Ambulatory Visit (INDEPENDENT_AMBULATORY_CARE_PROVIDER_SITE_OTHER): Payer: Medicare Other | Admitting: Physician Assistant

## 2019-03-13 ENCOUNTER — Encounter: Payer: Self-pay | Admitting: Physician Assistant

## 2019-03-13 ENCOUNTER — Other Ambulatory Visit: Payer: Self-pay

## 2019-03-13 VITALS — Ht 70.0 in | Wt 218.0 lb

## 2019-03-13 DIAGNOSIS — I2510 Atherosclerotic heart disease of native coronary artery without angina pectoris: Secondary | ICD-10-CM

## 2019-03-13 DIAGNOSIS — B351 Tinea unguium: Secondary | ICD-10-CM

## 2019-03-13 NOTE — Progress Notes (Signed)
Office Visit Note   Patient: Anthony Bullock           Date of Birth: [birth year]           MRN: [id number] Visit Date: 03/13/2019              Requested by: [name], MD [place],  [place] [id number] PCP: [name], MD  Chief Complaint  Patient presents with  . Left Foot - Follow-up      HPI: This is a pleasant 59 year old gentleman who was last seen about 3 months ago.  He was having difficulties with the fit of his right socket for his right below-knee amputation.  He has since have this addressed by [employer].  The new prosthetic fits him much better and he has no complaints he is also here to get his nails trimmed on his left foot  Assessment & Plan: Visit Diagnoses: No diagnosis found.  Plan: Follow-up in 3 months or sooner if needed  Follow-Up Instructions: No follow-ups on file.   Ortho Exam  Patient is alert, oriented, no adenopathy, well-dressed, normal affect, normal respiratory effort. Right lower extremity his amputation stump is completely healed no swelling no open areas has a good fit with his new prosthetic  Left foot thickened onychomycotic nails.  After obtaining verbal consent nails 1 through 5 were trimmed.  Imaging: No results found. No images are attached to the encounter.  Labs: Lab Results  Component Value Date   HGBA1C 8.9 (H) 08/06/2018   HGBA1C 11.6 02/15/2018   HGBA1C 10.7 (A) 11/07/2017   ESRSEDRATE 94 (H) 03/30/2016   ESRSEDRATE 18 11/30/2012   CRP 16.1 (H) 03/30/2016   REPTSTATUS 04/05/2016 FINAL 03/31/2016   REPTSTATUS 04/04/2016 FINAL 03/31/2016   GRAMSTAIN  03/31/2016    RARE WBC PRESENT, PREDOMINANTLY PMN NO ORGANISMS SEEN    CULT NO ANAEROBES ISOLATED 03/31/2016   CULT  03/31/2016    RARE STENOTROPHOMONAS MALTOPHILIA RARE ENTEROBACTER SPECIES    LABORGA STENOTROPHOMONAS MALTOPHILIA 03/31/2016   LABORGA ENTEROBACTER SPECIES 03/31/2016     Lab Results  Component Value Date   ALBUMIN  3.2 (L) 01/25/2017   ALBUMIN 2.5 (L) 04/27/2016   ALBUMIN 2.3 (L) 04/04/2016    Lab Results  Component Value Date   MG 2.1 12/03/2014   MG 2.1 11/04/2014   MG 2.1 03/30/2012   No results found for: VD25OH  No results found for: PREALBUMIN CBC EXTENDED Latest Ref Rng & Units 08/06/2018 11/06/2017 04/27/2016  WBC 4.0 - 10.5 K/uL 7.3 9.0 7.8  RBC 4.22 - 5.81 MIL/uL 4.32 - 3.36(L)  HGB 13.0 - 17.0 g/dL 12.6(L) 13.8 8.7(L)  HCT 39.0 - 52.0 % 40.4 41 27.7(L)  PLT 150 - 400 K/uL 153 131(A) 238  NEUTROABS 1.7 - 7.7 K/uL 4.7 7 -  LYMPHSABS 0.7 - 4.0 K/uL 1.6 - -     Body mass index is 31.28 kg/m.  Orders:  No orders of the defined types were placed in this encounter.  No orders of the defined types were placed in this encounter.    Procedures: No procedures performed  Clinical Data: No additional findings.  ROS:  All other systems negative, except as noted in the HPI. Review of Systems  Objective: Vital Signs: Ht _0  (1.778 m)   Wt 218 lb (98.9 kg)   BMI 31.28 kg/m   Specialty Comments:  No specialty comments available.  PMFS History: Patient Active Problem List   Diagnosis Date  Noted  . Onychomycosis 08/16/2016  . Status post unilateral below knee amputation, right ([place]) 04/26/2016  . Bilateral carotid bruits 03/30/2016  . Diabetes mellitus with complication ([place])   . Anemia due to chronic kidney disease   . ESRD (end stage renal disease) on dialysis ([place]) 11/30/2015  . Acute respiratory failure with hypoxia ([place]) 11/29/2015  . At high risk for falls 09/29/2014  . Peripheral vascular disease ([place]) 09/29/2014  . Osteoarthritis of both knees 01/07/2014  . Osteoarthritis of multiple joints 10/06/2013  . NICM (nonischemic cardiomyopathy) ([place]) 06/27/2013  . CAD (coronary artery disease) 03/13/2013  . Abnormal nuclear stress test 01/27/2013  . Chronic systolic congestive heart failure ([place]) 01/27/2013  . COPD (chronic obstructive pulmonary disease) ([place])  12/27/2012  . Diabetic polyneuropathy associated with type 2 diabetes mellitus ([place]) 02/10/2012  . PYELITIS/PYELONEPHRITIS DISEASES CLASSIFIED ELSW 09/28/2009  . URINARY CALCULUS 09/28/2009  . Adjustment disorder with depressed mood 08/21/2009  . Poorly controlled type II diabetes mellitus with renal complication ([place]) 58/68/2574  . Hyperlipidemia 04/14/2008  . Essential hypertension 04/14/2008  . ALLERGIC RHINITIS 04/14/2008   Past Medical History:  Diagnosis Date  . 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 Allergic rhinitis   . Anemia   . Arthritis   . Chronic systolic heart failure ([place])    a. ECHO (12/2012) EF 25-30%, HK entireanteroseptal myocardium //  b.  EF 25%, diffuse HK, grade 1 diastolic dysfunction, MAC, mild LAE, normal RVSF, trivial pericardial effusion  . COPD (chronic obstructive pulmonary disease) ([place])   . Diabetes mellitus type II   . Diabetic nephropathy ([place])   . Diabetic neuropathy ([place])   . ESRD on hemodialysis [facility])    started HD June 2017, goes to [facility] HD unit, Dr Hinda Lenis  . History of cardiac catheterization    a.Myoview 1/15:  There is significant left ventricular dysfunction. There may be slight scar at the apex. There is no significant ischemia. LV Ejection Fraction: 27%  //  b. RHC/LHC (1/15) with mean RA 6, PA 47/22 mean 33, mean PCWP 20, PVR 2.5 WU, CI 2.5; 80% dLAD stenosis, 70% diffuse large D.   . History of kidney stones   . Hyperlipidemia   . Hypertension   . Kidney stones   . NICM (nonischemic cardiomyopathy) ([place])    Primarily nonischemic. Echo (12/14) with EF 25-30%. Echo (3/15) with EF 25%, mild to moderately dilated LV, normal RV size and systolic function.   . Pneumonia   . Urethral stricture     Family History  Problem Relation Age of Onset  . Bladder Cancer Mother   . Alcohol abuse Father   . Melanoma Father   . Stroke Maternal Grandmother   . Heart Problems Maternal Grandmother         unknown  . Diabetes Maternal Grandmother   . Heart disease Maternal Grandfather   . Prostate cancer Maternal Grandfather     Past Surgical History:  Procedure Laterality Date  . ABDOMINAL AORTOGRAM W/LOWER EXTREMITY N/A 03/30/2016   Procedure: Abdominal Aortogram w/Lower Extremity;  Surgeon: Angelia Mould, MD;  Location: [place] CV LAB;  Service: Cardiovascular;  Laterality: N/A;  . AMPUTATION Right 04/26/2016   Procedure: Right Below Knee Amputation;  Surgeon: Newt Minion, MD;  Location: [place];  Service: Orthopedics;  Laterality: Right;  . AV FISTULA PLACEMENT Right 09/08/2015   Procedure: INSERTION OF 4-59m x 45cm  ARTERIOVENOUS (AV) GORE-TEX GRAFT RIGHT UPPER  ARM;  Surgeon: CHarrell Gave  Nicole Cella, MD;  Location: [place];  Service: Vascular;  Laterality: Right;  . AV FISTULA PLACEMENT Left 01/14/2016   Procedure: CREATION OF LEFT UPPER ARM ARTERIOVENOUS FISTULA;  Surgeon: Angelia Mould, MD;  Location: [place];  Service: Vascular;  Laterality: Left;  . BASCILIC VEIN TRANSPOSITION Right 08/22/2014   Procedure: RIGHT UPPER ARM BASCILIC VEIN TRANSPOSITION;  Surgeon: Angelia Mould, MD;  Location: [place];  Service: Vascular;  Laterality: Right;  . BELOW KNEE LEG AMPUTATION Right 04/26/2016  . CARDIAC CATHETERIZATION    . CARDIAC DEFIBRILLATOR PLACEMENT  06/27/2013   Sub Q       BY DR Caryl Comes  . CATARACT EXTRACTION W/PHACO Right 08/06/2018   Procedure: CATARACT EXTRACTION PHACO AND INTRAOCULAR LENS PLACEMENT (IOC);  Surgeon: Baruch Goldmann, MD;  [facility];  Service: Ophthalmology;  Laterality: Right;  CDE: 4.06  . CATARACT EXTRACTION W/PHACO Left 08/20/2018   Procedure: CATARACT EXTRACTION PHACO AND INTRAOCULAR LENS PLACEMENT (IOC);  Surgeon: Baruch Goldmann, MD;  [facility];  Service: Ophthalmology;  Laterality: Left;  CDE: 6.76  . COLONOSCOPY WITH PROPOFOL N/A 07/22/2015   Procedure: COLONOSCOPY WITH PROPOFOL;  Surgeon: Doran Stabler, MD;  [facility];  Service: Gastroenterology;  Laterality: N/A;  . FEMORAL-POPLITEAL BYPASS GRAFT Right 03/31/2016   Procedure: BYPASS GRAFT FEMORAL-POPLITEAL ARTERY USING RIGHT GREATER SAPHENOUS NONREVERSED VEIN;  Surgeon: Angelia Mould, MD;  Location: [place];  Service: Vascular;  Laterality: Right;  . HERNIA REPAIR    . I & D EXTREMITY Right 03/31/2016   Procedure: IRRIGATION AND DEBRIDEMENT FOOT;  Surgeon: Angelia Mould, MD;  Location: [place];  Service: Vascular;  Laterality: Right;  . IMPLANTABLE CARDIOVERTER DEFIBRILLATOR IMPLANT N/A 06/27/2013   Procedure: SUB Q ICD;  Surgeon: Deboraha Sprang, MD;  Location: [facility] CATH LAB;  Service: Cardiovascular;  Laterality: N/A;  . INTRAOPERATIVE ARTERIOGRAM Right 03/31/2016   Procedure: INTRA OPERATIVE ARTERIOGRAM;  Surgeon: Angelia Mould, MD;  Location: [place];  Service: Vascular;  Laterality: Right;  . IR GENERIC HISTORICAL Right 11/30/2015   IR THROMBECTOMY AV FISTULA W/THROMBOLYSIS/PTA INC/SHUNT/IMG RIGHT 11/30/2015 Aletta Edouard, MD [facility]  . IR GENERIC HISTORICAL  11/30/2015   IR US GUIDE VASC ACCESS RIGHT 11/30/2015 Aletta Edouard, MD [facility]  . IR GENERIC HISTORICAL Right 12/15/2015   IR THROMBECTOMY AV FISTULA W/THROMBOLYSIS/PTA/STENT INC/SHUNT/IMG RT 12/15/2015 Arne Cleveland, MD [facility]  . IR GENERIC HISTORICAL  12/15/2015   IR US GUIDE VASC ACCESS RIGHT 12/15/2015 Arne Cleveland, MD [facility]  . IR GENERIC HISTORICAL  12/28/2015   IR FLUORO GUIDE CV LINE RIGHT 12/28/2015 Marybelle Killings, MD [facility]  . IR GENERIC HISTORICAL  12/28/2015   IR US GUIDE VASC ACCESS RIGHT 12/28/2015 Marybelle Killings, MD [facility]  . LEFT A ND RIGHT HEART CATH  01/30/2013   DR Sung Amabile  . LEFT AND RIGHT HEART CATHETERIZATION WITH CORONARY ANGIOGRAM N/A 01/30/2013   Procedure: LEFT AND RIGHT HEART CATHETERIZATION WITH CORONARY ANGIOGRAM;  Surgeon: Jolaine Artist, MD;  Location: [facility] CATH LAB;  Service: Cardiovascular;  Laterality:  N/A;  . PERIPHERAL VASCULAR CATHETERIZATION Right 01/26/2015   Procedure: A/V Fistulagram;  Surgeon: Angelia Mould, MD;  Location: [place] CV LAB;  Service: Cardiovascular;  Laterality: Right;  . reapea urethral surgery for recurrent obstruction  2011  . TOTAL KNEE ARTHROPLASTY Right 2007  . VEIN HARVEST Right 03/31/2016   Procedure: RIGHT GREATER SAPHENOUS VEIN HARVEST;  Surgeon: Angelia Mould, MD;  Location: [facility]  OR;  Service: Vascular;  Laterality: Right;   Social History   Occupational History  . Not on file  Tobacco Use  . Smoking status: Former Smoker    Packs/day: 2.00    Years: 32.00    Pack years: 64.00    Types: Cigarettes    Quit date: 05/11/2010    Years since quitting: 8.8  . Smokeless tobacco: Never Used  Substance and Sexual Activity  . Alcohol use: No  . Drug use: No  . Sexual activity: Yes

## 2019-03-14 ENCOUNTER — Other Ambulatory Visit: Payer: Self-pay | Admitting: Family Medicine

## 2019-03-14 DIAGNOSIS — D631 Anemia in chronic kidney disease: Secondary | ICD-10-CM | POA: Diagnosis not present

## 2019-03-14 DIAGNOSIS — Z992 Dependence on renal dialysis: Secondary | ICD-10-CM | POA: Diagnosis not present

## 2019-03-14 DIAGNOSIS — N186 End stage renal disease: Secondary | ICD-10-CM | POA: Diagnosis not present

## 2019-03-14 DIAGNOSIS — N2581 Secondary hyperparathyroidism of renal origin: Secondary | ICD-10-CM | POA: Diagnosis not present

## 2019-03-14 DIAGNOSIS — D509 Iron deficiency anemia, unspecified: Secondary | ICD-10-CM | POA: Diagnosis not present

## 2019-03-16 DIAGNOSIS — D631 Anemia in chronic kidney disease: Secondary | ICD-10-CM | POA: Diagnosis not present

## 2019-03-16 DIAGNOSIS — N2581 Secondary hyperparathyroidism of renal origin: Secondary | ICD-10-CM | POA: Diagnosis not present

## 2019-03-16 DIAGNOSIS — N186 End stage renal disease: Secondary | ICD-10-CM | POA: Diagnosis not present

## 2019-03-16 DIAGNOSIS — Z992 Dependence on renal dialysis: Secondary | ICD-10-CM | POA: Diagnosis not present

## 2019-03-16 DIAGNOSIS — D509 Iron deficiency anemia, unspecified: Secondary | ICD-10-CM | POA: Diagnosis not present

## 2019-03-19 DIAGNOSIS — D509 Iron deficiency anemia, unspecified: Secondary | ICD-10-CM | POA: Diagnosis not present

## 2019-03-19 DIAGNOSIS — N2581 Secondary hyperparathyroidism of renal origin: Secondary | ICD-10-CM | POA: Diagnosis not present

## 2019-03-19 DIAGNOSIS — Z992 Dependence on renal dialysis: Secondary | ICD-10-CM | POA: Diagnosis not present

## 2019-03-19 DIAGNOSIS — D631 Anemia in chronic kidney disease: Secondary | ICD-10-CM | POA: Diagnosis not present

## 2019-03-19 DIAGNOSIS — N186 End stage renal disease: Secondary | ICD-10-CM | POA: Diagnosis not present

## 2019-03-20 ENCOUNTER — Other Ambulatory Visit: Payer: Self-pay | Admitting: *Deleted

## 2019-03-20 DIAGNOSIS — Z23 Encounter for immunization: Secondary | ICD-10-CM | POA: Diagnosis not present

## 2019-03-20 NOTE — Telephone Encounter (Signed)
[facility] [place]  Refill request:  multivitamin (RENA-VIT) TABS tablet  This is a historical medication.  Okay to fill?

## 2019-03-21 DIAGNOSIS — N186 End stage renal disease: Secondary | ICD-10-CM | POA: Diagnosis not present

## 2019-03-21 DIAGNOSIS — D631 Anemia in chronic kidney disease: Secondary | ICD-10-CM | POA: Diagnosis not present

## 2019-03-21 DIAGNOSIS — N2581 Secondary hyperparathyroidism of renal origin: Secondary | ICD-10-CM | POA: Diagnosis not present

## 2019-03-21 DIAGNOSIS — D509 Iron deficiency anemia, unspecified: Secondary | ICD-10-CM | POA: Diagnosis not present

## 2019-03-21 DIAGNOSIS — Z992 Dependence on renal dialysis: Secondary | ICD-10-CM | POA: Diagnosis not present

## 2019-03-21 MED ORDER — RENA-VITE PO TABS: 1.0000 | ORAL_TABLET | Freq: Every day | ORAL | 3 refills | Status: DC

## 2019-03-21 NOTE — Telephone Encounter (Signed)
Refills OK for one year. 

## 2019-03-23 DIAGNOSIS — N186 End stage renal disease: Secondary | ICD-10-CM | POA: Diagnosis not present

## 2019-03-23 DIAGNOSIS — D631 Anemia in chronic kidney disease: Secondary | ICD-10-CM | POA: Diagnosis not present

## 2019-03-23 DIAGNOSIS — D509 Iron deficiency anemia, unspecified: Secondary | ICD-10-CM | POA: Diagnosis not present

## 2019-03-23 DIAGNOSIS — Z992 Dependence on renal dialysis: Secondary | ICD-10-CM | POA: Diagnosis not present

## 2019-03-23 DIAGNOSIS — N2581 Secondary hyperparathyroidism of renal origin: Secondary | ICD-10-CM | POA: Diagnosis not present

## 2019-03-26 DIAGNOSIS — N2581 Secondary hyperparathyroidism of renal origin: Secondary | ICD-10-CM | POA: Diagnosis not present

## 2019-03-26 DIAGNOSIS — D509 Iron deficiency anemia, unspecified: Secondary | ICD-10-CM | POA: Diagnosis not present

## 2019-03-26 DIAGNOSIS — D631 Anemia in chronic kidney disease: Secondary | ICD-10-CM | POA: Diagnosis not present

## 2019-03-26 DIAGNOSIS — N186 End stage renal disease: Secondary | ICD-10-CM | POA: Diagnosis not present

## 2019-03-26 DIAGNOSIS — Z992 Dependence on renal dialysis: Secondary | ICD-10-CM | POA: Diagnosis not present

## 2019-03-28 DIAGNOSIS — D631 Anemia in chronic kidney disease: Secondary | ICD-10-CM | POA: Diagnosis not present

## 2019-03-28 DIAGNOSIS — Z992 Dependence on renal dialysis: Secondary | ICD-10-CM | POA: Diagnosis not present

## 2019-03-28 DIAGNOSIS — N186 End stage renal disease: Secondary | ICD-10-CM | POA: Diagnosis not present

## 2019-03-28 DIAGNOSIS — D509 Iron deficiency anemia, unspecified: Secondary | ICD-10-CM | POA: Diagnosis not present

## 2019-03-28 DIAGNOSIS — N2581 Secondary hyperparathyroidism of renal origin: Secondary | ICD-10-CM | POA: Diagnosis not present

## 2019-03-30 DIAGNOSIS — N2581 Secondary hyperparathyroidism of renal origin: Secondary | ICD-10-CM | POA: Diagnosis not present

## 2019-03-30 DIAGNOSIS — Z992 Dependence on renal dialysis: Secondary | ICD-10-CM | POA: Diagnosis not present

## 2019-03-30 DIAGNOSIS — D631 Anemia in chronic kidney disease: Secondary | ICD-10-CM | POA: Diagnosis not present

## 2019-03-30 DIAGNOSIS — D509 Iron deficiency anemia, unspecified: Secondary | ICD-10-CM | POA: Diagnosis not present

## 2019-03-30 DIAGNOSIS — N186 End stage renal disease: Secondary | ICD-10-CM | POA: Diagnosis not present

## 2019-04-02 ENCOUNTER — Other Ambulatory Visit: Payer: Self-pay | Admitting: Family Medicine

## 2019-04-02 DIAGNOSIS — Z992 Dependence on renal dialysis: Secondary | ICD-10-CM | POA: Diagnosis not present

## 2019-04-02 DIAGNOSIS — E119 Type 2 diabetes mellitus without complications: Secondary | ICD-10-CM | POA: Diagnosis not present

## 2019-04-02 DIAGNOSIS — Z794 Long term (current) use of insulin: Secondary | ICD-10-CM | POA: Diagnosis not present

## 2019-04-02 DIAGNOSIS — D509 Iron deficiency anemia, unspecified: Secondary | ICD-10-CM | POA: Diagnosis not present

## 2019-04-02 DIAGNOSIS — N186 End stage renal disease: Secondary | ICD-10-CM | POA: Diagnosis not present

## 2019-04-02 DIAGNOSIS — D631 Anemia in chronic kidney disease: Secondary | ICD-10-CM | POA: Diagnosis not present

## 2019-04-02 DIAGNOSIS — N2581 Secondary hyperparathyroidism of renal origin: Secondary | ICD-10-CM | POA: Diagnosis not present

## 2019-04-04 DIAGNOSIS — N186 End stage renal disease: Secondary | ICD-10-CM | POA: Diagnosis not present

## 2019-04-04 DIAGNOSIS — D509 Iron deficiency anemia, unspecified: Secondary | ICD-10-CM | POA: Diagnosis not present

## 2019-04-04 DIAGNOSIS — D631 Anemia in chronic kidney disease: Secondary | ICD-10-CM | POA: Diagnosis not present

## 2019-04-04 DIAGNOSIS — Z992 Dependence on renal dialysis: Secondary | ICD-10-CM | POA: Diagnosis not present

## 2019-04-04 DIAGNOSIS — N2581 Secondary hyperparathyroidism of renal origin: Secondary | ICD-10-CM | POA: Diagnosis not present

## 2019-04-06 DIAGNOSIS — N186 End stage renal disease: Secondary | ICD-10-CM | POA: Diagnosis not present

## 2019-04-06 DIAGNOSIS — D509 Iron deficiency anemia, unspecified: Secondary | ICD-10-CM | POA: Diagnosis not present

## 2019-04-06 DIAGNOSIS — Z992 Dependence on renal dialysis: Secondary | ICD-10-CM | POA: Diagnosis not present

## 2019-04-06 DIAGNOSIS — N2581 Secondary hyperparathyroidism of renal origin: Secondary | ICD-10-CM | POA: Diagnosis not present

## 2019-04-06 DIAGNOSIS — D631 Anemia in chronic kidney disease: Secondary | ICD-10-CM | POA: Diagnosis not present

## 2019-04-09 DIAGNOSIS — D631 Anemia in chronic kidney disease: Secondary | ICD-10-CM | POA: Diagnosis not present

## 2019-04-09 DIAGNOSIS — D509 Iron deficiency anemia, unspecified: Secondary | ICD-10-CM | POA: Diagnosis not present

## 2019-04-09 DIAGNOSIS — N186 End stage renal disease: Secondary | ICD-10-CM | POA: Diagnosis not present

## 2019-04-09 DIAGNOSIS — Z992 Dependence on renal dialysis: Secondary | ICD-10-CM | POA: Diagnosis not present

## 2019-04-09 DIAGNOSIS — N2581 Secondary hyperparathyroidism of renal origin: Secondary | ICD-10-CM | POA: Diagnosis not present

## 2019-04-10 DIAGNOSIS — N186 End stage renal disease: Secondary | ICD-10-CM | POA: Diagnosis not present

## 2019-04-10 DIAGNOSIS — Z992 Dependence on renal dialysis: Secondary | ICD-10-CM | POA: Diagnosis not present

## 2019-04-11 DIAGNOSIS — D509 Iron deficiency anemia, unspecified: Secondary | ICD-10-CM | POA: Diagnosis not present

## 2019-04-11 DIAGNOSIS — N2581 Secondary hyperparathyroidism of renal origin: Secondary | ICD-10-CM | POA: Diagnosis not present

## 2019-04-11 DIAGNOSIS — D631 Anemia in chronic kidney disease: Secondary | ICD-10-CM | POA: Diagnosis not present

## 2019-04-11 DIAGNOSIS — N186 End stage renal disease: Secondary | ICD-10-CM | POA: Diagnosis not present

## 2019-04-11 DIAGNOSIS — Z992 Dependence on renal dialysis: Secondary | ICD-10-CM | POA: Diagnosis not present

## 2019-04-13 DIAGNOSIS — N2581 Secondary hyperparathyroidism of renal origin: Secondary | ICD-10-CM | POA: Diagnosis not present

## 2019-04-13 DIAGNOSIS — Z992 Dependence on renal dialysis: Secondary | ICD-10-CM | POA: Diagnosis not present

## 2019-04-13 DIAGNOSIS — D509 Iron deficiency anemia, unspecified: Secondary | ICD-10-CM | POA: Diagnosis not present

## 2019-04-13 DIAGNOSIS — N186 End stage renal disease: Secondary | ICD-10-CM | POA: Diagnosis not present

## 2019-04-13 DIAGNOSIS — D631 Anemia in chronic kidney disease: Secondary | ICD-10-CM | POA: Diagnosis not present

## 2019-04-16 DIAGNOSIS — N2581 Secondary hyperparathyroidism of renal origin: Secondary | ICD-10-CM | POA: Diagnosis not present

## 2019-04-16 DIAGNOSIS — N186 End stage renal disease: Secondary | ICD-10-CM | POA: Diagnosis not present

## 2019-04-16 DIAGNOSIS — D631 Anemia in chronic kidney disease: Secondary | ICD-10-CM | POA: Diagnosis not present

## 2019-04-16 DIAGNOSIS — Z992 Dependence on renal dialysis: Secondary | ICD-10-CM | POA: Diagnosis not present

## 2019-04-16 DIAGNOSIS — D509 Iron deficiency anemia, unspecified: Secondary | ICD-10-CM | POA: Diagnosis not present

## 2019-04-17 DIAGNOSIS — Z23 Encounter for immunization: Secondary | ICD-10-CM | POA: Diagnosis not present

## 2019-04-18 DIAGNOSIS — N186 End stage renal disease: Secondary | ICD-10-CM | POA: Diagnosis not present

## 2019-04-18 DIAGNOSIS — D631 Anemia in chronic kidney disease: Secondary | ICD-10-CM | POA: Diagnosis not present

## 2019-04-18 DIAGNOSIS — N2581 Secondary hyperparathyroidism of renal origin: Secondary | ICD-10-CM | POA: Diagnosis not present

## 2019-04-18 DIAGNOSIS — D509 Iron deficiency anemia, unspecified: Secondary | ICD-10-CM | POA: Diagnosis not present

## 2019-04-18 DIAGNOSIS — Z992 Dependence on renal dialysis: Secondary | ICD-10-CM | POA: Diagnosis not present

## 2019-04-20 DIAGNOSIS — Z992 Dependence on renal dialysis: Secondary | ICD-10-CM | POA: Diagnosis not present

## 2019-04-20 DIAGNOSIS — N2581 Secondary hyperparathyroidism of renal origin: Secondary | ICD-10-CM | POA: Diagnosis not present

## 2019-04-20 DIAGNOSIS — D631 Anemia in chronic kidney disease: Secondary | ICD-10-CM | POA: Diagnosis not present

## 2019-04-20 DIAGNOSIS — D509 Iron deficiency anemia, unspecified: Secondary | ICD-10-CM | POA: Diagnosis not present

## 2019-04-20 DIAGNOSIS — N186 End stage renal disease: Secondary | ICD-10-CM | POA: Diagnosis not present

## 2019-04-23 DIAGNOSIS — Z992 Dependence on renal dialysis: Secondary | ICD-10-CM | POA: Diagnosis not present

## 2019-04-23 DIAGNOSIS — D509 Iron deficiency anemia, unspecified: Secondary | ICD-10-CM | POA: Diagnosis not present

## 2019-04-23 DIAGNOSIS — N2581 Secondary hyperparathyroidism of renal origin: Secondary | ICD-10-CM | POA: Diagnosis not present

## 2019-04-23 DIAGNOSIS — N186 End stage renal disease: Secondary | ICD-10-CM | POA: Diagnosis not present

## 2019-04-23 DIAGNOSIS — D631 Anemia in chronic kidney disease: Secondary | ICD-10-CM | POA: Diagnosis not present

## 2019-04-25 DIAGNOSIS — D509 Iron deficiency anemia, unspecified: Secondary | ICD-10-CM | POA: Diagnosis not present

## 2019-04-25 DIAGNOSIS — N186 End stage renal disease: Secondary | ICD-10-CM | POA: Diagnosis not present

## 2019-04-25 DIAGNOSIS — D631 Anemia in chronic kidney disease: Secondary | ICD-10-CM | POA: Diagnosis not present

## 2019-04-25 DIAGNOSIS — Z992 Dependence on renal dialysis: Secondary | ICD-10-CM | POA: Diagnosis not present

## 2019-04-25 DIAGNOSIS — N2581 Secondary hyperparathyroidism of renal origin: Secondary | ICD-10-CM | POA: Diagnosis not present

## 2019-04-27 DIAGNOSIS — D631 Anemia in chronic kidney disease: Secondary | ICD-10-CM | POA: Diagnosis not present

## 2019-04-27 DIAGNOSIS — Z992 Dependence on renal dialysis: Secondary | ICD-10-CM | POA: Diagnosis not present

## 2019-04-27 DIAGNOSIS — D509 Iron deficiency anemia, unspecified: Secondary | ICD-10-CM | POA: Diagnosis not present

## 2019-04-27 DIAGNOSIS — N186 End stage renal disease: Secondary | ICD-10-CM | POA: Diagnosis not present

## 2019-04-27 DIAGNOSIS — N2581 Secondary hyperparathyroidism of renal origin: Secondary | ICD-10-CM | POA: Diagnosis not present

## 2019-04-30 DIAGNOSIS — N2581 Secondary hyperparathyroidism of renal origin: Secondary | ICD-10-CM | POA: Diagnosis not present

## 2019-04-30 DIAGNOSIS — D631 Anemia in chronic kidney disease: Secondary | ICD-10-CM | POA: Diagnosis not present

## 2019-04-30 DIAGNOSIS — N186 End stage renal disease: Secondary | ICD-10-CM | POA: Diagnosis not present

## 2019-04-30 DIAGNOSIS — D509 Iron deficiency anemia, unspecified: Secondary | ICD-10-CM | POA: Diagnosis not present

## 2019-04-30 DIAGNOSIS — Z992 Dependence on renal dialysis: Secondary | ICD-10-CM | POA: Diagnosis not present

## 2019-05-02 DIAGNOSIS — D631 Anemia in chronic kidney disease: Secondary | ICD-10-CM | POA: Diagnosis not present

## 2019-05-02 DIAGNOSIS — Z992 Dependence on renal dialysis: Secondary | ICD-10-CM | POA: Diagnosis not present

## 2019-05-02 DIAGNOSIS — N2581 Secondary hyperparathyroidism of renal origin: Secondary | ICD-10-CM | POA: Diagnosis not present

## 2019-05-02 DIAGNOSIS — D509 Iron deficiency anemia, unspecified: Secondary | ICD-10-CM | POA: Diagnosis not present

## 2019-05-02 DIAGNOSIS — N186 End stage renal disease: Secondary | ICD-10-CM | POA: Diagnosis not present

## 2019-05-04 DIAGNOSIS — D631 Anemia in chronic kidney disease: Secondary | ICD-10-CM | POA: Diagnosis not present

## 2019-05-04 DIAGNOSIS — Z992 Dependence on renal dialysis: Secondary | ICD-10-CM | POA: Diagnosis not present

## 2019-05-04 DIAGNOSIS — N2581 Secondary hyperparathyroidism of renal origin: Secondary | ICD-10-CM | POA: Diagnosis not present

## 2019-05-04 DIAGNOSIS — N186 End stage renal disease: Secondary | ICD-10-CM | POA: Diagnosis not present

## 2019-05-04 DIAGNOSIS — D509 Iron deficiency anemia, unspecified: Secondary | ICD-10-CM | POA: Diagnosis not present

## 2019-05-07 DIAGNOSIS — N2581 Secondary hyperparathyroidism of renal origin: Secondary | ICD-10-CM | POA: Diagnosis not present

## 2019-05-07 DIAGNOSIS — Z992 Dependence on renal dialysis: Secondary | ICD-10-CM | POA: Diagnosis not present

## 2019-05-07 DIAGNOSIS — D631 Anemia in chronic kidney disease: Secondary | ICD-10-CM | POA: Diagnosis not present

## 2019-05-07 DIAGNOSIS — N186 End stage renal disease: Secondary | ICD-10-CM | POA: Diagnosis not present

## 2019-05-07 DIAGNOSIS — D509 Iron deficiency anemia, unspecified: Secondary | ICD-10-CM | POA: Diagnosis not present

## 2019-05-08 ENCOUNTER — Ambulatory Visit (INDEPENDENT_AMBULATORY_CARE_PROVIDER_SITE_OTHER): Payer: Medicare Other | Admitting: Family Medicine

## 2019-05-08 ENCOUNTER — Other Ambulatory Visit: Payer: Self-pay

## 2019-05-08 ENCOUNTER — Telehealth: Payer: Self-pay | Admitting: Family Medicine

## 2019-05-08 ENCOUNTER — Encounter: Payer: Self-pay | Admitting: Family Medicine

## 2019-05-08 VITALS — BP 138/72 | HR 96 | Temp 98.0°F | Ht 70.0 in | Wt 220.8 lb

## 2019-05-08 DIAGNOSIS — E1129 Type 2 diabetes mellitus with other diabetic kidney complication: Secondary | ICD-10-CM | POA: Diagnosis not present

## 2019-05-08 DIAGNOSIS — Z992 Dependence on renal dialysis: Secondary | ICD-10-CM | POA: Diagnosis not present

## 2019-05-08 DIAGNOSIS — I2510 Atherosclerotic heart disease of native coronary artery without angina pectoris: Secondary | ICD-10-CM

## 2019-05-08 DIAGNOSIS — I10 Essential (primary) hypertension: Secondary | ICD-10-CM | POA: Diagnosis not present

## 2019-05-08 DIAGNOSIS — Z89511 Acquired absence of right leg below knee: Secondary | ICD-10-CM

## 2019-05-08 DIAGNOSIS — E1165 Type 2 diabetes mellitus with hyperglycemia: Secondary | ICD-10-CM | POA: Diagnosis not present

## 2019-05-08 DIAGNOSIS — N186 End stage renal disease: Secondary | ICD-10-CM | POA: Diagnosis not present

## 2019-05-08 DIAGNOSIS — E785 Hyperlipidemia, unspecified: Secondary | ICD-10-CM | POA: Diagnosis not present

## 2019-05-08 LAB — HEPATIC FUNCTION PANEL
ALT: 22 U/L (ref 0–53)
AST: 25 U/L (ref 0–37)
Albumin: 3.9 g/dL (ref 3.5–5.2)
Alkaline Phosphatase: 91 U/L (ref 39–117)
Bilirubin, Direct: 0.1 mg/dL (ref 0.0–0.3)
Total Bilirubin: 0.4 mg/dL (ref 0.2–1.2)
Total Protein: 7 g/dL (ref 6.0–8.3)

## 2019-05-08 LAB — LIPID PANEL
Cholesterol: 158 mg/dL (ref 0–200)
HDL: 37 mg/dL — ABNORMAL LOW (ref >39.00)
LDL Cholesterol: 84 mg/dL (ref 0–99)
NonHDL: 120.99
Total CHOL/HDL Ratio: 4
Triglycerides: 187 mg/dL — ABNORMAL HIGH (ref 0.0–149.0)
VLDL: 37.4 mg/dL (ref 0.0–40.0)

## 2019-05-08 MED ORDER — CARVEDILOL 12.5 MG PO TABS: 12.5000 mg | ORAL_TABLET | Freq: Two times a day (BID) | ORAL | 3 refills | Status: DC

## 2019-05-08 MED ORDER — HUMALOG KWIKPEN 200 UNIT/ML ~~LOC~~ SOPN: PEN_INJECTOR | SUBCUTANEOUS | 11 refills | Status: DC

## 2019-05-08 MED ORDER — BASAGLAR KWIKPEN 100 UNIT/ML ~~LOC~~ SOPN: PEN_INJECTOR | SUBCUTANEOUS | 11 refills | Status: DC

## 2019-05-08 MED ORDER — FENOFIBRATE 160 MG PO TABS: 160.0000 mg | ORAL_TABLET | Freq: Every day | ORAL | 3 refills | Status: DC

## 2019-05-08 MED ORDER — GABAPENTIN 300 MG PO CAPS: 600.0000 mg | ORAL_CAPSULE | Freq: Two times a day (BID) | ORAL | 3 refills | Status: DC

## 2019-05-08 MED ORDER — ATORVASTATIN CALCIUM 80 MG PO TABS: 80.0000 mg | ORAL_TABLET | Freq: Every day | ORAL | 3 refills | Status: DC

## 2019-05-08 NOTE — Telephone Encounter (Signed)
Maxium daily dose has been given to pharmacy

## 2019-05-08 NOTE — Telephone Encounter (Signed)
Please advise on max daily dose

## 2019-05-08 NOTE — Telephone Encounter (Signed)
Maximum daily dose of Basaglar insulin is 74 units and for Humalog maximum daily combined dose by sliding scale is 28 units

## 2019-05-08 NOTE — Progress Notes (Signed)
Subjective:     Patient ID: Anthony Bullock, male   DOB: 06/29/1960, 59 y.o.   MRN: [id number]  HPI   Sandeep seen for medical follow-up.  He has complicated past medical history.  He has history of CAD, hypertension, nonischemic cardiomyopathy, end-stage renal disease on dialysis, type 2 diabetes with long history of poor control.  Poor compliance with diet.[name]  History of peripheral vascular disease with previous right below-knee amputation.  He had follow-up recent with orthopedist and had left foot and right stump checked and there were no concerns.  Regarding diabetes he is on Basaglar 74 units once daily.  He supposed to be on Humalog but is currently not taking any.  He is not checking blood sugars regularly.  Does not bring a log in for review.  Still has very poor diet.  Usually eats cheeseburger for lunch.  Frequently eats eggs, bacon, and sausage for breakfast.  Due for follow-up eye exam.  No history of shingles vaccine.  He did get Covid vaccine recently.  He would like to consider shingles vaccine this year.  He has hyperlipidemia treated with fenofibrate and Lipitor.  He is overdue for lipids.  He is getting regular chemistries through nephrology.  He had recent A1c of 9.9% which was on April 15.  Recent labs reviewed.  He is currently getting dialysis every Tuesday, Thursday, and Saturday.  He has fatigue after dialysis but overall feels stable.  No dyspnea.  No chest pains.  Peripheral neuropathy treated with gabapentin 600 mg twice daily.  Needs refills of all medications today.  Past Medical History:  Diagnosis Date  . 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 Allergic rhinitis   . Anemia   . Arthritis   . Chronic systolic heart failure ([place])    a. ECHO (12/2012) EF 25-30%, HK entireanteroseptal myocardium //  b.  EF 25%, diffuse HK, grade 1 diastolic dysfunction, MAC, mild LAE, normal RVSF, trivial pericardial effusion  . COPD (chronic  obstructive pulmonary disease) ([place])   . Diabetes mellitus type II   . Diabetic nephropathy ([place])   . Diabetic neuropathy ([place])   . ESRD on hemodialysis [facility])    started HD June 2017, goes to [facility] HD unit, Dr Hinda Lenis  . History of cardiac catheterization    a.Myoview 1/15:  There is significant left ventricular dysfunction. There may be slight scar at the apex. There is no significant ischemia. LV Ejection Fraction: 27%  //  b. RHC/LHC (1/15) with mean RA 6, PA 47/22 mean 33, mean PCWP 20, PVR 2.5 WU, CI 2.5; 80% dLAD stenosis, 70% diffuse large D.   . History of kidney stones   . Hyperlipidemia   . Hypertension   . Kidney stones   . NICM (nonischemic cardiomyopathy) ([place])    Primarily nonischemic. Echo (12/14) with EF 25-30%. Echo (3/15) with EF 25%, mild to moderately dilated LV, normal RV size and systolic function.   . Pneumonia   . Urethral stricture    Past Surgical History:  Procedure Laterality Date  . ABDOMINAL AORTOGRAM W/LOWER EXTREMITY N/A 03/30/2016   Procedure: Abdominal Aortogram w/Lower Extremity;  Surgeon: Angelia Mould, MD;  Location: [place] CV LAB;  Service: Cardiovascular;  Laterality: N/A;  . AMPUTATION Right 04/26/2016   Procedure: Right Below Knee Amputation;  Surgeon: Newt Minion, MD;  Location: [place];  Service: Orthopedics;  Laterality: Right;  . AV FISTULA PLACEMENT Right 09/08/2015   Procedure: INSERTION OF 4-69m  x 45cm  ARTERIOVENOUS (AV) GORE-TEX GRAFT RIGHT UPPER  ARM;  Surgeon: Angelia Mould, MD;  Location: [place];  Service: Vascular;  Laterality: Right;  . AV FISTULA PLACEMENT Left 01/14/2016   Procedure: CREATION OF LEFT UPPER ARM ARTERIOVENOUS FISTULA;  Surgeon: Angelia Mould, MD;  Location: [place];  Service: Vascular;  Laterality: Left;  . BASCILIC VEIN TRANSPOSITION Right 08/22/2014   Procedure: RIGHT UPPER ARM BASCILIC VEIN TRANSPOSITION;  Surgeon: Angelia Mould, MD;  Location: [place];  Service: Vascular;   Laterality: Right;  . BELOW KNEE LEG AMPUTATION Right 04/26/2016  . CARDIAC CATHETERIZATION    . CARDIAC DEFIBRILLATOR PLACEMENT  06/27/2013   Sub Q       BY DR Caryl Comes  . CATARACT EXTRACTION W/PHACO Right 08/06/2018   Procedure: CATARACT EXTRACTION PHACO AND INTRAOCULAR LENS PLACEMENT (IOC);  Surgeon: Baruch Goldmann, MD;  [facility];  Service: Ophthalmology;  Laterality: Right;  CDE: 4.06  . CATARACT EXTRACTION W/PHACO Left 08/20/2018   Procedure: CATARACT EXTRACTION PHACO AND INTRAOCULAR LENS PLACEMENT (IOC);  Surgeon: Baruch Goldmann, MD;  [facility]: [facility];  Service: Ophthalmology;  Laterality: Left;  CDE: 6.76  . COLONOSCOPY WITH PROPOFOL N/A 07/22/2015   Procedure: COLONOSCOPY WITH PROPOFOL;  Surgeon: Doran Stabler, MD;  [facility];  Service: Gastroenterology;  Laterality: N/A;  . FEMORAL-POPLITEAL BYPASS GRAFT Right 03/31/2016   Procedure: BYPASS GRAFT FEMORAL-POPLITEAL ARTERY USING RIGHT GREATER SAPHENOUS NONREVERSED VEIN;  Surgeon: Angelia Mould, MD;  Location: [place];  Service: Vascular;  Laterality: Right;  . HERNIA REPAIR    . I & D EXTREMITY Right 03/31/2016   Procedure: IRRIGATION AND DEBRIDEMENT FOOT;  Surgeon: Angelia Mould, MD;  Location: [place];  Service: Vascular;  Laterality: Right;  . IMPLANTABLE CARDIOVERTER DEFIBRILLATOR IMPLANT N/A 06/27/2013   Procedure: SUB Q ICD;  Surgeon: Deboraha Sprang, MD;  Location: [facility] CATH LAB;  Service: Cardiovascular;  Laterality: N/A;  . INTRAOPERATIVE ARTERIOGRAM Right 03/31/2016   Procedure: INTRA OPERATIVE ARTERIOGRAM;  Surgeon: Angelia Mould, MD;  Location: [place];  Service: Vascular;  Laterality: Right;  . IR GENERIC HISTORICAL Right 11/30/2015   IR THROMBECTOMY AV FISTULA W/THROMBOLYSIS/PTA INC/SHUNT/IMG RIGHT 11/30/2015 Aletta Edouard, MD [facility]  . IR GENERIC HISTORICAL  11/30/2015   IR US GUIDE VASC ACCESS RIGHT 11/30/2015 Aletta Edouard, MD [facility]  . IR GENERIC HISTORICAL Right 12/15/2015    IR THROMBECTOMY AV FISTULA W/THROMBOLYSIS/PTA/STENT INC/SHUNT/IMG RT 12/15/2015 Arne Cleveland, MD [facility]  . IR GENERIC HISTORICAL  12/15/2015   IR US GUIDE VASC ACCESS RIGHT 12/15/2015 Arne Cleveland, MD [facility]  . IR GENERIC HISTORICAL  12/28/2015   IR FLUORO GUIDE CV LINE RIGHT 12/28/2015 Marybelle Killings, MD [facility]  . IR GENERIC HISTORICAL  12/28/2015   IR US GUIDE VASC ACCESS RIGHT 12/28/2015 Marybelle Killings, MD [facility]  . LEFT A ND RIGHT HEART CATH  01/30/2013   DR Sung Amabile  . LEFT AND RIGHT HEART CATHETERIZATION WITH CORONARY ANGIOGRAM N/A 01/30/2013   Procedure: LEFT AND RIGHT HEART CATHETERIZATION WITH CORONARY ANGIOGRAM;  Surgeon: Jolaine Artist, MD;  Location: [facility] CATH LAB;  Service: Cardiovascular;  Laterality: N/A;  . PERIPHERAL VASCULAR CATHETERIZATION Right 01/26/2015   Procedure: A/V Fistulagram;  Surgeon: Angelia Mould, MD;  Location: [place] CV LAB;  Service: Cardiovascular;  Laterality: Right;  . reapea urethral surgery for recurrent obstruction  2011  . TOTAL KNEE ARTHROPLASTY Right 2007  . VEIN HARVEST Right 03/31/2016   Procedure:  RIGHT GREATER SAPHENOUS VEIN HARVEST;  Surgeon: Angelia Mould, MD;  Location: [place];  Service: Vascular;  Laterality: Right;    reports that he quit smoking about 8 years ago. His smoking use included cigarettes. He has a 64.00 pack-year smoking history. He has never used smokeless tobacco. He reports that he does not drink alcohol or use drugs. family history includes Alcohol abuse in his father; Bladder Cancer in his mother; Diabetes in his maternal grandmother; Heart Problems in his maternal grandmother; Heart disease in his maternal grandfather; Melanoma in his father; Prostate cancer in his maternal grandfather; Stroke in his maternal grandmother. Allergies  Allergen Reactions  . Morphine Sulfate Rash and Other (See Comments)    Itches all over, red spots     Review of Systems  Constitutional:  Negative for appetite change, chills, fatigue, fever and unexpected weight change.  Eyes: Negative for visual disturbance.  Respiratory: Negative for cough, chest tightness and shortness of breath.   Cardiovascular: Negative for chest pain and palpitations.  Gastrointestinal: Negative for abdominal pain.  Neurological: Negative for dizziness, syncope, weakness, light-headedness and headaches.  Psychiatric/Behavioral: Negative for confusion.       Objective:   Physical Exam Vitals reviewed.  Constitutional:      Appearance: Normal appearance. He is not ill-appearing or toxic-appearing.  Cardiovascular:     Rate and Rhythm: Normal rate and regular rhythm.  Pulmonary:     Effort: Pulmonary effort is normal.     Breath sounds: Normal breath sounds. No wheezing or rales.  Musculoskeletal:     Comments: Trace pitting edema left leg  Neurological:     General: No focal deficit present.     Mental Status: He is alert.     Cranial Nerves: No cranial nerve deficit.        Assessment:     #1 type 2 diabetes with history of poor control with recent A1c 9.9%.  Poor compliance with medications and diet.  Currently not using Humalog with meals.  #2 hypertension stable  #3 end-stage renal disease on hemodialysis  #4 dyslipidemia with goal LDL less than 70  #5 diabetic peripheral neuropathy pains controlled fairly well with gabapentin high dosage    Plan:     -Check lipid and hepatic panel  -Add back Humalog and will use sliding scale which was printed out and patient will use this every day with lunch and supper.  He has been reluctant in the past use 3 times daily.  His current breakfasts are usually very low-carb and so we will try to focus on better control with lunch and supper and continue current dosage of Basaglar  -Refilled several medications including Lipitor, gabapentin, fenofibrate, Humalog, and Basaglar  -Needs follow-up eye exam and he is encouraged to set this up.  He  will check at pharmacy regarding shingles vaccine.  -He is encouraged if possible to bring back some readings from home next visit to help regulate his insulin  -Schedule follow-up in 3 months to reassess  [name] MD [facility]

## 2019-05-08 NOTE — Telephone Encounter (Signed)
Sabrina from [place] needed to confirm the max daily dose of insulin medication. Please follow up with pharmacy with this information.

## 2019-05-08 NOTE — Patient Instructions (Signed)
Diabetic Sliding Scale (I)  IF BLOOD SUGAR IS:   LESS THAN 100 ( NO INSULIN)   100-140 (2 UNITS)   141-180 (4 UNITS)   181-220 (6 UNITS)   221-260 (8 UNITS)   261-300 (10 UNITS)   301-340 (12 UNITS)   MORE THAN 341 UNITS (14 UNITS)  Use the sliding scale twice daily with lunch and supper  Continue with same dose of Basaglar  Check at pharmacy regarding Shingrix vaccine  Set up repeat eye exam

## 2019-05-09 DIAGNOSIS — N2581 Secondary hyperparathyroidism of renal origin: Secondary | ICD-10-CM | POA: Diagnosis not present

## 2019-05-09 DIAGNOSIS — Z992 Dependence on renal dialysis: Secondary | ICD-10-CM | POA: Diagnosis not present

## 2019-05-09 DIAGNOSIS — N186 End stage renal disease: Secondary | ICD-10-CM | POA: Diagnosis not present

## 2019-05-09 DIAGNOSIS — D509 Iron deficiency anemia, unspecified: Secondary | ICD-10-CM | POA: Diagnosis not present

## 2019-05-09 DIAGNOSIS — D631 Anemia in chronic kidney disease: Secondary | ICD-10-CM | POA: Diagnosis not present

## 2019-05-10 DIAGNOSIS — Z992 Dependence on renal dialysis: Secondary | ICD-10-CM | POA: Diagnosis not present

## 2019-05-10 DIAGNOSIS — N186 End stage renal disease: Secondary | ICD-10-CM | POA: Diagnosis not present

## 2019-05-11 DIAGNOSIS — N2581 Secondary hyperparathyroidism of renal origin: Secondary | ICD-10-CM | POA: Diagnosis not present

## 2019-05-11 DIAGNOSIS — D631 Anemia in chronic kidney disease: Secondary | ICD-10-CM | POA: Diagnosis not present

## 2019-05-11 DIAGNOSIS — D509 Iron deficiency anemia, unspecified: Secondary | ICD-10-CM | POA: Diagnosis not present

## 2019-05-11 DIAGNOSIS — Z992 Dependence on renal dialysis: Secondary | ICD-10-CM | POA: Diagnosis not present

## 2019-05-11 DIAGNOSIS — N186 End stage renal disease: Secondary | ICD-10-CM | POA: Diagnosis not present

## 2019-05-14 DIAGNOSIS — N2581 Secondary hyperparathyroidism of renal origin: Secondary | ICD-10-CM | POA: Diagnosis not present

## 2019-05-14 DIAGNOSIS — N186 End stage renal disease: Secondary | ICD-10-CM | POA: Diagnosis not present

## 2019-05-14 DIAGNOSIS — D631 Anemia in chronic kidney disease: Secondary | ICD-10-CM | POA: Diagnosis not present

## 2019-05-14 DIAGNOSIS — Z992 Dependence on renal dialysis: Secondary | ICD-10-CM | POA: Diagnosis not present

## 2019-05-14 DIAGNOSIS — D509 Iron deficiency anemia, unspecified: Secondary | ICD-10-CM | POA: Diagnosis not present

## 2019-05-16 DIAGNOSIS — D631 Anemia in chronic kidney disease: Secondary | ICD-10-CM | POA: Diagnosis not present

## 2019-05-16 DIAGNOSIS — N2581 Secondary hyperparathyroidism of renal origin: Secondary | ICD-10-CM | POA: Diagnosis not present

## 2019-05-16 DIAGNOSIS — D509 Iron deficiency anemia, unspecified: Secondary | ICD-10-CM | POA: Diagnosis not present

## 2019-05-16 DIAGNOSIS — N186 End stage renal disease: Secondary | ICD-10-CM | POA: Diagnosis not present

## 2019-05-16 DIAGNOSIS — Z992 Dependence on renal dialysis: Secondary | ICD-10-CM | POA: Diagnosis not present

## 2019-05-18 DIAGNOSIS — D509 Iron deficiency anemia, unspecified: Secondary | ICD-10-CM | POA: Diagnosis not present

## 2019-05-18 DIAGNOSIS — N186 End stage renal disease: Secondary | ICD-10-CM | POA: Diagnosis not present

## 2019-05-18 DIAGNOSIS — D631 Anemia in chronic kidney disease: Secondary | ICD-10-CM | POA: Diagnosis not present

## 2019-05-18 DIAGNOSIS — Z992 Dependence on renal dialysis: Secondary | ICD-10-CM | POA: Diagnosis not present

## 2019-05-18 DIAGNOSIS — N2581 Secondary hyperparathyroidism of renal origin: Secondary | ICD-10-CM | POA: Diagnosis not present

## 2019-05-21 DIAGNOSIS — D631 Anemia in chronic kidney disease: Secondary | ICD-10-CM | POA: Diagnosis not present

## 2019-05-21 DIAGNOSIS — D509 Iron deficiency anemia, unspecified: Secondary | ICD-10-CM | POA: Diagnosis not present

## 2019-05-21 DIAGNOSIS — N2581 Secondary hyperparathyroidism of renal origin: Secondary | ICD-10-CM | POA: Diagnosis not present

## 2019-05-21 DIAGNOSIS — Z992 Dependence on renal dialysis: Secondary | ICD-10-CM | POA: Diagnosis not present

## 2019-05-21 DIAGNOSIS — N186 End stage renal disease: Secondary | ICD-10-CM | POA: Diagnosis not present

## 2019-05-23 DIAGNOSIS — D631 Anemia in chronic kidney disease: Secondary | ICD-10-CM | POA: Diagnosis not present

## 2019-05-23 DIAGNOSIS — N186 End stage renal disease: Secondary | ICD-10-CM | POA: Diagnosis not present

## 2019-05-23 DIAGNOSIS — N2581 Secondary hyperparathyroidism of renal origin: Secondary | ICD-10-CM | POA: Diagnosis not present

## 2019-05-23 DIAGNOSIS — Z992 Dependence on renal dialysis: Secondary | ICD-10-CM | POA: Diagnosis not present

## 2019-05-23 DIAGNOSIS — D509 Iron deficiency anemia, unspecified: Secondary | ICD-10-CM | POA: Diagnosis not present

## 2019-05-25 DIAGNOSIS — N186 End stage renal disease: Secondary | ICD-10-CM | POA: Diagnosis not present

## 2019-05-25 DIAGNOSIS — D631 Anemia in chronic kidney disease: Secondary | ICD-10-CM | POA: Diagnosis not present

## 2019-05-25 DIAGNOSIS — N2581 Secondary hyperparathyroidism of renal origin: Secondary | ICD-10-CM | POA: Diagnosis not present

## 2019-05-25 DIAGNOSIS — D509 Iron deficiency anemia, unspecified: Secondary | ICD-10-CM | POA: Diagnosis not present

## 2019-05-25 DIAGNOSIS — Z992 Dependence on renal dialysis: Secondary | ICD-10-CM | POA: Diagnosis not present

## 2019-05-28 DIAGNOSIS — N2581 Secondary hyperparathyroidism of renal origin: Secondary | ICD-10-CM | POA: Diagnosis not present

## 2019-05-28 DIAGNOSIS — D631 Anemia in chronic kidney disease: Secondary | ICD-10-CM | POA: Diagnosis not present

## 2019-05-28 DIAGNOSIS — N186 End stage renal disease: Secondary | ICD-10-CM | POA: Diagnosis not present

## 2019-05-28 DIAGNOSIS — Z992 Dependence on renal dialysis: Secondary | ICD-10-CM | POA: Diagnosis not present

## 2019-05-28 DIAGNOSIS — D509 Iron deficiency anemia, unspecified: Secondary | ICD-10-CM | POA: Diagnosis not present

## 2019-05-30 DIAGNOSIS — Z992 Dependence on renal dialysis: Secondary | ICD-10-CM | POA: Diagnosis not present

## 2019-05-30 DIAGNOSIS — D631 Anemia in chronic kidney disease: Secondary | ICD-10-CM | POA: Diagnosis not present

## 2019-05-30 DIAGNOSIS — D509 Iron deficiency anemia, unspecified: Secondary | ICD-10-CM | POA: Diagnosis not present

## 2019-05-30 DIAGNOSIS — N2581 Secondary hyperparathyroidism of renal origin: Secondary | ICD-10-CM | POA: Diagnosis not present

## 2019-05-30 DIAGNOSIS — N186 End stage renal disease: Secondary | ICD-10-CM | POA: Diagnosis not present

## 2019-06-01 DIAGNOSIS — D509 Iron deficiency anemia, unspecified: Secondary | ICD-10-CM | POA: Diagnosis not present

## 2019-06-01 DIAGNOSIS — N2581 Secondary hyperparathyroidism of renal origin: Secondary | ICD-10-CM | POA: Diagnosis not present

## 2019-06-01 DIAGNOSIS — D631 Anemia in chronic kidney disease: Secondary | ICD-10-CM | POA: Diagnosis not present

## 2019-06-01 DIAGNOSIS — Z992 Dependence on renal dialysis: Secondary | ICD-10-CM | POA: Diagnosis not present

## 2019-06-01 DIAGNOSIS — N186 End stage renal disease: Secondary | ICD-10-CM | POA: Diagnosis not present

## 2019-06-04 DIAGNOSIS — Z992 Dependence on renal dialysis: Secondary | ICD-10-CM | POA: Diagnosis not present

## 2019-06-04 DIAGNOSIS — N186 End stage renal disease: Secondary | ICD-10-CM | POA: Diagnosis not present

## 2019-06-04 DIAGNOSIS — D631 Anemia in chronic kidney disease: Secondary | ICD-10-CM | POA: Diagnosis not present

## 2019-06-04 DIAGNOSIS — N2581 Secondary hyperparathyroidism of renal origin: Secondary | ICD-10-CM | POA: Diagnosis not present

## 2019-06-04 DIAGNOSIS — D509 Iron deficiency anemia, unspecified: Secondary | ICD-10-CM | POA: Diagnosis not present

## 2019-06-06 DIAGNOSIS — Z992 Dependence on renal dialysis: Secondary | ICD-10-CM | POA: Diagnosis not present

## 2019-06-06 DIAGNOSIS — N186 End stage renal disease: Secondary | ICD-10-CM | POA: Diagnosis not present

## 2019-06-06 DIAGNOSIS — D509 Iron deficiency anemia, unspecified: Secondary | ICD-10-CM | POA: Diagnosis not present

## 2019-06-06 DIAGNOSIS — N2581 Secondary hyperparathyroidism of renal origin: Secondary | ICD-10-CM | POA: Diagnosis not present

## 2019-06-06 DIAGNOSIS — D631 Anemia in chronic kidney disease: Secondary | ICD-10-CM | POA: Diagnosis not present

## 2019-06-08 DIAGNOSIS — N186 End stage renal disease: Secondary | ICD-10-CM | POA: Diagnosis not present

## 2019-06-08 DIAGNOSIS — Z992 Dependence on renal dialysis: Secondary | ICD-10-CM | POA: Diagnosis not present

## 2019-06-08 DIAGNOSIS — D631 Anemia in chronic kidney disease: Secondary | ICD-10-CM | POA: Diagnosis not present

## 2019-06-08 DIAGNOSIS — D509 Iron deficiency anemia, unspecified: Secondary | ICD-10-CM | POA: Diagnosis not present

## 2019-06-08 DIAGNOSIS — N2581 Secondary hyperparathyroidism of renal origin: Secondary | ICD-10-CM | POA: Diagnosis not present

## 2019-06-10 DIAGNOSIS — N186 End stage renal disease: Secondary | ICD-10-CM | POA: Diagnosis not present

## 2019-06-10 DIAGNOSIS — Z992 Dependence on renal dialysis: Secondary | ICD-10-CM | POA: Diagnosis not present

## 2019-06-11 DIAGNOSIS — N186 End stage renal disease: Secondary | ICD-10-CM | POA: Diagnosis not present

## 2019-06-11 DIAGNOSIS — N2581 Secondary hyperparathyroidism of renal origin: Secondary | ICD-10-CM | POA: Diagnosis not present

## 2019-06-11 DIAGNOSIS — D631 Anemia in chronic kidney disease: Secondary | ICD-10-CM | POA: Diagnosis not present

## 2019-06-11 DIAGNOSIS — Z992 Dependence on renal dialysis: Secondary | ICD-10-CM | POA: Diagnosis not present

## 2019-06-13 ENCOUNTER — Ambulatory Visit: Payer: Medicare Other | Admitting: Orthopedic Surgery

## 2019-06-13 ENCOUNTER — Ambulatory Visit: Payer: Medicare Other | Admitting: Physician Assistant

## 2019-06-13 DIAGNOSIS — N2581 Secondary hyperparathyroidism of renal origin: Secondary | ICD-10-CM | POA: Diagnosis not present

## 2019-06-13 DIAGNOSIS — D631 Anemia in chronic kidney disease: Secondary | ICD-10-CM | POA: Diagnosis not present

## 2019-06-13 DIAGNOSIS — Z992 Dependence on renal dialysis: Secondary | ICD-10-CM | POA: Diagnosis not present

## 2019-06-13 DIAGNOSIS — N186 End stage renal disease: Secondary | ICD-10-CM | POA: Diagnosis not present

## 2019-06-15 DIAGNOSIS — Z992 Dependence on renal dialysis: Secondary | ICD-10-CM | POA: Diagnosis not present

## 2019-06-15 DIAGNOSIS — N2581 Secondary hyperparathyroidism of renal origin: Secondary | ICD-10-CM | POA: Diagnosis not present

## 2019-06-15 DIAGNOSIS — D631 Anemia in chronic kidney disease: Secondary | ICD-10-CM | POA: Diagnosis not present

## 2019-06-15 DIAGNOSIS — N186 End stage renal disease: Secondary | ICD-10-CM | POA: Diagnosis not present

## 2019-06-17 ENCOUNTER — Other Ambulatory Visit: Payer: Self-pay

## 2019-06-17 ENCOUNTER — Ambulatory Visit (INDEPENDENT_AMBULATORY_CARE_PROVIDER_SITE_OTHER): Payer: Medicare Other | Admitting: Physician Assistant

## 2019-06-17 ENCOUNTER — Encounter: Payer: Self-pay | Admitting: Orthopedic Surgery

## 2019-06-17 VITALS — Ht 70.0 in | Wt 220.8 lb

## 2019-06-17 DIAGNOSIS — I2510 Atherosclerotic heart disease of native coronary artery without angina pectoris: Secondary | ICD-10-CM

## 2019-06-17 DIAGNOSIS — B351 Tinea unguium: Secondary | ICD-10-CM | POA: Diagnosis not present

## 2019-06-17 NOTE — Progress Notes (Signed)
Office Visit Note   Patient: Anthony Bullock           Date of Birth: Apr 01, 1960           MRN: [id number] Visit Date: 06/17/2019              Requested by: [name], MD [place],  [place] [id number] PCP: [name], MD  Chief Complaint  Patient presents with  . Left Foot - Follow-up    Nail trim  . Right Leg - Follow-up    Prosthetic & BKA check      HPI: This pleasant gentleman who is status post right below-knee amputation.  With regards to that he is doing quite well.  He is here for a nail trimming of his left foot he has a history of onychomycotic nails  Assessment & Plan: Visit Diagnoses: No diagnosis found.  Plan: Follow-up in 3 months for repeat nail trim  Follow-Up Instructions: No follow-ups on file.   Ortho Exam  Patient is alert, oriented, no adenopathy, well-dressed, normal affect, normal respiratory effort. Right below-knee amputation stump is in good position well-healed surgical incision no erythema no drainage minimal swelling left foot onychomycotic nails after obtaining verbal consent these were trimmed to a healthy surface  Imaging: No results found. No images are attached to the encounter.  Labs: Lab Results  Component Value Date   HGBA1C 8.9 (H) 08/06/2018   HGBA1C 11.6 02/15/2018   HGBA1C 10.7 (A) 11/07/2017   ESRSEDRATE 94 (H) 03/30/2016   ESRSEDRATE 18 11/30/2012   CRP 16.1 (H) 03/30/2016   REPTSTATUS 04/05/2016 FINAL 03/31/2016   REPTSTATUS 04/04/2016 FINAL 03/31/2016   GRAMSTAIN  03/31/2016    RARE WBC PRESENT, PREDOMINANTLY PMN NO ORGANISMS SEEN    CULT NO ANAEROBES ISOLATED 03/31/2016   CULT  03/31/2016    RARE STENOTROPHOMONAS MALTOPHILIA RARE ENTEROBACTER SPECIES    LABORGA STENOTROPHOMONAS MALTOPHILIA 03/31/2016   LABORGA ENTEROBACTER SPECIES 03/31/2016     Lab Results  Component Value Date   ALBUMIN 3.9 05/08/2019   ALBUMIN 3.2 (L) 01/25/2017   ALBUMIN 2.5 (L) 04/27/2016    Lab  Results  Component Value Date   MG 2.1 12/03/2014   MG 2.1 11/04/2014   MG 2.1 03/30/2012   No results found for: VD25OH  No results found for: PREALBUMIN CBC EXTENDED Latest Ref Rng & Units 08/06/2018 11/06/2017 04/27/2016  WBC 4.0 - 10.5 K/uL 7.3 9.0 7.8  RBC 4.22 - 5.81 MIL/uL 4.32 - 3.36(L)  HGB 13.0 - 17.0 g/dL 12.6(L) 13.8 8.7(L)  HCT 39.0 - 52.0 % 40.4 41 27.7(L)  PLT 150 - 400 K/uL 153 131(A) 238  NEUTROABS 1.7 - 7.7 K/uL 4.7 7 -  LYMPHSABS 0.7 - 4.0 K/uL 1.6 - -     Body mass index is 31.68 kg/m.  Orders:  No orders of the defined types were placed in this encounter.  No orders of the defined types were placed in this encounter.    Procedures: No procedures performed  Clinical Data: No additional findings.  ROS:  All other systems negative, except as noted in the HPI. Review of Systems  Objective: Vital Signs: Ht _0  (1.778 m)   Wt 220 lb 12.8 oz (100.2 kg)   BMI 31.68 kg/m   Specialty Comments:  No specialty comments available.  PMFS History: Patient Active Problem List   Diagnosis Date Noted  . Onychomycosis 08/16/2016  . Status post unilateral below knee amputation, right ([place])  04/26/2016  . Bilateral carotid bruits 03/30/2016  . Diabetes mellitus with complication ([place])   . Anemia due to chronic kidney disease   . ESRD (end stage renal disease) on dialysis ([place]) 11/30/2015  . Acute respiratory failure with hypoxia ([place]) 11/29/2015  . At high risk for falls 09/29/2014  . Peripheral vascular disease ([place]) 09/29/2014  . Osteoarthritis of both knees 01/07/2014  . Osteoarthritis of multiple joints 10/06/2013  . NICM (nonischemic cardiomyopathy) ([place]) 06/27/2013  . CAD (coronary artery disease) 03/13/2013  . Abnormal nuclear stress test 01/27/2013  . Chronic systolic congestive heart failure ([place]) 01/27/2013  . COPD (chronic obstructive pulmonary disease) ([place]) 12/27/2012  . Diabetic peripheral neuropathy ([place]) 02/10/2012  .  PYELITIS/PYELONEPHRITIS DISEASES CLASSIFIED ELSW 09/28/2009  . URINARY CALCULUS 09/28/2009  . Adjustment disorder with depressed mood 08/21/2009  . Poorly controlled type II diabetes mellitus with renal complication ([place]) 67/61/9509  . Hyperlipidemia 04/14/2008  . Essential hypertension 04/14/2008  . ALLERGIC RHINITIS 04/14/2008   Past Medical History:  Diagnosis Date  . 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 Allergic rhinitis   . Anemia   . Arthritis   . Chronic systolic heart failure ([place])    a. ECHO (12/2012) EF 25-30%, HK entireanteroseptal myocardium //  b.  EF 25%, diffuse HK, grade 1 diastolic dysfunction, MAC, mild LAE, normal RVSF, trivial pericardial effusion  . COPD (chronic obstructive pulmonary disease) ([place])   . Diabetes mellitus type II   . Diabetic nephropathy ([place])   . Diabetic neuropathy ([place])   . ESRD on hemodialysis [facility])    started HD June 2017, goes to [facility] HD unit, Dr Hinda Lenis  . History of cardiac catheterization    a.Myoview 1/15:  There is significant left ventricular dysfunction. There may be slight scar at the apex. There is no significant ischemia. LV Ejection Fraction: 27%  //  b. RHC/LHC (1/15) with mean RA 6, PA 47/22 mean 33, mean PCWP 20, PVR 2.5 WU, CI 2.5; 80% dLAD stenosis, 70% diffuse large D.   . History of kidney stones   . Hyperlipidemia   . Hypertension   . Kidney stones   . NICM (nonischemic cardiomyopathy) ([place])    Primarily nonischemic. Echo (12/14) with EF 25-30%. Echo (3/15) with EF 25%, mild to moderately dilated LV, normal RV size and systolic function.   . Pneumonia   . Urethral stricture     Family History  Problem Relation Age of Onset  . Bladder Cancer Mother   . Alcohol abuse Father   . Melanoma Father   . Stroke Maternal Grandmother   . Heart Problems Maternal Grandmother        unknown  . Diabetes Maternal Grandmother   . Heart disease Maternal Grandfather   . Prostate  cancer Maternal Grandfather     Past Surgical History:  Procedure Laterality Date  . ABDOMINAL AORTOGRAM W/LOWER EXTREMITY N/A 03/30/2016   Procedure: Abdominal Aortogram w/Lower Extremity;  Surgeon: Angelia Mould, MD;  Location: [place] CV LAB;  Service: Cardiovascular;  Laterality: N/A;  . AMPUTATION Right 04/26/2016   Procedure: Right Below Knee Amputation;  Surgeon: Newt Minion, MD;  Location: [place];  Service: Orthopedics;  Laterality: Right;  . AV FISTULA PLACEMENT Right 09/08/2015   Procedure: INSERTION OF 4-67m x 45cm  ARTERIOVENOUS (AV) GORE-TEX GRAFT RIGHT UPPER  ARM;  Surgeon: CAngelia Mould MD;  Location: [place]  Service: Vascular;  Laterality: Right;  . AV FISTULA PLACEMENT Left 01/14/2016  Procedure: CREATION OF LEFT UPPER ARM ARTERIOVENOUS FISTULA;  Surgeon: Angelia Mould, MD;  Location: [place];  Service: Vascular;  Laterality: Left;  . BASCILIC VEIN TRANSPOSITION Right 08/22/2014   Procedure: RIGHT UPPER ARM BASCILIC VEIN TRANSPOSITION;  Surgeon: Angelia Mould, MD;  Location: [place];  Service: Vascular;  Laterality: Right;  . BELOW KNEE LEG AMPUTATION Right 04/26/2016  . CARDIAC CATHETERIZATION    . CARDIAC DEFIBRILLATOR PLACEMENT  06/27/2013   Sub Q       BY DR Caryl Comes  . CATARACT EXTRACTION W/PHACO Right 08/06/2018   Procedure: CATARACT EXTRACTION PHACO AND INTRAOCULAR LENS PLACEMENT (IOC);  Surgeon: Baruch Goldmann, MD;  [facility];  Service: Ophthalmology;  Laterality: Right;  CDE: 4.06  . CATARACT EXTRACTION W/PHACO Left 08/20/2018   Procedure: CATARACT EXTRACTION PHACO AND INTRAOCULAR LENS PLACEMENT (IOC);  Surgeon: Baruch Goldmann, MD;  [facility];  Service: Ophthalmology;  Laterality: Left;  CDE: 6.76  . COLONOSCOPY WITH PROPOFOL N/A 07/22/2015   Procedure: COLONOSCOPY WITH PROPOFOL;  Surgeon: Doran Stabler, MD;  [facility];  Service: Gastroenterology;  Laterality: N/A;  . FEMORAL-POPLITEAL BYPASS GRAFT Right 03/31/2016     Procedure: BYPASS GRAFT FEMORAL-POPLITEAL ARTERY USING RIGHT GREATER SAPHENOUS NONREVERSED VEIN;  Surgeon: Angelia Mould, MD;  Location: [place];  Service: Vascular;  Laterality: Right;  . HERNIA REPAIR    . I & D EXTREMITY Right 03/31/2016   Procedure: IRRIGATION AND DEBRIDEMENT FOOT;  Surgeon: Angelia Mould, MD;  Location: [place];  Service: Vascular;  Laterality: Right;  . IMPLANTABLE CARDIOVERTER DEFIBRILLATOR IMPLANT N/A 06/27/2013   Procedure: SUB Q ICD;  Surgeon: Deboraha Sprang, MD;  Location: [facility] CATH LAB;  Service: Cardiovascular;  Laterality: N/A;  . INTRAOPERATIVE ARTERIOGRAM Right 03/31/2016   Procedure: INTRA OPERATIVE ARTERIOGRAM;  Surgeon: Angelia Mould, MD;  Location: [place];  Service: Vascular;  Laterality: Right;  . IR GENERIC HISTORICAL Right 11/30/2015   IR THROMBECTOMY AV FISTULA W/THROMBOLYSIS/PTA INC/SHUNT/IMG RIGHT 11/30/2015 Aletta Edouard, MD [facility]  . IR GENERIC HISTORICAL  11/30/2015   IR US GUIDE VASC ACCESS RIGHT 11/30/2015 Aletta Edouard, MD [facility]  . IR GENERIC HISTORICAL Right 12/15/2015   IR THROMBECTOMY AV FISTULA W/THROMBOLYSIS/PTA/STENT INC/SHUNT/IMG RT 12/15/2015 Arne Cleveland, MD [facility]  . IR GENERIC HISTORICAL  12/15/2015   IR US GUIDE VASC ACCESS RIGHT 12/15/2015 Arne Cleveland, MD [facility]  . IR GENERIC HISTORICAL  12/28/2015   IR FLUORO GUIDE CV LINE RIGHT 12/28/2015 Marybelle Killings, MD [facility]  . IR GENERIC HISTORICAL  12/28/2015   IR US GUIDE VASC ACCESS RIGHT 12/28/2015 Marybelle Killings, MD [facility]  . LEFT A ND RIGHT HEART CATH  01/30/2013   DR Sung Amabile  . LEFT AND RIGHT HEART CATHETERIZATION WITH CORONARY ANGIOGRAM N/A 01/30/2013   Procedure: LEFT AND RIGHT HEART CATHETERIZATION WITH CORONARY ANGIOGRAM;  Surgeon: Jolaine Artist, MD;  Location: [facility] CATH LAB;  Service: Cardiovascular;  Laterality: N/A;  . PERIPHERAL VASCULAR CATHETERIZATION Right 01/26/2015   Procedure: A/V Fistulagram;  Surgeon:  Angelia Mould, MD;  Location: [place] CV LAB;  Service: Cardiovascular;  Laterality: Right;  . reapea urethral surgery for recurrent obstruction  2011  . TOTAL KNEE ARTHROPLASTY Right 2007  . VEIN HARVEST Right 03/31/2016   Procedure: RIGHT GREATER SAPHENOUS VEIN HARVEST;  Surgeon: Angelia Mould, MD;  Location: [place];  Service: Vascular;  Laterality: Right;   Social History   Occupational History  . Not on file  Tobacco Use  . Smoking status: Former Smoker    Packs/day: 2.00    Years: 32.00    Pack years: 64.00    Types: Cigarettes    Quit date: 05/11/2010    Years since quitting: 9.1  . Smokeless tobacco: Never Used  Substance and Sexual Activity  . Alcohol use: No  . Drug use: No  . Sexual activity: Yes

## 2019-06-18 DIAGNOSIS — D631 Anemia in chronic kidney disease: Secondary | ICD-10-CM | POA: Diagnosis not present

## 2019-06-18 DIAGNOSIS — Z992 Dependence on renal dialysis: Secondary | ICD-10-CM | POA: Diagnosis not present

## 2019-06-18 DIAGNOSIS — N2581 Secondary hyperparathyroidism of renal origin: Secondary | ICD-10-CM | POA: Diagnosis not present

## 2019-06-18 DIAGNOSIS — N186 End stage renal disease: Secondary | ICD-10-CM | POA: Diagnosis not present

## 2019-06-20 DIAGNOSIS — D631 Anemia in chronic kidney disease: Secondary | ICD-10-CM | POA: Diagnosis not present

## 2019-06-20 DIAGNOSIS — Z992 Dependence on renal dialysis: Secondary | ICD-10-CM | POA: Diagnosis not present

## 2019-06-20 DIAGNOSIS — N186 End stage renal disease: Secondary | ICD-10-CM | POA: Diagnosis not present

## 2019-06-20 DIAGNOSIS — N2581 Secondary hyperparathyroidism of renal origin: Secondary | ICD-10-CM | POA: Diagnosis not present

## 2019-06-22 DIAGNOSIS — Z992 Dependence on renal dialysis: Secondary | ICD-10-CM | POA: Diagnosis not present

## 2019-06-22 DIAGNOSIS — N186 End stage renal disease: Secondary | ICD-10-CM | POA: Diagnosis not present

## 2019-06-22 DIAGNOSIS — D631 Anemia in chronic kidney disease: Secondary | ICD-10-CM | POA: Diagnosis not present

## 2019-06-22 DIAGNOSIS — N2581 Secondary hyperparathyroidism of renal origin: Secondary | ICD-10-CM | POA: Diagnosis not present

## 2019-06-25 DIAGNOSIS — D631 Anemia in chronic kidney disease: Secondary | ICD-10-CM | POA: Diagnosis not present

## 2019-06-25 DIAGNOSIS — N2581 Secondary hyperparathyroidism of renal origin: Secondary | ICD-10-CM | POA: Diagnosis not present

## 2019-06-25 DIAGNOSIS — Z992 Dependence on renal dialysis: Secondary | ICD-10-CM | POA: Diagnosis not present

## 2019-06-25 DIAGNOSIS — N186 End stage renal disease: Secondary | ICD-10-CM | POA: Diagnosis not present

## 2019-06-25 NOTE — Progress Notes (Signed)
Electrophysiology Office Note Date: 06/26/2019  ID:  Anthony Bullock, Anthony Bullock May 21, 1960, MRN [id number]  PCP: [name], MD Primary Cardiologist: Carlyle Dolly, MD Electrophysiologist: Virl Axe, MD   CC: Routine ICD follow-up  Anthony Bullock is a 59 y.o. male seen today for Virl Axe, MD for routine electrophysiology followup.  Since last being seen in our clinic the patient reports doing very well. He is tolerating HD T/Th/Saturday. He has low BPs at times during HD but overall stable.  he denies chest pain, palpitations, dyspnea, PND, orthopnea, nausea, vomiting, dizziness, syncope, edema, weight gain, or early satiety. He has not had ICD shocks.   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 Allergic rhinitis   . Anemia   . Arthritis   . Chronic systolic heart failure ([place])    a. ECHO (12/2012) EF 25-30%, HK entireanteroseptal myocardium //  b.  EF 25%, diffuse HK, grade 1 diastolic dysfunction, MAC, mild LAE, normal RVSF, trivial pericardial effusion  . COPD (chronic obstructive pulmonary disease) ([place])   . Diabetes mellitus type II   . Diabetic nephropathy ([place])   . Diabetic neuropathy ([place])   . ESRD on hemodialysis [facility])    started HD June 2017, goes to [facility] HD unit, Dr Hinda Lenis  . History of cardiac catheterization    a.Myoview 1/15:  There is significant left ventricular dysfunction. There may be slight scar at the apex. There is no significant ischemia. LV Ejection Fraction: 27%  //  b. RHC/LHC (1/15) with mean RA 6, PA 47/22 mean 33, mean PCWP 20, PVR 2.5 WU, CI 2.5; 80% dLAD stenosis, 70% diffuse large D.   . History of kidney stones   . Hyperlipidemia   . Hypertension   . Kidney stones   . NICM (nonischemic cardiomyopathy) ([place])    Primarily nonischemic. Echo (12/14) with EF 25-30%. Echo (3/15) with EF  25%, mild to moderately dilated LV, normal RV size and systolic function.   . Pneumonia   . Urethral stricture    Past Surgical History:  Procedure Laterality Date  . ABDOMINAL AORTOGRAM W/LOWER EXTREMITY N/A 03/30/2016   Procedure: Abdominal Aortogram w/Lower Extremity;  Surgeon: Angelia Mould, MD;  Location: [place] [facility];  Service: Cardiovascular;  Laterality: N/A;  . AMPUTATION Right 04/26/2016   Procedure: Right Below Knee Amputation;  Surgeon: Newt Minion, MD;  Location: [place];  Service: Orthopedics;  Laterality: Right;  . AV FISTULA PLACEMENT Right 09/08/2015   Procedure: INSERTION OF 4-32m x 45cm  ARTERIOVENOUS (AV) GORE-TEX GRAFT RIGHT UPPER  ARM;  Surgeon: CAngelia Mould MD;  [place]  Service: Vascular;  Laterality: Right;  . AV FISTULA PLACEMENT Left 01/14/2016   Procedure: CREATION OF LEFT UPPER ARM ARTERIOVENOUS FISTULA;  Surgeon: CAngelia Mould MD;  [place]  Service: Vascular;  Laterality: Left;  . BASCILIC VEIN TRANSPOSITION Right 08/22/2014   Procedure: RIGHT UPPER ARM BASCILIC VEIN TRANSPOSITION;  Surgeon: CAngelia Mould MD;  [place]  Service: Vascular;  Laterality: Right;  . BELOW KNEE LEG AMPUTATION Right 04/26/2016  . CARDIAC CATHETERIZATION    . CARDIAC DEFIBRILLATOR PLACEMENT  06/27/2013   Sub Q       BY DR KCaryl Comes . CATARACT EXTRACTION W/PHACO Right 08/06/2018   Procedure: CATARACT EXTRACTION PHACO AND INTRAOCULAR LENS PLACEMENT (IOC);  Surgeon: WBaruch Goldmann  MD;  Location: [facility];  Service: Ophthalmology;  Laterality: Right;  CDE: 4.06  . CATARACT EXTRACTION W/PHACO Left 08/20/2018   Procedure: CATARACT EXTRACTION PHACO AND INTRAOCULAR LENS PLACEMENT (IOC);  Surgeon: Baruch Goldmann, MD;  Location: [facility];  Service: Ophthalmology;  Laterality: Left;  CDE: 6.76  . COLONOSCOPY WITH PROPOFOL N/A 07/22/2015   Procedure: COLONOSCOPY WITH PROPOFOL;  Surgeon: Doran Stabler, MD;  [facility];  Service:  Gastroenterology;  Laterality: N/A;  . FEMORAL-POPLITEAL BYPASS GRAFT Right 03/31/2016   Procedure: BYPASS GRAFT FEMORAL-POPLITEAL ARTERY USING RIGHT GREATER SAPHENOUS NONREVERSED VEIN;  Surgeon: Angelia Mould, MD;  Location: [place];  Service: Vascular;  Laterality: Right;  . HERNIA REPAIR    . I & D EXTREMITY Right 03/31/2016   Procedure: IRRIGATION AND DEBRIDEMENT FOOT;  Surgeon: Angelia Mould, MD;  Location: [place];  Service: Vascular;  Laterality: Right;  . IMPLANTABLE CARDIOVERTER DEFIBRILLATOR IMPLANT N/A 06/27/2013   Procedure: SUB Q ICD;  Surgeon: Deboraha Sprang, MD;  Location: [facility] CATH LAB;  Service: Cardiovascular;  Laterality: N/A;  . INTRAOPERATIVE ARTERIOGRAM Right 03/31/2016   Procedure: INTRA OPERATIVE ARTERIOGRAM;  Surgeon: Angelia Mould, MD;  Location: [place];  Service: Vascular;  Laterality: Right;  . IR GENERIC HISTORICAL Right 11/30/2015   IR THROMBECTOMY AV FISTULA W/THROMBOLYSIS/PTA INC/SHUNT/IMG RIGHT 11/30/2015 Aletta Edouard, MD [facility]  . IR GENERIC HISTORICAL  11/30/2015   IR US GUIDE VASC ACCESS RIGHT 11/30/2015 Aletta Edouard, MD [facility]  . IR GENERIC HISTORICAL Right 12/15/2015   IR THROMBECTOMY AV FISTULA W/THROMBOLYSIS/PTA/STENT INC/SHUNT/IMG RT 12/15/2015 Arne Cleveland, MD [facility]  . IR GENERIC HISTORICAL  12/15/2015   IR US GUIDE VASC ACCESS RIGHT 12/15/2015 Arne Cleveland, MD [facility]  . IR GENERIC HISTORICAL  12/28/2015   IR FLUORO GUIDE CV LINE RIGHT 12/28/2015 Marybelle Killings, MD [facility]  . IR GENERIC HISTORICAL  12/28/2015   IR US GUIDE VASC ACCESS RIGHT 12/28/2015 Marybelle Killings, MD [facility]  . LEFT A ND RIGHT HEART CATH  01/30/2013   DR Sung Amabile  . LEFT AND RIGHT HEART CATHETERIZATION WITH CORONARY ANGIOGRAM N/A 01/30/2013   Procedure: LEFT AND RIGHT HEART CATHETERIZATION WITH CORONARY ANGIOGRAM;  Surgeon: Jolaine Artist, MD;  Location: [facility];  Service: Cardiovascular;  Laterality: N/A;  . PERIPHERAL  VASCULAR CATHETERIZATION Right 01/26/2015   Procedure: A/V Fistulagram;  Surgeon: Angelia Mould, MD;  Location: [place] [facility];  Service: Cardiovascular;  Laterality: Right;  . reapea urethral surgery for recurrent obstruction  2011  . TOTAL KNEE ARTHROPLASTY Right 2007  . VEIN HARVEST Right 03/31/2016   Procedure: RIGHT GREATER SAPHENOUS VEIN HARVEST;  Surgeon: Angelia Mould, MD;  Location: [facility] OR;  Service: Vascular;  Laterality: Right;    Current Outpatient Medications  Medication Sig Dispense Refill  . albuterol (PROVENTIL HFA;VENTOLIN HFA) 108 (90 Base) MCG/ACT inhaler Inhale 2 puffs into the lungs every 4 (four) hours as needed for wheezing or shortness of breath. 1 Inhaler 1  . aspirin EC 81 MG tablet Take 81 mg by mouth daily.     [name] atorvastatin (LIPITOR) 80 MG tablet Take 1 tablet (80 mg total) by mouth at bedtime. 90 tablet 3  . azelastine (ASTELIN) 0.1 % nasal spray Place 2 sprays into both nostrils 3 (three) times daily as needed for rhinitis. Use in each nostril as directed 30 mL 12  . carvedilol (COREG) 12.5 MG tablet Take 1 tablet (12.5 mg total) by mouth 2 (two)  times daily with a meal. 180 tablet 3  . Continuous Blood Gluc Sensor (FREESTYLE LIBRE 14 DAY SENSOR) MISC Inject 1 Device into the skin every 14 (fourteen) days. 2 each 3  . fenofibrate 160 MG tablet Take 1 tablet (160 mg total) by mouth daily. 90 tablet 3  . fexofenadine (ALLEGRA) 180 MG tablet Take 180 mg by mouth daily.    . furosemide (LASIX) 40 MG tablet Take 3 tablets by mouth once daily 270 tablet 0  . gabapentin (NEURONTIN) 300 MG capsule Take 2 capsules (600 mg total) by mouth 2 (two) times daily. 360 capsule 3  . glucose blood test strip Check 1 time daily. E11.9 One Touch Ultra Blue Test Strips 100 each 3  . Insulin Glargine (BASAGLAR KWIKPEN) 100 UNIT/ML INJECT 70 UNITS SUBCUTANEOUSLY AT BEDTIME 15 mL 11  . Insulin Lispro (HUMALOG KWIKPEN) 200 UNIT/ML SOPN Inject 10 Units into the skin 3  (three) times daily with meals. Use tid with meals as per sliding scale 4 pen 6  . Insulin Pen Needle (BD PEN NEEDLE NANO U/F) 32G X 4 MM MISC USE 1 PEN NEEDLE SUBCUTANEOUSLY WITH INSULIN 4 TIMES DAILY 400 each 0  . lanthanum (FOSRENOL) 1000 MG chewable tablet Chew 1,000 mg by mouth 3 (three) times daily with meals. Take one tablet after each meal.     . multivitamin (RENA-VIT) TABS tablet Take 1 tablet by mouth daily. 90 tablet 3  . Olopatadine HCl (PATADAY) 0.2 % SOLN Place 1 drop into both eyes daily as needed (for allergies).      No current facility-administered medications for this visit.    Allergies:   Morphine sulfate   Social History: Social History   Socioeconomic History  . Marital status: Married    Spouse name: Not on file  . Number of children: 0  . Years of education: Not on file  . Highest education level: Not on file  Occupational History  . Not on file  Tobacco Use  . Smoking status: Former Smoker    Packs/day: 2.00    Years: 32.00    Pack years: 64.00    Types: Cigarettes    Quit date: 05/11/2010    Years since quitting: 9.1  . Smokeless tobacco: Never Used  Vaping Use  . Vaping Use: Never used  Substance and Sexual Activity  . Alcohol use: No  . Drug use: No  . Sexual activity: Yes  Other Topics Concern  . 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 Difficulty of Paying Living Expenses:   Food Insecurity:   . Worried About Charity fundraiser in the Last Year:   . Arboriculturist in the Last Year:   Transportation Needs:   . Film/video editor (Medical):   [name] Lack of Transportation (Non-Medical):   Physical Activity:   . Days of Exercise per Week:   . Minutes of Exercise per Session:   Stress:   . Feeling of Stress :   Social Connections:   . Frequency of Communication with Friends and Family:   . Frequency of Social Gatherings with Friends and  Family:   . Attends Religious Services:   . Active Member of Clubs or Organizations:   . Attends Archivist Meetings:   [name] Marital Status:   Intimate Partner Violence:   . Fear of Current or Ex-Partner:   .  Emotionally Abused:   [name] Physically Abused:   . Sexually Abused:     Family History: Family History  Problem Relation Age of Onset  . Bladder Cancer Mother   . Alcohol abuse Father   . Melanoma Father   . Stroke Maternal Grandmother   . Heart Problems Maternal Grandmother        unknown  . Diabetes Maternal Grandmother   . Heart disease Maternal Grandfather   . Prostate cancer Maternal Grandfather     Review of Systems: All other systems reviewed and are otherwise negative except as noted above.   Physical Exam: Vitals:   06/26/19 1232  BP: 138/70  Pulse: 79  SpO2: 96%  Weight: 221 lb 3.2 oz (100.3 kg)  Height: _0  (1.778 m)     GEN- The patient is well appearing, alert and oriented x 3 today.   HEENT: normocephalic, atraumatic; sclera clear, conjunctiva pink; hearing intact; oropharynx clear; neck supple, no JVP Lymph- no cervical lymphadenopathy Lungs- Clear to ausculation bilaterally, normal work of breathing.  No wheezes, rales, rhonchi Heart- Regular rate and rhythm, no murmurs, rubs or gallops, PMI not laterally displaced GI- soft, non-tender, non-distended, bowel sounds present, no hepatosplenomegaly Extremities- no clubbing, cyanosis, or edema; DP/PT/radial pulses 2+ bilaterally MS- no significant deformity or atrophy Skin- warm and dry, no rash or lesion; ICD pocket well healed Psych- euthymic mood, full affect Neuro- strength and sensation are intact  ICD interrogation- reviewed in detail today,  See PACEART report  EKG:  EKG is ordered today. The ekg ordered today shows NSR at 79 bpm  Recent Labs: 08/06/2018: BUN 68; Creatinine, Ser 10.48; Hemoglobin 12.6; Platelets 153; Potassium 4.8; Sodium 133 05/08/2019: ALT 22   Wt Readings from  Last 3 Encounters:  06/26/19 221 lb 3.2 oz (100.3 kg)  06/17/19 220 lb 12.8 oz (100.2 kg)  05/08/19 220 lb 12.8 oz (100.2 kg)     Other studies Reviewed: Additional studies/ records that were reviewed today include: Previous EP office notes.   Assessment and Plan:  1.  Chronic systolic dysfunction s/p Environmental education officer  euvolemic today Stable on an appropriate medical regimen Normal ICD function See Pace Art report No changes today  2. NICM  3. HTN Labile in the setting of HD. No change today  4. CAD Denies ischemic symptoms Continue ASA, statin, and BB.  5. ESRD on HD HD on T/Th/Saturdays  Current medicines are reviewed at length with the patient today.   The patient does not have concerns regarding his medicines.  The following changes were made today:  none  Labs/ tests ordered today include:  Orders Placed This Encounter  Procedures  . EKG 12-Lead   Disposition:   Follow up with EP APP in 3 months, 6 months with Dr. Caryl Comes. He is unable to come on Tuesday/Thursday due to HD.   Jacalyn Lefevre, PA-C  06/26/2019 12:58 PM  [place] [id number] [phone] (office) [phone] (fax)

## 2019-06-26 ENCOUNTER — Encounter: Payer: Self-pay | Admitting: Student

## 2019-06-26 ENCOUNTER — Other Ambulatory Visit: Payer: Self-pay

## 2019-06-26 ENCOUNTER — Ambulatory Visit (INDEPENDENT_AMBULATORY_CARE_PROVIDER_SITE_OTHER): Payer: Medicare Other | Admitting: Student

## 2019-06-26 VITALS — BP 138/70 | HR 79 | Ht 70.0 in | Wt 221.2 lb

## 2019-06-26 DIAGNOSIS — Z992 Dependence on renal dialysis: Secondary | ICD-10-CM

## 2019-06-26 DIAGNOSIS — I10 Essential (primary) hypertension: Secondary | ICD-10-CM

## 2019-06-26 DIAGNOSIS — I428 Other cardiomyopathies: Secondary | ICD-10-CM

## 2019-06-26 DIAGNOSIS — I2510 Atherosclerotic heart disease of native coronary artery without angina pectoris: Secondary | ICD-10-CM

## 2019-06-26 DIAGNOSIS — I5022 Chronic systolic (congestive) heart failure: Secondary | ICD-10-CM | POA: Diagnosis not present

## 2019-06-26 DIAGNOSIS — N186 End stage renal disease: Secondary | ICD-10-CM

## 2019-06-26 DIAGNOSIS — Z4502 Encounter for adjustment and management of automatic implantable cardiac defibrillator: Secondary | ICD-10-CM | POA: Diagnosis not present

## 2019-06-26 LAB — CUP PACEART INCLINIC DEVICE CHECK
Date Time Interrogation Session: 20210616125606
Implantable Pulse Generator Implant Date: 20150618
Pulse Gen Model: 1010
Pulse Gen Serial Number: 111255

## 2019-06-26 NOTE — Patient Instructions (Signed)
Medication Instructions:  *If you need a refill on your cardiac medications before your next appointment, please call your pharmacy*   Lab Work: If you have labs (blood work) drawn today and your tests are completely normal, you will receive your results only by: [name] MyChart Message (if you have MyChart) OR . A paper copy in the mail If you have any lab test that is abnormal or we need to change your treatment, we will call you to review the results.   Testing/Procedures: NONE  Follow-Up: At [facility], you and your health needs are our priority.  As part of our continuing mission to provide you with exceptional heart care, we have created designated Provider Care Teams.  These Care Teams include your primary Cardiologist (physician) and Advanced Practice Providers (APPs -  Physician Assistants and Nurse Practitioners) who all work together to provide you with the care you need, when you need it.  We recommend signing up for the patient portal called "MyChart".  Sign up information is provided on this After Visit Summary.  MyChart is used to connect with patients for Virtual Visits (Telemedicine).  Patients are able to view lab/test results, encounter notes, upcoming appointments, etc.  Non-urgent messages can be sent to your provider as well.   To learn more about what you can do with MyChart, go to [email or web address].    Your next appointment:   Your physician recommends that you schedule a follow-up appointment in 3 MONTHS with Oda Kilts, PA-C for device check only  Your physician wants you to follow-up in: 6 MONTHS with Dr. Caryl Comes. You will receive a reminder letter in the mail two months in advance. If you don't receive a letter, please call our office to schedule the follow-up appointment.   The format for your next appointment:   In Person  Provider:   Legrand Como "Oda Kilts, PA-C and Dr. Virl Axe, MD   Other Instructions

## 2019-06-27 DIAGNOSIS — D631 Anemia in chronic kidney disease: Secondary | ICD-10-CM | POA: Diagnosis not present

## 2019-06-27 DIAGNOSIS — N2581 Secondary hyperparathyroidism of renal origin: Secondary | ICD-10-CM | POA: Diagnosis not present

## 2019-06-27 DIAGNOSIS — Z992 Dependence on renal dialysis: Secondary | ICD-10-CM | POA: Diagnosis not present

## 2019-06-27 DIAGNOSIS — N186 End stage renal disease: Secondary | ICD-10-CM | POA: Diagnosis not present

## 2019-06-29 DIAGNOSIS — D631 Anemia in chronic kidney disease: Secondary | ICD-10-CM | POA: Diagnosis not present

## 2019-06-29 DIAGNOSIS — Z992 Dependence on renal dialysis: Secondary | ICD-10-CM | POA: Diagnosis not present

## 2019-06-29 DIAGNOSIS — N186 End stage renal disease: Secondary | ICD-10-CM | POA: Diagnosis not present

## 2019-06-29 DIAGNOSIS — N2581 Secondary hyperparathyroidism of renal origin: Secondary | ICD-10-CM | POA: Diagnosis not present

## 2019-07-01 ENCOUNTER — Emergency Department (HOSPITAL_COMMUNITY): Payer: Medicare Other

## 2019-07-01 ENCOUNTER — Other Ambulatory Visit: Payer: Self-pay

## 2019-07-01 ENCOUNTER — Encounter (HOSPITAL_COMMUNITY): Payer: Self-pay

## 2019-07-01 ENCOUNTER — Emergency Department (HOSPITAL_COMMUNITY)
Admission: EM | Admit: 2019-07-01 | Discharge: 2019-07-01 | Disposition: A | Discharge status: Home / Self Care | Payer: Medicare Other | Attending: Emergency Medicine | Admitting: Emergency Medicine

## 2019-07-01 DIAGNOSIS — I132 Hypertensive heart and chronic kidney disease with heart failure and with stage 5 chronic kidney disease, or end stage renal disease: Secondary | ICD-10-CM | POA: Diagnosis not present

## 2019-07-01 DIAGNOSIS — E875 Hyperkalemia: Secondary | ICD-10-CM | POA: Insufficient documentation

## 2019-07-01 DIAGNOSIS — E1140 Type 2 diabetes mellitus with diabetic neuropathy, unspecified: Secondary | ICD-10-CM | POA: Insufficient documentation

## 2019-07-01 DIAGNOSIS — Z992 Dependence on renal dialysis: Secondary | ICD-10-CM | POA: Insufficient documentation

## 2019-07-01 DIAGNOSIS — Z79899 Other long term (current) drug therapy: Secondary | ICD-10-CM | POA: Diagnosis not present

## 2019-07-01 DIAGNOSIS — E1122 Type 2 diabetes mellitus with diabetic chronic kidney disease: Secondary | ICD-10-CM | POA: Diagnosis not present

## 2019-07-01 DIAGNOSIS — I110 Hypertensive heart disease with heart failure: Secondary | ICD-10-CM | POA: Diagnosis not present

## 2019-07-01 DIAGNOSIS — Z7982 Long term (current) use of aspirin: Secondary | ICD-10-CM | POA: Insufficient documentation

## 2019-07-01 DIAGNOSIS — Z794 Long term (current) use of insulin: Secondary | ICD-10-CM | POA: Diagnosis not present

## 2019-07-01 DIAGNOSIS — Z87891 Personal history of nicotine dependence: Secondary | ICD-10-CM | POA: Diagnosis not present

## 2019-07-01 DIAGNOSIS — Z886 Allergy status to analgesic agent status: Secondary | ICD-10-CM | POA: Insufficient documentation

## 2019-07-01 DIAGNOSIS — I5022 Chronic systolic (congestive) heart failure: Secondary | ICD-10-CM | POA: Insufficient documentation

## 2019-07-01 DIAGNOSIS — J449 Chronic obstructive pulmonary disease, unspecified: Secondary | ICD-10-CM | POA: Insufficient documentation

## 2019-07-01 DIAGNOSIS — I2510 Atherosclerotic heart disease of native coronary artery without angina pectoris: Secondary | ICD-10-CM | POA: Insufficient documentation

## 2019-07-01 DIAGNOSIS — R0602 Shortness of breath: Secondary | ICD-10-CM | POA: Diagnosis present

## 2019-07-01 DIAGNOSIS — J189 Pneumonia, unspecified organism: Secondary | ICD-10-CM | POA: Diagnosis not present

## 2019-07-01 DIAGNOSIS — N186 End stage renal disease: Secondary | ICD-10-CM | POA: Insufficient documentation

## 2019-07-01 LAB — CBC WITH DIFFERENTIAL/PLATELET
Abs Immature Granulocytes: 0.03 10*3/uL (ref 0.00–0.07)
Basophils Absolute: 0.1 10*3/uL (ref 0.0–0.1)
Basophils Relative: 1 %
Eosinophils Absolute: 0.3 10*3/uL (ref 0.0–0.5)
Eosinophils Relative: 5 %
HCT: 34 % — ABNORMAL LOW (ref 39.0–52.0)
Hemoglobin: 10.7 g/dL — ABNORMAL LOW (ref 13.0–17.0)
Immature Granulocytes: 0 %
Lymphocytes Relative: 14 %
Lymphs Abs: 1 10*3/uL (ref 0.7–4.0)
MCH: 28.5 pg (ref 26.0–34.0)
MCHC: 31.5 g/dL (ref 30.0–36.0)
MCV: 90.7 fL (ref 80.0–100.0)
Monocytes Absolute: 0.6 10*3/uL (ref 0.1–1.0)
Monocytes Relative: 9 %
Neutro Abs: 5 10*3/uL (ref 1.7–7.7)
Neutrophils Relative %: 71 %
Platelets: 142 10*3/uL — ABNORMAL LOW (ref 150–400)
RBC: 3.75 MIL/uL — ABNORMAL LOW (ref 4.22–5.81)
RDW: 14.1 % (ref 11.5–15.5)
WBC: 7 10*3/uL (ref 4.0–10.5)
nRBC: 0 % (ref 0.0–0.2)

## 2019-07-01 LAB — COMPREHENSIVE METABOLIC PANEL
ALT: 39 U/L (ref 0–44)
AST: 28 U/L (ref 15–41)
Albumin: 3.5 g/dL (ref 3.5–5.0)
Alkaline Phosphatase: 98 U/L (ref 38–126)
Anion gap: 14 (ref 5–15)
BUN: 65 mg/dL — ABNORMAL HIGH (ref 6–20)
CO2: 26 mmol/L (ref 22–32)
Calcium: 8.8 mg/dL — ABNORMAL LOW (ref 8.9–10.3)
Chloride: 92 mmol/L — ABNORMAL LOW (ref 98–111)
Creatinine, Ser: 11.02 mg/dL — ABNORMAL HIGH (ref 0.61–1.24)
GFR calc Af Amer: 5 mL/min — ABNORMAL LOW (ref >60)
GFR calc non Af Amer: 5 mL/min — ABNORMAL LOW (ref >60)
Glucose, Bld: 331 mg/dL — ABNORMAL HIGH (ref 70–99)
Potassium: 5.9 mmol/L — ABNORMAL HIGH (ref 3.5–5.1)
Sodium: 132 mmol/L — ABNORMAL LOW (ref 135–145)
Total Bilirubin: 0.5 mg/dL (ref 0.3–1.2)
Total Protein: 7 g/dL (ref 6.5–8.1)

## 2019-07-01 LAB — BRAIN NATRIURETIC PEPTIDE: B Natriuretic Peptide: 441 pg/mL — ABNORMAL HIGH (ref 0.0–100.0)

## 2019-07-01 LAB — TROPONIN I (HIGH SENSITIVITY)
Troponin I (High Sensitivity): 53 ng/L — ABNORMAL HIGH (ref <18)
Troponin I (High Sensitivity): 63 ng/L — ABNORMAL HIGH (ref <18)

## 2019-07-01 LAB — CBG MONITORING, ED
Glucose-Capillary: 302 mg/dL — ABNORMAL HIGH (ref 70–99)
Glucose-Capillary: 268 mg/dL — ABNORMAL HIGH (ref 70–99)

## 2019-07-01 MED ORDER — AMOXICILLIN-POT CLAVULANATE 875-125 MG PO TABS
1.0000 | ORAL_TABLET | Freq: Two times a day (BID) | ORAL | 0 refills | Status: DC
Start: 2019-07-01 — End: 2019-07-11

## 2019-07-01 MED ORDER — CARVEDILOL 12.5 MG PO TABS: 12.5000 mg | ORAL_TABLET | Freq: Two times a day (BID) | ORAL | Status: DC

## 2019-07-01 MED ORDER — SODIUM ZIRCONIUM CYCLOSILICATE 5 G PO PACK
10.0000 g | PACK | Freq: Once | ORAL | Status: AC
  Administered 2019-07-01: 10 g via ORAL
  Filled 2019-07-01: qty 2

## 2019-07-01 MED ORDER — PREDNISONE 50 MG PO TABS
60.0000 mg | ORAL_TABLET | Freq: Once | ORAL | Status: AC
  Administered 2019-07-01: 60 mg via ORAL
  Filled 2019-07-01: qty 1

## 2019-07-01 MED ORDER — METHYLPREDNISOLONE SODIUM SUCC 125 MG IJ SOLR: 125.0000 mg | Freq: Once | INTRAMUSCULAR | Status: DC

## 2019-07-01 MED ORDER — CARVEDILOL 12.5 MG PO TABS
12.5000 mg | ORAL_TABLET | Freq: Once | ORAL | Status: AC
  Administered 2019-07-01: 12.5 mg via ORAL
  Filled 2019-07-01: qty 1

## 2019-07-01 MED ORDER — IPRATROPIUM BROMIDE HFA 17 MCG/ACT IN AERS: 2.0000 | INHALATION_SPRAY | Freq: Once | RESPIRATORY_TRACT | Status: DC

## 2019-07-01 MED ORDER — INSULIN ASPART 100 UNIT/ML ~~LOC~~ SOLN
8.0000 [IU] | Freq: Once | SUBCUTANEOUS | Status: AC
  Administered 2019-07-01: 8 [IU] via SUBCUTANEOUS
  Filled 2019-07-01: qty 1

## 2019-07-01 MED ORDER — DOXYCYCLINE HYCLATE 100 MG PO CAPS
100.0000 mg | ORAL_CAPSULE | Freq: Two times a day (BID) | ORAL | 0 refills | Status: AC
Start: 2019-07-01 — End: 2019-07-08

## 2019-07-01 MED ORDER — LOKELMA 10 G PO PACK: 10.0000 g | PACK | Freq: Once | ORAL | 0 refills | Status: AC

## 2019-07-01 MED ORDER — ALBUTEROL SULFATE HFA 108 (90 BASE) MCG/ACT IN AERS
2.0000 | INHALATION_SPRAY | Freq: Once | RESPIRATORY_TRACT | Status: AC
  Administered 2019-07-01: 2 via RESPIRATORY_TRACT
  Filled 2019-07-01: qty 6.7

## 2019-07-01 NOTE — ED Notes (Signed)
Pt refused IV at this time.pt reports "I don't see why I need one." pt aware that may need IV inserted depending on blood work results/scans ordered. Pt verbalized understanding.

## 2019-07-01 NOTE — ED Notes (Signed)
Pt taken to radiology at this time.

## 2019-07-01 NOTE — ED Triage Notes (Addendum)
Per EMS- Pt c/o SOB since this am upon awakening. Denies home use of oxygen. Used inhaler without improvement. Pt initially 89% room air. Pt 99% 2L [place] on arrival to ED.   Pt denies any complaints at this time. States SOB improved with supplemental O2.  PT last dialysis treatment 06/29/19. Due tomorrow.

## 2019-07-01 NOTE — ED Provider Notes (Signed)
[facility] Provider Note   CSN: [id number] Arrival date & time: 07/01/19  6948     History Chief Complaint  Patient presents with  . Shortness of Breath    Anthony Bullock is a 60 y.o. male with PMHx COPD, CHF, NICM with AICD in place, ESRD on dialysis [facility], Diabetes, HTN, HLD, who presents to the ED via EMS with complaint of SOB that began last night.  Patient reports that last night he began feeling mildly short of breath and had a productive cough.  He states he went to bed and woke up around 4:30 AM this morning with excessive coughing.  Patient states he felt more short of breath as well.  He took 2 puffs of his albuterol inhaler with mild improvement in his symptoms went back to bed however woke up again around 7:30 AM this morning with similar complaints.  He tried his inhaler again however this time did not have much relief, prompting him to call EMS.  Upon EMS arrival patient was found to have oxygen saturation 89% on room air, he was placed on 2 L nasal cannula with improvement to 99 to 100%.  Patient is not typically on O2 at home.  States he no longer feels short of breath with the oxygen in place.  He has no other complaints at this time including fevers, chills, chest pain, leg swelling, abdominal distention, wheezing, orthopnea, any other associated symptoms.  Patient dialysis patient and is scheduled to have dialysis treatment tomorrow.  He denies missing any appointments recently.  He is also on Lasix 120 mg daily and denies missing any doses. Pt denies feeling his AICD go off. He has been vaccinated for COVID 19 with Moderna vaccines.   The history is provided by the patient and medical records.       Past Medical History:  Diagnosis Date  . 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 Allergic rhinitis   . Anemia   . Arthritis   . Chronic systolic heart failure ([place])    a. ECHO (12/2012) EF 25-30%, HK  entireanteroseptal myocardium //  b.  EF 25%, diffuse HK, grade 1 diastolic dysfunction, MAC, mild LAE, normal RVSF, trivial pericardial effusion  . COPD (chronic obstructive pulmonary disease) ([place])   . Diabetes mellitus type II   . Diabetic nephropathy ([place])   . Diabetic neuropathy ([place])   . ESRD on hemodialysis [facility])    started HD June 2017, goes to [facility] HD unit, Dr Hinda Lenis  . History of cardiac catheterization    a.Myoview 1/15:  There is significant left ventricular dysfunction. There may be slight scar at the apex. There is no significant ischemia. LV Ejection Fraction: 27%  //  b. RHC/LHC (1/15) with mean RA 6, PA 47/22 mean 33, mean PCWP 20, PVR 2.5 WU, CI 2.5; 80% dLAD stenosis, 70% diffuse large D.   . History of kidney stones   . Hyperlipidemia   . Hypertension   . Kidney stones   . NICM (nonischemic cardiomyopathy) ([place])    Primarily nonischemic. Echo (12/14) with EF 25-30%. Echo (3/15) with EF 25%, mild to moderately dilated LV, normal RV size and systolic function.   . Pneumonia   . Urethral stricture     Patient Active Problem List   Diagnosis Date Noted  . Onychomycosis 08/16/2016  . Status post unilateral below knee amputation, right ([place]) 04/26/2016  . Bilateral carotid bruits 03/30/2016  . Diabetes mellitus  with complication ([place])   . Anemia due to chronic kidney disease   . ESRD (end stage renal disease) on dialysis ([place]) 11/30/2015  . Acute respiratory failure with hypoxia ([place]) 11/29/2015  . At high risk for falls 09/29/2014  . Peripheral vascular disease ([place]) 09/29/2014  . Osteoarthritis of both knees 01/07/2014  . Osteoarthritis of multiple joints 10/06/2013  . NICM (nonischemic cardiomyopathy) ([place]) 06/27/2013  . CAD (coronary artery disease) 03/13/2013  . Abnormal nuclear stress test 01/27/2013  . Chronic systolic congestive heart failure ([place]) 01/27/2013  . COPD (chronic obstructive pulmonary disease) ([place]) 12/27/2012  . Diabetic peripheral  neuropathy ([place]) 02/10/2012  . PYELITIS/PYELONEPHRITIS DISEASES CLASSIFIED ELSW 09/28/2009  . URINARY CALCULUS 09/28/2009  . Adjustment disorder with depressed mood 08/21/2009  . Poorly controlled type II diabetes mellitus with renal complication ([place]) 16/10/9602  . Hyperlipidemia 04/14/2008  . Essential hypertension 04/14/2008  . ALLERGIC RHINITIS 04/14/2008    Past Surgical History:  Procedure Laterality Date  . ABDOMINAL AORTOGRAM W/LOWER EXTREMITY N/A 03/30/2016   Procedure: Abdominal Aortogram w/Lower Extremity;  Surgeon: Angelia Mould, MD;  Location: [facility];  Service: Cardiovascular;  Laterality: N/A;  . AMPUTATION Right 04/26/2016   Procedure: Right Below Knee Amputation;  Surgeon: Newt Minion, MD;  Location: [facility];  Service: Orthopedics;  Laterality: Right;  . AV FISTULA PLACEMENT Right 09/08/2015   Procedure: INSERTION OF 4-23m x 45cm  ARTERIOVENOUS (AV) GORE-TEX GRAFT RIGHT UPPER  ARM;  Surgeon: CAngelia Mould MD;  [place] [facility]  Service: Vascular;  Laterality: Right;  . AV FISTULA PLACEMENT Left 01/14/2016   Procedure: CREATION OF LEFT UPPER ARM ARTERIOVENOUS FISTULA;  Surgeon: CAngelia Mould MD;  [place] [facility]  Service: Vascular;  Laterality: Left;  . BASCILIC VEIN TRANSPOSITION Right 08/22/2014   Procedure: RIGHT UPPER ARM BASCILIC VEIN TRANSPOSITION;  Surgeon: CAngelia Mould MD;  [facility]  Service: Vascular;  Laterality: Right;  . BELOW KNEE LEG AMPUTATION Right 04/26/2016  . CARDIAC CATHETERIZATION    . CARDIAC DEFIBRILLATOR PLACEMENT  06/27/2013   Sub Q       BY DR KCaryl Comes . CATARACT EXTRACTION W/PHACO Right 08/06/2018   Procedure: CATARACT EXTRACTION PHACO AND INTRAOCULAR LENS PLACEMENT (IOC);  Surgeon: WBaruch Goldmann MD;  [facility];  Service: Ophthalmology;  Laterality: Right;  CDE: 4.06  . CATARACT EXTRACTION W/PHACO Left 08/20/2018   Procedure: CATARACT EXTRACTION PHACO AND INTRAOCULAR LENS PLACEMENT (IOC);   Surgeon: WBaruch Goldmann MD;  Location: [facility];  Service: Ophthalmology;  Laterality: Left;  CDE: 6.76  . COLONOSCOPY WITH PROPOFOL N/A 07/22/2015   Procedure: COLONOSCOPY WITH PROPOFOL;  Surgeon: HDoran Stabler MD;  [facility];  Service: Gastroenterology;  Laterality: N/A;  . FEMORAL-POPLITEAL BYPASS GRAFT Right 03/31/2016   Procedure: BYPASS GRAFT FEMORAL-POPLITEAL ARTERY USING RIGHT GREATER SAPHENOUS NONREVERSED VEIN;  Surgeon: CAngelia Mould MD;  [facility]  Service: Vascular;  Laterality: Right;  . HERNIA REPAIR    . I & D EXTREMITY Right 03/31/2016   Procedure: IRRIGATION AND DEBRIDEMENT FOOT;  Surgeon: CAngelia Mould MD;  [place] [facility]  Service: Vascular;  Laterality: Right;  . IMPLANTABLE CARDIOVERTER DEFIBRILLATOR IMPLANT N/A 06/27/2013   Procedure: SUB Q ICD;  Surgeon: SDeboraha Sprang MD;  [facility];  Service: Cardiovascular;  Laterality: N/A;  . INTRAOPERATIVE ARTERIOGRAM Right 03/31/2016   Procedure: INTRA OPERATIVE ARTERIOGRAM;  Surgeon: CAngelia Mould MD;  [facility]  Service: Vascular;  Laterality:  Right;  Everlean Alstrom GENERIC HISTORICAL Right 11/30/2015   IR THROMBECTOMY AV FISTULA W/THROMBOLYSIS/PTA INC/SHUNT/IMG RIGHT 11/30/2015 Aletta Edouard, MD [facility]  . IR GENERIC HISTORICAL  11/30/2015   IR US GUIDE VASC ACCESS RIGHT 11/30/2015 Aletta Edouard, MD [facility]  . IR GENERIC HISTORICAL Right 12/15/2015   IR THROMBECTOMY AV FISTULA W/THROMBOLYSIS/PTA/STENT INC/SHUNT/IMG RT 12/15/2015 Arne Cleveland, MD [facility]  . IR GENERIC HISTORICAL  12/15/2015   IR US GUIDE VASC ACCESS RIGHT 12/15/2015 Arne Cleveland, MD [facility]  . IR GENERIC HISTORICAL  12/28/2015   IR FLUORO GUIDE CV LINE RIGHT 12/28/2015 Marybelle Killings, MD [facility]  . IR GENERIC HISTORICAL  12/28/2015   IR US GUIDE VASC ACCESS RIGHT 12/28/2015 Marybelle Killings, MD [facility]  . LEFT A ND RIGHT HEART CATH  01/30/2013   DR Sung Amabile  . LEFT AND  RIGHT HEART CATHETERIZATION WITH CORONARY ANGIOGRAM N/A 01/30/2013   Procedure: LEFT AND RIGHT HEART CATHETERIZATION WITH CORONARY ANGIOGRAM;  Surgeon: Jolaine Artist, MD;  Location: [facility] LAB;  Service: Cardiovascular;  Laterality: N/A;  . PERIPHERAL VASCULAR CATHETERIZATION Right 01/26/2015   Procedure: A/V Fistulagram;  Surgeon: Angelia Mould, MD;  Location: [place] [facility];  Service: Cardiovascular;  Laterality: Right;  . reapea urethral surgery for recurrent obstruction  2011  . TOTAL KNEE ARTHROPLASTY Right 2007  . VEIN HARVEST Right 03/31/2016   Procedure: RIGHT GREATER SAPHENOUS VEIN HARVEST;  Surgeon: Angelia Mould, MD;  Location: [facility];  Service: Vascular;  Laterality: Right;       Family History  Problem Relation Age of Onset  . Bladder Cancer Mother   . Alcohol abuse Father   . Melanoma Father   . Stroke Maternal Grandmother   . Heart Problems Maternal Grandmother        unknown  . Diabetes Maternal Grandmother   . Heart disease Maternal Grandfather   . Prostate cancer Maternal Grandfather     Social History   Tobacco Use  . Smoking status: Former Smoker    Packs/day: 2.00    Years: 32.00    Pack years: 64.00    Types: Cigarettes    Quit date: 05/11/2010    Years since quitting: 9.1  . Smokeless tobacco: Never Used  Vaping Use  . Vaping Use: Never used  Substance Use Topics  . Alcohol use: No  . Drug use: No    Home Medications Prior to Admission medications   Medication Sig Start Date End Date Taking? Authorizing Provider  albuterol (PROVENTIL HFA;VENTOLIN HFA) 108 (90 Base) MCG/ACT inhaler Inhale 2 puffs into the lungs every 4 (four) hours as needed for wheezing or shortness of breath. 02/16/18   Burchette, Alinda Sierras, MD  amoxicillin-clavulanate (AUGMENTIN) 875-125 MG tablet Take 1 tablet by mouth every 12 (twelve) hours. 07/01/19   Anthony Maize, PA-C  aspirin EC 81 MG tablet Take 81 mg by mouth daily.     [provider]   atorvastatin (LIPITOR) 80 MG tablet Take 1 tablet (80 mg total) by mouth at bedtime. 05/08/19   Burchette, Alinda Sierras, MD  azelastine (ASTELIN) 0.1 % nasal spray Place 2 sprays into both nostrils 3 (three) times daily as needed for rhinitis. Use in each nostril as directed 03/10/14   Burchette, Alinda Sierras, MD  carvedilol (COREG) 12.5 MG tablet Take 1 tablet (12.5 mg total) by mouth 2 (two) times daily with a meal. 05/08/19   Burchette, Alinda Sierras, MD  Continuous Blood Gluc Sensor (FREESTYLE LIBRE 14 DAY  SENSOR) MISC Inject 1 Device into the skin every 14 (fourteen) days. 07/31/18   Burchette, Alinda Sierras, MD  doxycycline (VIBRAMYCIN) 100 MG capsule Take 1 capsule (100 mg total) by mouth 2 (two) times daily for 7 days. 07/01/19 07/08/19  Anthony Maize, PA-C  fenofibrate 160 MG tablet Take 1 tablet (160 mg total) by mouth daily. 05/08/19   Burchette, Alinda Sierras, MD  fexofenadine (ALLEGRA) 180 MG tablet Take 180 mg by mouth daily.    [provider]  furosemide (LASIX) 40 MG tablet Take 3 tablets by mouth once daily 08/07/18   Burchette, Alinda Sierras, MD  gabapentin (NEURONTIN) 300 MG capsule Take 2 capsules (600 mg total) by mouth 2 (two) times daily. 05/08/19   Burchette, Alinda Sierras, MD  glucose blood test strip Check 1 time daily. E11.9 One Touch Ultra Blue Test Strips 03/10/14   [name], MD  Insulin Glargine [facility]) 100 UNIT/ML INJECT 70 UNITS SUBCUTANEOUSLY AT BEDTIME 05/08/19   Burchette, Alinda Sierras, MD  Insulin Lispro (HUMALOG KWIKPEN) 200 UNIT/ML SOPN Inject 10 Units into the skin 3 (three) times daily with meals. Use tid with meals as per sliding scale 03/16/18   Burchette, Alinda Sierras, MD  Insulin Pen Needle (BD PEN NEEDLE NANO U/F) 32G X 4 MM MISC USE 1 PEN NEEDLE SUBCUTANEOUSLY WITH INSULIN 4 TIMES DAILY 12/21/18   Burchette, Alinda Sierras, MD  lanthanum (FOSRENOL) 1000 MG chewable tablet Chew 1,000 mg by mouth 3 (three) times daily with meals. Take one tablet after each meal.     [provider]   multivitamin (RENA-VIT) TABS tablet Take 1 tablet by mouth daily. 03/21/19   Burchette, Alinda Sierras, MD  Olopatadine HCl (PATADAY) 0.2 % SOLN Place 1 drop into both eyes daily as needed (for allergies).     [provider]  sodium zirconium cyclosilicate (LOKELMA) 10 g PACK packet Take 10 g by mouth once for 1 dose. 07/01/19 07/01/19  Anthony Maize, PA-C    Allergies    Morphine sulfate  Review of Systems   Review of Systems  Constitutional: Negative for chills and fever.  Respiratory: Positive for cough and shortness of breath. Negative for wheezing.   Cardiovascular: Negative for chest pain and leg swelling.  Gastrointestinal: Negative for abdominal distention, abdominal pain, nausea and vomiting.  All other systems reviewed and are negative.   Physical Exam Updated Vital Signs BP (!) 185/74   Pulse 92   Temp 97.8 F (36.6 C) (Oral)   Resp 16   Ht _0  (1.778 m)   Wt 100.7 kg   SpO2 100%   BMI 31.85 kg/m   Physical Exam Vitals and nursing note reviewed.  Constitutional:      Appearance: He is obese. He is not ill-appearing or diaphoretic.  HENT:     Head: Normocephalic and atraumatic.  Eyes:     Conjunctiva/sclera: Conjunctivae normal.  Cardiovascular:     Rate and Rhythm: Normal rate and regular rhythm.     Pulses: Normal pulses.  Pulmonary:     Effort: Pulmonary effort is normal.     Breath sounds: Normal breath sounds. No rales.     Comments: Faint end expiratory wheezes bilaterally. Pt currently on 2L [place] with O2 sats 100%. He is able to speak in full sentences without difficulty. No accessory muscle use.  Chest:     Chest wall: No tenderness.  Abdominal:     Palpations: Abdomen is soft.     Tenderness: There is no  abdominal tenderness. There is no guarding or rebound.  Musculoskeletal:     Cervical back: Neck supple.     Comments: R BKA with prosthesis in place. LLE without edema.   Skin:    General: Skin is warm and dry.  Neurological:     Mental  Status: He is alert.     ED Results / Procedures / Treatments   Labs (all labs ordered are listed, but only abnormal results are displayed) Labs Reviewed  COMPREHENSIVE METABOLIC PANEL - Abnormal; Notable for the following components:      Result Value   Sodium 132 (*)    Potassium 5.9 (*)    Chloride 92 (*)    Glucose, Bld 331 (*)    BUN 65 (*)    Creatinine, Ser 11.02 (*)    Calcium 8.8 (*)    GFR calc non Af Amer 5 (*)    GFR calc Af Amer 5 (*)    All other components within normal limits  CBC WITH DIFFERENTIAL/PLATELET - Abnormal; Notable for the following components:   RBC 3.75 (*)    Hemoglobin 10.7 (*)    HCT 34.0 (*)    Platelets 142 (*)    All other components within normal limits  BRAIN NATRIURETIC PEPTIDE - Abnormal; Notable for the following components:   B Natriuretic Peptide 441.0 (*)    All other components within normal limits  CBG MONITORING, ED - Abnormal; Notable for the following components:   Glucose-Capillary 302 (*)    All other components within normal limits  CBG MONITORING, ED - Abnormal; Notable for the following components:   Glucose-Capillary 268 (*)    All other components within normal limits  TROPONIN I (HIGH SENSITIVITY) - Abnormal; Notable for the following components:   Troponin I (High Sensitivity) 53 (*)    All other components within normal limits  TROPONIN I (HIGH SENSITIVITY) - Abnormal; Notable for the following components:   Troponin I (High Sensitivity) 63 (*)    All other components within normal limits    EKG EKG Interpretation  Date/Time:  Monday July 01 2019 08:24:30 EDT Ventricular Rate:  96 PR Interval:    QRS Duration: 106 QT Interval:  380 QTC Calculation: 481 R Axis:   -41 Text Interpretation: Sinus rhythm Left anterior fascicular block Abnormal R-wave progression, late transition Nonspecific T abnormalities, lateral leads Borderline prolonged QT interval No STEMI Confirmed by Nanda Quinton [phone]) on 07/01/2019  9:56:11 AM   Radiology DG Chest 2 View  Result Date: 07/01/2019 CLINICAL DATA:  Short of breath EXAM: CHEST - 2 VIEW COMPARISON:  11/30/2015 FINDINGS: Right jugular central venous catheter tip with the tip at the cavoatrial junction. Heart size upper normal. Negative for heart failure or edema. Mild patchy bibasilar airspace disease. External cardiac defibrillator in the anterior soft tissues is unchanged from the prior study. IMPRESSION: Mild patchy bibasilar airspace disease likely atelectasis versus pneumonia. Negative for heart failure Electronically Signed   By: Franchot Gallo M.D.   On: 07/01/2019 10:53    Procedures Procedures (including critical care time)  Medications Ordered in ED Medications  albuterol (VENTOLIN HFA) 108 (90 Base) MCG/ACT inhaler 2 puff (2 puffs Inhalation Given 07/01/19 1157)  predniSONE (DELTASONE) tablet 60 mg (60 mg Oral Given 07/01/19 1052)  sodium zirconium cyclosilicate (LOKELMA) packet 10 g (10 g Oral Given 07/01/19 1226)  insulin aspart (novoLOG) injection 8 Units (8 Units Subcutaneous Given 07/01/19 1225)  carvedilol (COREG) tablet 12.5 mg (12.5 mg Oral Given 07/01/19  1413)    ED Course  I have reviewed the triage vital signs and the nursing notes.  Pertinent labs & imaging results that were available during my care of the patient were reviewed by me and considered in my medical decision making (see chart for details).  Clinical Course as of Jul 01 1519  Mon Jul 01, 2019  1117 baseline  Creatinine(!): 11.02 [MV]  1117 Potassium(!): 5.9 [MV]  1117 Glucose(!): 331 [MV]  1117 Likely s/2 ESRD  Troponin I (High Sensitivity)(!): 53 [MV]  1126 Discussed case with nephrologist Dr. Posey Pronto - agrees with [facility] and insulin in the ED. Recommends discharging with an additional 10 g Lokelma to take tonight and to have dialysis as scheduled tomorrow in outpatient setting   [MV]  1406 Glucose-Capillary(!): 268 [MV]    Clinical Course User Index [MV] Anthony Maize, PA-C   MDM Rules/Calculators/A&P                          59 year old male who presents to the ED via EMS for shortness of breath that began last night with productive cough.  Found to be hypoxic at 89% on room air with EMS, placed on 2 L nasal cannula with improvement to 100%.  On arrival to the ED patient is afebrile, nontachycardic and nontachypneic.  He appears to be in no acute distress.  States that shortness of breath has improved with 2 L O2.  Is very faint mild end expiratory wheezes bilaterally on exam.  He does have a history of COPD, will treat as COPD exacerbation at this time and provide additional albuterol and prednisone tablet.  Will obtain chest x-ray and the work-up with labs.  Patient does have a history of CHF and will rule this out with BNP today.  Is a dialysis patient has not missed any ointments, I doubt fluid overload.   EKG with nonspec t wave abnormalities, last EKG 2015.  CXR with findings consistent with atelectasis vs pneumonia. No signs of vascular congestion.  CBC without leukocytosis. Hgb stable.  CMP with stable creatinine at 11.02 however potassium 5.9. Glucose also elevated at 331, bicarb 26. No gap. No consistent with DKA. Will provide lokelma and insulin and discuss with nephrology.   Troponin 53, suspect s/2 renal disease. Will repeat. Pt without chest pain.  BNP stable at 441.   Pt weaned off of O2. O2 sats remained above 95% on RA.   Dr. Posey Pronto with nephrology with recommendations as seen in ED course - does not require emergent dialysis today.  Repeat troponin of 63.   Pt has been ambulated in the ED and O2 sats remained above 98%. Do not feel pt requires admission at this time. Repeat CBG 268. Pt to be discharged home at this time with additional lokelma per nephrology recommendations. Will treat for possible PNA given cough and SOB; pt instructed to follow up with PCP in 1-2 weeks for recheck of CXR. Strict return precautions have been  discussed with pt. He is in agreement with plan and stable for discharge home.   This note was prepared using Dragon voice recognition software and may include unintentional dictation errors due to the inherent limitations of voice recognition software.  Final Clinical Impression(s) / ED Diagnoses Final diagnoses:  Shortness of breath  Community acquired pneumonia, unspecified laterality  Hyperkalemia  ESRD on dialysis [facility])    Rx / DC Orders ED Discharge Orders  Ordered    sodium zirconium cyclosilicate (LOKELMA) 10 g PACK packet   Once     Discontinue  Reprint     07/01/19 1326    doxycycline (VIBRAMYCIN) 100 MG capsule  2 times daily     Discontinue  Reprint     07/01/19 1326    amoxicillin-clavulanate (AUGMENTIN) 875-125 MG tablet  Every 12 hours     Discontinue  Reprint     07/01/19 1326           Discharge Instructions     Your potassium was elevated today - we have given you medication in the ED to help reduce it. I have also prescribed an additional packet of Lokelma for you to take tonight to reduce the potassium even further.   Your chest xray did show signs of possible pneumonia - given your cough we will treat you for this today. Please pick up antibiotics and take as prescribed. Follow up with your PCP in 1-2 weeks for repeat chest xray  Go to dialysis as scheduled tomorrow and continue taking the rest of your medications as prescribed including your insulin and high blood pressure medication.   Return to the ED IMMEDIATELY for any worsening symptoms including worsening shortness of breath, excessive cough, fevers > 100.4, or any other new/concerning symptoms       Anthony Maize, PA-C 07/01/19 1521    Long, Wonda Old[place], MD 07/02/19 1228

## 2019-07-01 NOTE — Discharge Instructions (Addendum)
Your potassium was elevated today - we have given you medication in the ED to help reduce it. I have also prescribed an additional packet of Lokelma for you to take tonight to reduce the potassium even further.   Your chest xray did show signs of possible pneumonia - given your cough we will treat you for this today. Please pick up antibiotics and take as prescribed. Follow up with your PCP in 1-2 weeks for repeat chest xray  Go to dialysis as scheduled tomorrow and continue taking the rest of your medications as prescribed including your insulin and high blood pressure medication.   Return to the ED IMMEDIATELY for any worsening symptoms including worsening shortness of breath, excessive cough, fevers > 100.4, or any other new/concerning symptoms

## 2019-07-01 NOTE — ED Notes (Addendum)
O2 stayed at 98 while we walked the back of nurses station, pt stated he did not feel dizzy or lightheaded.

## 2019-07-02 DIAGNOSIS — D631 Anemia in chronic kidney disease: Secondary | ICD-10-CM | POA: Diagnosis not present

## 2019-07-02 DIAGNOSIS — N186 End stage renal disease: Secondary | ICD-10-CM | POA: Diagnosis not present

## 2019-07-02 DIAGNOSIS — N2581 Secondary hyperparathyroidism of renal origin: Secondary | ICD-10-CM | POA: Diagnosis not present

## 2019-07-02 DIAGNOSIS — Z992 Dependence on renal dialysis: Secondary | ICD-10-CM | POA: Diagnosis not present

## 2019-07-04 DIAGNOSIS — D631 Anemia in chronic kidney disease: Secondary | ICD-10-CM | POA: Diagnosis not present

## 2019-07-04 DIAGNOSIS — Z992 Dependence on renal dialysis: Secondary | ICD-10-CM | POA: Diagnosis not present

## 2019-07-04 DIAGNOSIS — N186 End stage renal disease: Secondary | ICD-10-CM | POA: Diagnosis not present

## 2019-07-04 DIAGNOSIS — N2581 Secondary hyperparathyroidism of renal origin: Secondary | ICD-10-CM | POA: Diagnosis not present

## 2019-07-06 DIAGNOSIS — D631 Anemia in chronic kidney disease: Secondary | ICD-10-CM | POA: Diagnosis not present

## 2019-07-06 DIAGNOSIS — N186 End stage renal disease: Secondary | ICD-10-CM | POA: Diagnosis not present

## 2019-07-06 DIAGNOSIS — Z992 Dependence on renal dialysis: Secondary | ICD-10-CM | POA: Diagnosis not present

## 2019-07-06 DIAGNOSIS — N2581 Secondary hyperparathyroidism of renal origin: Secondary | ICD-10-CM | POA: Diagnosis not present

## 2019-07-09 DIAGNOSIS — Z87891 Personal history of nicotine dependence: Secondary | ICD-10-CM | POA: Diagnosis not present

## 2019-07-09 DIAGNOSIS — J810 Acute pulmonary edema: Secondary | ICD-10-CM | POA: Diagnosis not present

## 2019-07-09 DIAGNOSIS — J9601 Acute respiratory failure with hypoxia: Secondary | ICD-10-CM | POA: Diagnosis not present

## 2019-07-09 DIAGNOSIS — Z20822 Contact with and (suspected) exposure to covid-19: Secondary | ICD-10-CM | POA: Diagnosis not present

## 2019-07-10 ENCOUNTER — Inpatient Hospital Stay (HOSPITAL_COMMUNITY)
Admission: EM | Admit: 2019-07-10 | Discharge: 2019-07-11 | DRG: 189 | Disposition: A | Discharge status: Home / Self Care | Payer: Medicare Other | Attending: Internal Medicine | Admitting: Internal Medicine

## 2019-07-10 ENCOUNTER — Encounter (HOSPITAL_COMMUNITY): Payer: Self-pay | Admitting: Family Medicine

## 2019-07-10 ENCOUNTER — Other Ambulatory Visit: Payer: Self-pay

## 2019-07-10 DIAGNOSIS — Z8052 Family history of malignant neoplasm of bladder: Secondary | ICD-10-CM

## 2019-07-10 DIAGNOSIS — I2510 Atherosclerotic heart disease of native coronary artery without angina pectoris: Secondary | ICD-10-CM | POA: Diagnosis present

## 2019-07-10 DIAGNOSIS — Y95 Nosocomial condition: Secondary | ICD-10-CM | POA: Diagnosis present

## 2019-07-10 DIAGNOSIS — D631 Anemia in chronic kidney disease: Secondary | ICD-10-CM | POA: Diagnosis present

## 2019-07-10 DIAGNOSIS — Z8042 Family history of malignant neoplasm of prostate: Secondary | ICD-10-CM | POA: Diagnosis not present

## 2019-07-10 DIAGNOSIS — Z96651 Presence of right artificial knee joint: Secondary | ICD-10-CM | POA: Diagnosis present

## 2019-07-10 DIAGNOSIS — Z87891 Personal history of nicotine dependence: Secondary | ICD-10-CM

## 2019-07-10 DIAGNOSIS — Z992 Dependence on renal dialysis: Secondary | ICD-10-CM

## 2019-07-10 DIAGNOSIS — Z808 Family history of malignant neoplasm of other organs or systems: Secondary | ICD-10-CM

## 2019-07-10 DIAGNOSIS — J440 Chronic obstructive pulmonary disease with acute lower respiratory infection: Secondary | ICD-10-CM | POA: Diagnosis present

## 2019-07-10 DIAGNOSIS — Z833 Family history of diabetes mellitus: Secondary | ICD-10-CM | POA: Diagnosis not present

## 2019-07-10 DIAGNOSIS — E785 Hyperlipidemia, unspecified: Secondary | ICD-10-CM | POA: Diagnosis present

## 2019-07-10 DIAGNOSIS — Z87442 Personal history of urinary calculi: Secondary | ICD-10-CM

## 2019-07-10 DIAGNOSIS — Z811 Family history of alcohol abuse and dependence: Secondary | ICD-10-CM

## 2019-07-10 DIAGNOSIS — Z89511 Acquired absence of right leg below knee: Secondary | ICD-10-CM

## 2019-07-10 DIAGNOSIS — I169 Hypertensive crisis, unspecified: Secondary | ICD-10-CM | POA: Diagnosis present

## 2019-07-10 DIAGNOSIS — E1122 Type 2 diabetes mellitus with diabetic chronic kidney disease: Secondary | ICD-10-CM | POA: Diagnosis present

## 2019-07-10 DIAGNOSIS — Z794 Long term (current) use of insulin: Secondary | ICD-10-CM

## 2019-07-10 DIAGNOSIS — I5022 Chronic systolic (congestive) heart failure: Secondary | ICD-10-CM | POA: Diagnosis present

## 2019-07-10 DIAGNOSIS — N186 End stage renal disease: Secondary | ICD-10-CM

## 2019-07-10 DIAGNOSIS — J449 Chronic obstructive pulmonary disease, unspecified: Secondary | ICD-10-CM | POA: Diagnosis not present

## 2019-07-10 DIAGNOSIS — Z95810 Presence of automatic (implantable) cardiac defibrillator: Secondary | ICD-10-CM

## 2019-07-10 DIAGNOSIS — Z823 Family history of stroke: Secondary | ICD-10-CM

## 2019-07-10 DIAGNOSIS — E1129 Type 2 diabetes mellitus with other diabetic kidney complication: Secondary | ICD-10-CM

## 2019-07-10 DIAGNOSIS — I428 Other cardiomyopathies: Secondary | ICD-10-CM | POA: Diagnosis present

## 2019-07-10 DIAGNOSIS — L89312 Pressure ulcer of right buttock, stage 2: Secondary | ICD-10-CM | POA: Diagnosis present

## 2019-07-10 DIAGNOSIS — L89322 Pressure ulcer of left buttock, stage 2: Secondary | ICD-10-CM | POA: Diagnosis present

## 2019-07-10 DIAGNOSIS — Z79899 Other long term (current) drug therapy: Secondary | ICD-10-CM

## 2019-07-10 DIAGNOSIS — I132 Hypertensive heart and chronic kidney disease with heart failure and with stage 5 chronic kidney disease, or end stage renal disease: Secondary | ICD-10-CM | POA: Diagnosis present

## 2019-07-10 DIAGNOSIS — E1140 Type 2 diabetes mellitus with diabetic neuropathy, unspecified: Secondary | ICD-10-CM | POA: Diagnosis present

## 2019-07-10 DIAGNOSIS — E1165 Type 2 diabetes mellitus with hyperglycemia: Secondary | ICD-10-CM | POA: Diagnosis present

## 2019-07-10 DIAGNOSIS — J189 Pneumonia, unspecified organism: Secondary | ICD-10-CM | POA: Diagnosis present

## 2019-07-10 DIAGNOSIS — Z7982 Long term (current) use of aspirin: Secondary | ICD-10-CM

## 2019-07-10 DIAGNOSIS — Z20822 Contact with and (suspected) exposure to covid-19: Secondary | ICD-10-CM | POA: Diagnosis present

## 2019-07-10 DIAGNOSIS — E8889 Other specified metabolic disorders: Secondary | ICD-10-CM | POA: Diagnosis present

## 2019-07-10 DIAGNOSIS — J9601 Acute respiratory failure with hypoxia: Secondary | ICD-10-CM | POA: Diagnosis present

## 2019-07-10 DIAGNOSIS — E1169 Type 2 diabetes mellitus with other specified complication: Secondary | ICD-10-CM | POA: Diagnosis present

## 2019-07-10 DIAGNOSIS — Z8249 Family history of ischemic heart disease and other diseases of the circulatory system: Secondary | ICD-10-CM

## 2019-07-10 DIAGNOSIS — I161 Hypertensive emergency: Secondary | ICD-10-CM | POA: Diagnosis present

## 2019-07-10 LAB — CBC WITH DIFFERENTIAL/PLATELET
Abs Immature Granulocytes: 0.08 10*3/uL — ABNORMAL HIGH (ref 0.00–0.07)
Basophils Absolute: 0 10*3/uL (ref 0.0–0.1)
Basophils Relative: 0 %
Eosinophils Absolute: 0 10*3/uL (ref 0.0–0.5)
Eosinophils Relative: 0 %
HCT: 31.7 % — ABNORMAL LOW (ref 39.0–52.0)
Hemoglobin: 10.3 g/dL — ABNORMAL LOW (ref 13.0–17.0)
Immature Granulocytes: 1 %
Lymphocytes Relative: 9 %
Lymphs Abs: 1.3 10*3/uL (ref 0.7–4.0)
MCH: 28.9 pg (ref 26.0–34.0)
MCHC: 32.5 g/dL (ref 30.0–36.0)
MCV: 88.8 fL (ref 80.0–100.0)
Monocytes Absolute: 0.9 10*3/uL (ref 0.1–1.0)
Monocytes Relative: 7 %
Neutro Abs: 11.6 10*3/uL — ABNORMAL HIGH (ref 1.7–7.7)
Neutrophils Relative %: 83 %
Platelets: 148 10*3/uL — ABNORMAL LOW (ref 150–400)
RBC: 3.57 MIL/uL — ABNORMAL LOW (ref 4.22–5.81)
RDW: 14.5 % (ref 11.5–15.5)
WBC: 13.9 10*3/uL — ABNORMAL HIGH (ref 4.0–10.5)
nRBC: 0 % (ref 0.0–0.2)

## 2019-07-10 LAB — BASIC METABOLIC PANEL
Anion gap: 16 — ABNORMAL HIGH (ref 5–15)
BUN: 102 mg/dL — ABNORMAL HIGH (ref 6–20)
CO2: 21 mmol/L — ABNORMAL LOW (ref 22–32)
Calcium: 8.4 mg/dL — ABNORMAL LOW (ref 8.9–10.3)
Chloride: 94 mmol/L — ABNORMAL LOW (ref 98–111)
Creatinine, Ser: 15.24 mg/dL — ABNORMAL HIGH (ref 0.61–1.24)
GFR calc Af Amer: 4 mL/min — ABNORMAL LOW (ref >60)
GFR calc non Af Amer: 3 mL/min — ABNORMAL LOW (ref >60)
Glucose, Bld: 135 mg/dL — ABNORMAL HIGH (ref 70–99)
Potassium: 5.1 mmol/L (ref 3.5–5.1)
Sodium: 131 mmol/L — ABNORMAL LOW (ref 135–145)

## 2019-07-10 LAB — GLUCOSE, CAPILLARY
Glucose-Capillary: 113 mg/dL — ABNORMAL HIGH (ref 70–99)
Glucose-Capillary: 136 mg/dL — ABNORMAL HIGH (ref 70–99)
Glucose-Capillary: 115 mg/dL — ABNORMAL HIGH (ref 70–99)
Glucose-Capillary: 120 mg/dL — ABNORMAL HIGH (ref 70–99)

## 2019-07-10 LAB — HEPATITIS B CORE ANTIBODY, TOTAL: Hep B Core Total Ab: NONREACTIVE

## 2019-07-10 LAB — HIV ANTIBODY (ROUTINE TESTING W REFLEX): HIV Screen 4th Generation wRfx: NONREACTIVE

## 2019-07-10 LAB — HEPATITIS B SURFACE ANTIGEN: Hepatitis B Surface Ag: NONREACTIVE

## 2019-07-10 LAB — MRSA PCR SCREENING: MRSA by PCR: NEGATIVE

## 2019-07-10 LAB — HEPATITIS B SURFACE ANTIBODY,QUALITATIVE: Hep B S Ab: NONREACTIVE

## 2019-07-10 LAB — SARS CORONAVIRUS 2 BY RT PCR (HOSPITAL ORDER, PERFORMED IN ~~LOC~~ HOSPITAL LAB): SARS Coronavirus 2: NEGATIVE

## 2019-07-10 LAB — PROCALCITONIN: Procalcitonin: 1.6 ng/mL

## 2019-07-10 MED ORDER — CARVEDILOL 12.5 MG PO TABS
12.5000 mg | ORAL_TABLET | Freq: Two times a day (BID) | ORAL | Status: DC
  Administered 2019-07-10: 12.5 mg via ORAL
  Filled 2019-07-10 (×3): qty 1

## 2019-07-10 MED ORDER — SODIUM CHLORIDE 0.9 % IV SOLN
500.0000 mg | INTRAVENOUS | Status: DC
  Administered 2019-07-10: 500 mg via INTRAVENOUS
  Filled 2019-07-10 (×2): qty 500

## 2019-07-10 MED ORDER — SODIUM CHLORIDE 0.9% FLUSH: 3.0000 mL | INTRAVENOUS | Status: DC | PRN

## 2019-07-10 MED ORDER — SODIUM CHLORIDE 0.9 % IV SOLN
2.0000 g | INTRAVENOUS | Status: DC
  Administered 2019-07-10 – 2019-07-11 (×2): 2 g via INTRAVENOUS
  Filled 2019-07-10: qty 2
  Filled 2019-07-10: qty 20
  Filled 2019-07-10: qty 2

## 2019-07-10 MED ORDER — FUROSEMIDE 10 MG/ML IJ SOLN
80.0000 mg | Freq: Two times a day (BID) | INTRAMUSCULAR | Status: DC
  Administered 2019-07-10: 80 mg via INTRAVENOUS
  Filled 2019-07-10: qty 8

## 2019-07-10 MED ORDER — LANTHANUM CARBONATE 500 MG PO CHEW
1000.0000 mg | CHEWABLE_TABLET | Freq: Three times a day (TID) | ORAL | Status: DC
  Administered 2019-07-10 – 2019-07-11 (×3): 1000 mg via ORAL
  Filled 2019-07-10 (×6): qty 2

## 2019-07-10 MED ORDER — ATORVASTATIN CALCIUM 80 MG PO TABS
80.0000 mg | ORAL_TABLET | Freq: Every day | ORAL | Status: DC
  Administered 2019-07-10: 80 mg via ORAL
  Filled 2019-07-10: qty 1

## 2019-07-10 MED ORDER — HEPARIN SODIUM (PORCINE) 1000 UNIT/ML IJ SOLN
INTRAMUSCULAR | Status: AC
  Administered 2019-07-10: 3800 [IU]
  Filled 2019-07-10: qty 4

## 2019-07-10 MED ORDER — ONDANSETRON HCL 4 MG/2ML IJ SOLN
4.0000 mg | Freq: Four times a day (QID) | INTRAMUSCULAR | Status: DC | PRN
  Filled 2019-07-10: qty 2

## 2019-07-10 MED ORDER — HEPARIN SODIUM (PORCINE) 5000 UNIT/ML IJ SOLN
5000.0000 [IU] | Freq: Three times a day (TID) | INTRAMUSCULAR | Status: DC
  Administered 2019-07-10 (×2): 5000 [IU] via SUBCUTANEOUS
  Filled 2019-07-10 (×3): qty 1

## 2019-07-10 MED ORDER — ASPIRIN EC 81 MG PO TBEC
81.0000 mg | DELAYED_RELEASE_TABLET | Freq: Every day | ORAL | Status: DC
  Administered 2019-07-10 – 2019-07-11 (×2): 81 mg via ORAL
  Filled 2019-07-10 (×2): qty 1

## 2019-07-10 MED ORDER — ACETAMINOPHEN 325 MG PO TABS
650.0000 mg | ORAL_TABLET | ORAL | Status: DC | PRN
  Administered 2019-07-10: 650 mg via ORAL
  Filled 2019-07-10: qty 2

## 2019-07-10 MED ORDER — ORAL CARE MOUTH RINSE
15.0000 mL | Freq: Two times a day (BID) | OROMUCOSAL | Status: DC
  Administered 2019-07-10 – 2019-07-11 (×3): 15 mL via OROMUCOSAL

## 2019-07-10 MED ORDER — CHLORHEXIDINE GLUCONATE CLOTH 2 % EX PADS
6.0000 | MEDICATED_PAD | Freq: Every day | CUTANEOUS | Status: DC
  Administered 2019-07-11 (×2): 6 via TOPICAL

## 2019-07-10 MED ORDER — INSULIN GLARGINE 100 UNIT/ML ~~LOC~~ SOLN
45.0000 [IU] | Freq: Every day | SUBCUTANEOUS | Status: DC
  Filled 2019-07-10: qty 0.45

## 2019-07-10 MED ORDER — DOXERCALCIFEROL 2.5 MCG PO CAPS
2.5000 ug | ORAL_CAPSULE | ORAL | Status: DC
  Filled 2019-07-10: qty 1

## 2019-07-10 MED ORDER — INSULIN GLARGINE 100 UNIT/ML ~~LOC~~ SOLN
45.0000 [IU] | Freq: Every day | SUBCUTANEOUS | Status: DC
  Filled 2019-07-10 (×2): qty 0.45

## 2019-07-10 MED ORDER — SODIUM CHLORIDE 0.9% FLUSH
3.0000 mL | Freq: Two times a day (BID) | INTRAVENOUS | Status: DC
  Administered 2019-07-10 – 2019-07-11 (×3): 3 mL via INTRAVENOUS

## 2019-07-10 MED ORDER — SODIUM CHLORIDE 0.9 % IV SOLN
250.0000 mL | INTRAVENOUS | Status: DC | PRN
  Administered 2019-07-11: 250 mL via INTRAVENOUS

## 2019-07-10 MED ORDER — INSULIN ASPART 100 UNIT/ML ~~LOC~~ SOLN
2.0000 [IU] | Freq: Three times a day (TID) | SUBCUTANEOUS | Status: DC
  Administered 2019-07-11: 2 [IU] via SUBCUTANEOUS

## 2019-07-10 MED ORDER — CHLORHEXIDINE GLUCONATE CLOTH 2 % EX PADS: 6.0000 | MEDICATED_PAD | Freq: Every day | CUTANEOUS | Status: DC

## 2019-07-10 MED ORDER — INSULIN ASPART 100 UNIT/ML ~~LOC~~ SOLN: 0.0000 [IU] | Freq: Three times a day (TID) | SUBCUTANEOUS | Status: DC

## 2019-07-10 NOTE — Progress Notes (Signed)
RT called to dialysis due to respiratory distress. Pt was on a NRB sating 88% and increased WOB. Pt placed on BiPAP machine. Pt states he feels much better on the machine and can breath easier. Pt saturations came up to 99% and WOB decreased some. RT will continue to monitor.

## 2019-07-10 NOTE — Plan of Care (Signed)

## 2019-07-10 NOTE — Progress Notes (Signed)
Pt was taken off BiPAP in dialysis and placed on 6L [place]. Pt tolerating well. RT went with RN and transport to 3E24 to set up BiPAP and get pt settled in his room. RT will continue to monitor.

## 2019-07-10 NOTE — Progress Notes (Signed)
Patient had 6 beat run of vtach. Patient stable and provider notified.

## 2019-07-10 NOTE — H&P (Signed)
History and Physical    Anthony Bullock HCW:[id number] DOB: Jun 25, 1960 DOA: 07/10/2019  PCP: [name], MD   Patient coming from: Home   Chief Complaint: SOB   HPI: Anthony Bullock is a 59 y.o. male with medical history significant for coronary artery disease, cardiomyopathy, COPD, insulin-dependent diabetes mellitus, and ESRD on hemodialysis, now presenting to the emergency department for evaluation of shortness of breath. The patient reports that he developed a productive cough and dyspnea approximately 1 week prior to this, was started on antibiotics at that time and started to feel better before acutely worsening the night of 07/08/2019. He denies any associated fevers or chills, denies any chest pain, reports chronic stable leg swelling, and reports that he was last dialyzed on 07/06/2019. With his acute worsening in shortness of breath and feeling as though he cannot catch his breath, he called EMS and was found to be saturating in the 70s on room air with blood pressure of 200/100s. Patient was treated with nitroglycerin and CPAP en route to the emergency department.  [facility] Course: Upon arrival to the ED, patient is found to be saturating 91% on BiPAP, respiratory rate in the low 20s, blood pressure 177/82. EKG features sinus rhythm with LVH. Chest x-ray concerning for progressive patchy airspace disease in the bilateral mid and lower lungs. Chemistry panel with sodium one hundred thirty-two, normal potassium and bicarb, and BUN of seventy-four. CBC features a leukocytosis to fifteen thousand. Lactic acid was normal. proBNP elevated. Troponin was normal. Patient was placed on BiPAP in the ED, blood cultures were collected, and he was treated with Lasix and Zosyn. Arrangements were made for transfer to [facility] for ongoing evaluation and management.  Review of Systems:  All other systems reviewed and apart from HPI, are negative.  Past Medical History:  Diagnosis  Date  . 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 Allergic rhinitis   . Anemia   . Arthritis   . Chronic systolic heart failure ([place])    a. ECHO (12/2012) EF 25-30%, HK entireanteroseptal myocardium //  b.  EF 25%, diffuse HK, grade 1 diastolic dysfunction, MAC, mild LAE, normal RVSF, trivial pericardial effusion  . COPD (chronic obstructive pulmonary disease) ([place])   . Diabetes mellitus type II   . Diabetic nephropathy ([place])   . Diabetic neuropathy ([place])   . ESRD on hemodialysis [facility])    started HD June 2017, goes to [facility] HD unit, Dr Hinda Lenis  . History of cardiac catheterization    a.Myoview 1/15:  There is significant left ventricular dysfunction. There may be slight scar at the apex. There is no significant ischemia. LV Ejection Fraction: 27%  //  b. RHC/LHC (1/15) with mean RA 6, PA 47/22 mean 33, mean PCWP 20, PVR 2.5 WU, CI 2.5; 80% dLAD stenosis, 70% diffuse large D.   . History of kidney stones   . Hyperlipidemia   . Hypertension   . Kidney stones   . NICM (nonischemic cardiomyopathy) ([place])    Primarily nonischemic. Echo (12/14) with EF 25-30%. Echo (3/15) with EF 25%, mild to moderately dilated LV, normal RV size and systolic function.   . Pneumonia   . Urethral stricture     Past Surgical History:  Procedure Laterality Date  . ABDOMINAL AORTOGRAM W/LOWER EXTREMITY N/A 03/30/2016   Procedure: Abdominal Aortogram w/Lower Extremity;  Surgeon: Angelia Mould, MD;  Location: [place] [facility];  Service: Cardiovascular;  Laterality: N/A;  .  AMPUTATION Right 04/26/2016   Procedure: Right Below Knee Amputation;  Surgeon: Newt Minion, MD;  Location: [place];  Service: Orthopedics;  Laterality: Right;  . AV FISTULA PLACEMENT Right 09/08/2015   Procedure: INSERTION OF 4-51m x 45cm  ARTERIOVENOUS (AV) GORE-TEX GRAFT RIGHT UPPER  ARM;  Surgeon: CAngelia Mould MD;  [place]  Service: Vascular;  Laterality: Right;  . AV  FISTULA PLACEMENT Left 01/14/2016   Procedure: CREATION OF LEFT UPPER ARM ARTERIOVENOUS FISTULA;  Surgeon: CAngelia Mould MD;  [place]  Service: Vascular;  Laterality: Left;  . BASCILIC VEIN TRANSPOSITION Right 08/22/2014   Procedure: RIGHT UPPER ARM BASCILIC VEIN TRANSPOSITION;  Surgeon: CAngelia Mould MD;  [place]  Service: Vascular;  Laterality: Right;  . BELOW KNEE LEG AMPUTATION Right 04/26/2016  . CARDIAC CATHETERIZATION    . CARDIAC DEFIBRILLATOR PLACEMENT  06/27/2013   Sub Q       BY DR KCaryl Comes . CATARACT EXTRACTION W/PHACO Right 08/06/2018   Procedure: CATARACT EXTRACTION PHACO AND INTRAOCULAR LENS PLACEMENT (IOC);  Surgeon: WBaruch Goldmann MD;  Location: [facility];  Service: Ophthalmology;  Laterality: Right;  CDE: 4.06  . CATARACT EXTRACTION W/PHACO Left 08/20/2018   Procedure: CATARACT EXTRACTION PHACO AND INTRAOCULAR LENS PLACEMENT (IOC);  Surgeon: WBaruch Goldmann MD;  Location: [facility];  Service: Ophthalmology;  Laterality: Left;  CDE: 6.76  . COLONOSCOPY WITH PROPOFOL N/A 07/22/2015   Procedure: COLONOSCOPY WITH PROPOFOL;  Surgeon: HDoran Stabler MD;  [facility]: [facility];  Service: Gastroenterology;  Laterality: N/A;  . FEMORAL-POPLITEAL BYPASS GRAFT Right 03/31/2016   Procedure: BYPASS GRAFT FEMORAL-POPLITEAL ARTERY USING RIGHT GREATER SAPHENOUS NONREVERSED VEIN;  Surgeon: CAngelia Mould MD;  [place]  Service: Vascular;  Laterality: Right;  . HERNIA REPAIR    . I & D EXTREMITY Right 03/31/2016   Procedure: IRRIGATION AND DEBRIDEMENT FOOT;  Surgeon: CAngelia Mould MD;  [place]  Service: Vascular;  Laterality: Right;  . IMPLANTABLE CARDIOVERTER DEFIBRILLATOR IMPLANT N/A 06/27/2013   Procedure: SUB Q ICD;  Surgeon: SDeboraha Sprang MD;  [facility];  Service: Cardiovascular;  Laterality: N/A;  . INTRAOPERATIVE ARTERIOGRAM Right 03/31/2016   Procedure: INTRA OPERATIVE ARTERIOGRAM;  Surgeon: CAngelia Mould  MD;  [place]  Service: Vascular;  Laterality: Right;  . IR GENERIC HISTORICAL Right 11/30/2015   IR THROMBECTOMY AV FISTULA W/THROMBOLYSIS/PTA INC/SHUNT/IMG RIGHT 11/30/2015 GAletta Edouard MD [facility]  . IR GENERIC HISTORICAL  11/30/2015   IR [place] ACCESS RIGHT 11/30/2015 GAletta Edouard MD [facility]  . IR GENERIC HISTORICAL Right 12/15/2015   IR THROMBECTOMY AV FISTULA W/THROMBOLYSIS/PTA/STENT INC/SHUNT/IMG RT 12/15/2015 DArne Cleveland MD [facility]  . IR GENERIC HISTORICAL  12/15/2015   IR [place] VASC ACCESS RIGHT 12/15/2015 DArne Cleveland MD [facility]  . IR GENERIC HISTORICAL  12/28/2015   IR FLUORO GUIDE CV LINE RIGHT 12/28/2015 AMarybelle Killings MD [facility]  . IR GENERIC HISTORICAL  12/28/2015   IR [place] VASC ACCESS RIGHT 12/28/2015 AMarybelle Killings MD [facility]  . LEFT A ND RIGHT HEART CATH  01/30/2013   DR BSung Amabile . LEFT AND RIGHT HEART CATHETERIZATION WITH CORONARY ANGIOGRAM N/A 01/30/2013   Procedure: LEFT AND RIGHT HEART CATHETERIZATION WITH CORONARY ANGIOGRAM;  Surgeon: DJolaine Artist MD;  [facility];  Service: Cardiovascular;  Laterality: N/A;  . PERIPHERAL VASCULAR CATHETERIZATION Right 01/26/2015   Procedure: A/V Fistulagram;  Surgeon: CAngelia Mould  MD;  Location: [place] [facility];  Service: Cardiovascular;  Laterality: Right;  . reapea urethral surgery for recurrent obstruction  2011  . TOTAL KNEE ARTHROPLASTY Right 2007  . VEIN HARVEST Right 03/31/2016   Procedure: RIGHT GREATER SAPHENOUS VEIN HARVEST;  Surgeon: Angelia Mould, MD;  Location: [place];  Service: Vascular;  Laterality: Right;     reports that he quit smoking about 9 years ago. His smoking use included cigarettes. He has a 64.00 pack-year smoking history. He has never used smokeless tobacco. He reports that he does not drink alcohol and does not use drugs.  Allergies  Allergen Reactions  . Morphine Sulfate Rash and Other (See Comments)     Itches all over, red spots    Family History  Problem Relation Age of Onset  . Bladder Cancer Mother   . Alcohol abuse Father   . Melanoma Father   . Stroke Maternal Grandmother   . Heart Problems Maternal Grandmother        unknown  . Diabetes Maternal Grandmother   . Heart disease Maternal Grandfather   . Prostate cancer Maternal Grandfather      Prior to Admission medications   Medication Sig Start Date End Date Taking? Authorizing Provider  albuterol (PROVENTIL HFA;VENTOLIN HFA) 108 (90 Base) MCG/ACT inhaler Inhale 2 puffs into the lungs every 4 (four) hours as needed for wheezing or shortness of breath. 02/16/18   Burchette, Alinda Sierras, MD  amoxicillin-clavulanate (AUGMENTIN) 875-125 MG tablet Take 1 tablet by mouth every 12 (twelve) hours. 07/01/19   Eustaquio Maize, PA-C  aspirin EC 81 MG tablet Take 81 mg by mouth daily.     [provider]  atorvastatin (LIPITOR) 80 MG tablet Take 1 tablet (80 mg total) by mouth at bedtime. 05/08/19   Burchette, Alinda Sierras, MD  azelastine (ASTELIN) 0.1 % nasal spray Place 2 sprays into both nostrils 3 (three) times daily as needed for rhinitis. Use in each nostril as directed 03/10/14   Burchette, Alinda Sierras, MD  carvedilol (COREG) 12.5 MG tablet Take 1 tablet (12.5 mg total) by mouth 2 (two) times daily with a meal. 05/08/19   Burchette, Alinda Sierras, MD  Continuous Blood Gluc Sensor (FREESTYLE LIBRE 14 DAY SENSOR) MISC Inject 1 Device into the skin every 14 (fourteen) days. 07/31/18   Burchette, Alinda Sierras, MD  fenofibrate 160 MG tablet Take 1 tablet (160 mg total) by mouth daily. 05/08/19   Burchette, Alinda Sierras, MD  fexofenadine (ALLEGRA) 180 MG tablet Take 180 mg by mouth daily.    [provider]  furosemide (LASIX) 40 MG tablet Take 3 tablets by mouth once daily 08/07/18   Burchette, Alinda Sierras, MD  gabapentin (NEURONTIN) 300 MG capsule Take 2 capsules (600 mg total) by mouth 2 (two) times daily. 05/08/19   Burchette, Alinda Sierras, MD  glucose blood test  strip Check 1 time daily. E11.9 One Touch Ultra Blue Test Strips 03/10/14   [name], MD  Insulin Glargine [facility]) 100 UNIT/ML INJECT 70 UNITS SUBCUTANEOUSLY AT BEDTIME 05/08/19   Burchette, Alinda Sierras, MD  Insulin Lispro (HUMALOG KWIKPEN) 200 UNIT/ML SOPN Inject 10 Units into the skin 3 (three) times daily with meals. Use tid with meals as per sliding scale 03/16/18   Burchette, Alinda Sierras, MD  Insulin Pen Needle (BD PEN NEEDLE NANO U/F) 32G X 4 MM MISC USE 1 PEN NEEDLE SUBCUTANEOUSLY WITH INSULIN 4 TIMES DAILY 12/21/18   Burchette, Alinda Sierras, MD  lanthanum (FOSRENOL) 1000  MG chewable tablet Chew 1,000 mg by mouth 3 (three) times daily with meals. Take one tablet after each meal.     [provider]  multivitamin (RENA-VIT) TABS tablet Take 1 tablet by mouth daily. 03/21/19   Burchette, Alinda Sierras, MD  Olopatadine HCl (PATADAY) 0.2 % SOLN Place 1 drop into both eyes daily as needed (for allergies).     [provider]    Physical Exam: There were no vitals filed for this visit.   Constitutional: NAD, calm  Eyes: PERTLA, lids and conjunctivae normal ENMT: Mucous membranes are moist. Posterior pharynx clear of any exudate or lesions.   Neck: normal, supple, no masses, no thyromegaly Respiratory: no wheezing, no accessory muscle use. Speaking full sentences.  Cardiovascular: S1 & S2 heard, regular rate and rhythm. Left ankle edema.  Abdomen: No distension, no tenderness, soft. Bowel sounds active.  Musculoskeletal: no clubbing / cyanosis. Status-post right BKA.   Skin: no significant rashes, lesions, ulcers. Warm, dry, well-perfused. Neurologic: CN 2-12 grossly intact. Sensation intact. Moving all extremities.  Psychiatric: Alert and oriented to person, place, and situation. Very pleasant and cooperative.    Labs and Imaging on Admission: I have personally reviewed following labs and imaging studies  CBC: No results for input(s): WBC, NEUTROABS, HGB, HCT, MCV, PLT in  the last 168 hours. Basic Metabolic Panel: No results for input(s): NA, K, CL, CO2, GLUCOSE, BUN, CREATININE, CALCIUM, MG, PHOS in the last 168 hours. GFR: Estimated Creatinine Clearance: 8.8 mL/min (A) (by C-G formula based on SCr of 11.02 mg/dL (H)). Liver Function Tests: No results for input(s): AST, ALT, ALKPHOS, BILITOT, PROT, ALBUMIN in the last 168 hours. No results for input(s): LIPASE, AMYLASE in the last 168 hours. No results for input(s): AMMONIA in the last 168 hours. Coagulation Profile: No results for input(s): INR, PROTIME in the last 168 hours. Cardiac Enzymes: No results for input(s): CKTOTAL, CKMB, CKMBINDEX, TROPONINI in the last 168 hours. BNP (last 3 results) No results for input(s): PROBNP in the last 8760 hours. HbA1C: No results for input(s): HGBA1C in the last 72 hours. CBG: No results for input(s): GLUCAP in the last 168 hours. Lipid Profile: No results for input(s): CHOL, HDL, LDLCALC, TRIG, CHOLHDL, LDLDIRECT in the last 72 hours. Thyroid Function Tests: No results for input(s): TSH, T4TOTAL, FREET4, T3FREE, THYROIDAB in the last 72 hours. Anemia Panel: No results for input(s): VITAMINB12, FOLATE, FERRITIN, TIBC, IRON, RETICCTPCT in the last 72 hours. Urine analysis:    Component Value Date/Time   COLORURINE yellow 09/28/2009 1317   APPEARANCEUR Clear 09/28/2009 1317   LABSPEC 1.010 09/28/2009 1317   PHURINE 5.0 09/28/2009 1317   HGBUR 2+ 09/28/2009 1317   BILIRUBINUR negative 03/17/2016 1648   PROTEINUR trace 03/17/2016 1648   UROBILINOGEN 0.2 03/17/2016 1648   UROBILINOGEN 0.2 09/28/2009 1317   NITRITE negative 03/17/2016 1648   NITRITE negative 09/28/2009 1317   LEUKOCYTESUR small (1+) (A) 03/17/2016 1648   Sepsis Labs: _0 (procalcitonin:4,lacticidven:4) )No results found for this or any previous visit (from the past 240 hour(s)).   Radiological Exams on Admission: No results found.  EKG: Independently reviewed. Sinus rhythm, LVH.    Assessment/Plan   1. Acute hypoxic respiratory failure; pulmonary edema; pneumonia  - Patient reports improving after several days of abx for PNA but developed acute-onset SOB with saturation in 70s and BP 200s/100s  - CXR with increased airspace opacities bilaterally; there is a leukocytosis to 15k with normal lactate and no fever  - He was  cultured in ED and treated with BiPAP, Lasix, and Zosyn  - [facility] to [facility], patient reports marked improvement, is no longer on BiPAP and has normal RR  - Suspect flash pulmonary edema given the acute-onset, severe HTN, and then rapid improvement  - Check procalcitonin and continue empiric antibiotics for now, continue diuresis with IV Lasix, continue as-needed supplemental O2, discuss dialysis with nephrology in am   2. ESRD  - He is hypervolemic on admission but not in respiratory distress and no longer on BiPAP  - SLIV, fluid-restrict, continue binder, continue Lasix, renally-dose medications    3. Hypertension  - BP was 200s/100s with EMS and treated with NTG pta in ED  - SBP has been controlled after the single dose of NTG with EMS, will continue Coreg and Lasix    4. COPD  - Appears stable on admission  - Continue inhalers   5. CAD - No anginal complaints, continue ASA and statin    6. Insulin-dependent DM  - A1c 8.9% one year ago  - Continue CBG checks and insulin     DVT prophylaxis: sq heparin  Code Status: Full  Family Communication: Discussed with patient  Disposition Plan:  Patient is from: Home  Anticipated d/c is to: Home  Anticipated d/c date is: 07/11/19 Patient currently: has new supplemental O2 requirement  Consults called: None Admission status: Inpatient     Vianne Bulls, MD [facility] Pager: See [email or web address]  If 7AM-7PM, please contact the daytime attending [email or web address]  07/10/2019, 3:41 AM

## 2019-07-10 NOTE — Progress Notes (Signed)
59 year old male with ESRD on dialysis Tuesday Thursday Saturdays admitted this morning with respiratory distress was treated with BiPAP in the ED, started on IV antibiotics.  Patient to have dialysis today.  Discussed with nephrology.

## 2019-07-10 NOTE — Consult Note (Signed)
[facility] Renal Consultation Note  Requesting MD: Landis Gandy, MD Indication for Consultation:  ESRD  Chief complaint: shortness of breath   HPI:  Anthony Bullock is a 59 y.o. male with a history including end-stage renal disease on dialysis Tuesday Thursday Saturday at [facility], chronic systolic CHF, AICD in place, diabetes, and hypertension, who presented to the hospital with shortness of breath.  He missed dialysis on Tuesday 6/29 as he was in the ER at an outside facility; thus last HD was 6/26.  Shortness of breath has been worsening about the past week.  He states that this was accompanied by a cough (prod of yellow/green phlegm) and he had been started on antibiotics (finished augmentin on Sunday of this week) which initially had improved his symptoms but then breathing got worse.  He called EMS and per charting had saturations in the 70s on room air with a blood pressure of 200/100.  He had nitro and CPAP in route to the ER.  He was transitioned to BiPAP in the [place] ER.  He was empirically given Lasix and Zosyn per charting.  Outside chest x-ray was concerning for bilateral patchy progressive airspace disease.  This morning he has been transitioned to 3 liters oxygen.  He states that he doesn't wear any oxygen at home.  He denies any shortness of breath at present.  covid test was negative here.  He has had nausea here with one episode of emesis; also had two instances of diarrhea.  Nausea better now. He has been ordered for azithro and ceftriaxone here.  Got lasix 80 mg IV which is ordered every 12 hours presently.  He states that he does take lasix 120 mg PO daily at home - makes very little urine.  States that he holds coreg before dialysis because when he would take it his pressure would drop.  Patient reports multiple prior failed access in [place].  His HD unit states that he refuses to let them use his most recent access.  PMHx:   Past Medical History:   Diagnosis Date  . 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 Allergic rhinitis   . Anemia   . Arthritis   . Chronic systolic heart failure ([place])    a. ECHO (12/2012) EF 25-30%, HK entireanteroseptal myocardium //  b.  EF 25%, diffuse HK, grade 1 diastolic dysfunction, MAC, mild LAE, normal RVSF, trivial pericardial effusion  . COPD (chronic obstructive pulmonary disease) ([place])   . Diabetes mellitus type II   . Diabetic nephropathy ([place])   . Diabetic neuropathy ([place])   . ESRD on hemodialysis [facility])    started HD June 2017, goes to [facility] HD unit, Dr Hinda Lenis  . History of cardiac catheterization    a.Myoview 1/15:  There is significant left ventricular dysfunction. There may be slight scar at the apex. There is no significant ischemia. LV Ejection Fraction: 27%  //  b. RHC/LHC (1/15) with mean RA 6, PA 47/22 mean 33, mean PCWP 20, PVR 2.5 WU, CI 2.5; 80% dLAD stenosis, 70% diffuse large D.   . History of kidney stones   . Hyperlipidemia   . Hypertension   . Kidney stones   . NICM (nonischemic cardiomyopathy) ([place])    Primarily nonischemic. Echo (12/14) with EF 25-30%. Echo (3/15) with EF 25%, mild to moderately dilated LV, normal RV size and systolic function.   . Pneumonia   . Urethral stricture     Past Surgical  History:  Procedure Laterality Date  . ABDOMINAL AORTOGRAM W/LOWER EXTREMITY N/A 03/30/2016   Procedure: Abdominal Aortogram w/Lower Extremity;  Surgeon: Angelia Mould, MD;  Location: [place] CV [facility];  Service: Cardiovascular;  Laterality: N/A;  . AMPUTATION Right 04/26/2016   Procedure: Right Below Knee Amputation;  Surgeon: Newt Minion, MD;  Location: [place];  Service: Orthopedics;  Laterality: Right;  . AV FISTULA PLACEMENT Right 09/08/2015   Procedure: INSERTION OF 4-61m x 45cm  ARTERIOVENOUS (AV) GORE-TEX GRAFT RIGHT UPPER  ARM;  Surgeon: CAngelia Mould MD;  [place]  Service: Vascular;  Laterality:  Right;  . AV FISTULA PLACEMENT Left 01/14/2016   Procedure: CREATION OF LEFT UPPER ARM ARTERIOVENOUS FISTULA;  Surgeon: CAngelia Mould MD;  [place]  Service: Vascular;  Laterality: Left;  . BASCILIC VEIN TRANSPOSITION Right 08/22/2014   Procedure: RIGHT UPPER ARM BASCILIC VEIN TRANSPOSITION;  Surgeon: CAngelia Mould MD;  [place]  Service: Vascular;  Laterality: Right;  . BELOW KNEE LEG AMPUTATION Right 04/26/2016  . CARDIAC CATHETERIZATION    . CARDIAC DEFIBRILLATOR PLACEMENT  06/27/2013   Sub Q       BY DR KCaryl Comes . CATARACT EXTRACTION W/PHACO Right 08/06/2018   Procedure: CATARACT EXTRACTION PHACO AND INTRAOCULAR LENS PLACEMENT (IOC);  Surgeon: WBaruch Goldmann MD;  Location: [facility];  Service: Ophthalmology;  Laterality: Right;  CDE: 4.06  . CATARACT EXTRACTION W/PHACO Left 08/20/2018   Procedure: CATARACT EXTRACTION PHACO AND INTRAOCULAR LENS PLACEMENT (IOC);  Surgeon: WBaruch Goldmann MD;  Location: [facility];  Service: Ophthalmology;  Laterality: Left;  CDE: 6.76  . COLONOSCOPY WITH PROPOFOL N/A 07/22/2015   Procedure: COLONOSCOPY WITH PROPOFOL;  Surgeon: HDoran Stabler MD;  [facility]: [facility];  Service: Gastroenterology;  Laterality: N/A;  . FEMORAL-POPLITEAL BYPASS GRAFT Right 03/31/2016   Procedure: BYPASS GRAFT FEMORAL-POPLITEAL ARTERY USING RIGHT GREATER SAPHENOUS NONREVERSED VEIN;  Surgeon: CAngelia Mould MD;  [place]  Service: Vascular;  Laterality: Right;  . HERNIA REPAIR    . I & D EXTREMITY Right 03/31/2016   Procedure: IRRIGATION AND DEBRIDEMENT FOOT;  Surgeon: CAngelia Mould MD;  [place]  Service: Vascular;  Laterality: Right;  . IMPLANTABLE CARDIOVERTER DEFIBRILLATOR IMPLANT N/A 06/27/2013   Procedure: SUB Q ICD;  Surgeon: SDeboraha Sprang MD;  [facility];  Service: Cardiovascular;  Laterality: N/A;  . INTRAOPERATIVE ARTERIOGRAM Right 03/31/2016   Procedure: INTRA OPERATIVE ARTERIOGRAM;  Surgeon: CAngelia Mould MD;  [place]  Service: Vascular;  Laterality: Right;  . IR GENERIC HISTORICAL Right 11/30/2015   IR THROMBECTOMY AV FISTULA W/THROMBOLYSIS/PTA INC/SHUNT/IMG RIGHT 11/30/2015 GAletta Edouard MD [facility]  . IR GENERIC HISTORICAL  11/30/2015   IR [place] ACCESS RIGHT 11/30/2015 GAletta Edouard MD [facility]  . IR GENERIC HISTORICAL Right 12/15/2015   IR THROMBECTOMY AV FISTULA W/THROMBOLYSIS/PTA/STENT INC/SHUNT/IMG RT 12/15/2015 DArne Cleveland MD [facility]  . IR GENERIC HISTORICAL  12/15/2015   IR [place] VASC ACCESS RIGHT 12/15/2015 DArne Cleveland MD [facility]  . IR GENERIC HISTORICAL  12/28/2015   IR FLUORO GUIDE CV LINE RIGHT 12/28/2015 AMarybelle Killings MD [facility]  . IR GENERIC HISTORICAL  12/28/2015   IR [place] VASC ACCESS RIGHT 12/28/2015 AMarybelle Killings MD [facility]  . LEFT A ND RIGHT HEART CATH  01/30/2013   DR BSung Amabile . LEFT AND RIGHT HEART CATHETERIZATION WITH CORONARY ANGIOGRAM N/A 01/30/2013   Procedure: LEFT AND  RIGHT HEART CATHETERIZATION WITH CORONARY ANGIOGRAM;  Surgeon: Jolaine Artist, MD;  Location: [facility] C[facility] LAB;  Service: Cardiovascular;  Laterality: N/A;  . PERIPHERAL VASCULAR CATHETERIZATION Right 01/26/2015   Procedure: A/V Fistulagram;  Surgeon: Angelia Mould, MD;  Location: [place] [facility];  Service: Cardiovascular;  Laterality: Right;  . reapea urethral surgery for recurrent obstruction  2011  . TOTAL KNEE ARTHROPLASTY Right 2007  . VEIN HARVEST Right 03/31/2016   Procedure: RIGHT GREATER SAPHENOUS VEIN HARVEST;  Surgeon: Angelia Mould, MD;  Location: [facility] OR;  Service: Vascular;  Laterality: Right;    Family Hx:  Family History  Problem Relation Age of Onset  . Bladder Cancer Mother   . Alcohol abuse Father   . Melanoma Father   . Stroke Maternal Grandmother   . Heart Problems Maternal Grandmother        unknown  . Diabetes Maternal Grandmother   . Heart disease Maternal Grandfather   . Prostate  cancer Maternal Grandfather   no family history of ESRD  Social History:  reports that he quit smoking about 9 years ago. His smoking use included cigarettes. He has a 64.00 pack-year smoking history. He has never used smokeless tobacco. He reports that he does not drink alcohol and does not use drugs.  Allergies:  Allergies  Allergen Reactions  . Morphine Sulfate Rash and Other (See Comments)    Itches all over, red spots    Medications: Prior to Admission medications   Medication Sig Start Date End Date Taking? Authorizing Provider  albuterol (PROVENTIL HFA;VENTOLIN HFA) 108 (90 Base) MCG/ACT inhaler Inhale 2 puffs into the lungs every 4 (four) hours as needed for wheezing or shortness of breath. 02/16/18   Burchette, Alinda Sierras, MD  amoxicillin-clavulanate (AUGMENTIN) 875-125 MG tablet Take 1 tablet by mouth every 12 (twelve) hours. 07/01/19   Eustaquio Maize, PA-C  aspirin EC 81 MG tablet Take 81 mg by mouth daily.     [provider]  atorvastatin (LIPITOR) 80 MG tablet Take 1 tablet (80 mg total) by mouth at bedtime. 05/08/19   Burchette, Alinda Sierras, MD  azelastine (ASTELIN) 0.1 % nasal spray Place 2 sprays into both nostrils 3 (three) times daily as needed for rhinitis. Use in each nostril as directed 03/10/14   Burchette, Alinda Sierras, MD  carvedilol (COREG) 12.5 MG tablet Take 1 tablet (12.5 mg total) by mouth 2 (two) times daily with a meal. 05/08/19   Burchette, Alinda Sierras, MD  Continuous Blood Gluc Sensor (FREESTYLE LIBRE 14 DAY SENSOR) MISC Inject 1 Device into the skin every 14 (fourteen) days. 07/31/18   Burchette, Alinda Sierras, MD  fenofibrate 160 MG tablet Take 1 tablet (160 mg total) by mouth daily. 05/08/19   Burchette, Alinda Sierras, MD  fexofenadine (ALLEGRA) 180 MG tablet Take 180 mg by mouth daily.    [provider]  furosemide (LASIX) 40 MG tablet Take 3 tablets by mouth once daily 08/07/18   Burchette, Alinda Sierras, MD  gabapentin (NEURONTIN) 300 MG capsule Take 2 capsules (600 mg  total) by mouth 2 (two) times daily. 05/08/19   Burchette, Alinda Sierras, MD  glucose blood test strip Check 1 time daily. E11.9 One Touch Ultra Blue Test Strips 03/10/14   [name], MD  Insulin Glargine [facility]) 100 UNIT/ML INJECT 70 UNITS SUBCUTANEOUSLY AT BEDTIME 05/08/19   Burchette, Alinda Sierras, MD  Insulin Lispro (HUMALOG KWIKPEN) 200 UNIT/ML SOPN Inject 10 Units into the skin 3 (three) times daily with  meals. Use tid with meals as per sliding scale 03/16/18   Burchette, Alinda Sierras, MD  Insulin Pen Needle (BD PEN NEEDLE NANO U/F) 32G X 4 MM MISC USE 1 PEN NEEDLE SUBCUTANEOUSLY WITH INSULIN 4 TIMES DAILY 12/21/18   Burchette, Alinda Sierras, MD  lanthanum (FOSRENOL) 1000 MG chewable tablet Chew 1,000 mg by mouth 3 (three) times daily with meals. Take one tablet after each meal.     [provider]  multivitamin (RENA-VIT) TABS tablet Take 1 tablet by mouth daily. 03/21/19   Burchette, Alinda Sierras, MD  Olopatadine HCl (PATADAY) 0.2 % SOLN Place 1 drop into both eyes daily as needed (for allergies).     [provider]    I have reviewed the patient's current and prior to admission medications.  Labs:  BMP Latest Ref Rng & Units 07/10/2019 07/01/2019 08/06/2018  Glucose 70 - 99 mg/dL 135(H) 331(H) 145(H)  BUN 6 - 20 mg/dL 102(H) 65(H) 68(H)  Creatinine 0.61 - 1.24 mg/dL 15.24(H) 11.02(H) 10.48(H)  Sodium 135 - 145 mmol/L 131(L) 132(L) 133(L)  Potassium 3.5 - 5.1 mmol/L 5.1 5.9(H) 4.8  Chloride 98 - 111 mmol/L 94(L) 92(L) 94(L)  CO2 22 - 32 mmol/L 21(L) 26 24  Calcium 8.9 - 10.3 mg/dL 8.4(L) 8.8(L) 8.6(L)    ROS:  Pertinent items noted in HPI and remainder of comprehensive ROS otherwise negative.    Physical Exam: Vitals:   07/10/19 0600 07/10/19 0748  BP: 137/65 120/66  Pulse: 83   Resp: 18   Temp:  98.4 F (36.9 C)  SpO2: 98%      General: adult male seated in NAD    HEENT: NCAT Eyes: EOMI sclera anicteric Neck: supple trachea midline Heart: S1S2 no rub Lungs:  basilar crackles; unlabored at rest; on 3 liters oxygen  Abdomen: softly /NT/ND Extremities: 1+ edema left leg and right BKA Skin: no rash on extremities exposed Neuro: alert and oriented x3; provides hx and follows commands Psych - normal mood and affect  Access: RIJ tunneled dialysis catheter  Outpatient HD orders:  [facility] T[facility]  BF 400  DF 600  EDW 95.5 kg Time 3 hrs 45 min revaclear 300 (standard there)  Bath 2K/2.5 Ca bath  Meds: 2.5 mcg hectorol each tx and epo 3000 units each tx; 82m sensipar per treatment  On heparin  Last post weight from 6/26: 95.6 kg (with a 3.5 kg UF that day)  They pull a max of 1.1 liters/hr (standard at that clinic) Unit states that he refuses to use his access; uses his catheter  Assessment/Plan:  # ESRD  [facility] then [facility] back to his TTS schedule   # Acute hypoxic resp failure  - 2/2 PNA and component of fluid as well  - abx per primary team and UF with HD   # HCAP  - Antibiotics per primary team   # HTN  - Improved control here - Optimize volume with HD    # Chronic systolic CHF  - Optimize volume with HD  - Can discontinue IV lasix for now   # Anemia of ESRD  - Hb at goal   # Metabolic bone disease  - Continue binders and on a renal diet; continue hectorol; on sensipar outpatient - watch Ca here  LClaudia Desanctis6/30/2021, 8:34 AM

## 2019-07-10 NOTE — Procedures (Signed)
Seen and examined on dialysis at 14:08pm; procedure supervised.  Blood pressure 116/53 and 90 HR.  RIJ tunneled catheter.  Tolerating goal- increased goal to 4.5 kg.  Per nursing he was in resp distress earlier when he came to HD and resp therapy was called - they placed on bipap.  Pt states he feels better now and is comfortable on bipap on my exam.   Claudia Desanctis, MD 07/10/2019 2:13 PM

## 2019-07-11 LAB — CBC WITH DIFFERENTIAL/PLATELET
Abs Immature Granulocytes: 0.13 10*3/uL — ABNORMAL HIGH (ref 0.00–0.07)
Basophils Absolute: 0.1 10*3/uL (ref 0.0–0.1)
Basophils Relative: 1 %
Eosinophils Absolute: 0 10*3/uL (ref 0.0–0.5)
Eosinophils Relative: 0 %
HCT: 32.1 % — ABNORMAL LOW (ref 39.0–52.0)
Hemoglobin: 10.4 g/dL — ABNORMAL LOW (ref 13.0–17.0)
Immature Granulocytes: 1 %
Lymphocytes Relative: 8 %
Lymphs Abs: 1 10*3/uL (ref 0.7–4.0)
MCH: 29.2 pg (ref 26.0–34.0)
MCHC: 32.4 g/dL (ref 30.0–36.0)
MCV: 90.2 fL (ref 80.0–100.0)
Monocytes Absolute: 1.2 10*3/uL — ABNORMAL HIGH (ref 0.1–1.0)
Monocytes Relative: 10 %
Neutro Abs: 10.1 10*3/uL — ABNORMAL HIGH (ref 1.7–7.7)
Neutrophils Relative %: 80 %
Platelets: 136 10*3/uL — ABNORMAL LOW (ref 150–400)
RBC: 3.56 MIL/uL — ABNORMAL LOW (ref 4.22–5.81)
RDW: 15.1 % (ref 11.5–15.5)
WBC: 12.5 10*3/uL — ABNORMAL HIGH (ref 4.0–10.5)
nRBC: 0 % (ref 0.0–0.2)

## 2019-07-11 LAB — BASIC METABOLIC PANEL
Anion gap: 16 — ABNORMAL HIGH (ref 5–15)
BUN: 64 mg/dL — ABNORMAL HIGH (ref 6–20)
CO2: 21 mmol/L — ABNORMAL LOW (ref 22–32)
Calcium: 8 mg/dL — ABNORMAL LOW (ref 8.9–10.3)
Chloride: 96 mmol/L — ABNORMAL LOW (ref 98–111)
Creatinine, Ser: 11.74 mg/dL — ABNORMAL HIGH (ref 0.61–1.24)
GFR calc Af Amer: 5 mL/min — ABNORMAL LOW (ref >60)
GFR calc non Af Amer: 4 mL/min — ABNORMAL LOW (ref >60)
Glucose, Bld: 191 mg/dL — ABNORMAL HIGH (ref 70–99)
Potassium: 5.2 mmol/L — ABNORMAL HIGH (ref 3.5–5.1)
Sodium: 133 mmol/L — ABNORMAL LOW (ref 135–145)

## 2019-07-11 LAB — GLUCOSE, CAPILLARY
Glucose-Capillary: 113 mg/dL — ABNORMAL HIGH (ref 70–99)
Glucose-Capillary: 135 mg/dL — ABNORMAL HIGH (ref 70–99)
Glucose-Capillary: 176 mg/dL — ABNORMAL HIGH (ref 70–99)

## 2019-07-11 LAB — PROCALCITONIN: Procalcitonin: 6.77 ng/mL

## 2019-07-11 LAB — HEMOGLOBIN A1C
Hgb A1c MFr Bld: 8.1 % — ABNORMAL HIGH (ref 4.8–5.6)
Mean Plasma Glucose: 186 mg/dL

## 2019-07-11 MED ORDER — ALTEPLASE 2 MG IJ SOLR: 2.0000 mg | Freq: Once | INTRAMUSCULAR | Status: DC | PRN

## 2019-07-11 MED ORDER — SODIUM CHLORIDE 0.9 % IV SOLN: 100.0000 mL | INTRAVENOUS | Status: DC | PRN

## 2019-07-11 MED ORDER — DOXERCALCIFEROL 4 MCG/2ML IV SOLN
2.5000 ug | INTRAVENOUS | Status: DC
  Administered 2019-07-11: 2.5 ug via INTRAVENOUS
  Filled 2019-07-11: qty 2

## 2019-07-11 MED ORDER — LIDOCAINE-PRILOCAINE 2.5-2.5 % EX CREA
1.0000 "application " | TOPICAL_CREAM | CUTANEOUS | Status: DC | PRN
  Filled 2019-07-11: qty 5

## 2019-07-11 MED ORDER — HEPARIN SODIUM (PORCINE) 1000 UNIT/ML IJ SOLN
INTRAMUSCULAR | Status: AC
  Administered 2019-07-11: 1000 [IU]
  Filled 2019-07-11: qty 4

## 2019-07-11 MED ORDER — AMOXICILLIN-POT CLAVULANATE 875-125 MG PO TABS: 1.0000 | ORAL_TABLET | Freq: Two times a day (BID) | ORAL | 0 refills | Status: AC

## 2019-07-11 MED ORDER — PENTAFLUOROPROP-TETRAFLUOROETH EX AERO: 1.0000 "application " | INHALATION_SPRAY | CUTANEOUS | Status: DC | PRN

## 2019-07-11 MED ORDER — LIDOCAINE HCL (PF) 1 % IJ SOLN: 5.0000 mL | INTRAMUSCULAR | Status: DC | PRN

## 2019-07-11 MED ORDER — DOXERCALCIFEROL 4 MCG/2ML IV SOLN
INTRAVENOUS | Status: AC
  Filled 2019-07-11: qty 2

## 2019-07-11 MED ORDER — HEPARIN SODIUM (PORCINE) 1000 UNIT/ML DIALYSIS
1000.0000 [IU] | INTRAMUSCULAR | Status: DC | PRN
  Filled 2019-07-11: qty 1

## 2019-07-11 MED ORDER — AZITHROMYCIN 500 MG PO TABS
500.0000 mg | ORAL_TABLET | Freq: Every day | ORAL | Status: DC
  Administered 2019-07-11: 500 mg via ORAL
  Filled 2019-07-11: qty 1

## 2019-07-11 NOTE — Progress Notes (Signed)
SATURATION QUALIFICATIONS: (This note is used to comply with regulatory documentation for home oxygen)  Patient Saturations on Room Air at Rest = 94%  Patient Saturations on Room Air while Ambulating = 89%  Patient Saturations on 0 Liters of oxygen while Ambulating = N/A  Please briefly explain why patient needs home oxygen: N/A

## 2019-07-11 NOTE — Progress Notes (Signed)
Discharge education and medication education given to patient and spouse with teach back. Education on increasing activity slowly, when to call MD, and low sodium heart healthy diet given. All questions and concerns answered.  Peripheral IV and telemetry leads removed. All patient belongings given to patient. Patient transported to main entrance by nurse via wheelchair.

## 2019-07-11 NOTE — Progress Notes (Signed)
[facility] Progress Note  Name: Anthony Bullock MRN: [id number] DOB: [birth year]  Chief Complaint:  Shortness of breath   Subjective:  Seen and examined on dialysis at 8:09 am; procedure supervised.  Blood pressure 125/61 and HR 97.  Tolerating goal.  RIJ tunneled catheter.  States he was told may go today if gets oxygen off.  Pre-HD was 96.5 kg.  Has been on 4 liters.  Goal of 3.5 kg currently.  Review of systems:  Denies any shortness of breath, even with exertion Denies chest pain  Denies n/v ----------- Background on consult:  Anthony Bullock is a 59 y.o. male with a history including end-stage renal disease on dialysis Tuesday Thursday Saturday at [facility], chronic systolic CHF, AICD in place, diabetes, and hypertension, who presented to the hospital with shortness of breath.  He missed dialysis on Tuesday 6/29 as he was in the ER at an outside facility; thus last HD was 6/26.  Shortness of breath has been worsening about the past week.  He states that this was accompanied by a cough (prod of yellow/green phlegm) and he had been started on antibiotics (finished augmentin on Sunday of this week) which initially had improved his symptoms but then breathing got worse.  He called EMS and per charting had saturations in the 70s on room air with a blood pressure of 200/100.  He had nitro and CPAP in route to the ER.  He was transitioned to BiPAP in the [place] ER.  He was empirically given Lasix and Zosyn per charting.  Outside chest x-ray was concerning for bilateral patchy progressive airspace disease.  This morning he has been transitioned to 3 liters oxygen.  He states that he doesn't wear any oxygen at home.  He denies any shortness of breath at present.  covid test was negative here.  He has had nausea here with one episode of emesis; also had two instances of diarrhea.  Nausea better now. He has been ordered for azithro and ceftriaxone here.  Got lasix 80 mg IV which is  ordered every 12 hours presently.  He states that he does take lasix 120 mg PO daily at home - makes very little urine.  States that he holds coreg before dialysis because when he would take it his pressure would drop.  Patient reports multiple prior failed access in [place].  His HD unit states that he refuses to let them use his most recent access.     Intake/Output Summary (Last 24 hours) at 07/11/2019 0816 Last data filed at 07/10/2019 2101 Gross per 24 hour  Intake 243 ml  Output 4507 ml  Net -4264 ml    Vitals:  Vitals:   07/11/19 0653 07/11/19 0700 07/11/19 0730 07/11/19 0800  BP: (!) 154/72 (!) 151/67 (!) 126/58 125/61  Pulse: (!) 102 100 (!) 101 97  Resp: (!) 26 (!) 23 20 (!) 26  Temp:      TempSrc:      SpO2:      Weight:      Height:         Physical Exam:  General adult male in bed in no acute distress HEENT normocephalic atraumatic extraocular movements intact sclera anicteric Neck supple trachea midline Lungs clear and normal work of breathing at rest  Heart S1S2 no rub  Abdomen soft nontender nondistended Extremities no pitting edema left leg or right residual limb Psych normal mood and affect Neuro - alert and oriented x 3 provides hx and  follows commands Access  RIJ tunneled catheter   Medications reviewed  Labs:  BMP Latest Ref Rng & Units 07/11/2019 07/10/2019 07/01/2019  Glucose 70 - 99 mg/dL 191(H) 135(H) 331(H)  BUN 6 - 20 mg/dL 64(H) 102(H) 65(H)  Creatinine 0.61 - 1.24 mg/dL 11.74(H) 15.24(H) 11.02(H)  Sodium 135 - 145 mmol/L 133(L) 131(L) 132(L)  Potassium 3.5 - 5.1 mmol/L 5.2(H) 5.1 5.9(H)  Chloride 98 - 111 mmol/L 96(L) 94(L) 92(L)  CO2 22 - 32 mmol/L 21(L) 21(L) 26  Calcium 8.9 - 10.3 mg/dL 8.0(L) 8.4(L) 8.8(L)   Outpatient HD orders:  [facility]  BF 400  DF 600  EDW 95.5 kg Time 3 hrs 45 min revaclear 300 (standard there)  Bath 2K/2.5 Ca bath  Meds: 2.5 mcg hectorol each tx and epo 3000 units each tx; 30mg  sensipar per treatment  On  heparin  Last post weight from 6/26: 95.6 kg (with a 3.5 kg UF that day)  They pull a max of 1.1 liters/hr (standard at that clinic) Unit states that he refuses to use his access; uses his catheter   Assessment/Plan:   # ESRD  - Outpatient schedule is TTS at [place] HD here per TTS schedule  - challenging EDW as above   # Acute hypoxic resp failure  - 2/2 PNA and fluid as well  - abx per primary team and UF with HD   # HCAP  - Antibiotics per primary team  - If antibiotics are needed at outpatient HD (I.e. three times a week - please contact nephrology to notify us).  Not currently requested  # HTN  - Improved control here - Optimize volume with HD    # Chronic systolic CHF  - Optimize volume with HD   # Anemia of ESRD  - Hb at goal   # Metabolic bone disease  - Continue binders and on a renal diet; continue hectorol; on sensipar outpatient - held inpatient due to calcium.  Assess trends as an outpatient per HD unit   Claudia Desanctis, MD 07/11/2019 8:30 AM

## 2019-07-11 NOTE — TOC Progression Note (Signed)
Transition of Care [facility]) - Progression Note    Patient Details  Name: Anthony Bullock MRN: [id number] Date of Birth: 1960/02/22  [facility] of [facility]) CM/SW Contact  Zenon Mayo, RN Phone Number: 07/11/2019, 3:45 PM  Clinical Narrative:    NCM spoke with patient, asked if he would like to be set up with [facility] for CHF management, he states no. His wife is in the room to transport him home today. He has no other needs.        Expected Discharge Plan and Services           Expected Discharge Date: 07/11/19                                     Social Determinants of Health (SDOH) Interventions    Readmission Risk Interventions No flowsheet data found.

## 2019-07-11 NOTE — Progress Notes (Signed)
Renal Navigator faxed discharge summary and renal note to patient's OP HD clinic/[facility] to provide continuity of care.  Mcadoo Muzquiz Elizabeth, LCSW Renal Navigator [phone] 

## 2019-07-11 NOTE — Progress Notes (Signed)
Patient is refusing to take his antibiotics via IV route. Received ceftriaxone, but not azithromycin. Says he is not taking anymore IV antibiotics because it made him sick yesterday. RN asked if he was feeling sick now (since antibiotic was done infusing) patient says no, but he was nauseous and had diarrhea yesterday and will not take anymore IV medicine. Says he will try a pill, but doesn't want any antibiotics. Patient educated on antibiotic use, side effects, reason for taking them. Patient understands and refuses.

## 2019-07-11 NOTE — Discharge Summary (Addendum)
Physician Discharge Summary  Anthony Bullock UXL:[id number] DOB: [birth year] DOA: 07/10/2019  PCP: [name], MD  Admit date: 07/10/2019 Discharge date: 07/11/2019  Admitted From: Home Disposition: Home Recommendations for Outpatient Follow-up:  1. Follow up with PCP in 1-2 weeks 2. Please obtain BMP/CBC in one week  Home Health: None Equipment/Devices: None Discharge Condition stable CODE STATUS full code Diet recommendation: Cardiac renal Brief/Interim Summary:Anthony Bullock is a 59 y.o. male with medical history significant for coronary artery disease, cardiomyopathy, COPD, insulin-dependent diabetes mellitus, and ESRD on hemodialysis, now presenting to the emergency department for evaluation of shortness of breath. The patient reports that he developed a productive cough and dyspnea approximately 1 week prior to this, was started on antibiotics at that time and started to feel better before acutely worsening the night of 07/08/2019. He denies any associated fevers or chills, denies any chest pain, reports chronic stable leg swelling, and reports that he was last dialyzed on 07/06/2019. With his acute worsening in shortness of breath and feeling as though he cannot catch his breath, he called EMS and was found to be saturating in the 70s on room air with blood pressure of 200/100s. Patient was treated with nitroglycerin and CPAP en route to the emergency department.  [facility] ED Course: Upon arrival to the ED, patient is found to be saturating 91% on BiPAP, respiratory rate in the low 20s, blood pressure 177/82. EKG features sinus rhythm with LVH. Chest x-ray concerning for progressive patchy airspace disease in the bilateral mid and lower lungs. Chemistry panel with sodium one hundred thirty-two, normal potassium and bicarb, and BUN of seventy-four. CBC features a leukocytosis to fifteen thousand. Lactic acid was normal. proBNP elevated. Troponin was normal. Patient was placed on BiPAP  in the ED, blood cultures were collected, and he was treated with Lasix and Zosyn. Arrangements were made for transfer to [facility] for ongoing evaluation and management.  Discharge Diagnoses:  Principal Problem:   Acute respiratory failure with hypoxia ([place]) Active Problems:   Poorly controlled type II diabetes mellitus with renal complication (HCC)   COPD (chronic obstructive pulmonary disease) (HCC)   Chronic systolic congestive heart failure (HCC)   CAD (coronary artery disease)   ESRD (end stage renal disease) on dialysis ([place])   Hypertensive crisis   Pneumonia  #1 acute hypoxic respiratory failure/CAP/pulmonary edema patient admitted with acute shortness of breath with hypoxia saturating 70s on room air.  Chest x-ray consistent with increased airspace opacities bilaterally with leukocytosis.  He was treated with BiPAP Lasix and Zosyn in the hospital.  Patient had dialysis on 07/10/2019 and 07/11/2019.  On the day of discharge his pulse ox on ambulation is about 90%. He will be discharged on Augmentin for 4 more days. He feels anxious and safe to go home.PATIENT RECOVERED FASTER THAN EXPECTED.  #2 ESRD on dialysis Tuesday Thursday and Saturdays continue as outpatient.  #3 history of essential hypertension continue home meds Coreg and Lasix.  #4 history of COPD stable continue inhalers  #5 history of type 2 diabetes continue insulin as prior to admission  #6 history of CAD continue aspirin and statin.  Pressure Injury 04/04/16 Stage II -  Partial thickness loss of dermis presenting as a shallow open ulcer with a red, pink wound bed without slough. (Active)  04/04/16 1950  Location: Buttocks  Location Orientation: Right;Left  Staging: Stage II -  Partial thickness loss of dermis presenting as a shallow open ulcer with a red, pink  wound bed without slough.  Wound Description (Comments):   Present on Admission:     Estimated body mass index is 29.58 kg/m as calculated  from the following:   Height as of this encounter: 5\' 10"  (1.778 m).   Weight as of this encounter: 93.5 kg.  Discharge Instructions  Discharge Instructions    Diet - low sodium heart healthy   Complete by: As directed    Increase activity slowly   Complete by: As directed      Allergies as of 07/11/2019      Reactions   Morphine Sulfate Rash, Other (See Comments)   Itches all over, red spots      Medication List    TAKE these medications   albuterol 108 (90 Base) MCG/ACT inhaler Commonly known as: VENTOLIN HFA Inhale 2 puffs into the lungs every 4 (four) hours as needed for wheezing or shortness of breath.   amoxicillin-clavulanate 875-125 MG tablet Commonly known as: Augmentin Take 1 tablet by mouth 2 (two) times daily for 4 days. What changed: when to take this   aspirin EC 81 MG tablet Take 81 mg by mouth daily.   atorvastatin 80 MG tablet Commonly known as: LIPITOR Take 1 tablet (80 mg total) by mouth at bedtime.   azelastine 0.1 % nasal spray Commonly known as: Astelin Place 2 sprays into both nostrils 3 (three) times daily as needed for rhinitis. Use in each nostril as directed   Basaglar KwikPen 100 UNIT/ML INJECT 70 UNITS SUBCUTANEOUSLY AT BEDTIME   BD Pen Needle Nano U/F 32G X 4 MM Misc Generic drug: Insulin Pen Needle USE 1 PEN NEEDLE SUBCUTANEOUSLY WITH INSULIN 4 TIMES DAILY   carvedilol 12.5 MG tablet Commonly known as: COREG Take 1 tablet (12.5 mg total) by mouth 2 (two) times daily with a meal.   fenofibrate 160 MG tablet Take 1 tablet (160 mg total) by mouth daily.   fexofenadine 180 MG tablet Commonly known as: ALLEGRA Take 180 mg by mouth daily.   FreeStyle Libre 14 Day Sensor Misc Inject 1 Device into the skin every 14 (fourteen) days.   furosemide 40 MG tablet Commonly known as: LASIX Take 3 tablets by mouth once daily   gabapentin 300 MG capsule Commonly known as: NEURONTIN Take 2 capsules (600 mg total) by mouth 2 (two) times  daily.   glucose blood test strip Check 1 time daily. E11.9 One Touch Ultra Blue Test Strips   insulin lispro 200 UNIT/ML KwikPen Commonly known as: HumaLOG KwikPen Inject 10 Units into the skin 3 (three) times daily with meals. Use tid with meals as per sliding scale   lanthanum 1000 MG chewable tablet Commonly known as: FOSRENOL Chew 1,000 mg by mouth 3 (three) times daily with meals. Take one tablet after each meal.   multivitamin Tabs tablet Take 1 tablet by mouth daily.   Pataday 0.2 % Soln Generic drug: Olopatadine HCl Place 1 drop into both eyes daily as needed (for allergies).       Follow-up Information    Burchette, Alinda Sierras, MD Follow up.   Specialty: Family Medicine Contact information: [place] [address] [phone]        Arnoldo Lenis, MD .   Specialty: Cardiology Contact information: [place] [address] [phone]        Deboraha Sprang, MD .   Specialty: Cardiology Contact information: [phone] [address]. [address] [place] [address] [phone]  Allergies  Allergen Reactions  . Morphine Sulfate Rash and Other (See Comments)    Itches all over, red spots    Consultations:  Nephrology   Procedures/Studies: DG Chest 2 View  Result Date: 07/01/2019 CLINICAL DATA:  Short of breath EXAM: CHEST - 2 VIEW COMPARISON:  11/30/2015 FINDINGS: Right jugular central venous catheter tip with the tip at the cavoatrial junction. Heart size upper normal. Negative for heart failure or edema. Mild patchy bibasilar airspace disease. External cardiac defibrillator in the anterior soft tissues is unchanged from the prior study. IMPRESSION: Mild patchy bibasilar airspace disease likely atelectasis versus pneumonia. Negative for heart failure Electronically Signed   By: Franchot Gallo M.D.   On: 07/01/2019 10:53   CUP PACEART INCLINIC DEVICE CHECK  Result Date:  06/26/2019 Subcutaneous ICD check in clinic. 0 untreated episodes; 0 treated episodes; 0 shocks delivered. Electrode impedance status okay. No programming changes. Remaining longevity to ERI 7%. (approx 6 month) Tone demonstrated for patient in office today. RTC 3 and 6 months for continued battery monitoring without remotes. Discussed with [employer] rep, Joey. See scanned report.   (Echo, Carotid, EGD, Colonoscopy, ERCP)    Subjective:  Patient is anxious to go home and he wants to go home ambulated in the hallway maintaining saturation above 90%. Discharge Exam: Vitals:   07/11/19 1030 07/11/19 1049  BP: 130/65 (!) 146/55  Pulse: 99 (!) 102  Resp: (!) 25 17  Temp: 97.8 F (36.6 C) 98.9 F (37.2 C)  SpO2:  96%   Vitals:   07/11/19 1000 07/11/19 1023 07/11/19 1030 07/11/19 1049  BP: (!) 119/57 (!) 111/56 130/65 (!) 146/55  Pulse: 100 (!) 102 99 (!) 102  Resp: (!) 25 (!) 25 (!) 25 17  Temp:   97.8 F (36.6 C) 98.9 F (37.2 C)  TempSrc:   Oral Oral  SpO2:  96%  96%  Weight:  93.5 kg    Height:        General: Pt is alert, awake, not in acute distress Cardiovascular: RRR, S1/S2 +, no rubs, no gallops Respiratory: Scattered rhonchi bilaterally, no wheezing, no rhonchi Abdominal: Soft, NT, ND, bowel sounds + Extremities: no edema, no cyanosis    The results of significant diagnostics from this hospitalization (including imaging, microbiology, ancillary and laboratory) are listed below for reference.     Microbiology: Recent Results (from the past 240 hour(s))  MRSA PCR Screening     Status: None   Collection Time: 07/10/19  3:05 AM   Specimen: Nasal Mucosa; Nasopharyngeal  Result Value Ref Range Status   MRSA by PCR NEGATIVE NEGATIVE Final    Comment:        The GeneXpert MRSA Assay (FDA approved for NASAL specimens only), is one component of a comprehensive MRSA colonization surveillance program. It is not intended to diagnose MRSA infection nor to guide or monitor  treatment for MRSA infections. Performed at [facility], [place] [address], [place], [place] [address]   SARS Coronavirus 2 by RT PCR (hospital order, performed in [facility] hospital lab) Nasopharyngeal Nasal Mucosa     Status: None   Collection Time: 07/10/19  3:05 AM   Specimen: Nasal Mucosa; Nasopharyngeal  Result Value Ref Range Status   SARS Coronavirus 2 NEGATIVE NEGATIVE Final    Comment: (NOTE) SARS-CoV-2 target nucleic acids are NOT DETECTED.  The SARS-CoV-2 RNA is generally detectable in upper and lower respiratory specimens during the acute phase of infection. The lowest concentration of SARS-CoV-2 viral  copies this assay can detect is 250 copies / mL. A negative result does not preclude SARS-CoV-2 infection and should not be used as the sole basis for treatment or other patient management decisions.  A negative result may occur with improper specimen collection / handling, submission of specimen other than nasopharyngeal swab, presence of viral mutation(s) within the areas targeted by this assay, and inadequate number of viral copies (<250 copies / mL). A negative result must be combined with clinical observations, patient history, and epidemiological information.  Fact Sheet for Patients:   StrictlyIdeas.no  Fact Sheet for Healthcare Providers: BankingDealers.co.za  This test is not yet approved or  cleared by the [place] FDA and has been authorized for detection and/or diagnosis of SARS-CoV-2 by FDA under an Emergency Use Authorization (EUA).  This EUA will remain in effect (meaning this test can be used) for the duration of the COVID-19 declaration under Section 564(b)(1) of the Act, 21 U.S.C. section 360bbb-3(b)(1), unless the authorization is terminated or revoked sooner.  Performed at [facility], [place] [address], [place], [place] [address]      Labs: BNP (last 3 results) Recent Labs     07/01/19 1006  BNP 916.3*   Basic Metabolic Panel: Recent Labs  Lab 07/10/19 0444 07/11/19 0536  NA 131* 133*  K 5.1 5.2*  CL 94* 96*  CO2 21* 21*  GLUCOSE 135* 191*  BUN 102* 64*  CREATININE 15.24* 11.74*  CALCIUM 8.4* 8.0*   Liver Function Tests: No results for input(s): AST, ALT, ALKPHOS, BILITOT, PROT, ALBUMIN in the last 168 hours. No results for input(s): LIPASE, AMYLASE in the last 168 hours. No results for input(s): AMMONIA in the last 168 hours. CBC: Recent Labs  Lab 07/10/19 0444 07/11/19 0536  WBC 13.9* 12.5*  NEUTROABS 11.6* 10.1*  HGB 10.3* 10.4*  HCT 31.7* 32.1*  MCV 88.8 90.2  PLT 148* 136*   Cardiac Enzymes: No results for input(s): CKTOTAL, CKMB, CKMBINDEX, TROPONINI in the last 168 hours. BNP: Invalid input(s): POCBNP CBG: Recent Labs  Lab 07/10/19 1815 07/10/19 2133 07/11/19 0049 07/11/19 0631 07/11/19 1058  GLUCAP 115* 120* 113* 176* 135*   D-Dimer No results for input(s): DDIMER in the last 72 hours. Hgb A1c Recent Labs    07/10/19 0444  HGBA1C 8.1*   Lipid Profile No results for input(s): CHOL, HDL, LDLCALC, TRIG, CHOLHDL, LDLDIRECT in the last 72 hours. Thyroid function studies No results for input(s): TSH, T4TOTAL, T3FREE, THYROIDAB in the last 72 hours.  Invalid input(s): FREET3 Anemia work up No results for input(s): VITAMINB12, FOLATE, FERRITIN, TIBC, IRON, RETICCTPCT in the last 72 hours. Urinalysis    Component Value Date/Time   COLORURINE yellow 09/28/2009 1317   APPEARANCEUR Clear 09/28/2009 1317   LABSPEC 1.010 09/28/2009 1317   PHURINE 5.0 09/28/2009 1317   HGBUR 2+ 09/28/2009 1317   BILIRUBINUR negative 03/17/2016 1648   PROTEINUR trace 03/17/2016 1648   UROBILINOGEN 0.2 03/17/2016 1648   UROBILINOGEN 0.2 09/28/2009 1317   NITRITE negative 03/17/2016 1648   NITRITE negative 09/28/2009 1317   LEUKOCYTESUR small (1+) (A) 03/17/2016 1648   Sepsis Labs Invalid input(s): PROCALCITONIN,  WBC,   LACTICIDVEN Microbiology Recent Results (from the past 240 hour(s))  MRSA PCR Screening     Status: None   Collection Time: 07/10/19  3:05 AM   Specimen: Nasal Mucosa; Nasopharyngeal  Result Value Ref Range Status   MRSA by PCR NEGATIVE NEGATIVE Final    Comment:  The GeneXpert MRSA Assay (FDA approved for NASAL specimens only), is one component of a comprehensive MRSA colonization surveillance program. It is not intended to diagnose MRSA infection nor to guide or monitor treatment for MRSA infections. Performed at [facility], [place] [address], [place], [place] [address]   SARS Coronavirus 2 by RT PCR (hospital order, performed in [facility] hospital lab) Nasopharyngeal Nasal Mucosa     Status: None   Collection Time: 07/10/19  3:05 AM   Specimen: Nasal Mucosa; Nasopharyngeal  Result Value Ref Range Status   SARS Coronavirus 2 NEGATIVE NEGATIVE Final    Comment: (NOTE) SARS-CoV-2 target nucleic acids are NOT DETECTED.  The SARS-CoV-2 RNA is generally detectable in upper and lower respiratory specimens during the acute phase of infection. The lowest concentration of SARS-CoV-2 viral copies this assay can detect is 250 copies / mL. A negative result does not preclude SARS-CoV-2 infection and should not be used as the sole basis for treatment or other patient management decisions.  A negative result may occur with improper specimen collection / handling, submission of specimen other than nasopharyngeal swab, presence of viral mutation(s) within the areas targeted by this assay, and inadequate number of viral copies (<250 copies / mL). A negative result must be combined with clinical observations, patient history, and epidemiological information.  Fact Sheet for Patients:   StrictlyIdeas.no  Fact Sheet for Healthcare Providers: BankingDealers.co.za  This test is not yet approved or  cleared by the [place] FDA  and has been authorized for detection and/or diagnosis of SARS-CoV-2 by FDA under an Emergency Use Authorization (EUA).  This EUA will remain in effect (meaning this test can be used) for the duration of the COVID-19 declaration under Section 564(b)(1) of the Act, 21 U.S.C. section 360bbb-3(b)(1), unless the authorization is terminated or revoked sooner.  Performed at [facility], [place] [address] [place],  [address]      Time coordinating discharge:  39 minutes  SIGNED:   Georgette Shell, MD  [facility] 07/11/2019, 3:28 PM  Password [email or web address]

## 2019-07-11 NOTE — Progress Notes (Signed)
Patient returned to unit form dialysis, vital signs are stable, no complaints at this time.

## 2019-07-12 ENCOUNTER — Telehealth: Payer: Self-pay | Admitting: *Deleted

## 2019-07-12 NOTE — Telephone Encounter (Signed)
Transition Care Management Follow-up Telephone Call   Date discharged? 07.01.2021   How have you been since you were released from the hospital? "A little weak but I'm alright"    Do you understand why you were in the hospital? yes   Do you understand the discharge instructions? yes   Where were you discharged to? Home    Items Reviewed:  Medications reviewed: yes  Allergies reviewed: yes  Dietary changes reviewed: N/A   Referrals reviewed: N/A    Functional Questionnaire:   Activities of Daily Living (ADLs):   He states they are independent in the following: ambulation, bathing and hygiene, feeding, continence, grooming, toileting and dressing States they require assistance with the following: N/A    Any transportation issues/concerns?: no   Any patient concerns? no   Confirmed importance and date/time of follow-up visits scheduled yes  Provider Appointment booked with Dr. Elease Hashimoto 07/19/2019 at 1:45PM   Confirmed with patient if condition begins to worsen call PCP or go to the ER.  Patient was given the office number and encouraged to call back with question or concerns.  : yes

## 2019-07-13 DIAGNOSIS — Z992 Dependence on renal dialysis: Secondary | ICD-10-CM | POA: Diagnosis not present

## 2019-07-13 DIAGNOSIS — N2581 Secondary hyperparathyroidism of renal origin: Secondary | ICD-10-CM | POA: Diagnosis not present

## 2019-07-13 DIAGNOSIS — N186 End stage renal disease: Secondary | ICD-10-CM | POA: Diagnosis not present

## 2019-07-13 DIAGNOSIS — D631 Anemia in chronic kidney disease: Secondary | ICD-10-CM | POA: Diagnosis not present

## 2019-07-13 DIAGNOSIS — D509 Iron deficiency anemia, unspecified: Secondary | ICD-10-CM | POA: Diagnosis not present

## 2019-07-16 DIAGNOSIS — D509 Iron deficiency anemia, unspecified: Secondary | ICD-10-CM | POA: Diagnosis not present

## 2019-07-16 DIAGNOSIS — N2581 Secondary hyperparathyroidism of renal origin: Secondary | ICD-10-CM | POA: Diagnosis not present

## 2019-07-16 DIAGNOSIS — D631 Anemia in chronic kidney disease: Secondary | ICD-10-CM | POA: Diagnosis not present

## 2019-07-16 DIAGNOSIS — Z992 Dependence on renal dialysis: Secondary | ICD-10-CM | POA: Diagnosis not present

## 2019-07-16 DIAGNOSIS — N186 End stage renal disease: Secondary | ICD-10-CM | POA: Diagnosis not present

## 2019-07-18 DIAGNOSIS — N186 End stage renal disease: Secondary | ICD-10-CM | POA: Diagnosis not present

## 2019-07-18 DIAGNOSIS — N2581 Secondary hyperparathyroidism of renal origin: Secondary | ICD-10-CM | POA: Diagnosis not present

## 2019-07-18 DIAGNOSIS — D631 Anemia in chronic kidney disease: Secondary | ICD-10-CM | POA: Diagnosis not present

## 2019-07-18 DIAGNOSIS — D509 Iron deficiency anemia, unspecified: Secondary | ICD-10-CM | POA: Diagnosis not present

## 2019-07-18 DIAGNOSIS — Z992 Dependence on renal dialysis: Secondary | ICD-10-CM | POA: Diagnosis not present

## 2019-07-19 ENCOUNTER — Ambulatory Visit (INDEPENDENT_AMBULATORY_CARE_PROVIDER_SITE_OTHER): Payer: Medicare Other | Admitting: Family Medicine

## 2019-07-19 ENCOUNTER — Other Ambulatory Visit: Payer: Self-pay

## 2019-07-19 ENCOUNTER — Encounter: Payer: Self-pay | Admitting: Family Medicine

## 2019-07-19 VITALS — BP 146/70 | HR 71 | Temp 99.2°F | Wt 212.9 lb

## 2019-07-19 DIAGNOSIS — N186 End stage renal disease: Secondary | ICD-10-CM

## 2019-07-19 DIAGNOSIS — J9601 Acute respiratory failure with hypoxia: Secondary | ICD-10-CM | POA: Diagnosis not present

## 2019-07-19 DIAGNOSIS — Z992 Dependence on renal dialysis: Secondary | ICD-10-CM | POA: Diagnosis not present

## 2019-07-19 DIAGNOSIS — E118 Type 2 diabetes mellitus with unspecified complications: Secondary | ICD-10-CM | POA: Diagnosis not present

## 2019-07-19 DIAGNOSIS — Z89511 Acquired absence of right leg below knee: Secondary | ICD-10-CM

## 2019-07-19 NOTE — Progress Notes (Signed)
Established Patient Office Visit  Subjective:  Patient ID: Anthony Bullock, male    DOB: 1960/11/09  Age: 59 y.o. MRN: [id number]  CC:  Chief Complaint  Patient presents with  . Hospitalization Follow-up    pt had shortness of breathe and was in hospital for about 4 days states he is feeling better now     HPI Anthony Bullock presents for hospital follow-up.  He has history of CAD, cardiomyopathy, type 2 diabetes on insulin, end-stage renal disease on hemodialysis, COPD who presented with increasing shortness of breath.  He also had some productive cough for 1 week prior to his admission.  He had been on antibiotics for 1 week prior to admission.  Acute worsening on the 28th.  Initially went to [facility] and subsequently transferred to [facility].  He states he has some chronic leg swelling.  He gets hemodialysis every Tuesday, Thursday, and Saturday.  He was volume overloaded.  His blood pressure was up on admission- but improved since then.  Initial O2 sats were in the low 70s.  His chest x-ray showed patchy airspace disease bilaterally mid and lower lungs with concern for possible pneumonia.  White count 15,000.  Lactic acid normal.  He was placed on BiPAP in the ED.  Blood cultures came back negative.  Treated with broad-spectrum antibiotics.  Diagnosis was acute respiratory failure with suspected pulmonary edema and community-acquired pneumonia. Patient was discharged on Augmentin.  He is doing much better at this time and feels near baseline.  Pulse oximetry 94%.  Recent A1c 8.1%.  Denies recent hypoglycemia.  Discharge white count 12.5 thousand with hemoglobin 10.4.  He states his dry weight (goal for dialysis) was reduced from 95.5 kg to 92.5.  He has follow-up hemodialysis tomorrow.  Past Medical History:  Diagnosis Date  . 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 Allergic rhinitis   . Anemia   . Arthritis   . Chronic systolic heart failure  ([place])    a. ECHO (12/2012) EF 25-30%, HK entireanteroseptal myocardium //  b.  EF 25%, diffuse HK, grade 1 diastolic dysfunction, MAC, mild LAE, normal RVSF, trivial pericardial effusion  . COPD (chronic obstructive pulmonary disease) ([place])   . Diabetes mellitus type II   . Diabetic nephropathy ([place])   . Diabetic neuropathy ([place])   . ESRD on hemodialysis [facility])    started HD June 2017, goes to [facility] HD unit, Dr Hinda Lenis  . History of cardiac catheterization    a.Myoview 1/15:  There is significant left ventricular dysfunction. There may be slight scar at the apex. There is no significant ischemia. LV Ejection Fraction: 27%  //  b. RHC/LHC (1/15) with mean RA 6, PA 47/22 mean 33, mean PCWP 20, PVR 2.5 WU, CI 2.5; 80% dLAD stenosis, 70% diffuse large D.   . History of kidney stones   . Hyperlipidemia   . Hypertension   . Kidney stones   . NICM (nonischemic cardiomyopathy) ([place])    Primarily nonischemic. Echo (12/14) with EF 25-30%. Echo (3/15) with EF 25%, mild to moderately dilated LV, normal RV size and systolic function.   . Pneumonia   . Urethral stricture     Past Surgical History:  Procedure Laterality Date  . ABDOMINAL AORTOGRAM W/LOWER EXTREMITY N/A 03/30/2016   Procedure: Abdominal Aortogram w/Lower Extremity;  Surgeon: Angelia Mould, MD;  Location: [place] [facility];  Service: Cardiovascular;  Laterality: N/A;  . AMPUTATION Right 04/26/2016  Procedure: Right Below Knee Amputation;  Surgeon: Newt Minion, MD;  Location: [place];  Service: Orthopedics;  Laterality: Right;  . AV FISTULA PLACEMENT Right 09/08/2015   Procedure: INSERTION OF 4-32m x 45cm  ARTERIOVENOUS (AV) GORE-TEX GRAFT RIGHT UPPER  ARM;  Surgeon: CAngelia Mould MD;  [place]  Service: Vascular;  Laterality: Right;  . AV FISTULA PLACEMENT Left 01/14/2016   Procedure: CREATION OF LEFT UPPER ARM ARTERIOVENOUS FISTULA;  Surgeon: CAngelia Mould MD;  [place]  Service:  Vascular;  Laterality: Left;  . BASCILIC VEIN TRANSPOSITION Right 08/22/2014   Procedure: RIGHT UPPER ARM BASCILIC VEIN TRANSPOSITION;  Surgeon: CAngelia Mould MD;  [place]  Service: Vascular;  Laterality: Right;  . BELOW KNEE LEG AMPUTATION Right 04/26/2016  . CARDIAC CATHETERIZATION    . CARDIAC DEFIBRILLATOR PLACEMENT  06/27/2013   Sub Q       BY DR KCaryl Comes . CATARACT EXTRACTION W/PHACO Right 08/06/2018   Procedure: CATARACT EXTRACTION PHACO AND INTRAOCULAR LENS PLACEMENT (IOC);  Surgeon: WBaruch Goldmann MD;  Location: [facility];  Service: Ophthalmology;  Laterality: Right;  CDE: 4.06  . CATARACT EXTRACTION W/PHACO Left 08/20/2018   Procedure: CATARACT EXTRACTION PHACO AND INTRAOCULAR LENS PLACEMENT (IOC);  Surgeon: WBaruch Goldmann MD;  Location: [facility];  Service: Ophthalmology;  Laterality: Left;  CDE: 6.76  . COLONOSCOPY WITH PROPOFOL N/A 07/22/2015   Procedure: COLONOSCOPY WITH PROPOFOL;  Surgeon: HDoran Stabler MD;  [facility];  Service: Gastroenterology;  Laterality: N/A;  . FEMORAL-POPLITEAL BYPASS GRAFT Right 03/31/2016   Procedure: BYPASS GRAFT FEMORAL-POPLITEAL ARTERY USING RIGHT GREATER SAPHENOUS NONREVERSED VEIN;  Surgeon: CAngelia Mould MD;  [place]  Service: Vascular;  Laterality: Right;  . HERNIA REPAIR    . I & D EXTREMITY Right 03/31/2016   Procedure: IRRIGATION AND DEBRIDEMENT FOOT;  Surgeon: CAngelia Mould MD;  [place]  Service: Vascular;  Laterality: Right;  . IMPLANTABLE CARDIOVERTER DEFIBRILLATOR IMPLANT N/A 06/27/2013   Procedure: SUB Q ICD;  Surgeon: SDeboraha Sprang MD;  [facility];  Service: Cardiovascular;  Laterality: N/A;  . INTRAOPERATIVE ARTERIOGRAM Right 03/31/2016   Procedure: INTRA OPERATIVE ARTERIOGRAM;  Surgeon: CAngelia Mould MD;  [place]  Service: Vascular;  Laterality: Right;  . IR GENERIC HISTORICAL Right 11/30/2015   IR THROMBECTOMY AV FISTULA W/THROMBOLYSIS/PTA  INC/SHUNT/IMG RIGHT 11/30/2015 GAletta Edouard MD [facility]  . IR GENERIC HISTORICAL  11/30/2015   IR [place] ACCESS RIGHT 11/30/2015 GAletta Edouard MD [facility]  . IR GENERIC HISTORICAL Right 12/15/2015   IR THROMBECTOMY AV FISTULA W/THROMBOLYSIS/PTA/STENT INC/SHUNT/IMG RT 12/15/2015 DArne Cleveland MD [facility]  . IR GENERIC HISTORICAL  12/15/2015   IR [place] VASC ACCESS RIGHT 12/15/2015 DArne Cleveland MD [facility]  . IR GENERIC HISTORICAL  12/28/2015   IR FLUORO GUIDE CV LINE RIGHT 12/28/2015 AMarybelle Killings MD [facility]  . IR GENERIC HISTORICAL  12/28/2015   IR [place] VASC ACCESS RIGHT 12/28/2015 AMarybelle Killings MD [facility]  . LEFT A ND RIGHT HEART CATH  01/30/2013   DR BSung Amabile . LEFT AND RIGHT HEART CATHETERIZATION WITH CORONARY ANGIOGRAM N/A 01/30/2013   Procedure: LEFT AND RIGHT HEART CATHETERIZATION WITH CORONARY ANGIOGRAM;  Surgeon: DJolaine Artist MD;  [facility];  Service: Cardiovascular;  Laterality: N/A;  . PERIPHERAL VASCULAR CATHETERIZATION Right 01/26/2015   Procedure: A/V Fistulagram;  Surgeon: CAngelia Mould MD;  [place]  [facility];  Service: Cardiovascular;  Laterality: Right;  . reapea urethral surgery for recurrent obstruction  2011  . TOTAL KNEE ARTHROPLASTY Right 2007  . VEIN HARVEST Right 03/31/2016   Procedure: RIGHT GREATER SAPHENOUS VEIN HARVEST;  Surgeon: Angelia Mould, MD;  Location[facility];  Service: Vascular;  Laterality: Right;    Family History  Problem Relation Age of Onset  . Bladder Cancer Mother   . Alcohol abuse Father   . Melanoma Father   . Stroke Maternal Grandmother   . Heart Problems Maternal Grandmother        unknown  . Diabetes Maternal Grandmother   . Heart disease Maternal Grandfather   . Prostate cancer Maternal Grandfather     Social History   Socioeconomic History  . Marital status: Married    Spouse name: Not on file  . Number of children: 0  . Years of  education: Not on file  . Highest education level: Not on file  Occupational History  . Not on file  Tobacco Use  . Smoking status: Former Smoker    Packs/day: 2.00    Years: 32.00    Pack years: 64.00    Types: Cigarettes    Quit date: 05/11/2010    Years since quitting: 9.1  . Smokeless tobacco: Never Used  Vaping Use  . Vaping Use: Never used  Substance and Sexual Activity  . Alcohol use: No  . Drug use: No  . Sexual activity: Yes  Other Topics Concern  . 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 Difficulty of Paying Living Expenses:   Food Insecurity:   . Worried About Charity fundraiser in the Last Year:   . Arboriculturist in the Last Year:   Transportation Needs:   . Film/video editor (Medical):   [name] Lack of Transportation (Non-Medical):   Physical Activity:   . Days of Exercise per Week:   . Minutes of Exercise per Session:   Stress:   . Feeling of Stress :   Social Connections:   . Frequency of Communication with Friends and Family:   . Frequency of Social Gatherings with Friends and Family:   . Attends Religious Services:   . Active Member of Clubs or Organizations:   . Attends Archivist Meetings:   [name] Marital Status:   Intimate Partner Violence:   . Fear of Current or Ex-Partner:   . Emotionally Abused:   [name] Physically Abused:   . Sexually Abused:     Outpatient Medications Prior to Visit  Medication Sig Dispense Refill  . albuterol (PROVENTIL HFA;VENTOLIN HFA) 108 (90 Base) MCG/ACT inhaler Inhale 2 puffs into the lungs every 4 (four) hours as needed for wheezing or shortness of breath. 1 Inhaler 1  . aspirin EC 81 MG tablet Take 81 mg by mouth daily.     [name] atorvastatin (LIPITOR) 80 MG tablet Take 1 tablet (80 mg total) by mouth at bedtime. 90 tablet 3  . azelastine (ASTELIN) 0.1 % nasal spray Place 2 sprays into both nostrils 3 (three) times  daily as needed for rhinitis. Use in each nostril as directed 30 mL 12  . carvedilol (COREG) 12.5 MG tablet Take 1 tablet (12.5 mg total) by mouth 2 (two) times daily with a meal. 180 tablet 3  . Continuous Blood Gluc Sensor (FREESTYLE LIBRE 14  DAY SENSOR) MISC Inject 1 Device into the skin every 14 (fourteen) days. 2 each 3  . fenofibrate 160 MG tablet Take 1 tablet (160 mg total) by mouth daily. 90 tablet 3  . fexofenadine (ALLEGRA) 180 MG tablet Take 180 mg by mouth daily.    . furosemide (LASIX) 40 MG tablet Take 3 tablets by mouth once daily 270 tablet 0  . gabapentin (NEURONTIN) 300 MG capsule Take 2 capsules (600 mg total) by mouth 2 (two) times daily. 360 capsule 3  . glucose blood test strip Check 1 time daily. E11.9 One Touch Ultra Blue Test Strips 100 each 3  . Insulin Glargine (BASAGLAR KWIKPEN) 100 UNIT/ML INJECT 70 UNITS SUBCUTANEOUSLY AT BEDTIME 15 mL 11  . Insulin Lispro (HUMALOG KWIKPEN) 200 UNIT/ML SOPN Inject 10 Units into the skin 3 (three) times daily with meals. Use tid with meals as per sliding scale 4 pen 6  . Insulin Pen Needle (BD PEN NEEDLE NANO U/F) 32G X 4 MM MISC USE 1 PEN NEEDLE SUBCUTANEOUSLY WITH INSULIN 4 TIMES DAILY 400 each 0  . lanthanum (FOSRENOL) 1000 MG chewable tablet Chew 1,000 mg by mouth 3 (three) times daily with meals. Take one tablet after each meal.     . multivitamin (RENA-VIT) TABS tablet Take 1 tablet by mouth daily. 90 tablet 3  . Olopatadine HCl (PATADAY) 0.2 % SOLN Place 1 drop into both eyes daily as needed (for allergies).      No facility-administered medications prior to visit.    Allergies  Allergen Reactions  . Morphine Sulfate Rash and Other (See Comments)    Itches all over, red spots    ROS Review of Systems  Constitutional: Negative for chills and fever.  Respiratory: Negative for cough and shortness of breath.   Cardiovascular: Negative for chest pain and palpitations.  Gastrointestinal: Negative for abdominal pain.    Genitourinary: Negative for dysuria.      Objective:    Physical Exam Vitals reviewed.  Constitutional:      Appearance: Normal appearance.  Cardiovascular:     Rate and Rhythm: Normal rate and regular rhythm.  Pulmonary:     Effort: Pulmonary effort is normal.     Breath sounds: Normal breath sounds.  Musculoskeletal:     Comments: He has trace pitting edema left leg  Neurological:     Mental Status: He is alert.     BP (!) 146/70 (BP Location: Left Arm, Patient Position: Sitting, Cuff Size: Normal)   Pulse 71   Temp 99.2 F (37.3 C) (Oral)   Wt 212 lb 14.4 oz (96.6 kg)   SpO2 94%   BMI 30.55 kg/m  Wt Readings from Last 3 Encounters:  07/19/19 212 lb 14.4 oz (96.6 kg)  07/11/19 206 lb 2.1 oz (93.5 kg)  07/01/19 222 lb 0.1 oz (100.7 kg)     Health Maintenance Due  Topic Date Due  . OPHTHALMOLOGY EXAM  03/21/2019  . COVID-19 Vaccine (2 - Moderna 2-dose series) 05/16/2019    There are no preventive care reminders to display for this patient.  Lab Results  Component Value Date   TSH 2.41 09/29/2014   Lab Results  Component Value Date   WBC 12.5 (H) 07/11/2019   HGB 10.4 (L) 07/11/2019   HCT 32.1 (L) 07/11/2019   MCV 90.2 07/11/2019   PLT 136 (L) 07/11/2019   Lab Results  Component Value Date   NA 133 (L) 07/11/2019   K 5.2 (H) 07/11/2019   CO2  21 (L) 07/11/2019   GLUCOSE 191 (H) 07/11/2019   BUN 64 (H) 07/11/2019   CREATININE 11.74 (H) 07/11/2019   BILITOT 0.5 07/01/2019   ALKPHOS 98 07/01/2019   AST 28 07/01/2019   ALT 39 07/01/2019   PROT 7.0 07/01/2019   ALBUMIN 3.5 07/01/2019   CALCIUM 8.0 (L) 07/11/2019   ANIONGAP 16 (H) 07/11/2019   GFR 12.30 (LL) 06/27/2014   Lab Results  Component Value Date   CHOL 158 05/08/2019   Lab Results  Component Value Date   HDL 37.00 (L) 05/08/2019   Lab Results  Component Value Date   LDLCALC 84 05/08/2019   Lab Results  Component Value Date   TRIG 187.0 (H) 05/08/2019   Lab Results   Component Value Date   CHOLHDL 4 05/08/2019   Lab Results  Component Value Date   HGBA1C 8.1 (H) 07/10/2019      Assessment & Plan:   #1 recent acute respiratory failure with pulmonary edema and probable [facility] pneumonia clinically improved.  His dry weight for hemodialysis has been changed as above.  He is very stable at this time  #2 end-stage renal disease on hemodialysis.  Continue close follow-up with nephrology  #3 type 2 diabetes dependent on insulin with recent A1c 8.1% which for him is very good.  He remains on Basaglar and sliding scale Humalog is very happy with this combination.  No recent hypoglycemia. -Recommend 20-monthfollow-up and recheck A1c at that time  #4 hx of right BKA.    No orders of the defined types were placed in this encounter.   Follow-up: Return in about 4 months (around 11/19/2019).    BCarolann Littler MD

## 2019-07-20 DIAGNOSIS — N2581 Secondary hyperparathyroidism of renal origin: Secondary | ICD-10-CM | POA: Diagnosis not present

## 2019-07-20 DIAGNOSIS — Z992 Dependence on renal dialysis: Secondary | ICD-10-CM | POA: Diagnosis not present

## 2019-07-20 DIAGNOSIS — D631 Anemia in chronic kidney disease: Secondary | ICD-10-CM | POA: Diagnosis not present

## 2019-07-20 DIAGNOSIS — D509 Iron deficiency anemia, unspecified: Secondary | ICD-10-CM | POA: Diagnosis not present

## 2019-07-20 DIAGNOSIS — N186 End stage renal disease: Secondary | ICD-10-CM | POA: Diagnosis not present

## 2019-07-23 DIAGNOSIS — D509 Iron deficiency anemia, unspecified: Secondary | ICD-10-CM | POA: Diagnosis not present

## 2019-07-23 DIAGNOSIS — Z992 Dependence on renal dialysis: Secondary | ICD-10-CM | POA: Diagnosis not present

## 2019-07-23 DIAGNOSIS — N186 End stage renal disease: Secondary | ICD-10-CM | POA: Diagnosis not present

## 2019-07-23 DIAGNOSIS — D631 Anemia in chronic kidney disease: Secondary | ICD-10-CM | POA: Diagnosis not present

## 2019-07-23 DIAGNOSIS — N2581 Secondary hyperparathyroidism of renal origin: Secondary | ICD-10-CM | POA: Diagnosis not present

## 2019-07-24 ENCOUNTER — Other Ambulatory Visit: Payer: Self-pay | Admitting: Family Medicine

## 2019-07-24 ENCOUNTER — Other Ambulatory Visit: Payer: Self-pay | Admitting: Physician Assistant

## 2019-07-25 DIAGNOSIS — N2581 Secondary hyperparathyroidism of renal origin: Secondary | ICD-10-CM | POA: Diagnosis not present

## 2019-07-25 DIAGNOSIS — D631 Anemia in chronic kidney disease: Secondary | ICD-10-CM | POA: Diagnosis not present

## 2019-07-25 DIAGNOSIS — N186 End stage renal disease: Secondary | ICD-10-CM | POA: Diagnosis not present

## 2019-07-25 DIAGNOSIS — Z992 Dependence on renal dialysis: Secondary | ICD-10-CM | POA: Diagnosis not present

## 2019-07-25 DIAGNOSIS — D509 Iron deficiency anemia, unspecified: Secondary | ICD-10-CM | POA: Diagnosis not present

## 2019-07-27 DIAGNOSIS — D509 Iron deficiency anemia, unspecified: Secondary | ICD-10-CM | POA: Diagnosis not present

## 2019-07-27 DIAGNOSIS — N186 End stage renal disease: Secondary | ICD-10-CM | POA: Diagnosis not present

## 2019-07-27 DIAGNOSIS — Z992 Dependence on renal dialysis: Secondary | ICD-10-CM | POA: Diagnosis not present

## 2019-07-27 DIAGNOSIS — N2581 Secondary hyperparathyroidism of renal origin: Secondary | ICD-10-CM | POA: Diagnosis not present

## 2019-07-27 DIAGNOSIS — D631 Anemia in chronic kidney disease: Secondary | ICD-10-CM | POA: Diagnosis not present

## 2019-07-30 ENCOUNTER — Other Ambulatory Visit: Payer: Self-pay | Admitting: Family Medicine

## 2019-07-30 DIAGNOSIS — N186 End stage renal disease: Secondary | ICD-10-CM | POA: Diagnosis not present

## 2019-07-30 DIAGNOSIS — D509 Iron deficiency anemia, unspecified: Secondary | ICD-10-CM | POA: Diagnosis not present

## 2019-07-30 DIAGNOSIS — D631 Anemia in chronic kidney disease: Secondary | ICD-10-CM | POA: Diagnosis not present

## 2019-07-30 DIAGNOSIS — Z992 Dependence on renal dialysis: Secondary | ICD-10-CM | POA: Diagnosis not present

## 2019-07-30 DIAGNOSIS — N2581 Secondary hyperparathyroidism of renal origin: Secondary | ICD-10-CM | POA: Diagnosis not present

## 2019-08-01 ENCOUNTER — Other Ambulatory Visit: Payer: Self-pay | Admitting: Family Medicine

## 2019-08-01 DIAGNOSIS — Z992 Dependence on renal dialysis: Secondary | ICD-10-CM | POA: Diagnosis not present

## 2019-08-01 DIAGNOSIS — D509 Iron deficiency anemia, unspecified: Secondary | ICD-10-CM | POA: Diagnosis not present

## 2019-08-01 DIAGNOSIS — N2581 Secondary hyperparathyroidism of renal origin: Secondary | ICD-10-CM | POA: Diagnosis not present

## 2019-08-01 DIAGNOSIS — D631 Anemia in chronic kidney disease: Secondary | ICD-10-CM | POA: Diagnosis not present

## 2019-08-01 DIAGNOSIS — N186 End stage renal disease: Secondary | ICD-10-CM | POA: Diagnosis not present

## 2019-08-03 DIAGNOSIS — Z992 Dependence on renal dialysis: Secondary | ICD-10-CM | POA: Diagnosis not present

## 2019-08-03 DIAGNOSIS — N186 End stage renal disease: Secondary | ICD-10-CM | POA: Diagnosis not present

## 2019-08-03 DIAGNOSIS — N2581 Secondary hyperparathyroidism of renal origin: Secondary | ICD-10-CM | POA: Diagnosis not present

## 2019-08-03 DIAGNOSIS — D631 Anemia in chronic kidney disease: Secondary | ICD-10-CM | POA: Diagnosis not present

## 2019-08-03 DIAGNOSIS — D509 Iron deficiency anemia, unspecified: Secondary | ICD-10-CM | POA: Diagnosis not present

## 2019-08-06 DIAGNOSIS — N186 End stage renal disease: Secondary | ICD-10-CM | POA: Diagnosis not present

## 2019-08-06 DIAGNOSIS — D509 Iron deficiency anemia, unspecified: Secondary | ICD-10-CM | POA: Diagnosis not present

## 2019-08-06 DIAGNOSIS — N2581 Secondary hyperparathyroidism of renal origin: Secondary | ICD-10-CM | POA: Diagnosis not present

## 2019-08-06 DIAGNOSIS — Z992 Dependence on renal dialysis: Secondary | ICD-10-CM | POA: Diagnosis not present

## 2019-08-06 DIAGNOSIS — D631 Anemia in chronic kidney disease: Secondary | ICD-10-CM | POA: Diagnosis not present

## 2019-08-08 DIAGNOSIS — Z992 Dependence on renal dialysis: Secondary | ICD-10-CM | POA: Diagnosis not present

## 2019-08-08 DIAGNOSIS — D631 Anemia in chronic kidney disease: Secondary | ICD-10-CM | POA: Diagnosis not present

## 2019-08-08 DIAGNOSIS — N186 End stage renal disease: Secondary | ICD-10-CM | POA: Diagnosis not present

## 2019-08-08 DIAGNOSIS — N2581 Secondary hyperparathyroidism of renal origin: Secondary | ICD-10-CM | POA: Diagnosis not present

## 2019-08-08 DIAGNOSIS — D509 Iron deficiency anemia, unspecified: Secondary | ICD-10-CM | POA: Diagnosis not present

## 2019-08-09 ENCOUNTER — Ambulatory Visit (INDEPENDENT_AMBULATORY_CARE_PROVIDER_SITE_OTHER): Payer: Medicare Other | Admitting: Cardiology

## 2019-08-09 ENCOUNTER — Encounter: Payer: Self-pay | Admitting: Cardiology

## 2019-08-09 VITALS — BP 132/60 | HR 78 | Ht 70.0 in | Wt 213.0 lb

## 2019-08-09 DIAGNOSIS — I10 Essential (primary) hypertension: Secondary | ICD-10-CM | POA: Diagnosis not present

## 2019-08-09 DIAGNOSIS — I5022 Chronic systolic (congestive) heart failure: Secondary | ICD-10-CM

## 2019-08-09 DIAGNOSIS — E782 Mixed hyperlipidemia: Secondary | ICD-10-CM | POA: Diagnosis not present

## 2019-08-09 DIAGNOSIS — I2510 Atherosclerotic heart disease of native coronary artery without angina pectoris: Secondary | ICD-10-CM

## 2019-08-09 NOTE — Progress Notes (Signed)
Clinical Summary [name] is a 59 y.o.male seen today for follow up of the following medical problems.    1. Chronic systolic HF/NICM - echo in 2015 LVEF 25%. Most recent echo 08/2015 LVEF has improved to 50-55%. - has had some of his CHF meds decreased due to low bp's on HD.  - has ICD  08/2016 echo LVEF 55%. , grade I diasotlic function.   06/2019 [facility] ER visit. +cough and SOB - by EMS sats 70%, bp 200/100 - placed on cpap - transferred to [facility], treated for pneumonia and pulm edema, had extra HD sessions - no recurrent symptoms since discharge.  - from f/u notes appears dry weight at HD has been decreasd to 92.5 kg     2. ESRD - being considered for transplant at [facility] - still on HD. Ongoing issues with low bp's. Takes coreg after HD. Goes Tues Thr Sat - CXR Progressive patchy airspace disease in both mid lower lung zones. Increased peribronchial thickening. No pneumothorax. No large pleural effusion. Stable osseous structures.  -transferred for [place]  - treated for pneumonia and pulm edema during admission. Abx, had HD during admission - occasional low bp's during HD   3. HTN - can have occasional low bp's during HD - compliant with meds  4. CAD - cath 2015 with diffuse nonobstructive disease, worst lesion 80% small distal LAD.   - no recent chest pains   5. Hyperlipidemia - Jan 2019 TC 103 TG 136 HDL 38 LDL 38 04/2019 TG 187 HDL 37 LDL 84    6. DM2 - 06/2019 HgbA1c 8.1 - followed by pcp  7. Carotid stenosis - followed by vascular. - in 2019 mild bilateral disease   Past Medical History:  Diagnosis Date  . 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 Allergic rhinitis   . Anemia   . Arthritis   . Chronic systolic heart failure ([place])    a. ECHO (12/2012) EF 25-30%, HK entireanteroseptal myocardium //  b.  EF 25%, diffuse HK, grade 1 diastolic dysfunction, MAC, mild LAE, normal RVSF,  trivial pericardial effusion  . COPD (chronic obstructive pulmonary disease) ([place])   . Diabetes mellitus type II   . Diabetic nephropathy ([place])   . Diabetic neuropathy ([place])   . ESRD on hemodialysis [facility])    started HD June 2017, goes to [facility] HD unit, Dr Hinda Lenis  . History of cardiac catheterization    a.Myoview 1/15:  There is significant left ventricular dysfunction. There may be slight scar at the apex. There is no significant ischemia. LV Ejection Fraction: 27%  //  b. RHC/LHC (1/15) with mean RA 6, PA 47/22 mean 33, mean PCWP 20, PVR 2.5 WU, CI 2.5; 80% dLAD stenosis, 70% diffuse large D.   . History of kidney stones   . Hyperlipidemia   . Hypertension   . Kidney stones   . NICM (nonischemic cardiomyopathy) ([place])    Primarily nonischemic. Echo (12/14) with EF 25-30%. Echo (3/15) with EF 25%, mild to moderately dilated LV, normal RV size and systolic function.   . Pneumonia   . Urethral stricture      Allergies  Allergen Reactions  . Epoetin Alfa   . Ferumoxytol   . Morphine Sulfate Rash and Other (See Comments)    Itches all over, red spots     Current Outpatient Medications  Medication Sig Dispense Refill  . albuterol (PROVENTIL HFA;VENTOLIN HFA) 108 (90  Base) MCG/ACT inhaler Inhale 2 puffs into the lungs every 4 (four) hours as needed for wheezing or shortness of breath. 1 Inhaler 1  . aspirin EC 81 MG tablet Take 81 mg by mouth daily.     [name] atorvastatin (LIPITOR) 80 MG tablet Take 1 tablet (80 mg total) by mouth at bedtime. 90 tablet 3  . azelastine (ASTELIN) 0.1 % nasal spray Place 2 sprays into both nostrils 3 (three) times daily as needed for rhinitis. Use in each nostril as directed 30 mL 12  . carvedilol (COREG) 12.5 MG tablet Take 1 tablet (12.5 mg total) by mouth 2 (two) times daily with a meal. 180 tablet 3  . Continuous Blood Gluc Sensor (FREESTYLE LIBRE 14 DAY SENSOR) MISC USE AS DIRECTED EVERY 14 DAYS 2 each 0  . fenofibrate 160 MG tablet Take 1  tablet (160 mg total) by mouth daily. 90 tablet 3  . fexofenadine (ALLEGRA) 180 MG tablet Take 180 mg by mouth daily.    . furosemide (LASIX) 40 MG tablet Take 3 tablets by mouth once daily 270 tablet 0  . gabapentin (NEURONTIN) 300 MG capsule Take 2 capsules (600 mg total) by mouth 2 (two) times daily. 360 capsule 3  . glucose blood test strip Check 1 time daily. E11.9 One Touch Ultra Blue Test Strips 100 each 3  . Insulin Glargine (BASAGLAR KWIKPEN) 100 UNIT/ML INJECT 70 UNITS SUBCUTANEOUSLY AT BEDTIME 15 mL 11  . Insulin Lispro (HUMALOG KWIKPEN) 200 UNIT/ML SOPN Inject 10 Units into the skin 3 (three) times daily with meals. Use tid with meals as per sliding scale 4 pen 6  . Insulin Pen Needle (BD PEN NEEDLE NANO U/F) 32G X 4 MM MISC USE 1 PEN NEEDLE SUBCUTANEOUSLY WITH INSULIN 4 TIMES DAILY 400 each 0  . lanthanum (FOSRENOL) 1000 MG chewable tablet Chew 1,000 mg by mouth 3 (three) times daily with meals. Take one tablet after each meal.     . multivitamin (RENA-VIT) TABS tablet Take 1 tablet by mouth daily. 90 tablet 3  . Olopatadine HCl (PATADAY) 0.2 % SOLN Place 1 drop into both eyes daily as needed (for allergies).      No current facility-administered medications for this visit.     Past Surgical History:  Procedure Laterality Date  . ABDOMINAL AORTOGRAM W/LOWER EXTREMITY N/A 03/30/2016   Procedure: Abdominal Aortogram w/Lower Extremity;  Surgeon: Angelia Mould, MD;  Location: [place] [facility];  Service: Cardiovascular;  Laterality: N/A;  . AMPUTATION Right 04/26/2016   Procedure: Right Below Knee Amputation;  Surgeon: Newt Minion, MD;  Location: [place];  Service: Orthopedics;  Laterality: Right;  . AV FISTULA PLACEMENT Right 09/08/2015   Procedure: INSERTION OF 4-95m x 45cm  ARTERIOVENOUS (AV) GORE-TEX GRAFT RIGHT UPPER  ARM;  Surgeon: CAngelia Mould MD;  [place]  Service: Vascular;  Laterality: Right;  . AV FISTULA PLACEMENT Left 01/14/2016   Procedure:  CREATION OF LEFT UPPER ARM ARTERIOVENOUS FISTULA;  Surgeon: CAngelia Mould MD;  [place]  Service: Vascular;  Laterality: Left;  . BASCILIC VEIN TRANSPOSITION Right 08/22/2014   Procedure: RIGHT UPPER ARM BASCILIC VEIN TRANSPOSITION;  Surgeon: CAngelia Mould MD;  [place]  Service: Vascular;  Laterality: Right;  . BELOW KNEE LEG AMPUTATION Right 04/26/2016  . CARDIAC CATHETERIZATION    . CARDIAC DEFIBRILLATOR PLACEMENT  06/27/2013   Sub Q       BY DR KCaryl Comes . CATARACT EXTRACTION W/PHACO Right 08/06/2018  Procedure: CATARACT EXTRACTION PHACO AND INTRAOCULAR LENS PLACEMENT (IOC);  Surgeon: Baruch Goldmann, MD;  Location: [facility];  Service: Ophthalmology;  Laterality: Right;  CDE: 4.06  . CATARACT EXTRACTION W/PHACO Left 08/20/2018   Procedure: CATARACT EXTRACTION PHACO AND INTRAOCULAR LENS PLACEMENT (IOC);  Surgeon: Baruch Goldmann, MD;  Location: [facility];  Service: Ophthalmology;  Laterality: Left;  CDE: 6.76  . COLONOSCOPY WITH PROPOFOL N/A 07/22/2015   Procedure: COLONOSCOPY WITH PROPOFOL;  Surgeon: Doran Stabler, MD;  Location: [facility];  Service: Gastroenterology;  Laterality: N/A;  . FEMORAL-POPLITEAL BYPASS GRAFT Right 03/31/2016   Procedure: BYPASS GRAFT FEMORAL-POPLITEAL ARTERY USING RIGHT GREATER SAPHENOUS NONREVERSED VEIN;  Surgeon: Angelia Mould, MD;  Location: [place];  Service: Vascular;  Laterality: Right;  . HERNIA REPAIR    . I & D EXTREMITY Right 03/31/2016   Procedure: IRRIGATION AND DEBRIDEMENT FOOT;  Surgeon: Angelia Mould, MD;  Location: [place];  Service: Vascular;  Laterality: Right;  . IMPLANTABLE CARDIOVERTER DEFIBRILLATOR IMPLANT N/A 06/27/2013   Procedure: SUB Q ICD;  Surgeon: Deboraha Sprang, MD;  Location: [facility] C[facility] LAB;  Service: Cardiovascular;  Laterality: N/A;  . INTRAOPERATIVE ARTERIOGRAM Right 03/31/2016   Procedure: INTRA OPERATIVE ARTERIOGRAM;  Surgeon: Angelia Mould, MD;  Location: [place];  Service: Vascular;   Laterality: Right;  . IR GENERIC HISTORICAL Right 11/30/2015   IR THROMBECTOMY AV FISTULA W/THROMBOLYSIS/PTA INC/SHUNT/IMG RIGHT 11/30/2015 Aletta Edouard, MD [facility]  . IR GENERIC HISTORICAL  11/30/2015   IR US GUIDE VASC ACCESS RIGHT 11/30/2015 Aletta Edouard, MD [facility]  . IR GENERIC HISTORICAL Right 12/15/2015   IR THROMBECTOMY AV FISTULA W/THROMBOLYSIS/PTA/STENT INC/SHUNT/IMG RT 12/15/2015 Arne Cleveland, MD [facility]  . IR GENERIC HISTORICAL  12/15/2015   IR US GUIDE VASC ACCESS RIGHT 12/15/2015 Arne Cleveland, MD [facility]  . IR GENERIC HISTORICAL  12/28/2015   IR FLUORO GUIDE CV LINE RIGHT 12/28/2015 Marybelle Killings, MD [facility]  . IR GENERIC HISTORICAL  12/28/2015   IR US GUIDE VASC ACCESS RIGHT 12/28/2015 Marybelle Killings, MD [facility]  . LEFT A ND RIGHT HEART CATH  01/30/2013   DR Sung Amabile  . LEFT AND RIGHT HEART CATHETERIZATION WITH CORONARY ANGIOGRAM N/A 01/30/2013   Procedure: LEFT AND RIGHT HEART CATHETERIZATION WITH CORONARY ANGIOGRAM;  Surgeon: Jolaine Artist, MD;  Location: [facility] LAB;  Service: Cardiovascular;  Laterality: N/A;  . PERIPHERAL VASCULAR CATHETERIZATION Right 01/26/2015   Procedure: A/V Fistulagram;  Surgeon: Angelia Mould, MD;  Location: [place] [facility];  Service: Cardiovascular;  Laterality: Right;  . reapea urethral surgery for recurrent obstruction  2011  . TOTAL KNEE ARTHROPLASTY Right 2007  . VEIN HARVEST Right 03/31/2016   Procedure: RIGHT GREATER SAPHENOUS VEIN HARVEST;  Surgeon: Angelia Mould, MD;  Location: [place];  Service: Vascular;  Laterality: Right;     Allergies  Allergen Reactions  . Epoetin Alfa   . Ferumoxytol   . Morphine Sulfate Rash and Other (See Comments)    Itches all over, red spots      Family History  Problem Relation Age of Onset  . Bladder Cancer Mother   . Alcohol abuse Father   . Melanoma Father   . Stroke Maternal Grandmother   . Heart Problems Maternal Grandmother         unknown  . Diabetes Maternal Grandmother   . Heart disease Maternal Grandfather   . Prostate cancer Maternal Grandfather      Social History Mr. Thetford reports that  he quit smoking about 9 years ago. His smoking use included cigarettes. He has a 64.00 pack-year smoking history. He has never used smokeless tobacco. Mr. Searls reports no history of alcohol use.   Review of Systems CONSTITUTIONAL: No weight loss, fever, chills, weakness or fatigue.  HEENT: Eyes: No visual loss, blurred vision, double vision or yellow sclerae.No hearing loss, sneezing, congestion, runny nose or sore throat.  SKIN: No rash or itching.  CARDIOVASCULAR: per hpi RESPIRATORY: No shortness of breath, cough or sputum.  GASTROINTESTINAL: No anorexia, nausea, vomiting or diarrhea. No abdominal pain or blood.  GENITOURINARY: No burning on urination, no polyuria NEUROLOGICAL: No headache, dizziness, syncope, paralysis, ataxia, numbness or tingling in the extremities. No change in bowel or bladder control.  MUSCULOSKELETAL: No muscle, back pain, joint pain or stiffness.  LYMPHATICS: No enlarged nodes. No history of splenectomy.  PSYCHIATRIC: No history of depression or anxiety.  ENDOCRINOLOGIC: No reports of sweating, cold or heat intolerance. No polyuria or polydipsia.  [name]   Physical Examination Today's Vitals   08/09/19 1241  BP: (!) 132/60  Pulse: 78  SpO2: 96%  Weight: (!) 213 lb (96.6 kg)  Height: _0  (1.778 m)   Body mass index is 30.56 kg/m.  Gen: resting comfortably, no acute distress HEENT: no scleral icterus, pupils equal round and reactive, no palptable cervical adenopathy,  CV: RRR, , no m/r/g, no jvd Resp: Clear to auscultation bilaterally GI: abdomen is soft, non-tender, non-distended, normal bowel sounds, no hepatosplenomegaly MSK: extremities are warm, no edema.  Skin: warm, no rash Neuro:  no focal deficits Psych: appropriate affect   Diagnostic Studies  08/2015 echo Study  Conclusions  - Left ventricle: The cavity size was normal. Systolic function was normal. The estimated ejection fraction was in the range of 50% to 55%. Wall motion was normal; there were no regional wall motion abnormalities. Doppler parameters are consistent with abnormal left ventricular relaxation (grade 1 diastolic dysfunction). - Left atrium: The atrium was mildly dilated. - Atrial septum: No defect or patent foramen ovale was identified.  Jan 2015 cath Findings:  RA = 6 RV = 48/5/7 PA = 47/22 (33) PCW = 20 Fick cardiac output/index = 5.2/2.5 PVR = 2.5 WU SVR = 1192 FA sat = 98% PA sat = 65%, 68%  Ao Pressure: 116/64 (84) LV Pressure: 122/13/18 There was no signficant gradient across the aortic valve on pullback.  Left main: Mild ostial tapering otherwise ok  LAD: Narrow vessel (due to diffuse diabetic vasculopathy). 30% ostial. 40-50% tubular lesion in the mid section. 80% lesion distally. Large diagonal with moderate diffuse disease and 70% lesion in midsection  LCX: Non dominant vessel with diffuse diabetic vasculopathy. Large OM-1. Small OM-2 and OM-3. 2 PLs. 30% mid AV groove CX lesion. 40% lesion in proximal OM-1  RCA: Large dominant vessel with just mild plaque  Assessment: 1. CAD with diffuse diabetic vasculopathy. Only high grade lesion is in small distal LAD 2. Well-compensated hemodynamics   Assessment and Plan  1. Chronic systolic HF - LVEF has now normalized - medical therapy has been limited due to low bp's during HD, as well as dizziness - continue current regimen at this time  2. HTN - at goal, continue current meds  3. CAD - nonobstructive diseaseby prior cath.  - no symptoms, continue current meds   4. Hyperlipidemia - LDL in 80s, reasonable at this time. If trends further up would transition to crestor. He had nonobstructive CAD on cath, history of DM2.  Arnoldo Lenis, M.D.

## 2019-08-09 NOTE — Patient Instructions (Signed)
Medication Instructions:  Continue all current medications.   Labwork: none  Testing/Procedures: none  Follow-Up: 6 months   Any Other Special Instructions Will Be Listed Below (If Applicable).   If you need a refill on your cardiac medications before your next appointment, please call your pharmacy.  

## 2019-08-10 DIAGNOSIS — N2581 Secondary hyperparathyroidism of renal origin: Secondary | ICD-10-CM | POA: Diagnosis not present

## 2019-08-10 DIAGNOSIS — N186 End stage renal disease: Secondary | ICD-10-CM | POA: Diagnosis not present

## 2019-08-10 DIAGNOSIS — D509 Iron deficiency anemia, unspecified: Secondary | ICD-10-CM | POA: Diagnosis not present

## 2019-08-10 DIAGNOSIS — Z992 Dependence on renal dialysis: Secondary | ICD-10-CM | POA: Diagnosis not present

## 2019-08-10 DIAGNOSIS — D631 Anemia in chronic kidney disease: Secondary | ICD-10-CM | POA: Diagnosis not present

## 2019-08-12 ENCOUNTER — Ambulatory Visit: Payer: Medicare Other | Admitting: Family Medicine

## 2019-08-13 DIAGNOSIS — N2581 Secondary hyperparathyroidism of renal origin: Secondary | ICD-10-CM | POA: Diagnosis not present

## 2019-08-13 DIAGNOSIS — D509 Iron deficiency anemia, unspecified: Secondary | ICD-10-CM | POA: Diagnosis not present

## 2019-08-13 DIAGNOSIS — N186 End stage renal disease: Secondary | ICD-10-CM | POA: Diagnosis not present

## 2019-08-13 DIAGNOSIS — Z992 Dependence on renal dialysis: Secondary | ICD-10-CM | POA: Diagnosis not present

## 2019-08-13 DIAGNOSIS — R6883 Chills (without fever): Secondary | ICD-10-CM | POA: Diagnosis not present

## 2019-08-13 DIAGNOSIS — D631 Anemia in chronic kidney disease: Secondary | ICD-10-CM | POA: Diagnosis not present

## 2019-08-13 DIAGNOSIS — R509 Fever, unspecified: Secondary | ICD-10-CM | POA: Diagnosis not present

## 2019-08-14 DIAGNOSIS — Z992 Dependence on renal dialysis: Secondary | ICD-10-CM | POA: Diagnosis not present

## 2019-08-14 DIAGNOSIS — E8770 Fluid overload, unspecified: Secondary | ICD-10-CM | POA: Diagnosis not present

## 2019-08-14 DIAGNOSIS — N186 End stage renal disease: Secondary | ICD-10-CM | POA: Diagnosis not present

## 2019-08-14 DIAGNOSIS — E8779 Other fluid overload: Secondary | ICD-10-CM | POA: Diagnosis not present

## 2019-08-15 DIAGNOSIS — D631 Anemia in chronic kidney disease: Secondary | ICD-10-CM | POA: Diagnosis not present

## 2019-08-15 DIAGNOSIS — N2581 Secondary hyperparathyroidism of renal origin: Secondary | ICD-10-CM | POA: Diagnosis not present

## 2019-08-15 DIAGNOSIS — D509 Iron deficiency anemia, unspecified: Secondary | ICD-10-CM | POA: Diagnosis not present

## 2019-08-15 DIAGNOSIS — N186 End stage renal disease: Secondary | ICD-10-CM | POA: Diagnosis not present

## 2019-08-15 DIAGNOSIS — Z992 Dependence on renal dialysis: Secondary | ICD-10-CM | POA: Diagnosis not present

## 2019-08-15 DIAGNOSIS — R509 Fever, unspecified: Secondary | ICD-10-CM | POA: Diagnosis not present

## 2019-08-17 ENCOUNTER — Encounter (HOSPITAL_COMMUNITY): Payer: Self-pay | Admitting: Emergency Medicine

## 2019-08-17 ENCOUNTER — Emergency Department (HOSPITAL_COMMUNITY): Payer: Medicare Other

## 2019-08-17 ENCOUNTER — Other Ambulatory Visit: Payer: Self-pay

## 2019-08-17 ENCOUNTER — Observation Stay (HOSPITAL_COMMUNITY)
Admission: EM | Admit: 2019-08-17 | Discharge: 2019-08-18 | Disposition: A | Discharge status: Home / Self Care | Payer: Medicare Other | Attending: Family Medicine | Admitting: Family Medicine

## 2019-08-17 DIAGNOSIS — Z794 Long term (current) use of insulin: Secondary | ICD-10-CM | POA: Insufficient documentation

## 2019-08-17 DIAGNOSIS — N2581 Secondary hyperparathyroidism of renal origin: Secondary | ICD-10-CM | POA: Diagnosis not present

## 2019-08-17 DIAGNOSIS — R0902 Hypoxemia: Secondary | ICD-10-CM | POA: Diagnosis not present

## 2019-08-17 DIAGNOSIS — R0689 Other abnormalities of breathing: Secondary | ICD-10-CM | POA: Diagnosis not present

## 2019-08-17 DIAGNOSIS — D696 Thrombocytopenia, unspecified: Secondary | ICD-10-CM | POA: Diagnosis present

## 2019-08-17 DIAGNOSIS — J449 Chronic obstructive pulmonary disease, unspecified: Secondary | ICD-10-CM | POA: Diagnosis present

## 2019-08-17 DIAGNOSIS — E1129 Type 2 diabetes mellitus with other diabetic kidney complication: Secondary | ICD-10-CM | POA: Diagnosis not present

## 2019-08-17 DIAGNOSIS — Z96651 Presence of right artificial knee joint: Secondary | ICD-10-CM | POA: Insufficient documentation

## 2019-08-17 DIAGNOSIS — I739 Peripheral vascular disease, unspecified: Secondary | ICD-10-CM | POA: Diagnosis not present

## 2019-08-17 DIAGNOSIS — I959 Hypotension, unspecified: Secondary | ICD-10-CM | POA: Diagnosis not present

## 2019-08-17 DIAGNOSIS — D631 Anemia in chronic kidney disease: Secondary | ICD-10-CM | POA: Diagnosis present

## 2019-08-17 DIAGNOSIS — R509 Fever, unspecified: Principal | ICD-10-CM | POA: Diagnosis present

## 2019-08-17 DIAGNOSIS — R000 Tachycardia, unspecified: Secondary | ICD-10-CM | POA: Diagnosis not present

## 2019-08-17 DIAGNOSIS — Z87891 Personal history of nicotine dependence: Secondary | ICD-10-CM | POA: Insufficient documentation

## 2019-08-17 DIAGNOSIS — N186 End stage renal disease: Secondary | ICD-10-CM | POA: Diagnosis not present

## 2019-08-17 DIAGNOSIS — I428 Other cardiomyopathies: Secondary | ICD-10-CM

## 2019-08-17 DIAGNOSIS — M8949 Other hypertrophic osteoarthropathy, multiple sites: Secondary | ICD-10-CM

## 2019-08-17 DIAGNOSIS — R6510 Systemic inflammatory response syndrome (SIRS) of non-infectious origin without acute organ dysfunction: Secondary | ICD-10-CM | POA: Diagnosis not present

## 2019-08-17 DIAGNOSIS — I10 Essential (primary) hypertension: Secondary | ICD-10-CM | POA: Diagnosis not present

## 2019-08-17 DIAGNOSIS — Z89512 Acquired absence of left leg below knee: Secondary | ICD-10-CM

## 2019-08-17 DIAGNOSIS — E1165 Type 2 diabetes mellitus with hyperglycemia: Secondary | ICD-10-CM | POA: Diagnosis present

## 2019-08-17 DIAGNOSIS — I509 Heart failure, unspecified: Secondary | ICD-10-CM | POA: Insufficient documentation

## 2019-08-17 DIAGNOSIS — E785 Hyperlipidemia, unspecified: Secondary | ICD-10-CM | POA: Diagnosis present

## 2019-08-17 DIAGNOSIS — Z7982 Long term (current) use of aspirin: Secondary | ICD-10-CM | POA: Insufficient documentation

## 2019-08-17 DIAGNOSIS — I5022 Chronic systolic (congestive) heart failure: Secondary | ICD-10-CM | POA: Diagnosis present

## 2019-08-17 DIAGNOSIS — D509 Iron deficiency anemia, unspecified: Secondary | ICD-10-CM | POA: Diagnosis not present

## 2019-08-17 DIAGNOSIS — R404 Transient alteration of awareness: Secondary | ICD-10-CM | POA: Diagnosis not present

## 2019-08-17 DIAGNOSIS — I132 Hypertensive heart and chronic kidney disease with heart failure and with stage 5 chronic kidney disease, or end stage renal disease: Secondary | ICD-10-CM | POA: Diagnosis not present

## 2019-08-17 DIAGNOSIS — Z79899 Other long term (current) drug therapy: Secondary | ICD-10-CM | POA: Diagnosis not present

## 2019-08-17 DIAGNOSIS — Z20822 Contact with and (suspected) exposure to covid-19: Secondary | ICD-10-CM | POA: Insufficient documentation

## 2019-08-17 DIAGNOSIS — I2510 Atherosclerotic heart disease of native coronary artery without angina pectoris: Secondary | ICD-10-CM | POA: Diagnosis not present

## 2019-08-17 DIAGNOSIS — E1140 Type 2 diabetes mellitus with diabetic neuropathy, unspecified: Secondary | ICD-10-CM | POA: Insufficient documentation

## 2019-08-17 DIAGNOSIS — L89102 Pressure ulcer of unspecified part of back, stage 2: Secondary | ICD-10-CM | POA: Insufficient documentation

## 2019-08-17 DIAGNOSIS — Z992 Dependence on renal dialysis: Secondary | ICD-10-CM | POA: Diagnosis not present

## 2019-08-17 DIAGNOSIS — Z9181 History of falling: Secondary | ICD-10-CM

## 2019-08-17 DIAGNOSIS — M170 Bilateral primary osteoarthritis of knee: Secondary | ICD-10-CM | POA: Diagnosis present

## 2019-08-17 DIAGNOSIS — N189 Chronic kidney disease, unspecified: Secondary | ICD-10-CM | POA: Diagnosis not present

## 2019-08-17 DIAGNOSIS — E1122 Type 2 diabetes mellitus with diabetic chronic kidney disease: Secondary | ICD-10-CM | POA: Insufficient documentation

## 2019-08-17 DIAGNOSIS — E1142 Type 2 diabetes mellitus with diabetic polyneuropathy: Secondary | ICD-10-CM | POA: Diagnosis present

## 2019-08-17 DIAGNOSIS — Z89511 Acquired absence of right leg below knee: Secondary | ICD-10-CM

## 2019-08-17 DIAGNOSIS — J9811 Atelectasis: Secondary | ICD-10-CM | POA: Diagnosis not present

## 2019-08-17 DIAGNOSIS — L8990 Pressure ulcer of unspecified site, unspecified stage: Secondary | ICD-10-CM | POA: Insufficient documentation

## 2019-08-17 DIAGNOSIS — M159 Polyosteoarthritis, unspecified: Secondary | ICD-10-CM | POA: Diagnosis present

## 2019-08-17 DIAGNOSIS — E1169 Type 2 diabetes mellitus with other specified complication: Secondary | ICD-10-CM | POA: Diagnosis present

## 2019-08-17 LAB — CBC WITH DIFFERENTIAL/PLATELET
Abs Immature Granulocytes: 0.02 10*3/uL (ref 0.00–0.07)
Basophils Absolute: 0 10*3/uL (ref 0.0–0.1)
Basophils Relative: 0 %
Eosinophils Absolute: 0 10*3/uL (ref 0.0–0.5)
Eosinophils Relative: 0 %
HCT: 40 % (ref 39.0–52.0)
Hemoglobin: 12.7 g/dL — ABNORMAL LOW (ref 13.0–17.0)
Immature Granulocytes: 0 %
Lymphocytes Relative: 3 %
Lymphs Abs: 0.2 10*3/uL — ABNORMAL LOW (ref 0.7–4.0)
MCH: 28.5 pg (ref 26.0–34.0)
MCHC: 31.8 g/dL (ref 30.0–36.0)
MCV: 89.7 fL (ref 80.0–100.0)
Monocytes Absolute: 0.6 10*3/uL (ref 0.1–1.0)
Monocytes Relative: 7 %
Neutro Abs: 8.3 10*3/uL — ABNORMAL HIGH (ref 1.7–7.7)
Neutrophils Relative %: 90 %
Platelets: 138 10*3/uL — ABNORMAL LOW (ref 150–400)
RBC: 4.46 MIL/uL (ref 4.22–5.81)
RDW: 15 % (ref 11.5–15.5)
WBC: 9.2 10*3/uL (ref 4.0–10.5)
nRBC: 0 % (ref 0.0–0.2)

## 2019-08-17 LAB — COMPREHENSIVE METABOLIC PANEL
ALT: 29 U/L (ref 0–44)
AST: 32 U/L (ref 15–41)
Albumin: 3.9 g/dL (ref 3.5–5.0)
Alkaline Phosphatase: 60 U/L (ref 38–126)
Anion gap: 12 (ref 5–15)
BUN: 29 mg/dL — ABNORMAL HIGH (ref 6–20)
CO2: 25 mmol/L (ref 22–32)
Calcium: 8.4 mg/dL — ABNORMAL LOW (ref 8.9–10.3)
Chloride: 96 mmol/L — ABNORMAL LOW (ref 98–111)
Creatinine, Ser: 6.46 mg/dL — ABNORMAL HIGH (ref 0.61–1.24)
GFR calc Af Amer: 10 mL/min — ABNORMAL LOW (ref >60)
GFR calc non Af Amer: 9 mL/min — ABNORMAL LOW (ref >60)
Glucose, Bld: 168 mg/dL — ABNORMAL HIGH (ref 70–99)
Potassium: 4 mmol/L (ref 3.5–5.1)
Sodium: 133 mmol/L — ABNORMAL LOW (ref 135–145)
Total Bilirubin: 0.7 mg/dL (ref 0.3–1.2)
Total Protein: 8.1 g/dL (ref 6.5–8.1)

## 2019-08-17 LAB — GLUCOSE, CAPILLARY
Glucose-Capillary: 153 mg/dL — ABNORMAL HIGH (ref 70–99)
Glucose-Capillary: 120 mg/dL — ABNORMAL HIGH (ref 70–99)

## 2019-08-17 LAB — SARS CORONAVIRUS 2 BY RT PCR (HOSPITAL ORDER, PERFORMED IN ~~LOC~~ HOSPITAL LAB): SARS Coronavirus 2: NEGATIVE

## 2019-08-17 LAB — APTT: aPTT: 32 seconds (ref 24–36)

## 2019-08-17 LAB — PROTIME-INR
INR: 1.1 (ref 0.8–1.2)
Prothrombin Time: 14 seconds (ref 11.4–15.2)

## 2019-08-17 LAB — PROCALCITONIN: Procalcitonin: 2.61 ng/mL

## 2019-08-17 LAB — MRSA PCR SCREENING: MRSA by PCR: NEGATIVE

## 2019-08-17 LAB — LACTIC ACID, PLASMA
Lactic Acid, Venous: 1 mmol/L (ref 0.5–1.9)
Lactic Acid, Venous: 0.7 mmol/L (ref 0.5–1.9)

## 2019-08-17 MED ORDER — PIPERACILLIN-TAZOBACTAM 3.375 G IVPB 30 MIN
3.3750 g | Freq: Once | INTRAVENOUS | Status: AC
  Administered 2019-08-17: 3.375 g via INTRAVENOUS
  Filled 2019-08-17: qty 50

## 2019-08-17 MED ORDER — INSULIN GLARGINE 100 UNIT/ML ~~LOC~~ SOLN
30.0000 [IU] | Freq: Every day | SUBCUTANEOUS | Status: DC
  Administered 2019-08-17: 30 [IU] via SUBCUTANEOUS
  Filled 2019-08-17 (×2): qty 0.3

## 2019-08-17 MED ORDER — ONDANSETRON HCL 4 MG/2ML IJ SOLN
4.0000 mg | Freq: Four times a day (QID) | INTRAMUSCULAR | Status: DC | PRN
  Administered 2019-08-17: 4 mg via INTRAVENOUS
  Filled 2019-08-17: qty 2

## 2019-08-17 MED ORDER — ATORVASTATIN CALCIUM 40 MG PO TABS
80.0000 mg | ORAL_TABLET | Freq: Every day | ORAL | Status: DC
  Administered 2019-08-17: 80 mg via ORAL
  Filled 2019-08-17: qty 2

## 2019-08-17 MED ORDER — INSULIN ASPART 100 UNIT/ML ~~LOC~~ SOLN: 0.0000 [IU] | Freq: Every day | SUBCUTANEOUS | Status: DC

## 2019-08-17 MED ORDER — RENA-VITE PO TABS
1.0000 | ORAL_TABLET | Freq: Every day | ORAL | Status: DC
  Administered 2019-08-17 – 2019-08-18 (×2): 1 via ORAL
  Filled 2019-08-17 (×2): qty 1

## 2019-08-17 MED ORDER — ACETAMINOPHEN 325 MG PO TABS
650.0000 mg | ORAL_TABLET | Freq: Four times a day (QID) | ORAL | Status: DC | PRN
  Administered 2019-08-17 – 2019-08-18 (×3): 650 mg via ORAL
  Filled 2019-08-17 (×3): qty 2

## 2019-08-17 MED ORDER — INSULIN ASPART 100 UNIT/ML ~~LOC~~ SOLN
0.0000 [IU] | Freq: Three times a day (TID) | SUBCUTANEOUS | Status: DC
  Administered 2019-08-17: 2 [IU] via SUBCUTANEOUS
  Administered 2019-08-18: 5 [IU] via SUBCUTANEOUS

## 2019-08-17 MED ORDER — CARVEDILOL 3.125 MG PO TABS
3.1250 mg | ORAL_TABLET | Freq: Two times a day (BID) | ORAL | Status: DC
  Administered 2019-08-17: 3.125 mg via ORAL
  Filled 2019-08-17: qty 1

## 2019-08-17 MED ORDER — VANCOMYCIN HCL 500 MG/100ML IV SOLN
500.0000 mg | Freq: Once | INTRAVENOUS | Status: AC
  Administered 2019-08-17: 500 mg via INTRAVENOUS
  Filled 2019-08-17: qty 100

## 2019-08-17 MED ORDER — INSULIN ASPART 100 UNIT/ML ~~LOC~~ SOLN: 5.0000 [IU] | Freq: Three times a day (TID) | SUBCUTANEOUS | Status: DC

## 2019-08-17 MED ORDER — SODIUM CHLORIDE 0.9 % IV BOLUS
1000.0000 mL | Freq: Once | INTRAVENOUS | Status: AC
  Administered 2019-08-17: 1000 mL via INTRAVENOUS

## 2019-08-17 MED ORDER — ACETAMINOPHEN 500 MG PO TABS
1000.0000 mg | ORAL_TABLET | Freq: Once | ORAL | Status: AC
  Administered 2019-08-17: 1000 mg via ORAL
  Filled 2019-08-17: qty 2

## 2019-08-17 MED ORDER — OXYCODONE HCL 5 MG PO TABS: 5.0000 mg | ORAL_TABLET | Freq: Four times a day (QID) | ORAL | Status: DC | PRN

## 2019-08-17 MED ORDER — ASPIRIN EC 81 MG PO TBEC
81.0000 mg | DELAYED_RELEASE_TABLET | Freq: Every day | ORAL | Status: DC
  Administered 2019-08-17 – 2019-08-18 (×2): 81 mg via ORAL
  Filled 2019-08-17 (×2): qty 1

## 2019-08-17 MED ORDER — AZELASTINE HCL 0.1 % NA SOLN
2.0000 | Freq: Three times a day (TID) | NASAL | Status: DC | PRN
  Filled 2019-08-17: qty 30

## 2019-08-17 MED ORDER — LANTHANUM CARBONATE 500 MG PO CHEW: 1000.0000 mg | CHEWABLE_TABLET | Freq: Three times a day (TID) | ORAL | Status: DC

## 2019-08-17 MED ORDER — PIPERACILLIN-TAZOBACTAM 3.375 G IVPB
3.3750 g | Freq: Two times a day (BID) | INTRAVENOUS | Status: DC
  Administered 2019-08-18 (×2): 3.375 g via INTRAVENOUS
  Filled 2019-08-17 (×2): qty 50

## 2019-08-17 MED ORDER — ACETAMINOPHEN 650 MG RE SUPP
650.0000 mg | Freq: Four times a day (QID) | RECTAL | Status: DC | PRN
  Administered 2019-08-17: 650 mg via RECTAL
  Filled 2019-08-17: qty 1

## 2019-08-17 MED ORDER — VANCOMYCIN HCL IN DEXTROSE 1-5 GM/200ML-% IV SOLN
1000.0000 mg | Freq: Once | INTRAVENOUS | Status: AC
  Administered 2019-08-17: 1000 mg via INTRAVENOUS
  Filled 2019-08-17: qty 200

## 2019-08-17 MED ORDER — FENOFIBRATE 160 MG PO TABS
160.0000 mg | ORAL_TABLET | Freq: Every day | ORAL | Status: DC
  Administered 2019-08-18: 160 mg via ORAL
  Filled 2019-08-17 (×3): qty 1

## 2019-08-17 MED ORDER — HEPARIN SODIUM (PORCINE) 5000 UNIT/ML IJ SOLN
5000.0000 [IU] | Freq: Three times a day (TID) | INTRAMUSCULAR | Status: DC
  Administered 2019-08-17 – 2019-08-18 (×4): 5000 [IU] via SUBCUTANEOUS
  Filled 2019-08-17 (×4): qty 1

## 2019-08-17 MED ORDER — ALBUTEROL SULFATE (2.5 MG/3ML) 0.083% IN NEBU: 2.5000 mg | INHALATION_SOLUTION | RESPIRATORY_TRACT | Status: DC | PRN

## 2019-08-17 MED ORDER — GABAPENTIN 300 MG PO CAPS: 600.0000 mg | ORAL_CAPSULE | Freq: Two times a day (BID) | ORAL | Status: DC | PRN

## 2019-08-17 MED ORDER — OLOPATADINE HCL 0.1 % OP SOLN
1.0000 [drp] | Freq: Two times a day (BID) | OPHTHALMIC | Status: DC | PRN
  Filled 2019-08-17: qty 5

## 2019-08-17 MED ORDER — ONDANSETRON HCL 4 MG PO TABS: 4.0000 mg | ORAL_TABLET | Freq: Four times a day (QID) | ORAL | Status: DC | PRN

## 2019-08-17 MED ORDER — BISACODYL 5 MG PO TBEC: 5.0000 mg | DELAYED_RELEASE_TABLET | Freq: Every day | ORAL | Status: DC | PRN

## 2019-08-17 MED ORDER — METOPROLOL TARTRATE 5 MG/5ML IV SOLN: 5.0000 mg | Freq: Four times a day (QID) | INTRAVENOUS | Status: DC | PRN

## 2019-08-17 NOTE — H&P (Addendum)
History and Physical  [facility]  ARVLE PRIDEAUX YNW:[id number] DOB: 1960/02/15 DOA: 08/17/2019  PCP: Kristian Covey, MD   Patient coming from: Home   I have personally briefly reviewed patient's old medical records in [facility]  Chief Complaint: Rigors   HPI: Anthony Bullock is a 59 y.o. male with medical history significant for CAD, systolic CHF, type 2 DM, HLD, ESRD on HD T,R,S who had an uneventful HD treatment today. He went home and started having fever and chills (rigors).  His temperature was up to 103 at home.  He was also having some confusion when his temperature was high.  He had been feeling his normal self prior to going to HD today except that he complained of a painful callous on his left foot that hurt when walking.  He was formerly seen by Dr. Kristian Covey for nephrology care and now is followed by Dr. Wolfgang Phoenix. He remains hopeful for a renal transplant.  For some reason all prior attempts for him to get a successful AV fistula have failed and he has been using a tunneled catheter in right chest wall for at least the last year. He says that he has been careful to keep it clean and the dialysis nurses have been careful to use good technique when accessing it for his HD treatments.  He was treated for a pneumonia in June 2021 but has not had any respiratory symptoms since recovering from that surgery.   He has longstanding peripheral vascular disease and is s/p right BKA by Dr. Aldean Baker in 2018.   ED Course: Pt arrived febrile with temp of 103.3, pulse 109, BP 157/35, pulse ox 94% on room air, BMP 30.56, His labs were stable after HD today, K 4.0, Glucose 168, Na 133, BUN 29, Creatinine 6.46, Lactic acid 1.0, procalcitonin 2.61, WBC 9.2, Hg 12.7, platelet 138, PT 14.0, INR 1.1, aPTT32, Sars 2 Coronavirus test negative, CXR no acute findings. UA pending. Blood cultures obtained.  Pt was started on broad spectrum IV antibiotics. No clear source of infection found.  Pt admitted  for a febrile illness with SIRS.      Review of Systems: As per HPI otherwise 10 point review of systems negative.    Past Medical History:  Diagnosis Date  . 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 Allergic rhinitis   . Anemia   . Arthritis   . Chronic systolic heart failure (HCC)    a. ECHO (12/2012) EF 25-30%, HK entireanteroseptal myocardium //  b.  EF 25%, diffuse HK, grade 1 diastolic dysfunction, MAC, mild LAE, normal RVSF, trivial pericardial effusion  . COPD (chronic obstructive pulmonary disease) (HCC)   . Diabetes mellitus type II   . Diabetic nephropathy (HCC)   . Diabetic neuropathy (HCC)   . ESRD on hemodialysis [facility])    started HD June 2017, goes to [facility] HD unit, Dr Fausto Skillern  . History of cardiac catheterization    a.Myoview 1/15:  There is significant left ventricular dysfunction. There may be slight scar at the apex. There is no significant ischemia. LV Ejection Fraction: 27%  //  b. RHC/LHC (1/15) with mean RA 6, PA 47/22 mean 33, mean PCWP 20, PVR 2.5 WU, CI 2.5; 80% dLAD stenosis, 70% diffuse large D.   . History of kidney stones   . Hyperlipidemia   . Hypertension   . Kidney stones   . NICM (nonischemic cardiomyopathy) (HCC)    Primarily  nonischemic. Echo (12/14) with EF 25-30%. Echo (3/15) with EF 25%, mild to moderately dilated LV, normal RV size and systolic function.   . Pneumonia   . Urethral stricture     Past Surgical History:  Procedure Laterality Date  . ABDOMINAL AORTOGRAM W/LOWER EXTREMITY N/A 03/30/2016   Procedure: Abdominal Aortogram w/Lower Extremity;  Surgeon: Chuck Hint, MD;  Location: [facility] INVASIVE CV LAB;  Service: Cardiovascular;  Laterality: N/A;  . AMPUTATION Right 04/26/2016   Procedure: Right Below Knee Amputation;  Surgeon: Nadara Mustard, MD;  Location: [facility] OR;  Service: Orthopedics;  Laterality: Right;  . AV FISTULA PLACEMENT Right 09/08/2015   Procedure: INSERTION OF 4-27mm x 45cm   ARTERIOVENOUS (AV) GORE-TEX GRAFT RIGHT UPPER  ARM;  Surgeon: Chuck Hint, MD;  Location: [facility] OR;  Service: Vascular;  Laterality: Right;  . AV FISTULA PLACEMENT Left 01/14/2016   Procedure: CREATION OF LEFT UPPER ARM ARTERIOVENOUS FISTULA;  Surgeon: Chuck Hint, MD;  Location: [facility] OR;  Service: Vascular;  Laterality: Left;  . BASCILIC VEIN TRANSPOSITION Right 08/22/2014   Procedure: RIGHT UPPER ARM BASCILIC VEIN TRANSPOSITION;  Surgeon: Chuck Hint, MD;  Location: [facility] OR;  Service: Vascular;  Laterality: Right;  . BELOW KNEE LEG AMPUTATION Right 04/26/2016  . CARDIAC CATHETERIZATION    . CARDIAC DEFIBRILLATOR PLACEMENT  06/27/2013   Sub Q       BY DR Graciela Husbands  . CATARACT EXTRACTION W/PHACO Right 08/06/2018   Procedure: CATARACT EXTRACTION PHACO AND INTRAOCULAR LENS PLACEMENT (IOC);  Surgeon: Fabio Pierce, MD;  Location: [facility] ORS;  Service: Ophthalmology;  Laterality: Right;  CDE: 4.06  . CATARACT EXTRACTION W/PHACO Left 08/20/2018   Procedure: CATARACT EXTRACTION PHACO AND INTRAOCULAR LENS PLACEMENT (IOC);  Surgeon: Fabio Pierce, MD;  Location: [facility] ORS;  Service: Ophthalmology;  Laterality: Left;  CDE: 6.76  . COLONOSCOPY WITH PROPOFOL N/A 07/22/2015   Procedure: COLONOSCOPY WITH PROPOFOL;  Surgeon: Sherrilyn Rist, MD;  Location: [facility] ENDOSCOPY;  Service: Gastroenterology;  Laterality: N/A;  . FEMORAL-POPLITEAL BYPASS GRAFT Right 03/31/2016   Procedure: BYPASS GRAFT FEMORAL-POPLITEAL ARTERY USING RIGHT GREATER SAPHENOUS NONREVERSED VEIN;  Surgeon: Chuck Hint, MD;  Location: [facility] OR;  Service: Vascular;  Laterality: Right;  . HERNIA REPAIR    . I & D EXTREMITY Right 03/31/2016   Procedure: IRRIGATION AND DEBRIDEMENT FOOT;  Surgeon: Chuck Hint, MD;  Location: [facility] OR;  Service: Vascular;  Laterality: Right;  . IMPLANTABLE CARDIOVERTER DEFIBRILLATOR IMPLANT N/A 06/27/2013   Procedure: SUB Q ICD;  Surgeon: Duke Salvia, MD;  Location: [facility] CATH LAB;  Service:  Cardiovascular;  Laterality: N/A;  . INTRAOPERATIVE ARTERIOGRAM Right 03/31/2016   Procedure: INTRA OPERATIVE ARTERIOGRAM;  Surgeon: Chuck Hint, MD;  Location: [facility] OR;  Service: Vascular;  Laterality: Right;  . IR GENERIC HISTORICAL Right 11/30/2015   IR THROMBECTOMY AV FISTULA W/THROMBOLYSIS/PTA INC/SHUNT/IMG RIGHT 11/30/2015 Irish Lack, MD [facility]-INTER[facility] RAD  . IR GENERIC HISTORICAL  11/30/2015   IR US GUIDE VASC ACCESS RIGHT 11/30/2015 Irish Lack, MD [facility]  . IR GENERIC HISTORICAL Right 12/15/2015   IR THROMBECTOMY AV FISTULA W/THROMBOLYSIS/PTA/STENT INC/SHUNT/IMG RT 12/15/2015 Oley Balm, MD [facility]  . IR GENERIC HISTORICAL  12/15/2015   IR US GUIDE VASC ACCESS RIGHT 12/15/2015 Oley Balm, MD [facility]  . IR GENERIC HISTORICAL  12/28/2015   IR FLUORO GUIDE CV LINE RIGHT 12/28/2015 Jolaine Click, MD [facility]  . IR GENERIC HISTORICAL  12/28/2015   IR US GUIDE VASC ACCESS RIGHT  12/28/2015 Jolaine Click, MD [facility]  . LEFT A ND RIGHT HEART CATH  01/30/2013   DR Jones Broom  . LEFT AND RIGHT HEART CATHETERIZATION WITH CORONARY ANGIOGRAM N/A 01/30/2013   Procedure: LEFT AND RIGHT HEART CATHETERIZATION WITH CORONARY ANGIOGRAM;  Surgeon: Dolores Patty, MD;  Location: [facility] CATH LAB;  Service: Cardiovascular;  Laterality: N/A;  . PERIPHERAL VASCULAR CATHETERIZATION Right 01/26/2015   Procedure: A/V Fistulagram;  Surgeon: Chuck Hint, MD;  Location: [facility] INVASIVE CV LAB;  Service: Cardiovascular;  Laterality: Right;  . reapea urethral surgery for recurrent obstruction  2011  . TOTAL KNEE ARTHROPLASTY Right 2007  . VEIN HARVEST Right 03/31/2016   Procedure: RIGHT GREATER SAPHENOUS VEIN HARVEST;  Surgeon: Chuck Hint, MD;  Location: [facility] OR;  Service: Vascular;  Laterality: Right;     reports that he quit smoking about 9 years ago. His smoking use included cigarettes. He has a 64.00 pack-year smoking history. He has never used smokeless tobacco.  He reports that he does not drink alcohol and does not use drugs.  Allergies  Allergen Reactions  . Epoetin Alfa   . Ferumoxytol   . Morphine Sulfate Rash and Other (See Comments)    Itches all over, red spots    Family History  Problem Relation Age of Onset  . Bladder Cancer Mother   . Alcohol abuse Father   . Melanoma Father   . Stroke Maternal Grandmother   . Heart Problems Maternal Grandmother        unknown  . Diabetes Maternal Grandmother   . Heart disease Maternal Grandfather   . Prostate cancer Maternal Grandfather      Prior to Admission medications   Medication Sig Start Date End Date Taking? Authorizing Provider  albuterol (PROVENTIL HFA;VENTOLIN HFA) 108 (90 Base) MCG/ACT inhaler Inhale 2 puffs into the lungs every 4 (four) hours as needed for wheezing or shortness of breath. 02/16/18  Yes Burchette, Elberta Fortis, MD  aspirin EC 81 MG tablet Take 81 mg by mouth daily.    Yes [provider]  atorvastatin (LIPITOR) 80 MG tablet Take 1 tablet (80 mg total) by mouth at bedtime. 05/08/19  Yes Burchette, Elberta Fortis, MD  azelastine (ASTELIN) 0.1 % nasal spray Place 2 sprays into both nostrils 3 (three) times daily as needed for rhinitis. Use in each nostril as directed 03/10/14  Yes Burchette, Elberta Fortis, MD  carvedilol (COREG) 12.5 MG tablet Take 1 tablet (12.5 mg total) by mouth 2 (two) times daily with a meal. 05/08/19  Yes Burchette, Elberta Fortis, MD  Continuous Blood Gluc Sensor (FREESTYLE LIBRE 14 DAY SENSOR) MISC USE AS DIRECTED EVERY 14 DAYS 08/02/19  Yes Burchette, Elberta Fortis, MD  fenofibrate 160 MG tablet Take 1 tablet (160 mg total) by mouth daily. 05/08/19  Yes Burchette, Elberta Fortis, MD  fexofenadine (ALLEGRA) 180 MG tablet Take 180 mg by mouth daily.   Yes [provider]  furosemide (LASIX) 40 MG tablet Take 3 tablets by mouth once daily 08/07/18  Yes Burchette, Elberta Fortis, MD  gabapentin (NEURONTIN) 300 MG capsule Take 2 capsules (600 mg total) by mouth 2 (two) times daily.  05/08/19  Yes Burchette, Elberta Fortis, MD  glucose blood test strip Check 1 time daily. E11.9 One Touch Ultra Blue Test Strips 03/10/14  Yes Burchette, Elberta Fortis, MD  Insulin Glargine [facility] KWIKPEN) 100 UNIT/ML INJECT 70 UNITS SUBCUTANEOUSLY AT BEDTIME 05/08/19  Yes Burchette, Elberta Fortis, MD  Insulin Lispro (HUMALOG KWIKPEN) 200 UNIT/ML SOPN Inject  10 Units into the skin 3 (three) times daily with meals. Use tid with meals as per sliding scale 03/16/18  Yes Burchette, Elberta Fortis, MD  Insulin Pen Needle (BD PEN NEEDLE NANO U/F) 32G X 4 MM MISC USE 1 PEN NEEDLE SUBCUTANEOUSLY WITH INSULIN 4 TIMES DAILY 12/21/18  Yes Burchette, Elberta Fortis, MD  lanthanum (FOSRENOL) 1000 MG chewable tablet Chew 1,000 mg by mouth 3 (three) times daily with meals. Take one tablet after each meal.    Yes [provider]  multivitamin (RENA-VIT) TABS tablet Take 1 tablet by mouth daily. 03/21/19  Yes Burchette, Elberta Fortis, MD  Olopatadine HCl (PATADAY) 0.2 % SOLN Place 1 drop into both eyes daily as needed (for allergies).    Yes [provider]    Physical Exam: Vitals:   08/17/19 1404 08/17/19 1416 08/17/19 1420 08/17/19 1430  BP: (!) 120/39 (!) 123/38  (!) 123/38  Pulse: (!) 101 100  98  Resp: (!) 24 (!) 22  (!) 23  Temp:   (!) 102.5 F (39.2 C)   TempSrc:   Oral   SpO2: 95% 95%  94%  Weight:      Height:       Constitutional: Pt appears tired and weak, he is alert and responsive to questions. NAD, calm, comfortable Eyes: PERRL, lids and conjunctivae normal ENMT: Mucous membranes are moist. Posterior pharynx clear of any exudate or lesions. Normal dentition.  Neck: normal, supple, no masses, no thyromegaly Respiratory: catheter coming out of right chest wall, does not appear to be infected, lungs clear to auscultation bilaterally, no wheezing, no crackles. Normal respiratory effort. No accessory muscle use.  Cardiovascular: normal s1,s2 sounds, no murmurs / rubs / gallops. No extremity edema. 1+ left pedal pulses.  No carotid bruits.  Abdomen: no tenderness, no masses palpated. No hepatosplenomegaly. Bowel sounds positive.  Musculoskeletal: Right BKA with prosthesis in place, no clubbing / cyanosis. No joint deformity upper and lower extremities. Good ROM, no contractures. Normal muscle tone.  Skin: Full body skin exam performed, he has healed buttock pressure wounds, no drainage or rash seen, he has pressure callus on lateral left foot, it is not open or draining and does not appear to be infected, he has a heal wound, it is dry, it does not appear to be infected.  No other rashes, lesions, ulcers. No induration Neurologic: CN 2-12 grossly intact. Sensation intact, DTR normal. Strength 5/5 in all 4.  Psychiatric: Normal judgment and insight. Alert and oriented x 3. Normal mood.   Labs on Admission: I have personally reviewed following labs and imaging studies  CBC: Recent Labs  Lab 08/17/19 1257  WBC 9.2  NEUTROABS 8.3*  HGB 12.7*  HCT 40.0  MCV 89.7  PLT 138*   Basic Metabolic Panel: Recent Labs  Lab 08/17/19 1257  NA 133*  K 4.0  CL 96*  CO2 25  GLUCOSE 168*  BUN 29*  CREATININE 6.46*  CALCIUM 8.4*   GFR: Estimated Creatinine Clearance: 14.7 mL/min (A) (by C-G formula based on SCr of 6.46 mg/dL (H)). Liver Function Tests: Recent Labs  Lab 08/17/19 1257  AST 32  ALT 29  ALKPHOS 60  BILITOT 0.7  PROT 8.1  ALBUMIN 3.9   No results for input(s): LIPASE, AMYLASE in the last 168 hours. No results for input(s): AMMONIA in the last 168 hours. Coagulation Profile: Recent Labs  Lab 08/17/19 1257  INR 1.1   Cardiac Enzymes: No results for input(s): CKTOTAL, CKMB, CKMBINDEX, TROPONINI in  the last 168 hours. BNP (last 3 results) No results for input(s): PROBNP in the last 8760 hours. HbA1C: No results for input(s): HGBA1C in the last 72 hours. CBG: Recent Labs  Lab 08/17/19 1554  GLUCAP 153*   Lipid Profile: No results for input(s): CHOL, HDL, LDLCALC, TRIG, CHOLHDL,  LDLDIRECT in the last 72 hours. Thyroid Function Tests: No results for input(s): TSH, T4TOTAL, FREET4, T3FREE, THYROIDAB in the last 72 hours. Anemia Panel: No results for input(s): VITAMINB12, FOLATE, FERRITIN, TIBC, IRON, RETICCTPCT in the last 72 hours. Urine analysis:    Component Value Date/Time   COLORURINE yellow 09/28/2009 1317   APPEARANCEUR Clear 09/28/2009 1317   LABSPEC 1.010 09/28/2009 1317   PHURINE 5.0 09/28/2009 1317   HGBUR 2+ 09/28/2009 1317   BILIRUBINUR negative 03/17/2016 1648   PROTEINUR trace 03/17/2016 1648   UROBILINOGEN 0.2 03/17/2016 1648   UROBILINOGEN 0.2 09/28/2009 1317   NITRITE negative 03/17/2016 1648   NITRITE negative 09/28/2009 1317   LEUKOCYTESUR small (1+) (A) 03/17/2016 1648    Radiological Exams on Admission: DG Chest Port 1 View  Result Date: 08/17/2019 CLINICAL DATA:  Dialysis for possible sepsis. EXAM: PORTABLE CHEST 1 VIEW COMPARISON:  07/01/2019 FINDINGS: Patient has RIGHT-sided dialysis catheter, tip overlying the UPPER RIGHT atrium. Stable appearance of subcutaneous subdural later bleeding. Heart size is mildly enlarged and stable in configuration. Minimal subsegmental atelectasis at the LEFT lung base. There has been significant improvement in the appearance of lungs since prior study. IMPRESSION: 1. Stable cardiomegaly. 2. Improved aeration. Electronically Signed   By: Norva Pavlov M.D.   On: 08/17/2019 13:14   Assessment/Plan Principal Problem:   Febrile illness Active Problems:   Poorly controlled type II diabetes mellitus with renal complication (HCC)   Hyperlipidemia   Essential hypertension   Diabetic peripheral neuropathy (HCC)   COPD (chronic obstructive pulmonary disease) (HCC)   Chronic systolic congestive heart failure (HCC)   CAD (coronary artery disease)   NICM (nonischemic cardiomyopathy) (HCC)   Osteoarthritis of multiple joints   Osteoarthritis of both knees   At high risk for falls   Peripheral vascular  disease (HCC)   ESRD (end stage renal disease) on dialysis (HCC)   Anemia due to chronic kidney disease   Status post unilateral below knee amputation, right (HCC)   SIRS (systemic inflammatory response syndrome) (HCC)   Thrombocytopenia (HCC)  1. SIRS - Pt presented with fever and tachycardia, no source of infection has been found but being investigated further.  Will try to get a urinalysis if patient makes any urine.  Follow blood cultures.  Continue broad spectrum antibiotics.  Check a procalcitonin level (add to blood in lab).  I do worry about his dialysis catheter as a source of infection.  I will ask for nephrology consultation for their opinion.  2. Febrile illness - cause unknown at this time, treat supportively. Sars 2 coronavirus test fortunately is negative.  3. ESRD on HD - Pt completed a full HD treatment today.  He is on a Tue, Thu, Sat scheduled.  I have consulted nephrology to assist with inpatient management.  4. Left heel/foot wound - From my exam today I do not see any lesion that looks actively infected to cause this presentation.  I have ordered left heel protection.  He will need outpatient follow up with  Dr. Aldean Baker.  5. Anemia / thrombocytopenia - stable, follow.  6. PVD - outpatient vascular surgery follow up.  7. Essential hypertension - we have reduced  some home BP meds due to soft BPs. Pt was given IV fluid boluses in ED. As BP recovers, would resume home med doses.  8. Chronic systolic HF/NICM - Recently seen by Dr. Wyline Mood. Most recent echo 8/17 LFEF  Had improved from 25% to 50-55%.   He has an ICD.  Dry weight reportedly 92.5 kg.  Temporarily holding home lasix due to soft BPs.  9. CAD - from cath 2015 he reportedly had diffuse nonobstructive disease.  10. Type 2 DM with vascular complications - he is not eating well now, we have reduced his insulin doses by 50% until he starts eating better, titrate doses as needed, monitor CBG 5 times per day.   DVT  prophylaxis: sQ heparin  Code Status: full   Family Communication: wife at bedside   Disposition Plan: home   Consults called: nephrology (to see 8/8)   Admission status: INP   Janeil Schexnayder MD [facility] How to contact the [facility] Attending or Consulting provider 7A - 7P or covering provider during after hours 7P -7A, for this patient?  1. Check the care team in [facility] and look for a) attending/consulting [facility] provider listed and b) the [facility] team listed 2. Log into [email or web address] and use [facility] universal password to access. If you do not have the password, please contact the hospital operator. 3. Locate the [facility] provider you are looking for under [facility] and page to a number that you can be directly reached. 4. If you still have difficulty reaching the provider, please page the [facility] (Director on Call) for the Hospitalists listed on amion for assistance.   If 7PM-7AM, please contact night-coverage [email or web address] Password [email or web address]  08/17/2019, 4:03 PM

## 2019-08-17 NOTE — ED Triage Notes (Signed)
Finished diaslysis   Tremors   Here for eval

## 2019-08-17 NOTE — Progress Notes (Signed)
Pharmacy Antibiotic Note  Anthony Bullock is a 59 y.o. male admitted on 08/17/2019 with sepsis.  Pharmacy has been consulted for vancomycin and zosyn dosing.  Plan: Zosyn 3.375g IV q12h (4 hour infusion). vancomycin 15mg /kg x 1 dose now, following pharmacist to order HD vancomycin dependant on schedule  Height: 5\' 10"  (177.8 cm) Weight: 96.6 kg (212 lb 15.4 oz) IBW/kg (Calculated) : 73  Temp (24hrs), Avg:102.9 F (39.4 C), Min:102.5 F (39.2 C), Max:103.3 F (39.6 C)  Recent Labs  Lab 08/17/19 1257  WBC 9.2  CREATININE 6.46*  LATICACIDVEN 1.0    Estimated Creatinine Clearance: 14.7 mL/min (A) (by C-G formula based on SCr of 6.46 mg/dL (H)).    Allergies  Allergen Reactions  . Epoetin Alfa   . Ferumoxytol   . Morphine Sulfate Rash and Other (See Comments)    Itches all over, red spots    Antimicrobials this admission: 8/7 vancomycin >>  8/7 zosyn >>   Microbiology results: 8/7 BCx: sent  8/7 UCx: sent   8/7 Covid 19: negative   Thank you for allowing pharmacy to be a part of this patient's care.  Donna Christen Clark [place] 08/17/2019 2:58 PM

## 2019-08-17 NOTE — ED Provider Notes (Signed)
[facility] EMERGENCY DEPARTMENT Provider Note   CSN: [id number] Arrival date & time: 08/17/19  1216     History Chief Complaint  Patient presents with  . Tremors    Anthony Bullock is a 59 y.o. male.  Patient presents with fevers and chills. Patient had his dialysis today and then after dialysis started having rigors. Patient has some mild confusion 2. He has a history of diabetes hypertension and recently developed ulcer to his foot  The history is provided by the patient and a relative. No language interpreter was used.  Fever Max temp prior to arrival:  103 Temp source:  Oral Severity:  Moderate Onset quality:  Sudden Timing:  Constant Progression:  Worsening Chronicity:  New Relieved by:  Nothing Worsened by:  Nothing Ineffective treatments:  None tried Associated symptoms: no chest pain, no congestion, no cough, no diarrhea, no headaches and no rash   Risk factors: no contaminated food        Past Medical History:  Diagnosis Date  . 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 Allergic rhinitis   . Anemia   . Arthritis   . Chronic systolic heart failure ([place])    a. ECHO (12/2012) EF 25-30%, HK entireanteroseptal myocardium //  b.  EF 25%, diffuse HK, grade 1 diastolic dysfunction, MAC, mild LAE, normal RVSF, trivial pericardial effusion  . COPD (chronic obstructive pulmonary disease) ([place])   . Diabetes mellitus type II   . Diabetic nephropathy ([place])   . Diabetic neuropathy ([place])   . ESRD on hemodialysis [facility])    started HD June 2017, goes to [facility] HD unit, Dr Hinda Lenis  . History of cardiac catheterization    a.Myoview 1/15:  There is significant left ventricular dysfunction. There may be slight scar at the apex. There is no significant ischemia. LV Ejection Fraction: 27%  //  b. RHC/LHC (1/15) with mean RA 6, PA 47/22 mean 33, mean PCWP 20, PVR 2.5 WU, CI 2.5; 80% dLAD stenosis, 70% diffuse large D.   . History of kidney  stones   . Hyperlipidemia   . Hypertension   . Kidney stones   . NICM (nonischemic cardiomyopathy) ([place])    Primarily nonischemic. Echo (12/14) with EF 25-30%. Echo (3/15) with EF 25%, mild to moderately dilated LV, normal RV size and systolic function.   . Pneumonia   . Urethral stricture     Patient Active Problem List   Diagnosis Date Noted  . Febrile illness 08/17/2019  . SIRS (systemic inflammatory response syndrome) ([place]) 08/17/2019  . Hypertensive crisis 07/10/2019  . Onychomycosis 08/16/2016  . Status post unilateral below knee amputation, right ([place]) 04/26/2016  . Bilateral carotid bruits 03/30/2016  . Diabetes mellitus with complication ([place])   . Anemia due to chronic kidney disease   . ESRD (end stage renal disease) on dialysis ([place]) 11/30/2015  . Acute respiratory failure with hypoxia ([place]) 11/29/2015  . At high risk for falls 09/29/2014  . Peripheral vascular disease ([place]) 09/29/2014  . Osteoarthritis of both knees 01/07/2014  . Osteoarthritis of multiple joints 10/06/2013  . NICM (nonischemic cardiomyopathy) ([place]) 06/27/2013  . CAD (coronary artery disease) 03/13/2013  . Abnormal nuclear stress test 01/27/2013  . Chronic systolic congestive heart failure ([place]) 01/27/2013  . COPD (chronic obstructive pulmonary disease) ([place]) 12/27/2012  . Diabetic peripheral neuropathy ([place]) 02/10/2012  . URINARY CALCULUS 09/28/2009  . Adjustment disorder with depressed mood 08/21/2009  . Poorly controlled type II diabetes mellitus with  renal complication ([place]) 73/71/0626  . Hyperlipidemia 04/14/2008  . Essential hypertension 04/14/2008  . ALLERGIC RHINITIS 04/14/2008    Past Surgical History:  Procedure Laterality Date  . ABDOMINAL AORTOGRAM W/LOWER EXTREMITY N/A 03/30/2016   Procedure: Abdominal Aortogram w/Lower Extremity;  Surgeon: Angelia Mould, MD;  Location: [place] CV LAB;  Service: Cardiovascular;  Laterality: N/A;  . AMPUTATION Right 04/26/2016    Procedure: Right Below Knee Amputation;  Surgeon: Newt Minion, MD;  Location: [place];  Service: Orthopedics;  Laterality: Right;  . AV FISTULA PLACEMENT Right 09/08/2015   Procedure: INSERTION OF 4-33m x 45cm  ARTERIOVENOUS (AV) GORE-TEX GRAFT RIGHT UPPER  ARM;  Surgeon: CAngelia Mould MD;  Location: [place]  Service: Vascular;  Laterality: Right;  . AV FISTULA PLACEMENT Left 01/14/2016   Procedure: CREATION OF LEFT UPPER ARM ARTERIOVENOUS FISTULA;  Surgeon: CAngelia Mould MD;  [place]  Service: Vascular;  Laterality: Left;  . BASCILIC VEIN TRANSPOSITION Right 08/22/2014   Procedure: RIGHT UPPER ARM BASCILIC VEIN TRANSPOSITION;  Surgeon: CAngelia Mould MD;  [place]  Service: Vascular;  Laterality: Right;  . BELOW KNEE LEG AMPUTATION Right 04/26/2016  . CARDIAC CATHETERIZATION    . CARDIAC DEFIBRILLATOR PLACEMENT  06/27/2013   Sub Q       BY DR KCaryl Comes . CATARACT EXTRACTION W/PHACO Right 08/06/2018   Procedure: CATARACT EXTRACTION PHACO AND INTRAOCULAR LENS PLACEMENT (IOC);  Surgeon: WBaruch Goldmann MD;  Location: [facility];  Service: Ophthalmology;  Laterality: Right;  CDE: 4.06  . CATARACT EXTRACTION W/PHACO Left 08/20/2018   Procedure: CATARACT EXTRACTION PHACO AND INTRAOCULAR LENS PLACEMENT (IOC);  Surgeon: WBaruch Goldmann MD;  Location: [facility];  Service: Ophthalmology;  Laterality: Left;  CDE: 6.76  . COLONOSCOPY WITH PROPOFOL N/A 07/22/2015   Procedure: COLONOSCOPY WITH PROPOFOL;  Surgeon: HDoran Stabler MD;  Location: [facility];  Service: Gastroenterology;  Laterality: N/A;  . FEMORAL-POPLITEAL BYPASS GRAFT Right 03/31/2016   Procedure: BYPASS GRAFT FEMORAL-POPLITEAL ARTERY USING RIGHT GREATER SAPHENOUS NONREVERSED VEIN;  Surgeon: CAngelia Mould MD;  Location: [facility]  Service: Vascular;  Laterality: Right;  . HERNIA REPAIR    . I & D EXTREMITY Right 03/31/2016   Procedure: IRRIGATION AND DEBRIDEMENT FOOT;  Surgeon: CAngelia Mould MD;   [place]  Service: Vascular;  Laterality: Right;  . IMPLANTABLE CARDIOVERTER DEFIBRILLATOR IMPLANT N/A 06/27/2013   Procedure: SUB Q ICD;  Surgeon: SDeboraha Sprang MD;  [facility];  Service: Cardiovascular;  Laterality: N/A;  . INTRAOPERATIVE ARTERIOGRAM Right 03/31/2016   Procedure: INTRA OPERATIVE ARTERIOGRAM;  Surgeon: CAngelia Mould MD;  [place]  Service: Vascular;  Laterality: Right;  . IR GENERIC HISTORICAL Right 11/30/2015   IR THROMBECTOMY AV FISTULA W/THROMBOLYSIS/PTA INC/SHUNT/IMG RIGHT 11/30/2015 GAletta Edouard MD [facility]  . IR GENERIC HISTORICAL  11/30/2015   IR [place]ASC ACCESS RIGHT 11/30/2015 GAletta Edouard MD [facility]  . IR GENERIC HISTORICAL Right 12/15/2015   IR THROMBECTOMY AV FISTULA W/THROMBOLYSIS/PTA/STENT INC/SHUNT/IMG RT 12/15/2015 DArne Cleveland MD [facility]  . IR GENERIC HISTORICAL  12/15/2015   IR [place] VASC ACCESS RIGHT 12/15/2015 DArne Cleveland MD [facility]  . IR GENERIC HISTORICAL  12/28/2015   IR FLUORO GUIDE CV LINE RIGHT 12/28/2015 AMarybelle Killings MD [facility]  . IR GENERIC HISTORICAL  12/28/2015   IR [place] VASC ACCESS RIGHT 12/28/2015 AMarybelle Killings MD [facility]  . LEFT A ND RIGHT HEART CATH  01/30/2013   DR Sung Amabile  . LEFT AND RIGHT HEART CATHETERIZATION WITH CORONARY ANGIOGRAM N/A 01/30/2013   Procedure: LEFT AND RIGHT HEART CATHETERIZATION WITH CORONARY ANGIOGRAM;  Surgeon: Jolaine Artist, MD;  Location: [facility] CATH LAB;  Service: Cardiovascular;  Laterality: N/A;  . PERIPHERAL VASCULAR CATHETERIZATION Right 01/26/2015   Procedure: A/V Fistulagram;  Surgeon: Angelia Mould, MD;  Location: [place] CV LAB;  Service: Cardiovascular;  Laterality: Right;  . reapea urethral surgery for recurrent obstruction  2011  . TOTAL KNEE ARTHROPLASTY Right 2007  . VEIN HARVEST Right 03/31/2016   Procedure: RIGHT GREATER SAPHENOUS VEIN HARVEST;  Surgeon: Angelia Mould, MD;  Location[facility] OR;   Service: Vascular;  Laterality: Right;       Family History  Problem Relation Age of Onset  . Bladder Cancer Mother   . Alcohol abuse Father   . Melanoma Father   . Stroke Maternal Grandmother   . Heart Problems Maternal Grandmother        unknown  . Diabetes Maternal Grandmother   . Heart disease Maternal Grandfather   . Prostate cancer Maternal Grandfather     Social History   Tobacco Use  . Smoking status: Former Smoker    Packs/day: 2.00    Years: 32.00    Pack years: 64.00    Types: Cigarettes    Quit date: 05/11/2010    Years since quitting: 9.2  . Smokeless tobacco: Never Used  Vaping Use  . Vaping Use: Never used  Substance Use Topics  . Alcohol use: No  . Drug use: No    Home Medications Prior to Admission medications   Medication Sig Start Date End Date Taking? Authorizing Provider  albuterol (PROVENTIL HFA;VENTOLIN HFA) 108 (90 Base) MCG/ACT inhaler Inhale 2 puffs into the lungs every 4 (four) hours as needed for wheezing or shortness of breath. 02/16/18  Yes Burchette, Alinda Sierras, MD  aspirin EC 81 MG tablet Take 81 mg by mouth daily.    Yes [provider]  atorvastatin (LIPITOR) 80 MG tablet Take 1 tablet (80 mg total) by mouth at bedtime. 05/08/19  Yes Burchette, Alinda Sierras, MD  azelastine (ASTELIN) 0.1 % nasal spray Place 2 sprays into both nostrils 3 (three) times daily as needed for rhinitis. Use in each nostril as directed 03/10/14  Yes Burchette, Alinda Sierras, MD  carvedilol (COREG) 12.5 MG tablet Take 1 tablet (12.5 mg total) by mouth 2 (two) times daily with a meal. 05/08/19  Yes Burchette, Alinda Sierras, MD  Continuous Blood Gluc Sensor (FREESTYLE LIBRE 14 DAY SENSOR) MISC USE AS DIRECTED EVERY 14 DAYS 08/02/19  Yes Burchette, Alinda Sierras, MD  fenofibrate 160 MG tablet Take 1 tablet (160 mg total) by mouth daily. 05/08/19  Yes Burchette, Alinda Sierras, MD  fexofenadine (ALLEGRA) 180 MG tablet Take 180 mg by mouth daily.   Yes [provider]  furosemide (LASIX) 40  MG tablet Take 3 tablets by mouth once daily 08/07/18  Yes Burchette, Alinda Sierras, MD  gabapentin (NEURONTIN) 300 MG capsule Take 2 capsules (600 mg total) by mouth 2 (two) times daily. 05/08/19  Yes Burchette, Alinda Sierras, MD  glucose blood test strip Check 1 time daily. E11.9 One Touch Ultra Blue Test Strips 03/10/14  Yes Burchette, Alinda Sierras, MD  Insulin Glargine [facility] KWIKPEN) 100 UNIT/ML INJECT 70 UNITS SUBCUTANEOUSLY AT BEDTIME 05/08/19  Yes Burchette, Alinda Sierras, MD  Insulin Lispro (HUMALOG KWIKPEN) 200 UNIT/ML SOPN Inject 10 Units into the skin 3 (three) times  daily with meals. Use tid with meals as per sliding scale 03/16/18  Yes Burchette, Alinda Sierras, MD  Insulin Pen Needle (BD PEN NEEDLE NANO U/F) 32G X 4 MM MISC USE 1 PEN NEEDLE SUBCUTANEOUSLY WITH INSULIN 4 TIMES DAILY 12/21/18  Yes Burchette, Alinda Sierras, MD  lanthanum (FOSRENOL) 1000 MG chewable tablet Chew 1,000 mg by mouth 3 (three) times daily with meals. Take one tablet after each meal.    Yes [provider]  multivitamin (RENA-VIT) TABS tablet Take 1 tablet by mouth daily. 03/21/19  Yes Burchette, Alinda Sierras, MD  Olopatadine HCl (PATADAY) 0.2 % SOLN Place 1 drop into both eyes daily as needed (for allergies).    Yes [provider]    Allergies    Epoetin alfa, Ferumoxytol, and Morphine sulfate  Review of Systems   Review of Systems  Constitutional: Positive for fever. Negative for appetite change and fatigue.  HENT: Negative for congestion, ear discharge and sinus pressure.   Eyes: Negative for discharge.  Respiratory: Negative for cough.   Cardiovascular: Negative for chest pain.  Gastrointestinal: Negative for abdominal pain and diarrhea.  Genitourinary: Negative for frequency and hematuria.  Musculoskeletal: Negative for back pain.  Skin: Negative for rash.  Neurological: Negative for seizures and headaches.  Psychiatric/Behavioral: Negative for hallucinations.    Physical Exam Updated Vital Signs BP (!) 123/38    Pulse 98   Temp (!) 102.5 F (39.2 C) (Oral)   Resp (!) 23   Ht _0  (1.778 m)   Wt 96.6 kg   SpO2 94%   BMI 30.56 kg/m   Physical Exam Vitals reviewed.  Constitutional:      Appearance: He is well-developed.     Comments: Mildly lethargic  HENT:     Head: Normocephalic.     Nose: Nose normal.  Eyes:     General: No scleral icterus.    Conjunctiva/sclera: Conjunctivae normal.  Neck:     Thyroid: No thyromegaly.  Cardiovascular:     Rate and Rhythm: Normal rate and regular rhythm.     Heart sounds: No murmur heard.  No friction rub. No gallop.   Pulmonary:     Breath sounds: No stridor. No wheezing or rales.  Chest:     Chest wall: No tenderness.  Abdominal:     General: There is no distension.     Tenderness: There is no abdominal tenderness. There is no rebound.  Musculoskeletal:        General: Normal range of motion.     Cervical back: Neck supple.  Lymphadenopathy:     Cervical: No cervical adenopathy.  Skin:    Findings: No erythema or rash.  Neurological:     Mental Status: He is disoriented.     Motor: No abnormal muscle tone.     Coordination: Coordination normal.  Psychiatric:        Behavior: Behavior normal.     ED Results / Procedures / Treatments   Labs (all labs ordered are listed, but only abnormal results are displayed) Labs Reviewed  COMPREHENSIVE METABOLIC PANEL - Abnormal; Notable for the following components:      Result Value   Sodium 133 (*)    Chloride 96 (*)    Glucose, Bld 168 (*)    BUN 29 (*)    Creatinine, Ser 6.46 (*)    Calcium 8.4 (*)    GFR calc non Af Amer 9 (*)    GFR calc Af Amer 10 (*)  All other components within normal limits  CBC WITH DIFFERENTIAL/PLATELET - Abnormal; Notable for the following components:   Hemoglobin 12.7 (*)    Platelets 138 (*)    Neutro Abs 8.3 (*)    Lymphs Abs 0.2 (*)    All other components within normal limits  SARS CORONAVIRUS 2 BY RT PCR (HOSPITAL ORDER, [place] [facility])  CULTURE, BLOOD (SINGLE)  URINE CULTURE  LACTIC ACID, PLASMA  PROTIME-INR  APTT  LACTIC ACID, PLASMA  URINALYSIS, ROUTINE W REFLEX MICROSCOPIC    EKG None  Radiology DG Chest Port 1 View  Result Date: 08/17/2019 CLINICAL DATA:  Dialysis for possible sepsis. EXAM: PORTABLE CHEST 1 VIEW COMPARISON:  07/01/2019 FINDINGS: Patient has RIGHT-sided dialysis catheter, tip overlying the UPPER RIGHT atrium. Stable appearance of subcutaneous subdural later bleeding. Heart size is mildly enlarged and stable in configuration. Minimal subsegmental atelectasis at the LEFT lung base. There has been significant improvement in the appearance of lungs since prior study. IMPRESSION: 1. Stable cardiomegaly. 2. Improved aeration. Electronically Signed   By: Nolon Nations M.D.   On: 08/17/2019 13:14    Procedures Procedures (including critical care time)  Medications Ordered in ED Medications  acetaminophen (TYLENOL) tablet 1,000 mg (1,000 mg Oral Given 08/17/19 1238)  sodium chloride 0.9 % bolus 1,000 mL (0 mLs Intravenous Stopped 08/17/19 1357)  vancomycin (VANCOCIN) IVPB 1000 mg/200 mL premix (0 mg Intravenous Stopped 08/17/19 1436)  piperacillin-tazobactam (ZOSYN) IVPB 3.375 g (0 g Intravenous Stopped 08/17/19 1327)  sodium chloride 0.9 % bolus 1,000 mL (1,000 mLs Intravenous New Bag/Given 08/17/19 1419)    ED Course  I have reviewed the triage vital signs and the nursing notes.  Pertinent labs & imaging results that were available during my care of the patient were reviewed by me and considered in my medical decision making (see chart for details). CRITICAL CARE Performed by: Milton Ferguson Total critical care time: 35 minutes Critical care time was exclusive of separately billable procedures and treating other patients. Critical care was necessary to treat or prevent imminent or life-threatening deterioration. Critical care was time spent personally by me on the following  activities: development of treatment plan with patient and/or surrogate as well as nursing, discussions with consultants, evaluation of patient's response to treatment, examination of patient, obtaining history from patient or surrogate, ordering and performing treatments and interventions, ordering and review of laboratory studies, ordering and review of radiographic studies, pulse oximetry and re-evaluation of patient's condition.    MDM Rules/Calculators/A&P                          Patient with febrile illness. While confusion and possible sepsis he will be admitted for IV antibiotics and continued work-up for the possible sepsis           This patient presents to the ED for concern of febrile illness, this involves an extensive number of treatment options, and is a complaint that carries with it a high risk of complications and morbidity.  The differential diagnosis includes sepsis viral infection   Lab Tests:   I Ordered, reviewed, and interpreted labs, which included CBC chemistries which showed renal failure which is not known  Medicines ordered:   I ordered medication normal saline and antibiotics  Imaging Studies ordered:   I ordered imaging studies which included chest x-ray and  I independently visualized and interpreted imaging which showed unremarkable  Additional history obtained:   Additional  history obtained from records  Previous records obtained and reviewed.  Consultations Obtained:   I consulted hospitalist and discussed lab and imaging findings  Reevaluation:  After the interventions stated above, I reevaluated the patient and found mild improvement  Critical Interventions:  .   Final Clinical Impression(s) / ED Diagnoses Final diagnoses:  Febrile illness, acute    Rx / DC Orders ED Discharge Orders    None       Milton Ferguson, MD 08/17/19 1458

## 2019-08-17 NOTE — ED Notes (Signed)
Report to [place], RN  Call to [place], [place] who will transfer

## 2019-08-17 NOTE — ED Notes (Signed)
From dialysis   Tremors started   In by EMS- no IV   IV meds fluids EPD to bedside   Rad   Now bld clt

## 2019-08-18 ENCOUNTER — Inpatient Hospital Stay (HOSPITAL_COMMUNITY): Payer: Medicare Other

## 2019-08-18 DIAGNOSIS — I517 Cardiomegaly: Secondary | ICD-10-CM | POA: Diagnosis not present

## 2019-08-18 DIAGNOSIS — J9811 Atelectasis: Secondary | ICD-10-CM | POA: Diagnosis not present

## 2019-08-18 DIAGNOSIS — R6510 Systemic inflammatory response syndrome (SIRS) of non-infectious origin without acute organ dysfunction: Secondary | ICD-10-CM | POA: Diagnosis not present

## 2019-08-18 DIAGNOSIS — Z992 Dependence on renal dialysis: Secondary | ICD-10-CM

## 2019-08-18 DIAGNOSIS — N189 Chronic kidney disease, unspecified: Secondary | ICD-10-CM | POA: Diagnosis not present

## 2019-08-18 DIAGNOSIS — N186 End stage renal disease: Secondary | ICD-10-CM

## 2019-08-18 DIAGNOSIS — I120 Hypertensive chronic kidney disease with stage 5 chronic kidney disease or end stage renal disease: Secondary | ICD-10-CM | POA: Diagnosis not present

## 2019-08-18 DIAGNOSIS — E1142 Type 2 diabetes mellitus with diabetic polyneuropathy: Secondary | ICD-10-CM | POA: Diagnosis not present

## 2019-08-18 DIAGNOSIS — Z9181 History of falling: Secondary | ICD-10-CM | POA: Diagnosis not present

## 2019-08-18 DIAGNOSIS — J449 Chronic obstructive pulmonary disease, unspecified: Secondary | ICD-10-CM | POA: Diagnosis not present

## 2019-08-18 DIAGNOSIS — I5022 Chronic systolic (congestive) heart failure: Secondary | ICD-10-CM | POA: Diagnosis not present

## 2019-08-18 DIAGNOSIS — R509 Fever, unspecified: Secondary | ICD-10-CM | POA: Diagnosis not present

## 2019-08-18 DIAGNOSIS — M8949 Other hypertrophic osteoarthropathy, multiple sites: Secondary | ICD-10-CM | POA: Diagnosis not present

## 2019-08-18 DIAGNOSIS — I10 Essential (primary) hypertension: Secondary | ICD-10-CM

## 2019-08-18 DIAGNOSIS — I2510 Atherosclerotic heart disease of native coronary artery without angina pectoris: Secondary | ICD-10-CM | POA: Diagnosis not present

## 2019-08-18 DIAGNOSIS — I428 Other cardiomyopathies: Secondary | ICD-10-CM | POA: Diagnosis not present

## 2019-08-18 DIAGNOSIS — N250 Renal osteodystrophy: Secondary | ICD-10-CM | POA: Diagnosis not present

## 2019-08-18 DIAGNOSIS — I739 Peripheral vascular disease, unspecified: Secondary | ICD-10-CM | POA: Diagnosis not present

## 2019-08-18 DIAGNOSIS — E785 Hyperlipidemia, unspecified: Secondary | ICD-10-CM | POA: Diagnosis not present

## 2019-08-18 DIAGNOSIS — D631 Anemia in chronic kidney disease: Secondary | ICD-10-CM | POA: Diagnosis not present

## 2019-08-18 DIAGNOSIS — E1129 Type 2 diabetes mellitus with other diabetic kidney complication: Secondary | ICD-10-CM | POA: Diagnosis not present

## 2019-08-18 LAB — COMPREHENSIVE METABOLIC PANEL
ALT: 42 U/L (ref 0–44)
AST: 75 U/L — ABNORMAL HIGH (ref 15–41)
Albumin: 2.9 g/dL — ABNORMAL LOW (ref 3.5–5.0)
Alkaline Phosphatase: 41 U/L (ref 38–126)
Anion gap: 14 (ref 5–15)
BUN: 47 mg/dL — ABNORMAL HIGH (ref 6–20)
CO2: 22 mmol/L (ref 22–32)
Calcium: 7.5 mg/dL — ABNORMAL LOW (ref 8.9–10.3)
Chloride: 97 mmol/L — ABNORMAL LOW (ref 98–111)
Creatinine, Ser: 9.25 mg/dL — ABNORMAL HIGH (ref 0.61–1.24)
GFR calc Af Amer: 7 mL/min — ABNORMAL LOW (ref >60)
GFR calc non Af Amer: 6 mL/min — ABNORMAL LOW (ref >60)
Glucose, Bld: 123 mg/dL — ABNORMAL HIGH (ref 70–99)
Potassium: 4.5 mmol/L (ref 3.5–5.1)
Sodium: 133 mmol/L — ABNORMAL LOW (ref 135–145)
Total Bilirubin: 1.2 mg/dL (ref 0.3–1.2)
Total Protein: 6.6 g/dL (ref 6.5–8.1)

## 2019-08-18 LAB — PHOSPHORUS: Phosphorus: 5.4 mg/dL — ABNORMAL HIGH (ref 2.5–4.6)

## 2019-08-18 LAB — CBC WITH DIFFERENTIAL/PLATELET
Abs Immature Granulocytes: 0.03 10*3/uL (ref 0.00–0.07)
Basophils Absolute: 0 10*3/uL (ref 0.0–0.1)
Basophils Relative: 1 %
Eosinophils Absolute: 0 10*3/uL (ref 0.0–0.5)
Eosinophils Relative: 0 %
HCT: 37.1 % — ABNORMAL LOW (ref 39.0–52.0)
Hemoglobin: 11.4 g/dL — ABNORMAL LOW (ref 13.0–17.0)
Immature Granulocytes: 0 %
Lymphocytes Relative: 7 %
Lymphs Abs: 0.5 10*3/uL — ABNORMAL LOW (ref 0.7–4.0)
MCH: 28.3 pg (ref 26.0–34.0)
MCHC: 30.7 g/dL (ref 30.0–36.0)
MCV: 92.1 fL (ref 80.0–100.0)
Monocytes Absolute: 0.5 10*3/uL (ref 0.1–1.0)
Monocytes Relative: 7 %
Neutro Abs: 6.2 10*3/uL (ref 1.7–7.7)
Neutrophils Relative %: 85 %
Platelets: 120 10*3/uL — ABNORMAL LOW (ref 150–400)
RBC: 4.03 MIL/uL — ABNORMAL LOW (ref 4.22–5.81)
RDW: 15.2 % (ref 11.5–15.5)
WBC: 7.3 10*3/uL (ref 4.0–10.5)
nRBC: 0 % (ref 0.0–0.2)

## 2019-08-18 LAB — GLUCOSE, CAPILLARY
Glucose-Capillary: 118 mg/dL — ABNORMAL HIGH (ref 70–99)
Glucose-Capillary: 123 mg/dL — ABNORMAL HIGH (ref 70–99)
Glucose-Capillary: 295 mg/dL — ABNORMAL HIGH (ref 70–99)

## 2019-08-18 LAB — MAGNESIUM: Magnesium: 1.7 mg/dL (ref 1.7–2.4)

## 2019-08-18 LAB — PROCALCITONIN: Procalcitonin: 6.14 ng/mL

## 2019-08-18 MED ORDER — CARVEDILOL 3.125 MG PO TABS
6.2500 mg | ORAL_TABLET | Freq: Two times a day (BID) | ORAL | Status: DC
  Administered 2019-08-18: 6.25 mg via ORAL
  Filled 2019-08-18: qty 2

## 2019-08-18 MED ORDER — INSULIN ASPART 100 UNIT/ML ~~LOC~~ SOLN: 10.0000 [IU] | Freq: Three times a day (TID) | SUBCUTANEOUS | Status: DC

## 2019-08-18 MED ORDER — VANCOMYCIN HCL IN DEXTROSE 1-5 GM/200ML-% IV SOLN: 1000.0000 mg | INTRAVENOUS | Status: DC

## 2019-08-18 MED ORDER — CEFTAZIDIME IV (FOR PTA / DISCHARGE USE ONLY): 2.0000 g | INTRAVENOUS | 0 refills | Status: DC

## 2019-08-18 MED ORDER — CHLORHEXIDINE GLUCONATE CLOTH 2 % EX PADS
6.0000 | MEDICATED_PAD | Freq: Every day | CUTANEOUS | Status: DC
  Administered 2019-08-18: 6 via TOPICAL

## 2019-08-18 MED ORDER — OXYCODONE HCL 5 MG PO TABS
5.0000 mg | ORAL_TABLET | Freq: Four times a day (QID) | ORAL | Status: DC | PRN
  Administered 2019-08-18: 5 mg via ORAL
  Filled 2019-08-18: qty 1

## 2019-08-18 MED ORDER — FENTANYL CITRATE (PF) 100 MCG/2ML IJ SOLN
12.5000 ug | INTRAMUSCULAR | Status: DC | PRN
  Administered 2019-08-18: 12.5 ug via INTRAVENOUS
  Filled 2019-08-18: qty 2

## 2019-08-18 MED ORDER — VANCOMYCIN IV (FOR PTA / DISCHARGE USE ONLY)
2000.0000 mg | INTRAVENOUS | 0 refills | Status: DC
Start: 2019-08-20 — End: 2019-09-04

## 2019-08-18 NOTE — Progress Notes (Signed)
Pharmacy Antibiotic Note  Anthony Bullock is a 59 y.o. male admitted on 08/17/2019 with sepsis.  Pharmacy has been consulted for vancomycin and zosyn dosing.  Plan: Zosyn 3.375g IV q12h (4 hour infusion). vancomycin 15mg /kg x 1 dose, followed by vancomycin 1gm iv qdialysis (tues, thurs, sat)  Height: 5\' 10"  (177.8 cm) Weight: 95.8 kg (211 lb 3.2 oz) IBW/kg (Calculated) : 73  Temp (24hrs), Avg:101.5 F (38.6 C), Min:99.5 F (37.5 C), Max:103.3 F (39.6 C)  Recent Labs  Lab 08/17/19 1257 08/17/19 1454  WBC 9.2  --   CREATININE 6.46*  --   LATICACIDVEN 1.0 0.7    Estimated Creatinine Clearance: 14.7 mL/min (A) (by C-G formula based on SCr of 6.46 mg/dL (H)).    Allergies  Allergen Reactions  . Epoetin Alfa   . Ferumoxytol   . Morphine Sulfate Rash and Other (See Comments)    Itches all over, red spots    Antimicrobials this admission: 8/7 vancomycin >>  8/7 zosyn >>   Microbiology results: 8/7 BCx: sent  8/7 UCx: sent   8/7 Covid 19: negative   Thank you for allowing pharmacy to be a part of this patient's care.  Donna Christen [name] 08/18/2019 7:17 AM

## 2019-08-18 NOTE — Care Management CC44 (Signed)
Condition Code 44 Documentation Completed  Patient Details  Name: Anthony Bullock MRN: [id number] Date of Birth: 03/20/1960   Condition Code 44 given:  Yes Patient signature on Condition Code 44 notice:  Yes Documentation of 2 MD's agreement:  Yes Code 44 added to claim:  Yes    Natasha Bence, LCSW 08/18/2019, 2:01 PM

## 2019-08-18 NOTE — Progress Notes (Signed)
Nsg Discharge Note  Admit Date:  08/17/2019 Discharge date: 08/18/2019   BARCLAY LENNOX to be D/C'd Home per MD order.  AVS completed.  Copy for chart, and copy for patient signed, and dated. Patient/caregiver able to verbalize understanding.  Discharge Medication: Allergies as of 08/18/2019      Reactions   Epoetin Alfa    Ferumoxytol    Morphine Sulfate Rash, Other (See Comments)   Itches all over, red spots      Medication List    TAKE these medications   albuterol 108 (90 Base) MCG/ACT inhaler Commonly known as: VENTOLIN HFA Inhale 2 puffs into the lungs every 4 (four) hours as needed for wheezing or shortness of breath.   aspirin EC 81 MG tablet Take 81 mg by mouth daily.   atorvastatin 80 MG tablet Commonly known as: LIPITOR Take 1 tablet (80 mg total) by mouth at bedtime.   azelastine 0.1 % nasal spray Commonly known as: Astelin Place 2 sprays into both nostrils 3 (three) times daily as needed for rhinitis. Use in each nostril as directed   Basaglar KwikPen 100 UNIT/ML INJECT 70 UNITS SUBCUTANEOUSLY AT BEDTIME   BD Pen Needle Nano U/F 32G X 4 MM Misc Generic drug: Insulin Pen Needle USE 1 PEN NEEDLE SUBCUTANEOUSLY WITH INSULIN 4 TIMES DAILY   carvedilol 12.5 MG tablet Commonly known as: COREG Take 1 tablet (12.5 mg total) by mouth 2 (two) times daily with a meal.   cefTAZidime  IVPB Commonly known as: FORTAZ Inject 2 g into the vein Every Tuesday,Thursday,and Saturday with dialysis. Indication:  bacteremia Last Day of Therapy:  08/31/19 Labs - Once weekly:  CBC/D and BMP, Labs - Every other week:  ESR and CRP Method of administration: IV Push Method of administration may be changed at the discretion of home infusion pharmacist based upon assessment of the patient and/or caregiver's ability to self-administer the medication ordered. Start taking on: August 20, 2019   fenofibrate 160 MG tablet Take 1 tablet (160 mg total) by mouth daily.   fexofenadine 180 MG  tablet Commonly known as: ALLEGRA Take 180 mg by mouth daily.   FreeStyle Libre 14 Day Sensor Misc USE AS DIRECTED EVERY 14 DAYS   furosemide 40 MG tablet Commonly known as: LASIX Take 3 tablets by mouth once daily   gabapentin 300 MG capsule Commonly known as: NEURONTIN Take 2 capsules (600 mg total) by mouth 2 (two) times daily.   glucose blood test strip Check 1 time daily. E11.9 One Touch Ultra Blue Test Strips   insulin lispro 200 UNIT/ML KwikPen Commonly known as: HumaLOG KwikPen Inject 10 Units into the skin 3 (three) times daily with meals. Use tid with meals as per sliding scale   lanthanum 1000 MG chewable tablet Commonly known as: FOSRENOL Chew 1,000 mg by mouth 3 (three) times daily with meals. Take one tablet after each meal.   multivitamin Tabs tablet Take 1 tablet by mouth daily.   Pataday 0.2 % Soln Generic drug: Olopatadine HCl Place 1 drop into both eyes daily as needed (for allergies).   vancomycin  IVPB Inject 2,000 mg into the vein Every Tuesday,Thursday,and Saturday with dialysis. Indication:  bacteremia First Dose: No Last Day of Therapy:  08/31/19 Labs - Sunday/Monday:  CBC/D, BMP, and vancomycin trough. Labs - Thursday:  BMP and vancomycin trough Labs - Every other week:  ESR and CRP Method of administration:Elastomeric Method of administration may be changed at the discretion of the patient and/or caregiver's  ability to self-administer the medication ordered. Start taking on: August 20, 2019            Discharge Care Instructions  (From admission, onward)         Start     Ordered   08/18/19 0000  Change dressing on IV access line weekly and PRN  (Home infusion instructions - [facility] )        08/18/19 1359          Discharge Assessment: Vitals:   08/18/19 1300 08/18/19 1400  BP: (!) 167/55 (!) 176/62  Pulse: 83 86  Resp: 19 19  Temp:    SpO2: 97% 98%   Skin clean, dry and intact without evidence of skin  break down, no evidence of skin tears noted. IV catheter discontinued intact. Site without signs and symptoms of complications - no redness or edema noted at insertion site, patient denies c/o pain - only slight tenderness at site.  Dressing with slight pressure applied.  D/c Instructions-Education: Discharge instructions given to patient/family with verbalized understanding. D/c education completed with patient/family including follow up instructions, medication list, d/c activities limitations if indicated, with other d/c instructions as indicated by MD - patient able to verbalize understanding, all questions fully answered. Patient instructed to return to ED, call 911, or call MD for any changes in condition.  Patient escorted via [place], and D/C home via private auto.  Carney Corners, RN 08/18/2019 2:54 PM

## 2019-08-18 NOTE — Progress Notes (Addendum)
PHARMACY CONSULT NOTE FOR:  OUTPATIENT  PARENTERAL ANTIBIOTIC THERAPY (OPAT)  Indication: bacteremia Regimen: ceftazidime 2gm iv q tues thurs sat dialysis & vancomycin 2gm q tues thurs sat dialysis. Spoke with Dr Wynetta Emery who states Dr Moshe Cipro would like 2gm dosing of each fortaz and vancomycin with HD she will follow patient outpatient. Entered orders accordingly.  End date: 08/31/19  IV antibiotic discharge orders are pended. To discharging provider:  please sign these orders via discharge navigator,  Select New Orders & click on the button choice - Manage This Unsigned Work.     Thank you for allowing pharmacy to be a part of this patient's care.  Donna Christen Brenson Hartman 08/18/2019, 1:45 PM

## 2019-08-18 NOTE — Progress Notes (Addendum)
PROGRESS NOTE   Anthony Bullock  LOV:[id number] DOB: 15-Dec-1960 DOA: 08/17/2019 PCP: [name], MD   Chief Complaint  Patient presents with  . Tremors   Brief Admission History:  59 y.o. male with medical history significant for CAD, systolic CHF, type 2 DM, HLD, ESRD on HD T,R,S who had an uneventful HD treatment today. He went home and started having fever and chills ([place]).  His temperature was up to 103 at home.  He was also having some confusion when his temperature was high.  He was admitted with SIRS and a febrile illness with no known source of infection found.    Assessment & Plan:   Principal Problem:   Febrile illness Active Problems:   Poorly controlled type II diabetes mellitus with renal complication (HCC)   Hyperlipidemia   Essential hypertension   Diabetic peripheral neuropathy (HCC)   COPD (chronic obstructive pulmonary disease) (HCC)   Chronic systolic congestive heart failure (HCC)   CAD (coronary artery disease)   NICM (nonischemic cardiomyopathy) (HCC)   Osteoarthritis of multiple joints   Osteoarthritis of both knees   At high risk for falls   Peripheral vascular disease (HCC)   ESRD (end stage renal disease) on dialysis ([place])   Anemia due to chronic kidney disease   Status post unilateral below knee amputation, right (HCC)   SIRS (systemic inflammatory response syndrome) (HCC)   Thrombocytopenia (HCC)   Pressure injury of skin  1. SIRS - Pt presented with fever and tachycardia, no source of infection has been found. Repeat CXR this morning still pending.  Pt unable to give a urinalysis and says his lasix tablets don't make him produce urine.  Follow blood cultures.  No growth to date.  Continue broad spectrum antibiotics.  DC vancomycin as MRSA screen is negative.  Procalcitonin level remains elevated.  I asked nephrology to consult to help investigate if his dialysis catheter is a source of infection. 2. Febrile illness - cause unknown at this  time, temp coming down, treat supportively. Sars 2 coronavirus test fortunately is negative.  Repeat CXR pending.  3. ESRD on HD - Pt completed a full HD treatment 8/7.  He is on a Tue, Thu, Sat scheduled.  I have consulted nephrology to assist with inpatient management.  4. Left heel/foot wound - From my exam I do not see any lesion that looks actively infected to cause this presentation.  I have ordered left heel protection.  He will need outpatient follow up with  Dr. Meridee Score.  5. Anemia / thrombocytopenia - stable, follow.  6. PVD - outpatient vascular surgery follow up.  7. Essential hypertension - we have reduced some home BP meds due to initial soft BPs. Pt was given IV fluid boluses in ED. As BP recovers, would resume home med doses.  8. Chronic systolic HF/NICM - Recently seen by Dr. Harl Bowie. Most recent echo 8/17 LFEF  Had improved from 25% to 50-55%.   He has an ICD.  Dry weight reportedly 92.5 kg.  Temporarily holding home lasix due to soft BPs although patient says lasix does not make him urinate anymore.   9. CAD - from cath 2015 he reportedly had diffuse nonobstructive disease.  10. Type 2 DM with vascular complications - he is not eating well now, we have reduced his insulin doses by 50% until he starts eating better, titrate doses as needed, monitor CBG 5 times per day.   DVT prophylaxis: sQ heparin  Code Status: full  Family Communication: wife at bedside 8/7 Disposition Plan: home   Consults called: nephrology (to see 8/8)   Admission status: INP   Status is: Inpatient  Remains inpatient appropriate because:Ongoing diagnostic testing needed not appropriate for outpatient work up and IV treatments appropriate due to intensity of illness or inability to take PO  Dispo:  Patient From: Home  Planned Disposition: Home  Expected discharge date: 08/20/19  Medically stable for discharge: No  Consultants:   Nephrology   Procedures:     Antimicrobials:  Zosyn 8/7 >>    Vancomycin 8/6>8/7   Subjective: Pt says he feels better, already asking about going home.  No chills today.   Objective: Vitals:   08/18/19 0600 08/18/19 0700 08/18/19 0800 08/18/19 0900  BP: (!) 171/48 (!) 147/42 (!) 140/48 (!) 153/54  Pulse: 93 88 89 84  Resp: (!) 21 16 17  (!) 26  Temp:      TempSrc:      SpO2: 96% 97% 96% 96%  Weight:      Height:        Intake/Output Summary (Last 24 hours) at 08/18/2019 0936 Last data filed at 08/18/2019 0856 Gross per 24 hour  Intake 2780.89 ml  Output --  Net 2780.89 ml   Filed Weights   08/17/19 1253 08/18/19 0500  Weight: 96.6 kg 95.8 kg    Examination:  General exam: Appears calm and comfortable  Respiratory system: dialysis catheter right anterior chest wall. Clear to auscultation. Respiratory effort normal. Cardiovascular system: normal S1 & S2 heard. No JVD, murmurs, rubs, gallops or clicks. No pedal edema. Gastrointestinal system: Abdomen is nondistended, soft and nontender. No organomegaly or masses felt. Normal bowel sounds heard. Central nervous system: Alert and oriented. No focal neurological deficits. Skin: No rashes, lesions or ulcers Psychiatry: Judgement and insight appear normal. Mood & affect appropriate.  Musculoskeletal: Right BKA with prosthesis in place, no clubbing / cyanosis. No joint deformity upper and lower extremities. Good ROM, no contractures. Normal muscle tone.  Skin: Full body skin exam performed, he has healed buttock pressure wounds, no drainage or rash seen, he has pressure callus on lateral left foot, it is not open or draining and does not appear to be infected, he has a heal wound, it is dry, it does not appear to be infected.  No other rashes, lesions, ulcers. No induration  Data Reviewed: I have personally reviewed following labs and imaging studies  CBC: Recent Labs  Lab 08/17/19 1257 08/18/19 0620  WBC 9.2 7.3  NEUTROABS 8.3* 6.2  HGB 12.7* 11.4*  HCT 40.0 37.1*  MCV 89.7 92.1  PLT  138* 120*    Basic Metabolic Panel: Recent Labs  Lab 08/17/19 1257  NA 133*  K 4.0  CL 96*  CO2 25  GLUCOSE 168*  BUN 29*  CREATININE 6.46*  CALCIUM 8.4*    GFR: Estimated Creatinine Clearance: 14.7 mL/min (A) (by C-G formula based on SCr of 6.46 mg/dL (H)).  Liver Function Tests: Recent Labs  Lab 08/17/19 1257  AST 32  ALT 29  ALKPHOS 60  BILITOT 0.7  PROT 8.1  ALBUMIN 3.9    CBG: Recent Labs  Lab 08/17/19 1554 08/17/19 2132 08/18/19 0222 08/18/19 0739  GLUCAP 153* 120* 118* 123*    Recent Results (from the past 240 hour(s))  SARS Coronavirus 2 by RT PCR (hospital order, performed in [facility] hospital lab) Nasopharyngeal Nasopharyngeal Swab     Status: None   Collection Time: 08/17/19 12:52 PM  Specimen: Nasopharyngeal Swab  Result Value Ref Range Status   SARS Coronavirus 2 NEGATIVE NEGATIVE Final    Comment: (NOTE) SARS-CoV-2 target nucleic acids are NOT DETECTED.  The SARS-CoV-2 RNA is generally detectable in upper and lower respiratory specimens during the acute phase of infection. The lowest concentration of SARS-CoV-2 viral copies this assay can detect is 250 copies / mL. A negative result does not preclude SARS-CoV-2 infection and should not be used as the sole basis for treatment or other patient management decisions.  A negative result may occur with improper specimen collection / handling, submission of specimen other than nasopharyngeal swab, presence of viral mutation(s) within the areas targeted by this assay, and inadequate number of viral copies (<250 copies / mL). A negative result must be combined with clinical observations, patient history, and epidemiological information.  Fact Sheet for Patients:   StrictlyIdeas.no  Fact Sheet for Healthcare Providers: BankingDealers.co.za  This test is not yet approved or  cleared by the [place] FDA and has been authorized for detection  and/or diagnosis of SARS-CoV-2 by FDA under an Emergency Use Authorization (EUA).  This EUA will remain in effect (meaning this test can be used) for the duration of the COVID-19 declaration under Section 564(b)(1) of the Act, 21 U.S.C. section 360bbb-3(b)(1), unless the authorization is terminated or revoked sooner.  Performed at [facility], [address] [place], [place] [address]   Blood culture (routine single)     Status: None (Preliminary result)   Collection Time: 08/17/19 12:57 PM   Specimen: Right Antecubital; Blood  Result Value Ref Range Status   Specimen Description RIGHT ANTECUBITAL BOTTLES DRAWN AEROBIC ONLY  Final   Special Requests   Final    Blood Culture adequate volume Performed at [facility], [address], [place], [place] [address]    Culture PENDING  Incomplete   Report Status PENDING  Incomplete  MRSA PCR Screening     Status: None   Collection Time: 08/17/19  3:40 PM   Specimen: Nasal Mucosa; Nasopharyngeal  Result Value Ref Range Status   MRSA by PCR NEGATIVE NEGATIVE Final    Comment:        The GeneXpert MRSA Assay (FDA approved for NASAL specimens only), is one component of a comprehensive MRSA colonization surveillance program. It is not intended to diagnose MRSA infection nor to guide or monitor treatment for MRSA infections. Performed at [facility], [address], [place], [place] [address]    Radiology Studies: [facility] Chest [facility] 1 View  Result Date: 08/17/2019 CLINICAL DATA:  Dialysis for possible sepsis. EXAM: PORTABLE CHEST 1 VIEW COMPARISON:  07/01/2019 FINDINGS: Patient has RIGHT-sided dialysis catheter, tip overlying the UPPER RIGHT atrium. Stable appearance of subcutaneous subdural later bleeding. Heart size is mildly enlarged and stable in configuration. Minimal subsegmental atelectasis at the LEFT lung base. There has been significant improvement in the appearance of lungs since prior study. IMPRESSION: 1. Stable cardiomegaly. 2.  Improved aeration. Electronically Signed   By: Nolon Nations M.D.   On: 08/17/2019 13:14   Scheduled Meds: . aspirin EC  81 mg Oral Daily  . atorvastatin  80 mg Oral QHS  . carvedilol  6.25 mg Oral BID WC  . Chlorhexidine Gluconate Cloth  6 each Topical Daily  . fenofibrate  160 mg Oral Daily  . heparin  5,000 Units Subcutaneous Q8H  . insulin aspart  0-5 Units Subcutaneous QHS  . insulin aspart  0-6 Units Subcutaneous TID WC  . insulin aspart  5 Units Subcutaneous TID WC  . insulin glargine  30 Units Subcutaneous Q2200  . multivitamin  1 tablet Oral Daily   Continuous Infusions: . piperacillin-tazobactam (ZOSYN)  IV 3.375 g (08/18/19 0801)    LOS: 1 day   Critical Care Procedure Note Authorized and Performed by: Murvin Natal MD  Total Critical Care time:  30 mins Due to a high probability of clinically significant, life threatening deterioration, the patient required my highest level of preparedness to intervene emergently and I personally spent this critical care time directly and personally managing the patient.  This critical care time included obtaining a history; examining the patient, pulse oximetry; ordering and review of studies; arranging urgent treatment with development of a management plan; evaluation of patient's response of treatment; frequent reassessment; and discussions with other providers.  This critical care time was performed to assess and manage the high probability of imminent and life threatening deterioration that could result in multi-organ failure.  It was exclusive of separately billable procedures and treating other patients and teaching time.   Irwin Brakeman, MD How to contact the [facility] Attending or Consulting provider [place] or covering provider during after hours [place], for this patient?  1. Check the care team in [facility] and look for a) attending/consulting [facility] provider listed and b) the [facility] team listed 2. Log into [email or web address] and use [facility]'s  universal password to access. If you do not have the password, please contact the hospital operator. 3. Locate the [facility] provider you are looking for under [facility] and page to a number that you can be directly reached. 4. If you still have difficulty reaching the provider, please page the [facility] (Director on Call) for the Hospitalists listed on amion for assistance.  08/18/2019, 9:36 AM

## 2019-08-18 NOTE — Discharge Summary (Signed)
Physician Discharge Summary  Anthony Bullock HQR:[id number] DOB: 03-12-1960 DOA: 08/17/2019  PCP: [name], MD Nephrologist: Theador Hawthorne  Admit date: 08/17/2019 Discharge date: 08/18/2019  Admitted From:   Home  Disposition:  Home   Recommendations for Outpatient Follow-up:  1. Resume regular HD schedule with [facility] 2. 2gm Fortaz and 2gm vanc to be given with each HD treatment until cultures back  Discharge Condition: STABLE   CODE STATUS: FULL    Brief Hospitalization Summary: Please see all hospital notes, images, labs for full details of the hospitalization. ADMISSION HPI: Anthony Bullock is a 59 y.o. male with medical history significant for CAD, systolic CHF, type 2 DM, HLD, ESRD on HD T,R,S who had an uneventful HD treatment today. He went home and started having fever and chills (rigors).  His temperature was up to 103 at home.  He was also having some confusion when his temperature was high.  He had been feeling his normal self prior to going to HD today except that he complained of a painful callous on his left foot that hurt when walking.  He was formerly seen by Dr. Lowanda Foster for nephrology care and now is followed by Dr. Theador Hawthorne. He remains hopeful for a renal transplant.  For some reason all prior attempts for him to get a successful AV fistula have failed and he has been using a tunneled catheter in right chest wall for at least the last year. He says that he has been careful to keep it clean and the dialysis nurses have been careful to use good technique when accessing it for his HD treatments.  He was treated for a pneumonia in June 2021 but has not had any respiratory symptoms since recovering from that surgery.   He has longstanding peripheral vascular disease and is s/p right BKA by Dr. Meridee Score in 2018.   ED Course: Pt arrived febrile with temp of 103.3, pulse 109, BP 157/35, pulse ox 94% on room air, BMP 30.56, His labs were stable after HD today, K 4.0, Glucose 168, Na 133,  BUN 29, Creatinine 6.46, Lactic acid 1.0, procalcitonin 2.61, WBC 9.2, Hg 12.7, platelet 138, PT 14.0, INR 1.1, aPTT32, Sars 2 Coronavirus test negative, CXR no acute findings. UA pending. Blood cultures obtained.  Pt was started on broad spectrum IV antibiotics. No clear source of infection found.  Pt admitted for a febrile illness with SIRS.      Pt was admitted for an acute febrile illness with fever and SIRS symptoms.  No source of infection was found.  He was treated empirically with Zosyn and vancomycin.  He was given oral hydration.  He responded well to these treatments.  He is feeling much better.  He wants to go home.  I was concerned about his [facility] as being the source of the infection I asked for nephrology consultation.  Dr. Moshe Cipro consulted and evaluated him and did not feel that it was infected at this time.  She did recommend that he receive Fortaz and vancomycin with his HD treatments until the cultures are back.  She recommended 2 g of Fortaz and 2 g of vancomycin with each treatment.  She said that when the cultures are back antibiotics can be narrowed or discontinued based on the results.  If he has recurrent febrile illness then he may have to have his TDC removed.  At this time the patient is hemodynamically stable feeling better eating and drinking well and wants to discharge home.  He will remain on his regular hemodialysis schedule.  OPAT orders placed and I also spoke with our dialysis nurse Levada Dy who will call the [place] dialysis center tomorrow to make sure that the arrangements are made for him to receive the antibiotics during his next treatment on Tuesday.  I confirmed with Dr. Moshe Cipro as well.  The patient is being discharged home in stable condition.  Discharge Diagnoses:  Principal Problem:   Febrile illness Active Problems:   Poorly controlled type II diabetes mellitus with renal complication (HCC)   Hyperlipidemia   Essential hypertension   Diabetic peripheral  neuropathy (HCC)   COPD (chronic obstructive pulmonary disease) (HCC)   Chronic systolic congestive heart failure (HCC)   CAD (coronary artery disease)   NICM (nonischemic cardiomyopathy) (HCC)   Osteoarthritis of multiple joints   Osteoarthritis of both knees   At high risk for falls   Peripheral vascular disease (HCC)   ESRD (end stage renal disease) on dialysis ([place])   Anemia due to chronic kidney disease   Status post unilateral below knee amputation, right (HCC)   SIRS (systemic inflammatory response syndrome) (HCC)   Thrombocytopenia (HCC)   Pressure injury of skin   Discharge Instructions: Discharge Instructions    [facility] pharmacist to adjust dose for Vancomycin, Aminoglycosides and other anti-infective therapies as requested by physician.   Complete by: As directed    [facility] to provide Cath Flo 75m   Complete by: As directed    Administer for PICC line occlusion and as ordered by physician for other access device issues.   Anaphylaxis Kit: Provided to treat any anaphylactic reaction to the medication being provided to the patient if First Dose or when requested by physician   Complete by: As directed    Epinephrine 190mml vial / amp: Administer 0.12m50m0.12ml15mubcutaneously once for moderate to severe anaphylaxis, nurse to call physician and pharmacy when reaction occurs and call 911 if needed for immediate care   Diphenhydramine 50mg50mIV vial: Administer 25-50mg 9mM PRN for first dose reaction, rash, itching, mild reaction, nurse to call physician and pharmacy when reaction occurs   Sodium Chloride 0.9% NS 500ml I76mdminister if needed for hypovolemic blood pressure drop or as ordered by physician after call to physician with anaphylactic reaction   Change dressing on IV access line weekly and PRN   Complete by: As directed    Flush IV access with Sodium Chloride 0.9% and Heparin 10 units/ml or 100 units/ml   Complete by: As directed     Home infusion instructions - [facility]   Complete by: As directed    Instructions: Flush IV access with Sodium Chloride 0.9% and Heparin 10units/ml or 100units/ml   Change dressing on IV access line: Weekly and PRN   Instructions Cath Flo 2mg: Ad37mister for PICC Line occlusion and as ordered by physician for other access device   [facility] pharmacist to adjust dose for: Vancomycin, Aminoglycosides and other anti-infective therapies as requested by physician   Method of administration may be changed at the discretion of home infusion pharmacist based upon assessment of the patient and/or caregiver's ability to self-administer the medication ordered   Complete by: As directed      Allergies as of 08/18/2019      Reactions   Epoetin Alfa    Ferumoxytol    Morphine Sulfate Rash, Other (See Comments)   Itches all over, red spots      Medication List  TAKE these medications   albuterol 108 (90 Base) MCG/ACT inhaler Commonly known as: VENTOLIN HFA Inhale 2 puffs into the lungs every 4 (four) hours as needed for wheezing or shortness of breath.   aspirin EC 81 MG tablet Take 81 mg by mouth daily.   atorvastatin 80 MG tablet Commonly known as: LIPITOR Take 1 tablet (80 mg total) by mouth at bedtime.   azelastine 0.1 % nasal spray Commonly known as: Astelin Place 2 sprays into both nostrils 3 (three) times daily as needed for rhinitis. Use in each nostril as directed   Basaglar KwikPen 100 UNIT/ML INJECT 70 UNITS SUBCUTANEOUSLY AT BEDTIME   BD Pen Needle Nano U/F 32G X 4 MM Misc Generic drug: Insulin Pen Needle USE 1 PEN NEEDLE SUBCUTANEOUSLY WITH INSULIN 4 TIMES DAILY   carvedilol 12.5 MG tablet Commonly known as: COREG Take 1 tablet (12.5 mg total) by mouth 2 (two) times daily with a meal.   cefTAZidime  IVPB Commonly known as: FORTAZ Inject 2 g into the vein Every Tuesday,Thursday,and Saturday with dialysis. Indication:  bacteremia Last Day of  Therapy:  08/31/19 Labs - Once weekly:  CBC/D and BMP, Labs - Every other week:  ESR and CRP Method of administration: IV Push Method of administration may be changed at the discretion of home infusion pharmacist based upon assessment of the patient and/or caregiver's ability to self-administer the medication ordered. Start taking on: August 20, 2019   fenofibrate 160 MG tablet Take 1 tablet (160 mg total) by mouth daily.   fexofenadine 180 MG tablet Commonly known as: ALLEGRA Take 180 mg by mouth daily.   FreeStyle Libre 14 Day Sensor Misc USE AS DIRECTED EVERY 14 DAYS   furosemide 40 MG tablet Commonly known as: LASIX Take 3 tablets by mouth once daily   gabapentin 300 MG capsule Commonly known as: NEURONTIN Take 2 capsules (600 mg total) by mouth 2 (two) times daily.   glucose blood test strip Check 1 time daily. E11.9 One Touch Ultra Blue Test Strips   insulin lispro 200 UNIT/ML KwikPen Commonly known as: HumaLOG KwikPen Inject 10 Units into the skin 3 (three) times daily with meals. Use tid with meals as per sliding scale   lanthanum 1000 MG chewable tablet Commonly known as: FOSRENOL Chew 1,000 mg by mouth 3 (three) times daily with meals. Take one tablet after each meal.   multivitamin Tabs tablet Take 1 tablet by mouth daily.   Pataday 0.2 % Soln Generic drug: Olopatadine HCl Place 1 drop into both eyes daily as needed (for allergies).   vancomycin  IVPB Inject 2,000 mg into the vein Every Tuesday,Thursday,and Saturday with dialysis. Indication:  bacteremia First Dose: No Last Day of Therapy:  08/31/19 Labs - _0 /08/21 1359          Follow-up Information  [name], MD Follow up.   Specialty: Family Medicine Contact information: [place] [address] [phone]        Arnoldo Lenis, MD .   Specialty: Cardiology Contact information: [place] [address] [phone]        Deboraha Sprang, MD .   Specialty: Cardiology Contact information: [phone] [address] [place] [address] [phone]        Liana Gerold, MD Follow up.   Specialty: Nephrology Contact information: [address] [place] [address] [phone]        [facility] Follow up.   Contact information: [place] [address] [phone]              Allergies  Allergen Reactions  . Epoetin Alfa   . Ferumoxytol   . Morphine Sulfate Rash and Other (See Comments)    Itches all over, red spots   Allergies as of 08/18/2019      Reactions   Epoetin Alfa    Ferumoxytol    Morphine Sulfate Rash, Other (See Comments)   Itches all over, red spots      Medication List    TAKE these medications   albuterol 108 (90 Base) MCG/ACT inhaler Commonly known as: VENTOLIN HFA Inhale 2 puffs into the lungs every 4 (four) hours as needed for wheezing or shortness of breath.   aspirin EC 81 MG tablet Take 81 mg by mouth daily.   atorvastatin 80 MG tablet Commonly known as: LIPITOR Take 1 tablet (80 mg total) by mouth at bedtime.   azelastine 0.1 % nasal spray Commonly known as: Astelin Place 2 sprays into both nostrils 3 (three) times daily as needed for rhinitis. Use in each nostril as directed   Basaglar KwikPen 100 UNIT/ML INJECT 70  UNITS SUBCUTANEOUSLY AT BEDTIME   BD Pen Needle Nano U/F 32G X 4 MM Misc Generic drug: Insulin Pen Needle USE 1 PEN NEEDLE SUBCUTANEOUSLY WITH INSULIN 4 TIMES DAILY   carvedilol 12.5 MG tablet Commonly known as: COREG Take 1 tablet (12.5 mg total) by mouth 2 (two) times daily with a meal.   cefTAZidime  IVPB Commonly known as: FORTAZ Inject 2 g into the vein Every Tuesday,Thursday,and Saturday with dialysis. Indication:  bacteremia Last Day of Therapy:  08/31/19 Labs - Once weekly:  CBC/D and BMP, Labs - Every other week:  ESR and CRP Method of administration: IV Push Method of administration may be changed at the discretion of home infusion pharmacist based upon assessment of the patient and/or caregiver's ability to self-administer the medication ordered. Start taking on: August 20, 2019   fenofibrate 160 MG tablet Take 1 tablet (160 mg total) by mouth daily.   fexofenadine 180 MG tablet Commonly known as: ALLEGRA Take 180 mg by mouth daily.   FreeStyle Libre 14 Day Sensor Misc USE AS DIRECTED EVERY 14 DAYS   furosemide 40 MG tablet Commonly known as: LASIX Take 3 tablets by mouth once daily   gabapentin 300 MG capsule Commonly known as: NEURONTIN Take 2 capsules (600 mg total) by mouth 2 (two) times daily.   glucose blood test strip Check 1 time daily. E11.9 One Touch Ultra Blue Test Strips   insulin lispro 200 UNIT/ML KwikPen Commonly known as: HumaLOG KwikPen Inject 10 Units into the skin 3 (three) times daily with meals. Use tid with meals as per sliding scale  lanthanum 1000 MG chewable tablet Commonly known as: FOSRENOL Chew 1,000 mg by mouth 3 (three) times daily with meals. Take one tablet after each meal.   multivitamin Tabs tablet Take 1 tablet by mouth daily.   Pataday 0.2 % Soln Generic drug: Olopatadine HCl Place 1 drop into both eyes daily as needed (for allergies).   vancomycin  IVPB Inject 2,000 mg into the vein Every Tuesday,Thursday,and  Saturday with dialysis. Indication:  bacteremia First Dose: No Last Day of Therapy:  08/31/19 Labs - _0 /08/21 1359          Procedures/Studies: DG Chest Port 1 View  Addendum Date: 08/18/2019   ADDENDUM REPORT: 08/18/2019 09:43 ADDENDUM: Corrected report: FINDINGS: Patient has RIGHT-sided dialysis catheter, tip overlying the UPPER RIGHT atrium. Stable appearance of subcutaneous defibrillator lead. Electronically Signed   By: Nolon Nations M.D.   On: 08/18/2019 09:43   Result Date: 08/18/2019 CLINICAL DATA:  Dialysis for possible sepsis. EXAM: PORTABLE CHEST 1 VIEW COMPARISON:  07/01/2019 FINDINGS: Patient has RIGHT-sided dialysis catheter, tip overlying the UPPER RIGHT atrium. Stable appearance of subcutaneous subdural later bleeding. Heart size is mildly enlarged and stable in configuration. Minimal subsegmental atelectasis at the LEFT lung base. There has been significant improvement in the appearance of lungs since prior study. IMPRESSION: 1. Stable cardiomegaly. 2. Improved aeration. Electronically Signed: By: Nolon Nations M.D. On: 08/17/2019 13:14   DG CHEST PORT 1 VIEW  Result Date: 08/18/2019 CLINICAL DATA:  Fever.  Dialysis patient. EXAM: PORTABLE CHEST 1 VIEW COMPARISON:  08/17/2019 FINDINGS: RIGHT-sided dialysis catheter tip overlies the superior vena cava. Subcutaneous defibrillator lead is stable in appearance  accounting for differences in position. Heart is enlarged and stable in configuration. There is minimal subsegmental atelectasis at the LEFT lung base. No new consolidations. IMPRESSION: 1. Stable cardiomegaly. 2. No new consolidations. Electronically Signed   By: Nolon Nations M.D.   On: 08/18/2019 09:42      Subjective: Pt says he feels much better and wants to go. Nephrology made arrangements for him to have antibiotics during HD until cultures back.   Discharge Exam: Vitals:   08/18/19 1230 08/18/19 1300  BP: (!) 176/57 (!) 167/55  Pulse: 85 83  Resp: (!) 23 19  Temp: 99.4 F (37.4 C)   SpO2: 99% 97%   Vitals:   08/18/19 1200 08/18/19 1230 08/18/19 1300 08/18/19 1354  BP: (!) 177/59 (!) 176/57 (!) 167/55   Pulse: 87 85 83   Resp: 20 (!) 23 19   Temp:  99.4 F (37.4 C)    TempSrc:  Oral  Oral  SpO2: 98% 99% 97%   Weight:      Height:       General exam: Appears calm and comfortable  Respiratory system: dialysis catheter right anterior chest wall. Clear to auscultation. Respiratory effort normal.  Cardiovascular system: normal S1 & S2 heard. No JVD, murmurs, rubs, gallops or clicks. No pedal edema. Gastrointestinal system: Abdomen is nondistended, soft and nontender. No organomegaly or masses felt. Normal bowel sounds heard. Central nervous system: Alert and oriented. No focal neurological deficits. Skin: No rashes, lesions or ulcers Psychiatry: Judgement and insight appear normal. Mood & affect appropriate.  Musculoskeletal:Right BKA with prosthesis in place,no clubbing / cyanosis. No joint deformity upper and lower extremities. Good ROM, no contractures. Normal muscle tone.  Skin:Full body skin exam performed, he has healed buttock pressure wounds, no drainage or rash seen, he has pressure callus on lateral left foot, it is not open or draining and does not appear to be infected, he has a heal wound, it is dry, it does not appear to be infected. No other rashes,  lesions, ulcers. No induration   The results of significant diagnostics from this hospitalization (including imaging, microbiology, ancillary and laboratory) are listed below for reference.     Microbiology: Recent Results (from the past 240 hour(s))  SARS Coronavirus 2 by RT PCR (hospital order, performed in [facility] hospital lab) Nasopharyngeal Nasopharyngeal Swab     Status: None   Collection Time: 08/17/19 12:52 PM   Specimen: Nasopharyngeal Swab  Result Value Ref Range Status   SARS Coronavirus 2 NEGATIVE NEGATIVE Final    Comment: (NOTE) SARS-CoV-2 target nucleic acids are NOT DETECTED.  The SARS-CoV-2 RNA is generally detectable in upper and lower respiratory specimens during the acute phase of infection. The lowest concentration of SARS-CoV-2 viral copies this assay can detect is 250 copies / mL. A negative result does not preclude SARS-CoV-2 infection and should not be used as the sole basis for treatment or other patient management decisions.  A negative result may occur with improper specimen collection / handling, submission of specimen other than nasopharyngeal swab, presence of viral mutation(s) within the areas targeted by this assay, and inadequate number of viral copies (<250 copies / mL). A negative result must be combined with clinical observations, patient history, and epidemiological information.  Fact Sheet for Patients:   StrictlyIdeas.no  Fact Sheet for Healthcare Providers: BankingDealers.co.za  This test is not yet approved or  cleared by the [place] FDA and has been authorized for detection and/or diagnosis of SARS-CoV-2 by FDA under an Emergency Use Authorization (EUA).  This EUA will remain in effect (meaning this test can be used) for the duration of the COVID-19 declaration under Section 564(b)(1) of the Act, 21 U.S.C. section 360bbb-3(b)(1), unless the authorization is terminated or revoked  sooner.  Performed at [facility], [address], [place], [place] [address]   Blood culture (routine single)     Status: None (Preliminary result)   Collection Time: 08/17/19 12:57 PM   Specimen: Right Antecubital; Blood  Result Value Ref Range Status   Specimen Description RIGHT ANTECUBITAL BOTTLES DRAWN AEROBIC ONLY  Final   Special Requests   Final    Blood Culture adequate volume Performed at [facility], [address] [place], [place] [address]    Culture PENDING  Incomplete   Report Status PENDING  Incomplete  MRSA PCR Screening     Status: None   Collection Time: 08/17/19  3:40 PM   Specimen: Nasal Mucosa; Nasopharyngeal  Result Value Ref Range Status   MRSA by PCR NEGATIVE NEGATIVE Final    Comment:        The GeneXpert MRSA Assay (FDA approved for NASAL specimens only), is one component of a  comprehensive MRSA colonization surveillance program. It is not intended to diagnose MRSA infection nor to guide or monitor treatment for MRSA infections. Performed at [facility], [address], [place], [place] [address]      Labs: BNP (last 3 results) Recent Labs    07/01/19 1006  BNP 938.1*   Basic Metabolic Panel: Recent Labs  Lab 08/17/19 1257 08/18/19 0620  NA 133* 133*  K 4.0 4.5  CL 96* 97*  CO2 25 22  GLUCOSE 168* 123*  BUN 29* 47*  CREATININE 6.46* 9.25*  CALCIUM 8.4* 7.5*  MG  --  1.7  PHOS  --  5.4*   Liver Function Tests: Recent Labs  Lab 08/17/19 1257 08/18/19 0620  AST 32 75*  ALT 29 42  ALKPHOS 60 41  BILITOT 0.7 1.2  PROT 8.1 6.6  ALBUMIN 3.9 2.9*   No results for input(s): LIPASE, AMYLASE in the last 168 hours. No results for input(s): AMMONIA in the last 168 hours. CBC: Recent Labs  Lab 08/17/19 1257 08/18/19 0620  WBC 9.2 7.3  NEUTROABS 8.3* 6.2  HGB 12.7* 11.4*  HCT 40.0 37.1*  MCV 89.7 92.1  PLT 138* 120*   Cardiac Enzymes: No results for input(s): CKTOTAL, CKMB, CKMBINDEX, TROPONINI in the last 168  hours. BNP: Invalid input(s): POCBNP CBG: Recent Labs  Lab 08/17/19 1554 08/17/19 2132 08/18/19 0222 08/18/19 0739 08/18/19 1132  GLUCAP 153* 120* 118* 123* 295*   D-Dimer No results for input(s): DDIMER in the last 72 hours. Hgb A1c No results for input(s): HGBA1C in the last 72 hours. Lipid Profile No results for input(s): CHOL, HDL, LDLCALC, TRIG, CHOLHDL, LDLDIRECT in the last 72 hours. Thyroid function studies No results for input(s): TSH, T4TOTAL, T3FREE, THYROIDAB in the last 72 hours.  Invalid input(s): FREET3 Anemia work up No results for input(s): VITAMINB12, FOLATE, FERRITIN, TIBC, IRON, RETICCTPCT in the last 72 hours. Urinalysis    Component Value Date/Time   COLORURINE yellow 09/28/2009 1317   APPEARANCEUR Clear 09/28/2009 1317   LABSPEC 1.010 09/28/2009 1317   PHURINE 5.0 09/28/2009 1317   HGBUR 2+ 09/28/2009 1317   BILIRUBINUR negative 03/17/2016 1648   PROTEINUR trace 03/17/2016 1648   UROBILINOGEN 0.2 03/17/2016 1648   UROBILINOGEN 0.2 09/28/2009 1317   NITRITE negative 03/17/2016 1648   NITRITE negative 09/28/2009 1317   LEUKOCYTESUR small (1+) (A) 03/17/2016 1648   Sepsis Labs Invalid input(s): PROCALCITONIN,  WBC,  LACTICIDVEN Microbiology Recent Results (from the past 240 hour(s))  SARS Coronavirus 2 by RT PCR (hospital order, performed in [place] hospital lab) Nasopharyngeal Nasopharyngeal Swab     Status: None   Collection Time: 08/17/19 12:52 PM   Specimen: Nasopharyngeal Swab  Result Value Ref Range Status   SARS Coronavirus 2 NEGATIVE NEGATIVE Final    Comment: (NOTE) SARS-CoV-2 target nucleic acids are NOT DETECTED.  The SARS-CoV-2 RNA is generally detectable in upper and lower respiratory specimens during the acute phase of infection. The lowest concentration of SARS-CoV-2 viral copies this assay can detect is 250 copies / mL. A negative result does not preclude SARS-CoV-2 infection and should not be used as the sole basis for  treatment or other patient management decisions.  A negative result may occur with improper specimen collection / handling, submission of specimen other than nasopharyngeal swab, presence of viral mutation(s) within the areas targeted by this assay, and inadequate number of viral copies (<250 copies / mL). A negative result must be combined with clinical observations, patient history,  and epidemiological information.  Fact Sheet for Patients:   StrictlyIdeas.no  Fact Sheet for Healthcare Providers: BankingDealers.co.za  This test is not yet approved or  cleared by the [place] FDA and has been authorized for detection and/or diagnosis of SARS-CoV-2 by FDA under an Emergency Use Authorization (EUA).  This EUA will remain in effect (meaning this test can be used) for the duration of the COVID-19 declaration under Section 564(b)(1) of the Act, 21 U.S.C. section 360bbb-3(b)(1), unless the authorization is terminated or revoked sooner.  Performed at [facility], [address] [place], [place] [address]   Blood culture (routine single)     Status: None (Preliminary result)   Collection Time: 08/17/19 12:57 PM   Specimen: Right Antecubital; Blood  Result Value Ref Range Status   Specimen Description RIGHT ANTECUBITAL BOTTLES DRAWN AEROBIC ONLY  Final   Special Requests   Final    Blood Culture adequate volume Performed at [facility], [address], [place], [place] [address]    Culture PENDING  Incomplete   Report Status PENDING  Incomplete  MRSA PCR Screening     Status: None   Collection Time: 08/17/19  3:40 PM   Specimen: Nasal Mucosa; Nasopharyngeal  Result Value Ref Range Status   MRSA by PCR NEGATIVE NEGATIVE Final    Comment:        The GeneXpert MRSA Assay (FDA approved for NASAL specimens only), is one component of a comprehensive MRSA colonization surveillance program. It is not intended to diagnose  MRSA infection nor to guide or monitor treatment for MRSA infections. Performed at [facility], [address] [place], [place] [address]    Time coordinating discharge:   SIGNED:  Irwin Brakeman, MD  [facility] 08/18/2019, 2:03 PM How to contact the [facility] Attending or Consulting provider [place] or covering provider during after hours [place], for this patient?  1. Check the care team in [facility] and look for a) attending/consulting TR[facility] provider listed and b) the [facility] team listed 2. Log into [email or web address] and use [place]'s universal password to access. If you do not have the password, please contact the hospital operator. 3. Locate the [facility] provider you are looking for under [facility] and page to a number that you can be directly reached. 4. If you still have difficulty reaching the provider, please page the [facility] (Director on Call) for the Hospitalists listed on amion for assistance.

## 2019-08-18 NOTE — Consult Note (Signed)
Reason for Consult: To manage dialysis and dialysis related needs Referring Physician: Charle Clear is an 59 y.o. male with past medical history significant for hypertension, diabetes mellitus, COPD, nonischemic cardiomyopathy with AICD, peripheral arterial disease status post BKA on the right and mild on the left.  He also has ESRD-[facility] unit on TTS schedule.  He reports presented to dialysis yesterday feeling well.  He does not remember anything after that other than being cold.  Was brought to the hospital and found to have a temperature of 103.  Blood cultures drawn, given Vanco and Zosyn-feels a lot better today, asking about going home.  Denies cough, dysuria.  Does have some diarrhea but thinks it is due to the antibiotics.  Admits that he has left foot.  Denies pain at tunneled dialysis catheter site.  TDC has been about a year   Dialyzes at [place] unknown. HD [place] unknown, Dialyzer unknown, Heparin unknown. Access right sided TDC.  Past Medical History:  Diagnosis Date  . 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 Allergic rhinitis   . Anemia   . Arthritis   . Chronic systolic heart failure ([place])    a. ECHO (12/2012) EF 25-30%, HK entireanteroseptal myocardium //  b.  EF 25%, diffuse HK, grade 1 diastolic dysfunction, MAC, mild LAE, normal RVSF, trivial pericardial effusion  . COPD (chronic obstructive pulmonary disease) ([place])   . Diabetes mellitus type II   . Diabetic nephropathy ([place])   . Diabetic neuropathy ([place])   . ESRD on hemodialysis [facility])    started HD June 2017, goes to [facility] HD unit, Dr Hinda Lenis  . History of cardiac catheterization    a.Myoview 1/15:  There is significant left ventricular dysfunction. There may be slight scar at the apex. There is no significant ischemia. LV Ejection Fraction: 27%  //  b. RHC/LHC (1/15) with mean RA 6, PA 47/22 mean 33, mean PCWP 20, PVR 2.5 WU, CI 2.5; 80% dLAD stenosis,  70% diffuse large D.   . History of kidney stones   . Hyperlipidemia   . Hypertension   . Kidney stones   . NICM (nonischemic cardiomyopathy) ([place])    Primarily nonischemic. Echo (12/14) with EF 25-30%. Echo (3/15) with EF 25%, mild to moderately dilated LV, normal RV size and systolic function.   . Pneumonia   . Urethral stricture     Past Surgical History:  Procedure Laterality Date  . ABDOMINAL AORTOGRAM W/LOWER EXTREMITY N/A 03/30/2016   Procedure: Abdominal Aortogram w/Lower Extremity;  Surgeon: Angelia Mould, MD;  Location: [place] CV LAB;  Service: Cardiovascular;  Laterality: N/A;  . AMPUTATION Right 04/26/2016   Procedure: Right Below Knee Amputation;  Surgeon: Newt Minion, MD;  Location: [place];  Service: Orthopedics;  Laterality: Right;  . AV FISTULA PLACEMENT Right 09/08/2015   Procedure: INSERTION OF 4-41m x 45cm  ARTERIOVENOUS (AV) GORE-TEX GRAFT RIGHT UPPER  ARM;  Surgeon: CAngelia Mould MD;  [place]  Service: Vascular;  Laterality: Right;  . AV FISTULA PLACEMENT Left 01/14/2016   Procedure: CREATION OF LEFT UPPER ARM ARTERIOVENOUS FISTULA;  Surgeon: CAngelia Mould MD;  [place]  Service: Vascular;  Laterality: Left;  . BASCILIC VEIN TRANSPOSITION Right 08/22/2014   Procedure: RIGHT UPPER ARM BASCILIC VEIN TRANSPOSITION;  Surgeon: CAngelia Mould MD;  [place]  Service: Vascular;  Laterality: Right;  . BELOW KNEE LEG AMPUTATION Right 04/26/2016  .  CARDIAC CATHETERIZATION    . CARDIAC DEFIBRILLATOR PLACEMENT  06/27/2013   Sub Q       BY DR Caryl Comes  . CATARACT EXTRACTION W/PHACO Right 08/06/2018   Procedure: CATARACT EXTRACTION PHACO AND INTRAOCULAR LENS PLACEMENT (IOC);  Surgeon: Baruch Goldmann, MD;  Location: [facility];  Service: Ophthalmology;  Laterality: Right;  CDE: 4.06  . CATARACT EXTRACTION W/PHACO Left 08/20/2018   Procedure: CATARACT EXTRACTION PHACO AND INTRAOCULAR LENS PLACEMENT (IOC);  Surgeon: Baruch Goldmann,  MD;  Location: [facility];  Service: Ophthalmology;  Laterality: Left;  CDE: 6.76  . COLONOSCOPY WITH PROPOFOL N/A 07/22/2015   Procedure: COLONOSCOPY WITH PROPOFOL;  Surgeon: Doran Stabler, MD;  Location: [facility];  Service: Gastroenterology;  Laterality: N/A;  . FEMORAL-POPLITEAL BYPASS GRAFT Right 03/31/2016   Procedure: BYPASS GRAFT FEMORAL-POPLITEAL ARTERY USING RIGHT GREATER SAPHENOUS NONREVERSED VEIN;  Surgeon: Angelia Mould, MD;  Location: [place];  Service: Vascular;  Laterality: Right;  . HERNIA REPAIR    . I & D EXTREMITY Right 03/31/2016   Procedure: IRRIGATION AND DEBRIDEMENT FOOT;  Surgeon: Angelia Mould, MD;  Location: [place];  Service: Vascular;  Laterality: Right;  . IMPLANTABLE CARDIOVERTER DEFIBRILLATOR IMPLANT N/A 06/27/2013   Procedure: SUB Q ICD;  Surgeon: Deboraha Sprang, MD;  Location: [facility] CATH LAB;  Service: Cardiovascular;  Laterality: N/A;  . INTRAOPERATIVE ARTERIOGRAM Right 03/31/2016   Procedure: INTRA OPERATIVE ARTERIOGRAM;  Surgeon: Angelia Mould, MD;  Location: [place];  Service: Vascular;  Laterality: Right;  . IR GENERIC HISTORICAL Right 11/30/2015   IR THROMBECTOMY AV FISTULA W/THROMBOLYSIS/PTA INC/SHUNT/IMG RIGHT 11/30/2015 Aletta Edouard, MD [facility]  . IR GENERIC HISTORICAL  11/30/2015   IR US GUIDE VASC ACCESS RIGHT 11/30/2015 Aletta Edouard, MD [facility]  . IR GENERIC HISTORICAL Right 12/15/2015   IR THROMBECTOMY AV FISTULA W/THROMBOLYSIS/PTA/STENT INC/SHUNT/IMG RT 12/15/2015 Arne Cleveland, MD [facility]  . IR GENERIC HISTORICAL  12/15/2015   IR US GUIDE VASC ACCESS RIGHT 12/15/2015 Arne [place], MD [facility]  . IR GENERIC HISTORICAL  12/28/2015   IR FLUORO GUIDE CV LINE RIGHT 12/28/2015 Marybelle Killings, MD [facility]  . IR GENERIC HISTORICAL  12/28/2015   IR US GUIDE VASC ACCESS RIGHT 12/28/2015 Marybelle Killings, MD [facility]  . LEFT A ND RIGHT HEART CATH  01/30/2013   DR Sung Amabile  . LEFT AND RIGHT HEART CATHETERIZATION  WITH CORONARY ANGIOGRAM N/A 01/30/2013   Procedure: LEFT AND RIGHT HEART CATHETERIZATION WITH CORONARY ANGIOGRAM;  Surgeon: Jolaine Artist, MD;  Location: [facility] CATH LAB;  Service: Cardiovascular;  Laterality: N/A;  . PERIPHERAL VASCULAR CATHETERIZATION Right 01/26/2015   Procedure: A/V Fistulagram;  Surgeon: Angelia Mould, MD;  Location: [place] CV LAB;  Service: Cardiovascular;  Laterality: Right;  . reapea urethral surgery for recurrent obstruction  2011  . TOTAL KNEE ARTHROPLASTY Right 2007  . VEIN HARVEST Right 03/31/2016   Procedure: RIGHT GREATER SAPHENOUS VEIN HARVEST;  Surgeon: Angelia Mould, MD;  Location: [facility] OR;  Service: Vascular;  Laterality: Right;    Family History  Problem Relation Age of Onset  . Bladder Cancer Mother   . Alcohol abuse Father   . Melanoma Father   . Stroke Maternal Grandmother   . Heart Problems Maternal Grandmother        unknown  . Diabetes Maternal Grandmother   . Heart disease Maternal Grandfather   . Prostate cancer Maternal Grandfather     Social History:  reports that he quit smoking about 9  years ago. His smoking use included cigarettes. He has a 64.00 pack-year smoking history. He has never used smokeless tobacco. He reports that he does not drink alcohol and does not use drugs.  Allergies:  Allergies  Allergen Reactions  . Epoetin Alfa   . Ferumoxytol   . Morphine Sulfate Rash and Other (See Comments)    Itches all over, red spots    Medications: I have reviewed the patient's current medications.   Results for orders placed or performed during the hospital encounter of 08/17/19 (from the past 48 hour(s))  SARS Coronavirus 2 by RT PCR (hospital order, performed in [facility] hospital lab) Nasopharyngeal Nasopharyngeal Swab     Status: None   Collection Time: 08/17/19 12:52 PM   Specimen: Nasopharyngeal Swab  Result Value Ref Range   SARS Coronavirus 2 NEGATIVE NEGATIVE    Comment: (NOTE) SARS-CoV-2 target  nucleic acids are NOT DETECTED.  The SARS-CoV-2 RNA is generally detectable in upper and lower respiratory specimens during the acute phase of infection. The lowest concentration of SARS-CoV-2 viral copies this assay can detect is 250 copies / mL. A negative result does not preclude SARS-CoV-2 infection and should not be used as the sole basis for treatment or other patient management decisions.  A negative result may occur with improper specimen collection / handling, submission of specimen other than nasopharyngeal swab, presence of viral mutation(s) within the areas targeted by this assay, and inadequate number of viral copies (<250 copies / mL). A negative result must be combined with clinical observations, patient history, and epidemiological information.  Fact Sheet for Patients:   StrictlyIdeas.no  Fact Sheet for Healthcare Providers: BankingDealers.co.za  This test is not yet approved or  cleared by the [place] FDA and has been authorized for detection and/or diagnosis of SARS-CoV-2 by FDA under an Emergency Use Authorization (EUA).  This EUA will remain in effect (meaning this test can be used) for the duration of the COVID-19 declaration under Section 564(b)(1) of the Act, 21 U.S.C. section 360bbb-3(b)(1), unless the authorization is terminated or revoked sooner.  Performed at [facility], [address] [place], [place] [address]   Lactic acid, plasma     Status: None   Collection Time: 08/17/19 12:57 PM  Result Value Ref Range   Lactic Acid, Venous 1.0 0.5 - 1.9 mmol/L    Comment: Performed at [facility], [address] [place], [place] [address]  Comprehensive metabolic panel     Status: Abnormal   Collection Time: 08/17/19 12:57 PM  Result Value Ref Range   Sodium 133 (L) 135 - 145 mmol/L   Potassium 4.0 3.5 - 5.1 mmol/L   Chloride 96 (L) 98 - 111 mmol/L   CO2 25 22 - 32 mmol/L   Glucose, Bld 168 (H) 70 -  99 mg/dL    Comment: Glucose reference range applies only to samples taken after fasting for at least 8 hours.   BUN 29 (H) 6 - 20 mg/dL   Creatinine, Ser 6.46 (H) 0.61 - 1.24 mg/dL   Calcium 8.4 (L) 8.9 - 10.3 mg/dL   Total Protein 8.1 6.5 - 8.1 g/dL   Albumin 3.9 3.5 - 5.0 g/dL   AST 32 15 - 41 U/L   ALT 29 0 - 44 U/L   Alkaline Phosphatase 60 38 - 126 U/L   Total Bilirubin 0.7 0.3 - 1.2 mg/dL   GFR calc non Af Amer 9 (L) >60 mL/min   GFR calc Af Amer 10 (L) >  60 mL/min   Anion gap 12 5 - 15    Comment: Performed at [facility], [address] [place], [place] [address]  CBC WITH DIFFERENTIAL     Status: Abnormal   Collection Time: 08/17/19 12:57 PM  Result Value Ref Range   WBC 9.2 4.0 - 10.5 K/uL   RBC 4.46 4.22 - 5.81 MIL/uL   Hemoglobin 12.7 (L) 13.0 - 17.0 g/dL   HCT 40.0 39 - 52 %   MCV 89.7 80.0 - 100.0 fL   MCH 28.5 26.0 - 34.0 pg   MCHC 31.8 30.0 - 36.0 g/dL   RDW 15.0 11.5 - 15.5 %   Platelets 138 (L) 150 - 400 K/uL   nRBC 0.0 0.0 - 0.2 %   Neutrophils Relative % 90 %   Neutro Abs 8.3 (H) 1.7 - 7.7 K/uL   Lymphocytes Relative 3 %   Lymphs Abs 0.2 (L) 0.7 - 4.0 K/uL   Monocytes Relative 7 %   Monocytes Absolute 0.6 0 - 1 K/uL   Eosinophils Relative 0 %   Eosinophils Absolute 0.0 0 - 0 K/uL   Basophils Relative 0 %   Basophils Absolute 0.0 0 - 0 K/uL   Immature Granulocytes 0 %   Abs Immature Granulocytes 0.02 0.00 - 0.07 K/uL    Comment: Performed at [facility], [address] [place], [place] [address]  Protime-INR     Status: None   Collection Time: 08/17/19 12:57 PM  Result Value Ref Range   Prothrombin Time 14.0 11.4 - 15.2 seconds   INR 1.1 0.8 - 1.2    Comment: (NOTE) INR goal varies based on device and disease states. Performed at [facility], [address] [place], [place] [address]   APTT     Status: None   Collection Time: 08/17/19 12:57 PM  Result Value Ref Range   aPTT 32 24 - 36 seconds    Comment: Performed at [facility], [address] [place], [place] [address]  Blood culture (routine single)     Status: None (Preliminary result)   Collection Time: 08/17/19 12:57 PM   Specimen: Right Antecubital; Blood  Result Value Ref Range   Specimen Description RIGHT ANTECUBITAL BOTTLES DRAWN AEROBIC ONLY    Special Requests      Blood Culture adequate volume Performed at [facility], [address] [place], [place] [address]    Culture PENDING    Report Status PENDING   Procalcitonin - Baseline     Status: None   Collection Time: 08/17/19 12:57 PM  Result Value Ref Range   Procalcitonin 2.61 ng/mL    Comment:        Interpretation: PCT > 2 ng/mL: Systemic infection (sepsis) is likely, unless other causes are known. (NOTE)       Sepsis PCT Algorithm           Lower Respiratory Tract                                      Infection PCT Algorithm    ----------------------------     ----------------------------         PCT < 0.25 ng/mL                PCT < 0.10 ng/mL          Strongly encourage  Strongly discourage   discontinuation of antibiotics    initiation of antibiotics    ----------------------------     -----------------------------       PCT 0.25 - 0.50 ng/mL            PCT 0.10 - 0.25 ng/mL               OR       >80% decrease in PCT            Discourage initiation of                                            antibiotics      Encourage discontinuation           of antibiotics    ----------------------------     -----------------------------         PCT >= 0.50 ng/mL              PCT 0.26 - 0.50 ng/mL               AND       <80% decrease in PCT              Encourage initiation of                                             antibiotics       Encourage continuation           of antibiotics    ----------------------------     -----------------------------        PCT >= 0.50 ng/mL                  PCT > 0.50 ng/mL               AND         increase in PCT                  Strongly encourage                                       initiation of antibiotics    Strongly encourage escalation           of antibiotics                                     -----------------------------                                           PCT <= 0.25 ng/mL                                                 OR                                        >  80% decrease in PCT                                      Discontinue / Do not initiate                                             antibiotics  Performed at [facility], [address], [place], [place] [address]   Lactic acid, plasma     Status: None   Collection Time: 08/17/19  2:54 PM  Result Value Ref Range   Lactic Acid, Venous 0.7 0.5 - 1.9 mmol/L    Comment: Performed at [facility], [address] [place], [place] [address]  MRSA PCR Screening     Status: None   Collection Time: 08/17/19  3:40 PM   Specimen: Nasal Mucosa; Nasopharyngeal  Result Value Ref Range   MRSA by PCR NEGATIVE NEGATIVE    Comment:        The GeneXpert MRSA Assay (FDA approved for NASAL specimens only), is one component of a comprehensive MRSA colonization surveillance program. It is not intended to diagnose MRSA infection nor to guide or monitor treatment for MRSA infections. Performed at [facility], [address], [place], [place] [address]   Glucose, capillary     Status: Abnormal   Collection Time: 08/17/19  3:54 PM  Result Value Ref Range   Glucose-Capillary 153 (H) 70 - 99 mg/dL    Comment: Glucose reference range applies only to samples taken after fasting for at least 8 hours.  Glucose, capillary     Status: Abnormal   Collection Time: 08/17/19  9:32 PM  Result Value Ref Range   Glucose-Capillary 120 (H) 70 - 99 mg/dL    Comment: Glucose reference range applies only to samples taken after fasting for at least 8 hours.  Glucose, capillary     Status: Abnormal   Collection Time: 08/18/19  2:22 AM  Result Value Ref Range   Glucose-Capillary 118 (H) 70  - 99 mg/dL    Comment: Glucose reference range applies only to samples taken after fasting for at least 8 hours.  Magnesium     Status: None   Collection Time: 08/18/19  6:20 AM  Result Value Ref Range   Magnesium 1.7 1.7 - 2.4 mg/dL    Comment: Performed at [facility], [address], [place], [place] [address]  Phosphorus     Status: Abnormal   Collection Time: 08/18/19  6:20 AM  Result Value Ref Range   Phosphorus 5.4 (H) 2.5 - 4.6 mg/dL    Comment: Performed at [facility], [address], [place], [place] [address]  Comprehensive metabolic panel     Status: Abnormal   Collection Time: 08/18/19  6:20 AM  Result Value Ref Range   Sodium 133 (L) 135 - 145 mmol/L   Potassium 4.5 3.5 - 5.1 mmol/L   Chloride 97 (L) 98 - 111 mmol/L   CO2 22 22 - 32 mmol/L   Glucose, Bld 123 (H) 70 - 99 mg/dL    Comment: Glucose reference range applies only to samples taken after fasting for at least 8 hours.   BUN 47 (H) 6 - 20 mg/dL   Creatinine, Ser 9.25 (H) 0.61 - 1.24 mg/dL   Calcium 7.5 (L) 8.9 -  10.3 mg/dL   Total Protein 6.6 6.5 - 8.1 g/dL   Albumin 2.9 (L) 3.5 - 5.0 g/dL   AST 75 (H) 15 - 41 U/L   ALT 42 0 - 44 U/L   Alkaline Phosphatase 41 38 - 126 U/L   Total Bilirubin 1.2 0.3 - 1.2 mg/dL   GFR calc non Af Amer 6 (L) >60 mL/min   GFR calc Af Amer 7 (L) >60 mL/min   Anion gap 14 5 - 15    Comment: Performed at [facility], [address], [place], [place] [address]  CBC WITH DIFFERENTIAL     Status: Abnormal   Collection Time: 08/18/19  6:20 AM  Result Value Ref Range   WBC 7.3 4.0 - 10.5 K/uL   RBC 4.03 (L) 4.22 - 5.81 MIL/uL   Hemoglobin 11.4 (L) 13.0 - 17.0 g/dL   HCT 37.1 (L) 39 - 52 %   MCV 92.1 80.0 - 100.0 fL   MCH 28.3 26.0 - 34.0 pg   MCHC 30.7 30.0 - 36.0 g/dL   RDW 15.2 11.5 - 15.5 %   Platelets 120 (L) 150 - 400 K/uL   nRBC 0.0 0.0 - 0.2 %   Neutrophils Relative % 85 %   Neutro Abs 6.2 1.7 - 7.7 K/uL   Lymphocytes Relative 7 %   Lymphs Abs 0.5 (L) 0.7 - 4.0 K/uL    Monocytes Relative 7 %   Monocytes Absolute 0.5 0 - 1 K/uL   Eosinophils Relative 0 %   Eosinophils Absolute 0.0 0 - 0 K/uL   Basophils Relative 1 %   Basophils Absolute 0.0 0 - 0 K/uL   Immature Granulocytes 0 %   Abs Immature Granulocytes 0.03 0.00 - 0.07 K/uL    Comment: Performed at [facility], [address], [place], [place] [address]  Procalcitonin     Status: None   Collection Time: 08/18/19  6:20 AM  Result Value Ref Range   Procalcitonin 6.14 ng/mL    Comment:        Interpretation: PCT > 2 ng/mL: Systemic infection (sepsis) is likely, unless other causes are known. (NOTE)       Sepsis PCT Algorithm           Lower Respiratory Tract                                      Infection PCT Algorithm    ----------------------------     ----------------------------         PCT < 0.25 ng/mL                PCT < 0.10 ng/mL          Strongly encourage             Strongly discourage   discontinuation of antibiotics    initiation of antibiotics    ----------------------------     -----------------------------       PCT 0.25 - 0.50 ng/mL            PCT 0.10 - 0.25 ng/mL               OR       >80% decrease in PCT            Discourage initiation of  antibiotics      Encourage discontinuation           of antibiotics    ----------------------------     -----------------------------         PCT >= 0.50 ng/mL              PCT 0.26 - 0.50 ng/mL               AND       <80% decrease in PCT              Encourage initiation of                                             antibiotics       Encourage continuation           of antibiotics    ----------------------------     -----------------------------        PCT >= 0.50 ng/mL                  PCT > 0.50 ng/mL               AND         increase in PCT                  Strongly encourage                                      initiation of antibiotics    Strongly encourage escalation            of antibiotics                                     -----------------------------                                           PCT <= 0.25 ng/mL                                                 OR                                        > 80% decrease in PCT                                      Discontinue / Do not initiate                                             antibiotics  Performed at [facility], [address], [place], [place] [address]   Glucose, capillary     Status: Abnormal   Collection Time: 08/18/19  7:39 AM  Result Value Ref Range   Glucose-Capillary 123 (H) 70 - 99 mg/dL    Comment: Glucose reference range applies only to samples taken after fasting for at least 8 hours.  Glucose, capillary     Status: Abnormal   Collection Time: 08/18/19 11:32 AM  Result Value Ref Range   Glucose-Capillary 295 (H) 70 - 99 mg/dL    Comment: Glucose reference range applies only to samples taken after fasting for at least 8 hours.    DG Chest Port 1 View  Addendum Date: 08/18/2019   ADDENDUM REPORT: 08/18/2019 09:43 ADDENDUM: Corrected report: FINDINGS: Patient has RIGHT-sided dialysis catheter, tip overlying the UPPER RIGHT atrium. Stable appearance of subcutaneous defibrillator lead. Electronically Signed   By: Nolon Nations M.D.   On: 08/18/2019 09:43   Result Date: 08/18/2019 CLINICAL DATA:  Dialysis for possible sepsis. EXAM: PORTABLE CHEST 1 VIEW COMPARISON:  07/01/2019 FINDINGS: Patient has RIGHT-sided dialysis catheter, tip overlying the UPPER RIGHT atrium. Stable appearance of subcutaneous subdural later bleeding. Heart size is mildly enlarged and stable in configuration. Minimal subsegmental atelectasis at the LEFT lung base. There has been significant improvement in the appearance of lungs since prior study. IMPRESSION: 1. Stable cardiomegaly. 2. Improved aeration. Electronically Signed: By: Nolon Nations M.D. On: 08/17/2019 13:14   DG CHEST PORT 1 VIEW  Result Date:  08/18/2019 CLINICAL DATA:  Fever.  Dialysis patient. EXAM: PORTABLE CHEST 1 VIEW COMPARISON:  08/17/2019 FINDINGS: RIGHT-sided dialysis catheter tip overlies the superior vena cava. Subcutaneous defibrillator lead is stable in appearance accounting for differences in position. Heart is enlarged and stable in configuration. There is minimal subsegmental atelectasis at the LEFT lung base. No new consolidations. IMPRESSION: 1. Stable cardiomegaly. 2. No new consolidations. Electronically Signed   By: Nolon Nations M.D.   On: 08/18/2019 09:42    ROS: Is negative at present Blood pressure (!) 167/51, pulse 80, temperature 99.1 F (37.3 C), temperature source Axillary, resp. rate (!) 21, height _0  (1.778 m), weight 95.8 kg, SpO2 94 %. General appearance: alert, cooperative and no distress Resp: clear to auscultation bilaterally Cardio: regular rate and rhythm, S1, S2 normal, no murmur, click, rub or gallop GI: soft, non-tender; bowel sounds normal; no masses,  no organomegaly Extremities: extremities normal, atraumatic, no cyanosis or edema and Has a right BKA, left foot is improved, not examined Right sided TDC, nontender and no drainage hub is dirty however  Assessment/Plan: 59 year old white male with multiple medical problems including ESRD.  Presents with fever after dialysis 1 fever- no source found yet.  Is improved after antibiotics including vancomycin and Zosyn.  White blood count not that abnormal at 7.3 this morning.  Blood cultures are pending.  TDC is not obviously infected so we do not usually remove right away in this case- try to treat through it.  It is possible that he had a transient bacteremia is related to the dialysis treatment itself.  2 ESRD: Normally TTS via [place].  There are no indications for dialysis today.  Next treatment will be due on Tuesday 3 Hypertension: Controlled 4. Anemia of ESRD: Controlled 5. Metabolic Bone Disease: Calcium and phosphorus within  normal limits today 6. Dispo-could make an argument to send home and give Vanc and [place] with dialysis treatments until blood culture results back, and simplify or stop antibiotics based on cultures.  If would have another event such as this associated with dialysis , especially on abx would need TDC removal and  replacement  Anthony Bullock 08/18/2019, 12:21 PM

## 2019-08-18 NOTE — Care Management Obs Status (Signed)
[facility] NOTIFICATION   Patient Details  Name: Anthony Bullock MRN: [id number] Date of Birth: [birth year]   Medicare Observation Status Notification Given:  Yes    Natasha Bence, LCSW 08/18/2019, 1:56 PM

## 2019-08-18 NOTE — Discharge Instructions (Signed)
Please follow regular hemodialysis schedule.    Antibiotics will be given during dialysis treatments as arranged by nephrology.     IMPORTANT INFORMATION: PAY CLOSE ATTENTION   PHYSICIAN DISCHARGE INSTRUCTIONS  Follow with Primary care provider  [name], MD  and other consultants as instructed by your Hospitalist Physician  [place] IF SYMPTOMS COME BACK, WORSEN OR NEW PROBLEM DEVELOPS   Please note: You were cared for by a hospitalist during your hospital stay. Every effort will be made to forward records to your primary care provider.  You can request that your primary care provider send for your hospital records if they have not received them.  Once you are discharged, your primary care physician will handle any further medical issues. Please note that NO REFILLS for any discharge medications will be authorized once you are discharged, as it is imperative that you return to your primary care physician (or establish a relationship with a primary care physician if you do not have one) for your post hospital discharge needs so that they can reassess your need for medications and monitor your lab values.  Please get a complete blood count and chemistry panel checked by your Primary MD at your next visit, and again as instructed by your Primary MD.  Get Medicines reviewed and adjusted: Please take all your medications with you for your next visit with your Primary MD  Laboratory/radiological data: Please request your Primary MD to go over all hospital tests and procedure/radiological results at the follow up, please ask your primary care provider to get all Hospital records sent to his/her office.  In some cases, they will be blood work, cultures and biopsy results pending at the time of your discharge. Please request that your primary care provider follow up on these results.  If you are diabetic, please bring your blood sugar readings with you  to your follow up appointment with primary care.    Please call and make your follow up appointments as soon as possible.    Also Note the following: If you experience worsening of your admission symptoms, develop shortness of breath, life threatening emergency, suicidal or homicidal thoughts you must seek medical attention immediately by calling 911 or calling your MD immediately  if symptoms less severe.  You must read complete instructions/literature along with all the possible adverse reactions/side effects for all the Medicines you take and that have been prescribed to you. Take any new Medicines after you have completely understood and accpet all the possible adverse reactions/side effects.   Do not drive when taking Pain medications or sleeping medications (Benzodiazepines)  Do not take more than prescribed Pain, Sleep and Anxiety Medications. It is not advisable to combine anxiety,sleep and pain medications without talking with your primary care practitioner  Special Instructions: If you have smoked or chewed Tobacco  in the last 2 yrs please stop smoking, stop any regular Alcohol  and or any Recreational drug use.  Wear Seat belts while driving.  Do not drive if taking any narcotic, mind altering or controlled substances or recreational drugs or alcohol.

## 2019-08-20 ENCOUNTER — Ambulatory Visit (INDEPENDENT_AMBULATORY_CARE_PROVIDER_SITE_OTHER): Payer: Medicare Other | Admitting: Orthopedic Surgery

## 2019-08-20 ENCOUNTER — Other Ambulatory Visit: Payer: Self-pay | Admitting: Physician Assistant

## 2019-08-20 ENCOUNTER — Encounter (HOSPITAL_COMMUNITY): Payer: Self-pay | Admitting: Orthopedic Surgery

## 2019-08-20 ENCOUNTER — Other Ambulatory Visit: Payer: Self-pay

## 2019-08-20 ENCOUNTER — Encounter: Payer: Self-pay | Admitting: Family

## 2019-08-20 ENCOUNTER — Ambulatory Visit (INDEPENDENT_AMBULATORY_CARE_PROVIDER_SITE_OTHER): Payer: Medicare Other

## 2019-08-20 VITALS — Ht 70.0 in | Wt 211.0 lb

## 2019-08-20 DIAGNOSIS — N186 End stage renal disease: Secondary | ICD-10-CM | POA: Diagnosis not present

## 2019-08-20 DIAGNOSIS — L97521 Non-pressure chronic ulcer of other part of left foot limited to breakdown of skin: Secondary | ICD-10-CM

## 2019-08-20 DIAGNOSIS — D631 Anemia in chronic kidney disease: Secondary | ICD-10-CM | POA: Diagnosis not present

## 2019-08-20 DIAGNOSIS — N2581 Secondary hyperparathyroidism of renal origin: Secondary | ICD-10-CM | POA: Diagnosis not present

## 2019-08-20 DIAGNOSIS — R509 Fever, unspecified: Secondary | ICD-10-CM | POA: Diagnosis not present

## 2019-08-20 DIAGNOSIS — I2510 Atherosclerotic heart disease of native coronary artery without angina pectoris: Secondary | ICD-10-CM | POA: Diagnosis not present

## 2019-08-20 DIAGNOSIS — I96 Gangrene, not elsewhere classified: Secondary | ICD-10-CM | POA: Diagnosis not present

## 2019-08-20 DIAGNOSIS — Z992 Dependence on renal dialysis: Secondary | ICD-10-CM

## 2019-08-20 DIAGNOSIS — D509 Iron deficiency anemia, unspecified: Secondary | ICD-10-CM | POA: Diagnosis not present

## 2019-08-20 NOTE — Progress Notes (Signed)
Pt denies SOB and chest pain. Pt stated that he is under the care of Dr. Harl Bowie, Cardiology and Dr. Elease Hashimoto, PCP. Pt made aware to stop taking Svitamins, fish oil and herbal medications. Do not take any NSAIDs ie: Ibuprofen, Advil, Naproxen (Aleve), Motrin, BC and Goody Powder. Pt made aware to take 35 units of Basaglar insulin at HS. Pt made aware to check CBG every 2 hours prior to arrival to hospital on DOS. Pt made aware to treat a CBG < 70 with 4 ounces of apple or cranberry juice, wait 15 minutes after intervention to recheck BG, if BG remains < 70, call Short Stay unit to speak with a nurse. Pt made aware to take 1/2 correction dose for CBG > 220. Pt reminded to quarantine. Pt verbalized understanding of all pre-op instructions.

## 2019-08-20 NOTE — Progress Notes (Signed)
Office Visit Note   Patient: Anthony Bullock           Date of Birth: [birth year]           MRN: [id number] Visit Date: 08/20/2019              Requested by: [name], MD [place],  [place] [address] PCP: [name], MD  Chief Complaint  Patient presents with  . Left Foot - Wound Check    Follow up ER visit 08/17/19      HPI: Patient is a 59 year old gentleman with diabetic insensate neuropathy peripheral vascular disease end-stage renal disease on dialysis, with a right transtibial amputation who presents complaining of a necrotic wound over the left  fifth metatarsal head.  Patient states that he was in the hospital Saturday after dialysis with a temperature of 103 he was given vancomycin and ceftazidime with his dialysis he states that the fever was classified as a fever of unknown origin.  Patient states that the ulcer over the fifth metatarsal head has been getting worse.  Patient complains of maceration drainage redness with a temperature of 101.8 Sunday.  Patient is currently receiving antibiotics with dialysis.  Assessment & Plan: Visit Diagnoses:  1. Foot ulcer, left, limited to breakdown of skin ([place])   2. ESRD (end stage renal disease) on dialysis ([place])   3. Gangrene of left foot ([place])     Plan: Patient has a necrotic ulcer over the fifth metatarsal head will need to plan for a left foot fifth ray amputation.  Discussed that with the calcified microcirculation patient may have difficulty healing.  We will plan for outpatient surgery with discharge with a wound VAC he will need to continue with his antibiotics with dialysis.  Follow-Up Instructions: Return in about 1 week (around 08/27/2019).   Ortho Exam  Patient is alert, oriented, no adenopathy, well-dressed, normal affect, normal respiratory effort. Examination patient does not have a palpable pulse at the ankle Doppler was used and he has a dampened monophasic dorsalis pedis and  posterior tibial pulse there is cellulitis extending from the fifth metatarsal head dorsally proximal to the ankle.  There is a necrotic wound surrounding the fifth metatarsal head with no exposed bone or tendon the ulcer is 4 cm in diameter.  Hemoglobin A1c most recently 8.1.  Imaging: XR Foot Complete Left  Result Date: 08/20/2019 Three-view radiographs of the left foot shows extensive calcification of the arteries out to the toes with air in the soft tissue around the fifth metatarsal head there are no destructive bony changes to the fifth metatarsal head.  No images are attached to the encounter.  Labs: Lab Results  Component Value Date   HGBA1C 8.1 (H) 07/10/2019   HGBA1C 8.9 (H) 08/06/2018   HGBA1C 11.6 02/15/2018   ESRSEDRATE 94 (H) 03/30/2016   ESRSEDRATE 18 11/30/2012   CRP 16.1 (H) 03/30/2016   REPTSTATUS PENDING 08/17/2019   GRAMSTAIN  03/31/2016    RARE WBC PRESENT, PREDOMINANTLY PMN NO ORGANISMS SEEN    CULT  08/17/2019    NO GROWTH 3 DAYS Performed at [facility], [address], [place], [place] [address]    [place] 03/31/2016   LABORGA ENTEROBACTER SPECIES 03/31/2016     Lab Results  Component Value Date   ALBUMIN 2.9 (L) 08/18/2019   ALBUMIN 3.9 08/17/2019   ALBUMIN 3.5 07/01/2019    Lab Results  Component Value Date   MG 1.7  08/18/2019   MG 2.1 12/03/2014   MG 2.1 11/04/2014   No results found for: VD25OH  No results found for: PREALBUMIN CBC EXTENDED Latest Ref Rng & Units 08/18/2019 08/17/2019 07/11/2019  WBC 4.0 - 10.5 K/uL 7.3 9.2 12.5(H)  RBC 4.22 - 5.81 MIL/uL 4.03(L) 4.46 3.56(L)  HGB 13.0 - 17.0 g/dL 11.4(L) 12.7(L) 10.4(L)  HCT 39 - 52 % 37.1(L) 40.0 32.1(L)  PLT 150 - 400 K/uL 120(L) 138(L) 136(L)  NEUTROABS 1.7 - 7.7 K/uL 6.2 8.3(H) 10.1(H)  LYMPHSABS 0.7 - 4.0 K/uL 0.5(L) 0.2(L) 1.0     Body mass index is 30.28 kg/m.  Orders:  Orders Placed This Encounter  Procedures  . XR Foot Complete Left   No  orders of the defined types were placed in this encounter.    Procedures: No procedures performed  Clinical Data: No additional findings.  ROS:  All other systems negative, except as noted in the HPI. Review of Systems  Objective: Vital Signs: Ht _0  (1.778 m)   Wt 211 lb (95.7 kg)   BMI 30.28 kg/m   Specialty Comments:  No specialty comments available.  PMFS History: Patient Active Problem List   Diagnosis Date Noted  . Febrile illness 08/17/2019  . SIRS (systemic inflammatory response syndrome) ([place]) 08/17/2019  . Thrombocytopenia ([place]) 08/17/2019  . Pressure injury of skin 08/17/2019  . Hypertensive crisis 07/10/2019  . Onychomycosis 08/16/2016  . Status post unilateral below knee amputation, right ([place]) 04/26/2016  . Bilateral carotid bruits 03/30/2016  . Diabetes mellitus with complication ([place])   . Anemia due to chronic kidney disease   . ESRD (end stage renal disease) on dialysis ([place]) 11/30/2015  . Acute respiratory failure with hypoxia ([place]) 11/29/2015  . At high risk for falls 09/29/2014  . Peripheral vascular disease ([place]) 09/29/2014  . Osteoarthritis of both knees 01/07/2014  . Osteoarthritis of multiple joints 10/06/2013  . NICM (nonischemic cardiomyopathy) ([place]) 06/27/2013  . CAD (coronary artery disease) 03/13/2013  . Abnormal nuclear stress test 01/27/2013  . Chronic systolic congestive heart failure ([place]) 01/27/2013  . COPD (chronic obstructive pulmonary disease) ([place]) 12/27/2012  . Diabetic peripheral neuropathy ([place]) 02/10/2012  . URINARY CALCULUS 09/28/2009  . Adjustment disorder with depressed mood 08/21/2009  . Poorly controlled type II diabetes mellitus with renal complication ([place]) 64/33/2951  . Hyperlipidemia 04/14/2008  . Essential hypertension 04/14/2008  . ALLERGIC RHINITIS 04/14/2008   Past Medical History:  Diagnosis Date  . 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 Allergic rhinitis   .  Anemia   . Arthritis   . Chronic systolic heart failure ([place])    a. ECHO (12/2012) EF 25-30%, HK entireanteroseptal myocardium //  b.  EF 25%, diffuse HK, grade 1 diastolic dysfunction, MAC, mild LAE, normal RVSF, trivial pericardial effusion  . COPD (chronic obstructive pulmonary disease) ([place])   . Diabetes mellitus type II   . Diabetic nephropathy ([place])   . Diabetic neuropathy ([place])   . ESRD on hemodialysis [facility])    started HD June 2017, goes to [facility] HD unit, Dr Hinda Lenis  . History of cardiac catheterization    a.Myoview 1/15:  There is significant left ventricular dysfunction. There may be slight scar at the apex. There is no significant ischemia. LV Ejection Fraction: 27%  //  b. RHC/LHC (1/15) with mean RA 6, PA 47/22 mean 33, mean PCWP 20, PVR 2.5 WU, CI 2.5; 80% dLAD stenosis, 70% diffuse large D.   . History of  kidney stones   . Hyperlipidemia   . Hypertension   . Kidney stones   . NICM (nonischemic cardiomyopathy) ([place])    Primarily nonischemic. Echo (12/14) with EF 25-30%. Echo (3/15) with EF 25%, mild to moderately dilated LV, normal RV size and systolic function.   . Pneumonia   . Urethral stricture     Family History  Problem Relation Age of Onset  . Bladder Cancer Mother   . Alcohol abuse Father   . Melanoma Father   . Stroke Maternal Grandmother   . Heart Problems Maternal Grandmother        unknown  . Diabetes Maternal Grandmother   . Heart disease Maternal Grandfather   . Prostate cancer Maternal Grandfather     Past Surgical History:  Procedure Laterality Date  . ABDOMINAL AORTOGRAM W/LOWER EXTREMITY N/A 03/30/2016   Procedure: Abdominal Aortogram w/Lower Extremity;  Surgeon: Angelia Mould, MD;  Location: [place] [facility];  Service: Cardiovascular;  Laterality: N/A;  . AMPUTATION Right 04/26/2016   Procedure: Right Below Knee Amputation;  Surgeon: Newt Minion, MD;  Location: [place];  Service: Orthopedics;  Laterality: Right;  . AV FISTULA  PLACEMENT Right 09/08/2015   Procedure: INSERTION OF 4-74m x 45cm  ARTERIOVENOUS (AV) GORE-TEX GRAFT RIGHT UPPER  ARM;  Surgeon: CAngelia Mould MD;  Location: [facility]  Service: Vascular;  Laterality: Right;  . AV FISTULA PLACEMENT Left 01/14/2016   Procedure: CREATION OF LEFT UPPER ARM ARTERIOVENOUS FISTULA;  Surgeon: CAngelia Mould MD;  [facility] [facility]  Service: Vascular;  Laterality: Left;  . BASCILIC VEIN TRANSPOSITION Right 08/22/2014   Procedure: RIGHT UPPER ARM BASCILIC VEIN TRANSPOSITION;  Surgeon: CAngelia Mould MD;  Location[place] [facility]  Service: Vascular;  Laterality: Right;  . BELOW KNEE LEG AMPUTATION Right 04/26/2016  . CARDIAC CATHETERIZATION    . CARDIAC DEFIBRILLATOR PLACEMENT  06/27/2013   Sub Q       BY DR KCaryl Comes . CATARACT EXTRACTION W/PHACO Right 08/06/2018   Procedure: CATARACT EXTRACTION PHACO AND INTRAOCULAR LENS PLACEMENT (IOC);  Surgeon: WBaruch Goldmann MD;  Location: [facility];  Service: Ophthalmology;  Laterality: Right;  CDE: 4.06  . CATARACT EXTRACTION W/PHACO Left 08/20/2018   Procedure: CATARACT EXTRACTION PHACO AND INTRAOCULAR LENS PLACEMENT (IOC);  Surgeon: WBaruch Goldmann MD;  Location: [facility];  Service: Ophthalmology;  Laterality: Left;  CDE: 6.76  . COLONOSCOPY WITH PROPOFOL N/A 07/22/2015   Procedure: COLONOSCOPY WITH PROPOFOL;  Surgeon: HDoran Stabler MD;  Location: [facility];  Service: Gastroenterology;  Laterality: N/A;  . FEMORAL-POPLITEAL BYPASS GRAFT Right 03/31/2016   Procedure: BYPASS GRAFT FEMORAL-POPLITEAL ARTERY USING RIGHT GREATER SAPHENOUS NONREVERSED VEIN;  Surgeon: CAngelia Mould MD;  [place] [facility]  Service: Vascular;  Laterality: Right;  . HERNIA REPAIR    . I & D EXTREMITY Right 03/31/2016   Procedure: IRRIGATION AND DEBRIDEMENT FOOT;  Surgeon: CAngelia Mould MD;  Location: [facility]  Service: Vascular;  Laterality: Right;  . IMPLANTABLE CARDIOVERTER DEFIBRILLATOR IMPLANT N/A 06/27/2013   Procedure: SUB Q ICD;   Surgeon: SDeboraha Sprang MD;  [facility];  Service: Cardiovascular;  Laterality: N/A;  . INTRAOPERATIVE ARTERIOGRAM Right 03/31/2016   Procedure: INTRA OPERATIVE ARTERIOGRAM;  Surgeon: CAngelia Mould MD;  Location: [facility]  Service: Vascular;  Laterality: Right;  . IR GENERIC HISTORICAL Right 11/30/2015   IR THROMBECTOMY AV FISTULA W/THROMBOLYSIS/PTA INC/SHUNT/IMG RIGHT 11/30/2015 GAletta Edouard MD [facility]  . IR GENERIC HISTORICAL  11/30/2015   IR US GUIDE VASC ACCESS RIGHT 11/30/2015 Aletta Edouard, MD [facility]  . IR GENERIC HISTORICAL Right 12/15/2015   IR THROMBECTOMY AV FISTULA W/THROMBOLYSIS/PTA/STENT INC/SHUNT/IMG RT 12/15/2015 Arne Cleveland, MD [facility]  . IR GENERIC HISTORICAL  12/15/2015   IR US GUIDE VASC ACCESS RIGHT 12/15/2015 Arne Cleveland, MD [facility]  . IR GENERIC HISTORICAL  12/28/2015   IR FLUORO GUIDE CV LINE RIGHT 12/28/2015 Marybelle Killings, MD [facility]  . IR GENERIC HISTORICAL  12/28/2015   IR US GUIDE VASC ACCESS RIGHT 12/28/2015 Marybelle Killings, MD [facility]  . LEFT A ND RIGHT HEART CATH  01/30/2013   DR Sung Amabile  . LEFT AND RIGHT HEART CATHETERIZATION WITH CORONARY ANGIOGRAM N/A 01/30/2013   Procedure: LEFT AND RIGHT HEART CATHETERIZATION WITH CORONARY ANGIOGRAM;  Surgeon: Jolaine Artist, MD;  Location: [facility] C[facility] LAB;  Service: Cardiovascular;  Laterality: N/A;  . PERIPHERAL VASCULAR CATHETERIZATION Right 01/26/2015   Procedure: A/V Fistulagram;  Surgeon: Angelia Mould, MD;  Location: [place] CV [facility];  Service: Cardiovascular;  Laterality: Right;  . reapea urethral surgery for recurrent obstruction  2011  . TOTAL KNEE ARTHROPLASTY Right 2007  . VEIN HARVEST Right 03/31/2016   Procedure: RIGHT GREATER SAPHENOUS VEIN HARVEST;  Surgeon: Angelia Mould, MD;  Location: [place];  Service: Vascular;  Laterality: Right;   Social History   Occupational History  . Not on file  Tobacco Use  . Smoking status: Former Smoker     Packs/day: 2.00    Years: 32.00    Pack years: 64.00    Types: Cigarettes    Quit date: 05/11/2010    Years since quitting: 9.2  . Smokeless tobacco: Never Used  Vaping Use  . Vaping Use: Never used  Substance and Sexual Activity  . Alcohol use: No  . Drug use: No  . Sexual activity: Yes

## 2019-08-21 ENCOUNTER — Ambulatory Visit (HOSPITAL_COMMUNITY): Payer: Medicare Other | Admitting: Physician Assistant

## 2019-08-21 ENCOUNTER — Encounter: Payer: Self-pay | Admitting: Internal Medicine

## 2019-08-21 ENCOUNTER — Encounter (HOSPITAL_COMMUNITY): Payer: Self-pay | Admitting: Orthopedic Surgery

## 2019-08-21 ENCOUNTER — Other Ambulatory Visit (HOSPITAL_COMMUNITY): Payer: Medicare Other

## 2019-08-21 ENCOUNTER — Encounter (HOSPITAL_COMMUNITY)
Admission: RE | Disposition: A | Discharge status: Home / Self Care | Payer: Self-pay | Source: Ambulatory Visit | Attending: Orthopedic Surgery

## 2019-08-21 ENCOUNTER — Ambulatory Visit (HOSPITAL_COMMUNITY)
Admission: RE | Admit: 2019-08-21 | Discharge: 2019-08-21 | Disposition: A | Discharge status: Home / Self Care | Payer: Medicare Other | Source: Ambulatory Visit | Attending: Orthopedic Surgery | Admitting: Orthopedic Surgery

## 2019-08-21 DIAGNOSIS — L97521 Non-pressure chronic ulcer of other part of left foot limited to breakdown of skin: Secondary | ICD-10-CM | POA: Diagnosis not present

## 2019-08-21 DIAGNOSIS — Z95810 Presence of automatic (implantable) cardiac defibrillator: Secondary | ICD-10-CM | POA: Insufficient documentation

## 2019-08-21 DIAGNOSIS — E785 Hyperlipidemia, unspecified: Secondary | ICD-10-CM | POA: Diagnosis not present

## 2019-08-21 DIAGNOSIS — N186 End stage renal disease: Secondary | ICD-10-CM | POA: Diagnosis not present

## 2019-08-21 DIAGNOSIS — E1142 Type 2 diabetes mellitus with diabetic polyneuropathy: Secondary | ICD-10-CM | POA: Diagnosis not present

## 2019-08-21 DIAGNOSIS — Z8249 Family history of ischemic heart disease and other diseases of the circulatory system: Secondary | ICD-10-CM | POA: Diagnosis not present

## 2019-08-21 DIAGNOSIS — E1122 Type 2 diabetes mellitus with diabetic chronic kidney disease: Secondary | ICD-10-CM | POA: Diagnosis not present

## 2019-08-21 DIAGNOSIS — M869 Osteomyelitis, unspecified: Secondary | ICD-10-CM | POA: Insufficient documentation

## 2019-08-21 DIAGNOSIS — Z87891 Personal history of nicotine dependence: Secondary | ICD-10-CM | POA: Insufficient documentation

## 2019-08-21 DIAGNOSIS — I132 Hypertensive heart and chronic kidney disease with heart failure and with stage 5 chronic kidney disease, or end stage renal disease: Secondary | ICD-10-CM | POA: Insufficient documentation

## 2019-08-21 DIAGNOSIS — E1169 Type 2 diabetes mellitus with other specified complication: Secondary | ICD-10-CM | POA: Diagnosis not present

## 2019-08-21 DIAGNOSIS — E1152 Type 2 diabetes mellitus with diabetic peripheral angiopathy with gangrene: Secondary | ICD-10-CM | POA: Insufficient documentation

## 2019-08-21 DIAGNOSIS — I5022 Chronic systolic (congestive) heart failure: Secondary | ICD-10-CM | POA: Insufficient documentation

## 2019-08-21 DIAGNOSIS — E11621 Type 2 diabetes mellitus with foot ulcer: Secondary | ICD-10-CM | POA: Insufficient documentation

## 2019-08-21 DIAGNOSIS — Z992 Dependence on renal dialysis: Secondary | ICD-10-CM | POA: Diagnosis not present

## 2019-08-21 DIAGNOSIS — Z89511 Acquired absence of right leg below knee: Secondary | ICD-10-CM | POA: Insufficient documentation

## 2019-08-21 DIAGNOSIS — Z833 Family history of diabetes mellitus: Secondary | ICD-10-CM | POA: Diagnosis not present

## 2019-08-21 DIAGNOSIS — Z885 Allergy status to narcotic agent status: Secondary | ICD-10-CM | POA: Diagnosis not present

## 2019-08-21 DIAGNOSIS — I428 Other cardiomyopathies: Secondary | ICD-10-CM | POA: Diagnosis not present

## 2019-08-21 DIAGNOSIS — Z95828 Presence of other vascular implants and grafts: Secondary | ICD-10-CM | POA: Insufficient documentation

## 2019-08-21 DIAGNOSIS — J449 Chronic obstructive pulmonary disease, unspecified: Secondary | ICD-10-CM | POA: Insufficient documentation

## 2019-08-21 DIAGNOSIS — Z888 Allergy status to other drugs, medicaments and biological substances status: Secondary | ICD-10-CM | POA: Insufficient documentation

## 2019-08-21 HISTORY — DX: Osteomyelitis, unspecified: M86.9

## 2019-08-21 HISTORY — DX: Presence of spectacles and contact lenses: Z97.3

## 2019-08-21 HISTORY — PX: AMPUTATION: SHX166

## 2019-08-21 LAB — POCT I-STAT, CHEM 8
BUN: 45 mg/dL — ABNORMAL HIGH (ref 6–20)
Calcium, Ion: 1 mmol/L — ABNORMAL LOW (ref 1.15–1.40)
Chloride: 97 mmol/L — ABNORMAL LOW (ref 98–111)
Creatinine, Ser: 11.8 mg/dL — ABNORMAL HIGH (ref 0.61–1.24)
Glucose, Bld: 118 mg/dL — ABNORMAL HIGH (ref 70–99)
HCT: 33 % — ABNORMAL LOW (ref 39.0–52.0)
Hemoglobin: 11.2 g/dL — ABNORMAL LOW (ref 13.0–17.0)
Potassium: 4.2 mmol/L (ref 3.5–5.1)
Sodium: 136 mmol/L (ref 135–145)
TCO2: 24 mmol/L (ref 22–32)

## 2019-08-21 LAB — GLUCOSE, CAPILLARY: Glucose-Capillary: 110 mg/dL — ABNORMAL HIGH (ref 70–99)

## 2019-08-21 SURGERY — AMPUTATION, FOOT, RAY
Anesthesia: General | Laterality: Left

## 2019-08-21 MED ORDER — MIDAZOLAM HCL 2 MG/2ML IJ SOLN
INTRAMUSCULAR | Status: AC
  Filled 2019-08-21: qty 2

## 2019-08-21 MED ORDER — OXYCODONE HCL 5 MG/5ML PO SOLN: 5.0000 mg | Freq: Once | ORAL | Status: DC | PRN

## 2019-08-21 MED ORDER — DEXAMETHASONE SODIUM PHOSPHATE 10 MG/ML IJ SOLN
INTRAMUSCULAR | Status: DC | PRN
  Administered 2019-08-21: 4 mg via INTRAVENOUS

## 2019-08-21 MED ORDER — FENTANYL CITRATE (PF) 250 MCG/5ML IJ SOLN
INTRAMUSCULAR | Status: AC
  Filled 2019-08-21: qty 5

## 2019-08-21 MED ORDER — ACETAMINOPHEN 325 MG PO TABS: 325.0000 mg | ORAL_TABLET | Freq: Once | ORAL | Status: DC | PRN

## 2019-08-21 MED ORDER — OXYCODONE-ACETAMINOPHEN 5-325 MG PO TABS: 1.0000 | ORAL_TABLET | ORAL | 0 refills | Status: DC | PRN

## 2019-08-21 MED ORDER — 0.9 % SODIUM CHLORIDE (POUR BTL) OPTIME
TOPICAL | Status: DC | PRN
  Administered 2019-08-21: 1000 mL

## 2019-08-21 MED ORDER — FENTANYL CITRATE (PF) 250 MCG/5ML IJ SOLN
INTRAMUSCULAR | Status: DC | PRN
  Administered 2019-08-21: 50 ug via INTRAVENOUS

## 2019-08-21 MED ORDER — ORAL CARE MOUTH RINSE: 15.0000 mL | Freq: Once | OROMUCOSAL | Status: AC

## 2019-08-21 MED ORDER — HYDROMORPHONE HCL 1 MG/ML IJ SOLN: 0.2500 mg | INTRAMUSCULAR | Status: DC | PRN

## 2019-08-21 MED ORDER — SODIUM CHLORIDE 0.9 % IV SOLN: INTRAVENOUS | Status: DC

## 2019-08-21 MED ORDER — LIDOCAINE 2% (20 MG/ML) 5 ML SYRINGE
INTRAMUSCULAR | Status: AC
  Filled 2019-08-21: qty 5

## 2019-08-21 MED ORDER — ACETAMINOPHEN 160 MG/5ML PO SOLN: 325.0000 mg | Freq: Once | ORAL | Status: DC | PRN

## 2019-08-21 MED ORDER — DEXAMETHASONE SODIUM PHOSPHATE 10 MG/ML IJ SOLN
INTRAMUSCULAR | Status: AC
  Filled 2019-08-21: qty 1

## 2019-08-21 MED ORDER — ONDANSETRON HCL 4 MG/2ML IJ SOLN
INTRAMUSCULAR | Status: AC
  Filled 2019-08-21: qty 2

## 2019-08-21 MED ORDER — CHLORHEXIDINE GLUCONATE 0.12 % MT SOLN
15.0000 mL | Freq: Once | OROMUCOSAL | Status: AC
  Administered 2019-08-21: 15 mL via OROMUCOSAL
  Filled 2019-08-21: qty 15

## 2019-08-21 MED ORDER — MEPERIDINE HCL 25 MG/ML IJ SOLN: 6.2500 mg | INTRAMUSCULAR | Status: DC | PRN

## 2019-08-21 MED ORDER — FENTANYL CITRATE (PF) 100 MCG/2ML IJ SOLN
INTRAMUSCULAR | Status: AC
  Filled 2019-08-21: qty 2

## 2019-08-21 MED ORDER — PROPOFOL 10 MG/ML IV BOLUS
INTRAVENOUS | Status: DC | PRN
  Administered 2019-08-21: 140 mg via INTRAVENOUS

## 2019-08-21 MED ORDER — MIDAZOLAM HCL 2 MG/2ML IJ SOLN
INTRAMUSCULAR | Status: DC | PRN
  Administered 2019-08-21 (×2): 1 mg via INTRAVENOUS

## 2019-08-21 MED ORDER — LACTATED RINGERS IV SOLN: INTRAVENOUS | Status: DC

## 2019-08-21 MED ORDER — OXYCODONE HCL 5 MG PO TABS: 5.0000 mg | ORAL_TABLET | Freq: Once | ORAL | Status: DC | PRN

## 2019-08-21 MED ORDER — AMISULPRIDE (ANTIEMETIC) 5 MG/2ML IV SOLN: 10.0000 mg | Freq: Once | INTRAVENOUS | Status: DC | PRN

## 2019-08-21 MED ORDER — ACETAMINOPHEN 10 MG/ML IV SOLN: 1000.0000 mg | Freq: Once | INTRAVENOUS | Status: DC | PRN

## 2019-08-21 MED ORDER — ONDANSETRON HCL 4 MG/2ML IJ SOLN
INTRAMUSCULAR | Status: DC | PRN
  Administered 2019-08-21: 4 mg via INTRAVENOUS

## 2019-08-21 MED ORDER — CEFAZOLIN SODIUM-DEXTROSE 2-4 GM/100ML-% IV SOLN
2.0000 g | INTRAVENOUS | Status: AC
  Administered 2019-08-21: 2 g via INTRAVENOUS

## 2019-08-21 MED ORDER — CEFAZOLIN SODIUM-DEXTROSE 2-4 GM/100ML-% IV SOLN
INTRAVENOUS | Status: AC
  Filled 2019-08-21: qty 100

## 2019-08-21 MED ORDER — LIDOCAINE 2% (20 MG/ML) 5 ML SYRINGE
INTRAMUSCULAR | Status: DC | PRN
  Administered 2019-08-21: 40 mg via INTRAVENOUS

## 2019-08-21 MED ORDER — PROPOFOL 1000 MG/100ML IV EMUL
INTRAVENOUS | Status: AC
  Filled 2019-08-21: qty 100

## 2019-08-21 SURGICAL SUPPLY — 30 items
BLADE SAW SGTL MED 73X18.5 STR (BLADE) IMPLANT
BLADE SURG 21 STRL SS (BLADE) ×3 IMPLANT
BNDG COHESIVE 4X5 TAN STRL (GAUZE/BANDAGES/DRESSINGS) ×3 IMPLANT
BNDG GAUZE ELAST 4 BULKY (GAUZE/BANDAGES/DRESSINGS) ×3 IMPLANT
COVER SURGICAL LIGHT HANDLE (MISCELLANEOUS) ×6 IMPLANT
COVER WAND RF STERILE (DRAPES) ×3 IMPLANT
DRAPE U-SHAPE 47X51 STRL (DRAPES) ×6 IMPLANT
DRSG ADAPTIC 3X8 NADH LF (GAUZE/BANDAGES/DRESSINGS) ×3 IMPLANT
DRSG EMULSION OIL 3X3 NADH (GAUZE/BANDAGES/DRESSINGS) ×2 IMPLANT
DRSG PAD ABDOMINAL 8X10 ST (GAUZE/BANDAGES/DRESSINGS) ×4 IMPLANT
DURAPREP 26ML APPLICATOR (WOUND CARE) ×3 IMPLANT
ELECT REM PT RETURN 9FT ADLT (ELECTROSURGICAL) ×3
ELECTRODE REM PT RTRN 9FT ADLT (ELECTROSURGICAL) ×1 IMPLANT
GAUZE SPONGE 4X4 12PLY STRL (GAUZE/BANDAGES/DRESSINGS) ×3 IMPLANT
GLOVE BIOGEL PI IND STRL 9 (GLOVE) ×1 IMPLANT
GLOVE BIOGEL PI INDICATOR 9 (GLOVE) ×2
GLOVE SURG ORTHO 9.0 STRL STRW (GLOVE) ×3 IMPLANT
GOWN STRL REUS W/ TWL XL LVL3 (GOWN DISPOSABLE) ×2 IMPLANT
GOWN STRL REUS W/TWL XL LVL3 (GOWN DISPOSABLE) ×6
KIT BASIN OR (CUSTOM PROCEDURE TRAY) ×3 IMPLANT
KIT TURNOVER KIT B (KITS) ×3 IMPLANT
NS IRRIG 1000ML POUR BTL (IV SOLUTION) ×3 IMPLANT
PACK ORTHO EXTREMITY (CUSTOM PROCEDURE TRAY) ×3 IMPLANT
PAD ARMBOARD 7.5X6 YLW CONV (MISCELLANEOUS) ×6 IMPLANT
STOCKINETTE IMPERVIOUS LG (DRAPES) ×2 IMPLANT
SUT ETHILON 2 0 PSLX (SUTURE) ×3 IMPLANT
TOWEL GREEN STERILE (TOWEL DISPOSABLE) ×3 IMPLANT
TUBE CONNECTING 12'X1/4 (SUCTIONS) ×1
TUBE CONNECTING 12X1/4 (SUCTIONS) ×2 IMPLANT
YANKAUER SUCT BULB TIP NO VENT (SUCTIONS) ×3 IMPLANT

## 2019-08-21 NOTE — H&P (Signed)
Anthony Bullock is an 59 y.o. male.   Chief Complaint: Left Foot 5th Ray Osteomyelitis HPI: Patient is a 59 year old gentleman with diabetic insensate neuropathy peripheral vascular disease end-stage renal disease on dialysis, with a right transtibial amputation who presents complaining of a necrotic wound over the left  fifth metatarsal head.  Patient states that he was in the hospital Saturday after dialysis with a temperature of 103 he was given vancomycin and ceftazidime with his dialysis he states that the fever was classified as a fever of unknown origin.  Patient states that the ulcer over the fifth metatarsal head has been getting worse.  Patient complains of maceration drainage redness with a temperature of 101.8 Sunday.  Patient is currently receiving antibiotics with dialysis.  Past Medical History:  Diagnosis Date   AICD (automatic cardioverter/defibrillator) present    [employer]   Allergic rhinitis    Anemia    Arthritis    Chronic systolic heart failure ([place])    a. ECHO (12/2012) EF 25-30%, HK entireanteroseptal myocardium //  b.  EF 25%, diffuse HK, grade 1 diastolic dysfunction, MAC, mild LAE, normal RVSF, trivial pericardial effusion   COPD (chronic obstructive pulmonary disease) ([place])    Diabetes mellitus type II    Diabetic nephropathy ([place])    Diabetic neuropathy ([place])    ESRD on hemodialysis ([place])    started HD June 2017, goes to [facility] HD unit, Dr Hinda Lenis   History of cardiac catheterization    a.Myoview 1/15:  There is significant left ventricular dysfunction. There may be slight scar at the apex. There is no significant ischemia. LV Ejection Fraction: 27%  //  b. RHC/LHC (1/15) with mean RA 6, PA 47/22 mean 33, mean PCWP 20, PVR 2.5 WU, CI 2.5; 80% dLAD stenosis, 70% diffuse large D.    History of kidney stones    Hyperlipidemia    Hypertension    Kidney stones    NICM (nonischemic cardiomyopathy) ([place])    Primarily nonischemic. Echo  (12/14) with EF 25-30%. Echo (3/15) with EF 25%, mild to moderately dilated LV, normal RV size and systolic function.    Osteomyelitis ([place])    left fifth ray   Pneumonia    Urethral stricture    Wears glasses     Past Surgical History:  Procedure Laterality Date   ABDOMINAL AORTOGRAM W/LOWER EXTREMITY N/A 03/30/2016   Procedure: Abdominal Aortogram w/Lower Extremity;  Surgeon: Angelia Mould, MD;  Location: [place] [facility];  Service: Cardiovascular;  Laterality: N/A;   AMPUTATION Right 04/26/2016   Procedure: Right Below Knee Amputation;  Surgeon: Newt Minion, MD;  Location: [place];  Service: Orthopedics;  Laterality: Right;   AV FISTULA PLACEMENT Right 09/08/2015   Procedure: INSERTION OF 4-44m x 45cm  ARTERIOVENOUS (AV) GORE-TEX GRAFT RIGHT UPPER  ARM;  Surgeon: CAngelia Mould MD;  [place]  Service: Vascular;  Laterality: Right;   AV FISTULA PLACEMENT Left 01/14/2016   Procedure: CREATION OF LEFT UPPER ARM ARTERIOVENOUS FISTULA;  Surgeon: CAngelia Mould MD;  [place]  Service: Vascular;  Laterality: Left;   BRepublicRight 08/22/2014   Procedure: RIGHT UPPER ARM [place]  Surgeon: CAngelia Mould MD;  [place]: [place]  Service: Vascular;  Laterality: Right;   BELOW KNEE LEG AMPUTATION Right 04/26/2016   CARDIAC CATHETERIZATION     CARDIAC DEFIBRILLATOR PLACEMENT  06/27/2013   Sub Q       BY DR KCaryl Comes  CATARACT EXTRACTION W/PHACO Right 08/06/2018   Procedure: CATARACT EXTRACTION PHACO AND INTRAOCULAR LENS PLACEMENT (IOC);  Surgeon: Baruch Goldmann, MD;  Location: [facility];  Service: Ophthalmology;  Laterality: Right;  CDE: 4.06   CATARACT EXTRACTION W/PHACO Left 08/20/2018   Procedure: CATARACT EXTRACTION PHACO AND INTRAOCULAR LENS PLACEMENT (IOC);  Surgeon: Baruch Goldmann, MD;  Location: [facility];  Service: Ophthalmology;  Laterality: Left;  CDE: 6.76   COLONOSCOPY WITH PROPOFOL N/A 07/22/2015    Procedure: COLONOSCOPY WITH PROPOFOL;  Surgeon: Doran Stabler, MD;  Location: [facility];  Service: Gastroenterology;  Laterality: N/A;   FEMORAL-POPLITEAL BYPASS GRAFT Right 03/31/2016   Procedure: BYPASS GRAFT FEMORAL-POPLITEAL ARTERY USING RIGHT GREATER SAPHENOUS NONREVERSED VEIN;  Surgeon: Angelia Mould, MD;  Location: [place];  Service: Vascular;  Laterality: Right;   HERNIA REPAIR     I & D EXTREMITY Right 03/31/2016   Procedure: IRRIGATION AND DEBRIDEMENT FOOT;  Surgeon: Angelia Mould, MD;  Location: [place];  Service: Vascular;  Laterality: Right;   IMPLANTABLE CARDIOVERTER DEFIBRILLATOR IMPLANT N/A 06/27/2013   Procedure: SUB Q ICD;  Surgeon: Deboraha Sprang, MD;  Location: [facility] CATH LAB;  Service: Cardiovascular;  Laterality: N/A;   INTRAOPERATIVE ARTERIOGRAM Right 03/31/2016   Procedure: INTRA OPERATIVE ARTERIOGRAM;  Surgeon: Angelia Mould, MD;  Location: [place];  Service: Vascular;  Laterality: Right;   IR GENERIC HISTORICAL Right 11/30/2015   IR THROMBECTOMY AV FISTULA W/THROMBOLYSIS/PTA INC/SHUNT/IMG RIGHT 11/30/2015 Aletta Edouard, MD [facility]   IR GENERIC HISTORICAL  11/30/2015   IR US GUIDE VASC ACCESS RIGHT 11/30/2015 Aletta Edouard, MD [facility]   IR GENERIC HISTORICAL Right 12/15/2015   IR THROMBECTOMY AV FISTULA W/THROMBOLYSIS/PTA/STENT INC/SHUNT/IMG RT 12/15/2015 Arne Cleveland, MD [facility]   IR GENERIC HISTORICAL  12/15/2015   IR US GUIDE VASC ACCESS RIGHT 12/15/2015 Arne Cleveland, MD [facility]   IR GENERIC HISTORICAL  12/28/2015   IR FLUORO GUIDE CV LINE RIGHT 12/28/2015 Marybelle Killings, MD [facility]   IR GENERIC HISTORICAL  12/28/2015   IR US GUIDE VASC ACCESS RIGHT 12/28/2015 Marybelle Killings, MD [facility]   LEFT A ND RIGHT HEART CATH  01/30/2013   DR Sung Amabile   LEFT AND RIGHT HEART CATHETERIZATION WITH CORONARY ANGIOGRAM N/A 01/30/2013   Procedure: LEFT AND RIGHT HEART CATHETERIZATION WITH CORONARY ANGIOGRAM;  Surgeon:  Jolaine Artist, MD;  Location: [facility] CATH LAB;  Service: Cardiovascular;  Laterality: N/A;   PERIPHERAL VASCULAR CATHETERIZATION Right 01/26/2015   Procedure: A/V Fistulagram;  Surgeon: Angelia Mould, MD;  Location: [place] [facility];  Service: Cardiovascular;  Laterality: Right;   reapea urethral surgery for recurrent obstruction  2011   TOTAL KNEE ARTHROPLASTY Right 2007   VEIN HARVEST Right 03/31/2016   Procedure: RIGHT GREATER SAPHENOUS VEIN HARVEST;  Surgeon: Angelia Mould, MD;  Location: [place];  Service: Vascular;  Laterality: Right;    Family History  Problem Relation Age of Onset   Bladder Cancer Mother    Alcohol abuse Father    Melanoma Father    Stroke Maternal Grandmother    Heart Problems Maternal Grandmother        unknown   Diabetes Maternal Grandmother    Heart disease Maternal Grandfather    Prostate cancer Maternal Grandfather    Social History:  reports that he quit smoking about 9 years ago. His smoking use included cigarettes. He has a 64.00 pack-year smoking history. He has never used smokeless tobacco. He reports that he does not drink  alcohol and does not use drugs.  Allergies:  Allergies  Allergen Reactions   Epoetin Alfa    Ferumoxytol    Morphine Sulfate Rash and Other (See Comments)    Itches all over, red spots    No medications prior to admission.    No results found for this or any previous visit (from the past 48 hour(s)). XR Foot Complete Left  Result Date: 08/20/2019 Three-view radiographs of the left foot shows extensive calcification of the arteries out to the toes with air in the soft tissue around the fifth metatarsal head there are no destructive bony changes to the fifth metatarsal head.   Review of Systems  All other systems reviewed and are negative.   There were no vitals taken for this visit. Physical Exam  Patient is alert, oriented, no adenopathy, well-dressed, normal affect, normal respiratory  effort. Examination patient does not have a palpable pulse at the ankle Doppler was used and he has a dampened monophasic dorsalis pedis and posterior tibial pulse there is cellulitis extending from the fifth metatarsal head dorsally proximal to the ankle.  There is a necrotic wound surrounding the fifth metatarsal head with no exposed bone or tendon the ulcer is 4 cm in diameter.  Hemoglobin A1c most recently 8.1.Heart RRR Lungs Clear Assessment/Plan 1. Foot ulcer, left, limited to breakdown of skin ([place])   2. ESRD (end stage renal disease) on dialysis ([place])   3. Gangrene of left foot ([place])     Plan: Patient has a necrotic ulcer over the fifth metatarsal head will need to plan for a left foot fifth ray amputation.  Discussed that with the calcified microcirculation patient may have difficulty healing.  We will plan for outpatient surgery with discharge with a wound VAC he will need to continue with his antibiotics with dialysis.   Bevely Palmer Stassi Fadely, PA 08/21/2019, 6:45 AM

## 2019-08-21 NOTE — Anesthesia Preprocedure Evaluation (Addendum)
Anesthesia Evaluation  Patient identified by MRN, date of birth, ID band Patient awake    Reviewed: Allergy & Precautions, NPO status , Patient's Chart, lab work & pertinent test results  Airway Mallampati: II  TM Distance: >3 FB Neck ROM: Full    Dental  (+) Teeth Intact, Dental Advisory Given   Pulmonary COPD,  COPD inhaler, former smoker,    breath sounds clear to auscultation       Cardiovascular hypertension, Pt. on home beta blockers + CAD, + Peripheral Vascular Disease and +CHF  + Cardiac Defibrillator  Rhythm:Regular Rate:Normal     Neuro/Psych PSYCHIATRIC DISORDERS  Neuromuscular disease    GI/Hepatic negative GI ROS, Neg liver ROS,   Endo/Other  diabetes, Type 2, Insulin Dependent  Renal/GU ESRF and DialysisRenal disease     Musculoskeletal  (+) Arthritis ,   Abdominal Normal abdominal exam  (+)   Peds  Hematology   Anesthesia Other Findings   Reproductive/Obstetrics                           Anesthesia Physical Anesthesia Plan  ASA: III  Anesthesia Plan: General   Post-op Pain Management:    Induction: Intravenous  PONV Risk Score and Plan: 3 and Ondansetron, Dexamethasone and Midazolam  Airway Management Planned: LMA  Additional Equipment: None  Intra-op Plan:   Post-operative Plan: Extubation in OR  Informed Consent: I have reviewed the patients History and Physical, chart, labs and discussed the procedure including the risks, benefits and alternatives for the proposed anesthesia with the patient or authorized representative who has indicated his/her understanding and acceptance.     Dental advisory given  Plan Discussed with: CRNA  Anesthesia Plan Comments: (PAT note by Karoline Caldwell, PA-C: Follows with cardiology for chronic systolic heart failure/NICM, [employer] ICD in place, CAD (cath 2015 with diffuse nonobstructive disease, worst lesion 80% small  distal LAD.) Echo in 2015 LVEF 25% - up to 55% by echo 2018. Last seen by Dr. Harl Bowie 08/09/19, noted some CHF meds had been decreased due to low BPs on HD.  Recently admitted 8/7-08/18/19 for fever of unknown origin.  Treated with empiric antibiotics.  IDDMII, last A1c 8.1 on 07/10/19.  He has longstanding peripheral vascular disease and is s/p right transtibial amputation.  ESRD on HD Tuesday Thursday Saturday via [facility]. Followed by Dr. Theador Hawthorne.  Will need day of surgery labs and eval.  EKG 08/17/2019: Sinus tach.  Rate 114.  Borderline LAD.  Periop device orders: Device Information:  Clinic EP Physician:Steven Caryl Comes, MD Device Type:Defibrillator Manufacturer and Phone #:[employer]: [phone] Pacemaker Dependent?:No Date of Last Device Check:6/16/2021Normal Device Function?:Yes  Electrophysiologist's Recommendations:  Have magnet available. Provide continuous ECG monitoring when magnet is used or reprogramming is to be performed. Procedure should not interfere with device function.  No device programming or magnet placement needed.   PORTABLE CHEST 1 VIEW 08/18/2019: COMPARISON:  08/17/2019  FINDINGS: RIGHT-sided dialysis catheter tip overlies the superior vena cava. Subcutaneous defibrillator lead is stable in appearance accounting for differences in position. Heart is enlarged and stable in configuration. There is minimal subsegmental atelectasis at the LEFT lung base. No new consolidations.  IMPRESSION: 1. Stable cardiomegaly. 2. No new consolidations.   TTE 09/09/16: - Left ventricle: The cavity size was mildly dilated. Wall  thickness was increased increased in a pattern of mild to  moderate LVH. The estimated ejection fraction was 55%. Wall  motion was normal; there were no  regional wall motion  abnormalities. Doppler parameters are consistent with abnormal  left ventricular relaxation (grade 1 diastolic  dysfunction).  - Mitral valve: Mildly calcified annulus. There was trivial  regurgitation.  - Right ventricle: Pacer wire or catheter noted in right ventricle.  - Right atrium: Central venous pressure (est): 3 mm Hg.  - Tricuspid valve: There was physiologic regurgitation.  - Pulmonary arteries: Systolic pressure could not be accurately  estimated.  - Pericardium, extracardiac: There was no pericardial effusion.   Impressions:   - Mild to moderate LVH with mild chamber dilatation and LVEF 55%.  Grade 1 diastolic dysfunction. Mildly calcified mitral annulus  with trivial mitral regurgitation. Device wire present within the  right heart.   Nuclear stress 04/21/2016: Impression: 1.  Moderate sized fixed defect seen involving inferior myocardium consistent with old infarction.  No definite reversibility is noted. 2.  Mild global hypomotility is noted. 3.  Left ventricular ejection fraction 42%. 4.  Noninvasive stratification: Intermediate.  )    Anesthesia Quick Evaluation

## 2019-08-21 NOTE — Transfer of Care (Signed)
Immediate Anesthesia Transfer of Care Note  Patient: Anthony Bullock  Procedure(s) Performed: LEFT FOOT 5TH RAY AMPUTATION (Left )  Patient Location: PACU  Anesthesia Type:General  Level of Consciousness: awake and oriented  Airway & Oxygen Therapy: Patient Spontanous Breathing  Post-op Assessment: Report given to RN  Post vital signs: Reviewed and stable  Last Vitals:  Vitals Value Taken Time  BP    Temp    Pulse 75 08/21/19 1825  Resp 21 08/21/19 1825  SpO2 94 % 08/21/19 1825  Vitals shown include unvalidated device data.  Last Pain:  Vitals:   08/21/19 1609  PainSc: 6       Patients Stated Pain Goal: 3 (53/91/22 5834)  Complications: No complications documented.

## 2019-08-21 NOTE — Anesthesia Procedure Notes (Signed)
Procedure Name: LMA Insertion Date/Time: 08/21/2019 5:57 PM Performed by: Barrington Ellison, CRNA Pre-anesthesia Checklist: Patient identified, Emergency Drugs available, Suction available and Patient being monitored Patient Re-evaluated:Patient Re-evaluated prior to induction Oxygen Delivery Method: Circle System Utilized Preoxygenation: Pre-oxygenation with 100% oxygen Induction Type: IV induction Ventilation: Mask ventilation without difficulty LMA: LMA inserted LMA Size: 5.0 Number of attempts: 1 Placement Confirmation: positive ETCO2 Tube secured with: Tape Dental Injury: Teeth and Oropharynx as per pre-operative assessment

## 2019-08-21 NOTE — Progress Notes (Signed)
PERIOPERATIVE PRESCRIPTION FOR IMPLANTED CARDIAC DEVICE PROGRAMMING   Patient Information: Name: Anthony Bullock, Anthony Bullock  DOB: 1960-01-13   MRN: [id number] Planned Procedure: Left Foot Fifth Ray Amputation  Surgeon: Dr. Meridee Score  Date of Procedure: 08/21/2019  Cautery will be used.  Position during surgery:   Please send documentation back to:  [facility] (Fax # [phone])   Catalina Pizza, RN  08/20/2019 5:33 PM      Device Information:   Clinic EP Physician:   Virl Axe, MD Device Type:  Defibrillator Manufacturer and Phone #:  [employer]: [phone] Pacemaker Dependent?:  No Date of Last Device Check:  06/26/2019        Normal Device Function?:  Yes     Electrophysiologist's Recommendations:    Have magnet available.  Provide continuous ECG monitoring when magnet is used or reprogramming is to be performed.   Procedure should not interfere with device function.  No device programming or magnet placement needed.  Per Device Clinic Standing Orders, Drake Leach  08/21/2019 7:58 AM

## 2019-08-21 NOTE — Progress Notes (Signed)
Anesthesia Chart Review: Same day workup  Follows with cardiology for chronic systolic heart failure/NICM, [employer] ICD in place, CAD (cath 2015 with diffuse nonobstructive disease, worst lesion 80% small distal LAD.) Echo in 2015 LVEF 25% - up to 55% by echo 2018. Last seen by Dr. Harl Bowie 08/09/19, noted some CHF meds had been decreased due to low BPs on HD.  Recently admitted 8/7-08/18/19 for fever of unknown origin.  Treated with empiric antibiotics.  IDDMII, last A1c 8.1 on 07/10/19.  He has longstanding peripheral vascular disease and is s/p right transtibial amputation.  ESRD on HD Tuesday Thursday Saturday via [facility]. Followed by Dr. Theador Hawthorne.  Will need day of surgery labs and eval.  EKG 08/17/2019: Sinus tach.  Rate 114.  Borderline LAD.  Periop device orders: Device Information:  Clinic EP Physician:Steven Caryl Comes, MD Device Type:Defibrillator Manufacturer and Phone #:[employer]: [phone] Pacemaker Dependent?:No Date of Last Device Check:6/16/2021Normal Device Function?:Yes  Electrophysiologist's Recommendations:   Have magnet available.  Provide continuous ECG monitoring when magnet is used or reprogramming is to be performed.  Procedure should not interfere with device function.  No device programming or magnet placement needed.   PORTABLE CHEST 1 VIEW 08/18/2019: COMPARISON:  08/17/2019  FINDINGS: RIGHT-sided dialysis catheter tip overlies the superior vena cava. Subcutaneous defibrillator lead is stable in appearance accounting for differences in position. Heart is enlarged and stable in configuration. There is minimal subsegmental atelectasis at the LEFT lung base. No new consolidations.  IMPRESSION: 1. Stable cardiomegaly. 2. No new consolidations.   TTE 09/09/16: - Left ventricle: The cavity size was mildly dilated. Wall  thickness was increased increased in a pattern of mild to  moderate LVH. The  estimated ejection fraction was 55%. Wall  motion was normal; there were no regional wall motion  abnormalities. Doppler parameters are consistent with abnormal  left ventricular relaxation (grade 1 diastolic dysfunction).  - Mitral valve: Mildly calcified annulus. There was trivial  regurgitation.  - Right ventricle: Pacer wire or catheter noted in right ventricle.  - Right atrium: Central venous pressure (est): 3 mm Hg.  - Tricuspid valve: There was physiologic regurgitation.  - Pulmonary arteries: Systolic pressure could not be accurately  estimated.  - Pericardium, extracardiac: There was no pericardial effusion.   Impressions:   - Mild to moderate LVH with mild chamber dilatation and LVEF 55%.  Grade 1 diastolic dysfunction. Mildly calcified mitral annulus  with trivial mitral regurgitation. Device wire present within the  right heart.   Nuclear stress 04/21/2016: Impression: 1.  Moderate sized fixed defect seen involving inferior myocardium consistent with old infarction.  No definite reversibility is noted. 2.  Mild global hypomotility is noted. 3.  Left ventricular ejection fraction 42%. 4.  Noninvasive stratification: Intermediate.   Wynonia Musty [facility] Short Stay Center/Anesthesiology Phone [phone] 08/21/2019 9:08 AM

## 2019-08-21 NOTE — Op Note (Addendum)
08/21/2019  6:22 PM  PATIENT:  Anthony Bullock    PRE-OPERATIVE DIAGNOSIS:  gangrene left foot fifth toe  POST-OPERATIVE DIAGNOSIS:  Same  PROCEDURE:  LEFT FOOT 5TH RAY AMPUTATION Local tissue rearrangement for wound closure 3 x 9 cm.  SURGEON:  Newt Minion, MD  PHYSICIAN ASSISTANT:None ANESTHESIA:   General  PREOPERATIVE INDICATIONS:  Anthony Bullock is a  59 y.o. male with a diagnosis of gangrene left foot who failed conservative measures and elected for surgical management.    The risks benefits and alternatives were discussed with the patient preoperatively including but not limited to the risks of infection, bleeding, nerve injury, cardiopulmonary complications, the need for revision surgery, among others, and the patient was willing to proceed.  OPERATIVE IMPLANTS: none  @ENCIMAGES @  OPERATIVE FINDINGS: Mild ischemic skin changes  OPERATIVE PROCEDURE: Patient brought the operating room and underwent a general anesthetic.  After adequate levels anesthesia were obtained patient's left lower extremity was prepped using DuraPrep draped into a sterile field a timeout was called.  A racquet incision was made around the ulcerative tissue the fifth ray was resected through the base.  All nonviable tissue was excised there were no abscesses.  Local tissue rearrangement was used to close the wound 3 x 9 cm.  The wound was closed using 2-0 nylon.  A sterile dressing was applied patient was taken to the PACU in stable condition.   DISCHARGE PLANNING:  Antibiotic duration: Preoperative antibiotics  Weightbearing: Touchdown weightbearing on the left  Pain medication: Prescription for Percocet  Dressing care/ Wound VAC: Follow-up in the office in 1 week to change the dressing  Ambulatory devices: Walker or crutches  Discharge to: Home.  Follow-up: In the office 1 week post operative.

## 2019-08-22 DIAGNOSIS — Z992 Dependence on renal dialysis: Secondary | ICD-10-CM | POA: Diagnosis not present

## 2019-08-22 DIAGNOSIS — R509 Fever, unspecified: Secondary | ICD-10-CM | POA: Diagnosis not present

## 2019-08-22 DIAGNOSIS — N186 End stage renal disease: Secondary | ICD-10-CM | POA: Diagnosis not present

## 2019-08-22 DIAGNOSIS — D509 Iron deficiency anemia, unspecified: Secondary | ICD-10-CM | POA: Diagnosis not present

## 2019-08-22 DIAGNOSIS — D631 Anemia in chronic kidney disease: Secondary | ICD-10-CM | POA: Diagnosis not present

## 2019-08-22 DIAGNOSIS — N2581 Secondary hyperparathyroidism of renal origin: Secondary | ICD-10-CM | POA: Diagnosis not present

## 2019-08-22 NOTE — Anesthesia Postprocedure Evaluation (Signed)
Anesthesia Post Note  Patient: MICHA DOSANJH  Procedure(s) Performed: LEFT FOOT 5TH RAY AMPUTATION (Left )     Patient location during evaluation: PACU Anesthesia Type: General Level of consciousness: awake and alert Pain management: pain level controlled Vital Signs Assessment: post-procedure vital signs reviewed and stable Respiratory status: spontaneous breathing, nonlabored ventilation, respiratory function stable and patient connected to nasal cannula oxygen Cardiovascular status: blood pressure returned to baseline and stable Postop Assessment: no apparent nausea or vomiting Anesthetic complications: no   No complications documented.  Last Vitals:  Vitals:   08/21/19 1825 08/21/19 1840  BP: (!) 115/59 134/61  Pulse: 75 79  Resp: 20 19  Temp: 36.7 C 36.8 C  SpO2: 94% 95%    Last Pain:  Vitals:   08/21/19 1840  PainSc: 0-No pain                 Effie Berkshire

## 2019-08-23 ENCOUNTER — Telehealth: Payer: Self-pay | Admitting: Orthopedic Surgery

## 2019-08-23 ENCOUNTER — Encounter (HOSPITAL_COMMUNITY): Payer: Self-pay | Admitting: Orthopedic Surgery

## 2019-08-23 LAB — CULTURE, BLOOD (SINGLE)
Culture: NO GROWTH
Special Requests: ADEQUATE

## 2019-08-23 NOTE — Telephone Encounter (Signed)
Pts wife would like a CB to let her know wether she'll have to take out from work? Pts wife would like a CB before the 18th

## 2019-08-24 DIAGNOSIS — D509 Iron deficiency anemia, unspecified: Secondary | ICD-10-CM | POA: Diagnosis not present

## 2019-08-24 DIAGNOSIS — R509 Fever, unspecified: Secondary | ICD-10-CM | POA: Diagnosis not present

## 2019-08-24 DIAGNOSIS — Z992 Dependence on renal dialysis: Secondary | ICD-10-CM | POA: Diagnosis not present

## 2019-08-24 DIAGNOSIS — D631 Anemia in chronic kidney disease: Secondary | ICD-10-CM | POA: Diagnosis not present

## 2019-08-24 DIAGNOSIS — N186 End stage renal disease: Secondary | ICD-10-CM | POA: Diagnosis not present

## 2019-08-24 DIAGNOSIS — N2581 Secondary hyperparathyroidism of renal origin: Secondary | ICD-10-CM | POA: Diagnosis not present

## 2019-08-26 NOTE — Telephone Encounter (Signed)
This is for the pt's wife leave to care for husband while recovering from surgery. Advised to have the [facility] fax the paperwork to my attention and that I would complete this for her and will also hold this message so that I can notify when it has been completed and faxed.

## 2019-08-27 DIAGNOSIS — D509 Iron deficiency anemia, unspecified: Secondary | ICD-10-CM | POA: Diagnosis not present

## 2019-08-27 DIAGNOSIS — N186 End stage renal disease: Secondary | ICD-10-CM | POA: Diagnosis not present

## 2019-08-27 DIAGNOSIS — R509 Fever, unspecified: Secondary | ICD-10-CM | POA: Diagnosis not present

## 2019-08-27 DIAGNOSIS — D631 Anemia in chronic kidney disease: Secondary | ICD-10-CM | POA: Diagnosis not present

## 2019-08-27 DIAGNOSIS — N2581 Secondary hyperparathyroidism of renal origin: Secondary | ICD-10-CM | POA: Diagnosis not present

## 2019-08-27 DIAGNOSIS — Z992 Dependence on renal dialysis: Secondary | ICD-10-CM | POA: Diagnosis not present

## 2019-08-27 NOTE — Telephone Encounter (Signed)
Can you get these back from [employer] and I will complete.

## 2019-08-27 NOTE — Telephone Encounter (Signed)
Ic,lmvm asking for forms to be sent back over.

## 2019-08-28 ENCOUNTER — Ambulatory Visit (INDEPENDENT_AMBULATORY_CARE_PROVIDER_SITE_OTHER): Payer: Medicare Other | Admitting: Physician Assistant

## 2019-08-28 ENCOUNTER — Encounter: Payer: Self-pay | Admitting: Physician Assistant

## 2019-08-28 VITALS — Ht 70.0 in | Wt 210.0 lb

## 2019-08-28 DIAGNOSIS — L97521 Non-pressure chronic ulcer of other part of left foot limited to breakdown of skin: Secondary | ICD-10-CM

## 2019-08-28 NOTE — Progress Notes (Signed)
Office Visit Note   Patient: Anthony Bullock           Date of Birth: 03-24-1960           MRN: [id number] Visit Date: 08/28/2019              Requested by: [name], MD [place],  [place] [address] PCP: [name], MD  Chief Complaint  Patient presents with  . Left Foot - Routine Post Op    08/21/19 left foot 5th ray amputation       HPI: The patient is 8 days status post Left foot 5th amputation.  Overall he is doing well.  He did have some fatigue following dialysis which his wife was concerned about and was hoping that he did not have an infection but he is much better today  Assessment & Plan: Visit Diagnoses: No diagnosis found.  Plan: Patient will do daily of the wounds and dry dressings.  He should remain nonweightbearing given the small ulcer on his heel.  If he elevates his foot he simply heel to hang free.  Follow-Up Instructions: No follow-ups on file.   Ortho Exam  Patient is alert, oriented, no adenopathy, well-dressed, normal affect, normal respiratory effort. Focused examination well-healing surgical wound well apposed wound edges some bloody drainage.  No evidence of necrosis no surrounding cellulitis minimal soft tissue swelling.  No signs of infection.  He does have any small 1 diameter ulcer on his heel.  There is no drainage no foul odor or cellulitis.  Does not probe deeply  Imaging: No results found. No images are attached to the encounter.  Labs: Lab Results  Component Value Date   HGBA1C 8.1 (H) 07/10/2019   HGBA1C 8.9 (H) 08/06/2018   HGBA1C 11.6 02/15/2018   ESRSEDRATE 94 (H) 03/30/2016   ESRSEDRATE 18 11/30/2012   CRP 16.1 (H) 03/30/2016   REPTSTATUS 08/23/2019 FINAL 08/17/2019   GRAMSTAIN  03/31/2016    RARE WBC PRESENT, PREDOMINANTLY PMN NO ORGANISMS SEEN    CULT  08/17/2019    NO GROWTH 6 DAYS Performed at [facility], [address] [place], [place] [address]    [place] 03/31/2016   LABORGA ENTEROBACTER SPECIES 03/31/2016     Lab Results  Component Value Date   ALBUMIN 2.9 (L) 08/18/2019   ALBUMIN 3.9 08/17/2019   ALBUMIN 3.5 07/01/2019    Lab Results  Component Value Date   MG 1.7 08/18/2019   MG 2.1 12/03/2014   MG 2.1 11/04/2014   No results found for: VD25OH  No results found for: PREALBUMIN CBC EXTENDED Latest Ref Rng & Units 08/21/2019 08/18/2019 08/17/2019  WBC 4.0 - 10.5 K/uL - 7.3 9.2  RBC 4.22 - 5.81 MIL/uL - 4.03(L) 4.46  HGB 13.0 - 17.0 g/dL 11.2(L) 11.4(L) 12.7(L)  HCT 39 - 52 % 33.0(L) 37.1(L) 40.0  PLT 150 - 400 K/uL - 120(L) 138(L)  NEUTROABS 1.7 - 7.7 K/uL - 6.2 8.3(H)  LYMPHSABS 0.7 - 4.0 K/uL - 0.5(L) 0.2(L)     Body mass index is 30.13 kg/m.  Orders:  No orders of the defined types were placed in this encounter.  No orders of the defined types were placed in this encounter.    Procedures: No procedures performed  Clinical Data: No additional findings.  ROS:  All other systems negative, except as noted in the HPI. Review of Systems  Objective: Vital Signs: Ht _0  (1.778 m)  Wt 210 lb (95.3 kg)   BMI 30.13 kg/m   Specialty Comments:  No specialty comments available.  PMFS History: Patient Active Problem List   Diagnosis Date Noted  . Osteomyelitis of fifth toe of left foot ([place])   . Febrile illness 08/17/2019  . SIRS (systemic inflammatory response syndrome) ([place]) 08/17/2019  . Thrombocytopenia ([place]) 08/17/2019  . Pressure injury of skin 08/17/2019  . Hypertensive crisis 07/10/2019  . Onychomycosis 08/16/2016  . Status post unilateral below knee amputation, right ([place]) 04/26/2016  . Bilateral carotid bruits 03/30/2016  . Diabetes mellitus with complication ([place])   . Anemia due to chronic kidney disease   . ESRD (end stage renal disease) on dialysis ([place]) 11/30/2015  . Acute respiratory failure with hypoxia ([place]) 11/29/2015  . At high risk for falls 09/29/2014  . Peripheral  vascular disease ([place]) 09/29/2014  . Osteoarthritis of both knees 01/07/2014  . Osteoarthritis of multiple joints 10/06/2013  . NICM (nonischemic cardiomyopathy) ([place]) 06/27/2013  . CAD (coronary artery disease) 03/13/2013  . Abnormal nuclear stress test 01/27/2013  . Chronic systolic congestive heart failure ([place]) 01/27/2013  . COPD (chronic obstructive pulmonary disease) ([place]) 12/27/2012  . Diabetic peripheral neuropathy ([place]) 02/10/2012  . URINARY CALCULUS 09/28/2009  . Adjustment disorder with depressed mood 08/21/2009  . Poorly controlled type II diabetes mellitus with renal complication ([place]) 72/53/6644  . Hyperlipidemia 04/14/2008  . Essential hypertension 04/14/2008  . ALLERGIC RHINITIS 04/14/2008   Past Medical History:  Diagnosis Date  . 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 Allergic rhinitis   . Anemia   . Arthritis   . Chronic systolic heart failure ([place])    a. ECHO (12/2012) EF 25-30%, HK entireanteroseptal myocardium //  b.  EF 25%, diffuse HK, grade 1 diastolic dysfunction, MAC, mild LAE, normal RVSF, trivial pericardial effusion  . COPD (chronic obstructive pulmonary disease) ([place])   . Diabetes mellitus type II   . Diabetic nephropathy ([place])   . Diabetic neuropathy ([place])   . ESRD on hemodialysis [facility])    started HD June 2017, goes to [facility] HD unit, Dr Hinda Lenis  . History of cardiac catheterization    a.Myoview 1/15:  There is significant left ventricular dysfunction. There may be slight scar at the apex. There is no significant ischemia. LV Ejection Fraction: 27%  //  b. RHC/LHC (1/15) with mean RA 6, PA 47/22 mean 33, mean PCWP 20, PVR 2.5 WU, CI 2.5; 80% dLAD stenosis, 70% diffuse large D.   . History of kidney stones   . Hyperlipidemia   . Hypertension   . Kidney stones   . NICM (nonischemic cardiomyopathy) ([place])    Primarily nonischemic. Echo (12/14) with EF 25-30%. Echo (3/15) with EF 25%, mild to moderately  dilated LV, normal RV size and systolic function.   . Osteomyelitis ([place])    left fifth ray  . Pneumonia   . Urethral stricture   . Wears glasses     Family History  Problem Relation Age of Onset  . Bladder Cancer Mother   . Alcohol abuse Father   . Melanoma Father   . Stroke Maternal Grandmother   . Heart Problems Maternal Grandmother        unknown  . Diabetes Maternal Grandmother   . Heart disease Maternal Grandfather   . Prostate cancer Maternal Grandfather     Past Surgical History:  Procedure Laterality Date  . ABDOMINAL AORTOGRAM W/LOWER EXTREMITY N/A 03/30/2016   Procedure: Abdominal Aortogram  w/Lower Extremity;  Surgeon: Angelia Mould, MD;  Location: [place] [facility];  Service: Cardiovascular;  Laterality: N/A;  . AMPUTATION Right 04/26/2016   Procedure: Right Below Knee Amputation;  Surgeon: Newt Minion, MD;  Location: [place];  Service: Orthopedics;  Laterality: Right;  . AMPUTATION Left 08/21/2019   Procedure: LEFT FOOT 5TH RAY AMPUTATION;  Surgeon: Newt Minion, MD;  Location: [place];  Service: Orthopedics;  Laterality: Left;  . AV FISTULA PLACEMENT Right 09/08/2015   Procedure: INSERTION OF 4-83m x 45cm  ARTERIOVENOUS (AV) GORE-TEX GRAFT RIGHT UPPER  ARM;  Surgeon: CAngelia Mould MD;  [place] [facility]  Service: Vascular;  Laterality: Right;  . AV FISTULA PLACEMENT Left 01/14/2016   Procedure: CREATION OF LEFT UPPER ARM ARTERIOVENOUS FISTULA;  Surgeon: CAngelia Mould MD;  [place]  Service: Vascular;  Laterality: Left;  . BASCILIC VEIN TRANSPOSITION Right 08/22/2014   Procedure: RIGHT UPPER ARM BASCILIC VEIN TRANSPOSITION;  Surgeon: CAngelia Mould MD;  [place] [facility]  Service: Vascular;  Laterality: Right;  . BELOW KNEE LEG AMPUTATION Right 04/26/2016  . CARDIAC CATHETERIZATION    . CARDIAC DEFIBRILLATOR PLACEMENT  06/27/2013   Sub Q       BY DR KCaryl Comes . CATARACT EXTRACTION W/PHACO Right 08/06/2018   Procedure: CATARACT  EXTRACTION PHACO AND INTRAOCULAR LENS PLACEMENT (IOC);  Surgeon: WBaruch Goldmann MD;  Location[facility];  Service: Ophthalmology;  Laterality: Right;  CDE: 4.06  . CATARACT EXTRACTION W/PHACO Left 08/20/2018   Procedure: CATARACT EXTRACTION PHACO AND INTRAOCULAR LENS PLACEMENT (IOC);  Surgeon: WBaruch Goldmann MD;  Location: [facility];  Service: Ophthalmology;  Laterality: Left;  CDE: 6.76  . COLONOSCOPY WITH PROPOFOL N/A 07/22/2015   Procedure: COLONOSCOPY WITH PROPOFOL;  Surgeon: HDoran Stabler MD;  [facility];  Service: Gastroenterology;  Laterality: N/A;  . FEMORAL-POPLITEAL BYPASS GRAFT Right 03/31/2016   Procedure: BYPASS GRAFT FEMORAL-POPLITEAL ARTERY USING RIGHT GREATER SAPHENOUS NONREVERSED VEIN;  Surgeon: CAngelia Mould MD;  [place] [place]  Service: Vascular;  Laterality: Right;  . HERNIA REPAIR    . I & D EXTREMITY Right 03/31/2016   Procedure: IRRIGATION AND DEBRIDEMENT FOOT;  Surgeon: CAngelia Mould MD;  [place]  Service: Vascular;  Laterality: Right;  . IMPLANTABLE CARDIOVERTER DEFIBRILLATOR IMPLANT N/A 06/27/2013   Procedure: SUB Q ICD;  Surgeon: SDeboraha Sprang MD;  [facility];  Service: Cardiovascular;  Laterality: N/A;  . INTRAOPERATIVE ARTERIOGRAM Right 03/31/2016   Procedure: INTRA OPERATIVE ARTERIOGRAM;  Surgeon: CAngelia Mould MD;  [place] [facility]  Service: Vascular;  Laterality: Right;  . IR GENERIC HISTORICAL Right 11/30/2015   IR THROMBECTOMY AV FISTULA W/THROMBOLYSIS/PTA INC/SHUNT/IMG RIGHT 11/30/2015 GAletta Edouard MD [facility]  . IR GENERIC HISTORICAL  11/30/2015   IR [place] VASC ACCESS RIGHT 11/30/2015 GAletta Edouard MD [facility]  . IR GENERIC HISTORICAL Right 12/15/2015   IR THROMBECTOMY AV FISTULA W/THROMBOLYSIS/PTA/STENT INC/SHUNT/IMG RT 12/15/2015 DArne Cleveland MD [facility]  . IR GENERIC HISTORICAL  12/15/2015   IR [place] VASC ACCESS RIGHT 12/15/2015 DArne Cleveland MD [facility]  . IR  GENERIC HISTORICAL  12/28/2015   IR FLUORO GUIDE CV LINE RIGHT 12/28/2015 AMarybelle Killings MD [facility]  . IR GENERIC HISTORICAL  12/28/2015   IR [place] VASC ACCESS RIGHT 12/28/2015 AMarybelle Killings MD [facility]  . LEFT A ND RIGHT HEART CATH  01/30/2013   DR BSung Amabile . LEFT AND RIGHT HEART  CATHETERIZATION WITH CORONARY ANGIOGRAM N/A 01/30/2013   Procedure: LEFT AND RIGHT HEART CATHETERIZATION WITH CORONARY ANGIOGRAM;  Surgeon: Jolaine Artist, MD;  Location: [facility] CATH LAB;  Service: Cardiovascular;  Laterality: N/A;  . PERIPHERAL VASCULAR CATHETERIZATION Right 01/26/2015   Procedure: A/V Fistulagram;  Surgeon: Angelia Mould, MD;  Location: [place] CV LAB;  Service: Cardiovascular;  Laterality: Right;  . reapea urethral surgery for recurrent obstruction  2011  . TOTAL KNEE ARTHROPLASTY Right 2007  . VEIN HARVEST Right 03/31/2016   Procedure: RIGHT GREATER SAPHENOUS VEIN HARVEST;  Surgeon: Angelia Mould, MD;  Location: [place];  Service: Vascular;  Laterality: Right;   Social History   Occupational History  . Not on file  Tobacco Use  . Smoking status: Former Smoker    Packs/day: 2.00    Years: 32.00    Pack years: 64.00    Types: Cigarettes    Quit date: 05/11/2010    Years since quitting: 9.3  . Smokeless tobacco: Never Used  Vaping Use  . Vaping Use: Never used  Substance and Sexual Activity  . Alcohol use: No  . Drug use: No  . Sexual activity: Yes

## 2019-08-29 DIAGNOSIS — R509 Fever, unspecified: Secondary | ICD-10-CM | POA: Diagnosis not present

## 2019-08-29 DIAGNOSIS — D631 Anemia in chronic kidney disease: Secondary | ICD-10-CM | POA: Diagnosis not present

## 2019-08-29 DIAGNOSIS — N186 End stage renal disease: Secondary | ICD-10-CM | POA: Diagnosis not present

## 2019-08-29 DIAGNOSIS — Z992 Dependence on renal dialysis: Secondary | ICD-10-CM | POA: Diagnosis not present

## 2019-08-29 DIAGNOSIS — N2581 Secondary hyperparathyroidism of renal origin: Secondary | ICD-10-CM | POA: Diagnosis not present

## 2019-08-29 DIAGNOSIS — D509 Iron deficiency anemia, unspecified: Secondary | ICD-10-CM | POA: Diagnosis not present

## 2019-08-30 ENCOUNTER — Other Ambulatory Visit: Payer: Self-pay

## 2019-08-30 ENCOUNTER — Emergency Department (HOSPITAL_COMMUNITY)
Admission: EM | Admit: 2019-08-30 | Discharge: 2019-08-31 | Disposition: A | Discharge status: Home / Self Care | Payer: Medicare Other | Source: Home / Self Care | Attending: Emergency Medicine | Admitting: Emergency Medicine

## 2019-08-30 ENCOUNTER — Telehealth: Payer: Self-pay

## 2019-08-30 ENCOUNTER — Emergency Department (HOSPITAL_COMMUNITY): Payer: Medicare Other

## 2019-08-30 ENCOUNTER — Encounter (HOSPITAL_COMMUNITY): Payer: Self-pay | Admitting: Pediatrics

## 2019-08-30 DIAGNOSIS — M7732 Calcaneal spur, left foot: Secondary | ICD-10-CM | POA: Diagnosis not present

## 2019-08-30 DIAGNOSIS — E1140 Type 2 diabetes mellitus with diabetic neuropathy, unspecified: Secondary | ICD-10-CM | POA: Insufficient documentation

## 2019-08-30 DIAGNOSIS — I5022 Chronic systolic (congestive) heart failure: Secondary | ICD-10-CM | POA: Insufficient documentation

## 2019-08-30 DIAGNOSIS — J90 Pleural effusion, not elsewhere classified: Secondary | ICD-10-CM

## 2019-08-30 DIAGNOSIS — Z87891 Personal history of nicotine dependence: Secondary | ICD-10-CM | POA: Insufficient documentation

## 2019-08-30 DIAGNOSIS — I132 Hypertensive heart and chronic kidney disease with heart failure and with stage 5 chronic kidney disease, or end stage renal disease: Secondary | ICD-10-CM | POA: Insufficient documentation

## 2019-08-30 DIAGNOSIS — S98132A Complete traumatic amputation of one left lesser toe, initial encounter: Secondary | ICD-10-CM | POA: Diagnosis not present

## 2019-08-30 DIAGNOSIS — N186 End stage renal disease: Secondary | ICD-10-CM | POA: Insufficient documentation

## 2019-08-30 DIAGNOSIS — R069 Unspecified abnormalities of breathing: Secondary | ICD-10-CM | POA: Diagnosis not present

## 2019-08-30 DIAGNOSIS — I10 Essential (primary) hypertension: Secondary | ICD-10-CM | POA: Diagnosis not present

## 2019-08-30 DIAGNOSIS — R509 Fever, unspecified: Secondary | ICD-10-CM | POA: Insufficient documentation

## 2019-08-30 DIAGNOSIS — R0602 Shortness of breath: Secondary | ICD-10-CM | POA: Diagnosis not present

## 2019-08-30 DIAGNOSIS — Z794 Long term (current) use of insulin: Secondary | ICD-10-CM | POA: Insufficient documentation

## 2019-08-30 DIAGNOSIS — Z7982 Long term (current) use of aspirin: Secondary | ICD-10-CM | POA: Insufficient documentation

## 2019-08-30 DIAGNOSIS — Z96651 Presence of right artificial knee joint: Secondary | ICD-10-CM | POA: Insufficient documentation

## 2019-08-30 DIAGNOSIS — E1122 Type 2 diabetes mellitus with diabetic chronic kidney disease: Secondary | ICD-10-CM | POA: Insufficient documentation

## 2019-08-30 DIAGNOSIS — Z955 Presence of coronary angioplasty implant and graft: Secondary | ICD-10-CM | POA: Insufficient documentation

## 2019-08-30 DIAGNOSIS — Z992 Dependence on renal dialysis: Secondary | ICD-10-CM | POA: Insufficient documentation

## 2019-08-30 DIAGNOSIS — R000 Tachycardia, unspecified: Secondary | ICD-10-CM | POA: Diagnosis not present

## 2019-08-30 DIAGNOSIS — I517 Cardiomegaly: Secondary | ICD-10-CM | POA: Diagnosis not present

## 2019-08-30 DIAGNOSIS — I2510 Atherosclerotic heart disease of native coronary artery without angina pectoris: Secondary | ICD-10-CM | POA: Insufficient documentation

## 2019-08-30 DIAGNOSIS — Z89422 Acquired absence of other left toe(s): Secondary | ICD-10-CM | POA: Diagnosis not present

## 2019-08-30 DIAGNOSIS — Z20822 Contact with and (suspected) exposure to covid-19: Secondary | ICD-10-CM | POA: Insufficient documentation

## 2019-08-30 DIAGNOSIS — R531 Weakness: Secondary | ICD-10-CM | POA: Diagnosis not present

## 2019-08-30 DIAGNOSIS — Z79899 Other long term (current) drug therapy: Secondary | ICD-10-CM | POA: Insufficient documentation

## 2019-08-30 DIAGNOSIS — Z6830 Body mass index (BMI) 30.0-30.9, adult: Secondary | ICD-10-CM | POA: Diagnosis not present

## 2019-08-30 LAB — CBC WITH DIFFERENTIAL/PLATELET
Abs Immature Granulocytes: 0.09 10*3/uL — ABNORMAL HIGH (ref 0.00–0.07)
Basophils Absolute: 0.1 10*3/uL (ref 0.0–0.1)
Basophils Relative: 1 %
Eosinophils Absolute: 0.1 10*3/uL (ref 0.0–0.5)
Eosinophils Relative: 1 %
HCT: 31.5 % — ABNORMAL LOW (ref 39.0–52.0)
Hemoglobin: 9.6 g/dL — ABNORMAL LOW (ref 13.0–17.0)
Immature Granulocytes: 1 %
Lymphocytes Relative: 6 %
Lymphs Abs: 0.8 10*3/uL (ref 0.7–4.0)
MCH: 27.4 pg (ref 26.0–34.0)
MCHC: 30.5 g/dL (ref 30.0–36.0)
MCV: 90 fL (ref 80.0–100.0)
Monocytes Absolute: 1.2 10*3/uL — ABNORMAL HIGH (ref 0.1–1.0)
Monocytes Relative: 9 %
Neutro Abs: 12 10*3/uL — ABNORMAL HIGH (ref 1.7–7.7)
Neutrophils Relative %: 82 %
Platelets: 179 10*3/uL (ref 150–400)
RBC: 3.5 MIL/uL — ABNORMAL LOW (ref 4.22–5.81)
RDW: 15.1 % (ref 11.5–15.5)
WBC: 14.3 10*3/uL — ABNORMAL HIGH (ref 4.0–10.5)
nRBC: 0 % (ref 0.0–0.2)

## 2019-08-30 LAB — COMPREHENSIVE METABOLIC PANEL
ALT: 15 U/L (ref 0–44)
AST: 24 U/L (ref 15–41)
Albumin: 2.6 g/dL — ABNORMAL LOW (ref 3.5–5.0)
Alkaline Phosphatase: 58 U/L (ref 38–126)
Anion gap: 13 (ref 5–15)
BUN: 34 mg/dL — ABNORMAL HIGH (ref 6–20)
CO2: 25 mmol/L (ref 22–32)
Calcium: 8.6 mg/dL — ABNORMAL LOW (ref 8.9–10.3)
Chloride: 94 mmol/L — ABNORMAL LOW (ref 98–111)
Creatinine, Ser: 9.23 mg/dL — ABNORMAL HIGH (ref 0.61–1.24)
GFR calc Af Amer: 7 mL/min — ABNORMAL LOW (ref >60)
GFR calc non Af Amer: 6 mL/min — ABNORMAL LOW (ref >60)
Glucose, Bld: 218 mg/dL — ABNORMAL HIGH (ref 70–99)
Potassium: 4.2 mmol/L (ref 3.5–5.1)
Sodium: 132 mmol/L — ABNORMAL LOW (ref 135–145)
Total Bilirubin: 1.4 mg/dL — ABNORMAL HIGH (ref 0.3–1.2)
Total Protein: 7 g/dL (ref 6.5–8.1)

## 2019-08-30 LAB — SARS CORONAVIRUS 2 BY RT PCR (HOSPITAL ORDER, PERFORMED IN ~~LOC~~ HOSPITAL LAB): SARS Coronavirus 2: NEGATIVE

## 2019-08-30 NOTE — ED Triage Notes (Signed)
C/o generalized aches and weakness along w/ fever.

## 2019-08-30 NOTE — Telephone Encounter (Signed)
Bevely Palmer calling pt's wife to discuss.

## 2019-08-30 NOTE — Telephone Encounter (Signed)
Seen in office Wednesday for his post op check and was not feeling good. Was seen Thursday at dialysis and after that became more ill- Wife states he has fever, nausea, vomitting, coughing, weakness, shallow breathing, elevated pulse.  They called 911 and they suggested to take patient to an UC.  She took patient to UC and was tested for COVID and currently results are pending but the provider at [facility] suggested to proceed to ER  Stated that his foot appeared more red today than it did on Wednesday.  She wanted to make sure that you were aware.

## 2019-08-30 NOTE — Telephone Encounter (Signed)
I called and advised pt that his wife's paperwork for the [facility] has been completed and faxed. Will call with any other questions.

## 2019-08-30 NOTE — Telephone Encounter (Signed)
I contacted Anthony Bullock the patient's wife.  She stated that Anthony Bullock had gotten worse after his visit with [name].  He also began having more redness and drainage in the fourth toe on his foot.  He was seen in urgent care but then referred to the emergency room.  She was on her way with him to [facility], ER as we spoke.  I did tell her that I would make Dr. Sharol Given aware

## 2019-08-31 ENCOUNTER — Other Ambulatory Visit: Payer: Self-pay

## 2019-08-31 ENCOUNTER — Emergency Department (HOSPITAL_COMMUNITY): Payer: Medicare Other

## 2019-08-31 DIAGNOSIS — R509 Fever, unspecified: Secondary | ICD-10-CM | POA: Diagnosis not present

## 2019-08-31 DIAGNOSIS — M7732 Calcaneal spur, left foot: Secondary | ICD-10-CM | POA: Diagnosis not present

## 2019-08-31 DIAGNOSIS — S98132A Complete traumatic amputation of one left lesser toe, initial encounter: Secondary | ICD-10-CM | POA: Diagnosis not present

## 2019-08-31 DIAGNOSIS — N186 End stage renal disease: Secondary | ICD-10-CM | POA: Diagnosis not present

## 2019-08-31 DIAGNOSIS — N2581 Secondary hyperparathyroidism of renal origin: Secondary | ICD-10-CM | POA: Diagnosis not present

## 2019-08-31 DIAGNOSIS — D509 Iron deficiency anemia, unspecified: Secondary | ICD-10-CM | POA: Diagnosis not present

## 2019-08-31 DIAGNOSIS — Z89422 Acquired absence of other left toe(s): Secondary | ICD-10-CM | POA: Diagnosis not present

## 2019-08-31 DIAGNOSIS — D631 Anemia in chronic kidney disease: Secondary | ICD-10-CM | POA: Diagnosis not present

## 2019-08-31 DIAGNOSIS — Z992 Dependence on renal dialysis: Secondary | ICD-10-CM | POA: Diagnosis not present

## 2019-08-31 LAB — CBG MONITORING, ED: Glucose-Capillary: 91 mg/dL (ref 70–99)

## 2019-08-31 NOTE — Discharge Instructions (Addendum)
You were seen today in the ED for fever  X-ray of the foot does not reveal obvious active bone destruction or infection.  Chest x-ray showed some fluid around lungs but no infection.  Covid test was negative.  We were unable to obtain a urine sample.  Your port did not appear to be infected today  You are on some very strong antibiotics that cover a lot of bacterial infections in the body.  We were unable to determine the cause of your fever.  This could be just a virus.  We have obtained blood cultures that are pending to determine if there is a true infection in your bloodstream.  Continue to monitor your symptoms.  Return to the ED immediately if you develop any sudden or new symptoms of infection like vomiting, abdominal pain, diarrhea, cough, chest pain or shortness of breath, or worsening redness discharge pus or bleeding from the surgical wound  Go to your orthopedic appointment on 8/25  Please arrange urgent dialysis in the next 24 to 48 hours.  Return to the ED for increased body swelling, shortness of breath, chest pain, cough  Please talk to your nephrologist about the antibiotics you are currently taking, and how long you need them for.

## 2019-08-31 NOTE — ED Notes (Addendum)
Pt makes minimal urine

## 2019-08-31 NOTE — ED Notes (Signed)
Wife called in concerned about pt blood sugar and making sure his leg is elevate due to amputated toe.Please call Manuela Schwartz (wife) back with an update at [phone]

## 2019-08-31 NOTE — ED Notes (Signed)
Wife called concerned about pt being in lobby and that blood sugar may be low.  Pt brought back to triage and explained delay for treatment room.  Pt alert and oriented.  CBG 91.  Pt informed he is longest wait based on time and should be next for treatment room if higher acuity pt doesn't arrive.  Pt verbalized understanding.  NAD at this time.

## 2019-08-31 NOTE — ED Provider Notes (Signed)
[facility] EMERGENCY DEPARTMENT Provider Note   CSN: [id number] Arrival date & time: 08/30/19  1642     History Chief Complaint  Patient presents with   Generalized Body Aches    Anthony Bullock is a 59 y.o. male with medical history of non obstructive CAD, HLD, systolic CHF EF 40%, [employer] ICD,, type 2 DM on insulin, ESRD on HD TuThSa presents to the ED for evaluation of fever.  States fever has been fluctuating and as high as 101 orally at home ever since his left toe amputation on 08/21/2019 by Dr. Sharol Given.  Patient saw orthopedic PA on Wednesday 8/18 in the office who told him that his surgical wound appeared to be okay and did not think it was infected.  He went to hemodialysis on Thursday 8/19 and had a full session, and when he arrived home he had one episode of vomiting with a dry cough, shortness of breath that he describes as "pain with breathing" and having a harder time breathing.  States all of the symptoms resolved the next day and now feels okay.  Has been treating his Tylenol at home with Tylenol.  He is concerned about a specific area on his surgical wound that looks more red.  Has associated mixed small amount of bloody/yellow drainage.  Denies significant pain.  Denies calf pain or swelling, chest pain, hemoptysis.  Fully vaccinated for COVID.  Denies any other symptoms to suggest infection including abdominal pain, diarrhea.  No longer having nausea or vomiting.  No longer cough or Sob. No CP.  Has had slow decreased in urine output due to HD but has noticed more decrease in urine output slowly in the last 1 week. However denies dysuria, flank pain, hematuria.  Has been compliant with his medicines including diuretic 40 mg 3 times daily. Of note patient has been in ER waiting for for 18 hours.   HPI     Past Medical History:  Diagnosis Date   AICD (automatic cardioverter/defibrillator) present    boston [employer]   Allergic rhinitis    Anemia      Arthritis    Chronic systolic heart failure ([place])    a. ECHO (12/2012) EF 25-30%, HK entireanteroseptal myocardium //  b.  EF 25%, diffuse HK, grade 1 diastolic dysfunction, MAC, mild LAE, normal RVSF, trivial pericardial effusion   COPD (chronic obstructive pulmonary disease) ([place])    Diabetes mellitus type II    Diabetic nephropathy ([place])    Diabetic neuropathy ([place])    ESRD on hemodialysis (Home)    started HD June 2017, goes to [facility], Dr Hinda Lenis   History of cardiac catheterization    a.Myoview 1/15:  There is significant left ventricular dysfunction. There may be slight scar at the apex. There is no significant ischemia. LV Ejection Fraction: 27%  //  b. RHC/LHC (1/15) with mean RA 6, PA 47/22 mean 33, mean PCWP 20, PVR 2.5 WU, CI 2.5; 80% dLAD stenosis, 70% diffuse large D.    History of kidney stones    Hyperlipidemia    Hypertension    Kidney stones    NICM (nonischemic cardiomyopathy) ([place])    Primarily nonischemic. Echo (12/14) with EF 25-30%. Echo (3/15) with EF 25%, mild to moderately dilated LV, normal RV size and systolic function.    Osteomyelitis ([place])    left fifth ray   Pneumonia    Urethral stricture    Wears glasses     Patient Active  Problem List   Diagnosis Date Noted   Osteomyelitis of fifth toe of left foot (HCC)    Febrile illness 08/17/2019   SIRS (systemic inflammatory response syndrome) ([place]) 08/17/2019   Thrombocytopenia ([place]) 08/17/2019   Pressure injury of skin 08/17/2019   Hypertensive crisis 07/10/2019   Onychomycosis 08/16/2016   Status post unilateral below knee amputation, right ([place]) 04/26/2016   Bilateral carotid bruits 03/30/2016   Diabetes mellitus with complication (HCC)    Anemia due to chronic kidney disease    ESRD (end stage renal disease) on dialysis ([place]) 11/30/2015   Acute respiratory failure with hypoxia ([place]) 11/29/2015   At high risk for falls 09/29/2014   Peripheral  vascular disease ([place]) 09/29/2014   Osteoarthritis of both knees 01/07/2014   Osteoarthritis of multiple joints 10/06/2013   NICM (nonischemic cardiomyopathy) ([place]) 06/27/2013   CAD (coronary artery disease) 03/13/2013   Abnormal nuclear stress test 98/92/1194   Chronic systolic congestive heart failure ([place]) 01/27/2013   COPD (chronic obstructive pulmonary disease) ([place]) 12/27/2012   Diabetic peripheral neuropathy ([place]) 02/10/2012   URINARY CALCULUS 09/28/2009   Adjustment disorder with depressed mood 08/21/2009   Poorly controlled type II diabetes mellitus with renal complication ([place]) 17/40/8144   Hyperlipidemia 04/14/2008   Essential hypertension 04/14/2008   ALLERGIC RHINITIS 04/14/2008    Past Surgical History:  Procedure Laterality Date   ABDOMINAL AORTOGRAM W/LOWER EXTREMITY N/A 03/30/2016   Procedure: Abdominal Aortogram w/Lower Extremity;  Surgeon: Angelia Mould, MD;  Location: [facility] CV LAB;  Service: Cardiovascular;  Laterality: N/A;   AMPUTATION Right 04/26/2016   Procedure: Right Below Knee Amputation;  Surgeon: Newt Minion, MD;  Location: [facility];  Service: Orthopedics;  Laterality: Right;   AMPUTATION Left 08/21/2019   Procedure: LEFT FOOT 5TH RAY AMPUTATION;  Surgeon: Newt Minion, MD;  Location: [facility];  Service: Orthopedics;  Laterality: Left;   AV FISTULA PLACEMENT Right 09/08/2015   Procedure: INSERTION OF 4-32m x 45cm  ARTERIOVENOUS (AV) GORE-TEX GRAFT RIGHT UPPER  ARM;  Surgeon: CAngelia Mould MD;  [place]  Service: Vascular;  Laterality: Right;   AV FISTULA PLACEMENT Left 01/14/2016   Procedure: CREATION OF LEFT UPPER ARM ARTERIOVENOUS FISTULA;  Surgeon: CAngelia Mould MD;  [place]  Service: Vascular;  Laterality: Left;   [place] 08/22[place]2014   Procedure: RIGHT UPPER ARM [place]  Surgeon: CAngelia Mould MD;  [place]  Service: Vascular;   Laterality: Right;   BELOW KNEE LEG AMPUTATION Right 04/26/2016   CARDIAC CATHETERIZATION     CARDIAC DEFIBRILLATOR PLACEMENT  06/27/2013   Sub Q       BY DR KCaryl Comes  CATARACT EXTRACTION W/PHACO Right 08/06/2018   Procedure: CATARACT EXTRACTION PHACO AND INTRAOCULAR LENS PLACEMENT ([place];  Surgeon: WBaruch Goldmann MD;  Location: [facility];  Service: Ophthalmology;  Laterality: Right;  CDE: 4.06   CATARACT EXTRACTION W/PHACO Left 08/20/2018   Procedure: CATARACT EXTRACTION PHACO AND INTRAOCULAR LENS PLACEMENT (IOC);  Surgeon: WBaruch Goldmann MD;  Location: [facility];  Service: Ophthalmology;  Laterality: Left;  CDE: 6.76   COLONOSCOPY WITH PROPOFOL N/A 07/22/2015   Procedure: COLONOSCOPY WITH PROPOFOL;  Surgeon: HDoran Stabler MD;  Location: [facility];  Service: Gastroenterology;  Laterality: N/A;   FEMORAL-POPLITEAL BYPASS GRAFT Right 03/31/2016   Procedure: BYPASS GRAFT FEMORAL-POPLITEAL ARTERY USING RIGHT GREATER SAPHENOUS NONREVERSED VEIN;  Surgeon: CAngelia Mould MD;  [place] [facility]  Service: Vascular;  Laterality:  Right;   HERNIA REPAIR     I & D EXTREMITY Right 03/31/2016   Procedure: IRRIGATION AND DEBRIDEMENT FOOT;  Surgeon: Angelia Mould, MD;  Location: [facility];  Service: Vascular;  Laterality: Right;   IMPLANTABLE CARDIOVERTER DEFIBRILLATOR IMPLANT N/A 06/27/2013   Procedure: SUB Q ICD;  Surgeon: Deboraha Sprang, MD;  Location: [facility] CATH LAB;  Service: Cardiovascular;  Laterality: N/A;   INTRAOPERATIVE ARTERIOGRAM Right 03/31/2016   Procedure: INTRA OPERATIVE ARTERIOGRAM;  Surgeon: Angelia Mould, MD;  Location: [facility];  Service: Vascular;  Laterality: Right;   IR GENERIC HISTORICAL Right 11/30/2015   IR THROMBECTOMY AV FISTULA W/THROMBOLYSIS/PTA INC/SHUNT/IMG RIGHT 11/30/2015 Aletta Edouard, MD [facility]   IR GENERIC HISTORICAL  11/30/2015   IR US GUIDE VASC ACCESS RIGHT 11/30/2015 Aletta Edouard, MD [facility]   IR GENERIC HISTORICAL Right 12/15/2015    IR THROMBECTOMY AV FISTULA W/THROMBOLYSIS/PTA/STENT INC/SHUNT/IMG RT 12/15/2015 Arne Cleveland, MD [facility]   IR GENERIC HISTORICAL  12/15/2015   IR US GUIDE VASC ACCESS RIGHT 12/15/2015 Arne Cleveland, MD [facility]   IR GENERIC HISTORICAL  12/28/2015   IR FLUORO GUIDE CV LINE RIGHT 12/28/2015 Marybelle Killings, MD [facility]   IR GENERIC HISTORICAL  12/28/2015   IR US GUIDE VASC ACCESS RIGHT 12/28/2015 Marybelle Killings, MD [facility]   LEFT A ND RIGHT HEART CATH  01/30/2013   DR Sung Amabile   LEFT AND RIGHT HEART CATHETERIZATION WITH CORONARY ANGIOGRAM N/A 01/30/2013   Procedure: LEFT AND RIGHT HEART CATHETERIZATION WITH CORONARY ANGIOGRAM;  Surgeon: Jolaine Artist, MD;  Location: [facility] CATH LAB;  Service: Cardiovascular;  Laterality: N/A;   PERIPHERAL VASCULAR CATHETERIZATION Right 01/26/2015   Procedure: A/V Fistulagram;  Surgeon: Angelia Mould, MD;  Location: [facility] CV LAB;  Service: Cardiovascular;  Laterality: Right;   reapea urethral surgery for recurrent obstruction  2011   TOTAL KNEE ARTHROPLASTY Right 2007   VEIN HARVEST Right 03/31/2016   Procedure: RIGHT GREATER SAPHENOUS VEIN HARVEST;  Surgeon: Angelia Mould, MD;  Location: [facility] OR;  Service: Vascular;  Laterality: Right;       Family History  Problem Relation Age of Onset   Bladder Cancer Mother    Alcohol abuse Father    Melanoma Father    Stroke Maternal Grandmother    Heart Problems Maternal Grandmother        unknown   Diabetes Maternal Grandmother    Heart disease Maternal Grandfather    Prostate cancer Maternal Grandfather     Social History   Tobacco Use   Smoking status: Former Smoker    Packs/day: 2.00    Years: 32.00    Pack years: 64.00    Types: Cigarettes    Quit date: 05/11/2010    Years since quitting: 9.3   Smokeless tobacco: Never Used  Vaping Use   Vaping Use: Never used  Substance Use Topics   Alcohol use: No   Drug use: No    Home  Medications Prior to Admission medications   Medication Sig Start Date End Date Taking? Authorizing Provider  albuterol (PROVENTIL HFA;VENTOLIN HFA) 108 (90 Base) MCG/ACT inhaler Inhale 2 puffs into the lungs every 4 (four) hours as needed for wheezing or shortness of breath. 02/16/18   Burchette, Alinda Sierras, MD  aspirin EC 81 MG tablet Take 81 mg by mouth daily.     [provider]  atorvastatin (LIPITOR) 80 MG tablet Take 1 tablet (80 mg total) by mouth at bedtime. 05/08/19  Burchette, Alinda Sierras, MD  azelastine (ASTELIN) 0.1 % nasal spray Place 2 sprays into both nostrils 3 (three) times daily as needed for rhinitis. Use in each nostril as directed 03/10/14   Burchette, Alinda Sierras, MD  carvedilol (COREG) 12.5 MG tablet Take 1 tablet (12.5 mg total) by mouth 2 (two) times daily with a meal. 05/08/19   Burchette, Alinda Sierras, MD  cefTAZidime (FORTAZ) IVPB Inject 2 g into the vein Every Tuesday,Thursday,and Saturday with dialysis. Indication:  bacteremia Last Day of Therapy:  08/31/19 Labs - Once weekly:  CBC/D and BMP, Labs - Every other week:  ESR and CRP Method of administration: IV Push Method of administration may be changed at the discretion of home infusion pharmacist based upon assessment of the patient and/or caregiver's ability to self-administer the medication ordered. 08/20/19   Johnson, Clanford L, MD  Continuous Blood Gluc Sensor (FREESTYLE LIBRE 14 DAY SENSOR) MISC USE AS DIRECTED EVERY 14 DAYS 08/02/19   Burchette, Alinda Sierras, MD  fenofibrate 160 MG tablet Take 1 tablet (160 mg total) by mouth daily. 05/08/19   Burchette, Alinda Sierras, MD  fexofenadine (ALLEGRA) 180 MG tablet Take 180 mg by mouth daily.    [provider]  furosemide (LASIX) 40 MG tablet Take 3 tablets by mouth once daily 08/07/18   Burchette, Alinda Sierras, MD  gabapentin (NEURONTIN) 300 MG capsule Take 2 capsules (600 mg total) by mouth 2 (two) times daily. 05/08/19   Burchette, Alinda Sierras, MD  glucose blood test strip Check 1 time  daily. E11.9 One Touch Ultra Blue Test Strips 03/10/14   [name], MD  Insulin Glargine [facility]) 100 UNIT/ML INJECT 70 UNITS SUBCUTANEOUSLY AT BEDTIME 05/08/19   Burchette, Alinda Sierras, MD  Insulin Lispro (HUMALOG KWIKPEN) 200 UNIT/ML SOPN Inject 10 Units into the skin 3 (three) times daily with meals. Use tid with meals as per sliding scale 03/16/18   Burchette, Alinda Sierras, MD  Insulin Pen Needle (BD PEN NEEDLE NANO U/F) 32G X 4 MM MISC USE 1 PEN NEEDLE SUBCUTANEOUSLY WITH INSULIN 4 TIMES DAILY 12/21/18   Burchette, Alinda Sierras, MD  lanthanum (FOSRENOL) 1000 MG chewable tablet Chew 1,000 mg by mouth 3 (three) times daily with meals. Take one tablet after each meal.     [provider]  multivitamin (RENA-VIT) TABS tablet Take 1 tablet by mouth daily. 03/21/19   Burchette, Alinda Sierras, MD  Olopatadine HCl (PATADAY) 0.2 % SOLN Place 1 drop into both eyes daily as needed (for allergies).     [provider]  oxyCODONE-acetaminophen (PERCOCET) 5-325 MG tablet Take 1 tablet by mouth every 4 (four) hours as needed. 08/21/19 08/20/20  Persons, Bevely Palmer, PA  vancomycin IVPB Inject 2,000 mg into the vein Every Tuesday,Thursday,and Saturday with dialysis. Indication:  bacteremia First Dose: No Last Day of Therapy:  08/31/19 Labs - Sunday/Monday:  CBC/D, BMP, and vancomycin trough. Labs - Thursday:  BMP and vancomycin trough Labs - Every other week:  ESR and CRP Method of administration:Elastomeric Method of administration may be changed at the discretion of the patient and/or caregiver's ability to self-administer the medication ordered. 08/20/19   Johnson, Clanford L, MD    Allergies    Epoetin alfa, Ferumoxytol, and Morphine sulfate  Review of Systems   Review of Systems  Constitutional: Positive for chills and fever.  Respiratory: Positive for cough (resolved) and shortness of breath (resolved).   Gastrointestinal: Positive for vomiting (resolved).  Skin: Positive for wound.  All  other  systems reviewed and are negative.   Physical Exam Updated Vital Signs BP (!) 182/72 (BP Location: Left Arm)    Pulse 93    Temp 97.7 F (36.5 C) (Oral)    Resp 17    Ht _0  (1.778 m)    Wt 96.6 kg    SpO2 100%    BMI 30.56 kg/m   Physical Exam Vitals and nursing note reviewed.  Constitutional:      General: He is not in acute distress.    Appearance: He is well-developed.     Comments: NAD.  HENT:     Head: Normocephalic and atraumatic.     Right Ear: External ear normal.     Left Ear: External ear normal.     Nose: Nose normal.  Eyes:     Conjunctiva/sclera: Conjunctivae normal.  Cardiovascular:     Rate and Rhythm: Normal rate and regular rhythm.     Heart sounds: Normal heart sounds.     Comments: No peripheral edema.  1+ DP pulses bilaterally.  No calf tenderness. Pulmonary:     Effort: Pulmonary effort is normal.     Breath sounds: Normal breath sounds.     Comments: Diminished air sounds in lower lobes bilaterally.  No crackles.  No wheezing.  Normal work of breathing. Chest:     Comments: Right sided chest port with normal skin, no erythema, warmth, fluctuance, bleeding or drainage  Abdominal:     Palpations: Abdomen is soft.     Tenderness: There is no abdominal tenderness.  Musculoskeletal:        General: No deformity. Normal range of motion.     Cervical back: Normal range of motion and neck supple.     Comments: S/p left fifth toe amputation, surgical wound has several stitches in place.  Surgical edges well apposed, scant amount of serous sanguinous/light straw-colored drainage on the bandage.  Minimal warmth, tenderness around the surgical wound mostly distal area.  No significant expansion of redness, warmth or tenderness on the foot or up the leg.  Skin:    General: Skin is warm and dry.     Capillary Refill: Capillary refill takes less than 2 seconds.  Neurological:     Mental Status: He is alert and oriented to person, place, and time.      Comments: Sensation dorsal feet bilaterally intact, strength in ankle bilaterally intact.  Psychiatric:        Behavior: Behavior normal.        Thought Content: Thought content normal.        Judgment: Judgment normal.         ED Results / Procedures / Treatments   Labs (all labs ordered are listed, but only abnormal results are displayed) Labs Reviewed  CBC WITH DIFFERENTIAL/PLATELET - Abnormal; Notable for the following components:      Result Value   WBC 14.3 (*)    RBC 3.50 (*)    Hemoglobin 9.6 (*)    HCT 31.5 (*)    Neutro Abs 12.0 (*)    Monocytes Absolute 1.2 (*)    Abs Immature Granulocytes 0.09 (*)    All other components within normal limits  COMPREHENSIVE METABOLIC PANEL - Abnormal; Notable for the following components:   Sodium 132 (*)    Chloride 94 (*)    Glucose, Bld 218 (*)    BUN 34 (*)    Creatinine, Ser 9.23 (*)    Calcium 8.6 (*)    Albumin 2.6 (*)  Total Bilirubin 1.4 (*)    GFR calc non Af Amer 6 (*)    GFR calc Af Amer 7 (*)    All other components within normal limits  SARS CORONAVIRUS 2 BY RT PCR (HOSPITAL ORDER, [facility])  CULTURE, BLOOD (ROUTINE X 2)  CULTURE, BLOOD (ROUTINE X 2)  URINE CULTURE  URINALYSIS, ROUTINE W REFLEX MICROSCOPIC  CBG MONITORING, ED    EKG None  Radiology DG Chest 2 View  Result Date: 08/30/2019 CLINICAL DATA:  Weakness EXAM: CHEST - 2 VIEW COMPARISON:  08/18/2019, 07/01/2019 FINDINGS: Small bilateral pleural effusions, left greater than right. Right-sided central venous catheter tip over the distal SVC. Similar subcutaneous deferred relate ower lead placement. Borderline cardiomegaly. No consolidation or pneumothorax. IMPRESSION: Small bilateral pleural effusions, left greater than right. Electronically Signed   By: Donavan Foil M.D.   On: 08/30/2019 20:07   DG Foot Complete Left  Result Date: 08/31/2019 CLINICAL DATA:  Fever.  Recent 5th toe amputation. EXAM: LEFT FOOT -  COMPLETE 3+ VIEW COMPARISON:  08/20/2019 radiographs FINDINGS: Transmetatarsal amputation of the 5th digit is noted. Small bony fragments at the amputation sites are noted. No definite evidence of acute osteomyelitis noted. A small plantar calcaneal spur and diffuse vascular calcifications are again noted. No subluxation present. IMPRESSION: 5th transmetatarsal amputation without definite evidence of acute osteomyelitis. Electronically Signed   By: Margarette Canada M.D.   On: 08/31/2019 11:24    Procedures Procedures (including critical care time)  Medications Ordered in ED Medications - No data to display  ED Course  I have reviewed the triage vital signs and the nursing notes.  Pertinent labs & imaging results that were available during my care of the patient were reviewed by me and considered in my medical decision making (see chart for details).  Clinical Course as of Aug 30 1228  Sat Aug 31, 2019  1033 WBC(!): 14.3 [CG]  1033 Hemoglobin(!): 9.6 [CG]  1033 Creatinine(!): 9.23 [CG]  1033 Glucose(!): 218 [CG]  1033 BUN(!): 34 [CG]  1033 GFR, Est Non African American(!): 6 [CG]  1033 Total Bilirubin(!): 1.4 [CG]  1229 Prolonged Qtc mild 507, not significantly changed from previous   EKG 12-Lead [CG]    Clinical Course User Index [CG] Arlean Hopping   MDM Rules/Calculators/A&P                          59 year old male with complex past medical history presents for fluctuating fevers since his left fifth toe amputation on 8/11.  His primary concern seems to be an infection around the surgical wound.  Patient has been in the ED for 20 hours in the waiting room and missed his scheduled HD session this morning.  One episode of vomiting, dry cough and shortness of breath, all resolved now.    I reviewed patient's EMR, triage and nursing notes.  Admitted on 8/7 for fever/SIRS.  No source of infection was found.  Hospitalist concern for port as a potential source but contacted Dr.  Moshe Cipro with nephrology who did not think this was infected at that time.  Exam today of the port does not reveal any obvious signs of cellulitis, infection.  Patient has had no issues using his port for dialysis.  Has been getting ceftazidime and vancomycin per nephrology with each HD treatment, today being his last dose.    Denies any other symptoms to point to an infection like congestion,  sore throat, abdominal pain, diarrhea.  No longer having cough or shortness of breath or chest pain.  No longer having vomiting or nausea.  Decreased urine output in the last week but no dysuria.  Patient in the ED for 20 hours and no documented fever, tachycardia, tachypnea, hypoxia.  ER work-up initiated in triage.  These were personally visualized and interpreted.  ER work-up reveals leukocytosis WBC 14.3, previously 7.3.  Hemoglobin 9.6, previously 11.4.  However, previous labs were obtained the day of surgery, possibly postop anemia/stress response.  Creatinine is 9.23, higher than his baseline but reflecting missing his dialysis this morning.  Potassium is 4.2.  EKG without any acute changes.  Chest x-ray shows small bilateral pleural effusions again possibly related to missing dialysis however patient is no longer short of breath and vital signs normal.  No respiratory distress.  No lab findings to warrant emergent dialysis.  Covid test is negative.  Patient unable to provide urinalysis here but he denies UTI symptoms.  Foot x-ray with post-op findings but no active osteo, soft tissue normal.  Clinically no evidence of wound dehiscence, cellulitis, abscess or early infection.    Given lack of fever in the ED for 20+ hours, benign VS and clinical exam I do not think further emergent lab work or admission is indicated.  I do not think emergent dialysis necessary, will encourage patient to arrange dialysis for later today at his center.  Discussed with EDP who agrees with plan of care.  Patient has scheduled  Ortho appointment in 4 days.  Return precautions discussed. Final Clinical Impression(s) / ED Diagnoses Final diagnoses:  Intermittent fever of unknown origin  Pleural effusion, bilateral    Rx / DC Orders ED Discharge Orders    None       Kinnie Feil, PA-C 08/31/19 1230    Malvin Johns, MD 08/31/19 1345

## 2019-09-02 ENCOUNTER — Emergency Department (HOSPITAL_COMMUNITY): Payer: Medicare Other

## 2019-09-02 ENCOUNTER — Inpatient Hospital Stay (HOSPITAL_COMMUNITY)
Admission: EM | Admit: 2019-09-02 | Discharge: 2019-09-04 | DRG: 291 | Disposition: A | Discharge status: Home / Self Care | Payer: Medicare Other | Attending: Internal Medicine | Admitting: Internal Medicine

## 2019-09-02 ENCOUNTER — Encounter (HOSPITAL_COMMUNITY): Payer: Self-pay | Admitting: Emergency Medicine

## 2019-09-02 ENCOUNTER — Other Ambulatory Visit: Payer: Self-pay

## 2019-09-02 ENCOUNTER — Other Ambulatory Visit: Payer: Self-pay | Admitting: Family Medicine

## 2019-09-02 DIAGNOSIS — Z9841 Cataract extraction status, right eye: Secondary | ICD-10-CM

## 2019-09-02 DIAGNOSIS — R509 Fever, unspecified: Secondary | ICD-10-CM | POA: Diagnosis present

## 2019-09-02 DIAGNOSIS — Z794 Long term (current) use of insulin: Secondary | ICD-10-CM

## 2019-09-02 DIAGNOSIS — Z89422 Acquired absence of other left toe(s): Secondary | ICD-10-CM

## 2019-09-02 DIAGNOSIS — I161 Hypertensive emergency: Secondary | ICD-10-CM | POA: Diagnosis present

## 2019-09-02 DIAGNOSIS — R0902 Hypoxemia: Secondary | ICD-10-CM | POA: Diagnosis not present

## 2019-09-02 DIAGNOSIS — I2510 Atherosclerotic heart disease of native coronary artery without angina pectoris: Secondary | ICD-10-CM | POA: Diagnosis present

## 2019-09-02 DIAGNOSIS — E1140 Type 2 diabetes mellitus with diabetic neuropathy, unspecified: Secondary | ICD-10-CM | POA: Diagnosis present

## 2019-09-02 DIAGNOSIS — R0602 Shortness of breath: Secondary | ICD-10-CM | POA: Diagnosis not present

## 2019-09-02 DIAGNOSIS — Z888 Allergy status to other drugs, medicaments and biological substances status: Secondary | ICD-10-CM

## 2019-09-02 DIAGNOSIS — Z20822 Contact with and (suspected) exposure to covid-19: Secondary | ICD-10-CM | POA: Diagnosis present

## 2019-09-02 DIAGNOSIS — I428 Other cardiomyopathies: Secondary | ICD-10-CM | POA: Diagnosis present

## 2019-09-02 DIAGNOSIS — Z992 Dependence on renal dialysis: Secondary | ICD-10-CM

## 2019-09-02 DIAGNOSIS — E1169 Type 2 diabetes mellitus with other specified complication: Secondary | ICD-10-CM | POA: Diagnosis present

## 2019-09-02 DIAGNOSIS — R069 Unspecified abnormalities of breathing: Secondary | ICD-10-CM | POA: Diagnosis not present

## 2019-09-02 DIAGNOSIS — E1151 Type 2 diabetes mellitus with diabetic peripheral angiopathy without gangrene: Secondary | ICD-10-CM | POA: Diagnosis present

## 2019-09-02 DIAGNOSIS — I132 Hypertensive heart and chronic kidney disease with heart failure and with stage 5 chronic kidney disease, or end stage renal disease: Principal | ICD-10-CM | POA: Diagnosis present

## 2019-09-02 DIAGNOSIS — Z961 Presence of intraocular lens: Secondary | ICD-10-CM | POA: Diagnosis present

## 2019-09-02 DIAGNOSIS — Z7982 Long term (current) use of aspirin: Secondary | ICD-10-CM

## 2019-09-02 DIAGNOSIS — Z79899 Other long term (current) drug therapy: Secondary | ICD-10-CM

## 2019-09-02 DIAGNOSIS — E8889 Other specified metabolic disorders: Secondary | ICD-10-CM | POA: Diagnosis present

## 2019-09-02 DIAGNOSIS — E871 Hypo-osmolality and hyponatremia: Secondary | ICD-10-CM | POA: Diagnosis present

## 2019-09-02 DIAGNOSIS — Z9842 Cataract extraction status, left eye: Secondary | ICD-10-CM

## 2019-09-02 DIAGNOSIS — Z89511 Acquired absence of right leg below knee: Secondary | ICD-10-CM

## 2019-09-02 DIAGNOSIS — D631 Anemia in chronic kidney disease: Secondary | ICD-10-CM | POA: Diagnosis present

## 2019-09-02 DIAGNOSIS — E785 Hyperlipidemia, unspecified: Secondary | ICD-10-CM | POA: Diagnosis present

## 2019-09-02 DIAGNOSIS — E118 Type 2 diabetes mellitus with unspecified complications: Secondary | ICD-10-CM

## 2019-09-02 DIAGNOSIS — N2581 Secondary hyperparathyroidism of renal origin: Secondary | ICD-10-CM | POA: Diagnosis present

## 2019-09-02 DIAGNOSIS — J810 Acute pulmonary edema: Secondary | ICD-10-CM

## 2019-09-02 DIAGNOSIS — Z96651 Presence of right artificial knee joint: Secondary | ICD-10-CM | POA: Diagnosis present

## 2019-09-02 DIAGNOSIS — I517 Cardiomegaly: Secondary | ICD-10-CM | POA: Diagnosis not present

## 2019-09-02 DIAGNOSIS — I5043 Acute on chronic combined systolic (congestive) and diastolic (congestive) heart failure: Secondary | ICD-10-CM | POA: Diagnosis present

## 2019-09-02 DIAGNOSIS — Z885 Allergy status to narcotic agent status: Secondary | ICD-10-CM

## 2019-09-02 DIAGNOSIS — M1990 Unspecified osteoarthritis, unspecified site: Secondary | ICD-10-CM | POA: Diagnosis present

## 2019-09-02 DIAGNOSIS — E1122 Type 2 diabetes mellitus with diabetic chronic kidney disease: Secondary | ICD-10-CM | POA: Diagnosis present

## 2019-09-02 DIAGNOSIS — E1165 Type 2 diabetes mellitus with hyperglycemia: Secondary | ICD-10-CM | POA: Diagnosis present

## 2019-09-02 DIAGNOSIS — Z95810 Presence of automatic (implantable) cardiac defibrillator: Secondary | ICD-10-CM

## 2019-09-02 DIAGNOSIS — Z833 Family history of diabetes mellitus: Secondary | ICD-10-CM

## 2019-09-02 DIAGNOSIS — Z87891 Personal history of nicotine dependence: Secondary | ICD-10-CM

## 2019-09-02 DIAGNOSIS — Z87442 Personal history of urinary calculi: Secondary | ICD-10-CM

## 2019-09-02 DIAGNOSIS — N186 End stage renal disease: Secondary | ICD-10-CM | POA: Diagnosis present

## 2019-09-02 DIAGNOSIS — J9601 Acute respiratory failure with hypoxia: Secondary | ICD-10-CM | POA: Diagnosis present

## 2019-09-02 DIAGNOSIS — J309 Allergic rhinitis, unspecified: Secondary | ICD-10-CM | POA: Diagnosis present

## 2019-09-02 DIAGNOSIS — J449 Chronic obstructive pulmonary disease, unspecified: Secondary | ICD-10-CM | POA: Diagnosis present

## 2019-09-02 DIAGNOSIS — R0689 Other abnormalities of breathing: Secondary | ICD-10-CM | POA: Diagnosis not present

## 2019-09-02 NOTE — ED Triage Notes (Addendum)
Pt from home via [facility]. Pt C/O SOB that started around 2100 tonight. Pt has dialysis tomorrow. Denies pain. EMS reports pts O2 was 85% on RA. Pt placed on 4L  O2 100%.

## 2019-09-03 ENCOUNTER — Emergency Department (HOSPITAL_COMMUNITY): Payer: Medicare Other

## 2019-09-03 ENCOUNTER — Inpatient Hospital Stay (HOSPITAL_COMMUNITY): Payer: Medicare Other

## 2019-09-03 DIAGNOSIS — R509 Fever, unspecified: Secondary | ICD-10-CM | POA: Diagnosis present

## 2019-09-03 DIAGNOSIS — N250 Renal osteodystrophy: Secondary | ICD-10-CM | POA: Diagnosis not present

## 2019-09-03 DIAGNOSIS — N186 End stage renal disease: Secondary | ICD-10-CM

## 2019-09-03 DIAGNOSIS — E8889 Other specified metabolic disorders: Secondary | ICD-10-CM | POA: Diagnosis present

## 2019-09-03 DIAGNOSIS — J9601 Acute respiratory failure with hypoxia: Secondary | ICD-10-CM | POA: Diagnosis not present

## 2019-09-03 DIAGNOSIS — E8770 Fluid overload, unspecified: Secondary | ICD-10-CM | POA: Diagnosis not present

## 2019-09-03 DIAGNOSIS — I428 Other cardiomyopathies: Secondary | ICD-10-CM | POA: Diagnosis not present

## 2019-09-03 DIAGNOSIS — I161 Hypertensive emergency: Secondary | ICD-10-CM

## 2019-09-03 DIAGNOSIS — E1169 Type 2 diabetes mellitus with other specified complication: Secondary | ICD-10-CM | POA: Diagnosis present

## 2019-09-03 DIAGNOSIS — I120 Hypertensive chronic kidney disease with stage 5 chronic kidney disease or end stage renal disease: Secondary | ICD-10-CM | POA: Diagnosis not present

## 2019-09-03 DIAGNOSIS — Z20822 Contact with and (suspected) exposure to covid-19: Secondary | ICD-10-CM | POA: Diagnosis present

## 2019-09-03 DIAGNOSIS — I132 Hypertensive heart and chronic kidney disease with heart failure and with stage 5 chronic kidney disease, or end stage renal disease: Secondary | ICD-10-CM | POA: Diagnosis present

## 2019-09-03 DIAGNOSIS — I5033 Acute on chronic diastolic (congestive) heart failure: Secondary | ICD-10-CM | POA: Diagnosis not present

## 2019-09-03 DIAGNOSIS — Z961 Presence of intraocular lens: Secondary | ICD-10-CM | POA: Diagnosis present

## 2019-09-03 DIAGNOSIS — N2581 Secondary hyperparathyroidism of renal origin: Secondary | ICD-10-CM | POA: Diagnosis present

## 2019-09-03 DIAGNOSIS — Z96651 Presence of right artificial knee joint: Secondary | ICD-10-CM | POA: Diagnosis present

## 2019-09-03 DIAGNOSIS — J449 Chronic obstructive pulmonary disease, unspecified: Secondary | ICD-10-CM | POA: Diagnosis present

## 2019-09-03 DIAGNOSIS — I5031 Acute diastolic (congestive) heart failure: Secondary | ICD-10-CM | POA: Diagnosis not present

## 2019-09-03 DIAGNOSIS — I517 Cardiomegaly: Secondary | ICD-10-CM | POA: Diagnosis not present

## 2019-09-03 DIAGNOSIS — E1122 Type 2 diabetes mellitus with diabetic chronic kidney disease: Secondary | ICD-10-CM | POA: Diagnosis present

## 2019-09-03 DIAGNOSIS — E785 Hyperlipidemia, unspecified: Secondary | ICD-10-CM | POA: Diagnosis present

## 2019-09-03 DIAGNOSIS — J90 Pleural effusion, not elsewhere classified: Secondary | ICD-10-CM | POA: Diagnosis not present

## 2019-09-03 DIAGNOSIS — D631 Anemia in chronic kidney disease: Secondary | ICD-10-CM | POA: Diagnosis present

## 2019-09-03 DIAGNOSIS — E1140 Type 2 diabetes mellitus with diabetic neuropathy, unspecified: Secondary | ICD-10-CM | POA: Diagnosis present

## 2019-09-03 DIAGNOSIS — D638 Anemia in other chronic diseases classified elsewhere: Secondary | ICD-10-CM | POA: Diagnosis not present

## 2019-09-03 DIAGNOSIS — J810 Acute pulmonary edema: Secondary | ICD-10-CM | POA: Diagnosis not present

## 2019-09-03 DIAGNOSIS — Z992 Dependence on renal dialysis: Secondary | ICD-10-CM | POA: Diagnosis not present

## 2019-09-03 DIAGNOSIS — J309 Allergic rhinitis, unspecified: Secondary | ICD-10-CM | POA: Diagnosis present

## 2019-09-03 DIAGNOSIS — R0602 Shortness of breath: Secondary | ICD-10-CM | POA: Diagnosis not present

## 2019-09-03 DIAGNOSIS — E871 Hypo-osmolality and hyponatremia: Secondary | ICD-10-CM | POA: Diagnosis present

## 2019-09-03 DIAGNOSIS — I5043 Acute on chronic combined systolic (congestive) and diastolic (congestive) heart failure: Secondary | ICD-10-CM | POA: Diagnosis present

## 2019-09-03 DIAGNOSIS — E1151 Type 2 diabetes mellitus with diabetic peripheral angiopathy without gangrene: Secondary | ICD-10-CM | POA: Diagnosis present

## 2019-09-03 DIAGNOSIS — E1165 Type 2 diabetes mellitus with hyperglycemia: Secondary | ICD-10-CM | POA: Diagnosis present

## 2019-09-03 DIAGNOSIS — M1990 Unspecified osteoarthritis, unspecified site: Secondary | ICD-10-CM | POA: Diagnosis present

## 2019-09-03 DIAGNOSIS — I5032 Chronic diastolic (congestive) heart failure: Secondary | ICD-10-CM

## 2019-09-03 DIAGNOSIS — I739 Peripheral vascular disease, unspecified: Secondary | ICD-10-CM | POA: Diagnosis not present

## 2019-09-03 DIAGNOSIS — R0600 Dyspnea, unspecified: Secondary | ICD-10-CM | POA: Diagnosis not present

## 2019-09-03 DIAGNOSIS — I2510 Atherosclerotic heart disease of native coronary artery without angina pectoris: Secondary | ICD-10-CM | POA: Diagnosis present

## 2019-09-03 DIAGNOSIS — E1129 Type 2 diabetes mellitus with other diabetic kidney complication: Secondary | ICD-10-CM | POA: Diagnosis not present

## 2019-09-03 LAB — ECHOCARDIOGRAM COMPLETE
AR max vel: 2.45 cm2
AV Area VTI: 2.39 cm2
AV Area mean vel: 2.22 cm2
AV Mean grad: 2.5 mmHg
AV Peak grad: 4.8 mmHg
Ao pk vel: 1.09 m/s
Area-P 1/2: 4.41 cm2
Height: 70 in
S' Lateral: 4.07 cm
Weight: 3408 oz

## 2019-09-03 LAB — BASIC METABOLIC PANEL
Anion gap: 14 (ref 5–15)
BUN: 60 mg/dL — ABNORMAL HIGH (ref 6–20)
CO2: 24 mmol/L (ref 22–32)
Calcium: 8.6 mg/dL — ABNORMAL LOW (ref 8.9–10.3)
Chloride: 91 mmol/L — ABNORMAL LOW (ref 98–111)
Creatinine, Ser: 12.95 mg/dL — ABNORMAL HIGH (ref 0.61–1.24)
GFR calc Af Amer: 4 mL/min — ABNORMAL LOW (ref >60)
GFR calc non Af Amer: 4 mL/min — ABNORMAL LOW (ref >60)
Glucose, Bld: 275 mg/dL — ABNORMAL HIGH (ref 70–99)
Potassium: 4.6 mmol/L (ref 3.5–5.1)
Sodium: 129 mmol/L — ABNORMAL LOW (ref 135–145)

## 2019-09-03 LAB — CBC WITH DIFFERENTIAL/PLATELET
Abs Immature Granulocytes: 0.02 10*3/uL (ref 0.00–0.07)
Basophils Absolute: 0.1 10*3/uL (ref 0.0–0.1)
Basophils Relative: 1 %
Eosinophils Absolute: 0.3 10*3/uL (ref 0.0–0.5)
Eosinophils Relative: 3 %
HCT: 29.4 % — ABNORMAL LOW (ref 39.0–52.0)
Hemoglobin: 9.1 g/dL — ABNORMAL LOW (ref 13.0–17.0)
Immature Granulocytes: 0 %
Lymphocytes Relative: 9 %
Lymphs Abs: 0.8 10*3/uL (ref 0.7–4.0)
MCH: 27.4 pg (ref 26.0–34.0)
MCHC: 31 g/dL (ref 30.0–36.0)
MCV: 88.6 fL (ref 80.0–100.0)
Monocytes Absolute: 0.6 10*3/uL (ref 0.1–1.0)
Monocytes Relative: 6 %
Neutro Abs: 7.4 10*3/uL (ref 1.7–7.7)
Neutrophils Relative %: 81 %
Platelets: 207 10*3/uL (ref 150–400)
RBC: 3.32 MIL/uL — ABNORMAL LOW (ref 4.22–5.81)
RDW: 14.8 % (ref 11.5–15.5)
WBC: 9.1 10*3/uL (ref 4.0–10.5)
nRBC: 0 % (ref 0.0–0.2)

## 2019-09-03 LAB — MRSA PCR SCREENING: MRSA by PCR: NEGATIVE

## 2019-09-03 LAB — GLUCOSE, CAPILLARY
Glucose-Capillary: 331 mg/dL — ABNORMAL HIGH (ref 70–99)
Glucose-Capillary: 194 mg/dL — ABNORMAL HIGH (ref 70–99)
Glucose-Capillary: 234 mg/dL — ABNORMAL HIGH (ref 70–99)

## 2019-09-03 LAB — CBG MONITORING, ED
Glucose-Capillary: 265 mg/dL — ABNORMAL HIGH (ref 70–99)
Glucose-Capillary: 321 mg/dL — ABNORMAL HIGH (ref 70–99)

## 2019-09-03 LAB — BRAIN NATRIURETIC PEPTIDE: B Natriuretic Peptide: 1193 pg/mL — ABNORMAL HIGH (ref 0.0–100.0)

## 2019-09-03 LAB — SARS CORONAVIRUS 2 BY RT PCR (HOSPITAL ORDER, PERFORMED IN ~~LOC~~ HOSPITAL LAB): SARS Coronavirus 2: NEGATIVE

## 2019-09-03 MED ORDER — FUROSEMIDE 10 MG/ML IJ SOLN
40.0000 mg | Freq: Once | INTRAMUSCULAR | Status: AC
  Administered 2019-09-03: 40 mg via INTRAVENOUS
  Filled 2019-09-03: qty 4

## 2019-09-03 MED ORDER — DM-GUAIFENESIN ER 30-600 MG PO TB12: 1.0000 | ORAL_TABLET | Freq: Two times a day (BID) | ORAL | Status: DC | PRN

## 2019-09-03 MED ORDER — ALTEPLASE 2 MG IJ SOLR
2.0000 mg | Freq: Once | INTRAMUSCULAR | Status: DC | PRN
  Filled 2019-09-03: qty 2

## 2019-09-03 MED ORDER — OXYCODONE-ACETAMINOPHEN 5-325 MG PO TABS
1.0000 | ORAL_TABLET | ORAL | Status: AC | PRN
  Administered 2019-09-03 – 2019-09-04 (×2): 1 via ORAL
  Filled 2019-09-03 (×2): qty 1

## 2019-09-03 MED ORDER — MUSCLE RUB 10-15 % EX CREA
1.0000 "application " | TOPICAL_CREAM | CUTANEOUS | Status: DC | PRN
  Filled 2019-09-03: qty 85

## 2019-09-03 MED ORDER — ALBUTEROL SULFATE (2.5 MG/3ML) 0.083% IN NEBU: 5.0000 mg | INHALATION_SOLUTION | Freq: Once | RESPIRATORY_TRACT | Status: DC

## 2019-09-03 MED ORDER — HEPARIN SODIUM (PORCINE) 1000 UNIT/ML DIALYSIS
3800.0000 [IU] | INTRAMUSCULAR | Status: DC | PRN
  Filled 2019-09-03: qty 4

## 2019-09-03 MED ORDER — HEPARIN SODIUM (PORCINE) 1000 UNIT/ML DIALYSIS
20.0000 [IU]/kg | INTRAMUSCULAR | Status: DC | PRN
  Filled 2019-09-03: qty 2

## 2019-09-03 MED ORDER — IPRATROPIUM BROMIDE 0.02 % IN SOLN: 0.5000 mg | Freq: Once | RESPIRATORY_TRACT | Status: DC

## 2019-09-03 MED ORDER — SODIUM CHLORIDE 0.9% FLUSH: 3.0000 mL | INTRAVENOUS | Status: DC | PRN

## 2019-09-03 MED ORDER — CARVEDILOL 12.5 MG PO TABS
12.5000 mg | ORAL_TABLET | Freq: Once | ORAL | Status: AC
  Administered 2019-09-03: 12.5 mg via ORAL
  Filled 2019-09-03: qty 1

## 2019-09-03 MED ORDER — NITROGLYCERIN IN D5W 200-5 MCG/ML-% IV SOLN
5.0000 ug/min | INTRAVENOUS | Status: DC
  Administered 2019-09-03: 5 ug/min via INTRAVENOUS
  Filled 2019-09-03: qty 250

## 2019-09-03 MED ORDER — SODIUM CHLORIDE 0.9 % IV SOLN: 250.0000 mL | INTRAVENOUS | Status: DC | PRN

## 2019-09-03 MED ORDER — ALBUTEROL (5 MG/ML) CONTINUOUS INHALATION SOLN
10.0000 mg/h | INHALATION_SOLUTION | Freq: Once | RESPIRATORY_TRACT | Status: AC
  Administered 2019-09-03: 10 mg/h via RESPIRATORY_TRACT
  Filled 2019-09-03: qty 20

## 2019-09-03 MED ORDER — PERFLUTREN LIPID MICROSPHERE
1.0000 mL | INTRAVENOUS | Status: AC | PRN
  Administered 2019-09-03: 2 mL via INTRAVENOUS
  Filled 2019-09-03: qty 10

## 2019-09-03 MED ORDER — SODIUM CHLORIDE 0.9 % IV SOLN: 100.0000 mL | INTRAVENOUS | Status: DC | PRN

## 2019-09-03 MED ORDER — HEPARIN SODIUM (PORCINE) 1000 UNIT/ML DIALYSIS: 1000.0000 [IU] | INTRAMUSCULAR | Status: DC | PRN

## 2019-09-03 MED ORDER — ACETAMINOPHEN 325 MG PO TABS
650.0000 mg | ORAL_TABLET | ORAL | Status: DC | PRN
  Administered 2019-09-03 (×2): 650 mg via ORAL
  Filled 2019-09-03 (×2): qty 2

## 2019-09-03 MED ORDER — POLYVINYL ALCOHOL 1.4 % OP SOLN
1.0000 [drp] | OPHTHALMIC | Status: DC | PRN
  Filled 2019-09-03: qty 15

## 2019-09-03 MED ORDER — HEPARIN SODIUM (PORCINE) 5000 UNIT/ML IJ SOLN
5000.0000 [IU] | Freq: Three times a day (TID) | INTRAMUSCULAR | Status: DC
  Administered 2019-09-03 – 2019-09-04 (×3): 5000 [IU] via SUBCUTANEOUS
  Filled 2019-09-03 (×4): qty 1

## 2019-09-03 MED ORDER — FENOFIBRATE 160 MG PO TABS
160.0000 mg | ORAL_TABLET | Freq: Every day | ORAL | Status: DC
  Administered 2019-09-03 – 2019-09-04 (×2): 160 mg via ORAL
  Filled 2019-09-03 (×4): qty 1

## 2019-09-03 MED ORDER — INSULIN GLARGINE 100 UNIT/ML ~~LOC~~ SOLN
50.0000 [IU] | Freq: Every day | SUBCUTANEOUS | Status: DC
  Administered 2019-09-03: 50 [IU] via SUBCUTANEOUS
  Filled 2019-09-03 (×2): qty 0.5

## 2019-09-03 MED ORDER — ASPIRIN EC 81 MG PO TBEC
81.0000 mg | DELAYED_RELEASE_TABLET | Freq: Every day | ORAL | Status: DC
  Administered 2019-09-03 – 2019-09-04 (×2): 81 mg via ORAL
  Filled 2019-09-03 (×2): qty 1

## 2019-09-03 MED ORDER — ALUM & MAG HYDROXIDE-SIMETH 200-200-20 MG/5ML PO SUSP: 30.0000 mL | ORAL | Status: DC | PRN

## 2019-09-03 MED ORDER — CHLORHEXIDINE GLUCONATE CLOTH 2 % EX PADS
6.0000 | MEDICATED_PAD | Freq: Every day | CUTANEOUS | Status: DC
  Administered 2019-09-03 – 2019-09-04 (×2): 6 via TOPICAL

## 2019-09-03 MED ORDER — PHENOL 1.4 % MT LIQD
1.0000 | OROMUCOSAL | Status: DC | PRN
  Filled 2019-09-03: qty 177

## 2019-09-03 MED ORDER — CARVEDILOL 12.5 MG PO TABS
12.5000 mg | ORAL_TABLET | Freq: Two times a day (BID) | ORAL | Status: DC
  Administered 2019-09-04: 12.5 mg via ORAL
  Filled 2019-09-03 (×2): qty 1

## 2019-09-03 MED ORDER — LIP MEDEX EX OINT
1.0000 "application " | TOPICAL_OINTMENT | CUTANEOUS | Status: DC | PRN
  Filled 2019-09-03: qty 7

## 2019-09-03 MED ORDER — IPRATROPIUM-ALBUTEROL 0.5-2.5 (3) MG/3ML IN SOLN
3.0000 mL | Freq: Once | RESPIRATORY_TRACT | Status: AC
  Administered 2019-09-03: 3 mL via RESPIRATORY_TRACT
  Filled 2019-09-03: qty 3

## 2019-09-03 MED ORDER — POLYETHYLENE GLYCOL 3350 17 G PO PACK: 17.0000 g | PACK | Freq: Every day | ORAL | Status: DC | PRN

## 2019-09-03 MED ORDER — ALBUTEROL SULFATE HFA 108 (90 BASE) MCG/ACT IN AERS: 2.0000 | INHALATION_SPRAY | RESPIRATORY_TRACT | Status: DC | PRN

## 2019-09-03 MED ORDER — ALBUTEROL SULFATE (2.5 MG/3ML) 0.083% IN NEBU: 2.5000 mg | INHALATION_SOLUTION | RESPIRATORY_TRACT | Status: DC | PRN

## 2019-09-03 MED ORDER — HYDROCORTISONE 1 % EX CREA
1.0000 "application " | TOPICAL_CREAM | Freq: Three times a day (TID) | CUTANEOUS | Status: DC | PRN
  Filled 2019-09-03: qty 28

## 2019-09-03 MED ORDER — ONDANSETRON HCL 4 MG/2ML IJ SOLN: 4.0000 mg | Freq: Four times a day (QID) | INTRAMUSCULAR | Status: DC | PRN

## 2019-09-03 MED ORDER — ATORVASTATIN CALCIUM 40 MG PO TABS
80.0000 mg | ORAL_TABLET | Freq: Every day | ORAL | Status: DC
  Administered 2019-09-03: 80 mg via ORAL
  Filled 2019-09-03: qty 2

## 2019-09-03 MED ORDER — INSULIN ASPART 100 UNIT/ML ~~LOC~~ SOLN
0.0000 [IU] | Freq: Three times a day (TID) | SUBCUTANEOUS | Status: DC
  Administered 2019-09-03: 3 [IU] via SUBCUTANEOUS
  Administered 2019-09-03: 8 [IU] via SUBCUTANEOUS
  Administered 2019-09-03: 11 [IU] via SUBCUTANEOUS
  Administered 2019-09-04: 2 [IU] via SUBCUTANEOUS
  Filled 2019-09-03: qty 1

## 2019-09-03 MED ORDER — LORATADINE 10 MG PO TABS: 10.0000 mg | ORAL_TABLET | Freq: Every day | ORAL | Status: DC | PRN

## 2019-09-03 MED ORDER — AZELASTINE HCL 0.1 % NA SOLN: 2.0000 | Freq: Three times a day (TID) | NASAL | Status: DC | PRN

## 2019-09-03 MED ORDER — LORATADINE 10 MG PO TABS
10.0000 mg | ORAL_TABLET | Freq: Every day | ORAL | Status: DC
  Administered 2019-09-03 – 2019-09-04 (×2): 10 mg via ORAL
  Filled 2019-09-03 (×2): qty 1

## 2019-09-03 MED ORDER — SENNOSIDES-DOCUSATE SODIUM 8.6-50 MG PO TABS: 2.0000 | ORAL_TABLET | Freq: Every evening | ORAL | Status: DC | PRN

## 2019-09-03 MED ORDER — RENA-VITE PO TABS
1.0000 | ORAL_TABLET | Freq: Every day | ORAL | Status: DC
  Administered 2019-09-03: 1 via ORAL
  Filled 2019-09-03 (×2): qty 1

## 2019-09-03 MED ORDER — RENA-VITE PO TABS
1.0000 | ORAL_TABLET | Freq: Every day | ORAL | Status: DC
  Filled 2019-09-03 (×2): qty 1

## 2019-09-03 MED ORDER — GABAPENTIN 300 MG PO CAPS
600.0000 mg | ORAL_CAPSULE | Freq: Two times a day (BID) | ORAL | Status: DC
  Administered 2019-09-03 – 2019-09-04 (×3): 600 mg via ORAL
  Filled 2019-09-03 (×3): qty 2

## 2019-09-03 MED ORDER — HYDROCORTISONE (PERIANAL) 2.5 % EX CREA
1.0000 "application " | TOPICAL_CREAM | Freq: Four times a day (QID) | CUTANEOUS | Status: DC | PRN
  Filled 2019-09-03: qty 28.35

## 2019-09-03 MED ORDER — SALINE SPRAY 0.65 % NA SOLN
1.0000 | NASAL | Status: DC | PRN
  Filled 2019-09-03: qty 44

## 2019-09-03 MED ORDER — INSULIN GLARGINE 100 UNIT/ML ~~LOC~~ SOLN
70.0000 [IU] | Freq: Every day | SUBCUTANEOUS | Status: DC
  Filled 2019-09-03: qty 0.7

## 2019-09-03 MED ORDER — ALBUTEROL SULFATE (2.5 MG/3ML) 0.083% IN NEBU
5.0000 mg | INHALATION_SOLUTION | Freq: Once | RESPIRATORY_TRACT | Status: AC
  Administered 2019-09-03: 5 mg via RESPIRATORY_TRACT
  Filled 2019-09-03: qty 6

## 2019-09-03 MED ORDER — ALBUTEROL SULFATE (2.5 MG/3ML) 0.083% IN NEBU
2.5000 mg | INHALATION_SOLUTION | Freq: Once | RESPIRATORY_TRACT | Status: AC
  Administered 2019-09-03: 2.5 mg via RESPIRATORY_TRACT
  Filled 2019-09-03: qty 3

## 2019-09-03 MED ORDER — PREDNISONE 50 MG PO TABS
60.0000 mg | ORAL_TABLET | Freq: Once | ORAL | Status: AC
  Administered 2019-09-03: 60 mg via ORAL
  Filled 2019-09-03: qty 1

## 2019-09-03 MED ORDER — SODIUM CHLORIDE 0.9% FLUSH
3.0000 mL | Freq: Two times a day (BID) | INTRAVENOUS | Status: DC
  Administered 2019-09-03 – 2019-09-04 (×2): 3 mL via INTRAVENOUS

## 2019-09-03 NOTE — Progress Notes (Signed)
Inpatient Diabetes Program Recommendations  AACE/ADA: New Consensus Statement on Inpatient Glycemic Control (2015)  Target Ranges:  Prepandial:   less than 140 mg/dL      Peak postprandial:   less than 180 mg/dL (1-2 hours)      Critically ill patients:  140 - 180 mg/dL   Lab Results  Component Value Date   GLUCAP 194 (H) 09/03/2019   HGBA1C 8.1 (H) 07/10/2019    Review of Glycemic Control  Diabetes history: DM2 Outpatient Diabetes medications: Basaglar 70 units QHS, Humalog 10 units tidwc Current orders for Inpatient glycemic control: Lantus 50 units QHS, Novolog 0-15 units tidwc  HgbA1C - 8.1%  Inpatient Diabetes Program Recommendations:     Decrease Novolog to 0-9 units tidwc Add Novolog 3 units tidwc for meal coverage insulin.  Will continue to follow.  Thank you. Lorenda Peck, RD, LDN, CDE Inpatient Diabetes Coordinator [phone]

## 2019-09-03 NOTE — ED Notes (Signed)
EDP made aware of pt sbp >200 repeatedly after several cycles.

## 2019-09-03 NOTE — ED Provider Notes (Signed)
[facility] EMERGENCY DEPARTMENT Provider Note   CSN: [id number] Arrival date & time: 09/02/19  2316     History Chief Complaint  Patient presents with  . Shortness of Breath    Anthony Bullock is a 59 y.o. male.  The history is provided by the patient.  Shortness of Breath Severity:  Moderate Onset quality:  Sudden Timing:  Constant Progression:  Improving Chronicity:  New Relieved by:  Oxygen Worsened by:  Nothing Associated symptoms: cough   Associated symptoms: no chest pain, no fever, no hemoptysis, no syncope and no vomiting   Patient with extensive medical history including CHF, ESRD, CAD, ICD in place, diabetes presents with shortness of breath Patient reports that at approximately 9 PM on August 23 he began having cough and shortness of breath.  No hemoptysis.  No chest pain.  EMS was called he was placed on oxygen he is now feeling improved.  He is not on home oxygen.  He is a former smoker.  Reports history of COPD.  He had otherwise been at his baseline.  No missed dialysis sessions Patient did have recent left toe amputation earlier this month.  He reports no issues at this time.  No fevers. He denies history of PE    Past Medical History:  Diagnosis Date  . 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 Allergic rhinitis   . Anemia   . Arthritis   . Chronic systolic heart failure ([place])    a. ECHO (12/2012) EF 25-30%, HK entireanteroseptal myocardium //  b.  EF 25%, diffuse HK, grade 1 diastolic dysfunction, MAC, mild LAE, normal RVSF, trivial pericardial effusion  . COPD (chronic obstructive pulmonary disease) ([place])   . Diabetes mellitus type II   . Diabetic nephropathy ([place])   . Diabetic neuropathy ([place])   . ESRD on hemodialysis [facility])    started HD June 2017, goes to [facility] HD unit, Dr Hinda Lenis  . History of cardiac catheterization    a.Myoview 1/15:  There is significant left ventricular dysfunction. There may be slight  scar at the apex. There is no significant ischemia. LV Ejection Fraction: 27%  //  b. RHC/LHC (1/15) with mean RA 6, PA 47/22 mean 33, mean PCWP 20, PVR 2.5 WU, CI 2.5; 80% dLAD stenosis, 70% diffuse large D.   . History of kidney stones   . Hyperlipidemia   . Hypertension   . Kidney stones   . NICM (nonischemic cardiomyopathy) ([place])    Primarily nonischemic. Echo (12/14) with EF 25-30%. Echo (3/15) with EF 25%, mild to moderately dilated LV, normal RV size and systolic function.   . Osteomyelitis ([place])    left fifth ray  . Pneumonia   . Urethral stricture   . Wears glasses     Patient Active Problem List   Diagnosis Date Noted  . Osteomyelitis of fifth toe of left foot ([place])   . Febrile illness 08/17/2019  . SIRS (systemic inflammatory response syndrome) ([place]) 08/17/2019  . Thrombocytopenia ([place]) 08/17/2019  . Pressure injury of skin 08/17/2019  . Hypertensive crisis 07/10/2019  . Onychomycosis 08/16/2016  . Status post unilateral below knee amputation, right ([place]) 04/26/2016  . Bilateral carotid bruits 03/30/2016  . Diabetes mellitus with complication ([place])   . Anemia due to chronic kidney disease   . ESRD (end stage renal disease) on dialysis ([place]) 11/30/2015  . Acute respiratory failure with hypoxia ([place]) 11/29/2015  . At high risk for falls 09/29/2014  .  Peripheral vascular disease ([place]) 09/29/2014  . Osteoarthritis of both knees 01/07/2014  . Osteoarthritis of multiple joints 10/06/2013  . NICM (nonischemic cardiomyopathy) ([place]) 06/27/2013  . CAD (coronary artery disease) 03/13/2013  . Abnormal nuclear stress test 01/27/2013  . Chronic systolic congestive heart failure ([place]) 01/27/2013  . COPD (chronic obstructive pulmonary disease) ([place]) 12/27/2012  . Diabetic peripheral neuropathy ([place]) 02/10/2012  . URINARY CALCULUS 09/28/2009  . Adjustment disorder with depressed mood 08/21/2009  . Poorly controlled type II diabetes mellitus with renal complication ([place])  50/35/4656  . Hyperlipidemia 04/14/2008  . Essential hypertension 04/14/2008  . ALLERGIC RHINITIS 04/14/2008    Past Surgical History:  Procedure Laterality Date  . ABDOMINAL AORTOGRAM W/LOWER EXTREMITY N/A 03/30/2016   Procedure: Abdominal Aortogram w/Lower Extremity;  Surgeon: Angelia Mould, MD;  Location: [place] CV LAB;  Service: Cardiovascular;  Laterality: N/A;  . AMPUTATION Right 04/26/2016   Procedure: Right Below Knee Amputation;  Surgeon: Newt Minion, MD;  Location: [place];  Service: Orthopedics;  Laterality: Right;  . AMPUTATION Left 08/21/2019   Procedure: LEFT FOOT 5TH RAY AMPUTATION;  Surgeon: Newt Minion, MD;  Location: [place];  Service: Orthopedics;  Laterality: Left;  . AV FISTULA PLACEMENT Right 09/08/2015   Procedure: INSERTION OF 4-83m x 45cm  ARTERIOVENOUS (AV) GORE-TEX GRAFT RIGHT UPPER  ARM;  Surgeon: CAngelia Mould MD;  [place]  Service: Vascular;  Laterality: Right;  . AV FISTULA PLACEMENT Left 01/14/2016   Procedure: CREATION OF LEFT UPPER ARM ARTERIOVENOUS FISTULA;  Surgeon: CAngelia Mould MD;  [place]  Service: Vascular;  Laterality: Left;  . BASCILIC VEIN TRANSPOSITION Right 08/22/2014   Procedure: RIGHT UPPER ARM BASCILIC VEIN TRANSPOSITION;  Surgeon: CAngelia Mould MD;  [place]  Service: Vascular;  Laterality: Right;  . BELOW KNEE LEG AMPUTATION Right 04/26/2016  . CARDIAC CATHETERIZATION    . CARDIAC DEFIBRILLATOR PLACEMENT  06/27/2013   Sub Q       BY DR KCaryl Comes . CATARACT EXTRACTION W/PHACO Right 08/06/2018   Procedure: CATARACT EXTRACTION PHACO AND INTRAOCULAR LENS PLACEMENT (IOC);  Surgeon: WBaruch Goldmann MD;  Location: [facility];  Service: Ophthalmology;  Laterality: Right;  CDE: 4.06  . CATARACT EXTRACTION W/PHACO Left 08/20/2018   Procedure: CATARACT EXTRACTION PHACO AND INTRAOCULAR LENS PLACEMENT (IOC);  Surgeon: WBaruch Goldmann MD;  Location: [facility];  Service: Ophthalmology;  Laterality: Left;   CDE: 6.76  . COLONOSCOPY WITH PROPOFOL N/A 07/22/2015   Procedure: COLONOSCOPY WITH PROPOFOL;  Surgeon: HDoran Stabler MD;  Location: [facility];  Service: Gastroenterology;  Laterality: N/A;  . FEMORAL-POPLITEAL BYPASS GRAFT Right 03/31/2016   Procedure: BYPASS GRAFT FEMORAL-POPLITEAL ARTERY USING RIGHT GREATER SAPHENOUS NONREVERSED VEIN;  Surgeon: CAngelia Mould MD;  [place]  Service: Vascular;  Laterality: Right;  . HERNIA REPAIR    . I & D EXTREMITY Right 03/31/2016   Procedure: IRRIGATION AND DEBRIDEMENT FOOT;  Surgeon: CAngelia Mould MD;  Location: [place]  Service: Vascular;  Laterality: Right;  . IMPLANTABLE CARDIOVERTER DEFIBRILLATOR IMPLANT N/A 06/27/2013   Procedure: SUB Q ICD;  Surgeon: SDeboraha Sprang MD;  [facility];  Service: Cardiovascular;  Laterality: N/A;  . INTRAOPERATIVE ARTERIOGRAM Right 03/31/2016   Procedure: INTRA OPERATIVE ARTERIOGRAM;  Surgeon: CAngelia Mould MD;  [place]  Service: Vascular;  Laterality: Right;  . IR GENERIC HISTORICAL Right 11/30/2015   IR THROMBECTOMY AV FISTULA W/THROMBOLYSIS/PTA INC/SHUNT/IMG RIGHT 11/30/2015 GAletta Edouard MD  [facility]  . IR GENERIC HISTORICAL  11/30/2015   IR US GUIDE VASC ACCESS RIGHT 11/30/2015 Aletta Edouard, MD [facility]  . IR GENERIC HISTORICAL Right 12/15/2015   IR THROMBECTOMY AV FISTULA W/THROMBOLYSIS/PTA/STENT INC/SHUNT/IMG RT 12/15/2015 Arne Cleveland, MD [facility]  . IR GENERIC HISTORICAL  12/15/2015   IR US GUIDE VASC ACCESS RIGHT 12/15/2015 Arne Cleveland, MD [facility]  . IR GENERIC HISTORICAL  12/28/2015   IR FLUORO GUIDE CV LINE RIGHT 12/28/2015 Marybelle Killings, MD [facility]  . IR GENERIC HISTORICAL  12/28/2015   IR US GUIDE VASC ACCESS RIGHT 12/28/2015 Marybelle Killings, MD [facility]  . LEFT A ND RIGHT HEART CATH  01/30/2013   DR Sung Amabile  . LEFT AND RIGHT HEART CATHETERIZATION WITH CORONARY ANGIOGRAM N/A 01/30/2013   Procedure: LEFT AND RIGHT  HEART CATHETERIZATION WITH CORONARY ANGIOGRAM;  Surgeon: Jolaine Artist, MD;  Location: [facility] CATH LAB;  Service: Cardiovascular;  Laterality: N/A;  . PERIPHERAL VASCULAR CATHETERIZATION Right 01/26/2015   Procedure: A/V Fistulagram;  Surgeon: Angelia Mould, MD;  Location: [place] CV LAB;  Service: Cardiovascular;  Laterality: Right;  . reapea urethral surgery for recurrent obstruction  2011  . TOTAL KNEE ARTHROPLASTY Right 2007  . VEIN HARVEST Right 03/31/2016   Procedure: RIGHT GREATER SAPHENOUS VEIN HARVEST;  Surgeon: Angelia Mould, MD;  Location: [facility] OR;  Service: Vascular;  Laterality: Right;       Family History  Problem Relation Age of Onset  . Bladder Cancer Mother   . Alcohol abuse Father   . Melanoma Father   . Stroke Maternal Grandmother   . Heart Problems Maternal Grandmother        unknown  . Diabetes Maternal Grandmother   . Heart disease Maternal Grandfather   . Prostate cancer Maternal Grandfather     Social History   Tobacco Use  . Smoking status: Former Smoker    Packs/day: 2.00    Years: 32.00    Pack years: 64.00    Types: Cigarettes    Quit date: 05/11/2010    Years since quitting: 9.3  . Smokeless tobacco: Never Used  Vaping Use  . Vaping Use: Never used  Substance Use Topics  . Alcohol use: No  . Drug use: No    Home Medications Prior to Admission medications   Medication Sig Start Date End Date Taking? Authorizing Provider  albuterol (PROVENTIL HFA;VENTOLIN HFA) 108 (90 Base) MCG/ACT inhaler Inhale 2 puffs into the lungs every 4 (four) hours as needed for wheezing or shortness of breath. 02/16/18   Burchette, Alinda Sierras, MD  aspirin EC 81 MG tablet Take 81 mg by mouth daily.     [provider]  atorvastatin (LIPITOR) 80 MG tablet Take 1 tablet (80 mg total) by mouth at bedtime. 05/08/19   Burchette, Alinda Sierras, MD  azelastine (ASTELIN) 0.1 % nasal spray Place 2 sprays into both nostrils 3 (three) times daily as needed for  rhinitis. Use in each nostril as directed 03/10/14   Burchette, Alinda Sierras, MD  carvedilol (COREG) 12.5 MG tablet Take 1 tablet (12.5 mg total) by mouth 2 (two) times daily with a meal. 05/08/19   Burchette, Alinda Sierras, MD  cefTAZidime (FORTAZ) IVPB Inject 2 g into the vein Every Tuesday,Thursday,and Saturday with dialysis. Indication:  bacteremia Last Day of Therapy:  08/31/19 Labs - Once weekly:  CBC/D and BMP, Labs - Every other week:  ESR and CRP Method of administration: IV Push Method of administration may be  changed at the discretion of home infusion pharmacist based upon assessment of the patient and/or caregiver's ability to self-administer the medication ordered. 08/20/19   Johnson, Clanford L, MD  Continuous Blood Gluc Sensor (FREESTYLE LIBRE 14 DAY SENSOR) MISC USE AS DIRECTED EVERY 14 DAYS 08/02/19   Burchette, Alinda Sierras, MD  fenofibrate 160 MG tablet Take 1 tablet (160 mg total) by mouth daily. 05/08/19   Burchette, Alinda Sierras, MD  fexofenadine (ALLEGRA) 180 MG tablet Take 180 mg by mouth daily.    [provider]  furosemide (LASIX) 40 MG tablet Take 3 tablets by mouth once daily 08/07/18   Burchette, Alinda Sierras, MD  gabapentin (NEURONTIN) 300 MG capsule Take 2 capsules (600 mg total) by mouth 2 (two) times daily. 05/08/19   Burchette, Alinda Sierras, MD  glucose blood test strip Check 1 time daily. E11.9 One Touch Ultra Blue Test Strips 03/10/14   [name], MD  Insulin Glargine [facility]) 100 UNIT/ML INJECT 70 UNITS SUBCUTANEOUSLY AT BEDTIME 05/08/19   Burchette, Alinda Sierras, MD  Insulin Lispro (HUMALOG KWIKPEN) 200 UNIT/ML SOPN Inject 10 Units into the skin 3 (three) times daily with meals. Use tid with meals as per sliding scale 03/16/18   Burchette, Alinda Sierras, MD  Insulin Pen Needle (BD PEN NEEDLE NANO U/F) 32G X 4 MM MISC USE 1 PEN NEEDLE SUBCUTANEOUSLY WITH INSULIN 4 TIMES DAILY 12/21/18   Burchette, Alinda Sierras, MD  lanthanum (FOSRENOL) 1000 MG chewable tablet Chew 1,000 mg by mouth 3 (three)  times daily with meals. Take one tablet after each meal.     [provider]  multivitamin (RENA-VIT) TABS tablet Take 1 tablet by mouth daily. 03/21/19   Burchette, Alinda Sierras, MD  Olopatadine HCl (PATADAY) 0.2 % SOLN Place 1 drop into both eyes daily as needed (for allergies).     [provider]  oxyCODONE-acetaminophen (PERCOCET) 5-325 MG tablet Take 1 tablet by mouth every 4 (four) hours as needed. 08/21/19 08/20/20  Persons, Bevely Palmer, PA  vancomycin IVPB Inject 2,000 mg into the vein Every Tuesday,Thursday,and Saturday with dialysis. Indication:  bacteremia First Dose: No Last Day of Therapy:  08/31/19 Labs - Sunday/Monday:  CBC/D, BMP, and vancomycin trough. Labs - Thursday:  BMP and vancomycin trough Labs - Every other week:  ESR and CRP Method of administration:Elastomeric Method of administration may be changed at the discretion of the patient and/or caregiver's ability to self-administer the medication ordered. 08/20/19   Johnson, Clanford L, MD    Allergies    Epoetin alfa, Ferumoxytol, and Morphine sulfate  Review of Systems   Review of Systems  Constitutional: Negative for fever.  Respiratory: Positive for cough and shortness of breath. Negative for hemoptysis.   Cardiovascular: Negative for chest pain, leg swelling and syncope.  Gastrointestinal: Negative for vomiting.  All other systems reviewed and are negative.   Physical Exam Updated Vital Signs BP (!) 160/61   Pulse 92   Temp 98.2 F (36.8 C) (Oral)   Resp 19   Ht 1.778 m (_0 )   Wt 96.6 kg   SpO2 100%   BMI 30.56 kg/m   Physical Exam CONSTITUTIONAL: Chronically ill appearng HEAD: Normocephalic/atraumatic EYES: EOMI/PERRL ENMT: Mucous membranes moist NECK: supple no meningeal signs SPINE/BACK:entire spine nontender CV: S1/S2 noted, no murmurs/rubs/gallops noted LUNGS: Coarse breath sounds bilaterally, no apparent distress ABDOMEN: soft, nontender, no rebound or guarding, bowel sounds  noted throughout abdomen GU:no cva tenderness NEURO: Pt is awake/alert/appropriate, moves all extremitiesx4.  No  facial droop.   EXTREMITIES: pulses normal/equal, full ROM, postop shoe left foot.  Right BKA noted with prosthesis in place SKIN: warm, color normal PSYCH: no abnormalities of mood noted, alert and oriented to situation  ED Results / Procedures / Treatments   Labs (all labs ordered are listed, but only abnormal results are displayed) Labs Reviewed  BASIC METABOLIC PANEL - Abnormal; Notable for the following components:      Result Value   Sodium 129 (*)    Chloride 91 (*)    Glucose, Bld 275 (*)    BUN 60 (*)    Creatinine, Ser 12.95 (*)    Calcium 8.6 (*)    GFR calc non Af Amer 4 (*)    GFR calc Af Amer 4 (*)    All other components within normal limits  CBC WITH DIFFERENTIAL/PLATELET - Abnormal; Notable for the following components:   RBC 3.32 (*)    Hemoglobin 9.1 (*)    HCT 29.4 (*)    All other components within normal limits  BRAIN NATRIURETIC PEPTIDE - Abnormal; Notable for the following components:   B Natriuretic Peptide 1,193.0 (*)    All other components within normal limits  SARS CORONAVIRUS 2 BY RT PCR [facility] ORDER, [place] LAB)    EKG EKG Interpretation  Date/Time:  Monday September 02 2019 23:21:24 EDT Ventricular Rate:  103 PR Interval:    QRS Duration: 105 QT Interval:  396 QTC Calculation: 519 R Axis:   0 Text Interpretation: Sinus tachycardia Nonspecific T abnormalities, lateral leads Prolonged QT interval Confirmed by Ripley Fraise [phone]) on 09/02/2019 11:24:39 PM   Radiology DG Chest Port 1 View  Result Date: 09/03/2019 CLINICAL DATA:  Increased shortness of breath EXAM: PORTABLE CHEST 1 VIEW COMPARISON:  Earlier today FINDINGS: Cardiomegaly with Kerley lines and cephalized blood flow. Fissural thickening and small effusions. Dialysis catheter on the right and direct defibrillator lead on the left.  IMPRESSION: CHF. Electronically Signed   By: Monte Fantasia M.D.   On: 09/03/2019 04:22   DG Chest Port 1 View  Result Date: 09/03/2019 CLINICAL DATA:  Dyspnea EXAM: PORTABLE CHEST 1 VIEW COMPARISON:  08/30/2019 FINDINGS: Lungs are clear. No pneumothorax or pleural effusion. Right internal jugular hemodialysis catheter with its tip at the superior cavoatrial junction and epicardial defibrillator are unchanged. Mild cardiomegaly is stable. Pulmonary vascularity is normal. No acute bone abnormality. IMPRESSION: No acute cardiopulmonary process. Electronically Signed   By: Fidela Salisbury MD   On: 09/03/2019 00:23    Procedures .Critical Care Performed by: Ripley Fraise, MD Authorized by: Ripley Fraise, MD   Critical care provider statement:    Critical care time (minutes):  45   Critical care start time:  09/03/2019 4:30 AM   Critical care end time:  09/03/2019 5:15 AM   Critical care time was exclusive of:  Separately billable procedures and treating other patients   Critical care was necessary to treat or prevent imminent or life-threatening deterioration of the following conditions:  Respiratory failure and renal failure   Critical care was time spent personally by me on the following activities:  Development of treatment plan with patient or surrogate, discussions with consultants, evaluation of patient's response to treatment, examination of patient, review of old charts, re-evaluation of patient's condition, ordering and review of radiographic studies, ordering and review of laboratory studies, ordering and performing treatments and interventions and pulse oximetry   I assumed direction of critical care for this patient from  another provider in my specialty: no       Medications Ordered in ED Medications  nitroGLYCERIN 50 mg in dextrose 5 % 250 mL (0.2 mg/mL) infusion (40 mcg/min Intravenous Rate/Dose Change 09/03/19 0511)  ipratropium-albuterol (DUONEB) 0.5-2.5 (3) MG/3ML nebulizer  solution 3 mL (3 mLs Nebulization Given 09/03/19 0138)  albuterol (PROVENTIL) (2.5 MG/3ML) 0.083% nebulizer solution 2.5 mg (2.5 mg Nebulization Given 09/03/19 0138)  predniSONE (DELTASONE) tablet 60 mg (60 mg Oral Given 09/03/19 0309)  albuterol (PROVENTIL) (2.5 MG/3ML) 0.083% nebulizer solution 5 mg (5 mg Nebulization Given 09/03/19 0319)  carvedilol (COREG) tablet 12.5 mg (12.5 mg Oral Given 09/03/19 0317)  albuterol (PROVENTIL,VENTOLIN) solution continuous neb (10 mg/hr Nebulization Given 09/03/19 0417)  furosemide (LASIX) injection 40 mg (40 mg Intravenous Given 09/03/19 0442)    ED Course  I have reviewed the triage vital signs and the nursing notes.  Pertinent labs & imaging results that were available during my care of the patient were reviewed by me and considered in my medical decision making (see chart for details).    MDM Rules/Calculators/A&P                         1:20 AM Patient with extensive medical history presents with shortness of breath.  He is now feeling improved.  No distress.  On room air, pulse ox is over 95%. Chest x-ray was reviewed and is negative. He does have some coarse breath sounds with a previous history of COPD.  We will give nebulized therapy and reassess.  He is Covid negative Low suspicion for PE at this time 3:12 AM Patient now has increased wheezing bilaterally.  His pulse ox has dropped into the low 90s.  He does appear more tachypneic.  He will be given another dose of albuterol and steroids.  He also missed evening dose of blood pressure medicine and is requesting 1 dose. 3:50 AM Patient reports he feels improved when he has a neb treatment then he gets worse.  He is tachypneic and tachycardic.  He is still hypertensive.  He has very coarse breath sounds bilaterally.  Patient's course has worsened since arrival.  I am concerned that he may be developing flash pulmonary edema, will proceed with repeat chest x-ray 4:31 AM Repeat chest x-ray now reveals  acute pulmonary edema.  Continuous nebulized treatment was stopped. Patient be given IV Lasix, nitroglycerin.  Patient reports he takes Lasix on dialysis days At this time, patient does not require noninvasive ventilation He would need to be admitted.  We will consult nephrology 5:17 AM Discussed with nephrology Dr. Johnney Ou Patient will be given dialysis in the next few hours. Patient was placed on nitroglycerin and Lasix.  Patient's work of breathing has improved Discussed with hospitalist Dr. Josph Macho for admission   This patient presents to the ED for concern of shortness of breath, this involves an extensive number of treatment options, and is a complaint that carries with it a high risk of complications and morbidity.  The differential diagnosis includes COPD exacerbation, CHF, pulmonary embolism, pneumonia, COVID-19   Lab Tests:   I Ordered, reviewed, and interpreted labs, which included electrolytes, BNP, complete blood count  Medicines ordered:   I ordered medication albuterol and Atrovent, nitroglycerin and Lasix for shortness of breath  Imaging Studies ordered:   I ordered imaging studies which included chest x-ray   I independently visualized and interpreted imaging which showed no acute findings  Additional history obtained:  Previous records obtained and reviewed   Consultations Obtained:   I consulted nephrology and [facility] hospitalist and discussed lab and imaging findings  Reevaluation:  After the interventions stated above, I reevaluated the patient and found patient stabilized  Critical Interventions:  . Nitroglycerin and Lasix    Final Clinical Impression(s) / ED Diagnoses Final diagnoses:  Acute pulmonary edema [facility])  Hypertensive emergency    Rx / DC Orders ED Discharge Orders    None       Ripley Fraise, MD 09/03/19 [phone]

## 2019-09-03 NOTE — ED Notes (Signed)
Pt refusing Coreg and Heparin subq injection this morning. RN asked pt why he didn't want the medications and he would only state, "I just don't want them". RN gave education of importance of medications, but pt continued to refuse. Pt c/o new bilateral arm pain, but refused Tylenol at this time. Pt went back to sleep upon RN leaving room.

## 2019-09-03 NOTE — Progress Notes (Addendum)
*  PRELIMINARY RESULTS* Echocardiogram 2D Echocardiogram has been performed with Definity.  Samuel Germany 09/03/2019, 10:39 AM

## 2019-09-03 NOTE — Progress Notes (Signed)
59 year old with history of osteomyelitis, nonischemic cardiomyopathy, HTN, HLD, ESRD on HD, diabetic nephropathy, DM2, COPD, AICD presents to the ER with shortness of breath. In the ED he had signs of volume overload, hypoxia, tachycardia, elevated blood pressure and creatinine. Nephrology was consulted for hemodialysis.  Recently patient was admitted for fevers without obvious known source. Nephrology did not feel his HD catheter was infected and was discharged on 2 g of Fortaz and vancomycin during his hemodialysis until cultures remain negative.  Seen and examined in the ER, patient does not have any complaints.  His blood pressure was ranging with systolic around 786V while on nitroglycerin drip.  Only complaint at home was shortness of breath which has now improved  Patient is afebrile, blood pressure improved on nitroglycerin drip. Still on 5 L nasal cannula.  Not in acute distress, some bibasilar crackles.  Abdomen is nontender nondistended   Assessment and plan #1 acute hypoxic respiratory failure secondary to volume overload #2 hypertensive urgency #3 congestive heart failure with preserved ejection fraction EF 67%, grade 1 diastolic dysfunction, class IV #4 ESRD on hemodialysis TTS #5 diabetes mellitus type 2 #6 peripheral vascular disease status post right-sided BKA  -Nephrology has been consulted for hemodialysis. This should help his blood pressure and volume status. In the meantime maintain nitroglycerin drip to help his blood pressure. Home medications have been restarted. On renal diet. -Currently will monitor him off vancomycin and Fortaz. Previous cultures reviewed they are negative.  Nephrology point planning on drawing cultures from his HD site today. -Lantus reduced to 50 units daily, insulin sliding scale   Time spent-20 minutes Gerlean Ren MD [facility]

## 2019-09-03 NOTE — Consult Note (Signed)
[facility] Renal Consultation Note    Indication for Consultation:  Management of ESRD/hemodialysis; anemia, hypertension/volume and secondary hyperparathyroidism  HPI: Anthony Bullock is a 59 y.o. male  with past medical history significant for hypertension, diabetes mellitus, COPD, nonischemic cardiomyopathy with AICD, peripheral arterial disease status post BKA on the right and toe amputation on the left, and ESRD ([facility] unit on TTS schedule) who presented to [facility] with worsening SOB.  He had been seen at Urgent Care on 08/30/19 for fevers of unknown origin.  covid negative, but s/p left 5th toe amputation due to diabetic foot ulcer a week ago with some erythema and was sent to [facility] ED to be evaluated for possible osteo however this was negative and fever resolved.  Unfortunately he got to HD late on Saturday and only ran 2 hours due to delayed discharge from [facility].  He presented last night with worsening SOB and was found to have HTN and pulmonary edema.  We were consulted to provide HD during his hospitalization.  Past Medical History:  Diagnosis Date  . 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 Allergic rhinitis   . Anemia   . Arthritis   . Chronic systolic heart failure ([place])    a. ECHO (12/2012) EF 25-30%, HK entireanteroseptal myocardium //  b.  EF 25%, diffuse HK, grade 1 diastolic dysfunction, MAC, mild LAE, normal RVSF, trivial pericardial effusion  . COPD (chronic obstructive pulmonary disease) ([place])   . Diabetes mellitus type II   . Diabetic nephropathy ([place])   . Diabetic neuropathy ([place])   . ESRD on hemodialysis [facility])    started HD June 2017, goes to [facility] HD unit, Dr Hinda Lenis  . History of cardiac catheterization    a.Myoview 1/15:  There is significant left ventricular dysfunction. There may be slight scar at the apex. There is no significant ischemia. LV Ejection Fraction: 27%  //  b. RHC/LHC (1/15) with mean RA 6, PA  47/22 mean 33, mean PCWP 20, PVR 2.5 WU, CI 2.5; 80% dLAD stenosis, 70% diffuse large D.   . History of kidney stones   . Hyperlipidemia   . Hypertension   . Kidney stones   . NICM (nonischemic cardiomyopathy) ([place])    Primarily nonischemic. Echo (12/14) with EF 25-30%. Echo (3/15) with EF 25%, mild to moderately dilated LV, normal RV size and systolic function.   . Osteomyelitis ([place])    left fifth ray  . Pneumonia   . Urethral stricture   . Wears glasses    Past Surgical History:  Procedure Laterality Date  . ABDOMINAL AORTOGRAM W/LOWER EXTREMITY N/A 03/30/2016   Procedure: Abdominal Aortogram w/Lower Extremity;  Surgeon: Angelia Mould, MD;  Location: [place] [facility];  Service: Cardiovascular;  Laterality: N/A;  . AMPUTATION Right 04/26/2016   Procedure: Right Below Knee Amputation;  Surgeon: Newt Minion, MD;  Location: [place];  Service: Orthopedics;  Laterality: Right;  . AMPUTATION Left 08/21/2019   Procedure: LEFT FOOT 5TH RAY AMPUTATION;  Surgeon: Newt Minion, MD;  Location: [place];  Service: Orthopedics;  Laterality: Left;  . AV FISTULA PLACEMENT Right 09/08/2015   Procedure: INSERTION OF 4-34m x 45cm  ARTERIOVENOUS (AV) GORE-TEX GRAFT RIGHT UPPER  ARM;  Surgeon: CAngelia Mould MD;  Location[place]  Service: Vascular;  Laterality: Right;  . AV FISTULA PLACEMENT Left 01/14/2016   Procedure: CREATION OF LEFT UPPER ARM ARTERIOVENOUS FISTULA;  Surgeon: CAngelia Mould MD;  Location: [facility];  Service: Vascular;  Laterality: Left;  . BASCILIC VEIN TRANSPOSITION Right 08/22/2014   Procedure: RIGHT UPPER ARM BASCILIC VEIN TRANSPOSITION;  Surgeon: Angelia Mould, MD;  Location: [place];  Service: Vascular;  Laterality: Right;  . BELOW KNEE LEG AMPUTATION Right 04/26/2016  . CARDIAC CATHETERIZATION    . CARDIAC DEFIBRILLATOR PLACEMENT  06/27/2013   Sub Q       BY DR Caryl Comes  . CATARACT EXTRACTION W/PHACO Right 08/06/2018   Procedure: CATARACT EXTRACTION PHACO  AND INTRAOCULAR LENS PLACEMENT (IOC);  Surgeon: Baruch Goldmann, MD;  Location: [facility];  Service: Ophthalmology;  Laterality: Right;  CDE: 4.06  . CATARACT EXTRACTION W/PHACO Left 08/20/2018   Procedure: CATARACT EXTRACTION PHACO AND INTRAOCULAR LENS PLACEMENT (IOC);  Surgeon: Baruch Goldmann, MD;  Location: [facility];  Service: Ophthalmology;  Laterality: Left;  CDE: 6.76  . COLONOSCOPY WITH PROPOFOL N/A 07/22/2015   Procedure: COLONOSCOPY WITH PROPOFOL;  Surgeon: Doran Stabler, MD;  Location: [facility];  Service: Gastroenterology;  Laterality: N/A;  . FEMORAL-POPLITEAL BYPASS GRAFT Right 03/31/2016   Procedure: BYPASS GRAFT FEMORAL-POPLITEAL ARTERY USING RIGHT GREATER SAPHENOUS NONREVERSED VEIN;  Surgeon: Angelia Mould, MD;  Location: [place];  Service: Vascular;  Laterality: Right;  . HERNIA REPAIR    . I & D EXTREMITY Right 03/31/2016   Procedure: IRRIGATION AND DEBRIDEMENT FOOT;  Surgeon: Angelia Mould, MD;  Location: [place];  Service: Vascular;  Laterality: Right;  . IMPLANTABLE CARDIOVERTER DEFIBRILLATOR IMPLANT N/A 06/27/2013   Procedure: SUB Q ICD;  Surgeon: Deboraha Sprang, MD;  Location: [facility] CATH LAB;  Service: Cardiovascular;  Laterality: N/A;  . INTRAOPERATIVE ARTERIOGRAM Right 03/31/2016   Procedure: INTRA OPERATIVE ARTERIOGRAM;  Surgeon: Angelia Mould, MD;  Location: [place];  Service: Vascular;  Laterality: Right;  . IR GENERIC HISTORICAL Right 11/30/2015   IR THROMBECTOMY AV FISTULA W/THROMBOLYSIS/PTA INC/SHUNT/IMG RIGHT 11/30/2015 Aletta Edouard, MD [facility]  . IR GENERIC HISTORICAL  11/30/2015   IR US GUIDE VASC ACCESS RIGHT 11/30/2015 Aletta Edouard, MD [facility]  . IR GENERIC HISTORICAL Right 12/15/2015   IR THROMBECTOMY AV FISTULA W/THROMBOLYSIS/PTA/STENT INC/SHUNT/IMG RT 12/15/2015 Arne Cleveland, MD [facility]  . IR GENERIC HISTORICAL  12/15/2015   IR US GUIDE VASC ACCESS RIGHT 12/15/2015 Arne Cleveland, MD [facility]  . IR GENERIC HISTORICAL   12/28/2015   IR FLUORO GUIDE CV LINE RIGHT 12/28/2015 Marybelle Killings, MD [facility]  . IR GENERIC HISTORICAL  12/28/2015   IR US GUIDE VASC ACCESS RIGHT 12/28/2015 Marybelle Killings, MD [facility]  . LEFT A ND RIGHT HEART CATH  01/30/2013   DR Sung Amabile  . LEFT AND RIGHT HEART CATHETERIZATION WITH CORONARY ANGIOGRAM N/A 01/30/2013   Procedure: LEFT AND RIGHT HEART CATHETERIZATION WITH CORONARY ANGIOGRAM;  Surgeon: Jolaine Artist, MD;  Location: [facility] CATH LAB;  Service: Cardiovascular;  Laterality: N/A;  . PERIPHERAL VASCULAR CATHETERIZATION Right 01/26/2015   Procedure: A/V Fistulagram;  Surgeon: Angelia Mould, MD;  Location: [place] [facility];  Service: Cardiovascular;  Laterality: Right;  . reapea urethral surgery for recurrent obstruction  2011  . TOTAL KNEE ARTHROPLASTY Right 2007  . VEIN HARVEST Right 03/31/2016   Procedure: RIGHT GREATER SAPHENOUS VEIN HARVEST;  Surgeon: Angelia Mould, MD;  Location[facility] OR;  Service: Vascular;  Laterality: Right;   Family History:   Family History  Problem Relation Age of Onset  . Bladder Cancer Mother   . Alcohol abuse Father   . Melanoma Father   .  Stroke Maternal Grandmother   . Heart Problems Maternal Grandmother        unknown  . Diabetes Maternal Grandmother   . Heart disease Maternal Grandfather   . Prostate cancer Maternal Grandfather    Social History:  reports that he quit smoking about 9 years ago. His smoking use included cigarettes. He has a 64.00 pack-year smoking history. He has never used smokeless tobacco. He reports that he does not drink alcohol and does not use drugs. Allergies  Allergen Reactions  . Epoetin Alfa   . Ferumoxytol   . Morphine Sulfate Rash and Other (See Comments)    Itches all over, red spots   Prior to Admission medications   Medication Sig Start Date End Date Taking? Authorizing Provider  albuterol (PROVENTIL HFA;VENTOLIN HFA) 108 (90 Base) MCG/ACT inhaler Inhale 2 puffs into the lungs  every 4 (four) hours as needed for wheezing or shortness of breath. 02/16/18  Yes Burchette, Alinda Sierras, MD  aspirin EC 81 MG tablet Take 81 mg by mouth daily.    Yes [provider]  atorvastatin (LIPITOR) 80 MG tablet Take 1 tablet (80 mg total) by mouth at bedtime. 05/08/19  Yes Burchette, Alinda Sierras, MD  azelastine (ASTELIN) 0.1 % nasal spray Place 2 sprays into both nostrils 3 (three) times daily as needed for rhinitis. Use in each nostril as directed 03/10/14  Yes Burchette, Alinda Sierras, MD  carvedilol (COREG) 12.5 MG tablet Take 1 tablet (12.5 mg total) by mouth 2 (two) times daily with a meal. 05/08/19  Yes Burchette, Alinda Sierras, MD  cefTAZidime (FORTAZ) IVPB Inject 2 g into the vein Every Tuesday,Thursday,and Saturday with dialysis. Indication:  bacteremia Last Day of Therapy:  08/31/19 Labs - Once weekly:  CBC/D and BMP, Labs - Every other week:  ESR and CRP Method of administration: IV Push Method of administration may be changed at the discretion of home infusion pharmacist based upon assessment of the patient and/or caregiver's ability to self-administer the medication ordered. 08/20/19  Yes Johnson, Clanford L, MD  fenofibrate 160 MG tablet Take 1 tablet (160 mg total) by mouth daily. 05/08/19  Yes Burchette, Alinda Sierras, MD  fexofenadine (ALLEGRA) 180 MG tablet Take 180 mg by mouth daily.   Yes [provider]  furosemide (LASIX) 40 MG tablet Take 3 tablets by mouth once daily 08/07/18  Yes Burchette, Alinda Sierras, MD  gabapentin (NEURONTIN) 300 MG capsule Take 2 capsules (600 mg total) by mouth 2 (two) times daily. 05/08/19  Yes Burchette, Alinda Sierras, MD  glucose blood test strip Check 1 time daily. E11.9 One Touch Ultra Blue Test Strips 03/10/14  Yes Burchette, Alinda Sierras, MD  Insulin Glargine [facility] KWIKPEN) 100 UNIT/ML INJECT 70 UNITS SUBCUTANEOUSLY AT BEDTIME 05/08/19  Yes Burchette, Alinda Sierras, MD  Insulin Lispro (HUMALOG KWIKPEN) 200 UNIT/ML SOPN Inject 10 Units into the skin 3 (three) times daily  with meals. Use tid with meals as per sliding scale 03/16/18  Yes Burchette, Alinda Sierras, MD  Insulin Pen Needle (BD PEN NEEDLE NANO U/F) 32G X 4 MM MISC USE 1 PEN NEEDLE SUBCUTANEOUSLY WITH INSULIN 4 TIMES DAILY 12/21/18  Yes Burchette, Alinda Sierras, MD  lanthanum (FOSRENOL) 1000 MG chewable tablet Chew 1,000 mg by mouth 3 (three) times daily with meals. Take one tablet after each meal.    Yes [provider]  multivitamin (RENA-VIT) TABS tablet Take 1 tablet by mouth daily. 03/21/19  Yes Burchette, Alinda Sierras, MD  Olopatadine HCl (PATADAY) 0.2 % SOLN  Place 1 drop into both eyes daily as needed (for allergies).    Yes [provider]  oxyCODONE-acetaminophen (PERCOCET) 5-325 MG tablet Take 1 tablet by mouth every 4 (four) hours as needed. 08/21/19 08/20/20 Yes Persons, Bevely Palmer, [place]  vancomycin IVPB Inject 2,000 mg into the vein Every Tuesday,Thursday,and Saturday with dialysis. Indication:  bacteremia First Dose: No Last Day of Therapy:  08/31/19 Labs - Sunday/Monday:  CBC/D, BMP, and vancomycin trough. Labs - Thursday:  BMP and vancomycin trough Labs - Every other week:  ESR and CRP Method of administration:Elastomeric Method of administration may be changed at the discretion of the patient and/or caregiver's ability to self-administer the medication ordered. 08/20/19  Yes Johnson, Clanford L, MD  Continuous Blood Gluc Sensor (FREESTYLE LIBRE 14 DAY SENSOR) MISC USE AS DIRECTED EVERY 14 DAYS 09/03/19   Burchette, Alinda Sierras, MD   Current Facility-Administered Medications  Medication Dose Route Frequency Provider Last Rate Last Admin  . 0.9 %  sodium chloride infusion  250 mL Intravenous PRN Zierle-Ghosh, Asia B, DO      . acetaminophen (TYLENOL) tablet 650 mg  650 mg Oral Q4H PRN Zierle-Ghosh, Asia B, DO   650 mg at 09/03/19 0907  . albuterol (PROVENTIL) (2.5 MG/3ML) 0.083% nebulizer solution 2.5 mg  2.5 mg Nebulization Q4H PRN Zierle-Ghosh, Asia B, DO      . alum & mag hydroxide-simeth  (MAALOX/MYLANTA) 200-200-20 MG/5ML suspension 30 mL  30 mL Oral Q4H PRN Amin, Ankit Chirag, MD      . aspirin EC tablet 81 mg  81 mg Oral Daily Zierle-Ghosh, Asia B, DO   81 mg at 09/03/19 0908  . atorvastatin (LIPITOR) tablet 80 mg  80 mg Oral QHS Zierle-Ghosh, Asia B, DO      . azelastine (ASTELIN) 0.1 % nasal spray 2 spray  2 spray Each Nare TID PRN Zierle-Ghosh, Asia B, DO      . carvedilol (COREG) tablet 12.5 mg  12.5 mg Oral BID WC Zierle-Ghosh, Asia B, DO      . Chlorhexidine Gluconate Cloth 2 % PADS 6 each  6 each Topical Q0600 Donato Heinz, MD      . dextromethorphan-guaiFENesin (MUCINEX DM) 30-600 MG per 12 hr tablet 1 tablet  1 tablet Oral BID PRN Amin, Ankit Chirag, MD      . fenofibrate tablet 160 mg  160 mg Oral Daily Zierle-Ghosh, Asia B, DO   160 mg at 09/03/19 1028  . gabapentin (NEURONTIN) capsule 600 mg  600 mg Oral BID Zierle-Ghosh, Asia B, DO   600 mg at 09/03/19 0907  . heparin injection 5,000 Units  5,000 Units Subcutaneous Q8H Zierle-Ghosh, Asia B, DO      . hydrocortisone (ANUSOL-HC) 2.5 % rectal cream 1 application  1 application Topical QID PRN Amin, Ankit Chirag, MD      . hydrocortisone cream 1 % 1 application  1 application Topical TID PRN Amin, Ankit Chirag, MD      . insulin aspart (novoLOG) injection 0-15 Units  0-15 Units Subcutaneous TID WC Zierle-Ghosh, Asia B, DO   8 Units at 09/03/19 0916  . insulin glargine (LANTUS) injection 50 Units  50 Units Subcutaneous Q2200 Amin, Ankit Chirag, MD      . lip balm (CARMEX) ointment 1 application  1 application Topical PRN Amin, Ankit Chirag, MD      . loratadine (CLARITIN) tablet 10 mg  10 mg Oral Daily Zierle-Ghosh, Asia B, DO   10 mg at 09/03/19 0907  .  loratadine (CLARITIN) tablet 10 mg  10 mg Oral Daily PRN Amin, Ankit Chirag, MD      . multivitamin (RENA-VIT) tablet 1 tablet  1 tablet Oral QHS Amin, Ankit Chirag, MD      . Muscle Rub CREA 1 application  1 application Topical PRN Amin, Ankit Chirag, MD      .  nitroGLYCERIN 50 mg in dextrose 5 % 250 mL (0.2 mg/mL) infusion  5-200 mcg/min Intravenous Continuous Zierle-Ghosh, Asia B, DO 12 mL/hr at 09/03/19 0511 40 mcg/min at 09/03/19 0511  . ondansetron (ZOFRAN) injection 4 mg  4 mg Intravenous Q6H PRN Zierle-Ghosh, Asia B, DO      . phenol (CHLORASEPTIC) mouth spray 1 spray  1 spray Mouth/Throat PRN Amin, Ankit Chirag, MD      . polyethylene glycol (MIRALAX / GLYCOLAX) packet 17 g  17 g Oral Daily PRN Amin, Ankit Chirag, MD      . polyvinyl alcohol (LIQUIFILM TEARS) 1.4 % ophthalmic solution 1 drop  1 drop Both Eyes PRN Amin, Ankit Chirag, MD      . senna-docusate (Senokot-S) tablet 2 tablet  2 tablet Oral QHS PRN Amin, Ankit Chirag, MD      . sodium chloride (OCEAN) 0.65 % nasal spray 1 spray  1 spray Each Nare PRN Amin, Ankit Chirag, MD      . sodium chloride flush (NS) 0.9 % injection 3 mL  3 mL Intravenous Q12H Zierle-Ghosh, Asia B, DO      . sodium chloride flush (NS) 0.9 % injection 3 mL  3 mL Intravenous PRN Zierle-Ghosh, Asia B, DO       Current Outpatient Medications  Medication Sig Dispense Refill  . albuterol (PROVENTIL HFA;VENTOLIN HFA) 108 (90 Base) MCG/ACT inhaler Inhale 2 puffs into the lungs every 4 (four) hours as needed for wheezing or shortness of breath. 1 Inhaler 1  . aspirin EC 81 MG tablet Take 81 mg by mouth daily.     [name] atorvastatin (LIPITOR) 80 MG tablet Take 1 tablet (80 mg total) by mouth at bedtime. 90 tablet 3  . azelastine (ASTELIN) 0.1 % nasal spray Place 2 sprays into both nostrils 3 (three) times daily as needed for rhinitis. Use in each nostril as directed 30 mL 12  . carvedilol (COREG) 12.5 MG tablet Take 1 tablet (12.5 mg total) by mouth 2 (two) times daily with a meal. 180 tablet 3  . cefTAZidime (FORTAZ) IVPB Inject 2 g into the vein Every Tuesday,Thursday,and Saturday with dialysis. Indication:  bacteremia Last Day of Therapy:  08/31/19 Labs - Once weekly:  CBC/D and BMP, Labs - Every other week:  ESR and  CRP Method of administration: IV Push Method of administration may be changed at the discretion of home infusion pharmacist based upon assessment of the patient and/or caregiver's ability to self-administer the medication ordered. 6 Units 0  . fenofibrate 160 MG tablet Take 1 tablet (160 mg total) by mouth daily. 90 tablet 3  . fexofenadine (ALLEGRA) 180 MG tablet Take 180 mg by mouth daily.    . furosemide (LASIX) 40 MG tablet Take 3 tablets by mouth once daily 270 tablet 0  . gabapentin (NEURONTIN) 300 MG capsule Take 2 capsules (600 mg total) by mouth 2 (two) times daily. 360 capsule 3  . glucose blood test strip Check 1 time daily. E11.9 One Touch Ultra Blue Test Strips 100 each 3  . Insulin Glargine (BASAGLAR KWIKPEN) 100 UNIT/ML INJECT 70 UNITS SUBCUTANEOUSLY AT BEDTIME 15 mL 11  .  Insulin Lispro (HUMALOG KWIKPEN) 200 UNIT/ML SOPN Inject 10 Units into the skin 3 (three) times daily with meals. Use tid with meals as per sliding scale 4 pen 6  . Insulin Pen Needle (BD PEN NEEDLE NANO U/F) 32G X 4 MM MISC USE 1 PEN NEEDLE SUBCUTANEOUSLY WITH INSULIN 4 TIMES DAILY 400 each 0  . lanthanum (FOSRENOL) 1000 MG chewable tablet Chew 1,000 mg by mouth 3 (three) times daily with meals. Take one tablet after each meal.     . multivitamin (RENA-VIT) TABS tablet Take 1 tablet by mouth daily. 90 tablet 3  . Olopatadine HCl (PATADAY) 0.2 % SOLN Place 1 drop into both eyes daily as needed (for allergies).     . oxyCODONE-acetaminophen (PERCOCET) 5-325 MG tablet Take 1 tablet by mouth every 4 (four) hours as needed. 30 tablet 0  . vancomycin IVPB Inject 2,000 mg into the vein Every Tuesday,Thursday,and Saturday with dialysis. Indication:  bacteremia First Dose: No Last Day of Therapy:  08/31/19 Labs - Sunday/Monday:  CBC/D, BMP, and vancomycin trough. Labs - Thursday:  BMP and vancomycin trough Labs - Every other week:  ESR and CRP Method of administration:Elastomeric Method of administration may be changed  at the discretion of the patient and/or caregiver's ability to self-administer the medication ordered. 6 Units 0  . Continuous Blood Gluc Sensor (FREESTYLE LIBRE 14 DAY SENSOR) MISC USE AS DIRECTED EVERY 14 DAYS 2 each 0   Labs: Basic Metabolic Panel: Recent Labs  Lab 08/30/19 1729 09/03/19 0104  NA 132* 129*  K 4.2 4.6  CL 94* 91*  CO2 25 24  GLUCOSE 218* 275*  BUN 34* 60*  CREATININE 9.23* 12.95*  CALCIUM 8.6* 8.6*   Liver Function Tests: Recent Labs  Lab 08/30/19 1729  AST 24  ALT 15  ALKPHOS 58  BILITOT 1.4*  PROT 7.0  ALBUMIN 2.6*   No results for input(s): LIPASE, AMYLASE in the last 168 hours. No results for input(s): AMMONIA in the last 168 hours. CBC: Recent Labs  Lab 08/30/19 1729 09/03/19 0104  WBC 14.3* 9.1  NEUTROABS 12.0* 7.4  HGB 9.6* 9.1*  HCT 31.5* 29.4*  MCV 90.0 88.6  PLT 179 207   Cardiac Enzymes: No results for input(s): CKTOTAL, CKMB, CKMBINDEX, TROPONINI in the last 168 hours. CBG: Recent Labs  Lab 08/31/19 0829 09/03/19 0911  GLUCAP 91 265*   Iron Studies: No results for input(s): IRON, TIBC, TRANSFERRIN, FERRITIN in the last 72 hours. Studies/Results: DG Chest Port 1 View  Result Date: 09/03/2019 CLINICAL DATA:  Increased shortness of breath EXAM: PORTABLE CHEST 1 VIEW COMPARISON:  Earlier today FINDINGS: Cardiomegaly with Kerley lines and cephalized blood flow. Fissural thickening and small effusions. Dialysis catheter on the right and direct defibrillator lead on the left. IMPRESSION: CHF. Electronically Signed   By: Jonathon  Watts M.D.   On: 09/03/2019 04:22   DG Chest Port 1 View  Result Date: 09/03/2019 CLINICAL DATA:  Dyspnea EXAM: PORTABLE CHEST 1 VIEW COMPARISON:  08/30/2019 FINDINGS: Lungs are clear. No pneumothorax or pleural effusion. Right internal jugular hemodialysis catheter with its tip at the superior cavoatrial junction and epicardial defibrillator are unchanged. Mild cardiomegaly is stable. Pulmonary vascularity  is normal. No acute bone abnormality. IMPRESSION: No acute cardiopulmonary process. Electronically Signed   By: Ashesh  Parikh MD   On: 09/03/2019 00:23    ROS: Pertinent items are noted in HPI. Physical Exam: Vitals:   09/03/19 0730 09/03/19 0900 09/03/19 0930 09/03/19 1000  BP: (!) 163/72 (!)   170/89 (!) 164/82 (!) 157/74  Pulse: 88 93 92 86  Resp: 20 (!) 23 12 13  Temp:      TempSrc:      SpO2: 96% 98% 98% 100%  Weight:      Height:          Weight change:   Intake/Output Summary (Last 24 hours) at 09/03/2019 1207 Last data filed at 09/03/2019 1030 Gross per 24 hour  Intake 240 ml  Output --  Net 240 ml   BP (!) 157/74   Pulse 86   Temp 98.2 F (36.8 C) (Oral)   Resp 13   Ht 5' 10" (1.778 m)   Wt 96.6 kg   SpO2 100%   BMI 30.56 kg/m  General appearance: alert, cooperative and no distress Head: Normocephalic, without obvious abnormality, atraumatic Resp: clear to auscultation bilaterally Cardio: regular rate and rhythm, S1, S2 normal, no murmur, click, rub or gallop GI: soft, non-tender; bowel sounds normal; no masses,  no organomegaly Extremities: no edema, s/p RBKA, s/p left 5th toe amputation Dialysis Access:  Dialysis Orders: Center: [facility]  on TTS . EDW 92,5kg HD [place] 2K/2.5Ca  Time 225 minutes Heparin  1900. Access RIJ TDC (has been in place for 2 years) BFR 400 DFR 600    Hectoral 3.5 mcg IV/HD Epogen 3,000 Units IV/HD  Venofer  50 mg IV q week   Assessment/Plan: 1.  SOB- due to volume overload.  Plan for HD with UF. 2. Recurring fevers of unknown origin- has been going on for several weeks per his wife and get worse after dialysis.  Concerning for catheter related bacteremia.  Will check blood cultures from HD catheter today  3.  ESRD -  Cont with HD qTTS 4.  Hypertension/volume  -  Overloaded due to shortened treatment on Saturday.  UF as tolerated 5.  Anemia  -  ESA 6.  Metabolic bone disease -  Cont with home meds 7.  Nutrition -  Renal diet,  carb modified.   A. , MD [facility] Pager [phone] 09/03/2019, 12:07 PM        

## 2019-09-03 NOTE — H&P (Signed)
TRH H&P    Patient Demographics:    Anthony Bullock, is a 59 y.o. male  MRN: [id number]  DOB - Dec 22, 1960  Admit Date - 09/02/2019  Referring MD/NP/PA: Christy Gentles  Outpatient Primary MD for the patient is [name], MD  Patient coming from: Home  Chief complaint- dyspnea   HPI:    Anthony Bullock  is a 59 y.o. male, with history of osteomyelitis, nonischemic cardiomyopathy, nephrolithiasis, hypertension, hyperlipidemia, ESRD on hemodialysis Tuesday Thursday Saturday, diabetic neuropathy, diabetic nephropathy, diabetes mellitus type 2, COPD, AICD, and more presents to the ED with a chief complaint of shortness of breath.  Wife Manuela Schwartz is at bedside, and patient asked me to direct most of my questions to her.  She reports that they were sitting when he felt suddenly short of breath, and requested to go to the ER.  Wife reports that earlier in the day he had been coughing like a dry hacking cough.  He has been compliant with his Lasix.  He has been compliant with his dialysis.  He has had no salty meals per wife's report.  She reports that he follows a good low-sodium diet.  He has had intermittent fevers since his toe amputation.  They report that they have been in the ER several times since his toe was amputated.  He had no fever in the ER today.  Lastly patient describes decrease in urine output over the last 2 to 3 weeks.  His last dialysis was Saturday, he had 2-1/2 L off, he was only 2/10 of a pound over his dry weight at the end of dialysis.  Patient has not complained of chest pain, palpitations.  Patient tells me he has not been nauseous, but wife reports that on Saturday he did have one episode of nausea and vomit.  There was no hematemesis.  Patient reports that he felt a little bloated in his abdomen earlier today, but the feeling is gone away.  He has no other complaints at this time.  In the ED Temperature 98.2,  pulse up to 118, blood pressure up to 219/144, current blood pressure 139/53, satting at 94% on 5 L nasal cannula No leukocytosis Hemoglobin is in the nines down from 12 earlier this month Sodium is 129 Glucose is 275 Creatinine is 12.95, usually in the nines but has also been up to 11's. ED provider spoke with nephrology who plans for dialysis today.    Review of systems:    In addition to the HPI above,  No Fever-chills, No Headache, No changes with Vision or hearing, No problems swallowing food or Liquids, No Chest pain, admits to cough and shortness of breath No Abdominal pain, admits to 1 episode of nausea and vomiting 3 days ago bowel movements are regular, No Blood in stool or Urine, No dysuria, admits to decreased urine output No new skin rashes or bruises, No new joints pains-aches,  No new weakness, tingling, numbness in any extremity, No recent weight gain or loss, No polyuria, polydypsia or polyphagia, No significant Mental Stressors.  All  other systems reviewed and are negative.    Past History of the following :    Past Medical History:  Diagnosis Date  . 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 Allergic rhinitis   . Anemia   . Arthritis   . Chronic systolic heart failure ([place])    a. ECHO (12/2012) EF 25-30%, HK entireanteroseptal myocardium //  b.  EF 25%, diffuse HK, grade 1 diastolic dysfunction, MAC, mild LAE, normal RVSF, trivial pericardial effusion  . COPD (chronic obstructive pulmonary disease) ([place])   . Diabetes mellitus type II   . Diabetic nephropathy ([place])   . Diabetic neuropathy ([place])   . ESRD on hemodialysis [facility])    started HD June 2017, goes to [facility] HD unit, Dr Hinda Lenis  . History of cardiac catheterization    a.Myoview 1/15:  There is significant left ventricular dysfunction. There may be slight scar at the apex. There is no significant ischemia. LV Ejection Fraction: 27%  //  b. RHC/LHC (1/15)  with mean RA 6, PA 47/22 mean 33, mean PCWP 20, PVR 2.5 WU, CI 2.5; 80% dLAD stenosis, 70% diffuse large D.   . History of kidney stones   . Hyperlipidemia   . Hypertension   . Kidney stones   . NICM (nonischemic cardiomyopathy) ([place])    Primarily nonischemic. Echo (12/14) with EF 25-30%. Echo (3/15) with EF 25%, mild to moderately dilated LV, normal RV size and systolic function.   . Osteomyelitis ([place])    left fifth ray  . Pneumonia   . Urethral stricture   . Wears glasses       Past Surgical History:  Procedure Laterality Date  . ABDOMINAL AORTOGRAM W/LOWER EXTREMITY N/A 03/30/2016   Procedure: Abdominal Aortogram w/Lower Extremity;  Surgeon: Angelia Mould, MD;  Location: [place] CV LAB;  Service: Cardiovascular;  Laterality: N/A;  . AMPUTATION Right 04/26/2016   Procedure: Right Below Knee Amputation;  Surgeon: Newt Minion, MD;  Location: [place];  Service: Orthopedics;  Laterality: Right;  . AMPUTATION Left 08/21/2019   Procedure: LEFT FOOT 5TH RAY AMPUTATION;  Surgeon: Newt Minion, MD;  Location: [place];  Service: Orthopedics;  Laterality: Left;  . AV FISTULA PLACEMENT Right 09/08/2015   Procedure: INSERTION OF 4-17m x 45cm  ARTERIOVENOUS (AV) GORE-TEX GRAFT RIGHT UPPER  ARM;  Surgeon: CAngelia Mould MD;  [place]  Service: Vascular;  Laterality: Right;  . AV FISTULA PLACEMENT Left 01/14/2016   Procedure: CREATION OF LEFT UPPER ARM ARTERIOVENOUS FISTULA;  Surgeon: CAngelia Mould MD;  [place]  Service: Vascular;  Laterality: Left;  . BASCILIC VEIN TRANSPOSITION Right 08/22/2014   Procedure: RIGHT UPPER ARM BASCILIC VEIN TRANSPOSITION;  Surgeon: CAngelia Mould MD;  [place]  Service: Vascular;  Laterality: Right;  . BELOW KNEE LEG AMPUTATION Right 04/26/2016  . CARDIAC CATHETERIZATION    . CARDIAC DEFIBRILLATOR PLACEMENT  06/27/2013   Sub Q       BY DR KCaryl Comes . CATARACT EXTRACTION W/PHACO Right 08/06/2018   Procedure:  CATARACT EXTRACTION PHACO AND INTRAOCULAR LENS PLACEMENT (IOC);  Surgeon: WBaruch Goldmann MD;  [facility]: [facility];  Service: Ophthalmology;  Laterality: Right;  CDE: 4.06  . CATARACT EXTRACTION W/PHACO Left 08/20/2018   Procedure: CATARACT EXTRACTION PHACO AND INTRAOCULAR LENS PLACEMENT (IOC);  Surgeon: WBaruch Goldmann MD;  Location: [facility];  Service: Ophthalmology;  Laterality: Left;  CDE: 6.76  . COLONOSCOPY WITH PROPOFOL N/A 07/22/2015  Procedure: COLONOSCOPY WITH PROPOFOL;  Surgeon: Doran Stabler, MD;  [facility];  Service: Gastroenterology;  Laterality: N/A;  . FEMORAL-POPLITEAL BYPASS GRAFT Right 03/31/2016   Procedure: BYPASS GRAFT FEMORAL-POPLITEAL ARTERY USING RIGHT GREATER SAPHENOUS NONREVERSED VEIN;  Surgeon: Angelia Mould, MD;  Location: [place];  Service: Vascular;  Laterality: Right;  . HERNIA REPAIR    . I & D EXTREMITY Right 03/31/2016   Procedure: IRRIGATION AND DEBRIDEMENT FOOT;  Surgeon: Angelia Mould, MD;  Location: [place];  Service: Vascular;  Laterality: Right;  . IMPLANTABLE CARDIOVERTER DEFIBRILLATOR IMPLANT N/A 06/27/2013   Procedure: SUB Q ICD;  Surgeon: Deboraha Sprang, MD;  Location: [facility] CATH LAB;  Service: Cardiovascular;  Laterality: N/A;  . INTRAOPERATIVE ARTERIOGRAM Right 03/31/2016   Procedure: INTRA OPERATIVE ARTERIOGRAM;  Surgeon: Angelia Mould, MD;  Location: [place];  Service: Vascular;  Laterality: Right;  . IR GENERIC HISTORICAL Right 11/30/2015   IR THROMBECTOMY AV FISTULA W/THROMBOLYSIS/PTA INC/SHUNT/IMG RIGHT 11/30/2015 Aletta Edouard, MD [facility]  . IR GENERIC HISTORICAL  11/30/2015   IR US GUIDE VASC ACCESS RIGHT 11/30/2015 Aletta Edouard, MD [facility]  . IR GENERIC HISTORICAL Right 12/15/2015   IR THROMBECTOMY AV FISTULA W/THROMBOLYSIS/PTA/STENT INC/SHUNT/IMG RT 12/15/2015 Arne Cleveland, MD [facility]  . IR GENERIC HISTORICAL  12/15/2015   IR US GUIDE VASC ACCESS RIGHT 12/15/2015 Arne Cleveland, MD [facility]  .  IR GENERIC HISTORICAL  12/28/2015   IR FLUORO GUIDE CV LINE RIGHT 12/28/2015 Marybelle Killings, MD [facility]  . IR GENERIC HISTORICAL  12/28/2015   IR US GUIDE VASC ACCESS RIGHT 12/28/2015 Marybelle Killings, MD [facility]  . LEFT A ND RIGHT HEART CATH  01/30/2013   DR Sung Amabile  . LEFT AND RIGHT HEART CATHETERIZATION WITH CORONARY ANGIOGRAM N/A 01/30/2013   Procedure: LEFT AND RIGHT HEART CATHETERIZATION WITH CORONARY ANGIOGRAM;  Surgeon: Jolaine Artist, MD;  Location: [facility] CATH LAB;  Service: Cardiovascular;  Laterality: N/A;  . PERIPHERAL VASCULAR CATHETERIZATION Right 01/26/2015   Procedure: A/V Fistulagram;  Surgeon: Angelia Mould, MD;  Location: [place] CV LAB;  Service: Cardiovascular;  Laterality: Right;  . reapea urethral surgery for recurrent obstruction  2011  . TOTAL KNEE ARTHROPLASTY Right 2007  . VEIN HARVEST Right 03/31/2016   Procedure: RIGHT GREATER SAPHENOUS VEIN HARVEST;  Surgeon: Angelia Mould, MD;  Location: [facility] OR;  Service: Vascular;  Laterality: Right;      Social History:      Social History   Tobacco Use  . Smoking status: Former Smoker    Packs/day: 2.00    Years: 32.00    Pack years: 64.00    Types: Cigarettes    Quit date: 05/11/2010    Years since quitting: 9.3  . Smokeless tobacco: Never Used  Substance Use Topics  . Alcohol use: No       Family History :     Family History  Problem Relation Age of Onset  . Bladder Cancer Mother   . Alcohol abuse Father   . Melanoma Father   . Stroke Maternal Grandmother   . Heart Problems Maternal Grandmother        unknown  . Diabetes Maternal Grandmother   . Heart disease Maternal Grandfather   . Prostate cancer Maternal Grandfather       Home Medications:   Prior to Admission medications   Medication Sig Start Date End Date Taking? Authorizing Provider  albuterol (PROVENTIL HFA;VENTOLIN HFA) 108 (90 Base) MCG/ACT inhaler Inhale 2  puffs into the lungs every 4 (four) hours as needed  for wheezing or shortness of breath. 02/16/18   Burchette, Alinda Sierras, MD  aspirin EC 81 MG tablet Take 81 mg by mouth daily.     [provider]  atorvastatin (LIPITOR) 80 MG tablet Take 1 tablet (80 mg total) by mouth at bedtime. 05/08/19   Burchette, Alinda Sierras, MD  azelastine (ASTELIN) 0.1 % nasal spray Place 2 sprays into both nostrils 3 (three) times daily as needed for rhinitis. Use in each nostril as directed 03/10/14   Burchette, Alinda Sierras, MD  carvedilol (COREG) 12.5 MG tablet Take 1 tablet (12.5 mg total) by mouth 2 (two) times daily with a meal. 05/08/19   Burchette, Alinda Sierras, MD  cefTAZidime (FORTAZ) IVPB Inject 2 g into the vein Every Tuesday,Thursday,and Saturday with dialysis. Indication:  bacteremia Last Day of Therapy:  08/31/19 Labs - Once weekly:  CBC/D and BMP, Labs - Every other week:  ESR and CRP Method of administration: IV Push Method of administration may be changed at the discretion of home infusion pharmacist based upon assessment of the patient and/or caregiver's ability to self-administer the medication ordered. 08/20/19   Johnson, Clanford L, MD  Continuous Blood Gluc Sensor (FREESTYLE LIBRE 14 DAY SENSOR) MISC USE AS DIRECTED EVERY 14 DAYS 08/02/19   Burchette, Alinda Sierras, MD  fenofibrate 160 MG tablet Take 1 tablet (160 mg total) by mouth daily. 05/08/19   Burchette, Alinda Sierras, MD  fexofenadine (ALLEGRA) 180 MG tablet Take 180 mg by mouth daily.    [provider]  furosemide (LASIX) 40 MG tablet Take 3 tablets by mouth once daily 08/07/18   Burchette, Alinda Sierras, MD  gabapentin (NEURONTIN) 300 MG capsule Take 2 capsules (600 mg total) by mouth 2 (two) times daily. 05/08/19   Burchette, Alinda Sierras, MD  glucose blood test strip Check 1 time daily. E11.9 One Touch Ultra Blue Test Strips 03/10/14   [name], MD  Insulin Glargine [facility]) 100 UNIT/ML INJECT 70 UNITS SUBCUTANEOUSLY AT BEDTIME 05/08/19   Burchette, Alinda Sierras, MD  Insulin Lispro (HUMALOG KWIKPEN) 200  UNIT/ML SOPN Inject 10 Units into the skin 3 (three) times daily with meals. Use tid with meals as per sliding scale 03/16/18   Burchette, Alinda Sierras, MD  Insulin Pen Needle (BD PEN NEEDLE NANO U/F) 32G X 4 MM MISC USE 1 PEN NEEDLE SUBCUTANEOUSLY WITH INSULIN 4 TIMES DAILY 12/21/18   Burchette, Alinda Sierras, MD  lanthanum (FOSRENOL) 1000 MG chewable tablet Chew 1,000 mg by mouth 3 (three) times daily with meals. Take one tablet after each meal.     [provider]  multivitamin (RENA-VIT) TABS tablet Take 1 tablet by mouth daily. 03/21/19   Burchette, Alinda Sierras, MD  Olopatadine HCl (PATADAY) 0.2 % SOLN Place 1 drop into both eyes daily as needed (for allergies).     [provider]  oxyCODONE-acetaminophen (PERCOCET) 5-325 MG tablet Take 1 tablet by mouth every 4 (four) hours as needed. 08/21/19 08/20/20  Persons, Bevely Palmer, PA  vancomycin IVPB Inject 2,000 mg into the vein Every Tuesday,Thursday,and Saturday with dialysis. Indication:  bacteremia First Dose: No Last Day of Therapy:  08/31/19 Labs - Sunday/Monday:  CBC/D, BMP, and vancomycin trough. Labs - Thursday:  BMP and vancomycin trough Labs - Every other week:  ESR and CRP Method of administration:Elastomeric Method of administration may be changed at the discretion of the patient and/or caregiver's ability to self-administer the medication ordered. 08/20/19  Murlean Iba, MD     Allergies:     Allergies  Allergen Reactions  . Epoetin Alfa   . Ferumoxytol   . Morphine Sulfate Rash and Other (See Comments)    Itches all over, red spots     Physical Exam:   Vitals  Blood pressure (!) 139/55, pulse 95, temperature 98.2 F (36.8 C), temperature source Oral, resp. rate (!) 21, height _0  (1.778 m), weight 96.6 kg, SpO2 97 %.  1.  General: Patient lying in bed, drowsy, no acute distress  2. Psychiatric: Mood and behavior normal for situation  3. Neurologic: Cranial nerves II through XII are grossly intact no  focal deficits on limited exam alert and oriented x3  4. HEENMT:  Head is atraumatic, normocephalic, pupils reactive to light, neck is supple, trachea is midline, mucous membranes are moist  5. Respiratory : Mild crackles auscultated in the lower lung fields, no wheeze no rhonchi  6. Cardiovascular : Heart rate is tachycardic, rhythm is regular, no murmurs rubs or gallops  7. Gastrointestinal:  Abdomen is soft, nondistended, nontender to palpation  8. Skin:  No acute lesions on limited skin exam  9.Musculoskeletal:  Left foot in walking boot status post toe amputation, right BKA    Data Review:    CBC Recent Labs  Lab 08/30/19 1729 09/03/19 0104  WBC 14.3* 9.1  HGB 9.6* 9.1*  HCT 31.5* 29.4*  PLT 179 207  MCV 90.0 88.6  MCH 27.4 27.4  MCHC 30.5 31.0  RDW 15.1 14.8  LYMPHSABS 0.8 0.8  MONOABS 1.2* 0.6  EOSABS 0.1 0.3  BASOSABS 0.1 0.1   ------------------------------------------------------------------------------------------------------------------  Results for orders placed or performed during the hospital encounter of 09/02/19 (from the past 48 hour(s))  SARS Coronavirus 2 by RT PCR (hospital order, performed in [facility] hospital lab) Nasopharyngeal Nasopharyngeal Swab     Status: None   Collection Time: 09/02/19 11:27 PM   Specimen: Nasopharyngeal Swab  Result Value Ref Range   SARS Coronavirus 2 NEGATIVE NEGATIVE    Comment: (NOTE) SARS-CoV-2 target nucleic acids are NOT DETECTED.  The SARS-CoV-2 RNA is generally detectable in upper and lower respiratory specimens during the acute phase of infection. The lowest concentration of SARS-CoV-2 viral copies this assay can detect is 250 copies / mL. A negative result does not preclude SARS-CoV-2 infection and should not be used as the sole basis for treatment or other patient management decisions.  A negative result may occur with improper specimen collection / handling, submission of specimen other than  nasopharyngeal swab, presence of viral mutation(s) within the areas targeted by this assay, and inadequate number of viral copies (<250 copies / mL). A negative result must be combined with clinical observations, patient history, and epidemiological information.  Fact Sheet for Patients:   StrictlyIdeas.no  Fact Sheet for Healthcare Providers: BankingDealers.co.za  This test is not yet approved or  cleared by the [place] FDA and has been authorized for detection and/or diagnosis of SARS-CoV-2 by FDA under an Emergency Use Authorization (EUA).  This EUA will remain in effect (meaning this test can be used) for the duration of the COVID-19 declaration under Section 564(b)(1) of the Act, 21 U.S.C. section 360bbb-3(b)(1), unless the authorization is terminated or revoked sooner.  Performed at [facility], [address], [place], [place] [address]   Basic metabolic panel     Status: Abnormal   Collection Time: 09/03/19  1:04 AM  Result Value Ref Range   Sodium  129 (L) 135 - 145 mmol/L   Potassium 4.6 3.5 - 5.1 mmol/L   Chloride 91 (L) 98 - 111 mmol/L   CO2 24 22 - 32 mmol/L   Glucose, Bld 275 (H) 70 - 99 mg/dL    Comment: Glucose reference range applies only to samples taken after fasting for at least 8 hours.   BUN 60 (H) 6 - 20 mg/dL   Creatinine, Ser 12.95 (H) 0.61 - 1.24 mg/dL   Calcium 8.6 (L) 8.9 - 10.3 mg/dL   GFR calc non Af Amer 4 (L) >60 mL/min   GFR calc Af Amer 4 (L) >60 mL/min   Anion gap 14 5 - 15    Comment: Performed at [facility], [address] [place], [place] [address]  CBC with Differential/Platelet     Status: Abnormal   Collection Time: 09/03/19  1:04 AM  Result Value Ref Range   WBC 9.1 4.0 - 10.5 K/uL   RBC 3.32 (L) 4.22 - 5.81 MIL/uL   Hemoglobin 9.1 (L) 13.0 - 17.0 g/dL   HCT 29.4 (L) 39 - 52 %   MCV 88.6 80.0 - 100.0 fL   MCH 27.4 26.0 - 34.0 pg   MCHC 31.0 30.0 - 36.0 g/dL   RDW 14.8 11.5 -  15.5 %   Platelets 207 150 - 400 K/uL   nRBC 0.0 0.0 - 0.2 %   Neutrophils Relative % 81 %   Neutro Abs 7.4 1.7 - 7.7 K/uL   Lymphocytes Relative 9 %   Lymphs Abs 0.8 0.7 - 4.0 K/uL   Monocytes Relative 6 %   Monocytes Absolute 0.6 0 - 1 K/uL   Eosinophils Relative 3 %   Eosinophils Absolute 0.3 0 - 0 K/uL   Basophils Relative 1 %   Basophils Absolute 0.1 0 - 0 K/uL   Immature Granulocytes 0 %   Abs Immature Granulocytes 0.02 0.00 - 0.07 K/uL    Comment: Performed at [facility], [address], [place], [place] [address]  Brain natriuretic peptide     Status: Abnormal   Collection Time: 09/03/19  1:04 AM  Result Value Ref Range   B Natriuretic Peptide 1,193.0 (H) 0.0 - 100.0 pg/mL    Comment: Performed at [facility], [address], [place], [place] [address]    Chemistries  Recent Labs  Lab 08/30/19 1729 09/03/19 0104  NA 132* 129*  K 4.2 4.6  CL 94* 91*  CO2 25 24  GLUCOSE 218* 275*  BUN 34* 60*  CREATININE 9.23* 12.95*  CALCIUM 8.6* 8.6*  AST 24  --   ALT 15  --   ALKPHOS 58  --   BILITOT 1.4*  --    ------------------------------------------------------------------------------------------------------------------  ------------------------------------------------------------------------------------------------------------------ GFR: Estimated Creatinine Clearance: 7.3 mL/min (A) (by C-G formula based on SCr of 12.95 mg/dL (H)). Liver Function Tests: Recent Labs  Lab 08/30/19 1729  AST 24  ALT 15  ALKPHOS 58  BILITOT 1.4*  PROT 7.0  ALBUMIN 2.6*   No results for input(s): LIPASE, AMYLASE in the last 168 hours. No results for input(s): AMMONIA in the last 168 hours. Coagulation Profile: No results for input(s): INR, PROTIME in the last 168 hours. Cardiac Enzymes: No results for input(s): CKTOTAL, CKMB, CKMBINDEX, TROPONINI in the last 168 hours. BNP (last 3 results) No results for input(s): PROBNP in the last 8760 hours. HbA1C: No results for  input(s): HGBA1C in the last 72 hours. CBG: Recent Labs  Lab 08/31/19 0829  GLUCAP 91  Lipid Profile: No results for input(s): CHOL, HDL, LDLCALC, TRIG, CHOLHDL, LDLDIRECT in the last 72 hours. Thyroid Function Tests: No results for input(s): TSH, T4TOTAL, FREET4, T3FREE, THYROIDAB in the last 72 hours. Anemia Panel: No results for input(s): VITAMINB12, FOLATE, FERRITIN, TIBC, IRON, RETICCTPCT in the last 72 hours.  --------------------------------------------------------------------------------------------------------------- Urine analysis:    Component Value Date/Time   COLORURINE yellow 09/28/2009 1317   APPEARANCEUR Clear 09/28/2009 1317   LABSPEC 1.010 09/28/2009 1317   PHURINE 5.0 09/28/2009 1317   HGBUR 2+ 09/28/2009 1317   BILIRUBINUR negative 03/17/2016 1648   PROTEINUR trace 03/17/2016 1648   UROBILINOGEN 0.2 03/17/2016 1648   UROBILINOGEN 0.2 09/28/2009 1317   NITRITE negative 03/17/2016 1648   NITRITE negative 09/28/2009 1317   LEUKOCYTESUR small (1+) (A) 03/17/2016 1648      Imaging Results:    DG Chest Port 1 View  Result Date: 09/03/2019 CLINICAL DATA:  Increased shortness of breath EXAM: PORTABLE CHEST 1 VIEW COMPARISON:  Earlier today FINDINGS: Cardiomegaly with Kerley lines and cephalized blood flow. Fissural thickening and small effusions. Dialysis catheter on the right and direct defibrillator lead on the left. IMPRESSION: CHF. Electronically Signed   By: Monte Fantasia M.D.   On: 09/03/2019 04:22   DG Chest Port 1 View  Result Date: 09/03/2019 CLINICAL DATA:  Dyspnea EXAM: PORTABLE CHEST 1 VIEW COMPARISON:  08/30/2019 FINDINGS: Lungs are clear. No pneumothorax or pleural effusion. Right internal jugular hemodialysis catheter with its tip at the superior cavoatrial junction and epicardial defibrillator are unchanged. Mild cardiomegaly is stable. Pulmonary vascularity is normal. No acute bone abnormality. IMPRESSION: No acute cardiopulmonary process.  Electronically Signed   By: Fidela Salisbury MD   On: 09/03/2019 00:23    My personal review of EKG: Rhythm sinus tachycardia, Rate 103 /min, QTc 519,no Acute ST changes   Assessment & Plan:    Active Problems: CHF exacerbation    1. Hypertensive crisis/hypertensive emergency 1. At arrival blood pressure was 219/144 2. Patient did not take antihypertensives last night as he has been instructed not to take them the night before dialysis 3. Nitro drip started 4. Current blood pressure 139/53 5. Flash pulmonary edema 6. EKG with no ischemic changes 7. Continue to monitor 2. Acute hypoxic respiratory failure 1. No oxygen requirement at baseline 2. Requiring 5 L nasal cannula to maintain oxygen sats 3. Likely secondary to flash pulmonary edema 4. Continue treatment for CHF 5. Continue dialysis today 6. Wean off O2 as tolerated 7. Continue to monitor 3. CHF exacerbation 1. BNP 1193 2. Chest x-ray shows signs of CHF 3. Last echo April 2018 shows -mild to moderate LVH with mild chamber dilatation and left ventricle ejection fraction of 55%.  Grade 1 diastolic dysfunction.  Mildly calcified mitral annulus with trivial mitral regurgitation.  Device wire present within the right heart. 4. Daily weights 5. Intake and output 6. 40 mg Lasix given in ED 7. No further Lasix ordered as today is scheduled dialysis day 8. Low-sodium diet 9. Continue to monitor 4. ESRD 1. ED provider spoke with nephrology who will dialyze this patient today 2. Creatinine is 13 today, baseline seems to be between 9 and 11 3. Potassium is normal at 4.6 4. Hyponatremia to be managed by nephro as well -appreciate the Rec's 5. Hyponatremia 1. To be managed by nephro dialysis 6. Diabetes mellitus type 2 1. Sliding scale coverage 2. Diabetic diet 3. Hemoglobin A1c 4. Monitor CBGs 7. Acute on chronic anemia 1. No signs or symptoms  of active bleed 2. Chronic anemia secondary to ESRD 3. Hemoglobin has dropped  from 12.7-9.1 and last 3 weeks 4. Patient did have a toe amputation during this time -could be partially contributing 5. FOBT 8. Intermittent fevers 1. With history of recent toe amputation we will get blood cultures 2. No fever documented here 3. Tylenol for possible fever 4. No leukocytosis 5. Monitor closely for signs of infection -start empiric antibiotics if needed 9. Prolonged QT 1. QT is 519 2. Monitor on telemetry 3. Hold QT prolonging agents when possible   DVT Prophylaxis-   Heparin - SCDs   AM Labs Ordered, also please review Full Orders  Family Communication: Admission, patients condition and plan of care including tests being ordered have been discussed with the patient and Wife who indicate understanding and agree with the plan and Code Status.  Code Status:  Full  Admission status: Inpatient :The appropriate admission status for this patient is INPATIENT. Inpatient status is judged to be reasonable and necessary in order to provide the required intensity of service to ensure the patient's safety. The patient's presenting symptoms, physical exam findings, and initial radiographic and laboratory data in the context of their chronic comorbidities is felt to place them at high risk for further clinical deterioration. Furthermore, it is not anticipated that the patient will be medically stable for discharge from the hospital within 2 midnights of admission. The following factors support the admission status of inpatient.     The patient's presenting symptoms include dyspnea The worrisome physical exam findings include Hypertensive crisis The initial radiographic and laboratory data are worrisome because of CXR = flash pulm edema The chronic co-morbidities include ESRD, CHF       * I certify that at the point of admission it is my clinical judgment that the patient will require inpatient hospital care spanning beyond 2 midnights from the point of admission due to high  intensity of service, high risk for further deterioration and high frequency of surveillance required.*  Time spent in minutes : [place]

## 2019-09-03 NOTE — TOC Initial Note (Signed)
Transition of Care [facility]) - Initial/Assessment Note   Patient Details  Name: Anthony Bullock MRN: [id number] Date of Birth: 11-21-1960  [facility] CM/SW Contact:    Sherie Don, LCSW Phone Number: 09/03/2019, 9:48 AM  Clinical Narrative: Patient is a 59 year old male who was admitted for hypertensive emergency. Patient has a history of DM, diabetic peripheral neuropathy, COPD, chronic systolic congestive heart failure, CAD, osteoarthritis of multiple joints, peripheral vascular disease, and ESRD on dialysis. TOC received consult for CHF screening. CSW spoke with wife to complete assessment. Per wife, patient lives at home with her. Patient currently has a walker and nebulizer. Wife reported that due to patient's recent toe amputation, he has difficulty completing ADLs independently and requires some assistance. Wife reported patient is able to afford his medications each month and takes them as prescribed. Patient currently goes to [facility] dialysis in [place] on TThSat.  Per CHF screening, wife reported the patient "sort of" follows a heart healthy diet. Patient currently restricts his salt and fluid intake. Wife reported patient does not take daily weights, but his dry weight is monitored closely at dialysis. TOC to follow for discharge needs.  Expected Discharge Plan: Home/Self Care Barriers to Discharge: Continued Medical Work up  Patient Goals and CMS Choice Patient states their goals for this hospitalization and ongoing recovery are:: Discharge home CMS Medicare.gov Compare Post Acute Care list provided to:: Patient Represenative (must comment) Choice offered to / list presented to : Spouse  Expected Discharge Plan and Services Expected Discharge Plan: Home/Self Care In-house Referral: Clinical Social Work Discharge Planning Services: NA Living arrangements for the past 2 months: Single Family Home  Prior Living Arrangements/Services Living arrangements for the past 2  months: Single Family Home Lives with:: Spouse Patient language and need for interpreter reviewed:: Yes Do you feel safe going back to the place where you live?: Yes      Need for Family Participation in Patient Care: Yes (Comment) Care giver support system in place?: Yes (comment) Current home services: DME [facility], nebulizer) Criminal Activity/Legal Involvement Pertinent to Current Situation/Hospitalization: No - Comment as needed  Permission Sought/Granted Permission sought to share information with : Family Supports Permission granted to share information with : Yes, Verbal Permission Granted Share Information with NAME: Treasure Ingrum Permission granted to share info w Relationship: Wife  Emotional Assessment Appearance:: Appears stated age Orientation: : Oriented to Self, Oriented to Place, Oriented to  Time, Oriented to Situation Alcohol / Substance Use: Not Applicable Psych Involvement: No (comment)  Admission diagnosis:  Hypertensive emergency [I16.1] Patient Active Problem List   Diagnosis Date Noted  . Acute pulmonary edema (HCC)   . Osteomyelitis of fifth toe of left foot ([place])   . Febrile illness 08/17/2019  . SIRS (systemic inflammatory response syndrome) ([place]) 08/17/2019  . Thrombocytopenia ([place]) 08/17/2019  . Pressure injury of skin 08/17/2019  . Hypertensive emergency 07/10/2019  . Onychomycosis 08/16/2016  . Status post unilateral below knee amputation, right ([place]) 04/26/2016  . Bilateral carotid bruits 03/30/2016  . Diabetes mellitus with complication ([place])   . Anemia due to chronic kidney disease   . ESRD (end stage renal disease) on dialysis ([place]) 11/30/2015  . Acute respiratory failure with hypoxia ([place]) 11/29/2015  . At high risk for falls 09/29/2014  . Peripheral vascular disease ([place]) 09/29/2014  . Osteoarthritis of both knees 01/07/2014  . Osteoarthritis of multiple joints 10/06/2013  . NICM (nonischemic cardiomyopathy) ([place]) 06/27/2013  . CAD  (coronary artery  disease) 03/13/2013  . Abnormal nuclear stress test 01/27/2013  . Chronic systolic congestive heart failure ([place]) 01/27/2013  . COPD (chronic obstructive pulmonary disease) ([place]) 12/27/2012  . Diabetic peripheral neuropathy ([place]) 02/10/2012  . URINARY CALCULUS 09/28/2009  . Adjustment disorder with depressed mood 08/21/2009  . Poorly controlled type II diabetes mellitus with renal complication ([place]) 88/87/5797  . Hyperlipidemia 04/14/2008  . Essential hypertension 04/14/2008  . ALLERGIC RHINITIS 04/14/2008   PCP:  [name], MD Pharmacy:   [facility] [address] [place]  [address] [place] [address] Phone: [phone] Fax: [phone]  Readmission Risk Interventions Readmission Risk Prevention Plan 09/03/2019  Transportation Screening Complete  PCP or Specialist Appt within 3-5 Days Not Complete  HRI or Home Care Consult Complete  Social Work Consult for [place] Planning/Counseling Complete  Palliative Care Screening Not Applicable  Medication Review Press photographer) Complete  Some recent data might be hidden

## 2019-09-04 ENCOUNTER — Encounter: Payer: Self-pay | Admitting: Physician Assistant

## 2019-09-04 ENCOUNTER — Telehealth: Payer: Self-pay | Admitting: Family Medicine

## 2019-09-04 ENCOUNTER — Ambulatory Visit (INDEPENDENT_AMBULATORY_CARE_PROVIDER_SITE_OTHER): Payer: Medicare Other | Admitting: Physician Assistant

## 2019-09-04 VITALS — Ht 70.0 in | Wt 202.0 lb

## 2019-09-04 DIAGNOSIS — D638 Anemia in other chronic diseases classified elsewhere: Secondary | ICD-10-CM

## 2019-09-04 DIAGNOSIS — J9601 Acute respiratory failure with hypoxia: Secondary | ICD-10-CM

## 2019-09-04 DIAGNOSIS — M869 Osteomyelitis, unspecified: Secondary | ICD-10-CM

## 2019-09-04 DIAGNOSIS — I5033 Acute on chronic diastolic (congestive) heart failure: Secondary | ICD-10-CM

## 2019-09-04 LAB — PHOSPHORUS: Phosphorus: 6.1 mg/dL — ABNORMAL HIGH (ref 2.5–4.6)

## 2019-09-04 LAB — COMPREHENSIVE METABOLIC PANEL
ALT: 14 U/L (ref 0–44)
AST: 18 U/L (ref 15–41)
Albumin: 2.6 g/dL — ABNORMAL LOW (ref 3.5–5.0)
Alkaline Phosphatase: 50 U/L (ref 38–126)
Anion gap: 13 (ref 5–15)
BUN: 31 mg/dL — ABNORMAL HIGH (ref 6–20)
CO2: 27 mmol/L (ref 22–32)
Calcium: 8.7 mg/dL — ABNORMAL LOW (ref 8.9–10.3)
Chloride: 94 mmol/L — ABNORMAL LOW (ref 98–111)
Creatinine, Ser: 7.89 mg/dL — ABNORMAL HIGH (ref 0.61–1.24)
GFR calc Af Amer: 8 mL/min — ABNORMAL LOW (ref >60)
GFR calc non Af Amer: 7 mL/min — ABNORMAL LOW (ref >60)
Glucose, Bld: 196 mg/dL — ABNORMAL HIGH (ref 70–99)
Potassium: 3.6 mmol/L (ref 3.5–5.1)
Sodium: 134 mmol/L — ABNORMAL LOW (ref 135–145)
Total Bilirubin: 0.6 mg/dL (ref 0.3–1.2)
Total Protein: 6.8 g/dL (ref 6.5–8.1)

## 2019-09-04 LAB — CBC WITH DIFFERENTIAL/PLATELET
Abs Immature Granulocytes: 0.02 10*3/uL (ref 0.00–0.07)
Basophils Absolute: 0 10*3/uL (ref 0.0–0.1)
Basophils Relative: 1 %
Eosinophils Absolute: 0.1 10*3/uL (ref 0.0–0.5)
Eosinophils Relative: 1 %
HCT: 28.8 % — ABNORMAL LOW (ref 39.0–52.0)
Hemoglobin: 8.9 g/dL — ABNORMAL LOW (ref 13.0–17.0)
Immature Granulocytes: 0 %
Lymphocytes Relative: 15 %
Lymphs Abs: 1.1 10*3/uL (ref 0.7–4.0)
MCH: 27.3 pg (ref 26.0–34.0)
MCHC: 30.9 g/dL (ref 30.0–36.0)
MCV: 88.3 fL (ref 80.0–100.0)
Monocytes Absolute: 0.6 10*3/uL (ref 0.1–1.0)
Monocytes Relative: 8 %
Neutro Abs: 5.4 10*3/uL (ref 1.7–7.7)
Neutrophils Relative %: 75 %
Platelets: 209 10*3/uL (ref 150–400)
RBC: 3.26 MIL/uL — ABNORMAL LOW (ref 4.22–5.81)
RDW: 14.6 % (ref 11.5–15.5)
WBC: 7.2 10*3/uL (ref 4.0–10.5)
nRBC: 0 % (ref 0.0–0.2)

## 2019-09-04 LAB — TSH: TSH: 0.751 u[IU]/mL (ref 0.350–4.500)

## 2019-09-04 LAB — GLUCOSE, CAPILLARY
Glucose-Capillary: 129 mg/dL — ABNORMAL HIGH (ref 70–99)
Glucose-Capillary: 67 mg/dL — ABNORMAL LOW (ref 70–99)

## 2019-09-04 LAB — MAGNESIUM: Magnesium: 2.1 mg/dL (ref 1.7–2.4)

## 2019-09-04 MED ORDER — NITROGLYCERIN 0.2 MG/HR TD PT24: 0.2000 mg | MEDICATED_PATCH | Freq: Every day | TRANSDERMAL | 4 refills | Status: DC

## 2019-09-04 MED ORDER — AMLODIPINE BESYLATE 10 MG PO TABS
10.0000 mg | ORAL_TABLET | Freq: Every day | ORAL | 0 refills | Status: DC
Start: 2019-09-04 — End: 2019-10-07

## 2019-09-04 MED ORDER — AMLODIPINE BESYLATE 5 MG PO TABS
10.0000 mg | ORAL_TABLET | Freq: Every day | ORAL | Status: DC
  Administered 2019-09-04: 10 mg via ORAL
  Filled 2019-09-04: qty 2

## 2019-09-04 NOTE — Progress Notes (Signed)
Patient ID: Anthony Bullock, male   DOB: June 26, 1960, 59 y.o.   MRN: [id number] S: Feels well, wants to go home O:BP (!) 155/66   Pulse 80   Temp (!) 97.5 F (36.4 C) (Oral)   Resp 16   Ht 5\' 10"  (1.778 m)   Wt 91.8 kg   SpO2 99%   BMI 29.04 kg/m   Intake/Output Summary (Last 24 hours) at 09/04/2019 1126 Last data filed at 09/04/2019 1000 Gross per 24 hour  Intake 345.1 ml  Output 4000 ml  Net -3654.9 ml   Intake/Output: I/O last 3 completed shifts: In: 345.1 [P.O.:240; I.V.:105.1] Out: 4000 [Other:4000]  Intake/Output this shift:  Total I/O In: 240 [P.O.:240] Out: -  Weight change: -2.416 kg Gen: NAD Ext: s/p RBKA, no edema on left, foot wrapped from 5th toe amputation.  Recent Labs  Lab 08/30/19 1729 09/03/19 0104 09/04/19 0420  NA 132* 129* 134*  K 4.2 4.6 3.6  CL 94* 91* 94*  CO2 25 24 27   GLUCOSE 218* 275* 196*  BUN 34* 60* 31*  CREATININE 9.23* 12.95* 7.89*  ALBUMIN 2.6*  --  2.6*  CALCIUM 8.6* 8.6* 8.7*  PHOS  --   --  6.1*  AST 24  --  18  ALT 15  --  14   Liver Function Tests: Recent Labs  Lab 08/30/19 1729 09/04/19 0420  AST 24 18  ALT 15 14  ALKPHOS 58 50  BILITOT 1.4* 0.6  PROT 7.0 6.8  ALBUMIN 2.6* 2.6*   No results for input(s): LIPASE, AMYLASE in the last 168 hours. No results for input(s): AMMONIA in the last 168 hours. CBC: Recent Labs  Lab 08/30/19 1729 09/03/19 0104 09/04/19 0420  WBC 14.3* 9.1 7.2  NEUTROABS 12.0* 7.4 5.4  HGB 9.6* 9.1* 8.9*  HCT 31.5* 29.4* 28.8*  MCV 90.0 88.6 88.3  PLT 179 207 209   Cardiac Enzymes: No results for input(s): CKTOTAL, CKMB, CKMBINDEX, TROPONINI in the last 168 hours. CBG: Recent Labs  Lab 09/03/19 1202 09/03/19 1333 09/03/19 1616 09/03/19 2230 09/04/19 0743  GLUCAP 321* 331* 194* 234* 129*    Iron Studies: No results for input(s): IRON, TIBC, TRANSFERRIN, FERRITIN in the last 72 hours. Studies/Results: DG Chest Port 1 View  Result Date: 09/03/2019 CLINICAL DATA:  Increased  shortness of breath EXAM: PORTABLE CHEST 1 VIEW COMPARISON:  Earlier today FINDINGS: Cardiomegaly with Kerley lines and cephalized blood flow. Fissural thickening and small effusions. Dialysis catheter on the right and direct defibrillator lead on the left. IMPRESSION: CHF. Electronically Signed   By: Monte Fantasia M.D.   On: 09/03/2019 04:22   DG Chest Port 1 View  Result Date: 09/03/2019 CLINICAL DATA:  Dyspnea EXAM: PORTABLE CHEST 1 VIEW COMPARISON:  08/30/2019 FINDINGS: Lungs are clear. No pneumothorax or pleural effusion. Right internal jugular hemodialysis catheter with its tip at the superior cavoatrial junction and epicardial defibrillator are unchanged. Mild cardiomegaly is stable. Pulmonary vascularity is normal. No acute bone abnormality. IMPRESSION: No acute cardiopulmonary process. Electronically Signed   By: Fidela Salisbury MD   On: 09/03/2019 00:23   ECHOCARDIOGRAM COMPLETE  Result Date: 09/03/2019    ECHOCARDIOGRAM REPORT   Patient Name:   Anthony Bullock [facility] Date of Exam: 09/03/2019 Medical Rec #:  [id number]     Height:       70.0 in Accession #:    [id number]    Weight:       213.0 lb Date of Birth:  1960-07-09  BSA:          2.144 m Patient Age:    71 years      BP:           157/74 mmHg Patient Gender: M             HR:           84 bpm. Exam Location:  [facility] Procedure: 2D Echo, Cardiac Doppler and Color Doppler Indications:    CHF-Acute Diastolic 888.91 / Q94.50  History:        Patient has prior history of Echocardiogram examinations, most                 recent 09/09/2016. CHF and Cardiomyopathy, CAD, COPD; Risk                 Factors:Hypertension, Diabetes and Dyslipidemia. ESRD (end stage                 renal disease) on dialysis, History of cardiac catheterization.  Sonographer:    Alvino Chapel RCS Referring Phys: [id number] ASIA B [place]  1. Left ventricular ejection fraction, by estimation, is 50%. The left ventricle has mildly decreased function. Left  ventricular endocardial border not optimally defined to evaluate regional wall motion. There is moderate left ventricular hypertrophy. Left ventricular diastolic parameters are consistent with Grade I diastolic dysfunction (impaired relaxation).  2. Right ventricular systolic function is normal. The right ventricular size is normal.  3. Left atrial size was mildly dilated.  4. The mitral valve is normal in structure. No evidence of mitral valve regurgitation. No evidence of mitral stenosis.  5. The aortic valve has an indeterminant number of cusps. Aortic valve regurgitation is not visualized. No aortic stenosis is present.  6. The inferior vena cava is normal in size with greater than 50% respiratory variability, suggesting right atrial pressure of 3 mmHg. FINDINGS  Left Ventricle: Left ventricular ejection fraction, by estimation, is 50%. The left ventricle has mildly decreased function. Left ventricular endocardial border not optimally defined to evaluate regional wall motion. The left ventricular internal cavity  size was normal in size. There is moderate left ventricular hypertrophy. Left ventricular diastolic parameters are consistent with Grade I diastolic dysfunction (impaired relaxation). Normal left ventricular filling pressure. Right Ventricle: The right ventricular size is normal. No increase in right ventricular wall thickness. Right ventricular systolic function is normal. Left Atrium: Left atrial size was mildly dilated. Right Atrium: Right atrial size was normal in size. Pericardium: There is no evidence of pericardial effusion. Mitral Valve: The mitral valve is normal in structure. There is mild thickening of the mitral valve leaflet(s). There is mild calcification of the mitral valve leaflet(s). Mild mitral annular calcification. No evidence of mitral valve regurgitation. No evidence of mitral valve stenosis. Tricuspid Valve: The tricuspid valve is normal in structure. Tricuspid valve regurgitation  is not demonstrated. No evidence of tricuspid stenosis. Aortic Valve: The aortic valve has an indeterminant number of cusps. Aortic valve regurgitation is not visualized. No aortic stenosis is present. Aortic valve mean gradient measures 2.5 mmHg. Aortic valve peak gradient measures 4.8 mmHg. Aortic valve area, by VTI measures 2.39 cm. Pulmonic Valve: The pulmonic valve was not well visualized. Pulmonic valve regurgitation is not visualized. No evidence of pulmonic stenosis. Aorta: The aortic root is normal in size and structure. Pulmonary Artery: Indeterminate PASP, inadequate TR jet. Venous: The inferior vena cava is normal in size with greater than 50% respiratory variability, suggesting right  atrial pressure of 3 mmHg. IAS/Shunts: No atrial level shunt detected by color flow Doppler.  LEFT VENTRICLE PLAX 2D LVIDd:         5.77 cm  Diastology LVIDs:         4.07 cm  LV e' lateral:   5.33 cm/s LV PW:         1.38 cm  LV E/e' lateral: 16.4 LV IVS:        1.40 cm  LV e' medial:    3.26 cm/s LVOT diam:     2.10 cm  LV E/e' medial:  26.8 LV SV:         52 LV SV Index:   24 LVOT Area:     3.46 cm  RIGHT VENTRICLE RV S prime:     13.90 cm/s TAPSE (M-mode): 2.6 cm LEFT ATRIUM             Index       RIGHT ATRIUM           Index LA diam:        4.30 cm 2.01 cm/m  RA Area:     18.50 cm LA Vol (A2C):   58.8 ml 27.43 ml/m RA Volume:   53.40 ml  24.91 ml/m LA Vol (A4C):   68.1 ml 31.76 ml/m LA Biplane Vol: 66.1 ml 30.83 ml/m  AORTIC VALVE AV Area (Vmax):    2.45 cm AV Area (Vmean):   2.22 cm AV Area (VTI):     2.39 cm AV Vmax:           109.06 cm/s AV Vmean:          74.244 cm/s AV VTI:            0.217 m AV Peak Grad:      4.8 mmHg AV Mean Grad:      2.5 mmHg LVOT Vmax:         77.00 cm/s LVOT Vmean:        47.600 cm/s LVOT VTI:          0.150 m LVOT/AV VTI ratio: 0.69  AORTA Ao Root diam: 3.20 cm MITRAL VALVE MV Area (PHT): 4.41 cm     SHUNTS MV Decel Time: 172 msec     Systemic VTI:  0.15 m MV E velocity: 87.40  cm/s   Systemic Diam: 2.10 cm MV A velocity: 113.00 cm/s MV E/A ratio:  0.77 Carlyle Dolly MD Electronically signed by Carlyle Dolly MD Signature Date/Time: 09/03/2019/1:38:42 PM    Final    . amLODipine  10 mg Oral Daily  . aspirin EC  81 mg Oral Daily  . atorvastatin  80 mg Oral QHS  . carvedilol  12.5 mg Oral BID WC  . Chlorhexidine Gluconate Cloth  6 each Topical Q0600  . fenofibrate  160 mg Oral Daily  . gabapentin  600 mg Oral BID  . heparin  5,000 Units Subcutaneous Q8H  . insulin aspart  0-15 Units Subcutaneous TID WC  . insulin glargine  50 Units Subcutaneous Q2200  . loratadine  10 mg Oral Daily  . multivitamin  1 tablet Oral QHS  . sodium chloride flush  3 mL Intravenous Q12H    BMET    Component Value Date/Time   NA 134 (L) 09/04/2019 0420   K 3.6 09/04/2019 0420   CL 94 (L) 09/04/2019 0420   CO2 27 09/04/2019 0420   GLUCOSE 196 (H) 09/04/2019 0420   BUN 31 (H) 09/04/2019 1275  BUN 75 (A) 11/07/2017 0000   CREATININE 7.89 (H) 09/04/2019 0420   CALCIUM 8.7 (L) 09/04/2019 0420   CALCIUM 13.6 (HH) 06/04/2015 1300   GFRNONAA 7 (L) 09/04/2019 0420   GFRAA 8 (L) 09/04/2019 0420   CBC    Component Value Date/Time   WBC 7.2 09/04/2019 0420   RBC 3.26 (L) 09/04/2019 0420   HGB 8.9 (L) 09/04/2019 0420   HCT 28.8 (L) 09/04/2019 0420   PLT 209 09/04/2019 0420   MCV 88.3 09/04/2019 0420   MCH 27.3 09/04/2019 0420   MCHC 30.9 09/04/2019 0420   RDW 14.6 09/04/2019 0420   LYMPHSABS 1.1 09/04/2019 0420   MONOABS 0.6 09/04/2019 0420   EOSABS 0.1 09/04/2019 0420   BASOSABS 0.0 09/04/2019 0420    Dialysis Orders: Center: [facility]  on TTS . EDW 92,5kg HD Bath 2K/2.5Ca  Time 225 minutes Heparin  1900. Access RIJ TDC (has been in place for 2 years) BFR 400 DFR 600    Hectoral 3.5 mcg IV/HD Epogen 3,000 Units IV/HD  Venofer  50 mg IV q week   Assessment/Plan: 1.  SOB- due to volume overload.  Improved with 4 liters UF and HD yesterday 2. Recurring fevers of  unknown origin- has been going on for several weeks per his wife and get worse after dialysis.  Concerning for catheter related bacteremia.  Blood cultures were taken from HD catheter 09/03/19 and recommend having his HD catheter removed and replaced  3.  ESRD -  Cont with HD qTTS 4.  Hypertension/volume  -  Overloaded due to shortened treatment on Saturday.  UF as tolerated 5.  Anemia  -  ESA 6.  Metabolic bone disease -  Cont with home meds 7.  Nutrition -  Renal diet, carb modified. 8. Disposition- stable for discharge to home and follow up with his outpatient HD tomorrow as scheduled.   Donetta Potts, MD [employer] [phone]

## 2019-09-04 NOTE — Discharge Summary (Signed)
Physician Discharge Summary  Anthony Bullock FMB:[id number] DOB: 12-30-1960 DOA: 09/02/2019  PCP: [name], MD  Admit date: 09/02/2019 Discharge date: 09/04/2019  Admitted From: Home Disposition: Home  Recommendations for Outpatient Follow-up:  1. Follow up with PCP 2. Outpatient follow up with dialysis as scheduled 3. Follow up in ED if symptoms worsen or new appear   Home Health: No Equipment/Devices: None  Discharge Condition: Stable CODE STATUS: Full Diet recommendation: Heart healthy/carb modified/renal hemodialysis diet  Brief/Interim Summary: 59 year old with history of osteomyelitis, nonischemic cardiomyopathy, HTN, HLD, ESRD on HD, diabetic nephropathy, DM2, COPD, AICD presented to the ER with shortness of breath. In the ED he had signs of volume overload, hypoxia, tachycardia, elevated blood pressure and creatinine. Nephrology was consulted for hemodialysis. He was started on Nitroglycerin drip for elevated BP. Required supplemental oxygen on admission. During the hospitalization, his symptoms improved. He tolerated HD. He is currently on room air. Nephrology has cleared the patient for discharge. He will be discharged home today.   Discharge Diagnoses:   Acute hypoxic respiratory failure: Due to volume overload: Improved Hypertensive emergency/hypertensive crisis Acute on chronic diastolic heart failure End-stage renal disease on hemodialysis -Patient presented with signs of volume overload and hypoxia requiring supplemental oxygen. He was treated with nitroglycerin drip and received hemodialysis during the hospitalization as per nephrology. His symptoms have improved. He is currently on room air. He wants to go home. Nephrology has cleared the patient for discharge. He needs to follow-up with outpatient hemodialysis which is scheduled for tomorrow. Blood pressure still on the higher side. Will also add amlodipine along with home Coreg. Outpatient follow-up with PCP  and nephrology along with cardiology  Hyponatremia -From renal failure. Sodium stable. Being managed by dialysis  Anemia of chronic disease -From renal failure. Hemoglobin stable  Intermittent fevers -Patient was on Fortaz and vancomycin recently for intermittent fevers. Blood cultures negative so far. Will DC antibiotics. Nephrology is agreeable with the plan. Currently afebrile  Diabetes mellitus type 2 with hyperglycemia -Carb modified diet. Continue home regimen. Outpatient follow-up  History of recent left toe amputation -Outpatient follow-up with Dr. Sharol Given    Discharge Instructions  Discharge Instructions    Diet - low sodium heart healthy   Complete by: As directed    Diet - low sodium heart healthy   Complete by: As directed    Diet Carb Modified   Complete by: As directed    Increase activity slowly   Complete by: As directed    Increase activity slowly   Complete by: As directed    No wound care   Complete by: As directed    No wound care   Complete by: As directed      Allergies as of 09/04/2019      Reactions   Epoetin Alfa    Ferumoxytol    Morphine Sulfate Rash, Other (See Comments)   Itches all over, red spots      Medication List    STOP taking these medications   cefTAZidime  IVPB Commonly known as: FORTAZ   vancomycin  IVPB     TAKE these medications   albuterol 108 (90 Base) MCG/ACT inhaler Commonly known as: VENTOLIN HFA Inhale 2 puffs into the lungs every 4 (four) hours as needed for wheezing or shortness of breath.   amLODipine 10 MG tablet Commonly known as: NORVASC Take 1 tablet (10 mg total) by mouth daily.   aspirin EC 81 MG tablet Take 81 mg by mouth daily.  atorvastatin 80 MG tablet Commonly known as: LIPITOR Take 1 tablet (80 mg total) by mouth at bedtime.   azelastine 0.1 % nasal spray Commonly known as: Astelin Place 2 sprays into both nostrils 3 (three) times daily as needed for rhinitis. Use in each nostril as  directed   Basaglar KwikPen 100 UNIT/ML INJECT 70 UNITS SUBCUTANEOUSLY AT BEDTIME   BD Pen Needle Nano U/F 32G X 4 MM Misc Generic drug: Insulin Pen Needle USE 1 PEN NEEDLE SUBCUTANEOUSLY WITH INSULIN 4 TIMES DAILY   carvedilol 12.5 MG tablet Commonly known as: COREG Take 1 tablet (12.5 mg total) by mouth 2 (two) times daily with a meal.   fenofibrate 160 MG tablet Take 1 tablet (160 mg total) by mouth daily.   fexofenadine 180 MG tablet Commonly known as: ALLEGRA Take 180 mg by mouth daily.   FreeStyle Libre 14 Day Sensor Misc USE AS DIRECTED EVERY 14 DAYS   furosemide 40 MG tablet Commonly known as: LASIX Take 3 tablets by mouth once daily   gabapentin 300 MG capsule Commonly known as: NEURONTIN Take 2 capsules (600 mg total) by mouth 2 (two) times daily.   glucose blood test strip Check 1 time daily. E11.9 One Touch Ultra Blue Test Strips   insulin lispro 200 UNIT/ML KwikPen Commonly known as: HumaLOG KwikPen Inject 10 Units into the skin 3 (three) times daily with meals. Use tid with meals as per sliding scale   lanthanum 1000 MG chewable tablet Commonly known as: FOSRENOL Chew 1,000 mg by mouth 3 (three) times daily with meals. Take one tablet after each meal.   multivitamin Tabs tablet Take 1 tablet by mouth daily.   oxyCODONE-acetaminophen 5-325 MG tablet Commonly known as: Percocet Take 1 tablet by mouth every 4 (four) hours as needed.   Pataday 0.2 % Soln Generic drug: Olopatadine HCl Place 1 drop into both eyes daily as needed (for allergies).       Follow-up Information    Burchette, Alinda Sierras, MD. Schedule an appointment as soon as possible for a visit in 1 week(s).   Specialty: Family Medicine Contact information: [place] [address] [phone]        Arnoldo Lenis, MD .   Specialty: Cardiology Contact information: [place] [address] [phone]        Deboraha Sprang, MD  .   Specialty: Cardiology Contact information: [phone] [address] [place] [address] [phone]        outpatient HD Follow up.   Why: keep scheduled appointment             Allergies  Allergen Reactions  . Epoetin Alfa   . Ferumoxytol   . Morphine Sulfate Rash and Other (See Comments)    Itches all over, red spots    Consultations:  Nephrology   Procedures/Studies: DG Chest 2 View  Result Date: 08/30/2019 CLINICAL DATA:  Weakness EXAM: CHEST - 2 VIEW COMPARISON:  08/18/2019, 07/01/2019 FINDINGS: Small bilateral pleural effusions, left greater than right. Right-sided central venous catheter tip over the distal SVC. Similar subcutaneous deferred relate ower lead placement. Borderline cardiomegaly. No consolidation or pneumothorax. IMPRESSION: Small bilateral pleural effusions, left greater than right. Electronically Signed   By: Donavan Foil M.D.   On: 08/30/2019 20:07   DG Chest Port 1 View  Result Date: 09/03/2019 CLINICAL DATA:  Increased shortness of breath EXAM: PORTABLE CHEST 1 VIEW COMPARISON:  Earlier today FINDINGS: Cardiomegaly  with [employer] and cephalized blood flow. Fissural thickening and small effusions. Dialysis catheter on the right and direct defibrillator lead on the left. IMPRESSION: CHF. Electronically Signed   By: Monte Fantasia M.D.   On: 09/03/2019 04:22   DG Chest Port 1 View  Result Date: 09/03/2019 CLINICAL DATA:  Dyspnea EXAM: PORTABLE CHEST 1 VIEW COMPARISON:  08/30/2019 FINDINGS: Lungs are clear. No pneumothorax or pleural effusion. Right internal jugular hemodialysis catheter with its tip at the superior cavoatrial junction and epicardial defibrillator are unchanged. Mild cardiomegaly is stable. Pulmonary vascularity is normal. No acute bone abnormality. IMPRESSION: No acute cardiopulmonary process. Electronically Signed   By: Fidela Salisbury MD   On: 09/03/2019 00:23   DG Chest Port 1 View  Addendum Date: 08/18/2019    ADDENDUM REPORT: 08/18/2019 09:43 ADDENDUM: Corrected report: FINDINGS: Patient has RIGHT-sided dialysis catheter, tip overlying the UPPER RIGHT atrium. Stable appearance of subcutaneous defibrillator lead. Electronically Signed   By: Nolon Nations M.D.   On: 08/18/2019 09:43   Result Date: 08/18/2019 CLINICAL DATA:  Dialysis for possible sepsis. EXAM: PORTABLE CHEST 1 VIEW COMPARISON:  07/01/2019 FINDINGS: Patient has RIGHT-sided dialysis catheter, tip overlying the UPPER RIGHT atrium. Stable appearance of subcutaneous subdural later bleeding. Heart size is mildly enlarged and stable in configuration. Minimal subsegmental atelectasis at the LEFT lung base. There has been significant improvement in the appearance of lungs since prior study. IMPRESSION: 1. Stable cardiomegaly. 2. Improved aeration. Electronically Signed: By: Nolon Nations M.D. On: 08/17/2019 13:14   DG CHEST PORT 1 VIEW  Result Date: 08/18/2019 CLINICAL DATA:  Fever.  Dialysis patient. EXAM: PORTABLE CHEST 1 VIEW COMPARISON:  08/17/2019 FINDINGS: RIGHT-sided dialysis catheter tip overlies the superior vena cava. Subcutaneous defibrillator lead is stable in appearance accounting for differences in position. Heart is enlarged and stable in configuration. There is minimal subsegmental atelectasis at the LEFT lung base. No new consolidations. IMPRESSION: 1. Stable cardiomegaly. 2. No new consolidations. Electronically Signed   By: Nolon Nations M.D.   On: 08/18/2019 09:42   DG Foot Complete Left  Result Date: 08/31/2019 CLINICAL DATA:  Fever.  Recent 5th toe amputation. EXAM: LEFT FOOT - COMPLETE 3+ VIEW COMPARISON:  08/20/2019 radiographs FINDINGS: Transmetatarsal amputation of the 5th digit is noted. Small bony fragments at the amputation sites are noted. No definite evidence of acute osteomyelitis noted. A small plantar calcaneal spur and diffuse vascular calcifications are again noted. No subluxation present. IMPRESSION: 5th  transmetatarsal amputation without definite evidence of acute osteomyelitis. Electronically Signed   By: Margarette Canada M.D.   On: 08/31/2019 11:24   ECHOCARDIOGRAM COMPLETE  Result Date: 09/03/2019    ECHOCARDIOGRAM REPORT   Patient Name:   [facility] Date of Exam: 09/03/2019 Medical Rec #:  [id number]     Height:       70.0 in Accession #:    [id number]    Weight:       213.0 lb Date of Birth:  [birth year]     BSA:          2.144 m Patient Age:    89 years      BP:           157/74 mmHg Patient Gender: M             HR:           84 bpm. Exam Location:  [facility] Procedure: 2D Echo, Cardiac Doppler and Color Doppler Indications:    CHF-Acute Diastolic  428.31 / I50.31  History:        Patient has prior history of Echocardiogram examinations, most                 recent 09/09/2016. CHF and Cardiomyopathy, CAD, COPD; Risk                 Factors:Hypertension, Diabetes and Dyslipidemia. ESRD (end stage                 renal disease) on dialysis, History of cardiac catheterization.  Sonographer:    Alvino Chapel RCS Referring Phys: [id number] ASIA B [place]  1. Left ventricular ejection fraction, by estimation, is 50%. The left ventricle has mildly decreased function. Left ventricular endocardial border not optimally defined to evaluate regional wall motion. There is moderate left ventricular hypertrophy. Left ventricular diastolic parameters are consistent with Grade I diastolic dysfunction (impaired relaxation).  2. Right ventricular systolic function is normal. The right ventricular size is normal.  3. Left atrial size was mildly dilated.  4. The mitral valve is normal in structure. No evidence of mitral valve regurgitation. No evidence of mitral stenosis.  5. The aortic valve has an indeterminant number of cusps. Aortic valve regurgitation is not visualized. No aortic stenosis is present.  6. The inferior vena cava is normal in size with greater than 50% respiratory variability, suggesting right  atrial pressure of 3 mmHg. FINDINGS  Left Ventricle: Left ventricular ejection fraction, by estimation, is 50%. The left ventricle has mildly decreased function. Left ventricular endocardial border not optimally defined to evaluate regional wall motion. The left ventricular internal cavity  size was normal in size. There is moderate left ventricular hypertrophy. Left ventricular diastolic parameters are consistent with Grade I diastolic dysfunction (impaired relaxation). Normal left ventricular filling pressure. Right Ventricle: The right ventricular size is normal. No increase in right ventricular wall thickness. Right ventricular systolic function is normal. Left Atrium: Left atrial size was mildly dilated. Right Atrium: Right atrial size was normal in size. Pericardium: There is no evidence of pericardial effusion. Mitral Valve: The mitral valve is normal in structure. There is mild thickening of the mitral valve leaflet(s). There is mild calcification of the mitral valve leaflet(s). Mild mitral annular calcification. No evidence of mitral valve regurgitation. No evidence of mitral valve stenosis. Tricuspid Valve: The tricuspid valve is normal in structure. Tricuspid valve regurgitation is not demonstrated. No evidence of tricuspid stenosis. Aortic Valve: The aortic valve has an indeterminant number of cusps. Aortic valve regurgitation is not visualized. No aortic stenosis is present. Aortic valve mean gradient measures 2.5 mmHg. Aortic valve peak gradient measures 4.8 mmHg. Aortic valve area, by VTI measures 2.39 cm. Pulmonic Valve: The pulmonic valve was not well visualized. Pulmonic valve regurgitation is not visualized. No evidence of pulmonic stenosis. Aorta: The aortic root is normal in size and structure. Pulmonary Artery: Indeterminate PASP, inadequate TR jet. Venous: The inferior vena cava is normal in size with greater than 50% respiratory variability, suggesting right atrial pressure of 3 mmHg.  IAS/Shunts: No atrial level shunt detected by color flow Doppler.  LEFT VENTRICLE PLAX 2D LVIDd:         5.77 cm  Diastology LVIDs:         4.07 cm  LV e' lateral:   5.33 cm/s LV PW:         1.38 cm  LV E/e' lateral: 16.4 LV IVS:        1.40 cm  LV e' medial:  3.26 cm/s LVOT diam:     2.10 cm  LV E/e' medial:  26.8 LV SV:         52 LV SV Index:   24 LVOT Area:     3.46 cm  RIGHT VENTRICLE RV S prime:     13.90 cm/s TAPSE (M-mode): 2.6 cm LEFT ATRIUM             Index       RIGHT ATRIUM           Index LA diam:        4.30 cm 2.01 cm/m  RA Area:     18.50 cm LA Vol (A2C):   58.8 ml 27.43 ml/m RA Volume:   53.40 ml  24.91 ml/m LA Vol (A4C):   68.1 ml 31.76 ml/m LA Biplane Vol: 66.1 ml 30.83 ml/m  AORTIC VALVE AV Area (Vmax):    2.45 cm AV Area (Vmean):   2.22 cm AV Area (VTI):     2.39 cm AV Vmax:           109.06 cm/s AV Vmean:          74.244 cm/s AV VTI:            0.217 m AV Peak Grad:      4.8 mmHg AV Mean Grad:      2.5 mmHg LVOT Vmax:         77.00 cm/s LVOT Vmean:        47.600 cm/s LVOT VTI:          0.150 m LVOT/AV VTI ratio: 0.69  AORTA Ao Root diam: 3.20 cm MITRAL VALVE MV Area (PHT): 4.41 cm     SHUNTS MV Decel Time: 172 msec     Systemic VTI:  0.15 m MV E velocity: 87.40 cm/s   Systemic Diam: 2.10 cm MV A velocity: 113.00 cm/s MV E/A ratio:  0.77 Carlyle Dolly MD Electronically signed by Carlyle Dolly MD Signature Date/Time: 09/03/2019/1:38:42 PM    Final    XR Foot Complete Left  Result Date: 08/20/2019 Three-view radiographs of the left foot shows extensive calcification of the arteries out to the toes with air in the soft tissue around the fifth metatarsal head there are no destructive bony changes to the fifth metatarsal head.      Subjective: Patient seen and examined at bedside. He feels much better and wants to go home. Denies overnight fever, nausea or vomiting. His breathing is much improved.  Discharge Exam: Vitals:   09/04/19 0500 09/04/19 0850  BP: (!) 179/65    Pulse: 88   Resp: 13   Temp:  (!) 97.5 F (36.4 C)  SpO2: 100%     General: Pt is alert, awake, not in acute distress Cardiovascular: rate controlled, S1/S2 + Respiratory: bilateral decreased breath sounds at bases with some scattered crackles Abdominal: Soft, NT, ND, bowel sounds + Extremities: Right BKA present.   The results of significant diagnostics from this hospitalization (including imaging, microbiology, ancillary and laboratory) are listed below for reference.     Microbiology: Recent Results (from the past 240 hour(s))  SARS Coronavirus 2 by RT PCR (hospital order, performed in [facility] hospital lab) Nasopharyngeal Nasopharyngeal Swab     Status: None   Collection Time: 08/30/19  5:23 PM   Specimen: Nasopharyngeal Swab  Result Value Ref Range Status   SARS Coronavirus 2 NEGATIVE NEGATIVE Final    Comment: (NOTE) SARS-CoV-2 target nucleic acids are NOT DETECTED.  The SARS-CoV-2 RNA is generally detectable in upper and lower respiratory specimens during the acute phase of infection. The lowest concentration of SARS-CoV-2 viral copies this assay can detect is 250 copies / mL. A negative result does not preclude SARS-CoV-2 infection and should not be used as the sole basis for treatment or other patient management decisions.  A negative result may occur with improper specimen collection / handling, submission of specimen other than nasopharyngeal swab, presence of viral mutation(s) within the areas targeted by this assay, and inadequate number of viral copies (<250 copies / mL). A negative result must be combined with clinical observations, patient history, and epidemiological information.  Fact Sheet for Patients:   StrictlyIdeas.no  Fact Sheet for Healthcare Providers: BankingDealers.co.za  This test is not yet approved or  cleared by the [place] FDA and has been authorized for detection and/or diagnosis  of SARS-CoV-2 by FDA under an Emergency Use Authorization (EUA).  This EUA will remain in effect (meaning this test can be used) for the duration of the COVID-19 declaration under Section 564(b)(1) of the Act, 21 U.S.C. section 360bbb-3(b)(1), unless the authorization is terminated or revoked sooner.  Performed at [facility], [place] [address], [place], [place] [address]   Blood culture (routine x 2)     Status: None (Preliminary result)   Collection Time: 08/31/19 11:44 AM   Specimen: BLOOD RIGHT HAND  Result Value Ref Range Status   Specimen Description BLOOD RIGHT HAND  Final   Special Requests   Final    BOTTLES DRAWN AEROBIC AND ANAEROBIC Blood Culture results may not be optimal due to an inadequate volume of blood received in culture bottles   Culture   Final    NO GROWTH 4 DAYS Performed at [facility], [place] [address] [place], [place] [address]    Report Status PENDING  Incomplete  Blood culture (routine x 2)     Status: None (Preliminary result)   Collection Time: 08/31/19 11:47 AM   Specimen: BLOOD RIGHT WRIST  Result Value Ref Range Status   Specimen Description BLOOD RIGHT WRIST  Final   Special Requests   Final    BOTTLES DRAWN AEROBIC AND ANAEROBIC Blood Culture results may not be optimal due to an inadequate volume of blood received in culture bottles   Culture   Final    NO GROWTH 4 DAYS Performed at [facility], [place] [address] [place], [place] [address]    Report Status PENDING  Incomplete  SARS Coronavirus 2 by RT PCR (hospital order, performed in [facility] hospital lab) Nasopharyngeal Nasopharyngeal Swab     Status: None   Collection Time: 09/02/19 11:27 PM   Specimen: Nasopharyngeal Swab  Result Value Ref Range Status   SARS Coronavirus 2 NEGATIVE NEGATIVE Final    Comment: (NOTE) SARS-CoV-2 target nucleic acids are NOT DETECTED.  The SARS-CoV-2 RNA is generally detectable in upper and lower respiratory specimens during the acute  phase of infection. The lowest concentration of SARS-CoV-2 viral copies this assay can detect is 250 copies / mL. A negative result does not preclude SARS-CoV-2 infection and should not be used as the sole basis for treatment or other patient management decisions.  A negative result may occur with improper specimen collection / handling, submission of specimen other than nasopharyngeal swab, presence of viral mutation(s) within the areas targeted by this assay, and inadequate number of viral copies (<250 copies / mL). A negative result must be combined  with clinical observations, patient history, and epidemiological information.  Fact Sheet for Patients:   StrictlyIdeas.no  Fact Sheet for Healthcare Providers: BankingDealers.co.za  This test is not yet approved or  cleared by the [place] FDA and has been authorized for detection and/or diagnosis of SARS-CoV-2 by FDA under an Emergency Use Authorization (EUA).  This EUA will remain in effect (meaning this test can be used) for the duration of the COVID-19 declaration under Section 564(b)(1) of the Act, 21 U.S.C. section 360bbb-3(b)(1), unless the authorization is terminated or revoked sooner.  Performed at [facility], [address] [place], [place] [address]   Culture, blood (routine x 2)     Status: None (Preliminary result)   Collection Time: 09/03/19  7:54 AM   Specimen: BLOOD  Result Value Ref Range Status   Specimen Description BLOOD RIGHT ANTECUBITAL  Final   Special Requests   Final    BOTTLES DRAWN AEROBIC AND ANAEROBIC Blood Culture adequate volume   Culture   Final    NO GROWTH < 24 HOURS Performed at [facility], [address] [place], [place] [address]    Report Status PENDING  Incomplete  Culture, blood (routine x 2)     Status: None (Preliminary result)   Collection Time: 09/03/19  7:55 AM   Specimen: BLOOD  Result Value Ref Range Status   Specimen  Description BLOOD RIGHT WRIST  Final   Special Requests   Final    BOTTLES DRAWN AEROBIC AND ANAEROBIC Blood Culture adequate volume   Culture   Final    NO GROWTH < 24 HOURS Performed at [facility], [address] [place], [place] [address]    Report Status PENDING  Incomplete  MRSA PCR Screening     Status: None   Collection Time: 09/03/19  1:52 PM   Specimen: Nasal Mucosa; Nasopharyngeal  Result Value Ref Range Status   MRSA by PCR NEGATIVE NEGATIVE Final    Comment:        The GeneXpert MRSA Assay (FDA approved for NASAL specimens only), is one component of a comprehensive MRSA colonization surveillance program. It is not intended to diagnose MRSA infection nor to guide or monitor treatment for MRSA infections. Performed at [facility], [address] [place], [place] [address]   Culture, blood (routine x 2)     Status: None (Preliminary result)   Collection Time: 09/03/19  4:41 PM   Specimen: BLOOD  Result Value Ref Range Status   Specimen Description BLOOD VENOUS LINE  Final   Special Requests   Final    BOTTLES DRAWN AEROBIC AND ANAEROBIC Blood Culture adequate volume   Culture   Final    NO GROWTH < 24 HOURS Performed at [facility], [address] [place], [place] [address]    Report Status PENDING  Incomplete  Culture, blood (routine x 2)     Status: None (Preliminary result)   Collection Time: 09/03/19  4:42 PM   Specimen: BLOOD  Result Value Ref Range Status   Specimen Description BLOOD ARTERIAL LINE  Final   Special Requests   Final    BOTTLES DRAWN AEROBIC AND ANAEROBIC Blood Culture adequate volume   Culture   Final    NO GROWTH < 24 HOURS Performed at [facility], [address], [place],  [address]    Report Status PENDING  Incomplete     Labs: BNP (last 3 results) Recent Labs    07/01/19 1006 09/03/19 0104  BNP 441.0* 1,193.0*  Basic Metabolic Panel: Recent Labs  Lab 08/30/19 1729 09/03/19 0104 09/04/19 0420  NA 132* 129*  134*  K 4.2 4.6 3.6  CL 94* 91* 94*  CO2 25 24 27   GLUCOSE 218* 275* 196*  BUN 34* 60* 31*  CREATININE 9.23* 12.95* 7.89*  CALCIUM 8.6* 8.6* 8.7*  MG  --   --  2.1  PHOS  --   --  6.1*   Liver Function Tests: Recent Labs  Lab 08/30/19 1729 09/04/19 0420  AST 24 18  ALT 15 14  ALKPHOS 58 50  BILITOT 1.4* 0.6  PROT 7.0 6.8  ALBUMIN 2.6* 2.6*   No results for input(s): LIPASE, AMYLASE in the last 168 hours. No results for input(s): AMMONIA in the last 168 hours. CBC: Recent Labs  Lab 08/30/19 1729 09/03/19 0104 09/04/19 0420  WBC 14.3* 9.1 7.2  NEUTROABS 12.0* 7.4 5.4  HGB 9.6* 9.1* 8.9*  HCT 31.5* 29.4* 28.8*  MCV 90.0 88.6 88.3  PLT 179 207 209   Cardiac Enzymes: No results for input(s): CKTOTAL, CKMB, CKMBINDEX, TROPONINI in the last 168 hours. BNP: Invalid input(s): POCBNP CBG: Recent Labs  Lab 09/03/19 1202 09/03/19 1333 09/03/19 1616 09/03/19 2230 09/04/19 0743  GLUCAP 321* 331* 194* 234* 129*   D-Dimer No results for input(s): DDIMER in the last 72 hours. Hgb A1c No results for input(s): HGBA1C in the last 72 hours. Lipid Profile No results for input(s): CHOL, HDL, LDLCALC, TRIG, CHOLHDL, LDLDIRECT in the last 72 hours. Thyroid function studies Recent Labs    09/04/19 0420  TSH 0.751   Anemia work up No results for input(s): VITAMINB12, FOLATE, FERRITIN, TIBC, IRON, RETICCTPCT in the last 72 hours. Urinalysis    Component Value Date/Time   COLORURINE yellow 09/28/2009 1317   APPEARANCEUR Clear 09/28/2009 1317   LABSPEC 1.010 09/28/2009 1317   PHURINE 5.0 09/28/2009 1317   HGBUR 2+ 09/28/2009 1317   BILIRUBINUR negative 03/17/2016 1648   PROTEINUR trace 03/17/2016 1648   UROBILINOGEN 0.2 03/17/2016 1648   UROBILINOGEN 0.2 09/28/2009 1317   NITRITE negative 03/17/2016 1648   NITRITE negative 09/28/2009 1317   LEUKOCYTESUR small (1+) (A) 03/17/2016 1648   Sepsis Labs Invalid input(s): PROCALCITONIN,  WBC,   LACTICIDVEN Microbiology Recent Results (from the past 240 hour(s))  SARS Coronavirus 2 by RT PCR (hospital order, performed in [place] hospital lab) Nasopharyngeal Nasopharyngeal Swab     Status: None   Collection Time: 08/30/19  5:23 PM   Specimen: Nasopharyngeal Swab  Result Value Ref Range Status   SARS Coronavirus 2 NEGATIVE NEGATIVE Final    Comment: (NOTE) SARS-CoV-2 target nucleic acids are NOT DETECTED.  The SARS-CoV-2 RNA is generally detectable in upper and lower respiratory specimens during the acute phase of infection. The lowest concentration of SARS-CoV-2 viral copies this assay can detect is 250 copies / mL. A negative result does not preclude SARS-CoV-2 infection and should not be used as the sole basis for treatment or other patient management decisions.  A negative result may occur with improper specimen collection / handling, submission of specimen other than nasopharyngeal swab, presence of viral mutation(s) within the areas targeted by this assay, and inadequate number of viral copies (<250 copies / mL). A negative result must be combined with clinical observations, patient history, and epidemiological information.  Fact Sheet for Patients:   StrictlyIdeas.no  Fact Sheet for Healthcare Providers: BankingDealers.co.za  This test is not yet approved or  cleared by the [place] FDA and has  been authorized for detection and/or diagnosis of SARS-CoV-2 by FDA under an Emergency Use Authorization (EUA).  This EUA will remain in effect (meaning this test can be used) for the duration of the COVID-19 declaration under Section 564(b)(1) of the Act, 21 U.S.C. section 360bbb-3(b)(1), unless the authorization is terminated or revoked sooner.  Performed at [facility], [place] [address], [place], [place] [address]   Blood culture (routine x 2)     Status: None (Preliminary result)   Collection Time: 08/31/19  11:44 AM   Specimen: BLOOD RIGHT HAND  Result Value Ref Range Status   Specimen Description BLOOD RIGHT HAND  Final   Special Requests   Final    BOTTLES DRAWN AEROBIC AND ANAEROBIC Blood Culture results may not be optimal due to an inadequate volume of blood received in culture bottles   Culture   Final    NO GROWTH 4 DAYS Performed at [facility], [place] [address], [place], [place] [address]    Report Status PENDING  Incomplete  Blood culture (routine x 2)     Status: None (Preliminary result)   Collection Time: 08/31/19 11:47 AM   Specimen: BLOOD RIGHT WRIST  Result Value Ref Range Status   Specimen Description BLOOD RIGHT WRIST  Final   Special Requests   Final    BOTTLES DRAWN AEROBIC AND ANAEROBIC Blood Culture results may not be optimal due to an inadequate volume of blood received in culture bottles   Culture   Final    NO GROWTH 4 DAYS Performed at [facility], [place] [address], [place], [place] [address]    Report Status PENDING  Incomplete  SARS Coronavirus 2 by RT PCR (hospital order, performed in [facility] hospital lab) Nasopharyngeal Nasopharyngeal Swab     Status: None   Collection Time: 09/02/19 11:27 PM   Specimen: Nasopharyngeal Swab  Result Value Ref Range Status   SARS Coronavirus 2 NEGATIVE NEGATIVE Final    Comment: (NOTE) SARS-CoV-2 target nucleic acids are NOT DETECTED.  The SARS-CoV-2 RNA is generally detectable in upper and lower respiratory specimens during the acute phase of infection. The lowest concentration of SARS-CoV-2 viral copies this assay can detect is 250 copies / mL. A negative result does not preclude SARS-CoV-2 infection and should not be used as the sole basis for treatment or other patient management decisions.  A negative result may occur with improper specimen collection / handling, submission of specimen other than nasopharyngeal swab, presence of viral mutation(s) within the areas targeted by this assay, and inadequate  number of viral copies (<250 copies / mL). A negative result must be combined with clinical observations, patient history, and epidemiological information.  Fact Sheet for Patients:   StrictlyIdeas.no  Fact Sheet for Healthcare Providers: BankingDealers.co.za  This test is not yet approved or  cleared by the [place] FDA and has been authorized for detection and/or diagnosis of SARS-CoV-2 by FDA under an Emergency Use Authorization (EUA).  This EUA will remain in effect (meaning this test can be used) for the duration of the COVID-19 declaration under Section 564(b)(1) of the Act, 21 U.S.C. section 360bbb-3(b)(1), unless the authorization is terminated or revoked sooner.  Performed at [facility], [address] [place], [place] [address]   Culture, blood (routine x 2)     Status: None (Preliminary result)   Collection Time: 09/03/19  7:54 AM   Specimen: BLOOD  Result Value Ref Range Status   Specimen Description BLOOD  RIGHT ANTECUBITAL  Final   Special Requests   Final    BOTTLES DRAWN AEROBIC AND ANAEROBIC Blood Culture adequate volume   Culture   Final    NO GROWTH < 24 HOURS Performed at [facility], [address] [place], [place] [address]    Report Status PENDING  Incomplete  Culture, blood (routine x 2)     Status: None (Preliminary result)   Collection Time: 09/03/19  7:55 AM   Specimen: BLOOD  Result Value Ref Range Status   Specimen Description BLOOD RIGHT WRIST  Final   Special Requests   Final    BOTTLES DRAWN AEROBIC AND ANAEROBIC Blood Culture adequate volume   Culture   Final    NO GROWTH < 24 HOURS Performed at [facility], [address] [place], [place] [address]    Report Status PENDING  Incomplete  MRSA PCR Screening     Status: None   Collection Time: 09/03/19  1:52 PM   Specimen: Nasal Mucosa; Nasopharyngeal  Result Value Ref Range Status   MRSA by PCR NEGATIVE NEGATIVE Final    Comment:         The GeneXpert MRSA Assay (FDA approved for NASAL specimens only), is one component of a comprehensive MRSA colonization surveillance program. It is not intended to diagnose MRSA infection nor to guide or monitor treatment for MRSA infections. Performed at [facility], [address] [place], [place] [address]   Culture, blood (routine x 2)     Status: None (Preliminary result)   Collection Time: 09/03/19  4:41 PM   Specimen: BLOOD  Result Value Ref Range Status   Specimen Description BLOOD VENOUS LINE  Final   Special Requests   Final    BOTTLES DRAWN AEROBIC AND ANAEROBIC Blood Culture adequate volume   Culture   Final    NO GROWTH < 24 HOURS Performed at [facility], [address], [place], [place] [address]    Report Status PENDING  Incomplete  Culture, blood (routine x 2)     Status: None (Preliminary result)   Collection Time: 09/03/19  4:42 PM   Specimen: BLOOD  Result Value Ref Range Status   Specimen Description BLOOD ARTERIAL LINE  Final   Special Requests   Final    BOTTLES DRAWN AEROBIC AND ANAEROBIC Blood Culture adequate volume   Culture   Final    NO GROWTH < 24 HOURS Performed at [facility], [address] [place], [place] [address]    Report Status PENDING  Incomplete     Time coordinating discharge: 35 minutes  SIGNED:   Aline August, MD  [facility] 09/04/2019, 10:30 AM

## 2019-09-04 NOTE — Progress Notes (Signed)
Office Visit Note   Patient: Anthony Bullock           Date of Birth: Jun 08, 1960           MRN: [id number] Visit Date: 09/04/2019              Requested by: [name], MD [place],  [place] [address] PCP: [name], MD  Chief Complaint  Patient presents with  . Left Foot - Routine Post Op    08/21/19 left foot 5th ray amputation       HPI: This is a pleasant 59 year old gentleman who is 2 weeks status post left foot fifth ray amputation.  He was just recently used from the hospital today after being admitted for fluid overload.  He is feeling much better.  Assessment & Plan: Visit Diagnoses: No diagnosis found.  Plan: I will order a nitroglycerin patch to help with his microcirculation.  Follow-up in 1 week.  Follow-Up Instructions: No follow-ups on file.   Ortho Exam  Patient is alert, oriented, no adenopathy, well-dressed, normal affect, normal respiratory effort. Focused examination of his foot demonstrates apposed wound edges.  Minimal drainage no swelling he does have some distal necrosis which is not full-thickness.  No foul odor or cellulitis.  Dorsalis pedis pulses intact.  Imaging: No results found. No images are attached to the encounter.  Labs: Lab Results  Component Value Date   HGBA1C 8.1 (H) 07/10/2019   HGBA1C 8.9 (H) 08/06/2018   HGBA1C 11.6 02/15/2018   ESRSEDRATE 94 (H) 03/30/2016   ESRSEDRATE 18 11/30/2012   CRP 16.1 (H) 03/30/2016   REPTSTATUS PENDING 09/03/2019   GRAMSTAIN  03/31/2016    RARE WBC PRESENT, PREDOMINANTLY PMN NO ORGANISMS SEEN    CULT  09/03/2019    NO GROWTH < 24 HOURS Performed at [facility], [address] [place], [place] [address]    [place] 03/31/2016   LABORGA ENTEROBACTER SPECIES 03/31/2016     Lab Results  Component Value Date   ALBUMIN 2.6 (L) 09/04/2019   ALBUMIN 2.6 (L) 08/30/2019   ALBUMIN 2.9 (L) 08/18/2019    Lab Results  Component  Value Date   MG 2.1 09/04/2019   MG 1.7 08/18/2019   MG 2.1 12/03/2014   No results found for: VD25OH  No results found for: PREALBUMIN CBC EXTENDED Latest Ref Rng & Units 09/04/2019 09/03/2019 08/30/2019  WBC 4.0 - 10.5 K/uL 7.2 9.1 14.3(H)  RBC 4.22 - 5.81 MIL/uL 3.26(L) 3.32(L) 3.50(L)  HGB 13.0 - 17.0 g/dL 8.9(L) 9.1(L) 9.6(L)  HCT 39 - 52 % 28.8(L) 29.4(L) 31.5(L)  PLT 150 - 400 K/uL 209 207 179  NEUTROABS 1.7 - 7.7 K/uL 5.4 7.4 12.0(H)  LYMPHSABS 0.7 - 4.0 K/uL 1.1 0.8 0.8     Body mass index is 28.98 kg/m.  Orders:  No orders of the defined types were placed in this encounter.  No orders of the defined types were placed in this encounter.    Procedures: No procedures performed  Clinical Data: No additional findings.  ROS:  All other systems negative, except as noted in the HPI. Review of Systems  Objective: Vital Signs: Ht _0  (1.778 m)   Wt 202 lb (91.6 kg)   BMI 28.98 kg/m   Specialty Comments:  No specialty comments available.  PMFS History: Patient Active Problem List   Diagnosis Date Noted  . Acute pulmonary edema (HCC)   . Osteomyelitis of fifth toe of  left foot ([place])   . Febrile illness 08/17/2019  . SIRS (systemic inflammatory response syndrome) ([place]) 08/17/2019  . Thrombocytopenia ([place]) 08/17/2019  . Pressure injury of skin 08/17/2019  . Hypertensive emergency 07/10/2019  . Onychomycosis 08/16/2016  . Status post unilateral below knee amputation, right ([place]) 04/26/2016  . Bilateral carotid bruits 03/30/2016  . Diabetes mellitus with complication ([place])   . Anemia due to chronic kidney disease   . ESRD (end stage renal disease) on dialysis ([place]) 11/30/2015  . Acute respiratory failure with hypoxia ([place]) 11/29/2015  . At high risk for falls 09/29/2014  . Peripheral vascular disease ([place]) 09/29/2014  . Osteoarthritis of both knees 01/07/2014  . Osteoarthritis of multiple joints 10/06/2013  . NICM (nonischemic cardiomyopathy) ([place])  06/27/2013  . CAD (coronary artery disease) 03/13/2013  . Abnormal nuclear stress test 01/27/2013  . Chronic systolic congestive heart failure ([place]) 01/27/2013  . COPD (chronic obstructive pulmonary disease) ([place]) 12/27/2012  . Diabetic peripheral neuropathy ([place]) 02/10/2012  . URINARY CALCULUS 09/28/2009  . Adjustment disorder with depressed mood 08/21/2009  . Poorly controlled type II diabetes mellitus with renal complication ([place]) 16/10/9602  . Hyperlipidemia 04/14/2008  . Essential hypertension 04/14/2008  . ALLERGIC RHINITIS 04/14/2008   Past Medical History:  Diagnosis Date  . 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 Allergic rhinitis   . Anemia   . Arthritis   . Chronic systolic heart failure ([place])    a. ECHO (12/2012) EF 25-30%, HK entireanteroseptal myocardium //  b.  EF 25%, diffuse HK, grade 1 diastolic dysfunction, MAC, mild LAE, normal RVSF, trivial pericardial effusion  . COPD (chronic obstructive pulmonary disease) ([place])   . Diabetes mellitus type II   . Diabetic nephropathy ([place])   . Diabetic neuropathy ([place])   . ESRD on hemodialysis [facility])    started HD June 2017, goes to [facility] HD unit, Dr Hinda Lenis  . History of cardiac catheterization    a.Myoview 1/15:  There is significant left ventricular dysfunction. There may be slight scar at the apex. There is no significant ischemia. LV Ejection Fraction: 27%  //  b. RHC/LHC (1/15) with mean RA 6, PA 47/22 mean 33, mean PCWP 20, PVR 2.5 WU, CI 2.5; 80% dLAD stenosis, 70% diffuse large D.   . History of kidney stones   . Hyperlipidemia   . Hypertension   . Kidney stones   . NICM (nonischemic cardiomyopathy) ([place])    Primarily nonischemic. Echo (12/14) with EF 25-30%. Echo (3/15) with EF 25%, mild to moderately dilated LV, normal RV size and systolic function.   . Osteomyelitis ([place])    left fifth ray  . Pneumonia   . Urethral stricture   . Wears glasses     Family History    Problem Relation Age of Onset  . Bladder Cancer Mother   . Alcohol abuse Father   . Melanoma Father   . Stroke Maternal Grandmother   . Heart Problems Maternal Grandmother        unknown  . Diabetes Maternal Grandmother   . Heart disease Maternal Grandfather   . Prostate cancer Maternal Grandfather     Past Surgical History:  Procedure Laterality Date  . ABDOMINAL AORTOGRAM W/LOWER EXTREMITY N/A 03/30/2016   Procedure: Abdominal Aortogram w/Lower Extremity;  Surgeon: Angelia Mould, MD;  Location: [place] [facility];  Service: Cardiovascular;  Laterality: N/A;  . AMPUTATION Right 04/26/2016   Procedure: Right Below Knee Amputation;  Surgeon: Newt Minion,  MD;  Location: [place];  Service: Orthopedics;  Laterality: Right;  . AMPUTATION Left 08/21/2019   Procedure: LEFT FOOT 5TH RAY AMPUTATION;  Surgeon: Newt Minion, MD;  Location: [place];  Service: Orthopedics;  Laterality: Left;  . AV FISTULA PLACEMENT Right 09/08/2015   Procedure: INSERTION OF 4-62m x 45cm  ARTERIOVENOUS (AV) GORE-TEX GRAFT RIGHT UPPER  ARM;  Surgeon: CAngelia Mould MD;  [place]  Service: Vascular;  Laterality: Right;  . AV FISTULA PLACEMENT Left 01/14/2016   Procedure: CREATION OF LEFT UPPER ARM ARTERIOVENOUS FISTULA;  Surgeon: CAngelia Mould MD;  [place]  Service: Vascular;  Laterality: Left;  . BASCILIC VEIN TRANSPOSITION Right 08/22/2014   Procedure: RIGHT UPPER ARM BASCILIC VEIN TRANSPOSITION;  Surgeon: CAngelia Mould MD;  [place] [facility]  Service: Vascular;  Laterality: Right;  . BELOW KNEE LEG AMPUTATION Right 04/26/2016  . CARDIAC CATHETERIZATION    . CARDIAC DEFIBRILLATOR PLACEMENT  06/27/2013   Sub Q       BY DR KCaryl Comes . CATARACT EXTRACTION W/PHACO Right 08/06/2018   Procedure: CATARACT EXTRACTION PHACO AND INTRAOCULAR LENS PLACEMENT (IOC);  Surgeon: WBaruch Goldmann MD;  Location: [facility];  Service: Ophthalmology;  Laterality: Right;  CDE: 4.06  . CATARACT  EXTRACTION W/PHACO Left 08/20/2018   Procedure: CATARACT EXTRACTION PHACO AND INTRAOCULAR LENS PLACEMENT (IOC);  Surgeon: WBaruch Goldmann MD;  Location: [facility];  Service: Ophthalmology;  Laterality: Left;  CDE: 6.76  . COLONOSCOPY WITH PROPOFOL N/A 07/22/2015   Procedure: COLONOSCOPY WITH PROPOFOL;  Surgeon: HDoran Stabler MD;  [facility];  Service: Gastroenterology;  Laterality: N/A;  . FEMORAL-POPLITEAL BYPASS GRAFT Right 03/31/2016   Procedure: BYPASS GRAFT FEMORAL-POPLITEAL ARTERY USING RIGHT GREATER SAPHENOUS NONREVERSED VEIN;  Surgeon: CAngelia Mould MD;  [place]: [facility]  Service: Vascular;  Laterality: Right;  . HERNIA REPAIR    . I & D EXTREMITY Right 03/31/2016   Procedure: IRRIGATION AND DEBRIDEMENT FOOT;  Surgeon: CAngelia Mould MD;  Location: [place]  Service: Vascular;  Laterality: Right;  . IMPLANTABLE CARDIOVERTER DEFIBRILLATOR IMPLANT N/A 06/27/2013   Procedure: SUB Q ICD;  Surgeon: SDeboraha Sprang MD;  [facility];  Service: Cardiovascular;  Laterality: N/A;  . INTRAOPERATIVE ARTERIOGRAM Right 03/31/2016   Procedure: INTRA OPERATIVE ARTERIOGRAM;  Surgeon: CAngelia Mould MD;  Location: [facility]  Service: Vascular;  Laterality: Right;  . IR GENERIC HISTORICAL Right 11/30/2015   IR THROMBECTOMY AV FISTULA W/THROMBOLYSIS/PTA INC/SHUNT/IMG RIGHT 11/30/2015 GAletta Edouard MD [facility]  . IR GENERIC HISTORICAL  11/30/2015   IR [place] ACCESS RIGHT 11/30/2015 GAletta Edouard MD [facility]  . IR GENERIC HISTORICAL Right 12/15/2015   IR THROMBECTOMY AV FISTULA W/THROMBOLYSIS/PTA/STENT INC/SHUNT/IMG RT 12/15/2015 DArne Cleveland MD [facility]  . IR GENERIC HISTORICAL  12/15/2015   IR [place] VASC ACCESS RIGHT 12/15/2015 DArne Cleveland MD [facility]  . IR GENERIC HISTORICAL  12/28/2015   IR FLUORO GUIDE CV LINE RIGHT 12/28/2015 AMarybelle Killings MD [facility]  . IR GENERIC HISTORICAL  12/28/2015   IR [place] VASC ACCESS RIGHT  12/28/2015 AMarybelle Killings MD [facility]  . LEFT A ND RIGHT HEART CATH  01/30/2013   DR BSung Amabile . LEFT AND RIGHT HEART CATHETERIZATION WITH CORONARY ANGIOGRAM N/A 01/30/2013   Procedure: LEFT AND RIGHT HEART CATHETERIZATION WITH CORONARY ANGIOGRAM;  Surgeon: DJolaine Artist MD;  [facility];  Service: Cardiovascular;  Laterality: N/A;  . PERIPHERAL  VASCULAR CATHETERIZATION Right 01/26/2015   Procedure: A/V Fistulagram;  Surgeon: Angelia Mould, MD;  Location: [place] [facility];  Service: Cardiovascular;  Laterality: Right;  . reapea urethral surgery for recurrent obstruction  2011  . TOTAL KNEE ARTHROPLASTY Right 2007  . VEIN HARVEST Right 03/31/2016   Procedure: RIGHT GREATER SAPHENOUS VEIN HARVEST;  Surgeon: Angelia Mould, MD;  Location: [place];  Service: Vascular;  Laterality: Right;   Social History   Occupational History  . Not on file  Tobacco Use  . Smoking status: Former Smoker    Packs/day: 2.00    Years: 32.00    Pack years: 64.00    Types: Cigarettes    Quit date: 05/11/2010    Years since quitting: 9.3  . Smokeless tobacco: Never Used  Vaping Use  . Vaping Use: Never used  Substance and Sexual Activity  . Alcohol use: No  . Drug use: No  . Sexual activity: Yes

## 2019-09-04 NOTE — Telephone Encounter (Signed)
Transition Care Management Follow-up Telephone Call Date of discharge and from where: 09/04/2019 from ICU at [facility]  How have you been since you were released from the hospital? Has pain where toe was recently amputated  Any questions or concerns? Yes, has concerns of BP medications  Items Reviewed: Did the pt receive and understand the discharge instructions provided? Yes  Medications obtained and verified? Yes  Any new allergies since your discharge? No  Dietary orders reviewed? Yes Do you have support at home? Yes , has wife in the home  Functional Questionnaire: (I = Independent and D = Dependent) ADLs: I  Bathing/Dressing- I  Meal Prep-   Eating- I  Maintaining continence- I  Transferring/Ambulation- I  Managing Meds- I  Follow up appointments reviewed:  PCP Hospital f/u appt confirmed? Patient would like to call back and schedule as he states he has other appointments but he will call us to get scheduled Specialist Hospital f/u appt confirmed? No   Are transportation arrangements needed? No  If their condition worsens, is the pt aware to call PCP or go to the Emergency Dept.? Yes Was the patient provided with contact information for the PCP's office or ED? Yes Was to pt encouraged to call back with questions or concerns? Yes

## 2019-09-05 DIAGNOSIS — N2581 Secondary hyperparathyroidism of renal origin: Secondary | ICD-10-CM | POA: Diagnosis not present

## 2019-09-05 DIAGNOSIS — R509 Fever, unspecified: Secondary | ICD-10-CM | POA: Diagnosis not present

## 2019-09-05 DIAGNOSIS — D631 Anemia in chronic kidney disease: Secondary | ICD-10-CM | POA: Diagnosis not present

## 2019-09-05 DIAGNOSIS — Z992 Dependence on renal dialysis: Secondary | ICD-10-CM | POA: Diagnosis not present

## 2019-09-05 DIAGNOSIS — N186 End stage renal disease: Secondary | ICD-10-CM | POA: Diagnosis not present

## 2019-09-05 DIAGNOSIS — D509 Iron deficiency anemia, unspecified: Secondary | ICD-10-CM | POA: Diagnosis not present

## 2019-09-05 LAB — CULTURE, BLOOD (ROUTINE X 2)
Culture: NO GROWTH
Culture: NO GROWTH

## 2019-09-06 ENCOUNTER — Telehealth: Payer: Self-pay | Admitting: Physician Assistant

## 2019-09-06 ENCOUNTER — Other Ambulatory Visit: Payer: Self-pay

## 2019-09-06 DIAGNOSIS — D631 Anemia in chronic kidney disease: Secondary | ICD-10-CM | POA: Diagnosis not present

## 2019-09-06 DIAGNOSIS — D509 Iron deficiency anemia, unspecified: Secondary | ICD-10-CM | POA: Diagnosis not present

## 2019-09-06 DIAGNOSIS — N2581 Secondary hyperparathyroidism of renal origin: Secondary | ICD-10-CM | POA: Diagnosis not present

## 2019-09-06 DIAGNOSIS — R509 Fever, unspecified: Secondary | ICD-10-CM | POA: Diagnosis not present

## 2019-09-06 DIAGNOSIS — N186 End stage renal disease: Secondary | ICD-10-CM | POA: Diagnosis not present

## 2019-09-06 DIAGNOSIS — Z992 Dependence on renal dialysis: Secondary | ICD-10-CM | POA: Diagnosis not present

## 2019-09-06 MED ORDER — NITROGLYCERIN 0.2 MG/HR TD PT24: 0.2000 mg | MEDICATED_PATCH | Freq: Every day | TRANSDERMAL | 4 refills | Status: DC

## 2019-09-06 NOTE — Telephone Encounter (Signed)
Patient called needing a Rx sent to the pharmacy for the Nitro patches. The number to contact patient is [phone]

## 2019-09-06 NOTE — Telephone Encounter (Signed)
Ok per MD to sent refill to pharm for nitro patch.

## 2019-09-06 NOTE — Telephone Encounter (Signed)
I called and lm on vm for the pt's wife to advise that the paperwork that was faxed to the number on the forms I was given. I have faxed the forms from last Friday to the number provided in message below. Also the paperwork was for an out of work time from 08/21/19 through 10/21/19 so we would not need to extend the time out of work unless we were going to surpass that date. It is easier to send someone back to work than it is to extend out of work time so if she does not need the full amount of time off we can make that adjustment. To call with any questions.

## 2019-09-06 NOTE — Telephone Encounter (Signed)
Patient's wife Manuela Schwartz called asked if the FMLA paperwork con be extended until next 09/13/2019. The paperwork was faxed to the wrong department. The  Correct fax number is [phone]   The number to contact Manuela Schwartz is [phone]

## 2019-09-08 LAB — CULTURE, BLOOD (ROUTINE X 2)
Culture: NO GROWTH
Culture: NO GROWTH
Culture: NO GROWTH
Culture: NO GROWTH
Special Requests: ADEQUATE
Special Requests: ADEQUATE
Special Requests: ADEQUATE
Special Requests: ADEQUATE

## 2019-09-09 ENCOUNTER — Other Ambulatory Visit: Payer: Self-pay

## 2019-09-09 DIAGNOSIS — D509 Iron deficiency anemia, unspecified: Secondary | ICD-10-CM | POA: Diagnosis not present

## 2019-09-09 DIAGNOSIS — N2581 Secondary hyperparathyroidism of renal origin: Secondary | ICD-10-CM | POA: Diagnosis not present

## 2019-09-09 DIAGNOSIS — Z992 Dependence on renal dialysis: Secondary | ICD-10-CM | POA: Diagnosis not present

## 2019-09-09 DIAGNOSIS — R509 Fever, unspecified: Secondary | ICD-10-CM | POA: Diagnosis not present

## 2019-09-09 DIAGNOSIS — N186 End stage renal disease: Secondary | ICD-10-CM | POA: Diagnosis not present

## 2019-09-09 DIAGNOSIS — D631 Anemia in chronic kidney disease: Secondary | ICD-10-CM | POA: Diagnosis not present

## 2019-09-09 NOTE — Patient Outreach (Signed)
[facility]) Care Management  09/09/2019  Anthony Bullock 1960/12/31 [id number]   Red emmi : Date of call: 09/06/2019 Reason for alert:   Unfilled RX--yes  Placed call to patient with no answer. Left a message requesting a call back.   PLAN: Will mail an unsuccessful outreach letter and attempt again in 3 days.  Tomasa Rand, RN, BSN, CEN [facility] [employer] [phone]

## 2019-09-10 DIAGNOSIS — N2581 Secondary hyperparathyroidism of renal origin: Secondary | ICD-10-CM | POA: Diagnosis not present

## 2019-09-10 DIAGNOSIS — Z992 Dependence on renal dialysis: Secondary | ICD-10-CM | POA: Diagnosis not present

## 2019-09-10 DIAGNOSIS — D631 Anemia in chronic kidney disease: Secondary | ICD-10-CM | POA: Diagnosis not present

## 2019-09-10 DIAGNOSIS — D509 Iron deficiency anemia, unspecified: Secondary | ICD-10-CM | POA: Diagnosis not present

## 2019-09-10 DIAGNOSIS — R509 Fever, unspecified: Secondary | ICD-10-CM | POA: Diagnosis not present

## 2019-09-10 DIAGNOSIS — N186 End stage renal disease: Secondary | ICD-10-CM | POA: Diagnosis not present

## 2019-09-11 ENCOUNTER — Encounter: Payer: Self-pay | Admitting: Physician Assistant

## 2019-09-11 ENCOUNTER — Other Ambulatory Visit: Payer: Self-pay

## 2019-09-11 ENCOUNTER — Ambulatory Visit (INDEPENDENT_AMBULATORY_CARE_PROVIDER_SITE_OTHER): Payer: Medicare Other | Admitting: Physician Assistant

## 2019-09-11 VITALS — Ht 70.0 in | Wt 202.0 lb

## 2019-09-11 DIAGNOSIS — M869 Osteomyelitis, unspecified: Secondary | ICD-10-CM

## 2019-09-11 NOTE — Progress Notes (Signed)
Office Visit Note   Patient: Anthony Bullock           Date of Birth: 01-10-1961           MRN: [id number] Visit Date: 09/11/2019              Requested by: [name], MD [place],  [place] [id number] PCP: [name], MD  Chief Complaint  Patient presents with  . Left Foot - Routine Post Op    08/21/19 left foot 5th ray amputation       HPI: Patient is 3 weeks status post fifth ray amputation on the left.  He is overall doing well he is using his nitroglycerin patch in different parts of his foot.  Denies any significant pain.  Assessment & Plan: Visit Diagnoses: No diagnosis found.  Plan: Continue offload weightbearing and the nitroglycerin patch I have seen some improvement.  Follow-up in a week  Follow-Up Instructions: No follow-ups on file.   Ortho Exam  Patient is alert, oriented, no adenopathy, well-dressed, normal affect, normal respiratory effort. Focused examination demonstrates healing incision proximally incision is just about healed with well opposed wound edges.  Sutures are in place.  More distally he does have some eschar and some wound dehiscence but there is no drainage no foul odor no surrounding cellulitis no fluctuance swelling is minimal  Imaging: No results found. No images are attached to the encounter.  Labs: Lab Results  Component Value Date   HGBA1C 8.1 (H) 07/10/2019   HGBA1C 8.9 (H) 08/06/2018   HGBA1C 11.6 02/15/2018   ESRSEDRATE 94 (H) 03/30/2016   ESRSEDRATE 18 11/30/2012   CRP 16.1 (H) 03/30/2016   REPTSTATUS 09/08/2019 FINAL 09/03/2019   GRAMSTAIN  03/31/2016    RARE WBC PRESENT, PREDOMINANTLY PMN NO ORGANISMS SEEN    CULT  09/03/2019    NO GROWTH 5 DAYS Performed at [facility], [address] [place], [place] [address]    [place] 03/31/2016   LABORGA ENTEROBACTER SPECIES 03/31/2016     Lab Results  Component Value Date   ALBUMIN 2.6 (L) 09/04/2019   ALBUMIN  2.6 (L) 08/30/2019   ALBUMIN 2.9 (L) 08/18/2019    Lab Results  Component Value Date   MG 2.1 09/04/2019   MG 1.7 08/18/2019   MG 2.1 12/03/2014   No results found for: VD25OH  No results found for: PREALBUMIN CBC EXTENDED Latest Ref Rng & Units 09/04/2019 09/03/2019 08/30/2019  WBC 4.0 - 10.5 K/uL 7.2 9.1 14.3(H)  RBC 4.22 - 5.81 MIL/uL 3.26(L) 3.32(L) 3.50(L)  HGB 13.0 - 17.0 g/dL 8.9(L) 9.1(L) 9.6(L)  HCT 39 - 52 % 28.8(L) 29.4(L) 31.5(L)  PLT 150 - 400 K/uL 209 207 179  NEUTROABS 1.7 - 7.7 K/uL 5.4 7.4 12.0(H)  LYMPHSABS 0.7 - 4.0 K/uL 1.1 0.8 0.8     Body mass index is 28.98 kg/m.  Orders:  No orders of the defined types were placed in this encounter.  No orders of the defined types were placed in this encounter.    Procedures: No procedures performed  Clinical Data: No additional findings.  ROS:  All other systems negative, except as noted in the HPI. Review of Systems  Objective: Vital Signs: Ht _0  (1.778 m)   Wt 202 lb (91.6 kg)   BMI 28.98 kg/m   Specialty Comments:  No specialty comments available.  PMFS History: Patient Active Problem List   Diagnosis Date Noted  . Acute  pulmonary edema ([place])   . Osteomyelitis of fifth toe of left foot ([place])   . Febrile illness 08/17/2019  . SIRS (systemic inflammatory response syndrome) ([place]) 08/17/2019  . Thrombocytopenia ([place]) 08/17/2019  . Pressure injury of skin 08/17/2019  . Hypertensive emergency 07/10/2019  . Onychomycosis 08/16/2016  . Status post unilateral below knee amputation, right ([place]) 04/26/2016  . Bilateral carotid bruits 03/30/2016  . Diabetes mellitus with complication ([place])   . Anemia due to chronic kidney disease   . ESRD (end stage renal disease) on dialysis ([place]) 11/30/2015  . Acute respiratory failure with hypoxia ([place]) 11/29/2015  . At high risk for falls 09/29/2014  . Peripheral vascular disease ([place]) 09/29/2014  . Osteoarthritis of both knees 01/07/2014  . Osteoarthritis  of multiple joints 10/06/2013  . NICM (nonischemic cardiomyopathy) ([place]) 06/27/2013  . CAD (coronary artery disease) 03/13/2013  . Abnormal nuclear stress test 01/27/2013  . Chronic systolic congestive heart failure ([place]) 01/27/2013  . COPD (chronic obstructive pulmonary disease) ([place]) 12/27/2012  . Diabetic peripheral neuropathy ([place]) 02/10/2012  . URINARY CALCULUS 09/28/2009  . Adjustment disorder with depressed mood 08/21/2009  . Poorly controlled type II diabetes mellitus with renal complication ([place]) 43/32/9518  . Hyperlipidemia 04/14/2008  . Essential hypertension 04/14/2008  . ALLERGIC RHINITIS 04/14/2008   Past Medical History:  Diagnosis Date  . 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 Allergic rhinitis   . Anemia   . Arthritis   . Chronic systolic heart failure ([place])    a. ECHO (12/2012) EF 25-30%, HK entireanteroseptal myocardium //  b.  EF 25%, diffuse HK, grade 1 diastolic dysfunction, MAC, mild LAE, normal RVSF, trivial pericardial effusion  . COPD (chronic obstructive pulmonary disease) ([place])   . Diabetes mellitus type II   . Diabetic nephropathy ([place])   . Diabetic neuropathy ([place])   . ESRD on hemodialysis [facility])    started HD June 2017, goes to [facility] HD unit, Dr Hinda Lenis  . History of cardiac catheterization    a.Myoview 1/15:  There is significant left ventricular dysfunction. There may be slight scar at the apex. There is no significant ischemia. LV Ejection Fraction: 27%  //  b. RHC/LHC (1/15) with mean RA 6, PA 47/22 mean 33, mean PCWP 20, PVR 2.5 WU, CI 2.5; 80% dLAD stenosis, 70% diffuse large D.   . History of kidney stones   . Hyperlipidemia   . Hypertension   . Kidney stones   . NICM (nonischemic cardiomyopathy) ([place])    Primarily nonischemic. Echo (12/14) with EF 25-30%. Echo (3/15) with EF 25%, mild to moderately dilated LV, normal RV size and systolic function.   . Osteomyelitis ([place])    left fifth ray  .  Pneumonia   . Urethral stricture   . Wears glasses     Family History  Problem Relation Age of Onset  . Bladder Cancer Mother   . Alcohol abuse Father   . Melanoma Father   . Stroke Maternal Grandmother   . Heart Problems Maternal Grandmother        unknown  . Diabetes Maternal Grandmother   . Heart disease Maternal Grandfather   . Prostate cancer Maternal Grandfather     Past Surgical History:  Procedure Laterality Date  . ABDOMINAL AORTOGRAM W/LOWER EXTREMITY N/A 03/30/2016   Procedure: Abdominal Aortogram w/Lower Extremity;  Surgeon: Angelia Mould, MD;  Location: [place] CV LAB;  Service: Cardiovascular;  Laterality: N/A;  . AMPUTATION Right 04/26/2016  Procedure: Right Below Knee Amputation;  Surgeon: Newt Minion, MD;  Location: [place];  Service: Orthopedics;  Laterality: Right;  . AMPUTATION Left 08/21/2019   Procedure: LEFT FOOT 5TH RAY AMPUTATION;  Surgeon: Newt Minion, MD;  Location: [place];  Service: Orthopedics;  Laterality: Left;  . AV FISTULA PLACEMENT Right 09/08/2015   Procedure: INSERTION OF 4-40m x 45cm  ARTERIOVENOUS (AV) GORE-TEX GRAFT RIGHT UPPER  ARM;  Surgeon: CAngelia Mould MD;  [place]  Service: Vascular;  Laterality: Right;  . AV FISTULA PLACEMENT Left 01/14/2016   Procedure: CREATION OF LEFT UPPER ARM ARTERIOVENOUS FISTULA;  Surgeon: CAngelia Mould MD;  Location: [place]  Service: Vascular;  Laterality: Left;  . BASCILIC VEIN TRANSPOSITION Right 08/22/2014   Procedure: RIGHT UPPER ARM BASCILIC VEIN TRANSPOSITION;  Surgeon: CAngelia Mould MD;  Location: [place]  Service: Vascular;  Laterality: Right;  . BELOW KNEE LEG AMPUTATION Right 04/26/2016  . CARDIAC CATHETERIZATION    . CARDIAC DEFIBRILLATOR PLACEMENT  06/27/2013   Sub Q       BY DR KCaryl Comes . CATARACT EXTRACTION W/PHACO Right 08/06/2018   Procedure: CATARACT EXTRACTION PHACO AND INTRAOCULAR LENS PLACEMENT (IOC);  Surgeon: WBaruch Goldmann MD;  [facility];   Service: Ophthalmology;  Laterality: Right;  CDE: 4.06  . CATARACT EXTRACTION W/PHACO Left 08/20/2018   Procedure: CATARACT EXTRACTION PHACO AND INTRAOCULAR LENS PLACEMENT (IOC);  Surgeon: WBaruch Goldmann MD;  Location: [facility];  Service: Ophthalmology;  Laterality: Left;  CDE: 6.76  . COLONOSCOPY WITH PROPOFOL N/A 07/22/2015   Procedure: COLONOSCOPY WITH PROPOFOL;  Surgeon: HDoran Stabler MD;  [facility];  Service: Gastroenterology;  Laterality: N/A;  . FEMORAL-POPLITEAL BYPASS GRAFT Right 03/31/2016   Procedure: BYPASS GRAFT FEMORAL-POPLITEAL ARTERY USING RIGHT GREATER SAPHENOUS NONREVERSED VEIN;  Surgeon: CAngelia Mould MD;  Location: [place]  Service: Vascular;  Laterality: Right;  . HERNIA REPAIR    . I & D EXTREMITY Right 03/31/2016   Procedure: IRRIGATION AND DEBRIDEMENT FOOT;  Surgeon: CAngelia Mould MD;  Location: [place]  Service: Vascular;  Laterality: Right;  . IMPLANTABLE CARDIOVERTER DEFIBRILLATOR IMPLANT N/A 06/27/2013   Procedure: SUB Q ICD;  Surgeon: SDeboraha Sprang MD;  [facility];  Service: Cardiovascular;  Laterality: N/A;  . INTRAOPERATIVE ARTERIOGRAM Right 03/31/2016   Procedure: INTRA OPERATIVE ARTERIOGRAM;  Surgeon: CAngelia Mould MD;  Location: [place]  Service: Vascular;  Laterality: Right;  . IR GENERIC HISTORICAL Right 11/30/2015   IR THROMBECTOMY AV FISTULA W/THROMBOLYSIS/PTA INC/SHUNT/IMG RIGHT 11/30/2015 GAletta Edouard MD [facility]  . IR GENERIC HISTORICAL  11/30/2015   IR [place] VASC ACCESS RIGHT 11/30/2015 GAletta Edouard MD [facility]  . IR GENERIC HISTORICAL Right 12/15/2015   IR THROMBECTOMY AV FISTULA W/THROMBOLYSIS/PTA/STENT INC/SHUNT/IMG RT 12/15/2015 DArne Cleveland MD [facility]  . IR GENERIC HISTORICAL  12/15/2015   IR [place]IDE VASC ACCESS RIGHT 12/15/2015 DArne Cleveland MD [facility]  . IR GENERIC HISTORICAL  12/28/2015   IR FLUORO GUIDE CV LINE RIGHT 12/28/2015 AMarybelle Killings MD [facility]  . IR  GENERIC HISTORICAL  12/28/2015   IR [place]IDE VASC ACCESS RIGHT 12/28/2015 AMarybelle Killings MD [facility]  . LEFT A ND RIGHT HEART CATH  01/30/2013   DR BSung Amabile . LEFT AND RIGHT HEART CATHETERIZATION WITH CORONARY ANGIOGRAM N/A 01/30/2013   Procedure: LEFT AND RIGHT HEART CATHETERIZATION WITH CORONARY ANGIOGRAM;  Surgeon: DJolaine Artist MD;  [facility];  Service: Cardiovascular;  Laterality: N/A;  . PERIPHERAL VASCULAR CATHETERIZATION Right 01/26/2015   Procedure: A/V Fistulagram;  Surgeon: Angelia Mould, MD;  Location: [place] CV LAB;  Service: Cardiovascular;  Laterality: Right;  . reapea urethral surgery for recurrent obstruction  2011  . TOTAL KNEE ARTHROPLASTY Right 2007  . VEIN HARVEST Right 03/31/2016   Procedure: RIGHT GREATER SAPHENOUS VEIN HARVEST;  Surgeon: Angelia Mould, MD;  Location: [place];  Service: Vascular;  Laterality: Right;   Social History   Occupational History  . Not on file  Tobacco Use  . Smoking status: Former Smoker    Packs/day: 2.00    Years: 32.00    Pack years: 64.00    Types: Cigarettes    Quit date: 05/11/2010    Years since quitting: 9.3  . Smokeless tobacco: Never Used  Vaping Use  . Vaping Use: Never used  Substance and Sexual Activity  . Alcohol use: No  . Drug use: No  . Sexual activity: Yes

## 2019-09-12 ENCOUNTER — Other Ambulatory Visit: Payer: Self-pay

## 2019-09-12 DIAGNOSIS — D509 Iron deficiency anemia, unspecified: Secondary | ICD-10-CM | POA: Diagnosis not present

## 2019-09-12 DIAGNOSIS — Z992 Dependence on renal dialysis: Secondary | ICD-10-CM | POA: Diagnosis not present

## 2019-09-12 DIAGNOSIS — N2581 Secondary hyperparathyroidism of renal origin: Secondary | ICD-10-CM | POA: Diagnosis not present

## 2019-09-12 DIAGNOSIS — D631 Anemia in chronic kidney disease: Secondary | ICD-10-CM | POA: Diagnosis not present

## 2019-09-12 DIAGNOSIS — N186 End stage renal disease: Secondary | ICD-10-CM | POA: Diagnosis not present

## 2019-09-12 NOTE — Patient Outreach (Signed)
[place] [facility]) Care Management  09/12/2019  Anthony Bullock 1960/01/27 [id number]   Red emmi:  Late entry documentation: Received red emmi alert on 09/09/2019  Called patient with no answer. Received red emmi alert on 09/10/2019  For no scheduled follow up. Placed call to patient with no answer.   PLAN: letter already mailed. Will follow up as planned for red emmi received on 09/09/2019.  Tomasa Rand, RN, BSN, CEN [facility] [employer] [phone]

## 2019-09-12 NOTE — Patient Outreach (Signed)
[place] [facility]) Care Management  09/12/2019  Anthony Bullock Jul 24, 1960 [id number]   2 red emmi alerts:  Placed call to patient to follow up on red emmi from 8/30 and 8/31 no answer. Left a message requesting patient call back.  PLAN: will attempt another outreach in 3-4 business days.   Tomasa Rand, RN, BSN, CEN [facility] [employer] [phone]

## 2019-09-14 DIAGNOSIS — D631 Anemia in chronic kidney disease: Secondary | ICD-10-CM | POA: Diagnosis not present

## 2019-09-14 DIAGNOSIS — N2581 Secondary hyperparathyroidism of renal origin: Secondary | ICD-10-CM | POA: Diagnosis not present

## 2019-09-14 DIAGNOSIS — N186 End stage renal disease: Secondary | ICD-10-CM | POA: Diagnosis not present

## 2019-09-14 DIAGNOSIS — Z992 Dependence on renal dialysis: Secondary | ICD-10-CM | POA: Diagnosis not present

## 2019-09-14 DIAGNOSIS — D509 Iron deficiency anemia, unspecified: Secondary | ICD-10-CM | POA: Diagnosis not present

## 2019-09-17 ENCOUNTER — Other Ambulatory Visit: Payer: Self-pay

## 2019-09-17 DIAGNOSIS — N2581 Secondary hyperparathyroidism of renal origin: Secondary | ICD-10-CM | POA: Diagnosis not present

## 2019-09-17 DIAGNOSIS — Z992 Dependence on renal dialysis: Secondary | ICD-10-CM | POA: Diagnosis not present

## 2019-09-17 DIAGNOSIS — D509 Iron deficiency anemia, unspecified: Secondary | ICD-10-CM | POA: Diagnosis not present

## 2019-09-17 DIAGNOSIS — N186 End stage renal disease: Secondary | ICD-10-CM | POA: Diagnosis not present

## 2019-09-17 DIAGNOSIS — D631 Anemia in chronic kidney disease: Secondary | ICD-10-CM | POA: Diagnosis not present

## 2019-09-17 NOTE — Patient Outreach (Signed)
[place] [facility]) Care Management  09/17/2019  Anthony Bullock October 21, 1960 [id number]   RED EMMI:    Placed 3rd call attempt to patient without success.  Left a message requesting a call back.  PLAN: letter mailed already. Will call back in 4 week for last attempt.  Tomasa Rand, RN, BSN, CEN [facility] [employer] [phone]

## 2019-09-18 ENCOUNTER — Telehealth: Payer: Self-pay | Admitting: Orthopedic Surgery

## 2019-09-18 ENCOUNTER — Encounter: Payer: Self-pay | Admitting: Physician Assistant

## 2019-09-18 ENCOUNTER — Ambulatory Visit (INDEPENDENT_AMBULATORY_CARE_PROVIDER_SITE_OTHER): Payer: Medicare Other | Admitting: Physician Assistant

## 2019-09-18 DIAGNOSIS — M869 Osteomyelitis, unspecified: Secondary | ICD-10-CM

## 2019-09-18 NOTE — Progress Notes (Signed)
Office Visit Note   Patient: Anthony Bullock           Date of Birth: [birth year]           MRN: [id number] Visit Date: 09/18/2019              Requested by: [name], MD [place],  [place] [address] PCP: [name], MD  No chief complaint on file.     HPI: Patient presents today he is 4 weeks status post left fifth ray amputation.  He has been slow to heal.  He is using a nitroglycerin patch.  Wife is doing daily dressing changes  Assessment & Plan: Visit Diagnoses: No diagnosis found.  Plan: I have given his wife a small amount of Silvadene to use over the area of the thickened eschar.  She does not feel becomes too moist to stop using it she has used in the past follow-up in 1 week  Follow-Up Instructions: No follow-ups on file.   Ortho Exam  Patient is alert, oriented, no adenopathy, well-dressed, normal affect, normal respiratory effort. Focused examination swelling has completely receded nitroglycerin patch is in place sutures are in place.  He has had a wound dehiscence distally that is filled in with thickened eschar.  He also has some dry skin on the bottom of his foot which was debrided.  No foul odor no cellulitis debriding did produce some bleeding which was easily controlled  Imaging: No results found. No images are attached to the encounter.  Labs: Lab Results  Component Value Date   HGBA1C 8.1 (H) 07/10/2019   HGBA1C 8.9 (H) 08/06/2018   HGBA1C 11.6 02/15/2018   ESRSEDRATE 94 (H) 03/30/2016   ESRSEDRATE 18 11/30/2012   CRP 16.1 (H) 03/30/2016   REPTSTATUS 09/08/2019 FINAL 09/03/2019   GRAMSTAIN  03/31/2016    RARE WBC PRESENT, PREDOMINANTLY PMN NO ORGANISMS SEEN    CULT  09/03/2019    NO GROWTH 5 DAYS Performed at [facility], [address], [place], [place] [address]    [place] 03/31/2016   LABORGA ENTEROBACTER SPECIES 03/31/2016     Lab Results  Component Value Date   ALBUMIN  2.6 (L) 09/04/2019   ALBUMIN 2.6 (L) 08/30/2019   ALBUMIN 2.9 (L) 08/18/2019    Lab Results  Component Value Date   MG 2.1 09/04/2019   MG 1.7 08/18/2019   MG 2.1 12/03/2014   No results found for: VD25OH  No results found for: PREALBUMIN CBC EXTENDED Latest Ref Rng & Units 09/04/2019 09/03/2019 08/30/2019  WBC 4.0 - 10.5 K/uL 7.2 9.1 14.3(H)  RBC 4.22 - 5.81 MIL/uL 3.26(L) 3.32(L) 3.50(L)  HGB 13.0 - 17.0 g/dL 8.9(L) 9.1(L) 9.6(L)  HCT 39 - 52 % 28.8(L) 29.4(L) 31.5(L)  PLT 150 - 400 K/uL 209 207 179  NEUTROABS 1.7 - 7.7 K/uL 5.4 7.4 12.0(H)  LYMPHSABS 0.7 - 4.0 K/uL 1.1 0.8 0.8     There is no height or weight on file to calculate BMI.  Orders:  No orders of the defined types were placed in this encounter.  No orders of the defined types were placed in this encounter.    Procedures: No procedures performed  Clinical Data: No additional findings.  ROS:  All other systems negative, except as noted in the HPI. Review of Systems  Objective: Vital Signs: There were no vitals taken for this visit.  Specialty Comments:  No specialty comments available.  PMFS History: Patient Active  Problem List   Diagnosis Date Noted  . Acute pulmonary edema (HCC)   . Osteomyelitis of fifth toe of left foot ([place])   . Febrile illness 08/17/2019  . SIRS (systemic inflammatory response syndrome) ([place]) 08/17/2019  . Thrombocytopenia ([place]) 08/17/2019  . Pressure injury of skin 08/17/2019  . Hypertensive emergency 07/10/2019  . Onychomycosis 08/16/2016  . Status post unilateral below knee amputation, right ([place]) 04/26/2016  . Bilateral carotid bruits 03/30/2016  . Diabetes mellitus with complication ([place])   . Anemia due to chronic kidney disease   . ESRD (end stage renal disease) on dialysis ([place]) 11/30/2015  . Acute respiratory failure with hypoxia ([place]) 11/29/2015  . At high risk for falls 09/29/2014  . Peripheral vascular disease ([place]) 09/29/2014  . Osteoarthritis of both  knees 01/07/2014  . Osteoarthritis of multiple joints 10/06/2013  . NICM (nonischemic cardiomyopathy) ([place]) 06/27/2013  . CAD (coronary artery disease) 03/13/2013  . Abnormal nuclear stress test 01/27/2013  . Chronic systolic congestive heart failure ([place]) 01/27/2013  . COPD (chronic obstructive pulmonary disease) ([place]) 12/27/2012  . Diabetic peripheral neuropathy ([place]) 02/10/2012  . URINARY CALCULUS 09/28/2009  . Adjustment disorder with depressed mood 08/21/2009  . Poorly controlled type II diabetes mellitus with renal complication ([place]) 16/10/9602  . Hyperlipidemia 04/14/2008  . Essential hypertension 04/14/2008  . ALLERGIC RHINITIS 04/14/2008   Past Medical History:  Diagnosis Date  . 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 Allergic rhinitis   . Anemia   . Arthritis   . Chronic systolic heart failure ([place])    a. ECHO (12/2012) EF 25-30%, HK entireanteroseptal myocardium //  b.  EF 25%, diffuse HK, grade 1 diastolic dysfunction, MAC, mild LAE, normal RVSF, trivial pericardial effusion  . COPD (chronic obstructive pulmonary disease) ([place])   . Diabetes mellitus type II   . Diabetic nephropathy ([place])   . Diabetic neuropathy ([place])   . ESRD on hemodialysis [facility])    started HD June 2017, goes to [facility] HD unit, Dr Hinda Lenis  . History of cardiac catheterization    a.Myoview 1/15:  There is significant left ventricular dysfunction. There may be slight scar at the apex. There is no significant ischemia. LV Ejection Fraction: 27%  //  b. RHC/LHC (1/15) with mean RA 6, PA 47/22 mean 33, mean PCWP 20, PVR 2.5 WU, CI 2.5; 80% dLAD stenosis, 70% diffuse large D.   . History of kidney stones   . Hyperlipidemia   . Hypertension   . Kidney stones   . NICM (nonischemic cardiomyopathy) ([place])    Primarily nonischemic. Echo (12/14) with EF 25-30%. Echo (3/15) with EF 25%, mild to moderately dilated LV, normal RV size and systolic function.   .  Osteomyelitis ([place])    left fifth ray  . Pneumonia   . Urethral stricture   . Wears glasses     Family History  Problem Relation Age of Onset  . Bladder Cancer Mother   . Alcohol abuse Father   . Melanoma Father   . Stroke Maternal Grandmother   . Heart Problems Maternal Grandmother        unknown  . Diabetes Maternal Grandmother   . Heart disease Maternal Grandfather   . Prostate cancer Maternal Grandfather     Past Surgical History:  Procedure Laterality Date  . ABDOMINAL AORTOGRAM W/LOWER EXTREMITY N/A 03/30/2016   Procedure: Abdominal Aortogram w/Lower Extremity;  Surgeon: Angelia Mould, MD;  Location: [place] CV LAB;  Service: Cardiovascular;  Laterality: N/A;  . AMPUTATION Right 04/26/2016   Procedure: Right Below Knee Amputation;  Surgeon: Newt Minion, MD;  Location: [place];  Service: Orthopedics;  Laterality: Right;  . AMPUTATION Left 08/21/2019   Procedure: LEFT FOOT 5TH RAY AMPUTATION;  Surgeon: Newt Minion, MD;  Location: [place];  Service: Orthopedics;  Laterality: Left;  . AV FISTULA PLACEMENT Right 09/08/2015   Procedure: INSERTION OF 4-38m x 45cm  ARTERIOVENOUS (AV) GORE-TEX GRAFT RIGHT UPPER  ARM;  Surgeon: CAngelia Mould MD;  Location: [place]  Service: Vascular;  Laterality: Right;  . AV FISTULA PLACEMENT Left 01/14/2016   Procedure: CREATION OF LEFT UPPER ARM ARTERIOVENOUS FISTULA;  Surgeon: CAngelia Mould MD;  Location: [place]  Service: Vascular;  Laterality: Left;  . BASCILIC VEIN TRANSPOSITION Right 08/22/2014   Procedure: RIGHT UPPER ARM BASCILIC VEIN TRANSPOSITION;  Surgeon: CAngelia Mould MD;  Location: [place]  Service: Vascular;  Laterality: Right;  . BELOW KNEE LEG AMPUTATION Right 04/26/2016  . CARDIAC CATHETERIZATION    . CARDIAC DEFIBRILLATOR PLACEMENT  06/27/2013   Sub Q       BY DR KCaryl Comes . CATARACT EXTRACTION W/PHACO Right 08/06/2018   Procedure: CATARACT EXTRACTION PHACO AND INTRAOCULAR LENS PLACEMENT (IOC);  Surgeon:  WBaruch Goldmann MD;  [facility];  Service: Ophthalmology;  Laterality: Right;  CDE: 4.06  . CATARACT EXTRACTION W/PHACO Left 08/20/2018   Procedure: CATARACT EXTRACTION PHACO AND INTRAOCULAR LENS PLACEMENT (IOC);  Surgeon: WBaruch Goldmann MD;  [facility];  Service: Ophthalmology;  Laterality: Left;  CDE: 6.76  . COLONOSCOPY WITH PROPOFOL N/A 07/22/2015   Procedure: COLONOSCOPY WITH PROPOFOL;  Surgeon: HDoran Stabler MD;  [facility];  Service: Gastroenterology;  Laterality: N/A;  . FEMORAL-POPLITEAL BYPASS GRAFT Right 03/31/2016   Procedure: BYPASS GRAFT FEMORAL-POPLITEAL ARTERY USING RIGHT GREATER SAPHENOUS NONREVERSED VEIN;  Surgeon: CAngelia Mould MD;  Location[place]  Service: Vascular;  Laterality: Right;  . HERNIA REPAIR    . I & D EXTREMITY Right 03/31/2016   Procedure: IRRIGATION AND DEBRIDEMENT FOOT;  Surgeon: CAngelia Mould MD;  Location: [place]  Service: Vascular;  Laterality: Right;  . IMPLANTABLE CARDIOVERTER DEFIBRILLATOR IMPLANT N/A 06/27/2013   Procedure: SUB Q ICD;  Surgeon: SDeboraha Sprang MD;  [facility];  Service: Cardiovascular;  Laterality: N/A;  . INTRAOPERATIVE ARTERIOGRAM Right 03/31/2016   Procedure: INTRA OPERATIVE ARTERIOGRAM;  Surgeon: CAngelia Mould MD;  Location: [place]  Service: Vascular;  Laterality: Right;  . IR GENERIC HISTORICAL Right 11/30/2015   IR THROMBECTOMY AV FISTULA W/THROMBOLYSIS/PTA INC/SHUNT/IMG RIGHT 11/30/2015 GAletta Edouard MD [facility]  . IR GENERIC HISTORICAL  11/30/2015   IR [place]GUIDE VASC ACCESS RIGHT 11/30/2015 GAletta Edouard MD [facility]  . IR GENERIC HISTORICAL Right 12/15/2015   IR THROMBECTOMY AV FISTULA W/THROMBOLYSIS/PTA/STENT INC/SHUNT/IMG RT 12/15/2015 DArne Cleveland MD [facility]  . IR GENERIC HISTORICAL  12/15/2015   IR [place]oreaGUIDE VASC ACCESS RIGHT 12/15/2015 DArne Cleveland MD [facility]  . IR GENERIC HISTORICAL  12/28/2015   IR FLUORO GUIDE CV LINE RIGHT  12/28/2015 AMarybelle Killings MD [facility]  . IR GENERIC HISTORICAL  12/28/2015   IR [place]GUIDE VASC ACCESS RIGHT 12/28/2015 AMarybelle Killings MD [facility]  . LEFT A ND RIGHT HEART CATH  01/30/2013   DR BSung Amabile . LEFT AND RIGHT HEART CATHETERIZATION WITH CORONARY ANGIOGRAM N/A 01/30/2013   Procedure: LEFT AND RIGHT HEART CATHETERIZATION WITH CORONARY ANGIOGRAM;  Surgeon: Jolaine Artist, MD;  Location: [facility] CATH LAB;  Service: Cardiovascular;  Laterality: N/A;  . PERIPHERAL VASCULAR CATHETERIZATION Right 01/26/2015   Procedure: A/V Fistulagram;  Surgeon: Angelia Mould, MD;  Location: [place] CV LAB;  Service: Cardiovascular;  Laterality: Right;  . reapea urethral surgery for recurrent obstruction  2011  . TOTAL KNEE ARTHROPLASTY Right 2007  . VEIN HARVEST Right 03/31/2016   Procedure: RIGHT GREATER SAPHENOUS VEIN HARVEST;  Surgeon: Angelia Mould, MD;  Location: [place];  Service: Vascular;  Laterality: Right;   Social History   Occupational History  . Not on file  Tobacco Use  . Smoking status: Former Smoker    Packs/day: 2.00    Years: 32.00    Pack years: 64.00    Types: Cigarettes    Quit date: 05/11/2010    Years since quitting: 9.3  . Smokeless tobacco: Never Used  Vaping Use  . Vaping Use: Never used  Substance and Sexual Activity  . Alcohol use: No  . Drug use: No  . Sexual activity: Yes

## 2019-09-18 NOTE — Telephone Encounter (Signed)
Received call from pts wife, Manuela Schwartz. Checking status of her FMLA forms. I advised we just received them today fom [employer] and Dr.Duda will be in the office tomorrow to sign.

## 2019-09-19 ENCOUNTER — Telehealth: Payer: Self-pay | Admitting: Orthopedic Surgery

## 2019-09-19 ENCOUNTER — Ambulatory Visit: Payer: Medicare Other | Admitting: Physician Assistant

## 2019-09-19 DIAGNOSIS — N2581 Secondary hyperparathyroidism of renal origin: Secondary | ICD-10-CM | POA: Diagnosis not present

## 2019-09-19 DIAGNOSIS — D509 Iron deficiency anemia, unspecified: Secondary | ICD-10-CM | POA: Diagnosis not present

## 2019-09-19 DIAGNOSIS — D631 Anemia in chronic kidney disease: Secondary | ICD-10-CM | POA: Diagnosis not present

## 2019-09-19 DIAGNOSIS — Z992 Dependence on renal dialysis: Secondary | ICD-10-CM | POA: Diagnosis not present

## 2019-09-19 DIAGNOSIS — N186 End stage renal disease: Secondary | ICD-10-CM | POA: Diagnosis not present

## 2019-09-19 NOTE — Telephone Encounter (Signed)
Faxed FMLA forms (completed by [employer]) for pts wife to [facility] [phone]

## 2019-09-21 DIAGNOSIS — Z992 Dependence on renal dialysis: Secondary | ICD-10-CM | POA: Diagnosis not present

## 2019-09-21 DIAGNOSIS — N186 End stage renal disease: Secondary | ICD-10-CM | POA: Diagnosis not present

## 2019-09-21 DIAGNOSIS — D631 Anemia in chronic kidney disease: Secondary | ICD-10-CM | POA: Diagnosis not present

## 2019-09-21 DIAGNOSIS — N2581 Secondary hyperparathyroidism of renal origin: Secondary | ICD-10-CM | POA: Diagnosis not present

## 2019-09-21 DIAGNOSIS — D509 Iron deficiency anemia, unspecified: Secondary | ICD-10-CM | POA: Diagnosis not present

## 2019-09-24 DIAGNOSIS — N2581 Secondary hyperparathyroidism of renal origin: Secondary | ICD-10-CM | POA: Diagnosis not present

## 2019-09-24 DIAGNOSIS — D631 Anemia in chronic kidney disease: Secondary | ICD-10-CM | POA: Diagnosis not present

## 2019-09-24 DIAGNOSIS — Z992 Dependence on renal dialysis: Secondary | ICD-10-CM | POA: Diagnosis not present

## 2019-09-24 DIAGNOSIS — D509 Iron deficiency anemia, unspecified: Secondary | ICD-10-CM | POA: Diagnosis not present

## 2019-09-24 DIAGNOSIS — N186 End stage renal disease: Secondary | ICD-10-CM | POA: Diagnosis not present

## 2019-09-25 ENCOUNTER — Encounter: Payer: Self-pay | Admitting: Family

## 2019-09-25 ENCOUNTER — Ambulatory Visit (INDEPENDENT_AMBULATORY_CARE_PROVIDER_SITE_OTHER): Payer: Medicare Other | Admitting: Family

## 2019-09-25 VITALS — Ht 70.0 in | Wt 202.0 lb

## 2019-09-25 DIAGNOSIS — Z89422 Acquired absence of other left toe(s): Secondary | ICD-10-CM

## 2019-09-25 NOTE — Progress Notes (Signed)
Post-Op Visit Note   Patient: Anthony Bullock           Date of Birth: 1960/01/27           MRN: [id number] Visit Date: 09/25/2019 PCP: [name], MD  Chief Complaint:  Chief Complaint  Patient presents with  . Left Foot - Routine Post Op    08/21/19 left foot 5th ray amputation     HPI:  HPI The patient is a 59 year old gentleman seen status post left foot fifth ray amputation on August 11 unfortunately he did have some mild dehiscence of his incision has been doing daily Dial soap cleansing dry dressing changes.  His sutures are in place.  Ortho Exam Incision approximated sutures there is granulation and improved healing proximally.  Sutures harvested today proximally without incident.  He does have some eschar and fibrinous tissue over the incision distally about 4 cm in length.  This has gaped open about a centimeter is covered with eschar there is no active drainage no surrounding erythema he does report some drainage on his dressing no purulence no foul odor  Visit Diagnoses:  1. History of complete ray amputation of fifth toe of left foot ([place])     Plan: Continue daily Dial soap cleansing.  Dry dressing changes.  Minimize weightbearing.  Elevate for swelling follow-up in the office in 2 weeks  Follow-Up Instructions: Return in about 2 weeks (around 10/09/2019).   Imaging: No results found.  Orders:  No orders of the defined types were placed in this encounter.  No orders of the defined types were placed in this encounter.    PMFS History: Patient Active Problem List   Diagnosis Date Noted  . Acute pulmonary edema (HCC)   . Osteomyelitis of fifth toe of left foot ([place])   . Febrile illness 08/17/2019  . SIRS (systemic inflammatory response syndrome) ([place]) 08/17/2019  . Thrombocytopenia ([place]) 08/17/2019  . Pressure injury of skin 08/17/2019  . Hypertensive emergency 07/10/2019  . Onychomycosis 08/16/2016  . Status post unilateral below knee amputation,  right ([place]) 04/26/2016  . Bilateral carotid bruits 03/30/2016  . Diabetes mellitus with complication ([place])   . Anemia due to chronic kidney disease   . ESRD (end stage renal disease) on dialysis ([place]) 11/30/2015  . Acute respiratory failure with hypoxia ([place]) 11/29/2015  . At high risk for falls 09/29/2014  . Peripheral vascular disease ([place]) 09/29/2014  . Osteoarthritis of both knees 01/07/2014  . Osteoarthritis of multiple joints 10/06/2013  . NICM (nonischemic cardiomyopathy) ([place]) 06/27/2013  . CAD (coronary artery disease) 03/13/2013  . Abnormal nuclear stress test 01/27/2013  . Chronic systolic congestive heart failure ([place]) 01/27/2013  . COPD (chronic obstructive pulmonary disease) ([place]) 12/27/2012  . Diabetic peripheral neuropathy ([place]) 02/10/2012  . URINARY CALCULUS 09/28/2009  . Adjustment disorder with depressed mood 08/21/2009  . Poorly controlled type II diabetes mellitus with renal complication ([place]) 82/42/3536  . Hyperlipidemia 04/14/2008  . Essential hypertension 04/14/2008  . ALLERGIC RHINITIS 04/14/2008   Past Medical History:  Diagnosis Date  . 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 Allergic rhinitis   . Anemia   . Arthritis   . Chronic systolic heart failure ([place])    a. ECHO (12/2012) EF 25-30%, HK entireanteroseptal myocardium //  b.  EF 25%, diffuse HK, grade 1 diastolic dysfunction, MAC, mild LAE, normal RVSF, trivial pericardial effusion  . COPD (chronic obstructive pulmonary disease) ([place])   . Diabetes mellitus type  II   . Diabetic nephropathy ([place])   . Diabetic neuropathy ([place])   . ESRD on hemodialysis [facility])    started HD June 2017, goes to [facility] HD unit, Dr Hinda Lenis  . History of cardiac catheterization    a.Myoview 1/15:  There is significant left ventricular dysfunction. There may be slight scar at the apex. There is no significant ischemia. LV Ejection Fraction: 27%  //  b. RHC/LHC (1/15) with mean RA 6,  PA 47/22 mean 33, mean PCWP 20, PVR 2.5 WU, CI 2.5; 80% dLAD stenosis, 70% diffuse large D.   . History of kidney stones   . Hyperlipidemia   . Hypertension   . Kidney stones   . NICM (nonischemic cardiomyopathy) ([place])    Primarily nonischemic. Echo (12/14) with EF 25-30%. Echo (3/15) with EF 25%, mild to moderately dilated LV, normal RV size and systolic function.   . Osteomyelitis ([place])    left fifth ray  . Pneumonia   . Urethral stricture   . Wears glasses     Family History  Problem Relation Age of Onset  . Bladder Cancer Mother   . Alcohol abuse Father   . Melanoma Father   . Stroke Maternal Grandmother   . Heart Problems Maternal Grandmother        unknown  . Diabetes Maternal Grandmother   . Heart disease Maternal Grandfather   . Prostate cancer Maternal Grandfather     Past Surgical History:  Procedure Laterality Date  . ABDOMINAL AORTOGRAM W/LOWER EXTREMITY N/A 03/30/2016   Procedure: Abdominal Aortogram w/Lower Extremity;  Surgeon: Angelia Mould, MD;  Location: [facility];  Service: Cardiovascular;  Laterality: N/A;  . AMPUTATION Right 04/26/2016   Procedure: Right Below Knee Amputation;  Surgeon: Newt Minion, MD;  Location: [place];  Service: Orthopedics;  Laterality: Right;  . AMPUTATION Left 08/21/2019   Procedure: LEFT FOOT 5TH RAY AMPUTATION;  Surgeon: Newt Minion, MD;  Location: [place];  Service: Orthopedics;  Laterality: Left;  . AV FISTULA PLACEMENT Right 09/08/2015   Procedure: INSERTION OF 4-46m x 45cm  ARTERIOVENOUS (AV) GORE-TEX GRAFT RIGHT UPPER  ARM;  Surgeon: CAngelia Mould MD;  Location: [place]  Service: Vascular;  Laterality: Right;  . AV FISTULA PLACEMENT Left 01/14/2016   Procedure: CREATION OF LEFT UPPER ARM ARTERIOVENOUS FISTULA;  Surgeon: CAngelia Mould MD;  Location[place] [facility]  Service: Vascular;  Laterality: Left;  . BASCILIC VEIN TRANSPOSITION Right 08/22/2014   Procedure: RIGHT UPPER ARM BASCILIC VEIN TRANSPOSITION;   Surgeon: CAngelia Mould MD;  [place]  Service: Vascular;  Laterality: Right;  . BELOW KNEE LEG AMPUTATION Right 04/26/2016  . CARDIAC CATHETERIZATION    . CARDIAC DEFIBRILLATOR PLACEMENT  06/27/2013   Sub Q       BY DR KCaryl Comes . CATARACT EXTRACTION W/PHACO Right 08/06/2018   Procedure: CATARACT EXTRACTION PHACO AND INTRAOCULAR LENS PLACEMENT (IOC);  Surgeon: WBaruch Goldmann MD;  Location: [facility];  Service: Ophthalmology;  Laterality: Right;  CDE: 4.06  . CATARACT EXTRACTION W/PHACO Left 08/20/2018   Procedure: CATARACT EXTRACTION PHACO AND INTRAOCULAR LENS PLACEMENT (IOC);  Surgeon: WBaruch Goldmann MD;  Location: [facility];  Service: Ophthalmology;  Laterality: Left;  CDE: 6.76  . COLONOSCOPY WITH PROPOFOL N/A 07/22/2015   Procedure: COLONOSCOPY WITH PROPOFOL;  Surgeon: HDoran Stabler MD;  [facility]: [facility];  Service: Gastroenterology;  Laterality: N/A;  . FEMORAL-POPLITEAL BYPASS GRAFT Right 03/31/2016   Procedure: BYPASS GRAFT  FEMORAL-POPLITEAL ARTERY USING RIGHT GREATER SAPHENOUS NONREVERSED VEIN;  Surgeon: Angelia Mould, MD;  Location: [place];  Service: Vascular;  Laterality: Right;  . HERNIA REPAIR    . I & D EXTREMITY Right 03/31/2016   Procedure: IRRIGATION AND DEBRIDEMENT FOOT;  Surgeon: Angelia Mould, MD;  Location: [place];  Service: Vascular;  Laterality: Right;  . IMPLANTABLE CARDIOVERTER DEFIBRILLATOR IMPLANT N/A 06/27/2013   Procedure: SUB Q ICD;  Surgeon: Deboraha Sprang, MD;  Location: [facility] C[facility] LAB;  Service: Cardiovascular;  Laterality: N/A;  . INTRAOPERATIVE ARTERIOGRAM Right 03/31/2016   Procedure: INTRA OPERATIVE ARTERIOGRAM;  Surgeon: Angelia Mould, MD;  Location: [place];  Service: Vascular;  Laterality: Right;  . IR GENERIC HISTORICAL Right 11/30/2015   IR THROMBECTOMY AV FISTULA W/THROMBOLYSIS/PTA INC/SHUNT/IMG RIGHT 11/30/2015 Aletta Edouard, MD [facility]  . IR GENERIC HISTORICAL  11/30/2015   IR US GUIDE VASC ACCESS RIGHT 11/30/2015  Aletta Edouard, MD [facility]  . IR GENERIC HISTORICAL Right 12/15/2015   IR THROMBECTOMY AV FISTULA W/THROMBOLYSIS/PTA/STENT INC/SHUNT/IMG RT 12/15/2015 Arne Cleveland, MD [facility]  . IR GENERIC HISTORICAL  12/15/2015   IR US GUIDE VASC ACCESS RIGHT 12/15/2015 Arne Cleveland, MD [facility]  . IR GENERIC HISTORICAL  12/28/2015   IR FLUORO GUIDE CV LINE RIGHT 12/28/2015 Marybelle Killings, MD [facility]  . IR GENERIC HISTORICAL  12/28/2015   IR US GUIDE VASC ACCESS RIGHT 12/28/2015 Marybelle Killings, MD [facility]  . LEFT A ND RIGHT HEART CATH  01/30/2013   DR Sung Amabile  . LEFT AND RIGHT HEART CATHETERIZATION WITH CORONARY ANGIOGRAM N/A 01/30/2013   Procedure: LEFT AND RIGHT HEART CATHETERIZATION WITH CORONARY ANGIOGRAM;  Surgeon: Jolaine Artist, MD;  Location: [facility] CATH LAB;  Service: Cardiovascular;  Laterality: N/A;  . PERIPHERAL VASCULAR CATHETERIZATION Right 01/26/2015   Procedure: A/V Fistulagram;  Surgeon: Angelia Mould, MD;  Location: [place] [facility];  Service: Cardiovascular;  Laterality: Right;  . reapea urethral surgery for recurrent obstruction  2011  . TOTAL KNEE ARTHROPLASTY Right 2007  . VEIN HARVEST Right 03/31/2016   Procedure: RIGHT GREATER SAPHENOUS VEIN HARVEST;  Surgeon: Angelia Mould, MD;  Location: [place];  Service: Vascular;  Laterality: Right;   Social History   Occupational History  . Not on file  Tobacco Use  . Smoking status: Former Smoker    Packs/day: 2.00    Years: 32.00    Pack years: 64.00    Types: Cigarettes    Quit date: 05/11/2010    Years since quitting: 9.3  . Smokeless tobacco: Never Used  Vaping Use  . Vaping Use: Never used  Substance and Sexual Activity  . Alcohol use: No  . Drug use: No  . Sexual activity: Yes

## 2019-09-26 DIAGNOSIS — Z992 Dependence on renal dialysis: Secondary | ICD-10-CM | POA: Diagnosis not present

## 2019-09-26 DIAGNOSIS — D631 Anemia in chronic kidney disease: Secondary | ICD-10-CM | POA: Diagnosis not present

## 2019-09-26 DIAGNOSIS — D509 Iron deficiency anemia, unspecified: Secondary | ICD-10-CM | POA: Diagnosis not present

## 2019-09-26 DIAGNOSIS — N186 End stage renal disease: Secondary | ICD-10-CM | POA: Diagnosis not present

## 2019-09-26 DIAGNOSIS — N2581 Secondary hyperparathyroidism of renal origin: Secondary | ICD-10-CM | POA: Diagnosis not present

## 2019-09-28 DIAGNOSIS — D631 Anemia in chronic kidney disease: Secondary | ICD-10-CM | POA: Diagnosis not present

## 2019-09-28 DIAGNOSIS — Z992 Dependence on renal dialysis: Secondary | ICD-10-CM | POA: Diagnosis not present

## 2019-09-28 DIAGNOSIS — D509 Iron deficiency anemia, unspecified: Secondary | ICD-10-CM | POA: Diagnosis not present

## 2019-09-28 DIAGNOSIS — N186 End stage renal disease: Secondary | ICD-10-CM | POA: Diagnosis not present

## 2019-09-28 DIAGNOSIS — N2581 Secondary hyperparathyroidism of renal origin: Secondary | ICD-10-CM | POA: Diagnosis not present

## 2019-10-01 DIAGNOSIS — N186 End stage renal disease: Secondary | ICD-10-CM | POA: Diagnosis not present

## 2019-10-01 DIAGNOSIS — N2581 Secondary hyperparathyroidism of renal origin: Secondary | ICD-10-CM | POA: Diagnosis not present

## 2019-10-01 DIAGNOSIS — Z992 Dependence on renal dialysis: Secondary | ICD-10-CM | POA: Diagnosis not present

## 2019-10-01 DIAGNOSIS — D631 Anemia in chronic kidney disease: Secondary | ICD-10-CM | POA: Diagnosis not present

## 2019-10-01 DIAGNOSIS — D509 Iron deficiency anemia, unspecified: Secondary | ICD-10-CM | POA: Diagnosis not present

## 2019-10-02 ENCOUNTER — Other Ambulatory Visit: Payer: Self-pay | Admitting: Family Medicine

## 2019-10-03 DIAGNOSIS — N186 End stage renal disease: Secondary | ICD-10-CM | POA: Diagnosis not present

## 2019-10-03 DIAGNOSIS — Z992 Dependence on renal dialysis: Secondary | ICD-10-CM | POA: Diagnosis not present

## 2019-10-03 DIAGNOSIS — N2581 Secondary hyperparathyroidism of renal origin: Secondary | ICD-10-CM | POA: Diagnosis not present

## 2019-10-03 DIAGNOSIS — D631 Anemia in chronic kidney disease: Secondary | ICD-10-CM | POA: Diagnosis not present

## 2019-10-03 DIAGNOSIS — D509 Iron deficiency anemia, unspecified: Secondary | ICD-10-CM | POA: Diagnosis not present

## 2019-10-05 DIAGNOSIS — D631 Anemia in chronic kidney disease: Secondary | ICD-10-CM | POA: Diagnosis not present

## 2019-10-05 DIAGNOSIS — N186 End stage renal disease: Secondary | ICD-10-CM | POA: Diagnosis not present

## 2019-10-05 DIAGNOSIS — N2581 Secondary hyperparathyroidism of renal origin: Secondary | ICD-10-CM | POA: Diagnosis not present

## 2019-10-05 DIAGNOSIS — Z992 Dependence on renal dialysis: Secondary | ICD-10-CM | POA: Diagnosis not present

## 2019-10-05 DIAGNOSIS — D509 Iron deficiency anemia, unspecified: Secondary | ICD-10-CM | POA: Diagnosis not present

## 2019-10-06 NOTE — Progress Notes (Signed)
Cardiology Office Note  Date: 10/07/2019   ID: Anthony, Bullock 1960-02-06, MRN [id number]  PCP:  [name], MD  Cardiologist:  Carlyle Dolly, MD Electrophysiologist:  Virl Axe, MD   Chief Complaint: chronic systolic heart failure  History of Present Illness: Anthony Bullock is a 59 y.o. male with a history of chronic systolic heart failure, NICM, ESRD, HTN, CAD, HLD, DM2, Carotid stenosis,  ICD: Sees Dr. Caryl Comes  Last visit with Dr. Harl Bowie 08/09/2019.  History of chronic systolic heart failure with nonischemic cardiomyopathy. Echo 2015 LVEF 25%.  More recent echo 08/2015 EF improved to 50 to 55%.  Some of his CHF medications were decreased due to low blood pressures on hemodialysis.  Patient has an ICD.  Echo 2018 EF 55%, G1 DD.  06/2019 [facility] ER visit with a cough and shortness of breath.  Decreased O2 sats and elevated blood pressures.  Placed on CPAP.  Transferred to [facility] for treatment of pneumonia and pulmonary edema with extra hemodialysis sessions.  Dry weight at HD had been decreased to 92.5 kg.  He was being considered for transplant at [facility].  Continued with ongoing issues with low blood pressures.  Was taking his Coreg after HD.  No recent chest pain.  Lipids on 04/2019 TG 187, HDL 37, LDL 84.  Hemoglobin A1c on June/2021 8.1 and followed by PCP.  Carotid stenosis followed by vascular.  Last study in 2019 showed mild bilateral disease.  Recent admission on 09/02/2019 with shortness of breath.  In the emergency room he had signs of volume overload, hypoxia, tachycardia, elevated blood pressure and creatinine.  Nephrology was consulted for hemodialysis.  Started on nitroglycerin drip for elevated blood pressure.  Underwent hemodialysis.  Admission diagnosis was acute hypoxic respiratory failure due to volume overload improved, hypertensive emergency/hypertensive crisis, acute on chronic diastolic heart failure, end-stage renal disease on hemodialysis.   Hyponatremia from renal failure.  His sodium was stable.  Anemia of chronic disease from renal failure, hemoglobin stable.  He presents today for hospital follow-up.  States he is feeling much better.  States his dialysis is going well and he has a permacath in place in right upper chest.  He denies any anginal symptoms, palpitations or arrhythmias, dyspnea/shortness of breath, weight gain, lower extremity swelling.  Blood pressure is elevated at 148/56 today.  At discharge he was started on amlodipine for elevated blood pressure.  His carvedilol was decreased to 12.5 p.o. twice daily.  Today blood pressure is 148/56.  He is 7 weeks out from toe amputation on left foot and has an orthopedic shoe on his left foot with Ace wrap.   Past Medical History:  Diagnosis Date  . 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 Allergic rhinitis   . Anemia   . Arthritis   . Chronic systolic heart failure ([place])    a. ECHO (12/2012) EF 25-30%, HK entireanteroseptal myocardium //  b.  EF 25%, diffuse HK, grade 1 diastolic dysfunction, MAC, mild LAE, normal RVSF, trivial pericardial effusion  . COPD (chronic obstructive pulmonary disease) ([place])   . Diabetes mellitus type II   . Diabetic nephropathy ([place])   . Diabetic neuropathy ([place])   . ESRD on hemodialysis [facility])    started HD June 2017, goes to [facility] HD unit, Dr Hinda Lenis  . History of cardiac catheterization    a.Myoview 1/15:  There is significant left ventricular dysfunction. There may be slight scar at  the apex. There is no significant ischemia. LV Ejection Fraction: 27%  //  b. RHC/LHC (1/15) with mean RA 6, PA 47/22 mean 33, mean PCWP 20, PVR 2.5 WU, CI 2.5; 80% dLAD stenosis, 70% diffuse large D.   . History of kidney stones   . Hyperlipidemia   . Hypertension   . Kidney stones   . NICM (nonischemic cardiomyopathy) ([place])    Primarily nonischemic. Echo (12/14) with EF 25-30%. Echo (3/15) with EF 25%, mild to  moderately dilated LV, normal RV size and systolic function.   . Osteomyelitis ([place])    left fifth ray  . Pneumonia   . Urethral stricture   . Wears glasses     Past Surgical History:  Procedure Laterality Date  . ABDOMINAL AORTOGRAM W/LOWER EXTREMITY N/A 03/30/2016   Procedure: Abdominal Aortogram w/Lower Extremity;  Surgeon: Angelia Mould, MD;  Location: [place] CV LAB;  Service: Cardiovascular;  Laterality: N/A;  . AMPUTATION Right 04/26/2016   Procedure: Right Below Knee Amputation;  Surgeon: Newt Minion, MD;  Location: [place];  Service: Orthopedics;  Laterality: Right;  . AMPUTATION Left 08/21/2019   Procedure: LEFT FOOT 5TH RAY AMPUTATION;  Surgeon: Newt Minion, MD;  Location: [place];  Service: Orthopedics;  Laterality: Left;  . AV FISTULA PLACEMENT Right 09/08/2015   Procedure: INSERTION OF 4-70m x 45cm  ARTERIOVENOUS (AV) GORE-TEX GRAFT RIGHT UPPER  ARM;  Surgeon: CAngelia Mould MD;  [place]  Service: Vascular;  Laterality: Right;  . AV FISTULA PLACEMENT Left 01/14/2016   Procedure: CREATION OF LEFT UPPER ARM ARTERIOVENOUS FISTULA;  Surgeon: CAngelia Mould MD;  [place]  Service: Vascular;  Laterality: Left;  . BASCILIC VEIN TRANSPOSITION Right 08/22/2014   Procedure: RIGHT UPPER ARM BASCILIC VEIN TRANSPOSITION;  Surgeon: CAngelia Mould MD;  Location: [place]  Service: Vascular;  Laterality: Right;  . BELOW KNEE LEG AMPUTATION Right 04/26/2016  . CARDIAC CATHETERIZATION    . CARDIAC DEFIBRILLATOR PLACEMENT  06/27/2013   Sub Q       BY DR KCaryl Comes . CATARACT EXTRACTION W/PHACO Right 08/06/2018   Procedure: CATARACT EXTRACTION PHACO AND INTRAOCULAR LENS PLACEMENT (IOC);  Surgeon: WBaruch Goldmann MD;  Location: [facility];  Service: Ophthalmology;  Laterality: Right;  CDE: 4.06  . CATARACT EXTRACTION W/PHACO Left 08/20/2018   Procedure: CATARACT EXTRACTION PHACO AND INTRAOCULAR LENS PLACEMENT (IOC);  Surgeon: WBaruch Goldmann MD;  Location: [facility];  Service: Ophthalmology;  Laterality: Left;  CDE: 6.76  . COLONOSCOPY WITH PROPOFOL N/A 07/22/2015   Procedure: COLONOSCOPY WITH PROPOFOL;  Surgeon: HDoran Stabler MD;  [facility];  Service: Gastroenterology;  Laterality: N/A;  . FEMORAL-POPLITEAL BYPASS GRAFT Right 03/31/2016   Procedure: BYPASS GRAFT FEMORAL-POPLITEAL ARTERY USING RIGHT GREATER SAPHENOUS NONREVERSED VEIN;  Surgeon: CAngelia Mould MD;  [place]  Service: Vascular;  Laterality: Right;  . HERNIA REPAIR    . I & D EXTREMITY Right 03/31/2016   Procedure: IRRIGATION AND DEBRIDEMENT FOOT;  Surgeon: CAngelia Mould MD;  [place]  Service: Vascular;  Laterality: Right;  . IMPLANTABLE CARDIOVERTER DEFIBRILLATOR IMPLANT N/A 06/27/2013   Procedure: SUB Q ICD;  Surgeon: SDeboraha Sprang MD;  [facility];  Service: Cardiovascular;  Laterality: N/A;  . INTRAOPERATIVE ARTERIOGRAM Right 03/31/2016   Procedure: INTRA OPERATIVE ARTERIOGRAM;  Surgeon: CAngelia Mould MD;  Location: [place]  Service: Vascular;  Laterality: Right;  . IR GENERIC HISTORICAL Right 11/30/2015  IR THROMBECTOMY AV FISTULA W/THROMBOLYSIS/PTA INC/SHUNT/IMG RIGHT 11/30/2015 Aletta Edouard, MD [facility]  . IR GENERIC HISTORICAL  11/30/2015   IR US GUIDE VASC ACCESS RIGHT 11/30/2015 Aletta Edouard, MD [facility]  . IR GENERIC HISTORICAL Right 12/15/2015   IR THROMBECTOMY AV FISTULA W/THROMBOLYSIS/PTA/STENT INC/SHUNT/IMG RT 12/15/2015 Arne Cleveland, MD [facility]  . IR GENERIC HISTORICAL  12/15/2015   IR US GUIDE VASC ACCESS RIGHT 12/15/2015 Arne Cleveland, MD [facility]  . IR GENERIC HISTORICAL  12/28/2015   IR FLUORO GUIDE CV LINE RIGHT 12/28/2015 Marybelle Killings, MD [facility]  . IR GENERIC HISTORICAL  12/28/2015   IR US GUIDE VASC ACCESS RIGHT 12/28/2015 Marybelle Killings, MD [facility]  . LEFT A ND RIGHT HEART CATH  01/30/2013   DR Sung Amabile  . LEFT AND RIGHT HEART CATHETERIZATION WITH CORONARY  ANGIOGRAM N/A 01/30/2013   Procedure: LEFT AND RIGHT HEART CATHETERIZATION WITH CORONARY ANGIOGRAM;  Surgeon: Jolaine Artist, MD;  Location: [facility] CATH LAB;  Service: Cardiovascular;  Laterality: N/A;  . PERIPHERAL VASCULAR CATHETERIZATION Right 01/26/2015   Procedure: A/V Fistulagram;  Surgeon: Angelia Mould, MD;  Location: [place] CV LAB;  Service: Cardiovascular;  Laterality: Right;  . reapea urethral surgery for recurrent obstruction  2011  . TOTAL KNEE ARTHROPLASTY Right 2007  . VEIN HARVEST Right 03/31/2016   Procedure: RIGHT GREATER SAPHENOUS VEIN HARVEST;  Surgeon: Angelia Mould, MD;  Location: [facility] OR;  Service: Vascular;  Laterality: Right;    Current Outpatient Medications  Medication Sig Dispense Refill  . albuterol (PROVENTIL HFA;VENTOLIN HFA) 108 (90 Base) MCG/ACT inhaler Inhale 2 puffs into the lungs every 4 (four) hours as needed for wheezing or shortness of breath. 1 Inhaler 1  . amLODipine (NORVASC) 10 MG tablet Take 1 tablet (10 mg total) by mouth daily. 30 tablet 0  . aspirin EC 81 MG tablet Take 81 mg by mouth daily.     [name] atorvastatin (LIPITOR) 80 MG tablet Take 1 tablet (80 mg total) by mouth at bedtime. 90 tablet 3  . azelastine (ASTELIN) 0.1 % nasal spray Place 2 sprays into both nostrils 3 (three) times daily as needed for rhinitis. Use in each nostril as directed 30 mL 12  . carvedilol (COREG) 12.5 MG tablet Take 1 tablet (12.5 mg total) by mouth 2 (two) times daily with a meal. 180 tablet 3  . Continuous Blood Gluc Sensor (FREESTYLE LIBRE 14 DAY SENSOR) MISC USE AS DIRECTED EVERY 14 DAYS 2 each 0  . fenofibrate 160 MG tablet Take 1 tablet (160 mg total) by mouth daily. 90 tablet 3  . fexofenadine (ALLEGRA) 180 MG tablet Take 180 mg by mouth daily.    . furosemide (LASIX) 40 MG tablet Take 3 tablets by mouth once daily 270 tablet 0  . gabapentin (NEURONTIN) 300 MG capsule Take 2 capsules (600 mg total) by mouth 2 (two) times daily. 360 capsule 3  .  glucose blood test strip Check 1 time daily. E11.9 One Touch Ultra Blue Test Strips 100 each 3  . Insulin Glargine (BASAGLAR KWIKPEN) 100 UNIT/ML INJECT 70 UNITS SUBCUTANEOUSLY AT BEDTIME 15 mL 11  . Insulin Lispro (HUMALOG KWIKPEN) 200 UNIT/ML SOPN Inject 10 Units into the skin 3 (three) times daily with meals. Use tid with meals as per sliding scale 4 pen 6  . Insulin Pen Needle (BD PEN NEEDLE NANO U/F) 32G X 4 MM MISC USE 1 PEN NEEDLE SUBCUTANEOUSLY WITH INSULIN 4 TIMES DAILY 400 each 0  . lanthanum (FOSRENOL)  1000 MG chewable tablet Chew 1,000 mg by mouth 3 (three) times daily with meals. Take one tablet after each meal.     . multivitamin (RENA-VIT) TABS tablet Take 1 tablet by mouth daily. 90 tablet 3  . nitroGLYCERIN (NITRODUR - DOSED IN MG/24 HR) 0.2 mg/hr patch Place 1 patch (0.2 mg total) onto the skin daily. 30 patch 4  . Olopatadine HCl (PATADAY) 0.2 % SOLN Place 1 drop into both eyes daily as needed (for allergies).     [name] oxyCODONE-acetaminophen (PERCOCET) 5-325 MG tablet Take 1 tablet by mouth every 4 (four) hours as needed. 30 tablet 0   No current facility-administered medications for this visit.   Allergies:  Epoetin alfa, Ferumoxytol, and Morphine sulfate   Social History: The patient  reports that he quit smoking about 9 years ago. His smoking use included cigarettes. He has a 64.00 pack-year smoking history. He has never used smokeless tobacco. He reports that he does not drink alcohol and does not use drugs.   Family History: The patient's family history includes Alcohol abuse in his father; Bladder Cancer in his mother; Diabetes in his maternal grandmother; Heart Problems in his maternal grandmother; Heart disease in his maternal grandfather; Melanoma in his father; Prostate cancer in his maternal grandfather; Stroke in his maternal grandmother.   ROS:  Please see the history of present illness. Otherwise, complete review of systems is positive for none.  All other systems are  reviewed and negative.   Physical Exam: VS:  BP (!) 148/56   Pulse 78   Ht _0  (1.778 m)   Wt 206 lb 3.2 oz (93.5 kg)   SpO2 98%   BMI 29.59 kg/m , BMI Body mass index is 29.59 kg/m.  Wt Readings from Last 3 Encounters:  10/07/19 206 lb 3.2 oz (93.5 kg)  09/25/19 202 lb (91.6 kg)  09/11/19 202 lb (91.6 kg)    General: Patient appears comfortable at rest. Neck: Supple, no elevated JVP or carotid bruits, no thyromegaly. Lungs: Clear to auscultation, nonlabored breathing at rest. Cardiac: Regular rate and rhythm, no S3 or significant systolic murmur, no pericardial rub. Extremities: No pitting edema, distal pulses 2+. Skin: Warm and dry. Musculoskeletal: No kyphosis. Neuropsychiatric: Alert and oriented x3, affect grossly appropriate.  ECG:  EKG September 04, 2019: Normal sinus rhythm rate of 92, moderate voltage criteria for LVH, T wave abnormality consider lateral ischemia  Recent Labwork: 09/03/2019: B Natriuretic Peptide 1,193.0 09/04/2019: ALT 14; AST 18; BUN 31; Creatinine, Ser 7.89; Hemoglobin 8.9; Magnesium 2.1; Platelets 209; Potassium 3.6; Sodium 134; TSH 0.751     Component Value Date/Time   CHOL 158 05/08/2019 0903   TRIG 187.0 (H) 05/08/2019 0903   HDL 37.00 (L) 05/08/2019 0903   CHOLHDL 4 05/08/2019 0903   VLDL 37.4 05/08/2019 0903   LDLCALC 84 05/08/2019 0903   LDLDIRECT 134.0 04/25/2011 0857    Other Studies Reviewed Today:  Echocardiogram 09/03/2019  1. Left ventricular ejection fraction, by estimation, is 50%. The left ventricle has mildly decreased function. Left ventricular endocardial border not optimally defined to evaluate regional wall motion. There is moderate left ventricular hypertrophy. Left ventricular diastolic parameters are consistent with Grade I diastolic dysfunction (impaired relaxation). 2. Right ventricular systolic function is normal. The right ventricular size is normal. 3. Left atrial size was mildly dilated. 4. The mitral valve is  normal in structure. No evidence of mitral valve regurgitation. No evidence of mitral stenosis. 5. The aortic valve has an indeterminant number  of cusps. Aortic valve regurgitation is not visualized. No aortic stenosis is present. 6. The inferior vena cava is normal in size with greater than 50% respiratory variability, suggesting right atrial pressure of 3 mmHg.   08/2015 echo Study Conclusions  - Left ventricle: The cavity size was normal. Systolic function was normal. The estimated ejection fraction was in the range of 50% to 55%. Wall motion was normal; there were no regional wall motion abnormalities. Doppler parameters are consistent with abnormal left ventricular relaxation (grade 1 diastolic dysfunction). - Left atrium: The atrium was mildly dilated. - Atrial septum: No defect or patent foramen ovale was identified.  Jan 2015 cath Findings:  RA = 6 RV = 48/5/7 PA = 47/22 (33) PCW = 20 Fick cardiac output/index = 5.2/2.5 PVR = 2.5 WU SVR = 1192 FA sat = 98% PA sat = 65%, 68%  Ao Pressure: 116/64 (84) LV Pressure: 122/13/18 There was no signficant gradient across the aortic valve on pullback.  Left main: Mild ostial tapering otherwise ok  LAD: Narrow vessel (due to diffuse diabetic vasculopathy). 30% ostial. 40-50% tubular lesion in the mid section. 80% lesion distally. Large diagonal with moderate diffuse disease and 70% lesion in midsection  LCX: Non dominant vessel with diffuse diabetic vasculopathy. Large OM-1. Small OM-2 and OM-3. 2 PLs. 30% mid AV groove CX lesion. 40% lesion in proximal OM-1  RCA: Large dominant vessel with just mild plaque  Assessment and Plan:  1. CAD in native artery   2. Essential hypertension   3. Chronic systolic congestive heart failure ([place])   4. Mixed hyperlipidemia   5. Bilateral carotid artery stenosis   6. End-stage renal disease on hemodialysis ([place])    1. CAD in native artery Denies any current  anginal or exertional symptoms.  Continue aspirin 81 mg daily.  Continue nitroglycerin patch 0.2 mg every 24 hours  2. Essential hypertension Recently started on amlodipine at 10 mg at recent discharge from hospital for hypertensive emergency/hypertensive crisis.  Continue amlodipine 10 mg daily.  Increase carvedilol to 25 mg p.o. twice daily.  Blood pressure today is 148/56.  3. Chronic systolic congestive heart failure ([place]) Recent hospitalization for shortness of breath and volume overload. Recent echocardiogram 09/03/2019 showed LVEF 50%, moderate LVH, G1 DD.  Continue furosemide 120 mg daily.  Denies any recent shortness of breath, weight gain, or lower extremity edema.  4. Mixed hyperlipidemia Continue atorvastatin 80 mg daily, continue fenofibrate 160 mg.  Labs on 05/08/2019 showed: TC 158, triglycerides 187, HDL 37, LDL 84.  5. Bilateral carotid artery stenosis Carotid artery duplex 11/29/2017 bilateral 1 to 39% stenosis right and left ICAs, right CCA nonhemodynamically significant plaque less than 50%.  Left ECA appears to be greater than 50% stenosis.  Right vertebral artery demonstrated antegrade flow.  Left artery demonstrated bidirectional flow.  Left subclavian artery was disturbed.  Normal flow seen and right subclavian artery.  6. End-stage renal disease on hemodialysis ([place]) Continues on HD.  Has an indwelling permacath in right upper chest.  States he is taking his carvedilol after his hemodialysis sessions.  Medication Adjustments/Labs and Tests Ordered: Current medicines are reviewed at length with the patient today.  Concerns regarding medicines are outlined above.   Disposition: Follow-up with Dr. Harl Bowie or APP 6 months Signed, Levell July, NP 10/07/2019 3:02 PM    [facility] [place], [place], [place] [address] Phone: [phone]; Fax: [phone]

## 2019-10-07 ENCOUNTER — Other Ambulatory Visit: Payer: Self-pay

## 2019-10-07 ENCOUNTER — Ambulatory Visit (INDEPENDENT_AMBULATORY_CARE_PROVIDER_SITE_OTHER): Payer: Medicare Other | Admitting: Family Medicine

## 2019-10-07 ENCOUNTER — Encounter: Payer: Self-pay | Admitting: Family Medicine

## 2019-10-07 VITALS — BP 148/56 | HR 78 | Ht 70.0 in | Wt 206.2 lb

## 2019-10-07 DIAGNOSIS — Z992 Dependence on renal dialysis: Secondary | ICD-10-CM

## 2019-10-07 DIAGNOSIS — I10 Essential (primary) hypertension: Secondary | ICD-10-CM | POA: Diagnosis not present

## 2019-10-07 DIAGNOSIS — I2510 Atherosclerotic heart disease of native coronary artery without angina pectoris: Secondary | ICD-10-CM

## 2019-10-07 DIAGNOSIS — E782 Mixed hyperlipidemia: Secondary | ICD-10-CM | POA: Diagnosis not present

## 2019-10-07 DIAGNOSIS — I6523 Occlusion and stenosis of bilateral carotid arteries: Secondary | ICD-10-CM | POA: Diagnosis not present

## 2019-10-07 DIAGNOSIS — I5022 Chronic systolic (congestive) heart failure: Secondary | ICD-10-CM

## 2019-10-07 DIAGNOSIS — N186 End stage renal disease: Secondary | ICD-10-CM | POA: Diagnosis not present

## 2019-10-07 MED ORDER — CARVEDILOL 25 MG PO TABS: 25.0000 mg | ORAL_TABLET | Freq: Two times a day (BID) | ORAL | 2 refills | Status: DC

## 2019-10-07 MED ORDER — AMLODIPINE BESYLATE 10 MG PO TABS
10.0000 mg | ORAL_TABLET | Freq: Every day | ORAL | 2 refills | Status: DC
Start: 2019-10-07 — End: 2019-11-29

## 2019-10-07 NOTE — Patient Instructions (Signed)
Medication Instructions:   Your physician has recommended you make the following change in your medication:   Increase carvedilol to 25 mg by mouth twice daily  Continue other medications the same  Labwork:  None  Testing/Procedures:  None  Follow-Up:  Your physician recommends that you schedule a follow-up appointment in: 6 months.   Any Other Special Instructions Will Be Listed Below (If Applicable).  If you need a refill on your cardiac medications before your next appointment, please call your pharmacy.

## 2019-10-08 DIAGNOSIS — E119 Type 2 diabetes mellitus without complications: Secondary | ICD-10-CM | POA: Diagnosis not present

## 2019-10-08 DIAGNOSIS — D631 Anemia in chronic kidney disease: Secondary | ICD-10-CM | POA: Diagnosis not present

## 2019-10-08 DIAGNOSIS — N186 End stage renal disease: Secondary | ICD-10-CM | POA: Diagnosis not present

## 2019-10-08 DIAGNOSIS — Z794 Long term (current) use of insulin: Secondary | ICD-10-CM | POA: Diagnosis not present

## 2019-10-08 DIAGNOSIS — Z992 Dependence on renal dialysis: Secondary | ICD-10-CM | POA: Diagnosis not present

## 2019-10-08 DIAGNOSIS — N2581 Secondary hyperparathyroidism of renal origin: Secondary | ICD-10-CM | POA: Diagnosis not present

## 2019-10-08 DIAGNOSIS — D509 Iron deficiency anemia, unspecified: Secondary | ICD-10-CM | POA: Diagnosis not present

## 2019-10-09 ENCOUNTER — Ambulatory Visit (INDEPENDENT_AMBULATORY_CARE_PROVIDER_SITE_OTHER): Payer: Medicare Other | Admitting: Family

## 2019-10-09 ENCOUNTER — Encounter: Payer: Self-pay | Admitting: Family

## 2019-10-09 VITALS — Ht 70.0 in | Wt 202.0 lb

## 2019-10-09 DIAGNOSIS — M869 Osteomyelitis, unspecified: Secondary | ICD-10-CM

## 2019-10-09 DIAGNOSIS — Z89422 Acquired absence of other left toe(s): Secondary | ICD-10-CM | POA: Insufficient documentation

## 2019-10-09 NOTE — Progress Notes (Signed)
Post-Op Visit Note   Patient: Anthony Bullock           Date of Birth: 07-Aug-1960           MRN: [id number] Visit Date: 10/09/2019 PCP: [name], MD  Chief Complaint:  No chief complaint on file.   HPI:  HPI The patient is a 59 year old gentleman seen status post left foot fifth ray amputation on August 21, 2019. Unfortunately had some dehiscence of his incision. has been doing daily Dial soap cleansing silvadene dressing changes.  His distal sutures are in place.  Ortho Exam Incision with sutures  In place distally. Harvested today without incident. There is granulation and improved healing proximally.  Eschar debrided with 10 blade knife from area of dehiscence distally. This is 2 cm in diameter. 50% fibrinous tissue following debridement.  Visit Diagnoses:  No diagnosis found.  Plan: Continue daily Dial soap cleansing. Silvadene dressing changes.  Minimize weightbearing.  Elevate for swelling follow-up in the office in 3 weeks  Follow-Up Instructions: No follow-ups on file.   Imaging: No results found.  Orders:  No orders of the defined types were placed in this encounter.  No orders of the defined types were placed in this encounter.    PMFS History: Patient Active Problem List   Diagnosis Date Noted  . Acute pulmonary edema (HCC)   . Osteomyelitis of fifth toe of left foot ([place])   . Febrile illness 08/17/2019  . SIRS (systemic inflammatory response syndrome) ([place]) 08/17/2019  . Thrombocytopenia ([place]) 08/17/2019  . Pressure injury of skin 08/17/2019  . Hypertensive emergency 07/10/2019  . Onychomycosis 08/16/2016  . Status post unilateral below knee amputation, right ([place]) 04/26/2016  . Bilateral carotid bruits 03/30/2016  . Diabetes mellitus with complication ([place])   . Anemia due to chronic kidney disease   . ESRD (end stage renal disease) on dialysis ([place]) 11/30/2015  . Acute respiratory failure with hypoxia ([place]) 11/29/2015  . At high risk for  falls 09/29/2014  . Peripheral vascular disease ([place]) 09/29/2014  . Osteoarthritis of both knees 01/07/2014  . Osteoarthritis of multiple joints 10/06/2013  . NICM (nonischemic cardiomyopathy) ([place]) 06/27/2013  . CAD (coronary artery disease) 03/13/2013  . Abnormal nuclear stress test 01/27/2013  . Chronic systolic congestive heart failure ([place]) 01/27/2013  . COPD (chronic obstructive pulmonary disease) ([place]) 12/27/2012  . Diabetic peripheral neuropathy ([place]) 02/10/2012  . URINARY CALCULUS 09/28/2009  . Adjustment disorder with depressed mood 08/21/2009  . Poorly controlled type II diabetes mellitus with renal complication ([place]) 35/32/9924  . Hyperlipidemia 04/14/2008  . Essential hypertension 04/14/2008  . ALLERGIC RHINITIS 04/14/2008   Past Medical History:  Diagnosis Date  . 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 Allergic rhinitis   . Anemia   . Arthritis   . Chronic systolic heart failure ([place])    a. ECHO (12/2012) EF 25-30%, HK entireanteroseptal myocardium //  b.  EF 25%, diffuse HK, grade 1 diastolic dysfunction, MAC, mild LAE, normal RVSF, trivial pericardial effusion  . COPD (chronic obstructive pulmonary disease) ([place])   . Diabetes mellitus type II   . Diabetic nephropathy ([place])   . Diabetic neuropathy ([place])   . ESRD on hemodialysis [facility])    started HD June 2017, goes to [facility] HD unit, Dr Hinda Lenis  . History of cardiac catheterization    a.Myoview 1/15:  There is significant left ventricular dysfunction. There may be slight scar at the apex. There is no significant  ischemia. LV Ejection Fraction: 27%  //  b. RHC/LHC (1/15) with mean RA 6, PA 47/22 mean 33, mean PCWP 20, PVR 2.5 WU, CI 2.5; 80% dLAD stenosis, 70% diffuse large D.   . History of kidney stones   . Hyperlipidemia   . Hypertension   . Kidney stones   . NICM (nonischemic cardiomyopathy) ([place])    Primarily nonischemic. Echo (12/14) with EF 25-30%. Echo (3/15)  with EF 25%, mild to moderately dilated LV, normal RV size and systolic function.   . Osteomyelitis ([place])    left fifth ray  . Pneumonia   . Urethral stricture   . Wears glasses     Family History  Problem Relation Age of Onset  . Bladder Cancer Mother   . Alcohol abuse Father   . Melanoma Father   . Stroke Maternal Grandmother   . Heart Problems Maternal Grandmother        unknown  . Diabetes Maternal Grandmother   . Heart disease Maternal Grandfather   . Prostate cancer Maternal Grandfather     Past Surgical History:  Procedure Laterality Date  . ABDOMINAL AORTOGRAM W/LOWER EXTREMITY N/A 03/30/2016   Procedure: Abdominal Aortogram w/Lower Extremity;  Surgeon: Angelia Mould, MD;  Location: [place] [facility];  Service: Cardiovascular;  Laterality: N/A;  . AMPUTATION Right 04/26/2016   Procedure: Right Below Knee Amputation;  Surgeon: Newt Minion, MD;  Location: [place];  Service: Orthopedics;  Laterality: Right;  . AMPUTATION Left 08/21/2019   Procedure: LEFT FOOT 5TH RAY AMPUTATION;  Surgeon: Newt Minion, MD;  Location: [place];  Service: Orthopedics;  Laterality: Left;  . AV FISTULA PLACEMENT Right 09/08/2015   Procedure: INSERTION OF 4-64m x 45cm  ARTERIOVENOUS (AV) GORE-TEX GRAFT RIGHT UPPER  ARM;  Surgeon: CAngelia Mould MD;  [place]  Service: Vascular;  Laterality: Right;  . AV FISTULA PLACEMENT Left 01/14/2016   Procedure: CREATION OF LEFT UPPER ARM ARTERIOVENOUS FISTULA;  Surgeon: CAngelia Mould MD;  [place]  Service: Vascular;  Laterality: Left;  . BASCILIC VEIN TRANSPOSITION Right 08/22/2014   Procedure: RIGHT UPPER ARM BASCILIC VEIN TRANSPOSITION;  Surgeon: CAngelia Mould MD;  [place]  Service: Vascular;  Laterality: Right;  . BELOW KNEE LEG AMPUTATION Right 04/26/2016  . CARDIAC CATHETERIZATION    . CARDIAC DEFIBRILLATOR PLACEMENT  06/27/2013   Sub Q       BY DR KCaryl Comes . CATARACT EXTRACTION W/PHACO Right 08/06/2018     Procedure: CATARACT EXTRACTION PHACO AND INTRAOCULAR LENS PLACEMENT (IOC);  Surgeon: WBaruch Goldmann MD;  Location: [facility];  Service: Ophthalmology;  Laterality: Right;  CDE: 4.06  . CATARACT EXTRACTION W/PHACO Left 08/20/2018   Procedure: CATARACT EXTRACTION PHACO AND INTRAOCULAR LENS PLACEMENT (IOC);  Surgeon: WBaruch Goldmann MD;  Location: [facility];  Service: Ophthalmology;  Laterality: Left;  CDE: 6.76  . COLONOSCOPY WITH PROPOFOL N/A 07/22/2015   Procedure: COLONOSCOPY WITH PROPOFOL;  Surgeon: HDoran Stabler MD;  [facility];  Service: Gastroenterology;  Laterality: N/A;  . FEMORAL-POPLITEAL BYPASS GRAFT Right 03/31/2016   Procedure: BYPASS GRAFT FEMORAL-POPLITEAL ARTERY USING RIGHT GREATER SAPHENOUS NONREVERSED VEIN;  Surgeon: CAngelia Mould MD;  [place]  Service: Vascular;  Laterality: Right;  . HERNIA REPAIR    . I & D EXTREMITY Right 03/31/2016   Procedure: IRRIGATION AND DEBRIDEMENT FOOT;  Surgeon: CAngelia Mould MD;  [place]  Service: Vascular;  Laterality: Right;  . IMPLANTABLE  CARDIOVERTER DEFIBRILLATOR IMPLANT N/A 06/27/2013   Procedure: SUB Q ICD;  Surgeon: Deboraha Sprang, MD;  Location: [facility] C[facility] LAB;  Service: Cardiovascular;  Laterality: N/A;  . INTRAOPERATIVE ARTERIOGRAM Right 03/31/2016   Procedure: INTRA OPERATIVE ARTERIOGRAM;  Surgeon: Angelia Mould, MD;  Location: [place];  Service: Vascular;  Laterality: Right;  . IR GENERIC HISTORICAL Right 11/30/2015   IR THROMBECTOMY AV FISTULA W/THROMBOLYSIS/PTA INC/SHUNT/IMG RIGHT 11/30/2015 Aletta Edouard, MD [facility]  . IR GENERIC HISTORICAL  11/30/2015   IR US GUIDE VASC ACCESS RIGHT 11/30/2015 Aletta Edouard, MD [facility]  . IR GENERIC HISTORICAL Right 12/15/2015   IR THROMBECTOMY AV FISTULA W/THROMBOLYSIS/PTA/STENT INC/SHUNT/IMG RT 12/15/2015 Arne Cleveland, MD [facility]  . IR GENERIC HISTORICAL  12/15/2015   IR US GUIDE VASC ACCESS RIGHT 12/15/2015 Arne Cleveland, MD  [facility]  . IR GENERIC HISTORICAL  12/28/2015   IR FLUORO GUIDE CV LINE RIGHT 12/28/2015 Marybelle Killings, MD [facility]  . IR GENERIC HISTORICAL  12/28/2015   IR US GUIDE VASC ACCESS RIGHT 12/28/2015 Marybelle Killings, MD [facility]  . LEFT A ND RIGHT HEART CATH  01/30/2013   DR Sung Amabile  . LEFT AND RIGHT HEART CATHETERIZATION WITH CORONARY ANGIOGRAM N/A 01/30/2013   Procedure: LEFT AND RIGHT HEART CATHETERIZATION WITH CORONARY ANGIOGRAM;  Surgeon: Jolaine Artist, MD;  Location: [facility] CATH LAB;  Service: Cardiovascular;  Laterality: N/A;  . PERIPHERAL VASCULAR CATHETERIZATION Right 01/26/2015   Procedure: A/V Fistulagram;  Surgeon: Angelia Mould, MD;  Location: [place] [facility];  Service: Cardiovascular;  Laterality: Right;  . reapea urethral surgery for recurrent obstruction  2011  . TOTAL KNEE ARTHROPLASTY Right 2007  . VEIN HARVEST Right 03/31/2016   Procedure: RIGHT GREATER SAPHENOUS VEIN HARVEST;  Surgeon: Angelia Mould, MD;  Location: [place];  Service: Vascular;  Laterality: Right;   Social History   Occupational History  . Not on file  Tobacco Use  . Smoking status: Former Smoker    Packs/day: 2.00    Years: 32.00    Pack years: 64.00    Types: Cigarettes    Quit date: 05/11/2010    Years since quitting: 9.4  . Smokeless tobacco: Never Used  Vaping Use  . Vaping Use: Never used  Substance and Sexual Activity  . Alcohol use: No  . Drug use: No  . Sexual activity: Yes

## 2019-10-10 DIAGNOSIS — N2581 Secondary hyperparathyroidism of renal origin: Secondary | ICD-10-CM | POA: Diagnosis not present

## 2019-10-10 DIAGNOSIS — Z992 Dependence on renal dialysis: Secondary | ICD-10-CM | POA: Diagnosis not present

## 2019-10-10 DIAGNOSIS — N186 End stage renal disease: Secondary | ICD-10-CM | POA: Diagnosis not present

## 2019-10-10 DIAGNOSIS — D509 Iron deficiency anemia, unspecified: Secondary | ICD-10-CM | POA: Diagnosis not present

## 2019-10-10 DIAGNOSIS — D631 Anemia in chronic kidney disease: Secondary | ICD-10-CM | POA: Diagnosis not present

## 2019-10-12 DIAGNOSIS — D509 Iron deficiency anemia, unspecified: Secondary | ICD-10-CM | POA: Diagnosis not present

## 2019-10-12 DIAGNOSIS — N186 End stage renal disease: Secondary | ICD-10-CM | POA: Diagnosis not present

## 2019-10-12 DIAGNOSIS — N2581 Secondary hyperparathyroidism of renal origin: Secondary | ICD-10-CM | POA: Diagnosis not present

## 2019-10-12 DIAGNOSIS — D631 Anemia in chronic kidney disease: Secondary | ICD-10-CM | POA: Diagnosis not present

## 2019-10-12 DIAGNOSIS — Z992 Dependence on renal dialysis: Secondary | ICD-10-CM | POA: Diagnosis not present

## 2019-10-15 ENCOUNTER — Other Ambulatory Visit: Payer: Self-pay

## 2019-10-15 DIAGNOSIS — N2581 Secondary hyperparathyroidism of renal origin: Secondary | ICD-10-CM | POA: Diagnosis not present

## 2019-10-15 DIAGNOSIS — Z992 Dependence on renal dialysis: Secondary | ICD-10-CM | POA: Diagnosis not present

## 2019-10-15 DIAGNOSIS — D631 Anemia in chronic kidney disease: Secondary | ICD-10-CM | POA: Diagnosis not present

## 2019-10-15 DIAGNOSIS — D509 Iron deficiency anemia, unspecified: Secondary | ICD-10-CM | POA: Diagnosis not present

## 2019-10-15 DIAGNOSIS — N186 End stage renal disease: Secondary | ICD-10-CM | POA: Diagnosis not present

## 2019-10-15 NOTE — Patient Outreach (Signed)
[facility]) Care Management  10/15/2019  NAS WAFER 10/30/1960 [id number]   Case Closure:  Attempted to reach patient for the 4th outreach call. No answer,  PLAN: case closure as unable to reach.  Tomasa Rand, RN, BSN, CEN [facility] [employer] [phone]

## 2019-10-16 ENCOUNTER — Ambulatory Visit (INDEPENDENT_AMBULATORY_CARE_PROVIDER_SITE_OTHER): Payer: Medicare Other | Admitting: Family Medicine

## 2019-10-16 ENCOUNTER — Encounter: Payer: Self-pay | Admitting: Family Medicine

## 2019-10-16 ENCOUNTER — Other Ambulatory Visit: Payer: Self-pay

## 2019-10-16 VITALS — BP 140/58 | HR 16 | Temp 97.9°F | Resp 16 | Ht 70.0 in | Wt 198.9 lb

## 2019-10-16 DIAGNOSIS — I6523 Occlusion and stenosis of bilateral carotid arteries: Secondary | ICD-10-CM | POA: Diagnosis not present

## 2019-10-16 DIAGNOSIS — Z23 Encounter for immunization: Secondary | ICD-10-CM

## 2019-10-16 DIAGNOSIS — E1129 Type 2 diabetes mellitus with other diabetic kidney complication: Secondary | ICD-10-CM | POA: Diagnosis not present

## 2019-10-16 DIAGNOSIS — E1165 Type 2 diabetes mellitus with hyperglycemia: Secondary | ICD-10-CM

## 2019-10-16 MED ORDER — FREESTYLE LIBRE 14 DAY SENSOR MISC
11 refills | Status: DC
Start: 2019-10-16 — End: 2020-10-28

## 2019-10-16 NOTE — Progress Notes (Signed)
Established Patient Office Visit  Subjective:  Patient ID: Anthony Bullock, male    DOB: Sep 11, 1960  Age: 59 y.o. MRN: [id number]  CC:  Chief Complaint  Patient presents with  . Follow-up    DM    HPI Anthony Bullock presents for medical follow-up.  He has end-stage renal disease on dialysis every Tuesday, Thursday, and Saturday.  He had recent amputation of his right fifth toe following infection to the bone.  He has had previous left below-knee amputation secondary to infection.  He had A1c done at his nephrologist office on 28 September and this was 6.4% which is the best it has been in several years.  He has not had any recent hypoglycemia.  He has been very diligent with diet.  He states he is only taking Basaglar 6 units at night and Humalog about 6 units 3 times daily with meals.  He is requesting refills of his freestyle libre sensor.  Multiple chronic problems- CAD, systolic heart failure, PVD, ESRD on hemodialysis  Still needs flu vaccine.  He has had prior Pneumovax.  He had Covid vaccine and plans to get booster soon  Past Medical History:  Diagnosis Date  . 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 Allergic rhinitis   . Anemia   . Arthritis   . Chronic systolic heart failure ([place])    a. ECHO (12/2012) EF 25-30%, HK entireanteroseptal myocardium //  b.  EF 25%, diffuse HK, grade 1 diastolic dysfunction, MAC, mild LAE, normal RVSF, trivial pericardial effusion  . COPD (chronic obstructive pulmonary disease) ([place])   . Diabetes mellitus type II   . Diabetic nephropathy ([place])   . Diabetic neuropathy ([place])   . ESRD on hemodialysis [facility])    started HD June 2017, goes to [facility] HD unit, Dr Hinda Lenis  . History of cardiac catheterization    a.Myoview 1/15:  There is significant left ventricular dysfunction. There may be slight scar at the apex. There is no significant ischemia. LV Ejection Fraction: 27%  //  b. RHC/LHC (1/15) with mean RA  6, PA 47/22 mean 33, mean PCWP 20, PVR 2.5 WU, CI 2.5; 80% dLAD stenosis, 70% diffuse large D.   . History of kidney stones   . Hyperlipidemia   . Hypertension   . Kidney stones   . NICM (nonischemic cardiomyopathy) ([place])    Primarily nonischemic. Echo (12/14) with EF 25-30%. Echo (3/15) with EF 25%, mild to moderately dilated LV, normal RV size and systolic function.   . Osteomyelitis ([place])    left fifth ray  . Pneumonia   . Urethral stricture   . Wears glasses     Past Surgical History:  Procedure Laterality Date  . ABDOMINAL AORTOGRAM W/LOWER EXTREMITY N/A 03/30/2016   Procedure: Abdominal Aortogram w/Lower Extremity;  Surgeon: Angelia Mould, MD;  Location: [place] [facility];  Service: Cardiovascular;  Laterality: N/A;  . AMPUTATION Right 04/26/2016   Procedure: Right Below Knee Amputation;  Surgeon: Newt Minion, MD;  Location: [place];  Service: Orthopedics;  Laterality: Right;  . AMPUTATION Left 08/21/2019   Procedure: LEFT FOOT 5TH RAY AMPUTATION;  Surgeon: Newt Minion, MD;  Location: [place];  Service: Orthopedics;  Laterality: Left;  . AV FISTULA PLACEMENT Right 09/08/2015   Procedure: INSERTION OF 4-65m x 45cm  ARTERIOVENOUS (AV) GORE-TEX GRAFT RIGHT UPPER  ARM;  Surgeon: CAngelia Mould MD;  Location: [place]  Service: Vascular;  Laterality: Right;  .  AV FISTULA PLACEMENT Left 01/14/2016   Procedure: CREATION OF LEFT UPPER ARM ARTERIOVENOUS FISTULA;  Surgeon: Angelia Mould, MD;  Location: [place];  Service: Vascular;  Laterality: Left;  . BASCILIC VEIN TRANSPOSITION Right 08/22/2014   Procedure: RIGHT UPPER ARM BASCILIC VEIN TRANSPOSITION;  Surgeon: Angelia Mould, MD;  Location: [employer];  Service: Vascular;  Laterality: Right;  . BELOW KNEE LEG AMPUTATION Right 04/26/2016  . CARDIAC CATHETERIZATION    . CARDIAC DEFIBRILLATOR PLACEMENT  06/27/2013   Sub Q       BY DR Caryl Comes  . CATARACT EXTRACTION W/PHACO Right 08/06/2018   Procedure: CATARACT EXTRACTION  PHACO AND INTRAOCULAR LENS PLACEMENT (IOC);  Surgeon: Baruch Goldmann, MD;  Location: [facility];  Service: Ophthalmology;  Laterality: Right;  CDE: 4.06  . CATARACT EXTRACTION W/PHACO Left 08/20/2018   Procedure: CATARACT EXTRACTION PHACO AND INTRAOCULAR LENS PLACEMENT (IOC);  Surgeon: Baruch Goldmann, MD;  Location: [facility];  Service: Ophthalmology;  Laterality: Left;  CDE: 6.76  . COLONOSCOPY WITH PROPOFOL N/A 07/22/2015   Procedure: COLONOSCOPY WITH PROPOFOL;  Surgeon: Doran Stabler, MD;  Location: [facility];  Service: Gastroenterology;  Laterality: N/A;  . FEMORAL-POPLITEAL BYPASS GRAFT Right 03/31/2016   Procedure: BYPASS GRAFT FEMORAL-POPLITEAL ARTERY USING RIGHT GREATER SAPHENOUS NONREVERSED VEIN;  Surgeon: Angelia Mould, MD;  Location: [place];  Service: Vascular;  Laterality: Right;  . HERNIA REPAIR    . I & D EXTREMITY Right 03/31/2016   Procedure: IRRIGATION AND DEBRIDEMENT FOOT;  Surgeon: Angelia Mould, MD;  Location: [place];  Service: Vascular;  Laterality: Right;  . IMPLANTABLE CARDIOVERTER DEFIBRILLATOR IMPLANT N/A 06/27/2013   Procedure: SUB Q ICD;  Surgeon: Deboraha Sprang, MD;  Location: [facility] CATH LAB;  Service: Cardiovascular;  Laterality: N/A;  . INTRAOPERATIVE ARTERIOGRAM Right 03/31/2016   Procedure: INTRA OPERATIVE ARTERIOGRAM;  Surgeon: Angelia Mould, MD;  Location: [place];  Service: Vascular;  Laterality: Right;  . IR GENERIC HISTORICAL Right 11/30/2015   IR THROMBECTOMY AV FISTULA W/THROMBOLYSIS/PTA INC/SHUNT/IMG RIGHT 11/30/2015 Aletta Edouard, MD [facility]  . IR GENERIC HISTORICAL  11/30/2015   IR US GUIDE VASC ACCESS RIGHT 11/30/2015 Aletta Edouard, MD [facility]  . IR GENERIC HISTORICAL Right 12/15/2015   IR THROMBECTOMY AV FISTULA W/THROMBOLYSIS/PTA/STENT INC/SHUNT/IMG RT 12/15/2015 Arne Cleveland, MD [facility]  . IR GENERIC HISTORICAL  12/15/2015   IR US GUIDE VASC ACCESS RIGHT 12/15/2015 Arne Cleveland, MD [facility]  . IR GENERIC  HISTORICAL  12/28/2015   IR FLUORO GUIDE CV LINE RIGHT 12/28/2015 Marybelle Killings, MD [facility]  . IR GENERIC HISTORICAL  12/28/2015   IR US GUIDE VASC ACCESS RIGHT 12/28/2015 Marybelle Killings, MD [facility]  . LEFT A ND RIGHT HEART CATH  01/30/2013   DR Sung Amabile  . LEFT AND RIGHT HEART CATHETERIZATION WITH CORONARY ANGIOGRAM N/A 01/30/2013   Procedure: LEFT AND RIGHT HEART CATHETERIZATION WITH CORONARY ANGIOGRAM;  Surgeon: Jolaine Artist, MD;  Location: [facility] CATH LAB;  Service: Cardiovascular;  Laterality: N/A;  . PERIPHERAL VASCULAR CATHETERIZATION Right 01/26/2015   Procedure: A/V Fistulagram;  Surgeon: Angelia Mould, MD;  Location: [place] [facility];  Service: Cardiovascular;  Laterality: Right;  . reapea urethral surgery for recurrent obstruction  2011  . TOTAL KNEE ARTHROPLASTY Right 2007  . VEIN HARVEST Right 03/31/2016   Procedure: RIGHT GREATER SAPHENOUS VEIN HARVEST;  Surgeon: Angelia Mould, MD;  [facility];  Service: Vascular;  Laterality: Right;    Family History  Problem Relation  Age of Onset  . Bladder Cancer Mother   . Alcohol abuse Father   . Melanoma Father   . Stroke Maternal Grandmother   . Heart Problems Maternal Grandmother        unknown  . Diabetes Maternal Grandmother   . Heart disease Maternal Grandfather   . Prostate cancer Maternal Grandfather     Social History   Socioeconomic History  . Marital status: Married    Spouse name: Not on file  . Number of children: 0  . Years of education: Not on file  . Highest education level: Not on file  Occupational History  . Not on file  Tobacco Use  . Smoking status: Former Smoker    Packs/day: 2.00    Years: 32.00    Pack years: 64.00    Types: Cigarettes    Quit date: 05/11/2010    Years since quitting: 9.4  . Smokeless tobacco: Never Used  Vaping Use  . Vaping Use: Never used  Substance and Sexual Activity  . Alcohol use: No  . Drug use: No  . Sexual activity: Yes  Other Topics  Concern  . 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 Difficulty of Paying Living Expenses: Not on file  Food Insecurity:   . Worried About Charity fundraiser in the Last Year: Not on file  . Ran Out of Food in the Last Year: Not on file  Transportation Needs:   . Lack of Transportation (Medical): Not on file  . Lack of Transportation (Non-Medical): Not on file  Physical Activity:   . Days of Exercise per Week: Not on file  . Minutes of Exercise per Session: Not on file  Stress:   . Feeling of Stress : Not on file  Social Connections:   . Frequency of Communication with Friends and Family: Not on file  . Frequency of Social Gatherings with Friends and Family: Not on file  . Attends Religious Services: Not on file  . Active Member of Clubs or Organizations: Not on file  . Attends Archivist Meetings: Not on file  . Marital Status: Not on file  Intimate Partner Violence:   . Fear of Current or Ex-Partner: Not on file  . Emotionally Abused: Not on file  . Physically Abused: Not on file  . Sexually Abused: Not on file    Outpatient Medications Prior to Visit  Medication Sig Dispense Refill  . albuterol (PROVENTIL HFA;VENTOLIN HFA) 108 (90 Base) MCG/ACT inhaler Inhale 2 puffs into the lungs every 4 (four) hours as needed for wheezing or shortness of breath. 1 Inhaler 1  . amLODipine (NORVASC) 10 MG tablet Take 1 tablet (10 mg total) by mouth daily. 90 tablet 2  . aspirin EC 81 MG tablet Take 81 mg by mouth daily.     [name] atorvastatin (LIPITOR) 80 MG tablet Take 1 tablet (80 mg total) by mouth at bedtime. 90 tablet 3  . azelastine (ASTELIN) 0.1 % nasal spray Place 2 sprays into both nostrils 3 (three) times daily as needed for rhinitis. Use in each nostril as directed 30 mL 12  . carvedilol (COREG) 25 MG tablet Take 1 tablet (25 mg total) by mouth 2 (two) times daily. 180 tablet 2   . fenofibrate 160 MG tablet Take 1 tablet (160 mg total) by mouth daily. 90 tablet 3  .  fexofenadine (ALLEGRA) 180 MG tablet Take 180 mg by mouth daily.    . furosemide (LASIX) 40 MG tablet Take 3 tablets by mouth once daily 270 tablet 0  . gabapentin (NEURONTIN) 300 MG capsule Take 2 capsules (600 mg total) by mouth 2 (two) times daily. 360 capsule 3  . glucose blood test strip Check 1 time daily. E11.9 One Touch Ultra Blue Test Strips 100 each 3  . Insulin Glargine (BASAGLAR KWIKPEN) 100 UNIT/ML INJECT 70 UNITS SUBCUTANEOUSLY AT BEDTIME 15 mL 11  . Insulin Lispro (HUMALOG KWIKPEN) 200 UNIT/ML SOPN Inject 10 Units into the skin 3 (three) times daily with meals. Use tid with meals as per sliding scale 4 pen 6  . Insulin Pen Needle (BD PEN NEEDLE NANO U/F) 32G X 4 MM MISC USE 1 PEN NEEDLE SUBCUTANEOUSLY WITH INSULIN 4 TIMES DAILY 400 each 0  . lanthanum (FOSRENOL) 1000 MG chewable tablet Chew 1,000 mg by mouth 3 (three) times daily with meals. Take one tablet after each meal.     . multivitamin (RENA-VIT) TABS tablet Take 1 tablet by mouth daily. 90 tablet 3  . nitroGLYCERIN (NITRODUR - DOSED IN MG/24 HR) 0.2 mg/hr patch Place 1 patch (0.2 mg total) onto the skin daily. 30 patch 4  . Olopatadine HCl (PATADAY) 0.2 % SOLN Place 1 drop into both eyes daily as needed (for allergies).     [name] oxyCODONE-acetaminophen (PERCOCET) 5-325 MG tablet Take 1 tablet by mouth every 4 (four) hours as needed. 30 tablet 0  . Continuous Blood Gluc Sensor (FREESTYLE LIBRE 14 DAY SENSOR) MISC USE AS DIRECTED EVERY 14 DAYS 2 each 0   No facility-administered medications prior to visit.    Allergies  Allergen Reactions  . Epoetin Alfa   . Ferumoxytol   . Morphine Sulfate Rash and Other (See Comments)    Itches all over, red spots    ROS Review of Systems  Constitutional: Negative for fatigue.  Eyes: Negative for visual disturbance.  Respiratory: Negative for cough, chest tightness and shortness of breath.     Cardiovascular: Negative for chest pain, palpitations and leg swelling.  Endocrine: Negative for polydipsia and polyuria.  Neurological: Negative for dizziness, syncope, weakness, light-headedness and headaches.      Objective:    Physical Exam Constitutional:      Appearance: Normal appearance.  Cardiovascular:     Rate and Rhythm: Normal rate and regular rhythm.  Pulmonary:     Effort: Pulmonary effort is normal.     Breath sounds: Normal breath sounds.  Skin:    Comments: His right foot is in a heavy bandage from recent surgery.  This was not removed  Neurological:     Mental Status: He is alert.     BP (!) 140/58 (BP Location: Right Arm, Cuff Size: Normal)   Pulse (!) 16   Temp 97.9 F (36.6 C) (Oral)   Resp 16   Ht _0  (1.778 m)   Wt 198 lb 14.4 oz (90.2 kg)   SpO2 94%   BMI 28.54 kg/m  Wt Readings from Last 3 Encounters:  10/16/19 198 lb 14.4 oz (90.2 kg)  10/09/19 202 lb (91.6 kg)  10/07/19 206 lb 3.2 oz (93.5 kg)     Health Maintenance Due  Topic Date Due  . COVID-19 Vaccine (2 - Moderna 2-dose series) 05/16/2019  . INFLUENZA VACCINE  08/11/2019    There are no preventive care reminders to display for this patient.  Lab Results  Component Value  Date   TSH 0.751 09/04/2019   Lab Results  Component Value Date   WBC 7.2 09/04/2019   HGB 8.9 (L) 09/04/2019   HCT 28.8 (L) 09/04/2019   MCV 88.3 09/04/2019   PLT 209 09/04/2019   Lab Results  Component Value Date   NA 134 (L) 09/04/2019   K 3.6 09/04/2019   CO2 27 09/04/2019   GLUCOSE 196 (H) 09/04/2019   BUN 31 (H) 09/04/2019   CREATININE 7.89 (H) 09/04/2019   BILITOT 0.6 09/04/2019   ALKPHOS 50 09/04/2019   AST 18 09/04/2019   ALT 14 09/04/2019   PROT 6.8 09/04/2019   ALBUMIN 2.6 (L) 09/04/2019   CALCIUM 8.7 (L) 09/04/2019   ANIONGAP 13 09/04/2019   GFR 12.30 (LL) 06/27/2014   Lab Results  Component Value Date   CHOL 158 05/08/2019   Lab Results  Component Value Date   HDL  37.00 (L) 05/08/2019   Lab Results  Component Value Date   LDLCALC 84 05/08/2019   Lab Results  Component Value Date   TRIG 187.0 (H) 05/08/2019   Lab Results  Component Value Date   CHOLHDL 4 05/08/2019   Lab Results  Component Value Date   HGBA1C 8.1 (H) 07/10/2019      Assessment & Plan:   #1 type 2 diabetes with multiple complications including associated chronic kidney disease and cardiac disease.  Longstanding history of poor control and poor compliance but recent improvement.  A1c is above 6.4%.  He had recent eye exam in July which was normal.  -Continue good dietary compliance -New prescription for Freestyle libre sensor -Flu vaccine given -Recommend Covid vaccine booster as soon as possible -We will plan routine follow-up in 4 months and sooner as needed  Meds ordered this encounter  Medications  . Continuous Blood Gluc Sensor (FREESTYLE LIBRE 14 DAY SENSOR) MISC    Sig: USE AS DIRECTED EVERY 14 DAYS    Dispense:  4 each    Refill:  11    Follow-up: Return in about 4 months (around 02/16/2020).    Carolann Littler, MD

## 2019-10-17 DIAGNOSIS — N186 End stage renal disease: Secondary | ICD-10-CM | POA: Diagnosis not present

## 2019-10-17 DIAGNOSIS — D631 Anemia in chronic kidney disease: Secondary | ICD-10-CM | POA: Diagnosis not present

## 2019-10-17 DIAGNOSIS — N2581 Secondary hyperparathyroidism of renal origin: Secondary | ICD-10-CM | POA: Diagnosis not present

## 2019-10-17 DIAGNOSIS — D509 Iron deficiency anemia, unspecified: Secondary | ICD-10-CM | POA: Diagnosis not present

## 2019-10-17 DIAGNOSIS — Z992 Dependence on renal dialysis: Secondary | ICD-10-CM | POA: Diagnosis not present

## 2019-10-19 DIAGNOSIS — N186 End stage renal disease: Secondary | ICD-10-CM | POA: Diagnosis not present

## 2019-10-19 DIAGNOSIS — D509 Iron deficiency anemia, unspecified: Secondary | ICD-10-CM | POA: Diagnosis not present

## 2019-10-19 DIAGNOSIS — N2581 Secondary hyperparathyroidism of renal origin: Secondary | ICD-10-CM | POA: Diagnosis not present

## 2019-10-19 DIAGNOSIS — D631 Anemia in chronic kidney disease: Secondary | ICD-10-CM | POA: Diagnosis not present

## 2019-10-19 DIAGNOSIS — Z992 Dependence on renal dialysis: Secondary | ICD-10-CM | POA: Diagnosis not present

## 2019-10-22 DIAGNOSIS — Z992 Dependence on renal dialysis: Secondary | ICD-10-CM | POA: Diagnosis not present

## 2019-10-22 DIAGNOSIS — D509 Iron deficiency anemia, unspecified: Secondary | ICD-10-CM | POA: Diagnosis not present

## 2019-10-22 DIAGNOSIS — N186 End stage renal disease: Secondary | ICD-10-CM | POA: Diagnosis not present

## 2019-10-22 DIAGNOSIS — D631 Anemia in chronic kidney disease: Secondary | ICD-10-CM | POA: Diagnosis not present

## 2019-10-22 DIAGNOSIS — N2581 Secondary hyperparathyroidism of renal origin: Secondary | ICD-10-CM | POA: Diagnosis not present

## 2019-10-23 ENCOUNTER — Other Ambulatory Visit: Payer: Self-pay

## 2019-10-23 MED ORDER — ALBUTEROL SULFATE HFA 108 (90 BASE) MCG/ACT IN AERS: 2.0000 | INHALATION_SPRAY | RESPIRATORY_TRACT | 1 refills | Status: DC | PRN

## 2019-10-24 DIAGNOSIS — N2581 Secondary hyperparathyroidism of renal origin: Secondary | ICD-10-CM | POA: Diagnosis not present

## 2019-10-24 DIAGNOSIS — Z992 Dependence on renal dialysis: Secondary | ICD-10-CM | POA: Diagnosis not present

## 2019-10-24 DIAGNOSIS — D631 Anemia in chronic kidney disease: Secondary | ICD-10-CM | POA: Diagnosis not present

## 2019-10-24 DIAGNOSIS — N186 End stage renal disease: Secondary | ICD-10-CM | POA: Diagnosis not present

## 2019-10-24 DIAGNOSIS — D509 Iron deficiency anemia, unspecified: Secondary | ICD-10-CM | POA: Diagnosis not present

## 2019-10-26 DIAGNOSIS — Z992 Dependence on renal dialysis: Secondary | ICD-10-CM | POA: Diagnosis not present

## 2019-10-26 DIAGNOSIS — N186 End stage renal disease: Secondary | ICD-10-CM | POA: Diagnosis not present

## 2019-10-26 DIAGNOSIS — D631 Anemia in chronic kidney disease: Secondary | ICD-10-CM | POA: Diagnosis not present

## 2019-10-26 DIAGNOSIS — D509 Iron deficiency anemia, unspecified: Secondary | ICD-10-CM | POA: Diagnosis not present

## 2019-10-26 DIAGNOSIS — N2581 Secondary hyperparathyroidism of renal origin: Secondary | ICD-10-CM | POA: Diagnosis not present

## 2019-10-29 DIAGNOSIS — N186 End stage renal disease: Secondary | ICD-10-CM | POA: Diagnosis not present

## 2019-10-29 DIAGNOSIS — N2581 Secondary hyperparathyroidism of renal origin: Secondary | ICD-10-CM | POA: Diagnosis not present

## 2019-10-29 DIAGNOSIS — D631 Anemia in chronic kidney disease: Secondary | ICD-10-CM | POA: Diagnosis not present

## 2019-10-29 DIAGNOSIS — Z992 Dependence on renal dialysis: Secondary | ICD-10-CM | POA: Diagnosis not present

## 2019-10-29 DIAGNOSIS — D509 Iron deficiency anemia, unspecified: Secondary | ICD-10-CM | POA: Diagnosis not present

## 2019-10-30 ENCOUNTER — Encounter: Payer: Self-pay | Admitting: Family

## 2019-10-30 ENCOUNTER — Telehealth: Payer: Self-pay

## 2019-10-30 ENCOUNTER — Ambulatory Visit (INDEPENDENT_AMBULATORY_CARE_PROVIDER_SITE_OTHER): Payer: Medicare Other | Admitting: Family

## 2019-10-30 VITALS — Ht 70.0 in | Wt 198.0 lb

## 2019-10-30 DIAGNOSIS — I739 Peripheral vascular disease, unspecified: Secondary | ICD-10-CM

## 2019-10-30 DIAGNOSIS — T8781 Dehiscence of amputation stump: Secondary | ICD-10-CM

## 2019-10-30 DIAGNOSIS — M869 Osteomyelitis, unspecified: Secondary | ICD-10-CM

## 2019-10-30 MED ORDER — DOXYCYCLINE HYCLATE 100 MG PO TABS: 100.0000 mg | ORAL_TABLET | Freq: Two times a day (BID) | ORAL | 0 refills | Status: DC

## 2019-10-30 NOTE — Addendum Note (Signed)
Addended by: Dondra Prader R on: 10/30/2019 03:45 PM   Modules accepted: Orders

## 2019-10-30 NOTE — Progress Notes (Signed)
Post-Op Visit Note   Patient: Anthony Bullock           Date of Birth: 12-Dec-1960           MRN: [id number] Visit Date: 10/30/2019 PCP: [name], MD  Chief Complaint:  Chief Complaint  Patient presents with  . Left Foot - Routine Post Op    08/21/19 left foot 5th ray amputation     HPI:  HPI The patient is a 59 year old gentleman seen status post left foot fifth ray amputation on August 21, 2019. Unfortunately had some dehiscence of his incision. has been doing daily Dial soap cleansing silvadene dressing changes. Has been full weight bearing in post op shoe with walker.    Ortho Exam Incision near full length dehiscence. Unfortunately despite improved healing proximally does have exposed bone distally, 4th Mt head exposed. No surrounding erythema, drainage or odor.   Visit Diagnoses:  No diagnosis found.  Plan: he will keep the incision clean and dry. Hoping can have urgent vascular surgery evaluation prior to next limb salvage surgery. Discussed may require 4th ray amputation vs. Transmetatarsal amputation.  Follow-Up Instructions: Return in about 4 weeks (around 11/27/2019).   Imaging: No results found.  Orders:  No orders of the defined types were placed in this encounter.  No orders of the defined types were placed in this encounter.    PMFS History: Patient Active Problem List   Diagnosis Date Noted  . History of complete ray amputation of fifth toe of left foot ([place]) 10/09/2019  . Acute pulmonary edema (HCC)   . Osteomyelitis of fifth toe of left foot ([place])   . Febrile illness 08/17/2019  . SIRS (systemic inflammatory response syndrome) ([place]) 08/17/2019  . Thrombocytopenia ([place]) 08/17/2019  . Pressure injury of skin 08/17/2019  . Hypertensive emergency 07/10/2019  . Onychomycosis 08/16/2016  . Status post unilateral below knee amputation, right ([place]) 04/26/2016  . Bilateral carotid bruits 03/30/2016  . Diabetes mellitus with complication ([place])   .  Anemia due to chronic kidney disease   . ESRD (end stage renal disease) on dialysis ([place]) 11/30/2015  . Acute respiratory failure with hypoxia ([place]) 11/29/2015  . At high risk for falls 09/29/2014  . Peripheral vascular disease ([place]) 09/29/2014  . Osteoarthritis of both knees 01/07/2014  . Osteoarthritis of multiple joints 10/06/2013  . NICM (nonischemic cardiomyopathy) ([place]) 06/27/2013  . CAD (coronary artery disease) 03/13/2013  . Abnormal nuclear stress test 01/27/2013  . Chronic systolic congestive heart failure ([place]) 01/27/2013  . COPD (chronic obstructive pulmonary disease) ([place]) 12/27/2012  . Diabetic peripheral neuropathy ([place]) 02/10/2012  . URINARY CALCULUS 09/28/2009  . Adjustment disorder with depressed mood 08/21/2009  . Poorly controlled type II diabetes mellitus with renal complication ([place]) 83/38/2505  . Hyperlipidemia 04/14/2008  . Essential hypertension 04/14/2008  . ALLERGIC RHINITIS 04/14/2008   Past Medical History:  Diagnosis Date  . 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 Allergic rhinitis   . Anemia   . Arthritis   . Chronic systolic heart failure ([place])    a. ECHO (12/2012) EF 25-30%, HK entireanteroseptal myocardium //  b.  EF 25%, diffuse HK, grade 1 diastolic dysfunction, MAC, mild LAE, normal RVSF, trivial pericardial effusion  . COPD (chronic obstructive pulmonary disease) ([place])   . Diabetes mellitus type II   . Diabetic nephropathy ([place])   . Diabetic neuropathy ([place])   . ESRD on hemodialysis [facility])    started HD June 2017, goes  to [facility] HD unit, Dr Hinda Lenis  . History of cardiac catheterization    a.Myoview 1/15:  There is significant left ventricular dysfunction. There may be slight scar at the apex. There is no significant ischemia. LV Ejection Fraction: 27%  //  b. RHC/LHC (1/15) with mean RA 6, PA 47/22 mean 33, mean PCWP 20, PVR 2.5 WU, CI 2.5; 80% dLAD stenosis, 70% diffuse large D.   . History of kidney  stones   . Hyperlipidemia   . Hypertension   . Kidney stones   . NICM (nonischemic cardiomyopathy) ([place])    Primarily nonischemic. Echo (12/14) with EF 25-30%. Echo (3/15) with EF 25%, mild to moderately dilated LV, normal RV size and systolic function.   . Osteomyelitis ([place])    left fifth ray  . Pneumonia   . Urethral stricture   . Wears glasses     Family History  Problem Relation Age of Onset  . Bladder Cancer Mother   . Alcohol abuse Father   . Melanoma Father   . Stroke Maternal Grandmother   . Heart Problems Maternal Grandmother        unknown  . Diabetes Maternal Grandmother   . Heart disease Maternal Grandfather   . Prostate cancer Maternal Grandfather     Past Surgical History:  Procedure Laterality Date  . ABDOMINAL AORTOGRAM W/LOWER EXTREMITY N/A 03/30/2016   Procedure: Abdominal Aortogram w/Lower Extremity;  Surgeon: Angelia Mould, MD;  Location: [place] [facility];  Service: Cardiovascular;  Laterality: N/A;  . AMPUTATION Right 04/26/2016   Procedure: Right Below Knee Amputation;  Surgeon: Newt Minion, MD;  Location: [place];  Service: Orthopedics;  Laterality: Right;  . AMPUTATION Left 08/21/2019   Procedure: LEFT FOOT 5TH RAY AMPUTATION;  Surgeon: Newt Minion, MD;  Location: [place];  Service: Orthopedics;  Laterality: Left;  . AV FISTULA PLACEMENT Right 09/08/2015   Procedure: INSERTION OF 4-38m x 45cm  ARTERIOVENOUS (AV) GORE-TEX GRAFT RIGHT UPPER  ARM;  Surgeon: CAngelia Mould MD;  Location: [place]  Service: Vascular;  Laterality: Right;  . AV FISTULA PLACEMENT Left 01/14/2016   Procedure: CREATION OF LEFT UPPER ARM ARTERIOVENOUS FISTULA;  Surgeon: CAngelia Mould MD;  [place]  Service: Vascular;  Laterality: Left;  . BASCILIC VEIN TRANSPOSITION Right 08/22/2014   Procedure: RIGHT UPPER ARM BASCILIC VEIN TRANSPOSITION;  Surgeon: CAngelia Mould MD;  [place]  Service: Vascular;  Laterality: Right;  . BELOW KNEE LEG  AMPUTATION Right 04/26/2016  . CARDIAC CATHETERIZATION    . CARDIAC DEFIBRILLATOR PLACEMENT  06/27/2013   Sub Q       BY DR KCaryl Comes . CATARACT EXTRACTION W/PHACO Right 08/06/2018   Procedure: CATARACT EXTRACTION PHACO AND INTRAOCULAR LENS PLACEMENT (IOC);  Surgeon: WBaruch Goldmann MD;  Location: [facility];  Service: Ophthalmology;  Laterality: Right;  CDE: 4.06  . CATARACT EXTRACTION W/PHACO Left 08/20/2018   Procedure: CATARACT EXTRACTION PHACO AND INTRAOCULAR LENS PLACEMENT (IOC);  Surgeon: WBaruch Goldmann MD;  Location: [facility];  Service: Ophthalmology;  Laterality: Left;  CDE: 6.76  . COLONOSCOPY WITH PROPOFOL N/A 07/22/2015   Procedure: COLONOSCOPY WITH PROPOFOL;  Surgeon: HDoran Stabler MD;  Location: [facility];  Service: Gastroenterology;  Laterality: N/A;  . FEMORAL-POPLITEAL BYPASS GRAFT Right 03/31/2016   Procedure: BYPASS GRAFT FEMORAL-POPLITEAL ARTERY USING RIGHT GREATER SAPHENOUS NONREVERSED VEIN;  Surgeon: CAngelia Mould MD;  Location[place]  Service: Vascular;  Laterality: Right;  . HERNIA REPAIR    .  I & D EXTREMITY Right 03/31/2016   Procedure: IRRIGATION AND DEBRIDEMENT FOOT;  Surgeon: Angelia Mould, MD;  Location: [place];  Service: Vascular;  Laterality: Right;  . IMPLANTABLE CARDIOVERTER DEFIBRILLATOR IMPLANT N/A 06/27/2013   Procedure: SUB Q ICD;  Surgeon: Deboraha Sprang, MD;  Location: [facility] CATH LAB;  Service: Cardiovascular;  Laterality: N/A;  . INTRAOPERATIVE ARTERIOGRAM Right 03/31/2016   Procedure: INTRA OPERATIVE ARTERIOGRAM;  Surgeon: Angelia Mould, MD;  Location: [place];  Service: Vascular;  Laterality: Right;  . IR GENERIC HISTORICAL Right 11/30/2015   IR THROMBECTOMY AV FISTULA W/THROMBOLYSIS/PTA INC/SHUNT/IMG RIGHT 11/30/2015 Aletta Edouard, MD [facility]  . IR GENERIC HISTORICAL  11/30/2015   IR US GUIDE VASC ACCESS RIGHT 11/30/2015 Aletta Edouard, MD [facility]  . IR GENERIC HISTORICAL Right 12/15/2015   IR THROMBECTOMY AV FISTULA  W/THROMBOLYSIS/PTA/STENT INC/SHUNT/IMG RT 12/15/2015 Arne Cleveland, MD [facility]  . IR GENERIC HISTORICAL  12/15/2015   IR US GUIDE VASC ACCESS RIGHT 12/15/2015 Arne Cleveland, MD [facility]  . IR GENERIC HISTORICAL  12/28/2015   IR FLUORO GUIDE CV LINE RIGHT 12/28/2015 Marybelle Killings, MD [facility]  . IR GENERIC HISTORICAL  12/28/2015   IR US GUIDE VASC ACCESS RIGHT 12/28/2015 Marybelle Killings, MD [facility]  . LEFT A ND RIGHT HEART CATH  01/30/2013   DR Sung Amabile  . LEFT AND RIGHT HEART CATHETERIZATION WITH CORONARY ANGIOGRAM N/A 01/30/2013   Procedure: LEFT AND RIGHT HEART CATHETERIZATION WITH CORONARY ANGIOGRAM;  Surgeon: Jolaine Artist, MD;  Location: [facility] CATH LAB;  Service: Cardiovascular;  Laterality: N/A;  . PERIPHERAL VASCULAR CATHETERIZATION Right 01/26/2015   Procedure: A/V Fistulagram;  Surgeon: Angelia Mould, MD;  Location: [place] CV LAB;  Service: Cardiovascular;  Laterality: Right;  . reapea urethral surgery for recurrent obstruction  2011  . TOTAL KNEE ARTHROPLASTY Right 2007  . VEIN HARVEST Right 03/31/2016   Procedure: RIGHT GREATER SAPHENOUS VEIN HARVEST;  Surgeon: Angelia Mould, MD;  Location: [place];  Service: Vascular;  Laterality: Right;   Social History   Occupational History  . Not on file  Tobacco Use  . Smoking status: Former Smoker    Packs/day: 2.00    Years: 32.00    Pack years: 64.00    Types: Cigarettes    Quit date: 05/11/2010    Years since quitting: 9.4  . Smokeless tobacco: Never Used  Vaping Use  . Vaping Use: Never used  Substance and Sexual Activity  . Alcohol use: No  . Drug use: No  . Sexual activity: Yes

## 2019-10-30 NOTE — Telephone Encounter (Signed)
Patient called he need prescription to be sent in for antibiotics to the [facility] that's on file. Call back:[phone]

## 2019-10-30 NOTE — Telephone Encounter (Signed)
You saw the pt this morning did you discuss putting him on ABX?

## 2019-10-31 DIAGNOSIS — N186 End stage renal disease: Secondary | ICD-10-CM | POA: Diagnosis not present

## 2019-10-31 DIAGNOSIS — D631 Anemia in chronic kidney disease: Secondary | ICD-10-CM | POA: Diagnosis not present

## 2019-10-31 DIAGNOSIS — N2581 Secondary hyperparathyroidism of renal origin: Secondary | ICD-10-CM | POA: Diagnosis not present

## 2019-10-31 DIAGNOSIS — Z992 Dependence on renal dialysis: Secondary | ICD-10-CM | POA: Diagnosis not present

## 2019-10-31 DIAGNOSIS — D509 Iron deficiency anemia, unspecified: Secondary | ICD-10-CM | POA: Diagnosis not present

## 2019-11-01 ENCOUNTER — Telehealth: Payer: Self-pay | Admitting: Family Medicine

## 2019-11-01 ENCOUNTER — Other Ambulatory Visit: Payer: Self-pay | Admitting: *Deleted

## 2019-11-01 DIAGNOSIS — I739 Peripheral vascular disease, unspecified: Secondary | ICD-10-CM

## 2019-11-01 NOTE — Progress Notes (Signed)
Opened in error

## 2019-11-01 NOTE — Telephone Encounter (Signed)
Patient would like a paper showing that he got his flu shot.  I printed the patients Immunization record and mailed the copy to the patient.  Nothing further needed

## 2019-11-02 DIAGNOSIS — N2581 Secondary hyperparathyroidism of renal origin: Secondary | ICD-10-CM | POA: Diagnosis not present

## 2019-11-02 DIAGNOSIS — D631 Anemia in chronic kidney disease: Secondary | ICD-10-CM | POA: Diagnosis not present

## 2019-11-02 DIAGNOSIS — N186 End stage renal disease: Secondary | ICD-10-CM | POA: Diagnosis not present

## 2019-11-02 DIAGNOSIS — D509 Iron deficiency anemia, unspecified: Secondary | ICD-10-CM | POA: Diagnosis not present

## 2019-11-02 DIAGNOSIS — Z992 Dependence on renal dialysis: Secondary | ICD-10-CM | POA: Diagnosis not present

## 2019-11-04 ENCOUNTER — Ambulatory Visit (INDEPENDENT_AMBULATORY_CARE_PROVIDER_SITE_OTHER): Payer: Medicare Other | Admitting: Surgery

## 2019-11-04 ENCOUNTER — Encounter: Payer: Self-pay | Admitting: Surgery

## 2019-11-04 ENCOUNTER — Other Ambulatory Visit: Payer: Self-pay

## 2019-11-04 ENCOUNTER — Other Ambulatory Visit (HOSPITAL_COMMUNITY): Payer: Self-pay | Admitting: Family

## 2019-11-04 ENCOUNTER — Ambulatory Visit (HOSPITAL_COMMUNITY)
Admission: RE | Admit: 2019-11-04 | Discharge: 2019-11-04 | Disposition: A | Discharge status: Home / Self Care | Payer: Medicare Other | Source: Ambulatory Visit | Attending: Orthopedic Surgery | Admitting: Orthopedic Surgery

## 2019-11-04 VITALS — BP 151/70 | HR 70 | Temp 98.0°F | Resp 20 | Ht 70.0 in | Wt 195.0 lb

## 2019-11-04 DIAGNOSIS — I6523 Occlusion and stenosis of bilateral carotid arteries: Secondary | ICD-10-CM

## 2019-11-04 DIAGNOSIS — I7025 Atherosclerosis of native arteries of other extremities with ulceration: Secondary | ICD-10-CM

## 2019-11-04 DIAGNOSIS — M86179 Other acute osteomyelitis, unspecified ankle and foot: Secondary | ICD-10-CM | POA: Insufficient documentation

## 2019-11-04 NOTE — Progress Notes (Signed)
[facility] [place]  Patient name: Anthony Bullock MRN: [id number] DOB: [birth year] Sex: male   REQUESTING PROVIDER:   Dr. Sharol Given   REASON FOR CONSULT:    Nonhealing left foot amputation  HISTORY OF PRESENT ILLNESS:   Anthony Bullock is a 59 y.o. male, who is referred for evaluation of his vascular disease in the setting of a nonhealing foot amputation on the left.  The patient has a history of a similar issue on the right and underwent right femoral-popliteal bypass grafting but ultimately ended up with an amputation.  He has exposed bone on the left at the site of 1/5 toe amputation site.  This was done in August and has not healed.    The patient suffers from type 2 diabetes.  He has a defibrillator in place.  He is a former smoker with COPD.  He is on dialysis.  He is medically managed for hypertension.  He takes a statin for hypercholesterolemia  PAST MEDICAL HISTORY    Past Medical History:  Diagnosis Date  . 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 Allergic rhinitis   . Anemia   . Arthritis   . Chronic systolic heart failure ([place])    a. ECHO (12/2012) EF 25-30%, HK entireanteroseptal myocardium //  b.  EF 25%, diffuse HK, grade 1 diastolic dysfunction, MAC, mild LAE, normal RVSF, trivial pericardial effusion  . COPD (chronic obstructive pulmonary disease) ([place])   . Diabetes mellitus type II   . Diabetic nephropathy ([place])   . Diabetic neuropathy ([place])   . ESRD on hemodialysis [facility])    started HD June 2017, goes to [facility] HD unit, Dr Hinda Lenis  . History of cardiac catheterization    a.Myoview 1/15:  There is significant left ventricular dysfunction. There may be slight scar at the apex. There is no significant ischemia. LV Ejection Fraction: 27%  //  b. RHC/LHC (1/15) with mean RA 6, PA 47/22 mean 33, mean PCWP 20, PVR 2.5 WU, CI 2.5; 80% dLAD stenosis, 70% diffuse large D.   .  History of kidney stones   . Hyperlipidemia   . Hypertension   . Kidney stones   . NICM (nonischemic cardiomyopathy) ([place])    Primarily nonischemic. Echo (12/14) with EF 25-30%. Echo (3/15) with EF 25%, mild to moderately dilated LV, normal RV size and systolic function.   . Osteomyelitis ([place])    left fifth ray  . Pneumonia   . Urethral stricture   . Wears glasses      FAMILY HISTORY   Family History  Problem Relation Age of Onset  . Bladder Cancer Mother   . Alcohol abuse Father   . Melanoma Father   . Stroke Maternal Grandmother   . Heart Problems Maternal Grandmother        unknown  . Diabetes Maternal Grandmother   . Heart disease Maternal Grandfather   . Prostate cancer Maternal Grandfather     SOCIAL HISTORY:   Social History   Socioeconomic History  . Marital status: Married    Spouse name: Not on file  . Number of children: 0  . Years of education: Not on file  . Highest education level: Not on file  Occupational History  . Not on file  Tobacco Use  . Smoking status: Former Smoker    Packs/day: 2.00    Years: 32.00    Pack years: 64.00    Types: Cigarettes    Quit  date: 05/11/2010    Years since quitting: 9.4  . Smokeless tobacco: Never Used  Vaping Use  . Vaping Use: Never used  Substance and Sexual Activity  . Alcohol use: No  . Drug use: No  . Sexual activity: Yes  Other Topics Concern  . 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 Difficulty of Paying Living Expenses: Not on file  Food Insecurity:   . Worried About Charity fundraiser in the Last Year: Not on file  . Ran Out of Food in the Last Year: Not on file  Transportation Needs:   . Lack of Transportation (Medical): Not on file  . Lack of Transportation (Non-Medical): Not on file  Physical Activity:   . Days of Exercise per Week: Not on file  . Minutes of Exercise per Session: Not on  file  Stress:   . Feeling of Stress : Not on file  Social Connections:   . Frequency of Communication with Friends and Family: Not on file  . Frequency of Social Gatherings with Friends and Family: Not on file  . Attends Religious Services: Not on file  . Active Member of Clubs or Organizations: Not on file  . Attends Archivist Meetings: Not on file  . Marital Status: Not on file  Intimate Partner Violence:   . Fear of Current or Ex-Partner: Not on file  . Emotionally Abused: Not on file  . Physically Abused: Not on file  . Sexually Abused: Not on file    ALLERGIES:    Allergies  Allergen Reactions  . Epoetin Alfa   . Ferumoxytol   . Morphine Sulfate Rash and Other (See Comments)    Itches all over, red spots    CURRENT MEDICATIONS:    Current Outpatient Medications  Medication Sig Dispense Refill  . albuterol (VENTOLIN HFA) 108 (90 Base) MCG/ACT inhaler Inhale 2 puffs into the lungs every 4 (four) hours as needed for wheezing or shortness of breath. 1 each 1  . amLODipine (NORVASC) 10 MG tablet Take 1 tablet (10 mg total) by mouth daily. 90 tablet 2  . aspirin EC 81 MG tablet Take 81 mg by mouth daily.     [name] atorvastatin (LIPITOR) 80 MG tablet Take 1 tablet (80 mg total) by mouth at bedtime. 90 tablet 3  . azelastine (ASTELIN) 0.1 % nasal spray Place 2 sprays into both nostrils 3 (three) times daily as needed for rhinitis. Use in each nostril as directed 30 mL 12  . carvedilol (COREG) 25 MG tablet Take 1 tablet (25 mg total) by mouth 2 (two) times daily. 180 tablet 2  . Continuous Blood Gluc Sensor (FREESTYLE LIBRE 14 DAY SENSOR) MISC USE AS DIRECTED EVERY 14 DAYS 4 each 11  . doxycycline (VIBRA-TABS) 100 MG tablet Take 1 tablet (100 mg total) by mouth 2 (two) times daily. 28 tablet 0  . fenofibrate 160 MG tablet Take 1 tablet (160 mg total) by mouth daily. 90 tablet 3  . fexofenadine (ALLEGRA) 180 MG tablet Take 180 mg by mouth daily.    . furosemide (LASIX) 40  MG tablet Take 3 tablets by mouth once daily 270 tablet 0  . gabapentin (NEURONTIN) 300 MG capsule Take 2 capsules (600 mg total) by mouth 2 (two) times daily. 360 capsule 3  . glucose blood test strip Check 1 time daily. E11.9 One  Touch Ultra Blue Test Strips 100 each 3  . Insulin Glargine (BASAGLAR KWIKPEN) 100 UNIT/ML INJECT 70 UNITS SUBCUTANEOUSLY AT BEDTIME 15 mL 11  . Insulin Lispro (HUMALOG KWIKPEN) 200 UNIT/ML SOPN Inject 10 Units into the skin 3 (three) times daily with meals. Use tid with meals as per sliding scale 4 pen 6  . Insulin Pen Needle (BD PEN NEEDLE NANO U/F) 32G X 4 MM MISC USE 1 PEN NEEDLE SUBCUTANEOUSLY WITH INSULIN 4 TIMES DAILY 400 each 0  . lanthanum (FOSRENOL) 1000 MG chewable tablet Chew 1,000 mg by mouth 3 (three) times daily with meals. Take one tablet after each meal.     . multivitamin (RENA-VIT) TABS tablet Take 1 tablet by mouth daily. 90 tablet 3  . nitroGLYCERIN (NITRODUR - DOSED IN MG/24 HR) 0.2 mg/hr patch Place 1 patch (0.2 mg total) onto the skin daily. 30 patch 4  . Olopatadine HCl (PATADAY) 0.2 % SOLN Place 1 drop into both eyes daily as needed (for allergies).     [name] oxyCODONE-acetaminophen (PERCOCET) 5-325 MG tablet Take 1 tablet by mouth every 4 (four) hours as needed. 30 tablet 0   No current facility-administered medications for this visit.    REVIEW OF SYSTEMS:   _0  denotes positive finding, _1  denotes negative finding Cardiac  Comments:  Chest pain or chest pressure:    Shortness of breath upon exertion:    Short of breath when lying flat:    Irregular heart rhythm:        Vascular    Pain in calf, thigh, or hip brought on by ambulation:    Pain in feet at night that wakes you up from your sleep:     Blood clot in your veins:    Leg swelling:         Pulmonary    Oxygen at home:    Productive cough:     Wheezing:         Neurologic    Sudden weakness in arms or legs:     Sudden numbness in arms or legs:     Sudden onset of  difficulty speaking or slurred speech:    Temporary loss of vision in one eye:     Problems with dizziness:         Gastrointestinal    Blood in stool:      Vomited blood:         Genitourinary    Burning when urinating:     Blood in urine:        Psychiatric    Major depression:         Hematologic    Bleeding problems:    Problems with blood clotting too easily:        Skin    Rashes or ulcers: x       Constitutional    Fever or chills:     PHYSICAL EXAM:   Vitals:   11/04/19 1534  BP: (!) 151/70  Pulse: 70  Resp: 20  Temp: 98 F (36.7 C)  SpO2: 97%  Weight: 195 lb (88.5 kg)  Height: _2  (1.778 m)    GENERAL: The patient is a well-nourished male, in no acute distress. The vital signs are documented above. CARDIAC: There is a regular rate and rhythm.  VASCULAR: Nonpalpable pedal pulses on the left PULMONARY: Nonlabored respirations ABDOMEN: Soft and non-tender with normal pitched bowel sounds.  MUSCULOSKELETAL: There are no major deformities or cyanosis. NEUROLOGIC: No focal weakness  or paresthesias are detected. SKIN: Nonhealing left foot amputation with exposed bone PSYCHIATRIC: The patient has a normal affect.  STUDIES:   I have reviewed the following studies: ABI/TBIToday's ABIToday's TBIPrevious ABIPrevious TBI  +-------+-----------+-----------+------------+------------+  Right AKA          [place]             +-------+-----------+-----------+------------+------------+  Left  [place]     0.15    [place]             +-------+-----------+-----------+------------+------------+  Left toe pressure is 25 ASSESSMENT and PLAN   Atherosclerotic vascular disease with ulceration and nonhealing amputation site: The patient's toe pressure on the left is 25.  I have reviewed his old arteriogram which stops above the knee however shows stenosis within the superficial femoral and popliteal artery.  He does not have  adequate blood flow to heal any procedure done on the foot.  I have recommended proceeding with angiography and intervention, however he would like to meet with Dr. Sharol Given this Thursday to determine the level of amputation as he does not want to undergo revascularization if he is going to end up with a below-knee amputation.  This seems reasonable.  I told him to contact me if the plans are to try and save the foot.  I will schedule his arteriogram shortly thereafter once I hear from them.   Leia Alf, MD, FACS [facility] [phone] Pager [phone]

## 2019-11-05 DIAGNOSIS — Z992 Dependence on renal dialysis: Secondary | ICD-10-CM | POA: Diagnosis not present

## 2019-11-05 DIAGNOSIS — N186 End stage renal disease: Secondary | ICD-10-CM | POA: Diagnosis not present

## 2019-11-05 DIAGNOSIS — D509 Iron deficiency anemia, unspecified: Secondary | ICD-10-CM | POA: Diagnosis not present

## 2019-11-05 DIAGNOSIS — D631 Anemia in chronic kidney disease: Secondary | ICD-10-CM | POA: Diagnosis not present

## 2019-11-05 DIAGNOSIS — N2581 Secondary hyperparathyroidism of renal origin: Secondary | ICD-10-CM | POA: Diagnosis not present

## 2019-11-06 ENCOUNTER — Ambulatory Visit: Payer: Medicare Other | Admitting: Family

## 2019-11-07 ENCOUNTER — Ambulatory Visit (INDEPENDENT_AMBULATORY_CARE_PROVIDER_SITE_OTHER): Payer: Medicare Other | Admitting: Orthopedic Surgery

## 2019-11-07 ENCOUNTER — Other Ambulatory Visit: Payer: Self-pay

## 2019-11-07 DIAGNOSIS — D509 Iron deficiency anemia, unspecified: Secondary | ICD-10-CM | POA: Diagnosis not present

## 2019-11-07 DIAGNOSIS — T8781 Dehiscence of amputation stump: Secondary | ICD-10-CM

## 2019-11-07 DIAGNOSIS — D631 Anemia in chronic kidney disease: Secondary | ICD-10-CM | POA: Diagnosis not present

## 2019-11-07 DIAGNOSIS — N186 End stage renal disease: Secondary | ICD-10-CM | POA: Diagnosis not present

## 2019-11-07 DIAGNOSIS — N2581 Secondary hyperparathyroidism of renal origin: Secondary | ICD-10-CM | POA: Diagnosis not present

## 2019-11-07 DIAGNOSIS — Z992 Dependence on renal dialysis: Secondary | ICD-10-CM | POA: Diagnosis not present

## 2019-11-08 ENCOUNTER — Other Ambulatory Visit: Payer: Self-pay | Admitting: Physician Assistant

## 2019-11-09 DIAGNOSIS — D631 Anemia in chronic kidney disease: Secondary | ICD-10-CM | POA: Diagnosis not present

## 2019-11-09 DIAGNOSIS — N186 End stage renal disease: Secondary | ICD-10-CM | POA: Diagnosis not present

## 2019-11-09 DIAGNOSIS — Z992 Dependence on renal dialysis: Secondary | ICD-10-CM | POA: Diagnosis not present

## 2019-11-09 DIAGNOSIS — D509 Iron deficiency anemia, unspecified: Secondary | ICD-10-CM | POA: Diagnosis not present

## 2019-11-09 DIAGNOSIS — N2581 Secondary hyperparathyroidism of renal origin: Secondary | ICD-10-CM | POA: Diagnosis not present

## 2019-11-10 DIAGNOSIS — Z992 Dependence on renal dialysis: Secondary | ICD-10-CM | POA: Diagnosis not present

## 2019-11-10 DIAGNOSIS — N186 End stage renal disease: Secondary | ICD-10-CM | POA: Diagnosis not present

## 2019-11-11 ENCOUNTER — Other Ambulatory Visit (HOSPITAL_COMMUNITY)
Admission: RE | Admit: 2019-11-11 | Discharge: 2019-11-11 | Disposition: A | Discharge status: Home / Self Care | Payer: Medicare Other | Source: Ambulatory Visit | Attending: Orthopedic Surgery | Admitting: Orthopedic Surgery

## 2019-11-11 DIAGNOSIS — Z20822 Contact with and (suspected) exposure to covid-19: Secondary | ICD-10-CM | POA: Insufficient documentation

## 2019-11-11 DIAGNOSIS — Z01812 Encounter for preprocedural laboratory examination: Secondary | ICD-10-CM | POA: Insufficient documentation

## 2019-11-12 ENCOUNTER — Encounter: Payer: Self-pay | Admitting: Internal Medicine

## 2019-11-12 ENCOUNTER — Encounter (HOSPITAL_COMMUNITY): Payer: Self-pay | Admitting: Orthopedic Surgery

## 2019-11-12 ENCOUNTER — Other Ambulatory Visit: Payer: Self-pay

## 2019-11-12 DIAGNOSIS — N186 End stage renal disease: Secondary | ICD-10-CM | POA: Diagnosis not present

## 2019-11-12 DIAGNOSIS — D631 Anemia in chronic kidney disease: Secondary | ICD-10-CM | POA: Diagnosis not present

## 2019-11-12 DIAGNOSIS — Z992 Dependence on renal dialysis: Secondary | ICD-10-CM | POA: Diagnosis not present

## 2019-11-12 DIAGNOSIS — D509 Iron deficiency anemia, unspecified: Secondary | ICD-10-CM | POA: Diagnosis not present

## 2019-11-12 DIAGNOSIS — N2581 Secondary hyperparathyroidism of renal origin: Secondary | ICD-10-CM | POA: Diagnosis not present

## 2019-11-12 LAB — SARS CORONAVIRUS 2 (TAT 6-24 HRS): SARS Coronavirus 2: NEGATIVE

## 2019-11-12 NOTE — Progress Notes (Signed)
PCP - Dr. Carolann Littler Cardiologist - Dr. Harl Bowie and Dr. Caryl Comes    PPM/ICD - ICD  Device Orders - yes Rep Notified - Joey notified   Chest x-ray - 09/03/19 EKG - 09/04/19 Stress Test - per pt 2018 ECHO - 09/03/19 Cardiac Cath - pt denies  Fasting Blood Sugar - 180 Checks Blood Sugar 3x times a day  Blood Thinner Instructions: n/a Aspirin Instructions: Follow your surgeon's instructions on when to stop Aspirin.  If no instructions were given by your surgeon then you will need to call the office to get those instructions.    ERAS Protcol - yes clears until 10am  COVID TEST- 11/1 negative    Anesthesia review: yes   -------------  SDW INSTRUCTIONS:  Your procedure is scheduled on Wednesday 11/13/19.  Report to [facility] Main Entrance "A" at 10:30 A.M., and check in at the Admitting office.  Call this number if you have problems the morning of surgery: [phone]   Remember: Do not eat after midnight the night before your surgery  You may drink clear liquids until 10:00 the morning of your surgery.   Clear liquids allowed are: Water, Non-Citrus Juices (without pulp), Carbonated Beverages, Clear Tea, Black Coffee Only, and Gatorade   Take these medicines the morning of surgery with A SIP OF WATER: albuterol (VENTOLIN HFA) if needed --- Please bring all inhalers with you the day of surgery.  amLODipine (NORVASC)  carvedilol (COREG) doxycycline (VIBRA-TABS)  fenofibrate  fexofenadine (ALLEGRA) gabapentin (NEURONTIN)  Eye drops  Insulin Glargine (BASAGLAR KWIKPEN)  11/2: AM dose take as usual, PM dose half of usual dose  11/3 day of surgery: half of usual dose   Insulin Lispro (HUMALOG KWIKPEN) 11/2: AM take as usual, no PM dose 11/3 day of surgery: NONE - if CBG >220 take half of usual correction dose   ** PLEASE check your blood sugar the morning of your surgery when you wake up and every 2 hours until you get to the Short Stay unit.  If your blood sugar is  less than 70 mg/dL, you will need to treat for low blood sugar: - Do not take insulin. - Treat a low blood sugar (less than 70 mg/dL) with  cup of clear juice (cranberry or apple), 4 glucose tablets, OR glucose gel. - Recheck blood sugar in 15 minutes after treatment (to make sure it is greater than 70 mg/dL). If your blood sugar is not greater than 70 mg/dL on recheck, call [phone] for further instructions.   Follow your surgeon's instructions on when to stop Aspirin.  If no instructions were given by your surgeon then you will need to call the office to get those instructions.    As of today, STOP taking any Aspirin (unless otherwise instructed by your surgeon), Aleve, Naproxen, Ibuprofen, Motrin, Advil, Goody's, BC's, all herbal medications, fish oil, and all vitamins.    The Morning of Surgery  Do not wear jewelry  Do not wear lotions, powders, colognes, or deodorant Men may shave face and neck.  Do not bring valuables to the hospital.  [facility] is not responsible for any belongings or valuables.  If you are a smoker, DO NOT Smoke 24 hours prior to surgery  If you wear a CPAP at night please bring your mask the morning of surgery   Remember that you must have someone to transport you home after your surgery, and remain with you for 24 hours if you are discharged the same day.  Please bring cases for contacts, glasses, hearing aids, dentures or bridgework because it cannot be worn into surgery.    Leave your suitcase in the car.  After surgery it may be brought to your room.  For patients admitted to the hospital, discharge time will be determined by your treatment team.  Patients discharged the day of surgery will not be allowed to drive home.    Special instructions:   [facility]- Preparing For Surgery  Oral Hygiene is also important to reduce your risk of infection.  Remember - BRUSH YOUR TEETH THE MORNING OF SURGERY WITH YOUR REGULAR TOOTHPASTE  Please follow  these instructions carefully.   1. Shower the NIGHT BEFORE SURGERY and the MORNING OF SURGERY with DIAL Soap.   2. Wash thoroughly, paying special attention to the area where your surgery will be performed.  3. Thoroughly rinse your body with warm water from the neck down.  4. Pat yourself dry with a CLEAN TOWEL.  5. Wear CLEAN PAJAMAS to bed the night before surgery  6. Place CLEAN SHEETS on your bed the night of your first shower and DO NOT SLEEP WITH PETS.  7. Wear comfortable clothes the morning of surgery.    Day of Surgery:  Please shower the morning of surgery with the DIAL soap Do not apply any deodorants/lotions. Please wear clean clothes to the hospital/surgery center.   Remember to brush your teeth WITH YOUR REGULAR TOOTHPASTE.   Please read over the following fact sheets that you were given.  Patient denies shortness of breath, fever, cough and chest pain.

## 2019-11-12 NOTE — Anesthesia Preprocedure Evaluation (Addendum)
Anesthesia Evaluation  Patient identified by MRN, date of birth, ID band Patient awake    Reviewed: Allergy & Precautions, NPO status , Patient's Chart, lab work & pertinent test results  Airway Mallampati: II  TM Distance: >3 FB Neck ROM: Full    Dental  (+) Dental Advisory Given   Pulmonary COPD, former smoker,    Pulmonary exam normal breath sounds clear to auscultation       Cardiovascular hypertension, + CAD, + Peripheral Vascular Disease and +CHF  Normal cardiovascular exam+ Cardiac Defibrillator  Rhythm:Regular Rate:Normal     Neuro/Psych PSYCHIATRIC DISORDERS Depression  Neuromuscular disease    GI/Hepatic negative GI ROS, Neg liver ROS,   Endo/Other  diabetes  Renal/GU ESRF and DialysisRenal disease  negative genitourinary   Musculoskeletal  (+) Arthritis ,   Abdominal Normal abdominal exam  (+)   Peds  Hematology  (+) anemia ,   Anesthesia Other Findings   Reproductive/Obstetrics                           Anesthesia Physical Anesthesia Plan  ASA: IV  Anesthesia Plan: Regional and MAC   Post-op Pain Management:  Regional for Post-op pain   Induction: Intravenous  PONV Risk Score and Plan: 1 and Propofol infusion, TIVA and Treatment may vary due to age or medical condition  Airway Management Planned: Natural Airway and Simple Face Mask  Additional Equipment:   Intra-op Plan:   Post-operative Plan:   Informed Consent: I have reviewed the patients History and Physical, chart, labs and discussed the procedure including the risks, benefits and alternatives for the proposed anesthesia with the patient or authorized representative who has indicated his/her understanding and acceptance.       Plan Discussed with: CRNA and Anesthesiologist  Anesthesia Plan Comments: (PAT note written 11/12/2019 by Myra Gianotti, PA-C. )       Anesthesia Quick Evaluation

## 2019-11-12 NOTE — Progress Notes (Signed)
Anesthesia Chart Review:  Case: [id number] Date/Time: 11/13/19 1242   Procedure: LEFT BELOW KNEE AMPUTATION (Left Knee)   Anesthesia type: Choice   Pre-op diagnosis: Osteomyelitis Left Foot   Location: [facility] OR ROOM 03 / [place] OR   Surgeons: Newt Minion, MD      DISCUSSION: Patient is a 59 year old male scheduled for the above procedure.  History includes former smoker (quit 01/17/54), chronic systolic heart failure/NICM, ICD (Subcutaneous [employer] ICD implant 06/27/13), CAD (cath 2015 with diffuse nonobstructive disease, worst lesion 80% small distal LAD), DM2, ESRD ([place]), HTN, HLD, urethral stricture (s/p multiple urethral dilations), carotid stenosis (1-39% BICA 11/29/17), COPD, anemia, PVD (s/p right CFA-popliteal bypass 03/31/16; right transtibial amputation 04/26/16; left 5th toe ray amputation 08/21/19). As of 09/03/19, he was undergoing hemodialysis via [facility] that had been in place for 2 years.  - Admitted 09/02/19-09/04/19 for acute hypoxic respiratory failure due to volume overload, hypertensive emergency, acute on chronic CHF. Require supplemental O2, Nitro drip, and hemodialysis.   Diagnosed with non-ischemic cardiomyopathy in ~ 2015 with EF 25%. Last LVEF 50% 08/2019. Last cardiology evaluation 10/07/19 by Levell July, NP. No anginal symptoms. Continue current meds for CAD, HTN, CHF. Six month follow-up planned.  11/11/2019 presurgical COVID-19 test negative. Anesthesia team to evaluate on the day of surgery. ICD perioperative Rx still pending from [facility].   VS: Ht _0  (1.778 m)   Wt 88.5 kg   BMI 27.98 kg/m   BP Readings from Last 3 Encounters:  11/04/19 (!) 151/70  10/16/19 (!) 140/58  10/07/19 (!) 148/56   Pulse Readings from Last 3 Encounters:  11/04/19 70  10/16/19 (!) 16  10/07/19 78    PROVIDERS: [name], MD is PCP Carlyle Dolly, MD is cardiologist Virl Axe, MD is EP cardiologist   LABS: For day of surgery. On 09/04/19, H/H  8.9/28.8. A1c 8.1% on 07/10/19.    IMAGES: 1V PCXR 09/03/2019: FINDINGS: Cardiomegaly with Kerley lines and cephalized blood flow. Fissural thickening and small effusions. Dialysis catheter on the right and direct defibrillator lead on the left. IMPRESSION: CHF.   EKG: EKG 09/04/2019: Normal sinus rhythm rate of 92, moderate voltage criteria for LVH, T wave abnormality consider lateral ischemia   CV: Echocardiogram 09/03/2019: Impressions 1. Left ventricular ejection fraction, by estimation, is 50%. The left ventricle has mildly decreased function. Left ventricular endocardial border not optimally defined to evaluate regional wall motion. There is moderate left ventricular hypertrophy. Left ventricular diastolic parameters are consistent with Grade I diastolic dysfunction (impaired relaxation). 2. Right ventricular systolic function is normal. The right ventricular size is normal. 3. Left atrial size was mildly dilated. 4. The mitral valve is normal in structure. No evidence of mitral valve regurgitation. No evidence of mitral stenosis. 5. The aortic valve has an indeterminant number of cusps. Aortic valve regurgitation is not visualized. No aortic stenosis is present. 6. The inferior vena cava is normal in size with greater than 50% respiratory variability, suggesting right atrial pressure of 3 mmHg. (Comparsion TTE 09/09/16: mild-moderate LVH, EF 55%, no regional wall motion abnormalities, grade 1 DD)   Carotid US 11/29/2017: Summary:  Right Carotid: Velocities in the right ICA are consistent with a 1-39% stenosis. Non-hemodynamically significant plaque <50% noted in the CCA.  Left Carotid: Velocities in the left ICA are consistent with a 1-39% stenosis. The ECA appears >50% stenosed.  Vertebrals: Right vertebral artery demonstrates antegrade flow. Left vertebral  artery demonstrates bidirectional flow.  Subclavians: Left subclavian artery  flow was disturbed. Normal flow  hemodynamics were seen in the right subclavian artery.    Nuclear stress 04/21/2016: Impression: 1.  Moderate sized fixed defect seen involving inferior myocardium consistent with old infarction.  No definite reversibility is noted. 2.  Mild global hypomotility is noted. 3.  Left ventricular ejection fraction 42%. 4.  Noninvasive stratification: Intermediate.   RHC/LHC 01/30/2013 (Dr. Glori Bickers): Findings: RA = 6 RV = 48/5/7 PA = 47/22 (33) PCW = 20 Fick cardiac output/index = 5.2/2.5 PVR = 2.5 WU SVR = 1192 FA sat = 98% PA sat = 65%, 68%  Ao Pressure: 116/64 (84) LV Pressure: 122/13/18 There was no signficant gradient across the aortic valve on pullback.  Left main: Mild ostial tapering otherwise ok  LAD: Narrow vessel (due to diffuse diabetic vasculopathy). 30% ostial. 40-50% tubular lesion in the mid section. 80% lesion distally. Large diagonal with moderate diffuse disease and 70% lesion in midsection  LCX: Non dominant vessel with diffuse diabetic vasculopathy. Large OM-1. Small OM-2 and OM-3. 2 PLs. 30% mid AV groove CX lesion. 40% lesion in proximal OM-1  RCA: Large dominant vessel with just mild plaque  Assessment: 1. CAD with diffuse diabetic vasculopathy. Only high grade lesion is in small distal LAD 2. Well-compensated hemodynamics  Plan/Discussion: He has diffuse diabetic vasculopathy but TIMI-3 flow throughout. Suspect he has NICM. Continue medical therapy.   Past Medical History:  Diagnosis Date  . 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 Allergic rhinitis   . Anemia   . Arthritis   . Chronic systolic heart failure ([place])    a. ECHO (12/2012) EF 25-30%, HK entireanteroseptal myocardium //  b.  EF 25%, diffuse HK, grade 1 diastolic dysfunction, MAC, mild LAE, normal RVSF, trivial pericardial effusion  . COPD (chronic obstructive pulmonary disease) ([place])   . Diabetes mellitus type II   . Diabetic  nephropathy ([place])   . Diabetic neuropathy ([place])   . ESRD on hemodialysis [facility])    started HD June 2017, goes to [facility] unit, Dr Hinda Lenis  . History of cardiac catheterization    a.Myoview 1/15:  There is significant left ventricular dysfunction. There may be slight scar at the apex. There is no significant ischemia. LV Ejection Fraction: 27%  //  b. RHC/LHC (1/15) with mean RA 6, PA 47/22 mean 33, mean PCWP 20, PVR 2.5 WU, CI 2.5; 80% dLAD stenosis, 70% diffuse large D.   . History of kidney stones   . Hyperlipidemia   . Hypertension   . Kidney stones   . NICM (nonischemic cardiomyopathy) ([place])    Primarily nonischemic. Echo (12/14) with EF 25-30%. Echo (3/15) with EF 25%, mild to moderately dilated LV, normal RV size and systolic function.   . Osteomyelitis ([place])    left fifth ray  . Pneumonia   . Urethral stricture   . Wears glasses     Past Surgical History:  Procedure Laterality Date  . ABDOMINAL AORTOGRAM W/LOWER EXTREMITY N/A 03/30/2016   Procedure: Abdominal Aortogram w/Lower Extremity;  Surgeon: Angelia Mould, MD;  Location: [place] CV LAB;  Service: Cardiovascular;  Laterality: N/A;  . AMPUTATION Right 04/26/2016   Procedure: Right Below Knee Amputation;  Surgeon: Newt Minion, MD;  Location: [place];  Service: Orthopedics;  Laterality: Right;  . AMPUTATION Left 08/21/2019   Procedure: LEFT FOOT 5TH RAY AMPUTATION;  Surgeon: Newt Minion, MD;  Location: [place];  Service: Orthopedics;  Laterality: Left;  .  AV FISTULA PLACEMENT Right 09/08/2015   Procedure: INSERTION OF 4-45m x 45cm  ARTERIOVENOUS (AV) GORE-TEX GRAFT RIGHT UPPER  ARM;  Surgeon: CAngelia Mould MD;  [place]  Service: Vascular;  Laterality: Right;  . AV FISTULA PLACEMENT Left 01/14/2016   Procedure: CREATION OF LEFT UPPER ARM ARTERIOVENOUS FISTULA;  Surgeon: CAngelia Mould MD;  [place]  Service: Vascular;  Laterality: Left;  . BASCILIC VEIN TRANSPOSITION Right  08/22/2014   Procedure: RIGHT UPPER ARM BASCILIC VEIN TRANSPOSITION;  Surgeon: CAngelia Mould MD;  [place]  Service: Vascular;  Laterality: Right;  . BELOW KNEE LEG AMPUTATION Right 04/26/2016  . CARDIAC CATHETERIZATION    . CARDIAC DEFIBRILLATOR PLACEMENT  06/27/2013   Sub Q       BY DR KCaryl Comes . CATARACT EXTRACTION W/PHACO Right 08/06/2018   Procedure: CATARACT EXTRACTION PHACO AND INTRAOCULAR LENS PLACEMENT (IOC);  Surgeon: WBaruch Goldmann MD;  Location: [facility];  Service: Ophthalmology;  Laterality: Right;  CDE: 4.06  . CATARACT EXTRACTION W/PHACO Left 08/20/2018   Procedure: CATARACT EXTRACTION PHACO AND INTRAOCULAR LENS PLACEMENT (IOC);  Surgeon: WBaruch Goldmann MD;  Location: [facility];  Service: Ophthalmology;  Laterality: Left;  CDE: 6.76  . COLONOSCOPY WITH PROPOFOL N/A 07/22/2015   Procedure: COLONOSCOPY WITH PROPOFOL;  Surgeon: HDoran Stabler MD;  [facility];  Service: Gastroenterology;  Laterality: N/A;  . FEMORAL-POPLITEAL BYPASS GRAFT Right 03/31/2016   Procedure: BYPASS GRAFT FEMORAL-POPLITEAL ARTERY USING RIGHT GREATER SAPHENOUS NONREVERSED VEIN;  Surgeon: CAngelia Mould MD;  [place]  Service: Vascular;  Laterality: Right;  . HERNIA REPAIR    . I & D EXTREMITY Right 03/31/2016   Procedure: IRRIGATION AND DEBRIDEMENT FOOT;  Surgeon: CAngelia Mould MD;  [place]  Service: Vascular;  Laterality: Right;  . IMPLANTABLE CARDIOVERTER DEFIBRILLATOR IMPLANT N/A 06/27/2013   Procedure: SUB Q ICD;  Surgeon: SDeboraha Sprang MD;  [facility];  Service: Cardiovascular;  Laterality: N/A;  . INTRAOPERATIVE ARTERIOGRAM Right 03/31/2016   Procedure: INTRA OPERATIVE ARTERIOGRAM;  Surgeon: CAngelia Mould MD;  [place]  Service: Vascular;  Laterality: Right;  . IR GENERIC HISTORICAL Right 11/30/2015   IR THROMBECTOMY AV FISTULA W/THROMBOLYSIS/PTA INC/SHUNT/IMG RIGHT 11/30/2015 GAletta Edouard MD [facility]  . IR  GENERIC HISTORICAL  11/30/2015   IR [place]ASC ACCESS RIGHT 11/30/2015 GAletta Edouard MD [facility]  . IR GENERIC HISTORICAL Right 12/15/2015   IR THROMBECTOMY AV FISTULA W/THROMBOLYSIS/PTA/STENT INC/SHUNT/IMG RT 12/15/2015 DArne Cleveland MD [facility]  . IR GENERIC HISTORICAL  12/15/2015   IR [place] VASC ACCESS RIGHT 12/15/2015 DArne Cleveland MD [facility]  . IR GENERIC HISTORICAL  12/28/2015   IR FLUORO GUIDE CV LINE RIGHT 12/28/2015 AMarybelle Killings MD [facility]  . IR GENERIC HISTORICAL  12/28/2015   IR [place] VASC ACCESS RIGHT 12/28/2015 AMarybelle Killings MD [facility]  . LEFT A ND RIGHT HEART CATH  01/30/2013   DR BSung Amabile . LEFT AND RIGHT HEART CATHETERIZATION WITH CORONARY ANGIOGRAM N/A 01/30/2013   Procedure: LEFT AND RIGHT HEART CATHETERIZATION WITH CORONARY ANGIOGRAM;  Surgeon: DJolaine Artist MD;  [facility];  Service: Cardiovascular;  Laterality: N/A;  . PERIPHERAL VASCULAR CATHETERIZATION Right 01/26/2015   Procedure: A/V Fistulagram;  Surgeon: CAngelia Mould MD;  [place] [facility];  Service: Cardiovascular;  Laterality: Right;  . reapea urethral surgery for recurrent obstruction  2011  . TOTAL KNEE ARTHROPLASTY  Right 2007  . VEIN HARVEST Right 03/31/2016   Procedure: RIGHT GREATER SAPHENOUS VEIN HARVEST;  Surgeon: Angelia Mould, MD;  [facility] OR;  Service: Vascular;  Laterality: Right;    MEDICATIONS: No current facility-administered medications for this encounter.   [name] albuterol (VENTOLIN HFA) 108 (90 Base) MCG/ACT inhaler  . amLODipine (NORVASC) 10 MG tablet  . aspirin EC 81 MG tablet  . atorvastatin (LIPITOR) 80 MG tablet  . azelastine (ASTELIN) 0.1 % nasal spray  . carvedilol (COREG) 25 MG tablet  . doxycycline (VIBRA-TABS) 100 MG tablet  . fenofibrate 160 MG tablet  . fexofenadine (ALLEGRA) 180 MG tablet  . furosemide (LASIX) 40 MG tablet  . gabapentin (NEURONTIN) 300 MG capsule  . Insulin Glargine  (BASAGLAR KWIKPEN) 100 UNIT/ML  . Insulin Lispro (HUMALOG KWIKPEN) 200 UNIT/ML SOPN  . lanthanum (FOSRENOL) 500 MG chewable tablet  . multivitamin (RENA-VIT) TABS tablet  . Olopatadine HCl (PATADAY) 0.2 % SOLN  . Continuous Blood Gluc Sensor (FREESTYLE LIBRE 14 DAY SENSOR) MISC  . glucose blood test strip  . Insulin Pen Needle (BD PEN NEEDLE NANO U/F) 32G X 4 MM MISC  . nitroGLYCERIN (NITRODUR - DOSED IN MG/24 HR) 0.2 mg/hr patch    Myra Gianotti, PA-C Surgical Short Stay/Anesthesiology [facility] Phone [phone] [facility] Phone [phone] 11/12/2019 2:08 PM

## 2019-11-12 NOTE — Progress Notes (Signed)
[facility] DEVICE PROGRAMMING   Patient Information: Name: Anthony Bullock, Anthony Bullock  DOB: 1960/01/15  MRN: [id number]    Planned Procedure: left below knee amputation  Surgeon: Meridee Score, MD  Date of Procedure: 11/13/19  Cautery will be used.  Position during surgery:    Please send documentation back to:  [facility] (Fax # [phone])   Kayleen Memos, RN  11/12/2019 7:49 AM       Device Information:   Clinic EP Physician:   Virl Axe, MD Device Type:  Defibrillator Manufacturer and Phone #:  [employer]: [phone] Pacemaker Dependent?:  No Date of Last Device Check:  6/62021        Normal Device Function?:  Yes     Electrophysiologist's Recommendations:    Have magnet available.  Provide continuous ECG monitoring when magnet is used or reprogramming is to be performed.   Procedure should not interfere with device function.  No device programming or magnet placement needed.  Per Device Clinic Standing Orders, Drake Leach  11/12/2019 3:09 PM

## 2019-11-13 ENCOUNTER — Inpatient Hospital Stay (HOSPITAL_COMMUNITY): Payer: Medicare Other | Admitting: Vascular Surgery

## 2019-11-13 ENCOUNTER — Encounter (HOSPITAL_COMMUNITY)
Admission: RE | Disposition: A | Discharge status: Rehabilitation Facility | Payer: Self-pay | Source: Home / Self Care | Attending: Orthopedic Surgery

## 2019-11-13 ENCOUNTER — Inpatient Hospital Stay (HOSPITAL_COMMUNITY)
Admission: RE | Admit: 2019-11-13 | Discharge: 2019-11-16 | DRG: 474 | Disposition: A | Discharge status: Rehabilitation Facility | Payer: Medicare Other | Attending: Orthopedic Surgery | Admitting: Orthopedic Surgery

## 2019-11-13 ENCOUNTER — Other Ambulatory Visit: Payer: Self-pay

## 2019-11-13 ENCOUNTER — Encounter (HOSPITAL_COMMUNITY): Payer: Self-pay | Admitting: Orthopedic Surgery

## 2019-11-13 DIAGNOSIS — J449 Chronic obstructive pulmonary disease, unspecified: Secondary | ICD-10-CM | POA: Diagnosis present

## 2019-11-13 DIAGNOSIS — Z87891 Personal history of nicotine dependence: Secondary | ICD-10-CM | POA: Diagnosis not present

## 2019-11-13 DIAGNOSIS — Z833 Family history of diabetes mellitus: Secondary | ICD-10-CM

## 2019-11-13 DIAGNOSIS — T8781 Dehiscence of amputation stump: Secondary | ICD-10-CM | POA: Diagnosis present

## 2019-11-13 DIAGNOSIS — D631 Anemia in chronic kidney disease: Secondary | ICD-10-CM | POA: Diagnosis present

## 2019-11-13 DIAGNOSIS — Z4781 Encounter for orthopedic aftercare following surgical amputation: Secondary | ICD-10-CM | POA: Diagnosis not present

## 2019-11-13 DIAGNOSIS — Z95810 Presence of automatic (implantable) cardiac defibrillator: Secondary | ICD-10-CM | POA: Diagnosis not present

## 2019-11-13 DIAGNOSIS — Z20822 Contact with and (suspected) exposure to covid-19: Secondary | ICD-10-CM | POA: Diagnosis present

## 2019-11-13 DIAGNOSIS — E663 Overweight: Secondary | ICD-10-CM | POA: Diagnosis present

## 2019-11-13 DIAGNOSIS — N2581 Secondary hyperparathyroidism of renal origin: Secondary | ICD-10-CM | POA: Diagnosis present

## 2019-11-13 DIAGNOSIS — E1169 Type 2 diabetes mellitus with other specified complication: Secondary | ICD-10-CM | POA: Diagnosis present

## 2019-11-13 DIAGNOSIS — E785 Hyperlipidemia, unspecified: Secondary | ICD-10-CM | POA: Diagnosis present

## 2019-11-13 DIAGNOSIS — E1151 Type 2 diabetes mellitus with diabetic peripheral angiopathy without gangrene: Secondary | ICD-10-CM | POA: Diagnosis present

## 2019-11-13 DIAGNOSIS — M869 Osteomyelitis, unspecified: Secondary | ICD-10-CM | POA: Diagnosis present

## 2019-11-13 DIAGNOSIS — Y835 Amputation of limb(s) as the cause of abnormal reaction of the patient, or of later complication, without mention of misadventure at the time of the procedure: Secondary | ICD-10-CM | POA: Diagnosis present

## 2019-11-13 DIAGNOSIS — L02612 Cutaneous abscess of left foot: Principal | ICD-10-CM | POA: Diagnosis present

## 2019-11-13 DIAGNOSIS — L89322 Pressure ulcer of left buttock, stage 2: Secondary | ICD-10-CM | POA: Diagnosis present

## 2019-11-13 DIAGNOSIS — Z992 Dependence on renal dialysis: Secondary | ICD-10-CM

## 2019-11-13 DIAGNOSIS — N186 End stage renal disease: Secondary | ICD-10-CM | POA: Diagnosis present

## 2019-11-13 DIAGNOSIS — E1140 Type 2 diabetes mellitus with diabetic neuropathy, unspecified: Secondary | ICD-10-CM | POA: Diagnosis present

## 2019-11-13 DIAGNOSIS — I70248 Atherosclerosis of native arteries of left leg with ulceration of other part of lower left leg: Secondary | ICD-10-CM | POA: Diagnosis not present

## 2019-11-13 DIAGNOSIS — Z6825 Body mass index (BMI) 25.0-25.9, adult: Secondary | ICD-10-CM | POA: Diagnosis not present

## 2019-11-13 DIAGNOSIS — I2510 Atherosclerotic heart disease of native coronary artery without angina pectoris: Secondary | ICD-10-CM | POA: Diagnosis present

## 2019-11-13 DIAGNOSIS — L089 Local infection of the skin and subcutaneous tissue, unspecified: Secondary | ICD-10-CM | POA: Diagnosis not present

## 2019-11-13 DIAGNOSIS — L97929 Non-pressure chronic ulcer of unspecified part of left lower leg with unspecified severity: Secondary | ICD-10-CM | POA: Diagnosis not present

## 2019-11-13 DIAGNOSIS — J309 Allergic rhinitis, unspecified: Secondary | ICD-10-CM | POA: Diagnosis present

## 2019-11-13 DIAGNOSIS — I132 Hypertensive heart and chronic kidney disease with heart failure and with stage 5 chronic kidney disease, or end stage renal disease: Secondary | ICD-10-CM | POA: Diagnosis present

## 2019-11-13 DIAGNOSIS — Z89422 Acquired absence of other left toe(s): Secondary | ICD-10-CM | POA: Diagnosis not present

## 2019-11-13 DIAGNOSIS — Z885 Allergy status to narcotic agent status: Secondary | ICD-10-CM | POA: Diagnosis not present

## 2019-11-13 DIAGNOSIS — I5022 Chronic systolic (congestive) heart failure: Secondary | ICD-10-CM | POA: Diagnosis present

## 2019-11-13 DIAGNOSIS — Z89511 Acquired absence of right leg below knee: Secondary | ICD-10-CM | POA: Diagnosis not present

## 2019-11-13 DIAGNOSIS — E1122 Type 2 diabetes mellitus with diabetic chronic kidney disease: Secondary | ICD-10-CM | POA: Diagnosis present

## 2019-11-13 DIAGNOSIS — L89312 Pressure ulcer of right buttock, stage 2: Secondary | ICD-10-CM | POA: Diagnosis present

## 2019-11-13 DIAGNOSIS — M86172 Other acute osteomyelitis, left ankle and foot: Secondary | ICD-10-CM | POA: Diagnosis not present

## 2019-11-13 DIAGNOSIS — I70262 Atherosclerosis of native arteries of extremities with gangrene, left leg: Secondary | ICD-10-CM | POA: Diagnosis not present

## 2019-11-13 DIAGNOSIS — Z7982 Long term (current) use of aspirin: Secondary | ICD-10-CM | POA: Diagnosis not present

## 2019-11-13 DIAGNOSIS — I739 Peripheral vascular disease, unspecified: Secondary | ICD-10-CM | POA: Diagnosis not present

## 2019-11-13 DIAGNOSIS — Z89512 Acquired absence of left leg below knee: Secondary | ICD-10-CM | POA: Diagnosis not present

## 2019-11-13 DIAGNOSIS — Z888 Allergy status to other drugs, medicaments and biological substances status: Secondary | ICD-10-CM

## 2019-11-13 DIAGNOSIS — Z79899 Other long term (current) drug therapy: Secondary | ICD-10-CM

## 2019-11-13 DIAGNOSIS — Z794 Long term (current) use of insulin: Secondary | ICD-10-CM

## 2019-11-13 DIAGNOSIS — S88112A Complete traumatic amputation at level between knee and ankle, left lower leg, initial encounter: Secondary | ICD-10-CM | POA: Diagnosis not present

## 2019-11-13 DIAGNOSIS — Z96651 Presence of right artificial knee joint: Secondary | ICD-10-CM | POA: Diagnosis present

## 2019-11-13 DIAGNOSIS — L89152 Pressure ulcer of sacral region, stage 2: Secondary | ICD-10-CM | POA: Diagnosis present

## 2019-11-13 DIAGNOSIS — M86162 Other acute osteomyelitis, left tibia and fibula: Secondary | ICD-10-CM | POA: Diagnosis not present

## 2019-11-13 DIAGNOSIS — I428 Other cardiomyopathies: Secondary | ICD-10-CM | POA: Diagnosis present

## 2019-11-13 DIAGNOSIS — E440 Moderate protein-calorie malnutrition: Secondary | ICD-10-CM | POA: Diagnosis present

## 2019-11-13 DIAGNOSIS — E1165 Type 2 diabetes mellitus with hyperglycemia: Secondary | ICD-10-CM | POA: Diagnosis not present

## 2019-11-13 DIAGNOSIS — R269 Unspecified abnormalities of gait and mobility: Secondary | ICD-10-CM | POA: Diagnosis present

## 2019-11-13 HISTORY — PX: AMPUTATION: SHX166

## 2019-11-13 LAB — SURGICAL PCR SCREEN
MRSA, PCR: NEGATIVE
Staphylococcus aureus: NEGATIVE

## 2019-11-13 LAB — GLUCOSE, CAPILLARY
Glucose-Capillary: 158 mg/dL — ABNORMAL HIGH (ref 70–99)
Glucose-Capillary: 140 mg/dL — ABNORMAL HIGH (ref 70–99)
Glucose-Capillary: 146 mg/dL — ABNORMAL HIGH (ref 70–99)
Glucose-Capillary: 136 mg/dL — ABNORMAL HIGH (ref 70–99)
Glucose-Capillary: 244 mg/dL — ABNORMAL HIGH (ref 70–99)

## 2019-11-13 LAB — POCT I-STAT, CHEM 8
BUN: 51 mg/dL — ABNORMAL HIGH (ref 6–20)
Calcium, Ion: 1.09 mmol/L — ABNORMAL LOW (ref 1.15–1.40)
Chloride: 99 mmol/L (ref 98–111)
Creatinine, Ser: 9.9 mg/dL — ABNORMAL HIGH (ref 0.61–1.24)
Glucose, Bld: 130 mg/dL — ABNORMAL HIGH (ref 70–99)
HCT: 43 % (ref 39.0–52.0)
Hemoglobin: 14.6 g/dL (ref 13.0–17.0)
Potassium: 5 mmol/L (ref 3.5–5.1)
Sodium: 138 mmol/L (ref 135–145)
TCO2: 24 mmol/L (ref 22–32)

## 2019-11-13 LAB — HEMOGLOBIN A1C
Hgb A1c MFr Bld: 6.5 % — ABNORMAL HIGH (ref 4.8–5.6)
Mean Plasma Glucose: 139.85 mg/dL

## 2019-11-13 SURGERY — AMPUTATION BELOW KNEE
Anesthesia: Monitor Anesthesia Care | Site: Knee | Laterality: Left

## 2019-11-13 MED ORDER — DOXERCALCIFEROL 4 MCG/2ML IV SOLN
2.5000 ug | INTRAVENOUS | Status: DC
  Filled 2019-11-13: qty 2

## 2019-11-13 MED ORDER — LANTHANUM CARBONATE 500 MG PO CHEW
2000.0000 mg | CHEWABLE_TABLET | Freq: Three times a day (TID) | ORAL | Status: DC
  Filled 2019-11-13 (×2): qty 4

## 2019-11-13 MED ORDER — ONDANSETRON HCL 4 MG/2ML IJ SOLN: 4.0000 mg | Freq: Four times a day (QID) | INTRAMUSCULAR | Status: DC | PRN

## 2019-11-13 MED ORDER — INSULIN ASPART 100 UNIT/ML ~~LOC~~ SOLN
4.0000 [IU] | Freq: Three times a day (TID) | SUBCUTANEOUS | Status: DC
  Administered 2019-11-13 – 2019-11-14 (×2): 4 [IU] via SUBCUTANEOUS

## 2019-11-13 MED ORDER — CEFAZOLIN SODIUM-DEXTROSE 2-4 GM/100ML-% IV SOLN
INTRAVENOUS | Status: AC
  Filled 2019-11-13: qty 100

## 2019-11-13 MED ORDER — LACTATED RINGERS IV SOLN: INTRAVENOUS | Status: DC

## 2019-11-13 MED ORDER — PROMETHAZINE HCL 25 MG/ML IJ SOLN: 6.2500 mg | INTRAMUSCULAR | Status: DC | PRN

## 2019-11-13 MED ORDER — BUPIVACAINE LIPOSOME 1.3 % IJ SUSP
INTRAMUSCULAR | Status: DC | PRN
  Administered 2019-11-13: 10 mL

## 2019-11-13 MED ORDER — METOCLOPRAMIDE HCL 5 MG PO TABS: 5.0000 mg | ORAL_TABLET | Freq: Three times a day (TID) | ORAL | Status: DC | PRN

## 2019-11-13 MED ORDER — ATORVASTATIN CALCIUM 80 MG PO TABS
80.0000 mg | ORAL_TABLET | Freq: Every day | ORAL | Status: DC
  Administered 2019-11-14 – 2019-11-15 (×2): 80 mg via ORAL
  Filled 2019-11-13 (×2): qty 1

## 2019-11-13 MED ORDER — CEFAZOLIN SODIUM-DEXTROSE 1-4 GM/50ML-% IV SOLN
1.0000 g | Freq: Four times a day (QID) | INTRAVENOUS | Status: AC
  Administered 2019-11-13 – 2019-11-14 (×3): 1 g via INTRAVENOUS
  Filled 2019-11-13 (×3): qty 50

## 2019-11-13 MED ORDER — FENTANYL CITRATE (PF) 100 MCG/2ML IJ SOLN: 50.0000 ug | Freq: Once | INTRAMUSCULAR | Status: AC

## 2019-11-13 MED ORDER — LANTHANUM CARBONATE 500 MG PO CHEW
500.0000 mg | CHEWABLE_TABLET | ORAL | Status: DC
  Administered 2019-11-13 – 2019-11-16 (×5): 500 mg via ORAL
  Filled 2019-11-13 (×10): qty 1

## 2019-11-13 MED ORDER — MIDAZOLAM HCL 2 MG/2ML IJ SOLN
INTRAMUSCULAR | Status: AC
  Filled 2019-11-13: qty 2

## 2019-11-13 MED ORDER — CARVEDILOL 25 MG PO TABS
25.0000 mg | ORAL_TABLET | Freq: Two times a day (BID) | ORAL | Status: DC
  Administered 2019-11-13 – 2019-11-15 (×4): 25 mg via ORAL
  Filled 2019-11-13 (×5): qty 1

## 2019-11-13 MED ORDER — INSULIN ASPART 100 UNIT/ML ~~LOC~~ SOLN
0.0000 [IU] | Freq: Every day | SUBCUTANEOUS | Status: DC
  Administered 2019-11-13: 2 [IU] via SUBCUTANEOUS

## 2019-11-13 MED ORDER — ORAL CARE MOUTH RINSE: 15.0000 mL | Freq: Once | OROMUCOSAL | Status: AC

## 2019-11-13 MED ORDER — PROPOFOL 500 MG/50ML IV EMUL
INTRAVENOUS | Status: DC | PRN
  Administered 2019-11-13: 125 ug/kg/min via INTRAVENOUS

## 2019-11-13 MED ORDER — INSULIN GLARGINE 100 UNIT/ML ~~LOC~~ SOLN
35.0000 [IU] | Freq: Every day | SUBCUTANEOUS | Status: DC
  Administered 2019-11-13 – 2019-11-14 (×2): 35 [IU] via SUBCUTANEOUS
  Filled 2019-11-13 (×3): qty 0.35

## 2019-11-13 MED ORDER — OXYCODONE HCL 5 MG PO TABS
10.0000 mg | ORAL_TABLET | ORAL | Status: DC | PRN
  Administered 2019-11-13: 15 mg via ORAL
  Administered 2019-11-14: 10 mg via ORAL
  Administered 2019-11-14 – 2019-11-15 (×5): 15 mg via ORAL
  Administered 2019-11-15: 10 mg via ORAL
  Administered 2019-11-15 – 2019-11-16 (×2): 15 mg via ORAL
  Filled 2019-11-13 (×8): qty 3
  Filled 2019-11-13 (×2): qty 2

## 2019-11-13 MED ORDER — FUROSEMIDE 20 MG PO TABS
120.0000 mg | ORAL_TABLET | Freq: Every day | ORAL | Status: DC
  Administered 2019-11-13 – 2019-11-16 (×4): 120 mg via ORAL
  Filled 2019-11-13 (×3): qty 6

## 2019-11-13 MED ORDER — ALBUTEROL SULFATE HFA 108 (90 BASE) MCG/ACT IN AERS
2.0000 | INHALATION_SPRAY | RESPIRATORY_TRACT | Status: DC | PRN
  Filled 2019-11-13: qty 6.7

## 2019-11-13 MED ORDER — INSULIN ASPART 100 UNIT/ML ~~LOC~~ SOLN
0.0000 [IU] | Freq: Three times a day (TID) | SUBCUTANEOUS | Status: DC
  Administered 2019-11-13 – 2019-11-14 (×2): 1 [IU] via SUBCUTANEOUS

## 2019-11-13 MED ORDER — PHENYLEPHRINE HCL-NACL 10-0.9 MG/250ML-% IV SOLN
INTRAVENOUS | Status: DC | PRN
  Administered 2019-11-13: 40 ug/min via INTRAVENOUS

## 2019-11-13 MED ORDER — BUPIVACAINE HCL (PF) 0.5 % IJ SOLN
INTRAMUSCULAR | Status: DC | PRN
  Administered 2019-11-13: 40 mL

## 2019-11-13 MED ORDER — FENTANYL CITRATE (PF) 100 MCG/2ML IJ SOLN: 25.0000 ug | INTRAMUSCULAR | Status: DC | PRN

## 2019-11-13 MED ORDER — LORATADINE 10 MG PO TABS
10.0000 mg | ORAL_TABLET | Freq: Every day | ORAL | Status: DC
  Administered 2019-11-14 – 2019-11-16 (×3): 10 mg via ORAL
  Filled 2019-11-13 (×3): qty 1

## 2019-11-13 MED ORDER — AZELASTINE HCL 0.1 % NA SOLN
2.0000 | Freq: Three times a day (TID) | NASAL | Status: DC | PRN
  Filled 2019-11-13: qty 30

## 2019-11-13 MED ORDER — METOCLOPRAMIDE HCL 5 MG/ML IJ SOLN: 5.0000 mg | Freq: Three times a day (TID) | INTRAMUSCULAR | Status: DC | PRN

## 2019-11-13 MED ORDER — HYDROMORPHONE HCL 1 MG/ML IJ SOLN
0.5000 mg | INTRAMUSCULAR | Status: DC | PRN
  Administered 2019-11-14 – 2019-11-16 (×6): 0.5 mg via INTRAVENOUS
  Filled 2019-11-13 (×6): qty 0.5

## 2019-11-13 MED ORDER — CINACALCET HCL 30 MG PO TABS
30.0000 mg | ORAL_TABLET | ORAL | Status: DC
  Filled 2019-11-13: qty 1

## 2019-11-13 MED ORDER — CHLORHEXIDINE GLUCONATE CLOTH 2 % EX PADS
6.0000 | MEDICATED_PAD | Freq: Every day | CUTANEOUS | Status: DC
  Administered 2019-11-16: 6 via TOPICAL

## 2019-11-13 MED ORDER — ASPIRIN EC 81 MG PO TBEC
81.0000 mg | DELAYED_RELEASE_TABLET | Freq: Every day | ORAL | Status: DC
  Administered 2019-11-13 – 2019-11-16 (×4): 81 mg via ORAL
  Filled 2019-11-13 (×4): qty 1

## 2019-11-13 MED ORDER — CEFAZOLIN SODIUM-DEXTROSE 2-4 GM/100ML-% IV SOLN
2.0000 g | INTRAVENOUS | Status: AC
  Administered 2019-11-13: 2 g via INTRAVENOUS

## 2019-11-13 MED ORDER — FENOFIBRATE 160 MG PO TABS
160.0000 mg | ORAL_TABLET | Freq: Every day | ORAL | Status: DC
  Administered 2019-11-14 – 2019-11-16 (×3): 160 mg via ORAL
  Filled 2019-11-13 (×3): qty 1

## 2019-11-13 MED ORDER — LANTHANUM CARBONATE 500 MG PO CHEW
2000.0000 mg | CHEWABLE_TABLET | Freq: Three times a day (TID) | ORAL | Status: DC
  Administered 2019-11-13 – 2019-11-15 (×5): 2000 mg via ORAL
  Filled 2019-11-13 (×10): qty 4

## 2019-11-13 MED ORDER — CHLORHEXIDINE GLUCONATE 0.12 % MT SOLN
OROMUCOSAL | Status: AC
  Administered 2019-11-13: 15 mL via OROMUCOSAL
  Filled 2019-11-13: qty 15

## 2019-11-13 MED ORDER — SODIUM CHLORIDE 0.9 % IR SOLN
Status: DC | PRN
  Administered 2019-11-13: 1000 mL

## 2019-11-13 MED ORDER — ONDANSETRON HCL 4 MG PO TABS
4.0000 mg | ORAL_TABLET | Freq: Four times a day (QID) | ORAL | Status: DC | PRN
  Administered 2019-11-14: 4 mg via ORAL
  Filled 2019-11-13: qty 1

## 2019-11-13 MED ORDER — PHENYLEPHRINE 40 MCG/ML (10ML) SYRINGE FOR IV PUSH (FOR BLOOD PRESSURE SUPPORT)
PREFILLED_SYRINGE | INTRAVENOUS | Status: DC | PRN
  Administered 2019-11-13: 160 ug via INTRAVENOUS
  Administered 2019-11-13: 80 ug via INTRAVENOUS

## 2019-11-13 MED ORDER — GABAPENTIN 300 MG PO CAPS
600.0000 mg | ORAL_CAPSULE | Freq: Two times a day (BID) | ORAL | Status: DC
  Administered 2019-11-13 – 2019-11-16 (×6): 600 mg via ORAL
  Filled 2019-11-13 (×6): qty 2

## 2019-11-13 MED ORDER — DOCUSATE SODIUM 100 MG PO CAPS
100.0000 mg | ORAL_CAPSULE | Freq: Two times a day (BID) | ORAL | Status: DC
  Administered 2019-11-13 – 2019-11-16 (×5): 100 mg via ORAL
  Filled 2019-11-13 (×6): qty 1

## 2019-11-13 MED ORDER — CHLORHEXIDINE GLUCONATE 0.12 % MT SOLN: 15.0000 mL | Freq: Once | OROMUCOSAL | Status: AC

## 2019-11-13 MED ORDER — AMLODIPINE BESYLATE 10 MG PO TABS
10.0000 mg | ORAL_TABLET | Freq: Every day | ORAL | Status: DC
  Administered 2019-11-14 – 2019-11-16 (×3): 10 mg via ORAL
  Filled 2019-11-13 (×3): qty 1

## 2019-11-13 MED ORDER — SODIUM CHLORIDE 0.9 % IV SOLN: INTRAVENOUS | Status: DC

## 2019-11-13 MED ORDER — FENTANYL CITRATE (PF) 100 MCG/2ML IJ SOLN
INTRAMUSCULAR | Status: AC
  Administered 2019-11-13: 50 ug via INTRAVENOUS
  Filled 2019-11-13: qty 2

## 2019-11-13 MED ORDER — ACETAMINOPHEN 325 MG PO TABS
325.0000 mg | ORAL_TABLET | Freq: Four times a day (QID) | ORAL | Status: DC | PRN
  Administered 2019-11-14: 650 mg via ORAL
  Filled 2019-11-13 (×2): qty 2

## 2019-11-13 MED ORDER — SODIUM CHLORIDE 0.9 % IV SOLN: INTRAVENOUS | Status: DC | PRN

## 2019-11-13 MED ORDER — INSULIN ASPART 100 UNIT/ML ~~LOC~~ SOLN: 0.0000 [IU] | Freq: Three times a day (TID) | SUBCUTANEOUS | Status: DC

## 2019-11-13 SURGICAL SUPPLY — 31 items
BLADE SAW RECIP 87.9 MT (BLADE) ×3 IMPLANT
BLADE SURG 21 STRL SS (BLADE) ×3 IMPLANT
CANISTER WOUND CARE 500ML ATS (WOUND CARE) ×3 IMPLANT
COVER SURGICAL LIGHT HANDLE (MISCELLANEOUS) ×3 IMPLANT
CUFF TOURN SGL QUICK 34 (TOURNIQUET CUFF) ×3
CUFF TRNQT CYL 34X4.125X (TOURNIQUET CUFF) ×1 IMPLANT
DRAPE DERMATAC (DRAPES) ×2 IMPLANT
DRAPE U-SHAPE 47X51 STRL (DRAPES) ×3 IMPLANT
DURAPREP 26ML APPLICATOR (WOUND CARE) ×3 IMPLANT
ELECT REM PT RETURN 9FT ADLT (ELECTROSURGICAL) ×3
ELECTRODE REM PT RTRN 9FT ADLT (ELECTROSURGICAL) ×1 IMPLANT
GLOVE BIOGEL PI IND STRL 9 (GLOVE) ×1 IMPLANT
GLOVE BIOGEL PI INDICATOR 9 (GLOVE) ×2
GLOVE SURG ORTHO 9.0 STRL STRW (GLOVE) ×3 IMPLANT
GOWN STRL REUS W/ TWL XL LVL3 (GOWN DISPOSABLE) ×2 IMPLANT
GOWN STRL REUS W/TWL XL LVL3 (GOWN DISPOSABLE) ×6
KIT BASIN OR (CUSTOM PROCEDURE TRAY) ×3 IMPLANT
KIT TURNOVER KIT B (KITS) ×3 IMPLANT
MANIFOLD NEPTUNE II (INSTRUMENTS) ×3 IMPLANT
NS IRRIG 1000ML POUR BTL (IV SOLUTION) ×3 IMPLANT
PACK ORTHO EXTREMITY (CUSTOM PROCEDURE TRAY) ×3 IMPLANT
PREVENA RESTOR ARTHOFORM 46X30 (CANNISTER) ×3 IMPLANT
STOCKINETTE IMPERVIOUS LG (DRAPES) ×3 IMPLANT
SUT ETHILON 2 0 PSLX (SUTURE) ×4 IMPLANT
SUT SILK 2 0 (SUTURE) ×3
SUT SILK 2-0 18XBRD TIE 12 (SUTURE) ×1 IMPLANT
SUT VIC AB 1 CTX 27 (SUTURE) ×6 IMPLANT
TOWEL GREEN STERILE (TOWEL DISPOSABLE) ×3 IMPLANT
TUBE CONNECTING 12'X1/4 (SUCTIONS) ×1
TUBE CONNECTING 12X1/4 (SUCTIONS) ×2 IMPLANT
YANKAUER SUCT BULB TIP NO VENT (SUCTIONS) ×3 IMPLANT

## 2019-11-13 NOTE — Consult Note (Addendum)
Renal Service Consult Note [facility]  Anthony Bullock 11/13/2019 Sol Blazing, MD Requesting Physician: Dr Sharol Given  Reason for Consult:  ESRD pt being admitted after amputation  HPI: The patient is a 59 y.o. year-old w/ hx of AICD, chron syst HF, HTN, IDDM, COPD , ESRD on HD admitted today for elective L BKA due to abscess dehiscence of left foot incision.  Asked to see for ESRD.    Pt gets HD at [facility] on MWF schedule. Goes to all his HD sessions. No hx HD issues, only BP drops sometimes. No active CP, sob, abd pain or n/v/d.    Past Medical History  Past Medical History:  Diagnosis Date  . 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 Allergic rhinitis   . Anemia   . Arthritis   . Chronic systolic heart failure ([place])    a. ECHO (12/2012) EF 25-30%, HK entireanteroseptal myocardium //  b.  EF 25%, diffuse HK, grade 1 diastolic dysfunction, MAC, mild LAE, normal RVSF, trivial pericardial effusion  . COPD (chronic obstructive pulmonary disease) ([place])   . Diabetes mellitus type II   . Diabetic nephropathy ([place])   . Diabetic neuropathy ([place])   . ESRD on hemodialysis [facility])    started HD June 2017, goes to [facility] HD unit, Dr Hinda Lenis  . History of cardiac catheterization    a.Myoview 1/15:  There is significant left ventricular dysfunction. There may be slight scar at the apex. There is no significant ischemia. LV Ejection Fraction: 27%  //  b. RHC/LHC (1/15) with mean RA 6, PA 47/22 mean 33, mean PCWP 20, PVR 2.5 WU, CI 2.5; 80% dLAD stenosis, 70% diffuse large D.   . History of kidney stones   . Hyperlipidemia   . Hypertension   . Kidney stones   . NICM (nonischemic cardiomyopathy) ([place])    Primarily nonischemic. Echo (12/14) with EF 25-30%. Echo (3/15) with EF 25%, mild to moderately dilated LV, normal RV size and systolic function.   . Osteomyelitis ([place])    left fifth ray  . Pneumonia   . Urethral stricture   .  Wears glasses    Past Surgical History  Past Surgical History:  Procedure Laterality Date  . ABDOMINAL AORTOGRAM W/LOWER EXTREMITY N/A 03/30/2016   Procedure: Abdominal Aortogram w/Lower Extremity;  Surgeon: Angelia Mould, MD;  Location: [place] CV LAB;  Service: Cardiovascular;  Laterality: N/A;  . AMPUTATION Right 04/26/2016   Procedure: Right Below Knee Amputation;  Surgeon: Newt Minion, MD;  Location: [place];  Service: Orthopedics;  Laterality: Right;  . AMPUTATION Left 08/21/2019   Procedure: LEFT FOOT 5TH RAY AMPUTATION;  Surgeon: Newt Minion, MD;  Location: [place];  Service: Orthopedics;  Laterality: Left;  . AV FISTULA PLACEMENT Right 09/08/2015   Procedure: INSERTION OF 4-4m x 45cm  ARTERIOVENOUS (AV) GORE-TEX GRAFT RIGHT UPPER  ARM;  Surgeon: CAngelia Mould MD;  [place]  Service: Vascular;  Laterality: Right;  . AV FISTULA PLACEMENT Left 01/14/2016   Procedure: CREATION OF LEFT UPPER ARM ARTERIOVENOUS FISTULA;  Surgeon: CAngelia Mould MD;  [place]  Service: Vascular;  Laterality: Left;  . BASCILIC VEIN TRANSPOSITION Right 08/22/2014   Procedure: RIGHT UPPER ARM BASCILIC VEIN TRANSPOSITION;  Surgeon: CAngelia Mould MD;  [place]  Service: Vascular;  Laterality: Right;  . BELOW KNEE LEG AMPUTATION Right 04/26/2016  . CARDIAC CATHETERIZATION    . CARDIAC  DEFIBRILLATOR PLACEMENT  06/27/2013   Sub Q       BY DR Caryl Comes  . CATARACT EXTRACTION W/PHACO Right 08/06/2018   Procedure: CATARACT EXTRACTION PHACO AND INTRAOCULAR LENS PLACEMENT (IOC);  Surgeon: Baruch Goldmann, MD;  Location: [facility];  Service: Ophthalmology;  Laterality: Right;  CDE: 4.06  . CATARACT EXTRACTION W/PHACO Left 08/20/2018   Procedure: CATARACT EXTRACTION PHACO AND INTRAOCULAR LENS PLACEMENT (IOC);  Surgeon: Baruch Goldmann, MD;  Location: [facility];  Service: Ophthalmology;  Laterality: Left;  CDE: 6.76  . COLONOSCOPY WITH PROPOFOL N/A 07/22/2015   Procedure:  COLONOSCOPY WITH PROPOFOL;  Surgeon: Doran Stabler, MD;  Location: [facility];  Service: Gastroenterology;  Laterality: N/A;  . FEMORAL-POPLITEAL BYPASS GRAFT Right 03/31/2016   Procedure: BYPASS GRAFT FEMORAL-POPLITEAL ARTERY USING RIGHT GREATER SAPHENOUS NONREVERSED VEIN;  Surgeon: Angelia Mould, MD;  Location: [place];  Service: Vascular;  Laterality: Right;  . HERNIA REPAIR    . I & D EXTREMITY Right 03/31/2016   Procedure: IRRIGATION AND DEBRIDEMENT FOOT;  Surgeon: Angelia Mould, MD;  Location: [place];  Service: Vascular;  Laterality: Right;  . IMPLANTABLE CARDIOVERTER DEFIBRILLATOR IMPLANT N/A 06/27/2013   Procedure: SUB Q ICD;  Surgeon: Deboraha Sprang, MD;  Location: [facility] CATH LAB;  Service: Cardiovascular;  Laterality: N/A;  . INTRAOPERATIVE ARTERIOGRAM Right 03/31/2016   Procedure: INTRA OPERATIVE ARTERIOGRAM;  Surgeon: Angelia Mould, MD;  Location: [place];  Service: Vascular;  Laterality: Right;  . IR GENERIC HISTORICAL Right 11/30/2015   IR THROMBECTOMY AV FISTULA W/THROMBOLYSIS/PTA INC/SHUNT/IMG RIGHT 11/30/2015 Aletta Edouard, MD [facility]  . IR GENERIC HISTORICAL  11/30/2015   IR US GUIDE VASC ACCESS RIGHT 11/30/2015 Aletta Edouard, MD [facility]  . IR GENERIC HISTORICAL Right 12/15/2015   IR THROMBECTOMY AV FISTULA W/THROMBOLYSIS/PTA/STENT INC/SHUNT/IMG RT 12/15/2015 Arne Cleveland, MD [facility]  . IR GENERIC HISTORICAL  12/15/2015   IR US GUIDE VASC ACCESS RIGHT 12/15/2015 Arne Cleveland, MD [facility]  . IR GENERIC HISTORICAL  12/28/2015   IR FLUORO GUIDE CV LINE RIGHT 12/28/2015 Marybelle Killings, MD [facility]  . IR GENERIC HISTORICAL  12/28/2015   IR US GUIDE VASC ACCESS RIGHT 12/28/2015 Marybelle Killings, MD [facility]  . LEFT A ND RIGHT HEART CATH  01/30/2013   DR Sung Amabile  . LEFT AND RIGHT HEART CATHETERIZATION WITH CORONARY ANGIOGRAM N/A 01/30/2013   Procedure: LEFT AND RIGHT HEART CATHETERIZATION WITH CORONARY ANGIOGRAM;  Surgeon: Jolaine Artist, MD;  Location: [facility] CATH LAB;  Service: Cardiovascular;  Laterality: N/A;  . PERIPHERAL VASCULAR CATHETERIZATION Right 01/26/2015   Procedure: A/V Fistulagram;  Surgeon: Angelia Mould, MD;  Location: [place] CV LAB;  Service: Cardiovascular;  Laterality: Right;  . reapea urethral surgery for recurrent obstruction  2011  . TOTAL KNEE ARTHROPLASTY Right 2007  . VEIN HARVEST Right 03/31/2016   Procedure: RIGHT GREATER SAPHENOUS VEIN HARVEST;  Surgeon: Angelia Mould, MD;  Location: [facility] OR;  Service: Vascular;  Laterality: Right;   Family History  Family History  Problem Relation Age of Onset  . Bladder Cancer Mother   . Alcohol abuse Father   . Melanoma Father   . Stroke Maternal Grandmother   . Heart Problems Maternal Grandmother        unknown  . Diabetes Maternal Grandmother   . Heart disease Maternal Grandfather   . Prostate cancer Maternal Grandfather    Social History  reports that he quit smoking about 9 years ago. His smoking use included  cigarettes. He has a 64.00 pack-year smoking history. He has never used smokeless tobacco. He reports that he does not drink alcohol and does not use drugs. Allergies  Allergies  Allergen Reactions  . Epoetin Alfa Other (See Comments)    unknown  . Ferumoxytol Other (See Comments)    unknown  . Morphine Sulfate Rash and Other (See Comments)    Itches all over, red spots   Home medications Prior to Admission medications   Medication Sig Start Date End Date Taking? Authorizing Provider  amLODipine (NORVASC) 10 MG tablet Take 1 tablet (10 mg total) by mouth daily. 10/07/19  Yes Verta Ellen., NP  aspirin EC 81 MG tablet Take 81 mg by mouth daily.    Yes [provider]  atorvastatin (LIPITOR) 80 MG tablet Take 1 tablet (80 mg total) by mouth at bedtime. 05/08/19  Yes Burchette, Alinda Sierras, MD  carvedilol (COREG) 25 MG tablet Take 1 tablet (25 mg total) by mouth 2 (two) times daily. 10/07/19 01/05/20 Yes  Verta Ellen., NP  doxycycline (VIBRA-TABS) 100 MG tablet Take 1 tablet (100 mg total) by mouth 2 (two) times daily. 10/30/19  Yes Dondra Prader R, NP  fenofibrate 160 MG tablet Take 1 tablet (160 mg total) by mouth daily. 05/08/19  Yes Burchette, Alinda Sierras, MD  fexofenadine (ALLEGRA) 180 MG tablet Take 180 mg by mouth daily.   Yes [provider]  furosemide (LASIX) 40 MG tablet Take 3 tablets by mouth once daily Patient taking differently: Take 120 mg by mouth daily.  08/07/18  Yes Burchette, Alinda Sierras, MD  gabapentin (NEURONTIN) 300 MG capsule Take 2 capsules (600 mg total) by mouth 2 (two) times daily. 05/08/19  Yes Burchette, Alinda Sierras, MD  Insulin Glargine (BASAGLAR KWIKPEN) 100 UNIT/ML INJECT 70 UNITS SUBCUTANEOUSLY AT BEDTIME Patient taking differently: Inject 2-80 Units into the skin at bedtime as needed (low blood glucose). Sliding scale 05/08/19  Yes Burchette, Alinda Sierras, MD  Insulin Lispro (HUMALOG KWIKPEN) 200 UNIT/ML SOPN Inject 10 Units into the skin 3 (three) times daily with meals. Use tid with meals as per sliding scale Patient taking differently: Inject 2-14 Units into the skin 3 (three) times daily with meals. sliding scale 03/16/18  Yes Burchette, Alinda Sierras, MD  lanthanum (FOSRENOL) 500 MG chewable tablet Chew 500-1,000 mg by mouth See admin instructions. Take 2000 mg with meals three time a day and 500 mg with snacks   Yes [provider]  multivitamin (RENA-VIT) TABS tablet Take 1 tablet by mouth daily. 03/21/19  Yes Burchette, Alinda Sierras, MD  albuterol (VENTOLIN HFA) 108 (90 Base) MCG/ACT inhaler Inhale 2 puffs into the lungs every 4 (four) hours as needed for wheezing or shortness of breath. 10/23/19   Burchette, Alinda Sierras, MD  azelastine (ASTELIN) 0.1 % nasal spray Place 2 sprays into both nostrils 3 (three) times daily as needed for rhinitis. Use in each nostril as directed 03/10/14   Burchette, Alinda Sierras, MD  Continuous Blood Gluc Sensor (FREESTYLE LIBRE 14 DAY SENSOR) MISC  USE AS DIRECTED EVERY 14 DAYS 10/16/19   Burchette, Alinda Sierras, MD  glucose blood test strip Check 1 time daily. E11.9 One Touch Ultra Blue Test Strips 03/10/14   Burchette, Alinda Sierras, MD  Insulin Pen Needle (BD PEN NEEDLE NANO U/F) 32G X 4 MM MISC USE 1 PEN NEEDLE SUBCUTANEOUSLY WITH INSULIN 4 TIMES DAILY 12/21/18   Burchette, Alinda Sierras, MD  nitroGLYCERIN (NITRODUR - DOSED IN MG/24 HR) 0.2  mg/hr patch Place 1 patch (0.2 mg total) onto the skin daily. Patient not taking: Reported on 11/08/2019 09/06/19   Newt Minion, MD  Olopatadine HCl (PATADAY) 0.2 % SOLN Place 1 drop into both eyes daily as needed (for allergies).     [provider]     Vitals:   11/13/19 1400 11/13/19 1415 11/13/19 1430 11/13/19 1445  BP: (!) 135/57 131/60 (!) 131/57 124/61  Pulse:      Resp:      Temp:      TempSrc:      SpO2:      Weight:      Height:       Exam Gen alert, no distress, post op in pacu No rash, cyanosis or gangrene Sclera anicteric, throat clear  No jvd or bruits Chest clear bilat to bases no rales or wheezing RRR no MRG, L axillary region +ICD sq Abd soft ntnd no mass or ascites +bs GU normal male MS bilat BKA, large wound vac on L bka, no sig leg edema Neuro is alert, Ox 3 , nf  RIJ TDC intact    Home meds:  - norvasc qd / asa / lipitor qd/ coreg 25 bid/ fenofibrate qd/ lasix 120 qd/ ntg patch qd  - fosrenol 2gm ac tid/ sensipar 30 tiw po/ rena-vit qd  - lantus insulin 70 u hs/ lispro 10u tid ac  - neurontin 600 bid/ allegra 180 qd  - prn's/ vitamins/ supplements   OP HD: [facility] MWF   3h 72mn  400/600  2.2/5 bath  TDC RIJ  83kg  Hep 1000+ 500/hr  - hect 2.5 ug tiw  - epo 4000u IV tiw  - sensipar 30 po tiw   Assessment/ Plan: 1. PAD s/p L BKA - on 11/3 per Dr DSharol Given2. [place] No urgent need for HD today, will plan HD in am tomorrow then again Friday to get back on schedule.  3. IDDM 4. Anemia ckd - Hb good, 14. No esa needed here 5. MBD ckd -  cont sensipar, hectorol, binder 6. BP/ volume - bp's normal post-op, no vol excess on exam. Moderate UF w/ hd tomorrow.  7. H/o chron systolic CHF/ sp AICD      RKelly Splinter MD 11/13/2019, 2:47 PM  Recent Labs  Lab 11/13/19 1033  HGB 14.6   Recent Labs  Lab 11/13/19 1033  K 5.0  BUN 51*  CREATININE 9.90*

## 2019-11-13 NOTE — Transfer of Care (Signed)
Immediate Anesthesia Transfer of Care Note  Patient: Anthony Bullock  Procedure(s) Performed: LEFT BELOW KNEE AMPUTATION (Left Knee)  Patient Location: PACU  Anesthesia Type:MAC and Regional  Level of Consciousness: awake and alert   Airway & Oxygen Therapy: Patient Spontanous Breathing  Post-op Assessment: Report given to RN and Post -op Vital signs reviewed and stable  Post vital signs: Reviewed and stable  Last Vitals:  Vitals Value Taken Time  BP 123/57 11/13/19 1322  Temp 36.6 C 11/13/19 1320  Pulse 65 11/13/19 1325  Resp 18 11/13/19 1325  SpO2 100 % 11/13/19 1325  Vitals shown include unvalidated device data.  Last Pain:  Vitals:   11/13/19 1320  TempSrc:   PainSc: 0-No pain         Complications: No complications documented.

## 2019-11-13 NOTE — Op Note (Signed)
   Date of Surgery: 11/13/2019  INDICATIONS: Mr. Odriscoll is a 59 y.o.-year-old male who presents with infected foot status post foot salvage intervention with fifth rau amputation.  PREOPERATIVE DIAGNOSIS: abscess dehiscence left foot incision  POSTOPERATIVE DIAGNOSIS: Same.  PROCEDURE: Transtibial amputation Application of Prevena wound VAC  SURGEON: Sharol Given, M.D.  ANESTHESIA:  general  IV FLUIDS AND URINE: See anesthesia.  ESTIMATED BLOOD LOSS: 200 mL.  COMPLICATIONS: None.  DESCRIPTION OF PROCEDURE: The patient was brought to the operating room and underwent a general anesthetic. After adequate levels of anesthesia were obtained patient's lower extremity was prepped using DuraPrep draped into a sterile field. A timeout was called. The foot was draped out of the sterile field with impervious stockinette. A transverse incision was made 11 cm distal to the tibial tubercle. This curved proximally and a large posterior flap was created. The tibia was transected 1 cm proximal to the skin incision. The fibula was transected just proximal to the tibial incision. The tibia was beveled anteriorly. A large posterior flap was created. The sciatic nerve was pulled cut and allowed to retract. The vascular bundles were suture ligated with 2-0 silk. The deep and superficial fascial layers were closed using #1 Vicryl. The skin was closed using staples and 2-0 nylon. The wound was covered with a Prevena wound VAC. There was a good suction fit. A prosthetic shrinker was applied. Patient was extubated taken to the PACU in stable condition.   DISCHARGE PLANNING:  Antibiotic duration:24 hours  Weightbearing: NWB left  Pain medication: opoid pathway  Dressing care/ Wound GBE:[id number] for 1 week  Discharge to: SNF  Follow-up: In the office 1 week post operative.  Meridee Score, MD [facility] 1:24 PM

## 2019-11-13 NOTE — Anesthesia Procedure Notes (Signed)
Anesthesia Regional Block: Adductor canal block   Pre-Anesthetic Checklist: ,, timeout performed, Correct Patient, Correct Site, Correct Laterality, Correct Procedure, Correct Position, site marked, Risks and benefits discussed,  Surgical consent,  Pre-op evaluation,  At surgeon's request and post-op pain management  Laterality: Left  Prep: chloraprep       Needles:  Injection technique: Single-shot  Needle Type: Echogenic Stimulator Needle     Needle Length: 10cm      Additional Needles:   Procedures:,,,, ultrasound used (permanent image in chart),,,,  Narrative:  Start time: 11/13/2019 10:55 AM End time: 11/13/2019 11:00 AM Injection made incrementally with aspirations every 5 mL.  Performed by: Personally  Anesthesiologist: Merlinda Frederick, MD  Additional Notes: A functioning IV was confirmed and monitors were applied.  Sterile prep and drape, hand hygiene and sterile gloves were used.  Negative aspiration and test dose prior to incremental administration of local anesthetic. The patient tolerated the procedure well.Ultrasound  guidance: relevant anatomy identified, needle position confirmed, local anesthetic spread visualized around nerve(s), vascular puncture avoided.  Image printed for medical record.

## 2019-11-13 NOTE — H&P (Signed)
Anthony Bullock is an 59 y.o. male.   Chief Complaint: Left Foot osteomyelitis HPI: The patient is a 59 year old gentleman seen status post left foot fifth ray amputation on August 21, 2019. Unfortunately had some dehiscence of his incision. has been doing daily Dial soap cleansing silvadene dressing changes. Has been full weight bearing in post op shoe with walker.  Past Medical History:  Diagnosis Date  . 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 Allergic rhinitis   . Anemia   . Arthritis   . Chronic systolic heart failure ([place])    a. ECHO (12/2012) EF 25-30%, HK entireanteroseptal myocardium //  b.  EF 25%, diffuse HK, grade 1 diastolic dysfunction, MAC, mild LAE, normal RVSF, trivial pericardial effusion  . COPD (chronic obstructive pulmonary disease) ([place])   . Diabetes mellitus type II   . Diabetic nephropathy ([place])   . Diabetic neuropathy ([place])   . ESRD on hemodialysis [facility])    started HD June 2017, goes to [facility] HD unit, Dr Hinda Lenis  . History of cardiac catheterization    a.Myoview 1/15:  There is significant left ventricular dysfunction. There may be slight scar at the apex. There is no significant ischemia. LV Ejection Fraction: 27%  //  b. RHC/LHC (1/15) with mean RA 6, PA 47/22 mean 33, mean PCWP 20, PVR 2.5 WU, CI 2.5; 80% dLAD stenosis, 70% diffuse large D.   . History of kidney stones   . Hyperlipidemia   . Hypertension   . Kidney stones   . NICM (nonischemic cardiomyopathy) ([place])    Primarily nonischemic. Echo (12/14) with EF 25-30%. Echo (3/15) with EF 25%, mild to moderately dilated LV, normal RV size and systolic function.   . Osteomyelitis ([place])    left fifth ray  . Pneumonia   . Urethral stricture   . Wears glasses     Past Surgical History:  Procedure Laterality Date  . ABDOMINAL AORTOGRAM W/LOWER EXTREMITY N/A 03/30/2016   Procedure: Abdominal Aortogram w/Lower Extremity;  Surgeon: Angelia Mould, MD;   Location: [place] [facility];  Service: Cardiovascular;  Laterality: N/A;  . AMPUTATION Right 04/26/2016   Procedure: Right Below Knee Amputation;  Surgeon: Newt Minion, MD;  Location: [place];  Service: Orthopedics;  Laterality: Right;  . AMPUTATION Left 08/21/2019   Procedure: LEFT FOOT 5TH RAY AMPUTATION;  Surgeon: Newt Minion, MD;  Location: [place];  Service: Orthopedics;  Laterality: Left;  . AV FISTULA PLACEMENT Right 09/08/2015   Procedure: INSERTION OF 4-4m x 45cm  ARTERIOVENOUS (AV) GORE-TEX GRAFT RIGHT UPPER  ARM;  Surgeon: CAngelia Mould MD;  [place]  Service: Vascular;  Laterality: Right;  . AV FISTULA PLACEMENT Left 01/14/2016   Procedure: CREATION OF LEFT UPPER ARM ARTERIOVENOUS FISTULA;  Surgeon: CAngelia Mould MD;  [place]  Service: Vascular;  Laterality: Left;  . BASCILIC VEIN TRANSPOSITION Right 08/22/2014   Procedure: RIGHT UPPER ARM BASCILIC VEIN TRANSPOSITION;  Surgeon: CAngelia Mould MD;  [place]  Service: Vascular;  Laterality: Right;  . BELOW KNEE LEG AMPUTATION Right 04/26/2016  . CARDIAC CATHETERIZATION    . CARDIAC DEFIBRILLATOR PLACEMENT  06/27/2013   Sub Q       BY DR KCaryl Comes . CATARACT EXTRACTION W/PHACO Right 08/06/2018   Procedure: CATARACT EXTRACTION PHACO AND INTRAOCULAR LENS PLACEMENT (IOC);  Surgeon: WBaruch Goldmann MD;  Location: [facility];  Service: Ophthalmology;  Laterality: Right;  CDE: 4.06  .  CATARACT EXTRACTION W/PHACO Left 08/20/2018   Procedure: CATARACT EXTRACTION PHACO AND INTRAOCULAR LENS PLACEMENT (IOC);  Surgeon: Baruch Goldmann, MD;  Location: [facility];  Service: Ophthalmology;  Laterality: Left;  CDE: 6.76  . COLONOSCOPY WITH PROPOFOL N/A 07/22/2015   Procedure: COLONOSCOPY WITH PROPOFOL;  Surgeon: Doran Stabler, MD;  [facility]: [facility];  Service: Gastroenterology;  Laterality: N/A;  . FEMORAL-POPLITEAL BYPASS GRAFT Right 03/31/2016   Procedure: BYPASS GRAFT FEMORAL-POPLITEAL ARTERY USING RIGHT  GREATER SAPHENOUS NONREVERSED VEIN;  Surgeon: Angelia Mould, MD;  Location: [place];  Service: Vascular;  Laterality: Right;  . HERNIA REPAIR    . I & D EXTREMITY Right 03/31/2016   Procedure: IRRIGATION AND DEBRIDEMENT FOOT;  Surgeon: Angelia Mould, MD;  Location: [place];  Service: Vascular;  Laterality: Right;  . IMPLANTABLE CARDIOVERTER DEFIBRILLATOR IMPLANT N/A 06/27/2013   Procedure: SUB Q ICD;  Surgeon: Deboraha Sprang, MD;  Location: [facility] CATH LAB;  Service: Cardiovascular;  Laterality: N/A;  . INTRAOPERATIVE ARTERIOGRAM Right 03/31/2016   Procedure: INTRA OPERATIVE ARTERIOGRAM;  Surgeon: Angelia Mould, MD;  Location: [place];  Service: Vascular;  Laterality: Right;  . IR GENERIC HISTORICAL Right 11/30/2015   IR THROMBECTOMY AV FISTULA W/THROMBOLYSIS/PTA INC/SHUNT/IMG RIGHT 11/30/2015 Aletta Edouard, MD [facility]  . IR GENERIC HISTORICAL  11/30/2015   IR US GUIDE VASC ACCESS RIGHT 11/30/2015 Aletta Edouard, MD [facility]  . IR GENERIC HISTORICAL Right 12/15/2015   IR THROMBECTOMY AV FISTULA W/THROMBOLYSIS/PTA/STENT INC/SHUNT/IMG RT 12/15/2015 Arne Cleveland, MD [facility]  . IR GENERIC HISTORICAL  12/15/2015   IR US GUIDE VASC ACCESS RIGHT 12/15/2015 Arne Cleveland, MD [facility]  . IR GENERIC HISTORICAL  12/28/2015   IR FLUORO GUIDE CV LINE RIGHT 12/28/2015 Marybelle Killings, MD [facility]  . IR GENERIC HISTORICAL  12/28/2015   IR US GUIDE VASC ACCESS RIGHT 12/28/2015 Marybelle Killings, MD [facility]  . LEFT A ND RIGHT HEART CATH  01/30/2013   DR Sung Amabile  . LEFT AND RIGHT HEART CATHETERIZATION WITH CORONARY ANGIOGRAM N/A 01/30/2013   Procedure: LEFT AND RIGHT HEART CATHETERIZATION WITH CORONARY ANGIOGRAM;  Surgeon: Jolaine Artist, MD;  Location: [facility] CATH LAB;  Service: Cardiovascular;  Laterality: N/A;  . PERIPHERAL VASCULAR CATHETERIZATION Right 01/26/2015   Procedure: A/V Fistulagram;  Surgeon: Angelia Mould, MD;  Location: [place] [facility];  Service:  Cardiovascular;  Laterality: Right;  . reapea urethral surgery for recurrent obstruction  2011  . TOTAL KNEE ARTHROPLASTY Right 2007  . VEIN HARVEST Right 03/31/2016   Procedure: RIGHT GREATER SAPHENOUS VEIN HARVEST;  Surgeon: Angelia Mould, MD;  Location: [facility] OR;  Service: Vascular;  Laterality: Right;    Family History  Problem Relation Age of Onset  . Bladder Cancer Mother   . Alcohol abuse Father   . Melanoma Father   . Stroke Maternal Grandmother   . Heart Problems Maternal Grandmother        unknown  . Diabetes Maternal Grandmother   . Heart disease Maternal Grandfather   . Prostate cancer Maternal Grandfather    Social History:  reports that he quit smoking about 9 years ago. His smoking use included cigarettes. He has a 64.00 pack-year smoking history. He has never used smokeless tobacco. He reports that he does not drink alcohol and does not use drugs.  Allergies:  Allergies  Allergen Reactions  . Epoetin Alfa Other (See Comments)    unknown  . Ferumoxytol Other (See Comments)    unknown  .  Morphine Sulfate Rash and Other (See Comments)    Itches all over, red spots    No medications prior to admission.    Results for orders placed or performed during the hospital encounter of 11/11/19 (from the past 48 hour(s))  SARS CORONAVIRUS 2 (TAT 6-24 HRS) Nasopharyngeal Nasopharyngeal Swab     Status: None   Collection Time: 11/11/19  3:15 PM   Specimen: Nasopharyngeal Swab  Result Value Ref Range   SARS Coronavirus 2 NEGATIVE NEGATIVE    Comment: (NOTE) SARS-CoV-2 target nucleic acids are NOT DETECTED.  The SARS-CoV-2 RNA is generally detectable in upper and lower respiratory specimens during the acute phase of infection. Negative results do not preclude SARS-CoV-2 infection, do not rule out co-infections with other pathogens, and should not be used as the sole basis for treatment or other patient management decisions. Negative results must be combined with  clinical observations, patient history, and epidemiological information. The expected result is Negative.  Fact Sheet for Patients: SugarRoll.be  Fact Sheet for Healthcare Providers: https://www.woods-mathews.com/  This test is not yet approved or cleared by the [place] FDA and  has been authorized for detection and/or diagnosis of SARS-CoV-2 by FDA under an Emergency Use Authorization (EUA). This EUA will remain  in effect (meaning this test can be used) for the duration of the COVID-19 declaration under Se ction 564(b)(1) of the Act, 21 U.S.C. section 360bbb-3(b)(1), unless the authorization is terminated or revoked sooner.  Performed at [facility], [place] [address], [place], [place] [address]    No results found.  Review of Systems  All other systems reviewed and are negative.   Height _0  (1.778 m), weight 88.5 kg. Physical Exam  Incision near full length dehiscence. Unfortunately despite improved healing proximally does have exposed bone distally, 4th Mt head exposed. No surrounding erythema, drainage or odor. Heart RRR Lungs Clear Assessment/Plan Patient had a long Discussion with dr Sharol Given. Has elected to go forward with a left Below Knee Amputation  Bevely Palmer Beatrix Breece, PA 11/13/2019, 6:47 AM

## 2019-11-13 NOTE — Anesthesia Postprocedure Evaluation (Signed)
Anesthesia Post Note  Patient: Anthony Bullock  Procedure(s) Performed: LEFT BELOW KNEE AMPUTATION (Left Knee)     Patient location during evaluation: PACU Anesthesia Type: Regional and MAC Level of consciousness: awake and alert Pain management: pain level controlled Vital Signs Assessment: post-procedure vital signs reviewed and stable Respiratory status: spontaneous breathing and respiratory function stable Cardiovascular status: stable Postop Assessment: no apparent nausea or vomiting Anesthetic complications: no   No complications documented.  Last Vitals:  Vitals:   11/13/19 1415 11/13/19 1430  BP: 131/60 (!) 131/57  Pulse:    Resp:    Temp:    SpO2:      Last Pain:  Vitals:   11/13/19 1430  TempSrc:   PainSc: 0-No pain                 Merlinda Frederick

## 2019-11-13 NOTE — Interval H&P Note (Signed)
History and Physical Interval Note:  11/13/2019 12:21 PM  Anthony Bullock  has presented today for surgery, with the diagnosis of Osteomyelitis Left Foot.  The various methods of treatment have been discussed with the patient and family. After consideration of risks, benefits and other options for treatment, the patient has consented to  Procedure(s): LEFT BELOW KNEE AMPUTATION (Left) as a surgical intervention.  The patient's history has been reviewed, patient examined, no change in status, stable for surgery.  I have reviewed the patient's chart and labs.  Questions were answered to the patient's satisfaction.     Newt Minion

## 2019-11-13 NOTE — Plan of Care (Signed)

## 2019-11-13 NOTE — Anesthesia Procedure Notes (Addendum)
Anesthesia Regional Block: Popliteal block   Pre-Anesthetic Checklist: ,, timeout performed, Correct Patient, Correct Site, Correct Laterality, Correct Procedure, Correct Position, site marked, Risks and benefits discussed,  Surgical consent,  Pre-op evaluation,  At surgeon's request and post-op pain management  Laterality: Left  Prep: chloraprep       Needles:  Injection technique: Single-shot  Needle Type: Echogenic Stimulator Needle     Needle Length: 10cm      Additional Needles:   Procedures:,,,, ultrasound used (permanent image in chart),,,,  Narrative:  Start time: 11/13/2019 10:55 AM End time: 11/13/2019 11:00 AM Injection made incrementally with aspirations every 5 mL.  Performed by: Personally  Anesthesiologist: Merlinda Frederick, MD  Additional Notes: A functioning IV was confirmed and monitors were applied.  Sterile prep and drape, hand hygiene and sterile gloves were used.  Negative aspiration and test dose prior to incremental administration of local anesthetic. The patient tolerated the procedure well.Ultrasound  guidance: relevant anatomy identified, needle position confirmed, local anesthetic spread visualized around nerve(s), vascular puncture avoided.  Image printed for medical record.

## 2019-11-13 NOTE — Progress Notes (Addendum)
Inpatient Diabetes Program Recommendations  AACE/ADA: New Consensus Statement on Inpatient Glycemic Control (2015)  Target Ranges:  Prepandial:   less than 140 mg/dL      Peak postprandial:   less than 180 mg/dL (1-2 hours)      Critically ill patients:  140 - 180 mg/dL   Lab Results  Component Value Date   GLUCAP 146 (H) 11/13/2019   HGBA1C 8.1 (H) 07/10/2019    Review of Glycemic Control Results for Anthony Bullock, Anthony Bullock (MRN [id number]) as of 11/13/2019 14:19  Ref. Range 11/13/2019 10:16 11/13/2019 12:23 11/13/2019 13:25  Glucose-Capillary Latest Ref Range: 70 - 99 mg/dL 158 (H) 140 (H) 146 (H)   Diabetes history: DM 2 Outpatient Diabetes medications:  Basaglar 70 units q HS, Humalog 2-14 units tid with meals Current orders for Inpatient glycemic control:  None Inpatient Diabetes Program Recommendations:   Please consider adding Lantus 35 units q HS, Novolog 4 units tid with meals (hold if patient eats less than 50% or NPO), and Novolog sensitive tid with meals and HS.   Orders received from [place], PA.   Will follow.   Thanks Adah Perl, RN, BC-ADM Inpatient Diabetes Coordinator Pager [phone] (8a-5p)

## 2019-11-13 NOTE — Progress Notes (Signed)
Orthopedic Tech Progress Note Patient Details:  Anthony Bullock [birth year] [id number] Called in order to [facility] for a VIVE PROTOCOL BK Patient ID: Anthony Bullock, male   DOB: Nov 14, 1960, 59 y.o.   MRN: [id number]   [name] 11/13/2019, 4:51 PM

## 2019-11-13 NOTE — OR Nursing (Signed)
Pt is awake,alert and oriented.Pt and/or family verbalized understanding of poc and discharge instructions. Reviewed admission and on going care with receiving RN. Pt is in NAD at this time and is ready to be transferred to floor. Will con't to monitor until pt is transferred. Belongings on bed with patient  

## 2019-11-13 NOTE — Anesthesia Procedure Notes (Signed)
Procedure Name: MAC Date/Time: 11/13/2019 12:34 PM Performed by: Reece Agar, CRNA Pre-anesthesia Checklist: Patient identified, Emergency Drugs available, Suction available and Patient being monitored Patient Re-evaluated:Patient Re-evaluated prior to induction Oxygen Delivery Method: Simple face mask

## 2019-11-14 ENCOUNTER — Encounter (HOSPITAL_COMMUNITY): Payer: Self-pay | Admitting: Orthopedic Surgery

## 2019-11-14 LAB — RENAL FUNCTION PANEL
Albumin: 3 g/dL — ABNORMAL LOW (ref 3.5–5.0)
Anion gap: 12 (ref 5–15)
BUN: 65 mg/dL — ABNORMAL HIGH (ref 6–20)
CO2: 24 mmol/L (ref 22–32)
Calcium: 8.6 mg/dL — ABNORMAL LOW (ref 8.9–10.3)
Chloride: 100 mmol/L (ref 98–111)
Creatinine, Ser: 11.29 mg/dL — ABNORMAL HIGH (ref 0.61–1.24)
GFR, Estimated: 5 mL/min — ABNORMAL LOW (ref >60)
Glucose, Bld: 104 mg/dL — ABNORMAL HIGH (ref 70–99)
Phosphorus: 3.4 mg/dL (ref 2.5–4.6)
Potassium: 5.6 mmol/L — ABNORMAL HIGH (ref 3.5–5.1)
Sodium: 136 mmol/L (ref 135–145)

## 2019-11-14 LAB — CBC
HCT: 35.1 % — ABNORMAL LOW (ref 39.0–52.0)
Hemoglobin: 11.1 g/dL — ABNORMAL LOW (ref 13.0–17.0)
MCH: 28.5 pg (ref 26.0–34.0)
MCHC: 31.6 g/dL (ref 30.0–36.0)
MCV: 90 fL (ref 80.0–100.0)
Platelets: 148 10*3/uL — ABNORMAL LOW (ref 150–400)
RBC: 3.9 MIL/uL — ABNORMAL LOW (ref 4.22–5.81)
RDW: 17.1 % — ABNORMAL HIGH (ref 11.5–15.5)
WBC: 11.1 10*3/uL — ABNORMAL HIGH (ref 4.0–10.5)
nRBC: 0 % (ref 0.0–0.2)

## 2019-11-14 LAB — GLUCOSE, CAPILLARY
Glucose-Capillary: 85 mg/dL (ref 70–99)
Glucose-Capillary: 81 mg/dL (ref 70–99)
Glucose-Capillary: 127 mg/dL — ABNORMAL HIGH (ref 70–99)
Glucose-Capillary: 141 mg/dL — ABNORMAL HIGH (ref 70–99)

## 2019-11-14 LAB — HEPATITIS B SURFACE ANTIGEN: Hepatitis B Surface Ag: NONREACTIVE

## 2019-11-14 MED ORDER — HEPARIN SODIUM (PORCINE) 1000 UNIT/ML IJ SOLN
INTRAMUSCULAR | Status: AC
  Administered 2019-11-14: 3800 [IU] via INTRAVENOUS
  Filled 2019-11-14: qty 4

## 2019-11-14 MED ORDER — HEPARIN SODIUM (PORCINE) 1000 UNIT/ML IJ SOLN
1000.0000 [IU] | INTRAMUSCULAR | Status: DC | PRN
  Filled 2019-11-14: qty 1

## 2019-11-14 NOTE — Evaluation (Signed)
Occupational Therapy Evaluation Patient Details Name: Anthony Bullock MRN: [id number] DOB: 27-Jan-1960 Today's Date: 11/14/2019    History of Present Illness The patient is a 59 year old gentleman seen status post left foot fifth ray amputation on August 21, 2019. Unfortunately had some dehiscence of his incision and underwent L BKA. PMH: COPD, CHF, DM, ESRD on HD, NICM, HTN, R BKA   Clinical Impression   PTA, pt lives with wife and after initial L foot amputation, pt required assistance for transfers and ADLs. Pt reports using RW primarily in the home, wheelchair outside of the home. Wheelchair will not fit in all of the doorways at home. Pt presents now s/p L BKA with deficits in strength, endurance, balance and pain. Pt overall Mod A for bed mobility to sit EOB and assistance to maintain dynamic sitting balance EOB. Pt Min A for scooting along bedside but limited from further transfers secondary to IV dislodging from R hand as pt pushing through B UE to scoot (RN notified and attending). Pt requires Min A for UB ADLs and Max A for LB ADLs at this time.   Based on current presentation, believe pt will benefit from CIR prior to return home to maximize safety and reduce caregiver strain. However, plan to see pt tomorrow on non-dialysis day as pt also limited by fatigue today. Pt and wife would like for pt to return home with [facility] therapy. Educated on possible barriers and safety risks with going home at this time. Will monitor and update recommendations during tomorrow's session.      Follow Up Recommendations  Other (comment) (CIR vs HH pending progression - to be further assessed)    Equipment Recommendations  3 in 1 bedside commode;Wheelchair cushion (measurements OT) (drop arm BSC, gel or air wheelchair cushion)    Recommendations for Other Services Rehab consult     Precautions / Restrictions Precautions Precautions: Fall Precaution Comments: wound vac, hx of R BKA with  prosthetic Restrictions Weight Bearing Restrictions: Yes      Mobility Bed Mobility Overal bed mobility: Needs Assistance Bed Mobility: Supine to Sit;Sit to Supine     Supine to sit: Mod assist;HOB elevated Sit to supine: Mod assist   General bed mobility comments: Mod A overall, assistance needed to advance trunk and guide trunk safely back into bed. Cued for use of bed rail    Transfers Overall transfer level: Needs assistance   Transfers: Lateral/Scoot Transfers           General transfer comment: Attempted scooting along bedside at MIn A, difficulty maintaining balance. Limited by further transfers due to IV dislodging from pt hand when scooting    Balance Overall balance assessment: Needs assistance Sitting-balance support: Bilateral upper extremity supported;Feet supported Sitting balance-Leahy Scale: Fair Sitting balance - Comments: min guard for static sitting balance, difficulty maintaining balance with challenges                                   ADL either performed or assessed with clinical judgement   ADL Overall ADL's : Needs assistance/impaired Eating/Feeding: Set up;Sitting   Grooming: Sitting;Min guard   Upper Body Bathing: Minimal assistance;Sitting   Lower Body Bathing: Maximal assistance;Sitting/lateral leans   Upper Body Dressing : Minimal assistance;Sitting Upper Body Dressing Details (indicate cue type and reason): Min A to doff/don new gown Lower Body Dressing: Maximal assistance;Bed level;Sitting/lateral leans       Toileting- Clothing Manipulation  and Hygiene: Maximal assistance;Sitting/lateral lean;Bed level         General ADL Comments: Pt limited by pain, decreased balance and decreased strength impacting ability to complete ADLs without physical assist     Vision Baseline Vision/History: Wears glasses Wears Glasses: Reading only Patient Visual Report: No change from baseline Vision Assessment?: No apparent  visual deficits     Perception     Praxis      Pertinent Vitals/Pain Pain Assessment: 0-10 Pain Score: 10-Worst pain ever Pain Location: L residual limb Pain Descriptors / Indicators: Grimacing;Guarding;Operative site guarding Pain Intervention(s): Limited activity within patient's tolerance;Monitored during session;Premedicated before session;Patient requesting pain meds-RN notified;RN gave pain meds during session;Ice applied     Hand Dominance Right   Extremity/Trunk Assessment Upper Extremity Assessment Upper Extremity Assessment: Generalized weakness   Lower Extremity Assessment Lower Extremity Assessment: Defer to PT evaluation   Cervical / Trunk Assessment Cervical / Trunk Assessment: Normal   Communication Communication Communication: No difficulties   Cognition Arousal/Alertness: Awake/alert (then became lethargic as pain meds kicked in) Behavior During Therapy: WFL for tasks assessed/performed Overall Cognitive Status: Within Functional Limits for tasks assessed                                 General Comments: Pleasant and cooperative. became lethargic with flat affect at end of session when pain meds kicked in   General Comments  Pt's wife present during session. Discussed further rehab vs going home, home setup, equipment needs and physical assist needed. Plan to further assess transfers tomorrow as session limited by IV (not connected or in use) dislodging from pt's hand while scooting along bedside. RN notified and assisting    Exercises     Shoulder Instructions      Home Living Family/patient expects to be discharged to:: Private residence Living Arrangements: Spouse/significant other Available Help at Discharge: Family;Available PRN/intermittently Type of Home: House Home Access: Ramped entrance (steep ramp)     Home Layout: One level         Bathroom Toilet: Handicapped height (BSC over toilet) Bathroom Accessibility: No    Home Equipment: Walker - 2 wheels;Wheelchair - [employer];Other (comment) (sliding board)   Additional Comments: Wheelchair will not fit in bathroom/bedroom doorways. Wife works full time, but reports able to work from home as needed. Have some church members that can assist as needed      Prior Functioning/Environment Level of Independence: Needs assistance  Gait / Transfers Assistance Needed: used wheelchair/RW to get around in the house. Wife pushed pt in wheelchair in/out of the home ADL's / Homemaking Assistance Needed: Wife assisted with sponge bathing, toilet transfers   Comments: Prior to recent foot amputation, pt Independent in all daily tasks, was walking with R prosthetic LE and no AD. Has required increased assistance for ADLs/transfers since foot amputations        OT Problem List: Decreased strength;Decreased activity tolerance;Impaired balance (sitting and/or standing);Decreased knowledge of use of DME or AE;Pain      OT Treatment/Interventions: Self-care/ADL training;Therapeutic exercise;DME and/or AE instruction;Therapeutic activities;Patient/family education;Balance training    OT Goals(Current goals can be found in the care plan section) Acute Rehab OT Goals Patient Stated Goal: be able to drive again OT Goal Formulation: With patient/family Time For Goal Achievement: 11/28/19 Potential to Achieve Goals: Good ADL Goals Pt Will Perform Grooming: with modified independence;sitting Pt Will Perform Lower Body Bathing: with min assist;sitting/lateral leans Pt  Will Perform Lower Body Dressing: with min assist;sitting/lateral leans Pt Will Transfer to Toilet: with min assist;with transfer board;anterior/posterior transfer;bedside commode Pt Will Perform Toileting - Clothing Manipulation and hygiene: with min assist;sitting/lateral leans Pt/caregiver will Perform Home Exercise Program: Increased strength;Both right and left upper extremity;With  theraband;Independently;With written HEP provided  OT Frequency: Min 2X/week   Barriers to D/C:            Co-evaluation              AM-PAC OT "6 Clicks" Daily Activity     Outcome Measure Help from another person eating meals?: A Little Help from another person taking care of personal grooming?: A Little Help from another person toileting, which includes using toliet, bedpan, or urinal?: A Lot Help from another person bathing (including washing, rinsing, drying)?: A Lot Help from another person to put on and taking off regular upper body clothing?: A Little Help from another person to put on and taking off regular lower body clothing?: A Lot 6 Click Score: 15   End of Session Nurse Communication: Mobility status;Patient requests pain meds  Activity Tolerance: Patient limited by pain;Patient limited by fatigue Patient left: in bed;with call bell/phone within reach;with bed alarm set;with nursing/sitter in room;with family/visitor present  OT Visit Diagnosis: Unsteadiness on feet (R26.81);Other abnormalities of gait and mobility (R26.89);Muscle weakness (generalized) (M62.81);Pain Pain - Right/Left: Left Pain - part of body: Leg                Time: 1250-1331 OT Time Calculation (min): 41 min Charges:  OT General Charges $OT Visit: 1 Visit OT Evaluation $OT Eval Moderate Complexity: 1 Mod OT Treatments $Self Care/Home Management : 8-22 mins $Therapeutic Activity: 8-22 mins  Layla Maw, OTR/L  Layla Maw 11/14/2019, 1:55 PM

## 2019-11-14 NOTE — Progress Notes (Signed)
PT Cancellation Note  Patient Details Name: GRANVEL PROUDFOOT MRN: [id number] DOB: Oct 31, 1960   Cancelled Treatment:    Reason Eval/Treat Not Completed: Patient at procedure or test/unavailable (HD). Will follow-up for PT Evaluation as schedule permits.  Mabeline Caras, PT, DPT Acute Rehabilitation Services  Pager [phone] Office [place] 11/14/2019, 7:30 AM

## 2019-11-14 NOTE — Progress Notes (Signed)
Patient ID: Anthony Bullock, male   DOB: 1960/06/05, 60 y.o.   MRN: [id number] Patient is postoperative day 1 left transtibial amputation he has a limb protector from [place].  There is no drainage in the wound VAC canister there is 1 check.  Plan for physical therapy and discharge planning pending recommendations from physical therapy.

## 2019-11-14 NOTE — Progress Notes (Signed)
[facility] Progress Note  Subjective: seen on HD, no c/o  Vitals:   11/14/19 1000 11/14/19 1030 11/14/19 1040 11/14/19 1059  BP: (!) 109/57 (!) 123/59 (!) 134/58 (!) 149/60  Pulse: 67 67 68 72  Resp:   16 17  Temp:   98.7 F (37.1 C) 98 F (36.7 C)  TempSrc:   Oral Oral  SpO2:   98% 99%  Weight:   82.1 kg   Height:        Exam: Gen alert, no distress, post op in pacu No rash, cyanosis or gangrene Sclera anicteric, throat clear  No jvd or bruits Chest clear bilat to bases no rales or wheezing RRR no MRG, L axillary region +ICD sq Abd soft ntnd no mass or ascites +bs GU normal male MS bilat BKA, large wound vac on L bka, no sig leg edema Neuro is alert, Ox 3 , nf  RIJ TDC intact    Home meds:  - norvasc qd / asa / lipitor qd/ coreg 25 bid/ fenofibrate qd/ lasix 120 qd/ ntg patch qd  - fosrenol 2gm ac tid/ sensipar 30 tiw po/ rena-vit qd  - lantus insulin 70 u hs/ lispro 10u tid ac  - neurontin 600 bid/ allegra 180 qd  - prn's/ vitamins/ supplements   OP HD: [facility] MWF   3h 13min  400/600  2.2/5 bath  TDC RIJ  83kg  Hep 1000+ 500/hr  - hect 2.5 ug tiw  - epo 4000u IV tiw  - sensipar 30 po tiw   Assessment/ Plan: 1. PAD s/p L BKA - on 11/3 per Dr Sharol Given 2. ESRD - on HD MWF in [place], [place]. HD today off sched for Wed, then HD tomorrow.  3. IDDM - per pmd 4. Anemia ckd - Hb 11. No esa needed here 5. MBD ckd - cont sensipar, hectorol, binder 6. BP/ volume - BP wnl, close to dry wt today 7. H/o chron systolic CHF/ sp AICD      Rob Jericca Russett 11/14/2019, 1:24 PM   Recent Labs  Lab 11/13/19 1033 11/14/19 0651  K 5.0 5.6*  BUN 51* 65*  CREATININE 9.90* 11.29*  CALCIUM  --  8.6*  PHOS  --  3.4  HGB 14.6 11.1*   Inpatient medications: . amLODipine  10 mg Oral Daily  . aspirin EC  81 mg Oral Daily  . atorvastatin  80 mg Oral QHS  . carvedilol  25 mg Oral BID WC  . Chlorhexidine Gluconate Cloth  6 each Topical Q0600  . [START ON  11/15/2019] cinacalcet  30 mg Oral Q M,W,F-HD  . docusate sodium  100 mg Oral BID  . [START ON 11/15/2019] doxercalciferol  2.5 mcg Intravenous Q M,W,F-HD  . fenofibrate  160 mg Oral Daily  . furosemide  120 mg Oral Daily  . gabapentin  600 mg Oral BID  . insulin aspart  0-5 Units Subcutaneous QHS  . insulin aspart  0-9 Units Subcutaneous TID WC  . insulin aspart  4 Units Subcutaneous TID WC  . insulin glargine  35 Units Subcutaneous QHS  . lanthanum  2,000 mg Oral TID with meals  . lanthanum  500 mg Oral With snacks  . loratadine  10 mg Oral Daily   . sodium chloride 75 mL/hr at 11/13/19 1735   acetaminophen, albuterol, azelastine, heparin sodium (porcine), HYDROmorphone (DILAUDID) injection, metoCLOPramide **OR** metoCLOPramide (REGLAN) injection, ondansetron **OR** ondansetron (ZOFRAN) IV, oxyCODONE

## 2019-11-14 NOTE — Progress Notes (Signed)
Inpatient Rehab Admissions Coordinator Note:   Per therapy recommendations, pt was screened for CIR candidacy by Shann Medal, PT, DPT.  At this time we are recommending a CIR consult.  If pt would like to be considered, please place an IP Rehab MD Consult order.  Please contact me with questions.   Shann Medal, PT, DPT [phone] 11/14/19 4:27 PM

## 2019-11-14 NOTE — Progress Notes (Signed)
OT Cancellation Note  Patient Details Name: Anthony Bullock MRN: [id number] DOB: Mar 03, 1960   Cancelled Treatment:    Reason Eval/Treat Not Completed: Patient at procedure or test/ unavailable. Pt off unit at HD. Will follow-up for OT eval as appropriate.  Layla Maw 11/14/2019, 7:28 AM

## 2019-11-15 LAB — GLUCOSE, CAPILLARY
Glucose-Capillary: 78 mg/dL (ref 70–99)
Glucose-Capillary: 93 mg/dL (ref 70–99)
Glucose-Capillary: 113 mg/dL — ABNORMAL HIGH (ref 70–99)
Glucose-Capillary: 195 mg/dL — ABNORMAL HIGH (ref 70–99)

## 2019-11-15 LAB — RENAL FUNCTION PANEL
Albumin: 3 g/dL — ABNORMAL LOW (ref 3.5–5.0)
Anion gap: 13 (ref 5–15)
BUN: 34 mg/dL — ABNORMAL HIGH (ref 6–20)
CO2: 26 mmol/L (ref 22–32)
Calcium: 8.6 mg/dL — ABNORMAL LOW (ref 8.9–10.3)
Chloride: 97 mmol/L — ABNORMAL LOW (ref 98–111)
Creatinine, Ser: 8.48 mg/dL — ABNORMAL HIGH (ref 0.61–1.24)
GFR, Estimated: 7 mL/min — ABNORMAL LOW (ref >60)
Glucose, Bld: 93 mg/dL (ref 70–99)
Phosphorus: 4.3 mg/dL (ref 2.5–4.6)
Potassium: 4.6 mmol/L (ref 3.5–5.1)
Sodium: 136 mmol/L (ref 135–145)

## 2019-11-15 LAB — CBC
HCT: 36.3 % — ABNORMAL LOW (ref 39.0–52.0)
Hemoglobin: 11.3 g/dL — ABNORMAL LOW (ref 13.0–17.0)
MCH: 28.4 pg (ref 26.0–34.0)
MCHC: 31.1 g/dL (ref 30.0–36.0)
MCV: 91.2 fL (ref 80.0–100.0)
Platelets: 151 10*3/uL (ref 150–400)
RBC: 3.98 MIL/uL — ABNORMAL LOW (ref 4.22–5.81)
RDW: 17.3 % — ABNORMAL HIGH (ref 11.5–15.5)
WBC: 9 10*3/uL (ref 4.0–10.5)
nRBC: 0 % (ref 0.0–0.2)

## 2019-11-15 MED ORDER — HEPARIN SODIUM (PORCINE) 1000 UNIT/ML IJ SOLN
INTRAMUSCULAR | Status: AC
  Filled 2019-11-15: qty 4

## 2019-11-15 MED ORDER — ACETAMINOPHEN 325 MG PO TABS: 325.0000 mg | ORAL_TABLET | Freq: Four times a day (QID) | ORAL | Status: DC | PRN

## 2019-11-15 MED ORDER — OXYCODONE HCL 10 MG PO TABS
10.0000 mg | ORAL_TABLET | ORAL | 0 refills | Status: DC | PRN
Start: 2019-11-15 — End: 2019-11-29

## 2019-11-15 MED ORDER — CINACALCET HCL 30 MG PO TABS
30.0000 mg | ORAL_TABLET | ORAL | Status: DC
Start: 2019-11-15 — End: 2019-11-29

## 2019-11-15 MED ORDER — DOCUSATE SODIUM 100 MG PO CAPS
100.0000 mg | ORAL_CAPSULE | Freq: Two times a day (BID) | ORAL | 0 refills | Status: DC
Start: 2019-11-15 — End: 2019-11-29

## 2019-11-15 MED ORDER — DOXERCALCIFEROL 4 MCG/2ML IV SOLN
2.5000 ug | INTRAVENOUS | Status: DC
Start: 2019-11-15 — End: 2019-11-29

## 2019-11-15 MED ORDER — INSULIN GLARGINE 100 UNIT/ML ~~LOC~~ SOLN
25.0000 [IU] | Freq: Every day | SUBCUTANEOUS | Status: DC
  Administered 2019-11-15: 25 [IU] via SUBCUTANEOUS
  Filled 2019-11-15 (×2): qty 0.25

## 2019-11-15 NOTE — Progress Notes (Signed)
Inpatient Rehab Admissions:  Inpatient Rehab Consult received.  I met with patient and his wife at the bedside for rehabilitation assessment and to discuss goals and expectations of an inpatient rehab admission.  We discussed supervision to mod I goals with estimated length of stay 10-12 days.  I reviewed medicare coverage with them and they are agreeable to proceed with admission.    I have a bed for this pt to admit to CIR on Saturday (11/6).  Bevely Palmer Persons, PA-C, in agreement.  Rehab MD (Dr. Ranell Patrick) to assess pt and confirm admission on Saturday.  Floor RN can call CIR at [phone] for report after 12pm on Saturday.  I have let pt/family and case manager know.    Shann Medal, PT, DPT Admissions Coordinator [phone] 11/15/19  12:09 PM

## 2019-11-15 NOTE — Progress Notes (Signed)
[facility] Progress Note   Subjective:   Seen and examined at bedside. Feeling well, denies SOB, BP, palpitations, dizziness, GI upset. Net UF 1.5L with HD yesterday.  Objective Vitals:   11/14/19 1459 11/14/19 1934 11/15/19 0522 11/15/19 0759  BP: (!) 148/64 (!) 140/56 (!) 147/60 136/78  Pulse: 82 72 78 82  Resp: 17 17 16 17   Temp: 98.2 F (36.8 C) 98.8 F (37.1 C) 98.6 F (37 C) 98.4 F (36.9 C)  TempSrc: Oral Oral Oral Oral  SpO2: 98% 95% 95% 97%  Weight:      Height:       Physical Exam General: Well developed, alert, in NAD Heart: RRR, no murmurs, rubs or gallops  Lungs:CTA bilaterally without wheezing, rhonchi or rales Abdomen: Soft, non-tender, non-distended, +BS  Extremities: B/l BKA, L stump with wound vac Dialysis Access: R IJ [facility]  Additional Objective Labs: Basic Metabolic Panel: Recent Labs  Lab 11/13/19 1033 11/14/19 0651  NA 138 136  K 5.0 5.6*  CL 99 100  CO2  --  24  GLUCOSE 130* 104*  BUN 51* 65*  CREATININE 9.90* 11.29*  CALCIUM  --  8.6*  PHOS  --  3.4   Liver Function Tests: Recent Labs  Lab 11/14/19 0651  ALBUMIN 3.0*   CBC: Recent Labs  Lab 11/13/19 1033 11/14/19 0651 11/15/19 0804  WBC  --  11.1* 9.0  HGB 14.6 11.1* 11.3*  HCT 43.0 35.1* 36.3*  MCV  --  90.0 91.2  PLT  --  148* 151   Blood Culture    Component Value Date/Time   SDES BLOOD ARTERIAL LINE 09/03/2019 1642   SPECREQUEST  09/03/2019 1642    BOTTLES DRAWN AEROBIC AND ANAEROBIC Blood Culture adequate volume   CULT  09/03/2019 1642    NO GROWTH 5 DAYS Performed at [facility], [address], [place], [place] [address]    REPTSTATUS 09/08/2019 FINAL 09/03/2019 1642   CBG: Recent Labs  Lab 11/14/19 0632 11/14/19 1137 11/14/19 1658 11/14/19 1935 11/15/19 0656  GLUCAP 85 81 127* 141* 78   Medications: . sodium chloride 75 mL/hr at 11/13/19 1735   . amLODipine  10 mg Oral Daily  . aspirin EC  81 mg Oral Daily  . atorvastatin  80 mg  Oral QHS  . carvedilol  25 mg Oral BID WC  . Chlorhexidine Gluconate Cloth  6 each Topical Q0600  . cinacalcet  30 mg Oral Q M,W,F-HD  . docusate sodium  100 mg Oral BID  . doxercalciferol  2.5 mcg Intravenous Q M,W,F-HD  . fenofibrate  160 mg Oral Daily  . furosemide  120 mg Oral Daily  . gabapentin  600 mg Oral BID  . heparin sodium (porcine)      . insulin aspart  0-5 Units Subcutaneous QHS  . insulin aspart  0-9 Units Subcutaneous TID WC  . insulin aspart  4 Units Subcutaneous TID WC  . insulin glargine  35 Units Subcutaneous QHS  . lanthanum  2,000 mg Oral TID with meals  . lanthanum  500 mg Oral With snacks  . loratadine  10 mg Oral Daily    Dialysis Orders: [facility] TTS 3h 40min 400/600 2.2/5 bath TDC RIJ 83kg Hep 1000+ 500/hr - hect 2.5 ug tiw - epo 4000u IV tiw - sensipar 30 po tiw   Assessment/Plan: 1. PAD: S/p L BKA on 11/3 by Dr. Sharol Given 2. ESRD: per pt, dialyzes on TTS schedule. Completed HD yesterday without issues, next  HD tomorrow.  3. HTN/volume:  No edema on exam. Post HD weight 82kg yesterday, EDW likely needs to be decreased about 2-2.5kg after BKA. Holding heparin tomorrow (post op) 4. Anemia: Hgb 11.3. No ESA indicated at this time.  5. Secondary hyperparathyroidism:  Calcium and phosphorus controlled. Continue hectorol, sensipar, and  binder 6. Nutrition:  On a carb modified diet. Per patient, breakfast was not renal-friendly. Will change to renal/carb modified diet.   Anice Paganini, PA-C 11/15/2019, 10:07 AM  [facility] Pager: [phone]

## 2019-11-15 NOTE — PMR Pre-admission (Signed)
PMR Admission Coordinator Pre-Admission Assessment   Patient: Anthony Bullock is an 59 y.o., male MRN: [id number] DOB: 1960/09/24 Height: 5' 10" (177.8 cm) Weight: 82.1 kg   Insurance Information HMO:     PPO:      PCP:      IPA:      80/20:      OTHER:  PRIMARY: Medicare A and B      Policy#: [id number]      Subscriber: pt CM Name:       Phone#:      Fax#:  Pre-Cert#: verified Office manager:  Benefits:  Phone #:      Name:  Eff. Date: 03/10/17 A and B     Deduct: $1484      Out of Pocket Max: n/a      Life Max: n/a CIR: 100%      SNF: 20 full days Outpatient: 80%     Co-Pay: 20% Home Health: 100%      Co-Pay:  DME: 80%     Co-Pay: 20% Providers: pt choice SECONDARY: BCBS Supplement      Policy#: [id number]     Phone#: [phone]   Financial Counselor:       Phone#:    The "Data Collection Information Summary" for patients in Inpatient Rehabilitation Facilities with attached "Privacy Act [place] Records" was provided and verbally reviewed with: Patient and Family   Emergency Contact Information         Contact Information     Name Relation Home Work Mobile    [place] Spouse [phone]   [phone]         Current Medical History  Patient Admitting Diagnosis: L BKA   History of Present Illness: Pt is a 59 y/o male with PMH of DM with neuropathy and nephropathy, ESRD (HD TRS), NICM s/p AICD, and R BKA (he has a prosthesis for this).  He was admitted 11/13/19 for below-knee amputation per Dr. Sharol Given for management of non-healing wound of L foot osteomyelitis.  Post-op course pain management.  Therapy evaluations were completed and pt was recommended for CIR.     Patient's medical record from [facility] has been reviewed by the rehabilitation admission coordinator and physician.   Past Medical History      Past Medical History:  Diagnosis Date  . AICD (automatic cardioverter/defibrillator) present      [employer]  . Allergic  rhinitis    . Anemia    . Arthritis    . Chronic systolic heart failure ([place])      a. ECHO (12/2012) EF 25-30%, HK entireanteroseptal myocardium //  b.  EF 25%, diffuse HK, grade 1 diastolic dysfunction, MAC, mild LAE, normal RVSF, trivial pericardial effusion  . COPD (chronic obstructive pulmonary disease) ([place])    . Diabetes mellitus type II    . Diabetic nephropathy ([place])    . Diabetic neuropathy ([place])    . ESRD on hemodialysis [facility])      started HD June 2017, goes to [facility] HD unit, Dr Hinda Lenis  . History of cardiac catheterization      a.Myoview 1/15:  There is significant left ventricular dysfunction. There may be slight scar at the apex. There is no significant ischemia. LV Ejection Fraction: 27%  //  b. RHC/LHC (1/15) with mean RA 6, PA 47/22 mean 33, mean PCWP 20, PVR 2.5 WU, CI 2.5; 80% dLAD stenosis, 70% diffuse large D.    [name]  History of kidney stones    . Hyperlipidemia    . Hypertension    . Kidney stones    . NICM (nonischemic cardiomyopathy) ([place])      Primarily nonischemic.  Echo (12/14) with EF 25-30%.  Echo (3/15) with EF 25%, mild to moderately dilated LV, normal RV size and systolic function.    . Osteomyelitis ([place])      left fifth ray  . Pneumonia    . Urethral stricture    . Wears glasses        Family History   family history includes Alcohol abuse in his father; Bladder Cancer in his mother; Diabetes in his maternal grandmother; Heart Problems in his maternal grandmother; Heart disease in his maternal grandfather; Melanoma in his father; Prostate cancer in his maternal grandfather; Stroke in his maternal grandmother.   Prior Rehab/Hospitalizations Has the patient had prior rehab or hospitalizations prior to admission? Yes   Has the patient had major surgery during 100 days prior to admission? Yes              Current Medications   Current Facility-Administered Medications:  .  acetaminophen (TYLENOL) tablet 325-650 mg, 325-650 mg, Oral, Q6H PRN,  Persons, Bevely Palmer, PA, 650 mg at 11/14/19 1332 .  albuterol (VENTOLIN HFA) 108 (90 Base) MCG/ACT inhaler 2 puff, 2 puff, Inhalation, Q4H PRN, Persons, Bevely Palmer, PA .  amLODipine (NORVASC) tablet 10 mg, 10 mg, Oral, Daily, Persons, Bevely Palmer, [place], 10 mg at 11/15/19 0909 .  aspirin EC tablet 81 mg, 81 mg, Oral, Daily, Persons, Bevely Palmer, [place], 81 mg at 11/15/19 0908 .  atorvastatin (LIPITOR) tablet 80 mg, 80 mg, Oral, QHS, Persons, Bevely Palmer, [place], 80 mg at 11/14/19 2211 .  azelastine (ASTELIN) 0.1 % nasal spray 2 spray, 2 spray, Each Nare, TID PRN, Persons, Bevely Palmer, PA .  carvedilol (COREG) tablet 25 mg, 25 mg, Oral, BID WC, Persons, Bevely Palmer, [place], 25 mg at 11/15/19 0909 .  Chlorhexidine Gluconate Cloth 2 % PADS 6 each, 6 each, Topical, Q0600, Roney Jaffe, MD .  cinacalcet (SENSIPAR) tablet 30 mg, 30 mg, Oral, Q M,W,F-HD, Roney Jaffe, MD .  docusate sodium (COLACE) capsule 100 mg, 100 mg, Oral, BID, Persons, Bevely Palmer, [place], 100 mg at 11/14/19 2211 .  doxercalciferol (HECTOROL) injection 2.5 mcg, 2.5 mcg, Intravenous, Q M,W,F-HD, Roney Jaffe, MD .  fenofibrate tablet 160 mg, 160 mg, Oral, Daily, Persons, Bevely Palmer, [place], 160 mg at 11/15/19 0911 .  furosemide (LASIX) tablet 120 mg, 120 mg, Oral, Daily, Persons, Bevely Palmer, PA, 120 mg at 11/15/19 0909 .  gabapentin (NEURONTIN) capsule 600 mg, 600 mg, Oral, BID, Persons, Bevely Palmer, PA, 600 mg at 11/15/19 0909 .  heparin sodium (porcine) 1000 UNIT/ML injection, , , ,  .  HYDROmorphone (DILAUDID) injection 0.5 mg, 0.5 mg, Intravenous, Q4H PRN, Persons, Bevely Palmer, PA, 0.5 mg at 11/15/19 0425 .  insulin aspart (novoLOG) injection 0-5 Units, 0-5 Units, Subcutaneous, QHS, Persons, Bevely Palmer, [place], 2 Units at 11/13/19 2349 .  insulin aspart (novoLOG) injection 0-9 Units, 0-9 Units, Subcutaneous, TID WC, Persons, Bevely Palmer, [place], 1 Units at 11/14/19 1718 .  insulin aspart (novoLOG) injection 4 Units, 4 Units, Subcutaneous, TID WC, Persons, Bevely Palmer, [place], 4  Units at 11/14/19 1719 .  insulin glargine (LANTUS) injection 25 Units, 25 Units, Subcutaneous, QHS, Newt Minion, MD .  lanthanum [facility]) chewable tablet 2,000 mg, 2,000 mg, Oral, TID with meals, Persons, Bevely Palmer, PA, 2,000  mg at 11/15/19 1237 .  lanthanum (FOSRENOL) chewable tablet 500 mg, 500 mg, Oral, With snacks, Blenda Nicely, RPH, 500 mg at 11/15/19 0910 .  loratadine (CLARITIN) tablet 10 mg, 10 mg, Oral, Daily, Persons, Bevely Palmer, PA, 10 mg at 11/15/19 0910 .  metoCLOPramide (REGLAN) tablet 5-10 mg, 5-10 mg, Oral, Q8H PRN **OR** metoCLOPramide (REGLAN) injection 5-10 mg, 5-10 mg, Intravenous, Q8H PRN, Persons, Bevely Palmer, PA .  ondansetron [facility]) tablet 4 mg, 4 mg, Oral, Q6H PRN, 4 mg at 11/14/19 1718 **OR** ondansetron (ZOFRAN) injection 4 mg, 4 mg, Intravenous, Q6H PRN, Persons, Bevely Palmer, PA .  oxyCODONE (Oxy IR/ROXICODONE) immediate release tablet 10-15 mg, 10-15 mg, Oral, Q4H PRN, Persons, Bevely Palmer, PA, 15 mg at 11/15/19 1310   Patients Current Diet:     Diet Order                      Diet renal/carb modified with fluid restriction Diet-HS Snack? Nothing; Fluid restriction: 1200 mL Fluid; Room service appropriate? Yes; Fluid consistency: Thin  Diet effective now              Diet - low sodium heart healthy                      Precautions / Restrictions Precautions Precautions: Fall, Other (comment) Precaution Comments: L BKA wound vac and limb guard; h/o R BKA (prosthetic in room) Restrictions Weight Bearing Restrictions: Yes LLE Weight Bearing: Non weight bearing Other Position/Activity Restrictions: NWB L LE    Has the patient had 2 or more falls or a fall with injury in the past year? No   Prior Activity Level Limited Community (1-2x/wk): w/c or RW for household mobility, wife pushed w/c for community distances, not driving, does have a R BKA prosthetic   Prior Functional Level Self Care: Did the patient need help bathing, dressing, using the toilet  or eating? Independent   Indoor Mobility: Did the patient need assistance with walking from room to room (with or without device)? Independent   Stairs: Did the patient need assistance with internal or external stairs (with or without device)? Dependent   Functional Cognition: Did the patient need help planning regular tasks such as shopping or remembering to take medications? [place] / Equipment Home Assistive Devices/Equipment: Environmental consultant (specify type), Eyeglasses, CBG Meter, Blood pressure cuff, Prosthesis Home Equipment: Walker - 2 wheels, Wheelchair - manual, Bedside commode, Other (comment) (slide board)   Prior Device Use: Indicate devices/aids used by the patient prior to current illness, exacerbation or injury? Manual wheelchair, Walker and Orthotics/Prosthetics   Current Functional Level Cognition   Overall Cognitive Status: Impaired/Different from baseline Current Attention Level: Selective Orientation Level: Oriented X4 General Comments: WFL for majority of tasks, although easily distracted and moving fast requiring cues to stay on task    Extremity Assessment (includes Sensation/Coordination)   Upper Extremity Assessment: Generalized weakness (difficulty with fine motor control, tremulous hand/finger movements)  Lower Extremity Assessment: RLE deficits/detail, LLE deficits/detail RLE Deficits / Details: s/p BKA in 2018; hip and knee >3/5 LLE Deficits / Details: s/p new BKA; hip flex/ext/abd/add at least 3/5, knee flex/ext ~3/5 although tremulous with movement and limited by wound vac     ADLs   Overall ADL's : Needs assistance/impaired Eating/Feeding: Set up, Sitting Grooming: Sitting, Min guard Upper Body Bathing: Minimal assistance, Sitting Lower Body Bathing: Maximal assistance, Sitting/lateral leans Upper Body Dressing : Minimal assistance, Sitting  Upper Body Dressing Details (indicate cue type and reason): Min A to doff/don new  gown Lower Body Dressing: Moderate assistance, Sitting/lateral leans Lower Body Dressing Details (indicate cue type and reason): Pt overall min guard for donning R prosthetic sitting EOB to maintain balance. Pt will require increased assist for donning pants/underwear but will do better with lateral leans. Standing for ADLs unsafe at this time Toileting- Clothing Manipulation and Hygiene: Maximal assistance, Sitting/lateral lean, Bed level General ADL Comments: Improved pain control, still limited by weakness and decreased balance     Mobility   Overal bed mobility: Needs Assistance Bed Mobility: Supine to Sit Supine to sit: Mod assist, HOB elevated Sit to supine: Mod assist General bed mobility comments: Received sitting in recliner; heavy reliance on BUE support to translate weight anteriorly in chair     Transfers   Overall transfer level: Needs assistance Equipment used: 2 person hand held assist, Rolling walker (2 wheeled), None Transfers: Sit to/from Stand, Radiographer, therapeutic Sit to Stand: Max assist, +2 physical assistance, +2 safety/equipment Anterior-Posterior transfers: Min assist General transfer comment: Initially attempted sit to stand transfers in prep for pivot to chair; initial attempt with maxA+2 and bilat HHA but pt unable to maintain balance; additional attempt with RW, still requiring maxA+2 to maintain standing balance with posterior lean. Opted for AP transfer from bed <> recliner with minA to assist scooting with bed pad     Ambulation / Gait / Stairs / Wheelchair Mobility         Posture / Balance Dynamic Sitting Balance Sitting balance - Comments: Difficulty maintaining balance in recliner without back support when legs elevated to don/doff limb guard, reliant on UE support at times Balance Overall balance assessment: Needs assistance Sitting-balance support: Bilateral upper extremity supported, Feet supported Sitting balance-Leahy Scale: Fair Sitting  balance - Comments: Difficulty maintaining balance in recliner without back support when legs elevated to don/doff limb guard, reliant on UE support at times Postural control: Posterior lean Standing balance support: Bilateral upper extremity supported Standing balance-Leahy Scale: Zero Standing balance comment: Max A x2 for standing balance with RW and R prosthetic, heavy posterior lean     Special needs/care consideration Skin L BKA incision, Diabetic management yes and Designated visitor [place] (from acute therapy documentation) Living Arrangements: Spouse/significant other Available Help at Discharge: Family, Available PRN/intermittently Type of Home: House Home Layout: One level Home Access: Ramped entrance (steep ramp) Bathroom Toilet: Handicapped height Bathroom Accessibility: No Home Care Services: No Additional Comments: Wheelchair will not fit in bathroom/bedroom doorways. Wife works full time, but reports able to work from home as needed. Have some church members that can assist as needed   Discharge Living Setting Plans for Discharge Living Setting: Patient's home Type of Home at Discharge: House Discharge Home Layout: One level Discharge Home Access: Ramped entrance (steep ramp or 4 SE) Discharge Bathroom Shower/Tub:  (pt sponge bathes at baseline) Discharge Bathroom Toilet: Handicapped height Discharge Bathroom Accessibility: No Does the patient have any problems obtaining your medications?: No   Social/Family/Support Systems Patient Roles: Spouse Anticipated Caregiver: wife, Shahab Polhamus Anticipated Caregiver's Contact Information: [phone] Ability/Limitations of Caregiver: works from home, can provide only supervision to min guard Caregiver Availability: 24/7 Discharge Plan Discussed with Primary Caregiver: Yes Is Caregiver In Agreement with Plan?: Yes Does Caregiver/Family have Issues with Lodging/Transportation while Pt is  in Rehab?: No   Goals Patient/Family Goal for Rehab: PT/OT supervision to mod I, w/c level Expected  length of stay: 10-12 days Additional Information: HD TRS, old R BKA, has a prosthetic Pt/Family Agrees to Admission and willing to participate: Yes Program Orientation Provided & Reviewed with Pt/Caregiver Including Roles  & Responsibilities: Yes   Decrease burden of Care through IP rehab admission: n/a   Possible need for SNF placement upon discharge: Not anticipated   Patient Condition: I have reviewed medical records from [facility], spoken with CM, and patient and spouse. I met with patient at the bedside for inpatient rehabilitation assessment.  Patient will benefit from ongoing PT and OT, can actively participate in 3 hours of therapy a day 5 days of the week, and can make measurable gains during the admission.  Patient will also benefit from the coordinated team approach during an Inpatient Acute Rehabilitation admission.  The patient will receive intensive therapy as well as Rehabilitation physician, nursing, social worker, and care management interventions.  Due to safety, skin/wound care, disease management, medication administration, pain management and patient education the patient requires 24 hour a day rehabilitation nursing.  The patient is currently mod to max with mobility and basic ADLs.  Discharge setting and therapy post discharge at home with home health is anticipated.  Patient has agreed to participate in the Acute Inpatient Rehabilitation Program and will admit today.   Preadmission Screen Completed By:  Michel Santee, PT, DPT 11/15/2019 3:19 PM ______________________________________________________________________   Discussed status with Dr. Ranell Patrick on 11/15/19  at 3:19 PM  and received approval for admission today.   Admission Coordinator:  Michel Santee, PT, DPT time 3:19 PM Sudie Grumbling 11/15/19     Assessment/Plan: Diagnosis: Left BKA 1. Does the need for  close, 24 hr/day Medical supervision in concert with the patient's rehab needs make it unreasonable for this patient to be served in a less intensive setting? Yes 2. Co-Morbidities requiring supervision/potential complications: ESRD on HD, NICM s/p AICD, Type 2 DM, overweight (BMI 26.32), hyperkalemia 3. Due to bladder management, bowel management, safety, skin/wound care, disease management, medication administration, pain management and patient education, does the patient require 24 hr/day rehab nursing? Yes 4. Does the patient require coordinated care of a physician, rehab nurse, PT, OT to address physical and functional deficits in the context of the above medical diagnosis(es)? Yes Addressing deficits in the following areas: balance, endurance, locomotion, strength, transferring, bowel/bladder control, bathing, dressing, feeding, grooming, toileting and psychosocial support 5. Can the patient actively participate in an intensive therapy program of at least 3 hrs of therapy 5 days a week? Yes 6. The potential for patient to make measurable gains while on inpatient rehab is excellent 7. Anticipated functional outcomes upon discharge from inpatient rehab: modified independent PT, modified independent OT, independent SLP 8. Estimated rehab length of stay to reach the above functional goals is: 12-16 days 9. Anticipated discharge destination: Home 10. Overall Rehab/Functional Prognosis: excellent     MD Signature: Leeroy Cha, MD

## 2019-11-15 NOTE — Plan of Care (Signed)

## 2019-11-15 NOTE — Progress Notes (Signed)
Occupational Therapy Treatment Patient Details Name: Anthony Bullock MRN: [id number] DOB: 1960/11/27 Today's Date: 11/15/2019    History of present illness The patient is a 59 year old gentleman seen status post left foot fifth ray amputation on August 21, 2019. Unfortunately had some dehiscence of his incision and underwent L BKA. PMH: COPD, CHF, DM, ESRD on HD, NICM, HTN, R BKA   OT comments  Pt progressing towards OT goals and remains motivated to participate. Pain more controlled and wife present during session again today. Pt continues to require Mod A for bed mobility to sit EOB and Min A initially to gain sitting balance. Pt able to don prosthetic LE with min guard sitting EOB to trial standing with RW. Pt required Max A x 2 for sit to stand with RW, unable to gain balance with Max A required to correct posterior lean. Pt agreeable to trial AP transfer to recliner chair with Min A at most for this transfer type. Provided UE HEP with theraband and provided demonstration of exercises with plan to assess carryover, as well as BSC transfers during next session.   At beginning of session, pt expressed interest in CIR after talking with wife. Also, after noting the physical assist needed to stand today, both pt/wife in agreeable with CIR recommendation. Strongly recommend CIR and believe pt will progress well with intensive therapies.   Follow Up Recommendations  CIR    Equipment Recommendations  3 in 1 bedside commode;Wheelchair cushion (measurements OT) (drop arm BSC, gel or air wheelchair cushion )    Recommendations for Other Services Rehab consult    Precautions / Restrictions Precautions Precautions: Fall Precaution Comments: wound vac, hx of R BKA with prosthetic Restrictions Weight Bearing Restrictions: Yes Other Position/Activity Restrictions: NWB L LE       Mobility Bed Mobility Overal bed mobility: Needs Assistance Bed Mobility: Supine to Sit     Supine to sit: Mod  assist;HOB elevated     General bed mobility comments: Mod A for trunk advancement and scooting R hip forward  Transfers Overall transfer level: Needs assistance Equipment used: Rolling walker (2 wheeled);None Transfers: Sit to/from Stand;Anterior-Posterior Transfer Sit to Stand: Max assist;+2 physical assistance;+2 safety/equipment     Anterior-Posterior transfers: Min assist   General transfer comment: Initially attempted sit to stand transfers in prep for pivot to chair with RW and R prosthetic. Pt Max A x 2 for sit to stand, Max A to maintain standing balance with posterior lean. Opted for AP transfer from bed <> recliner with Min A at most and cueing for sequencing    Balance Overall balance assessment: Needs assistance Sitting-balance support: Bilateral upper extremity supported;Feet supported Sitting balance-Leahy Scale: Fair Sitting balance - Comments: Initially Min A for sitting balance, progressing to min guard for safety due to posterior sway Postural control: Posterior lean Standing balance support: Bilateral upper extremity supported Standing balance-Leahy Scale: Zero Standing balance comment: Max A x2 for standing balance with RW and R prosthetic, heavy posterior lean                           ADL either performed or assessed with clinical judgement   ADL Overall ADL's : Needs assistance/impaired                     Lower Body Dressing: Moderate assistance;Sitting/lateral leans Lower Body Dressing Details (indicate cue type and reason): Pt overall min guard for donning R prosthetic sitting EOB  to maintain balance. Pt will require increased assist for donning pants/underwear but will do better with lateral leans. Standing for ADLs unsafe at this time               General ADL Comments: Improved pain control, still limited by weakness and decreased balance     Vision   Vision Assessment?: No apparent visual deficits   Perception      Praxis      Cognition Arousal/Alertness: Awake/alert Behavior During Therapy: WFL for tasks assessed/performed Overall Cognitive Status: Within Functional Limits for tasks assessed                                 General Comments: pleasant and cooperative        Exercises     Shoulder Instructions       General Comments Pt's wife present during session. On OT entry, pt apologizing and reports interest in CIR at this time. Wife in agreement. Provided UE HEP with yellow and green theraband and provided visual demonstration with pt to look over HEP later to maximize strength and assist with transfers     Pertinent Vitals/ Pain       Pain Assessment: Faces Faces Pain Scale: Hurts little more Pain Location: L residual limb Pain Descriptors / Indicators: Grimacing;Guarding;Operative site guarding Pain Intervention(s): Limited activity within patient's tolerance;Monitored during session;Premedicated before session  Home Living                                          Prior Functioning/Environment              Frequency  Min 2X/week        Progress Toward Goals  OT Goals(current goals can now be found in the care plan section)  Progress towards OT goals: Progressing toward goals  Acute Rehab OT Goals Patient Stated Goal: be able to drive again OT Goal Formulation: With patient/family Time For Goal Achievement: 11/28/19 Potential to Achieve Goals: Good ADL Goals Pt Will Perform Grooming: with modified independence;sitting Pt Will Perform Lower Body Bathing: with min assist;sitting/lateral leans Pt Will Perform Lower Body Dressing: with min assist;sitting/lateral leans Pt Will Transfer to Toilet: with min assist;with transfer board;anterior/posterior transfer;bedside commode Pt Will Perform Toileting - Clothing Manipulation and hygiene: with min assist;sitting/lateral leans Pt/caregiver will Perform Home Exercise Program: Increased  strength;Both right and left upper extremity;With theraband;Independently;With written HEP provided  Plan Discharge plan needs to be updated    Co-evaluation    PT/OT/SLP Co-Evaluation/Treatment: Yes Reason for Co-Treatment: For patient/therapist safety;To address functional/ADL transfers   OT goals addressed during session: ADL's and self-care      AM-PAC OT "6 Clicks" Daily Activity     Outcome Measure   Help from another person eating meals?: A Little Help from another person taking care of personal grooming?: A Little Help from another person toileting, which includes using toliet, bedpan, or urinal?: A Lot Help from another person bathing (including washing, rinsing, drying)?: A Lot Help from another person to put on and taking off regular upper body clothing?: A Little Help from another person to put on and taking off regular lower body clothing?: A Lot 6 Click Score: 15    End of Session Equipment Utilized During Treatment: Gait belt;Rolling walker  OT Visit Diagnosis: Unsteadiness on feet (R26.81);Other  abnormalities of gait and mobility (R26.89);Muscle weakness (generalized) (M62.81);Pain Pain - Right/Left: Left Pain - part of body: Leg   Activity Tolerance Patient tolerated treatment well   Patient Left in chair;with call bell/phone within reach;with chair alarm set;with family/visitor present   Nurse Communication Mobility status        Time: 0355-9741 OT Time Calculation (min): 26 min  Charges: OT General Charges $OT Visit: 1 Visit OT Treatments $Therapeutic Activity: 8-22 mins  Layla Maw, OTR/L   Layla Maw 11/15/2019, 11:28 AM

## 2019-11-15 NOTE — Evaluation (Signed)
Physical Therapy Evaluation Patient Details Name: Anthony Bullock MRN: [id number] DOB: 08-19-1960 Today's Date: 11/15/2019   History of Present Illness  Pt is a 59 y.o. male with recent L 5th ray amputation (08/21/19), now admitted 11/13/19 with wound dehiscence, s/p L BKA 11/3. PMH includes R BKA (04/2016), COPD, CHF, DM, ESRD (on HD), HTN, NICM.    Clinical Impression  Pt presents with an overall decrease in functional mobility secondary to above. PTA, pt mod indep with RW and wheelchair, lives with wife who assists as needed. Educ on L BKA precautions, positioning, and importance of mobility. Today, pt able to initiate transfer training, requiring maxA+2 to stand with RLE prosthetic, less assist needed with anterior-posterior transfer to recliner. Pt would benefit from intensive CIR-level therapies to maximize functional mobility and independence prior to return home; pt and wife in agreement. Pt highly motivated to regain PLOF and future LLE prosthetic use.    Follow Up Recommendations CIR;Supervision for mobility/OOB    Equipment Recommendations   (defer)    Recommendations for Other Services Rehab consult     Precautions / Restrictions Precautions Precautions: Fall;Other (comment) Precaution Comments: L BKA wound vac; h/o R BKA (prosthetic in room) Restrictions Weight Bearing Restrictions: Yes LLE Weight Bearing: Non weight bearing Other Position/Activity Restrictions: NWB L LE      Mobility  Bed Mobility Overal bed mobility: Needs Assistance Bed Mobility: Supine to Sit     Supine to sit: Mod assist;HOB elevated     General bed mobility comments: Mod A for trunk elevation and scooting R hip forward    Transfers Overall transfer level: Needs assistance Equipment used: 2 person hand held assist;Rolling walker (2 wheeled);None Transfers: Sit to/from Stand;Anterior-Posterior Transfer Sit to Stand: Max assist;+2 physical assistance;+2 safety/equipment      Anterior-Posterior transfers: Min assist   General transfer comment: Initially attempted sit to stand transfers in prep for pivot to chair; initial attempt with maxA+2 and bilat HHA but pt unable to maintain balance; additional attempt with RW, still requiring maxA+2 to maintain standing balance with posterior lean. Opted for AP transfer from bed <> recliner with minA to assist scooting with bed pad  Ambulation/Gait                Stairs            Wheelchair Mobility    Modified Rankin (Stroke Patients Only)       Balance Overall balance assessment: Needs assistance Sitting-balance support: Bilateral upper extremity supported;Feet supported Sitting balance-Leahy Scale: Fair Sitting balance - Comments: Initially Min A for sitting balance, progressing to min guard for safety due to posterior sway Postural control: Posterior lean Standing balance support: Bilateral upper extremity supported Standing balance-Leahy Scale: Zero Standing balance comment: Max A x2 for standing balance with RW and R prosthetic, heavy posterior lean                             Pertinent Vitals/Pain Pain Assessment: Faces Faces Pain Scale: Hurts little more Pain Location: L residual limb, lower back Pain Descriptors / Indicators: Grimacing;Guarding;Operative site guarding Pain Intervention(s): Monitored during session;Repositioned;Premedicated before session    Home Living Family/patient expects to be discharged to:: Private residence Living Arrangements: Spouse/significant other Available Help at Discharge: Family;Available PRN/intermittently Type of Home: House Home Access: Ramped entrance (steep ramp)     Home Layout: One level Home Equipment: Walker - 2 wheels;Wheelchair - [employer];Other (comment) (slide board) Additional Comments:  Wheelchair will not fit in bathroom/bedroom doorways. Wife works full time, but reports able to work from home as needed. Have  some church members that can assist as needed    Prior Function Level of Independence: Needs assistance   Gait / Transfers Assistance Needed: used wheelchair/RW to get around in the house. Wife pushed pt in wheelchair in/out of the home  ADL's / Homemaking Assistance Needed: Wife assisted with sponge bathing, toilet transfers  Comments: Prior to recent foot amputation, pt Independent in all daily tasks, was walking with R prosthetic LE and no AD. Has required increased assistance for ADLs/transfers since foot amputations     Hand Dominance   Dominant Hand: Right    Extremity/Trunk Assessment   Upper Extremity Assessment Upper Extremity Assessment: Generalized weakness (difficulty with fine motor control, tremulous hand/finger movements)    Lower Extremity Assessment Lower Extremity Assessment: RLE deficits/detail;LLE deficits/detail RLE Deficits / Details: s/p BKA in 2018; hip and knee >3/5 LLE Deficits / Details: s/p new BKA; hip flex/ext/abd/add at least 3/5, knee flex/ext ~3/5 although tremulous with movement and limited by wound vac       Communication   Communication: No difficulties  Cognition Arousal/Alertness: Awake/alert Behavior During Therapy: WFL for tasks assessed/performed Overall Cognitive Status: Impaired/Different from baseline Area of Impairment: Attention                   Current Attention Level: Selective           General Comments: WFL for majority of tasks, although easily distracted and moving fast requiring cues to redirect to task at hand and slow down      General Comments General comments (skin integrity, edema, etc.): Pt and wife not interested in [place] - secure chat sent to [facility]    Exercises     Assessment/Plan    PT Assessment Patient needs continued PT services  PT Problem List Decreased range of motion;Decreased activity tolerance;Decreased strength;Decreased balance;Decreased mobility;Decreased knowledge of use of  DME;Decreased knowledge of precautions;Pain       PT Treatment Interventions DME instruction;Functional mobility training;Therapeutic activities;Gait training;Therapeutic exercise;Balance training;Cognitive remediation;Patient/family education;Wheelchair mobility training    PT Goals (Current goals can be found in the Care Plan section)  Acute Rehab PT Goals Patient Stated Goal: be able to drive again PT Goal Formulation: With patient Time For Goal Achievement: 11/29/19 Potential to Achieve Goals: Fair    Frequency Min 3X/week   Barriers to discharge        Co-evaluation   Reason for Co-Treatment: For patient/therapist safety;To address functional/ADL transfers   OT goals addressed during session: ADL's and self-care       AM-PAC PT "6 Clicks" Mobility  Outcome Measure Help needed turning from your back to your side while in a flat bed without using bedrails?: A Little Help needed moving from lying on your back to sitting on the side of a flat bed without using bedrails?: A Lot Help needed moving to and from a bed to a chair (including a wheelchair)?: A Little Help needed standing up from a chair using your arms (e.g., wheelchair or bedside chair)?: Total Help needed to walk in hospital room?: Total Help needed climbing 3-5 steps with a railing? : Total 6 Click Score: 11    End of Session Equipment Utilized During Treatment: Gait belt Activity Tolerance: Patient tolerated treatment well Patient left: in chair;with call bell/phone within reach;with chair alarm set;with family/visitor present Nurse Communication: Mobility status PT Visit Diagnosis: Other abnormalities  of gait and mobility (R26.89)    Time: 9233-0076 PT Time Calculation (min) (ACUTE ONLY): 25 min   Charges:   PT Evaluation $PT Eval Moderate Complexity: [place], PT, DPT Acute Rehabilitation Services  Pager [phone] [facility] [place] 11/15/2019, 12:39  PM

## 2019-11-15 NOTE — Progress Notes (Signed)
Inpatient Diabetes Program Recommendations  AACE/ADA: New Consensus Statement on Inpatient Glycemic Control (2015)  Target Ranges:  Prepandial:   less than 140 mg/dL      Peak postprandial:   less than 180 mg/dL (1-2 hours)      Critically ill patients:  140 - 180 mg/dL   Lab Results  Component Value Date   GLUCAP 78 11/15/2019   HGBA1C 6.5 (H) 11/13/2019    Review of Glycemic Control Results for Anthony Bullock, Anthony Bullock (MRN [id number]) as of 11/15/2019 09:55  Ref. Range 11/14/2019 06:32 11/14/2019 11:37 11/14/2019 16:58 11/14/2019 19:35 11/15/2019 06:56  Glucose-Capillary Latest Ref Range: 70 - 99 mg/dL 85 81 127 (H) 141 (H) 78   Diabetes history: DM 2 Outpatient Diabetes medications:  Basaglar 70 units q HS, Humalog 2-14 units tid with meals Current orders for Inpatient glycemic control: Lantus 35 units + Humalog 4 units tid meal coverage + Novolog sensitive correction tid + hs 0-5 units  Inpatient Diabetes Program Recommendations:   -Decrease Lantus 25 units q hs -Decrease Novolog meal coverage to 2 units tid if eats 50% Secure chat to Dr. Sharol Given.  Thank you, Nani Gasser. Antinio Sanderfer, RN, MSN, CDE  Diabetes Coordinator Inpatient Glycemic Control Team Team Pager [phone] (8am-5pm) 11/15/2019 10:02 AM

## 2019-11-15 NOTE — Progress Notes (Signed)
Patient is postop day 2 status post below-knee knee amputation.  He is sitting up in bed he does have some difficulties with pain control.  He has not worked with PT much.   Vital signs stable afebrile.  Patient is pleasant to examination wound VAC is functioning with 1 green track 0 cc in the canister   Patient was recommended to inpatient rehab but he does not want to go inpatient rehab he states very clearly to me today that he wants to be discharged with home health.  Therefore I will not place a consult.  Still needs to work with physical therapy had dialysis yesterday.  Plan for discharge to family with home health in 1 to 2 days

## 2019-11-15 NOTE — Progress Notes (Signed)
Physical Therapy Treatment Patient Details Name: Anthony Bullock MRN: [id number] DOB: Apr 06, 1960 Today's Date: 11/15/2019    History of Present Illness Pt is a 59 y.o. male with recent L 5th ray amputation (08/21/19), now admitted 11/13/19 with wound dehiscence, s/p L BKA 11/3. PMH includes R BKA (04/2016), COPD, CHF, DM, ESRD (on HD), HTN, NICM.   PT Comments    Pt seen for additional session related to therex and education. Pt with poor trunk control and seated balance requiring assist to don/doff limb guard; wife present for education. Performed seated BLE therex ([place] HEP handout provided). Educ re: phantom limb pain, positioning/edema control, limb guard wear, precautions, and importance of mobility. Continue to recommend intensive CIR-level therapies; pt motivated to participate.   Follow Up Recommendations  CIR;Supervision for mobility/OOB     Equipment Recommendations   (defer)    Recommendations for Other Services Rehab consult     Precautions / Restrictions Precautions Precautions: Fall;Other (comment) Precaution Comments: L BKA wound vac and limb guard; h/o R BKA (prosthetic in room) Restrictions Weight Bearing Restrictions: Yes LLE Weight Bearing: Non weight bearing Other Position/Activity Restrictions: NWB L LE    Mobility  Bed Mobility Overal bed mobility: Needs Assistance Bed Mobility: Supine to Sit     Supine to sit: Mod assist;HOB elevated     General bed mobility comments: Received sitting in recliner; heavy reliance on BUE support to translate weight anteriorly in chair  Transfers Overall transfer level: Needs assistance Equipment used: 2 person hand held assist;Rolling walker (2 wheeled);None Transfers: Sit to/from Stand;Anterior-Posterior Transfer Sit to Stand: Max assist;+2 physical assistance;+2 safety/equipment     Anterior-Posterior transfers: Min assist   General transfer comment: Initially attempted sit to stand transfers in prep for pivot  to chair; initial attempt with maxA+2 and bilat HHA but pt unable to maintain balance; additional attempt with RW, still requiring maxA+2 to maintain standing balance with posterior lean. Opted for AP transfer from bed <> recliner with minA to assist scooting with bed pad  Ambulation/Gait                 Stairs             Wheelchair Mobility    Modified Rankin (Stroke Patients Only)       Balance Overall balance assessment: Needs assistance Sitting-balance support: Bilateral upper extremity supported;Feet supported Sitting balance-Leahy Scale: Fair Sitting balance - Comments: Difficulty maintaining balance in recliner without back support when legs elevated to don/doff limb guard, reliant on UE support at times Postural control: Posterior lean Standing balance support: Bilateral upper extremity supported Standing balance-Leahy Scale: Zero Standing balance comment: Max A x2 for standing balance with RW and R prosthetic, heavy posterior lean                            Cognition Arousal/Alertness: Awake/alert Behavior During Therapy: WFL for tasks assessed/performed Overall Cognitive Status: Impaired/Different from baseline Area of Impairment: Attention                   Current Attention Level: Selective           General Comments: WFL for majority of tasks, although easily distracted and moving fast requiring cues to stay on task      Exercises Amputee Exercises Quad Sets: AROM;Both;Seated Hip ABduction/ADduction: AROM;Both;Seated Knee Flexion: AROM;Both;Seated Knee Extension: AROM;Both;Seated Straight Leg Raises: AROM;Both;Seated Other Exercises Other Exercises: [place] HEP handout provided (Access Code [email or web address])  General Comments General comments (skin integrity, edema, etc.): Session focused on BKA therex and education, including limb guard wear (pt able to don/doff with assist - limited by impaired balance); wife present for  education      Pertinent Vitals/Pain Pain Assessment: Faces Faces Pain Scale: Hurts little more Pain Location: L residual limb, lower back Pain Descriptors / Indicators: Grimacing;Guarding;Operative site guarding Pain Intervention(s): Monitored during session;Limited activity within patient's tolerance    Home Living Family/patient expects to be discharged to:: Private residence Living Arrangements: Spouse/significant other Available Help at Discharge: Family;Available PRN/intermittently Type of Home: House Home Access: Ramped entrance (steep ramp)   Home Layout: One level Home Equipment: Walker - 2 wheels;Wheelchair - [employer];Other (comment) (slide board) Additional Comments: Wheelchair will not fit in bathroom/bedroom doorways. Wife works full time, but reports able to work from home as needed. Have some church members that can assist as needed    Prior Function Level of Independence: Needs assistance  Gait / Transfers Assistance Needed: used wheelchair/RW to get around in the house. Wife pushed pt in wheelchair in/out of the home ADL's / Homemaking Assistance Needed: Wife assisted with sponge bathing, toilet transfers Comments: Prior to recent foot amputation, pt Independent in all daily tasks, was walking with R prosthetic LE and no AD. Has required increased assistance for ADLs/transfers since foot amputations   PT Goals (current goals can now be found in the care plan section) Acute Rehab PT Goals Patient Stated Goal: be able to drive again PT Goal Formulation: With patient Time For Goal Achievement: 11/29/19 Potential to Achieve Goals: Fair Progress towards PT goals: Progressing toward goals    Frequency    Min 3X/week      PT Plan Current plan remains appropriate    Co-evaluation   Reason for Co-Treatment: For patient/therapist safety;To address functional/ADL transfers   OT goals addressed during session: ADL's and self-care      AM-PAC PT  "6 Clicks" Mobility   Outcome Measure  Help needed turning from your back to your side while in a flat bed without using bedrails?: A Little Help needed moving from lying on your back to sitting on the side of a flat bed without using bedrails?: A Lot Help needed moving to and from a bed to a chair (including a wheelchair)?: A Little Help needed standing up from a chair using your arms (e.g., wheelchair or bedside chair)?: Total Help needed to walk in hospital room?: Total Help needed climbing 3-5 steps with a railing? : Total 6 Click Score: 11    End of Session Equipment Utilized During Treatment: Gait belt Activity Tolerance: Patient tolerated treatment well;Patient limited by fatigue Patient left: in chair;with call bell/phone within reach;with chair alarm set;with family/visitor present Nurse Communication: Mobility status PT Visit Diagnosis: Other abnormalities of gait and mobility (R26.89)     Time: 1610-9604 PT Time Calculation (min) (ACUTE ONLY): 19 min  Charges:  $Therapeutic Exercise: 8-22 mins                    Mabeline Caras, PT, DPT Acute Rehabilitation Services  Pager [phone] Office [place] 11/15/2019, 1:43 PM

## 2019-11-15 NOTE — Consult Note (Addendum)
   [facility] Inpatient Consult   11/15/2019  TOBIAH CELESTINE Apr 10, 1960 [id number]   Patient chart reviewed for potential [place] [facility] CM) services due to high unplanned readmission risk score, 34%.   Hospital liaison will continue to follow for progression and disposition. Please place a [facility] CM referral if appropriate.  Of note, [name] Care Management services does not replace or interfere with any services that are arranged by inpatient case management or social work.  [facility] Follow up: 12:28pm  Per review, patient current disposition is for CIR. No [facility] CM needs at this time.    Netta Cedars, MSN, [facility] Liaison Nurse Mobile Phone [phone]  Toll free office [phone]

## 2019-11-16 ENCOUNTER — Inpatient Hospital Stay (HOSPITAL_COMMUNITY)
Admission: RE | Admit: 2019-11-16 | Discharge: 2019-11-29 | DRG: 559 | Disposition: A | Discharge status: Home Health | Payer: Medicare Other | Source: Intra-hospital | Attending: Physical Medicine and Rehabilitation | Admitting: Physical Medicine and Rehabilitation

## 2019-11-16 ENCOUNTER — Other Ambulatory Visit: Payer: Self-pay

## 2019-11-16 DIAGNOSIS — N2581 Secondary hyperparathyroidism of renal origin: Secondary | ICD-10-CM | POA: Diagnosis present

## 2019-11-16 DIAGNOSIS — E440 Moderate protein-calorie malnutrition: Secondary | ICD-10-CM | POA: Diagnosis present

## 2019-11-16 DIAGNOSIS — L89152 Pressure ulcer of sacral region, stage 2: Secondary | ICD-10-CM | POA: Diagnosis present

## 2019-11-16 DIAGNOSIS — Z833 Family history of diabetes mellitus: Secondary | ICD-10-CM

## 2019-11-16 DIAGNOSIS — S88112A Complete traumatic amputation at level between knee and ankle, left lower leg, initial encounter: Secondary | ICD-10-CM | POA: Diagnosis not present

## 2019-11-16 DIAGNOSIS — I739 Peripheral vascular disease, unspecified: Secondary | ICD-10-CM | POA: Diagnosis not present

## 2019-11-16 DIAGNOSIS — I959 Hypotension, unspecified: Secondary | ICD-10-CM | POA: Diagnosis not present

## 2019-11-16 DIAGNOSIS — L89322 Pressure ulcer of left buttock, stage 2: Secondary | ICD-10-CM | POA: Diagnosis present

## 2019-11-16 DIAGNOSIS — Z8052 Family history of malignant neoplasm of bladder: Secondary | ICD-10-CM

## 2019-11-16 DIAGNOSIS — Z4781 Encounter for orthopedic aftercare following surgical amputation: Principal | ICD-10-CM

## 2019-11-16 DIAGNOSIS — Z79899 Other long term (current) drug therapy: Secondary | ICD-10-CM | POA: Diagnosis not present

## 2019-11-16 DIAGNOSIS — Z89512 Acquired absence of left leg below knee: Secondary | ICD-10-CM

## 2019-11-16 DIAGNOSIS — Z87891 Personal history of nicotine dependence: Secondary | ICD-10-CM | POA: Diagnosis not present

## 2019-11-16 DIAGNOSIS — I132 Hypertensive heart and chronic kidney disease with heart failure and with stage 5 chronic kidney disease, or end stage renal disease: Secondary | ICD-10-CM | POA: Diagnosis present

## 2019-11-16 DIAGNOSIS — R269 Unspecified abnormalities of gait and mobility: Secondary | ICD-10-CM | POA: Diagnosis present

## 2019-11-16 DIAGNOSIS — Z8249 Family history of ischemic heart disease and other diseases of the circulatory system: Secondary | ICD-10-CM

## 2019-11-16 DIAGNOSIS — J449 Chronic obstructive pulmonary disease, unspecified: Secondary | ICD-10-CM | POA: Diagnosis present

## 2019-11-16 DIAGNOSIS — Z794 Long term (current) use of insulin: Secondary | ICD-10-CM

## 2019-11-16 DIAGNOSIS — Z823 Family history of stroke: Secondary | ICD-10-CM

## 2019-11-16 DIAGNOSIS — E663 Overweight: Secondary | ICD-10-CM | POA: Diagnosis present

## 2019-11-16 DIAGNOSIS — I5022 Chronic systolic (congestive) heart failure: Secondary | ICD-10-CM | POA: Diagnosis present

## 2019-11-16 DIAGNOSIS — E1169 Type 2 diabetes mellitus with other specified complication: Secondary | ICD-10-CM | POA: Diagnosis present

## 2019-11-16 DIAGNOSIS — E1129 Type 2 diabetes mellitus with other diabetic kidney complication: Secondary | ICD-10-CM | POA: Diagnosis present

## 2019-11-16 DIAGNOSIS — Z888 Allergy status to other drugs, medicaments and biological substances status: Secondary | ICD-10-CM | POA: Diagnosis not present

## 2019-11-16 DIAGNOSIS — N186 End stage renal disease: Secondary | ICD-10-CM

## 2019-11-16 DIAGNOSIS — K5900 Constipation, unspecified: Secondary | ICD-10-CM | POA: Diagnosis present

## 2019-11-16 DIAGNOSIS — Z7982 Long term (current) use of aspirin: Secondary | ICD-10-CM

## 2019-11-16 DIAGNOSIS — E1165 Type 2 diabetes mellitus with hyperglycemia: Secondary | ICD-10-CM | POA: Diagnosis present

## 2019-11-16 DIAGNOSIS — Z992 Dependence on renal dialysis: Secondary | ICD-10-CM

## 2019-11-16 DIAGNOSIS — L89312 Pressure ulcer of right buttock, stage 2: Secondary | ICD-10-CM | POA: Diagnosis present

## 2019-11-16 DIAGNOSIS — E1151 Type 2 diabetes mellitus with diabetic peripheral angiopathy without gangrene: Secondary | ICD-10-CM | POA: Diagnosis present

## 2019-11-16 DIAGNOSIS — Z885 Allergy status to narcotic agent status: Secondary | ICD-10-CM

## 2019-11-16 DIAGNOSIS — D631 Anemia in chronic kidney disease: Secondary | ICD-10-CM | POA: Diagnosis present

## 2019-11-16 DIAGNOSIS — L89102 Pressure ulcer of unspecified part of back, stage 2: Secondary | ICD-10-CM

## 2019-11-16 DIAGNOSIS — E875 Hyperkalemia: Secondary | ICD-10-CM | POA: Diagnosis present

## 2019-11-16 DIAGNOSIS — R34 Anuria and oliguria: Secondary | ICD-10-CM | POA: Diagnosis not present

## 2019-11-16 DIAGNOSIS — Z6825 Body mass index (BMI) 25.0-25.9, adult: Secondary | ICD-10-CM

## 2019-11-16 DIAGNOSIS — E1122 Type 2 diabetes mellitus with diabetic chronic kidney disease: Secondary | ICD-10-CM | POA: Diagnosis present

## 2019-11-16 DIAGNOSIS — T8781 Dehiscence of amputation stump: Secondary | ICD-10-CM | POA: Diagnosis not present

## 2019-11-16 DIAGNOSIS — M25562 Pain in left knee: Secondary | ICD-10-CM | POA: Diagnosis present

## 2019-11-16 DIAGNOSIS — Z811 Family history of alcohol abuse and dependence: Secondary | ICD-10-CM

## 2019-11-16 DIAGNOSIS — Z8042 Family history of malignant neoplasm of prostate: Secondary | ICD-10-CM

## 2019-11-16 DIAGNOSIS — Z808 Family history of malignant neoplasm of other organs or systems: Secondary | ICD-10-CM

## 2019-11-16 DIAGNOSIS — E1140 Type 2 diabetes mellitus with diabetic neuropathy, unspecified: Secondary | ICD-10-CM | POA: Diagnosis present

## 2019-11-16 DIAGNOSIS — R197 Diarrhea, unspecified: Secondary | ICD-10-CM | POA: Diagnosis not present

## 2019-11-16 DIAGNOSIS — I428 Other cardiomyopathies: Secondary | ICD-10-CM | POA: Diagnosis present

## 2019-11-16 DIAGNOSIS — G4700 Insomnia, unspecified: Secondary | ICD-10-CM | POA: Diagnosis present

## 2019-11-16 DIAGNOSIS — Z95810 Presence of automatic (implantable) cardiac defibrillator: Secondary | ICD-10-CM | POA: Diagnosis not present

## 2019-11-16 DIAGNOSIS — Z87442 Personal history of urinary calculi: Secondary | ICD-10-CM

## 2019-11-16 DIAGNOSIS — R1013 Epigastric pain: Secondary | ICD-10-CM | POA: Diagnosis present

## 2019-11-16 DIAGNOSIS — E1121 Type 2 diabetes mellitus with diabetic nephropathy: Secondary | ICD-10-CM | POA: Diagnosis present

## 2019-11-16 DIAGNOSIS — R112 Nausea with vomiting, unspecified: Secondary | ICD-10-CM | POA: Diagnosis not present

## 2019-11-16 DIAGNOSIS — E785 Hyperlipidemia, unspecified: Secondary | ICD-10-CM | POA: Diagnosis present

## 2019-11-16 LAB — RENAL FUNCTION PANEL
Albumin: 2.7 g/dL — ABNORMAL LOW (ref 3.5–5.0)
Anion gap: 12 (ref 5–15)
BUN: 54 mg/dL — ABNORMAL HIGH (ref 6–20)
CO2: 27 mmol/L (ref 22–32)
Calcium: 8.8 mg/dL — ABNORMAL LOW (ref 8.9–10.3)
Chloride: 98 mmol/L (ref 98–111)
Creatinine, Ser: 11.85 mg/dL — ABNORMAL HIGH (ref 0.61–1.24)
GFR, Estimated: 4 mL/min — ABNORMAL LOW (ref >60)
Glucose, Bld: 116 mg/dL — ABNORMAL HIGH (ref 70–99)
Phosphorus: 5.2 mg/dL — ABNORMAL HIGH (ref 2.5–4.6)
Potassium: 5.4 mmol/L — ABNORMAL HIGH (ref 3.5–5.1)
Sodium: 137 mmol/L (ref 135–145)

## 2019-11-16 LAB — CBC
HCT: 33.4 % — ABNORMAL LOW (ref 39.0–52.0)
Hemoglobin: 10.4 g/dL — ABNORMAL LOW (ref 13.0–17.0)
MCH: 29.1 pg (ref 26.0–34.0)
MCHC: 31.1 g/dL (ref 30.0–36.0)
MCV: 93.3 fL (ref 80.0–100.0)
Platelets: 144 10*3/uL — ABNORMAL LOW (ref 150–400)
RBC: 3.58 MIL/uL — ABNORMAL LOW (ref 4.22–5.81)
RDW: 17 % — ABNORMAL HIGH (ref 11.5–15.5)
WBC: 8.9 10*3/uL (ref 4.0–10.5)
nRBC: 0 % (ref 0.0–0.2)

## 2019-11-16 LAB — GLUCOSE, CAPILLARY
Glucose-Capillary: 98 mg/dL (ref 70–99)
Glucose-Capillary: 99 mg/dL (ref 70–99)
Glucose-Capillary: 167 mg/dL — ABNORMAL HIGH (ref 70–99)
Glucose-Capillary: 135 mg/dL — ABNORMAL HIGH (ref 70–99)

## 2019-11-16 MED ORDER — POLYETHYLENE GLYCOL 3350 17 G PO PACK: 17.0000 g | PACK | Freq: Every day | ORAL | Status: DC | PRN

## 2019-11-16 MED ORDER — CARVEDILOL 25 MG PO TABS
25.0000 mg | ORAL_TABLET | Freq: Two times a day (BID) | ORAL | Status: DC
  Administered 2019-11-16 – 2019-11-29 (×20): 25 mg via ORAL
  Filled 2019-11-16 (×23): qty 1

## 2019-11-16 MED ORDER — INSULIN ASPART 100 UNIT/ML ~~LOC~~ SOLN
2.0000 [IU] | Freq: Three times a day (TID) | SUBCUTANEOUS | Status: DC
  Administered 2019-11-16 – 2019-11-29 (×28): 2 [IU] via SUBCUTANEOUS

## 2019-11-16 MED ORDER — GUAIFENESIN-DM 100-10 MG/5ML PO SYRP: 5.0000 mL | ORAL_SOLUTION | Freq: Four times a day (QID) | ORAL | Status: DC | PRN

## 2019-11-16 MED ORDER — ACETAMINOPHEN 325 MG PO TABS
325.0000 mg | ORAL_TABLET | ORAL | Status: DC | PRN
  Administered 2019-11-18 – 2019-11-28 (×15): 650 mg via ORAL
  Filled 2019-11-16 (×17): qty 2

## 2019-11-16 MED ORDER — BISACODYL 10 MG RE SUPP: 10.0000 mg | Freq: Every day | RECTAL | Status: DC | PRN

## 2019-11-16 MED ORDER — HEPARIN SODIUM (PORCINE) 5000 UNIT/ML IJ SOLN
5000.0000 [IU] | Freq: Three times a day (TID) | INTRAMUSCULAR | Status: DC
  Administered 2019-11-16 – 2019-11-29 (×31): 5000 [IU] via SUBCUTANEOUS
  Filled 2019-11-16 (×34): qty 1

## 2019-11-16 MED ORDER — RENA-VITE PO TABS
1.0000 | ORAL_TABLET | Freq: Every day | ORAL | Status: DC
  Administered 2019-11-16 – 2019-11-28 (×13): 1 via ORAL
  Filled 2019-11-16 (×13): qty 1

## 2019-11-16 MED ORDER — PROCHLORPERAZINE 25 MG RE SUPP: 12.5000 mg | Freq: Four times a day (QID) | RECTAL | Status: DC | PRN

## 2019-11-16 MED ORDER — AZELASTINE HCL 0.1 % NA SOLN
2.0000 | Freq: Three times a day (TID) | NASAL | Status: DC | PRN
  Filled 2019-11-16: qty 30

## 2019-11-16 MED ORDER — HEPARIN SODIUM (PORCINE) 1000 UNIT/ML IJ SOLN
INTRAMUSCULAR | Status: AC
  Administered 2019-11-16: 1500 [IU] via INTRAVENOUS_CENTRAL
  Filled 2019-11-16: qty 4

## 2019-11-16 MED ORDER — OXYCODONE HCL 5 MG PO TABS
10.0000 mg | ORAL_TABLET | ORAL | Status: DC | PRN
  Administered 2019-11-16 (×2): 15 mg via ORAL
  Administered 2019-11-17 (×2): 10 mg via ORAL
  Administered 2019-11-17 (×2): 15 mg via ORAL
  Administered 2019-11-17: 10 mg via ORAL
  Administered 2019-11-17 – 2019-11-18 (×4): 15 mg via ORAL
  Administered 2019-11-19: 10 mg via ORAL
  Administered 2019-11-19: 15 mg via ORAL
  Administered 2019-11-20: 10 mg via ORAL
  Filled 2019-11-16 (×2): qty 3
  Filled 2019-11-16: qty 2
  Filled 2019-11-16 (×10): qty 3
  Filled 2019-11-16: qty 2
  Filled 2019-11-16 (×2): qty 3
  Filled 2019-11-16: qty 2

## 2019-11-16 MED ORDER — GABAPENTIN 100 MG PO CAPS
100.0000 mg | ORAL_CAPSULE | Freq: Every day | ORAL | Status: DC
  Administered 2019-11-17 – 2019-11-29 (×13): 100 mg via ORAL
  Filled 2019-11-16 (×13): qty 1

## 2019-11-16 MED ORDER — LANTHANUM CARBONATE 500 MG PO CHEW
500.0000 mg | CHEWABLE_TABLET | ORAL | Status: DC
  Administered 2019-11-17 – 2019-11-27 (×5): 500 mg via ORAL
  Filled 2019-11-16 (×37): qty 1

## 2019-11-16 MED ORDER — DIPHENHYDRAMINE HCL 12.5 MG/5ML PO ELIX: 12.5000 mg | ORAL_SOLUTION | Freq: Four times a day (QID) | ORAL | Status: DC | PRN

## 2019-11-16 MED ORDER — CHLORHEXIDINE GLUCONATE CLOTH 2 % EX PADS
6.0000 | MEDICATED_PAD | Freq: Every day | CUTANEOUS | Status: DC
  Administered 2019-11-17 – 2019-11-29 (×12): 6 via TOPICAL

## 2019-11-16 MED ORDER — SENNOSIDES-DOCUSATE SODIUM 8.6-50 MG PO TABS
2.0000 | ORAL_TABLET | Freq: Every day | ORAL | Status: DC
  Administered 2019-11-16: 2 via ORAL
  Filled 2019-11-16 (×2): qty 2

## 2019-11-16 MED ORDER — ALBUTEROL SULFATE HFA 108 (90 BASE) MCG/ACT IN AERS
2.0000 | INHALATION_SPRAY | RESPIRATORY_TRACT | Status: DC | PRN
  Filled 2019-11-16: qty 6.7

## 2019-11-16 MED ORDER — HYDROMORPHONE HCL 1 MG/ML IJ SOLN
INTRAMUSCULAR | Status: AC
  Filled 2019-11-16: qty 0.5

## 2019-11-16 MED ORDER — ATORVASTATIN CALCIUM 80 MG PO TABS
80.0000 mg | ORAL_TABLET | Freq: Every day | ORAL | Status: DC
  Administered 2019-11-16 – 2019-11-28 (×13): 80 mg via ORAL
  Filled 2019-11-16 (×13): qty 1

## 2019-11-16 MED ORDER — HEPARIN SODIUM (PORCINE) 1000 UNIT/ML DIALYSIS: 1500.0000 [IU] | INTRAMUSCULAR | Status: DC | PRN

## 2019-11-16 MED ORDER — AMLODIPINE BESYLATE 10 MG PO TABS
10.0000 mg | ORAL_TABLET | Freq: Every day | ORAL | Status: DC
  Administered 2019-11-17 – 2019-11-18 (×2): 10 mg via ORAL
  Filled 2019-11-16 (×2): qty 1

## 2019-11-16 MED ORDER — MAGNESIUM CITRATE PO SOLN
1.0000 | Freq: Once | ORAL | Status: AC
  Administered 2019-11-16: 1 via ORAL
  Filled 2019-11-16: qty 296

## 2019-11-16 MED ORDER — ONDANSETRON HCL 4 MG/2ML IJ SOLN
4.0000 mg | Freq: Four times a day (QID) | INTRAMUSCULAR | Status: DC | PRN
  Administered 2019-11-19: 4 mg via INTRAVENOUS
  Filled 2019-11-16: qty 2

## 2019-11-16 MED ORDER — DOXERCALCIFEROL 4 MCG/2ML IV SOLN
2.5000 ug | INTRAVENOUS | Status: DC
  Filled 2019-11-16: qty 2

## 2019-11-16 MED ORDER — FAMOTIDINE 20 MG PO TABS
10.0000 mg | ORAL_TABLET | Freq: Every day | ORAL | Status: DC
  Administered 2019-11-16 – 2019-11-23 (×8): 10 mg via ORAL
  Filled 2019-11-16 (×13): qty 1

## 2019-11-16 MED ORDER — LANTHANUM CARBONATE 500 MG PO CHEW
2000.0000 mg | CHEWABLE_TABLET | Freq: Three times a day (TID) | ORAL | Status: DC
  Administered 2019-11-16 – 2019-11-29 (×33): 2000 mg via ORAL
  Filled 2019-11-16 (×39): qty 4

## 2019-11-16 MED ORDER — PROCHLORPERAZINE MALEATE 5 MG PO TABS: 5.0000 mg | ORAL_TABLET | Freq: Four times a day (QID) | ORAL | Status: DC | PRN

## 2019-11-16 MED ORDER — INSULIN GLARGINE 100 UNIT/ML ~~LOC~~ SOLN
25.0000 [IU] | Freq: Every day | SUBCUTANEOUS | Status: DC
  Administered 2019-11-16 – 2019-11-17 (×2): 25 [IU] via SUBCUTANEOUS
  Filled 2019-11-16 (×3): qty 0.25

## 2019-11-16 MED ORDER — CINACALCET HCL 30 MG PO TABS
30.0000 mg | ORAL_TABLET | ORAL | Status: DC
  Filled 2019-11-16: qty 1

## 2019-11-16 MED ORDER — ONDANSETRON HCL 4 MG PO TABS
4.0000 mg | ORAL_TABLET | Freq: Four times a day (QID) | ORAL | Status: DC | PRN
  Filled 2019-11-16: qty 1

## 2019-11-16 MED ORDER — ASPIRIN EC 81 MG PO TBEC
81.0000 mg | DELAYED_RELEASE_TABLET | Freq: Every day | ORAL | Status: DC
  Administered 2019-11-17 – 2019-11-29 (×13): 81 mg via ORAL
  Filled 2019-11-16 (×13): qty 1

## 2019-11-16 MED ORDER — PROCHLORPERAZINE EDISYLATE 10 MG/2ML IJ SOLN
5.0000 mg | Freq: Four times a day (QID) | INTRAMUSCULAR | Status: DC | PRN
  Administered 2019-11-29: 10 mg via INTRAMUSCULAR
  Filled 2019-11-16: qty 2

## 2019-11-16 MED ORDER — TRAZODONE HCL 50 MG PO TABS: 25.0000 mg | ORAL_TABLET | Freq: Every evening | ORAL | Status: DC | PRN

## 2019-11-16 MED ORDER — MILK AND MOLASSES ENEMA
1.0000 | Freq: Once | RECTAL | Status: DC
  Filled 2019-11-16: qty 240

## 2019-11-16 MED ORDER — FENOFIBRATE 160 MG PO TABS
160.0000 mg | ORAL_TABLET | Freq: Every day | ORAL | Status: DC
  Administered 2019-11-17 – 2019-11-29 (×13): 160 mg via ORAL
  Filled 2019-11-16 (×13): qty 1

## 2019-11-16 MED ORDER — ALUMINUM HYDROXIDE GEL 320 MG/5ML PO SUSP
10.0000 mL | ORAL | Status: DC | PRN
  Filled 2019-11-16: qty 30

## 2019-11-16 MED ORDER — PROSOURCE TF PO LIQD
45.0000 mL | Freq: Two times a day (BID) | ORAL | Status: DC
  Filled 2019-11-16: qty 45

## 2019-11-16 MED ORDER — RENA-VITE PO TABS: 1.0000 | ORAL_TABLET | Freq: Every day | ORAL | Status: DC

## 2019-11-16 MED ORDER — GABAPENTIN 100 MG PO CAPS: 100.0000 mg | ORAL_CAPSULE | Freq: Three times a day (TID) | ORAL | Status: DC

## 2019-11-16 MED ORDER — GABAPENTIN 100 MG PO CAPS
200.0000 mg | ORAL_CAPSULE | Freq: Every day | ORAL | Status: DC
  Administered 2019-11-16 – 2019-11-28 (×13): 200 mg via ORAL
  Filled 2019-11-16 (×13): qty 2

## 2019-11-16 MED ORDER — LORATADINE 10 MG PO TABS
10.0000 mg | ORAL_TABLET | Freq: Every day | ORAL | Status: DC
  Administered 2019-11-17 – 2019-11-29 (×13): 10 mg via ORAL
  Filled 2019-11-16 (×13): qty 1

## 2019-11-16 MED ORDER — FUROSEMIDE 40 MG PO TABS
120.0000 mg | ORAL_TABLET | Freq: Every day | ORAL | Status: DC
  Administered 2019-11-17 – 2019-11-29 (×13): 120 mg via ORAL
  Filled 2019-11-16 (×13): qty 3

## 2019-11-16 MED ORDER — INSULIN ASPART 100 UNIT/ML ~~LOC~~ SOLN
0.0000 [IU] | Freq: Three times a day (TID) | SUBCUTANEOUS | Status: DC
  Administered 2019-11-16: 2 [IU] via SUBCUTANEOUS
  Administered 2019-11-17: 5 [IU] via SUBCUTANEOUS
  Administered 2019-11-17 – 2019-11-19 (×3): 2 [IU] via SUBCUTANEOUS
  Administered 2019-11-19 – 2019-11-20 (×2): 1 [IU] via SUBCUTANEOUS
  Administered 2019-11-21: 2 [IU] via SUBCUTANEOUS
  Administered 2019-11-22 – 2019-11-23 (×2): 1 [IU] via SUBCUTANEOUS
  Administered 2019-11-23 – 2019-11-24 (×2): 2 [IU] via SUBCUTANEOUS
  Administered 2019-11-24 – 2019-11-27 (×4): 1 [IU] via SUBCUTANEOUS

## 2019-11-16 MED ORDER — INSULIN ASPART 100 UNIT/ML ~~LOC~~ SOLN
0.0000 [IU] | Freq: Every day | SUBCUTANEOUS | Status: DC
  Administered 2019-11-28: 2 [IU] via SUBCUTANEOUS

## 2019-11-16 NOTE — Progress Notes (Signed)
Patient ID: Anthony Bullock, male   DOB: December 19, 1960, 59 y.o.   MRN: [id number] Patient is a 59 year old gentleman who was postoperative day 3 left transtibial amputation.  Plan for discharge to inpatient rehab today.  There is no drainage in the wound VAC canister this will can 10 you for 1 week and then will remove the VAC dressing and apply the stump shrinker.

## 2019-11-16 NOTE — Progress Notes (Signed)
Inpatient Rehabilitation Medication Review by a Pharmacist  A complete drug regimen review was completed for this patient to identify any potential clinically significant medication issues.  Clinically significant medication issues were identified:  yes   Type of Medication Issue Identified Description of Issue Urgent (address now) Non-Urgent (address on AM team rounds) Plan Plan Accepted by Provider? (Yes / No / Pending AM Rounds)  Drug Interaction(s) (clinically significant)       Duplicate Therapy       Allergy       No Medication Administration End Date       Incorrect Dose  Discharge instructions: insulin glargine 70 units daily at bedtime Prior to admission: insulin glargine 2-80 units slide scale During [facility] admit: insulin glargine 35 units daily, decreased to 25 units daily CIR order: insulin glargine 25 units daily  Gabapentin 600 mg BID to continue on discharge to CIR, gabapentin 100 mg bid ordered in CIR  Insulin lispro 10 units TID to restart on discharge, CIR orders for SSI and aspart 2 units TID (was 4 units TID prior to transfer) Non-urgent Appropriate given current glucose control, would not be appropriate to recommend insulin glargine 70 units daily at this time. No changes warranted.     Will secure chat MD    MD decreasing insulin due to well controlled CBGs. No changes warranted at this time. NA         Pending response    NA  Additional Drug Therapy Needed       Other  During [facility] admit was on senna-docusate, discharge instructions to resume pta docusate, CIR ordered senna-docusate.  Fexofenadine to resume on discharge to CIR, not ordered  Multivitamin to resume on discharge to CIR, not ordered Non-urgent Patient tolerating senna-docusate, no changes warranted at this time.     Will secure chat MD   Will secure chat MD NA      Pending response   Pending response    Name of provider notified for urgent issues identified:  NA Provider Method of Notification:   For non-urgent medication issues to be resolved on team rounds tomorrow morning a [facility] Secure Chat Handoff was sent to: [facility] (secure chat)   Pharmacist comments:   Time spent performing this drug regimen review (minutes):  [place], PharmD PGY-1 Pharmacy Resident 11/16/2019 4:08 PM Please see AMION for all pharmacy numbers

## 2019-11-16 NOTE — Progress Notes (Signed)
Pt admitted to room 4W04 at 1351. Oriented to floor and rehab policy. No further questions from pt or family at this time. Pt currently resting in bed with all needs within reach.   Gerald Stabs, RN

## 2019-11-16 NOTE — Discharge Summary (Signed)
Discharge Diagnoses:  Active Problems:   Dehiscence of amputation stump ([place])   Abscess of left foot   Surgeries: Procedure(s): LEFT BELOW KNEE AMPUTATION on 11/13/2019    Consultants: Treatment Team:  Roney Jaffe, MD  Discharged Condition: Improved  Hospital Course: Anthony Bullock is an 59 y.o. male who was admitted 11/13/2019 with a chief complaint of osteomyelitis left foot, with a final diagnosis of Osteomyelitis Left Foot.  Patient was brought to the operating room on 11/13/2019 and underwent Procedure(s): LEFT BELOW KNEE AMPUTATION.    Patient was given perioperative antibiotics:  Anti-infectives (From admission, onward)   Start     Dose/Rate Route Frequency Ordered Stop   11/13/19 1830  ceFAZolin (ANCEF) IVPB 1 g/50 mL premix        1 g 100 mL/hr over 30 Minutes Intravenous Every 6 hours 11/13/19 1629 11/14/19 0703   11/13/19 1016  ceFAZolin (ANCEF) 2-4 GM/100ML-% IVPB       Note to Pharmacy: Alvy Beal   : cabinet override      11/13/19 1016 11/13/19 1305   11/13/19 1015  ceFAZolin (ANCEF) IVPB 2g/100 mL premix        2 g 200 mL/hr over 30 Minutes Intravenous On call to O.R. 11/13/19 1007 11/13/19 1246    .  Patient was given sequential compression devices, early ambulation, and aspirin for DVT prophylaxis.  Recent vital signs:  Patient Vitals for the past 24 hrs:  BP Temp Temp src Pulse Resp SpO2 Weight  11/16/19 0900 (!) 105/55 -- -- 75 -- -- --  11/16/19 0830 (!) 131/57 -- -- 74 -- -- --  11/16/19 0824 (!) 122/59 -- -- 73 20 -- --  11/16/19 0818 136/60 98.2 F (36.8 C) Oral 78 18 95 % --  11/16/19 0813 -- -- -- -- -- -- 83.2 kg  11/16/19 0807 (!) 147/58 98.2 F (36.8 C) Oral 78 17 97 % --  11/16/19 0346 (!) 132/58 97.7 F (36.5 C) Oral 71 16 96 % --  11/15/19 1933 (!) 152/61 99.2 F (37.3 C) Oral 79 17 94 % --  11/15/19 1506 130/72 98.2 F (36.8 C) Oral 87 18 99 % --  .  Recent laboratory studies: No results found.  Discharge Medications:    Allergies as of 11/16/2019      Reactions   Epoetin Alfa Other (See Comments)   unknown   Ferumoxytol Other (See Comments)   unknown   Morphine Sulfate Rash, Other (See Comments)   Itches all over, red spots      Medication List    STOP taking these medications   doxycycline 100 MG tablet Commonly known as: VIBRA-TABS   nitroGLYCERIN 0.2 mg/hr patch Commonly known as: NITRODUR - Dosed in mg/24 hr     TAKE these medications   acetaminophen 325 MG tablet Commonly known as: TYLENOL Take 1-2 tablets (325-650 mg total) by mouth every 6 (six) hours as needed for mild pain (pain score 1-3 or temp > 100.5).   albuterol 108 (90 Base) MCG/ACT inhaler Commonly known as: VENTOLIN HFA Inhale 2 puffs into the lungs every 4 (four) hours as needed for wheezing or shortness of breath.   amLODipine 10 MG tablet Commonly known as: NORVASC Take 1 tablet (10 mg total) by mouth daily.   aspirin EC 81 MG tablet Take 81 mg by mouth daily.   atorvastatin 80 MG tablet Commonly known as: LIPITOR Take 1 tablet (80 mg total) by mouth at bedtime.   azelastine 0.1 %  nasal spray Commonly known as: Astelin Place 2 sprays into both nostrils 3 (three) times daily as needed for rhinitis. Use in each nostril as directed   Basaglar KwikPen 100 UNIT/ML INJECT 70 UNITS SUBCUTANEOUSLY AT BEDTIME What changed:   how much to take  how to take this  when to take this  reasons to take this  additional instructions   BD Pen Needle Nano U/F 32G X 4 MM Misc Generic drug: Insulin Pen Needle USE 1 PEN NEEDLE SUBCUTANEOUSLY WITH INSULIN 4 TIMES DAILY   carvedilol 25 MG tablet Commonly known as: COREG Take 1 tablet (25 mg total) by mouth 2 (two) times daily.   cinacalcet 30 MG tablet Commonly known as: SENSIPAR Take 1 tablet (30 mg total) by mouth every Monday, Wednesday, and Friday with hemodialysis.   docusate sodium 100 MG capsule Commonly known as: COLACE Take 1 capsule (100 mg total) by  mouth 2 (two) times daily.   doxercalciferol 4 MCG/2ML injection Commonly known as: HECTOROL Inject 1.25 mLs (2.5 mcg total) into the vein every Monday, Wednesday, and Friday with hemodialysis.   fenofibrate 160 MG tablet Take 1 tablet (160 mg total) by mouth daily.   fexofenadine 180 MG tablet Commonly known as: ALLEGRA Take 180 mg by mouth daily.   FreeStyle Libre 14 Day Sensor Misc USE AS DIRECTED EVERY 14 DAYS   furosemide 40 MG tablet Commonly known as: LASIX Take 3 tablets by mouth once daily   gabapentin 300 MG capsule Commonly known as: NEURONTIN Take 2 capsules (600 mg total) by mouth 2 (two) times daily.   glucose blood test strip Check 1 time daily. E11.9 One Touch Ultra Blue Test Strips   insulin lispro 200 UNIT/ML KwikPen Commonly known as: HumaLOG KwikPen Inject 10 Units into the skin 3 (three) times daily with meals. Use tid with meals as per sliding scale What changed:   how much to take  additional instructions   lanthanum 500 MG chewable tablet Commonly known as: FOSRENOL Chew 500-1,000 mg by mouth See admin instructions. Take 2000 mg with meals three time a day and 500 mg with snacks   multivitamin Tabs tablet Take 1 tablet by mouth daily.   Oxycodone HCl 10 MG Tabs Take 1-1.5 tablets (10-15 mg total) by mouth every 4 (four) hours as needed for severe pain (pain score 7-10).   Pataday 0.2 % Soln Generic drug: Olopatadine HCl Place 1 drop into both eyes daily as needed (for allergies).       Diagnostic Studies: VAS [name] ABI WITH/WO TBI  Result Date: 11/04/2019 LOWER EXTREMITY DOPPLER STUDY Indications: Peripheral artery disease, and right AKA and Left arm HD access.  Performing Technologist: Ralene Cork RVT  Examination Guidelines: A complete evaluation includes at minimum, Doppler waveform signals and systolic blood pressure reading at the level of bilateral brachial, anterior tibial, and posterior tibial arteries, when vessel segments are  accessible. Bilateral testing is considered an integral part of a complete examination. Photoelectric Plethysmograph (PPG) waveforms and toe systolic pressure readings are included as required and additional duplex testing as needed. Limited examinations for reoccurring indications may be performed as noted.  ABI Findings: +--------+------------------+-----+--------+--------+ Right   Rt Pressure (mmHg)IndexWaveformComment  +--------+------------------+-----+--------+--------+ [id number]                                     +--------+------------------+-----+--------+--------+ +---------+------------------+-----+----------+-------+ Left     Lt Pressure (mmHg)IndexWaveform  Comment +---------+------------------+-----+----------+-------+  PTA      167               1.03 monophasic        +---------+------------------+-----+----------+-------+ DP       255               1.57 monophasic        +---------+------------------+-----+----------+-------+ Great Toe25                0.15                   +---------+------------------+-----+----------+-------+ +-------+-----------+-----------+------------+------------+ ABI/TBIToday's ABIToday's TBIPrevious ABIPrevious TBI +-------+-----------+-----------+------------+------------+ Right  AKA                   [place]                       +-------+-----------+-----------+------------+------------+ Left   [place]         0.15       [place]                       +-------+-----------+-----------+------------+------------+  Previous ABI on 04/01/16.  Summary: Left: Resting left ankle-brachial index indicates noncompressible left lower extremity arteries. The left toe-brachial index is abnormal. LT Great toe pressure = 25 mmHg.  *See table(s) above for measurements and observations.  Electronically signed by Harold Barban MD on 11/04/2019 at 4:56:33 PM.    Final     Patient benefited maximally from their hospital stay and there were no  complications.     Disposition: Discharge disposition: 02-Transferred to [facility]      Discharge Instructions    Call MD / Call 911   Complete by: As directed    If you experience chest pain or shortness of breath, CALL 911 and be transported to the hospital emergency room.  If you develope a fever above 101 F, pus (white drainage) or increased drainage or redness at the wound, or calf pain, call your surgeon's office.   Call MD / Call 911   Complete by: As directed    If you experience chest pain or shortness of breath, CALL 911 and be transported to the hospital emergency room.  If you develope a fever above 101 F, pus (white drainage) or increased drainage or redness at the wound, or calf pain, call your surgeon's office.   Constipation Prevention   Complete by: As directed    Drink plenty of fluids.  Prune juice may be helpful.  You may use a stool softener, such as Colace (over the counter) 100 mg twice a day.  Use MiraLax (over the counter) for constipation as needed.   Constipation Prevention   Complete by: As directed    Drink plenty of fluids.  Prune juice may be helpful.  You may use a stool softener, such as Colace (over the counter) 100 mg twice a day.  Use MiraLax (over the counter) for constipation as needed.   Diet - low sodium heart healthy   Complete by: As directed    Diet - low sodium heart healthy   Complete by: As directed    Increase activity slowly as tolerated   Complete by: As directed    Increase activity slowly as tolerated   Complete by: As directed    Negative Pressure Wound Therapy - Incisional   Complete by: As directed    Show patient how to attach prevena vac  Follow-up Information    Suzan Slick, NP Follow up in 1 week(s).   Specialty: Orthopedic Surgery Contact information: [address] [place] [address] [phone]                Signed: Newt Minion 11/16/2019, 9:34 AM

## 2019-11-16 NOTE — Progress Notes (Signed)
[facility] Progress Note   Subjective: Seen on HD with Dr. Sharol Given at bedside. No C/Os. Tolerating HD without issues.   Objective Vitals:   11/16/19 0818 11/16/19 0824 11/16/19 0830 11/16/19 0900  BP: 136/60 (!) 122/59 (!) 131/57 (!) 105/55  Pulse: 78 73 74 75  Resp: 18 20    Temp: 98.2 F (36.8 C)     TempSrc: Oral     SpO2: 95%     Weight:      Height:       Physical Exam General: Pleasant, older male in NAD Heart: S1,S2 RRR No M/R/G Lungs: CTAB anteriorly Abdomen: S, NT Extremities: Bilateral BKA. L stump in protector, wound vac in place.  Dialysis Access: RIJ TDC blood lines connected. Exit site appear clean.    Additional Objective Labs: Basic Metabolic Panel: Recent Labs  Lab 11/13/19 1033 11/14/19 0651 11/15/19 0804  NA 138 136 136  K 5.0 5.6* 4.6  CL 99 100 97*  CO2  --  24 26  GLUCOSE 130* 104* 93  BUN 51* 65* 34*  CREATININE 9.90* 11.29* 8.48*  CALCIUM  --  8.6* 8.6*  PHOS  --  3.4 4.3   Liver Function Tests: Recent Labs  Lab 11/14/19 0651 11/15/19 0804  ALBUMIN 3.0* 3.0*   No results for input(s): LIPASE, AMYLASE in the last 168 hours. CBC: Recent Labs  Lab 11/14/19 0651 11/15/19 0804 11/16/19 0846  WBC 11.1* 9.0 8.9  HGB 11.1* 11.3* 10.4*  HCT 35.1* 36.3* 33.4*  MCV 90.0 91.2 93.3  PLT 148* 151 144*   Blood Culture    Component Value Date/Time   SDES BLOOD ARTERIAL LINE 09/03/2019 1642   SPECREQUEST  09/03/2019 1642    BOTTLES DRAWN AEROBIC AND ANAEROBIC Blood Culture adequate volume   CULT  09/03/2019 1642    NO GROWTH 5 DAYS Performed at [facility], [address], [place], [place] [address]    REPTSTATUS 09/08/2019 FINAL 09/03/2019 1642    Cardiac Enzymes: No results for input(s): CKTOTAL, CKMB, CKMBINDEX, TROPONINI in the last 168 hours. CBG: Recent Labs  Lab 11/15/19 0656 11/15/19 1105 11/15/19 1658 11/15/19 1932 11/16/19 0651  GLUCAP 78 93 113* 195* 98   Iron Studies: No results for input(s):  IRON, TIBC, TRANSFERRIN, FERRITIN in the last 72 hours. @lablastinr3 @ Studies/Results: No results found. Medications:  . amLODipine  10 mg Oral Daily  . aspirin EC  81 mg Oral Daily  . atorvastatin  80 mg Oral QHS  . carvedilol  25 mg Oral BID WC  . Chlorhexidine Gluconate Cloth  6 each Topical Q0600  . cinacalcet  30 mg Oral Q M,W,F-HD  . docusate sodium  100 mg Oral BID  . doxercalciferol  2.5 mcg Intravenous Q M,W,F-HD  . fenofibrate  160 mg Oral Daily  . furosemide  120 mg Oral Daily  . gabapentin  600 mg Oral BID  . insulin aspart  0-5 Units Subcutaneous QHS  . insulin aspart  0-9 Units Subcutaneous TID WC  . insulin aspart  4 Units Subcutaneous TID WC  . insulin glargine  25 Units Subcutaneous QHS  . lanthanum  2,000 mg Oral TID with meals  . lanthanum  500 mg Oral With snacks  . loratadine  10 mg Oral Daily     Dialysis Orders: [facility] TTS 3h 65min 400/600 2.2/5 bath TDC RIJ 83kg  -Hep 1000 units+ 500 units /hr - hect 2.5 ug tiw - epo 4000 units IV tiw - sensipar 30  mg po tiw   Assessment/Plan: 1. PAD: S/p L BKA on 11/3 by Dr. Sharol Given.  2. ESRD: per pt, dialyzes on TTS schedule. HD today on schedule. 3. HTN/volume:  No edema on exam. Post HD weight 82kg yesterday, EDW likely needs to be decreased about 2-2.5kg after BKA. Holding heparin tomorrow (post op) 4. Anemia: Hgb 10.4. No ESA indicated at this time.  5. Secondary hyperparathyroidism:  Calcium and phosphorus controlled. Continue hectorol, sensipar, and  binder 6. Nutrition: Albumin low. Add protein supps/renal vits. Renal/Carb mod diet.   Geena Weinhold H. Gizel Riedlinger NP-C 11/16/2019, 9:18 AM  [employer] [phone]

## 2019-11-16 NOTE — H&P (Addendum)
Physical Medicine and Rehabilitation Admission H&P    CC: Functional deficits due to BKA   HPI: Anthony Bullock is a 59 year old male with history of T2DM with nephropathy/neuropathy, ESRD- HD, NICM s/p AICD, left foot ulcer with osteomyelitis s/p toe amputation with poor wound healing and gangrenous changes. He was admitted on 11/13/19 for L-BKA by Dr. Sharol Given. Post op  HD ongoing on TTS. He has had issues with hypoglycemia and insulin regimen adjusted per recommendations by diabetes coordinater. Therapy ongoing and patient limited by balance deficits with posterior lean with standing as well as issues with endurance affecting ADLs and mobility. CIR recommended due to functional decline.    Review of Systems  Constitutional: Negative for chills and fever.  HENT: Negative for hearing loss and tinnitus.   Eyes: Negative for blurred vision and double vision.  Respiratory: Negative for cough and shortness of breath.   Cardiovascular: Negative for chest pain and palpitations.  Gastrointestinal: Positive for constipation and nausea. Negative for heartburn.  Musculoskeletal: Negative for myalgias and neck pain.  Skin: Negative for itching and rash.  Neurological: Positive for weakness. Negative for dizziness, sensory change, speech change and headaches.  Psychiatric/Behavioral: The patient has insomnia (gets up every 4 hours due to pain).      Past Medical History:  Diagnosis Date  . 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 Allergic rhinitis   . Anemia   . Arthritis   . Chronic systolic heart failure ([place])    a. ECHO (12/2012) EF 25-30%, HK entireanteroseptal myocardium //  b.  EF 25%, diffuse HK, grade 1 diastolic dysfunction, MAC, mild LAE, normal RVSF, trivial pericardial effusion  . COPD (chronic obstructive pulmonary disease) ([place])   . Diabetes mellitus type II   . Diabetic nephropathy ([place])   . Diabetic neuropathy ([place])   . ESRD on hemodialysis  [facility])    started HD June 2017, goes to [facility] HD unit, Dr Hinda Lenis  . History of cardiac catheterization    a.Myoview 1/15:  There is significant left ventricular dysfunction. There may be slight scar at the apex. There is no significant ischemia. LV Ejection Fraction: 27%  //  b. RHC/LHC (1/15) with mean RA 6, PA 47/22 mean 33, mean PCWP 20, PVR 2.5 WU, CI 2.5; 80% dLAD stenosis, 70% diffuse large D.   . History of kidney stones   . Hyperlipidemia   . Hypertension   . Kidney stones   . NICM (nonischemic cardiomyopathy) ([place])    Primarily nonischemic. Echo (12/14) with EF 25-30%. Echo (3/15) with EF 25%, mild to moderately dilated LV, normal RV size and systolic function.   . Osteomyelitis ([place])    left fifth ray  . Pneumonia   . Urethral stricture   . Wears glasses     Past Surgical History:  Procedure Laterality Date  . ABDOMINAL AORTOGRAM W/LOWER EXTREMITY N/A 03/30/2016   Procedure: Abdominal Aortogram w/Lower Extremity;  Surgeon: Angelia Mould, MD;  Location: [place] CV [facility];  Service: Cardiovascular;  Laterality: N/A;  . AMPUTATION Right 04/26/2016   Procedure: Right Below Knee Amputation;  Surgeon: Newt Minion, MD;  Location: [place];  Service: Orthopedics;  Laterality: Right;  . AMPUTATION Left 08/21/2019   Procedure: LEFT FOOT 5TH RAY AMPUTATION;  Surgeon: Newt Minion, MD;  Location: [place];  Service: Orthopedics;  Laterality: Left;  . AMPUTATION Left 11/13/2019   Procedure: LEFT BELOW KNEE AMPUTATION;  Surgeon: Newt Minion,  MD;  Location: [place];  Service: Orthopedics;  Laterality: Left;  . AV FISTULA PLACEMENT Right 09/08/2015   Procedure: INSERTION OF 4-15m x 45cm  ARTERIOVENOUS (AV) GORE-TEX GRAFT RIGHT UPPER  ARM;  Surgeon: CAngelia Mould MD;  [place]  Service: Vascular;  Laterality: Right;  . AV FISTULA PLACEMENT Left 01/14/2016   Procedure: CREATION OF LEFT UPPER ARM ARTERIOVENOUS FISTULA;  Surgeon: CAngelia Mould MD;  [place]  Service: Vascular;  Laterality: Left;  . BASCILIC VEIN TRANSPOSITION Right 08/22/2014   Procedure: RIGHT UPPER ARM BASCILIC VEIN TRANSPOSITION;  Surgeon: CAngelia Mould MD;  [place]  Service: Vascular;  Laterality: Right;  . BELOW KNEE LEG AMPUTATION Right 04/26/2016  . CARDIAC CATHETERIZATION    . CARDIAC DEFIBRILLATOR PLACEMENT  06/27/2013   Sub Q       BY DR KCaryl Comes . CATARACT EXTRACTION W/PHACO Right 08/06/2018   Procedure: CATARACT EXTRACTION PHACO AND INTRAOCULAR LENS PLACEMENT (IOC);  Surgeon: WBaruch Goldmann MD;  Location: [facility];  Service: Ophthalmology;  Laterality: Right;  CDE: 4.06  . CATARACT EXTRACTION W/PHACO Left 08/20/2018   Procedure: CATARACT EXTRACTION PHACO AND INTRAOCULAR LENS PLACEMENT (IOC);  Surgeon: WBaruch Goldmann MD;  Location: [facility];  Service: Ophthalmology;  Laterality: Left;  CDE: 6.76  . COLONOSCOPY WITH PROPOFOL N/A 07/22/2015   Procedure: COLONOSCOPY WITH PROPOFOL;  Surgeon: HDoran Stabler MD;  [facility];  Service: Gastroenterology;  Laterality: N/A;  . FEMORAL-POPLITEAL BYPASS GRAFT Right 03/31/2016   Procedure: BYPASS GRAFT FEMORAL-POPLITEAL ARTERY USING RIGHT GREATER SAPHENOUS NONREVERSED VEIN;  Surgeon: CAngelia Mould MD;  [place]  Service: Vascular;  Laterality: Right;  . HERNIA REPAIR    . I & D EXTREMITY Right 03/31/2016   Procedure: IRRIGATION AND DEBRIDEMENT FOOT;  Surgeon: CAngelia Mould MD;  [place]  Service: Vascular;  Laterality: Right;  . IMPLANTABLE CARDIOVERTER DEFIBRILLATOR IMPLANT N/A 06/27/2013   Procedure: SUB Q ICD;  Surgeon: SDeboraha Sprang MD;  [facility];  Service: Cardiovascular;  Laterality: N/A;  . INTRAOPERATIVE ARTERIOGRAM Right 03/31/2016   Procedure: INTRA OPERATIVE ARTERIOGRAM;  Surgeon: CAngelia Mould MD;  [place]  Service: Vascular;  Laterality: Right;  . IR GENERIC HISTORICAL Right 11/30/2015   IR THROMBECTOMY AV FISTULA  W/THROMBOLYSIS/PTA INC/SHUNT/IMG RIGHT 11/30/2015 GAletta Edouard MD [facility]  . IR GENERIC HISTORICAL  11/30/2015   IR [place] ACCESS RIGHT 11/30/2015 GAletta Edouard MD [facility]  . IR GENERIC HISTORICAL Right 12/15/2015   IR THROMBECTOMY AV FISTULA W/THROMBOLYSIS/PTA/STENT INC/SHUNT/IMG RT 12/15/2015 DArne Cleveland MD [facility]  . IR GENERIC HISTORICAL  12/15/2015   IR [place] V[place] ACCESS RIGHT 12/15/2015 DArne Cleveland MD [facility]  . IR GENERIC HISTORICAL  12/28/2015   IR FLUORO GUIDE CV LINE RIGHT 12/28/2015 AMarybelle Killings MD [facility]  . IR GENERIC HISTORICAL  12/28/2015   IR [place] V[place] ACCESS RIGHT 12/28/2015 AMarybelle Killings MD [facility]  . LEFT A ND RIGHT HEART CATH  01/30/2013   DR BSung Amabile . LEFT AND RIGHT HEART CATHETERIZATION WITH CORONARY ANGIOGRAM N/A 01/30/2013   Procedure: LEFT AND RIGHT HEART CATHETERIZATION WITH CORONARY ANGIOGRAM;  Surgeon: DJolaine Artist MD;  [facility];  Service: Cardiovascular;  Laterality: N/A;  . PERIPHERAL VASCULAR CATHETERIZATION Right 01/26/2015   Procedure: A/V Fistulagram;  Surgeon: CAngelia Mould MD;  [facility];  Service: Cardiovascular;  Laterality: Right;  .  reapea urethral surgery for recurrent obstruction  2011  . TOTAL KNEE ARTHROPLASTY Right 2007  . VEIN HARVEST Right 03/31/2016   Procedure: RIGHT GREATER SAPHENOUS VEIN HARVEST;  Surgeon: Angelia Mould, MD;  [facility];  Service: Vascular;  Laterality: Right;    Family History  Problem Relation Age of Onset  . Bladder Cancer Mother   . Alcohol abuse Father   . Melanoma Father   . Stroke Maternal Grandmother   . Heart Problems Maternal Grandmother        unknown  . Diabetes Maternal Grandmother   . Heart disease Maternal Grandfather   . Prostate cancer Maternal Grandfather     Social History:  Married. Used to work for [employer]--disabled. He reports that he quit smoking about 9 years ago. His smoking use  included cigarettes. He has a 64.00 pack-year smoking history. He has never used smokeless tobacco. He reports that he does not drink alcohol and does not use drugs.   Allergies  Allergen Reactions  . Epoetin Alfa Other (See Comments)    unknown  . Ferumoxytol Other (See Comments)    unknown  . Morphine Sulfate Rash and Other (See Comments)    Itches all over, red spots    Medications Prior to Admission  Medication Sig Dispense Refill  . amLODipine (NORVASC) 10 MG tablet Take 1 tablet (10 mg total) by mouth daily. 90 tablet 2  . aspirin EC 81 MG tablet Take 81 mg by mouth daily.     [name] atorvastatin (LIPITOR) 80 MG tablet Take 1 tablet (80 mg total) by mouth at bedtime. 90 tablet 3  . carvedilol (COREG) 25 MG tablet Take 1 tablet (25 mg total) by mouth 2 (two) times daily. 180 tablet 2  . doxycycline (VIBRA-TABS) 100 MG tablet Take 1 tablet (100 mg total) by mouth 2 (two) times daily. 28 tablet 0  . fenofibrate 160 MG tablet Take 1 tablet (160 mg total) by mouth daily. 90 tablet 3  . fexofenadine (ALLEGRA) 180 MG tablet Take 180 mg by mouth daily.    . furosemide (LASIX) 40 MG tablet Take 3 tablets by mouth once daily (Patient taking differently: Take 120 mg by mouth daily. ) 270 tablet 0  . gabapentin (NEURONTIN) 300 MG capsule Take 2 capsules (600 mg total) by mouth 2 (two) times daily. 360 capsule 3  . Insulin Glargine (BASAGLAR KWIKPEN) 100 UNIT/ML INJECT 70 UNITS SUBCUTANEOUSLY AT BEDTIME (Patient taking differently: Inject 2-80 Units into the skin at bedtime as needed (low blood glucose). Sliding scale) 15 mL 11  . Insulin Lispro (HUMALOG KWIKPEN) 200 UNIT/ML SOPN Inject 10 Units into the skin 3 (three) times daily with meals. Use tid with meals as per sliding scale (Patient taking differently: Inject 2-14 Units into the skin 3 (three) times daily with meals. sliding scale) 4 pen 6  . lanthanum (FOSRENOL) 500 MG chewable tablet Chew 500-1,000 mg by mouth See admin instructions. Take  2000 mg with meals three time a day and 500 mg with snacks    . multivitamin (RENA-VIT) TABS tablet Take 1 tablet by mouth daily. 90 tablet 3  . albuterol (VENTOLIN HFA) 108 (90 Base) MCG/ACT inhaler Inhale 2 puffs into the lungs every 4 (four) hours as needed for wheezing or shortness of breath. 1 each 1  . azelastine (ASTELIN) 0.1 % nasal spray Place 2 sprays into both nostrils 3 (three) times daily as needed for rhinitis. Use in each nostril as directed 30 mL 12  .  Continuous Blood Gluc Sensor (FREESTYLE LIBRE 14 DAY SENSOR) MISC USE AS DIRECTED EVERY 14 DAYS 4 each 11  . glucose blood test strip Check 1 time daily. E11.9 One Touch Ultra Blue Test Strips 100 each 3  . Insulin Pen Needle (BD PEN NEEDLE NANO U/F) 32G X 4 MM MISC USE 1 PEN NEEDLE SUBCUTANEOUSLY WITH INSULIN 4 TIMES DAILY 400 each 0  . nitroGLYCERIN (NITRODUR - DOSED IN MG/24 HR) 0.2 mg/hr patch Place 1 patch (0.2 mg total) onto the skin daily. (Patient not taking: Reported on 11/08/2019) 30 patch 4  . Olopatadine HCl (PATADAY) 0.2 % SOLN Place 1 drop into both eyes daily as needed (for allergies).       Drug Regimen Review  Drug regimen was reviewed and remains appropriate with no significant issues identified  Home: Home Living Family/patient expects to be discharged to:: Private residence Living Arrangements: Spouse/significant other Available Help at Discharge: Family, Available PRN/intermittently Type of Home: House Home Access: Ramped entrance (steep ramp) Home Layout: One level Bathroom Toilet: Handicapped height (BSC over toilet) Bathroom Accessibility: No Home Equipment: Walker - 2 wheels, Wheelchair - manual, Bedside commode, Other (comment) (sliding board) Additional Comments: Wheelchair will not fit in bathroom/bedroom doorways. Wife works full time, but reports able to work from home as needed. Have some church members that can assist as needed   Functional History: Prior Function Level of Independence: Needs  assistance Gait / Transfers Assistance Needed: used wheelchair/RW to get around in the house. Wife pushed pt in wheelchair in/out of the home ADL's / Homemaking Assistance Needed: Wife assisted with sponge bathing, toilet transfers Comments: Prior to recent foot amputation, pt Independent in all daily tasks, was walking with R prosthetic LE and no AD. Has required increased assistance for ADLs/transfers since foot amputations  Functional Status:  Mobility: Bed Mobility Overal bed mobility: Needs Assistance Bed Mobility: Supine to Sit Supine to sit: Mod assist, HOB elevated Sit to supine: Mod assist General bed mobility comments: Mod A for trunk advancement and scooting R hip forward Transfers Overall transfer level: Needs assistance Equipment used: Rolling walker (2 wheeled), None Transfers: Sit to/from Stand, Radiographer, therapeutic Sit to Stand: Max assist, +2 physical assistance, +2 safety/equipment Anterior-Posterior transfers: Min assist General transfer comment: Initially attempted sit to stand transfers in prep for pivot to chair with RW and R prosthetic. Pt Max A x 2 for sit to stand, Max A to maintain standing balance with posterior lean. Opted for AP transfer from bed <> recliner with Min A at most and cueing for sequencing  ADL: ADL Overall ADL's : Needs assistance/impaired Eating/Feeding: Set up, Sitting Grooming: Sitting, Min guard Upper Body Bathing: Minimal assistance, Sitting Lower Body Bathing: Maximal assistance, Sitting/lateral leans Upper Body Dressing : Minimal assistance, Sitting Upper Body Dressing Details (indicate cue type and reason): Min A to doff/don new gown Lower Body Dressing: Moderate assistance, Sitting/lateral leans Lower Body Dressing Details (indicate cue type and reason): Pt overall min guard for donning R prosthetic sitting EOB to maintain balance. Pt will require increased assist for donning pants/underwear but will do better with lateral  leans. Standing for ADLs unsafe at this time Toileting- Clothing Manipulation and Hygiene: Maximal assistance, Sitting/lateral lean, Bed level General ADL Comments: Improved pain control, still limited by weakness and decreased balance  Cognition: Cognition Overall Cognitive Status: Within Functional Limits for tasks assessed Orientation Level: Oriented X4 Cognition Arousal/Alertness: Awake/alert Behavior During Therapy: WFL for tasks assessed/performed Overall Cognitive Status: Within Functional Limits for tasks  assessed General Comments: pleasant and cooperative  Physical Exam: There were no vitals taken for this visit.  General: Alert and oriented x 3, No apparent distress HEENT: Head is normocephalic, atraumatic, PERRLA, EOMI, sclera anicteric, oral mucosa pink and moist, dentition intact, ext ear canals clear,  Neck: Supple without JVD or lymphadenopathy Heart: Reg rate and rhythm. No murmurs rubs or gallops Chest: CTA bilaterally without wheezes, rales, or rhonchi; no distress Abdomen: Soft, non-tender, non-distended, bowel sounds positive. Extremities: No clubbing, cyanosis, or edema. Pulses are 2+ Skin: Stage 2 pressure injury to bilateral buttocks and sacrum Neuro: Pt is cognitively appropriate with normal insight, memory, and awareness. Cranial nerves 2-12 are intact. Sensory exam is normal. Reflexes are 2+ in all 4's. Fine motor coordination is intact. No tremors. Motor function is grossly 5/5.  Musculoskeletal: Right BKA site well healed.  L-BKAn with limb guard over wound VAC.   Psych: Pt's affect is appropriate. Pt is cooperative  Results for orders placed or performed during the hospital encounter of 11/13/19 (from the past 48 hour(s))  Glucose, capillary     Status: Abnormal   Collection Time: 11/14/19  4:58 PM  Result Value Ref Range   Glucose-Capillary 127 (H) 70 - 99 mg/dL    Comment: Glucose reference range applies only to samples taken after fasting for at  least 8 hours.  Glucose, capillary     Status: Abnormal   Collection Time: 11/14/19  7:35 PM  Result Value Ref Range   Glucose-Capillary 141 (H) 70 - 99 mg/dL    Comment: Glucose reference range applies only to samples taken after fasting for at least 8 hours.  Glucose, capillary     Status: None   Collection Time: 11/15/19  6:56 AM  Result Value Ref Range   Glucose-Capillary 78 70 - 99 mg/dL    Comment: Glucose reference range applies only to samples taken after fasting for at least 8 hours.  CBC     Status: Abnormal   Collection Time: 11/15/19  8:04 AM  Result Value Ref Range   WBC 9.0 4.0 - 10.5 K/uL   RBC 3.98 (L) 4.22 - 5.81 MIL/uL   Hemoglobin 11.3 (L) 13.0 - 17.0 g/dL   HCT 36.3 (L) 39 - 52 %   MCV 91.2 80.0 - 100.0 fL   MCH 28.4 26.0 - 34.0 pg   MCHC 31.1 30.0 - 36.0 g/dL   RDW 17.3 (H) 11.5 - 15.5 %   Platelets 151 150 - 400 K/uL   nRBC 0.0 0.0 - 0.2 %    Comment: Performed at [facility] [address], [place], [place] [address]  Renal function panel     Status: Abnormal   Collection Time: 11/15/19  8:04 AM  Result Value Ref Range   Sodium 136 135 - 145 mmol/L   Potassium 4.6 3.5 - 5.1 mmol/L    Comment: NO VISIBLE HEMOLYSIS   Chloride 97 (L) 98 - 111 mmol/L   CO2 26 22 - 32 mmol/L   Glucose, Bld 93 70 - 99 mg/dL    Comment: Glucose reference range applies only to samples taken after fasting for at least 8 hours.   BUN 34 (H) 6 - 20 mg/dL   Creatinine, Ser 8.48 (H) 0.61 - 1.24 mg/dL   Calcium 8.6 (L) 8.9 - 10.3 mg/dL   Phosphorus 4.3 2.5 - 4.6 mg/dL   Albumin 3.0 (L) 3.5 - 5.0 g/dL   GFR, Estimated 7 (L) >60 mL/min  Comment: (NOTE) Calculated using the CKD-EPI Creatinine Equation (2021)    Anion gap 13 5 - 15    Comment: Performed at [facility], [place] [address], [place], [place] [address]  Glucose, capillary     Status: None   Collection Time: 11/15/19 11:05 AM  Result Value Ref Range   Glucose-Capillary 93 70 - 99 mg/dL    Comment:  Glucose reference range applies only to samples taken after fasting for at least 8 hours.  Glucose, capillary     Status: Abnormal   Collection Time: 11/15/19  4:58 PM  Result Value Ref Range   Glucose-Capillary 113 (H) 70 - 99 mg/dL    Comment: Glucose reference range applies only to samples taken after fasting for at least 8 hours.  Glucose, capillary     Status: Abnormal   Collection Time: 11/15/19  7:32 PM  Result Value Ref Range   Glucose-Capillary 195 (H) 70 - 99 mg/dL    Comment: Glucose reference range applies only to samples taken after fasting for at least 8 hours.  Glucose, capillary     Status: None   Collection Time: 11/16/19  6:51 AM  Result Value Ref Range   Glucose-Capillary 98 70 - 99 mg/dL    Comment: Glucose reference range applies only to samples taken after fasting for at least 8 hours.  CBC     Status: Abnormal   Collection Time: 11/16/19  8:46 AM  Result Value Ref Range   WBC 8.9 4.0 - 10.5 K/uL   RBC 3.58 (L) 4.22 - 5.81 MIL/uL   Hemoglobin 10.4 (L) 13.0 - 17.0 g/dL   HCT 33.4 (L) 39 - 52 %   MCV 93.3 80.0 - 100.0 fL   MCH 29.1 26.0 - 34.0 pg   MCHC 31.1 30.0 - 36.0 g/dL   RDW 17.0 (H) 11.5 - 15.5 %   Platelets 144 (L) 150 - 400 K/uL   nRBC 0.0 0.0 - 0.2 %    Comment: Performed at [facility] [address], [place], [place] [address]  Renal function panel     Status: Abnormal   Collection Time: 11/16/19  8:47 AM  Result Value Ref Range   Sodium 137 135 - 145 mmol/L   Potassium 5.4 (H) 3.5 - 5.1 mmol/L   Chloride 98 98 - 111 mmol/L   CO2 27 22 - 32 mmol/L   Glucose, Bld 116 (H) 70 - 99 mg/dL    Comment: Glucose reference range applies only to samples taken after fasting for at least 8 hours.   BUN 54 (H) 6 - 20 mg/dL   Creatinine, Ser 11.85 (H) 0.61 - 1.24 mg/dL   Calcium 8.8 (L) 8.9 - 10.3 mg/dL   Phosphorus 5.2 (H) 2.5 - 4.6 mg/dL   Albumin 2.7 (L) 3.5 - 5.0 g/dL   GFR, Estimated 4 (L) >60 mL/min    Comment: (NOTE) Calculated using the  CKD-EPI Creatinine Equation (2021)    Anion gap 12 5 - 15    Comment: Performed at [facility] [address], [place], [place] [address]  Glucose, capillary     Status: None   Collection Time: 11/16/19 12:59 PM  Result Value Ref Range   Glucose-Capillary 99 70 - 99 mg/dL    Comment: Glucose reference range applies only to samples taken after fasting for at least 8 hours.   No results found.     Medical Problem List and Plan: 1.  Impaired mobility  and ADLs secondary to left BKA  -patient may not shower  -ELOS/Goals: modI 12-16 days 2.  Antithrombotics: -DVT/anticoagulation:  Pharmaceutical: Heparin  -antiplatelet therapy: ASA 81 mg daily 3. Pain Management:  Oxycodone prn not effective. Still taking IV dilaudid   --will decrease gabapentin to 100 mg tid (renal dose) 4. Mood: LCSW to follow for evaluation and support.   -antipsychotic agents: N/A 5. Neuropsych: This patient is capable of making decisions on his own behalf. 6. Skin/Wound Care: Routine pressure relief measures.   --Wound VAC to stay in place till 11/10 7. Fluids/Electrolytes/Nutrition: Strict I/O.  -- Renal/CM diet with 1200 cc/FR 8. HTN : Monitor BP tid--continue Amlodipine, coreg and Furosemide 9. ESRD: HD - TTS with nephrology following for assist.   --Schedule HD at the end of the day to help with tolerance of therapy.  10. T2DM with neuropathy/nephropathy: Hgb A1c-6.5 and well controlled.  Reports was on Basaglar 12 units at bedtime with meal coverage/SSI.     --Lantus decreased to 25 units on 11/05. May need to decrease further as activity increases. CBGs range from 98-195 currently.  11.NICM s/p AICD: Monitor for symptoms with increase in mobility.  Continue Lipitor, coreg, ASA, fenofibrate and Lasix  12. Nausea: Has been intermittent. Question due to constipation--will change colace to Senna S and add pepcid for dyspepsia.  13. Overweight (BMI 25.46): Dietary consult ordered for education.    Reesa Chew, PA-C  I have personally performed a face to face diagnostic evaluation, including, but not limited to relevant history and physical exam findings, of this patient and developed relevant assessment and plan.  Additionally, I have reviewed and concur with the physician assistant's documentation above.  Izora Ribas, MD 11/16/2019

## 2019-11-17 ENCOUNTER — Inpatient Hospital Stay (HOSPITAL_COMMUNITY): Payer: Medicare Other

## 2019-11-17 ENCOUNTER — Inpatient Hospital Stay (HOSPITAL_COMMUNITY): Payer: Medicare Other | Admitting: Occupational Therapy

## 2019-11-17 LAB — GLUCOSE, CAPILLARY
Glucose-Capillary: 139 mg/dL — ABNORMAL HIGH (ref 70–99)
Glucose-Capillary: 84 mg/dL (ref 70–99)
Glucose-Capillary: 286 mg/dL — ABNORMAL HIGH (ref 70–99)
Glucose-Capillary: 98 mg/dL (ref 70–99)
Glucose-Capillary: 114 mg/dL — ABNORMAL HIGH (ref 70–99)

## 2019-11-17 NOTE — Progress Notes (Signed)
[facility] Progress Note   Subjective: C/O pain on top and sides of L knee. Relieved by ice and pulling back sock. Otherwise, denies SOB. Tolerated HD without issues yesterday.      Objective Vitals:   11/16/19 1559 11/16/19 1927 11/17/19 0500 11/17/19 0520  BP: (!) 129/56 (!) 134/52  (!) 109/54  Pulse: 77 79  64  Resp: 18 14  16   Temp: 99.8 F (37.7 C) 99 F (37.2 C)  98.1 F (36.7 C)  TempSrc: Oral     SpO2: 97% 99%  100%  Weight:   80.1 kg   Height:       Physical Exam General: Pleasant, older male in NAD Heart: S1,S2 RRR No M/R/G Lungs: CTAB anteriorly Abdomen: S, NT Extremities: Bilateral BKA. L stump in protector, wound vac in place. L knee tender to palpation. No R stump edema.  Dialysis Access: [facility].  Exit site appears clean. Multiple failed AV accesses.     Additional Objective Labs: Basic Metabolic Panel: Recent Labs  Lab 11/14/19 0651 11/15/19 0804 11/16/19 0847  NA 136 136 137  K 5.6* 4.6 5.4*  CL 100 97* 98  CO2 24 26 27   GLUCOSE 104* 93 116*  BUN 65* 34* 54*  CREATININE 11.29* 8.48* 11.85*  CALCIUM 8.6* 8.6* 8.8*  PHOS 3.4 4.3 5.2*   Liver Function Tests: Recent Labs  Lab 11/14/19 0651 11/15/19 0804 11/16/19 0847  ALBUMIN 3.0* 3.0* 2.7*   No results for input(s): LIPASE, AMYLASE in the last 168 hours. CBC: Recent Labs  Lab 11/14/19 0651 11/15/19 0804 11/16/19 0846  WBC 11.1* 9.0 8.9  HGB 11.1* 11.3* 10.4*  HCT 35.1* 36.3* 33.4*  MCV 90.0 91.2 93.3  PLT 148* 151 144*   Blood Culture    Component Value Date/Time   SDES BLOOD ARTERIAL LINE 09/03/2019 1642   SPECREQUEST  09/03/2019 1642    BOTTLES DRAWN AEROBIC AND ANAEROBIC Blood Culture adequate volume   CULT  09/03/2019 1642    NO GROWTH 5 DAYS Performed at [facility], [address] [place], [place] [address]    REPTSTATUS 09/08/2019 FINAL 09/03/2019 1642    Cardiac Enzymes: No results for input(s): CKTOTAL, CKMB, CKMBINDEX, TROPONINI in  the last 168 hours. CBG: Recent Labs  Lab 11/16/19 0651 11/16/19 1259 11/16/19 1642 11/16/19 2101 11/17/19 0616  GLUCAP 98 99 167* 135* 139*   Iron Studies: No results for input(s): IRON, TIBC, TRANSFERRIN, FERRITIN in the last 72 hours. @lablastinr3 @ Studies/Results: No results found. Medications:  . amLODipine  10 mg Oral Daily  . aspirin EC  81 mg Oral Daily  . atorvastatin  80 mg Oral QHS  . carvedilol  25 mg Oral BID WC  . Chlorhexidine Gluconate Cloth  6 each Topical Q0600  . [START ON 11/18/2019] cinacalcet  30 mg Oral Q M,W,F-HD  . [START ON 11/18/2019] doxercalciferol  2.5 mcg Intravenous Q M,W,F-HD  . famotidine  10 mg Oral QHS  . fenofibrate  160 mg Oral Daily  . furosemide  120 mg Oral Daily  . gabapentin  100 mg Oral Daily  . gabapentin  200 mg Oral QHS  . heparin  5,000 Units Subcutaneous Q8H  . insulin aspart  0-5 Units Subcutaneous QHS  . insulin aspart  0-9 Units Subcutaneous TID WC  . insulin aspart  2 Units Subcutaneous TID WC  . insulin glargine  25 Units Subcutaneous QHS  . lanthanum  2,000 mg Oral TID with meals  . lanthanum  500 mg Oral With snacks  . loratadine  10 mg Oral Daily  . multivitamin  1 tablet Oral QHS  . senna-docusate  2 tablet Oral QPC supper     [facility] 400/600 2.2/5 bath [facility]DC [facility] 83kg  -Hep 1000 units+ 500 units /hr - hect 2.5 ug tiw - epo 4000 units IV tiw - sensipar 30 mg po tiw  Assessment/Plan: 1.PAD:S/p L BKA on 11/3 by Dr. Sharol Given. Pain in L knee. Moved compression sock back. Will notify rehab MD.  2. ESRD:per pt, dialyzes on TTS schedule. Next HD 11/19/2019. No heparin.  3.HTN/volume:No edema on exam. BP well controlled. HD 11/06 net UF 2.5 Liters. Post wt 79.5. Lower EDW on discharge.  4. Anemia:Hgb 10.4 11/07.  No ESA indicated at this time. 5. Secondary hyperparathyroidism:Calcium and phosphorus controlled. Continue hectorol, sensipar, and binder 6.  Nutrition:Albumin low. Added protein supps/renal vits. Renal/Carb mod diet  Jesseca Marsch H. Lyall Faciane NP-C 11/17/2019, 9:07 AM  [employer] [phone]

## 2019-11-17 NOTE — Progress Notes (Addendum)
[place] PHYSICAL MEDICINE & REHABILITATION PROGRESS NOTE   Subjective/Complaints: C/o L knee pain relieved by ice.  Tolerated HD well yesterday, appreciate nephrology following.   ROS: +pain  Objective:   No results found. Recent Labs    11/15/19 0804 11/16/19 0846  WBC 9.0 8.9  HGB 11.3* 10.4*  HCT 36.3* 33.4*  PLT 151 144*   Recent Labs    11/15/19 0804 11/16/19 0847  NA 136 137  K 4.6 5.4*  CL 97* 98  CO2 26 27  GLUCOSE 93 116*  BUN 34* 54*  CREATININE 8.48* 11.85*  CALCIUM 8.6* 8.8*    Intake/Output Summary (Last 24 hours) at 11/17/2019 1032 Last data filed at 11/17/2019 0737 Gross per 24 hour  Intake 710 ml  Output 0 ml  Net 710 ml     Pressure Injury 09/03/19 Sacrum Medial Stage 2 -  Partial thickness loss of dermis presenting as a shallow open injury with a red, pink wound bed without slough. (Active)  09/03/19 1400  Location: Sacrum  Location Orientation: Medial  Staging: Stage 2 -  Partial thickness loss of dermis presenting as a shallow open injury with a red, pink wound bed without slough.  Wound Description (Comments):   Present on Admission: Yes     Pressure Injury 11/16/19 Buttocks Left Stage 2 -  Partial thickness loss of dermis presenting as a shallow open injury with a red, pink wound bed without slough. dry shiny nonblanchable area what appears to be a healing stage 2 (Active)  11/16/19 1435  Location: Buttocks  Location Orientation: Left  Staging: Stage 2 -  Partial thickness loss of dermis presenting as a shallow open injury with a red, pink wound bed without slough.  Wound Description (Comments): dry shiny nonblanchable area what appears to be a healing stage 2  Present on Admission: Yes     Pressure Injury 11/16/19 Buttocks Right Stage 2 -  Partial thickness loss of dermis presenting as a shallow open injury with a red, pink wound bed without slough. nonblanchable area what appears to be a healing stage 2 with dry flacky skin covering  (Active)  11/16/19 1435  Location: Buttocks  Location Orientation: Right  Staging: Stage 2 -  Partial thickness loss of dermis presenting as a shallow open injury with a red, pink wound bed without slough.  Wound Description (Comments): nonblanchable area what appears to be a healing stage 2 with dry flacky skin covering  Present on Admission: Yes    Physical Exam: Vital Signs Blood pressure (!) 109/54, pulse 64, temperature 98.1 F (36.7 C), resp. rate 16, height 4' 9.09" (1.45 m), weight 80.1 kg, SpO2 100 %. General: Alert and oriented x 3, No apparent distress HEENT: Head is normocephalic, atraumatic, PERRLA, EOMI, sclera anicteric, oral mucosa pink and moist, dentition intact, ext ear canals clear,  Neck: Supple without JVD or lymphadenopathy Heart: Reg rate and rhythm. No murmurs rubs or gallops Chest: CTA bilaterally without wheezes, rales, or rhonchi; no distress Abdomen: Soft, non-tender, non-distended, bowel sounds positive. Extremities: No clubbing, cyanosis, or edema. Pulses are 2+ Skin: Stage 2 pressure injury to bilateral buttocks and sacrum.  Neuro: Pt is cognitively appropriate with normal insight, memory, and awareness. Cranial nerves 2-12 are intact. Sensory exam is normal. Reflexes are 2+ in all 4's. Fine motor coordination is intact. No tremors. Motor function is grossly 5/5.  Musculoskeletal: Right BKA site well healed.  L-BKAn with limb guard over wound VAC.   Psych: Pt's affect is appropriate.  Pt is cooperative  Assessment/Plan: 1. Functional deficits secondary to left BKA which require 3+ hours per day of interdisciplinary therapy in a comprehensive inpatient rehab setting.  Physiatrist is providing close team supervision and 24 hour management of active medical problems listed below.  Physiatrist and rehab team continue to assess barriers to discharge/monitor patient progress toward functional and medical goals  Care Tool:  Bathing              Bathing  assist       Upper Body Dressing/Undressing Upper body dressing        Upper body assist      Lower Body Dressing/Undressing Lower body dressing            Lower body assist       Toileting Toileting    Toileting assist Assist for toileting: Moderate Assistance - Patient 50 - 74%     Transfers Chair/bed transfer  Transfers assist  Chair/bed transfer activity did not occur: Safety/medical concerns        Locomotion Ambulation   Ambulation assist              Walk 10 feet activity   Assist           Walk 50 feet activity   Assist           Walk 150 feet activity   Assist           Walk 10 feet on uneven surface  activity   Assist           Wheelchair     Assist               Wheelchair 50 feet with 2 turns activity    Assist            Wheelchair 150 feet activity     Assist          Blood pressure (!) 109/54, pulse 64, temperature 98.1 F (36.7 C), resp. rate 16, height 4' 9.09" (1.45 m), weight 80.1 kg, SpO2 100 %.   Medical Problem List and Plan: 1.  Impaired mobility and ADLs secondary to left BKA             -patient may not shower             -ELOS/Goals: modI 12-16 days  -Initial CIR evaluations today.  2.  Antithrombotics: -DVT/anticoagulation:  Pharmaceutical: Heparin             -antiplatelet therapy: ASA 81 mg daily 3. Pain Management:  Oxycodone prn managing pain well.              --changed Gabapentin to 100mg  daily and 200mg  HS to better help with sleep at night, patient agreeable 4. Mood: LCSW to follow for evaluation and support.              -antipsychotic agents: N/A 5. Neuropsych: This patient is capable of making decisions on his own behalf. 6. Skin/Wound Care: Routine pressure relief measures.              --Wound VAC to stay in place till 11/10  -Stage 2 pressure injury to bilateral buttocks and sacrum- monitor daily and offload q2H.  7.  Fluids/Electrolytes/Nutrition: Strict I/O.             -- Renal/CM diet with 1200 cc/FR 8. HTN : Monitor BP tid--continue Amlodipine, coreg and Furosemide 9. ESRD: HD - TTS with nephrology following for assist.              --  Schedule HD at the end of the day to help with tolerance of therapy.   -Appreciate nephrology following.  10. T2DM with neuropathy/nephropathy: Hgb A1c-6.5 and well controlled.  Reports was on Basaglar 12 units at bedtime with meal coverage/SSI.                            --Lantus decreased to 25 units on 11/05. May need to decrease further as activity increases. CBGs range from 98-195 currently.  11.NICM s/p AICD: Monitor for symptoms with increase in mobility.  Continue Lipitor, coreg, ASA, fenofibrate and Lasix  12. Nausea: Has been intermittent. Question due to constipation--will change colace to Senna S and add pepcid for dyspepsia.  13. Overweight (BMI 25.46): Dietary consult ordered for education.   LOS: 1 days A FACE TO FACE EVALUATION WAS PERFORMED  Clide Deutscher Tashe Purdon 11/17/2019, 10:32 AM

## 2019-11-17 NOTE — Evaluation (Signed)
Occupational Therapy Assessment and Plan  Patient Details  Name: Anthony Bullock MRN: [id number] Date of Birth: 1960/03/04  OT Diagnosis: abnormal posture, acute pain, muscle weakness (generalized) and swelling of limb Rehab Potential: Rehab Potential (ACUTE ONLY): Good ELOS: 10-14 days   Today's Date: 11/17/2019 OT Individual Time: 1300-1400 OT Individual Time Calculation (min): 60 min     Hospital Problem: Principal Problem:   Below-knee amputation of left lower extremity (HCC) Active Problems:   Pressure injury of back, stage 2 ([place])   Past Medical History:  Past Medical History:  Diagnosis Date  . 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 Allergic rhinitis   . Anemia   . Arthritis   . Chronic systolic heart failure ([place])    a. ECHO (12/2012) EF 25-30%, HK entireanteroseptal myocardium //  b.  EF 25%, diffuse HK, grade 1 diastolic dysfunction, MAC, mild LAE, normal RVSF, trivial pericardial effusion  . COPD (chronic obstructive pulmonary disease) ([place])   . Diabetes mellitus type II   . Diabetic nephropathy ([place])   . Diabetic neuropathy ([place])   . ESRD on hemodialysis [facility])    started HD June 2017, goes to [facility] unit, Dr Hinda Lenis  . History of cardiac catheterization    a.Myoview 1/15:  There is significant left ventricular dysfunction. There may be slight scar at the apex. There is no significant ischemia. LV Ejection Fraction: 27%  //  b. RHC/LHC (1/15) with mean RA 6, PA 47/22 mean 33, mean PCWP 20, PVR 2.5 WU, CI 2.5; 80% dLAD stenosis, 70% diffuse large D.   . History of kidney stones   . Hyperlipidemia   . Hypertension   . Kidney stones   . NICM (nonischemic cardiomyopathy) ([place])    Primarily nonischemic. Echo (12/14) with EF 25-30%. Echo (3/15) with EF 25%, mild to moderately dilated LV, normal RV size and systolic function.   . Osteomyelitis ([place])    left fifth ray  . Pneumonia   . Urethral stricture   . Wears glasses     Past Surgical History:  Past Surgical History:  Procedure Laterality Date  . ABDOMINAL AORTOGRAM W/LOWER EXTREMITY N/A 03/30/2016   Procedure: Abdominal Aortogram w/Lower Extremity;  Surgeon: Angelia Mould, MD;  Location: [place] CV LAB;  Service: Cardiovascular;  Laterality: N/A;  . AMPUTATION Right 04/26/2016   Procedure: Right Below Knee Amputation;  Surgeon: Newt Minion, MD;  Location: [place];  Service: Orthopedics;  Laterality: Right;  . AMPUTATION Left 08/21/2019   Procedure: LEFT FOOT 5TH RAY AMPUTATION;  Surgeon: Newt Minion, MD;  Location: [place];  Service: Orthopedics;  Laterality: Left;  . AMPUTATION Left 11/13/2019   Procedure: LEFT BELOW KNEE AMPUTATION;  Surgeon: Newt Minion, MD;  Location: [place];  Service: Orthopedics;  Laterality: Left;  . AV FISTULA PLACEMENT Right 09/08/2015   Procedure: INSERTION OF 4-66m x 45cm  ARTERIOVENOUS (AV) GORE-TEX GRAFT RIGHT UPPER  ARM;  Surgeon: CAngelia Mould MD;  [place]  Service: Vascular;  Laterality: Right;  . AV FISTULA PLACEMENT Left 01/14/2016   Procedure: CREATION OF LEFT UPPER ARM ARTERIOVENOUS FISTULA;  Surgeon: CAngelia Mould MD;  [place]  Service: Vascular;  Laterality: Left;  . BASCILIC VEIN TRANSPOSITION Right 08/22/2014   Procedure: RIGHT UPPER ARM BASCILIC VEIN TRANSPOSITION;  Surgeon: CAngelia Mould MD;  [place]  Service: Vascular;  Laterality: Right;  . BELOW KNEE LEG AMPUTATION Right 04/26/2016  . CARDIAC  CATHETERIZATION    . CARDIAC DEFIBRILLATOR PLACEMENT  06/27/2013   Sub Q       BY DR Caryl Comes  . CATARACT EXTRACTION W/PHACO Right 08/06/2018   Procedure: CATARACT EXTRACTION PHACO AND INTRAOCULAR LENS PLACEMENT (IOC);  Surgeon: Baruch Goldmann, MD;  Location: [facility];  Service: Ophthalmology;  Laterality: Right;  CDE: 4.06  . CATARACT EXTRACTION W/PHACO Left 08/20/2018   Procedure: CATARACT EXTRACTION PHACO AND INTRAOCULAR LENS PLACEMENT (IOC);  Surgeon: Baruch Goldmann, MD;  Location: [facility];  Service: Ophthalmology;  Laterality: Left;  CDE: 6.76  . COLONOSCOPY WITH PROPOFOL N/A 07/22/2015   Procedure: COLONOSCOPY WITH PROPOFOL;  Surgeon: Doran Stabler, MD;  [facility];  Service: Gastroenterology;  Laterality: N/A;  . FEMORAL-POPLITEAL BYPASS GRAFT Right 03/31/2016   Procedure: BYPASS GRAFT FEMORAL-POPLITEAL ARTERY USING RIGHT GREATER SAPHENOUS NONREVERSED VEIN;  Surgeon: Angelia Mould, MD;  Location: [place];  Service: Vascular;  Laterality: Right;  . HERNIA REPAIR    . I & D EXTREMITY Right 03/31/2016   Procedure: IRRIGATION AND DEBRIDEMENT FOOT;  Surgeon: Angelia Mould, MD;  Location: [place];  Service: Vascular;  Laterality: Right;  . IMPLANTABLE CARDIOVERTER DEFIBRILLATOR IMPLANT N/A 06/27/2013   Procedure: SUB Q ICD;  Surgeon: Deboraha Sprang, MD;  Location: [facility] CATH LAB;  Service: Cardiovascular;  Laterality: N/A;  . INTRAOPERATIVE ARTERIOGRAM Right 03/31/2016   Procedure: INTRA OPERATIVE ARTERIOGRAM;  Surgeon: Angelia Mould, MD;  Location: [place];  Service: Vascular;  Laterality: Right;  . IR GENERIC HISTORICAL Right 11/30/2015   IR THROMBECTOMY AV FISTULA W/THROMBOLYSIS/PTA INC/SHUNT/IMG RIGHT 11/30/2015 Aletta Edouard, MD [facility]  . IR GENERIC HISTORICAL  11/30/2015   IR US GUIDE VASC ACCESS RIGHT 11/30/2015 Aletta Edouard, MD [facility]  . IR GENERIC HISTORICAL Right 12/15/2015   IR THROMBECTOMY AV FISTULA W/THROMBOLYSIS/PTA/STENT INC/SHUNT/IMG RT 12/15/2015 Arne Cleveland, MD [facility]  . IR GENERIC HISTORICAL  12/15/2015   IR US GUIDE VASC ACCESS RIGHT 12/15/2015 Arne Cleveland, MD [facility]  . IR GENERIC HISTORICAL  12/28/2015   IR FLUORO GUIDE CV LINE RIGHT 12/28/2015 Marybelle Killings, MD [facility]  . IR GENERIC HISTORICAL  12/28/2015   IR US GUIDE VASC ACCESS RIGHT 12/28/2015 Marybelle Killings, MD [facility]  . LEFT A ND RIGHT HEART CATH  01/30/2013   DR Sung Amabile  . LEFT AND RIGHT HEART  CATHETERIZATION WITH CORONARY ANGIOGRAM N/A 01/30/2013   Procedure: LEFT AND RIGHT HEART CATHETERIZATION WITH CORONARY ANGIOGRAM;  Surgeon: Jolaine Artist, MD;  Location: [facility] CATH LAB;  Service: Cardiovascular;  Laterality: N/A;  . PERIPHERAL VASCULAR CATHETERIZATION Right 01/26/2015   Procedure: A/V Fistulagram;  Surgeon: Angelia Mould, MD;  Location: [place] [facility];  Service: Cardiovascular;  Laterality: Right;  . reapea urethral surgery for recurrent obstruction  2011  . TOTAL KNEE ARTHROPLASTY Right 2007  . VEIN HARVEST Right 03/31/2016   Procedure: RIGHT GREATER SAPHENOUS VEIN HARVEST;  Surgeon: Angelia Mould, MD;  Location: [place];  Service: Vascular;  Laterality: Right;    Assessment & Plan Clinical Impression: Anthony Bullock is a 59 year old male with history of T2DM with nephropathy/neuropathy, ESRD- HD, NICM s/p AICD, left foot ulcer with osteomyelitis s/p toe amputation with poor wound healing and gangrenous changes. He was admitted on 11/13/19 for L-BKA by Dr. Sharol Given. Post op  HD ongoing on TTS. He has had issues with hypoglycemia and insulin regimen adjusted per recommendations by diabetes coordinater. Therapy ongoing and patient limited by balance  deficits with posterior lean with standing as well as issues with endurance affecting ADLs and mobility. CIR recommended due to functional decline.    Patient currently requires min-mod with basic self-care skills secondary to bil BKAs, decreased cardiorespiratoy endurance and decreased sitting balance, decreased postural control, decreased balance strategies and difficulty maintaining precautions.  Prior to hospitalization, patient could complete BADLs with modified independent .  Patient will benefit from skilled intervention to increase independence with basic self-care skills prior to discharge home with care partner.  Anticipate patient will require 24 hour supervision and follow up home health.  OT - End of  Session Endurance Deficit: Yes Endurance Deficit Description: decreased activity tolerance, orthostatic hypotension with syncopal episode in standing OT Assessment Rehab Potential (ACUTE ONLY): Good OT Barriers to Discharge: Home environment access/layout;Decreased caregiver support;Wound Care;Weight bearing restrictions (providing supervision when spouse's FMLA runs out) OT Patient demonstrates impairments in the following area(s): Balance;Safety;Skin Integrity;Endurance;Motor;Pain OT Basic ADL's Functional Problem(s): Grooming;Bathing;Dressing;Toileting OT Advanced ADL's Functional Problem(s): Simple Meal Preparation OT Transfers Functional Problem(s): Toilet OT Additional Impairment(s): None OT Plan OT Intensity: Minimum of 1-2 x/day, 45 to 90 minutes OT Frequency: 5 out of 7 days OT Duration/Estimated Length of Stay: 10-14 days OT Treatment/Interventions: Balance/vestibular training;Discharge planning;Pain management;Self Care/advanced ADL retraining;Therapeutic Activities;UE/LE Coordination activities;Therapeutic Exercise;Skin care/wound managment;Patient/family education;Functional mobility training;Disease mangement/prevention;Community reintegration;DME/adaptive equipment instruction;Psychosocial support;UE/LE Strength taining/ROM;Wheelchair propulsion/positioning OT Self Feeding Anticipated Outcome(s): No goal OT Basic Self-Care Anticipated Outcome(s): Supervision OT Toileting Anticipated Outcome(s): Supervision OT Bathroom Transfers Anticipated Outcome(s): Supervision OT Recommendation Recommendations for Other Services: Neuropsych consult Patient destination: Home Follow Up Recommendations: Home health OT;24 hour supervision/assistance Equipment Recommended: To be determined   OT Evaluation Precautions/Restrictions  Precautions Precautions: Fall;Other (comment) Precaution Comments: L BKA wound vac and limb guard; h/o R BKA (prosthetic in room) Restrictions Weight Bearing  Restrictions: Yes LLE Weight Bearing: Non weight bearing Other Position/Activity Restrictions: NWB L LE General PT Missed Treatment Reason: Other (Comment) (symptomatic low BP) Vital Signs Therapy Vitals Temp: 98 F (36.7 C) Temp Source: Oral Pulse Rate: 67 Resp: 17 BP: (!) 123/52 Patient Position (if appropriate): Lying Oxygen Therapy SpO2: 98 % O2 Device: Room Air Pain Pain Assessment Pain Scale: 0-10 Pain Score: 4  Pain Type: Acute pain Pain Location: Knee Pain Orientation: Left Pain Descriptors / Indicators: Aching Pain Onset: On-going Patients Stated Pain Goal: 3 Pain Intervention(s): Medication (See eMAR) (oxycodone given) Multiple Pain Sites: No Home Living/Prior Functioning Home Living Available Help at Discharge: Family, Available 24 hours/day (spouse Manuela Schwartz works from home, plans to take [employer]) Type of Home: House Home Access: Ramped entrance Home Layout: One level Bathroom Shower/Tub:  (pt sponge bathes) Bathroom Toilet: Handicapped height Bathroom Accessibility: Yes Additional Comments: Wheelchair will not fit in bathroom/bedroom doorways. Wife works full time, but reports able to work from home as needed. 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 Has required increased assistance for ADLs/transfers since foot amputations Vision Baseline Vision/History:  Wears  glasses Wears Glasses: At all times Patient Visual Report: No change from baseline Vision Assessment?: No apparent visual deficits Perception  Perception: Within Functional Limits Praxis Praxis: Intact Cognition Overall Cognitive Status: Within Functional Limits for tasks assessed Arousal/Alertness: Awake/alert Orientation Level: Person;Place;Situation Person: Oriented Place: Oriented Situation: Oriented Year: 2021 Month: November Day of Week: Correct Memory: Appears intact Immediate Memory Recall: Sock;Blue;Bed Memory Recall Sock: Without Cue Memory Recall Blue: Without Cue Memory Recall Bed: Without Cue Awareness: Appears intact Problem Solving: Appears intact Safety/Judgment: Appears intact Sensation Sensation Light Touch: Impaired by gross assessment Light Touch Impaired Details: Impaired LLE Coordination Gross Motor Movements are Fluid and Coordinated: No Fine Motor Movements are Fluid and Coordinated: No Coordination and Movement Description: New L BKA with R BKA with prosthesis causing deficits for postural control and activity tolerance, mildly tremulous UEs Finger Nose Finger Test: Mildly tremulous bilaterally, per spouse this is baseline Motor  Motor Motor: Abnormal postural alignment and control Motor - Skilled Clinical Observations: decreased postural control due to new L BKA with R BKA with prosthesis  Trunk/Postural Assessment  Cervical Assessment Cervical Assessment: Exceptions to [facility] (forward head) Thoracic Assessment Thoracic Assessment: Exceptions to [facility] (rounded shoulders) Lumbar Assessment Lumbar Assessment: Exceptions to [facility] (posterior pelvic tilt) Postural Control Postural Control: Deficits on evaluation (limited during dynamic sitting activity)  Balance Balance Balance Assessed: Yes Static Sitting Balance Static Sitting - Balance Support: Right upper extremity supported;Left upper extremity supported;Feet unsupported Static Sitting -  Level of Assistance: 5: Stand by assistance Dynamic Sitting Balance Dynamic Sitting - Balance Support: Feet unsupported;During functional activity Dynamic Sitting - Level of Assistance: 4: Min assist (LB dressing via lateral leans) Dynamic Sitting - Balance Activities: Lateral lean/weight shifting;Forward lean/weight shifting;Reaching for objects;Reaching across midline Static Standing Balance Static Standing - Balance Support: Bilateral upper extremity supported;During functional activity (with R posthesis donned) Static Standing - Level of Assistance: 4: Min assist Extremity/Trunk Assessment RUE Assessment RUE Assessment: Within Functional Limits Active Range of Motion (AROM) Comments: Shoulder flexion ~110 degrees, ~90 degrees shoulder abduction, WNL other ranges distally General Strength Comments: 4-/5 grossly LUE Assessment LUE Assessment: Within Functional Limits Active Range of Motion (AROM) Comments: Shoulder flexion ~110 degrees, shoulder abduction ~90 degrees, WNL other ranges distally General Strength Comments: 4-/5 grossly  Care Tool Care Tool Self Care Eating    setup assist    Oral Care  Oral care, brush teeth, clean dentures activity did not occur: Refused      Bathing   Body parts bathed by patient: Right arm;Left arm;Chest;Abdomen;Front perineal area;Right upper leg;Left upper leg;Face Body parts bathed by helper: Buttocks Body parts n/a: Right lower leg;Left lower leg Assist Level: Minimal Assistance - Patient > 75%    Upper Body Dressing(including orthotics)   What is the patient wearing?: Pull over shirt   Assist Level: Set up assist    Lower Body Dressing (excluding footwear)   What is the patient wearing?: Underwear/pull up Assist for lower body dressing: Contact Guard/Touching assist    Putting on/Taking off footwear Putting on/taking off footwear activity did not occur: N/A (bilateral BKAs)           Care Tool Toileting Toileting activity    Assist for toileting: Moderate Assistance - Patient 50 - 74%     Care Tool Bed Mobility Roll left and right activity   Roll left and right assist level: Minimal Assistance - Patient > 75%    Sit to lying activity   Sit to lying assist level: Supervision/Verbal cueing  Lying to sitting edge of bed activity   Lying to sitting edge of bed assist level: Moderate Assistance - Patient 50 - 74%     Care Tool Transfers Sit to stand transfer Sit to stand activity did not occur: Safety/medical concerns (New L BKA with R posthesis, decreased strength balance without skilled intervention)      Chair/bed transfer   Chair/bed transfer assist level: Moderate Assistance - Patient 50 - 74% (lateral scoot transfer bed>w/c)     Toilet transfer   Assist Level: Minimal Assistance - Patient > 75%       Refer to Care Plan for Long Term Goals  SHORT TERM GOAL WEEK 1 OT Short Term Goal 1 (Week 1): Pt will complete 3/3 components of toileting with Min A OT Short Term Goal 2 (Week 1): Pt will be able to independently state 2 pressure relief strategies to implement when sitting OOB OT Short Term Goal 3 (Week 1): Pt will complete 2 grooming tasks while sitting at the sink to increase OOB tolerance  Recommendations for other services: Neuropsych   Skilled Therapeutic Intervention Skilled OT session completed with focus on initial evaluation, education on OT role/POC, and establishment of patient-centered goals.   Pt greeted in bed, needing assistance getting off the bedpan, spouse Manuela Schwartz present. Pt required Mod A for hygiene in sidelying, able to assist therapist with supervision. After he completed hygiene in the front pt completed supine<sit with supervision, small LOBs during transition. He then engaged in UB/LB bathing/dressing tasks using lateral leans, supervision for dynamic sitting balance due to a few small LOBs. Able to complete 3/3 components of donning his underwear. Note that afterwards pt  had to lie down for a few minutes due to fatigue. Discussed PLOF and home layout with pt and spouse at this time. Transitioned to simulated BSC transfer using AP method, education emphasis placed on pushing through hands to elevate and move body vs scooting due to pressure sore on buttocks. After pt returned to bed he reported really having to use the [facility]. OT retrieved a drop arm BSC and AP transfer completed again with CGA-Min A. Modifications made to increase his comfort with pt still uncomfortable while sitting. He would benefit from a padded drop arm BSC. Pt left in care of RN for toileting needs and pain medicine.    ADL ADL Eating: Not assessed Grooming: Setup Where Assessed-Grooming: Edge of bed Upper Body Bathing: Setup Where Assessed-Upper Body Bathing: Edge of bed Lower Body Bathing: Moderate assistance Where Assessed-Lower Body Bathing: Edge of bed;Bed level Upper Body Dressing: Setup Where Assessed-Upper Body Dressing: Edge of bed Lower Body Dressing: Contact guard Where Assessed-Lower Body Dressing: Edge of bed Toileting: Moderate assistance Where Assessed-Toileting: Bedside Commode Toilet Transfer: Other (comment) (AP transfer) Tub/Shower Transfer: Not assessed Mobility  Transfers Sit to Stand: Moderate Assistance - Patient 50-74% (// bars) Stand to Sit: Moderate Assistance - Patient 50-74% (// bars)   Discharge Criteria: Patient will be discharged from OT if patient refuses treatment 3 consecutive times without medical reason, if treatment goals not met, if there is a change in medical status, if patient makes no progress towards goals or if patient is discharged from hospital.  The above assessment, treatment plan, treatment alternatives and goals were discussed and mutually agreed upon: by patient and by family  Skeet Simmer 11/17/2019, 4:22 PM

## 2019-11-17 NOTE — Evaluation (Signed)
Physical Therapy Assessment and Plan  Patient Details  Name: Anthony Bullock MRN: [id number] Date of Birth: 28-Dec-1960  PT Diagnosis: Abnormal posture, Abnormality of gait, Difficulty walking, Dizziness and giddiness, Edema, Impaired sensation, Muscle weakness and Pain in L residual limb Rehab Potential: Good ELOS: 10-14 days   Today's Date: 11/17/2019 PT Individual Time: 1100-1205 PT Individual Time Calculation (min): 65 min    Hospital Problem: Principal Problem:   Below-knee amputation of left lower extremity (HCC) Active Problems:   Pressure injury of back, stage 2 ([place])   Past Medical History:  Past Medical History:  Diagnosis Date  . 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 Allergic rhinitis   . Anemia   . Arthritis   . Chronic systolic heart failure ([place])    a. ECHO (12/2012) EF 25-30%, HK entireanteroseptal myocardium //  b.  EF 25%, diffuse HK, grade 1 diastolic dysfunction, MAC, mild LAE, normal RVSF, trivial pericardial effusion  . COPD (chronic obstructive pulmonary disease) ([place])   . Diabetes mellitus type II   . Diabetic nephropathy ([place])   . Diabetic neuropathy ([place])   . ESRD on hemodialysis [facility])    started HD June 2017, goes to [facility] HD unit, Dr Hinda Lenis  . History of cardiac catheterization    a.Myoview 1/15:  There is significant left ventricular dysfunction. There may be slight scar at the apex. There is no significant ischemia. LV Ejection Fraction: 27%  //  b. RHC/LHC (1/15) with mean RA 6, PA 47/22 mean 33, mean PCWP 20, PVR 2.5 WU, CI 2.5; 80% dLAD stenosis, 70% diffuse large D.   . History of kidney stones   . Hyperlipidemia   . Hypertension   . Kidney stones   . NICM (nonischemic cardiomyopathy) ([place])    Primarily nonischemic. Echo (12/14) with EF 25-30%. Echo (3/15) with EF 25%, mild to moderately dilated LV, normal RV size and systolic function.   . Osteomyelitis ([place])    left fifth ray  . Pneumonia    . Urethral stricture   . Wears glasses    Past Surgical History:  Past Surgical History:  Procedure Laterality Date  . ABDOMINAL AORTOGRAM W/LOWER EXTREMITY N/A 03/30/2016   Procedure: Abdominal Aortogram w/Lower Extremity;  Surgeon: Angelia Mould, MD;  Location: [place] CV [facility];  Service: Cardiovascular;  Laterality: N/A;  . AMPUTATION Right 04/26/2016   Procedure: Right Below Knee Amputation;  Surgeon: Newt Minion, MD;  Location: [place];  Service: Orthopedics;  Laterality: Right;  . AMPUTATION Left 08/21/2019   Procedure: LEFT FOOT 5TH RAY AMPUTATION;  Surgeon: Newt Minion, MD;  Location: [place];  Service: Orthopedics;  Laterality: Left;  . AMPUTATION Left 11/13/2019   Procedure: LEFT BELOW KNEE AMPUTATION;  Surgeon: Newt Minion, MD;  Location: [place];  Service: Orthopedics;  Laterality: Left;  . AV FISTULA PLACEMENT Right 09/08/2015   Procedure: INSERTION OF 4-57m x 45cm  ARTERIOVENOUS (AV) GORE-TEX GRAFT RIGHT UPPER  ARM;  Surgeon: CAngelia Mould MD;  [place]  Service: Vascular;  Laterality: Right;  . AV FISTULA PLACEMENT Left 01/14/2016   Procedure: CREATION OF LEFT UPPER ARM ARTERIOVENOUS FISTULA;  Surgeon: CAngelia Mould MD;  [place]  Service: Vascular;  Laterality: Left;  . BASCILIC VEIN TRANSPOSITION Right 08/22/2014   Procedure: RIGHT UPPER ARM BASCILIC VEIN TRANSPOSITION;  Surgeon: CAngelia Mould MD;  [place]  Service: Vascular;  Laterality: Right;  . BELOW KNEE LEG AMPUTATION  Right 04/26/2016  . CARDIAC CATHETERIZATION    . CARDIAC DEFIBRILLATOR PLACEMENT  06/27/2013   Sub Q       BY DR Caryl Comes  . CATARACT EXTRACTION W/PHACO Right 08/06/2018   Procedure: CATARACT EXTRACTION PHACO AND INTRAOCULAR LENS PLACEMENT (IOC);  Surgeon: Baruch Goldmann, MD;  Location: [facility];  Service: Ophthalmology;  Laterality: Right;  CDE: 4.06  . CATARACT EXTRACTION W/PHACO Left 08/20/2018   Procedure: CATARACT EXTRACTION PHACO AND INTRAOCULAR  LENS PLACEMENT (IOC);  Surgeon: Baruch Goldmann, MD;  Location: [facility];  Service: Ophthalmology;  Laterality: Left;  CDE: 6.76  . COLONOSCOPY WITH PROPOFOL N/A 07/22/2015   Procedure: COLONOSCOPY WITH PROPOFOL;  Surgeon: Doran Stabler, MD;  [facility];  Service: Gastroenterology;  Laterality: N/A;  . FEMORAL-POPLITEAL BYPASS GRAFT Right 03/31/2016   Procedure: BYPASS GRAFT FEMORAL-POPLITEAL ARTERY USING RIGHT GREATER SAPHENOUS NONREVERSED VEIN;  Surgeon: Angelia Mould, MD;  Location: [place];  Service: Vascular;  Laterality: Right;  . HERNIA REPAIR    . I & D EXTREMITY Right 03/31/2016   Procedure: IRRIGATION AND DEBRIDEMENT FOOT;  Surgeon: Angelia Mould, MD;  Location: [place];  Service: Vascular;  Laterality: Right;  . IMPLANTABLE CARDIOVERTER DEFIBRILLATOR IMPLANT N/A 06/27/2013   Procedure: SUB Q ICD;  Surgeon: Deboraha Sprang, MD;  Location: [facility] CATH LAB;  Service: Cardiovascular;  Laterality: N/A;  . INTRAOPERATIVE ARTERIOGRAM Right 03/31/2016   Procedure: INTRA OPERATIVE ARTERIOGRAM;  Surgeon: Angelia Mould, MD;  Location: [place];  Service: Vascular;  Laterality: Right;  . IR GENERIC HISTORICAL Right 11/30/2015   IR THROMBECTOMY AV FISTULA W/THROMBOLYSIS/PTA INC/SHUNT/IMG RIGHT 11/30/2015 Aletta Edouard, MD [facility]  . IR GENERIC HISTORICAL  11/30/2015   IR US GUIDE VASC ACCESS RIGHT 11/30/2015 Aletta [place], MD [facility]  . IR GENERIC HISTORICAL Right 12/15/2015   IR THROMBECTOMY AV FISTULA W/THROMBOLYSIS/PTA/STENT INC/SHUNT/IMG RT 12/15/2015 Arne Cleveland, MD [facility]  . IR GENERIC HISTORICAL  12/15/2015   IR US GUIDE VASC ACCESS RIGHT 12/15/2015 Arne Cleveland, MD [facility]  . IR GENERIC HISTORICAL  12/28/2015   IR FLUORO GUIDE CV LINE RIGHT 12/28/2015 Marybelle Killings, MD [facility]  . IR GENERIC HISTORICAL  12/28/2015   IR US GUIDE VASC ACCESS RIGHT 12/28/2015 Marybelle Killings, MD [facility]  . LEFT A ND RIGHT HEART CATH  01/30/2013   DR  Sung Amabile  . LEFT AND RIGHT HEART CATHETERIZATION WITH CORONARY ANGIOGRAM N/A 01/30/2013   Procedure: LEFT AND RIGHT HEART CATHETERIZATION WITH CORONARY ANGIOGRAM;  Surgeon: Jolaine Artist, MD;  Location: [facility] CATH LAB;  Service: Cardiovascular;  Laterality: N/A;  . PERIPHERAL VASCULAR CATHETERIZATION Right 01/26/2015   Procedure: A/V Fistulagram;  Surgeon: Angelia Mould, MD;  Location: [place] CV LAB;  Service: Cardiovascular;  Laterality: Right;  . reapea urethral surgery for recurrent obstruction  2011  . TOTAL KNEE ARTHROPLASTY Right 2007  . VEIN HARVEST Right 03/31/2016   Procedure: RIGHT GREATER SAPHENOUS VEIN HARVEST;  Surgeon: Angelia Mould, MD;  [facility];  Service: Vascular;  Laterality: Right;    Assessment & Plan Clinical Impression: Patient is a 59 y.o. year old male with history of T2DM with nephropathy/neuropathy, ESRD- HD, NICM s/p AICD, left foot ulcer with osteomyelitis s/p toe amputation with poor wound healing and gangrenous changes. He was admitted on 11/13/19 for L-BKA by Dr. Sharol Given. Post op  HD ongoing on TTS. He has had issues with hypoglycemia and insulin regimen adjusted per recommendations by diabetes coordinater. Therapy ongoing and  patient limited by balance deficits with posterior lean with standing as well as issues with endurance affecting ADLs and mobility. CIR recommended due to functional decline.   Patient transferred to CIR on 11/16/2019 .   Patient currently requires mod with mobility secondary to muscle weakness and muscle joint tightness, decreased cardiorespiratoy endurance and decreased sitting balance, decreased standing balance, decreased postural control, decreased balance strategies and difficulty maintaining precautions.  Prior to hospitalization, patient was min with mobility since L foot amputation, modified independent with use of R prosthesis prior to amputation and lived with Spouse in a House home.  Home access is  Ramped  entrance (steep ramp).  Patient will benefit from skilled PT intervention to maximize safe functional mobility, minimize fall risk and decrease caregiver burden for planned discharge home with intermittent assist.  Anticipate patient will benefit from follow up [facility] at discharge.  PT - End of Session Activity Tolerance: Tolerates 30+ min activity with multiple rests Endurance Deficit: Yes Endurance Deficit Description: decreased activity tolerance, orthostatic hypotension with syncopal episode in standing PT Assessment Rehab Potential (ACUTE/IP ONLY): Good PT Barriers to Discharge: [place] home environment;Decreased caregiver support;Home environment access/layout;Lack of/limited family support;Hemodialysis;Weight bearing restrictions PT Barriers to Discharge Comments: patient has a steep ramp to enter home, may have church assist with modifying ramp to regulation, patient's wife is only person available for assist at home, narrow doorways limite w/c accessability in the home PT Patient demonstrates impairments in the following area(s): Balance;Pain;Safety;Edema;Endurance;Sensory;Motor;Skin Integrity;Nutrition PT Transfers Functional Problem(s): Bed Mobility;Bed to Chair;Car;Furniture PT Locomotion Functional Problem(s): Ambulation;Wheelchair Mobility PT Plan PT Intensity: Minimum of 1-2 x/day ,45 to 90 minutes PT Frequency: 5 out of 7 days PT Duration Estimated Length of Stay: 10-14 days PT Treatment/Interventions: Ambulation/gait training;Discharge planning;DME/adaptive equipment instruction;Functional mobility training;Pain management;Psychosocial support;Splinting/orthotics;Therapeutic Activities;UE/LE Strength taining/ROM;Balance/vestibular training;Community reintegration;Disease management/prevention;Functional electrical stimulation;Neuromuscular re-education;Patient/family education;Skin care/wound management;Therapeutic Exercise;UE/LE Coordination activities;Wheelchair  propulsion/positioning PT Transfers Anticipated Outcome(s): mod I w/c level PT Locomotion Anticipated Outcome(s): mod I w/c level PT Recommendation Recommendations for Other Services: Neuropsych consult;Therapeutic Recreation consult Therapeutic Recreation Interventions: Stress management Follow Up Recommendations: Home health PT Patient destination: Home Equipment Details: patient has w/c and RW, will need L amputee pad for w/c   PT Evaluation Precautions/Restrictions Precautions Precautions: Fall;Other (comment) Precaution Comments: L BKA wound vac and limb guard; h/o R BKA (prosthetic in room) Restrictions LLE Weight Bearing: Non weight bearing General   Vital Signs Pain Pain Assessment Pain Scale: 0-10 Pain Score: 2  Pain Type: Acute pain;Surgical pain Pain Location: Leg Pain Orientation: Left Pain Descriptors / Indicators: Aching Pain Onset: On-going Patients Stated Pain Goal: 3 Pain Intervention(s): Medication (See eMAR) (oxycodone) Multiple Pain Sites: No Home Living/Prior Functioning Home Living Available Help at Discharge: Family;Available PRN/intermittently Type of Home: House Home Access: Ramped entrance (steep ramp) Home Layout: One level Bathroom Toilet: Handicapped height Bathroom Accessibility: No Additional Comments: Wheelchair will not fit in bathroom/bedroom doorways. Wife works full time, but reports able to work from home as needed. Have some church members that can assist as needed  Lives With: Spouse Prior Function Comments: Prior to recent foot amputation, pt Independent in all daily tasks, was walking with R prosthetic LE and no AD. Has required increased assistance for ADLs/transfers since foot amputations Vision/Perception  Perception Perception: Within Functional Limits Praxis Praxis: Intact  Cognition Overall Cognitive Status: Within Functional Limits for tasks assessed Arousal/Alertness: Awake/alert Orientation Level: Oriented  X4 Memory: Appears intact Awareness: Appears intact Problem Solving: Appears intact Safety/Judgment: Appears intact Sensation Sensation Light  Touch: Impaired by gross assessment Light Touch Impaired Details: Impaired LLE Coordination Gross Motor Movements are Fluid and Coordinated: No Fine Motor Movements are Fluid and Coordinated: Yes Coordination and Movement Description: New L BKA with R BKA with prosthesis causing deficits for postural control and activity tolerance Motor  Motor Motor: Abnormal postural alignment and control Motor - Skilled Clinical Observations: decrwased postural control due to new L BKA with R BKA with prosthesis   Trunk/Postural Assessment  Cervical Assessment Cervical Assessment: Exceptions to [facility] (forward head) Thoracic Assessment Thoracic Assessment: Exceptions to [facility] (rounded shoulders) Lumbar Assessment Lumbar Assessment: Exceptions to [facility] (posterior pelvic tilt) Postural Control Postural Control: Deficits on evaluation (decreased/delayed)  Balance Balance Balance Assessed: Yes Static Sitting Balance Static Sitting - Balance Support: Right upper extremity supported;Left upper extremity supported;Feet unsupported Static Sitting - Level of Assistance: 5: Stand by assistance Dynamic Sitting Balance Dynamic Sitting - Balance Support: Right upper extremity supported;Left upper extremity supported;Feet unsupported;During functional activity Dynamic Sitting - Level of Assistance: 4: Min assist Dynamic Sitting - Balance Activities: Lateral lean/weight shifting;Forward lean/weight shifting;Reaching for objects;Reaching across midline Static Standing Balance Static Standing - Balance Support: Bilateral upper extremity supported;During functional activity (with R posthesis donned) Static Standing - Level of Assistance: 4: Min assist Extremity Assessment  RLE Assessment RLE Assessment: Exceptions to [facility] Active Range of Motion (AROM) Comments: WFL, knee  flexion ~90 degrees in sitting, General Strength Comments: Grossly in sitting 4+/5 throughout LLE Assessment LLE Assessment: Exceptions to [facility] Active Range of Motion (AROM) Comments: Lacking ~10 deg of knee extension and grossly 70 degrees of knee flexion General Strength Comments: Grossly at elast 3/5 througout available range, limited formal testing by NWB status and pain  Care Tool Care Tool Bed Mobility Roll left and right activity   Roll left and right assist level: Minimal Assistance - Patient > 75%    Sit to lying activity   Sit to lying assist level: Supervision/Verbal cueing    Lying to sitting edge of bed activity   Lying to sitting edge of bed assist level: Moderate Assistance - Patient 50 - 74%     Care Tool Transfers Sit to stand transfer Sit to stand activity did not occur: Safety/medical concerns (New L BKA with R posthesis, decreased strength balance without skilled intervention)      Chair/bed transfer   Chair/bed transfer assist level: Moderate Assistance - Patient 50 - 74% (lateral scoot transfer bed>w/c)     Toilet transfer        Car transfer   Car transfer assist level: Moderate Assistance - Patient 50 - 74% (lateral scoot transfer to sedan height car simulator)      Care Tool Locomotion Ambulation Ambulation activity did not occur: Safety/medical concerns (new L BKA with R prosthesis, decreased strength/balance)        Walk 10 feet activity Walk 10 feet activity did not occur: Safety/medical concerns       Walk 50 feet with 2 turns activity Walk 50 feet with 2 turns activity did not occur: Safety/medical concerns      Walk 150 feet activity Walk 150 feet activity did not occur: Safety/medical concerns      Walk 10 feet on uneven surfaces activity Walk 10 feet on uneven surfaces activity did not occur: Safety/medical concerns      Stairs Stair activity did not occur: Safety/medical concerns        Walk up/down 1 step activity Walk up/down 1  step or curb (drop down) activity did  not occur: Safety/medical concerns     Walk up/down 4 steps activity did not occuR: Safety/medical concerns  Walk up/down 4 steps activity      Walk up/down 12 steps activity Walk up/down 12 steps activity did not occur: Safety/medical concerns      Pick up small objects from floor Pick up small object from the floor (from standing position) activity did not occur: Safety/medical concerns (decreased balance/orthostasis in standing)      Wheelchair Will patient use wheelchair at discharge?: Yes Type of Wheelchair: Manual   Wheelchair assist level: Moderate Assistance - Patient 50 - 74% Max wheelchair distance: 89 ft with supervision, total A otherwise due to decreased activity tolerance  Wheel 50 feet with 2 turns activity   Assist Level: Supervision/Verbal cueing  Wheel 150 feet activity   Assist Level: Moderate Assistance - Patient 50 - 74%    Refer to Care Plan for Long Term Goals  SHORT TERM GOAL WEEK 1 PT Short Term Goal 1 (Week 1): Patient will perform basic transfers with CGA. PT Short Term Goal 2 (Week 1): [name] will perform sit to/from stand with min A using LRAD. PT Short Term Goal 3 (Week 1): Patient will demonstrate dynamic sitting balance with supervision >5 min.  Recommendations for other services: Neuropsych and Therapeutic Recreation  Stress management  Skilled Therapeutic Intervention In addition to the PT evaluation above, the patient performed the following skilled PT interventions: Patient in bed with L wound vac in place with his wife at bedside upon PT arrival. Patient alert and agreeable to PT session. Patient reported 4-5/10 L residual limb pain during session, RN made aware. PT provided repositioning, rest breaks, and distraction as pain interventions throughout session.   Patient with wound vac in place and limb guard donned throughout session. Educated on donning/doffing limb guard and importance on knee extension  ROM to patient and his wife during session.   Therapeutic Activity: Donned underwear bed level with max A to thread underwear over wound vac. Sitting EOB patient donned his R liner, but was unable to obtain good alignment due to decreased sitting balance, PT donned liner with total A for proper placement. He donned 2 socks over his liner and prosthesis with min A sitting EOB and supervision-min A for balance.  Transfers: Patient performed a lateral scoot transfer w/c>bed with min A. Provided cues for hand placement and head-hips relationship for proper technique and decreased assist with transfers. Patient performed sit to/from stand x1 in the // bars, unable to stand with RW due to decreased strength at this time, with min-mod A for boosting up. Provided verbal cues for R prosthesis placement and R and forward weight shift. Patient stood >30 sec with min A before reporting feeling light headed. Returned to sitting with min-mod A then patient with brief, <5 sec, syncopal episode in sitting. Unable to attain BP without cuff, HR 66, SPO2 100%. BP taken in the room, 92/48, RN made aware. Returned patient to bed as above (see evaluation). Patient missed 10 min of skilled PT due to symptomatic low BP/fatigue, RN made aware. Will attempt to make-up missed time as able.    Instructed pt in results of PT evaluation as detailed above, PT POC, rehab potential, rehab goals, and discharge recommendations. Additionally discussed CIR's policies regarding fall safety and use of chair alarm and/or quick release belt. Pt verbalized understanding and in agreement. Will update pt's family members as they become available.   Patient in bed with his wife present  at end of session with breaks locked, bed alarm set, and all needs within reach.   Discharge Criteria: Patient will be discharged from PT if patient refuses treatment 3 consecutive times without medical reason, if treatment goals not met, if there is a change in  medical status, if patient makes no progress towards goals or if patient is discharged from hospital.  The above assessment, treatment plan, treatment alternatives and goals were discussed and mutually agreed upon: by patient and by family  Doreene Burke PT, DPT  11/17/2019, 12:51 PM

## 2019-11-17 NOTE — Plan of Care (Signed)
°  Problem: RH Balance Goal: LTG: Patient will maintain dynamic sitting balance (OT) Description: LTG:  Patient will maintain dynamic sitting balance with assistance during activities of daily living (OT) Flowsheets (Taken 11/17/2019 1626) LTG: Pt will maintain dynamic sitting balance during ADLs with: Supervision/Verbal cueing   Problem: RH Grooming Goal: LTG Patient will perform grooming w/assist,cues/equip (OT) Description: LTG: Patient will perform grooming with assist, with/without cues using equipment (OT) Flowsheets (Taken 11/17/2019 1626) LTG: Pt will perform grooming with assistance level of: Independent with assistive device    Problem: RH Bathing Goal: LTG Patient will bathe all body parts with assist levels (OT) Description: LTG: Patient will bathe all body parts with assist levels (OT) Flowsheets (Taken 11/17/2019 1626) LTG: Pt will perform bathing with assistance level/cueing: Set up assist    Problem: RH Dressing Goal: LTG Patient will perform lower body dressing w/assist (OT) Description: LTG: Patient will perform lower body dressing with assist, with/without cues in positioning using equipment (OT) Flowsheets (Taken 11/17/2019 1626) LTG: Pt will perform lower body dressing with assistance level of: Set up assist   Problem: RH Toileting Goal: LTG Patient will perform toileting task (3/3 steps) with assistance level (OT) Description: LTG: Patient will perform toileting task (3/3 steps) with assistance level (OT)  Flowsheets (Taken 11/17/2019 1626) LTG: Pt will perform toileting task (3/3 steps) with assistance level: Supervision/Verbal cueing   Problem: RH Toilet Transfers Goal: LTG Patient will perform toilet transfers w/assist (OT) Description: LTG: Patient will perform toilet transfers with assist, with/without cues using equipment (OT) Flowsheets (Taken 11/17/2019 1626) LTG: Pt will perform toilet transfers with assistance level of: Supervision/Verbal cueing

## 2019-11-18 ENCOUNTER — Ambulatory Visit: Payer: Medicare Other | Admitting: Family Medicine

## 2019-11-18 ENCOUNTER — Inpatient Hospital Stay (HOSPITAL_COMMUNITY): Payer: Medicare Other | Admitting: Physical Therapy

## 2019-11-18 ENCOUNTER — Encounter (HOSPITAL_COMMUNITY): Payer: Self-pay | Admitting: Physical Medicine and Rehabilitation

## 2019-11-18 ENCOUNTER — Inpatient Hospital Stay (HOSPITAL_COMMUNITY): Payer: Medicare Other

## 2019-11-18 DIAGNOSIS — E440 Moderate protein-calorie malnutrition: Secondary | ICD-10-CM | POA: Insufficient documentation

## 2019-11-18 LAB — GLUCOSE, CAPILLARY
Glucose-Capillary: 173 mg/dL — ABNORMAL HIGH (ref 70–99)
Glucose-Capillary: 114 mg/dL — ABNORMAL HIGH (ref 70–99)
Glucose-Capillary: 62 mg/dL — ABNORMAL LOW (ref 70–99)
Glucose-Capillary: 77 mg/dL (ref 70–99)
Glucose-Capillary: 174 mg/dL — ABNORMAL HIGH (ref 70–99)

## 2019-11-18 LAB — SURGICAL PATHOLOGY

## 2019-11-18 MED ORDER — DOXERCALCIFEROL 4 MCG/2ML IV SOLN
2.5000 ug | INTRAVENOUS | Status: DC
  Administered 2019-11-19 – 2019-11-26 (×4): 2.5 ug via INTRAVENOUS
  Filled 2019-11-18 (×5): qty 2

## 2019-11-18 MED ORDER — INSULIN GLARGINE 100 UNIT/ML ~~LOC~~ SOLN
24.0000 [IU] | Freq: Every day | SUBCUTANEOUS | Status: DC
  Administered 2019-11-18 – 2019-11-25 (×8): 24 [IU] via SUBCUTANEOUS
  Filled 2019-11-18 (×9): qty 0.24

## 2019-11-18 MED ORDER — AMLODIPINE BESYLATE 5 MG PO TABS
5.0000 mg | ORAL_TABLET | Freq: Every day | ORAL | Status: DC
  Administered 2019-11-20 – 2019-11-29 (×9): 5 mg via ORAL
  Filled 2019-11-18 (×10): qty 1

## 2019-11-18 MED ORDER — SENNOSIDES-DOCUSATE SODIUM 8.6-50 MG PO TABS
1.0000 | ORAL_TABLET | Freq: Every day | ORAL | Status: DC
  Administered 2019-11-20: 1 via ORAL
  Filled 2019-11-18 (×3): qty 1

## 2019-11-18 MED ORDER — PROSOURCE PLUS PO LIQD
30.0000 mL | Freq: Two times a day (BID) | ORAL | Status: DC
  Administered 2019-11-18 – 2019-11-28 (×17): 30 mL via ORAL
  Filled 2019-11-18 (×17): qty 30

## 2019-11-18 MED ORDER — CINACALCET HCL 30 MG PO TABS
30.0000 mg | ORAL_TABLET | ORAL | Status: DC
  Administered 2019-11-19 – 2019-11-28 (×3): 30 mg via ORAL
  Filled 2019-11-18 (×5): qty 1

## 2019-11-18 NOTE — Progress Notes (Signed)
   11/18/19 1538  Clinical Encounter Type  Visited With Patient and family together  Visit Type Initial  Referral From Nurse  Consult/Referral To Chaplain  Spiritual Encounters  Spiritual Needs Literature (AD)  Chaplain responded to consult for Anthony Bullock.  We dicussed the AD and form was left with the Pt.  Informed when he is ready to have AD notarized please have the nurse page the [place].  Chaplain Aloura Matsuoka [place] [phone]

## 2019-11-18 NOTE — Progress Notes (Addendum)
[facility] Progress Note   Subjective: Seen in room. Eating lunch, wife at bedside. No new complaints this am.    Objective Vitals:   11/17/19 1510 11/17/19 1935 11/18/19 0500 11/18/19 0521  BP: (!) 123/52 (!) 108/56  (!) 132/54  Pulse: 67 70  67  Resp: 17 17  17   Temp: 98 F (36.7 C) 98 F (36.7 C)  98.8 F (37.1 C)  TempSrc: Oral Oral  Oral  SpO2: 98% 96%  97%  Weight:   79.8 kg   Height:       Physical Exam General: WNWD older man, nad  Heart: S1,S2 RRR No M/R/G Lungs: CTAB anteriorly Abdomen: S, NT Extremities: Bilateral BKA. L stump in protector, wound vac in place.  Dialysis Access: RIJ TD Multiple failed AV accesses.     Additional Objective Labs: Basic Metabolic Panel: Recent Labs  Lab 11/14/19 0651 11/15/19 0804 11/16/19 0847  NA 136 136 137  K 5.6* 4.6 5.4*  CL 100 97* 98  CO2 24 26 27   GLUCOSE 104* 93 116*  BUN 65* 34* 54*  CREATININE 11.29* 8.48* 11.85*  CALCIUM 8.6* 8.6* 8.8*  PHOS 3.4 4.3 5.2*   Liver Function Tests: Recent Labs  Lab 11/14/19 0651 11/15/19 0804 11/16/19 0847  ALBUMIN 3.0* 3.0* 2.7*   No results for input(s): LIPASE, AMYLASE in the last 168 hours. CBC: Recent Labs  Lab 11/14/19 0651 11/15/19 0804 11/16/19 0846  WBC 11.1* 9.0 8.9  HGB 11.1* 11.3* 10.4*  HCT 35.1* 36.3* 33.4*  MCV 90.0 91.2 93.3  PLT 148* 151 144*   Blood Culture    Component Value Date/Time   SDES BLOOD ARTERIAL LINE 09/03/2019 1642   SPECREQUEST  09/03/2019 1642    BOTTLES DRAWN AEROBIC AND ANAEROBIC Blood Culture adequate volume   CULT  09/03/2019 1642    NO GROWTH 5 DAYS Performed at [facility], [address] [place], [place] [address]    REPTSTATUS 09/08/2019 FINAL 09/03/2019 1642    Cardiac Enzymes: No results for input(s): CKTOTAL, CKMB, CKMBINDEX, TROPONINI in the last 168 hours. CBG: Recent Labs  Lab 11/17/19 1219 11/17/19 1653 11/17/19 2046 11/17/19 2229 11/18/19 0618  GLUCAP 84 286* 98 114* 173*    Iron Studies: No results for input(s): IRON, TIBC, TRANSFERRIN, FERRITIN in the last 72 hours. @lablastinr3 @ Studies/Results: No results found. Medications:  . [START ON 11/19/2019] amLODipine  5 mg Oral Daily  . aspirin EC  81 mg Oral Daily  . atorvastatin  80 mg Oral QHS  . carvedilol  25 mg Oral BID WC  . Chlorhexidine Gluconate Cloth  6 each Topical Q0600  . cinacalcet  30 mg Oral Q M,W,F-HD  . doxercalciferol  2.5 mcg Intravenous Q M,W,F-HD  . famotidine  10 mg Oral QHS  . fenofibrate  160 mg Oral Daily  . furosemide  120 mg Oral Daily  . gabapentin  100 mg Oral Daily  . gabapentin  200 mg Oral QHS  . heparin  5,000 Units Subcutaneous Q8H  . insulin aspart  0-5 Units Subcutaneous QHS  . insulin aspart  0-9 Units Subcutaneous TID WC  . insulin aspart  2 Units Subcutaneous TID WC  . insulin glargine  25 Units Subcutaneous QHS  . lanthanum  2,000 mg Oral TID with meals  . lanthanum  500 mg Oral With snacks  . loratadine  10 mg Oral Daily  . multivitamin  1 tablet Oral QHS  . senna-docusate  1 tablet Oral QPC supper  [facility] 400/600 2.2/5 bath TDC RIJ 83kg  -Hep 1000 units+ 500 units /hr - hect 2.5 ug tiw - epo 4000 units IV tiw - sensipar 30 mg po tiw  Assessment/Plan: 1.PAD:S/p L BKA on 11/3 by Dr. Sharol Given. Now in CIR  2. ESRD: HD TTS. Next HD 11/19/2019. No heparin.  3.HTN/volume:No edema on exam. BP well controlled. HD 11/06 net UF 2.5 Liters. Post wt 79.5. Lower EDW on discharge.  4. Anemia:Hgb 10.4 11/07.  No ESA indicated at this time. 5. Secondary hyperparathyroidism:Calcium and phosphorus controlled. Continue hectorol, sensipar, and binder 6. Nutrition:Albumin low. Added protein supps/renal vits. Renal/Carb mod diet  Lynnda Child PA-C [facility] 11/18/2019,10:17 AM

## 2019-11-18 NOTE — Progress Notes (Signed)
[facility] PHYSICAL MEDICINE & REHABILITATION PROGRESS NOTE   Subjective/Complaints:  L knee pain finally doing better with ice and pain meds q4 hours- keeping "ahead" of pain.  LBM x4 yesterday- wants to decrease meds.   Very oliguric.  Most of pain L medial knee- to get VAC off Wednesday.   BP also low- want to decrease meds.   ROS:  Pt denies SOB, abd pain, CP, N/V/C/D, and vision changes   Objective:   No results found. Recent Labs    11/16/19 0846  WBC 8.9  HGB 10.4*  HCT 33.4*  PLT 144*   Recent Labs    11/16/19 0847  NA 137  K 5.4*  CL 98  CO2 27  GLUCOSE 116*  BUN 54*  CREATININE 11.85*  CALCIUM 8.8*    Intake/Output Summary (Last 24 hours) at 11/18/2019 1703 Last data filed at 11/18/2019 0800 Gross per 24 hour  Intake 322 ml  Output 0 ml  Net 322 ml     Pressure Injury 09/03/19 Sacrum Medial Stage 2 -  Partial thickness loss of dermis presenting as a shallow open injury with a red, pink wound bed without slough. (Active)  09/03/19 1400  Location: Sacrum  Location Orientation: Medial  Staging: Stage 2 -  Partial thickness loss of dermis presenting as a shallow open injury with a red, pink wound bed without slough.  Wound Description (Comments):   Present on Admission: Yes     Pressure Injury 11/16/19 Buttocks Left Stage 2 -  Partial thickness loss of dermis presenting as a shallow open injury with a red, pink wound bed without slough. dry shiny nonblanchable area what appears to be a healing stage 2 (Active)  11/16/19 1435  Location: Buttocks  Location Orientation: Left  Staging: Stage 2 -  Partial thickness loss of dermis presenting as a shallow open injury with a red, pink wound bed without slough.  Wound Description (Comments): dry shiny nonblanchable area what appears to be a healing stage 2  Present on Admission: Yes     Pressure Injury 11/16/19 Buttocks Right Stage 2 -  Partial thickness loss of dermis presenting as a shallow open injury  with a red, pink wound bed without slough. nonblanchable area what appears to be a healing stage 2 with dry flacky skin covering (Active)  11/16/19 1435  Location: Buttocks  Location Orientation: Right  Staging: Stage 2 -  Partial thickness loss of dermis presenting as a shallow open injury with a red, pink wound bed without slough.  Wound Description (Comments): nonblanchable area what appears to be a healing stage 2 with dry flacky skin covering  Present on Admission: Yes    Physical Exam: Vital Signs Blood pressure (!) 134/54, pulse 66, temperature 98.3 F (36.8 C), temperature source Oral, resp. rate 18, height 4' 9.09" (1.45 m), weight 79.8 kg, SpO2 96 %. General: sitting up in bed- appropriate, Ox3; VAC on L BKA, NAD HEENT: wearing eyeglasses, conjugate gaze  Neck: Supple without JVD or lymphadenopathy Heart: RRR Chest: CTA B/L- no W/R/R- good air movement Abdomen: Soft, NT, ND, (+)BS  Extremities: No clubbing, cyanosis, or edema. Pulses are 2+ Skin: Stage 2 pressure injury to bilateral buttocks and sacrum.  Neuro: Pt is cognitively appropriate with normal insight, memory, and awareness. Cranial nerves 2-12 are intact. Sensory exam is normal. Reflexes are 2+ in all 4's. Fine motor coordination is intact. No tremors. Motor function is grossly 5/5.  Musculoskeletal: Right BKA site well healed- scarred.  L-BKA  with limb guard over wound VAC.  goes right up to point of L medial knee that's so TTP Psych: appropriate  Assessment/Plan: 1. Functional deficits secondary to left BKA which require 3+ hours per day of interdisciplinary therapy in a comprehensive inpatient rehab setting.  Physiatrist is providing close team supervision and 24 hour management of active medical problems listed below.  Physiatrist and rehab team continue to assess barriers to discharge/monitor patient progress toward functional and medical goals  Care Tool:  Bathing    Body parts bathed by patient: Right  arm, Left arm, Chest, Abdomen, Front perineal area, Right upper leg, Left upper leg, Face   Body parts bathed by helper: Buttocks Body parts n/a: Right lower leg, Left lower leg   Bathing assist Assist Level: Minimal Assistance - Patient > 75%     Upper Body Dressing/Undressing Upper body dressing   What is the patient wearing?: Pull over shirt    Upper body assist Assist Level: Set up assist    Lower Body Dressing/Undressing Lower body dressing      What is the patient wearing?: Underwear/pull up     Lower body assist Assist for lower body dressing: Contact Guard/Touching assist     Toileting Toileting    Toileting assist Assist for toileting: Moderate Assistance - Patient 50 - 74%     Transfers Chair/bed transfer  Transfers assist  Chair/bed transfer activity did not occur: Safety/medical concerns  Chair/bed transfer assist level: Moderate Assistance - Patient 50 - 74% (lateral scoot transfer bed>w/c)     Locomotion Ambulation   Ambulation assist   Ambulation activity did not occur: Safety/medical concerns (new L BKA with R prosthesis, decreased strength/balance)          Walk 10 feet activity   Assist  Walk 10 feet activity did not occur: Safety/medical concerns        Walk 50 feet activity   Assist Walk 50 feet with 2 turns activity did not occur: Safety/medical concerns         Walk 150 feet activity   Assist Walk 150 feet activity did not occur: Safety/medical concerns         Walk 10 feet on uneven surface  activity   Assist Walk 10 feet on uneven surfaces activity did not occur: Safety/medical concerns         Wheelchair     Assist Will patient use wheelchair at discharge?: Yes Type of Wheelchair: Manual    Wheelchair assist level: Moderate Assistance - Patient 50 - 74% Max wheelchair distance: 89 ft with supervision, total A otherwise due to decreased activity tolerance    Wheelchair 50 feet with 2 turns  activity    Assist        Assist Level: Supervision/Verbal cueing   Wheelchair 150 feet activity     Assist      Assist Level: Moderate Assistance - Patient 50 - 74%   Blood pressure (!) 134/54, pulse 66, temperature 98.3 F (36.8 C), temperature source Oral, resp. rate 18, height 4' 9.09" (1.45 m), weight 79.8 kg, SpO2 96 %.   Medical Problem List and Plan: 1.  Impaired mobility and ADLs secondary to left BKA             -patient may not shower             -ELOS/Goals: modI 12-16 days  -Initial CIR evaluations today.  2.  Antithrombotics: -DVT/anticoagulation:  Pharmaceutical: Heparin             -  antiplatelet therapy: ASA 81 mg daily 3. Pain Management:  Oxycodone prn managing pain well.              --changed Gabapentin to 100mg  daily and 200mg  HS to better help with sleep at night, patient agreeable  11/8- pain doing better- con't- VAC off Wednesday 4. Mood: LCSW to follow for evaluation and support.              -antipsychotic agents: N/A 5. Neuropsych: This patient is capable of making decisions on his own behalf. 6. Skin/Wound Care: Routine pressure relief measures.              --Wound VAC to stay in place till 11/10  -Stage 2 pressure injury to bilateral buttocks and sacrum- monitor daily and offload q2H.   11/8- VAC off Wednesday 7. Fluids/Electrolytes/Nutrition: Strict I/O.             -- Renal/CM diet with 1200 cc/FR 8. HTN : Monitor BP tid--continue Amlodipine, coreg and Furosemide 9. ESRD: HD - TTS with nephrology following for assist.              --Schedule HD at the end of the day to help with tolerance of therapy.   -Appreciate nephrology following.  10. T2DM with neuropathy/nephropathy: Hgb A1c-6.5 and well controlled.  Reports was on Basaglar 12 units at bedtime with meal coverage/SSI.                            --Lantus decreased to 25 units on 11/05. May need to decrease further as activity increases. CBGs range from 98-195 currently.   11/8-  BGs 62- 173- everything less 120 except x1- will reduce Lantus to 24 units 11.NICM s/p AICD: Monitor for symptoms with increase in mobility.  Continue Lipitor, coreg, ASA, fenofibrate and Lasix  12. Nausea: Has been intermittent. Question due to constipation--will change colace to Senna S and add pepcid for dyspepsia.  13. Overweight (BMI 25.46): Dietary consult ordered for education.  14. HTN with hypotension  11/8- will reduce Norvasc to 5 mg daily. - will start in AM  LOS: 2 days A FACE TO FACE EVALUATION WAS PERFORMED  Anthony Bullock 11/18/2019, 5:03 PM

## 2019-11-18 NOTE — Progress Notes (Addendum)
Patient Details  Name: Anthony Bullock MRN: [id number] Date of Birth: 1960-07-05  Today's Date: 11/18/2019  Hospital Problems: Principal Problem:   Below-knee amputation of left lower extremity (HCC) Active Problems:   Pressure injury of back, stage 2 [facility])  Past Medical History:  Past Medical History:  Diagnosis Date  . 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 Allergic rhinitis   . Anemia   . Arthritis   . Chronic systolic heart failure (HCC)    a. ECHO (12/2012) EF 25-30%, HK entireanteroseptal myocardium //  b.  EF 25%, diffuse HK, grade 1 diastolic dysfunction, MAC, mild LAE, normal RVSF, trivial pericardial effusion  . COPD (chronic obstructive pulmonary disease) (HCC)   . Diabetes mellitus type II   . Diabetic nephropathy (HCC)   . Diabetic neuropathy (HCC)   . ESRD on hemodialysis [facility])    started HD June 2017, goes to [facility] HD unit, Dr Fausto Skillern  . History of cardiac catheterization    a.Myoview 1/15:  There is significant left ventricular dysfunction. There may be slight scar at the apex. There is no significant ischemia. LV Ejection Fraction: 27%  //  b. RHC/LHC (1/15) with mean RA 6, PA 47/22 mean 33, mean PCWP 20, PVR 2.5 WU, CI 2.5; 80% dLAD stenosis, 70% diffuse large D.   . History of kidney stones   . Hyperlipidemia   . Hypertension   . Kidney stones   . NICM (nonischemic cardiomyopathy) (HCC)    Primarily nonischemic. Echo (12/14) with EF 25-30%. Echo (3/15) with EF 25%, mild to moderately dilated LV, normal RV size and systolic function.   . Osteomyelitis (HCC)    left fifth ray  . Pneumonia   . Urethral stricture   . Wears glasses    Past Surgical History:  Past Surgical History:  Procedure Laterality Date  . ABDOMINAL AORTOGRAM W/LOWER EXTREMITY N/A 03/30/2016   Procedure: Abdominal Aortogram w/Lower Extremity;  Surgeon: Chuck Hint, MD;  Location: [facility] INVASIVE CV LAB;  Service: Cardiovascular;   Laterality: N/A;  . AMPUTATION Right 04/26/2016   Procedure: Right Below Knee Amputation;  Surgeon: Nadara Mustard, MD;  Location: [facility] OR;  Service: Orthopedics;  Laterality: Right;  . AMPUTATION Left 08/21/2019   Procedure: LEFT FOOT 5TH RAY AMPUTATION;  Surgeon: Nadara Mustard, MD;  Location: [facility] OR;  Service: Orthopedics;  Laterality: Left;  . AMPUTATION Left 11/13/2019   Procedure: LEFT BELOW KNEE AMPUTATION;  Surgeon: Nadara Mustard, MD;  Location: [facility] OR;  Service: Orthopedics;  Laterality: Left;  . AV FISTULA PLACEMENT Right 09/08/2015   Procedure: INSERTION OF 4-68mm x 45cm  ARTERIOVENOUS (AV) GORE-TEX GRAFT RIGHT UPPER  ARM;  Surgeon: Chuck Hint, MD;  Location: [facility] OR;  Service: Vascular;  Laterality: Right;  . AV FISTULA PLACEMENT Left 01/14/2016   Procedure: CREATION OF LEFT UPPER ARM ARTERIOVENOUS FISTULA;  Surgeon: Chuck Hint, MD;  Location: [facility] OR;  Service: Vascular;  Laterality: Left;  . BASCILIC VEIN TRANSPOSITION Right 08/22/2014   Procedure: RIGHT UPPER ARM BASCILIC VEIN TRANSPOSITION;  Surgeon: Chuck Hint, MD;  Location: [facility] OR;  Service: Vascular;  Laterality: Right;  . BELOW KNEE LEG AMPUTATION Right 04/26/2016  . CARDIAC CATHETERIZATION    . CARDIAC DEFIBRILLATOR PLACEMENT  06/27/2013   Sub Q       BY DR Graciela Husbands  . CATARACT EXTRACTION W/PHACO Right 08/06/2018   Procedure: CATARACT EXTRACTION PHACO AND INTRAOCULAR LENS PLACEMENT (IOC);  Surgeon: Fabio Pierce,  MD;  Location: [facility] ORS;  Service: Ophthalmology;  Laterality: Right;  CDE: 4.06  . CATARACT EXTRACTION W/PHACO Left 08/20/2018   Procedure: CATARACT EXTRACTION PHACO AND INTRAOCULAR LENS PLACEMENT (IOC);  Surgeon: Fabio Pierce, MD;  Location: [facility] ORS;  Service: Ophthalmology;  Laterality: Left;  CDE: 6.76  . COLONOSCOPY WITH PROPOFOL N/A 07/22/2015   Procedure: COLONOSCOPY WITH PROPOFOL;  Surgeon: Sherrilyn Rist, MD;  Location: [facility] ENDOSCOPY;  Service: Gastroenterology;  Laterality: N/A;  .  FEMORAL-POPLITEAL BYPASS GRAFT Right 03/31/2016   Procedure: BYPASS GRAFT FEMORAL-POPLITEAL ARTERY USING RIGHT GREATER SAPHENOUS NONREVERSED VEIN;  Surgeon: Chuck Hint, MD;  Location: [facility] OR;  Service: Vascular;  Laterality: Right;  . HERNIA REPAIR    . I & D EXTREMITY Right 03/31/2016   Procedure: IRRIGATION AND DEBRIDEMENT FOOT;  Surgeon: Chuck Hint, MD;  Location: [facility] OR;  Service: Vascular;  Laterality: Right;  . IMPLANTABLE CARDIOVERTER DEFIBRILLATOR IMPLANT N/A 06/27/2013   Procedure: SUB Q ICD;  Surgeon: Duke Salvia, MD;  Location: [facility] CATH LAB;  Service: Cardiovascular;  Laterality: N/A;  . INTRAOPERATIVE ARTERIOGRAM Right 03/31/2016   Procedure: INTRA OPERATIVE ARTERIOGRAM;  Surgeon: Chuck Hint, MD;  Location: [facility] OR;  Service: Vascular;  Laterality: Right;  . IR GENERIC HISTORICAL Right 11/30/2015   IR THROMBECTOMY AV FISTULA W/THROMBOLYSIS/PTA INC/SHUNT/IMG RIGHT 11/30/2015 Irish Lack, MD [facility]-INTER[facility] RAD  . IR GENERIC HISTORICAL  11/30/2015   IR US GUIDE VASC ACCESS RIGHT 11/30/2015 Irish Lack, MD [facility]  . IR GENERIC HISTORICAL Right 12/15/2015   IR THROMBECTOMY AV FISTULA W/THROMBOLYSIS/PTA/STENT INC/SHUNT/IMG RT 12/15/2015 Oley Balm, MD [facility]  . IR GENERIC HISTORICAL  12/15/2015   IR US GUIDE VASC ACCESS RIGHT 12/15/2015 Oley Balm, MD [facility]  . IR GENERIC HISTORICAL  12/28/2015   IR FLUORO GUIDE CV LINE RIGHT 12/28/2015 Jolaine Click, MD [facility]  . IR GENERIC HISTORICAL  12/28/2015   IR US GUIDE VASC ACCESS RIGHT 12/28/2015 Jolaine Click, MD [facility]  . LEFT A ND RIGHT HEART CATH  01/30/2013   DR Jones Broom  . LEFT AND RIGHT HEART CATHETERIZATION WITH CORONARY ANGIOGRAM N/A 01/30/2013   Procedure: LEFT AND RIGHT HEART CATHETERIZATION WITH CORONARY ANGIOGRAM;  Surgeon: Dolores Patty, MD;  Location: [facility] CATH LAB;  Service: Cardiovascular;  Laterality: N/A;  . PERIPHERAL VASCULAR CATHETERIZATION Right 01/26/2015    Procedure: A/V Fistulagram;  Surgeon: Chuck Hint, MD;  Location: [facility] INVASIVE CV LAB;  Service: Cardiovascular;  Laterality: Right;  . reapea urethral surgery for recurrent obstruction  2011  . TOTAL KNEE ARTHROPLASTY Right 2007  . VEIN HARVEST Right 03/31/2016   Procedure: RIGHT GREATER SAPHENOUS VEIN HARVEST;  Surgeon: Chuck Hint, MD;  Location: [facility] OR;  Service: Vascular;  Laterality: Right;   Social History:  reports that he quit smoking about 9 years ago. His smoking use included cigarettes. He has a 64.00 pack-year smoking history. He has never used smokeless tobacco. He reports that he does not drink alcohol and does not use drugs.  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 Education: high school Read: Yes Write: Yes Employment Status: Disabled Date Retired/Disabled/Unemployed: SSDI-2016 Marine scientist  Issues: Denies Guardian/Conservator: N/A   Abuse/Neglect Abuse/Neglect Assessment Can Be Completed: Yes Physical Abuse: Denies Verbal Abuse: Denies Sexual Abuse: Denies Exploitation of patient/patient's resources: Denies Self-Neglect: Denies  Emotional Status Pt's affect, behavior and adjustment status: Pt in good spirits at time of visit Recent Psychosocial Issues: Denies Psychiatric History: Denies Substance Abuse History: Denies; quit cigarettes  10 years ago  Patient / Family Perceptions, Expectations & Goals Pt/Family understanding of illness & functional limitations: Pt and wife have a  general understanding of care needs Premorbid pt/family roles/activities: Independent Anticipated changes in roles/activities/participation: Assistance with ADLs/IADLs  Recruitment consultant Agencies: None Premorbid Home Care/DME Agencies: None Transportation available at discharge: wife Resource referrals recommended: Neuropsychology  Discharge Planning Living Arrangements: Spouse/significant other Support Systems: Spouse/significant other Type of Residence: Private residence Insurance Resources: [employer], Media planner (specify) Herbalist Supplement) Financial Resources: SSD Financial Screen Referred: No Living Expenses: Banker Management: Spouse Does the patient have any problems obtaining your medications?: No Home Management: Wife managed meal prep and housekeeping needs Patient/Family Preliminary Plans: no changes Care Coordinator Barriers to Discharge: Decreased caregiver support, Lack of/limited family support Care Coordinator Anticipated Follow Up Needs: HH/OP  Clinical Impression SW met with pt and pt wife in room to introduce self, explain role, and discuss discharge process. Pt is not a Cytogeneticist. No HCPOA, however would like to establish. DME: w/c, RW, BSC, slide board, canes, and ramped entrance. Wife intends to see who can help with getting him into the home as she has bad knees and their ramp is at steep entrance. Pt att[facility] [place]; TTS.   SW informed pt assigned RN on chaplain services request for advanced care directive.   Dyon Rotert A Alvie Speltz 11/18/2019, 2:36 PM

## 2019-11-18 NOTE — Care Management (Signed)
[place] Individual Statement of Services  Patient Name:  Anthony Bullock  Date:  11/18/2019  Welcome to the [facility].  Our goal is to provide you with an individualized program based on your diagnosis and situation, designed to meet your specific needs.  With this comprehensive rehabilitation program, you will be expected to participate in at least 3 hours of rehabilitation therapies Monday-Friday, with modified therapy programming on the weekends.  Your rehabilitation program will include the following services:  Physical Therapy (PT), Occupational Therapy (OT), 24 hour per day rehabilitation nursing, Therapeutic Recreaction (TR), Psychology, Neuropsychology, Care Coordinator, Rehabilitation Medicine, Nutrition Services, Pharmacy Services and Other  Weekly team conferences will be held on Tuesdays to discuss your progress.  Your Inpatient Rehabilitation Care Coordinator will talk with you frequently to get your input and to update you on team discussions.  Team conferences with you and your family in attendance may also be held.  Expected length of stay: 10-14 days   Overall anticipated outcome: Supervision  Depending on your progress and recovery, your program may change. Your Inpatient Rehabilitation Care Coordinator will coordinate services and will keep you informed of any changes. Your Inpatient Rehabilitation Care Coordinator's name and contact numbers are listed  below.  The following services may also be recommended but are not provided by the [facility] will be made to provide these services after discharge if needed.  Arrangements include referral to agencies that provide these services.  Your insurance has been verified to be:  Medicare A/B  Your primary doctor is:  Clinical biochemist  Pertinent information will be shared with your doctor and your insurance company.  Inpatient Rehabilitation Care Coordinator:  Cathleen Corti [phone] or ([phone]  Information discussed with and copy given to patient by: Rana Snare, 11/18/2019, 11:37 AM

## 2019-11-18 NOTE — Progress Notes (Signed)
Occupational Therapy Session Note  Patient Details  Name: AHNAF CAPONI MRN: [id number] Date of Birth: 1960/04/08  Today's Date: 11/18/2019 OT Individual Time: 1045-1130 OT Individual Time Calculation (min): 45 min  and Today's Date: 11/18/2019 OT Missed Time: 30 Minutes Missed Time Reason: Pain   Short Term Goals: Week 1:  OT Short Term Goal 1 (Week 1): Pt will complete 3/3 components of toileting with Min A OT Short Term Goal 2 (Week 1): Pt will be able to independently state 2 pressure relief strategies to implement when sitting OOB OT Short Term Goal 3 (Week 1): Pt will complete 2 grooming tasks while sitting at the sink to increase OOB tolerance  Skilled Therapeutic Interventions/Progress Updates:    Pt resting in w/c upon arrival with wife present.  Pt declined donning clothing stating he was happy with hospital gown. BP sitting in w/c 102/52 with no reports of dizzines. Pt stated he has been having some nausea. Pt propelled w/c approx 75' before requesting assistance. Pt with difficulty maintaining straight path. Pt transitioned to [employer] for BUE therex on Arm Bike. Pt able to tolerate approx 3 mins on SciFit before c/o increased pain in LLE and requested to return to room. Squat pivot to bed with min A. Limb guard removed and pt reported relief from pain. Ice pack applied. Pt's wife present. Bed alarm activated. Pt missed 30 mins skilled OT services 2/2 pain.   Therapy Documentation Precautions:  Precautions Precautions: Fall, Other (comment) Precaution Comments: L BKA wound vac and limb guard; h/o R BKA (prosthetic in room) Restrictions Weight Bearing Restrictions: Yes LLE Weight Bearing: Weight bearing as tolerated Other Position/Activity Restrictions: NWB L LE  Pain: Pt c/o 3/10 pain in LLE (knee and surgical site) at beginning of session; pain increased to 10/10 pain the longer pt remained in w/c. Pt returned to bed and pt reported relief; ice applied  Therapy/Group:  Individual Therapy  Leroy Libman 11/18/2019, 11:44 AM

## 2019-11-18 NOTE — Progress Notes (Signed)
Physical Therapy Session Note  Patient Details  Name: Anthony Bullock MRN: [id number] Date of Birth: 01-24-1960  Today's Date: 11/18/2019 PT Individual Time: 0900-1000; 1430-1530 PT Individual Time Calculation (min): 60 min and 60 min  Short Term Goals: Week 1:  PT Short Term Goal 1 (Week 1): Patient will perform basic transfers with CGA. PT Short Term Goal 2 (Week 1): [name] will perform sit to/from stand with min A using LRAD. PT Short Term Goal 3 (Week 1): Patient will demonstrate dynamic sitting balance with supervision >5 min.  Skilled Therapeutic Interventions/Progress Updates:    Session 1: Pt received semi-reclined in bed, agreeable to PT session. Pt reports pain in L residual limb at rest, not rated. Utilized ice pack to L knee at end of session for pain management. Semi-reclined to sitting EOB with mod A for trunk control. Pt exhibits fair sitting balance and falls posteriorly and laterally to the R without assist to maintain upright posture. A/P transfer to w/c with mod A for trunk control. Once seated in w/c with back support pt able to don RLE prosthetic with setup A. Manual w/c propulsion 2 x 100 ft with use of BUE at Supervision level before onset of fatigue. Provided pt with w/c gloves for improved grip with w/c mobility. Squat pivot transfer w/c to/from mat table with min A. Seated balance EOM with close SBA with use of R prosthetic on the floor for improved stability. Seated L/R lateral leans x 10 reps each direction with CGA to return to midline. Seated balance exercise playing "zoom ball" with SBA for sitting balance x 2 min before pt reports onset of fatigue and UB soreness. Pt returned to w/c similar manner as above. Pt agreeable to stay seated in w/c at end of session, needs in reach, quick release belt and chair alarm in place, wife present at end of session.  Session 2: Pt received semi-reclined in bed, agreeable to PT session. Pt reports 6/10 pain in L residual limb,  pre-medicated prior to start of therapy session. Assisted pt with donning L limb guard and R limb prosthetic. Education with patient about residual limb positioning to prevent contracture. Semi-reclined to sitting EOB with CGA. Squat pivot transfer to w/c with min A. Manual w/c propulsion x 100 ft with use of BUE at Supervision level before onset of fatigue. Reviewed management of w/c parts including leg rests, arm rests, and brakes. Squat pivot transfer w/c to/from mat table with min A. Sit to supine CGA for balance. Supine BLE strengthening therex: SLR, hip abd, SAQ, modified bridges 2 x 10 reps each. Sidelying RLE hip abd x 10 reps. Prone HS curls and hip ext 2 x 10 reps each. Pt returned to sitting EOM with CGA. Seated BLE LAQ x 10 reps each. Squat pivot transfer back to w/c then back to bed with min A. Pt returned to supine in bed with Supervision. Pt left semi-reclined in bed with needs in reach, bed alarm in place, ice pack to L knee for pain management at end of session.  Therapy Documentation Precautions:  Precautions Precautions: Fall, Other (comment) Precaution Comments: L BKA wound vac and limb guard; h/o R BKA (prosthetic in room) Restrictions Weight Bearing Restrictions: Yes LLE Weight Bearing: Weight bearing as tolerated Other Position/Activity Restrictions: NWB L LE   Therapy/Group: Individual Therapy   Excell Seltzer, PT, DPT  11/18/2019, 5:19 PM

## 2019-11-18 NOTE — Progress Notes (Signed)
Patient information reviewed and entered into eRehab System by Becky Jermine Bibbee, PPS coordinator. Information including medical coding, function ability, and quality indicators will be reviewed and updated through discharge.   

## 2019-11-18 NOTE — Progress Notes (Signed)
PMR Admission Coordinator Pre-Admission Assessment   Patient: Anthony Bullock is an 60 y.o., male MRN: [id number] DOB: 1960/09/24 Height: 5' 10" (177.8 cm) Weight: 82.1 kg   Insurance Information HMO:     PPO:      PCP:      IPA:      80/20:      OTHER:  PRIMARY: Medicare A and B      Policy#: [id number]      Subscriber: pt CM Name:       Phone#:      Fax#:  Pre-Cert#: verified Office manager:  Benefits:  Phone #:      Name:  Eff. Date: 03/10/17 A and B     Deduct: $1484      Out of Pocket Max: n/a      Life Max: n/a CIR: 100%      SNF: 20 full days Outpatient: 80%     Co-Pay: 20% Home Health: 100%      Co-Pay:  DME: 80%     Co-Pay: 20% Providers: pt choice SECONDARY: BCBS Supplement      Policy#: [id number]     Phone#: [phone]   Financial Counselor:       Phone#:    The "Data Collection Information Summary" for patients in Inpatient Rehabilitation Facilities with attached "Privacy Act [place] Records" was provided and verbally reviewed with: Patient and Family   Emergency Contact Information         Contact Information     Name Relation Home Work Mobile    [place] Spouse [phone]   [phone]         Current Medical History  Patient Admitting Diagnosis: L BKA   History of Present Illness: Pt is a 59 y/o male with PMH of DM with neuropathy and nephropathy, ESRD (HD TRS), NICM s/p AICD, and R BKA (he has a prosthesis for this).  He was admitted 11/13/19 for below-knee amputation per Dr. Sharol Given for management of non-healing wound of L foot osteomyelitis.  Post-op course pain management.  Therapy evaluations were completed and pt was recommended for CIR.     Patient's medical record from [facility] has been reviewed by the rehabilitation admission coordinator and physician.   Past Medical History      Past Medical History:  Diagnosis Date  . AICD (automatic cardioverter/defibrillator) present      [employer]  . Allergic  rhinitis    . Anemia    . Arthritis    . Chronic systolic heart failure ([place])      a. ECHO (12/2012) EF 25-30%, HK entireanteroseptal myocardium //  b.  EF 25%, diffuse HK, grade 1 diastolic dysfunction, MAC, mild LAE, normal RVSF, trivial pericardial effusion  . COPD (chronic obstructive pulmonary disease) ([place])    . Diabetes mellitus type II    . Diabetic nephropathy ([place])    . Diabetic neuropathy ([place])    . ESRD on hemodialysis [facility])      started HD June 2017, goes to [facility] HD unit, Dr Hinda Lenis  . History of cardiac catheterization      a.Myoview 1/15:  There is significant left ventricular dysfunction. There may be slight scar at the apex. There is no significant ischemia. LV Ejection Fraction: 27%  //  b. RHC/LHC (1/15) with mean RA 6, PA 47/22 mean 33, mean PCWP 20, PVR 2.5 WU, CI 2.5; 80% dLAD stenosis, 70% diffuse large D.    [name]  History of kidney stones    . Hyperlipidemia    . Hypertension    . Kidney stones    . NICM (nonischemic cardiomyopathy) ([place])      Primarily nonischemic.  Echo (12/14) with EF 25-30%.  Echo (3/15) with EF 25%, mild to moderately dilated LV, normal RV size and systolic function.    . Osteomyelitis ([place])      left fifth ray  . Pneumonia    . Urethral stricture    . Wears glasses        Family History   family history includes Alcohol abuse in his father; Bladder Cancer in his mother; Diabetes in his maternal grandmother; Heart Problems in his maternal grandmother; Heart disease in his maternal grandfather; Melanoma in his father; Prostate cancer in his maternal grandfather; Stroke in his maternal grandmother.   Prior Rehab/Hospitalizations Has the patient had prior rehab or hospitalizations prior to admission? Yes   Has the patient had major surgery during 100 days prior to admission? Yes              Current Medications   Current Facility-Administered Medications:  .  acetaminophen (TYLENOL) tablet 325-650 mg, 325-650 mg, Oral, Q6H PRN,  Persons, Bevely Palmer, PA, 650 mg at 11/14/19 1332 .  albuterol (VENTOLIN HFA) 108 (90 Base) MCG/ACT inhaler 2 puff, 2 puff, Inhalation, Q4H PRN, Persons, Bevely Palmer, PA .  amLODipine (NORVASC) tablet 10 mg, 10 mg, Oral, Daily, Persons, Bevely Palmer, [place], 10 mg at 11/15/19 0909 .  aspirin EC tablet 81 mg, 81 mg, Oral, Daily, Persons, Bevely Palmer, [place], 81 mg at 11/15/19 0908 .  atorvastatin (LIPITOR) tablet 80 mg, 80 mg, Oral, QHS, Persons, Bevely Palmer, [place], 80 mg at 11/14/19 2211 .  azelastine (ASTELIN) 0.1 % nasal spray 2 spray, 2 spray, Each Nare, TID PRN, Persons, Bevely Palmer, PA .  carvedilol (COREG) tablet 25 mg, 25 mg, Oral, BID WC, Persons, Bevely Palmer, [place], 25 mg at 11/15/19 0909 .  Chlorhexidine Gluconate Cloth 2 % PADS 6 each, 6 each, Topical, Q0600, Roney Jaffe, MD .  cinacalcet (SENSIPAR) tablet 30 mg, 30 mg, Oral, Q M,W,F-HD, Roney Jaffe, MD .  docusate sodium (COLACE) capsule 100 mg, 100 mg, Oral, BID, Persons, Bevely Palmer, [place], 100 mg at 11/14/19 2211 .  doxercalciferol (HECTOROL) injection 2.5 mcg, 2.5 mcg, Intravenous, Q M,W,F-HD, Roney Jaffe, MD .  fenofibrate tablet 160 mg, 160 mg, Oral, Daily, Persons, Bevely Palmer, [place], 160 mg at 11/15/19 0911 .  furosemide (LASIX) tablet 120 mg, 120 mg, Oral, Daily, Persons, Bevely Palmer, PA, 120 mg at 11/15/19 0909 .  gabapentin (NEURONTIN) capsule 600 mg, 600 mg, Oral, BID, Persons, Bevely Palmer, PA, 600 mg at 11/15/19 0909 .  heparin sodium (porcine) 1000 UNIT/ML injection, , , ,  .  HYDROmorphone (DILAUDID) injection 0.5 mg, 0.5 mg, Intravenous, Q4H PRN, Persons, Bevely Palmer, PA, 0.5 mg at 11/15/19 0425 .  insulin aspart (novoLOG) injection 0-5 Units, 0-5 Units, Subcutaneous, QHS, Persons, Bevely Palmer, [place], 2 Units at 11/13/19 2349 .  insulin aspart (novoLOG) injection 0-9 Units, 0-9 Units, Subcutaneous, TID WC, Persons, Bevely Palmer, [place], 1 Units at 11/14/19 1718 .  insulin aspart (novoLOG) injection 4 Units, 4 Units, Subcutaneous, TID WC, Persons, Bevely Palmer, [place], 4  Units at 11/14/19 1719 .  insulin glargine (LANTUS) injection 25 Units, 25 Units, Subcutaneous, QHS, Newt Minion, MD .  lanthanum [facility]) chewable tablet 2,000 mg, 2,000 mg, Oral, TID with meals, Persons, Bevely Palmer, PA, 2,000  mg at 11/15/19 1237 .  lanthanum (FOSRENOL) chewable tablet 500 mg, 500 mg, Oral, With snacks, Blenda Nicely, RPH, 500 mg at 11/15/19 0910 .  loratadine (CLARITIN) tablet 10 mg, 10 mg, Oral, Daily, Persons, Bevely Palmer, PA, 10 mg at 11/15/19 0910 .  metoCLOPramide (REGLAN) tablet 5-10 mg, 5-10 mg, Oral, Q8H PRN **OR** metoCLOPramide (REGLAN) injection 5-10 mg, 5-10 mg, Intravenous, Q8H PRN, Persons, Bevely Palmer, PA .  ondansetron [facility]) tablet 4 mg, 4 mg, Oral, Q6H PRN, 4 mg at 11/14/19 1718 **OR** ondansetron (ZOFRAN) injection 4 mg, 4 mg, Intravenous, Q6H PRN, Persons, Bevely Palmer, PA .  oxyCODONE (Oxy IR/ROXICODONE) immediate release tablet 10-15 mg, 10-15 mg, Oral, Q4H PRN, Persons, Bevely Palmer, PA, 15 mg at 11/15/19 1310   Patients Current Diet:     Diet Order                      Diet renal/carb modified with fluid restriction Diet-HS Snack? Nothing; Fluid restriction: 1200 mL Fluid; Room service appropriate? Yes; Fluid consistency: Thin  Diet effective now              Diet - low sodium heart healthy                      Precautions / Restrictions Precautions Precautions: Fall, Other (comment) Precaution Comments: L BKA wound vac and limb guard; h/o R BKA (prosthetic in room) Restrictions Weight Bearing Restrictions: Yes LLE Weight Bearing: Non weight bearing Other Position/Activity Restrictions: NWB L LE    Has the patient had 2 or more falls or a fall with injury in the past year? No   Prior Activity Level Limited Community (1-2x/wk): w/c or RW for household mobility, wife pushed w/c for community distances, not driving, does have a R BKA prosthetic   Prior Functional Level Self Care: Did the patient need help bathing, dressing, using the toilet  or eating? Independent   Indoor Mobility: Did the patient need assistance with walking from room to room (with or without device)? Independent   Stairs: Did the patient need assistance with internal or external stairs (with or without device)? Dependent   Functional Cognition: Did the patient need help planning regular tasks such as shopping or remembering to take medications? [place] / Equipment Home Assistive Devices/Equipment: Environmental consultant (specify type), Eyeglasses, CBG Meter, Blood pressure cuff, Prosthesis Home Equipment: Walker - 2 wheels, Wheelchair - manual, Bedside commode, Other (comment) (slide board)   Prior Device Use: Indicate devices/aids used by the patient prior to current illness, exacerbation or injury? Manual wheelchair, Walker and Orthotics/Prosthetics   Current Functional Level Cognition   Overall Cognitive Status: Impaired/Different from baseline Current Attention Level: Selective Orientation Level: Oriented X4 General Comments: WFL for majority of tasks, although easily distracted and moving fast requiring cues to stay on task    Extremity Assessment (includes Sensation/Coordination)   Upper Extremity Assessment: Generalized weakness (difficulty with fine motor control, tremulous hand/finger movements)  Lower Extremity Assessment: RLE deficits/detail, LLE deficits/detail RLE Deficits / Details: s/p BKA in 2018; hip and knee >3/5 LLE Deficits / Details: s/p new BKA; hip flex/ext/abd/add at least 3/5, knee flex/ext ~3/5 although tremulous with movement and limited by wound vac     ADLs   Overall ADL's : Needs assistance/impaired Eating/Feeding: Set up, Sitting Grooming: Sitting, Min guard Upper Body Bathing: Minimal assistance, Sitting Lower Body Bathing: Maximal assistance, Sitting/lateral leans Upper Body Dressing : Minimal assistance, Sitting  Upper Body Dressing Details (indicate cue type and reason): Min A to doff/don new  gown Lower Body Dressing: Moderate assistance, Sitting/lateral leans Lower Body Dressing Details (indicate cue type and reason): Pt overall min guard for donning R prosthetic sitting EOB to maintain balance. Pt will require increased assist for donning pants/underwear but will do better with lateral leans. Standing for ADLs unsafe at this time Toileting- Clothing Manipulation and Hygiene: Maximal assistance, Sitting/lateral lean, Bed level General ADL Comments: Improved pain control, still limited by weakness and decreased balance     Mobility   Overal bed mobility: Needs Assistance Bed Mobility: Supine to Sit Supine to sit: Mod assist, HOB elevated Sit to supine: Mod assist General bed mobility comments: Received sitting in recliner; heavy reliance on BUE support to translate weight anteriorly in chair     Transfers   Overall transfer level: Needs assistance Equipment used: 2 person hand held assist, Rolling walker (2 wheeled), None Transfers: Sit to/from Stand, Radiographer, therapeutic Sit to Stand: Max assist, +2 physical assistance, +2 safety/equipment Anterior-Posterior transfers: Min assist General transfer comment: Initially attempted sit to stand transfers in prep for pivot to chair; initial attempt with maxA+2 and bilat HHA but pt unable to maintain balance; additional attempt with RW, still requiring maxA+2 to maintain standing balance with posterior lean. Opted for AP transfer from bed <> recliner with minA to assist scooting with bed pad     Ambulation / Gait / Stairs / Wheelchair Mobility         Posture / Balance Dynamic Sitting Balance Sitting balance - Comments: Difficulty maintaining balance in recliner without back support when legs elevated to don/doff limb guard, reliant on UE support at times Balance Overall balance assessment: Needs assistance Sitting-balance support: Bilateral upper extremity supported, Feet supported Sitting balance-Leahy Scale: Fair Sitting  balance - Comments: Difficulty maintaining balance in recliner without back support when legs elevated to don/doff limb guard, reliant on UE support at times Postural control: Posterior lean Standing balance support: Bilateral upper extremity supported Standing balance-Leahy Scale: Zero Standing balance comment: Max A x2 for standing balance with RW and R prosthetic, heavy posterior lean     Special needs/care consideration Skin L BKA incision, Diabetic management yes and Designated visitor [place] (from acute therapy documentation) Living Arrangements: Spouse/significant other Available Help at Discharge: Family, Available PRN/intermittently Type of Home: House Home Layout: One level Home Access: Ramped entrance (steep ramp) Bathroom Toilet: Handicapped height Bathroom Accessibility: No Home Care Services: No Additional Comments: Wheelchair will not fit in bathroom/bedroom doorways. Wife works full time, but reports able to work from home as needed. Have some church members that can assist as needed   Discharge Living Setting Plans for Discharge Living Setting: Patient's home Type of Home at Discharge: House Discharge Home Layout: One level Discharge Home Access: Ramped entrance (steep ramp or 4 SE) Discharge Bathroom Shower/Tub:  (pt sponge bathes at baseline) Discharge Bathroom Toilet: Handicapped height Discharge Bathroom Accessibility: No Does the patient have any problems obtaining your medications?: No   Social/Family/Support Systems Patient Roles: Spouse Anticipated Caregiver: wife, Shahab Polhamus Anticipated Caregiver's Contact Information: [phone] Ability/Limitations of Caregiver: works from home, can provide only supervision to min guard Caregiver Availability: 24/7 Discharge Plan Discussed with Primary Caregiver: Yes Is Caregiver In Agreement with Plan?: Yes Does Caregiver/Family have Issues with Lodging/Transportation while Pt is  in Rehab?: No   Goals Patient/Family Goal for Rehab: PT/OT supervision to mod I, w/c level Expected  length of stay: 10-12 days Additional Information: HD TRS, old R BKA, has a prosthetic Pt/Family Agrees to Admission and willing to participate: Yes Program Orientation Provided & Reviewed with Pt/Caregiver Including Roles  & Responsibilities: Yes   Decrease burden of Care through IP rehab admission: n/a   Possible need for SNF placement upon discharge: Not anticipated   Patient Condition: I have reviewed medical records from [facility], spoken with CM, and patient and spouse. I met with patient at the bedside for inpatient rehabilitation assessment.  Patient will benefit from ongoing PT and OT, can actively participate in 3 hours of therapy a day 5 days of the week, and can make measurable gains during the admission.  Patient will also benefit from the coordinated team approach during an Inpatient Acute Rehabilitation admission.  The patient will receive intensive therapy as well as Rehabilitation physician, nursing, social worker, and care management interventions.  Due to safety, skin/wound care, disease management, medication administration, pain management and patient education the patient requires 24 hour a day rehabilitation nursing.  The patient is currently mod to max with mobility and basic ADLs.  Discharge setting and therapy post discharge at home with home health is anticipated.  Patient has agreed to participate in the Acute Inpatient Rehabilitation Program and will admit today.   Preadmission Screen Completed By:  Michel Santee, PT, DPT 11/15/2019 3:19 PM ______________________________________________________________________   Discussed status with Dr. Ranell Patrick on 11/15/19  at 3:19 PM  and received approval for admission today.   Admission Coordinator:  Michel Santee, PT, DPT time 3:19 PM Sudie Grumbling 11/15/19     Assessment/Plan: Diagnosis: Left BKA 1. Does the need for  close, 24 hr/day Medical supervision in concert with the patient's rehab needs make it unreasonable for this patient to be served in a less intensive setting? Yes 2. Co-Morbidities requiring supervision/potential complications: ESRD on HD, NICM s/p AICD, Type 2 DM, overweight (BMI 26.32), hyperkalemia 3. Due to bladder management, bowel management, safety, skin/wound care, disease management, medication administration, pain management and patient education, does the patient require 24 hr/day rehab nursing? Yes 4. Does the patient require coordinated care of a physician, rehab nurse, PT, OT to address physical and functional deficits in the context of the above medical diagnosis(es)? Yes Addressing deficits in the following areas: balance, endurance, locomotion, strength, transferring, bowel/bladder control, bathing, dressing, feeding, grooming, toileting and psychosocial support 5. Can the patient actively participate in an intensive therapy program of at least 3 hrs of therapy 5 days a week? Yes 6. The potential for patient to make measurable gains while on inpatient rehab is excellent 7. Anticipated functional outcomes upon discharge from inpatient rehab: modified independent PT, modified independent OT, independent SLP 8. Estimated rehab length of stay to reach the above functional goals is: 12-16 days 9. Anticipated discharge destination: Home 10. Overall Rehab/Functional Prognosis: excellent     MD Signature: Leeroy Cha, MD

## 2019-11-18 NOTE — Progress Notes (Signed)
Inpatient Rehabilitation  Patient information reviewed and entered into eRehab system by Rayshun Kandler M. Earlee Herald, M.A., CCC/SLP, PPS Coordinator.  Information including medical coding, functional ability and quality indicators will be reviewed and updated through discharge.    

## 2019-11-18 NOTE — Progress Notes (Signed)
Initial Nutrition Assessment  DOCUMENTATION CODES:   Non-severe (moderate) malnutrition in context of chronic illness  INTERVENTION:   - Continue renal MVI daily  - ProSource Plus 30 ml po BID, each supplement provides 100 kcal and 15 grams of protein  - HS snack daily  - Family bringing in some appropriate meals from home to improve PO intake  - Renal and high protein diet education provided  - Encourage adequate PO intake  NUTRITION DIAGNOSIS:   Moderate Malnutrition related to chronic illness (ESRD on HD, CHF, COPD) as evidenced by moderate fat depletion, moderate muscle depletion.  GOAL:   Patient will meet greater than or equal to 90% of their needs  MONITOR:   PO intake, Supplement acceptance, Labs, Weight trends, Skin, I & O's  REASON FOR ASSESSMENT:   Consult Diet education  ASSESSMENT:   59 year old male with PMH of T2DM with nephropathy/neuropathy, HTN, ESRD on HD, NICM s/p AICD, CHF, COPD, s/p right BKA, left foot ulcer with osteomyelitis s/p toe amputation with poor wound healing and gangrenous changes. Pt was admitted on 11/03 for left BKA. Admitted to CIR on 11/06.   RD consulted for diet education.  Last HD on 11/6 with 2500 ml net UF. Post-HD weight was 80.5 kg.  Spoke with pt and wife at bedside. Pt reports a good appetite. Pt's wife states that pt is a very picky eater and does not like the hospital food. She is bringing in some food from home.  At home, pt typically eats 3 meals daily and a snack.  Breakfast: eggs, bacon Lunch: hamburger (made with egg), fries made with leached potatoes Dinner: stew containing leached potatoes, carrots, beef, chicken, and a small amount of tomato paste Snack: peanut butter sandwich  Pt reports that the RD at his dialysis center has discussed with him the importance of protein and ways to increase protein in his diet. This RD discussed the need for increased protein related to ESRD/HD as well as wound healing.  Pt and wife express understanding. Pt does not like Nepro shakes but is willing to try ProSource Plus to increase protein in his diet. RD will also order pt an HS snack to have during admission.  Pt reports that his UBW prior to recent left BKA is around 200 lbs. Per Nephrology notes, EDW is 83 kg. Suspect this will need to be lowered now that pt is s/p left BKA.  Pt reports that he very closely watches his sodium intake and tries to keep it under 2000 mg daily. Pt counts out all of his food at the beginning of the day to make sure he stays within his goal. Pt has also made changes to help decrease his phosphorus intake like remove the piece of cheese that he had been having on his burger.  Discussed with pt that although it is important to watch sodium, it cannot come at the expense of getting enough calories. Pt's wife expressed concern regarding pt not getting enough calories. Discussed pt's calorie goals with him and his wife.  Meal Completion: 40-85%  Medications reviewed and include: sensipar, hectorol, pepcid, fenofibrate, lasix, SSI, Novolog 2 units TID with meals, lantus 25 units daily, fosrenol TID with meals, fosrenol TID with snacks, rena-vit, senna  Labs reviewed: potassium 5.4, phosphorus 5.2 CBG's: 98-286 x 24 hours  NUTRITION - FOCUSED PHYSICAL EXAM:    Most Recent Value  Orbital Region Moderate depletion  Upper Arm Region Moderate depletion  Thoracic and Lumbar Region Mild depletion  Buccal  Region Mild depletion  Temple Region No depletion  Clavicle Bone Region Moderate depletion  Clavicle and Acromion Bone Region Moderate depletion  Scapular Bone Region Unable to assess  Dorsal Hand Mild depletion  Patellar Region Mild depletion  Anterior Thigh Region Moderate depletion  Posterior Calf Region Unable to assess  Edema (RD Assessment) None  Hair Reviewed  Eyes Reviewed  Mouth Reviewed  Skin Reviewed  Nails Reviewed       Diet Order:   Diet Order             Diet renal/carb modified with fluid restriction Diet-HS Snack? Nothing; Fluid restriction: 1200 mL Fluid; Room service appropriate? Yes; Fluid consistency: Thin  Diet effective now                 EDUCATION NEEDS:   Education needs have been addressed  Skin:  Skin Assessment: Skin Integrity Issues: Stage II: bilateral buttocks Wound VAC: left leg s/p BKA  Last BM:  11/17/19  Height:   Ht Readings from Last 1 Encounters:  11/16/19 4' 9.09" (1.45 m)    Weight:   Wt Readings from Last 1 Encounters:  11/18/19 79.8 kg    BMI:  Body mass index is 37.95 kg/m.  Estimated Nutritional Needs:   Kcal:  2200-2400  Protein:  110-130 grams  Fluid:  1000 ml + UOP    Gaynell Face, MS, RD, LDN Inpatient Clinical Dietitian Please see [facility] for contact information.

## 2019-11-19 ENCOUNTER — Inpatient Hospital Stay (HOSPITAL_COMMUNITY): Payer: Medicare Other

## 2019-11-19 ENCOUNTER — Inpatient Hospital Stay (HOSPITAL_COMMUNITY): Payer: Medicare Other | Admitting: Physical Therapy

## 2019-11-19 ENCOUNTER — Telehealth: Payer: Self-pay

## 2019-11-19 LAB — CBC
HCT: 32.4 % — ABNORMAL LOW (ref 39.0–52.0)
Hemoglobin: 10.1 g/dL — ABNORMAL LOW (ref 13.0–17.0)
MCH: 28.6 pg (ref 26.0–34.0)
MCHC: 31.2 g/dL (ref 30.0–36.0)
MCV: 91.8 fL (ref 80.0–100.0)
Platelets: 159 10*3/uL (ref 150–400)
RBC: 3.53 MIL/uL — ABNORMAL LOW (ref 4.22–5.81)
RDW: 15.9 % — ABNORMAL HIGH (ref 11.5–15.5)
WBC: 6.8 10*3/uL (ref 4.0–10.5)
nRBC: 0 % (ref 0.0–0.2)

## 2019-11-19 LAB — RENAL FUNCTION PANEL
Albumin: 2.8 g/dL — ABNORMAL LOW (ref 3.5–5.0)
Anion gap: 15 (ref 5–15)
BUN: 76 mg/dL — ABNORMAL HIGH (ref 6–20)
CO2: 27 mmol/L (ref 22–32)
Calcium: 8.6 mg/dL — ABNORMAL LOW (ref 8.9–10.3)
Chloride: 92 mmol/L — ABNORMAL LOW (ref 98–111)
Creatinine, Ser: 13.97 mg/dL — ABNORMAL HIGH (ref 0.61–1.24)
GFR, Estimated: 4 mL/min — ABNORMAL LOW (ref >60)
Glucose, Bld: 144 mg/dL — ABNORMAL HIGH (ref 70–99)
Phosphorus: 6.9 mg/dL — ABNORMAL HIGH (ref 2.5–4.6)
Potassium: 6.3 mmol/L (ref 3.5–5.1)
Sodium: 134 mmol/L — ABNORMAL LOW (ref 135–145)

## 2019-11-19 LAB — GLUCOSE, CAPILLARY
Glucose-Capillary: 140 mg/dL — ABNORMAL HIGH (ref 70–99)
Glucose-Capillary: 71 mg/dL (ref 70–99)
Glucose-Capillary: 162 mg/dL — ABNORMAL HIGH (ref 70–99)
Glucose-Capillary: 166 mg/dL — ABNORMAL HIGH (ref 70–99)

## 2019-11-19 MED ORDER — HEPARIN SODIUM (PORCINE) 1000 UNIT/ML DIALYSIS: 1000.0000 [IU] | INTRAMUSCULAR | Status: DC | PRN

## 2019-11-19 MED ORDER — SODIUM CHLORIDE 0.9 % IV SOLN: 100.0000 mL | INTRAVENOUS | Status: DC | PRN

## 2019-11-19 MED ORDER — LIDOCAINE-PRILOCAINE 2.5-2.5 % EX CREA: 1.0000 "application " | TOPICAL_CREAM | CUTANEOUS | Status: DC | PRN

## 2019-11-19 MED ORDER — DOXERCALCIFEROL 4 MCG/2ML IV SOLN
INTRAVENOUS | Status: AC
  Filled 2019-11-19: qty 2

## 2019-11-19 MED ORDER — HEPARIN SODIUM (PORCINE) 1000 UNIT/ML IJ SOLN
INTRAMUSCULAR | Status: AC
  Filled 2019-11-19: qty 2

## 2019-11-19 MED ORDER — PENTAFLUOROPROP-TETRAFLUOROETH EX AERO: 1.0000 "application " | INHALATION_SPRAY | CUTANEOUS | Status: DC | PRN

## 2019-11-19 MED ORDER — LIDOCAINE HCL (PF) 1 % IJ SOLN: 5.0000 mL | INTRAMUSCULAR | Status: DC | PRN

## 2019-11-19 MED ORDER — ALTEPLASE 2 MG IJ SOLR: 2.0000 mg | Freq: Once | INTRAMUSCULAR | Status: DC | PRN

## 2019-11-19 NOTE — Progress Notes (Signed)
Patient ID: Anthony Bullock, male   DOB: 08-09-1960, 59 y.o.   MRN: [id number]  SW met with pt in room to provide updates from team conference, and d/c date 11/19. Pt aware SW to follow-up with his wife. SW informed [place] on pt d/c date. SW left message for pt wife Manuela Schwartz [phone]) to inform on above, and requested follow-up to schedule family education.   Loralee Pacas, MSW, [place] Office: [phone] Cell: [phone] Fax: [phone]

## 2019-11-19 NOTE — Progress Notes (Signed)
Physical Therapy Session Note  Patient Details  Name: Anthony Bullock MRN: [id number] Date of Birth: February 20, 1960  Today's Date: 11/19/2019 PT Individual Time: 3009-2330 PT Individual Time Calculation (min): 47 min  PT Amount of Missed Time (min): 13 Minutes PT Missed Treatment Reason: Patient fatigue  Short Term Goals: Week 1:  PT Short Term Goal 1 (Week 1): Patient will perform basic transfers with CGA. PT Short Term Goal 2 (Week 1): [name] will perform sit to/from stand with min A using LRAD. PT Short Term Goal 3 (Week 1): Patient will demonstrate dynamic sitting balance with supervision >5 min.  Skilled Therapeutic Interventions/Progress Updates:    Pt received seated in w/c in room, agreeable to PT session. Pt reports improvement in LLE pain this date, pain not rated and pt reports being premedicated prior to start of therapy session. Seated BP 109/47 and pt reports ongoing dizziness while in seated position, deferred standing activity this AM due to ongoing low BP and symptoms. Manual w/c propulsion up to 100 ft with use of BUE at Supervision level before onset of fatigue. Reviewed management of w/c parts with pt requiring min cueing for management of parts. Squat pivot transfer w/c to/from mat table with min A throughout session with cues for lifting buttocks during transfer to avoid shearing. Obtained Roho cushion for patient for improved management of skin integrity while seated in w/c throughout the day. Seated balance/endurance task performing ball toss, 2 x 30 reps to fatigue. Pt requests to return back to bed due to fatigue. Squat pivot transfer back to bed with min A. Sit to supine Supervision. Pt left semi-reclined in bed with needs in reach, bed alarm in place. Pt missed 13 min of scheduled therapy session due to fatigue.  Therapy Documentation Precautions:  Precautions Precautions: Fall, Other (comment) Precaution Comments: L BKA wound vac and limb guard; h/o R BKA (prosthetic in  room) Restrictions Weight Bearing Restrictions: Yes LLE Weight Bearing: Non weight bearing Other Position/Activity Restrictions: NWB L LE General: PT Amount of Missed Time (min): 13 Minutes PT Missed Treatment Reason: Patient fatigue    Therapy/Group: Individual Therapy   Excell Seltzer, PT, DPT  11/19/2019, 12:10 PM

## 2019-11-19 NOTE — Progress Notes (Signed)
Occupational Therapy Session Note  Patient Details  Name: Anthony Bullock MRN: [id number] Date of Birth: July 15, 1960  Today's Date: 11/19/2019 OT Individual Time: 1130-1155 OT Individual Time Calculation (min): 25 min    Short Term Goals: Week 1:  OT Short Term Goal 1 (Week 1): Pt will complete 3/3 components of toileting with Min A OT Short Term Goal 2 (Week 1): Pt will be able to independently state 2 pressure relief strategies to implement when sitting OOB OT Short Term Goal 3 (Week 1): Pt will complete 2 grooming tasks while sitting at the sink to increase OOB tolerance  Skilled Therapeutic Interventions/Progress Updates:    Pt resting in w/c upon arrival.  Attempted to initiate BUE therex but pt stated he was exhausted from morning therapies.  Pt requested to return to bed in preparation for HD this afternoon. Squat pivot transfer to bed with min A. Pt doffed RLE prosthesis independently. Sit>supine and repositioning in bed with supervision. Pt remained in bed with all needs within reach and bed alarm activated.   Therapy Documentation Precautions:  Precautions Precautions: Fall, Other (comment) Precaution Comments: L BKA wound vac and limb guard; h/o R BKA (prosthetic in room) Restrictions Weight Bearing Restrictions: Yes LLE Weight Bearing: Non weight bearing Other Position/Activity Restrictions: NWB L LE  Pain: Pain Assessment Pain Scale: 0-10 Pain Score: 3  Pain Type: Acute pain Pain Location: Knee Pain Orientation: Left Pain Descriptors / Indicators: Aching;Discomfort Pain Frequency: Constant Pain Onset: On-going Pain Intervention(s):repositioned  Therapy/Group: Individual Therapy  Leroy Libman 11/19/2019, 12:03 PM

## 2019-11-19 NOTE — Progress Notes (Signed)
Inpatient Diabetes Program Recommendations  AACE/ADA: New Consensus Statement on Inpatient Glycemic Control   Target Ranges:  Prepandial:   less than 140 mg/dL      Peak postprandial:   less than 180 mg/dL (1-2 hours)      Critically ill patients:  140 - 180 mg/dL   Results for BODI, PALMERI (MRN [id number]) as of 11/19/2019 14:22  Ref. Range 11/18/2019 06:18 11/18/2019 11:59 11/18/2019 16:29 11/18/2019 17:35 11/18/2019 21:01 11/19/2019 06:13 11/19/2019 12:10  Glucose-Capillary Latest Ref Range: 70 - 99 mg/dL 173 (H) 114 (H) 62 (L) 77 174 (H) 140 (H) 71   Review of Glycemic Control  Diabetes history: DM2 Outpatient Diabetes medications: Basaglar 2-80 units QHS as needed, Humalog 2-14 units TID with meals Current orders for Inpatient glycemic control: Lantus 24 units QHS, Novolog 0-9 units TID with meals, Novolog 0-5 units QHS, Novolog 2 units TID with meals  Inpatient Diabetes Program Recommendations:    Insulin: Please consider decreasing Lantus to 22 units QHS and Novolog correction to very sensitive scale 0-6 units TID with meals.  Thanks, Barnie Alderman, RN, MSN, CDE Diabetes Coordinator Inpatient Diabetes Program [phone] (Team Pager from 8am to 5pm)

## 2019-11-19 NOTE — Progress Notes (Signed)
Physical Therapy Session Note  Patient Details  Name: Anthony Bullock MRN: [id number] Date of Birth: May 30, 1960  Today's Date: 11/19/2019 PT Individual Time: 7829-5621 PT Individual Time Calculation (min): 43 min   Short Term Goals: Week 1:  PT Short Term Goal 1 (Week 1): Patient will perform basic transfers with CGA. PT Short Term Goal 2 (Week 1): [name] will perform sit to/from stand with min A using LRAD. PT Short Term Goal 3 (Week 1): Patient will demonstrate dynamic sitting balance with supervision >5 min.  Skilled Therapeutic Interventions/Progress Updates:    Pt received supine in bed reporting some fatigue from prior therapy sessions but agreeable to participate. Pt wearing L LE limb guard throughout session. Supine>sitting R EOB, HOB partially elevated, with close supervision for safety. Sitting EOB with close supervision for safety donned R LE prosthetic with set-up assist. R squat pivot EOB>w/c with min assist for pivoting hips - demos good hip clearance during transfer. Pt demos ability to don w/c leg rests with increased time. BUE w/c propulsion ~176ft with supervision due to pt veering L requiring cuing to avoid items on that side (difficult to determine if happening from difference in strength of push strokes or due to w/c biasing 1 side) then required total assist for remainder of distance to therapy gym due to B UE fatigue. Discussed D/C planning and therapist provided pt with [place] to provide heights and widths of specific items in the home to determine w/c accessibility - pt reports there is a very steep ramp to get up into their house, that him and his wife had a difficult time getting him up in a w/c prior with both of them pushing and him going up backwards in the w/c because at the top of the ramp there is 1 step-up to get into the house, which the wife had to dependently assist him up backwards in w/c. Pt reports he is concerned that his bedroom, bathroom, and  "man cave" area are not w/c accessible due to narrow door widths. Educated pt on importance of having his wife complete measurement sheet along with taking pictures of concerning areas to improve therapists's ability to simulate home environment while on CIR. Pt reports one of his main short term goals is to increase his independence with transfers. Pt set-up w/c for squat pivot transfers to EOM including management of w/c leg rests, brakes, and armrests with only min cuing. Performed block practice squat pivot transfers w/c<>EOM x3 with min A for pivoting hips and no significant difference in level of assist required going one direction versus the other - min cuing for head/hips relationship with pt demoing good motor planning.  Assessed vitals in sitting: BP 128/53 (MAP 77), HR 68bpm. Sit<>stands in // bars with min assist for lifting into standing and balance. Assessed vitals in standing: BP 105/58 (MAP 73), HR 67bpm with pt reporting feeling "weak" but not specifically lightheaded. Progressed to standing in // bars R LE forward/backwards hop x3 reps with min assist for balance and pt demos sufficient R foot clearance for soon progression to ambulation but due to fatigue at this time returned to sitting. Reassessed vitals: BP 115/41 (MAP 63), HR 68bpm, SpO2 100%. Therapist educated pt on possible need of R LE prosthetic adjustments to improve alignment for standing on only 1 LE if he is going to be doing significant standing/ambulating prior to getting his  L LE prosthetic. (pt reports he uses [employer] for his prosthetic management). Transported back to room  and pt agreeable to remain seated in w/c until next therapy session. Left seated with needs in reach, lines intact, and seat belt alarm on.  Therapy Documentation Precautions:  Precautions Precautions: Fall, Other (comment) Precaution Comments: L BKA wound vac and limb guard; h/o R BKA (prosthetic in room) Restrictions Weight Bearing Restrictions:  Yes LLE Weight Bearing: Non weight bearing Other Position/Activity Restrictions: NWB L LE  Pain: No reports of pain throughout session.   Therapy/Group: Individual Therapy  Tawana Scale , PT, DPT, CSRS  11/19/2019, 10:40 AM

## 2019-11-19 NOTE — Progress Notes (Signed)
Occupational Therapy Session Note  Patient Details  Name: Anthony Bullock MRN: [id number] Date of Birth: May 30, 1960  Today's Date: 11/19/2019 OT Individual Time: 9509-3267 OT Individual Time Calculation (min): 55 min    Short Term Goals: Week 1:  OT Short Term Goal 1 (Week 1): Pt will complete 3/3 components of toileting with Min A OT Short Term Goal 2 (Week 1): Pt will be able to independently state 2 pressure relief strategies to implement when sitting OOB OT Short Term Goal 3 (Week 1): Pt will complete 2 grooming tasks while sitting at the sink to increase OOB tolerance  Skilled Therapeutic Interventions/Progress Updates:    Pt resting in bed upon arrival and ready to get OOB. Pt declined bathing ("they already cleaned me up") and changing hospital gown.  Pt declined donning clothing. Supine>sit EOB with supervision. Sitting balance EOB with supervision. Pt donned RLE prosthesis with supervision. Squat pivot transfer to w/c with mod A. Pt had difficulty clearing buttocks for transfer. Pt propelled w/c approx 75' before requesting assistance. Pt transitioned to [employer] and engaged in BUE therex on arm bike (manual level 4 for 2 mins X 3). Pt required extended rest breaks after 2 mins. Pt initated propelling w/c back to room but required assistance. Pt returned to room and remained in w/c with belt alarm activated and all needs within reach.   Therapy Documentation Precautions:  Precautions Precautions: Fall, Other (comment) Precaution Comments: L BKA wound vac and limb guard; h/o R BKA (prosthetic in room) Restrictions Weight Bearing Restrictions: Yes LLE Weight Bearing: Non weight bearing Other Position/Activity Restrictions: NWB L LE Pain: Pain Assessment Pain Scale: 0-10 Pain Score: 4  Pain Type: Acute pain Pain Location: Knee Pain Orientation: Left Pain Descriptors / Indicators: Aching;Discomfort Pain Frequency: Intermittent Pain Onset: On-going Patients Stated Pain Goal:  0 Pain Intervention(s): Repositioned  Therapy/Group: Individual Therapy  Leroy Libman 11/19/2019, 7:58 AM

## 2019-11-19 NOTE — Progress Notes (Signed)
[facility] Progress Note   Subjective: Seen in room. Had episode of nausea/vomiting this am. For dialysis today    Objective Vitals:   11/18/19 1434 11/18/19 1942 11/19/19 0518 11/19/19 0536  BP: (!) 134/54 (!) 122/55 (!) 152/66   Pulse: 66 63 70   Resp: 18 16 16    Temp: 98.3 F (36.8 C) 97.8 F (36.6 C) 98.4 F (36.9 C)   TempSrc: Oral  Oral   SpO2: 96% 97% 100%   Weight:    82.3 kg  Height:       Physical Exam General: WNWD older man, nad  Heart: S1,S2 RRR No M/R/G Lungs: CTAB anteriorly Abdomen: S, NT Extremities: Bilateral BKA. L stump in protector, wound vac in place.  Dialysis Access: RIJ TD Multiple failed AV accesses.     Additional Objective Labs: Basic Metabolic Panel: Recent Labs  Lab 11/14/19 0651 11/15/19 0804 11/16/19 0847  NA 136 136 137  K 5.6* 4.6 5.4*  CL 100 97* 98  CO2 24 26 27   GLUCOSE 104* 93 116*  BUN 65* 34* 54*  CREATININE 11.29* 8.48* 11.85*  CALCIUM 8.6* 8.6* 8.8*  PHOS 3.4 4.3 5.2*   Liver Function Tests: Recent Labs  Lab 11/14/19 0651 11/15/19 0804 11/16/19 0847  ALBUMIN 3.0* 3.0* 2.7*   No results for input(s): LIPASE, AMYLASE in the last 168 hours. CBC: Recent Labs  Lab 11/14/19 0651 11/15/19 0804 11/16/19 0846  WBC 11.1* 9.0 8.9  HGB 11.1* 11.3* 10.4*  HCT 35.1* 36.3* 33.4*  MCV 90.0 91.2 93.3  PLT 148* 151 144*   Blood Culture    Component Value Date/Time   SDES BLOOD ARTERIAL LINE 09/03/2019 1642   SPECREQUEST  09/03/2019 1642    BOTTLES DRAWN AEROBIC AND ANAEROBIC Blood Culture adequate volume   CULT  09/03/2019 1642    NO GROWTH 5 DAYS Performed at [facility], [address] [place], [place] [address]    REPTSTATUS 09/08/2019 FINAL 09/03/2019 1642    Cardiac Enzymes: No results for input(s): CKTOTAL, CKMB, CKMBINDEX, TROPONINI in the last 168 hours. CBG: Recent Labs  Lab 11/18/19 1159 11/18/19 1629 11/18/19 1735 11/18/19 2101 11/19/19 0613  GLUCAP 114* 62* 77 174*  140*   Iron Studies: No results for input(s): IRON, TIBC, TRANSFERRIN, FERRITIN in the last 72 hours. @lablastinr3 @ Studies/Results: No results found. Medications:  . (feeding supplement) PROSource Plus  30 mL Oral BID BM  . amLODipine  5 mg Oral Daily  . aspirin EC  81 mg Oral Daily  . atorvastatin  80 mg Oral QHS  . carvedilol  25 mg Oral BID WC  . Chlorhexidine Gluconate Cloth  6 each Topical Q0600  . cinacalcet  30 mg Oral Q T,Th,Sa-HD  . doxercalciferol  2.5 mcg Intravenous Q T,Th,Sa-HD  . famotidine  10 mg Oral QHS  . fenofibrate  160 mg Oral Daily  . furosemide  120 mg Oral Daily  . gabapentin  100 mg Oral Daily  . gabapentin  200 mg Oral QHS  . heparin  5,000 Units Subcutaneous Q8H  . insulin aspart  0-5 Units Subcutaneous QHS  . insulin aspart  0-9 Units Subcutaneous TID WC  . insulin aspart  2 Units Subcutaneous TID WC  . insulin glargine  24 Units Subcutaneous QHS  . lanthanum  2,000 mg Oral TID with meals  . lanthanum  500 mg Oral With snacks  . loratadine  10 mg Oral Daily  . multivitamin  1 tablet Oral QHS  .  senna-docusate  1 tablet Oral QPC supper     [facility]600 2.2/5 bath TDC RIJ 83kg  -Hep 1000 units+ 500 units /hr - hect 2.5 ug tiw - epo 4000 units IV tiw - sensipar 30 mg po tiw  Assessment/Plan: 1.PAD:S/p L BKA on 11/3 by Dr. Sharol Given. Now in CIR  2. ESRD: HD TTS. Next HD 11/19/2019. No heparin.  3.HTN/volume:No edema on exam. BP well controlled. HD 11/06 net UF 2.5 Liters. Post wt 79.5. Lower EDW on discharge.  4. Anemia:Hgb 10.4 11/07.  No ESA indicated at this time. 5. Secondary hyperparathyroidism:Calcium and phosphorus controlled. Continue hectorol, sensipar, and binder 6. Nutrition:Albumin low. Added protein supps/renal vits. Renal/Carb mod diet  Lynnda Child PA-C [facility] 11/19/2019,10:40 AM

## 2019-11-19 NOTE — Progress Notes (Signed)
Hypoglycemic Event  CBG: 62  Treatment: 4 oz juice/soda  Symptoms: None  Follow-up CBG: Time:1735 CBG Result:77  Possible Reasons for Event: Unknown  Comments/MD notified:protocol followed    Anthony Bullock Anthony Bullock

## 2019-11-19 NOTE — Progress Notes (Signed)
[place] PHYSICAL MEDICINE & REHABILITATION PROGRESS NOTE   Subjective/Complaints:   Pt reports this AM, pain is better currently- scared will be out of control after therapy. Last oxycodone at 5:30 pm last night.   Had a LOT of vomiting- this AM-LBM yesterday- x4- so unlikely constipation. Feeling bette-r started with nausea and then vomiting, but feeling better even before Zofran.      Pt denies SOB, abd pain, CP, N/V/C/D, and vision changes   Objective:   No results found. No results for input(s): WBC, HGB, HCT, PLT in the last 72 hours. No results for input(s): NA, K, CL, CO2, GLUCOSE, BUN, CREATININE, CALCIUM in the last 72 hours.  Intake/Output Summary (Last 24 hours) at 11/19/2019 0920 Last data filed at 11/19/2019 1245 Gross per 24 hour  Intake --  Output 0 ml  Net 0 ml     Pressure Injury 09/03/19 Sacrum Medial Stage 2 -  Partial thickness loss of dermis presenting as a shallow open injury with a red, pink wound bed without slough. (Active)  09/03/19 1400  Location: Sacrum  Location Orientation: Medial  Staging: Stage 2 -  Partial thickness loss of dermis presenting as a shallow open injury with a red, pink wound bed without slough.  Wound Description (Comments):   Present on Admission: Yes     Pressure Injury 11/16/19 Buttocks Left Stage 2 -  Partial thickness loss of dermis presenting as a shallow open injury with a red, pink wound bed without slough. dry shiny nonblanchable area what appears to be a healing stage 2 (Active)  11/16/19 1435  Location: Buttocks  Location Orientation: Left  Staging: Stage 2 -  Partial thickness loss of dermis presenting as a shallow open injury with a red, pink wound bed without slough.  Wound Description (Comments): dry shiny nonblanchable area what appears to be a healing stage 2  Present on Admission: Yes     Pressure Injury 11/16/19 Buttocks Right Stage 2 -  Partial thickness loss of dermis presenting as a shallow open injury  with a red, pink wound bed without slough. nonblanchable area what appears to be a healing stage 2 with dry flacky skin covering (Active)  11/16/19 1435  Location: Buttocks  Location Orientation: Right  Staging: Stage 2 -  Partial thickness loss of dermis presenting as a shallow open injury with a red, pink wound bed without slough.  Wound Description (Comments): nonblanchable area what appears to be a healing stage 2 with dry flacky skin covering  Present on Admission: Yes    Physical Exam: Vital Signs Blood pressure (!) 152/66, pulse 70, temperature 98.4 F (36.9 C), temperature source Oral, resp. rate 16, height 4' 9.09" (1.45 m), weight 82.3 kg, SpO2 100 %. General: sitting up in bedside chair, RN in room, NAD HEENT: wearing eyeglasses, conjugate gaze  Neck: Supple without JVD or lymphadenopathy Heart: RRR Chest: CTA B/L- no W/R/R- good air movement Abdomen: Soft, NT, ND, (+)BS - not TTP at all Extremities: No clubbing, cyanosis, or edema. Pulses are 2+ Skin: Stage 2 pressure injury to bilateral buttocks and sacrum, so 3 wounds-   Neuro: Ox3 . Cranial nerves 2-12 are intact. Sensory exam is normal. Reflexes are 2+ in all 4's. Fine motor coordination is intact. No tremors. Motor function is grossly 5/5.  Musculoskeletal: Right BKA site well healed- scarred.  L-BKA  with limb guard over wound VAC.  goes right up to point of L medial knee that's so TTP- less TTP this AM Psych:  appropriate  Assessment/Plan: 1. Functional deficits secondary to left BKA which require 3+ hours per day of interdisciplinary therapy in a comprehensive inpatient rehab setting.  Physiatrist is providing close team supervision and 24 hour management of active medical problems listed below.  Physiatrist and rehab team continue to assess barriers to discharge/monitor patient progress toward functional and medical goals  Care Tool:  Bathing    Body parts bathed by patient: Right arm, Left arm, Chest,  Abdomen, Front perineal area, Right upper leg, Left upper leg, Face   Body parts bathed by helper: Buttocks Body parts n/a: Right lower leg, Left lower leg   Bathing assist Assist Level: Minimal Assistance - Patient > 75%     Upper Body Dressing/Undressing Upper body dressing   What is the patient wearing?: Pull over shirt    Upper body assist Assist Level: Set up assist    Lower Body Dressing/Undressing Lower body dressing      What is the patient wearing?: Underwear/pull up     Lower body assist Assist for lower body dressing: Minimal Assistance - Patient > 75%     Toileting Toileting    Toileting assist Assist for toileting: Moderate Assistance - Patient 50 - 74%     Transfers Chair/bed transfer  Transfers assist  Chair/bed transfer activity did not occur: Safety/medical concerns  Chair/bed transfer assist level: Minimal Assistance - Patient > 75%     Locomotion Ambulation   Ambulation assist   Ambulation activity did not occur: Safety/medical concerns (new L BKA with R prosthesis, decreased strength/balance)          Walk 10 feet activity   Assist  Walk 10 feet activity did not occur: Safety/medical concerns        Walk 50 feet activity   Assist Walk 50 feet with 2 turns activity did not occur: Safety/medical concerns         Walk 150 feet activity   Assist Walk 150 feet activity did not occur: Safety/medical concerns         Walk 10 feet on uneven surface  activity   Assist Walk 10 feet on uneven surfaces activity did not occur: Safety/medical concerns         Wheelchair     Assist Will patient use wheelchair at discharge?: Yes Type of Wheelchair: Manual    Wheelchair assist level: Supervision/Verbal cueing Max wheelchair distance: 100'    Wheelchair 50 feet with 2 turns activity    Assist        Assist Level: Supervision/Verbal cueing   Wheelchair 150 feet activity     Assist      Assist  Level: Moderate Assistance - Patient 50 - 74%   Blood pressure (!) 152/66, pulse 70, temperature 98.4 F (36.9 C), temperature source Oral, resp. rate 16, height 4' 9.09" (1.45 m), weight 82.3 kg, SpO2 100 %.   Medical Problem List and Plan: 1.  Impaired mobility and ADLs secondary to left BKA             -patient may not shower             -ELOS/Goals: modI 12-16 days  -Initial CIR evaluations today.  2.  Antithrombotics: -DVT/anticoagulation:  Pharmaceutical: Heparin             -antiplatelet therapy: ASA 81 mg daily 3. Pain Management:  Oxycodone prn managing pain well.              --changed Gabapentin to 100mg  daily and  200mg  HS to better help with sleep at night, patient agreeable  11/8- pain doing better- con't- VAC off Wednesday  11/9- pain tolerable this AM- no Oxy since last evening 4. Mood: LCSW to follow for evaluation and support.              -antipsychotic agents: N/A 5. Neuropsych: This patient is capable of making decisions on his own behalf. 6. Skin/Wound Care: Routine pressure relief measures.              --Wound VAC to stay in place till 11/10  11/9--Stage 2 pressure injury to bilateral buttocks and sacrum- monitor daily and offload q2H. con't wound care   11/9- VAC off wednesday 7. Fluids/Electrolytes/Nutrition: Strict I/O.             -- Renal/CM diet with 1200 cc/FR 8. HTN : Monitor BP tid--continue Amlodipine, coreg and Furosemide 9. ESRD: HD - TTS with nephrology following for assist.              --Schedule HD at the end of the day to help with tolerance of therapy.   -Appreciate nephrology following.  10. T2DM with neuropathy/nephropathy: Hgb A1c-6.5 and well controlled.  Reports was on Basaglar 12 units at bedtime with meal coverage/SSI.                            --Lantus decreased to 25 units on 11/05. May need to decrease further as activity increases. CBGs range from 98-195 currently.   11/8- BGs 62- 173- everything less 120 except x1- will reduce  Lantus to 24 units  11/9- 1 BG 62 yesterday BEFORE lantus decrease- since then 77-174- con't regimen 11.NICM s/p AICD: Monitor for symptoms with increase in mobility.  Continue Lipitor, coreg, ASA, fenofibrate and Lasix  12. Nausea: Has been intermittent. Question due to constipation--will change colace to Senna S and add pepcid for dyspepsia.   11/9- vomting this AM- no constipation that we can tell- con't zofran prn 13. Overweight (BMI 25.46): Dietary consult ordered for education.  14. HTN with hypotension  11/8- will reduce Norvasc to 5 mg daily. - will start in AM  11/9- BP slightly elevated this AM 152/66- will monitor- might need to increase Norvasc to 7.5 mg daily.    LOS: 3 days A FACE TO FACE EVALUATION WAS PERFORMED  Anthony Bullock 11/19/2019, 9:20 AM

## 2019-11-19 NOTE — IPOC Note (Signed)
Overall Plan of Care [facility]) Patient Details Name: Anthony Bullock MRN: [id number] DOB: 07-13-1960  Admitting Diagnosis: Below-knee amputation of left lower extremity [facility])  Hospital Problems: Principal Problem:   Below-knee amputation of left lower extremity ([place]) Active Problems:   Pressure injury of back, stage 2 ([place])   Malnutrition of moderate degree     Functional Problem List: Nursing Endurance, Pain, Motor, Medication Management, Skin Integrity, Perception, Bowel  PT Balance, Pain, Safety, Edema, Endurance, Sensory, Motor, Skin Integrity, Nutrition  OT Balance, Safety, Skin Integrity, Endurance, Motor, Pain  SLP    TR         Basic ADL's: OT Grooming, Bathing, Dressing, Toileting     Advanced  ADL's: OT Simple Meal Preparation     Transfers: PT Bed Mobility, Bed to Chair, Car, Chief Operating Officer: PT Ambulation, Wheelchair Mobility     Additional Impairments: OT None  SLP        TR      Anticipated Outcomes Item Anticipated Outcome  Self Feeding No goal  Swallowing      Basic self-care  Media planner Transfers Supervision  Bowel/Bladder  manage bowel and bladder with mod I assist  Transfers  mod I w/c level  Locomotion  mod I w/c level  Communication     Cognition     Pain  pain level less than 4 on scale of 0-10  Safety/Judgment  remain free of injury, prevent falls with cues and reminders   Therapy Plan: PT Intensity: Minimum of 1-2 x/day ,45 to 90 minutes PT Frequency: 5 out of 7 days PT Duration Estimated Length of Stay: 10-14 days OT Intensity: Minimum of 1-2 x/day, 45 to 90 minutes OT Frequency: 5 out of 7 days OT Duration/Estimated Length of Stay: 10-14 days     Due to the current state of emergency, patients may not be receiving their 3-hours of Medicare-mandated therapy.   Team Interventions: Nursing Interventions Patient/Family Education, Pain Management, Discharge  Planning, Medication Management, Bowel Management, Psychosocial Support, Skin Care/Wound Management, Disease Management/Prevention  PT interventions Ambulation/gait training, Discharge planning, DME/adaptive equipment instruction, Functional mobility training, Pain management, Psychosocial support, Splinting/orthotics, Therapeutic Activities, UE/LE Strength taining/ROM, Training and development officer, Community reintegration, Disease management/prevention, Functional electrical stimulation, Neuromuscular re-education, Patient/family education, Skin care/wound management, Therapeutic Exercise, UE/LE Coordination activities, Wheelchair propulsion/positioning  OT Interventions Training and development officer, Discharge planning, Pain management, Self Care/advanced ADL retraining, Therapeutic Activities, UE/LE Coordination activities, Therapeutic Exercise, Skin care/wound managment, Patient/family education, Functional mobility training, Disease mangement/prevention, Academic librarian, Engineer, drilling, Psychosocial support, UE/LE Strength taining/ROM, Wheelchair propulsion/positioning  SLP Interventions    TR Interventions    SW/CM Interventions Discharge Planning, Psychosocial Support, Patient/Family Education   Barriers to Discharge MD  Medical stability, Home enviroment access/loayout, Wound care, Lack of/limited family support, Hemodialysis, Weight bearing restrictions and Medication compliance  Nursing      PT Inaccessible home environment, Decreased caregiver support, Home environment access/layout, Lack of/limited family support, Hemodialysis, Weight bearing restrictions patient has a steep ramp to enter home, may have church assist with modifying ramp to regulation, patient's wife is only person available for assist at home, narrow doorways limite w/c accessability in the home  OT Home environment access/layout, Decreased caregiver support, Wound Care, Weight bearing restrictions  (providing supervision when spouse's FMLA runs out)    SLP      SW Decreased caregiver support, Lack of/limited family support     Team Discharge Planning: Destination:  PT-Home ,OT- Home , SLP-  Projected Follow-up: PT-Home health PT, OT-  Home health OT, 24 hour supervision/assistance, SLP-  Projected Equipment Needs: PT- , OT- To be determined, SLP-  Equipment Details: PT-patient has w/c and RW, will need L amputee pad for w/c, OT-  Patient/family involved in discharge planning: PT- Patient, Family member/caregiver,  OT-Patient, Family member/caregiver, SLP-   MD ELOS: 10-14 days Medical Rehab Prognosis:  Good Assessment: Pt is a 59 yr old male with ESRD on HD, old R BKA with L BKA, 3 stage II pressure ulcers on B/L buttocks and sacrum- came with them to CIR, and VAC on L BKA til tomorrow,  NICM s/p AICD, and HTN with current hypotension- changing meds.  Goals Mod I to supervision at w/c level.    See Team Conference Notes for weekly updates to the plan of care

## 2019-11-19 NOTE — Patient Care Conference (Signed)
Inpatient RehabilitationTeam Conference and Plan of Care Update Date: 11/19/2019   Time: 11:00 AM    Patient Name: Anthony Bullock      Medical Record Number: [id number]  Date of Birth: 1960-02-23 Sex: Male         Room/Bed: 1H08M/5H84O-96 Payor Info: Payor: MEDICARE / Plan: MEDICARE PART A AND B / Product Type: *No Product type* /    Admit Date/Time:  11/16/2019  1:51 PM  Primary Diagnosis:  Below-knee amputation of left lower extremity [facility])  Hospital Problems: Principal Problem:   Below-knee amputation of left lower extremity ([facility]) Active Problems:   Pressure injury of back, stage 2 ([facility])   Malnutrition of moderate degree    Expected Discharge Date: Expected Discharge Date: 11/29/19  Team Members Present: Physician leading conference: Dr. Genice Rouge Care Coodinator Present: Cecile Sheerer, LCSWA;Mariafernanda Hendricksen Marlyne Beards, RN, BSN, CRRN Nurse Present: Willey Blade, RN PT Present: Peter Congo, PT OT Present: Roney Mans, OT;Ardis Rowan, COTA PPS Coordinator present : Edson Snowball, PT     Current Status/Progress Goal Weekly Team Focus  Bowel/Bladder   Pt is continent of bowel and bladder. Pt is oliguric. LBM-11/07  To remain continent of bowel/bladder.  Assess tolieting needs often   Swallow/Nutrition/ Hydration             ADL's   bathing/LB dressing-min a; toilet transfers-min A squat pivot; toileting-mod A; limited activity tolerance  supervision overall  BADL training, activity tolerance, functional transfers, educaiton   Mobility   up to mod A bed mobility and sitting balance, transfers min A squat pivot, Supervison w/c mobility x 100 ft, sit to stand mod A in // bars (orthostatic)  CGA to mod I, w/c level  sitting balance, safety awareness, transfers, amputee education   Communication             Safety/Cognition/ Behavioral Observations            Pain   Pt has pain to the left knee due to BKA. He can get tylenol prn q4 for mild pain and q4 oxy for severe pain  when needed.  To decrease pain level to make pt more comfortable.  Assess pain q shift or prn.   Skin   Has surgical incision to left knee/BKA. Wound vac connected. Has pressure areas to left, right buttocks.  To promote healing and preventing further skin breakdown.  Assess skin q shift or prn.     Discharge Planning:  D/c to home with 24/7care; wife works from home and is able to assist.   Team Discussion: Continent B/B, Stage 2 to each buttock, and sacrum, refused Coreg yesterday (11/18/19) because of schedule. Given Zofran for nausea, complains of 7/10 pain. OT reports patient is a min/mod assist, fatigues quickly. Supervision goals. PT reports patient is a min/mod assist squat pivot for transfers, mod assist for sit to stand. Impaired endurance, sitting balance impaired as well. Contact guard to mod I at W/C level. Wound vac to discontinue 11/20/19. Patient on target to meet rehab goals: yes  *See Care Plan and progress notes for long and short-term goals.   Revisions to Treatment Plan:  Not at this time.  Teaching Needs: Continue family education with wife, wound care and dressing changes also.  Current Barriers to Discharge: Decreased caregiver support, Home enviroment access/layout, Wound care, Lack of/limited family support, Hemodialysis, Weight bearing restrictions, Medication compliance and Behavior  Possible Resolutions to Barriers: Educate wound care, dressing changes, diabetes education, current medications, weight bearing precautions, reinforce  safety.     Medical Summary Current Status: N/V this AM x1-2; refused coreg yesterday; 2 older areas B/L buttocks; more shearing/transfers; sacral Stage I- nothing open; LBM 11/7- refused senna; pain controlled.  Barriers to Discharge: Decreased family/caregiver support;Behavior;Home enviroment access/layout;Hemodialysis;Weight;Weight bearing restrictions;Wound care  Barriers to Discharge Comments: BP variable- has refused BP meds; -  reports dizziness; R BKA prosthesis- poor sitting balance; Possible Resolutions to [employer] Focus: changing DM meds- low BGs; Zofran for N/V; build endurance; change around BP meds;  d/c 11/20- help from wife/works from home.   Continued Need for Acute Rehabilitation Level of Care: The patient requires daily medical management by a physician with specialized training in physical medicine and rehabilitation for the following reasons: Direction of a multidisciplinary physical rehabilitation program to maximize functional independence : Yes Medical management of patient stability for increased activity during participation in an intensive rehabilitation regime.: Yes Analysis of laboratory values and/or radiology reports with any subsequent need for medication adjustment and/or medical intervention. : Yes   I attest that I was present, lead the team conference, and concur with the assessment and plan of the team.   Tennis Must 11/19/2019, 5:59 PM

## 2019-11-20 ENCOUNTER — Inpatient Hospital Stay (HOSPITAL_COMMUNITY): Payer: Medicare Other | Admitting: Physical Therapy

## 2019-11-20 ENCOUNTER — Inpatient Hospital Stay (HOSPITAL_COMMUNITY): Payer: Medicare Other

## 2019-11-20 LAB — GLUCOSE, CAPILLARY
Glucose-Capillary: 133 mg/dL — ABNORMAL HIGH (ref 70–99)
Glucose-Capillary: 87 mg/dL (ref 70–99)
Glucose-Capillary: 96 mg/dL (ref 70–99)
Glucose-Capillary: 159 mg/dL — ABNORMAL HIGH (ref 70–99)

## 2019-11-20 MED ORDER — SORBITOL 70 % SOLN
45.0000 mL | Freq: Once | Status: AC
  Administered 2019-11-20: 45 mL via ORAL
  Filled 2019-11-20: qty 60

## 2019-11-20 NOTE — Progress Notes (Signed)
Patient ID: Anthony Bullock, male   DOB: [birth year], 59 y.o.   MRN: [id number]  SW returned phone call to pt wife Manuela Schwartz [phone]) to discuss updates from team conference, and scheduled family edu for Monday (11/15) and Wed (11/17) 1pm-3pm.   Loralee Pacas, MSW, [place] Office: [phone] Cell: [phone] Fax: [phone]

## 2019-11-20 NOTE — Progress Notes (Signed)
Occupational Therapy Session Note  Patient Details  Name: Anthony Bullock MRN: [id number] Date of Birth: 16-Jun-1960  Today's Date: 11/20/2019 OT Individual Time: 0700-0800 OT Individual Time Calculation (min): 60 min  and Today's Date: 11/20/2019 OT Missed Time: 15 Minutes Missed Time Reason: Other (comment) (nausea/vomitting)   Short Term Goals: Week 1:  OT Short Term Goal 1 (Week 1): Pt will complete 3/3 components of toileting with Min A OT Short Term Goal 2 (Week 1): Pt will be able to independently state 2 pressure relief strategies to implement when sitting OOB OT Short Term Goal 3 (Week 1): Pt will complete 2 grooming tasks while sitting at the sink to increase OOB tolerance  Skilled Therapeutic Interventions/Progress Updates:    Pt resting in bed upon arrival. Pt had not eaten breakfast but agreeable to sitting EOB to eat. Pt required mod A for supine>sit EOB with HOB elevated. Pt unable to maintain sitting balance EOB without BUE support. Pt unable to eat sitting EOB and returned to supine with HOB elevated. After eating breakfast, pt sat EOB to don R prosthesis. Pt required min A for sitting balance while donning R prosthesis without assitance. Pt required assistance to don L limb guard. Squat pivot transfer to w/c with mod A. Pt unable to boost adequately to transfer without assistance. Pt propelled w/c approx 75' before fatiguing. Pt transitioned to gym. Once in gym, pt began vomitting with large amount of emesis, having just eaten breakfast. Pt returned to room and remained seated in w/c with all needs within reach. Belt alarm activated. Emesis bag in hand.   Therapy Documentation Precautions:  Precautions Precautions: Fall, Other (comment) Precaution Comments: L BKA wound vac and limb guard; h/o R BKA (prosthetic in room) Restrictions Weight Bearing Restrictions: Yes LLE Weight Bearing: Non weight bearing Other Position/Activity Restrictions: NWB L LE General: General OT  Amount of Missed Time: 15 Minutes Pain:  Pt c/o 3/10 pain in L knee; repositioned   Therapy/Group: Individual Therapy  Leroy Libman 11/20/2019, 8:01 AM

## 2019-11-20 NOTE — Progress Notes (Signed)
[facility] Progress Note   Subjective: Completed dialysis yesterday - net UF 1.5L. No issues during treatment Gets some nausea in the am  Wound Vac to come off today    Objective Vitals:   11/19/19 1740 11/19/19 1957 11/20/19 0500 11/20/19 0521  BP: (!) 114/48 136/64  (!) 138/55  Pulse: 75 79  69  Resp: 17 15  16   Temp: 98 F (36.7 C) 98.2 F (36.8 C)  98.2 F (36.8 C)  TempSrc:      SpO2: 93% 95%  93%  Weight:   97.8 kg   Height:       Physical Exam General: WNWD older man, nad  Heart: S1,S2 RRR No M/R/G Lungs: CTAB anteriorly Abdomen: S, NT Extremities: Bilateral BKA. L stump in protector, wound vac in place.  Dialysis Access: RIJ TD Multiple failed AV accesses.     Additional Objective Labs: Basic Metabolic Panel: Recent Labs  Lab 11/15/19 0804 11/16/19 0847 11/19/19 1337  NA 136 137 134*  K 4.6 5.4* 6.3*  CL 97* 98 92*  CO2 26 27 27   GLUCOSE 93 116* 144*  BUN 34* 54* 76*  CREATININE 8.48* 11.85* 13.97*  CALCIUM 8.6* 8.8* 8.6*  PHOS 4.3 5.2* 6.9*   Liver Function Tests: Recent Labs  Lab 11/15/19 0804 11/16/19 0847 11/19/19 1337  ALBUMIN 3.0* 2.7* 2.8*   No results for input(s): LIPASE, AMYLASE in the last 168 hours. CBC: Recent Labs  Lab 11/14/19 0651 11/14/19 0651 11/15/19 0804 11/16/19 0846 11/19/19 1337  WBC 11.1*   < > 9.0 8.9 6.8  HGB 11.1*   < > 11.3* 10.4* 10.1*  HCT 35.1*   < > 36.3* 33.4* 32.4*  MCV 90.0  --  91.2 93.3 91.8  PLT 148*   < > 151 144* 159   < > = values in this interval not displayed.   Blood Culture    Component Value Date/Time   SDES BLOOD ARTERIAL LINE 09/03/2019 1642   SPECREQUEST  09/03/2019 1642    BOTTLES DRAWN AEROBIC AND ANAEROBIC Blood Culture adequate volume   CULT  09/03/2019 1642    NO GROWTH 5 DAYS Performed at [facility], [address] [place], [place] [address]    REPTSTATUS 09/08/2019 FINAL 09/03/2019 1642    Cardiac Enzymes: No results for input(s): CKTOTAL,  CKMB, CKMBINDEX, TROPONINI in the last 168 hours. CBG: Recent Labs  Lab 11/19/19 0613 11/19/19 1210 11/19/19 1745 11/19/19 2117 11/20/19 0605  GLUCAP 140* 71 162* 166* 133*   Iron Studies: No results for input(s): IRON, TIBC, TRANSFERRIN, FERRITIN in the last 72 hours. @lablastinr3 @ Studies/Results: No results found. Medications:  . (feeding supplement) PROSource Plus  30 mL Oral BID BM  . amLODipine  5 mg Oral Daily  . aspirin EC  81 mg Oral Daily  . atorvastatin  80 mg Oral QHS  . carvedilol  25 mg Oral BID WC  . Chlorhexidine Gluconate Cloth  6 each Topical Q0600  . cinacalcet  30 mg Oral Q T,Th,Sa-HD  . doxercalciferol  2.5 mcg Intravenous Q T,Th,Sa-HD  . famotidine  10 mg Oral QHS  . fenofibrate  160 mg Oral Daily  . furosemide  120 mg Oral Daily  . gabapentin  100 mg Oral Daily  . gabapentin  200 mg Oral QHS  . heparin  5,000 Units Subcutaneous Q8H  . insulin aspart  0-5 Units Subcutaneous QHS  . insulin aspart  0-9 Units Subcutaneous TID WC  . insulin aspart  2  Units Subcutaneous TID WC  . insulin glargine  24 Units Subcutaneous QHS  . lanthanum  2,000 mg Oral TID with meals  . lanthanum  500 mg Oral With snacks  . loratadine  10 mg Oral Daily  . multivitamin  1 tablet Oral QHS  . senna-docusate  1 tablet Oral QPC supper  . sorbitol  45 mL Oral Once     [facility]600 2.2/5 bath TDC RIJ 83kg  -Hep 1000 units+ 500 units /hr - hect 2.5 ug tiw - epo 4000 units IV tiw - sensipar 30 mg po tiw  Assessment/Plan: 1.PAD:S/p L BKA on 11/3 by Dr. Sharol Given. Now in CIR  2. ESRD: HD TTS. Next HD 11/11. [name]  3.HTN/volume:No edema on exam. BP well controlled. Plan for lower EDW at discharge.  4. Anemia:Hgb 10.1  No ESA indicated at this time. 5. Secondary hyperparathyroidism:Ca ok. Phosphorus trending up -- Continue hectorol, sensipar, and binder 6. Nutrition:Albumin low. Added protein supps/renal vits. Renal/Carb  mod diet  Lynnda Child PA-C [facility] 11/20/2019,9:21 AM

## 2019-11-20 NOTE — Progress Notes (Signed)
Physical Therapy Session Note  Patient Details  Name: Anthony Bullock MRN: [id number] Date of Birth: [birth year]  Today's Date: 11/20/2019 PT Individual Time: 1000-1100; 1415-1500 PT Individual Time Calculation (min): 60 min and 45 min PT Amount of Missed Time (min): 15 Minutes PT Missed Treatment Reason: Patient fatigue  Short Term Goals: Week 1:  PT Short Term Goal 1 (Week 1): Patient will perform basic transfers with CGA. PT Short Term Goal 2 (Week 1): [name] will perform sit to/from stand with min A using LRAD. PT Short Term Goal 3 (Week 1): Patient will demonstrate dynamic sitting balance with supervision >5 min.  Skilled Therapeutic Interventions/Progress Updates:    Session 1: Pt received seated in w/c in room, agreeable to PT session. Pt reports increase in pain in LLE due to attempted removal of wound vac prior to session, use of repositioning during session for pain management. Pt also reports onset of nausea and emesis this AM but reports feeling slightly better now just fatigued and worn out. Seated BP 123/53, HR 70 with no complaints of dizziness this AM. Pt declines any attempts for standing this AM due to fatigue, agreeable to attempt in PM. Manual w/c propulsion 2 x 100 ft with use of BUE at Supervision level before onset of fatigue. Reviewed management of w/c parts with pt requiring min cueing. Squat pivot transfer to mat table with mod A with cues for clearing buttocks during transfer. Sit to/from supine with Supervision. Supine, sidelying, and prone BLE strengthening therex: SLR, hip abd, SAQ, modified bridges on bolster, hip abd against gravity in SL, prone hip ext, prone HS curls x 10-15 reps each. Provided HEP handout of exercises performed for pt to continue with during downtime and upon d/c home. Seated core strengthening therex: crunches from red therapy wedge with min A for trunk control x 4 reps before onset of fatigue; 2 kg ball punch-outs x 10 reps before onset of  fatigue. Pt then requests to return to room due to fatigue. Squat pivot transfer back to w/c then back to bed with min A. Bed mobility Supervision. Pt left semi-reclined in bed with needs in reach, bed alarm in place. Pt missed 15 min of scheduled therapy session due to fatigue.  Session 2: Pt received seated in bed, agreeable to PT session. No complaints of pain. Pt's wife also present in room during session. Discussion with pt and wife regarding home setup and provided home measurement sheet. Pt's wife to take pictures of narrow doorways and ramp so that therapy sessions can focus on simulating pt's home environment. Pt able to don RLE prosthetic with setup A while seated EOB. Squat pivot transfer with min A throughout session. Manual w/c propulsion up to 100 ft with use of BUE at Supervision level. Seated BP 107/53. Pt agreeable to attempt standing this session with no reports of dizziness. Sit to stand in // bars with min A x 4 reps. Pt able to tolerate standing about 30 sec before onset of fatigue and some lightheadedness. Seated BP following standing 101/48 and symptoms subside with seated rest break. Pt requests to return to bed at end of session. Squat pivot transfer back to bed with min A. Sit to supine Supervision. Pt left semi-reclined in bed with needs in reach, bed alarm in place at end of session.  Therapy Documentation Precautions:  Precautions Precautions: Fall, Other (comment) Precaution Comments: L BKA wound vac and limb guard; h/o R BKA (prosthetic in room) Restrictions Weight Bearing Restrictions: Yes LLE  Weight Bearing: Non weight bearing Other Position/Activity Restrictions: NWB L LE General: PT Amount of Missed Time (min): 15 Minutes PT Missed Treatment Reason: Patient fatigue    Therapy/Group: Individual Therapy   Excell Seltzer, PT, DPT  11/20/2019, 11:07 AM

## 2019-11-21 ENCOUNTER — Inpatient Hospital Stay (HOSPITAL_COMMUNITY): Payer: Medicare Other

## 2019-11-21 ENCOUNTER — Inpatient Hospital Stay (HOSPITAL_COMMUNITY): Payer: Medicare Other | Admitting: Physical Therapy

## 2019-11-21 ENCOUNTER — Telehealth: Payer: Self-pay | Admitting: Orthopedic Surgery

## 2019-11-21 LAB — RENAL FUNCTION PANEL
Albumin: 2.7 g/dL — ABNORMAL LOW (ref 3.5–5.0)
Anion gap: 12 (ref 5–15)
BUN: 66 mg/dL — ABNORMAL HIGH (ref 6–20)
CO2: 25 mmol/L (ref 22–32)
Calcium: 8.6 mg/dL — ABNORMAL LOW (ref 8.9–10.3)
Chloride: 98 mmol/L (ref 98–111)
Creatinine, Ser: 11.47 mg/dL — ABNORMAL HIGH (ref 0.61–1.24)
GFR, Estimated: 5 mL/min — ABNORMAL LOW (ref >60)
Glucose, Bld: 163 mg/dL — ABNORMAL HIGH (ref 70–99)
Phosphorus: 6.3 mg/dL — ABNORMAL HIGH (ref 2.5–4.6)
Potassium: 5 mmol/L (ref 3.5–5.1)
Sodium: 135 mmol/L (ref 135–145)

## 2019-11-21 LAB — CBC
HCT: 33 % — ABNORMAL LOW (ref 39.0–52.0)
Hemoglobin: 10.1 g/dL — ABNORMAL LOW (ref 13.0–17.0)
MCH: 28.1 pg (ref 26.0–34.0)
MCHC: 30.6 g/dL (ref 30.0–36.0)
MCV: 91.7 fL (ref 80.0–100.0)
Platelets: 171 10*3/uL (ref 150–400)
RBC: 3.6 MIL/uL — ABNORMAL LOW (ref 4.22–5.81)
RDW: 15.9 % — ABNORMAL HIGH (ref 11.5–15.5)
WBC: 8.7 10*3/uL (ref 4.0–10.5)
nRBC: 0 % (ref 0.0–0.2)

## 2019-11-21 LAB — GLUCOSE, CAPILLARY
Glucose-Capillary: 88 mg/dL (ref 70–99)
Glucose-Capillary: 82 mg/dL (ref 70–99)
Glucose-Capillary: 194 mg/dL — ABNORMAL HIGH (ref 70–99)
Glucose-Capillary: 186 mg/dL — ABNORMAL HIGH (ref 70–99)

## 2019-11-21 MED ORDER — PENTAFLUOROPROP-TETRAFLUOROETH EX AERO: 1.0000 "application " | INHALATION_SPRAY | CUTANEOUS | Status: DC | PRN

## 2019-11-21 MED ORDER — DOXERCALCIFEROL 4 MCG/2ML IV SOLN
INTRAVENOUS | Status: AC
  Filled 2019-11-21: qty 2

## 2019-11-21 MED ORDER — SODIUM CHLORIDE 0.9 % IV SOLN: 100.0000 mL | INTRAVENOUS | Status: DC | PRN

## 2019-11-21 MED ORDER — HEPARIN SODIUM (PORCINE) 1000 UNIT/ML DIALYSIS: 20.0000 [IU]/kg | INTRAMUSCULAR | Status: DC | PRN

## 2019-11-21 MED ORDER — POLYETHYLENE GLYCOL 3350 17 G PO PACK
17.0000 g | PACK | Freq: Once | ORAL | Status: AC
  Administered 2019-11-21: 17 g via ORAL
  Filled 2019-11-21: qty 1

## 2019-11-21 MED ORDER — LIDOCAINE HCL (PF) 1 % IJ SOLN: 5.0000 mL | INTRAMUSCULAR | Status: DC | PRN

## 2019-11-21 MED ORDER — HEPARIN SODIUM (PORCINE) 1000 UNIT/ML DIALYSIS: 1000.0000 [IU] | INTRAMUSCULAR | Status: DC | PRN

## 2019-11-21 MED ORDER — ALTEPLASE 2 MG IJ SOLR: 2.0000 mg | Freq: Once | INTRAMUSCULAR | Status: DC | PRN

## 2019-11-21 MED ORDER — SENNOSIDES-DOCUSATE SODIUM 8.6-50 MG PO TABS
2.0000 | ORAL_TABLET | Freq: Every day | ORAL | Status: DC
  Administered 2019-11-21 – 2019-11-25 (×3): 2 via ORAL
  Filled 2019-11-21 (×10): qty 2

## 2019-11-21 MED ORDER — SORBITOL 70 % SOLN: 30.0000 mL | Freq: Every day | Status: DC | PRN

## 2019-11-21 MED ORDER — HEPARIN SODIUM (PORCINE) 1000 UNIT/ML IJ SOLN
INTRAMUSCULAR | Status: AC
  Administered 2019-11-21: 3800 [IU] via INTRAVENOUS_CENTRAL
  Filled 2019-11-21: qty 4

## 2019-11-21 MED ORDER — LIDOCAINE-PRILOCAINE 2.5-2.5 % EX CREA: 1.0000 "application " | TOPICAL_CREAM | CUTANEOUS | Status: DC | PRN

## 2019-11-21 NOTE — Progress Notes (Signed)
Occupational Therapy Session Note  Patient Details  Name: JUSTICE MILLIRON MRN: [id number] Date of Birth: [birth year]  Today's Date: 11/21/2019 OT Individual Time: 0721-8288 OT Individual Time Calculation (min): 55 min    Short Term Goals: Week 1:  OT Short Term Goal 1 (Week 1): Pt will complete 3/3 components of toileting with Min A OT Short Term Goal 2 (Week 1): Pt will be able to independently state 2 pressure relief strategies to implement when sitting OOB OT Short Term Goal 3 (Week 1): Pt will complete 2 grooming tasks while sitting at the sink to increase OOB tolerance  Skilled Therapeutic Interventions/Progress Updates:    Pt resting in w/c upon arrival.  Initially discussed home arrangement and entrance. Discussed options and DME requirements/recommendations. BUE therex with orange theraband-3x10 for diagonals, shoulder adbuctions, punches. 5 mins on SciFit level 4 manual. W/c mobility 100' with supervision.  Pt fatigues quickly and requires extended rest breaks. Pt returned to room and remained in w/c with all needs within reach and belt alarm activated.   Therapy Documentation Precautions:  Precautions Precautions: Fall, Other (comment) Precaution Comments: L BKA wound vac and limb guard; h/o R BKA (prosthetic in room) Restrictions Weight Bearing Restrictions: Yes LLE Weight Bearing: Non weight bearing Other Position/Activity Restrictions: NWB L LE    Pain:  Pt c/o 3/10 LLE discomfort; repositioned and adjustments to amputee pad on w/c   Therapy/Group: Individual Therapy  Leroy Libman 11/21/2019, 8:01 AM

## 2019-11-21 NOTE — Progress Notes (Signed)
Patient ID: Anthony Bullock, male   DOB: 12/07/1960, 59 y.o.   MRN: [id number]  SW met with pt in room to discuss d/c recommendations for HHPT/OT and DME: w/c with L amputee pad and DABSC. Pt provided HHA list to select preference. Pt would like SW to order DME and will follow-up if he has DABSC.  *SW received message from pt and pt wife indicating HHA preference is [facility].   SW sent HHPT/OT/SN referral to [facility]. SW waiting on follow-up. SW ordered DME with [employer] [facility] via parachute.  Loralee Pacas, MSW, [place] Office: [phone] Cell: [phone] Fax: [phone]

## 2019-11-21 NOTE — Progress Notes (Signed)
[place] PHYSICAL MEDICINE & REHABILITATION PROGRESS NOTE   Subjective/Complaints:    Pt having vomiting- last time was yesterday-  No nausea this AM so far. But is concerned about vomiting- explained there has been a possible GI bug, but also hasn't had more than 1 BM since last week-  REFUSES  Suppository emphatically- very upset about that- also refused sorbitol yesterday- even though was upset that hadn't had BM- so refused med to go.   Increasing senokot to 2 tab daily- wrote for miralax today AND 1 dose sorbitol if doesn't go.    ROS:  Pt denies SOB, abd pain, CP, N/V, and vision changes  Objective:   No results found. Recent Labs    11/19/19 1337  WBC 6.8  HGB 10.1*  HCT 32.4*  PLT 159   Recent Labs    11/19/19 1337  NA 134*  K 6.3*  CL 92*  CO2 27  GLUCOSE 144*  BUN 76*  CREATININE 13.97*  CALCIUM 8.6*    Intake/Output Summary (Last 24 hours) at 11/21/2019 0951 Last data filed at 11/21/2019 0800 Gross per 24 hour  Intake 920 ml  Output --  Net 920 ml     Pressure Injury 09/03/19 Sacrum Medial Stage 2 -  Partial thickness loss of dermis presenting as a shallow open injury with a red, pink wound bed without slough. (Active)  09/03/19 1400  Location: Sacrum  Location Orientation: Medial  Staging: Stage 2 -  Partial thickness loss of dermis presenting as a shallow open injury with a red, pink wound bed without slough.  Wound Description (Comments):   Present on Admission: Yes     Pressure Injury 11/16/19 Buttocks Left Stage 2 -  Partial thickness loss of dermis presenting as a shallow open injury with a red, pink wound bed without slough. dry shiny nonblanchable area what appears to be a healing stage 2 (Active)  11/16/19 1435  Location: Buttocks  Location Orientation: Left  Staging: Stage 2 -  Partial thickness loss of dermis presenting as a shallow open injury with a red, pink wound bed without slough.  Wound Description (Comments): dry shiny  nonblanchable area what appears to be a healing stage 2  Present on Admission: Yes     Pressure Injury 11/16/19 Buttocks Right Stage 2 -  Partial thickness loss of dermis presenting as a shallow open injury with a red, pink wound bed without slough. nonblanchable area what appears to be a healing stage 2 with dry flacky skin covering (Active)  11/16/19 1435  Location: Buttocks  Location Orientation: Right  Staging: Stage 2 -  Partial thickness loss of dermis presenting as a shallow open injury with a red, pink wound bed without slough.  Wound Description (Comments): nonblanchable area what appears to be a healing stage 2 with dry flacky skin covering  Present on Admission: Yes    Physical Exam: Vital Signs Blood pressure (!) 122/48, pulse 70, temperature 98.2 F (36.8 C), resp. rate 18, height 4' 9.09" (1.45 m), weight 97.8 kg, SpO2 99 %. General: sitting up in bedside chair, appropriate, but occ upset, NAD HEENT: wearing eyeglasses, conjugate gaze  Neck: Supple without JVD or lymphadenopathy Heart: RRR Chest: CTA B/L- no W/R/R- good air movement Abdomen: soft, somewhat distended; NT, hypoactive BS Extremities: No clubbing, cyanosis, or edema. Pulses are 2+ Skin: Stage 2 pressure injury to bilateral buttocks and sacrum, so 3 wounds-   Neuro: Ox3 . Cranial nerves 2-12 are intact. Sensory exam is normal. Reflexes are  2+ in all 4's. Fine motor coordination is intact. No tremors. Motor function is grossly 5/5.  Musculoskeletal: Right BKA site well healed- scarred.  L-BKA  with limb guard over wound VAC.  goes right up to point of L medial knee that's so TTP- less TTP this AM Psych: unhappy about bowel issues  Assessment/Plan: 1. Functional deficits secondary to left BKA which require 3+ hours per day of interdisciplinary therapy in a comprehensive inpatient rehab setting.  Physiatrist is providing close team supervision and 24 hour management of active medical problems listed  below.  Physiatrist and rehab team continue to assess barriers to discharge/monitor patient progress toward functional and medical goals  Care Tool:  Bathing    Body parts bathed by patient: Right arm, Left arm, Chest, Abdomen, Front perineal area, Right upper leg, Left upper leg, Face   Body parts bathed by helper: Buttocks Body parts n/a: Right lower leg, Left lower leg   Bathing assist Assist Level: Minimal Assistance - Patient > 75%     Upper Body Dressing/Undressing Upper body dressing   What is the patient wearing?: Pull over shirt    Upper body assist Assist Level: Set up assist    Lower Body Dressing/Undressing Lower body dressing      What is the patient wearing?: Underwear/pull up     Lower body assist Assist for lower body dressing: Minimal Assistance - Patient > 75%     Toileting Toileting    Toileting assist Assist for toileting: Moderate Assistance - Patient 50 - 74%     Transfers Chair/bed transfer  Transfers assist  Chair/bed transfer activity did not occur: Safety/medical concerns  Chair/bed transfer assist level: Moderate Assistance - Patient 50 - 74% (squat pivot)     Locomotion Ambulation   Ambulation assist   Ambulation activity did not occur: Safety/medical concerns (new L BKA with R prosthesis, decreased strength/balance)          Walk 10 feet activity   Assist  Walk 10 feet activity did not occur: Safety/medical concerns        Walk 50 feet activity   Assist Walk 50 feet with 2 turns activity did not occur: Safety/medical concerns         Walk 150 feet activity   Assist Walk 150 feet activity did not occur: Safety/medical concerns         Walk 10 feet on uneven surface  activity   Assist Walk 10 feet on uneven surfaces activity did not occur: Safety/medical concerns         Wheelchair     Assist Will patient use wheelchair at discharge?: Yes Type of Wheelchair: Manual    Wheelchair assist  level: Supervision/Verbal cueing Max wheelchair distance: 100'    Wheelchair 50 feet with 2 turns activity    Assist        Assist Level: Supervision/Verbal cueing   Wheelchair 150 feet activity     Assist      Assist Level: Moderate Assistance - Patient 50 - 74%   Blood pressure (!) 122/48, pulse 70, temperature 98.2 F (36.8 C), resp. rate 18, height 4' 9.09" (1.45 m), weight 97.8 kg, SpO2 99 %.   Medical Problem List and Plan: 1.  Impaired mobility and ADLs secondary to left BKA             -patient may not shower             -ELOS/Goals: modI 12-16 days  -Initial CIR evaluations today.  2.  Antithrombotics: -DVT/anticoagulation:  Pharmaceutical: Heparin             -antiplatelet therapy: ASA 81 mg daily 3. Pain Management:  Oxycodone prn managing pain well.              --changed Gabapentin to 100mg  daily and 200mg  HS to better help with sleep at night, patient agreeable  11/8- pain doing better- con't- VAC off Wednesday  11/9- pain tolerable this AM- no Oxy since last evening  11/11- pain controlled- still taking oxy prn 4. Mood: LCSW to follow for evaluation and support.              -antipsychotic agents: N/A 5. Neuropsych: This patient is capable of making decisions on his own behalf. 6. Skin/Wound Care: Routine pressure relief measures.              --Wound VAC to stay in place till 11/10  11/9--Stage 2 pressure injury to bilateral buttocks and sacrum- monitor daily and offload q2H. con't wound care   11/9- VAC off Wednesday  11/11- said pain better controlled without VAC on-  7. Fluids/Electrolytes/Nutrition: Strict I/O.             -- Renal/CM diet with 1200 cc/FR 8. HTN : Monitor BP tid--continue Amlodipine, coreg and Furosemide 9. ESRD: HD - TTS with nephrology following for assist.              --Schedule HD at the end of the day to help with tolerance of therapy.   -Appreciate nephrology following.  10. T2DM with neuropathy/nephropathy: Hgb  A1c-6.5 and well controlled.  Reports was on Basaglar 12 units at bedtime with meal coverage/SSI.                            --Lantus decreased to 25 units on 11/05. May need to decrease further as activity increases. CBGs range from 98-195 currently.   11/8- BGs 62- 173- everything less 120 except x1- will reduce Lantus to 24 units  11/9- 1 BG 62 yesterday BEFORE lantus decrease- since then 77-174- con't regimen  11/11- BGs 87-159- controlled- con't regimen 11.NICM s/p AICD: Monitor for symptoms with increase in mobility.  Continue Lipitor, coreg, ASA, fenofibrate and Lasix  12. Nausea/constipation: Has been intermittent. Question due to constipation--will change colace to Senna S and add pepcid for dyspepsia.   11/9- vomting this AM- no constipation that we can tell- con't zofran prn  11/11- LBM 4 days ago and no BM for day prior to that- ordered miralax, increased senokot- and ordered sorbitol prn 13. Overweight (BMI 25.46): Dietary consult ordered for education.  14. HTN with hypotension  11/8- will reduce Norvasc to 5 mg daily. - will start in AM  11/9- BP slightly elevated this AM 152/66- will monitor- might need to increase Norvasc to 7.5 mg daily.  11/11- BP 122/48- this AM- held BP meds today due to hypotension in HD.  15/ Hyperkalemia  11/11- K+ 6.3- per nephrology- baths in HD   LOS: 5 days A FACE TO FACE EVALUATION WAS PERFORMED  Hodges Treiber 11/21/2019, 9:51 AM

## 2019-11-21 NOTE — Progress Notes (Signed)
Physical Therapy Session Note  Patient Details  Name: Anthony Bullock MRN: [id number] Date of Birth: 1960/10/30  Today's Date: 11/21/2019 PT Individual Time: 0900- 1000 and 1100-1158 Pt seen for 60 mins and 58 mins      Short Term Goals: Week 1:  PT Short Term Goal 1 (Week 1): Patient will perform basic transfers with CGA. PT Short Term Goal 2 (Week 1): [name] will perform sit to/from stand with min A using LRAD. PT Short Term Goal 3 (Week 1): Patient will demonstrate dynamic sitting balance with supervision >5 min.  Skilled Therapeutic Interventions/Progress Updates:    session 1:  pt received in [facility] and agreeable to therapy. Pt directed in [facility] mobility for 125' supervision then requested PT assist for rest of distance to gym 2/2 fatigue. Pt directed in seated BLE strengthening exercises 2# on RLE and body weight resistance on LLE for leg lifts, LAQ (on RLE only), marching, hip abduction and adduction 2x10 and BUE strengthening with arm bar 3# for overhead press, chest press, bicep curls, front raises and controlled lowering 2x10. Pt directed in [place] mobility 100' supervision, nursing made aware and agreeable to pt going outside for therapy. Pt taken rest of distance off of unit total A for time and energy. Pt directed in WC mobility outside on uneven surface for 100' min A for small turns, additional 2x150' forward propulsion up steep incline min A with VC for technique as pt reports is entry to home has very steep ramp and his wife assists, reports his is steeper than ramp outside. Pt directed to return inside, to unit, completed 100' then requested therapist assist rest of distance 2/2 fatigue. Pt returned to unit, total A for energy and returned to room, left in [facility] alarm belt set, All needs in reach and in good condition. Call light in hand.     Session 2:pt received in [facility] and agreeable to therapy. Pt denied pain at start of session however during session reported 7/10 pain in LLE post Sit to  stand transfer training. Pt taken to gym in [facility], total A 2/2 fatigue. Pt directed x5 Sit to stand in // at min A with VC for hand placement and technique, once in standing pt directed in static standing with BUE support at // CGA with VC for hip alignment and LLE positioning. Pt able to stand statically for 45s-90s each stand. Pt directed in [facility] mobility for 200' CGA, then prolonged rest break in sitting then directed in additional 25' CGA with VC for technique. Pt requested to return to bed to rest at end of session prior to HD. Pt directed in WC setup to EOB for squat pivot transfer, pt required assist for leg rest removal. Pt then directed in squat pivot transfer with armrest removed from WC, min A. Pt sat EOB for doffing prosthetic and leg guard SBA, then SBA for sit>supine. PT and pt discussed DC planning further with wife having home measurement sheet and pt requiring all measurements for improved planning. Pt agreeable to all recommendations currently. Pt left in bed, alarm set, All needs in reach and in good condition. Call light in hand.    Therapy Documentation Precautions:  Precautions Precautions: Fall, Other (comment) Precaution Comments: L BKA wound vac and limb guard; h/o R BKA (prosthetic in room) Restrictions Weight Bearing Restrictions: Yes LLE Weight Bearing: Non weight bearing Other Position/Activity Restrictions: NWB L LE General:   Vital Signs: Therapy Vitals Pulse Rate: 70 BP: (!) 122/48 Oxygen Therapy SpO2:  99 % Pain:   Mobility:   Locomotion :    Trunk/Postural Assessment :    Balance:   Exercises:   Other Treatments:      Therapy/Group: Individual Therapy  Junie Panning 11/21/2019, 9:44 AM

## 2019-11-21 NOTE — Progress Notes (Signed)
Orthopedic Tech Progress Note Patient Details:  Anthony Bullock March 08, 1960 [id number] Called RN to clarify brace orders, Patient does not want to pay for any new braces and said he will speak with however he needs to and I informed RN that would be patients MD. RN will make MD aware and ortho will wait to hear back from RN or MD Patient ID: Anthony Bullock, male   DOB: [birth year], 59 y.o.   MRN: [id number]   [name] 11/21/2019, 7:22 PM

## 2019-11-21 NOTE — Telephone Encounter (Signed)
[employer] FMLA forms for pts wife received. Sent to [employer].

## 2019-11-21 NOTE — Progress Notes (Signed)
[facility] Progress Note   Subjective: Seen in room. No complaints today. For dialysis today    Objective Vitals:   11/20/19 1321 11/20/19 1954 11/21/19 0457 11/21/19 0818  BP: (!) 133/54 (!) 149/60 (!) 127/58 (!) 122/48  Pulse: 67 72 67 70  Resp: 17 18 18    Temp: 98.2 F (36.8 C) 99 F (37.2 C) 98.2 F (36.8 C)   TempSrc: Oral Oral    SpO2: 99% 99% 98% 99%  Weight:      Height:       Physical Exam General: WNWD older man, nad  Heart: S1,S2 RRR No M/R/G Lungs: CTAB anteriorly Abdomen: S, NT Extremities: Bilateral BKA. L stump in protector  Dialysis Access: RIJ TDC.  Multiple failed AV accesses.     Additional Objective Labs: Basic Metabolic Panel: Recent Labs  Lab 11/15/19 0804 11/16/19 0847 11/19/19 1337  NA 136 137 134*  K 4.6 5.4* 6.3*  CL 97* 98 92*  CO2 26 27 27   GLUCOSE 93 116* 144*  BUN 34* 54* 76*  CREATININE 8.48* 11.85* 13.97*  CALCIUM 8.6* 8.8* 8.6*  PHOS 4.3 5.2* 6.9*   Liver Function Tests: Recent Labs  Lab 11/15/19 0804 11/16/19 0847 11/19/19 1337  ALBUMIN 3.0* 2.7* 2.8*   No results for input(s): LIPASE, AMYLASE in the last 168 hours. CBC: Recent Labs  Lab 11/15/19 0804 11/16/19 0846 11/19/19 1337  WBC 9.0 8.9 6.8  HGB 11.3* 10.4* 10.1*  HCT 36.3* 33.4* 32.4*  MCV 91.2 93.3 91.8  PLT 151 144* 159   Blood Culture    Component Value Date/Time   SDES BLOOD ARTERIAL LINE 09/03/2019 1642   SPECREQUEST  09/03/2019 1642    BOTTLES DRAWN AEROBIC AND ANAEROBIC Blood Culture adequate volume   CULT  09/03/2019 1642    NO GROWTH 5 DAYS Performed at [facility], [address], [place], [place] [address]    REPTSTATUS 09/08/2019 FINAL 09/03/2019 1642    Cardiac Enzymes: No results for input(s): CKTOTAL, CKMB, CKMBINDEX, TROPONINI in the last 168 hours. CBG: Recent Labs  Lab 11/20/19 0605 11/20/19 1136 11/20/19 1655 11/20/19 2059 11/21/19 0609  GLUCAP 133* 87 96 159* 88   Iron Studies: No results for  input(s): IRON, TIBC, TRANSFERRIN, FERRITIN in the last 72 hours. @lablastinr3 @ Studies/Results: No results found. Medications:  . (feeding supplement) PROSource Plus  30 mL Oral BID BM  . amLODipine  5 mg Oral Daily  . aspirin EC  81 mg Oral Daily  . atorvastatin  80 mg Oral QHS  . carvedilol  25 mg Oral BID WC  . Chlorhexidine Gluconate Cloth  6 each Topical Q0600  . cinacalcet  30 mg Oral Q T,Th,Sa-HD  . doxercalciferol  2.5 mcg Intravenous Q T,Th,Sa-HD  . famotidine  10 mg Oral QHS  . fenofibrate  160 mg Oral Daily  . furosemide  120 mg Oral Daily  . gabapentin  100 mg Oral Daily  . gabapentin  200 mg Oral QHS  . heparin  5,000 Units Subcutaneous Q8H  . insulin aspart  0-5 Units Subcutaneous QHS  . insulin aspart  0-9 Units Subcutaneous TID WC  . insulin aspart  2 Units Subcutaneous TID WC  . insulin glargine  24 Units Subcutaneous QHS  . lanthanum  2,000 mg Oral TID with meals  . lanthanum  500 mg Oral With snacks  . loratadine  10 mg Oral Daily  . multivitamin  1 tablet Oral QHS  . senna-docusate  1 tablet Oral QPC  supper     [facility]600 2.2/5 bath TDC RIJ 83kg  -Hep 1000 units+ 500 units /hr - hect 2.5 ug tiw - epo 4000 units IV tiw - sensipar 30 mg po tiw  Assessment/Plan:  1.PAD:S/p L BKA on 11/3 by Dr. Sharol Given. Now in CIR  2. ESRD: HD TTS. HD today. Hyperkalemia noted 11/9. Follow labs today.  3.HTN/volume:. BP well controlled. Volume ok  Plan for lower EDW at discharge.  4. Anemia:Hgb 10.1  No ESA indicated at this time. 5. Secondary hyperparathyroidism:Ca ok. Phosphorus trending up -- Continue hectorol, sensipar, and binder 6. Nutrition:Albumin low. Added protein supps/renal vits. Renal/Carb mod diet. [facility] 11/21/2019,9:32 AM

## 2019-11-21 NOTE — Progress Notes (Signed)
Pt refused morning Heparin dose and sorbitol. Pt was educated on the importance of not skipping a dose of Heparin. Pt continued to refuse and stated "I am not taking any thing at all until after my morning therapy."

## 2019-11-22 ENCOUNTER — Inpatient Hospital Stay (HOSPITAL_COMMUNITY): Payer: Medicare Other

## 2019-11-22 ENCOUNTER — Encounter: Payer: Self-pay | Admitting: Orthopedic Surgery

## 2019-11-22 ENCOUNTER — Inpatient Hospital Stay (HOSPITAL_COMMUNITY): Payer: Medicare Other | Admitting: Physical Therapy

## 2019-11-22 LAB — GLUCOSE, CAPILLARY
Glucose-Capillary: 73 mg/dL (ref 70–99)
Glucose-Capillary: 75 mg/dL (ref 70–99)
Glucose-Capillary: 136 mg/dL — ABNORMAL HIGH (ref 70–99)
Glucose-Capillary: 186 mg/dL — ABNORMAL HIGH (ref 70–99)

## 2019-11-22 MED ORDER — CEPHALEXIN 250 MG PO CAPS
500.0000 mg | ORAL_CAPSULE | Freq: Two times a day (BID) | ORAL | Status: DC
  Administered 2019-11-22 – 2019-11-29 (×15): 500 mg via ORAL
  Filled 2019-11-22 (×15): qty 2

## 2019-11-22 NOTE — Progress Notes (Signed)
Orthopedic Tech Progress Note Patient Details:  Anthony Bullock June 28, 1960 [id number] Called [facility] for a VELCRO for PROSTHETICS  Patient ID: Anthony Bullock, male   DOB: 05/17/1960, 58 y.o.   MRN: [id number]   [name] 11/22/2019, 11:12 AM

## 2019-11-22 NOTE — Progress Notes (Signed)
Occupational Therapy Weekly Progress Note  Patient Details  Name: Anthony Bullock MRN: [id number] Date of Birth: [birth year]  Beginning of progress report period: November 17, 2019 End of progress report period: November 22, 2019  Patient has met 2 of 3 short term goals.  Pt progress has been slow this past week.  Squat pivot transfers have varied from min A to mod A secondary to pt's energy/strength. Pt fatigues easily and requires multiple rest breaks during activities. Pt currently requires min A/supervision for bed mobility and LB dressing tasks at bed level. Pt's bathroom is not w/c accessible. Pt has some concerns about w/c accessibiity of home. Pt's wife to gather measurements.   Patient continues to demonstrate the following deficits: muscle weakness and acute pain, decreased cardiorespiratoy endurance and decreased sitting balance, decreased standing balance, decreased balance strategies and difficulty maintaining precautions and therefore will continue to benefit from skilled OT intervention to enhance overall performance with BADL and Reduce care partner burden.  Patient progressing toward long term goals..  Continue plan of care.  OT Short Term Goals Week 1:  OT Short Term Goal 1 (Week 1): Pt will complete 3/3 components of toileting with Min A OT Short Term Goal 1 - Progress (Week 1): Progressing toward goal OT Short Term Goal 2 (Week 1): Pt will be able to independently state 2 pressure relief strategies to implement when sitting OOB OT Short Term Goal 2 - Progress (Week 1): Met OT Short Term Goal 3 (Week 1): Pt will complete 2 grooming tasks while sitting at the sink to increase OOB tolerance OT Short Term Goal 3 - Progress (Week 1): Met Week 2:  OT Short Term Goal 1 (Week 2): STG=LTG secondary to ELOS     Leroy Libman 11/22/2019, 6:55 AM

## 2019-11-22 NOTE — Progress Notes (Signed)
Office Visit Note   Patient: Anthony Bullock           Date of Birth: 08-02-1960           MRN: [id number] Visit Date: 11/07/2019              Requested by: [name], MD [place],  [place] [address] PCP: [name], MD  Chief Complaint  Patient presents with   Left Foot - Follow-up      HPI: Patient is seen in follow-up for dehiscence transmetatarsal amputation  Assessment & Plan: Visit Diagnoses:  1. Dehiscence of amputation stump ([place])     Plan: We will plan for a transtibial amputation  Follow-Up Instructions: Return in about 1 week (around 11/14/2019).   Ortho Exam  Patient is alert, oriented, no adenopathy, well-dressed, normal affect, normal respiratory effort. Examination patient has dehiscence of the transmetatarsal amputation  Imaging: No results found. No images are attached to the encounter.  Labs: Lab Results  Component Value Date   HGBA1C 6.5 (H) 11/13/2019   HGBA1C 8.1 (H) 07/10/2019   HGBA1C 8.9 (H) 08/06/2018   ESRSEDRATE 94 (H) 03/30/2016   ESRSEDRATE 18 11/30/2012   CRP 16.1 (H) 03/30/2016   REPTSTATUS 09/08/2019 FINAL 09/03/2019   GRAMSTAIN  03/31/2016    RARE WBC PRESENT, PREDOMINANTLY PMN NO ORGANISMS SEEN    CULT  09/03/2019    NO GROWTH 5 DAYS Performed at [facility], [address], [place],  [address]    [place] 03/31/2016   LABORGA ENTEROBACTER SPECIES 03/31/2016     Lab Results  Component Value Date   ALBUMIN 2.7 (L) 11/21/2019   ALBUMIN 2.8 (L) 11/19/2019   ALBUMIN 2.7 (L) 11/16/2019    Lab Results  Component Value Date   MG 2.1 09/04/2019   MG 1.7 08/18/2019   MG 2.1 12/03/2014   No results found for: VD25OH  No results found for: PREALBUMIN CBC EXTENDED Latest Ref Rng & Units 11/21/2019 11/19/2019 11/16/2019  WBC 4.0 - 10.5 K/uL 8.7 6.8 8.9  RBC 4.22 - 5.81 MIL/uL 3.60(L) 3.53(L) 3.58(L)  HGB 13.0 - 17.0 g/dL 10.1(L) 10.1(L) 10.4(L)   HCT 39 - 52 % 33.0(L) 32.4(L) 33.4(L)  PLT 150 - 400 K/uL 171 159 144(L)  NEUTROABS 1.7 - 7.7 K/uL - - -  LYMPHSABS 0.7 - 4.0 K/uL - - -     There is no height or weight on file to calculate BMI.  Orders:  No orders of the defined types were placed in this encounter.  No orders of the defined types were placed in this encounter.    Procedures: No procedures performed  Clinical Data: No additional findings.  ROS:  All other systems negative, except as noted in the HPI. Review of Systems  Objective: Vital Signs: There were no vitals taken for this visit.  Specialty Comments:  No specialty comments available.  PMFS History: Patient Active Problem List   Diagnosis Date Noted   Malnutrition of moderate degree 11/18/2019   Below-knee amputation of left lower extremity ([place]) 11/16/2019   Abscess of left foot 11/13/2019   Dehiscence of amputation stump ([place])    History of complete ray amputation of fifth toe of left foot ([place]) 10/09/2019   Acute pulmonary edema (HCC)    Osteomyelitis of fifth toe of left foot (HCC)    Febrile illness 08/17/2019   SIRS (systemic inflammatory response syndrome) ([place]) 08/17/2019   Thrombocytopenia ([place]) 08/17/2019  Pressure injury of back, stage 2 ([place]) 08/17/2019   Hypertensive emergency 07/10/2019   Onychomycosis 08/16/2016   Status post unilateral below knee amputation, right ([place]) 04/26/2016   Bilateral carotid bruits 03/30/2016   Diabetes mellitus with complication (HCC)    Anemia due to chronic kidney disease    ESRD (end stage renal disease) on dialysis ([place]) 11/30/2015   Acute respiratory failure with hypoxia ([place]) 11/29/2015   At high risk for falls 09/29/2014   Peripheral vascular disease ([place]) 09/29/2014   Osteoarthritis of both knees 01/07/2014   Osteoarthritis of multiple joints 10/06/2013   NICM (nonischemic cardiomyopathy) ([place]) 06/27/2013   CAD (coronary artery disease) 03/13/2013    Abnormal nuclear stress test 46/96/2952   Chronic systolic congestive heart failure ([place]) 01/27/2013   COPD (chronic obstructive pulmonary disease) ([place]) 12/27/2012   Diabetic peripheral neuropathy ([place]) 02/10/2012   URINARY CALCULUS 09/28/2009   Adjustment disorder with depressed mood 08/21/2009   Poorly controlled type II diabetes mellitus with renal complication ([place]) 84/13/2440   Hyperlipidemia 04/14/2008   Essential hypertension 04/14/2008   ALLERGIC RHINITIS 04/14/2008   Past Medical History:  Diagnosis Date   AICD (automatic cardioverter/defibrillator) present    [employer]   Allergic rhinitis    Anemia    Arthritis    Chronic systolic heart failure ([place])    a. ECHO (12/2012) EF 25-30%, HK entireanteroseptal myocardium //  b.  EF 25%, diffuse HK, grade 1 diastolic dysfunction, MAC, mild LAE, normal RVSF, trivial pericardial effusion   COPD (chronic obstructive pulmonary disease) ([place])    Diabetes mellitus type II    Diabetic nephropathy ([place])    Diabetic neuropathy ([place])    ESRD on hemodialysis ([place])    started HD June 2017, goes to [facility] HD [facility], Dr Hinda Lenis   History of cardiac catheterization    a.Myoview 1/15:  There is significant left ventricular dysfunction. There may be slight scar at the apex. There is no significant ischemia. LV Ejection Fraction: 27%  //  b. RHC/LHC (1/15) with mean RA 6, PA 47/22 mean 33, mean PCWP 20, PVR 2.5 WU, CI 2.5; 80% dLAD stenosis, 70% diffuse large D.    History of kidney stones    Hyperlipidemia    Hypertension    Kidney stones    NICM (nonischemic cardiomyopathy) ([place])    Primarily nonischemic. Echo (12/14) with EF 25-30%. Echo (3/15) with EF 25%, mild to moderately dilated LV, normal RV size and systolic function.    Osteomyelitis ([place])    left fifth ray   Pneumonia    Urethral stricture    Wears glasses     Family History  Problem Relation Age of Onset   Bladder Cancer Mother     Alcohol abuse Father    Melanoma Father    Stroke Maternal Grandmother    Heart Problems Maternal Grandmother        unknown   Diabetes Maternal Grandmother    Heart disease Maternal Grandfather    Prostate cancer Maternal Grandfather     Past Surgical History:  Procedure Laterality Date   ABDOMINAL AORTOGRAM W/LOWER EXTREMITY N/A 03/30/2016   Procedure: Abdominal Aortogram w/Lower Extremity;  Surgeon: Angelia Mould, MD;  Location: [place] [facility];  Service: Cardiovascular;  Laterality: N/A;   AMPUTATION Right 04/26/2016   Procedure: Right Below Knee Amputation;  Surgeon: Newt Minion, MD;  Location: [place];  Service: Orthopedics;  Laterality: Right;   AMPUTATION Left 08/21/2019   Procedure: LEFT FOOT 5TH RAY  AMPUTATION;  Surgeon: Newt Minion, MD;  Location: [place] [facility];  Service: Orthopedics;  Laterality: Left;   AMPUTATION Left 11/13/2019   Procedure: LEFT BELOW KNEE AMPUTATION;  Surgeon: Newt Minion, MD;  Location: [place];  Service: Orthopedics;  Laterality: Left;   AV FISTULA PLACEMENT Right 09/08/2015   Procedure: INSERTION OF 4-61m x 45cm  ARTERIOVENOUS (AV) GORE-TEX GRAFT RIGHT UPPER  ARM;  Surgeon: CAngelia Mould MD;  [place]  Service: Vascular;  Laterality: Right;   AV FISTULA PLACEMENT Left 01/14/2016   Procedure: CREATION OF LEFT UPPER ARM ARTERIOVENOUS FISTULA;  Surgeon: CAngelia Mould MD;  [place]  Service: Vascular;  Laterality: Left;   [place] City[place] 08/22/2014   Procedure: RIGHT UPPER ARM [place]  Surgeon: CAngelia Mould MD;  [place]  Service: Vascular;  Laterality: Right;   BELOW KNEE LEG AMPUTATION Right 04/26/2016   CARDIAC CATHETERIZATION     CARDIAC DEFIBRILLATOR PLACEMENT  06/27/2013   Sub Q       BY DR KCaryl Comes  CATARACT EXTRACTION W/PHACO Right 08/06/2018   Procedure: CATARACT EXTRACTION PHACO AND INTRAOCULAR LENS PLACEMENT ([place];  Surgeon: WBaruch Goldmann MD;  Location: [facility];  Service: Ophthalmology;  Laterality: Right;  CDE: 4.06   CATARACT EXTRACTION W/PHACO Left 08/20/2018   Procedure: CATARACT EXTRACTION PHACO AND INTRAOCULAR LENS PLACEMENT (IOC);  Surgeon: WBaruch Goldmann MD;  Location: [facility];  Service: Ophthalmology;  Laterality: Left;  CDE: 6.76   COLONOSCOPY WITH PROPOFOL N/A 07/22/2015   Procedure: COLONOSCOPY WITH PROPOFOL;  Surgeon: HDoran Stabler MD;  [facility];  Service: Gastroenterology;  Laterality: N/A;   FEMORAL-POPLITEAL BYPASS GRAFT Right 03/31/2016   Procedure: BYPASS GRAFT FEMORAL-POPLITEAL ARTERY USING RIGHT GREATER SAPHENOUS NONREVERSED VEIN;  Surgeon: CAngelia Mould MD;  Location: [place]  Service: Vascular;  Laterality: Right;   HERNIA REPAIR     I & D EXTREMITY Right 03/31/2016   Procedure: IRRIGATION AND DEBRIDEMENT FOOT;  Surgeon: CAngelia Mould MD;  [place]  Service: Vascular;  Laterality: Right;   IMPLANTABLE CARDIOVERTER DEFIBRILLATOR IMPLANT N/A 06/27/2013   Procedure: SUB Q ICD;  Surgeon: SDeboraha Sprang MD;  [facility];  Service: Cardiovascular;  Laterality: N/A;   INTRAOPERATIVE ARTERIOGRAM Right 03/31/2016   Procedure: INTRA OPERATIVE ARTERIOGRAM;  Surgeon: CAngelia Mould MD;  [place]  Service: Vascular;  Laterality: Right;   IR GENERIC HISTORICAL Right 11/30/2015   IR THROMBECTOMY AV FISTULA W/THROMBOLYSIS/PTA INC/SHUNT/IMG RIGHT 11/30/2015 GAletta Edouard MD [facility]   IR GENERIC HISTORICAL  11/30/2015   IR [place] VASC ACCESS RIGHT 11/30/2015 GAletta Edouard MD [facility]   IR GENERIC HISTORICAL Right 12/15/2015   IR THROMBECTOMY AV FISTULA W/THROMBOLYSIS/PTA/STENT INC/SHUNT/IMG RT 12/15/2015 DArne Cleveland MD [facility]   IR GENERIC HISTORICAL  12/15/2015   IR [place] VASC ACCESS RIGHT 12/15/2015 DArne Cleveland MD [facility]   IR GENERIC HISTORICAL  12/28/2015   IR FLUORO GUIDE CV LINE RIGHT 12/28/2015  AMarybelle Killings MD [facility]   IR GENERIC HISTORICAL  12/28/2015   IR [place] VASC ACCESS RIGHT 12/28/2015 AMarybelle Killings MD [facility]   LEFT A ND RIGHT HEART CATH  01/30/2013   DR BSung Amabile  LEFT AND RIGHT HEART CATHETERIZATION WITH CORONARY ANGIOGRAM N/A 01/30/2013   Procedure: LEFT AND RIGHT HEART CATHETERIZATION WITH CORONARY ANGIOGRAM;  Surgeon: DJolaine Artist MD;  [facility];  Service: Cardiovascular;  Laterality: N/A;   PERIPHERAL VASCULAR CATHETERIZATION Right 01/26/2015   Procedure: A/V Fistulagram;  Surgeon: Angelia Mould, MD;  Location: [place] [facility];  Service: Cardiovascular;  Laterality: Right;   reapea urethral surgery for recurrent obstruction  2011   TOTAL KNEE ARTHROPLASTY Right 2007   VEIN HARVEST Right 03/31/2016   Procedure: RIGHT GREATER SAPHENOUS VEIN HARVEST;  Surgeon: Angelia Mould, MD;  [facility];  Service: Vascular;  Laterality: Right;   Social History   Occupational History   Not on file  Tobacco Use   Smoking status: Former Smoker    Packs/day: 2.00    Years: 32.00    Pack years: 64.00    Types: Cigarettes    Quit date: 05/11/2010    Years since quitting: 9.5   Smokeless tobacco: Never Used  Vaping Use   Vaping Use: Never used  Substance and Sexual Activity   Alcohol use: No   Drug use: No   Sexual activity: Yes

## 2019-11-22 NOTE — Progress Notes (Signed)
[place] PHYSICAL MEDICINE & REHABILITATION PROGRESS NOTE   Subjective/Complaints:   Pt reports no N/V in 24+ hours.  Concerned about a spot on BKA wants me to look at.   Also vecro has torn off limb guard and [facility] wants to charge him for new limb guard, when his I < 44 week old- will ask for another, no charge, since age of current one.      ROS:  Pt denies SOB, abd pain, CP, N/V/C/D, and vision changes   Objective:   No results found. Recent Labs    11/21/19 1320  WBC 8.7  HGB 10.1*  HCT 33.0*  PLT 171   Recent Labs    11/21/19 1320  NA 135  K 5.0  CL 98  CO2 25  GLUCOSE 163*  BUN 66*  CREATININE 11.47*  CALCIUM 8.6*    Intake/Output Summary (Last 24 hours) at 11/22/2019 1847 Last data filed at 11/22/2019 1817 Gross per 24 hour  Intake 680 ml  Output --  Net 680 ml     Pressure Injury 09/03/19 Sacrum Medial Stage 2 -  Partial thickness loss of dermis presenting as a shallow open injury with a red, pink wound bed without slough. (Active)  09/03/19 1400  Location: Sacrum  Location Orientation: Medial  Staging: Stage 2 -  Partial thickness loss of dermis presenting as a shallow open injury with a red, pink wound bed without slough.  Wound Description (Comments):   Present on Admission: Yes     Pressure Injury 11/16/19 Buttocks Left Stage 2 -  Partial thickness loss of dermis presenting as a shallow open injury with a red, pink wound bed without slough. dry shiny nonblanchable area what appears to be a healing stage 2 (Active)  11/16/19 1435  Location: Buttocks  Location Orientation: Left  Staging: Stage 2 -  Partial thickness loss of dermis presenting as a shallow open injury with a red, pink wound bed without slough.  Wound Description (Comments): dry shiny nonblanchable area what appears to be a healing stage 2  Present on Admission: Yes     Pressure Injury 11/16/19 Buttocks Right Stage 2 -  Partial thickness loss of dermis presenting as a  shallow open injury with a red, pink wound bed without slough. nonblanchable area what appears to be a healing stage 2 with dry flacky skin covering (Active)  11/16/19 1435  Location: Buttocks  Location Orientation: Right  Staging: Stage 2 -  Partial thickness loss of dermis presenting as a shallow open injury with a red, pink wound bed without slough.  Wound Description (Comments): nonblanchable area what appears to be a healing stage 2 with dry flacky skin covering  Present on Admission: Yes    Physical Exam: Vital Signs Blood pressure (!) 115/51, pulse 67, temperature (!) 97.2 F (36.2 C), temperature source Oral, resp. rate 20, height 4' 9.09" (1.45 m), weight 81.4 kg, SpO2 100 %. General: sitting up at bedside chair, appropriate, NAD HEENT: wearing eyeglasses, conjugate gaze  Neck: Supple without JVD or lymphadenopathy Heart: RRR Chest: CTA B/L- no W/R/R- good air movement Abdomen: Soft, NT, ND, (+)BS  Extremities: No clubbing, cyanosis, or edema. Pulses are 2+ Skin: Stage 2 pressure injury to bilateral buttocks and sacrum, so 3 wounds-   Neuro: Ox3 . Cranial nerves 2-12 are intact. Sensory exam is normal. Reflexes are 2+ in all 4's. Fine motor coordination is intact. No tremors. Motor function is grossly 5/5.  Musculoskeletal: L BKA shows a very deep red  spot on anterior - just above tip of L BKA- is actually much deeper red than picture shows- VERY deep red- less dog ears; less swelling Psych: unhappy about bowel issues      Assessment/Plan: 1. Functional deficits secondary to left BKA which require 3+ hours per day of interdisciplinary therapy in a comprehensive inpatient rehab setting.  Physiatrist is providing close team supervision and 24 hour management of active medical problems listed below.  Physiatrist and rehab team continue to assess barriers to discharge/monitor patient progress toward functional and medical goals  Care Tool:  Bathing    Body parts bathed  by patient: Right arm, Left arm, Chest, Abdomen, Front perineal area, Right upper leg, Left upper leg, Face   Body parts bathed by helper: Buttocks Body parts n/a: Right lower leg, Left lower leg   Bathing assist Assist Level: Minimal Assistance - Patient > 75%     Upper Body Dressing/Undressing Upper body dressing   What is the patient wearing?: Pull over shirt    Upper body assist Assist Level: Set up assist    Lower Body Dressing/Undressing Lower body dressing      What is the patient wearing?: Underwear/pull up     Lower body assist Assist for lower body dressing: Minimal Assistance - Patient > 75%     Toileting Toileting    Toileting assist Assist for toileting: Moderate Assistance - Patient 50 - 74%     Transfers Chair/bed transfer  Transfers assist  Chair/bed transfer activity did not occur: Safety/medical concerns  Chair/bed transfer assist level: Moderate Assistance - Patient 50 - 74% (squat pivot)     Locomotion Ambulation   Ambulation assist   Ambulation activity did not occur: Safety/medical concerns (new L BKA with R prosthesis, decreased strength/balance)          Walk 10 feet activity   Assist  Walk 10 feet activity did not occur: Safety/medical concerns        Walk 50 feet activity   Assist Walk 50 feet with 2 turns activity did not occur: Safety/medical concerns         Walk 150 feet activity   Assist Walk 150 feet activity did not occur: Safety/medical concerns         Walk 10 feet on uneven surface  activity   Assist Walk 10 feet on uneven surfaces activity did not occur: Safety/medical concerns         Wheelchair     Assist Will patient use wheelchair at discharge?: Yes Type of Wheelchair: Manual    Wheelchair assist level: Supervision/Verbal cueing Max wheelchair distance: 100'    Wheelchair 50 feet with 2 turns activity    Assist        Assist Level: Supervision/Verbal cueing    Wheelchair 150 feet activity     Assist      Assist Level: Moderate Assistance - Patient 50 - 74%   Blood pressure (!) 115/51, pulse 67, temperature (!) 97.2 F (36.2 C), temperature source Oral, resp. rate 20, height 4' 9.09" (1.45 m), weight 81.4 kg, SpO2 100 %.   Medical Problem List and Plan: 1.  Impaired mobility and ADLs secondary to left BKA             -patient may not shower             -ELOS/Goals: modI 12-16 days  -Initial CIR evaluations today.  2.  Antithrombotics: -DVT/anticoagulation:  Pharmaceutical: Heparin             -  antiplatelet therapy: ASA 81 mg daily 3. Pain Management:  Oxycodone prn managing pain well.              --changed Gabapentin to 100mg  daily and 200mg  HS to better help with sleep at night, patient agreeable  11/12- pain controlled- con't regimen 4. Mood: LCSW to follow for evaluation and support.              -antipsychotic agents: N/A 5. Neuropsych: This patient is capable of making decisions on his own behalf. 6. Skin/Wound Care: Routine pressure relief measures.              --Wound VAC to stay in place till 11/10  11/9--Stage 2 pressure injury to bilateral buttocks and sacrum- monitor daily and offload q2H. con't wound care   11/9- VAC off Wednesday  11/11- said pain better controlled without VAC on-   11/12- will start Keflex base don HD_ 500 mg BID due to deep red spot- could be bruise, could be spreading infection of skin-  7. Fluids/Electrolytes/Nutrition: Strict I/O.             -- Renal/CM diet with 1200 cc/FR 8. HTN : Monitor BP tid--continue Amlodipine, coreg and Furosemide 9. ESRD: HD - TTS with nephrology following for assist.              --Schedule HD at the end of the day to help with tolerance of therapy.   -Appreciate nephrology following.  10. T2DM with neuropathy/nephropathy: Hgb A1c-6.5 and well controlled.  Reports was on Basaglar 12 units at bedtime with meal coverage/SSI.                            --Lantus  decreased to 25 units on 11/05. May need to decrease further as activity increases. CBGs range from 98-195 currently.   11/8- BGs 62- 173- everything less 120 except x1- will reduce Lantus to 24 units  11/9- 1 BG 62 yesterday BEFORE lantus decrease- since then 77-174- con't regimen  11/11- BGs 87-159- controlled- con't regimen `11/12- BGs 73- 194- more spread out- will monitor, but might need to make changes 11.NICM s/p AICD: Monitor for symptoms with increase in mobility.  Continue Lipitor, coreg, ASA, fenofibrate and Lasix  12. Nausea/constipation: Has been intermittent. Question due to constipation--will change colace to Senna S and add pepcid for dyspepsia.   11/9- vomting this AM- no constipation that we can tell- con't zofran prn  11/11- LBM 4 days ago and no BM for day prior to that- ordered miralax, increased senokot- and ordered sorbitol prn  11/12- pt said better- will monitor 13. Overweight (BMI 25.46): Dietary consult ordered for education.  14. HTN with hypotension  11/8- will reduce Norvasc to 5 mg daily. - will start in AM  11/9- BP slightly elevated this AM 152/66- will monitor- might need to increase Norvasc to 7.5 mg daily.  11/11- BP 122/48- this AM- held BP meds today due to hypotension in HD.  15/ Hyperkalemia  11/11- K+ 6.3- per nephrology- baths in HD  11/12- K+ 5.0- much improved  LOS: 6 days A FACE TO FACE EVALUATION WAS PERFORMED  Ashlley Booher 11/22/2019, 6:47 PM

## 2019-11-22 NOTE — Progress Notes (Signed)
Physical Therapy Session Note  Patient Details  Name: Anthony Bullock MRN: [id number] Date of Birth: 07-04-1960  Today's Date: 11/22/2019 PT Individual Time: 1000-1100 and 1430-1528 PT Individual Time Calculation (min): 60 min and 58 mins  Short Term Goals: Week 1:  PT Short Term Goal 1 (Week 1): Patient will perform basic transfers with CGA. PT Short Term Goal 2 (Week 1): [name] will perform sit to/from stand with min A using LRAD. PT Short Term Goal 3 (Week 1): Patient will demonstrate dynamic sitting balance with supervision >5 min.  Skilled Therapeutic Interventions/Progress Updates:    session 1: pt received in [facility] and agreeable to therapy. Pt directed in [facility] mobility out of room for 130' at [facility] with reported fatigue. Pt directed in 2x5 Sit to stand to Rolling walker at initially mod A with mod A for initial standing balance mirror for visual feedback to improve standing position of Lt hip with good effect; improved to min A. while in standing pt directed in static standing at Rolling walker for 30-45s to improve standing tolerance, balance righting reactions, and decrease fall risk min A-CGA. Pt directed in seated Bilateral lower extremity strengthening exercises with 2# on Right lower extremity and body weight resistance on Left lower extremity of marching, LAQ (on RLE only), hip abduction and adduction 2x20 and Bilateral upper extremity exercises with 3# arm bar for overhead press, chest press, bicep curls, tricep extension, and front raises 2x20. Pt directed in [place] mobility to return to room, min A for doorways but grossly CGA for 130'. Pt requested to remain in [facility], alarm belt set, All needs in reach and in good condition. Call light in hand.  Pt did not rank pain but reported "slight" soreness in Right lower extremity.  Session 2: pt received in [facility] and agreeable to therapy. pt denied pain at start and end of session. Pt's wife present and her and pt had questions about DC planning for home and  home layout. PT addressed all questions, at this time pt will require WC use for mobility however they are concerned as his current WC does not fit through all doors in the home, pt and wife educated on [facility] recommendations and that our goal is to continue with transfer training and standing balance however further recommendations with equipment will be addressed closer to discharge. Both agreeable, pt's wife also brought in home measurement sheet filled out and largest door way measured 26" to bedroom. Once questions answered, pt agreeable to [facility] mobility out of room to gym , 130' SBA for straight paths, min A for one turn. Pt directed in 2x5 Sit to stand from [facility] to Rolling walker at min A grossly with min A improving to CGA for standing balance with Left lower extremity in good position. Pt directed in x4 stand pivot transfers with Rolling walker to right and left directions with min A and VC for technique and walker placement, assistance grossly for stability. Pt requires rest breaks throughout session 2/2 decreased tolerance to activity but remains very motivated to participate. Pt directed in [place] mobility to return to room, SBA and able to setup [facility] for squat pivot to return to bed with good safety awareness, CGA. Pt directed in sit>supine SBA, left in bed, alarm set, All needs in reach and in good condition. Call light in hand.    Therapy Documentation Precautions:  Precautions Precautions: Fall, Other (comment) Precaution Comments: L BKA wound vac and limb guard; h/o R BKA (prosthetic in room) Restrictions  Weight Bearing Restrictions: Yes LLE Weight Bearing: Non weight bearing Other Position/Activity Restrictions: NWB L LE General:   Vital Signs: Therapy Vitals BP: (!) 141/68    Therapy/Group: Individual Therapy  Junie Panning 11/22/2019, 11:02 AM

## 2019-11-22 NOTE — Progress Notes (Signed)
[facility] Progress Note   Subjective:   Seen and examined in room.  Patient reports he is feeling pretty good.  Dialysis has been going well.  No specific complaints.   Objective Vitals:   11/21/19 1736 11/21/19 2030 11/22/19 0501 11/22/19 0833  BP: (!) 159/56 (!) 144/52 (!) 150/50 (!) 141/68  Pulse: 75 76 66   Resp: 18 18 18    Temp: 97.7 F (36.5 C) 98.6 F (37 C) 98.2 F (36.8 C)   TempSrc:   Oral   SpO2: 96% 96% 98%   Weight:      Height:       Physical Exam General:WDWN male in NAD, sitting in wheelchair Heart:RRR Lungs:CTAB Abdomen:soft, NTND Extremities:b/l BKA - no edema Dialysis Access: TDC. Multiple failed AV accesses   Filed Weights   11/20/19 0500 11/21/19 1320 11/21/19 1700  Weight: 97.8 kg 82.8 kg 81.4 kg    Intake/Output Summary (Last 24 hours) at 11/22/2019 1421 Last data filed at 11/22/2019 0800 Gross per 24 hour  Intake 240 ml  Output 1000 ml  Net -760 ml    Additional Objective Labs: Basic Metabolic Panel: Recent Labs  Lab 11/16/19 0847 11/19/19 1337 11/21/19 1320  NA 137 134* 135  K 5.4* 6.3* 5.0  CL 98 92* 98  CO2 27 27 25   GLUCOSE 116* 144* 163*  BUN 54* 76* 66*  CREATININE 11.85* 13.97* 11.47*  CALCIUM 8.8* 8.6* 8.6*  PHOS 5.2* 6.9* 6.3*   Liver Function Tests: Recent Labs  Lab 11/16/19 0847 11/19/19 1337 11/21/19 1320  ALBUMIN 2.7* 2.8* 2.7*   CBC: Recent Labs  Lab 11/16/19 0846 11/19/19 1337 11/21/19 1320  WBC 8.9 6.8 8.7  HGB 10.4* 10.1* 10.1*  HCT 33.4* 32.4* 33.0*  MCV 93.3 91.8 91.7  PLT 144* 159 171   CBG: Recent Labs  Lab 11/21/19 1143 11/21/19 1737 11/21/19 2110 11/22/19 0608 11/22/19 1131  GLUCAP 82 194* 186* 73 75    Medications:  . (feeding supplement) PROSource Plus  30 mL Oral BID BM  . amLODipine  5 mg Oral Daily  . aspirin EC  81 mg Oral Daily  . atorvastatin  80 mg Oral QHS  . carvedilol  25 mg Oral BID WC  . cephALEXin  500 mg Oral Q12H  . Chlorhexidine  Gluconate Cloth  6 each Topical Q0600  . cinacalcet  30 mg Oral Q T,Th,Sa-HD  . doxercalciferol  2.5 mcg Intravenous Q T,Th,Sa-HD  . famotidine  10 mg Oral QHS  . fenofibrate  160 mg Oral Daily  . furosemide  120 mg Oral Daily  . gabapentin  100 mg Oral Daily  . gabapentin  200 mg Oral QHS  . heparin  5,000 Units Subcutaneous Q8H  . insulin aspart  0-5 Units Subcutaneous QHS  . insulin aspart  0-9 Units Subcutaneous TID WC  . insulin aspart  2 Units Subcutaneous TID WC  . insulin glargine  24 Units Subcutaneous QHS  . lanthanum  2,000 mg Oral TID with meals  . lanthanum  500 mg Oral With snacks  . loratadine  10 mg Oral Daily  . multivitamin  1 tablet Oral QHS  . senna-docusate  2 tablet Oral QPC supper    [facility]600 2.2/5 bath TDC RIJ 83kg  -Hep 1000units+ 500units/hr - hect 2.5 ug tiw - epo 4000unitsIV tiw - sensipar 30mg po tiw  Assessment/Plan: 1. PAD - s/p L BKA on 11/3 by Dr. Sharol Given. Now in CIR. 2.  ESRD - on HD TTS. K 5.0 yesterday pre HD.  Next HD tomorrow with labs prior.  3. Anemia of CKD- Hgb 10.1. No ESA indicated at this time. 4. Secondary hyperparathyroidism - Ca at goal. Phos elevated.  Continue binders, sensipar and hectorol.  5. HTN/volume - BP well controlled.  Does not appear volume overloaded.  Will need lower EDW on discharge.  6. Nutrition - Renal diet w/fluid restrictions. Alb low, continue protein supplements/renal vits.   Jen Mow, PA-C [facility] 11/22/2019,2:21 PM  LOS: 6 days

## 2019-11-22 NOTE — Progress Notes (Addendum)
Patient ID: Anthony Bullock, male   DOB: 01-05-1961, 59 y.o.   MRN: [id number]  Per OT, pt needs padded DABSC. SW informed not likely this is covered under insurance, and most vendors do not carry. SW reached out to [facility] to inquire on item. If they do not carry, SW will discuss with pt and pt wife on purchasing item.  *[facility] does not carry padded DABSC. SW to discuss with pt.   Per PT, pt wife had questions about w/c. SW met with pt wife to answer questions related and if the arms will be drop arm or actually come off. SW informed not sure which style of w/c pt will receive. SW waiting on updates from [employer].  *SW informed pt can have a w/c with arms that drop down to the side or come off. SW requested patient have arms that drop down to the side versus coming off.   Loralee Pacas, MSW, [place] Office: [phone] Cell: [phone] Fax: [phone]

## 2019-11-22 NOTE — Progress Notes (Signed)
Occupational Therapy Session Note  Patient Details  Name: Anthony Bullock MRN: [id number] Date of Birth: [birth year]  Today's Date: 11/22/2019 OT Individual Time: 0700-0810 OT Individual Time Calculation (min): 70 min    Short Term Goals: Week 2:  OT Short Term Goal 1 (Week 2): STG=LTG secondary to ELOS  Skilled Therapeutic Interventions/Progress Updates:    OT intervention with focus on w/c mobility, BUE therex, discharge planning, and activity tolerance. W/c mobility 100'X2 and 50X1. BUE therex including SciFit level 4 X 7 mins. BUE therex tapping ball with 5# bar and rowing with 5# bar 15X3. W/c pushups 10x3. Pt required rest breaks between sets. SciFit activity significant improved from earlier in week. Pt commented on his improved UB strength and endurance. Pt returned to room and remained in w/c with all needs within reach.  Belt alarm activated.  Therapy Documentation Precautions:  Precautions Precautions: Fall, Other (comment) Precaution Comments: L BKA wound vac and limb guard; h/o R BKA (prosthetic in room) Restrictions Weight Bearing Restrictions: Yes LLE Weight Bearing: Non weight bearing Other Position/Activity Restrictions: NWB L LE Pain:  Pt stated his pain is "ok" this morning   Therapy/Group: Individual Therapy  Leroy Libman 11/22/2019, 8:14 AM

## 2019-11-22 NOTE — Progress Notes (Signed)
Pt dressing to LLE changed, tolerated well. This  Nurse assessed surgical incision. Pt expressed concern about red area near incision. Area red, warm, increased pain w/ palpation. Provider will be made aware during morning rounds.

## 2019-11-23 ENCOUNTER — Inpatient Hospital Stay (HOSPITAL_COMMUNITY): Payer: Medicare Other | Admitting: Physical Therapy

## 2019-11-23 LAB — CBC
HCT: 32.9 % — ABNORMAL LOW (ref 39.0–52.0)
Hemoglobin: 10.1 g/dL — ABNORMAL LOW (ref 13.0–17.0)
MCH: 28.3 pg (ref 26.0–34.0)
MCHC: 30.7 g/dL (ref 30.0–36.0)
MCV: 92.2 fL (ref 80.0–100.0)
Platelets: 165 10*3/uL (ref 150–400)
RBC: 3.57 MIL/uL — ABNORMAL LOW (ref 4.22–5.81)
RDW: 15.6 % — ABNORMAL HIGH (ref 11.5–15.5)
WBC: 5.9 10*3/uL (ref 4.0–10.5)
nRBC: 0 % (ref 0.0–0.2)

## 2019-11-23 LAB — RENAL FUNCTION PANEL
Albumin: 2.6 g/dL — ABNORMAL LOW (ref 3.5–5.0)
Anion gap: 13 (ref 5–15)
BUN: 61 mg/dL — ABNORMAL HIGH (ref 6–20)
CO2: 27 mmol/L (ref 22–32)
Calcium: 8.9 mg/dL (ref 8.9–10.3)
Chloride: 94 mmol/L — ABNORMAL LOW (ref 98–111)
Creatinine, Ser: 10.66 mg/dL — ABNORMAL HIGH (ref 0.61–1.24)
GFR, Estimated: 5 mL/min — ABNORMAL LOW (ref >60)
Glucose, Bld: 176 mg/dL — ABNORMAL HIGH (ref 70–99)
Phosphorus: 6.8 mg/dL — ABNORMAL HIGH (ref 2.5–4.6)
Potassium: 5 mmol/L (ref 3.5–5.1)
Sodium: 134 mmol/L — ABNORMAL LOW (ref 135–145)

## 2019-11-23 LAB — GLUCOSE, CAPILLARY
Glucose-Capillary: 103 mg/dL — ABNORMAL HIGH (ref 70–99)
Glucose-Capillary: 146 mg/dL — ABNORMAL HIGH (ref 70–99)
Glucose-Capillary: 194 mg/dL — ABNORMAL HIGH (ref 70–99)
Glucose-Capillary: 197 mg/dL — ABNORMAL HIGH (ref 70–99)

## 2019-11-23 MED ORDER — ALTEPLASE 2 MG IJ SOLR
INTRAMUSCULAR | Status: AC
  Administered 2019-11-23: 4 mg
  Filled 2019-11-23: qty 4

## 2019-11-23 MED ORDER — HEPARIN SODIUM (PORCINE) 1000 UNIT/ML IJ SOLN
INTRAMUSCULAR | Status: AC
  Filled 2019-11-23: qty 4

## 2019-11-23 MED ORDER — DOXERCALCIFEROL 4 MCG/2ML IV SOLN
INTRAVENOUS | Status: AC
  Filled 2019-11-23: qty 2

## 2019-11-23 MED ORDER — CHLORHEXIDINE GLUCONATE CLOTH 2 % EX PADS
6.0000 | MEDICATED_PAD | Freq: Every day | CUTANEOUS | Status: DC
  Administered 2019-11-24 – 2019-11-28 (×5): 6 via TOPICAL

## 2019-11-23 NOTE — Progress Notes (Signed)
[facility] Progress Note   Subjective:   Patient seen and examined at bedside in dialysis.  Tolerating dialysis well.  No new complaints.  Denies CP, SOB, edema, orthopnea, weakness and fatigue.  Objective Vitals:   11/22/19 1449 11/22/19 1927 11/23/19 0500 11/23/19 0548  BP: (!) 115/51 (!) 150/61  (!) 139/59  Pulse: 67 64  64  Resp: 20 18  16   Temp: (!) 97.2 F (36.2 C) 97.9 F (36.6 C)  98.2 F (36.8 C)  TempSrc: Oral Oral  Oral  SpO2: 100% 97%  93%  Weight:   82.2 kg   Height:       Physical Exam General:WDWN male in NAD Heart:RRR Lungs:CTAB Abdomen:soft, NTND Extremities:b/l BKA, no LE edema Dialysis Access: TDC, LU AVF maturing   Filed Weights   11/21/19 1320 11/21/19 1700 11/23/19 0500  Weight: 82.8 kg 81.4 kg 82.2 kg    Intake/Output Summary (Last 24 hours) at 11/23/2019 1401 Last data filed at 11/23/2019 0700 Gross per 24 hour  Intake 460 ml  Output --  Net 460 ml    Additional Objective Labs: Basic Metabolic Panel: Recent Labs  Lab 11/19/19 1337 11/21/19 1320  NA 134* 135  K 6.3* 5.0  CL 92* 98  CO2 27 25  GLUCOSE 144* 163*  BUN 76* 66*  CREATININE 13.97* 11.47*  CALCIUM 8.6* 8.6*  PHOS 6.9* 6.3*   Liver Function Tests: Recent Labs  Lab 11/19/19 1337 11/21/19 1320  ALBUMIN 2.8* 2.7*   CBC: Recent Labs  Lab 11/19/19 1337 11/21/19 1320  WBC 6.8 8.7  HGB 10.1* 10.1*  HCT 32.4* 33.0*  MCV 91.8 91.7  PLT 159 171   CBG: Recent Labs  Lab 11/22/19 1131 11/22/19 1643 11/22/19 2055 11/23/19 0609 11/23/19 1141  GLUCAP 75 136* 186* 103* 146*    Medications:   (feeding supplement) PROSource Plus  30 mL Oral BID BM   amLODipine  5 mg Oral Daily   aspirin EC  81 mg Oral Daily   atorvastatin  80 mg Oral QHS   carvedilol  25 mg Oral BID WC   cephALEXin  500 mg Oral Q12H   Chlorhexidine Gluconate Cloth  6 each Topical Q0600   [START ON 11/24/2019] Chlorhexidine Gluconate Cloth  6 each Topical Q0600    cinacalcet  30 mg Oral Q T,Th,Sa-HD   doxercalciferol  2.5 mcg Intravenous Q T,Th,Sa-HD   famotidine  10 mg Oral QHS   fenofibrate  160 mg Oral Daily   furosemide  120 mg Oral Daily   gabapentin  100 mg Oral Daily   gabapentin  200 mg Oral QHS   heparin  5,000 Units Subcutaneous Q8H   insulin aspart  0-5 Units Subcutaneous QHS   insulin aspart  0-9 Units Subcutaneous TID WC   insulin aspart  2 Units Subcutaneous TID WC   insulin glargine  24 Units Subcutaneous QHS   lanthanum  2,000 mg Oral TID with meals   lanthanum  500 mg Oral With snacks   loratadine  10 mg Oral Daily   multivitamin  1 tablet Oral QHS   senna-docusate  2 tablet Oral QPC supper    [facility] 400/600 2.2/5 bath TDC RIJ 83kg  -Hep 1000units+ 500units/hr - hect 2.5 ug tiw - epo 4000unitsIV tiw - sensipar 30mg po tiw  Assessment/Plan: 1. PAD - s/p L BKA on 11/3 by Dr. Sharol Given. Now in CIR. 2. ESRD - on HD TTS. K 5.0 yesterday pre HD.  Labs drawn prior to HD today.   3. Anemia of CKD- Hgb 10.1. No ESA indicated at this time. 4. Secondary hyperparathyroidism - Ca at goal. Phos elevated.  Continue binders, sensipar and hectorol. New labs pending. 5. HTN/volume - BP well controlled.  Does not appear volume overloaded.  Will need lower EDW on discharge.  6. Nutrition - Renal diet w/fluid restrictions. Alb low, continue protein supplements/renal vits.   Jen Mow, PA-C [facility] 11/23/2019,2:01 PM  LOS: 7 days

## 2019-11-23 NOTE — Progress Notes (Signed)
Physical Therapy Session Note  Patient Details  Name: Anthony Bullock MRN: [id number] Date of Birth: [birth year]  Today's Date: 11/23/2019 PT Individual Time: 0800-0900 and 1000-1030 PT Individual Time Calculation (min): 60 min and 30 mins  Short Term Goals: Week 1:  PT Short Term Goal 1 (Week 1): Patient will perform basic transfers with CGA. PT Short Term Goal 2 (Week 1): [name] will perform sit to/from stand with min A using LRAD. PT Short Term Goal 3 (Week 1): Patient will demonstrate dynamic sitting balance with supervision >5 min.  Skilled Therapeutic Interventions/Progress Updates:    session 1: pt received in [facility]  and agreeable to therapy. Pt directed in WC propulsion to day room CGA. Pt directed in stand pivot transfer with Rolling walker min A to nustep for 4 mins and 10 s on level 4, one rest break at 2.5 mins the continued at same resistance for rest of time however pt required to stop activity at that time 2/2 pain left residual limb;  With Right lower extremity performing task and Lt residual limb supported throughout with limb guard in place throughout session, for improved strengthening, tolerance to activity. Pt reported 14 on BORG scale post activity. Pt directed in stand pivot to [facility] with Rolling walker mod A for stability, rest break in WC, then directed in 2x5 Sit to stand to Rolling walker min A for improved I with transfers and static standing balance; CGA for static standing balance at Rolling walker. Pt directed in stand pivot transfer to mat table for dynamic sitting balance training with ball toss with unsupported trunk, for improved core and trunk strength, balance and decrease fall risk to progress to standing balance, 2x25 completed. Pt directed in additional x5 Sit to stand from mat table with emphasis being on increasing static standing time at Rolling walker pt able to tolerate 1 min with each stand at [place]. Pt directed in stand pivot to return to [facility], forward propulsion to  return to room.  Pt left in WC, alarm belt set, All needs in reach and in good condition. Call light in hand.  Pt denied pain at start and end of session.   Session 2: pt received in [facility] and agreeable to therapy. Wife present, denied pain at start and end of session. Pt directed in [facility] mobility forward propulsion for 90' CGA with one Lt deviation noted with pt able to correct with extra time and VC. Pt required manual assist to continue rest of distance to hallway with straight path to continue [facility] training, once rest break completed, pt directed in addition 140' in straight path, CGA, rest break then additional 160' CGA forward propulsion. Pt reported his legs felt "pretty tired" after previous session of PT and requested a break but agreeable to arm exercises. Pt required manual assistance to return to gym, total A for time and energy. Pt directed in seated Bilateral upper extremity strengthening exercises of overhead press, chest press, bicep curls, tricep curls, front raises, and rows x15 each with 4#. Pt directed in WC total A for time and energy, requested to lay down, able to setup [facility] himself for transfer, required assistance for leg rest removal, directed in squat pivot to bed, sit>supine SBA., left in bed, wife present, alarm set, All needs in reach and in good condition. Call light in hand.    Therapy Documentation Precautions:  Precautions Precautions: Fall, Other (comment) Precaution Comments: L BKA wound vac and limb guard; h/o R BKA (prosthetic in room) Restrictions  Weight Bearing Restrictions: Yes LLE Weight Bearing: Non weight bearing Other Position/Activity Restrictions: NWB L LE       Therapy/Group: Individual Therapy  Anthony Bullock 11/23/2019, 9:01 AM

## 2019-11-23 NOTE — Progress Notes (Signed)
[place] PHYSICAL MEDICINE & REHABILITATION PROGRESS NOTE   Subjective/Complaints:   Pt's wife at bedside.  Pt reports they fixed the vlecro on his limb guard- very happy.   Feels knee/BKA feels a little better.  Notes that is has a little drainage- like last one.     ROS:  Pt denies SOB, abd pain, CP, N/V/C/D, and vision changes   Objective:   No results found. Recent Labs    11/21/19 1320  WBC 8.7  HGB 10.1*  HCT 33.0*  PLT 171   Recent Labs    11/21/19 1320  NA 135  K 5.0  CL 98  CO2 25  GLUCOSE 163*  BUN 66*  CREATININE 11.47*  CALCIUM 8.6*    Intake/Output Summary (Last 24 hours) at 11/23/2019 1353 Last data filed at 11/23/2019 0700 Gross per 24 hour  Intake 460 ml  Output --  Net 460 ml     Pressure Injury 09/03/19 Sacrum Medial Stage 2 -  Partial thickness loss of dermis presenting as a shallow open injury with a red, pink wound bed without slough. (Active)  09/03/19 1400  Location: Sacrum  Location Orientation: Medial  Staging: Stage 2 -  Partial thickness loss of dermis presenting as a shallow open injury with a red, pink wound bed without slough.  Wound Description (Comments):   Present on Admission: Yes     Pressure Injury 11/16/19 Buttocks Left Stage 2 -  Partial thickness loss of dermis presenting as a shallow open injury with a red, pink wound bed without slough. dry shiny nonblanchable area what appears to be a healing stage 2 (Active)  11/16/19 1435  Location: Buttocks  Location Orientation: Left  Staging: Stage 2 -  Partial thickness loss of dermis presenting as a shallow open injury with a red, pink wound bed without slough.  Wound Description (Comments): dry shiny nonblanchable area what appears to be a healing stage 2  Present on Admission: Yes     Pressure Injury 11/16/19 Buttocks Right Stage 2 -  Partial thickness loss of dermis presenting as a shallow open injury with a red, pink wound bed without slough. nonblanchable area  what appears to be a healing stage 2 with dry flacky skin covering (Active)  11/16/19 1435  Location: Buttocks  Location Orientation: Right  Staging: Stage 2 -  Partial thickness loss of dermis presenting as a shallow open injury with a red, pink wound bed without slough.  Wound Description (Comments): nonblanchable area what appears to be a healing stage 2 with dry flacky skin covering  Present on Admission: Yes    Physical Exam: Vital Signs Blood pressure (!) 139/59, pulse 64, temperature 98.2 F (36.8 C), temperature source Oral, resp. rate 16, height 4' 9.09" (1.45 m), weight 82.2 kg, SpO2 93 %. General: sitting up in bedside chair, wife at bedside, NAD HEENT: wearing eyeglasses, conjugate gaze  Neck: Supple without JVD or lymphadenopathy Heart: RRR Chest: CTA B/L- no W/R/R- good air movement Abdomen: Soft, NT, ND, (+)BS  Extremities: No clubbing, cyanosis, or edema. Pulses are 2+ Skin: Stage 2 pressure injury to bilateral buttocks and sacrum, so 3 wounds- sacrum is TINY-  Can barely see it/almost gone Neuro: Ox3 . Cranial nerves 2-12 are intact. Sensory exam is normal. Reflexes are 2+ in all 4's. Fine motor coordination is intact. No tremors. Motor function is grossly 5/5.  Musculoskeletal: L BKA- less area that's deep red- on anterior shin/below L knee- about the same redness but smaller area affected  small about of serous drainage on dressing- not seen on BKA- staples intact L knee- raw area from VAC tubing- healing.  Psych: calmer      Assessment/Plan: 1. Functional deficits secondary to left BKA which require 3+ hours per day of interdisciplinary therapy in a comprehensive inpatient rehab setting.  Physiatrist is providing close team supervision and 24 hour management of active medical problems listed below.  Physiatrist and rehab team continue to assess barriers to discharge/monitor patient progress toward functional and medical goals  Care Tool:  Bathing      Body parts bathed by patient: Right arm, Left arm, Chest, Abdomen, Front perineal area, Right upper leg, Left upper leg, Face   Body parts bathed by helper: Buttocks Body parts n/a: Right lower leg, Left lower leg   Bathing assist Assist Level: Minimal Assistance - Patient > 75%     Upper Body Dressing/Undressing Upper body dressing   What is the patient wearing?: Pull over shirt    Upper body assist Assist Level: Set up assist    Lower Body Dressing/Undressing Lower body dressing      What is the patient wearing?: Underwear/pull up     Lower body assist Assist for lower body dressing: Minimal Assistance - Patient > 75%     Toileting Toileting    Toileting assist Assist for toileting: Moderate Assistance - Patient 50 - 74%     Transfers Chair/bed transfer  Transfers assist  Chair/bed transfer activity did not occur: Safety/medical concerns  Chair/bed transfer assist level: Moderate Assistance - Patient 50 - 74% (squat pivot)     Locomotion Ambulation   Ambulation assist   Ambulation activity did not occur: Safety/medical concerns (new L BKA with R prosthesis, decreased strength/balance)          Walk 10 feet activity   Assist  Walk 10 feet activity did not occur: Safety/medical concerns        Walk 50 feet activity   Assist Walk 50 feet with 2 turns activity did not occur: Safety/medical concerns         Walk 150 feet activity   Assist Walk 150 feet activity did not occur: Safety/medical concerns         Walk 10 feet on uneven surface  activity   Assist Walk 10 feet on uneven surfaces activity did not occur: Safety/medical concerns         Wheelchair     Assist Will patient use wheelchair at discharge?: Yes Type of Wheelchair: Manual    Wheelchair assist level: Supervision/Verbal cueing Max wheelchair distance: 100'    Wheelchair 50 feet with 2 turns activity    Assist        Assist Level:  Supervision/Verbal cueing   Wheelchair 150 feet activity     Assist      Assist Level: Moderate Assistance - Patient 50 - 74%   Blood pressure (!) 139/59, pulse 64, temperature 98.2 F (36.8 C), temperature source Oral, resp. rate 16, height 4' 9.09" (1.45 m), weight 82.2 kg, SpO2 93 %.   Medical Problem List and Plan: 1.  Impaired mobility and ADLs secondary to left BKA             -patient may not shower  11/13- got Limb guard fixed- happy about that             -ELOS/Goals: modI 12-16 days  -Initial CIR evaluations today.  2.  Antithrombotics: -DVT/anticoagulation:  Pharmaceutical: Heparin             -  antiplatelet therapy: ASA 81 mg daily 3. Pain Management:  Oxycodone prn managing pain well.              --changed Gabapentin to 100mg  daily and 200mg  HS to better help with sleep at night, patient agreeable  11/13- pain controlled per pt- con't regimen 4. Mood: LCSW to follow for evaluation and support.              -antipsychotic agents: N/A 5. Neuropsych: This patient is capable of making decisions on his own behalf. 6. Skin/Wound Care: Routine pressure relief measures.              --Wound VAC to stay in place till 11/10  11/9--Stage 2 pressure injury to bilateral buttocks and sacrum- monitor daily and offload q2H. con't wound care   11/9- VAC off Wednesday  11/11- said pain better controlled without VAC on-   11/12- will start Keflex base don HD_ 500 mg BID due to deep red spot- could be bruise, could be spreading infection of skin-   11/13- looks smaller- con't keflex for now since getting better 7. Fluids/Electrolytes/Nutrition: Strict I/O.             -- Renal/CM diet with 1200 cc/FR 8. HTN : Monitor BP tid--continue Amlodipine, coreg and Furosemide 9. ESRD: HD - TTS with nephrology following for assist.              --Schedule HD at the end of the day to help with tolerance of therapy.   -Appreciate nephrology following.  10. T2DM with neuropathy/nephropathy: Hgb  A1c-6.5 and well controlled.  Reports was on Basaglar 12 units at bedtime with meal coverage/SSI.                            --Lantus decreased to 25 units on 11/05. May need to decrease further as activity increases. CBGs range from 98-195 currently.   11/8- BGs 62- 173- everything less 120 except x1- will reduce Lantus to 24 units  11/9- 1 BG 62 yesterday BEFORE lantus decrease- since then 77-174- con't regimen  11/11- BGs 87-159- controlled- con't regimen `11/12- BGs 73- 194- more spread out- will monitor, but might need to make changes  11/13- BGs 75- 186- usually more at night time- otherwise, under 150 except that 1 value. con't regimen 11.NICM s/p AICD: Monitor for symptoms with increase in mobility.  Continue Lipitor, coreg, ASA, fenofibrate and Lasix  12. Nausea/constipation: Has been intermittent. Question due to constipation--will change colace to Senna S and add pepcid for dyspepsia.   11/9- vomting this AM- no constipation that we can tell- con't zofran prn  11/11- LBM 4 days ago and no BM for day prior to that- ordered miralax, increased senokot- and ordered sorbitol prn  11/12- pt said better- will monitor  11/13- resolved 13. Overweight (BMI 25.46): Dietary consult ordered for education.  14. HTN with hypotension  11/8- will reduce Norvasc to 5 mg daily. - will start in AM  11/9- BP slightly elevated this AM 152/66- will monitor- might need to increase Norvasc to 7.5 mg daily.  11/11- BP 122/48- this AM- held BP meds today due to hypotension in HD.  15/ Hyperkalemia  11/11- K+ 6.3- per nephrology- baths in HD  11/12- K+ 5.0- much improved  LOS: 7 days A FACE TO FACE EVALUATION WAS PERFORMED  Jarion Hawthorne 11/23/2019, 1:53 PM

## 2019-11-24 ENCOUNTER — Inpatient Hospital Stay (HOSPITAL_COMMUNITY): Payer: Medicare Other | Admitting: Physical Therapy

## 2019-11-24 LAB — GLUCOSE, CAPILLARY
Glucose-Capillary: 110 mg/dL — ABNORMAL HIGH (ref 70–99)
Glucose-Capillary: 163 mg/dL — ABNORMAL HIGH (ref 70–99)
Glucose-Capillary: 141 mg/dL — ABNORMAL HIGH (ref 70–99)
Glucose-Capillary: 149 mg/dL — ABNORMAL HIGH (ref 70–99)

## 2019-11-24 NOTE — Progress Notes (Addendum)
Physical Therapy Note  Patient Details  Name: Anthony Bullock MRN: [id number] Date of Birth: [birth year] Today's Date: 11/24/2019    Recommending the following equipment to increase functional independence with mobility: ~Standard lightweight wheelchair 16x16 with L residual limb support   Patient measurements:    Hip Width: 15"    Seat Depth (-2"): 22"   W/c cushion: foam cushion 16x16   Patient will also require use of a hospital bed upon discharge home due to decreased ability to maneuver himself independenty in a standard bed and reliance on hospital bed features for safe and independent mobility.   Excell Seltzer, PT, DPT  11/24/2019, 12:20 PM

## 2019-11-24 NOTE — Progress Notes (Signed)
[facility] Progress Note   Subjective:   Patient seen and examined at bedside.  Required TPA dwell at start of HD yesterday due to [facility] not working well.  Ran well with BFR 400 post dwell.  No complaints today.   Objective Vitals:   11/23/19 1952 11/24/19 0500 11/24/19 0531 11/24/19 1408  BP: (!) 153/62  (!) 146/62 (!) 140/55  Pulse: 70  67 63  Resp: 18  16 18   Temp: 98.6 F (37 C)  97.8 F (36.6 C) (!) 97.4 F (36.3 C)  TempSrc: Oral  Oral   SpO2: 96%  99% 99%  Weight:  82.7 kg    Height:       Physical Exam General:WDWN male in NAD Heart:RRR Lungs:CTAB Abdomen:soft, NTND Extremities:b/l BKA, no stump edema Dialysis Access: TDC, LU AVF maturing   Filed Weights   11/23/19 0500 11/23/19 1800 11/24/19 0500  Weight: 82.2 kg 80.5 kg 82.7 kg    Intake/Output Summary (Last 24 hours) at 11/24/2019 1429 Last data filed at 11/24/2019 1257 Gross per 24 hour  Intake 948 ml  Output 2000 ml  Net -1052 ml    Additional Objective Labs: Basic Metabolic Panel: Recent Labs  Lab 11/19/19 1337 11/21/19 1320 11/23/19 1352  NA 134* 135 134*  K 6.3* 5.0 5.0  CL 92* 98 94*  CO2 27 25 27   GLUCOSE 144* 163* 176*  BUN 76* 66* 61*  CREATININE 13.97* 11.47* 10.66*  CALCIUM 8.6* 8.6* 8.9  PHOS 6.9* 6.3* 6.8*   Liver Function Tests: Recent Labs  Lab 11/19/19 1337 11/21/19 1320 11/23/19 1352  ALBUMIN 2.8* 2.7* 2.6*   CBC: Recent Labs  Lab 11/19/19 1337 11/21/19 1320 11/23/19 1352  WBC 6.8 8.7 5.9  HGB 10.1* 10.1* 10.1*  HCT 32.4* 33.0* 32.9*  MCV 91.8 91.7 92.2  PLT 159 171 165   CBG: Recent Labs  Lab 11/23/19 1141 11/23/19 1837 11/23/19 2055 11/24/19 0607 11/24/19 1130  GLUCAP 146* 194* 197* 110* 163*    Medications:  . (feeding supplement) PROSource Plus  30 mL Oral BID BM  . amLODipine  5 mg Oral Daily  . aspirin EC  81 mg Oral Daily  . atorvastatin  80 mg Oral QHS  . carvedilol  25 mg Oral BID WC  . cephALEXin  500 mg Oral Q12H  .  Chlorhexidine Gluconate Cloth  6 each Topical Q0600  . Chlorhexidine Gluconate Cloth  6 each Topical Q0600  . cinacalcet  30 mg Oral Q T,Th,Sa-HD  . doxercalciferol  2.5 mcg Intravenous Q T,Th,Sa-HD  . famotidine  10 mg Oral QHS  . fenofibrate  160 mg Oral Daily  . furosemide  120 mg Oral Daily  . gabapentin  100 mg Oral Daily  . gabapentin  200 mg Oral QHS  . heparin  5,000 Units Subcutaneous Q8H  . insulin aspart  0-5 Units Subcutaneous QHS  . insulin aspart  0-9 Units Subcutaneous TID WC  . insulin aspart  2 Units Subcutaneous TID WC  . insulin glargine  24 Units Subcutaneous QHS  . lanthanum  2,000 mg Oral TID with meals  . lanthanum  500 mg Oral With snacks  . loratadine  10 mg Oral Daily  . multivitamin  1 tablet Oral QHS  . senna-docusate  2 tablet Oral QPC supper    [facility]600 2.2/5 bath TDC RIJ 83kg  -Hep 1000units+ 500units/hr - hect 2.5 ug tiw - epo 4000unitsIV tiw - sensipar 30mg po tiw  Assessment/Plan: 1.PAD- s/p L BKA on 11/3 by Dr. Sharol Given. Now in CIR. 2. ESRD- on HD TTS. Last K 5.0 - prior to HD.  Jeddo functioned well after tpa yesterday. Next HD on 11/16. Likely can start back low tight heparin.  3. Anemiaof CKD-Hgb 10.1. No ESA indicated at this time. 4. Secondary hyperparathyroidism- Ca at goal. Phos elevated. Continue binders, sensipar and hectorol. 5.HTN/volume- BP well controlled. Does not appear volume overloaded. Will need lower EDW on discharge.  6. Nutrition- Renal diet w/fluid restrictions. Alb low, continue protein supplements/renal vits.   Jen Mow, PA-C [facility] 11/24/2019,2:29 PM  LOS: 8 days

## 2019-11-24 NOTE — Progress Notes (Signed)
Physical Therapy Weekly Progress Note  Patient Details  Name: Anthony Bullock MRN: [id number] Date of Birth: July 25, 1960  Beginning of progress report period: November 17, 2019 End of progress report period: November 24, 2019  Today's Date: 11/24/2019 PT Individual Time: 1015-1110 PT Individual Time Calculation (min): 55 min   Patient has met 3 of 3 short term goals.  Pt is making good progress towards therapy goals. He is currently CGA for bed mobility, CGA for squat pivot transfers, min A for sit to stand to RW and stand pivot transfers with RW, Supervision for w/c mobility up to 140 ft with use of BUE. Pt demonstrates improved safety awareness as well as decreased assist needed with functional mobility. Patient and his wife have been very involved during therapy sessions with regards to assessing home setup and determining pt's needs upon d/c home. Hands-on family education will take place this week prior to pt d/c home.  Patient continues to demonstrate the following deficits muscle weakness, decreased cardiorespiratoy endurance and decreased sitting balance, decreased standing balance, decreased postural control and decreased balance strategies and therefore will continue to benefit from skilled PT intervention to increase functional independence with mobility.  Patient progressing toward long term goals..  Continue plan of care.  PT Short Term Goals Week 1:  PT Short Term Goal 1 (Week 1): Patient will perform basic transfers with CGA. PT Short Term Goal 1 - Progress (Week 1): Met PT Short Term Goal 2 (Week 1): [name] will perform sit to/from stand with min A using LRAD. PT Short Term Goal 2 - Progress (Week 1): Met PT Short Term Goal 3 (Week 1): Patient will demonstrate dynamic sitting balance with supervision >5 min. PT Short Term Goal 3 - Progress (Week 1): Met Week 2:  PT Short Term Goal 1 (Week 2): =LTG due to ELOS  Skilled Therapeutic Interventions/Progress Updates:    Pt received  seated in w/c in room, agreeable to PT session. No complaints of pain at rest, reports pain in residual limb by end of session and requests pain medication from nursing, not rated. Manual w/c propulsion with use of BUE at Supervision level x 100 ft, x 120 ft, x 140 ft. Squat pivot transfer w/c to/from mat table with CGA overall. Pt demos improved understanding of ability to maneuver w/c parts and set w/c up safely for squat pivot transfers. Sit to stand with CGA to min A from elevated mat table to RW x 5 reps with focus on weight shift and safe hand placement. Stand pivot transfer w/c to/from mat table with RW and min A, increased time and encouragement required due to patient fear of falling. Per pt and wife report pt will be transferring in/out of a [place]. Car transfer with RW and min A for stand pivot transfer w/c to/from car at simulation truck height. Discussed practicing transfer on pt's actual vehicle next session. Trial use of 16x16 w/c due to narrow hip width and pt and wife concerns about w/c accessibility in the home. Pt fits into 16x16 w/c comfortably. Pt exhibits overall decreased assist level needed for mobility as well as improved endurance this date. Pt left seated in w/c in room with needs in reach, wife present at end of session.  Therapy Documentation Precautions:  Precautions Precautions: Fall, Other (comment) Precaution Comments: L BKA wound vac and limb guard; h/o R BKA (prosthetic in room) Restrictions Weight Bearing Restrictions: Yes LLE Weight Bearing: Non weight bearing Other Position/Activity Restrictions: NWB L LE  Therapy/Group: Individual Therapy   Excell Seltzer, PT, DPT  11/24/2019, 12:19 PM

## 2019-11-24 NOTE — Progress Notes (Signed)
[place] PHYSICAL MEDICINE & REHABILITATION PROGRESS NOTE   Subjective/Complaints:   Pt reports good to see wife yesterday.  Said he thinks leg feels better/ new BKA on L.  Said no problems with bowels.  ROS:  Pt denies SOB, abd pain, CP, N/V/C/D, and vision changes   Objective:   No results found. Recent Labs    11/23/19 1352  WBC 5.9  HGB 10.1*  HCT 32.9*  PLT 165   Recent Labs    11/23/19 1352  NA 134*  K 5.0  CL 94*  CO2 27  GLUCOSE 176*  BUN 61*  CREATININE 10.66*  CALCIUM 8.9    Intake/Output Summary (Last 24 hours) at 11/24/2019 1423 Last data filed at 11/24/2019 1257 Gross per 24 hour  Intake 948 ml  Output 2000 ml  Net -1052 ml     Pressure Injury 09/03/19 Sacrum Medial Stage 2 -  Partial thickness loss of dermis presenting as a shallow open injury with a red, pink wound bed without slough. (Active)  09/03/19 1400  Location: Sacrum  Location Orientation: Medial  Staging: Stage 2 -  Partial thickness loss of dermis presenting as a shallow open injury with a red, pink wound bed without slough.  Wound Description (Comments):   Present on Admission: Yes     Pressure Injury 11/16/19 Buttocks Left Stage 2 -  Partial thickness loss of dermis presenting as a shallow open injury with a red, pink wound bed without slough. dry shiny nonblanchable area what appears to be a healing stage 2 (Active)  11/16/19 1435  Location: Buttocks  Location Orientation: Left  Staging: Stage 2 -  Partial thickness loss of dermis presenting as a shallow open injury with a red, pink wound bed without slough.  Wound Description (Comments): dry shiny nonblanchable area what appears to be a healing stage 2  Present on Admission: Yes     Pressure Injury 11/16/19 Buttocks Right Stage 2 -  Partial thickness loss of dermis presenting as a shallow open injury with a red, pink wound bed without slough. nonblanchable area what appears to be a healing stage 2 with dry flacky skin  covering (Active)  11/16/19 1435  Location: Buttocks  Location Orientation: Right  Staging: Stage 2 -  Partial thickness loss of dermis presenting as a shallow open injury with a red, pink wound bed without slough.  Wound Description (Comments): nonblanchable area what appears to be a healing stage 2 with dry flacky skin covering  Present on Admission: Yes    Physical Exam: Vital Signs Blood pressure (!) 140/55, pulse 63, temperature (!) 97.4 F (36.3 C), resp. rate 18, height 4' 9.09" (1.45 m), weight 82.7 kg, SpO2 99 %. General: sitting up in bedside chair, appropriate, able to unwrap L BKA with no issues, NAD HEENT: wearing eyeglasses, conjugate gaze  Neck: Supple without JVD or lymphadenopathy Heart: RRR Chest: CTA B/L- no W/R/R- good air movement Abdomen: Soft, NT, ND, (+)BS  Extremities: No clubbing, cyanosis, or edema. Pulses are 2+ Skin: Stage 2 pressure injury to bilateral buttocks and sacrum, so 3 wounds- sacrum is TINY-  Can barely see it/almost gone Neuro: Ox3 . Cranial nerves 2-12 are intact. Sensory exam is normal. Reflexes are 2+ in all 4's. Fine motor coordination is intact. No tremors. Motor function is grossly 5/5.  Musculoskeletal: L BKA- less redness/less deep red on anterior aspect of L BKA  dried blood on dressing, but no drainage; staples intact L knee- raw area from VAC tubing- healing-  looks much better today.       Assessment/Plan: 1. Functional deficits secondary to left BKA which require 3+ hours per day of interdisciplinary therapy in a comprehensive inpatient rehab setting.  Physiatrist is providing close team supervision and 24 hour management of active medical problems listed below.  Physiatrist and rehab team continue to assess barriers to discharge/monitor patient progress toward functional and medical goals  Care Tool:  Bathing    Body parts bathed by patient: Right arm, Left arm, Chest, Abdomen, Front perineal area, Right upper leg, Left  upper leg, Face   Body parts bathed by helper: Buttocks Body parts n/a: Right lower leg, Left lower leg   Bathing assist Assist Level: Minimal Assistance - Patient > 75%     Upper Body Dressing/Undressing Upper body dressing   What is the patient wearing?: Pull over shirt    Upper body assist Assist Level: Set up assist    Lower Body Dressing/Undressing Lower body dressing      What is the patient wearing?: Underwear/pull up     Lower body assist Assist for lower body dressing: Minimal Assistance - Patient > 75%     Toileting Toileting    Toileting assist Assist for toileting: Moderate Assistance - Patient 50 - 74%     Transfers Chair/bed transfer  Transfers assist  Chair/bed transfer activity did not occur: Safety/medical concerns  Chair/bed transfer assist level: Contact Guard/Touching assist     Locomotion Ambulation   Ambulation assist   Ambulation activity did not occur: Safety/medical concerns (new L BKA with R prosthesis, decreased strength/balance)          Walk 10 feet activity   Assist  Walk 10 feet activity did not occur: Safety/medical concerns        Walk 50 feet activity   Assist Walk 50 feet with 2 turns activity did not occur: Safety/medical concerns         Walk 150 feet activity   Assist Walk 150 feet activity did not occur: Safety/medical concerns         Walk 10 feet on uneven surface  activity   Assist Walk 10 feet on uneven surfaces activity did not occur: Safety/medical concerns         Wheelchair     Assist Will patient use wheelchair at discharge?: Yes Type of Wheelchair: Manual    Wheelchair assist level: Supervision/Verbal cueing Max wheelchair distance: 140'    Wheelchair 50 feet with 2 turns activity    Assist        Assist Level: Supervision/Verbal cueing   Wheelchair 150 feet activity     Assist      Assist Level: Moderate Assistance - Patient 50 - 74%   Blood  pressure (!) 140/55, pulse 63, temperature (!) 97.4 F (36.3 C), resp. rate 18, height 4' 9.09" (1.45 m), weight 82.7 kg, SpO2 99 %.   Medical Problem List and Plan: 1.  Impaired mobility and ADLs secondary to left BKA             -patient may not shower  11/13- got Limb guard fixed- happy about that             -ELOS/Goals: modI 12-16 days  -Initial CIR evaluations today.  2.  Antithrombotics: -DVT/anticoagulation:  Pharmaceutical: Heparin             -antiplatelet therapy: ASA 81 mg daily 3. Pain Management:  Oxycodone prn managing pain well.              --  changed Gabapentin to 100mg  daily and 200mg  HS to better help with sleep at night, patient agreeable  11/14- pain controlled- con't regimen 4. Mood: LCSW to follow for evaluation and support.              -antipsychotic agents: N/A 5. Neuropsych: This patient is capable of making decisions on his own behalf. 6. Skin/Wound Care: Routine pressure relief measures.              --Wound VAC to stay in place till 11/10  11/9--Stage 2 pressure injury to bilateral buttocks and sacrum- monitor daily and offload q2H. con't wound care   11/9- VAC off Wednesday  11/11- said pain better controlled without VAC on-   11/12- will start Keflex base don HD_ 500 mg BID due to deep red spot- could be bruise, could be spreading infection of skin-   11/13- looks smaller- con't keflex for now since getting better  11/14- looking less red/smaller area affected daily- con't keflex 7. Fluids/Electrolytes/Nutrition: Strict I/O.             -- Renal/CM diet with 1200 cc/FR 8. HTN : Monitor BP tid--continue Amlodipine, coreg and Furosemide 9. ESRD: HD - TTS with nephrology following for assist.              --Schedule HD at the end of the day to help with tolerance of therapy.   -Appreciate nephrology following.  10. T2DM with neuropathy/nephropathy: Hgb A1c-6.5 and well controlled.  Reports was on Basaglar 12 units at bedtime with meal coverage/SSI.                             --Lantus decreased to 25 units on 11/05. May need to decrease further as activity increases. CBGs range from 98-195 currently.   11/8- BGs 62- 173- everything less 120 except x1- will reduce Lantus to 24 units  11/9- 1 BG 62 yesterday BEFORE lantus decrease- since then 77-174- con't regimen  11/11- BGs 87-159- controlled- con't regimen `11/12- BGs 73- 194- more spread out- will monitor, but might need to make changes  11/13- BGs 75- 186- usually more at night time- otherwise, under 150 except that 1 value. con't regimen  11/14- BGs a little higher- 110-197- con't regimen since new- wife could have brought in snacks? 11.NICM s/p AICD: Monitor for symptoms with increase in mobility.  Continue Lipitor, coreg, ASA, fenofibrate and Lasix  12. Nausea/constipation: Has been intermittent. Question due to constipation--will change colace to Senna S and add pepcid for dyspepsia.   11/9- vomting this AM- no constipation that we can tell- con't zofran prn  11/11- LBM 4 days ago and no BM for day prior to that- ordered miralax, increased senokot- and ordered sorbitol prn  11/12- pt said better- will monitor  11/14- resolved 13. Overweight (BMI 25.46): Dietary consult ordered for education.  14. HTN with hypotension  11/8- will reduce Norvasc to 5 mg daily. - will start in AM  11/9- BP slightly elevated this AM 152/66- will monitor- might need to increase Norvasc to 7.5 mg daily.  11/11- BP 122/48- this AM- held BP meds today due to hypotension in HD.  15/ Hyperkalemia  11/11- K+ 6.3- per nephrology- baths in HD  11/12- K+ 5.0- much improved  LOS: 8 days A FACE TO FACE EVALUATION WAS PERFORMED  Pearlina Friedly 11/24/2019, 2:23 PM

## 2019-11-24 NOTE — Progress Notes (Signed)
Pt in w/c, notified by NT that pt refusing seat/chair alarm. Discussed with pt and educated on use. Pt still refused. All needs met, call bell in reach.

## 2019-11-25 ENCOUNTER — Encounter (HOSPITAL_COMMUNITY): Payer: Medicare Other

## 2019-11-25 ENCOUNTER — Ambulatory Visit (HOSPITAL_COMMUNITY): Payer: Medicare Other | Admitting: Physical Therapy

## 2019-11-25 ENCOUNTER — Inpatient Hospital Stay (HOSPITAL_COMMUNITY): Payer: Medicare Other

## 2019-11-25 LAB — GLUCOSE, CAPILLARY
Glucose-Capillary: 133 mg/dL — ABNORMAL HIGH (ref 70–99)
Glucose-Capillary: 93 mg/dL (ref 70–99)
Glucose-Capillary: 109 mg/dL — ABNORMAL HIGH (ref 70–99)
Glucose-Capillary: 148 mg/dL — ABNORMAL HIGH (ref 70–99)

## 2019-11-25 NOTE — Progress Notes (Signed)
Occupational Therapy Session Note  Patient Details  Name: DEMONTAY GRANTHAM MRN: [id number] Date of Birth: Apr 25, 1960  Today's Date: 11/25/2019 OT Individual Time: 0700-0810 OT Individual Time Calculation (min): 70 min    Short Term Goals: Week 2:  OT Short Term Goal 1 (Week 2): STG=LTG secondary to ELOS  Skilled Therapeutic Interventions/Progress Updates:    Pt resting in w/c upon arrival, dressed and ready for therapy. Pt propelled w/c to nursing station. Pt transitioned to [place] and practiced squat pivot transfers for approximate bed height. Pt also practiced squat pivot transfers EOM<>drop arm BSC with supervision. Clothing management with lateral leans. BUE therex: Scifit 7 mins and 5 mins random level 3 in opposite directions. BUE therex with 5# bar-chest presses, triceps extension, rowing 15X3. W/c mobility in home envirionment with supervisoin. Pt propelled w/c to nursing station. Pt returned to room and remained in w/c with all needs within reach.  Therapy Documentation Precautions:  Precautions Precautions: Fall, Other (comment) Precaution Comments: L BKA wound vac and limb guard; h/o R BKA (prosthetic in room) Restrictions Weight Bearing Restrictions: Yes LLE Weight Bearing: Non weight bearing Other Position/Activity Restrictions: NWB L LE Pain:  I'm ok"   Therapy/Group: Individual Therapy  Leroy Libman 11/25/2019, 8:12 AM

## 2019-11-25 NOTE — Progress Notes (Signed)
Nutrition Follow-up  DOCUMENTATION CODES:   Non-severe (moderate) malnutrition in context of chronic illness  INTERVENTION:   - Continue renal MVI daily  - Continue ProSource Plus 30 ml po BID, each supplement provides 100 kcal and 15 grams of protein  - Continue HS snack daily  - Family bringing in some appropriate meals from home to improve PO intake  - Encourage adequate PO intake  NUTRITION DIAGNOSIS:   Moderate Malnutrition related to chronic illness (ESRD on HD, CHF, COPD) as evidenced by moderate fat depletion, moderate muscle depletion.  Ongoing  GOAL:   Patient will meet greater than or equal to 90% of their needs  Progressing  MONITOR:   PO intake, Supplement acceptance, Labs, Weight trends, Skin, I & O's  REASON FOR ASSESSMENT:   Consult Diet education  ASSESSMENT:   59 year old male with PMH of T2DM with nephropathy/neuropathy, HTN, ESRD on HD, NICM s/p AICD, CHF, COPD, s/p right BKA, left foot ulcer with osteomyelitis s/p toe amputation with poor wound healing and gangrenous changes. Pt was admitted on 11/03 for left BKA. Admitted to CIR on 11/06.  Noted planned d/c date of 11/19.  Last HD was on 11/13 with 2000 ml net UF.  Spoke with pt and wife at bedside. Pt and wife report that pt's appetite and PO intake have improved now that he is no longer taking pain medication other than tylenol. Pt is taking the ProSource twice daily and tolerating it well. Pt reports some episodes of N/V last week but this has since resolved.   Meal Completion: 90-100%  Medications reviewed and include: ProSource plus BID, sensipar, hectorol, pepcid, fenofibrate, lasix, SSI, Novolog 2 units q meals, lantus 24 units daily, fosrenol TID, rena-vit, senna  Labs reviewed: phosphorus 6.8, sodium 134 CBG's: 93-149 x 24 hours  Diet Order:   Diet Order            Diet renal/carb modified with fluid restriction Diet-HS Snack? Nothing; Fluid restriction: 1200 mL Fluid; Room  service appropriate? Yes; Fluid consistency: Thin  Diet effective now                 EDUCATION NEEDS:   Education needs have been addressed  Skin:  Skin Assessment: Skin Integrity Issues: Stage II: bilateral buttocks Incisions: left leg s/p BKA  Last BM:  11/24/19  Height:   Ht Readings from Last 1 Encounters:  11/16/19 4' 9.09" (1.45 m)    Weight:   Wt Readings from Last 1 Encounters:  11/25/19 84.2 kg    BMI:  Body mass index is 40.05 kg/m.  Estimated Nutritional Needs:   Kcal:  2200-2400  Protein:  110-130 grams  Fluid:  1000 ml + UOP    Gaynell Face, MS, RD, LDN Inpatient Clinical Dietitian Please see [facility] for contact information.

## 2019-11-25 NOTE — Progress Notes (Signed)
[facility] Progress Note   Subjective:   Patient seen and examined in room, no c/o today  Objective Vitals:   11/24/19 1900 11/25/19 0458 11/25/19 0500 11/25/19 0834  BP: (!) 152/56 (!) 174/66  131/62  Pulse: 70 68    Resp: 16 17    Temp: 97.9 F (36.6 C) 98.3 F (36.8 C)    TempSrc:      SpO2: 99% 99%    Weight:   84.2 kg   Height:       Physical Exam General:WDWN male in NAD Heart:RRR Lungs:CTAB Abdomen:soft, NTND Extremities:b/l BKA, no stump edema Dialysis Access: TDC, LU AVF maturing   Filed Weights   11/23/19 1800 11/24/19 0500 11/25/19 0500  Weight: 80.5 kg 82.7 kg 84.2 kg    Intake/Output Summary (Last 24 hours) at 11/25/2019 1525 Last data filed at 11/25/2019 1242 Gross per 24 hour  Intake 700 ml  Output --  Net 700 ml    Additional Objective Labs: Basic Metabolic Panel: Recent Labs  Lab 11/19/19 1337 11/21/19 1320 11/23/19 1352  NA 134* 135 134*  K 6.3* 5.0 5.0  CL 92* 98 94*  CO2 27 25 27   GLUCOSE 144* 163* 176*  BUN 76* 66* 61*  CREATININE 13.97* 11.47* 10.66*  CALCIUM 8.6* 8.6* 8.9  PHOS 6.9* 6.3* 6.8*   Liver Function Tests: Recent Labs  Lab 11/19/19 1337 11/21/19 1320 11/23/19 1352  ALBUMIN 2.8* 2.7* 2.6*   CBC: Recent Labs  Lab 11/19/19 1337 11/21/19 1320 11/23/19 1352  WBC 6.8 8.7 5.9  HGB 10.1* 10.1* 10.1*  HCT 32.4* 33.0* 32.9*  MCV 91.8 91.7 92.2  PLT 159 171 165   CBG: Recent Labs  Lab 11/24/19 1130 11/24/19 1646 11/24/19 2055 11/25/19 0839 11/25/19 1143  GLUCAP 163* 141* 149* 133* 93    Medications:  . (feeding supplement) PROSource Plus  30 mL Oral BID BM  . amLODipine  5 mg Oral Daily  . aspirin EC  81 mg Oral Daily  . atorvastatin  80 mg Oral QHS  . carvedilol  25 mg Oral BID WC  . cephALEXin  500 mg Oral Q12H  . Chlorhexidine Gluconate Cloth  6 each Topical Q0600  . Chlorhexidine Gluconate Cloth  6 each Topical Q0600  . cinacalcet  30 mg Oral Q T,Th,Sa-HD  . doxercalciferol   2.5 mcg Intravenous Q T,Th,Sa-HD  . famotidine  10 mg Oral QHS  . fenofibrate  160 mg Oral Daily  . furosemide  120 mg Oral Daily  . gabapentin  100 mg Oral Daily  . gabapentin  200 mg Oral QHS  . heparin  5,000 Units Subcutaneous Q8H  . insulin aspart  0-5 Units Subcutaneous QHS  . insulin aspart  0-9 Units Subcutaneous TID WC  . insulin aspart  2 Units Subcutaneous TID WC  . insulin glargine  24 Units Subcutaneous QHS  . lanthanum  2,000 mg Oral TID with meals  . lanthanum  500 mg Oral With snacks  . loratadine  10 mg Oral Daily  . multivitamin  1 tablet Oral QHS  . senna-docusate  2 tablet Oral QPC supper    [facility]600 2.2/5 bath TDC RIJ 83kg  -Hep 1000units+ 500units/hr - hect 2.5 ug tiw - epo 4000unitsIV tiw - sensipar 30mg po tiw  Assessment/Plan: 1.PAD- s/p L BKA on 11/3 by Dr. Sharol Given. Now in CIR. 2. ESRD- on HD TTS. Last K 5.0 - prior to HD.  [place] functioned well after  tpa last HD. Next HD on 11/16. Will start back his regular HD heparin.  3. Anemiaof CKD-Hgb 10.1. No ESA indicated at this time. 4. Secondary hyperparathyroidism- Ca at goal. Phos elevated. Continue binders, sensipar and hectorol. 5.HTN/volume- BP well controlled. Does not appear volume overloaded. Will need lower EDW on discharge.  In pt wt's are not right prob due to his prosthetic.  6. Nutrition- Renal diet w/fluid restrictions. Alb low, continue protein supplements/renal vits.   Kelly Splinter, MD 11/25/2019, 3:26 PM

## 2019-11-25 NOTE — Progress Notes (Signed)
Patient ID: Anthony Bullock, male   DOB: 10-20-1960, 59 y.o.   MRN: [id number]   Per OT, pt has stated he does not need a padded DABSC. SW ordered hospital bed as recommended. Order sen to [place] via parachute.   Loralee Pacas, MSW, [place] Office: [phone] Cell: [phone] Fax: [phone]

## 2019-11-25 NOTE — Progress Notes (Signed)
Occupational Therapy Session Note  Patient Details  Name: Anthony Bullock MRN: [id number] Date of Birth: 1960/01/23  Today's Date: 11/25/2019 OT Individual Time: 1300-1355 OT Individual Time Calculation (min): 55 min    Short Term Goals: Week 2:  OT Short Term Goal 1 (Week 2): STG=LTG secondary to ELOS  Skilled Therapeutic Interventions/Progress Updates:    Pt resting in w/c upon arrival with wife present for education (See Education flowsheet). Pt demonstrated squat pivot transfers w/c<>bed, bed<>drop arm BSC with supervision. Velcro provided for limb guard and fastened to limb guard. Discussed home arrangement. Discussed home safety. Discussed assist levels. Pt and wife pleased with progress and anticipating discharge home on 11/19. Pt remained in w/c with wife present.   Therapy Documentation Precautions:  Precautions Precautions: Fall, Other (comment) Precaution Comments: L BKA wound vac and limb guard; h/o R BKA (prosthetic in room) Restrictions Weight Bearing Restrictions: Yes LLE Weight Bearing: Non weight bearing Other Position/Activity Restrictions: NWB L LE   Pain:  Pt denies pain this afternoon   Therapy/Group: Individual Therapy  Leroy Libman 11/25/2019, 2:50 PM

## 2019-11-25 NOTE — Progress Notes (Signed)
Physical Therapy Session Note  Patient Details  Name: Anthony Bullock MRN: [id number] Date of Birth: 1960/06/22  Today's Date: 11/25/2019 PT Individual Time: 2671-2458 PT Individual Time Calculation (min): 55 min   Short Term Goals: Week 2:  PT Short Term Goal 1 (Week 2): =LTG due to ELOS  Skilled Therapeutic Interventions/Progress Updates:    Pt received seated in w/c in room, agreeable to PT session. No complaints of pain. Pt's wife Manuela Schwartz present for family education session. Took pt outside to his truck as he will d/c home in this vehicle. Provided demonstration of how to perform safe stand pivot transfer w/c to/from truck with use of RW. Then assisted pt with performing transfer at min A level with cues to get buttocks fully scooted back on seat. Pt and his wife then able to perform return demo of transfer. Pt and his wife demonstrate good insight into safe transfer technique and feel comfortable performing transfer. Discussed equipment pt will d/c home with (w/c, hospital bed) and pt's current assist level overall. Manuela Schwartz with no further questions and feels comfortable with pt's current level of assist. Squat pivot transfer w/c to/from mat table at Supervision level. Pt is independent for management of w/c parts. Seated core strengthening with 2 kg weighted ball: punch-outs, PNF diagonals L/R x2 x 10 reps each. Manual w/c propulsion up to 150 ft with use of BUE at Supervision level. Pt left seated in w/c in room with needs in reach, wife present at end of session.  Therapy Documentation Precautions:  Precautions Precautions: Fall, Other (comment) Precaution Comments: L BKA wound vac and limb guard; h/o R BKA (prosthetic in room) Restrictions Weight Bearing Restrictions: Yes LLE Weight Bearing: Non weight bearing Other Position/Activity Restrictions: NWB L LE   Therapy/Group: Individual Therapy   Excell Seltzer, PT, DPT  11/25/2019, 3:45 PM

## 2019-11-25 NOTE — Progress Notes (Signed)
PHYSICAL MEDICINE & REHABILITATION PROGRESS NOTE   Subjective/Complaints:  Had stump pain but no phantom pain Left LE last noc, pain controlled this am    ROS:  Pt denies SOB, abd pain, CP, N/V/C/D, and vision changes   Objective:   No results found. Recent Labs    11/23/19 1352  WBC 5.9  HGB 10.1*  HCT 32.9*  PLT 165   Recent Labs    11/23/19 1352  NA 134*  K 5.0  CL 94*  CO2 27  GLUCOSE 176*  BUN 61*  CREATININE 10.66*  CALCIUM 8.9    Intake/Output Summary (Last 24 hours) at 11/25/2019 1003 Last data filed at 11/25/2019 0841 Gross per 24 hour  Intake 948 ml  Output --  Net 948 ml     Pressure Injury 09/03/19 Sacrum Medial Stage 2 -  Partial thickness loss of dermis presenting as a shallow open injury with a red, pink wound bed without slough. (Active)  09/03/19 1400  Location: Sacrum  Location Orientation: Medial  Staging: Stage 2 -  Partial thickness loss of dermis presenting as a shallow open injury with a red, pink wound bed without slough.  Wound Description (Comments):   Present on Admission: Yes     Pressure Injury 11/16/19 Buttocks Left Stage 2 -  Partial thickness loss of dermis presenting as a shallow open injury with a red, pink wound bed without slough. dry shiny nonblanchable area what appears to be a healing stage 2 (Active)  11/16/19 1435  Location: Buttocks  Location Orientation: Left  Staging: Stage 2 -  Partial thickness loss of dermis presenting as a shallow open injury with a red, pink wound bed without slough.  Wound Description (Comments): dry shiny nonblanchable area what appears to be a healing stage 2  Present on Admission: Yes     Pressure Injury 11/16/19 Buttocks Right Stage 2 -  Partial thickness loss of dermis presenting as a shallow open injury with a red, pink wound bed without slough. nonblanchable area what appears to be a healing stage 2 with dry flacky skin covering (Active)  11/16/19 1435  Location:  Buttocks  Location Orientation: Right  Staging: Stage 2 -  Partial thickness loss of dermis presenting as a shallow open injury with a red, pink wound bed without slough.  Wound Description (Comments): nonblanchable area what appears to be a healing stage 2 with dry flacky skin covering  Present on Admission: Yes    Physical Exam: Vital Signs Blood pressure 131/62, pulse 68, temperature 98.3 F (36.8 C), resp. rate 17, height 4' 9.09" (1.45 m), weight 84.2 kg, SpO2 99 %.  General: No acute distress Mood and affect are appropriate Heart: Regular rate and rhythm no rubs murmurs or extra sounds Lungs: Clear to auscultation, breathing unlabored, no rales or wheezes Abdomen: Positive bowel sounds, soft nontender to palpation, nondistended Extremities: No clubbing, cyanosis, or edema Skin: No evidence of breakdown, no evidence of rash  Left BKA POD #12  Neurologic: Cranial nerves II through XII intact, motor strength is 5/5 in bilateral deltoid, bicep, tricep, grip, hip flexor (s/p B BKA)   ,Musculoskeletal: Full range of motion in all 4 extremities. No joint swelling  Bilaterl BKA         Assessment/Plan: 1. Functional deficits secondary to left BKA which require 3+ hours per day of interdisciplinary therapy in a comprehensive inpatient rehab setting.  Physiatrist is providing close team supervision and 24 hour management of active medical problems listed below.  Physiatrist and rehab team continue to assess barriers to discharge/monitor patient progress toward functional and medical goals  Care Tool:  Bathing    Body parts bathed by patient: Right arm, Left arm, Chest, Abdomen, Front perineal area, Right upper leg, Left upper leg, Face   Body parts bathed by helper: Buttocks Body parts n/a: Right lower leg, Left lower leg   Bathing assist Assist Level: Minimal Assistance - Patient > 75%     Upper Body Dressing/Undressing Upper body dressing   What is the patient  wearing?: Pull over shirt    Upper body assist Assist Level: Set up assist    Lower Body Dressing/Undressing Lower body dressing      What is the patient wearing?: Underwear/pull up     Lower body assist Assist for lower body dressing: Minimal Assistance - Patient > 75%     Toileting Toileting    Toileting assist Assist for toileting: Moderate Assistance - Patient 50 - 74%     Transfers Chair/bed transfer  Transfers assist  Chair/bed transfer activity did not occur: Safety/medical concerns  Chair/bed transfer assist level: Contact Guard/Touching assist     Locomotion Ambulation   Ambulation assist   Ambulation activity did not occur: Safety/medical concerns (new L BKA with R prosthesis, decreased strength/balance)          Walk 10 feet activity   Assist  Walk 10 feet activity did not occur: Safety/medical concerns        Walk 50 feet activity   Assist Walk 50 feet with 2 turns activity did not occur: Safety/medical concerns         Walk 150 feet activity   Assist Walk 150 feet activity did not occur: Safety/medical concerns         Walk 10 feet on uneven surface  activity   Assist Walk 10 feet on uneven surfaces activity did not occur: Safety/medical concerns         Wheelchair     Assist Will patient use wheelchair at discharge?: Yes Type of Wheelchair: Manual    Wheelchair assist level: Supervision/Verbal cueing Max wheelchair distance: 140'    Wheelchair 50 feet with 2 turns activity    Assist        Assist Level: Supervision/Verbal cueing   Wheelchair 150 feet activity     Assist      Assist Level: Moderate Assistance - Patient 50 - 74%   Blood pressure 131/62, pulse 68, temperature 98.3 F (36.8 C), resp. rate 17, height 4' 9.09" (1.45 m), weight 84.2 kg, SpO2 99 %.   Medical Problem List and Plan: 1.  Impaired mobility and ADLs secondary to left BKA             -patient may not shower  11/13-  got Limb guard fixed- happy about that             -ELOS/Goals: tent dc 11/19  Team conf in am  2.  Antithrombotics: -DVT/anticoagulation:  Pharmaceutical: Heparin             -antiplatelet therapy: ASA 81 mg daily 3. Pain Management:  Oxycodone prn managing pain well.              --changed Gabapentin to 100mg  daily and 200mg  HS to better help with sleep at night, patient agreeable  11/14- pain controlled- con't regimen 4. Mood: LCSW to follow for evaluation and support.              -antipsychotic agents:  N/A 5. Neuropsych: This patient is capable of making decisions on his own behalf. 6. Skin/Wound Care: Routine pressure relief measures.              --Wound VAC to stay in place till 11/10  11/9--Stage 2 pressure injury to bilateral buttocks and sacrum- monitor daily and offload q2H. con't wound care   11/9- VAC off Wednesday  11/11- said pain better controlled without VAC on-   11/12- will start Keflex base don HD_ 500 mg BID due to deep red spot- could be bruise, could be spreading infection of skin-   11/13- looks smaller- con't keflex for now since getting better  11/14- looking less red/smaller area affected daily- con't keflex 7. Fluids/Electrolytes/Nutrition: Strict I/O.             -- Renal/CM diet with 1200 cc/FR 8. HTN : Monitor BP tid--continue Amlodipine, coreg and Furosemide 9. ESRD: HD - TTS with nephrology following for assist.              --Schedule HD at the end of the day to help with tolerance of therapy.   -Appreciate nephrology following.  10. T2DM with neuropathy/nephropathy: Hgb A1c-6.5 and well controlled.  Reports was on Basaglar 12 units at bedtime with meal coverage/SSI.                            --Lantus decreased to 25 units on 11/05. May need to decrease further as activity increases. CBG (last 3)  Recent Labs    11/24/19 1646 11/24/19 2055 11/25/19 0839  GLUCAP 141* 149* 133*  good in hospital control  11.NICM s/p AICD: Monitor for symptoms  with increase in mobility.  Continue Lipitor, coreg, ASA, fenofibrate and Lasix  12. Nausea/constipation: Has been intermittent. Question due to constipation--will change colace to Senna S and add pepcid for dyspepsia.   11/9- vomting this AM- no constipation that we can tell- con't zofran prn  11/11- LBM 4 days ago and no BM for day prior to that- ordered miralax, increased senokot- and ordered sorbitol prn  11/12- pt said better- will monitor  11/14- resolved 13. Overweight (BMI 25.46): Dietary consult ordered for education.  14. HTN with hypotension  11/8- will reduce Norvasc to 5 mg daily. - will start in AM  11/9- BP slightly elevated this AM 152/66- will monitor- might need to increase Norvasc to 7.5 mg daily.  11/11- BP 122/48- this AM- held BP meds today due to hypotension in HD.  15/ Hyperkalemia  11/11- K+ 6.3- per nephrology- baths in HD  11/13- K+ 5.0-   LOS: 9 days A FACE TO FACE EVALUATION WAS PERFORMED  Charlett Blake 11/25/2019, 10:03 AM

## 2019-11-26 ENCOUNTER — Inpatient Hospital Stay (HOSPITAL_COMMUNITY): Payer: Medicare Other

## 2019-11-26 ENCOUNTER — Inpatient Hospital Stay (HOSPITAL_COMMUNITY): Payer: Medicare Other | Admitting: Physical Therapy

## 2019-11-26 LAB — RENAL FUNCTION PANEL
Albumin: 2.8 g/dL — ABNORMAL LOW (ref 3.5–5.0)
Albumin: 2.8 g/dL — ABNORMAL LOW (ref 3.5–5.0)
Anion gap: 16 — ABNORMAL HIGH (ref 5–15)
Anion gap: 10 (ref 5–15)
BUN: 83 mg/dL — ABNORMAL HIGH (ref 6–20)
BUN: 25 mg/dL — ABNORMAL HIGH (ref 6–20)
CO2: 25 mmol/L (ref 22–32)
CO2: 27 mmol/L (ref 22–32)
Calcium: 9 mg/dL (ref 8.9–10.3)
Calcium: 8.6 mg/dL — ABNORMAL LOW (ref 8.9–10.3)
Chloride: 92 mmol/L — ABNORMAL LOW (ref 98–111)
Chloride: 97 mmol/L — ABNORMAL LOW (ref 98–111)
Creatinine, Ser: 12.7 mg/dL — ABNORMAL HIGH (ref 0.61–1.24)
Creatinine, Ser: 5.84 mg/dL — ABNORMAL HIGH (ref 0.61–1.24)
GFR, Estimated: 4 mL/min — ABNORMAL LOW (ref >60)
GFR, Estimated: 10 mL/min — ABNORMAL LOW (ref >60)
Glucose, Bld: 83 mg/dL (ref 70–99)
Glucose, Bld: 88 mg/dL (ref 70–99)
Phosphorus: 6.1 mg/dL — ABNORMAL HIGH (ref 2.5–4.6)
Phosphorus: 2.8 mg/dL (ref 2.5–4.6)
Potassium: 5 mmol/L (ref 3.5–5.1)
Potassium: 3.9 mmol/L (ref 3.5–5.1)
Sodium: 133 mmol/L — ABNORMAL LOW (ref 135–145)
Sodium: 134 mmol/L — ABNORMAL LOW (ref 135–145)

## 2019-11-26 LAB — CBC WITH DIFFERENTIAL/PLATELET
Abs Immature Granulocytes: 0.05 10*3/uL (ref 0.00–0.07)
Basophils Absolute: 0.1 10*3/uL (ref 0.0–0.1)
Basophils Relative: 1 %
Eosinophils Absolute: 0.5 10*3/uL (ref 0.0–0.5)
Eosinophils Relative: 7 %
HCT: 31.1 % — ABNORMAL LOW (ref 39.0–52.0)
Hemoglobin: 9.7 g/dL — ABNORMAL LOW (ref 13.0–17.0)
Immature Granulocytes: 1 %
Lymphocytes Relative: 16 %
Lymphs Abs: 1.2 10*3/uL (ref 0.7–4.0)
MCH: 28.4 pg (ref 26.0–34.0)
MCHC: 31.2 g/dL (ref 30.0–36.0)
MCV: 91.2 fL (ref 80.0–100.0)
Monocytes Absolute: 0.8 10*3/uL (ref 0.1–1.0)
Monocytes Relative: 10 %
Neutro Abs: 5.1 10*3/uL (ref 1.7–7.7)
Neutrophils Relative %: 65 %
Platelets: 134 10*3/uL — ABNORMAL LOW (ref 150–400)
RBC: 3.41 MIL/uL — ABNORMAL LOW (ref 4.22–5.81)
RDW: 15.3 % (ref 11.5–15.5)
WBC: 7.7 10*3/uL (ref 4.0–10.5)
nRBC: 0 % (ref 0.0–0.2)

## 2019-11-26 LAB — GLUCOSE, CAPILLARY
Glucose-Capillary: 103 mg/dL — ABNORMAL HIGH (ref 70–99)
Glucose-Capillary: 57 mg/dL — ABNORMAL LOW (ref 70–99)
Glucose-Capillary: 67 mg/dL — ABNORMAL LOW (ref 70–99)
Glucose-Capillary: 62 mg/dL — ABNORMAL LOW (ref 70–99)
Glucose-Capillary: 68 mg/dL — ABNORMAL LOW (ref 70–99)
Glucose-Capillary: 87 mg/dL (ref 70–99)
Glucose-Capillary: 93 mg/dL (ref 70–99)

## 2019-11-26 MED ORDER — SODIUM CHLORIDE 0.9 % IV SOLN: 100.0000 mL | INTRAVENOUS | Status: DC | PRN

## 2019-11-26 MED ORDER — INSULIN GLARGINE 100 UNIT/ML ~~LOC~~ SOLN
12.0000 [IU] | Freq: Every day | SUBCUTANEOUS | Status: DC
  Administered 2019-11-26 – 2019-11-28 (×3): 12 [IU] via SUBCUTANEOUS
  Filled 2019-11-26 (×4): qty 0.12

## 2019-11-26 MED ORDER — DOXERCALCIFEROL 4 MCG/2ML IV SOLN
INTRAVENOUS | Status: AC
  Filled 2019-11-26: qty 2

## 2019-11-26 MED ORDER — GLUCOSE 40 % PO GEL: 1.0000 | ORAL | Status: AC

## 2019-11-26 MED ORDER — HEPARIN SODIUM (PORCINE) 1000 UNIT/ML IJ SOLN
INTRAMUSCULAR | Status: AC
  Filled 2019-11-26: qty 4

## 2019-11-26 MED ORDER — HEPARIN SODIUM (PORCINE) 1000 UNIT/ML DIALYSIS: 4000.0000 [IU] | Freq: Once | INTRAMUSCULAR | Status: DC

## 2019-11-26 MED ORDER — HEPARIN SODIUM (PORCINE) 1000 UNIT/ML DIALYSIS: 2000.0000 [IU] | INTRAMUSCULAR | Status: DC | PRN

## 2019-11-26 MED ORDER — LIDOCAINE-PRILOCAINE 2.5-2.5 % EX CREA: 1.0000 "application " | TOPICAL_CREAM | CUTANEOUS | Status: DC | PRN

## 2019-11-26 MED ORDER — HEPARIN SODIUM (PORCINE) 1000 UNIT/ML DIALYSIS: 1000.0000 [IU] | INTRAMUSCULAR | Status: DC | PRN

## 2019-11-26 MED ORDER — ALTEPLASE 2 MG IJ SOLR: 2.0000 mg | Freq: Once | INTRAMUSCULAR | Status: DC | PRN

## 2019-11-26 MED ORDER — LIDOCAINE HCL (PF) 1 % IJ SOLN: 5.0000 mL | INTRAMUSCULAR | Status: DC | PRN

## 2019-11-26 MED ORDER — HEPARIN SODIUM (PORCINE) 1000 UNIT/ML IJ SOLN
INTRAMUSCULAR | Status: AC
  Administered 2019-11-26: 1000 [IU]
  Filled 2019-11-26: qty 4

## 2019-11-26 MED ORDER — PENTAFLUOROPROP-TETRAFLUOROETH EX AERO: 1.0000 "application " | INHALATION_SPRAY | CUTANEOUS | Status: DC | PRN

## 2019-11-26 NOTE — Progress Notes (Signed)
Physical Therapy Session Note  Patient Details  Name: Anthony Bullock MRN: [id number] Date of Birth: [birth year]  Today's Date: 11/26/2019 PT Individual Time: 0900-0955 PT Individual Time Calculation (min): 55 min   Short Term Goals: Week 2:  PT Short Term Goal 1 (Week 2): =LTG due to ELOS  Skilled Therapeutic Interventions/Progress Updates:    Pt received seated in w/c in room, agreeable to PT session. No complaints of pain. Pt is at mod I level for w/c mobility and independent for management of w/c parts. Squat pivot transfer throughout session with Supervision. Supine to/from sit with Supervision. Supine BLE strengthening exercises: SLR, hip abd, SAQ x 15 reps each. Sidelying RLE hip abd against gravity x 15 reps. Prone HS curls, hip ext, press-ups x 15 reps each. Pt returned to sitting EOM with Supervision. Seated BLE strengthening therex: marches, LAQ, hip add squeeze x 15 reps each. Squat pivot transfer back to w/c with Supervision. Manual w/c propulsion 4 x 150 ft with use of BUE at mod I level for global endurance training, seated rest breaks between bouts of propulsion. Discussed limb shrinker wear schedule and how to care for shrinkers. Discussed fall recovery and that pt would likely not be able to safely transfer floor to chair due to BLE BKA. Pt and family understanding that they should call EMS if patient experiences a fall to prevent further injury. Pt left seated in w/c in room with needs in reach at end of session.  Therapy Documentation Precautions:  Precautions Precautions: Fall, Other (comment) Precaution Comments: L BKA wound vac and limb guard; h/o R BKA (prosthetic in room) Restrictions Weight Bearing Restrictions: Yes LLE Weight Bearing: Non weight bearing Other Position/Activity Restrictions: NWB L LE   Therapy/Group: Individual Therapy   Excell Seltzer, PT, DPT  11/26/2019, 10:00 AM

## 2019-11-26 NOTE — Progress Notes (Signed)
Physical Therapy Session Note  Patient Details  Name: Anthony Bullock MRN: [id number] Date of Birth: [birth year]  Today's Date: 11/26/2019 PT Individual Time: 1045-1130 PT Individual Time Calculation (min): 45 min   Short Term Goals: Week 1:  PT Short Term Goal 1 (Week 1): Patient will perform basic transfers with CGA. PT Short Term Goal 1 - Progress (Week 1): Met PT Short Term Goal 2 (Week 1): [name] will perform sit to/from stand with min A using LRAD. PT Short Term Goal 2 - Progress (Week 1): Met PT Short Term Goal 3 (Week 1): Patient will demonstrate dynamic sitting balance with supervision >5 min. PT Short Term Goal 3 - Progress (Week 1): Met Week 2:  PT Short Term Goal 1 (Week 2): =LTG due to ELOS  Skilled Therapeutic Interventions/Progress Updates:    pt received in [facility] agreeable to therapy but reported he felt really tired post two therapy session this AM and reported he would try but may need to return to room. Pt directed in WC to day room ,125' supervision with decreased velocity noted. Pt directed in seated Bilateral upper extremity and Bilateral lower extremity strengthening exercises of marching, LAQ, hip adduction and abduction 2x10 with 1# on RLE with first rep and required removal of weight for second rep on LLE body weight resistance for same exercises 2x10. For upper extremity exercises 2# arm bar used overhead press, chest press, bicep curls, tricep curls, front raises x10 each. Pt then reported he needed to return to room and rest, too fatigued to continue. Pt directed in [facility] mobility back to room supervision and left in [facility] at pt request, alarm belt set, All needs in reach and in good condition. Call light in hand.   Pt missed 30 mins of PT treatment.  Therapy Documentation Precautions:  Precautions Precautions: Fall, Other (comment) Precaution Comments: L BKA wound vac and limb guard; h/o R BKA (prosthetic in room) Restrictions Weight Bearing Restrictions: Yes LLE  Weight Bearing: Non weight bearing Other Position/Activity Restrictions: NWB L LE General: PT Amount of Missed Time (min): 30 Minutes PT Missed Treatment Reason: Patient fatigue Vital Signs:   Pain: Pain Assessment Pain Scale: 0-10 Pain Score: 0-No pain Patients Stated Pain Goal: 4 Mobility:   Locomotion :    Trunk/Postural Assessment :    Balance:   Exercises:   Other Treatments:      Therapy/Group: Individual Therapy  Junie Panning 11/26/2019, 11:55 AM

## 2019-11-26 NOTE — Progress Notes (Signed)
This nurse administered pts 2200, 24 units of insulin. Pt became very upset and stated " you all do not know what the hell you are doing. You are trying to kill me." This nurse educated the pt on the medication ordered and how he had been getting the amount since admission. Pt was given peanut butter/crackers, chicken salad w/ slice bread, and sprite. Pt refused and only took the sprite. Pt stated he had his own snacks that he would eat.

## 2019-11-26 NOTE — Progress Notes (Addendum)
Pt CBG 67, sitting up in chair and alert but does state he feels like his blood sugar is low.  On fluid restriction but states he isn't near 1275ml and requests OJ instead of glucose gel. 4ozs OJ given to pt, will recheck CBG per protocol.

## 2019-11-26 NOTE — Patient Care Conference (Addendum)
Inpatient RehabilitationTeam Conference and Plan of Care Update Date: 11/26/2019   Time: 11:28 AM    Patient Name: Anthony Bullock      Medical Record Number: [id number]  Date of Birth: September 21, 1960 Sex: Male         Room/Bed: 4W04C/4W04C-01 Payor Info: Payor: MEDICARE / Plan: MEDICARE PART A AND B / Product Type: *No Product type* /    Admit Date/Time:  11/16/2019  1:51 PM  Primary Diagnosis:  Below-knee amputation of left lower extremity [facility])  Hospital Problems: Principal Problem:   Below-knee amputation of left lower extremity (HCC) Active Problems:   Pressure injury of back, stage 2 (HCC)   Malnutrition of moderate degree    Expected Discharge Date: Expected Discharge Date: 11/29/19  Team Members Present: Physician leading conference: Dr. Genice Rouge Care Coodinator Present: Cecile Sheerer, LCSWA;Tharon Kitch Marlyne Beards, RN, BSN, CRRN Nurse Present: Other (comment) PT Present: Peter Congo, PT OT Present: Ardis Rowan, COTA;Jennifer Katrinka Blazing, OT PPS Coordinator present : Edson Snowball, Park Breed, SLP Nurse Present: Joycelyn Das, RN    Current Status/Progress Goal Weekly Team Focus  Bowel/Bladder   Pt is continent of B/B, LBM 11/11 ( per chart, pt statesd "a few days ago"), pt is also oliguric  To remain continent of bowel/bladder.  Assess toileting needs   Swallow/Nutrition/ Hydration             ADL's   bathing/LB dressing-supervisin; BSC tranfsers-CGA; toileting-CGA; endurance improving  supervision overall  education, endurance, functional transfers   Mobility   Supervision bed mobility, transfers Supervision squat pivot, min A stand pivot transfer with RW, Supervision w/c mobility  CGA to mod I, w/c level  family education and d/c planning   Communication             Safety/Cognition/ Behavioral Observations            Pain   Pt c/o phantam pain to the LLE- BKA. PRN tylenol effective.  To decrease pain level to make pt more comfortable.  Assess pain Qshift/PRN    Skin   Surgical incison to LLE w/staples. Dry dressing in place. @ pressure owunds to buttocks, present prior to admission.  To promote healing and preventing further skin breakdown.  Assess skin qshift/prn     Discharge Planning:  (P) D/c to home with 24/7care; wife works from home and is able to assist. Fam edu on 11/15 and 11/17 1pm-3pm with his wife; second day if needed. Pt will have [facility] for HHPT/OT/SN. DME ordered.   Team Discussion: Patient is choosey about which medications he is going to take. Continent B/B, Pressure injuries are healing well. PT reports patient is a squat pivot transfer and is on target to meet goals for discharge. OT reports patient is contact guard and fatigues still but is on target to meet discharge goals. MD added antibiotics for BKA incision d/t pus drainage. Patient requested his Lantus to be lowered. Patient on target to meet rehab goals: yes  *See Care Plan and progress notes for long and short-term goals.   Revisions to Treatment Plan:  MD added antibiotic, and decreased Lantus per patient request.  Teaching Needs: Continue family education, diabetes teaching, wound care teaching  Current Barriers to Discharge: Inaccessible home environment, Decreased caregiver support, Home enviroment access/layout, Wound care, Weight bearing restrictions, Medication compliance and Behavior  Possible Resolutions to Barriers: Teach wound care and dressing changes, diabetes medication teachings, continue current regimen, education on safety awareness, education on weight bearing precautions.  Medical Summary Current Status: K+ 5.0- needs rechecking; some days doesn't take meds ordered; continent B/B- pressure ulcers- pretty much healed per nurse  Barriers to Discharge: Behavior;Decreased family/caregiver support;Hemodialysis;Home enviroment access/layout;Medical stability;Medication compliance;Weight bearing restrictions;Wound care;Other (comments)   Barriers to Discharge Comments: R BKA/new L BKA using R BKA prosthesis Possible Resolutions to [employer] Focus: labs today to f/u on WBC and K+   d/c 11/19- family training this week; less fatigue with therapy   Continued Need for Acute Rehabilitation Level of Care: The patient requires daily medical management by a physician with specialized training in physical medicine and rehabilitation for the following reasons: Direction of a multidisciplinary physical rehabilitation program to maximize functional independence : Yes Medical management of patient stability for increased activity during participation in an intensive rehabilitation regime.: Yes Analysis of laboratory values and/or radiology reports with any subsequent need for medication adjustment and/or medical intervention. : Yes   I attest that I was present, lead the team conference, and concur with the assessment and plan of the team.   Tennis Must 11/26/2019, 1:01 PM

## 2019-11-26 NOTE — Progress Notes (Signed)
Pt refused 2200 Heparin, pt states " they just gave me that a few hours ago, I do not need anymore." pt educated that Dialysis gives an additional dose. Pt continued to refuse. Pt also declined the HS snack given, pt stated " no one can eat that S**t they send". Pt sitting up in wheelchair at this time. Call light in reach.

## 2019-11-26 NOTE — Progress Notes (Signed)
Occupational Therapy Session Note  Patient Details  Name: Anthony Bullock MRN: [id number] Date of Birth: Sep 23, 1960  Today's Date: 11/26/2019 OT Individual Time: 0700-0755 OT Individual Time Calculation (min): 55 min    Short Term Goals: Week 2:  OT Short Term Goal 1 (Week 2): STG=LTG secondary to ELOS  Skilled Therapeutic Interventions/Progress Updates:    Pt resting in w/c upon arrival.  Pt bathed and donned clothing prior to therapy. Pt propelled w/c to elevators with supervision. Pt transitioned to [employer] and initally engaged in [employer] activity with focus on standing balance using RUE to tap buttons. [employer] X 2 with CGA when standing. BUE therex on arm bike (7 mins X 2 random level 4 in opposite directions). Pt able to complete each segment without rest. Pt transitioned to gym and stood at hi-lo table for pipe tree activity. Pt unable to use BUE simultaneously during task. Pt became fatigued when taking structure apart and had to sit back into chair. Pt stated he was "worn out." Pt returned to room and remained in w/c with all needs within reach.   Therapy Documentation Precautions:  Precautions Precautions: Fall, Other (comment) Precaution Comments: L BKA wound vac and limb guard; h/o R BKA (prosthetic in room) Restrictions Weight Bearing Restrictions: Yes LLE Weight Bearing: Non weight bearing Other Position/Activity Restrictions: NWB L LE  Pain:  Pt denies pain this morning   Therapy/Group: Individual Therapy  Leroy Libman 11/26/2019, 7:58 AM

## 2019-11-26 NOTE — Progress Notes (Addendum)
Pt CBG rechecked in bed on the way to dialysis.  CBG 68 after drinking 4ozs of OJ and eating full lunch tray. Pt not symptomatic and declines further intervention, advising transporter to continue on.  Dialysis RN Phill Myron called and made aware.

## 2019-11-26 NOTE — Progress Notes (Signed)
[facility] PHYSICAL MEDICINE & REHABILITATION PROGRESS NOTE   Subjective/Complaints:  Pt reports incision was looking good- LBM Sunday- was big one.  Denies constipation.   Wants to decrease Lantus or make a SSI of Lantus, based on what he eats- I explained cannot make lantus Sliding scale, but can reduce it. He was very insistent that needs to decrease to 12 units max.   Will do so, since he says he's having to "eat to keep BGs up".   He's supposed ot leave Friday.    ROS:  Pt denies SOB, abd pain, CP, N/V/C/D, and vision changes   Objective:   No results found. Recent Labs    11/23/19 1352  WBC 5.9  HGB 10.1*  HCT 32.9*  PLT 165   Recent Labs    11/23/19 1352  NA 134*  K 5.0  CL 94*  CO2 27  GLUCOSE 176*  BUN 61*  CREATININE 10.66*  CALCIUM 8.9    Intake/Output Summary (Last 24 hours) at 11/26/2019 0958 Last data filed at 11/26/2019 0846 Gross per 24 hour  Intake 820 ml  Output --  Net 820 ml     Pressure Injury 09/03/19 Sacrum Medial Stage 2 -  Partial thickness loss of dermis presenting as a shallow open injury with a red, pink wound bed without slough. (Active)  09/03/19 1400  Location: Sacrum  Location Orientation: Medial  Staging: Stage 2 -  Partial thickness loss of dermis presenting as a shallow open injury with a red, pink wound bed without slough.  Wound Description (Comments):   Present on Admission: Yes     Pressure Injury 11/16/19 Buttocks Left Stage 2 -  Partial thickness loss of dermis presenting as a shallow open injury with a red, pink wound bed without slough. dry shiny nonblanchable area what appears to be a healing stage 2 (Active)  11/16/19 1435  Location: Buttocks  Location Orientation: Left  Staging: Stage 2 -  Partial thickness loss of dermis presenting as a shallow open injury with a red, pink wound bed without slough.  Wound Description (Comments): dry shiny nonblanchable area what appears to be a healing stage 2  Present  on Admission: Yes     Pressure Injury 11/16/19 Buttocks Right Stage 2 -  Partial thickness loss of dermis presenting as a shallow open injury with a red, pink wound bed without slough. nonblanchable area what appears to be a healing stage 2 with dry flacky skin covering (Active)  11/16/19 1435  Location: Buttocks  Location Orientation: Right  Staging: Stage 2 -  Partial thickness loss of dermis presenting as a shallow open injury with a red, pink wound bed without slough.  Wound Description (Comments): nonblanchable area what appears to be a healing stage 2 with dry flacky skin covering  Present on Admission: Yes    Physical Exam: Vital Signs Blood pressure 140/60, pulse 73, temperature 98.6 F (37 C), temperature source Oral, resp. rate 18, height 4' 9.09" (1.45 m), weight 84.2 kg, SpO2 96 %.  General: No acute distress- sitting up in bedside chair, NAD Mood and affect are- irritable over insulin Heart: RRR Lungs: CTA B/L- no W/R/R- good air movement Abdomen: Soft, NT, ND, (+)BS  Extremities: No clubbing, cyanosis, or edema Skin: No evidence of breakdown, no evidence of rash  Left BKA POD #12 Skin: incision was draining slight appearance of pus today, however no more once wiped away- on lateral aspect of incision- MUCH less redness- redness >75% resolved and L knee  healed over now.  Neurologic: Cranial nerves II through XII intact, motor strength is 5/5 in bilateral deltoid, bicep, tricep, grip, hip flexor (s/p B BKA)   ,Musculoskeletal: Full range of motion in all 4 extremities. No joint swelling  Bilaterl BKA         Assessment/Plan: 1. Functional deficits secondary to left BKA which require 3+ hours per day of interdisciplinary therapy in a comprehensive inpatient rehab setting.  Physiatrist is providing close team supervision and 24 hour management of active medical problems listed below.  Physiatrist and rehab team continue to assess barriers to discharge/monitor patient  progress toward functional and medical goals  Care Tool:  Bathing    Body parts bathed by patient: Right arm, Left arm, Chest, Abdomen, Front perineal area, Right upper leg, Left upper leg, Face   Body parts bathed by helper: Buttocks Body parts n/a: Right lower leg, Left lower leg   Bathing assist Assist Level: Minimal Assistance - Patient > 75%     Upper Body Dressing/Undressing Upper body dressing   What is the patient wearing?: Pull over shirt    Upper body assist Assist Level: Set up assist    Lower Body Dressing/Undressing Lower body dressing      What is the patient wearing?: Underwear/pull up     Lower body assist Assist for lower body dressing: Minimal Assistance - Patient > 75%     Toileting Toileting    Toileting assist Assist for toileting: Moderate Assistance - Patient 50 - 74%     Transfers Chair/bed transfer  Transfers assist  Chair/bed transfer activity did not occur: Safety/medical concerns  Chair/bed transfer assist level: Supervision/Verbal cueing     Locomotion Ambulation   Ambulation assist   Ambulation activity did not occur: Safety/medical concerns (new L BKA with R prosthesis, decreased strength/balance)          Walk 10 feet activity   Assist  Walk 10 feet activity did not occur: Safety/medical concerns        Walk 50 feet activity   Assist Walk 50 feet with 2 turns activity did not occur: Safety/medical concerns         Walk 150 feet activity   Assist Walk 150 feet activity did not occur: Safety/medical concerns         Walk 10 feet on uneven surface  activity   Assist Walk 10 feet on uneven surfaces activity did not occur: Safety/medical concerns         Wheelchair     Assist Will patient use wheelchair at discharge?: Yes Type of Wheelchair: Manual    Wheelchair assist level: Supervision/Verbal cueing Max wheelchair distance: 150'    Wheelchair 50 feet with 2 turns  activity    Assist        Assist Level: Supervision/Verbal cueing   Wheelchair 150 feet activity     Assist      Assist Level: Supervision/Verbal cueing   Blood pressure 140/60, pulse 73, temperature 98.6 F (37 C), temperature source Oral, resp. rate 18, height 4' 9.09" (1.45 m), weight 84.2 kg, SpO2 96 %.   Medical Problem List and Plan: 1.  Impaired mobility and ADLs secondary to left BKA             -patient may not shower  11/13- got Limb guard fixed- happy about that             -ELOS/Goals: tent dc 11/19  Team conf in am  2.  Antithrombotics: -DVT/anticoagulation:  Pharmaceutical: Heparin             -antiplatelet therapy: ASA 81 mg daily 3. Pain Management:  Oxycodone prn managing pain well.              --changed Gabapentin to 100mg  daily and 200mg  HS to better help with sleep at night, patient agreeable  11/16- pain controlled- con't regimen 4. Mood: LCSW to follow for evaluation and support.              -antipsychotic agents: N/A 5. Neuropsych: This patient is capable of making decisions on his own behalf. 6. Skin/Wound Care: Routine pressure relief measures.              --Wound VAC to stay in place till 11/10  11/9--Stage 2 pressure injury to bilateral buttocks and sacrum- monitor daily and offload q2H. con't wound care   11/9- VAC off Wednesday  11/11- said pain better controlled without VAC on-   11/12- will start Keflex base don HD_ 500 mg BID due to deep red spot- could be bruise, could be spreading infection of skin-   11/13- looks smaller- con't keflex for now since getting better  11/14- looking less red/smaller area affected daily- con't keflex  11/16- redness >75% improved- a little drainage- ?pus? From incision, but WBC looks great, and redness much improved- con't Keflex.  7. Fluids/Electrolytes/Nutrition: Strict I/O.             -- Renal/CM diet with 1200 cc/FR 8. HTN : Monitor BP tid--continue Amlodipine, coreg and Furosemide 9. ESRD: HD  - TTS with nephrology following for assist.              --Schedule HD at the end of the day to help with tolerance of therapy.   -Appreciate nephrology following.  10. T2DM with neuropathy/nephropathy: Hgb A1c-6.5 and well controlled.  Reports was on Basaglar 12 units at bedtime with meal coverage/SSI.                            --Lantus decreased to 25 units on 11/05. May need to decrease further as activity increases. CBG (last 3)  Recent Labs    11/25/19 1649 11/25/19 2110 11/26/19 0618  GLUCAP 109* 148* 103*   11/16- pt insists to decrease Lantus to 12 units and will use SSI to get control- so can "eat less".   11.NICM s/p AICD: Monitor for symptoms with increase in mobility.  Continue Lipitor, coreg, ASA, fenofibrate and Lasix  12. Nausea/constipation: Has been intermittent. Question due to constipation--will change colace to Senna S and add pepcid for dyspepsia.   11/9- vomting this AM- no constipation that we can tell- con't zofran prn  11/11- LBM 4 days ago and no BM for day prior to that- ordered miralax, increased senokot- and ordered sorbitol prn  11/12- pt said better- will monitor  11/14- resolved 13. Overweight (BMI 25.46): Dietary consult ordered for education.  14. HTN with hypotension  11/8- will reduce Norvasc to 5 mg daily. - will start in AM  11/9- BP slightly elevated this AM 152/66- will monitor- might need to increase Norvasc to 7.5 mg daily.  11/11- BP 122/48- this AM- held BP meds today due to hypotension in HD.  15/ Hyperkalemia  11/11- K+ 6.3- per nephrology- baths in HD  11/13- K+ 5.0-   11/16- will recheck labs today with HD  LOS: 10 days A FACE TO FACE EVALUATION WAS PERFORMED  Bethania Schlotzhauer 11/26/2019, 9:58 AM

## 2019-11-27 ENCOUNTER — Inpatient Hospital Stay (HOSPITAL_COMMUNITY): Payer: Medicare Other | Admitting: Physical Therapy

## 2019-11-27 ENCOUNTER — Inpatient Hospital Stay (HOSPITAL_COMMUNITY): Payer: Medicare Other

## 2019-11-27 LAB — GLUCOSE, CAPILLARY
Glucose-Capillary: 71 mg/dL (ref 70–99)
Glucose-Capillary: 58 mg/dL — ABNORMAL LOW (ref 70–99)
Glucose-Capillary: 104 mg/dL — ABNORMAL HIGH (ref 70–99)
Glucose-Capillary: 147 mg/dL — ABNORMAL HIGH (ref 70–99)
Glucose-Capillary: 134 mg/dL — ABNORMAL HIGH (ref 70–99)
Glucose-Capillary: 178 mg/dL — ABNORMAL HIGH (ref 70–99)

## 2019-11-27 NOTE — Progress Notes (Signed)
Occupational Therapy Session Note  Patient Details  Name: Anthony Bullock MRN: [id number] Date of Birth: [birth year]  Today's Date: 11/27/2019 OT Individual Time: 0700-0810 OT Individual Time Calculation (min): 70 min    Short Term Goals: Week 2:  OT Short Term Goal 1 (Week 2): STG=LTG secondary to ELOS  Skilled Therapeutic Interventions/Progress Updates:    Pt resting in w/c upon arrival and ready for therapy. Pt stated he was still "recovering" from low BG during the night and was feeling weak.  OT intervention with focus on BUE therex on [employer] and 3# bar. 7 mins manual load 3 X 2 on [employer]. Pt performed rowing with 3# bar-20x3. Pt also engaged in rebounder activity with 1kg ball-20x3. Pt practiced w/c mobility in home enviroinment with supervision. Pt returned to room and remained in w/c with all needs within reach.   Therapy Documentation Precautions:  Precautions Precautions: Fall, Other (comment) Precaution Comments: L BKA wound vac and limb guard; h/o R BKA (prosthetic in room) Restrictions Weight Bearing Restrictions: Yes LLE Weight Bearing: Non weight bearing Other Position/Activity Restrictions: NWB L LE Pain:  No c/o pain but pt states he feels weak (low BG earlier)   Therapy/Group: Individual Therapy  Leroy Libman 11/27/2019, 8:12 AM

## 2019-11-27 NOTE — Progress Notes (Signed)
Night snack given to the pt. Pt states :"I am sure I do not even like what they sent." Pt was also provided peanut butter and graham crackers.

## 2019-11-27 NOTE — Progress Notes (Signed)
Physical Therapy Session Note  Patient Details  Name: Anthony Bullock MRN: [id number] Date of Birth: 01-20-1960  Today's Date: 11/27/2019 PT Individual Time: 1000-1110; 9741-6384 PT Individual Time Calculation (min): 70 min and 45 min  PT Amount of Missed Time (min): 15 Minutes PT Missed Treatment Reason: Patient fatigue  Short Term Goals: Week 2:  PT Short Term Goal 1 (Week 2): =LTG due to ELOS  Skilled Therapeutic Interventions/Progress Updates:    Session 1: Pt received seated in w/c in room, agreeable to PT session. Pt reports 5/10 pain in L residual limb, declines any intervention at this time. Manual w/c propulsion x 150 ft with use of BUE at mod I level. Pt is independent for management of w/c parts and setting up for safe squat pivot transfers to mat table. Seated BUE strengthening therex with 4# dowel rod: bicep curls, chest press, OH press, L/R canoeing 2 x 15 reps each. Squat pivot transfer w/c to/from mat table with Supervision. Sit to/from supine at Supervision level on flat mat table. Supine BLE strengthening therex: SLR, hip abd, SAQ, modified bridges; sidelying L hip abd; prone BLE hip ext, HS curls, press-ups x 15 reps each. Pt has HEP handout of all exercises performed. Pt requires rest breaks between exercises due to fatigue this date. Pt returned to w/c in similar manner as stated above. Pt returned to room via w/c in similar manner as stated above. Pt left seated in w/c in room with needs in reach at end of session.  Session 2: Pt received seated in w/c in room, agreeable to PT session. Pt reports increase in L residual limb pain this afternoon but continues to decline any intervention, reports he will request pain medication from nursing at end of session. Took pt outside for improved patient mood. Pt able to propel manual w/c at mod I level up to 150 ft with use of BUE before onset of fatigue. Pt able to propel across uneven ground and up/down incline with increased time and  rest breaks needed to complete. Pt fatigues very quickly with w/c mobility outdoors. Seated endurance task performing 4# dowel rod volleyball, 2 x 20 reps to fatigue. Sit to stand 2 x 5 reps from w/c to RW with CGA this date. Pt exhibits improved ability to perform sit to stand independently this date. Pt requests to return to room due to fatigue. Pt left seated in w/c in room with needs in reach, wife present at end of session. Pt missed 15 min of scheduled therapy session due to fatigue.  Therapy Documentation Precautions:  Precautions Precautions: Fall, Other (comment) Precaution Comments: L BKA wound vac and limb guard; h/o R BKA (prosthetic in room) Restrictions Weight Bearing Restrictions: Yes LLE Weight Bearing: Non weight bearing Other Position/Activity Restrictions: NWB L LE General: PT Amount of Missed Time (min): 15 Minutes PT Missed Treatment Reason: Patient fatigue    Therapy/Group: Individual Therapy   Excell Seltzer, PT, DPT  11/27/2019, 5:00 PM

## 2019-11-27 NOTE — Progress Notes (Signed)
Patient ID: Anthony Bullock, male   DOB: 01-27-1960, 59 y.o.   MRN: [id number]  SW met with pt in room to provide updates from team conference, and pt remains on target for d/c.   Loralee Pacas, MSW, [place] Office: [phone] Cell: [phone] Fax: [phone]

## 2019-11-27 NOTE — Progress Notes (Signed)
[facility] PHYSICAL MEDICINE & REHABILITATION PROGRESS NOTE   Subjective/Complaints:   Pt in hallway going back to room from gym- had a low BG- had no snack last night. Lowered Lantus to 12 units last night.     ROS:  Pt denies SOB, abd pain, CP, N/V/C/D, and vision changes    Objective:   No results found. Recent Labs    11/26/19 1400  WBC 7.7  HGB 9.7*  HCT 31.1*  PLT 134*   Recent Labs    11/26/19 1400 11/26/19 1951  NA 133* 134*  K 5.0 3.9  CL 92* 97*  CO2 25 27  GLUCOSE 83 88  BUN 83* 25*  CREATININE 12.70* 5.84*  CALCIUM 9.0 8.6*    Intake/Output Summary (Last 24 hours) at 11/27/2019 1136 Last data filed at 11/27/2019 0820 Gross per 24 hour  Intake 340 ml  Output 1700 ml  Net -1360 ml     Pressure Injury 11/16/19 Buttocks Left Stage 2 -  Partial thickness loss of dermis presenting as a shallow open injury with a red, pink wound bed without slough. dry shiny nonblanchable area what appears to be a healing stage 2 (Active)  11/16/19 1435  Location: Buttocks  Location Orientation: Left  Staging: Stage 2 -  Partial thickness loss of dermis presenting as a shallow open injury with a red, pink wound bed without slough.  Wound Description (Comments): dry shiny nonblanchable area what appears to be a healing stage 2  Present on Admission: Yes     Pressure Injury 11/16/19 Buttocks Right Stage 2 -  Partial thickness loss of dermis presenting as a shallow open injury with a red, pink wound bed without slough. nonblanchable area what appears to be a healing stage 2 with dry flacky skin covering (Active)  11/16/19 1435  Location: Buttocks  Location Orientation: Right  Staging: Stage 2 -  Partial thickness loss of dermis presenting as a shallow open injury with a red, pink wound bed without slough.  Wound Description (Comments): nonblanchable area what appears to be a healing stage 2 with dry flacky skin covering  Present on Admission: Yes    Physical  Exam: Vital Signs Blood pressure (!) 155/66, pulse 79, temperature 98.2 F (36.8 C), resp. rate 17, height 4' 9.09" (1.45 m), weight 83 kg, SpO2 100 %.  General: sitting up in w/c- appropriate, NAD Mood and affect are- irritable over insulin Heart: RRR Lungs: CTA B/L- no W/R/R- good air movement Abdomen: Soft, NT, ND, (+)BS  Extremities: No clubbing, cyanosis, or edema Skin: No evidence of breakdown, no evidence of rash  Left BKA POD #14 Skin: incision was draining slight appearance of pus today, however no more once wiped away- on lateral aspect of incision- MUCH less redness- redness >75% resolved and L knee healed over now.  Neurologic: Cranial nerves II through XII intact, motor strength is 5/5 in bilateral deltoid, bicep, tricep, grip, hip flexor (s/p B BKA)   ,Musculoskeletal: Full range of motion in all 4 extremities. No joint swelling  Bilaterl BKA         Assessment/Plan: 1. Functional deficits secondary to left BKA which require 3+ hours per day of interdisciplinary therapy in a comprehensive inpatient rehab setting.  Physiatrist is providing close team supervision and 24 hour management of active medical problems listed below.  Physiatrist and rehab team continue to assess barriers to discharge/monitor patient progress toward functional and medical goals  Care Tool:  Bathing    Body parts bathed by  patient: Right arm, Left arm, Chest, Abdomen, Front perineal area, Right upper leg, Left upper leg, Face   Body parts bathed by helper: Buttocks Body parts n/a: Right lower leg, Left lower leg   Bathing assist Assist Level: Minimal Assistance - Patient > 75%     Upper Body Dressing/Undressing Upper body dressing   What is the patient wearing?: Pull over shirt    Upper body assist Assist Level: Set up assist    Lower Body Dressing/Undressing Lower body dressing      What is the patient wearing?: Underwear/pull up     Lower body assist Assist for lower body  dressing: Minimal Assistance - Patient > 75%     Toileting Toileting    Toileting assist Assist for toileting: Moderate Assistance - Patient 50 - 74%     Transfers Chair/bed transfer  Transfers assist  Chair/bed transfer activity did not occur: Safety/medical concerns  Chair/bed transfer assist level: Supervision/Verbal cueing     Locomotion Ambulation   Ambulation assist   Ambulation activity did not occur: Safety/medical concerns (new L BKA with R prosthesis, decreased strength/balance)          Walk 10 feet activity   Assist  Walk 10 feet activity did not occur: Safety/medical concerns        Walk 50 feet activity   Assist Walk 50 feet with 2 turns activity did not occur: Safety/medical concerns         Walk 150 feet activity   Assist Walk 150 feet activity did not occur: Safety/medical concerns         Walk 10 feet on uneven surface  activity   Assist Walk 10 feet on uneven surfaces activity did not occur: Safety/medical concerns         Wheelchair     Assist Will patient use wheelchair at discharge?: Yes Type of Wheelchair: Manual    Wheelchair assist level: Independent Max wheelchair distance: 150'    Wheelchair 50 feet with 2 turns activity    Assist        Assist Level: Independent   Wheelchair 150 feet activity     Assist      Assist Level: Independent   Blood pressure (!) 155/66, pulse 79, temperature 98.2 F (36.8 C), resp. rate 17, height 4' 9.09" (1.45 m), weight 83 kg, SpO2 100 %.   Medical Problem List and Plan: 1.  Impaired mobility and ADLs secondary to left BKA             -patient may not shower  11/13- got Limb guard fixed- happy about that  11/17- changed Keflex to 10 days.              -ELOS/Goals: tent dc 11/19  Team conf in am  2.  Antithrombotics: -DVT/anticoagulation:  Pharmaceutical: Heparin             -antiplatelet therapy: ASA 81 mg daily 3. Pain Management:  Oxycodone prn  managing pain well.              --changed Gabapentin to 100mg  daily and 200mg  HS to better help with sleep at night, patient agreeable  11/16- pain controlled- con't regimen  11/17- pain controlled- con't meds 4. Mood: LCSW to follow for evaluation and support.              -antipsychotic agents: N/A 5. Neuropsych: This patient is capable of making decisions on his own behalf. 6. Skin/Wound Care: Routine pressure relief measures.              --  Wound VAC to stay in place till 11/10  11/9--Stage 2 pressure injury to bilateral buttocks and sacrum- monitor daily and offload q2H. con't wound care   11/9- VAC off Wednesday  11/11- said pain better controlled without VAC on-   11/12- will start Keflex base don HD_ 500 mg BID due to deep red spot- could be bruise, could be spreading infection of skin-   11/13- looks smaller- con't keflex for now since getting better  11/14- looking less red/smaller area affected daily- con't keflex  11/16- redness >75% improved- a little drainage- ?pus? From incision, but WBC looks great, and redness much improved- con't Keflex.   11/17- change keflex to max of 10 days. WBC 7.7 7. Fluids/Electrolytes/Nutrition: Strict I/O.             -- Renal/CM diet with 1200 cc/FR 8. HTN : Monitor BP tid--continue Amlodipine, coreg and Furosemide 9. ESRD: HD - TTS with nephrology following for assist.              --Schedule HD at the end of the day to help with tolerance of therapy.   -Appreciate nephrology following.  10. T2DM with neuropathy/nephropathy: Hgb A1c-6.5 and well controlled.  Reports was on Basaglar 12 units at bedtime with meal coverage/SSI.                            --Lantus decreased to 25 units on 11/05. May need to decrease further as activity increases. CBG (last 3)  Recent Labs    11/26/19 2051 11/27/19 0555 11/27/19 0700  GLUCAP 93 58* 104*   11/16- pt insists to decrease Lantus to 12 units and will use SSI to get control- so can "eat less".    11/17- no snack last night- will con't lantus for now- and see if he eats snack.   11.NICM s/p AICD: Monitor for symptoms with increase in mobility.  Continue Lipitor, coreg, ASA, fenofibrate and Lasix  12. Nausea/constipation: Has been intermittent. Question due to constipation--will change colace to Senna S and add pepcid for dyspepsia.   11/9- vomting this AM- no constipation that we can tell- con't zofran prn  11/11- LBM 4 days ago and no BM for day prior to that- ordered miralax, increased senokot- and ordered sorbitol prn  11/12- pt said better- will monitor  11/14- resolved 13. Overweight (BMI 25.46): Dietary consult ordered for education.  14. HTN with hypotension  11/8- will reduce Norvasc to 5 mg daily. - will start in AM  11/9- BP slightly elevated this AM 152/66- will monitor- might need to increase Norvasc to 7.5 mg daily.  11/11- BP 122/48- this AM- held BP meds today due to hypotension in HD.  15/ Hyperkalemia  11/11- K+ 6.3- per nephrology- baths in HD  11/13- K+ 5.0-   11/16- will recheck labs today with HD  11/17- K+ 3.9 LOS: 11 days A FACE TO FACE EVALUATION WAS PERFORMED  Tephanie Escorcia 11/27/2019, 11:36 AM

## 2019-11-27 NOTE — Significant Event (Signed)
Hypoglycemic Event  CBG: 58  Treatment: breakfast tray, 240 ml OJ, 1 pkt pf peanut butter.  Symptoms: Asymptomatic   Follow-up CBG: Time: 1025 CBG Result:104  Possible Reasons for Event: Pt states he did not eat a snack last night before going to sleep   Comments/MD notified:No     Wilma Wuthrich M Amaru Burroughs

## 2019-11-27 NOTE — Progress Notes (Addendum)
[facility] Progress Note   Subjective:  No c/o today, wt's around 83kg  Objective Vitals:   11/26/19 1745 11/26/19 1803 11/26/19 1934 11/27/19 0525  BP: (!) 121/56 (!) 133/59 (!) 134/55 (!) 155/66  Pulse:  71 66 79  Resp:  17    Temp:  97.9 F (36.6 C) 97.9 F (36.6 C) 98.2 F (36.8 C)  TempSrc:      SpO2:  100% 100% 100%  Weight:      Height:       Physical Exam General:WDWN male in NAD Heart:RRR Lungs:CTAB Abdomen:soft, NTND Extremities:b/l BKA, no stump edema Dialysis Access: TDC, LU AVF maturing   Filed Weights   11/25/19 0500 11/26/19 1315 11/26/19 1700  Weight: 84.2 kg 85 kg 83 kg    Intake/Output Summary (Last 24 hours) at 11/27/2019 1201 Last data filed at 11/27/2019 0820 Gross per 24 hour  Intake 340 ml  Output 1700 ml  Net -1360 ml    Additional Objective Labs: Basic Metabolic Panel: Recent Labs  Lab 11/23/19 1352 11/26/19 1400 11/26/19 1951  NA 134* 133* 134*  K 5.0 5.0 3.9  CL 94* 92* 97*  CO2 27 25 27   GLUCOSE 176* 83 88  BUN 61* 83* 25*  CREATININE 10.66* 12.70* 5.84*  CALCIUM 8.9 9.0 8.6*  PHOS 6.8* 6.1* 2.8   Liver Function Tests: Recent Labs  Lab 11/23/19 1352 11/26/19 1400 11/26/19 1951  ALBUMIN 2.6* 2.8* 2.8*   CBC: Recent Labs  Lab 11/21/19 1320 11/23/19 1352 11/26/19 1400  WBC 8.7 5.9 7.7  NEUTROABS  --   --  5.1  HGB 10.1* 10.1* 9.7*  HCT 33.0* 32.9* 31.1*  MCV 91.7 92.2 91.2  PLT 171 165 134*   CBG: Recent Labs  Lab 11/26/19 1317 11/26/19 1749 11/26/19 2051 11/27/19 0555 11/27/19 0700  GLUCAP 71 87 93 58* 104*    Medications:  . (feeding supplement) PROSource Plus  30 mL Oral BID BM  . amLODipine  5 mg Oral Daily  . aspirin EC  81 mg Oral Daily  . atorvastatin  80 mg Oral QHS  . carvedilol  25 mg Oral BID WC  . cephALEXin  500 mg Oral Q12H  . Chlorhexidine Gluconate Cloth  6 each Topical Q0600  . Chlorhexidine Gluconate Cloth  6 each Topical Q0600  . cinacalcet  30 mg Oral Q  T,Th,Sa-HD  . dextrose  1 Tube Oral STAT  . doxercalciferol  2.5 mcg Intravenous Q T,Th,Sa-HD  . famotidine  10 mg Oral QHS  . fenofibrate  160 mg Oral Daily  . furosemide  120 mg Oral Daily  . gabapentin  100 mg Oral Daily  . gabapentin  200 mg Oral QHS  . heparin  5,000 Units Subcutaneous Q8H  . insulin aspart  0-5 Units Subcutaneous QHS  . insulin aspart  0-9 Units Subcutaneous TID WC  . insulin aspart  2 Units Subcutaneous TID WC  . insulin glargine  12 Units Subcutaneous QHS  . lanthanum  2,000 mg Oral TID with meals  . lanthanum  500 mg Oral With snacks  . loratadine  10 mg Oral Daily  . multivitamin  1 tablet Oral QHS  . senna-docusate  2 tablet Oral QPC supper    [facility]600 2.2/5 bath TDC RIJ 83kg Hep 1000u +500units/hr - hect 2.5 ug tiw - epo 4000unitsIV tiw - sensipar 30mg po tiw  Assessment/Plan: 1.PAD- s/p L BKA on 11/3 by Dr. Sharol Given. Now in CIR.  2. ESRD- on HD TTS. Last K 5.0 - prior to HD.  TDC needed TPA here last week x 1. Have started back his regular HD heparin.  3. Anemiaof CKD-Hgb 10.1. No ESA indicated at this time. 4. Secondary hyperparathyroidism- Ca at goal. Phos elevated. Continue binders, sensipar and hectorol. 5.HTN/volume- BP well controlled. At dry wt but should be 2 kg under w/ BKA, will challenge dry wt w/ HD tomorrow.  6. Nutrition- Renal diet w/fluid restrictions. Alb low, continue protein supplements/renal vits.   Kelly Splinter, MD 11/27/2019, 12:01 PM

## 2019-11-28 ENCOUNTER — Inpatient Hospital Stay (HOSPITAL_COMMUNITY): Payer: Medicare Other

## 2019-11-28 ENCOUNTER — Inpatient Hospital Stay (HOSPITAL_COMMUNITY): Payer: Medicare Other | Admitting: Physical Therapy

## 2019-11-28 ENCOUNTER — Inpatient Hospital Stay (HOSPITAL_COMMUNITY): Payer: Medicare Other | Admitting: Occupational Therapy

## 2019-11-28 LAB — CBC
HCT: 32.1 % — ABNORMAL LOW (ref 39.0–52.0)
Hemoglobin: 10 g/dL — ABNORMAL LOW (ref 13.0–17.0)
MCH: 28.8 pg (ref 26.0–34.0)
MCHC: 31.2 g/dL (ref 30.0–36.0)
MCV: 92.5 fL (ref 80.0–100.0)
Platelets: 148 10*3/uL — ABNORMAL LOW (ref 150–400)
RBC: 3.47 MIL/uL — ABNORMAL LOW (ref 4.22–5.81)
RDW: 15.7 % — ABNORMAL HIGH (ref 11.5–15.5)
WBC: 8 10*3/uL (ref 4.0–10.5)
nRBC: 0 % (ref 0.0–0.2)

## 2019-11-28 LAB — RENAL FUNCTION PANEL
Albumin: 2.8 g/dL — ABNORMAL LOW (ref 3.5–5.0)
Anion gap: 12 (ref 5–15)
BUN: 63 mg/dL — ABNORMAL HIGH (ref 6–20)
CO2: 26 mmol/L (ref 22–32)
Calcium: 9.2 mg/dL (ref 8.9–10.3)
Chloride: 96 mmol/L — ABNORMAL LOW (ref 98–111)
Creatinine, Ser: 10.21 mg/dL — ABNORMAL HIGH (ref 0.61–1.24)
GFR, Estimated: 5 mL/min — ABNORMAL LOW (ref >60)
Glucose, Bld: 139 mg/dL — ABNORMAL HIGH (ref 70–99)
Phosphorus: 3.8 mg/dL (ref 2.5–4.6)
Potassium: 5.6 mmol/L — ABNORMAL HIGH (ref 3.5–5.1)
Sodium: 134 mmol/L — ABNORMAL LOW (ref 135–145)

## 2019-11-28 LAB — GLUCOSE, CAPILLARY
Glucose-Capillary: 87 mg/dL (ref 70–99)
Glucose-Capillary: 53 mg/dL — ABNORMAL LOW (ref 70–99)
Glucose-Capillary: 67 mg/dL — ABNORMAL LOW (ref 70–99)
Glucose-Capillary: 90 mg/dL (ref 70–99)
Glucose-Capillary: 116 mg/dL — ABNORMAL HIGH (ref 70–99)
Glucose-Capillary: 207 mg/dL — ABNORMAL HIGH (ref 70–99)

## 2019-11-28 MED ORDER — HEPARIN SODIUM (PORCINE) 1000 UNIT/ML DIALYSIS: 3000.0000 [IU] | Freq: Once | INTRAMUSCULAR | Status: DC

## 2019-11-28 MED ORDER — LANTHANUM CARBONATE 500 MG PO CHEW
500.0000 mg | CHEWABLE_TABLET | Freq: Three times a day (TID) | ORAL | Status: DC | PRN
  Filled 2019-11-28: qty 1

## 2019-11-28 MED ORDER — DOXERCALCIFEROL 4 MCG/2ML IV SOLN
INTRAVENOUS | Status: AC
  Administered 2019-11-28: 2.5 ug via INTRAVENOUS
  Filled 2019-11-28: qty 2

## 2019-11-28 NOTE — Progress Notes (Signed)
Physical Therapy Discharge Summary  Patient Details  Name: Anthony Bullock MRN: [id number] Date of Birth: [birth year]  Today's Date: 11/28/2019 PT Individual Time: 0915-1015 PT Individual Time Calculation (min): 60 min    Patient has met 8 of 9 long term goals due to improved activity tolerance, improved balance, improved postural control, increased strength, decreased pain, ability to compensate for deficits and functional use of  right lower extremity.  Patient to discharge at a wheelchair level Supervision.   Patient's care partner is independent to provide the necessary physical assistance at discharge.  Reasons goals not met: Car transfer goal not met as pt currently min A with transfers, goal set to [place]. Pt limited in ability to practice 2/2 car bench out of gym for repair, pt and wife educated on transfer with family education in person vehicle.. Both agreeable to assist at current level.   Recommendation:  Patient will benefit from ongoing skilled PT services in home health setting to continue to advance safe functional mobility, address ongoing impairments in transfers, standing balance, strengthening in Bilateral lower extremity, and minimize fall risk.  Equipment: drop arm BSC; hospital bed  Reasons for discharge: discharge from hospital  Patient/family agrees with progress made and goals achieved: Yes  PT Discharge Precautions/Restrictions Precautions Precautions: Fall;Other (comment) Precaution Comments: L BKA limb guard; h/o R BKA (prosthetic in room) Restrictions Weight Bearing Restrictions: Yes LLE Weight Bearing: Non weight bearing Other Position/Activity Restrictions: NWB L LE Vital Signs   Pain Pain Assessment Pain Scale: 0-10 Pain Score: 0-No pain Vision/Perception  Perception Perception: Within Functional Limits Praxis Praxis: Intact  Cognition Overall Cognitive Status: Within Functional Limits for tasks assessed Arousal/Alertness:  Awake/alert Orientation Level: Disoriented X4 Attention: Selective Selective Attention: Appears intact Memory: Appears intact Awareness: Appears intact Problem Solving: Appears intact Safety/Judgment: Appears intact Sensation Sensation Light Touch: Impaired by gross assessment Light Touch Impaired Details: Impaired LLE Coordination Gross Motor Movements are Fluid and Coordinated: Yes Fine Motor Movements are Fluid and Coordinated: Yes Motor  Motor Motor: Abnormal postural alignment and control Motor - Skilled Clinical Observations: decreased postural control due to new L BKA with R BKA with prosthesis  Mobility Bed Mobility Bed Mobility: Rolling Left;Supine to Sit;Sit to Supine;Rolling Right Rolling Right: Independent with assistive device Rolling Left: Independent with assistive device Supine to Sit: Independent with assistive device Sit to Supine: Independent with assistive device Scooting to [facility]: Independent with assistive device Transfers Transfers: Sit to Stand;Stand to Sit;Lateral/Scoot Transfers Sit to Stand: Contact Guard/Touching assist (// bars) Stand to Sit: Contact Guard/Touching assist (// bars) Lateral/Scoot Transfers: Independent with assistive device (W/c>bed) Transfer (Assistive device): Rolling walker Locomotion  Gait Ambulation: No Gait Gait: No Stairs / Additional Locomotion Stairs: No Ramp: Nurse, mental health Mobility: Yes Wheelchair Assistance: Independent with Camera operator: Both upper extremities Wheelchair Parts Management: Independent Distance: 150  Trunk/Postural Assessment  Cervical Assessment Cervical Assessment: Within Functional Limits Thoracic Assessment Thoracic Assessment: Within Functional Limits Lumbar Assessment Lumbar Assessment: Within Functional Limits Postural Control Postural Control: Within Functional Limits  Balance Balance Balance Assessed:  Yes Static Sitting Balance Static Sitting - Balance Support: Feet supported (R prosthetic in place) Static Sitting - Level of Assistance: 6: Modified independent (Device/Increase time) Dynamic Sitting Balance Dynamic Sitting - Balance Support: During functional activity Dynamic Sitting - Level of Assistance: 6: Modified independent (Device/Increase time) Dynamic Sitting - Balance Activities: Lateral lean/weight shifting;Forward lean/weight shifting;Reaching for objects;Reaching across midline Static Standing Balance Static Standing - Balance Support: Left  upper extremity supported Static Standing - Level of Assistance: 5: Stand by assistance Dynamic Standing Balance Dynamic Standing - Level of Assistance: 5: Stand by assistance Extremity Assessment      RLE Assessment RLE Assessment: Exceptions to [facility] General Strength Comments: Grossly in sitting 4+/5 throughout at hip and kne jts LLE Assessment LLE Assessment: Exceptions to [facility] Active Range of Motion (AROM) Comments: WFL General Strength Comments: Grossly at least 4/5 througout available range, limited formal testing by NWB status and pain   SESSION INTERVENTIONS: pt received in  [facility]  and agreeable to therapy. Wife present. Pt directed in [facility] mobility to ortho gym completed 175' mod I then requested therapist continue distance, total A for energy. Pt directed in ascending/descending ramp in WC, CGA with x2 attempts in initiate over lip of ramp however able to complete safely. Attempted to direct pt in car transfer however car transfer bench not present and is under repair. Pt reports he has completed car transfer in simulation car and in previous session completed it in personal car with therapy and reports feeling comfortable with transfer for home, wife verifies and agrees. Pt directed in 2x5 Sit to stand to Rolling walker at [place], good safety awareness noted. Pt directed in NMRE activities: 2x10 under hand bean bag toss with first rep  reaching for bags on R side, SBA no LOB noted, second rep reaching for objects with R hand cross midline, initially pt unable to reach fully to L side without LOB, targets moved closer to midline and pt able to safely reach to L side slightly without LOB and complete task at SBA safely. Pt directed in Stand pivot transfer with Rolling walker to and from standard chair at [place] with proper setup of walker and WC noted. Pt directed in [place] mobility to room mod I, wife had several questions about DC and hospital bed for home and HEP. Pt and wife educated on HEP handout pt previously given, pt reports he understands all activities and frequency, wife very supportive and agreeable. Pt left in room, alarm belt set, All needs in reach and in good condition. Call light in hand.  Pt denied all needs and reports comfortable with DC home at current level .    Junie Panning 11/28/2019, 12:07 PM

## 2019-11-28 NOTE — Progress Notes (Signed)
Occupational Therapy Session Note  Patient Details  Name: Anthony Bullock MRN: [id number] Date of Birth: January 23, 1960  Today's Date: 11/28/2019 OT Individual Time: 0700-0808 OT Individual Time Calculation (min): 68 min    Short Term Goals: Week 2:  OT Short Term Goal 1 (Week 2): STG=LTG secondary to ELOS  Skilled Therapeutic Interventions/Progress Updates:    Pt resting in w/c upon arrival.  Per pt's report, pt completed bathing/dressing without assistance after setup by staff. OT intervention with focus on squat pivot transfers (supervision), BUE therex, standing balance, ongoing education and discharge planning, and safety awareness. BUE therex on [employer] for total of 12 mins (7 mins random level 3 and 5 mins random level 3 in opposite direction). W/c mobility in hallway and simulated home envirionment with supervision. Standing balance activities at BITS X 6 with varying difficulty. Pt reaching across midline with RUE to hit target on his L. Pt with good awareness of limitations and no unsafe behaviors or LOB noted. Pt returned to room and remained in w/c with all needs within reach. Pt pleased with progress and ready for discharge home tomorrow.   Therapy Documentation Precautions:  Precautions Precautions: Fall, Other (comment) Precaution Comments: L BKA wound vac and limb guard; h/o R BKA (prosthetic in room) Restrictions Weight Bearing Restrictions: Yes LLE Weight Bearing: Non weight bearing Other Position/Activity Restrictions: NWB L LE Pain: Pain Assessment Pain Scale: 0-10 Pain Score: 0-No pain    Therapy/Group: Individual Therapy  Leroy Libman 11/28/2019, 8:09 AM

## 2019-11-28 NOTE — Progress Notes (Signed)
Hypoglycemic Event  CBG: 53  Treatment: 4 oz juice/soda  Symptoms: None  Follow-up CBG: Time:1235 CBG Result:67   Possible Reasons for Event: Medication regimen  Comments/MD notified:protocol followed    Collene Leyden Marlos Carmen

## 2019-11-28 NOTE — Progress Notes (Signed)
Occupational Therapy Session Note  Patient Details  Name: Anthony Bullock MRN: [id number] Date of Birth: [birth year]  Today's Date: 11/28/2019 OT Individual Time: 8833-7445 OT Individual Time Calculation (min): 60 min (missed 15 min due to fatigue)   Short Term Goals: Week 1:  OT Short Term Goal 1 (Week 1): Pt will complete 3/3 components of toileting with Min A OT Short Term Goal 1 - Progress (Week 1): Progressing toward goal OT Short Term Goal 2 (Week 1): Pt will be able to independently state 2 pressure relief strategies to implement when sitting OOB OT Short Term Goal 2 - Progress (Week 1): Met OT Short Term Goal 3 (Week 1): Pt will complete 2 grooming tasks while sitting at the sink to increase OOB tolerance OT Short Term Goal 3 - Progress (Week 1): Met Week 2:  OT Short Term Goal 1 (Week 2): STG=LTG secondary to ELOS  Skilled Therapeutic Interventions/Progress Updates:    pt seen this session to focus on UB strength and activity tolerance. Pt received in wc and agreeable to therapy. Pt self propelled wc to gym.  Worked on a circuit 3x of: 3# dowel chest press 15x 3# dowel tricep press 15 x 3 # weights of bent elbow shoulder abduction 15x 3# weights of elbow flexion Shoulder retraction rows using bar and Level 2 theraband Level 2 theraband of B arm extension for sh horizontal abduction  Pt had short rest breaks between sets and worked on [employer].   Pt self propelled towards [name] and then back towards his room taking several rest breaks. Pt stated he was quite fatigued at this point and wanted to stop and rest.  He requested to just stay in his wc as lunch was coming soon. Pt in room with all needs met. He participated very well today.   Therapy Documentation Precautions:  Precautions Precautions: Fall, Other (comment) Precaution Comments: L BKA wound vac and limb guard; h/o R BKA (prosthetic in room) Restrictions Weight Bearing Restrictions: Yes LLE Weight  Bearing: Non weight bearing Other Position/Activity Restrictions: NWB L LE    Vital Signs: Therapy Vitals Temp: 98.1 F (36.7 C) Pulse Rate: 70 BP: (!) 153/62 Patient Position (if appropriate): Lying Oxygen Therapy SpO2: 99 % O2 Device: Room Air Pain: Pain Assessment Pain Scale: 0-10 Pain Score: 0-No pain ADL: ADL Eating: Not assessed Grooming: Setup Where Assessed-Grooming: Edge of bed Upper Body Bathing: Setup Where Assessed-Upper Body Bathing: Edge of bed Lower Body Bathing: Moderate assistance Where Assessed-Lower Body Bathing: Edge of bed, Bed level Upper Body Dressing: Setup Where Assessed-Upper Body Dressing: Edge of bed Lower Body Dressing: Contact guard Where Assessed-Lower Body Dressing: Edge of bed Toileting: Moderate assistance Where Assessed-Toileting: Bedside Commode Toilet Transfer: Other (comment) (AP transfer) Tub/Shower Transfer: Not assessed   Therapy/Group: Individual Therapy  [place] 11/28/2019, 8:56 AM

## 2019-11-28 NOTE — Progress Notes (Signed)
[facility] PHYSICAL MEDICINE & REHABILITATION PROGRESS NOTE   Subjective/Complaints:   Pt reports pain still requiring tylenol prn- but hasn't taken oxy in "forever".   Says bowels working well- ready for d/c tomorrow.  Explained will send home on a few days of Keflex- a total of 10 days.   ROS:  Pt denies SOB, abd pain, CP, N/V/C/D, and vision changes   Objective:   No results found. Recent Labs    11/26/19 1400  WBC 7.7  HGB 9.7*  HCT 31.1*  PLT 134*   Recent Labs    11/26/19 1400 11/26/19 1951  NA 133* 134*  K 5.0 3.9  CL 92* 97*  CO2 25 27  GLUCOSE 83 88  BUN 83* 25*  CREATININE 12.70* 5.84*  CALCIUM 9.0 8.6*    Intake/Output Summary (Last 24 hours) at 11/28/2019 1035 Last data filed at 11/28/2019 0700 Gross per 24 hour  Intake 460 ml  Output --  Net 460 ml     Pressure Injury 11/16/19 Buttocks Left Stage 2 -  Partial thickness loss of dermis presenting as a shallow open injury with a red, pink wound bed without slough. dry shiny nonblanchable area what appears to be a healing stage 2 (Active)  11/16/19 1435  Location: Buttocks  Location Orientation: Left  Staging: Stage 2 -  Partial thickness loss of dermis presenting as a shallow open injury with a red, pink wound bed without slough.  Wound Description (Comments): dry shiny nonblanchable area what appears to be a healing stage 2  Present on Admission: Yes     Pressure Injury 11/16/19 Buttocks Right Stage 2 -  Partial thickness loss of dermis presenting as a shallow open injury with a red, pink wound bed without slough. nonblanchable area what appears to be a healing stage 2 with dry flacky skin covering (Active)  11/16/19 1435  Location: Buttocks  Location Orientation: Right  Staging: Stage 2 -  Partial thickness loss of dermis presenting as a shallow open injury with a red, pink wound bed without slough.  Wound Description (Comments): nonblanchable area what appears to be a healing stage 2 with  dry flacky skin covering  Present on Admission: Yes    Physical Exam: Vital Signs Blood pressure (!) 153/62, pulse 70, temperature 98.1 F (36.7 C), resp. rate 17, height 4' 9.09" (1.45 m), weight 83 kg, SpO2 99 %.  General: sitting up in w/c- able to take off L limb guard and show me L BKA, bright, NAD Mood and affect - bright Heart: RRR Lungs: CTA B/L- no W/R/R- good air movement Abdomen: Soft, NT, ND, (+)BS  Extremities: No clubbing, cyanosis, or edema Skin: No evidence of breakdown, no evidence of rash L BKA- no redness left on L BKA- still some dried blood at corners/ dog ears coming inwards- not bulbous anymore- no increased warmth Neurologic: Cranial nerves II through XII intact, motor strength is 5/5 in bilateral deltoid, bicep, tricep, grip, hip flexor (s/p B BKA)   ,Musculoskeletal: Full range of motion in all 4 extremities. No joint swelling  Bilaterl BKA         Assessment/Plan: 1. Functional deficits secondary to left BKA which require 3+ hours per day of interdisciplinary therapy in a comprehensive inpatient rehab setting.  Physiatrist is providing close team supervision and 24 hour management of active medical problems listed below.  Physiatrist and rehab team continue to assess barriers to discharge/monitor patient progress toward functional and medical goals  Care Tool:  Bathing  Body parts bathed by patient: Right arm, Left arm, Chest, Abdomen, Front perineal area, Right upper leg, Left upper leg, Face, Buttocks   Body parts bathed by helper: Buttocks Body parts n/a: Right lower leg, Left lower leg   Bathing assist Assist Level: Supervision/Verbal cueing     Upper Body Dressing/Undressing Upper body dressing   What is the patient wearing?: Pull over shirt    Upper body assist Assist Level: Independent    Lower Body Dressing/Undressing Lower body dressing      What is the patient wearing?: Underwear/pull up, Pants, Ace wrap/stump shrinker      Lower body assist Assist for lower body dressing: Supervision/Verbal cueing     Toileting Toileting    Toileting assist Assist for toileting: Supervision/Verbal cueing     Transfers Chair/bed transfer  Transfers assist  Chair/bed transfer activity did not occur: Safety/medical concerns  Chair/bed transfer assist level: Supervision/Verbal cueing     Locomotion Ambulation   Ambulation assist   Ambulation activity did not occur: Safety/medical concerns (new L BKA with R prosthesis, decreased strength/balance)          Walk 10 feet activity   Assist  Walk 10 feet activity did not occur: Safety/medical concerns        Walk 50 feet activity   Assist Walk 50 feet with 2 turns activity did not occur: Safety/medical concerns         Walk 150 feet activity   Assist Walk 150 feet activity did not occur: Safety/medical concerns         Walk 10 feet on uneven surface  activity   Assist Walk 10 feet on uneven surfaces activity did not occur: Safety/medical concerns         Wheelchair     Assist Will patient use wheelchair at discharge?: Yes Type of Wheelchair: Manual    Wheelchair assist level: Independent Max wheelchair distance: 150'    Wheelchair 50 feet with 2 turns activity    Assist        Assist Level: Independent   Wheelchair 150 feet activity     Assist      Assist Level: Independent   Blood pressure (!) 153/62, pulse 70, temperature 98.1 F (36.7 C), resp. rate 17, height 4' 9.09" (1.45 m), weight 83 kg, SpO2 99 %.   Medical Problem List and Plan: 1.  Impaired mobility and ADLs secondary to left BKA             -patient may not shower  11/13- got Limb guard fixed- happy about that  11/17- changed Keflex to 10 days.              -ELOS/Goals: tent dc 11/19  Team conf in am  2.  Antithrombotics: -DVT/anticoagulation:  Pharmaceutical: Heparin             -antiplatelet therapy: ASA 81 mg daily 3. Pain  Management:  Oxycodone prn managing pain well.              --changed Gabapentin to 100mg  daily and 200mg  HS to better help with sleep at night, patient agreeable  11/18- just taking tylenol- won't go home with oxycodone 4. Mood: LCSW to follow for evaluation and support.              -antipsychotic agents: N/A 5. Neuropsych: This patient is capable of making decisions on his own behalf. 6. Skin/Wound Care: Routine pressure relief measures.              --  Wound VAC to stay in place till 11/10  11/9--Stage 2 pressure injury to bilateral buttocks and sacrum- monitor daily and offload q2H. con't wound care   11/9- VAC off Wednesday  11/11- said pain better controlled without VAC on-   11/12- will start Keflex base don HD_ 500 mg BID due to deep red spot- could be bruise, could be spreading infection of skin-   11/13- looks smaller- con't keflex for now since getting better  11/14- looking less red/smaller area affected daily- con't keflex  11/16- redness >75% improved- a little drainage- ?pus? From incision, but WBC looks great, and redness much improved- con't Keflex.   11/17- change keflex to max of 10 days. WBC 7.7  11/18- explained to pt will send home on 1-2 days of keflex to finish out 10 days. - redness GONE, no drainage.  7. Fluids/Electrolytes/Nutrition: Strict I/O.             -- Renal/CM diet with 1200 cc/FR 8. HTN : Monitor BP tid--continue Amlodipine, coreg and Furosemide 9. ESRD: HD - TTS with nephrology following for assist.              --Schedule HD at the end of the day to help with tolerance of therapy.   -Appreciate nephrology following.  10. T2DM with neuropathy/nephropathy: Hgb A1c-6.5 and well controlled.  Reports was on Basaglar 12 units at bedtime with meal coverage/SSI.                            --Lantus decreased to 25 units on 11/05. May need to decrease further as activity increases. CBG (last 3)  Recent Labs    11/27/19 1632 11/27/19 2132 11/28/19 0541   GLUCAP 134* 178* 87   11/16- pt insists to decrease Lantus to 12 units and will use SSI to get control- so can "eat less".   11/17- no snack last night- will con't lantus for now- and see if he eats snack.   11/18- BGs actually much better- 87-178- con't regimen 11.NICM s/p AICD: Monitor for symptoms with increase in mobility.  Continue Lipitor, coreg, ASA, fenofibrate and Lasix  12. Nausea/constipation: Has been intermittent. Question due to constipation--will change colace to Senna S and add pepcid for dyspepsia.   11/9- vomting this AM- no constipation that we can tell- con't zofran prn  11/11- LBM 4 days ago and no BM for day prior to that- ordered miralax, increased senokot- and ordered sorbitol prn  11/12- pt said better- will monitor  11/14- resolved 13. Overweight (BMI 25.46): Dietary consult ordered for education.  14. HTN with hypotension  11/8- will reduce Norvasc to 5 mg daily. - will start in AM  11/9- BP slightly elevated this AM 152/66- will monitor- might need to increase Norvasc to 7.5 mg daily.  11/11- BP 122/48- this AM- held BP meds today due to hypotension in HD.   11/18- BP a little high, but has HD today and doesn't want to bottom out in HD, so doesn't take BP meds on these days.  15/ Hyperkalemia  11/11- K+ 6.3- per nephrology- baths in HD  11/13- K+ 5.0-   11/16- will recheck labs today with HD  11/17- K+ 3.9 LOS: 12 days A FACE TO FACE EVALUATION WAS PERFORMED  Taisei Bonnette 11/28/2019, 10:35 AM

## 2019-11-28 NOTE — Progress Notes (Signed)
[facility] Progress Note   Subjective:   No c/os feels good today. Dialysis today and plans for discharge tomorrow.   Objective Vitals:   11/26/19 1934 11/27/19 0525 11/27/19 1932 11/28/19 0538  BP: (!) 134/55 (!) 155/66 (!) 178/71 (!) 153/62  Pulse: 66 79 73 70  Resp:      Temp: 97.9 F (36.6 C) 98.2 F (36.8 C) 98 F (36.7 C) 98.1 F (36.7 C)  TempSrc:      SpO2: 100% 100% 100% 99%  Weight:      Height:       Physical Exam General: WDWN male in NAD Heart: RRR Lungs: CTAB Abdomen: soft, NTND Extremities:b/l BKA, no stump edema Dialysis Access: TDC, LU AVF maturing   Filed Weights   11/25/19 0500 11/26/19 1315 11/26/19 1700  Weight: 84.2 kg 85 kg 83 kg    Intake/Output Summary (Last 24 hours) at 11/28/2019 1008 Last data filed at 11/28/2019 0700 Gross per 24 hour  Intake 460 ml  Output --  Net 460 ml    Additional Objective Labs: Basic Metabolic Panel: Recent Labs  Lab 11/23/19 1352 11/26/19 1400 11/26/19 1951  NA 134* 133* 134*  K 5.0 5.0 3.9  CL 94* 92* 97*  CO2 27 25 27   GLUCOSE 176* 83 88  BUN 61* 83* 25*  CREATININE 10.66* 12.70* 5.84*  CALCIUM 8.9 9.0 8.6*  PHOS 6.8* 6.1* 2.8   Liver Function Tests: Recent Labs  Lab 11/23/19 1352 11/26/19 1400 11/26/19 1951  ALBUMIN 2.6* 2.8* 2.8*   CBC: Recent Labs  Lab 11/21/19 1320 11/23/19 1352 11/26/19 1400  WBC 8.7 5.9 7.7  NEUTROABS  --   --  5.1  HGB 10.1* 10.1* 9.7*  HCT 33.0* 32.9* 31.1*  MCV 91.7 92.2 91.2  PLT 171 165 134*   CBG: Recent Labs  Lab 11/27/19 0700 11/27/19 1148 11/27/19 1632 11/27/19 2132 11/28/19 0541  GLUCAP 104* 147* 134* 178* 87    Medications:  . (feeding supplement) PROSource Plus  30 mL Oral BID BM  . amLODipine  5 mg Oral Daily  . aspirin EC  81 mg Oral Daily  . atorvastatin  80 mg Oral QHS  . carvedilol  25 mg Oral BID WC  . cephALEXin  500 mg Oral Q12H  . Chlorhexidine Gluconate Cloth  6 each Topical Q0600  . Chlorhexidine  Gluconate Cloth  6 each Topical Q0600  . cinacalcet  30 mg Oral Q T,Th,Sa-HD  . doxercalciferol  2.5 mcg Intravenous Q T,Th,Sa-HD  . famotidine  10 mg Oral QHS  . fenofibrate  160 mg Oral Daily  . furosemide  120 mg Oral Daily  . gabapentin  100 mg Oral Daily  . gabapentin  200 mg Oral QHS  . heparin  5,000 Units Subcutaneous Q8H  . insulin aspart  0-5 Units Subcutaneous QHS  . insulin aspart  0-9 Units Subcutaneous TID WC  . insulin aspart  2 Units Subcutaneous TID WC  . insulin glargine  12 Units Subcutaneous QHS  . lanthanum  2,000 mg Oral TID with meals  . lanthanum  500 mg Oral With snacks  . loratadine  10 mg Oral Daily  . multivitamin  1 tablet Oral QHS  . senna-docusate  2 tablet Oral QPC supper    [facility]600 2.2/5 bath TDC RIJ 83kg Hep 1000u +500units/hr - hect 2.5 ug tiw - epo 4000unitsIV tiw - sensipar 30mg po tiw  Assessment/Plan: 1.PAD- s/p L BKA on 11/3  by Dr. Sharol Given. Now in CIR. 2. ESRD- on HD TTS.   TDC needed TPA here last week x 1. Have started back his regular HD heparin.  3. Anemiaof CKD-Hgb stable. No ESA indicated at this time. 4. Secondary hyperparathyroidism- Ca at goal. Phos elevated. Continue binders, sensipar and hectorol. 5.HTN/volume- BP well controlled. At dry wt but should be 2 kg under w/ BKA, will challenge dry wt w/ HD today  6. Nutrition- Renal diet w/fluid restrictions. Alb low, continue protein supplements/renal vits.   Lynnda Child PA-C [facility] 11/28/2019,10:10 AM

## 2019-11-28 NOTE — Progress Notes (Signed)
Occupational Therapy Discharge Summary  Patient Details  Name: EPHRIAM TURMAN MRN: [id number] Date of Birth: 1960/02/22  Patient has met 8 of 8 long term goals due to improved activity tolerance, improved balance, ability to compensate for deficits and improved coordination.  Pt made excellent progress with BADLs and functional transfers during this admission.  Pt is supervision overall for bathing/dressing seated EOB and/or w/c. Pt performs drop arm BSC tranfsers with supervision and completes toileting tasks with lateral leans. Pt does not shower. Pt's wife has participated in therapy and provides the appropriate level of supervision/assistance.Patient to discharge at overall Supervision level.  Patient's care partner is independent to provide the necessary physical assistance at discharge.     Recommendation:  Patient will benefit from ongoing skilled OT services in home health setting to continue to advance functional skills in the area of BADL and iADL.  Equipment: drop arm BSC  Reasons for discharge: treatment goals met  Patient/family agrees with progress made and goals achieved: Yes  OT Discharge Vision Baseline Vision/History: Wears glasses Wears Glasses: At all times Patient Visual Report: No change from baseline Vision Assessment?: No apparent visual deficits Perception  Perception: Within Functional Limits Praxis Praxis: Intact Cognition Overall Cognitive Status: Within Functional Limits for tasks assessed Arousal/Alertness: Awake/alert Orientation Level: Oriented X4 Attention: Selective Selective Attention: Appears intact Memory: Appears intact Awareness: Appears intact Problem Solving: Appears intact Safety/Judgment: Appears intact Sensation Sensation Light Touch: Impaired by gross assessment Light Touch Impaired Details: Impaired LLE Coordination Gross Motor Movements are Fluid and Coordinated: Yes Fine Motor Movements are Fluid and Coordinated: Yes Finger  Nose Finger Test: Mildly tremulous bilaterally, per spouse this is baseline Motor  Motor Motor: Abnormal postural alignment and control Motor - Skilled Clinical Observations: decreased postural control due to new L BKA with R BKA with prosthesis Trunk/Postural Assessment  Cervical Assessment Cervical Assessment:  (forward head) Thoracic Assessment Thoracic Assessment:  (rounded shoulders) Lumbar Assessment Lumbar Assessment:  (posterior pelvic tilt)  Balance Static Sitting Balance Static Sitting - Balance Support: Feet supported Static Sitting - Level of Assistance: 6: Modified independent (Device/Increase time) Dynamic Sitting Balance Dynamic Sitting - Balance Support: During functional activity Dynamic Sitting - Level of Assistance: 6: Modified independent (Device/Increase time) Extremity/Trunk Assessment RUE Assessment RUE Assessment: Within Functional Limits LUE Assessment LUE Assessment: Within Functional Limits   Leroy Libman 11/28/2019, 6:45 AM

## 2019-11-28 NOTE — Progress Notes (Signed)
Patient ID: Anthony Bullock, male   DOB: [birth year], 59 y.o.   MRN: [id number]  SW received updates from [employer] that they were OOS on w/c, and all items will be delivered to home. SW expressed urgency on w/c needed, and inquired if there were w/c at retail store.  *Update that a w/c will be brought to [facility] for pt tomorrow since there will be a delivery tomorrow.  SW called pt wife Anthony Bullock [phone]) to see if there were any updates on hospital bed delivery. She said she has not received any phone calls. SW informed will follow-up. SW updated [employer] on information. Waiting on updates on when hospital bed will be delivered. *reports request sent to logistics to be assigned for delivery.   Loralee Pacas, MSW, [place] Office: [phone] Cell: [phone] Fax: [phone]

## 2019-11-29 ENCOUNTER — Telehealth: Payer: Self-pay | Admitting: Orthopedic Surgery

## 2019-11-29 LAB — GLUCOSE, CAPILLARY: Glucose-Capillary: 107 mg/dL — ABNORMAL HIGH (ref 70–99)

## 2019-11-29 MED ORDER — GABAPENTIN 100 MG PO CAPS: ORAL_CAPSULE | ORAL | 0 refills | Status: DC

## 2019-11-29 MED ORDER — PROSOURCE PLUS PO LIQD: 30.0000 mL | Freq: Two times a day (BID) | ORAL | 0 refills | Status: DC

## 2019-11-29 MED ORDER — AMLODIPINE BESYLATE 10 MG PO TABS: 5.0000 mg | ORAL_TABLET | Freq: Every day | ORAL | 0 refills | Status: DC

## 2019-11-29 MED ORDER — ACETAMINOPHEN 325 MG PO TABS
325.0000 mg | ORAL_TABLET | ORAL | Status: DC | PRN
Start: 2019-11-29 — End: 2023-10-20

## 2019-11-29 MED ORDER — CEPHALEXIN 500 MG PO CAPS: 500.0000 mg | ORAL_CAPSULE | Freq: Two times a day (BID) | ORAL | 0 refills | Status: DC

## 2019-11-29 MED ORDER — CARVEDILOL 25 MG PO TABS: 25.0000 mg | ORAL_TABLET | Freq: Two times a day (BID) | ORAL | 0 refills | Status: DC

## 2019-11-29 MED ORDER — BASAGLAR KWIKPEN 100 UNIT/ML ~~LOC~~ SOPN: PEN_INJECTOR | SUBCUTANEOUS | 11 refills | Status: DC

## 2019-11-29 MED ORDER — FAMOTIDINE 10 MG PO TABS: 10.0000 mg | ORAL_TABLET | Freq: Every day | ORAL | 0 refills | Status: DC

## 2019-11-29 MED ORDER — SENNOSIDES-DOCUSATE SODIUM 8.6-50 MG PO TABS: 2.0000 | ORAL_TABLET | Freq: Every day | ORAL | 0 refills | Status: DC

## 2019-11-29 MED ORDER — CINACALCET HCL 30 MG PO TABS: 30.0000 mg | ORAL_TABLET | ORAL | Status: DC

## 2019-11-29 MED ORDER — DOXERCALCIFEROL 4 MCG/2ML IV SOLN: 2.5000 ug | INTRAVENOUS | Status: DC

## 2019-11-29 MED ORDER — ONDANSETRON HCL 4 MG PO TABS: 4.0000 mg | ORAL_TABLET | Freq: Four times a day (QID) | ORAL | 0 refills | Status: DC | PRN

## 2019-11-29 NOTE — Progress Notes (Signed)
[place] PHYSICAL MEDICINE & REHABILITATION PROGRESS NOTE   Subjective/Complaints:   Pt reports ready for d/c today. Had episode of diarrhea/loose stools x3 yesterday and N/V at 0500 this AM.  Had Zofran and feels much better.   Wants to go home with zofran prn.    ROS:  Pt denies SOB, abd pain, CP, N/V/C/D, and vision changes   Objective:   No results found. Recent Labs    11/26/19 1400 11/28/19 1322  WBC 7.7 8.0  HGB 9.7* 10.0*  HCT 31.1* 32.1*  PLT 134* 148*   Recent Labs    11/26/19 1951 11/28/19 1322  NA 134* 134*  K 3.9 5.6*  CL 97* 96*  CO2 27 26  GLUCOSE 88 139*  BUN 25* 63*  CREATININE 5.84* 10.21*  CALCIUM 8.6* 9.2    Intake/Output Summary (Last 24 hours) at 11/29/2019 0834 Last data filed at 11/28/2019 1742 Gross per 24 hour  Intake 120 ml  Output 1500 ml  Net -1380 ml     Pressure Injury 11/16/19 Buttocks Left Stage 2 -  Partial thickness loss of dermis presenting as a shallow open injury with a red, pink wound bed without slough. dry shiny nonblanchable area what appears to be a healing stage 2 (Active)  11/16/19 1435  Location: Buttocks  Location Orientation: Left  Staging: Stage 2 -  Partial thickness loss of dermis presenting as a shallow open injury with a red, pink wound bed without slough.  Wound Description (Comments): dry shiny nonblanchable area what appears to be a healing stage 2  Present on Admission: Yes     Pressure Injury 11/16/19 Buttocks Right Stage 2 -  Partial thickness loss of dermis presenting as a shallow open injury with a red, pink wound bed without slough. nonblanchable area what appears to be a healing stage 2 with dry flacky skin covering (Active)  11/16/19 1435  Location: Buttocks  Location Orientation: Right  Staging: Stage 2 -  Partial thickness loss of dermis presenting as a shallow open injury with a red, pink wound bed without slough.  Wound Description (Comments): nonblanchable area what appears to be a  healing stage 2 with dry flacky skin covering  Present on Admission: Yes    Physical Exam: Vital Signs Blood pressure (!) 134/55, pulse 73, temperature 97.9 F (36.6 C), temperature source Oral, resp. rate 16, height 4' 9.09" (1.45 m), weight 80.6 kg, SpO2 100 %.  General: sitting up in bedside chair, appropriate, NAD Mood and affect - bright Heart: RRR Lungs: CTA B/L- no W/R/R- good air movement Abdomen: Soft, NT, ND, (+)BS  Extremities: No clubbing, cyanosis, or edema Skin: No evidence of breakdown, no evidence of rash L BKA- no redness left on L BKA- still some dried blood at corners/ dog ears coming inwards- not bulbous anymore- no increased warmth- not assessed today Neurologic: Cranial nerves II through XII intact, motor strength is 5/5 in bilateral deltoid, bicep, tricep, grip, hip flexor (s/p B BKA)   ,Musculoskeletal: Full range of motion in all 4 extremities. No joint swelling  Bilaterl BKA         Assessment/Plan: 1. Functional deficits secondary to left BKA which require 3+ hours per day of interdisciplinary therapy in a comprehensive inpatient rehab setting.  Physiatrist is providing close team supervision and 24 hour management of active medical problems listed below.  Physiatrist and rehab team continue to assess barriers to discharge/monitor patient progress toward functional and medical goals  Care Tool:  Bathing  Body parts bathed by patient: Right arm, Left arm, Chest, Abdomen, Front perineal area, Right upper leg, Left upper leg, Face, Buttocks   Body parts bathed by helper: Buttocks Body parts n/a: Right lower leg, Left lower leg   Bathing assist Assist Level: Supervision/Verbal cueing     Upper Body Dressing/Undressing Upper body dressing   What is the patient wearing?: Pull over shirt    Upper body assist Assist Level: Independent    Lower Body Dressing/Undressing Lower body dressing      What is the patient wearing?: Underwear/pull up,  Pants, Ace wrap/stump shrinker     Lower body assist Assist for lower body dressing: Supervision/Verbal cueing     Toileting Toileting    Toileting assist Assist for toileting: Supervision/Verbal cueing     Transfers Chair/bed transfer  Transfers assist  Chair/bed transfer activity did not occur: Safety/medical concerns  Chair/bed transfer assist level: Independent with assistive device (with squat pivot; with stand pivot and rolling walker supervision)     Locomotion Ambulation   Ambulation assist   Ambulation activity did not occur: Safety/medical concerns          Walk 10 feet activity   Assist  Walk 10 feet activity did not occur: Safety/medical concerns        Walk 50 feet activity   Assist Walk 50 feet with 2 turns activity did not occur: Safety/medical concerns         Walk 150 feet activity   Assist Walk 150 feet activity did not occur: Safety/medical concerns         Walk 10 feet on uneven surface  activity   Assist Walk 10 feet on uneven surfaces activity did not occur: Safety/medical concerns         Wheelchair     Assist Will patient use wheelchair at discharge?: Yes Type of Wheelchair: Manual    Wheelchair assist level: Independent Max wheelchair distance: 150'    Wheelchair 50 feet with 2 turns activity    Assist        Assist Level: Independent   Wheelchair 150 feet activity     Assist      Assist Level: Independent, Contact Guard/Touching assist   Blood pressure (!) 134/55, pulse 73, temperature 97.9 F (36.6 C), temperature source Oral, resp. rate 16, height 4' 9.09" (1.45 m), weight 80.6 kg, SpO2 100 %.   Medical Problem List and Plan: 1.  Impaired mobility and ADLs secondary to left BKA             -patient may not shower  11/13- got Limb guard fixed- happy about that  11/17- changed Keflex to 10 days.              -ELOS/Goals: tent dc 11/19  Team conf in am  2.   Antithrombotics: -DVT/anticoagulation:  Pharmaceutical: Heparin             -antiplatelet therapy: ASA 81 mg daily 3. Pain Management:  Oxycodone prn managing pain well.              --changed Gabapentin to 100mg  daily and 200mg  HS to better help with sleep at night, patient agreeable  11/18- just taking tylenol- won't go home with oxycodone  11/19- no oxy at d/c- just needs zofran prn and tylenol 4. Mood: LCSW to follow for evaluation and support.              -antipsychotic agents: N/A 5. Neuropsych: This patient is capable of making  decisions on his own behalf. 6. Skin/Wound Care: Routine pressure relief measures.              --Wound VAC to stay in place till 11/10  11/9--Stage 2 pressure injury to bilateral buttocks and sacrum- monitor daily and offload q2H. con't wound care   11/9- VAC off Wednesday  11/11- said pain better controlled without VAC on-   11/12- will start Keflex base don HD_ 500 mg BID due to deep red spot- could be bruise, could be spreading infection of skin-   11/13- looks smaller- con't keflex for now since getting better  11/14- looking less red/smaller area affected daily- con't keflex  11/16- redness >75% improved- a little drainage- ?pus? From incision, but WBC looks great, and redness much improved- con't Keflex.   11/17- change keflex to max of 10 days. WBC 7.7  11/18- explained to pt will send home on 1-2 days of keflex to finish out 10 days. - redness GONE, no drainage.   11/19- willl con't limb guard to stop from damage of L BKA 7. Fluids/Electrolytes/Nutrition: Strict I/O.             -- Renal/CM diet with 1200 cc/FR 8. HTN : Monitor BP tid--continue Amlodipine, coreg and Furosemide 9. ESRD: HD - TTS with nephrology following for assist.              --Schedule HD at the end of the day to help with tolerance of therapy.   -Appreciate nephrology following.  10. T2DM with neuropathy/nephropathy: Hgb A1c-6.5 and well controlled.  Reports was on Basaglar 12  units at bedtime with meal coverage/SSI.                            --Lantus decreased to 25 units on 11/05. May need to decrease further as activity increases. CBG (last 3)  Recent Labs    11/28/19 1842 11/28/19 2100 11/29/19 0615  GLUCAP 116* 207* 107*   11/16- pt insists to decrease Lantus to 12 units and will use SSI to get control- so can "eat less".   11/17- no snack last night- will con't lantus for now- and see if he eats snack.   11/18- BGs actually much better- 87-178- con't regimen 11.NICM s/p AICD: Monitor for symptoms with increase in mobility.  Continue Lipitor, coreg, ASA, fenofibrate and Lasix  12. Nausea/constipation: Has been intermittent. Question due to constipation--will change colace to Senna S and add pepcid for dyspepsia.   11/9- vomting this AM- no constipation that we can tell- con't zofran prn  11/11- LBM 4 days ago and no BM for day prior to that- ordered miralax, increased senokot- and ordered sorbitol prn  11/12- pt said better- will monitor  11/14- resolved  11/19- n/v this AM- gave zofran- feeling better- send home on zofran prn 13. Overweight (BMI 25.46): Dietary consult ordered for education.  14. HTN with hypotension  11/8- will reduce Norvasc to 5 mg daily. - will start in AM  11/9- BP slightly elevated this AM 152/66- will monitor- might need to increase Norvasc to 7.5 mg daily.  11/11- BP 122/48- this AM- held BP meds today due to hypotension in HD.   11/18- BP a little high, but has HD today and doesn't want to bottom out in HD, so doesn't take BP meds on these days.  15/ Hyperkalemia  11/11- K+ 6.3- per nephrology- baths in HD  11/13- K+ 5.0-   11/16-  will recheck labs today with HD  11/17- K+ 3.9  11/19- K+ 5.6- will need to be addressed on HD tomorrow.    LOS: 13 days A FACE TO FACE EVALUATION WAS PERFORMED  Urvi Imes 11/29/2019, 8:34 AM

## 2019-11-29 NOTE — Telephone Encounter (Signed)
Patient submitted medical release form, $25.00 check payment , and family leave FMLA. Accepted 11/29/19

## 2019-11-29 NOTE — Progress Notes (Signed)
  Pt reports nausea and while this nurse obtaining medication, pt has a a moderate thin, light yellow colored emesis. Vital signs stable, compazine 10mg  administered IM-pt reports relief. Will continue to monitor.Refused initiation of chair alarm, educated on fall risks and complications thereof. Pt is alert and oriented x4. Will continue to monitor.

## 2019-11-29 NOTE — Discharge Instructions (Signed)
Inpatient Rehab Discharge Instructions  JERMAL DISMUKE Discharge date and time: 11/29/19   Activities/Precautions/ Functional Status: Activity: no lifting, driving, or strenuous exercise till cleared by MD Diet: renal diet Wound Care: keep wound clean and dry    Functional status:  ___ No restrictions     ___ Walk up steps independently _X__ 24/7 supervision/assistance   ___ Walk up steps with assistance ___ Intermittent supervision/assistance  ___ Bathe/dress independently ___ Walk with walker     ___ Bathe/dress with assistance ___ Walk Independently    ___ Shower independently ___ Walk with assistance    _X__ Shower with assistance _X__ No alcohol     ___ Return to work/school ________   Special Instructions: 1. Monitor blood sugars before meals and at bedtime. Resume home sliding scale insulin and follow up with PCP for input on further adjustment if needed.    COMMUNITY REFERRALS UPON DISCHARGE:    Home Health:   PT   OT     RN                Agency: [facility]  Phone: [phone] *Please expect 2-3 days to schedule your home visit, if you have not received follow-up, be sure to contact the agency directly.   Medical Equipment/Items Ordered: hospital bed, wheelchair, drop arm bedside commode                                                 Agency/Supplier: [facility] [phone]  My questions have been answered and I understand these instructions. I will adhere to these goals and the provided educational materials after my discharge from the hospital.  Patient/Caregiver Signature _______________________________ Date __________  Clinician Signature _______________________________________ Date __________  Please bring this form and your medication list with you to all your follow-up doctor's appointments.

## 2019-11-29 NOTE — Progress Notes (Signed)
Renal Navigator faxed CIR note from today and Renal Note from yesterday to patient's outpatient HD clinic/[facility] in order to provide continuity of care.   Alphonzo Cruise, [place] Renal Navigator [phone]

## 2019-11-29 NOTE — Discharge Summary (Signed)
Physician Discharge Summary  Patient ID: Anthony Bullock MRN: [id number] DOB/AGE: [birth year] 58 y.o.  Admit date: 11/16/2019 Discharge date: 11/29/2019  Discharge Diagnoses:  Principal Problem:   Below-knee amputation of left lower extremity ([place]) Active Problems:   Poorly controlled type II diabetes mellitus with renal complication (HCC)   COPD (chronic obstructive pulmonary disease) (HCC)   ESRD on dialysis ([place])   Pressure injury of back, stage 2 (HCC)   Malnutrition of moderate degree   Discharged Condition: Stable  Significant Diagnostic Studies: N/A   Labs:  Basic Metabolic Panel: Recent Labs  Lab 11/26/19 1400 11/26/19 1951 11/28/19 1322  NA 133* 134* 134*  K 5.0 3.9 5.6*  CL 92* 97* 96*  CO2 25 27 26   GLUCOSE 83 88 139*  BUN 83* 25* 63*  CREATININE 12.70* 5.84* 10.21*  CALCIUM 9.0 8.6* 9.2  PHOS 6.1* 2.8 3.8    CBC: Recent Labs  Lab 11/26/19 1400 11/28/19 1322  WBC 7.7 8.0  NEUTROABS 5.1  --   HGB 9.7* 10.0*  HCT 31.1* 32.1*  MCV 91.2 92.5  PLT 134* 148*    CBG: Recent Labs  Lab 11/28/19 1235 11/28/19 1312 11/28/19 1842 11/28/19 2100 11/29/19 0615  GLUCAP 67* 90 116* 207* 107*    Brief HPI:   Anthony Bullock is a 59 y.o. male with history of T2DM with nephropathy and neuropathy, ESRD-HD, and ICM s/p AICD, left foot ulcer with osteomyelitis s/p toe amputation with poor wound healing and gangrenous changes.  He was admitted on 11/13/2019 for L-BKA by Dr. Sharol Given.  Postop hemodialysis has been ongoing on TTS.  He has had issues with hypoglycemia and insulin was being adjusted.  Therapy was ongoing and patient was noted to have limitations in mobility and ADLs.  CIR was recommended due to functional decline.   Hospital Course: Anthony Bullock was admitted to rehab 11/16/2019 for inpatient therapies to consist of PT and OT at least three hours five days a week. Past admission physiatrist, therapy team and rehab RN have worked together to provide customized  collaborative inpatient rehab. His blood pressures were monitored on TID basis and amlodipine was decreased to 5 mg/daily to prevent hypotension.  HD has been ongoing on at the end of the day to help with tolerance of therapy. Hyperkalemia has been managed with HD.  Follow-up CBC shows H&H is improving.  He continued to have intermittent issues with nausea which was managed with Zofran. Constipation has resolved with augmentation of bowel regimen.   Diabetes has been monitored on ac/hs basis and Lantus was decreased to 12 untis to prevent hypoglycemia. Wound VAC was resoved on 11/09 and Keflex added due to erythema on wound. Redness has resolved and he is to complete 10 day course of antibiotic at discharge. L-BKA incision is C/D/I with decrease in edema and sutures remain in place. Stage 2 on buttocks has healed well well with dry flaky skin.  Pain is controlled with prn use of tylenol. Gabapentin was modified to 100 mg in am and 200 mg/HS to help with insomnia.  He has progressed to supervision at wheelchair level.  He will continue to receive follow-up home health PT, OT and RN by [facility] after discharge.   Rehab course: During patient's stay in rehab weekly team conferences were held to monitor patient's progress, set goals and discuss barriers to discharge. At admission, patient required  Mod assist with mobility and min to mod assist with ADL tasks. He  has  had improvement in activity tolerance, balance, postural control as well as ability to compensate for deficits.  He is able to complete ADL tasks with supervision.  He requires supervision for transfers and is able to propel his wheelchair 150 feet at modified independent level.  Patient and wife have been educated regarding all aspects of care, safety and HEP.  Discharge disposition: 01-Home or Self Care  Diet: Renal diet/1200 cc/FR.   Special Instructions: 1.  Monitor blood sugars achs and follow-up with PCP for further adjustment  in insulin. 2.  Wash incision with soap and water, pat dry and cover with stump shrinker and limb guard device.   Discharge Instructions    Ambulatory referral to Physical Medicine Rehab   Complete by: As directed    3-4 weeks TC appt     Allergies as of 11/29/2019      Reactions   Epoetin Alfa Other (See Comments)   unknown   Ferumoxytol Other (See Comments)   unknown   Morphine Sulfate Rash, Other (See Comments)   Itches all over, red spots      Medication List    STOP taking these medications   docusate sodium 100 MG capsule Commonly known as: COLACE   Oxycodone HCl 10 MG Tabs     TAKE these medications   (feeding supplement) PROSource Plus liquid Take 30 mLs by mouth 2 (two) times daily between meals.   acetaminophen 325 MG tablet Commonly known as: TYLENOL Take 1-2 tablets (325-650 mg total) by mouth every 4 (four) hours as needed for mild pain. What changed:   when to take this  reasons to take this   albuterol 108 (90 Base) MCG/ACT inhaler Commonly known as: VENTOLIN HFA Inhale 2 puffs into the lungs every 4 (four) hours as needed for wheezing or shortness of breath.   amLODipine 10 MG tablet Commonly known as: NORVASC Take 0.5 tablets (5 mg total) by mouth daily. What changed: how much to take   aspirin EC 81 MG tablet Take 81 mg by mouth daily.   atorvastatin 80 MG tablet Commonly known as: LIPITOR Take 1 tablet (80 mg total) by mouth at bedtime.   azelastine 0.1 % nasal spray Commonly known as: Astelin Place 2 sprays into both nostrils 3 (three) times daily as needed for rhinitis. Use in each nostril as directed   Basaglar KwikPen 100 UNIT/ML INJECT 12 UNITS SUBCUTANEOUSLY AT BEDTIME What changed: additional instructions   BD Pen Needle Nano U/F 32G X 4 MM Misc Generic drug: Insulin Pen Needle USE 1 PEN NEEDLE SUBCUTANEOUSLY WITH INSULIN 4 TIMES DAILY   carvedilol 25 MG tablet Commonly known as: COREG Take 1 tablet (25 mg total) by  mouth 2 (two) times daily.   cephALEXin 500 MG capsule Commonly known as: KEFLEX Take 1 capsule (500 mg total) by mouth every 12 (twelve) hours.   cinacalcet 30 MG tablet Commonly known as: SENSIPAR Take 1 tablet (30 mg total) by mouth Every Tuesday,Thursday,and Saturday with dialysis. What changed: when to take this   doxercalciferol 4 MCG/2ML injection Commonly known as: HECTOROL Inject 1.25 mLs (2.5 mcg total) into the vein Every Tuesday,Thursday,and Saturday with dialysis. What changed: when to take this   famotidine 10 MG tablet Commonly known as: PEPCID Take 1 tablet (10 mg total) by mouth at bedtime.   fenofibrate 160 MG tablet Take 1 tablet (160 mg total) by mouth daily.   fexofenadine 180 MG tablet Commonly known as: ALLEGRA Take 180 mg by mouth daily.  FreeStyle Libre 14 Day Sensor Misc USE AS DIRECTED EVERY 14 DAYS   furosemide 40 MG tablet Commonly known as: LASIX Take 3 tablets by mouth once daily   gabapentin 100 MG capsule Commonly known as: NEURONTIN Take one pill in morning and two pills at bedtime. What changed:   medication strength  how much to take  how to take this  when to take this  additional instructions   glucose blood test strip Check 1 time daily. E11.9 One Touch Ultra Blue Test Strips   insulin lispro 200 UNIT/ML KwikPen Commonly known as: HumaLOG KwikPen Inject 10 Units into the skin 3 (three) times daily with meals. Use tid with meals as per sliding scale What changed:   how much to take  additional instructions   lanthanum 500 MG chewable tablet Commonly known as: FOSRENOL Chew 500-1,000 mg by mouth See admin instructions. Take 2000 mg with meals three time a day and 500 mg with snacks   multivitamin Tabs tablet Take 1 tablet by mouth daily.   ondansetron 4 MG tablet Commonly known as: ZOFRAN Take 1 tablet (4 mg total) by mouth every 6 (six) hours as needed for nausea.   Pataday 0.2 % Soln Generic drug:  Olopatadine HCl Place 1 drop into both eyes daily as needed (for allergies).   senna-docusate 8.6-50 MG tablet Commonly known as: Senokot-S Take 2 tablets by mouth daily after supper.       Follow-up Information    Lovorn, Jinny Blossom, MD Follow up.   Specialty: Physical Medicine and Rehabilitation Why: Office will call you with follow up appointment Contact information: [address] [place] [address] [phone]        [name], MD. Call.   Specialty: Family Medicine Why: for post hospital follow up Contact information: [place] [address] [phone]        Newt Minion, MD. Call.   Specialty: Orthopedic Surgery Why: for post op check.  Contact information: [address] [place] [address] [phone]               Signed: Bary Leriche 12/01/2019, 9:50 PM

## 2019-11-29 NOTE — Progress Notes (Signed)
Inpatient Rehabilitation Care Coordinator  Discharge Note  The overall goal for the admission was met for:   Discharge location: Yes. D/c to home with his wife  Length of Stay: Yes. 12 days.   Discharge activity level: Yes. Supervision.  Home/community participation: Yes. Limited  Services provided included: MD, RD, PT, OT, RN, CM, TR, Pharmacy, Neuropsych and SW  Financial Services: Medicare  Follow-up services arranged: Home Health: [facility] for HHPT/OT/SN and DME: [facility] for [facility] with left amputee pad, DABSC, hospital bed  Comments (or additional information): contact pt wife Manuela Schwartz [phone]  Patient/Family verbalized understanding of follow-up arrangements: Yes  Individual responsible for coordination of the follow-up plan: Pt to have assistance with coordinating care needs  Confirmed correct DME delivered: Rana Snare 11/29/2019    Loralee Pacas, MSW, [place] Office: [phone] Cell: [phone] Fax: [phone]

## 2019-11-30 DIAGNOSIS — E1140 Type 2 diabetes mellitus with diabetic neuropathy, unspecified: Secondary | ICD-10-CM | POA: Diagnosis not present

## 2019-11-30 DIAGNOSIS — Z89511 Acquired absence of right leg below knee: Secondary | ICD-10-CM | POA: Diagnosis not present

## 2019-11-30 DIAGNOSIS — Z992 Dependence on renal dialysis: Secondary | ICD-10-CM | POA: Diagnosis not present

## 2019-11-30 DIAGNOSIS — Z87891 Personal history of nicotine dependence: Secondary | ICD-10-CM | POA: Diagnosis not present

## 2019-11-30 DIAGNOSIS — N186 End stage renal disease: Secondary | ICD-10-CM | POA: Diagnosis not present

## 2019-11-30 DIAGNOSIS — E1122 Type 2 diabetes mellitus with diabetic chronic kidney disease: Secondary | ICD-10-CM | POA: Diagnosis not present

## 2019-11-30 DIAGNOSIS — J449 Chronic obstructive pulmonary disease, unspecified: Secondary | ICD-10-CM | POA: Diagnosis not present

## 2019-11-30 DIAGNOSIS — Z7982 Long term (current) use of aspirin: Secondary | ICD-10-CM | POA: Diagnosis not present

## 2019-11-30 DIAGNOSIS — S88112A Complete traumatic amputation at level between knee and ankle, left lower leg, initial encounter: Secondary | ICD-10-CM | POA: Diagnosis not present

## 2019-11-30 DIAGNOSIS — E785 Hyperlipidemia, unspecified: Secondary | ICD-10-CM | POA: Diagnosis not present

## 2019-11-30 DIAGNOSIS — D509 Iron deficiency anemia, unspecified: Secondary | ICD-10-CM | POA: Diagnosis not present

## 2019-11-30 DIAGNOSIS — Z89512 Acquired absence of left leg below knee: Secondary | ICD-10-CM | POA: Diagnosis not present

## 2019-11-30 DIAGNOSIS — Z95810 Presence of automatic (implantable) cardiac defibrillator: Secondary | ICD-10-CM | POA: Diagnosis not present

## 2019-11-30 DIAGNOSIS — Z8701 Personal history of pneumonia (recurrent): Secondary | ICD-10-CM | POA: Diagnosis not present

## 2019-11-30 DIAGNOSIS — Z4781 Encounter for orthopedic aftercare following surgical amputation: Secondary | ICD-10-CM | POA: Diagnosis not present

## 2019-11-30 DIAGNOSIS — I5022 Chronic systolic (congestive) heart failure: Secondary | ICD-10-CM | POA: Diagnosis not present

## 2019-11-30 DIAGNOSIS — M1990 Unspecified osteoarthritis, unspecified site: Secondary | ICD-10-CM | POA: Diagnosis not present

## 2019-11-30 DIAGNOSIS — E11649 Type 2 diabetes mellitus with hypoglycemia without coma: Secondary | ICD-10-CM | POA: Diagnosis not present

## 2019-11-30 DIAGNOSIS — J309 Allergic rhinitis, unspecified: Secondary | ICD-10-CM | POA: Diagnosis not present

## 2019-11-30 DIAGNOSIS — Z794 Long term (current) use of insulin: Secondary | ICD-10-CM | POA: Diagnosis not present

## 2019-11-30 DIAGNOSIS — D631 Anemia in chronic kidney disease: Secondary | ICD-10-CM | POA: Diagnosis not present

## 2019-11-30 DIAGNOSIS — N2581 Secondary hyperparathyroidism of renal origin: Secondary | ICD-10-CM | POA: Diagnosis not present

## 2019-11-30 DIAGNOSIS — I132 Hypertensive heart and chronic kidney disease with heart failure and with stage 5 chronic kidney disease, or end stage renal disease: Secondary | ICD-10-CM | POA: Diagnosis not present

## 2019-11-30 DIAGNOSIS — Z993 Dependence on wheelchair: Secondary | ICD-10-CM | POA: Diagnosis not present

## 2019-11-30 DIAGNOSIS — E1165 Type 2 diabetes mellitus with hyperglycemia: Secondary | ICD-10-CM | POA: Diagnosis not present

## 2019-12-02 ENCOUNTER — Telehealth: Payer: Self-pay | Admitting: *Deleted

## 2019-12-02 ENCOUNTER — Telehealth: Payer: Self-pay | Admitting: Family Medicine

## 2019-12-02 DIAGNOSIS — I5022 Chronic systolic (congestive) heart failure: Secondary | ICD-10-CM | POA: Diagnosis not present

## 2019-12-02 DIAGNOSIS — I132 Hypertensive heart and chronic kidney disease with heart failure and with stage 5 chronic kidney disease, or end stage renal disease: Secondary | ICD-10-CM | POA: Diagnosis not present

## 2019-12-02 DIAGNOSIS — E1122 Type 2 diabetes mellitus with diabetic chronic kidney disease: Secondary | ICD-10-CM | POA: Diagnosis not present

## 2019-12-02 DIAGNOSIS — D631 Anemia in chronic kidney disease: Secondary | ICD-10-CM | POA: Diagnosis not present

## 2019-12-02 DIAGNOSIS — N186 End stage renal disease: Secondary | ICD-10-CM | POA: Diagnosis not present

## 2019-12-02 DIAGNOSIS — Z4781 Encounter for orthopedic aftercare following surgical amputation: Secondary | ICD-10-CM | POA: Diagnosis not present

## 2019-12-02 NOTE — Telephone Encounter (Signed)
Damita Dunnings PT  [facility] called to request POC 2wk4.  Approval given.

## 2019-12-02 NOTE — Telephone Encounter (Signed)
Transition Care Management Unsuccessful Follow-up Telephone Call  Date of discharge and from where:  11/29/2019 from [facility]   Attempts:  1st Attempt  Reason for unsuccessful TCM follow-up call:  Left voice message

## 2019-12-03 DIAGNOSIS — D631 Anemia in chronic kidney disease: Secondary | ICD-10-CM | POA: Diagnosis not present

## 2019-12-03 DIAGNOSIS — Z992 Dependence on renal dialysis: Secondary | ICD-10-CM | POA: Diagnosis not present

## 2019-12-03 DIAGNOSIS — N2581 Secondary hyperparathyroidism of renal origin: Secondary | ICD-10-CM | POA: Diagnosis not present

## 2019-12-03 DIAGNOSIS — D509 Iron deficiency anemia, unspecified: Secondary | ICD-10-CM | POA: Diagnosis not present

## 2019-12-03 DIAGNOSIS — N186 End stage renal disease: Secondary | ICD-10-CM | POA: Diagnosis not present

## 2019-12-03 NOTE — Telephone Encounter (Signed)
Transition Care Management Follow-up Telephone Call  Date of discharge and from where: 11/29/2019 from [facility]   How have you been since you were released from the hospital? Patient states he is doing well. Pain has been good except for at bedtime   Any questions or concerns? No  Items Reviewed:  Did the pt receive and understand the discharge instructions provided? Yes   Medications obtained and verified? Yes   Other? No   Any new allergies since your discharge? Yes   Dietary orders reviewed? No  Do you have support at home? Yes   Home Care and Equipment/Supplies: Were home health services ordered? yes If so, what is the name of the agency? [facility]  Has the agency set up a time to come to the patient's home? yes Were any new equipment or medical supplies ordered?  No What is the name of the medical supply agency? N/A  Were you able to get the supplies/equipment? not applicable Do you have any questions related to the use of the equipment or supplies? No  Functional Questionnaire: (I = Independent and D = Dependent) ADLs: I  Bathing/Dressing- I  Meal Prep- I  Eating- I  Maintaining continence- I  Transferring/Ambulation- I with device   Managing Meds- I  Follow up appointments reviewed:   PCP Hospital f/u appt confirmed? No  Patient states that he does not wish to schedule at this time will call back after he see's the [facility] f/u appt confirmed? Yes  Scheduled to see Dr. Sharol Given  on 12/09/2019  @ 11:45 am.  Are transportation arrangements needed? No   If their condition worsens, is the pt aware to call PCP or go to the Emergency Dept.? Yes  Was the patient provided with contact information for the PCP's office or ED? Yes  Was to pt encouraged to call back with questions or concerns? Yes

## 2019-12-04 ENCOUNTER — Other Ambulatory Visit: Payer: Self-pay | Admitting: Family Medicine

## 2019-12-04 ENCOUNTER — Telehealth: Payer: Self-pay

## 2019-12-04 DIAGNOSIS — N186 End stage renal disease: Secondary | ICD-10-CM | POA: Diagnosis not present

## 2019-12-04 DIAGNOSIS — I132 Hypertensive heart and chronic kidney disease with heart failure and with stage 5 chronic kidney disease, or end stage renal disease: Secondary | ICD-10-CM | POA: Diagnosis not present

## 2019-12-04 DIAGNOSIS — E1122 Type 2 diabetes mellitus with diabetic chronic kidney disease: Secondary | ICD-10-CM | POA: Diagnosis not present

## 2019-12-04 DIAGNOSIS — I5022 Chronic systolic (congestive) heart failure: Secondary | ICD-10-CM | POA: Diagnosis not present

## 2019-12-04 DIAGNOSIS — D631 Anemia in chronic kidney disease: Secondary | ICD-10-CM | POA: Diagnosis not present

## 2019-12-04 DIAGNOSIS — Z4781 Encounter for orthopedic aftercare following surgical amputation: Secondary | ICD-10-CM | POA: Diagnosis not present

## 2019-12-04 MED ORDER — BD PEN NEEDLE NANO U/F 32G X 4 MM MISC: 0 refills | Status: DC

## 2019-12-04 NOTE — Telephone Encounter (Signed)
Marita Kansas, RN from [facility] called requesting HHSN 1wk1, 2wk2, 1wk6. Order approved and given.

## 2019-12-04 NOTE — Telephone Encounter (Signed)
Pt is calling in stating that he is out of his insulin Pen Needles (BD PEN NEEDLE NANO U/F) 32G x 4MM  Pharm:  [facility] in [place] [address].

## 2019-12-04 NOTE — Telephone Encounter (Signed)
Rx sent in

## 2019-12-05 DIAGNOSIS — D509 Iron deficiency anemia, unspecified: Secondary | ICD-10-CM | POA: Diagnosis not present

## 2019-12-05 DIAGNOSIS — D631 Anemia in chronic kidney disease: Secondary | ICD-10-CM | POA: Diagnosis not present

## 2019-12-05 DIAGNOSIS — N2581 Secondary hyperparathyroidism of renal origin: Secondary | ICD-10-CM | POA: Diagnosis not present

## 2019-12-05 DIAGNOSIS — Z992 Dependence on renal dialysis: Secondary | ICD-10-CM | POA: Diagnosis not present

## 2019-12-05 DIAGNOSIS — N186 End stage renal disease: Secondary | ICD-10-CM | POA: Diagnosis not present

## 2019-12-07 DIAGNOSIS — N186 End stage renal disease: Secondary | ICD-10-CM | POA: Diagnosis not present

## 2019-12-07 DIAGNOSIS — Z992 Dependence on renal dialysis: Secondary | ICD-10-CM | POA: Diagnosis not present

## 2019-12-07 DIAGNOSIS — D509 Iron deficiency anemia, unspecified: Secondary | ICD-10-CM | POA: Diagnosis not present

## 2019-12-07 DIAGNOSIS — D631 Anemia in chronic kidney disease: Secondary | ICD-10-CM | POA: Diagnosis not present

## 2019-12-07 DIAGNOSIS — N2581 Secondary hyperparathyroidism of renal origin: Secondary | ICD-10-CM | POA: Diagnosis not present

## 2019-12-09 ENCOUNTER — Encounter: Payer: Self-pay | Admitting: Orthopedic Surgery

## 2019-12-09 ENCOUNTER — Ambulatory Visit (INDEPENDENT_AMBULATORY_CARE_PROVIDER_SITE_OTHER): Payer: Medicare Other | Admitting: Physician Assistant

## 2019-12-09 VITALS — Ht <= 58 in | Wt 177.0 lb

## 2019-12-09 DIAGNOSIS — I96 Gangrene, not elsewhere classified: Secondary | ICD-10-CM

## 2019-12-09 NOTE — Progress Notes (Signed)
Office Visit Note   Patient: Anthony Bullock           Date of Birth: 02-Nov-1960           MRN: [id number] Visit Date: 12/09/2019              Requested by: [name], MD [place],  [place] [address] PCP: [name], MD  Chief Complaint  Patient presents with  . Left Leg - Routine Post Op    Left below knee amputation 11/13/2019      HPI: This is a pleasant gentleman who is 4 weeks status post left below-knee amputation.  He has pain on and off for which he uses Tylenol.  He has been doing home health physical therapy 2 times a week.  He is status post right below-knee amputation and has used [employer] in the past.  He would like to continue to use them now  Assessment & Plan: Visit Diagnoses: No diagnosis found.  Plan: Prescription was provided for a new BKA prosthetic for the left as well as a smaller size shrinker.  He will follow-up in 2 weeks sutures Staples were harvested today  Follow-Up Instructions: No follow-ups on file.   Ortho Exam  Patient is alert, oriented, no adenopathy, well-dressed, normal affect, normal respiratory effort. Focused examination well approximated wound edges he does some have some eschar on the lateral side with just a drop of bloody drainage.  No cellulitis no signs of infection no foul odor swelling is overall well controlled Patient is a new left transtibial  amputee.  Patient's current comorbidities are not expected to impact the ability to function with the prescribed prosthesis. Patient verbally communicates a strong desire to use a prosthesis. Patient currently requires mobility aids to ambulate without a prosthesis.  Expects not to use mobility aids with a new prosthesis.  Patient is a K2 level ambulator that will use a prosthesis to walk around their home and the community over low level environmental barriers.     Imaging: No results found. No images are attached to the encounter.  Labs: Lab  Results  Component Value Date   HGBA1C 6.5 (H) 11/13/2019   HGBA1C 8.1 (H) 07/10/2019   HGBA1C 8.9 (H) 08/06/2018   ESRSEDRATE 94 (H) 03/30/2016   ESRSEDRATE 18 11/30/2012   CRP 16.1 (H) 03/30/2016   REPTSTATUS 09/08/2019 FINAL 09/03/2019   GRAMSTAIN  03/31/2016    RARE WBC PRESENT, PREDOMINANTLY PMN NO ORGANISMS SEEN    CULT  09/03/2019    NO GROWTH 5 DAYS Performed at [facility] [address], [place], [place] [address]    [place] 03/31/2016   LABORGA ENTEROBACTER SPECIES 03/31/2016     Lab Results  Component Value Date   ALBUMIN 2.8 (L) 11/28/2019   ALBUMIN 2.8 (L) 11/26/2019   ALBUMIN 2.8 (L) 11/26/2019    Lab Results  Component Value Date   MG 2.1 09/04/2019   MG 1.7 08/18/2019   MG 2.1 12/03/2014   No results found for: VD25OH  No results found for: PREALBUMIN CBC EXTENDED Latest Ref Rng & Units 11/28/2019 11/26/2019 11/23/2019  WBC 4.0 - 10.5 K/uL 8.0 7.7 5.9  RBC 4.22 - 5.81 MIL/uL 3.47(L) 3.41(L) 3.57(L)  HGB 13.0 - 17.0 g/dL 10.0(L) 9.7(L) 10.1(L)  HCT 39 - 52 % 32.1(L) 31.1(L) 32.9(L)  PLT 150 - 400 K/uL 148(L) 134(L) 165  NEUTROABS 1.7 - 7.7 K/uL - 5.1 -  LYMPHSABS 0.7 - 4.0  K/uL - 1.2 -     Body mass index is 38.3 kg/m.  Orders:  No orders of the defined types were placed in this encounter.  No orders of the defined types were placed in this encounter.    Procedures: No procedures performed  Clinical Data: No additional findings.  ROS:  All other systems negative, except as noted in the HPI. Review of Systems  Objective: Vital Signs: Ht _0  (1.448 m)   Wt 177 lb (80.3 kg)   BMI 38.30 kg/m   Specialty Comments:  No specialty comments available.  PMFS History: Patient Active Problem List   Diagnosis Date Noted  . Malnutrition of moderate degree 11/18/2019  . Below-knee amputation of left lower extremity ([place]) 11/16/2019  . Abscess of left foot 11/13/2019  . Dehiscence of amputation stump  ([place])   . History of complete ray amputation of fifth toe of left foot ([place]) 10/09/2019  . Acute pulmonary edema (HCC)   . Osteomyelitis of fifth toe of left foot ([place])   . Febrile illness 08/17/2019  . SIRS (systemic inflammatory response syndrome) ([place]) 08/17/2019  . Thrombocytopenia ([place]) 08/17/2019  . Pressure injury of back, stage 2 ([place]) 08/17/2019  . Hypertensive emergency 07/10/2019  . Onychomycosis 08/16/2016  . Status post unilateral below knee amputation, right ([place]) 04/26/2016  . Bilateral carotid bruits 03/30/2016  . Diabetes mellitus with complication ([place])   . Anemia due to chronic kidney disease   . ESRD on dialysis ([place]) 11/30/2015  . Acute respiratory failure with hypoxia ([place]) 11/29/2015  . At high risk for falls 09/29/2014  . Peripheral vascular disease ([place]) 09/29/2014  . Osteoarthritis of both knees 01/07/2014  . Osteoarthritis of multiple joints 10/06/2013  . NICM (nonischemic cardiomyopathy) ([place]) 06/27/2013  . CAD (coronary artery disease) 03/13/2013  . Abnormal nuclear stress test 01/27/2013  . Chronic systolic congestive heart failure ([place]) 01/27/2013  . COPD (chronic obstructive pulmonary disease) ([place]) 12/27/2012  . Diabetic peripheral neuropathy ([place]) 02/10/2012  . URINARY CALCULUS 09/28/2009  . Adjustment disorder with depressed mood 08/21/2009  . Poorly controlled type II diabetes mellitus with renal complication ([place]) 93/57/0177  . Hyperlipidemia 04/14/2008  . Essential hypertension 04/14/2008  . ALLERGIC RHINITIS 04/14/2008   Past Medical History:  Diagnosis Date  . 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 Allergic rhinitis   . Anemia   . Arthritis   . Chronic systolic heart failure ([place])    a. ECHO (12/2012) EF 25-30%, HK entireanteroseptal myocardium //  b.  EF 25%, diffuse HK, grade 1 diastolic dysfunction, MAC, mild LAE, normal RVSF, trivial pericardial effusion  . COPD (chronic obstructive pulmonary  disease) ([place])   . Diabetes mellitus type II   . Diabetic nephropathy ([place])   . Diabetic neuropathy ([place])   . ESRD on hemodialysis [facility])    started HD June 2017, goes to [facility] HD unit, Dr Hinda Lenis  . History of cardiac catheterization    a.Myoview 1/15:  There is significant left ventricular dysfunction. There may be slight scar at the apex. There is no significant ischemia. LV Ejection Fraction: 27%  //  b. RHC/LHC (1/15) with mean RA 6, PA 47/22 mean 33, mean PCWP 20, PVR 2.5 WU, CI 2.5; 80% dLAD stenosis, 70% diffuse large D.   . History of kidney stones   . Hyperlipidemia   . Hypertension   . Kidney stones   . NICM (nonischemic cardiomyopathy) ([place])    Primarily nonischemic. Echo (12/14) with EF  25-30%. Echo (3/15) with EF 25%, mild to moderately dilated LV, normal RV size and systolic function.   . Osteomyelitis ([place])    left fifth ray  . Pneumonia   . Urethral stricture   . Wears glasses     Family History  Problem Relation Age of Onset  . Bladder Cancer Mother   . Alcohol abuse Father   . Melanoma Father   . Stroke Maternal Grandmother   . Heart Problems Maternal Grandmother        unknown  . Diabetes Maternal Grandmother   . Heart disease Maternal Grandfather   . Prostate cancer Maternal Grandfather     Past Surgical History:  Procedure Laterality Date  . ABDOMINAL AORTOGRAM W/LOWER EXTREMITY N/A 03/30/2016   Procedure: Abdominal Aortogram w/Lower Extremity;  Surgeon: Angelia Mould, MD;  Location: [place] CV LAB;  Service: Cardiovascular;  Laterality: N/A;  . AMPUTATION Right 04/26/2016   Procedure: Right Below Knee Amputation;  Surgeon: Newt Minion, MD;  Location: [place];  Service: Orthopedics;  Laterality: Right;  . AMPUTATION Left 08/21/2019   Procedure: LEFT FOOT 5TH RAY AMPUTATION;  Surgeon: Newt Minion, MD;  Location: [place];  Service: Orthopedics;  Laterality: Left;  . AMPUTATION Left 11/13/2019   Procedure: LEFT BELOW KNEE AMPUTATION;   Surgeon: Newt Minion, MD;  Location: [place];  Service: Orthopedics;  Laterality: Left;  . AV FISTULA PLACEMENT Right 09/08/2015   Procedure: INSERTION OF 4-41m x 45cm  ARTERIOVENOUS (AV) GORE-TEX GRAFT RIGHT UPPER  ARM;  Surgeon: CAngelia Mould MD;  Location: [place]  Service: Vascular;  Laterality: Right;  . AV FISTULA PLACEMENT Left 01/14/2016   Procedure: CREATION OF LEFT UPPER ARM ARTERIOVENOUS FISTULA;  Surgeon: CAngelia Mould MD;  Location: [facility]  Service: Vascular;  Laterality: Left;  . BASCILIC VEIN TRANSPOSITION Right 08/22/2014   Procedure: RIGHT UPPER ARM BASCILIC VEIN TRANSPOSITION;  Surgeon: CAngelia Mould MD;  Location: [place]  Service: Vascular;  Laterality: Right;  . BELOW KNEE LEG AMPUTATION Right 04/26/2016  . CARDIAC CATHETERIZATION    . CARDIAC DEFIBRILLATOR PLACEMENT  06/27/2013   Sub Q       BY DR KCaryl Comes . CATARACT EXTRACTION W/PHACO Right 08/06/2018   Procedure: CATARACT EXTRACTION PHACO AND INTRAOCULAR LENS PLACEMENT (IOC);  Surgeon: WBaruch Goldmann MD;  [facility];  Service: Ophthalmology;  Laterality: Right;  CDE: 4.06  . CATARACT EXTRACTION W/PHACO Left 08/20/2018   Procedure: CATARACT EXTRACTION PHACO AND INTRAOCULAR LENS PLACEMENT (IOC);  Surgeon: WBaruch Goldmann MD;  Location: [facility];  Service: Ophthalmology;  Laterality: Left;  CDE: 6.76  . COLONOSCOPY WITH PROPOFOL N/A 07/22/2015   Procedure: COLONOSCOPY WITH PROPOFOL;  Surgeon: HDoran Stabler MD;  [facility];  Service: Gastroenterology;  Laterality: N/A;  . FEMORAL-POPLITEAL BYPASS GRAFT Right 03/31/2016   Procedure: BYPASS GRAFT FEMORAL-POPLITEAL ARTERY USING RIGHT GREATER SAPHENOUS NONREVERSED VEIN;  Surgeon: CAngelia Mould MD;  Location: [place]  Service: Vascular;  Laterality: Right;  . HERNIA REPAIR    . I & D EXTREMITY Right 03/31/2016   Procedure: IRRIGATION AND DEBRIDEMENT FOOT;  Surgeon: CAngelia Mould MD;  Location: [place]  Service: Vascular;   Laterality: Right;  . IMPLANTABLE CARDIOVERTER DEFIBRILLATOR IMPLANT N/A 06/27/2013   Procedure: SUB Q ICD;  Surgeon: SDeboraha Sprang MD;  [facility];  Service: Cardiovascular;  Laterality: N/A;  . INTRAOPERATIVE ARTERIOGRAM Right 03/31/2016   Procedure: INTRA OPERATIVE ARTERIOGRAM;  Surgeon: CHarrell Gave  Nicole Cella, MD;  Location: [place];  Service: Vascular;  Laterality: Right;  . IR GENERIC HISTORICAL Right 11/30/2015   IR THROMBECTOMY AV FISTULA W/THROMBOLYSIS/PTA INC/SHUNT/IMG RIGHT 11/30/2015 Aletta Edouard, MD [facility]  . IR GENERIC HISTORICAL  11/30/2015   IR US GUIDE VASC ACCESS RIGHT 11/30/2015 Aletta Edouard, MD [facility]  . IR GENERIC HISTORICAL Right 12/15/2015   IR THROMBECTOMY AV FISTULA W/THROMBOLYSIS/PTA/STENT INC/SHUNT/IMG RT 12/15/2015 Arne Cleveland, MD [facility]  . IR GENERIC HISTORICAL  12/15/2015   IR US GUIDE VASC ACCESS RIGHT 12/15/2015 Arne Cleveland, MD [facility]  . IR GENERIC HISTORICAL  12/28/2015   IR FLUORO GUIDE CV LINE RIGHT 12/28/2015 Marybelle Killings, MD [facility]  . IR GENERIC HISTORICAL  12/28/2015   IR US GUIDE VASC ACCESS RIGHT 12/28/2015 Marybelle Killings, MD [facility]  . LEFT A ND RIGHT HEART CATH  01/30/2013   DR Sung Amabile  . LEFT AND RIGHT HEART CATHETERIZATION WITH CORONARY ANGIOGRAM N/A 01/30/2013   Procedure: LEFT AND RIGHT HEART CATHETERIZATION WITH CORONARY ANGIOGRAM;  Surgeon: Jolaine Artist, MD;  Location: [facility] LAB;  Service: Cardiovascular;  Laterality: N/A;  . PERIPHERAL VASCULAR CATHETERIZATION Right 01/26/2015   Procedure: A/V Fistulagram;  Surgeon: Angelia Mould, MD;  Location: [place] [facility];  Service: Cardiovascular;  Laterality: Right;  . reapea urethral surgery for recurrent obstruction  2011  . TOTAL KNEE ARTHROPLASTY Right 2007  . VEIN HARVEST Right 03/31/2016   Procedure: RIGHT GREATER SAPHENOUS VEIN HARVEST;  Surgeon: Angelia Mould, MD;  Location: [place];  Service: Vascular;  Laterality:  Right;   Social History   Occupational History  . Not on file  Tobacco Use  . Smoking status: Former Smoker    Packs/day: 2.00    Years: 32.00    Pack years: 64.00    Types: Cigarettes    Quit date: 05/11/2010    Years since quitting: 9.5  . Smokeless tobacco: Never Used  Vaping Use  . Vaping Use: Never used  Substance and Sexual Activity  . Alcohol use: No  . Drug use: No  . Sexual activity: Yes

## 2019-12-10 DIAGNOSIS — Z992 Dependence on renal dialysis: Secondary | ICD-10-CM | POA: Diagnosis not present

## 2019-12-10 DIAGNOSIS — D631 Anemia in chronic kidney disease: Secondary | ICD-10-CM | POA: Diagnosis not present

## 2019-12-10 DIAGNOSIS — D509 Iron deficiency anemia, unspecified: Secondary | ICD-10-CM | POA: Diagnosis not present

## 2019-12-10 DIAGNOSIS — N186 End stage renal disease: Secondary | ICD-10-CM | POA: Diagnosis not present

## 2019-12-10 DIAGNOSIS — N2581 Secondary hyperparathyroidism of renal origin: Secondary | ICD-10-CM | POA: Diagnosis not present

## 2019-12-11 DIAGNOSIS — I132 Hypertensive heart and chronic kidney disease with heart failure and with stage 5 chronic kidney disease, or end stage renal disease: Secondary | ICD-10-CM | POA: Diagnosis not present

## 2019-12-11 DIAGNOSIS — E1122 Type 2 diabetes mellitus with diabetic chronic kidney disease: Secondary | ICD-10-CM | POA: Diagnosis not present

## 2019-12-11 DIAGNOSIS — D631 Anemia in chronic kidney disease: Secondary | ICD-10-CM | POA: Diagnosis not present

## 2019-12-11 DIAGNOSIS — I5022 Chronic systolic (congestive) heart failure: Secondary | ICD-10-CM | POA: Diagnosis not present

## 2019-12-11 DIAGNOSIS — Z4781 Encounter for orthopedic aftercare following surgical amputation: Secondary | ICD-10-CM | POA: Diagnosis not present

## 2019-12-11 DIAGNOSIS — N186 End stage renal disease: Secondary | ICD-10-CM | POA: Diagnosis not present

## 2019-12-12 DIAGNOSIS — D509 Iron deficiency anemia, unspecified: Secondary | ICD-10-CM | POA: Diagnosis not present

## 2019-12-12 DIAGNOSIS — N186 End stage renal disease: Secondary | ICD-10-CM | POA: Diagnosis not present

## 2019-12-12 DIAGNOSIS — N2581 Secondary hyperparathyroidism of renal origin: Secondary | ICD-10-CM | POA: Diagnosis not present

## 2019-12-12 DIAGNOSIS — Z992 Dependence on renal dialysis: Secondary | ICD-10-CM | POA: Diagnosis not present

## 2019-12-12 DIAGNOSIS — E209 Hypoparathyroidism, unspecified: Secondary | ICD-10-CM | POA: Diagnosis not present

## 2019-12-12 DIAGNOSIS — D631 Anemia in chronic kidney disease: Secondary | ICD-10-CM | POA: Diagnosis not present

## 2019-12-13 DIAGNOSIS — D631 Anemia in chronic kidney disease: Secondary | ICD-10-CM | POA: Diagnosis not present

## 2019-12-13 DIAGNOSIS — I132 Hypertensive heart and chronic kidney disease with heart failure and with stage 5 chronic kidney disease, or end stage renal disease: Secondary | ICD-10-CM | POA: Diagnosis not present

## 2019-12-13 DIAGNOSIS — N186 End stage renal disease: Secondary | ICD-10-CM | POA: Diagnosis not present

## 2019-12-13 DIAGNOSIS — E1122 Type 2 diabetes mellitus with diabetic chronic kidney disease: Secondary | ICD-10-CM | POA: Diagnosis not present

## 2019-12-13 DIAGNOSIS — Z4781 Encounter for orthopedic aftercare following surgical amputation: Secondary | ICD-10-CM | POA: Diagnosis not present

## 2019-12-13 DIAGNOSIS — I5022 Chronic systolic (congestive) heart failure: Secondary | ICD-10-CM | POA: Diagnosis not present

## 2019-12-14 DIAGNOSIS — N2581 Secondary hyperparathyroidism of renal origin: Secondary | ICD-10-CM | POA: Diagnosis not present

## 2019-12-14 DIAGNOSIS — Z992 Dependence on renal dialysis: Secondary | ICD-10-CM | POA: Diagnosis not present

## 2019-12-14 DIAGNOSIS — D631 Anemia in chronic kidney disease: Secondary | ICD-10-CM | POA: Diagnosis not present

## 2019-12-14 DIAGNOSIS — N186 End stage renal disease: Secondary | ICD-10-CM | POA: Diagnosis not present

## 2019-12-14 DIAGNOSIS — E209 Hypoparathyroidism, unspecified: Secondary | ICD-10-CM | POA: Diagnosis not present

## 2019-12-14 DIAGNOSIS — D509 Iron deficiency anemia, unspecified: Secondary | ICD-10-CM | POA: Diagnosis not present

## 2019-12-16 ENCOUNTER — Ambulatory Visit: Payer: Medicare Other | Admitting: Physician Assistant

## 2019-12-16 DIAGNOSIS — Z23 Encounter for immunization: Secondary | ICD-10-CM | POA: Diagnosis not present

## 2019-12-17 DIAGNOSIS — D509 Iron deficiency anemia, unspecified: Secondary | ICD-10-CM | POA: Diagnosis not present

## 2019-12-17 DIAGNOSIS — E209 Hypoparathyroidism, unspecified: Secondary | ICD-10-CM | POA: Diagnosis not present

## 2019-12-17 DIAGNOSIS — Z992 Dependence on renal dialysis: Secondary | ICD-10-CM | POA: Diagnosis not present

## 2019-12-17 DIAGNOSIS — D631 Anemia in chronic kidney disease: Secondary | ICD-10-CM | POA: Diagnosis not present

## 2019-12-17 DIAGNOSIS — N186 End stage renal disease: Secondary | ICD-10-CM | POA: Diagnosis not present

## 2019-12-17 DIAGNOSIS — N2581 Secondary hyperparathyroidism of renal origin: Secondary | ICD-10-CM | POA: Diagnosis not present

## 2019-12-18 DIAGNOSIS — I132 Hypertensive heart and chronic kidney disease with heart failure and with stage 5 chronic kidney disease, or end stage renal disease: Secondary | ICD-10-CM | POA: Diagnosis not present

## 2019-12-18 DIAGNOSIS — Z4781 Encounter for orthopedic aftercare following surgical amputation: Secondary | ICD-10-CM | POA: Diagnosis not present

## 2019-12-18 DIAGNOSIS — I5022 Chronic systolic (congestive) heart failure: Secondary | ICD-10-CM | POA: Diagnosis not present

## 2019-12-18 DIAGNOSIS — D631 Anemia in chronic kidney disease: Secondary | ICD-10-CM | POA: Diagnosis not present

## 2019-12-18 DIAGNOSIS — E1122 Type 2 diabetes mellitus with diabetic chronic kidney disease: Secondary | ICD-10-CM | POA: Diagnosis not present

## 2019-12-18 DIAGNOSIS — N186 End stage renal disease: Secondary | ICD-10-CM | POA: Diagnosis not present

## 2019-12-19 DIAGNOSIS — D631 Anemia in chronic kidney disease: Secondary | ICD-10-CM | POA: Diagnosis not present

## 2019-12-19 DIAGNOSIS — Z992 Dependence on renal dialysis: Secondary | ICD-10-CM | POA: Diagnosis not present

## 2019-12-19 DIAGNOSIS — N186 End stage renal disease: Secondary | ICD-10-CM | POA: Diagnosis not present

## 2019-12-19 DIAGNOSIS — D509 Iron deficiency anemia, unspecified: Secondary | ICD-10-CM | POA: Diagnosis not present

## 2019-12-19 DIAGNOSIS — N2581 Secondary hyperparathyroidism of renal origin: Secondary | ICD-10-CM | POA: Diagnosis not present

## 2019-12-19 DIAGNOSIS — E209 Hypoparathyroidism, unspecified: Secondary | ICD-10-CM | POA: Diagnosis not present

## 2019-12-20 DIAGNOSIS — N186 End stage renal disease: Secondary | ICD-10-CM | POA: Diagnosis not present

## 2019-12-20 DIAGNOSIS — Z4781 Encounter for orthopedic aftercare following surgical amputation: Secondary | ICD-10-CM | POA: Diagnosis not present

## 2019-12-20 DIAGNOSIS — I132 Hypertensive heart and chronic kidney disease with heart failure and with stage 5 chronic kidney disease, or end stage renal disease: Secondary | ICD-10-CM | POA: Diagnosis not present

## 2019-12-20 DIAGNOSIS — D631 Anemia in chronic kidney disease: Secondary | ICD-10-CM | POA: Diagnosis not present

## 2019-12-20 DIAGNOSIS — E1122 Type 2 diabetes mellitus with diabetic chronic kidney disease: Secondary | ICD-10-CM | POA: Diagnosis not present

## 2019-12-20 DIAGNOSIS — I5022 Chronic systolic (congestive) heart failure: Secondary | ICD-10-CM | POA: Diagnosis not present

## 2019-12-21 DIAGNOSIS — N186 End stage renal disease: Secondary | ICD-10-CM | POA: Diagnosis not present

## 2019-12-21 DIAGNOSIS — E209 Hypoparathyroidism, unspecified: Secondary | ICD-10-CM | POA: Diagnosis not present

## 2019-12-21 DIAGNOSIS — Z992 Dependence on renal dialysis: Secondary | ICD-10-CM | POA: Diagnosis not present

## 2019-12-21 DIAGNOSIS — N2581 Secondary hyperparathyroidism of renal origin: Secondary | ICD-10-CM | POA: Diagnosis not present

## 2019-12-21 DIAGNOSIS — D509 Iron deficiency anemia, unspecified: Secondary | ICD-10-CM | POA: Diagnosis not present

## 2019-12-21 DIAGNOSIS — D631 Anemia in chronic kidney disease: Secondary | ICD-10-CM | POA: Diagnosis not present

## 2019-12-23 ENCOUNTER — Ambulatory Visit (INDEPENDENT_AMBULATORY_CARE_PROVIDER_SITE_OTHER): Payer: Medicare Other | Admitting: Physician Assistant

## 2019-12-23 ENCOUNTER — Encounter: Payer: Self-pay | Admitting: Physician Assistant

## 2019-12-23 VITALS — Ht <= 58 in | Wt 177.0 lb

## 2019-12-23 DIAGNOSIS — I96 Gangrene, not elsewhere classified: Secondary | ICD-10-CM

## 2019-12-23 NOTE — Progress Notes (Signed)
Office Visit Note   Patient: Anthony Bullock           Date of Birth: Dec 01, 1960           MRN: [id number] Visit Date: 12/23/2019              Requested by: [name], MD [place],  [place] [address] PCP: [name], MD  Chief Complaint  Patient presents with  . Left Leg - Routine Post Op    11/13/19 left BKA       HPI:  5 weeks status post left below-knee amputation.  He is doing well.  He does have 1 retained staple.  He is already working with Museum/gallery curator for his new prosthetic Assessment & Plan: Visit Diagnoses: No diagnosis found.  Plan: Continue with shrinker follow-up in 4 weeks.  Sooner if he has any concerns  Follow-Up Instructions: No follow-ups on file.   Ortho Exam  Patient is alert, oriented, no adenopathy, well-dressed, normal affect, normal respiratory effort. Overall amputation stump is healing quite well.  He does have some eschar on the lateral side with there is only a minimal amount of drainage no surrounding cellulitis compartments are all soft.  Otherwise well apposed wound edges.  No evidence of infection  Imaging: No results found. No images are attached to the encounter.  Labs: Lab Results  Component Value Date   HGBA1C 6.5 (H) 11/13/2019   HGBA1C 8.1 (H) 07/10/2019   HGBA1C 8.9 (H) 08/06/2018   ESRSEDRATE 94 (H) 03/30/2016   ESRSEDRATE 18 11/30/2012   CRP 16.1 (H) 03/30/2016   REPTSTATUS 09/08/2019 FINAL 09/03/2019   GRAMSTAIN  03/31/2016    RARE WBC PRESENT, PREDOMINANTLY PMN NO ORGANISMS SEEN    CULT  09/03/2019    NO GROWTH 5 DAYS Performed at [facility], [address] [place], [place] [address]    [place] 03/31/2016   LABORGA ENTEROBACTER SPECIES 03/31/2016     Lab Results  Component Value Date   ALBUMIN 2.8 (L) 11/28/2019   ALBUMIN 2.8 (L) 11/26/2019   ALBUMIN 2.8 (L) 11/26/2019    Lab Results  Component Value Date   MG 2.1 09/04/2019   MG 1.7 08/18/2019    MG 2.1 12/03/2014   No results found for: VD25OH  No results found for: PREALBUMIN CBC EXTENDED Latest Ref Rng & Units 11/28/2019 11/26/2019 11/23/2019  WBC 4.0 - 10.5 K/uL 8.0 7.7 5.9  RBC 4.22 - 5.81 MIL/uL 3.47(L) 3.41(L) 3.57(L)  HGB 13.0 - 17.0 g/dL 10.0(L) 9.7(L) 10.1(L)  HCT 39.0 - 52.0 % 32.1(L) 31.1(L) 32.9(L)  PLT 150 - 400 K/uL 148(L) 134(L) 165  NEUTROABS 1.7 - 7.7 K/uL - 5.1 -  LYMPHSABS 0.7 - 4.0 K/uL - 1.2 -     Body mass index is 38.3 kg/m.  Orders:  No orders of the defined types were placed in this encounter.  No orders of the defined types were placed in this encounter.    Procedures: No procedures performed  Clinical Data: No additional findings.  ROS:  All other systems negative, except as noted in the HPI. Review of Systems  Objective: Vital Signs: Ht _0  (1.448 m)   Wt 177 lb (80.3 kg)   BMI 38.30 kg/m   Specialty Comments:  No specialty comments available.  PMFS History: Patient Active Problem List   Diagnosis Date Noted  . Malnutrition of moderate degree 11/18/2019  . Below-knee amputation of left lower extremity ([place]) 11/16/2019  .  Abscess of left foot 11/13/2019  . Dehiscence of amputation stump ([place])   . History of complete ray amputation of fifth toe of left foot ([place]) 10/09/2019  . Acute pulmonary edema (HCC)   . Osteomyelitis of fifth toe of left foot ([place])   . Febrile illness 08/17/2019  . SIRS (systemic inflammatory response syndrome) ([place]) 08/17/2019  . Thrombocytopenia ([place]) 08/17/2019  . Pressure injury of back, stage 2 ([place]) 08/17/2019  . Hypertensive emergency 07/10/2019  . Onychomycosis 08/16/2016  . Status post unilateral below knee amputation, right ([place]) 04/26/2016  . Bilateral carotid bruits 03/30/2016  . Diabetes mellitus with complication ([place])   . Anemia due to chronic kidney disease   . ESRD on dialysis ([place]) 11/30/2015  . Acute respiratory failure with hypoxia ([place]) 11/29/2015  . At high risk for  falls 09/29/2014  . Peripheral vascular disease ([place]) 09/29/2014  . Osteoarthritis of both knees 01/07/2014  . Osteoarthritis of multiple joints 10/06/2013  . NICM (nonischemic cardiomyopathy) ([place]) 06/27/2013  . CAD (coronary artery disease) 03/13/2013  . Abnormal nuclear stress test 01/27/2013  . Chronic systolic congestive heart failure ([place]) 01/27/2013  . COPD (chronic obstructive pulmonary disease) ([place]) 12/27/2012  . Diabetic peripheral neuropathy ([place]) 02/10/2012  . URINARY CALCULUS 09/28/2009  . Adjustment disorder with depressed mood 08/21/2009  . Poorly controlled type II diabetes mellitus with renal complication ([place]) 75/10/2583  . Hyperlipidemia 04/14/2008  . Essential hypertension 04/14/2008  . ALLERGIC RHINITIS 04/14/2008   Past Medical History:  Diagnosis Date  . 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 Allergic rhinitis   . Anemia   . Arthritis   . Chronic systolic heart failure ([place])    a. ECHO (12/2012) EF 25-30%, HK entireanteroseptal myocardium //  b.  EF 25%, diffuse HK, grade 1 diastolic dysfunction, MAC, mild LAE, normal RVSF, trivial pericardial effusion  . COPD (chronic obstructive pulmonary disease) ([place])   . Diabetes mellitus type II   . Diabetic nephropathy ([place])   . Diabetic neuropathy ([place])   . ESRD on hemodialysis [facility])    started HD June 2017, goes to [facility] HD unit, Dr Hinda Lenis  . History of cardiac catheterization    a.Myoview 1/15:  There is significant left ventricular dysfunction. There may be slight scar at the apex. There is no significant ischemia. LV Ejection Fraction: 27%  //  b. RHC/LHC (1/15) with mean RA 6, PA 47/22 mean 33, mean PCWP 20, PVR 2.5 WU, CI 2.5; 80% dLAD stenosis, 70% diffuse large D.   . History of kidney stones   . Hyperlipidemia   . Hypertension   . Kidney stones   . NICM (nonischemic cardiomyopathy) ([place])    Primarily nonischemic. Echo (12/14) with EF 25-30%. Echo (3/15)  with EF 25%, mild to moderately dilated LV, normal RV size and systolic function.   . Osteomyelitis ([place])    left fifth ray  . Pneumonia   . Urethral stricture   . Wears glasses     Family History  Problem Relation Age of Onset  . Bladder Cancer Mother   . Alcohol abuse Father   . Melanoma Father   . Stroke Maternal Grandmother   . Heart Problems Maternal Grandmother        unknown  . Diabetes Maternal Grandmother   . Heart disease Maternal Grandfather   . Prostate cancer Maternal Grandfather     Past Surgical History:  Procedure Laterality Date  . ABDOMINAL AORTOGRAM W/LOWER EXTREMITY N/A 03/30/2016  Procedure: Abdominal Aortogram w/Lower Extremity;  Surgeon: Angelia Mould, MD;  Location: [place] CV LAB;  Service: Cardiovascular;  Laterality: N/A;  . AMPUTATION Right 04/26/2016   Procedure: Right Below Knee Amputation;  Surgeon: Newt Minion, MD;  Location: [place];  Service: Orthopedics;  Laterality: Right;  . AMPUTATION Left 08/21/2019   Procedure: LEFT FOOT 5TH RAY AMPUTATION;  Surgeon: Newt Minion, MD;  Location: [place];  Service: Orthopedics;  Laterality: Left;  . AMPUTATION Left 11/13/2019   Procedure: LEFT BELOW KNEE AMPUTATION;  Surgeon: Newt Minion, MD;  Location: [place];  Service: Orthopedics;  Laterality: Left;  . AV FISTULA PLACEMENT Right 09/08/2015   Procedure: INSERTION OF 4-17m x 45cm  ARTERIOVENOUS (AV) GORE-TEX GRAFT RIGHT UPPER  ARM;  Surgeon: CAngelia Mould MD;  Location: [facility]  Service: Vascular;  Laterality: Right;  . AV FISTULA PLACEMENT Left 01/14/2016   Procedure: CREATION OF LEFT UPPER ARM ARTERIOVENOUS FISTULA;  Surgeon: CAngelia Mould MD;  [place]  Service: Vascular;  Laterality: Left;  . BASCILIC VEIN TRANSPOSITION Right 08/22/2014   Procedure: RIGHT UPPER ARM BASCILIC VEIN TRANSPOSITION;  Surgeon: CAngelia Mould MD;  [place]  Service: Vascular;  Laterality: Right;  . BELOW KNEE LEG AMPUTATION Right  04/26/2016  . CARDIAC CATHETERIZATION    . CARDIAC DEFIBRILLATOR PLACEMENT  06/27/2013   Sub Q       BY DR KCaryl Comes . CATARACT EXTRACTION W/PHACO Right 08/06/2018   Procedure: CATARACT EXTRACTION PHACO AND INTRAOCULAR LENS PLACEMENT (IOC);  Surgeon: WBaruch Goldmann MD;  [facility];  Service: Ophthalmology;  Laterality: Right;  CDE: 4.06  . CATARACT EXTRACTION W/PHACO Left 08/20/2018   Procedure: CATARACT EXTRACTION PHACO AND INTRAOCULAR LENS PLACEMENT (IOC);  Surgeon: WBaruch Goldmann MD;  [facility]: [facility];  Service: Ophthalmology;  Laterality: Left;  CDE: 6.76  . COLONOSCOPY WITH PROPOFOL N/A 07/22/2015   Procedure: COLONOSCOPY WITH PROPOFOL;  Surgeon: HDoran Stabler MD;  [facility];  Service: Gastroenterology;  Laterality: N/A;  . FEMORAL-POPLITEAL BYPASS GRAFT Right 03/31/2016   Procedure: BYPASS GRAFT FEMORAL-POPLITEAL ARTERY USING RIGHT GREATER SAPHENOUS NONREVERSED VEIN;  Surgeon: CAngelia Mould MD;  Location: [place]  Service: Vascular;  Laterality: Right;  . HERNIA REPAIR    . I & D EXTREMITY Right 03/31/2016   Procedure: IRRIGATION AND DEBRIDEMENT FOOT;  Surgeon: CAngelia Mould MD;  Location: [facility]  Service: Vascular;  Laterality: Right;  . IMPLANTABLE CARDIOVERTER DEFIBRILLATOR IMPLANT N/A 06/27/2013   Procedure: SUB Q ICD;  Surgeon: SDeboraha Sprang MD;  [facility];  Service: Cardiovascular;  Laterality: N/A;  . INTRAOPERATIVE ARTERIOGRAM Right 03/31/2016   Procedure: INTRA OPERATIVE ARTERIOGRAM;  Surgeon: CAngelia Mould MD;  [place] [facility]  Service: Vascular;  Laterality: Right;  . IR GENERIC HISTORICAL Right 11/30/2015   IR THROMBECTOMY AV FISTULA W/THROMBOLYSIS/PTA INC/SHUNT/IMG RIGHT 11/30/2015 GAletta Edouard MD [facility]  . IR GENERIC HISTORICAL  11/30/2015   IR [place] ACCESS RIGHT 11/30/2015 GAletta Edouard MD [facility]  . IR GENERIC HISTORICAL Right 12/15/2015   IR THROMBECTOMY AV FISTULA W/THROMBOLYSIS/PTA/STENT  INC/SHUNT/IMG RT 12/15/2015 DArne Cleveland MD [facility]  . IR GENERIC HISTORICAL  12/15/2015   IR [place] VASC ACCESS RIGHT 12/15/2015 DArne Cleveland MD [facility]  . IR GENERIC HISTORICAL  12/28/2015   IR FLUORO GUIDE CV LINE RIGHT 12/28/2015 AMarybelle Killings MD [facility]  . IR GENERIC HISTORICAL  12/28/2015   IR  US GUIDE VASC ACCESS RIGHT 12/28/2015 Marybelle Killings, MD [facility]  . LEFT A ND RIGHT HEART CATH  01/30/2013   DR Sung Amabile  . LEFT AND RIGHT HEART CATHETERIZATION WITH CORONARY ANGIOGRAM N/A 01/30/2013   Procedure: LEFT AND RIGHT HEART CATHETERIZATION WITH CORONARY ANGIOGRAM;  Surgeon: Jolaine Artist, MD;  Location: [facility] CATH LAB;  Service: Cardiovascular;  Laterality: N/A;  . PERIPHERAL VASCULAR CATHETERIZATION Right 01/26/2015   Procedure: A/V Fistulagram;  Surgeon: Angelia Mould, MD;  Location: [place] CV LAB;  Service: Cardiovascular;  Laterality: Right;  . reapea urethral surgery for recurrent obstruction  2011  . TOTAL KNEE ARTHROPLASTY Right 2007  . VEIN HARVEST Right 03/31/2016   Procedure: RIGHT GREATER SAPHENOUS VEIN HARVEST;  Surgeon: Angelia Mould, MD;  Location: [place];  Service: Vascular;  Laterality: Right;   Social History   Occupational History  . Not on file  Tobacco Use  . Smoking status: Former Smoker    Packs/day: 2.00    Years: 32.00    Pack years: 64.00    Types: Cigarettes    Quit date: 05/11/2010    Years since quitting: 9.6  . Smokeless tobacco: Never Used  Vaping Use  . Vaping Use: Never used  Substance and Sexual Activity  . Alcohol use: No  . Drug use: No  . Sexual activity: Yes

## 2019-12-24 DIAGNOSIS — Z992 Dependence on renal dialysis: Secondary | ICD-10-CM | POA: Diagnosis not present

## 2019-12-24 DIAGNOSIS — D509 Iron deficiency anemia, unspecified: Secondary | ICD-10-CM | POA: Diagnosis not present

## 2019-12-24 DIAGNOSIS — D631 Anemia in chronic kidney disease: Secondary | ICD-10-CM | POA: Diagnosis not present

## 2019-12-24 DIAGNOSIS — N2581 Secondary hyperparathyroidism of renal origin: Secondary | ICD-10-CM | POA: Diagnosis not present

## 2019-12-24 DIAGNOSIS — N186 End stage renal disease: Secondary | ICD-10-CM | POA: Diagnosis not present

## 2019-12-24 DIAGNOSIS — E209 Hypoparathyroidism, unspecified: Secondary | ICD-10-CM | POA: Diagnosis not present

## 2019-12-25 DIAGNOSIS — E1122 Type 2 diabetes mellitus with diabetic chronic kidney disease: Secondary | ICD-10-CM | POA: Diagnosis not present

## 2019-12-25 DIAGNOSIS — D631 Anemia in chronic kidney disease: Secondary | ICD-10-CM | POA: Diagnosis not present

## 2019-12-25 DIAGNOSIS — I5022 Chronic systolic (congestive) heart failure: Secondary | ICD-10-CM | POA: Diagnosis not present

## 2019-12-25 DIAGNOSIS — N186 End stage renal disease: Secondary | ICD-10-CM | POA: Diagnosis not present

## 2019-12-25 DIAGNOSIS — Z4781 Encounter for orthopedic aftercare following surgical amputation: Secondary | ICD-10-CM | POA: Diagnosis not present

## 2019-12-25 DIAGNOSIS — I132 Hypertensive heart and chronic kidney disease with heart failure and with stage 5 chronic kidney disease, or end stage renal disease: Secondary | ICD-10-CM | POA: Diagnosis not present

## 2019-12-26 DIAGNOSIS — D509 Iron deficiency anemia, unspecified: Secondary | ICD-10-CM | POA: Diagnosis not present

## 2019-12-26 DIAGNOSIS — N2581 Secondary hyperparathyroidism of renal origin: Secondary | ICD-10-CM | POA: Diagnosis not present

## 2019-12-26 DIAGNOSIS — D631 Anemia in chronic kidney disease: Secondary | ICD-10-CM | POA: Diagnosis not present

## 2019-12-26 DIAGNOSIS — E209 Hypoparathyroidism, unspecified: Secondary | ICD-10-CM | POA: Diagnosis not present

## 2019-12-26 DIAGNOSIS — Z992 Dependence on renal dialysis: Secondary | ICD-10-CM | POA: Diagnosis not present

## 2019-12-26 DIAGNOSIS — N186 End stage renal disease: Secondary | ICD-10-CM | POA: Diagnosis not present

## 2019-12-27 DIAGNOSIS — Z4781 Encounter for orthopedic aftercare following surgical amputation: Secondary | ICD-10-CM | POA: Diagnosis not present

## 2019-12-27 DIAGNOSIS — E1122 Type 2 diabetes mellitus with diabetic chronic kidney disease: Secondary | ICD-10-CM | POA: Diagnosis not present

## 2019-12-27 DIAGNOSIS — N186 End stage renal disease: Secondary | ICD-10-CM | POA: Diagnosis not present

## 2019-12-27 DIAGNOSIS — I132 Hypertensive heart and chronic kidney disease with heart failure and with stage 5 chronic kidney disease, or end stage renal disease: Secondary | ICD-10-CM | POA: Diagnosis not present

## 2019-12-27 DIAGNOSIS — I5022 Chronic systolic (congestive) heart failure: Secondary | ICD-10-CM | POA: Diagnosis not present

## 2019-12-27 DIAGNOSIS — D631 Anemia in chronic kidney disease: Secondary | ICD-10-CM | POA: Diagnosis not present

## 2019-12-28 DIAGNOSIS — D509 Iron deficiency anemia, unspecified: Secondary | ICD-10-CM | POA: Diagnosis not present

## 2019-12-28 DIAGNOSIS — N2581 Secondary hyperparathyroidism of renal origin: Secondary | ICD-10-CM | POA: Diagnosis not present

## 2019-12-28 DIAGNOSIS — Z992 Dependence on renal dialysis: Secondary | ICD-10-CM | POA: Diagnosis not present

## 2019-12-28 DIAGNOSIS — E209 Hypoparathyroidism, unspecified: Secondary | ICD-10-CM | POA: Diagnosis not present

## 2019-12-28 DIAGNOSIS — D631 Anemia in chronic kidney disease: Secondary | ICD-10-CM | POA: Diagnosis not present

## 2019-12-28 DIAGNOSIS — N186 End stage renal disease: Secondary | ICD-10-CM | POA: Diagnosis not present

## 2019-12-30 ENCOUNTER — Telehealth: Payer: Self-pay | Admitting: Orthopedic Surgery

## 2019-12-30 DIAGNOSIS — E1140 Type 2 diabetes mellitus with diabetic neuropathy, unspecified: Secondary | ICD-10-CM | POA: Diagnosis not present

## 2019-12-30 DIAGNOSIS — M1990 Unspecified osteoarthritis, unspecified site: Secondary | ICD-10-CM | POA: Diagnosis not present

## 2019-12-30 DIAGNOSIS — E785 Hyperlipidemia, unspecified: Secondary | ICD-10-CM | POA: Diagnosis not present

## 2019-12-30 DIAGNOSIS — Z794 Long term (current) use of insulin: Secondary | ICD-10-CM | POA: Diagnosis not present

## 2019-12-30 DIAGNOSIS — I132 Hypertensive heart and chronic kidney disease with heart failure and with stage 5 chronic kidney disease, or end stage renal disease: Secondary | ICD-10-CM | POA: Diagnosis not present

## 2019-12-30 DIAGNOSIS — J449 Chronic obstructive pulmonary disease, unspecified: Secondary | ICD-10-CM | POA: Diagnosis not present

## 2019-12-30 DIAGNOSIS — E11649 Type 2 diabetes mellitus with hypoglycemia without coma: Secondary | ICD-10-CM | POA: Diagnosis not present

## 2019-12-30 DIAGNOSIS — D631 Anemia in chronic kidney disease: Secondary | ICD-10-CM | POA: Diagnosis not present

## 2019-12-30 DIAGNOSIS — Z8701 Personal history of pneumonia (recurrent): Secondary | ICD-10-CM | POA: Diagnosis not present

## 2019-12-30 DIAGNOSIS — Z89511 Acquired absence of right leg below knee: Secondary | ICD-10-CM | POA: Diagnosis not present

## 2019-12-30 DIAGNOSIS — E1165 Type 2 diabetes mellitus with hyperglycemia: Secondary | ICD-10-CM | POA: Diagnosis not present

## 2019-12-30 DIAGNOSIS — Z4781 Encounter for orthopedic aftercare following surgical amputation: Secondary | ICD-10-CM | POA: Diagnosis not present

## 2019-12-30 DIAGNOSIS — Z993 Dependence on wheelchair: Secondary | ICD-10-CM | POA: Diagnosis not present

## 2019-12-30 DIAGNOSIS — I5022 Chronic systolic (congestive) heart failure: Secondary | ICD-10-CM | POA: Diagnosis not present

## 2019-12-30 DIAGNOSIS — Z95810 Presence of automatic (implantable) cardiac defibrillator: Secondary | ICD-10-CM | POA: Diagnosis not present

## 2019-12-30 DIAGNOSIS — Z992 Dependence on renal dialysis: Secondary | ICD-10-CM | POA: Diagnosis not present

## 2019-12-30 DIAGNOSIS — N186 End stage renal disease: Secondary | ICD-10-CM | POA: Diagnosis not present

## 2019-12-30 DIAGNOSIS — E1122 Type 2 diabetes mellitus with diabetic chronic kidney disease: Secondary | ICD-10-CM | POA: Diagnosis not present

## 2019-12-30 DIAGNOSIS — Z7982 Long term (current) use of aspirin: Secondary | ICD-10-CM | POA: Diagnosis not present

## 2019-12-30 DIAGNOSIS — Z87891 Personal history of nicotine dependence: Secondary | ICD-10-CM | POA: Diagnosis not present

## 2019-12-30 DIAGNOSIS — Z89512 Acquired absence of left leg below knee: Secondary | ICD-10-CM | POA: Diagnosis not present

## 2019-12-30 DIAGNOSIS — J309 Allergic rhinitis, unspecified: Secondary | ICD-10-CM | POA: Diagnosis not present

## 2019-12-30 NOTE — Telephone Encounter (Signed)
[place] form received. Sent to [place].

## 2019-12-31 DIAGNOSIS — Z992 Dependence on renal dialysis: Secondary | ICD-10-CM | POA: Diagnosis not present

## 2019-12-31 DIAGNOSIS — E209 Hypoparathyroidism, unspecified: Secondary | ICD-10-CM | POA: Diagnosis not present

## 2019-12-31 DIAGNOSIS — D509 Iron deficiency anemia, unspecified: Secondary | ICD-10-CM | POA: Diagnosis not present

## 2019-12-31 DIAGNOSIS — D631 Anemia in chronic kidney disease: Secondary | ICD-10-CM | POA: Diagnosis not present

## 2019-12-31 DIAGNOSIS — N2581 Secondary hyperparathyroidism of renal origin: Secondary | ICD-10-CM | POA: Diagnosis not present

## 2019-12-31 DIAGNOSIS — N186 End stage renal disease: Secondary | ICD-10-CM | POA: Diagnosis not present

## 2020-01-01 ENCOUNTER — Encounter
Payer: Medicare Other | Attending: Physical Medicine and Rehabilitation | Admitting: Physical Medicine and Rehabilitation

## 2020-01-01 ENCOUNTER — Other Ambulatory Visit: Payer: Self-pay

## 2020-01-01 ENCOUNTER — Encounter: Payer: Self-pay | Admitting: Physical Medicine and Rehabilitation

## 2020-01-01 VITALS — BP 122/69 | HR 71 | Temp 98.4°F | Ht 70.0 in | Wt 178.0 lb

## 2020-01-01 DIAGNOSIS — S88112A Complete traumatic amputation at level between knee and ankle, left lower leg, initial encounter: Secondary | ICD-10-CM

## 2020-01-01 DIAGNOSIS — Z9181 History of falling: Secondary | ICD-10-CM | POA: Diagnosis not present

## 2020-01-01 DIAGNOSIS — T8781 Dehiscence of amputation stump: Secondary | ICD-10-CM | POA: Insufficient documentation

## 2020-01-01 NOTE — Patient Instructions (Addendum)
Pt is a 59 yr old male with L BKA due to nonhealing ulcer, also has B/L buttocks and sacral stage II ulcers, sent home on Keflex for cellulitis on L BKA, ESRD on HD, DM2, HTM, hyperkalemia, here for hospital f/u.  Needs to avoid L hip and knee contracture so can get prosthesis.    1. L BKA- Continue current wound care.   2. Con't doing home exercise program.  Don't get L hip or knee contracture.   3. Lay on abdomen  2x/day- stretches hips and knees out- for at least 10 minutes 2x/day- can push up to stretch more. .   4. Doesn't need pain meds  5. Scar mobilization once healed over and then try Vitamin E to help with improving scar tissue healing. Less expensive.   6. Can also use Eucerin or Vaseline for dry skin of L BKA.    7. Went over increased energy expenditure required to walk with  BKA- 40% more; AKA 100% more and add them up as required.  So need to avoid an AKA if at all possible.  8. Will order more therapy- PT once gets prosthesis- just looking at L BKA, will likely be 6-8 weeks. Has appointment with [facility] on January 24th.   9. F/U in 2 months- call me earlier if needs PT sooner than this.   10. [facility] can help with either car modification or pointing in the right direction- [phone]-

## 2020-01-01 NOTE — Progress Notes (Signed)
Subjective:    Patient ID: Anthony Bullock, male    DOB: [birth year], 59 y.o.   MRN: [id number]  HPI  Pt is a 59 yr old male with L BKA due to nonhealing ulcer, also has B/L buttocks and sacral stage II ulcers, sent home on Keflex for cellulitis on L BKA, ESRD on HD, DM2, HTM, hyperkalemia, here for hospital f/u.   Not really taking tylenol- pain pretty much controlled/gone. Not even tylenol regularly.   Got retained staples removed last Monday and has a hiccup as a result.   Puts polysporin to stop shrinker and dressing to not stick.  Hasn't had any redness, no heat of L BKA, but had a little bleeding/pus type drainage- last yesterday.    Standing with PT, but not walking, due to R BKA- and has R prosthesis.  Finished H/H last Friday.    Buttocks are healed over and sacrum- nothing open anymore. Uses dermacloud every day to keep it healed.        Pain Inventory Average Pain 0 Pain Right Now 0 My pain is no pain  In the last 24 hours, has pain interfered with the following? General activity 0 Relation with others 0 Enjoyment of life 0 What TIME of day is your pain at its worst? No pain Sleep (in general) Good  Pain is worse with: no pain Pain improves with: no pain Relief from Meds: no pain  how many minutes can you walk? none ability to climb steps?  no do you drive?  no use a wheelchair  disabled: date disabled . I need assistance with the following:  dressing, bathing, toileting, meal prep and shopping  No problems in this area  hospital f/u  hospital f/u    Family History  Problem Relation Age of Onset  . Bladder Cancer Mother   . Alcohol abuse Father   . Melanoma Father   . Stroke Maternal Grandmother   . Heart Problems Maternal Grandmother        unknown  . Diabetes Maternal Grandmother   . Heart disease Maternal Grandfather   . Prostate cancer Maternal Grandfather    Social History   Socioeconomic History  . Marital status: Married     Spouse name: Not on file  . Number of children: 0  . Years of education: Not on file  . Highest education level: Not on file  Occupational History  . Not on file  Tobacco Use  . Smoking status: Former Smoker    Packs/day: 2.00    Years: 32.00    Pack years: 64.00    Types: Cigarettes    Quit date: 05/11/2010    Years since quitting: 9.6  . Smokeless tobacco: Never Used  Vaping Use  . Vaping Use: Never used  Substance and Sexual Activity  . Alcohol use: No  . Drug use: No  . Sexual activity: Yes  Other Topics Concern  . 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 ABDOMINAL AORTOGRAM W/LOWER EXTREMITY N/A 03/30/2016   Procedure: Abdominal Aortogram w/Lower Extremity;  Surgeon: Angelia Mould, MD;  Location: [facility];  Service:  Cardiovascular;  Laterality: N/A;  . AMPUTATION Right 04/26/2016   Procedure: Right Below Knee Amputation;  Surgeon: Newt Minion, MD;  Location: [place];  Service: Orthopedics;  Laterality: Right;  . AMPUTATION Left 08/21/2019   Procedure: LEFT FOOT 5TH RAY AMPUTATION;  Surgeon: Newt Minion, MD;  Location: [place];  Service: Orthopedics;  Laterality: Left;  . AMPUTATION Left 11/13/2019   Procedure: LEFT BELOW KNEE AMPUTATION;  Surgeon: Newt Minion, MD;  Location: [place];  Service: Orthopedics;  Laterality: Left;  . AV FISTULA PLACEMENT Right 09/08/2015   Procedure: INSERTION OF 4-25m x 45cm  ARTERIOVENOUS (AV) GORE-TEX GRAFT RIGHT UPPER  ARM;  Surgeon: CAngelia Mould MD;  [place]  Service: Vascular;  Laterality: Right;  . AV FISTULA PLACEMENT Left 01/14/2016   Procedure: CREATION OF LEFT UPPER ARM ARTERIOVENOUS FISTULA;   Surgeon: CAngelia Mould MD;  [place]  Service: Vascular;  Laterality: Left;  . BASCILIC VEIN TRANSPOSITION Right 08/22/2014   Procedure: RIGHT UPPER ARM BASCILIC VEIN TRANSPOSITION;  Surgeon: CAngelia Mould MD;  [place]  Service: Vascular;  Laterality: Right;  . BELOW KNEE LEG AMPUTATION Right 04/26/2016  . CARDIAC CATHETERIZATION    . CARDIAC DEFIBRILLATOR PLACEMENT  06/27/2013   Sub Q       BY DR KCaryl Comes . CATARACT EXTRACTION W/PHACO Right 08/06/2018   Procedure: CATARACT EXTRACTION PHACO AND INTRAOCULAR LENS PLACEMENT (IOC);  Surgeon: WBaruch Goldmann MD;  [facility];  Service: Ophthalmology;  Laterality: Right;  CDE: 4.06  . CATARACT EXTRACTION W/PHACO Left 08/20/2018   Procedure: CATARACT EXTRACTION PHACO AND INTRAOCULAR LENS PLACEMENT (IOC);  Surgeon: WBaruch Goldmann MD;  [facility]: [facility];  Service: Ophthalmology;  Laterality: Left;  CDE: 6.76  . COLONOSCOPY WITH PROPOFOL N/A 07/22/2015   Procedure: COLONOSCOPY WITH PROPOFOL;  Surgeon: HDoran Stabler MD;  [facility];  Service: Gastroenterology;  Laterality: N/A;  . FEMORAL-POPLITEAL BYPASS GRAFT Right 03/31/2016   Procedure: BYPASS GRAFT FEMORAL-POPLITEAL ARTERY USING RIGHT GREATER SAPHENOUS NONREVERSED VEIN;  Surgeon: CAngelia Mould MD;  [place]  Service: Vascular;  Laterality: Right;  . HERNIA REPAIR    . I & D EXTREMITY Right 03/31/2016   Procedure: IRRIGATION AND DEBRIDEMENT FOOT;  Surgeon: CAngelia Mould MD;  [place]  Service: Vascular;  Laterality: Right;  . IMPLANTABLE CARDIOVERTER DEFIBRILLATOR IMPLANT N/A 06/27/2013   Procedure: SUB Q ICD;  Surgeon: SDeboraha Sprang MD;  [facility];  Service: Cardiovascular;  Laterality: N/A;  . INTRAOPERATIVE ARTERIOGRAM Right 03/31/2016   Procedure: INTRA OPERATIVE ARTERIOGRAM;  Surgeon: CAngelia Mould MD;  [place] [facility]  Service: Vascular;  Laterality: Right;  . IR GENERIC HISTORICAL Right  11/30/2015   IR THROMBECTOMY AV FISTULA W/THROMBOLYSIS/PTA INC/SHUNT/IMG RIGHT 11/30/2015 GAletta Edouard MD [facility]  . IR GENERIC HISTORICAL  11/30/2015   IR [place] ACCESS RIGHT 11/30/2015 GAletta Edouard MD [facility]  . IR GENERIC HISTORICAL Right 12/15/2015   IR THROMBECTOMY AV FISTULA W/THROMBOLYSIS/PTA/STENT INC/SHUNT/IMG RT 12/15/2015 DArne Cleveland MD [facility]  . IR GENERIC HISTORICAL  12/15/2015   IR [place] VASC ACCESS RIGHT 12/15/2015 DArne Cleveland MD [facility]  . IR GENERIC HISTORICAL  12/28/2015   IR FLUORO GUIDE CV LINE RIGHT 12/28/2015 AMarybelle Killings MD [facility]  . IR GENERIC HISTORICAL  12/28/2015   IR [place] VASC ACCESS RIGHT 12/28/2015 AMarybelle Killings MD [facility]  . LEFT A ND RIGHT HEART CATH  01/30/2013   DR Sung Amabile  . LEFT AND RIGHT HEART CATHETERIZATION WITH CORONARY ANGIOGRAM N/A 01/30/2013   Procedure: LEFT AND RIGHT HEART CATHETERIZATION WITH CORONARY ANGIOGRAM;  Surgeon: Jolaine Artist, MD;  Location: [facility];  Service: Cardiovascular;  Laterality: N/A;  . PERIPHERAL VASCULAR CATHETERIZATION Right 01/26/2015   Procedure: A/V Fistulagram;  Surgeon: Angelia Mould, MD;  Location: [place] [facility];  Service: Cardiovascular;  Laterality: Right;  . reapea urethral surgery for recurrent obstruction  2011  . TOTAL KNEE ARTHROPLASTY Right 2007  . VEIN HARVEST Right 03/31/2016   Procedure: RIGHT GREATER SAPHENOUS VEIN HARVEST;  Surgeon: Angelia Mould, MD;  Location: [place];  Service: Vascular;  Laterality: Right;   Past Medical History:  Diagnosis Date  . 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 Allergic rhinitis   . Anemia   . Arthritis   . Chronic systolic heart failure ([place])    a. ECHO (12/2012) EF 25-30%, HK entireanteroseptal myocardium //  b.  EF 25%, diffuse HK, grade 1 diastolic dysfunction, MAC, mild LAE, normal RVSF, trivial pericardial effusion  . COPD (chronic  obstructive pulmonary disease) ([place])   . Diabetes mellitus type II   . Diabetic nephropathy ([place])   . Diabetic neuropathy ([place])   . ESRD on hemodialysis [facility])    started HD June 2017, goes to [facility] HD unit, Dr Hinda Lenis  . History of cardiac catheterization    a.Myoview 1/15:  There is significant left ventricular dysfunction. There may be slight scar at the apex. There is no significant ischemia. LV Ejection Fraction: 27%  //  b. RHC/LHC (1/15) with mean RA 6, PA 47/22 mean 33, mean PCWP 20, PVR 2.5 WU, CI 2.5; 80% dLAD stenosis, 70% diffuse large D.   . History of kidney stones   . Hyperlipidemia   . Hypertension   . Kidney stones   . NICM (nonischemic cardiomyopathy) ([place])    Primarily nonischemic. Echo (12/14) with EF 25-30%. Echo (3/15) with EF 25%, mild to moderately dilated LV, normal RV size and systolic function.   . Osteomyelitis ([place])    left fifth ray  . Pneumonia   . Urethral stricture   . Wears glasses    BP 122/69   Pulse 71   Temp 98.4 F (36.9 C)   Ht _0  (1.778 m)   Wt 178 lb (80.7 kg)   SpO2 98%   BMI 25.54 kg/m   Opioid Risk Score:   Fall Risk Score:  `1  Depression screen PHQ 2/9  Depression screen [facility] 2/9 01/01/2020 05/08/2019 11/15/2017  Decreased Interest 0 0 0  Down, Depressed, Hopeless 0 0 0  PHQ - 2 Score 0 0 0  Altered sleeping 0 - -  Tired, decreased energy 0 - -  Change in appetite 0 - -  Feeling bad or failure about yourself  0 - -  Trouble concentrating 0 - -  Moving slowly or fidgety/restless 0 - -  Suicidal thoughts 0 - -  PHQ-9 Score 0 - -  Some recent data might be hidden    Review of Systems  Constitutional: Negative.   HENT: Negative.   Eyes: Negative.   Respiratory: Negative.   Cardiovascular: Negative.   Gastrointestinal: Negative.   Endocrine: Negative.   Genitourinary: Negative.   Musculoskeletal: Positive for gait problem.  Skin: Negative.   Allergic/Immunologic: Negative.   Hematological: Negative.    Psychiatric/Behavioral: Negative.   All other systems reviewed and are  negative.      Objective:   Physical Exam  Awake, alert, appropriate, in manual w/c- accompanied by wife, using limb protector and shrinker, NAD L lateral aspect- has a large scab that's cracked open a little- no actual drainage/dressing pieces of scabs on it- dry skin around L BKA healing incision.            Assessment & Plan:   Pt is a 59 yr old male with L BKA due to nonhealing ulcer, also has B/L buttocks and sacral stage II ulcers, sent home on Keflex for cellulitis on L BKA, ESRD on HD, DM2, HTM, hyperkalemia, here for hospital f/u.  Needs to avoid L hip and knee contracture so can get prosthesis.    1. L BKA- Continue current wound care.   2. Con't doing home exercise program.  Don't get L hip or knee contracture.   3. Lay on abdomen  2x/day- stretches hips and knees out- for at least 10 minutes 2x/day- can push up to stretch more. .   4. Doesn't need pain meds  5. Scar mobilization once healed over and then try Vitamin E to help with improving scar tissue healing. Less expensive.   6. Can also use Eucerin or Vaseline for dry skin of L BKA.    7. Went over increased energy expenditure required to walk with  BKA- 40% more; AKA 100% more and add them up as required.  So need to avoid an AKA if at all possible.  8. Will order more therapy- PT once gets prosthesis- just looking at L BKA, will likely be 6-8 weeks. Has appointment with [facility] on January 24th.   9. F/U in 2 months- call me if needs PT earlier than this.   10. [facility] can help with either car modification or pointing in the right direction- [phone]-  I spent a total of 30 minutes on visit- as detailed above.

## 2020-01-02 DIAGNOSIS — E209 Hypoparathyroidism, unspecified: Secondary | ICD-10-CM | POA: Diagnosis not present

## 2020-01-02 DIAGNOSIS — N186 End stage renal disease: Secondary | ICD-10-CM | POA: Diagnosis not present

## 2020-01-02 DIAGNOSIS — D631 Anemia in chronic kidney disease: Secondary | ICD-10-CM | POA: Diagnosis not present

## 2020-01-02 DIAGNOSIS — N2581 Secondary hyperparathyroidism of renal origin: Secondary | ICD-10-CM | POA: Diagnosis not present

## 2020-01-02 DIAGNOSIS — Z992 Dependence on renal dialysis: Secondary | ICD-10-CM | POA: Diagnosis not present

## 2020-01-02 DIAGNOSIS — D509 Iron deficiency anemia, unspecified: Secondary | ICD-10-CM | POA: Diagnosis not present

## 2020-01-05 DIAGNOSIS — E209 Hypoparathyroidism, unspecified: Secondary | ICD-10-CM | POA: Diagnosis not present

## 2020-01-05 DIAGNOSIS — D509 Iron deficiency anemia, unspecified: Secondary | ICD-10-CM | POA: Diagnosis not present

## 2020-01-05 DIAGNOSIS — N186 End stage renal disease: Secondary | ICD-10-CM | POA: Diagnosis not present

## 2020-01-05 DIAGNOSIS — D631 Anemia in chronic kidney disease: Secondary | ICD-10-CM | POA: Diagnosis not present

## 2020-01-05 DIAGNOSIS — Z992 Dependence on renal dialysis: Secondary | ICD-10-CM | POA: Diagnosis not present

## 2020-01-05 DIAGNOSIS — N2581 Secondary hyperparathyroidism of renal origin: Secondary | ICD-10-CM | POA: Diagnosis not present

## 2020-01-07 ENCOUNTER — Ambulatory Visit (INDEPENDENT_AMBULATORY_CARE_PROVIDER_SITE_OTHER): Payer: Medicare Other | Admitting: Physician Assistant

## 2020-01-07 ENCOUNTER — Encounter: Payer: Self-pay | Admitting: Physician Assistant

## 2020-01-07 ENCOUNTER — Telehealth: Payer: Self-pay

## 2020-01-07 VITALS — Ht 70.0 in | Wt 178.0 lb

## 2020-01-07 DIAGNOSIS — Z992 Dependence on renal dialysis: Secondary | ICD-10-CM | POA: Diagnosis not present

## 2020-01-07 DIAGNOSIS — D509 Iron deficiency anemia, unspecified: Secondary | ICD-10-CM | POA: Diagnosis not present

## 2020-01-07 DIAGNOSIS — N186 End stage renal disease: Secondary | ICD-10-CM | POA: Diagnosis not present

## 2020-01-07 DIAGNOSIS — Z794 Long term (current) use of insulin: Secondary | ICD-10-CM | POA: Diagnosis not present

## 2020-01-07 DIAGNOSIS — I96 Gangrene, not elsewhere classified: Secondary | ICD-10-CM

## 2020-01-07 DIAGNOSIS — N2581 Secondary hyperparathyroidism of renal origin: Secondary | ICD-10-CM | POA: Diagnosis not present

## 2020-01-07 DIAGNOSIS — E119 Type 2 diabetes mellitus without complications: Secondary | ICD-10-CM | POA: Diagnosis not present

## 2020-01-07 DIAGNOSIS — D631 Anemia in chronic kidney disease: Secondary | ICD-10-CM | POA: Diagnosis not present

## 2020-01-07 DIAGNOSIS — E209 Hypoparathyroidism, unspecified: Secondary | ICD-10-CM | POA: Diagnosis not present

## 2020-01-07 MED ORDER — DOXYCYCLINE HYCLATE 100 MG PO TABS
100.0000 mg | ORAL_TABLET | Freq: Two times a day (BID) | ORAL | 0 refills | Status: DC
Start: 2020-01-07 — End: 2020-02-14

## 2020-01-07 NOTE — Telephone Encounter (Signed)
Called pt and he will come in today at 1pm

## 2020-01-07 NOTE — Progress Notes (Signed)
Office Visit Note   Patient: Anthony Bullock           Date of Birth: [birth year]           MRN: [id number] Visit Date: 01/07/2020              Requested by: [name], MD [place],  [place] [address] PCP: [name], MD  Chief Complaint  Patient presents with  . Left Leg - Routine Post Op    11/13/19 left BKA       HPI: This is a pleasant 59 year old gentleman who is 6 weeks status post left below-knee amputation.  Overall he has been doing well.  However on the lateral side of his wound he did have one area of dehiscence with some drainage.  His wife was concerned because he also felt like the stump was a bit more warm and he seemed to have more pain this morning he denies any fever or chills and he states his blood sugars have been normalized  Assessment & Plan: Visit Diagnoses: No diagnosis found.  Plan: Continue daily water and Dial soap cleansing.  I would like for the the stump shrinker applied directly to the skin.  I would like for him to continue to work on his swelling.  We will see him back in a week to recheck or sooner.  I do not see any findings consistent today with acute infection but will have him take a course of doxycycline  Follow-Up Instructions: No follow-ups on file.   Ortho Exam  Patient is alert, oriented, no adenopathy, well-dressed, normal affect, normal respiratory effort. Overall well-healed incision.  He does have a couple areas of eschar.  The most notable is on the lateral side.  There is a Vicryl suture that was debrided out.  There is no foul odor no surrounding cellulitis most of his pain is on the anterior tibia but again no cellulitis no drainage  Imaging: No results found. No images are attached to the encounter.  Labs: Lab Results  Component Value Date   HGBA1C 6.5 (H) 11/13/2019   HGBA1C 8.1 (H) 07/10/2019   HGBA1C 8.9 (H) 08/06/2018   ESRSEDRATE 94 (H) 03/30/2016   ESRSEDRATE 18 11/30/2012   CRP  16.1 (H) 03/30/2016   REPTSTATUS 09/08/2019 FINAL 09/03/2019   GRAMSTAIN  03/31/2016    RARE WBC PRESENT, PREDOMINANTLY PMN NO ORGANISMS SEEN    CULT  09/03/2019    NO GROWTH 5 DAYS Performed at [facility], [address] [place], [place] [address]    [place] 03/31/2016   LABORGA ENTEROBACTER SPECIES 03/31/2016     Lab Results  Component Value Date   ALBUMIN 2.8 (L) 11/28/2019   ALBUMIN 2.8 (L) 11/26/2019   ALBUMIN 2.8 (L) 11/26/2019    Lab Results  Component Value Date   MG 2.1 09/04/2019   MG 1.7 08/18/2019   MG 2.1 12/03/2014   No results found for: VD25OH  No results found for: PREALBUMIN CBC EXTENDED Latest Ref Rng & Units 11/28/2019 11/26/2019 11/23/2019  WBC 4.0 - 10.5 K/uL 8.0 7.7 5.9  RBC 4.22 - 5.81 MIL/uL 3.47(L) 3.41(L) 3.57(L)  HGB 13.0 - 17.0 g/dL 10.0(L) 9.7(L) 10.1(L)  HCT 39.0 - 52.0 % 32.1(L) 31.1(L) 32.9(L)  PLT 150 - 400 K/uL 148(L) 134(L) 165  NEUTROABS 1.7 - 7.7 K/uL - 5.1 -  LYMPHSABS 0.7 - 4.0 K/uL - 1.2 -     Body mass index is  25.54 kg/m.  Orders:  No orders of the defined types were placed in this encounter.  Meds ordered this encounter  Medications  . doxycycline (VIBRA-TABS) 100 MG tablet    Sig: Take 1 tablet (100 mg total) by mouth 2 (two) times daily.    Dispense:  60 tablet    Refill:  0     Procedures: No procedures performed  Clinical Data: No additional findings.  ROS:  All other systems negative, except as noted in the HPI. Review of Systems  Objective: Vital Signs: Ht _0  (1.778 m)   Wt 178 lb (80.7 kg)   BMI 25.54 kg/m   Specialty Comments:  No specialty comments available.  PMFS History: Patient Active Problem List   Diagnosis Date Noted  . Malnutrition of moderate degree 11/18/2019  . Below-knee amputation of left lower extremity ([place]) 11/16/2019  . Abscess of left foot 11/13/2019  . Dehiscence of amputation stump ([place])   . History of complete ray amputation of  fifth toe of left foot ([place]) 10/09/2019  . Acute pulmonary edema (HCC)   . Osteomyelitis of fifth toe of left foot ([place])   . Febrile illness 08/17/2019  . SIRS (systemic inflammatory response syndrome) ([place]) 08/17/2019  . Thrombocytopenia ([place]) 08/17/2019  . Pressure injury of back, stage 2 ([place]) 08/17/2019  . Hypertensive emergency 07/10/2019  . Onychomycosis 08/16/2016  . Status post unilateral below knee amputation, right ([place]) 04/26/2016  . Bilateral carotid bruits 03/30/2016  . Diabetes mellitus with complication ([place])   . Anemia due to chronic kidney disease   . ESRD on dialysis ([place]) 11/30/2015  . Acute respiratory failure with hypoxia ([place]) 11/29/2015  . At high risk for falls 09/29/2014  . Peripheral vascular disease ([place]) 09/29/2014  . Osteoarthritis of both knees 01/07/2014  . Osteoarthritis of multiple joints 10/06/2013  . NICM (nonischemic cardiomyopathy) ([place]) 06/27/2013  . CAD (coronary artery disease) 03/13/2013  . Abnormal nuclear stress test 01/27/2013  . Chronic systolic congestive heart failure ([place]) 01/27/2013  . COPD (chronic obstructive pulmonary disease) ([place]) 12/27/2012  . Diabetic peripheral neuropathy ([place]) 02/10/2012  . URINARY CALCULUS 09/28/2009  . Adjustment disorder with depressed mood 08/21/2009  . Poorly controlled type II diabetes mellitus with renal complication ([place]) 56/43/3295  . Hyperlipidemia 04/14/2008  . Essential hypertension 04/14/2008  . ALLERGIC RHINITIS 04/14/2008   Past Medical History:  Diagnosis Date  . 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 Allergic rhinitis   . Anemia   . Arthritis   . Chronic systolic heart failure ([place])    a. ECHO (12/2012) EF 25-30%, HK entireanteroseptal myocardium //  b.  EF 25%, diffuse HK, grade 1 diastolic dysfunction, MAC, mild LAE, normal RVSF, trivial pericardial effusion  . COPD (chronic obstructive pulmonary disease) ([place])   . Diabetes mellitus type II   .  Diabetic nephropathy ([place])   . Diabetic neuropathy ([place])   . ESRD on hemodialysis [facility])    started HD June 2017, goes to [facility], Dr Hinda Lenis  . History of cardiac catheterization    a.Myoview 1/15:  There is significant left ventricular dysfunction. There may be slight scar at the apex. There is no significant ischemia. LV Ejection Fraction: 27%  //  b. RHC/LHC (1/15) with mean RA 6, PA 47/22 mean 33, mean PCWP 20, PVR 2.5 WU, CI 2.5; 80% dLAD stenosis, 70% diffuse large D.   . History of kidney stones   . Hyperlipidemia   . Hypertension   .  Kidney stones   . NICM (nonischemic cardiomyopathy) ([place])    Primarily nonischemic. Echo (12/14) with EF 25-30%. Echo (3/15) with EF 25%, mild to moderately dilated LV, normal RV size and systolic function.   . Osteomyelitis ([place])    left fifth ray  . Pneumonia   . Urethral stricture   . Wears glasses     Family History  Problem Relation Age of Onset  . Bladder Cancer Mother   . Alcohol abuse Father   . Melanoma Father   . Stroke Maternal Grandmother   . Heart Problems Maternal Grandmother        unknown  . Diabetes Maternal Grandmother   . Heart disease Maternal Grandfather   . Prostate cancer Maternal Grandfather     Past Surgical History:  Procedure Laterality Date  . ABDOMINAL AORTOGRAM W/LOWER EXTREMITY N/A 03/30/2016   Procedure: Abdominal Aortogram w/Lower Extremity;  Surgeon: Angelia Mould, MD;  Location: [place] [facility];  Service: Cardiovascular;  Laterality: N/A;  . AMPUTATION Right 04/26/2016   Procedure: Right Below Knee Amputation;  Surgeon: Newt Minion, MD;  Location: [place];  Service: Orthopedics;  Laterality: Right;  . AMPUTATION Left 08/21/2019   Procedure: LEFT FOOT 5TH RAY AMPUTATION;  Surgeon: Newt Minion, MD;  Location: [place];  Service: Orthopedics;  Laterality: Left;  . AMPUTATION Left 11/13/2019   Procedure: LEFT BELOW KNEE AMPUTATION;  Surgeon: Newt Minion, MD;  Location: [place];   Service: Orthopedics;  Laterality: Left;  . AV FISTULA PLACEMENT Right 09/08/2015   Procedure: INSERTION OF 4-53m x 45cm  ARTERIOVENOUS (AV) GORE-TEX GRAFT RIGHT UPPER  ARM;  Surgeon: CAngelia Mould MD;  [place]  Service: Vascular;  Laterality: Right;  . AV FISTULA PLACEMENT Left 01/14/2016   Procedure: CREATION OF LEFT UPPER ARM ARTERIOVENOUS FISTULA;  Surgeon: CAngelia Mould MD;  [place]  Service: Vascular;  Laterality: Left;  . BASCILIC VEIN TRANSPOSITION Right 08/22/2014   Procedure: RIGHT UPPER ARM BASCILIC VEIN TRANSPOSITION;  Surgeon: CAngelia Mould MD;  [place]  Service: Vascular;  Laterality: Right;  . BELOW KNEE LEG AMPUTATION Right 04/26/2016  . CARDIAC CATHETERIZATION    . CARDIAC DEFIBRILLATOR PLACEMENT  06/27/2013   Sub Q       BY DR KCaryl Comes . CATARACT EXTRACTION W/PHACO Right 08/06/2018   Procedure: CATARACT EXTRACTION PHACO AND INTRAOCULAR LENS PLACEMENT (IOC);  Surgeon: WBaruch Goldmann MD;  [facility];  Service: Ophthalmology;  Laterality: Right;  CDE: 4.06  . CATARACT EXTRACTION W/PHACO Left 08/20/2018   Procedure: CATARACT EXTRACTION PHACO AND INTRAOCULAR LENS PLACEMENT (IOC);  Surgeon: WBaruch Goldmann MD;  [facility]: [facility];  Service: Ophthalmology;  Laterality: Left;  CDE: 6.76  . COLONOSCOPY WITH PROPOFOL N/A 07/22/2015   Procedure: COLONOSCOPY WITH PROPOFOL;  Surgeon: HDoran Stabler MD;  [facility];  Service: Gastroenterology;  Laterality: N/A;  . FEMORAL-POPLITEAL BYPASS GRAFT Right 03/31/2016   Procedure: BYPASS GRAFT FEMORAL-POPLITEAL ARTERY USING RIGHT GREATER SAPHENOUS NONREVERSED VEIN;  Surgeon: CAngelia Mould MD;  [place]  Service: Vascular;  Laterality: Right;  . HERNIA REPAIR    . I & D EXTREMITY Right 03/31/2016   Procedure: IRRIGATION AND DEBRIDEMENT FOOT;  Surgeon: CAngelia Mould MD;  [place]  Service: Vascular;  Laterality: Right;  . IMPLANTABLE CARDIOVERTER  DEFIBRILLATOR IMPLANT N/A 06/27/2013   Procedure: SUB Q ICD;  Surgeon: SDeboraha Sprang MD;  [facility];  Service: Cardiovascular;  Laterality: N/A;  . INTRAOPERATIVE ARTERIOGRAM Right 03/31/2016   Procedure: INTRA OPERATIVE ARTERIOGRAM;  Surgeon: Angelia Mould, MD;  Location: [place];  Service: Vascular;  Laterality: Right;  . IR GENERIC HISTORICAL Right 11/30/2015   IR THROMBECTOMY AV FISTULA W/THROMBOLYSIS/PTA INC/SHUNT/IMG RIGHT 11/30/2015 Aletta Edouard, MD [facility]  . IR GENERIC HISTORICAL  11/30/2015   IR US GUIDE VASC ACCESS RIGHT 11/30/2015 Aletta Edouard, MD [facility]  . IR GENERIC HISTORICAL Right 12/15/2015   IR THROMBECTOMY AV FISTULA W/THROMBOLYSIS/PTA/STENT INC/SHUNT/IMG RT 12/15/2015 Arne Cleveland, MD [facility]  . IR GENERIC HISTORICAL  12/15/2015   IR US GUIDE VASC ACCESS RIGHT 12/15/2015 Arne Cleveland, MD [facility]  . IR GENERIC HISTORICAL  12/28/2015   IR FLUORO GUIDE CV LINE RIGHT 12/28/2015 Marybelle Killings, MD [facility]  . IR GENERIC HISTORICAL  12/28/2015   IR US GUIDE VASC ACCESS RIGHT 12/28/2015 Marybelle Killings, MD [facility]  . LEFT A ND RIGHT HEART CATH  01/30/2013   DR Sung Amabile  . LEFT AND RIGHT HEART CATHETERIZATION WITH CORONARY ANGIOGRAM N/A 01/30/2013   Procedure: LEFT AND RIGHT HEART CATHETERIZATION WITH CORONARY ANGIOGRAM;  Surgeon: Jolaine Artist, MD;  Location: [facility];  Service: Cardiovascular;  Laterality: N/A;  . PERIPHERAL VASCULAR CATHETERIZATION Right 01/26/2015   Procedure: A/V Fistulagram;  Surgeon: Angelia Mould, MD;  Location: [place] [facility];  Service: Cardiovascular;  Laterality: Right;  . reapea urethral surgery for recurrent obstruction  2011  . TOTAL KNEE ARTHROPLASTY Right 2007  . VEIN HARVEST Right 03/31/2016   Procedure: RIGHT GREATER SAPHENOUS VEIN HARVEST;  Surgeon: Angelia Mould, MD;  Location: [place];  Service: Vascular;  Laterality: Right;   Social History   Occupational History   . Not on file  Tobacco Use  . Smoking status: Former Smoker    Packs/day: 2.00    Years: 32.00    Pack years: 64.00    Types: Cigarettes    Quit date: 05/11/2010    Years since quitting: 9.6  . Smokeless tobacco: Never Used  Vaping Use  . Vaping Use: Never used  Substance and Sexual Activity  . Alcohol use: No  . Drug use: No  . Sexual activity: Yes

## 2020-01-07 NOTE — Telephone Encounter (Signed)
Patients wife called she stated the patient is in pain on a scale from 1-10 patient said a 8, wife stated his leg is warm to touch but no swelling or redness. She stated she hasn't seen the home health aid since before christmas she doesn't know if a aid will be coming back. [phone]

## 2020-01-08 ENCOUNTER — Encounter: Payer: Self-pay | Admitting: Family Medicine

## 2020-01-08 ENCOUNTER — Other Ambulatory Visit: Payer: Self-pay

## 2020-01-08 ENCOUNTER — Ambulatory Visit (INDEPENDENT_AMBULATORY_CARE_PROVIDER_SITE_OTHER): Payer: Medicare Other | Admitting: Family Medicine

## 2020-01-08 VITALS — BP 100/60 | HR 72 | Ht 70.0 in | Wt 174.0 lb

## 2020-01-08 DIAGNOSIS — J309 Allergic rhinitis, unspecified: Secondary | ICD-10-CM

## 2020-01-08 DIAGNOSIS — I6523 Occlusion and stenosis of bilateral carotid arteries: Secondary | ICD-10-CM | POA: Diagnosis not present

## 2020-01-08 DIAGNOSIS — E162 Hypoglycemia, unspecified: Secondary | ICD-10-CM

## 2020-01-08 DIAGNOSIS — E1142 Type 2 diabetes mellitus with diabetic polyneuropathy: Secondary | ICD-10-CM

## 2020-01-08 DIAGNOSIS — S88112A Complete traumatic amputation at level between knee and ankle, left lower leg, initial encounter: Secondary | ICD-10-CM | POA: Diagnosis not present

## 2020-01-08 NOTE — Patient Instructions (Signed)
Diabetic Sliding Scale (I)    IF BLOOD SUGAR IS:   LESS THAN 100 ( NO INSULIN)   100-140 (2 UNITS)   141-180 (4 UNITS)   181-220 (6 UNITS)   221-260 (8 UNITS)   261-300 (10 UNITS)   301-340 (12 UNITS)   MORE THAN 341 UNITS (14 UNITS)

## 2020-01-08 NOTE — Progress Notes (Signed)
Established Patient Office Visit  Subjective:  Patient ID: Anthony Bullock, male    DOB: 12-02-1960  Age: 59 y.o. MRN: [id number]  CC:  Chief Complaint  Patient presents with  . Medication Management    HPI Anthony Bullock presents for concerns as follows  Recent issues with hypoglycemia.  He had admission back in November for nonhealing wound of the left foot and toe.  He developed gangrenous changes and had to undergo left below-knee amputation.  He did well following surgery.  He did have some nausea afterwards but that is improved at this time.  He has good appetite at this time.  He still having some hypoglycemia.  He is on Basaglar insulin 12 units daily but has reduced this and some days taking less.  He is on sliding scale with Humalog and using this 3 times daily.  He apparently was given more aggressive scale in the hospital and was blotting out some there.  His recent A1c was 6.5%.  Wife relates he has been much more strict with his diet and eating patterns and also watching sodium intake closely.  Has had little bit of dizziness.  His carvedilol was increased to 25 mg twice daily in the hospital.  No syncope.  No chest pain.   He has end-stage renal disease and is on hemodialysis.  He is having increased allergy symptoms.  Using Allegra without good relief.  Still has frequent nasal congestion.  No purulent secretions.  Past Medical History:  Diagnosis Date  . 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 Allergic rhinitis   . Anemia   . Arthritis   . Chronic systolic heart failure ([place])    a. ECHO (12/2012) EF 25-30%, HK entireanteroseptal myocardium //  b.  EF 25%, diffuse HK, grade 1 diastolic dysfunction, MAC, mild LAE, normal RVSF, trivial pericardial effusion  . COPD (chronic obstructive pulmonary disease) ([place])   . Diabetes mellitus type II   . Diabetic nephropathy ([place])   . Diabetic neuropathy ([place])   . ESRD on hemodialysis [facility])     started HD June 2017, goes to [facility] HD unit, Dr Hinda Lenis  . History of cardiac catheterization    a.Myoview 1/15:  There is significant left ventricular dysfunction. There may be slight scar at the apex. There is no significant ischemia. LV Ejection Fraction: 27%  //  b. RHC/LHC (1/15) with mean RA 6, PA 47/22 mean 33, mean PCWP 20, PVR 2.5 WU, CI 2.5; 80% dLAD stenosis, 70% diffuse large D.   . History of kidney stones   . Hyperlipidemia   . Hypertension   . Kidney stones   . NICM (nonischemic cardiomyopathy) ([place])    Primarily nonischemic. Echo (12/14) with EF 25-30%. Echo (3/15) with EF 25%, mild to moderately dilated LV, normal RV size and systolic function.   . Osteomyelitis ([place])    left fifth ray  . Pneumonia   . Urethral stricture   . Wears glasses     Past Surgical History:  Procedure Laterality Date  . ABDOMINAL AORTOGRAM W/LOWER EXTREMITY N/A 03/30/2016   Procedure: Abdominal Aortogram w/Lower Extremity;  Surgeon: Angelia Mould, MD;  Location: [place] CV [facility];  Service: Cardiovascular;  Laterality: N/A;  . AMPUTATION Right 04/26/2016   Procedure: Right Below Knee Amputation;  Surgeon: Newt Minion, MD;  Location: [place];  Service: Orthopedics;  Laterality: Right;  . AMPUTATION Left 08/21/2019   Procedure: LEFT FOOT 5TH RAY AMPUTATION;  Surgeon: Newt Minion, MD;  Location: [place];  Service: Orthopedics;  Laterality: Left;  . AMPUTATION Left 11/13/2019   Procedure: LEFT BELOW KNEE AMPUTATION;  Surgeon: Newt Minion, MD;  Location: [place];  Service: Orthopedics;  Laterality: Left;  . AV FISTULA PLACEMENT Right 09/08/2015   Procedure: INSERTION OF 4-29m x 45cm  ARTERIOVENOUS (AV) GORE-TEX GRAFT RIGHT UPPER  ARM;  Surgeon: CAngelia Mould MD;  [place]  Service: Vascular;  Laterality: Right;  . AV FISTULA PLACEMENT Left 01/14/2016   Procedure: CREATION OF LEFT UPPER ARM ARTERIOVENOUS FISTULA;  Surgeon: CAngelia Mould MD;  [place]   Service: Vascular;  Laterality: Left;  . BASCILIC VEIN TRANSPOSITION Right 08/22/2014   Procedure: RIGHT UPPER ARM BASCILIC VEIN TRANSPOSITION;  Surgeon: CAngelia Mould MD;  [place]  Service: Vascular;  Laterality: Right;  . BELOW KNEE LEG AMPUTATION Right 04/26/2016  . CARDIAC CATHETERIZATION    . CARDIAC DEFIBRILLATOR PLACEMENT  06/27/2013   Sub Q       BY DR KCaryl Comes . CATARACT EXTRACTION W/PHACO Right 08/06/2018   Procedure: CATARACT EXTRACTION PHACO AND INTRAOCULAR LENS PLACEMENT (IOC);  Surgeon: WBaruch Goldmann MD;  [facility];  Service: Ophthalmology;  Laterality: Right;  CDE: 4.06  . CATARACT EXTRACTION W/PHACO Left 08/20/2018   Procedure: CATARACT EXTRACTION PHACO AND INTRAOCULAR LENS PLACEMENT (IOC);  Surgeon: WBaruch Goldmann MD;  Location: [facility];  Service: Ophthalmology;  Laterality: Left;  CDE: 6.76  . COLONOSCOPY WITH PROPOFOL N/A 07/22/2015   Procedure: COLONOSCOPY WITH PROPOFOL;  Surgeon: HDoran Stabler MD;  [facility];  Service: Gastroenterology;  Laterality: N/A;  . FEMORAL-POPLITEAL BYPASS GRAFT Right 03/31/2016   Procedure: BYPASS GRAFT FEMORAL-POPLITEAL ARTERY USING RIGHT GREATER SAPHENOUS NONREVERSED VEIN;  Surgeon: CAngelia Mould MD;  [place]  Service: Vascular;  Laterality: Right;  . HERNIA REPAIR    . I & D EXTREMITY Right 03/31/2016   Procedure: IRRIGATION AND DEBRIDEMENT FOOT;  Surgeon: CAngelia Mould MD;  [place]  Service: Vascular;  Laterality: Right;  . IMPLANTABLE CARDIOVERTER DEFIBRILLATOR IMPLANT N/A 06/27/2013   Procedure: SUB Q ICD;  Surgeon: SDeboraha Sprang MD;  [facility];  Service: Cardiovascular;  Laterality: N/A;  . INTRAOPERATIVE ARTERIOGRAM Right 03/31/2016   Procedure: INTRA OPERATIVE ARTERIOGRAM;  Surgeon: CAngelia Mould MD;  [place]  Service: Vascular;  Laterality: Right;  . IR GENERIC HISTORICAL Right 11/30/2015   IR THROMBECTOMY AV FISTULA W/THROMBOLYSIS/PTA  INC/SHUNT/IMG RIGHT 11/30/2015 GAletta Edouard MD [facility]  . IR GENERIC HISTORICAL  11/30/2015   IR [place] ACCESS RIGHT 11/30/2015 GAletta Edouard MD [facility]  . IR GENERIC HISTORICAL Right 12/15/2015   IR THROMBECTOMY AV FISTULA W/THROMBOLYSIS/PTA/STENT INC/SHUNT/IMG RT 12/15/2015 DArne Cleveland MD [facility]  . IR GENERIC HISTORICAL  12/15/2015   IR [place] VASC ACCESS RIGHT 12/15/2015 DArne Cleveland MD [facility]  . IR GENERIC HISTORICAL  12/28/2015   IR FLUORO GUIDE CV LINE RIGHT 12/28/2015 AMarybelle Killings MD [facility]  . IR GENERIC HISTORICAL  12/28/2015   IR [place] V[place] ACCESS RIGHT 12/28/2015 AMarybelle Killings MD [facility]  . LEFT A ND RIGHT HEART CATH  01/30/2013   DR BSung Amabile . LEFT AND RIGHT HEART CATHETERIZATION WITH CORONARY ANGIOGRAM N/A 01/30/2013   Procedure: LEFT AND RIGHT HEART CATHETERIZATION WITH CORONARY ANGIOGRAM;  Surgeon: DJolaine Artist MD;  [facility];  Service: Cardiovascular;  Laterality: N/A;  .  PERIPHERAL VASCULAR CATHETERIZATION Right 01/26/2015   Procedure: A/V Fistulagram;  Surgeon: Angelia Mould, MD;  Location: [place] [facility];  Service: Cardiovascular;  Laterality: Right;  . reapea urethral surgery for recurrent obstruction  2011  . TOTAL KNEE ARTHROPLASTY Right 2007  . VEIN HARVEST Right 03/31/2016   Procedure: RIGHT GREATER SAPHENOUS VEIN HARVEST;  Surgeon: Angelia Mould, MD;  Location: [facility];  Service: Vascular;  Laterality: Right;    Family History  Problem Relation Age of Onset  . Bladder Cancer Mother   . Alcohol abuse Father   . Melanoma Father   . Stroke Maternal Grandmother   . Heart Problems Maternal Grandmother        unknown  . Diabetes Maternal Grandmother   . Heart disease Maternal Grandfather   . Prostate cancer Maternal Grandfather     Social History   Socioeconomic History  . Marital status: Married    Spouse name: Not on file  . Number of children: 0  . Years of  education: Not on file  . Highest education level: Not on file  Occupational History  . Not on file  Tobacco Use  . Smoking status: Former Smoker    Packs/day: 2.00    Years: 32.00    Pack years: 64.00    Types: Cigarettes    Quit date: 05/11/2010    Years since quitting: 9.6  . Smokeless tobacco: Never Used  Vaping Use  . Vaping Use: Never used  Substance and Sexual Activity  . Alcohol use: No  . Drug use: No  . Sexual activity: Yes  Other Topics Concern  . 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 acetaminophen (TYLENOL) 325 MG tablet Take 1-2 tablets (325-650 mg total) by mouth every 4 (four) hours as needed for mild pain.    [name] albuterol (VENTOLIN HFA) 108 (90 Base) MCG/ACT inhaler Inhale 2 puffs into the lungs every 4 (four) hours as needed for wheezing or shortness of breath. 1 each 1  . amLODipine (NORVASC) 10 MG tablet Take 0.5 tablets (5 mg total) by mouth daily. 15 tablet 0  . aspirin EC 81 MG tablet Take 81 mg by mouth daily.     [name] atorvastatin (LIPITOR) 80 MG tablet Take 1 tablet (80 mg total) by mouth at bedtime. 90 tablet 3  . azelastine (ASTELIN) 0.1 % nasal spray Place 2 sprays into both nostrils 3 (three) times daily as needed for rhinitis. Use in each nostril as directed 30 mL 12  . carvedilol (COREG) 25 MG tablet Take 1 tablet (25 mg total) by mouth 2 (two) times daily. 60 tablet 0  . cinacalcet (SENSIPAR) 30 MG tablet Take 1 tablet (30 mg total) by mouth Every Tuesday,Thursday,and Saturday with dialysis. 60 tablet   . Continuous Blood Gluc Sensor (FREESTYLE LIBRE 14 DAY SENSOR) MISC USE AS  DIRECTED EVERY 14 DAYS 4 each 11  . doxercalciferol (HECTOROL) 4 MCG/2ML injection Inject 1.25 mLs (2.5 mcg total) into the vein Every Tuesday,Thursday,and Saturday with dialysis. 2 mL   . doxycycline (VIBRA-TABS) 100 MG tablet  Take 1 tablet (100 mg total) by mouth 2 (two) times daily. 60 tablet 0  . famotidine (PEPCID) 10 MG tablet Take 1 tablet (10 mg total) by mouth at bedtime. 30 tablet 0  . fenofibrate 160 MG tablet Take 1 tablet (160 mg total) by mouth daily. 90 tablet 3  . fexofenadine (ALLEGRA) 180 MG tablet Take 180 mg by mouth daily.    . furosemide (LASIX) 40 MG tablet Take 3 tablets by mouth once daily (Patient taking differently: Take 120 mg by mouth daily.) 270 tablet 0  . gabapentin (NEURONTIN) 100 MG capsule Take one pill in morning and two pills at bedtime. 90 capsule 0  . glucose blood test strip Check 1 time daily. E11.9 One Touch Ultra Blue Test Strips 100 each 3  . Insulin Glargine (BASAGLAR KWIKPEN) 100 UNIT/ML INJECT 12 UNITS SUBCUTANEOUSLY AT BEDTIME 15 mL 11  . Insulin Lispro (HUMALOG KWIKPEN) 200 UNIT/ML SOPN Inject 10 Units into the skin 3 (three) times daily with meals. Use tid with meals as per sliding scale (Patient taking differently: Inject 2-14 Units into the skin 3 (three) times daily with meals. sliding scale) 4 pen 6  . Insulin Pen Needle (BD PEN NEEDLE NANO U/F) 32G X 4 MM MISC USE 1 PEN NEEDLE SUBCUTANEOUSLY WITH INSULIN 4 TIMES DAILY 400 each 0  . lanthanum (FOSRENOL) 500 MG chewable tablet Chew 500-1,000 mg by mouth See admin instructions. Take 2000 mg with meals three time a day and 500 mg with snacks    . multivitamin (RENA-VIT) TABS tablet Take 1 tablet by mouth daily. 90 tablet 3  . Nutritional Supplements (,FEEDING SUPPLEMENT, PROSOURCE PLUS) liquid Take 30 mLs by mouth 2 (two) times daily between meals. 887 mL 0  . Olopatadine HCl 0.2 % SOLN Place 1 drop into both eyes daily as needed (for allergies).     . ondansetron (ZOFRAN) 4 MG tablet Take 1 tablet  (4 mg total) by mouth every 6 (six) hours as needed for nausea. 20 tablet 0  . senna-docusate (SENOKOT-S) 8.6-50 MG tablet Take 2 tablets by mouth daily after supper. 60 tablet 0   No facility-administered medications prior to visit.    Allergies  Allergen Reactions  . Epoetin Alfa Other (See Comments)    unknown  . Ferumoxytol Other (See Comments)    unknown  . Morphine Sulfate Rash and Other (See Comments)    Itches all over, red spots    ROS Review of Systems  Constitutional: Negative for chills and fever.  Respiratory: Negative for shortness of breath.   Cardiovascular: Negative for chest pain.  Gastrointestinal: Negative for abdominal pain.  Genitourinary: Negative for dysuria.  Neurological: Negative for syncope.  Psychiatric/Behavioral: Negative for confusion.      Objective:    Physical Exam Vitals reviewed.  Cardiovascular:     Rate and Rhythm: Normal rate and regular rhythm.  Pulmonary:     Effort: Pulmonary effort is normal.     Breath sounds: Normal breath sounds.  Musculoskeletal:     Comments: Bilateral BKAs  Neurological:     Mental Status: He is alert.     BP 100/60   Pulse 72   Ht _0  (1.778 m)   Wt 174 lb (78.9 kg)   SpO2 100%   BMI 24.97 kg/m  Wt Readings from Last 3 Encounters:  01/08/20 174 lb (78.9 kg)  01/07/20 178 lb (80.7 kg)  01/01/20 178 lb (80.7 kg)     Health Maintenance Due  Topic  Date Due  . COVID-19 Vaccine (2 - Moderna 3-dose booster series) 05/16/2019    There are no preventive care reminders to display for this patient.  Lab Results  Component Value Date   TSH 0.751 09/04/2019   Lab Results  Component Value Date   WBC 8.0 11/28/2019   HGB 10.0 (L) 11/28/2019   HCT 32.1 (L) 11/28/2019   MCV 92.5 11/28/2019   PLT 148 (L) 11/28/2019   Lab Results  Component Value Date   NA 134 (L) 11/28/2019   K 5.6 (H) 11/28/2019   CO2 26 11/28/2019   GLUCOSE 139 (H) 11/28/2019   BUN 63 (H) 11/28/2019   CREATININE  10.21 (H) 11/28/2019   BILITOT 0.6 09/04/2019   ALKPHOS 50 09/04/2019   AST 18 09/04/2019   ALT 14 09/04/2019   PROT 6.8 09/04/2019   ALBUMIN 2.8 (L) 11/28/2019   CALCIUM 9.2 11/28/2019   ANIONGAP 12 11/28/2019   GFR 12.30 (LL) 06/27/2014   Lab Results  Component Value Date   CHOL 158 05/08/2019   Lab Results  Component Value Date   HDL 37.00 (L) 05/08/2019   Lab Results  Component Value Date   LDLCALC 84 05/08/2019   Lab Results  Component Value Date   TRIG 187.0 (H) 05/08/2019   Lab Results  Component Value Date   CHOLHDL 4 05/08/2019   Lab Results  Component Value Date   HGBA1C 6.5 (H) 11/13/2019      Assessment & Plan:   #1 type 2 diabetes on insulin with recent multiple hypoglycemia episodes  -Continue Basaglar 12 units once daily -We gave him conservative sliding scale for insulin with Humalog 3 times daily with meals and if still having hypoglycemic episodes with that we will modify having further -If fasting blood sugars continue to be consistently less than 120 reduce by 2 units every few days his Basaglar until blood sugar stable -We will plan follow-up in 2 to 3 months and recheck A1c then.  Recent A1c well controlled at 6.5%.  #2 allergic rhinitis -Add over-the-counter Flonase to his Allegra  #3 recent left below-knee amputation secondary to gangrenous extremity.  Patient doing well this time.  Is encouraged to continue close follow-up with orthopedics.  #4 end-stage renal disease on hemodialysis  No orders of the defined types were placed in this encounter.   Follow-up: Return in about 3 months (around 04/07/2020).    Carolann Littler, MD

## 2020-01-09 DIAGNOSIS — D509 Iron deficiency anemia, unspecified: Secondary | ICD-10-CM | POA: Diagnosis not present

## 2020-01-09 DIAGNOSIS — D631 Anemia in chronic kidney disease: Secondary | ICD-10-CM | POA: Diagnosis not present

## 2020-01-09 DIAGNOSIS — Z992 Dependence on renal dialysis: Secondary | ICD-10-CM | POA: Diagnosis not present

## 2020-01-09 DIAGNOSIS — N186 End stage renal disease: Secondary | ICD-10-CM | POA: Diagnosis not present

## 2020-01-09 DIAGNOSIS — E209 Hypoparathyroidism, unspecified: Secondary | ICD-10-CM | POA: Diagnosis not present

## 2020-01-09 DIAGNOSIS — N2581 Secondary hyperparathyroidism of renal origin: Secondary | ICD-10-CM | POA: Diagnosis not present

## 2020-01-10 DIAGNOSIS — Z992 Dependence on renal dialysis: Secondary | ICD-10-CM | POA: Diagnosis not present

## 2020-01-10 DIAGNOSIS — N186 End stage renal disease: Secondary | ICD-10-CM | POA: Diagnosis not present

## 2020-01-11 DIAGNOSIS — N2581 Secondary hyperparathyroidism of renal origin: Secondary | ICD-10-CM | POA: Diagnosis not present

## 2020-01-11 DIAGNOSIS — N186 End stage renal disease: Secondary | ICD-10-CM | POA: Diagnosis not present

## 2020-01-11 DIAGNOSIS — E209 Hypoparathyroidism, unspecified: Secondary | ICD-10-CM | POA: Diagnosis not present

## 2020-01-11 DIAGNOSIS — E559 Vitamin D deficiency, unspecified: Secondary | ICD-10-CM | POA: Diagnosis not present

## 2020-01-11 DIAGNOSIS — Z992 Dependence on renal dialysis: Secondary | ICD-10-CM | POA: Diagnosis not present

## 2020-01-11 DIAGNOSIS — D509 Iron deficiency anemia, unspecified: Secondary | ICD-10-CM | POA: Diagnosis not present

## 2020-01-11 DIAGNOSIS — D631 Anemia in chronic kidney disease: Secondary | ICD-10-CM | POA: Diagnosis not present

## 2020-01-13 ENCOUNTER — Ambulatory Visit: Payer: Medicare Other | Admitting: Physician Assistant

## 2020-01-14 DIAGNOSIS — E209 Hypoparathyroidism, unspecified: Secondary | ICD-10-CM | POA: Diagnosis not present

## 2020-01-14 DIAGNOSIS — N186 End stage renal disease: Secondary | ICD-10-CM | POA: Diagnosis not present

## 2020-01-14 DIAGNOSIS — D509 Iron deficiency anemia, unspecified: Secondary | ICD-10-CM | POA: Diagnosis not present

## 2020-01-14 DIAGNOSIS — Z992 Dependence on renal dialysis: Secondary | ICD-10-CM | POA: Diagnosis not present

## 2020-01-14 DIAGNOSIS — E559 Vitamin D deficiency, unspecified: Secondary | ICD-10-CM | POA: Diagnosis not present

## 2020-01-14 DIAGNOSIS — N2581 Secondary hyperparathyroidism of renal origin: Secondary | ICD-10-CM | POA: Diagnosis not present

## 2020-01-15 ENCOUNTER — Ambulatory Visit: Payer: Medicare Other | Admitting: Physician Assistant

## 2020-01-16 DIAGNOSIS — D509 Iron deficiency anemia, unspecified: Secondary | ICD-10-CM | POA: Diagnosis not present

## 2020-01-16 DIAGNOSIS — E209 Hypoparathyroidism, unspecified: Secondary | ICD-10-CM | POA: Diagnosis not present

## 2020-01-16 DIAGNOSIS — E559 Vitamin D deficiency, unspecified: Secondary | ICD-10-CM | POA: Diagnosis not present

## 2020-01-16 DIAGNOSIS — Z992 Dependence on renal dialysis: Secondary | ICD-10-CM | POA: Diagnosis not present

## 2020-01-16 DIAGNOSIS — N2581 Secondary hyperparathyroidism of renal origin: Secondary | ICD-10-CM | POA: Diagnosis not present

## 2020-01-16 DIAGNOSIS — N186 End stage renal disease: Secondary | ICD-10-CM | POA: Diagnosis not present

## 2020-01-18 DIAGNOSIS — E559 Vitamin D deficiency, unspecified: Secondary | ICD-10-CM | POA: Diagnosis not present

## 2020-01-18 DIAGNOSIS — D509 Iron deficiency anemia, unspecified: Secondary | ICD-10-CM | POA: Diagnosis not present

## 2020-01-18 DIAGNOSIS — Z992 Dependence on renal dialysis: Secondary | ICD-10-CM | POA: Diagnosis not present

## 2020-01-18 DIAGNOSIS — E209 Hypoparathyroidism, unspecified: Secondary | ICD-10-CM | POA: Diagnosis not present

## 2020-01-18 DIAGNOSIS — N186 End stage renal disease: Secondary | ICD-10-CM | POA: Diagnosis not present

## 2020-01-18 DIAGNOSIS — N2581 Secondary hyperparathyroidism of renal origin: Secondary | ICD-10-CM | POA: Diagnosis not present

## 2020-01-20 ENCOUNTER — Other Ambulatory Visit: Payer: Self-pay

## 2020-01-20 ENCOUNTER — Ambulatory Visit: Payer: Medicare Other | Admitting: Physician Assistant

## 2020-01-20 ENCOUNTER — Ambulatory Visit (INDEPENDENT_AMBULATORY_CARE_PROVIDER_SITE_OTHER): Payer: Medicare Other

## 2020-01-20 DIAGNOSIS — Z0000 Encounter for general adult medical examination without abnormal findings: Secondary | ICD-10-CM | POA: Diagnosis not present

## 2020-01-20 NOTE — Progress Notes (Signed)
Subjective:   Anthony Bullock is a 60 y.o. male who presents for an Initial Medicare Annual Wellness Visit.   Virtual Visit via Video Note  I connected with Anthony Bullock on 01/20/20 at 11:15 AM EST by a video enabled telemedicine application and verified that I am speaking with the correct person using two identifiers.  Location: Patient: Home  Provider: office    I discussed the limitations of evaluation and management by telemedicine and the availability of in person appointments. The patient expressed understanding and agreed to proceed.    Ofilia Neas, LPN    Review of Systems    N/A  Cardiac Risk Factors include: advanced age (>65mn, >>26women);male gender;diabetes mellitus;hypertension;dyslipidemia     Objective:    There were no vitals filed for this visit. There is no height or weight on file to calculate BMI.  Advanced Directives 01/20/2020 11/18/2019 11/16/2019 11/13/2019 11/13/2019 09/03/2019 09/02/2019  Does Patient Have a Medical Advance Directive? No No No - No No No  Type of Advance Directive - [place] - - - - -  Does patient want to make changes to medical advance directive? - Yes (Inpatient - patient requests chaplain consult to change a medical advance directive) - - - - -  [place] of [place]? - No - copy requested - - - - -  Would patient like information on creating a medical advance directive? No - Patient declined - No - Patient declined No - Patient declined - No - Patient declined -  Pre-existing out of facility DNR order (yellow form or pink MOST form) - - - - - - -    Current Medications (verified) Outpatient Encounter Medications as of 01/20/2020  Medication Sig  . acetaminophen (TYLENOL) 325 MG tablet Take 1-2 tablets (325-650 mg total) by mouth every 4 (four) hours as needed for mild pain.  .Marland Kitchenalbuterol (VENTOLIN HFA) 108 (90 Base) MCG/ACT inhaler Inhale 2 puffs into the lungs every 4  (four) hours as needed for wheezing or shortness of breath.  .Marland KitchenamLODipine (NORVASC) 10 MG tablet Take 0.5 tablets (5 mg total) by mouth daily.  .Marland Kitchenaspirin EC 81 MG tablet Take 81 mg by mouth daily.   .Marland Kitchenatorvastatin (LIPITOR) 80 MG tablet Take 1 tablet (80 mg total) by mouth at bedtime.  .Marland Kitchenazelastine (ASTELIN) 0.1 % nasal spray Place 2 sprays into both nostrils 3 (three) times daily as needed for rhinitis. Use in each nostril as directed  . carvedilol (COREG) 25 MG tablet Take 1 tablet (25 mg total) by mouth 2 (two) times daily.  . cinacalcet (SENSIPAR) 30 MG tablet Take 1 tablet (30 mg total) by mouth Every Tuesday,Thursday,and Saturday with dialysis.  . Continuous Blood Gluc Sensor (FREESTYLE LIBRE 14 DAY SENSOR) MISC USE AS DIRECTED EVERY 14 DAYS  . doxercalciferol (HECTOROL) 4 MCG/2ML injection Inject 1.25 mLs (2.5 mcg total) into the vein Every Tuesday,Thursday,and Saturday with dialysis.  .Marland Kitchendoxycycline (VIBRA-TABS) 100 MG tablet Take 1 tablet (100 mg total) by mouth 2 (two) times daily.  . fenofibrate 160 MG tablet Take 1 tablet (160 mg total) by mouth daily.  . fexofenadine (ALLEGRA) 180 MG tablet Take 180 mg by mouth daily.  . furosemide (LASIX) 40 MG tablet Take 3 tablets by mouth once daily (Patient taking differently: Take 120 mg by mouth daily.)  . gabapentin (NEURONTIN) 100 MG capsule Take one pill in morning and two pills at bedtime.  .[name]  glucose blood test strip Check 1 time daily. E11.9 One Touch Ultra Blue Test Strips  . Insulin Glargine (BASAGLAR KWIKPEN) 100 UNIT/ML INJECT 12 UNITS SUBCUTANEOUSLY AT BEDTIME  . Insulin Lispro (HUMALOG KWIKPEN) 200 UNIT/ML SOPN Inject 10 Units into the skin 3 (three) times daily with meals. Use tid with meals as per sliding scale (Patient taking differently: Inject 2-14 Units into the skin 3 (three) times daily with meals. sliding scale)  . Insulin Pen Needle (BD PEN NEEDLE NANO U/F) 32G X 4 MM MISC USE 1 PEN NEEDLE SUBCUTANEOUSLY WITH INSULIN 4 TIMES  DAILY  . lanthanum (FOSRENOL) 500 MG chewable tablet Chew 500-1,000 mg by mouth See admin instructions. Take 2000 mg with meals three time a day and 500 mg with snacks  . multivitamin (RENA-VIT) TABS tablet Take 1 tablet by mouth daily.  . Nutritional Supplements (,FEEDING SUPPLEMENT, PROSOURCE PLUS) liquid Take 30 mLs by mouth 2 (two) times daily between meals.  . Olopatadine HCl 0.2 % SOLN Place 1 drop into both eyes daily as needed (for allergies).   . famotidine (PEPCID) 10 MG tablet Take 1 tablet (10 mg total) by mouth at bedtime. (Patient not taking: Reported on 01/20/2020)  . ondansetron (ZOFRAN) 4 MG tablet Take 1 tablet (4 mg total) by mouth every 6 (six) hours as needed for nausea. (Patient not taking: Reported on 01/20/2020)  . senna-docusate (SENOKOT-S) 8.6-50 MG tablet Take 2 tablets by mouth daily after supper. (Patient not taking: Reported on 01/20/2020)   No facility-administered encounter medications on file as of 01/20/2020.    Allergies (verified) Epoetin alfa, Ferumoxytol, and Morphine sulfate   History: Past Medical History:  Diagnosis Date  . 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 Allergic rhinitis   . Anemia   . Arthritis   . Chronic systolic heart failure ([place])    a. ECHO (12/2012) EF 25-30%, HK entireanteroseptal myocardium //  b.  EF 25%, diffuse HK, grade 1 diastolic dysfunction, MAC, mild LAE, normal RVSF, trivial pericardial effusion  . COPD (chronic obstructive pulmonary disease) ([place])   . Diabetes mellitus type II   . Diabetic nephropathy ([place])   . Diabetic neuropathy ([place])   . ESRD on hemodialysis [facility])    started HD June 2017, goes to [facility], Dr Hinda Lenis  . History of cardiac catheterization    a.Myoview 1/15:  There is significant left ventricular dysfunction. There may be slight scar at the apex. There is no significant ischemia. LV Ejection Fraction: 27%  //  b. RHC/LHC (1/15) with mean RA 6, PA 47/22  mean 33, mean PCWP 20, PVR 2.5 WU, CI 2.5; 80% dLAD stenosis, 70% diffuse large D.   . History of kidney stones   . Hyperlipidemia   . Hypertension   . Kidney stones   . NICM (nonischemic cardiomyopathy) ([place])    Primarily nonischemic. Echo (12/14) with EF 25-30%. Echo (3/15) with EF 25%, mild to moderately dilated LV, normal RV size and systolic function.   . Osteomyelitis ([place])    left fifth ray  . Pneumonia   . Urethral stricture   . Wears glasses    Past Surgical History:  Procedure Laterality Date  . ABDOMINAL AORTOGRAM W/LOWER EXTREMITY N/A 03/30/2016   Procedure: Abdominal Aortogram w/Lower Extremity;  Surgeon: Angelia Mould, MD;  Location: [place] [facility];  Service: Cardiovascular;  Laterality: N/A;  . AMPUTATION Right 04/26/2016   Procedure: Right Below Knee Amputation;  Surgeon: Newt Minion, MD;  Location: [place];  Service: Orthopedics;  Laterality: Right;  . AMPUTATION Left 08/21/2019   Procedure: LEFT FOOT 5TH RAY AMPUTATION;  Surgeon: Newt Minion, MD;  Location: [place];  Service: Orthopedics;  Laterality: Left;  . AMPUTATION Left 11/13/2019   Procedure: LEFT BELOW KNEE AMPUTATION;  Surgeon: Newt Minion, MD;  Location: [place];  Service: Orthopedics;  Laterality: Left;  . AV FISTULA PLACEMENT Right 09/08/2015   Procedure: INSERTION OF 4-76m x 45cm  ARTERIOVENOUS (AV) GORE-TEX GRAFT RIGHT UPPER  ARM;  Surgeon: CAngelia Mould MD;  [facility]  Service: Vascular;  Laterality: Right;  . AV FISTULA PLACEMENT Left 01/14/2016   Procedure: CREATION OF LEFT UPPER ARM ARTERIOVENOUS FISTULA;  Surgeon: CAngelia Mould MD;  [facility]  Service: Vascular;  Laterality: Left;  . BASCILIC VEIN TRANSPOSITION Right 08/22/2014   Procedure: RIGHT UPPER ARM BASCILIC VEIN TRANSPOSITION;  Surgeon: CAngelia Mould MD;  [place] [facility]  Service: Vascular;  Laterality: Right;  . BELOW KNEE LEG AMPUTATION Right 04/26/2016  . CARDIAC CATHETERIZATION    .  CARDIAC DEFIBRILLATOR PLACEMENT  06/27/2013   Sub Q       BY DR KCaryl Comes . CATARACT EXTRACTION W/PHACO Right 08/06/2018   Procedure: CATARACT EXTRACTION PHACO AND INTRAOCULAR LENS PLACEMENT (IOC);  Surgeon: WBaruch Goldmann MD;  [facility]: [facility];  Service: Ophthalmology;  Laterality: Right;  CDE: 4.06  . CATARACT EXTRACTION W/PHACO Left 08/20/2018   Procedure: CATARACT EXTRACTION PHACO AND INTRAOCULAR LENS PLACEMENT (IOC);  Surgeon: WBaruch Goldmann MD;  Location: [facility];  Service: Ophthalmology;  Laterality: Left;  CDE: 6.76  . COLONOSCOPY WITH PROPOFOL N/A 07/22/2015   Procedure: COLONOSCOPY WITH PROPOFOL;  Surgeon: HDoran Stabler MD;  [facility];  Service: Gastroenterology;  Laterality: N/A;  . FEMORAL-POPLITEAL BYPASS GRAFT Right 03/31/2016   Procedure: BYPASS GRAFT FEMORAL-POPLITEAL ARTERY USING RIGHT GREATER SAPHENOUS NONREVERSED VEIN;  Surgeon: CAngelia Mould MD;  [place] [facility]  Service: Vascular;  Laterality: Right;  . HERNIA REPAIR    . I & D EXTREMITY Right 03/31/2016   Procedure: IRRIGATION AND DEBRIDEMENT FOOT;  Surgeon: CAngelia Mould MD;  [facility]  Service: Vascular;  Laterality: Right;  . IMPLANTABLE CARDIOVERTER DEFIBRILLATOR IMPLANT N/A 06/27/2013   Procedure: SUB Q ICD;  Surgeon: SDeboraha Sprang MD;  [facility];  Service: Cardiovascular;  Laterality: N/A;  . INTRAOPERATIVE ARTERIOGRAM Right 03/31/2016   Procedure: INTRA OPERATIVE ARTERIOGRAM;  Surgeon: CAngelia Mould MD;  [facility]  Service: Vascular;  Laterality: Right;  . IR GENERIC HISTORICAL Right 11/30/2015   IR THROMBECTOMY AV FISTULA W/THROMBOLYSIS/PTA INC/SHUNT/IMG RIGHT 11/30/2015 GAletta Edouard MD [facility]  . IR GENERIC HISTORICAL  11/30/2015   IR [place] ACCESS RIGHT 11/30/2015 GAletta Edouard MD [facility]  . IR GENERIC HISTORICAL Right 12/15/2015   IR THROMBECTOMY AV FISTULA W/THROMBOLYSIS/PTA/STENT INC/SHUNT/IMG RT 12/15/2015 DArne Cleveland MD  [facility]  . IR GENERIC HISTORICAL  12/15/2015   IR [place] VASC ACCESS RIGHT 12/15/2015 DArne Cleveland MD [facility]  . IR GENERIC HISTORICAL  12/28/2015   IR FLUORO GUIDE CV LINE RIGHT 12/28/2015 AMarybelle Killings MD [facility]  . IR GENERIC HISTORICAL  12/28/2015   IR [place] VASC ACCESS RIGHT 12/28/2015 AMarybelle Killings MD [facility]  . LEFT A ND RIGHT HEART CATH  01/30/2013   DR BSung Amabile . LEFT AND RIGHT HEART CATHETERIZATION WITH CORONARY ANGIOGRAM N/A 01/30/2013   Procedure: LEFT AND  RIGHT HEART CATHETERIZATION WITH CORONARY ANGIOGRAM;  Surgeon: Jolaine Artist, MD;  Location: [facility];  Service: Cardiovascular;  Laterality: N/A;  . PERIPHERAL VASCULAR CATHETERIZATION Right 01/26/2015   Procedure: A/V Fistulagram;  Surgeon: Angelia Mould, MD;  Location: [place] [facility];  Service: Cardiovascular;  Laterality: Right;  . reapea urethral surgery for recurrent obstruction  2011  . TOTAL KNEE ARTHROPLASTY Right 2007  . VEIN HARVEST Right 03/31/2016   Procedure: RIGHT GREATER SAPHENOUS VEIN HARVEST;  Surgeon: Angelia Mould, MD;  [facility];  Service: Vascular;  Laterality: Right;   Family History  Problem Relation Age of Onset  . Bladder Cancer Mother   . Alcohol abuse Father   . Melanoma Father   . Stroke Maternal Grandmother   . Heart Problems Maternal Grandmother        unknown  . Diabetes Maternal Grandmother   . Heart disease Maternal Grandfather   . Prostate cancer Maternal Grandfather    Social History   Socioeconomic History  . Marital status: Married    Spouse name: Not on file  . Number of children: 0  . Years of education: Not on file  . Highest education level: Not on file  Occupational History  . Not on file  Tobacco Use  . Smoking status: Former Smoker    Packs/day: 2.00    Years: 32.00    Pack years: 64.00    Types: Cigarettes    Quit date: 05/11/2010    Years since quitting: 9.7  . Smokeless tobacco: Never Used  Vaping  Use  . Vaping Use: Never used  Substance and Sexual Activity  . Alcohol use: No  . Drug use: No  . Sexual activity: Yes  Other Topics Concern  . 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 Difficulty of Paying Living Expenses: Not hard at all  Food Insecurity: No Food Insecurity  . Worried About Charity fundraiser in the Last Year: Never true  . Ran Out of Food in the Last Year: Never true  Transportation Needs: No Transportation Needs  . Lack of Transportation (Medical): No  . Lack of Transportation (Non-Medical): No  Physical Activity: Inactive  . Days of Exercise per Week: 0 days  . Minutes of Exercise per Session: 0 min  Stress: No Stress Concern Present  . Feeling of Stress : Not at all  Social Connections: Moderately Integrated  . Frequency of Communication with Friends and Family: More than three times a week  . Frequency of Social Gatherings with Friends and Family: Twice a week  . Attends Religious Services: More than 4 times per year  . Active Member of Clubs or Organizations: No  . Attends Archivist Meetings: Never  . Marital Status: Married    Tobacco Counseling Counseling given: Not Answered   Clinical Intake:  Pre-visit preparation completed: Yes  Pain : No/denies pain     Nutritional Risks: Nausea/ vomitting/ diarrhea (Nausea, and vomiting) Diabetes: Yes CBG done?: No Did pt. bring in CBG monitor from home?: No  How often do you need to have someone help you when you read instructions, pamphlets, or other written materials from your doctor or pharmacy?: 1 - Never What is the last grade level you completed in school?: High School  Diabetic?Yes Nutrition Risk Assessment:  Has the patient had any N/V/D  within the last 2 months?  No  Does the patient have any non-healing wounds?  No  Has the patient had any unintentional weight loss  or weight gain?  No   Diabetes:  Is the patient diabetic?  Yes  If diabetic, was a CBG obtained today?  No  Did the patient bring in their glucometer from home?  No  How often do you monitor your CBG's? Patient states he is checking his glucose 3-4 per day.   Financial Strains and Diabetes Management:  Are you having any financial strains with the device, your supplies or your medication? No .  Does the patient want to be seen by Chronic Care Management for management of their diabetes?  No  Would the patient like to be referred to a Nutritionist or for Diabetic Management?  No   Diabetic Exams:  Diabetic Eye Exam: Completed 07/24/2019 Diabetic Foot Exam: Completed 03/20/2019   Interpreter Needed?: No  Information entered by :: [place] of Daily Living In your present state of health, do you have any difficulty performing the following activities: 01/20/2020 11/18/2019  Hearing? N N  Vision? N N  Difficulty concentrating or making decisions? N N  Walking or climbing stairs? Y Y  Dressing or bathing? Y Y  Doing errands, shopping? Y N  Preparing Food and eating ? Y -  Using the Toilet? N -  In the past six months, have you accidently leaked urine? N -  Do you have problems with loss of bowel control? N -  Managing your Medications? N -  Managing your Finances? N -  Housekeeping or managing your Housekeeping? N -  Some recent data might be hidden    Patient Care Team: [name], MD as PCP - General (Family Medicine) Harl Bowie Alphonse Guild, MD as PCP - Cardiology (Cardiology) Deboraha Sprang, MD as PCP - Electrophysiology (Cardiology) Deboraha Sprang, MD as Consulting Physician (Cardiology) Fran Lowes, MD (Inactive) as Consulting Physician (Nephrology) Harl Bowie Alphonse Guild, MD as Consulting Physician (Cardiology) [place], [facility], Revonda Standard, MD as Consulting Physician (Cardiology)  Indicate any recent Medical  Services you may have received from other than [facility] providers in the past year (date may be approximate).     Assessment:   This is a routine wellness examination for Anthony Bullock.  Hearing/Vision screen  Hearing Screening   _0  _1  _2  _3  _4  _5  _6  _7  _8   Right ear:           Left ear:           Vision Screening Comments: Patient gets eyes examined once per year    Dietary issues and exercise activities discussed: Exercise limited by: orthopedic condition(s)  Goals    . Patient Stated     I would like to get back on my feet again once I get my prothesis so I can be independent       Depression Screen PHQ 2/9 Scores 01/20/2020 01/01/2020 05/08/2019 11/15/2017 09/29/2014  PHQ - 2 Score 0 0 0 0 0  PHQ- 9 Score 0 0 - - -    Fall Risk Fall Risk  01/20/2020 01/01/2020 09/29/2014  Falls in the past year? 0 0 Yes  Number falls in past yr: 0 - 1  Injury with Fall? 0 - No  Risk for fall due to : Impaired mobility;Medication side effect - -  Follow up Falls evaluation completed;Falls prevention discussed - -    FALL RISK  PREVENTION PERTAINING TO THE HOME:  Any stairs in or around the home? No  If so, are there any without handrails? No  Home free of loose throw rugs in walkways, pet beds, electrical cords, etc? Yes  Adequate lighting in your home to reduce risk of falls? Yes   ASSISTIVE DEVICES UTILIZED TO PREVENT FALLS:  Life alert? No  Use of a cane, walker or w/c? Yes  Grab bars in the bathroom? No  Shower chair or bench in shower? Yes  Elevated toilet seat or a handicapped toilet? Yes     Cognitive Function:   Normal cognitive status assessed by direct observation by this Nurse Health Advisor. No abnormalities found.        Immunizations Immunization History  Administered Date(s) Administered  . Fluad Quad(high Dose 65+) 10/16/2019  . Hepatitis B, adult 07/27/2015, 08/28/2015, 09/30/2015, 02/01/2016, 03/21/2016  . Influenza Split 11/10/2010,  11/10/2011, 10/03/2012  . Influenza Whole 01/13/2010  . Influenza,inj,Quad PF,6+ Mos 09/29/2014, 10/10/2017  . Influenza-Unspecified 09/10/2013, 09/20/2015, 10/25/2016, 10/31/2017, 10/11/2018, 10/16/2019  . Moderna Sars-Covid-2 Vaccination 04/18/2019  . PPD Test 04/29/2015, 07/07/2015, 07/07/2015, 07/21/2015, 07/21/2015, 02/01/2016, 02/01/2016, 02/09/2017, 02/09/2017, 02/15/2018, 02/15/2018, 06/20/2019, 06/20/2019  . Pneumococcal Conjugate-13 09/23/2015  . Pneumococcal Polysaccharide-23 09/23/2015  . Pneumococcal-Unspecified 09/23/2015  . Td 10/03/2008    TDAP status: Due, Education has been provided regarding the importance of this vaccine. Advised may receive this vaccine at local pharmacy or Health Dept. Aware to provide a copy of the vaccination record if obtained from local pharmacy or Health Dept. Verbalized acceptance and understanding.  Flu Vaccine status: Up to date  Pneumococcal vaccine status: Up to date  Covid-19 vaccine status: Information provided on how to obtain vaccines.   Qualifies for Shingles Vaccine? Yes   Zostavax completed No   Shingrix Completed?: No.    Education has been provided regarding the importance of this vaccine. Patient has been advised to call insurance company to determine out of pocket expense if they have not yet received this vaccine. Advised may also receive vaccine at local pharmacy or Health Dept. Verbalized acceptance and understanding.  Screening Tests Health Maintenance  Topic Date Due  . COVID-19 Vaccine (2 - Moderna 3-dose booster series) 05/16/2019  . TETANUS/TDAP  05/07/2020 (Originally 10/04/2018)  . URINE MICROALBUMIN  12/07/2025 (Originally 02/18/2010)  . FOOT EXAM  03/19/2020  . HEMOGLOBIN A1C  05/12/2020  . OPHTHALMOLOGY EXAM  07/23/2020  . COLONOSCOPY (Pts 45-29yr Insurance coverage will need to be confirmed)  07/21/2025  . INFLUENZA VACCINE  Completed  . PNEUMOCOCCAL POLYSACCHARIDE VACCINE AGE 79-64 HIGH RISK  Completed  .  Hepatitis C Screening  Completed  . HIV Screening  Completed    Health Maintenance  Health Maintenance Due  Topic Date Due  . COVID-19 Vaccine (2 - Moderna 3-dose booster series) 05/16/2019    Colorectal cancer screening: Type of screening: Colonoscopy. Completed 07/22/2015. Repeat every 10 years  Lung Cancer Screening: (Low Dose CT Chest recommended if Age 59-80years, 30 pack-year currently smoking OR have quit w/in 15years.) does qualify.   Lung Cancer Screening Referral: No   Additional Screening:  Hepatitis C Screening: does qualify; Completed 01/25/2017  Vision Screening: Recommended annual ophthalmology exams for early detection of glaucoma and other disorders of the eye. Is the patient up to date with their annual eye exam?  Yes  Who is the provider or what is the name of the office in which the patient attends annual eye exams? [facility] [place] If  pt is not established with a provider, would they like to be referred to a provider to establish care? No .   Dental Screening: Recommended annual dental exams for proper oral hygiene  Community Resource Referral / Chronic Care Management: CRR required this visit?  No   CCM required this visit?  No      Plan:     I have personally reviewed and noted the following in the patient's chart:   . Medical and social history . Use of alcohol, tobacco or illicit drugs  . Current medications and supplements . Functional ability and status . Nutritional status . Physical activity . Advanced directives . List of other physicians . Hospitalizations, surgeries, and ER visits in previous 12 months . Vitals . Screenings to include cognitive, depression, and falls . Referrals and appointments  In addition, I have reviewed and discussed with patient certain preventive protocols, quality metrics, and best practice recommendations. A written personalized care plan for preventive services as well as general  preventive health recommendations were provided to patient.     Ofilia Neas, LPN   7/36/6815   Nurse Notes: None

## 2020-01-20 NOTE — Patient Instructions (Signed)
Anthony Bullock , Thank you for taking time to come for your Medicare Wellness Visit. I appreciate your ongoing commitment to your health goals. Please review the following plan we discussed and let me know if I can assist you in the future.   Screening recommendations/referrals: Colonoscopy: Up to date, next due 07/21/2025 Recommended yearly ophthalmology/optometry visit for glaucoma screening and checkup Recommended yearly dental visit for hygiene and checkup  Vaccinations: Influenza vaccine: Up to date, next due fall 2022 Pneumococcal vaccine: Up to date, next due at 75 Tdap vaccine: Currently due, if you wish to receive we recommend that your first contact your insurance company to discuss cost or await an injury to receive  Shingles vaccine: Currently due for Shingrix, if you wish to receive we recommend that you do so at your local pharmacy.     Advanced directives: Advance directive discussed with you today. Even though you declined this today please call our office should you change your mind and we can give you the proper paperwork for you to fill out.   Conditions/risks identified: None   Next appointment: None   Preventive Care 40-64 Years, Male Preventive care refers to lifestyle choices and visits with your health care provider that can promote health and wellness. What does preventive care include?  A yearly physical exam. This is also called an annual well check.  Dental exams once or twice a year.  Routine eye exams. Ask your health care provider how often you should have your eyes checked.  Personal lifestyle choices, including:  Daily care of your teeth and gums.  Regular physical activity.  Eating a healthy diet.  Avoiding tobacco and drug use.  Limiting alcohol use.  Practicing safe sex.  Taking low-dose aspirin every day starting at age 83. What happens during an annual well check? The services and screenings done by your health care provider during your  annual well check will depend on your age, overall health, lifestyle risk factors, and family history of disease. Counseling  Your health care provider may ask you questions about your:  Alcohol use.  Tobacco use.  Drug use.  Emotional well-being.  Home and relationship well-being.  Sexual activity.  Eating habits.  Work and work Statistician. Screening  You may have the following tests or measurements:  Height, weight, and BMI.  Blood pressure.  Lipid and cholesterol levels. These may be checked every 5 years, or more frequently if you are over 56 years old.  Skin check.  Lung cancer screening. You may have this screening every year starting at age 52 if you have a 30-pack-year history of smoking and currently smoke or have quit within the past 15 years.  Fecal occult blood test (FOBT) of the stool. You may have this test every year starting at age 62.  Flexible sigmoidoscopy or colonoscopy. You may have a sigmoidoscopy every 5 years or a colonoscopy every 10 years starting at age 17.  Prostate cancer screening. Recommendations will vary depending on your family history and other risks.  Hepatitis C blood test.  Hepatitis B blood test.  Sexually transmitted disease (STD) testing.  Diabetes screening. This is done by checking your blood sugar (glucose) after you have not eaten for a while (fasting). You may have this done every 1-3 years. Discuss your test results, treatment options, and if necessary, the need for more tests with your health care provider. Vaccines  Your health care provider may recommend certain vaccines, such as:  Influenza vaccine. This is recommended  every year.  Tetanus, diphtheria, and acellular pertussis (Tdap, Td) vaccine. You may need a Td booster every 10 years.  Zoster vaccine. You may need this after age 83.  Pneumococcal 13-valent conjugate (PCV13) vaccine. You may need this if you have certain conditions and have not been  vaccinated.  Pneumococcal polysaccharide (PPSV23) vaccine. You may need one or two doses if you smoke cigarettes or if you have certain conditions. Talk to your health care provider about which screenings and vaccines you need and how often you need them. This information is not intended to replace advice given to you by your health care provider. Make sure you discuss any questions you have with your health care provider. Document Released: 01/23/2015 Document Revised: 09/16/2015 Document Reviewed: 10/28/2014 Elsevier Interactive Patient Education  2017 [place] Prevention in the Home Falls can cause injuries. They can happen to people of all ages. There are many things you can do to make your home safe and to help prevent falls. What can I do on the outside of my home?  Regularly fix the edges of walkways and driveways and fix any cracks.  Remove anything that might make you trip as you walk through a door, such as a raised step or threshold.  Trim any bushes or trees on the path to your home.  Use bright outdoor lighting.  Clear any walking paths of anything that might make someone trip, such as rocks or tools.  Regularly check to see if handrails are loose or broken. Make sure that both sides of any steps have handrails.  Any raised decks and porches should have guardrails on the edges.  Have any leaves, snow, or ice cleared regularly.  Use sand or salt on walking paths during winter.  Clean up any spills in your garage right away. This includes oil or grease spills. What can I do in the bathroom?  Use night lights.  Install grab bars by the toilet and in the tub and shower. Do not use towel bars as grab bars.  Use non-skid mats or decals in the tub or shower.  If you need to sit down in the shower, use a plastic, non-slip stool.  Keep the floor dry. Clean up any water that spills on the floor as soon as it happens.  Remove soap buildup in the tub or shower  regularly.  Attach bath mats securely with double-sided non-slip rug tape.  Do not have throw rugs and other things on the floor that can make you trip. What can I do in the bedroom?  Use night lights.  Make sure that you have a light by your bed that is easy to reach.  Do not use any sheets or blankets that are too big for your bed. They should not hang down onto the floor.  Have a firm chair that has side arms. You can use this for support while you get dressed.  Do not have throw rugs and other things on the floor that can make you trip. What can I do in the kitchen?  Clean up any spills right away.  Avoid walking on wet floors.  Keep items that you use a lot in easy-to-reach places.  If you need to reach something above you, use a strong step stool that has a grab bar.  Keep electrical cords out of the way.  Do not use floor polish or wax that makes floors slippery. If you must use wax, use non-skid floor wax.  Do not have throw rugs and other things on the floor that can make you trip. What can I do with my stairs?  Do not leave any items on the stairs.  Make sure that there are handrails on both sides of the stairs and use them. Fix handrails that are broken or loose. Make sure that handrails are as long as the stairways.  Check any carpeting to make sure that it is firmly attached to the stairs. Fix any carpet that is loose or worn.  Avoid having throw rugs at the top or bottom of the stairs. If you do have throw rugs, attach them to the floor with carpet tape.  Make sure that you have a light switch at the top of the stairs and the bottom of the stairs. If you do not have them, ask someone to add them for you. What else can I do to help prevent falls?  Wear shoes that:  Do not have high heels.  Have rubber bottoms.  Are comfortable and fit you well.  Are closed at the toe. Do not wear sandals.  If you use a stepladder:  Make sure that it is fully  opened. Do not climb a closed stepladder.  Make sure that both sides of the stepladder are locked into place.  Ask someone to hold it for you, if possible.  Clearly mark and make sure that you can see:  Any grab bars or handrails.  First and last steps.  Where the edge of each step is.  Use tools that help you move around (mobility aids) if they are needed. These include:  Canes.  Walkers.  Scooters.  Crutches.  Turn on the lights when you go into a dark area. Replace any light bulbs as soon as they burn out.  Set up your furniture so you have a clear path. Avoid moving your furniture around.  If any of your floors are uneven, fix them.  If there are any pets around you, be aware of where they are.  Review your medicines with your doctor. Some medicines can make you feel dizzy. This can increase your chance of falling. Ask your doctor what other things that you can do to help prevent falls. This information is not intended to replace advice given to you by your health care provider. Make sure you discuss any questions you have with your health care provider. Document Released: 10/23/2008 Document Revised: 06/04/2015 Document Reviewed: 01/31/2014 Elsevier Interactive Patient Education  2017 [employer].

## 2020-01-21 DIAGNOSIS — D509 Iron deficiency anemia, unspecified: Secondary | ICD-10-CM | POA: Diagnosis not present

## 2020-01-21 DIAGNOSIS — N2581 Secondary hyperparathyroidism of renal origin: Secondary | ICD-10-CM | POA: Diagnosis not present

## 2020-01-21 DIAGNOSIS — E559 Vitamin D deficiency, unspecified: Secondary | ICD-10-CM | POA: Diagnosis not present

## 2020-01-21 DIAGNOSIS — N186 End stage renal disease: Secondary | ICD-10-CM | POA: Diagnosis not present

## 2020-01-21 DIAGNOSIS — Z992 Dependence on renal dialysis: Secondary | ICD-10-CM | POA: Diagnosis not present

## 2020-01-21 DIAGNOSIS — E209 Hypoparathyroidism, unspecified: Secondary | ICD-10-CM | POA: Diagnosis not present

## 2020-01-23 DIAGNOSIS — N186 End stage renal disease: Secondary | ICD-10-CM | POA: Diagnosis not present

## 2020-01-23 DIAGNOSIS — N2581 Secondary hyperparathyroidism of renal origin: Secondary | ICD-10-CM | POA: Diagnosis not present

## 2020-01-23 DIAGNOSIS — E209 Hypoparathyroidism, unspecified: Secondary | ICD-10-CM | POA: Diagnosis not present

## 2020-01-23 DIAGNOSIS — D509 Iron deficiency anemia, unspecified: Secondary | ICD-10-CM | POA: Diagnosis not present

## 2020-01-23 DIAGNOSIS — E559 Vitamin D deficiency, unspecified: Secondary | ICD-10-CM | POA: Diagnosis not present

## 2020-01-23 DIAGNOSIS — Z992 Dependence on renal dialysis: Secondary | ICD-10-CM | POA: Diagnosis not present

## 2020-01-24 ENCOUNTER — Ambulatory Visit (INDEPENDENT_AMBULATORY_CARE_PROVIDER_SITE_OTHER): Payer: Medicare Other | Admitting: Physician Assistant

## 2020-01-24 ENCOUNTER — Encounter: Payer: Self-pay | Admitting: Physician Assistant

## 2020-01-24 DIAGNOSIS — M869 Osteomyelitis, unspecified: Secondary | ICD-10-CM

## 2020-01-24 NOTE — Progress Notes (Signed)
Office Visit Note   Patient: Anthony Bullock           Date of Birth: 04/28/1960           MRN: [id number] Visit Date: 01/24/2020              Requested by: [name], MD [place],  [place] [id number] PCP: [name], MD  Chief Complaint  Patient presents with  . Left Knee - Follow-up      HPI: Patient is a pleasant 61 year old gentleman who is approximately 2-1/2 months status post left below-knee amputation.  He is doing much better.  He did have some minimal drainage we placed him on antibiotics.  He is getting ready to do a prosthetic evaluation by [place]: Visit Diagnoses: No diagnosis found.  Plan: Patient will follow-up in 3 weeks.  I am very pleased with the progress of his stump.  Asked that he discontinue the antibiotics after another week  Follow-Up Instructions: No follow-ups on file.   Ortho Exam  Patient is alert, oriented, no adenopathy, well-dressed, normal affect, normal respiratory effort. Examination of his left BKA stump he has no cellulitis he has minimal to no soft tissue swelling.  I debrided some more of the eschar to healthy pink soft skin.  On the lateral side there is 1 eschar remaining.  There is a scant amount of bloody drainage as he caught this in his shrinker this morning no cellulitis or signs of ascending infection  Imaging: No results found. No images are attached to the encounter.  Labs: Lab Results  Component Value Date   HGBA1C 6.5 (H) 11/13/2019   HGBA1C 8.1 (H) 07/10/2019   HGBA1C 8.9 (H) 08/06/2018   ESRSEDRATE 94 (H) 03/30/2016   ESRSEDRATE 18 11/30/2012   CRP 16.1 (H) 03/30/2016   REPTSTATUS 09/08/2019 FINAL 09/03/2019   GRAMSTAIN  03/31/2016    RARE WBC PRESENT, PREDOMINANTLY PMN NO ORGANISMS SEEN    CULT  09/03/2019    NO GROWTH 5 DAYS Performed at [facility], [address] [place], [place] [address]    [place] 03/31/2016   LABORGA  ENTEROBACTER SPECIES 03/31/2016     Lab Results  Component Value Date   ALBUMIN 2.8 (L) 11/28/2019   ALBUMIN 2.8 (L) 11/26/2019   ALBUMIN 2.8 (L) 11/26/2019    Lab Results  Component Value Date   MG 2.1 09/04/2019   MG 1.7 08/18/2019   MG 2.1 12/03/2014   No results found for: VD25OH  No results found for: PREALBUMIN CBC EXTENDED Latest Ref Rng & Units 11/28/2019 11/26/2019 11/23/2019  WBC 4.0 - 10.5 K/uL 8.0 7.7 5.9  RBC 4.22 - 5.81 MIL/uL 3.47(L) 3.41(L) 3.57(L)  HGB 13.0 - 17.0 g/dL 10.0(L) 9.7(L) 10.1(L)  HCT 39.0 - 52.0 % 32.1(L) 31.1(L) 32.9(L)  PLT 150 - 400 K/uL 148(L) 134(L) 165  NEUTROABS 1.7 - 7.7 K/uL - 5.1 -  LYMPHSABS 0.7 - 4.0 K/uL - 1.2 -     There is no height or weight on file to calculate BMI.  Orders:  No orders of the defined types were placed in this encounter.  No orders of the defined types were placed in this encounter.    Procedures: No procedures performed  Clinical Data: No additional findings.  ROS:  All other systems negative, except as noted in the HPI. Review of Systems  Objective: Vital Signs: There were no vitals taken for this visit.  Specialty Comments:  No specialty comments available.  PMFS History: Patient Active Problem List   Diagnosis Date Noted  . Malnutrition of moderate degree 11/18/2019  . Below-knee amputation of left lower extremity ([place]) 11/16/2019  . Abscess of left foot 11/13/2019  . Dehiscence of amputation stump ([place])   . History of complete ray amputation of fifth toe of left foot ([place]) 10/09/2019  . Acute pulmonary edema (H[facility])   . Osteomyelitis of fifth toe of left foot ([place])   . Febrile illness 08/17/2019  . SIRS (systemic inflammatory response syndrome) ([place]) 08/17/2019  . Thrombocytopenia ([place]) 08/17/2019  . Pressure injury of back, stage 2 ([place]) 08/17/2019  . Hypertensive emergency 07/10/2019  . Onychomycosis 08/16/2016  . Osteopathy in diseases classified elsewhere, unspecified site  05/14/2016  . Status post unilateral below knee amputation, right ([place]) 04/26/2016  . Bilateral carotid bruits 03/30/2016  . Diabetes mellitus with complication ([place])   . Anemia due to chronic kidney disease   . ESRD on dialysis ([place]) 11/30/2015  . Acute respiratory failure with hypoxia ([place]) 11/29/2015  . Dependence on renal dialysis ([place]) 08/15/2015  . Presence of automatic (implantable) cardiac defibrillator 03/12/2015  . At high risk for falls 09/29/2014  . Peripheral vascular disease ([place]) 09/29/2014  . Osteoarthritis of both knees 01/07/2014  . Osteoarthritis of multiple joints 10/06/2013  . NICM (nonischemic cardiomyopathy) ([place]) 06/27/2013  . CAD (coronary artery disease) 03/13/2013  . Abnormal nuclear stress test 01/27/2013  . Chronic systolic congestive heart failure ([place]) 01/27/2013  . COPD (chronic obstructive pulmonary disease) ([place]) 12/27/2012  . Diabetic peripheral neuropathy ([place]) 02/10/2012  . Urethral stricture 10/06/2010  . URINARY CALCULUS 09/28/2009  . Adjustment disorder with depressed mood 08/21/2009  . Poorly controlled type II diabetes mellitus with renal complication ([place]) 62/03/5595  . Hyperlipidemia 04/14/2008  . Essential hypertension 04/14/2008  . Allergic rhinitis 04/14/2008   Past Medical History:  Diagnosis Date  . 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 Allergic rhinitis   . Anemia   . Arthritis   . Chronic systolic heart failure ([place])    a. ECHO (12/2012) EF 25-30%, HK entireanteroseptal myocardium //  b.  EF 25%, diffuse HK, grade 1 diastolic dysfunction, MAC, mild LAE, normal RVSF, trivial pericardial effusion  . COPD (chronic obstructive pulmonary disease) ([place])   . Diabetes mellitus type II   . Diabetic nephropathy ([place])   . Diabetic neuropathy ([place])   . ESRD on hemodialysis [facility])    started HD June 2017, goes to [facility] HD unit, Dr Hinda Lenis  . History of cardiac catheterization    a.Myoview  1/15:  There is significant left ventricular dysfunction. There may be slight scar at the apex. There is no significant ischemia. LV Ejection Fraction: 27%  //  b. RHC/LHC (1/15) with mean RA 6, PA 47/22 mean 33, mean PCWP 20, PVR 2.5 WU, CI 2.5; 80% dLAD stenosis, 70% diffuse large D.   . History of kidney stones   . Hyperlipidemia   . Hypertension   . Kidney stones   . NICM (nonischemic cardiomyopathy) ([place])    Primarily nonischemic. Echo (12/14) with EF 25-30%. Echo (3/15) with EF 25%, mild to moderately dilated LV, normal RV size and systolic function.   . Osteomyelitis ([place])    left fifth ray  . Pneumonia   . Urethral stricture   . Wears glasses     Family History  Problem Relation Age of Onset  . Bladder Cancer Mother   .  Alcohol abuse Father   . Melanoma Father   . Stroke Maternal Grandmother   . Heart Problems Maternal Grandmother        unknown  . Diabetes Maternal Grandmother   . Heart disease Maternal Grandfather   . Prostate cancer Maternal Grandfather     Past Surgical History:  Procedure Laterality Date  . ABDOMINAL AORTOGRAM W/LOWER EXTREMITY N/A 03/30/2016   Procedure: Abdominal Aortogram w/Lower Extremity;  Surgeon: Angelia Mould, MD;  Location: [facility];  Service: Cardiovascular;  Laterality: N/A;  . AMPUTATION Right 04/26/2016   Procedure: Right Below Knee Amputation;  Surgeon: Newt Minion, MD;  Location: [facility];  Service: Orthopedics;  Laterality: Right;  . AMPUTATION Left 08/21/2019   Procedure: LEFT FOOT 5TH RAY AMPUTATION;  Surgeon: Newt Minion, MD;  Location: [facility];  Service: Orthopedics;  Laterality: Left;  . AMPUTATION Left 11/13/2019   Procedure: LEFT BELOW KNEE AMPUTATION;  Surgeon: Newt Minion, MD;  Location: [facility];  Service: Orthopedics;  Laterality: Left;  . AV FISTULA PLACEMENT Right 09/08/2015   Procedure: INSERTION OF 4-67m x 45cm  ARTERIOVENOUS (AV) GORE-TEX GRAFT RIGHT UPPER  ARM;  Surgeon: CAngelia Mould MD;   [place]  Service: Vascular;  Laterality: Right;  . AV FISTULA PLACEMENT Left 01/14/2016   Procedure: CREATION OF LEFT UPPER ARM ARTERIOVENOUS FISTULA;  Surgeon: CAngelia Mould MD;  [place]  Service: Vascular;  Laterality: Left;  . BASCILIC VEIN TRANSPOSITION Right 08/22/2014   Procedure: RIGHT UPPER ARM BASCILIC VEIN TRANSPOSITION;  Surgeon: CAngelia Mould MD;  [place]  Service: Vascular;  Laterality: Right;  . BELOW KNEE LEG AMPUTATION Right 04/26/2016  . CARDIAC CATHETERIZATION    . CARDIAC DEFIBRILLATOR PLACEMENT  06/27/2013   Sub Q       BY DR KCaryl Comes . CATARACT EXTRACTION W/PHACO Right 08/06/2018   Procedure: CATARACT EXTRACTION PHACO AND INTRAOCULAR LENS PLACEMENT (IOC);  Surgeon: WBaruch Goldmann MD;  [facility];  Service: Ophthalmology;  Laterality: Right;  CDE: 4.06  . CATARACT EXTRACTION W/PHACO Left 08/20/2018   Procedure: CATARACT EXTRACTION PHACO AND INTRAOCULAR LENS PLACEMENT (IOC);  Surgeon: WBaruch Goldmann MD;  [facility];  Service: Ophthalmology;  Laterality: Left;  CDE: 6.76  . COLONOSCOPY WITH PROPOFOL N/A 07/22/2015   Procedure: COLONOSCOPY WITH PROPOFOL;  Surgeon: HDoran Stabler MD;  [facility];  Service: Gastroenterology;  Laterality: N/A;  . FEMORAL-POPLITEAL BYPASS GRAFT Right 03/31/2016   Procedure: BYPASS GRAFT FEMORAL-POPLITEAL ARTERY USING RIGHT GREATER SAPHENOUS NONREVERSED VEIN;  Surgeon: CAngelia Mould MD;  [place] [facility]  Service: Vascular;  Laterality: Right;  . HERNIA REPAIR    . I & D EXTREMITY Right 03/31/2016   Procedure: IRRIGATION AND DEBRIDEMENT FOOT;  Surgeon: CAngelia Mould MD;  [place] [facility]  Service: Vascular;  Laterality: Right;  . IMPLANTABLE CARDIOVERTER DEFIBRILLATOR IMPLANT N/A 06/27/2013   Procedure: SUB Q ICD;  Surgeon: SDeboraha Sprang MD;  [facility];  Service: Cardiovascular;  Laterality: N/A;  . INTRAOPERATIVE ARTERIOGRAM Right 03/31/2016   Procedure:  INTRA OPERATIVE ARTERIOGRAM;  Surgeon: CAngelia Mould MD;  [place] [facility]  Service: Vascular;  Laterality: Right;  . IR GENERIC HISTORICAL Right 11/30/2015   IR THROMBECTOMY AV FISTULA W/THROMBOLYSIS/PTA INC/SHUNT/IMG RIGHT 11/30/2015 GAletta Edouard MD [facility]  . IR GENERIC HISTORICAL  11/30/2015   IR [place] ACCESS RIGHT 11/30/2015 GAletta Edouard MD [facility]  . IR GENERIC HISTORICAL  Right 12/15/2015   IR THROMBECTOMY AV FISTULA W/THROMBOLYSIS/PTA/STENT INC/SHUNT/IMG RT 12/15/2015 Arne Cleveland, MD [facility]  . IR GENERIC HISTORICAL  12/15/2015   IR US GUIDE VASC ACCESS RIGHT 12/15/2015 Arne Cleveland, MD [facility]  . IR GENERIC HISTORICAL  12/28/2015   IR FLUORO GUIDE CV LINE RIGHT 12/28/2015 Marybelle Killings, MD [facility]  . IR GENERIC HISTORICAL  12/28/2015   IR US GUIDE VASC ACCESS RIGHT 12/28/2015 Marybelle Killings, MD [facility]  . LEFT A ND RIGHT HEART CATH  01/30/2013   DR Sung Amabile  . LEFT AND RIGHT HEART CATHETERIZATION WITH CORONARY ANGIOGRAM N/A 01/30/2013   Procedure: LEFT AND RIGHT HEART CATHETERIZATION WITH CORONARY ANGIOGRAM;  Surgeon: Jolaine Artist, MD;  Location[facility];  Service: Cardiovascular;  Laterality: N/A;  . PERIPHERAL VASCULAR CATHETERIZATION Right 01/26/2015   Procedure: A/V Fistulagram;  Surgeon: Angelia Mould, MD;  Location: [facility];  Service: Cardiovascular;  Laterality: Right;  . reapea urethral surgery for recurrent obstruction  2011  . TOTAL KNEE ARTHROPLASTY Right 2007  . VEIN HARVEST Right 03/31/2016   Procedure: RIGHT GREATER SAPHENOUS VEIN HARVEST;  Surgeon: Angelia Mould, MD;  Location: [facility];  Service: Vascular;  Laterality: Right;   Social History   Occupational History  . Not on file  Tobacco Use  . Smoking status: Former Smoker    Packs/day: 2.00    Years: 32.00    Pack years: 64.00    Types: Cigarettes    Quit date: 05/11/2010    Years since quitting: 9.7  . Smokeless tobacco:  Never Used  Vaping Use  . Vaping Use: Never used  Substance and Sexual Activity  . Alcohol use: No  . Drug use: No  . Sexual activity: Yes

## 2020-01-25 DIAGNOSIS — Z992 Dependence on renal dialysis: Secondary | ICD-10-CM | POA: Diagnosis not present

## 2020-01-25 DIAGNOSIS — E559 Vitamin D deficiency, unspecified: Secondary | ICD-10-CM | POA: Diagnosis not present

## 2020-01-25 DIAGNOSIS — E209 Hypoparathyroidism, unspecified: Secondary | ICD-10-CM | POA: Diagnosis not present

## 2020-01-25 DIAGNOSIS — D509 Iron deficiency anemia, unspecified: Secondary | ICD-10-CM | POA: Diagnosis not present

## 2020-01-25 DIAGNOSIS — N186 End stage renal disease: Secondary | ICD-10-CM | POA: Diagnosis not present

## 2020-01-25 DIAGNOSIS — N2581 Secondary hyperparathyroidism of renal origin: Secondary | ICD-10-CM | POA: Diagnosis not present

## 2020-01-28 DIAGNOSIS — D509 Iron deficiency anemia, unspecified: Secondary | ICD-10-CM | POA: Diagnosis not present

## 2020-01-28 DIAGNOSIS — E559 Vitamin D deficiency, unspecified: Secondary | ICD-10-CM | POA: Diagnosis not present

## 2020-01-28 DIAGNOSIS — N186 End stage renal disease: Secondary | ICD-10-CM | POA: Diagnosis not present

## 2020-01-28 DIAGNOSIS — N2581 Secondary hyperparathyroidism of renal origin: Secondary | ICD-10-CM | POA: Diagnosis not present

## 2020-01-28 DIAGNOSIS — E209 Hypoparathyroidism, unspecified: Secondary | ICD-10-CM | POA: Diagnosis not present

## 2020-01-28 DIAGNOSIS — Z992 Dependence on renal dialysis: Secondary | ICD-10-CM | POA: Diagnosis not present

## 2020-01-30 DIAGNOSIS — D509 Iron deficiency anemia, unspecified: Secondary | ICD-10-CM | POA: Diagnosis not present

## 2020-01-30 DIAGNOSIS — E559 Vitamin D deficiency, unspecified: Secondary | ICD-10-CM | POA: Diagnosis not present

## 2020-01-30 DIAGNOSIS — N186 End stage renal disease: Secondary | ICD-10-CM | POA: Diagnosis not present

## 2020-01-30 DIAGNOSIS — Z992 Dependence on renal dialysis: Secondary | ICD-10-CM | POA: Diagnosis not present

## 2020-01-30 DIAGNOSIS — N2581 Secondary hyperparathyroidism of renal origin: Secondary | ICD-10-CM | POA: Diagnosis not present

## 2020-01-30 DIAGNOSIS — E209 Hypoparathyroidism, unspecified: Secondary | ICD-10-CM | POA: Diagnosis not present

## 2020-02-01 DIAGNOSIS — N186 End stage renal disease: Secondary | ICD-10-CM | POA: Diagnosis not present

## 2020-02-01 DIAGNOSIS — Z992 Dependence on renal dialysis: Secondary | ICD-10-CM | POA: Diagnosis not present

## 2020-02-01 DIAGNOSIS — N2581 Secondary hyperparathyroidism of renal origin: Secondary | ICD-10-CM | POA: Diagnosis not present

## 2020-02-01 DIAGNOSIS — E209 Hypoparathyroidism, unspecified: Secondary | ICD-10-CM | POA: Diagnosis not present

## 2020-02-01 DIAGNOSIS — D509 Iron deficiency anemia, unspecified: Secondary | ICD-10-CM | POA: Diagnosis not present

## 2020-02-01 DIAGNOSIS — E559 Vitamin D deficiency, unspecified: Secondary | ICD-10-CM | POA: Diagnosis not present

## 2020-02-04 DIAGNOSIS — E559 Vitamin D deficiency, unspecified: Secondary | ICD-10-CM | POA: Diagnosis not present

## 2020-02-04 DIAGNOSIS — E209 Hypoparathyroidism, unspecified: Secondary | ICD-10-CM | POA: Diagnosis not present

## 2020-02-04 DIAGNOSIS — N2581 Secondary hyperparathyroidism of renal origin: Secondary | ICD-10-CM | POA: Diagnosis not present

## 2020-02-04 DIAGNOSIS — Z992 Dependence on renal dialysis: Secondary | ICD-10-CM | POA: Diagnosis not present

## 2020-02-04 DIAGNOSIS — N186 End stage renal disease: Secondary | ICD-10-CM | POA: Diagnosis not present

## 2020-02-04 DIAGNOSIS — D509 Iron deficiency anemia, unspecified: Secondary | ICD-10-CM | POA: Diagnosis not present

## 2020-02-06 DIAGNOSIS — E559 Vitamin D deficiency, unspecified: Secondary | ICD-10-CM | POA: Diagnosis not present

## 2020-02-06 DIAGNOSIS — Z992 Dependence on renal dialysis: Secondary | ICD-10-CM | POA: Diagnosis not present

## 2020-02-06 DIAGNOSIS — E209 Hypoparathyroidism, unspecified: Secondary | ICD-10-CM | POA: Diagnosis not present

## 2020-02-06 DIAGNOSIS — D509 Iron deficiency anemia, unspecified: Secondary | ICD-10-CM | POA: Diagnosis not present

## 2020-02-06 DIAGNOSIS — N186 End stage renal disease: Secondary | ICD-10-CM | POA: Diagnosis not present

## 2020-02-06 DIAGNOSIS — N2581 Secondary hyperparathyroidism of renal origin: Secondary | ICD-10-CM | POA: Diagnosis not present

## 2020-02-07 DIAGNOSIS — N186 End stage renal disease: Secondary | ICD-10-CM | POA: Diagnosis not present

## 2020-02-07 DIAGNOSIS — E209 Hypoparathyroidism, unspecified: Secondary | ICD-10-CM | POA: Diagnosis not present

## 2020-02-07 DIAGNOSIS — Z992 Dependence on renal dialysis: Secondary | ICD-10-CM | POA: Diagnosis not present

## 2020-02-07 DIAGNOSIS — E559 Vitamin D deficiency, unspecified: Secondary | ICD-10-CM | POA: Diagnosis not present

## 2020-02-07 DIAGNOSIS — N2581 Secondary hyperparathyroidism of renal origin: Secondary | ICD-10-CM | POA: Diagnosis not present

## 2020-02-07 DIAGNOSIS — D509 Iron deficiency anemia, unspecified: Secondary | ICD-10-CM | POA: Diagnosis not present

## 2020-02-10 DIAGNOSIS — N186 End stage renal disease: Secondary | ICD-10-CM | POA: Diagnosis not present

## 2020-02-10 DIAGNOSIS — Z992 Dependence on renal dialysis: Secondary | ICD-10-CM | POA: Diagnosis not present

## 2020-02-11 DIAGNOSIS — D631 Anemia in chronic kidney disease: Secondary | ICD-10-CM | POA: Diagnosis not present

## 2020-02-11 DIAGNOSIS — N2581 Secondary hyperparathyroidism of renal origin: Secondary | ICD-10-CM | POA: Diagnosis not present

## 2020-02-11 DIAGNOSIS — N186 End stage renal disease: Secondary | ICD-10-CM | POA: Diagnosis not present

## 2020-02-11 DIAGNOSIS — E209 Hypoparathyroidism, unspecified: Secondary | ICD-10-CM | POA: Diagnosis not present

## 2020-02-11 DIAGNOSIS — D509 Iron deficiency anemia, unspecified: Secondary | ICD-10-CM | POA: Diagnosis not present

## 2020-02-11 DIAGNOSIS — Z992 Dependence on renal dialysis: Secondary | ICD-10-CM | POA: Diagnosis not present

## 2020-02-12 ENCOUNTER — Ambulatory Visit: Payer: Medicare Other | Admitting: Cardiology

## 2020-02-12 ENCOUNTER — Telehealth: Payer: Self-pay | Admitting: Cardiology

## 2020-02-12 NOTE — Telephone Encounter (Signed)
error 

## 2020-02-13 DIAGNOSIS — D509 Iron deficiency anemia, unspecified: Secondary | ICD-10-CM | POA: Diagnosis not present

## 2020-02-13 DIAGNOSIS — N186 End stage renal disease: Secondary | ICD-10-CM | POA: Diagnosis not present

## 2020-02-13 DIAGNOSIS — Z992 Dependence on renal dialysis: Secondary | ICD-10-CM | POA: Diagnosis not present

## 2020-02-13 DIAGNOSIS — N2581 Secondary hyperparathyroidism of renal origin: Secondary | ICD-10-CM | POA: Diagnosis not present

## 2020-02-13 DIAGNOSIS — E209 Hypoparathyroidism, unspecified: Secondary | ICD-10-CM | POA: Diagnosis not present

## 2020-02-13 DIAGNOSIS — D631 Anemia in chronic kidney disease: Secondary | ICD-10-CM | POA: Diagnosis not present

## 2020-02-14 ENCOUNTER — Encounter: Payer: Self-pay | Admitting: Cardiology

## 2020-02-14 ENCOUNTER — Ambulatory Visit (INDEPENDENT_AMBULATORY_CARE_PROVIDER_SITE_OTHER): Payer: Medicare Other | Admitting: Cardiology

## 2020-02-14 ENCOUNTER — Ambulatory Visit: Payer: Medicare Other | Admitting: Physician Assistant

## 2020-02-14 ENCOUNTER — Other Ambulatory Visit: Payer: Self-pay

## 2020-02-14 VITALS — BP 112/62 | HR 70 | Wt 175.0 lb

## 2020-02-14 DIAGNOSIS — I5022 Chronic systolic (congestive) heart failure: Secondary | ICD-10-CM

## 2020-02-14 DIAGNOSIS — I2510 Atherosclerotic heart disease of native coronary artery without angina pectoris: Secondary | ICD-10-CM | POA: Diagnosis not present

## 2020-02-14 DIAGNOSIS — I10 Essential (primary) hypertension: Secondary | ICD-10-CM | POA: Diagnosis not present

## 2020-02-14 NOTE — Progress Notes (Signed)
Clinical Summary Anthony Bullock is a 60 y.o.male seen today for follow up of the following medical problems.    1. Chronic systolic HF/NICM - echo in 2015 LVEF 25%. Most recent echo 08/2015 LVEF has improved to 50-55%. - has had some of his CHF meds decreased due to low bp's on HD.  - has ICD  08/2016 echo LVEF 55%. , grade I diasotlic function.   06/2019 [facility] ER visit. +cough and SOB - by EMS sats 70%, bp 200/100 - placed on cpap - transferred to [facility], treated for pneumonia and pulm edema, had extra HD sessions - no recurrent symptoms since discharge.  - from f/u notes appears dry weight at HD has been decreasd to 92.5 kg  -no recent edema. No SOB or DOE.    2. ESRD -low bp's has been a progressing issue on HD   3. HTN - can have occasional low bp's during HD - compliant with meds  - does not take coreg or norvasc prior to HD or night before.   4. CAD - cath 2015 with diffuse nonobstructive disease, worst lesion 80% small distal LAD.   - no recent chest pains   5. Hyperlipidemia -Jan 2019 TC 103 TG 136 HDL 38 LDL 38 04/2019 TG 187 HDL 37 LDL 84    6. DM2 - 06/2019 HgbA1c 8.1 - followed by pcp  7. Carotid stenosis - followed by vascular. - in 2019 mild bilateral disease     Past Medical History:  Diagnosis Date  . 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 Allergic rhinitis   . Anemia   . Arthritis   . Chronic systolic heart failure ([place])    a. ECHO (12/2012) EF 25-30%, HK entireanteroseptal myocardium //  b.  EF 25%, diffuse HK, grade 1 diastolic dysfunction, MAC, mild LAE, normal RVSF, trivial pericardial effusion  . COPD (chronic obstructive pulmonary disease) ([place])   . Diabetes mellitus type II   . Diabetic nephropathy ([place])   . Diabetic neuropathy ([place])   . ESRD on hemodialysis [facility])    started HD June 2017, goes to [facility] HD unit, Dr Hinda Lenis  . History of cardiac  catheterization    a.Myoview 1/15:  There is significant left ventricular dysfunction. There may be slight scar at the apex. There is no significant ischemia. LV Ejection Fraction: 27%  //  b. RHC/LHC (1/15) with mean RA 6, PA 47/22 mean 33, mean PCWP 20, PVR 2.5 WU, CI 2.5; 80% dLAD stenosis, 70% diffuse large D.   . History of kidney stones   . Hyperlipidemia   . Hypertension   . Kidney stones   . NICM (nonischemic cardiomyopathy) ([place])    Primarily nonischemic. Echo (12/14) with EF 25-30%. Echo (3/15) with EF 25%, mild to moderately dilated LV, normal RV size and systolic function.   . Osteomyelitis ([place])    left fifth ray  . Pneumonia   . Urethral stricture   . Wears glasses      Allergies  Allergen Reactions  . Epoetin Alfa Other (See Comments)    unknown  . Ferumoxytol Other (See Comments)    unknown  . Morphine Sulfate Rash and Other (See Comments)    Itches all over, red spots     Current Outpatient Medications  Medication Sig Dispense Refill  . acetaminophen (TYLENOL) 325 MG tablet Take 1-2 tablets (325-650 mg total) by mouth every 4 (four) hours as needed for mild pain.    [name]  albuterol (VENTOLIN HFA) 108 (90 Base) MCG/ACT inhaler Inhale 2 puffs into the lungs every 4 (four) hours as needed for wheezing or shortness of breath. 1 each 1  . amLODipine (NORVASC) 10 MG tablet Take 0.5 tablets (5 mg total) by mouth daily. 15 tablet 0  . aspirin EC 81 MG tablet Take 81 mg by mouth daily.     [name] atorvastatin (LIPITOR) 80 MG tablet Take 1 tablet (80 mg total) by mouth at bedtime. 90 tablet 3  . azelastine (ASTELIN) 0.1 % nasal spray Place 2 sprays into both nostrils 3 (three) times daily as needed for rhinitis. Use in each nostril as directed 30 mL 12  . carvedilol (COREG) 25 MG tablet Take 1 tablet (25 mg total) by mouth 2 (two) times daily. 60 tablet 0  . cinacalcet (SENSIPAR) 30 MG tablet Take 1 tablet (30 mg total) by mouth Every Tuesday,Thursday,and Saturday with dialysis.  60 tablet   . Continuous Blood Gluc Sensor (FREESTYLE LIBRE 14 DAY SENSOR) MISC USE AS DIRECTED EVERY 14 DAYS 4 each 11  . doxercalciferol (HECTOROL) 4 MCG/2ML injection Inject 1.25 mLs (2.5 mcg total) into the vein Every Tuesday,Thursday,and Saturday with dialysis. 2 mL   . doxycycline (VIBRA-TABS) 100 MG tablet Take 1 tablet (100 mg total) by mouth 2 (two) times daily. 60 tablet 0  . famotidine (PEPCID) 10 MG tablet Take 1 tablet (10 mg total) by mouth at bedtime. (Patient not taking: Reported on 01/20/2020) 30 tablet 0  . fenofibrate 160 MG tablet Take 1 tablet (160 mg total) by mouth daily. 90 tablet 3  . fexofenadine (ALLEGRA) 180 MG tablet Take 180 mg by mouth daily.    . furosemide (LASIX) 40 MG tablet Take 3 tablets by mouth once daily (Patient taking differently: Take 120 mg by mouth daily.) 270 tablet 0  . gabapentin (NEURONTIN) 100 MG capsule Take one pill in morning and two pills at bedtime. 90 capsule 0  . glucose blood test strip Check 1 time daily. E11.9 One Touch Ultra Blue Test Strips 100 each 3  . Insulin Glargine (BASAGLAR KWIKPEN) 100 UNIT/ML INJECT 12 UNITS SUBCUTANEOUSLY AT BEDTIME 15 mL 11  . Insulin Lispro (HUMALOG KWIKPEN) 200 UNIT/ML SOPN Inject 10 Units into the skin 3 (three) times daily with meals. Use tid with meals as per sliding scale (Patient taking differently: Inject 2-14 Units into the skin 3 (three) times daily with meals. sliding scale) 4 pen 6  . Insulin Pen Needle (BD PEN NEEDLE NANO U/F) 32G X 4 MM MISC USE 1 PEN NEEDLE SUBCUTANEOUSLY WITH INSULIN 4 TIMES DAILY 400 each 0  . lanthanum (FOSRENOL) 500 MG chewable tablet Chew 500-1,000 mg by mouth See admin instructions. Take 2000 mg with meals three time a day and 500 mg with snacks    . multivitamin (RENA-VIT) TABS tablet Take 1 tablet by mouth daily. 90 tablet 3  . Nutritional Supplements (,FEEDING SUPPLEMENT, PROSOURCE PLUS) liquid Take 30 mLs by mouth 2 (two) times daily between meals. 887 mL 0  . Olopatadine  HCl 0.2 % SOLN Place 1 drop into both eyes daily as needed (for allergies).     . ondansetron (ZOFRAN) 4 MG tablet Take 1 tablet (4 mg total) by mouth every 6 (six) hours as needed for nausea. (Patient not taking: Reported on 01/20/2020) 20 tablet 0  . senna-docusate (SENOKOT-S) 8.6-50 MG tablet Take 2 tablets by mouth daily after supper. (Patient not taking: Reported on 01/20/2020) 60 tablet 0   No current facility-administered  medications for this visit.     Past Surgical History:  Procedure Laterality Date  . ABDOMINAL AORTOGRAM W/LOWER EXTREMITY N/A 03/30/2016   Procedure: Abdominal Aortogram w/Lower Extremity;  Surgeon: Angelia Mould, MD;  Location: [place] [facility];  Service: Cardiovascular;  Laterality: N/A;  . AMPUTATION Right 04/26/2016   Procedure: Right Below Knee Amputation;  Surgeon: Newt Minion, MD;  Location: [place];  Service: Orthopedics;  Laterality: Right;  . AMPUTATION Left 08/21/2019   Procedure: LEFT FOOT 5TH RAY AMPUTATION;  Surgeon: Newt Minion, MD;  Location: [place];  Service: Orthopedics;  Laterality: Left;  . AMPUTATION Left 11/13/2019   Procedure: LEFT BELOW KNEE AMPUTATION;  Surgeon: Newt Minion, MD;  Location: [place];  Service: Orthopedics;  Laterality: Left;  . AV FISTULA PLACEMENT Right 09/08/2015   Procedure: INSERTION OF 4-32m x 45cm  ARTERIOVENOUS (AV) GORE-TEX GRAFT RIGHT UPPER  ARM;  Surgeon: CAngelia Mould MD;  [place]  Service: Vascular;  Laterality: Right;  . AV FISTULA PLACEMENT Left 01/14/2016   Procedure: CREATION OF LEFT UPPER ARM ARTERIOVENOUS FISTULA;  Surgeon: CAngelia Mould MD;  [place]  Service: Vascular;  Laterality: Left;  . BASCILIC VEIN TRANSPOSITION Right 08/22/2014   Procedure: RIGHT UPPER ARM BASCILIC VEIN TRANSPOSITION;  Surgeon: CAngelia Mould MD;  [place]  Service: Vascular;  Laterality: Right;  . BELOW KNEE LEG AMPUTATION Right 04/26/2016  . CARDIAC CATHETERIZATION    . CARDIAC  DEFIBRILLATOR PLACEMENT  06/27/2013   Sub Q       BY DR KCaryl Comes . CATARACT EXTRACTION W/PHACO Right 08/06/2018   Procedure: CATARACT EXTRACTION PHACO AND INTRAOCULAR LENS PLACEMENT (IOC);  Surgeon: WBaruch Goldmann MD;  Location: [facility];  Service: Ophthalmology;  Laterality: Right;  CDE: 4.06  . CATARACT EXTRACTION W/PHACO Left 08/20/2018   Procedure: CATARACT EXTRACTION PHACO AND INTRAOCULAR LENS PLACEMENT (IOC);  Surgeon: WBaruch Goldmann MD;  Location: [facility];  Service: Ophthalmology;  Laterality: Left;  CDE: 6.76  . COLONOSCOPY WITH PROPOFOL N/A 07/22/2015   Procedure: COLONOSCOPY WITH PROPOFOL;  Surgeon: HDoran Stabler MD;  [facility];  Service: Gastroenterology;  Laterality: N/A;  . FEMORAL-POPLITEAL BYPASS GRAFT Right 03/31/2016   Procedure: BYPASS GRAFT FEMORAL-POPLITEAL ARTERY USING RIGHT GREATER SAPHENOUS NONREVERSED VEIN;  Surgeon: CAngelia Mould MD;  [place]  Service: Vascular;  Laterality: Right;  . HERNIA REPAIR    . I & D EXTREMITY Right 03/31/2016   Procedure: IRRIGATION AND DEBRIDEMENT FOOT;  Surgeon: CAngelia Mould MD;  [place]  Service: Vascular;  Laterality: Right;  . IMPLANTABLE CARDIOVERTER DEFIBRILLATOR IMPLANT N/A 06/27/2013   Procedure: SUB Q ICD;  Surgeon: SDeboraha Sprang MD;  [facility];  Service: Cardiovascular;  Laterality: N/A;  . INTRAOPERATIVE ARTERIOGRAM Right 03/31/2016   Procedure: INTRA OPERATIVE ARTERIOGRAM;  Surgeon: CAngelia Mould MD;  [place]  Service: Vascular;  Laterality: Right;  . IR GENERIC HISTORICAL Right 11/30/2015   IR THROMBECTOMY AV FISTULA W/THROMBOLYSIS/PTA INC/SHUNT/IMG RIGHT 11/30/2015 GAletta Edouard MD [facility]  . IR GENERIC HISTORICAL  11/30/2015   IR [place] ACCESS RIGHT 11/30/2015 GAletta Edouard MD [facility]  . IR GENERIC HISTORICAL Right 12/15/2015   IR THROMBECTOMY AV FISTULA W/THROMBOLYSIS/PTA/STENT INC/SHUNT/IMG RT 12/15/2015 DArne Cleveland MD  [facility]  . IR GENERIC HISTORICAL  12/15/2015   IR [place] VASC ACCESS RIGHT 12/15/2015 DArne Cleveland MD [facility]  . IR GENERIC HISTORICAL  12/28/2015   IR FLUORO GUIDE CV LINE RIGHT 12/28/2015 Marybelle Killings, MD [facility]  . IR GENERIC HISTORICAL  12/28/2015   IR US GUIDE VASC ACCESS RIGHT 12/28/2015 Marybelle Killings, MD [facility]  . LEFT A ND RIGHT HEART CATH  01/30/2013   DR Sung Amabile  . LEFT AND RIGHT HEART CATHETERIZATION WITH CORONARY ANGIOGRAM N/A 01/30/2013   Procedure: LEFT AND RIGHT HEART CATHETERIZATION WITH CORONARY ANGIOGRAM;  Surgeon: Jolaine Artist, MD;  Location: [facility];  Service: Cardiovascular;  Laterality: N/A;  . PERIPHERAL VASCULAR CATHETERIZATION Right 01/26/2015   Procedure: A/V Fistulagram;  Surgeon: Angelia Mould, MD;  Location: [place] [facility];  Service: Cardiovascular;  Laterality: Right;  . reapea urethral surgery for recurrent obstruction  2011  . TOTAL KNEE ARTHROPLASTY Right 2007  . VEIN HARVEST Right 03/31/2016   Procedure: RIGHT GREATER SAPHENOUS VEIN HARVEST;  Surgeon: Angelia Mould, MD;  Location: [place];  Service: Vascular;  Laterality: Right;     Allergies  Allergen Reactions  . Epoetin Alfa Other (See Comments)    unknown  . Ferumoxytol Other (See Comments)    unknown  . Morphine Sulfate Rash and Other (See Comments)    Itches all over, red spots      Family History  Problem Relation Age of Onset  . Bladder Cancer Mother   . Alcohol abuse Father   . Melanoma Father   . Stroke Maternal Grandmother   . Heart Problems Maternal Grandmother        unknown  . Diabetes Maternal Grandmother   . Heart disease Maternal Grandfather   . Prostate cancer Maternal Grandfather      Social History Mr. Kelty reports that he quit smoking about 9 years ago. His smoking use included cigarettes. He has a 64.00 pack-year smoking history. He has never used smokeless tobacco. Mr. Bianchini reports no history of alcohol  use.   Review of Systems CONSTITUTIONAL: No weight loss, fever, chills, weakness or fatigue.  HEENT: Eyes: No visual loss, blurred vision, double vision or yellow sclerae.No hearing loss, sneezing, congestion, runny nose or sore throat.  SKIN: No rash or itching.  CARDIOVASCULAR: per hpi RESPIRATORY: No shortness of breath, cough or sputum.  GASTROINTESTINAL: No anorexia, nausea, vomiting or diarrhea. No abdominal pain or blood.  GENITOURINARY: No burning on urination, no polyuria NEUROLOGICAL: No headache, dizziness, syncope, paralysis, ataxia, numbness or tingling in the extremities. No change in bowel or bladder control.  MUSCULOSKELETAL: No muscle, back pain, joint pain or stiffness.  LYMPHATICS: No enlarged nodes. No history of splenectomy.  PSYCHIATRIC: No history of depression or anxiety.  ENDOCRINOLOGIC: No reports of sweating, cold or heat intolerance. No polyuria or polydipsia.  [name]   Physical Examination Today's Vitals   02/14/20 1018  BP: 112/62  Pulse: 70  Weight: 175 lb (79.4 kg)   Body mass index is 25.11 kg/m.  Gen: resting comfortably, no acute distress HEENT: no scleral icterus, pupils equal round and reactive, no palptable cervical adenopathy,  CV: RRR, no m/r/g, no jvd Resp: Clear to auscultation bilaterally GI: abdomen is soft, non-tender, non-distended, normal bowel sounds, no hepatosplenomegaly MSK: extremities are warm, no edema.  Skin: warm, no rash Neuro:  no focal deficits Psych: appropriate affect   Diagnostic Studies  08/2015 echo Study Conclusions  - Left ventricle: The cavity size was normal. Systolic function was normal. The estimated ejection fraction was in the range of 50% to 55%. Wall motion was normal; there were no regional wall  motion abnormalities. Doppler parameters are consistent with abnormal left ventricular relaxation (grade 1 diastolic dysfunction). - Left atrium: The atrium was mildly dilated. - Atrial septum:  No defect or patent foramen ovale was identified.  Jan 2015 cath Findings:  RA = 6 RV = 48/5/7 PA = 47/22 (33) PCW = 20 Fick cardiac output/index = 5.2/2.5 PVR = 2.5 WU SVR = 1192 FA sat = 98% PA sat = 65%, 68%  Ao Pressure: 116/64 (84) LV Pressure: 122/13/18 There was no signficant gradient across the aortic valve on pullback.  Left main: Mild ostial tapering otherwise ok  LAD: Narrow vessel (due to diffuse diabetic vasculopathy). 30% ostial. 40-50% tubular lesion in the mid section. 80% lesion distally. Large diagonal with moderate diffuse disease and 70% lesion in midsection  LCX: Non dominant vessel with diffuse diabetic vasculopathy. Large OM-1. Small OM-2 and OM-3. 2 PLs. 30% mid AV groove CX lesion. 40% lesion in proximal OM-1  RCA: Large dominant vessel with just mild plaque  Assessment: 1. CAD with diffuse diabetic vasculopathy. Only high grade lesion is in small distal LAD 2. Well-compensated hemodynamics   Assessment and Plan  1. Chronic systolic HF - LVEF has now normalized - medical therapy has been limited due to low bp's during HD, as well as dizziness - no recent symptoms, continue current meds  2. HTN - issues with low bp's during HD - will d/c norvac, continue coreg for now - could consider midodrine morning prior to HD if issues continue  3. CAD - nonobstructive diseaseby prior cath.  - no recent symptoms       Arnoldo Lenis, M.D.

## 2020-02-14 NOTE — Patient Instructions (Signed)
Medication Instructions:  STOP Amlodipine    *If you need a refill on your cardiac medications before your next appointment, please call your pharmacy*   Lab Work: None today If you have labs (blood work) drawn today and your tests are completely normal, you will receive your results only by: [name] MyChart Message (if you have MyChart) OR . A paper copy in the mail If you have any lab test that is abnormal or we need to change your treatment, we will call you to review the results.   Testing/Procedures: None today   Follow-Up: At [facility], you and your health needs are our priority.  As part of our continuing mission to provide you with exceptional heart care, we have created designated Provider Care Teams.  These Care Teams include your primary Cardiologist (physician) and Advanced Practice Providers (APPs -  Physician Assistants and Nurse Practitioners) who all work together to provide you with the care you need, when you need it.  We recommend signing up for the patient portal called "MyChart".  Sign up information is provided on this After Visit Summary.  MyChart is used to connect with patients for Virtual Visits (Telemedicine).  Patients are able to view lab/test results, encounter notes, upcoming appointments, etc.  Non-urgent messages can be sent to your provider as well.   To learn more about what you can do with MyChart, go to [email or web address].    Your next appointment:   6 month(s)  The format for your next appointment:   In Person  Provider:   Carlyle Dolly, MD    Other Instructions None      Thank you for choosing [place] !

## 2020-02-15 DIAGNOSIS — E209 Hypoparathyroidism, unspecified: Secondary | ICD-10-CM | POA: Diagnosis not present

## 2020-02-15 DIAGNOSIS — N2581 Secondary hyperparathyroidism of renal origin: Secondary | ICD-10-CM | POA: Diagnosis not present

## 2020-02-15 DIAGNOSIS — N186 End stage renal disease: Secondary | ICD-10-CM | POA: Diagnosis not present

## 2020-02-15 DIAGNOSIS — D631 Anemia in chronic kidney disease: Secondary | ICD-10-CM | POA: Diagnosis not present

## 2020-02-15 DIAGNOSIS — Z992 Dependence on renal dialysis: Secondary | ICD-10-CM | POA: Diagnosis not present

## 2020-02-15 DIAGNOSIS — D509 Iron deficiency anemia, unspecified: Secondary | ICD-10-CM | POA: Diagnosis not present

## 2020-02-18 ENCOUNTER — Ambulatory Visit (INDEPENDENT_AMBULATORY_CARE_PROVIDER_SITE_OTHER): Payer: Medicare Other | Admitting: Physician Assistant

## 2020-02-18 ENCOUNTER — Encounter: Payer: Self-pay | Admitting: Physician Assistant

## 2020-02-18 DIAGNOSIS — T8781 Dehiscence of amputation stump: Secondary | ICD-10-CM

## 2020-02-18 DIAGNOSIS — N186 End stage renal disease: Secondary | ICD-10-CM | POA: Diagnosis not present

## 2020-02-18 DIAGNOSIS — D509 Iron deficiency anemia, unspecified: Secondary | ICD-10-CM | POA: Diagnosis not present

## 2020-02-18 DIAGNOSIS — Z992 Dependence on renal dialysis: Secondary | ICD-10-CM | POA: Diagnosis not present

## 2020-02-18 DIAGNOSIS — E209 Hypoparathyroidism, unspecified: Secondary | ICD-10-CM | POA: Diagnosis not present

## 2020-02-18 DIAGNOSIS — D631 Anemia in chronic kidney disease: Secondary | ICD-10-CM | POA: Diagnosis not present

## 2020-02-18 DIAGNOSIS — N2581 Secondary hyperparathyroidism of renal origin: Secondary | ICD-10-CM | POA: Diagnosis not present

## 2020-02-18 NOTE — Progress Notes (Signed)
Office Visit Note   Patient: Anthony Bullock           Date of Birth: March 22, 1960           MRN: [id number] Visit Date: 02/18/2020              Requested by: [name], MD [place],  [place] [address] PCP: [name], MD  Chief Complaint  Patient presents with  . Left Knee - Follow-up      HPI: Patient presents today 3 months status post left below-knee amputation he is doing well. He just has one small eschar that has debrided with a sock and still has slight amount of drainage. He does feel he is slowly improving.  Assessment & Plan: Visit Diagnoses: No diagnosis found.  Plan: Continue with sock and daily cleansing follow-up for 3 weeks. I am hopeful that he will soon be able to be fit for his prosthetic  Follow-Up Instructions: No follow-ups on file.   Ortho Exam  Patient is alert, oriented, no adenopathy, well-dressed, normal affect, normal respiratory effort. Examination overall well-healed surgical incision. He just has some debrided eschar on the very side that does not probe deeply it is not a dehiscence there is no foul odor no surrounding cellulitis or erythema. No signs of infection swelling is very well controlled  Imaging: No results found. No images are attached to the encounter.  Labs: Lab Results  Component Value Date   HGBA1C 6.5 (H) 11/13/2019   HGBA1C 8.1 (H) 07/10/2019   HGBA1C 8.9 (H) 08/06/2018   ESRSEDRATE 94 (H) 03/30/2016   ESRSEDRATE 18 11/30/2012   CRP 16.1 (H) 03/30/2016   REPTSTATUS 09/08/2019 FINAL 09/03/2019   GRAMSTAIN  03/31/2016    RARE WBC PRESENT, PREDOMINANTLY PMN NO ORGANISMS SEEN    CULT  09/03/2019    NO GROWTH 5 DAYS Performed at [facility], [address] [place], [place] [address]    [place] 03/31/2016   LABORGA ENTEROBACTER SPECIES 03/31/2016     Lab Results  Component Value Date   ALBUMIN 2.8 (L) 11/28/2019   ALBUMIN 2.8 (L) 11/26/2019   ALBUMIN  2.8 (L) 11/26/2019    Lab Results  Component Value Date   MG 2.1 09/04/2019   MG 1.7 08/18/2019   MG 2.1 12/03/2014   No results found for: VD25OH  No results found for: PREALBUMIN CBC EXTENDED Latest Ref Rng & Units 11/28/2019 11/26/2019 11/23/2019  WBC 4.0 - 10.5 K/uL 8.0 7.7 5.9  RBC 4.22 - 5.81 MIL/uL 3.47(L) 3.41(L) 3.57(L)  HGB 13.0 - 17.0 g/dL 10.0(L) 9.7(L) 10.1(L)  HCT 39.0 - 52.0 % 32.1(L) 31.1(L) 32.9(L)  PLT 150 - 400 K/uL 148(L) 134(L) 165  NEUTROABS 1.7 - 7.7 K/uL - 5.1 -  LYMPHSABS 0.7 - 4.0 K/uL - 1.2 -     There is no height or weight on file to calculate BMI.  Orders:  No orders of the defined types were placed in this encounter.  No orders of the defined types were placed in this encounter.    Procedures: No procedures performed  Clinical Data: No additional findings.  ROS:  All other systems negative, except as noted in the HPI. Review of Systems  Objective: Vital Signs: There were no vitals taken for this visit.  Specialty Comments:  No specialty comments available.  PMFS History: Patient Active Problem List   Diagnosis Date Noted  . Malnutrition of moderate degree 11/18/2019  . Below-knee amputation  of left lower extremity ([place]) 11/16/2019  . Abscess of left foot 11/13/2019  . Dehiscence of amputation stump ([place])   . History of complete ray amputation of fifth toe of left foot ([place]) 10/09/2019  . Acute pulmonary edema (HCC)   . Osteomyelitis of fifth toe of left foot ([place])   . Febrile illness 08/17/2019  . SIRS (systemic inflammatory response syndrome) ([place]) 08/17/2019  . Thrombocytopenia ([place]) 08/17/2019  . Pressure injury of back, stage 2 ([place]) 08/17/2019  . Hypertensive emergency 07/10/2019  . Onychomycosis 08/16/2016  . Osteopathy in diseases classified elsewhere, unspecified site 05/14/2016  . Status post unilateral below knee amputation, right ([place]) 04/26/2016  . Bilateral carotid bruits 03/30/2016  . Diabetes mellitus  with complication ([place])   . Anemia due to chronic kidney disease   . ESRD on dialysis ([place]) 11/30/2015  . Acute respiratory failure with hypoxia ([place]) 11/29/2015  . Dependence on renal dialysis ([place]) 08/15/2015  . Presence of automatic (implantable) cardiac defibrillator 03/12/2015  . At high risk for falls 09/29/2014  . Peripheral vascular disease ([place]) 09/29/2014  . Osteoarthritis of both knees 01/07/2014  . Osteoarthritis of multiple joints 10/06/2013  . NICM (nonischemic cardiomyopathy) ([place]) 06/27/2013  . CAD (coronary artery disease) 03/13/2013  . Abnormal nuclear stress test 01/27/2013  . Chronic systolic congestive heart failure ([place]) 01/27/2013  . COPD (chronic obstructive pulmonary disease) ([place]) 12/27/2012  . Diabetic peripheral neuropathy ([place]) 02/10/2012  . Urethral stricture 10/06/2010  . URINARY CALCULUS 09/28/2009  . Adjustment disorder with depressed mood 08/21/2009  . Poorly controlled type II diabetes mellitus with renal complication ([place]) 13/24/4010  . Hyperlipidemia 04/14/2008  . Essential hypertension 04/14/2008  . Allergic rhinitis 04/14/2008   Past Medical History:  Diagnosis Date  . 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 Allergic rhinitis   . Anemia   . Arthritis   . Chronic systolic heart failure ([place])    a. ECHO (12/2012) EF 25-30%, HK entireanteroseptal myocardium //  b.  EF 25%, diffuse HK, grade 1 diastolic dysfunction, MAC, mild LAE, normal RVSF, trivial pericardial effusion  . COPD (chronic obstructive pulmonary disease) ([place])   . Diabetes mellitus type II   . Diabetic nephropathy ([place])   . Diabetic neuropathy ([place])   . ESRD on hemodialysis [facility])    started HD June 2017, goes to [facility] HD unit, Dr Hinda Lenis  . History of cardiac catheterization    a.Myoview 1/15:  There is significant left ventricular dysfunction. There may be slight scar at the apex. There is no significant ischemia. LV Ejection  Fraction: 27%  //  b. RHC/LHC (1/15) with mean RA 6, PA 47/22 mean 33, mean PCWP 20, PVR 2.5 WU, CI 2.5; 80% dLAD stenosis, 70% diffuse large D.   . History of kidney stones   . Hyperlipidemia   . Hypertension   . Kidney stones   . NICM (nonischemic cardiomyopathy) ([place])    Primarily nonischemic. Echo (12/14) with EF 25-30%. Echo (3/15) with EF 25%, mild to moderately dilated LV, normal RV size and systolic function.   . Osteomyelitis ([place])    left fifth ray  . Pneumonia   . Urethral stricture   . Wears glasses     Family History  Problem Relation Age of Onset  . Bladder Cancer Mother   . Alcohol abuse Father   . Melanoma Father   . Stroke Maternal Grandmother   . Heart Problems Maternal Grandmother        unknown  .  Diabetes Maternal Grandmother   . Heart disease Maternal Grandfather   . Prostate cancer Maternal Grandfather     Past Surgical History:  Procedure Laterality Date  . ABDOMINAL AORTOGRAM W/LOWER EXTREMITY N/A 03/30/2016   Procedure: Abdominal Aortogram w/Lower Extremity;  Surgeon: Angelia Mould, MD;  Location: [place] [facility];  Service: Cardiovascular;  Laterality: N/A;  . AMPUTATION Right 04/26/2016   Procedure: Right Below Knee Amputation;  Surgeon: Newt Minion, MD;  Location: [place];  Service: Orthopedics;  Laterality: Right;  . AMPUTATION Left 08/21/2019   Procedure: LEFT FOOT 5TH RAY AMPUTATION;  Surgeon: Newt Minion, MD;  Location: [place];  Service: Orthopedics;  Laterality: Left;  . AMPUTATION Left 11/13/2019   Procedure: LEFT BELOW KNEE AMPUTATION;  Surgeon: Newt Minion, MD;  Location: [place];  Service: Orthopedics;  Laterality: Left;  . AV FISTULA PLACEMENT Right 09/08/2015   Procedure: INSERTION OF 4-21m x 45cm  ARTERIOVENOUS (AV) GORE-TEX GRAFT RIGHT UPPER  ARM;  Surgeon: CAngelia Mould MD;  [place]  Service: Vascular;  Laterality: Right;  . AV FISTULA PLACEMENT Left 01/14/2016   Procedure: CREATION OF LEFT UPPER ARM  ARTERIOVENOUS FISTULA;  Surgeon: CAngelia Mould MD;  Location: [place]  Service: Vascular;  Laterality: Left;  . BASCILIC VEIN TRANSPOSITION Right 08/22/2014   Procedure: RIGHT UPPER ARM BASCILIC VEIN TRANSPOSITION;  Surgeon: CAngelia Mould MD;  Location: [place]  Service: Vascular;  Laterality: Right;  . BELOW KNEE LEG AMPUTATION Right 04/26/2016  . CARDIAC CATHETERIZATION    . CARDIAC DEFIBRILLATOR PLACEMENT  06/27/2013   Sub Q       BY DR KCaryl Comes . CATARACT EXTRACTION W/PHACO Right 08/06/2018   Procedure: CATARACT EXTRACTION PHACO AND INTRAOCULAR LENS PLACEMENT (IOC);  Surgeon: WBaruch Goldmann MD;  [facility];  Service: Ophthalmology;  Laterality: Right;  CDE: 4.06  . CATARACT EXTRACTION W/PHACO Left 08/20/2018   Procedure: CATARACT EXTRACTION PHACO AND INTRAOCULAR LENS PLACEMENT (IOC);  Surgeon: WBaruch Goldmann MD;  [facility];  Service: Ophthalmology;  Laterality: Left;  CDE: 6.76  . COLONOSCOPY WITH PROPOFOL N/A 07/22/2015   Procedure: COLONOSCOPY WITH PROPOFOL;  Surgeon: HDoran Stabler MD;  [facility];  Service: Gastroenterology;  Laterality: N/A;  . FEMORAL-POPLITEAL BYPASS GRAFT Right 03/31/2016   Procedure: BYPASS GRAFT FEMORAL-POPLITEAL ARTERY USING RIGHT GREATER SAPHENOUS NONREVERSED VEIN;  Surgeon: CAngelia Mould MD;  [place]  Service: Vascular;  Laterality: Right;  . HERNIA REPAIR    . I & D EXTREMITY Right 03/31/2016   Procedure: IRRIGATION AND DEBRIDEMENT FOOT;  Surgeon: CAngelia Mould MD;  [place]  Service: Vascular;  Laterality: Right;  . IMPLANTABLE CARDIOVERTER DEFIBRILLATOR IMPLANT N/A 06/27/2013   Procedure: SUB Q ICD;  Surgeon: SDeboraha Sprang MD;  [facility];  Service: Cardiovascular;  Laterality: N/A;  . INTRAOPERATIVE ARTERIOGRAM Right 03/31/2016   Procedure: INTRA OPERATIVE ARTERIOGRAM;  Surgeon: CAngelia Mould MD;  [place]: [facility]  Service: Vascular;  Laterality: Right;  . IR  GENERIC HISTORICAL Right 11/30/2015   IR THROMBECTOMY AV FISTULA W/THROMBOLYSIS/PTA INC/SHUNT/IMG RIGHT 11/30/2015 GAletta Edouard MD [facility]  . IR GENERIC HISTORICAL  11/30/2015   IR [place] VASC ACCESS RIGHT 11/30/2015 GAletta Edouard MD [facility]  . IR GENERIC HISTORICAL Right 12/15/2015   IR THROMBECTOMY AV FISTULA W/THROMBOLYSIS/PTA/STENT INC/SHUNT/IMG RT 12/15/2015 DArne Cleveland MD [facility]  . IR GENERIC HISTORICAL  12/15/2015   IR [place]IDE VASC ACCESS  RIGHT 12/15/2015 Arne Cleveland, MD [facility]  . IR GENERIC HISTORICAL  12/28/2015   IR FLUORO GUIDE CV LINE RIGHT 12/28/2015 Marybelle Killings, MD [facility]  . IR GENERIC HISTORICAL  12/28/2015   IR US GUIDE VASC ACCESS RIGHT 12/28/2015 Marybelle Killings, MD [facility]  . LEFT A ND RIGHT HEART CATH  01/30/2013   DR Sung Amabile  . LEFT AND RIGHT HEART CATHETERIZATION WITH CORONARY ANGIOGRAM N/A 01/30/2013   Procedure: LEFT AND RIGHT HEART CATHETERIZATION WITH CORONARY ANGIOGRAM;  Surgeon: Jolaine Artist, MD;  Location: [facility] LAB;  Service: Cardiovascular;  Laterality: N/A;  . PERIPHERAL VASCULAR CATHETERIZATION Right 01/26/2015   Procedure: A/V Fistulagram;  Surgeon: Angelia Mould, MD;  Location: [place] [facility];  Service: Cardiovascular;  Laterality: Right;  . reapea urethral surgery for recurrent obstruction  2011  . TOTAL KNEE ARTHROPLASTY Right 2007  . VEIN HARVEST Right 03/31/2016   Procedure: RIGHT GREATER SAPHENOUS VEIN HARVEST;  Surgeon: Angelia Mould, MD;  Location: [place];  Service: Vascular;  Laterality: Right;   Social History   Occupational History  . Not on file  Tobacco Use  . Smoking status: Former Smoker    Packs/day: 2.00    Years: 32.00    Pack years: 64.00    Types: Cigarettes    Quit date: 05/11/2010    Years since quitting: 9.7  . Smokeless tobacco: Never Used  Vaping Use  . Vaping Use: Never used  Substance and Sexual Activity  . Alcohol use: No  . Drug use: No  . Sexual  activity: Yes

## 2020-02-20 DIAGNOSIS — N186 End stage renal disease: Secondary | ICD-10-CM | POA: Diagnosis not present

## 2020-02-20 DIAGNOSIS — D509 Iron deficiency anemia, unspecified: Secondary | ICD-10-CM | POA: Diagnosis not present

## 2020-02-20 DIAGNOSIS — Z992 Dependence on renal dialysis: Secondary | ICD-10-CM | POA: Diagnosis not present

## 2020-02-20 DIAGNOSIS — N2581 Secondary hyperparathyroidism of renal origin: Secondary | ICD-10-CM | POA: Diagnosis not present

## 2020-02-20 DIAGNOSIS — D631 Anemia in chronic kidney disease: Secondary | ICD-10-CM | POA: Diagnosis not present

## 2020-02-20 DIAGNOSIS — E209 Hypoparathyroidism, unspecified: Secondary | ICD-10-CM | POA: Diagnosis not present

## 2020-02-22 DIAGNOSIS — N186 End stage renal disease: Secondary | ICD-10-CM | POA: Diagnosis not present

## 2020-02-22 DIAGNOSIS — E209 Hypoparathyroidism, unspecified: Secondary | ICD-10-CM | POA: Diagnosis not present

## 2020-02-22 DIAGNOSIS — Z992 Dependence on renal dialysis: Secondary | ICD-10-CM | POA: Diagnosis not present

## 2020-02-22 DIAGNOSIS — D509 Iron deficiency anemia, unspecified: Secondary | ICD-10-CM | POA: Diagnosis not present

## 2020-02-22 DIAGNOSIS — N2581 Secondary hyperparathyroidism of renal origin: Secondary | ICD-10-CM | POA: Diagnosis not present

## 2020-02-22 DIAGNOSIS — D631 Anemia in chronic kidney disease: Secondary | ICD-10-CM | POA: Diagnosis not present

## 2020-02-25 DIAGNOSIS — N186 End stage renal disease: Secondary | ICD-10-CM | POA: Diagnosis not present

## 2020-02-25 DIAGNOSIS — E209 Hypoparathyroidism, unspecified: Secondary | ICD-10-CM | POA: Diagnosis not present

## 2020-02-25 DIAGNOSIS — Z992 Dependence on renal dialysis: Secondary | ICD-10-CM | POA: Diagnosis not present

## 2020-02-25 DIAGNOSIS — D509 Iron deficiency anemia, unspecified: Secondary | ICD-10-CM | POA: Diagnosis not present

## 2020-02-25 DIAGNOSIS — N2581 Secondary hyperparathyroidism of renal origin: Secondary | ICD-10-CM | POA: Diagnosis not present

## 2020-02-25 DIAGNOSIS — D631 Anemia in chronic kidney disease: Secondary | ICD-10-CM | POA: Diagnosis not present

## 2020-02-27 DIAGNOSIS — N2581 Secondary hyperparathyroidism of renal origin: Secondary | ICD-10-CM | POA: Diagnosis not present

## 2020-02-27 DIAGNOSIS — D509 Iron deficiency anemia, unspecified: Secondary | ICD-10-CM | POA: Diagnosis not present

## 2020-02-27 DIAGNOSIS — Z992 Dependence on renal dialysis: Secondary | ICD-10-CM | POA: Diagnosis not present

## 2020-02-27 DIAGNOSIS — D631 Anemia in chronic kidney disease: Secondary | ICD-10-CM | POA: Diagnosis not present

## 2020-02-27 DIAGNOSIS — N186 End stage renal disease: Secondary | ICD-10-CM | POA: Diagnosis not present

## 2020-02-27 DIAGNOSIS — E209 Hypoparathyroidism, unspecified: Secondary | ICD-10-CM | POA: Diagnosis not present

## 2020-02-29 DIAGNOSIS — Z992 Dependence on renal dialysis: Secondary | ICD-10-CM | POA: Diagnosis not present

## 2020-02-29 DIAGNOSIS — N2581 Secondary hyperparathyroidism of renal origin: Secondary | ICD-10-CM | POA: Diagnosis not present

## 2020-02-29 DIAGNOSIS — N186 End stage renal disease: Secondary | ICD-10-CM | POA: Diagnosis not present

## 2020-02-29 DIAGNOSIS — D509 Iron deficiency anemia, unspecified: Secondary | ICD-10-CM | POA: Diagnosis not present

## 2020-02-29 DIAGNOSIS — E209 Hypoparathyroidism, unspecified: Secondary | ICD-10-CM | POA: Diagnosis not present

## 2020-02-29 DIAGNOSIS — D631 Anemia in chronic kidney disease: Secondary | ICD-10-CM | POA: Diagnosis not present

## 2020-03-02 ENCOUNTER — Ambulatory Visit: Payer: Medicare Other | Admitting: Physical Medicine and Rehabilitation

## 2020-03-03 DIAGNOSIS — D509 Iron deficiency anemia, unspecified: Secondary | ICD-10-CM | POA: Diagnosis not present

## 2020-03-03 DIAGNOSIS — Z992 Dependence on renal dialysis: Secondary | ICD-10-CM | POA: Diagnosis not present

## 2020-03-03 DIAGNOSIS — D631 Anemia in chronic kidney disease: Secondary | ICD-10-CM | POA: Diagnosis not present

## 2020-03-03 DIAGNOSIS — N2581 Secondary hyperparathyroidism of renal origin: Secondary | ICD-10-CM | POA: Diagnosis not present

## 2020-03-03 DIAGNOSIS — N186 End stage renal disease: Secondary | ICD-10-CM | POA: Diagnosis not present

## 2020-03-03 DIAGNOSIS — E209 Hypoparathyroidism, unspecified: Secondary | ICD-10-CM | POA: Diagnosis not present

## 2020-03-05 DIAGNOSIS — D631 Anemia in chronic kidney disease: Secondary | ICD-10-CM | POA: Diagnosis not present

## 2020-03-05 DIAGNOSIS — E209 Hypoparathyroidism, unspecified: Secondary | ICD-10-CM | POA: Diagnosis not present

## 2020-03-05 DIAGNOSIS — D509 Iron deficiency anemia, unspecified: Secondary | ICD-10-CM | POA: Diagnosis not present

## 2020-03-05 DIAGNOSIS — N186 End stage renal disease: Secondary | ICD-10-CM | POA: Diagnosis not present

## 2020-03-05 DIAGNOSIS — N2581 Secondary hyperparathyroidism of renal origin: Secondary | ICD-10-CM | POA: Diagnosis not present

## 2020-03-05 DIAGNOSIS — Z992 Dependence on renal dialysis: Secondary | ICD-10-CM | POA: Diagnosis not present

## 2020-03-07 DIAGNOSIS — D631 Anemia in chronic kidney disease: Secondary | ICD-10-CM | POA: Diagnosis not present

## 2020-03-07 DIAGNOSIS — E209 Hypoparathyroidism, unspecified: Secondary | ICD-10-CM | POA: Diagnosis not present

## 2020-03-07 DIAGNOSIS — D509 Iron deficiency anemia, unspecified: Secondary | ICD-10-CM | POA: Diagnosis not present

## 2020-03-07 DIAGNOSIS — N2581 Secondary hyperparathyroidism of renal origin: Secondary | ICD-10-CM | POA: Diagnosis not present

## 2020-03-07 DIAGNOSIS — Z992 Dependence on renal dialysis: Secondary | ICD-10-CM | POA: Diagnosis not present

## 2020-03-07 DIAGNOSIS — N186 End stage renal disease: Secondary | ICD-10-CM | POA: Diagnosis not present

## 2020-03-09 DIAGNOSIS — N186 End stage renal disease: Secondary | ICD-10-CM | POA: Diagnosis not present

## 2020-03-09 DIAGNOSIS — Z992 Dependence on renal dialysis: Secondary | ICD-10-CM | POA: Diagnosis not present

## 2020-03-10 ENCOUNTER — Ambulatory Visit (INDEPENDENT_AMBULATORY_CARE_PROVIDER_SITE_OTHER): Payer: Medicare Other | Admitting: Physician Assistant

## 2020-03-10 ENCOUNTER — Encounter: Payer: Self-pay | Admitting: Orthopedic Surgery

## 2020-03-10 DIAGNOSIS — D509 Iron deficiency anemia, unspecified: Secondary | ICD-10-CM | POA: Diagnosis not present

## 2020-03-10 DIAGNOSIS — Z89512 Acquired absence of left leg below knee: Secondary | ICD-10-CM

## 2020-03-10 DIAGNOSIS — S88119A Complete traumatic amputation at level between knee and ankle, unspecified lower leg, initial encounter: Secondary | ICD-10-CM

## 2020-03-10 DIAGNOSIS — Z992 Dependence on renal dialysis: Secondary | ICD-10-CM | POA: Diagnosis not present

## 2020-03-10 DIAGNOSIS — E209 Hypoparathyroidism, unspecified: Secondary | ICD-10-CM | POA: Diagnosis not present

## 2020-03-10 DIAGNOSIS — N186 End stage renal disease: Secondary | ICD-10-CM | POA: Diagnosis not present

## 2020-03-10 DIAGNOSIS — Z794 Long term (current) use of insulin: Secondary | ICD-10-CM | POA: Diagnosis not present

## 2020-03-10 DIAGNOSIS — D631 Anemia in chronic kidney disease: Secondary | ICD-10-CM | POA: Diagnosis not present

## 2020-03-10 DIAGNOSIS — N2581 Secondary hyperparathyroidism of renal origin: Secondary | ICD-10-CM | POA: Diagnosis not present

## 2020-03-10 DIAGNOSIS — E119 Type 2 diabetes mellitus without complications: Secondary | ICD-10-CM | POA: Diagnosis not present

## 2020-03-10 NOTE — Progress Notes (Signed)
Office Visit Note   Patient: Anthony Bullock           Date of Birth: [birth year]           MRN: [id number] Visit Date: 03/10/2020              Requested by: [name], MD [place],  [place] [address] PCP: [name], MD  Chief Complaint  Patient presents with  . Left Leg - Follow-up    11/13/2019 left BKA       HPI: Patient is a pleasant 60 year old gentleman who is 4 months status post left below-knee amputation.  He also has a history of a right below-knee amputation.  He is doing well and expects to be fitted for a prosthetic in 2 weeks.  He has 1 very small eschar on the lateral side that is in final stages of resolution  Assessment & Plan: Visit Diagnoses:  1. Below knee amputation [facility])     Plan: Order was placed for physical therapy today.  Patient will follow-up in 1 month.  Continue wearing shrinker  Follow-Up Instructions: No follow-ups on file.   Ortho Exam  Patient is alert, oriented, no adenopathy, well-dressed, normal affect, normal respiratory effort. Well-healed surgical incision.  He has 1 very small 1 cm thin eschar on the lateral side of the incision.  There is no surrounding cellulitis and just a dab of bloody drainage with debridement with a 4 x 4.  Swelling is well controlled.  No evidence of any necrosis no ascending cellulitis no concerns for infection.  He is wearing an Statistician  Imaging: No results found. No images are attached to the encounter.  Labs: Lab Results  Component Value Date   HGBA1C 6.5 (H) 11/13/2019   HGBA1C 8.1 (H) 07/10/2019   HGBA1C 8.9 (H) 08/06/2018   ESRSEDRATE 94 (H) 03/30/2016   ESRSEDRATE 18 11/30/2012   CRP 16.1 (H) 03/30/2016   REPTSTATUS 09/08/2019 FINAL 09/03/2019   GRAMSTAIN  03/31/2016    RARE WBC PRESENT, PREDOMINANTLY PMN NO ORGANISMS SEEN    CULT  09/03/2019    NO GROWTH 5 DAYS Performed at [facility], [address], [place], [place] [address]     [place] 03/31/2016   LABORGA ENTEROBACTER SPECIES 03/31/2016     Lab Results  Component Value Date   ALBUMIN 2.8 (L) 11/28/2019   ALBUMIN 2.8 (L) 11/26/2019   ALBUMIN 2.8 (L) 11/26/2019    Lab Results  Component Value Date   MG 2.1 09/04/2019   MG 1.7 08/18/2019   MG 2.1 12/03/2014   No results found for: VD25OH  No results found for: PREALBUMIN CBC EXTENDED Latest Ref Rng & Units 11/28/2019 11/26/2019 11/23/2019  WBC 4.0 - 10.5 K/uL 8.0 7.7 5.9  RBC 4.22 - 5.81 MIL/uL 3.47(L) 3.41(L) 3.57(L)  HGB 13.0 - 17.0 g/dL 10.0(L) 9.7(L) 10.1(L)  HCT 39.0 - 52.0 % 32.1(L) 31.1(L) 32.9(L)  PLT 150 - 400 K/uL 148(L) 134(L) 165  NEUTROABS 1.7 - 7.7 K/uL - 5.1 -  LYMPHSABS 0.7 - 4.0 K/uL - 1.2 -     There is no height or weight on file to calculate BMI.  Orders:  Orders Placed This Encounter  Procedures  . Ambulatory referral to Physical Therapy   No orders of the defined types were placed in this encounter.    Procedures: No procedures performed  Clinical Data: No additional findings.  ROS:  All other systems negative, except  as noted in the HPI. Review of Systems  Objective: Vital Signs: There were no vitals taken for this visit.  Specialty Comments:  No specialty comments available.  PMFS History: Patient Active Problem List   Diagnosis Date Noted  . Malnutrition of moderate degree 11/18/2019  . Below-knee amputation of left lower extremity ([place]) 11/16/2019  . Abscess of left foot 11/13/2019  . Dehiscence of amputation stump ([place])   . History of complete ray amputation of fifth toe of left foot ([place]) 10/09/2019  . Acute pulmonary edema (HCC)   . Osteomyelitis of fifth toe of left foot ([place])   . Febrile illness 08/17/2019  . SIRS (systemic inflammatory response syndrome) ([place]) 08/17/2019  . Thrombocytopenia ([place]) 08/17/2019  . Pressure injury of back, stage 2 ([place]) 08/17/2019  . Hypertensive emergency 07/10/2019  .  Onychomycosis 08/16/2016  . Osteopathy in diseases classified elsewhere, unspecified site 05/14/2016  . Status post unilateral below knee amputation, right ([place]) 04/26/2016  . Bilateral carotid bruits 03/30/2016  . Diabetes mellitus with complication ([place])   . Anemia due to chronic kidney disease   . ESRD on dialysis ([place]) 11/30/2015  . Acute respiratory failure with hypoxia ([place]) 11/29/2015  . Dependence on renal dialysis ([place]) 08/15/2015  . Presence of automatic (implantable) cardiac defibrillator 03/12/2015  . At high risk for falls 09/29/2014  . Peripheral vascular disease ([place]) 09/29/2014  . Osteoarthritis of both knees 01/07/2014  . Osteoarthritis of multiple joints 10/06/2013  . NICM (nonischemic cardiomyopathy) ([place]) 06/27/2013  . CAD (coronary artery disease) 03/13/2013  . Abnormal nuclear stress test 01/27/2013  . Chronic systolic congestive heart failure ([place]) 01/27/2013  . COPD (chronic obstructive pulmonary disease) ([place]) 12/27/2012  . Diabetic peripheral neuropathy ([place]) 02/10/2012  . Urethral stricture 10/06/2010  . URINARY CALCULUS 09/28/2009  . Adjustment disorder with depressed mood 08/21/2009  . Poorly controlled type II diabetes mellitus with renal complication ([place]) 44/81/8563  . Hyperlipidemia 04/14/2008  . Essential hypertension 04/14/2008  . Allergic rhinitis 04/14/2008   Past Medical History:  Diagnosis Date  . 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 Allergic rhinitis   . Anemia   . Arthritis   . Chronic systolic heart failure ([place])    a. ECHO (12/2012) EF 25-30%, HK entireanteroseptal myocardium //  b.  EF 25%, diffuse HK, grade 1 diastolic dysfunction, MAC, mild LAE, normal RVSF, trivial pericardial effusion  . COPD (chronic obstructive pulmonary disease) ([place])   . Diabetes mellitus type II   . Diabetic nephropathy ([place])   . Diabetic neuropathy ([place])   . ESRD on hemodialysis [facility])    started HD June 2017, goes to  [facility] HD unit, Dr Hinda Lenis  . History of cardiac catheterization    a.Myoview 1/15:  There is significant left ventricular dysfunction. There may be slight scar at the apex. There is no significant ischemia. LV Ejection Fraction: 27%  //  b. RHC/LHC (1/15) with mean RA 6, PA 47/22 mean 33, mean PCWP 20, PVR 2.5 WU, CI 2.5; 80% dLAD stenosis, 70% diffuse large D.   . History of kidney stones   . Hyperlipidemia   . Hypertension   . Kidney stones   . NICM (nonischemic cardiomyopathy) ([place])    Primarily nonischemic. Echo (12/14) with EF 25-30%. Echo (3/15) with EF 25%, mild to moderately dilated LV, normal RV size and systolic function.   . Osteomyelitis ([place])    left fifth ray  . Pneumonia   . Urethral stricture   .  Wears glasses     Family History  Problem Relation Age of Onset  . Bladder Cancer Mother   . Alcohol abuse Father   . Melanoma Father   . Stroke Maternal Grandmother   . Heart Problems Maternal Grandmother        unknown  . Diabetes Maternal Grandmother   . Heart disease Maternal Grandfather   . Prostate cancer Maternal Grandfather     Past Surgical History:  Procedure Laterality Date  . ABDOMINAL AORTOGRAM W/LOWER EXTREMITY N/A 03/30/2016   Procedure: Abdominal Aortogram w/Lower Extremity;  Surgeon: Angelia Mould, MD;  Location: [place] [facility];  Service: Cardiovascular;  Laterality: N/A;  . AMPUTATION Right 04/26/2016   Procedure: Right Below Knee Amputation;  Surgeon: Newt Minion, MD;  Location: [place];  Service: Orthopedics;  Laterality: Right;  . AMPUTATION Left 08/21/2019   Procedure: LEFT FOOT 5TH RAY AMPUTATION;  Surgeon: Newt Minion, MD;  Location: [place];  Service: Orthopedics;  Laterality: Left;  . AMPUTATION Left 11/13/2019   Procedure: LEFT BELOW KNEE AMPUTATION;  Surgeon: Newt Minion, MD;  Location: [place];  Service: Orthopedics;  Laterality: Left;  . AV FISTULA PLACEMENT Right 09/08/2015   Procedure: INSERTION OF 4-51m x 45cm   ARTERIOVENOUS (AV) GORE-TEX GRAFT RIGHT UPPER  ARM;  Surgeon: CAngelia Mould MD;  [place]: [place]  Service: Vascular;  Laterality: Right;  . AV FISTULA PLACEMENT Left 01/14/2016   Procedure: CREATION OF LEFT UPPER ARM ARTERIOVENOUS FISTULA;  Surgeon: CAngelia Mould MD;  [place]  Service: Vascular;  Laterality: Left;  . BASCILIC VEIN TRANSPOSITION Right 08/22/2014   Procedure: RIGHT UPPER ARM BASCILIC VEIN TRANSPOSITION;  Surgeon: CAngelia Mould MD;  Location: [place]  Service: Vascular;  Laterality: Right;  . BELOW KNEE LEG AMPUTATION Right 04/26/2016  . CARDIAC CATHETERIZATION    . CARDIAC DEFIBRILLATOR PLACEMENT  06/27/2013   Sub Q       BY DR KCaryl Comes . CATARACT EXTRACTION W/PHACO Right 08/06/2018   Procedure: CATARACT EXTRACTION PHACO AND INTRAOCULAR LENS PLACEMENT (IOC);  Surgeon: WBaruch Goldmann MD;  [facility];  Service: Ophthalmology;  Laterality: Right;  CDE: 4.06  . CATARACT EXTRACTION W/PHACO Left 08/20/2018   Procedure: CATARACT EXTRACTION PHACO AND INTRAOCULAR LENS PLACEMENT (IOC);  Surgeon: WBaruch Goldmann MD;  [facility];  Service: Ophthalmology;  Laterality: Left;  CDE: 6.76  . COLONOSCOPY WITH PROPOFOL N/A 07/22/2015   Procedure: COLONOSCOPY WITH PROPOFOL;  Surgeon: HDoran Stabler MD;  [facility];  Service: Gastroenterology;  Laterality: N/A;  . FEMORAL-POPLITEAL BYPASS GRAFT Right 03/31/2016   Procedure: BYPASS GRAFT FEMORAL-POPLITEAL ARTERY USING RIGHT GREATER SAPHENOUS NONREVERSED VEIN;  Surgeon: CAngelia Mould MD;  [place]  Service: Vascular;  Laterality: Right;  . HERNIA REPAIR    . I & D EXTREMITY Right 03/31/2016   Procedure: IRRIGATION AND DEBRIDEMENT FOOT;  Surgeon: CAngelia Mould MD;  [place]  Service: Vascular;  Laterality: Right;  . IMPLANTABLE CARDIOVERTER DEFIBRILLATOR IMPLANT N/A 06/27/2013   Procedure: SUB Q ICD;  Surgeon: SDeboraha Sprang MD;  [facility];  Service:  Cardiovascular;  Laterality: N/A;  . INTRAOPERATIVE ARTERIOGRAM Right 03/31/2016   Procedure: INTRA OPERATIVE ARTERIOGRAM;  Surgeon: CAngelia Mould MD;  [facility] [facility]  Service: Vascular;  Laterality: Right;  . IR GENERIC HISTORICAL Right 11/30/2015   IR THROMBECTOMY AV FISTULA W/THROMBOLYSIS/PTA INC/SHUNT/IMG RIGHT 11/30/2015 GAletta Edouard MD [facility]  . IR GENERIC  HISTORICAL  11/30/2015   IR US GUIDE VASC ACCESS RIGHT 11/30/2015 Aletta Edouard, MD [facility]  . IR GENERIC HISTORICAL Right 12/15/2015   IR THROMBECTOMY AV FISTULA W/THROMBOLYSIS/PTA/STENT INC/SHUNT/IMG RT 12/15/2015 Arne Cleveland, MD [facility]  . IR GENERIC HISTORICAL  12/15/2015   IR US GUIDE VASC ACCESS RIGHT 12/15/2015 Arne Cleveland, MD [facility]  . IR GENERIC HISTORICAL  12/28/2015   IR FLUORO GUIDE CV LINE RIGHT 12/28/2015 Marybelle Killings, MD [facility]  . IR GENERIC HISTORICAL  12/28/2015   IR US GUIDE VASC ACCESS RIGHT 12/28/2015 Marybelle Killings, MD [facility]  . LEFT A ND RIGHT HEART CATH  01/30/2013   DR Sung Amabile  . LEFT AND RIGHT HEART CATHETERIZATION WITH CORONARY ANGIOGRAM N/A 01/30/2013   Procedure: LEFT AND RIGHT HEART CATHETERIZATION WITH CORONARY ANGIOGRAM;  Surgeon: Jolaine Artist, MD;  Location: [facility] CATH LAB;  Service: Cardiovascular;  Laterality: N/A;  . PERIPHERAL VASCULAR CATHETERIZATION Right 01/26/2015   Procedure: A/V Fistulagram;  Surgeon: Angelia Mould, MD;  Location: [place] CV LAB;  Service: Cardiovascular;  Laterality: Right;  . reapea urethral surgery for recurrent obstruction  2011  . TOTAL KNEE ARTHROPLASTY Right 2007  . VEIN HARVEST Right 03/31/2016   Procedure: RIGHT GREATER SAPHENOUS VEIN HARVEST;  Surgeon: Angelia Mould, MD;  Location: [place];  Service: Vascular;  Laterality: Right;   Social History   Occupational History  . Not on file  Tobacco Use  . Smoking status: Former Smoker    Packs/day: 2.00    Years: 32.00    Pack years: 64.00     Types: Cigarettes    Quit date: 05/11/2010    Years since quitting: 9.8  . Smokeless tobacco: Never Used  Vaping Use  . Vaping Use: Never used  Substance and Sexual Activity  . Alcohol use: No  . Drug use: No  . Sexual activity: Yes

## 2020-03-12 DIAGNOSIS — N2581 Secondary hyperparathyroidism of renal origin: Secondary | ICD-10-CM | POA: Diagnosis not present

## 2020-03-12 DIAGNOSIS — D509 Iron deficiency anemia, unspecified: Secondary | ICD-10-CM | POA: Diagnosis not present

## 2020-03-12 DIAGNOSIS — E209 Hypoparathyroidism, unspecified: Secondary | ICD-10-CM | POA: Diagnosis not present

## 2020-03-12 DIAGNOSIS — N186 End stage renal disease: Secondary | ICD-10-CM | POA: Diagnosis not present

## 2020-03-12 DIAGNOSIS — Z992 Dependence on renal dialysis: Secondary | ICD-10-CM | POA: Diagnosis not present

## 2020-03-12 DIAGNOSIS — D631 Anemia in chronic kidney disease: Secondary | ICD-10-CM | POA: Diagnosis not present

## 2020-03-14 DIAGNOSIS — N186 End stage renal disease: Secondary | ICD-10-CM | POA: Diagnosis not present

## 2020-03-14 DIAGNOSIS — D509 Iron deficiency anemia, unspecified: Secondary | ICD-10-CM | POA: Diagnosis not present

## 2020-03-14 DIAGNOSIS — D631 Anemia in chronic kidney disease: Secondary | ICD-10-CM | POA: Diagnosis not present

## 2020-03-14 DIAGNOSIS — Z992 Dependence on renal dialysis: Secondary | ICD-10-CM | POA: Diagnosis not present

## 2020-03-14 DIAGNOSIS — E209 Hypoparathyroidism, unspecified: Secondary | ICD-10-CM | POA: Diagnosis not present

## 2020-03-14 DIAGNOSIS — N2581 Secondary hyperparathyroidism of renal origin: Secondary | ICD-10-CM | POA: Diagnosis not present

## 2020-03-15 NOTE — Progress Notes (Addendum)
Cardiology Office Note Date:  03/15/2020  Patient ID:  Anthony Bullock, Anthony Bullock 01/29/1960, MRN [id number] PCP:  [name], MD  Cardiologist:  Dr. Harl Bowie Electrophysiologist: Dr. Caryl Comes Nephrology: Dr. Lowanda Foster is retired, he can not recall the name of MD that took over HD at [facility] in [place]    Chief Complaint:  Device at [facility]  History of Present Illness: TANNER YELEY is a 60 y.o. male with history of NICM w/S-ICD, chronic CHF, systolic, ESERF on HD DM, HTN, CAD with LHC in 1/15 demonstrated probable diabetic vascular disease. Initial echo in 12/14 with EF 25-30%. most recent echo in 2021 was 5%, PVD w/R BKA  He comes to the office today to be seen for Dr. Caryl Comes. Last seen by him Dec 2019, this visit prompted by ICD shock (though this statement seems to be carried over from prior notes).  Seems he was doing well,  No changes were made.  Mentioned he may benefit/need ProAmatine dialysis days 2/2 LH (?)   He saw [facility] 12/19/2018, R sided AV fistula and graft thrombosed and nonfunctioning chronically, L brachiocephalic fistula created in January 2018, patient never follow up afterwards until this month.   The patient has been using a [place] for dialysis over the past 2 years.  He is aware that there is a concern for infection of TDC.  He is also aware that the next step is likely to place a left arm AV graft due to a nonmaturing fistula.  Patient would like to continue dialyzing with [facility] knowing infection risk   I saw him 12/28/2018 He is doing well.  He has HD T-Th-Sat, his BP tends to drop with HD, and does not take his BP pills in the AM.  He denies any CP, palpitations, no rest SOB.  He denies difficulties with his ADLs.  He will feel a little bloated and easier. winded when he has been liberal with his diet, but otherwise denies SOB/DOE No dizzy spells, near syncope or syncope, no shocks He tells me his PMD monitors and manages his cholestorol.  Device had approx year of life, planned to see  him back in 42mo Device clinic check was done June with approx 612moattery life  Nov 2021 had L BKA 2/2 gangrenous foot wound.  He saw Dr. BrHarl Bowie/4/22, no changes were made, doing OK from cardiology standpoint, occ low BPs with HD with consideration of midodrine with dialysis if needed.  TODAY He is accompanied by his wife. L BKA is healing well, and soon should be about ready for prosthetic fitting. He was started on midodrine to take prior to HD, can not recall the dose. No near syncope or syncope, no BP issues on non-dialysis days. No CP, palpitations No SOB.  Labs are done regularly at HD    Device information:  [employer]-ICD, implanted 06/27/13    Past Medical History:  Diagnosis Date   AICD (automatic cardioverter/defibrillator) present    [employer]   Allergic rhinitis    Anemia    Arthritis    Chronic systolic heart failure ([place]   a. ECHO (12/2012) EF 25-30%, HK entireanteroseptal myocardium //  b.  EF 25%, diffuse HK, grade 1 diastolic dysfunction, MAC, mild LAE, normal RVSF, trivial pericardial effusion   COPD (chronic obstructive pulmonary disease) ([place]   Diabetes mellitus type II    Diabetic nephropathy ([place]   Diabetic neuropathy ([place]   ESRD on hemodialysis ([place]  started HD June 2017, goes to [facility] unit, Dr Hinda Lenis   History of cardiac catheterization    a.Myoview 1/15:  There is significant left ventricular dysfunction. There may be slight scar at the apex. There is no significant ischemia. LV Ejection Fraction: 27%  //  b. RHC/LHC (1/15) with mean RA 6, PA 47/22 mean 33, mean PCWP 20, PVR 2.5 WU, CI 2.5; 80% dLAD stenosis, 70% diffuse large D.    History of kidney stones    Hyperlipidemia    Hypertension    Kidney stones    NICM (nonischemic cardiomyopathy) ([place])    Primarily nonischemic. Echo (12/14) with EF 25-30%. Echo (3/15) with EF 25%, mild to moderately dilated LV, normal RV size and systolic  function.    Osteomyelitis ([place])    left fifth ray   Pneumonia    Urethral stricture    Wears glasses     Past Surgical History:  Procedure Laterality Date   ABDOMINAL AORTOGRAM W/LOWER EXTREMITY N/A 03/30/2016   Procedure: Abdominal Aortogram w/Lower Extremity;  Surgeon: Angelia Mould, MD;  Location: [place] CV LAB;  Service: Cardiovascular;  Laterality: N/A;   AMPUTATION Right 04/26/2016   Procedure: Right Below Knee Amputation;  Surgeon: Newt Minion, MD;  Location: [place];  Service: Orthopedics;  Laterality: Right;   AMPUTATION Left 08/21/2019   Procedure: LEFT FOOT 5TH RAY AMPUTATION;  Surgeon: Newt Minion, MD;  Location: [place];  Service: Orthopedics;  Laterality: Left;   AMPUTATION Left 11/13/2019   Procedure: LEFT BELOW KNEE AMPUTATION;  Surgeon: Newt Minion, MD;  Location: [place];  Service: Orthopedics;  Laterality: Left;   AV FISTULA PLACEMENT Right 09/08/2015   Procedure: INSERTION OF 4-52m x 45cm  ARTERIOVENOUS (AV) GORE-TEX GRAFT RIGHT UPPER  ARM;  Surgeon: CAngelia Mould MD;  [place]  Service: Vascular;  Laterality: Right;   AV FISTULA PLACEMENT Left 01/14/2016   Procedure: CREATION OF LEFT UPPER ARM ARTERIOVENOUS FISTULA;  Surgeon: CAngelia Mould MD;  [place]  Service: Vascular;  Laterality: Left;   BT[place]Right 08/22/2014   Procedure: RIGHT UPPER ARM [place]  Surgeon: CAngelia Mould MD;  [place]: [place]  Service: Vascular;  Laterality: Right;   BELOW KNEE LEG AMPUTATION Right 04/26/2016   CARDIAC CATHETERIZATION     CARDIAC DEFIBRILLATOR PLACEMENT  06/27/2013   Sub Q       BY DR KCaryl Comes  CATARACT EXTRACTION W/PHACO Right 08/06/2018   Procedure: CATARACT EXTRACTION PHACO AND INTRAOCULAR LENS PLACEMENT ([place];  Surgeon: WBaruch Goldmann MD;  Location: [facility];  Service: Ophthalmology;  Laterality: Right;  CDE: 4.06   CATARACT EXTRACTION W/PHACO Left 08/20/2018    Procedure: CATARACT EXTRACTION PHACO AND INTRAOCULAR LENS PLACEMENT (IOC);  Surgeon: WBaruch Goldmann MD;  Location: [facility];  Service: Ophthalmology;  Laterality: Left;  CDE: 6.76   COLONOSCOPY WITH PROPOFOL N/A 07/22/2015   Procedure: COLONOSCOPY WITH PROPOFOL;  Surgeon: HDoran Stabler MD;  [facility];  Service: Gastroenterology;  Laterality: N/A;   FEMORAL-POPLITEAL BYPASS GRAFT Right 03/31/2016   Procedure: BYPASS GRAFT FEMORAL-POPLITEAL ARTERY USING RIGHT GREATER SAPHENOUS NONREVERSED VEIN;  Surgeon: CAngelia Mould MD;  [place]  Service: Vascular;  Laterality: Right;   HERNIA REPAIR     I & D EXTREMITY Right 03/31/2016   Procedure: IRRIGATION AND DEBRIDEMENT FOOT;  Surgeon: CAngelia Mould MD;  [place]  Service: Vascular;  Laterality: Right;  IMPLANTABLE CARDIOVERTER DEFIBRILLATOR IMPLANT N/A 06/27/2013   Procedure: SUB Q ICD;  Surgeon: Deboraha Sprang, MD;  Location: [facility] CATH LAB;  Service: Cardiovascular;  Laterality: N/A;   INTRAOPERATIVE ARTERIOGRAM Right 03/31/2016   Procedure: INTRA OPERATIVE ARTERIOGRAM;  Surgeon: Angelia Mould, MD;  Location: [place];  Service: Vascular;  Laterality: Right;   IR GENERIC HISTORICAL Right 11/30/2015   IR THROMBECTOMY AV FISTULA W/THROMBOLYSIS/PTA INC/SHUNT/IMG RIGHT 11/30/2015 Aletta Edouard, MD [facility]   IR GENERIC HISTORICAL  11/30/2015   IR US GUIDE VASC ACCESS RIGHT 11/30/2015 Aletta Edouard, MD [facility]   IR GENERIC HISTORICAL Right 12/15/2015   IR THROMBECTOMY AV FISTULA W/THROMBOLYSIS/PTA/STENT INC/SHUNT/IMG RT 12/15/2015 Arne Cleveland, MD [facility]   IR GENERIC HISTORICAL  12/15/2015   IR US GUIDE VASC ACCESS RIGHT 12/15/2015 Arne Cleveland, MD [facility]   IR GENERIC HISTORICAL  12/28/2015   IR FLUORO GUIDE CV LINE RIGHT 12/28/2015 Marybelle Killings, MD [facility]   IR GENERIC HISTORICAL  12/28/2015   IR US GUIDE VASC ACCESS RIGHT 12/28/2015 Marybelle Killings, MD [facility]    LEFT A ND RIGHT HEART CATH  01/30/2013   DR Sung Amabile   LEFT AND RIGHT HEART CATHETERIZATION WITH CORONARY ANGIOGRAM N/A 01/30/2013   Procedure: LEFT AND RIGHT HEART CATHETERIZATION WITH CORONARY ANGIOGRAM;  Surgeon: Jolaine Artist, MD;  Location: [facility] CATH LAB;  Service: Cardiovascular;  Laterality: N/A;   PERIPHERAL VASCULAR CATHETERIZATION Right 01/26/2015   Procedure: A/V Fistulagram;  Surgeon: Angelia Mould, MD;  Location: [place] CV LAB;  Service: Cardiovascular;  Laterality: Right;   reapea urethral surgery for recurrent obstruction  2011   TOTAL KNEE ARTHROPLASTY Right 2007   VEIN HARVEST Right 03/31/2016   Procedure: RIGHT GREATER SAPHENOUS VEIN HARVEST;  Surgeon: Angelia Mould, MD;  [facility];  Service: Vascular;  Laterality: Right;    Current Outpatient Medications  Medication Sig Dispense Refill   acetaminophen (TYLENOL) 325 MG tablet Take 1-2 tablets (325-650 mg total) by mouth every 4 (four) hours as needed for mild pain.     albuterol (VENTOLIN HFA) 108 (90 Base) MCG/ACT inhaler Inhale 2 puffs into the lungs every 4 (four) hours as needed for wheezing or shortness of breath. 1 each 1   aspirin EC 81 MG tablet Take 81 mg by mouth daily.      atorvastatin (LIPITOR) 80 MG tablet Take 1 tablet (80 mg total) by mouth at bedtime. 90 tablet 3   azelastine (ASTELIN) 0.1 % nasal spray Place 2 sprays into both nostrils 3 (three) times daily as needed for rhinitis. Use in each nostril as directed 30 mL 12   carvedilol (COREG) 25 MG tablet Take 1 tablet (25 mg total) by mouth 2 (two) times daily. 60 tablet 0   cinacalcet (SENSIPAR) 30 MG tablet Take 1 tablet (30 mg total) by mouth Every Tuesday,Thursday,and Saturday with dialysis. 60 tablet    Continuous Blood Gluc Sensor (FREESTYLE LIBRE 14 DAY SENSOR) MISC USE AS DIRECTED EVERY 14 DAYS 4 each 11   doxercalciferol (HECTOROL) 4 MCG/2ML injection Inject 1.25 mLs (2.5 mcg total) into the vein Every  Tuesday,Thursday,and Saturday with dialysis. 2 mL    fenofibrate 160 MG tablet Take 1 tablet (160 mg total) by mouth daily. 90 tablet 3   fexofenadine (ALLEGRA) 180 MG tablet Take 180 mg by mouth daily.     furosemide (LASIX) 40 MG tablet Take 3 tablets by mouth once daily (Patient taking differently: Take 120 mg by mouth daily.) 270  tablet 0   gabapentin (NEURONTIN) 100 MG capsule Take one pill in morning and two pills at bedtime. 90 capsule 0   glucose blood test strip Check 1 time daily. E11.9 One Touch Ultra Blue Test Strips 100 each 3   Insulin Glargine (BASAGLAR KWIKPEN) 100 UNIT/ML INJECT 12 UNITS SUBCUTANEOUSLY AT BEDTIME 15 mL 11   Insulin Lispro (HUMALOG KWIKPEN) 200 UNIT/ML SOPN Inject 10 Units into the skin 3 (three) times daily with meals. Use tid with meals as per sliding scale (Patient taking differently: Inject 2-14 Units into the skin 3 (three) times daily with meals. sliding scale) 4 pen 6   Insulin Pen Needle (BD PEN NEEDLE NANO U/F) 32G X 4 MM MISC USE 1 PEN NEEDLE SUBCUTANEOUSLY WITH INSULIN 4 TIMES DAILY 400 each 0   lanthanum (FOSRENOL) 500 MG chewable tablet Chew 500-1,000 mg by mouth See admin instructions. Take 2000 mg with meals three time a day and 500 mg with snacks     multivitamin (RENA-VIT) TABS tablet Take 1 tablet by mouth daily. 90 tablet 3   Olopatadine HCl 0.2 % SOLN Place 1 drop into both eyes daily as needed (for allergies).      No current facility-administered medications for this visit.    Allergies:   Epoetin alfa, Ferumoxytol, and Morphine sulfate   Social History:  The patient  reports that he quit smoking about 9 years ago. His smoking use included cigarettes. He has a 64.00 pack-year smoking history. He has never used smokeless tobacco. He reports that he does not drink alcohol and does not use drugs.   Family History:  The patient's family history includes Alcohol abuse in his father; Bladder Cancer in his mother; Diabetes in his maternal  grandmother; Heart Problems in his maternal grandmother; Heart disease in his maternal grandfather; Melanoma in his father; Prostate cancer in his maternal grandfather; Stroke in his maternal grandmother.  ROS:  Please see the history of present illness.   All other systems are reviewed and otherwise negative.   PHYSICAL EXAM:  VS:  There were no vitals taken for this visit. BMI: There is no height or weight on file to calculate BMI. Well nourished, well developed, in no acute distress  HEENT: normocephalic, atraumatic  Neck: no JVD, carotid bruits or masses Cardiac: RRR; no significant murmurs, no rubs, or gallops Lungs:  CTA b/l, no wheezing, rhonchi or rales  R chest dialysis catheter Abd: soft, nontender MS: no deformity or atrophy Ext: R BKA with prosthesis, L BKA is in a splint Skin: warm and dry, no rash Neuro:  No gross deficits appreciated Psych: euthymic mood, full affect  S-ICD site is stable, no tethering or discomfort   EKG:  Not done today   ICD interrogation done today reviewed by myself :  Battery is ERI, reached 11/06/19 Lead impedance is 100 No arrhythmias One lifetime shock, this would have been at implant   09/03/2019: TTE IMPRESSIONS  1. Left ventricular ejection fraction, by estimation, is 50%. The left  ventricle has mildly decreased function. Left ventricular endocardial  border not optimally defined to evaluate regional wall motion. There is  moderate left ventricular hypertrophy.  Left ventricular diastolic parameters are consistent with Grade I  diastolic dysfunction (impaired relaxation).  2. Right ventricular systolic function is normal. The right ventricular  size is normal.  3. Left atrial size was mildly dilated.  4. The mitral valve is normal in structure. No evidence of mitral valve  regurgitation. No evidence of mitral  stenosis.  5. The aortic valve has an indeterminant number of cusps. Aortic valve  regurgitation is not  visualized. No aortic stenosis is present.  6. The inferior vena cava is normal in size with greater than 50%  respiratory variability, suggesting right atrial pressure of 3 mmHg.    09/09/2016: TTE Study Conclusions - Left ventricle: The cavity size was mildly dilated. Wall   thickness was increased increased in a pattern of mild to   moderate LVH. The estimated ejection fraction was 55%. Wall   motion was normal; there were no regional wall motion   abnormalities. Doppler parameters are consistent with abnormal   left ventricular relaxation (grade 1 diastolic dysfunction). - Mitral valve: Mildly calcified annulus. There was trivial   regurgitation. - Right ventricle: Pacer wire or catheter noted in right ventricle. - Right atrium: Central venous pressure (est): 3 mm Hg. - Tricuspid valve: There was physiologic regurgitation. - Pulmonary arteries: Systolic pressure could not be accurately   estimated. - Pericardium, extracardiac: There was no pericardial effusion.  Impressions: - Mild to moderate LVH with mild chamber dilatation and LVEF 55%.   Grade 1 diastolic dysfunction. Mildly calcified mitral annulus   with trivial mitral regurgitation. Device wire present within the   right heart.   08/27/2015: TTE Study Conclusions  - Left ventricle: The cavity size was normal. Systolic function was  normal. The estimated ejection fraction was in the range of 50%  to 55%. Wall motion was normal; there were no regional wall  motion abnormalities. Doppler parameters are consistent with  abnormal left ventricular relaxation (grade 1 diastolic  dysfunction).  - Left atrium: The atrium was mildly dilated.  - Atrial septum: No defect or patent foramen ovale was identified.    03/13/13: Echocardiogram Study Conclusions - Left ventricle: The cavity size was mildly to moderately dilated. Wall thickness was normal. The estimated ejection fraction was 25%. Diffuse hypokinesis.  Doppler parameters are consistent with abnormal left ventricular relaxation (grade 1 diastolic dysfunction). - Aortic valve: There was no stenosis. - Mitral valve: Mildly calcified annulus. Trivial regurgitation. - Left atrium: The atrium was mildly dilated. - Right ventricle: The cavity size was normal. Systolic function was normal. - Pulmonary arteries: No complete TR doppler jet so unable to estimate PA systolic pressure. - Inferior vena cava: The vessel was normal in size; the respirophasic diameter changes were in the normal range (= 50%); findings are consistent with normal central venous pressure. - Pericardium, extracardiac: A trivial pericardial effusion was identified. Impressions: - Mild to moderate LV dilation with global hypokinesis, EF 25%. Normal RV size and systolic function. No significant valvular abnormalities.  01/30/13: R/L heart cath, Dr. Haroldine Laws Assessment: 1. CAD with diffuse diabetic vasculopathy. Only high grade lesion is in small distal LAD 2. Well-compensated hemodynamics Plan/Discussion: He has diffuse diabetic vasculopathy but TIMI-3 flow throughout. Suspect he has NICM. Continue medical therapy. Will hydrate overnight and watch renal function. Home in am if stable.  Recent Labs: 09/03/2019: B Natriuretic Peptide 1,193.0 09/04/2019: ALT 14; Magnesium 2.1; TSH 0.751 11/28/2019: BUN 63; Creatinine, Ser 10.21; Hemoglobin 10.0; Platelets 148; Potassium 5.6; Sodium 134  05/08/2019: Cholesterol 158; HDL 37.00; LDL Cholesterol 84; Total CHOL/HDL Ratio 4; Triglycerides 187.0; VLDL 37.4   CrCl cannot be calculated (Patient's most recent lab result is older than the maximum 21 days allowed.).   Wt Readings from Last 3 Encounters:  02/14/20 175 lb (79.4 kg)  01/08/20 174 lb (78.9 kg)  01/07/20 178 lb (80.7 kg)  Other studies reviewed: Additional studies/records reviewed today include: summarized above  ASSESSMENT AND  PLAN:  1. Chronic systolic CHF  2. Nonischemic cardiomyopathy      Last echos 2017, 2018, 2021 with recovered LVEF  3. S-ICD in place     Device is ERI    Volume management with HD  I have discussed case with D. Caryl Comes, reasonable to consider to leave device alone given recovered LVEF, though is patient preference, and if pt prefers gen change we would proceed as well.     We discussed options of replacing the device vs leaving it in place and allowing the battery to run out given his LVEF has been recovered now for years by multiple echos. They would prefer to let the battery run out and leave it in place.  3. HTN      Looks OK here     Midodrine prior to HD with hypotension during his sessions  4. CAD      No symptoms of angina     Just saw Dr. Harl Bowie, c/w with him as directed   [facility] rep was in the office today, beeper alert was turned off,  Discussed, with him, there is no EPL behavior fro the device, it will just cease to function.        Disposition:  EP will see PRN    Current medicines are reviewed at length with the patient today.  The patient did not have any concerns regarding medicines.    Haywood Lasso, PA-C 03/15/2020 1:52 PM     [address] [place] [address] [phone] (office)  [phone] (fax)

## 2020-03-17 ENCOUNTER — Ambulatory Visit (INDEPENDENT_AMBULATORY_CARE_PROVIDER_SITE_OTHER): Payer: Medicare Other | Admitting: Physician Assistant

## 2020-03-17 ENCOUNTER — Other Ambulatory Visit: Payer: Self-pay

## 2020-03-17 ENCOUNTER — Encounter: Payer: Self-pay | Admitting: Physician Assistant

## 2020-03-17 VITALS — BP 122/66 | HR 81 | Ht 70.0 in | Wt 174.0 lb

## 2020-03-17 DIAGNOSIS — N2581 Secondary hyperparathyroidism of renal origin: Secondary | ICD-10-CM | POA: Diagnosis not present

## 2020-03-17 DIAGNOSIS — D509 Iron deficiency anemia, unspecified: Secondary | ICD-10-CM | POA: Diagnosis not present

## 2020-03-17 DIAGNOSIS — Z95810 Presence of automatic (implantable) cardiac defibrillator: Secondary | ICD-10-CM | POA: Diagnosis not present

## 2020-03-17 DIAGNOSIS — Z992 Dependence on renal dialysis: Secondary | ICD-10-CM | POA: Diagnosis not present

## 2020-03-17 DIAGNOSIS — D631 Anemia in chronic kidney disease: Secondary | ICD-10-CM | POA: Diagnosis not present

## 2020-03-17 DIAGNOSIS — I2510 Atherosclerotic heart disease of native coronary artery without angina pectoris: Secondary | ICD-10-CM

## 2020-03-17 DIAGNOSIS — E209 Hypoparathyroidism, unspecified: Secondary | ICD-10-CM | POA: Diagnosis not present

## 2020-03-17 DIAGNOSIS — I428 Other cardiomyopathies: Secondary | ICD-10-CM | POA: Diagnosis not present

## 2020-03-17 DIAGNOSIS — N186 End stage renal disease: Secondary | ICD-10-CM | POA: Diagnosis not present

## 2020-03-17 NOTE — Patient Instructions (Signed)
Medication Instructions:    Your physician recommends that you continue on your current medications as directed. Please refer to the Current Medication list given to you today.  *If you need a refill on your cardiac medications before your next appointment, please call your pharmacy*   Lab Work: [place]   If you have labs (blood work) drawn today and your tests are completely normal, you will receive your results only by: [name] MyChart Message (if you have MyChart) OR . A paper copy in the mail If you have any lab test that is abnormal or we need to change your treatment, we will call you to review the results.   Testing/Procedures: NONE ORDERED  TODAY     Follow-Up: At [facility], you and your health needs are our priority.  As part of our continuing mission to provide you with exceptional heart care, we have created designated Provider Care Teams.  These Care Teams include your primary Cardiologist (physician) and Advanced Practice Providers (APPs -  Physician Assistants and Nurse Practitioners) who all work together to provide you with the care you need, when you need it.  We recommend signing up for the patient portal called "MyChart".  Sign up information is provided on this After Visit Summary.  MyChart is used to connect with patients for Virtual Visits (Telemedicine).  Patients are able to view lab/test results, encounter notes, upcoming appointments, etc.  Non-urgent messages can be sent to your provider as well.   To learn more about what you can do with MyChart, go to [email or web address].    Your next appointment:  Dr. Harl Bowie as scheduled   The format for your next appointment:   [place]

## 2020-03-18 LAB — CUP PACEART INCLINIC DEVICE CHECK
Date Time Interrogation Session: 20220308084536
Implantable Pulse Generator Implant Date: 20150618
Pulse Gen Model: 1010
Pulse Gen Serial Number: 111255

## 2020-03-19 ENCOUNTER — Telehealth: Payer: Self-pay | Admitting: Physician Assistant

## 2020-03-19 DIAGNOSIS — Z992 Dependence on renal dialysis: Secondary | ICD-10-CM | POA: Diagnosis not present

## 2020-03-19 DIAGNOSIS — N2581 Secondary hyperparathyroidism of renal origin: Secondary | ICD-10-CM | POA: Diagnosis not present

## 2020-03-19 DIAGNOSIS — D509 Iron deficiency anemia, unspecified: Secondary | ICD-10-CM | POA: Diagnosis not present

## 2020-03-19 DIAGNOSIS — E209 Hypoparathyroidism, unspecified: Secondary | ICD-10-CM | POA: Diagnosis not present

## 2020-03-19 DIAGNOSIS — N186 End stage renal disease: Secondary | ICD-10-CM | POA: Diagnosis not present

## 2020-03-19 DIAGNOSIS — D631 Anemia in chronic kidney disease: Secondary | ICD-10-CM | POA: Diagnosis not present

## 2020-03-19 NOTE — Telephone Encounter (Signed)
error 

## 2020-03-20 DIAGNOSIS — J309 Allergic rhinitis, unspecified: Secondary | ICD-10-CM

## 2020-03-20 DIAGNOSIS — I5022 Chronic systolic (congestive) heart failure: Secondary | ICD-10-CM | POA: Diagnosis not present

## 2020-03-20 DIAGNOSIS — D631 Anemia in chronic kidney disease: Secondary | ICD-10-CM | POA: Diagnosis not present

## 2020-03-20 DIAGNOSIS — E1122 Type 2 diabetes mellitus with diabetic chronic kidney disease: Secondary | ICD-10-CM | POA: Diagnosis not present

## 2020-03-20 DIAGNOSIS — Z992 Dependence on renal dialysis: Secondary | ICD-10-CM

## 2020-03-20 DIAGNOSIS — M1990 Unspecified osteoarthritis, unspecified site: Secondary | ICD-10-CM | POA: Diagnosis not present

## 2020-03-20 DIAGNOSIS — Z8701 Personal history of pneumonia (recurrent): Secondary | ICD-10-CM

## 2020-03-20 DIAGNOSIS — N186 End stage renal disease: Secondary | ICD-10-CM | POA: Diagnosis not present

## 2020-03-20 DIAGNOSIS — Z89511 Acquired absence of right leg below knee: Secondary | ICD-10-CM

## 2020-03-20 DIAGNOSIS — E1140 Type 2 diabetes mellitus with diabetic neuropathy, unspecified: Secondary | ICD-10-CM | POA: Diagnosis not present

## 2020-03-20 DIAGNOSIS — Z89512 Acquired absence of left leg below knee: Secondary | ICD-10-CM

## 2020-03-20 DIAGNOSIS — J449 Chronic obstructive pulmonary disease, unspecified: Secondary | ICD-10-CM | POA: Diagnosis not present

## 2020-03-20 DIAGNOSIS — Z4781 Encounter for orthopedic aftercare following surgical amputation: Secondary | ICD-10-CM | POA: Diagnosis not present

## 2020-03-20 DIAGNOSIS — E785 Hyperlipidemia, unspecified: Secondary | ICD-10-CM | POA: Diagnosis not present

## 2020-03-20 DIAGNOSIS — E11649 Type 2 diabetes mellitus with hypoglycemia without coma: Secondary | ICD-10-CM | POA: Diagnosis not present

## 2020-03-20 DIAGNOSIS — I132 Hypertensive heart and chronic kidney disease with heart failure and with stage 5 chronic kidney disease, or end stage renal disease: Secondary | ICD-10-CM | POA: Diagnosis not present

## 2020-03-20 DIAGNOSIS — E1165 Type 2 diabetes mellitus with hyperglycemia: Secondary | ICD-10-CM | POA: Diagnosis not present

## 2020-03-21 DIAGNOSIS — D509 Iron deficiency anemia, unspecified: Secondary | ICD-10-CM | POA: Diagnosis not present

## 2020-03-21 DIAGNOSIS — N2581 Secondary hyperparathyroidism of renal origin: Secondary | ICD-10-CM | POA: Diagnosis not present

## 2020-03-21 DIAGNOSIS — E209 Hypoparathyroidism, unspecified: Secondary | ICD-10-CM | POA: Diagnosis not present

## 2020-03-21 DIAGNOSIS — N186 End stage renal disease: Secondary | ICD-10-CM | POA: Diagnosis not present

## 2020-03-21 DIAGNOSIS — Z992 Dependence on renal dialysis: Secondary | ICD-10-CM | POA: Diagnosis not present

## 2020-03-21 DIAGNOSIS — D631 Anemia in chronic kidney disease: Secondary | ICD-10-CM | POA: Diagnosis not present

## 2020-03-23 ENCOUNTER — Ambulatory Visit (INDEPENDENT_AMBULATORY_CARE_PROVIDER_SITE_OTHER): Payer: Medicare Other | Admitting: Physician Assistant

## 2020-03-23 ENCOUNTER — Encounter: Payer: Self-pay | Admitting: Orthopedic Surgery

## 2020-03-23 ENCOUNTER — Other Ambulatory Visit: Payer: Self-pay

## 2020-03-23 DIAGNOSIS — Z89512 Acquired absence of left leg below knee: Secondary | ICD-10-CM

## 2020-03-23 DIAGNOSIS — S88119A Complete traumatic amputation at level between knee and ankle, unspecified lower leg, initial encounter: Secondary | ICD-10-CM

## 2020-03-23 NOTE — Progress Notes (Signed)
Office Visit Note   Patient: Anthony Bullock           Date of Birth: 06-Jun-1960           MRN: [id number] Visit Date: 03/23/2020              Requested by: [name], MD [place],  [place] [address] PCP: [name], MD  Chief Complaint  Patient presents with  . Left Leg - Follow-up    11/13/19 left BKA       HPI: Patient presents today status post left below-knee amputation he is ready for prosthetic and has an appointment today.  He thinks he was provided a prescription but somehow it has gotten lost here for a new prescription today  Assessment & Plan: Visit Diagnoses: No diagnosis found.  Plan: New prescription was provided for the patient he will follow-up in a month  Follow-Up Instructions: No follow-ups on file.   Ortho Exam  Patient is alert, oriented, no adenopathy, well-dressed, normal affect, normal respiratory effort. Left below-knee amputation stump is well-healed swelling is well controlled no erythema no swelling no signs of infection  Patient is a new left transtibial  amputee.  Patient's current comorbidities are not expected to impact the ability to function with the prescribed prosthesis. Patient verbally communicates a strong desire to use a prosthesis. Patient currently requires mobility aids to ambulate without a prosthesis.  Expects not to use mobility aids with a new prosthesis.  Patient is a K3 level ambulator that spends a lot of time walking around on uneven terrain over obstacles, up and down stairs, and ambulates with a variable cadence.    Imaging: No results found. No images are attached to the encounter.  Labs: Lab Results  Component Value Date   HGBA1C 6.5 (H) 11/13/2019   HGBA1C 8.1 (H) 07/10/2019   HGBA1C 8.9 (H) 08/06/2018   ESRSEDRATE 94 (H) 03/30/2016   ESRSEDRATE 18 11/30/2012   CRP 16.1 (H) 03/30/2016   REPTSTATUS 09/08/2019 FINAL 09/03/2019   GRAMSTAIN  03/31/2016    RARE WBC PRESENT,  PREDOMINANTLY PMN NO ORGANISMS SEEN    CULT  09/03/2019    NO GROWTH 5 DAYS Performed at [facility], [address], [place],  [address]    [place] 03/31/2016   LABORGA ENTEROBACTER SPECIES 03/31/2016     Lab Results  Component Value Date   ALBUMIN 2.8 (L) 11/28/2019   ALBUMIN 2.8 (L) 11/26/2019   ALBUMIN 2.8 (L) 11/26/2019    Lab Results  Component Value Date   MG 2.1 09/04/2019   MG 1.7 08/18/2019   MG 2.1 12/03/2014   No results found for: VD25OH  No results found for: PREALBUMIN CBC EXTENDED Latest Ref Rng & Units 11/28/2019 11/26/2019 11/23/2019  WBC 4.0 - 10.5 K/uL 8.0 7.7 5.9  RBC 4.22 - 5.81 MIL/uL 3.47(L) 3.41(L) 3.57(L)  HGB 13.0 - 17.0 g/dL 10.0(L) 9.7(L) 10.1(L)  HCT 39.0 - 52.0 % 32.1(L) 31.1(L) 32.9(L)  PLT 150 - 400 K/uL 148(L) 134(L) 165  NEUTROABS 1.7 - 7.7 K/uL - 5.1 -  LYMPHSABS 0.7 - 4.0 K/uL - 1.2 -     There is no height or weight on file to calculate BMI.  Orders:  No orders of the defined types were placed in this encounter.  No orders of the defined types were placed in this encounter.    Procedures: No procedures performed  Clinical Data: No additional findings.  ROS:  All other systems negative, except as noted in the HPI. Review of Systems  Objective: Vital Signs: There were no vitals taken for this visit.  Specialty Comments:  No specialty comments available.  PMFS History: Patient Active Problem List   Diagnosis Date Noted  . Malnutrition of moderate degree 11/18/2019  . Below-knee amputation of left lower extremity ([place]) 11/16/2019  . Abscess of left foot 11/13/2019  . Dehiscence of amputation stump ([place])   . History of complete ray amputation of fifth toe of left foot ([place]) 10/09/2019  . Acute pulmonary edema ([facility])   . Osteomyelitis of fifth toe of left foot ([place])   . Febrile illness 08/17/2019  . SIRS (systemic inflammatory response syndrome) ([place]) 08/17/2019  .  Thrombocytopenia ([place]) 08/17/2019  . Pressure injury of back, stage 2 ([place]) 08/17/2019  . Hypertensive emergency 07/10/2019  . Onychomycosis 08/16/2016  . Osteopathy in diseases classified elsewhere, unspecified site 05/14/2016  . Status post unilateral below knee amputation, right ([place]) 04/26/2016  . Bilateral carotid bruits 03/30/2016  . Diabetes mellitus with complication ([place])   . Anemia due to chronic kidney disease   . ESRD on dialysis ([place]) 11/30/2015  . Acute respiratory failure with hypoxia ([place]) 11/29/2015  . Dependence on renal dialysis ([place]) 08/15/2015  . Presence of automatic (implantable) cardiac defibrillator 03/12/2015  . At high risk for falls 09/29/2014  . Peripheral vascular disease ([place]) 09/29/2014  . Osteoarthritis of both knees 01/07/2014  . Osteoarthritis of multiple joints 10/06/2013  . NICM (nonischemic cardiomyopathy) ([place]) 06/27/2013  . CAD (coronary artery disease) 03/13/2013  . Abnormal nuclear stress test 01/27/2013  . Chronic systolic congestive heart failure ([place]) 01/27/2013  . COPD (chronic obstructive pulmonary disease) ([place]) 12/27/2012  . Diabetic peripheral neuropathy ([place]) 02/10/2012  . Urethral stricture 10/06/2010  . URINARY CALCULUS 09/28/2009  . Adjustment disorder with depressed mood 08/21/2009  . Poorly controlled type II diabetes mellitus with renal complication ([place]) 74/25/9563  . Hyperlipidemia 04/14/2008  . Essential hypertension 04/14/2008  . Allergic rhinitis 04/14/2008   Past Medical History:  Diagnosis Date  . 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 Allergic rhinitis   . Anemia   . Arthritis   . Chronic systolic heart failure ([place])    a. ECHO (12/2012) EF 25-30%, HK entireanteroseptal myocardium //  b.  EF 25%, diffuse HK, grade 1 diastolic dysfunction, MAC, mild LAE, normal RVSF, trivial pericardial effusion  . COPD (chronic obstructive pulmonary disease) ([place])   . Diabetes mellitus type  II   . Diabetic nephropathy ([place])   . Diabetic neuropathy ([place])   . ESRD on hemodialysis [facility])    started HD June 2017, goes to [facility] HD unit, Dr Hinda Lenis  . History of cardiac catheterization    a.Myoview 1/15:  There is significant left ventricular dysfunction. There may be slight scar at the apex. There is no significant ischemia. LV Ejection Fraction: 27%  //  b. RHC/LHC (1/15) with mean RA 6, PA 47/22 mean 33, mean PCWP 20, PVR 2.5 WU, CI 2.5; 80% dLAD stenosis, 70% diffuse large D.   . History of kidney stones   . Hyperlipidemia   . Hypertension   . Kidney stones   . NICM (nonischemic cardiomyopathy) ([place])    Primarily nonischemic. Echo (12/14) with EF 25-30%. Echo (3/15) with EF 25%, mild to moderately dilated LV, normal RV size and systolic function.   . Osteomyelitis ([place])    left fifth ray  . Pneumonia   .  Urethral stricture   . Wears glasses     Family History  Problem Relation Age of Onset  . Bladder Cancer Mother   . Alcohol abuse Father   . Melanoma Father   . Stroke Maternal Grandmother   . Heart Problems Maternal Grandmother        unknown  . Diabetes Maternal Grandmother   . Heart disease Maternal Grandfather   . Prostate cancer Maternal Grandfather     Past Surgical History:  Procedure Laterality Date  . ABDOMINAL AORTOGRAM W/LOWER EXTREMITY N/A 03/30/2016   Procedure: Abdominal Aortogram w/Lower Extremity;  Surgeon: Angelia Mould, MD;  Location: [place] [facility];  Service: Cardiovascular;  Laterality: N/A;  . AMPUTATION Right 04/26/2016   Procedure: Right Below Knee Amputation;  Surgeon: Newt Minion, MD;  Location: [place];  Service: Orthopedics;  Laterality: Right;  . AMPUTATION Left 08/21/2019   Procedure: LEFT FOOT 5TH RAY AMPUTATION;  Surgeon: Newt Minion, MD;  Location: [place];  Service: Orthopedics;  Laterality: Left;  . AMPUTATION Left 11/13/2019   Procedure: LEFT BELOW KNEE AMPUTATION;  Surgeon: Newt Minion, MD;  Location: [place];   Service: Orthopedics;  Laterality: Left;  . AV FISTULA PLACEMENT Right 09/08/2015   Procedure: INSERTION OF 4-7m x 45cm  ARTERIOVENOUS (AV) GORE-TEX GRAFT RIGHT UPPER  ARM;  Surgeon: CAngelia Mould MD;  Location[place]  Service: Vascular;  Laterality: Right;  . AV FISTULA PLACEMENT Left 01/14/2016   Procedure: CREATION OF LEFT UPPER ARM ARTERIOVENOUS FISTULA;  Surgeon: CAngelia Mould MD;  [place]  Service: Vascular;  Laterality: Left;  . BASCILIC VEIN TRANSPOSITION Right 08/22/2014   Procedure: RIGHT UPPER ARM BASCILIC VEIN TRANSPOSITION;  Surgeon: CAngelia Mould MD;  [place]  Service: Vascular;  Laterality: Right;  . BELOW KNEE LEG AMPUTATION Right 04/26/2016  . CARDIAC CATHETERIZATION    . CARDIAC DEFIBRILLATOR PLACEMENT  06/27/2013   Sub Q       BY DR KCaryl Comes . CATARACT EXTRACTION W/PHACO Right 08/06/2018   Procedure: CATARACT EXTRACTION PHACO AND INTRAOCULAR LENS PLACEMENT (IOC);  Surgeon: WBaruch Goldmann MD;  Location[facility];  Service: Ophthalmology;  Laterality: Right;  CDE: 4.06  . CATARACT EXTRACTION W/PHACO Left 08/20/2018   Procedure: CATARACT EXTRACTION PHACO AND INTRAOCULAR LENS PLACEMENT (IOC);  Surgeon: WBaruch Goldmann MD;  Location: [facility];  Service: Ophthalmology;  Laterality: Left;  CDE: 6.76  . COLONOSCOPY WITH PROPOFOL N/A 07/22/2015   Procedure: COLONOSCOPY WITH PROPOFOL;  Surgeon: HDoran Stabler MD;  [facility];  Service: Gastroenterology;  Laterality: N/A;  . FEMORAL-POPLITEAL BYPASS GRAFT Right 03/31/2016   Procedure: BYPASS GRAFT FEMORAL-POPLITEAL ARTERY USING RIGHT GREATER SAPHENOUS NONREVERSED VEIN;  Surgeon: CAngelia Mould MD;  Location: [facility]  Service: Vascular;  Laterality: Right;  . HERNIA REPAIR    . I & D EXTREMITY Right 03/31/2016   Procedure: IRRIGATION AND DEBRIDEMENT FOOT;  Surgeon: CAngelia Mould MD;  [place]  Service: Vascular;  Laterality: Right;  . IMPLANTABLE CARDIOVERTER  DEFIBRILLATOR IMPLANT N/A 06/27/2013   Procedure: SUB Q ICD;  Surgeon: SDeboraha Sprang MD;  [facility];  Service: Cardiovascular;  Laterality: N/A;  . INTRAOPERATIVE ARTERIOGRAM Right 03/31/2016   Procedure: INTRA OPERATIVE ARTERIOGRAM;  Surgeon: CAngelia Mould MD;  [place] [facility]  Service: Vascular;  Laterality: Right;  . IR GENERIC HISTORICAL Right 11/30/2015   IR THROMBECTOMY AV FISTULA W/THROMBOLYSIS/PTA INC/SHUNT/IMG RIGHT 11/30/2015 GAletta Edouard MD [facility]  . IR GENERIC HISTORICAL  11/30/2015   IR US GUIDE VASC ACCESS RIGHT 11/30/2015 Aletta Edouard, MD [facility]  . IR GENERIC HISTORICAL Right 12/15/2015   IR THROMBECTOMY AV FISTULA W/THROMBOLYSIS/PTA/STENT INC/SHUNT/IMG RT 12/15/2015 Arne Cleveland, MD [facility]  . IR GENERIC HISTORICAL  12/15/2015   IR US GUIDE VASC ACCESS RIGHT 12/15/2015 Arne Cleveland, MD [facility]  . IR GENERIC HISTORICAL  12/28/2015   IR FLUORO GUIDE CV LINE RIGHT 12/28/2015 Marybelle Killings, MD [facility]  . IR GENERIC HISTORICAL  12/28/2015   IR US GUIDE VASC ACCESS RIGHT 12/28/2015 Marybelle Killings, MD [facility]  . LEFT A ND RIGHT HEART CATH  01/30/2013   DR Sung Amabile  . LEFT AND RIGHT HEART CATHETERIZATION WITH CORONARY ANGIOGRAM N/A 01/30/2013   Procedure: LEFT AND RIGHT HEART CATHETERIZATION WITH CORONARY ANGIOGRAM;  Surgeon: Jolaine Artist, MD;  Location: [facility] LAB;  Service: Cardiovascular;  Laterality: N/A;  . PERIPHERAL VASCULAR CATHETERIZATION Right 01/26/2015   Procedure: A/V Fistulagram;  Surgeon: Angelia Mould, MD;  Location: [place] [facility];  Service: Cardiovascular;  Laterality: Right;  . reapea urethral surgery for recurrent obstruction  2011  . TOTAL KNEE ARTHROPLASTY Right 2007  . VEIN HARVEST Right 03/31/2016   Procedure: RIGHT GREATER SAPHENOUS VEIN HARVEST;  Surgeon: Angelia Mould, MD;  Location: [place];  Service: Vascular;  Laterality: Right;   Social History   Occupational History   . Not on file  Tobacco Use  . Smoking status: Former Smoker    Packs/day: 2.00    Years: 32.00    Pack years: 64.00    Types: Cigarettes    Quit date: 05/11/2010    Years since quitting: 9.8  . Smokeless tobacco: Never Used  Vaping Use  . Vaping Use: Never used  Substance and Sexual Activity  . Alcohol use: No  . Drug use: No  . Sexual activity: Yes

## 2020-03-24 DIAGNOSIS — Z992 Dependence on renal dialysis: Secondary | ICD-10-CM | POA: Diagnosis not present

## 2020-03-24 DIAGNOSIS — E209 Hypoparathyroidism, unspecified: Secondary | ICD-10-CM | POA: Diagnosis not present

## 2020-03-24 DIAGNOSIS — N186 End stage renal disease: Secondary | ICD-10-CM | POA: Diagnosis not present

## 2020-03-24 DIAGNOSIS — D509 Iron deficiency anemia, unspecified: Secondary | ICD-10-CM | POA: Diagnosis not present

## 2020-03-24 DIAGNOSIS — N2581 Secondary hyperparathyroidism of renal origin: Secondary | ICD-10-CM | POA: Diagnosis not present

## 2020-03-24 DIAGNOSIS — D631 Anemia in chronic kidney disease: Secondary | ICD-10-CM | POA: Diagnosis not present

## 2020-03-26 DIAGNOSIS — D509 Iron deficiency anemia, unspecified: Secondary | ICD-10-CM | POA: Diagnosis not present

## 2020-03-26 DIAGNOSIS — N2581 Secondary hyperparathyroidism of renal origin: Secondary | ICD-10-CM | POA: Diagnosis not present

## 2020-03-26 DIAGNOSIS — D631 Anemia in chronic kidney disease: Secondary | ICD-10-CM | POA: Diagnosis not present

## 2020-03-26 DIAGNOSIS — N186 End stage renal disease: Secondary | ICD-10-CM | POA: Diagnosis not present

## 2020-03-26 DIAGNOSIS — Z992 Dependence on renal dialysis: Secondary | ICD-10-CM | POA: Diagnosis not present

## 2020-03-26 DIAGNOSIS — E209 Hypoparathyroidism, unspecified: Secondary | ICD-10-CM | POA: Diagnosis not present

## 2020-03-28 DIAGNOSIS — N186 End stage renal disease: Secondary | ICD-10-CM | POA: Diagnosis not present

## 2020-03-28 DIAGNOSIS — D631 Anemia in chronic kidney disease: Secondary | ICD-10-CM | POA: Diagnosis not present

## 2020-03-28 DIAGNOSIS — D509 Iron deficiency anemia, unspecified: Secondary | ICD-10-CM | POA: Diagnosis not present

## 2020-03-28 DIAGNOSIS — E209 Hypoparathyroidism, unspecified: Secondary | ICD-10-CM | POA: Diagnosis not present

## 2020-03-28 DIAGNOSIS — Z992 Dependence on renal dialysis: Secondary | ICD-10-CM | POA: Diagnosis not present

## 2020-03-28 DIAGNOSIS — N2581 Secondary hyperparathyroidism of renal origin: Secondary | ICD-10-CM | POA: Diagnosis not present

## 2020-03-31 DIAGNOSIS — D509 Iron deficiency anemia, unspecified: Secondary | ICD-10-CM | POA: Diagnosis not present

## 2020-03-31 DIAGNOSIS — D631 Anemia in chronic kidney disease: Secondary | ICD-10-CM | POA: Diagnosis not present

## 2020-03-31 DIAGNOSIS — N186 End stage renal disease: Secondary | ICD-10-CM | POA: Diagnosis not present

## 2020-03-31 DIAGNOSIS — N2581 Secondary hyperparathyroidism of renal origin: Secondary | ICD-10-CM | POA: Diagnosis not present

## 2020-03-31 DIAGNOSIS — Z992 Dependence on renal dialysis: Secondary | ICD-10-CM | POA: Diagnosis not present

## 2020-03-31 DIAGNOSIS — E209 Hypoparathyroidism, unspecified: Secondary | ICD-10-CM | POA: Diagnosis not present

## 2020-04-02 DIAGNOSIS — N186 End stage renal disease: Secondary | ICD-10-CM | POA: Diagnosis not present

## 2020-04-02 DIAGNOSIS — Z992 Dependence on renal dialysis: Secondary | ICD-10-CM | POA: Diagnosis not present

## 2020-04-02 DIAGNOSIS — D509 Iron deficiency anemia, unspecified: Secondary | ICD-10-CM | POA: Diagnosis not present

## 2020-04-02 DIAGNOSIS — D631 Anemia in chronic kidney disease: Secondary | ICD-10-CM | POA: Diagnosis not present

## 2020-04-02 DIAGNOSIS — E209 Hypoparathyroidism, unspecified: Secondary | ICD-10-CM | POA: Diagnosis not present

## 2020-04-02 DIAGNOSIS — N2581 Secondary hyperparathyroidism of renal origin: Secondary | ICD-10-CM | POA: Diagnosis not present

## 2020-04-03 ENCOUNTER — Telehealth: Payer: Self-pay | Admitting: Family Medicine

## 2020-04-03 NOTE — Telephone Encounter (Signed)
Patient is calling and stated that he was taking Gabapentin for the nerve pain in his feet but has had a double amputation and wanted to see if he should discontinue medication, please advise. CB is [phone]

## 2020-04-03 NOTE — Telephone Encounter (Signed)
Current med list states he is taking gabapentin 100 mg in the morning and 200 mg at night.  At this low dose can taper fairly quickly.  Would go to 100 mg in the morning and 100 at night for a few days and then continue tapering off 100 mg every 3 to 4 days until off

## 2020-04-03 NOTE — Telephone Encounter (Signed)
Individuals can still have phantom pain but I do think this would be reasonable to try to taper off the gabapentin at this time.

## 2020-04-03 NOTE — Telephone Encounter (Signed)
I spoke with pt, we went over the taper instructions & he verbalized understanding.

## 2020-04-03 NOTE — Telephone Encounter (Signed)
I spoke with pt. He would like to taper off; how does he need to taper?

## 2020-04-03 NOTE — Telephone Encounter (Signed)
I left pt a voicemail with the information below & advised him I would add it to his mychart & he can call back with any questions.

## 2020-04-04 DIAGNOSIS — E209 Hypoparathyroidism, unspecified: Secondary | ICD-10-CM | POA: Diagnosis not present

## 2020-04-04 DIAGNOSIS — N186 End stage renal disease: Secondary | ICD-10-CM | POA: Diagnosis not present

## 2020-04-04 DIAGNOSIS — D631 Anemia in chronic kidney disease: Secondary | ICD-10-CM | POA: Diagnosis not present

## 2020-04-04 DIAGNOSIS — N2581 Secondary hyperparathyroidism of renal origin: Secondary | ICD-10-CM | POA: Diagnosis not present

## 2020-04-04 DIAGNOSIS — Z992 Dependence on renal dialysis: Secondary | ICD-10-CM | POA: Diagnosis not present

## 2020-04-04 DIAGNOSIS — D509 Iron deficiency anemia, unspecified: Secondary | ICD-10-CM | POA: Diagnosis not present

## 2020-04-07 ENCOUNTER — Ambulatory Visit: Payer: Medicare Other | Admitting: Orthopedic Surgery

## 2020-04-07 DIAGNOSIS — D631 Anemia in chronic kidney disease: Secondary | ICD-10-CM | POA: Diagnosis not present

## 2020-04-07 DIAGNOSIS — E209 Hypoparathyroidism, unspecified: Secondary | ICD-10-CM | POA: Diagnosis not present

## 2020-04-07 DIAGNOSIS — Z992 Dependence on renal dialysis: Secondary | ICD-10-CM | POA: Diagnosis not present

## 2020-04-07 DIAGNOSIS — D509 Iron deficiency anemia, unspecified: Secondary | ICD-10-CM | POA: Diagnosis not present

## 2020-04-07 DIAGNOSIS — N2581 Secondary hyperparathyroidism of renal origin: Secondary | ICD-10-CM | POA: Diagnosis not present

## 2020-04-07 DIAGNOSIS — N186 End stage renal disease: Secondary | ICD-10-CM | POA: Diagnosis not present

## 2020-04-09 DIAGNOSIS — D631 Anemia in chronic kidney disease: Secondary | ICD-10-CM | POA: Diagnosis not present

## 2020-04-09 DIAGNOSIS — D509 Iron deficiency anemia, unspecified: Secondary | ICD-10-CM | POA: Diagnosis not present

## 2020-04-09 DIAGNOSIS — N2581 Secondary hyperparathyroidism of renal origin: Secondary | ICD-10-CM | POA: Diagnosis not present

## 2020-04-09 DIAGNOSIS — Z992 Dependence on renal dialysis: Secondary | ICD-10-CM | POA: Diagnosis not present

## 2020-04-09 DIAGNOSIS — E209 Hypoparathyroidism, unspecified: Secondary | ICD-10-CM | POA: Diagnosis not present

## 2020-04-09 DIAGNOSIS — N186 End stage renal disease: Secondary | ICD-10-CM | POA: Diagnosis not present

## 2020-04-10 ENCOUNTER — Other Ambulatory Visit: Payer: Self-pay | Admitting: Family Medicine

## 2020-04-11 DIAGNOSIS — N186 End stage renal disease: Secondary | ICD-10-CM | POA: Diagnosis not present

## 2020-04-11 DIAGNOSIS — E559 Vitamin D deficiency, unspecified: Secondary | ICD-10-CM | POA: Diagnosis not present

## 2020-04-11 DIAGNOSIS — E209 Hypoparathyroidism, unspecified: Secondary | ICD-10-CM | POA: Diagnosis not present

## 2020-04-11 DIAGNOSIS — D509 Iron deficiency anemia, unspecified: Secondary | ICD-10-CM | POA: Diagnosis not present

## 2020-04-11 DIAGNOSIS — Z992 Dependence on renal dialysis: Secondary | ICD-10-CM | POA: Diagnosis not present

## 2020-04-11 DIAGNOSIS — N2581 Secondary hyperparathyroidism of renal origin: Secondary | ICD-10-CM | POA: Diagnosis not present

## 2020-04-14 DIAGNOSIS — D509 Iron deficiency anemia, unspecified: Secondary | ICD-10-CM | POA: Diagnosis not present

## 2020-04-14 DIAGNOSIS — E209 Hypoparathyroidism, unspecified: Secondary | ICD-10-CM | POA: Diagnosis not present

## 2020-04-14 DIAGNOSIS — E559 Vitamin D deficiency, unspecified: Secondary | ICD-10-CM | POA: Diagnosis not present

## 2020-04-14 DIAGNOSIS — Z992 Dependence on renal dialysis: Secondary | ICD-10-CM | POA: Diagnosis not present

## 2020-04-14 DIAGNOSIS — N2581 Secondary hyperparathyroidism of renal origin: Secondary | ICD-10-CM | POA: Diagnosis not present

## 2020-04-14 DIAGNOSIS — N186 End stage renal disease: Secondary | ICD-10-CM | POA: Diagnosis not present

## 2020-04-16 DIAGNOSIS — N186 End stage renal disease: Secondary | ICD-10-CM | POA: Diagnosis not present

## 2020-04-16 DIAGNOSIS — Z992 Dependence on renal dialysis: Secondary | ICD-10-CM | POA: Diagnosis not present

## 2020-04-16 DIAGNOSIS — N2581 Secondary hyperparathyroidism of renal origin: Secondary | ICD-10-CM | POA: Diagnosis not present

## 2020-04-16 DIAGNOSIS — E559 Vitamin D deficiency, unspecified: Secondary | ICD-10-CM | POA: Diagnosis not present

## 2020-04-16 DIAGNOSIS — D509 Iron deficiency anemia, unspecified: Secondary | ICD-10-CM | POA: Diagnosis not present

## 2020-04-16 DIAGNOSIS — E209 Hypoparathyroidism, unspecified: Secondary | ICD-10-CM | POA: Diagnosis not present

## 2020-04-18 DIAGNOSIS — D509 Iron deficiency anemia, unspecified: Secondary | ICD-10-CM | POA: Diagnosis not present

## 2020-04-18 DIAGNOSIS — E559 Vitamin D deficiency, unspecified: Secondary | ICD-10-CM | POA: Diagnosis not present

## 2020-04-18 DIAGNOSIS — Z992 Dependence on renal dialysis: Secondary | ICD-10-CM | POA: Diagnosis not present

## 2020-04-18 DIAGNOSIS — N2581 Secondary hyperparathyroidism of renal origin: Secondary | ICD-10-CM | POA: Diagnosis not present

## 2020-04-18 DIAGNOSIS — N186 End stage renal disease: Secondary | ICD-10-CM | POA: Diagnosis not present

## 2020-04-18 DIAGNOSIS — E209 Hypoparathyroidism, unspecified: Secondary | ICD-10-CM | POA: Diagnosis not present

## 2020-04-20 ENCOUNTER — Encounter: Payer: Medicare Other | Admitting: Physical Therapy

## 2020-04-21 DIAGNOSIS — N2581 Secondary hyperparathyroidism of renal origin: Secondary | ICD-10-CM | POA: Diagnosis not present

## 2020-04-21 DIAGNOSIS — N186 End stage renal disease: Secondary | ICD-10-CM | POA: Diagnosis not present

## 2020-04-21 DIAGNOSIS — Z992 Dependence on renal dialysis: Secondary | ICD-10-CM | POA: Diagnosis not present

## 2020-04-21 DIAGNOSIS — E559 Vitamin D deficiency, unspecified: Secondary | ICD-10-CM | POA: Diagnosis not present

## 2020-04-21 DIAGNOSIS — E209 Hypoparathyroidism, unspecified: Secondary | ICD-10-CM | POA: Diagnosis not present

## 2020-04-21 DIAGNOSIS — D509 Iron deficiency anemia, unspecified: Secondary | ICD-10-CM | POA: Diagnosis not present

## 2020-04-23 ENCOUNTER — Ambulatory Visit: Payer: Medicare Other | Admitting: Cardiology

## 2020-04-23 DIAGNOSIS — N186 End stage renal disease: Secondary | ICD-10-CM | POA: Diagnosis not present

## 2020-04-23 DIAGNOSIS — Z992 Dependence on renal dialysis: Secondary | ICD-10-CM | POA: Diagnosis not present

## 2020-04-23 DIAGNOSIS — E209 Hypoparathyroidism, unspecified: Secondary | ICD-10-CM | POA: Diagnosis not present

## 2020-04-23 DIAGNOSIS — E559 Vitamin D deficiency, unspecified: Secondary | ICD-10-CM | POA: Diagnosis not present

## 2020-04-23 DIAGNOSIS — D509 Iron deficiency anemia, unspecified: Secondary | ICD-10-CM | POA: Diagnosis not present

## 2020-04-23 DIAGNOSIS — N2581 Secondary hyperparathyroidism of renal origin: Secondary | ICD-10-CM | POA: Diagnosis not present

## 2020-04-25 DIAGNOSIS — D509 Iron deficiency anemia, unspecified: Secondary | ICD-10-CM | POA: Diagnosis not present

## 2020-04-25 DIAGNOSIS — N2581 Secondary hyperparathyroidism of renal origin: Secondary | ICD-10-CM | POA: Diagnosis not present

## 2020-04-25 DIAGNOSIS — E209 Hypoparathyroidism, unspecified: Secondary | ICD-10-CM | POA: Diagnosis not present

## 2020-04-25 DIAGNOSIS — N186 End stage renal disease: Secondary | ICD-10-CM | POA: Diagnosis not present

## 2020-04-25 DIAGNOSIS — E559 Vitamin D deficiency, unspecified: Secondary | ICD-10-CM | POA: Diagnosis not present

## 2020-04-25 DIAGNOSIS — Z992 Dependence on renal dialysis: Secondary | ICD-10-CM | POA: Diagnosis not present

## 2020-04-28 DIAGNOSIS — Z992 Dependence on renal dialysis: Secondary | ICD-10-CM | POA: Diagnosis not present

## 2020-04-28 DIAGNOSIS — D509 Iron deficiency anemia, unspecified: Secondary | ICD-10-CM | POA: Diagnosis not present

## 2020-04-28 DIAGNOSIS — N186 End stage renal disease: Secondary | ICD-10-CM | POA: Diagnosis not present

## 2020-04-28 DIAGNOSIS — N2581 Secondary hyperparathyroidism of renal origin: Secondary | ICD-10-CM | POA: Diagnosis not present

## 2020-04-28 DIAGNOSIS — E209 Hypoparathyroidism, unspecified: Secondary | ICD-10-CM | POA: Diagnosis not present

## 2020-04-28 DIAGNOSIS — E559 Vitamin D deficiency, unspecified: Secondary | ICD-10-CM | POA: Diagnosis not present

## 2020-04-29 ENCOUNTER — Encounter: Payer: Self-pay | Admitting: Physical Therapy

## 2020-04-29 ENCOUNTER — Other Ambulatory Visit: Payer: Self-pay

## 2020-04-29 ENCOUNTER — Ambulatory Visit (INDEPENDENT_AMBULATORY_CARE_PROVIDER_SITE_OTHER): Payer: Medicare Other | Admitting: Physical Therapy

## 2020-04-29 DIAGNOSIS — R2681 Unsteadiness on feet: Secondary | ICD-10-CM | POA: Diagnosis not present

## 2020-04-29 DIAGNOSIS — M6281 Muscle weakness (generalized): Secondary | ICD-10-CM | POA: Diagnosis not present

## 2020-04-29 DIAGNOSIS — R2689 Other abnormalities of gait and mobility: Secondary | ICD-10-CM

## 2020-04-29 NOTE — Therapy (Signed)
[facility] Physical Therapy [address] [place], [place], [address] Phone: [phone]   Fax:  [phone]  Physical Therapy Evaluation  Patient Details  Name: Anthony Bullock MRN: [id number] Date of Birth: 09-01-1960 Referring Provider (PT): Bevely [place] Persons, [place]   Encounter Date: 04/29/2020   PT End of Session - 04/29/20 1629    Visit Number 1    Number of Visits 25    Date for PT Re-Evaluation 07/23/20    Authorization Type Medicare & BCBS supplement    PT Start Time 1300    PT Stop Time 8101    PT Time Calculation (min) 55 min    Equipment Utilized During Treatment Gait belt    Activity Tolerance Patient tolerated treatment well;Patient limited by fatigue    Behavior During Therapy [facility] for tasks assessed/performed           Past Medical History:  Diagnosis Date  . 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 Allergic rhinitis   . Anemia   . Arthritis   . Chronic systolic heart failure ([place])    a. ECHO (12/2012) EF 25-30%, HK entireanteroseptal myocardium //  b.  EF 25%, diffuse HK, grade 1 diastolic dysfunction, MAC, mild LAE, normal RVSF, trivial pericardial effusion  . COPD (chronic obstructive pulmonary disease) ([place])   . Diabetes mellitus type II   . Diabetic nephropathy ([place])   . Diabetic neuropathy ([place])   . ESRD on hemodialysis [facility])    started HD June 2017, goes to [facility] HD unit, Dr Hinda Lenis  . History of cardiac catheterization    a.Myoview 1/15:  There is significant left ventricular dysfunction. There may be slight scar at the apex. There is no significant ischemia. LV Ejection Fraction: 27%  //  b. RHC/LHC (1/15) with mean RA 6, PA 47/22 mean 33, mean PCWP 20, PVR 2.5 WU, CI 2.5; 80% dLAD stenosis, 70% diffuse large D.   . History of kidney stones   . Hyperlipidemia   . Hypertension   . Kidney stones   . NICM (nonischemic cardiomyopathy) ([place])    Primarily nonischemic. Echo (12/14) with EF  25-30%. Echo (3/15) with EF 25%, mild to moderately dilated LV, normal RV size and systolic function.   . Osteomyelitis ([place])    left fifth ray  . Pneumonia   . Urethral stricture   . Wears glasses     Past Surgical History:  Procedure Laterality Date  . ABDOMINAL AORTOGRAM W/LOWER EXTREMITY N/A 03/30/2016   Procedure: Abdominal Aortogram w/Lower Extremity;  Surgeon: Angelia Mould, MD;  Location: [place] [facility];  Service: Cardiovascular;  Laterality: N/A;  . AMPUTATION Right 04/26/2016   Procedure: Right Below Knee Amputation;  Surgeon: Newt Minion, MD;  Location: [place];  Service: Orthopedics;  Laterality: Right;  . AMPUTATION Left 08/21/2019   Procedure: LEFT FOOT 5TH RAY AMPUTATION;  Surgeon: Newt Minion, MD;  Location: [place];  Service: Orthopedics;  Laterality: Left;  . AMPUTATION Left 11/13/2019   Procedure: LEFT BELOW KNEE AMPUTATION;  Surgeon: Newt Minion, MD;  Location: [place];  Service: Orthopedics;  Laterality: Left;  . AV FISTULA PLACEMENT Right 09/08/2015   Procedure: INSERTION OF 4-71m x 45cm  ARTERIOVENOUS (AV) GORE-TEX GRAFT RIGHT UPPER  ARM;  Surgeon: CAngelia Mould MD;  Location[place]  Service: Vascular;  Laterality: Right;  . AV FISTULA PLACEMENT Left 01/14/2016   Procedure: CREATION OF LEFT UPPER ARM ARTERIOVENOUS FISTULA;  Surgeon: CJudeth Cornfield  Scot Dock, MD;  Location: [place];  Service: Vascular;  Laterality: Left;  . BASCILIC VEIN TRANSPOSITION Right 08/22/2014   Procedure: RIGHT UPPER ARM BASCILIC VEIN TRANSPOSITION;  Surgeon: Angelia Mould, MD;  Location: [place];  Service: Vascular;  Laterality: Right;  . BELOW KNEE LEG AMPUTATION Right 04/26/2016  . CARDIAC CATHETERIZATION    . CARDIAC DEFIBRILLATOR PLACEMENT  06/27/2013   Sub Q       BY DR Caryl Comes  . CATARACT EXTRACTION W/PHACO Right 08/06/2018   Procedure: CATARACT EXTRACTION PHACO AND INTRAOCULAR LENS PLACEMENT (IOC);  Surgeon: Baruch Goldmann, MD;  Location: [facility];  Service:  Ophthalmology;  Laterality: Right;  CDE: 4.06  . CATARACT EXTRACTION W/PHACO Left 08/20/2018   Procedure: CATARACT EXTRACTION PHACO AND INTRAOCULAR LENS PLACEMENT (IOC);  Surgeon: Baruch Goldmann, MD;  Location: [facility];  Service: Ophthalmology;  Laterality: Left;  CDE: 6.76  . COLONOSCOPY WITH PROPOFOL N/A 07/22/2015   Procedure: COLONOSCOPY WITH PROPOFOL;  Surgeon: Doran Stabler, MD;  Location: [facility];  Service: Gastroenterology;  Laterality: N/A;  . FEMORAL-POPLITEAL BYPASS GRAFT Right 03/31/2016   Procedure: BYPASS GRAFT FEMORAL-POPLITEAL ARTERY USING RIGHT GREATER SAPHENOUS NONREVERSED VEIN;  Surgeon: Angelia Mould, MD;  Location: [place];  Service: Vascular;  Laterality: Right;  . HERNIA REPAIR    . I & D EXTREMITY Right 03/31/2016   Procedure: IRRIGATION AND DEBRIDEMENT FOOT;  Surgeon: Angelia Mould, MD;  Location: [place];  Service: Vascular;  Laterality: Right;  . IMPLANTABLE CARDIOVERTER DEFIBRILLATOR IMPLANT N/A 06/27/2013   Procedure: SUB Q ICD;  Surgeon: Deboraha Sprang, MD;  Location: [facility] C[facility] LAB;  Service: Cardiovascular;  Laterality: N/A;  . INTRAOPERATIVE ARTERIOGRAM Right 03/31/2016   Procedure: INTRA OPERATIVE ARTERIOGRAM;  Surgeon: Angelia Mould, MD;  Location: [place];  Service: Vascular;  Laterality: Right;  . IR GENERIC HISTORICAL Right 11/30/2015   IR THROMBECTOMY AV FISTULA W/THROMBOLYSIS/PTA INC/SHUNT/IMG RIGHT 11/30/2015 Aletta Edouard, MD [facility]  . IR GENERIC HISTORICAL  11/30/2015   IR US GUIDE VASC ACCESS RIGHT 11/30/2015 Aletta Edouard, MD [facility]  . IR GENERIC HISTORICAL Right 12/15/2015   IR THROMBECTOMY AV FISTULA W/THROMBOLYSIS/PTA/STENT INC/SHUNT/IMG RT 12/15/2015 Arne [place], MD [facility]  . IR GENERIC HISTORICAL  12/15/2015   IR US GUIDE VASC ACCESS RIGHT 12/15/2015 Arne [place], MD [facility]  . IR GENERIC HISTORICAL  12/28/2015   IR FLUORO GUIDE CV LINE RIGHT 12/28/2015 Marybelle Killings, MD [facility]  . IR GENERIC  HISTORICAL  12/28/2015   IR US GUIDE VASC ACCESS RIGHT 12/28/2015 Marybelle Killings, MD [facility]  . LEFT A ND RIGHT HEART CATH  01/30/2013   DR Sung Amabile  . LEFT AND RIGHT HEART CATHETERIZATION WITH CORONARY ANGIOGRAM N/A 01/30/2013   Procedure: LEFT AND RIGHT HEART CATHETERIZATION WITH CORONARY ANGIOGRAM;  Surgeon: Jolaine Artist, MD;  Location: [facility] CATH LAB;  Service: Cardiovascular;  Laterality: N/A;  . PERIPHERAL VASCULAR CATHETERIZATION Right 01/26/2015   Procedure: A/V Fistulagram;  Surgeon: Angelia Mould, MD;  Location: [place] CV [facility];  Service: Cardiovascular;  Laterality: Right;  . reapea urethral surgery for recurrent obstruction  2011  . TOTAL KNEE ARTHROPLASTY Right 2007  . VEIN HARVEST Right 03/31/2016   Procedure: RIGHT GREATER SAPHENOUS VEIN HARVEST;  Surgeon: Angelia Mould, MD;  Location: [place];  Service: Vascular;  Laterality: Right;    There were no vitals filed for this visit.    Subjective Assessment - 04/29/20 1303    Subjective (ICD-10-CM) - Below knee  amputation. He underwent a left Transtibial Amputation on 11/13/2019. History of right Transtibial Amputation 04/26/2016. His right prosthesis is orginal including no socket revisions.  He received the prosthesis 04/20/2020. His prosthetist is Staci Righter, [facility] at [facility] [employer] [facility]. He had left toe amputated in Aug 2021 and has been w/c since.    Patient is accompained by: Family member   wife, Jose Corvin   Pertinent History Left TTA, automatic cardioverter/defibrillator, arthritis, CAD, cardiomyopathy, CHF, COPD, DM2, neuropathy, ESRD, CAD w/catherization, HTN, kidney stones, right TKA 2007    Patient Stated Goals Walk in home & community.    Currently in Pain? No/denies              [facility] PT Assessment - 04/29/20 1300      Assessment   Medical Diagnosis Bilateral Transtibial Amputations    Referring Provider (PT) Bevely [place] Persons, PA    Onset Date/Surgical Date 04/20/20   left prosthesis delivery    Hand Dominance Right    Prior Therapy Inpatient rehab for 2 weeks      Precautions   Precautions Fall;ICD/Pacemaker    Precaution Comments No BP LUE      Restrictions   Weight Bearing Restrictions No      Balance Screen   Has the patient fallen in the past 6 months No    Has the patient had a decrease in activity level because of a fear of falling?  No    Is the patient reluctant to leave their home because of a fear of falling?  No      Home Environment   Living Environment Private residence    Living Arrangements Spouse/significant other   boxer 80# dog   Type of Home Mobile home    Home Access Ramped entrance   2nd entrance 5 steps 2 rails close   [place] One level    [place] - 4 wheels;Cane - single point;Bedside commode;Wheelchair - manual;Hospital bed      Prior Function   Level of Independence Independent with household mobility without device;Independent with community mobility with device   cane for community,  right prosthesis   Vocation On disability    Leisure mow with riding zero turn mower      ROM / Strength   AROM / PROM / Strength Strength      Strength   Overall Strength Comments gross testing seated - bil hips & knees 4/5      Transfers   Transfers Sit to Stand;Stand to Sit;Stand Pivot Transfers    Sit to Stand 4: Min assist;With armrests;With upper extremity assist;From elevated surface;From chair/3-in-1;Other (comment)   to RW for stabilization   Sit to Stand Details Visual cues for safe use of DME/AE;Verbal cues for safe use of DME/AE;Verbal cues for technique    Sit to Stand Details (indicate cue type and reason) PT demo & instructed in technique prior to standing.    Stand to Sit 4: Min guard;With upper extremity assist;With armrests;To elevated surface;To chair/3-in-1;Other (comment)   from RW   Stand to Sit Details (indicate cue type and reason) Visual cues for safe use of DME/AE;Verbal cues for safe use of DME/AE;Verbal cues for  technique    Stand to Sit Details PT demo & instructed in technique prior to standing.    Stand Pivot Transfers 4: Min assist;With armrests;From elevated surface   with RW & Bil. TTA prostheses   Stand Pivot Transfer Details (indicate cue type and reason) demo, verbal & tactile  cues on technique      Ambulation/Gait   Ambulation/Gait Yes    Ambulation/Gait Assistance 4: Min assist    Ambulation/Gait Assistance Details excessive UE weight bearing on RW    Ambulation Distance (Feet) 5 Feet    Assistive device Rolling walker;Prostheses    Gait Pattern Step-to pattern;Decreased step length - right;Decreased stance time - left;Decreased stride length;Decreased hip/knee flexion - right;Left flexed knee in stance;Antalgic;Lateral hip instability;Trunk flexed;Wide base of support    Ambulation Surface Level;Indoor      Balance   Balance Assessed Yes      Static Standing Balance   Static Standing - Balance Support Bilateral upper extremity supported   RW   Static Standing - Level of Assistance 5: Stand by assistance    Static Standing - Comment/# of Minutes 2 minutes      Dynamic Standing Balance   Dynamic Standing - Balance Support Bilateral upper extremity supported;Left upper extremity supported;Right upper extremity supported   RW BUE except single UE w/reaching   Dynamic Standing - Level of Assistance 3: Mod assist;4: Min assist   minA scanning & modA reaching   Dynamic Standing - Balance Activities Head nods;Head turns;Reaching for objects    Dynamic Standing - Comments scanning cervical motion mainly, looks to side only.  Reaches 5" with minA           Prosthetics Assessment - 04/29/20 1300      Prosthetics   Prosthetic Care Dependent with Skin check;Residual limb care;Care of non-amputated limb;Prosthetic cleaning;Ply sock cleaning;Correct ply sock adjustment;Proper wear schedule/adjustment;Proper weight-bearing schedule/adjustment    Donning prosthesis  Min assist    Doffing  prosthesis  Supervision    Current prosthetic wear tolerance (days/week)  unable since delivery 9 days prior to PT evaluation    Current prosthetic wear tolerance (#hours/day)  0    Current prosthetic weight-bearing tolerance (hours/day)  Patient tolerated 3 min of standing without c/o limb pain    Edema pitting    Residual limb condition  left residual limb with large dry scab laterally,dry skin, no hair growth, cylinderical shape,    Prosthesis Description bil. silicon liner with shuttle pin lock suspension, dynamice response feet.   Right prosthesis is original from 2018 which is ill fitting.                     Objective measurements completed on examination: See above findings.       [facility] Adult PT Treatment/Exercise - 04/29/20 1300      Prosthetics   Prosthetic Care Comments  PT instructed to wear prosthesis 2hrs 2x/day. PT demo & verbal cues on options for driving with bil. TTA prostheses with initial practice car turned on but in park and then middle of large empty parking lot.    Education Provided Skin check;Residual limb care;Prosthetic cleaning;Correct ply sock adjustment;Proper Donning;Proper wear schedule/adjustment;Other (comment)   see prosthetic care comments   Person(s) Educated Patient;Spouse    Education Method Explanation;Demonstration;Tactile cues;Verbal cues    Education Method Verbalized understanding;Returned demonstration;Tactile cues required;Verbal cues required;Needs further instruction                    PT Short Term Goals - 04/29/20 1644      PT SHORT TERM GOAL #1   Title Patient donnes prostheses modified independent & verbalizes proper cleaning.    Time 1    Period Months    Status New    Target Date 05/28/20  PT SHORT TERM GOAL #2   Title Patient tolerates left prosthesis >10 hrs total /day without skin issues    Time 1    Period Months    Status New    Target Date 05/28/20      PT SHORT TERM GOAL #3   Title  Patient able to reach 10" with RW support safely    Time 1    Period Months    Status New    Target Date 05/28/20      PT SHORT TERM GOAL #4   Title Patient ambulates 100' with RW & prostheses including sit / stand chair with armrests with supervision    Time 1    Period Months    Status New    Target Date 05/28/20             PT Long Term Goals - 04/29/20 1638      PT LONG TERM GOAL #1   Title Patient demonstrates & verbalized understanding of prosthetic care to enable safe utilization of prostheses.    Time 12    Period Weeks    Status New    Target Date 07/23/20      PT LONG TERM GOAL #2   Title Patient tolerates prostheses wear for >90% of awake hours without skin issues    Time 12    Period Weeks    Status New    Target Date 07/23/20      PT LONG TERM GOAL #3   Title [place] Balance >/= 36/56 to indicate lower fall risk    Time 12    Period Weeks    Status New    Target Date 07/23/20      PT LONG TERM GOAL #4   Title Patient ambulates 300' with LRAD & prostheses modified independent    Time 12    Period Weeks    Status New    Target Date 07/23/20      PT LONG TERM GOAL #5   Title Patient negotiates ramps, curbs & stairs with LRAD & prostheses modified independent.    Time 12    Period Weeks    Status New    Target Date 07/23/20                  Plan - 04/29/20 1630    Clinical Impression Statement This 60yo male underwent a left Transtibial Amputation on 11/13/2019 with history of right Transtibial Amputation 04/26/2016 and received his first prosthesis on 04/20/2020.  He is dependent in proper prosthetic care which increases risk of skin issues. He has not been able to donne prosthesis prior to PT so no wear with left prosthesis.  Patient is wearing right prosthesis most of awake hours but it is original prosthesis from 2018 with no socket revisions which is ill fitting.  Patient required skilled instruction in technique for sit to/from stand then  minimal assistance & armrests.  He requires walker support reaching 5" with minimal assist indicating high fall risk.  Patient's prosthetic gait has significant deviations including excessive UE weight bearing and requires minimal assist with only 5' distance indicating high fall risk. Patient would benefit from skilled PT to improve function & safety with his prostheses.    Personal Factors and Comorbidities Comorbidity 3+;Fitness;Time since onset of injury/illness/exacerbation    Comorbidities Left TTA, automatic cardioverter/defibrillator, arthritis, CAD, cardiomyopathy, CHF, COPD, DM2, neuropathy, ESRD, CAD w/catherization, HTN, kidney stones, right TKA 2007    Examination-Activity Limitations Lift;Locomotion Level;Squat;Stairs;Stand;Transfers  Examination-Participation Restrictions Driving;Community Activity    Stability/Clinical Decision Making Evolving/Moderate complexity    Clinical Decision Making Moderate    Rehab Potential Good    PT Frequency 2x / week    PT Duration 12 weeks    PT Treatment/Interventions ADLs/Self Care Home Management;DME Instruction;Gait training;Stair training;Functional mobility training;Therapeutic activities;Therapeutic exercise;Balance training;Neuromuscular re-education;Patient/family education;Prosthetic Training;Vestibular    PT Next Visit Plan set up HEP, review prosthetic care, prosthetic gait with RW, therapeutic exercise to increase functional strength & endurance.    Consulted and Agree with Plan of Care Patient;Family member/caregiver    Family Member Consulted wife           Patient will benefit from skilled therapeutic intervention in order to improve the following deficits and impairments:  Abnormal gait,Decreased activity tolerance,Decreased balance,Decreased endurance,Decreased knowledge of use of DME,Decreased mobility,Decreased skin integrity,Decreased strength,Increased edema,Impaired flexibility,Postural dysfunction,Prosthetic  Dependency,Pain  Visit Diagnosis: Other abnormalities of gait and mobility  Unsteadiness on feet  Muscle weakness (generalized)     Problem List Patient Active Problem List   Diagnosis Date Noted  . Malnutrition of moderate degree 11/18/2019  . Below-knee amputation of left lower extremity ([place]) 11/16/2019  . Abscess of left foot 11/13/2019  . Dehiscence of amputation stump ([place])   . History of complete ray amputation of fifth toe of left foot ([place]) 10/09/2019  . Acute pulmonary edema ([facility])   . Osteomyelitis of fifth toe of left foot ([place])   . Febrile illness 08/17/2019  . SIRS (systemic inflammatory response syndrome) ([place]) 08/17/2019  . Thrombocytopenia ([place]) 08/17/2019  . Pressure injury of back, stage 2 ([place]) 08/17/2019  . Hypertensive emergency 07/10/2019  . Onychomycosis 08/16/2016  . Osteopathy in diseases classified elsewhere, unspecified site 05/14/2016  . Status post unilateral below knee amputation, right ([place]) 04/26/2016  . Bilateral carotid bruits 03/30/2016  . Diabetes mellitus with complication ([place])   . Anemia due to chronic kidney disease   . ESRD on dialysis ([place]) 11/30/2015  . Acute respiratory failure with hypoxia ([place]) 11/29/2015  . Dependence on renal dialysis ([place]) 08/15/2015  . Presence of automatic (implantable) cardiac defibrillator 03/12/2015  . At high risk for falls 09/29/2014  . Peripheral vascular disease ([place]) 09/29/2014  . Osteoarthritis of both knees 01/07/2014  . Osteoarthritis of multiple joints 10/06/2013  . NICM (nonischemic cardiomyopathy) ([place]) 06/27/2013  . CAD (coronary artery disease) 03/13/2013  . Abnormal nuclear stress test 01/27/2013  . Chronic systolic congestive heart failure ([place]) 01/27/2013  . COPD (chronic obstructive pulmonary disease) ([place]) 12/27/2012  . Diabetic peripheral neuropathy ([place]) 02/10/2012  . Urethral stricture 10/06/2010  . URINARY CALCULUS 09/28/2009  . Adjustment disorder with depressed mood  08/21/2009  . Poorly controlled type II diabetes mellitus with renal complication ([place]) 59/56/3875  . Hyperlipidemia 04/14/2008  . Essential hypertension 04/14/2008  . Allergic rhinitis 04/14/2008    Jamey Reas, PT, DPT 04/29/2020, 4:48 PM  [facility] Physical Therapy [address] [place], [place], [address] Phone: [phone]   Fax:  [phone]  Name: Anthony Bullock MRN: [id number] Date of Birth: 04/02/1960

## 2020-04-30 DIAGNOSIS — D509 Iron deficiency anemia, unspecified: Secondary | ICD-10-CM | POA: Diagnosis not present

## 2020-04-30 DIAGNOSIS — Z992 Dependence on renal dialysis: Secondary | ICD-10-CM | POA: Diagnosis not present

## 2020-04-30 DIAGNOSIS — E559 Vitamin D deficiency, unspecified: Secondary | ICD-10-CM | POA: Diagnosis not present

## 2020-04-30 DIAGNOSIS — N186 End stage renal disease: Secondary | ICD-10-CM | POA: Diagnosis not present

## 2020-04-30 DIAGNOSIS — N2581 Secondary hyperparathyroidism of renal origin: Secondary | ICD-10-CM | POA: Diagnosis not present

## 2020-04-30 DIAGNOSIS — E209 Hypoparathyroidism, unspecified: Secondary | ICD-10-CM | POA: Diagnosis not present

## 2020-05-02 DIAGNOSIS — D509 Iron deficiency anemia, unspecified: Secondary | ICD-10-CM | POA: Diagnosis not present

## 2020-05-02 DIAGNOSIS — N186 End stage renal disease: Secondary | ICD-10-CM | POA: Diagnosis not present

## 2020-05-02 DIAGNOSIS — E559 Vitamin D deficiency, unspecified: Secondary | ICD-10-CM | POA: Diagnosis not present

## 2020-05-02 DIAGNOSIS — Z992 Dependence on renal dialysis: Secondary | ICD-10-CM | POA: Diagnosis not present

## 2020-05-02 DIAGNOSIS — N2581 Secondary hyperparathyroidism of renal origin: Secondary | ICD-10-CM | POA: Diagnosis not present

## 2020-05-02 DIAGNOSIS — E209 Hypoparathyroidism, unspecified: Secondary | ICD-10-CM | POA: Diagnosis not present

## 2020-05-05 DIAGNOSIS — N2581 Secondary hyperparathyroidism of renal origin: Secondary | ICD-10-CM | POA: Diagnosis not present

## 2020-05-05 DIAGNOSIS — Z992 Dependence on renal dialysis: Secondary | ICD-10-CM | POA: Diagnosis not present

## 2020-05-05 DIAGNOSIS — D509 Iron deficiency anemia, unspecified: Secondary | ICD-10-CM | POA: Diagnosis not present

## 2020-05-05 DIAGNOSIS — E559 Vitamin D deficiency, unspecified: Secondary | ICD-10-CM | POA: Diagnosis not present

## 2020-05-05 DIAGNOSIS — N186 End stage renal disease: Secondary | ICD-10-CM | POA: Diagnosis not present

## 2020-05-05 DIAGNOSIS — E209 Hypoparathyroidism, unspecified: Secondary | ICD-10-CM | POA: Diagnosis not present

## 2020-05-07 DIAGNOSIS — N186 End stage renal disease: Secondary | ICD-10-CM | POA: Diagnosis not present

## 2020-05-07 DIAGNOSIS — D509 Iron deficiency anemia, unspecified: Secondary | ICD-10-CM | POA: Diagnosis not present

## 2020-05-07 DIAGNOSIS — N2581 Secondary hyperparathyroidism of renal origin: Secondary | ICD-10-CM | POA: Diagnosis not present

## 2020-05-07 DIAGNOSIS — E209 Hypoparathyroidism, unspecified: Secondary | ICD-10-CM | POA: Diagnosis not present

## 2020-05-07 DIAGNOSIS — Z992 Dependence on renal dialysis: Secondary | ICD-10-CM | POA: Diagnosis not present

## 2020-05-07 DIAGNOSIS — E559 Vitamin D deficiency, unspecified: Secondary | ICD-10-CM | POA: Diagnosis not present

## 2020-05-09 DIAGNOSIS — D509 Iron deficiency anemia, unspecified: Secondary | ICD-10-CM | POA: Diagnosis not present

## 2020-05-09 DIAGNOSIS — E559 Vitamin D deficiency, unspecified: Secondary | ICD-10-CM | POA: Diagnosis not present

## 2020-05-09 DIAGNOSIS — N2581 Secondary hyperparathyroidism of renal origin: Secondary | ICD-10-CM | POA: Diagnosis not present

## 2020-05-09 DIAGNOSIS — Z992 Dependence on renal dialysis: Secondary | ICD-10-CM | POA: Diagnosis not present

## 2020-05-09 DIAGNOSIS — N186 End stage renal disease: Secondary | ICD-10-CM | POA: Diagnosis not present

## 2020-05-09 DIAGNOSIS — E209 Hypoparathyroidism, unspecified: Secondary | ICD-10-CM | POA: Diagnosis not present

## 2020-05-11 ENCOUNTER — Other Ambulatory Visit: Payer: Self-pay | Admitting: Family Medicine

## 2020-05-12 DIAGNOSIS — N2581 Secondary hyperparathyroidism of renal origin: Secondary | ICD-10-CM | POA: Diagnosis not present

## 2020-05-12 DIAGNOSIS — N186 End stage renal disease: Secondary | ICD-10-CM | POA: Diagnosis not present

## 2020-05-12 DIAGNOSIS — Z992 Dependence on renal dialysis: Secondary | ICD-10-CM | POA: Diagnosis not present

## 2020-05-12 DIAGNOSIS — E209 Hypoparathyroidism, unspecified: Secondary | ICD-10-CM | POA: Diagnosis not present

## 2020-05-12 DIAGNOSIS — D509 Iron deficiency anemia, unspecified: Secondary | ICD-10-CM | POA: Diagnosis not present

## 2020-05-12 DIAGNOSIS — D631 Anemia in chronic kidney disease: Secondary | ICD-10-CM | POA: Diagnosis not present

## 2020-05-13 ENCOUNTER — Ambulatory Visit (INDEPENDENT_AMBULATORY_CARE_PROVIDER_SITE_OTHER): Payer: Medicare Other | Admitting: Physical Therapy

## 2020-05-13 ENCOUNTER — Other Ambulatory Visit: Payer: Self-pay

## 2020-05-13 DIAGNOSIS — R2689 Other abnormalities of gait and mobility: Secondary | ICD-10-CM | POA: Diagnosis not present

## 2020-05-13 DIAGNOSIS — M6281 Muscle weakness (generalized): Secondary | ICD-10-CM | POA: Diagnosis not present

## 2020-05-13 DIAGNOSIS — R2681 Unsteadiness on feet: Secondary | ICD-10-CM | POA: Diagnosis not present

## 2020-05-13 NOTE — Therapy (Signed)
[facility] Physical Therapy [address] [place], [place], [address] Phone: [phone]   Fax:  [phone]  Physical Therapy Treatment  Patient Details  Name: Anthony Bullock MRN: [id number] Date of Birth: 1960-07-03 Referring Provider (PT): Bevely Palmer Persons, [place]   Encounter Date: 05/13/2020   PT End of Session - 05/13/20 1043    Visit Number 2    Number of Visits 25    Date for PT Re-Evaluation 07/23/20    Authorization Type Medicare & BCBS supplement    PT Start Time 1005    PT Stop Time 1047    PT Time Calculation (min) 42 min    Equipment Utilized During Treatment Gait belt    Activity Tolerance Patient tolerated treatment well;Patient limited by fatigue    Behavior During Therapy [facility] for tasks assessed/performed           Past Medical History:  Diagnosis Date  . 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 Allergic rhinitis   . Anemia   . Arthritis   . Chronic systolic heart failure ([place])    a. ECHO (12/2012) EF 25-30%, HK entireanteroseptal myocardium //  b.  EF 25%, diffuse HK, grade 1 diastolic dysfunction, MAC, mild LAE, normal RVSF, trivial pericardial effusion  . COPD (chronic obstructive pulmonary disease) ([place])   . Diabetes mellitus type II   . Diabetic nephropathy ([place])   . Diabetic neuropathy ([place])   . ESRD on hemodialysis [facility])    started HD June 2017, goes to [facility] HD unit, Dr Hinda Lenis  . History of cardiac catheterization    a.Myoview 1/15:  There is significant left ventricular dysfunction. There may be slight scar at the apex. There is no significant ischemia. LV Ejection Fraction: 27%  //  b. RHC/LHC (1/15) with mean RA 6, PA 47/22 mean 33, mean PCWP 20, PVR 2.5 WU, CI 2.5; 80% dLAD stenosis, 70% diffuse large D.   . History of kidney stones   . Hyperlipidemia   . Hypertension   . Kidney stones   . NICM (nonischemic cardiomyopathy) ([place])    Primarily nonischemic. Echo (12/14) with EF  25-30%. Echo (3/15) with EF 25%, mild to moderately dilated LV, normal RV size and systolic function.   . Osteomyelitis ([place])    left fifth ray  . Pneumonia   . Urethral stricture   . Wears glasses     Past Surgical History:  Procedure Laterality Date  . ABDOMINAL AORTOGRAM W/LOWER EXTREMITY N/A 03/30/2016   Procedure: Abdominal Aortogram w/Lower Extremity;  Surgeon: Angelia Mould, MD;  Location: [place] [facility];  Service: Cardiovascular;  Laterality: N/A;  . AMPUTATION Right 04/26/2016   Procedure: Right Below Knee Amputation;  Surgeon: Newt Minion, MD;  Location: [place];  Service: Orthopedics;  Laterality: Right;  . AMPUTATION Left 08/21/2019   Procedure: LEFT FOOT 5TH RAY AMPUTATION;  Surgeon: Newt Minion, MD;  Location: [place];  Service: Orthopedics;  Laterality: Left;  . AMPUTATION Left 11/13/2019   Procedure: LEFT BELOW KNEE AMPUTATION;  Surgeon: Newt Minion, MD;  Location: [place];  Service: Orthopedics;  Laterality: Left;  . AV FISTULA PLACEMENT Right 09/08/2015   Procedure: INSERTION OF 4-74m x 45cm  ARTERIOVENOUS (AV) GORE-TEX GRAFT RIGHT UPPER  ARM;  Surgeon: CAngelia Mould MD;  Location: [place]  Service: Vascular;  Laterality: Right;  . AV FISTULA PLACEMENT Left 01/14/2016   Procedure: CREATION OF LEFT UPPER ARM ARTERIOVENOUS FISTULA;  Surgeon: CJudeth Cornfield  Scot Dock, MD;  Location: [place];  Service: Vascular;  Laterality: Left;  . BASCILIC VEIN TRANSPOSITION Right 08/22/2014   Procedure: RIGHT UPPER ARM BASCILIC VEIN TRANSPOSITION;  Surgeon: Angelia Mould, MD;  Location: [place];  Service: Vascular;  Laterality: Right;  . BELOW KNEE LEG AMPUTATION Right 04/26/2016  . CARDIAC CATHETERIZATION    . CARDIAC DEFIBRILLATOR PLACEMENT  06/27/2013   Sub Q       BY DR Caryl Comes  . CATARACT EXTRACTION W/PHACO Right 08/06/2018   Procedure: CATARACT EXTRACTION PHACO AND INTRAOCULAR LENS PLACEMENT (IOC);  Surgeon: Baruch Goldmann, MD;  Location: [facility];  Service:  Ophthalmology;  Laterality: Right;  CDE: 4.06  . CATARACT EXTRACTION W/PHACO Left 08/20/2018   Procedure: CATARACT EXTRACTION PHACO AND INTRAOCULAR LENS PLACEMENT (IOC);  Surgeon: Baruch Goldmann, MD;  Location: [facility];  Service: Ophthalmology;  Laterality: Left;  CDE: 6.76  . COLONOSCOPY WITH PROPOFOL N/A 07/22/2015   Procedure: COLONOSCOPY WITH PROPOFOL;  Surgeon: Doran Stabler, MD;  Location: [facility];  Service: Gastroenterology;  Laterality: N/A;  . FEMORAL-POPLITEAL BYPASS GRAFT Right 03/31/2016   Procedure: BYPASS GRAFT FEMORAL-POPLITEAL ARTERY USING RIGHT GREATER SAPHENOUS NONREVERSED VEIN;  Surgeon: Angelia Mould, MD;  Location: [place];  Service: Vascular;  Laterality: Right;  . HERNIA REPAIR    . I & D EXTREMITY Right 03/31/2016   Procedure: IRRIGATION AND DEBRIDEMENT FOOT;  Surgeon: Angelia Mould, MD;  Location: [place];  Service: Vascular;  Laterality: Right;  . IMPLANTABLE CARDIOVERTER DEFIBRILLATOR IMPLANT N/A 06/27/2013   Procedure: SUB Q ICD;  Surgeon: Deboraha Sprang, MD;  Location: [facility] CATH LAB;  Service: Cardiovascular;  Laterality: N/A;  . INTRAOPERATIVE ARTERIOGRAM Right 03/31/2016   Procedure: INTRA OPERATIVE ARTERIOGRAM;  Surgeon: Angelia Mould, MD;  Location: [place];  Service: Vascular;  Laterality: Right;  . IR GENERIC HISTORICAL Right 11/30/2015   IR THROMBECTOMY AV FISTULA W/THROMBOLYSIS/PTA INC/SHUNT/IMG RIGHT 11/30/2015 Aletta Edouard, MD [facility]  . IR GENERIC HISTORICAL  11/30/2015   IR US GUIDE VASC ACCESS RIGHT 11/30/2015 Aletta Edouard, MD [facility]  . IR GENERIC HISTORICAL Right 12/15/2015   IR THROMBECTOMY AV FISTULA W/THROMBOLYSIS/PTA/STENT INC/SHUNT/IMG RT 12/15/2015 Arne Cleveland, MD [facility]  . IR GENERIC HISTORICAL  12/15/2015   IR US GUIDE VASC ACCESS RIGHT 12/15/2015 Arne Cleveland, MD [facility]  . IR GENERIC HISTORICAL  12/28/2015   IR FLUORO GUIDE CV LINE RIGHT 12/28/2015 Marybelle Killings, MD [facility]  . IR GENERIC  HISTORICAL  12/28/2015   IR US GUIDE VASC ACCESS RIGHT 12/28/2015 Marybelle Killings, MD [facility]  . LEFT A ND RIGHT HEART CATH  01/30/2013   DR Sung Amabile  . LEFT AND RIGHT HEART CATHETERIZATION WITH CORONARY ANGIOGRAM N/A 01/30/2013   Procedure: LEFT AND RIGHT HEART CATHETERIZATION WITH CORONARY ANGIOGRAM;  Surgeon: Jolaine Artist, MD;  Location: [facility] CATH LAB;  Service: Cardiovascular;  Laterality: N/A;  . PERIPHERAL VASCULAR CATHETERIZATION Right 01/26/2015   Procedure: A/V Fistulagram;  Surgeon: Angelia Mould, MD;  Location: [place] [facility];  Service: Cardiovascular;  Laterality: Right;  . reapea urethral surgery for recurrent obstruction  2011  . TOTAL KNEE ARTHROPLASTY Right 2007  . VEIN HARVEST Right 03/31/2016   Procedure: RIGHT GREATER SAPHENOUS VEIN HARVEST;  Surgeon: Angelia Mould, MD;  Location: [place];  Service: Vascular;  Laterality: Right;    There were no vitals filed for this visit.   Subjective Assessment - 05/13/20 1015    Subjective Relays he had prosthesis revised  and it feels a whole let better. He started with 2 hours on/off wear time and then sunday he was able to wear it most of the day.    Patient is accompained by: Family member   wife, Jermale Crass   Pertinent History Left TTA, automatic cardioverter/defibrillator, arthritis, CAD, cardiomyopathy, CHF, COPD, DM2, neuropathy, ESRD, CAD w/catherization, HTN, kidney stones, right TKA 2007    Patient Stated Goals Walk in home & community.                             [facility] Adult PT Treatment/Exercise - 05/13/20 0001      Transfers   Sit to Stand 4: Min guard;With upper extremity assist   and RW   Stand to Sit 4: Min guard;With upper extremity assist;With armrests;To elevated surface;To chair/3-in-1;Other (comment)      Ambulation/Gait   Ambulation/Gait Yes    Ambulation/Gait Assistance 4: Min guard    Ambulation/Gait Assistance Details excessive UE weight bearing on RW     Ambulation Distance (Feet) 140 Feet   max tolerated distance   Assistive device Rolling walker;Prostheses    Gait Pattern Step-through pattern    Ambulation Surface Level;Indoor      Exercises   Exercises Other Exercises;Knee/Hip      Knee/Hip Exercises: Aerobic   Nustep L5 UE/LE X6 min      Knee/Hip Exercises: Standing   Hip Abduction Both;15 reps;Knee straight   with bilat UE support   Other Standing Knee Exercises with UE support in bars: lateral weight shifting X10 reps, A-P, P-A weight shifts X10 reps then progressed to weightshifting on Lt leg with Rt leg step fwd and retro X10 reps    Other Standing Knee Exercises Balance in bars: wide BOS no UE support 30 seconds progressed to head turns X10 and head nods X10, eyes closed 10 sec X5.      Knee/Hip Exercises: Seated   Sit to Sand 2 sets;5 reps;without UE support   from 24 inch bar stool in bars, needs min A at times                   PT Short Term Goals - 04/29/20 1644      PT SHORT TERM GOAL #1   Title Patient donnes prostheses modified independent & verbalizes proper cleaning.    Time 1    Period Months    Status New    Target Date 05/28/20      PT SHORT TERM GOAL #2   Title Patient tolerates left prosthesis >10 hrs total /day without skin issues    Time 1    Period Months    Status New    Target Date 05/28/20      PT SHORT TERM GOAL #3   Title Patient able to reach 10" with RW support safely    Time 1    Period Months    Status New    Target Date 05/28/20      PT SHORT TERM GOAL #4   Title Patient ambulates 100' with RW & prostheses including sit / stand chair with armrests with supervision    Time 1    Period Months    Status New    Target Date 05/28/20             PT Long Term Goals - 04/29/20 1638      PT LONG TERM GOAL #1   Title Patient demonstrates &  verbalized understanding of prosthetic care to enable safe utilization of prostheses.    Time 12    Period Weeks    Status New     Target Date 07/23/20      PT LONG TERM GOAL #2   Title Patient tolerates prostheses wear for >90% of awake hours without skin issues    Time 12    Period Weeks    Status New    Target Date 07/23/20      PT LONG TERM GOAL #3   Title Berg Balance >/= 36/56 to indicate lower fall risk    Time 12    Period Weeks    Status New    Target Date 07/23/20      PT LONG TERM GOAL #4   Title Patient ambulates 300' with LRAD & prostheses modified independent    Time 12    Period Weeks    Status New    Target Date 07/23/20      PT LONG TERM GOAL #5   Title Patient negotiates ramps, curbs & stairs with LRAD & prostheses modified independent.    Time 12    Period Weeks    Status New    Target Date 07/23/20                 Plan - 05/13/20 1049    Clinical Impression Statement Session focused on gait tolerance and he was able to show improved gait speed and step length after gait training with focus on weight shifting over prosthesis. We then worked on balance and overall strength and conditioning with good overall toleance to this and reports 5/10 fatigue after session. He was reminded to keep an eye on pressure sensative areas as he increases wear time of prosthesis. Continue POC    Personal Factors and Comorbidities Comorbidity 3+;Fitness;Time since onset of injury/illness/exacerbation    Comorbidities Left TTA, automatic cardioverter/defibrillator, arthritis, CAD, cardiomyopathy, CHF, COPD, DM2, neuropathy, ESRD, CAD w/catherization, HTN, kidney stones, right TKA 2007    Examination-Activity Limitations Lift;Locomotion Level;Squat;Stairs;Stand;Transfers    Examination-Participation Restrictions Driving;Community Activity    Stability/Clinical Decision Making Evolving/Moderate complexity    Rehab Potential Good    PT Frequency 2x / week    PT Duration 12 weeks    PT Treatment/Interventions ADLs/Self Care Home Management;DME Instruction;Gait training;Stair training;Functional mobility  training;Therapeutic activities;Therapeutic exercise;Balance training;Neuromuscular re-education;Patient/family education;Prosthetic Training;Vestibular    PT Next Visit Plan set up HEP, review prosthetic care, prosthetic gait with RW, therapeutic exercise to increase functional strength & endurance.    Consulted and Agree with Plan of Care Patient;Family member/caregiver    Family Member Consulted wife           Patient will benefit from skilled therapeutic intervention in order to improve the following deficits and impairments:  Abnormal gait,Decreased activity tolerance,Decreased balance,Decreased endurance,Decreased knowledge of use of DME,Decreased mobility,Decreased skin integrity,Decreased strength,Increased edema,Impaired flexibility,Postural dysfunction,Prosthetic Dependency,Pain  Visit Diagnosis: Other abnormalities of gait and mobility  Unsteadiness on feet  Muscle weakness (generalized)     Problem List Patient Active Problem List   Diagnosis Date Noted  . Malnutrition of moderate degree 11/18/2019  . Below-knee amputation of left lower extremity ([place]) 11/16/2019  . Abscess of left foot 11/13/2019  . Dehiscence of amputation stump ([place])   . History of complete ray amputation of fifth toe of left foot ([place]) 10/09/2019  . Acute pulmonary edema ([facility])   . Osteomyelitis of fifth toe of left foot ([place])   . Febrile illness 08/17/2019  .  SIRS (systemic inflammatory response syndrome) ([place]) 08/17/2019  . Thrombocytopenia ([place]) 08/17/2019  . Pressure injury of back, stage 2 ([place]) 08/17/2019  . Hypertensive emergency 07/10/2019  . Onychomycosis 08/16/2016  . Osteopathy in diseases classified elsewhere, unspecified site 05/14/2016  . Status post unilateral below knee amputation, right ([place]) 04/26/2016  . Bilateral carotid bruits 03/30/2016  . Diabetes mellitus with complication ([place])   . Anemia due to chronic kidney disease   . ESRD on dialysis ([place]) 11/30/2015  . Acute  respiratory failure with hypoxia ([place]) 11/29/2015  . Dependence on renal dialysis ([place]) 08/15/2015  . Presence of automatic (implantable) cardiac defibrillator 03/12/2015  . At high risk for falls 09/29/2014  . Peripheral vascular disease ([place]) 09/29/2014  . Osteoarthritis of both knees 01/07/2014  . Osteoarthritis of multiple joints 10/06/2013  . NICM (nonischemic cardiomyopathy) ([place]) 06/27/2013  . CAD (coronary artery disease) 03/13/2013  . Abnormal nuclear stress test 01/27/2013  . Chronic systolic congestive heart failure ([place]) 01/27/2013  . COPD (chronic obstructive pulmonary disease) ([place]) 12/27/2012  . Diabetic peripheral neuropathy ([place]) 02/10/2012  . Urethral stricture 10/06/2010  . URINARY CALCULUS 09/28/2009  . Adjustment disorder with depressed mood 08/21/2009  . Poorly controlled type II diabetes mellitus with renal complication ([place]) 91/79/1505  . Hyperlipidemia 04/14/2008  . Essential hypertension 04/14/2008  . Allergic rhinitis 04/14/2008    Silvestre Mesi 05/13/2020, 10:52 AM  [facility] Physical Therapy [address] [place], [place], [address] Phone: [phone]   Fax:  [phone]  Name: Anthony Bullock MRN: [id number] Date of Birth: December 29, 1960

## 2020-05-14 DIAGNOSIS — N2581 Secondary hyperparathyroidism of renal origin: Secondary | ICD-10-CM | POA: Diagnosis not present

## 2020-05-14 DIAGNOSIS — D631 Anemia in chronic kidney disease: Secondary | ICD-10-CM | POA: Diagnosis not present

## 2020-05-14 DIAGNOSIS — N186 End stage renal disease: Secondary | ICD-10-CM | POA: Diagnosis not present

## 2020-05-14 DIAGNOSIS — Z992 Dependence on renal dialysis: Secondary | ICD-10-CM | POA: Diagnosis not present

## 2020-05-14 DIAGNOSIS — E209 Hypoparathyroidism, unspecified: Secondary | ICD-10-CM | POA: Diagnosis not present

## 2020-05-14 DIAGNOSIS — D509 Iron deficiency anemia, unspecified: Secondary | ICD-10-CM | POA: Diagnosis not present

## 2020-05-16 DIAGNOSIS — D631 Anemia in chronic kidney disease: Secondary | ICD-10-CM | POA: Diagnosis not present

## 2020-05-16 DIAGNOSIS — Z992 Dependence on renal dialysis: Secondary | ICD-10-CM | POA: Diagnosis not present

## 2020-05-16 DIAGNOSIS — E209 Hypoparathyroidism, unspecified: Secondary | ICD-10-CM | POA: Diagnosis not present

## 2020-05-16 DIAGNOSIS — D509 Iron deficiency anemia, unspecified: Secondary | ICD-10-CM | POA: Diagnosis not present

## 2020-05-16 DIAGNOSIS — N2581 Secondary hyperparathyroidism of renal origin: Secondary | ICD-10-CM | POA: Diagnosis not present

## 2020-05-16 DIAGNOSIS — N186 End stage renal disease: Secondary | ICD-10-CM | POA: Diagnosis not present

## 2020-05-18 ENCOUNTER — Other Ambulatory Visit: Payer: Self-pay

## 2020-05-18 ENCOUNTER — Other Ambulatory Visit: Payer: Self-pay | Admitting: Family Medicine

## 2020-05-18 ENCOUNTER — Ambulatory Visit (INDEPENDENT_AMBULATORY_CARE_PROVIDER_SITE_OTHER): Payer: Medicare Other | Admitting: Physical Therapy

## 2020-05-18 ENCOUNTER — Encounter: Payer: Self-pay | Admitting: Physical Therapy

## 2020-05-18 DIAGNOSIS — R2681 Unsteadiness on feet: Secondary | ICD-10-CM

## 2020-05-18 DIAGNOSIS — M6281 Muscle weakness (generalized): Secondary | ICD-10-CM

## 2020-05-18 DIAGNOSIS — R2689 Other abnormalities of gait and mobility: Secondary | ICD-10-CM

## 2020-05-18 NOTE — Therapy (Signed)
[facility] Therapy [address] [place], [place], [address] Phone: [phone]   Fax:  [phone]  Physical Therapy Treatment  Patient Details  Name: Anthony Bullock MRN: [id number] Date of Birth: 09-02-1960 Referring Provider (PT): Bevely Palmer Persons, [place]   Encounter Date: 05/18/2020   PT End of Session - 05/18/20 1304    Visit Number 3    Number of Visits 25    Date for PT Re-Evaluation 07/23/20    Authorization Type Medicare & BCBS supplement    PT Start Time 1300    PT Stop Time 1345    PT Time Calculation (min) 45 min    Equipment Utilized During Treatment Gait belt    Activity Tolerance Patient tolerated treatment well;Patient limited by fatigue    Behavior During Therapy [facility] for tasks assessed/performed           Past Medical History:  Diagnosis Date  . 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 Allergic rhinitis   . Anemia   . Arthritis   . Chronic systolic heart failure ([place])    a. ECHO (12/2012) EF 25-30%, HK entireanteroseptal myocardium //  b.  EF 25%, diffuse HK, grade 1 diastolic dysfunction, MAC, mild LAE, normal RVSF, trivial pericardial effusion  . COPD (chronic obstructive pulmonary disease) ([place])   . Diabetes mellitus type II   . Diabetic nephropathy ([place])   . Diabetic neuropathy ([place])   . ESRD on hemodialysis [facility])    started HD June 2017, goes to [facility], Dr Hinda Lenis  . History of cardiac catheterization    a.Myoview 1/15:  There is significant left ventricular dysfunction. There may be slight scar at the apex. There is no significant ischemia. LV Ejection Fraction: 27%  //  b. RHC/LHC (1/15) with mean RA 6, PA 47/22 mean 33, mean PCWP 20, PVR 2.5 WU, CI 2.5; 80% dLAD stenosis, 70% diffuse large D.   . History of kidney stones   . Hyperlipidemia   . Hypertension   . Kidney stones   . NICM (nonischemic cardiomyopathy) ([place])    Primarily nonischemic. Echo (12/14) with EF  25-30%. Echo (3/15) with EF 25%, mild to moderately dilated LV, normal RV size and systolic function.   . Osteomyelitis ([place])    left fifth ray  . Pneumonia   . Urethral stricture   . Wears glasses     Past Surgical History:  Procedure Laterality Date  . ABDOMINAL AORTOGRAM W/LOWER EXTREMITY N/A 03/30/2016   Procedure: Abdominal Aortogram w/Lower Extremity;  Surgeon: Angelia Mould, MD;  Location: [place] [facility];  Service: Cardiovascular;  Laterality: N/A;  . AMPUTATION Right 04/26/2016   Procedure: Right Below Knee Amputation;  Surgeon: Newt Minion, MD;  Location: [place];  Service: Orthopedics;  Laterality: Right;  . AMPUTATION Left 08/21/2019   Procedure: LEFT FOOT 5TH RAY AMPUTATION;  Surgeon: Newt Minion, MD;  Location: [place];  Service: Orthopedics;  Laterality: Left;  . AMPUTATION Left 11/13/2019   Procedure: LEFT BELOW KNEE AMPUTATION;  Surgeon: Newt Minion, MD;  Location: [place];  Service: Orthopedics;  Laterality: Left;  . AV FISTULA PLACEMENT Right 09/08/2015   Procedure: INSERTION OF 4-64m x 45cm  ARTERIOVENOUS (AV) GORE-TEX GRAFT RIGHT UPPER  ARM;  Surgeon: CAngelia Mould MD;  Location: [place]  Service: Vascular;  Laterality: Right;  . AV FISTULA PLACEMENT Left 01/14/2016   Procedure: CREATION OF LEFT UPPER ARM ARTERIOVENOUS FISTULA;  Surgeon: CJudeth Cornfield  Scot Dock, MD;  Location: [place];  Service: Vascular;  Laterality: Left;  . BASCILIC VEIN TRANSPOSITION Right 08/22/2014   Procedure: RIGHT UPPER ARM BASCILIC VEIN TRANSPOSITION;  Surgeon: Angelia Mould, MD;  Location: [place];  Service: Vascular;  Laterality: Right;  . BELOW KNEE LEG AMPUTATION Right 04/26/2016  . CARDIAC CATHETERIZATION    . CARDIAC DEFIBRILLATOR PLACEMENT  06/27/2013   Sub Q       BY DR Caryl Comes  . CATARACT EXTRACTION W/PHACO Right 08/06/2018   Procedure: CATARACT EXTRACTION PHACO AND INTRAOCULAR LENS PLACEMENT (IOC);  Surgeon: Baruch Goldmann, MD;  Location: [facility];  Service:  Ophthalmology;  Laterality: Right;  CDE: 4.06  . CATARACT EXTRACTION W/PHACO Left 08/20/2018   Procedure: CATARACT EXTRACTION PHACO AND INTRAOCULAR LENS PLACEMENT (IOC);  Surgeon: Baruch Goldmann, MD;  Location: [facility];  Service: Ophthalmology;  Laterality: Left;  CDE: 6.76  . COLONOSCOPY WITH PROPOFOL N/A 07/22/2015   Procedure: COLONOSCOPY WITH PROPOFOL;  Surgeon: Doran Stabler, MD;  Location: [facility];  Service: Gastroenterology;  Laterality: N/A;  . FEMORAL-POPLITEAL BYPASS GRAFT Right 03/31/2016   Procedure: BYPASS GRAFT FEMORAL-POPLITEAL ARTERY USING RIGHT GREATER SAPHENOUS NONREVERSED VEIN;  Surgeon: Angelia Mould, MD;  Location: [place];  Service: Vascular;  Laterality: Right;  . HERNIA REPAIR    . I & D EXTREMITY Right 03/31/2016   Procedure: IRRIGATION AND DEBRIDEMENT FOOT;  Surgeon: Angelia Mould, MD;  Location: [place];  Service: Vascular;  Laterality: Right;  . IMPLANTABLE CARDIOVERTER DEFIBRILLATOR IMPLANT N/A 06/27/2013   Procedure: SUB Q ICD;  Surgeon: Deboraha Sprang, MD;  Location: [facility];  Service: Cardiovascular;  Laterality: N/A;  . INTRAOPERATIVE ARTERIOGRAM Right 03/31/2016   Procedure: INTRA OPERATIVE ARTERIOGRAM;  Surgeon: Angelia Mould, MD;  Location: [place];  Service: Vascular;  Laterality: Right;  . IR GENERIC HISTORICAL Right 11/30/2015   IR THROMBECTOMY AV FISTULA W/THROMBOLYSIS/PTA INC/SHUNT/IMG RIGHT 11/30/2015 Aletta Edouard, MD [facility]  . IR GENERIC HISTORICAL  11/30/2015   IR US GUIDE VASC ACCESS RIGHT 11/30/2015 Aletta Edouard, MD [facility]  . IR GENERIC HISTORICAL Right 12/15/2015   IR THROMBECTOMY AV FISTULA W/THROMBOLYSIS/PTA/STENT INC/SHUNT/IMG RT 12/15/2015 Arne Cleveland, MD [facility]  . IR GENERIC HISTORICAL  12/15/2015   IR US GUIDE VASC ACCESS RIGHT 12/15/2015 Arne Cleveland, MD [facility]  . IR GENERIC HISTORICAL  12/28/2015   IR FLUORO GUIDE CV LINE RIGHT 12/28/2015 Marybelle Killings, MD [facility]  . IR GENERIC  HISTORICAL  12/28/2015   IR US GUIDE VASC ACCESS RIGHT 12/28/2015 Marybelle Killings, MD [facility]  . LEFT A ND RIGHT HEART CATH  01/30/2013   DR Sung Amabile  . LEFT AND RIGHT HEART CATHETERIZATION WITH CORONARY ANGIOGRAM N/A 01/30/2013   Procedure: LEFT AND RIGHT HEART CATHETERIZATION WITH CORONARY ANGIOGRAM;  Surgeon: Jolaine Artist, MD;  Location: [facility] C[facility] LAB;  Service: Cardiovascular;  Laterality: N/A;  . PERIPHERAL VASCULAR CATHETERIZATION Right 01/26/2015   Procedure: A/V Fistulagram;  Surgeon: Angelia Mould, MD;  Location: [place] [facility];  Service: Cardiovascular;  Laterality: Right;  . reapea urethral surgery for recurrent obstruction  2011  . TOTAL KNEE ARTHROPLASTY Right 2007  . VEIN HARVEST Right 03/31/2016   Procedure: RIGHT GREATER SAPHENOUS VEIN HARVEST;  Surgeon: Angelia Mould, MD;  Location: [place];  Service: Vascular;  Laterality: Right;    There were no vitals filed for this visit.   Subjective Assessment - 05/18/20 1300    Subjective He is wearing both prostheses  most of awake hours except left prosthesis not to dialysis yet.    Patient is accompained by: Family member   wife, Jondavid Schreier   Pertinent History Left TTA, automatic cardioverter/defibrillator, arthritis, CAD, cardiomyopathy, CHF, COPD, DM2, neuropathy, ESRD, CAD w/catherization, HTN, kidney stones, right TKA 2007    Patient Stated Goals Walk in home & community.    Currently in Pain? No/denies                             [facility] Adult PT Treatment/Exercise - 05/18/20 1300      Transfers   Sit to Stand 5: Supervision;With upper extremity assist;With armrests;From chair/3-in-1;Other (comment)   to RW   Stand to Sit 5: Supervision;With upper extremity assist;With armrests;To chair/3-in-1;Other (comment)   from RW     Ambulation/Gait   Ambulation/Gait Yes    Ambulation/Gait Assistance 5: Supervision    Ambulation/Gait Assistance Details demo, tactile & verbal cues on upright  posture & step width    Ambulation Distance (Feet) 140 Feet    Assistive device Rolling walker;Prostheses    Gait Pattern Step-through pattern    Ambulation Surface Level;Indoor    Ramp 4: Min assist   RW & prostheses   Ramp Details (indicate cue type and reason) PT demo, verbal & tactile cues on technique with prostheses    Curb 4: Min assist   RW & prostheses   Curb Details (indicate cue type and reason) PT demo, verbal & tactile cues on technique with prostheses. 1st time leading LLE & 2nd time leading RLE      Self-Care   Self-Care ADL's    ADL's sitting on 24" bar stool for ADLs to improve trunk control & endurance without UE support. pt & wife verbalized understanding. Using bar stool with prostheses for sponge bath at sink as he can not shower with dialysis catheter. pt & wife verbalized understanding.      Exercises   Exercises Other Exercises;Knee/Hip      Knee/Hip Exercises: Aerobic   Nustep L7 for 4 min & L5 for 2 min  UE/LE seat 11      Knee/Hip Exercises: Standing   Hip Abduction --    Other Standing Knee Exercises --    Other Standing Knee Exercises --      Knee/Hip Exercises: Seated   Sit to Sand 1 set;10 reps;without UE support   from 24 inch bar stool in bars     Prosthetics   Prosthetic Care Comments  PT instructed in not using lotion on limb when wearing prostheses.    Current prosthetic wear tolerance (days/week)  daily    Current prosthetic wear tolerance (#hours/day)  most of awake hours    Education Provided Residual limb care    Person(s) Educated Patient;Spouse    Education Method Explanation;Verbal cues    Education Method Verbalized understanding;Verbal cues required;Needs further instruction                    PT Short Term Goals - 04/29/20 1644      PT SHORT TERM GOAL #1   Title Patient donnes prostheses modified independent & verbalizes proper cleaning.    Time 1    Period Months    Status New    Target Date 05/28/20      PT  SHORT TERM GOAL #2   Title Patient tolerates left prosthesis >10 hrs total /day without skin issues    Time 1  Period Months    Status New    Target Date 05/28/20      PT SHORT TERM GOAL #3   Title Patient able to reach 10" with RW support safely    Time 1    Period Months    Status New    Target Date 05/28/20      PT SHORT TERM GOAL #4   Title Patient ambulates 100' with RW & prostheses including sit / stand chair with armrests with supervision    Time 1    Period Months    Status New    Target Date 05/28/20             PT Long Term Goals - 04/29/20 1638      PT LONG TERM GOAL #1   Title Patient demonstrates & verbalized understanding of prosthetic care to enable safe utilization of prostheses.    Time 12    Period Weeks    Status New    Target Date 07/23/20      PT LONG TERM GOAL #2   Title Patient tolerates prostheses wear for >90% of awake hours without skin issues    Time 12    Period Weeks    Status New    Target Date 07/23/20      PT LONG TERM GOAL #3   Title Berg Balance >/= 36/56 to indicate lower fall risk    Time 12    Period Weeks    Status New    Target Date 07/23/20      PT LONG TERM GOAL #4   Title Patient ambulates 300' with LRAD & prostheses modified independent    Time 12    Period Weeks    Status New    Target Date 07/23/20      PT LONG TERM GOAL #5   Title Patient negotiates ramps, curbs & stairs with LRAD & prostheses modified independent.    Time 12    Period Weeks    Status New    Target Date 07/23/20                 Plan - 05/18/20 1305    Clinical Impression Statement PT instructed in negotiating ramps & curbs with bilateral TTA prostheses which he appears to understand better.  PT also instructed in use of bar stool for ADLs and benefits which his wife & he appear to understand.    Personal Factors and Comorbidities Comorbidity 3+;Fitness;Time since onset of injury/illness/exacerbation    Comorbidities Left TTA,  automatic cardioverter/defibrillator, arthritis, CAD, cardiomyopathy, CHF, COPD, DM2, neuropathy, ESRD, CAD w/catherization, HTN, kidney stones, right TKA 2007    Examination-Activity Limitations Lift;Locomotion Level;Squat;Stairs;Stand;Transfers    Examination-Participation Restrictions Driving;Community Activity    Stability/Clinical Decision Making Evolving/Moderate complexity    Rehab Potential Good    PT Frequency 2x / week    PT Duration 12 weeks    PT Treatment/Interventions ADLs/Self Care Home Management;DME Instruction;Gait training;Stair training;Functional mobility training;Therapeutic activities;Therapeutic exercise;Balance training;Neuromuscular re-education;Patient/family education;Prosthetic Training;Vestibular    PT Next Visit Plan set up HEP, review prosthetic care, prosthetic gait with RW, therapeutic exercise to increase functional strength & endurance.    Consulted and Agree with Plan of Care Patient;Family member/caregiver    Family Member Consulted wife           Patient will benefit from skilled therapeutic intervention in order to improve the following deficits and impairments:  Abnormal gait,Decreased activity tolerance,Decreased balance,Decreased endurance,Decreased knowledge of use of DME,Decreased mobility,Decreased skin  integrity,Decreased strength,Increased edema,Impaired flexibility,Postural dysfunction,Prosthetic Dependency,Pain  Visit Diagnosis: Other abnormalities of gait and mobility  Unsteadiness on feet  Muscle weakness (generalized)     Problem List Patient Active Problem List   Diagnosis Date Noted  . Malnutrition of moderate degree 11/18/2019  . Below-knee amputation of left lower extremity ([place]) 11/16/2019  . Abscess of left foot 11/13/2019  . Dehiscence of amputation stump ([place])   . History of complete ray amputation of fifth toe of left foot ([place]) 10/09/2019  . Acute pulmonary edema ([facility])   . Osteomyelitis of fifth toe of left foot ([place])    . Febrile illness 08/17/2019  . SIRS (systemic inflammatory response syndrome) ([place]) 08/17/2019  . Thrombocytopenia ([place]) 08/17/2019  . Pressure injury of back, stage 2 ([place]) 08/17/2019  . Hypertensive emergency 07/10/2019  . Onychomycosis 08/16/2016  . Osteopathy in diseases classified elsewhere, unspecified site 05/14/2016  . Status post unilateral below knee amputation, right ([place]) 04/26/2016  . Bilateral carotid bruits 03/30/2016  . Diabetes mellitus with complication ([place])   . Anemia due to chronic kidney disease   . ESRD on dialysis ([place]) 11/30/2015  . Acute respiratory failure with hypoxia ([place]) 11/29/2015  . Dependence on renal dialysis ([place]) 08/15/2015  . Presence of automatic (implantable) cardiac defibrillator 03/12/2015  . At high risk for falls 09/29/2014  . Peripheral vascular disease ([place]) 09/29/2014  . Osteoarthritis of both knees 01/07/2014  . Osteoarthritis of multiple joints 10/06/2013  . NICM (nonischemic cardiomyopathy) ([place]) 06/27/2013  . CAD (coronary artery disease) 03/13/2013  . Abnormal nuclear stress test 01/27/2013  . Chronic systolic congestive heart failure ([place]) 01/27/2013  . COPD (chronic obstructive pulmonary disease) ([place]) 12/27/2012  . Diabetic peripheral neuropathy ([place]) 02/10/2012  . Urethral stricture 10/06/2010  . URINARY CALCULUS 09/28/2009  . Adjustment disorder with depressed mood 08/21/2009  . Poorly controlled type II diabetes mellitus with renal complication ([place]) 77/93/9030  . Hyperlipidemia 04/14/2008  . Essential hypertension 04/14/2008  . Allergic rhinitis 04/14/2008    Jamey Reas, PT, DPT 05/18/2020, 4:16 PM  [facility] [address] [place], [place], [address] Phone: [phone]   Fax:  [phone]  Name: Anthony Bullock MRN: [id number] Date of Birth: [birth year]

## 2020-05-19 ENCOUNTER — Other Ambulatory Visit: Payer: Self-pay | Admitting: Family Medicine

## 2020-05-19 DIAGNOSIS — D509 Iron deficiency anemia, unspecified: Secondary | ICD-10-CM | POA: Diagnosis not present

## 2020-05-19 DIAGNOSIS — E209 Hypoparathyroidism, unspecified: Secondary | ICD-10-CM | POA: Diagnosis not present

## 2020-05-19 DIAGNOSIS — N186 End stage renal disease: Secondary | ICD-10-CM | POA: Diagnosis not present

## 2020-05-19 DIAGNOSIS — D631 Anemia in chronic kidney disease: Secondary | ICD-10-CM | POA: Diagnosis not present

## 2020-05-19 DIAGNOSIS — N2581 Secondary hyperparathyroidism of renal origin: Secondary | ICD-10-CM | POA: Diagnosis not present

## 2020-05-19 DIAGNOSIS — Z992 Dependence on renal dialysis: Secondary | ICD-10-CM | POA: Diagnosis not present

## 2020-05-19 NOTE — Telephone Encounter (Signed)
Last filled in 2020. Ok to fill?

## 2020-05-19 NOTE — Telephone Encounter (Signed)
Okay to refill but he needs diabetic follow-up soon

## 2020-05-20 ENCOUNTER — Other Ambulatory Visit: Payer: Self-pay

## 2020-05-20 ENCOUNTER — Ambulatory Visit (INDEPENDENT_AMBULATORY_CARE_PROVIDER_SITE_OTHER): Payer: Medicare Other | Admitting: Physical Therapy

## 2020-05-20 ENCOUNTER — Encounter: Payer: Self-pay | Admitting: Physical Therapy

## 2020-05-20 ENCOUNTER — Telehealth: Payer: Self-pay | Admitting: Family Medicine

## 2020-05-20 DIAGNOSIS — M6281 Muscle weakness (generalized): Secondary | ICD-10-CM | POA: Diagnosis not present

## 2020-05-20 DIAGNOSIS — R2689 Other abnormalities of gait and mobility: Secondary | ICD-10-CM

## 2020-05-20 DIAGNOSIS — R2681 Unsteadiness on feet: Secondary | ICD-10-CM

## 2020-05-20 NOTE — Telephone Encounter (Signed)
Rx has already been sent in today by Dr. Elease Hashimoto. Nothing further needed.

## 2020-05-20 NOTE — Patient Instructions (Signed)
Increasing your activity level is important. °Short distances which is walking from one room to another. Work to increase frequency back to prior level. °Medium distances are entering & exiting your home or community with limited distances. Start with 4 medium walks which is one outing to one location and increase number of tolerated amounts. °Long distance is your highest tolerance for you. Walk until you feel you must rest. Back or leg pain or general fatigue are indicators to maximum tolerance. Monitor by distance or time. Try to walk your BEST distance 1-2 times per day. You should see this increase over time.  °

## 2020-05-20 NOTE — Therapy (Signed)
[facility] Physical Therapy [address] [place], [place], [address] Phone: [phone]   Fax:  [phone]  Physical Therapy Treatment  Patient Details  Name: Anthony Bullock MRN: [id number] Date of Birth: [birth year] Referring Provider (PT): Bevely Palmer Persons, [place]   Encounter Date: 05/20/2020   PT End of Session - 05/20/20 1434    Visit Number 4    Number of Visits 25    Date for PT Re-Evaluation 07/23/20    Authorization Type Medicare & BCBS supplement    PT Start Time 1430    PT Stop Time 1516    PT Time Calculation (min) 46 min    Equipment Utilized During Treatment Gait belt    Activity Tolerance Patient tolerated treatment well;Patient limited by fatigue    Behavior During Therapy [facility] for tasks assessed/performed           Past Medical History:  Diagnosis Date  . 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 Allergic rhinitis   . Anemia   . Arthritis   . Chronic systolic heart failure ([place])    a. ECHO (12/2012) EF 25-30%, HK entireanteroseptal myocardium //  b.  EF 25%, diffuse HK, grade 1 diastolic dysfunction, MAC, mild LAE, normal RVSF, trivial pericardial effusion  . COPD (chronic obstructive pulmonary disease) ([place])   . Diabetes mellitus type II   . Diabetic nephropathy ([place])   . Diabetic neuropathy ([place])   . ESRD on hemodialysis [facility])    started HD June 2017, goes to [facility] HD unit, Dr Hinda Lenis  . History of cardiac catheterization    a.Myoview 1/15:  There is significant left ventricular dysfunction. There may be slight scar at the apex. There is no significant ischemia. LV Ejection Fraction: 27%  //  b. RHC/LHC (1/15) with mean RA 6, PA 47/22 mean 33, mean PCWP 20, PVR 2.5 WU, CI 2.5; 80% dLAD stenosis, 70% diffuse large D.   . History of kidney stones   . Hyperlipidemia   . Hypertension   . Kidney stones   . NICM (nonischemic cardiomyopathy) ([place])    Primarily nonischemic. Echo (12/14) with EF  25-30%. Echo (3/15) with EF 25%, mild to moderately dilated LV, normal RV size and systolic function.   . Osteomyelitis ([place])    left fifth ray  . Pneumonia   . Urethral stricture   . Wears glasses     Past Surgical History:  Procedure Laterality Date  . ABDOMINAL AORTOGRAM W/LOWER EXTREMITY N/A 03/30/2016   Procedure: Abdominal Aortogram w/Lower Extremity;  Surgeon: Angelia Mould, MD;  Location: [place] [facility];  Service: Cardiovascular;  Laterality: N/A;  . AMPUTATION Right 04/26/2016   Procedure: Right Below Knee Amputation;  Surgeon: Newt Minion, MD;  Location: [place];  Service: Orthopedics;  Laterality: Right;  . AMPUTATION Left 08/21/2019   Procedure: LEFT FOOT 5TH RAY AMPUTATION;  Surgeon: Newt Minion, MD;  Location: [place];  Service: Orthopedics;  Laterality: Left;  . AMPUTATION Left 11/13/2019   Procedure: LEFT BELOW KNEE AMPUTATION;  Surgeon: Newt Minion, MD;  Location: [place];  Service: Orthopedics;  Laterality: Left;  . AV FISTULA PLACEMENT Right 09/08/2015   Procedure: INSERTION OF 4-46m x 45cm  ARTERIOVENOUS (AV) GORE-TEX GRAFT RIGHT UPPER  ARM;  Surgeon: CAngelia Mould MD;  [place]  Service: Vascular;  Laterality: Right;  . AV FISTULA PLACEMENT Left 01/14/2016   Procedure: CREATION OF LEFT UPPER ARM ARTERIOVENOUS FISTULA;  Surgeon: CJudeth Cornfield  Scot Dock, MD;  Location: [place];  Service: Vascular;  Laterality: Left;  . BASCILIC VEIN TRANSPOSITION Right 08/22/2014   Procedure: RIGHT UPPER ARM BASCILIC VEIN TRANSPOSITION;  Surgeon: Angelia Mould, MD;  Location: [place];  Service: Vascular;  Laterality: Right;  . BELOW KNEE LEG AMPUTATION Right 04/26/2016  . CARDIAC CATHETERIZATION    . CARDIAC DEFIBRILLATOR PLACEMENT  06/27/2013   Sub Q       BY DR Caryl Comes  . CATARACT EXTRACTION W/PHACO Right 08/06/2018   Procedure: CATARACT EXTRACTION PHACO AND INTRAOCULAR LENS PLACEMENT (IOC);  Surgeon: Baruch Goldmann, MD;  [facility]: [facility];  Service:  Ophthalmology;  Laterality: Right;  CDE: 4.06  . CATARACT EXTRACTION W/PHACO Left 08/20/2018   Procedure: CATARACT EXTRACTION PHACO AND INTRAOCULAR LENS PLACEMENT (IOC);  Surgeon: Baruch Goldmann, MD;  Location: [facility];  Service: Ophthalmology;  Laterality: Left;  CDE: 6.76  . COLONOSCOPY WITH PROPOFOL N/A 07/22/2015   Procedure: COLONOSCOPY WITH PROPOFOL;  Surgeon: Doran Stabler, MD;  Location: [facility];  Service: Gastroenterology;  Laterality: N/A;  . FEMORAL-POPLITEAL BYPASS GRAFT Right 03/31/2016   Procedure: BYPASS GRAFT FEMORAL-POPLITEAL ARTERY USING RIGHT GREATER SAPHENOUS NONREVERSED VEIN;  Surgeon: Angelia Mould, MD;  Location: [place];  Service: Vascular;  Laterality: Right;  . HERNIA REPAIR    . I & D EXTREMITY Right 03/31/2016   Procedure: IRRIGATION AND DEBRIDEMENT FOOT;  Surgeon: Angelia Mould, MD;  Location: [place];  Service: Vascular;  Laterality: Right;  . IMPLANTABLE CARDIOVERTER DEFIBRILLATOR IMPLANT N/A 06/27/2013   Procedure: SUB Q ICD;  Surgeon: Deboraha Sprang, MD;  Location: [facility] C[facility] LAB;  Service: Cardiovascular;  Laterality: N/A;  . INTRAOPERATIVE ARTERIOGRAM Right 03/31/2016   Procedure: INTRA OPERATIVE ARTERIOGRAM;  Surgeon: Angelia Mould, MD;  Location: [place];  Service: Vascular;  Laterality: Right;  . IR GENERIC HISTORICAL Right 11/30/2015   IR THROMBECTOMY AV FISTULA W/THROMBOLYSIS/PTA INC/SHUNT/IMG RIGHT 11/30/2015 Aletta Edouard, MD [facility]  . IR GENERIC HISTORICAL  11/30/2015   IR US GUIDE VASC ACCESS RIGHT 11/30/2015 Aletta Edouard, MD [facility]  . IR GENERIC HISTORICAL Right 12/15/2015   IR THROMBECTOMY AV FISTULA W/THROMBOLYSIS/PTA/STENT INC/SHUNT/IMG RT 12/15/2015 Arne Cleveland, MD [facility]  . IR GENERIC HISTORICAL  12/15/2015   IR US GUIDE VASC ACCESS RIGHT 12/15/2015 Arne Cleveland, MD [facility]  . IR GENERIC HISTORICAL  12/28/2015   IR FLUORO GUIDE CV LINE RIGHT 12/28/2015 Marybelle Killings, MD [facility]  . IR GENERIC  HISTORICAL  12/28/2015   IR US GUIDE VASC ACCESS RIGHT 12/28/2015 Marybelle Killings, MD [facility]  . LEFT A ND RIGHT HEART CATH  01/30/2013   DR Sung Amabile  . LEFT AND RIGHT HEART CATHETERIZATION WITH CORONARY ANGIOGRAM N/A 01/30/2013   Procedure: LEFT AND RIGHT HEART CATHETERIZATION WITH CORONARY ANGIOGRAM;  Surgeon: Jolaine Artist, MD;  Location: [facility] CATH LAB;  Service: Cardiovascular;  Laterality: N/A;  . PERIPHERAL VASCULAR CATHETERIZATION Right 01/26/2015   Procedure: A/V Fistulagram;  Surgeon: Angelia Mould, MD;  Location: [place] [facility];  Service: Cardiovascular;  Laterality: Right;  . reapea urethral surgery for recurrent obstruction  2011  . TOTAL KNEE ARTHROPLASTY Right 2007  . VEIN HARVEST Right 03/31/2016   Procedure: RIGHT GREATER SAPHENOUS VEIN HARVEST;  Surgeon: Angelia Mould, MD;  Location: [place];  Service: Vascular;  Laterality: Right;    There were no vitals filed for this visit.   Subjective Assessment - 05/20/20 1430    Subjective He is not using lotion  on limbs now.  He bought bar stool and used it.    Patient is accompained by: Family member   wife, [employer]e Hands   Pertinent History Left TTA, automatic cardioverter/defibrillator, arthritis, CAD, cardiomyopathy, CHF, COPD, DM2, neuropathy, ESRD, CAD w/catherization, HTN, kidney stones, right TKA 2007    Patient Stated Goals Walk in home & community.    Currently in Pain? No/denies                             [facility] Adult PT Treatment/Exercise - 05/20/20 1430      Transfers   Sit to Stand 5: Supervision;With upper extremity assist;With armrests;From chair/3-in-1;Other (comment)   to RW   Sit to Stand Details Visual cues for safe use of DME/AE;Verbal cues for sequencing;Verbal cues for technique    Sit to Stand Details (indicate cue type and reason) worked on not touching RW for stabilization & technique for chairs without armrests using UEs    Stand to Sit 5: Supervision;With upper  extremity assist;With armrests;To chair/3-in-1;Other (comment)   from RW   Stand to Sit Details (indicate cue type and reason) Visual cues for safe use of DME/AE;Verbal cues for sequencing;Verbal cues for technique    Stand to Sit Details worked on not touching RW for stabilization & technique for chairs without armrests using UEs      Ambulation/Gait   Ambulation/Gait Yes    Ambulation/Gait Assistance 5: Supervision    Ambulation Distance (Feet) 140 Feet    Assistive device Rolling walker;Prostheses    Gait Pattern Step-through pattern    Ramp 4: Min assist   min guard RW & prostheses   Ramp Details (indicate cue type and reason) verbal & tactile cues on technique with prostheses    Curb 5: Supervision   RW & prostheses   Curb Details (indicate cue type and reason) verbal cues on technique with prostheses. 1st time leading LLE & 2nd time leading RLE    Gait Comments Walking program to improve activity tolerance, stamina & endurance.  See pt instructions.  PT noted that frequency & distance will be significantly less on dialysis days compared to nondialysis days. Pt & wife verbalized understanding.      Self-Care   Self-Care --    ADL's --      Neuro Re-ed    Neuro Re-ed Details  standing with back to door frame single UE & BUEs overhead reach hold 2 deep breaths 2 reps ea.   Standing back to counter for 3 min with upright posture.  Hand written as HEP. pt verbalized & return demo understanding.      Exercises   Exercises Other Exercises;Knee/Hip      Knee/Hip Exercises: Stretches   Active Hamstring Stretch Right;Left;2 reps;20 seconds    Active Hamstring Stretch Limitations supine with strap DF      Knee/Hip Exercises: Aerobic   Nustep --      Knee/Hip Exercises: Machines for Strengthening   Total Gym Leg Press shuttle leg press 100# 15 reps 2 sets      Knee/Hip Exercises: Seated   Sit to Sand 1 set;10 reps;without UE support   from 24 inch bar stool in bars     Prosthetics    Prosthetic Care Comments  PT recommended use of Secret antipespirant on limbs after bathing to help with sweat management.    Current prosthetic wear tolerance (days/week)  daily    Current prosthetic wear tolerance (#hours/day)  most of awake hours    Education Provided Residual limb care;Other (comment)   see prosthetic care comments   Person(s) Educated Patient;Spouse    Education Method Explanation;Verbal cues    Education Method Verbalized understanding;Verbal cues required;Needs further instruction                  PT Education - 05/20/20 1533    Education Details HEP - walking program  see pt instructions    Person(s) Educated Patient;Spouse    Methods Explanation;Demonstration;Tactile cues;Verbal cues;Handout    Comprehension Verbalized understanding;Returned demonstration;Verbal cues required;Tactile cues required;Need further instruction            PT Short Term Goals - 04/29/20 1644      PT SHORT TERM GOAL #1   Title Patient donnes prostheses modified independent & verbalizes proper cleaning.    Time 1    Period Months    Status New    Target Date 05/28/20      PT SHORT TERM GOAL #2   Title Patient tolerates left prosthesis >10 hrs total /day without skin issues    Time 1    Period Months    Status New    Target Date 05/28/20      PT SHORT TERM GOAL #3   Title Patient able to reach 10" with RW support safely    Time 1    Period Months    Status New    Target Date 05/28/20      PT SHORT TERM GOAL #4   Title Patient ambulates 100' with RW & prostheses including sit / stand chair with armrests with supervision    Time 1    Period Months    Status New    Target Date 05/28/20             PT Long Term Goals - 04/29/20 1638      PT LONG TERM GOAL #1   Title Patient demonstrates & verbalized understanding of prosthetic care to enable safe utilization of prostheses.    Time 12    Period Weeks    Status New    Target Date 07/23/20      PT LONG  TERM GOAL #2   Title Patient tolerates prostheses wear for >90% of awake hours without skin issues    Time 12    Period Weeks    Status New    Target Date 07/23/20      PT LONG TERM GOAL #3   Title Berg Balance >/= 36/56 to indicate lower fall risk    Time 12    Period Weeks    Status New    Target Date 07/23/20      PT LONG TERM GOAL #4   Title Patient ambulates 300' with LRAD & prostheses modified independent    Time 12    Period Weeks    Status New    Target Date 07/23/20      PT LONG TERM GOAL #5   Title Patient negotiates ramps, curbs & stairs with LRAD & prostheses modified independent.    Time 12    Period Weeks    Status New    Target Date 07/23/20                 Plan - 05/20/20 1434    Clinical Impression Statement PT instructed in additional HEP of walking & standing programs which he appears to understand.  PT progressed sit to/from stand to chairs without armrests and  goal to not touch RW.  Pt was fatigued at end of session but reports "good workout."    Personal Factors and Comorbidities Comorbidity 3+;Fitness;Time since onset of injury/illness/exacerbation    Comorbidities Left TTA, automatic cardioverter/defibrillator, arthritis, CAD, cardiomyopathy, CHF, COPD, DM2, neuropathy, ESRD, CAD w/catherization, HTN, kidney stones, right TKA 2007    Examination-Activity Limitations Lift;Locomotion Level;Squat;Stairs;Stand;Transfers    Examination-Participation Restrictions Driving;Community Activity    Stability/Clinical Decision Making Evolving/Moderate complexity    Rehab Potential Good    PT Frequency 2x / week    PT Duration 12 weeks    PT Treatment/Interventions ADLs/Self Care Home Management;DME Instruction;Gait training;Stair training;Functional mobility training;Therapeutic activities;Therapeutic exercise;Balance training;Neuromuscular re-education;Patient/family education;Prosthetic Training;Vestibular    PT Next Visit Plan check STGs,  review  prosthetic care, prosthetic gait with RW, therapeutic exercise to increase functional strength & endurance.    Consulted and Agree with Plan of Care Patient;Family member/caregiver    Family Member Consulted wife           Patient will benefit from skilled therapeutic intervention in order to improve the following deficits and impairments:  Abnormal gait,Decreased activity tolerance,Decreased balance,Decreased endurance,Decreased knowledge of use of DME,Decreased mobility,Decreased skin integrity,Decreased strength,Increased edema,Impaired flexibility,Postural dysfunction,Prosthetic Dependency,Pain  Visit Diagnosis: Other abnormalities of gait and mobility  Unsteadiness on feet  Muscle weakness (generalized)     Problem List Patient Active Problem List   Diagnosis Date Noted  . Malnutrition of moderate degree 11/18/2019  . Below-knee amputation of left lower extremity ([place]) 11/16/2019  . Abscess of left foot 11/13/2019  . Dehiscence of amputation stump ([place])   . History of complete ray amputation of fifth toe of left foot ([place]) 10/09/2019  . Acute pulmonary edema ([facility])   . Osteomyelitis of fifth toe of left foot ([place])   . Febrile illness 08/17/2019  . SIRS (systemic inflammatory response syndrome) ([place]) 08/17/2019  . Thrombocytopenia ([place]) 08/17/2019  . Pressure injury of back, stage 2 ([place]) 08/17/2019  . Hypertensive emergency 07/10/2019  . Onychomycosis 08/16/2016  . Osteopathy in diseases classified elsewhere, unspecified site 05/14/2016  . Status post unilateral below knee amputation, right ([place]) 04/26/2016  . Bilateral carotid bruits 03/30/2016  . Diabetes mellitus with complication ([place])   . Anemia due to chronic kidney disease   . ESRD on dialysis ([place]) 11/30/2015  . Acute respiratory failure with hypoxia ([place]) 11/29/2015  . Dependence on renal dialysis ([place]) 08/15/2015  . Presence of automatic (implantable) cardiac defibrillator 03/12/2015  . At high risk for  falls 09/29/2014  . Peripheral vascular disease ([place]) 09/29/2014  . Osteoarthritis of both knees 01/07/2014  . Osteoarthritis of multiple joints 10/06/2013  . NICM (nonischemic cardiomyopathy) ([place]) 06/27/2013  . CAD (coronary artery disease) 03/13/2013  . Abnormal nuclear stress test 01/27/2013  . Chronic systolic congestive heart failure ([place]) 01/27/2013  . COPD (chronic obstructive pulmonary disease) ([place]) 12/27/2012  . Diabetic peripheral neuropathy ([place]) 02/10/2012  . Urethral stricture 10/06/2010  . URINARY CALCULUS 09/28/2009  . Adjustment disorder with depressed mood 08/21/2009  . Poorly controlled type II diabetes mellitus with renal complication ([place]) 76/73/4193  . Hyperlipidemia 04/14/2008  . Essential hypertension 04/14/2008  . Allergic rhinitis 04/14/2008    Jamey Reas, PT, DPT 05/20/2020, 3:37 PM  [facility] Physical Therapy [address] [place], [place], [address] Phone: [phone]   Fax:  [phone]  Name: Anthony Bullock MRN: [id number] Date of Birth: 30-Mar-1960

## 2020-05-20 NOTE — Telephone Encounter (Signed)
Patient is calling and requesting a refill for HUMALOG KWIKPEN 200 UNIT/ML KwikPen and Insulin Glargine (BASAGLAR KWIKPEN) 100 UNIT/ML to be sent to   [facility], [place], [place] [address]  Phone:  [phone] Fax:  [phone]  CB is [phone]

## 2020-05-21 DIAGNOSIS — D631 Anemia in chronic kidney disease: Secondary | ICD-10-CM | POA: Diagnosis not present

## 2020-05-21 DIAGNOSIS — D509 Iron deficiency anemia, unspecified: Secondary | ICD-10-CM | POA: Diagnosis not present

## 2020-05-21 DIAGNOSIS — Z992 Dependence on renal dialysis: Secondary | ICD-10-CM | POA: Diagnosis not present

## 2020-05-21 DIAGNOSIS — N186 End stage renal disease: Secondary | ICD-10-CM | POA: Diagnosis not present

## 2020-05-21 DIAGNOSIS — N2581 Secondary hyperparathyroidism of renal origin: Secondary | ICD-10-CM | POA: Diagnosis not present

## 2020-05-21 DIAGNOSIS — E209 Hypoparathyroidism, unspecified: Secondary | ICD-10-CM | POA: Diagnosis not present

## 2020-05-23 DIAGNOSIS — D509 Iron deficiency anemia, unspecified: Secondary | ICD-10-CM | POA: Diagnosis not present

## 2020-05-23 DIAGNOSIS — D631 Anemia in chronic kidney disease: Secondary | ICD-10-CM | POA: Diagnosis not present

## 2020-05-23 DIAGNOSIS — E209 Hypoparathyroidism, unspecified: Secondary | ICD-10-CM | POA: Diagnosis not present

## 2020-05-23 DIAGNOSIS — Z992 Dependence on renal dialysis: Secondary | ICD-10-CM | POA: Diagnosis not present

## 2020-05-23 DIAGNOSIS — N186 End stage renal disease: Secondary | ICD-10-CM | POA: Diagnosis not present

## 2020-05-23 DIAGNOSIS — N2581 Secondary hyperparathyroidism of renal origin: Secondary | ICD-10-CM | POA: Diagnosis not present

## 2020-05-25 ENCOUNTER — Ambulatory Visit (INDEPENDENT_AMBULATORY_CARE_PROVIDER_SITE_OTHER): Payer: Medicare Other | Admitting: Physical Therapy

## 2020-05-25 ENCOUNTER — Other Ambulatory Visit: Payer: Self-pay

## 2020-05-25 ENCOUNTER — Encounter: Payer: Self-pay | Admitting: Physical Therapy

## 2020-05-25 DIAGNOSIS — M6281 Muscle weakness (generalized): Secondary | ICD-10-CM

## 2020-05-25 DIAGNOSIS — R2681 Unsteadiness on feet: Secondary | ICD-10-CM

## 2020-05-25 DIAGNOSIS — R2689 Other abnormalities of gait and mobility: Secondary | ICD-10-CM | POA: Diagnosis not present

## 2020-05-25 NOTE — Therapy (Signed)
[facility] Physical Therapy [address] [place], [place], [address] Phone: [phone]   Fax:  [phone]  Physical Therapy Treatment  Patient Details  Name: Anthony Bullock MRN: [id number] Date of Birth: May 11, 1960 Referring Provider (PT): Bevely Palmer Persons, [place]   Encounter Date: 05/25/2020   PT End of Session - 05/25/20 1253    Visit Number 5    Number of Visits 25    Date for PT Re-Evaluation 07/23/20    Authorization Type Medicare & BCBS supplement    PT Start Time 1230    PT Stop Time 1315    PT Time Calculation (min) 45 min    Equipment Utilized During Treatment Gait belt    Activity Tolerance Patient tolerated treatment well;Patient limited by fatigue    Behavior During Therapy [facility] for tasks assessed/performed           Past Medical History:  Diagnosis Date  . 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 Allergic rhinitis   . Anemia   . Arthritis   . Chronic systolic heart failure ([place])    a. ECHO (12/2012) EF 25-30%, HK entireanteroseptal myocardium //  b.  EF 25%, diffuse HK, grade 1 diastolic dysfunction, MAC, mild LAE, normal RVSF, trivial pericardial effusion  . COPD (chronic obstructive pulmonary disease) ([place])   . Diabetes mellitus type II   . Diabetic nephropathy ([place])   . Diabetic neuropathy ([place])   . ESRD on hemodialysis [facility])    started HD June 2017, goes to [facility] HD unit, Dr Hinda Lenis  . History of cardiac catheterization    a.Myoview 1/15:  There is significant left ventricular dysfunction. There may be slight scar at the apex. There is no significant ischemia. LV Ejection Fraction: 27%  //  b. RHC/LHC (1/15) with mean RA 6, PA 47/22 mean 33, mean PCWP 20, PVR 2.5 WU, CI 2.5; 80% dLAD stenosis, 70% diffuse large D.   . History of kidney stones   . Hyperlipidemia   . Hypertension   . Kidney stones   . NICM (nonischemic cardiomyopathy) ([place])    Primarily nonischemic. Echo (12/14) with EF  25-30%. Echo (3/15) with EF 25%, mild to moderately dilated LV, normal RV size and systolic function.   . Osteomyelitis ([place])    left fifth ray  . Pneumonia   . Urethral stricture   . Wears glasses     Past Surgical History:  Procedure Laterality Date  . ABDOMINAL AORTOGRAM W/LOWER EXTREMITY N/A 03/30/2016   Procedure: Abdominal Aortogram w/Lower Extremity;  Surgeon: Angelia Mould, MD;  Location: [place] CV LAB;  Service: Cardiovascular;  Laterality: N/A;  . AMPUTATION Right 04/26/2016   Procedure: Right Below Knee Amputation;  Surgeon: Newt Minion, MD;  Location: [place];  Service: Orthopedics;  Laterality: Right;  . AMPUTATION Left 08/21/2019   Procedure: LEFT FOOT 5TH RAY AMPUTATION;  Surgeon: Newt Minion, MD;  Location: [place];  Service: Orthopedics;  Laterality: Left;  . AMPUTATION Left 11/13/2019   Procedure: LEFT BELOW KNEE AMPUTATION;  Surgeon: Newt Minion, MD;  Location: [place];  Service: Orthopedics;  Laterality: Left;  . AV FISTULA PLACEMENT Right 09/08/2015   Procedure: INSERTION OF 4-22m x 45cm  ARTERIOVENOUS (AV) GORE-TEX GRAFT RIGHT UPPER  ARM;  Surgeon: CAngelia Mould MD;  [place]  Service: Vascular;  Laterality: Right;  . AV FISTULA PLACEMENT Left 01/14/2016   Procedure: CREATION OF LEFT UPPER ARM ARTERIOVENOUS FISTULA;  Surgeon: CJudeth Cornfield  Scot Dock, MD;  Location: [place];  Service: Vascular;  Laterality: Left;  . BASCILIC VEIN TRANSPOSITION Right 08/22/2014   Procedure: RIGHT UPPER ARM BASCILIC VEIN TRANSPOSITION;  Surgeon: Angelia Mould, MD;  Location: [place];  Service: Vascular;  Laterality: Right;  . BELOW KNEE LEG AMPUTATION Right 04/26/2016  . CARDIAC CATHETERIZATION    . CARDIAC DEFIBRILLATOR PLACEMENT  06/27/2013   Sub Q       BY DR Caryl Comes  . CATARACT EXTRACTION W/PHACO Right 08/06/2018   Procedure: CATARACT EXTRACTION PHACO AND INTRAOCULAR LENS PLACEMENT (IOC);  Surgeon: Baruch Goldmann, MD;  Location: [facility];  Service:  Ophthalmology;  Laterality: Right;  CDE: 4.06  . CATARACT EXTRACTION W/PHACO Left 08/20/2018   Procedure: CATARACT EXTRACTION PHACO AND INTRAOCULAR LENS PLACEMENT (IOC);  Surgeon: Baruch Goldmann, MD;  Location: [facility];  Service: Ophthalmology;  Laterality: Left;  CDE: 6.76  . COLONOSCOPY WITH PROPOFOL N/A 07/22/2015   Procedure: COLONOSCOPY WITH PROPOFOL;  Surgeon: Doran Stabler, MD;  Location: [facility];  Service: Gastroenterology;  Laterality: N/A;  . FEMORAL-POPLITEAL BYPASS GRAFT Right 03/31/2016   Procedure: BYPASS GRAFT FEMORAL-POPLITEAL ARTERY USING RIGHT GREATER SAPHENOUS NONREVERSED VEIN;  Surgeon: Angelia Mould, MD;  Location: [place];  Service: Vascular;  Laterality: Right;  . HERNIA REPAIR    . I & D EXTREMITY Right 03/31/2016   Procedure: IRRIGATION AND DEBRIDEMENT FOOT;  Surgeon: Angelia Mould, MD;  Location: [place];  Service: Vascular;  Laterality: Right;  . IMPLANTABLE CARDIOVERTER DEFIBRILLATOR IMPLANT N/A 06/27/2013   Procedure: SUB Q ICD;  Surgeon: Deboraha Sprang, MD;  Location: [facility] CATH LAB;  Service: Cardiovascular;  Laterality: N/A;  . INTRAOPERATIVE ARTERIOGRAM Right 03/31/2016   Procedure: INTRA OPERATIVE ARTERIOGRAM;  Surgeon: Angelia Mould, MD;  Location: [place];  Service: Vascular;  Laterality: Right;  . IR GENERIC HISTORICAL Right 11/30/2015   IR THROMBECTOMY AV FISTULA W/THROMBOLYSIS/PTA INC/SHUNT/IMG RIGHT 11/30/2015 Aletta Edouard, MD [facility]  . IR GENERIC HISTORICAL  11/30/2015   IR US GUIDE VASC ACCESS RIGHT 11/30/2015 Aletta Edouard, MD [facility]  . IR GENERIC HISTORICAL Right 12/15/2015   IR THROMBECTOMY AV FISTULA W/THROMBOLYSIS/PTA/STENT INC/SHUNT/IMG RT 12/15/2015 Arne Cleveland, MD [facility]  . IR GENERIC HISTORICAL  12/15/2015   IR US GUIDE VASC ACCESS RIGHT 12/15/2015 Arne Cleveland, MD [facility]  . IR GENERIC HISTORICAL  12/28/2015   IR FLUORO GUIDE CV LINE RIGHT 12/28/2015 Marybelle Killings, MD [facility]  . IR GENERIC  HISTORICAL  12/28/2015   IR US GUIDE VASC ACCESS RIGHT 12/28/2015 Marybelle Killings, MD [facility]  . LEFT A ND RIGHT HEART CATH  01/30/2013   DR Sung Amabile  . LEFT AND RIGHT HEART CATHETERIZATION WITH CORONARY ANGIOGRAM N/A 01/30/2013   Procedure: LEFT AND RIGHT HEART CATHETERIZATION WITH CORONARY ANGIOGRAM;  Surgeon: Jolaine Artist, MD;  Location: [facility] CATH LAB;  Service: Cardiovascular;  Laterality: N/A;  . PERIPHERAL VASCULAR CATHETERIZATION Right 01/26/2015   Procedure: A/V Fistulagram;  Surgeon: Angelia Mould, MD;  Location: [place] CV LAB;  Service: Cardiovascular;  Laterality: Right;  . reapea urethral surgery for recurrent obstruction  2011  . TOTAL KNEE ARTHROPLASTY Right 2007  . VEIN HARVEST Right 03/31/2016   Procedure: RIGHT GREATER SAPHENOUS VEIN HARVEST;  Surgeon: Angelia Mould, MD;  Location: [place];  Service: Vascular;  Laterality: Right;    There were no vitals filed for this visit.   Subjective Assessment - 05/25/20 1254    Subjective He is wearing prostheses most  awake hours. He has some soreness on left leg.He is Bevely Palmer Persons, [place] next Wednesday for prescription for new right prosthesis as current is 60 years old.    Patient is accompained by: Family member   wife, Jaycob Mcclenton   Pertinent History Left TTA, automatic cardioverter/defibrillator, arthritis, CAD, cardiomyopathy, CHF, COPD, DM2, neuropathy, ESRD, CAD w/catherization, HTN, kidney stones, right TKA 2007    Patient Stated Goals Walk in home & community.    Currently in Pain? No/denies                             [facility] Adult PT Treatment/Exercise - 05/25/20 1230      Transfers   Sit to Stand 5: Supervision;With upper extremity assist;With armrests;From chair/3-in-1;Other (comment)   to RW   Sit to Stand Details Visual cues for safe use of DME/AE;Verbal cues for sequencing;Verbal cues for technique    Stand to Sit 5: Supervision;With upper extremity assist;With armrests;To  chair/3-in-1;Other (comment)   from RW   Stand to Sit Details (indicate cue type and reason) Visual cues for safe use of DME/AE;Verbal cues for sequencing;Verbal cues for technique      Ambulation/Gait   Ambulation/Gait Yes    Ambulation/Gait Assistance 5: Supervision    Ambulation Distance (Feet) 100 Feet   100' X 2   Assistive device Rolling walker;Prostheses    Gait Pattern Step-through pattern    Ramp 4: Min assist   min guard RW & prostheses   Ramp Details (indicate cue type and reason) verbal & tactile cues on technique with prostheses    Curb 5: Supervision   RW & prostheses   Curb Details (indicate cue type and reason) verbal & tactile cues on technique with prostheses    Gait Comments --      High Level Balance   High Level Balance Activities Side stepping;Backward walking   //bars & bil TTA prostheses   High Level Balance Comments PT demo, verbal, mirror for visual & tactile cues on technique      Neuro Re-ed    Neuro Re-ed Details  --      Exercises   Exercises Other Exercises;Knee/Hip      Knee/Hip Exercises: Stretches   Active Hamstring Stretch Right;Left;2 reps;20 seconds    Active Hamstring Stretch Limitations supine with strap DF      Knee/Hip Exercises: Machines for Strengthening   Total Gym Leg Press shuttle leg press 106# 15 reps 2 sets 1st set back 45* & 2nd set back flat      Knee/Hip Exercises: Standing   Rocker Board 1 minute   ant/level/post & right/level/left   Rocker Board Limitations demo, verbal & tactile cues on technique      Knee/Hip Exercises: Seated   Sit to Sand 1 set;10 reps;without UE support   from 24 inch bar stool in bars     Prosthetics   Prosthetic Care Comments  --    Current prosthetic wear tolerance (days/week)  daily    Current prosthetic wear tolerance (#hours/day)  most of awake hours    Education Provided Residual limb care;Other (comment)   see prosthetic care comments              Balance Exercises - 05/25/20 1300       Balance Exercises: Standing   Standing Eyes Opened Wide (BOA);Solid surface;Foam/compliant surface;Head turns;5 reps    Standing Eyes Opened Limitations all with feet offset slightly & back leg  external rotation - with ea leg back:  on foam static 30sec, on floor head turns right /left & up/down               PT Short Term Goals - 05/25/20 1349      PT SHORT TERM GOAL #1   Title Patient donnes prostheses modified independent & verbalizes proper cleaning.    Time 1    Period Months    Status Achieved    Target Date 05/28/20      PT SHORT TERM GOAL #2   Title Patient tolerates left prosthesis >10 hrs total /day without skin issues    Time 1    Period Months    Status Achieved    Target Date 05/28/20      PT SHORT TERM GOAL #3   Title Patient able to reach 10" with RW support safely    Time 1    Period Months    Status Achieved    Target Date 05/28/20      PT SHORT TERM GOAL #4   Title Patient ambulates 100' with RW & prostheses including sit / stand chair with armrests with supervision    Time 1    Period Months    Status Achieved    Target Date 05/28/20             PT Long Term Goals - 04/29/20 1638      PT LONG TERM GOAL #1   Title Patient demonstrates & verbalized understanding of prosthetic care to enable safe utilization of prostheses.    Time 12    Period Weeks    Status New    Target Date 07/23/20      PT LONG TERM GOAL #2   Title Patient tolerates prostheses wear for >90% of awake hours without skin issues    Time 12    Period Weeks    Status New    Target Date 07/23/20      PT LONG TERM GOAL #3   Title Berg Balance >/= 36/56 to indicate lower fall risk    Time 12    Period Weeks    Status New    Target Date 07/23/20      PT LONG TERM GOAL #4   Title Patient ambulates 300' with LRAD & prostheses modified independent    Time 12    Period Weeks    Status New    Target Date 07/23/20      PT LONG TERM GOAL #5   Title Patient  negotiates ramps, curbs & stairs with LRAD & prostheses modified independent.    Time 12    Period Weeks    Status New    Target Date 07/23/20                 Plan - 05/25/20 1253    Clinical Impression Statement Patient met all STGs for first 30 days.  PT worked on balance activities facilitating residual limb & hip strategies which he improved with instruction & repetition.    Personal Factors and Comorbidities Comorbidity 3+;Fitness;Time since onset of injury/illness/exacerbation    Comorbidities Left TTA, automatic cardioverter/defibrillator, arthritis, CAD, cardiomyopathy, CHF, COPD, DM2, neuropathy, ESRD, CAD w/catherization, HTN, kidney stones, right TKA 2007    Examination-Activity Limitations Lift;Locomotion Level;Squat;Stairs;Stand;Transfers    Examination-Participation Restrictions Driving;Community Activity    Stability/Clinical Decision Making Evolving/Moderate complexity    Rehab Potential Good    PT Frequency 2x / week    PT Duration 12  weeks    PT Treatment/Interventions ADLs/Self Care Home Management;DME Instruction;Gait training;Stair training;Functional mobility training;Therapeutic activities;Therapeutic exercise;Balance training;Neuromuscular re-education;Patient/family education;Prosthetic Training;Vestibular    PT Next Visit Plan set updated STGs,  review prosthetic care, prosthetic gait with RW, therapeutic exercise to increase functional strength & endurance.    Consulted and Agree with Plan of Care Patient;Family member/caregiver    Family Member Consulted wife           Patient will benefit from skilled therapeutic intervention in order to improve the following deficits and impairments:  Abnormal gait,Decreased activity tolerance,Decreased balance,Decreased endurance,Decreased knowledge of use of DME,Decreased mobility,Decreased skin integrity,Decreased strength,Increased edema,Impaired flexibility,Postural dysfunction,Prosthetic Dependency,Pain  Visit  Diagnosis: Other abnormalities of gait and mobility  Unsteadiness on feet  Muscle weakness (generalized)     Problem List Patient Active Problem List   Diagnosis Date Noted  . Malnutrition of moderate degree 11/18/2019  . Below-knee amputation of left lower extremity ([place]) 11/16/2019  . Abscess of left foot 11/13/2019  . Dehiscence of amputation stump ([place])   . History of complete ray amputation of fifth toe of left foot ([place]) 10/09/2019  . Acute pulmonary edema ([facility])   . Osteomyelitis of fifth toe of left foot ([place])   . Febrile illness 08/17/2019  . SIRS (systemic inflammatory response syndrome) ([place]) 08/17/2019  . Thrombocytopenia ([place]) 08/17/2019  . Pressure injury of back, stage 2 ([place]) 08/17/2019  . Hypertensive emergency 07/10/2019  . Onychomycosis 08/16/2016  . Osteopathy in diseases classified elsewhere, unspecified site 05/14/2016  . Status post unilateral below knee amputation, right ([place]) 04/26/2016  . Bilateral carotid bruits 03/30/2016  . Diabetes mellitus with complication ([place])   . Anemia due to chronic kidney disease   . ESRD on dialysis ([place]) 11/30/2015  . Acute respiratory failure with hypoxia ([place]) 11/29/2015  . Dependence on renal dialysis ([place]) 08/15/2015  . Presence of automatic (implantable) cardiac defibrillator 03/12/2015  . At high risk for falls 09/29/2014  . Peripheral vascular disease ([place]) 09/29/2014  . Osteoarthritis of both knees 01/07/2014  . Osteoarthritis of multiple joints 10/06/2013  . NICM (nonischemic cardiomyopathy) ([place]) 06/27/2013  . CAD (coronary artery disease) 03/13/2013  . Abnormal nuclear stress test 01/27/2013  . Chronic systolic congestive heart failure ([place]) 01/27/2013  . COPD (chronic obstructive pulmonary disease) ([place]) 12/27/2012  . Diabetic peripheral neuropathy ([place]) 02/10/2012  . Urethral stricture 10/06/2010  . URINARY CALCULUS 09/28/2009  . Adjustment disorder with depressed mood 08/21/2009  . Poorly controlled  type II diabetes mellitus with renal complication ([place]) 15/61/5379  . Hyperlipidemia 04/14/2008  . Essential hypertension 04/14/2008  . Allergic rhinitis 04/14/2008    Jamey Reas, PT, DPT 05/25/2020, 1:51 PM  [facility] Physical Therapy [address] [place], [place], [address] Phone: [phone]   Fax:  [phone]  Name: SHOJI PERTUIT MRN: [id number] Date of Birth: Apr 26, 1960

## 2020-05-26 DIAGNOSIS — E209 Hypoparathyroidism, unspecified: Secondary | ICD-10-CM | POA: Diagnosis not present

## 2020-05-26 DIAGNOSIS — Z992 Dependence on renal dialysis: Secondary | ICD-10-CM | POA: Diagnosis not present

## 2020-05-26 DIAGNOSIS — D509 Iron deficiency anemia, unspecified: Secondary | ICD-10-CM | POA: Diagnosis not present

## 2020-05-26 DIAGNOSIS — N186 End stage renal disease: Secondary | ICD-10-CM | POA: Diagnosis not present

## 2020-05-26 DIAGNOSIS — N2581 Secondary hyperparathyroidism of renal origin: Secondary | ICD-10-CM | POA: Diagnosis not present

## 2020-05-26 DIAGNOSIS — D631 Anemia in chronic kidney disease: Secondary | ICD-10-CM | POA: Diagnosis not present

## 2020-05-27 ENCOUNTER — Ambulatory Visit (INDEPENDENT_AMBULATORY_CARE_PROVIDER_SITE_OTHER): Payer: Medicare Other | Admitting: Physical Therapy

## 2020-05-27 ENCOUNTER — Other Ambulatory Visit: Payer: Self-pay

## 2020-05-27 ENCOUNTER — Encounter: Payer: Self-pay | Admitting: Physical Therapy

## 2020-05-27 DIAGNOSIS — R2689 Other abnormalities of gait and mobility: Secondary | ICD-10-CM

## 2020-05-27 DIAGNOSIS — R2681 Unsteadiness on feet: Secondary | ICD-10-CM

## 2020-05-27 DIAGNOSIS — M6281 Muscle weakness (generalized): Secondary | ICD-10-CM

## 2020-05-27 NOTE — Therapy (Signed)
[facility] Physical Therapy [address] [place], [place], [address] Phone: [phone]   Fax:  [phone]  Physical Therapy Treatment  Patient Details  Name: Anthony Bullock MRN: [id number] Date of Birth: [birth year] Referring Provider (PT): Bevely Palmer Persons, [place]   Encounter Date: 05/27/2020   PT End of Session - 05/27/20 1427    Visit Number 6    Number of Visits 25    Date for PT Re-Evaluation 07/23/20    Authorization Type Medicare & BCBS supplement    PT Start Time 1430    PT Stop Time 1515    PT Time Calculation (min) 45 min    Equipment Utilized During Treatment Gait belt    Activity Tolerance Patient tolerated treatment well;Patient limited by fatigue    Behavior During Therapy [facility] for tasks assessed/performed           Past Medical History:  Diagnosis Date  . 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 Allergic rhinitis   . Anemia   . Arthritis   . Chronic systolic heart failure ([place])    a. ECHO (12/2012) EF 25-30%, HK entireanteroseptal myocardium //  b.  EF 25%, diffuse HK, grade 1 diastolic dysfunction, MAC, mild LAE, normal RVSF, trivial pericardial effusion  . COPD (chronic obstructive pulmonary disease) ([place])   . Diabetes mellitus type II   . Diabetic nephropathy ([place])   . Diabetic neuropathy ([place])   . ESRD on hemodialysis [facility])    started HD June 2017, goes to [facility] HD unit, Dr Hinda Lenis  . History of cardiac catheterization    a.Myoview 1/15:  There is significant left ventricular dysfunction. There may be slight scar at the apex. There is no significant ischemia. LV Ejection Fraction: 27%  //  b. RHC/LHC (1/15) with mean RA 6, PA 47/22 mean 33, mean PCWP 20, PVR 2.5 WU, CI 2.5; 80% dLAD stenosis, 70% diffuse large D.   . History of kidney stones   . Hyperlipidemia   . Hypertension   . Kidney stones   . NICM (nonischemic cardiomyopathy) ([place])    Primarily nonischemic. Echo (12/14) with EF  25-30%. Echo (3/15) with EF 25%, mild to moderately dilated LV, normal RV size and systolic function.   . Osteomyelitis ([place])    left fifth ray  . Pneumonia   . Urethral stricture   . Wears glasses     Past Surgical History:  Procedure Laterality Date  . ABDOMINAL AORTOGRAM W/LOWER EXTREMITY N/A 03/30/2016   Procedure: Abdominal Aortogram w/Lower Extremity;  Surgeon: Angelia Mould, MD;  Location: [place] CV LAB;  Service: Cardiovascular;  Laterality: N/A;  . AMPUTATION Right 04/26/2016   Procedure: Right Below Knee Amputation;  Surgeon: Newt Minion, MD;  Location: [place];  Service: Orthopedics;  Laterality: Right;  . AMPUTATION Left 08/21/2019   Procedure: LEFT FOOT 5TH RAY AMPUTATION;  Surgeon: Newt Minion, MD;  Location: [place];  Service: Orthopedics;  Laterality: Left;  . AMPUTATION Left 11/13/2019   Procedure: LEFT BELOW KNEE AMPUTATION;  Surgeon: Newt Minion, MD;  Location: [place];  Service: Orthopedics;  Laterality: Left;  . AV FISTULA PLACEMENT Right 09/08/2015   Procedure: INSERTION OF 4-55m x 45cm  ARTERIOVENOUS (AV) GORE-TEX GRAFT RIGHT UPPER  ARM;  Surgeon: CAngelia Mould MD;  [place]  Service: Vascular;  Laterality: Right;  . AV FISTULA PLACEMENT Left 01/14/2016   Procedure: CREATION OF LEFT UPPER ARM ARTERIOVENOUS FISTULA;  Surgeon: CJudeth Cornfield  Scot Dock, MD;  Location: [place];  Service: Vascular;  Laterality: Left;  . BASCILIC VEIN TRANSPOSITION Right 08/22/2014   Procedure: RIGHT UPPER ARM BASCILIC VEIN TRANSPOSITION;  Surgeon: Angelia Mould, MD;  Location: [place];  Service: Vascular;  Laterality: Right;  . BELOW KNEE LEG AMPUTATION Right 04/26/2016  . CARDIAC CATHETERIZATION    . CARDIAC DEFIBRILLATOR PLACEMENT  06/27/2013   Sub Q       BY DR Caryl Comes  . CATARACT EXTRACTION W/PHACO Right 08/06/2018   Procedure: CATARACT EXTRACTION PHACO AND INTRAOCULAR LENS PLACEMENT (IOC);  Surgeon: Baruch Goldmann, MD;  Location: [facility];  Service:  Ophthalmology;  Laterality: Right;  CDE: 4.06  . CATARACT EXTRACTION W/PHACO Left 08/20/2018   Procedure: CATARACT EXTRACTION PHACO AND INTRAOCULAR LENS PLACEMENT (IOC);  Surgeon: Baruch Goldmann, MD;  Location: [facility];  Service: Ophthalmology;  Laterality: Left;  CDE: 6.76  . COLONOSCOPY WITH PROPOFOL N/A 07/22/2015   Procedure: COLONOSCOPY WITH PROPOFOL;  Surgeon: Doran Stabler, MD;  Location: [facility];  Service: Gastroenterology;  Laterality: N/A;  . FEMORAL-POPLITEAL BYPASS GRAFT Right 03/31/2016   Procedure: BYPASS GRAFT FEMORAL-POPLITEAL ARTERY USING RIGHT GREATER SAPHENOUS NONREVERSED VEIN;  Surgeon: Angelia Mould, MD;  Location: [place];  Service: Vascular;  Laterality: Right;  . HERNIA REPAIR    . I & D EXTREMITY Right 03/31/2016   Procedure: IRRIGATION AND DEBRIDEMENT FOOT;  Surgeon: Angelia Mould, MD;  Location: [place];  Service: Vascular;  Laterality: Right;  . IMPLANTABLE CARDIOVERTER DEFIBRILLATOR IMPLANT N/A 06/27/2013   Procedure: SUB Q ICD;  Surgeon: Deboraha Sprang, MD;  Location: [facility] CATH LAB;  Service: Cardiovascular;  Laterality: N/A;  . INTRAOPERATIVE ARTERIOGRAM Right 03/31/2016   Procedure: INTRA OPERATIVE ARTERIOGRAM;  Surgeon: Angelia Mould, MD;  Location: [place];  Service: Vascular;  Laterality: Right;  . IR GENERIC HISTORICAL Right 11/30/2015   IR THROMBECTOMY AV FISTULA W/THROMBOLYSIS/PTA INC/SHUNT/IMG RIGHT 11/30/2015 Aletta Edouard, MD [facility]  . IR GENERIC HISTORICAL  11/30/2015   IR US GUIDE VASC ACCESS RIGHT 11/30/2015 Aletta Edouard, MD [facility]  . IR GENERIC HISTORICAL Right 12/15/2015   IR THROMBECTOMY AV FISTULA W/THROMBOLYSIS/PTA/STENT INC/SHUNT/IMG RT 12/15/2015 Arne Cleveland, MD [facility]  . IR GENERIC HISTORICAL  12/15/2015   IR US GUIDE VASC ACCESS RIGHT 12/15/2015 Arne Cleveland, MD [facility]  . IR GENERIC HISTORICAL  12/28/2015   IR FLUORO GUIDE CV LINE RIGHT 12/28/2015 Marybelle Killings, MD [facility]  . IR GENERIC  HISTORICAL  12/28/2015   IR US GUIDE VASC ACCESS RIGHT 12/28/2015 Marybelle Killings, MD [facility]  . LEFT A ND RIGHT HEART CATH  01/30/2013   DR Sung Amabile  . LEFT AND RIGHT HEART CATHETERIZATION WITH CORONARY ANGIOGRAM N/A 01/30/2013   Procedure: LEFT AND RIGHT HEART CATHETERIZATION WITH CORONARY ANGIOGRAM;  Surgeon: Jolaine Artist, MD;  Location: [facility] C[facility] LAB;  Service: Cardiovascular;  Laterality: N/A;  . PERIPHERAL VASCULAR CATHETERIZATION Right 01/26/2015   Procedure: A/V Fistulagram;  Surgeon: Angelia Mould, MD;  Location: [place] CV LAB;  Service: Cardiovascular;  Laterality: Right;  . reapea urethral surgery for recurrent obstruction  2011  . TOTAL KNEE ARTHROPLASTY Right 2007  . VEIN HARVEST Right 03/31/2016   Procedure: RIGHT GREATER SAPHENOUS VEIN HARVEST;  Surgeon: Angelia Mould, MD;  Location: [place];  Service: Vascular;  Laterality: Right;    There were no vitals filed for this visit.   Subjective Assessment - 05/27/20 1428    Subjective No changes since PT on  Monday.    Patient is accompained by: Family member   wife, Demorris Choyce   Pertinent History Left TTA, automatic cardioverter/defibrillator, arthritis, CAD, cardiomyopathy, CHF, COPD, DM2, neuropathy, ESRD, CAD w/catherization, HTN, kidney stones, right TKA 2007    Patient Stated Goals Walk in home & community.    Currently in Pain? No/denies                             [facility] Adult PT Treatment/Exercise - 05/27/20 1428      Transfers   Sit to Stand 5: Supervision;With upper extremity assist;With armrests;From chair/3-in-1;Other (comment)   to RW   Sit to Stand Details Visual cues for safe use of DME/AE;Verbal cues for sequencing;Verbal cues for technique    Stand to Sit 5: Supervision;With upper extremity assist;With armrests;To chair/3-in-1;Other (comment)   from RW   Stand to Sit Details (indicate cue type and reason) Visual cues for safe use of DME/AE;Verbal cues for  sequencing;Verbal cues for technique      Ambulation/Gait   Ambulation/Gait Yes    Ambulation/Gait Assistance 5: Supervision    Ambulation Distance (Feet) 100 Feet   100' X 2   Assistive device Rolling walker;Prostheses    Gait Pattern Step-through pattern    Stairs Yes    Stairs Assistance 4: Min guard    Stairs Assistance Details (indicate cue type and reason) PT demo, verbal & tactile cues on technique    Stair Management Technique Two rails;Alternating pattern;Forwards;Step to pattern   descend step-to but alternating lead LE, ascend alternating.   Number of Stairs 11   seated rest at bottom of stairs   Height of Stairs 6    Ramp 4: Min assist   min guard RW & prostheses   Ramp Details (indicate cue type and reason) verbal & tactile cues on technique with prostheses    Curb 5: Supervision   RW & prostheses   Curb Details (indicate cue type and reason) verbal & tactile cues on technique with prostheses      High Level Balance   High Level Balance Activities Side stepping;Backward walking   //bars & bil TTA prostheses   High Level Balance Comments PT demo, verbal, mirror for visual & tactile cues on technique      Neuro Re-ed    Neuro Re-ed Details  standing hip width red theraband UE alternating & BUEs rows, forward punch & overhead reach 10 reps ea. with contact assist inside //bars for safety      Exercises   Exercises Other Exercises;Knee/Hip      Knee/Hip Exercises: Stretches   Active Hamstring Stretch Right;Left;2 reps;30 seconds    Active Hamstring Stretch Limitations supine with strap DF      Knee/Hip Exercises: Machines for Strengthening   Total Gym Leg Press shuttle leg press 106# 15 reps 1st set back 45* & only 6 reps 2nd set back flat  single leg stance on ea LE by lifting other LE off plate for ~6QIW.      Knee/Hip Exercises: Standing   Rocker Board 1 minute   square board w/2 pivot points, ant/level/post & right/level/left   Rocker Board Limitations verbal &  tactile cues on technique      Knee/Hip Exercises: Seated   Sit to Sand 1 set;10 reps;without UE support   from 24 inch bar stool in bars     Prosthetics   Current prosthetic wear tolerance (days/week)  daily    Current  prosthetic wear tolerance (#hours/day)  most of awake hours    Education Provided --                    PT Short Term Goals - 05/27/20 1628      PT SHORT TERM GOAL #1   Title Patient verbalizes proper sweat management with prostheses.    Time 1    Period Months    Status Revised    Target Date 06/26/20      PT SHORT TERM GOAL #2   Title Patient tolerates bilateral prostheses >90% awake hrs total /day without skin issues    Time 1    Period Months    Status Revised    Target Date 06/26/20      PT SHORT TERM GOAL #3   Title Patient able to reach 10" without UE support safely    Time 1    Period Months    Status Revised    Target Date 06/26/20      PT SHORT TERM GOAL #4   Title Patient ambulates 100' with cane & prostheses including sit / stand chair with armrests with supervision    Time 1    Period Months    Status Revised    Target Date 06/26/20      PT SHORT TERM GOAL #5   Title Patient negotiates ramps & curbs with cane & prostheses with minA.    Time 1    Period Months    Status New    Target Date 06/26/20             PT Long Term Goals - 04/29/20 1638      PT LONG TERM GOAL #1   Title Patient demonstrates & verbalized understanding of prosthetic care to enable safe utilization of prostheses.    Time 12    Period Weeks    Status New    Target Date 07/23/20      PT LONG TERM GOAL #2   Title Patient tolerates prostheses wear for >90% of awake hours without skin issues    Time 12    Period Weeks    Status New    Target Date 07/23/20      PT LONG TERM GOAL #3   Title Berg Balance >/= 36/56 to indicate lower fall risk    Time 12    Period Weeks    Status New    Target Date 07/23/20      PT LONG TERM GOAL #4   Title  Patient ambulates 300' with LRAD & prostheses modified independent    Time 12    Period Weeks    Status New    Target Date 07/23/20      PT LONG TERM GOAL #5   Title Patient negotiates ramps, curbs & stairs with LRAD & prostheses modified independent.    Time 12    Period Weeks    Status New    Target Date 07/23/20                 Plan - 05/27/20 1427    Clinical Impression Statement PT progressed leg press to include stance stabilization activity which fatigued his muscles.  PT began standing resistive UE exercises for staiblization which he tolerated well.  PT also began negotiating stairs which he did well but fatigued.    Personal Factors and Comorbidities Comorbidity 3+;Fitness;Time since onset of injury/illness/exacerbation    Comorbidities Left TTA, automatic cardioverter/defibrillator, arthritis, CAD, cardiomyopathy,  CHF, COPD, DM2, neuropathy, ESRD, CAD w/catherization, HTN, kidney stones, right TKA 2007    Examination-Activity Limitations Lift;Locomotion Level;Squat;Stairs;Stand;Transfers    Examination-Participation Restrictions Driving;Community Activity    Stability/Clinical Decision Making Evolving/Moderate complexity    Rehab Potential Good    PT Frequency 2x / week    PT Duration 12 weeks    PT Treatment/Interventions ADLs/Self Care Home Management;DME Instruction;Gait training;Stair training;Functional mobility training;Therapeutic activities;Therapeutic exercise;Balance training;Neuromuscular re-education;Patient/family education;Prosthetic Training;Vestibular    PT Next Visit Plan work towards updated STGs,  review prosthetic care, prosthetic gait with cane, therapeutic exercise to increase functional strength & endurance.    Consulted and Agree with Plan of Care Patient    Family Member Consulted --           Patient will benefit from skilled therapeutic intervention in order to improve the following deficits and impairments:  Abnormal gait,Decreased  activity tolerance,Decreased balance,Decreased endurance,Decreased knowledge of use of DME,Decreased mobility,Decreased skin integrity,Decreased strength,Increased edema,Impaired flexibility,Postural dysfunction,Prosthetic Dependency,Pain  Visit Diagnosis: Other abnormalities of gait and mobility  Unsteadiness on feet  Muscle weakness (generalized)     Problem List Patient Active Problem List   Diagnosis Date Noted  . Malnutrition of moderate degree 11/18/2019  . Below-knee amputation of left lower extremity ([place]) 11/16/2019  . Abscess of left foot 11/13/2019  . Dehiscence of amputation stump ([place])   . History of complete ray amputation of fifth toe of left foot ([place]) 10/09/2019  . Acute pulmonary edema ([facility])   . Osteomyelitis of fifth toe of left foot ([place])   . Febrile illness 08/17/2019  . SIRS (systemic inflammatory response syndrome) ([place]) 08/17/2019  . Thrombocytopenia ([place]) 08/17/2019  . Pressure injury of back, stage 2 ([place]) 08/17/2019  . Hypertensive emergency 07/10/2019  . Onychomycosis 08/16/2016  . Osteopathy in diseases classified elsewhere, unspecified site 05/14/2016  . Status post unilateral below knee amputation, right ([place]) 04/26/2016  . Bilateral carotid bruits 03/30/2016  . Diabetes mellitus with complication ([place])   . Anemia due to chronic kidney disease   . ESRD on dialysis ([place]) 11/30/2015  . Acute respiratory failure with hypoxia ([place]) 11/29/2015  . Dependence on renal dialysis ([place]) 08/15/2015  . Presence of automatic (implantable) cardiac defibrillator 03/12/2015  . At high risk for falls 09/29/2014  . Peripheral vascular disease ([place]) 09/29/2014  . Osteoarthritis of both knees 01/07/2014  . Osteoarthritis of multiple joints 10/06/2013  . NICM (nonischemic cardiomyopathy) ([place]) 06/27/2013  . CAD (coronary artery disease) 03/13/2013  . Abnormal nuclear stress test 01/27/2013  . Chronic systolic congestive heart failure ([place]) 01/27/2013  . COPD  (chronic obstructive pulmonary disease) ([place]) 12/27/2012  . Diabetic peripheral neuropathy ([place]) 02/10/2012  . Urethral stricture 10/06/2010  . URINARY CALCULUS 09/28/2009  . Adjustment disorder with depressed mood 08/21/2009  . Poorly controlled type II diabetes mellitus with renal complication ([place]) 36/64/4034  . Hyperlipidemia 04/14/2008  . Essential hypertension 04/14/2008  . Allergic rhinitis 04/14/2008    Jamey Reas, PT, DPT 05/27/2020, 4:33 PM  [facility] Therapy [address] [place], [place], [address] Phone: [phone]   Fax:  [phone]  Name: RALLY OUCH MRN: [id number] Date of Birth: [birth year]

## 2020-05-28 DIAGNOSIS — Z992 Dependence on renal dialysis: Secondary | ICD-10-CM | POA: Diagnosis not present

## 2020-05-28 DIAGNOSIS — E209 Hypoparathyroidism, unspecified: Secondary | ICD-10-CM | POA: Diagnosis not present

## 2020-05-28 DIAGNOSIS — D631 Anemia in chronic kidney disease: Secondary | ICD-10-CM | POA: Diagnosis not present

## 2020-05-28 DIAGNOSIS — N186 End stage renal disease: Secondary | ICD-10-CM | POA: Diagnosis not present

## 2020-05-28 DIAGNOSIS — D509 Iron deficiency anemia, unspecified: Secondary | ICD-10-CM | POA: Diagnosis not present

## 2020-05-28 DIAGNOSIS — N2581 Secondary hyperparathyroidism of renal origin: Secondary | ICD-10-CM | POA: Diagnosis not present

## 2020-05-29 MED ORDER — BASAGLAR KWIKPEN 100 UNIT/ML ~~LOC~~ SOPN: PEN_INJECTOR | SUBCUTANEOUS | 11 refills | Status: DC

## 2020-05-29 NOTE — Telephone Encounter (Signed)
Patient is calling back and stated that he still hasn't received his Insulin Glargine (BASAGLAR KWIKPEN) 100 UNIT/ML and would like for the request be sent to   [place], [place] [address], [place] [address]  Phone:  [phone] Fax:  [phone]  CB is [phone]

## 2020-05-29 NOTE — Addendum Note (Signed)
Addended by: [name] on: 05/29/2020 04:44 PM   Modules accepted: Orders

## 2020-05-30 DIAGNOSIS — N186 End stage renal disease: Secondary | ICD-10-CM | POA: Diagnosis not present

## 2020-05-30 DIAGNOSIS — D631 Anemia in chronic kidney disease: Secondary | ICD-10-CM | POA: Diagnosis not present

## 2020-05-30 DIAGNOSIS — E209 Hypoparathyroidism, unspecified: Secondary | ICD-10-CM | POA: Diagnosis not present

## 2020-05-30 DIAGNOSIS — Z992 Dependence on renal dialysis: Secondary | ICD-10-CM | POA: Diagnosis not present

## 2020-05-30 DIAGNOSIS — N2581 Secondary hyperparathyroidism of renal origin: Secondary | ICD-10-CM | POA: Diagnosis not present

## 2020-05-30 DIAGNOSIS — D509 Iron deficiency anemia, unspecified: Secondary | ICD-10-CM | POA: Diagnosis not present

## 2020-06-01 ENCOUNTER — Other Ambulatory Visit: Payer: Self-pay | Admitting: Family Medicine

## 2020-06-01 ENCOUNTER — Ambulatory Visit (INDEPENDENT_AMBULATORY_CARE_PROVIDER_SITE_OTHER): Payer: Medicare Other | Admitting: Physical Therapy

## 2020-06-01 ENCOUNTER — Encounter: Payer: Self-pay | Admitting: Physical Therapy

## 2020-06-01 ENCOUNTER — Other Ambulatory Visit: Payer: Self-pay

## 2020-06-01 DIAGNOSIS — M6281 Muscle weakness (generalized): Secondary | ICD-10-CM | POA: Diagnosis not present

## 2020-06-01 DIAGNOSIS — R2681 Unsteadiness on feet: Secondary | ICD-10-CM

## 2020-06-01 DIAGNOSIS — R2689 Other abnormalities of gait and mobility: Secondary | ICD-10-CM

## 2020-06-01 NOTE — Therapy (Signed)
[facility] [address] [place], [place], [address] Phone: [phone]   Fax:  [phone]  Physical Therapy Treatment  Patient Details  Name: Anthony Bullock MRN: [id number] Date of Birth: [birth year] Referring Provider (PT): Bevely Palmer Persons, [place]   Encounter Date: 06/01/2020   PT End of Session - 06/01/20 1256    Visit Number 7    Number of Visits 25    Date for PT Re-Evaluation 07/23/20    Authorization Type Medicare & BCBS supplement    PT Start Time 1256    PT Stop Time 1340    PT Time Calculation (min) 44 min    Equipment Utilized During Treatment Gait belt    Activity Tolerance Patient tolerated treatment well;Patient limited by fatigue    Behavior During Therapy [facility] for tasks assessed/performed           Past Medical History:  Diagnosis Date  . 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 Allergic rhinitis   . Anemia   . Arthritis   . Chronic systolic heart failure ([place])    a. ECHO (12/2012) EF 25-30%, HK entireanteroseptal myocardium //  b.  EF 25%, diffuse HK, grade 1 diastolic dysfunction, MAC, mild LAE, normal RVSF, trivial pericardial effusion  . COPD (chronic obstructive pulmonary disease) ([place])   . Diabetes mellitus type II   . Diabetic nephropathy ([place])   . Diabetic neuropathy ([place])   . ESRD on hemodialysis [facility])    started HD June 2017, [facility] to [facility], Dr Hinda Lenis  . History of cardiac catheterization    a.Myoview 1/15:  There is significant left ventricular dysfunction. There may be slight scar at the apex. There is no significant ischemia. LV Ejection Fraction: 27%  //  b. RHC/LHC (1/15) with mean RA 6, PA 47/22 mean 33, mean PCWP 20, PVR 2.5 WU, CI 2.5; 80% dLAD stenosis, 70% diffuse large D.   . History of kidney stones   . Hyperlipidemia   . Hypertension   . Kidney stones   . NICM (nonischemic cardiomyopathy) ([place])    Primarily nonischemic. Echo (12/14) with EF  25-30%. Echo (3/15) with EF 25%, mild to moderately dilated LV, normal RV size and systolic function.   . Osteomyelitis ([place])    left fifth ray  . Pneumonia   . Urethral stricture   . Wears glasses     Past Surgical History:  Procedure Laterality Date  . ABDOMINAL AORTOGRAM W/LOWER EXTREMITY N/A 03/30/2016   Procedure: Abdominal Aortogram w/Lower Extremity;  Surgeon: Angelia Mould, MD;  Location: [place] [facility];  Service: Cardiovascular;  Laterality: N/A;  . AMPUTATION Right 04/26/2016   Procedure: Right Below Knee Amputation;  Surgeon: Newt Minion, MD;  Location: [place];  Service: Orthopedics;  Laterality: Right;  . AMPUTATION Left 08/21/2019   Procedure: LEFT FOOT 5TH RAY AMPUTATION;  Surgeon: Newt Minion, MD;  Location: [place];  Service: Orthopedics;  Laterality: Left;  . AMPUTATION Left 11/13/2019   Procedure: LEFT BELOW KNEE AMPUTATION;  Surgeon: Newt Minion, MD;  Location: [place];  Service: Orthopedics;  Laterality: Left;  . AV FISTULA PLACEMENT Right 09/08/2015   Procedure: INSERTION OF 4-27m x 45cm  ARTERIOVENOUS (AV) GORE-TEX GRAFT RIGHT UPPER  ARM;  Surgeon: CAngelia Mould MD;  Location: [place]  Service: Vascular;  Laterality: Right;  . AV FISTULA PLACEMENT Left 01/14/2016   Procedure: CREATION OF LEFT UPPER ARM ARTERIOVENOUS FISTULA;  Surgeon: CJudeth Cornfield  Scot Dock, MD;  Location: [place];  Service: Vascular;  Laterality: Left;  . BASCILIC VEIN TRANSPOSITION Right 08/22/2014   Procedure: RIGHT UPPER ARM BASCILIC VEIN TRANSPOSITION;  Surgeon: Angelia Mould, MD;  Location: [place];  Service: Vascular;  Laterality: Right;  . BELOW KNEE LEG AMPUTATION Right 04/26/2016  . CARDIAC CATHETERIZATION    . CARDIAC DEFIBRILLATOR PLACEMENT  06/27/2013   Sub Q       BY DR Caryl Comes  . CATARACT EXTRACTION W/PHACO Right 08/06/2018   Procedure: CATARACT EXTRACTION PHACO AND INTRAOCULAR LENS PLACEMENT (IOC);  Surgeon: Baruch Goldmann, MD;  Location: [facility];  Service:  Ophthalmology;  Laterality: Right;  CDE: 4.06  . CATARACT EXTRACTION W/PHACO Left 08/20/2018   Procedure: CATARACT EXTRACTION PHACO AND INTRAOCULAR LENS PLACEMENT (IOC);  Surgeon: Baruch Goldmann, MD;  Location: [facility];  Service: Ophthalmology;  Laterality: Left;  CDE: 6.76  . COLONOSCOPY WITH PROPOFOL N/A 07/22/2015   Procedure: COLONOSCOPY WITH PROPOFOL;  Surgeon: Doran Stabler, MD;  Location: [facility];  Service: Gastroenterology;  Laterality: N/A;  . FEMORAL-POPLITEAL BYPASS GRAFT Right 03/31/2016   Procedure: BYPASS GRAFT FEMORAL-POPLITEAL ARTERY USING RIGHT GREATER SAPHENOUS NONREVERSED VEIN;  Surgeon: Angelia Mould, MD;  Location: [place];  Service: Vascular;  Laterality: Right;  . HERNIA REPAIR    . I & D EXTREMITY Right 03/31/2016   Procedure: IRRIGATION AND DEBRIDEMENT FOOT;  Surgeon: Angelia Mould, MD;  Location: [place];  Service: Vascular;  Laterality: Right;  . IMPLANTABLE CARDIOVERTER DEFIBRILLATOR IMPLANT N/A 06/27/2013   Procedure: SUB Q ICD;  Surgeon: Deboraha Sprang, MD;  Location: [facility] C[facility] LAB;  Service: Cardiovascular;  Laterality: N/A;  . INTRAOPERATIVE ARTERIOGRAM Right 03/31/2016   Procedure: INTRA OPERATIVE ARTERIOGRAM;  Surgeon: Angelia Mould, MD;  Location: [place];  Service: Vascular;  Laterality: Right;  . IR GENERIC HISTORICAL Right 11/30/2015   IR THROMBECTOMY AV FISTULA W/THROMBOLYSIS/PTA INC/SHUNT/IMG RIGHT 11/30/2015 Aletta Edouard, MD [facility]  . IR GENERIC HISTORICAL  11/30/2015   IR US GUIDE VASC ACCESS RIGHT 11/30/2015 Aletta Edouard, MD [facility]  . IR GENERIC HISTORICAL Right 12/15/2015   IR THROMBECTOMY AV FISTULA W/THROMBOLYSIS/PTA/STENT INC/SHUNT/IMG RT 12/15/2015 Arne Cleveland, MD [facility]  . IR GENERIC HISTORICAL  12/15/2015   IR US GUIDE VASC ACCESS RIGHT 12/15/2015 Arne Cleveland, MD [facility]  . IR GENERIC HISTORICAL  12/28/2015   IR FLUORO GUIDE CV LINE RIGHT 12/28/2015 Marybelle Killings, MD [facility]  . IR GENERIC  HISTORICAL  12/28/2015   IR US GUIDE VASC ACCESS RIGHT 12/28/2015 Marybelle Killings, MD [facility]  . LEFT A ND RIGHT HEART CATH  01/30/2013   DR Sung Amabile  . LEFT AND RIGHT HEART CATHETERIZATION WITH CORONARY ANGIOGRAM N/A 01/30/2013   Procedure: LEFT AND RIGHT HEART CATHETERIZATION WITH CORONARY ANGIOGRAM;  Surgeon: Jolaine Artist, MD;  Location: [facility] C[facility];  Service: Cardiovascular;  Laterality: N/A;  . PERIPHERAL VASCULAR CATHETERIZATION Right 01/26/2015   Procedure: A/V Fistulagram;  Surgeon: Angelia Mould, MD;  Location: [place] [facility];  Service: Cardiovascular;  Laterality: Right;  . reapea urethral surgery for recurrent obstruction  2011  . TOTAL KNEE ARTHROPLASTY Right 2007  . VEIN HARVEST Right 03/31/2016   Procedure: RIGHT GREATER SAPHENOUS VEIN HARVEST;  Surgeon: Angelia Mould, MD;  Location: [place];  Service: Vascular;  Laterality: Right;    There were no vitals filed for this visit.   Subjective Assessment - 06/01/20 1256    Subjective He developed wound on outside  of left leg on Saturday.  He canceled appointment for Wednesday with Dr. Sharol Given for now due to wound. He wants to get left correct prior to working on new right prosthesis.    Patient is accompained by: Family member   wife, Yakir Wenke   Pertinent History Left TTA, automatic cardioverter/defibrillator, arthritis, CAD, cardiomyopathy, CHF, COPD, DM2, neuropathy, ESRD, CAD w/catherization, HTN, kidney stones, right TKA 2007    Patient Stated Goals Walk in home & community.    Currently in Pain? No/denies                             [facility] Adult PT Treatment/Exercise - 06/01/20 1257      Transfers   Sit to Stand 5: Supervision;With upper extremity assist;With armrests;From chair/3-in-1;Other (comment)   to RW   Sit to Stand Details Visual cues for safe use of DME/AE;Verbal cues for sequencing;Verbal cues for technique    Stand to Sit 5: Supervision;With upper extremity assist;With  armrests;To chair/3-in-1;Other (comment)   from RW   Stand to Sit Details (indicate cue type and reason) Visual cues for safe use of DME/AE;Verbal cues for sequencing;Verbal cues for technique      Ambulation/Gait   Ambulation/Gait Yes    Ambulation/Gait Assistance 5: Supervision    Ambulation/Gait Assistance Details inside //bars ambulating without device with intermittent touch. Verbal cues on upright posture & wt shift over stance limb    Ambulation Distance (Feet) 100 Feet   100' X 2 with RW, 80' total inside //bars without UE support.   Assistive device Rolling walker;Prostheses;None    Gait Pattern --    Stairs --    Stairs Assistance --    Stair Management Technique --    Number of Stairs --    Height of Stairs --    Ramp --    Curb --      High Level Balance   High Level Balance Activities Side stepping;Backward walking   //bars & bil TTA prostheses   High Level Balance Comments PT demo, verbal, mirror for visual & tactile cues on technique      Neuro Re-ed    Neuro Re-ed Details  standing hip width red theraband UE alternating & BUEs rows, forward punch & overhead reach 10 reps ea. with contact assist inside //bars for safety      Exercises   Exercises Other Exercises;Knee/Hip      Knee/Hip Exercises: Stretches   Active Hamstring Stretch Right;Left;2 reps;30 seconds    Active Hamstring Stretch Limitations --      Knee/Hip Exercises: Machines for Strengthening   Total Gym Leg Press --      Knee/Hip Exercises: Standing   Terminal Knee Extension Strengthening;Right;Left;3 sets;10 reps;Theraband   1st set bilateral stance, 2nd set step forward / initial contact & 3rd set step back/ terminal stance   Theraband Level (Terminal Knee Extension) Level 3 (Green)    Terminal Knee Extension Limitations tactile & verbal cues on technique    Forward Step Up Right;Left;1 set;Hand Hold: 2;Step Height: 6";5 reps    Forward Step Up Limitations demo, tactile & verbal cues on technique     Step Down Right;Left;1 set;Hand Hold: 2;Step Height: 6";5 reps    Step Down Limitations demo, tactile & verbal cues on technique    Rocker Board 1 minute   round board w/1 pivot points, ant/level/post & right/level/left   Rocker Board Limitations verbal & tactile cues on technique  Knee/Hip Exercises: Seated   Sit to Sand 1 set;10 reps;without UE support   from 24 inch bar stool in bars     Prosthetics   Prosthetic Care Comments  Verbal cues on using cut off Vivewear shrinker under liner for wound healing. 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  PT recommended keeping appt with Dr. Sharol Given.  PT reviewed adjusting ply socks which may change as walking with prostheses pumps fluid out of limbs & dialysis changes limb volume.  If he does not adjust ply socks then reliefs in socket are in wrong locations & may be factor in wound.    Current prosthetic wear tolerance (days/week)  daily    Current prosthetic wear tolerance (#hours/day)  most of awake hours    Edema pitting flucutating with dialysis    Residual limb condition  superficial wound on proximal lateral limb with no signs of infection.    Education Provided Skin check;Residual limb care;Correct ply sock adjustment;Other (comment)   see prosthetic care comments   Person(s) Educated Patient    Education Method Explanation;Demonstration;Tactile cues;Verbal cues    Education Method Verbalized understanding;Tactile cues required;Verbal cues required;Needs further instruction               Balance Exercises - 06/01/20 1300      Balance Exercises: Standing   Standing Eyes Opened Wide (BOA);Foam/compliant surface;Head turns;5 reps   head turns right/left, up/down & diagonals,   Standing Eyes Opened Limitations standing crossways on foam beam inside //bars with intermittent touch & min guard / tactile cues.    Standing Eyes Closed Wide (BOA);Foam/compliant surface;5 reps;Head turns   minA for balance  reactions & intermittent touch //bars, head turns right/left, up/down & diagonals,   Standing Eyes Closed Limitations standing crossways on foam beam inside //bars with intermittent touch & minA / tactile cues.               PT Short Term Goals - 05/27/20 1628      PT SHORT TERM GOAL #1   Title Patient verbalizes proper sweat management with prostheses.    Time 1    Period Months    Status Revised    Target Date 06/26/20      PT SHORT TERM GOAL #2   Title Patient tolerates bilateral prostheses >90% awake hrs total /day without skin issues    Time 1    Period Months    Status Revised    Target Date 06/26/20      PT SHORT TERM GOAL #3   Title Patient able to reach 10" without UE support safely    Time 1    Period Months    Status Revised    Target Date 06/26/20      PT SHORT TERM GOAL #4   Title Patient ambulates 100' with cane & prostheses including sit / stand chair with armrests with supervision    Time 1    Period Months    Status Revised    Target Date 06/26/20      PT SHORT TERM GOAL #5   Title Patient negotiates ramps & curbs with cane & prostheses with minA.    Time 1    Period Months    Status New    Target Date 06/26/20             PT Long Term Goals - 04/29/20 1638      PT LONG TERM GOAL #1   Title  Patient demonstrates & verbalized understanding of prosthetic care to enable safe utilization of prostheses.    Time 12    Period Weeks    Status New    Target Date 07/23/20      PT LONG TERM GOAL #2   Title Patient tolerates prostheses wear for >90% of awake hours without skin issues    Time 12    Period Weeks    Status New    Target Date 07/23/20      PT LONG TERM GOAL #3   Title Berg Balance >/= 36/56 to indicate lower fall risk    Time 12    Period Weeks    Status New    Target Date 07/23/20      PT LONG TERM GOAL #4   Title Patient ambulates 300' with LRAD & prostheses modified independent    Time 12    Period Weeks    Status New     Target Date 07/23/20      PT LONG TERM GOAL #5   Title Patient negotiates ramps, curbs & stairs with LRAD & prostheses modified independent.    Time 12    Period Weeks    Status New    Target Date 07/23/20                 Plan - 06/01/20 1256    Clinical Impression Statement Patient developed superficial wound that he reports was a blister on Saturday.  PT thinks it may be related to not adjusting ply socks with limb volume changes including dialysis.  PT worked on balance reactions which improved.    Personal Factors and Comorbidities Comorbidity 3+;Fitness;Time since onset of injury/illness/exacerbation    Comorbidities Left TTA, automatic cardioverter/defibrillator, arthritis, CAD, cardiomyopathy, CHF, COPD, DM2, neuropathy, ESRD, CAD w/catherization, HTN, kidney stones, right TKA 2007    Examination-Activity Limitations Lift;Locomotion Level;Squat;Stairs;Stand;Transfers    Examination-Participation Restrictions Driving;Community Activity    Stability/Clinical Decision Making Evolving/Moderate complexity    Rehab Potential Good    PT Frequency 2x / week    PT Duration 12 weeks    PT Treatment/Interventions ADLs/Self Care Home Management;DME Instruction;Gait training;Stair training;Functional mobility training;Therapeutic activities;Therapeutic exercise;Balance training;Neuromuscular re-education;Patient/family education;Prosthetic Training;Vestibular    PT Next Visit Plan check wound, work towards updated STGs,  review prosthetic care, prosthetic gait with cane, therapeutic exercise to increase functional strength & endurance.    Consulted and Agree with Plan of Care Patient           Patient will benefit from skilled therapeutic intervention in order to improve the following deficits and impairments:  Abnormal gait,Decreased activity tolerance,Decreased balance,Decreased endurance,Decreased knowledge of use of DME,Decreased mobility,Decreased skin integrity,Decreased  strength,Increased edema,Impaired flexibility,Postural dysfunction,Prosthetic Dependency,Pain  Visit Diagnosis: Other abnormalities of gait and mobility  Unsteadiness on feet  Muscle weakness (generalized)     Problem List Patient Active Problem List   Diagnosis Date Noted  . Malnutrition of moderate degree 11/18/2019  . Below-knee amputation of left lower extremity ([place]) 11/16/2019  . Abscess of left foot 11/13/2019  . Dehiscence of amputation stump ([place])   . History of complete ray amputation of fifth toe of left foot ([place]) 10/09/2019  . Acute pulmonary edema ([facility])   . Osteomyelitis of fifth toe of left foot ([place])   . Febrile illness 08/17/2019  . SIRS (systemic inflammatory response syndrome) ([place]) 08/17/2019  . Thrombocytopenia ([place]) 08/17/2019  . Pressure injury of back, stage 2 ([place]) 08/17/2019  . Hypertensive emergency 07/10/2019  . Onychomycosis 08/16/2016  .  Osteopathy in diseases classified elsewhere, unspecified site 05/14/2016  . Status post unilateral below knee amputation, right ([place]) 04/26/2016  . Bilateral carotid bruits 03/30/2016  . Diabetes mellitus with complication ([place])   . Anemia due to chronic kidney disease   . ESRD on dialysis ([place]) 11/30/2015  . Acute respiratory failure with hypoxia ([place]) 11/29/2015  . Dependence on renal dialysis ([place]) 08/15/2015  . Presence of automatic (implantable) cardiac defibrillator 03/12/2015  . At high risk for falls 09/29/2014  . Peripheral vascular disease ([place]) 09/29/2014  . Osteoarthritis of both knees 01/07/2014  . Osteoarthritis of multiple joints 10/06/2013  . NICM (nonischemic cardiomyopathy) ([place]) 06/27/2013  . CAD (coronary artery disease) 03/13/2013  . Abnormal nuclear stress test 01/27/2013  . Chronic systolic congestive heart failure ([place]) 01/27/2013  . COPD (chronic obstructive pulmonary disease) ([place]) 12/27/2012  . Diabetic peripheral neuropathy ([place]) 02/10/2012  . Urethral stricture 10/06/2010  .  URINARY CALCULUS 09/28/2009  . Adjustment disorder with depressed mood 08/21/2009  . Poorly controlled type II diabetes mellitus with renal complication ([place]) 65/78/4696  . Hyperlipidemia 04/14/2008  . Essential hypertension 04/14/2008  . Allergic rhinitis 04/14/2008    Jamey Reas, PT, DPT 06/01/2020, 1:49 PM  [facility] [address] [place], [place], [address] Phone: [phone]   Fax:  [phone]  Name: JOHNERIC MCFADDEN MRN: [id number] Date of Birth: 1960-12-15

## 2020-06-01 NOTE — Telephone Encounter (Signed)
Spoke with the pt and informed him of the message below.

## 2020-06-02 DIAGNOSIS — D631 Anemia in chronic kidney disease: Secondary | ICD-10-CM | POA: Diagnosis not present

## 2020-06-02 DIAGNOSIS — D509 Iron deficiency anemia, unspecified: Secondary | ICD-10-CM | POA: Diagnosis not present

## 2020-06-02 DIAGNOSIS — E209 Hypoparathyroidism, unspecified: Secondary | ICD-10-CM | POA: Diagnosis not present

## 2020-06-02 DIAGNOSIS — N186 End stage renal disease: Secondary | ICD-10-CM | POA: Diagnosis not present

## 2020-06-02 DIAGNOSIS — N2581 Secondary hyperparathyroidism of renal origin: Secondary | ICD-10-CM | POA: Diagnosis not present

## 2020-06-02 DIAGNOSIS — Z992 Dependence on renal dialysis: Secondary | ICD-10-CM | POA: Diagnosis not present

## 2020-06-03 ENCOUNTER — Ambulatory Visit: Payer: Medicare Other | Admitting: Physician Assistant

## 2020-06-03 ENCOUNTER — Ambulatory Visit (INDEPENDENT_AMBULATORY_CARE_PROVIDER_SITE_OTHER): Payer: Medicare Other | Admitting: Physical Therapy

## 2020-06-03 ENCOUNTER — Encounter: Payer: Self-pay | Admitting: Physical Therapy

## 2020-06-03 ENCOUNTER — Other Ambulatory Visit: Payer: Self-pay

## 2020-06-03 DIAGNOSIS — R2689 Other abnormalities of gait and mobility: Secondary | ICD-10-CM | POA: Diagnosis not present

## 2020-06-03 DIAGNOSIS — R2681 Unsteadiness on feet: Secondary | ICD-10-CM | POA: Diagnosis not present

## 2020-06-03 DIAGNOSIS — M6281 Muscle weakness (generalized): Secondary | ICD-10-CM

## 2020-06-03 NOTE — Therapy (Signed)
[facility] Physical Therapy [address] [place], [place], [address] Phone: [phone]   Fax:  [phone]  Physical Therapy Treatment  Patient Details  Name: Anthony Bullock MRN: [id number] Date of Birth: 08-Oct-1960 Referring Provider (PT): Bevely Palmer Persons, [place]   Encounter Date: 06/03/2020   PT End of Session - 06/03/20 1426    Visit Number 8    Number of Visits 25    Date for PT Re-Evaluation 07/23/20    Authorization Type Medicare & BCBS supplement    PT Start Time 1430    PT Stop Time 1508    PT Time Calculation (min) 38 min    Equipment Utilized During Treatment Gait belt    Activity Tolerance Patient tolerated treatment well;Patient limited by fatigue    Behavior During Therapy [facility] for tasks assessed/performed           Past Medical History:  Diagnosis Date  . 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 Allergic rhinitis   . Anemia   . Arthritis   . Chronic systolic heart failure ([place])    a. ECHO (12/2012) EF 25-30%, HK entireanteroseptal myocardium //  b.  EF 25%, diffuse HK, grade 1 diastolic dysfunction, MAC, mild LAE, normal RVSF, trivial pericardial effusion  . COPD (chronic obstructive pulmonary disease) ([place])   . Diabetes mellitus type II   . Diabetic nephropathy ([place])   . Diabetic neuropathy ([place])   . ESRD on hemodialysis [facility])    started HD June 2017, goes to [facility] HD unit, Dr Hinda Lenis  . History of cardiac catheterization    a.Myoview 1/15:  There is significant left ventricular dysfunction. There may be slight scar at the apex. There is no significant ischemia. LV Ejection Fraction: 27%  //  b. RHC/LHC (1/15) with mean RA 6, PA 47/22 mean 33, mean PCWP 20, PVR 2.5 WU, CI 2.5; 80% dLAD stenosis, 70% diffuse large D.   . History of kidney stones   . Hyperlipidemia   . Hypertension   . Kidney stones   . NICM (nonischemic cardiomyopathy) ([place])    Primarily nonischemic. Echo (12/14) with EF  25-30%. Echo (3/15) with EF 25%, mild to moderately dilated LV, normal RV size and systolic function.   . Osteomyelitis ([place])    left fifth ray  . Pneumonia   . Urethral stricture   . Wears glasses     Past Surgical History:  Procedure Laterality Date  . ABDOMINAL AORTOGRAM W/LOWER EXTREMITY N/A 03/30/2016   Procedure: Abdominal Aortogram w/Lower Extremity;  Surgeon: Angelia Mould, MD;  Location: [place] [facility];  Service: Cardiovascular;  Laterality: N/A;  . AMPUTATION Right 04/26/2016   Procedure: Right Below Knee Amputation;  Surgeon: Newt Minion, MD;  Location: [place];  Service: Orthopedics;  Laterality: Right;  . AMPUTATION Left 08/21/2019   Procedure: LEFT FOOT 5TH RAY AMPUTATION;  Surgeon: Newt Minion, MD;  Location: [place];  Service: Orthopedics;  Laterality: Left;  . AMPUTATION Left 11/13/2019   Procedure: LEFT BELOW KNEE AMPUTATION;  Surgeon: Newt Minion, MD;  Location: [place];  Service: Orthopedics;  Laterality: Left;  . AV FISTULA PLACEMENT Right 09/08/2015   Procedure: INSERTION OF 4-73m x 45cm  ARTERIOVENOUS (AV) GORE-TEX GRAFT RIGHT UPPER  ARM;  Surgeon: CAngelia Mould MD;  [place]  Service: Vascular;  Laterality: Right;  . AV FISTULA PLACEMENT Left 01/14/2016   Procedure: CREATION OF LEFT UPPER ARM ARTERIOVENOUS FISTULA;  Surgeon: CJudeth Cornfield  Scot Dock, MD;  Location: [place];  Service: Vascular;  Laterality: Left;  . BASCILIC VEIN TRANSPOSITION Right 08/22/2014   Procedure: RIGHT UPPER ARM BASCILIC VEIN TRANSPOSITION;  Surgeon: Angelia Mould, MD;  Location: [place];  Service: Vascular;  Laterality: Right;  . BELOW KNEE LEG AMPUTATION Right 04/26/2016  . CARDIAC CATHETERIZATION    . CARDIAC DEFIBRILLATOR PLACEMENT  06/27/2013   Sub Q       BY DR Caryl Comes  . CATARACT EXTRACTION W/PHACO Right 08/06/2018   Procedure: CATARACT EXTRACTION PHACO AND INTRAOCULAR LENS PLACEMENT (IOC);  Surgeon: Baruch Goldmann, MD;  Location: [facility];  Service:  Ophthalmology;  Laterality: Right;  CDE: 4.06  . CATARACT EXTRACTION W/PHACO Left 08/20/2018   Procedure: CATARACT EXTRACTION PHACO AND INTRAOCULAR LENS PLACEMENT (IOC);  Surgeon: Baruch Goldmann, MD;  Location: [facility];  Service: Ophthalmology;  Laterality: Left;  CDE: 6.76  . COLONOSCOPY WITH PROPOFOL N/A 07/22/2015   Procedure: COLONOSCOPY WITH PROPOFOL;  Surgeon: Doran Stabler, MD;  Location: [facility];  Service: Gastroenterology;  Laterality: N/A;  . FEMORAL-POPLITEAL BYPASS GRAFT Right 03/31/2016   Procedure: BYPASS GRAFT FEMORAL-POPLITEAL ARTERY USING RIGHT GREATER SAPHENOUS NONREVERSED VEIN;  Surgeon: Angelia Mould, MD;  Location: [place];  Service: Vascular;  Laterality: Right;  . HERNIA REPAIR    . I & D EXTREMITY Right 03/31/2016   Procedure: IRRIGATION AND DEBRIDEMENT FOOT;  Surgeon: Angelia Mould, MD;  Location: [place];  Service: Vascular;  Laterality: Right;  . IMPLANTABLE CARDIOVERTER DEFIBRILLATOR IMPLANT N/A 06/27/2013   Procedure: SUB Q ICD;  Surgeon: Deboraha Sprang, MD;  Location: [facility] LAB;  Service: Cardiovascular;  Laterality: N/A;  . INTRAOPERATIVE ARTERIOGRAM Right 03/31/2016   Procedure: INTRA OPERATIVE ARTERIOGRAM;  Surgeon: Angelia Mould, MD;  Location: [place];  Service: Vascular;  Laterality: Right;  . IR GENERIC HISTORICAL Right 11/30/2015   IR THROMBECTOMY AV FISTULA W/THROMBOLYSIS/PTA INC/SHUNT/IMG RIGHT 11/30/2015 Aletta Edouard, MD [facility]  . IR GENERIC HISTORICAL  11/30/2015   IR US GUIDE VASC ACCESS RIGHT 11/30/2015 Aletta Edouard, MD [facility]  . IR GENERIC HISTORICAL Right 12/15/2015   IR THROMBECTOMY AV FISTULA W/THROMBOLYSIS/PTA/STENT INC/SHUNT/IMG RT 12/15/2015 Arne Cleveland, MD [facility]  . IR GENERIC HISTORICAL  12/15/2015   IR US GUIDE VASC ACCESS RIGHT 12/15/2015 Arne Cleveland, MD [facility]  . IR GENERIC HISTORICAL  12/28/2015   IR FLUORO GUIDE CV LINE RIGHT 12/28/2015 Marybelle Killings, MD [facility]  . IR GENERIC  HISTORICAL  12/28/2015   IR US GUIDE VASC ACCESS RIGHT 12/28/2015 Marybelle Killings, MD [facility]  . LEFT A ND RIGHT HEART CATH  01/30/2013   DR Sung Amabile  . LEFT AND RIGHT HEART CATHETERIZATION WITH CORONARY ANGIOGRAM N/A 01/30/2013   Procedure: LEFT AND RIGHT HEART CATHETERIZATION WITH CORONARY ANGIOGRAM;  Surgeon: Jolaine Artist, MD;  Location: [facility] CATH LAB;  Service: Cardiovascular;  Laterality: N/A;  . PERIPHERAL VASCULAR CATHETERIZATION Right 01/26/2015   Procedure: A/V Fistulagram;  Surgeon: Angelia Mould, MD;  Location: [place] [facility];  Service: Cardiovascular;  Laterality: Right;  . reapea urethral surgery for recurrent obstruction  2011  . TOTAL KNEE ARTHROPLASTY Right 2007  . VEIN HARVEST Right 03/31/2016   Procedure: RIGHT GREATER SAPHENOUS VEIN HARVEST;  Surgeon: Angelia Mould, MD;  Location: [place];  Service: Vascular;  Laterality: Right;    There were no vitals filed for this visit.   Subjective Assessment - 06/03/20 1427    Subjective The area on leg is  better.  He is wearing Vivewear shrinker under liner for wound.    Patient is accompained by: Family member   wife, Aking Klabunde   Pertinent History Left TTA, automatic cardioverter/defibrillator, arthritis, CAD, cardiomyopathy, CHF, COPD, DM2, neuropathy, ESRD, CAD w/catherization, HTN, kidney stones, right TKA 2007    Patient Stated Goals Walk in home & community.    Currently in Pain? No/denies                             [facility] Adult PT Treatment/Exercise - 06/03/20 1428      Transfers   Sit to Stand 5: Supervision;With upper extremity assist;With armrests;From chair/3-in-1;Other (comment)   to RW   Sit to Stand Details Visual cues for safe use of DME/AE;Verbal cues for sequencing;Verbal cues for technique    Stand to Sit 5: Supervision;With upper extremity assist;With armrests;To chair/3-in-1;Other (comment)   from RW   Stand to Sit Details (indicate cue type and reason) Visual cues  for safe use of DME/AE;Verbal cues for sequencing;Verbal cues for technique      Ambulation/Gait   Ambulation/Gait Yes    Ambulation/Gait Assistance 5: Supervision;4: Min assist   MinA cane stand alone tip & supervision RW   Ambulation/Gait Assistance Details tactile & verbal cues on upright posture, sequence & balance reactions.    Ambulation Distance (Feet) 100 Feet   100' X 2 with RW, 80' cane stand alone tip   Assistive device Rolling walker;Prostheses;Straight cane   cane stand alone tip   Ramp 2: Max assist   HHA & cane stand alone tip with bil TTA prostheses   Ramp Details (indicate cue type and reason) PT demo, verbal & tactile cues on technique with prostheses    Curb 3: Mod assist   HHA & cane stand alone tip with bil TTA prostheses   Curb Details (indicate cue type and reason) PT demo, verbal & tactile cues on technique with prostheses    Pre-Gait Activities in //bars assessed gait with cane in ea hand and was more stable with cane in LUE.      High Level Balance   High Level Balance Activities Side stepping;Backward walking   cane stand alone tip & bil TTA prostheses   High Level Balance Comments PT demo, verbal, mirror for visual & tactile cues on technique      Neuro Re-ed    Neuro Re-ed Details  --      Exercises   Exercises Other Exercises;Knee/Hip      Knee/Hip Exercises: Stretches   Active Hamstring Stretch Right;Left;2 reps;30 seconds    Active Hamstring Stretch Limitations supine with strap DF      Knee/Hip Exercises: Machines for Strengthening   Total Gym Leg Press shuttle leg press 112# 15 reps 1st set back 45* & only 6 reps 2nd set back flat  single leg stance on ea LE by lifting other LE off plate for ~2VOZ.      Knee/Hip Exercises: Standing   Terminal Knee Extension --    Theraband Level (Terminal Knee Extension) --    Terminal Knee Extension Limitations --    Forward Step Up Right;Left;1 set;Hand Hold: 2;Step Height: 6";5 reps    Forward Step Up  Limitations demo, tactile & verbal cues on technique    Step Down Right;Left;1 set;Hand Hold: 2;Step Height: 6";5 reps    Step Down Limitations demo, tactile & verbal cues on technique    Rocker Board 1 minute  round board w/1 pivot points, ant/level/post & right/level/left   Rocker Board Limitations verbal & tactile cues on technique      Knee/Hip Exercises: Seated   Sit to Sand --      Prosthetics   Prosthetic Care Comments  PT reviewed using Tegaderm over wound including proper application.  He has bandaid over wound which increases pressure to wound.    Current prosthetic wear tolerance (days/week)  daily    Current prosthetic wear tolerance (#hours/day)  most of awake hours    Edema pitting flucutating with dialysis    Residual limb condition  superficial wound on proximal lateral limb with no signs of infection.    Education Provided Skin check;Residual limb care;Correct ply sock adjustment;Other (comment)   see prosthetic care comments   Person(s) Educated Patient    Education Method Explanation;Demonstration;Tactile cues;Verbal cues    Education Method Verbalized understanding;Tactile cues required;Verbal cues required;Needs further instruction                    PT Short Term Goals - 05/27/20 1628      PT SHORT TERM GOAL #1   Title Patient verbalizes proper sweat management with prostheses.    Time 1    Period Months    Status Revised    Target Date 06/26/20      PT SHORT TERM GOAL #2   Title Patient tolerates bilateral prostheses >90% awake hrs total /day without skin issues    Time 1    Period Months    Status Revised    Target Date 06/26/20      PT SHORT TERM GOAL #3   Title Patient able to reach 10" without UE support safely    Time 1    Period Months    Status Revised    Target Date 06/26/20      PT SHORT TERM GOAL #4   Title Patient ambulates 100' with cane & prostheses including sit / stand chair with armrests with supervision    Time 1     Period Months    Status Revised    Target Date 06/26/20      PT SHORT TERM GOAL #5   Title Patient negotiates ramps & curbs with cane & prostheses with minA.    Time 1    Period Months    Status New    Target Date 06/26/20             PT Long Term Goals - 04/29/20 1638      PT LONG TERM GOAL #1   Title Patient demonstrates & verbalized understanding of prosthetic care to enable safe utilization of prostheses.    Time 12    Period Weeks    Status New    Target Date 07/23/20      PT LONG TERM GOAL #2   Title Patient tolerates prostheses wear for >90% of awake hours without skin issues    Time 12    Period Weeks    Status New    Target Date 07/23/20      PT LONG TERM GOAL #3   Title Berg Balance >/= 36/56 to indicate lower fall risk    Time 12    Period Weeks    Status New    Target Date 07/23/20      PT LONG TERM GOAL #4   Title Patient ambulates 300' with LRAD & prostheses modified independent    Time 12    Period Weeks  Status New    Target Date 07/23/20      PT LONG TERM GOAL #5   Title Patient negotiates ramps, curbs & stairs with LRAD & prostheses modified independent.    Time 12    Period Weeks    Status New    Target Date 07/23/20                 Plan - 06/03/20 1427    Clinical Impression Statement PT worked on prosthetic gait with cane. He improved on level surfaces with instruction.  He needs significant more work for ramps & curbs.  Patient needs repetitive instruction for adjusting ply socks.    Personal Factors and Comorbidities Comorbidity 3+;Fitness;Time since onset of injury/illness/exacerbation    Comorbidities Left TTA, automatic cardioverter/defibrillator, arthritis, CAD, cardiomyopathy, CHF, COPD, DM2, neuropathy, ESRD, CAD w/catherization, HTN, kidney stones, right TKA 2007    Examination-Activity Limitations Lift;Locomotion Level;Squat;Stairs;Stand;Transfers    Examination-Participation Restrictions Driving;Community Activity     Stability/Clinical Decision Making Evolving/Moderate complexity    Rehab Potential Good    PT Frequency 2x / week    PT Duration 12 weeks    PT Treatment/Interventions ADLs/Self Care Home Management;DME Instruction;Gait training;Stair training;Functional mobility training;Therapeutic activities;Therapeutic exercise;Balance training;Neuromuscular re-education;Patient/family education;Prosthetic Training;Vestibular    PT Next Visit Plan check wound, work towards updated STGs,  review prosthetic care, prosthetic gait with cane, therapeutic exercise to increase functional strength & endurance.    Consulted and Agree with Plan of Care Patient           Patient will benefit from skilled therapeutic intervention in order to improve the following deficits and impairments:  Abnormal gait,Decreased activity tolerance,Decreased balance,Decreased endurance,Decreased knowledge of use of DME,Decreased mobility,Decreased skin integrity,Decreased strength,Increased edema,Impaired flexibility,Postural dysfunction,Prosthetic Dependency,Pain  Visit Diagnosis: Other abnormalities of gait and mobility  Unsteadiness on feet  Muscle weakness (generalized)     Problem List Patient Active Problem List   Diagnosis Date Noted  . Malnutrition of moderate degree 11/18/2019  . Below-knee amputation of left lower extremity ([place]) 11/16/2019  . Abscess of left foot 11/13/2019  . Dehiscence of amputation stump ([place])   . History of complete ray amputation of fifth toe of left foot ([place]) 10/09/2019  . Acute pulmonary edema ([facility])   . Osteomyelitis of fifth toe of left foot ([place])   . Febrile illness 08/17/2019  . SIRS (systemic inflammatory response syndrome) ([place]) 08/17/2019  . Thrombocytopenia ([place]) 08/17/2019  . Pressure injury of back, stage 2 ([place]) 08/17/2019  . Hypertensive emergency 07/10/2019  . Onychomycosis 08/16/2016  . Osteopathy in diseases classified elsewhere, unspecified site 05/14/2016  .  Status post unilateral below knee amputation, right ([place]) 04/26/2016  . Bilateral carotid bruits 03/30/2016  . Diabetes mellitus with complication ([place])   . Anemia due to chronic kidney disease   . ESRD on dialysis ([place]) 11/30/2015  . Acute respiratory failure with hypoxia ([place]) 11/29/2015  . Dependence on renal dialysis ([place]) 08/15/2015  . Presence of automatic (implantable) cardiac defibrillator 03/12/2015  . At high risk for falls 09/29/2014  . Peripheral vascular disease ([place]) 09/29/2014  . Osteoarthritis of both knees 01/07/2014  . Osteoarthritis of multiple joints 10/06/2013  . NICM (nonischemic cardiomyopathy) ([place]) 06/27/2013  . CAD (coronary artery disease) 03/13/2013  . Abnormal nuclear stress test 01/27/2013  . Chronic systolic congestive heart failure ([place]) 01/27/2013  . COPD (chronic obstructive pulmonary disease) ([place]) 12/27/2012  . Diabetic peripheral neuropathy ([place]) 02/10/2012  . Urethral stricture 10/06/2010  . URINARY CALCULUS 09/28/2009  .  Adjustment disorder with depressed mood 08/21/2009  . Poorly controlled type II diabetes mellitus with renal complication ([place]) 30/09/2328  . Hyperlipidemia 04/14/2008  . Essential hypertension 04/14/2008  . Allergic rhinitis 04/14/2008    Jamey Reas PT, DPT 06/03/2020, 3:10 PM  [facility] Physical Therapy [address] [place], [place], [address] Phone: [phone]   Fax:  [phone]  Name: Anthony Bullock MRN: [id number] Date of Birth: 1960-10-24

## 2020-06-04 DIAGNOSIS — N2581 Secondary hyperparathyroidism of renal origin: Secondary | ICD-10-CM | POA: Diagnosis not present

## 2020-06-04 DIAGNOSIS — E209 Hypoparathyroidism, unspecified: Secondary | ICD-10-CM | POA: Diagnosis not present

## 2020-06-04 DIAGNOSIS — D509 Iron deficiency anemia, unspecified: Secondary | ICD-10-CM | POA: Diagnosis not present

## 2020-06-04 DIAGNOSIS — D631 Anemia in chronic kidney disease: Secondary | ICD-10-CM | POA: Diagnosis not present

## 2020-06-04 DIAGNOSIS — Z992 Dependence on renal dialysis: Secondary | ICD-10-CM | POA: Diagnosis not present

## 2020-06-04 DIAGNOSIS — N186 End stage renal disease: Secondary | ICD-10-CM | POA: Diagnosis not present

## 2020-06-05 ENCOUNTER — Telehealth: Payer: Self-pay | Admitting: Family Medicine

## 2020-06-05 MED ORDER — BASAGLAR KWIKPEN 100 UNIT/ML ~~LOC~~ SOPN: PEN_INJECTOR | SUBCUTANEOUS | 11 refills | Status: DC

## 2020-06-05 NOTE — Telephone Encounter (Signed)
Pt call and stated his pharmacy haven't receive the refill on [place] and humalog.

## 2020-06-05 NOTE — Telephone Encounter (Signed)
Spoke with the patient. He stated he got the Humalog but not basaglar.  The basaglar had been set at print and did not go to the pharmacy electronically. I have resent the prescription to the [facility] in [place] as requested by the patient. He is aware that his medication has been sent in. Nothing further needed.

## 2020-06-06 DIAGNOSIS — N2581 Secondary hyperparathyroidism of renal origin: Secondary | ICD-10-CM | POA: Diagnosis not present

## 2020-06-06 DIAGNOSIS — Z992 Dependence on renal dialysis: Secondary | ICD-10-CM | POA: Diagnosis not present

## 2020-06-06 DIAGNOSIS — D631 Anemia in chronic kidney disease: Secondary | ICD-10-CM | POA: Diagnosis not present

## 2020-06-06 DIAGNOSIS — D509 Iron deficiency anemia, unspecified: Secondary | ICD-10-CM | POA: Diagnosis not present

## 2020-06-06 DIAGNOSIS — N186 End stage renal disease: Secondary | ICD-10-CM | POA: Diagnosis not present

## 2020-06-06 DIAGNOSIS — E209 Hypoparathyroidism, unspecified: Secondary | ICD-10-CM | POA: Diagnosis not present

## 2020-06-09 DIAGNOSIS — Z992 Dependence on renal dialysis: Secondary | ICD-10-CM | POA: Diagnosis not present

## 2020-06-09 DIAGNOSIS — N186 End stage renal disease: Secondary | ICD-10-CM | POA: Diagnosis not present

## 2020-06-09 DIAGNOSIS — D509 Iron deficiency anemia, unspecified: Secondary | ICD-10-CM | POA: Diagnosis not present

## 2020-06-09 DIAGNOSIS — E209 Hypoparathyroidism, unspecified: Secondary | ICD-10-CM | POA: Diagnosis not present

## 2020-06-09 DIAGNOSIS — D631 Anemia in chronic kidney disease: Secondary | ICD-10-CM | POA: Diagnosis not present

## 2020-06-09 DIAGNOSIS — N2581 Secondary hyperparathyroidism of renal origin: Secondary | ICD-10-CM | POA: Diagnosis not present

## 2020-06-10 ENCOUNTER — Encounter: Payer: Self-pay | Admitting: Physical Therapy

## 2020-06-10 ENCOUNTER — Other Ambulatory Visit: Payer: Self-pay

## 2020-06-10 ENCOUNTER — Other Ambulatory Visit: Payer: Self-pay | Admitting: Family Medicine

## 2020-06-10 ENCOUNTER — Ambulatory Visit (INDEPENDENT_AMBULATORY_CARE_PROVIDER_SITE_OTHER): Payer: Medicare Other | Admitting: Physical Therapy

## 2020-06-10 DIAGNOSIS — M6281 Muscle weakness (generalized): Secondary | ICD-10-CM | POA: Diagnosis not present

## 2020-06-10 DIAGNOSIS — R2689 Other abnormalities of gait and mobility: Secondary | ICD-10-CM | POA: Diagnosis not present

## 2020-06-10 DIAGNOSIS — R2681 Unsteadiness on feet: Secondary | ICD-10-CM

## 2020-06-10 NOTE — Therapy (Signed)
[facility] Physical Therapy [address] [place], [place], [address] Phone: [phone]   Fax:  [phone]  Physical Therapy Treatment  Patient Details  Name: Anthony Bullock MRN: [id number] Date of Birth: [birth year] Referring Provider (PT): Bevely Palmer Persons, [place]   Encounter Date: 06/10/2020   PT End of Session - 06/10/20 1339    Visit Number 9    Number of Visits 25    Date for PT Re-Evaluation 07/23/20    Authorization Type Medicare & BCBS supplement    PT Start Time 1343    PT Stop Time 1425    PT Time Calculation (min) 42 min    Equipment Utilized During Treatment Gait belt    Activity Tolerance Patient tolerated treatment well;Patient limited by fatigue    Behavior During Therapy [facility] for tasks assessed/performed           Past Medical History:  Diagnosis Date  . 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 Allergic rhinitis   . Anemia   . Arthritis   . Chronic systolic heart failure ([place])    a. ECHO (12/2012) EF 25-30%, HK entireanteroseptal myocardium //  b.  EF 25%, diffuse HK, grade 1 diastolic dysfunction, MAC, mild LAE, normal RVSF, trivial pericardial effusion  . COPD (chronic obstructive pulmonary disease) ([place])   . Diabetes mellitus type II   . Diabetic nephropathy ([place])   . Diabetic neuropathy ([place])   . ESRD on hemodialysis [facility])    started HD June 2017, goes to [facility] HD unit, Dr Hinda Lenis  . History of cardiac catheterization    a.Myoview 1/15:  There is significant left ventricular dysfunction. There may be slight scar at the apex. There is no significant ischemia. LV Ejection Fraction: 27%  //  b. RHC/LHC (1/15) with mean RA 6, PA 47/22 mean 33, mean PCWP 20, PVR 2.5 WU, CI 2.5; 80% dLAD stenosis, 70% diffuse large D.   . History of kidney stones   . Hyperlipidemia   . Hypertension   . Kidney stones   . NICM (nonischemic cardiomyopathy) ([place])    Primarily nonischemic. Echo (12/14) with EF  25-30%. Echo (3/15) with EF 25%, mild to moderately dilated LV, normal RV size and systolic function.   . Osteomyelitis ([place])    left fifth ray  . Pneumonia   . Urethral stricture   . Wears glasses     Past Surgical History:  Procedure Laterality Date  . ABDOMINAL AORTOGRAM W/LOWER EXTREMITY N/A 03/30/2016   Procedure: Abdominal Aortogram w/Lower Extremity;  Surgeon: Angelia Mould, MD;  Location: [place] [facility];  Service: Cardiovascular;  Laterality: N/A;  . AMPUTATION Right 04/26/2016   Procedure: Right Below Knee Amputation;  Surgeon: Newt Minion, MD;  Location: [place];  Service: Orthopedics;  Laterality: Right;  . AMPUTATION Left 08/21/2019   Procedure: LEFT FOOT 5TH RAY AMPUTATION;  Surgeon: Newt Minion, MD;  Location: [place];  Service: Orthopedics;  Laterality: Left;  . AMPUTATION Left 11/13/2019   Procedure: LEFT BELOW KNEE AMPUTATION;  Surgeon: Newt Minion, MD;  Location: [place];  Service: Orthopedics;  Laterality: Left;  . AV FISTULA PLACEMENT Right 09/08/2015   Procedure: INSERTION OF 4-5m x 45cm  ARTERIOVENOUS (AV) GORE-TEX GRAFT RIGHT UPPER  ARM;  Surgeon: CAngelia Mould MD;  [place]  Service: Vascular;  Laterality: Right;  . AV FISTULA PLACEMENT Left 01/14/2016   Procedure: CREATION OF LEFT UPPER ARM ARTERIOVENOUS FISTULA;  Surgeon: CJudeth Cornfield  Scot Dock, MD;  Location: [place];  Service: Vascular;  Laterality: Left;  . BASCILIC VEIN TRANSPOSITION Right 08/22/2014   Procedure: RIGHT UPPER ARM BASCILIC VEIN TRANSPOSITION;  Surgeon: Angelia Mould, MD;  Location: [place];  Service: Vascular;  Laterality: Right;  . BELOW KNEE LEG AMPUTATION Right 04/26/2016  . CARDIAC CATHETERIZATION    . CARDIAC DEFIBRILLATOR PLACEMENT  06/27/2013   Sub Q       BY DR Caryl Comes  . CATARACT EXTRACTION W/PHACO Right 08/06/2018   Procedure: CATARACT EXTRACTION PHACO AND INTRAOCULAR LENS PLACEMENT (IOC);  Surgeon: Baruch Goldmann, MD;  Location: [facility];  Service:  Ophthalmology;  Laterality: Right;  CDE: 4.06  . CATARACT EXTRACTION W/PHACO Left 08/20/2018   Procedure: CATARACT EXTRACTION PHACO AND INTRAOCULAR LENS PLACEMENT (IOC);  Surgeon: Baruch Goldmann, MD;  Location: [facility];  Service: Ophthalmology;  Laterality: Left;  CDE: 6.76  . COLONOSCOPY WITH PROPOFOL N/A 07/22/2015   Procedure: COLONOSCOPY WITH PROPOFOL;  Surgeon: Doran Stabler, MD;  [facility]: [facility];  Service: Gastroenterology;  Laterality: N/A;  . FEMORAL-POPLITEAL BYPASS GRAFT Right 03/31/2016   Procedure: BYPASS GRAFT FEMORAL-POPLITEAL ARTERY USING RIGHT GREATER SAPHENOUS NONREVERSED VEIN;  Surgeon: Angelia Mould, MD;  Location: [place];  Service: Vascular;  Laterality: Right;  . HERNIA REPAIR    . I & D EXTREMITY Right 03/31/2016   Procedure: IRRIGATION AND DEBRIDEMENT FOOT;  Surgeon: Angelia Mould, MD;  Location: [place];  Service: Vascular;  Laterality: Right;  . IMPLANTABLE CARDIOVERTER DEFIBRILLATOR IMPLANT N/A 06/27/2013   Procedure: SUB Q ICD;  Surgeon: Deboraha Sprang, MD;  Location: [facility] C[facility];  Service: Cardiovascular;  Laterality: N/A;  . INTRAOPERATIVE ARTERIOGRAM Right 03/31/2016   Procedure: INTRA OPERATIVE ARTERIOGRAM;  Surgeon: Angelia Mould, MD;  Location: [place];  Service: Vascular;  Laterality: Right;  . IR GENERIC HISTORICAL Right 11/30/2015   IR THROMBECTOMY AV FISTULA W/THROMBOLYSIS/PTA INC/SHUNT/IMG RIGHT 11/30/2015 Aletta Edouard, MD [facility]  . IR GENERIC HISTORICAL  11/30/2015   IR US GUIDE VASC ACCESS RIGHT 11/30/2015 Aletta Edouard, MD [facility]  . IR GENERIC HISTORICAL Right 12/15/2015   IR THROMBECTOMY AV FISTULA W/THROMBOLYSIS/PTA/STENT INC/SHUNT/IMG RT 12/15/2015 Arne Cleveland, MD [facility]  . IR GENERIC HISTORICAL  12/15/2015   IR US GUIDE VASC ACCESS RIGHT 12/15/2015 Arne Cleveland, MD [facility]  . IR GENERIC HISTORICAL  12/28/2015   IR FLUORO GUIDE CV LINE RIGHT 12/28/2015 Marybelle Killings, MD [facility]  . IR GENERIC  HISTORICAL  12/28/2015   IR US GUIDE VASC ACCESS RIGHT 12/28/2015 Marybelle Killings, MD [facility]  . LEFT A ND RIGHT HEART CATH  01/30/2013   DR Sung Amabile  . LEFT AND RIGHT HEART CATHETERIZATION WITH CORONARY ANGIOGRAM N/A 01/30/2013   Procedure: LEFT AND RIGHT HEART CATHETERIZATION WITH CORONARY ANGIOGRAM;  Surgeon: Jolaine Artist, MD;  Location: [facility] CATH LAB;  Service: Cardiovascular;  Laterality: N/A;  . PERIPHERAL VASCULAR CATHETERIZATION Right 01/26/2015   Procedure: A/V Fistulagram;  Surgeon: Angelia Mould, MD;  Location: [place] [facility];  Service: Cardiovascular;  Laterality: Right;  . reapea urethral surgery for recurrent obstruction  2011  . TOTAL KNEE ARTHROPLASTY Right 2007  . VEIN HARVEST Right 03/31/2016   Procedure: RIGHT GREATER SAPHENOUS VEIN HARVEST;  Surgeon: Angelia Mould, MD;  Location: [place];  Service: Vascular;  Laterality: Right;    There were no vitals filed for this visit.   Subjective Assessment - 06/10/20 1340    Subjective The wound is looking better.  He wearing prostheses most of awake hours except 1-2 hours. He cancelled MD appt to get Rx for new right socket. He wants to wait until he is functioning with left prosthesis prior to redoing right prosthesis.    Patient is accompained by: Family member   wife, Arby Dahir   Pertinent History Left TTA, automatic cardioverter/defibrillator, arthritis, CAD, cardiomyopathy, CHF, COPD, DM2, neuropathy, ESRD, CAD w/catherization, HTN, kidney stones, right TKA 2007    Patient Stated Goals Walk in home & community.    Currently in Pain? No/denies                             [facility] Adult PT Treatment/Exercise - 06/10/20 1341      Transfers   Sit to Stand 5: Supervision;With upper extremity assist;With armrests;From chair/3-in-1;Other (comment)   to RW   Sit to Stand Details Visual cues for safe use of DME/AE;Verbal cues for sequencing;Verbal cues for technique    Stand to Sit 5:  Supervision;With upper extremity assist;With armrests;To chair/3-in-1;Other (comment)   from RW   Stand to Sit Details (indicate cue type and reason) Visual cues for safe use of DME/AE;Verbal cues for sequencing;Verbal cues for technique      Ambulation/Gait   Ambulation/Gait Yes    Ambulation/Gait Assistance 5: Supervision;4: Min guard   Min guard cane stand alone tip & supervision RW   Ambulation/Gait Assistance Details tactile & verbal cues on upright posture, sequence & balance reactions.    Ambulation Distance (Feet) 100 Feet   100' X 2 with RW, 80' cane stand alone tip   Assistive device Rolling walker;Prostheses;Straight cane   cane stand alone tip   Ambulation Surface Level;Indoor    Ramp 3: Mod assist   1st time with RW with cues on feeling wt shift over prostheses in stance & progressed to cane stand alone tip with bil TTA prostheses   Ramp Details (indicate cue type and reason) PT demo, verbal & tactile cues on technique with prostheses    Curb 3: Mod assist   1st time with RW with cues on feeling wt shift over prostheses in stance & progressed to cane stand alone tip with bil TTA prostheses   Curb Details (indicate cue type and reason) PT demo, verbal & tactile cues on technique with prostheses      High Level Balance   High Level Balance Activities Side stepping;Backward walking;Turns   90* turns, cane stand alone tip & bil TTA prostheses   High Level Balance Comments PT demo, verbal, mirror for visual & tactile cues on technique      Exercises   Exercises Other Exercises;Knee/Hip      Knee/Hip Exercises: Stretches   Active Hamstring Stretch Right;Left;2 reps;30 seconds    Active Hamstring Stretch Limitations supine with strap DF      Knee/Hip Exercises: Machines for Strengthening   Total Gym Leg Press shuttle leg press 118# 15 reps 1st set back 45* & 2nd set back flat  single leg stance on ea LE by lifting other LE off plate for ~6YIR.      Knee/Hip Exercises: Standing    Forward Step Up Right;Left;1 set;Hand Hold: 2;Step Height: 6";5 reps    Forward Step Up Limitations demo, tactile & verbal cues on technique    Step Down Right;Left;1 set;Hand Hold: 2;Step Height: 6";5 reps    Step Down Limitations demo, tactile & verbal cues on technique    Rocker Board 1 minute  round board w/1 pivot points, ant/level/post & right/level/left   Rocker Board Limitations verbal & tactile cues on technique      Knee/Hip Exercises: Seated   Sit to Sand 1 set;10 reps;without UE support   from 24" bar stool with goal to not touch //bars to stabilize     Prosthetics   Prosthetic Care Comments  --    Current prosthetic wear tolerance (days/week)  daily    Current prosthetic wear tolerance (#hours/day)  most of awake hours    Edema pitting flucutating with dialysis    Residual limb condition  wound healed but thin white epitheal covering so continue Tegaderm for at least 1 week for protection.    Education Provided Skin check;Residual limb care;Correct ply sock adjustment;Other (comment)   see prosthetic care comments   Person(s) Educated Patient    Education Method Explanation;Verbal cues    Education Method Verbalized understanding;Tactile cues required;Needs further instruction               Balance Exercises - 06/10/20 1345      Balance Exercises: Standing   Standing Eyes Opened Wide (BOA);Foam/compliant surface;Head turns;5 reps   head turns right/left, up/down & diagonals,   Standing Eyes Opened Limitations standing crossways on foam beam inside //bars with intermittent touch & min guard / tactile cues.    Standing Eyes Closed Wide (BOA);Foam/compliant surface;5 reps;Head turns   minA for balance reactions & intermittent touch //bars, head turns right/left, up/down & diagonals,   Standing Eyes Closed Limitations standing crossways on foam beam inside //bars with intermittent touch & minA / tactile cues.               PT Short Term Goals - 05/27/20 1628       PT SHORT TERM GOAL #1   Title Patient verbalizes proper sweat management with prostheses.    Time 1    Period Months    Status Revised    Target Date 06/26/20      PT SHORT TERM GOAL #2   Title Patient tolerates bilateral prostheses >90% awake hrs total /day without skin issues    Time 1    Period Months    Status Revised    Target Date 06/26/20      PT SHORT TERM GOAL #3   Title Patient able to reach 10" without UE support safely    Time 1    Period Months    Status Revised    Target Date 06/26/20      PT SHORT TERM GOAL #4   Title Patient ambulates 100' with cane & prostheses including sit / stand chair with armrests with supervision    Time 1    Period Months    Status Revised    Target Date 06/26/20      PT SHORT TERM GOAL #5   Title Patient negotiates ramps & curbs with cane & prostheses with minA.    Time 1    Period Months    Status New    Target Date 06/26/20             PT Long Term Goals - 04/29/20 1638      PT LONG TERM GOAL #1   Title Patient demonstrates & verbalized understanding of prosthetic care to enable safe utilization of prostheses.    Time 12    Period Weeks    Status New    Target Date 07/23/20      PT LONG TERM GOAL #2  Title Patient tolerates prostheses wear for >90% of awake hours without skin issues    Time 12    Period Weeks    Status New    Target Date 07/23/20      PT LONG TERM GOAL #3   Title Berg Balance >/= 36/56 to indicate lower fall risk    Time 12    Period Weeks    Status New    Target Date 07/23/20      PT LONG TERM GOAL #4   Title Patient ambulates 300' with LRAD & prostheses modified independent    Time 12    Period Weeks    Status New    Target Date 07/23/20      PT LONG TERM GOAL #5   Title Patient negotiates ramps, curbs & stairs with LRAD & prostheses modified independent.    Time 12    Period Weeks    Status New    Target Date 07/23/20                 Plan - 06/10/20 1340     Clinical Impression Statement Patient's wound has healed but would recommend using Tegaderm for another week until skin is not as tender.  PT worked on balance activities with cane within //bars with intermittent touch & his balance is improving.  He needs significant more work for using cane on ramps & curbs.    Personal Factors and Comorbidities Comorbidity 3+;Fitness;Time since onset of injury/illness/exacerbation    Comorbidities Left TTA, automatic cardioverter/defibrillator, arthritis, CAD, cardiomyopathy, CHF, COPD, DM2, neuropathy, ESRD, CAD w/catherization, HTN, kidney stones, right TKA 2007    Examination-Activity Limitations Lift;Locomotion Level;Squat;Stairs;Stand;Transfers    Examination-Participation Restrictions Driving;Community Activity    Stability/Clinical Decision Making Evolving/Moderate complexity    Rehab Potential Good    PT Frequency 2x / week    PT Duration 12 weeks    PT Treatment/Interventions ADLs/Self Care Home Management;DME Instruction;Gait training;Stair training;Functional mobility training;Therapeutic activities;Therapeutic exercise;Balance training;Neuromuscular re-education;Patient/family education;Prosthetic Training;Vestibular    PT Next Visit Plan Do 10th visit note, check wound, work towards updated STGs,  review prosthetic care, prosthetic gait with cane, therapeutic exercise to increase functional strength & endurance.    Consulted and Agree with Plan of Care Patient           Patient will benefit from skilled therapeutic intervention in order to improve the following deficits and impairments:  Abnormal gait,Decreased activity tolerance,Decreased balance,Decreased endurance,Decreased knowledge of use of DME,Decreased mobility,Decreased skin integrity,Decreased strength,Increased edema,Impaired flexibility,Postural dysfunction,Prosthetic Dependency,Pain  Visit Diagnosis: Unsteadiness on feet  Other abnormalities of gait and mobility  Muscle weakness  (generalized)     Problem List Patient Active Problem List   Diagnosis Date Noted  . Malnutrition of moderate degree 11/18/2019  . Below-knee amputation of left lower extremity ([place]) 11/16/2019  . Abscess of left foot 11/13/2019  . Dehiscence of amputation stump ([place])   . History of complete ray amputation of fifth toe of left foot ([place]) 10/09/2019  . Acute pulmonary edema ([facility])   . Osteomyelitis of fifth toe of left foot ([place])   . Febrile illness 08/17/2019  . SIRS (systemic inflammatory response syndrome) ([place]) 08/17/2019  . Thrombocytopenia ([place]) 08/17/2019  . Pressure injury of back, stage 2 ([place]) 08/17/2019  . Hypertensive emergency 07/10/2019  . Onychomycosis 08/16/2016  . Osteopathy in diseases classified elsewhere, unspecified site 05/14/2016  . Status post unilateral below knee amputation, right ([place]) 04/26/2016  . Bilateral carotid bruits 03/30/2016  . Diabetes mellitus with  complication ([place])   . Anemia due to chronic kidney disease   . ESRD on dialysis ([place]) 11/30/2015  . Acute respiratory failure with hypoxia ([place]) 11/29/2015  . Dependence on renal dialysis ([place]) 08/15/2015  . Presence of automatic (implantable) cardiac defibrillator 03/12/2015  . At high risk for falls 09/29/2014  . Peripheral vascular disease ([place]) 09/29/2014  . Osteoarthritis of both knees 01/07/2014  . Osteoarthritis of multiple joints 10/06/2013  . NICM (nonischemic cardiomyopathy) ([place]) 06/27/2013  . CAD (coronary artery disease) 03/13/2013  . Abnormal nuclear stress test 01/27/2013  . Chronic systolic congestive heart failure ([place]) 01/27/2013  . COPD (chronic obstructive pulmonary disease) ([place]) 12/27/2012  . Diabetic peripheral neuropathy ([place]) 02/10/2012  . Urethral stricture 10/06/2010  . URINARY CALCULUS 09/28/2009  . Adjustment disorder with depressed mood 08/21/2009  . Poorly controlled type II diabetes mellitus with renal complication ([place]) 67/20/9470  . Hyperlipidemia 04/14/2008   . Essential hypertension 04/14/2008  . Allergic rhinitis 04/14/2008    Jamey Reas, PT, DPT 06/10/2020, 4:27 PM  [facility] Physical Therapy [address] [place], [place], [address] Phone: [phone]   Fax:  [phone]  Name: Anthony Bullock MRN: [id number] Date of Birth: [birth year]

## 2020-06-11 DIAGNOSIS — E209 Hypoparathyroidism, unspecified: Secondary | ICD-10-CM | POA: Diagnosis not present

## 2020-06-11 DIAGNOSIS — N2581 Secondary hyperparathyroidism of renal origin: Secondary | ICD-10-CM | POA: Diagnosis not present

## 2020-06-11 DIAGNOSIS — D509 Iron deficiency anemia, unspecified: Secondary | ICD-10-CM | POA: Diagnosis not present

## 2020-06-11 DIAGNOSIS — D631 Anemia in chronic kidney disease: Secondary | ICD-10-CM | POA: Diagnosis not present

## 2020-06-11 DIAGNOSIS — Z992 Dependence on renal dialysis: Secondary | ICD-10-CM | POA: Diagnosis not present

## 2020-06-11 DIAGNOSIS — N186 End stage renal disease: Secondary | ICD-10-CM | POA: Diagnosis not present

## 2020-06-12 ENCOUNTER — Ambulatory Visit (INDEPENDENT_AMBULATORY_CARE_PROVIDER_SITE_OTHER): Payer: Medicare Other | Admitting: Physical Therapy

## 2020-06-12 ENCOUNTER — Other Ambulatory Visit: Payer: Self-pay

## 2020-06-12 DIAGNOSIS — R2681 Unsteadiness on feet: Secondary | ICD-10-CM | POA: Diagnosis not present

## 2020-06-12 DIAGNOSIS — M6281 Muscle weakness (generalized): Secondary | ICD-10-CM | POA: Diagnosis not present

## 2020-06-12 DIAGNOSIS — R2689 Other abnormalities of gait and mobility: Secondary | ICD-10-CM

## 2020-06-12 NOTE — Telephone Encounter (Signed)
Rx sent in. Patient has been made aware. Nothing further needed.

## 2020-06-12 NOTE — Telephone Encounter (Signed)
Pt is calling in stating that he needs a PA on his insulin glargine (BASAGLAR KWIKPEN) 100 units.  Pt would like to have a call back.

## 2020-06-12 NOTE — Therapy (Signed)
[facility] [address] [place], [place], [address] Phone: [phone]   Fax:  [phone]  Physical Therapy Treatment/Progress note Progress Note reporting period date 04/29/20 to 06/12/20  See below for objective and subjective measurements relating to patients progress with PT.   Patient Details  Name: Anthony Bullock MRN: [id number] Date of Birth: 1960/02/11 Referring Provider (PT): Bevely Palmer Persons, [place]   Encounter Date: 06/12/2020   PT End of Session - 06/12/20 1034    Visit Number 10    Number of Visits 25    Date for PT Re-Evaluation 07/23/20    Authorization Type Medicare & BCBS supplement    PT Start Time 1016    PT Stop Time 1100    PT Time Calculation (min) 44 min    Equipment Utilized During Treatment Gait belt    Activity Tolerance Patient tolerated treatment well;Patient limited by fatigue    Behavior During Therapy WFL for tasks assessed/performed           Past Medical History:  Diagnosis Date  . 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 Allergic rhinitis   . Anemia   . Arthritis   . Chronic systolic heart failure ([place])    a. ECHO (12/2012) EF 25-30%, HK entireanteroseptal myocardium //  b.  EF 25%, diffuse HK, grade 1 diastolic dysfunction, MAC, mild LAE, normal RVSF, trivial pericardial effusion  . COPD (chronic obstructive pulmonary disease) ([place])   . Diabetes mellitus type II   . Diabetic nephropathy ([place])   . Diabetic neuropathy ([place])   . ESRD on hemodialysis [facility])    started HD June 2017, goes to [facility] HD unit, Dr Hinda Lenis  . History of cardiac catheterization    a.Myoview 1/15:  There is significant left ventricular dysfunction. There may be slight scar at the apex. There is no significant ischemia. LV Ejection Fraction: 27%  //  b. RHC/LHC (1/15) with mean RA 6, PA 47/22 mean 33, mean PCWP 20, PVR 2.5 WU, CI 2.5; 80% dLAD stenosis, 70% diffuse large D.   . History of  kidney stones   . Hyperlipidemia   . Hypertension   . Kidney stones   . NICM (nonischemic cardiomyopathy) ([place])    Primarily nonischemic. Echo (12/14) with EF 25-30%. Echo (3/15) with EF 25%, mild to moderately dilated LV, normal RV size and systolic function.   . Osteomyelitis ([place])    left fifth ray  . Pneumonia   . Urethral stricture   . Wears glasses     Past Surgical History:  Procedure Laterality Date  . ABDOMINAL AORTOGRAM W/LOWER EXTREMITY N/A 03/30/2016   Procedure: Abdominal Aortogram w/Lower Extremity;  Surgeon: Angelia Mould, MD;  Location: [place] [facility];  Service: Cardiovascular;  Laterality: N/A;  . AMPUTATION Right 04/26/2016   Procedure: Right Below Knee Amputation;  Surgeon: Newt Minion, MD;  Location: [place];  Service: Orthopedics;  Laterality: Right;  . AMPUTATION Left 08/21/2019   Procedure: LEFT FOOT 5TH RAY AMPUTATION;  Surgeon: Newt Minion, MD;  Location: [place];  Service: Orthopedics;  Laterality: Left;  . AMPUTATION Left 11/13/2019   Procedure: LEFT BELOW KNEE AMPUTATION;  Surgeon: Newt Minion, MD;  Location: [place];  Service: Orthopedics;  Laterality: Left;  . AV FISTULA PLACEMENT Right 09/08/2015   Procedure: INSERTION OF 4-41m x 45cm  ARTERIOVENOUS (AV) GORE-TEX GRAFT RIGHT UPPER  ARM;  Surgeon: CAngelia Mould MD;  Location: [place]  Service: Vascular;  Laterality: Right;  . AV FISTULA PLACEMENT Left 01/14/2016   Procedure: CREATION OF LEFT UPPER ARM ARTERIOVENOUS FISTULA;  Surgeon: Angelia Mould, MD;  Location: [place];  Service: Vascular;  Laterality: Left;  . BASCILIC VEIN TRANSPOSITION Right 08/22/2014   Procedure: RIGHT UPPER ARM BASCILIC VEIN TRANSPOSITION;  Surgeon: Angelia Mould, MD;  Location: [place];  Service: Vascular;  Laterality: Right;  . BELOW KNEE LEG AMPUTATION Right 04/26/2016  . CARDIAC CATHETERIZATION    . CARDIAC DEFIBRILLATOR PLACEMENT  06/27/2013   Sub Q       BY DR Caryl Comes  . CATARACT EXTRACTION W/PHACO  Right 08/06/2018   Procedure: CATARACT EXTRACTION PHACO AND INTRAOCULAR LENS PLACEMENT (IOC);  Surgeon: Baruch Goldmann, MD;  Location: [facility];  Service: Ophthalmology;  Laterality: Right;  CDE: 4.06  . CATARACT EXTRACTION W/PHACO Left 08/20/2018   Procedure: CATARACT EXTRACTION PHACO AND INTRAOCULAR LENS PLACEMENT (IOC);  Surgeon: Baruch Goldmann, MD;  Location: [facility];  Service: Ophthalmology;  Laterality: Left;  CDE: 6.76  . COLONOSCOPY WITH PROPOFOL N/A 07/22/2015   Procedure: COLONOSCOPY WITH PROPOFOL;  Surgeon: Doran Stabler, MD;  Location: [facility];  Service: Gastroenterology;  Laterality: N/A;  . FEMORAL-POPLITEAL BYPASS GRAFT Right 03/31/2016   Procedure: BYPASS GRAFT FEMORAL-POPLITEAL ARTERY USING RIGHT GREATER SAPHENOUS NONREVERSED VEIN;  Surgeon: Angelia Mould, MD;  Location: [place];  Service: Vascular;  Laterality: Right;  . HERNIA REPAIR    . I & D EXTREMITY Right 03/31/2016   Procedure: IRRIGATION AND DEBRIDEMENT FOOT;  Surgeon: Angelia Mould, MD;  Location: [place];  Service: Vascular;  Laterality: Right;  . IMPLANTABLE CARDIOVERTER DEFIBRILLATOR IMPLANT N/A 06/27/2013   Procedure: SUB Q ICD;  Surgeon: Deboraha Sprang, MD;  Location: [facility] C[facility] LAB;  Service: Cardiovascular;  Laterality: N/A;  . INTRAOPERATIVE ARTERIOGRAM Right 03/31/2016   Procedure: INTRA OPERATIVE ARTERIOGRAM;  Surgeon: Angelia Mould, MD;  Location: [place];  Service: Vascular;  Laterality: Right;  . IR GENERIC HISTORICAL Right 11/30/2015   IR THROMBECTOMY AV FISTULA W/THROMBOLYSIS/PTA INC/SHUNT/IMG RIGHT 11/30/2015 Aletta Edouard, MD [facility]  . IR GENERIC HISTORICAL  11/30/2015   IR US GUIDE VASC ACCESS RIGHT 11/30/2015 Aletta Edouard, MD [facility]  . IR GENERIC HISTORICAL Right 12/15/2015   IR THROMBECTOMY AV FISTULA W/THROMBOLYSIS/PTA/STENT INC/SHUNT/IMG RT 12/15/2015 Arne Cleveland, MD [facility]  . IR GENERIC HISTORICAL  12/15/2015   IR US GUIDE VASC ACCESS RIGHT 12/15/2015 Arne Cleveland, MD [facility]  . IR GENERIC HISTORICAL  12/28/2015   IR FLUORO GUIDE CV LINE RIGHT 12/28/2015 Marybelle Killings, MD [facility]  . IR GENERIC HISTORICAL  12/28/2015   IR US GUIDE VASC ACCESS RIGHT 12/28/2015 Marybelle Killings, MD [facility]  . LEFT A ND RIGHT HEART CATH  01/30/2013   DR Sung Amabile  . LEFT AND RIGHT HEART CATHETERIZATION WITH CORONARY ANGIOGRAM N/A 01/30/2013   Procedure: LEFT AND RIGHT HEART CATHETERIZATION WITH CORONARY ANGIOGRAM;  Surgeon: Jolaine Artist, MD;  Location: [facility] CATH LAB;  Service: Cardiovascular;  Laterality: N/A;  . PERIPHERAL VASCULAR CATHETERIZATION Right 01/26/2015   Procedure: A/V Fistulagram;  Surgeon: Angelia Mould, MD;  Location: [place] [facility];  Service: Cardiovascular;  Laterality: Right;  . reapea urethral surgery for recurrent obstruction  2011  . TOTAL KNEE ARTHROPLASTY Right 2007  . VEIN HARVEST Right 03/31/2016   Procedure: RIGHT GREATER SAPHENOUS VEIN HARVEST;  Surgeon: Angelia Mould, MD;  Location: [place];  Service: Vascular;  Laterality: Right;    There  were no vitals filed for this visit.   Subjective Assessment - 06/12/20 1021    Subjective The wound is looking better. He denies pain today. He feels he has progressed 55% overall but still wants to get off the RW but relays difficulty with balance and SPC if he does not have it.    Patient is accompained by: Family member   wife, Enoc Getter   Pertinent History Left TTA, automatic cardioverter/defibrillator, arthritis, CAD, cardiomyopathy, CHF, COPD, DM2, neuropathy, ESRD, CAD w/catherization, HTN, kidney stones, right TKA 2007    Patient Stated Goals Walk in home & community.            [place] Adult PT Treatment/Exercise - 06/12/20 0001      Transfers   Sit to Stand 5: Supervision;With upper extremity assist;With armrests;From chair/3-in-1;Other (comment)    Stand to Sit 5: Supervision;With upper extremity assist;With armrests;To chair/3-in-1;Other (comment)       Ambulation/Gait   Ambulation/Gait Yes    Ambulation/Gait Assistance 5: Supervision    Ambulation Distance (Feet) 150 Feet    Ambulation Surface Level;Indoor    Ramp 3: Mod assist;4: Min assist   mod ascending, min descending ramp,   Ramp Details (indicate cue type and reason) PT demo, verbal & tactile cues on technique with prostheses    Curb 4: Min assist;3: Mod assist   curb min A ascending and mod A descending   Curb Details (indicate cue type and reason) PT demo, verbal & tactile cues on technique with prostheses      Neuro Re-ed    Neuro Re-ed Details  fwd reaching 11 inch max, balance on foam pad with feet wide 60 sec progressed to head turns and head nods x10 ea, then feet together on foam pad 30 sec X2      Knee/Hip Exercises: Stretches   Active Hamstring Stretch Right;Left;3 reps;30 seconds    Active Hamstring Stretch Limitations seated with strap      Knee/Hip Exercises: Machines for Strengthening   Total Gym Leg Press shuttle leg press 118# 15 reps 1st set back 45* & 2nd set back flat  single leg stance on ea LE by lifting other LE off plate for ~5QGB.      Knee/Hip Exercises: Standing   Forward Step Up Right;Left;5 reps;Hand Hold: 1   with SPC in Left hand     Prosthetics   Current prosthetic wear tolerance (days/week)  daily    Current prosthetic wear tolerance (#hours/day)  most of awake hours    Residual limb condition  wound healed but thin white epitheal covering so continue Tegaderm for at least 1 week for protection.    Education Provided Skin check   sweat management   Person(s) Educated Patient    Education Method Explanation    Education Method Verbalized understanding                    PT Short Term Goals - 06/12/20 1029      PT SHORT TERM GOAL #1   Title Patient verbalizes proper sweat management with prostheses.    Time 1    Period Months    Status Achieved    Target Date 06/26/20      PT SHORT TERM GOAL #2   Title Patient tolerates  bilateral prostheses >90% awake hrs total /day without skin issues    Time 1    Period Months    Status Achieved    Target Date 06/26/20  PT SHORT TERM GOAL #3   Title Patient able to reach 10" without UE support safely    Baseline met 6/3, 11 inch reach    Time 1    Period Months    Status Achieved    Target Date 06/26/20      PT SHORT TERM GOAL #4   Title Patient ambulates 100' with cane & prostheses including sit / stand chair with armrests with supervision    Baseline 150 feet with supervision on 6/3    Time 1    Period Months    Status Achieved    Target Date 06/26/20      PT SHORT TERM GOAL #5   Title Patient negotiates ramps & curbs with cane & prostheses with minA.    Baseline min A for ramp, min A to descend curb but sill needs mod A to ascend curb    Time 1    Period Months    Status On-going    Target Date 06/26/20             PT Long Term Goals - 04/29/20 1638      PT LONG TERM GOAL #1   Title Patient demonstrates & verbalized understanding of prosthetic care to enable safe utilization of prostheses.    Time 12    Period Weeks    Status New    Target Date 07/23/20      PT LONG TERM GOAL #2   Title Patient tolerates prostheses wear for >90% of awake hours without skin issues    Time 12    Period Weeks    Status New    Target Date 07/23/20      PT LONG TERM GOAL #3   Title Berg Balance >/= 36/56 to indicate lower fall risk    Time 12    Period Weeks    Status New    Target Date 07/23/20      PT LONG TERM GOAL #4   Title Patient ambulates 300' with LRAD & prostheses modified independent    Time 12    Period Weeks    Status New    Target Date 07/23/20      PT LONG TERM GOAL #5   Title Patient negotiates ramps, curbs & stairs with LRAD & prostheses modified independent.    Time 12    Period Weeks    Status New    Target Date 07/23/20                 Plan - 06/12/20 1102    Clinical Impression Statement His wound is looking  good but we will continue to monitor. Progress note today shows he is progressing well with PT and has met all of his short term goals except he still needs mod A at times with navigating curb and ramp with SPC. He will continue to benefit from skilled PT to maximize his functional abilities.    Personal Factors and Comorbidities Comorbidity 3+;Fitness;Time since onset of injury/illness/exacerbation    Comorbidities Left TTA, automatic cardioverter/defibrillator, arthritis, CAD, cardiomyopathy, CHF, COPD, DM2, neuropathy, ESRD, CAD w/catherization, HTN, kidney stones, right TKA 2007    Examination-Activity Limitations Lift;Locomotion Level;Squat;Stairs;Stand;Transfers    Examination-Participation Restrictions Driving;Community Activity    Stability/Clinical Decision Making Evolving/Moderate complexity    Rehab Potential Good    PT Frequency 2x / week    PT Duration 12 weeks    PT Treatment/Interventions ADLs/Self Care Home Management;DME Instruction;Gait training;Stair training;Functional mobility training;Therapeutic activities;Therapeutic exercise;Balance training;Neuromuscular  re-education;Patient/family education;Prosthetic Training;Vestibular    PT Next Visit Plan Do 10th visit note, check wound, work towards updated STGs,  review prosthetic care, prosthetic gait with cane, therapeutic exercise to increase functional strength & endurance.    Consulted and Agree with Plan of Care Patient           Patient will benefit from skilled therapeutic intervention in order to improve the following deficits and impairments:  Abnormal gait,Decreased activity tolerance,Decreased balance,Decreased endurance,Decreased knowledge of use of DME,Decreased mobility,Decreased skin integrity,Decreased strength,Increased edema,Impaired flexibility,Postural dysfunction,Prosthetic Dependency,Pain  Visit Diagnosis: Unsteadiness on feet  Other abnormalities of gait and mobility  Muscle weakness  (generalized)     Problem List Patient Active Problem List   Diagnosis Date Noted  . Malnutrition of moderate degree 11/18/2019  . Below-knee amputation of left lower extremity ([place]) 11/16/2019  . Abscess of left foot 11/13/2019  . Dehiscence of amputation stump ([place])   . History of complete ray amputation of fifth toe of left foot ([place]) 10/09/2019  . Acute pulmonary edema ([facility])   . Osteomyelitis of fifth toe of left foot ([place])   . Febrile illness 08/17/2019  . SIRS (systemic inflammatory response syndrome) ([place]) 08/17/2019  . Thrombocytopenia ([place]) 08/17/2019  . Pressure injury of back, stage 2 ([place]) 08/17/2019  . Hypertensive emergency 07/10/2019  . Onychomycosis 08/16/2016  . Osteopathy in diseases classified elsewhere, unspecified site 05/14/2016  . Status post unilateral below knee amputation, right ([place]) 04/26/2016  . Bilateral carotid bruits 03/30/2016  . Diabetes mellitus with complication ([place])   . Anemia due to chronic kidney disease   . ESRD on dialysis ([place]) 11/30/2015  . Acute respiratory failure with hypoxia ([place]) 11/29/2015  . Dependence on renal dialysis ([place]) 08/15/2015  . Presence of automatic (implantable) cardiac defibrillator 03/12/2015  . At high risk for falls 09/29/2014  . Peripheral vascular disease ([place]) 09/29/2014  . Osteoarthritis of both knees 01/07/2014  . Osteoarthritis of multiple joints 10/06/2013  . NICM (nonischemic cardiomyopathy) ([place]) 06/27/2013  . CAD (coronary artery disease) 03/13/2013  . Abnormal nuclear stress test 01/27/2013  . Chronic systolic congestive heart failure ([place]) 01/27/2013  . COPD (chronic obstructive pulmonary disease) ([place]) 12/27/2012  . Diabetic peripheral neuropathy ([place]) 02/10/2012  . Urethral stricture 10/06/2010  . URINARY CALCULUS 09/28/2009  . Adjustment disorder with depressed mood 08/21/2009  . Poorly controlled type II diabetes mellitus with renal complication ([place]) 63/84/5364  . Hyperlipidemia 04/14/2008   . Essential hypertension 04/14/2008  . Allergic rhinitis 04/14/2008    Debbe Odea ,PT,DPT 06/12/2020, 11:04 AM  [facility] [address] [place], [place], [address] Phone: [phone]   Fax:  [phone]  Name: AMAIR SHROUT MRN: [id number] Date of Birth: [birth year]

## 2020-06-12 NOTE — Telephone Encounter (Signed)
Spoke with the patient. He is aware that this medication is not covered and that the pharmacy has sent [name] an alternative option. He is ok with changing medications if Dr. Elease Hashimoto prefers to change this. He is aware that a message has been sent to Dr. Elease Hashimoto and we are awaiting a response.   Please advise. Pt is completely out of insuline.

## 2020-06-13 DIAGNOSIS — N2581 Secondary hyperparathyroidism of renal origin: Secondary | ICD-10-CM | POA: Diagnosis not present

## 2020-06-13 DIAGNOSIS — D631 Anemia in chronic kidney disease: Secondary | ICD-10-CM | POA: Diagnosis not present

## 2020-06-13 DIAGNOSIS — Z992 Dependence on renal dialysis: Secondary | ICD-10-CM | POA: Diagnosis not present

## 2020-06-13 DIAGNOSIS — E209 Hypoparathyroidism, unspecified: Secondary | ICD-10-CM | POA: Diagnosis not present

## 2020-06-13 DIAGNOSIS — N186 End stage renal disease: Secondary | ICD-10-CM | POA: Diagnosis not present

## 2020-06-13 DIAGNOSIS — D509 Iron deficiency anemia, unspecified: Secondary | ICD-10-CM | POA: Diagnosis not present

## 2020-06-15 ENCOUNTER — Other Ambulatory Visit: Payer: Self-pay

## 2020-06-15 ENCOUNTER — Encounter: Payer: Self-pay | Admitting: Physical Therapy

## 2020-06-15 ENCOUNTER — Ambulatory Visit (INDEPENDENT_AMBULATORY_CARE_PROVIDER_SITE_OTHER): Payer: Medicare Other | Admitting: Physical Therapy

## 2020-06-15 DIAGNOSIS — R2689 Other abnormalities of gait and mobility: Secondary | ICD-10-CM

## 2020-06-15 DIAGNOSIS — M6281 Muscle weakness (generalized): Secondary | ICD-10-CM | POA: Diagnosis not present

## 2020-06-15 DIAGNOSIS — R2681 Unsteadiness on feet: Secondary | ICD-10-CM | POA: Diagnosis not present

## 2020-06-15 NOTE — Therapy (Signed)
[facility] [address] [place], [place], [address] Phone: [phone]   Fax:  [phone]  Physical Therapy Treatment  Patient Details  Name: Anthony Bullock MRN: [id number] Date of Birth: 11/01/1960 Referring Provider (PT): Bevely Palmer Persons, [place]   Encounter Date: 06/15/2020   PT End of Session - 06/15/20 1256    Visit Number 11    Number of Visits 25    Date for PT Re-Evaluation 07/23/20    Authorization Type Medicare & BCBS supplement    PT Start Time 1300    PT Stop Time 1345    PT Time Calculation (min) 45 min    Equipment Utilized During Treatment Gait belt    Activity Tolerance Patient tolerated treatment well;Patient limited by fatigue    Behavior During Therapy [facility] for tasks assessed/performed           Past Medical History:  Diagnosis Date  . 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 Allergic rhinitis   . Anemia   . Arthritis   . Chronic systolic heart failure ([place])    a. ECHO (12/2012) EF 25-30%, HK entireanteroseptal myocardium //  b.  EF 25%, diffuse HK, grade 1 diastolic dysfunction, MAC, mild LAE, normal RVSF, trivial pericardial effusion  . COPD (chronic obstructive pulmonary disease) ([place])   . Diabetes mellitus type II   . Diabetic nephropathy ([place])   . Diabetic neuropathy ([place])   . ESRD on hemodialysis [facility])    started HD June 2017, goes to [facility] HD unit, Dr Hinda Lenis  . History of cardiac catheterization    a.Myoview 1/15:  There is significant left ventricular dysfunction. There may be slight scar at the apex. There is no significant ischemia. LV Ejection Fraction: 27%  //  b. RHC/LHC (1/15) with mean RA 6, PA 47/22 mean 33, mean PCWP 20, PVR 2.5 WU, CI 2.5; 80% dLAD stenosis, 70% diffuse large D.   . History of kidney stones   . Hyperlipidemia   . Hypertension   . Kidney stones   . NICM (nonischemic cardiomyopathy) ([place])    Primarily nonischemic. Echo (12/14) with EF  25-30%. Echo (3/15) with EF 25%, mild to moderately dilated LV, normal RV size and systolic function.   . Osteomyelitis ([place])    left fifth ray  . Pneumonia   . Urethral stricture   . Wears glasses     Past Surgical History:  Procedure Laterality Date  . ABDOMINAL AORTOGRAM W/LOWER EXTREMITY N/A 03/30/2016   Procedure: Abdominal Aortogram w/Lower Extremity;  Surgeon: Angelia Mould, MD;  Location: [place] [facility];  Service: Cardiovascular;  Laterality: N/A;  . AMPUTATION Right 04/26/2016   Procedure: Right Below Knee Amputation;  Surgeon: Newt Minion, MD;  Location: [place];  Service: Orthopedics;  Laterality: Right;  . AMPUTATION Left 08/21/2019   Procedure: LEFT FOOT 5TH RAY AMPUTATION;  Surgeon: Newt Minion, MD;  Location: [place];  Service: Orthopedics;  Laterality: Left;  . AMPUTATION Left 11/13/2019   Procedure: LEFT BELOW KNEE AMPUTATION;  Surgeon: Newt Minion, MD;  Location: [place];  Service: Orthopedics;  Laterality: Left;  . AV FISTULA PLACEMENT Right 09/08/2015   Procedure: INSERTION OF 4-64m x 45cm  ARTERIOVENOUS (AV) GORE-TEX GRAFT RIGHT UPPER  ARM;  Surgeon: CAngelia Mould MD;  [place]  Service: Vascular;  Laterality: Right;  . AV FISTULA PLACEMENT Left 01/14/2016   Procedure: CREATION OF LEFT UPPER ARM ARTERIOVENOUS FISTULA;  Surgeon: CJudeth Cornfield  Scot Dock, MD;  Location: [place];  Service: Vascular;  Laterality: Left;  . BASCILIC VEIN TRANSPOSITION Right 08/22/2014   Procedure: RIGHT UPPER ARM BASCILIC VEIN TRANSPOSITION;  Surgeon: Angelia Mould, MD;  Location: [place];  Service: Vascular;  Laterality: Right;  . BELOW KNEE LEG AMPUTATION Right 04/26/2016  . CARDIAC CATHETERIZATION    . CARDIAC DEFIBRILLATOR PLACEMENT  06/27/2013   Sub Q       BY DR Caryl Comes  . CATARACT EXTRACTION W/PHACO Right 08/06/2018   Procedure: CATARACT EXTRACTION PHACO AND INTRAOCULAR LENS PLACEMENT (IOC);  Surgeon: Baruch Goldmann, MD;  Location: [facility];  Service:  Ophthalmology;  Laterality: Right;  CDE: 4.06  . CATARACT EXTRACTION W/PHACO Left 08/20/2018   Procedure: CATARACT EXTRACTION PHACO AND INTRAOCULAR LENS PLACEMENT (IOC);  Surgeon: Baruch Goldmann, MD;  Location: [facility];  Service: Ophthalmology;  Laterality: Left;  CDE: 6.76  . COLONOSCOPY WITH PROPOFOL N/A 07/22/2015   Procedure: COLONOSCOPY WITH PROPOFOL;  Surgeon: Doran Stabler, MD;  Location: [facility];  Service: Gastroenterology;  Laterality: N/A;  . FEMORAL-POPLITEAL BYPASS GRAFT Right 03/31/2016   Procedure: BYPASS GRAFT FEMORAL-POPLITEAL ARTERY USING RIGHT GREATER SAPHENOUS NONREVERSED VEIN;  Surgeon: Angelia Mould, MD;  Location: [place];  Service: Vascular;  Laterality: Right;  . HERNIA REPAIR    . I & D EXTREMITY Right 03/31/2016   Procedure: IRRIGATION AND DEBRIDEMENT FOOT;  Surgeon: Angelia Mould, MD;  Location: [place];  Service: Vascular;  Laterality: Right;  . IMPLANTABLE CARDIOVERTER DEFIBRILLATOR IMPLANT N/A 06/27/2013   Procedure: SUB Q ICD;  Surgeon: Deboraha Sprang, MD;  Location: [facility] CATH LAB;  Service: Cardiovascular;  Laterality: N/A;  . INTRAOPERATIVE ARTERIOGRAM Right 03/31/2016   Procedure: INTRA OPERATIVE ARTERIOGRAM;  Surgeon: Angelia Mould, MD;  Location: [place];  Service: Vascular;  Laterality: Right;  . IR GENERIC HISTORICAL Right 11/30/2015   IR THROMBECTOMY AV FISTULA W/THROMBOLYSIS/PTA INC/SHUNT/IMG RIGHT 11/30/2015 Aletta Edouard, MD [facility]  . IR GENERIC HISTORICAL  11/30/2015   IR US GUIDE VASC ACCESS RIGHT 11/30/2015 Aletta Edouard, MD [facility]  . IR GENERIC HISTORICAL Right 12/15/2015   IR THROMBECTOMY AV FISTULA W/THROMBOLYSIS/PTA/STENT INC/SHUNT/IMG RT 12/15/2015 Arne Cleveland, MD [facility]  . IR GENERIC HISTORICAL  12/15/2015   IR US GUIDE VASC ACCESS RIGHT 12/15/2015 Arne Cleveland, MD [facility]  . IR GENERIC HISTORICAL  12/28/2015   IR FLUORO GUIDE CV LINE RIGHT 12/28/2015 Marybelle Killings, MD [facility]  . IR GENERIC  HISTORICAL  12/28/2015   IR US GUIDE VASC ACCESS RIGHT 12/28/2015 Marybelle Killings, MD [facility]  . LEFT A ND RIGHT HEART CATH  01/30/2013   DR Sung Amabile  . LEFT AND RIGHT HEART CATHETERIZATION WITH CORONARY ANGIOGRAM N/A 01/30/2013   Procedure: LEFT AND RIGHT HEART CATHETERIZATION WITH CORONARY ANGIOGRAM;  Surgeon: Jolaine Artist, MD;  Location: [facility] CATH LAB;  Service: Cardiovascular;  Laterality: N/A;  . PERIPHERAL VASCULAR CATHETERIZATION Right 01/26/2015   Procedure: A/V Fistulagram;  Surgeon: Angelia Mould, MD;  Location: [place] [facility];  Service: Cardiovascular;  Laterality: Right;  . reapea urethral surgery for recurrent obstruction  2011  . TOTAL KNEE ARTHROPLASTY Right 2007  . VEIN HARVEST Right 03/31/2016   Procedure: RIGHT GREATER SAPHENOUS VEIN HARVEST;  Surgeon: Angelia Mould, MD;  Location: [place];  Service: Vascular;  Laterality: Right;    There were no vitals filed for this visit.   Subjective Assessment - 06/15/20 1256    Subjective He saw prosthetist this morning  who made some changes to left prosthesis.    Patient is accompained by: Family member   wife, Jager Koska   Pertinent History Left TTA, automatic cardioverter/defibrillator, arthritis, CAD, cardiomyopathy, CHF, COPD, DM2, neuropathy, ESRD, CAD w/catherization, HTN, kidney stones, right TKA 2007    Patient Stated Goals Walk in home & community.    Currently in Pain? No/denies                             [facility] Adult PT Treatment/Exercise - 06/15/20 1257      Transfers   Sit to Stand 5: Supervision;With upper extremity assist;With armrests;From chair/3-in-1;Other (comment)   to RW   Sit to Stand Details Visual cues for safe use of DME/AE;Verbal cues for sequencing;Verbal cues for technique    Stand to Sit 5: Supervision;With upper extremity assist;With armrests;To chair/3-in-1;Other (comment)   from RW   Stand to Sit Details (indicate cue type and reason) Visual cues for safe  use of DME/AE;Verbal cues for sequencing;Verbal cues for technique      Ambulation/Gait   Ambulation/Gait Yes    Ambulation/Gait Assistance 5: Supervision;4: Min guard   Min guard cane stand alone tip & supervision RW   Ambulation Distance (Feet) 100 Feet   100' X 2 with RW, 80' cane stand alone tip   Assistive device Rolling walker;Prostheses;Straight cane   cane stand alone tip   Ramp 3: Mod assist   1st time with RW with cues on feeling wt shift over prostheses in stance & progressed to cane stand alone tip with bil TTA prostheses   Ramp Details (indicate cue type and reason) worked on carrying over treadmill incline. tactile & verbal cues on weight shift, step length & upright posture.    Curb 3: Mod assist   cane & bil. TTA prostheses   Curb Details (indicate cue type and reason) worked on carry over from step ups & downs. Alternating lead limb.  PT manual & verbal cues on technique & balance reactions.      High Level Balance   High Level Balance Activities Side stepping;Backward walking;Turns   90* turns, cane stand alone tip & bil TTA prostheses   High Level Balance Comments PT demo, verbal, mirror for visual & tactile cues on technique      Exercises   Exercises Other Exercises;Knee/Hip      Knee/Hip Exercises: Stretches   Active Hamstring Stretch Right;Left;2 reps;30 seconds    Active Hamstring Stretch Limitations supine with strap DF      Knee/Hip Exercises: Machines for Strengthening   Total Gym Leg Press shuttle leg press 118# 15 reps 1st set back 45* & 2nd set back flat  single leg stance on ea LE by lifting other LE off plate for ~4QIH.      Knee/Hip Exercises: Standing   Forward Step Up Right;Left;1 set;Hand Hold: 2;Step Height: 6";5 reps    Forward Step Up Limitations demo, tactile & verbal cues on technique    Step Down Right;Left;1 set;Hand Hold: 2;Step Height: 6";5 reps    Step Down Limitations demo, tactile & verbal cues on technique    Rocker Board 1 minute    round board w/1 pivot points, ant/level/post & right/level/left   Rocker Board Limitations verbal & tactile cues on technique      Knee/Hip Exercises: Seated   Sit to Sand 1 set;10 reps;without UE support   from 24" bar stool with goal to not touch //bars to  stabilize     Prosthetics   Current prosthetic wear tolerance (days/week)  daily    Current prosthetic wear tolerance (#hours/day)  most of awake hours    Edema pitting flucutating with dialysis    Residual limb condition  wound healed but thin white epitheal covering so continue Tegaderm for at least 1 week for protection.    Education Provided Skin check;Residual limb care;Correct ply sock adjustment;Other (comment)   see prosthetic care comments              Balance Exercises - 06/15/20 1300      Balance Exercises: Standing   Standing Eyes Opened Wide (BOA);Foam/compliant surface;Head turns;5 reps   head turns right/left, up/down & diagonals,   Standing Eyes Opened Limitations standing on foam beam inside //bars with intermittent touch & min guard / tactile cues.    Standing Eyes Closed Wide (BOA);5 reps;Head turns;Solid surface   minA for balance reactions & intermittent touch //bars, head turns right/left, up/down & diagonals,   Standing Eyes Closed Limitations standing crossways on foam beam inside //bars with intermittent touch & minA / tactile cues.               PT Short Term Goals - 06/12/20 1029      PT SHORT TERM GOAL #1   Title Patient verbalizes proper sweat management with prostheses.    Time 1    Period Months    Status Achieved    Target Date 06/26/20      PT SHORT TERM GOAL #2   Title Patient tolerates bilateral prostheses >90% awake hrs total /day without skin issues    Time 1    Period Months    Status Achieved    Target Date 06/26/20      PT SHORT TERM GOAL #3   Title Patient able to reach 10" without UE support safely    Baseline met 6/3, 11 inch reach    Time 1    Period Months     Status Achieved    Target Date 06/26/20      PT SHORT TERM GOAL #4   Title Patient ambulates 100' with cane & prostheses including sit / stand chair with armrests with supervision    Baseline 150 feet with supervision on 6/3    Time 1    Period Months    Status Achieved    Target Date 06/26/20      PT SHORT TERM GOAL #5   Title Patient negotiates ramps & curbs with cane & prostheses with minA.    Baseline min A for ramp, min A to descend curb but sill needs mod A to ascend curb    Time 1    Period Months    Status On-going    Target Date 06/26/20             PT Long Term Goals - 04/29/20 1638      PT LONG TERM GOAL #1   Title Patient demonstrates & verbalized understanding of prosthetic care to enable safe utilization of prostheses.    Time 12    Period Weeks    Status New    Target Date 07/23/20      PT LONG TERM GOAL #2   Title Patient tolerates prostheses wear for >90% of awake hours without skin issues    Time 12    Period Weeks    Status New    Target Date 07/23/20      PT LONG TERM  GOAL #3   Title Berg Balance >/= 36/56 to indicate lower fall risk    Time 12    Period Weeks    Status New    Target Date 07/23/20      PT LONG TERM GOAL #4   Title Patient ambulates 300' with LRAD & prostheses modified independent    Time 12    Period Weeks    Status New    Target Date 07/23/20      PT LONG TERM GOAL #5   Title Patient negotiates ramps, curbs & stairs with LRAD & prostheses modified independent.    Time 12    Period Weeks    Status New    Target Date 07/23/20                 Plan - 06/15/20 1256    Clinical Impression Statement Patient improved ramps using treadmill for training & curbs using step ups & downs for training.  Patient is slowly progressing towards STGs and PT anticipates meeting next week by target.    Personal Factors and Comorbidities Comorbidity 3+;Fitness;Time since onset of injury/illness/exacerbation    Comorbidities Left  TTA, automatic cardioverter/defibrillator, arthritis, CAD, cardiomyopathy, CHF, COPD, DM2, neuropathy, ESRD, CAD w/catherization, HTN, kidney stones, right TKA 2007    Examination-Activity Limitations Lift;Locomotion Level;Squat;Stairs;Stand;Transfers    Examination-Participation Restrictions Driving;Community Activity    Stability/Clinical Decision Making Evolving/Moderate complexity    Rehab Potential Good    PT Frequency 2x / week    PT Duration 12 weeks    PT Treatment/Interventions ADLs/Self Care Home Management;DME Instruction;Gait training;Stair training;Functional mobility training;Therapeutic activities;Therapeutic exercise;Balance training;Neuromuscular re-education;Patient/family education;Prosthetic Training;Vestibular    PT Next Visit Plan work towards updated STGs,  review prosthetic care, prosthetic gait with cane including ramps & curbs, therapeutic exercise to increase functional strength & endurance.    Consulted and Agree with Plan of Care Patient           Patient will benefit from skilled therapeutic intervention in order to improve the following deficits and impairments:  Abnormal gait,Decreased activity tolerance,Decreased balance,Decreased endurance,Decreased knowledge of use of DME,Decreased mobility,Decreased skin integrity,Decreased strength,Increased edema,Impaired flexibility,Postural dysfunction,Prosthetic Dependency,Pain  Visit Diagnosis: Other abnormalities of gait and mobility  Muscle weakness (generalized)  Unsteadiness on feet     Problem List Patient Active Problem List   Diagnosis Date Noted  . Malnutrition of moderate degree 11/18/2019  . Below-knee amputation of left lower extremity ([place]) 11/16/2019  . Abscess of left foot 11/13/2019  . Dehiscence of amputation stump ([place])   . History of complete ray amputation of fifth toe of left foot ([place]) 10/09/2019  . Acute pulmonary edema ([facility])   . Osteomyelitis of fifth toe of left foot ([place])   .  Febrile illness 08/17/2019  . SIRS (systemic inflammatory response syndrome) ([place]) 08/17/2019  . Thrombocytopenia ([place]) 08/17/2019  . Pressure injury of back, stage 2 ([place]) 08/17/2019  . Hypertensive emergency 07/10/2019  . Onychomycosis 08/16/2016  . Osteopathy in diseases classified elsewhere, unspecified site 05/14/2016  . Status post unilateral below knee amputation, right ([place]) 04/26/2016  . Bilateral carotid bruits 03/30/2016  . Diabetes mellitus with complication ([place])   . Anemia due to chronic kidney disease   . ESRD on dialysis ([place]) 11/30/2015  . Acute respiratory failure with hypoxia ([place]) 11/29/2015  . Dependence on renal dialysis ([place]) 08/15/2015  . Presence of automatic (implantable) cardiac defibrillator 03/12/2015  . At high risk for falls 09/29/2014  . Peripheral vascular disease ([place]) 09/29/2014  .  Osteoarthritis of both knees 01/07/2014  . Osteoarthritis of multiple joints 10/06/2013  . NICM (nonischemic cardiomyopathy) ([place]) 06/27/2013  . CAD (coronary artery disease) 03/13/2013  . Abnormal nuclear stress test 01/27/2013  . Chronic systolic congestive heart failure ([place]) 01/27/2013  . COPD (chronic obstructive pulmonary disease) ([place]) 12/27/2012  . Diabetic peripheral neuropathy ([place]) 02/10/2012  . Urethral stricture 10/06/2010  . URINARY CALCULUS 09/28/2009  . Adjustment disorder with depressed mood 08/21/2009  . Poorly controlled type II diabetes mellitus with renal complication ([place]) 09/32/6712  . Hyperlipidemia 04/14/2008  . Essential hypertension 04/14/2008  . Allergic rhinitis 04/14/2008    Jamey Reas, PT, DPT 06/15/2020, 3:06 PM  [facility] [address] [place], [place], [address] Phone: [phone]   Fax:  [phone]  Name: Anthony Bullock MRN: [id number] Date of Birth: [birth year]

## 2020-06-16 DIAGNOSIS — Z992 Dependence on renal dialysis: Secondary | ICD-10-CM | POA: Diagnosis not present

## 2020-06-16 DIAGNOSIS — D509 Iron deficiency anemia, unspecified: Secondary | ICD-10-CM | POA: Diagnosis not present

## 2020-06-16 DIAGNOSIS — E209 Hypoparathyroidism, unspecified: Secondary | ICD-10-CM | POA: Diagnosis not present

## 2020-06-16 DIAGNOSIS — N2581 Secondary hyperparathyroidism of renal origin: Secondary | ICD-10-CM | POA: Diagnosis not present

## 2020-06-16 DIAGNOSIS — D631 Anemia in chronic kidney disease: Secondary | ICD-10-CM | POA: Diagnosis not present

## 2020-06-16 DIAGNOSIS — N186 End stage renal disease: Secondary | ICD-10-CM | POA: Diagnosis not present

## 2020-06-17 ENCOUNTER — Encounter: Payer: Self-pay | Admitting: Physical Therapy

## 2020-06-17 ENCOUNTER — Other Ambulatory Visit: Payer: Self-pay

## 2020-06-17 ENCOUNTER — Ambulatory Visit (INDEPENDENT_AMBULATORY_CARE_PROVIDER_SITE_OTHER): Payer: Medicare Other | Admitting: Physical Therapy

## 2020-06-17 DIAGNOSIS — R2689 Other abnormalities of gait and mobility: Secondary | ICD-10-CM

## 2020-06-17 DIAGNOSIS — R2681 Unsteadiness on feet: Secondary | ICD-10-CM

## 2020-06-17 DIAGNOSIS — M6281 Muscle weakness (generalized): Secondary | ICD-10-CM

## 2020-06-17 NOTE — Therapy (Signed)
[facility] [address] [place], [place], [address] Phone: [phone]   Fax:  [phone]  Physical Therapy Treatment  Patient Details  Name: Anthony Bullock MRN: [id number] Date of Birth: 1960/05/28 Referring Provider (PT): Bevely Palmer Persons, [place]   Encounter Date: 06/17/2020   PT End of Session - 06/17/20 1330    Visit Number 12    Number of Visits 25    Date for PT Re-Evaluation 07/23/20    Authorization Type Medicare & BCBS supplement    PT Start Time 1333    PT Stop Time 1418    PT Time Calculation (min) 45 min    Equipment Utilized During Treatment Gait belt    Activity Tolerance Patient tolerated treatment well;Patient limited by fatigue    Behavior During Therapy [facility] for tasks assessed/performed           Past Medical History:  Diagnosis Date  . 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 Allergic rhinitis   . Anemia   . Arthritis   . Chronic systolic heart failure ([place])    a. ECHO (12/2012) EF 25-30%, HK entireanteroseptal myocardium //  b.  EF 25%, diffuse HK, grade 1 diastolic dysfunction, MAC, mild LAE, normal RVSF, trivial pericardial effusion  . COPD (chronic obstructive pulmonary disease) ([place])   . Diabetes mellitus type II   . Diabetic nephropathy ([place])   . Diabetic neuropathy ([place])   . ESRD on hemodialysis [facility])    started HD June 2017, goes to [facility] HD unit, Dr Hinda Lenis  . History of cardiac catheterization    a.Myoview 1/15:  There is significant left ventricular dysfunction. There may be slight scar at the apex. There is no significant ischemia. LV Ejection Fraction: 27%  //  b. RHC/LHC (1/15) with mean RA 6, PA 47/22 mean 33, mean PCWP 20, PVR 2.5 WU, CI 2.5; 80% dLAD stenosis, 70% diffuse large D.   . History of kidney stones   . Hyperlipidemia   . Hypertension   . Kidney stones   . NICM (nonischemic cardiomyopathy) ([place])    Primarily nonischemic. Echo (12/14) with EF  25-30%. Echo (3/15) with EF 25%, mild to moderately dilated LV, normal RV size and systolic function.   . Osteomyelitis ([place])    left fifth ray  . Pneumonia   . Urethral stricture   . Wears glasses     Past Surgical History:  Procedure Laterality Date  . ABDOMINAL AORTOGRAM W/LOWER EXTREMITY N/A 03/30/2016   Procedure: Abdominal Aortogram w/Lower Extremity;  Surgeon: Angelia Mould, MD;  Location: [employer] CV LAB;  Service: Cardiovascular;  Laterality: N/A;  . AMPUTATION Right 04/26/2016   Procedure: Right Below Knee Amputation;  Surgeon: Newt Minion, MD;  Location: [place];  Service: Orthopedics;  Laterality: Right;  . AMPUTATION Left 08/21/2019   Procedure: LEFT FOOT 5TH RAY AMPUTATION;  Surgeon: Newt Minion, MD;  Location: [place];  Service: Orthopedics;  Laterality: Left;  . AMPUTATION Left 11/13/2019   Procedure: LEFT BELOW KNEE AMPUTATION;  Surgeon: Newt Minion, MD;  Location: [place];  Service: Orthopedics;  Laterality: Left;  . AV FISTULA PLACEMENT Right 09/08/2015   Procedure: INSERTION OF 4-20m x 45cm  ARTERIOVENOUS (AV) GORE-TEX GRAFT RIGHT UPPER  ARM;  Surgeon: CAngelia Mould MD;  Location[place]  Service: Vascular;  Laterality: Right;  . AV FISTULA PLACEMENT Left 01/14/2016   Procedure: CREATION OF LEFT UPPER ARM ARTERIOVENOUS FISTULA;  Surgeon: CJudeth Cornfield  Scot Dock, MD;  Location: [place];  Service: Vascular;  Laterality: Left;  . BASCILIC VEIN TRANSPOSITION Right 08/22/2014   Procedure: RIGHT UPPER ARM BASCILIC VEIN TRANSPOSITION;  Surgeon: Angelia Mould, MD;  Location: [place];  Service: Vascular;  Laterality: Right;  . BELOW KNEE LEG AMPUTATION Right 04/26/2016  . CARDIAC CATHETERIZATION    . CARDIAC DEFIBRILLATOR PLACEMENT  06/27/2013   Sub Q       BY DR Caryl Comes  . CATARACT EXTRACTION W/PHACO Right 08/06/2018   Procedure: CATARACT EXTRACTION PHACO AND INTRAOCULAR LENS PLACEMENT (IOC);  Surgeon: Baruch Goldmann, MD;  Location: [facility];  Service:  Ophthalmology;  Laterality: Right;  CDE: 4.06  . CATARACT EXTRACTION W/PHACO Left 08/20/2018   Procedure: CATARACT EXTRACTION PHACO AND INTRAOCULAR LENS PLACEMENT (IOC);  Surgeon: Baruch Goldmann, MD;  Location: [facility];  Service: Ophthalmology;  Laterality: Left;  CDE: 6.76  . COLONOSCOPY WITH PROPOFOL N/A 07/22/2015   Procedure: COLONOSCOPY WITH PROPOFOL;  Surgeon: Doran Stabler, MD;  Location: [facility];  Service: Gastroenterology;  Laterality: N/A;  . FEMORAL-POPLITEAL BYPASS GRAFT Right 03/31/2016   Procedure: BYPASS GRAFT FEMORAL-POPLITEAL ARTERY USING RIGHT GREATER SAPHENOUS NONREVERSED VEIN;  Surgeon: Angelia Mould, MD;  Location: [place];  Service: Vascular;  Laterality: Right;  . HERNIA REPAIR    . I & D EXTREMITY Right 03/31/2016   Procedure: IRRIGATION AND DEBRIDEMENT FOOT;  Surgeon: Angelia Mould, MD;  Location: [place];  Service: Vascular;  Laterality: Right;  . IMPLANTABLE CARDIOVERTER DEFIBRILLATOR IMPLANT N/A 06/27/2013   Procedure: SUB Q ICD;  Surgeon: Deboraha Sprang, MD;  Location: [facility] CATH LAB;  Service: Cardiovascular;  Laterality: N/A;  . INTRAOPERATIVE ARTERIOGRAM Right 03/31/2016   Procedure: INTRA OPERATIVE ARTERIOGRAM;  Surgeon: Angelia Mould, MD;  Location: [place];  Service: Vascular;  Laterality: Right;  . IR GENERIC HISTORICAL Right 11/30/2015   IR THROMBECTOMY AV FISTULA W/THROMBOLYSIS/PTA INC/SHUNT/IMG RIGHT 11/30/2015 Aletta Edouard, MD [facility]  . IR GENERIC HISTORICAL  11/30/2015   IR US GUIDE VASC ACCESS RIGHT 11/30/2015 Aletta Edouard, MD [facility]  . IR GENERIC HISTORICAL Right 12/15/2015   IR THROMBECTOMY AV FISTULA W/THROMBOLYSIS/PTA/STENT INC/SHUNT/IMG RT 12/15/2015 Arne Cleveland, MD [facility]  . IR GENERIC HISTORICAL  12/15/2015   IR US GUIDE VASC ACCESS RIGHT 12/15/2015 Arne Cleveland, MD [facility]  . IR GENERIC HISTORICAL  12/28/2015   IR FLUORO GUIDE CV LINE RIGHT 12/28/2015 Marybelle Killings, MD [facility]  . IR GENERIC  HISTORICAL  12/28/2015   IR US GUIDE VASC ACCESS RIGHT 12/28/2015 Marybelle Killings, MD [facility]  . LEFT A ND RIGHT HEART CATH  01/30/2013   DR Sung Amabile  . LEFT AND RIGHT HEART CATHETERIZATION WITH CORONARY ANGIOGRAM N/A 01/30/2013   Procedure: LEFT AND RIGHT HEART CATHETERIZATION WITH CORONARY ANGIOGRAM;  Surgeon: Jolaine Artist, MD;  Location: [facility] CATH LAB;  Service: Cardiovascular;  Laterality: N/A;  . PERIPHERAL VASCULAR CATHETERIZATION Right 01/26/2015   Procedure: A/V Fistulagram;  Surgeon: Angelia Mould, MD;  Location: [place] CV LAB;  Service: Cardiovascular;  Laterality: Right;  . reapea urethral surgery for recurrent obstruction  2011  . TOTAL KNEE ARTHROPLASTY Right 2007  . VEIN HARVEST Right 03/31/2016   Procedure: RIGHT GREATER SAPHENOUS VEIN HARVEST;  Surgeon: Angelia Mould, MD;  Location: [place];  Service: Vascular;  Laterality: Right;    There were no vitals filed for this visit.   Subjective Assessment - 06/17/20 1330    Subjective No issues since last PT  appt. Wound on left limb is still healed.    Patient is accompained by: Family member   wife, Subhan Hoopes   Pertinent History Left TTA, automatic cardioverter/defibrillator, arthritis, CAD, cardiomyopathy, CHF, COPD, DM2, neuropathy, ESRD, CAD w/catherization, HTN, kidney stones, right TKA 2007    Patient Stated Goals Walk in home & community.    Currently in Pain? No/denies                             [facility] Adult PT Treatment/Exercise - 06/17/20 1330      Transfers   Sit to Stand 5: Supervision;With upper extremity assist;With armrests;From chair/3-in-1;Other (comment)   to RW   Sit to Stand Details Visual cues for safe use of DME/AE;Verbal cues for sequencing;Verbal cues for technique    Stand to Sit 5: Supervision;With upper extremity assist;With armrests;To chair/3-in-1;Other (comment)   from RW   Stand to Sit Details (indicate cue type and reason) Visual cues for safe use of  DME/AE;Verbal cues for sequencing;Verbal cues for technique      Ambulation/Gait   Ambulation/Gait Yes    Ambulation/Gait Assistance 5: Supervision;4: Min guard   Min guard cane stand alone tip & supervision RW   Ambulation/Gait Assistance Details verbal cues on upright posture & wt shift over stance limb    Ambulation Distance (Feet) 100 Feet   100' X 2 with RW, 80' cane stand alone tip   Assistive device Rolling walker;Prostheses;Straight cane   cane stand alone tip   Stairs Yes    Stairs Assistance 5: Supervision    Stairs Assistance Details (indicate cue type and reason) technique with bil. TTA prostheses    Stair Management Technique Two rails;Alternating pattern;Forwards    Number of Stairs 6    Height of Stairs 6    Ramp 3: Mod assist;5: Supervision   1st time with RW supervision with cues on feeling wt shift over prostheses in stance & progressed to cane stand alone tip modA/HHA with bil TTA prostheses   Ramp Details (indicate cue type and reason) PT demo, verbal & tactile cues on technique    Curb 3: Mod assist   cane & bil. TTA prostheses   Curb Details (indicate cue type and reason) PT demo, verbal & tactile cues on technique with prostheses. 1st time leading LLE & 2nd time leading RLE      High Level Balance   High Level Balance Activities --    High Level Balance Comments --      Exercises   Exercises Other Exercises;Knee/Hip      Knee/Hip Exercises: Stretches   Active Hamstring Stretch Right;Left;2 reps;30 seconds    Active Hamstring Stretch Limitations supine with strap DF      Knee/Hip Exercises: Machines for Strengthening   Total Gym Leg Press shuttle leg press 118# 15 reps 1st set back 45* & 2nd set back flat  single leg stance on ea LE by lifting other LE off plate for ~2QJF.      Knee/Hip Exercises: Standing   Hip Flexion Stengthening;Both;1 set;10 reps;Knee straight;Other (comment)   balance, BUE support of cane / pole or chair back, 5# on pulley weight   Hip  Flexion Limitations PT demo, tactile & verbal cues on technique including upirght posture    Hip ADduction Strengthening;Both;1 set;10 reps   balance, BUE support of cane / pole or chair back, 5# on pulley weight   Hip ADduction Limitations PT demo, tactile & verbal  cues on technique including upirght posture    Hip Abduction Stengthening;Both;1 set;10 reps;Knee straight   balance, BUE support of cane / pole or chair back, 5# on pulley weight   Abduction Limitations PT demo, tactile & verbal cues on technique including upirght posture    Hip Extension Stengthening;Both;1 set;10 reps;Knee straight   balance, BUE support of cane / pole or chair back, 5# on pulley weight   Extension Limitations PT demo, tactile & verbal cues on technique including upirght posture    Forward Step Up --    Forward Step Up Limitations --    Step Down --    Step Down Limitations --    Rocker Board --    Rocker Board Limitations --      Knee/Hip Exercises: Seated   Sit to [employer] --      Prosthetics   Current prosthetic wear tolerance (days/week)  daily    Current prosthetic wear tolerance (#hours/day)  most of awake hours    Edema pitting flucutating with dialysis    Residual limb condition  wound healed but thin white epitheal covering so continue Tegaderm for at least 1 week for protection.    Education Provided --                    PT Short Term Goals - 06/12/20 1029      PT SHORT TERM GOAL #1   Title Patient verbalizes proper sweat management with prostheses.    Time 1    Period Months    Status Achieved    Target Date 06/26/20      PT SHORT TERM GOAL #2   Title Patient tolerates bilateral prostheses >90% awake hrs total /day without skin issues    Time 1    Period Months    Status Achieved    Target Date 06/26/20      PT SHORT TERM GOAL #3   Title Patient able to reach 10" without UE support safely    Baseline met 6/3, 11 inch reach    Time 1    Period Months    Status Achieved     Target Date 06/26/20      PT SHORT TERM GOAL #4   Title Patient ambulates 100' with cane & prostheses including sit / stand chair with armrests with supervision    Baseline 150 feet with supervision on 6/3    Time 1    Period Months    Status Achieved    Target Date 06/26/20      PT SHORT TERM GOAL #5   Title Patient negotiates ramps & curbs with cane & prostheses with minA.    Baseline min A for ramp, min A to descend curb but sill needs mod A to ascend curb    Time 1    Period Months    Status On-going    Target Date 06/26/20             PT Long Term Goals - 04/29/20 1638      PT LONG TERM GOAL #1   Title Patient demonstrates & verbalized understanding of prosthetic care to enable safe utilization of prostheses.    Time 12    Period Weeks    Status New    Target Date 07/23/20      PT LONG TERM GOAL #2   Title Patient tolerates prostheses wear for >90% of awake hours without skin issues    Time 12    Period  Weeks    Status New    Target Date 07/23/20      PT LONG TERM GOAL #3   Title Berg Balance >/= 36/56 to indicate lower fall risk    Time 12    Period Weeks    Status New    Target Date 07/23/20      PT LONG TERM GOAL #4   Title Patient ambulates 300' with LRAD & prostheses modified independent    Time 12    Period Weeks    Status New    Target Date 07/23/20      PT LONG TERM GOAL #5   Title Patient negotiates ramps, curbs & stairs with LRAD & prostheses modified independent.    Time 12    Period Weeks    Status New    Target Date 07/23/20                 Plan - 06/17/20 1330    Clinical Impression Statement PT progressed to standing stepping / kicks in 4 directions to work on balance & functional strength.  He improved with weight shift over prostheses on ramp with instruction.  He seems to be progressively improving his strength and thereby function.    Personal Factors and Comorbidities Comorbidity 3+;Fitness;Time since onset of  injury/illness/exacerbation    Comorbidities Left TTA, automatic cardioverter/defibrillator, arthritis, CAD, cardiomyopathy, CHF, COPD, DM2, neuropathy, ESRD, CAD w/catherization, HTN, kidney stones, right TKA 2007    Examination-Activity Limitations Lift;Locomotion Level;Squat;Stairs;Stand;Transfers    Examination-Participation Restrictions Driving;Community Activity    Stability/Clinical Decision Making Evolving/Moderate complexity    Rehab Potential Good    PT Frequency 2x / week    PT Duration 12 weeks    PT Treatment/Interventions ADLs/Self Care Home Management;DME Instruction;Gait training;Stair training;Functional mobility training;Therapeutic activities;Therapeutic exercise;Balance training;Neuromuscular re-education;Patient/family education;Prosthetic Training;Vestibular    PT Next Visit Plan work towards updated STGs,  review prosthetic care, prosthetic gait with cane including ramps & curbs, therapeutic exercise to increase functional strength & endurance.    Consulted and Agree with Plan of Care Patient           Patient will benefit from skilled therapeutic intervention in order to improve the following deficits and impairments:  Abnormal gait,Decreased activity tolerance,Decreased balance,Decreased endurance,Decreased knowledge of use of DME,Decreased mobility,Decreased skin integrity,Decreased strength,Increased edema,Impaired flexibility,Postural dysfunction,Prosthetic Dependency,Pain  Visit Diagnosis: Other abnormalities of gait and mobility  Muscle weakness (generalized)  Unsteadiness on feet     Problem List Patient Active Problem List   Diagnosis Date Noted  . Malnutrition of moderate degree 11/18/2019  . Below-knee amputation of left lower extremity ([place]) 11/16/2019  . Abscess of left foot 11/13/2019  . Dehiscence of amputation stump ([place])   . History of complete ray amputation of fifth toe of left foot ([place]) 10/09/2019  . Acute pulmonary edema ([facility])   .  Osteomyelitis of fifth toe of left foot ([place])   . Febrile illness 08/17/2019  . SIRS (systemic inflammatory response syndrome) ([place]) 08/17/2019  . Thrombocytopenia ([place]) 08/17/2019  . Pressure injury of back, stage 2 ([place]) 08/17/2019  . Hypertensive emergency 07/10/2019  . Onychomycosis 08/16/2016  . Osteopathy in diseases classified elsewhere, unspecified site 05/14/2016  . Status post unilateral below knee amputation, right ([place]) 04/26/2016  . Bilateral carotid bruits 03/30/2016  . Diabetes mellitus with complication ([place])   . Anemia due to chronic kidney disease   . ESRD on dialysis ([place]) 11/30/2015  . Acute respiratory failure with hypoxia ([place]) 11/29/2015  . Dependence on  renal dialysis ([place]) 08/15/2015  . Presence of automatic (implantable) cardiac defibrillator 03/12/2015  . At high risk for falls 09/29/2014  . Peripheral vascular disease ([place]) 09/29/2014  . Osteoarthritis of both knees 01/07/2014  . Osteoarthritis of multiple joints 10/06/2013  . NICM (nonischemic cardiomyopathy) ([place]) 06/27/2013  . CAD (coronary artery disease) 03/13/2013  . Abnormal nuclear stress test 01/27/2013  . Chronic systolic congestive heart failure ([place]) 01/27/2013  . COPD (chronic obstructive pulmonary disease) ([place]) 12/27/2012  . Diabetic peripheral neuropathy ([place]) 02/10/2012  . Urethral stricture 10/06/2010  . URINARY CALCULUS 09/28/2009  . Adjustment disorder with depressed mood 08/21/2009  . Poorly controlled type II diabetes mellitus with renal complication ([place]) 98/92/1194  . Hyperlipidemia 04/14/2008  . Essential hypertension 04/14/2008  . Allergic rhinitis 04/14/2008    Jamey Reas, PT, DPT 06/17/2020, 2:24 PM  [facility] [address] [place], [place], [address] Phone: [phone]   Fax:  [phone]  Name: Anthony Bullock MRN: [id number] Date of Birth: 1960-10-30

## 2020-06-18 DIAGNOSIS — E209 Hypoparathyroidism, unspecified: Secondary | ICD-10-CM | POA: Diagnosis not present

## 2020-06-18 DIAGNOSIS — N186 End stage renal disease: Secondary | ICD-10-CM | POA: Diagnosis not present

## 2020-06-18 DIAGNOSIS — D631 Anemia in chronic kidney disease: Secondary | ICD-10-CM | POA: Diagnosis not present

## 2020-06-18 DIAGNOSIS — N2581 Secondary hyperparathyroidism of renal origin: Secondary | ICD-10-CM | POA: Diagnosis not present

## 2020-06-18 DIAGNOSIS — Z992 Dependence on renal dialysis: Secondary | ICD-10-CM | POA: Diagnosis not present

## 2020-06-18 DIAGNOSIS — D509 Iron deficiency anemia, unspecified: Secondary | ICD-10-CM | POA: Diagnosis not present

## 2020-06-20 DIAGNOSIS — D509 Iron deficiency anemia, unspecified: Secondary | ICD-10-CM | POA: Diagnosis not present

## 2020-06-20 DIAGNOSIS — N2581 Secondary hyperparathyroidism of renal origin: Secondary | ICD-10-CM | POA: Diagnosis not present

## 2020-06-20 DIAGNOSIS — N186 End stage renal disease: Secondary | ICD-10-CM | POA: Diagnosis not present

## 2020-06-20 DIAGNOSIS — D631 Anemia in chronic kidney disease: Secondary | ICD-10-CM | POA: Diagnosis not present

## 2020-06-20 DIAGNOSIS — E209 Hypoparathyroidism, unspecified: Secondary | ICD-10-CM | POA: Diagnosis not present

## 2020-06-20 DIAGNOSIS — Z992 Dependence on renal dialysis: Secondary | ICD-10-CM | POA: Diagnosis not present

## 2020-06-22 ENCOUNTER — Ambulatory Visit (INDEPENDENT_AMBULATORY_CARE_PROVIDER_SITE_OTHER): Payer: Medicare Other | Admitting: Physical Therapy

## 2020-06-22 ENCOUNTER — Encounter: Payer: Self-pay | Admitting: Physical Therapy

## 2020-06-22 ENCOUNTER — Other Ambulatory Visit: Payer: Self-pay

## 2020-06-22 DIAGNOSIS — M6281 Muscle weakness (generalized): Secondary | ICD-10-CM

## 2020-06-22 DIAGNOSIS — R2681 Unsteadiness on feet: Secondary | ICD-10-CM | POA: Diagnosis not present

## 2020-06-22 DIAGNOSIS — R2689 Other abnormalities of gait and mobility: Secondary | ICD-10-CM

## 2020-06-22 NOTE — Therapy (Signed)
[facility] [address] [place], [place], [address] Phone: [phone]   Fax:  [phone]  Physical Therapy Treatment  Patient Details  Name: Anthony Bullock MRN: [id number] Date of Birth: 02/09/1960 Referring Provider (PT): Bevely Palmer Persons, [place]   Encounter Date: 06/22/2020   PT End of Session - 06/22/20 1301     Visit Number 13    Number of Visits 25    Date for PT Re-Evaluation 07/23/20    Authorization Type Medicare & BCBS supplement    PT Start Time 1300    PT Stop Time 1344    PT Time Calculation (min) 44 min    Equipment Utilized During Treatment Gait belt    Activity Tolerance Patient tolerated treatment well;Patient limited by fatigue    Behavior During Therapy WFL for tasks assessed/performed             Past Medical History:  Diagnosis Date   AICD (automatic cardioverter/defibrillator) present    [employer]   Allergic rhinitis    Anemia    Arthritis    Chronic systolic heart failure ([place])    a. ECHO (12/2012) EF 25-30%, HK entireanteroseptal myocardium //  b.  EF 25%, diffuse HK, grade 1 diastolic dysfunction, MAC, mild LAE, normal RVSF, trivial pericardial effusion   COPD (chronic obstructive pulmonary disease) ([place])    Diabetes mellitus type II    Diabetic nephropathy ([place])    Diabetic neuropathy ([place])    ESRD on hemodialysis ([place])    started HD June 2017, goes to [facility] HD unit, Dr Hinda Lenis   History of cardiac catheterization    a.Myoview 1/15:  There is significant left ventricular dysfunction. There may be slight scar at the apex. There is no significant ischemia. LV Ejection Fraction: 27%  //  b. RHC/LHC (1/15) with mean RA 6, PA 47/22 mean 33, mean PCWP 20, PVR 2.5 WU, CI 2.5; 80% dLAD stenosis, 70% diffuse large D.     History of kidney stones    Hyperlipidemia    Hypertension    Kidney stones    NICM (nonischemic cardiomyopathy) ([place])    Primarily nonischemic.  Echo (12/14) with EF 25-30%.   Echo (3/15) with EF 25%, mild to moderately dilated LV, normal RV size and systolic function.     Osteomyelitis ([place])    left fifth ray   Pneumonia    Urethral stricture    Wears glasses     Past Surgical History:  Procedure Laterality Date   ABDOMINAL AORTOGRAM W/LOWER EXTREMITY N/A 03/30/2016   Procedure: Abdominal Aortogram w/Lower Extremity;  Surgeon: Angelia Mould, MD;  Location: [place] [facility];  Service: Cardiovascular;  Laterality: N/A;   AMPUTATION Right 04/26/2016   Procedure: Right Below Knee Amputation;  Surgeon: Newt Minion, MD;  Location: [place];  Service: Orthopedics;  Laterality: Right;   AMPUTATION Left 08/21/2019   Procedure: LEFT FOOT 5TH RAY AMPUTATION;  Surgeon: Newt Minion, MD;  Location: [place];  Service: Orthopedics;  Laterality: Left;   AMPUTATION Left 11/13/2019   Procedure: LEFT BELOW KNEE AMPUTATION;  Surgeon: Newt Minion, MD;  Location: [place];  Service: Orthopedics;  Laterality: Left;   AV FISTULA PLACEMENT Right 09/08/2015   Procedure: INSERTION OF 4-5m x 45cm  ARTERIOVENOUS (AV) GORE-TEX GRAFT RIGHT UPPER  ARM;  Surgeon: CAngelia Mould MD;  Location: [place]  Service: Vascular;  Laterality: Right;   AV FISTULA PLACEMENT Left 01/14/2016   Procedure: CREATION OF LEFT UPPER  ARM ARTERIOVENOUS FISTULA;  Surgeon: Angelia Mould, MD;  Location: [place];  Service: Vascular;  Laterality: Left;   [place] Right 08/22/2014   Procedure: RIGHT UPPER ARM [place];  Surgeon: Angelia Mould, MD;  Location: [place];  Service: Vascular;  Laterality: Right;   BELOW KNEE LEG AMPUTATION Right 04/26/2016   CARDIAC CATHETERIZATION     CARDIAC DEFIBRILLATOR PLACEMENT  06/27/2013   Sub Q       BY DR Caryl Comes   CATARACT EXTRACTION W/PHACO Right 08/06/2018   Procedure: CATARACT EXTRACTION PHACO AND INTRAOCULAR LENS PLACEMENT ([place]);  Surgeon: Baruch Goldmann, MD;  Location: [facility];  Service: Ophthalmology;  Laterality: Right;   CDE: 4.06   CATARACT EXTRACTION W/PHACO Left 08/20/2018   Procedure: CATARACT EXTRACTION PHACO AND INTRAOCULAR LENS PLACEMENT (IOC);  Surgeon: Baruch Goldmann, MD;  Location: [facility];  Service: Ophthalmology;  Laterality: Left;  CDE: 6.76   COLONOSCOPY WITH PROPOFOL N/A 07/22/2015   Procedure: COLONOSCOPY WITH PROPOFOL;  Surgeon: Doran Stabler, MD;  [facility]: [facility];  Service: Gastroenterology;  Laterality: N/A;   FEMORAL-POPLITEAL BYPASS GRAFT Right 03/31/2016   Procedure: BYPASS GRAFT FEMORAL-POPLITEAL ARTERY USING RIGHT GREATER SAPHENOUS NONREVERSED VEIN;  Surgeon: Angelia Mould, MD;  Location: [place];  Service: Vascular;  Laterality: Right;   HERNIA REPAIR     I & D EXTREMITY Right 03/31/2016   Procedure: IRRIGATION AND DEBRIDEMENT FOOT;  Surgeon: Angelia Mould, MD;  Location: [place];  Service: Vascular;  Laterality: Right;   IMPLANTABLE CARDIOVERTER DEFIBRILLATOR IMPLANT N/A 06/27/2013   Procedure: SUB Q ICD;  Surgeon: Deboraha Sprang, MD;  Location: [facility] C[facility] LAB;  Service: Cardiovascular;  Laterality: N/A;   INTRAOPERATIVE ARTERIOGRAM Right 03/31/2016   Procedure: INTRA OPERATIVE ARTERIOGRAM;  Surgeon: Angelia Mould, MD;  Location: [place];  Service: Vascular;  Laterality: Right;   IR GENERIC HISTORICAL Right 11/30/2015   IR THROMBECTOMY AV FISTULA W/THROMBOLYSIS/PTA INC/SHUNT/IMG RIGHT 11/30/2015 Aletta Edouard, MD [facility]   IR GENERIC HISTORICAL  11/30/2015   IR US GUIDE VASC ACCESS RIGHT 11/30/2015 Aletta Edouard, MD [facility]   IR GENERIC HISTORICAL Right 12/15/2015   IR THROMBECTOMY AV FISTULA W/THROMBOLYSIS/PTA/STENT INC/SHUNT/IMG RT 12/15/2015 Arne Cleveland, MD [facility]   IR GENERIC HISTORICAL  12/15/2015   IR US GUIDE VASC ACCESS RIGHT 12/15/2015 Arne Cleveland, MD [facility]   IR GENERIC HISTORICAL  12/28/2015   IR FLUORO GUIDE CV LINE RIGHT 12/28/2015 Marybelle Killings, MD [facility]   IR GENERIC HISTORICAL  12/28/2015   IR US GUIDE VASC ACCESS  RIGHT 12/28/2015 Marybelle Killings, MD [facility]   LEFT A ND RIGHT HEART CATH  01/30/2013   DR Sung Amabile   LEFT AND RIGHT HEART CATHETERIZATION WITH CORONARY ANGIOGRAM N/A 01/30/2013   Procedure: LEFT AND RIGHT HEART CATHETERIZATION WITH CORONARY ANGIOGRAM;  Surgeon: Jolaine Artist, MD;  Location: [facility] CATH LAB;  Service: Cardiovascular;  Laterality: N/A;   PERIPHERAL VASCULAR CATHETERIZATION Right 01/26/2015   Procedure: A/V Fistulagram;  Surgeon: Angelia Mould, MD;  Location: [place] [facility];  Service: Cardiovascular;  Laterality: Right;   reapea urethral surgery for recurrent obstruction  2011   TOTAL KNEE ARTHROPLASTY Right 2007   VEIN HARVEST Right 03/31/2016   Procedure: RIGHT GREATER SAPHENOUS VEIN HARVEST;  Surgeon: Angelia Mould, MD;  Location: [place];  Service: Vascular;  Laterality: Right;    There were no vitals filed for this visit.   Subjective Assessment - 06/22/20 1300  Subjective He started working on stairs & ramp outside and inside returning to ADLs like dishes.    Patient is accompained by: Family member   wife, Waller Marcussen   Pertinent History Left TTA, automatic cardioverter/defibrillator, arthritis, CAD, cardiomyopathy, CHF, COPD, DM2, neuropathy, ESRD, CAD w/catherization, HTN, kidney stones, right TKA 2007    Patient Stated Goals Walk in home & community.    Currently in Pain? No/denies                               [facility] Adult PT Treatment/Exercise - 06/22/20 1300       Transfers   Sit to Stand 5: Supervision;With upper extremity assist;With armrests;From chair/3-in-1;Other (comment)   to RW   Sit to Stand Details Visual cues for safe use of DME/AE;Verbal cues for sequencing;Verbal cues for technique    Stand to Sit 5: Supervision;With upper extremity assist;With armrests;To chair/3-in-1;Other (comment)   from RW   Stand to Sit Details (indicate cue type and reason) Visual cues for safe use of DME/AE;Verbal cues for  sequencing;Verbal cues for technique      Ambulation/Gait   Ambulation/Gait Yes    Ambulation/Gait Assistance 5: Supervision;4: Min guard   Min guard cane stand alone tip & supervision RW   Ambulation Distance (Feet) 100 Feet   100' X 2 with RW, 80' cane stand alone tip   Assistive device Rolling walker;Prostheses;Straight cane   cane stand alone tip   Stairs --    Stairs Assistance --    Stair Management Technique --    Number of Stairs --    Height of Stairs --    Ramp 3: Mod assist;5: Supervision   1st time with RW supervision with cues on feeling wt shift over prostheses in stance & progressed to cane stand alone tip modA/HHA with bil TTA prostheses   Ramp Details (indicate cue type and reason) PT demo, verbal & tactile cues on technique    Curb 3: Mod assist   cane & bil. TTA prostheses   Curb Details (indicate cue type and reason) PT demo, verbal & tactile cues on technique    Pre-Gait Activities standing on ramp using cane & window ledge for BUE support - stepping up & down ea LE 2 reps ea.    Gait Comments gait inside //bars wthout UE support with supervion 7' X 3      High Level Balance   High Level Balance Activities Backward walking;Side stepping   inslde //bars without UE support 7' X 3 laps ea.   High Level Balance Comments PT demo, verbal, mirror for visual & tactile cues on technique      Neuro Re-ed    Neuro Re-ed Details  turning 360* quarter turns [place] inside //bars without touch      Exercises   Exercises Other Exercises;Knee/Hip      Knee/Hip Exercises: Stretches   Active Hamstring Stretch Right;Left;2 reps;30 seconds    Active Hamstring Stretch Limitations supine with strap DF      Knee/Hip Exercises: Machines for Strengthening   Total Gym Leg Press shuttle leg press 125# 15 reps 1st set back 45* & 2nd set back flat  single leg stance on ea LE by lifting other LE off plate for ~3AGT.      Knee/Hip Exercises: Standing   Hip Flexion Stengthening;Both;1  set;Knee straight;Other (comment);5 reps   balance, BUE support of cane / pole or chair back,  5# on pulley weight   Hip Flexion Limitations PT demo, tactile & verbal cues on technique including upirght posture    Hip ADduction Strengthening;Both;1 set;5 reps   balance, BUE support of cane / pole or chair back, 5# on pulley weight   Hip ADduction Limitations PT demo, tactile & verbal cues on technique including upirght posture    Hip Abduction Stengthening;Both;1 set;Knee straight;5 reps   balance, BUE support of cane / pole or chair back, 5# on pulley weight   Abduction Limitations PT demo, tactile & verbal cues on technique including upirght posture    Hip Extension Stengthening;Both;1 set;Knee straight;5 reps   balance, BUE support of cane / pole or chair back, 5# on pulley weight   Extension Limitations PT demo, tactile & verbal cues on technique including upirght posture      Knee/Hip Exercises: Seated   Sit to Sand 1 set;10 reps;without UE support   from 24" bar stool     Prosthetics   Prosthetic Care Comments  Left prosthesis was rotated on socket. PT reviewed may need to increase ply socks during day as fluid is pumped out of limb. To reset you must unseat  tissue from socket and reseat limb into socket with proper rotation.    Current prosthetic wear tolerance (days/week)  daily    Current prosthetic wear tolerance (#hours/day)  most of awake hours    Edema pitting flucutating with dialysis    Residual limb condition  wound healed but thin white epitheal covering so continue Tegaderm for at least 1 week for protection.    Education Provided Other (comment)   see prosthetic care comments   Person(s) Educated Patient    Education Method Explanation;Verbal cues;Demonstration    Education Method Verbalized understanding;Returned demonstration;Verbal cues required;Needs further instruction                      PT Short Term Goals - 06/12/20 1029       PT SHORT TERM GOAL #1    Title Patient verbalizes proper sweat management with prostheses.    Time 1    Period Months    Status Achieved    Target Date 06/26/20      PT SHORT TERM GOAL #2   Title Patient tolerates bilateral prostheses >90% awake hrs total /day without skin issues    Time 1    Period Months    Status Achieved    Target Date 06/26/20      PT SHORT TERM GOAL #3   Title Patient able to reach 10" without UE support safely    Baseline met 6/3, 11 inch reach    Time 1    Period Months    Status Achieved    Target Date 06/26/20      PT SHORT TERM GOAL #4   Title Patient ambulates 100' with cane & prostheses including sit / stand chair with armrests with supervision    Baseline 150 feet with supervision on 6/3    Time 1    Period Months    Status Achieved    Target Date 06/26/20      PT SHORT TERM GOAL #5   Title Patient negotiates ramps & curbs with cane & prostheses with minA.    Baseline min A for ramp, min A to descend curb but sill needs mod A to ascend curb    Time 1    Period Months    Status On-going    Target  Date 06/26/20               PT Long Term Goals - 04/29/20 1638       PT LONG TERM GOAL #1   Title Patient demonstrates & verbalized understanding of prosthetic care to enable safe utilization of prostheses.    Time 12    Period Weeks    Status New    Target Date 07/23/20      PT LONG TERM GOAL #2   Title Patient tolerates prostheses wear for >90% of awake hours without skin issues    Time 12    Period Weeks    Status New    Target Date 07/23/20      PT LONG TERM GOAL #3   Title Berg Balance >/= 36/56 to indicate lower fall risk    Time 12    Period Weeks    Status New    Target Date 07/23/20      PT LONG TERM GOAL #4   Title Patient ambulates 300' with LRAD & prostheses modified independent    Time 12    Period Weeks    Status New    Target Date 07/23/20      PT LONG TERM GOAL #5   Title Patient negotiates ramps, curbs & stairs with LRAD &  prostheses modified independent.    Time 12    Period Weeks    Status New    Target Date 07/23/20                   Plan - 06/22/20 1301     Clinical Impression Statement PT session focused on functional strengthening exercises which he seems improved.  PT worked on negotiating ramps & curbs with cane and he improved with instruction.    Personal Factors and Comorbidities Comorbidity 3+;Fitness;Time since onset of injury/illness/exacerbation    Comorbidities Left TTA, automatic cardioverter/defibrillator, arthritis, CAD, cardiomyopathy, CHF, COPD, DM2, neuropathy, ESRD, CAD w/catherization, HTN, kidney stones, right TKA 2007    Examination-Activity Limitations Lift;Locomotion Level;Squat;Stairs;Stand;Transfers    Examination-Participation Restrictions Driving;Community Activity    Stability/Clinical Decision Making Evolving/Moderate complexity    Rehab Potential Good    PT Frequency 2x / week    PT Duration 12 weeks    PT Treatment/Interventions ADLs/Self Care Home Management;DME Instruction;Gait training;Stair training;Functional mobility training;Therapeutic activities;Therapeutic exercise;Balance training;Neuromuscular re-education;Patient/family education;Prosthetic Training;Vestibular    PT Next Visit Plan work towards [place],  review prosthetic care, prosthetic gait with cane including ramps & curbs, therapeutic exercise to increase functional strength & endurance.    Consulted and Agree with Plan of Care Patient             Patient will benefit from skilled therapeutic intervention in order to improve the following deficits and impairments:  Abnormal gait, Decreased activity tolerance, Decreased balance, Decreased endurance, Decreased knowledge of use of DME, Decreased mobility, Decreased skin integrity, Decreased strength, Increased edema, Impaired flexibility, Postural dysfunction, Prosthetic Dependency, Pain  Visit Diagnosis: Other abnormalities of gait and  mobility  Muscle weakness (generalized)  Unsteadiness on feet     Problem List Patient Active Problem List   Diagnosis Date Noted   Malnutrition of moderate degree 11/18/2019   Below-knee amputation of left lower extremity ([place]) 11/16/2019   Abscess of left foot 11/13/2019   Dehiscence of amputation stump ([place])    History of complete ray amputation of fifth toe of left foot ([place]) 10/09/2019   Acute pulmonary edema ([facility])    Osteomyelitis of fifth toe  of left foot ([place])    Febrile illness 08/17/2019   SIRS (systemic inflammatory response syndrome) ([place]) 08/17/2019   Thrombocytopenia ([place]) 08/17/2019   Pressure injury of back, stage 2 ([place]) 08/17/2019   Hypertensive emergency 07/10/2019   Onychomycosis 08/16/2016   Osteopathy in diseases classified elsewhere, unspecified site 05/14/2016   Status post unilateral below knee amputation, right ([place]) 04/26/2016   Bilateral carotid bruits 03/30/2016   Diabetes mellitus with complication ([place])    Anemia due to chronic kidney disease    ESRD on dialysis ([place]) 11/30/2015   Acute respiratory failure with hypoxia ([place]) 11/29/2015   Dependence on renal dialysis ([place]) 08/15/2015   Presence of automatic (implantable) cardiac defibrillator 03/12/2015   At high risk for falls 09/29/2014   Peripheral vascular disease ([place]) 09/29/2014   Osteoarthritis of both knees 01/07/2014   Osteoarthritis of multiple joints 10/06/2013   NICM (nonischemic cardiomyopathy) ([place]) 06/27/2013   CAD (coronary artery disease) 03/13/2013   Abnormal nuclear stress test 30/94/0768   Chronic systolic congestive heart failure ([place]) 01/27/2013   COPD (chronic obstructive pulmonary disease) ([place]) 12/27/2012   Diabetic peripheral neuropathy ([place]) 02/10/2012   Urethral stricture 10/06/2010   URINARY CALCULUS 09/28/2009   Adjustment disorder with depressed mood 08/21/2009   Poorly controlled type II diabetes mellitus with renal complication ([place]) 08/81/1031    Hyperlipidemia 04/14/2008   Essential hypertension 04/14/2008   Allergic rhinitis 04/14/2008    Jamey Reas, PT, DPT 06/22/2020, 3:37 PM  [facility] [address] [place], [place], [address] Phone: [phone]   Fax:  [phone]  Name: Anthony Bullock MRN: [id number] Date of Birth: Jun 17, 1960

## 2020-06-23 DIAGNOSIS — N2581 Secondary hyperparathyroidism of renal origin: Secondary | ICD-10-CM | POA: Diagnosis not present

## 2020-06-23 DIAGNOSIS — D509 Iron deficiency anemia, unspecified: Secondary | ICD-10-CM | POA: Diagnosis not present

## 2020-06-23 DIAGNOSIS — Z992 Dependence on renal dialysis: Secondary | ICD-10-CM | POA: Diagnosis not present

## 2020-06-23 DIAGNOSIS — E209 Hypoparathyroidism, unspecified: Secondary | ICD-10-CM | POA: Diagnosis not present

## 2020-06-23 DIAGNOSIS — N186 End stage renal disease: Secondary | ICD-10-CM | POA: Diagnosis not present

## 2020-06-23 DIAGNOSIS — D631 Anemia in chronic kidney disease: Secondary | ICD-10-CM | POA: Diagnosis not present

## 2020-06-24 ENCOUNTER — Encounter: Payer: Self-pay | Admitting: Physical Therapy

## 2020-06-24 ENCOUNTER — Ambulatory Visit (INDEPENDENT_AMBULATORY_CARE_PROVIDER_SITE_OTHER): Payer: Medicare Other | Admitting: Physical Therapy

## 2020-06-24 ENCOUNTER — Other Ambulatory Visit: Payer: Self-pay

## 2020-06-24 DIAGNOSIS — R2689 Other abnormalities of gait and mobility: Secondary | ICD-10-CM

## 2020-06-24 DIAGNOSIS — M6281 Muscle weakness (generalized): Secondary | ICD-10-CM

## 2020-06-24 DIAGNOSIS — R2681 Unsteadiness on feet: Secondary | ICD-10-CM

## 2020-06-24 NOTE — Therapy (Signed)
[facility] [address] [place], [place], [address] Phone: [phone]   Fax:  [phone]  Physical Therapy Treatment  Patient Details  Name: Anthony Bullock MRN: [id number] Date of Birth: May 18, 1960 Referring Provider (PT): Bevely Palmer Persons, [place]   Encounter Date: 06/24/2020   PT End of Session - 06/24/20 1342     Visit Number 14    Number of Visits 25    Date for PT Re-Evaluation 07/23/20    Authorization Type Medicare & BCBS supplement    PT Start Time 1345    PT Stop Time 1423    PT Time Calculation (min) 38 min    Equipment Utilized During Treatment Gait belt    Activity Tolerance Patient tolerated treatment well;Patient limited by fatigue    Behavior During Therapy WFL for tasks assessed/performed             Past Medical History:  Diagnosis Date   AICD (automatic cardioverter/defibrillator) present    [employer]   Allergic rhinitis    Anemia    Arthritis    Chronic systolic heart failure ([place])    a. ECHO (12/2012) EF 25-30%, HK entireanteroseptal myocardium //  b.  EF 25%, diffuse HK, grade 1 diastolic dysfunction, MAC, mild LAE, normal RVSF, trivial pericardial effusion   COPD (chronic obstructive pulmonary disease) ([place])    Diabetes mellitus type II    Diabetic nephropathy ([place])    Diabetic neuropathy ([place])    ESRD on hemodialysis ([place])    started HD June 2017, goes to [facility] HD unit, Dr Hinda Lenis   History of cardiac catheterization    a.Myoview 1/15:  There is significant left ventricular dysfunction. There may be slight scar at the apex. There is no significant ischemia. LV Ejection Fraction: 27%  //  b. RHC/LHC (1/15) with mean RA 6, PA 47/22 mean 33, mean PCWP 20, PVR 2.5 WU, CI 2.5; 80% dLAD stenosis, 70% diffuse large D.     History of kidney stones    Hyperlipidemia    Hypertension    Kidney stones    NICM (nonischemic cardiomyopathy) ([place])    Primarily nonischemic.  Echo (12/14) with EF 25-30%.   Echo (3/15) with EF 25%, mild to moderately dilated LV, normal RV size and systolic function.     Osteomyelitis ([place])    left fifth ray   Pneumonia    Urethral stricture    Wears glasses     Past Surgical History:  Procedure Laterality Date   ABDOMINAL AORTOGRAM W/LOWER EXTREMITY N/A 03/30/2016   Procedure: Abdominal Aortogram w/Lower Extremity;  Surgeon: Angelia Mould, MD;  Location: [place] [facility];  Service: Cardiovascular;  Laterality: N/A;   AMPUTATION Right 04/26/2016   Procedure: Right Below Knee Amputation;  Surgeon: Newt Minion, MD;  Location: [place];  Service: Orthopedics;  Laterality: Right;   AMPUTATION Left 08/21/2019   Procedure: LEFT FOOT 5TH RAY AMPUTATION;  Surgeon: Newt Minion, MD;  Location: [place];  Service: Orthopedics;  Laterality: Left;   AMPUTATION Left 11/13/2019   Procedure: LEFT BELOW KNEE AMPUTATION;  Surgeon: Newt Minion, MD;  Location: [place];  Service: Orthopedics;  Laterality: Left;   AV FISTULA PLACEMENT Right 09/08/2015   Procedure: INSERTION OF 4-64m x 45cm  ARTERIOVENOUS (AV) GORE-TEX GRAFT RIGHT UPPER  ARM;  Surgeon: CAngelia Mould MD;  [place]  Service: Vascular;  Laterality: Right;   AV FISTULA PLACEMENT Left 01/14/2016   Procedure: CREATION OF LEFT UPPER  ARM ARTERIOVENOUS FISTULA;  Surgeon: Angelia Mould, MD;  Location: [place];  Service: Vascular;  Laterality: Left;   [place] Right 08/22/2014   Procedure: RIGHT UPPER ARM [place];  Surgeon: Angelia Mould, MD;  Location: [place];  Service: Vascular;  Laterality: Right;   BELOW KNEE LEG AMPUTATION Right 04/26/2016   CARDIAC CATHETERIZATION     CARDIAC DEFIBRILLATOR PLACEMENT  06/27/2013   Sub Q       BY DR Caryl Comes   CATARACT EXTRACTION W/PHACO Right 08/06/2018   Procedure: CATARACT EXTRACTION PHACO AND INTRAOCULAR LENS PLACEMENT ([place]);  Surgeon: Baruch Goldmann, MD;  Location: [facility];  Service: Ophthalmology;  Laterality: Right;   CDE: 4.06   CATARACT EXTRACTION W/PHACO Left 08/20/2018   Procedure: CATARACT EXTRACTION PHACO AND INTRAOCULAR LENS PLACEMENT (IOC);  Surgeon: Baruch Goldmann, MD;  Location: [facility];  Service: Ophthalmology;  Laterality: Left;  CDE: 6.76   COLONOSCOPY WITH PROPOFOL N/A 07/22/2015   Procedure: COLONOSCOPY WITH PROPOFOL;  Surgeon: Doran Stabler, MD;  Location: [facility];  Service: Gastroenterology;  Laterality: N/A;   FEMORAL-POPLITEAL BYPASS GRAFT Right 03/31/2016   Procedure: BYPASS GRAFT FEMORAL-POPLITEAL ARTERY USING RIGHT GREATER SAPHENOUS NONREVERSED VEIN;  Surgeon: Angelia Mould, MD;  Location: [place];  Service: Vascular;  Laterality: Right;   HERNIA REPAIR     I & D EXTREMITY Right 03/31/2016   Procedure: IRRIGATION AND DEBRIDEMENT FOOT;  Surgeon: Angelia Mould, MD;  Location: [place];  Service: Vascular;  Laterality: Right;   IMPLANTABLE CARDIOVERTER DEFIBRILLATOR IMPLANT N/A 06/27/2013   Procedure: SUB Q ICD;  Surgeon: Deboraha Sprang, MD;  Location: [facility];  Service: Cardiovascular;  Laterality: N/A;   INTRAOPERATIVE ARTERIOGRAM Right 03/31/2016   Procedure: INTRA OPERATIVE ARTERIOGRAM;  Surgeon: Angelia Mould, MD;  Location: [place];  Service: Vascular;  Laterality: Right;   IR GENERIC HISTORICAL Right 11/30/2015   IR THROMBECTOMY AV FISTULA W/THROMBOLYSIS/PTA INC/SHUNT/IMG RIGHT 11/30/2015 Aletta Edouard, MD [facility]   IR GENERIC HISTORICAL  11/30/2015   IR US GUIDE VASC ACCESS RIGHT 11/30/2015 Aletta Edouard, MD [facility]   IR GENERIC HISTORICAL Right 12/15/2015   IR THROMBECTOMY AV FISTULA W/THROMBOLYSIS/PTA/STENT INC/SHUNT/IMG RT 12/15/2015 Arne Cleveland, MD [facility]   IR GENERIC HISTORICAL  12/15/2015   IR US GUIDE VASC ACCESS RIGHT 12/15/2015 Arne Cleveland, MD [facility]   IR GENERIC HISTORICAL  12/28/2015   IR FLUORO GUIDE CV LINE RIGHT 12/28/2015 Marybelle Killings, MD [facility]   IR GENERIC HISTORICAL  12/28/2015   IR US GUIDE VASC ACCESS  RIGHT 12/28/2015 Marybelle Killings, MD [facility]   LEFT A ND RIGHT HEART CATH  01/30/2013   DR Sung Amabile   LEFT AND RIGHT HEART CATHETERIZATION WITH CORONARY ANGIOGRAM N/A 01/30/2013   Procedure: LEFT AND RIGHT HEART CATHETERIZATION WITH CORONARY ANGIOGRAM;  Surgeon: Jolaine Artist, MD;  Location: [facility] C[facility] LAB;  Service: Cardiovascular;  Laterality: N/A;   PERIPHERAL VASCULAR CATHETERIZATION Right 01/26/2015   Procedure: A/V Fistulagram;  Surgeon: Angelia Mould, MD;  Location: [place] [facility];  Service: Cardiovascular;  Laterality: Right;   reapea urethral surgery for recurrent obstruction  2011   TOTAL KNEE ARTHROPLASTY Right 2007   VEIN HARVEST Right 03/31/2016   Procedure: RIGHT GREATER SAPHENOUS VEIN HARVEST;  Surgeon: Angelia Mould, MD;  Location: [place];  Service: Vascular;  Laterality: Right;    There were no vitals filed for this visit.   Subjective Assessment - 06/24/20 1344  Subjective No changes.    Patient is accompained by: Family member   wife, Shayn Madole   Pertinent History Left TTA, automatic cardioverter/defibrillator, arthritis, CAD, cardiomyopathy, CHF, COPD, DM2, neuropathy, ESRD, CAD w/catherization, HTN, kidney stones, right TKA 2007    Patient Stated Goals Walk in home & community.    Currently in Pain? No/denies                               [facility] Adult PT Treatment/Exercise - 06/24/20 1343       Transfers   Sit to Stand 5: Supervision;With upper extremity assist;With armrests;From chair/3-in-1;Other (comment)   to RW   Sit to Stand Details Visual cues for safe use of DME/AE;Verbal cues for sequencing;Verbal cues for technique    Stand to Sit 5: Supervision;With upper extremity assist;With armrests;To chair/3-in-1;Other (comment)   from RW   Stand to Sit Details (indicate cue type and reason) Visual cues for safe use of DME/AE;Verbal cues for sequencing;Verbal cues for technique      Ambulation/Gait   Ambulation/Gait  Yes    Ambulation/Gait Assistance 5: Supervision;4: Min guard   Min guard cane stand alone tip & supervision RW   Ambulation/Gait Assistance Details tactile & verbal cues on balance reactions,  worked on scanning right/left & up/down with cane with min guard / tactile cues for reactions.    Ambulation Distance (Feet) 200 Feet   200' cane & 75' no device   Assistive device Prostheses;Straight cane;None   cane stand alone tip   Ramp 3: Mod assist    Ramp Details (indicate cue type and reason) PT demo, verbal & tactile cues on technique with prostheses.  PT instructed in sidestepping up/down if too steep.  ModA    Curb 3: Mod assist   cane & bil. TTA prostheses   Curb Details (indicate cue type and reason) PT demo & verbal cues on need to maintain motion for momentum.    Gait Comments gait inside //bars wthout UE support with supervion 7' X 3      High Level Balance   High Level Balance Activities Backward walking;Side stepping;Turns   inslde //bars without UE support 7' X 3 laps ea.   High Level Balance Comments PT demo, verbal, mirror for visual & tactile cues on technique      Neuro Re-ed    Neuro Re-ed Details  --      Exercises   Exercises Other Exercises;Knee/Hip      Knee/Hip Exercises: Stretches   Active Hamstring Stretch Right;Left;2 reps;30 seconds    Active Hamstring Stretch Limitations supine with strap DF      Knee/Hip Exercises: Machines for Strengthening   Total Gym Leg Press shuttle leg press 125# 15 reps 1st set back 45* & 2nd set back flat had to decrease wt today to 100#  single leg stance on ea LE by lifting other LE off plate for ~1BPZ.      Knee/Hip Exercises: Standing   Hip Flexion --    Hip Flexion Limitations --    Hip ADduction --    Hip ADduction Limitations --    Hip Abduction --    Abduction Limitations --    Hip Extension --    Extension Limitations --      Knee/Hip Exercises: Seated   Sit to Sand 1 set;10 reps;without UE support   from 24" bar stool      Prosthetics   Prosthetic Care  Comments  --    Current prosthetic wear tolerance (days/week)  daily    Current prosthetic wear tolerance (#hours/day)  most of awake hours    Edema pitting flucutating with dialysis    Residual limb condition  wound healed but thin white epitheal covering so continue Tegaderm for at least 1 week for protection.    Education Provided --                      PT Short Term Goals - 06/24/20 1427       PT SHORT TERM GOAL #1   Title Patient verbalizes proper sweat management with prostheses.    Time 1    Period Months    Status Achieved    Target Date 06/26/20      PT SHORT TERM GOAL #2   Title Patient tolerates bilateral prostheses >90% awake hrs total /day without skin issues    Time 1    Period Months    Status Achieved    Target Date 06/26/20      PT SHORT TERM GOAL #3   Title Patient able to reach 10" without UE support safely    Baseline met 6/3, 11 inch reach    Time 1    Period Months    Status Achieved    Target Date 06/26/20      PT SHORT TERM GOAL #4   Title Patient ambulates 100' with cane & prostheses including sit / stand chair with armrests with supervision    Baseline 150 feet with supervision on 6/3    Time 1    Period Months    Status Achieved    Target Date 06/26/20      PT SHORT TERM GOAL #5   Title Patient negotiates ramps & curbs with cane & prostheses with minA.    Baseline min A for ramp, min A to descend curb but sill needs mod A to ascend curb    Time 1    Period Months    Status Partially Met    Target Date 06/26/20               PT Long Term Goals - 04/29/20 1638       PT LONG TERM GOAL #1   Title Patient demonstrates & verbalized understanding of prosthetic care to enable safe utilization of prostheses.    Time 12    Period Weeks    Status New    Target Date 07/23/20      PT LONG TERM GOAL #2   Title Patient tolerates prostheses wear for >90% of awake hours without skin issues     Time 12    Period Weeks    Status New    Target Date 07/23/20      PT LONG TERM GOAL #3   Title Berg Balance >/= 36/56 to indicate lower fall risk    Time 12    Period Weeks    Status New    Target Date 07/23/20      PT LONG TERM GOAL #4   Title Patient ambulates 300' with LRAD & prostheses modified independent    Time 12    Period Weeks    Status New    Target Date 07/23/20      PT LONG TERM GOAL #5   Title Patient negotiates ramps, curbs & stairs with LRAD & prostheses modified independent.    Time 12    Period Weeks  Status New    Target Date 07/23/20                   Plan - 06/24/20 1343     Clinical Impression Statement Patient met or partially met all STGs for this 30 days.  PT working on prosthetic gait with cane for community gait & no device for houshehold.  He is still challenged by ramps & curbs with cane & bil. TTA prostheses but improving.  Patient's strength & LE flexibility also seem to be improving.    Personal Factors and Comorbidities Comorbidity 3+;Fitness;Time since onset of injury/illness/exacerbation    Comorbidities Left TTA, automatic cardioverter/defibrillator, arthritis, CAD, cardiomyopathy, CHF, COPD, DM2, neuropathy, ESRD, CAD w/catherization, HTN, kidney stones, right TKA 2007    Examination-Activity Limitations Lift;Locomotion Level;Squat;Stairs;Stand;Transfers    Examination-Participation Restrictions Driving;Community Activity    Stability/Clinical Decision Making Evolving/Moderate complexity    Rehab Potential Good    PT Frequency 2x / week    PT Duration 12 weeks    PT Treatment/Interventions ADLs/Self Care Home Management;DME Instruction;Gait training;Stair training;Functional mobility training;Therapeutic activities;Therapeutic exercise;Balance training;Neuromuscular re-education;Patient/family education;Prosthetic Training;Vestibular    PT Next Visit Plan work towards [place],  review prosthetic care, prosthetic gait with cane  including ramps & curbs, therapeutic exercise to increase functional strength & endurance.    Consulted and Agree with Plan of Care Patient             Patient will benefit from skilled therapeutic intervention in order to improve the following deficits and impairments:  Abnormal gait, Decreased activity tolerance, Decreased balance, Decreased endurance, Decreased knowledge of use of DME, Decreased mobility, Decreased skin integrity, Decreased strength, Increased edema, Impaired flexibility, Postural dysfunction, Prosthetic Dependency, Pain  Visit Diagnosis: Other abnormalities of gait and mobility  Muscle weakness (generalized)  Unsteadiness on feet     Problem List Patient Active Problem List   Diagnosis Date Noted   Malnutrition of moderate degree 11/18/2019   Below-knee amputation of left lower extremity ([place]) 11/16/2019   Abscess of left foot 11/13/2019   Dehiscence of amputation stump ([place])    History of complete ray amputation of fifth toe of left foot ([place]) 10/09/2019   Acute pulmonary edema ([facility])    Osteomyelitis of fifth toe of left foot ([place])    Febrile illness 08/17/2019   SIRS (systemic inflammatory response syndrome) ([place]) 08/17/2019   Thrombocytopenia ([place]) 08/17/2019   Pressure injury of back, stage 2 ([place]) 08/17/2019   Hypertensive emergency 07/10/2019   Onychomycosis 08/16/2016   Osteopathy in diseases classified elsewhere, unspecified site 05/14/2016   Status post unilateral below knee amputation, right ([place]) 04/26/2016   Bilateral carotid bruits 03/30/2016   Diabetes mellitus with complication ([place])    Anemia due to chronic kidney disease    ESRD on dialysis ([place]) 11/30/2015   Acute respiratory failure with hypoxia ([place]) 11/29/2015   Dependence on renal dialysis ([place]) 08/15/2015   Presence of automatic (implantable) cardiac defibrillator 03/12/2015   At high risk for falls 09/29/2014   Peripheral vascular disease ([place]) 09/29/2014   Osteoarthritis of  both knees 01/07/2014   Osteoarthritis of multiple joints 10/06/2013   NICM (nonischemic cardiomyopathy) ([place]) 06/27/2013   CAD (coronary artery disease) 03/13/2013   Abnormal nuclear stress test 52/84/1324   Chronic systolic congestive heart failure ([place]) 01/27/2013   COPD (chronic obstructive pulmonary disease) ([place]) 12/27/2012   Diabetic peripheral neuropathy ([place]) 02/10/2012   Urethral stricture 10/06/2010   URINARY CALCULUS 09/28/2009   Adjustment disorder with depressed mood 08/21/2009  Poorly controlled type II diabetes mellitus with renal complication ([place]) 48/27/0786   Hyperlipidemia 04/14/2008   Essential hypertension 04/14/2008   Allergic rhinitis 04/14/2008    Jamey Reas, PT, DPT 06/24/2020, 2:31 PM  [facility] [address] [place], [place], [address] Phone: [phone]   Fax:  [phone]  Name: RUSS LOOPER MRN: [id number] Date of Birth: 18-May-1960

## 2020-06-25 DIAGNOSIS — D631 Anemia in chronic kidney disease: Secondary | ICD-10-CM | POA: Diagnosis not present

## 2020-06-25 DIAGNOSIS — N2581 Secondary hyperparathyroidism of renal origin: Secondary | ICD-10-CM | POA: Diagnosis not present

## 2020-06-25 DIAGNOSIS — Z992 Dependence on renal dialysis: Secondary | ICD-10-CM | POA: Diagnosis not present

## 2020-06-25 DIAGNOSIS — E209 Hypoparathyroidism, unspecified: Secondary | ICD-10-CM | POA: Diagnosis not present

## 2020-06-25 DIAGNOSIS — N186 End stage renal disease: Secondary | ICD-10-CM | POA: Diagnosis not present

## 2020-06-25 DIAGNOSIS — D509 Iron deficiency anemia, unspecified: Secondary | ICD-10-CM | POA: Diagnosis not present

## 2020-06-26 ENCOUNTER — Other Ambulatory Visit: Payer: Self-pay

## 2020-06-26 ENCOUNTER — Ambulatory Visit (INDEPENDENT_AMBULATORY_CARE_PROVIDER_SITE_OTHER): Payer: Medicare Other | Admitting: Family Medicine

## 2020-06-26 VITALS — BP 150/60 | HR 96 | Temp 98.0°F | Wt 189.3 lb

## 2020-06-26 DIAGNOSIS — I2510 Atherosclerotic heart disease of native coronary artery without angina pectoris: Secondary | ICD-10-CM | POA: Diagnosis not present

## 2020-06-26 DIAGNOSIS — L299 Pruritus, unspecified: Secondary | ICD-10-CM

## 2020-06-26 DIAGNOSIS — I5022 Chronic systolic (congestive) heart failure: Secondary | ICD-10-CM | POA: Diagnosis not present

## 2020-06-26 DIAGNOSIS — E785 Hyperlipidemia, unspecified: Secondary | ICD-10-CM

## 2020-06-26 MED ORDER — FENOFIBRATE 160 MG PO TABS: 160.0000 mg | ORAL_TABLET | Freq: Every day | ORAL | 3 refills | Status: DC

## 2020-06-26 MED ORDER — CARVEDILOL 25 MG PO TABS: 25.0000 mg | ORAL_TABLET | Freq: Two times a day (BID) | ORAL | 3 refills | Status: DC

## 2020-06-26 MED ORDER — FUROSEMIDE 40 MG PO TABS: 120.0000 mg | ORAL_TABLET | Freq: Every day | ORAL | 0 refills | Status: DC

## 2020-06-26 MED ORDER — ATORVASTATIN CALCIUM 80 MG PO TABS: 80.0000 mg | ORAL_TABLET | Freq: Every day | ORAL | 3 refills | Status: DC

## 2020-06-26 MED ORDER — RENA-VITE PO TABS: 1.0000 | ORAL_TABLET | Freq: Every day | ORAL | 3 refills | Status: DC

## 2020-06-26 MED ORDER — ONDANSETRON 8 MG PO TBDP: 8.0000 mg | ORAL_TABLET | Freq: Three times a day (TID) | ORAL | 1 refills | Status: DC | PRN

## 2020-06-26 NOTE — Progress Notes (Signed)
Established Patient Office Visit  Subjective:  Patient ID: Anthony Bullock, male    DOB: November 30, 1960  Age: 60 y.o. MRN: [id number]  CC:  Chief Complaint  Patient presents with   Rash    Full body itching, x 1 month, ho changes in house hold items, no visible rash    HPI Anthony Bullock presents for medical follow-up.  He has multiple chronic problems including history of CAD, chronic systolic heart failure, hypertension, type 2 diabetes, end-stage renal disease on hemodialysis, peripheral vascular disease, COPD.  He unfortunately had to have his other leg amputated within the past year.  He is getting around amazingly well on prostheses at this time bilaterally.  Overall doing fairly well.  His major complaint today is what he describes as "full body itching ".  Other than a couple small papules no clear rash.  He wonders if there may be something internal causing this.  No new medications. No change of soap or detergent.  He notices interesting correlation that his itching started after he quit taking gabapentin.  He had been on gabapentin for years for neuropathic pain.  Diabetes for several years uncontrolled.  He does have continuous monitoring system currently and states his last A1c was 6.8%.  His 30-day average blood sugars been around 130.  Overall he is very pleased with his blood sugars.  He is requesting several refills today including atorvastatin, furosemide, fenofibrate, carvedilol.  Overdue for follow-up lipids.  Past Medical History:  Diagnosis Date   AICD (automatic cardioverter/defibrillator) present    [employer]   Allergic rhinitis    Anemia    Arthritis    Chronic systolic heart failure ([place])    a. ECHO (12/2012) EF 25-30%, HK entireanteroseptal myocardium //  b.  EF 25%, diffuse HK, grade 1 diastolic dysfunction, MAC, mild LAE, normal RVSF, trivial pericardial effusion   COPD (chronic obstructive pulmonary disease) ([place])    Diabetes mellitus type II    Diabetic  nephropathy ([place])    Diabetic neuropathy ([place])    ESRD on hemodialysis ([place])    started HD June 2017, goes to [facility] HD unit, Dr Hinda Lenis   History of cardiac catheterization    a.Myoview 1/15:  There is significant left ventricular dysfunction. There may be slight scar at the apex. There is no significant ischemia. LV Ejection Fraction: 27%  //  b. RHC/LHC (1/15) with mean RA 6, PA 47/22 mean 33, mean PCWP 20, PVR 2.5 WU, CI 2.5; 80% dLAD stenosis, 70% diffuse large D.     History of kidney stones    Hyperlipidemia    Hypertension    Kidney stones    NICM (nonischemic cardiomyopathy) ([place])    Primarily nonischemic.  Echo (12/14) with EF 25-30%.  Echo (3/15) with EF 25%, mild to moderately dilated LV, normal RV size and systolic function.     Osteomyelitis ([place])    left fifth ray   Pneumonia    Urethral stricture    Wears glasses     Past Surgical History:  Procedure Laterality Date   ABDOMINAL AORTOGRAM W/LOWER EXTREMITY N/A 03/30/2016   Procedure: Abdominal Aortogram w/Lower Extremity;  Surgeon: Angelia Mould, MD;  Location: [place] [facility];  Service: Cardiovascular;  Laterality: N/A;   AMPUTATION Right 04/26/2016   Procedure: Right Below Knee Amputation;  Surgeon: Newt Minion, MD;  Location: [place];  Service: Orthopedics;  Laterality: Right;   AMPUTATION Left 08/21/2019   Procedure: LEFT FOOT 5TH RAY  AMPUTATION;  Surgeon: Newt Minion, MD;  Location: [place];  Service: Orthopedics;  Laterality: Left;   AMPUTATION Left 11/13/2019   Procedure: LEFT BELOW KNEE AMPUTATION;  Surgeon: Newt Minion, MD;  Location: [place];  Service: Orthopedics;  Laterality: Left;   AV FISTULA PLACEMENT Right 09/08/2015   Procedure: INSERTION OF 4-41m x 45cm  ARTERIOVENOUS (AV) GORE-TEX GRAFT RIGHT UPPER  ARM;  Surgeon: CAngelia Mould MD;  [place]  Service: Vascular;  Laterality: Right;   AV FISTULA PLACEMENT Left 01/14/2016   Procedure: CREATION OF LEFT UPPER ARM ARTERIOVENOUS  FISTULA;  Surgeon: CAngelia Mould MD;  [place]  Service: Vascular;  Laterality: Left;   [place] 08/22/2014   Procedure: RIGHT UPPER ARM [place]  Surgeon: CAngelia Mould MD;  [place]  Service: Vascular;  Laterality: Right;   BELOW KNEE LEG AMPUTATION Right 04/26/2016   CARDIAC CATHETERIZATION     CARDIAC DEFIBRILLATOR PLACEMENT  06/27/2013   Sub Q       BY DR KCaryl Comes  CATARACT EXTRACTION W/PHACO Right 08/06/2018   Procedure: CATARACT EXTRACTION PHACO AND INTRAOCULAR LENS PLACEMENT ([place];  Surgeon: WBaruch Goldmann MD;  [facility];  Service: Ophthalmology;  Laterality: Right;  CDE: 4.06   CATARACT EXTRACTION W/PHACO Left 08/20/2018   Procedure: CATARACT EXTRACTION PHACO AND INTRAOCULAR LENS PLACEMENT (IOC);  Surgeon: WBaruch Goldmann MD;  Location: [facility];  Service: Ophthalmology;  Laterality: Left;  CDE: 6.76   COLONOSCOPY WITH PROPOFOL N/A 07/22/2015   Procedure: COLONOSCOPY WITH PROPOFOL;  Surgeon: HDoran Stabler MD;  [facility];  Service: Gastroenterology;  Laterality: N/A;   FEMORAL-POPLITEAL BYPASS GRAFT Right 03/31/2016   Procedure: BYPASS GRAFT FEMORAL-POPLITEAL ARTERY USING RIGHT GREATER SAPHENOUS NONREVERSED VEIN;  Surgeon: CAngelia Mould MD;  [place]  Service: Vascular;  Laterality: Right;   HERNIA REPAIR     I & D EXTREMITY Right 03/31/2016   Procedure: IRRIGATION AND DEBRIDEMENT FOOT;  Surgeon: CAngelia Mould MD;  [place]  Service: Vascular;  Laterality: Right;   IMPLANTABLE CARDIOVERTER DEFIBRILLATOR IMPLANT N/A 06/27/2013   Procedure: SUB Q ICD;  Surgeon: SDeboraha Sprang MD;  [facility];  Service: Cardiovascular;  Laterality: N/A;   INTRAOPERATIVE ARTERIOGRAM Right 03/31/2016   Procedure: INTRA OPERATIVE ARTERIOGRAM;  Surgeon: CAngelia Mould MD;  [place] [facility]  Service: Vascular;  Laterality: Right;   IR GENERIC HISTORICAL Right  11/30/2015   IR THROMBECTOMY AV FISTULA W/THROMBOLYSIS/PTA INC/SHUNT/IMG RIGHT 11/30/2015 GAletta Edouard MD [facility]   IR GENERIC HISTORICAL  11/30/2015   IR [place] ACCESS RIGHT 11/30/2015 GAletta Edouard MD [facility]   IR GENERIC HISTORICAL Right 12/15/2015   IR THROMBECTOMY AV FISTULA W/THROMBOLYSIS/PTA/STENT INC/SHUNT/IMG RT 12/15/2015 DArne Cleveland MD [facility]   IR GENERIC HISTORICAL  12/15/2015   IR [place] VASC ACCESS RIGHT 12/15/2015 DArne Cleveland MD [facility]   IR GENERIC HISTORICAL  12/28/2015   IR FLUORO GUIDE CV LINE RIGHT 12/28/2015 AMarybelle Killings MD [facility]   IR GENERIC HISTORICAL  12/28/2015   IR [place] VASC ACCESS RIGHT 12/28/2015 AMarybelle Killings MD [facility]   LEFT A ND RIGHT HEART CATH  01/30/2013   DR BSung Amabile  LEFT AND RIGHT HEART CATHETERIZATION WITH CORONARY ANGIOGRAM N/A 01/30/2013   Procedure: LEFT AND RIGHT HEART CATHETERIZATION WITH CORONARY ANGIOGRAM;  Surgeon: DJolaine Artist MD;  [facility];  Service: Cardiovascular;  Laterality: N/A;   PERIPHERAL VASCULAR CATHETERIZATION Right 01/26/2015   Procedure: A/V Fistulagram;  Surgeon: Angelia Mould, MD;  Location: [place] [facility];  Service: Cardiovascular;  Laterality: Right;   reapea urethral surgery for recurrent obstruction  2011   TOTAL KNEE ARTHROPLASTY Right 2007   VEIN HARVEST Right 03/31/2016   Procedure: RIGHT GREATER SAPHENOUS VEIN HARVEST;  Surgeon: Angelia Mould, MD;  [facility];  Service: Vascular;  Laterality: Right;    Family History  Problem Relation Age of Onset   Bladder Cancer Mother    Alcohol abuse Father    Melanoma Father    Stroke Maternal Grandmother    Heart Problems Maternal Grandmother        unknown   Diabetes Maternal Grandmother    Heart disease Maternal Grandfather    Prostate cancer Maternal Grandfather     Social History   Socioeconomic History   Marital status: Married    Spouse name: Not on file    Number of children: 0   Years of education: Not on file   Highest education level: Not on file  Occupational History   Not on file  Tobacco Use   Smoking status: Former    Packs/day: 2.00    Years: 32.00    Pack years: 64.00    Types: Cigarettes    Quit date: 05/11/2010    Years since quitting: 10.1   Smokeless tobacco: Never  Vaping Use   Vaping Use: Never used  Substance and Sexual Activity   Alcohol use: No   Drug use: No   Sexual activity: Yes  Other Topics Concern   Not on file  Social History Narrative   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   Social Determinants of Radio broadcast assistant Strain: Low Risk    Difficulty of Paying Living Expenses: Not hard at all  Food Insecurity: No Food Insecurity   Worried About Charity fundraiser in the Last Year: Never true   Arboriculturist in the Last Year: Never true  Transportation Needs: No Transportation Needs   Lack of Transportation (Medical): No   Lack of Transportation (Non-Medical): No  Physical Activity: Inactive   Days of Exercise per Week: 0 days   Minutes of Exercise per Session: 0 min  Stress: No Stress Concern Present   Feeling of Stress : Not at all  Social Connections: Moderately Integrated   Frequency of Communication with Friends and Family: More than three times a week   Frequency of Social Gatherings with Friends and Family: Twice a week   Attends Religious Services: More than 4 times per year   Active Member of [employer] or Organizations: No   Attends Music therapist: Never   Marital Status: Married  Human resources officer Violence: Not At Risk   Fear of Current or Ex-Partner: No   Emotionally Abused: No   Physically Abused: No   Sexually Abused: No    Outpatient Medications Prior to Visit  Medication Sig Dispense Refill   acetaminophen (TYLENOL) 325 MG tablet Take 1-2 tablets (325-650 mg total) by mouth every 4 (four) hours as needed for mild pain.     albuterol (VENTOLIN HFA) 108 (90 Base)  MCG/ACT inhaler Inhale 2 puffs into the lungs every 4 (four) hours as needed for wheezing or shortness of breath. 1 each 1   aspirin EC 81 MG tablet Take 81 mg by mouth daily.      azelastine (ASTELIN) 0.1 %  nasal spray Place 2 sprays into both nostrils 3 (three) times daily as needed for rhinitis. Use in each nostril as directed 30 mL 12   cinacalcet (SENSIPAR) 30 MG tablet Take 1 tablet (30 mg total) by mouth Every Tuesday,Thursday,and Saturday with dialysis. 60 tablet    Continuous Blood Gluc Sensor (FREESTYLE LIBRE 14 DAY SENSOR) MISC USE AS DIRECTED EVERY 14 DAYS 4 each 11   doxercalciferol (HECTOROL) 4 MCG/2ML injection Inject 1.25 mLs (2.5 mcg total) into the vein Every Tuesday,Thursday,and Saturday with dialysis. 2 mL    fexofenadine (ALLEGRA) 180 MG tablet Take 180 mg by mouth daily.     gabapentin (NEURONTIN) 100 MG capsule Take one pill in morning and two pills at bedtime. 90 capsule 0   glucose blood test strip Check 1 time daily. E11.9 One Touch Ultra Blue Test Strips 100 each 3   HUMALOG KWIKPEN 200 UNIT/ML KwikPen USE TWICE DAILY WITH LUNCH AND SUPPER PER SLIDING SCALE (MAXIMUM DAILY DOSE 28 UNITS) 6 mL 0   insulin glargine (LANTUS SOLOSTAR) 100 UNIT/ML Solostar Pen Inject 12 units subcutaneously at bed time. 15 mL 2   Insulin Pen Needle (BD PEN NEEDLE NANO U/F) 32G X 4 MM MISC USE 1 PEN NEEDLE SUBCUTANEOUSLY WITH INSULIN 4 TIMES DAILY 400 each 0   lanthanum (FOSRENOL) 500 MG chewable tablet Chew 500-1,000 mg by mouth See admin instructions. Take 2000 mg with meals three time a day and 500 mg with snacks     Olopatadine HCl 0.2 % SOLN Place 1 drop into both eyes daily as needed (for allergies).      atorvastatin (LIPITOR) 80 MG tablet Take 1 tablet (80 mg total) by mouth at bedtime. 90 tablet 3   fenofibrate 160 MG tablet Take 1 tablet (160 mg total) by mouth daily. 90 tablet 3   furosemide (LASIX) 40 MG tablet Take 3 tablets by mouth once daily (Patient taking differently: Take 120 mg  by mouth daily.) 270 tablet 0   multivitamin (RENA-VIT) TABS tablet Take 1 tablet by mouth once daily 90 tablet 0   carvedilol (COREG) 25 MG tablet Take 1 tablet (25 mg total) by mouth 2 (two) times daily. 60 tablet 0   No facility-administered medications prior to visit.    Allergies  Allergen Reactions   Epoetin Alfa Other (See Comments)    unknown   Ferumoxytol Other (See Comments)    unknown   Morphine Sulfate Rash and Other (See Comments)    Itches all over, red spots    ROS Review of Systems  Constitutional:  Negative for chills, fatigue and fever.  Eyes:  Negative for visual disturbance.  Respiratory:  Negative for cough, chest tightness and shortness of breath.   Cardiovascular:  Negative for chest pain, palpitations and leg swelling.  Neurological:  Negative for dizziness, syncope, weakness, light-headedness and headaches.     Objective:    Physical Exam Vitals reviewed.  Cardiovascular:     Rate and Rhythm: Normal rate and regular rhythm.  Pulmonary:     Effort: Pulmonary effort is normal.     Breath sounds: Normal breath sounds.  Musculoskeletal:     Cervical back: Neck supple.  Skin:    Comments: No real rash other than a couple of small nonspecific erythematous papules upper back and scalp region.  No pustules.  Neurological:     Mental Status: He is alert.    BP (!) 150/60 (BP Location: Left Arm, Patient Position: Sitting, Cuff Size: Normal)   Pulse  96   Temp 98 F (36.7 C) (Oral)   Wt 189 lb 4.8 oz (85.9 kg)   SpO2 99%   BMI 27.16 kg/m  Wt Readings from Last 3 Encounters:  06/26/20 189 lb 4.8 oz (85.9 kg)  03/17/20 174 lb (78.9 kg)  02/14/20 175 lb (79.4 kg)     Health Maintenance Due  Topic Date Due   Pneumococcal Vaccine 84-52 Years old (1 - PCV) Never done   Zoster Vaccines- Shingrix (1 of 2) Never done   TETANUS/TDAP  10/04/2018   COVID-19 Vaccine (2 - Moderna risk series) 05/16/2019   FOOT EXAM  03/19/2020   HEMOGLOBIN A1C   05/12/2020    There are no preventive care reminders to display for this patient.  Lab Results  Component Value Date   TSH 0.751 09/04/2019   Lab Results  Component Value Date   WBC 8.0 11/28/2019   HGB 10.0 (L) 11/28/2019   HCT 32.1 (L) 11/28/2019   MCV 92.5 11/28/2019   PLT 148 (L) 11/28/2019   Lab Results  Component Value Date   NA 134 (L) 11/28/2019   K 5.6 (H) 11/28/2019   CO2 26 11/28/2019   GLUCOSE 139 (H) 11/28/2019   BUN 63 (H) 11/28/2019   CREATININE 10.21 (H) 11/28/2019   BILITOT 0.6 09/04/2019   ALKPHOS 50 09/04/2019   AST 18 09/04/2019   ALT 14 09/04/2019   PROT 6.8 09/04/2019   ALBUMIN 2.8 (L) 11/28/2019   CALCIUM 9.2 11/28/2019   ANIONGAP 12 11/28/2019   GFR 12.30 (LL) 06/27/2014   Lab Results  Component Value Date   CHOL 158 05/08/2019   Lab Results  Component Value Date   HDL 37.00 (L) 05/08/2019   Lab Results  Component Value Date   LDLCALC 84 05/08/2019   Lab Results  Component Value Date   TRIG 187.0 (H) 05/08/2019   Lab Results  Component Value Date   CHOLHDL 4 05/08/2019   Lab Results  Component Value Date   HGBA1C 6.5 (H) 11/13/2019      Assessment & Plan:   #1 type 2 diabetes well controlled by recent A1c of 6.8%.  He is in getting this monitored regularly through nephrology currently.  Continue current insulin regimen  #2 generalized pruritus.  No visible rash.  Etiology unclear. -Check some labs with hepatic function and TSH -Consider trial of getting a back on gabapentin as his itching seem to start after stopping the gabapentin.  #3 dyslipidemia.  Goal LDL less than 70.  Patient on atorvastatin  -Refill atorvastatin for 1 year -Recheck lipid and hepatic panel today  #4 history of systolic heart failure.  Patient symptomatically stable. -Refill carvedilol   Meds ordered this encounter  Medications   ondansetron (ZOFRAN ODT) 8 MG disintegrating tablet    Sig: Take 1 tablet (8 mg total) by mouth every 8 (eight)  hours as needed for nausea or vomiting.    Dispense:  20 tablet    Refill:  1   carvedilol (COREG) 25 MG tablet    Sig: Take 1 tablet (25 mg total) by mouth 2 (two) times daily.    Dispense:  180 tablet    Refill:  3    10/07/2019 dose increase   fenofibrate 160 MG tablet    Sig: Take 1 tablet (160 mg total) by mouth daily.    Dispense:  90 tablet    Refill:  3   atorvastatin (LIPITOR) 80 MG tablet    Sig: Take 1  tablet (80 mg total) by mouth at bedtime.    Dispense:  90 tablet    Refill:  3   multivitamin (RENA-VIT) TABS tablet    Sig: Take 1 tablet by mouth daily.    Dispense:  90 tablet    Refill:  3   furosemide (LASIX) 40 MG tablet    Sig: Take 3 tablets (120 mg total) by mouth daily.    Dispense:  270 tablet    Refill:  0    Follow-up: Return in about 6 months (around 12/26/2020).    Carolann Littler, MD

## 2020-06-27 DIAGNOSIS — N186 End stage renal disease: Secondary | ICD-10-CM | POA: Diagnosis not present

## 2020-06-27 DIAGNOSIS — Z992 Dependence on renal dialysis: Secondary | ICD-10-CM | POA: Diagnosis not present

## 2020-06-27 DIAGNOSIS — N2581 Secondary hyperparathyroidism of renal origin: Secondary | ICD-10-CM | POA: Diagnosis not present

## 2020-06-27 DIAGNOSIS — D631 Anemia in chronic kidney disease: Secondary | ICD-10-CM | POA: Diagnosis not present

## 2020-06-27 DIAGNOSIS — D509 Iron deficiency anemia, unspecified: Secondary | ICD-10-CM | POA: Diagnosis not present

## 2020-06-27 DIAGNOSIS — E209 Hypoparathyroidism, unspecified: Secondary | ICD-10-CM | POA: Diagnosis not present

## 2020-06-27 LAB — LIPID PANEL
Cholesterol: 101 mg/dL (ref <200)
HDL: 37 mg/dL — ABNORMAL LOW (ref >=40)
LDL Cholesterol (Calc): 42 mg/dL (calc)
Non-HDL Cholesterol (Calc): 64 mg/dL (calc) (ref <130)
Total CHOL/HDL Ratio: 2.7 (calc) (ref <5.0)
Triglycerides: 140 mg/dL (ref <150)

## 2020-06-27 LAB — HEPATIC FUNCTION PANEL
AG Ratio: 1.3 (calc) (ref 1.0–2.5)
ALT: 19 U/L (ref 9–46)
AST: 30 U/L (ref 10–35)
Albumin: 3.5 g/dL — ABNORMAL LOW (ref 3.6–5.1)
Alkaline phosphatase (APISO): 75 U/L (ref 35–144)
Bilirubin, Direct: 0.1 mg/dL (ref 0.0–0.2)
Globulin: 2.6 g/dL (calc) (ref 1.9–3.7)
Indirect Bilirubin: 0.4 mg/dL (calc) (ref 0.2–1.2)
Total Bilirubin: 0.5 mg/dL (ref 0.2–1.2)
Total Protein: 6.1 g/dL (ref 6.1–8.1)

## 2020-06-27 LAB — TSH: TSH: 1.61 mIU/L (ref 0.40–4.50)

## 2020-06-27 LAB — SPECIMEN COMPROMISED

## 2020-06-29 ENCOUNTER — Other Ambulatory Visit: Payer: Self-pay

## 2020-06-29 ENCOUNTER — Ambulatory Visit (INDEPENDENT_AMBULATORY_CARE_PROVIDER_SITE_OTHER): Payer: Medicare Other | Admitting: Physical Therapy

## 2020-06-29 ENCOUNTER — Encounter: Payer: Self-pay | Admitting: Physical Therapy

## 2020-06-29 DIAGNOSIS — R2689 Other abnormalities of gait and mobility: Secondary | ICD-10-CM | POA: Diagnosis not present

## 2020-06-29 DIAGNOSIS — R2681 Unsteadiness on feet: Secondary | ICD-10-CM

## 2020-06-29 DIAGNOSIS — M6281 Muscle weakness (generalized): Secondary | ICD-10-CM | POA: Diagnosis not present

## 2020-06-29 NOTE — Therapy (Signed)
[facility] [address] [place], [place], [address] Phone: [phone]   Fax:  [phone]  Physical Therapy Treatment  Patient Details  Name: Anthony Bullock MRN: [id number] Date of Birth: 07/29/1960 Referring Provider (PT): Bevely Palmer Persons, [place]   Encounter Date: 06/29/2020   PT End of Session - 06/29/20 1304     Visit Number 15    Number of Visits 25    Date for PT Re-Evaluation 07/23/20    Authorization Type Medicare & BCBS supplement    PT Start Time 1300    PT Stop Time 1345    PT Time Calculation (min) 45 min    Equipment Utilized During Treatment Gait belt    Activity Tolerance Patient tolerated treatment well;Patient limited by fatigue    Behavior During Therapy WFL for tasks assessed/performed             Past Medical History:  Diagnosis Date   AICD (automatic cardioverter/defibrillator) present    [employer]   Allergic rhinitis    Anemia    Arthritis    Chronic systolic heart failure ([place])    a. ECHO (12/2012) EF 25-30%, HK entireanteroseptal myocardium //  b.  EF 25%, diffuse HK, grade 1 diastolic dysfunction, MAC, mild LAE, normal RVSF, trivial pericardial effusion   COPD (chronic obstructive pulmonary disease) ([place])    Diabetes mellitus type II    Diabetic nephropathy ([place])    Diabetic neuropathy ([place])    ESRD on hemodialysis ([place])    started HD June 2017, goes to [facility] HD unit, Dr Hinda Lenis   History of cardiac catheterization    a.Myoview 1/15:  There is significant left ventricular dysfunction. There may be slight scar at the apex. There is no significant ischemia. LV Ejection Fraction: 27%  //  b. RHC/LHC (1/15) with mean RA 6, PA 47/22 mean 33, mean PCWP 20, PVR 2.5 WU, CI 2.5; 80% dLAD stenosis, 70% diffuse large D.     History of kidney stones    Hyperlipidemia    Hypertension    Kidney stones    NICM (nonischemic cardiomyopathy) ([place])    Primarily nonischemic.  Echo (12/14) with EF 25-30%.   Echo (3/15) with EF 25%, mild to moderately dilated LV, normal RV size and systolic function.     Osteomyelitis ([place])    left fifth ray   Pneumonia    Urethral stricture    Wears glasses     Past Surgical History:  Procedure Laterality Date   ABDOMINAL AORTOGRAM W/LOWER EXTREMITY N/A 03/30/2016   Procedure: Abdominal Aortogram w/Lower Extremity;  Surgeon: Angelia Mould, MD;  Location: [place] [facility];  Service: Cardiovascular;  Laterality: N/A;   AMPUTATION Right 04/26/2016   Procedure: Right Below Knee Amputation;  Surgeon: Newt Minion, MD;  Location: [place];  Service: Orthopedics;  Laterality: Right;   AMPUTATION Left 08/21/2019   Procedure: LEFT FOOT 5TH RAY AMPUTATION;  Surgeon: Newt Minion, MD;  Location: [place];  Service: Orthopedics;  Laterality: Left;   AMPUTATION Left 11/13/2019   Procedure: LEFT BELOW KNEE AMPUTATION;  Surgeon: Newt Minion, MD;  Location: [place];  Service: Orthopedics;  Laterality: Left;   AV FISTULA PLACEMENT Right 09/08/2015   Procedure: INSERTION OF 4-20m x 45cm  ARTERIOVENOUS (AV) GORE-TEX GRAFT RIGHT UPPER  ARM;  Surgeon: CAngelia Mould MD;  Location[place]  Service: Vascular;  Laterality: Right;   AV FISTULA PLACEMENT Left 01/14/2016   Procedure: CREATION OF LEFT UPPER  ARM ARTERIOVENOUS FISTULA;  Surgeon: Angelia Mould, MD;  Location: [place];  Service: Vascular;  Laterality: Left;   [place] Right 08/22/2014   Procedure: RIGHT UPPER ARM [place];  Surgeon: Angelia Mould, MD;  Location: [place];  Service: Vascular;  Laterality: Right;   BELOW KNEE LEG AMPUTATION Right 04/26/2016   CARDIAC CATHETERIZATION     CARDIAC DEFIBRILLATOR PLACEMENT  06/27/2013   Sub Q       BY DR Caryl Comes   CATARACT EXTRACTION W/PHACO Right 08/06/2018   Procedure: CATARACT EXTRACTION PHACO AND INTRAOCULAR LENS PLACEMENT ([place]);  Surgeon: Baruch Goldmann, MD;  Location: [facility];  Service: Ophthalmology;  Laterality: Right;   CDE: 4.06   CATARACT EXTRACTION W/PHACO Left 08/20/2018   Procedure: CATARACT EXTRACTION PHACO AND INTRAOCULAR LENS PLACEMENT (IOC);  Surgeon: Baruch Goldmann, MD;  Location: [facility];  Service: Ophthalmology;  Laterality: Left;  CDE: 6.76   COLONOSCOPY WITH PROPOFOL N/A 07/22/2015   Procedure: COLONOSCOPY WITH PROPOFOL;  Surgeon: Doran Stabler, MD;  [facility]: [facility];  Service: Gastroenterology;  Laterality: N/A;   FEMORAL-POPLITEAL BYPASS GRAFT Right 03/31/2016   Procedure: BYPASS GRAFT FEMORAL-POPLITEAL ARTERY USING RIGHT GREATER SAPHENOUS NONREVERSED VEIN;  Surgeon: Angelia Mould, MD;  Location: [place];  Service: Vascular;  Laterality: Right;   HERNIA REPAIR     I & D EXTREMITY Right 03/31/2016   Procedure: IRRIGATION AND DEBRIDEMENT FOOT;  Surgeon: Angelia Mould, MD;  Location: [place];  Service: Vascular;  Laterality: Right;   IMPLANTABLE CARDIOVERTER DEFIBRILLATOR IMPLANT N/A 06/27/2013   Procedure: SUB Q ICD;  Surgeon: Deboraha Sprang, MD;  Location: [facility] C[facility];  Service: Cardiovascular;  Laterality: N/A;   INTRAOPERATIVE ARTERIOGRAM Right 03/31/2016   Procedure: INTRA OPERATIVE ARTERIOGRAM;  Surgeon: Angelia Mould, MD;  Location: [place];  Service: Vascular;  Laterality: Right;   IR GENERIC HISTORICAL Right 11/30/2015   IR THROMBECTOMY AV FISTULA W/THROMBOLYSIS/PTA INC/SHUNT/IMG RIGHT 11/30/2015 Aletta Edouard, MD [facility]   IR GENERIC HISTORICAL  11/30/2015   IR US GUIDE VASC ACCESS RIGHT 11/30/2015 Aletta Edouard, MD [facility]   IR GENERIC HISTORICAL Right 12/15/2015   IR THROMBECTOMY AV FISTULA W/THROMBOLYSIS/PTA/STENT INC/SHUNT/IMG RT 12/15/2015 Arne Cleveland, MD [facility]   IR GENERIC HISTORICAL  12/15/2015   IR US GUIDE VASC ACCESS RIGHT 12/15/2015 Arne Cleveland, MD [facility]   IR GENERIC HISTORICAL  12/28/2015   IR FLUORO GUIDE CV LINE RIGHT 12/28/2015 Marybelle Killings, MD [facility]   IR GENERIC HISTORICAL  12/28/2015   IR US GUIDE VASC ACCESS  RIGHT 12/28/2015 Marybelle Killings, MD [facility]   LEFT A ND RIGHT HEART CATH  01/30/2013   DR Sung Amabile   LEFT AND RIGHT HEART CATHETERIZATION WITH CORONARY ANGIOGRAM N/A 01/30/2013   Procedure: LEFT AND RIGHT HEART CATHETERIZATION WITH CORONARY ANGIOGRAM;  Surgeon: Jolaine Artist, MD;  Location: [facility];  Service: Cardiovascular;  Laterality: N/A;   PERIPHERAL VASCULAR CATHETERIZATION Right 01/26/2015   Procedure: A/V Fistulagram;  Surgeon: Angelia Mould, MD;  Location: [place] [facility];  Service: Cardiovascular;  Laterality: Right;   reapea urethral surgery for recurrent obstruction  2011   TOTAL KNEE ARTHROPLASTY Right 2007   VEIN HARVEST Right 03/31/2016   Procedure: RIGHT GREATER SAPHENOUS VEIN HARVEST;  Surgeon: Angelia Mould, MD;  Location: [place];  Service: Vascular;  Laterality: Right;    There were no vitals filed for this visit.   Subjective Assessment - 06/29/20 1300  Subjective He walked around his house without cane with prostheses some. He drove in parking lot a little.    Patient is accompained by: Family member   wife, Anthony Bullock   Pertinent History Left TTA, automatic cardioverter/defibrillator, arthritis, CAD, cardiomyopathy, CHF, COPD, DM2, neuropathy, ESRD, CAD w/catherization, HTN, kidney stones, right TKA 2007    Patient Stated Goals Walk in home & community.    Currently in Pain? No/denies                               [facility] Adult PT Treatment/Exercise - 06/29/20 1300       Transfers   Sit to Stand 5: Supervision;With upper extremity assist;With armrests;From chair/3-in-1;Other (comment)   to RW   Sit to Stand Details Visual cues for safe use of DME/AE;Verbal cues for sequencing;Verbal cues for technique    Stand to Sit 5: Supervision;With upper extremity assist;With armrests;To chair/3-in-1;Other (comment)   from RW   Stand to Sit Details (indicate cue type and reason) Visual cues for safe use of DME/AE;Verbal cues  for sequencing;Verbal cues for technique      Ambulation/Gait   Ambulation/Gait Yes    Ambulation/Gait Assistance 5: Supervision;4: Min guard   Min guard cane stand alone tip & supervision RW   Ambulation Distance (Feet) 300 Feet   300' cane & 75' no device   Assistive device Prostheses;Straight cane;None   cane stand alone tip   Ramp 4: Min assist   indoor ramp w/ cane & outdoor curb cut w/o device   Ramp Details (indicate cue type and reason) PT verbal & tactile cues on technique with prostheses..    Curb 3: Mod assist;4: Min assist   minA w/cane & modA w/o device & bil. TTA prostheses   Curb Details (indicate cue type and reason) PT demo & verbal cues on need to maintain motion for momentum.    Gait Comments gait inside //bars wthout UE support with supervion 7' X 3      High Level Balance   High Level Balance Activities Backward walking;Side stepping;Turns   inslde //bars without UE support 7' X 3 laps ea. & eyes closed   High Level Balance Comments PT demo, verbal, mirror for visual & tactile cues on technique      Exercises   Exercises Other Exercises;Knee/Hip      Knee/Hip Exercises: Stretches   Active Hamstring Stretch Right;Left;2 reps;30 seconds    Active Hamstring Stretch Limitations supine with strap DF      Knee/Hip Exercises: Machines for Strengthening   Total Gym Leg Press shuttle leg press 125# 15 reps 1st set back 45* & 2nd set back flat single leg stance on ea LE by lifting other LE off plate for ~9GEX.      Knee/Hip Exercises: Standing   Forward Step Up Right;Left;1 set;Hand Hold: 2;5 reps;Step Height: 8"   BOSU round side up   Forward Step Up Limitations demo, tactile & verbal cues on technique    Step Down Right;Left;1 set;Hand Hold: 2;5 reps;Step Height: 8"   BOSU round side up   Step Down Limitations demo, tactile & verbal cues on technique    Rocker Board 1 minute   ant/level/post & rt/leel/lt   Rocker Board Limitations verbal & tactile cues on technique       Knee/Hip Exercises: Seated   Sit to Sand 1 set;10 reps;without UE support   from 24" bar stool  Prosthetics   Prosthetic Care Comments  demo & verbal cues on rationale & use of cut off socks.    Current prosthetic wear tolerance (days/week)  daily    Current prosthetic wear tolerance (#hours/day)  most of awake hours    Edema pitting flucutating with dialysis    Residual limb condition  wound healed but thin white epitheal covering so continue Tegaderm for at least 1 week for protection.    Education Provided Correct ply sock adjustment;Other (comment)   see prosthetic care comments   Person(s) Educated Patient    Education Method Explanation;Demonstration;Tactile cues;Verbal cues    Education Method Verbalized understanding;Tactile cues required;Verbal cues required;Needs further instruction                      PT Short Term Goals - 06/24/20 1427       PT SHORT TERM GOAL #1   Title Patient verbalizes proper sweat management with prostheses.    Time 1    Period Months    Status Achieved    Target Date 06/26/20      PT SHORT TERM GOAL #2   Title Patient tolerates bilateral prostheses >90% awake hrs total /day without skin issues    Time 1    Period Months    Status Achieved    Target Date 06/26/20      PT SHORT TERM GOAL #3   Title Patient able to reach 10" without UE support safely    Baseline met 6/3, 11 inch reach    Time 1    Period Months    Status Achieved    Target Date 06/26/20      PT SHORT TERM GOAL #4   Title Patient ambulates 100' with cane & prostheses including sit / stand chair with armrests with supervision    Baseline 150 feet with supervision on 6/3    Time 1    Period Months    Status Achieved    Target Date 06/26/20      PT SHORT TERM GOAL #5   Title Patient negotiates ramps & curbs with cane & prostheses with minA.    Baseline min A for ramp, min A to descend curb but sill needs mod A to ascend curb    Time 1    Period Months     Status Partially Met    Target Date 06/26/20               PT Long Term Goals - 04/29/20 1638       PT LONG TERM GOAL #1   Title Patient demonstrates & verbalized understanding of prosthetic care to enable safe utilization of prostheses.    Time 12    Period Weeks    Status New    Target Date 07/23/20      PT LONG TERM GOAL #2   Title Patient tolerates prostheses wear for >90% of awake hours without skin issues    Time 12    Period Weeks    Status New    Target Date 07/23/20      PT LONG TERM GOAL #3   Title Berg Balance >/= 36/56 to indicate lower fall risk    Time 12    Period Weeks    Status New    Target Date 07/23/20      PT LONG TERM GOAL #4   Title Patient ambulates 300' with LRAD & prostheses modified independent    Time 12  Period Weeks    Status New    Target Date 07/23/20      PT LONG TERM GOAL #5   Title Patient negotiates ramps, curbs & stairs with LRAD & prostheses modified independent.    Time 12    Period Weeks    Status New    Target Date 07/23/20                   Plan - 06/29/20 1305     Clinical Impression Statement PT continues with balance activities to facilitate residual limb (ankle), hip & step strategies which are slowly improving.  Pt improved on negotiating curb cut incline with prsotheses only.  Pt would benefit from appt with prosthetist for alignment changes.    Personal Factors and Comorbidities Comorbidity 3+;Fitness;Time since onset of injury/illness/exacerbation    Comorbidities Left TTA, automatic cardioverter/defibrillator, arthritis, CAD, cardiomyopathy, CHF, COPD, DM2, neuropathy, ESRD, CAD w/catherization, HTN, kidney stones, right TKA 2007    Examination-Activity Limitations Lift;Locomotion Level;Squat;Stairs;Stand;Transfers    Examination-Participation Restrictions Driving;Community Activity    Stability/Clinical Decision Making Evolving/Moderate complexity    Rehab Potential Good    PT Frequency 2x /  week    PT Duration 12 weeks    PT Treatment/Interventions ADLs/Self Care Home Management;DME Instruction;Gait training;Stair training;Functional mobility training;Therapeutic activities;Therapeutic exercise;Balance training;Neuromuscular re-education;Patient/family education;Prosthetic Training;Vestibular    PT Next Visit Plan work towards [place],  review prosthetic care, prosthetic gait with cane including ramps & curbs, therapeutic exercise to increase functional strength & endurance.    Consulted and Agree with Plan of Care Patient             Patient will benefit from skilled therapeutic intervention in order to improve the following deficits and impairments:  Abnormal gait, Decreased activity tolerance, Decreased balance, Decreased endurance, Decreased knowledge of use of DME, Decreased mobility, Decreased skin integrity, Decreased strength, Increased edema, Impaired flexibility, Postural dysfunction, Prosthetic Dependency, Pain  Visit Diagnosis: Other abnormalities of gait and mobility  Muscle weakness (generalized)  Unsteadiness on feet     Problem List Patient Active Problem List   Diagnosis Date Noted   Malnutrition of moderate degree 11/18/2019   Below-knee amputation of left lower extremity ([place]) 11/16/2019   Abscess of left foot 11/13/2019   Dehiscence of amputation stump ([place])    History of complete ray amputation of fifth toe of left foot ([place]) 10/09/2019   Acute pulmonary edema ([facility])    Osteomyelitis of fifth toe of left foot ([place])    Febrile illness 08/17/2019   SIRS (systemic inflammatory response syndrome) ([place]) 08/17/2019   Thrombocytopenia ([place]) 08/17/2019   Pressure injury of back, stage 2 ([place]) 08/17/2019   Hypertensive emergency 07/10/2019   Onychomycosis 08/16/2016   Osteopathy in diseases classified elsewhere, unspecified site 05/14/2016   S/P bilateral below knee amputation ([place]) 04/26/2016   Bilateral carotid bruits 03/30/2016   Diabetes mellitus  with complication ([place])    Anemia due to chronic kidney disease    ESRD on dialysis ([place]) 11/30/2015   Acute respiratory failure with hypoxia ([place]) 11/29/2015   Dependence on renal dialysis ([place]) 08/15/2015   Presence of automatic (implantable) cardiac defibrillator 03/12/2015   At high risk for falls 09/29/2014   Peripheral vascular disease ([place]) 09/29/2014   Osteoarthritis of both knees 01/07/2014   Osteoarthritis of multiple joints 10/06/2013   NICM (nonischemic cardiomyopathy) ([place]) 06/27/2013   CAD (coronary artery disease) 03/13/2013   Abnormal nuclear stress test 19/62/2297   Chronic systolic congestive heart failure ([place]) 01/27/2013  COPD (chronic obstructive pulmonary disease) ([place]) 12/27/2012   Diabetic peripheral neuropathy ([place]) 02/10/2012   Urethral stricture 10/06/2010   URINARY CALCULUS 09/28/2009   Adjustment disorder with depressed mood 08/21/2009   Poorly controlled type II diabetes mellitus with renal complication ([place]) 01/74/9449   Hyperlipidemia 04/14/2008   Essential hypertension 04/14/2008   Allergic rhinitis 04/14/2008    Anthony Bullock, PT, DPT 06/29/2020, 1:57 PM  [facility] [address] [place], [place], [address] Phone: [phone]   Fax:  [phone]  Name: Anthony Bullock MRN: [id number] Date of Birth: 1960-06-27

## 2020-06-30 DIAGNOSIS — N186 End stage renal disease: Secondary | ICD-10-CM | POA: Diagnosis not present

## 2020-06-30 DIAGNOSIS — D509 Iron deficiency anemia, unspecified: Secondary | ICD-10-CM | POA: Diagnosis not present

## 2020-06-30 DIAGNOSIS — N2581 Secondary hyperparathyroidism of renal origin: Secondary | ICD-10-CM | POA: Diagnosis not present

## 2020-06-30 DIAGNOSIS — E209 Hypoparathyroidism, unspecified: Secondary | ICD-10-CM | POA: Diagnosis not present

## 2020-06-30 DIAGNOSIS — Z992 Dependence on renal dialysis: Secondary | ICD-10-CM | POA: Diagnosis not present

## 2020-06-30 DIAGNOSIS — D631 Anemia in chronic kidney disease: Secondary | ICD-10-CM | POA: Diagnosis not present

## 2020-07-01 ENCOUNTER — Encounter: Payer: Self-pay | Admitting: Physical Therapy

## 2020-07-01 ENCOUNTER — Other Ambulatory Visit: Payer: Self-pay

## 2020-07-01 ENCOUNTER — Ambulatory Visit (INDEPENDENT_AMBULATORY_CARE_PROVIDER_SITE_OTHER): Payer: Medicare Other | Admitting: Physical Therapy

## 2020-07-01 DIAGNOSIS — R2681 Unsteadiness on feet: Secondary | ICD-10-CM

## 2020-07-01 DIAGNOSIS — M6281 Muscle weakness (generalized): Secondary | ICD-10-CM | POA: Diagnosis not present

## 2020-07-01 DIAGNOSIS — R2689 Other abnormalities of gait and mobility: Secondary | ICD-10-CM | POA: Diagnosis not present

## 2020-07-01 NOTE — Therapy (Signed)
[facility] [address] [place], [place], [address] Phone: [phone]   Fax:  [phone]  Physical Therapy Treatment  Patient Details  Name: Anthony Bullock MRN: [id number] Date of Birth: Mar 14, 1960 Referring Provider (PT): Bevely Palmer Persons, [place]   Encounter Date: 07/01/2020   PT End of Session - 07/01/20 1339     Visit Number 16    Number of Visits 25    Date for PT Re-Evaluation 07/23/20    Authorization Type Medicare & BCBS supplement    PT Start Time 1339    PT Stop Time 1425    PT Time Calculation (min) 46 min    Equipment Utilized During Treatment Gait belt    Activity Tolerance Patient tolerated treatment well;Patient limited by fatigue    Behavior During Therapy WFL for tasks assessed/performed             Past Medical History:  Diagnosis Date   AICD (automatic cardioverter/defibrillator) present    [employer]   Allergic rhinitis    Anemia    Arthritis    Chronic systolic heart failure ([place])    a. ECHO (12/2012) EF 25-30%, HK entireanteroseptal myocardium //  b.  EF 25%, diffuse HK, grade 1 diastolic dysfunction, MAC, mild LAE, normal RVSF, trivial pericardial effusion   COPD (chronic obstructive pulmonary disease) ([place])    Diabetes mellitus type II    Diabetic nephropathy ([place])    Diabetic neuropathy ([place])    ESRD on hemodialysis ([place])    started HD June 2017, goes to [facility] HD unit, Dr Hinda Lenis   History of cardiac catheterization    a.Myoview 1/15:  There is significant left ventricular dysfunction. There may be slight scar at the apex. There is no significant ischemia. LV Ejection Fraction: 27%  //  b. RHC/LHC (1/15) with mean RA 6, PA 47/22 mean 33, mean PCWP 20, PVR 2.5 WU, CI 2.5; 80% dLAD stenosis, 70% diffuse large D.     History of kidney stones    Hyperlipidemia    Hypertension    Kidney stones    NICM (nonischemic cardiomyopathy) ([place])    Primarily nonischemic.  Echo (12/14) with EF 25-30%.   Echo (3/15) with EF 25%, mild to moderately dilated LV, normal RV size and systolic function.     Osteomyelitis ([place])    left fifth ray   Pneumonia    Urethral stricture    Wears glasses     Past Surgical History:  Procedure Laterality Date   ABDOMINAL AORTOGRAM W/LOWER EXTREMITY N/A 03/30/2016   Procedure: Abdominal Aortogram w/Lower Extremity;  Surgeon: Angelia Mould, MD;  Location: [place] [facility];  Service: Cardiovascular;  Laterality: N/A;   AMPUTATION Right 04/26/2016   Procedure: Right Below Knee Amputation;  Surgeon: Newt Minion, MD;  Location: [place];  Service: Orthopedics;  Laterality: Right;   AMPUTATION Left 08/21/2019   Procedure: LEFT FOOT 5TH RAY AMPUTATION;  Surgeon: Newt Minion, MD;  Location: [place];  Service: Orthopedics;  Laterality: Left;   AMPUTATION Left 11/13/2019   Procedure: LEFT BELOW KNEE AMPUTATION;  Surgeon: Newt Minion, MD;  Location: [place];  Service: Orthopedics;  Laterality: Left;   AV FISTULA PLACEMENT Right 09/08/2015   Procedure: INSERTION OF 4-73m x 45cm  ARTERIOVENOUS (AV) GORE-TEX GRAFT RIGHT UPPER  ARM;  Surgeon: CAngelia Mould MD;  [place]  Service: Vascular;  Laterality: Right;   AV FISTULA PLACEMENT Left 01/14/2016   Procedure: CREATION OF LEFT UPPER  ARM ARTERIOVENOUS FISTULA;  Surgeon: Angelia Mould, MD;  Location: [place];  Service: Vascular;  Laterality: Left;   [place] Right 08/22/2014   Procedure: RIGHT UPPER ARM [place];  Surgeon: Angelia Mould, MD;  Location: [place];  Service: Vascular;  Laterality: Right;   BELOW KNEE LEG AMPUTATION Right 04/26/2016   CARDIAC CATHETERIZATION     CARDIAC DEFIBRILLATOR PLACEMENT  06/27/2013   Sub Q       BY DR Caryl Comes   CATARACT EXTRACTION W/PHACO Right 08/06/2018   Procedure: CATARACT EXTRACTION PHACO AND INTRAOCULAR LENS PLACEMENT ([place]);  Surgeon: Baruch Goldmann, MD;  Location: [facility];  Service: Ophthalmology;  Laterality: Right;   CDE: 4.06   CATARACT EXTRACTION W/PHACO Left 08/20/2018   Procedure: CATARACT EXTRACTION PHACO AND INTRAOCULAR LENS PLACEMENT (I[facility]);  Surgeon: Baruch Goldmann, MD;  Location: [facility];  Service: Ophthalmology;  Laterality: Left;  CDE: 6.76   COLONOSCOPY WITH PROPOFOL N/A 07/22/2015   Procedure: COLONOSCOPY WITH PROPOFOL;  Surgeon: Doran Stabler, MD;  Location: [facility];  Service: Gastroenterology;  Laterality: N/A;   FEMORAL-POPLITEAL BYPASS GRAFT Right 03/31/2016   Procedure: BYPASS GRAFT FEMORAL-POPLITEAL ARTERY USING RIGHT GREATER SAPHENOUS NONREVERSED VEIN;  Surgeon: Angelia Mould, MD;  Location: [place];  Service: Vascular;  Laterality: Right;   HERNIA REPAIR     I & D EXTREMITY Right 03/31/2016   Procedure: IRRIGATION AND DEBRIDEMENT FOOT;  Surgeon: Angelia Mould, MD;  Location: [place];  Service: Vascular;  Laterality: Right;   IMPLANTABLE CARDIOVERTER DEFIBRILLATOR IMPLANT N/A 06/27/2013   Procedure: SUB Q ICD;  Surgeon: Deboraha Sprang, MD;  Location: [facility];  Service: Cardiovascular;  Laterality: N/A;   INTRAOPERATIVE ARTERIOGRAM Right 03/31/2016   Procedure: INTRA OPERATIVE ARTERIOGRAM;  Surgeon: Angelia Mould, MD;  Location: [place];  Service: Vascular;  Laterality: Right;   IR GENERIC HISTORICAL Right 11/30/2015   IR THROMBECTOMY AV FISTULA W/THROMBOLYSIS/PTA INC/SHUNT/IMG RIGHT 11/30/2015 Aletta Edouard, MD [facility]   IR GENERIC HISTORICAL  11/30/2015   IR US GUIDE VASC ACCESS RIGHT 11/30/2015 Aletta Edouard, MD [facility]   IR GENERIC HISTORICAL Right 12/15/2015   IR THROMBECTOMY AV FISTULA W/THROMBOLYSIS/PTA/STENT INC/SHUNT/IMG RT 12/15/2015 Arne Cleveland, MD [facility]   IR GENERIC HISTORICAL  12/15/2015   IR US GUIDE VASC ACCESS RIGHT 12/15/2015 Arne Cleveland, MD [facility]   IR GENERIC HISTORICAL  12/28/2015   IR FLUORO GUIDE CV LINE RIGHT 12/28/2015 Marybelle Killings, MD [facility]   IR GENERIC HISTORICAL  12/28/2015   IR US GUIDE VASC ACCESS  RIGHT 12/28/2015 Marybelle Killings, MD [facility]   LEFT A ND RIGHT HEART CATH  01/30/2013   DR Sung Amabile   LEFT AND RIGHT HEART CATHETERIZATION WITH CORONARY ANGIOGRAM N/A 01/30/2013   Procedure: LEFT AND RIGHT HEART CATHETERIZATION WITH CORONARY ANGIOGRAM;  Surgeon: Jolaine Artist, MD;  Location: [facility] CATH LAB;  Service: Cardiovascular;  Laterality: N/A;   PERIPHERAL VASCULAR CATHETERIZATION Right 01/26/2015   Procedure: A/V Fistulagram;  Surgeon: Angelia Mould, MD;  Location: [place] [facility];  Service: Cardiovascular;  Laterality: Right;   reapea urethral surgery for recurrent obstruction  2011   TOTAL KNEE ARTHROPLASTY Right 2007   VEIN HARVEST Right 03/31/2016   Procedure: RIGHT GREATER SAPHENOUS VEIN HARVEST;  Surgeon: Angelia Mould, MD;  Location: [place];  Service: Vascular;  Laterality: Right;    There were no vitals filed for this visit.   Subjective Assessment - 07/01/20 1339  Subjective He saw prosthetist who changed angle of left prosthesis which improved anterior tibia pain but it's still sore.    Patient is accompained by: Family member   wife, Codee Tutson   Pertinent History Left TTA, automatic cardioverter/defibrillator, arthritis, CAD, cardiomyopathy, CHF, COPD, DM2, neuropathy, ESRD, CAD w/catherization, HTN, kidney stones, right TKA 2007    Patient Stated Goals Walk in home & community.    Currently in Pain? Yes    Pain Score 5     Pain Location Leg   residual limb   Pain Orientation Left;Anterior    Pain Descriptors / Indicators Sharp    Pain Type Other (Comment)   prosthetic   Pain Onset 1 to 4 weeks ago    Pain Frequency Intermittent    Aggravating Factors  walking with prosthesis pressure    Pain Relieving Factors sitting to take pressure off limb                               [facility] Adult PT Treatment/Exercise - 07/01/20 1339       Transfers   Sit to Stand 5: Supervision;With upper extremity assist;With armrests;From  chair/3-in-1;Other (comment)   to RW   Sit to Stand Details Visual cues for safe use of DME/AE;Verbal cues for sequencing;Verbal cues for technique    Stand to Sit 5: Supervision;With upper extremity assist;With armrests;To chair/3-in-1;Other (comment)   from RW   Stand to Sit Details (indicate cue type and reason) Visual cues for safe use of DME/AE;Verbal cues for sequencing;Verbal cues for technique      Ambulation/Gait   Ambulation/Gait Yes    Ambulation/Gait Assistance 5: Supervision;4: Min guard   Min guard cane stand alone tip & supervision RW   Ambulation Distance (Feet) 300 Feet   300' cane & 75' no device   Assistive device Prostheses;Straight cane;None   cane stand alone tip   Ramp 4: Min assist   indoor ramp w/ cane & outdoor curb cut w/o device   Curb 3: Mod assist;4: Min assist   minA w/cane & modA w/o device & bil. TTA prostheses   Pre-Gait Activities in //bars stance on incline board with 6" at top - BUE support 5 reps ea LE ea uphill & downhill.    Gait Comments gait inside //bars wthout UE support with supervion 7' X 3      High Level Balance   High Level Balance Activities Backward walking;Side stepping;Turns   inslde //bars without UE support 7' X 3 laps ea. & eyes closed   High Level Balance Comments PT demo, verbal, mirror for visual & tactile cues on technique      Self-Care   Self-Care Lifting    Lifting PT demo & verbal cues on technique with bil TTA prostheses picking up items from floor. He performed 1x with RLE forward & 1x with LLE forward with min guard / tactile cues.  PT demo & instructed on set up with chair behind & RW in front to practice at home. Pt verbalized understanding.      Exercises   Exercises Other Exercises;Knee/Hip      Knee/Hip Exercises: Stretches   Active Hamstring Stretch Right;Left;2 reps;30 seconds    Active Hamstring Stretch Limitations supine with strap DF      Knee/Hip Exercises: Machines for Strengthening   Total Gym Leg Press  shuttle leg press 125# 15 reps 1st set back 45* & 2nd set back flat single  leg stance on ea LE by lifting other LE off plate for ~3TRV.      Knee/Hip Exercises: Standing   Forward Step Up Right;Left;1 set;Hand Hold: 2;5 reps;Step Height: 8"   BOSU round side up   Forward Step Up Limitations demo, tactile & verbal cues on technique    Step Down Right;Left;1 set;Hand Hold: 2;5 reps;Step Height: 8"   BOSU round side up   Step Down Limitations demo, tactile & verbal cues on technique    Rocker Board 1 minute   ant/level/post & rt/leel/lt   Rocker Board Limitations verbal & tactile cues on technique      Knee/Hip Exercises: Seated   Sit to Sand 1 set;10 reps;without UE support   from 24" bar stool     Prosthetics   Prosthetic Care Comments  demo & verbal cues on rationale & use of cut off socks.    Current prosthetic wear tolerance (days/week)  daily    Current prosthetic wear tolerance (#hours/day)  most of awake hours    Edema pitting flucutating with dialysis    Residual limb condition  wound healed but thin white epitheal covering so continue Tegaderm for at least 1 week for protection.    Education Provided Correct ply sock adjustment;Other (comment)   see prosthetic care comments                Balance Exercises - 07/01/20 1339       Balance Exercises: Standing   Standing Eyes Opened Wide (BOA);Head turns;5 reps;Solid surface   head turns right/left, up/down & diagonals,   Standing Eyes Opened Limitations tactile cues no touch of //bars today    Standing Eyes Closed Wide (BOA);5 reps;Solid surface;10 secs    Standing Eyes Closed Limitations static for 10 sec                 PT Short Term Goals - 06/24/20 1427       PT SHORT TERM GOAL #1   Title Patient verbalizes proper sweat management with prostheses.    Time 1    Period Months    Status Achieved    Target Date 06/26/20      PT SHORT TERM GOAL #2   Title Patient tolerates bilateral prostheses >90% awake  hrs total /day without skin issues    Time 1    Period Months    Status Achieved    Target Date 06/26/20      PT SHORT TERM GOAL #3   Title Patient able to reach 10" without UE support safely    Baseline met 6/3, 11 inch reach    Time 1    Period Months    Status Achieved    Target Date 06/26/20      PT SHORT TERM GOAL #4   Title Patient ambulates 100' with cane & prostheses including sit / stand chair with armrests with supervision    Baseline 150 feet with supervision on 6/3    Time 1    Period Months    Status Achieved    Target Date 06/26/20      PT SHORT TERM GOAL #5   Title Patient negotiates ramps & curbs with cane & prostheses with minA.    Baseline min A for ramp, min A to descend curb but sill needs mod A to ascend curb    Time 1    Period Months    Status Partially Met    Target Date 06/26/20  PT Long Term Goals - 07/01/20 1608       PT LONG TERM GOAL #1   Title Patient demonstrates & verbalized understanding of prosthetic care to enable safe utilization of prostheses.    Time 12    Period Weeks    Status On-going    Target Date 07/23/20      PT LONG TERM GOAL #2   Title Patient tolerates prostheses wear for >90% of awake hours without skin issues    Time 12    Period Weeks    Status On-going    Target Date 07/23/20      PT LONG TERM GOAL #3   Title Berg Balance >/= 36/56 to indicate lower fall risk    Time 12    Period Weeks    Status On-going    Target Date 07/23/20      PT LONG TERM GOAL #4   Title Patient ambulates 300' with LRAD & prostheses modified independent    Time 12    Period Weeks    Status On-going    Target Date 07/23/20      PT LONG TERM GOAL #5   Title Patient negotiates ramps, curbs & stairs with LRAD & prostheses modified independent.    Time 12    Period Weeks    Status On-going    Target Date 07/23/20                   Plan - 07/01/20 1339     Clinical Impression Statement Patient is  progressing with balance & strength.  He is improving with prosthetic gait with cane for community based activities.    Personal Factors and Comorbidities Comorbidity 3+;Fitness;Time since onset of injury/illness/exacerbation    Comorbidities Left TTA, automatic cardioverter/defibrillator, arthritis, CAD, cardiomyopathy, CHF, COPD, DM2, neuropathy, ESRD, CAD w/catherization, HTN, kidney stones, right TKA 2007    Examination-Activity Limitations Lift;Locomotion Level;Squat;Stairs;Stand;Transfers    Examination-Participation Restrictions Driving;Community Activity    Stability/Clinical Decision Making Evolving/Moderate complexity    Rehab Potential Good    PT Frequency 2x / week    PT Duration 12 weeks    PT Treatment/Interventions ADLs/Self Care Home Management;DME Instruction;Gait training;Stair training;Functional mobility training;Therapeutic activities;Therapeutic exercise;Balance training;Neuromuscular re-education;Patient/family education;Prosthetic Training;Vestibular    PT Next Visit Plan work towards [place],  review prosthetic care, prosthetic gait with cane including ramps & curbs, therapeutic exercise to increase functional strength & endurance.    Consulted and Agree with Plan of Care Patient             Patient will benefit from skilled therapeutic intervention in order to improve the following deficits and impairments:  Abnormal gait, Decreased activity tolerance, Decreased balance, Decreased endurance, Decreased knowledge of use of DME, Decreased mobility, Decreased skin integrity, Decreased strength, Increased edema, Impaired flexibility, Postural dysfunction, Prosthetic Dependency, Pain  Visit Diagnosis: Other abnormalities of gait and mobility  Muscle weakness (generalized)  Unsteadiness on feet     Problem List Patient Active Problem List   Diagnosis Date Noted   Malnutrition of moderate degree 11/18/2019   Below-knee amputation of left lower extremity ([place])  11/16/2019   Abscess of left foot 11/13/2019   Dehiscence of amputation stump ([place])    History of complete ray amputation of fifth toe of left foot ([place]) 10/09/2019   Acute pulmonary edema ([facility])    Osteomyelitis of fifth toe of left foot ([place])    Febrile illness 08/17/2019   SIRS (systemic inflammatory response syndrome) ([place]) 08/17/2019   Thrombocytopenia (  [place]) 08/17/2019   Pressure injury of back, stage 2 ([place]) 08/17/2019   Hypertensive emergency 07/10/2019   Onychomycosis 08/16/2016   Osteopathy in diseases classified elsewhere, unspecified site 05/14/2016   S/P bilateral below knee amputation ([place]) 04/26/2016   Bilateral carotid bruits 03/30/2016   Diabetes mellitus with complication ([place])    Anemia due to chronic kidney disease    ESRD on dialysis ([place]) 11/30/2015   Acute respiratory failure with hypoxia ([place]) 11/29/2015   Dependence on renal dialysis ([place]) 08/15/2015   Presence of automatic (implantable) cardiac defibrillator 03/12/2015   At high risk for falls 09/29/2014   Peripheral vascular disease ([place]) 09/29/2014   Osteoarthritis of both knees 01/07/2014   Osteoarthritis of multiple joints 10/06/2013   NICM (nonischemic cardiomyopathy) ([place]) 06/27/2013   CAD (coronary artery disease) 03/13/2013   Abnormal nuclear stress test 06/00/4599   Chronic systolic congestive heart failure ([place]) 01/27/2013   COPD (chronic obstructive pulmonary disease) ([place]) 12/27/2012   Diabetic peripheral neuropathy ([place]) 02/10/2012   Urethral stricture 10/06/2010   URINARY CALCULUS 09/28/2009   Adjustment disorder with depressed mood 08/21/2009   Poorly controlled type II diabetes mellitus with renal complication ([place]) 77/41/4239   Hyperlipidemia 04/14/2008   Essential hypertension 04/14/2008   Allergic rhinitis 04/14/2008    Jamey Reas, PT, DPT 07/01/2020, 4:13 PM  [facility] [address] [place], [place], [address] Phone: [phone]   Fax:   [phone]  Name: Anthony Bullock MRN: [id number] Date of Birth: [birth year]

## 2020-07-02 DIAGNOSIS — Z992 Dependence on renal dialysis: Secondary | ICD-10-CM | POA: Diagnosis not present

## 2020-07-02 DIAGNOSIS — D631 Anemia in chronic kidney disease: Secondary | ICD-10-CM | POA: Diagnosis not present

## 2020-07-02 DIAGNOSIS — N186 End stage renal disease: Secondary | ICD-10-CM | POA: Diagnosis not present

## 2020-07-02 DIAGNOSIS — D509 Iron deficiency anemia, unspecified: Secondary | ICD-10-CM | POA: Diagnosis not present

## 2020-07-02 DIAGNOSIS — E209 Hypoparathyroidism, unspecified: Secondary | ICD-10-CM | POA: Diagnosis not present

## 2020-07-02 DIAGNOSIS — N2581 Secondary hyperparathyroidism of renal origin: Secondary | ICD-10-CM | POA: Diagnosis not present

## 2020-07-04 DIAGNOSIS — D631 Anemia in chronic kidney disease: Secondary | ICD-10-CM | POA: Diagnosis not present

## 2020-07-04 DIAGNOSIS — E209 Hypoparathyroidism, unspecified: Secondary | ICD-10-CM | POA: Diagnosis not present

## 2020-07-04 DIAGNOSIS — N186 End stage renal disease: Secondary | ICD-10-CM | POA: Diagnosis not present

## 2020-07-04 DIAGNOSIS — Z992 Dependence on renal dialysis: Secondary | ICD-10-CM | POA: Diagnosis not present

## 2020-07-04 DIAGNOSIS — N2581 Secondary hyperparathyroidism of renal origin: Secondary | ICD-10-CM | POA: Diagnosis not present

## 2020-07-04 DIAGNOSIS — D509 Iron deficiency anemia, unspecified: Secondary | ICD-10-CM | POA: Diagnosis not present

## 2020-07-06 ENCOUNTER — Other Ambulatory Visit: Payer: Self-pay

## 2020-07-06 ENCOUNTER — Ambulatory Visit (INDEPENDENT_AMBULATORY_CARE_PROVIDER_SITE_OTHER): Payer: Medicare Other | Admitting: Physical Therapy

## 2020-07-06 ENCOUNTER — Encounter: Payer: Self-pay | Admitting: Physical Therapy

## 2020-07-06 DIAGNOSIS — M6281 Muscle weakness (generalized): Secondary | ICD-10-CM | POA: Diagnosis not present

## 2020-07-06 DIAGNOSIS — R2689 Other abnormalities of gait and mobility: Secondary | ICD-10-CM

## 2020-07-06 DIAGNOSIS — R2681 Unsteadiness on feet: Secondary | ICD-10-CM | POA: Diagnosis not present

## 2020-07-06 NOTE — Therapy (Signed)
[facility] [address] [place], [place], [address] Phone: [phone]   Fax:  [phone]  Physical Therapy Treatment  Patient Details  Name: Anthony Bullock MRN: [id number] Date of Birth: 1960-08-14 Referring Provider (PT): Bevely Palmer Persons, [place]   Encounter Date: 07/06/2020   PT End of Session - 07/06/20 1302     Visit Number 17    Number of Visits 25    Date for PT Re-Evaluation 07/23/20    Authorization Type Medicare & BCBS supplement    PT Start Time 1300    PT Stop Time 1340    PT Time Calculation (min) 40 min    Equipment Utilized During Treatment Gait belt    Activity Tolerance Patient tolerated treatment well;Patient limited by fatigue    Behavior During Therapy WFL for tasks assessed/performed             Past Medical History:  Diagnosis Date   AICD (automatic cardioverter/defibrillator) present    [employer]   Allergic rhinitis    Anemia    Arthritis    Chronic systolic heart failure ([place])    a. ECHO (12/2012) EF 25-30%, HK entireanteroseptal myocardium //  b.  EF 25%, diffuse HK, grade 1 diastolic dysfunction, MAC, mild LAE, normal RVSF, trivial pericardial effusion   COPD (chronic obstructive pulmonary disease) ([place])    Diabetes mellitus type II    Diabetic nephropathy ([place])    Diabetic neuropathy ([place])    ESRD on hemodialysis ([place])    started HD June 2017, goes to [facility] HD unit, Dr Hinda Lenis   History of cardiac catheterization    a.Myoview 1/15:  There is significant left ventricular dysfunction. There may be slight scar at the apex. There is no significant ischemia. LV Ejection Fraction: 27%  //  b. RHC/LHC (1/15) with mean RA 6, PA 47/22 mean 33, mean PCWP 20, PVR 2.5 WU, CI 2.5; 80% dLAD stenosis, 70% diffuse large D.     History of kidney stones    Hyperlipidemia    Hypertension    Kidney stones    NICM (nonischemic cardiomyopathy) ([place])    Primarily nonischemic.  Echo (12/14) with EF 25-30%.   Echo (3/15) with EF 25%, mild to moderately dilated LV, normal RV size and systolic function.     Osteomyelitis ([place])    left fifth ray   Pneumonia    Urethral stricture    Wears glasses     Past Surgical History:  Procedure Laterality Date   ABDOMINAL AORTOGRAM W/LOWER EXTREMITY N/A 03/30/2016   Procedure: Abdominal Aortogram w/Lower Extremity;  Surgeon: Angelia Mould, MD;  Location: [place] [facility];  Service: Cardiovascular;  Laterality: N/A;   AMPUTATION Right 04/26/2016   Procedure: Right Below Knee Amputation;  Surgeon: Newt Minion, MD;  Location: [place];  Service: Orthopedics;  Laterality: Right;   AMPUTATION Left 08/21/2019   Procedure: LEFT FOOT 5TH RAY AMPUTATION;  Surgeon: Newt Minion, MD;  Location: [place];  Service: Orthopedics;  Laterality: Left;   AMPUTATION Left 11/13/2019   Procedure: LEFT BELOW KNEE AMPUTATION;  Surgeon: Newt Minion, MD;  Location: [place];  Service: Orthopedics;  Laterality: Left;   AV FISTULA PLACEMENT Right 09/08/2015   Procedure: INSERTION OF 4-45m x 45cm  ARTERIOVENOUS (AV) GORE-TEX GRAFT RIGHT UPPER  ARM;  Surgeon: CAngelia Mould MD;  Location: [place]  Service: Vascular;  Laterality: Right;   AV FISTULA PLACEMENT Left 01/14/2016   Procedure: CREATION OF LEFT UPPER  ARM ARTERIOVENOUS FISTULA;  Surgeon: Angelia Mould, MD;  Location: [place];  Service: Vascular;  Laterality: Left;   [place] Right 08/22/2014   Procedure: RIGHT UPPER ARM [place];  Surgeon: Angelia Mould, MD;  Location: [place];  Service: Vascular;  Laterality: Right;   BELOW KNEE LEG AMPUTATION Right 04/26/2016   CARDIAC CATHETERIZATION     CARDIAC DEFIBRILLATOR PLACEMENT  06/27/2013   Sub Q       BY DR Caryl Comes   CATARACT EXTRACTION W/PHACO Right 08/06/2018   Procedure: CATARACT EXTRACTION PHACO AND INTRAOCULAR LENS PLACEMENT ([place]);  Surgeon: Baruch Goldmann, MD;  Location: [facility];  Service: Ophthalmology;  Laterality: Right;   CDE: 4.06   CATARACT EXTRACTION W/PHACO Left 08/20/2018   Procedure: CATARACT EXTRACTION PHACO AND INTRAOCULAR LENS PLACEMENT (IOC);  Surgeon: Baruch Goldmann, MD;  Location: [facility];  Service: Ophthalmology;  Laterality: Left;  CDE: 6.76   COLONOSCOPY WITH PROPOFOL N/A 07/22/2015   Procedure: COLONOSCOPY WITH PROPOFOL;  Surgeon: Doran Stabler, MD;  [facility];  Service: Gastroenterology;  Laterality: N/A;   FEMORAL-POPLITEAL BYPASS GRAFT Right 03/31/2016   Procedure: BYPASS GRAFT FEMORAL-POPLITEAL ARTERY USING RIGHT GREATER SAPHENOUS NONREVERSED VEIN;  Surgeon: Angelia Mould, MD;  Location: [place];  Service: Vascular;  Laterality: Right;   HERNIA REPAIR     I & D EXTREMITY Right 03/31/2016   Procedure: IRRIGATION AND DEBRIDEMENT FOOT;  Surgeon: Angelia Mould, MD;  Location: [place];  Service: Vascular;  Laterality: Right;   IMPLANTABLE CARDIOVERTER DEFIBRILLATOR IMPLANT N/A 06/27/2013   Procedure: SUB Q ICD;  Surgeon: Deboraha Sprang, MD;  Location: [facility] CATH LAB;  Service: Cardiovascular;  Laterality: N/A;   INTRAOPERATIVE ARTERIOGRAM Right 03/31/2016   Procedure: INTRA OPERATIVE ARTERIOGRAM;  Surgeon: Angelia Mould, MD;  Location: [place];  Service: Vascular;  Laterality: Right;   IR GENERIC HISTORICAL Right 11/30/2015   IR THROMBECTOMY AV FISTULA W/THROMBOLYSIS/PTA INC/SHUNT/IMG RIGHT 11/30/2015 Aletta Edouard, MD [facility]   IR GENERIC HISTORICAL  11/30/2015   IR US GUIDE VASC ACCESS RIGHT 11/30/2015 Aletta Edouard, MD [facility]   IR GENERIC HISTORICAL Right 12/15/2015   IR THROMBECTOMY AV FISTULA W/THROMBOLYSIS/PTA/STENT INC/SHUNT/IMG RT 12/15/2015 Arne Cleveland, MD [facility]   IR GENERIC HISTORICAL  12/15/2015   IR US GUIDE VASC ACCESS RIGHT 12/15/2015 Arne Cleveland, MD [facility]   IR GENERIC HISTORICAL  12/28/2015   IR FLUORO GUIDE CV LINE RIGHT 12/28/2015 Marybelle Killings, MD [facility]   IR GENERIC HISTORICAL  12/28/2015   IR US GUIDE VASC ACCESS  RIGHT 12/28/2015 Marybelle Killings, MD [facility]   LEFT A ND RIGHT HEART CATH  01/30/2013   DR Sung Amabile   LEFT AND RIGHT HEART CATHETERIZATION WITH CORONARY ANGIOGRAM N/A 01/30/2013   Procedure: LEFT AND RIGHT HEART CATHETERIZATION WITH CORONARY ANGIOGRAM;  Surgeon: Jolaine Artist, MD;  Location: [facility] C[facility] LAB;  Service: Cardiovascular;  Laterality: N/A;   PERIPHERAL VASCULAR CATHETERIZATION Right 01/26/2015   Procedure: A/V Fistulagram;  Surgeon: Angelia Mould, MD;  Location: [place] [facility];  Service: Cardiovascular;  Laterality: Right;   reapea urethral surgery for recurrent obstruction  2011   TOTAL KNEE ARTHROPLASTY Right 2007   VEIN HARVEST Right 03/31/2016   Procedure: RIGHT GREATER SAPHENOUS VEIN HARVEST;  Surgeon: Angelia Mould, MD;  Location: [place];  Service: Vascular;  Laterality: Right;    There were no vitals filed for this visit.   Subjective Assessment - 07/06/20 1300  Subjective No falls.  He  is walking without cane in house except when tired after dialysis.    Patient is accompained by: Family member   wife, Codi Kertz   Pertinent History Left TTA, automatic cardioverter/defibrillator, arthritis, CAD, cardiomyopathy, CHF, COPD, DM2, neuropathy, ESRD, CAD w/catherization, HTN, kidney stones, right TKA 2007    Patient Stated Goals Walk in home & community.    Currently in Pain? No/denies    Pain Onset 1 to 4 weeks ago                               [facility] Adult PT Treatment/Exercise - 07/06/20 1300       Transfers   Sit to Stand 5: Supervision;With upper extremity assist;With armrests;From chair/3-in-1;Other (comment)   to RW   Sit to Stand Details Visual cues for safe use of DME/AE;Verbal cues for sequencing;Verbal cues for technique    Stand to Sit 5: Supervision;With upper extremity assist;With armrests;To chair/3-in-1;Other (comment)   from RW   Stand to Sit Details (indicate cue type and reason) Visual cues for safe use of  DME/AE;Verbal cues for sequencing;Verbal cues for technique      Ambulation/Gait   Ambulation/Gait Yes    Ambulation/Gait Assistance 5: Supervision;4: Min guard   Min guard cane stand alone tip & supervision RW   Ambulation Distance (Feet) 300 Feet   300' cane & 75' x 2  no device   Assistive device Prostheses;Straight cane;None   cane stand alone tip   Ramp 4: Min assist   indoor ramp w/ cane   Ramp Details (indicate cue type and reason) PT verbal & tactile cues on technique with prostheses..    Curb 4: Min assist   minA w/cane & bil. TTA prostheses   Curb Details (indicate cue type and reason) PT verbal & tactile cues on technique with prostheses..    Gait Comments --      High Level Balance   High Level Balance Activities Backward walking;Side stepping;Turns;Other (comment);Negotiating over obstacles   quarter turns,  inslde //bars without UE support 7' X 3 laps ea. & eyes closed   High Level Balance Comments PT demo, verbal, mirror for visual & tactile cues on technique      Self-Care   Self-Care --    Lifting --      Exercises   Exercises Other Exercises;Knee/Hip      Knee/Hip Exercises: Stretches   Active Hamstring Stretch Right;Left;2 reps;30 seconds    Active Hamstring Stretch Limitations supine with strap DF      Knee/Hip Exercises: Machines for Strengthening   Total Gym Leg Press shuttle leg press 125# 15 reps 1st set back 45* & 2nd set back flat single leg stance on ea LE by lifting other LE off plate for ~4BSW.      Knee/Hip Exercises: Standing   Forward Step Up Right;Left;1 set;Hand Hold: 2;5 reps;Step Height: 8"   BOSU round side up   Forward Step Up Limitations demo, tactile & verbal cues on technique    Step Down Right;Left;1 set;Hand Hold: 2;5 reps;Step Height: 8"   BOSU round side up   Step Down Limitations demo, tactile & verbal cues on technique    Rocker Board 1 minute   ant/level/post & rt/leel/lt   Rocker Board Limitations verbal & tactile cues on technique       Knee/Hip Exercises: Seated   Sit to Sand 1 set;10 reps;without UE support  from 24" bar stool     Prosthetics   Prosthetic Care Comments  --    Current prosthetic wear tolerance (days/week)  daily    Current prosthetic wear tolerance (#hours/day)  most of awake hours    Edema pitting flucutating with dialysis    Residual limb condition  wound healed but thin white epitheal covering so continue Tegaderm for at least 1 week for protection.    Education Provided Other (comment)   see prosthetic care comments                Balance Exercises - 07/06/20 1300       Balance Exercises: Standing   Stepping Strategy Anterior;Posterior;5 reps    Stepping Strategy Limitations anticipatory stepping off foam beam catching balance.  LLE < RLE esp posteriorly                 PT Short Term Goals - 06/24/20 1427       PT SHORT TERM GOAL #1   Title Patient verbalizes proper sweat management with prostheses.    Time 1    Period Months    Status Achieved    Target Date 06/26/20      PT SHORT TERM GOAL #2   Title Patient tolerates bilateral prostheses >90% awake hrs total /day without skin issues    Time 1    Period Months    Status Achieved    Target Date 06/26/20      PT SHORT TERM GOAL #3   Title Patient able to reach 10" without UE support safely    Baseline met 6/3, 11 inch reach    Time 1    Period Months    Status Achieved    Target Date 06/26/20      PT SHORT TERM GOAL #4   Title Patient ambulates 100' with cane & prostheses including sit / stand chair with armrests with supervision    Baseline 150 feet with supervision on 6/3    Time 1    Period Months    Status Achieved    Target Date 06/26/20      PT SHORT TERM GOAL #5   Title Patient negotiates ramps & curbs with cane & prostheses with minA.    Baseline min A for ramp, min A to descend curb but sill needs mod A to ascend curb    Time 1    Period Months    Status Partially Met    Target Date  06/26/20               PT Long Term Goals - 07/01/20 1608       PT LONG TERM GOAL #1   Title Patient demonstrates & verbalized understanding of prosthetic care to enable safe utilization of prostheses.    Time 12    Period Weeks    Status On-going    Target Date 07/23/20      PT LONG TERM GOAL #2   Title Patient tolerates prostheses wear for >90% of awake hours without skin issues    Time 12    Period Weeks    Status On-going    Target Date 07/23/20      PT LONG TERM GOAL #3   Title Berg Balance >/= 36/56 to indicate lower fall risk    Time 12    Period Weeks    Status On-going    Target Date 07/23/20      PT LONG TERM GOAL #4  Title Patient ambulates 300' with LRAD & prostheses modified independent    Time 12    Period Weeks    Status On-going    Target Date 07/23/20      PT LONG TERM GOAL #5   Title Patient negotiates ramps, curbs & stairs with LRAD & prostheses modified independent.    Time 12    Period Weeks    Status On-going    Target Date 07/23/20                   Plan - 07/06/20 1303     Clinical Impression Statement Patient improved his anticipatory step strategy with PT instruction & repetition.  He still struggles with ramps & curbs but is improving.    Personal Factors and Comorbidities Comorbidity 3+;Fitness;Time since onset of injury/illness/exacerbation    Comorbidities Left TTA, automatic cardioverter/defibrillator, arthritis, CAD, cardiomyopathy, CHF, COPD, DM2, neuropathy, ESRD, CAD w/catherization, HTN, kidney stones, right TKA 2007    Examination-Activity Limitations Lift;Locomotion Level;Squat;Stairs;Stand;Transfers    Examination-Participation Restrictions Driving;Community Activity    Stability/Clinical Decision Making Evolving/Moderate complexity    Rehab Potential Good    PT Frequency 2x / week    PT Duration 12 weeks    PT Treatment/Interventions ADLs/Self Care Home Management;DME Instruction;Gait training;Stair  training;Functional mobility training;Therapeutic activities;Therapeutic exercise;Balance training;Neuromuscular re-education;Patient/family education;Prosthetic Training;Vestibular    PT Next Visit Plan work towards [place],  review prosthetic care, prosthetic gait with cane including ramps & curbs, therapeutic exercise to increase functional strength & endurance.    Consulted and Agree with Plan of Care Patient             Patient will benefit from skilled therapeutic intervention in order to improve the following deficits and impairments:  Abnormal gait, Decreased activity tolerance, Decreased balance, Decreased endurance, Decreased knowledge of use of DME, Decreased mobility, Decreased skin integrity, Decreased strength, Increased edema, Impaired flexibility, Postural dysfunction, Prosthetic Dependency, Pain  Visit Diagnosis: Other abnormalities of gait and mobility  Muscle weakness (generalized)  Unsteadiness on feet     Problem List Patient Active Problem List   Diagnosis Date Noted   Malnutrition of moderate degree 11/18/2019   Below-knee amputation of left lower extremity ([place]) 11/16/2019   Abscess of left foot 11/13/2019   Dehiscence of amputation stump ([place])    History of complete ray amputation of fifth toe of left foot ([place]) 10/09/2019   Acute pulmonary edema ([facility])    Osteomyelitis of fifth toe of left foot ([place])    Febrile illness 08/17/2019   SIRS (systemic inflammatory response syndrome) ([place]) 08/17/2019   Thrombocytopenia ([place]) 08/17/2019   Pressure injury of back, stage 2 ([place]) 08/17/2019   Hypertensive emergency 07/10/2019   Onychomycosis 08/16/2016   Osteopathy in diseases classified elsewhere, unspecified site 05/14/2016   S/P bilateral below knee amputation ([place]) 04/26/2016   Bilateral carotid bruits 03/30/2016   Diabetes mellitus with complication ([place])    Anemia due to chronic kidney disease    ESRD on dialysis ([place]) 11/30/2015   Acute respiratory  failure with hypoxia ([place]) 11/29/2015   Dependence on renal dialysis ([place]) 08/15/2015   Presence of automatic (implantable) cardiac defibrillator 03/12/2015   At high risk for falls 09/29/2014   Peripheral vascular disease ([place]) 09/29/2014   Osteoarthritis of both knees 01/07/2014   Osteoarthritis of multiple joints 10/06/2013   NICM (nonischemic cardiomyopathy) ([place]) 06/27/2013   CAD (coronary artery disease) 03/13/2013   Abnormal nuclear stress test 02/40/9735   Chronic systolic congestive heart failure ([place]) 01/27/2013  COPD (chronic obstructive pulmonary disease) ([place]) 12/27/2012   Diabetic peripheral neuropathy ([place]) 02/10/2012   Urethral stricture 10/06/2010   URINARY CALCULUS 09/28/2009   Adjustment disorder with depressed mood 08/21/2009   Poorly controlled type II diabetes mellitus with renal complication ([place]) 10/93/2355   Hyperlipidemia 04/14/2008   Essential hypertension 04/14/2008   Allergic rhinitis 04/14/2008    Jamey Reas, PT, DPT 07/06/2020, 1:41 PM  [facility] [address] [place], [place], [address] Phone: [phone]   Fax:  [phone]  Name: Anthony Bullock MRN: [id number] Date of Birth: 1960/10/04

## 2020-07-07 DIAGNOSIS — E119 Type 2 diabetes mellitus without complications: Secondary | ICD-10-CM | POA: Diagnosis not present

## 2020-07-07 DIAGNOSIS — E209 Hypoparathyroidism, unspecified: Secondary | ICD-10-CM | POA: Diagnosis not present

## 2020-07-07 DIAGNOSIS — Z992 Dependence on renal dialysis: Secondary | ICD-10-CM | POA: Diagnosis not present

## 2020-07-07 DIAGNOSIS — Z794 Long term (current) use of insulin: Secondary | ICD-10-CM | POA: Diagnosis not present

## 2020-07-07 DIAGNOSIS — N2581 Secondary hyperparathyroidism of renal origin: Secondary | ICD-10-CM | POA: Diagnosis not present

## 2020-07-07 DIAGNOSIS — D631 Anemia in chronic kidney disease: Secondary | ICD-10-CM | POA: Diagnosis not present

## 2020-07-07 DIAGNOSIS — N186 End stage renal disease: Secondary | ICD-10-CM | POA: Diagnosis not present

## 2020-07-07 DIAGNOSIS — D509 Iron deficiency anemia, unspecified: Secondary | ICD-10-CM | POA: Diagnosis not present

## 2020-07-08 ENCOUNTER — Other Ambulatory Visit: Payer: Self-pay

## 2020-07-08 ENCOUNTER — Encounter: Payer: Self-pay | Admitting: Physical Therapy

## 2020-07-08 ENCOUNTER — Ambulatory Visit (INDEPENDENT_AMBULATORY_CARE_PROVIDER_SITE_OTHER): Payer: Medicare Other | Admitting: Physical Therapy

## 2020-07-08 DIAGNOSIS — R2681 Unsteadiness on feet: Secondary | ICD-10-CM | POA: Diagnosis not present

## 2020-07-08 DIAGNOSIS — M6281 Muscle weakness (generalized): Secondary | ICD-10-CM

## 2020-07-08 DIAGNOSIS — R2689 Other abnormalities of gait and mobility: Secondary | ICD-10-CM

## 2020-07-08 NOTE — Therapy (Signed)
[facility] [address] [place], [place], [address] Phone: [phone]   Fax:  [phone]  Physical Therapy Treatment  Patient Details  Name: Anthony Bullock MRN: [id number] Date of Birth: 03-04-1960 Referring Provider (PT): Bevely Palmer Persons, [place]   Encounter Date: 07/08/2020   PT End of Session - 07/08/20 1344     Visit Number 18    Number of Visits 25    Date for PT Re-Evaluation 07/23/20    Authorization Type Medicare & BCBS supplement    PT Start Time 1345    PT Stop Time 1428    PT Time Calculation (min) 43 min    Equipment Utilized During Treatment Gait belt    Activity Tolerance Patient tolerated treatment well;Patient limited by fatigue    Behavior During Therapy WFL for tasks assessed/performed             Past Medical History:  Diagnosis Date   AICD (automatic cardioverter/defibrillator) present    [employer]   Allergic rhinitis    Anemia    Arthritis    Chronic systolic heart failure ([place])    a. ECHO (12/2012) EF 25-30%, HK entireanteroseptal myocardium //  b.  EF 25%, diffuse HK, grade 1 diastolic dysfunction, MAC, mild LAE, normal RVSF, trivial pericardial effusion   COPD (chronic obstructive pulmonary disease) ([place])    Diabetes mellitus type II    Diabetic nephropathy ([place])    Diabetic neuropathy ([place])    ESRD on hemodialysis ([place])    started HD June 2017, goes to [facility] HD unit, Dr Hinda Lenis   History of cardiac catheterization    a.Myoview 1/15:  There is significant left ventricular dysfunction. There may be slight scar at the apex. There is no significant ischemia. LV Ejection Fraction: 27%  //  b. RHC/LHC (1/15) with mean RA 6, PA 47/22 mean 33, mean PCWP 20, PVR 2.5 WU, CI 2.5; 80% dLAD stenosis, 70% diffuse large D.     History of kidney stones    Hyperlipidemia    Hypertension    Kidney stones    NICM (nonischemic cardiomyopathy) ([place])    Primarily nonischemic.  Echo (12/14) with EF 25-30%.   Echo (3/15) with EF 25%, mild to moderately dilated LV, normal RV size and systolic function.     Osteomyelitis ([place])    left fifth ray   Pneumonia    Urethral stricture    Wears glasses     Past Surgical History:  Procedure Laterality Date   ABDOMINAL AORTOGRAM W/LOWER EXTREMITY N/A 03/30/2016   Procedure: Abdominal Aortogram w/Lower Extremity;  Surgeon: Angelia Mould, MD;  Location: [place] [facility];  Service: Cardiovascular;  Laterality: N/A;   AMPUTATION Right 04/26/2016   Procedure: Right Below Knee Amputation;  Surgeon: Newt Minion, MD;  Location: [place];  Service: Orthopedics;  Laterality: Right;   AMPUTATION Left 08/21/2019   Procedure: LEFT FOOT 5TH RAY AMPUTATION;  Surgeon: Newt Minion, MD;  Location: [place];  Service: Orthopedics;  Laterality: Left;   AMPUTATION Left 11/13/2019   Procedure: LEFT BELOW KNEE AMPUTATION;  Surgeon: Newt Minion, MD;  Location: [place];  Service: Orthopedics;  Laterality: Left;   AV FISTULA PLACEMENT Right 09/08/2015   Procedure: INSERTION OF 4-99m x 45cm  ARTERIOVENOUS (AV) GORE-TEX GRAFT RIGHT UPPER  ARM;  Surgeon: CAngelia Mould MD;  Location: [place]  Service: Vascular;  Laterality: Right;   AV FISTULA PLACEMENT Left 01/14/2016   Procedure: CREATION OF LEFT UPPER  ARM ARTERIOVENOUS FISTULA;  Surgeon: Angelia Mould, MD;  Location: [place];  Service: Vascular;  Laterality: Left;   [place] Right 08/22/2014   Procedure: RIGHT UPPER ARM [place];  Surgeon: Angelia Mould, MD;  Location: [place];  Service: Vascular;  Laterality: Right;   BELOW KNEE LEG AMPUTATION Right 04/26/2016   CARDIAC CATHETERIZATION     CARDIAC DEFIBRILLATOR PLACEMENT  06/27/2013   Sub Q       BY DR Caryl Comes   CATARACT EXTRACTION W/PHACO Right 08/06/2018   Procedure: CATARACT EXTRACTION PHACO AND INTRAOCULAR LENS PLACEMENT ([place]);  Surgeon: Baruch Goldmann, MD;  Location: [facility];  Service: Ophthalmology;  Laterality: Right;   CDE: 4.06   CATARACT EXTRACTION W/PHACO Left 08/20/2018   Procedure: CATARACT EXTRACTION PHACO AND INTRAOCULAR LENS PLACEMENT (IOC);  Surgeon: Baruch Goldmann, MD;  Location: [facility];  Service: Ophthalmology;  Laterality: Left;  CDE: 6.76   COLONOSCOPY WITH PROPOFOL N/A 07/22/2015   Procedure: COLONOSCOPY WITH PROPOFOL;  Surgeon: Doran Stabler, MD;  Location: [facility];  Service: Gastroenterology;  Laterality: N/A;   FEMORAL-POPLITEAL BYPASS GRAFT Right 03/31/2016   Procedure: BYPASS GRAFT FEMORAL-POPLITEAL ARTERY USING RIGHT GREATER SAPHENOUS NONREVERSED VEIN;  Surgeon: Angelia Mould, MD;  Location: [place];  Service: Vascular;  Laterality: Right;   HERNIA REPAIR     I & D EXTREMITY Right 03/31/2016   Procedure: IRRIGATION AND DEBRIDEMENT FOOT;  Surgeon: Angelia Mould, MD;  Location: [place];  Service: Vascular;  Laterality: Right;   IMPLANTABLE CARDIOVERTER DEFIBRILLATOR IMPLANT N/A 06/27/2013   Procedure: SUB Q ICD;  Surgeon: Deboraha Sprang, MD;  Location: [facility] C[facility] LAB;  Service: Cardiovascular;  Laterality: N/A;   INTRAOPERATIVE ARTERIOGRAM Right 03/31/2016   Procedure: INTRA OPERATIVE ARTERIOGRAM;  Surgeon: Angelia Mould, MD;  Location: [place];  Service: Vascular;  Laterality: Right;   IR GENERIC HISTORICAL Right 11/30/2015   IR THROMBECTOMY AV FISTULA W/THROMBOLYSIS/PTA INC/SHUNT/IMG RIGHT 11/30/2015 Aletta Edouard, MD [facility]   IR GENERIC HISTORICAL  11/30/2015   IR US GUIDE VASC ACCESS RIGHT 11/30/2015 Aletta Edouard, MD [facility]   IR GENERIC HISTORICAL Right 12/15/2015   IR THROMBECTOMY AV FISTULA W/THROMBOLYSIS/PTA/STENT INC/SHUNT/IMG RT 12/15/2015 Arne Cleveland, MD [facility]   IR GENERIC HISTORICAL  12/15/2015   IR US GUIDE VASC ACCESS RIGHT 12/15/2015 Arne Cleveland, MD [facility]   IR GENERIC HISTORICAL  12/28/2015   IR FLUORO GUIDE CV LINE RIGHT 12/28/2015 Marybelle Killings, MD [facility]   IR GENERIC HISTORICAL  12/28/2015   IR US GUIDE VASC ACCESS  RIGHT 12/28/2015 Marybelle Killings, MD [facility]   LEFT A ND RIGHT HEART CATH  01/30/2013   DR Sung Amabile   LEFT AND RIGHT HEART CATHETERIZATION WITH CORONARY ANGIOGRAM N/A 01/30/2013   Procedure: LEFT AND RIGHT HEART CATHETERIZATION WITH CORONARY ANGIOGRAM;  Surgeon: Jolaine Artist, MD;  Location: [facility];  Service: Cardiovascular;  Laterality: N/A;   PERIPHERAL VASCULAR CATHETERIZATION Right 01/26/2015   Procedure: A/V Fistulagram;  Surgeon: Angelia Mould, MD;  Location: [place] [facility];  Service: Cardiovascular;  Laterality: Right;   reapea urethral surgery for recurrent obstruction  2011   TOTAL KNEE ARTHROPLASTY Right 2007   VEIN HARVEST Right 03/31/2016   Procedure: RIGHT GREATER SAPHENOUS VEIN HARVEST;  Surgeon: Angelia Mould, MD;  Location: [place];  Service: Vascular;  Laterality: Right;    There were no vitals filed for this visit.   Subjective Assessment - 07/08/20 1344  Subjective he got another blister on left leg that he noticed today.    Patient is accompained by: Family member   wife, Devun Anna   Pertinent History Left TTA, automatic cardioverter/defibrillator, arthritis, CAD, cardiomyopathy, CHF, COPD, DM2, neuropathy, ESRD, CAD w/catherization, HTN, kidney stones, right TKA 2007    Patient Stated Goals Walk in home & community.    Currently in Pain? No/denies    Pain Onset 1 to 4 weeks ago                               [facility] Adult PT Treatment/Exercise - 07/08/20 1344       Transfers   Sit to Stand 5: Supervision;With upper extremity assist;With armrests;From chair/3-in-1;Other (comment)   to RW   Sit to Stand Details Visual cues for safe use of DME/AE;Verbal cues for sequencing;Verbal cues for technique    Stand to Sit 5: Supervision;With upper extremity assist;With armrests;To chair/3-in-1;Other (comment)   from RW   Stand to Sit Details (indicate cue type and reason) Visual cues for safe use of DME/AE;Verbal cues for  sequencing;Verbal cues for technique      Ambulation/Gait   Ambulation/Gait Yes    Ambulation/Gait Assistance 5: Supervision;4: Min guard   Min guard cane stand alone tip & supervision RW   Ambulation Distance (Feet) 300 Feet   300' cane & 75' x 2  no device   Assistive device Prostheses;Straight cane;None   cane stand alone tip   Stairs Yes    Stairs Assistance 5: Supervision    Stair Management Technique Two rails;Alternating pattern;Forwards    Number of Stairs 6    Height of Stairs 6    Ramp 4: Min assist   indoor ramp w/ cane   Ramp Details (indicate cue type and reason) PT verbal & tactile cues on technique with prostheses..    Curb 4: Min assist   minA w/cane & bil. TTA prostheses   Curb Details (indicate cue type and reason) PT verbal & tactile cues on technique with prostheses..      High Level Balance   High Level Balance Activities Backward walking;Side stepping;Turns;Other (comment);Negotiating over obstacles   quarter turns,  inslde //bars without UE support 7' X 3 laps ea.   High Level Balance Comments PT demo, verbal, mirror for visual & tactile cues on technique      Neuro Re-ed    Neuro Re-ed Details  standing hip width feet apart - UE resistance to facilitate core stabilization & balance - green theraband - alternating UEs & BUEs - rows, forward reach & biceps curls      Exercises   Exercises Other Exercises;Knee/Hip      Knee/Hip Exercises: Stretches   Active Hamstring Stretch Right;Left;2 reps;30 seconds    Active Hamstring Stretch Limitations supine with strap DF      Knee/Hip Exercises: Machines for Strengthening   Total Gym Leg Press shuttle leg press 125# 15 reps 1st set back 45* & 2nd set back flat single leg stance on ea LE by lifting other LE off plate for ~3KZS.      Knee/Hip Exercises: Standing   Forward Step Up Right;Left;1 set;Hand Hold: 2;5 reps;Step Height: 6"    Forward Step Up Limitations demo, tactile & verbal cues on technique    Step Down  Right;Left;1 set;Hand Hold: 2;5 reps;Step Height: 6"    Step Down Limitations demo, tactile & verbal cues on technique  Rocker Board 1 minute   ant/level/post & rt/leel/lt   Rocker Board Limitations verbal & tactile cues on technique      Knee/Hip Exercises: Seated   Sit to Sand 1 set;10 reps;without UE support   from 24" bar stool     Prosthetics   Prosthetic Care Comments  use Tegaderm on wound until it heals.    Current prosthetic wear tolerance (days/week)  daily    Current prosthetic wear tolerance (#hours/day)  most of awake hours    Edema pitting flucutating with dialysis    Residual limb condition  new superficial wound on medial femoral condyle of LLE. no signs of infection.  Covered with Tegaderm.    Education Provided Other (comment)   see prosthetic care comments   Person(s) Educated Patient    Education Method Explanation;Verbal cues    Education Method Verbalized understanding                 Balance Exercises - 07/08/20 1345       Balance Exercises: Standing   Standing Eyes Opened Wide ([place]);Head turns;5 reps;Solid surface;Foam/compliant surface   head turns right/left, up/down & diagonals,   Standing Eyes Opened Limitations tactile cues no touch of //bars today    Standing Eyes Closed Wide (BOA);5 reps;Solid surface;10 secs    Standing Eyes Closed Limitations static for 10 sec                 PT Short Term Goals - 06/24/20 1427       PT SHORT TERM GOAL #1   Title Patient verbalizes proper sweat management with prostheses.    Time 1    Period Months    Status Achieved    Target Date 06/26/20      PT SHORT TERM GOAL #2   Title Patient tolerates bilateral prostheses >90% awake hrs total /day without skin issues    Time 1    Period Months    Status Achieved    Target Date 06/26/20      PT SHORT TERM GOAL #3   Title Patient able to reach 10" without UE support safely    Baseline met 6/3, 11 inch reach    Time 1    Period Months     Status Achieved    Target Date 06/26/20      PT SHORT TERM GOAL #4   Title Patient ambulates 100' with cane & prostheses including sit / stand chair with armrests with supervision    Baseline 150 feet with supervision on 6/3    Time 1    Period Months    Status Achieved    Target Date 06/26/20      PT SHORT TERM GOAL #5   Title Patient negotiates ramps & curbs with cane & prostheses with minA.    Baseline min A for ramp, min A to descend curb but sill needs mod A to ascend curb    Time 1    Period Months    Status Partially Met    Target Date 06/26/20               PT Long Term Goals - 07/01/20 1608       PT LONG TERM GOAL #1   Title Patient demonstrates & verbalized understanding of prosthetic care to enable safe utilization of prostheses.    Time 12    Period Weeks    Status On-going    Target Date 07/23/20      PT  LONG TERM GOAL #2   Title Patient tolerates prostheses wear for >90% of awake hours without skin issues    Time 12    Period Weeks    Status On-going    Target Date 07/23/20      PT LONG TERM GOAL #3   Title Berg Balance >/= 36/56 to indicate lower fall risk    Time 12    Period Weeks    Status On-going    Target Date 07/23/20      PT LONG TERM GOAL #4   Title Patient ambulates 300' with LRAD & prostheses modified independent    Time 12    Period Weeks    Status On-going    Target Date 07/23/20      PT LONG TERM GOAL #5   Title Patient negotiates ramps, curbs & stairs with LRAD & prostheses modified independent.    Time 12    Period Weeks    Status On-going    Target Date 07/23/20                   Plan - 07/08/20 1344     Clinical Impression Statement Patient is improving strength & balance with carryover to functional activities.  He developed another wound on LLE which may be related to wrinkle in upper cut off sock worn proximally under liner or medial socket proximal trim pressing into area.    Personal Factors and  Comorbidities Comorbidity 3+;Fitness;Time since onset of injury/illness/exacerbation    Comorbidities Left TTA, automatic cardioverter/defibrillator, arthritis, CAD, cardiomyopathy, CHF, COPD, DM2, neuropathy, ESRD, CAD w/catherization, HTN, kidney stones, right TKA 2007    Examination-Activity Limitations Lift;Locomotion Level;Squat;Stairs;Stand;Transfers    Examination-Participation Restrictions Driving;Community Activity    Stability/Clinical Decision Making Evolving/Moderate complexity    Rehab Potential Good    PT Frequency 2x / week    PT Duration 12 weeks    PT Treatment/Interventions ADLs/Self Care Home Management;DME Instruction;Gait training;Stair training;Functional mobility training;Therapeutic activities;Therapeutic exercise;Balance training;Neuromuscular re-education;Patient/family education;Prosthetic Training;Vestibular    PT Next Visit Plan work towards [place],  review prosthetic care, prosthetic gait with cane including ramps & curbs, therapeutic exercise to increase functional strength & endurance.    Consulted and Agree with Plan of Care Patient             Patient will benefit from skilled therapeutic intervention in order to improve the following deficits and impairments:  Abnormal gait, Decreased activity tolerance, Decreased balance, Decreased endurance, Decreased knowledge of use of DME, Decreased mobility, Decreased skin integrity, Decreased strength, Increased edema, Impaired flexibility, Postural dysfunction, Prosthetic Dependency, Pain  Visit Diagnosis: Other abnormalities of gait and mobility  Muscle weakness (generalized)  Unsteadiness on feet     Problem List Patient Active Problem List   Diagnosis Date Noted   Malnutrition of moderate degree 11/18/2019   Below-knee amputation of left lower extremity ([place]) 11/16/2019   Abscess of left foot 11/13/2019   Dehiscence of amputation stump ([place])    History of complete ray amputation of fifth toe of left  foot ([place]) 10/09/2019   Acute pulmonary edema (HCC)    Osteomyelitis of fifth toe of left foot ([place])    Febrile illness 08/17/2019   SIRS (systemic inflammatory response syndrome) ([place]) 08/17/2019   Thrombocytopenia ([place]) 08/17/2019   Pressure injury of back, stage 2 ([place]) 08/17/2019   Hypertensive emergency 07/10/2019   Onychomycosis 08/16/2016   Osteopathy in diseases classified elsewhere, unspecified site 05/14/2016   S/P bilateral below knee amputation ([place]) 04/26/2016  Bilateral carotid bruits 03/30/2016   Diabetes mellitus with complication (HCC)    Anemia due to chronic kidney disease    ESRD on dialysis ([place]) 11/30/2015   Acute respiratory failure with hypoxia (HCC) 11/29/2015   Dependence on renal dialysis ([place]) 08/15/2015   Presence of automatic (implantable) cardiac defibrillator 03/12/2015   At high risk for falls 09/29/2014   Peripheral vascular disease ([place]) 09/29/2014   Osteoarthritis of both knees 01/07/2014   Osteoarthritis of multiple joints 10/06/2013   NICM (nonischemic cardiomyopathy) ([place]) 06/27/2013   CAD (coronary artery disease) 03/13/2013   Abnormal nuclear stress test 83/41/9622   Chronic systolic congestive heart failure ([place]) 01/27/2013   COPD (chronic obstructive pulmonary disease) ([place]) 12/27/2012   Diabetic peripheral neuropathy ([place]) 02/10/2012   Urethral stricture 10/06/2010   URINARY CALCULUS 09/28/2009   Adjustment disorder with depressed mood 08/21/2009   Poorly controlled type II diabetes mellitus with renal complication ([place]) 29/79/8921   Hyperlipidemia 04/14/2008   Essential hypertension 04/14/2008   Allergic rhinitis 04/14/2008    Jamey Reas, PT, DPT 07/08/2020, 4:44 PM  [facility] [address] [place], [place], [address] Phone: [phone]   Fax:  [phone]  Name: JAIDAN PREVETTE MRN: [id number] Date of Birth: 1960/11/02

## 2020-07-09 DIAGNOSIS — D509 Iron deficiency anemia, unspecified: Secondary | ICD-10-CM | POA: Diagnosis not present

## 2020-07-09 DIAGNOSIS — Z992 Dependence on renal dialysis: Secondary | ICD-10-CM | POA: Diagnosis not present

## 2020-07-09 DIAGNOSIS — E209 Hypoparathyroidism, unspecified: Secondary | ICD-10-CM | POA: Diagnosis not present

## 2020-07-09 DIAGNOSIS — N186 End stage renal disease: Secondary | ICD-10-CM | POA: Diagnosis not present

## 2020-07-09 DIAGNOSIS — N2581 Secondary hyperparathyroidism of renal origin: Secondary | ICD-10-CM | POA: Diagnosis not present

## 2020-07-09 DIAGNOSIS — D631 Anemia in chronic kidney disease: Secondary | ICD-10-CM | POA: Diagnosis not present

## 2020-07-11 DIAGNOSIS — N250 Renal osteodystrophy: Secondary | ICD-10-CM | POA: Diagnosis not present

## 2020-07-11 DIAGNOSIS — N186 End stage renal disease: Secondary | ICD-10-CM | POA: Diagnosis not present

## 2020-07-11 DIAGNOSIS — D509 Iron deficiency anemia, unspecified: Secondary | ICD-10-CM | POA: Diagnosis not present

## 2020-07-11 DIAGNOSIS — E559 Vitamin D deficiency, unspecified: Secondary | ICD-10-CM | POA: Diagnosis not present

## 2020-07-11 DIAGNOSIS — D631 Anemia in chronic kidney disease: Secondary | ICD-10-CM | POA: Diagnosis not present

## 2020-07-11 DIAGNOSIS — E209 Hypoparathyroidism, unspecified: Secondary | ICD-10-CM | POA: Diagnosis not present

## 2020-07-11 DIAGNOSIS — Z992 Dependence on renal dialysis: Secondary | ICD-10-CM | POA: Diagnosis not present

## 2020-07-14 DIAGNOSIS — D631 Anemia in chronic kidney disease: Secondary | ICD-10-CM | POA: Diagnosis not present

## 2020-07-14 DIAGNOSIS — Z992 Dependence on renal dialysis: Secondary | ICD-10-CM | POA: Diagnosis not present

## 2020-07-14 DIAGNOSIS — E209 Hypoparathyroidism, unspecified: Secondary | ICD-10-CM | POA: Diagnosis not present

## 2020-07-14 DIAGNOSIS — D509 Iron deficiency anemia, unspecified: Secondary | ICD-10-CM | POA: Diagnosis not present

## 2020-07-14 DIAGNOSIS — N250 Renal osteodystrophy: Secondary | ICD-10-CM | POA: Diagnosis not present

## 2020-07-14 DIAGNOSIS — N186 End stage renal disease: Secondary | ICD-10-CM | POA: Diagnosis not present

## 2020-07-15 ENCOUNTER — Other Ambulatory Visit: Payer: Self-pay

## 2020-07-15 ENCOUNTER — Encounter: Payer: Self-pay | Admitting: Physical Therapy

## 2020-07-15 ENCOUNTER — Ambulatory Visit (INDEPENDENT_AMBULATORY_CARE_PROVIDER_SITE_OTHER): Payer: Medicare Other | Admitting: Physical Therapy

## 2020-07-15 DIAGNOSIS — M6281 Muscle weakness (generalized): Secondary | ICD-10-CM | POA: Diagnosis not present

## 2020-07-15 DIAGNOSIS — R2689 Other abnormalities of gait and mobility: Secondary | ICD-10-CM | POA: Diagnosis not present

## 2020-07-15 DIAGNOSIS — R2681 Unsteadiness on feet: Secondary | ICD-10-CM | POA: Diagnosis not present

## 2020-07-15 NOTE — Therapy (Signed)
[facility] Physical [facility] [address] [place], [place], [address] Phone: [phone]   Fax:  [phone]  Physical Therapy Treatment  Patient Details  Name: Anthony Bullock MRN: [id number] Date of Birth: 04-Apr-1960 Referring Provider (PT): Bevely Palmer Persons, [place]   Encounter Date: 07/15/2020   PT End of Session - 07/15/20 1258     Visit Number 19    Number of Visits 25    Date for PT Re-Evaluation 07/23/20    Authorization Type Medicare & BCBS supplement    PT Start Time 1300    PT Stop Time 1345    PT Time Calculation (min) 45 min    Equipment Utilized During Treatment Gait belt    Activity Tolerance Patient tolerated treatment well;Patient limited by fatigue    Behavior During Therapy WFL for tasks assessed/performed             Past Medical History:  Diagnosis Date   AICD (automatic cardioverter/defibrillator) present    [employer]   Allergic rhinitis    Anemia    Arthritis    Chronic systolic heart failure ([place])    a. ECHO (12/2012) EF 25-30%, HK entireanteroseptal myocardium //  b.  EF 25%, diffuse HK, grade 1 diastolic dysfunction, MAC, mild LAE, normal RVSF, trivial pericardial effusion   COPD (chronic obstructive pulmonary disease) ([place])    Diabetes mellitus type II    Diabetic nephropathy ([place])    Diabetic neuropathy ([place])    ESRD on hemodialysis ([place])    started HD June 2017, goes to [facility] HD unit, Dr Hinda Lenis   History of cardiac catheterization    a.Myoview 1/15:  There is significant left ventricular dysfunction. There may be slight scar at the apex. There is no significant ischemia. LV Ejection Fraction: 27%  //  b. RHC/LHC (1/15) with mean RA 6, PA 47/22 mean 33, mean PCWP 20, PVR 2.5 WU, CI 2.5; 80% dLAD stenosis, 70% diffuse large D.     History of kidney stones    Hyperlipidemia    Hypertension    Kidney stones    NICM (nonischemic cardiomyopathy) ([place])    Primarily nonischemic.  Echo (12/14) with EF 25-30%.  Echo  (3/15) with EF 25%, mild to moderately dilated LV, normal RV size and systolic function.     Osteomyelitis ([place])    left fifth ray   Pneumonia    Urethral stricture    Wears glasses     Past Surgical History:  Procedure Laterality Date   ABDOMINAL AORTOGRAM W/LOWER EXTREMITY N/A 03/30/2016   Procedure: Abdominal Aortogram w/Lower Extremity;  Surgeon: Angelia Mould, MD;  Location: [place] [facility];  Service: Cardiovascular;  Laterality: N/A;   AMPUTATION Right 04/26/2016   Procedure: Right Below Knee Amputation;  Surgeon: Newt Minion, MD;  Location: [place];  Service: Orthopedics;  Laterality: Right;   AMPUTATION Left 08/21/2019   Procedure: LEFT FOOT 5TH RAY AMPUTATION;  Surgeon: Newt Minion, MD;  Location: [place];  Service: Orthopedics;  Laterality: Left;   AMPUTATION Left 11/13/2019   Procedure: LEFT BELOW KNEE AMPUTATION;  Surgeon: Newt Minion, MD;  Location: [place];  Service: Orthopedics;  Laterality: Left;   AV FISTULA PLACEMENT Right 09/08/2015   Procedure: INSERTION OF 4-4m x 45cm  ARTERIOVENOUS (AV) GORE-TEX GRAFT RIGHT UPPER  ARM;  Surgeon: CAngelia Mould MD;  Location: [place]  Service: Vascular;  Laterality: Right;   AV FISTULA PLACEMENT Left 01/14/2016   Procedure: CREATION OF LEFT UPPER  ARM ARTERIOVENOUS FISTULA;  Surgeon: Angelia Mould, MD;  Location: [place];  Service: Vascular;  Laterality: Left;   [place] Right 08/22/2014   Procedure: RIGHT UPPER ARM [place];  Surgeon: Angelia Mould, MD;  Location: [place];  Service: Vascular;  Laterality: Right;   BELOW KNEE LEG AMPUTATION Right 04/26/2016   CARDIAC CATHETERIZATION     CARDIAC DEFIBRILLATOR PLACEMENT  06/27/2013   Sub Q       BY DR Caryl Bullock   CATARACT EXTRACTION W/PHACO Right 08/06/2018   Procedure: CATARACT EXTRACTION PHACO AND INTRAOCULAR LENS PLACEMENT ([place]);  Surgeon: Baruch Goldmann, MD;  Location: [facility];  Service: Ophthalmology;  Laterality: Right;  CDE:  4.06   CATARACT EXTRACTION W/PHACO Left 08/20/2018   Procedure: CATARACT EXTRACTION PHACO AND INTRAOCULAR LENS PLACEMENT (IOC);  Surgeon: Baruch Goldmann, MD;  Location: [facility];  Service: Ophthalmology;  Laterality: Left;  CDE: 6.76   COLONOSCOPY WITH PROPOFOL N/A 07/22/2015   Procedure: COLONOSCOPY WITH PROPOFOL;  Surgeon: Doran Stabler, MD;  [facility]: [facility];  Service: Gastroenterology;  Laterality: N/A;   FEMORAL-POPLITEAL BYPASS GRAFT Right 03/31/2016   Procedure: BYPASS GRAFT FEMORAL-POPLITEAL ARTERY USING RIGHT GREATER SAPHENOUS NONREVERSED VEIN;  Surgeon: Angelia Mould, MD;  Location: [place];  Service: Vascular;  Laterality: Right;   HERNIA REPAIR     I & D EXTREMITY Right 03/31/2016   Procedure: IRRIGATION AND DEBRIDEMENT FOOT;  Surgeon: Angelia Mould, MD;  Location: [place];  Service: Vascular;  Laterality: Right;   IMPLANTABLE CARDIOVERTER DEFIBRILLATOR IMPLANT N/A 06/27/2013   Procedure: SUB Q ICD;  Surgeon: Deboraha Sprang, MD;  Location: [facility] C[facility] LAB;  Service: Cardiovascular;  Laterality: N/A;   INTRAOPERATIVE ARTERIOGRAM Right 03/31/2016   Procedure: INTRA OPERATIVE ARTERIOGRAM;  Surgeon: Angelia Mould, MD;  Location: [place];  Service: Vascular;  Laterality: Right;   IR GENERIC HISTORICAL Right 11/30/2015   IR THROMBECTOMY AV FISTULA W/THROMBOLYSIS/PTA INC/SHUNT/IMG RIGHT 11/30/2015 Aletta Edouard, MD [facility]   IR GENERIC HISTORICAL  11/30/2015   IR US GUIDE VASC ACCESS RIGHT 11/30/2015 Aletta Edouard, MD [facility]   IR GENERIC HISTORICAL Right 12/15/2015   IR THROMBECTOMY AV FISTULA W/THROMBOLYSIS/PTA/STENT INC/SHUNT/IMG RT 12/15/2015 Arne Cleveland, MD [facility]   IR GENERIC HISTORICAL  12/15/2015   IR US GUIDE VASC ACCESS RIGHT 12/15/2015 Arne Cleveland, MD [facility]   IR GENERIC HISTORICAL  12/28/2015   IR FLUORO GUIDE CV LINE RIGHT 12/28/2015 Marybelle Killings, MD [facility]   IR GENERIC HISTORICAL  12/28/2015   IR US GUIDE VASC ACCESS  RIGHT 12/28/2015 Marybelle Killings, MD [facility]   LEFT A ND RIGHT HEART CATH  01/30/2013   DR Sung Amabile   LEFT AND RIGHT HEART CATHETERIZATION WITH CORONARY ANGIOGRAM N/A 01/30/2013   Procedure: LEFT AND RIGHT HEART CATHETERIZATION WITH CORONARY ANGIOGRAM;  Surgeon: Jolaine Artist, MD;  Location: [facility] C[facility] LAB;  Service: Cardiovascular;  Laterality: N/A;   PERIPHERAL VASCULAR CATHETERIZATION Right 01/26/2015   Procedure: A/V Fistulagram;  Surgeon: Angelia Mould, MD;  Location: [place] [facility];  Service: Cardiovascular;  Laterality: Right;   reapea urethral surgery for recurrent obstruction  2011   TOTAL KNEE ARTHROPLASTY Right 2007   VEIN HARVEST Right 03/31/2016   Procedure: RIGHT GREATER SAPHENOUS VEIN HARVEST;  Surgeon: Angelia Mould, MD;  Location: [place];  Service: Vascular;  Laterality: Right;    There were no vitals filed for this visit.   Subjective Assessment - 07/15/20 1258  Subjective He drove more over weekend without issues.  He is walking in home without cane, doing dishes & washing clothes without issues.    Patient is accompained by: Family member   wife, Anthony Bullock   Pertinent History Left TTA, automatic cardioverter/defibrillator, arthritis, CAD, cardiomyopathy, CHF, COPD, DM2, neuropathy, ESRD, CAD w/catherization, HTN, kidney stones, right TKA 2007    Patient Stated Goals Walk in home & community.    Currently in Pain? No/denies    Pain Onset 1 to 4 weeks ago                               [facility] Adult PT Treatment/Exercise - 07/15/20 1259       Transfers   Sit to Stand 5: Supervision;With upper extremity assist;With armrests;From chair/3-in-1;Other (comment)   to RW   Sit to Stand Details Visual cues for safe use of DME/AE;Verbal cues for sequencing;Verbal cues for technique    Stand to Sit 5: Supervision;With upper extremity assist;With armrests;To chair/3-in-1;Other (comment)   from RW   Stand to Sit Details (indicate cue  type and reason) Visual cues for safe use of DME/AE;Verbal cues for sequencing;Verbal cues for technique      Ambulation/Gait   Ambulation/Gait Yes    Ambulation/Gait Assistance 5: Supervision;4: Min guard   Min guard cane stand alone tip & supervision RW   Ambulation Distance (Feet) 300 Feet   300' cane & 75' x 2  no device   Assistive device Prostheses;Straight cane;None   cane stand alone tip   Stairs --    Stairs Assistance --    Stair Management Technique --    Number of Stairs --    Height of Stairs --    Ramp 4: Min assist   indoor ramp w/ cane   Ramp Details (indicate cue type and reason) tactile cues for weight shift    Curb 4: Min assist   minA w/cane & bil. TTA prostheses   Curb Details (indicate cue type and reason) verbal cues on technique      High Level Balance   High Level Balance Activities Backward walking;Side stepping;Turns;Other (comment);Negotiating over obstacles;Tandem walking   quarter turns,  inslde //bars without UE support 7' X 3 laps ea.  tandem walking with one hand on bar   High Level Balance Comments tactile cues on technique & balance reactions      Neuro Re-ed    Neuro Re-ed Details  standing hip width feet apart - UE resistance to facilitate core stabilization & balance - green theraband - alternating UEs & BUEs - rows, forward reach & biceps curls      Exercises   Exercises Other Exercises;Knee/Hip      Knee/Hip Exercises: Stretches   Active Hamstring Stretch Right;Left;2 reps;30 seconds    Active Hamstring Stretch Limitations supine with strap DF      Knee/Hip Exercises: Machines for Strengthening   Total Gym Leg Press shuttle leg press 125# 15 reps 1st set back 45* & 2nd set back flat single leg stance on ea LE by lifting other LE off plate for ~8EUM.      Knee/Hip Exercises: Standing   Forward Step Up Right;Left;1 set;Hand Hold: 2;5 reps;Step Height: 6"    Forward Step Up Limitations demo, tactile & verbal cues on technique    Step Down  Right;Left;1 set;Hand Hold: 2;5 reps;Step Height: 6"    Step Down Limitations demo, tactile & verbal cues on  technique    Rocker Board 1 minute   ant/level/post & rt/leel/lt   Rocker Board Limitations visual (mirror) verbal & tactile cues on technique      Knee/Hip Exercises: Seated   Sit to Sand 1 set;10 reps;without UE support   from 24" bar stool     Prosthetics   Prosthetic Care Comments  use Tegaderm on wound until it heals.    Current prosthetic wear tolerance (days/week)  daily    Current prosthetic wear tolerance (#hours/day)  most of awake hours    Edema pitting flucutating with dialysis    Residual limb condition  new superficial wound on medial femoral condyle of LLE. no signs of infection.  Covered with Tegaderm.    Education Provided Other (comment)   see prosthetic care comments   Person(s) Educated Patient    Education Method Explanation;Verbal cues    Education Method Verbalized understanding;Verbal cues required;Needs further instruction                 Balance Exercises - 07/15/20 1300       Balance Exercises: Standing   Standing Eyes Closed Wide (BOA);5 reps;Solid surface;Head turns   head turns right/left, up/down & diagonals,   Stepping Strategy Anterior;Posterior;5 reps    Stepping Strategy Limitations anticipatory stepping off foam beam catching balance.  LLE < RLE esp posteriorly                 PT Short Term Goals - 06/24/20 1427       PT SHORT TERM GOAL #1   Title Patient verbalizes proper sweat management with prostheses.    Time 1    Period Months    Status Achieved    Target Date 06/26/20      PT SHORT TERM GOAL #2   Title Patient tolerates bilateral prostheses >90% awake hrs total /day without skin issues    Time 1    Period Months    Status Achieved    Target Date 06/26/20      PT SHORT TERM GOAL #3   Title Patient able to reach 10" without UE support safely    Baseline met 6/3, 11 inch reach    Time 1    Period Months     Status Achieved    Target Date 06/26/20      PT SHORT TERM GOAL #4   Title Patient ambulates 100' with cane & prostheses including sit / stand chair with armrests with supervision    Baseline 150 feet with supervision on 6/3    Time 1    Period Months    Status Achieved    Target Date 06/26/20      PT SHORT TERM GOAL #5   Title Patient negotiates ramps & curbs with cane & prostheses with minA.    Baseline min A for ramp, min A to descend curb but sill needs mod A to ascend curb    Time 1    Period Months    Status Partially Met    Target Date 06/26/20               PT Long Term Goals - 07/01/20 1608       PT LONG TERM GOAL #1   Title Patient demonstrates & verbalized understanding of prosthetic care to enable safe utilization of prostheses.    Time 12    Period Weeks    Status On-going    Target Date 07/23/20      PT LONG  TERM GOAL #2   Title Patient tolerates prostheses wear for >90% of awake hours without skin issues    Time 12    Period Weeks    Status On-going    Target Date 07/23/20      PT LONG TERM GOAL #3   Title Berg Balance >/= 36/56 to indicate lower fall risk    Time 12    Period Weeks    Status On-going    Target Date 07/23/20      PT LONG TERM GOAL #4   Title Patient ambulates 300' with LRAD & prostheses modified independent    Time 12    Period Weeks    Status On-going    Target Date 07/23/20      PT LONG TERM GOAL #5   Title Patient negotiates ramps, curbs & stairs with LRAD & prostheses modified independent.    Time 12    Period Weeks    Status On-going    Target Date 07/23/20                   Plan - 07/15/20 1258     Clinical Impression Statement Patient improved his ability to negotiate ramps & curbs with cane. He also is improving prosthetic gait at household level without assistive device.  PT worked on step strategy forward & backward which he improved but has difficulty with left leg.    Personal Factors and  Comorbidities Comorbidity 3+;Fitness;Time since onset of injury/illness/exacerbation    Comorbidities Left TTA, automatic cardioverter/defibrillator, arthritis, CAD, cardiomyopathy, CHF, COPD, DM2, neuropathy, ESRD, CAD w/catherization, HTN, kidney stones, right TKA 2007    Examination-Activity Limitations Lift;Locomotion Level;Squat;Stairs;Stand;Transfers    Examination-Participation Restrictions Driving;Community Activity    Stability/Clinical Decision Making Evolving/Moderate complexity    Rehab Potential Good    PT Frequency 2x / week    PT Duration 12 weeks    PT Treatment/Interventions ADLs/Self Care Home Management;DME Instruction;Gait training;Stair training;Functional mobility training;Therapeutic activities;Therapeutic exercise;Balance training;Neuromuscular re-education;Patient/family education;Prosthetic Training;Vestibular    PT Next Visit Plan work towards [place],  review prosthetic care, prosthetic gait with cane including ramps & curbs, therapeutic exercise to increase functional strength & endurance.    Consulted and Agree with Plan of Care Patient             Patient will benefit from skilled therapeutic intervention in order to improve the following deficits and impairments:  Abnormal gait, Decreased activity tolerance, Decreased balance, Decreased endurance, Decreased knowledge of use of DME, Decreased mobility, Decreased skin integrity, Decreased strength, Increased edema, Impaired flexibility, Postural dysfunction, Prosthetic Dependency, Pain  Visit Diagnosis: Muscle weakness (generalized)  Other abnormalities of gait and mobility  Unsteadiness on feet     Problem List Patient Active Problem List   Diagnosis Date Noted   Malnutrition of moderate degree 11/18/2019   Below-knee amputation of left lower extremity ([place]) 11/16/2019   Abscess of left foot 11/13/2019   Dehiscence of amputation stump ([place])    History of complete ray amputation of fifth toe of left  foot ([place]) 10/09/2019   Acute pulmonary edema (HCC)    Osteomyelitis of fifth toe of left foot ([place])    Febrile illness 08/17/2019   SIRS (systemic inflammatory response syndrome) ([place]) 08/17/2019   Thrombocytopenia ([place]) 08/17/2019   Pressure injury of back, stage 2 ([place]) 08/17/2019   Hypertensive emergency 07/10/2019   Onychomycosis 08/16/2016   Osteopathy in diseases classified elsewhere, unspecified site 05/14/2016   S/P bilateral below knee amputation ([place]) 04/26/2016  Bilateral carotid bruits 03/30/2016   Diabetes mellitus with complication (H[facility])    Anemia due to chronic kidney disease    ESRD on dialysis ([place]) 11/30/2015   Acute respiratory failure with hypoxia (HCC) 11/29/2015   Dependence on renal dialysis ([place]) 08/15/2015   Presence of automatic (implantable) cardiac defibrillator 03/12/2015   At high risk for falls 09/29/2014   Peripheral vascular disease ([place]) 09/29/2014   Osteoarthritis of both knees 01/07/2014   Osteoarthritis of multiple joints 10/06/2013   NICM (nonischemic cardiomyopathy) ([place]) 06/27/2013   CAD (coronary artery disease) 03/13/2013   Abnormal nuclear stress test 58/85/0277   Chronic systolic congestive heart failure ([place]) 01/27/2013   COPD (chronic obstructive pulmonary disease) ([place]) 12/27/2012   Diabetic peripheral neuropathy ([place]) 02/10/2012   Urethral stricture 10/06/2010   URINARY CALCULUS 09/28/2009   Adjustment disorder with depressed mood 08/21/2009   Poorly controlled type II diabetes mellitus with renal complication ([place]) 41/28/7867   Hyperlipidemia 04/14/2008   Essential hypertension 04/14/2008   Allergic rhinitis 04/14/2008    Jamey Reas, PT, DPT 07/15/2020, 2:39 PM  [facility] [address] [place], [place], [address] Phone: [phone]   Fax:  [phone]  Name: Anthony Bullock MRN: [id number] Date of Birth: [birth year]

## 2020-07-16 ENCOUNTER — Encounter (HOSPITAL_COMMUNITY): Payer: Self-pay | Admitting: *Deleted

## 2020-07-16 ENCOUNTER — Ambulatory Visit: Payer: Medicare Other

## 2020-07-16 DIAGNOSIS — E209 Hypoparathyroidism, unspecified: Secondary | ICD-10-CM | POA: Diagnosis not present

## 2020-07-16 DIAGNOSIS — Z992 Dependence on renal dialysis: Secondary | ICD-10-CM | POA: Diagnosis not present

## 2020-07-16 DIAGNOSIS — D631 Anemia in chronic kidney disease: Secondary | ICD-10-CM | POA: Diagnosis not present

## 2020-07-16 DIAGNOSIS — N250 Renal osteodystrophy: Secondary | ICD-10-CM | POA: Diagnosis not present

## 2020-07-16 DIAGNOSIS — D509 Iron deficiency anemia, unspecified: Secondary | ICD-10-CM | POA: Diagnosis not present

## 2020-07-16 DIAGNOSIS — N186 End stage renal disease: Secondary | ICD-10-CM | POA: Diagnosis not present

## 2020-07-16 NOTE — Chronic Care Management (AMB) (Signed)
  Care Management   Follow Up Note   07/16/2020 Name: Anthony Bullock MRN: [id number] DOB: [birth year]   Referred by: [name], MD Reason for referral : Care Coordination (RNCM: Initial Outreach Care coordination needs)   Successful outreach made.  Pt attends dialysis and is receiving case management services at the dialysis center. Pt verbalizes good teach back of understanding disease self management with no needs identified.  Follow Up Plan: No further follow up required: case management needs met at dialysis center  [place], [facility], CDE Care Management Coordinator [place] [phone], Mobile [phone]

## 2020-07-16 NOTE — Patient Instructions (Signed)
Visit Information  Thank you for allowing me to share the  care coordination services that are available to you as part of your health plan and services through your primary care provider and medical home. Please reach out to me at [phone] if the care management/care coordination team may be of assistance to you in the future.   Peter Garter RN, Jackquline [place], CDE Care Management Coordinator [facility] [phone], Mobile [phone]

## 2020-07-18 DIAGNOSIS — D631 Anemia in chronic kidney disease: Secondary | ICD-10-CM | POA: Diagnosis not present

## 2020-07-18 DIAGNOSIS — E209 Hypoparathyroidism, unspecified: Secondary | ICD-10-CM | POA: Diagnosis not present

## 2020-07-18 DIAGNOSIS — Z992 Dependence on renal dialysis: Secondary | ICD-10-CM | POA: Diagnosis not present

## 2020-07-18 DIAGNOSIS — N186 End stage renal disease: Secondary | ICD-10-CM | POA: Diagnosis not present

## 2020-07-18 DIAGNOSIS — D509 Iron deficiency anemia, unspecified: Secondary | ICD-10-CM | POA: Diagnosis not present

## 2020-07-18 DIAGNOSIS — N250 Renal osteodystrophy: Secondary | ICD-10-CM | POA: Diagnosis not present

## 2020-07-20 ENCOUNTER — Encounter: Payer: Self-pay | Admitting: Physical Therapy

## 2020-07-20 ENCOUNTER — Ambulatory Visit (INDEPENDENT_AMBULATORY_CARE_PROVIDER_SITE_OTHER): Payer: Medicare Other | Admitting: Physical Therapy

## 2020-07-20 ENCOUNTER — Other Ambulatory Visit: Payer: Self-pay

## 2020-07-20 DIAGNOSIS — R2681 Unsteadiness on feet: Secondary | ICD-10-CM | POA: Diagnosis not present

## 2020-07-20 DIAGNOSIS — R2689 Other abnormalities of gait and mobility: Secondary | ICD-10-CM

## 2020-07-20 DIAGNOSIS — M6281 Muscle weakness (generalized): Secondary | ICD-10-CM

## 2020-07-20 NOTE — Therapy (Signed)
[facility] [address] [place], [place], [address] Phone: [phone]   Fax:  [phone]  Physical Therapy Treatment & 10th Visit Progress Note  Patient Details  Name: Anthony Bullock MRN: [id number] Date of Birth: 1960/10/31 Referring Provider (PT): Bevely Palmer Persons, [place]   Encounter Date: 07/20/2020  Progress Note Reporting Period 06/15/2020 to 07/20/2020  See note below for Objective Data and Assessment of Progress/Goals.       PT End of Session - 07/20/20 1256     Visit Number 20    Number of Visits 25    Date for PT Re-Evaluation 07/23/20    Authorization Type Medicare & BCBS supplement    PT Start Time 1256    PT Stop Time 1341    PT Time Calculation (min) 45 min    Equipment Utilized During Treatment Gait belt    Activity Tolerance Patient tolerated treatment well;Patient limited by fatigue    Behavior During Therapy WFL for tasks assessed/performed             Past Medical History:  Diagnosis Date   AICD (automatic cardioverter/defibrillator) present    [employer]   Allergic rhinitis    Anemia    Arthritis    Chronic systolic heart failure ([place])    a. ECHO (12/2012) EF 25-30%, HK entireanteroseptal myocardium //  b.  EF 25%, diffuse HK, grade 1 diastolic dysfunction, MAC, mild LAE, normal RVSF, trivial pericardial effusion   COPD (chronic obstructive pulmonary disease) ([place])    Diabetes mellitus type II    Diabetic nephropathy ([place])    Diabetic neuropathy ([place])    ESRD on hemodialysis ([place])    started HD June 2017, goes to [facility] unit, Dr Hinda Lenis   History of cardiac catheterization    a.Myoview 1/15:  There is significant left ventricular dysfunction. There may be slight scar at the apex. There is no significant ischemia. LV Ejection Fraction: 27%  //  b. RHC/LHC (1/15) with mean RA 6, PA 47/22 mean 33, mean PCWP 20, PVR 2.5 WU, CI 2.5; 80% dLAD stenosis, 70% diffuse large D.     History of kidney  stones    Hyperlipidemia    Hypertension    Kidney stones    NICM (nonischemic cardiomyopathy) ([place])    Primarily nonischemic.  Echo (12/14) with EF 25-30%.  Echo (3/15) with EF 25%, mild to moderately dilated LV, normal RV size and systolic function.     Osteomyelitis ([place])    left fifth ray   Pneumonia    Urethral stricture    Wears glasses     Past Surgical History:  Procedure Laterality Date   ABDOMINAL AORTOGRAM W/LOWER EXTREMITY N/A 03/30/2016   Procedure: Abdominal Aortogram w/Lower Extremity;  Surgeon: Angelia Mould, MD;  Location: [place] [facility];  Service: Cardiovascular;  Laterality: N/A;   AMPUTATION Right 04/26/2016   Procedure: Right Below Knee Amputation;  Surgeon: Newt Minion, MD;  Location: [place];  Service: Orthopedics;  Laterality: Right;   AMPUTATION Left 08/21/2019   Procedure: LEFT FOOT 5TH RAY AMPUTATION;  Surgeon: Newt Minion, MD;  Location: [place];  Service: Orthopedics;  Laterality: Left;   AMPUTATION Left 11/13/2019   Procedure: LEFT BELOW KNEE AMPUTATION;  Surgeon: Newt Minion, MD;  Location: [place];  Service: Orthopedics;  Laterality: Left;   AV FISTULA PLACEMENT Right 09/08/2015   Procedure: INSERTION OF 4-27m x 45cm  ARTERIOVENOUS (AV) GORE-TEX GRAFT RIGHT UPPER  ARM;  Surgeon:  Angelia Mould, MD;  Location: [place];  Service: Vascular;  Laterality: Right;   AV FISTULA PLACEMENT Left 01/14/2016   Procedure: CREATION OF LEFT UPPER ARM ARTERIOVENOUS FISTULA;  Surgeon: Angelia Mould, MD;  Location: [place];  Service: Vascular;  Laterality: Left;   [place] Right 08/22/2014   Procedure: RIGHT UPPER ARM [place];  Surgeon: Angelia Mould, MD;  Location: [place];  Service: Vascular;  Laterality: Right;   BELOW KNEE LEG AMPUTATION Right 04/26/2016   CARDIAC CATHETERIZATION     CARDIAC DEFIBRILLATOR PLACEMENT  06/27/2013   Sub Q       BY DR Caryl Comes   CATARACT EXTRACTION W/PHACO Right 08/06/2018    Procedure: CATARACT EXTRACTION PHACO AND INTRAOCULAR LENS PLACEMENT ([place]);  Surgeon: Baruch Goldmann, MD;  Location: [facility];  Service: Ophthalmology;  Laterality: Right;  CDE: 4.06   CATARACT EXTRACTION W/PHACO Left 08/20/2018   Procedure: CATARACT EXTRACTION PHACO AND INTRAOCULAR LENS PLACEMENT (I[facility]);  Surgeon: Baruch Goldmann, MD;  Location: [facility];  Service: Ophthalmology;  Laterality: Left;  CDE: 6.76   COLONOSCOPY WITH PROPOFOL N/A 07/22/2015   Procedure: COLONOSCOPY WITH PROPOFOL;  Surgeon: Doran Stabler, MD;  Location: [facility];  Service: Gastroenterology;  Laterality: N/A;   FEMORAL-POPLITEAL BYPASS GRAFT Right 03/31/2016   Procedure: BYPASS GRAFT FEMORAL-POPLITEAL ARTERY USING RIGHT GREATER SAPHENOUS NONREVERSED VEIN;  Surgeon: Angelia Mould, MD;  Location: [place];  Service: Vascular;  Laterality: Right;   HERNIA REPAIR     I & D EXTREMITY Right 03/31/2016   Procedure: IRRIGATION AND DEBRIDEMENT FOOT;  Surgeon: Angelia Mould, MD;  Location: [place];  Service: Vascular;  Laterality: Right;   IMPLANTABLE CARDIOVERTER DEFIBRILLATOR IMPLANT N/A 06/27/2013   Procedure: SUB Q ICD;  Surgeon: Deboraha Sprang, MD;  Location: [facility];  Service: Cardiovascular;  Laterality: N/A;   INTRAOPERATIVE ARTERIOGRAM Right 03/31/2016   Procedure: INTRA OPERATIVE ARTERIOGRAM;  Surgeon: Angelia Mould, MD;  Location: [place];  Service: Vascular;  Laterality: Right;   IR GENERIC HISTORICAL Right 11/30/2015   IR THROMBECTOMY AV FISTULA W/THROMBOLYSIS/PTA INC/SHUNT/IMG RIGHT 11/30/2015 Aletta Edouard, MD [facility]   IR GENERIC HISTORICAL  11/30/2015   IR US GUIDE VASC ACCESS RIGHT 11/30/2015 Aletta Edouard, MD [facility]   IR GENERIC HISTORICAL Right 12/15/2015   IR THROMBECTOMY AV FISTULA W/THROMBOLYSIS/PTA/STENT INC/SHUNT/IMG RT 12/15/2015 Arne Cleveland, MD [facility]   IR GENERIC HISTORICAL  12/15/2015   IR US GUIDE VASC ACCESS RIGHT 12/15/2015 Arne Cleveland, MD [facility]    IR GENERIC HISTORICAL  12/28/2015   IR FLUORO GUIDE CV LINE RIGHT 12/28/2015 Marybelle Killings, MD [facility]   IR GENERIC HISTORICAL  12/28/2015   IR US GUIDE VASC ACCESS RIGHT 12/28/2015 Marybelle Killings, MD [facility]   LEFT A ND RIGHT HEART CATH  01/30/2013   DR Sung Amabile   LEFT AND RIGHT HEART CATHETERIZATION WITH CORONARY ANGIOGRAM N/A 01/30/2013   Procedure: LEFT AND RIGHT HEART CATHETERIZATION WITH CORONARY ANGIOGRAM;  Surgeon: Jolaine Artist, MD;  Location: [facility];  Service: Cardiovascular;  Laterality: N/A;   PERIPHERAL VASCULAR CATHETERIZATION Right 01/26/2015   Procedure: A/V Fistulagram;  Surgeon: Angelia Mould, MD;  Location: [place] [facility];  Service: Cardiovascular;  Laterality: Right;   reapea urethral surgery for recurrent obstruction  2011   TOTAL KNEE ARTHROPLASTY Right 2007   VEIN HARVEST Right 03/31/2016   Procedure: RIGHT GREATER SAPHENOUS VEIN HARVEST;  Surgeon: Angelia Mould, MD;  Location:  [facility];  Service: Vascular;  Laterality: Right;    There were no vitals filed for this visit.   Subjective Assessment - 07/20/20 1257     Subjective He mowed yard on Saturday with riding mower with RLE gas/forward/ reverse & LLE clutch/brake for ~2 hrs. He noticed another blister on on inside of right knee.    Patient is accompained by: Family member   wife, Jayveon Convey   Pertinent History Left TTA, automatic cardioverter/defibrillator, arthritis, CAD, cardiomyopathy, CHF, COPD, DM2, neuropathy, ESRD, CAD w/catherization, HTN, kidney stones, right TKA 2007    Patient Stated Goals Walk in home & community.    Currently in Pain? No/denies    Pain Onset 1 to 4 weeks ago                [facility] PT Assessment - 07/20/20 1257       Assessment   Medical Diagnosis Bilateral Transtibial Amputations    Referring Provider (PT) Bevely Palmer Persons, PA      Ambulation/Gait   Ramp 5: Supervision   indoor ramp w/ cane   Curb 5: Supervision   w/cane & bil. TTA  prostheses     Standardized Balance Assessment   Standardized Balance Assessment Berg Balance Test      Berg Balance Test   Sit to Stand Able to stand  independently using hands    Standing Unsupported Able to stand safely 2 minutes    Sitting with Back Unsupported but Feet Supported on Floor or Stool Able to sit safely and securely 2 minutes    Stand to Sit Controls descent by using hands    Transfers Able to transfer safely, minor use of hands    Standing Unsupported with Eyes Closed Able to stand 10 seconds with supervision    Standing Unsupported with Feet Together Able to place feet together independently and stand for 1 minute with supervision    From Standing, Reach Forward with Outstretched Arm Can reach forward >12 cm safely (5")    From Standing Position, Pick up Object from Welcome to pick up shoe safely and easily    From Standing Position, Turn to Look Behind Over each Shoulder Turn sideways only but maintains balance    Turn 360 Degrees Able to turn 360 degrees safely but slowly    Standing Unsupported, Alternately Place Feet on Step/Stool Able to complete >2 steps/needs minimal assist    Standing Unsupported, One Foot in Front Able to take small step independently and hold 30 seconds    Standing on One Leg Unable to try or needs assist to prevent fall    Total Score 38    Berg comment: BERG  < 36 high risk for falls (close to 100%) 46-51 moderate (>50%)   37-45 significant (>80%) 52-55 lower (> 25%)                           [facility] Adult PT Treatment/Exercise - 07/20/20 1257       Transfers   Sit to Stand 5: Supervision;With upper extremity assist;With armrests;From chair/3-in-1;Other (comment)   to RW   Sit to Stand Details Visual cues for safe use of DME/AE;Verbal cues for sequencing;Verbal cues for technique    Stand to Sit 5: Supervision;With upper extremity assist;With armrests;To chair/3-in-1;Other (comment)   from RW   Stand to Sit Details  (indicate cue type and reason) Visual cues for safe use of DME/AE;Verbal cues for sequencing;Verbal cues for  technique      Ambulation/Gait   Ambulation/Gait Yes    Ambulation/Gait Assistance 5: Supervision;4: Min guard   Min guard cane stand alone tip & supervision RW   Ambulation/Gait Assistance Details worked on scanning while ambulating maintaining path.    Ambulation Distance (Feet) 300 Feet   300' cane & 75' x 2  no device   Assistive device Prostheses;Straight cane;None   cane stand alone tip     High Level Balance   High Level Balance Activities Backward walking;Side stepping;Turns;Other (comment);Negotiating over obstacles;Tandem walking   quarter turns,  inslde //bars without UE support 7' X 3 laps ea.  tandem walking with one hand on bar   High Level Balance Comments tactile cues on technique & balance reactions      Neuro Re-ed    Neuro Re-ed Details  --      Exercises   Exercises Other Exercises;Knee/Hip      Knee/Hip Exercises: Stretches   Active Hamstring Stretch Right;Left;2 reps;30 seconds    Active Hamstring Stretch Limitations supine with strap DF      Knee/Hip Exercises: Machines for Strengthening   Total Gym Leg Press shuttle leg press 125# 15 reps 1st set back 45* & 2nd set back flat single leg stance on ea LE by lifting other LE off plate for ~9TOI.      Knee/Hip Exercises: Standing   Forward Step Up Right;Left;1 set;Hand Hold: 2;5 reps;Step Height: 4"    Forward Step Up Limitations PT demo & verbal cues on how to set up with half cinder block at home    Step Down Right;Left;1 set;Hand Hold: 2;5 reps;Step Height: 4"    Step Down Limitations PT demo & verbal cues on how to set up with half cinder block at home    Rocker Board 1 minute   ant/level/post & rt/leel/lt   Rocker Board Limitations visual (mirror) verbal & tactile cues on technique      Knee/Hip Exercises: Seated   Sit to Sand 1 set;10 reps;without UE support   from 24" bar stool     Prosthetics    Prosthetic Care Comments  use Tegaderm on wound until it heals.  He has appt with Staci Righter, CPO at 2:00 today.  PT demo & verbal cues that wound location indicates sitting or using prosthesis with LE ext rot.  PT also demo & verbal cues on donning with pin tilted back slightly so will displace soft tissue towards his distal tibia.    Current prosthetic wear tolerance (days/week)  daily    Current prosthetic wear tolerance (#hours/day)  most of awake hours    Edema pitting flucutating with dialysis    Residual limb condition  new superficial wound on medial femoral condyle of LLE. no signs of infection.  Covered with Tegaderm.    Education Provided Other (comment)   see prosthetic care comments   Person(s) Educated Patient    Education Method Explanation;Verbal cues    Education Method Verbalized understanding                      PT Short Term Goals - 06/24/20 1427       PT SHORT TERM GOAL #1   Title Patient verbalizes proper sweat management with prostheses.    Time 1    Period Months    Status Achieved    Target Date 06/26/20      PT SHORT TERM GOAL #2   Title Patient tolerates bilateral prostheses >90%  awake hrs total /day without skin issues    Time 1    Period Months    Status Achieved    Target Date 06/26/20      PT SHORT TERM GOAL #3   Title Patient able to reach 10" without UE support safely    Baseline met 6/3, 11 inch reach    Time 1    Period Months    Status Achieved    Target Date 06/26/20      PT SHORT TERM GOAL #4   Title Patient ambulates 100' with cane & prostheses including sit / stand chair with armrests with supervision    Baseline 150 feet with supervision on 6/3    Time 1    Period Months    Status Achieved    Target Date 06/26/20      PT SHORT TERM GOAL #5   Title Patient negotiates ramps & curbs with cane & prostheses with minA.    Baseline min A for ramp, min A to descend curb but sill needs mod A to ascend curb    Time 1     Period Months    Status Partially Met    Target Date 06/26/20               PT Long Term Goals - 07/20/20 1350       PT LONG TERM GOAL #1   Title Patient demonstrates & verbalized understanding of prosthetic care to enable safe utilization of prostheses.    Time 12    Period Weeks    Status On-going      PT LONG TERM GOAL #2   Title Patient tolerates prostheses wear for >90% of awake hours without skin issues    Time 12    Period Weeks    Status On-going      PT LONG TERM GOAL #3   Title Berg Balance >/= 36/56 to indicate lower fall risk    Baseline 7/11/022 Merrilee Jansky 38/56    Time 12    Period Weeks    Status Achieved      PT LONG TERM GOAL #4   Title Patient ambulates 300' with LRAD & prostheses modified independent    Time 12    Period Weeks    Status On-going      PT LONG TERM GOAL #5   Title Patient negotiates ramps, curbs & stairs with LRAD & prostheses modified independent.    Time 12    Period Weeks    Status On-going                   Plan - 07/20/20 1256     Clinical Impression Statement Patient has another wound on medial femoral condyle which appears to be from proximal medial socket rubbing area when he uses prosthesis with external rotation of limb.  No signs of infection noted.  Berg Balance 38 / 56 indicates fall risk but improvement overall in his balance.    Personal Factors and Comorbidities Comorbidity 3+;Fitness;Time since onset of injury/illness/exacerbation    Comorbidities Left TTA, automatic cardioverter/defibrillator, arthritis, CAD, cardiomyopathy, CHF, COPD, DM2, neuropathy, ESRD, CAD w/catherization, HTN, kidney stones, right TKA 2007    Examination-Activity Limitations Lift;Locomotion Level;Squat;Stairs;Stand;Transfers    Examination-Participation Restrictions Driving;Community Activity    Stability/Clinical Decision Making Evolving/Moderate complexity    Rehab Potential Good    PT Frequency 2x / week    PT Duration 12 weeks     PT Treatment/Interventions ADLs/Self Care Home Management;DME  Instruction;Gait training;Stair training;Functional mobility training;Therapeutic activities;Therapeutic exercise;Balance training;Neuromuscular re-education;Patient/family education;Prosthetic Training;Vestibular    PT Next Visit Plan check remaining LTGs & discharge    Consulted and Agree with Plan of Care Patient             Patient will benefit from skilled therapeutic intervention in order to improve the following deficits and impairments:  Abnormal gait, Decreased activity tolerance, Decreased balance, Decreased endurance, Decreased knowledge of use of DME, Decreased mobility, Decreased skin integrity, Decreased strength, Increased edema, Impaired flexibility, Postural dysfunction, Prosthetic Dependency, Pain  Visit Diagnosis: Muscle weakness (generalized)  Other abnormalities of gait and mobility  Unsteadiness on feet     Problem List Patient Active Problem List   Diagnosis Date Noted   Malnutrition of moderate degree 11/18/2019   Below-knee amputation of left lower extremity ([place]) 11/16/2019   Abscess of left foot 11/13/2019   Dehiscence of amputation stump ([place])    History of complete ray amputation of fifth toe of left foot ([place]) 10/09/2019   Acute pulmonary edema ([facility])    Osteomyelitis of fifth toe of left foot ([place])    Febrile illness 08/17/2019   SIRS (systemic inflammatory response syndrome) ([place]) 08/17/2019   Thrombocytopenia ([place]) 08/17/2019   Pressure injury of back, stage 2 ([place]) 08/17/2019   Hypertensive emergency 07/10/2019   Onychomycosis 08/16/2016   Osteopathy in diseases classified elsewhere, unspecified site 05/14/2016   S/P bilateral below knee amputation ([place]) 04/26/2016   Bilateral carotid bruits 03/30/2016   Diabetes mellitus with complication ([place])    Anemia due to chronic kidney disease    ESRD on dialysis ([place]) 11/30/2015   Acute respiratory failure with hypoxia ([place])  11/29/2015   Dependence on renal dialysis ([place]) 08/15/2015   Presence of automatic (implantable) cardiac defibrillator 03/12/2015   At high risk for falls 09/29/2014   Peripheral vascular disease ([place]) 09/29/2014   Osteoarthritis of both knees 01/07/2014   Osteoarthritis of multiple joints 10/06/2013   NICM (nonischemic cardiomyopathy) ([place]) 06/27/2013   CAD (coronary artery disease) 03/13/2013   Abnormal nuclear stress test 24/58/0998   Chronic systolic congestive heart failure ([place]) 01/27/2013   COPD (chronic obstructive pulmonary disease) ([place]) 12/27/2012   Diabetic peripheral neuropathy ([place]) 02/10/2012   Urethral stricture 10/06/2010   URINARY CALCULUS 09/28/2009   Adjustment disorder with depressed mood 08/21/2009   Poorly controlled type II diabetes mellitus with renal complication ([place]) 33/82/5053   Hyperlipidemia 04/14/2008   Essential hypertension 04/14/2008   Allergic rhinitis 04/14/2008    Jamey Reas, PT, DPT 07/20/2020, 1:53 PM  [facility] [address] [place], [place], [address] Phone: [phone]   Fax:  [phone]  Name: Anthony Bullock MRN: [id number] Date of Birth: May 16, 1960

## 2020-07-21 DIAGNOSIS — N250 Renal osteodystrophy: Secondary | ICD-10-CM | POA: Diagnosis not present

## 2020-07-21 DIAGNOSIS — E209 Hypoparathyroidism, unspecified: Secondary | ICD-10-CM | POA: Diagnosis not present

## 2020-07-21 DIAGNOSIS — Z992 Dependence on renal dialysis: Secondary | ICD-10-CM | POA: Diagnosis not present

## 2020-07-21 DIAGNOSIS — D631 Anemia in chronic kidney disease: Secondary | ICD-10-CM | POA: Diagnosis not present

## 2020-07-21 DIAGNOSIS — N186 End stage renal disease: Secondary | ICD-10-CM | POA: Diagnosis not present

## 2020-07-21 DIAGNOSIS — D509 Iron deficiency anemia, unspecified: Secondary | ICD-10-CM | POA: Diagnosis not present

## 2020-07-22 ENCOUNTER — Encounter: Payer: Medicare Other | Admitting: Physical Therapy

## 2020-07-23 DIAGNOSIS — N250 Renal osteodystrophy: Secondary | ICD-10-CM | POA: Diagnosis not present

## 2020-07-23 DIAGNOSIS — E209 Hypoparathyroidism, unspecified: Secondary | ICD-10-CM | POA: Diagnosis not present

## 2020-07-23 DIAGNOSIS — D631 Anemia in chronic kidney disease: Secondary | ICD-10-CM | POA: Diagnosis not present

## 2020-07-23 DIAGNOSIS — N186 End stage renal disease: Secondary | ICD-10-CM | POA: Diagnosis not present

## 2020-07-23 DIAGNOSIS — D509 Iron deficiency anemia, unspecified: Secondary | ICD-10-CM | POA: Diagnosis not present

## 2020-07-23 DIAGNOSIS — Z992 Dependence on renal dialysis: Secondary | ICD-10-CM | POA: Diagnosis not present

## 2020-07-25 DIAGNOSIS — N250 Renal osteodystrophy: Secondary | ICD-10-CM | POA: Diagnosis not present

## 2020-07-25 DIAGNOSIS — D631 Anemia in chronic kidney disease: Secondary | ICD-10-CM | POA: Diagnosis not present

## 2020-07-25 DIAGNOSIS — Z992 Dependence on renal dialysis: Secondary | ICD-10-CM | POA: Diagnosis not present

## 2020-07-25 DIAGNOSIS — D509 Iron deficiency anemia, unspecified: Secondary | ICD-10-CM | POA: Diagnosis not present

## 2020-07-25 DIAGNOSIS — N186 End stage renal disease: Secondary | ICD-10-CM | POA: Diagnosis not present

## 2020-07-25 DIAGNOSIS — E209 Hypoparathyroidism, unspecified: Secondary | ICD-10-CM | POA: Diagnosis not present

## 2020-07-27 ENCOUNTER — Encounter: Payer: Medicare Other | Admitting: Physical Therapy

## 2020-07-28 DIAGNOSIS — D509 Iron deficiency anemia, unspecified: Secondary | ICD-10-CM | POA: Diagnosis not present

## 2020-07-28 DIAGNOSIS — Z992 Dependence on renal dialysis: Secondary | ICD-10-CM | POA: Diagnosis not present

## 2020-07-28 DIAGNOSIS — E209 Hypoparathyroidism, unspecified: Secondary | ICD-10-CM | POA: Diagnosis not present

## 2020-07-28 DIAGNOSIS — D631 Anemia in chronic kidney disease: Secondary | ICD-10-CM | POA: Diagnosis not present

## 2020-07-28 DIAGNOSIS — N250 Renal osteodystrophy: Secondary | ICD-10-CM | POA: Diagnosis not present

## 2020-07-28 DIAGNOSIS — N186 End stage renal disease: Secondary | ICD-10-CM | POA: Diagnosis not present

## 2020-07-29 ENCOUNTER — Ambulatory Visit (INDEPENDENT_AMBULATORY_CARE_PROVIDER_SITE_OTHER): Payer: Medicare Other | Admitting: Physical Therapy

## 2020-07-29 ENCOUNTER — Encounter: Payer: Self-pay | Admitting: Physical Therapy

## 2020-07-29 ENCOUNTER — Other Ambulatory Visit: Payer: Self-pay

## 2020-07-29 DIAGNOSIS — M6281 Muscle weakness (generalized): Secondary | ICD-10-CM

## 2020-07-29 DIAGNOSIS — R2681 Unsteadiness on feet: Secondary | ICD-10-CM | POA: Diagnosis not present

## 2020-07-29 DIAGNOSIS — R2689 Other abnormalities of gait and mobility: Secondary | ICD-10-CM

## 2020-07-29 NOTE — Therapy (Signed)
[facility] [address] [place], [place], [address] Phone: [phone]   Fax:  [phone]  Physical Therapy Treatment & Discharge Summary  Patient Details  Name: Anthony Bullock MRN: [id number] Date of Birth: 11-20-1960 Referring Provider (PT): Bevely Palmer Persons, [place]   Encounter Date: 07/29/2020  PHYSICAL THERAPY DISCHARGE SUMMARY  Visits from Start of Care: 21  Current functional level related to goals / functional outcomes: See below   Remaining deficits: See below   Education / Equipment: Prosthetic care & HEP   Patient agrees to discharge. Patient goals were met. Patient is being discharged due to meeting the stated rehab goals.    PT End of Session - 07/29/20 1149     Visit Number 21    Number of Visits 25    Date for PT Re-Evaluation 07/23/20    Authorization Type Medicare & BCBS supplement    PT Start Time 1145    PT Stop Time 1214    PT Time Calculation (min) 29 min    Equipment Utilized During Treatment Gait belt    Activity Tolerance Patient tolerated treatment well;Patient limited by fatigue    Behavior During Therapy WFL for tasks assessed/performed             Past Medical History:  Diagnosis Date   AICD (automatic cardioverter/defibrillator) present    [employer]   Allergic rhinitis    Anemia    Arthritis    Chronic systolic heart failure ([place])    a. ECHO (12/2012) EF 25-30%, HK entireanteroseptal myocardium //  b.  EF 25%, diffuse HK, grade 1 diastolic dysfunction, MAC, mild LAE, normal RVSF, trivial pericardial effusion   COPD (chronic obstructive pulmonary disease) ([place])    Diabetes mellitus type II    Diabetic nephropathy ([place])    Diabetic neuropathy ([place])    ESRD on hemodialysis ([place])    started HD June 2017, goes to [facility] HD unit, Dr Hinda Lenis   History of cardiac catheterization    a.Myoview 1/15:  There is significant left ventricular dysfunction. There may be slight scar at the apex.  There is no significant ischemia. LV Ejection Fraction: 27%  //  b. RHC/LHC (1/15) with mean RA 6, PA 47/22 mean 33, mean PCWP 20, PVR 2.5 WU, CI 2.5; 80% dLAD stenosis, 70% diffuse large D.     History of kidney stones    Hyperlipidemia    Hypertension    Kidney stones    NICM (nonischemic cardiomyopathy) ([place])    Primarily nonischemic.  Echo (12/14) with EF 25-30%.  Echo (3/15) with EF 25%, mild to moderately dilated LV, normal RV size and systolic function.     Osteomyelitis ([place])    left fifth ray   Pneumonia    Urethral stricture    Wears glasses     Past Surgical History:  Procedure Laterality Date   ABDOMINAL AORTOGRAM W/LOWER EXTREMITY N/A 03/30/2016   Procedure: Abdominal Aortogram w/Lower Extremity;  Surgeon: Angelia Mould, MD;  Location: [facility];  Service: Cardiovascular;  Laterality: N/A;   AMPUTATION Right 04/26/2016   Procedure: Right Below Knee Amputation;  Surgeon: Newt Minion, MD;  Location: [place];  Service: Orthopedics;  Laterality: Right;   AMPUTATION Left 08/21/2019   Procedure: LEFT FOOT 5TH RAY AMPUTATION;  Surgeon: Newt Minion, MD;  Location: [place];  Service: Orthopedics;  Laterality: Left;   AMPUTATION Left 11/13/2019   Procedure: LEFT BELOW KNEE AMPUTATION;  Surgeon: Newt Minion, MD;  Location: [place];  Service: Orthopedics;  Laterality: Left;   AV FISTULA PLACEMENT Right 09/08/2015   Procedure: INSERTION OF 4-77m x 45cm  ARTERIOVENOUS (AV) GORE-TEX GRAFT RIGHT UPPER  ARM;  Surgeon: CAngelia Mould MD;  [place]  Service: Vascular;  Laterality: Right;   AV FISTULA PLACEMENT Left 01/14/2016   Procedure: CREATION OF LEFT UPPER ARM ARTERIOVENOUS FISTULA;  Surgeon: CAngelia Mould MD;  [place]  Service: Vascular;  Laterality: Left;   BLindRight 08/22/2014   Procedure: RIGHT UPPER ARM [place]  Surgeon: CAngelia Mould MD;  [place]  Service: Vascular;   Laterality: Right;   BELOW KNEE LEG AMPUTATION Right 04/26/2016   CARDIAC CATHETERIZATION     CARDIAC DEFIBRILLATOR PLACEMENT  06/27/2013   Sub Q       BY DR KCaryl Comes  CATARACT EXTRACTION W/PHACO Right 08/06/2018   Procedure: CATARACT EXTRACTION PHACO AND INTRAOCULAR LENS PLACEMENT ([place];  Surgeon: WBaruch Goldmann MD;  [facility];  Service: Ophthalmology;  Laterality: Right;  CDE: 4.06   CATARACT EXTRACTION W/PHACO Left 08/20/2018   Procedure: CATARACT EXTRACTION PHACO AND INTRAOCULAR LENS PLACEMENT (IOC);  Surgeon: WBaruch Goldmann MD;  Location: [facility];  Service: Ophthalmology;  Laterality: Left;  CDE: 6.76   COLONOSCOPY WITH PROPOFOL N/A 07/22/2015   Procedure: COLONOSCOPY WITH PROPOFOL;  Surgeon: HDoran Stabler MD;  [facility];  Service: Gastroenterology;  Laterality: N/A;   FEMORAL-POPLITEAL BYPASS GRAFT Right 03/31/2016   Procedure: BYPASS GRAFT FEMORAL-POPLITEAL ARTERY USING RIGHT GREATER SAPHENOUS NONREVERSED VEIN;  Surgeon: CAngelia Mould MD;  [place]  Service: Vascular;  Laterality: Right;   HERNIA REPAIR     I & D EXTREMITY Right 03/31/2016   Procedure: IRRIGATION AND DEBRIDEMENT FOOT;  Surgeon: CAngelia Mould MD;  [place]  Service: Vascular;  Laterality: Right;   IMPLANTABLE CARDIOVERTER DEFIBRILLATOR IMPLANT N/A 06/27/2013   Procedure: SUB Q ICD;  Surgeon: SDeboraha Sprang MD;  [facility];  Service: Cardiovascular;  Laterality: N/A;   INTRAOPERATIVE ARTERIOGRAM Right 03/31/2016   Procedure: INTRA OPERATIVE ARTERIOGRAM;  Surgeon: CAngelia Mould MD;  [place]  Service: Vascular;  Laterality: Right;   IR GENERIC HISTORICAL Right 11/30/2015   IR THROMBECTOMY AV FISTULA W/THROMBOLYSIS/PTA INC/SHUNT/IMG RIGHT 11/30/2015 GAletta Edouard MD [facility]   IR GENERIC HISTORICAL  11/30/2015   IR [place] ACCESS RIGHT 11/30/2015 GAletta Edouard MD [facility]   IR GENERIC HISTORICAL Right 12/15/2015   IR  THROMBECTOMY AV FISTULA W/THROMBOLYSIS/PTA/STENT INC/SHUNT/IMG RT 12/15/2015 DArne Cleveland MD [facility]   IR GENERIC HISTORICAL  12/15/2015   IR [place] VASC ACCESS RIGHT 12/15/2015 DArne Cleveland MD [facility]   IR GENERIC HISTORICAL  12/28/2015   IR FLUORO GUIDE CV LINE RIGHT 12/28/2015 AMarybelle Killings MD [facility]   IR GENERIC HISTORICAL  12/28/2015   IR [place] VASC ACCESS RIGHT 12/28/2015 AMarybelle Killings MD [facility]   LEFT A ND RIGHT HEART CATH  01/30/2013   DR BSung Amabile  LEFT AND RIGHT HEART CATHETERIZATION WITH CORONARY ANGIOGRAM N/A 01/30/2013   Procedure: LEFT AND RIGHT HEART CATHETERIZATION WITH CORONARY ANGIOGRAM;  Surgeon: DJolaine Artist MD;  [facility];  Service: Cardiovascular;  Laterality: N/A;   PERIPHERAL VASCULAR CATHETERIZATION Right 01/26/2015   Procedure: A/V Fistulagram;  Surgeon: CAngelia Mould MD;  [facility];  Service: Cardiovascular;  Laterality: Right;   reapea urethral  surgery for recurrent obstruction  2011   TOTAL KNEE ARTHROPLASTY Right 2007   VEIN HARVEST Right 03/31/2016   Procedure: RIGHT GREATER SAPHENOUS VEIN HARVEST;  Surgeon: Angelia Mould, MD;  Location: [place];  Service: Vascular;  Laterality: Right;    There were no vitals filed for this visit.   Subjective Assessment - 07/29/20 1145     Subjective He saw prosthetist who made changes that have helped & wound healed.  No other issues. No falls.    Patient is accompained by: Family member   wife, Kyreese Chio   Pertinent History Left TTA, automatic cardioverter/defibrillator, arthritis, CAD, cardiomyopathy, CHF, COPD, DM2, neuropathy, ESRD, CAD w/catherization, HTN, kidney stones, right TKA 2007    Patient Stated Goals Walk in home & community.    Currently in Pain? No/denies    Pain Onset 1 to 4 weeks ago                [facility] PT Assessment - 07/29/20 1146       Assessment   Medical Diagnosis Bilateral Transtibial Amputations     Referring Provider (PT) Bevely Palmer Persons, PA      Transfers   Sit to Stand 6: Modified independent (Device/Increase time);With upper extremity assist;From chair/3-in-1    Sit to Stand Details --    Stand to Sit 6: Modified independent (Device/Increase time);With upper extremity assist;To chair/3-in-1   from RW   Stand to Sit Details (indicate cue type and reason) --      Ambulation/Gait   Ambulation/Gait Assistance 6: Modified independent (Device/Increase time);4: Min assist    Ramp 6: Modified independent (Device)   indoor ramp w/ cane   Curb 6: Modified independent (Device/increase time)   w/cane & bil. TTA prostheses     High Level Balance   High Level Balance Activities --    High Level Balance Comments --      Standardized Balance Assessment   Standardized Balance Assessment Berg Balance Test;Dynamic Gait Index      Berg Balance Test   Sit to Stand Able to stand  independently using hands    Standing Unsupported Able to stand safely 2 minutes    Sitting with Back Unsupported but Feet Supported on Floor or Stool Able to sit safely and securely 2 minutes    Stand to Sit Controls descent by using hands    Transfers Able to transfer safely, minor use of hands    Standing Unsupported with Eyes Closed Able to stand 10 seconds with supervision    Standing Unsupported with Feet Together Able to place feet together independently and stand for 1 minute with supervision    From Standing, Reach Forward with Outstretched Arm Can reach forward >12 cm safely (5")    From Standing Position, Pick up Object from [place] to pick up shoe safely and easily    From Standing Position, Turn to Look Behind Over each Shoulder Turn sideways only but maintains balance    Turn 360 Degrees Able to turn 360 degrees safely but slowly    Standing Unsupported, Alternately Place Feet on Step/Stool Able to complete >2 steps/needs minimal assist    Standing Unsupported, One Foot in Front Able to take small step  independently and hold 30 seconds    Standing on One Leg Unable to try or needs assist to prevent fall    Total Score 38    Berg comment: BERG  < 36 high risk for falls (close to 100%) 46-51 moderate (>50%)  37-45 significant (>80%) 52-55 lower (> 25%)      Dynamic Gait Index   Level Surface Mild Impairment    Change in Gait Speed Mild Impairment    Gait with Horizontal Head Turns Mild Impairment    Gait with Vertical Head Turns Mild Impairment    Gait and Pivot Turn Mild Impairment    Step Over Obstacle Mild Impairment    Step Around Obstacles Mild Impairment    Steps Mild Impairment    Total Score 16    DGI comment: Dynamic Gait Index  Scores of 19 or less are predictive of falls in older community living adults             Prosthetics Assessment - 07/29/20 [place] with Skin check;Residual limb care;Prosthetic cleaning;Ply sock cleaning;Correct ply sock adjustment;Proper wear schedule/adjustment;Proper weight-bearing schedule/adjustment    Prosthetic Care Comments  PT recommended socket revision or new prosthesis for LLE, follow-up with CPO & wiping limb when doffing to decrease itching.    Donning prosthesis  Modified independent (Device/Increase time)    Doffing prosthesis  Modified independent (Device/Increase time)    Current prosthetic weight-bearing tolerance (hours/day)  Patient tolerated 10 min of standing without c/o limb pain    Residual limb condition  no open areas,    Prosthesis Description bil. silicon liner with shuttle pin lock suspension, dynamice response feet.   Right prosthesis is original from 2018 which is ill fitting. pt choose to not get new socket for RLE until he completed prosthetic training.                          [facility] Adult PT Treatment/Exercise - 07/29/20 1146       Ambulation/Gait   Ambulation/Gait Yes    Ambulation Distance (Feet) 300 Feet   300' cane & 75' x 2  no device    Assistive device Prostheses;Straight cane;None   cane stand alone tip     Self-Care   Self-Care Other Self-Care Comments    Other Self-Care Comments  PT demo technique for scooting chair under & back from table. pt return demo understanding.      Exercises   Exercises Knee/Hip      Knee/Hip Exercises: Stretches   Active Hamstring Stretch Right;Left;2 reps;30 seconds    Active Hamstring Stretch Limitations supine with strap DF      Knee/Hip Exercises: Machines for Strengthening   Total Gym Leg Press shuttle leg press 125# 15 reps 1st set back 45* & 2nd set back flat single leg stance on ea LE by lifting other LE off plate for ~1PHX.      Knee/Hip Exercises: Standing   Forward Step Up --    Forward Step Up Limitations --    Step Down --    Step Down Limitations --    Rocker Board --    Rocker Board Limitations --      Knee/Hip Exercises: Seated   Sit to [employer] --      Prosthetics   Current prosthetic wear tolerance (days/week)  daily    Current prosthetic wear tolerance (#hours/day)  most of awake hours    Edema pitting flucutating with dialysis    Education Provided --                      PT Short Term Goals - 06/24/20 1427  PT SHORT TERM GOAL #1   Title Patient verbalizes proper sweat management with prostheses.    Time 1    Period Months    Status Achieved    Target Date 06/26/20      PT SHORT TERM GOAL #2   Title Patient tolerates bilateral prostheses >90% awake hrs total /day without skin issues    Time 1    Period Months    Status Achieved    Target Date 06/26/20      PT SHORT TERM GOAL #3   Title Patient able to reach 10" without UE support safely    Baseline met 6/3, 11 inch reach    Time 1    Period Months    Status Achieved    Target Date 06/26/20      PT SHORT TERM GOAL #4   Title Patient ambulates 100' with cane & prostheses including sit / stand chair with armrests with supervision    Baseline 150 feet with supervision on 6/3     Time 1    Period Months    Status Achieved    Target Date 06/26/20      PT SHORT TERM GOAL #5   Title Patient negotiates ramps & curbs with cane & prostheses with minA.    Baseline min A for ramp, min A to descend curb but sill needs mod A to ascend curb    Time 1    Period Months    Status Partially Met    Target Date 06/26/20               PT Long Term Goals - 07/29/20 1214       PT LONG TERM GOAL #1   Title Patient demonstrates & verbalized understanding of prosthetic care to enable safe utilization of prostheses.    Time 12    Period Weeks    Status Achieved      PT LONG TERM GOAL #2   Title Patient tolerates prostheses wear for >90% of awake hours without skin issues    Time 12    Period Weeks    Status Achieved      PT LONG TERM GOAL #3   Title Berg Balance >/= 36/56 to indicate lower fall risk    Baseline 7/11/022 Merrilee Jansky 38/56    Time 12    Period Weeks    Status Achieved      PT LONG TERM GOAL #4   Title Patient ambulates 300' with LRAD & prostheses modified independent    Time 12    Period Weeks    Status Achieved      PT LONG TERM GOAL #5   Title Patient negotiates ramps, curbs & stairs with LRAD & prostheses modified independent.    Time 12    Period Weeks    Status Achieved                   Plan - 07/29/20 1149     Clinical Impression Statement Patient met all LTGs.  He appears to be safely functioning with bilateral transtibial prostheses at community level with cane and household level without device intermittently.  Berg Balance score of 38/56 indicates improved balance but still places patient at risk of falls which is being compensated using cane.  Dynamic Gait Index 16/24 using cane indicates fall risk with score below 19.    Personal Factors and Comorbidities Comorbidity 3+;Fitness;Time since onset of injury/illness/exacerbation    Comorbidities Left TTA, automatic cardioverter/defibrillator,  arthritis, CAD, cardiomyopathy,  CHF, COPD, DM2, neuropathy, ESRD, CAD w/catherization, HTN, kidney stones, right TKA 2007    Examination-Activity Limitations Lift;Locomotion Level;Squat;Stairs;Stand;Transfers    Examination-Participation Restrictions Driving;Community Activity    Stability/Clinical Decision Making Evolving/Moderate complexity    Rehab Potential Good    PT Frequency 2x / week    PT Duration 12 weeks    PT Treatment/Interventions ADLs/Self Care Home Management;DME Instruction;Gait training;Stair training;Functional mobility training;Therapeutic activities;Therapeutic exercise;Balance training;Neuromuscular re-education;Patient/family education;Prosthetic Training;Vestibular    PT Next Visit Plan discharge    Consulted and Agree with Plan of Care Patient             Patient will benefit from skilled therapeutic intervention in order to improve the following deficits and impairments:  Abnormal gait, Decreased activity tolerance, Decreased balance, Decreased endurance, Decreased knowledge of use of DME, Decreased mobility, Decreased skin integrity, Decreased strength, Increased edema, Impaired flexibility, Postural dysfunction, Prosthetic Dependency, Pain  Visit Diagnosis: Other abnormalities of gait and mobility  Muscle weakness (generalized)  Unsteadiness on feet     Problem List Patient Active Problem List   Diagnosis Date Noted   Malnutrition of moderate degree 11/18/2019   Below-knee amputation of left lower extremity ([place]) 11/16/2019   Abscess of left foot 11/13/2019   Dehiscence of amputation stump ([place])    History of complete ray amputation of fifth toe of left foot ([place]) 10/09/2019   Acute pulmonary edema ([facility])    Osteomyelitis of fifth toe of left foot ([place])    Febrile illness 08/17/2019   SIRS (systemic inflammatory response syndrome) ([place]) 08/17/2019   Thrombocytopenia ([place]) 08/17/2019   Pressure injury of back, stage 2 ([place]) 08/17/2019   Hypertensive emergency 07/10/2019    Onychomycosis 08/16/2016   Osteopathy in diseases classified elsewhere, unspecified site 05/14/2016   S/P bilateral below knee amputation ([place]) 04/26/2016   Bilateral carotid bruits 03/30/2016   Diabetes mellitus with complication ([place])    Anemia due to chronic kidney disease    ESRD on dialysis ([place]) 11/30/2015   Acute respiratory failure with hypoxia ([place]) 11/29/2015   Dependence on renal dialysis ([place]) 08/15/2015   Presence of automatic (implantable) cardiac defibrillator 03/12/2015   At high risk for falls 09/29/2014   Peripheral vascular disease ([place]) 09/29/2014   Osteoarthritis of both knees 01/07/2014   Osteoarthritis of multiple joints 10/06/2013   NICM (nonischemic cardiomyopathy) ([place]) 06/27/2013   CAD (coronary artery disease) 03/13/2013   Abnormal nuclear stress test 25/00/3704   Chronic systolic congestive heart failure ([place]) 01/27/2013   COPD (chronic obstructive pulmonary disease) ([place]) 12/27/2012   Diabetic peripheral neuropathy ([place]) 02/10/2012   Urethral stricture 10/06/2010   URINARY CALCULUS 09/28/2009   Adjustment disorder with depressed mood 08/21/2009   Poorly controlled type II diabetes mellitus with renal complication ([place]) 88/89/1694   Hyperlipidemia 04/14/2008   Essential hypertension 04/14/2008   Allergic rhinitis 04/14/2008    Jamey Reas, PT, DPT 07/29/2020, 12:19 PM  [facility] [address] [place], [place], [address] Phone: [phone]   Fax:  [phone]  Name: Anthony Bullock MRN: [id number] Date of Birth: 07-04-1960

## 2020-07-30 DIAGNOSIS — E209 Hypoparathyroidism, unspecified: Secondary | ICD-10-CM | POA: Diagnosis not present

## 2020-07-30 DIAGNOSIS — D631 Anemia in chronic kidney disease: Secondary | ICD-10-CM | POA: Diagnosis not present

## 2020-07-30 DIAGNOSIS — N186 End stage renal disease: Secondary | ICD-10-CM | POA: Diagnosis not present

## 2020-07-30 DIAGNOSIS — N250 Renal osteodystrophy: Secondary | ICD-10-CM | POA: Diagnosis not present

## 2020-07-30 DIAGNOSIS — Z992 Dependence on renal dialysis: Secondary | ICD-10-CM | POA: Diagnosis not present

## 2020-07-30 DIAGNOSIS — D509 Iron deficiency anemia, unspecified: Secondary | ICD-10-CM | POA: Diagnosis not present

## 2020-08-01 DIAGNOSIS — D631 Anemia in chronic kidney disease: Secondary | ICD-10-CM | POA: Diagnosis not present

## 2020-08-01 DIAGNOSIS — Z992 Dependence on renal dialysis: Secondary | ICD-10-CM | POA: Diagnosis not present

## 2020-08-01 DIAGNOSIS — N186 End stage renal disease: Secondary | ICD-10-CM | POA: Diagnosis not present

## 2020-08-01 DIAGNOSIS — D509 Iron deficiency anemia, unspecified: Secondary | ICD-10-CM | POA: Diagnosis not present

## 2020-08-01 DIAGNOSIS — N250 Renal osteodystrophy: Secondary | ICD-10-CM | POA: Diagnosis not present

## 2020-08-01 DIAGNOSIS — E209 Hypoparathyroidism, unspecified: Secondary | ICD-10-CM | POA: Diagnosis not present

## 2020-08-04 DIAGNOSIS — E209 Hypoparathyroidism, unspecified: Secondary | ICD-10-CM | POA: Diagnosis not present

## 2020-08-04 DIAGNOSIS — Z992 Dependence on renal dialysis: Secondary | ICD-10-CM | POA: Diagnosis not present

## 2020-08-04 DIAGNOSIS — D509 Iron deficiency anemia, unspecified: Secondary | ICD-10-CM | POA: Diagnosis not present

## 2020-08-04 DIAGNOSIS — N186 End stage renal disease: Secondary | ICD-10-CM | POA: Diagnosis not present

## 2020-08-04 DIAGNOSIS — N250 Renal osteodystrophy: Secondary | ICD-10-CM | POA: Diagnosis not present

## 2020-08-04 DIAGNOSIS — D631 Anemia in chronic kidney disease: Secondary | ICD-10-CM | POA: Diagnosis not present

## 2020-08-06 DIAGNOSIS — D509 Iron deficiency anemia, unspecified: Secondary | ICD-10-CM | POA: Diagnosis not present

## 2020-08-06 DIAGNOSIS — E209 Hypoparathyroidism, unspecified: Secondary | ICD-10-CM | POA: Diagnosis not present

## 2020-08-06 DIAGNOSIS — Z992 Dependence on renal dialysis: Secondary | ICD-10-CM | POA: Diagnosis not present

## 2020-08-06 DIAGNOSIS — N186 End stage renal disease: Secondary | ICD-10-CM | POA: Diagnosis not present

## 2020-08-06 DIAGNOSIS — N250 Renal osteodystrophy: Secondary | ICD-10-CM | POA: Diagnosis not present

## 2020-08-06 DIAGNOSIS — D631 Anemia in chronic kidney disease: Secondary | ICD-10-CM | POA: Diagnosis not present

## 2020-08-08 DIAGNOSIS — N186 End stage renal disease: Secondary | ICD-10-CM | POA: Diagnosis not present

## 2020-08-08 DIAGNOSIS — N250 Renal osteodystrophy: Secondary | ICD-10-CM | POA: Diagnosis not present

## 2020-08-08 DIAGNOSIS — Z992 Dependence on renal dialysis: Secondary | ICD-10-CM | POA: Diagnosis not present

## 2020-08-08 DIAGNOSIS — D631 Anemia in chronic kidney disease: Secondary | ICD-10-CM | POA: Diagnosis not present

## 2020-08-08 DIAGNOSIS — U071 COVID-19: Secondary | ICD-10-CM | POA: Diagnosis not present

## 2020-08-08 DIAGNOSIS — D509 Iron deficiency anemia, unspecified: Secondary | ICD-10-CM | POA: Diagnosis not present

## 2020-08-08 DIAGNOSIS — E209 Hypoparathyroidism, unspecified: Secondary | ICD-10-CM | POA: Diagnosis not present

## 2020-08-09 DIAGNOSIS — Z992 Dependence on renal dialysis: Secondary | ICD-10-CM | POA: Diagnosis not present

## 2020-08-09 DIAGNOSIS — N186 End stage renal disease: Secondary | ICD-10-CM | POA: Diagnosis not present

## 2020-08-11 DIAGNOSIS — D509 Iron deficiency anemia, unspecified: Secondary | ICD-10-CM | POA: Diagnosis not present

## 2020-08-11 DIAGNOSIS — N186 End stage renal disease: Secondary | ICD-10-CM | POA: Diagnosis not present

## 2020-08-11 DIAGNOSIS — N2581 Secondary hyperparathyroidism of renal origin: Secondary | ICD-10-CM | POA: Diagnosis not present

## 2020-08-11 DIAGNOSIS — D631 Anemia in chronic kidney disease: Secondary | ICD-10-CM | POA: Diagnosis not present

## 2020-08-11 DIAGNOSIS — Z992 Dependence on renal dialysis: Secondary | ICD-10-CM | POA: Diagnosis not present

## 2020-08-13 DIAGNOSIS — Z992 Dependence on renal dialysis: Secondary | ICD-10-CM | POA: Diagnosis not present

## 2020-08-13 DIAGNOSIS — N186 End stage renal disease: Secondary | ICD-10-CM | POA: Diagnosis not present

## 2020-08-13 DIAGNOSIS — D509 Iron deficiency anemia, unspecified: Secondary | ICD-10-CM | POA: Diagnosis not present

## 2020-08-13 DIAGNOSIS — N2581 Secondary hyperparathyroidism of renal origin: Secondary | ICD-10-CM | POA: Diagnosis not present

## 2020-08-13 DIAGNOSIS — D631 Anemia in chronic kidney disease: Secondary | ICD-10-CM | POA: Diagnosis not present

## 2020-08-14 ENCOUNTER — Ambulatory Visit: Payer: Medicare Other | Admitting: Cardiology

## 2020-08-15 DIAGNOSIS — Z992 Dependence on renal dialysis: Secondary | ICD-10-CM | POA: Diagnosis not present

## 2020-08-15 DIAGNOSIS — N186 End stage renal disease: Secondary | ICD-10-CM | POA: Diagnosis not present

## 2020-08-15 DIAGNOSIS — D509 Iron deficiency anemia, unspecified: Secondary | ICD-10-CM | POA: Diagnosis not present

## 2020-08-15 DIAGNOSIS — D631 Anemia in chronic kidney disease: Secondary | ICD-10-CM | POA: Diagnosis not present

## 2020-08-15 DIAGNOSIS — N2581 Secondary hyperparathyroidism of renal origin: Secondary | ICD-10-CM | POA: Diagnosis not present

## 2020-08-18 DIAGNOSIS — N2581 Secondary hyperparathyroidism of renal origin: Secondary | ICD-10-CM | POA: Diagnosis not present

## 2020-08-18 DIAGNOSIS — Z992 Dependence on renal dialysis: Secondary | ICD-10-CM | POA: Diagnosis not present

## 2020-08-18 DIAGNOSIS — D631 Anemia in chronic kidney disease: Secondary | ICD-10-CM | POA: Diagnosis not present

## 2020-08-18 DIAGNOSIS — D509 Iron deficiency anemia, unspecified: Secondary | ICD-10-CM | POA: Diagnosis not present

## 2020-08-18 DIAGNOSIS — N186 End stage renal disease: Secondary | ICD-10-CM | POA: Diagnosis not present

## 2020-08-19 ENCOUNTER — Telehealth: Payer: Self-pay | Admitting: Family Medicine

## 2020-08-19 NOTE — Telephone Encounter (Signed)
[facility] called to advise they need the most recent clinical notes from the most recent office visit with Dr.Burchette faxed to them at [phone].

## 2020-08-20 DIAGNOSIS — N2581 Secondary hyperparathyroidism of renal origin: Secondary | ICD-10-CM | POA: Diagnosis not present

## 2020-08-20 DIAGNOSIS — D509 Iron deficiency anemia, unspecified: Secondary | ICD-10-CM | POA: Diagnosis not present

## 2020-08-20 DIAGNOSIS — Z992 Dependence on renal dialysis: Secondary | ICD-10-CM | POA: Diagnosis not present

## 2020-08-20 DIAGNOSIS — N186 End stage renal disease: Secondary | ICD-10-CM | POA: Diagnosis not present

## 2020-08-20 DIAGNOSIS — D631 Anemia in chronic kidney disease: Secondary | ICD-10-CM | POA: Diagnosis not present

## 2020-08-21 NOTE — Telephone Encounter (Signed)
Office notes have been printed and faxed to the requested number.

## 2020-08-22 DIAGNOSIS — Z992 Dependence on renal dialysis: Secondary | ICD-10-CM | POA: Diagnosis not present

## 2020-08-22 DIAGNOSIS — D509 Iron deficiency anemia, unspecified: Secondary | ICD-10-CM | POA: Diagnosis not present

## 2020-08-22 DIAGNOSIS — D631 Anemia in chronic kidney disease: Secondary | ICD-10-CM | POA: Diagnosis not present

## 2020-08-22 DIAGNOSIS — N2581 Secondary hyperparathyroidism of renal origin: Secondary | ICD-10-CM | POA: Diagnosis not present

## 2020-08-22 DIAGNOSIS — N186 End stage renal disease: Secondary | ICD-10-CM | POA: Diagnosis not present

## 2020-08-24 ENCOUNTER — Encounter: Payer: Self-pay | Admitting: Cardiology

## 2020-08-24 ENCOUNTER — Ambulatory Visit (INDEPENDENT_AMBULATORY_CARE_PROVIDER_SITE_OTHER): Payer: Medicare Other | Admitting: Cardiology

## 2020-08-24 VITALS — BP 140/62 | HR 79 | Ht 70.0 in | Wt 196.0 lb

## 2020-08-24 DIAGNOSIS — I2510 Atherosclerotic heart disease of native coronary artery without angina pectoris: Secondary | ICD-10-CM

## 2020-08-24 DIAGNOSIS — I10 Essential (primary) hypertension: Secondary | ICD-10-CM

## 2020-08-24 DIAGNOSIS — E782 Mixed hyperlipidemia: Secondary | ICD-10-CM | POA: Diagnosis not present

## 2020-08-24 DIAGNOSIS — I5022 Chronic systolic (congestive) heart failure: Secondary | ICD-10-CM

## 2020-08-24 DIAGNOSIS — I6523 Occlusion and stenosis of bilateral carotid arteries: Secondary | ICD-10-CM | POA: Diagnosis not present

## 2020-08-24 NOTE — Patient Instructions (Signed)
Medication Instructions:  Continue all current medications.   Labwork: none  Testing/Procedures: none  Follow-Up: 6 months   Any Other Special Instructions Will Be Listed Below (If Applicable).   If you need a refill on your cardiac medications before your next appointment, please call your pharmacy.  

## 2020-08-24 NOTE — Progress Notes (Signed)
Clinical Summary Anthony Bullock is a 60 y.o.maleseen today for follow up of the following medical problems.      1. Chronic systolic HF/NICM - echo in 2015 LVEF 25%. echo 08/2015 LVEF has improved to 50-55%. - has had some of his CHF meds decreased due to low bp's on HD.  - has ICD, since LVEF recovered did not have gen change and now inactive.     08/2016 echo LVEF 55%. , grade I diasotlic function.      08/2019 echo LVEF 50%, grade Idd  - no recent SOB/DOE. No adbominal distension from fluid (has bilateral leg amputations) - compliant  with meds   2. ESRD -low bp's has been a progressing issue on HD  - takes midodrine 27m before HD. Some low bp's during last hour.       3. HTN - can have occasional low bp's during HD - compliant with meds   - does not take coreg or norvasc prior to HD or night before.   - last visit stopped norvasc due to low bp's during HD   4. CAD - cath 2015 with diffuse nonobstructive disease, worst lesion 80% small distal LAD.    - no recent symptoms   5. Hyperlipidemia - Jan 2019 TC 103 TG 136 HDL 38 LDL 38 04/2019 TG 187 HDL 37 LDL 84  06/2020 TC 101 HDL 37 TG 140 LDL 42       6. DM2 - 06/2019 HgbA1c 8.1 - followed by pcp   7. Carotid stenosis - followed by vascular.  - in 2019 mild bilateral disease   Past Medical History:  Diagnosis Date   AICD (automatic cardioverter/defibrillator) present    [employer]   Allergic rhinitis    Anemia    Arthritis    Chronic systolic heart failure ([facility]    a. ECHO (12/2012) EF 25-30%, HK entireanteroseptal myocardium //  b.  EF 25%, diffuse HK, grade 1 diastolic dysfunction, MAC, mild LAE, normal RVSF, trivial pericardial effusion   COPD (chronic obstructive pulmonary disease) ([facility]    Diabetes mellitus type II    Diabetic nephropathy ([place]    Diabetic neuropathy ([place]    ESRD on hemodialysis ([place]    [facility] 2017[facility] BHinda Lenis  History of  cardiac catheterization    a.Myoview 1/15:  There is significant left ventricular dysfunction. There may be slight scar at the apex. There is no significant ischemia. LV Ejection Fraction: 27%  //  b. RHC/LHC (1/15) with mean RA 6, PA 47/22 mean 33, mean PCWP 20, PVR 2.5 WU, CI 2.5; 80% dLAD stenosis, 70% diffuse large D.     History of kidney stones    Hyperlipidemia    Hypertension    Kidney stones    NICM (nonischemic cardiomyopathy) ([place]    Primarily nonischemic.  Echo (12/14) with EF 25-30%.  Echo (3/15) with EF 25%, mild to moderately dilated LV, normal RV size and systolic function.     Osteomyelitis ([facility]    left fifth ray   Pneumonia    Urethral stricture    Wears glasses      Allergies  Allergen Reactions   Epoetin Alfa Other (See Comments)    unknown   Ferumoxytol Other (See Comments)    unknown   Morphine Sulfate Rash and Other (See Comments)    Itches all over, red spots     Current Outpatient Medications  Medication Sig Dispense  Refill   acetaminophen (TYLENOL) 325 MG tablet Take 1-2 tablets (325-650 mg total) by mouth every 4 (four) hours as needed for mild pain.     albuterol (VENTOLIN HFA) 108 (90 Base) MCG/ACT inhaler Inhale 2 puffs into the lungs every 4 (four) hours as needed for wheezing or shortness of breath. 1 each 1   aspirin EC 81 MG tablet Take 81 mg by mouth daily.      atorvastatin (LIPITOR) 80 MG tablet Take 1 tablet (80 mg total) by mouth at bedtime. 90 tablet 3   azelastine (ASTELIN) 0.1 % nasal spray Place 2 sprays into both nostrils 3 (three) times daily as needed for rhinitis. Use in each nostril as directed 30 mL 12   carvedilol (COREG) 25 MG tablet Take 1 tablet (25 mg total) by mouth 2 (two) times daily. 180 tablet 3   cinacalcet (SENSIPAR) 30 MG tablet Take 1 tablet (30 mg total) by mouth Every Tuesday,Thursday,and Saturday with dialysis. 60 tablet    Continuous Blood Gluc Sensor (FREESTYLE LIBRE 14 DAY SENSOR) MISC USE AS DIRECTED EVERY 14  DAYS 4 each 11   doxercalciferol (HECTOROL) 4 MCG/2ML injection Inject 1.25 mLs (2.5 mcg total) into the vein Every Tuesday,Thursday,and Saturday with dialysis. 2 mL    fenofibrate 160 MG tablet Take 1 tablet (160 mg total) by mouth daily. 90 tablet 3   fexofenadine (ALLEGRA) 180 MG tablet Take 180 mg by mouth daily.     furosemide (LASIX) 40 MG tablet Take 3 tablets (120 mg total) by mouth daily. 270 tablet 0   gabapentin (NEURONTIN) 100 MG capsule Take one pill in morning and two pills at bedtime. 90 capsule 0   glucose blood test strip Check 1 time daily. E11.9 One Touch Ultra Blue Test Strips 100 each 3   HUMALOG KWIKPEN 200 UNIT/ML KwikPen USE TWICE DAILY WITH LUNCH AND SUPPER PER SLIDING SCALE (MAXIMUM DAILY DOSE 28 UNITS) 6 mL 0   insulin glargine (LANTUS SOLOSTAR) 100 UNIT/ML Solostar Pen Inject 12 units subcutaneously at bed time. 15 mL 2   Insulin Pen Needle (BD PEN NEEDLE NANO U/F) 32G X 4 MM MISC USE 1 PEN NEEDLE SUBCUTANEOUSLY WITH INSULIN 4 TIMES DAILY 400 each 0   lanthanum (FOSRENOL) 500 MG chewable tablet Chew 500-1,000 mg by mouth See admin instructions. Take 2000 mg with meals three time a day and 500 mg with snacks     multivitamin (RENA-VIT) TABS tablet Take 1 tablet by mouth daily. 90 tablet 3   Olopatadine HCl 0.2 % SOLN Place 1 drop into both eyes daily as needed (for allergies).      ondansetron (ZOFRAN ODT) 8 MG disintegrating tablet Take 1 tablet (8 mg total) by mouth every 8 (eight) hours as needed for nausea or vomiting. 20 tablet 1   No current facility-administered medications for this visit.     Past Surgical History:  Procedure Laterality Date   ABDOMINAL AORTOGRAM W/LOWER EXTREMITY N/A 03/30/2016   Procedure: Abdominal Aortogram w/Lower Extremity;  Surgeon: Angelia Mould, MD;  Location: [facility];  Service: Cardiovascular;  Laterality: N/A;   AMPUTATION Right 04/26/2016   Procedure: Right Below Knee Amputation;  Surgeon: Newt Minion, MD;   Location: [facility];  Service: Orthopedics;  Laterality: Right;   AMPUTATION Left 08/21/2019   Procedure: LEFT FOOT 5TH RAY AMPUTATION;  Surgeon: Newt Minion, MD;  Location: [facility];  Service: Orthopedics;  Laterality: Left;   AMPUTATION Left 11/13/2019   Procedure: LEFT BELOW  KNEE AMPUTATION;  Surgeon: Newt Minion, MD;  Location: [facility];  Service: Orthopedics;  Laterality: Left;   AV FISTULA PLACEMENT Right 09/08/2015   Procedure: INSERTION OF 4-21m x 45cm  ARTERIOVENOUS (AV) GORE-TEX GRAFT RIGHT UPPER  ARM;  Surgeon: CAngelia Mould MD;  [place]  Service: Vascular;  Laterality: Right;   AV FISTULA PLACEMENT Left 01/14/2016   Procedure: CREATION OF LEFT UPPER ARM ARTERIOVENOUS FISTULA;  Surgeon: CAngelia Mould MD;  [place]  Service: Vascular;  Laterality: Left;   B[place]Right 08/22/2014   Procedure: [place]ER [place]  Surgeon: CAngelia Mould MD;  [place] [facility]  Service: Vascular;  Laterality: Right;   BELOW KNEE LEG AMPUTATION Right 04/26/2016   CARDIAC CATHETERIZATION     CARDIAC DEFIBRILLATOR PLACEMENT  06/27/2013   Sub Q       BY DR KCaryl Comes  CATARACT EXTRACTION W/PHACO Right 08/06/2018   Procedure: CATARACT EXTRACTION PHACO AND INTRAOCULAR LENS PLACEMENT ([place];  Surgeon: WBaruch Goldmann MD;  [facility];  Service: Ophthalmology;  Laterality: Right;  CDE: 4.06   CATARACT EXTRACTION W/PHACO Left 08/20/2018   Procedure: CATARACT EXTRACTION PHACO AND INTRAOCULAR LENS PLACEMENT (I[facility]);  Surgeon: WBaruch Goldmann MD;  [facility];  Service: Ophthalmology;  Laterality: Left;  CDE: 6.76   COLONOSCOPY WITH PROPOFOL N/A 07/22/2015   Procedure: COLONOSCOPY WITH PROPOFOL;  Surgeon: HDoran Stabler MD;  [facility];  Service: Gastroenterology;  Laterality: N/A;   FEMORAL-POPLITEAL BYPASS GRAFT Right 03/31/2016   Procedure: BYPASS GRAFT FEMORAL-POPLITEAL ARTERY USING RIGHT GREATER SAPHENOUS NONREVERSED  VEIN;  Surgeon: CAngelia Mould MD;  [place]  Service: Vascular;  Laterality: Right;   HERNIA REPAIR     I & D EXTREMITY Right 03/31/2016   Procedure: IRRIGATION AND DEBRIDEMENT FOOT;  Surgeon: CAngelia Mould MD;  [place]  Service: Vascular;  Laterality: Right;   IMPLANTABLE CARDIOVERTER DEFIBRILLATOR IMPLANT N/A 06/27/2013   Procedure: SUB Q ICD;  Surgeon: SDeboraha Sprang MD;  [facility];  Service: Cardiovascular;  Laterality: N/A;   INTRAOPERATIVE ARTERIOGRAM Right 03/31/2016   Procedure: INTRA OPERATIVE ARTERIOGRAM;  Surgeon: CAngelia Mould MD;  [place] [facility]  Service: Vascular;  Laterality: Right;   IR GENERIC HISTORICAL Right 11/30/2015   IR THROMBECTOMY AV FISTULA W/THROMBOLYSIS/PTA INC/SHUNT/IMG RIGHT 11/30/2015 GAletta Edouard MD [facility]   IR GENERIC HISTORICAL  11/30/2015   IR [place] ACCESS RIGHT 11/30/2015 GAletta Edouard MD [facility]   IR GENERIC HISTORICAL Right 12/15/2015   IR THROMBECTOMY AV FISTULA W/THROMBOLYSIS/PTA/STENT INC/SHUNT/IMG RT 12/15/2015 DArne Cleveland MD [facility]   IR GENERIC HISTORICAL  12/15/2015   IR [place] VASC ACCESS RIGHT 12/15/2015 DArne Cleveland MD [facility]   IR GENERIC HISTORICAL  12/28/2015   IR FLUORO GUIDE CV LINE RIGHT 12/28/2015 AMarybelle Killings MD [facility]   IR GENERIC HISTORICAL  12/28/2015   IR [place] VASC ACCESS RIGHT 12/28/2015 AMarybelle Killings MD [facility]   LEFT A ND RIGHT HEART CATH  01/30/2013   DR BSung Amabile  LEFT AND RIGHT HEART CATHETERIZATION WITH CORONARY ANGIOGRAM N/A 01/30/2013   Procedure: LEFT AND RIGHT HEART CATHETERIZATION WITH CORONARY ANGIOGRAM;  Surgeon: DJolaine Artist MD;  [facility];  Service: Cardiovascular;  Laterality: N/A;   PERIPHERAL VASCULAR CATHETERIZATION Right 01/26/2015   Procedure: A/V Fistulagram;  Surgeon: CAngelia Mould MD;  [facility];  Service:  Cardiovascular;  Laterality: Right;   reapea  urethral surgery for recurrent obstruction  2011   TOTAL KNEE ARTHROPLASTY Right 2007   VEIN HARVEST Right 03/31/2016   Procedure: RIGHT GREATER SAPHENOUS VEIN HARVEST;  Surgeon: Angelia Mould, MD;  Location: [facility];  Service: Vascular;  Laterality: Right;     Allergies  Allergen Reactions   Epoetin Alfa Other (See Comments)    unknown   Ferumoxytol Other (See Comments)    unknown   Morphine Sulfate Rash and Other (See Comments)    Itches all over, red spots      Family History  Problem Relation Age of Onset   Bladder Cancer Mother    Alcohol abuse Father    Melanoma Father    Stroke Maternal Grandmother    Heart Problems Maternal Grandmother        unknown   Diabetes Maternal Grandmother    Heart disease Maternal Grandfather    Prostate cancer Maternal Grandfather      Social History Anthony Bullock reports that he quit smoking about 10 years ago. His smoking use included cigarettes. He has a 64.00 pack-year smoking history. He has never used smokeless tobacco. Anthony Bullock reports no history of alcohol use.   Review of Systems CONSTITUTIONAL: No weight loss, fever, chills, weakness or fatigue.  HEENT: Eyes: No visual loss, blurred vision, double vision or yellow sclerae.No hearing loss, sneezing, congestion, runny nose or sore throat.  SKIN: No rash or itching.  CARDIOVASCULAR: per hpi RESPIRATORY: No shortness of breath, cough or sputum.  GASTROINTESTINAL: No anorexia, nausea, vomiting or diarrhea. No abdominal pain or blood.  GENITOURINARY: No burning on urination, no polyuria NEUROLOGICAL: No headache, dizziness, syncope, paralysis, ataxia, numbness or tingling in the extremities. No change in bowel or bladder control.  MUSCULOSKELETAL: No muscle, back pain, joint pain or stiffness.  LYMPHATICS: No enlarged nodes. No history of splenectomy.  PSYCHIATRIC: No history of depression or anxiety.  ENDOCRINOLOGIC: No reports of sweating, cold or heat intolerance. No  polyuria or polydipsia.  [name]   Physical Examination Today's Vitals   08/24/20 1342  BP: 140/62  Pulse: 79  Weight: 196 lb (88.9 kg)  Height: _0  (1.778 m)   Body mass index is 28.12 kg/m.  Gen: resting comfortably, no acute distress HEENT: no scleral icterus, pupils equal round and reactive, no palptable cervical adenopathy,  CV: RRR, no mr/g no jvd Resp: Clear to auscultation bilaterally GI: abdomen is soft, non-tender, non-distended, normal bowel sounds, no hepatosplenomegaly MSK: bilateral LE prosthetics Skin: warm, no rash Neuro:  no focal deficits Psych: appropriate affect   Diagnostic Studies  08/2015 echo Study Conclusions   - Left ventricle: The cavity size was normal. Systolic function was   normal. The estimated ejection fraction was in the range of 50%   to 55%. Wall motion was normal; there were no regional wall   motion abnormalities. Doppler parameters are consistent with   abnormal left ventricular relaxation (grade 1 diastolic   dysfunction). - Left atrium: The atrium was mildly dilated. - Atrial septum: No defect or patent foramen ovale was identified.   Jan 2015 cath Findings:   RA = 6 RV = 48/5/7 PA =  47/22 (33) PCW = 20 Fick cardiac output/index = 5.2/2.5 PVR = 2.5 WU SVR = 1192 FA sat = 98% PA sat = 65%, 68%   Ao Pressure: 116/64 (84) LV Pressure: 122/13/18 There was no signficant gradient across the aortic valve on pullback.   Left main:  Mild ostial tapering otherwise ok   LAD: Narrow vessel (due to diffuse diabetic vasculopathy). 30% ostial. 40-50% tubular lesion  in the mid section. 80% lesion distally. Large diagonal with moderate diffuse disease and 70% lesion in midsection   LCX: Non dominant vessel with diffuse diabetic vasculopathy. Large OM-1. Small OM-2 and OM-3. 2 PLs. 30% mid AV groove CX lesion. 40% lesion in proximal OM-1   RCA: Large dominant vessel with just mild plaque   Assessment: 1. CAD with diffuse diabetic  vasculopathy. Only high grade lesion is in small distal LAD 2. Well-compensated hemodynamics   Assessment and Plan   1. Chronic systolic HF - LVEF has now normalized - medical therapy has been limited due to low bp's during HD, as well as dizziness - denies any symptoms, continue current meds   2. HTN - issues with low bp's during HD - continue coreg at this time, other bp meds have been stopped.    3. CAD - nonobstructive disease by prior cath.  -denies any chset pains, continue to monitor  4. Carotid stenosis - mild disease by 2019 [name], recheck after next visit.    5. Hyperlipidemia - at goal, continue statin      Arnoldo Lenis, M.D.

## 2020-08-25 DIAGNOSIS — N2581 Secondary hyperparathyroidism of renal origin: Secondary | ICD-10-CM | POA: Diagnosis not present

## 2020-08-25 DIAGNOSIS — D631 Anemia in chronic kidney disease: Secondary | ICD-10-CM | POA: Diagnosis not present

## 2020-08-25 DIAGNOSIS — Z992 Dependence on renal dialysis: Secondary | ICD-10-CM | POA: Diagnosis not present

## 2020-08-25 DIAGNOSIS — D509 Iron deficiency anemia, unspecified: Secondary | ICD-10-CM | POA: Diagnosis not present

## 2020-08-25 DIAGNOSIS — N186 End stage renal disease: Secondary | ICD-10-CM | POA: Diagnosis not present

## 2020-08-27 DIAGNOSIS — Z992 Dependence on renal dialysis: Secondary | ICD-10-CM | POA: Diagnosis not present

## 2020-08-27 DIAGNOSIS — D509 Iron deficiency anemia, unspecified: Secondary | ICD-10-CM | POA: Diagnosis not present

## 2020-08-27 DIAGNOSIS — N2581 Secondary hyperparathyroidism of renal origin: Secondary | ICD-10-CM | POA: Diagnosis not present

## 2020-08-27 DIAGNOSIS — D631 Anemia in chronic kidney disease: Secondary | ICD-10-CM | POA: Diagnosis not present

## 2020-08-27 DIAGNOSIS — N186 End stage renal disease: Secondary | ICD-10-CM | POA: Diagnosis not present

## 2020-08-29 DIAGNOSIS — N2581 Secondary hyperparathyroidism of renal origin: Secondary | ICD-10-CM | POA: Diagnosis not present

## 2020-08-29 DIAGNOSIS — N186 End stage renal disease: Secondary | ICD-10-CM | POA: Diagnosis not present

## 2020-08-29 DIAGNOSIS — D509 Iron deficiency anemia, unspecified: Secondary | ICD-10-CM | POA: Diagnosis not present

## 2020-08-29 DIAGNOSIS — Z992 Dependence on renal dialysis: Secondary | ICD-10-CM | POA: Diagnosis not present

## 2020-08-29 DIAGNOSIS — D631 Anemia in chronic kidney disease: Secondary | ICD-10-CM | POA: Diagnosis not present

## 2020-09-01 DIAGNOSIS — Z992 Dependence on renal dialysis: Secondary | ICD-10-CM | POA: Diagnosis not present

## 2020-09-01 DIAGNOSIS — N2581 Secondary hyperparathyroidism of renal origin: Secondary | ICD-10-CM | POA: Diagnosis not present

## 2020-09-01 DIAGNOSIS — N186 End stage renal disease: Secondary | ICD-10-CM | POA: Diagnosis not present

## 2020-09-01 DIAGNOSIS — D631 Anemia in chronic kidney disease: Secondary | ICD-10-CM | POA: Diagnosis not present

## 2020-09-01 DIAGNOSIS — D509 Iron deficiency anemia, unspecified: Secondary | ICD-10-CM | POA: Diagnosis not present

## 2020-09-03 DIAGNOSIS — D631 Anemia in chronic kidney disease: Secondary | ICD-10-CM | POA: Diagnosis not present

## 2020-09-03 DIAGNOSIS — N2581 Secondary hyperparathyroidism of renal origin: Secondary | ICD-10-CM | POA: Diagnosis not present

## 2020-09-03 DIAGNOSIS — N186 End stage renal disease: Secondary | ICD-10-CM | POA: Diagnosis not present

## 2020-09-03 DIAGNOSIS — Z992 Dependence on renal dialysis: Secondary | ICD-10-CM | POA: Diagnosis not present

## 2020-09-03 DIAGNOSIS — D509 Iron deficiency anemia, unspecified: Secondary | ICD-10-CM | POA: Diagnosis not present

## 2020-09-05 DIAGNOSIS — D509 Iron deficiency anemia, unspecified: Secondary | ICD-10-CM | POA: Diagnosis not present

## 2020-09-05 DIAGNOSIS — N2581 Secondary hyperparathyroidism of renal origin: Secondary | ICD-10-CM | POA: Diagnosis not present

## 2020-09-05 DIAGNOSIS — N186 End stage renal disease: Secondary | ICD-10-CM | POA: Diagnosis not present

## 2020-09-05 DIAGNOSIS — D631 Anemia in chronic kidney disease: Secondary | ICD-10-CM | POA: Diagnosis not present

## 2020-09-05 DIAGNOSIS — Z992 Dependence on renal dialysis: Secondary | ICD-10-CM | POA: Diagnosis not present

## 2020-09-08 DIAGNOSIS — D631 Anemia in chronic kidney disease: Secondary | ICD-10-CM | POA: Diagnosis not present

## 2020-09-08 DIAGNOSIS — D509 Iron deficiency anemia, unspecified: Secondary | ICD-10-CM | POA: Diagnosis not present

## 2020-09-08 DIAGNOSIS — N2581 Secondary hyperparathyroidism of renal origin: Secondary | ICD-10-CM | POA: Diagnosis not present

## 2020-09-08 DIAGNOSIS — Z992 Dependence on renal dialysis: Secondary | ICD-10-CM | POA: Diagnosis not present

## 2020-09-08 DIAGNOSIS — N186 End stage renal disease: Secondary | ICD-10-CM | POA: Diagnosis not present

## 2020-09-09 DIAGNOSIS — Z992 Dependence on renal dialysis: Secondary | ICD-10-CM | POA: Diagnosis not present

## 2020-09-09 DIAGNOSIS — N186 End stage renal disease: Secondary | ICD-10-CM | POA: Diagnosis not present

## 2020-09-10 DIAGNOSIS — U071 COVID-19: Secondary | ICD-10-CM | POA: Diagnosis not present

## 2020-09-10 DIAGNOSIS — N186 End stage renal disease: Secondary | ICD-10-CM | POA: Diagnosis not present

## 2020-09-10 DIAGNOSIS — N2581 Secondary hyperparathyroidism of renal origin: Secondary | ICD-10-CM | POA: Diagnosis not present

## 2020-09-10 DIAGNOSIS — Z992 Dependence on renal dialysis: Secondary | ICD-10-CM | POA: Diagnosis not present

## 2020-09-10 DIAGNOSIS — D631 Anemia in chronic kidney disease: Secondary | ICD-10-CM | POA: Diagnosis not present

## 2020-09-12 DIAGNOSIS — N186 End stage renal disease: Secondary | ICD-10-CM | POA: Diagnosis not present

## 2020-09-12 DIAGNOSIS — Z992 Dependence on renal dialysis: Secondary | ICD-10-CM | POA: Diagnosis not present

## 2020-09-12 DIAGNOSIS — D631 Anemia in chronic kidney disease: Secondary | ICD-10-CM | POA: Diagnosis not present

## 2020-09-12 DIAGNOSIS — N2581 Secondary hyperparathyroidism of renal origin: Secondary | ICD-10-CM | POA: Diagnosis not present

## 2020-09-15 DIAGNOSIS — Z992 Dependence on renal dialysis: Secondary | ICD-10-CM | POA: Diagnosis not present

## 2020-09-15 DIAGNOSIS — D631 Anemia in chronic kidney disease: Secondary | ICD-10-CM | POA: Diagnosis not present

## 2020-09-15 DIAGNOSIS — N2581 Secondary hyperparathyroidism of renal origin: Secondary | ICD-10-CM | POA: Diagnosis not present

## 2020-09-15 DIAGNOSIS — N186 End stage renal disease: Secondary | ICD-10-CM | POA: Diagnosis not present

## 2020-09-17 DIAGNOSIS — Z992 Dependence on renal dialysis: Secondary | ICD-10-CM | POA: Diagnosis not present

## 2020-09-17 DIAGNOSIS — N186 End stage renal disease: Secondary | ICD-10-CM | POA: Diagnosis not present

## 2020-09-17 DIAGNOSIS — N2581 Secondary hyperparathyroidism of renal origin: Secondary | ICD-10-CM | POA: Diagnosis not present

## 2020-09-17 DIAGNOSIS — D631 Anemia in chronic kidney disease: Secondary | ICD-10-CM | POA: Diagnosis not present

## 2020-09-19 DIAGNOSIS — N2581 Secondary hyperparathyroidism of renal origin: Secondary | ICD-10-CM | POA: Diagnosis not present

## 2020-09-19 DIAGNOSIS — N186 End stage renal disease: Secondary | ICD-10-CM | POA: Diagnosis not present

## 2020-09-19 DIAGNOSIS — D631 Anemia in chronic kidney disease: Secondary | ICD-10-CM | POA: Diagnosis not present

## 2020-09-19 DIAGNOSIS — Z992 Dependence on renal dialysis: Secondary | ICD-10-CM | POA: Diagnosis not present

## 2020-09-22 DIAGNOSIS — N2581 Secondary hyperparathyroidism of renal origin: Secondary | ICD-10-CM | POA: Diagnosis not present

## 2020-09-22 DIAGNOSIS — D631 Anemia in chronic kidney disease: Secondary | ICD-10-CM | POA: Diagnosis not present

## 2020-09-22 DIAGNOSIS — N186 End stage renal disease: Secondary | ICD-10-CM | POA: Diagnosis not present

## 2020-09-22 DIAGNOSIS — Z992 Dependence on renal dialysis: Secondary | ICD-10-CM | POA: Diagnosis not present

## 2020-09-24 DIAGNOSIS — N2581 Secondary hyperparathyroidism of renal origin: Secondary | ICD-10-CM | POA: Diagnosis not present

## 2020-09-24 DIAGNOSIS — N186 End stage renal disease: Secondary | ICD-10-CM | POA: Diagnosis not present

## 2020-09-24 DIAGNOSIS — D631 Anemia in chronic kidney disease: Secondary | ICD-10-CM | POA: Diagnosis not present

## 2020-09-24 DIAGNOSIS — Z992 Dependence on renal dialysis: Secondary | ICD-10-CM | POA: Diagnosis not present

## 2020-09-26 DIAGNOSIS — Z992 Dependence on renal dialysis: Secondary | ICD-10-CM | POA: Diagnosis not present

## 2020-09-26 DIAGNOSIS — D631 Anemia in chronic kidney disease: Secondary | ICD-10-CM | POA: Diagnosis not present

## 2020-09-26 DIAGNOSIS — N186 End stage renal disease: Secondary | ICD-10-CM | POA: Diagnosis not present

## 2020-09-26 DIAGNOSIS — N2581 Secondary hyperparathyroidism of renal origin: Secondary | ICD-10-CM | POA: Diagnosis not present

## 2020-09-29 DIAGNOSIS — Z992 Dependence on renal dialysis: Secondary | ICD-10-CM | POA: Diagnosis not present

## 2020-09-29 DIAGNOSIS — N186 End stage renal disease: Secondary | ICD-10-CM | POA: Diagnosis not present

## 2020-09-29 DIAGNOSIS — D631 Anemia in chronic kidney disease: Secondary | ICD-10-CM | POA: Diagnosis not present

## 2020-09-29 DIAGNOSIS — N2581 Secondary hyperparathyroidism of renal origin: Secondary | ICD-10-CM | POA: Diagnosis not present

## 2020-10-01 DIAGNOSIS — D631 Anemia in chronic kidney disease: Secondary | ICD-10-CM | POA: Diagnosis not present

## 2020-10-01 DIAGNOSIS — N2581 Secondary hyperparathyroidism of renal origin: Secondary | ICD-10-CM | POA: Diagnosis not present

## 2020-10-01 DIAGNOSIS — N186 End stage renal disease: Secondary | ICD-10-CM | POA: Diagnosis not present

## 2020-10-01 DIAGNOSIS — Z992 Dependence on renal dialysis: Secondary | ICD-10-CM | POA: Diagnosis not present

## 2020-10-03 DIAGNOSIS — N2581 Secondary hyperparathyroidism of renal origin: Secondary | ICD-10-CM | POA: Diagnosis not present

## 2020-10-03 DIAGNOSIS — Z992 Dependence on renal dialysis: Secondary | ICD-10-CM | POA: Diagnosis not present

## 2020-10-03 DIAGNOSIS — N186 End stage renal disease: Secondary | ICD-10-CM | POA: Diagnosis not present

## 2020-10-03 DIAGNOSIS — D631 Anemia in chronic kidney disease: Secondary | ICD-10-CM | POA: Diagnosis not present

## 2020-10-06 DIAGNOSIS — Z992 Dependence on renal dialysis: Secondary | ICD-10-CM | POA: Diagnosis not present

## 2020-10-06 DIAGNOSIS — N2581 Secondary hyperparathyroidism of renal origin: Secondary | ICD-10-CM | POA: Diagnosis not present

## 2020-10-06 DIAGNOSIS — N186 End stage renal disease: Secondary | ICD-10-CM | POA: Diagnosis not present

## 2020-10-06 DIAGNOSIS — D631 Anemia in chronic kidney disease: Secondary | ICD-10-CM | POA: Diagnosis not present

## 2020-10-08 ENCOUNTER — Other Ambulatory Visit: Payer: Self-pay | Admitting: Family Medicine

## 2020-10-08 DIAGNOSIS — D631 Anemia in chronic kidney disease: Secondary | ICD-10-CM | POA: Diagnosis not present

## 2020-10-08 DIAGNOSIS — N2581 Secondary hyperparathyroidism of renal origin: Secondary | ICD-10-CM | POA: Diagnosis not present

## 2020-10-08 DIAGNOSIS — N186 End stage renal disease: Secondary | ICD-10-CM | POA: Diagnosis not present

## 2020-10-08 DIAGNOSIS — Z992 Dependence on renal dialysis: Secondary | ICD-10-CM | POA: Diagnosis not present

## 2020-10-09 DIAGNOSIS — N186 End stage renal disease: Secondary | ICD-10-CM | POA: Diagnosis not present

## 2020-10-09 DIAGNOSIS — Z992 Dependence on renal dialysis: Secondary | ICD-10-CM | POA: Diagnosis not present

## 2020-10-10 DIAGNOSIS — N2581 Secondary hyperparathyroidism of renal origin: Secondary | ICD-10-CM | POA: Diagnosis not present

## 2020-10-10 DIAGNOSIS — Z992 Dependence on renal dialysis: Secondary | ICD-10-CM | POA: Diagnosis not present

## 2020-10-10 DIAGNOSIS — D631 Anemia in chronic kidney disease: Secondary | ICD-10-CM | POA: Diagnosis not present

## 2020-10-10 DIAGNOSIS — N186 End stage renal disease: Secondary | ICD-10-CM | POA: Diagnosis not present

## 2020-10-10 DIAGNOSIS — Z23 Encounter for immunization: Secondary | ICD-10-CM | POA: Diagnosis not present

## 2020-10-10 DIAGNOSIS — D509 Iron deficiency anemia, unspecified: Secondary | ICD-10-CM | POA: Diagnosis not present

## 2020-10-13 DIAGNOSIS — D509 Iron deficiency anemia, unspecified: Secondary | ICD-10-CM | POA: Diagnosis not present

## 2020-10-13 DIAGNOSIS — E119 Type 2 diabetes mellitus without complications: Secondary | ICD-10-CM | POA: Diagnosis not present

## 2020-10-13 DIAGNOSIS — Z794 Long term (current) use of insulin: Secondary | ICD-10-CM | POA: Diagnosis not present

## 2020-10-13 DIAGNOSIS — Z23 Encounter for immunization: Secondary | ICD-10-CM | POA: Diagnosis not present

## 2020-10-13 DIAGNOSIS — Z992 Dependence on renal dialysis: Secondary | ICD-10-CM | POA: Diagnosis not present

## 2020-10-13 DIAGNOSIS — N2581 Secondary hyperparathyroidism of renal origin: Secondary | ICD-10-CM | POA: Diagnosis not present

## 2020-10-13 DIAGNOSIS — D631 Anemia in chronic kidney disease: Secondary | ICD-10-CM | POA: Diagnosis not present

## 2020-10-13 DIAGNOSIS — N186 End stage renal disease: Secondary | ICD-10-CM | POA: Diagnosis not present

## 2020-10-15 DIAGNOSIS — N186 End stage renal disease: Secondary | ICD-10-CM | POA: Diagnosis not present

## 2020-10-15 DIAGNOSIS — Z992 Dependence on renal dialysis: Secondary | ICD-10-CM | POA: Diagnosis not present

## 2020-10-15 DIAGNOSIS — Z23 Encounter for immunization: Secondary | ICD-10-CM | POA: Diagnosis not present

## 2020-10-15 DIAGNOSIS — N2581 Secondary hyperparathyroidism of renal origin: Secondary | ICD-10-CM | POA: Diagnosis not present

## 2020-10-15 DIAGNOSIS — D509 Iron deficiency anemia, unspecified: Secondary | ICD-10-CM | POA: Diagnosis not present

## 2020-10-15 DIAGNOSIS — D631 Anemia in chronic kidney disease: Secondary | ICD-10-CM | POA: Diagnosis not present

## 2020-10-17 DIAGNOSIS — Z23 Encounter for immunization: Secondary | ICD-10-CM | POA: Diagnosis not present

## 2020-10-17 DIAGNOSIS — D631 Anemia in chronic kidney disease: Secondary | ICD-10-CM | POA: Diagnosis not present

## 2020-10-17 DIAGNOSIS — D509 Iron deficiency anemia, unspecified: Secondary | ICD-10-CM | POA: Diagnosis not present

## 2020-10-17 DIAGNOSIS — N2581 Secondary hyperparathyroidism of renal origin: Secondary | ICD-10-CM | POA: Diagnosis not present

## 2020-10-17 DIAGNOSIS — N186 End stage renal disease: Secondary | ICD-10-CM | POA: Diagnosis not present

## 2020-10-17 DIAGNOSIS — Z992 Dependence on renal dialysis: Secondary | ICD-10-CM | POA: Diagnosis not present

## 2020-10-20 DIAGNOSIS — N186 End stage renal disease: Secondary | ICD-10-CM | POA: Diagnosis not present

## 2020-10-20 DIAGNOSIS — Z23 Encounter for immunization: Secondary | ICD-10-CM | POA: Diagnosis not present

## 2020-10-20 DIAGNOSIS — D631 Anemia in chronic kidney disease: Secondary | ICD-10-CM | POA: Diagnosis not present

## 2020-10-20 DIAGNOSIS — Z992 Dependence on renal dialysis: Secondary | ICD-10-CM | POA: Diagnosis not present

## 2020-10-20 DIAGNOSIS — N2581 Secondary hyperparathyroidism of renal origin: Secondary | ICD-10-CM | POA: Diagnosis not present

## 2020-10-20 DIAGNOSIS — D509 Iron deficiency anemia, unspecified: Secondary | ICD-10-CM | POA: Diagnosis not present

## 2020-10-22 DIAGNOSIS — N2581 Secondary hyperparathyroidism of renal origin: Secondary | ICD-10-CM | POA: Diagnosis not present

## 2020-10-22 DIAGNOSIS — D509 Iron deficiency anemia, unspecified: Secondary | ICD-10-CM | POA: Diagnosis not present

## 2020-10-22 DIAGNOSIS — Z23 Encounter for immunization: Secondary | ICD-10-CM | POA: Diagnosis not present

## 2020-10-22 DIAGNOSIS — Z992 Dependence on renal dialysis: Secondary | ICD-10-CM | POA: Diagnosis not present

## 2020-10-22 DIAGNOSIS — D631 Anemia in chronic kidney disease: Secondary | ICD-10-CM | POA: Diagnosis not present

## 2020-10-22 DIAGNOSIS — N186 End stage renal disease: Secondary | ICD-10-CM | POA: Diagnosis not present

## 2020-10-23 ENCOUNTER — Other Ambulatory Visit: Payer: Self-pay | Admitting: Family Medicine

## 2020-10-24 DIAGNOSIS — N2581 Secondary hyperparathyroidism of renal origin: Secondary | ICD-10-CM | POA: Diagnosis not present

## 2020-10-24 DIAGNOSIS — D509 Iron deficiency anemia, unspecified: Secondary | ICD-10-CM | POA: Diagnosis not present

## 2020-10-24 DIAGNOSIS — N186 End stage renal disease: Secondary | ICD-10-CM | POA: Diagnosis not present

## 2020-10-24 DIAGNOSIS — Z992 Dependence on renal dialysis: Secondary | ICD-10-CM | POA: Diagnosis not present

## 2020-10-24 DIAGNOSIS — Z23 Encounter for immunization: Secondary | ICD-10-CM | POA: Diagnosis not present

## 2020-10-24 DIAGNOSIS — D631 Anemia in chronic kidney disease: Secondary | ICD-10-CM | POA: Diagnosis not present

## 2020-10-26 ENCOUNTER — Telehealth: Payer: Self-pay | Admitting: Family Medicine

## 2020-10-26 NOTE — Telephone Encounter (Signed)
OV 06/26/20 notes: #1 type 2 diabetes well controlled by recent A1c of 6.8%.  He is in getting this monitored regularly through nephrology currently.  Continue current insulin regimen

## 2020-10-27 DIAGNOSIS — Z992 Dependence on renal dialysis: Secondary | ICD-10-CM | POA: Diagnosis not present

## 2020-10-27 DIAGNOSIS — N186 End stage renal disease: Secondary | ICD-10-CM | POA: Diagnosis not present

## 2020-10-27 DIAGNOSIS — N2581 Secondary hyperparathyroidism of renal origin: Secondary | ICD-10-CM | POA: Diagnosis not present

## 2020-10-27 DIAGNOSIS — D509 Iron deficiency anemia, unspecified: Secondary | ICD-10-CM | POA: Diagnosis not present

## 2020-10-27 DIAGNOSIS — D631 Anemia in chronic kidney disease: Secondary | ICD-10-CM | POA: Diagnosis not present

## 2020-10-27 DIAGNOSIS — Z23 Encounter for immunization: Secondary | ICD-10-CM | POA: Diagnosis not present

## 2020-10-29 DIAGNOSIS — D509 Iron deficiency anemia, unspecified: Secondary | ICD-10-CM | POA: Diagnosis not present

## 2020-10-29 DIAGNOSIS — Z992 Dependence on renal dialysis: Secondary | ICD-10-CM | POA: Diagnosis not present

## 2020-10-29 DIAGNOSIS — Z23 Encounter for immunization: Secondary | ICD-10-CM | POA: Diagnosis not present

## 2020-10-29 DIAGNOSIS — D631 Anemia in chronic kidney disease: Secondary | ICD-10-CM | POA: Diagnosis not present

## 2020-10-29 DIAGNOSIS — N186 End stage renal disease: Secondary | ICD-10-CM | POA: Diagnosis not present

## 2020-10-29 DIAGNOSIS — N2581 Secondary hyperparathyroidism of renal origin: Secondary | ICD-10-CM | POA: Diagnosis not present

## 2020-10-29 NOTE — Telephone Encounter (Signed)
Rx has already been sent in. Left a detailed message on verified voice mail informing the patient.

## 2020-10-29 NOTE — Telephone Encounter (Signed)
Patient's wife Manuela Schwartz called to check on the status of this prescription request for Continuous Blood Gluc Sensor (FREESTYLE LIBRE 14 DAY SENSOR) MISC. She states that the patient is almost out and is needing the refill soon.  Please advise.

## 2020-10-31 DIAGNOSIS — Z23 Encounter for immunization: Secondary | ICD-10-CM | POA: Diagnosis not present

## 2020-10-31 DIAGNOSIS — D631 Anemia in chronic kidney disease: Secondary | ICD-10-CM | POA: Diagnosis not present

## 2020-10-31 DIAGNOSIS — N186 End stage renal disease: Secondary | ICD-10-CM | POA: Diagnosis not present

## 2020-10-31 DIAGNOSIS — N2581 Secondary hyperparathyroidism of renal origin: Secondary | ICD-10-CM | POA: Diagnosis not present

## 2020-10-31 DIAGNOSIS — Z992 Dependence on renal dialysis: Secondary | ICD-10-CM | POA: Diagnosis not present

## 2020-10-31 DIAGNOSIS — D509 Iron deficiency anemia, unspecified: Secondary | ICD-10-CM | POA: Diagnosis not present

## 2020-11-03 DIAGNOSIS — D509 Iron deficiency anemia, unspecified: Secondary | ICD-10-CM | POA: Diagnosis not present

## 2020-11-03 DIAGNOSIS — D631 Anemia in chronic kidney disease: Secondary | ICD-10-CM | POA: Diagnosis not present

## 2020-11-03 DIAGNOSIS — Z23 Encounter for immunization: Secondary | ICD-10-CM | POA: Diagnosis not present

## 2020-11-03 DIAGNOSIS — Z992 Dependence on renal dialysis: Secondary | ICD-10-CM | POA: Diagnosis not present

## 2020-11-03 DIAGNOSIS — N186 End stage renal disease: Secondary | ICD-10-CM | POA: Diagnosis not present

## 2020-11-03 DIAGNOSIS — N2581 Secondary hyperparathyroidism of renal origin: Secondary | ICD-10-CM | POA: Diagnosis not present

## 2020-11-05 DIAGNOSIS — Z23 Encounter for immunization: Secondary | ICD-10-CM | POA: Diagnosis not present

## 2020-11-05 DIAGNOSIS — N2581 Secondary hyperparathyroidism of renal origin: Secondary | ICD-10-CM | POA: Diagnosis not present

## 2020-11-05 DIAGNOSIS — D631 Anemia in chronic kidney disease: Secondary | ICD-10-CM | POA: Diagnosis not present

## 2020-11-05 DIAGNOSIS — N186 End stage renal disease: Secondary | ICD-10-CM | POA: Diagnosis not present

## 2020-11-05 DIAGNOSIS — Z992 Dependence on renal dialysis: Secondary | ICD-10-CM | POA: Diagnosis not present

## 2020-11-05 DIAGNOSIS — D509 Iron deficiency anemia, unspecified: Secondary | ICD-10-CM | POA: Diagnosis not present

## 2020-11-07 DIAGNOSIS — D509 Iron deficiency anemia, unspecified: Secondary | ICD-10-CM | POA: Diagnosis not present

## 2020-11-07 DIAGNOSIS — N2581 Secondary hyperparathyroidism of renal origin: Secondary | ICD-10-CM | POA: Diagnosis not present

## 2020-11-07 DIAGNOSIS — Z992 Dependence on renal dialysis: Secondary | ICD-10-CM | POA: Diagnosis not present

## 2020-11-07 DIAGNOSIS — N186 End stage renal disease: Secondary | ICD-10-CM | POA: Diagnosis not present

## 2020-11-07 DIAGNOSIS — Z23 Encounter for immunization: Secondary | ICD-10-CM | POA: Diagnosis not present

## 2020-11-07 DIAGNOSIS — D631 Anemia in chronic kidney disease: Secondary | ICD-10-CM | POA: Diagnosis not present

## 2020-11-09 DIAGNOSIS — Z992 Dependence on renal dialysis: Secondary | ICD-10-CM | POA: Diagnosis not present

## 2020-11-09 DIAGNOSIS — N186 End stage renal disease: Secondary | ICD-10-CM | POA: Diagnosis not present

## 2020-11-10 DIAGNOSIS — D509 Iron deficiency anemia, unspecified: Secondary | ICD-10-CM | POA: Diagnosis not present

## 2020-11-10 DIAGNOSIS — Z20828 Contact with and (suspected) exposure to other viral communicable diseases: Secondary | ICD-10-CM | POA: Diagnosis not present

## 2020-11-10 DIAGNOSIS — D631 Anemia in chronic kidney disease: Secondary | ICD-10-CM | POA: Diagnosis not present

## 2020-11-10 DIAGNOSIS — Z992 Dependence on renal dialysis: Secondary | ICD-10-CM | POA: Diagnosis not present

## 2020-11-10 DIAGNOSIS — N2581 Secondary hyperparathyroidism of renal origin: Secondary | ICD-10-CM | POA: Diagnosis not present

## 2020-11-10 DIAGNOSIS — N186 End stage renal disease: Secondary | ICD-10-CM | POA: Diagnosis not present

## 2020-11-12 DIAGNOSIS — N186 End stage renal disease: Secondary | ICD-10-CM | POA: Diagnosis not present

## 2020-11-12 DIAGNOSIS — D509 Iron deficiency anemia, unspecified: Secondary | ICD-10-CM | POA: Diagnosis not present

## 2020-11-12 DIAGNOSIS — D631 Anemia in chronic kidney disease: Secondary | ICD-10-CM | POA: Diagnosis not present

## 2020-11-12 DIAGNOSIS — Z992 Dependence on renal dialysis: Secondary | ICD-10-CM | POA: Diagnosis not present

## 2020-11-12 DIAGNOSIS — N2581 Secondary hyperparathyroidism of renal origin: Secondary | ICD-10-CM | POA: Diagnosis not present

## 2020-11-14 DIAGNOSIS — N2581 Secondary hyperparathyroidism of renal origin: Secondary | ICD-10-CM | POA: Diagnosis not present

## 2020-11-14 DIAGNOSIS — N186 End stage renal disease: Secondary | ICD-10-CM | POA: Diagnosis not present

## 2020-11-14 DIAGNOSIS — D509 Iron deficiency anemia, unspecified: Secondary | ICD-10-CM | POA: Diagnosis not present

## 2020-11-14 DIAGNOSIS — Z992 Dependence on renal dialysis: Secondary | ICD-10-CM | POA: Diagnosis not present

## 2020-11-14 DIAGNOSIS — D631 Anemia in chronic kidney disease: Secondary | ICD-10-CM | POA: Diagnosis not present

## 2020-11-16 ENCOUNTER — Other Ambulatory Visit: Payer: Self-pay | Admitting: Family Medicine

## 2020-11-16 NOTE — Telephone Encounter (Signed)
Please advise. Should the patient continue this?

## 2020-11-17 DIAGNOSIS — D631 Anemia in chronic kidney disease: Secondary | ICD-10-CM | POA: Diagnosis not present

## 2020-11-17 DIAGNOSIS — N2581 Secondary hyperparathyroidism of renal origin: Secondary | ICD-10-CM | POA: Diagnosis not present

## 2020-11-17 DIAGNOSIS — N186 End stage renal disease: Secondary | ICD-10-CM | POA: Diagnosis not present

## 2020-11-17 DIAGNOSIS — Z992 Dependence on renal dialysis: Secondary | ICD-10-CM | POA: Diagnosis not present

## 2020-11-17 DIAGNOSIS — D509 Iron deficiency anemia, unspecified: Secondary | ICD-10-CM | POA: Diagnosis not present

## 2020-11-18 ENCOUNTER — Other Ambulatory Visit: Payer: Self-pay | Admitting: Family Medicine

## 2020-11-19 DIAGNOSIS — N2581 Secondary hyperparathyroidism of renal origin: Secondary | ICD-10-CM | POA: Diagnosis not present

## 2020-11-19 DIAGNOSIS — D509 Iron deficiency anemia, unspecified: Secondary | ICD-10-CM | POA: Diagnosis not present

## 2020-11-19 DIAGNOSIS — Z992 Dependence on renal dialysis: Secondary | ICD-10-CM | POA: Diagnosis not present

## 2020-11-19 DIAGNOSIS — N186 End stage renal disease: Secondary | ICD-10-CM | POA: Diagnosis not present

## 2020-11-19 DIAGNOSIS — D631 Anemia in chronic kidney disease: Secondary | ICD-10-CM | POA: Diagnosis not present

## 2020-11-21 DIAGNOSIS — D631 Anemia in chronic kidney disease: Secondary | ICD-10-CM | POA: Diagnosis not present

## 2020-11-21 DIAGNOSIS — N2581 Secondary hyperparathyroidism of renal origin: Secondary | ICD-10-CM | POA: Diagnosis not present

## 2020-11-21 DIAGNOSIS — N186 End stage renal disease: Secondary | ICD-10-CM | POA: Diagnosis not present

## 2020-11-21 DIAGNOSIS — Z992 Dependence on renal dialysis: Secondary | ICD-10-CM | POA: Diagnosis not present

## 2020-11-21 DIAGNOSIS — D509 Iron deficiency anemia, unspecified: Secondary | ICD-10-CM | POA: Diagnosis not present

## 2020-11-24 DIAGNOSIS — N2581 Secondary hyperparathyroidism of renal origin: Secondary | ICD-10-CM | POA: Diagnosis not present

## 2020-11-24 DIAGNOSIS — D631 Anemia in chronic kidney disease: Secondary | ICD-10-CM | POA: Diagnosis not present

## 2020-11-24 DIAGNOSIS — D509 Iron deficiency anemia, unspecified: Secondary | ICD-10-CM | POA: Diagnosis not present

## 2020-11-24 DIAGNOSIS — N186 End stage renal disease: Secondary | ICD-10-CM | POA: Diagnosis not present

## 2020-11-24 DIAGNOSIS — Z992 Dependence on renal dialysis: Secondary | ICD-10-CM | POA: Diagnosis not present

## 2020-11-26 DIAGNOSIS — N2581 Secondary hyperparathyroidism of renal origin: Secondary | ICD-10-CM | POA: Diagnosis not present

## 2020-11-26 DIAGNOSIS — D509 Iron deficiency anemia, unspecified: Secondary | ICD-10-CM | POA: Diagnosis not present

## 2020-11-26 DIAGNOSIS — N186 End stage renal disease: Secondary | ICD-10-CM | POA: Diagnosis not present

## 2020-11-26 DIAGNOSIS — D631 Anemia in chronic kidney disease: Secondary | ICD-10-CM | POA: Diagnosis not present

## 2020-11-26 DIAGNOSIS — Z992 Dependence on renal dialysis: Secondary | ICD-10-CM | POA: Diagnosis not present

## 2020-11-28 DIAGNOSIS — D509 Iron deficiency anemia, unspecified: Secondary | ICD-10-CM | POA: Diagnosis not present

## 2020-11-28 DIAGNOSIS — N2581 Secondary hyperparathyroidism of renal origin: Secondary | ICD-10-CM | POA: Diagnosis not present

## 2020-11-28 DIAGNOSIS — N186 End stage renal disease: Secondary | ICD-10-CM | POA: Diagnosis not present

## 2020-11-28 DIAGNOSIS — Z992 Dependence on renal dialysis: Secondary | ICD-10-CM | POA: Diagnosis not present

## 2020-11-28 DIAGNOSIS — D631 Anemia in chronic kidney disease: Secondary | ICD-10-CM | POA: Diagnosis not present

## 2020-12-01 DIAGNOSIS — N186 End stage renal disease: Secondary | ICD-10-CM | POA: Diagnosis not present

## 2020-12-01 DIAGNOSIS — D631 Anemia in chronic kidney disease: Secondary | ICD-10-CM | POA: Diagnosis not present

## 2020-12-01 DIAGNOSIS — Z992 Dependence on renal dialysis: Secondary | ICD-10-CM | POA: Diagnosis not present

## 2020-12-01 DIAGNOSIS — D509 Iron deficiency anemia, unspecified: Secondary | ICD-10-CM | POA: Diagnosis not present

## 2020-12-01 DIAGNOSIS — N2581 Secondary hyperparathyroidism of renal origin: Secondary | ICD-10-CM | POA: Diagnosis not present

## 2020-12-03 DIAGNOSIS — Z992 Dependence on renal dialysis: Secondary | ICD-10-CM | POA: Diagnosis not present

## 2020-12-03 DIAGNOSIS — D509 Iron deficiency anemia, unspecified: Secondary | ICD-10-CM | POA: Diagnosis not present

## 2020-12-03 DIAGNOSIS — D631 Anemia in chronic kidney disease: Secondary | ICD-10-CM | POA: Diagnosis not present

## 2020-12-03 DIAGNOSIS — N186 End stage renal disease: Secondary | ICD-10-CM | POA: Diagnosis not present

## 2020-12-03 DIAGNOSIS — N2581 Secondary hyperparathyroidism of renal origin: Secondary | ICD-10-CM | POA: Diagnosis not present

## 2020-12-05 DIAGNOSIS — D631 Anemia in chronic kidney disease: Secondary | ICD-10-CM | POA: Diagnosis not present

## 2020-12-05 DIAGNOSIS — N2581 Secondary hyperparathyroidism of renal origin: Secondary | ICD-10-CM | POA: Diagnosis not present

## 2020-12-05 DIAGNOSIS — Z992 Dependence on renal dialysis: Secondary | ICD-10-CM | POA: Diagnosis not present

## 2020-12-05 DIAGNOSIS — D509 Iron deficiency anemia, unspecified: Secondary | ICD-10-CM | POA: Diagnosis not present

## 2020-12-05 DIAGNOSIS — N186 End stage renal disease: Secondary | ICD-10-CM | POA: Diagnosis not present

## 2020-12-07 ENCOUNTER — Other Ambulatory Visit: Payer: Self-pay | Admitting: Family Medicine

## 2020-12-08 DIAGNOSIS — D509 Iron deficiency anemia, unspecified: Secondary | ICD-10-CM | POA: Diagnosis not present

## 2020-12-08 DIAGNOSIS — D631 Anemia in chronic kidney disease: Secondary | ICD-10-CM | POA: Diagnosis not present

## 2020-12-08 DIAGNOSIS — Z992 Dependence on renal dialysis: Secondary | ICD-10-CM | POA: Diagnosis not present

## 2020-12-08 DIAGNOSIS — N2581 Secondary hyperparathyroidism of renal origin: Secondary | ICD-10-CM | POA: Diagnosis not present

## 2020-12-08 DIAGNOSIS — N186 End stage renal disease: Secondary | ICD-10-CM | POA: Diagnosis not present

## 2020-12-09 DIAGNOSIS — Z992 Dependence on renal dialysis: Secondary | ICD-10-CM | POA: Diagnosis not present

## 2020-12-09 DIAGNOSIS — N186 End stage renal disease: Secondary | ICD-10-CM | POA: Diagnosis not present

## 2020-12-10 DIAGNOSIS — D631 Anemia in chronic kidney disease: Secondary | ICD-10-CM | POA: Diagnosis not present

## 2020-12-10 DIAGNOSIS — Z992 Dependence on renal dialysis: Secondary | ICD-10-CM | POA: Diagnosis not present

## 2020-12-10 DIAGNOSIS — N186 End stage renal disease: Secondary | ICD-10-CM | POA: Diagnosis not present

## 2020-12-10 DIAGNOSIS — N2581 Secondary hyperparathyroidism of renal origin: Secondary | ICD-10-CM | POA: Diagnosis not present

## 2020-12-10 DIAGNOSIS — D509 Iron deficiency anemia, unspecified: Secondary | ICD-10-CM | POA: Diagnosis not present

## 2020-12-12 DIAGNOSIS — Z992 Dependence on renal dialysis: Secondary | ICD-10-CM | POA: Diagnosis not present

## 2020-12-12 DIAGNOSIS — D631 Anemia in chronic kidney disease: Secondary | ICD-10-CM | POA: Diagnosis not present

## 2020-12-12 DIAGNOSIS — N186 End stage renal disease: Secondary | ICD-10-CM | POA: Diagnosis not present

## 2020-12-12 DIAGNOSIS — D509 Iron deficiency anemia, unspecified: Secondary | ICD-10-CM | POA: Diagnosis not present

## 2020-12-12 DIAGNOSIS — N2581 Secondary hyperparathyroidism of renal origin: Secondary | ICD-10-CM | POA: Diagnosis not present

## 2020-12-15 DIAGNOSIS — Z992 Dependence on renal dialysis: Secondary | ICD-10-CM | POA: Diagnosis not present

## 2020-12-15 DIAGNOSIS — N2581 Secondary hyperparathyroidism of renal origin: Secondary | ICD-10-CM | POA: Diagnosis not present

## 2020-12-15 DIAGNOSIS — N186 End stage renal disease: Secondary | ICD-10-CM | POA: Diagnosis not present

## 2020-12-15 DIAGNOSIS — D509 Iron deficiency anemia, unspecified: Secondary | ICD-10-CM | POA: Diagnosis not present

## 2020-12-15 DIAGNOSIS — D631 Anemia in chronic kidney disease: Secondary | ICD-10-CM | POA: Diagnosis not present

## 2020-12-17 DIAGNOSIS — N186 End stage renal disease: Secondary | ICD-10-CM | POA: Diagnosis not present

## 2020-12-17 DIAGNOSIS — N2581 Secondary hyperparathyroidism of renal origin: Secondary | ICD-10-CM | POA: Diagnosis not present

## 2020-12-17 DIAGNOSIS — Z992 Dependence on renal dialysis: Secondary | ICD-10-CM | POA: Diagnosis not present

## 2020-12-17 DIAGNOSIS — D509 Iron deficiency anemia, unspecified: Secondary | ICD-10-CM | POA: Diagnosis not present

## 2020-12-17 DIAGNOSIS — D631 Anemia in chronic kidney disease: Secondary | ICD-10-CM | POA: Diagnosis not present

## 2020-12-19 DIAGNOSIS — N2581 Secondary hyperparathyroidism of renal origin: Secondary | ICD-10-CM | POA: Diagnosis not present

## 2020-12-19 DIAGNOSIS — D509 Iron deficiency anemia, unspecified: Secondary | ICD-10-CM | POA: Diagnosis not present

## 2020-12-19 DIAGNOSIS — N186 End stage renal disease: Secondary | ICD-10-CM | POA: Diagnosis not present

## 2020-12-19 DIAGNOSIS — Z992 Dependence on renal dialysis: Secondary | ICD-10-CM | POA: Diagnosis not present

## 2020-12-19 DIAGNOSIS — D631 Anemia in chronic kidney disease: Secondary | ICD-10-CM | POA: Diagnosis not present

## 2020-12-22 DIAGNOSIS — Z992 Dependence on renal dialysis: Secondary | ICD-10-CM | POA: Diagnosis not present

## 2020-12-22 DIAGNOSIS — N186 End stage renal disease: Secondary | ICD-10-CM | POA: Diagnosis not present

## 2020-12-22 DIAGNOSIS — N2581 Secondary hyperparathyroidism of renal origin: Secondary | ICD-10-CM | POA: Diagnosis not present

## 2020-12-22 DIAGNOSIS — D509 Iron deficiency anemia, unspecified: Secondary | ICD-10-CM | POA: Diagnosis not present

## 2020-12-22 DIAGNOSIS — D631 Anemia in chronic kidney disease: Secondary | ICD-10-CM | POA: Diagnosis not present

## 2020-12-24 DIAGNOSIS — N186 End stage renal disease: Secondary | ICD-10-CM | POA: Diagnosis not present

## 2020-12-24 DIAGNOSIS — Z992 Dependence on renal dialysis: Secondary | ICD-10-CM | POA: Diagnosis not present

## 2020-12-24 DIAGNOSIS — D509 Iron deficiency anemia, unspecified: Secondary | ICD-10-CM | POA: Diagnosis not present

## 2020-12-24 DIAGNOSIS — N2581 Secondary hyperparathyroidism of renal origin: Secondary | ICD-10-CM | POA: Diagnosis not present

## 2020-12-24 DIAGNOSIS — D631 Anemia in chronic kidney disease: Secondary | ICD-10-CM | POA: Diagnosis not present

## 2020-12-26 DIAGNOSIS — Z992 Dependence on renal dialysis: Secondary | ICD-10-CM | POA: Diagnosis not present

## 2020-12-26 DIAGNOSIS — D631 Anemia in chronic kidney disease: Secondary | ICD-10-CM | POA: Diagnosis not present

## 2020-12-26 DIAGNOSIS — D509 Iron deficiency anemia, unspecified: Secondary | ICD-10-CM | POA: Diagnosis not present

## 2020-12-26 DIAGNOSIS — N2581 Secondary hyperparathyroidism of renal origin: Secondary | ICD-10-CM | POA: Diagnosis not present

## 2020-12-26 DIAGNOSIS — N186 End stage renal disease: Secondary | ICD-10-CM | POA: Diagnosis not present

## 2020-12-29 DIAGNOSIS — Z992 Dependence on renal dialysis: Secondary | ICD-10-CM | POA: Diagnosis not present

## 2020-12-29 DIAGNOSIS — D509 Iron deficiency anemia, unspecified: Secondary | ICD-10-CM | POA: Diagnosis not present

## 2020-12-29 DIAGNOSIS — N2581 Secondary hyperparathyroidism of renal origin: Secondary | ICD-10-CM | POA: Diagnosis not present

## 2020-12-29 DIAGNOSIS — D631 Anemia in chronic kidney disease: Secondary | ICD-10-CM | POA: Diagnosis not present

## 2020-12-29 DIAGNOSIS — N186 End stage renal disease: Secondary | ICD-10-CM | POA: Diagnosis not present

## 2020-12-31 DIAGNOSIS — Z992 Dependence on renal dialysis: Secondary | ICD-10-CM | POA: Diagnosis not present

## 2020-12-31 DIAGNOSIS — D509 Iron deficiency anemia, unspecified: Secondary | ICD-10-CM | POA: Diagnosis not present

## 2020-12-31 DIAGNOSIS — N186 End stage renal disease: Secondary | ICD-10-CM | POA: Diagnosis not present

## 2020-12-31 DIAGNOSIS — D631 Anemia in chronic kidney disease: Secondary | ICD-10-CM | POA: Diagnosis not present

## 2020-12-31 DIAGNOSIS — N2581 Secondary hyperparathyroidism of renal origin: Secondary | ICD-10-CM | POA: Diagnosis not present

## 2021-01-02 DIAGNOSIS — D631 Anemia in chronic kidney disease: Secondary | ICD-10-CM | POA: Diagnosis not present

## 2021-01-02 DIAGNOSIS — D509 Iron deficiency anemia, unspecified: Secondary | ICD-10-CM | POA: Diagnosis not present

## 2021-01-02 DIAGNOSIS — N2581 Secondary hyperparathyroidism of renal origin: Secondary | ICD-10-CM | POA: Diagnosis not present

## 2021-01-02 DIAGNOSIS — N186 End stage renal disease: Secondary | ICD-10-CM | POA: Diagnosis not present

## 2021-01-02 DIAGNOSIS — Z992 Dependence on renal dialysis: Secondary | ICD-10-CM | POA: Diagnosis not present

## 2021-01-05 DIAGNOSIS — E119 Type 2 diabetes mellitus without complications: Secondary | ICD-10-CM | POA: Diagnosis not present

## 2021-01-05 DIAGNOSIS — D509 Iron deficiency anemia, unspecified: Secondary | ICD-10-CM | POA: Diagnosis not present

## 2021-01-05 DIAGNOSIS — Z992 Dependence on renal dialysis: Secondary | ICD-10-CM | POA: Diagnosis not present

## 2021-01-05 DIAGNOSIS — D631 Anemia in chronic kidney disease: Secondary | ICD-10-CM | POA: Diagnosis not present

## 2021-01-05 DIAGNOSIS — Z794 Long term (current) use of insulin: Secondary | ICD-10-CM | POA: Diagnosis not present

## 2021-01-05 DIAGNOSIS — N186 End stage renal disease: Secondary | ICD-10-CM | POA: Diagnosis not present

## 2021-01-05 DIAGNOSIS — N2581 Secondary hyperparathyroidism of renal origin: Secondary | ICD-10-CM | POA: Diagnosis not present

## 2021-01-07 DIAGNOSIS — Z992 Dependence on renal dialysis: Secondary | ICD-10-CM | POA: Diagnosis not present

## 2021-01-07 DIAGNOSIS — D509 Iron deficiency anemia, unspecified: Secondary | ICD-10-CM | POA: Diagnosis not present

## 2021-01-07 DIAGNOSIS — D631 Anemia in chronic kidney disease: Secondary | ICD-10-CM | POA: Diagnosis not present

## 2021-01-07 DIAGNOSIS — N2581 Secondary hyperparathyroidism of renal origin: Secondary | ICD-10-CM | POA: Diagnosis not present

## 2021-01-07 DIAGNOSIS — N186 End stage renal disease: Secondary | ICD-10-CM | POA: Diagnosis not present

## 2021-01-09 DIAGNOSIS — N2581 Secondary hyperparathyroidism of renal origin: Secondary | ICD-10-CM | POA: Diagnosis not present

## 2021-01-09 DIAGNOSIS — Z992 Dependence on renal dialysis: Secondary | ICD-10-CM | POA: Diagnosis not present

## 2021-01-09 DIAGNOSIS — D631 Anemia in chronic kidney disease: Secondary | ICD-10-CM | POA: Diagnosis not present

## 2021-01-09 DIAGNOSIS — N186 End stage renal disease: Secondary | ICD-10-CM | POA: Diagnosis not present

## 2021-01-09 DIAGNOSIS — D509 Iron deficiency anemia, unspecified: Secondary | ICD-10-CM | POA: Diagnosis not present

## 2021-01-10 ENCOUNTER — Other Ambulatory Visit: Payer: Self-pay | Admitting: Family Medicine

## 2021-01-12 DIAGNOSIS — Z992 Dependence on renal dialysis: Secondary | ICD-10-CM | POA: Diagnosis not present

## 2021-01-12 DIAGNOSIS — D631 Anemia in chronic kidney disease: Secondary | ICD-10-CM | POA: Diagnosis not present

## 2021-01-12 DIAGNOSIS — D509 Iron deficiency anemia, unspecified: Secondary | ICD-10-CM | POA: Diagnosis not present

## 2021-01-12 DIAGNOSIS — N2581 Secondary hyperparathyroidism of renal origin: Secondary | ICD-10-CM | POA: Diagnosis not present

## 2021-01-12 DIAGNOSIS — N186 End stage renal disease: Secondary | ICD-10-CM | POA: Diagnosis not present

## 2021-01-13 ENCOUNTER — Telehealth: Payer: Self-pay | Admitting: Family Medicine

## 2021-01-13 NOTE — Telephone Encounter (Signed)
Left message for patient to call back and schedule Medicare Annual Wellness Visit (AWV) either virtually or in office. Left  my Herbie Drape number [phone]   Last AWV 01/20/20 please schedule at anytime with [facility] Nurse Health Advisor 1 or 2   This should be a 45 minute visit.

## 2021-01-14 DIAGNOSIS — N2581 Secondary hyperparathyroidism of renal origin: Secondary | ICD-10-CM | POA: Diagnosis not present

## 2021-01-14 DIAGNOSIS — D509 Iron deficiency anemia, unspecified: Secondary | ICD-10-CM | POA: Diagnosis not present

## 2021-01-14 DIAGNOSIS — D631 Anemia in chronic kidney disease: Secondary | ICD-10-CM | POA: Diagnosis not present

## 2021-01-14 DIAGNOSIS — Z992 Dependence on renal dialysis: Secondary | ICD-10-CM | POA: Diagnosis not present

## 2021-01-14 DIAGNOSIS — N186 End stage renal disease: Secondary | ICD-10-CM | POA: Diagnosis not present

## 2021-01-14 NOTE — Telephone Encounter (Signed)
I returned patients call  Patient returned my call and left message

## 2021-01-16 DIAGNOSIS — Z992 Dependence on renal dialysis: Secondary | ICD-10-CM | POA: Diagnosis not present

## 2021-01-16 DIAGNOSIS — N2581 Secondary hyperparathyroidism of renal origin: Secondary | ICD-10-CM | POA: Diagnosis not present

## 2021-01-16 DIAGNOSIS — N186 End stage renal disease: Secondary | ICD-10-CM | POA: Diagnosis not present

## 2021-01-16 DIAGNOSIS — D509 Iron deficiency anemia, unspecified: Secondary | ICD-10-CM | POA: Diagnosis not present

## 2021-01-16 DIAGNOSIS — D631 Anemia in chronic kidney disease: Secondary | ICD-10-CM | POA: Diagnosis not present

## 2021-01-18 ENCOUNTER — Ambulatory Visit (INDEPENDENT_AMBULATORY_CARE_PROVIDER_SITE_OTHER): Payer: Medicare Other | Admitting: Family Medicine

## 2021-01-18 ENCOUNTER — Encounter: Payer: Self-pay | Admitting: Family Medicine

## 2021-01-18 VITALS — BP 142/60 | HR 64 | Temp 97.9°F | Wt 195.6 lb

## 2021-01-18 DIAGNOSIS — N186 End stage renal disease: Secondary | ICD-10-CM

## 2021-01-18 DIAGNOSIS — Z9181 History of falling: Secondary | ICD-10-CM

## 2021-01-18 DIAGNOSIS — Z992 Dependence on renal dialysis: Secondary | ICD-10-CM | POA: Diagnosis not present

## 2021-01-18 DIAGNOSIS — E1129 Type 2 diabetes mellitus with other diabetic kidney complication: Secondary | ICD-10-CM | POA: Diagnosis not present

## 2021-01-18 DIAGNOSIS — Z89512 Acquired absence of left leg below knee: Secondary | ICD-10-CM | POA: Diagnosis not present

## 2021-01-18 DIAGNOSIS — E1165 Type 2 diabetes mellitus with hyperglycemia: Secondary | ICD-10-CM

## 2021-01-18 DIAGNOSIS — Z89511 Acquired absence of right leg below knee: Secondary | ICD-10-CM

## 2021-01-18 LAB — POCT GLYCOSYLATED HEMOGLOBIN (HGB A1C): Hemoglobin A1C: 8.1 % — AB (ref 4.0–5.6)

## 2021-01-18 MED ORDER — FUROSEMIDE 40 MG PO TABS: 120.0000 mg | ORAL_TABLET | Freq: Every day | ORAL | 3 refills | Status: DC

## 2021-01-18 MED ORDER — ALBUTEROL SULFATE HFA 108 (90 BASE) MCG/ACT IN AERS: 2.0000 | INHALATION_SPRAY | RESPIRATORY_TRACT | 1 refills | Status: DC | PRN

## 2021-01-18 MED ORDER — GABAPENTIN 300 MG PO CAPS
600.0000 mg | ORAL_CAPSULE | Freq: Two times a day (BID) | ORAL | 1 refills | Status: DC
Start: 2021-01-18 — End: 2022-01-12

## 2021-01-18 MED ORDER — FREESTYLE LIBRE 14 DAY SENSOR MISC: 11 refills | Status: DC

## 2021-01-18 NOTE — Patient Instructions (Signed)
Sending copy of sliding scale  Go ahead and increase the Lantus to 16 units  Confirm your current dose of Gabapentin.

## 2021-01-18 NOTE — Progress Notes (Signed)
Established Patient Office Visit  Subjective:  Patient ID: Anthony Bullock, male    DOB: [birth year]  Age: 61 y.o. MRN: [id number]  CC:  Chief Complaint  Patient presents with   Follow-up    diabetes    HPI Anthony Bullock presents for medical follow-up.  He has end-stage renal disease on hemodialysis.  He has multiple other chronic problems including peripheral vascular disease, nonischemic cardiomyopathy, hypertension, chronic systolic heart failure, CAD, poorly controlled type 2 diabetes, history of bilateral below-knee amputations.  He states he had a couple recent falls.  He frequently feels off balance.  History of type 2 diabetes.  Poor compliance.  Currently on Lantus 14 units daily and sliding scale Humalog twice daily.  He does not have copy of his current sliding scale but wonders if this may need to be adjusted.  Very poor compliance with diet over the holidays.  Last A1c was 8.0%. He is also requesting refills of his freestyle libre sensor  He is requesting refills of gabapentin.  We had 2 different doses on his med list including 100 mg and 300 mg.  He is not sure which when he is actually taking.    Uses albuterol intermittently for wheezing.  No history of asthma.  Has used albuterol infrequently in the past would like to have on hand.  Also requesting refills of furosemide.  Takes 40 mg 3 tablets once daily.  Past Medical History:  Diagnosis Date   AICD (automatic cardioverter/defibrillator) present    [employer]   Allergic rhinitis    Anemia    Arthritis    Chronic systolic heart failure ([place])    a. ECHO (12/2012) EF 25-30%, HK entireanteroseptal myocardium //  b.  EF 25%, diffuse HK, grade 1 diastolic dysfunction, MAC, mild LAE, normal RVSF, trivial pericardial effusion   COPD (chronic obstructive pulmonary disease) ([place])    Diabetes mellitus type II    Diabetic nephropathy ([place])    Diabetic neuropathy ([place])    ESRD on hemodialysis ([place])    started HD June  2017, goes to [facility] HD unit, Dr Hinda Lenis   History of cardiac catheterization    a.Myoview 1/15:  There is significant left ventricular dysfunction. There may be slight scar at the apex. There is no significant ischemia. LV Ejection Fraction: 27%  //  b. RHC/LHC (1/15) with mean RA 6, PA 47/22 mean 33, mean PCWP 20, PVR 2.5 WU, CI 2.5; 80% dLAD stenosis, 70% diffuse large D.     History of kidney stones    Hyperlipidemia    Hypertension    Kidney stones    NICM (nonischemic cardiomyopathy) ([place])    Primarily nonischemic.  Echo (12/14) with EF 25-30%.  Echo (3/15) with EF 25%, mild to moderately dilated LV, normal RV size and systolic function.     Osteomyelitis ([place])    left fifth ray   Pneumonia    Urethral stricture    Wears glasses     Past Surgical History:  Procedure Laterality Date   ABDOMINAL AORTOGRAM W/LOWER EXTREMITY N/A 03/30/2016   Procedure: Abdominal Aortogram w/Lower Extremity;  Surgeon: Angelia Mould, MD;  Location: [place] [facility];  Service: Cardiovascular;  Laterality: N/A;   AMPUTATION Right 04/26/2016   Procedure: Right Below Knee Amputation;  Surgeon: Newt Minion, MD;  Location: [place];  Service: Orthopedics;  Laterality: Right;   AMPUTATION Left 08/21/2019   Procedure: LEFT FOOT 5TH RAY AMPUTATION;  Surgeon: Newt Minion, MD;  Location: [place];  Service: Orthopedics;  Laterality: Left;   AMPUTATION Left 11/13/2019   Procedure: LEFT BELOW KNEE AMPUTATION;  Surgeon: Newt Minion, MD;  Location: [place];  Service: Orthopedics;  Laterality: Left;   AV FISTULA PLACEMENT Right 09/08/2015   Procedure: INSERTION OF 4-15m x 45cm  ARTERIOVENOUS (AV) GORE-TEX GRAFT RIGHT UPPER  ARM;  Surgeon: CAngelia Mould MD;  [place]  Service: Vascular;  Laterality: Right;   AV FISTULA PLACEMENT Left 01/14/2016   Procedure: CREATION OF LEFT UPPER ARM ARTERIOVENOUS FISTULA;  Surgeon: CAngelia Mould MD;  [place]  Service: Vascular;  Laterality:  Left;   [place] 08/22/2014   Procedure: RIGHT [place]PPER ARM [place]  Surgeon: CAngelia Mould MD;  [place]  Service: Vascular;  Laterality: Right;   BELOW KNEE LEG AMPUTATION Right 04/26/2016   CARDIAC CATHETERIZATION     CARDIAC DEFIBRILLATOR PLACEMENT  06/27/2013   Sub Q       BY DR KCaryl Comes  CATARACT EXTRACTION W/PHACO Right 08/06/2018   Procedure: CATARACT EXTRACTION PHACO AND INTRAOCULAR LENS PLACEMENT ([place];  Surgeon: WBaruch Goldmann MD;  [facility];  Service: Ophthalmology;  Laterality: Right;  CDE: 4.06   CATARACT EXTRACTION W/PHACO Left 08/20/2018   Procedure: CATARACT EXTRACTION PHACO AND INTRAOCULAR LENS PLACEMENT (IOC);  Surgeon: WBaruch Goldmann MD;  Location: [facility];  Service: Ophthalmology;  Laterality: Left;  CDE: 6.76   COLONOSCOPY WITH PROPOFOL N/A 07/22/2015   Procedure: COLONOSCOPY WITH PROPOFOL;  Surgeon: HDoran Stabler MD;  [facility];  Service: Gastroenterology;  Laterality: N/A;   FEMORAL-POPLITEAL BYPASS GRAFT Right 03/31/2016   Procedure: BYPASS GRAFT FEMORAL-POPLITEAL ARTERY USING RIGHT GREATER SAPHENOUS NONREVERSED VEIN;  Surgeon: CAngelia Mould MD;  [place]  Service: Vascular;  Laterality: Right;   HERNIA REPAIR     I & D EXTREMITY Right 03/31/2016   Procedure: IRRIGATION AND DEBRIDEMENT FOOT;  Surgeon: CAngelia Mould MD;  [place]  Service: Vascular;  Laterality: Right;   IMPLANTABLE CARDIOVERTER DEFIBRILLATOR IMPLANT N/A 06/27/2013   Procedure: SUB Q ICD;  Surgeon: SDeboraha Sprang MD;  [facility];  Service: Cardiovascular;  Laterality: N/A;   INTRAOPERATIVE ARTERIOGRAM Right 03/31/2016   Procedure: INTRA OPERATIVE ARTERIOGRAM;  Surgeon: CAngelia Mould MD;  [place]  Service: Vascular;  Laterality: Right;   IR GENERIC HISTORICAL Right 11/30/2015   IR THROMBECTOMY AV FISTULA W/THROMBOLYSIS/PTA INC/SHUNT/IMG RIGHT 11/30/2015 GAletta Edouard MD [facility]   IR GENERIC HISTORICAL  11/30/2015   IR [place] ACCESS RIGHT 11/30/2015 GAletta Edouard MD [facility]   IR GENERIC HISTORICAL Right 12/15/2015   IR THROMBECTOMY AV FISTULA W/THROMBOLYSIS/PTA/STENT INC/SHUNT/IMG RT 12/15/2015 DArne Cleveland MD [facility]   IR GENERIC HISTORICAL  12/15/2015   IR [place] VASC ACCESS RIGHT 12/15/2015 DArne Cleveland MD [facility]   IR GENERIC HISTORICAL  12/28/2015   IR FLUORO GUIDE CV LINE RIGHT 12/28/2015 AMarybelle Killings MD [facility]   IR GENERIC HISTORICAL  12/28/2015   IR [place] VASC ACCESS RIGHT 12/28/2015 AMarybelle Killings MD [facility]   LEFT A ND RIGHT HEART CATH  01/30/2013   DR BSung Amabile  LEFT AND RIGHT HEART CATHETERIZATION WITH CORONARY ANGIOGRAM N/A 01/30/2013   Procedure: LEFT AND RIGHT HEART CATHETERIZATION WITH CORONARY ANGIOGRAM;  Surgeon: DJolaine Artist MD;  [facility];  Service: Cardiovascular;  Laterality: N/A;   PERIPHERAL VASCULAR CATHETERIZATION Right  01/26/2015   Procedure: A/V Fistulagram;  Surgeon: Angelia Mould, MD;  Location: [place] [facility];  Service: Cardiovascular;  Laterality: Right;   reapea urethral surgery for recurrent obstruction  2011   TOTAL KNEE ARTHROPLASTY Right 2007   VEIN HARVEST Right 03/31/2016   Procedure: RIGHT GREATER SAPHENOUS VEIN HARVEST;  Surgeon: Angelia Mould, MD;  Location[facility] OR;  Service: Vascular;  Laterality: Right;    Family History  Problem Relation Age of Onset   Bladder Cancer Mother    Alcohol abuse Father    Melanoma Father    Stroke Maternal Grandmother    Heart Problems Maternal Grandmother        unknown   Diabetes Maternal Grandmother    Heart disease Maternal Grandfather    Prostate cancer Maternal Grandfather     Social History   Socioeconomic History   Marital status: Married    Spouse name: Not on file   Number of children: 0   Years of education: Not on file   Highest education level: Not on file   Occupational History   Not on file  Tobacco Use   Smoking status: Former    Packs/day: 2.00    Years: 32.00    Pack years: 64.00    Types: Cigarettes    Quit date: 05/11/2010    Years since quitting: 10.6   Smokeless tobacco: Never  Vaping Use   Vaping Use: Never used  Substance and Sexual Activity   Alcohol use: No   Drug use: No   Sexual activity: Yes  Other Topics Concern   Not on file  Social History Narrative   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   Social Determinants of Radio broadcast assistant Strain: Low Risk    Difficulty of Paying Living Expenses: Not hard at all  Food Insecurity: No Food Insecurity   Worried About Charity fundraiser in the Last Year: Never true   Arboriculturist in the Last Year: Never true  Transportation Needs: No Transportation Needs   Lack of Transportation (Medical): No   Lack of Transportation (Non-Medical): No  Physical Activity: Inactive   Days of Exercise per Week: 0 days   Minutes of Exercise per Session: 0 min  Stress: No Stress Concern Present   Feeling of Stress : Not at all  Social Connections: Moderately Integrated   Frequency of Communication with Friends and Family: More than three times a week   Frequency of Social Gatherings with Friends and Family: Twice a week   Attends Religious Services: More than 4 times per year   Active Member of [employer] or Organizations: No   Attends Music therapist: Never   Marital Status: Married  Human resources officer Violence: Not At Risk   Fear of Current or Ex-Partner: No   Emotionally Abused: No   Physically Abused: No   Sexually Abused: No    Outpatient Medications Prior to Visit  Medication Sig Dispense Refill   acetaminophen (TYLENOL) 325 MG tablet Take 1-2 tablets (325-650 mg total) by mouth every 4 (four) hours as needed for mild pain.     aspirin EC 81 MG tablet Take 81 mg by mouth daily.      atorvastatin (LIPITOR) 80 MG tablet Take 1 tablet (80 mg total) by mouth at  bedtime. 90 tablet 3   azelastine (ASTELIN) 0.1 % nasal spray Place 2 sprays into both nostrils 3 (three) times daily as needed for rhinitis. Use in  each nostril as directed 30 mL 12   CALCITRIOL PO Take by mouth 3 (three) times a week. ONLY PROVIDED AT DIALYSIS     carvedilol (COREG) 12.5 MG tablet Take 12.5 mg by mouth 2 (two) times daily.     cinacalcet (SENSIPAR) 30 MG tablet Take 1 tablet (30 mg total) by mouth Every Tuesday,Thursday,and Saturday with dialysis. 60 tablet    Continuous Blood Gluc Sensor (FREESTYLE LIBRE 14 DAY SENSOR) MISC USE AS DIRECTED EVERY 14 DAYS 4 each 0   fenofibrate 160 MG tablet Take 1 tablet (160 mg total) by mouth daily. 90 tablet 3   fexofenadine (ALLEGRA) 180 MG tablet Take 180 mg by mouth daily.     gabapentin (NEURONTIN) 100 MG capsule Take one pill in morning and two pills at bedtime. 90 capsule 0   gabapentin (NEURONTIN) 300 MG capsule Take 2 capsules by mouth twice daily 360 capsule 1   glucose blood test strip Check 1 time daily. E11.9 One Touch Ultra Blue Test Strips 100 each 3   HUMALOG KWIKPEN 200 UNIT/ML KwikPen USE TWICE DAILY WITH LUNCH AND SUPPER PER SLIDING SCALE (MAXIMUN DAILY DOSE OF 28 UNIT) - APPOINTMENT REQUIRED FOR FURTHER REFILLS 6 mL 0   insulin glargine (LANTUS SOLOSTAR) 100 UNIT/ML Solostar Pen Inject 12 units subcutaneously at bed time. (Patient taking differently: 16 Units. Inject 16 units subcutaneously at bed time.) 15 mL 2   Insulin Pen Needle (BD PEN NEEDLE NANO U/F) 32G X 4 MM MISC USE 1 PEN NEEDLE SUBCUTANEOUSLY WITH INSULIN 4 TIMES DAILY 400 each 0   lanthanum (FOSRENOL) 500 MG chewable tablet Chew 500-1,000 mg by mouth See admin instructions. Take 1000 mg with meals three time a day and 500 mg with snacks     midodrine (PROAMATINE) 10 MG tablet Take 10 mg by mouth 3 (three) times a week.     multivitamin (RENA-VIT) TABS tablet Take 1 tablet by mouth daily. 90 tablet 3   Olopatadine HCl 0.2 % SOLN Place 1 drop into both eyes daily  as needed (for allergies).      ondansetron (ZOFRAN ODT) 8 MG disintegrating tablet Take 1 tablet (8 mg total) by mouth every 8 (eight) hours as needed for nausea or vomiting. 20 tablet 1   albuterol (VENTOLIN HFA) 108 (90 Base) MCG/ACT inhaler Inhale 2 puffs into the lungs every 4 (four) hours as needed for wheezing or shortness of breath. 1 each 1   furosemide (LASIX) 40 MG tablet Take 3 tablets by mouth once daily 270 tablet 0   No facility-administered medications prior to visit.    Allergies  Allergen Reactions   Epoetin Alfa Other (See Comments)    unknown   Ferumoxytol Other (See Comments)    unknown   Morphine Sulfate Rash and Other (See Comments)    Itches all over, red spots    ROS Review of Systems  Constitutional:  Negative for fatigue.  Eyes:  Negative for visual disturbance.  Respiratory:  Negative for cough, chest tightness and shortness of breath.   Cardiovascular:  Negative for chest pain, palpitations and leg swelling.  Endocrine: Negative for polydipsia and polyuria.  Neurological:  Negative for dizziness, syncope, weakness, light-headedness and headaches.     Objective:    Physical Exam Constitutional:      Appearance: He is well-developed.  Neck:     Thyroid: No thyromegaly.  Cardiovascular:     Rate and Rhythm: Normal rate and regular rhythm.  Pulmonary:     Effort:  Pulmonary effort is normal. No respiratory distress.     Breath sounds: Normal breath sounds. No wheezing or rales.  Musculoskeletal:     Cervical back: Neck supple.  Neurological:     Mental Status: He is alert and oriented to person, place, and time.    BP (!) 142/60 (BP Location: Left Arm, Patient Position: Sitting, Cuff Size: Normal)    Pulse 64    Temp 97.9 F (36.6 C) (Oral)    Wt 195 lb 9.6 oz (88.7 kg)    SpO2 98%    BMI 28.07 kg/m  Wt Readings from Last 3 Encounters:  01/18/21 195 lb 9.6 oz (88.7 kg)  08/24/20 196 lb (88.9 kg)  06/26/20 189 lb 4.8 oz (85.9 kg)      Health Maintenance Due  Topic Date Due   Zoster Vaccines- Shingrix (1 of 2) Never done   TETANUS/TDAP  10/04/2018   COVID-19 Vaccine (2 - Moderna risk series) 05/16/2019   FOOT EXAM  03/19/2020   OPHTHALMOLOGY EXAM  07/23/2020   INFLUENZA VACCINE  08/10/2020   Pneumococcal Vaccine 60-59 Years old (3 - PPSV23 if available, else PCV20) 09/22/2020    There are no preventive care reminders to display for this patient.  Lab Results  Component Value Date   TSH 1.61 06/26/2020   Lab Results  Component Value Date   WBC 8.0 11/28/2019   HGB 10.0 (L) 11/28/2019   HCT 32.1 (L) 11/28/2019   MCV 92.5 11/28/2019   PLT 148 (L) 11/28/2019   Lab Results  Component Value Date   NA 134 (L) 11/28/2019   K 5.6 (H) 11/28/2019   CO2 26 11/28/2019   GLUCOSE 139 (H) 11/28/2019   BUN 63 (H) 11/28/2019   CREATININE 10.21 (H) 11/28/2019   BILITOT 0.5 06/26/2020   ALKPHOS 50 09/04/2019   AST 30 06/26/2020   ALT 19 06/26/2020   PROT 6.1 06/26/2020   ALBUMIN 2.8 (L) 11/28/2019   CALCIUM 9.2 11/28/2019   ANIONGAP 12 11/28/2019   GFR 12.30 (LL) 06/27/2014   Lab Results  Component Value Date   CHOL 101 06/26/2020   Lab Results  Component Value Date   HDL 37 (L) 06/26/2020   Lab Results  Component Value Date   LDLCALC 42 06/26/2020   Lab Results  Component Value Date   TRIG 140 06/26/2020   Lab Results  Component Value Date   CHOLHDL 2.7 06/26/2020   Lab Results  Component Value Date   HGBA1C 8.1 (A) 01/18/2021      Assessment & Plan:   #1 type 2 diabetes with history of poor control and poor compliance.  A1c today 8.1%.  Increase Lantus to 16 units.  Send copy of his current sliding scale insulin and we will consider mild revision.  Recheck in 4 months.  Sent in refills of his freestyle libre sensor  #2 history of bilateral below-knee amputations.  High risk for falls.  Frequent balance issues.  Has had extensive physical therapy in the past.  We discussed fall hazards  such as throw rugs  #3 end-stage renal disease on hemodialysis.  Followed closely by nephrology.  We refilled his furosemide  #4 intermittent wheezing.  Refill albuterol for as needed use   Meds ordered this encounter  Medications   furosemide (LASIX) 40 MG tablet    Sig: Take 3 tablets (120 mg total) by mouth daily.    Dispense:  270 tablet    Refill:  3   albuterol (VENTOLIN HFA)  108 (90 Base) MCG/ACT inhaler    Sig: Inhale 2 puffs into the lungs every 4 (four) hours as needed for wheezing or shortness of breath.    Dispense:  1 each    Refill:  1    Follow-up: Return in about 4 months (around 05/18/2021).    Carolann Littler, MD

## 2021-01-18 NOTE — Addendum Note (Signed)
Addended by: [name] on: 01/18/2021 02:09 PM   Modules accepted: Orders

## 2021-01-19 DIAGNOSIS — Z992 Dependence on renal dialysis: Secondary | ICD-10-CM | POA: Diagnosis not present

## 2021-01-19 DIAGNOSIS — D631 Anemia in chronic kidney disease: Secondary | ICD-10-CM | POA: Diagnosis not present

## 2021-01-19 DIAGNOSIS — N186 End stage renal disease: Secondary | ICD-10-CM | POA: Diagnosis not present

## 2021-01-19 DIAGNOSIS — D509 Iron deficiency anemia, unspecified: Secondary | ICD-10-CM | POA: Diagnosis not present

## 2021-01-19 DIAGNOSIS — N2581 Secondary hyperparathyroidism of renal origin: Secondary | ICD-10-CM | POA: Diagnosis not present

## 2021-01-21 DIAGNOSIS — D509 Iron deficiency anemia, unspecified: Secondary | ICD-10-CM | POA: Diagnosis not present

## 2021-01-21 DIAGNOSIS — Z992 Dependence on renal dialysis: Secondary | ICD-10-CM | POA: Diagnosis not present

## 2021-01-21 DIAGNOSIS — N2581 Secondary hyperparathyroidism of renal origin: Secondary | ICD-10-CM | POA: Diagnosis not present

## 2021-01-21 DIAGNOSIS — N186 End stage renal disease: Secondary | ICD-10-CM | POA: Diagnosis not present

## 2021-01-21 DIAGNOSIS — D631 Anemia in chronic kidney disease: Secondary | ICD-10-CM | POA: Diagnosis not present

## 2021-01-22 ENCOUNTER — Ambulatory Visit (INDEPENDENT_AMBULATORY_CARE_PROVIDER_SITE_OTHER): Payer: Medicare Other

## 2021-01-22 VITALS — Ht 70.0 in | Wt 196.0 lb

## 2021-01-22 DIAGNOSIS — Z0000 Encounter for general adult medical examination without abnormal findings: Secondary | ICD-10-CM | POA: Diagnosis not present

## 2021-01-22 NOTE — Progress Notes (Signed)
Subjective:   Anthony Bullock is a 61 y.o. male who presents for Medicare Annual/Subsequent preventive examination.  Review of Systems    No ROS Cardiac Risk Factors include: advanced age (>72mn, >>41women);hypertension;diabetes mellitus    Objective:    Today's Vitals   01/22/21 0902  Weight: 196 lb (88.9 kg)  Height: _0  (1.778 m)   Body mass index is 28.12 kg/m.  Advanced Directives 01/22/2021 07/16/2020 04/29/2020 01/20/2020 11/18/2019 11/16/2019 11/13/2019  Does Patient Have a Medical Advance Directive? _1  No -  Type of Advance Directive - - - - HPress photographerLiving will - -  Does patient want to make changes to medical advance directive? - - - - Yes (Inpatient - patient requests chaplain consult to change a medical advance directive) - -  [place] of [place]? - - - - No - copy requested - -  Would patient like information on creating a medical advance directive? No - Patient declined No - Patient declined No - Patient declined No - Patient declined - No - Patient declined No - Patient declined  Pre-existing out of facility DNR order (yellow form or pink MOST form) - - - - - - -    Current Medications (verified) Outpatient Encounter Medications as of 01/22/2021  Medication Sig   acetaminophen (TYLENOL) 325 MG tablet Take 1-2 tablets (325-650 mg total) by mouth every 4 (four) hours as needed for mild pain.   albuterol (VENTOLIN HFA) 108 (90 Base) MCG/ACT inhaler Inhale 2 puffs into the lungs every 4 (four) hours as needed for wheezing or shortness of breath.   aspirin EC 81 MG tablet Take 81 mg by mouth daily.    atorvastatin (LIPITOR) 80 MG tablet Take 1 tablet (80 mg total) by mouth at bedtime.   azelastine (ASTELIN) 0.1 % nasal spray Place 2 sprays into both nostrils 3 (three) times daily as needed for rhinitis. Use in each nostril as directed   CALCITRIOL PO Take by mouth 3 (three) times a week. ONLY PROVIDED AT DIALYSIS    carvedilol (COREG) 12.5 MG tablet Take 12.5 mg by mouth 2 (two) times daily.   cinacalcet (SENSIPAR) 30 MG tablet Take 1 tablet (30 mg total) by mouth Every Tuesday,Thursday,and Saturday with dialysis.   Continuous Blood Gluc Sensor (FREESTYLE LIBRE 14 DAY SENSOR) MISC Use as directed   fenofibrate 160 MG tablet Take 1 tablet (160 mg total) by mouth daily.   fexofenadine (ALLEGRA) 180 MG tablet Take 180 mg by mouth daily.   furosemide (LASIX) 40 MG tablet Take 3 tablets (120 mg total) by mouth daily.   gabapentin (NEURONTIN) 300 MG capsule Take 2 capsules (600 mg total) by mouth 2 (two) times daily.   glucose blood test strip Check 1 time daily. E11.9 One Touch Ultra Blue Test Strips   HUMALOG KWIKPEN 200 UNIT/ML KwikPen USE TWICE DAILY WITH LUNCH AND SUPPER PER SLIDING SCALE (MAXIMUN DAILY DOSE OF 28 UNIT) - APPOINTMENT REQUIRED FOR FURTHER REFILLS   insulin glargine (LANTUS SOLOSTAR) 100 UNIT/ML Solostar Pen Inject 12 units subcutaneously at bed time. (Patient taking differently: 16 Units. Inject 16 units subcutaneously at bed time.)   Insulin Pen Needle (BD PEN NEEDLE NANO U/F) 32G X 4 MM MISC USE 1 PEN NEEDLE SUBCUTANEOUSLY WITH INSULIN 4 TIMES DAILY   lanthanum (FOSRENOL) 500 MG chewable tablet Chew 500-1,000 mg by mouth See admin instructions. Take 1000 mg with meals three time a day and  500 mg with snacks   midodrine (PROAMATINE) 10 MG tablet Take 10 mg by mouth 3 (three) times a week.   multivitamin (RENA-VIT) TABS tablet Take 1 tablet by mouth daily.   Olopatadine HCl 0.2 % SOLN Place 1 drop into both eyes daily as needed (for allergies).    ondansetron (ZOFRAN ODT) 8 MG disintegrating tablet Take 1 tablet (8 mg total) by mouth every 8 (eight) hours as needed for nausea or vomiting.   No facility-administered encounter medications on file as of 01/22/2021.    Allergies (verified) Epoetin alfa, Ferumoxytol, and Morphine sulfate   History: Past Medical History:  Diagnosis Date   AICD  (automatic cardioverter/defibrillator) present    [employer]   Allergic rhinitis    Anemia    Arthritis    Chronic systolic heart failure ([place])    a. ECHO (12/2012) EF 25-30%, HK entireanteroseptal myocardium //  b.  EF 25%, diffuse HK, grade 1 diastolic dysfunction, MAC, mild LAE, normal RVSF, trivial pericardial effusion   COPD (chronic obstructive pulmonary disease) ([place])    Diabetes mellitus type II    Diabetic nephropathy ([place])    Diabetic neuropathy ([place])    ESRD on hemodialysis ([place])    started HD June 2017, goes to [facility] HD unit, Dr Hinda Lenis   History of cardiac catheterization    a.Myoview 1/15:  There is significant left ventricular dysfunction. There may be slight scar at the apex. There is no significant ischemia. LV Ejection Fraction: 27%  //  b. RHC/LHC (1/15) with mean RA 6, PA 47/22 mean 33, mean PCWP 20, PVR 2.5 WU, CI 2.5; 80% dLAD stenosis, 70% diffuse large D.     History of kidney stones    Hyperlipidemia    Hypertension    Kidney stones    NICM (nonischemic cardiomyopathy) ([place])    Primarily nonischemic.  Echo (12/14) with EF 25-30%.  Echo (3/15) with EF 25%, mild to moderately dilated LV, normal RV size and systolic function.     Osteomyelitis ([place])    left fifth ray   Pneumonia    Urethral stricture    Wears glasses    Past Surgical History:  Procedure Laterality Date   ABDOMINAL AORTOGRAM W/LOWER EXTREMITY N/A 03/30/2016   Procedure: Abdominal Aortogram w/Lower Extremity;  Surgeon: Angelia Mould, MD;  Location: [place] [facility];  Service: Cardiovascular;  Laterality: N/A;   AMPUTATION Right 04/26/2016   Procedure: Right Below Knee Amputation;  Surgeon: Newt Minion, MD;  Location: [place];  Service: Orthopedics;  Laterality: Right;   AMPUTATION Left 08/21/2019   Procedure: LEFT FOOT 5TH RAY AMPUTATION;  Surgeon: Newt Minion, MD;  Location: [place];  Service: Orthopedics;  Laterality: Left;   AMPUTATION Left 11/13/2019   Procedure: LEFT  BELOW KNEE AMPUTATION;  Surgeon: Newt Minion, MD;  Location: [place];  Service: Orthopedics;  Laterality: Left;   AV FISTULA PLACEMENT Right 09/08/2015   Procedure: INSERTION OF 4-53m x 45cm  ARTERIOVENOUS (AV) GORE-TEX GRAFT RIGHT UPPER  ARM;  Surgeon: CAngelia Mould MD;  [place]  Service: Vascular;  Laterality: Right;   AV FISTULA PLACEMENT Left 01/14/2016   Procedure: CREATION OF LEFT UPPER ARM ARTERIOVENOUS FISTULA;  Surgeon: CAngelia Mould MD;  [place]  Service: Vascular;  Laterality: Left;   BH[place]Right 08/22/2014   Procedure: RIGHT UPPER ARM BBiloxi  Surgeon: CAngelia Mould MD;  [place]  Service: Vascular;  Laterality:  Right;   BELOW KNEE LEG AMPUTATION Right 04/26/2016   CARDIAC CATHETERIZATION     CARDIAC DEFIBRILLATOR PLACEMENT  06/27/2013   Sub Q       BY DR Caryl Comes   CATARACT EXTRACTION W/PHACO Right 08/06/2018   Procedure: CATARACT EXTRACTION PHACO AND INTRAOCULAR LENS PLACEMENT ([place]);  Surgeon: Baruch Goldmann, MD;  [facility];  Service: Ophthalmology;  Laterality: Right;  CDE: 4.06   CATARACT EXTRACTION W/PHACO Left 08/20/2018   Procedure: CATARACT EXTRACTION PHACO AND INTRAOCULAR LENS PLACEMENT (IOC);  Surgeon: Baruch Goldmann, MD;  Location: [facility];  Service: Ophthalmology;  Laterality: Left;  CDE: 6.76   COLONOSCOPY WITH PROPOFOL N/A 07/22/2015   Procedure: COLONOSCOPY WITH PROPOFOL;  Surgeon: Doran Stabler, MD;  [facility];  Service: Gastroenterology;  Laterality: N/A;   FEMORAL-POPLITEAL BYPASS GRAFT Right 03/31/2016   Procedure: BYPASS GRAFT FEMORAL-POPLITEAL ARTERY USING RIGHT GREATER SAPHENOUS NONREVERSED VEIN;  Surgeon: Angelia Mould, MD;  Location: [place];  Service: Vascular;  Laterality: Right;   HERNIA REPAIR     I & D EXTREMITY Right 03/31/2016   Procedure: IRRIGATION AND DEBRIDEMENT FOOT;  Surgeon: Angelia Mould, MD;  Location: [place];  Service:  Vascular;  Laterality: Right;   IMPLANTABLE CARDIOVERTER DEFIBRILLATOR IMPLANT N/A 06/27/2013   Procedure: SUB Q ICD;  Surgeon: Deboraha Sprang, MD;  Location: [facility];  Service: Cardiovascular;  Laterality: N/A;   INTRAOPERATIVE ARTERIOGRAM Right 03/31/2016   Procedure: INTRA OPERATIVE ARTERIOGRAM;  Surgeon: Angelia Mould, MD;  Location: [place];  Service: Vascular;  Laterality: Right;   IR GENERIC HISTORICAL Right 11/30/2015   IR THROMBECTOMY AV FISTULA W/THROMBOLYSIS/PTA INC/SHUNT/IMG RIGHT 11/30/2015 Aletta Edouard, MD [facility]   IR GENERIC HISTORICAL  11/30/2015   IR US GUIDE VASC ACCESS RIGHT 11/30/2015 Aletta Edouard, MD [facility]   IR GENERIC HISTORICAL Right 12/15/2015   IR THROMBECTOMY AV FISTULA W/THROMBOLYSIS/PTA/STENT INC/SHUNT/IMG RT 12/15/2015 Arne Cleveland, MD [facility]   IR GENERIC HISTORICAL  12/15/2015   IR US GUIDE VASC ACCESS RIGHT 12/15/2015 Arne Cleveland, MD [facility]   IR GENERIC HISTORICAL  12/28/2015   IR FLUORO GUIDE CV LINE RIGHT 12/28/2015 Marybelle Killings, MD [facility]   IR GENERIC HISTORICAL  12/28/2015   IR US GUIDE VASC ACCESS RIGHT 12/28/2015 Marybelle Killings, MD [facility]   LEFT A ND RIGHT HEART CATH  01/30/2013   DR Sung Amabile   LEFT AND RIGHT HEART CATHETERIZATION WITH CORONARY ANGIOGRAM N/A 01/30/2013   Procedure: LEFT AND RIGHT HEART CATHETERIZATION WITH CORONARY ANGIOGRAM;  Surgeon: Jolaine Artist, MD;  [facility];  Service: Cardiovascular;  Laterality: N/A;   PERIPHERAL VASCULAR CATHETERIZATION Right 01/26/2015   Procedure: A/V Fistulagram;  Surgeon: Angelia Mould, MD;  Location: [place] [facility];  Service: Cardiovascular;  Laterality: Right;   reapea urethral surgery for recurrent obstruction  2011   TOTAL KNEE ARTHROPLASTY Right 2007   VEIN HARVEST Right 03/31/2016   Procedure: RIGHT GREATER SAPHENOUS VEIN HARVEST;  Surgeon: Angelia Mould, MD;  Location: [place];  Service: Vascular;  Laterality: Right;    Family History  Problem Relation Age of Onset   Bladder Cancer Mother    Alcohol abuse Father    Melanoma Father    Stroke Maternal Grandmother    Heart Problems Maternal Grandmother        unknown   Diabetes Maternal Grandmother    Heart disease Maternal Grandfather    Prostate cancer Maternal Grandfather    Social  History   Socioeconomic History   Marital status: Married    Spouse name: Not on file   Number of children: 0   Years of education: Not on file   Highest education level: Not on file  Occupational History   Not on file  Tobacco Use   Smoking status: Former    Packs/day: 2.00    Years: 32.00    Pack years: 64.00    Types: Cigarettes    Quit date: 05/11/2010    Years since quitting: 10.7   Smokeless tobacco: Never  Vaping Use   Vaping Use: Never used  Substance and Sexual Activity   Alcohol use: No   Drug use: No   Sexual activity: Yes  Other Topics Concern   Not on file  Social History Narrative   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   Social Determinants of Radio broadcast assistant Strain: Low Risk    Difficulty of Paying Living Expenses: Not hard at all  Food Insecurity: No Food Insecurity   Worried About Charity fundraiser in the Last Year: Never true   Arboriculturist in the Last Year: Never true  Transportation Needs: No Transportation Needs   Lack of Transportation (Medical): No   Lack of Transportation (Non-Medical): No  Physical Activity: Inactive   Days of Exercise per Week: 0 days   Minutes of Exercise per Session: 0 min  Stress: No Stress Concern Present   Feeling of Stress : Not at all  Social Connections: Socially Integrated   Frequency of Communication with Friends and Family: More than three times a week   Frequency of Social Gatherings with Friends and Family: More than three times a week   Attends Religious Services: More than 4 times per year   Active Member of [employer] or Organizations: Yes   Attends Arts administrator: More than 4 times per year   Marital Status: Married    Tobacco Counseling Counseling given: Not Answered   Clinical Intake: Nutrition Risk Assessment:  Has the patient had any N/V/D within the last 2 months?  No  Does the patient have any non-healing wounds?  No  Has the patient had any unintentional weight loss or weight gain?  No   Diabetes:  Is the patient diabetic?  Yes  If diabetic, was a CBG obtained today?  Yes  CBG 90 taken by patient. Did the patient bring in their glucometer from home?  No  Audio Visit How often do you monitor your CBG's? Daily.   Financial Strains and Diabetes Management:  Are you having any financial strains with the device, your supplies or your medication? No .  Does the patient want to be seen by Chronic Care Management for management of their diabetes?  No  Would the patient like to be referred to a Nutritionist or for Diabetic Management?  No  Followed by PCP  Diabetic Exams:  Diabetic Eye Exam: Completed Yes. Followed by [facility]  Pre-visit preparation completed: Yes Diabetic? Yes  Interpreter Needed?: No Activities of Daily Living In your present state of health, do you have any difficulty performing the following activities: 01/22/2021  Hearing? N  Vision? N  Difficulty concentrating or making decisions? N  Walking or climbing stairs? N  Dressing or bathing? N  Doing errands, shopping? N  Preparing Food and eating ? N  Using the Toilet? N  In the past six months, have you accidently leaked urine? N  Comment Patient incontient qhs on occasions  Do you have problems with loss of bowel control? Y  Managing your Medications? Y  Comment Patient incontient qhs on occasions  Managing your Finances? N  Housekeeping or managing your Housekeeping? N  Comment Wife Assist  Some recent data might be hidden    Patient Care Team: [name], MD as PCP - General (Family Medicine) Harl Bowie Alphonse Guild, MD as PCP -  Cardiology (Cardiology) Deboraha Sprang, MD as PCP - Electrophysiology (Cardiology) Deboraha Sprang, MD as Consulting Physician (Cardiology) Fran Lowes, MD (Inactive) as Consulting Physician (Nephrology) Harl Bowie Alphonse Guild, MD as Consulting Physician (Cardiology) [place], [facility], Revonda Standard, MD as Consulting Physician (Cardiology)  Indicate any recent Medical Services you may have received from other than [facility] providers in the past year (date may be approximate).     Assessment:   This is a routine wellness examination for Rossie.  Hearing/Vision screen Hearing Screening - Comments:: No difficulty hearing Vision Screening - Comments:: Wears glasses. Followed by [facility]  Dietary issues and exercise activities discussed: Current Exercise Habits: The patient does not participate in regular exercise at present   Goals Addressed             This Visit's Progress    Patient Stated       I would like to get back on my feet again once I get my prothesis so I can be independent.       Depression Screen PHQ 2/9 Scores 01/22/2021 01/18/2021 07/16/2020 01/20/2020 01/01/2020 05/08/2019 11/15/2017  PHQ - 2 Score 0 0 0 0 0 0 0  PHQ- 9 Score - - - 0 0 - -    Fall Risk Fall Risk  01/22/2021 01/18/2021 07/16/2020 01/20/2020 01/01/2020  Falls in the past year? 1 1 0 0 0  Number falls in past yr: 0 1 0 0 -  Comment Patient fell due to prosthess - - - -  Injury with Fall? 0 0 0 0 -  Risk for fall due to : - - Impaired mobility Impaired mobility;Medication side effect -  Follow up - - Education provided;Falls prevention discussed Falls evaluation completed;Falls prevention discussed -    FALL RISK PREVENTION PERTAINING TO THE HOME:  Any stairs in or around the home? Yes  If so, are there any without handrails? No  Home free of loose throw rugs in walkways, pet beds, electrical cords, etc? Yes  Adequate lighting in your home to reduce risk of falls? Yes    ASSISTIVE DEVICES UTILIZED TO PREVENT FALLS:  Life alert? No  Use of a cane, walker or w/c? Yes  Grab bars in the bathroom? No  Shower chair or bench in shower? No  Elevated toilet seat or a handicapped toilet? Yes   TIMED UP AND GO:  Was the test performed? No . Audio Visit  Cognitive Function:   6CIT Screen 01/22/2021  What Year? 0 points  What month? 0 points  What time? 0 points  Count back from 20 0 points  Months in reverse 0 points  Repeat phrase 0 points  Total Score 0    Immunizations Immunization History  Administered Date(s) Administered   Fluad Quad(high Dose 65+) 10/16/2019   Hepatitis B, adult 07/27/2015, 08/28/2015, 09/30/2015, 02/01/2016, 03/21/2016   Influenza Split 11/10/2010, 11/10/2011, 10/03/2012   Influenza Whole 01/13/2010   Influenza, High Dose Seasonal PF 10/10/2020   Influenza,inj,Quad PF,6+ Mos 09/29/2014, 10/10/2017   Influenza-Unspecified 09/10/2013,  09/20/2015, 10/25/2016, 10/31/2017, 10/11/2018, 10/16/2019   Moderna Sars-Covid-2 Vaccination 04/18/2019   PPD Test 04/29/2015, 07/07/2015, 07/07/2015, 07/21/2015, 07/21/2015, 02/01/2016, 02/01/2016, 02/09/2017, 02/09/2017, 02/15/2018, 02/15/2018, 06/20/2019, 06/20/2019   Pneumococcal Conjugate-13 09/23/2015   Pneumococcal Polysaccharide-23 09/23/2015   Pneumococcal-Unspecified 09/23/2015   Td 10/03/2008    TDAP status: Due, Education has been provided regarding the importance of this vaccine. Advised may receive this vaccine at local pharmacy or Health Dept. Aware to provide a copy of the vaccination record if obtained from local pharmacy or Health Dept. Verbalized acceptance and understanding.  Flu Vaccine status: Up to date  Pneumococcal vaccine status: Due, Education has been provided regarding the importance of this vaccine. Advised may receive this vaccine at local pharmacy or Health Dept. Aware to provide a copy of the vaccination record if obtained from local pharmacy or Health Dept.  Verbalized acceptance and understanding.  Covid-19 vaccine status: Completed vaccines  Qualifies for Shingles Vaccine? Yes   Zostavax completed No   Shingrix Completed?: No.    Education has been provided regarding the importance of this vaccine. Patient has been advised to call insurance company to determine out of pocket expense if they have not yet received this vaccine. Advised may also receive vaccine at local pharmacy or Health Dept. Verbalized acceptance and understanding.  Screening Tests Health Maintenance  Topic Date Due   COVID-19 Vaccine (2 - Moderna risk series) 05/16/2019   FOOT EXAM  03/19/2020   Zoster Vaccines- Shingrix (1 of 2) 04/22/2021 (Originally 09/19/1979)   Pneumococcal Vaccine 74-31 Years old (3 - PPSV23 if available, else PCV20) 01/22/2022 (Originally 09/22/2020)   TETANUS/TDAP  01/22/2022 (Originally 10/04/2018)   URINE MICROALBUMIN  12/07/2025 (Originally 02/18/2010)   OPHTHALMOLOGY EXAM  07/11/2021   HEMOGLOBIN A1C  07/18/2021   COLONOSCOPY (Pts 45-28yr Insurance coverage will need to be confirmed)  07/21/2025   INFLUENZA VACCINE  Completed   Hepatitis C Screening  Completed   HIV Screening  Completed   HPV VACCINES  Aged Out    Health Maintenance  Health Maintenance Due  Topic Date Due   COVID-19 Vaccine (2 - Moderna risk series) 05/16/2019   FOOT EXAM  03/19/2020       Additional Screening:   Vision Screening: Recommended annual ophthalmology exams for early detection of glaucoma and other disorders of the eye. Is the patient up to date with their annual eye exam?  Yes  Who is the provider or what is the name of the office in which the patient attends annual eye exams? [facility]: Recommended annual dental exams for proper oral hygiene  Community Resource Referral / Chronic Care Management:  CRR required this visit?  No   CCM required this visit?  No      Plan:     I have personally reviewed and  noted the following in the patients chart:   Medical and social history Use of alcohol, tobacco or illicit drugs  Current medications and supplements including opioid prescriptions. Patient is not currently taking opioid prescriptions. Functional ability and status Nutritional status Physical activity Advanced directives List of other physicians Hospitalizations, surgeries, and ER visits in previous 12 months Vitals Screenings to include cognitive, depression, and falls Referrals and appointments  In addition, I have reviewed and discussed with patient certain preventive protocols, quality metrics, and best practice recommendations. A written personalized care plan for preventive services as well as general preventive health recommendations were provided to patient.     BCriselda Peaches  LPN   [id number]

## 2021-01-22 NOTE — Patient Instructions (Addendum)
Anthony Bullock , Thank you for taking time to come for your Medicare Wellness Visit. I appreciate your ongoing commitment to your health goals. Please review the following plan we discussed and let me know if I can assist you in the future.   These are the goals we discussed:  Goals      Patient Stated     I would like to get back on my feet again once I get my prothesis so I can be independent.        This is a list of the screening recommended for you and due dates:  Health Maintenance  Topic Date Due   COVID-19 Vaccine (2 - Moderna risk series) 05/16/2019   Complete foot exam   03/19/2020   Zoster (Shingles) Vaccine (1 of 2) 04/22/2021*   Pneumococcal Vaccination (3 - PPSV23 if available, else PCV20) 01/22/2022*   Tetanus Vaccine  01/22/2022*   Urine Protein Check  12/07/2025*   Eye exam for diabetics  07/11/2021   Hemoglobin A1C  07/18/2021   Colon Cancer Screening  07/21/2025   Flu Shot  Completed   Hepatitis C Screening: USPSTF Recommendation to screen - Ages 18-79 yo.  Completed   HIV Screening  Completed   HPV Vaccine  Aged Out  *Topic was postponed. The date shown is not the original due date.   Advanced directives: No  Conditions/risks identified: None  Next appointment: Follow up in one year for your annual wellness visit.   Preventive Care 60 Years and Older, Male Preventive care refers to lifestyle choices and visits with your health care provider that can promote health and wellness. What does preventive care include? A yearly physical exam. This is also called an annual well check. Dental exams once or twice a year. Routine eye exams. Ask your health care provider how often you should have your eyes checked. Personal lifestyle choices, including: Daily care of your teeth and gums. Regular physical activity. Eating a healthy diet. Avoiding tobacco and drug use. Limiting alcohol use. Practicing safe sex. Taking low doses of aspirin every day. Taking  vitamin and mineral supplements as recommended by your health care provider. What happens during an annual well check? The services and screenings done by your health care provider during your annual well check will depend on your age, overall health, lifestyle risk factors, and family history of disease. Counseling  Your health care provider may ask you questions about your: Alcohol use. Tobacco use. Drug use. Emotional well-being. Home and relationship well-being. Sexual activity. Eating habits. History of falls. Memory and ability to understand (cognition). Work and work Statistician. Screening  You may have the following tests or measurements: Height, weight, and BMI. Blood pressure. Lipid and cholesterol levels. These may be checked every 5 years, or more frequently if you are over 46 years old. Skin check. Lung cancer screening. You may have this screening every year starting at age 18 if you have a 30-pack-year history of smoking and currently smoke or have quit within the past 15 years. Fecal occult blood test (FOBT) of the stool. You may have this test every year starting at age 80. Flexible sigmoidoscopy or colonoscopy. You may have a sigmoidoscopy every 5 years or a colonoscopy every 10 years starting at age 43. Prostate cancer screening. Recommendations will vary depending on your family history and other risks. Hepatitis C blood test. Hepatitis B blood test. Sexually transmitted disease (STD) testing. Diabetes screening. This is done by checking your blood sugar (glucose) after  you have not eaten for a while (fasting). You may have this done every 1-3 years. Abdominal aortic aneurysm (AAA) screening. You may need this if you are a current or former smoker. Osteoporosis. You may be screened starting at age 85 if you are at high risk. Talk with your health care provider about your test results, treatment options, and if necessary, the need for more tests. Vaccines  Your  health care provider may recommend certain vaccines, such as: Influenza vaccine. This is recommended every year. Tetanus, diphtheria, and acellular pertussis (Tdap, Td) vaccine. You may need a Td booster every 10 years. Zoster vaccine. You may need this after age 14. Pneumococcal 13-valent conjugate (PCV13) vaccine. One dose is recommended after age 107. Pneumococcal polysaccharide (PPSV23) vaccine. One dose is recommended after age 22. Talk to your health care provider about which screenings and vaccines you need and how often you need them. This information is not intended to replace advice given to you by your health care provider. Make sure you discuss any questions you have with your health care provider. Document Released: 01/23/2015 Document Revised: 09/16/2015 Document Reviewed: 10/28/2014 Elsevier Interactive Patient Education  2017 [place] Prevention in the Home Falls can cause injuries. They can happen to people of all ages. There are many things you can do to make your home safe and to help prevent falls. What can I do on the outside of my home? Regularly fix the edges of walkways and driveways and fix any cracks. Remove anything that might make you trip as you walk through a door, such as a raised step or threshold. Trim any bushes or trees on the path to your home. Use bright outdoor lighting. Clear any walking paths of anything that might make someone trip, such as rocks or tools. Regularly check to see if handrails are loose or broken. Make sure that both sides of any steps have handrails. Any raised decks and porches should have guardrails on the edges. Have any leaves, snow, or ice cleared regularly. Use sand or salt on walking paths during winter. Clean up any spills in your garage right away. This includes oil or grease spills. What can I do in the bathroom? Use night lights. Install grab bars by the toilet and in the tub and shower. Do not use towel bars as  grab bars. Use non-skid mats or decals in the tub or shower. If you need to sit down in the shower, use a plastic, non-slip stool. Keep the floor dry. Clean up any water that spills on the floor as soon as it happens. Remove soap buildup in the tub or shower regularly. Attach bath mats securely with double-sided non-slip rug tape. Do not have throw rugs and other things on the floor that can make you trip. What can I do in the bedroom? Use night lights. Make sure that you have a light by your bed that is easy to reach. Do not use any sheets or blankets that are too big for your bed. They should not hang down onto the floor. Have a firm chair that has side arms. You can use this for support while you get dressed. Do not have throw rugs and other things on the floor that can make you trip. What can I do in the kitchen? Clean up any spills right away. Avoid walking on wet floors. Keep items that you use a lot in easy-to-reach places. If you need to reach something above you, use a strong  step stool that has a grab bar. Keep electrical cords out of the way. Do not use floor polish or wax that makes floors slippery. If you must use wax, use non-skid floor wax. Do not have throw rugs and other things on the floor that can make you trip. What can I do with my stairs? Do not leave any items on the stairs. Make sure that there are handrails on both sides of the stairs and use them. Fix handrails that are broken or loose. Make sure that handrails are as long as the stairways. Check any carpeting to make sure that it is firmly attached to the stairs. Fix any carpet that is loose or worn. Avoid having throw rugs at the top or bottom of the stairs. If you do have throw rugs, attach them to the floor with carpet tape. Make sure that you have a light switch at the top of the stairs and the bottom of the stairs. If you do not have them, ask someone to add them for you. What else can I do to help prevent  falls? Wear shoes that: Do not have high heels. Have rubber bottoms. Are comfortable and fit you well. Are closed at the toe. Do not wear sandals. If you use a stepladder: Make sure that it is fully opened. Do not climb a closed stepladder. Make sure that both sides of the stepladder are locked into place. Ask someone to hold it for you, if possible. Clearly mark and make sure that you can see: Any grab bars or handrails. First and last steps. Where the edge of each step is. Use tools that help you move around (mobility aids) if they are needed. These include: Canes. Walkers. Scooters. Crutches. Turn on the lights when you go into a dark area. Replace any light bulbs as soon as they burn out. Set up your furniture so you have a clear path. Avoid moving your furniture around. If any of your floors are uneven, fix them. If there are any pets around you, be aware of where they are. Review your medicines with your doctor. Some medicines can make you feel dizzy. This can increase your chance of falling. Ask your doctor what other things that you can do to help prevent falls. This information is not intended to replace advice given to you by your health care provider. Make sure you discuss any questions you have with your health care provider. Document Released: 10/23/2008 Document Revised: 06/04/2015 Document Reviewed: 01/31/2014 Elsevier Interactive Patient Education  2017 [employer].

## 2021-01-23 DIAGNOSIS — D509 Iron deficiency anemia, unspecified: Secondary | ICD-10-CM | POA: Diagnosis not present

## 2021-01-23 DIAGNOSIS — N186 End stage renal disease: Secondary | ICD-10-CM | POA: Diagnosis not present

## 2021-01-23 DIAGNOSIS — N2581 Secondary hyperparathyroidism of renal origin: Secondary | ICD-10-CM | POA: Diagnosis not present

## 2021-01-23 DIAGNOSIS — D631 Anemia in chronic kidney disease: Secondary | ICD-10-CM | POA: Diagnosis not present

## 2021-01-23 DIAGNOSIS — Z992 Dependence on renal dialysis: Secondary | ICD-10-CM | POA: Diagnosis not present

## 2021-01-26 DIAGNOSIS — N2581 Secondary hyperparathyroidism of renal origin: Secondary | ICD-10-CM | POA: Diagnosis not present

## 2021-01-26 DIAGNOSIS — Z992 Dependence on renal dialysis: Secondary | ICD-10-CM | POA: Diagnosis not present

## 2021-01-26 DIAGNOSIS — D509 Iron deficiency anemia, unspecified: Secondary | ICD-10-CM | POA: Diagnosis not present

## 2021-01-26 DIAGNOSIS — D631 Anemia in chronic kidney disease: Secondary | ICD-10-CM | POA: Diagnosis not present

## 2021-01-26 DIAGNOSIS — N186 End stage renal disease: Secondary | ICD-10-CM | POA: Diagnosis not present

## 2021-01-28 DIAGNOSIS — N186 End stage renal disease: Secondary | ICD-10-CM | POA: Diagnosis not present

## 2021-01-28 DIAGNOSIS — Z992 Dependence on renal dialysis: Secondary | ICD-10-CM | POA: Diagnosis not present

## 2021-01-28 DIAGNOSIS — D631 Anemia in chronic kidney disease: Secondary | ICD-10-CM | POA: Diagnosis not present

## 2021-01-28 DIAGNOSIS — N2581 Secondary hyperparathyroidism of renal origin: Secondary | ICD-10-CM | POA: Diagnosis not present

## 2021-01-28 DIAGNOSIS — D509 Iron deficiency anemia, unspecified: Secondary | ICD-10-CM | POA: Diagnosis not present

## 2021-01-30 DIAGNOSIS — D631 Anemia in chronic kidney disease: Secondary | ICD-10-CM | POA: Diagnosis not present

## 2021-01-30 DIAGNOSIS — D509 Iron deficiency anemia, unspecified: Secondary | ICD-10-CM | POA: Diagnosis not present

## 2021-01-30 DIAGNOSIS — N186 End stage renal disease: Secondary | ICD-10-CM | POA: Diagnosis not present

## 2021-01-30 DIAGNOSIS — N2581 Secondary hyperparathyroidism of renal origin: Secondary | ICD-10-CM | POA: Diagnosis not present

## 2021-01-30 DIAGNOSIS — Z992 Dependence on renal dialysis: Secondary | ICD-10-CM | POA: Diagnosis not present

## 2021-02-02 DIAGNOSIS — D509 Iron deficiency anemia, unspecified: Secondary | ICD-10-CM | POA: Diagnosis not present

## 2021-02-02 DIAGNOSIS — N186 End stage renal disease: Secondary | ICD-10-CM | POA: Diagnosis not present

## 2021-02-02 DIAGNOSIS — D631 Anemia in chronic kidney disease: Secondary | ICD-10-CM | POA: Diagnosis not present

## 2021-02-02 DIAGNOSIS — N2581 Secondary hyperparathyroidism of renal origin: Secondary | ICD-10-CM | POA: Diagnosis not present

## 2021-02-02 DIAGNOSIS — Z992 Dependence on renal dialysis: Secondary | ICD-10-CM | POA: Diagnosis not present

## 2021-02-04 DIAGNOSIS — Z992 Dependence on renal dialysis: Secondary | ICD-10-CM | POA: Diagnosis not present

## 2021-02-04 DIAGNOSIS — D631 Anemia in chronic kidney disease: Secondary | ICD-10-CM | POA: Diagnosis not present

## 2021-02-04 DIAGNOSIS — N186 End stage renal disease: Secondary | ICD-10-CM | POA: Diagnosis not present

## 2021-02-04 DIAGNOSIS — D509 Iron deficiency anemia, unspecified: Secondary | ICD-10-CM | POA: Diagnosis not present

## 2021-02-04 DIAGNOSIS — N2581 Secondary hyperparathyroidism of renal origin: Secondary | ICD-10-CM | POA: Diagnosis not present

## 2021-02-06 DIAGNOSIS — Z992 Dependence on renal dialysis: Secondary | ICD-10-CM | POA: Diagnosis not present

## 2021-02-06 DIAGNOSIS — D631 Anemia in chronic kidney disease: Secondary | ICD-10-CM | POA: Diagnosis not present

## 2021-02-06 DIAGNOSIS — N186 End stage renal disease: Secondary | ICD-10-CM | POA: Diagnosis not present

## 2021-02-06 DIAGNOSIS — N2581 Secondary hyperparathyroidism of renal origin: Secondary | ICD-10-CM | POA: Diagnosis not present

## 2021-02-06 DIAGNOSIS — D509 Iron deficiency anemia, unspecified: Secondary | ICD-10-CM | POA: Diagnosis not present

## 2021-02-09 DIAGNOSIS — D509 Iron deficiency anemia, unspecified: Secondary | ICD-10-CM | POA: Diagnosis not present

## 2021-02-09 DIAGNOSIS — N186 End stage renal disease: Secondary | ICD-10-CM | POA: Diagnosis not present

## 2021-02-09 DIAGNOSIS — D631 Anemia in chronic kidney disease: Secondary | ICD-10-CM | POA: Diagnosis not present

## 2021-02-09 DIAGNOSIS — Z992 Dependence on renal dialysis: Secondary | ICD-10-CM | POA: Diagnosis not present

## 2021-02-09 DIAGNOSIS — N2581 Secondary hyperparathyroidism of renal origin: Secondary | ICD-10-CM | POA: Diagnosis not present

## 2021-02-11 DIAGNOSIS — Z23 Encounter for immunization: Secondary | ICD-10-CM | POA: Diagnosis not present

## 2021-02-11 DIAGNOSIS — N186 End stage renal disease: Secondary | ICD-10-CM | POA: Diagnosis not present

## 2021-02-11 DIAGNOSIS — D509 Iron deficiency anemia, unspecified: Secondary | ICD-10-CM | POA: Diagnosis not present

## 2021-02-11 DIAGNOSIS — D631 Anemia in chronic kidney disease: Secondary | ICD-10-CM | POA: Diagnosis not present

## 2021-02-11 DIAGNOSIS — N2581 Secondary hyperparathyroidism of renal origin: Secondary | ICD-10-CM | POA: Diagnosis not present

## 2021-02-11 DIAGNOSIS — Z992 Dependence on renal dialysis: Secondary | ICD-10-CM | POA: Diagnosis not present

## 2021-02-13 DIAGNOSIS — N2581 Secondary hyperparathyroidism of renal origin: Secondary | ICD-10-CM | POA: Diagnosis not present

## 2021-02-13 DIAGNOSIS — D631 Anemia in chronic kidney disease: Secondary | ICD-10-CM | POA: Diagnosis not present

## 2021-02-13 DIAGNOSIS — Z23 Encounter for immunization: Secondary | ICD-10-CM | POA: Diagnosis not present

## 2021-02-13 DIAGNOSIS — N186 End stage renal disease: Secondary | ICD-10-CM | POA: Diagnosis not present

## 2021-02-13 DIAGNOSIS — D509 Iron deficiency anemia, unspecified: Secondary | ICD-10-CM | POA: Diagnosis not present

## 2021-02-13 DIAGNOSIS — Z992 Dependence on renal dialysis: Secondary | ICD-10-CM | POA: Diagnosis not present

## 2021-02-16 DIAGNOSIS — N186 End stage renal disease: Secondary | ICD-10-CM | POA: Diagnosis not present

## 2021-02-16 DIAGNOSIS — Z23 Encounter for immunization: Secondary | ICD-10-CM | POA: Diagnosis not present

## 2021-02-16 DIAGNOSIS — Z992 Dependence on renal dialysis: Secondary | ICD-10-CM | POA: Diagnosis not present

## 2021-02-16 DIAGNOSIS — D631 Anemia in chronic kidney disease: Secondary | ICD-10-CM | POA: Diagnosis not present

## 2021-02-16 DIAGNOSIS — N2581 Secondary hyperparathyroidism of renal origin: Secondary | ICD-10-CM | POA: Diagnosis not present

## 2021-02-16 DIAGNOSIS — D509 Iron deficiency anemia, unspecified: Secondary | ICD-10-CM | POA: Diagnosis not present

## 2021-02-17 ENCOUNTER — Telehealth (INDEPENDENT_AMBULATORY_CARE_PROVIDER_SITE_OTHER): Payer: Medicare Other | Admitting: Family Medicine

## 2021-02-17 DIAGNOSIS — U071 COVID-19: Secondary | ICD-10-CM | POA: Diagnosis not present

## 2021-02-17 DIAGNOSIS — R110 Nausea: Secondary | ICD-10-CM | POA: Diagnosis not present

## 2021-02-17 MED ORDER — ONDANSETRON 8 MG PO TBDP: 8.0000 mg | ORAL_TABLET | Freq: Three times a day (TID) | ORAL | 0 refills | Status: DC | PRN

## 2021-02-17 MED ORDER — MOLNUPIRAVIR EUA 200MG CAPSULE: 4.0000 | ORAL_CAPSULE | Freq: Two times a day (BID) | ORAL | 0 refills | Status: AC

## 2021-02-17 NOTE — Progress Notes (Signed)
Patient ID: Anthony Bullock, male   DOB: [birth year], 61 y.o.   MRN: [id number]   This visit type was conducted due to national recommendations for restrictions regarding the COVID-19 pandemic in an effort to limit this patient's exposure and mitigate transmission in our community.   Virtual Visit via Telephone Note  I connected with Anthony Bullock on 02/17/21 at 10:30 AM EST by telephone and verified that I am speaking with the correct person using two identifiers.   I discussed the limitations, risks, security and privacy concerns of performing an evaluation and management service by telephone and the availability of in person appointments. I also discussed with the patient that there may be a patient responsible charge related to this service. The patient expressed understanding and agreed to proceed.  Location patient: home Location provider: work or home office Participants present for the call: patient, provider Patient did not have a visit in the prior 7 days to address this/these issue(s).   History of Present Illness:  Anthony Bullock has COVID infection.  His wife also tested positive yesterday.  His symptoms started yesterday.  He has some nasal congestion.  He had nausea yesterday with 2 episodes of vomiting but none today.  No diarrhea.  Does have some cough but no dyspnea.  He has multiple medical problems including end-stage renal disease on hemodialysis, peripheral vascular disease, history of congestive heart failure, type 2 diabetes.  They do have home pulse oximeter but have not been monitoring his oxygen levels.  He had hemodialysis yesterday.  Past Medical History:  Diagnosis Date   AICD (automatic cardioverter/defibrillator) present    [employer]   Allergic rhinitis    Anemia    Arthritis    Chronic systolic heart failure ([place])    a. ECHO (12/2012) EF 25-30%, HK entireanteroseptal myocardium //  b.  EF 25%, diffuse HK, grade 1 diastolic dysfunction, MAC, mild LAE, normal RVSF,  trivial pericardial effusion   COPD (chronic obstructive pulmonary disease) ([place])    Diabetes mellitus type II    Diabetic nephropathy ([place])    Diabetic neuropathy ([place])    ESRD on hemodialysis ([place])    started HD June 2017, goes to [facility], Dr Hinda Lenis   History of cardiac catheterization    a.Myoview 1/15:  There is significant left ventricular dysfunction. There may be slight scar at the apex. There is no significant ischemia. LV Ejection Fraction: 27%  //  b. RHC/LHC (1/15) with mean RA 6, PA 47/22 mean 33, mean PCWP 20, PVR 2.5 WU, CI 2.5; 80% dLAD stenosis, 70% diffuse large D.     History of kidney stones    Hyperlipidemia    Hypertension    Kidney stones    NICM (nonischemic cardiomyopathy) ([place])    Primarily nonischemic.  Echo (12/14) with EF 25-30%.  Echo (3/15) with EF 25%, mild to moderately dilated LV, normal RV size and systolic function.     Osteomyelitis ([place])    left fifth ray   Pneumonia    Urethral stricture    Wears glasses    Past Surgical History:  Procedure Laterality Date   ABDOMINAL AORTOGRAM W/LOWER EXTREMITY N/A 03/30/2016   Procedure: Abdominal Aortogram w/Lower Extremity;  Surgeon: Angelia Mould, MD;  Location: [place] [facility];  Service: Cardiovascular;  Laterality: N/A;   AMPUTATION Right 04/26/2016   Procedure: Right Below Knee Amputation;  Surgeon: Newt Minion, MD;  Location: [place];  Service: Orthopedics;  Laterality: Right;   AMPUTATION  Left 08/21/2019   Procedure: LEFT FOOT 5TH RAY AMPUTATION;  Surgeon: Newt Minion, MD;  Location: [place];  Service: Orthopedics;  Laterality: Left;   AMPUTATION Left 11/13/2019   Procedure: LEFT BELOW KNEE AMPUTATION;  Surgeon: Newt Minion, MD;  Location: [place];  Service: Orthopedics;  Laterality: Left;   AV FISTULA PLACEMENT Right 09/08/2015   Procedure: INSERTION OF 4-69m x 45cm  ARTERIOVENOUS (AV) GORE-TEX GRAFT RIGHT UPPER  ARM;  Surgeon: CAngelia Mould MD;  [place]  Service:  Vascular;  Laterality: Right;   AV FISTULA PLACEMENT Left 01/14/2016   Procedure: CREATION OF LEFT UPPER ARM ARTERIOVENOUS FISTULA;  Surgeon: CAngelia Mould MD;  [place]  Service: Vascular;  Laterality: Left;   BRoyal KuniaRight 08/22/2014   Procedure: RIGHT UPPER ARM [place]  Surgeon: CAngelia Mould MD;  [place]  Service: Vascular;  Laterality: Right;   BELOW KNEE LEG AMPUTATION Right 04/26/2016   CARDIAC CATHETERIZATION     CARDIAC DEFIBRILLATOR PLACEMENT  06/27/2013   Sub Q       BY DR KCaryl Comes  CATARACT EXTRACTION W/PHACO Right 08/06/2018   Procedure: CATARACT EXTRACTION PHACO AND INTRAOCULAR LENS PLACEMENT ([place];  Surgeon: WBaruch Goldmann MD;  [facility];  Service: Ophthalmology;  Laterality: Right;  CDE: 4.06   CATARACT EXTRACTION W/PHACO Left 08/20/2018   Procedure: CATARACT EXTRACTION PHACO AND INTRAOCULAR LENS PLACEMENT (I[facility]);  Surgeon: WBaruch Goldmann MD;  [facility];  Service: Ophthalmology;  Laterality: Left;  CDE: 6.76   COLONOSCOPY WITH PROPOFOL N/A 07/22/2015   Procedure: COLONOSCOPY WITH PROPOFOL;  Surgeon: HDoran Stabler MD;  [facility];  Service: Gastroenterology;  Laterality: N/A;   FEMORAL-POPLITEAL BYPASS GRAFT Right 03/31/2016   Procedure: BYPASS GRAFT FEMORAL-POPLITEAL ARTERY USING RIGHT GREATER SAPHENOUS NONREVERSED VEIN;  Surgeon: CAngelia Mould MD;  [place]  Service: Vascular;  Laterality: Right;   HERNIA REPAIR     I & D EXTREMITY Right 03/31/2016   Procedure: IRRIGATION AND DEBRIDEMENT FOOT;  Surgeon: CAngelia Mould MD;  [place] [facility]  Service: Vascular;  Laterality: Right;   IMPLANTABLE CARDIOVERTER DEFIBRILLATOR IMPLANT N/A 06/27/2013   Procedure: SUB Q ICD;  Surgeon: SDeboraha Sprang MD;  [facility];  Service: Cardiovascular;  Laterality: N/A;   INTRAOPERATIVE ARTERIOGRAM Right 03/31/2016   Procedure: INTRA OPERATIVE ARTERIOGRAM;  Surgeon:  CAngelia Mould MD;  [place]  Service: Vascular;  Laterality: Right;   IR GENERIC HISTORICAL Right 11/30/2015   IR THROMBECTOMY AV FISTULA W/THROMBOLYSIS/PTA INC/SHUNT/IMG RIGHT 11/30/2015 GAletta Edouard MD [facility]   IR GENERIC HISTORICAL  11/30/2015   IR [place] VASC ACCESS RIGHT 11/30/2015 GAletta Edouard MD [facility]   IR GENERIC HISTORICAL Right 12/15/2015   IR THROMBECTOMY AV FISTULA W/THROMBOLYSIS/PTA/STENT INC/SHUNT/IMG RT 12/15/2015 DArne Cleveland MD [facility]   IR GENERIC HISTORICAL  12/15/2015   IR [place] VASC ACCESS RIGHT 12/15/2015 DArne Cleveland MD [facility]   IR GENERIC HISTORICAL  12/28/2015   IR FLUORO GUIDE CV LINE RIGHT 12/28/2015 AMarybelle Killings MD [facility]   IR GENERIC HISTORICAL  12/28/2015   IR [place] VASC ACCESS RIGHT 12/28/2015 AMarybelle Killings MD [facility]   LEFT A ND RIGHT HEART CATH  01/30/2013   DR BSung Amabile  LEFT AND RIGHT HEART CATHETERIZATION WITH CORONARY ANGIOGRAM N/A 01/30/2013   Procedure: LEFT AND RIGHT HEART CATHETERIZATION WITH CORONARY ANGIOGRAM;  Surgeon: DJolaine Artist MD;  Location: [place] [facility];  Service: Cardiovascular;  Laterality: N/A;   PERIPHERAL VASCULAR CATHETERIZATION Right 01/26/2015   Procedure: A/V Fistulagram;  Surgeon: Angelia Mould, MD;  Location: [place] [facility];  Service: Cardiovascular;  Laterality: Right;   reapea urethral surgery for recurrent obstruction  2011   TOTAL KNEE ARTHROPLASTY Right 2007   VEIN HARVEST Right 03/31/2016   Procedure: RIGHT GREATER SAPHENOUS VEIN HARVEST;  Surgeon: Angelia Mould, MD;  Location: [place];  Service: Vascular;  Laterality: Right;    reports that he quit smoking about 10 years ago. His smoking use included cigarettes. He has a 64.00 pack-year smoking history. He has never used smokeless tobacco. He reports that he does not drink alcohol and does not use drugs. family history includes Alcohol abuse in his father; Bladder Cancer in his  mother; Diabetes in his maternal grandmother; Heart Problems in his maternal grandmother; Heart disease in his maternal grandfather; Melanoma in his father; Prostate cancer in his maternal grandfather; Stroke in his maternal grandmother. Allergies  Allergen Reactions   Epoetin Alfa Other (See Comments)    unknown   Ferumoxytol Other (See Comments)    unknown   Morphine Sulfate Rash and Other (See Comments)    Itches all over, red spots      Observations/Objective: Patient sounds cheerful and well on the phone. I do not appreciate any SOB. Speech and thought processing are grossly intact. Patient reported vitals:  Assessment and Plan:  COVID 19 infection.  Symptoms are relatively mild at this time but patient is extremely high risk with his multiple comorbidities.  -Monitor O2 sats and recommend ER evaluation for O2 sats less than 90% -Zofran 8 mg ODT 1 every 8 hours as needed for nausea and vomiting -Start molnupiravir 4 capsules by mouth twice daily.  We are avoiding Paxlovid because of potential interaction with several of his medications. -There is no renal adjustment for patients on hemodialysis with molnupiravir -He is aware of isolation precautions and he has notified his dialysis center  Follow Up Instructions:   [id number] [id number] [id number] 11-20 [id number] 21-30 I did not refer this patient for an OV in the next 24 hours for this/these issue(s).  I discussed the assessment and treatment plan with the patient. The patient was provided an opportunity to ask questions and all were answered. The patient agreed with the plan and demonstrated an understanding of the instructions.   The patient was advised to call back or seek an in-person evaluation if the symptoms worsen or if the condition fails to improve as anticipated.  I provided 14 minutes of non-face-to-face time during this encounter.   Carolann Littler, MD

## 2021-02-19 DIAGNOSIS — D509 Iron deficiency anemia, unspecified: Secondary | ICD-10-CM | POA: Diagnosis not present

## 2021-02-19 DIAGNOSIS — Z23 Encounter for immunization: Secondary | ICD-10-CM | POA: Diagnosis not present

## 2021-02-19 DIAGNOSIS — N186 End stage renal disease: Secondary | ICD-10-CM | POA: Diagnosis not present

## 2021-02-19 DIAGNOSIS — D631 Anemia in chronic kidney disease: Secondary | ICD-10-CM | POA: Diagnosis not present

## 2021-02-19 DIAGNOSIS — N2581 Secondary hyperparathyroidism of renal origin: Secondary | ICD-10-CM | POA: Diagnosis not present

## 2021-02-19 DIAGNOSIS — Z992 Dependence on renal dialysis: Secondary | ICD-10-CM | POA: Diagnosis not present

## 2021-02-22 DIAGNOSIS — Z992 Dependence on renal dialysis: Secondary | ICD-10-CM | POA: Diagnosis not present

## 2021-02-22 DIAGNOSIS — D631 Anemia in chronic kidney disease: Secondary | ICD-10-CM | POA: Diagnosis not present

## 2021-02-22 DIAGNOSIS — Z23 Encounter for immunization: Secondary | ICD-10-CM | POA: Diagnosis not present

## 2021-02-22 DIAGNOSIS — N186 End stage renal disease: Secondary | ICD-10-CM | POA: Diagnosis not present

## 2021-02-22 DIAGNOSIS — N2581 Secondary hyperparathyroidism of renal origin: Secondary | ICD-10-CM | POA: Diagnosis not present

## 2021-02-22 DIAGNOSIS — D509 Iron deficiency anemia, unspecified: Secondary | ICD-10-CM | POA: Diagnosis not present

## 2021-02-23 ENCOUNTER — Ambulatory Visit (INDEPENDENT_AMBULATORY_CARE_PROVIDER_SITE_OTHER): Payer: Medicare Other | Admitting: Family Medicine

## 2021-02-23 ENCOUNTER — Other Ambulatory Visit: Payer: Self-pay

## 2021-02-23 ENCOUNTER — Encounter (HOSPITAL_COMMUNITY): Payer: Self-pay | Admitting: *Deleted

## 2021-02-23 ENCOUNTER — Ambulatory Visit (HOSPITAL_BASED_OUTPATIENT_CLINIC_OR_DEPARTMENT_OTHER)
Admission: RE | Admit: 2021-02-23 | Discharge: 2021-02-23 | Disposition: A | Discharge status: Home / Self Care | Payer: Medicare Other | Source: Ambulatory Visit | Attending: Family Medicine | Admitting: Family Medicine

## 2021-02-23 VITALS — BP 150/60 | HR 98 | Temp 97.6°F | Wt 193.9 lb

## 2021-02-23 DIAGNOSIS — R0781 Pleurodynia: Secondary | ICD-10-CM

## 2021-02-23 DIAGNOSIS — R55 Syncope and collapse: Secondary | ICD-10-CM

## 2021-02-23 DIAGNOSIS — R079 Chest pain, unspecified: Secondary | ICD-10-CM | POA: Diagnosis not present

## 2021-02-23 DIAGNOSIS — R0789 Other chest pain: Secondary | ICD-10-CM | POA: Diagnosis not present

## 2021-02-23 NOTE — Patient Instructions (Signed)
Go for x-rays at the [facility] facility.

## 2021-02-23 NOTE — Progress Notes (Signed)
Established Patient Office Visit  Subjective:  Patient ID: Anthony Bullock, male    DOB: 07-31-1960  Age: 61 y.o. MRN: [id number]  CC:  Chief Complaint  Patient presents with   Loss of Consciousness    Last Wednesday, fell and hit left side, and a defibrillator in this location    HPI Anthony Bullock presents following syncopal episode last Wednesday at home.  Has multiple chronic problems including history of CAD, history of systolic heart failure, hypertension, type 2 diabetes, hyperlipidemia, end-stage renal disease on hemodialysis.  He has defibrillator in place with past history of severe systolic dysfunction but last echo 2021 showed ejection fraction 50%.  He states his defibrillator is "turned off ".  Recent history is that he had COVID infection last week.  Was started on molnupiravir.  He had dialysis last Tuesday and apparently did have some vomiting Tuesday preceding his syncopal episode Wednesday around noon.  He also had some coughing prior to his episode.  He does have diabetes and blood sugars not checked.  He was estimated altered consciousness for about 2 minutes.  No witnessed seizure activity.  No known head injury.  They think he fell against the wall striking his left side.  He has left side pain both anterior and posterior rib cage area.  No headache.  No confusion.  No further dizziness.  He does feel overall improved since COVID diagnosis.  Wife also had COVID.  No further vomiting.  No recent focal weakness.  Past Medical History:  Diagnosis Date   AICD (automatic cardioverter/defibrillator) present    [employer]   Allergic rhinitis    Anemia    Arthritis    Chronic systolic heart failure ([place])    a. ECHO (12/2012) EF 25-30%, HK entireanteroseptal myocardium //  b.  EF 25%, diffuse HK, grade 1 diastolic dysfunction, MAC, mild LAE, normal RVSF, trivial pericardial effusion   COPD (chronic obstructive pulmonary disease) ([place])    Diabetes mellitus type II     Diabetic nephropathy ([place])    Diabetic neuropathy ([place])    ESRD on hemodialysis ([place])    started HD June 2017, goes to [facility] HD unit, Dr Hinda Lenis   History of cardiac catheterization    a.Myoview 1/15:  There is significant left ventricular dysfunction. There may be slight scar at the apex. There is no significant ischemia. LV Ejection Fraction: 27%  //  b. RHC/LHC (1/15) with mean RA 6, PA 47/22 mean 33, mean PCWP 20, PVR 2.5 WU, CI 2.5; 80% dLAD stenosis, 70% diffuse large D.     History of kidney stones    Hyperlipidemia    Hypertension    Kidney stones    NICM (nonischemic cardiomyopathy) ([place])    Primarily nonischemic.  Echo (12/14) with EF 25-30%.  Echo (3/15) with EF 25%, mild to moderately dilated LV, normal RV size and systolic function.     Osteomyelitis ([place])    left fifth ray   Pneumonia    Urethral stricture    Wears glasses     Past Surgical History:  Procedure Laterality Date   ABDOMINAL AORTOGRAM W/LOWER EXTREMITY N/A 03/30/2016   Procedure: Abdominal Aortogram w/Lower Extremity;  Surgeon: Angelia Mould, MD;  Location: [place] [facility];  Service: Cardiovascular;  Laterality: N/A;   AMPUTATION Right 04/26/2016   Procedure: Right Below Knee Amputation;  Surgeon: Newt Minion, MD;  Location: [place];  Service: Orthopedics;  Laterality: Right;   AMPUTATION Left 08/21/2019  Procedure: LEFT FOOT 5TH RAY AMPUTATION;  Surgeon: Newt Minion, MD;  Location: [place];  Service: Orthopedics;  Laterality: Left;   AMPUTATION Left 11/13/2019   Procedure: LEFT BELOW KNEE AMPUTATION;  Surgeon: Newt Minion, MD;  Location: [place];  Service: Orthopedics;  Laterality: Left;   AV FISTULA PLACEMENT Right 09/08/2015   Procedure: INSERTION OF 4-81m x 45cm  ARTERIOVENOUS (AV) GORE-TEX GRAFT RIGHT UPPER  ARM;  Surgeon: CAngelia Mould MD;  [place]  Service: Vascular;  Laterality: Right;   AV FISTULA PLACEMENT Left 01/14/2016   Procedure: CREATION OF LEFT UPPER ARM  ARTERIOVENOUS FISTULA;  Surgeon: CAngelia Mould MD;  [place]  Service: Vascular;  Laterality: Left;   [place] 08/22/2014   Procedure: RIGHT [place]PPER ARM [place]  Surgeon: CAngelia Mould MD;  [place]  Service: Vascular;  Laterality: Right;   BELOW KNEE LEG AMPUTATION Right 04/26/2016   CARDIAC CATHETERIZATION     CARDIAC DEFIBRILLATOR PLACEMENT  06/27/2013   Sub Q       BY DR KCaryl Comes  CATARACT EXTRACTION W/PHACO Right 08/06/2018   Procedure: CATARACT EXTRACTION PHACO AND INTRAOCULAR LENS PLACEMENT ([place];  Surgeon: WBaruch Goldmann MD;  Location: [facility];  Service: Ophthalmology;  Laterality: Right;  CDE: 4.06   CATARACT EXTRACTION W/PHACO Left 08/20/2018   Procedure: CATARACT EXTRACTION PHACO AND INTRAOCULAR LENS PLACEMENT (IOC);  Surgeon: WBaruch Goldmann MD;  Location: [facility];  Service: Ophthalmology;  Laterality: Left;  CDE: 6.76   COLONOSCOPY WITH PROPOFOL N/A 07/22/2015   Procedure: COLONOSCOPY WITH PROPOFOL;  Surgeon: HDoran Stabler MD;  [facility];  Service: Gastroenterology;  Laterality: N/A;   FEMORAL-POPLITEAL BYPASS GRAFT Right 03/31/2016   Procedure: BYPASS GRAFT FEMORAL-POPLITEAL ARTERY USING RIGHT GREATER SAPHENOUS NONREVERSED VEIN;  Surgeon: CAngelia Mould MD;  [place]  Service: Vascular;  Laterality: Right;   HERNIA REPAIR     I & D EXTREMITY Right 03/31/2016   Procedure: IRRIGATION AND DEBRIDEMENT FOOT;  Surgeon: CAngelia Mould MD;  [place]  Service: Vascular;  Laterality: Right;   IMPLANTABLE CARDIOVERTER DEFIBRILLATOR IMPLANT N/A 06/27/2013   Procedure: SUB Q ICD;  Surgeon: SDeboraha Sprang MD;  [facility];  Service: Cardiovascular;  Laterality: N/A;   INTRAOPERATIVE ARTERIOGRAM Right 03/31/2016   Procedure: INTRA OPERATIVE ARTERIOGRAM;  Surgeon: CAngelia Mould MD;  [place]  Service: Vascular;  Laterality: Right;   IR GENERIC HISTORICAL  Right 11/30/2015   IR THROMBECTOMY AV FISTULA W/THROMBOLYSIS/PTA INC/SHUNT/IMG RIGHT 11/30/2015 GAletta Edouard MD [facility]   IR GENERIC HISTORICAL  11/30/2015   IR [place] ACC[place] RIGHT 11/30/2015 GAletta Edouard MD [facility]   IR GENERIC HISTORICAL Right 12/15/2015   IR THROMBECTOMY AV FISTULA W/THROMBOLYSIS/PTA/STENT INC/SHUNT/IMG RT 12/15/2015 DArne Cleveland MD [facility]   IR GENERIC HISTORICAL  12/15/2015   IR [place] VASC ACCESS RIGHT 12/15/2015 DArne Cleveland MD [facility]   IR GENERIC HISTORICAL  12/28/2015   IR FLUORO GUIDE CV LINE RIGHT 12/28/2015 AMarybelle Killings MD [facility]   IR GENERIC HISTORICAL  12/28/2015   IR [place] VASC ACCESS RIGHT 12/28/2015 AMarybelle Killings MD [facility]   LEFT A ND RIGHT HEART CATH  01/30/2013   DR BSung Amabile  LEFT AND RIGHT HEART CATHETERIZATION WITH CORONARY ANGIOGRAM N/A 01/30/2013   Procedure: LEFT AND RIGHT HEART CATHETERIZATION WITH CORONARY ANGIOGRAM;  Surgeon: DJolaine Artist MD;  [facility];  Service: Cardiovascular;  Laterality: N/A;   PERIPHERAL VASCULAR CATHETERIZATION Right 01/26/2015   Procedure: A/V Fistulagram;  Surgeon: Angelia Mould, MD;  Location: [place] [facility];  Service: Cardiovascular;  Laterality: Right;   reapea urethral surgery for recurrent obstruction  2011   TOTAL KNEE ARTHROPLASTY Right 2007   VEIN HARVEST Right 03/31/2016   Procedure: RIGHT GREATER SAPHENOUS VEIN HARVEST;  Surgeon: Angelia Mould, MD;  [facility];  Service: Vascular;  Laterality: Right;    Family History  Problem Relation Age of Onset   Bladder Cancer Mother    Alcohol abuse Father    Melanoma Father    Stroke Maternal Grandmother    Heart Problems Maternal Grandmother        unknown   Diabetes Maternal Grandmother    Heart disease Maternal Grandfather    Prostate cancer Maternal Grandfather     Social History   Socioeconomic History   Marital status: Married    Spouse name: Not on  file   Number of children: 0   Years of education: Not on file   Highest education level: Not on file  Occupational History   Not on file  Tobacco Use   Smoking status: Former    Packs/day: 2.00    Years: 32.00    Pack years: 64.00    Types: Cigarettes    Quit date: 05/11/2010    Years since quitting: 10.7   Smokeless tobacco: Never  Vaping Use   Vaping Use: Never used  Substance and Sexual Activity   Alcohol use: No   Drug use: No   Sexual activity: Yes  Other Topics Concern   Not on file  Social History Narrative   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   Social Determinants of Radio broadcast assistant Strain: Low Risk    Difficulty of Paying Living Expenses: Not hard at all  Food Insecurity: No Food Insecurity   Worried About Charity fundraiser in the Last Year: Never true   Arboriculturist in the Last Year: Never true  Transportation Needs: No Transportation Needs   Lack of Transportation (Medical): No   Lack of Transportation (Non-Medical): No  Physical Activity: Inactive   Days of Exercise per Week: 0 days   Minutes of Exercise per Session: 0 min  Stress: No Stress Concern Present   Feeling of Stress : Not at all  Social Connections: Socially Integrated   Frequency of Communication with Friends and Family: More than three times a week   Frequency of Social Gatherings with Friends and Family: More than three times a week   Attends Religious Services: More than 4 times per year   Active Member of [employer] or Organizations: Yes   Attends Music therapist: More than 4 times per year   Marital Status: Married  Human resources officer Violence: Not At Risk   Fear of Current or Ex-Partner: No   Emotionally Abused: No   Physically Abused: No   Sexually Abused: No    Outpatient Medications Prior to Visit  Medication Sig Dispense Refill   acetaminophen (TYLENOL) 325 MG tablet Take 1-2 tablets (325-650 mg total) by mouth every 4 (four) hours as needed for mild pain.      albuterol (VENTOLIN HFA) 108 (90 Base) MCG/ACT inhaler Inhale 2 puffs into the lungs every 4 (four) hours as needed for wheezing or shortness of breath. 1 each 1   aspirin EC 81 MG tablet Take  81 mg by mouth daily.      atorvastatin (LIPITOR) 80 MG tablet Take 1 tablet (80 mg total) by mouth at bedtime. 90 tablet 3   azelastine (ASTELIN) 0.1 % nasal spray Place 2 sprays into both nostrils 3 (three) times daily as needed for rhinitis. Use in each nostril as directed 30 mL 12   CALCITRIOL PO Take by mouth 3 (three) times a week. ONLY PROVIDED AT DIALYSIS     carvedilol (COREG) 12.5 MG tablet Take 12.5 mg by mouth 2 (two) times daily.     cinacalcet (SENSIPAR) 30 MG tablet Take 1 tablet (30 mg total) by mouth Every Tuesday,Thursday,and Saturday with dialysis. 60 tablet    Continuous Blood Gluc Sensor (FREESTYLE LIBRE 14 DAY SENSOR) MISC Use as directed 4 each 11   fenofibrate 160 MG tablet Take 1 tablet (160 mg total) by mouth daily. 90 tablet 3   fexofenadine (ALLEGRA) 180 MG tablet Take 180 mg by mouth daily.     furosemide (LASIX) 40 MG tablet Take 3 tablets (120 mg total) by mouth daily. 270 tablet 3   gabapentin (NEURONTIN) 300 MG capsule Take 2 capsules (600 mg total) by mouth 2 (two) times daily. 360 capsule 1   glucose blood test strip Check 1 time daily. E11.9 One Touch Ultra Blue Test Strips 100 each 3   HUMALOG KWIKPEN 200 UNIT/ML KwikPen USE TWICE DAILY WITH LUNCH AND SUPPER PER SLIDING SCALE (MAXIMUN DAILY DOSE OF 28 UNIT) - APPOINTMENT REQUIRED FOR FURTHER REFILLS 6 mL 0   insulin glargine (LANTUS SOLOSTAR) 100 UNIT/ML Solostar Pen Inject 12 units subcutaneously at bed time. (Patient taking differently: 16 Units. Inject 16 units subcutaneously at bed time.) 15 mL 2   Insulin Pen Needle (BD PEN NEEDLE NANO U/F) 32G X 4 MM MISC USE 1 PEN NEEDLE SUBCUTANEOUSLY WITH INSULIN 4 TIMES DAILY 400 each 0   lanthanum (FOSRENOL) 500 MG chewable tablet Chew 500-1,000 mg by mouth See admin  instructions. Take 1000 mg with meals three time a day and 500 mg with snacks     midodrine (PROAMATINE) 10 MG tablet Take 10 mg by mouth 3 (three) times a week.     multivitamin (RENA-VIT) TABS tablet Take 1 tablet by mouth daily. 90 tablet 3   Olopatadine HCl 0.2 % SOLN Place 1 drop into both eyes daily as needed (for allergies).      ondansetron (ZOFRAN ODT) 8 MG disintegrating tablet Take 1 tablet (8 mg total) by mouth every 8 (eight) hours as needed for nausea or vomiting. 15 tablet 0   No facility-administered medications prior to visit.    Allergies  Allergen Reactions   Epoetin Alfa Other (See Comments)    unknown   Ferumoxytol Other (See Comments)    unknown   Morphine Sulfate Rash and Other (See Comments)    Itches all over, red spots    ROS Review of Systems  Constitutional:  Negative for chills and fever.  Respiratory:  Positive for cough. Negative for shortness of breath and wheezing.   Cardiovascular:  Negative for chest pain.  Gastrointestinal:  Negative for abdominal pain.  Neurological:        See HPI     Objective:    Physical Exam Vitals reviewed.  Constitutional:      Appearance: Normal appearance.  Cardiovascular:     Rate and Rhythm: Normal rate.  Pulmonary:     Effort: Pulmonary effort is normal.     Breath sounds: Normal breath sounds.  Abdominal:     Palpations: Abdomen is soft.  Musculoskeletal:     Comments: He has some tenderness left posterior rib cage and mild tenderness left anterior rib cage.  No visible bruising.  No visible swelling.  Neurological:     Mental Status: He is alert.    BP (!) 150/60 (BP Location: Left Arm, Patient Position: Sitting, Cuff Size: Normal)    Pulse 98    Temp 97.6 F (36.4 C) (Oral)    Wt 193 lb 14.4 oz (88 kg)    SpO2 98%    BMI 27.82 kg/m  Wt Readings from Last 3 Encounters:  02/23/21 193 lb 14.4 oz (88 kg)  01/22/21 196 lb (88.9 kg)  01/18/21 195 lb 9.6 oz (88.7 kg)     Health Maintenance Due   Topic Date Due   COVID-19 Vaccine (2 - Moderna risk series) 05/16/2019   FOOT EXAM  03/19/2020    There are no preventive care reminders to display for this patient.  Lab Results  Component Value Date   TSH 1.61 06/26/2020   Lab Results  Component Value Date   WBC 8.0 11/28/2019   HGB 10.0 (L) 11/28/2019   HCT 32.1 (L) 11/28/2019   MCV 92.5 11/28/2019   PLT 148 (L) 11/28/2019   Lab Results  Component Value Date   NA 134 (L) 11/28/2019   K 5.6 (H) 11/28/2019   CO2 26 11/28/2019   GLUCOSE 139 (H) 11/28/2019   BUN 63 (H) 11/28/2019   CREATININE 10.21 (H) 11/28/2019   BILITOT 0.5 06/26/2020   ALKPHOS 50 09/04/2019   AST 30 06/26/2020   ALT 19 06/26/2020   PROT 6.1 06/26/2020   ALBUMIN 2.8 (L) 11/28/2019   CALCIUM 9.2 11/28/2019   ANIONGAP 12 11/28/2019   GFR 12.30 (LL) 06/27/2014   Lab Results  Component Value Date   CHOL 101 06/26/2020   Lab Results  Component Value Date   HDL 37 (L) 06/26/2020   Lab Results  Component Value Date   LDLCALC 42 06/26/2020   Lab Results  Component Value Date   TRIG 140 06/26/2020   Lab Results  Component Value Date   CHOLHDL 2.7 06/26/2020   Lab Results  Component Value Date   HGBA1C 8.1 (A) 01/18/2021      Assessment & Plan:   Patient with multiple medical problems as above presented with brief syncopal episode at home following recent COVID illness.  No recent hypoglycemia.  No suggestion of seizure activity.  Does have cardiac history as above.  Question is whether this is related to his vomiting and potential hypotension versus other etiology of syncope.  He has felt fine since then other than some left rib pain.  No further dizziness.  Blood pressure actually up slightly today 150/60.  -Check EKG-sinus rhythm with no acute changes. -He has scheduled follow-up with cardiology soon -Consider left-sided rib films and chest x-ray -Follow-up immediately for any recurrent dizziness or syncope.   No orders of the  defined types were placed in this encounter.   Follow-up: No follow-ups on file.    Carolann Littler, MD

## 2021-02-24 DIAGNOSIS — D631 Anemia in chronic kidney disease: Secondary | ICD-10-CM | POA: Diagnosis not present

## 2021-02-24 DIAGNOSIS — Z23 Encounter for immunization: Secondary | ICD-10-CM | POA: Diagnosis not present

## 2021-02-24 DIAGNOSIS — N186 End stage renal disease: Secondary | ICD-10-CM | POA: Diagnosis not present

## 2021-02-24 DIAGNOSIS — N2581 Secondary hyperparathyroidism of renal origin: Secondary | ICD-10-CM | POA: Diagnosis not present

## 2021-02-24 DIAGNOSIS — D509 Iron deficiency anemia, unspecified: Secondary | ICD-10-CM | POA: Diagnosis not present

## 2021-02-24 DIAGNOSIS — Z992 Dependence on renal dialysis: Secondary | ICD-10-CM | POA: Diagnosis not present

## 2021-02-25 ENCOUNTER — Telehealth (INDEPENDENT_AMBULATORY_CARE_PROVIDER_SITE_OTHER): Payer: Medicare Other | Admitting: Family Medicine

## 2021-02-25 ENCOUNTER — Other Ambulatory Visit: Payer: Self-pay

## 2021-02-25 ENCOUNTER — Encounter: Payer: Self-pay | Admitting: Family Medicine

## 2021-02-25 DIAGNOSIS — U071 COVID-19: Secondary | ICD-10-CM

## 2021-02-25 MED ORDER — BENZONATATE 100 MG PO CAPS: ORAL_CAPSULE | ORAL | 0 refills | Status: DC

## 2021-02-25 NOTE — Progress Notes (Signed)
Virtual Visit via Telephone Note  I connected with Anthony Bullock on 02/25/21 at 11:20 AM EST by telephone and verified that I am speaking with the correct person using two identifiers.   I discussed the limitations of performing an evaluation and management service by telephone and requested permission for a phone visit. The patient expressed understanding and agreed to proceed.  Location patient:  [place] Location provider: work or home office Participants present for the call: patient, provider Patient did not have a visit with me in the prior 7 days to address this/these issue(s).   History of Present Illness:  Acute telemedicine visit for Covid19: -Onset: last week - reports he was started on Legevrio and was feeling a little better -Symptoms include: still having some symptoms (cough, nasal congestion) -still testing positive today and yesterday -Denies:fever, CP, SOB, vomiting, nausea -able to ear/drink, get up and down -Pertinent past medical history: see below -Pertinent medication allergies:  Allergies  Allergen Reactions   Epoetin Alfa Other (See Comments)    unknown   Ferumoxytol Other (See Comments)    unknown   Morphine Sulfate Rash and Other (See Comments)    Itches all over, red spots  -COVID-19 vaccine status: Immunization History  Administered Date(s) Administered   Fluad Quad(high Dose 65+) 10/16/2019   Hepatitis B, adult 07/27/2015, 08/28/2015, 09/30/2015, 02/01/2016, 03/21/2016   Influenza Split 11/10/2010, 11/10/2011, 10/03/2012   Influenza Whole 01/13/2010   Influenza, High Dose Seasonal PF 10/10/2020   Influenza,inj,Quad PF,6+ Mos 09/29/2014, 10/10/2017   Influenza-Unspecified 09/10/2013, 09/20/2015, 10/25/2016, 10/31/2017, 10/11/2018, 10/16/2019   Moderna Covid Bivalent Peds Booster(18moThru 587yr 11/07/2020   Moderna Sars-Covid-2 Vaccination 04/18/2019   PPD Test 04/29/2015, 07/07/2015, 07/07/2015, 07/21/2015, 07/21/2015, 02/01/2016, 02/01/2016,  02/09/2017, 02/09/2017, 02/15/2018, 02/15/2018, 06/20/2019, 06/20/2019   Pneumococcal Conjugate-13 09/23/2015   Pneumococcal Polysaccharide-23 09/23/2015   Pneumococcal-Unspecified 09/23/2015   Td 10/03/2008      Past Medical History:  Diagnosis Date   AICD (automatic cardioverter/defibrillator) present    [employer]   Allergic rhinitis    Anemia    Arthritis    Chronic systolic heart failure ([facility]   a. ECHO (12/2012) EF 25-30%, HK entireanteroseptal myocardium //  b.  EF 25%, diffuse HK, grade 1 diastolic dysfunction, MAC, mild LAE, normal RVSF, trivial pericardial effusion   COPD (chronic obstructive pulmonary disease) ([facility]   Diabetes mellitus type II    Diabetic nephropathy ([facility]   Diabetic neuropathy ([facility]   ES[facility] [place] hemodialysis ([facility]   [facility] BeHinda Lenis History of cardiac catheterization    a.Myoview 1/15:  There is significant left ventricular dysfunction. There may be slight scar at the apex. There is no significant ischemia. LV Ejection Fraction: 27%  //  b. RHC/LHC (1/15) with mean RA 6, PA 47/22 mean 33, mean PCWP 20, PVR 2.5 WU, CI 2.5; 80% dLAD stenosis, 70% diffuse large D.     History of kidney stones    Hyperlipidemia    Hypertension    Kidney stones    NICM (nonischemic cardiomyopathy) ([facility]   Primarily nonischemic.  Echo (12/14) with EF 25-30%.  Echo (3/15) with EF 25%, mild to moderately dilated LV, normal RV size and systolic function.     Osteomyelitis ([facility]   left fifth ray   Pneumonia    Urethral stricture    Wears glasses     Current Outpatient Medications on File Prior to Visit  Medication Sig Dispense Refill   acetaminophen (TYLENOL) 325 MG tablet Take 1-2 tablets (325-650 mg total) by mouth every 4 (four) hours as needed for mild pain.     albuterol (VENTOLIN HFA) 108 (90 Base) MCG/ACT inhaler Inhale 2 puffs into the lungs every 4 (four) hours as needed for wheezing or shortness of breath.  1 each 1   aspirin EC 81 MG tablet Take 81 mg by mouth daily.      atorvastatin (LIPITOR) 80 MG tablet Take 1 tablet (80 mg total) by mouth at bedtime. 90 tablet 3   azelastine (ASTELIN) 0.1 % nasal spray Place 2 sprays into both nostrils 3 (three) times daily as needed for rhinitis. Use in each nostril as directed 30 mL 12   CALCITRIOL PO Take by mouth 3 (three) times a week. ONLY PROVIDED AT DIALYSIS     carvedilol (COREG) 12.5 MG tablet Take 12.5 mg by mouth 2 (two) times daily.     cinacalcet (SENSIPAR) 30 MG tablet Take 1 tablet (30 mg total) by mouth Every Tuesday,Thursday,and Saturday with dialysis. 60 tablet    Continuous Blood Gluc Sensor (FREESTYLE LIBRE 14 DAY SENSOR) MISC Use as directed 4 each 11   fenofibrate 160 MG tablet Take 1 tablet (160 mg total) by mouth daily. 90 tablet 3   fexofenadine (ALLEGRA) 180 MG tablet Take 180 mg by mouth daily.     furosemide (LASIX) 40 MG tablet Take 3 tablets (120 mg total) by mouth daily. 270 tablet 3   gabapentin (NEURONTIN) 300 MG capsule Take 2 capsules (600 mg total) by mouth 2 (two) times daily. (Patient taking differently: Take 600 mg by mouth. Take 1 capsule every morning and 2 capsules every evening) 360 capsule 1   glucose blood test strip Check 1 time daily. E11.9 One Touch Ultra Blue Test Strips 100 each 3   HUMALOG KWIKPEN 200 UNIT/ML KwikPen USE TWICE DAILY WITH LUNCH AND SUPPER PER SLIDING SCALE (MAXIMUN DAILY DOSE OF 28 UNIT) - APPOINTMENT REQUIRED FOR FURTHER REFILLS 6 mL 0   insulin glargine (LANTUS SOLOSTAR) 100 UNIT/ML Solostar Pen Inject 12 units subcutaneously at bed time. (Patient taking differently: 16 Units. Inject 16 units subcutaneously at bed time.) 15 mL 2   Insulin Pen Needle (BD PEN NEEDLE NANO U/F) 32G X 4 MM MISC USE 1 PEN NEEDLE SUBCUTANEOUSLY WITH INSULIN 4 TIMES DAILY 400 each 0   lanthanum (FOSRENOL) 500 MG chewable tablet Chew 500-1,000 mg by mouth See admin instructions. Take 1000 mg with meals three time a day  and 500 mg with snacks     midodrine (PROAMATINE) 10 MG tablet Take 10 mg by mouth 3 (three) times a week.     multivitamin (RENA-VIT) TABS tablet Take 1 tablet by mouth daily. 90 tablet 3   Olopatadine HCl 0.2 % SOLN Place 1 drop into both eyes daily as needed (for allergies).      ondansetron (ZOFRAN ODT) 8 MG disintegrating tablet Take 1 tablet (8 mg total) by mouth every 8 (eight) hours as needed for nausea or vomiting. 15 tablet 0   No current facility-administered medications on file prior to visit.    Observations/Objective: Patient sounds cheerful and well on the phone. I do not appreciate any SOB. Speech and thought processing are grossly intact. Patient reported vitals:  Assessment and Plan:  COVID-19  -we discussed possible serious and likely etiologies, potential complicatoins and precautions, options for evaluation and workup, limitations of telemedicine visit vs in person visit, treatment, treatment  risks and precautions. Pt prefers to treat via telemedicine empirically rather than in person at this moment. HAs ongoing covid infection w/ symptoms - not unusual with these new variants. He opted to try some nasal saline rinses and requested Rx for Tessalon for the cough.  Advised to seek prompt virtual visit or in person care if worsening, new symptoms arise, or if is not improving with treatment as expected per our conversation of expected course. Discussed options for follow up care. Did let this patient know that I do telemedicine on Tuesdays and Thursdays for [place] and those are the days I am logged into the system. Advised to schedule follow up visit with PCP,  virtual visits or [facility] if any further questions or concerns to avoid delays in care.   I discussed the assessment and treatment plan with the patient. The patient was provided an opportunity to ask questions and all were answered. The patient agreed with the plan and demonstrated an understanding of the  instructions.    Follow Up Instructions:  I did not refer this patient for an OV with me in the next 24 hours for this/these issue(s).  I discussed the assessment and treatment plan with the patient. The patient was provided an opportunity to ask questions and all were answered. The patient agreed with the plan and demonstrated an understanding of the instructions.   I spent 17 minutes on the date of this visit in the care of this patient. See summary of tasks completed to properly care for this patient in the detailed notes above which also included counseling of above, review of PMH, medications, allergies, evaluation of the patient and ordering and/or  instructing patient on testing and care options.     Lucretia Kern, DO

## 2021-02-25 NOTE — Patient Instructions (Addendum)
°  HOME CARE TIPS:   -I sent the medication(s) we discussed to your pharmacy: Meds ordered this encounter  Medications   benzonatate (TESSALON PERLES) 100 MG capsule    Sig: 1-2 capsules up to twice daily as needed for cough    Dispense:  30 capsule    Refill:  0   -can use tylenol if needed for fevers, aches and pains per instructions  -can use nasal saline a few times per day if you have nasal congestion  -follow up with your doctor in 2-3 days unless improving and feeling better  -stay home while sick, except to seek medical care. If you have COVID19, you will likely be contagious for 7-10 days. Flu or Influenza is likely contagious for about 7 days. Other respiratory viral infections remain contagious for 5-10+ days depending on the virus and many other factors. Wear a good mask that fits snugly (such as N95 or KN95) if around others to reduce the risk of transmission.  It was nice to meet you today, and I really hope you are feeling better soon. I help [facility] out with telemedicine visits on Tuesdays and Thursdays and am happy to help if you need a follow up virtual visit on those days. Otherwise, if you have any concerns or questions following this visit please schedule a follow up visit with your Primary Care doctor or seek care at a local urgent care clinic to avoid delays in care.    Seek in person care or schedule a follow up video visit promptly if your symptoms worsen, new concerns arise or you are not improving with treatment. Call 911 and/or seek emergency care if your symptoms are severe or life threatening.

## 2021-02-26 ENCOUNTER — Ambulatory Visit: Payer: Medicare Other | Admitting: Cardiology

## 2021-02-27 DIAGNOSIS — Z992 Dependence on renal dialysis: Secondary | ICD-10-CM | POA: Diagnosis not present

## 2021-02-27 DIAGNOSIS — N186 End stage renal disease: Secondary | ICD-10-CM | POA: Diagnosis not present

## 2021-02-27 DIAGNOSIS — D631 Anemia in chronic kidney disease: Secondary | ICD-10-CM | POA: Diagnosis not present

## 2021-02-27 DIAGNOSIS — D509 Iron deficiency anemia, unspecified: Secondary | ICD-10-CM | POA: Diagnosis not present

## 2021-02-27 DIAGNOSIS — Z23 Encounter for immunization: Secondary | ICD-10-CM | POA: Diagnosis not present

## 2021-02-27 DIAGNOSIS — N2581 Secondary hyperparathyroidism of renal origin: Secondary | ICD-10-CM | POA: Diagnosis not present

## 2021-03-02 DIAGNOSIS — Z992 Dependence on renal dialysis: Secondary | ICD-10-CM | POA: Diagnosis not present

## 2021-03-02 DIAGNOSIS — Z23 Encounter for immunization: Secondary | ICD-10-CM | POA: Diagnosis not present

## 2021-03-02 DIAGNOSIS — D631 Anemia in chronic kidney disease: Secondary | ICD-10-CM | POA: Diagnosis not present

## 2021-03-02 DIAGNOSIS — D509 Iron deficiency anemia, unspecified: Secondary | ICD-10-CM | POA: Diagnosis not present

## 2021-03-02 DIAGNOSIS — N2581 Secondary hyperparathyroidism of renal origin: Secondary | ICD-10-CM | POA: Diagnosis not present

## 2021-03-02 DIAGNOSIS — N186 End stage renal disease: Secondary | ICD-10-CM | POA: Diagnosis not present

## 2021-03-04 DIAGNOSIS — N2581 Secondary hyperparathyroidism of renal origin: Secondary | ICD-10-CM | POA: Diagnosis not present

## 2021-03-04 DIAGNOSIS — Z992 Dependence on renal dialysis: Secondary | ICD-10-CM | POA: Diagnosis not present

## 2021-03-04 DIAGNOSIS — Z23 Encounter for immunization: Secondary | ICD-10-CM | POA: Diagnosis not present

## 2021-03-04 DIAGNOSIS — D631 Anemia in chronic kidney disease: Secondary | ICD-10-CM | POA: Diagnosis not present

## 2021-03-04 DIAGNOSIS — D509 Iron deficiency anemia, unspecified: Secondary | ICD-10-CM | POA: Diagnosis not present

## 2021-03-04 DIAGNOSIS — N186 End stage renal disease: Secondary | ICD-10-CM | POA: Diagnosis not present

## 2021-03-06 DIAGNOSIS — D631 Anemia in chronic kidney disease: Secondary | ICD-10-CM | POA: Diagnosis not present

## 2021-03-06 DIAGNOSIS — D509 Iron deficiency anemia, unspecified: Secondary | ICD-10-CM | POA: Diagnosis not present

## 2021-03-06 DIAGNOSIS — N186 End stage renal disease: Secondary | ICD-10-CM | POA: Diagnosis not present

## 2021-03-06 DIAGNOSIS — Z23 Encounter for immunization: Secondary | ICD-10-CM | POA: Diagnosis not present

## 2021-03-06 DIAGNOSIS — Z992 Dependence on renal dialysis: Secondary | ICD-10-CM | POA: Diagnosis not present

## 2021-03-06 DIAGNOSIS — N2581 Secondary hyperparathyroidism of renal origin: Secondary | ICD-10-CM | POA: Diagnosis not present

## 2021-03-09 DIAGNOSIS — D509 Iron deficiency anemia, unspecified: Secondary | ICD-10-CM | POA: Diagnosis not present

## 2021-03-09 DIAGNOSIS — N186 End stage renal disease: Secondary | ICD-10-CM | POA: Diagnosis not present

## 2021-03-09 DIAGNOSIS — Z23 Encounter for immunization: Secondary | ICD-10-CM | POA: Diagnosis not present

## 2021-03-09 DIAGNOSIS — D631 Anemia in chronic kidney disease: Secondary | ICD-10-CM | POA: Diagnosis not present

## 2021-03-09 DIAGNOSIS — N2581 Secondary hyperparathyroidism of renal origin: Secondary | ICD-10-CM | POA: Diagnosis not present

## 2021-03-09 DIAGNOSIS — Z992 Dependence on renal dialysis: Secondary | ICD-10-CM | POA: Diagnosis not present

## 2021-03-11 DIAGNOSIS — Z20822 Contact with and (suspected) exposure to covid-19: Secondary | ICD-10-CM | POA: Diagnosis not present

## 2021-03-11 DIAGNOSIS — D509 Iron deficiency anemia, unspecified: Secondary | ICD-10-CM | POA: Diagnosis not present

## 2021-03-11 DIAGNOSIS — N2581 Secondary hyperparathyroidism of renal origin: Secondary | ICD-10-CM | POA: Diagnosis not present

## 2021-03-11 DIAGNOSIS — N186 End stage renal disease: Secondary | ICD-10-CM | POA: Diagnosis not present

## 2021-03-11 DIAGNOSIS — D631 Anemia in chronic kidney disease: Secondary | ICD-10-CM | POA: Diagnosis not present

## 2021-03-11 DIAGNOSIS — Z992 Dependence on renal dialysis: Secondary | ICD-10-CM | POA: Diagnosis not present

## 2021-03-13 DIAGNOSIS — Z992 Dependence on renal dialysis: Secondary | ICD-10-CM | POA: Diagnosis not present

## 2021-03-13 DIAGNOSIS — N2581 Secondary hyperparathyroidism of renal origin: Secondary | ICD-10-CM | POA: Diagnosis not present

## 2021-03-13 DIAGNOSIS — D509 Iron deficiency anemia, unspecified: Secondary | ICD-10-CM | POA: Diagnosis not present

## 2021-03-13 DIAGNOSIS — N186 End stage renal disease: Secondary | ICD-10-CM | POA: Diagnosis not present

## 2021-03-13 DIAGNOSIS — D631 Anemia in chronic kidney disease: Secondary | ICD-10-CM | POA: Diagnosis not present

## 2021-03-16 DIAGNOSIS — N186 End stage renal disease: Secondary | ICD-10-CM | POA: Diagnosis not present

## 2021-03-16 DIAGNOSIS — N2581 Secondary hyperparathyroidism of renal origin: Secondary | ICD-10-CM | POA: Diagnosis not present

## 2021-03-16 DIAGNOSIS — Z992 Dependence on renal dialysis: Secondary | ICD-10-CM | POA: Diagnosis not present

## 2021-03-16 DIAGNOSIS — D509 Iron deficiency anemia, unspecified: Secondary | ICD-10-CM | POA: Diagnosis not present

## 2021-03-16 DIAGNOSIS — D631 Anemia in chronic kidney disease: Secondary | ICD-10-CM | POA: Diagnosis not present

## 2021-03-17 ENCOUNTER — Other Ambulatory Visit: Payer: Self-pay | Admitting: Family Medicine

## 2021-03-18 DIAGNOSIS — N2581 Secondary hyperparathyroidism of renal origin: Secondary | ICD-10-CM | POA: Diagnosis not present

## 2021-03-18 DIAGNOSIS — D631 Anemia in chronic kidney disease: Secondary | ICD-10-CM | POA: Diagnosis not present

## 2021-03-18 DIAGNOSIS — N186 End stage renal disease: Secondary | ICD-10-CM | POA: Diagnosis not present

## 2021-03-18 DIAGNOSIS — D509 Iron deficiency anemia, unspecified: Secondary | ICD-10-CM | POA: Diagnosis not present

## 2021-03-18 DIAGNOSIS — Z992 Dependence on renal dialysis: Secondary | ICD-10-CM | POA: Diagnosis not present

## 2021-03-20 DIAGNOSIS — Z992 Dependence on renal dialysis: Secondary | ICD-10-CM | POA: Diagnosis not present

## 2021-03-20 DIAGNOSIS — D631 Anemia in chronic kidney disease: Secondary | ICD-10-CM | POA: Diagnosis not present

## 2021-03-20 DIAGNOSIS — N186 End stage renal disease: Secondary | ICD-10-CM | POA: Diagnosis not present

## 2021-03-20 DIAGNOSIS — D509 Iron deficiency anemia, unspecified: Secondary | ICD-10-CM | POA: Diagnosis not present

## 2021-03-20 DIAGNOSIS — N2581 Secondary hyperparathyroidism of renal origin: Secondary | ICD-10-CM | POA: Diagnosis not present

## 2021-03-23 DIAGNOSIS — D631 Anemia in chronic kidney disease: Secondary | ICD-10-CM | POA: Diagnosis not present

## 2021-03-23 DIAGNOSIS — N2581 Secondary hyperparathyroidism of renal origin: Secondary | ICD-10-CM | POA: Diagnosis not present

## 2021-03-23 DIAGNOSIS — N186 End stage renal disease: Secondary | ICD-10-CM | POA: Diagnosis not present

## 2021-03-23 DIAGNOSIS — Z992 Dependence on renal dialysis: Secondary | ICD-10-CM | POA: Diagnosis not present

## 2021-03-23 DIAGNOSIS — D509 Iron deficiency anemia, unspecified: Secondary | ICD-10-CM | POA: Diagnosis not present

## 2021-03-24 ENCOUNTER — Telehealth: Payer: Self-pay | Admitting: *Deleted

## 2021-03-24 NOTE — Chronic Care Management (AMB) (Signed)
?  Care Management  ? ?Note ? ?03/24/2021 ?Name: SIGISMUND CROSS MRN: [id number] DOB: 08/08/1960 ? ?ESTEVAN KERSH is a 61 y.o. year old male who is a primary care patient of Burchette, Alinda Sierras, MD. I reached out to [name] by phone today in response to a referral sent by Mr. OMER PUCCINELLI primary care provider.  ? ?Mr. Burley was given information about care management services today including:  ?Care management services include personalized support from designated clinical staff supervised by his physician, including individualized plan of care and coordination with other care providers ?24/7 contact phone numbers for assistance for urgent and routine care needs. ?The patient may stop care management services at any time by phone call to the office staff. ? ?Patient agreed to services and verbal consent obtained.  ? ?Follow up plan: ?Telephone appointment with care management team member scheduled for:03/29/21 ? ?Laverda Sorenson  ?Care Guide, Embedded Care Coordination ?[facility]  ?Direct Dial: [phone] ? ?

## 2021-03-25 DIAGNOSIS — D631 Anemia in chronic kidney disease: Secondary | ICD-10-CM | POA: Diagnosis not present

## 2021-03-25 DIAGNOSIS — N2581 Secondary hyperparathyroidism of renal origin: Secondary | ICD-10-CM | POA: Diagnosis not present

## 2021-03-25 DIAGNOSIS — D509 Iron deficiency anemia, unspecified: Secondary | ICD-10-CM | POA: Diagnosis not present

## 2021-03-25 DIAGNOSIS — N186 End stage renal disease: Secondary | ICD-10-CM | POA: Diagnosis not present

## 2021-03-25 DIAGNOSIS — Z992 Dependence on renal dialysis: Secondary | ICD-10-CM | POA: Diagnosis not present

## 2021-03-27 DIAGNOSIS — N186 End stage renal disease: Secondary | ICD-10-CM | POA: Diagnosis not present

## 2021-03-27 DIAGNOSIS — N2581 Secondary hyperparathyroidism of renal origin: Secondary | ICD-10-CM | POA: Diagnosis not present

## 2021-03-27 DIAGNOSIS — Z992 Dependence on renal dialysis: Secondary | ICD-10-CM | POA: Diagnosis not present

## 2021-03-27 DIAGNOSIS — D631 Anemia in chronic kidney disease: Secondary | ICD-10-CM | POA: Diagnosis not present

## 2021-03-27 DIAGNOSIS — D509 Iron deficiency anemia, unspecified: Secondary | ICD-10-CM | POA: Diagnosis not present

## 2021-03-29 ENCOUNTER — Ambulatory Visit: Payer: Medicare Other

## 2021-03-29 DIAGNOSIS — I10 Essential (primary) hypertension: Secondary | ICD-10-CM

## 2021-03-29 DIAGNOSIS — E1129 Type 2 diabetes mellitus with other diabetic kidney complication: Secondary | ICD-10-CM

## 2021-03-29 DIAGNOSIS — I5022 Chronic systolic (congestive) heart failure: Secondary | ICD-10-CM

## 2021-03-29 DIAGNOSIS — Z89511 Acquired absence of right leg below knee: Secondary | ICD-10-CM

## 2021-03-29 DIAGNOSIS — Z9181 History of falling: Secondary | ICD-10-CM

## 2021-03-29 DIAGNOSIS — N186 End stage renal disease: Secondary | ICD-10-CM

## 2021-03-29 DIAGNOSIS — I2510 Atherosclerotic heart disease of native coronary artery without angina pectoris: Secondary | ICD-10-CM

## 2021-03-29 NOTE — Patient Instructions (Signed)
Visit Information ? ?Thank you for taking time to visit with me today. Please don't hesitate to contact me if I can be of assistance to you before our next scheduled telephone appointment. ? ?Following are the goals we discussed today:  ?Take all medications as prescribed ?Attend all scheduled provider appointments ?Call pharmacy for medication refills 3-7 days in advance of running out of medications ?Call provider office for new concerns or questions  ?call office if I gain more than 2 pounds in one day or 5 pounds in one week ?weigh myself daily ?track symptoms and what helps feel better or worse ?keep appointment with eye doctor ?check blood sugar at prescribed times: before meals and at bedtime ?fill half of plate with vegetables ?manage portion size ?check blood pressure 3 times per week ?choose a place to take my blood pressure (home, clinic or office, retail store) ?keep a blood pressure log ?take blood pressure log to all doctor appointments ?call doctor for signs and symptoms of high blood pressure ?keep all doctor appointments ?call for medicine refill 2 or 3 days before it runs out ?take all medications exactly as prescribed ?call doctor with any symptoms you believe are related to your medicine ?It is important to avoid accidents which may result in broken bones.  Here are a few ideas on how to make your home safer so you will be less likely to trip or fall. ? ?Use nonskid mats or non slip strips in your shower or tub, on your bathroom floor and around sinks.  If you know that you have spilled water, wipe it up! ?In the bathroom, it is important to have properly installed grab bars on the walls or on the edge of the tub.  Towel racks are NOT strong enough for you to hold onto or to pull on for support. ?Stairs and hallways should have enough light.  Add lamps or night lights if you need ore light. ?It is good to have handrails on both sides of the stairs if possible.  Always fix broken handrails right  away. ?It is important to see the edges of steps.  Paint the edges of outdoor steps white so you can see them better.  Put colored tape on the edge of inside steps. ?Throw-rugs are dangerous because they can slide.  Removing the rugs is the best idea, but if they must stay, add adhesive carpet tape to prevent slipping. ?Do not keep things on stairs or in the halls.  Remove small furniture that blocks the halls as it may cause you to trip.  Keep telephone and electrical cords out of the way where you walk. ?Always were sturdy, rubber-soled shoes for good support.  Never wear just socks, especially on the stairs.  Socks may cause you to slip or fall.  Do not wear full-length housecoats as you can easily trip on the bottom.  ?Place the things you use the most on the shelves that are the easiest to reach.  If you use a stepstool, make sure it is in good condition.  If you feel unsteady, DO NOT climb, ask for help. ?If a health professional advises you to use a cane or walker, do not be ashamed.  These items can keep you from falling and breaking your bones. ?Our next appointment is by telephone on 05/31/21 at 10:45AM ? ?Please call the care guide team at [phone] if you need to cancel or reschedule your appointment.  ? ?If you are experiencing a Mental Health or  Behavioral Health Crisis or need someone to talk to, please call the Suicide and Crisis Lifeline: 988 ?call the [place] National Suicide Prevention Lifeline: (575)327-3001 or TTY: 605-627-9034 TTY 301-789-6088) to talk to a trained counselor ?call 1-800-273-TALK (toll free, 24 hour hotline) ?call 911  ? ?Following is a copy of your full plan of care:  ?Care Plan : [place] of Care  ?Updates made by Dimitri Ped, RN since 03/29/2021 12:00 AM  ?  ? ?Problem: Care Coordination Needs (DM2, CHF, CAD, ESRD, HTN, HLD)   ?Priority: High  ?  ? ?Long-Range Goal: Establish Plan of Care for Care Coordination Needs (DM2, CHF, CAD, ESRD, HTN, HLD)   ?Start  Date: 03/29/2021  ?Expected End Date: 03/29/2022  ?Priority: High  ?Note:   ?Current Barriers:  ?Knowledge Deficits related to plan of care for management of CHF, CAD, HTN, HLD, DMII, ESRD, and frequent falls  ?Care Coordination needs related to ADL IADL limitations ?Chronic Disease Management support and education needs related to CHF, CAD, HTN, HLD, DMII, ESRD, and frequent falls  ?States that his A1C went up in January because he over did it over the holidays but he is now trying to watch his diet closer.  States that dialysis checked his A1C a few weeks ago and it was 7.8%.  States he is wearing his Freestyle Pinnacle and scans 3-4 times a day.  States he takes his sliding scale insulin as directed depending on his CBG.  States his blood sugars can range from 70-80 in the morning and 180-200 later in the day.  States he has had a couple to low reading which he treats with orange juice.  States he did have a fall when he was dizzy after having COVID.  Denies any dizziness now.  States he is recovered from his [place].  States he does have balance problems and uses his cane when walking with his prostheses.  States his B/P runs around 150/60. Denies any chest pains, shortness of breath.  States his defibrillator has been shut off. ? ?RNCM Clinical Goal(s):  ?Patient will verbalize understanding of plan for management of CHF, CAD, HTN, HLD, DMII, ESRD, and frequent falls as evidenced by voiced adherence to plan of care ?verbalize basic understanding of  CHF, CAD, HTN, HLD, DMII, ESRD, and frequent falls disease process and self health management plan as evidenced by voiced understanding and teach back ?take all medications exactly as prescribed and will call provider for medication related questions as evidenced by dispense report and pt verbalization  ?attend all scheduled medical appointments: Dr. Elease Hashimoto 05/17/21 as evidenced by medical records ?demonstrate Improved adherence to prescribed treatment plan for CHF, CAD,  HTN, HLD, DMII, ESRD, and frequent falls as evidenced by voiced understanding and teach back dispense report and pt verbalization   through collaboration with RN Care manager, provider, and care team.  ? ?Interventions: ?1:1 collaboration with primary care provider regarding development and update of comprehensive plan of care as evidenced by provider attestation and co-signature ?Inter-disciplinary care team collaboration (see longitudinal plan of care) ?Evaluation of current treatment plan related to  self management and patient's adherence to plan as established by provider ? ? ?CAD Interventions: (Status:  New goal.) Long Term Goal ?Assessed understanding of CAD diagnosis ?Medications reviewed including medications utilized in CAD treatment plan ?Provided education on importance of blood pressure control in management of CAD ?Provided education on Importance of limiting foods high in cholesterol ?Reviewed Importance of taking all medications as  prescribed ?Reviewed Importance of attending all scheduled provider appointments ? ? ?Heart Failure Interventions:  (Status:  New goal.) Long Term Goal ?Basic overview and discussion of pathophysiology of Heart Failure reviewed ?Provided education on low sodium diet ?Reviewed Heart Failure Action Plan in depth and provided written copy ?Assessed need for readable accurate scales in home ?Discussed importance of daily weight and advised patient to weigh and record daily ? ? Chronic Kidney Disease Interventions:  (Status:  New goal.) Long Term Goal ?Assessed the Patient understanding of chronic kidney disease    ?Evaluation of current treatment plan related to chronic kidney disease self management and patient's adherence to plan as established by provider      ?Provided education to patient re: stroke prevention, s/s of heart attack and stroke    ?Advised patient, providing education and rationale, to monitor blood pressure daily and record, calling PCP for findings  outside established parameters    ?Assessed social determinant of health barriers    ?Provided education on kidney disease progression    ?Last practice recorded BP readings:  ?BP Readings from Last 3 Encoun

## 2021-03-29 NOTE — Chronic Care Management (AMB) (Signed)
? Care Management ?  ? RN Visit Note ? ?03/29/2021 ?Name: Anthony Bullock MRN: [id number] DOB: [birth year] ? ?Subjective: ?Anthony Bullock is a 60 y.o. year old male who is a primary care patient of Burchette, Alinda Sierras, MD. The care management team was consulted for assistance with disease management and care coordination needs.   ? ?Engaged with patient by telephone for initial visit in response to provider referral for case management and/or care coordination services.  ? ?Consent to Services:  ? Mr. Roanhorse was given information about Care Management services today including:  ?Care Management services includes personalized support from designated clinical staff supervised by his physician, including individualized plan of care and coordination with other care providers ?24/7 contact phone numbers for assistance for urgent and routine care needs. ?The patient may stop case management services at any time by phone call to the office staff. ? ?Patient agreed to services and consent obtained.  ? ?Assessment: Review of patient past medical history, allergies, medications, health status, including review of consultants reports, laboratory and other test data, was performed as part of comprehensive evaluation and provision of chronic care management services.  ? ?SDOH (Social Determinants of Health) assessments and interventions performed:  ?SDOH Interventions   ? ?Flowsheet Row Most Recent Value  ?SDOH Interventions   ?Food Insecurity Interventions Intervention Not Indicated  ?Financial Strain Interventions Intervention Not Indicated  ?Housing Interventions Intervention Not Indicated  ?Stress Interventions Intervention Not Indicated  ?Transportation Interventions Intervention Not Indicated  ? ?  ?  ? ?Care Plan ? ?Allergies  ?Allergen Reactions  ? Epoetin Alfa Other (See Comments)  ?  unknown  ? Ferumoxytol Other (See Comments)  ?  unknown  ? Morphine Sulfate Rash and Other (See Comments)  ?  Itches all over, red spots   ? ? ?Outpatient Encounter Medications as of 03/29/2021  ?Medication Sig  ? acetaminophen (TYLENOL) 325 MG tablet Take 1-2 tablets (325-650 mg total) by mouth every 4 (four) hours as needed for mild pain.  ? albuterol (VENTOLIN HFA) 108 (90 Base) MCG/ACT inhaler Inhale 2 puffs into the lungs every 4 (four) hours as needed for wheezing or shortness of breath.  ? aspirin EC 81 MG tablet Take 81 mg by mouth daily.   ? atorvastatin (LIPITOR) 80 MG tablet Take 1 tablet (80 mg total) by mouth at bedtime.  ? azelastine (ASTELIN) 0.1 % nasal spray Place 2 sprays into both nostrils 3 (three) times daily as needed for rhinitis. Use in each nostril as directed  ? benzonatate (TESSALON PERLES) 100 MG capsule 1-2 capsules up to twice daily as needed for cough  ? CALCITRIOL PO Take by mouth 3 (three) times a week. ONLY PROVIDED AT DIALYSIS  ? carvedilol (COREG) 12.5 MG tablet Take 12.5 mg by mouth 2 (two) times daily.  ? cinacalcet (SENSIPAR) 30 MG tablet Take 1 tablet (30 mg total) by mouth Every Tuesday,Thursday,and Saturday with dialysis.  ? Continuous Blood Gluc Sensor (FREESTYLE LIBRE 14 DAY SENSOR) MISC Use as directed  ? fenofibrate 160 MG tablet Take 1 tablet (160 mg total) by mouth daily.  ? fexofenadine (ALLEGRA) 180 MG tablet Take 180 mg by mouth daily.  ? furosemide (LASIX) 40 MG tablet Take 3 tablets (120 mg total) by mouth daily.  ? gabapentin (NEURONTIN) 300 MG capsule Take 2 capsules (600 mg total) by mouth 2 (two) times daily. (Patient taking differently: Take 600 mg by mouth. Take 1 capsule every morning and 2 capsules every evening)  ?  glucose blood test strip Check 1 time daily. E11.9 One Touch Ultra Blue Test Strips  ? HUMALOG KWIKPEN 200 UNIT/ML KwikPen USE TWICE DAILY WITH LUNCH AND SUPPER PER SLIDING SCALE (MAXIMUM DAILY DOSE OF 28 UNITS) . APPOINTMENT REQUIRED FOR FURTHER REFILLS  ? insulin glargine (LANTUS SOLOSTAR) 100 UNIT/ML Solostar Pen Inject 12 units subcutaneously at bed time. (Patient taking  differently: 16 Units. Inject 16 units subcutaneously at bed time.)  ? Insulin Pen Needle (BD PEN NEEDLE NANO U/F) 32G X 4 MM MISC USE 1 PEN NEEDLE SUBCUTANEOUSLY WITH INSULIN 4 TIMES DAILY  ? lanthanum (FOSRENOL) 500 MG chewable tablet Chew 500-1,000 mg by mouth See admin instructions. Take 1000 mg with meals three time a day and 500 mg with snacks  ? midodrine (PROAMATINE) 10 MG tablet Take 10 mg by mouth 3 (three) times a week.  ? multivitamin (RENA-VIT) TABS tablet Take 1 tablet by mouth daily.  ? Olopatadine HCl 0.2 % SOLN Place 1 drop into both eyes daily as needed (for allergies).   ? ondansetron (ZOFRAN ODT) 8 MG disintegrating tablet Take 1 tablet (8 mg total) by mouth every 8 (eight) hours as needed for nausea or vomiting.  ? ?No facility-administered encounter medications on file as of 03/29/2021.  ? ? ?Patient Active Problem List  ? Diagnosis Date Noted  ? Malnutrition of moderate degree 11/18/2019  ? Below-knee amputation of left lower extremity ([place]) 11/16/2019  ? Abscess of left foot 11/13/2019  ? Dehiscence of amputation stump (HCC)   ? History of complete ray amputation of fifth toe of left foot ([place]) 10/09/2019  ? Acute pulmonary edema (HCC)   ? Osteomyelitis of fifth toe of left foot ([place])   ? Febrile illness 08/17/2019  ? SIRS (systemic inflammatory response syndrome) ([place]) 08/17/2019  ? Thrombocytopenia ([place]) 08/17/2019  ? Pressure injury of back, stage 2 ([place]) 08/17/2019  ? Hypertensive emergency 07/10/2019  ? Onychomycosis 08/16/2016  ? Osteopathy in diseases classified elsewhere, unspecified site 05/14/2016  ? S/P bilateral below knee amputation ([place]) 04/26/2016  ? Bilateral carotid bruits 03/30/2016  ? Diabetes mellitus with complication ([place])   ? Anemia due to chronic kidney disease   ? ESRD on dialysis ([place]) 11/30/2015  ? Acute respiratory failure with hypoxia ([place]) 11/29/2015  ? Dependence on renal dialysis ([place]) 08/15/2015  ? Presence of automatic (implantable) cardiac defibrillator  03/12/2015  ? At high risk for falls 09/29/2014  ? Peripheral vascular disease ([place]) 09/29/2014  ? Osteoarthritis of both knees 01/07/2014  ? Osteoarthritis of multiple joints 10/06/2013  ? NICM (nonischemic cardiomyopathy) ([place]) 06/27/2013  ? CAD (coronary artery disease) 03/13/2013  ? Abnormal nuclear stress test 01/27/2013  ? Chronic systolic congestive heart failure ([place]) 01/27/2013  ? COPD (chronic obstructive pulmonary disease) ([place]) 12/27/2012  ? Diabetic peripheral neuropathy ([place]) 02/10/2012  ? Urethral stricture 10/06/2010  ? URINARY CALCULUS 09/28/2009  ? Adjustment disorder with depressed mood 08/21/2009  ? Poorly controlled type II diabetes mellitus with renal complication ([place]) 82/70/7867  ? Hyperlipidemia 04/14/2008  ? Essential hypertension 04/14/2008  ? Allergic rhinitis 04/14/2008  ? ? ?Conditions to be addressed/monitored: CHF, CAD, HTN, HLD, DMII, ESRD, and frequent falls ? ?Care Plan : RN Care Manager Plan of Care  ?Updates made by Dimitri Ped, RN since 03/29/2021 12:00 AM  ?  ? ?Problem: Care Coordination Needs (DM2, CHF, CAD, ESRD, HTN, HLD)   ?Priority: High  ?  ? ?Long-Range Goal: Establish Plan of Care for Care Coordination Needs (DM2, CHF, CAD, ESRD,  HTN, HLD)   ?Start Date: 03/29/2021  ?Expected End Date: 03/29/2022  ?Priority: High  ?Note:   ?Current Barriers:  ?Knowledge Deficits related to plan of care for management of CHF, CAD, HTN, HLD, DMII, ESRD, and frequent falls  ?Care Coordination needs related to ADL IADL limitations ?Chronic Disease Management support and education needs related to CHF, CAD, HTN, HLD, DMII, ESRD, and frequent falls  ?States that his A1C went up in January because he over did it over the holidays but he is now trying to watch his diet closer.  States that dialysis checked his A1C a few weeks ago and it was 7.8%.  States he is wearing his Freestyle [place] and scans 3-4 times a day.  States he takes his sliding scale insulin as directed depending on his CBG.   States his blood sugars can range from 70-80 in the morning and 180-200 later in the day.  States he has had a couple to low reading which he treats with orange juice.  States he did have a fall when he was dizzy af

## 2021-03-30 DIAGNOSIS — D509 Iron deficiency anemia, unspecified: Secondary | ICD-10-CM | POA: Diagnosis not present

## 2021-03-30 DIAGNOSIS — Z992 Dependence on renal dialysis: Secondary | ICD-10-CM | POA: Diagnosis not present

## 2021-03-30 DIAGNOSIS — D631 Anemia in chronic kidney disease: Secondary | ICD-10-CM | POA: Diagnosis not present

## 2021-03-30 DIAGNOSIS — N186 End stage renal disease: Secondary | ICD-10-CM | POA: Diagnosis not present

## 2021-03-30 DIAGNOSIS — N2581 Secondary hyperparathyroidism of renal origin: Secondary | ICD-10-CM | POA: Diagnosis not present

## 2021-04-01 DIAGNOSIS — D509 Iron deficiency anemia, unspecified: Secondary | ICD-10-CM | POA: Diagnosis not present

## 2021-04-01 DIAGNOSIS — N2581 Secondary hyperparathyroidism of renal origin: Secondary | ICD-10-CM | POA: Diagnosis not present

## 2021-04-01 DIAGNOSIS — N186 End stage renal disease: Secondary | ICD-10-CM | POA: Diagnosis not present

## 2021-04-01 DIAGNOSIS — D631 Anemia in chronic kidney disease: Secondary | ICD-10-CM | POA: Diagnosis not present

## 2021-04-01 DIAGNOSIS — Z992 Dependence on renal dialysis: Secondary | ICD-10-CM | POA: Diagnosis not present

## 2021-04-03 DIAGNOSIS — D631 Anemia in chronic kidney disease: Secondary | ICD-10-CM | POA: Diagnosis not present

## 2021-04-03 DIAGNOSIS — D509 Iron deficiency anemia, unspecified: Secondary | ICD-10-CM | POA: Diagnosis not present

## 2021-04-03 DIAGNOSIS — N186 End stage renal disease: Secondary | ICD-10-CM | POA: Diagnosis not present

## 2021-04-03 DIAGNOSIS — Z992 Dependence on renal dialysis: Secondary | ICD-10-CM | POA: Diagnosis not present

## 2021-04-03 DIAGNOSIS — N2581 Secondary hyperparathyroidism of renal origin: Secondary | ICD-10-CM | POA: Diagnosis not present

## 2021-04-06 DIAGNOSIS — Z794 Long term (current) use of insulin: Secondary | ICD-10-CM | POA: Diagnosis not present

## 2021-04-06 DIAGNOSIS — E119 Type 2 diabetes mellitus without complications: Secondary | ICD-10-CM | POA: Diagnosis not present

## 2021-04-06 DIAGNOSIS — D631 Anemia in chronic kidney disease: Secondary | ICD-10-CM | POA: Diagnosis not present

## 2021-04-06 DIAGNOSIS — N186 End stage renal disease: Secondary | ICD-10-CM | POA: Diagnosis not present

## 2021-04-06 DIAGNOSIS — N2581 Secondary hyperparathyroidism of renal origin: Secondary | ICD-10-CM | POA: Diagnosis not present

## 2021-04-06 DIAGNOSIS — D509 Iron deficiency anemia, unspecified: Secondary | ICD-10-CM | POA: Diagnosis not present

## 2021-04-06 DIAGNOSIS — Z992 Dependence on renal dialysis: Secondary | ICD-10-CM | POA: Diagnosis not present

## 2021-04-08 DIAGNOSIS — N186 End stage renal disease: Secondary | ICD-10-CM | POA: Diagnosis not present

## 2021-04-08 DIAGNOSIS — N2581 Secondary hyperparathyroidism of renal origin: Secondary | ICD-10-CM | POA: Diagnosis not present

## 2021-04-08 DIAGNOSIS — D509 Iron deficiency anemia, unspecified: Secondary | ICD-10-CM | POA: Diagnosis not present

## 2021-04-08 DIAGNOSIS — Z992 Dependence on renal dialysis: Secondary | ICD-10-CM | POA: Diagnosis not present

## 2021-04-08 DIAGNOSIS — D631 Anemia in chronic kidney disease: Secondary | ICD-10-CM | POA: Diagnosis not present

## 2021-04-09 DIAGNOSIS — N186 End stage renal disease: Secondary | ICD-10-CM | POA: Diagnosis not present

## 2021-04-09 DIAGNOSIS — Z992 Dependence on renal dialysis: Secondary | ICD-10-CM | POA: Diagnosis not present

## 2021-04-10 DIAGNOSIS — D509 Iron deficiency anemia, unspecified: Secondary | ICD-10-CM | POA: Diagnosis not present

## 2021-04-10 DIAGNOSIS — N250 Renal osteodystrophy: Secondary | ICD-10-CM | POA: Diagnosis not present

## 2021-04-10 DIAGNOSIS — E559 Vitamin D deficiency, unspecified: Secondary | ICD-10-CM | POA: Diagnosis not present

## 2021-04-10 DIAGNOSIS — N2581 Secondary hyperparathyroidism of renal origin: Secondary | ICD-10-CM | POA: Diagnosis not present

## 2021-04-10 DIAGNOSIS — Z992 Dependence on renal dialysis: Secondary | ICD-10-CM | POA: Diagnosis not present

## 2021-04-10 DIAGNOSIS — D631 Anemia in chronic kidney disease: Secondary | ICD-10-CM | POA: Diagnosis not present

## 2021-04-10 DIAGNOSIS — N186 End stage renal disease: Secondary | ICD-10-CM | POA: Diagnosis not present

## 2021-04-13 DIAGNOSIS — N250 Renal osteodystrophy: Secondary | ICD-10-CM | POA: Diagnosis not present

## 2021-04-13 DIAGNOSIS — N2581 Secondary hyperparathyroidism of renal origin: Secondary | ICD-10-CM | POA: Diagnosis not present

## 2021-04-13 DIAGNOSIS — N186 End stage renal disease: Secondary | ICD-10-CM | POA: Diagnosis not present

## 2021-04-13 DIAGNOSIS — Z992 Dependence on renal dialysis: Secondary | ICD-10-CM | POA: Diagnosis not present

## 2021-04-13 DIAGNOSIS — E559 Vitamin D deficiency, unspecified: Secondary | ICD-10-CM | POA: Diagnosis not present

## 2021-04-13 DIAGNOSIS — D631 Anemia in chronic kidney disease: Secondary | ICD-10-CM | POA: Diagnosis not present

## 2021-04-15 DIAGNOSIS — Z992 Dependence on renal dialysis: Secondary | ICD-10-CM | POA: Diagnosis not present

## 2021-04-15 DIAGNOSIS — N250 Renal osteodystrophy: Secondary | ICD-10-CM | POA: Diagnosis not present

## 2021-04-15 DIAGNOSIS — N2581 Secondary hyperparathyroidism of renal origin: Secondary | ICD-10-CM | POA: Diagnosis not present

## 2021-04-15 DIAGNOSIS — D631 Anemia in chronic kidney disease: Secondary | ICD-10-CM | POA: Diagnosis not present

## 2021-04-15 DIAGNOSIS — N186 End stage renal disease: Secondary | ICD-10-CM | POA: Diagnosis not present

## 2021-04-15 DIAGNOSIS — E559 Vitamin D deficiency, unspecified: Secondary | ICD-10-CM | POA: Diagnosis not present

## 2021-04-17 DIAGNOSIS — N250 Renal osteodystrophy: Secondary | ICD-10-CM | POA: Diagnosis not present

## 2021-04-17 DIAGNOSIS — E559 Vitamin D deficiency, unspecified: Secondary | ICD-10-CM | POA: Diagnosis not present

## 2021-04-17 DIAGNOSIS — Z992 Dependence on renal dialysis: Secondary | ICD-10-CM | POA: Diagnosis not present

## 2021-04-17 DIAGNOSIS — D631 Anemia in chronic kidney disease: Secondary | ICD-10-CM | POA: Diagnosis not present

## 2021-04-17 DIAGNOSIS — N186 End stage renal disease: Secondary | ICD-10-CM | POA: Diagnosis not present

## 2021-04-17 DIAGNOSIS — N2581 Secondary hyperparathyroidism of renal origin: Secondary | ICD-10-CM | POA: Diagnosis not present

## 2021-04-20 DIAGNOSIS — N2581 Secondary hyperparathyroidism of renal origin: Secondary | ICD-10-CM | POA: Diagnosis not present

## 2021-04-20 DIAGNOSIS — N250 Renal osteodystrophy: Secondary | ICD-10-CM | POA: Diagnosis not present

## 2021-04-20 DIAGNOSIS — E559 Vitamin D deficiency, unspecified: Secondary | ICD-10-CM | POA: Diagnosis not present

## 2021-04-20 DIAGNOSIS — D631 Anemia in chronic kidney disease: Secondary | ICD-10-CM | POA: Diagnosis not present

## 2021-04-20 DIAGNOSIS — N186 End stage renal disease: Secondary | ICD-10-CM | POA: Diagnosis not present

## 2021-04-20 DIAGNOSIS — Z992 Dependence on renal dialysis: Secondary | ICD-10-CM | POA: Diagnosis not present

## 2021-04-22 DIAGNOSIS — Z992 Dependence on renal dialysis: Secondary | ICD-10-CM | POA: Diagnosis not present

## 2021-04-22 DIAGNOSIS — E559 Vitamin D deficiency, unspecified: Secondary | ICD-10-CM | POA: Diagnosis not present

## 2021-04-22 DIAGNOSIS — N250 Renal osteodystrophy: Secondary | ICD-10-CM | POA: Diagnosis not present

## 2021-04-22 DIAGNOSIS — N186 End stage renal disease: Secondary | ICD-10-CM | POA: Diagnosis not present

## 2021-04-22 DIAGNOSIS — N2581 Secondary hyperparathyroidism of renal origin: Secondary | ICD-10-CM | POA: Diagnosis not present

## 2021-04-22 DIAGNOSIS — D631 Anemia in chronic kidney disease: Secondary | ICD-10-CM | POA: Diagnosis not present

## 2021-04-23 ENCOUNTER — Telehealth: Payer: Self-pay | Admitting: Family Medicine

## 2021-04-23 MED ORDER — CARVEDILOL 12.5 MG PO TABS: 12.5000 mg | ORAL_TABLET | Freq: Two times a day (BID) | ORAL | 1 refills | Status: DC

## 2021-04-23 NOTE — Telephone Encounter (Signed)
Pt is calling and needs a refill on carvedilol (COREG) 12.5 MG tablet  ?[place], [address] Phone:  [phone]  ?Fax:  [phone]  ?  ? ?

## 2021-04-24 DIAGNOSIS — Z20822 Contact with and (suspected) exposure to covid-19: Secondary | ICD-10-CM | POA: Diagnosis not present

## 2021-04-24 DIAGNOSIS — N186 End stage renal disease: Secondary | ICD-10-CM | POA: Diagnosis not present

## 2021-04-24 DIAGNOSIS — D631 Anemia in chronic kidney disease: Secondary | ICD-10-CM | POA: Diagnosis not present

## 2021-04-24 DIAGNOSIS — N250 Renal osteodystrophy: Secondary | ICD-10-CM | POA: Diagnosis not present

## 2021-04-24 DIAGNOSIS — N2581 Secondary hyperparathyroidism of renal origin: Secondary | ICD-10-CM | POA: Diagnosis not present

## 2021-04-24 DIAGNOSIS — E559 Vitamin D deficiency, unspecified: Secondary | ICD-10-CM | POA: Diagnosis not present

## 2021-04-24 DIAGNOSIS — Z992 Dependence on renal dialysis: Secondary | ICD-10-CM | POA: Diagnosis not present

## 2021-04-26 DIAGNOSIS — N250 Renal osteodystrophy: Secondary | ICD-10-CM | POA: Diagnosis not present

## 2021-04-26 DIAGNOSIS — N2581 Secondary hyperparathyroidism of renal origin: Secondary | ICD-10-CM | POA: Diagnosis not present

## 2021-04-26 DIAGNOSIS — Z992 Dependence on renal dialysis: Secondary | ICD-10-CM | POA: Diagnosis not present

## 2021-04-26 DIAGNOSIS — E559 Vitamin D deficiency, unspecified: Secondary | ICD-10-CM | POA: Diagnosis not present

## 2021-04-26 DIAGNOSIS — D631 Anemia in chronic kidney disease: Secondary | ICD-10-CM | POA: Diagnosis not present

## 2021-04-26 DIAGNOSIS — N186 End stage renal disease: Secondary | ICD-10-CM | POA: Diagnosis not present

## 2021-04-28 DIAGNOSIS — Z20822 Contact with and (suspected) exposure to covid-19: Secondary | ICD-10-CM | POA: Diagnosis not present

## 2021-04-29 DIAGNOSIS — N2581 Secondary hyperparathyroidism of renal origin: Secondary | ICD-10-CM | POA: Diagnosis not present

## 2021-04-29 DIAGNOSIS — Z992 Dependence on renal dialysis: Secondary | ICD-10-CM | POA: Diagnosis not present

## 2021-04-29 DIAGNOSIS — N250 Renal osteodystrophy: Secondary | ICD-10-CM | POA: Diagnosis not present

## 2021-04-29 DIAGNOSIS — E559 Vitamin D deficiency, unspecified: Secondary | ICD-10-CM | POA: Diagnosis not present

## 2021-04-29 DIAGNOSIS — D631 Anemia in chronic kidney disease: Secondary | ICD-10-CM | POA: Diagnosis not present

## 2021-04-29 DIAGNOSIS — N186 End stage renal disease: Secondary | ICD-10-CM | POA: Diagnosis not present

## 2021-05-01 DIAGNOSIS — N186 End stage renal disease: Secondary | ICD-10-CM | POA: Diagnosis not present

## 2021-05-01 DIAGNOSIS — N2581 Secondary hyperparathyroidism of renal origin: Secondary | ICD-10-CM | POA: Diagnosis not present

## 2021-05-01 DIAGNOSIS — Z992 Dependence on renal dialysis: Secondary | ICD-10-CM | POA: Diagnosis not present

## 2021-05-01 DIAGNOSIS — D631 Anemia in chronic kidney disease: Secondary | ICD-10-CM | POA: Diagnosis not present

## 2021-05-01 DIAGNOSIS — E559 Vitamin D deficiency, unspecified: Secondary | ICD-10-CM | POA: Diagnosis not present

## 2021-05-01 DIAGNOSIS — N250 Renal osteodystrophy: Secondary | ICD-10-CM | POA: Diagnosis not present

## 2021-05-03 IMAGING — DX DG CHEST 2V
2 series · 2 of 2 positions shown · non-contrast
Comparison: 08/18/2019, 07/01/2019

CLINICAL DATA: Weakness

EXAM:
CHEST - 2 VIEW

[chest pa]
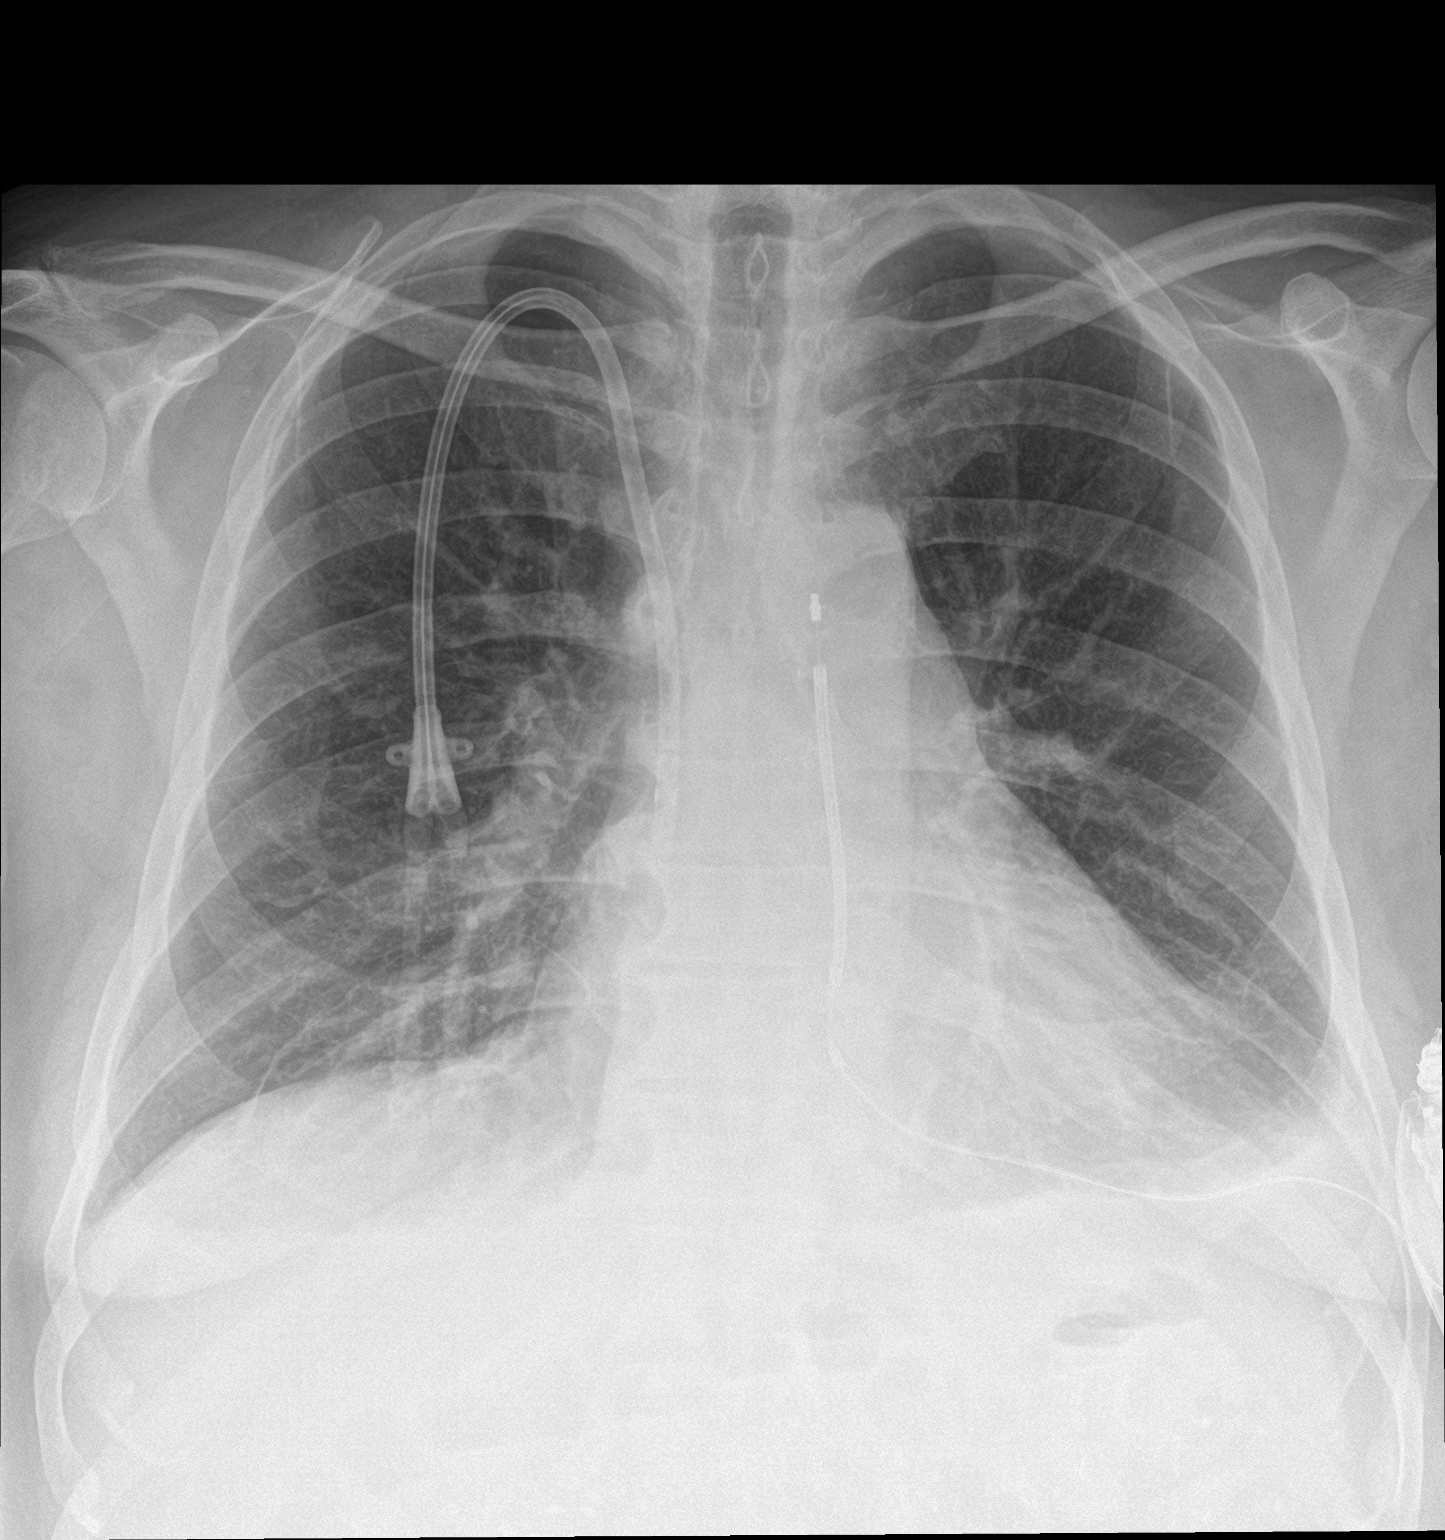

[chest lat]
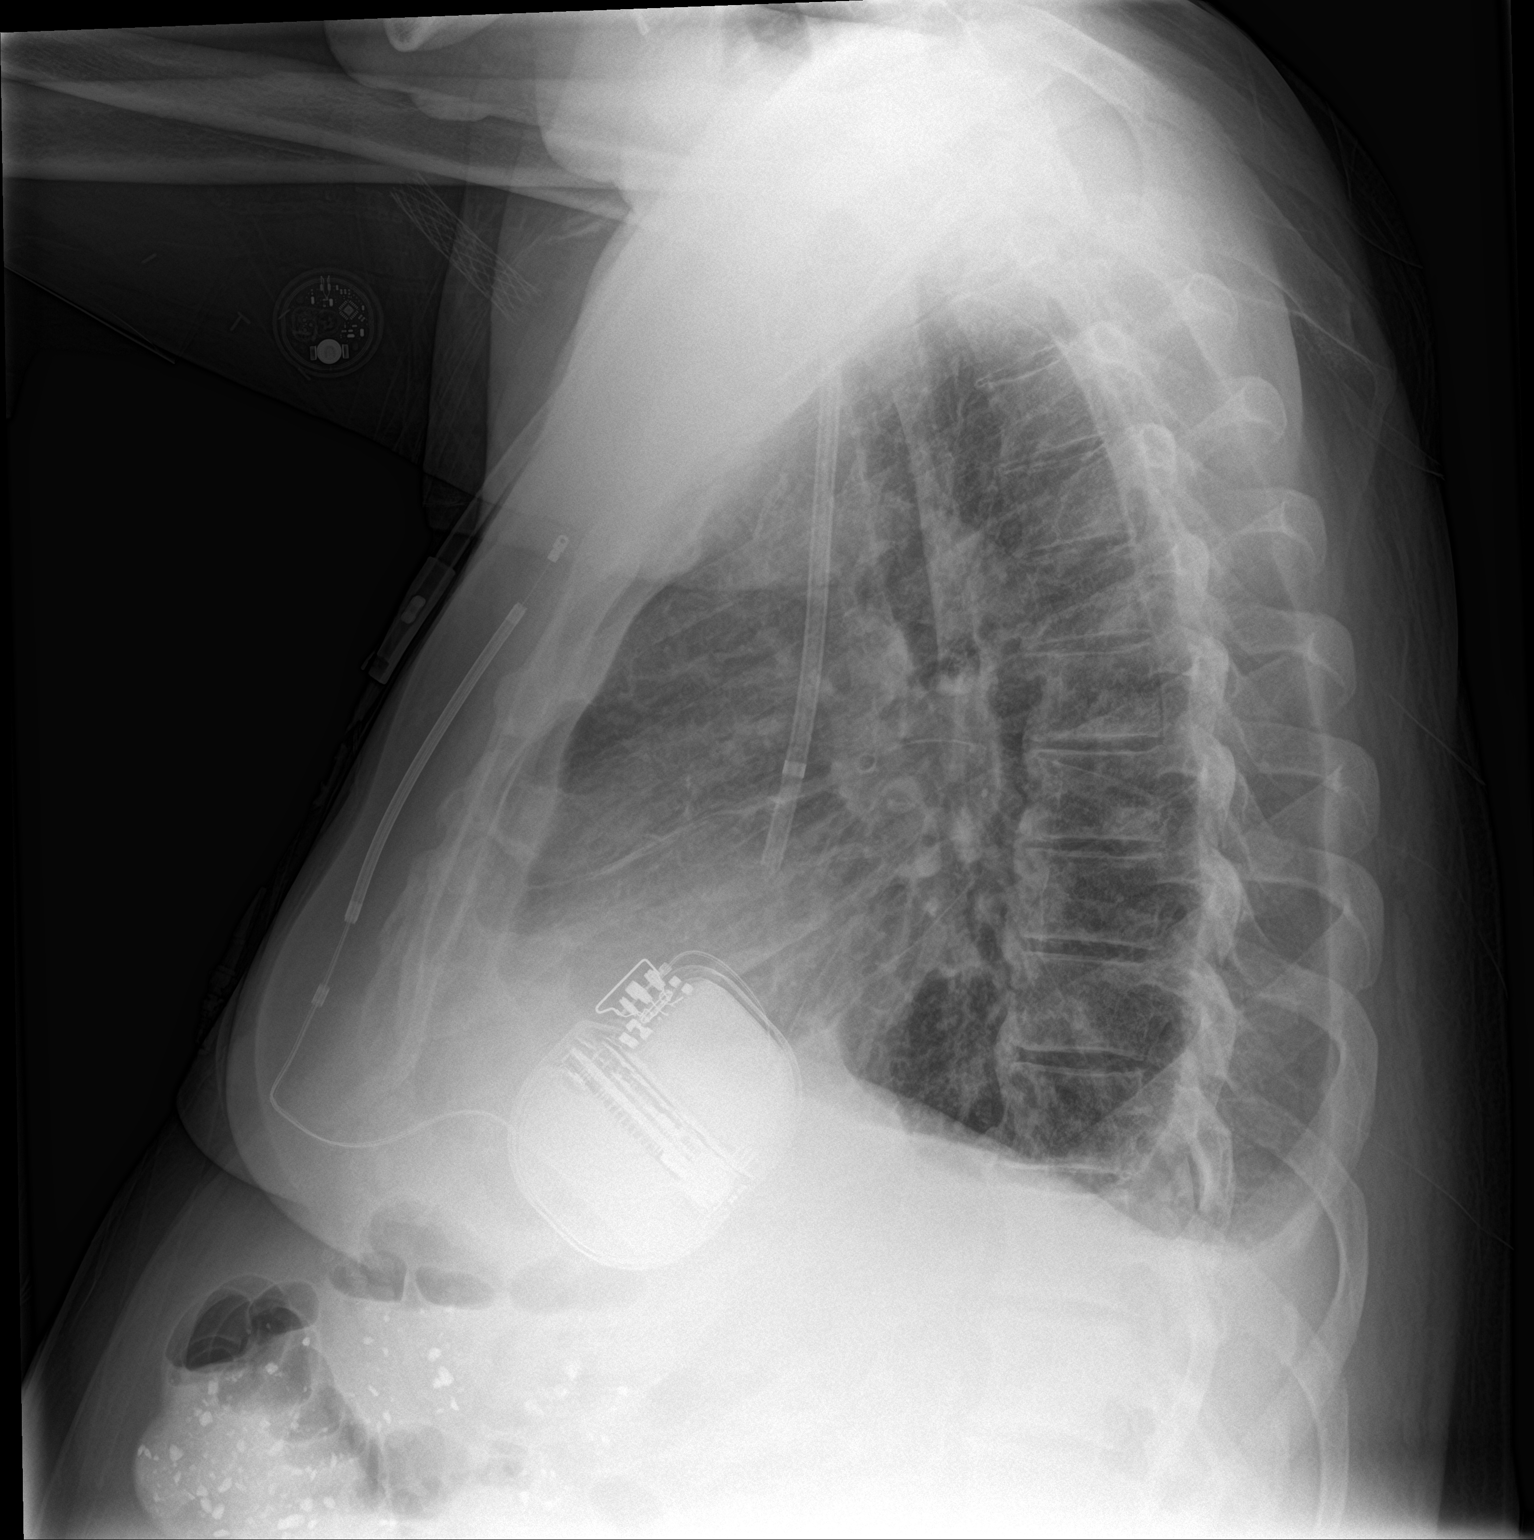

[2 of 2 positions shown; findings below may reference images not displayed]

FINDINGS: Small bilateral pleural effusions, left greater than right.
Right-sided central venous catheter tip over the distal SVC. Similar
subcutaneous deferred relate ower lead placement. Borderline
cardiomegaly. No consolidation or pneumothorax.
IMPRESSION: Small bilateral pleural effusions, left greater than right.

## 2021-05-04 DIAGNOSIS — N250 Renal osteodystrophy: Secondary | ICD-10-CM | POA: Diagnosis not present

## 2021-05-04 DIAGNOSIS — N186 End stage renal disease: Secondary | ICD-10-CM | POA: Diagnosis not present

## 2021-05-04 DIAGNOSIS — Z992 Dependence on renal dialysis: Secondary | ICD-10-CM | POA: Diagnosis not present

## 2021-05-04 DIAGNOSIS — D631 Anemia in chronic kidney disease: Secondary | ICD-10-CM | POA: Diagnosis not present

## 2021-05-04 DIAGNOSIS — E559 Vitamin D deficiency, unspecified: Secondary | ICD-10-CM | POA: Diagnosis not present

## 2021-05-04 DIAGNOSIS — N2581 Secondary hyperparathyroidism of renal origin: Secondary | ICD-10-CM | POA: Diagnosis not present

## 2021-05-05 DIAGNOSIS — Z20822 Contact with and (suspected) exposure to covid-19: Secondary | ICD-10-CM | POA: Diagnosis not present

## 2021-05-06 DIAGNOSIS — Z992 Dependence on renal dialysis: Secondary | ICD-10-CM | POA: Diagnosis not present

## 2021-05-06 DIAGNOSIS — N250 Renal osteodystrophy: Secondary | ICD-10-CM | POA: Diagnosis not present

## 2021-05-06 DIAGNOSIS — N186 End stage renal disease: Secondary | ICD-10-CM | POA: Diagnosis not present

## 2021-05-06 DIAGNOSIS — D631 Anemia in chronic kidney disease: Secondary | ICD-10-CM | POA: Diagnosis not present

## 2021-05-06 DIAGNOSIS — E559 Vitamin D deficiency, unspecified: Secondary | ICD-10-CM | POA: Diagnosis not present

## 2021-05-06 DIAGNOSIS — N2581 Secondary hyperparathyroidism of renal origin: Secondary | ICD-10-CM | POA: Diagnosis not present

## 2021-05-07 IMAGING — DX DG CHEST 1V PORT
1 series · 1 of 1 positions shown · non-contrast
Comparison: 08/30/2019

CLINICAL DATA: Dyspnea

EXAM:
PORTABLE CHEST 1 VIEW

[chest ap grid]
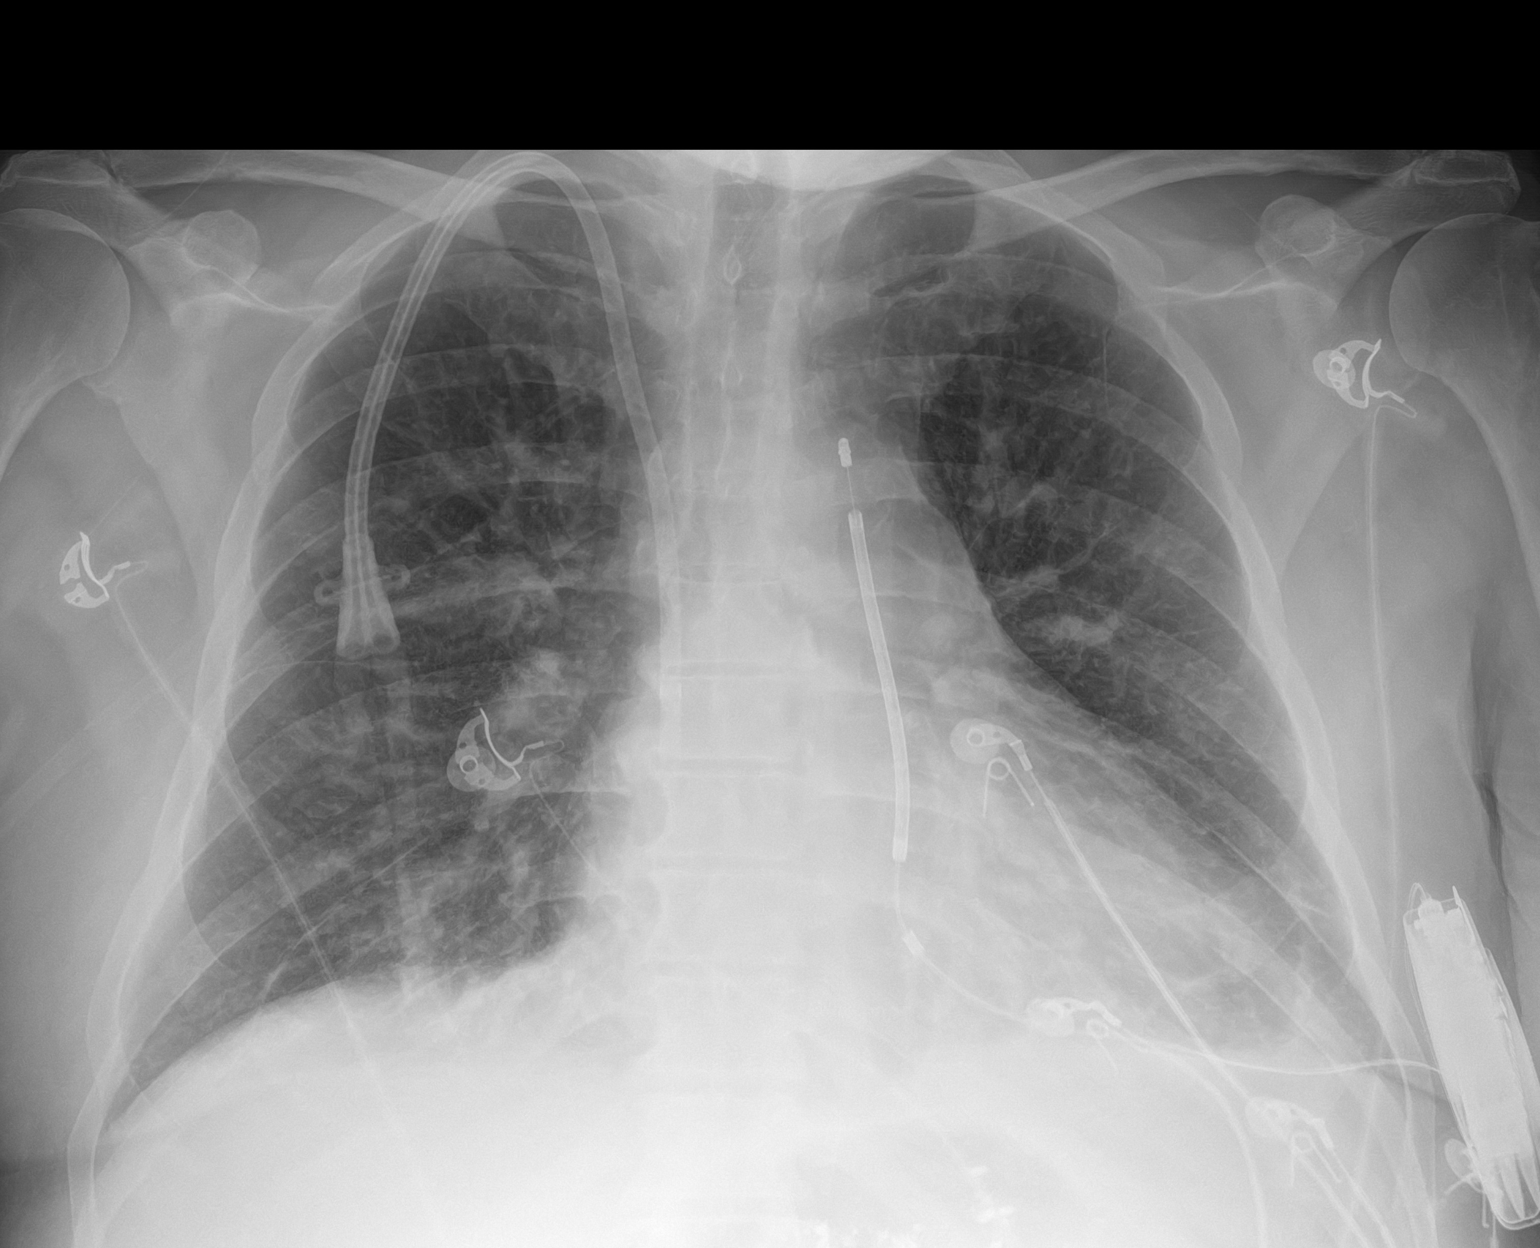

[1 of 1 positions shown; findings below may reference images not displayed]

FINDINGS: Lungs are clear. No pneumothorax or pleural effusion. Right internal
jugular hemodialysis catheter with its tip at the superior
cavoatrial junction and epicardial defibrillator are unchanged. Mild
cardiomegaly is stable. Pulmonary vascularity is normal. No acute
bone abnormality.
IMPRESSION: No acute cardiopulmonary process.

## 2021-05-08 DIAGNOSIS — E559 Vitamin D deficiency, unspecified: Secondary | ICD-10-CM | POA: Diagnosis not present

## 2021-05-08 DIAGNOSIS — Z992 Dependence on renal dialysis: Secondary | ICD-10-CM | POA: Diagnosis not present

## 2021-05-08 DIAGNOSIS — N186 End stage renal disease: Secondary | ICD-10-CM | POA: Diagnosis not present

## 2021-05-08 DIAGNOSIS — N2581 Secondary hyperparathyroidism of renal origin: Secondary | ICD-10-CM | POA: Diagnosis not present

## 2021-05-08 DIAGNOSIS — N250 Renal osteodystrophy: Secondary | ICD-10-CM | POA: Diagnosis not present

## 2021-05-08 DIAGNOSIS — D631 Anemia in chronic kidney disease: Secondary | ICD-10-CM | POA: Diagnosis not present

## 2021-05-09 DIAGNOSIS — Z992 Dependence on renal dialysis: Secondary | ICD-10-CM | POA: Diagnosis not present

## 2021-05-09 DIAGNOSIS — N186 End stage renal disease: Secondary | ICD-10-CM | POA: Diagnosis not present

## 2021-05-11 DIAGNOSIS — Z992 Dependence on renal dialysis: Secondary | ICD-10-CM | POA: Diagnosis not present

## 2021-05-11 DIAGNOSIS — N186 End stage renal disease: Secondary | ICD-10-CM | POA: Diagnosis not present

## 2021-05-11 DIAGNOSIS — N250 Renal osteodystrophy: Secondary | ICD-10-CM | POA: Diagnosis not present

## 2021-05-11 DIAGNOSIS — D631 Anemia in chronic kidney disease: Secondary | ICD-10-CM | POA: Diagnosis not present

## 2021-05-11 DIAGNOSIS — E559 Vitamin D deficiency, unspecified: Secondary | ICD-10-CM | POA: Diagnosis not present

## 2021-05-11 DIAGNOSIS — N2581 Secondary hyperparathyroidism of renal origin: Secondary | ICD-10-CM | POA: Diagnosis not present

## 2021-05-11 DIAGNOSIS — D509 Iron deficiency anemia, unspecified: Secondary | ICD-10-CM | POA: Diagnosis not present

## 2021-05-13 DIAGNOSIS — D631 Anemia in chronic kidney disease: Secondary | ICD-10-CM | POA: Diagnosis not present

## 2021-05-13 DIAGNOSIS — N250 Renal osteodystrophy: Secondary | ICD-10-CM | POA: Diagnosis not present

## 2021-05-13 DIAGNOSIS — Z992 Dependence on renal dialysis: Secondary | ICD-10-CM | POA: Diagnosis not present

## 2021-05-13 DIAGNOSIS — D509 Iron deficiency anemia, unspecified: Secondary | ICD-10-CM | POA: Diagnosis not present

## 2021-05-13 DIAGNOSIS — N186 End stage renal disease: Secondary | ICD-10-CM | POA: Diagnosis not present

## 2021-05-13 DIAGNOSIS — N2581 Secondary hyperparathyroidism of renal origin: Secondary | ICD-10-CM | POA: Diagnosis not present

## 2021-05-15 DIAGNOSIS — Z992 Dependence on renal dialysis: Secondary | ICD-10-CM | POA: Diagnosis not present

## 2021-05-15 DIAGNOSIS — D509 Iron deficiency anemia, unspecified: Secondary | ICD-10-CM | POA: Diagnosis not present

## 2021-05-15 DIAGNOSIS — N2581 Secondary hyperparathyroidism of renal origin: Secondary | ICD-10-CM | POA: Diagnosis not present

## 2021-05-15 DIAGNOSIS — D631 Anemia in chronic kidney disease: Secondary | ICD-10-CM | POA: Diagnosis not present

## 2021-05-15 DIAGNOSIS — N250 Renal osteodystrophy: Secondary | ICD-10-CM | POA: Diagnosis not present

## 2021-05-15 DIAGNOSIS — N186 End stage renal disease: Secondary | ICD-10-CM | POA: Diagnosis not present

## 2021-05-17 ENCOUNTER — Encounter: Payer: Self-pay | Admitting: Family Medicine

## 2021-05-17 ENCOUNTER — Ambulatory Visit (INDEPENDENT_AMBULATORY_CARE_PROVIDER_SITE_OTHER): Payer: Medicare Other | Admitting: Family Medicine

## 2021-05-17 VITALS — BP 124/64 | HR 80 | Temp 97.5°F | Ht 70.0 in | Wt 198.0 lb

## 2021-05-17 DIAGNOSIS — E1129 Type 2 diabetes mellitus with other diabetic kidney complication: Secondary | ICD-10-CM | POA: Diagnosis not present

## 2021-05-17 DIAGNOSIS — I2510 Atherosclerotic heart disease of native coronary artery without angina pectoris: Secondary | ICD-10-CM

## 2021-05-17 DIAGNOSIS — I10 Essential (primary) hypertension: Secondary | ICD-10-CM

## 2021-05-17 DIAGNOSIS — E785 Hyperlipidemia, unspecified: Secondary | ICD-10-CM

## 2021-05-17 DIAGNOSIS — E1165 Type 2 diabetes mellitus with hyperglycemia: Secondary | ICD-10-CM

## 2021-05-17 DIAGNOSIS — Z20822 Contact with and (suspected) exposure to covid-19: Secondary | ICD-10-CM | POA: Diagnosis not present

## 2021-05-17 LAB — LIPID PANEL
Cholesterol: 114 mg/dL (ref 0–200)
HDL: 36.4 mg/dL — ABNORMAL LOW (ref >39.00)
LDL Cholesterol: 52 mg/dL (ref 0–99)
NonHDL: 77.91
Total CHOL/HDL Ratio: 3
Triglycerides: 128 mg/dL (ref 0.0–149.0)
VLDL: 25.6 mg/dL (ref 0.0–40.0)

## 2021-05-17 LAB — POCT GLYCOSYLATED HEMOGLOBIN (HGB A1C): Hemoglobin A1C: 7.7 % — AB (ref 4.0–5.6)

## 2021-05-17 LAB — HEPATIC FUNCTION PANEL
ALT: 20 U/L (ref 0–53)
AST: 22 U/L (ref 0–37)
Albumin: 3.8 g/dL (ref 3.5–5.2)
Alkaline Phosphatase: 77 U/L (ref 39–117)
Bilirubin, Direct: 0.1 mg/dL (ref 0.0–0.3)
Total Bilirubin: 0.4 mg/dL (ref 0.2–1.2)
Total Protein: 7.3 g/dL (ref 6.0–8.3)

## 2021-05-17 NOTE — Progress Notes (Signed)
? ?Established Patient Office Visit ? ?Subjective   ?Patient ID: Anthony Bullock, male    DOB: [birth year]  Age: 61 y.o. MRN: [id number] ? ?Chief Complaint  ?Patient presents with  ? Follow-up  ? ? ?HPI ? ? ?Anthony Bullock is here for medical follow-up.  He has end-stage renal disease on hemodialysis and followed closely by nephrology.  He has some problems with blood pressure instability with his dialysis and is on midodrine days a week that he does dialysis.  Overall doing fairly well.  Regarding his diabetes he is on Lantus 14 units once daily and sliding scale for Humalog.  He recently had to bump up his Humalog slightly.  Fasting blood sugars usually run 118.  Currently not using any Humalog with breakfast.  Prelunch blood sugars usually up around 180-200.  Denies any recent hypoglycemic episodes. ? ?He has hyperlipidemia treated with high-dose statin with Lipitor 80 mg daily.  Denies any myalgia.  Carvedilol 12.5 mg twice daily.  Denies any recent chest pains.  Last lipids were last June.  He has follow-up with cardiology later this week. ? ?Past Medical History:  ?Diagnosis Date  ? AICD (automatic cardioverter/defibrillator) present   ? [employer]  ? Allergic rhinitis   ? Anemia   ? Arthritis   ? Chronic systolic heart failure ([place])   ? a. ECHO (12/2012) EF 25-30%, HK entireanteroseptal myocardium //  b.  EF 25%, diffuse HK, grade 1 diastolic dysfunction, MAC, mild LAE, normal RVSF, trivial pericardial effusion  ? COPD (chronic obstructive pulmonary disease) ([place])   ? Diabetes mellitus type II   ? Diabetic nephropathy ([place])   ? Diabetic neuropathy ([place])   ? ESRD on hemodialysis ([place])   ? started HD June 2017, goes to [facility] HD unit, Dr Hinda Lenis  ? History of cardiac catheterization   ? a.Myoview 1/15:  There is significant left ventricular dysfunction. There may be slight scar at the apex. There is no significant ischemia. LV Ejection Fraction: 27%  //  b. RHC/LHC (1/15) with mean RA 6, PA 47/22 mean 33, mean  PCWP 20, PVR 2.5 WU, CI 2.5; 80% dLAD stenosis, 70% diffuse large D.    ? History of kidney stones   ? Hyperlipidemia   ? Hypertension   ? Kidney stones   ? NICM (nonischemic cardiomyopathy) ([place])   ? Primarily nonischemic.  Echo (12/14) with EF 25-30%.  Echo (3/15) with EF 25%, mild to moderately dilated LV, normal RV size and systolic function.    ? Osteomyelitis ([place])   ? left fifth ray  ? Pneumonia   ? Urethral stricture   ? Wears glasses   ? ?Past Surgical History:  ?Procedure Laterality Date  ? ABDOMINAL AORTOGRAM W/LOWER EXTREMITY N/A 03/30/2016  ? Procedure: Abdominal Aortogram w/Lower Extremity;  Surgeon: Angelia Mould, MD;  Location: [facility];  Service: Cardiovascular;  Laterality: N/A;  ? AMPUTATION Right 04/26/2016  ? Procedure: Right Below Knee Amputation;  Surgeon: Newt Minion, MD;  Location: [facility];  Service: Orthopedics;  Laterality: Right;  ? AMPUTATION Left 08/21/2019  ? Procedure: LEFT FOOT 5TH RAY AMPUTATION;  Surgeon: Newt Minion, MD;  Location: [facility];  Service: Orthopedics;  Laterality: Left;  ? AMPUTATION Left 11/13/2019  ? Procedure: LEFT BELOW KNEE AMPUTATION;  Surgeon: Newt Minion, MD;  Location: [facility];  Service: Orthopedics;  Laterality: Left;  ? AV FISTULA PLACEMENT Right 09/08/2015  ? Procedure: INSERTION OF 4-11m x 45cm  ARTERIOVENOUS (AV) GORE-TEX  GRAFT RIGHT UPPER  ARM;  Surgeon: Angelia Mould, MD;  Location: [facility];  Service: Vascular;  Laterality: Right;  ? AV FISTULA PLACEMENT Left 01/14/2016  ? Procedure: CREATION OF LEFT UPPER ARM ARTERIOVENOUS FISTULA;  Surgeon: Angelia Mould, MD;  Location: [facility];  Service: Vascular;  Laterality: Left;  ? BASCILIC VEIN TRANSPOSITION Right 08/22/2014  ? Procedure: RIGHT UPPER ARM BASCILIC VEIN TRANSPOSITION;  Surgeon: Angelia Mould, MD;  [facility];  Service: Vascular;  Laterality: Right;  ? BELOW KNEE LEG AMPUTATION Right 04/26/2016  ? CARDIAC CATHETERIZATION    ? CARDIAC DEFIBRILLATOR PLACEMENT   06/27/2013  ? Sub Q       BY DR Caryl Comes  ? CATARACT EXTRACTION W/PHACO Right 08/06/2018  ? Procedure: CATARACT EXTRACTION PHACO AND INTRAOCULAR LENS PLACEMENT (IOC);  Surgeon: Baruch Goldmann, MD;  Location[facility];  Service: Ophthalmology;  Laterality: Right;  CDE: 4.06  ? CATARACT EXTRACTION W/PHACO Left 08/20/2018  ? Procedure: CATARACT EXTRACTION PHACO AND INTRAOCULAR LENS PLACEMENT (IOC);  Surgeon: Baruch Goldmann, MD;  Location: [facility];  Service: Ophthalmology;  Laterality: Left;  CDE: 6.76  ? COLONOSCOPY WITH PROPOFOL N/A 07/22/2015  ? Procedure: COLONOSCOPY WITH PROPOFOL;  Surgeon: Doran Stabler, MD;  Location: [facility];  Service: Gastroenterology;  Laterality: N/A;  ? FEMORAL-POPLITEAL BYPASS GRAFT Right 03/31/2016  ? Procedure: BYPASS GRAFT FEMORAL-POPLITEAL ARTERY USING RIGHT GREATER SAPHENOUS NONREVERSED VEIN;  Surgeon: Angelia Mould, MD;  Location: [facility];  Service: Vascular;  Laterality: Right;  ? HERNIA REPAIR    ? I & D EXTREMITY Right 03/31/2016  ? Procedure: IRRIGATION AND DEBRIDEMENT FOOT;  Surgeon: Angelia Mould, MD;  Location: [facility];  Service: Vascular;  Laterality: Right;  ? IMPLANTABLE CARDIOVERTER DEFIBRILLATOR IMPLANT N/A 06/27/2013  ? Procedure: SUB Q ICD;  Surgeon: Deboraha Sprang, MD;  Location[facility];  Service: Cardiovascular;  Laterality: N/A;  ? INTRAOPERATIVE ARTERIOGRAM Right 03/31/2016  ? Procedure: INTRA OPERATIVE ARTERIOGRAM;  Surgeon: Angelia Mould, MD;  Location: [facility];  Service: Vascular;  Laterality: Right;  ? IR GENERIC HISTORICAL Right 11/30/2015  ? IR THROMBECTOMY AV FISTULA W/THROMBOLYSIS/PTA INC/SHUNT/IMG RIGHT 11/30/2015 Aletta Edouard, MD [facility]  ? IR GENERIC HISTORICAL  11/30/2015  ? IR US GUIDE VASC ACCESS RIGHT 11/30/2015 Aletta Edouard, MD [facility]  ? IR GENERIC HISTORICAL Right 12/15/2015  ? IR THROMBECTOMY AV FISTULA W/THROMBOLYSIS/PTA/STENT INC/SHUNT/IMG RT 12/15/2015 Arne Cleveland, MD [facility]  ? IR GENERIC HISTORICAL   12/15/2015  ? IR US GUIDE VASC ACCESS RIGHT 12/15/2015 Arne Cleveland, MD [facility]  ? IR GENERIC HISTORICAL  12/28/2015  ? IR FLUORO GUIDE CV LINE RIGHT 12/28/2015 Marybelle Killings, MD [facility]  ? IR GENERIC HISTORICAL  12/28/2015  ? IR US GUIDE VASC ACCESS RIGHT 12/28/2015 Marybelle Killings, MD [facility]  ? LEFT A ND RIGHT HEART CATH  01/30/2013  ? DR Sung Amabile  ? LEFT AND RIGHT HEART CATHETERIZATION WITH CORONARY ANGIOGRAM N/A 01/30/2013  ? Procedure: LEFT AND RIGHT HEART CATHETERIZATION WITH CORONARY ANGIOGRAM;  Surgeon: Jolaine Artist, MD;  Location: [facility];  Service: Cardiovascular;  Laterality: N/A;  ? PERIPHERAL VASCULAR CATHETERIZATION Right 01/26/2015  ? Procedure: A/V Fistulagram;  Surgeon: Angelia Mould, MD;  Location: [facility];  Service: Cardiovascular;  Laterality: Right;  ? reapea urethral surgery for recurrent obstruction  2011  ? TOTAL KNEE ARTHROPLASTY Right 2007  ? VEIN HARVEST Right 03/31/2016  ? Procedure: RIGHT GREATER SAPHENOUS VEIN HARVEST;  Surgeon: Angelia Mould, MD;  Location: [facility];  Service: Vascular;  Laterality: Right;  ? ? reports that he quit smoking about 11 years ago. His smoking use included cigarettes. He has a 64.00 pack-year smoking history. He has never used smokeless tobacco. He reports that he does not drink alcohol and does not use drugs. ?family history includes Alcohol abuse in his father; Bladder Cancer in his mother; Diabetes in his maternal grandmother; Heart Problems in his maternal grandmother; Heart disease in his maternal grandfather; Melanoma in his father; Prostate cancer in his maternal grandfather; Stroke in his maternal grandmother. ?Allergies  ?Allergen Reactions  ? Epoetin Alfa Other (See Comments)  ?  unknown  ? Ferumoxytol Other (See Comments)  ?  unknown  ? Morphine Sulfate Rash and Other (See Comments)  ?  Itches all over, red spots  ? ? ?Review of Systems  ?Constitutional:  Negative for chills and fever.  ?Respiratory:   Negative for cough and shortness of breath.   ?Cardiovascular:  Negative for chest pain.  ?Gastrointestinal:  Negative for abdominal pain.  ? ?  ?Objective:  ?  ? ?BP 124/64 (BP Location: Left Arm, Patient Pos

## 2021-05-17 NOTE — Patient Instructions (Signed)
A1C improved to 7.7% ? ?Consider adding Humalog sliding scale to breakfast.  ? ?Let's set up 3 month follow up.  ?

## 2021-05-18 DIAGNOSIS — Z992 Dependence on renal dialysis: Secondary | ICD-10-CM | POA: Diagnosis not present

## 2021-05-18 DIAGNOSIS — D631 Anemia in chronic kidney disease: Secondary | ICD-10-CM | POA: Diagnosis not present

## 2021-05-18 DIAGNOSIS — D509 Iron deficiency anemia, unspecified: Secondary | ICD-10-CM | POA: Diagnosis not present

## 2021-05-18 DIAGNOSIS — N250 Renal osteodystrophy: Secondary | ICD-10-CM | POA: Diagnosis not present

## 2021-05-18 DIAGNOSIS — N2581 Secondary hyperparathyroidism of renal origin: Secondary | ICD-10-CM | POA: Diagnosis not present

## 2021-05-18 DIAGNOSIS — N186 End stage renal disease: Secondary | ICD-10-CM | POA: Diagnosis not present

## 2021-05-20 ENCOUNTER — Ambulatory Visit (INDEPENDENT_AMBULATORY_CARE_PROVIDER_SITE_OTHER): Payer: Medicare Other | Admitting: Cardiology

## 2021-05-20 ENCOUNTER — Encounter: Payer: Self-pay | Admitting: Cardiology

## 2021-05-20 VITALS — BP 140/80 | HR 72 | Ht 70.0 in | Wt 193.8 lb

## 2021-05-20 DIAGNOSIS — I10 Essential (primary) hypertension: Secondary | ICD-10-CM | POA: Diagnosis not present

## 2021-05-20 DIAGNOSIS — I2510 Atherosclerotic heart disease of native coronary artery without angina pectoris: Secondary | ICD-10-CM | POA: Diagnosis not present

## 2021-05-20 DIAGNOSIS — N2581 Secondary hyperparathyroidism of renal origin: Secondary | ICD-10-CM | POA: Diagnosis not present

## 2021-05-20 DIAGNOSIS — Z8679 Personal history of other diseases of the circulatory system: Secondary | ICD-10-CM

## 2021-05-20 DIAGNOSIS — D631 Anemia in chronic kidney disease: Secondary | ICD-10-CM | POA: Diagnosis not present

## 2021-05-20 DIAGNOSIS — I6523 Occlusion and stenosis of bilateral carotid arteries: Secondary | ICD-10-CM | POA: Diagnosis not present

## 2021-05-20 DIAGNOSIS — Z992 Dependence on renal dialysis: Secondary | ICD-10-CM | POA: Diagnosis not present

## 2021-05-20 DIAGNOSIS — D509 Iron deficiency anemia, unspecified: Secondary | ICD-10-CM | POA: Diagnosis not present

## 2021-05-20 DIAGNOSIS — N250 Renal osteodystrophy: Secondary | ICD-10-CM | POA: Diagnosis not present

## 2021-05-20 DIAGNOSIS — N186 End stage renal disease: Secondary | ICD-10-CM | POA: Diagnosis not present

## 2021-05-20 DIAGNOSIS — E782 Mixed hyperlipidemia: Secondary | ICD-10-CM | POA: Diagnosis not present

## 2021-05-20 NOTE — Patient Instructions (Signed)
Medication Instructions:  Your physician recommends that you continue on your current medications as directed. Please refer to the Current Medication list given to you today.  Labwork: none  Testing/Procedures: Your physician has requested that you have a carotid duplex. This test is an ultrasound of the carotid arteries in your neck. It looks at blood flow through these arteries that supply the brain with blood. Allow one hour for this exam. There are no restrictions or special instructions.  Follow-Up: Your physician recommends that you schedule a follow-up appointment in: 6 months  Any Other Special Instructions Will Be Listed Below (If Applicable).  If you need a refill on your cardiac medications before your next appointment, please call your pharmacy. 

## 2021-05-20 NOTE — Progress Notes (Signed)
? ? ? ?Clinical Summary ?Anthony Bullock is a 61 y.o.male seen today for follow up of the following medical problems.  ?  ?  ?1. Chronic systolic HF/NICM ?- echo in 2015 LVEF 25%. echo 08/2015 LVEF has improved to 50-55%. ?- has had some of his CHF meds decreased due to low bp's on HD.  ?- has ICD, since LVEF recovered did not have gen change and now inactive.  ?  ? 08/2016 echo LVEF 55%. , grade I diasotlic function.  ? 08/2019 echo LVEF 50%, grade Idd ? - no SOB, no fluid retention. Fluid managed by HD ?- compliant with meds ? ? ? ?2. ESRD ?- takes midodrine 17m before HD. Some low bp's during last hour but overall tolerates.  ?  ?  ?  ?3. HTN ?- scaled back on bp meds due to low bp's on HD ?- currently just on coreg.  ?  ?4. CAD ?- cath 2015 with diffuse nonobstructive disease, worst lesion 80% small distal LAD.  ?  ?- no chest pains ? ? ?5. Hyperlipidemia ?- Jan 2019 TC 103 TG 136 HDL 38 LDL 38 ?04/2019 TG 187 HDL 37 LDL 84 ? 06/2020 TC 101 HDL 37 TG 140 LDL 42 ?  ?05/2021 TC 114 TG 128 HDL 36 LDL 52 ?  ?  ?6. DM2 ?- followed by pcp ?  ?7. Carotid stenosis ?- in 2019 mild bilateral disease ?  ?Past Medical History:  ?Diagnosis Date  ? AICD (automatic cardioverter/defibrillator) present   ? [employer]  ? Allergic rhinitis   ? Anemia   ? Arthritis   ? Chronic systolic heart failure ([place]   ? a. ECHO (12/2012) EF 25-30%, HK entireanteroseptal myocardium //  b.  EF 25%, diffuse HK, grade 1 diastolic dysfunction, MAC, mild LAE, normal RVSF, trivial pericardial effusion  ? COPD (chronic obstructive pulmonary disease) ([place]   ? Diabetes mellitus type II   ? Diabetic nephropathy ([place]   ? Diabetic neuropathy ([place]   ? ESRD on hemodialysis ([place]   ? [facility] 2017[facility] Dr BHinda Lenis ? History of cardiac catheterization   ? a.Myoview 1/15:  There is significant left ventricular dysfunction. There may be slight scar at the apex. There is no significant ischemia. LV Ejection Fraction: 27%  //  b.  RHC/LHC (1/15) with mean RA 6, PA 47/22 mean 33, mean PCWP 20, PVR 2.5 WU, CI 2.5; 80% dLAD stenosis, 70% diffuse large D.    ? History of kidney stones   ? Hyperlipidemia   ? Hypertension   ? Kidney stones   ? NICM (nonischemic cardiomyopathy) ([place]   ? Primarily nonischemic.  Echo (12/14) with EF 25-30%.  Echo (3/15) with EF 25%, mild to moderately dilated LV, normal RV size and systolic function.    ? Osteomyelitis ([place]   ? left fifth ray  ? Pneumonia   ? Urethral stricture   ? Wears glasses   ? ? ? ?Allergies  ?Allergen Reactions  ? Epoetin Alfa Other (See Comments)  ?  unknown  ? Ferumoxytol Other (See Comments)  ?  unknown  ? Morphine Sulfate Rash and Other (See Comments)  ?  Itches all over, red spots  ? ? ? ?Current Outpatient Medications  ?Medication Sig Dispense Refill  ? acetaminophen (TYLENOL) 325 MG tablet Take 1-2 tablets (325-650 mg total) by mouth every 4 (four) hours as needed for mild pain.    ? albuterol (VENTOLIN HFA) 108 (  90 Base) MCG/ACT inhaler Inhale 2 puffs into the lungs every 4 (four) hours as needed for wheezing or shortness of breath. 1 each 1  ? aspirin EC 81 MG tablet Take 81 mg by mouth daily.     ? atorvastatin (LIPITOR) 80 MG tablet Take 1 tablet (80 mg total) by mouth at bedtime. 90 tablet 3  ? azelastine (ASTELIN) 0.1 % nasal spray Place 2 sprays into both nostrils 3 (three) times daily as needed for rhinitis. Use in each nostril as directed 30 mL 12  ? benzonatate (TESSALON PERLES) 100 MG capsule 1-2 capsules up to twice daily as needed for cough 30 capsule 0  ? CALCITRIOL PO Take by mouth 3 (three) times a week. ONLY PROVIDED AT DIALYSIS    ? carvedilol (COREG) 12.5 MG tablet Take 1 tablet (12.5 mg total) by mouth 2 (two) times daily. 180 tablet 1  ? cinacalcet (SENSIPAR) 30 MG tablet Take 1 tablet (30 mg total) by mouth Every Tuesday,Thursday,and Saturday with dialysis. 60 tablet   ? Continuous Blood Gluc Sensor (FREESTYLE LIBRE 14 DAY SENSOR) MISC Use as directed 4 each 11  ?  fenofibrate 160 MG tablet Take 1 tablet (160 mg total) by mouth daily. 90 tablet 3  ? fexofenadine (ALLEGRA) 180 MG tablet Take 180 mg by mouth daily.    ? furosemide (LASIX) 40 MG tablet Take 3 tablets (120 mg total) by mouth daily. 270 tablet 3  ? gabapentin (NEURONTIN) 300 MG capsule Take 2 capsules (600 mg total) by mouth 2 (two) times daily. (Patient taking differently: Take 600 mg by mouth. Take 1 capsule every morning and 2 capsules every evening) 360 capsule 1  ? glucose blood test strip Check 1 time daily. E11.9 One Touch Ultra Blue Test Strips 100 each 3  ? HUMALOG KWIKPEN 200 UNIT/ML KwikPen USE TWICE DAILY WITH LUNCH AND SUPPER PER SLIDING SCALE (MAXIMUM DAILY DOSE OF 28 UNITS) . APPOINTMENT REQUIRED FOR FURTHER REFILLS 6 mL 0  ? insulin glargine (LANTUS SOLOSTAR) 100 UNIT/ML Solostar Pen Inject 12 units subcutaneously at bed time. (Patient taking differently: 16 Units. Inject 16 units subcutaneously at bed time.) 15 mL 2  ? Insulin Pen Needle (BD PEN NEEDLE NANO U/F) 32G X 4 MM MISC USE 1 PEN NEEDLE SUBCUTANEOUSLY WITH INSULIN 4 TIMES DAILY 400 each 0  ? lanthanum (FOSRENOL) 500 MG chewable tablet Chew 500-1,000 mg by mouth See admin instructions. Take 1000 mg with meals three time a day and 500 mg with snacks    ? midodrine (PROAMATINE) 10 MG tablet Take 10 mg by mouth 3 (three) times a week.    ? multivitamin (RENA-VIT) TABS tablet Take 1 tablet by mouth daily. 90 tablet 3  ? Olopatadine HCl 0.2 % SOLN Place 1 drop into both eyes daily as needed (for allergies).     ? ondansetron (ZOFRAN ODT) 8 MG disintegrating tablet Take 1 tablet (8 mg total) by mouth every 8 (eight) hours as needed for nausea or vomiting. 15 tablet 0  ? ?No current facility-administered medications for this visit.  ? ? ? ?Past Surgical History:  ?Procedure Laterality Date  ? ABDOMINAL AORTOGRAM W/LOWER EXTREMITY N/A 03/30/2016  ? Procedure: Abdominal Aortogram w/Lower Extremity;  Surgeon: Angelia Mould, MD;  Location: [place] [facility];  Service: Cardiovascular;  Laterality: N/A;  ? AMPUTATION Right 04/26/2016  ? Procedure: Right Below Knee Amputation;  Surgeon: Newt Minion, MD;  Location: [place];  Service: Orthopedics;  Laterality: Right;  ?  AMPUTATION Left 08/21/2019  ? Procedure: LEFT FOOT 5TH RAY AMPUTATION;  Surgeon: Newt Minion, MD;  Location: [place];  Service: Orthopedics;  Laterality: Left;  ? AMPUTATION Left 11/13/2019  ? Procedure: LEFT BELOW KNEE AMPUTATION;  Surgeon: Newt Minion, MD;  Location: [place];  Service: Orthopedics;  Laterality: Left;  ? AV FISTULA PLACEMENT Right 09/08/2015  ? Procedure: INSERTION OF 4-27m x 45cm  ARTERIOVENOUS (AV) GORE-TEX GRAFT RIGHT UPPER  ARM;  Surgeon: CAngelia Mould MD;  [place]  Service: Vascular;  Laterality: Right;  ? AV FISTULA PLACEMENT Left 01/14/2016  ? Procedure: CREATION OF LEFT UPPER ARM ARTERIOVENOUS FISTULA;  Surgeon: CAngelia Mould MD;  [place]  Service: Vascular;  Laterality: Left;  ? BASCILIC VEIN TRANSPOSITION Right 08/22/2014  ? Procedure: RIGHT UPPER ARM BASCILIC VEIN TRANSPOSITION;  Surgeon: CAngelia Mould MD;  [place]  Service: Vascular;  Laterality: Right;  ? BELOW KNEE LEG AMPUTATION Right 04/26/2016  ? CARDIAC CATHETERIZATION    ? CARDIAC DEFIBRILLATOR PLACEMENT  06/27/2013  ? Sub Q       BY DR KCaryl Comes ? CATARACT EXTRACTION W/PHACO Right 08/06/2018  ? Procedure: CATARACT EXTRACTION PHACO AND INTRAOCULAR LENS PLACEMENT (IOC);  Surgeon: WBaruch Goldmann MD;  [facility];  Service: Ophthalmology;  Laterality: Right;  CDE: 4.06  ? CATARACT EXTRACTION W/PHACO Left 08/20/2018  ? Procedure: CATARACT EXTRACTION PHACO AND INTRAOCULAR LENS PLACEMENT (IOC);  Surgeon: WBaruch Goldmann MD;  [facility];  Service: Ophthalmology;  Laterality: Left;  CDE: 6.76  ? COLONOSCOPY WITH PROPOFOL N/A 07/22/2015  ? Procedure: COLONOSCOPY WITH PROPOFOL;  Surgeon: HDoran Stabler MD;  [facility];  Service: Gastroenterology;   Laterality: N/A;  ? FEMORAL-POPLITEAL BYPASS GRAFT Right 03/31/2016  ? Procedure: BYPASS GRAFT FEMORAL-POPLITEAL ARTERY USING RIGHT GREATER SAPHENOUS NONREVERSED VEIN;  Surgeon: CAngelia Mould

## 2021-05-22 DIAGNOSIS — D509 Iron deficiency anemia, unspecified: Secondary | ICD-10-CM | POA: Diagnosis not present

## 2021-05-22 DIAGNOSIS — Z992 Dependence on renal dialysis: Secondary | ICD-10-CM | POA: Diagnosis not present

## 2021-05-22 DIAGNOSIS — N2581 Secondary hyperparathyroidism of renal origin: Secondary | ICD-10-CM | POA: Diagnosis not present

## 2021-05-22 DIAGNOSIS — D631 Anemia in chronic kidney disease: Secondary | ICD-10-CM | POA: Diagnosis not present

## 2021-05-22 DIAGNOSIS — N250 Renal osteodystrophy: Secondary | ICD-10-CM | POA: Diagnosis not present

## 2021-05-22 DIAGNOSIS — N186 End stage renal disease: Secondary | ICD-10-CM | POA: Diagnosis not present

## 2021-05-25 DIAGNOSIS — D509 Iron deficiency anemia, unspecified: Secondary | ICD-10-CM | POA: Diagnosis not present

## 2021-05-25 DIAGNOSIS — Z992 Dependence on renal dialysis: Secondary | ICD-10-CM | POA: Diagnosis not present

## 2021-05-25 DIAGNOSIS — N2581 Secondary hyperparathyroidism of renal origin: Secondary | ICD-10-CM | POA: Diagnosis not present

## 2021-05-25 DIAGNOSIS — N186 End stage renal disease: Secondary | ICD-10-CM | POA: Diagnosis not present

## 2021-05-25 DIAGNOSIS — D631 Anemia in chronic kidney disease: Secondary | ICD-10-CM | POA: Diagnosis not present

## 2021-05-25 DIAGNOSIS — N250 Renal osteodystrophy: Secondary | ICD-10-CM | POA: Diagnosis not present

## 2021-05-27 DIAGNOSIS — N2581 Secondary hyperparathyroidism of renal origin: Secondary | ICD-10-CM | POA: Diagnosis not present

## 2021-05-27 DIAGNOSIS — Z992 Dependence on renal dialysis: Secondary | ICD-10-CM | POA: Diagnosis not present

## 2021-05-27 DIAGNOSIS — D631 Anemia in chronic kidney disease: Secondary | ICD-10-CM | POA: Diagnosis not present

## 2021-05-27 DIAGNOSIS — D509 Iron deficiency anemia, unspecified: Secondary | ICD-10-CM | POA: Diagnosis not present

## 2021-05-27 DIAGNOSIS — N250 Renal osteodystrophy: Secondary | ICD-10-CM | POA: Diagnosis not present

## 2021-05-27 DIAGNOSIS — N186 End stage renal disease: Secondary | ICD-10-CM | POA: Diagnosis not present

## 2021-05-29 DIAGNOSIS — N250 Renal osteodystrophy: Secondary | ICD-10-CM | POA: Diagnosis not present

## 2021-05-29 DIAGNOSIS — Z992 Dependence on renal dialysis: Secondary | ICD-10-CM | POA: Diagnosis not present

## 2021-05-29 DIAGNOSIS — N2581 Secondary hyperparathyroidism of renal origin: Secondary | ICD-10-CM | POA: Diagnosis not present

## 2021-05-29 DIAGNOSIS — D509 Iron deficiency anemia, unspecified: Secondary | ICD-10-CM | POA: Diagnosis not present

## 2021-05-29 DIAGNOSIS — N186 End stage renal disease: Secondary | ICD-10-CM | POA: Diagnosis not present

## 2021-05-29 DIAGNOSIS — D631 Anemia in chronic kidney disease: Secondary | ICD-10-CM | POA: Diagnosis not present

## 2021-05-31 ENCOUNTER — Ambulatory Visit: Payer: Medicare Other

## 2021-05-31 DIAGNOSIS — I5022 Chronic systolic (congestive) heart failure: Secondary | ICD-10-CM

## 2021-05-31 DIAGNOSIS — N186 End stage renal disease: Secondary | ICD-10-CM

## 2021-05-31 DIAGNOSIS — I10 Essential (primary) hypertension: Secondary | ICD-10-CM

## 2021-05-31 DIAGNOSIS — E1129 Type 2 diabetes mellitus with other diabetic kidney complication: Secondary | ICD-10-CM

## 2021-05-31 DIAGNOSIS — I2510 Atherosclerotic heart disease of native coronary artery without angina pectoris: Secondary | ICD-10-CM

## 2021-05-31 NOTE — Chronic Care Management (AMB) (Signed)
Care Management    RN Visit Note  05/31/2021 Name: Anthony Bullock MRN: [id number] DOB: 12/29/1960  Subjective: Anthony Bullock is a 61 y.o. year old male who is a primary care patient of Burchette, Alinda Sierras, MD. The care management team was consulted for assistance with disease management and care coordination needs.    Engaged with patient by telephone for follow up visit in response to provider referral for case management and/or care coordination services.   Consent to Services:   [name] was given information about Care Management services today including:  Care Management services includes personalized support from designated clinical staff supervised by his physician, including individualized plan of care and coordination with other care providers 24/7 contact phone numbers for assistance for urgent and routine care needs. The patient may stop case management services at any time by phone call to the office staff.  Patient agreed to services and consent obtained.   Assessment: Review of patient past medical history, allergies, medications, health status, including review of consultants reports, laboratory and other test data, was performed as part of comprehensive evaluation and provision of chronic care management services.   SDOH (Social Determinants of Health) assessments and interventions performed:    Care Plan  Allergies  Allergen Reactions   Epoetin Alfa Other (See Comments)    unknown   Ferumoxytol Other (See Comments)    unknown   Morphine Sulfate Rash and Other (See Comments)    Itches all over, red spots    Outpatient Encounter Medications as of 05/31/2021  Medication Sig   acetaminophen (TYLENOL) 325 MG tablet Take 1-2 tablets (325-650 mg total) by mouth every 4 (four) hours as needed for mild pain.   albuterol (VENTOLIN HFA) 108 (90 Base) MCG/ACT inhaler Inhale 2 puffs into the lungs every 4 (four) hours as needed for wheezing or shortness of breath.   aspirin  EC 81 MG tablet Take 81 mg by mouth daily.    atorvastatin (LIPITOR) 80 MG tablet Take 1 tablet (80 mg total) by mouth at bedtime.   azelastine (ASTELIN) 0.1 % nasal spray Place 2 sprays into both nostrils 3 (three) times daily as needed for rhinitis. Use in each nostril as directed   benzonatate (TESSALON PERLES) 100 MG capsule 1-2 capsules up to twice daily as needed for cough   CALCITRIOL PO Take by mouth 3 (three) times a week. ONLY PROVIDED AT DIALYSIS   carvedilol (COREG) 12.5 MG tablet Take 1 tablet (12.5 mg total) by mouth 2 (two) times daily.   cinacalcet (SENSIPAR) 30 MG tablet Take 1 tablet (30 mg total) by mouth Every Tuesday,Thursday,and Saturday with dialysis.   Continuous Blood Gluc Sensor (FREESTYLE LIBRE 14 DAY SENSOR) MISC Use as directed   fenofibrate 160 MG tablet Take 1 tablet (160 mg total) by mouth daily.   fexofenadine (ALLEGRA) 180 MG tablet Take 180 mg by mouth daily.   furosemide (LASIX) 40 MG tablet Take 3 tablets (120 mg total) by mouth daily.   gabapentin (NEURONTIN) 300 MG capsule Take 2 capsules (600 mg total) by mouth 2 (two) times daily. (Patient taking differently: Take 600 mg by mouth. Take 1 capsule every morning and 2 capsules every evening)   glucose blood test strip Check 1 time daily. E11.9 One Touch Ultra Blue Test Strips   HUMALOG KWIKPEN 200 UNIT/ML KwikPen USE TWICE DAILY WITH LUNCH AND SUPPER PER SLIDING SCALE (MAXIMUM DAILY DOSE OF 28 UNITS) . APPOINTMENT REQUIRED FOR FURTHER REFILLS   insulin  glargine (LANTUS SOLOSTAR) 100 UNIT/ML Solostar Pen Inject 12 units subcutaneously at bed time. (Patient taking differently: 16 Units. Inject 16 units subcutaneously at bed time.)   Insulin Pen Needle (BD PEN NEEDLE NANO U/F) 32G X 4 MM MISC USE 1 PEN NEEDLE SUBCUTANEOUSLY WITH INSULIN 4 TIMES DAILY   lanthanum (FOSRENOL) 500 MG chewable tablet Chew 500-1,000 mg by mouth See admin instructions. Take 1000 mg with meals three time a day and 500 mg with snacks    midodrine (PROAMATINE) 10 MG tablet Take 10 mg by mouth 3 (three) times a week.   multivitamin (RENA-VIT) TABS tablet Take 1 tablet by mouth daily.   Olopatadine HCl 0.2 % SOLN Place 1 drop into both eyes daily as needed (for allergies).    ondansetron (ZOFRAN ODT) 8 MG disintegrating tablet Take 1 tablet (8 mg total) by mouth every 8 (eight) hours as needed for nausea or vomiting.   No facility-administered encounter medications on file as of 05/31/2021.    Patient Active Problem List   Diagnosis Date Noted   Malnutrition of moderate degree 11/18/2019   Below-knee amputation of left lower extremity ([place]) 11/16/2019   Abscess of left foot 11/13/2019   Dehiscence of amputation stump ([place])    History of complete ray amputation of fifth toe of left foot ([place]) 10/09/2019   Acute pulmonary edema ([facility])    Osteomyelitis of fifth toe of left foot ([place])    Febrile illness 08/17/2019   SIRS (systemic inflammatory response syndrome) ([place]) 08/17/2019   Thrombocytopenia ([place]) 08/17/2019   Pressure injury of back, stage 2 ([place]) 08/17/2019   Hypertensive emergency 07/10/2019   Onychomycosis 08/16/2016   Osteopathy in diseases classified elsewhere, unspecified site 05/14/2016   S/P bilateral below knee amputation ([place]) 04/26/2016   Bilateral carotid bruits 03/30/2016   Diabetes mellitus with complication ([place])    Anemia due to chronic kidney disease    ESRD on dialysis ([place]) 11/30/2015   Acute respiratory failure with hypoxia ([place]) 11/29/2015   Dependence on renal dialysis ([place]) 08/15/2015   Presence of automatic (implantable) cardiac defibrillator 03/12/2015   At high risk for falls 09/29/2014   Peripheral vascular disease ([place]) 09/29/2014   Osteoarthritis of both knees 01/07/2014   Osteoarthritis of multiple joints 10/06/2013   NICM (nonischemic cardiomyopathy) ([place]) 06/27/2013   CAD (coronary artery disease) 03/13/2013   Abnormal nuclear stress test 78/67/5449   Chronic systolic congestive  heart failure ([place]) 01/27/2013   COPD (chronic obstructive pulmonary disease) ([place]) 12/27/2012   Diabetic peripheral neuropathy ([place]) 02/10/2012   Urethral stricture 10/06/2010   URINARY CALCULUS 09/28/2009   Adjustment disorder with depressed mood 08/21/2009   Poorly controlled type II diabetes mellitus with renal complication ([place]) 20/10/710   Hyperlipidemia 04/14/2008   Essential hypertension 04/14/2008   Allergic rhinitis 04/14/2008    Conditions to be addressed/monitored: CHF, CAD, HTN, HLD, DMII, and ESRD  Care Plan : RN Care Manager Plan of Care  Updates made by Dimitri Ped, RN since 05/31/2021 12:00 AM     Problem: Care Coordination Needs (DM2, CHF, CAD, ESRD, HTN, HLD)   Priority: High     Long-Range Goal: Establish Plan of Care for Care Coordination Needs (DM2, CHF, CAD, ESRD, HTN, HLD)   Start Date: 03/29/2021  Expected End Date: 03/29/2022  Priority: High  Note:   Current Barriers:  Knowledge Deficits related to plan of care for management of CHF, CAD, HTN, HLD, DMII, ESRD, and frequent falls  Care Coordination needs related to ADL IADL  limitations Chronic Disease Management support and education needs related to CHF, CAD, HTN, HLD, DMII, ESRD, and frequent falls  States he saw Dr. Elease Hashimoto a few weeks ago and his A1C was 7.7%.  States he is working on adjusting his sliding scale insulin.  States he is wearing his Freestyle Snead and scans 3-4 times a day.   States his blood sugars can range from 70-80 in the morning and 180-200 later in the day.  States he has had a couple to low readings usually in the morning which he treats with orange juice. States he has lower readings in morning is he did not eat as much for dinner. Denies any recent falls. States he does have balance problems and uses his cane when walking with his prostheses.  States his B/P runs around 150/60 but will be too low at dialysis. Denies any chest pains, shortness of breath.    RNCM Clinical  Goal(s):  Patient will verbalize understanding of plan for management of CHF, CAD, HTN, HLD, DMII, ESRD, and frequent falls as evidenced by voiced adherence to plan of care verbalize basic understanding of  CHF, CAD, HTN, HLD, DMII, ESRD, and frequent falls disease process and self health management plan as evidenced by voiced understanding and teach back take all medications exactly as prescribed and will call provider for medication related questions as evidenced by dispense report and pt verbalization  attend all scheduled medical appointments: cardiology 12/07/21, Annual Wellness Visit 01/24/22 as evidenced by medical records demonstrate Improved adherence to prescribed treatment plan for CHF, CAD, HTN, HLD, DMII, ESRD, and frequent falls as evidenced by voiced understanding and teach back dispense report and pt verbalization   through collaboration with RN Care manager, provider, and care team.   Interventions: 1:1 collaboration with primary care provider regarding development and update of comprehensive plan of care as evidenced by provider attestation and co-signature Inter-disciplinary care team collaboration (see longitudinal plan of care) Evaluation of current treatment plan related to  self management and patient's adherence to plan as established by provider   CAD Interventions: (Status:  Goal on track:  Yes.) Long Term Goal Assessed understanding of CAD diagnosis Medications reviewed including medications utilized in CAD treatment plan Provided education on importance of blood pressure control in management of CAD Provided education on Importance of limiting foods high in cholesterol Counseled on importance of regular laboratory monitoring as prescribed Reviewed Importance of taking all medications as prescribed Reviewed Importance of attending all scheduled provider appointments Advised to report any changes in symptoms or exercise tolerance   Heart Failure Interventions:   (Status:  Goal on track:  Yes.) Long Term Goal Basic overview and discussion of pathophysiology of Heart Failure reviewed Provided education on low sodium diet Discussed importance of daily weight and advised patient to weigh and record daily Reviewed role of diuretics in prevention of fluid overload and management of heart failure; Revewed s/sx to call provider   Chronic Kidney Disease Interventions:  (Status:  Goal on track:  Yes.) Long Term Goal Assessed the Patient understanding of chronic kidney disease    Evaluation of current treatment plan related to chronic kidney disease self management and patient's adherence to plan as established by provider      Provided education to patient re: stroke prevention, s/s of heart attack and stroke    Reviewed medications with patient and discussed importance of compliance    Advised patient, providing education and rationale, to monitor blood pressure daily and record, calling PCP for  findings outside established parameters    Assessed social determinant of health barriers    Last practice recorded BP readings:  BP Readings from Last 3 Encounters:  05/20/21 140/80  05/17/21 124/64  02/23/21 (!) 150/60  Most recent eGFR/CrCl: No results found for: EGFR  No components found for: CRCL    Diabetes Interventions:  (Status:  Goal on track:  Yes.) Long Term Goal Assessed patient's understanding of A1c goal: <7% Provided education to patient about basic DM disease process Reviewed medications with patient and discussed importance of medication adherence Counseled on importance of regular laboratory monitoring as prescribed Provided patient with written educational materials related to hypo and hyperglycemia and importance of correct treatment Advised patient, providing education and rationale, to check cbg 3-4 times a day and record, calling provider for findings outside established parameters Reviewed to eat a small snack at night is he does not eat  as much dinner as planned Lab Results  Component Value Date   HGBA1C 7.7 (A) 05/17/2021   Falls Interventions:  (Status:  Goal on track:  Yes.) Long Term Goal Provided written and verbal education re: potential causes of falls and Fall prevention strategies Reviewed medications and discussed potential side effects of medications such as dizziness and frequent urination Assessed for signs and symptoms of orthostatic hypotension Assessed for falls since last encounter Assessed patients knowledge of fall risk prevention secondary to previously provided education   Hyperlipidemia Interventions:  (Status:  Goal on track:  Yes.) Long Term Goal Medication review performed; medication list updated in electronic medical record.  Provider established cholesterol goals reviewed Counseled on importance of regular laboratory monitoring as prescribed Reviewed role and benefits of statin for ASCVD risk reduction Reviewed importance of limiting foods high in cholesterol  Hypertension Interventions:  (Status:  Goal on track:  Yes.) Long Term Goal Last practice recorded BP readings:  BP Readings from Last 3 Encounters:  05/20/21 140/80  05/17/21 124/64  02/23/21 (!) 150/60  Most recent eGFR/CrCl: No results found for: EGFR  No components found for: CRCL  Evaluation of current treatment plan related to hypertension self management and patient's adherence to plan as established by provider Provided education to patient re: stroke prevention, s/s of heart attack and stroke Reviewed medications with patient and discussed importance of compliance Discussed plans with patient for ongoing care management follow up and provided patient with direct contact information for care management team Advised patient, providing education and rationale, to monitor blood pressure daily and record, calling PCP for findings outside established parameters Provided education on prescribed diet low sodium, low CHO renal  diet Discussed complications of poorly controlled blood pressure such as heart disease, stroke, circulatory complications, vision complications, kidney impairment, sexual dysfunction  Patient Goals/Self-Care Activities: Take all medications as prescribed Attend all scheduled provider appointments Call pharmacy for medication refills 3-7 days in advance of running out of medications Call provider office for new concerns or questions  call office if I gain more than 2 pounds in one day or 5 pounds in one week weigh myself daily track symptoms and what helps feel better or worse keep appointment with eye doctor check blood sugar at prescribed times: before meals and at bedtime fill half of plate with vegetables manage portion size check blood pressure 3 times per week choose a place to take my blood pressure (home, clinic or office, retail store) keep a blood pressure log take blood pressure log to all doctor appointments call doctor for signs and symptoms of  high blood pressure keep all doctor appointments call for medicine refill 2 or 3 days before it runs out take all medications exactly as prescribed call doctor with any symptoms you believe are related to your medicine  Follow Up Plan:  Telephone follow up appointment with care management team member scheduled for:  08/23/21 The patient has been provided with contact information for the care management team and has been advised to call with any health related questions or concerns.       Plan: Telephone follow up appointment with care management team member scheduled for:  08/23/21 The patient has been provided with contact information for the care management team and has been advised to call with any health related questions or concerns.   Peter Garter RN, Jackquline Denmark, CDE Care Management Coordinator [facility] [phone]

## 2021-05-31 NOTE — Patient Instructions (Signed)
Visit Information  Thank you for taking time to visit with me today. Please don't hesitate to contact me if I can be of assistance to you before our next scheduled telephone appointment.  Following are the goals we discussed today:  Take all medications as prescribed Attend all scheduled provider appointments Call pharmacy for medication refills 3-7 days in advance of running out of medications Call provider office for new concerns or questions  call office if I gain more than 2 pounds in one day or 5 pounds in one week weigh myself daily track symptoms and what helps feel better or worse keep appointment with eye doctor check blood sugar at prescribed times: before meals and at bedtime fill half of plate with vegetables manage portion size check blood pressure 3 times per week choose a place to take my blood pressure (home, clinic or office, retail store) keep a blood pressure log take blood pressure log to all doctor appointments call doctor for signs and symptoms of high blood pressure keep all doctor appointments call for medicine refill 2 or 3 days before it runs out take all medications exactly as prescribed call doctor with any symptoms you believe are related to your medicine  Our next appointment is by telephone on 08/23/21 at 11:30 AM  Please call the care guide team at [phone] if you need to cancel or reschedule your appointment.   If you are experiencing a Mental Health or [place] or need someone to talk to, please call the Suicide and Crisis Lifeline: 988 call the [place] National Suicide Prevention Lifeline: 301-005-7210 or TTY: (402)318-4646 TTY 208-306-5049) to talk to a trained counselor call 1-800-273-TALK (toll free, 24 hour hotline) go to [facility] [address], [place] [phone]) call 911   Patient verbalizes understanding of instructions and care plan provided today and agrees to view in  [place]. Active MyChart status and patient understanding of how to access instructions and care plan via MyChart confirmed with patient.     Telephone follow up appointment with care management team member scheduled for: 08/23/21 at 11:30 AM  Peter Garter RN, Jackquline Denmark, CDE Care Management Coordinator [facility] [phone]

## 2021-06-01 DIAGNOSIS — N250 Renal osteodystrophy: Secondary | ICD-10-CM | POA: Diagnosis not present

## 2021-06-01 DIAGNOSIS — D631 Anemia in chronic kidney disease: Secondary | ICD-10-CM | POA: Diagnosis not present

## 2021-06-01 DIAGNOSIS — D509 Iron deficiency anemia, unspecified: Secondary | ICD-10-CM | POA: Diagnosis not present

## 2021-06-01 DIAGNOSIS — N2581 Secondary hyperparathyroidism of renal origin: Secondary | ICD-10-CM | POA: Diagnosis not present

## 2021-06-01 DIAGNOSIS — Z992 Dependence on renal dialysis: Secondary | ICD-10-CM | POA: Diagnosis not present

## 2021-06-01 DIAGNOSIS — N186 End stage renal disease: Secondary | ICD-10-CM | POA: Diagnosis not present

## 2021-06-03 DIAGNOSIS — D631 Anemia in chronic kidney disease: Secondary | ICD-10-CM | POA: Diagnosis not present

## 2021-06-03 DIAGNOSIS — N2581 Secondary hyperparathyroidism of renal origin: Secondary | ICD-10-CM | POA: Diagnosis not present

## 2021-06-03 DIAGNOSIS — N250 Renal osteodystrophy: Secondary | ICD-10-CM | POA: Diagnosis not present

## 2021-06-03 DIAGNOSIS — N186 End stage renal disease: Secondary | ICD-10-CM | POA: Diagnosis not present

## 2021-06-03 DIAGNOSIS — D509 Iron deficiency anemia, unspecified: Secondary | ICD-10-CM | POA: Diagnosis not present

## 2021-06-03 DIAGNOSIS — Z992 Dependence on renal dialysis: Secondary | ICD-10-CM | POA: Diagnosis not present

## 2021-06-05 DIAGNOSIS — N186 End stage renal disease: Secondary | ICD-10-CM | POA: Diagnosis not present

## 2021-06-05 DIAGNOSIS — D631 Anemia in chronic kidney disease: Secondary | ICD-10-CM | POA: Diagnosis not present

## 2021-06-05 DIAGNOSIS — D509 Iron deficiency anemia, unspecified: Secondary | ICD-10-CM | POA: Diagnosis not present

## 2021-06-05 DIAGNOSIS — N2581 Secondary hyperparathyroidism of renal origin: Secondary | ICD-10-CM | POA: Diagnosis not present

## 2021-06-05 DIAGNOSIS — Z992 Dependence on renal dialysis: Secondary | ICD-10-CM | POA: Diagnosis not present

## 2021-06-05 DIAGNOSIS — N250 Renal osteodystrophy: Secondary | ICD-10-CM | POA: Diagnosis not present

## 2021-06-08 ENCOUNTER — Other Ambulatory Visit: Payer: Self-pay

## 2021-06-08 DIAGNOSIS — Z992 Dependence on renal dialysis: Secondary | ICD-10-CM | POA: Diagnosis not present

## 2021-06-08 DIAGNOSIS — N186 End stage renal disease: Secondary | ICD-10-CM | POA: Diagnosis not present

## 2021-06-08 DIAGNOSIS — D509 Iron deficiency anemia, unspecified: Secondary | ICD-10-CM | POA: Diagnosis not present

## 2021-06-08 DIAGNOSIS — D631 Anemia in chronic kidney disease: Secondary | ICD-10-CM | POA: Diagnosis not present

## 2021-06-08 DIAGNOSIS — N2581 Secondary hyperparathyroidism of renal origin: Secondary | ICD-10-CM | POA: Diagnosis not present

## 2021-06-08 DIAGNOSIS — N250 Renal osteodystrophy: Secondary | ICD-10-CM | POA: Diagnosis not present

## 2021-06-08 MED ORDER — HUMALOG KWIKPEN 200 UNIT/ML ~~LOC~~ SOPN: PEN_INJECTOR | SUBCUTANEOUS | 2 refills | Status: DC

## 2021-06-09 DIAGNOSIS — Z992 Dependence on renal dialysis: Secondary | ICD-10-CM | POA: Diagnosis not present

## 2021-06-09 DIAGNOSIS — N186 End stage renal disease: Secondary | ICD-10-CM | POA: Diagnosis not present

## 2021-06-10 DIAGNOSIS — N2581 Secondary hyperparathyroidism of renal origin: Secondary | ICD-10-CM | POA: Diagnosis not present

## 2021-06-10 DIAGNOSIS — Z992 Dependence on renal dialysis: Secondary | ICD-10-CM | POA: Diagnosis not present

## 2021-06-10 DIAGNOSIS — N250 Renal osteodystrophy: Secondary | ICD-10-CM | POA: Diagnosis not present

## 2021-06-10 DIAGNOSIS — N186 End stage renal disease: Secondary | ICD-10-CM | POA: Diagnosis not present

## 2021-06-10 DIAGNOSIS — E559 Vitamin D deficiency, unspecified: Secondary | ICD-10-CM | POA: Diagnosis not present

## 2021-06-10 DIAGNOSIS — D509 Iron deficiency anemia, unspecified: Secondary | ICD-10-CM | POA: Diagnosis not present

## 2021-06-10 DIAGNOSIS — D631 Anemia in chronic kidney disease: Secondary | ICD-10-CM | POA: Diagnosis not present

## 2021-06-12 DIAGNOSIS — D631 Anemia in chronic kidney disease: Secondary | ICD-10-CM | POA: Diagnosis not present

## 2021-06-12 DIAGNOSIS — N186 End stage renal disease: Secondary | ICD-10-CM | POA: Diagnosis not present

## 2021-06-12 DIAGNOSIS — D509 Iron deficiency anemia, unspecified: Secondary | ICD-10-CM | POA: Diagnosis not present

## 2021-06-12 DIAGNOSIS — N2581 Secondary hyperparathyroidism of renal origin: Secondary | ICD-10-CM | POA: Diagnosis not present

## 2021-06-12 DIAGNOSIS — N250 Renal osteodystrophy: Secondary | ICD-10-CM | POA: Diagnosis not present

## 2021-06-15 DIAGNOSIS — N186 End stage renal disease: Secondary | ICD-10-CM | POA: Diagnosis not present

## 2021-06-15 DIAGNOSIS — N2581 Secondary hyperparathyroidism of renal origin: Secondary | ICD-10-CM | POA: Diagnosis not present

## 2021-06-15 DIAGNOSIS — D509 Iron deficiency anemia, unspecified: Secondary | ICD-10-CM | POA: Diagnosis not present

## 2021-06-15 DIAGNOSIS — D631 Anemia in chronic kidney disease: Secondary | ICD-10-CM | POA: Diagnosis not present

## 2021-06-15 DIAGNOSIS — N250 Renal osteodystrophy: Secondary | ICD-10-CM | POA: Diagnosis not present

## 2021-06-17 DIAGNOSIS — D631 Anemia in chronic kidney disease: Secondary | ICD-10-CM | POA: Diagnosis not present

## 2021-06-17 DIAGNOSIS — D509 Iron deficiency anemia, unspecified: Secondary | ICD-10-CM | POA: Diagnosis not present

## 2021-06-17 DIAGNOSIS — N186 End stage renal disease: Secondary | ICD-10-CM | POA: Diagnosis not present

## 2021-06-17 DIAGNOSIS — N250 Renal osteodystrophy: Secondary | ICD-10-CM | POA: Diagnosis not present

## 2021-06-17 DIAGNOSIS — N2581 Secondary hyperparathyroidism of renal origin: Secondary | ICD-10-CM | POA: Diagnosis not present

## 2021-06-19 DIAGNOSIS — D631 Anemia in chronic kidney disease: Secondary | ICD-10-CM | POA: Diagnosis not present

## 2021-06-19 DIAGNOSIS — N186 End stage renal disease: Secondary | ICD-10-CM | POA: Diagnosis not present

## 2021-06-19 DIAGNOSIS — N2581 Secondary hyperparathyroidism of renal origin: Secondary | ICD-10-CM | POA: Diagnosis not present

## 2021-06-19 DIAGNOSIS — D509 Iron deficiency anemia, unspecified: Secondary | ICD-10-CM | POA: Diagnosis not present

## 2021-06-19 DIAGNOSIS — N250 Renal osteodystrophy: Secondary | ICD-10-CM | POA: Diagnosis not present

## 2021-06-22 DIAGNOSIS — N250 Renal osteodystrophy: Secondary | ICD-10-CM | POA: Diagnosis not present

## 2021-06-22 DIAGNOSIS — N2581 Secondary hyperparathyroidism of renal origin: Secondary | ICD-10-CM | POA: Diagnosis not present

## 2021-06-22 DIAGNOSIS — D631 Anemia in chronic kidney disease: Secondary | ICD-10-CM | POA: Diagnosis not present

## 2021-06-22 DIAGNOSIS — D509 Iron deficiency anemia, unspecified: Secondary | ICD-10-CM | POA: Diagnosis not present

## 2021-06-22 DIAGNOSIS — N186 End stage renal disease: Secondary | ICD-10-CM | POA: Diagnosis not present

## 2021-06-24 DIAGNOSIS — N186 End stage renal disease: Secondary | ICD-10-CM | POA: Diagnosis not present

## 2021-06-24 DIAGNOSIS — D631 Anemia in chronic kidney disease: Secondary | ICD-10-CM | POA: Diagnosis not present

## 2021-06-24 DIAGNOSIS — N250 Renal osteodystrophy: Secondary | ICD-10-CM | POA: Diagnosis not present

## 2021-06-24 DIAGNOSIS — D509 Iron deficiency anemia, unspecified: Secondary | ICD-10-CM | POA: Diagnosis not present

## 2021-06-24 DIAGNOSIS — N2581 Secondary hyperparathyroidism of renal origin: Secondary | ICD-10-CM | POA: Diagnosis not present

## 2021-06-26 DIAGNOSIS — N250 Renal osteodystrophy: Secondary | ICD-10-CM | POA: Diagnosis not present

## 2021-06-26 DIAGNOSIS — N2581 Secondary hyperparathyroidism of renal origin: Secondary | ICD-10-CM | POA: Diagnosis not present

## 2021-06-26 DIAGNOSIS — D631 Anemia in chronic kidney disease: Secondary | ICD-10-CM | POA: Diagnosis not present

## 2021-06-26 DIAGNOSIS — D509 Iron deficiency anemia, unspecified: Secondary | ICD-10-CM | POA: Diagnosis not present

## 2021-06-26 DIAGNOSIS — N186 End stage renal disease: Secondary | ICD-10-CM | POA: Diagnosis not present

## 2021-06-29 DIAGNOSIS — N186 End stage renal disease: Secondary | ICD-10-CM | POA: Diagnosis not present

## 2021-06-29 DIAGNOSIS — N250 Renal osteodystrophy: Secondary | ICD-10-CM | POA: Diagnosis not present

## 2021-06-29 DIAGNOSIS — D509 Iron deficiency anemia, unspecified: Secondary | ICD-10-CM | POA: Diagnosis not present

## 2021-06-29 DIAGNOSIS — N2581 Secondary hyperparathyroidism of renal origin: Secondary | ICD-10-CM | POA: Diagnosis not present

## 2021-06-29 DIAGNOSIS — D631 Anemia in chronic kidney disease: Secondary | ICD-10-CM | POA: Diagnosis not present

## 2021-07-01 DIAGNOSIS — N250 Renal osteodystrophy: Secondary | ICD-10-CM | POA: Diagnosis not present

## 2021-07-01 DIAGNOSIS — D509 Iron deficiency anemia, unspecified: Secondary | ICD-10-CM | POA: Diagnosis not present

## 2021-07-01 DIAGNOSIS — D631 Anemia in chronic kidney disease: Secondary | ICD-10-CM | POA: Diagnosis not present

## 2021-07-01 DIAGNOSIS — N2581 Secondary hyperparathyroidism of renal origin: Secondary | ICD-10-CM | POA: Diagnosis not present

## 2021-07-01 DIAGNOSIS — N186 End stage renal disease: Secondary | ICD-10-CM | POA: Diagnosis not present

## 2021-07-03 DIAGNOSIS — D509 Iron deficiency anemia, unspecified: Secondary | ICD-10-CM | POA: Diagnosis not present

## 2021-07-03 DIAGNOSIS — D631 Anemia in chronic kidney disease: Secondary | ICD-10-CM | POA: Diagnosis not present

## 2021-07-03 DIAGNOSIS — N2581 Secondary hyperparathyroidism of renal origin: Secondary | ICD-10-CM | POA: Diagnosis not present

## 2021-07-03 DIAGNOSIS — N250 Renal osteodystrophy: Secondary | ICD-10-CM | POA: Diagnosis not present

## 2021-07-03 DIAGNOSIS — N186 End stage renal disease: Secondary | ICD-10-CM | POA: Diagnosis not present

## 2021-07-06 ENCOUNTER — Other Ambulatory Visit: Payer: Self-pay | Admitting: Family Medicine

## 2021-07-06 DIAGNOSIS — N186 End stage renal disease: Secondary | ICD-10-CM | POA: Diagnosis not present

## 2021-07-06 DIAGNOSIS — D509 Iron deficiency anemia, unspecified: Secondary | ICD-10-CM | POA: Diagnosis not present

## 2021-07-06 DIAGNOSIS — N2581 Secondary hyperparathyroidism of renal origin: Secondary | ICD-10-CM | POA: Diagnosis not present

## 2021-07-06 DIAGNOSIS — N250 Renal osteodystrophy: Secondary | ICD-10-CM | POA: Diagnosis not present

## 2021-07-06 DIAGNOSIS — D631 Anemia in chronic kidney disease: Secondary | ICD-10-CM | POA: Diagnosis not present

## 2021-07-08 DIAGNOSIS — N186 End stage renal disease: Secondary | ICD-10-CM | POA: Diagnosis not present

## 2021-07-08 DIAGNOSIS — D631 Anemia in chronic kidney disease: Secondary | ICD-10-CM | POA: Diagnosis not present

## 2021-07-08 DIAGNOSIS — D509 Iron deficiency anemia, unspecified: Secondary | ICD-10-CM | POA: Diagnosis not present

## 2021-07-08 DIAGNOSIS — N250 Renal osteodystrophy: Secondary | ICD-10-CM | POA: Diagnosis not present

## 2021-07-08 DIAGNOSIS — N2581 Secondary hyperparathyroidism of renal origin: Secondary | ICD-10-CM | POA: Diagnosis not present

## 2021-07-09 DIAGNOSIS — N186 End stage renal disease: Secondary | ICD-10-CM | POA: Diagnosis not present

## 2021-07-09 DIAGNOSIS — Z992 Dependence on renal dialysis: Secondary | ICD-10-CM | POA: Diagnosis not present

## 2021-07-10 DIAGNOSIS — N250 Renal osteodystrophy: Secondary | ICD-10-CM | POA: Diagnosis not present

## 2021-07-10 DIAGNOSIS — D631 Anemia in chronic kidney disease: Secondary | ICD-10-CM | POA: Diagnosis not present

## 2021-07-10 DIAGNOSIS — N2581 Secondary hyperparathyroidism of renal origin: Secondary | ICD-10-CM | POA: Diagnosis not present

## 2021-07-10 DIAGNOSIS — Z992 Dependence on renal dialysis: Secondary | ICD-10-CM | POA: Diagnosis not present

## 2021-07-10 DIAGNOSIS — N186 End stage renal disease: Secondary | ICD-10-CM | POA: Diagnosis not present

## 2021-07-10 DIAGNOSIS — E559 Vitamin D deficiency, unspecified: Secondary | ICD-10-CM | POA: Diagnosis not present

## 2021-07-10 DIAGNOSIS — D509 Iron deficiency anemia, unspecified: Secondary | ICD-10-CM | POA: Diagnosis not present

## 2021-07-13 DIAGNOSIS — N2581 Secondary hyperparathyroidism of renal origin: Secondary | ICD-10-CM | POA: Diagnosis not present

## 2021-07-13 DIAGNOSIS — N250 Renal osteodystrophy: Secondary | ICD-10-CM | POA: Diagnosis not present

## 2021-07-13 DIAGNOSIS — D631 Anemia in chronic kidney disease: Secondary | ICD-10-CM | POA: Diagnosis not present

## 2021-07-13 DIAGNOSIS — D509 Iron deficiency anemia, unspecified: Secondary | ICD-10-CM | POA: Diagnosis not present

## 2021-07-13 DIAGNOSIS — Z992 Dependence on renal dialysis: Secondary | ICD-10-CM | POA: Diagnosis not present

## 2021-07-13 DIAGNOSIS — N186 End stage renal disease: Secondary | ICD-10-CM | POA: Diagnosis not present

## 2021-07-15 DIAGNOSIS — N250 Renal osteodystrophy: Secondary | ICD-10-CM | POA: Diagnosis not present

## 2021-07-15 DIAGNOSIS — N186 End stage renal disease: Secondary | ICD-10-CM | POA: Diagnosis not present

## 2021-07-15 DIAGNOSIS — D631 Anemia in chronic kidney disease: Secondary | ICD-10-CM | POA: Diagnosis not present

## 2021-07-15 DIAGNOSIS — D509 Iron deficiency anemia, unspecified: Secondary | ICD-10-CM | POA: Diagnosis not present

## 2021-07-15 DIAGNOSIS — Z992 Dependence on renal dialysis: Secondary | ICD-10-CM | POA: Diagnosis not present

## 2021-07-15 DIAGNOSIS — N2581 Secondary hyperparathyroidism of renal origin: Secondary | ICD-10-CM | POA: Diagnosis not present

## 2021-07-17 DIAGNOSIS — H2513 Age-related nuclear cataract, bilateral: Secondary | ICD-10-CM | POA: Diagnosis not present

## 2021-07-17 DIAGNOSIS — D631 Anemia in chronic kidney disease: Secondary | ICD-10-CM | POA: Diagnosis not present

## 2021-07-17 DIAGNOSIS — N250 Renal osteodystrophy: Secondary | ICD-10-CM | POA: Diagnosis not present

## 2021-07-17 DIAGNOSIS — N186 End stage renal disease: Secondary | ICD-10-CM | POA: Diagnosis not present

## 2021-07-17 DIAGNOSIS — H40033 Anatomical narrow angle, bilateral: Secondary | ICD-10-CM | POA: Diagnosis not present

## 2021-07-17 DIAGNOSIS — N2581 Secondary hyperparathyroidism of renal origin: Secondary | ICD-10-CM | POA: Diagnosis not present

## 2021-07-17 DIAGNOSIS — Z992 Dependence on renal dialysis: Secondary | ICD-10-CM | POA: Diagnosis not present

## 2021-07-17 DIAGNOSIS — D509 Iron deficiency anemia, unspecified: Secondary | ICD-10-CM | POA: Diagnosis not present

## 2021-07-20 DIAGNOSIS — N250 Renal osteodystrophy: Secondary | ICD-10-CM | POA: Diagnosis not present

## 2021-07-20 DIAGNOSIS — N186 End stage renal disease: Secondary | ICD-10-CM | POA: Diagnosis not present

## 2021-07-20 DIAGNOSIS — Z992 Dependence on renal dialysis: Secondary | ICD-10-CM | POA: Diagnosis not present

## 2021-07-20 DIAGNOSIS — D631 Anemia in chronic kidney disease: Secondary | ICD-10-CM | POA: Diagnosis not present

## 2021-07-20 DIAGNOSIS — D509 Iron deficiency anemia, unspecified: Secondary | ICD-10-CM | POA: Diagnosis not present

## 2021-07-20 DIAGNOSIS — N2581 Secondary hyperparathyroidism of renal origin: Secondary | ICD-10-CM | POA: Diagnosis not present

## 2021-07-22 DIAGNOSIS — N250 Renal osteodystrophy: Secondary | ICD-10-CM | POA: Diagnosis not present

## 2021-07-22 DIAGNOSIS — Z992 Dependence on renal dialysis: Secondary | ICD-10-CM | POA: Diagnosis not present

## 2021-07-22 DIAGNOSIS — N186 End stage renal disease: Secondary | ICD-10-CM | POA: Diagnosis not present

## 2021-07-22 DIAGNOSIS — N2581 Secondary hyperparathyroidism of renal origin: Secondary | ICD-10-CM | POA: Diagnosis not present

## 2021-07-22 DIAGNOSIS — D631 Anemia in chronic kidney disease: Secondary | ICD-10-CM | POA: Diagnosis not present

## 2021-07-22 DIAGNOSIS — D509 Iron deficiency anemia, unspecified: Secondary | ICD-10-CM | POA: Diagnosis not present

## 2021-07-24 DIAGNOSIS — N2581 Secondary hyperparathyroidism of renal origin: Secondary | ICD-10-CM | POA: Diagnosis not present

## 2021-07-24 DIAGNOSIS — N250 Renal osteodystrophy: Secondary | ICD-10-CM | POA: Diagnosis not present

## 2021-07-24 DIAGNOSIS — D509 Iron deficiency anemia, unspecified: Secondary | ICD-10-CM | POA: Diagnosis not present

## 2021-07-24 DIAGNOSIS — N186 End stage renal disease: Secondary | ICD-10-CM | POA: Diagnosis not present

## 2021-07-24 DIAGNOSIS — D631 Anemia in chronic kidney disease: Secondary | ICD-10-CM | POA: Diagnosis not present

## 2021-07-24 DIAGNOSIS — Z992 Dependence on renal dialysis: Secondary | ICD-10-CM | POA: Diagnosis not present

## 2021-07-27 DIAGNOSIS — N186 End stage renal disease: Secondary | ICD-10-CM | POA: Diagnosis not present

## 2021-07-27 DIAGNOSIS — N2581 Secondary hyperparathyroidism of renal origin: Secondary | ICD-10-CM | POA: Diagnosis not present

## 2021-07-27 DIAGNOSIS — D509 Iron deficiency anemia, unspecified: Secondary | ICD-10-CM | POA: Diagnosis not present

## 2021-07-27 DIAGNOSIS — N250 Renal osteodystrophy: Secondary | ICD-10-CM | POA: Diagnosis not present

## 2021-07-27 DIAGNOSIS — Z992 Dependence on renal dialysis: Secondary | ICD-10-CM | POA: Diagnosis not present

## 2021-07-27 DIAGNOSIS — D631 Anemia in chronic kidney disease: Secondary | ICD-10-CM | POA: Diagnosis not present

## 2021-07-29 ENCOUNTER — Ambulatory Visit (INDEPENDENT_AMBULATORY_CARE_PROVIDER_SITE_OTHER): Payer: Medicare Other

## 2021-07-29 DIAGNOSIS — I6523 Occlusion and stenosis of bilateral carotid arteries: Secondary | ICD-10-CM

## 2021-07-29 DIAGNOSIS — N2581 Secondary hyperparathyroidism of renal origin: Secondary | ICD-10-CM | POA: Diagnosis not present

## 2021-07-29 DIAGNOSIS — D509 Iron deficiency anemia, unspecified: Secondary | ICD-10-CM | POA: Diagnosis not present

## 2021-07-29 DIAGNOSIS — D631 Anemia in chronic kidney disease: Secondary | ICD-10-CM | POA: Diagnosis not present

## 2021-07-29 DIAGNOSIS — N186 End stage renal disease: Secondary | ICD-10-CM | POA: Diagnosis not present

## 2021-07-29 DIAGNOSIS — Z992 Dependence on renal dialysis: Secondary | ICD-10-CM | POA: Diagnosis not present

## 2021-07-29 DIAGNOSIS — N250 Renal osteodystrophy: Secondary | ICD-10-CM | POA: Diagnosis not present

## 2021-07-31 DIAGNOSIS — D631 Anemia in chronic kidney disease: Secondary | ICD-10-CM | POA: Diagnosis not present

## 2021-07-31 DIAGNOSIS — D509 Iron deficiency anemia, unspecified: Secondary | ICD-10-CM | POA: Diagnosis not present

## 2021-07-31 DIAGNOSIS — N250 Renal osteodystrophy: Secondary | ICD-10-CM | POA: Diagnosis not present

## 2021-07-31 DIAGNOSIS — N2581 Secondary hyperparathyroidism of renal origin: Secondary | ICD-10-CM | POA: Diagnosis not present

## 2021-07-31 DIAGNOSIS — N186 End stage renal disease: Secondary | ICD-10-CM | POA: Diagnosis not present

## 2021-07-31 DIAGNOSIS — Z992 Dependence on renal dialysis: Secondary | ICD-10-CM | POA: Diagnosis not present

## 2021-08-03 DIAGNOSIS — N250 Renal osteodystrophy: Secondary | ICD-10-CM | POA: Diagnosis not present

## 2021-08-03 DIAGNOSIS — D509 Iron deficiency anemia, unspecified: Secondary | ICD-10-CM | POA: Diagnosis not present

## 2021-08-03 DIAGNOSIS — N2581 Secondary hyperparathyroidism of renal origin: Secondary | ICD-10-CM | POA: Diagnosis not present

## 2021-08-03 DIAGNOSIS — N186 End stage renal disease: Secondary | ICD-10-CM | POA: Diagnosis not present

## 2021-08-03 DIAGNOSIS — D631 Anemia in chronic kidney disease: Secondary | ICD-10-CM | POA: Diagnosis not present

## 2021-08-03 DIAGNOSIS — Z992 Dependence on renal dialysis: Secondary | ICD-10-CM | POA: Diagnosis not present

## 2021-08-04 ENCOUNTER — Telehealth: Payer: Self-pay

## 2021-08-04 NOTE — Telephone Encounter (Signed)
-----   Message from Berlinda Last, [place] sent at 08/04/2021  1:07 PM EDT -----  ----- Message ----- From: Arnoldo Lenis, MD Sent: 08/04/2021  12:57 PM EDT To: Berlinda Last, CMA  Just mild blockages bilateral arteries of the neck, just something to monitor at this time  Zandra Abts MD

## 2021-08-05 ENCOUNTER — Encounter: Payer: Self-pay | Admitting: *Deleted

## 2021-08-05 DIAGNOSIS — Z992 Dependence on renal dialysis: Secondary | ICD-10-CM | POA: Diagnosis not present

## 2021-08-05 DIAGNOSIS — D509 Iron deficiency anemia, unspecified: Secondary | ICD-10-CM | POA: Diagnosis not present

## 2021-08-05 DIAGNOSIS — N186 End stage renal disease: Secondary | ICD-10-CM | POA: Diagnosis not present

## 2021-08-05 DIAGNOSIS — N2581 Secondary hyperparathyroidism of renal origin: Secondary | ICD-10-CM | POA: Diagnosis not present

## 2021-08-05 DIAGNOSIS — D631 Anemia in chronic kidney disease: Secondary | ICD-10-CM | POA: Diagnosis not present

## 2021-08-05 DIAGNOSIS — N250 Renal osteodystrophy: Secondary | ICD-10-CM | POA: Diagnosis not present

## 2021-08-05 NOTE — Telephone Encounter (Signed)
This encounter was created in error - please disregard.

## 2021-08-07 DIAGNOSIS — N2581 Secondary hyperparathyroidism of renal origin: Secondary | ICD-10-CM | POA: Diagnosis not present

## 2021-08-07 DIAGNOSIS — D509 Iron deficiency anemia, unspecified: Secondary | ICD-10-CM | POA: Diagnosis not present

## 2021-08-07 DIAGNOSIS — D631 Anemia in chronic kidney disease: Secondary | ICD-10-CM | POA: Diagnosis not present

## 2021-08-07 DIAGNOSIS — N186 End stage renal disease: Secondary | ICD-10-CM | POA: Diagnosis not present

## 2021-08-07 DIAGNOSIS — N250 Renal osteodystrophy: Secondary | ICD-10-CM | POA: Diagnosis not present

## 2021-08-07 DIAGNOSIS — Z992 Dependence on renal dialysis: Secondary | ICD-10-CM | POA: Diagnosis not present

## 2021-08-09 DIAGNOSIS — Z992 Dependence on renal dialysis: Secondary | ICD-10-CM | POA: Diagnosis not present

## 2021-08-09 DIAGNOSIS — N186 End stage renal disease: Secondary | ICD-10-CM | POA: Diagnosis not present

## 2021-08-10 DIAGNOSIS — N2581 Secondary hyperparathyroidism of renal origin: Secondary | ICD-10-CM | POA: Diagnosis not present

## 2021-08-10 DIAGNOSIS — N186 End stage renal disease: Secondary | ICD-10-CM | POA: Diagnosis not present

## 2021-08-10 DIAGNOSIS — Z992 Dependence on renal dialysis: Secondary | ICD-10-CM | POA: Diagnosis not present

## 2021-08-10 DIAGNOSIS — D631 Anemia in chronic kidney disease: Secondary | ICD-10-CM | POA: Diagnosis not present

## 2021-08-10 DIAGNOSIS — D509 Iron deficiency anemia, unspecified: Secondary | ICD-10-CM | POA: Diagnosis not present

## 2021-08-10 DIAGNOSIS — E559 Vitamin D deficiency, unspecified: Secondary | ICD-10-CM | POA: Diagnosis not present

## 2021-08-10 DIAGNOSIS — N250 Renal osteodystrophy: Secondary | ICD-10-CM | POA: Diagnosis not present

## 2021-08-12 DIAGNOSIS — N2581 Secondary hyperparathyroidism of renal origin: Secondary | ICD-10-CM | POA: Diagnosis not present

## 2021-08-12 DIAGNOSIS — D631 Anemia in chronic kidney disease: Secondary | ICD-10-CM | POA: Diagnosis not present

## 2021-08-12 DIAGNOSIS — N250 Renal osteodystrophy: Secondary | ICD-10-CM | POA: Diagnosis not present

## 2021-08-12 DIAGNOSIS — Z992 Dependence on renal dialysis: Secondary | ICD-10-CM | POA: Diagnosis not present

## 2021-08-12 DIAGNOSIS — N186 End stage renal disease: Secondary | ICD-10-CM | POA: Diagnosis not present

## 2021-08-12 DIAGNOSIS — D509 Iron deficiency anemia, unspecified: Secondary | ICD-10-CM | POA: Diagnosis not present

## 2021-08-14 DIAGNOSIS — Z992 Dependence on renal dialysis: Secondary | ICD-10-CM | POA: Diagnosis not present

## 2021-08-14 DIAGNOSIS — N2581 Secondary hyperparathyroidism of renal origin: Secondary | ICD-10-CM | POA: Diagnosis not present

## 2021-08-14 DIAGNOSIS — D509 Iron deficiency anemia, unspecified: Secondary | ICD-10-CM | POA: Diagnosis not present

## 2021-08-14 DIAGNOSIS — N186 End stage renal disease: Secondary | ICD-10-CM | POA: Diagnosis not present

## 2021-08-14 DIAGNOSIS — D631 Anemia in chronic kidney disease: Secondary | ICD-10-CM | POA: Diagnosis not present

## 2021-08-14 DIAGNOSIS — N250 Renal osteodystrophy: Secondary | ICD-10-CM | POA: Diagnosis not present

## 2021-08-17 DIAGNOSIS — Z992 Dependence on renal dialysis: Secondary | ICD-10-CM | POA: Diagnosis not present

## 2021-08-17 DIAGNOSIS — D509 Iron deficiency anemia, unspecified: Secondary | ICD-10-CM | POA: Diagnosis not present

## 2021-08-17 DIAGNOSIS — N186 End stage renal disease: Secondary | ICD-10-CM | POA: Diagnosis not present

## 2021-08-17 DIAGNOSIS — D631 Anemia in chronic kidney disease: Secondary | ICD-10-CM | POA: Diagnosis not present

## 2021-08-17 DIAGNOSIS — N2581 Secondary hyperparathyroidism of renal origin: Secondary | ICD-10-CM | POA: Diagnosis not present

## 2021-08-17 DIAGNOSIS — N250 Renal osteodystrophy: Secondary | ICD-10-CM | POA: Diagnosis not present

## 2021-08-18 ENCOUNTER — Encounter: Payer: Self-pay | Admitting: Family Medicine

## 2021-08-18 ENCOUNTER — Ambulatory Visit (INDEPENDENT_AMBULATORY_CARE_PROVIDER_SITE_OTHER): Payer: Medicare Other | Admitting: Family Medicine

## 2021-08-18 VITALS — BP 140/84 | HR 62 | Temp 97.6°F | Ht 70.0 in | Wt 199.1 lb

## 2021-08-18 DIAGNOSIS — E1165 Type 2 diabetes mellitus with hyperglycemia: Secondary | ICD-10-CM

## 2021-08-18 DIAGNOSIS — E1129 Type 2 diabetes mellitus with other diabetic kidney complication: Secondary | ICD-10-CM | POA: Diagnosis not present

## 2021-08-18 DIAGNOSIS — I6523 Occlusion and stenosis of bilateral carotid arteries: Secondary | ICD-10-CM

## 2021-08-18 LAB — POCT GLYCOSYLATED HEMOGLOBIN (HGB A1C): Hemoglobin A1C: 8.5 % — AB (ref 4.0–5.6)

## 2021-08-18 MED ORDER — BASAGLAR KWIKPEN 100 UNIT/ML ~~LOC~~ SOPN: 14.0000 [IU] | PEN_INJECTOR | Freq: Every day | SUBCUTANEOUS | 3 refills | Status: DC

## 2021-08-18 NOTE — Patient Instructions (Signed)
Continue with current sliding scale for the Humalog  Start Basaglar 14 units once daily and IF fasting blood sugars OVER 130 increase the Basaglar by 2 units every 3 days until you consistently get fasting < 130.

## 2021-08-18 NOTE — Progress Notes (Signed)
Established Patient Office Visit  Subjective   Patient ID: Anthony Bullock, male    DOB: 13-Nov-1960  Age: 61 y.o. MRN: [id number]  Chief Complaint  Patient presents with   Follow-up    HPI   Because of multiple chronic problems including history of end-stage renal disease on hemodialysis.  He has history of peripheral vascular disease, hypertension, type 2 diabetes, chronic systolic heart failure.  Doing reasonably well overall.  Regarding his blood sugars last A1c was 7.7%.  He had been taking Lantus insulin and apparently stopped this on his own several weeks ago as he states this was not working.  He apparently was using sliding scale for Humalog but also using sliding scale for his Lantus.  Still using Humalog 3 times daily with sliding scale.  He states his average blood sugar with checking 4 times daily over the past 7 days has been around 200.  Denies recent chest pains.  No dyspnea.  Recent lipids were fairly well-controlled.  Past Medical History:  Diagnosis Date   AICD (automatic cardioverter/defibrillator) present    [employer]   Allergic rhinitis    Anemia    Arthritis    Chronic systolic heart failure ([place])    a. ECHO (12/2012) EF 25-30%, HK entireanteroseptal myocardium //  b.  EF 25%, diffuse HK, grade 1 diastolic dysfunction, MAC, mild LAE, normal RVSF, trivial pericardial effusion   COPD (chronic obstructive pulmonary disease) ([place])    Diabetes mellitus type II    Diabetic nephropathy ([place])    Diabetic neuropathy ([place])    ESRD on hemodialysis ([place])    started HD June 2017, goes to [facility] HD unit, Dr Hinda Lenis   History of cardiac catheterization    a.Myoview 1/15:  There is significant left ventricular dysfunction. There may be slight scar at the apex. There is no significant ischemia. LV Ejection Fraction: 27%  //  b. RHC/LHC (1/15) with mean RA 6, PA 47/22 mean 33, mean PCWP 20, PVR 2.5 WU, CI 2.5; 80% dLAD stenosis, 70% diffuse large D.     History of  kidney stones    Hyperlipidemia    Hypertension    Kidney stones    NICM (nonischemic cardiomyopathy) ([place])    Primarily nonischemic.  Echo (12/14) with EF 25-30%.  Echo (3/15) with EF 25%, mild to moderately dilated LV, normal RV size and systolic function.     Osteomyelitis ([place])    left fifth ray   Pneumonia    Urethral stricture    Wears glasses    Past Surgical History:  Procedure Laterality Date   ABDOMINAL AORTOGRAM W/LOWER EXTREMITY N/A 03/30/2016   Procedure: Abdominal Aortogram w/Lower Extremity;  Surgeon: Angelia Mould, MD;  Location: [place] [facility];  Service: Cardiovascular;  Laterality: N/A;   AMPUTATION Right 04/26/2016   Procedure: Right Below Knee Amputation;  Surgeon: Newt Minion, MD;  Location: [place];  Service: Orthopedics;  Laterality: Right;   AMPUTATION Left 08/21/2019   Procedure: LEFT FOOT 5TH RAY AMPUTATION;  Surgeon: Newt Minion, MD;  Location: [place];  Service: Orthopedics;  Laterality: Left;   AMPUTATION Left 11/13/2019   Procedure: LEFT BELOW KNEE AMPUTATION;  Surgeon: Newt Minion, MD;  Location: [place];  Service: Orthopedics;  Laterality: Left;   AV FISTULA PLACEMENT Right 09/08/2015   Procedure: INSERTION OF 4-67m x 45cm  ARTERIOVENOUS (AV) GORE-TEX GRAFT RIGHT UPPER  ARM;  Surgeon: CAngelia Mould MD;  [place]  Service: Vascular;  Laterality: Right;   AV FISTULA PLACEMENT Left 01/14/2016   Procedure: CREATION OF LEFT UPPER ARM ARTERIOVENOUS FISTULA;  Surgeon: Angelia Mould, MD;  Location: [place];  Service: Vascular;  Laterality: Left;   [place] Right 08/22/2014   Procedure: RIGHT UPPER ARM [place];  Surgeon: Angelia Mould, MD;  Location: [place];  Service: Vascular;  Laterality: Right;   BELOW KNEE LEG AMPUTATION Right 04/26/2016   CARDIAC CATHETERIZATION     CARDIAC DEFIBRILLATOR PLACEMENT  06/27/2013   Sub Q       BY DR Caryl Comes   CATARACT EXTRACTION W/PHACO Right 08/06/2018    Procedure: CATARACT EXTRACTION PHACO AND INTRAOCULAR LENS PLACEMENT ([place]);  Surgeon: Baruch Goldmann, MD;  Location: [facility];  Service: Ophthalmology;  Laterality: Right;  CDE: 4.06   CATARACT EXTRACTION W/PHACO Left 08/20/2018   Procedure: CATARACT EXTRACTION PHACO AND INTRAOCULAR LENS PLACEMENT ([facility]);  Surgeon: Baruch Goldmann, MD;  Location: [facility];  Service: Ophthalmology;  Laterality: Left;  CDE: 6.76   COLONOSCOPY WITH PROPOFOL N/A 07/22/2015   Procedure: COLONOSCOPY WITH PROPOFOL;  Surgeon: Doran Stabler, MD;  [facility]: [facility];  Service: Gastroenterology;  Laterality: N/A;   FEMORAL-POPLITEAL BYPASS GRAFT Right 03/31/2016   Procedure: BYPASS GRAFT FEMORAL-POPLITEAL ARTERY USING RIGHT GREATER SAPHENOUS NONREVERSED VEIN;  Surgeon: Angelia Mould, MD;  Location: [place];  Service: Vascular;  Laterality: Right;   HERNIA REPAIR     I & D EXTREMITY Right 03/31/2016   Procedure: IRRIGATION AND DEBRIDEMENT FOOT;  Surgeon: Angelia Mould, MD;  Location: [place];  Service: Vascular;  Laterality: Right;   IMPLANTABLE CARDIOVERTER DEFIBRILLATOR IMPLANT N/A 06/27/2013   Procedure: SUB Q ICD;  Surgeon: Deboraha Sprang, MD;  Location: [facility];  Service: Cardiovascular;  Laterality: N/A;   INTRAOPERATIVE ARTERIOGRAM Right 03/31/2016   Procedure: INTRA OPERATIVE ARTERIOGRAM;  Surgeon: Angelia Mould, MD;  Location: [place];  Service: Vascular;  Laterality: Right;   IR GENERIC HISTORICAL Right 11/30/2015   IR THROMBECTOMY AV FISTULA W/THROMBOLYSIS/PTA INC/SHUNT/IMG RIGHT 11/30/2015 Aletta Edouard, MD [facility]   IR GENERIC HISTORICAL  11/30/2015   IR US GUIDE VASC ACCESS RIGHT 11/30/2015 Aletta Edouard, MD [facility]   IR GENERIC HISTORICAL Right 12/15/2015   IR THROMBECTOMY AV FISTULA W/THROMBOLYSIS/PTA/STENT INC/SHUNT/IMG RT 12/15/2015 Arne Cleveland, MD [facility]   IR GENERIC HISTORICAL  12/15/2015   IR US GUIDE VASC ACCESS RIGHT 12/15/2015 Arne Cleveland, MD [facility]    IR GENERIC HISTORICAL  12/28/2015   IR FLUORO GUIDE CV LINE RIGHT 12/28/2015 Marybelle Killings, MD [facility]   IR GENERIC HISTORICAL  12/28/2015   IR US GUIDE VASC ACCESS RIGHT 12/28/2015 Marybelle Killings, MD [facility]   LEFT A ND RIGHT HEART CATH  01/30/2013   DR Sung Amabile   LEFT AND RIGHT HEART CATHETERIZATION WITH CORONARY ANGIOGRAM N/A 01/30/2013   Procedure: LEFT AND RIGHT HEART CATHETERIZATION WITH CORONARY ANGIOGRAM;  Surgeon: Jolaine Artist, MD;  Location: [facility];  Service: Cardiovascular;  Laterality: N/A;   PERIPHERAL VASCULAR CATHETERIZATION Right 01/26/2015   Procedure: A/V Fistulagram;  Surgeon: Angelia Mould, MD;  Location: [place] [facility];  Service: Cardiovascular;  Laterality: Right;   reapea urethral surgery for recurrent obstruction  2011   TOTAL KNEE ARTHROPLASTY Right 2007   VEIN HARVEST Right 03/31/2016   Procedure: RIGHT GREATER SAPHENOUS VEIN HARVEST;  Surgeon: Angelia Mould, MD;  Location: [place];  Service: Vascular;  Laterality: Right;    reports  that he quit smoking about 11 years ago. His smoking use included cigarettes. He has a 64.00 pack-year smoking history. He has never used smokeless tobacco. He reports that he does not drink alcohol and does not use drugs. family history includes Alcohol abuse in his father; Bladder Cancer in his mother; Diabetes in his maternal grandmother; Heart Problems in his maternal grandmother; Heart disease in his maternal grandfather; Melanoma in his father; Prostate cancer in his maternal grandfather; Stroke in his maternal grandmother. Allergies  Allergen Reactions   Epoetin Alfa Other (See Comments)    unknown   Ferumoxytol Other (See Comments)    unknown   Morphine Sulfate Rash and Other (See Comments)    Itches all over, red spots    Review of Systems  Constitutional:  Negative for malaise/fatigue.  Eyes:  Negative for blurred vision.  Respiratory:  Negative for shortness of breath.   Cardiovascular:   Negative for chest pain.  Neurological:  Negative for dizziness, weakness and headaches.      Objective:     BP (!) 140/84 (BP Location: Left Arm, Patient Position: Sitting, Cuff Size: Normal)   Pulse 62   Temp 97.6 F (36.4 C) (Oral)   Ht _0  (1.778 m)   Wt 199 lb 1.6 oz (90.3 kg)   SpO2 98%   BMI 28.57 kg/m    Physical Exam Vitals reviewed.  Constitutional:      Appearance: Normal appearance.  Cardiovascular:     Rate and Rhythm: Normal rate and regular rhythm.  Pulmonary:     Effort: Pulmonary effort is normal.     Breath sounds: Normal breath sounds.  Musculoskeletal:     Comments: He had bilateral below-knee amputations  Neurological:     Mental Status: He is alert.      Results for orders placed or performed in visit on 08/18/21  POCT glycosylated hemoglobin (Hb A1C)  Result Value Ref Range   Hemoglobin A1C 8.5 (A) 4.0 - 5.6 %   HbA1c POC (<> result, manual entry)     HbA1c, POC (prediabetic range)     HbA1c, POC (controlled diabetic range)        The ASCVD Risk score (Arnett DK, et al., 2019) failed to calculate for the following reasons:   The valid total cholesterol range is 130 to 320 mg/dL    Assessment & Plan:   Problem List Items Addressed This Visit       Unprioritized   Poorly controlled type II diabetes mellitus with renal complication (HCC) - Primary   Relevant Medications   Insulin Glargine (BASAGLAR KWIKPEN) 100 UNIT/ML   Other Relevant Orders   POCT glycosylated hemoglobin (Hb A1C) (Completed)  A1c today has worsened to 8.5%.  Patient not taking any basal insulin currently.  He had been confused and was using what was basically his sliding scale for Humalog for his daily Lantus.  He feels like he had better response when taking Basaglar  -Continue current sliding scale insulin with Humalog -Start Basaglar 14 units once daily and titrate up 2 units every 3 days until fasting blood sugars consistently less than 130 -Set up 60-month follow-up  Return in about 3 months (around 11/18/2021).    BCarolann Littler MD

## 2021-08-19 DIAGNOSIS — N250 Renal osteodystrophy: Secondary | ICD-10-CM | POA: Diagnosis not present

## 2021-08-19 DIAGNOSIS — N186 End stage renal disease: Secondary | ICD-10-CM | POA: Diagnosis not present

## 2021-08-19 DIAGNOSIS — N2581 Secondary hyperparathyroidism of renal origin: Secondary | ICD-10-CM | POA: Diagnosis not present

## 2021-08-19 DIAGNOSIS — D631 Anemia in chronic kidney disease: Secondary | ICD-10-CM | POA: Diagnosis not present

## 2021-08-19 DIAGNOSIS — D509 Iron deficiency anemia, unspecified: Secondary | ICD-10-CM | POA: Diagnosis not present

## 2021-08-19 DIAGNOSIS — Z992 Dependence on renal dialysis: Secondary | ICD-10-CM | POA: Diagnosis not present

## 2021-08-21 DIAGNOSIS — D631 Anemia in chronic kidney disease: Secondary | ICD-10-CM | POA: Diagnosis not present

## 2021-08-21 DIAGNOSIS — N186 End stage renal disease: Secondary | ICD-10-CM | POA: Diagnosis not present

## 2021-08-21 DIAGNOSIS — N2581 Secondary hyperparathyroidism of renal origin: Secondary | ICD-10-CM | POA: Diagnosis not present

## 2021-08-21 DIAGNOSIS — D509 Iron deficiency anemia, unspecified: Secondary | ICD-10-CM | POA: Diagnosis not present

## 2021-08-21 DIAGNOSIS — N250 Renal osteodystrophy: Secondary | ICD-10-CM | POA: Diagnosis not present

## 2021-08-21 DIAGNOSIS — Z992 Dependence on renal dialysis: Secondary | ICD-10-CM | POA: Diagnosis not present

## 2021-08-23 ENCOUNTER — Ambulatory Visit: Payer: Medicare Other

## 2021-08-23 DIAGNOSIS — I10 Essential (primary) hypertension: Secondary | ICD-10-CM

## 2021-08-23 DIAGNOSIS — Z992 Dependence on renal dialysis: Secondary | ICD-10-CM

## 2021-08-23 DIAGNOSIS — E1129 Type 2 diabetes mellitus with other diabetic kidney complication: Secondary | ICD-10-CM

## 2021-08-23 NOTE — Chronic Care Management (AMB) (Signed)
Care Management    RN Visit Note  08/23/2021 Name: Anthony Bullock MRN: [id number] DOB: 1960/04/01  Subjective: Anthony Bullock is a 61 y.o. year old male who is a primary care patient of Burchette, Alinda Sierras, MD. The care management team was consulted for assistance with disease management and care coordination needs.    Engaged with patient by telephone for follow up visit in response to provider referral for case management and/or care coordination services.   Consent to Services:   Anthony Bullock was given information about Care Management services today including:  Care Management services includes personalized support from designated clinical staff supervised by his physician, including individualized plan of care and coordination with other care providers 24/7 contact phone numbers for assistance for urgent and routine care needs. The patient may stop case management services at any time by phone call to the office staff.  Patient agreed to services and consent obtained.   Assessment: Review of patient past medical history, allergies, medications, health status, including review of consultants reports, laboratory and other test data, was performed as part of comprehensive evaluation and provision of chronic care management services.   SDOH (Social Determinants of Health) assessments and interventions performed:    Care Plan  Allergies  Allergen Reactions   Epoetin Alfa Other (See Comments)    unknown   Ferumoxytol Other (See Comments)    unknown   Morphine Sulfate Rash and Other (See Comments)    Itches all over, red spots    Outpatient Encounter Medications as of 08/23/2021  Medication Sig   acetaminophen (TYLENOL) 325 MG tablet Take 1-2 tablets (325-650 mg total) by mouth every 4 (four) hours as needed for mild pain.   albuterol (VENTOLIN HFA) 108 (90 Base) MCG/ACT inhaler Inhale 2 puffs into the lungs every 4 (four) hours as needed for wheezing or shortness of breath.   aspirin  EC 81 MG tablet Take 81 mg by mouth daily.    atorvastatin (LIPITOR) 80 MG tablet TAKE 1 TABLET BY MOUTH AT BEDTIME   azelastine (ASTELIN) 0.1 % nasal spray Place 2 sprays into both nostrils 3 (three) times daily as needed for rhinitis. Use in each nostril as directed   benzonatate (TESSALON PERLES) 100 MG capsule 1-2 capsules up to twice daily as needed for cough   CALCITRIOL PO Take by mouth 3 (three) times a week. ONLY PROVIDED AT DIALYSIS   carvedilol (COREG) 12.5 MG tablet Take 1 tablet (12.5 mg total) by mouth 2 (two) times daily.   cinacalcet (SENSIPAR) 30 MG tablet Take 1 tablet (30 mg total) by mouth Every Tuesday,Thursday,and Saturday with dialysis.   Continuous Blood Gluc Sensor (FREESTYLE LIBRE 14 DAY SENSOR) MISC Use as directed   fenofibrate 160 MG tablet Take 1 tablet by mouth once daily   fexofenadine (ALLEGRA) 180 MG tablet Take 180 mg by mouth daily.   furosemide (LASIX) 40 MG tablet Take 3 tablets (120 mg total) by mouth daily.   gabapentin (NEURONTIN) 300 MG capsule Take 2 capsules (600 mg total) by mouth 2 (two) times daily. (Patient taking differently: Take 600 mg by mouth. Take 1 capsule every morning and 2 capsules every evening)   glucose blood test strip Check 1 time daily. E11.9 One Touch Ultra Blue Test Strips   HUMALOG KWIKPEN 200 UNIT/ML KwikPen USE TWICE DAILY WITH LUNCH AND SUPPER PER SLIDING SCALE (MAXIMUM DAILY DOSE OF 28 UNITS) . APPOINTMENT REQUIRED FOR FURTHER REFILLS   Insulin Glargine (BASAGLAR KWIKPEN) 100 UNIT/ML  Inject 14 Units into the skin daily.   Insulin Pen Needle (BD PEN NEEDLE NANO U/F) 32G X 4 MM MISC USE 1 PEN NEEDLE SUBCUTANEOUSLY WITH INSULIN 4 TIMES DAILY   lanthanum (FOSRENOL) 500 MG chewable tablet Chew 500-1,000 mg by mouth See admin instructions. Take 1000 mg with meals three time a day and 500 mg with snacks   midodrine (PROAMATINE) 10 MG tablet Take 10 mg by mouth 3 (three) times a week.   multivitamin (RENA-VIT) TABS tablet Take 1 tablet  by mouth once daily   Olopatadine HCl 0.2 % SOLN Place 1 drop into both eyes daily as needed (for allergies).    ondansetron (ZOFRAN ODT) 8 MG disintegrating tablet Take 1 tablet (8 mg total) by mouth every 8 (eight) hours as needed for nausea or vomiting.   No facility-administered encounter medications on file as of 08/23/2021.    Patient Active Problem List   Diagnosis Date Noted   Malnutrition of moderate degree 11/18/2019   Below-knee amputation of left lower extremity ([place]) 11/16/2019   Abscess of left foot 11/13/2019   Dehiscence of amputation stump ([place])    History of complete ray amputation of fifth toe of left foot ([place]) 10/09/2019   Acute pulmonary edema ([facility])    Osteomyelitis of fifth toe of left foot ([place])    Febrile illness 08/17/2019   SIRS (systemic inflammatory response syndrome) ([place]) 08/17/2019   Thrombocytopenia ([place]) 08/17/2019   Pressure injury of back, stage 2 ([place]) 08/17/2019   Hypertensive emergency 07/10/2019   Onychomycosis 08/16/2016   Osteopathy in diseases classified elsewhere, unspecified site 05/14/2016   S/P bilateral below knee amputation ([place]) 04/26/2016   Bilateral carotid bruits 03/30/2016   Diabetes mellitus with complication ([place])    Anemia due to chronic kidney disease    ESRD on dialysis ([place]) 11/30/2015   Acute respiratory failure with hypoxia ([place]) 11/29/2015   Dependence on renal dialysis ([place]) 08/15/2015   Presence of automatic (implantable) cardiac defibrillator 03/12/2015   At high risk for falls 09/29/2014   Peripheral vascular disease ([place]) 09/29/2014   Osteoarthritis of both knees 01/07/2014   Osteoarthritis of multiple joints 10/06/2013   NICM (nonischemic cardiomyopathy) ([place]) 06/27/2013   CAD (coronary artery disease) 03/13/2013   Abnormal nuclear stress test 86/38/1771   Chronic systolic congestive heart failure ([place]) 01/27/2013   COPD (chronic obstructive pulmonary disease) ([place]) 12/27/2012   Diabetic peripheral neuropathy  ([place]) 02/10/2012   Urethral stricture 10/06/2010   URINARY CALCULUS 09/28/2009   Adjustment disorder with depressed mood 08/21/2009   Poorly controlled type II diabetes mellitus with renal complication ([place]) 16/57/9038   Hyperlipidemia 04/14/2008   Essential hypertension 04/14/2008   Allergic rhinitis 04/14/2008    Conditions to be addressed/monitored: CHF, HTN, DMII, and ESRD  Care Plan : RN Care Manager Plan of Care  Updates made by Dimitri Ped, RN since 08/23/2021 12:00 AM  Completed 08/23/2021   Problem: Care Coordination Needs (DM2, CHF, CAD, ESRD, HTN, HLD) Resolved 08/23/2021  Priority: High     Long-Range Goal: Establish Plan of Care for Care Coordination Needs (DM2, CHF, CAD, ESRD, HTN, HLD) Completed 08/23/2021  Start Date: 03/29/2021  Expected End Date: 03/29/2022  Priority: High  Note:   Current Barriers:  Knowledge Deficits related to plan of care for management of CHF, CAD, HTN, HLD, DMII, ESRD, and frequent falls  Care Coordination needs related to ADL IADL limitations Chronic Disease Management support and education needs related to CHF, CAD, HTN, HLD, DMII, ESRD, and  frequent falls  States is taking his long acting insulin now every night.  States he is working on adjusting his sliding scale insulin.  States he is wearing his Freestyle Roland and scans 3-4 times a day.   States his blood sugars can range from 70-80 in the morning and 180-200 later in the day.  States he has had a couple to low readings usually in the morning which he treats with orange juice. States he has lower readings in morning is he did not eat as much for dinner. Denies any recent falls. States he does have balance problems and uses his cane when walking with his prostheses.  States his B/P runs around 140/82 but will be too low at dialysis. Denies any chest pains, shortness of breath.    RNCM Clinical Goal(s):  Patient will verbalize understanding of plan for management of CHF, CAD, HTN, HLD,  DMII, ESRD, and frequent falls as evidenced by voiced adherence to plan of care verbalize basic understanding of  CHF, CAD, HTN, HLD, DMII, ESRD, and frequent falls disease process and self health management plan as evidenced by voiced understanding and teach back take all medications exactly as prescribed and will call provider for medication related questions as evidenced by dispense report and pt verbalization  attend all scheduled medical appointments: cardiology 12/07/21, Annual Wellness Visit 01/24/22 as evidenced by medical records demonstrate Improved adherence to prescribed treatment plan for CHF, CAD, HTN, HLD, DMII, ESRD, and frequent falls as evidenced by voiced understanding and teach back dispense report and pt verbalization   through collaboration with RN Care manager, provider, and care team.   Interventions: 1:1 collaboration with primary care provider regarding development and update of comprehensive plan of care as evidenced by provider attestation and co-signature Inter-disciplinary care team collaboration (see longitudinal plan of care) Evaluation of current treatment plan related to  self management and patient's adherence to plan as established by provider   CAD Interventions: (Status:  Goal Met.) Long Term Goal Assessed understanding of CAD diagnosis Medications reviewed including medications utilized in CAD treatment plan Provided education on importance of blood pressure control in management of CAD Provided education on Importance of limiting foods high in cholesterol Counseled on importance of regular laboratory monitoring as prescribed Reviewed Importance of taking all medications as prescribed Reviewed Importance of attending all scheduled provider appointments Advised to report any changes in symptoms or exercise tolerance   Heart Failure Interventions:  (Status:  Goal Met.) Long Term Goal Basic overview and discussion of pathophysiology of Heart Failure  reviewed Provided education on low sodium diet Discussed importance of daily weight and advised patient to weigh and record daily Reviewed role of diuretics in prevention of fluid overload and management of heart failure; Revewed s/sx to call provider   Chronic Kidney Disease Interventions:  (Status:  Goal Met.) Long Term Goal Assessed the Patient understanding of chronic kidney disease    Evaluation of current treatment plan related to chronic kidney disease self management and patient's adherence to plan as established by provider      Provided education to patient re: stroke prevention, s/s of heart attack and stroke    Reviewed medications with patient and discussed importance of compliance    Advised patient, providing education and rationale, to monitor blood pressure daily and record, calling PCP for findings outside established parameters    Assessed social determinant of health barriers    Last practice recorded BP readings:  BP Readings from Last 3 Encounters:  08/18/21 [name])  140/84  05/20/21 140/80  05/17/21 124/64  Most recent eGFR/CrCl: No results found for: EGFR  No components found for: CRCL    Diabetes Interventions:  (Status:  Goal Met.) Long Term Goal Assessed patient's understanding of A1c goal: <7% Provided education to patient about basic DM disease process Reviewed medications with patient and discussed importance of medication adherence Counseled on importance of regular laboratory monitoring as prescribed Provided patient with written educational materials related to hypo and hyperglycemia and importance of correct treatment Advised patient, providing education and rationale, to check cbg 3-4 times a day and record, calling provider for findings outside established parameters Reviewed to take long acting insulin every day as ordered. Reviewed to eat a small snack at night is he does not eat as much dinner as planned Lab Results  Component Value Date   HGBA1C 7.7  (A) 05/17/2021   Falls Interventions:  (Status:  Goal Met.) Long Term Goal Provided written and verbal education re: potential causes of falls and Fall prevention strategies Reviewed medications and discussed potential side effects of medications such as dizziness and frequent urination Assessed for signs and symptoms of orthostatic hypotension Assessed for falls since last encounter Assessed patients knowledge of fall risk prevention secondary to previously provided education   Hyperlipidemia Interventions:  (Status:  Goal Met.) Long Term Goal Medication review performed; medication list updated in electronic medical record.  Provider established cholesterol goals reviewed Counseled on importance of regular laboratory monitoring as prescribed Reviewed role and benefits of statin for ASCVD risk reduction Reviewed importance of limiting foods high in cholesterol  Hypertension Interventions:  (Status:  Goal Met.) Long Term Goal Last practice recorded BP readings:  BP Readings from Last 3 Encounters:  08/18/21 (!) 140/84  05/20/21 140/80  05/17/21 124/64  Most recent eGFR/CrCl: No results found for: EGFR  No components found for: CRCL  Evaluation of current treatment plan related to hypertension self management and patient's adherence to plan as established by provider Provided education to patient re: stroke prevention, s/s of heart attack and stroke Reviewed medications with patient and discussed importance of compliance Discussed plans with patient for ongoing care management follow up and provided patient with direct contact information for care management team Advised patient, providing education and rationale, to monitor blood pressure daily and record, calling PCP for findings outside established parameters Provided education on prescribed diet low sodium, low CHO renal diet Discussed complications of poorly controlled blood pressure such as heart disease, stroke, circulatory  complications, vision complications, kidney impairment, sexual dysfunction  Patient Goals/Self-Care Activities: Take all medications as prescribed Attend all scheduled provider appointments Call pharmacy for medication refills 3-7 days in advance of running out of medications Call provider office for new concerns or questions  call office if I gain more than 2 pounds in one day or 5 pounds in one week weigh myself daily track symptoms and what helps feel better or worse keep appointment with eye doctor check blood sugar at prescribed times: before meals and at bedtime fill half of plate with vegetables manage portion size check blood pressure 3 times per week choose a place to take my blood pressure (home, clinic or office, retail store) keep a blood pressure log take blood pressure log to all doctor appointments call doctor for signs and symptoms of high blood pressure keep all doctor appointments call for medicine refill 2 or 3 days before it runs out take all medications exactly as prescribed call doctor with any symptoms you believe are  related to your medicine  Follow Up Plan:  The patient has been provided with contact information for the care management team and has been advised to call with any health related questions or concerns.  No further follow up required: Case closed goals met       Plan: The patient has been provided with contact information for the care management team and has been advised to call with any health related questions or concerns.  No further follow up required: Case closed goals met  Peter Garter RN, [facility], CDE Care Management Coordinator [facility] [phone]

## 2021-08-23 NOTE — Patient Instructions (Signed)
Visit Information Case closed goals met Thank you for allowing me to share the care management and care coordination services that are available to you as part of your health plan and services through your primary care provider and medical home. Please reach out to me at [phone] if the care management/care coordination team may be of assistance to you in the future.     RN, BSN,CCM, CDE Care Management Coordinator  [facility] [phone]   

## 2021-08-24 DIAGNOSIS — D509 Iron deficiency anemia, unspecified: Secondary | ICD-10-CM | POA: Diagnosis not present

## 2021-08-24 DIAGNOSIS — N250 Renal osteodystrophy: Secondary | ICD-10-CM | POA: Diagnosis not present

## 2021-08-24 DIAGNOSIS — D631 Anemia in chronic kidney disease: Secondary | ICD-10-CM | POA: Diagnosis not present

## 2021-08-24 DIAGNOSIS — N186 End stage renal disease: Secondary | ICD-10-CM | POA: Diagnosis not present

## 2021-08-24 DIAGNOSIS — Z992 Dependence on renal dialysis: Secondary | ICD-10-CM | POA: Diagnosis not present

## 2021-08-24 DIAGNOSIS — N2581 Secondary hyperparathyroidism of renal origin: Secondary | ICD-10-CM | POA: Diagnosis not present

## 2021-08-26 DIAGNOSIS — N250 Renal osteodystrophy: Secondary | ICD-10-CM | POA: Diagnosis not present

## 2021-08-26 DIAGNOSIS — N2581 Secondary hyperparathyroidism of renal origin: Secondary | ICD-10-CM | POA: Diagnosis not present

## 2021-08-26 DIAGNOSIS — N186 End stage renal disease: Secondary | ICD-10-CM | POA: Diagnosis not present

## 2021-08-26 DIAGNOSIS — Z992 Dependence on renal dialysis: Secondary | ICD-10-CM | POA: Diagnosis not present

## 2021-08-26 DIAGNOSIS — D631 Anemia in chronic kidney disease: Secondary | ICD-10-CM | POA: Diagnosis not present

## 2021-08-26 DIAGNOSIS — D509 Iron deficiency anemia, unspecified: Secondary | ICD-10-CM | POA: Diagnosis not present

## 2021-08-28 DIAGNOSIS — N186 End stage renal disease: Secondary | ICD-10-CM | POA: Diagnosis not present

## 2021-08-28 DIAGNOSIS — D509 Iron deficiency anemia, unspecified: Secondary | ICD-10-CM | POA: Diagnosis not present

## 2021-08-28 DIAGNOSIS — N250 Renal osteodystrophy: Secondary | ICD-10-CM | POA: Diagnosis not present

## 2021-08-28 DIAGNOSIS — D631 Anemia in chronic kidney disease: Secondary | ICD-10-CM | POA: Diagnosis not present

## 2021-08-28 DIAGNOSIS — N2581 Secondary hyperparathyroidism of renal origin: Secondary | ICD-10-CM | POA: Diagnosis not present

## 2021-08-28 DIAGNOSIS — Z992 Dependence on renal dialysis: Secondary | ICD-10-CM | POA: Diagnosis not present

## 2021-08-31 DIAGNOSIS — N2581 Secondary hyperparathyroidism of renal origin: Secondary | ICD-10-CM | POA: Diagnosis not present

## 2021-08-31 DIAGNOSIS — N186 End stage renal disease: Secondary | ICD-10-CM | POA: Diagnosis not present

## 2021-08-31 DIAGNOSIS — Z992 Dependence on renal dialysis: Secondary | ICD-10-CM | POA: Diagnosis not present

## 2021-08-31 DIAGNOSIS — D631 Anemia in chronic kidney disease: Secondary | ICD-10-CM | POA: Diagnosis not present

## 2021-08-31 DIAGNOSIS — N250 Renal osteodystrophy: Secondary | ICD-10-CM | POA: Diagnosis not present

## 2021-08-31 DIAGNOSIS — D509 Iron deficiency anemia, unspecified: Secondary | ICD-10-CM | POA: Diagnosis not present

## 2021-09-02 DIAGNOSIS — Z992 Dependence on renal dialysis: Secondary | ICD-10-CM | POA: Diagnosis not present

## 2021-09-02 DIAGNOSIS — N250 Renal osteodystrophy: Secondary | ICD-10-CM | POA: Diagnosis not present

## 2021-09-02 DIAGNOSIS — D509 Iron deficiency anemia, unspecified: Secondary | ICD-10-CM | POA: Diagnosis not present

## 2021-09-02 DIAGNOSIS — N2581 Secondary hyperparathyroidism of renal origin: Secondary | ICD-10-CM | POA: Diagnosis not present

## 2021-09-02 DIAGNOSIS — N186 End stage renal disease: Secondary | ICD-10-CM | POA: Diagnosis not present

## 2021-09-02 DIAGNOSIS — D631 Anemia in chronic kidney disease: Secondary | ICD-10-CM | POA: Diagnosis not present

## 2021-09-04 DIAGNOSIS — D509 Iron deficiency anemia, unspecified: Secondary | ICD-10-CM | POA: Diagnosis not present

## 2021-09-04 DIAGNOSIS — N2581 Secondary hyperparathyroidism of renal origin: Secondary | ICD-10-CM | POA: Diagnosis not present

## 2021-09-04 DIAGNOSIS — Z992 Dependence on renal dialysis: Secondary | ICD-10-CM | POA: Diagnosis not present

## 2021-09-04 DIAGNOSIS — N250 Renal osteodystrophy: Secondary | ICD-10-CM | POA: Diagnosis not present

## 2021-09-04 DIAGNOSIS — D631 Anemia in chronic kidney disease: Secondary | ICD-10-CM | POA: Diagnosis not present

## 2021-09-04 DIAGNOSIS — N186 End stage renal disease: Secondary | ICD-10-CM | POA: Diagnosis not present

## 2021-09-06 ENCOUNTER — Other Ambulatory Visit: Payer: Self-pay

## 2021-09-07 DIAGNOSIS — N186 End stage renal disease: Secondary | ICD-10-CM | POA: Diagnosis not present

## 2021-09-07 DIAGNOSIS — Z992 Dependence on renal dialysis: Secondary | ICD-10-CM | POA: Diagnosis not present

## 2021-09-07 DIAGNOSIS — D631 Anemia in chronic kidney disease: Secondary | ICD-10-CM | POA: Diagnosis not present

## 2021-09-07 DIAGNOSIS — D509 Iron deficiency anemia, unspecified: Secondary | ICD-10-CM | POA: Diagnosis not present

## 2021-09-07 DIAGNOSIS — N2581 Secondary hyperparathyroidism of renal origin: Secondary | ICD-10-CM | POA: Diagnosis not present

## 2021-09-07 DIAGNOSIS — N250 Renal osteodystrophy: Secondary | ICD-10-CM | POA: Diagnosis not present

## 2021-09-07 MED ORDER — LANTUS SOLOSTAR 100 UNIT/ML ~~LOC~~ SOPN: 14.0000 [IU] | PEN_INJECTOR | Freq: Every day | SUBCUTANEOUS | 1 refills | Status: DC

## 2021-09-09 DIAGNOSIS — N2581 Secondary hyperparathyroidism of renal origin: Secondary | ICD-10-CM | POA: Diagnosis not present

## 2021-09-09 DIAGNOSIS — N186 End stage renal disease: Secondary | ICD-10-CM | POA: Diagnosis not present

## 2021-09-09 DIAGNOSIS — D509 Iron deficiency anemia, unspecified: Secondary | ICD-10-CM | POA: Diagnosis not present

## 2021-09-09 DIAGNOSIS — D631 Anemia in chronic kidney disease: Secondary | ICD-10-CM | POA: Diagnosis not present

## 2021-09-09 DIAGNOSIS — Z992 Dependence on renal dialysis: Secondary | ICD-10-CM | POA: Diagnosis not present

## 2021-09-09 DIAGNOSIS — N250 Renal osteodystrophy: Secondary | ICD-10-CM | POA: Diagnosis not present

## 2021-09-11 DIAGNOSIS — N250 Renal osteodystrophy: Secondary | ICD-10-CM | POA: Diagnosis not present

## 2021-09-11 DIAGNOSIS — D509 Iron deficiency anemia, unspecified: Secondary | ICD-10-CM | POA: Diagnosis not present

## 2021-09-11 DIAGNOSIS — Z992 Dependence on renal dialysis: Secondary | ICD-10-CM | POA: Diagnosis not present

## 2021-09-11 DIAGNOSIS — N2581 Secondary hyperparathyroidism of renal origin: Secondary | ICD-10-CM | POA: Diagnosis not present

## 2021-09-11 DIAGNOSIS — E559 Vitamin D deficiency, unspecified: Secondary | ICD-10-CM | POA: Diagnosis not present

## 2021-09-11 DIAGNOSIS — N186 End stage renal disease: Secondary | ICD-10-CM | POA: Diagnosis not present

## 2021-09-11 DIAGNOSIS — D631 Anemia in chronic kidney disease: Secondary | ICD-10-CM | POA: Diagnosis not present

## 2021-09-14 DIAGNOSIS — Z992 Dependence on renal dialysis: Secondary | ICD-10-CM | POA: Diagnosis not present

## 2021-09-14 DIAGNOSIS — N186 End stage renal disease: Secondary | ICD-10-CM | POA: Diagnosis not present

## 2021-09-14 DIAGNOSIS — N2581 Secondary hyperparathyroidism of renal origin: Secondary | ICD-10-CM | POA: Diagnosis not present

## 2021-09-14 DIAGNOSIS — D509 Iron deficiency anemia, unspecified: Secondary | ICD-10-CM | POA: Diagnosis not present

## 2021-09-14 DIAGNOSIS — N250 Renal osteodystrophy: Secondary | ICD-10-CM | POA: Diagnosis not present

## 2021-09-15 DIAGNOSIS — T82898A Other specified complication of vascular prosthetic devices, implants and grafts, initial encounter: Secondary | ICD-10-CM | POA: Diagnosis not present

## 2021-09-15 DIAGNOSIS — N186 End stage renal disease: Secondary | ICD-10-CM | POA: Diagnosis not present

## 2021-09-15 DIAGNOSIS — Z992 Dependence on renal dialysis: Secondary | ICD-10-CM | POA: Diagnosis not present

## 2021-09-16 DIAGNOSIS — D509 Iron deficiency anemia, unspecified: Secondary | ICD-10-CM | POA: Diagnosis not present

## 2021-09-16 DIAGNOSIS — Z992 Dependence on renal dialysis: Secondary | ICD-10-CM | POA: Diagnosis not present

## 2021-09-16 DIAGNOSIS — N186 End stage renal disease: Secondary | ICD-10-CM | POA: Diagnosis not present

## 2021-09-16 DIAGNOSIS — N250 Renal osteodystrophy: Secondary | ICD-10-CM | POA: Diagnosis not present

## 2021-09-16 DIAGNOSIS — N2581 Secondary hyperparathyroidism of renal origin: Secondary | ICD-10-CM | POA: Diagnosis not present

## 2021-09-18 DIAGNOSIS — N250 Renal osteodystrophy: Secondary | ICD-10-CM | POA: Diagnosis not present

## 2021-09-18 DIAGNOSIS — D509 Iron deficiency anemia, unspecified: Secondary | ICD-10-CM | POA: Diagnosis not present

## 2021-09-18 DIAGNOSIS — Z992 Dependence on renal dialysis: Secondary | ICD-10-CM | POA: Diagnosis not present

## 2021-09-18 DIAGNOSIS — N186 End stage renal disease: Secondary | ICD-10-CM | POA: Diagnosis not present

## 2021-09-18 DIAGNOSIS — N2581 Secondary hyperparathyroidism of renal origin: Secondary | ICD-10-CM | POA: Diagnosis not present

## 2021-09-21 DIAGNOSIS — N250 Renal osteodystrophy: Secondary | ICD-10-CM | POA: Diagnosis not present

## 2021-09-21 DIAGNOSIS — N186 End stage renal disease: Secondary | ICD-10-CM | POA: Diagnosis not present

## 2021-09-21 DIAGNOSIS — N2581 Secondary hyperparathyroidism of renal origin: Secondary | ICD-10-CM | POA: Diagnosis not present

## 2021-09-21 DIAGNOSIS — Z992 Dependence on renal dialysis: Secondary | ICD-10-CM | POA: Diagnosis not present

## 2021-09-21 DIAGNOSIS — D509 Iron deficiency anemia, unspecified: Secondary | ICD-10-CM | POA: Diagnosis not present

## 2021-09-23 DIAGNOSIS — N186 End stage renal disease: Secondary | ICD-10-CM | POA: Diagnosis not present

## 2021-09-23 DIAGNOSIS — D509 Iron deficiency anemia, unspecified: Secondary | ICD-10-CM | POA: Diagnosis not present

## 2021-09-23 DIAGNOSIS — N250 Renal osteodystrophy: Secondary | ICD-10-CM | POA: Diagnosis not present

## 2021-09-23 DIAGNOSIS — Z992 Dependence on renal dialysis: Secondary | ICD-10-CM | POA: Diagnosis not present

## 2021-09-23 DIAGNOSIS — N2581 Secondary hyperparathyroidism of renal origin: Secondary | ICD-10-CM | POA: Diagnosis not present

## 2021-09-25 DIAGNOSIS — N2581 Secondary hyperparathyroidism of renal origin: Secondary | ICD-10-CM | POA: Diagnosis not present

## 2021-09-25 DIAGNOSIS — Z992 Dependence on renal dialysis: Secondary | ICD-10-CM | POA: Diagnosis not present

## 2021-09-25 DIAGNOSIS — N250 Renal osteodystrophy: Secondary | ICD-10-CM | POA: Diagnosis not present

## 2021-09-25 DIAGNOSIS — N186 End stage renal disease: Secondary | ICD-10-CM | POA: Diagnosis not present

## 2021-09-25 DIAGNOSIS — D509 Iron deficiency anemia, unspecified: Secondary | ICD-10-CM | POA: Diagnosis not present

## 2021-09-28 DIAGNOSIS — Z992 Dependence on renal dialysis: Secondary | ICD-10-CM | POA: Diagnosis not present

## 2021-09-28 DIAGNOSIS — D509 Iron deficiency anemia, unspecified: Secondary | ICD-10-CM | POA: Diagnosis not present

## 2021-09-28 DIAGNOSIS — N250 Renal osteodystrophy: Secondary | ICD-10-CM | POA: Diagnosis not present

## 2021-09-28 DIAGNOSIS — N2581 Secondary hyperparathyroidism of renal origin: Secondary | ICD-10-CM | POA: Diagnosis not present

## 2021-09-28 DIAGNOSIS — N186 End stage renal disease: Secondary | ICD-10-CM | POA: Diagnosis not present

## 2021-09-30 DIAGNOSIS — N250 Renal osteodystrophy: Secondary | ICD-10-CM | POA: Diagnosis not present

## 2021-09-30 DIAGNOSIS — N2581 Secondary hyperparathyroidism of renal origin: Secondary | ICD-10-CM | POA: Diagnosis not present

## 2021-09-30 DIAGNOSIS — D509 Iron deficiency anemia, unspecified: Secondary | ICD-10-CM | POA: Diagnosis not present

## 2021-09-30 DIAGNOSIS — N186 End stage renal disease: Secondary | ICD-10-CM | POA: Diagnosis not present

## 2021-09-30 DIAGNOSIS — Z992 Dependence on renal dialysis: Secondary | ICD-10-CM | POA: Diagnosis not present

## 2021-10-02 DIAGNOSIS — Z992 Dependence on renal dialysis: Secondary | ICD-10-CM | POA: Diagnosis not present

## 2021-10-02 DIAGNOSIS — D509 Iron deficiency anemia, unspecified: Secondary | ICD-10-CM | POA: Diagnosis not present

## 2021-10-02 DIAGNOSIS — N2581 Secondary hyperparathyroidism of renal origin: Secondary | ICD-10-CM | POA: Diagnosis not present

## 2021-10-02 DIAGNOSIS — N186 End stage renal disease: Secondary | ICD-10-CM | POA: Diagnosis not present

## 2021-10-02 DIAGNOSIS — N250 Renal osteodystrophy: Secondary | ICD-10-CM | POA: Diagnosis not present

## 2021-10-05 DIAGNOSIS — N186 End stage renal disease: Secondary | ICD-10-CM | POA: Diagnosis not present

## 2021-10-05 DIAGNOSIS — D509 Iron deficiency anemia, unspecified: Secondary | ICD-10-CM | POA: Diagnosis not present

## 2021-10-05 DIAGNOSIS — N2581 Secondary hyperparathyroidism of renal origin: Secondary | ICD-10-CM | POA: Diagnosis not present

## 2021-10-05 DIAGNOSIS — Z992 Dependence on renal dialysis: Secondary | ICD-10-CM | POA: Diagnosis not present

## 2021-10-05 DIAGNOSIS — N250 Renal osteodystrophy: Secondary | ICD-10-CM | POA: Diagnosis not present

## 2021-10-06 ENCOUNTER — Other Ambulatory Visit: Payer: Self-pay | Admitting: Family Medicine

## 2021-10-07 DIAGNOSIS — N186 End stage renal disease: Secondary | ICD-10-CM | POA: Diagnosis not present

## 2021-10-07 DIAGNOSIS — Z992 Dependence on renal dialysis: Secondary | ICD-10-CM | POA: Diagnosis not present

## 2021-10-07 DIAGNOSIS — N250 Renal osteodystrophy: Secondary | ICD-10-CM | POA: Diagnosis not present

## 2021-10-07 DIAGNOSIS — D509 Iron deficiency anemia, unspecified: Secondary | ICD-10-CM | POA: Diagnosis not present

## 2021-10-07 DIAGNOSIS — N2581 Secondary hyperparathyroidism of renal origin: Secondary | ICD-10-CM | POA: Diagnosis not present

## 2021-10-09 DIAGNOSIS — N2581 Secondary hyperparathyroidism of renal origin: Secondary | ICD-10-CM | POA: Diagnosis not present

## 2021-10-09 DIAGNOSIS — Z992 Dependence on renal dialysis: Secondary | ICD-10-CM | POA: Diagnosis not present

## 2021-10-09 DIAGNOSIS — N250 Renal osteodystrophy: Secondary | ICD-10-CM | POA: Diagnosis not present

## 2021-10-09 DIAGNOSIS — D509 Iron deficiency anemia, unspecified: Secondary | ICD-10-CM | POA: Diagnosis not present

## 2021-10-09 DIAGNOSIS — N186 End stage renal disease: Secondary | ICD-10-CM | POA: Diagnosis not present

## 2021-10-12 DIAGNOSIS — N186 End stage renal disease: Secondary | ICD-10-CM | POA: Diagnosis not present

## 2021-10-12 DIAGNOSIS — Z23 Encounter for immunization: Secondary | ICD-10-CM | POA: Diagnosis not present

## 2021-10-12 DIAGNOSIS — Z992 Dependence on renal dialysis: Secondary | ICD-10-CM | POA: Diagnosis not present

## 2021-10-12 DIAGNOSIS — D509 Iron deficiency anemia, unspecified: Secondary | ICD-10-CM | POA: Diagnosis not present

## 2021-10-12 DIAGNOSIS — D631 Anemia in chronic kidney disease: Secondary | ICD-10-CM | POA: Diagnosis not present

## 2021-10-12 DIAGNOSIS — N250 Renal osteodystrophy: Secondary | ICD-10-CM | POA: Diagnosis not present

## 2021-10-12 DIAGNOSIS — N2581 Secondary hyperparathyroidism of renal origin: Secondary | ICD-10-CM | POA: Diagnosis not present

## 2021-10-12 DIAGNOSIS — E559 Vitamin D deficiency, unspecified: Secondary | ICD-10-CM | POA: Diagnosis not present

## 2021-10-13 ENCOUNTER — Other Ambulatory Visit: Payer: Self-pay | Admitting: Family Medicine

## 2021-10-14 DIAGNOSIS — Z992 Dependence on renal dialysis: Secondary | ICD-10-CM | POA: Diagnosis not present

## 2021-10-14 DIAGNOSIS — N186 End stage renal disease: Secondary | ICD-10-CM | POA: Diagnosis not present

## 2021-10-14 DIAGNOSIS — N250 Renal osteodystrophy: Secondary | ICD-10-CM | POA: Diagnosis not present

## 2021-10-14 DIAGNOSIS — D509 Iron deficiency anemia, unspecified: Secondary | ICD-10-CM | POA: Diagnosis not present

## 2021-10-14 DIAGNOSIS — N2581 Secondary hyperparathyroidism of renal origin: Secondary | ICD-10-CM | POA: Diagnosis not present

## 2021-10-14 DIAGNOSIS — Z23 Encounter for immunization: Secondary | ICD-10-CM | POA: Diagnosis not present

## 2021-10-16 DIAGNOSIS — N186 End stage renal disease: Secondary | ICD-10-CM | POA: Diagnosis not present

## 2021-10-16 DIAGNOSIS — N250 Renal osteodystrophy: Secondary | ICD-10-CM | POA: Diagnosis not present

## 2021-10-16 DIAGNOSIS — Z992 Dependence on renal dialysis: Secondary | ICD-10-CM | POA: Diagnosis not present

## 2021-10-16 DIAGNOSIS — N2581 Secondary hyperparathyroidism of renal origin: Secondary | ICD-10-CM | POA: Diagnosis not present

## 2021-10-16 DIAGNOSIS — Z23 Encounter for immunization: Secondary | ICD-10-CM | POA: Diagnosis not present

## 2021-10-16 DIAGNOSIS — D509 Iron deficiency anemia, unspecified: Secondary | ICD-10-CM | POA: Diagnosis not present

## 2021-10-19 DIAGNOSIS — N2581 Secondary hyperparathyroidism of renal origin: Secondary | ICD-10-CM | POA: Diagnosis not present

## 2021-10-19 DIAGNOSIS — Z23 Encounter for immunization: Secondary | ICD-10-CM | POA: Diagnosis not present

## 2021-10-19 DIAGNOSIS — N186 End stage renal disease: Secondary | ICD-10-CM | POA: Diagnosis not present

## 2021-10-19 DIAGNOSIS — D509 Iron deficiency anemia, unspecified: Secondary | ICD-10-CM | POA: Diagnosis not present

## 2021-10-19 DIAGNOSIS — N250 Renal osteodystrophy: Secondary | ICD-10-CM | POA: Diagnosis not present

## 2021-10-19 DIAGNOSIS — Z992 Dependence on renal dialysis: Secondary | ICD-10-CM | POA: Diagnosis not present

## 2021-10-21 DIAGNOSIS — N250 Renal osteodystrophy: Secondary | ICD-10-CM | POA: Diagnosis not present

## 2021-10-21 DIAGNOSIS — N2581 Secondary hyperparathyroidism of renal origin: Secondary | ICD-10-CM | POA: Diagnosis not present

## 2021-10-21 DIAGNOSIS — Z23 Encounter for immunization: Secondary | ICD-10-CM | POA: Diagnosis not present

## 2021-10-21 DIAGNOSIS — D509 Iron deficiency anemia, unspecified: Secondary | ICD-10-CM | POA: Diagnosis not present

## 2021-10-21 DIAGNOSIS — Z992 Dependence on renal dialysis: Secondary | ICD-10-CM | POA: Diagnosis not present

## 2021-10-21 DIAGNOSIS — N186 End stage renal disease: Secondary | ICD-10-CM | POA: Diagnosis not present

## 2021-10-23 DIAGNOSIS — N2581 Secondary hyperparathyroidism of renal origin: Secondary | ICD-10-CM | POA: Diagnosis not present

## 2021-10-23 DIAGNOSIS — Z23 Encounter for immunization: Secondary | ICD-10-CM | POA: Diagnosis not present

## 2021-10-23 DIAGNOSIS — Z992 Dependence on renal dialysis: Secondary | ICD-10-CM | POA: Diagnosis not present

## 2021-10-23 DIAGNOSIS — N250 Renal osteodystrophy: Secondary | ICD-10-CM | POA: Diagnosis not present

## 2021-10-23 DIAGNOSIS — D509 Iron deficiency anemia, unspecified: Secondary | ICD-10-CM | POA: Diagnosis not present

## 2021-10-23 DIAGNOSIS — N186 End stage renal disease: Secondary | ICD-10-CM | POA: Diagnosis not present

## 2021-10-24 ENCOUNTER — Other Ambulatory Visit: Payer: Self-pay | Admitting: Family Medicine

## 2021-10-26 DIAGNOSIS — N2581 Secondary hyperparathyroidism of renal origin: Secondary | ICD-10-CM | POA: Diagnosis not present

## 2021-10-26 DIAGNOSIS — Z992 Dependence on renal dialysis: Secondary | ICD-10-CM | POA: Diagnosis not present

## 2021-10-26 DIAGNOSIS — Z23 Encounter for immunization: Secondary | ICD-10-CM | POA: Diagnosis not present

## 2021-10-26 DIAGNOSIS — N186 End stage renal disease: Secondary | ICD-10-CM | POA: Diagnosis not present

## 2021-10-26 DIAGNOSIS — N250 Renal osteodystrophy: Secondary | ICD-10-CM | POA: Diagnosis not present

## 2021-10-26 DIAGNOSIS — D509 Iron deficiency anemia, unspecified: Secondary | ICD-10-CM | POA: Diagnosis not present

## 2021-10-28 DIAGNOSIS — N2581 Secondary hyperparathyroidism of renal origin: Secondary | ICD-10-CM | POA: Diagnosis not present

## 2021-10-28 DIAGNOSIS — D509 Iron deficiency anemia, unspecified: Secondary | ICD-10-CM | POA: Diagnosis not present

## 2021-10-28 DIAGNOSIS — N186 End stage renal disease: Secondary | ICD-10-CM | POA: Diagnosis not present

## 2021-10-28 DIAGNOSIS — Z992 Dependence on renal dialysis: Secondary | ICD-10-CM | POA: Diagnosis not present

## 2021-10-28 DIAGNOSIS — Z23 Encounter for immunization: Secondary | ICD-10-CM | POA: Diagnosis not present

## 2021-10-28 DIAGNOSIS — N250 Renal osteodystrophy: Secondary | ICD-10-CM | POA: Diagnosis not present

## 2021-10-30 DIAGNOSIS — Z992 Dependence on renal dialysis: Secondary | ICD-10-CM | POA: Diagnosis not present

## 2021-10-30 DIAGNOSIS — N186 End stage renal disease: Secondary | ICD-10-CM | POA: Diagnosis not present

## 2021-10-30 DIAGNOSIS — Z23 Encounter for immunization: Secondary | ICD-10-CM | POA: Diagnosis not present

## 2021-10-30 DIAGNOSIS — N2581 Secondary hyperparathyroidism of renal origin: Secondary | ICD-10-CM | POA: Diagnosis not present

## 2021-10-30 DIAGNOSIS — N250 Renal osteodystrophy: Secondary | ICD-10-CM | POA: Diagnosis not present

## 2021-10-30 DIAGNOSIS — D509 Iron deficiency anemia, unspecified: Secondary | ICD-10-CM | POA: Diagnosis not present

## 2021-11-02 DIAGNOSIS — N2581 Secondary hyperparathyroidism of renal origin: Secondary | ICD-10-CM | POA: Diagnosis not present

## 2021-11-02 DIAGNOSIS — N250 Renal osteodystrophy: Secondary | ICD-10-CM | POA: Diagnosis not present

## 2021-11-02 DIAGNOSIS — Z992 Dependence on renal dialysis: Secondary | ICD-10-CM | POA: Diagnosis not present

## 2021-11-02 DIAGNOSIS — D509 Iron deficiency anemia, unspecified: Secondary | ICD-10-CM | POA: Diagnosis not present

## 2021-11-02 DIAGNOSIS — Z23 Encounter for immunization: Secondary | ICD-10-CM | POA: Diagnosis not present

## 2021-11-02 DIAGNOSIS — N186 End stage renal disease: Secondary | ICD-10-CM | POA: Diagnosis not present

## 2021-11-04 DIAGNOSIS — Z23 Encounter for immunization: Secondary | ICD-10-CM | POA: Diagnosis not present

## 2021-11-04 DIAGNOSIS — D509 Iron deficiency anemia, unspecified: Secondary | ICD-10-CM | POA: Diagnosis not present

## 2021-11-04 DIAGNOSIS — N186 End stage renal disease: Secondary | ICD-10-CM | POA: Diagnosis not present

## 2021-11-04 DIAGNOSIS — Z992 Dependence on renal dialysis: Secondary | ICD-10-CM | POA: Diagnosis not present

## 2021-11-04 DIAGNOSIS — N2581 Secondary hyperparathyroidism of renal origin: Secondary | ICD-10-CM | POA: Diagnosis not present

## 2021-11-04 DIAGNOSIS — N250 Renal osteodystrophy: Secondary | ICD-10-CM | POA: Diagnosis not present

## 2021-11-06 DIAGNOSIS — D509 Iron deficiency anemia, unspecified: Secondary | ICD-10-CM | POA: Diagnosis not present

## 2021-11-06 DIAGNOSIS — N2581 Secondary hyperparathyroidism of renal origin: Secondary | ICD-10-CM | POA: Diagnosis not present

## 2021-11-06 DIAGNOSIS — N186 End stage renal disease: Secondary | ICD-10-CM | POA: Diagnosis not present

## 2021-11-06 DIAGNOSIS — Z992 Dependence on renal dialysis: Secondary | ICD-10-CM | POA: Diagnosis not present

## 2021-11-06 DIAGNOSIS — Z23 Encounter for immunization: Secondary | ICD-10-CM | POA: Diagnosis not present

## 2021-11-06 DIAGNOSIS — N250 Renal osteodystrophy: Secondary | ICD-10-CM | POA: Diagnosis not present

## 2021-11-09 DIAGNOSIS — D509 Iron deficiency anemia, unspecified: Secondary | ICD-10-CM | POA: Diagnosis not present

## 2021-11-09 DIAGNOSIS — Z992 Dependence on renal dialysis: Secondary | ICD-10-CM | POA: Diagnosis not present

## 2021-11-09 DIAGNOSIS — Z23 Encounter for immunization: Secondary | ICD-10-CM | POA: Diagnosis not present

## 2021-11-09 DIAGNOSIS — N186 End stage renal disease: Secondary | ICD-10-CM | POA: Diagnosis not present

## 2021-11-09 DIAGNOSIS — N2581 Secondary hyperparathyroidism of renal origin: Secondary | ICD-10-CM | POA: Diagnosis not present

## 2021-11-09 DIAGNOSIS — N250 Renal osteodystrophy: Secondary | ICD-10-CM | POA: Diagnosis not present

## 2021-11-11 DIAGNOSIS — N250 Renal osteodystrophy: Secondary | ICD-10-CM | POA: Diagnosis not present

## 2021-11-11 DIAGNOSIS — D509 Iron deficiency anemia, unspecified: Secondary | ICD-10-CM | POA: Diagnosis not present

## 2021-11-11 DIAGNOSIS — N186 End stage renal disease: Secondary | ICD-10-CM | POA: Diagnosis not present

## 2021-11-11 DIAGNOSIS — D631 Anemia in chronic kidney disease: Secondary | ICD-10-CM | POA: Diagnosis not present

## 2021-11-11 DIAGNOSIS — N2581 Secondary hyperparathyroidism of renal origin: Secondary | ICD-10-CM | POA: Diagnosis not present

## 2021-11-11 DIAGNOSIS — E559 Vitamin D deficiency, unspecified: Secondary | ICD-10-CM | POA: Diagnosis not present

## 2021-11-11 DIAGNOSIS — Z992 Dependence on renal dialysis: Secondary | ICD-10-CM | POA: Diagnosis not present

## 2021-11-13 DIAGNOSIS — N186 End stage renal disease: Secondary | ICD-10-CM | POA: Diagnosis not present

## 2021-11-13 DIAGNOSIS — Z992 Dependence on renal dialysis: Secondary | ICD-10-CM | POA: Diagnosis not present

## 2021-11-13 DIAGNOSIS — N250 Renal osteodystrophy: Secondary | ICD-10-CM | POA: Diagnosis not present

## 2021-11-13 DIAGNOSIS — D631 Anemia in chronic kidney disease: Secondary | ICD-10-CM | POA: Diagnosis not present

## 2021-11-13 DIAGNOSIS — D509 Iron deficiency anemia, unspecified: Secondary | ICD-10-CM | POA: Diagnosis not present

## 2021-11-13 DIAGNOSIS — N2581 Secondary hyperparathyroidism of renal origin: Secondary | ICD-10-CM | POA: Diagnosis not present

## 2021-11-15 ENCOUNTER — Telehealth: Payer: Self-pay | Admitting: Family Medicine

## 2021-11-15 MED ORDER — ALBUTEROL SULFATE HFA 108 (90 BASE) MCG/ACT IN AERS: 2.0000 | INHALATION_SPRAY | RESPIRATORY_TRACT | 1 refills | Status: DC | PRN

## 2021-11-15 NOTE — Telephone Encounter (Signed)
Patient would like refill on albuterol (VENTOLIN HFA) 108 (90 Base) MCG/ACT inhaler       Please send to  [facility], [address] Phone: [phone]  Fax: [phone]         Please advise

## 2021-11-15 NOTE — Telephone Encounter (Signed)
Rx sent 

## 2021-11-16 DIAGNOSIS — Z992 Dependence on renal dialysis: Secondary | ICD-10-CM | POA: Diagnosis not present

## 2021-11-16 DIAGNOSIS — D509 Iron deficiency anemia, unspecified: Secondary | ICD-10-CM | POA: Diagnosis not present

## 2021-11-16 DIAGNOSIS — N250 Renal osteodystrophy: Secondary | ICD-10-CM | POA: Diagnosis not present

## 2021-11-16 DIAGNOSIS — D631 Anemia in chronic kidney disease: Secondary | ICD-10-CM | POA: Diagnosis not present

## 2021-11-16 DIAGNOSIS — N186 End stage renal disease: Secondary | ICD-10-CM | POA: Diagnosis not present

## 2021-11-16 DIAGNOSIS — N2581 Secondary hyperparathyroidism of renal origin: Secondary | ICD-10-CM | POA: Diagnosis not present

## 2021-11-18 DIAGNOSIS — N2581 Secondary hyperparathyroidism of renal origin: Secondary | ICD-10-CM | POA: Diagnosis not present

## 2021-11-18 DIAGNOSIS — D509 Iron deficiency anemia, unspecified: Secondary | ICD-10-CM | POA: Diagnosis not present

## 2021-11-18 DIAGNOSIS — N186 End stage renal disease: Secondary | ICD-10-CM | POA: Diagnosis not present

## 2021-11-18 DIAGNOSIS — N250 Renal osteodystrophy: Secondary | ICD-10-CM | POA: Diagnosis not present

## 2021-11-18 DIAGNOSIS — D631 Anemia in chronic kidney disease: Secondary | ICD-10-CM | POA: Diagnosis not present

## 2021-11-18 DIAGNOSIS — Z992 Dependence on renal dialysis: Secondary | ICD-10-CM | POA: Diagnosis not present

## 2021-11-20 DIAGNOSIS — N250 Renal osteodystrophy: Secondary | ICD-10-CM | POA: Diagnosis not present

## 2021-11-20 DIAGNOSIS — D509 Iron deficiency anemia, unspecified: Secondary | ICD-10-CM | POA: Diagnosis not present

## 2021-11-20 DIAGNOSIS — N186 End stage renal disease: Secondary | ICD-10-CM | POA: Diagnosis not present

## 2021-11-20 DIAGNOSIS — N2581 Secondary hyperparathyroidism of renal origin: Secondary | ICD-10-CM | POA: Diagnosis not present

## 2021-11-20 DIAGNOSIS — D631 Anemia in chronic kidney disease: Secondary | ICD-10-CM | POA: Diagnosis not present

## 2021-11-20 DIAGNOSIS — Z992 Dependence on renal dialysis: Secondary | ICD-10-CM | POA: Diagnosis not present

## 2021-11-23 DIAGNOSIS — D631 Anemia in chronic kidney disease: Secondary | ICD-10-CM | POA: Diagnosis not present

## 2021-11-23 DIAGNOSIS — N2581 Secondary hyperparathyroidism of renal origin: Secondary | ICD-10-CM | POA: Diagnosis not present

## 2021-11-23 DIAGNOSIS — D509 Iron deficiency anemia, unspecified: Secondary | ICD-10-CM | POA: Diagnosis not present

## 2021-11-23 DIAGNOSIS — Z992 Dependence on renal dialysis: Secondary | ICD-10-CM | POA: Diagnosis not present

## 2021-11-23 DIAGNOSIS — N186 End stage renal disease: Secondary | ICD-10-CM | POA: Diagnosis not present

## 2021-11-23 DIAGNOSIS — N250 Renal osteodystrophy: Secondary | ICD-10-CM | POA: Diagnosis not present

## 2021-11-25 DIAGNOSIS — D631 Anemia in chronic kidney disease: Secondary | ICD-10-CM | POA: Diagnosis not present

## 2021-11-25 DIAGNOSIS — N2581 Secondary hyperparathyroidism of renal origin: Secondary | ICD-10-CM | POA: Diagnosis not present

## 2021-11-25 DIAGNOSIS — N186 End stage renal disease: Secondary | ICD-10-CM | POA: Diagnosis not present

## 2021-11-25 DIAGNOSIS — Z992 Dependence on renal dialysis: Secondary | ICD-10-CM | POA: Diagnosis not present

## 2021-11-25 DIAGNOSIS — N250 Renal osteodystrophy: Secondary | ICD-10-CM | POA: Diagnosis not present

## 2021-11-25 DIAGNOSIS — D509 Iron deficiency anemia, unspecified: Secondary | ICD-10-CM | POA: Diagnosis not present

## 2021-11-27 DIAGNOSIS — D509 Iron deficiency anemia, unspecified: Secondary | ICD-10-CM | POA: Diagnosis not present

## 2021-11-27 DIAGNOSIS — Z992 Dependence on renal dialysis: Secondary | ICD-10-CM | POA: Diagnosis not present

## 2021-11-27 DIAGNOSIS — N2581 Secondary hyperparathyroidism of renal origin: Secondary | ICD-10-CM | POA: Diagnosis not present

## 2021-11-27 DIAGNOSIS — D631 Anemia in chronic kidney disease: Secondary | ICD-10-CM | POA: Diagnosis not present

## 2021-11-27 DIAGNOSIS — N186 End stage renal disease: Secondary | ICD-10-CM | POA: Diagnosis not present

## 2021-11-27 DIAGNOSIS — N250 Renal osteodystrophy: Secondary | ICD-10-CM | POA: Diagnosis not present

## 2021-11-30 DIAGNOSIS — N250 Renal osteodystrophy: Secondary | ICD-10-CM | POA: Diagnosis not present

## 2021-11-30 DIAGNOSIS — N186 End stage renal disease: Secondary | ICD-10-CM | POA: Diagnosis not present

## 2021-11-30 DIAGNOSIS — Z992 Dependence on renal dialysis: Secondary | ICD-10-CM | POA: Diagnosis not present

## 2021-11-30 DIAGNOSIS — N2581 Secondary hyperparathyroidism of renal origin: Secondary | ICD-10-CM | POA: Diagnosis not present

## 2021-11-30 DIAGNOSIS — D509 Iron deficiency anemia, unspecified: Secondary | ICD-10-CM | POA: Diagnosis not present

## 2021-11-30 DIAGNOSIS — D631 Anemia in chronic kidney disease: Secondary | ICD-10-CM | POA: Diagnosis not present

## 2021-12-03 DIAGNOSIS — N186 End stage renal disease: Secondary | ICD-10-CM | POA: Diagnosis not present

## 2021-12-03 DIAGNOSIS — N250 Renal osteodystrophy: Secondary | ICD-10-CM | POA: Diagnosis not present

## 2021-12-03 DIAGNOSIS — Z992 Dependence on renal dialysis: Secondary | ICD-10-CM | POA: Diagnosis not present

## 2021-12-03 DIAGNOSIS — N2581 Secondary hyperparathyroidism of renal origin: Secondary | ICD-10-CM | POA: Diagnosis not present

## 2021-12-03 DIAGNOSIS — D631 Anemia in chronic kidney disease: Secondary | ICD-10-CM | POA: Diagnosis not present

## 2021-12-03 DIAGNOSIS — D509 Iron deficiency anemia, unspecified: Secondary | ICD-10-CM | POA: Diagnosis not present

## 2021-12-04 DIAGNOSIS — N250 Renal osteodystrophy: Secondary | ICD-10-CM | POA: Diagnosis not present

## 2021-12-04 DIAGNOSIS — N2581 Secondary hyperparathyroidism of renal origin: Secondary | ICD-10-CM | POA: Diagnosis not present

## 2021-12-04 DIAGNOSIS — Z992 Dependence on renal dialysis: Secondary | ICD-10-CM | POA: Diagnosis not present

## 2021-12-04 DIAGNOSIS — N186 End stage renal disease: Secondary | ICD-10-CM | POA: Diagnosis not present

## 2021-12-04 DIAGNOSIS — D509 Iron deficiency anemia, unspecified: Secondary | ICD-10-CM | POA: Diagnosis not present

## 2021-12-04 DIAGNOSIS — D631 Anemia in chronic kidney disease: Secondary | ICD-10-CM | POA: Diagnosis not present

## 2021-12-07 ENCOUNTER — Encounter: Payer: Self-pay | Admitting: Cardiology

## 2021-12-07 ENCOUNTER — Ambulatory Visit: Payer: Medicare Other | Attending: Cardiology | Admitting: Cardiology

## 2021-12-07 VITALS — BP 90/60 | HR 78 | Ht 70.0 in | Wt 198.4 lb

## 2021-12-07 DIAGNOSIS — I10 Essential (primary) hypertension: Secondary | ICD-10-CM | POA: Insufficient documentation

## 2021-12-07 DIAGNOSIS — E782 Mixed hyperlipidemia: Secondary | ICD-10-CM | POA: Diagnosis not present

## 2021-12-07 DIAGNOSIS — Z992 Dependence on renal dialysis: Secondary | ICD-10-CM | POA: Diagnosis not present

## 2021-12-07 DIAGNOSIS — N186 End stage renal disease: Secondary | ICD-10-CM | POA: Diagnosis not present

## 2021-12-07 DIAGNOSIS — I2510 Atherosclerotic heart disease of native coronary artery without angina pectoris: Secondary | ICD-10-CM | POA: Diagnosis not present

## 2021-12-07 DIAGNOSIS — D631 Anemia in chronic kidney disease: Secondary | ICD-10-CM | POA: Diagnosis not present

## 2021-12-07 DIAGNOSIS — Z8679 Personal history of other diseases of the circulatory system: Secondary | ICD-10-CM | POA: Diagnosis not present

## 2021-12-07 DIAGNOSIS — N2581 Secondary hyperparathyroidism of renal origin: Secondary | ICD-10-CM | POA: Diagnosis not present

## 2021-12-07 DIAGNOSIS — D509 Iron deficiency anemia, unspecified: Secondary | ICD-10-CM | POA: Diagnosis not present

## 2021-12-07 DIAGNOSIS — N250 Renal osteodystrophy: Secondary | ICD-10-CM | POA: Diagnosis not present

## 2021-12-07 DIAGNOSIS — I6523 Occlusion and stenosis of bilateral carotid arteries: Secondary | ICD-10-CM | POA: Diagnosis not present

## 2021-12-07 NOTE — Progress Notes (Signed)
Clinical Summary Mr. Rill is a 61 y.o.male seen today for follow up of the following medical problems.      1. Chronic systolic HF/NICM - echo in 2015 LVEF 25%. echo 08/2015 LVEF has improved to 50-55%. - has had some of his CHF meds decreased due to low bp's on HD.  - has ICD, since LVEF recovered did not have gen change and now inactive.     08/2016 echo LVEF 55%. , grade I diasotlic function.   08/2019 echo LVEF 50%, grade Idd  - Fluid managed by HD  - no SOB/DOE, no edema - compliant with meds         2. ESRD - takes midodrine 30m before HD.Ongoing issues with low bp's. Does not take coreg before HD        3. HTN - scaled back on bp meds due to low bp's on HD - currently just on coreg.  - just finishded dialysis, lower bp's today.    4. CAD - cath 2015 with diffuse nonobstructive disease, worst lesion 80% small distal LAD.    - no recent chest pains.      5. Hyperlipidemia - Jan 2019 TC 103 TG 136 HDL 38 LDL 38 04/2019 TG 187 HDL 37 LDL 84  06/2020 TC 101 HDL 37 TG 140 LDL 42   05/2021 TC 114 TG 128 HDL 36 LDL 52     6. DM2 - followed by pcp   7. Carotid stenosis - in 2019 mild bilateral disease 07/2021 mild 1-39% bilateral disease.  Past Medical History:  Diagnosis Date   AICD (automatic cardioverter/defibrillator) present    [employer]   Allergic rhinitis    Anemia    Arthritis    Chronic systolic heart failure ([place]    a. ECHO (12/2012) EF 25-30%, HK entireanteroseptal myocardium //  b.  EF 25%, diffuse HK, grade 1 diastolic dysfunction, MAC, mild LAE, normal RVSF, trivial pericardial effusion   COPD (chronic obstructive pulmonary disease) ([place]    Diabetes mellitus type II    Diabetic nephropathy ([place]    Diabetic neuropathy ([place]    [place]    [facility]  History of cardiac catheterization    a.Myoview 1/15:  There is significant left ventricular dysfunction.  There may be slight scar at the apex. There is no significant ischemia. LV Ejection Fraction: 27%  //  b. RHC/LHC (1/15) with mean RA 6, PA 47/22 mean 33, mean PCWP 20, PVR 2.5 WU, CI 2.5; 80% dLAD stenosis, 70% diffuse large D.     History of kidney stones    Hyperlipidemia    Hypertension    Kidney stones    NICM (nonischemic cardiomyopathy) ([place]    Primarily nonischemic.  Echo (12/14) with EF 25-30%.  Echo (3/15) with EF 25%, mild to moderately dilated LV, normal RV size and systolic function.     Osteomyelitis ([place]    left fifth ray   Pneumonia    Urethral stricture    Wears glasses      Allergies  Allergen Reactions   Epoetin Alfa Other (See Comments)    unknown   Ferumoxytol Other (See Comments)    unknown   Morphine Sulfate Rash and Other (See Comments)    Itches all over, red spots     Current Outpatient Medications  Medication Sig Dispense Refill   acetaminophen (TYLENOL) 325 MG tablet Take  1-2 tablets (325-650 mg total) by mouth every 4 (four) hours as needed for mild pain.     albuterol (VENTOLIN HFA) 108 (90 Base) MCG/ACT inhaler Inhale 2 puffs into the lungs every 4 (four) hours as needed for wheezing or shortness of breath. 1 each 1   aspirin EC 81 MG tablet Take 81 mg by mouth daily.      atorvastatin (LIPITOR) 80 MG tablet TAKE 1 TABLET BY MOUTH AT BEDTIME 90 tablet 0   azelastine (ASTELIN) 0.1 % nasal spray Place 2 sprays into both nostrils 3 (three) times daily as needed for rhinitis. Use in each nostril as directed 30 mL 12   benzonatate (TESSALON PERLES) 100 MG capsule 1-2 capsules up to twice daily as needed for cough 30 capsule 0   CALCITRIOL PO Take by mouth 3 (three) times a week. ONLY PROVIDED AT DIALYSIS     carvedilol (COREG) 12.5 MG tablet Take 1 tablet by mouth twice daily 180 tablet 0   cinacalcet (SENSIPAR) 30 MG tablet Take 1 tablet (30 mg total) by mouth Every Tuesday,Thursday,and Saturday with dialysis. 60 tablet    Continuous Blood Gluc Sensor  (FREESTYLE LIBRE 14 DAY SENSOR) MISC Use as directed 4 each 11   fenofibrate 160 MG tablet Take 1 tablet by mouth once daily 90 tablet 0   fexofenadine (ALLEGRA) 180 MG tablet Take 180 mg by mouth daily.     furosemide (LASIX) 40 MG tablet Take 3 tablets (120 mg total) by mouth daily. 270 tablet 3   gabapentin (NEURONTIN) 300 MG capsule Take 2 capsules (600 mg total) by mouth 2 (two) times daily. (Patient taking differently: Take 600 mg by mouth. Take 1 capsule every morning and 2 capsules every evening) 360 capsule 1   glucose blood test strip Check 1 time daily. E11.9 One Touch Ultra Blue Test Strips 100 each 3   HUMALOG KWIKPEN 200 UNIT/ML KwikPen INJECT TWICE DAILY WITH LUNCH AND SUPPER PER SLIDING SCALE (MAXIMUM DAILY DOSE 28 UNITS) APPOINTMENT NEEDED FOR REFILLS 6 mL 0   insulin glargine (LANTUS SOLOSTAR) 100 UNIT/ML Solostar Pen Inject 14 Units into the skin daily. 15 mL 1   Insulin Pen Needle (BD PEN NEEDLE NANO U/F) 32G X 4 MM MISC USE 1 PEN NEEDLE SUBCUTANEOUSLY WITH INSULIN 4 TIMES DAILY 400 each 0   lanthanum (FOSRENOL) 500 MG chewable tablet Chew 500-1,000 mg by mouth See admin instructions. Take 1000 mg with meals three time a day and 500 mg with snacks     midodrine (PROAMATINE) 10 MG tablet Take 10 mg by mouth 3 (three) times a week.     multivitamin (RENA-VIT) TABS tablet Take 1 tablet by mouth once daily 10 tablet 3   Olopatadine HCl 0.2 % SOLN Place 1 drop into both eyes daily as needed (for allergies).      ondansetron (ZOFRAN ODT) 8 MG disintegrating tablet Take 1 tablet (8 mg total) by mouth every 8 (eight) hours as needed for nausea or vomiting. 15 tablet 0   No current facility-administered medications for this visit.     Past Surgical History:  Procedure Laterality Date   ABDOMINAL AORTOGRAM W/LOWER EXTREMITY N/A 03/30/2016   Procedure: Abdominal Aortogram w/Lower Extremity;  Surgeon: Angelia Mould, MD;  Location: [facility];  Service: Cardiovascular;   Laterality: N/A;   AMPUTATION Right 04/26/2016   Procedure: Right Below Knee Amputation;  Surgeon: Newt Minion, MD;  Location: [place];  Service: Orthopedics;  Laterality: Right;  AMPUTATION Left 08/21/2019   Procedure: LEFT FOOT 5TH RAY AMPUTATION;  Surgeon: Newt Minion, MD;  Location: [place];  Service: Orthopedics;  Laterality: Left;   AMPUTATION Left 11/13/2019   Procedure: LEFT BELOW KNEE AMPUTATION;  Surgeon: Newt Minion, MD;  Location: [place];  Service: Orthopedics;  Laterality: Left;   AV FISTULA PLACEMENT Right 09/08/2015   Procedure: INSERTION OF 4-26m x 45cm  ARTERIOVENOUS (AV) GORE-TEX GRAFT RIGHT UPPER  ARM;  Surgeon: CAngelia Mould MD;  [place]  Service: Vascular;  Laterality: Right;   AV FISTULA PLACEMENT Left 01/14/2016   Procedure: CREATION OF LEFT UPPER ARM ARTERIOVENOUS FISTULA;  Surgeon: CAngelia Mould MD;  [place]  Service: Vascular;  Laterality: Left;   [place]Grand [place]verse[place] 08/22/2014   Procedure: RIGHT UPPER ARM [place]  Surgeon: CAngelia Mould MD;  [place]  Service: Vascular;  Laterality: Right;   BELOW KNEE LEG AMPUTATION Right 04/26/2016   CARDIAC CATHETERIZATION     CARDIAC DEFIBRILLATOR PLACEMENT  06/27/2013   Sub Q       BY DR KCaryl Comes  CATARACT EXTRACTION W/PHACO Right 08/06/2018   Procedure: CATARACT EXTRACTION PHACO AND INTRAOCULAR LENS PLACEMENT ([place];  Surgeon: WBaruch Goldmann MD;  [facility];  Service: Ophthalmology;  Laterality: Right;  CDE: 4.06   CATARACT EXTRACTION W/PHACO Left 08/20/2018   Procedure: CATARACT EXTRACTION PHACO AND INTRAOCULAR LENS PLACEMENT (IOC);  Surgeon: WBaruch Goldmann MD;  [facility];  Service: Ophthalmology;  Laterality: Left;  CDE: 6.76   COLONOSCOPY WITH PROPOFOL N/A 07/22/2015   Procedure: COLONOSCOPY WITH PROPOFOL;  Surgeon: HDoran Stabler MD;  [facility];  Service: Gastroenterology;  Laterality: N/A;   FEMORAL-POPLITEAL  BYPASS GRAFT Right 03/31/2016   Procedure: BYPASS GRAFT FEMORAL-POPLITEAL ARTERY USING RIGHT GREATER SAPHENOUS NONREVERSED VEIN;  Surgeon: CAngelia Mould MD;  [place]  Service: Vascular;  Laterality: Right;   HERNIA REPAIR     I & D EXTREMITY Right 03/31/2016   Procedure: IRRIGATION AND DEBRIDEMENT FOOT;  Surgeon: CAngelia Mould MD;  [place]  Service: Vascular;  Laterality: Right;   IMPLANTABLE CARDIOVERTER DEFIBRILLATOR IMPLANT N/A 06/27/2013   Procedure: SUB Q ICD;  Surgeon: SDeboraha Sprang MD;  [facility];  Service: Cardiovascular;  Laterality: N/A;   INTRAOPERATIVE ARTERIOGRAM Right 03/31/2016   Procedure: INTRA OPERATIVE ARTERIOGRAM;  Surgeon: CAngelia Mould MD;  [place]  Service: Vascular;  Laterality: Right;   IR GENERIC HISTORICAL Right 11/30/2015   IR THROMBECTOMY AV FISTULA W/THROMBOLYSIS/PTA INC/SHUNT/IMG RIGHT 11/30/2015 GAletta Edouard MD [facility]   IR GENERIC HISTORICAL  11/30/2015   IR [place] ACCESS RIGHT 11/30/2015 GAletta Edouard MD [facility]   IR GENERIC HISTORICAL Right 12/15/2015   IR THROMBECTOMY AV FISTULA W/THROMBOLYSIS/PTA/STENT INC/SHUNT/IMG RT 12/15/2015 DArne Cleveland MD [facility]   IR GENERIC HISTORICAL  12/15/2015   IR [place] V[place] ACCESS RIGHT 12/15/2015 DArne Cleveland MD [facility]   IR GENERIC HISTORICAL  12/28/2015   IR FLUORO GUIDE CV LINE RIGHT 12/28/2015 AMarybelle Killings MD [facility]   IR GENERIC HISTORICAL  12/28/2015   IR [place] V[place] ACCESS RIGHT 12/28/2015 AMarybelle Killings MD [facility]   LEFT A ND RIGHT HEART CATH  01/30/2013   DR BSung Amabile  LEFT AND RIGHT HEART CATHETERIZATION WITH CORONARY ANGIOGRAM N/A 01/30/2013   Procedure: LEFT AND RIGHT HEART CATHETERIZATION WITH CORONARY ANGIOGRAM;  Surgeon: DJolaine Artist MD;  Location: [facility];  Service: Cardiovascular;  Laterality: N/A;   PERIPHERAL VASCULAR CATHETERIZATION Right 01/26/2015   Procedure: A/V Fistulagram;   Surgeon: Angelia Mould, MD;  Location: [facility];  Service: Cardiovascular;  Laterality: Right;   reapea urethral surgery for recurrent obstruction  2011   TOTAL KNEE ARTHROPLASTY Right 2007   VEIN HARVEST Right 03/31/2016   Procedure: RIGHT GREATER SAPHENOUS VEIN HARVEST;  Surgeon: Angelia Mould, MD;  Location: [place];  Service: Vascular;  Laterality: Right;     Allergies  Allergen Reactions   Epoetin Alfa Other (See Comments)    unknown   Ferumoxytol Other (See Comments)    unknown   Morphine Sulfate Rash and Other (See Comments)    Itches all over, red spots      Family History  Problem Relation Age of Onset   Bladder Cancer Mother    Alcohol abuse Father    Melanoma Father    Stroke Maternal Grandmother    Heart Problems Maternal Grandmother        unknown   Diabetes Maternal Grandmother    Heart disease Maternal Grandfather    Prostate cancer Maternal Grandfather      Social History Mr. Balingit reports that he quit smoking about 11 years ago. His smoking use included cigarettes. He has a 64.00 pack-year smoking history. He has never used smokeless tobacco. Mr. Lehenbauer reports no history of alcohol use.   Review of Systems CONSTITUTIONAL: No weight loss, fever, chills, weakness or fatigue.  HEENT: Eyes: No visual loss, blurred vision, double vision or yellow sclerae.No hearing loss, sneezing, congestion, runny nose or sore throat.  SKIN: No rash or itching.  CARDIOVASCULAR: per hpi RESPIRATORY: No shortness of breath, cough or sputum.  GASTROINTESTINAL: No anorexia, nausea, vomiting or diarrhea. No abdominal pain or blood.  GENITOURINARY: No burning on urination, no polyuria NEUROLOGICAL: No headache, dizziness, syncope, paralysis, ataxia, numbness or tingling in the extremities. No change in bowel or bladder control.  MUSCULOSKELETAL: No muscle, back pain, joint pain or stiffness.  LYMPHATICS: No enlarged nodes. No history of splenectomy.   PSYCHIATRIC: No history of depression or anxiety.  ENDOCRINOLOGIC: No reports of sweating, cold or heat intolerance. No polyuria or polydipsia.  [name]   Physical Examination Today's Vitals   12/07/21 1046  BP: 90/60  Pulse: 78  SpO2: 97%  Weight: 198 lb 6.4 oz (90 kg)  Height: _0  (1.778 m)   Body mass index is 28.47 kg/m.  Gen: resting comfortably, no acute distress HEENT: no scleral icterus, pupils equal round and reactive, no palptable cervical adenopathy,  CV: RRR, no m/rg, no jvd Resp: Clear to auscultation bilaterally GI: abdomen is soft, non-tender, non-distended, normal bowel sounds, no hepatosplenomegaly MSK: extremities are warm, no edema.  Skin: warm, no rash Neuro:  no focal deficits Psych: appropriate affect   Diagnostic Studies  08/2015 echo Study Conclusions   - Left ventricle: The cavity size was normal. Systolic function was   normal. The estimated ejection fraction was in the range of 50%   to 55%. Wall motion was normal; there were no regional wall   motion abnormalities. Doppler parameters are consistent with   abnormal left ventricular relaxation (grade 1 diastolic   dysfunction). - Left atrium: The atrium was mildly dilated. - Atrial septum: No defect or patent foramen ovale was identified.   Jan 2015 cath Findings:   RA = 6 RV = 48/5/7 PA =  47/22 (33) PCW = 20 Fick  cardiac output/index = 5.2/2.5 PVR = 2.5 WU SVR = 1192 FA sat = 98% PA sat = 65%, 68%   Ao Pressure: 116/64 (84) LV Pressure: 122/13/18 There was no signficant gradient across the aortic valve on pullback.   Left main: Mild ostial tapering otherwise ok   LAD: Narrow vessel (due to diffuse diabetic vasculopathy). 30% ostial. 40-50% tubular lesion  in the mid section. 80% lesion distally. Large diagonal with moderate diffuse disease and 70% lesion in midsection   LCX: Non dominant vessel with diffuse diabetic vasculopathy. Large OM-1. Small OM-2 and OM-3. 2 PLs. 30% mid  AV groove CX lesion. 40% lesion in proximal OM-1   RCA: Large dominant vessel with just mild plaque   Assessment: 1. CAD with diffuse diabetic vasculopathy. Only high grade lesion is in small distal LAD 2. Well-compensated hemodynamics    Assessment and Plan  1. History of cardiomyopathy.  - LVEF has now normalized - medical therapy has been limited due to low bp's during HD, as well as dizziness - fluid managed by HD - euvolemic today without symptoms, continue current meds   2. HTN - issues with low bp's during HD, accepting higher bp's for him - soft bp's today but just finsished HD and is common pattern for him - continue current meds   3. CAD - nonobstructive disease by prior cath.  - no symptoms, continue current meds  4. Carotid stenosis - mild bilateral disease by [name], continue to monitor.    5. Hyperlipidemia -at goal, continue current meds      Arnoldo Lenis, M.D.

## 2021-12-07 NOTE — Patient Instructions (Signed)
Medication Instructions:  Continue all current medications.   Labwork: none  Testing/Procedures: none  Follow-Up: 6 months   Any Other Special Instructions Will Be Listed Below (If Applicable).   If you need a refill on your cardiac medications before your next appointment, please call your pharmacy.  

## 2021-12-09 DIAGNOSIS — N2581 Secondary hyperparathyroidism of renal origin: Secondary | ICD-10-CM | POA: Diagnosis not present

## 2021-12-09 DIAGNOSIS — D631 Anemia in chronic kidney disease: Secondary | ICD-10-CM | POA: Diagnosis not present

## 2021-12-09 DIAGNOSIS — N186 End stage renal disease: Secondary | ICD-10-CM | POA: Diagnosis not present

## 2021-12-09 DIAGNOSIS — D509 Iron deficiency anemia, unspecified: Secondary | ICD-10-CM | POA: Diagnosis not present

## 2021-12-09 DIAGNOSIS — Z992 Dependence on renal dialysis: Secondary | ICD-10-CM | POA: Diagnosis not present

## 2021-12-09 DIAGNOSIS — N250 Renal osteodystrophy: Secondary | ICD-10-CM | POA: Diagnosis not present

## 2021-12-11 DIAGNOSIS — E559 Vitamin D deficiency, unspecified: Secondary | ICD-10-CM | POA: Diagnosis not present

## 2021-12-11 DIAGNOSIS — N186 End stage renal disease: Secondary | ICD-10-CM | POA: Diagnosis not present

## 2021-12-11 DIAGNOSIS — D631 Anemia in chronic kidney disease: Secondary | ICD-10-CM | POA: Diagnosis not present

## 2021-12-11 DIAGNOSIS — N250 Renal osteodystrophy: Secondary | ICD-10-CM | POA: Diagnosis not present

## 2021-12-11 DIAGNOSIS — Z992 Dependence on renal dialysis: Secondary | ICD-10-CM | POA: Diagnosis not present

## 2021-12-11 DIAGNOSIS — N2581 Secondary hyperparathyroidism of renal origin: Secondary | ICD-10-CM | POA: Diagnosis not present

## 2021-12-11 DIAGNOSIS — D509 Iron deficiency anemia, unspecified: Secondary | ICD-10-CM | POA: Diagnosis not present

## 2021-12-14 DIAGNOSIS — Z992 Dependence on renal dialysis: Secondary | ICD-10-CM | POA: Diagnosis not present

## 2021-12-14 DIAGNOSIS — N186 End stage renal disease: Secondary | ICD-10-CM | POA: Diagnosis not present

## 2021-12-14 DIAGNOSIS — D509 Iron deficiency anemia, unspecified: Secondary | ICD-10-CM | POA: Diagnosis not present

## 2021-12-14 DIAGNOSIS — N2581 Secondary hyperparathyroidism of renal origin: Secondary | ICD-10-CM | POA: Diagnosis not present

## 2021-12-14 DIAGNOSIS — N250 Renal osteodystrophy: Secondary | ICD-10-CM | POA: Diagnosis not present

## 2021-12-16 DIAGNOSIS — N250 Renal osteodystrophy: Secondary | ICD-10-CM | POA: Diagnosis not present

## 2021-12-16 DIAGNOSIS — N186 End stage renal disease: Secondary | ICD-10-CM | POA: Diagnosis not present

## 2021-12-16 DIAGNOSIS — D509 Iron deficiency anemia, unspecified: Secondary | ICD-10-CM | POA: Diagnosis not present

## 2021-12-16 DIAGNOSIS — Z992 Dependence on renal dialysis: Secondary | ICD-10-CM | POA: Diagnosis not present

## 2021-12-16 DIAGNOSIS — N2581 Secondary hyperparathyroidism of renal origin: Secondary | ICD-10-CM | POA: Diagnosis not present

## 2021-12-18 DIAGNOSIS — N2581 Secondary hyperparathyroidism of renal origin: Secondary | ICD-10-CM | POA: Diagnosis not present

## 2021-12-18 DIAGNOSIS — N186 End stage renal disease: Secondary | ICD-10-CM | POA: Diagnosis not present

## 2021-12-18 DIAGNOSIS — D509 Iron deficiency anemia, unspecified: Secondary | ICD-10-CM | POA: Diagnosis not present

## 2021-12-18 DIAGNOSIS — N250 Renal osteodystrophy: Secondary | ICD-10-CM | POA: Diagnosis not present

## 2021-12-18 DIAGNOSIS — Z992 Dependence on renal dialysis: Secondary | ICD-10-CM | POA: Diagnosis not present

## 2021-12-21 DIAGNOSIS — Z992 Dependence on renal dialysis: Secondary | ICD-10-CM | POA: Diagnosis not present

## 2021-12-21 DIAGNOSIS — N186 End stage renal disease: Secondary | ICD-10-CM | POA: Diagnosis not present

## 2021-12-21 DIAGNOSIS — N250 Renal osteodystrophy: Secondary | ICD-10-CM | POA: Diagnosis not present

## 2021-12-21 DIAGNOSIS — D509 Iron deficiency anemia, unspecified: Secondary | ICD-10-CM | POA: Diagnosis not present

## 2021-12-21 DIAGNOSIS — N2581 Secondary hyperparathyroidism of renal origin: Secondary | ICD-10-CM | POA: Diagnosis not present

## 2021-12-23 DIAGNOSIS — N2581 Secondary hyperparathyroidism of renal origin: Secondary | ICD-10-CM | POA: Diagnosis not present

## 2021-12-23 DIAGNOSIS — Z992 Dependence on renal dialysis: Secondary | ICD-10-CM | POA: Diagnosis not present

## 2021-12-23 DIAGNOSIS — N250 Renal osteodystrophy: Secondary | ICD-10-CM | POA: Diagnosis not present

## 2021-12-23 DIAGNOSIS — N186 End stage renal disease: Secondary | ICD-10-CM | POA: Diagnosis not present

## 2021-12-23 DIAGNOSIS — D509 Iron deficiency anemia, unspecified: Secondary | ICD-10-CM | POA: Diagnosis not present

## 2021-12-25 DIAGNOSIS — D509 Iron deficiency anemia, unspecified: Secondary | ICD-10-CM | POA: Diagnosis not present

## 2021-12-25 DIAGNOSIS — Z992 Dependence on renal dialysis: Secondary | ICD-10-CM | POA: Diagnosis not present

## 2021-12-25 DIAGNOSIS — N2581 Secondary hyperparathyroidism of renal origin: Secondary | ICD-10-CM | POA: Diagnosis not present

## 2021-12-25 DIAGNOSIS — N250 Renal osteodystrophy: Secondary | ICD-10-CM | POA: Diagnosis not present

## 2021-12-25 DIAGNOSIS — N186 End stage renal disease: Secondary | ICD-10-CM | POA: Diagnosis not present

## 2021-12-28 DIAGNOSIS — N250 Renal osteodystrophy: Secondary | ICD-10-CM | POA: Diagnosis not present

## 2021-12-28 DIAGNOSIS — N2581 Secondary hyperparathyroidism of renal origin: Secondary | ICD-10-CM | POA: Diagnosis not present

## 2021-12-28 DIAGNOSIS — N186 End stage renal disease: Secondary | ICD-10-CM | POA: Diagnosis not present

## 2021-12-28 DIAGNOSIS — Z992 Dependence on renal dialysis: Secondary | ICD-10-CM | POA: Diagnosis not present

## 2021-12-28 DIAGNOSIS — D509 Iron deficiency anemia, unspecified: Secondary | ICD-10-CM | POA: Diagnosis not present

## 2021-12-30 DIAGNOSIS — D509 Iron deficiency anemia, unspecified: Secondary | ICD-10-CM | POA: Diagnosis not present

## 2021-12-30 DIAGNOSIS — Z992 Dependence on renal dialysis: Secondary | ICD-10-CM | POA: Diagnosis not present

## 2021-12-30 DIAGNOSIS — N250 Renal osteodystrophy: Secondary | ICD-10-CM | POA: Diagnosis not present

## 2021-12-30 DIAGNOSIS — N2581 Secondary hyperparathyroidism of renal origin: Secondary | ICD-10-CM | POA: Diagnosis not present

## 2021-12-30 DIAGNOSIS — N186 End stage renal disease: Secondary | ICD-10-CM | POA: Diagnosis not present

## 2022-01-01 DIAGNOSIS — N2581 Secondary hyperparathyroidism of renal origin: Secondary | ICD-10-CM | POA: Diagnosis not present

## 2022-01-01 DIAGNOSIS — N186 End stage renal disease: Secondary | ICD-10-CM | POA: Diagnosis not present

## 2022-01-01 DIAGNOSIS — Z992 Dependence on renal dialysis: Secondary | ICD-10-CM | POA: Diagnosis not present

## 2022-01-01 DIAGNOSIS — D509 Iron deficiency anemia, unspecified: Secondary | ICD-10-CM | POA: Diagnosis not present

## 2022-01-01 DIAGNOSIS — N250 Renal osteodystrophy: Secondary | ICD-10-CM | POA: Diagnosis not present

## 2022-01-04 DIAGNOSIS — Z992 Dependence on renal dialysis: Secondary | ICD-10-CM | POA: Diagnosis not present

## 2022-01-04 DIAGNOSIS — N186 End stage renal disease: Secondary | ICD-10-CM | POA: Diagnosis not present

## 2022-01-04 DIAGNOSIS — N2581 Secondary hyperparathyroidism of renal origin: Secondary | ICD-10-CM | POA: Diagnosis not present

## 2022-01-04 DIAGNOSIS — N250 Renal osteodystrophy: Secondary | ICD-10-CM | POA: Diagnosis not present

## 2022-01-04 DIAGNOSIS — D509 Iron deficiency anemia, unspecified: Secondary | ICD-10-CM | POA: Diagnosis not present

## 2022-01-06 DIAGNOSIS — N250 Renal osteodystrophy: Secondary | ICD-10-CM | POA: Diagnosis not present

## 2022-01-06 DIAGNOSIS — N186 End stage renal disease: Secondary | ICD-10-CM | POA: Diagnosis not present

## 2022-01-06 DIAGNOSIS — D509 Iron deficiency anemia, unspecified: Secondary | ICD-10-CM | POA: Diagnosis not present

## 2022-01-06 DIAGNOSIS — Z992 Dependence on renal dialysis: Secondary | ICD-10-CM | POA: Diagnosis not present

## 2022-01-06 DIAGNOSIS — N2581 Secondary hyperparathyroidism of renal origin: Secondary | ICD-10-CM | POA: Diagnosis not present

## 2022-01-08 DIAGNOSIS — Z992 Dependence on renal dialysis: Secondary | ICD-10-CM | POA: Diagnosis not present

## 2022-01-08 DIAGNOSIS — N2581 Secondary hyperparathyroidism of renal origin: Secondary | ICD-10-CM | POA: Diagnosis not present

## 2022-01-08 DIAGNOSIS — N250 Renal osteodystrophy: Secondary | ICD-10-CM | POA: Diagnosis not present

## 2022-01-08 DIAGNOSIS — N186 End stage renal disease: Secondary | ICD-10-CM | POA: Diagnosis not present

## 2022-01-08 DIAGNOSIS — D509 Iron deficiency anemia, unspecified: Secondary | ICD-10-CM | POA: Diagnosis not present

## 2022-01-09 DIAGNOSIS — Z992 Dependence on renal dialysis: Secondary | ICD-10-CM | POA: Diagnosis not present

## 2022-01-09 DIAGNOSIS — N186 End stage renal disease: Secondary | ICD-10-CM | POA: Diagnosis not present

## 2022-01-11 ENCOUNTER — Other Ambulatory Visit: Payer: Self-pay | Admitting: Family Medicine

## 2022-01-11 DIAGNOSIS — Z992 Dependence on renal dialysis: Secondary | ICD-10-CM | POA: Diagnosis not present

## 2022-01-11 DIAGNOSIS — N186 End stage renal disease: Secondary | ICD-10-CM | POA: Diagnosis not present

## 2022-01-11 DIAGNOSIS — E559 Vitamin D deficiency, unspecified: Secondary | ICD-10-CM | POA: Diagnosis not present

## 2022-01-11 DIAGNOSIS — D631 Anemia in chronic kidney disease: Secondary | ICD-10-CM | POA: Diagnosis not present

## 2022-01-11 DIAGNOSIS — N2581 Secondary hyperparathyroidism of renal origin: Secondary | ICD-10-CM | POA: Diagnosis not present

## 2022-01-11 DIAGNOSIS — D509 Iron deficiency anemia, unspecified: Secondary | ICD-10-CM | POA: Diagnosis not present

## 2022-01-11 DIAGNOSIS — N250 Renal osteodystrophy: Secondary | ICD-10-CM | POA: Diagnosis not present

## 2022-01-13 DIAGNOSIS — N250 Renal osteodystrophy: Secondary | ICD-10-CM | POA: Diagnosis not present

## 2022-01-13 DIAGNOSIS — D509 Iron deficiency anemia, unspecified: Secondary | ICD-10-CM | POA: Diagnosis not present

## 2022-01-13 DIAGNOSIS — Z992 Dependence on renal dialysis: Secondary | ICD-10-CM | POA: Diagnosis not present

## 2022-01-13 DIAGNOSIS — N2581 Secondary hyperparathyroidism of renal origin: Secondary | ICD-10-CM | POA: Diagnosis not present

## 2022-01-13 DIAGNOSIS — D631 Anemia in chronic kidney disease: Secondary | ICD-10-CM | POA: Diagnosis not present

## 2022-01-13 DIAGNOSIS — N186 End stage renal disease: Secondary | ICD-10-CM | POA: Diagnosis not present

## 2022-01-15 DIAGNOSIS — D509 Iron deficiency anemia, unspecified: Secondary | ICD-10-CM | POA: Diagnosis not present

## 2022-01-15 DIAGNOSIS — N250 Renal osteodystrophy: Secondary | ICD-10-CM | POA: Diagnosis not present

## 2022-01-15 DIAGNOSIS — D631 Anemia in chronic kidney disease: Secondary | ICD-10-CM | POA: Diagnosis not present

## 2022-01-15 DIAGNOSIS — Z992 Dependence on renal dialysis: Secondary | ICD-10-CM | POA: Diagnosis not present

## 2022-01-15 DIAGNOSIS — N186 End stage renal disease: Secondary | ICD-10-CM | POA: Diagnosis not present

## 2022-01-15 DIAGNOSIS — N2581 Secondary hyperparathyroidism of renal origin: Secondary | ICD-10-CM | POA: Diagnosis not present

## 2022-01-16 ENCOUNTER — Other Ambulatory Visit: Payer: Self-pay | Admitting: Family Medicine

## 2022-01-18 DIAGNOSIS — N250 Renal osteodystrophy: Secondary | ICD-10-CM | POA: Diagnosis not present

## 2022-01-18 DIAGNOSIS — D509 Iron deficiency anemia, unspecified: Secondary | ICD-10-CM | POA: Diagnosis not present

## 2022-01-18 DIAGNOSIS — N2581 Secondary hyperparathyroidism of renal origin: Secondary | ICD-10-CM | POA: Diagnosis not present

## 2022-01-18 DIAGNOSIS — N186 End stage renal disease: Secondary | ICD-10-CM | POA: Diagnosis not present

## 2022-01-18 DIAGNOSIS — D631 Anemia in chronic kidney disease: Secondary | ICD-10-CM | POA: Diagnosis not present

## 2022-01-18 DIAGNOSIS — Z992 Dependence on renal dialysis: Secondary | ICD-10-CM | POA: Diagnosis not present

## 2022-01-20 DIAGNOSIS — D509 Iron deficiency anemia, unspecified: Secondary | ICD-10-CM | POA: Diagnosis not present

## 2022-01-20 DIAGNOSIS — N250 Renal osteodystrophy: Secondary | ICD-10-CM | POA: Diagnosis not present

## 2022-01-20 DIAGNOSIS — D631 Anemia in chronic kidney disease: Secondary | ICD-10-CM | POA: Diagnosis not present

## 2022-01-20 DIAGNOSIS — N186 End stage renal disease: Secondary | ICD-10-CM | POA: Diagnosis not present

## 2022-01-20 DIAGNOSIS — Z992 Dependence on renal dialysis: Secondary | ICD-10-CM | POA: Diagnosis not present

## 2022-01-20 DIAGNOSIS — N2581 Secondary hyperparathyroidism of renal origin: Secondary | ICD-10-CM | POA: Diagnosis not present

## 2022-01-22 DIAGNOSIS — N186 End stage renal disease: Secondary | ICD-10-CM | POA: Diagnosis not present

## 2022-01-22 DIAGNOSIS — N2581 Secondary hyperparathyroidism of renal origin: Secondary | ICD-10-CM | POA: Diagnosis not present

## 2022-01-22 DIAGNOSIS — D509 Iron deficiency anemia, unspecified: Secondary | ICD-10-CM | POA: Diagnosis not present

## 2022-01-22 DIAGNOSIS — Z992 Dependence on renal dialysis: Secondary | ICD-10-CM | POA: Diagnosis not present

## 2022-01-22 DIAGNOSIS — D631 Anemia in chronic kidney disease: Secondary | ICD-10-CM | POA: Diagnosis not present

## 2022-01-22 DIAGNOSIS — N250 Renal osteodystrophy: Secondary | ICD-10-CM | POA: Diagnosis not present

## 2022-01-24 ENCOUNTER — Ambulatory Visit (INDEPENDENT_AMBULATORY_CARE_PROVIDER_SITE_OTHER): Payer: Medicare Other

## 2022-01-24 VITALS — Ht 70.0 in | Wt 194.0 lb

## 2022-01-24 DIAGNOSIS — Z0000 Encounter for general adult medical examination without abnormal findings: Secondary | ICD-10-CM

## 2022-01-24 NOTE — Progress Notes (Signed)
Subjective:   Anthony Bullock is a 62 y.o. male who presents for Medicare Annual/Subsequent preventive examination.  Review of Systems    Virtual Visit via Telephone Note  I connected with  Anthony Bullock on 01/24/22 at  9:30 AM EST by telephone and verified that I am speaking with the correct person using two identifiers.  Location: Patient: Home Provider: Office Persons participating in the virtual visit: patient/Nurse Health Advisor   I discussed the limitations, risks, security and privacy concerns of performing an evaluation and management service by telephone and the availability of in person appointments. The patient expressed understanding and agreed to proceed.  Interactive audio and video telecommunications were attempted between this nurse and patient, however failed, due to patient having technical difficulties OR patient did not have access to video capability.  We continued and completed visit with audio only.  Some vital signs may be absent or patient reported.   Criselda Peaches, LPN  Cardiac Risk Factors include: advanced age (>67mn, >>21women);diabetes mellitus;male gender;hypertension     Objective:    Today's Vitals   01/24/22 1030  Weight: 194 lb (88 kg)  Height: _0  (1.778 m)   Body mass index is 27.84 kg/m.     01/24/2022   10:41 AM 01/22/2021    9:17 AM 07/16/2020    3:14 PM 04/29/2020    1:01 PM 01/20/2020   11:32 AM 11/18/2019   12:53 PM 11/16/2019    2:35 PM  Advanced Directives  Does Patient Have a Medical Advance Directive? _1  No No  Type of AVisual merchandiserof AHendersonLiving will   Does patient want to make changes to medical advance directive?      Yes (Inpatient - patient requests chaplain consult to change a medical advance directive)   Copy of [place] Chart?      No - copy requested   Would patient like information on creating a medical advance directive? No - Patient declined No  - Patient declined No - Patient declined No - Patient declined No - Patient declined  No - Patient declined    Current Medications (verified) Outpatient Encounter Medications as of 01/24/2022  Medication Sig   acetaminophen (TYLENOL) 325 MG tablet Take 1-2 tablets (325-650 mg total) by mouth every 4 (four) hours as needed for mild pain.   albuterol (VENTOLIN HFA) 108 (90 Base) MCG/ACT inhaler Inhale 2 puffs into the lungs every 4 (four) hours as needed for wheezing or shortness of breath.   aspirin EC 81 MG tablet Take 81 mg by mouth daily.    atorvastatin (LIPITOR) 80 MG tablet TAKE 1 TABLET BY MOUTH AT BEDTIME   azelastine (ASTELIN) 0.1 % nasal spray Place 2 sprays into both nostrils 3 (three) times daily as needed for rhinitis. Use in each nostril as directed   benzonatate (TESSALON PERLES) 100 MG capsule 1-2 capsules up to twice daily as needed for cough   CALCITRIOL PO Take by mouth 3 (three) times a week. ONLY PROVIDED AT DIALYSIS   carvedilol (COREG) 12.5 MG tablet Take 1 tablet by mouth twice daily   cinacalcet (SENSIPAR) 30 MG tablet Take 1 tablet (30 mg total) by mouth Every Tuesday,Thursday,and Saturday with dialysis.   Continuous Blood Gluc Sensor (FREESTYLE LIBRE 14 DAY SENSOR) MISC Use as directed   fenofibrate 160 MG tablet Take 1 tablet by mouth once daily   fexofenadine (ALLEGRA) 180 MG  tablet Take 180 mg by mouth daily.   furosemide (LASIX) 40 MG tablet Take 3 tablets (120 mg total) by mouth daily.   gabapentin (NEURONTIN) 300 MG capsule Take 2 capsules by mouth twice daily   glucose blood test strip Check 1 time daily. E11.9 One Touch Ultra Blue Test Strips   HUMALOG KWIKPEN 200 UNIT/ML KwikPen INJECT SUBCUTANEOUSLY TWICE DAILY WITH LUNCH AND SUPPER PER SLIDING SCALE, MAXIMUM OF 28 UNITS DAILY. APPOINTMENT NEEDED FOR REFILLS   insulin glargine (LANTUS SOLOSTAR) 100 UNIT/ML Solostar Pen Inject 14 Units into the skin daily.   Insulin Pen Needle (BD PEN NEEDLE NANO U/F) 32G X 4  MM MISC USE 1 PEN NEEDLE SUBCUTANEOUSLY WITH INSULIN 4 TIMES DAILY   lanthanum (FOSRENOL) 500 MG chewable tablet Chew 500-1,000 mg by mouth See admin instructions. Take 1000 mg with meals three time a day and 500 mg with snacks   midodrine (PROAMATINE) 10 MG tablet Take 10 mg by mouth 3 (three) times a week.   multivitamin (RENA-VIT) TABS tablet Take 1 tablet by mouth once daily   Olopatadine HCl 0.2 % SOLN Place 1 drop into both eyes daily as needed (for allergies).    ondansetron (ZOFRAN ODT) 8 MG disintegrating tablet Take 1 tablet (8 mg total) by mouth every 8 (eight) hours as needed for nausea or vomiting.   No facility-administered encounter medications on file as of 01/24/2022.    Allergies (verified) Epoetin alfa, Ferumoxytol, and Morphine sulfate   History: Past Medical History:  Diagnosis Date   AICD (automatic cardioverter/defibrillator) present    [employer]   Allergic rhinitis    Anemia    Arthritis    Chronic systolic heart failure ([place])    a. ECHO (12/2012) EF 25-30%, HK entireanteroseptal myocardium //  b.  EF 25%, diffuse HK, grade 1 diastolic dysfunction, MAC, mild LAE, normal RVSF, trivial pericardial effusion   COPD (chronic obstructive pulmonary disease) ([place])    Diabetes mellitus type II    Diabetic nephropathy ([place])    Diabetic neuropathy ([place])    ESRD on hemodialysis ([place])    started HD June 2017, goes to [facility] HD unit, Dr Hinda Lenis   History of cardiac catheterization    a.Myoview 1/15:  There is significant left ventricular dysfunction. There may be slight scar at the apex. There is no significant ischemia. LV Ejection Fraction: 27%  //  b. RHC/LHC (1/15) with mean RA 6, PA 47/22 mean 33, mean PCWP 20, PVR 2.5 WU, CI 2.5; 80% dLAD stenosis, 70% diffuse large D.     History of kidney stones    Hyperlipidemia    Hypertension    Kidney stones    NICM (nonischemic cardiomyopathy) ([place])    Primarily nonischemic.  Echo (12/14) with EF 25-30%.  Echo  (3/15) with EF 25%, mild to moderately dilated LV, normal RV size and systolic function.     Osteomyelitis ([place])    left fifth ray   Pneumonia    Urethral stricture    Wears glasses    Past Surgical History:  Procedure Laterality Date   ABDOMINAL AORTOGRAM W/LOWER EXTREMITY N/A 03/30/2016   Procedure: Abdominal Aortogram w/Lower Extremity;  Surgeon: Angelia Mould, MD;  Location: [place] [facility];  Service: Cardiovascular;  Laterality: N/A;   AMPUTATION Right 04/26/2016   Procedure: Right Below Knee Amputation;  Surgeon: Newt Minion, MD;  Location: [place];  Service: Orthopedics;  Laterality: Right;   AMPUTATION Left 08/21/2019   Procedure: LEFT FOOT 5TH  RAY AMPUTATION;  Surgeon: Newt Minion, MD;  Location: [place];  Service: Orthopedics;  Laterality: Left;   AMPUTATION Left 11/13/2019   Procedure: LEFT BELOW KNEE AMPUTATION;  Surgeon: Newt Minion, MD;  Location: [place];  Service: Orthopedics;  Laterality: Left;   AV FISTULA PLACEMENT Right 09/08/2015   Procedure: INSERTION OF 4-10m x 45cm  ARTERIOVENOUS (AV) GORE-TEX GRAFT RIGHT UPPER  ARM;  Surgeon: CAngelia Mould MD;  [place]  Service: Vascular;  Laterality: Right;   AV FISTULA PLACEMENT Left 01/14/2016   Procedure: CREATION OF LEFT UPPER ARM ARTERIOVENOUS FISTULA;  Surgeon: CAngelia Mould MD;  [place]: [place]  Service: Vascular;  Laterality: Left;   BCorn[place]Right 08/22/2014   Procedure: RIGHT UPPER ARM [place]  Surgeon: CAngelia Mould MD;  [place]  Service: Vascular;  Laterality: Right;   BELOW KNEE LEG AMPUTATION Right 04/26/2016   CARDIAC CATHETERIZATION     CARDIAC DEFIBRILLATOR PLACEMENT  06/27/2013   Sub Q       BY DR KCaryl Comes  CATARACT EXTRACTION W/PHACO Right 08/06/2018   Procedure: CATARACT EXTRACTION PHACO AND INTRAOCULAR LENS PLACEMENT ([place];  Surgeon: WBaruch Goldmann MD;  [facility];  Service: Ophthalmology;  Laterality: Right;  CDE:  4.06   CATARACT EXTRACTION W/PHACO Left 08/20/2018   Procedure: CATARACT EXTRACTION PHACO AND INTRAOCULAR LENS PLACEMENT (IOC);  Surgeon: WBaruch Goldmann MD;  Location: [facility];  Service: Ophthalmology;  Laterality: Left;  CDE: 6.76   COLONOSCOPY WITH PROPOFOL N/A 07/22/2015   Procedure: COLONOSCOPY WITH PROPOFOL;  Surgeon: HDoran Stabler MD;  [facility];  Service: Gastroenterology;  Laterality: N/A;   FEMORAL-POPLITEAL BYPASS GRAFT Right 03/31/2016   Procedure: BYPASS GRAFT FEMORAL-POPLITEAL ARTERY USING RIGHT GREATER SAPHENOUS NONREVERSED VEIN;  Surgeon: CAngelia Mould MD;  Location: [place]  Service: Vascular;  Laterality: Right;   HERNIA REPAIR     I & D EXTREMITY Right 03/31/2016   Procedure: IRRIGATION AND DEBRIDEMENT FOOT;  Surgeon: CAngelia Mould MD;  [place]  Service: Vascular;  Laterality: Right;   IMPLANTABLE CARDIOVERTER DEFIBRILLATOR IMPLANT N/A 06/27/2013   Procedure: SUB Q ICD;  Surgeon: SDeboraha Sprang MD;  [facility];  Service: Cardiovascular;  Laterality: N/A;   INTRAOPERATIVE ARTERIOGRAM Right 03/31/2016   Procedure: INTRA OPERATIVE ARTERIOGRAM;  Surgeon: CAngelia Mould MD;  [place]  Service: Vascular;  Laterality: Right;   IR GENERIC HISTORICAL Right 11/30/2015   IR THROMBECTOMY AV FISTULA W/THROMBOLYSIS/PTA INC/SHUNT/IMG RIGHT 11/30/2015 GAletta Edouard MD [facility]   IR GENERIC HISTORICAL  11/30/2015   IR [place] VASC ACCESS RIGHT 11/30/2015 GAletta Edouard MD [facility]   IR GENERIC HISTORICAL Right 12/15/2015   IR THROMBECTOMY AV FISTULA W/THROMBOLYSIS/PTA/STENT INC/SHUNT/IMG RT 12/15/2015 DArne Cleveland MD [facility]   IR GENERIC HISTORICAL  12/15/2015   IR [place] VASC ACCESS RIGHT 12/15/2015 DArne Cleveland MD [facility]   IR GENERIC HISTORICAL  12/28/2015   IR FLUORO GUIDE CV LINE RIGHT 12/28/2015 AMarybelle Killings MD [facility]   IR GENERIC HISTORICAL  12/28/2015   IR [place] VASC ACCESS  RIGHT 12/28/2015 AMarybelle Killings MD [facility]   LEFT A ND RIGHT HEART CATH  01/30/2013   DR BSung Amabile  LEFT AND RIGHT HEART CATHETERIZATION WITH CORONARY ANGIOGRAM N/A 01/30/2013   Procedure: LEFT AND RIGHT HEART CATHETERIZATION WITH CORONARY ANGIOGRAM;  Surgeon: DJolaine Artist MD;  [facility];  Service: Cardiovascular;  Laterality: N/A;   PERIPHERAL VASCULAR CATHETERIZATION Right 01/26/2015   Procedure: A/V Fistulagram;  Surgeon: Angelia Mould, MD;  Location: [place] [facility];  Service: Cardiovascular;  Laterality: Right;   reapea urethral surgery for recurrent obstruction  2011   TOTAL KNEE ARTHROPLASTY Right 2007   VEIN HARVEST Right 03/31/2016   Procedure: RIGHT GREATER SAPHENOUS VEIN HARVEST;  Surgeon: Angelia Mould, MD;  [facility];  Service: Vascular;  Laterality: Right;   Family History  Problem Relation Age of Onset   Bladder Cancer Mother    Alcohol abuse Father    Melanoma Father    Stroke Maternal Grandmother    Heart Problems Maternal Grandmother        unknown   Diabetes Maternal Grandmother    Heart disease Maternal Grandfather    Prostate cancer Maternal Grandfather    Social History   Socioeconomic History   Marital status: Married    Spouse name: Not on file   Number of children: 0   Years of education: Not on file   Highest education level: Not on file  Occupational History   Not on file  Tobacco Use   Smoking status: Former    Packs/day: 2.00    Years: 32.00    Total pack years: 64.00    Types: Cigarettes    Quit date: 05/11/2010    Years since quitting: 11.7   Smokeless tobacco: Never  Vaping Use   Vaping Use: Never used  Substance and Sexual Activity   Alcohol use: No   Drug use: No   Sexual activity: Yes  Other Topics Concern   Not on file  Social History Narrative   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   Social Determinants of Health   Financial Resource Strain: Low Risk  (01/24/2022)   Overall Financial  Resource Strain (CARDIA)    Difficulty of Paying Living Expenses: Not hard at all  Food Insecurity: No Food Insecurity (01/24/2022)   Hunger Vital Sign    Worried About Running Out of Food in the Last Year: Never true    [place] in the Last Year: Never true  Transportation Needs: No Transportation Needs (01/24/2022)   PRAPARE - Hydrologist (Medical): No    Lack of Transportation (Non-Medical): No  Physical Activity: Inactive (01/24/2022)   Exercise Vital Sign    Days of Exercise per Week: 0 days    Minutes of Exercise per Session: 0 min  Stress: No Stress Concern Present (01/24/2022)   [place]    Feeling of Stress : Not at all  Social Connections: [place] (01/24/2022)   Social Connection and Isolation Panel [NHANES]    Frequency of Communication with Friends and Family: More than three times a week    Frequency of Social Gatherings with Friends and Family: More than three times a week    Attends Religious Services: More than 4 times per year    Active Member of [employer] or Organizations: Yes    Attends Music therapist: More than 4 times per year    Marital Status: Married    Tobacco Counseling Counseling given: Not Answered   Clinical Intake:  Pre-visit preparation completed: No  Pain : No/denies pain   Nutrition Risk Assessment:  Has the patient had any N/V/D within the last 2 months?  No  Does the patient have any non-healing wounds?  No  Has  the patient had any unintentional weight loss or weight gain?  No   Diabetes:  Is the patient diabetic?  Yes  If diabetic, was a CBG obtained today?  Yes CBG 105 Taken by patient Did the patient bring in their glucometer from home?  No  How often do you monitor your CBG's? 4 X Daily.   Financial Strains and Diabetes Management:  Are you having any financial strains with the device, your supplies or your  medication? No .  Does the patient want to be seen by Chronic Care Management for management of their diabetes?  No  Would the patient like to be referred to a Nutritionist or for Diabetic Management?  No   Diabetic Exams:  Diabetic Eye Exam: Completed No. Overdue for diabetic eye exam. Pt has been advised about the importance in completing this exam. A referral has been placed today. Message sent to referral coordinator for scheduling purposes. Advised pt to expect a call from office referred to regarding appt.  Diabetic Foot Exam: Completed No. Pt has been advised about the importance in completing this exam. Pt is scheduled for diabetic foot exam on Followed by PCP.    BMI - recorded: 27.84 Nutritional Status: BMI 25 -29 Overweight Nutritional Risks: None Diabetes: Yes CBG done?: Yes CBG resulted in Enter/ Edit results?: Yes (CBG 105 Taken by patient) Did pt. bring in CBG monitor from home?: No  How often do you need to have someone help you when you read instructions, pamphlets, or other written materials from your doctor or pharmacy?: 1 - Never  Diabetic?  Yes  Interpreter Needed?: No  Information entered by :: Rolene Arbour LPN   Activities of Daily Living    01/24/2022   10:39 AM  In your present state of health, do you have any difficulty performing the following activities:  Hearing? 0  Vision? 0  Difficulty concentrating or making decisions? 0  Walking or climbing stairs? 0  Dressing or bathing? 0  Doing errands, shopping? 0  Preparing Food and eating ? N  Using the Toilet? N  In the past six months, have you accidently leaked urine? N  Do you have problems with loss of bowel control? N  Managing your Medications? N  Managing your Finances? N  Housekeeping or managing your Housekeeping? N    Patient Care Team: [name], MD as PCP - General (Family Medicine) Harl Bowie Alphonse Guild, MD as PCP - Cardiology (Cardiology) Deboraha Sprang, MD as PCP -  Electrophysiology (Cardiology) Deboraha Sprang, MD as Consulting Physician (Cardiology) Fran Lowes, MD (Inactive) as Consulting Physician (Nephrology) Harl Bowie Alphonse Guild, MD as Consulting Physician (Cardiology) [place], [facility], Revonda Standard, MD as Consulting Physician (Cardiology)  Indicate any recent Medical Services you may have received from other than [facility] providers in the past year (date may be approximate).     Assessment:   This is a routine wellness examination for Anthony Bullock.  Hearing/Vision screen Hearing Screening - Comments:: Denies hearing difficulties   Vision Screening - Comments:: Wears rx glasses - up to date with routine eye exams with  [facility]  Dietary issues and exercise activities discussed: Current Exercise Habits: The patient does not participate in regular exercise at present, Exercise limited by: orthopedic condition(s)   Goals Addressed               This Visit's Progress     Patient Stated (pt-stated)  Continue to progress with my prothesis so I can be independent.       Depression Screen    01/24/2022   10:38 AM 03/29/2021   10:56 AM 01/22/2021    9:06 AM 01/18/2021    8:00 AM 07/16/2020    3:07 PM 01/20/2020   11:34 AM 01/01/2020   11:57 AM  PHQ 2/9 Scores  PHQ - 2 Score 0 0 0 0 0 0 0  PHQ- 9 Score      0 0    Fall Risk    01/24/2022   10:40 AM 03/29/2021   10:54 AM 01/22/2021    9:09 AM 01/18/2021    8:00 AM 07/16/2020    3:07 PM  Fall Risk   Falls in the past year? 0 _0 0  Number falls in past yr: 0 1 0 1 0  Comment  bruised ribs Patient fell due to prosthess    Injury with Fall? 0 1 0 0 0  Risk for fall due to : No Fall Risks Impaired balance/gait;Impaired mobility   Impaired mobility  Follow up Falls prevention discussed Education provided;Falls prevention discussed   Education provided;Falls prevention discussed    FALL RISK PREVENTION PERTAINING TO THE HOME:  Any stairs in or around  the home? Yes  If so, are there any without handrails? No  Home free of loose throw rugs in walkways, pet beds, electrical cords, etc? Yes  Adequate lighting in your home to reduce risk of falls? Yes   ASSISTIVE DEVICES UTILIZED TO PREVENT FALLS:  Life alert? No  Use of a cane, walker or w/c? Yes  Grab bars in the bathroom? No  Shower chair or bench in shower? Yes  Elevated toilet seat or a handicapped toilet? Yes   TIMED UP AND GO:  Was the test performed? No . Audio Visit   Cognitive Function:        01/24/2022   10:41 AM 01/22/2021    9:15 AM  6CIT Screen  What Year? 0 points 0 points  What month? 0 points 0 points  What time? 0 points 0 points  Count back from 20 0 points 0 points  Months in reverse 0 points 0 points  Repeat phrase 0 points 0 points  Total Score 0 points 0 points    Immunizations Immunization History  Administered Date(s) Administered   Fluad Quad(high Dose 65+) 10/16/2019   Hepatitis B, adult 07/27/2015, 08/28/2015, 09/30/2015, 02/01/2016, 03/21/2016   Influenza Split 11/10/2010, 11/10/2011, 10/03/2012   Influenza Whole 01/13/2010   Influenza, High Dose Seasonal PF 10/10/2020   Influenza,inj,Quad PF,6+ Mos 09/29/2014, 10/10/2017   Influenza-Unspecified 09/10/2013, 09/20/2015, 10/25/2016, 10/31/2017, 10/11/2018, 10/16/2019   Moderna Covid Bivalent Peds Booster(76moThru 576yr 11/07/2020   Moderna Sars-Covid-2 Vaccination 04/18/2019   PPD Test 04/29/2015, 07/07/2015, 07/07/2015, 07/21/2015, 07/21/2015, 02/01/2016, 02/01/2016, 02/09/2017, 02/09/2017, 02/15/2018, 02/15/2018, 06/20/2019, 06/20/2019   Pneumococcal Conjugate-13 09/23/2015   Pneumococcal Polysaccharide-23 09/23/2015   Pneumococcal-Unspecified 09/23/2015   Td 10/03/2008    TDAP status: Due, Education has been provided regarding the importance of this vaccine. Advised may receive this vaccine at local pharmacy or Health Dept. Aware to provide a copy of the vaccination record if obtained  from local pharmacy or Health Dept. Verbalized acceptance and understanding.  Flu Vaccine status: Up to date    Covid-19 vaccine status: Completed vaccines  Qualifies for Shingles Vaccine? Yes   Zostavax completed No   Shingrix Completed?: No.    Education has been provided regarding the importance  of this vaccine. Patient has been advised to call insurance company to determine out of pocket expense if they have not yet received this vaccine. Advised may also receive vaccine at local pharmacy or Health Dept. Verbalized acceptance and understanding.  Screening Tests Health Maintenance  Topic Date Due   DTaP/Tdap/Td (2 - Tdap) 10/04/2018   FOOT EXAM  03/19/2020   OPHTHALMOLOGY EXAM  07/11/2021   COVID-19 Vaccine (3 - 2023-24 season) 02/09/2022 (Originally 09/10/2021)   INFLUENZA VACCINE  04/10/2022 (Originally 08/10/2021)   Zoster Vaccines- Shingrix (1 of 2) 04/25/2022 (Originally 09/20/2011)   Lung Cancer Screening  01/25/2023 (Originally 09/20/2011)   HEMOGLOBIN A1C  02/18/2022   Medicare Annual Wellness (AWV)  01/25/2023   COLONOSCOPY (Pts 45-88yr Insurance coverage will need to be confirmed)  07/21/2025   Hepatitis C Screening  Completed   HIV Screening  Completed   HPV VACCINES  Aged Out    Health Maintenance  Health Maintenance Due  Topic Date Due   DTaP/Tdap/Td (2 - Tdap) 10/04/2018   FOOT EXAM  03/19/2020   OPHTHALMOLOGY EXAM  07/11/2021    Colorectal cancer screening: Type of screening: Colonoscopy. Completed 07/22/15. Repeat every 10 years  Lung Cancer Screening: (Low Dose CT Chest recommended if Age 61-80years, 30 pack-year currently smoking OR have quit w/in 15years.) does qualify.   Lung Cancer Screening Referral: Deferred  Additional Screening:  Hepatitis C Screening: does qualify; Completed 01/25/17  Vision Screening: Recommended annual ophthalmology exams for early detection of glaucoma and other disorders of the eye. Is the patient up to date with their  annual eye exam?  Yes  Who is the provider or what is the name of the office in which the patient attends annual eye exams? [place] pt is not established with a provider, would they like to be referred to a provider to establish care? No .   Dental Screening: Recommended annual dental exams for proper oral hygiene  Community Resource Referral / Chronic Care Management:  CRR required this visit?  No   CCM required this visit?  No      Plan:     I have personally reviewed and noted the following in the patient's chart:   Medical and social history Use of alcohol, tobacco or illicit drugs  Current medications and supplements including opioid prescriptions. Patient is not currently taking opioid prescriptions. Functional ability and status Nutritional status Physical activity Advanced directives List of other physicians Hospitalizations, surgeries, and ER visits in previous 12 months Vitals Screenings to include cognitive, depression, and falls Referrals and appointments  In addition, I have reviewed and discussed with patient certain preventive protocols, quality metrics, and best practice recommendations. A written personalized care plan for preventive services as well as general preventive health recommendations were provided to patient.     BCriselda Peaches LPN   [id number]  Nurse Notes: None

## 2022-01-24 NOTE — Patient Instructions (Addendum)
[name] , Thank you for taking time to come for your Medicare Wellness Visit. I appreciate your ongoing commitment to your health goals. Please review the following plan we discussed and let me know if I can assist you in the future.   These are the goals we discussed:  Goals       Patient Stated (pt-stated)      Continue to progress with my prothesis so I can be independent.        This is a list of the screening recommended for you and due dates:  Health Maintenance  Topic Date Due   DTaP/Tdap/Td vaccine (2 - Tdap) 10/04/2018   Complete foot exam   03/19/2020   Eye exam for diabetics  07/11/2021   COVID-19 Vaccine (3 - 2023-24 season) 02/09/2022*   Flu Shot  04/10/2022*   Zoster (Shingles) Vaccine (1 of 2) 04/25/2022*   Screening for Lung Cancer  01/25/2023*   Hemoglobin A1C  02/18/2022   Medicare Annual Wellness Visit  01/25/2023   Colon Cancer Screening  07/21/2025   Hepatitis C Screening: USPSTF Recommendation to screen - Ages 18-79 yo.  Completed   HIV Screening  Completed   HPV Vaccine  Aged Out  *Topic was postponed. The date shown is not the original due date.    Advanced directives: Advance directive discussed with you today. Even though you declined this today, please call our office should you change your mind, and we can give you the proper paperwork for you to fill out.   Conditions/risks identified: None  Next appointment: Follow up in one year for your annual wellness visit    Preventive Care 40-64 Years, Male Preventive care refers to lifestyle choices and visits with your health care provider that can promote health and wellness. What does preventive care include? A yearly physical exam. This is also called an annual well check. Dental exams once or twice a year. Routine eye exams. Ask your health care provider how often you should have your eyes checked. Personal lifestyle choices, including: Daily care of your teeth and gums. Regular physical  activity. Eating a healthy diet. Avoiding tobacco and drug use. Limiting alcohol use. Practicing safe sex. Taking low-dose aspirin every day starting at age 58. What happens during an annual well check? The services and screenings done by your health care provider during your annual well check will depend on your age, overall health, lifestyle risk factors, and family history of disease. Counseling  Your health care provider may ask you questions about your: Alcohol use. Tobacco use. Drug use. Emotional well-being. Home and relationship well-being. Sexual activity. Eating habits. Work and work Statistician. Screening  You may have the following tests or measurements: Height, weight, and BMI. Blood pressure. Lipid and cholesterol levels. These may be checked every 5 years, or more frequently if you are over 21 years old. Skin check. Lung cancer screening. You may have this screening every year starting at age 50 if you have a 30-pack-year history of smoking and currently smoke or have quit within the past 15 years. Fecal occult blood test (FOBT) of the stool. You may have this test every year starting at age 2. Flexible sigmoidoscopy or colonoscopy. You may have a sigmoidoscopy every 5 years or a colonoscopy every 10 years starting at age 31. Prostate cancer screening. Recommendations will vary depending on your family history and other risks. Hepatitis C blood test. Hepatitis B blood test. Sexually transmitted disease (STD) testing. Diabetes screening. This is done  by checking your blood sugar (glucose) after you have not eaten for a while (fasting). You may have this done every 1-3 years. Discuss your test results, treatment options, and if necessary, the need for more tests with your health care provider. Vaccines  Your health care provider may recommend certain vaccines, such as: Influenza vaccine. This is recommended every year. Tetanus, diphtheria, and acellular pertussis  (Tdap, Td) vaccine. You may need a Td booster every 10 years. Zoster vaccine. You may need this after age 76. Pneumococcal 13-valent conjugate (PCV13) vaccine. You may need this if you have certain conditions and have not been vaccinated. Pneumococcal polysaccharide (PPSV23) vaccine. You may need one or two doses if you smoke cigarettes or if you have certain conditions. Talk to your health care provider about which screenings and vaccines you need and how often you need them. This information is not intended to replace advice given to you by your health care provider. Make sure you discuss any questions you have with your health care provider. Document Released: 01/23/2015 Document Revised: 09/16/2015 Document Reviewed: 10/28/2014 Elsevier Interactive Patient Education  2017 [employer] Prevention in the Home Falls can cause injuries. They can happen to people of all ages. There are many things you can do to make your home safe and to help prevent falls. What can I do on the outside of my home? Regularly fix the edges of walkways and driveways and fix any cracks. Remove anything that might make you trip as you walk through a door, such as a raised step or threshold. Trim any bushes or trees on the path to your home. Use bright outdoor lighting. Clear any walking paths of anything that might make someone trip, such as rocks or tools. Regularly check to see if handrails are loose or broken. Make sure that both sides of any steps have handrails. Any raised decks and porches should have guardrails on the edges. Have any leaves, snow, or ice cleared regularly. Use sand or salt on walking paths during winter. Clean up any spills in your garage right away. This includes oil or grease spills. What can I do in the bathroom? Use night lights. Install grab bars by the toilet and in the tub and shower. Do not use towel bars as grab bars. Use non-skid mats or decals in the tub or shower. If  you need to sit down in the shower, use a plastic, non-slip stool. Keep the floor dry. Clean up any water that spills on the floor as soon as it happens. Remove soap buildup in the tub or shower regularly. Attach bath mats securely with double-sided non-slip rug tape. Do not have throw rugs and other things on the floor that can make you trip. What can I do in the bedroom? Use night lights. Make sure that you have a light by your bed that is easy to reach. Do not use any sheets or blankets that are too big for your bed. They should not hang down onto the floor. Have a firm chair that has side arms. You can use this for support while you get dressed. Do not have throw rugs and other things on the floor that can make you trip. What can I do in the kitchen? Clean up any spills right away. Avoid walking on wet floors. Keep items that you use a lot in easy-to-reach places. If you need to reach something above you, use a strong step stool that has a grab bar. Keep electrical  cords out of the way. Do not use floor polish or wax that makes floors slippery. If you must use wax, use non-skid floor wax. Do not have throw rugs and other things on the floor that can make you trip. What can I do with my stairs? Do not leave any items on the stairs. Make sure that there are handrails on both sides of the stairs and use them. Fix handrails that are broken or loose. Make sure that handrails are as long as the stairways. Check any carpeting to make sure that it is firmly attached to the stairs. Fix any carpet that is loose or worn. Avoid having throw rugs at the top or bottom of the stairs. If you do have throw rugs, attach them to the floor with carpet tape. Make sure that you have a light switch at the top of the stairs and the bottom of the stairs. If you do not have them, ask someone to add them for you. What else can I do to help prevent falls? Wear shoes that: Do not have high heels. Have rubber  bottoms. Are comfortable and fit you well. Are closed at the toe. Do not wear sandals. If you use a stepladder: Make sure that it is fully opened. Do not climb a closed stepladder. Make sure that both sides of the stepladder are locked into place. Ask someone to hold it for you, if possible. Clearly mark and make sure that you can see: Any grab bars or handrails. First and last steps. Where the edge of each step is. Use tools that help you move around (mobility aids) if they are needed. These include: Canes. Walkers. Scooters. Crutches. Turn on the lights when you go into a dark area. Replace any light bulbs as soon as they burn out. Set up your furniture so you have a clear path. Avoid moving your furniture around. If any of your floors are uneven, fix them. If there are any pets around you, be aware of where they are. Review your medicines with your doctor. Some medicines can make you feel dizzy. This can increase your chance of falling. Ask your doctor what other things that you can do to help prevent falls. This information is not intended to replace advice given to you by your health care provider. Make sure you discuss any questions you have with your health care provider. Document Released: 10/23/2008 Document Revised: 06/04/2015 Document Reviewed: 01/31/2014 Elsevier Interactive Patient Education  2017 [employer].

## 2022-01-25 DIAGNOSIS — D631 Anemia in chronic kidney disease: Secondary | ICD-10-CM | POA: Diagnosis not present

## 2022-01-25 DIAGNOSIS — N186 End stage renal disease: Secondary | ICD-10-CM | POA: Diagnosis not present

## 2022-01-25 DIAGNOSIS — N250 Renal osteodystrophy: Secondary | ICD-10-CM | POA: Diagnosis not present

## 2022-01-25 DIAGNOSIS — N2581 Secondary hyperparathyroidism of renal origin: Secondary | ICD-10-CM | POA: Diagnosis not present

## 2022-01-25 DIAGNOSIS — Z992 Dependence on renal dialysis: Secondary | ICD-10-CM | POA: Diagnosis not present

## 2022-01-25 DIAGNOSIS — D509 Iron deficiency anemia, unspecified: Secondary | ICD-10-CM | POA: Diagnosis not present

## 2022-01-27 DIAGNOSIS — N2581 Secondary hyperparathyroidism of renal origin: Secondary | ICD-10-CM | POA: Diagnosis not present

## 2022-01-27 DIAGNOSIS — N250 Renal osteodystrophy: Secondary | ICD-10-CM | POA: Diagnosis not present

## 2022-01-27 DIAGNOSIS — N186 End stage renal disease: Secondary | ICD-10-CM | POA: Diagnosis not present

## 2022-01-27 DIAGNOSIS — Z992 Dependence on renal dialysis: Secondary | ICD-10-CM | POA: Diagnosis not present

## 2022-01-27 DIAGNOSIS — D631 Anemia in chronic kidney disease: Secondary | ICD-10-CM | POA: Diagnosis not present

## 2022-01-27 DIAGNOSIS — D509 Iron deficiency anemia, unspecified: Secondary | ICD-10-CM | POA: Diagnosis not present

## 2022-01-29 DIAGNOSIS — N186 End stage renal disease: Secondary | ICD-10-CM | POA: Diagnosis not present

## 2022-01-29 DIAGNOSIS — N2581 Secondary hyperparathyroidism of renal origin: Secondary | ICD-10-CM | POA: Diagnosis not present

## 2022-01-29 DIAGNOSIS — D631 Anemia in chronic kidney disease: Secondary | ICD-10-CM | POA: Diagnosis not present

## 2022-01-29 DIAGNOSIS — Z992 Dependence on renal dialysis: Secondary | ICD-10-CM | POA: Diagnosis not present

## 2022-01-29 DIAGNOSIS — D509 Iron deficiency anemia, unspecified: Secondary | ICD-10-CM | POA: Diagnosis not present

## 2022-01-29 DIAGNOSIS — N250 Renal osteodystrophy: Secondary | ICD-10-CM | POA: Diagnosis not present

## 2022-02-01 DIAGNOSIS — D631 Anemia in chronic kidney disease: Secondary | ICD-10-CM | POA: Diagnosis not present

## 2022-02-01 DIAGNOSIS — N186 End stage renal disease: Secondary | ICD-10-CM | POA: Diagnosis not present

## 2022-02-01 DIAGNOSIS — N2581 Secondary hyperparathyroidism of renal origin: Secondary | ICD-10-CM | POA: Diagnosis not present

## 2022-02-01 DIAGNOSIS — D509 Iron deficiency anemia, unspecified: Secondary | ICD-10-CM | POA: Diagnosis not present

## 2022-02-01 DIAGNOSIS — Z992 Dependence on renal dialysis: Secondary | ICD-10-CM | POA: Diagnosis not present

## 2022-02-01 DIAGNOSIS — N250 Renal osteodystrophy: Secondary | ICD-10-CM | POA: Diagnosis not present

## 2022-02-03 DIAGNOSIS — N2581 Secondary hyperparathyroidism of renal origin: Secondary | ICD-10-CM | POA: Diagnosis not present

## 2022-02-03 DIAGNOSIS — N186 End stage renal disease: Secondary | ICD-10-CM | POA: Diagnosis not present

## 2022-02-03 DIAGNOSIS — D509 Iron deficiency anemia, unspecified: Secondary | ICD-10-CM | POA: Diagnosis not present

## 2022-02-03 DIAGNOSIS — Z992 Dependence on renal dialysis: Secondary | ICD-10-CM | POA: Diagnosis not present

## 2022-02-03 DIAGNOSIS — D631 Anemia in chronic kidney disease: Secondary | ICD-10-CM | POA: Diagnosis not present

## 2022-02-03 DIAGNOSIS — N250 Renal osteodystrophy: Secondary | ICD-10-CM | POA: Diagnosis not present

## 2022-02-04 ENCOUNTER — Other Ambulatory Visit: Payer: Self-pay | Admitting: Family Medicine

## 2022-02-05 DIAGNOSIS — N2581 Secondary hyperparathyroidism of renal origin: Secondary | ICD-10-CM | POA: Diagnosis not present

## 2022-02-05 DIAGNOSIS — N250 Renal osteodystrophy: Secondary | ICD-10-CM | POA: Diagnosis not present

## 2022-02-05 DIAGNOSIS — N186 End stage renal disease: Secondary | ICD-10-CM | POA: Diagnosis not present

## 2022-02-05 DIAGNOSIS — Z992 Dependence on renal dialysis: Secondary | ICD-10-CM | POA: Diagnosis not present

## 2022-02-05 DIAGNOSIS — D509 Iron deficiency anemia, unspecified: Secondary | ICD-10-CM | POA: Diagnosis not present

## 2022-02-05 DIAGNOSIS — D631 Anemia in chronic kidney disease: Secondary | ICD-10-CM | POA: Diagnosis not present

## 2022-02-06 ENCOUNTER — Other Ambulatory Visit: Payer: Self-pay | Admitting: Family Medicine

## 2022-02-08 DIAGNOSIS — D631 Anemia in chronic kidney disease: Secondary | ICD-10-CM | POA: Diagnosis not present

## 2022-02-08 DIAGNOSIS — D509 Iron deficiency anemia, unspecified: Secondary | ICD-10-CM | POA: Diagnosis not present

## 2022-02-08 DIAGNOSIS — N186 End stage renal disease: Secondary | ICD-10-CM | POA: Diagnosis not present

## 2022-02-08 DIAGNOSIS — N2581 Secondary hyperparathyroidism of renal origin: Secondary | ICD-10-CM | POA: Diagnosis not present

## 2022-02-08 DIAGNOSIS — N250 Renal osteodystrophy: Secondary | ICD-10-CM | POA: Diagnosis not present

## 2022-02-08 DIAGNOSIS — Z992 Dependence on renal dialysis: Secondary | ICD-10-CM | POA: Diagnosis not present

## 2022-02-09 DIAGNOSIS — Z992 Dependence on renal dialysis: Secondary | ICD-10-CM | POA: Diagnosis not present

## 2022-02-09 DIAGNOSIS — N186 End stage renal disease: Secondary | ICD-10-CM | POA: Diagnosis not present

## 2022-02-10 DIAGNOSIS — N186 End stage renal disease: Secondary | ICD-10-CM | POA: Diagnosis not present

## 2022-02-10 DIAGNOSIS — N250 Renal osteodystrophy: Secondary | ICD-10-CM | POA: Diagnosis not present

## 2022-02-10 DIAGNOSIS — E559 Vitamin D deficiency, unspecified: Secondary | ICD-10-CM | POA: Diagnosis not present

## 2022-02-10 DIAGNOSIS — D509 Iron deficiency anemia, unspecified: Secondary | ICD-10-CM | POA: Diagnosis not present

## 2022-02-10 DIAGNOSIS — N2581 Secondary hyperparathyroidism of renal origin: Secondary | ICD-10-CM | POA: Diagnosis not present

## 2022-02-10 DIAGNOSIS — Z992 Dependence on renal dialysis: Secondary | ICD-10-CM | POA: Diagnosis not present

## 2022-02-10 DIAGNOSIS — D631 Anemia in chronic kidney disease: Secondary | ICD-10-CM | POA: Diagnosis not present

## 2022-02-12 ENCOUNTER — Encounter (HOSPITAL_COMMUNITY): Payer: Self-pay | Admitting: *Deleted

## 2022-02-12 ENCOUNTER — Emergency Department (HOSPITAL_COMMUNITY)
Admission: EM | Admit: 2022-02-12 | Discharge: 2022-02-12 | Disposition: A | Discharge status: Home / Self Care | Payer: Medicare Other | Attending: Emergency Medicine | Admitting: Emergency Medicine

## 2022-02-12 DIAGNOSIS — Z7982 Long term (current) use of aspirin: Secondary | ICD-10-CM | POA: Diagnosis not present

## 2022-02-12 DIAGNOSIS — Z794 Long term (current) use of insulin: Secondary | ICD-10-CM | POA: Diagnosis not present

## 2022-02-12 DIAGNOSIS — N186 End stage renal disease: Secondary | ICD-10-CM | POA: Diagnosis not present

## 2022-02-12 DIAGNOSIS — I10 Essential (primary) hypertension: Secondary | ICD-10-CM | POA: Diagnosis not present

## 2022-02-12 DIAGNOSIS — R0902 Hypoxemia: Secondary | ICD-10-CM | POA: Diagnosis not present

## 2022-02-12 DIAGNOSIS — N250 Renal osteodystrophy: Secondary | ICD-10-CM | POA: Diagnosis not present

## 2022-02-12 DIAGNOSIS — R0602 Shortness of breath: Secondary | ICD-10-CM | POA: Diagnosis not present

## 2022-02-12 DIAGNOSIS — D631 Anemia in chronic kidney disease: Secondary | ICD-10-CM | POA: Diagnosis not present

## 2022-02-12 DIAGNOSIS — R739 Hyperglycemia, unspecified: Secondary | ICD-10-CM | POA: Diagnosis not present

## 2022-02-12 DIAGNOSIS — Z992 Dependence on renal dialysis: Secondary | ICD-10-CM | POA: Diagnosis not present

## 2022-02-12 DIAGNOSIS — N2581 Secondary hyperparathyroidism of renal origin: Secondary | ICD-10-CM | POA: Diagnosis not present

## 2022-02-12 DIAGNOSIS — D509 Iron deficiency anemia, unspecified: Secondary | ICD-10-CM | POA: Diagnosis not present

## 2022-02-12 LAB — CBG MONITORING, ED: Glucose-Capillary: 309 mg/dL — ABNORMAL HIGH (ref 70–99)

## 2022-02-12 NOTE — ED Triage Notes (Signed)
Pt arrived from home via [facility] w c./o of SOB. Pt stated that he woke up with the SOB. Denies any other complaints

## 2022-02-12 NOTE — ED Provider Notes (Signed)
[facility] Provider Note   CSN: [id number] Arrival date & time: 02/12/22  3295     History  Chief Complaint  Patient presents with   Shortness of Breath    Anthony Bullock is a 62 y.o. male.  Patient presents to the emergency department for evaluation of shortness of breath.  Patient reports waking up feeling feeling like he could not catch his breath.  No associated chest pain.  No fever, cough or URI symptoms.       Home Medications Prior to Admission medications   Medication Sig Start Date End Date Taking? Authorizing Provider  acetaminophen (TYLENOL) 325 MG tablet Take 1-2 tablets (325-650 mg total) by mouth every 4 (four) hours as needed for mild pain. 11/29/19   Love, Ivan Anchors, PA-C  albuterol (VENTOLIN HFA) 108 (90 Base) MCG/ACT inhaler Inhale 2 puffs into the lungs every 4 (four) hours as needed for wheezing or shortness of breath. 11/15/21   Burchette, Alinda Sierras, MD  aspirin EC 81 MG tablet Take 81 mg by mouth daily.     [provider]  atorvastatin (LIPITOR) 80 MG tablet TAKE 1 TABLET BY MOUTH AT BEDTIME 01/12/22   Burchette, Alinda Sierras, MD  azelastine (ASTELIN) 0.1 % nasal spray Place 2 sprays into both nostrils 3 (three) times daily as needed for rhinitis. Use in each nostril as directed 03/10/14   Burchette, Alinda Sierras, MD  benzonatate (TESSALON PERLES) 100 MG capsule 1-2 capsules up to twice daily as needed for cough 02/25/21   Lucretia Kern, DO  CALCITRIOL PO Take by mouth 3 (three) times a week. ONLY PROVIDED AT DIALYSIS    [provider]  carvedilol (COREG) 12.5 MG tablet Take 1 tablet by mouth twice daily 10/13/21   Burchette, Alinda Sierras, MD  cinacalcet (SENSIPAR) 30 MG tablet Take 1 tablet (30 mg total) by mouth Every Tuesday,Thursday,and Saturday with dialysis. 11/30/19   Love, Ivan Anchors, PA-C  Continuous Blood Gluc Sensor (FREESTYLE LIBRE 14 DAY SENSOR) MISC USE AS DIRECTED 02/04/22   Burchette, Alinda Sierras, MD   fenofibrate 160 MG tablet Take 1 tablet by mouth once daily 01/12/22   Burchette, Alinda Sierras, MD  fexofenadine (ALLEGRA) 180 MG tablet Take 180 mg by mouth daily.    [provider]  furosemide (LASIX) 40 MG tablet Take 3 tablets (120 mg total) by mouth daily. 01/18/21   Burchette, Alinda Sierras, MD  gabapentin (NEURONTIN) 300 MG capsule Take 2 capsules by mouth twice daily 01/12/22   Burchette, Alinda Sierras, MD  glucose blood test strip Check 1 time daily. E11.9 One Touch Ultra Blue Test Strips 03/10/14   Burchette, Alinda Sierras, MD  HUMALOG KWIKPEN 200 UNIT/ML KwikPen INJECT A MAXIMUM OF 28 UNITS SUBCUTANEOUSLY TWICE DAILY WITH LUNCH AND SUPPER PER SLIDING SCALE. APPOINTMENT REQUIRED FOR FUTURE REFILLS 02/07/22   Burchette, Alinda Sierras, MD  insulin glargine (LANTUS SOLOSTAR) 100 UNIT/ML Solostar Pen Inject 14 Units into the skin daily. 09/07/21   Burchette, Alinda Sierras, MD  Insulin Pen Needle (BD PEN NEEDLE NANO U/F) 32G X 4 MM MISC USE 1 PEN NEEDLE SUBCUTANEOUSLY WITH INSULIN 4 TIMES DAILY 12/04/19   Burchette, Alinda Sierras, MD  lanthanum (FOSRENOL) 500 MG chewable tablet Chew 500-1,000 mg by mouth See admin instructions. Take 1000 mg with meals three time a day and 500 mg with snacks    [provider]  midodrine (PROAMATINE) 10 MG tablet Take 10 mg by mouth 3 (three) times  a week. 08/04/20   [provider]  multivitamin (RENA-VIT) TABS tablet Take 1 tablet by mouth once daily 07/06/21   Burchette, Alinda Sierras, MD  Olopatadine HCl 0.2 % SOLN Place 1 drop into both eyes daily as needed (for allergies).     [provider]  ondansetron (ZOFRAN ODT) 8 MG disintegrating tablet Take 1 tablet (8 mg total) by mouth every 8 (eight) hours as needed for nausea or vomiting. 02/17/21   Burchette, Alinda Sierras, MD      Allergies    Epoetin alfa, Ferumoxytol, and Morphine sulfate    Review of Systems   Review of Systems  Physical Exam Updated Vital Signs BP (!) 181/73   Pulse 87   Temp 97.7 F (36.5 C) (Oral)    Resp 15   SpO2 96%  Physical Exam Vitals and nursing note reviewed.  Constitutional:      General: He is not in acute distress.    Appearance: He is well-developed.  HENT:     Head: Normocephalic and atraumatic.     Mouth/Throat:     Mouth: Mucous membranes are moist.  Eyes:     General: Vision grossly intact. Gaze aligned appropriately.     Extraocular Movements: Extraocular movements intact.     Conjunctiva/sclera: Conjunctivae normal.  Cardiovascular:     Rate and Rhythm: Normal rate and regular rhythm.     Pulses: Normal pulses.     Heart sounds: Normal heart sounds, S1 normal and S2 normal. No murmur heard.    No friction rub. No gallop.  Pulmonary:     Effort: Pulmonary effort is normal. No respiratory distress.     Breath sounds: Normal breath sounds.  Abdominal:     Palpations: Abdomen is soft.     Tenderness: There is no abdominal tenderness. There is no guarding or rebound.     Hernia: No hernia is present.  Musculoskeletal:        General: No swelling.     Cervical back: Full passive range of motion without pain, normal range of motion and neck supple. No pain with movement, spinous process tenderness or muscular tenderness. Normal range of motion.     Right lower leg: No edema.     Left lower leg: No edema.  Skin:    General: Skin is warm and dry.     Capillary Refill: Capillary refill takes less than 2 seconds.     Findings: No ecchymosis, erythema, lesion or wound.  Neurological:     Mental Status: He is alert and oriented to person, place, and time.     GCS: GCS eye subscore is 4. GCS verbal subscore is 5. GCS motor subscore is 6.     Cranial Nerves: Cranial nerves 2-12 are intact.     Sensory: Sensation is intact.     Motor: Motor function is intact. No weakness or abnormal muscle tone.     Coordination: Coordination is intact.  Psychiatric:        Mood and Affect: Mood normal.        Speech: Speech normal.        Behavior: Behavior normal.     ED  Results / Procedures / Treatments   Labs (all labs ordered are listed, but only abnormal results are displayed) Labs Reviewed  CBG MONITORING, ED - Abnormal; Notable for the following components:      Result Value   Glucose-Capillary 309 (*)    All other components within normal limits  EKG None  Radiology No results found.  Procedures Procedures    Medications Ordered in ED Medications - No data to display  ED Course/ Medical Decision Making/ A&P                             Medical Decision Making  Patient reports feeling short of breath when he woke up earlier but this has slowly improved.  He now feels like he is back to his normal baseline.  He reports that he is due at dialysis in 30 minutes and would like to be discharged to go to dialysis.  His vital signs are normal.  No hypoxia.  No tachycardia or fever.  Lungs are clear on auscultation.  It is reasonable to discharge him and complete dialysis this morning, return if his symptoms worsen.        Final Clinical Impression(s) / ED Diagnoses Final diagnoses:  Shortness of breath    Rx / DC Orders ED Discharge Orders     None         Aliza Moret, Gwenyth Allegra, MD 02/12/22 [phone]

## 2022-02-15 DIAGNOSIS — N250 Renal osteodystrophy: Secondary | ICD-10-CM | POA: Diagnosis not present

## 2022-02-15 DIAGNOSIS — Z992 Dependence on renal dialysis: Secondary | ICD-10-CM | POA: Diagnosis not present

## 2022-02-15 DIAGNOSIS — N186 End stage renal disease: Secondary | ICD-10-CM | POA: Diagnosis not present

## 2022-02-15 DIAGNOSIS — N2581 Secondary hyperparathyroidism of renal origin: Secondary | ICD-10-CM | POA: Diagnosis not present

## 2022-02-15 DIAGNOSIS — D509 Iron deficiency anemia, unspecified: Secondary | ICD-10-CM | POA: Diagnosis not present

## 2022-02-15 DIAGNOSIS — D631 Anemia in chronic kidney disease: Secondary | ICD-10-CM | POA: Diagnosis not present

## 2022-02-17 DIAGNOSIS — N250 Renal osteodystrophy: Secondary | ICD-10-CM | POA: Diagnosis not present

## 2022-02-17 DIAGNOSIS — D631 Anemia in chronic kidney disease: Secondary | ICD-10-CM | POA: Diagnosis not present

## 2022-02-17 DIAGNOSIS — N2581 Secondary hyperparathyroidism of renal origin: Secondary | ICD-10-CM | POA: Diagnosis not present

## 2022-02-17 DIAGNOSIS — D509 Iron deficiency anemia, unspecified: Secondary | ICD-10-CM | POA: Diagnosis not present

## 2022-02-17 DIAGNOSIS — Z992 Dependence on renal dialysis: Secondary | ICD-10-CM | POA: Diagnosis not present

## 2022-02-17 DIAGNOSIS — N186 End stage renal disease: Secondary | ICD-10-CM | POA: Diagnosis not present

## 2022-02-19 DIAGNOSIS — N186 End stage renal disease: Secondary | ICD-10-CM | POA: Diagnosis not present

## 2022-02-19 DIAGNOSIS — N250 Renal osteodystrophy: Secondary | ICD-10-CM | POA: Diagnosis not present

## 2022-02-19 DIAGNOSIS — D631 Anemia in chronic kidney disease: Secondary | ICD-10-CM | POA: Diagnosis not present

## 2022-02-19 DIAGNOSIS — D509 Iron deficiency anemia, unspecified: Secondary | ICD-10-CM | POA: Diagnosis not present

## 2022-02-19 DIAGNOSIS — N2581 Secondary hyperparathyroidism of renal origin: Secondary | ICD-10-CM | POA: Diagnosis not present

## 2022-02-19 DIAGNOSIS — Z992 Dependence on renal dialysis: Secondary | ICD-10-CM | POA: Diagnosis not present

## 2022-02-22 DIAGNOSIS — D509 Iron deficiency anemia, unspecified: Secondary | ICD-10-CM | POA: Diagnosis not present

## 2022-02-22 DIAGNOSIS — N2581 Secondary hyperparathyroidism of renal origin: Secondary | ICD-10-CM | POA: Diagnosis not present

## 2022-02-22 DIAGNOSIS — Z992 Dependence on renal dialysis: Secondary | ICD-10-CM | POA: Diagnosis not present

## 2022-02-22 DIAGNOSIS — D631 Anemia in chronic kidney disease: Secondary | ICD-10-CM | POA: Diagnosis not present

## 2022-02-22 DIAGNOSIS — N250 Renal osteodystrophy: Secondary | ICD-10-CM | POA: Diagnosis not present

## 2022-02-22 DIAGNOSIS — N186 End stage renal disease: Secondary | ICD-10-CM | POA: Diagnosis not present

## 2022-02-23 ENCOUNTER — Ambulatory Visit (INDEPENDENT_AMBULATORY_CARE_PROVIDER_SITE_OTHER): Payer: Medicare Other | Admitting: Family Medicine

## 2022-02-23 ENCOUNTER — Encounter: Payer: Self-pay | Admitting: Family Medicine

## 2022-02-23 ENCOUNTER — Encounter (HOSPITAL_COMMUNITY): Payer: Self-pay | Admitting: *Deleted

## 2022-02-23 VITALS — BP 130/70 | HR 76 | Temp 98.4°F | Ht 70.0 in | Wt 205.3 lb

## 2022-02-23 DIAGNOSIS — R0600 Dyspnea, unspecified: Secondary | ICD-10-CM

## 2022-02-23 DIAGNOSIS — M65332 Trigger finger, left middle finger: Secondary | ICD-10-CM

## 2022-02-23 DIAGNOSIS — Z89512 Acquired absence of left leg below knee: Secondary | ICD-10-CM

## 2022-02-23 DIAGNOSIS — Z89511 Acquired absence of right leg below knee: Secondary | ICD-10-CM | POA: Diagnosis not present

## 2022-02-23 NOTE — Patient Instructions (Signed)
We will be setting up overnight pulse oximetry test.

## 2022-02-23 NOTE — Progress Notes (Signed)
Established Patient Office Visit  Subjective   Patient ID: Anthony Bullock, male    DOB: 1960/04/01  Age: 62 y.o. MRN: [id number]  Chief Complaint  Patient presents with   Hand Pain    Patient complains of left hand cramping and pain, x3 months    HPI   Anthony Bullock is. seen for the following items  -Left middle finger trigger finger past couple months.  Denies any injury.  He particularly notices this early in the morning.  Has to sometimes manually extend the left middle finger.  He is not interested in injection therapy at this time.  He has type 2 diabetes with longstanding history of poor control.  He is followed closely by nephrology.  They are getting his A1c every 3 months and he had A1c in December 8 0.2%.  He states he has recurrent dyspnea that usually with episodes occurring on Sunday or Monday.  His current dialysis schedule is every Tuesday, Thursday, and Saturday.  He had recent episode which actually occurred on a Friday of some "rattling in his chest.  EMS was called and O2 sats apparently were in the low to mid 90s.  He was taken into the ER and given some oxygen and eventually improved.  He is requesting prescription for oxygen.  However, we have not documented low O2 sats on room air.  He feels like his sats are dropping at times.  Denies any recent cough.  No chest pains.  Complicated past medical history with history of CAD, systolic heart failure, hypertension, peripheral vascular disease, status post bilateral below-knee amputations Review of Systems  Constitutional:  Negative for chills and fever.  Respiratory:  Positive for shortness of breath. Negative for cough, hemoptysis, sputum production and wheezing.   Cardiovascular:  Negative for chest pain.  Genitourinary:  Negative for dysuria.      Objective:     BP 130/70 (BP Location: Left Arm, Patient Position: Sitting, Cuff Size: Normal)   Pulse 76   Temp 98.4 F (36.9 C) (Oral)   Ht 5' 10"$  (1.778 m)   Wt 205  lb 4.8 oz (93.1 kg)   SpO2 100%   BMI 29.46 kg/m    Physical Exam Vitals reviewed.  Constitutional:      Appearance: Normal appearance.  Cardiovascular:     Rate and Rhythm: Normal rate and regular rhythm.  Pulmonary:     Effort: Pulmonary effort is normal.     Breath sounds: Normal breath sounds. No wheezing or rales.  Musculoskeletal:     Comments: He has tender nodule left hand in the palm proximal to the middle finger MCP joint.  Full range of motion all joints  Neurological:     Mental Status: He is alert.      No results found for any visits on 02/23/22.    The ASCVD Risk score (Arnett DK, et al., 2019) failed to calculate for the following reasons:   The valid total cholesterol range is 130 to 320 mg/dL    Assessment & Plan:   #1 trigger finger left middle finger.  We discussed options for treatment including steroid injection or referral to specialist for discussions regarding surgical options.  He declines steroid injection or referral at this time and will observe.  Handout given.  #2 intermittent dyspnea.  He thinks this is more frequently occurring at night and especially when he is a day or 2 before his next dialysis.  He was specifically requesting home oxygen.  We explained  Medicare guidelines requiring documentation of low O2 sats.  Will set up overnight pulse oximetry.  His O2 sats today in office room air were excellent  #3 type 2 diabetes.  Recent A1c reportedly 8.2%.  He is on insulin regimen.  No recent hypoglycemic symptoms.  Carolann Littler, MD

## 2022-02-24 ENCOUNTER — Other Ambulatory Visit: Payer: Self-pay | Admitting: Family Medicine

## 2022-02-24 DIAGNOSIS — Z992 Dependence on renal dialysis: Secondary | ICD-10-CM | POA: Diagnosis not present

## 2022-02-24 DIAGNOSIS — N250 Renal osteodystrophy: Secondary | ICD-10-CM | POA: Diagnosis not present

## 2022-02-24 DIAGNOSIS — N186 End stage renal disease: Secondary | ICD-10-CM | POA: Diagnosis not present

## 2022-02-24 DIAGNOSIS — N2581 Secondary hyperparathyroidism of renal origin: Secondary | ICD-10-CM | POA: Diagnosis not present

## 2022-02-24 DIAGNOSIS — D509 Iron deficiency anemia, unspecified: Secondary | ICD-10-CM | POA: Diagnosis not present

## 2022-02-24 DIAGNOSIS — D631 Anemia in chronic kidney disease: Secondary | ICD-10-CM | POA: Diagnosis not present

## 2022-02-25 DIAGNOSIS — M65332 Trigger finger, left middle finger: Secondary | ICD-10-CM | POA: Diagnosis not present

## 2022-02-25 DIAGNOSIS — I509 Heart failure, unspecified: Secondary | ICD-10-CM | POA: Diagnosis not present

## 2022-02-25 DIAGNOSIS — E1165 Type 2 diabetes mellitus with hyperglycemia: Secondary | ICD-10-CM | POA: Diagnosis not present

## 2022-02-25 DIAGNOSIS — Z7982 Long term (current) use of aspirin: Secondary | ICD-10-CM | POA: Diagnosis not present

## 2022-02-25 DIAGNOSIS — Z992 Dependence on renal dialysis: Secondary | ICD-10-CM | POA: Diagnosis not present

## 2022-02-25 DIAGNOSIS — Z89511 Acquired absence of right leg below knee: Secondary | ICD-10-CM | POA: Diagnosis not present

## 2022-02-25 DIAGNOSIS — Z89512 Acquired absence of left leg below knee: Secondary | ICD-10-CM | POA: Diagnosis not present

## 2022-02-25 DIAGNOSIS — N186 End stage renal disease: Secondary | ICD-10-CM | POA: Diagnosis not present

## 2022-02-25 DIAGNOSIS — E1122 Type 2 diabetes mellitus with diabetic chronic kidney disease: Secondary | ICD-10-CM | POA: Diagnosis not present

## 2022-02-25 DIAGNOSIS — Z794 Long term (current) use of insulin: Secondary | ICD-10-CM | POA: Diagnosis not present

## 2022-02-26 DIAGNOSIS — N250 Renal osteodystrophy: Secondary | ICD-10-CM | POA: Diagnosis not present

## 2022-02-26 DIAGNOSIS — N186 End stage renal disease: Secondary | ICD-10-CM | POA: Diagnosis not present

## 2022-02-26 DIAGNOSIS — Z992 Dependence on renal dialysis: Secondary | ICD-10-CM | POA: Diagnosis not present

## 2022-02-26 DIAGNOSIS — D631 Anemia in chronic kidney disease: Secondary | ICD-10-CM | POA: Diagnosis not present

## 2022-02-26 DIAGNOSIS — N2581 Secondary hyperparathyroidism of renal origin: Secondary | ICD-10-CM | POA: Diagnosis not present

## 2022-02-26 DIAGNOSIS — D509 Iron deficiency anemia, unspecified: Secondary | ICD-10-CM | POA: Diagnosis not present

## 2022-03-01 DIAGNOSIS — N2581 Secondary hyperparathyroidism of renal origin: Secondary | ICD-10-CM | POA: Diagnosis not present

## 2022-03-01 DIAGNOSIS — D631 Anemia in chronic kidney disease: Secondary | ICD-10-CM | POA: Diagnosis not present

## 2022-03-01 DIAGNOSIS — N250 Renal osteodystrophy: Secondary | ICD-10-CM | POA: Diagnosis not present

## 2022-03-01 DIAGNOSIS — N186 End stage renal disease: Secondary | ICD-10-CM | POA: Diagnosis not present

## 2022-03-01 DIAGNOSIS — Z992 Dependence on renal dialysis: Secondary | ICD-10-CM | POA: Diagnosis not present

## 2022-03-01 DIAGNOSIS — D509 Iron deficiency anemia, unspecified: Secondary | ICD-10-CM | POA: Diagnosis not present

## 2022-03-02 DIAGNOSIS — N186 End stage renal disease: Secondary | ICD-10-CM | POA: Diagnosis not present

## 2022-03-02 DIAGNOSIS — Z992 Dependence on renal dialysis: Secondary | ICD-10-CM | POA: Diagnosis not present

## 2022-03-02 DIAGNOSIS — M65332 Trigger finger, left middle finger: Secondary | ICD-10-CM | POA: Diagnosis not present

## 2022-03-02 DIAGNOSIS — Z89512 Acquired absence of left leg below knee: Secondary | ICD-10-CM | POA: Diagnosis not present

## 2022-03-02 DIAGNOSIS — Z89511 Acquired absence of right leg below knee: Secondary | ICD-10-CM | POA: Diagnosis not present

## 2022-03-02 DIAGNOSIS — E1122 Type 2 diabetes mellitus with diabetic chronic kidney disease: Secondary | ICD-10-CM | POA: Diagnosis not present

## 2022-03-03 DIAGNOSIS — D631 Anemia in chronic kidney disease: Secondary | ICD-10-CM | POA: Diagnosis not present

## 2022-03-03 DIAGNOSIS — N2581 Secondary hyperparathyroidism of renal origin: Secondary | ICD-10-CM | POA: Diagnosis not present

## 2022-03-03 DIAGNOSIS — N250 Renal osteodystrophy: Secondary | ICD-10-CM | POA: Diagnosis not present

## 2022-03-03 DIAGNOSIS — N186 End stage renal disease: Secondary | ICD-10-CM | POA: Diagnosis not present

## 2022-03-03 DIAGNOSIS — Z992 Dependence on renal dialysis: Secondary | ICD-10-CM | POA: Diagnosis not present

## 2022-03-03 DIAGNOSIS — D509 Iron deficiency anemia, unspecified: Secondary | ICD-10-CM | POA: Diagnosis not present

## 2022-03-05 DIAGNOSIS — N186 End stage renal disease: Secondary | ICD-10-CM | POA: Diagnosis not present

## 2022-03-05 DIAGNOSIS — D631 Anemia in chronic kidney disease: Secondary | ICD-10-CM | POA: Diagnosis not present

## 2022-03-05 DIAGNOSIS — N2581 Secondary hyperparathyroidism of renal origin: Secondary | ICD-10-CM | POA: Diagnosis not present

## 2022-03-05 DIAGNOSIS — Z992 Dependence on renal dialysis: Secondary | ICD-10-CM | POA: Diagnosis not present

## 2022-03-05 DIAGNOSIS — D509 Iron deficiency anemia, unspecified: Secondary | ICD-10-CM | POA: Diagnosis not present

## 2022-03-05 DIAGNOSIS — N250 Renal osteodystrophy: Secondary | ICD-10-CM | POA: Diagnosis not present

## 2022-03-08 DIAGNOSIS — N186 End stage renal disease: Secondary | ICD-10-CM | POA: Diagnosis not present

## 2022-03-08 DIAGNOSIS — D509 Iron deficiency anemia, unspecified: Secondary | ICD-10-CM | POA: Diagnosis not present

## 2022-03-08 DIAGNOSIS — N250 Renal osteodystrophy: Secondary | ICD-10-CM | POA: Diagnosis not present

## 2022-03-08 DIAGNOSIS — D631 Anemia in chronic kidney disease: Secondary | ICD-10-CM | POA: Diagnosis not present

## 2022-03-08 DIAGNOSIS — N2581 Secondary hyperparathyroidism of renal origin: Secondary | ICD-10-CM | POA: Diagnosis not present

## 2022-03-08 DIAGNOSIS — Z992 Dependence on renal dialysis: Secondary | ICD-10-CM | POA: Diagnosis not present

## 2022-03-10 ENCOUNTER — Other Ambulatory Visit: Payer: Self-pay | Admitting: Family Medicine

## 2022-03-10 DIAGNOSIS — N2581 Secondary hyperparathyroidism of renal origin: Secondary | ICD-10-CM | POA: Diagnosis not present

## 2022-03-10 DIAGNOSIS — D631 Anemia in chronic kidney disease: Secondary | ICD-10-CM | POA: Diagnosis not present

## 2022-03-10 DIAGNOSIS — N250 Renal osteodystrophy: Secondary | ICD-10-CM | POA: Diagnosis not present

## 2022-03-10 DIAGNOSIS — N186 End stage renal disease: Secondary | ICD-10-CM | POA: Diagnosis not present

## 2022-03-10 DIAGNOSIS — D509 Iron deficiency anemia, unspecified: Secondary | ICD-10-CM | POA: Diagnosis not present

## 2022-03-10 DIAGNOSIS — Z992 Dependence on renal dialysis: Secondary | ICD-10-CM | POA: Diagnosis not present

## 2022-03-11 DIAGNOSIS — N186 End stage renal disease: Secondary | ICD-10-CM | POA: Diagnosis not present

## 2022-03-11 DIAGNOSIS — E1122 Type 2 diabetes mellitus with diabetic chronic kidney disease: Secondary | ICD-10-CM | POA: Diagnosis not present

## 2022-03-11 DIAGNOSIS — Z89512 Acquired absence of left leg below knee: Secondary | ICD-10-CM | POA: Diagnosis not present

## 2022-03-11 DIAGNOSIS — M65332 Trigger finger, left middle finger: Secondary | ICD-10-CM | POA: Diagnosis not present

## 2022-03-11 DIAGNOSIS — Z89511 Acquired absence of right leg below knee: Secondary | ICD-10-CM | POA: Diagnosis not present

## 2022-03-11 DIAGNOSIS — Z992 Dependence on renal dialysis: Secondary | ICD-10-CM | POA: Diagnosis not present

## 2022-03-12 DIAGNOSIS — Z992 Dependence on renal dialysis: Secondary | ICD-10-CM | POA: Diagnosis not present

## 2022-03-12 DIAGNOSIS — N2581 Secondary hyperparathyroidism of renal origin: Secondary | ICD-10-CM | POA: Diagnosis not present

## 2022-03-12 DIAGNOSIS — N250 Renal osteodystrophy: Secondary | ICD-10-CM | POA: Diagnosis not present

## 2022-03-12 DIAGNOSIS — E559 Vitamin D deficiency, unspecified: Secondary | ICD-10-CM | POA: Diagnosis not present

## 2022-03-12 DIAGNOSIS — Z23 Encounter for immunization: Secondary | ICD-10-CM | POA: Diagnosis not present

## 2022-03-12 DIAGNOSIS — N186 End stage renal disease: Secondary | ICD-10-CM | POA: Diagnosis not present

## 2022-03-12 DIAGNOSIS — D509 Iron deficiency anemia, unspecified: Secondary | ICD-10-CM | POA: Diagnosis not present

## 2022-03-15 DIAGNOSIS — D509 Iron deficiency anemia, unspecified: Secondary | ICD-10-CM | POA: Diagnosis not present

## 2022-03-15 DIAGNOSIS — Z23 Encounter for immunization: Secondary | ICD-10-CM | POA: Diagnosis not present

## 2022-03-15 DIAGNOSIS — N2581 Secondary hyperparathyroidism of renal origin: Secondary | ICD-10-CM | POA: Diagnosis not present

## 2022-03-15 DIAGNOSIS — N250 Renal osteodystrophy: Secondary | ICD-10-CM | POA: Diagnosis not present

## 2022-03-15 DIAGNOSIS — N186 End stage renal disease: Secondary | ICD-10-CM | POA: Diagnosis not present

## 2022-03-15 DIAGNOSIS — Z992 Dependence on renal dialysis: Secondary | ICD-10-CM | POA: Diagnosis not present

## 2022-03-17 DIAGNOSIS — N186 End stage renal disease: Secondary | ICD-10-CM | POA: Diagnosis not present

## 2022-03-17 DIAGNOSIS — Z992 Dependence on renal dialysis: Secondary | ICD-10-CM | POA: Diagnosis not present

## 2022-03-17 DIAGNOSIS — N250 Renal osteodystrophy: Secondary | ICD-10-CM | POA: Diagnosis not present

## 2022-03-17 DIAGNOSIS — D509 Iron deficiency anemia, unspecified: Secondary | ICD-10-CM | POA: Diagnosis not present

## 2022-03-17 DIAGNOSIS — Z23 Encounter for immunization: Secondary | ICD-10-CM | POA: Diagnosis not present

## 2022-03-17 DIAGNOSIS — N2581 Secondary hyperparathyroidism of renal origin: Secondary | ICD-10-CM | POA: Diagnosis not present

## 2022-03-18 DIAGNOSIS — E1122 Type 2 diabetes mellitus with diabetic chronic kidney disease: Secondary | ICD-10-CM | POA: Diagnosis not present

## 2022-03-18 DIAGNOSIS — Z992 Dependence on renal dialysis: Secondary | ICD-10-CM | POA: Diagnosis not present

## 2022-03-18 DIAGNOSIS — M65332 Trigger finger, left middle finger: Secondary | ICD-10-CM | POA: Diagnosis not present

## 2022-03-18 DIAGNOSIS — Z89511 Acquired absence of right leg below knee: Secondary | ICD-10-CM | POA: Diagnosis not present

## 2022-03-18 DIAGNOSIS — Z89512 Acquired absence of left leg below knee: Secondary | ICD-10-CM | POA: Diagnosis not present

## 2022-03-18 DIAGNOSIS — N186 End stage renal disease: Secondary | ICD-10-CM | POA: Diagnosis not present

## 2022-03-19 DIAGNOSIS — Z992 Dependence on renal dialysis: Secondary | ICD-10-CM | POA: Diagnosis not present

## 2022-03-19 DIAGNOSIS — D509 Iron deficiency anemia, unspecified: Secondary | ICD-10-CM | POA: Diagnosis not present

## 2022-03-19 DIAGNOSIS — Z23 Encounter for immunization: Secondary | ICD-10-CM | POA: Diagnosis not present

## 2022-03-19 DIAGNOSIS — N2581 Secondary hyperparathyroidism of renal origin: Secondary | ICD-10-CM | POA: Diagnosis not present

## 2022-03-19 DIAGNOSIS — N250 Renal osteodystrophy: Secondary | ICD-10-CM | POA: Diagnosis not present

## 2022-03-19 DIAGNOSIS — N186 End stage renal disease: Secondary | ICD-10-CM | POA: Diagnosis not present

## 2022-03-22 DIAGNOSIS — N2581 Secondary hyperparathyroidism of renal origin: Secondary | ICD-10-CM | POA: Diagnosis not present

## 2022-03-22 DIAGNOSIS — Z23 Encounter for immunization: Secondary | ICD-10-CM | POA: Diagnosis not present

## 2022-03-22 DIAGNOSIS — D509 Iron deficiency anemia, unspecified: Secondary | ICD-10-CM | POA: Diagnosis not present

## 2022-03-22 DIAGNOSIS — N186 End stage renal disease: Secondary | ICD-10-CM | POA: Diagnosis not present

## 2022-03-22 DIAGNOSIS — Z992 Dependence on renal dialysis: Secondary | ICD-10-CM | POA: Diagnosis not present

## 2022-03-22 DIAGNOSIS — N250 Renal osteodystrophy: Secondary | ICD-10-CM | POA: Diagnosis not present

## 2022-03-24 DIAGNOSIS — N2581 Secondary hyperparathyroidism of renal origin: Secondary | ICD-10-CM | POA: Diagnosis not present

## 2022-03-24 DIAGNOSIS — N186 End stage renal disease: Secondary | ICD-10-CM | POA: Diagnosis not present

## 2022-03-24 DIAGNOSIS — N250 Renal osteodystrophy: Secondary | ICD-10-CM | POA: Diagnosis not present

## 2022-03-24 DIAGNOSIS — D509 Iron deficiency anemia, unspecified: Secondary | ICD-10-CM | POA: Diagnosis not present

## 2022-03-24 DIAGNOSIS — Z992 Dependence on renal dialysis: Secondary | ICD-10-CM | POA: Diagnosis not present

## 2022-03-24 DIAGNOSIS — Z23 Encounter for immunization: Secondary | ICD-10-CM | POA: Diagnosis not present

## 2022-03-25 DIAGNOSIS — Z89512 Acquired absence of left leg below knee: Secondary | ICD-10-CM | POA: Diagnosis not present

## 2022-03-25 DIAGNOSIS — Z992 Dependence on renal dialysis: Secondary | ICD-10-CM | POA: Diagnosis not present

## 2022-03-25 DIAGNOSIS — N186 End stage renal disease: Secondary | ICD-10-CM | POA: Diagnosis not present

## 2022-03-25 DIAGNOSIS — M65332 Trigger finger, left middle finger: Secondary | ICD-10-CM | POA: Diagnosis not present

## 2022-03-25 DIAGNOSIS — Z89511 Acquired absence of right leg below knee: Secondary | ICD-10-CM | POA: Diagnosis not present

## 2022-03-25 DIAGNOSIS — E1122 Type 2 diabetes mellitus with diabetic chronic kidney disease: Secondary | ICD-10-CM | POA: Diagnosis not present

## 2022-03-26 DIAGNOSIS — Z992 Dependence on renal dialysis: Secondary | ICD-10-CM | POA: Diagnosis not present

## 2022-03-26 DIAGNOSIS — N2581 Secondary hyperparathyroidism of renal origin: Secondary | ICD-10-CM | POA: Diagnosis not present

## 2022-03-26 DIAGNOSIS — N186 End stage renal disease: Secondary | ICD-10-CM | POA: Diagnosis not present

## 2022-03-26 DIAGNOSIS — N250 Renal osteodystrophy: Secondary | ICD-10-CM | POA: Diagnosis not present

## 2022-03-26 DIAGNOSIS — D509 Iron deficiency anemia, unspecified: Secondary | ICD-10-CM | POA: Diagnosis not present

## 2022-03-26 DIAGNOSIS — Z23 Encounter for immunization: Secondary | ICD-10-CM | POA: Diagnosis not present

## 2022-03-27 DIAGNOSIS — I509 Heart failure, unspecified: Secondary | ICD-10-CM | POA: Diagnosis not present

## 2022-03-27 DIAGNOSIS — Z89511 Acquired absence of right leg below knee: Secondary | ICD-10-CM | POA: Diagnosis not present

## 2022-03-27 DIAGNOSIS — E1165 Type 2 diabetes mellitus with hyperglycemia: Secondary | ICD-10-CM | POA: Diagnosis not present

## 2022-03-27 DIAGNOSIS — N186 End stage renal disease: Secondary | ICD-10-CM | POA: Diagnosis not present

## 2022-03-27 DIAGNOSIS — Z794 Long term (current) use of insulin: Secondary | ICD-10-CM | POA: Diagnosis not present

## 2022-03-27 DIAGNOSIS — Z7982 Long term (current) use of aspirin: Secondary | ICD-10-CM | POA: Diagnosis not present

## 2022-03-27 DIAGNOSIS — M65332 Trigger finger, left middle finger: Secondary | ICD-10-CM | POA: Diagnosis not present

## 2022-03-27 DIAGNOSIS — Z89512 Acquired absence of left leg below knee: Secondary | ICD-10-CM | POA: Diagnosis not present

## 2022-03-27 DIAGNOSIS — Z992 Dependence on renal dialysis: Secondary | ICD-10-CM | POA: Diagnosis not present

## 2022-03-27 DIAGNOSIS — E1122 Type 2 diabetes mellitus with diabetic chronic kidney disease: Secondary | ICD-10-CM | POA: Diagnosis not present

## 2022-03-29 DIAGNOSIS — N186 End stage renal disease: Secondary | ICD-10-CM | POA: Diagnosis not present

## 2022-03-29 DIAGNOSIS — N2581 Secondary hyperparathyroidism of renal origin: Secondary | ICD-10-CM | POA: Diagnosis not present

## 2022-03-29 DIAGNOSIS — D509 Iron deficiency anemia, unspecified: Secondary | ICD-10-CM | POA: Diagnosis not present

## 2022-03-29 DIAGNOSIS — Z992 Dependence on renal dialysis: Secondary | ICD-10-CM | POA: Diagnosis not present

## 2022-03-29 DIAGNOSIS — Z23 Encounter for immunization: Secondary | ICD-10-CM | POA: Diagnosis not present

## 2022-03-29 DIAGNOSIS — N250 Renal osteodystrophy: Secondary | ICD-10-CM | POA: Diagnosis not present

## 2022-03-31 DIAGNOSIS — D509 Iron deficiency anemia, unspecified: Secondary | ICD-10-CM | POA: Diagnosis not present

## 2022-03-31 DIAGNOSIS — N186 End stage renal disease: Secondary | ICD-10-CM | POA: Diagnosis not present

## 2022-03-31 DIAGNOSIS — N2581 Secondary hyperparathyroidism of renal origin: Secondary | ICD-10-CM | POA: Diagnosis not present

## 2022-03-31 DIAGNOSIS — Z992 Dependence on renal dialysis: Secondary | ICD-10-CM | POA: Diagnosis not present

## 2022-03-31 DIAGNOSIS — N250 Renal osteodystrophy: Secondary | ICD-10-CM | POA: Diagnosis not present

## 2022-03-31 DIAGNOSIS — Z23 Encounter for immunization: Secondary | ICD-10-CM | POA: Diagnosis not present

## 2022-04-01 DIAGNOSIS — N186 End stage renal disease: Secondary | ICD-10-CM | POA: Diagnosis not present

## 2022-04-01 DIAGNOSIS — E1122 Type 2 diabetes mellitus with diabetic chronic kidney disease: Secondary | ICD-10-CM | POA: Diagnosis not present

## 2022-04-01 DIAGNOSIS — M65332 Trigger finger, left middle finger: Secondary | ICD-10-CM | POA: Diagnosis not present

## 2022-04-01 DIAGNOSIS — Z89511 Acquired absence of right leg below knee: Secondary | ICD-10-CM | POA: Diagnosis not present

## 2022-04-01 DIAGNOSIS — Z89512 Acquired absence of left leg below knee: Secondary | ICD-10-CM | POA: Diagnosis not present

## 2022-04-01 DIAGNOSIS — Z992 Dependence on renal dialysis: Secondary | ICD-10-CM | POA: Diagnosis not present

## 2022-04-02 DIAGNOSIS — N250 Renal osteodystrophy: Secondary | ICD-10-CM | POA: Diagnosis not present

## 2022-04-02 DIAGNOSIS — Z992 Dependence on renal dialysis: Secondary | ICD-10-CM | POA: Diagnosis not present

## 2022-04-02 DIAGNOSIS — Z23 Encounter for immunization: Secondary | ICD-10-CM | POA: Diagnosis not present

## 2022-04-02 DIAGNOSIS — N186 End stage renal disease: Secondary | ICD-10-CM | POA: Diagnosis not present

## 2022-04-02 DIAGNOSIS — N2581 Secondary hyperparathyroidism of renal origin: Secondary | ICD-10-CM | POA: Diagnosis not present

## 2022-04-02 DIAGNOSIS — D509 Iron deficiency anemia, unspecified: Secondary | ICD-10-CM | POA: Diagnosis not present

## 2022-04-05 DIAGNOSIS — Z992 Dependence on renal dialysis: Secondary | ICD-10-CM | POA: Diagnosis not present

## 2022-04-05 DIAGNOSIS — D509 Iron deficiency anemia, unspecified: Secondary | ICD-10-CM | POA: Diagnosis not present

## 2022-04-05 DIAGNOSIS — N250 Renal osteodystrophy: Secondary | ICD-10-CM | POA: Diagnosis not present

## 2022-04-05 DIAGNOSIS — Z23 Encounter for immunization: Secondary | ICD-10-CM | POA: Diagnosis not present

## 2022-04-05 DIAGNOSIS — N186 End stage renal disease: Secondary | ICD-10-CM | POA: Diagnosis not present

## 2022-04-05 DIAGNOSIS — N2581 Secondary hyperparathyroidism of renal origin: Secondary | ICD-10-CM | POA: Diagnosis not present

## 2022-04-07 DIAGNOSIS — N250 Renal osteodystrophy: Secondary | ICD-10-CM | POA: Diagnosis not present

## 2022-04-07 DIAGNOSIS — N2581 Secondary hyperparathyroidism of renal origin: Secondary | ICD-10-CM | POA: Diagnosis not present

## 2022-04-07 DIAGNOSIS — N186 End stage renal disease: Secondary | ICD-10-CM | POA: Diagnosis not present

## 2022-04-07 DIAGNOSIS — Z992 Dependence on renal dialysis: Secondary | ICD-10-CM | POA: Diagnosis not present

## 2022-04-07 DIAGNOSIS — Z23 Encounter for immunization: Secondary | ICD-10-CM | POA: Diagnosis not present

## 2022-04-07 DIAGNOSIS — D509 Iron deficiency anemia, unspecified: Secondary | ICD-10-CM | POA: Diagnosis not present

## 2022-04-08 DIAGNOSIS — E1122 Type 2 diabetes mellitus with diabetic chronic kidney disease: Secondary | ICD-10-CM | POA: Diagnosis not present

## 2022-04-08 DIAGNOSIS — M65332 Trigger finger, left middle finger: Secondary | ICD-10-CM | POA: Diagnosis not present

## 2022-04-08 DIAGNOSIS — Z992 Dependence on renal dialysis: Secondary | ICD-10-CM | POA: Diagnosis not present

## 2022-04-08 DIAGNOSIS — Z89512 Acquired absence of left leg below knee: Secondary | ICD-10-CM | POA: Diagnosis not present

## 2022-04-08 DIAGNOSIS — Z89511 Acquired absence of right leg below knee: Secondary | ICD-10-CM | POA: Diagnosis not present

## 2022-04-08 DIAGNOSIS — N186 End stage renal disease: Secondary | ICD-10-CM | POA: Diagnosis not present

## 2022-04-09 DIAGNOSIS — D509 Iron deficiency anemia, unspecified: Secondary | ICD-10-CM | POA: Diagnosis not present

## 2022-04-09 DIAGNOSIS — Z992 Dependence on renal dialysis: Secondary | ICD-10-CM | POA: Diagnosis not present

## 2022-04-09 DIAGNOSIS — N186 End stage renal disease: Secondary | ICD-10-CM | POA: Diagnosis not present

## 2022-04-09 DIAGNOSIS — N250 Renal osteodystrophy: Secondary | ICD-10-CM | POA: Diagnosis not present

## 2022-04-09 DIAGNOSIS — Z23 Encounter for immunization: Secondary | ICD-10-CM | POA: Diagnosis not present

## 2022-04-09 DIAGNOSIS — N2581 Secondary hyperparathyroidism of renal origin: Secondary | ICD-10-CM | POA: Diagnosis not present

## 2022-04-10 ENCOUNTER — Other Ambulatory Visit: Payer: Self-pay | Admitting: Family Medicine

## 2022-04-10 DIAGNOSIS — N186 End stage renal disease: Secondary | ICD-10-CM | POA: Diagnosis not present

## 2022-04-10 DIAGNOSIS — Z992 Dependence on renal dialysis: Secondary | ICD-10-CM | POA: Diagnosis not present

## 2022-04-11 ENCOUNTER — Other Ambulatory Visit: Payer: Self-pay | Admitting: Family Medicine

## 2022-04-12 DIAGNOSIS — N250 Renal osteodystrophy: Secondary | ICD-10-CM | POA: Diagnosis not present

## 2022-04-12 DIAGNOSIS — Z992 Dependence on renal dialysis: Secondary | ICD-10-CM | POA: Diagnosis not present

## 2022-04-12 DIAGNOSIS — N186 End stage renal disease: Secondary | ICD-10-CM | POA: Diagnosis not present

## 2022-04-12 DIAGNOSIS — E559 Vitamin D deficiency, unspecified: Secondary | ICD-10-CM | POA: Diagnosis not present

## 2022-04-12 DIAGNOSIS — N2581 Secondary hyperparathyroidism of renal origin: Secondary | ICD-10-CM | POA: Diagnosis not present

## 2022-04-12 DIAGNOSIS — D509 Iron deficiency anemia, unspecified: Secondary | ICD-10-CM | POA: Diagnosis not present

## 2022-04-12 DIAGNOSIS — D631 Anemia in chronic kidney disease: Secondary | ICD-10-CM | POA: Diagnosis not present

## 2022-04-14 DIAGNOSIS — N2581 Secondary hyperparathyroidism of renal origin: Secondary | ICD-10-CM | POA: Diagnosis not present

## 2022-04-14 DIAGNOSIS — D509 Iron deficiency anemia, unspecified: Secondary | ICD-10-CM | POA: Diagnosis not present

## 2022-04-14 DIAGNOSIS — N250 Renal osteodystrophy: Secondary | ICD-10-CM | POA: Diagnosis not present

## 2022-04-14 DIAGNOSIS — D631 Anemia in chronic kidney disease: Secondary | ICD-10-CM | POA: Diagnosis not present

## 2022-04-14 DIAGNOSIS — N186 End stage renal disease: Secondary | ICD-10-CM | POA: Diagnosis not present

## 2022-04-14 DIAGNOSIS — Z992 Dependence on renal dialysis: Secondary | ICD-10-CM | POA: Diagnosis not present

## 2022-04-15 DIAGNOSIS — N186 End stage renal disease: Secondary | ICD-10-CM | POA: Diagnosis not present

## 2022-04-15 DIAGNOSIS — Z89512 Acquired absence of left leg below knee: Secondary | ICD-10-CM | POA: Diagnosis not present

## 2022-04-15 DIAGNOSIS — M65332 Trigger finger, left middle finger: Secondary | ICD-10-CM | POA: Diagnosis not present

## 2022-04-15 DIAGNOSIS — E1122 Type 2 diabetes mellitus with diabetic chronic kidney disease: Secondary | ICD-10-CM | POA: Diagnosis not present

## 2022-04-15 DIAGNOSIS — Z89511 Acquired absence of right leg below knee: Secondary | ICD-10-CM | POA: Diagnosis not present

## 2022-04-15 DIAGNOSIS — Z992 Dependence on renal dialysis: Secondary | ICD-10-CM | POA: Diagnosis not present

## 2022-04-16 DIAGNOSIS — N250 Renal osteodystrophy: Secondary | ICD-10-CM | POA: Diagnosis not present

## 2022-04-16 DIAGNOSIS — N2581 Secondary hyperparathyroidism of renal origin: Secondary | ICD-10-CM | POA: Diagnosis not present

## 2022-04-16 DIAGNOSIS — D509 Iron deficiency anemia, unspecified: Secondary | ICD-10-CM | POA: Diagnosis not present

## 2022-04-16 DIAGNOSIS — Z992 Dependence on renal dialysis: Secondary | ICD-10-CM | POA: Diagnosis not present

## 2022-04-16 DIAGNOSIS — N186 End stage renal disease: Secondary | ICD-10-CM | POA: Diagnosis not present

## 2022-04-16 DIAGNOSIS — D631 Anemia in chronic kidney disease: Secondary | ICD-10-CM | POA: Diagnosis not present

## 2022-04-19 DIAGNOSIS — D631 Anemia in chronic kidney disease: Secondary | ICD-10-CM | POA: Diagnosis not present

## 2022-04-19 DIAGNOSIS — Z992 Dependence on renal dialysis: Secondary | ICD-10-CM | POA: Diagnosis not present

## 2022-04-19 DIAGNOSIS — D509 Iron deficiency anemia, unspecified: Secondary | ICD-10-CM | POA: Diagnosis not present

## 2022-04-19 DIAGNOSIS — N186 End stage renal disease: Secondary | ICD-10-CM | POA: Diagnosis not present

## 2022-04-19 DIAGNOSIS — N2581 Secondary hyperparathyroidism of renal origin: Secondary | ICD-10-CM | POA: Diagnosis not present

## 2022-04-19 DIAGNOSIS — N250 Renal osteodystrophy: Secondary | ICD-10-CM | POA: Diagnosis not present

## 2022-04-21 DIAGNOSIS — Z992 Dependence on renal dialysis: Secondary | ICD-10-CM | POA: Diagnosis not present

## 2022-04-21 DIAGNOSIS — N2581 Secondary hyperparathyroidism of renal origin: Secondary | ICD-10-CM | POA: Diagnosis not present

## 2022-04-21 DIAGNOSIS — N186 End stage renal disease: Secondary | ICD-10-CM | POA: Diagnosis not present

## 2022-04-21 DIAGNOSIS — D509 Iron deficiency anemia, unspecified: Secondary | ICD-10-CM | POA: Diagnosis not present

## 2022-04-21 DIAGNOSIS — D631 Anemia in chronic kidney disease: Secondary | ICD-10-CM | POA: Diagnosis not present

## 2022-04-21 DIAGNOSIS — N250 Renal osteodystrophy: Secondary | ICD-10-CM | POA: Diagnosis not present

## 2022-04-22 DIAGNOSIS — N186 End stage renal disease: Secondary | ICD-10-CM | POA: Diagnosis not present

## 2022-04-22 DIAGNOSIS — E1122 Type 2 diabetes mellitus with diabetic chronic kidney disease: Secondary | ICD-10-CM | POA: Diagnosis not present

## 2022-04-22 DIAGNOSIS — Z992 Dependence on renal dialysis: Secondary | ICD-10-CM | POA: Diagnosis not present

## 2022-04-22 DIAGNOSIS — Z89512 Acquired absence of left leg below knee: Secondary | ICD-10-CM | POA: Diagnosis not present

## 2022-04-22 DIAGNOSIS — M65332 Trigger finger, left middle finger: Secondary | ICD-10-CM | POA: Diagnosis not present

## 2022-04-22 DIAGNOSIS — Z89511 Acquired absence of right leg below knee: Secondary | ICD-10-CM | POA: Diagnosis not present

## 2022-04-23 DIAGNOSIS — N2581 Secondary hyperparathyroidism of renal origin: Secondary | ICD-10-CM | POA: Diagnosis not present

## 2022-04-23 DIAGNOSIS — N250 Renal osteodystrophy: Secondary | ICD-10-CM | POA: Diagnosis not present

## 2022-04-23 DIAGNOSIS — D631 Anemia in chronic kidney disease: Secondary | ICD-10-CM | POA: Diagnosis not present

## 2022-04-23 DIAGNOSIS — Z992 Dependence on renal dialysis: Secondary | ICD-10-CM | POA: Diagnosis not present

## 2022-04-23 DIAGNOSIS — D509 Iron deficiency anemia, unspecified: Secondary | ICD-10-CM | POA: Diagnosis not present

## 2022-04-23 DIAGNOSIS — N186 End stage renal disease: Secondary | ICD-10-CM | POA: Diagnosis not present

## 2022-04-26 DIAGNOSIS — D509 Iron deficiency anemia, unspecified: Secondary | ICD-10-CM | POA: Diagnosis not present

## 2022-04-26 DIAGNOSIS — Z992 Dependence on renal dialysis: Secondary | ICD-10-CM | POA: Diagnosis not present

## 2022-04-26 DIAGNOSIS — N2581 Secondary hyperparathyroidism of renal origin: Secondary | ICD-10-CM | POA: Diagnosis not present

## 2022-04-26 DIAGNOSIS — N186 End stage renal disease: Secondary | ICD-10-CM | POA: Diagnosis not present

## 2022-04-26 DIAGNOSIS — D631 Anemia in chronic kidney disease: Secondary | ICD-10-CM | POA: Diagnosis not present

## 2022-04-26 DIAGNOSIS — N250 Renal osteodystrophy: Secondary | ICD-10-CM | POA: Diagnosis not present

## 2022-04-28 DIAGNOSIS — D631 Anemia in chronic kidney disease: Secondary | ICD-10-CM | POA: Diagnosis not present

## 2022-04-28 DIAGNOSIS — N2581 Secondary hyperparathyroidism of renal origin: Secondary | ICD-10-CM | POA: Diagnosis not present

## 2022-04-28 DIAGNOSIS — Z992 Dependence on renal dialysis: Secondary | ICD-10-CM | POA: Diagnosis not present

## 2022-04-28 DIAGNOSIS — N186 End stage renal disease: Secondary | ICD-10-CM | POA: Diagnosis not present

## 2022-04-28 DIAGNOSIS — D509 Iron deficiency anemia, unspecified: Secondary | ICD-10-CM | POA: Diagnosis not present

## 2022-04-28 DIAGNOSIS — N250 Renal osteodystrophy: Secondary | ICD-10-CM | POA: Diagnosis not present

## 2022-04-29 ENCOUNTER — Other Ambulatory Visit: Payer: Self-pay | Admitting: Family Medicine

## 2022-04-30 DIAGNOSIS — N2581 Secondary hyperparathyroidism of renal origin: Secondary | ICD-10-CM | POA: Diagnosis not present

## 2022-04-30 DIAGNOSIS — Z992 Dependence on renal dialysis: Secondary | ICD-10-CM | POA: Diagnosis not present

## 2022-04-30 DIAGNOSIS — N250 Renal osteodystrophy: Secondary | ICD-10-CM | POA: Diagnosis not present

## 2022-04-30 DIAGNOSIS — D631 Anemia in chronic kidney disease: Secondary | ICD-10-CM | POA: Diagnosis not present

## 2022-04-30 DIAGNOSIS — N186 End stage renal disease: Secondary | ICD-10-CM | POA: Diagnosis not present

## 2022-04-30 DIAGNOSIS — D509 Iron deficiency anemia, unspecified: Secondary | ICD-10-CM | POA: Diagnosis not present

## 2022-05-03 DIAGNOSIS — N186 End stage renal disease: Secondary | ICD-10-CM | POA: Diagnosis not present

## 2022-05-03 DIAGNOSIS — D631 Anemia in chronic kidney disease: Secondary | ICD-10-CM | POA: Diagnosis not present

## 2022-05-03 DIAGNOSIS — D509 Iron deficiency anemia, unspecified: Secondary | ICD-10-CM | POA: Diagnosis not present

## 2022-05-03 DIAGNOSIS — N250 Renal osteodystrophy: Secondary | ICD-10-CM | POA: Diagnosis not present

## 2022-05-03 DIAGNOSIS — N2581 Secondary hyperparathyroidism of renal origin: Secondary | ICD-10-CM | POA: Diagnosis not present

## 2022-05-03 DIAGNOSIS — Z992 Dependence on renal dialysis: Secondary | ICD-10-CM | POA: Diagnosis not present

## 2022-05-05 DIAGNOSIS — N250 Renal osteodystrophy: Secondary | ICD-10-CM | POA: Diagnosis not present

## 2022-05-05 DIAGNOSIS — D509 Iron deficiency anemia, unspecified: Secondary | ICD-10-CM | POA: Diagnosis not present

## 2022-05-05 DIAGNOSIS — Z992 Dependence on renal dialysis: Secondary | ICD-10-CM | POA: Diagnosis not present

## 2022-05-05 DIAGNOSIS — N186 End stage renal disease: Secondary | ICD-10-CM | POA: Diagnosis not present

## 2022-05-05 DIAGNOSIS — D631 Anemia in chronic kidney disease: Secondary | ICD-10-CM | POA: Diagnosis not present

## 2022-05-05 DIAGNOSIS — N2581 Secondary hyperparathyroidism of renal origin: Secondary | ICD-10-CM | POA: Diagnosis not present

## 2022-05-07 DIAGNOSIS — N2581 Secondary hyperparathyroidism of renal origin: Secondary | ICD-10-CM | POA: Diagnosis not present

## 2022-05-07 DIAGNOSIS — D631 Anemia in chronic kidney disease: Secondary | ICD-10-CM | POA: Diagnosis not present

## 2022-05-07 DIAGNOSIS — N250 Renal osteodystrophy: Secondary | ICD-10-CM | POA: Diagnosis not present

## 2022-05-07 DIAGNOSIS — N186 End stage renal disease: Secondary | ICD-10-CM | POA: Diagnosis not present

## 2022-05-07 DIAGNOSIS — D509 Iron deficiency anemia, unspecified: Secondary | ICD-10-CM | POA: Diagnosis not present

## 2022-05-07 DIAGNOSIS — Z992 Dependence on renal dialysis: Secondary | ICD-10-CM | POA: Diagnosis not present

## 2022-05-10 ENCOUNTER — Ambulatory Visit: Payer: Self-pay

## 2022-05-10 DIAGNOSIS — N2581 Secondary hyperparathyroidism of renal origin: Secondary | ICD-10-CM | POA: Diagnosis not present

## 2022-05-10 DIAGNOSIS — Z992 Dependence on renal dialysis: Secondary | ICD-10-CM | POA: Diagnosis not present

## 2022-05-10 DIAGNOSIS — D631 Anemia in chronic kidney disease: Secondary | ICD-10-CM | POA: Diagnosis not present

## 2022-05-10 DIAGNOSIS — N250 Renal osteodystrophy: Secondary | ICD-10-CM | POA: Diagnosis not present

## 2022-05-10 DIAGNOSIS — D509 Iron deficiency anemia, unspecified: Secondary | ICD-10-CM | POA: Diagnosis not present

## 2022-05-10 DIAGNOSIS — N186 End stage renal disease: Secondary | ICD-10-CM | POA: Diagnosis not present

## 2022-05-10 NOTE — Patient Outreach (Signed)
  Care Coordination   Initial Visit Note   05/10/2022 Name: Anthony Bullock MRN: [id number] DOB: 12/12/1960  Anthony Bullock is a 62 y.o. year old male who sees Burchette, Elberta Fortis, MD for primary care. I spoke with  Avelino Leeds by phone today.  What matters to the patients health and wellness today?  No concerns, patient reports he is doing well at this time    Goals Addressed             This Visit's Progress    COMPLETED: Care Coordination Activities       Care Coordination Interventions: SDoH screening performed - no acute resource challenges identified at this time Determined the patient does not have concerns with medication costs and/or adherence at this time Discussed the patient uses a Cabin crew which only last 14 days - he is requesting a 90 day supply to have less trips to the pharmacy Collaboration with primary care providers office advising of patients request Education provided on the role of the care coordination team - no follow up desired at this time Encouraged the patient to contact his primary care provider as needed         SDOH assessments and interventions completed:  Yes  SDOH Interventions Today    Flowsheet Row Most Recent Value  SDOH Interventions   Food Insecurity Interventions Intervention Not Indicated  Housing Interventions Intervention Not Indicated  Transportation Interventions Intervention Not Indicated  Utilities Interventions Intervention Not Indicated        Care Coordination Interventions:  Yes, provided   Interventions Today    Flowsheet Row Most Recent Value  Chronic Disease   Chronic disease during today's visit Hypertension (HTN), Chronic Obstructive Pulmonary Disease (COPD), Diabetes  General Interventions   General Interventions Discussed/Reviewed General Interventions Discussed, Communication with  Communication with PCP/Specialists  Education Interventions   Education Provided Provided Education  Provided Verbal  Education On Other  [role of the care coordination team]       Follow up plan: No further intervention required.   Encounter Outcome:  Pt. Visit Completed   Bevelyn Ngo, BSW, CDP Social Worker, Certified Dementia Practitioner [facility] Care Management  Care Coordination [phone]

## 2022-05-10 NOTE — Patient Instructions (Signed)
Visit Information  Thank you for taking time to visit with me today. Please don't hesitate to contact me if I can be of assistance to you.   Following are the goals we discussed today:  - Please contact your primary care provider as needed   If you are experiencing a Mental Health or Behavioral Health Crisis or need someone to talk to, please call 1-800-273-TALK (toll free, 24 hour hotline)  Patient verbalizes understanding of instructions and care plan provided today and agrees to view in MyChart. Active MyChart status and patient understanding of how to access instructions and care plan via MyChart confirmed with patient.     Bevelyn Ngo, BSW, CDP Social Worker, Certified Dementia Practitioner [facility] Care Management  Care Coordination [phone]

## 2022-05-11 ENCOUNTER — Telehealth: Payer: Self-pay

## 2022-05-11 MED ORDER — FREESTYLE LIBRE 14 DAY SENSOR MISC: 3 refills | Status: DC

## 2022-05-11 NOTE — Telephone Encounter (Signed)
-----   Message from [facility] sent at 05/10/2022  2:07 PM EDT ----- Mayetta Castleman afternoon!  FYI I reached out to this patient today to assess for SDoH needs. During our conversation he mentioned he uses the free style Josephine Igo censors which last 14 days per use. He mentioned he would like to have this filled in 90 day increments to cut down on the number of trips to the pharmacy.  I let him know I was unsure if they can be filled for 90 days or what the cost may be but that I would pass that along.  Thank you!  Bevelyn Ngo, BSW, CDP Social Worker, Certified Dementia Practitioner [facility] Care Management  Care Coordination [phone]

## 2022-05-11 NOTE — Telephone Encounter (Signed)
Rx sent for 90 days

## 2022-05-12 DIAGNOSIS — N2581 Secondary hyperparathyroidism of renal origin: Secondary | ICD-10-CM | POA: Diagnosis not present

## 2022-05-12 DIAGNOSIS — D509 Iron deficiency anemia, unspecified: Secondary | ICD-10-CM | POA: Diagnosis not present

## 2022-05-12 DIAGNOSIS — Z992 Dependence on renal dialysis: Secondary | ICD-10-CM | POA: Diagnosis not present

## 2022-05-12 DIAGNOSIS — D631 Anemia in chronic kidney disease: Secondary | ICD-10-CM | POA: Diagnosis not present

## 2022-05-12 DIAGNOSIS — N186 End stage renal disease: Secondary | ICD-10-CM | POA: Diagnosis not present

## 2022-05-12 DIAGNOSIS — N250 Renal osteodystrophy: Secondary | ICD-10-CM | POA: Diagnosis not present

## 2022-05-14 DIAGNOSIS — N186 End stage renal disease: Secondary | ICD-10-CM | POA: Diagnosis not present

## 2022-05-14 DIAGNOSIS — D631 Anemia in chronic kidney disease: Secondary | ICD-10-CM | POA: Diagnosis not present

## 2022-05-14 DIAGNOSIS — Z992 Dependence on renal dialysis: Secondary | ICD-10-CM | POA: Diagnosis not present

## 2022-05-14 DIAGNOSIS — N2581 Secondary hyperparathyroidism of renal origin: Secondary | ICD-10-CM | POA: Diagnosis not present

## 2022-05-14 DIAGNOSIS — N250 Renal osteodystrophy: Secondary | ICD-10-CM | POA: Diagnosis not present

## 2022-05-14 DIAGNOSIS — D509 Iron deficiency anemia, unspecified: Secondary | ICD-10-CM | POA: Diagnosis not present

## 2022-05-17 DIAGNOSIS — N250 Renal osteodystrophy: Secondary | ICD-10-CM | POA: Diagnosis not present

## 2022-05-17 DIAGNOSIS — N2581 Secondary hyperparathyroidism of renal origin: Secondary | ICD-10-CM | POA: Diagnosis not present

## 2022-05-17 DIAGNOSIS — Z992 Dependence on renal dialysis: Secondary | ICD-10-CM | POA: Diagnosis not present

## 2022-05-17 DIAGNOSIS — N186 End stage renal disease: Secondary | ICD-10-CM | POA: Diagnosis not present

## 2022-05-17 DIAGNOSIS — D631 Anemia in chronic kidney disease: Secondary | ICD-10-CM | POA: Diagnosis not present

## 2022-05-17 DIAGNOSIS — D509 Iron deficiency anemia, unspecified: Secondary | ICD-10-CM | POA: Diagnosis not present

## 2022-05-19 ENCOUNTER — Telehealth: Payer: Self-pay

## 2022-05-19 DIAGNOSIS — N250 Renal osteodystrophy: Secondary | ICD-10-CM | POA: Diagnosis not present

## 2022-05-19 DIAGNOSIS — N2581 Secondary hyperparathyroidism of renal origin: Secondary | ICD-10-CM | POA: Diagnosis not present

## 2022-05-19 DIAGNOSIS — N186 End stage renal disease: Secondary | ICD-10-CM | POA: Diagnosis not present

## 2022-05-19 DIAGNOSIS — D631 Anemia in chronic kidney disease: Secondary | ICD-10-CM | POA: Diagnosis not present

## 2022-05-19 DIAGNOSIS — Z992 Dependence on renal dialysis: Secondary | ICD-10-CM | POA: Diagnosis not present

## 2022-05-19 DIAGNOSIS — D509 Iron deficiency anemia, unspecified: Secondary | ICD-10-CM | POA: Diagnosis not present

## 2022-05-19 NOTE — Telephone Encounter (Signed)
PA initiated via Covermymeds; KEY: [id number]. Awaiting determination.

## 2022-05-19 NOTE — Telephone Encounter (Signed)
I spoke with Anthony Bullock with Medicare with [facility] and she stated the patient's Freestyle Anthony Bullock has been denied under the patient's part D medicare plan due to the Freestyle being a medical supply and not deemed necessary. Anthony Bullock informed me that =she did contact the patient in regards to this and stated the medication can be approved through Part A/B through medicare.

## 2022-05-19 NOTE — Telephone Encounter (Signed)
PA denied.   Denied. We denied this request under Medicare Part D because the Social Security Act permits the exclusion of certain drugs, classes of drugs, or items from coverage under Part D. Anthony Bullock is a surgical/medical supply, which is one of the excluded items listed in the Social Security Act. Because Anthony Bullock is excluded from coverage under this Act, as well as listed as an exclusion in your Evidence of Coverage document, we are denying your request. NOTE: This request was denied under your Medicare Part D benefit; however, it may be covered under Medicare Part A or Part B. For more information, talk to your prescriber or call 1-800-MEDICARE

## 2022-05-19 NOTE — Telephone Encounter (Signed)
Patient aware.

## 2022-05-21 DIAGNOSIS — D631 Anemia in chronic kidney disease: Secondary | ICD-10-CM | POA: Diagnosis not present

## 2022-05-21 DIAGNOSIS — N250 Renal osteodystrophy: Secondary | ICD-10-CM | POA: Diagnosis not present

## 2022-05-21 DIAGNOSIS — N2581 Secondary hyperparathyroidism of renal origin: Secondary | ICD-10-CM | POA: Diagnosis not present

## 2022-05-21 DIAGNOSIS — Z992 Dependence on renal dialysis: Secondary | ICD-10-CM | POA: Diagnosis not present

## 2022-05-21 DIAGNOSIS — D509 Iron deficiency anemia, unspecified: Secondary | ICD-10-CM | POA: Diagnosis not present

## 2022-05-21 DIAGNOSIS — N186 End stage renal disease: Secondary | ICD-10-CM | POA: Diagnosis not present

## 2022-05-24 DIAGNOSIS — D631 Anemia in chronic kidney disease: Secondary | ICD-10-CM | POA: Diagnosis not present

## 2022-05-24 DIAGNOSIS — Z992 Dependence on renal dialysis: Secondary | ICD-10-CM | POA: Diagnosis not present

## 2022-05-24 DIAGNOSIS — N2581 Secondary hyperparathyroidism of renal origin: Secondary | ICD-10-CM | POA: Diagnosis not present

## 2022-05-24 DIAGNOSIS — N250 Renal osteodystrophy: Secondary | ICD-10-CM | POA: Diagnosis not present

## 2022-05-24 DIAGNOSIS — N186 End stage renal disease: Secondary | ICD-10-CM | POA: Diagnosis not present

## 2022-05-24 DIAGNOSIS — D509 Iron deficiency anemia, unspecified: Secondary | ICD-10-CM | POA: Diagnosis not present

## 2022-05-26 DIAGNOSIS — N2581 Secondary hyperparathyroidism of renal origin: Secondary | ICD-10-CM | POA: Diagnosis not present

## 2022-05-26 DIAGNOSIS — Z992 Dependence on renal dialysis: Secondary | ICD-10-CM | POA: Diagnosis not present

## 2022-05-26 DIAGNOSIS — N250 Renal osteodystrophy: Secondary | ICD-10-CM | POA: Diagnosis not present

## 2022-05-26 DIAGNOSIS — D631 Anemia in chronic kidney disease: Secondary | ICD-10-CM | POA: Diagnosis not present

## 2022-05-26 DIAGNOSIS — D509 Iron deficiency anemia, unspecified: Secondary | ICD-10-CM | POA: Diagnosis not present

## 2022-05-26 DIAGNOSIS — N186 End stage renal disease: Secondary | ICD-10-CM | POA: Diagnosis not present

## 2022-05-28 DIAGNOSIS — D509 Iron deficiency anemia, unspecified: Secondary | ICD-10-CM | POA: Diagnosis not present

## 2022-05-28 DIAGNOSIS — N186 End stage renal disease: Secondary | ICD-10-CM | POA: Diagnosis not present

## 2022-05-28 DIAGNOSIS — Z992 Dependence on renal dialysis: Secondary | ICD-10-CM | POA: Diagnosis not present

## 2022-05-28 DIAGNOSIS — N2581 Secondary hyperparathyroidism of renal origin: Secondary | ICD-10-CM | POA: Diagnosis not present

## 2022-05-28 DIAGNOSIS — D631 Anemia in chronic kidney disease: Secondary | ICD-10-CM | POA: Diagnosis not present

## 2022-05-28 DIAGNOSIS — N250 Renal osteodystrophy: Secondary | ICD-10-CM | POA: Diagnosis not present

## 2022-05-31 DIAGNOSIS — N250 Renal osteodystrophy: Secondary | ICD-10-CM | POA: Diagnosis not present

## 2022-05-31 DIAGNOSIS — N186 End stage renal disease: Secondary | ICD-10-CM | POA: Diagnosis not present

## 2022-05-31 DIAGNOSIS — D509 Iron deficiency anemia, unspecified: Secondary | ICD-10-CM | POA: Diagnosis not present

## 2022-05-31 DIAGNOSIS — Z992 Dependence on renal dialysis: Secondary | ICD-10-CM | POA: Diagnosis not present

## 2022-05-31 DIAGNOSIS — N2581 Secondary hyperparathyroidism of renal origin: Secondary | ICD-10-CM | POA: Diagnosis not present

## 2022-05-31 DIAGNOSIS — D631 Anemia in chronic kidney disease: Secondary | ICD-10-CM | POA: Diagnosis not present

## 2022-06-02 ENCOUNTER — Encounter: Payer: Self-pay | Admitting: Cardiology

## 2022-06-02 ENCOUNTER — Ambulatory Visit: Payer: Medicare Other | Attending: Cardiology | Admitting: Cardiology

## 2022-06-02 VITALS — BP 142/72 | HR 86 | Ht 71.0 in | Wt 207.0 lb

## 2022-06-02 DIAGNOSIS — I6523 Occlusion and stenosis of bilateral carotid arteries: Secondary | ICD-10-CM | POA: Diagnosis not present

## 2022-06-02 DIAGNOSIS — I10 Essential (primary) hypertension: Secondary | ICD-10-CM | POA: Diagnosis not present

## 2022-06-02 DIAGNOSIS — Z8679 Personal history of other diseases of the circulatory system: Secondary | ICD-10-CM | POA: Insufficient documentation

## 2022-06-02 DIAGNOSIS — D631 Anemia in chronic kidney disease: Secondary | ICD-10-CM | POA: Diagnosis not present

## 2022-06-02 DIAGNOSIS — N2581 Secondary hyperparathyroidism of renal origin: Secondary | ICD-10-CM | POA: Diagnosis not present

## 2022-06-02 DIAGNOSIS — E782 Mixed hyperlipidemia: Secondary | ICD-10-CM | POA: Diagnosis not present

## 2022-06-02 DIAGNOSIS — D509 Iron deficiency anemia, unspecified: Secondary | ICD-10-CM | POA: Diagnosis not present

## 2022-06-02 DIAGNOSIS — N250 Renal osteodystrophy: Secondary | ICD-10-CM | POA: Diagnosis not present

## 2022-06-02 DIAGNOSIS — N186 End stage renal disease: Secondary | ICD-10-CM | POA: Diagnosis not present

## 2022-06-02 DIAGNOSIS — I2510 Atherosclerotic heart disease of native coronary artery without angina pectoris: Secondary | ICD-10-CM | POA: Diagnosis not present

## 2022-06-02 DIAGNOSIS — Z992 Dependence on renal dialysis: Secondary | ICD-10-CM | POA: Diagnosis not present

## 2022-06-02 NOTE — Progress Notes (Signed)
Clinical Summary Mr. Summerton is a 62 y.o.male seen today for follow up of the following medical problems.      1. Chronic systolic HF/NICM - echo in 2015 LVEF 25%. echo 08/2015 LVEF has improved to 50-55%. - has had some of his CHF meds decreased due to low bp's on HD.  - has ICD, since LVEF recovered did not have gen change and now inactive.     08/2016 echo LVEF 55%. , grade I diasotlic function.   08/2019 echo LVEF 50%, grade Idd  - Fluid managed by HD   - no SOB/DOE. Compliant with HD           2. ESRD - takes midodrine 10mg  before HD.Ongoing issues with low bp's. Does not take coreg before HD  - ongoing issues with low bp's, takes midodrine before HD       3. HTN - scaled back on bp meds due to low bp's on HD - currently just on coreg.  - just finishded dialysis, lower bp's today.    4. CAD - cath 2015 with diffuse nonobstructive disease, worst lesion 80% small distal LAD.    - no recent chest pains.      5. Hyperlipidemia - Jan 2019 TC 103 TG 136 HDL 38 LDL 38 04/2019 TG 187 HDL 37 LDL 84  06/2020 TC 101 HDL 37 TG 140 LDL 42   05/2021 TC 114 TG 811 HDL 36 LDL 52     6. DM2 - followed by pcp   7. Carotid stenosis - in 2019 mild bilateral disease 07/2021 mild 1-39% bilateral disease.  Past Medical History:  Diagnosis Date   AICD (automatic cardioverter/defibrillator) present    [employer]   Allergic rhinitis    Anemia    Arthritis    Chronic systolic heart failure (HCC)    a. ECHO (12/2012) EF 25-30%, HK entireanteroseptal myocardium //  b.  EF 25%, diffuse HK, grade 1 diastolic dysfunction, MAC, mild LAE, normal RVSF, trivial pericardial effusion   COPD (chronic obstructive pulmonary disease) (HCC)    Diabetes mellitus type II    Diabetic nephropathy (HCC)    Diabetic neuropathy (HCC)    ESRD on hemodialysis (HCC)    started HD June 2017, goes to [facility] HD unit, Dr Fausto Skillern   History of cardiac catheterization    a.Myoview 1/15:   There is significant left ventricular dysfunction. There may be slight scar at the apex. There is no significant ischemia. LV Ejection Fraction: 27%  //  b. RHC/LHC (1/15) with mean RA 6, PA 47/22 mean 33, mean PCWP 20, PVR 2.5 WU, CI 2.5; 80% dLAD stenosis, 70% diffuse large D.     History of kidney stones    Hyperlipidemia    Hypertension    Kidney stones    NICM (nonischemic cardiomyopathy) (HCC)    Primarily nonischemic.  Echo (12/14) with EF 25-30%.  Echo (3/15) with EF 25%, mild to moderately dilated LV, normal RV size and systolic function.     Osteomyelitis (HCC)    left fifth ray   Pneumonia    Urethral stricture    Wears glasses      Allergies  Allergen Reactions   Epoetin Alfa Other (See Comments)    unknown   Ferumoxytol Other (See Comments)    unknown   Morphine Sulfate Rash and Other (See Comments)    Itches all over, red spots     Current Outpatient Medications  Medication Sig  Dispense Refill   acetaminophen (TYLENOL) 325 MG tablet Take 1-2 tablets (325-650 mg total) by mouth every 4 (four) hours as needed for mild pain.     albuterol (VENTOLIN HFA) 108 (90 Base) MCG/ACT inhaler Inhale 2 puffs into the lungs every 4 (four) hours as needed for wheezing or shortness of breath. 1 each 1   aspirin EC 81 MG tablet Take 81 mg by mouth daily.      atorvastatin (LIPITOR) 80 MG tablet TAKE 1 TABLET BY MOUTH AT BEDTIME . APPOINTMENT REQUIRED FOR FUTURE REFILLS 90 tablet 0   azelastine (ASTELIN) 0.1 % nasal spray Place 2 sprays into both nostrils 3 (three) times daily as needed for rhinitis. Use in each nostril as directed 30 mL 12   benzonatate (TESSALON PERLES) 100 MG capsule 1-2 capsules up to twice daily as needed for cough 30 capsule 0   CALCITRIOL PO Take by mouth 3 (three) times a week. ONLY PROVIDED AT DIALYSIS     carvedilol (COREG) 12.5 MG tablet Take 1 tablet by mouth twice daily 180 tablet 0   cinacalcet (SENSIPAR) 30 MG tablet Take 1 tablet (30 mg total) by mouth  Every Tuesday,Thursday,and Saturday with dialysis. 60 tablet    Continuous Glucose Sensor (FREESTYLE LIBRE 14 DAY SENSOR) MISC USE AS DIRECTED EVERY 14 DAYS 6 each 3   fenofibrate 160 MG tablet TAKE 1 TABLET BY MOUTH ONCE DAILY . APPOINTMENT REQUIRED FOR FUTURE REFILLS 90 tablet 0   fexofenadine (ALLEGRA) 180 MG tablet Take 180 mg by mouth daily.     furosemide (LASIX) 40 MG tablet Take 3 tablets by mouth once daily 270 tablet 0   gabapentin (NEURONTIN) 300 MG capsule TAKE 2 CAPSULES BY MOUTH TWICE DAILY . APPOINTMENT REQUIRED FOR FUTURE REFILLS 360 capsule 0   glucose blood test strip Check 1 time daily. E11.9 One Touch Ultra Blue Test Strips 100 each 3   HUMALOG KWIKPEN 200 UNIT/ML KwikPen INJECT A MAXIMUM OF 28 UNITS SUBCUTANEOUSLY TWICE DAILY WITH LUNCH AND SUPPER PER SLIDING SCALE. APPOINTMENT REQUIRED FOR FUTURE REFILLS 6 mL 0   Insulin Pen Needle (BD PEN NEEDLE NANO U/F) 32G X 4 MM MISC USE 1 PEN NEEDLE SUBCUTANEOUSLY WITH INSULIN 4 TIMES DAILY 400 each 0   lanthanum (FOSRENOL) 500 MG chewable tablet Chew 500-1,000 mg by mouth See admin instructions. Take 1000 mg with meals three time a day and 500 mg with snacks     LANTUS SOLOSTAR 100 UNIT/ML Solostar Pen INJECT 12 UNITS SUBCUTANEOUSLY AT BEDTIME 15 mL 0   midodrine (PROAMATINE) 10 MG tablet Take 10 mg by mouth 3 (three) times a week.     multivitamin (RENA-VIT) TABS tablet Take 1 tablet by mouth once daily 10 tablet 3   Olopatadine HCl 0.2 % SOLN Place 1 drop into both eyes daily as needed (for allergies).      ondansetron (ZOFRAN ODT) 8 MG disintegrating tablet Take 1 tablet (8 mg total) by mouth every 8 (eight) hours as needed for nausea or vomiting. 15 tablet 0   No current facility-administered medications for this visit.     Past Surgical History:  Procedure Laterality Date   ABDOMINAL AORTOGRAM W/LOWER EXTREMITY N/A 03/30/2016   Procedure: Abdominal Aortogram w/Lower Extremity;  Surgeon: Chuck Hint, MD;  Location: [facility]  INVASIVE CV LAB;  Service: Cardiovascular;  Laterality: N/A;   AMPUTATION Right 04/26/2016   Procedure: Right Below Knee Amputation;  Surgeon: Nadara Mustard, MD;  Location: [facility] OR;  Service: Orthopedics;  Laterality: Right;   AMPUTATION Left 08/21/2019   Procedure: LEFT FOOT 5TH RAY AMPUTATION;  Surgeon: Nadara Mustard, MD;  [facility] OR;  Service: Orthopedics;  Laterality: Left;   AMPUTATION Left 11/13/2019   Procedure: LEFT BELOW KNEE AMPUTATION;  Surgeon: Nadara Mustard, MD;  Location: [facility] OR;  Service: Orthopedics;  Laterality: Left;   AV FISTULA PLACEMENT Right 09/08/2015   Procedure: INSERTION OF 4-85mm x 45cm  ARTERIOVENOUS (AV) GORE-TEX GRAFT RIGHT UPPER  ARM;  Surgeon: Chuck Hint, MD;  Location: [facility] OR;  Service: Vascular;  Laterality: Right;   AV FISTULA PLACEMENT Left 01/14/2016   Procedure: CREATION OF LEFT UPPER ARM ARTERIOVENOUS FISTULA;  Surgeon: Chuck Hint, MD;  Location: [facility] OR;  Service: Vascular;  Laterality: Left;   BASCILIC VEIN TRANSPOSITION Right 08/22/2014   Procedure: RIGHT UPPER ARM BASCILIC VEIN TRANSPOSITION;  Surgeon: Chuck Hint, MD;  Location: [facility] OR;  Service: Vascular;  Laterality: Right;   BELOW KNEE LEG AMPUTATION Right 04/26/2016   CARDIAC CATHETERIZATION     CARDIAC DEFIBRILLATOR PLACEMENT  06/27/2013   Sub Q       BY DR Graciela Husbands   CATARACT EXTRACTION W/PHACO Right 08/06/2018   Procedure: CATARACT EXTRACTION PHACO AND INTRAOCULAR LENS PLACEMENT (IOC);  Surgeon: Fabio Pierce, MD;  Location: [facility] ORS;  Service: Ophthalmology;  Laterality: Right;  CDE: 4.06   CATARACT EXTRACTION W/PHACO Left 08/20/2018   Procedure: CATARACT EXTRACTION PHACO AND INTRAOCULAR LENS PLACEMENT (IOC);  Surgeon: Fabio Pierce, MD;  Location: [facility] ORS;  Service: Ophthalmology;  Laterality: Left;  CDE: 6.76   COLONOSCOPY WITH PROPOFOL N/A 07/22/2015   Procedure: COLONOSCOPY WITH PROPOFOL;  Surgeon: Sherrilyn Rist, MD;  Location: [facility] ENDOSCOPY;  Service: Gastroenterology;   Laterality: N/A;   FEMORAL-POPLITEAL BYPASS GRAFT Right 03/31/2016   Procedure: BYPASS GRAFT FEMORAL-POPLITEAL ARTERY USING RIGHT GREATER SAPHENOUS NONREVERSED VEIN;  Surgeon: Chuck Hint, MD;  Location: [facility] OR;  Service: Vascular;  Laterality: Right;   HERNIA REPAIR     I & D EXTREMITY Right 03/31/2016   Procedure: IRRIGATION AND DEBRIDEMENT FOOT;  Surgeon: Chuck Hint, MD;  Location: [facility] OR;  Service: Vascular;  Laterality: Right;   IMPLANTABLE CARDIOVERTER DEFIBRILLATOR IMPLANT N/A 06/27/2013   Procedure: SUB Q ICD;  Surgeon: Duke Salvia, MD;  Location: [facility] CATH LAB;  Service: Cardiovascular;  Laterality: N/A;   INTRAOPERATIVE ARTERIOGRAM Right 03/31/2016   Procedure: INTRA OPERATIVE ARTERIOGRAM;  Surgeon: Chuck Hint, MD;  Location: [facility] OR;  Service: Vascular;  Laterality: Right;   IR GENERIC HISTORICAL Right 11/30/2015   IR THROMBECTOMY AV FISTULA W/THROMBOLYSIS/PTA INC/SHUNT/IMG RIGHT 11/30/2015 Irish Lack, MD [facility]   IR GENERIC HISTORICAL  11/30/2015   IR US GUIDE VASC ACCESS RIGHT 11/30/2015 Irish Lack, MD [facility]   IR GENERIC HISTORICAL Right 12/15/2015   IR THROMBECTOMY AV FISTULA W/THROMBOLYSIS/PTA/STENT INC/SHUNT/IMG RT 12/15/2015 Oley Balm, MD [facility]   IR GENERIC HISTORICAL  12/15/2015   IR US GUIDE VASC ACCESS RIGHT 12/15/2015 Oley Balm, MD [facility]   IR GENERIC HISTORICAL  12/28/2015   IR FLUORO GUIDE CV LINE RIGHT 12/28/2015 Jolaine Click, MD [facility]   IR GENERIC HISTORICAL  12/28/2015   IR US GUIDE VASC ACCESS RIGHT 12/28/2015 Jolaine Click, MD [facility]   LEFT A ND RIGHT HEART CATH  01/30/2013   DR Jones Broom   LEFT AND RIGHT HEART CATHETERIZATION WITH CORONARY ANGIOGRAM N/A 01/30/2013   Procedure: LEFT AND RIGHT HEART CATHETERIZATION WITH CORONARY ANGIOGRAM;  Surgeon:  Dolores Patty, MD;  Location: [facility] CATH LAB;  Service: Cardiovascular;  Laterality: N/A;   PERIPHERAL VASCULAR CATHETERIZATION Right  01/26/2015   Procedure: A/V Fistulagram;  Surgeon: Chuck Hint, MD;  Location: [facility] INVASIVE CV LAB;  Service: Cardiovascular;  Laterality: Right;   reapea urethral surgery for recurrent obstruction  2011   TOTAL KNEE ARTHROPLASTY Right 2007   VEIN HARVEST Right 03/31/2016   Procedure: RIGHT GREATER SAPHENOUS VEIN HARVEST;  Surgeon: Chuck Hint, MD;  Location: [facility] OR;  Service: Vascular;  Laterality: Right;     Allergies  Allergen Reactions   Epoetin Alfa Other (See Comments)    unknown   Ferumoxytol Other (See Comments)    unknown   Morphine Sulfate Rash and Other (See Comments)    Itches all over, red spots      Family History  Problem Relation Age of Onset   Bladder Cancer Mother    Alcohol abuse Father    Melanoma Father    Stroke Maternal Grandmother    Heart Problems Maternal Grandmother        unknown   Diabetes Maternal Grandmother    Heart disease Maternal Grandfather    Prostate cancer Maternal Grandfather      Social History Mr. Zahnow reports that he quit smoking about 12 years ago. His smoking use included cigarettes. He has a 64.00 pack-year smoking history. He has never used smokeless tobacco. Mr. Vandine reports no history of alcohol use.   Review of Systems CONSTITUTIONAL: No weight loss, fever, chills, weakness or fatigue.  HEENT: Eyes: No visual loss, blurred vision, double vision or yellow sclerae.No hearing loss, sneezing, congestion, runny nose or sore throat.  SKIN: No rash or itching.  CARDIOVASCULAR: per hpi RESPIRATORY: No shortness of breath, cough or sputum.  GASTROINTESTINAL: No anorexia, nausea, vomiting or diarrhea. No abdominal pain or blood.  GENITOURINARY: No burning on urination, no polyuria NEUROLOGICAL: No headache, dizziness, syncope, paralysis, ataxia, numbness or tingling in the extremities. No change in bowel or bladder control.  MUSCULOSKELETAL: No muscle, back pain, joint pain or stiffness.  LYMPHATICS: No  enlarged nodes. No history of splenectomy.  PSYCHIATRIC: No history of depression or anxiety.  ENDOCRINOLOGIC: No reports of sweating, cold or heat intolerance. No polyuria or polydipsia.  [name]   Physical Examination Today's Vitals   06/02/22 1055  BP: (!) 142/72  Pulse: 86  SpO2: 95%  Weight: 207 lb (93.9 kg)  Height: 5\' 11"  (1.803 m)   Body mass index is 28.87 kg/m.  Gen: resting comfortably, no acute distress HEENT: no scleral icterus, pupils equal round and reactive, no palptable cervical adenopathy,  CV Resp: Clear to auscultation bilaterally GI: abdomen is soft, non-tender, non-distended, normal bowel sounds, no hepatosplenomegaly MSK: bilateral lower extremity prostheses Skin: warm, no rash Neuro:  no focal deficits Psych: appropriate affect   Diagnostic Studies  08/2015 echo Study Conclusions   - Left ventricle: The cavity size was normal. Systolic function was   normal. The estimated ejection fraction was in the range of 50%   to 55%. Wall motion was normal; there were no regional wall   motion abnormalities. Doppler parameters are consistent with   abnormal left ventricular relaxation (grade 1 diastolic   dysfunction). - Left atrium: The atrium was mildly dilated. - Atrial septum: No defect or patent foramen ovale was identified.   Jan 2015 cath Findings:   RA = 6 RV = 48/5/7 PA =  47/22 (33) PCW = 20 Fick cardiac output/index =  5.2/2.5 PVR = 2.5 WU SVR = 1192 FA sat = 98% PA sat = 65%, 68%   Ao Pressure: 116/64 (84) LV Pressure: 122/13/18 There was no signficant gradient across the aortic valve on pullback.   Left main: Mild ostial tapering otherwise ok   LAD: Narrow vessel (due to diffuse diabetic vasculopathy). 30% ostial. 40-50% tubular lesion  in the mid section. 80% lesion distally. Large diagonal with moderate diffuse disease and 70% lesion in midsection   LCX: Non dominant vessel with diffuse diabetic vasculopathy. Large OM-1. Small OM-2  and OM-3. 2 PLs. 30% mid AV groove CX lesion. 40% lesion in proximal OM-1   RCA: Large dominant vessel with just mild plaque   Assessment: 1. CAD with diffuse diabetic vasculopathy. Only high grade lesion is in small distal LAD 2. Well-compensated hemodynamics     Assessment and Plan   1. History of cardiomyopathy.  - LVEF has now normalized - medical therapy has been limited due to low bp's during HD, as well as dizziness - fluid managed by HD - he is euvolemic today without symptoms, conitnue current meds   2. HTN - issues with low bp's during HD, has to take midodrine prior to sessions - accepting higher bp's for him, no med changes   3. CAD - nonobstructive disease by prior cath.  - denies symptoms, continue current meds   4. Carotid stenosis - mild bilateral disease by [name] - likely repeat US next year   5. Hyperlipidemia -he is at goal, continue current meds     Antoine Poche, M.D.

## 2022-06-02 NOTE — Patient Instructions (Signed)
Medication Instructions:  Continue all current medications.   Labwork: none  Testing/Procedures: none  Follow-Up: 6 months   Any Other Special Instructions Will Be Listed Below (If Applicable).   If you need a refill on your cardiac medications before your next appointment, please call your pharmacy.  

## 2022-06-03 ENCOUNTER — Encounter: Payer: Self-pay | Admitting: *Deleted

## 2022-06-04 DIAGNOSIS — N250 Renal osteodystrophy: Secondary | ICD-10-CM | POA: Diagnosis not present

## 2022-06-04 DIAGNOSIS — D631 Anemia in chronic kidney disease: Secondary | ICD-10-CM | POA: Diagnosis not present

## 2022-06-04 DIAGNOSIS — N186 End stage renal disease: Secondary | ICD-10-CM | POA: Diagnosis not present

## 2022-06-04 DIAGNOSIS — N2581 Secondary hyperparathyroidism of renal origin: Secondary | ICD-10-CM | POA: Diagnosis not present

## 2022-06-04 DIAGNOSIS — D509 Iron deficiency anemia, unspecified: Secondary | ICD-10-CM | POA: Diagnosis not present

## 2022-06-04 DIAGNOSIS — Z992 Dependence on renal dialysis: Secondary | ICD-10-CM | POA: Diagnosis not present

## 2022-06-07 ENCOUNTER — Telehealth: Payer: Self-pay | Admitting: Family Medicine

## 2022-06-07 ENCOUNTER — Telehealth: Payer: Self-pay | Admitting: Cardiology

## 2022-06-07 DIAGNOSIS — N2581 Secondary hyperparathyroidism of renal origin: Secondary | ICD-10-CM | POA: Diagnosis not present

## 2022-06-07 DIAGNOSIS — Z992 Dependence on renal dialysis: Secondary | ICD-10-CM | POA: Diagnosis not present

## 2022-06-07 DIAGNOSIS — D631 Anemia in chronic kidney disease: Secondary | ICD-10-CM | POA: Diagnosis not present

## 2022-06-07 DIAGNOSIS — N186 End stage renal disease: Secondary | ICD-10-CM | POA: Diagnosis not present

## 2022-06-07 DIAGNOSIS — D509 Iron deficiency anemia, unspecified: Secondary | ICD-10-CM | POA: Diagnosis not present

## 2022-06-07 DIAGNOSIS — N250 Renal osteodystrophy: Secondary | ICD-10-CM | POA: Diagnosis not present

## 2022-06-07 NOTE — Telephone Encounter (Signed)
Pls fax OV notes from 02/23/2022 to [phone]

## 2022-06-07 NOTE — Telephone Encounter (Signed)
Recent OV note faxed to [facility] per their request.

## 2022-06-07 NOTE — Telephone Encounter (Signed)
[facility] is calling to get recent office notes faxed over.   Fax # [phone]

## 2022-06-08 NOTE — Telephone Encounter (Signed)
Unable to fax notes due to not having a signed release from patient. Patient is aware.

## 2022-06-09 ENCOUNTER — Other Ambulatory Visit: Payer: Self-pay | Admitting: Family Medicine

## 2022-06-09 DIAGNOSIS — Z992 Dependence on renal dialysis: Secondary | ICD-10-CM | POA: Diagnosis not present

## 2022-06-09 DIAGNOSIS — N250 Renal osteodystrophy: Secondary | ICD-10-CM | POA: Diagnosis not present

## 2022-06-09 DIAGNOSIS — N186 End stage renal disease: Secondary | ICD-10-CM | POA: Diagnosis not present

## 2022-06-09 DIAGNOSIS — N2581 Secondary hyperparathyroidism of renal origin: Secondary | ICD-10-CM | POA: Diagnosis not present

## 2022-06-09 DIAGNOSIS — D509 Iron deficiency anemia, unspecified: Secondary | ICD-10-CM | POA: Diagnosis not present

## 2022-06-09 DIAGNOSIS — D631 Anemia in chronic kidney disease: Secondary | ICD-10-CM | POA: Diagnosis not present

## 2022-06-10 DIAGNOSIS — Z992 Dependence on renal dialysis: Secondary | ICD-10-CM | POA: Diagnosis not present

## 2022-06-10 DIAGNOSIS — N186 End stage renal disease: Secondary | ICD-10-CM | POA: Diagnosis not present

## 2022-06-11 DIAGNOSIS — Z992 Dependence on renal dialysis: Secondary | ICD-10-CM | POA: Diagnosis not present

## 2022-06-11 DIAGNOSIS — D509 Iron deficiency anemia, unspecified: Secondary | ICD-10-CM | POA: Diagnosis not present

## 2022-06-11 DIAGNOSIS — N2581 Secondary hyperparathyroidism of renal origin: Secondary | ICD-10-CM | POA: Diagnosis not present

## 2022-06-11 DIAGNOSIS — N186 End stage renal disease: Secondary | ICD-10-CM | POA: Diagnosis not present

## 2022-06-11 DIAGNOSIS — D631 Anemia in chronic kidney disease: Secondary | ICD-10-CM | POA: Diagnosis not present

## 2022-06-11 DIAGNOSIS — N250 Renal osteodystrophy: Secondary | ICD-10-CM | POA: Diagnosis not present

## 2022-06-13 ENCOUNTER — Other Ambulatory Visit: Payer: Self-pay | Admitting: Family Medicine

## 2022-06-14 DIAGNOSIS — D631 Anemia in chronic kidney disease: Secondary | ICD-10-CM | POA: Diagnosis not present

## 2022-06-14 DIAGNOSIS — N2581 Secondary hyperparathyroidism of renal origin: Secondary | ICD-10-CM | POA: Diagnosis not present

## 2022-06-14 DIAGNOSIS — N186 End stage renal disease: Secondary | ICD-10-CM | POA: Diagnosis not present

## 2022-06-14 DIAGNOSIS — Z992 Dependence on renal dialysis: Secondary | ICD-10-CM | POA: Diagnosis not present

## 2022-06-14 DIAGNOSIS — D509 Iron deficiency anemia, unspecified: Secondary | ICD-10-CM | POA: Diagnosis not present

## 2022-06-16 DIAGNOSIS — D631 Anemia in chronic kidney disease: Secondary | ICD-10-CM | POA: Diagnosis not present

## 2022-06-16 DIAGNOSIS — N186 End stage renal disease: Secondary | ICD-10-CM | POA: Diagnosis not present

## 2022-06-16 DIAGNOSIS — Z992 Dependence on renal dialysis: Secondary | ICD-10-CM | POA: Diagnosis not present

## 2022-06-16 DIAGNOSIS — N2581 Secondary hyperparathyroidism of renal origin: Secondary | ICD-10-CM | POA: Diagnosis not present

## 2022-06-16 DIAGNOSIS — D509 Iron deficiency anemia, unspecified: Secondary | ICD-10-CM | POA: Diagnosis not present

## 2022-06-18 DIAGNOSIS — N2581 Secondary hyperparathyroidism of renal origin: Secondary | ICD-10-CM | POA: Diagnosis not present

## 2022-06-18 DIAGNOSIS — N186 End stage renal disease: Secondary | ICD-10-CM | POA: Diagnosis not present

## 2022-06-18 DIAGNOSIS — D509 Iron deficiency anemia, unspecified: Secondary | ICD-10-CM | POA: Diagnosis not present

## 2022-06-18 DIAGNOSIS — D631 Anemia in chronic kidney disease: Secondary | ICD-10-CM | POA: Diagnosis not present

## 2022-06-18 DIAGNOSIS — Z992 Dependence on renal dialysis: Secondary | ICD-10-CM | POA: Diagnosis not present

## 2022-06-21 DIAGNOSIS — D509 Iron deficiency anemia, unspecified: Secondary | ICD-10-CM | POA: Diagnosis not present

## 2022-06-21 DIAGNOSIS — D631 Anemia in chronic kidney disease: Secondary | ICD-10-CM | POA: Diagnosis not present

## 2022-06-21 DIAGNOSIS — N186 End stage renal disease: Secondary | ICD-10-CM | POA: Diagnosis not present

## 2022-06-21 DIAGNOSIS — Z992 Dependence on renal dialysis: Secondary | ICD-10-CM | POA: Diagnosis not present

## 2022-06-21 DIAGNOSIS — N2581 Secondary hyperparathyroidism of renal origin: Secondary | ICD-10-CM | POA: Diagnosis not present

## 2022-06-23 ENCOUNTER — Encounter (HOSPITAL_COMMUNITY): Payer: Self-pay | Admitting: *Deleted

## 2022-06-23 DIAGNOSIS — N186 End stage renal disease: Secondary | ICD-10-CM | POA: Diagnosis not present

## 2022-06-23 DIAGNOSIS — D631 Anemia in chronic kidney disease: Secondary | ICD-10-CM | POA: Diagnosis not present

## 2022-06-23 DIAGNOSIS — N2581 Secondary hyperparathyroidism of renal origin: Secondary | ICD-10-CM | POA: Diagnosis not present

## 2022-06-23 DIAGNOSIS — D509 Iron deficiency anemia, unspecified: Secondary | ICD-10-CM | POA: Diagnosis not present

## 2022-06-23 DIAGNOSIS — Z992 Dependence on renal dialysis: Secondary | ICD-10-CM | POA: Diagnosis not present

## 2022-06-24 ENCOUNTER — Ambulatory Visit (INDEPENDENT_AMBULATORY_CARE_PROVIDER_SITE_OTHER): Payer: Medicare Other | Admitting: Family Medicine

## 2022-06-24 ENCOUNTER — Encounter: Payer: Self-pay | Admitting: Family Medicine

## 2022-06-24 ENCOUNTER — Ambulatory Visit: Payer: Medicare Other | Admitting: Family Medicine

## 2022-06-24 ENCOUNTER — Encounter (HOSPITAL_COMMUNITY): Payer: Self-pay | Admitting: *Deleted

## 2022-06-24 VITALS — BP 112/80 | HR 75 | Temp 97.9°F | Ht 71.0 in | Wt 212.6 lb

## 2022-06-24 DIAGNOSIS — E1129 Type 2 diabetes mellitus with other diabetic kidney complication: Secondary | ICD-10-CM | POA: Diagnosis not present

## 2022-06-24 DIAGNOSIS — E1165 Type 2 diabetes mellitus with hyperglycemia: Secondary | ICD-10-CM | POA: Diagnosis not present

## 2022-06-24 DIAGNOSIS — Z89511 Acquired absence of right leg below knee: Secondary | ICD-10-CM

## 2022-06-24 DIAGNOSIS — Z89512 Acquired absence of left leg below knee: Secondary | ICD-10-CM

## 2022-06-24 DIAGNOSIS — E785 Hyperlipidemia, unspecified: Secondary | ICD-10-CM | POA: Diagnosis not present

## 2022-06-24 DIAGNOSIS — Z794 Long term (current) use of insulin: Secondary | ICD-10-CM | POA: Diagnosis not present

## 2022-06-24 LAB — LIPID PANEL
Cholesterol: 132 mg/dL (ref 0–200)
HDL: 38.5 mg/dL — ABNORMAL LOW (ref >39.00)
LDL Cholesterol: 57 mg/dL (ref 0–99)
NonHDL: 93.01
Total CHOL/HDL Ratio: 3
Triglycerides: 181 mg/dL — ABNORMAL HIGH (ref 0.0–149.0)
VLDL: 36.2 mg/dL (ref 0.0–40.0)

## 2022-06-24 LAB — HEPATIC FUNCTION PANEL
ALT: 17 U/L (ref 0–53)
AST: 23 U/L (ref 0–37)
Albumin: 3.8 g/dL (ref 3.5–5.2)
Alkaline Phosphatase: 67 U/L (ref 39–117)
Bilirubin, Direct: 0.1 mg/dL (ref 0.0–0.3)
Total Bilirubin: 0.4 mg/dL (ref 0.2–1.2)
Total Protein: 7.7 g/dL (ref 6.0–8.3)

## 2022-06-24 LAB — POCT GLYCOSYLATED HEMOGLOBIN (HGB A1C): Hemoglobin A1C: 8.7 % — AB (ref 4.0–5.6)

## 2022-06-24 MED ORDER — FENOFIBRATE 160 MG PO TABS: ORAL_TABLET | ORAL | 3 refills | Status: DC

## 2022-06-24 MED ORDER — GABAPENTIN 300 MG PO CAPS: ORAL_CAPSULE | ORAL | 3 refills | Status: DC

## 2022-06-24 MED ORDER — ATORVASTATIN CALCIUM 80 MG PO TABS: ORAL_TABLET | ORAL | 3 refills | Status: DC

## 2022-06-24 NOTE — Patient Instructions (Signed)
SET UP ONE HOUR FOLLOW UP FOR CYST EXCISION  Increase Lantus to 16 units daily and increase by 2 units every 3 days until fasting sugars consistently around 130 or less  A1C today was 8.7%.

## 2022-06-24 NOTE — Progress Notes (Signed)
Established Patient Office Visit  Subjective   Patient ID: Anthony Bullock, male    DOB: 24-May-1960  Age: 62 y.o. MRN: [id number]  Chief Complaint  Patient presents with   Medical Management of Chronic Issues    Follow up on DM. Pt reports he had blood drawn yesterday at dialysis. Need a written Rx for amputate supply to be fax to [facility].     HPI   Anthony Bullock is here for routine medical follow-up.  He has multiple chronic problems including history of CAD, systolic heart failure, hypertension, COPD, type 2 diabetes with peripheral neuropathy.  Osteoarthritis involving multiple joints.  End-stage renal disease on hemodialysis, history of bilateral below-knee amputations  He is followed by nephrology regularly.  He states he had some labs drawn yesterday.  He has dyslipidemia treated with atorvastatin 80 mg daily and due for follow-up labs.  Also takes fenofibrate.  He is requesting refills of gabapentin.  Longstanding history of neuropathy.  He needs prosthetic supplies including socks and liners for his prostheses.  Requesting prescription  He is currently on Lantus 14 units daily and he uses Humalog by sliding scale.  Recent fasting blood sugars of upper 150-180 range.  No hypoglycemia.  Does not need further urine microalbumin since he is on dialysis  Past Medical History:  Diagnosis Date   AICD (automatic cardioverter/defibrillator) present    boston [employer]   Allergic rhinitis    Anemia    Arthritis    Chronic systolic heart failure (HCC)    a. ECHO (12/2012) EF 25-30%, HK entireanteroseptal myocardium //  b.  EF 25%, diffuse HK, grade 1 diastolic dysfunction, MAC, mild LAE, normal RVSF, trivial pericardial effusion   COPD (chronic obstructive pulmonary disease) (HCC)    Diabetes mellitus type II    Diabetic nephropathy (HCC)    Diabetic neuropathy (HCC)    ESRD on hemodialysis (HCC)    started HD June 2017, goes to [facility] HD unit, Dr Fausto Skillern   History of cardiac  catheterization    a.Myoview 1/15:  There is significant left ventricular dysfunction. There may be slight scar at the apex. There is no significant ischemia. LV Ejection Fraction: 27%  //  b. RHC/LHC (1/15) with mean RA 6, PA 47/22 mean 33, mean PCWP 20, PVR 2.5 WU, CI 2.5; 80% dLAD stenosis, 70% diffuse large D.     History of kidney stones    Hyperlipidemia    Hypertension    Kidney stones    NICM (nonischemic cardiomyopathy) (HCC)    Primarily nonischemic.  Echo (12/14) with EF 25-30%.  Echo (3/15) with EF 25%, mild to moderately dilated LV, normal RV size and systolic function.     Osteomyelitis (HCC)    left fifth ray   Pneumonia    Urethral stricture    Wears glasses    Past Surgical History:  Procedure Laterality Date   ABDOMINAL AORTOGRAM W/LOWER EXTREMITY N/A 03/30/2016   Procedure: Abdominal Aortogram w/Lower Extremity;  Surgeon: Chuck Hint, MD;  Location: [facility] INVASIVE CV LAB;  Service: Cardiovascular;  Laterality: N/A;   AMPUTATION Right 04/26/2016   Procedure: Right Below Knee Amputation;  Surgeon: Nadara Mustard, MD;  Location: [facility] OR;  Service: Orthopedics;  Laterality: Right;   AMPUTATION Left 08/21/2019   Procedure: LEFT FOOT 5TH RAY AMPUTATION;  Surgeon: Nadara Mustard, MD;  Location: [facility] OR;  Service: Orthopedics;  Laterality: Left;   AMPUTATION Left 11/13/2019   Procedure: LEFT BELOW KNEE AMPUTATION;  Surgeon: Lajoyce Corners,  Randa Evens, MD;  Location: [facility] OR;  Service: Orthopedics;  Laterality: Left;   AV FISTULA PLACEMENT Right 09/08/2015   Procedure: INSERTION OF 4-31mm x 45cm  ARTERIOVENOUS (AV) GORE-TEX GRAFT RIGHT UPPER  ARM;  Surgeon: Chuck Hint, MD;  Location: [facility] OR;  Service: Vascular;  Laterality: Right;   AV FISTULA PLACEMENT Left 01/14/2016   Procedure: CREATION OF LEFT UPPER ARM ARTERIOVENOUS FISTULA;  Surgeon: Chuck Hint, MD;  Location: [facility] OR;  Service: Vascular;  Laterality: Left;   BASCILIC VEIN TRANSPOSITION Right 08/22/2014   Procedure: RIGHT  UPPER ARM BASCILIC VEIN TRANSPOSITION;  Surgeon: Chuck Hint, MD;  Location: [facility] OR;  Service: Vascular;  Laterality: Right;   BELOW KNEE LEG AMPUTATION Right 04/26/2016   CARDIAC CATHETERIZATION     CARDIAC DEFIBRILLATOR PLACEMENT  06/27/2013   Sub Q       BY DR Graciela Husbands   CATARACT EXTRACTION W/PHACO Right 08/06/2018   Procedure: CATARACT EXTRACTION PHACO AND INTRAOCULAR LENS PLACEMENT (IOC);  Surgeon: Fabio Pierce, MD;  Location: [facility] ORS;  Service: Ophthalmology;  Laterality: Right;  CDE: 4.06   CATARACT EXTRACTION W/PHACO Left 08/20/2018   Procedure: CATARACT EXTRACTION PHACO AND INTRAOCULAR LENS PLACEMENT (IOC);  Surgeon: Fabio Pierce, MD;  Location: [facility] ORS;  Service: Ophthalmology;  Laterality: Left;  CDE: 6.76   COLONOSCOPY WITH PROPOFOL N/A 07/22/2015   Procedure: COLONOSCOPY WITH PROPOFOL;  Surgeon: Sherrilyn Rist, MD;  Location: [facility];  Service: Gastroenterology;  Laterality: N/A;   FEMORAL-POPLITEAL BYPASS GRAFT Right 03/31/2016   Procedure: BYPASS GRAFT FEMORAL-POPLITEAL ARTERY USING RIGHT GREATER SAPHENOUS NONREVERSED VEIN;  Surgeon: Chuck Hint, MD;  Location: [facility] OR;  Service: Vascular;  Laterality: Right;   HERNIA REPAIR     I & D EXTREMITY Right 03/31/2016   Procedure: IRRIGATION AND DEBRIDEMENT FOOT;  Surgeon: Chuck Hint, MD;  Location: [facility] OR;  Service: Vascular;  Laterality: Right;   IMPLANTABLE CARDIOVERTER DEFIBRILLATOR IMPLANT N/A 06/27/2013   Procedure: SUB Q ICD;  Surgeon: Duke Salvia, MD;  Location: [facility] CATH LAB;  Service: Cardiovascular;  Laterality: N/A;   INTRAOPERATIVE ARTERIOGRAM Right 03/31/2016   Procedure: INTRA OPERATIVE ARTERIOGRAM;  Surgeon: Chuck Hint, MD;  Location: [facility] OR;  Service: Vascular;  Laterality: Right;   IR GENERIC HISTORICAL Right 11/30/2015   IR THROMBECTOMY AV FISTULA W/THROMBOLYSIS/PTA INC/SHUNT/IMG RIGHT 11/30/2015 Irish Lack, MD [facility]   IR GENERIC HISTORICAL  11/30/2015   IR US GUIDE  VASC ACCESS RIGHT 11/30/2015 Irish Lack, MD [facility]   IR GENERIC HISTORICAL Right 12/15/2015   IR THROMBECTOMY AV FISTULA W/THROMBOLYSIS/PTA/STENT INC/SHUNT/IMG RT 12/15/2015 Oley Balm, MD [facility]   IR GENERIC HISTORICAL  12/15/2015   IR US GUIDE VASC ACCESS RIGHT 12/15/2015 Oley Balm, MD [facility]   IR GENERIC HISTORICAL  12/28/2015   IR FLUORO GUIDE CV LINE RIGHT 12/28/2015 Jolaine Click, MD [facility]   IR GENERIC HISTORICAL  12/28/2015   IR US GUIDE VASC ACCESS RIGHT 12/28/2015 Jolaine Click, MD [facility]   LEFT A ND RIGHT HEART CATH  01/30/2013   DR Jones Broom   LEFT AND RIGHT HEART CATHETERIZATION WITH CORONARY ANGIOGRAM N/A 01/30/2013   Procedure: LEFT AND RIGHT HEART CATHETERIZATION WITH CORONARY ANGIOGRAM;  Surgeon: Dolores Patty, MD;  Location: [facility] CATH LAB;  Service: Cardiovascular;  Laterality: N/A;   PERIPHERAL VASCULAR CATHETERIZATION Right 01/26/2015   Procedure: A/V Fistulagram;  Surgeon: Chuck Hint, MD;  Location: [facility] INVASIVE CV LAB;  Service: Cardiovascular;  Laterality: Right;  reapea urethral surgery for recurrent obstruction  2011   TOTAL KNEE ARTHROPLASTY Right 2007   VEIN HARVEST Right 03/31/2016   Procedure: RIGHT GREATER SAPHENOUS VEIN HARVEST;  Surgeon: Chuck Hint, MD;  Location: [facility] OR;  Service: Vascular;  Laterality: Right;    reports that he quit smoking about 12 years ago. His smoking use included cigarettes. He has a 64.00 pack-year smoking history. He has never used smokeless tobacco. He reports that he does not drink alcohol and does not use drugs. family history includes Alcohol abuse in his father; Bladder Cancer in his mother; Diabetes in his maternal grandmother; Heart Problems in his maternal grandmother; Heart disease in his maternal grandfather; Melanoma in his father; Prostate cancer in his maternal grandfather; Stroke in his maternal grandmother. Allergies  Allergen Reactions   Epoetin Alfa Other (See  Comments)    unknown   Ferumoxytol Other (See Comments)    unknown   Morphine Sulfate Rash and Other (See Comments)    Itches all over, red spots    Review of Systems  Constitutional:  Negative for malaise/fatigue.  Eyes:  Negative for blurred vision.  Respiratory:  Negative for shortness of breath.   Cardiovascular:  Negative for chest pain.  Gastrointestinal:  Negative for abdominal pain.  Neurological:  Negative for dizziness, weakness and headaches.      Objective:     BP 112/80 (BP Location: Right Arm, Patient Position: Sitting, Cuff Size: Normal)   Pulse 75   Temp 97.9 F (36.6 C) (Oral)   Ht 5\' 11"  (1.803 m)   Wt 212 lb 9.6 oz (96.4 kg)   SpO2 97%   BMI 29.65 kg/m    Physical Exam Vitals reviewed.  Constitutional:      Appearance: He is well-developed.  Eyes:     Pupils: Pupils are equal, round, and reactive to light.  Neck:     Thyroid: No thyromegaly.  Cardiovascular:     Rate and Rhythm: Normal rate and regular rhythm.  Pulmonary:     Effort: Pulmonary effort is normal. No respiratory distress.     Breath sounds: Normal breath sounds. No wheezing or rales.  Musculoskeletal:     Cervical back: Neck supple.  Neurological:     Mental Status: He is alert and oriented to person, place, and time.      Results for orders placed or performed in visit on 06/24/22  POC HgB A1c  Result Value Ref Range   Hemoglobin A1C 8.7 (A) 4.0 - 5.6 %   HbA1c POC (<> result, manual entry)     HbA1c, POC (prediabetic range)     HbA1c, POC (controlled diabetic range)        The ASCVD Risk score (Arnett DK, et al., 2019) failed to calculate for the following reasons:   The valid total cholesterol range is 130 to 320 mg/dL    Assessment & Plan:   Problem List Items Addressed This Visit       Unprioritized   Poorly controlled type II diabetes mellitus with renal complication (HCC) - Primary   Relevant Medications   atorvastatin (LIPITOR) 80 MG tablet   Other  Relevant Orders   POC HgB A1c (Completed)   Hyperlipidemia   Relevant Medications   atorvastatin (LIPITOR) 80 MG tablet   fenofibrate 160 MG tablet   Other Relevant Orders   Lipid panel   Hepatic function panel   Other Visit Diagnoses     S/P bilateral BKA (below knee amputation) (HCC)  Relevant Orders   For home use only DME Other see comment     -Blood sugar poorly controlled with A1c today 8.7%.  Recent increase in fasting sugars.  We recommend increase his Lantus to 16 units daily and slowly titrate up 2 units every 3 days until fasting blood sugars around 130 or less. -Also asked that he get [name] a copy of his current sliding scale insulin he is using for the Humalog -Set up 96-month follow-up to reassess -Prescription supplied for socks and liners for his bilateral prostheses -Check labs today including lipid panel and hepatic panel which is overdue -Recommend continue yearly diabetic eye exam -No indication for urine microalbumin screen with end-stage renal disease on hemodialysis  No follow-ups on file.    Evelena Peat, MD

## 2022-06-25 DIAGNOSIS — N2581 Secondary hyperparathyroidism of renal origin: Secondary | ICD-10-CM | POA: Diagnosis not present

## 2022-06-25 DIAGNOSIS — N186 End stage renal disease: Secondary | ICD-10-CM | POA: Diagnosis not present

## 2022-06-25 DIAGNOSIS — D509 Iron deficiency anemia, unspecified: Secondary | ICD-10-CM | POA: Diagnosis not present

## 2022-06-25 DIAGNOSIS — D631 Anemia in chronic kidney disease: Secondary | ICD-10-CM | POA: Diagnosis not present

## 2022-06-25 DIAGNOSIS — Z992 Dependence on renal dialysis: Secondary | ICD-10-CM | POA: Diagnosis not present

## 2022-06-28 DIAGNOSIS — Z992 Dependence on renal dialysis: Secondary | ICD-10-CM | POA: Diagnosis not present

## 2022-06-28 DIAGNOSIS — N2581 Secondary hyperparathyroidism of renal origin: Secondary | ICD-10-CM | POA: Diagnosis not present

## 2022-06-28 DIAGNOSIS — D509 Iron deficiency anemia, unspecified: Secondary | ICD-10-CM | POA: Diagnosis not present

## 2022-06-28 DIAGNOSIS — N186 End stage renal disease: Secondary | ICD-10-CM | POA: Diagnosis not present

## 2022-06-28 DIAGNOSIS — D631 Anemia in chronic kidney disease: Secondary | ICD-10-CM | POA: Diagnosis not present

## 2022-06-30 DIAGNOSIS — D631 Anemia in chronic kidney disease: Secondary | ICD-10-CM | POA: Diagnosis not present

## 2022-06-30 DIAGNOSIS — N2581 Secondary hyperparathyroidism of renal origin: Secondary | ICD-10-CM | POA: Diagnosis not present

## 2022-06-30 DIAGNOSIS — N186 End stage renal disease: Secondary | ICD-10-CM | POA: Diagnosis not present

## 2022-06-30 DIAGNOSIS — D509 Iron deficiency anemia, unspecified: Secondary | ICD-10-CM | POA: Diagnosis not present

## 2022-06-30 DIAGNOSIS — Z992 Dependence on renal dialysis: Secondary | ICD-10-CM | POA: Diagnosis not present

## 2022-07-02 DIAGNOSIS — Z992 Dependence on renal dialysis: Secondary | ICD-10-CM | POA: Diagnosis not present

## 2022-07-02 DIAGNOSIS — D509 Iron deficiency anemia, unspecified: Secondary | ICD-10-CM | POA: Diagnosis not present

## 2022-07-02 DIAGNOSIS — D631 Anemia in chronic kidney disease: Secondary | ICD-10-CM | POA: Diagnosis not present

## 2022-07-02 DIAGNOSIS — N186 End stage renal disease: Secondary | ICD-10-CM | POA: Diagnosis not present

## 2022-07-02 DIAGNOSIS — N2581 Secondary hyperparathyroidism of renal origin: Secondary | ICD-10-CM | POA: Diagnosis not present

## 2022-07-05 DIAGNOSIS — D631 Anemia in chronic kidney disease: Secondary | ICD-10-CM | POA: Diagnosis not present

## 2022-07-05 DIAGNOSIS — D509 Iron deficiency anemia, unspecified: Secondary | ICD-10-CM | POA: Diagnosis not present

## 2022-07-05 DIAGNOSIS — N2581 Secondary hyperparathyroidism of renal origin: Secondary | ICD-10-CM | POA: Diagnosis not present

## 2022-07-05 DIAGNOSIS — Z992 Dependence on renal dialysis: Secondary | ICD-10-CM | POA: Diagnosis not present

## 2022-07-05 DIAGNOSIS — N186 End stage renal disease: Secondary | ICD-10-CM | POA: Diagnosis not present

## 2022-07-06 ENCOUNTER — Encounter (HOSPITAL_COMMUNITY): Payer: Self-pay | Admitting: *Deleted

## 2022-07-07 DIAGNOSIS — Z992 Dependence on renal dialysis: Secondary | ICD-10-CM | POA: Diagnosis not present

## 2022-07-07 DIAGNOSIS — N186 End stage renal disease: Secondary | ICD-10-CM | POA: Diagnosis not present

## 2022-07-07 DIAGNOSIS — N2581 Secondary hyperparathyroidism of renal origin: Secondary | ICD-10-CM | POA: Diagnosis not present

## 2022-07-07 DIAGNOSIS — D631 Anemia in chronic kidney disease: Secondary | ICD-10-CM | POA: Diagnosis not present

## 2022-07-07 DIAGNOSIS — D509 Iron deficiency anemia, unspecified: Secondary | ICD-10-CM | POA: Diagnosis not present

## 2022-07-09 DIAGNOSIS — Z992 Dependence on renal dialysis: Secondary | ICD-10-CM | POA: Diagnosis not present

## 2022-07-09 DIAGNOSIS — D631 Anemia in chronic kidney disease: Secondary | ICD-10-CM | POA: Diagnosis not present

## 2022-07-09 DIAGNOSIS — N2581 Secondary hyperparathyroidism of renal origin: Secondary | ICD-10-CM | POA: Diagnosis not present

## 2022-07-09 DIAGNOSIS — D509 Iron deficiency anemia, unspecified: Secondary | ICD-10-CM | POA: Diagnosis not present

## 2022-07-09 DIAGNOSIS — N186 End stage renal disease: Secondary | ICD-10-CM | POA: Diagnosis not present

## 2022-07-10 DIAGNOSIS — N186 End stage renal disease: Secondary | ICD-10-CM | POA: Diagnosis not present

## 2022-07-10 DIAGNOSIS — Z992 Dependence on renal dialysis: Secondary | ICD-10-CM | POA: Diagnosis not present

## 2022-07-12 DIAGNOSIS — N186 End stage renal disease: Secondary | ICD-10-CM | POA: Diagnosis not present

## 2022-07-12 DIAGNOSIS — N250 Renal osteodystrophy: Secondary | ICD-10-CM | POA: Diagnosis not present

## 2022-07-12 DIAGNOSIS — D509 Iron deficiency anemia, unspecified: Secondary | ICD-10-CM | POA: Diagnosis not present

## 2022-07-12 DIAGNOSIS — Z992 Dependence on renal dialysis: Secondary | ICD-10-CM | POA: Diagnosis not present

## 2022-07-12 DIAGNOSIS — N2581 Secondary hyperparathyroidism of renal origin: Secondary | ICD-10-CM | POA: Diagnosis not present

## 2022-07-13 ENCOUNTER — Other Ambulatory Visit: Payer: Self-pay | Admitting: Family Medicine

## 2022-07-14 DIAGNOSIS — D509 Iron deficiency anemia, unspecified: Secondary | ICD-10-CM | POA: Diagnosis not present

## 2022-07-14 DIAGNOSIS — N250 Renal osteodystrophy: Secondary | ICD-10-CM | POA: Diagnosis not present

## 2022-07-14 DIAGNOSIS — N186 End stage renal disease: Secondary | ICD-10-CM | POA: Diagnosis not present

## 2022-07-14 DIAGNOSIS — N2581 Secondary hyperparathyroidism of renal origin: Secondary | ICD-10-CM | POA: Diagnosis not present

## 2022-07-14 DIAGNOSIS — Z992 Dependence on renal dialysis: Secondary | ICD-10-CM | POA: Diagnosis not present

## 2022-07-15 ENCOUNTER — Ambulatory Visit (INDEPENDENT_AMBULATORY_CARE_PROVIDER_SITE_OTHER): Payer: Medicare Other | Admitting: Family Medicine

## 2022-07-15 ENCOUNTER — Encounter: Payer: Self-pay | Admitting: Family Medicine

## 2022-07-15 VITALS — BP 104/70 | HR 82 | Temp 98.0°F | Ht 71.0 in | Wt 214.0 lb

## 2022-07-15 DIAGNOSIS — L720 Epidermal cyst: Secondary | ICD-10-CM

## 2022-07-15 DIAGNOSIS — Z9181 History of falling: Secondary | ICD-10-CM | POA: Diagnosis not present

## 2022-07-15 DIAGNOSIS — Z89512 Acquired absence of left leg below knee: Secondary | ICD-10-CM | POA: Diagnosis not present

## 2022-07-15 DIAGNOSIS — Z89511 Acquired absence of right leg below knee: Secondary | ICD-10-CM | POA: Diagnosis not present

## 2022-07-15 NOTE — Progress Notes (Signed)
Established Patient Office Visit  Subjective   Patient ID: Anthony Bullock, male    DOB: Jul 10, 1960  Age: 62 y.o. MRN: [id number]  Chief Complaint  Patient presents with   Procedure    HPI   Anthony Bullock is here today for cyst excision but also requesting the following items  He has history of bilateral below-knee amputations.  Gets around remarkably well with cane currently but feels at high risk for fall.  He is requesting a rollator walker which seems very reasonable especially with more prolonged being up on his feet.  He has longstanding history of type 2 diabetes which is insulin-dependent.  End-stage renal disease on hemodialysis.  Recent A1c was up slightly 8.7.  He is on Lantus.  He uses Humalog sliding scale.  No recent hypoglycemia.  Cyst right side of scalp.  This was excised several years ago but eventually regrew.  This is irritating because of brushing hair and he would like to have this reexcised.  No recent infection.  Past Medical History:  Diagnosis Date   AICD (automatic cardioverter/defibrillator) present    [employer]   Allergic rhinitis    Anemia    Arthritis    Chronic systolic heart failure (HCC)    a. ECHO (12/2012) EF 25-30%, HK entireanteroseptal myocardium //  b.  EF 25%, diffuse HK, grade 1 diastolic dysfunction, MAC, mild LAE, normal RVSF, trivial pericardial effusion   COPD (chronic obstructive pulmonary disease) (HCC)    Diabetes mellitus type II    Diabetic nephropathy (HCC)    Diabetic neuropathy (HCC)    ESRD on hemodialysis (HCC)    started HD June 2017, goes to [facility] HD unit, Dr Fausto Skillern   History of cardiac catheterization    a.Myoview 1/15:  There is significant left ventricular dysfunction. There may be slight scar at the apex. There is no significant ischemia. LV Ejection Fraction: 27%  //  b. RHC/LHC (1/15) with mean RA 6, PA 47/22 mean 33, mean PCWP 20, PVR 2.5 WU, CI 2.5; 80% dLAD stenosis, 70% diffuse large D.     History of  kidney stones    Hyperlipidemia    Hypertension    Kidney stones    NICM (nonischemic cardiomyopathy) (HCC)    Primarily nonischemic.  Echo (12/14) with EF 25-30%.  Echo (3/15) with EF 25%, mild to moderately dilated LV, normal RV size and systolic function.     Osteomyelitis (HCC)    left fifth ray   Pneumonia    Urethral stricture    Wears glasses    Past Surgical History:  Procedure Laterality Date   ABDOMINAL AORTOGRAM W/LOWER EXTREMITY N/A 03/30/2016   Procedure: Abdominal Aortogram w/Lower Extremity;  Surgeon: Chuck Hint, MD;  Location: [facility] INVASIVE CV LAB;  Service: Cardiovascular;  Laterality: N/A;   AMPUTATION Right 04/26/2016   Procedure: Right Below Knee Amputation;  Surgeon: Nadara Mustard, MD;  Location: [facility] OR;  Service: Orthopedics;  Laterality: Right;   AMPUTATION Left 08/21/2019   Procedure: LEFT FOOT 5TH RAY AMPUTATION;  Surgeon: Nadara Mustard, MD;  Location: [facility] OR;  Service: Orthopedics;  Laterality: Left;   AMPUTATION Left 11/13/2019   Procedure: LEFT BELOW KNEE AMPUTATION;  Surgeon: Nadara Mustard, MD;  Location: [facility] OR;  Service: Orthopedics;  Laterality: Left;   AV FISTULA PLACEMENT Right 09/08/2015   Procedure: INSERTION OF 4-71mm x 45cm  ARTERIOVENOUS (AV) GORE-TEX GRAFT RIGHT UPPER  ARM;  Surgeon: Chuck Hint, MD;  Location: [facility] OR;  Service: Vascular;  Laterality: Right;   AV FISTULA PLACEMENT Left 01/14/2016   Procedure: CREATION OF LEFT UPPER ARM ARTERIOVENOUS FISTULA;  Surgeon: Chuck Hint, MD;  Location: [facility] OR;  Service: Vascular;  Laterality: Left;   BASCILIC VEIN TRANSPOSITION Right 08/22/2014   Procedure: RIGHT UPPER ARM BASCILIC VEIN TRANSPOSITION;  Surgeon: Chuck Hint, MD;  Location: [facility] OR;  Service: Vascular;  Laterality: Right;   BELOW KNEE LEG AMPUTATION Right 04/26/2016   CARDIAC CATHETERIZATION     CARDIAC DEFIBRILLATOR PLACEMENT  06/27/2013   Sub Q       BY DR Graciela Husbands   CATARACT EXTRACTION W/PHACO Right 08/06/2018    Procedure: CATARACT EXTRACTION PHACO AND INTRAOCULAR LENS PLACEMENT (IOC);  Surgeon: Fabio Pierce, MD;  Location: [facility] ORS;  Service: Ophthalmology;  Laterality: Right;  CDE: 4.06   CATARACT EXTRACTION W/PHACO Left 08/20/2018   Procedure: CATARACT EXTRACTION PHACO AND INTRAOCULAR LENS PLACEMENT (IOC);  Surgeon: Fabio Pierce, MD;  Location: [facility] ORS;  Service: Ophthalmology;  Laterality: Left;  CDE: 6.76   COLONOSCOPY WITH PROPOFOL N/A 07/22/2015   Procedure: COLONOSCOPY WITH PROPOFOL;  Surgeon: Sherrilyn Rist, MD;  Location: [facility] ENDOSCOPY;  Service: Gastroenterology;  Laterality: N/A;   FEMORAL-POPLITEAL BYPASS GRAFT Right 03/31/2016   Procedure: BYPASS GRAFT FEMORAL-POPLITEAL ARTERY USING RIGHT GREATER SAPHENOUS NONREVERSED VEIN;  Surgeon: Chuck Hint, MD;  Location: [facility] OR;  Service: Vascular;  Laterality: Right;   HERNIA REPAIR     I & D EXTREMITY Right 03/31/2016   Procedure: IRRIGATION AND DEBRIDEMENT FOOT;  Surgeon: Chuck Hint, MD;  Location: [facility] OR;  Service: Vascular;  Laterality: Right;   IMPLANTABLE CARDIOVERTER DEFIBRILLATOR IMPLANT N/A 06/27/2013   Procedure: SUB Q ICD;  Surgeon: Duke Salvia, MD;  Location: [facility] CATH LAB;  Service: Cardiovascular;  Laterality: N/A;   INTRAOPERATIVE ARTERIOGRAM Right 03/31/2016   Procedure: INTRA OPERATIVE ARTERIOGRAM;  Surgeon: Chuck Hint, MD;  Location: [facility] OR;  Service: Vascular;  Laterality: Right;   IR GENERIC HISTORICAL Right 11/30/2015   IR THROMBECTOMY AV FISTULA W/THROMBOLYSIS/PTA INC/SHUNT/IMG RIGHT 11/30/2015 Irish Lack, MD [facility]   IR GENERIC HISTORICAL  11/30/2015   IR US GUIDE VASC ACCESS RIGHT 11/30/2015 Irish Lack, MD [facility]   IR GENERIC HISTORICAL Right 12/15/2015   IR THROMBECTOMY AV FISTULA W/THROMBOLYSIS/PTA/STENT INC/SHUNT/IMG RT 12/15/2015 Oley Balm, MD [facility]   IR GENERIC HISTORICAL  12/15/2015   IR US GUIDE VASC ACCESS RIGHT 12/15/2015 Oley Balm, MD [facility]    IR GENERIC HISTORICAL  12/28/2015   IR FLUORO GUIDE CV LINE RIGHT 12/28/2015 Jolaine Click, MD [facility]   IR GENERIC HISTORICAL  12/28/2015   IR US GUIDE VASC ACCESS RIGHT 12/28/2015 Jolaine Click, MD [facility]   LEFT A ND RIGHT HEART CATH  01/30/2013   DR Jones Broom   LEFT AND RIGHT HEART CATHETERIZATION WITH CORONARY ANGIOGRAM N/A 01/30/2013   Procedure: LEFT AND RIGHT HEART CATHETERIZATION WITH CORONARY ANGIOGRAM;  Surgeon: Dolores Patty, MD;  Location: [facility] CATH LAB;  Service: Cardiovascular;  Laterality: N/A;   PERIPHERAL VASCULAR CATHETERIZATION Right 01/26/2015   Procedure: A/V Fistulagram;  Surgeon: Chuck Hint, MD;  Location: [facility] INVASIVE CV LAB;  Service: Cardiovascular;  Laterality: Right;   reapea urethral surgery for recurrent obstruction  2011   TOTAL KNEE ARTHROPLASTY Right 2007   VEIN HARVEST Right 03/31/2016   Procedure: RIGHT GREATER SAPHENOUS VEIN HARVEST;  Surgeon: Chuck Hint, MD;  Location: [facility] OR;  Service: Vascular;  Laterality: Right;  reports that he quit smoking about 12 years ago. His smoking use included cigarettes. He has a 64.00 pack-year smoking history. He has never used smokeless tobacco. He reports that he does not drink alcohol and does not use drugs. family history includes Alcohol abuse in his father; Bladder Cancer in his mother; Diabetes in his maternal grandmother; Heart Problems in his maternal grandmother; Heart disease in his maternal grandfather; Melanoma in his father; Prostate cancer in his maternal grandfather; Stroke in his maternal grandmother. Allergies  Allergen Reactions   Epoetin Alfa Other (See Comments)    unknown   Ferumoxytol Other (See Comments)    unknown   Morphine Sulfate Rash and Other (See Comments)    Itches all over, red spots     Review of Systems  Constitutional:  Negative for chills and fever.  Respiratory:  Negative for shortness of breath.   Cardiovascular:  Negative for chest pain.       Objective:     BP 104/70 (BP Location: Left Arm, Patient Position: Sitting, Cuff Size: Normal)   Pulse 82   Temp 98 F (36.7 C) (Oral)   Ht 5\' 11"  (1.803 m)   Wt 214 lb (97.1 kg)   SpO2 95%   BMI 29.85 kg/m  BP Readings from Last 3 Encounters:  07/15/22 104/70  06/24/22 112/80  06/02/22 (!) 142/72   Wt Readings from Last 3 Encounters:  07/15/22 214 lb (97.1 kg)  06/24/22 212 lb 9.6 oz (96.4 kg)  06/02/22 207 lb (93.9 kg)      Physical Exam Vitals reviewed.  Constitutional:      General: He is not in acute distress.    Appearance: He is not ill-appearing.  Cardiovascular:     Rate and Rhythm: Normal rate and regular rhythm.  Pulmonary:     Effort: Pulmonary effort is normal.     Breath sounds: Normal breath sounds.  Skin:    Comments: Right side of scalp parietal area reveals proximately 1 and 1/2 cm slightly mobile cystic lesion.  No overlying erythema.  Nontender to palpation.  No fluctuance.  Neurological:     Mental Status: He is alert.      No results found for any visits on 07/15/22.  Last CBC Lab Results  Component Value Date   WBC 8.0 11/28/2019   HGB 10.0 (L) 11/28/2019   HCT 32.1 (L) 11/28/2019   MCV 92.5 11/28/2019   MCH 28.8 11/28/2019   RDW 15.7 (H) 11/28/2019   PLT 148 (L) 11/28/2019   Last metabolic panel Lab Results  Component Value Date   GLUCOSE 139 (H) 11/28/2019   NA 134 (L) 11/28/2019   K 5.6 (H) 11/28/2019   CL 96 (L) 11/28/2019   CO2 26 11/28/2019   BUN 63 (H) 11/28/2019   CREATININE 10.21 (H) 11/28/2019   GFRNONAA 5 (L) 11/28/2019   CALCIUM 9.2 11/28/2019   PHOS 3.8 11/28/2019   PROT 7.7 06/24/2022   ALBUMIN 3.8 06/24/2022   BILITOT 0.4 06/24/2022   ALKPHOS 67 06/24/2022   AST 23 06/24/2022   ALT 17 06/24/2022   ANIONGAP 12 11/28/2019   Last lipids Lab Results  Component Value Date   CHOL 132 06/24/2022   HDL 38.50 (L) 06/24/2022   LDLCALC 57 06/24/2022   LDLDIRECT 134.0 04/25/2011   TRIG 181.0 (H) 06/24/2022    CHOLHDL 3 06/24/2022   Last hemoglobin A1c Lab Results  Component Value Date   HGBA1C 8.7 (A) 06/24/2022      The  10-year ASCVD risk score (Arnett DK, et al., 2019) is: 17.1%    Assessment & Plan:   #1 benign appearing epidermal inclusion cyst right scalp.  Patient requesting excision.  This has been excised once previously regrew.  We discussed risk of excision including risk of bleeding, low risk of infection, risk of scarring.  We also discussed the fact that epidermal cyst can easily regrow if not fully excised.  Patient consented after discussion of the above -Scalp prepped with Betadine -Local anesthesia with 1% Xylocaine with epinephrine -Sterile technique used throughout -Using #15 blade made 2 cm elliptical incision.  Minimal bleeding.  We carefully dissected down until we identified the epidermal cyst sac.  We expressed inner contents of cyst sac then gently and gradually dissected away the cyst with surgical scissors until the base look clear.  Irrigated wound cavity several times.  No visible retained cyst.  Wound closed with 4 sutures of 4-0 Ethilon.  Topical Vaseline applied. -Wound care instruction given -Return in 10 days for suture removal -Patient tolerated well  #2 moderate to high risk for falls.  History of bilateral below-knee amputations which places him at high risk.  Prescription sent for rollator walker to use as needed  #3 type 2 diabetes dependent on insulin.  Refill Lantus.  Continue sliding scale Humalog.  Recheck A1c within a few months   Return in about 10 days (around 07/25/2022).    Evelena Peat, MD

## 2022-07-15 NOTE — Patient Instructions (Signed)
Keep wound dry for the first 24 hours then clean daily with soap and water for one week. Apply vaseline daily for 5-6 days. Follow up promptly for any signs of infection such as redness, warmth, pain, or drainage. Follow up in about 10 days for suture removal.

## 2022-07-16 DIAGNOSIS — N250 Renal osteodystrophy: Secondary | ICD-10-CM | POA: Diagnosis not present

## 2022-07-16 DIAGNOSIS — D509 Iron deficiency anemia, unspecified: Secondary | ICD-10-CM | POA: Diagnosis not present

## 2022-07-16 DIAGNOSIS — N186 End stage renal disease: Secondary | ICD-10-CM | POA: Diagnosis not present

## 2022-07-16 DIAGNOSIS — Z992 Dependence on renal dialysis: Secondary | ICD-10-CM | POA: Diagnosis not present

## 2022-07-16 DIAGNOSIS — N2581 Secondary hyperparathyroidism of renal origin: Secondary | ICD-10-CM | POA: Diagnosis not present

## 2022-07-18 DIAGNOSIS — R0689 Other abnormalities of breathing: Secondary | ICD-10-CM | POA: Diagnosis not present

## 2022-07-18 DIAGNOSIS — R069 Unspecified abnormalities of breathing: Secondary | ICD-10-CM | POA: Diagnosis not present

## 2022-07-18 DIAGNOSIS — I10 Essential (primary) hypertension: Secondary | ICD-10-CM | POA: Diagnosis not present

## 2022-07-18 DIAGNOSIS — R0902 Hypoxemia: Secondary | ICD-10-CM | POA: Diagnosis not present

## 2022-07-18 DIAGNOSIS — I959 Hypotension, unspecified: Secondary | ICD-10-CM | POA: Diagnosis not present

## 2022-07-19 ENCOUNTER — Emergency Department (HOSPITAL_COMMUNITY)
Admission: EM | Admit: 2022-07-19 | Discharge: 2022-07-19 | Disposition: A | Discharge status: Home / Self Care | Payer: Medicare Other | Source: Home / Self Care | Attending: Emergency Medicine | Admitting: Emergency Medicine

## 2022-07-19 ENCOUNTER — Emergency Department (HOSPITAL_COMMUNITY): Payer: Medicare Other

## 2022-07-19 ENCOUNTER — Other Ambulatory Visit: Payer: Self-pay

## 2022-07-19 DIAGNOSIS — J9801 Acute bronchospasm: Secondary | ICD-10-CM | POA: Insufficient documentation

## 2022-07-19 DIAGNOSIS — N2581 Secondary hyperparathyroidism of renal origin: Secondary | ICD-10-CM | POA: Diagnosis not present

## 2022-07-19 DIAGNOSIS — Z7982 Long term (current) use of aspirin: Secondary | ICD-10-CM | POA: Diagnosis not present

## 2022-07-19 DIAGNOSIS — N250 Renal osteodystrophy: Secondary | ICD-10-CM | POA: Diagnosis not present

## 2022-07-19 DIAGNOSIS — J449 Chronic obstructive pulmonary disease, unspecified: Secondary | ICD-10-CM | POA: Insufficient documentation

## 2022-07-19 DIAGNOSIS — I132 Hypertensive heart and chronic kidney disease with heart failure and with stage 5 chronic kidney disease, or end stage renal disease: Secondary | ICD-10-CM | POA: Insufficient documentation

## 2022-07-19 DIAGNOSIS — Z794 Long term (current) use of insulin: Secondary | ICD-10-CM | POA: Insufficient documentation

## 2022-07-19 DIAGNOSIS — I700 Atherosclerosis of aorta: Secondary | ICD-10-CM | POA: Diagnosis not present

## 2022-07-19 DIAGNOSIS — N186 End stage renal disease: Secondary | ICD-10-CM | POA: Diagnosis not present

## 2022-07-19 DIAGNOSIS — D509 Iron deficiency anemia, unspecified: Secondary | ICD-10-CM | POA: Diagnosis not present

## 2022-07-19 DIAGNOSIS — E119 Type 2 diabetes mellitus without complications: Secondary | ICD-10-CM | POA: Insufficient documentation

## 2022-07-19 DIAGNOSIS — Z992 Dependence on renal dialysis: Secondary | ICD-10-CM | POA: Insufficient documentation

## 2022-07-19 DIAGNOSIS — I5022 Chronic systolic (congestive) heart failure: Secondary | ICD-10-CM | POA: Insufficient documentation

## 2022-07-19 DIAGNOSIS — I120 Hypertensive chronic kidney disease with stage 5 chronic kidney disease or end stage renal disease: Secondary | ICD-10-CM | POA: Diagnosis not present

## 2022-07-19 DIAGNOSIS — R0602 Shortness of breath: Secondary | ICD-10-CM | POA: Diagnosis not present

## 2022-07-19 LAB — CBC
HCT: 41 % (ref 39.0–52.0)
Hemoglobin: 13.2 g/dL (ref 13.0–17.0)
MCH: 29.9 pg (ref 26.0–34.0)
MCHC: 32.2 g/dL (ref 30.0–36.0)
MCV: 92.8 fL (ref 80.0–100.0)
Platelets: 171 10*3/uL (ref 150–400)
RBC: 4.42 MIL/uL (ref 4.22–5.81)
RDW: 14.5 % (ref 11.5–15.5)
WBC: 12.3 10*3/uL — ABNORMAL HIGH (ref 4.0–10.5)
nRBC: 0 % (ref 0.0–0.2)

## 2022-07-19 LAB — BASIC METABOLIC PANEL
Anion gap: 13 (ref 5–15)
BUN: 79 mg/dL — ABNORMAL HIGH (ref 6–20)
CO2: 24 mmol/L (ref 22–32)
Calcium: 8.3 mg/dL — ABNORMAL LOW (ref 8.9–10.3)
Chloride: 99 mmol/L (ref 98–111)
Creatinine, Ser: 12.51 mg/dL — ABNORMAL HIGH (ref 0.61–1.24)
GFR, Estimated: 4 mL/min — ABNORMAL LOW (ref >60)
Glucose, Bld: 262 mg/dL — ABNORMAL HIGH (ref 70–99)
Potassium: 5.8 mmol/L — ABNORMAL HIGH (ref 3.5–5.1)
Sodium: 136 mmol/L (ref 135–145)

## 2022-07-19 LAB — TROPONIN I (HIGH SENSITIVITY): Troponin I (High Sensitivity): 99 ng/L — ABNORMAL HIGH (ref <18)

## 2022-07-19 MED ORDER — IPRATROPIUM-ALBUTEROL 0.5-2.5 (3) MG/3ML IN SOLN
3.0000 mL | Freq: Once | RESPIRATORY_TRACT | Status: AC
  Administered 2022-07-19: 3 mL via RESPIRATORY_TRACT
  Filled 2022-07-19: qty 3

## 2022-07-19 MED ORDER — SODIUM ZIRCONIUM CYCLOSILICATE 5 G PO PACK
10.0000 g | PACK | Freq: Once | ORAL | Status: AC
  Administered 2022-07-19: 10 g via ORAL
  Filled 2022-07-19: qty 2

## 2022-07-19 NOTE — ED Triage Notes (Signed)
Pt BIB [facility] c/o SOB that began tonight. Hx of CHF & COPD

## 2022-07-19 NOTE — Progress Notes (Signed)
Patient placed on 2.5L after treatment.  Current sat at 97%, BS clear.

## 2022-07-19 NOTE — ED Notes (Signed)
Patient verbalizes understanding of discharge instructions. Opportunity for questioning and answers were provided. Armband removed by staff, pt discharged from ED. Wheeled out to lobby  

## 2022-07-19 NOTE — ED Provider Notes (Signed)
[place] EMERGENCY DEPARTMENT AT [facility] Provider Note   CSN: [id number] Arrival date & time: 07/19/22  0045     History  Chief Complaint  Patient presents with   Shortness of Breath    Anthony Bullock is a 62 y.o. male.  The history is provided by the patient.  Patient with extensive history including COPD, diabetes, end-stage renal disease presents with shortness of breath. Patient reports his last dialysis session was on July 6.  He reports since that time he is increased his oral intake and fluids due to feeling dry from the heat.  He reports tonight he started having increasing shortness of breath and chest tightness.  No fevers or vomiting. No syncope.  No chest pain at this time. No LE extremity edema. His next dialysis session is later today No fevers are reported.  No significant cough reported    Past Medical History:  Diagnosis Date   AICD (automatic cardioverter/defibrillator) present    [employer]   Allergic rhinitis    Anemia    Arthritis    Chronic systolic heart failure (HCC)    a. ECHO (12/2012) EF 25-30%, HK entireanteroseptal myocardium //  b.  EF 25%, diffuse HK, grade 1 diastolic dysfunction, MAC, mild LAE, normal RVSF, trivial pericardial effusion   COPD (chronic obstructive pulmonary disease) (HCC)    Diabetes mellitus type II    Diabetic nephropathy (HCC)    Diabetic neuropathy (HCC)    ESRD on hemodialysis (HCC)    started HD June 2017, goes to [facility] HD unit, Dr Fausto Skillern   History of cardiac catheterization    a.Myoview 1/15:  There is significant left ventricular dysfunction. There may be slight scar at the apex. There is no significant ischemia. LV Ejection Fraction: 27%  //  b. RHC/LHC (1/15) with mean RA 6, PA 47/22 mean 33, mean PCWP 20, PVR 2.5 WU, CI 2.5; 80% dLAD stenosis, 70% diffuse large D.     History of kidney stones    Hyperlipidemia    Hypertension    Kidney stones    NICM (nonischemic cardiomyopathy)  (HCC)    Primarily nonischemic.  Echo (12/14) with EF 25-30%.  Echo (3/15) with EF 25%, mild to moderately dilated LV, normal RV size and systolic function.     Osteomyelitis (HCC)    left fifth ray   Pneumonia    Urethral stricture    Wears glasses     Home Medications Prior to Admission medications   Medication Sig Start Date End Date Taking? Authorizing Provider  acetaminophen (TYLENOL) 325 MG tablet Take 1-2 tablets (325-650 mg total) by mouth every 4 (four) hours as needed for mild pain. 11/29/19   Love, Evlyn Kanner, PA-C  aspirin EC 81 MG tablet Take 81 mg by mouth daily.     [provider]  atorvastatin (LIPITOR) 80 MG tablet TAKE 1 TABLET BY MOUTH AT BEDTIME 06/24/22   Burchette, Elberta Fortis, MD  azelastine (ASTELIN) 0.1 % nasal spray Place 2 sprays into both nostrils 3 (three) times daily as needed for rhinitis. Use in each nostril as directed 03/10/14   Burchette, Elberta Fortis, MD  benzonatate (TESSALON PERLES) 100 MG capsule 1-2 capsules up to twice daily as needed for cough 02/25/21   Terressa Koyanagi, DO  CALCITRIOL PO Take by mouth 3 (three) times a week. ONLY PROVIDED AT DIALYSIS    [provider]  carvedilol (COREG) 12.5 MG tablet Take 1 tablet by mouth twice daily  06/09/22   Burchette, Elberta Fortis, MD  cinacalcet (SENSIPAR) 30 MG tablet Take 1 tablet (30 mg total) by mouth Every Tuesday,Thursday,and Saturday with dialysis. 11/30/19   Love, Evlyn Kanner, PA-C  Continuous Glucose Sensor (FREESTYLE LIBRE 14 DAY SENSOR) MISC USE AS DIRECTED EVERY 14 DAYS 05/11/22   Burchette, Elberta Fortis, MD  fenofibrate 160 MG tablet TAKE 1 TABLET BY MOUTH ONCE DAILY 06/24/22   Burchette, Elberta Fortis, MD  fexofenadine (ALLEGRA) 180 MG tablet Take 180 mg by mouth daily.    [provider]  furosemide (LASIX) 40 MG tablet Take 3 tablets by mouth once daily 06/13/22   Burchette, Elberta Fortis, MD  gabapentin (NEURONTIN) 300 MG capsule TAKE 2 CAPSULES BY MOUTH TWICE DAILY . 06/24/22   Burchette, Elberta Fortis, MD  glucose  blood test strip Check 1 time daily. E11.9 One Touch Ultra Blue Test Strips 03/10/14   Burchette, Elberta Fortis, MD  HUMALOG KWIKPEN 200 UNIT/ML KwikPen INJECT A MAXIMUM OF 28 UNITS SUBCUTANEOUSLY TWICE DAILY WITH LUNCH AND SUPPER PER SLIDING SCALE. APPOINTMENT REQUIRED FOR FUTURE REFILLS 06/13/22   Burchette, Elberta Fortis, MD  Insulin Pen Needle (BD PEN NEEDLE NANO U/F) 32G X 4 MM MISC USE 1 PEN NEEDLE SUBCUTANEOUSLY WITH INSULIN 4 TIMES DAILY 12/04/19   Burchette, Elberta Fortis, MD  lanthanum (FOSRENOL) 500 MG chewable tablet Chew 500-1,000 mg by mouth See admin instructions. Take 1000 mg with meals three time a day and 500 mg with snacks    [provider]  LANTUS SOLOSTAR 100 UNIT/ML Solostar Pen INJECT 12 UNITS SUBCUTANEOUSLY AT BEDTIME 07/15/22   Burchette, Elberta Fortis, MD  midodrine (PROAMATINE) 10 MG tablet Take 10 mg by mouth 3 (three) times a week. 08/04/20   [provider]  multivitamin (RENA-VIT) TABS tablet Take 1 tablet by mouth once daily 07/06/21   Burchette, Elberta Fortis, MD  Olopatadine HCl 0.2 % SOLN Place 1 drop into both eyes daily as needed (for allergies).     [provider]  ondansetron (ZOFRAN ODT) 8 MG disintegrating tablet Take 1 tablet (8 mg total) by mouth every 8 (eight) hours as needed for nausea or vomiting. 02/17/21   Burchette, Elberta Fortis, MD  VENTOLIN HFA 108 (90 Base) MCG/ACT inhaler INHALE 2 PUFFS INTO LUNGS EVERY 4 HOURS AS NEEDED FOR WHEEZING OR SHORTNESS OF BREATH 06/09/22   Burchette, Elberta Fortis, MD      Allergies    Epoetin alfa, Ferumoxytol, and Morphine sulfate    Review of Systems   Review of Systems  Constitutional:  Negative for fever.  Respiratory:  Positive for shortness of breath. Negative for cough.   Cardiovascular:  Positive for chest pain. Negative for leg swelling.  Neurological:  Negative for syncope.    Physical Exam Updated Vital Signs BP (!) 127/45   Pulse (!) 101   Temp 98.3 F (36.8 C)   Resp 20   Ht 1.803 m (5\' 11" )   Wt 97.1 kg   SpO2  97%   BMI 29.86 kg/m  Physical Exam CONSTITUTIONAL: Chronically ill-appearing, no acute distress HEAD: Normocephalic/atraumatic EYES: EOMI/PERRL ENMT: Mucous membranes moist NECK: supple no meningeal signs SPINE/BACK:entire spine nontender CV: S1/S2 noted, no loud harsh murmurs LUNGS: Wheezing and coarse breath sounds bilaterally ABDOMEN: soft, nontender NEURO: Pt is awake/alert/appropriate, moves all extremitiesx4.  No facial droop.   EXTREMITIES: Bilateral BKA, prosthesis in place SKIN: warm, color normal, catheter in right chest PSYCH: no abnormalities of mood noted, alert and oriented to situation  ED Results /  Procedures / Treatments   Labs (all labs ordered are listed, but only abnormal results are displayed) Labs Reviewed  BASIC METABOLIC PANEL - Abnormal; Notable for the following components:      Result Value   Potassium 5.8 (*)    Glucose, Bld 262 (*)    BUN 79 (*)    Creatinine, Ser 12.51 (*)    Calcium 8.3 (*)    GFR, Estimated 4 (*)    All other components within normal limits  CBC - Abnormal; Notable for the following components:   WBC 12.3 (*)    All other components within normal limits  TROPONIN I (HIGH SENSITIVITY) - Abnormal; Notable for the following components:   Troponin I (High Sensitivity) 99 (*)    All other components within normal limits    EKG EKG Interpretation Date/Time:  Tuesday July 19 2022 01:08:09 EDT Ventricular Rate:  97 PR Interval:  173 QRS Duration:  122 QT Interval:  374 QTC Calculation: 476 R Axis:   -37  Text Interpretation: Sinus rhythm Nonspecific IVCD with LAD Confirmed by Zadie Rhine ([id number]) on 07/19/2022 1:13:27 AM  Radiology DG Chest 2 View  Result Date: 07/19/2022 CLINICAL DATA:  COPD, shortness of breath EXAM: CHEST - 2 VIEW COMPARISON:  02/23/2021 FINDINGS: Right dialysis catheter and left side pacer remain in place, unchanged. Mild hyperinflation, stable. Heart and mediastinal contours within normal limits.  Aortic atherosclerosis. No confluent opacities or effusions. No overt edema. No acute bony abnormality. IMPRESSION: Mild hyperinflation.  No active disease. Electronically Signed   By: Charlett Nose M.D.   On: 07/19/2022 01:35    Procedures Procedures    Medications Ordered in ED Medications  ipratropium-albuterol (DUONEB) 0.5-2.5 (3) MG/3ML nebulizer solution 3 mL (3 mLs Nebulization Given 07/19/22 0258)  sodium zirconium cyclosilicate (LOKELMA) packet 10 g (10 g Oral Given 07/19/22 0336)    ED Course/ Medical Decision Making/ A&P Clinical Course as of 07/19/22 0409  Tue Jul 19, 2022  0338 Pt feeling improved Still has wheezing but no distress No signs of significant volume overload He has HD later this morning-  will d/c at 5am to go directly to HD Mild hyperK - will give lokelma (no EKG changes) Pt encouraged to use albuterol at home He refuses prednisone  [DW]  1914 Low suspicion for ACS/PE at this time [DW]  0401 Pt stable.  RA pulse ox in low-mid 90s No distress He would like to be discharged to dialysis [DW]    Clinical Course User Index [DW] Zadie Rhine, MD                             Medical Decision Making Amount and/or Complexity of Data Reviewed Labs: ordered. Radiology: ordered.  Risk Prescription drug management.   This patient presents to the ED for concern of shortness of breath, this involves an extensive number of treatment options, and is a complaint that carries with it a high risk of complications and morbidity.  The differential diagnosis includes but is not limited to Acute coronary syndrome, pneumonia, acute pulmonary edema, pneumothorax, acute anemia, pulmonary embolism    Comorbidities that complicate the patient evaluation: Patient's presentation is complicated by their history of stage renal disease, diabetes  Social Determinants of Health: Patient's  physical inactivity   increases the complexity of managing their  presentation  Additional history obtained: Additional history obtained from spouse Records reviewed Primary Care Documents  Lab Tests: I Ordered, and  personally interpreted labs.  The pertinent results include: Mild hyperkalemia, renal failure, hyperglycemia  Imaging Studies ordered: I ordered imaging studies including X-ray chest   I independently visualized and interpreted imaging which showed no acute findings I agree with the radiologist interpretation  Cardiac Monitoring: The patient was maintained on a cardiac monitor.  I personally viewed and interpreted the cardiac monitor which showed an underlying rhythm of:  sinus rhythm  Medicines ordered and prescription drug management: I ordered medication including nebulized treatments for wheezing Reevaluation of the patient after these medicines showed that the patient    improved  Reevaluation: After the interventions noted above, I reevaluated the patient and found that they have :improved  Complexity of problems addressed: Patient's presentation is most consistent with  acute presentation with potential threat to life or bodily function  Disposition: After consideration of the diagnostic results and the patient's response to treatment,  I feel that the patent would benefit from discharge   .           Final Clinical Impression(s) / ED Diagnoses Final diagnoses:  ESRD (end stage renal disease) (HCC)  Acute bronchospasm    Rx / DC Orders ED Discharge Orders     None         Zadie Rhine, MD 07/19/22 0410

## 2022-07-21 DIAGNOSIS — N2581 Secondary hyperparathyroidism of renal origin: Secondary | ICD-10-CM | POA: Diagnosis not present

## 2022-07-21 DIAGNOSIS — Z992 Dependence on renal dialysis: Secondary | ICD-10-CM | POA: Diagnosis not present

## 2022-07-21 DIAGNOSIS — N250 Renal osteodystrophy: Secondary | ICD-10-CM | POA: Diagnosis not present

## 2022-07-21 DIAGNOSIS — N186 End stage renal disease: Secondary | ICD-10-CM | POA: Diagnosis not present

## 2022-07-21 DIAGNOSIS — D509 Iron deficiency anemia, unspecified: Secondary | ICD-10-CM | POA: Diagnosis not present

## 2022-07-23 DIAGNOSIS — Z992 Dependence on renal dialysis: Secondary | ICD-10-CM | POA: Diagnosis not present

## 2022-07-23 DIAGNOSIS — N250 Renal osteodystrophy: Secondary | ICD-10-CM | POA: Diagnosis not present

## 2022-07-23 DIAGNOSIS — N186 End stage renal disease: Secondary | ICD-10-CM | POA: Diagnosis not present

## 2022-07-23 DIAGNOSIS — D509 Iron deficiency anemia, unspecified: Secondary | ICD-10-CM | POA: Diagnosis not present

## 2022-07-23 DIAGNOSIS — N2581 Secondary hyperparathyroidism of renal origin: Secondary | ICD-10-CM | POA: Diagnosis not present

## 2022-07-25 ENCOUNTER — Telehealth: Payer: Self-pay

## 2022-07-25 ENCOUNTER — Telehealth: Payer: Self-pay | Admitting: Family Medicine

## 2022-07-25 ENCOUNTER — Ambulatory Visit (INDEPENDENT_AMBULATORY_CARE_PROVIDER_SITE_OTHER): Payer: Medicare Other | Admitting: Family Medicine

## 2022-07-25 ENCOUNTER — Encounter: Payer: Self-pay | Admitting: Family Medicine

## 2022-07-25 VITALS — BP 102/60 | HR 82 | Temp 98.1°F | Ht 71.0 in | Wt 217.5 lb

## 2022-07-25 DIAGNOSIS — L723 Sebaceous cyst: Secondary | ICD-10-CM

## 2022-07-25 DIAGNOSIS — J449 Chronic obstructive pulmonary disease, unspecified: Secondary | ICD-10-CM | POA: Diagnosis not present

## 2022-07-25 DIAGNOSIS — E1129 Type 2 diabetes mellitus with other diabetic kidney complication: Secondary | ICD-10-CM | POA: Diagnosis not present

## 2022-07-25 DIAGNOSIS — E1165 Type 2 diabetes mellitus with hyperglycemia: Secondary | ICD-10-CM | POA: Diagnosis not present

## 2022-07-25 DIAGNOSIS — E875 Hyperkalemia: Secondary | ICD-10-CM

## 2022-07-25 DIAGNOSIS — Z794 Long term (current) use of insulin: Secondary | ICD-10-CM | POA: Diagnosis not present

## 2022-07-25 MED ORDER — FREESTYLE LIBRE 3 SENSOR MISC: 11 refills | Status: DC

## 2022-07-25 MED ORDER — VENTOLIN HFA 108 (90 BASE) MCG/ACT IN AERS: 1.0000 | INHALATION_SPRAY | Freq: Four times a day (QID) | RESPIRATORY_TRACT | 1 refills | Status: DC | PRN

## 2022-07-25 NOTE — Progress Notes (Signed)
Established Patient Office Visit  Subjective   Patient ID: Anthony Bullock, male    DOB: [birth year]  Age: 62 y.o. MRN: [id number]  Chief Complaint  Patient presents with   Medical Management of Chronic Issues    HPI   Anthony Bullock had scheduled to come back today for suture removal from cyst excision right scalp but also in the meantime has had ER visit for dyspnea and needs a few other things as below  No problems with healing from right sebaceous cyst excision right scalp.  No pain.  No drainage.  Went to the ER on July 9 with some increased dyspnea.  His last dialysis had been July 6.  He had increased his oral intake and also had been out in the heat some which he thinks may have worsened.  No chest pains.  No lower extremity edema.  Labs are significant for potassium of 5.8.  Chest x-ray showed no pulmonary edema.  He was given some oxygen and felt better afterwards and went straight from there to hemodialysis.  He was given [facility] in the ER.  No significant EKG changes.  Recent ER labs and chest x-ray reviewed  He has longstanding history of diabetes.  Currently on regimen of Humalog at meals and Lantus once daily.  Brings in his current sliding scale and he is active and taking more than what is on his current sliding scale but still has high blood sugars.  Recent A1c 8.7%  Past Medical History:  Diagnosis Date   AICD (automatic cardioverter/defibrillator) present    [employer]   Allergic rhinitis    Anemia    Arthritis    Chronic systolic heart failure (HCC)    a. ECHO (12/2012) EF 25-30%, HK entireanteroseptal myocardium //  b.  EF 25%, diffuse HK, grade 1 diastolic dysfunction, MAC, mild LAE, normal RVSF, trivial pericardial effusion   COPD (chronic obstructive pulmonary disease) (HCC)    Diabetes mellitus type II    Diabetic nephropathy (HCC)    Diabetic neuropathy (HCC)    ESRD on hemodialysis (HCC)    started HD June 2017, goes to [facility] HD unit, Dr Fausto Skillern    History of cardiac catheterization    a.Myoview 1/15:  There is significant left ventricular dysfunction. There may be slight scar at the apex. There is no significant ischemia. LV Ejection Fraction: 27%  //  b. RHC/LHC (1/15) with mean RA 6, PA 47/22 mean 33, mean PCWP 20, PVR 2.5 WU, CI 2.5; 80% dLAD stenosis, 70% diffuse large D.     History of kidney stones    Hyperlipidemia    Hypertension    Kidney stones    NICM (nonischemic cardiomyopathy) (HCC)    Primarily nonischemic.  Echo (12/14) with EF 25-30%.  Echo (3/15) with EF 25%, mild to moderately dilated LV, normal RV size and systolic function.     Osteomyelitis (HCC)    left fifth ray   Pneumonia    Urethral stricture    Wears glasses    Past Surgical History:  Procedure Laterality Date   ABDOMINAL AORTOGRAM W/LOWER EXTREMITY N/A 03/30/2016   Procedure: Abdominal Aortogram w/Lower Extremity;  Surgeon: Chuck Hint, MD;  Location: [facility] INVASIVE CV LAB;  Service: Cardiovascular;  Laterality: N/A;   AMPUTATION Right 04/26/2016   Procedure: Right Below Knee Amputation;  Surgeon: Nadara Mustard, MD;  Location: [facility] OR;  Service: Orthopedics;  Laterality: Right;   AMPUTATION Left 08/21/2019   Procedure: LEFT FOOT 5TH RAY  AMPUTATION;  Surgeon: Nadara Mustard, MD;  Location: [facility] OR;  Service: Orthopedics;  Laterality: Left;   AMPUTATION Left 11/13/2019   Procedure: LEFT BELOW KNEE AMPUTATION;  Surgeon: Nadara Mustard, MD;  Location: [facility] OR;  Service: Orthopedics;  Laterality: Left;   AV FISTULA PLACEMENT Right 09/08/2015   Procedure: INSERTION OF 4-19mm x 45cm  ARTERIOVENOUS (AV) GORE-TEX GRAFT RIGHT UPPER  ARM;  Surgeon: Chuck Hint, MD;  Location: [facility] OR;  Service: Vascular;  Laterality: Right;   AV FISTULA PLACEMENT Left 01/14/2016   Procedure: CREATION OF LEFT UPPER ARM ARTERIOVENOUS FISTULA;  Surgeon: Chuck Hint, MD;  Location: [facility] OR;  Service: Vascular;  Laterality: Left;   BASCILIC VEIN TRANSPOSITION Right 08/22/2014    Procedure: RIGHT UPPER ARM BASCILIC VEIN TRANSPOSITION;  Surgeon: Chuck Hint, MD;  Location: [facility] OR;  Service: Vascular;  Laterality: Right;   BELOW KNEE LEG AMPUTATION Right 04/26/2016   CARDIAC CATHETERIZATION     CARDIAC DEFIBRILLATOR PLACEMENT  06/27/2013   Sub Q       BY DR Graciela Husbands   CATARACT EXTRACTION W/PHACO Right 08/06/2018   Procedure: CATARACT EXTRACTION PHACO AND INTRAOCULAR LENS PLACEMENT (IOC);  Surgeon: Fabio Pierce, MD;  Location: [facility] ORS;  Service: Ophthalmology;  Laterality: Right;  CDE: 4.06   CATARACT EXTRACTION W/PHACO Left 08/20/2018   Procedure: CATARACT EXTRACTION PHACO AND INTRAOCULAR LENS PLACEMENT (IOC);  Surgeon: Fabio Pierce, MD;  Location: [facility] ORS;  Service: Ophthalmology;  Laterality: Left;  CDE: 6.76   COLONOSCOPY WITH PROPOFOL N/A 07/22/2015   Procedure: COLONOSCOPY WITH PROPOFOL;  Surgeon: Sherrilyn Rist, MD;  Location: [facility] ENDOSCOPY;  Service: Gastroenterology;  Laterality: N/A;   FEMORAL-POPLITEAL BYPASS GRAFT Right 03/31/2016   Procedure: BYPASS GRAFT FEMORAL-POPLITEAL ARTERY USING RIGHT GREATER SAPHENOUS NONREVERSED VEIN;  Surgeon: Chuck Hint, MD;  Location: [facility] OR;  Service: Vascular;  Laterality: Right;   HERNIA REPAIR     I & D EXTREMITY Right 03/31/2016   Procedure: IRRIGATION AND DEBRIDEMENT FOOT;  Surgeon: Chuck Hint, MD;  Location: [facility] OR;  Service: Vascular;  Laterality: Right;   IMPLANTABLE CARDIOVERTER DEFIBRILLATOR IMPLANT N/A 06/27/2013   Procedure: SUB Q ICD;  Surgeon: Duke Salvia, MD;  Location: [facility] CATH LAB;  Service: Cardiovascular;  Laterality: N/A;   INTRAOPERATIVE ARTERIOGRAM Right 03/31/2016   Procedure: INTRA OPERATIVE ARTERIOGRAM;  Surgeon: Chuck Hint, MD;  Location: [facility] OR;  Service: Vascular;  Laterality: Right;   IR GENERIC HISTORICAL Right 11/30/2015   IR THROMBECTOMY AV FISTULA W/THROMBOLYSIS/PTA INC/SHUNT/IMG RIGHT 11/30/2015 Irish Lack, MD [facility] RAD   IR GENERIC HISTORICAL   11/30/2015   IR US GUIDE VASC ACCESS RIGHT 11/30/2015 Irish Lack, MD [facility]   IR GENERIC HISTORICAL Right 12/15/2015   IR THROMBECTOMY AV FISTULA W/THROMBOLYSIS/PTA/STENT INC/SHUNT/IMG RT 12/15/2015 Oley Balm, MD [facility]   IR GENERIC HISTORICAL  12/15/2015   IR US GUIDE VASC ACCESS RIGHT 12/15/2015 Oley Balm, MD [facility]   IR GENERIC HISTORICAL  12/28/2015   IR FLUORO GUIDE CV LINE RIGHT 12/28/2015 Jolaine Click, MD [facility]   IR GENERIC HISTORICAL  12/28/2015   IR US GUIDE VASC ACCESS RIGHT 12/28/2015 Jolaine Click, MD [facility]   LEFT A ND RIGHT HEART CATH  01/30/2013   DR Jones Broom   LEFT AND RIGHT HEART CATHETERIZATION WITH CORONARY ANGIOGRAM N/A 01/30/2013   Procedure: LEFT AND RIGHT HEART CATHETERIZATION WITH CORONARY ANGIOGRAM;  Surgeon: Dolores Patty, MD;  Location: [facility] CATH LAB;  Service: Cardiovascular;  Laterality: N/A;   PERIPHERAL VASCULAR CATHETERIZATION Right 01/26/2015   Procedure: A/V Fistulagram;  Surgeon: Chuck Hint, MD;  Location: [facility] INVASIVE CV LAB;  Service: Cardiovascular;  Laterality: Right;   reapea urethral surgery for recurrent obstruction  2011   TOTAL KNEE ARTHROPLASTY Right 2007   VEIN HARVEST Right 03/31/2016   Procedure: RIGHT GREATER SAPHENOUS VEIN HARVEST;  Surgeon: Chuck Hint, MD;  Location: [facility] OR;  Service: Vascular;  Laterality: Right;    reports that he quit smoking about 12 years ago. His smoking use included cigarettes. He started smoking about 44 years ago. He has a 64 pack-year smoking history. He has never used smokeless tobacco. He reports that he does not drink alcohol and does not use drugs. family history includes Alcohol abuse in his father; Bladder Cancer in his mother; Diabetes in his maternal grandmother; Heart Problems in his maternal grandmother; Heart disease in his maternal grandfather; Melanoma in his father; Prostate cancer in his maternal grandfather; Stroke in his maternal  grandmother. Allergies  Allergen Reactions   Epoetin Alfa Other (See Comments)    unknown   Ferumoxytol Other (See Comments)    unknown   Morphine Sulfate Rash and Other (See Comments)    Itches all over, red spots     Review of Systems  Constitutional:  Negative for chills and fever.  Respiratory:  Negative for cough and shortness of breath.   Cardiovascular:  Negative for chest pain.  Gastrointestinal:  Negative for abdominal pain.      Objective:     BP 102/60 (BP Location: Left Arm, Patient Position: Sitting, Cuff Size: Normal)   Pulse 82   Temp 98.1 F (36.7 C) (Oral)   Ht 5\' 11"  (1.803 m)   Wt 217 lb 8 oz (98.7 kg)   SpO2 96%   BMI 30.34 kg/m  BP Readings from Last 3 Encounters:  07/25/22 102/60  07/19/22 (!) 130/52  07/15/22 104/70   Wt Readings from Last 3 Encounters:  07/25/22 217 lb 8 oz (98.7 kg)  07/19/22 214 lb 1.1 oz (97.1 kg)  07/15/22 214 lb (97.1 kg)      Physical Exam Vitals reviewed.  Constitutional:      General: He is not in acute distress.    Appearance: Normal appearance. He is not ill-appearing.  Cardiovascular:     Rate and Rhythm: Normal rate and regular rhythm.  Pulmonary:     Effort: Pulmonary effort is normal.     Breath sounds: Normal breath sounds.  Skin:    Comments: Scalp is healing well.  4 sutures removed.  No signs of secondary infection.  Neurological:     Mental Status: He is alert.      No results found for any visits on 07/25/22.  Last CBC Lab Results  Component Value Date   WBC 12.3 (H) 07/19/2022   HGB 13.2 07/19/2022   HCT 41.0 07/19/2022   MCV 92.8 07/19/2022   MCH 29.9 07/19/2022   RDW 14.5 07/19/2022   PLT 171 07/19/2022   Last metabolic panel Lab Results  Component Value Date   GLUCOSE 262 (H) 07/19/2022   NA 136 07/19/2022   K 5.8 (H) 07/19/2022   CL 99 07/19/2022   CO2 24 07/19/2022   BUN 79 (H) 07/19/2022   CREATININE 12.51 (H) 07/19/2022   GFRNONAA 4 (L) 07/19/2022   CALCIUM 8.3  (L) 07/19/2022   PHOS 3.8 11/28/2019   PROT 7.7 06/24/2022   ALBUMIN 3.8 06/24/2022   BILITOT  0.4 06/24/2022   ALKPHOS 67 06/24/2022   AST 23 06/24/2022   ALT 17 06/24/2022   ANIONGAP 13 07/19/2022   Last lipids Lab Results  Component Value Date   CHOL 132 06/24/2022   HDL 38.50 (L) 06/24/2022   LDLCALC 57 06/24/2022   LDLDIRECT 134.0 04/25/2011   TRIG 181.0 (H) 06/24/2022   CHOLHDL 3 06/24/2022   Last hemoglobin A1c Lab Results  Component Value Date   HGBA1C 8.7 (A) 06/24/2022      The 10-year ASCVD risk score (Arnett DK, et al., 2019) is: 10.5%    Assessment & Plan:   #1 recent sebaceous cyst excision right scalp.  Healing well.  Sutures removed without difficulty.  Follow-up as needed  #2 type 2 diabetes with history of poor control.  Recent A1c 8.7%.  Refilled freestyle libre #3 sensor.  We did revise his Humalog sliding scale and watch out closely for any hypoglycemia.  Set up 66-month follow-up to reassess  #3 history of COPD.  Patient on recent dyspnea but no evidence for volume overload.  Improved following oxygen and inhaler.  He is requesting refill of Ventolin and this was sent  #4 hyperkalemia by recent labs.  Followed closely by nephrology and getting regular hemodialysis.  Caution about over consumption of high potassium foods  Evelena Peat, MD

## 2022-07-25 NOTE — Telephone Encounter (Signed)
Pt is calling and need a rx for rollator to be sent to [facility] fax number [phone] and phone [phone]

## 2022-07-25 NOTE — Telephone Encounter (Signed)
Transition Care Management Unsuccessful Follow-up Telephone Call  Date of discharge and from where:  07/19/2022 [facility]  Attempts:  1st Attempt  Reason for unsuccessful TCM follow-up call:  No answer/busy  [facility] Bullock [facility]  [facility] Population Health Community Resource Care Guide   [email or web address]  ?? [phone]   Website: [email or web address]

## 2022-07-25 NOTE — Patient Instructions (Addendum)
Increase meal insulin as discussed  Let's set up 3 month follow up.

## 2022-07-26 ENCOUNTER — Telehealth: Payer: Self-pay

## 2022-07-26 DIAGNOSIS — D509 Iron deficiency anemia, unspecified: Secondary | ICD-10-CM | POA: Diagnosis not present

## 2022-07-26 DIAGNOSIS — Z992 Dependence on renal dialysis: Secondary | ICD-10-CM | POA: Diagnosis not present

## 2022-07-26 DIAGNOSIS — N2581 Secondary hyperparathyroidism of renal origin: Secondary | ICD-10-CM | POA: Diagnosis not present

## 2022-07-26 DIAGNOSIS — N250 Renal osteodystrophy: Secondary | ICD-10-CM | POA: Diagnosis not present

## 2022-07-26 DIAGNOSIS — N186 End stage renal disease: Secondary | ICD-10-CM | POA: Diagnosis not present

## 2022-07-26 NOTE — Telephone Encounter (Signed)
Order has been faxed

## 2022-07-26 NOTE — Telephone Encounter (Signed)
Transition Care Management Unsuccessful Follow-up Telephone Call  Date of discharge and from where:  07/19/2022 [facility]  Attempts:  2nd Attempt  Reason for unsuccessful TCM follow-up call:  Left voice message  Anthony Bullock [name]  [facility] Population Health Community Resource Care Guide   [name]  ?? [phone]   Website: [email or web address]

## 2022-07-28 DIAGNOSIS — D509 Iron deficiency anemia, unspecified: Secondary | ICD-10-CM | POA: Diagnosis not present

## 2022-07-28 DIAGNOSIS — Z992 Dependence on renal dialysis: Secondary | ICD-10-CM | POA: Diagnosis not present

## 2022-07-28 DIAGNOSIS — N186 End stage renal disease: Secondary | ICD-10-CM | POA: Diagnosis not present

## 2022-07-28 DIAGNOSIS — N2581 Secondary hyperparathyroidism of renal origin: Secondary | ICD-10-CM | POA: Diagnosis not present

## 2022-07-28 DIAGNOSIS — N250 Renal osteodystrophy: Secondary | ICD-10-CM | POA: Diagnosis not present

## 2022-07-28 NOTE — Telephone Encounter (Signed)
Montel Clock with [facility] [employer] is calling and need ICD-10 code and md signature on rollator. Please refax [phone]

## 2022-07-30 DIAGNOSIS — Z992 Dependence on renal dialysis: Secondary | ICD-10-CM | POA: Diagnosis not present

## 2022-07-30 DIAGNOSIS — N2581 Secondary hyperparathyroidism of renal origin: Secondary | ICD-10-CM | POA: Diagnosis not present

## 2022-07-30 DIAGNOSIS — N250 Renal osteodystrophy: Secondary | ICD-10-CM | POA: Diagnosis not present

## 2022-07-30 DIAGNOSIS — N186 End stage renal disease: Secondary | ICD-10-CM | POA: Diagnosis not present

## 2022-07-30 DIAGNOSIS — D509 Iron deficiency anemia, unspecified: Secondary | ICD-10-CM | POA: Diagnosis not present

## 2022-08-02 DIAGNOSIS — Z992 Dependence on renal dialysis: Secondary | ICD-10-CM | POA: Diagnosis not present

## 2022-08-02 DIAGNOSIS — N186 End stage renal disease: Secondary | ICD-10-CM | POA: Diagnosis not present

## 2022-08-02 DIAGNOSIS — N250 Renal osteodystrophy: Secondary | ICD-10-CM | POA: Diagnosis not present

## 2022-08-02 DIAGNOSIS — N2581 Secondary hyperparathyroidism of renal origin: Secondary | ICD-10-CM | POA: Diagnosis not present

## 2022-08-02 DIAGNOSIS — D509 Iron deficiency anemia, unspecified: Secondary | ICD-10-CM | POA: Diagnosis not present

## 2022-08-02 NOTE — Telephone Encounter (Signed)
Order signed by PCP and faxed.

## 2022-08-04 ENCOUNTER — Other Ambulatory Visit: Payer: Self-pay

## 2022-08-04 ENCOUNTER — Ambulatory Visit: Payer: Medicare Other | Admitting: Family Medicine

## 2022-08-04 ENCOUNTER — Emergency Department (HOSPITAL_COMMUNITY): Payer: Medicare Other

## 2022-08-04 ENCOUNTER — Encounter (HOSPITAL_COMMUNITY): Payer: Self-pay | Admitting: Emergency Medicine

## 2022-08-04 ENCOUNTER — Emergency Department (HOSPITAL_COMMUNITY)
Admission: EM | Admit: 2022-08-04 | Discharge: 2022-08-04 | Disposition: A | Discharge status: Home / Self Care | Payer: Medicare Other | Attending: Emergency Medicine | Admitting: Emergency Medicine

## 2022-08-04 DIAGNOSIS — I672 Cerebral atherosclerosis: Secondary | ICD-10-CM | POA: Diagnosis not present

## 2022-08-04 DIAGNOSIS — N2581 Secondary hyperparathyroidism of renal origin: Secondary | ICD-10-CM | POA: Diagnosis not present

## 2022-08-04 DIAGNOSIS — Z7982 Long term (current) use of aspirin: Secondary | ICD-10-CM | POA: Diagnosis not present

## 2022-08-04 DIAGNOSIS — Z794 Long term (current) use of insulin: Secondary | ICD-10-CM | POA: Diagnosis not present

## 2022-08-04 DIAGNOSIS — H532 Diplopia: Secondary | ICD-10-CM | POA: Diagnosis not present

## 2022-08-04 DIAGNOSIS — D509 Iron deficiency anemia, unspecified: Secondary | ICD-10-CM | POA: Diagnosis not present

## 2022-08-04 DIAGNOSIS — R42 Dizziness and giddiness: Secondary | ICD-10-CM | POA: Diagnosis not present

## 2022-08-04 DIAGNOSIS — N186 End stage renal disease: Secondary | ICD-10-CM | POA: Diagnosis not present

## 2022-08-04 DIAGNOSIS — G441 Vascular headache, not elsewhere classified: Secondary | ICD-10-CM | POA: Diagnosis not present

## 2022-08-04 DIAGNOSIS — R9082 White matter disease, unspecified: Secondary | ICD-10-CM | POA: Diagnosis not present

## 2022-08-04 DIAGNOSIS — Z79899 Other long term (current) drug therapy: Secondary | ICD-10-CM | POA: Insufficient documentation

## 2022-08-04 DIAGNOSIS — N250 Renal osteodystrophy: Secondary | ICD-10-CM | POA: Diagnosis not present

## 2022-08-04 DIAGNOSIS — Z992 Dependence on renal dialysis: Secondary | ICD-10-CM | POA: Diagnosis not present

## 2022-08-04 DIAGNOSIS — R519 Headache, unspecified: Secondary | ICD-10-CM | POA: Diagnosis not present

## 2022-08-04 LAB — CBC WITH DIFFERENTIAL/PLATELET
Abs Immature Granulocytes: 0.03 10*3/uL (ref 0.00–0.07)
Basophils Absolute: 0.1 10*3/uL (ref 0.0–0.1)
Basophils Relative: 1 %
Eosinophils Absolute: 0.4 10*3/uL (ref 0.0–0.5)
Eosinophils Relative: 4 %
HCT: 41.6 % (ref 39.0–52.0)
Hemoglobin: 13.5 g/dL (ref 13.0–17.0)
Immature Granulocytes: 0 %
Lymphocytes Relative: 16 %
Lymphs Abs: 1.6 10*3/uL (ref 0.7–4.0)
MCH: 30.1 pg (ref 26.0–34.0)
MCHC: 32.5 g/dL (ref 30.0–36.0)
MCV: 92.7 fL (ref 80.0–100.0)
Monocytes Absolute: 1.1 10*3/uL — ABNORMAL HIGH (ref 0.1–1.0)
Monocytes Relative: 11 %
Neutro Abs: 6.6 10*3/uL (ref 1.7–7.7)
Neutrophils Relative %: 68 %
Platelets: 193 10*3/uL (ref 150–400)
RBC: 4.49 MIL/uL (ref 4.22–5.81)
RDW: 14.7 % (ref 11.5–15.5)
WBC: 9.8 10*3/uL (ref 4.0–10.5)
nRBC: 0 % (ref 0.0–0.2)

## 2022-08-04 LAB — BASIC METABOLIC PANEL
Anion gap: 12 (ref 5–15)
BUN: 44 mg/dL — ABNORMAL HIGH (ref 6–20)
CO2: 27 mmol/L (ref 22–32)
Calcium: 8.2 mg/dL — ABNORMAL LOW (ref 8.9–10.3)
Chloride: 93 mmol/L — ABNORMAL LOW (ref 98–111)
Creatinine, Ser: 8.6 mg/dL — ABNORMAL HIGH (ref 0.61–1.24)
GFR, Estimated: 7 mL/min — ABNORMAL LOW (ref >60)
Glucose, Bld: 220 mg/dL — ABNORMAL HIGH (ref 70–99)
Potassium: 4.1 mmol/L (ref 3.5–5.1)
Sodium: 132 mmol/L — ABNORMAL LOW (ref 135–145)

## 2022-08-04 NOTE — Discharge Instructions (Signed)
Follow-up with Dr. Essie Hart next week.  Call tomorrow to get an appointment

## 2022-08-04 NOTE — Plan of Care (Signed)
  These are curbside recommendations based upon the information readily available in the chart on brief review as well as history and examination information provided to me by requesting provider and do not replace a full detailed consult  62 year old with past medical history significant for hypertension, hyperlipidemia  poorly controlled diabetes complicated by neuropathy, ESRD on dialysis, CAD, COPD, left BKA, heart failure with defibrillator in place   Per Dr. Estell Harpin this patient has had weeks of monocular double vision in both of his eyes without any other neurological symptoms and neurological examination otherwise completely intact  My colleague had recommended MRI for screening but unfortunately due to pacemaker this cannot happen at [facility] and I was curb sided for further recommendations.  Per Dr. Estell Harpin symptoms have not been rapidly progressive, patient is presenting due to nonresolution of symptoms.  He did see an optometrist on Tuesday that reportedly diagnosed him with foreign body in the left eye (which would explain left eye double vision but not right eye double vision)  As described the symptoms would localize to the eye and not to the brain.  I recommend consultation with ophthalmology and consideration of the eyedrops to see if these improve his symptoms as a dry eye is a common cause of monocular diplopia  Additionally given the chronicity of the symptoms inpatient stroke workup would be of limited benefit

## 2022-08-04 NOTE — ED Triage Notes (Signed)
Pt ambulatory to triage without difficulty with reports of left sided facial pain and double vision that started approximately 1 week ago.  Pt states symptoms started after mowing the lawn.  Pt was seen by eye doctor at that time and was told he had a foreign body in his left eye.  Pt states double vision has moved to both eyes and pain remains.

## 2022-08-05 ENCOUNTER — Encounter (HOSPITAL_COMMUNITY): Payer: Self-pay | Admitting: Emergency Medicine

## 2022-08-05 ENCOUNTER — Emergency Department (HOSPITAL_COMMUNITY)
Admission: EM | Admit: 2022-08-05 | Discharge: 2022-08-05 | Disposition: A | Discharge status: Home / Self Care | Payer: Medicare Other | Attending: Emergency Medicine | Admitting: Emergency Medicine

## 2022-08-05 ENCOUNTER — Other Ambulatory Visit: Payer: Self-pay

## 2022-08-05 ENCOUNTER — Emergency Department (HOSPITAL_COMMUNITY): Payer: Medicare Other

## 2022-08-05 DIAGNOSIS — I672 Cerebral atherosclerosis: Secondary | ICD-10-CM | POA: Diagnosis not present

## 2022-08-05 DIAGNOSIS — Z7982 Long term (current) use of aspirin: Secondary | ICD-10-CM | POA: Insufficient documentation

## 2022-08-05 DIAGNOSIS — I6523 Occlusion and stenosis of bilateral carotid arteries: Secondary | ICD-10-CM | POA: Diagnosis not present

## 2022-08-05 DIAGNOSIS — E1122 Type 2 diabetes mellitus with diabetic chronic kidney disease: Secondary | ICD-10-CM | POA: Insufficient documentation

## 2022-08-05 DIAGNOSIS — Z794 Long term (current) use of insulin: Secondary | ICD-10-CM | POA: Diagnosis not present

## 2022-08-05 DIAGNOSIS — E133293 Other specified diabetes mellitus with mild nonproliferative diabetic retinopathy without macular edema, bilateral: Secondary | ICD-10-CM | POA: Diagnosis not present

## 2022-08-05 DIAGNOSIS — H532 Diplopia: Secondary | ICD-10-CM | POA: Diagnosis not present

## 2022-08-05 DIAGNOSIS — R519 Headache, unspecified: Secondary | ICD-10-CM | POA: Diagnosis present

## 2022-08-05 DIAGNOSIS — Z79899 Other long term (current) drug therapy: Secondary | ICD-10-CM | POA: Diagnosis not present

## 2022-08-05 DIAGNOSIS — Z992 Dependence on renal dialysis: Secondary | ICD-10-CM | POA: Diagnosis not present

## 2022-08-05 DIAGNOSIS — H4901 Third [oculomotor] nerve palsy, right eye: Secondary | ICD-10-CM | POA: Diagnosis not present

## 2022-08-05 DIAGNOSIS — N186 End stage renal disease: Secondary | ICD-10-CM | POA: Diagnosis not present

## 2022-08-05 DIAGNOSIS — I120 Hypertensive chronic kidney disease with stage 5 chronic kidney disease or end stage renal disease: Secondary | ICD-10-CM | POA: Diagnosis not present

## 2022-08-05 DIAGNOSIS — I6502 Occlusion and stenosis of left vertebral artery: Secondary | ICD-10-CM | POA: Diagnosis not present

## 2022-08-05 LAB — I-STAT CHEM 8, ED
BUN: 53 mg/dL — ABNORMAL HIGH (ref 6–20)
Calcium, Ion: 0.91 mmol/L — ABNORMAL LOW (ref 1.15–1.40)
Chloride: 102 mmol/L (ref 98–111)
Creatinine, Ser: 11.3 mg/dL — ABNORMAL HIGH (ref 0.61–1.24)
Glucose, Bld: 193 mg/dL — ABNORMAL HIGH (ref 70–99)
HCT: 40 % (ref 39.0–52.0)
Hemoglobin: 13.6 g/dL (ref 13.0–17.0)
Potassium: 4.9 mmol/L (ref 3.5–5.1)
Sodium: 137 mmol/L (ref 135–145)
TCO2: 26 mmol/L (ref 22–32)

## 2022-08-05 LAB — COMPREHENSIVE METABOLIC PANEL
ALT: 20 U/L (ref 0–44)
AST: 24 U/L (ref 15–41)
Albumin: 3.2 g/dL — ABNORMAL LOW (ref 3.5–5.0)
Alkaline Phosphatase: 80 U/L (ref 38–126)
Anion gap: 12 (ref 5–15)
BUN: 57 mg/dL — ABNORMAL HIGH (ref 6–20)
CO2: 28 mmol/L (ref 22–32)
Calcium: 8.2 mg/dL — ABNORMAL LOW (ref 8.9–10.3)
Chloride: 96 mmol/L — ABNORMAL LOW (ref 98–111)
Creatinine, Ser: 10.46 mg/dL — ABNORMAL HIGH (ref 0.61–1.24)
GFR, Estimated: 5 mL/min — ABNORMAL LOW (ref >60)
Glucose, Bld: 216 mg/dL — ABNORMAL HIGH (ref 70–99)
Potassium: 4.5 mmol/L (ref 3.5–5.1)
Sodium: 136 mmol/L (ref 135–145)
Total Bilirubin: 0.3 mg/dL (ref 0.3–1.2)
Total Protein: 6.8 g/dL (ref 6.5–8.1)

## 2022-08-05 LAB — CBC
HCT: 38.1 % — ABNORMAL LOW (ref 39.0–52.0)
Hemoglobin: 12.2 g/dL — ABNORMAL LOW (ref 13.0–17.0)
MCH: 29.3 pg (ref 26.0–34.0)
MCHC: 32 g/dL (ref 30.0–36.0)
MCV: 91.4 fL (ref 80.0–100.0)
Platelets: 158 10*3/uL (ref 150–400)
RBC: 4.17 MIL/uL — ABNORMAL LOW (ref 4.22–5.81)
RDW: 14.6 % (ref 11.5–15.5)
WBC: 8.1 10*3/uL (ref 4.0–10.5)
nRBC: 0 % (ref 0.0–0.2)

## 2022-08-05 LAB — PROTIME-INR
INR: 1.2 (ref 0.8–1.2)
Prothrombin Time: 15.6 seconds — ABNORMAL HIGH (ref 11.4–15.2)

## 2022-08-05 LAB — DIFFERENTIAL
Abs Immature Granulocytes: 0.02 10*3/uL (ref 0.00–0.07)
Basophils Absolute: 0.1 10*3/uL (ref 0.0–0.1)
Basophils Relative: 1 %
Eosinophils Absolute: 0.3 10*3/uL (ref 0.0–0.5)
Eosinophils Relative: 4 %
Immature Granulocytes: 0 %
Lymphocytes Relative: 19 %
Lymphs Abs: 1.5 10*3/uL (ref 0.7–4.0)
Monocytes Absolute: 0.7 10*3/uL (ref 0.1–1.0)
Monocytes Relative: 9 %
Neutro Abs: 5.6 10*3/uL (ref 1.7–7.7)
Neutrophils Relative %: 67 %

## 2022-08-05 LAB — APTT: aPTT: 29 seconds (ref 24–36)

## 2022-08-05 LAB — ETHANOL: Alcohol, Ethyl (B): <10 mg/dL (ref <10)

## 2022-08-05 MED ORDER — IOHEXOL 350 MG/ML SOLN
50.0000 mL | Freq: Once | INTRAVENOUS | Status: AC | PRN
  Administered 2022-08-05: 50 mL via INTRAVENOUS

## 2022-08-05 NOTE — ED Provider Triage Note (Signed)
Emergency Medicine Provider Triage Evaluation Note  Anthony Bullock , a 62 y.o. male  was evaluated in triage.  Pt complains of double vision.  Patient was seen recently and discharged to follow-up with ophthalmology.  He was seen by an ophthalmologist today had a retinal specialist.  He was sent back in for further evaluation.  He has an AICD in place and cannot get an MRI.  He is having diplopia and 3rd cranial nerve palsy.  He was recommended to have a CT angiogram..  Review of Systems  Positive: Vision abnormality Negative:   Physical Exam  BP 96/65   Pulse 80   Temp 98.3 F (36.8 C) (Oral)   Resp 18   SpO2 94%  Gen:   Awake, no distress   Resp:  Normal effort  MSK:   Moves extremities without difficulty  Other:  Ptosis, 3rd cranial nerve palsy on the right  Medical Decision Making  Medically screening exam initiated at 6:22 PM.  Appropriate orders placed.  RABIH PASQUAL was informed that the remainder of the evaluation will be completed by another provider, this initial triage assessment does not replace that evaluation, and the importance of remaining in the ED until their evaluation is complete.     Arthor Captain, PA-C 08/05/22 1824

## 2022-08-05 NOTE — ED Provider Notes (Signed)
[place] EMERGENCY DEPARTMENT AT [facility] Provider Note   CSN: [id number] Arrival date & time: 08/04/22  1619     History  Chief Complaint  Patient presents with   Diplopia        Headache    Anthony Bullock is a 62 y.o. male.  Patient complains of double vision.  This has been going on for a week.  He saw an optometrist a couple days ago and they were told that he had a corneal abrasion in the thigh which they treated.  Patient still complains of double vision and a minimal headache to right side of head  The history is provided by the patient and medical records. No language interpreter was used.  Headache Pain location:  R parietal Quality: Mild. Radiates to:  Does not radiate Severity currently:  2/10 Severity at highest:  3/10 Onset quality:  Gradual Timing:  Intermittent Progression:  Waxing and waning Chronicity:  New Similar to prior headaches: no   Context: not activity   Relieved by:  Nothing Associated symptoms: no abdominal pain, no back pain, no congestion, no cough, no diarrhea, no fatigue, no seizures and no sinus pressure        Home Medications Prior to Admission medications   Medication Sig Start Date End Date Taking? Authorizing Provider  acetaminophen (TYLENOL) 325 MG tablet Take 1-2 tablets (325-650 mg total) by mouth every 4 (four) hours as needed for mild pain. 11/29/19   Love, Evlyn Kanner, PA-C  aspirin EC 81 MG tablet Take 81 mg by mouth daily.     [provider]  atorvastatin (LIPITOR) 80 MG tablet TAKE 1 TABLET BY MOUTH AT BEDTIME 06/24/22   Burchette, Elberta Fortis, MD  azelastine (ASTELIN) 0.1 % nasal spray Place 2 sprays into both nostrils 3 (three) times daily as needed for rhinitis. Use in each nostril as directed 03/10/14   Burchette, Elberta Fortis, MD  benzonatate (TESSALON PERLES) 100 MG capsule 1-2 capsules up to twice daily as needed for cough 02/25/21   Terressa Koyanagi, DO  CALCITRIOL PO Take by mouth 3 (three) times a week.  ONLY PROVIDED AT DIALYSIS    [provider]  carvedilol (COREG) 12.5 MG tablet Take 1 tablet by mouth twice daily 06/09/22   Burchette, Elberta Fortis, MD  cinacalcet (SENSIPAR) 30 MG tablet Take 1 tablet (30 mg total) by mouth Every Tuesday,Thursday,and Saturday with dialysis. 11/30/19   Love, Evlyn Kanner, PA-C  Continuous Glucose Sensor (FREESTYLE LIBRE 14 DAY SENSOR) MISC USE AS DIRECTED EVERY 14 DAYS 05/11/22   Burchette, Elberta Fortis, MD  Continuous Glucose Sensor (FREESTYLE LIBRE 3 SENSOR) MISC Place 1 sensor on the skin every 14 days. Use to check glucose continuously 07/25/22   Burchette, Elberta Fortis, MD  fenofibrate 160 MG tablet TAKE 1 TABLET BY MOUTH ONCE DAILY 06/24/22   Burchette, Elberta Fortis, MD  fexofenadine (ALLEGRA) 180 MG tablet Take 180 mg by mouth daily.    [provider]  furosemide (LASIX) 40 MG tablet Take 3 tablets by mouth once daily 06/13/22   Burchette, Elberta Fortis, MD  gabapentin (NEURONTIN) 300 MG capsule TAKE 2 CAPSULES BY MOUTH TWICE DAILY . 06/24/22   Burchette, Elberta Fortis, MD  glucose blood test strip Check 1 time daily. E11.9 One Touch Ultra Blue Test Strips 03/10/14   Burchette, Elberta Fortis, MD  HUMALOG KWIKPEN 200 UNIT/ML KwikPen INJECT A MAXIMUM OF 28 UNITS SUBCUTANEOUSLY TWICE DAILY WITH LUNCH AND SUPPER PER SLIDING SCALE. APPOINTMENT  REQUIRED FOR FUTURE REFILLS 06/13/22   Burchette, Elberta Fortis, MD  Insulin Pen Needle (BD PEN NEEDLE NANO U/F) 32G X 4 MM MISC USE 1 PEN NEEDLE SUBCUTANEOUSLY WITH INSULIN 4 TIMES DAILY 12/04/19   Burchette, Elberta Fortis, MD  lanthanum (FOSRENOL) 500 MG chewable tablet Chew 500-1,000 mg by mouth See admin instructions. Take 1000 mg with meals three time a day and 500 mg with snacks    [provider]  LANTUS SOLOSTAR 100 UNIT/ML Solostar Pen INJECT 12 UNITS SUBCUTANEOUSLY AT BEDTIME 07/15/22   Burchette, Elberta Fortis, MD  midodrine (PROAMATINE) 10 MG tablet Take 10 mg by mouth 3 (three) times a week. 08/04/20   [provider]  multivitamin (RENA-VIT) TABS  tablet Take 1 tablet by mouth once daily 07/06/21   Burchette, Elberta Fortis, MD  Olopatadine HCl 0.2 % SOLN Place 1 drop into both eyes daily as needed (for allergies).     [provider]  ondansetron (ZOFRAN ODT) 8 MG disintegrating tablet Take 1 tablet (8 mg total) by mouth every 8 (eight) hours as needed for nausea or vomiting. 02/17/21   Burchette, Elberta Fortis, MD  VENTOLIN HFA 108 (90 Base) MCG/ACT inhaler Inhale 1-2 puffs into the lungs every 6 (six) hours as needed for wheezing or shortness of breath. 07/25/22   Burchette, Elberta Fortis, MD      Allergies    Epoetin alfa, Ferumoxytol, and Morphine sulfate    Review of Systems   Review of Systems  Constitutional:  Negative for appetite change and fatigue.  HENT:  Negative for congestion, ear discharge and sinus pressure.   Eyes:  Negative for discharge.  Respiratory:  Negative for cough.   Cardiovascular:  Negative for chest pain.  Gastrointestinal:  Negative for abdominal pain and diarrhea.  Genitourinary:  Negative for frequency and hematuria.  Musculoskeletal:  Negative for back pain.  Skin:  Negative for rash.  Neurological:  Positive for headaches. Negative for seizures.       Double vision  Psychiatric/Behavioral:  Negative for hallucinations.     Physical Exam Updated Vital Signs BP (!) 176/74 (BP Location: Left Arm)   Pulse 76   Temp 98.3 F (36.8 C) (Oral)   Resp 16   Ht 5\' 11"  (1.803 m)   Wt 97.1 kg   SpO2 94%   BMI 29.85 kg/m  Physical Exam Vitals and nursing note reviewed.  Constitutional:      Appearance: He is well-developed.  HENT:     Head: Normocephalic.     Nose: Nose normal.  Eyes:     General: No scleral icterus.    Extraocular Movements: Extraocular movements intact.     Conjunctiva/sclera: Conjunctivae normal.     Pupils: Pupils are equal, round, and reactive to light.     Comments: Visual acuity normal  Neck:     Thyroid: No thyromegaly.  Cardiovascular:     Rate and Rhythm: Normal rate and  regular rhythm.     Heart sounds: No murmur heard.    No friction rub. No gallop.  Pulmonary:     Breath sounds: No stridor. No wheezing or rales.  Chest:     Chest wall: No tenderness.  Abdominal:     General: There is no distension.     Tenderness: There is no abdominal tenderness. There is no rebound.  Musculoskeletal:        General: Normal range of motion.     Cervical back: Neck supple.  Lymphadenopathy:  Cervical: No cervical adenopathy.  Skin:    Findings: No erythema or rash.  Neurological:     Mental Status: He is oriented to person, place, and time.     Motor: No abnormal muscle tone.     Coordination: Coordination normal.  Psychiatric:        Behavior: Behavior normal.     ED Results / Procedures / Treatments   Labs (all labs ordered are listed, but only abnormal results are displayed) Labs Reviewed  CBC WITH DIFFERENTIAL/PLATELET - Abnormal; Notable for the following components:      Result Value   Monocytes Absolute 1.1 (*)    All other components within normal limits  BASIC METABOLIC PANEL - Abnormal; Notable for the following components:   Sodium 132 (*)    Chloride 93 (*)    Glucose, Bld 220 (*)    BUN 44 (*)    Creatinine, Ser 8.60 (*)    Calcium 8.2 (*)    GFR, Estimated 7 (*)    All other components within normal limits    EKG None  Radiology CT Head Wo Contrast  Result Date: 08/04/2022 CLINICAL DATA:  Headache and fever with double vision. EXAM: CT HEAD WITHOUT CONTRAST TECHNIQUE: Contiguous axial images were obtained from the base of the skull through the vertex without intravenous contrast. RADIATION DOSE REDUCTION: This exam was performed according to the departmental dose-optimization program which includes automated exposure control, adjustment of the mA and/or kV according to patient size and/or use of iterative reconstruction technique. COMPARISON:  No prior brain imaging. Report from prior facial CT 12/08/2011 is reviewed but the  films are unavailable. FINDINGS: Brain: There is mild global atrophy and mild-to-moderate small-vessel disease of the cerebral white matter. There is mild atrophic ventriculomegaly without midline shift. There is a partially empty sella. Dystrophic calcifications are scattered along the falx. No asymmetry is seen concerning for a cortical based acute infarct, hemorrhage, mass or mass effect. Basal cisterns are clear. Vascular: The carotid siphons and distal vertebral arteries are heavily calcified. No hyperdense central vessel is seen. Skull: Negative for fractures or focal lesions. Scattered vascular calcifications in the scalp. Sinuses/Orbits: There is mild membrane disease in the ethmoid air cells and maxillary sinuses. No fluid level. Small retention cyst or polyp in the right locule of the sphenoid sinus. Other visualized sinuses are clear. There is trace fluid in the mastoid tips. Rest of the mastoids are clear. There have been prior lens extractions. An ossific body along side the right inferior nasal turbinate in the right nasal passage is again noted measuring 10.4 x 10.5 mm. 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 This was described previously in 2013 as well. Other: None. IMPRESSION: 1. No acute intracranial CT findings. Atrophy and small-vessel disease. 2. Sinus membrane disease. 3. Trace fluid in the mastoid tips. 4. Partially empty sella. 5. 10.4 x 10.5 mm ossific body along side the right inferior nasal turbinate in the right nasal passage. 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 This was described previously in 2013 as well. Electronically Signed   By: Almira Bar M.D.   On: 08/04/2022 20:11    Procedures Procedures    Medications Ordered in ED Medications - No data to display  ED Course/ Medical Decision Making/ A&P  Medical Decision Making Amount and/or  Complexity of Data Reviewed Labs: ordered. Radiology: ordered.   Double vision and CT scan.  Patient is discharged home to follow-up with ophthalmology        Final Clinical Impression(s) / ED Diagnoses Final diagnoses:  Diplopia    Rx / DC Orders ED Discharge Orders     None         Bethann Berkshire, MD 08/05/22 1749

## 2022-08-05 NOTE — Discharge Instructions (Signed)
Get an eye patch to help with the dizziness and double vision.  This should get better on its own but continue on your aspirin and your cholesterol medication.  Somebody from the neurology office should call you for an appointment.

## 2022-08-05 NOTE — ED Provider Notes (Signed)
[name] Provider Note   CSN: [id number] Arrival date & time: 08/05/22  1718     History  Chief Complaint  Patient presents with   Eye Problem    Anthony Bullock is a 62 y.o. male.  62 year old male patient with past medical history of diabetes mellitus, hypertension, end-stage renal disease on hemodialysis, bilateral lower extremity amputation presents to urgent care with complaints of right-sided headache, double vision, dizziness x 1 week.  Patient was seen at [facility] yesterday had a negative plain CT but followed up with ophthalmology today due to the ongoing diplopia.  Ophthalmology reported patient had a 3rd nerve cranial palsy but appeared to have no other significant ocular deficits.  They sent him back to the emergency room to get a CTA to ensure he did not have an aneurysm.  Patient is not a candidate for an MRI because he has an AICD that is not compatible.  He has been compliant with his dialysis on Tuesday Thursday Saturday.  He denies any infectious symptoms at this time.  Patient reports his symptoms have been persistent but are much better if he keeps 1 eye covered.  He is currently on 81 mg of aspirin.  The history is provided by the patient.  Eye Problem      Home Medications Prior to Admission medications   Medication Sig Start Date End Date Taking? Authorizing Provider  acetaminophen (TYLENOL) 325 MG tablet Take 1-2 tablets (325-650 mg total) by mouth every 4 (four) hours as needed for mild pain. 11/29/19   Love, Evlyn Kanner, PA-C  aspirin EC 81 MG tablet Take 81 mg by mouth daily.     [provider]  atorvastatin (LIPITOR) 80 MG tablet TAKE 1 TABLET BY MOUTH AT BEDTIME 06/24/22   Burchette, Elberta Fortis, MD  azelastine (ASTELIN) 0.1 % nasal spray Place 2 sprays into both nostrils 3 (three) times daily as needed for rhinitis. Use in each nostril as directed 03/10/14   Burchette, Elberta Fortis, MD  benzonatate  (TESSALON PERLES) 100 MG capsule 1-2 capsules up to twice daily as needed for cough 02/25/21   Terressa Koyanagi, DO  CALCITRIOL PO Take by mouth 3 (three) times a week. ONLY PROVIDED AT DIALYSIS    [provider]  carvedilol (COREG) 12.5 MG tablet Take 1 tablet by mouth twice daily 06/09/22   Burchette, Elberta Fortis, MD  cinacalcet (SENSIPAR) 30 MG tablet Take 1 tablet (30 mg total) by mouth Every Tuesday,Thursday,and Saturday with dialysis. 11/30/19   Love, Evlyn Kanner, PA-C  Continuous Glucose Sensor (FREESTYLE LIBRE 14 DAY SENSOR) MISC USE AS DIRECTED EVERY 14 DAYS 05/11/22   Burchette, Elberta Fortis, MD  Continuous Glucose Sensor (FREESTYLE LIBRE 3 SENSOR) MISC Place 1 sensor on the skin every 14 days. Use to check glucose continuously 07/25/22   Burchette, Elberta Fortis, MD  fenofibrate 160 MG tablet TAKE 1 TABLET BY MOUTH ONCE DAILY 06/24/22   Burchette, Elberta Fortis, MD  fexofenadine (ALLEGRA) 180 MG tablet Take 180 mg by mouth daily.    [provider]  furosemide (LASIX) 40 MG tablet Take 3 tablets by mouth once daily 06/13/22   Burchette, Elberta Fortis, MD  gabapentin (NEURONTIN) 300 MG capsule TAKE 2 CAPSULES BY MOUTH TWICE DAILY . 06/24/22   Burchette, Elberta Fortis, MD  glucose blood test strip Check 1 time daily. E11.9 One Touch Ultra Blue Test Strips 03/10/14   Burchette, Elberta Fortis, MD  [facility]olande J[facility]  200 UNIT/ML KwikPen INJECT A MAXIMUM OF 28 UNITS SUBCUTANEOUSLY TWICE DAILY WITH LUNCH AND SUPPER PER SLIDING SCALE. APPOINTMENT REQUIRED FOR FUTURE REFILLS 06/13/22   Burchette, Elberta Fortis, MD  Insulin Pen Needle (BD PEN NEEDLE NANO U/F) 32G X 4 MM MISC USE 1 PEN NEEDLE SUBCUTANEOUSLY WITH INSULIN 4 TIMES DAILY 12/04/19   Burchette, Elberta Fortis, MD  lanthanum (FOSRENOL) 500 MG chewable tablet Chew 500-1,000 mg by mouth See admin instructions. Take 1000 mg with meals three time a day and 500 mg with snacks    [provider]  LANTUS SOLOSTAR 100 UNIT/ML Solostar Pen INJECT 12 UNITS SUBCUTANEOUSLY AT BEDTIME 07/15/22    Burchette, Elberta Fortis, MD  midodrine (PROAMATINE) 10 MG tablet Take 10 mg by mouth 3 (three) times a week. 08/04/20   [provider]  multivitamin (RENA-VIT) TABS tablet Take 1 tablet by mouth once daily 07/06/21   Burchette, Elberta Fortis, MD  Olopatadine HCl 0.2 % SOLN Place 1 drop into both eyes daily as needed (for allergies).     [provider]  ondansetron (ZOFRAN ODT) 8 MG disintegrating tablet Take 1 tablet (8 mg total) by mouth every 8 (eight) hours as needed for nausea or vomiting. 02/17/21   Burchette, Elberta Fortis, MD  VENTOLIN HFA 108 (90 Base) MCG/ACT inhaler Inhale 1-2 puffs into the lungs every 6 (six) hours as needed for wheezing or shortness of breath. 07/25/22   Burchette, Elberta Fortis, MD      Allergies    Epoetin alfa, Ferumoxytol, and Morphine sulfate    Review of Systems   Review of Systems  Physical Exam Updated Vital Signs BP (!) 174/70   Pulse 73   Temp 97.8 F (36.6 C) (Oral)   Resp 18   Ht 5\' 11"  (1.803 m)   Wt 97 kg   SpO2 95%   BMI 29.83 kg/m  Physical Exam Vitals and nursing note reviewed.  Constitutional:      General: He is not in acute distress.    Appearance: He is well-developed.  HENT:     Head: Normocephalic and atraumatic.  Eyes:     Conjunctiva/sclera: Conjunctivae normal.     Comments: Pupils bilaterally are dilated to 5 mm and not reactive  Cardiovascular:     Rate and Rhythm: Normal rate and regular rhythm.     Heart sounds: No murmur heard. Pulmonary:     Effort: Pulmonary effort is normal. No respiratory distress.     Breath sounds: Normal breath sounds. No wheezing or rales.  Abdominal:     General: There is no distension.     Palpations: Abdomen is soft.     Tenderness: There is no abdominal tenderness. There is no guarding or rebound.  Musculoskeletal:        General: No tenderness. Normal range of motion.     Cervical back: Normal range of motion and neck supple.     Comments: Bilateral amputee  Skin:    General: Skin is  warm and dry.     Findings: No erythema or rash.  Neurological:     Mental Status: He is alert and oriented to person, place, and time.     Cranial Nerves: Facial asymmetry present. No dysarthria.     Sensory: Sensation is intact.     Motor: Motor function is intact.     Coordination: Coordination is intact.     Comments: Right eye looks down and out but can move in all directions but some mild difficulty  looking medially.  Left eye with full range of motion.  Patient does have droop of the right eyelid but normal eyebrow raise, normal smile and able to blow out cheeks without evidence of facial drooping or paralysis.  Psychiatric:        Behavior: Behavior normal.     ED Results / Procedures / Treatments   Labs (all labs ordered are listed, but only abnormal results are displayed) Labs Reviewed  PROTIME-INR - Abnormal; Notable for the following components:      Result Value   Prothrombin Time 15.6 (*)    All other components within normal limits  CBC - Abnormal; Notable for the following components:   RBC 4.17 (*)    Hemoglobin 12.2 (*)    HCT 38.1 (*)    All other components within normal limits  COMPREHENSIVE METABOLIC PANEL - Abnormal; Notable for the following components:   Chloride 96 (*)    Glucose, Bld 216 (*)    BUN 57 (*)    Creatinine, Ser 10.46 (*)    Calcium 8.2 (*)    Albumin 3.2 (*)    GFR, Estimated 5 (*)    All other components within normal limits  I-STAT CHEM 8, ED - Abnormal; Notable for the following components:   BUN 53 (*)    Creatinine, Ser 11.30 (*)    Glucose, Bld 193 (*)    Calcium, Ion 0.91 (*)    All other components within normal limits  ETHANOL  APTT  DIFFERENTIAL  RAPID URINE DRUG SCREEN, HOSP PERFORMED    EKG None  Radiology CT ANGIO HEAD NECK W WO CM  Result Date: 08/05/2022 CLINICAL DATA:  Diplopia EXAM: CT ANGIOGRAPHY HEAD AND NECK WITH AND WITHOUT CONTRAST TECHNIQUE: Multidetector CT imaging of the head and neck was performed  using the standard protocol during bolus administration of intravenous contrast. Multiplanar CT image reconstructions and MIPs were obtained to evaluate the vascular anatomy. Carotid stenosis measurements (when applicable) are obtained utilizing NASCET criteria, using the distal internal carotid diameter as the denominator. RADIATION DOSE REDUCTION: This exam was performed according to the departmental dose-optimization program which includes automated exposure control, adjustment of the mA and/or kV according to patient size and/or use of iterative reconstruction technique. CONTRAST:  50mL OMNIPAQUE IOHEXOL 350 MG/ML SOLN COMPARISON:  None Available. FINDINGS: CT HEAD FINDINGS Brain: There is no mass, hemorrhage or extra-axial collection. The size and configuration of the ventricles and extra-axial CSF spaces are normal. There is no acute or chronic infarction. There is hypoattenuation of the periventricular white matter, most commonly indicating chronic ischemic microangiopathy. Skull: The visualized skull base, calvarium and extracranial soft tissues are normal. Sinuses/Orbits: No fluid levels or advanced mucosal thickening of the visualized paranasal sinuses. No mastoid or middle ear effusion. The orbits are normal. CTA NECK FINDINGS SKELETON: There is no bony spinal canal stenosis. No lytic or blastic lesion. OTHER NECK: Normal pharynx, larynx and major salivary glands. No cervical lymphadenopathy. Unremarkable thyroid gland. UPPER CHEST: No pneumothorax or pleural effusion. No nodules or masses. AORTIC ARCH: There is calcific atherosclerosis of the aortic arch. Conventional 3 vessel aortic branching pattern. RIGHT CAROTID SYSTEM: No dissection, occlusion or aneurysm. Mild atherosclerotic calcification at the carotid bifurcation without hemodynamically significant stenosis. LEFT CAROTID SYSTEM: There is moderate mixed density atherosclerosis within the distal common carotid artery and at the carotid  bifurcation. No hemodynamically significant stenosis of the internal carotid artery, but there is narrowing of the common carotid artery to a diameter of  approximately 2 mm. VERTEBRAL ARTERIES: Right dominant configuration.There narrowing of the left vertebral artery origin due to calcific atherosclerosis. Otherwise, there is no dissection, occlusion or flow-limiting stenosis to the skull base (V1-V3 segments). CTA HEAD FINDINGS POSTERIOR CIRCULATION: --Vertebral arteries: Calcific atherosclerosis without high-grade stenosis. --Inferior cerebellar arteries: Normal. --Basilar artery: Normal. --Superior cerebellar arteries: Normal. --Posterior cerebral arteries (PCA): Normal. ANTERIOR CIRCULATION: --Intracranial internal carotid arteries: Calcific atherosclerosis with severe stenosis of both cavernous segments. --Anterior cerebral arteries (ACA): Normal. Both A1 segments are present. Patent anterior communicating artery (a-comm). --Middle cerebral arteries (MCA): Normal. VENOUS SINUSES: As permitted by contrast timing, patent. ANATOMIC VARIANTS: Both P comms are patent Review of the MIP images confirms the above findings. IMPRESSION: 1. No emergent large vessel occlusion. 2. Severe stenosis of both cavernous internal carotid arteries secondary to calcific atherosclerosis. 3. Moderate mixed density atherosclerosis within the distal left common carotid artery and at the carotid bifurcation without hemodynamically significant stenosis of the internal carotid arteries. 4. Moderate narrowing of the left vertebral artery origin due to calcific atherosclerosis. Aortic Atherosclerosis (ICD10-I70.0). Electronically Signed   By: Deatra Robinson M.D.   On: 08/05/2022 19:21   CT Head Wo Contrast  Result Date: 08/04/2022 CLINICAL DATA:  Headache and fever with double vision. EXAM: CT HEAD WITHOUT CONTRAST TECHNIQUE: Contiguous axial images were obtained from the base of the skull through the vertex without intravenous contrast.  RADIATION DOSE REDUCTION: This exam was performed according to the departmental dose-optimization program which includes automated exposure control, adjustment of the mA and/or kV according to patient size and/or use of iterative reconstruction technique. COMPARISON:  No prior brain imaging. Report from prior facial CT 12/08/2011 is reviewed but the films are unavailable. FINDINGS: Brain: There is mild global atrophy and mild-to-moderate small-vessel disease of the cerebral white matter. There is mild atrophic ventriculomegaly without midline shift. There is a partially empty sella. Dystrophic calcifications are scattered along the falx. No asymmetry is seen concerning for a cortical based acute infarct, hemorrhage, mass or mass effect. Basal cisterns are clear. Vascular: The carotid siphons and distal vertebral arteries are heavily calcified. No hyperdense central vessel is seen. Skull: Negative for fractures or focal lesions. Scattered vascular calcifications in the scalp. Sinuses/Orbits: There is mild membrane disease in the ethmoid air cells and maxillary sinuses. No fluid level. Small retention cyst or polyp in the right locule of the sphenoid sinus. Other visualized sinuses are clear. There is trace fluid in the mastoid tips. Rest of the mastoids are clear. There have been prior lens extractions. An ossific body along side the right inferior nasal turbinate in the right nasal passage is again noted measuring 10.4 x 10.5 mm. 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 This was described previously in 2013 as well. Other: None. IMPRESSION: 1. No acute intracranial CT findings. Atrophy and small-vessel disease. 2. Sinus membrane disease. 3. Trace fluid in the mastoid tips. 4. Partially empty sella. 5. 10.4 x 10.5 mm ossific body along side the right inferior nasal turbinate in the right nasal passage. Morphologically this appears to be a portion of an ectopic tooth  and is not fully imaged. This was described previously in 2013 as well. Electronically Signed   By: Almira Bar M.D.   On: 08/04/2022 20:11    Procedures Procedures    Medications Ordered in ED Medications  iohexol (OMNIPAQUE) 350 MG/ML injection 50 mL (50 mLs Intravenous Contrast Given 08/05/22 1858)    ED  Course/ Medical Decision Making/ A&P                             Medical Decision Making Amount and/or Complexity of Data Reviewed External Data Reviewed: notes. Labs: ordered. Decision-making details documented in ED Course. Radiology: ordered and independent interpretation performed. Decision-making details documented in ED Course.   Pt with multiple medical problems and comorbidities and presenting today with a complaint that caries a high risk for morbidity and mortality.  Patient returning today with binocular diplopia that resolves when he covers 1 eye and what appears to be 3rd nerve cranial palsy.  Patient was seen at [facility] yesterday had a negative plain CT but is not a candidate for an MRI due to having an AICD.  He followed up with the ophthalmologist today and was sent to rule out aneurysm.  Patient reports the symptoms have been persistent but are not particularly worse.  He is on 81 mg of aspirin daily.  Patient had relatively normal labs yesterday and states there is nothing different about today.  Blood pressure has been mildly elevated but he reports he is due for dialysis tomorrow.  Ophthalmology did not note any other acute issues with his eye.  It was suspected that the 3rd nerve cranial palsy was most likely related to his diabetes.  I have independently visualized and interpreted pt's images today.  No evidence of aneurysm today.  Radiology does report no large vessel occlusion, severe stenosis of both cavernous internal carotid artery secondary to calcific arthrosclerosis, moderate mixed density atherosclerosis in the distal left common carotid artery  at the bifurcation without significant stenosis of the internal carotid artery and narrowing of the left vertebral artery.  These findings were discussed with Dr. Iver Nestle with neurology.  At this time because patient is symptoms have now been going on a week he would not benefit from Plavix and it would be high risk of due to being a dialysis patient.  He is to continue 81 mg aspirin daily and his statin.  He also needs outpatient neurology follow-up.  She did recommend that he needs a new echo as well and may need cardiac event monitoring.  Patient reports that he follows up regularly with his cardiologist twice a year and sees his regular doctor.  An ambulatory referral for neurology was placed.  Patient is going to pick up an eye patch tomorrow as ours are on backorder and he will call his PCP about getting an echo done.  He desires to leave the emergency room and is planning on going for dialysis tomorrow.  No indication for admission or further testing at this time.  I independently interpreted placed his labs today which are consistent with end-stage renal disease but normal potassium.          Final Clinical Impression(s) / ED Diagnoses Final diagnoses:  Total right oculomotor nerve palsy    Rx / DC Orders ED Discharge Orders          Ordered    Ambulatory referral to Neurology       Comments: An appointment is requested in approximately: 2 weeks  3rd nerve palsy   08/05/22 2229              Gwyneth Sprout, MD 08/05/22 2359

## 2022-08-05 NOTE — Plan of Care (Signed)
These are curbside recommendations based upon the information readily available in the chart on brief review as well as history and examination information provided to me by requesting provider and do not replace a full detailed consult  Today the patient was evaluated by ophthalmology and they were concerned about a 3rd nerve palsy, he was sent to the ED for CTA to rule out aneurysm.  Per Dr. Anitra Lauth examination, patient has no double vision monocularly, but with both eyes uncovered does have double vision.  This has been stable since onset.  Patient is stable and eager for discharge, and reportedly cannot get MRI due to non-compatible AICD   Based on history and examination which Dr. 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, which is secondary to small vessel disease  Recommend optimization of small vessel risk factors -A1c goal less than 7% -LDL goal less than 70 -Inpatient echocardiogram not needed for stroke workup for small vessel risk factors, however since he has not had a echocardiogram since 2021, this can be discussed with his outpatient providers -Given symptoms of already been ongoing for 1 week, limited benefit of addition of Plavix at this time; will continue aspirin monotherapy potential transition to Plavix per outpatient follow-up  CTA head and neck personally reviewed, agree with radiology read: [CT head stable without acute intracranial process] 1. No emergent large vessel occlusion. 2. Severe stenosis of both cavernous internal carotid arteries secondary to calcific atherosclerosis. 3. Moderate mixed density atherosclerosis within the distal left common carotid artery and at the carotid bifurcation without hemodynamically significant stenosis of the internal carotid arteries. 4. Moderate narrowing of the left vertebral artery origin due to calcific  atherosclerosis.  Please note that the examination as described to me today is markedly different from the examination described to me yesterday, although I specifically asked what the patient's symptoms were with each eye closed and was told that with 1 eye closed the patient continued to have double vision and each of the eyes independently.   This patient was discussed with me yesterday as follows:  62 year old with past medical history significant for hypertension, hyperlipidemia  poorly controlled diabetes complicated by neuropathy, ESRD on dialysis, CAD, COPD, left BKA, heart failure with defibrillator in place  Per Dr. Estell Harpin this patient has had weeks of monocular double vision in both of his eyes without any other neurological symptoms and neurological examination otherwise completely intact  My colleague had recommended MRI for screening but unfortunately due to pacemaker this cannot happen at [facility] and I was curb sided for further recommendations.  Per Dr. Estell Harpin symptoms have not been rapidly progressive, patient is presenting due to nonresolution of symptoms.  He did see an optometrist on Tuesday that reportedly diagnosed him with foreign body in the left eye (which would explain left eye double vision but not right eye double vision)  As described the symptoms would localize to the eye and not to the brain.  I recommend consultation with ophthalmology and consideration of the eyedrops to see if these improve his symptoms as a dry eye is a common cause of monocular diplopia  Additionally given the chronicity of the symptoms inpatient stroke workup would be of limited benefit

## 2022-08-05 NOTE — ED Triage Notes (Signed)
Pt seen at [facility] yesterday for same.  Had Ct there, Recommended MRI however pt has a defibrillator.  Pt referred to ophthalmologist who he saw today and they recommended CTA of the brain.  Pt presents for same.  Pt states his "eyelids are drooping"

## 2022-08-06 DIAGNOSIS — N186 End stage renal disease: Secondary | ICD-10-CM | POA: Diagnosis not present

## 2022-08-06 DIAGNOSIS — N2581 Secondary hyperparathyroidism of renal origin: Secondary | ICD-10-CM | POA: Diagnosis not present

## 2022-08-06 DIAGNOSIS — Z992 Dependence on renal dialysis: Secondary | ICD-10-CM | POA: Diagnosis not present

## 2022-08-06 DIAGNOSIS — D509 Iron deficiency anemia, unspecified: Secondary | ICD-10-CM | POA: Diagnosis not present

## 2022-08-06 DIAGNOSIS — N250 Renal osteodystrophy: Secondary | ICD-10-CM | POA: Diagnosis not present

## 2022-08-08 ENCOUNTER — Encounter (HOSPITAL_COMMUNITY): Payer: Self-pay | Admitting: *Deleted

## 2022-08-08 NOTE — Progress Notes (Unsigned)
[facility]  PATIENT: Anthony Bullock DOB: 1960/05/19  REFERRING DOCTOR OR PCP: Evelena Peat MD; Gwyneth Sprout, MD SOURCE: Patient, notes from ED/hospital, multiple imaging and lab reports reviewed, notable CT scan images reviewed.  _________________________________   HISTORICAL  CHIEF COMPLAINT:  Chief Complaint  Patient presents with   New Patient (Initial Visit)    Pt in room 10, wife in room. New patient here for Total right oculomotor nerve palsy. Pt reports pain in right eye and double vision.     HISTORY OF PRESENT ILLNESS:  I had the pleasure seeing your patient, Anthony Bullock, at [facility] for neurologic consultation regarding his right 3rd nerve palsy.  He is a 62 year old man with diabetes mellitus, hypertension and end-stage renal disease on hemodialysis.  Who presented to the emergency room 08/04/2022 with binocular diplopia and ptosis.  He also had some right eye pain.  The vision was unchanged.     CT scan of the time showed no acute findings.  There is some calcification within the vertebral and internal carotid arteries.  He was referred to his ophthalmologist who diagnosed a right 3rd nerve palsy (likely due to diabetes) and sent him back to the emergency room.  CT angiogram 08/05/2022 showed no large vessel occlusion.  However, there was severe stenosis of the cavernous internal carotid arteries bilaterally and moderate atherosclerosis of the distal left common carotid artery and bifurcation (not hemodynamically significant).  There is moderate narrowing of the left vertebral artery origin.  There is calcification in the intracerebral vertebral arteries  Compared to a few days ago, the weakness of the EOM are the same.  Pain actually improved when the eye was dilated but worsened again the next day.   Vascular risks: He has diabetes mellitus, hypertension, end-stage renal disease on hemodialysis. H/o tobacco,  He has peripheral  vascular disease and has had bilateral leg amputations.  IMAGING: Note: He is not an MRI candidate due to a noncompatible AICD.  CT head 08/04/2022 shows no acute findings.  There is mild chronic microvascular ischemic change noted.  Calcification is noted within the vertebral arteries and internal carotid arteries.  Calcification of the falx.  CTA Head/neck 08/05/2022 showed: 1. No emergent large vessel occlusion. 2. Severe stenosis of both cavernous internal carotid arteries secondary to calcific atherosclerosis. 3. Moderate mixed density atherosclerosis within the distal left common carotid artery and at the carotid bifurcation without hemodynamically significant stenosis of the internal carotid arteries. 4. Moderate narrowing of the left vertebral artery origin due to calcific atherosclerosis. (5) Also, by my read, there is calcification with some stenosis of both V4 segments of the vertebral arteries.  Carotid Doppler report 07/29/2021 showed 1 to 39% stenosis in both ICAs and less than 50% stenosis in the left CCA and greater than 50% stenosis in the left ECA.  The left vertebral artery had retrograde flow and left subclavian artery flow was disturbed.  Labs: Cholesterol and LDL were in normal limits.  Triglycerides were mildly elevated. Hemoglobin A1c was 8.7.  REVIEW OF SYSTEMS: Constitutional: No fevers, chills, sweats, or change in appetite Eyes: No visual changes, double vision, eye pain Ear, nose and throat: No hearing loss.  Vision issues as above.  He has cardiovascular: No chest pain, palpitations Respiratory:  No shortness of breath at rest or with exertion.   No wheezes GastrointestinaI: No nausea, vomiting, diarrhea, abdominal pain, fecal incontinence Genitourinary:  No dysuria, urinary retention or frequency.  No nocturia. Musculoskeletal:  No  neck pain, back pain Integumentary/extremity: In the past he had poorly healing diabetic ulcers.  He is status post lateral BKA  amputation Neurological: as above Psychiatric: No depression at this time.  No anxiety Endocrine: No palpitations, diaphoresis, change in appetite, change in weigh or increased thirst.  Reports diabetes is well-controlled now but was poorly controlled in the past. Hematologic/Lymphatic:  No anemia, purpura, petechiae. Allergic/Immunologic: No itchy/runny eyes, nasal congestion, recent allergic reactions, rashes  ALLERGIES: Allergies  Allergen Reactions   Epoetin Alfa Other (See Comments)    unknown   Ferumoxytol Other (See Comments)    unknown   Morphine Sulfate Rash and Other (See Comments)    Itches all over, red spots    HOME MEDICATIONS:  Current Outpatient Medications:    acetaminophen (TYLENOL) 325 MG tablet, Take 1-2 tablets (325-650 mg total) by mouth every 4 (four) hours as needed for mild pain., Disp: , Rfl:    aspirin EC 81 MG tablet, Take 81 mg by mouth daily. , Disp: , Rfl:    atorvastatin (LIPITOR) 80 MG tablet, TAKE 1 TABLET BY MOUTH AT BEDTIME, Disp: 90 tablet, Rfl: 3   azelastine (ASTELIN) 0.1 % nasal spray, Place 2 sprays into both nostrils 3 (three) times daily as needed for rhinitis. Use in each nostril as directed, Disp: 30 mL, Rfl: 12   CALCITRIOL PO, Take by mouth 3 (three) times a week. ONLY PROVIDED AT DIALYSIS, Disp: , Rfl:    carvedilol (COREG) 12.5 MG tablet, Take 1 tablet by mouth twice daily, Disp: 180 tablet, Rfl: 0   cinacalcet (SENSIPAR) 30 MG tablet, Take 1 tablet (30 mg total) by mouth Every Tuesday,Thursday,and Saturday with dialysis., Disp: 60 tablet, Rfl:    Continuous Glucose Sensor (FREESTYLE LIBRE 14 DAY SENSOR) MISC, USE AS DIRECTED EVERY 14 DAYS, Disp: 6 each, Rfl: 3   Continuous Glucose Sensor (FREESTYLE LIBRE 3 SENSOR) MISC, Place 1 sensor on the skin every 14 days. Use to check glucose continuously, Disp: 2 each, Rfl: 11   fenofibrate 160 MG tablet, TAKE 1 TABLET BY MOUTH ONCE DAILY, Disp: 90 tablet, Rfl: 3   fexofenadine (ALLEGRA) 180 MG  tablet, Take 180 mg by mouth daily., Disp: , Rfl:    furosemide (LASIX) 40 MG tablet, Take 3 tablets by mouth once daily, Disp: 270 tablet, Rfl: 0   gabapentin (NEURONTIN) 300 MG capsule, TAKE 2 CAPSULES BY MOUTH TWICE DAILY ., Disp: 360 capsule, Rfl: 3   glucose blood test strip, Check 1 time daily. E11.9 One Touch Ultra Blue Test Strips, Disp: 100 each, Rfl: 3   HUMALOG KWIKPEN 200 UNIT/ML KwikPen, INJECT A MAXIMUM OF 28 UNITS SUBCUTANEOUSLY TWICE DAILY WITH LUNCH AND SUPPER PER SLIDING SCALE. APPOINTMENT REQUIRED FOR FUTURE REFILLS, Disp: 6 mL, Rfl: 1   Insulin Pen Needle (BD PEN NEEDLE NANO U/F) 32G X 4 MM MISC, USE 1 PEN NEEDLE SUBCUTANEOUSLY WITH INSULIN 4 TIMES DAILY, Disp: 400 each, Rfl: 0   lanthanum (FOSRENOL) 500 MG chewable tablet, Chew 500-1,000 mg by mouth See admin instructions. Take 1000 mg with meals three time a day and 500 mg with snacks, Disp: , Rfl:    LANTUS SOLOSTAR 100 UNIT/ML Solostar Pen, INJECT 12 UNITS SUBCUTANEOUSLY AT BEDTIME, Disp: 15 mL, Rfl: 0   midodrine (PROAMATINE) 10 MG tablet, Take 10 mg by mouth 3 (three) times a week., Disp: , Rfl:    multivitamin (RENA-VIT) TABS tablet, Take 1 tablet by mouth once daily, Disp: 10 tablet, Rfl: 3   nortriptyline (PAMELOR)  25 MG capsule, Take 1 capsule (25 mg total) by mouth at bedtime., Disp: 30 capsule, Rfl: 3   Olopatadine HCl 0.2 % SOLN, Place 1 drop into both eyes daily as needed (for allergies). , Disp: , Rfl:    VENTOLIN HFA 108 (90 Base) MCG/ACT inhaler, Inhale 1-2 puffs into the lungs every 6 (six) hours as needed for wheezing or shortness of breath., Disp: 18 g, Rfl: 1   benzonatate (TESSALON PERLES) 100 MG capsule, 1-2 capsules up to twice daily as needed for cough (Patient not taking: Reported on 08/09/2022), Disp: 30 capsule, Rfl: 0   ondansetron (ZOFRAN ODT) 8 MG disintegrating tablet, Take 1 tablet (8 mg total) by mouth every 8 (eight) hours as needed for nausea or vomiting. (Patient not taking: Reported on  08/09/2022), Disp: 15 tablet, Rfl: 0  PAST MEDICAL HISTORY: Past Medical History:  Diagnosis Date   AICD (automatic cardioverter/defibrillator) present    [employer]   Allergic rhinitis    Anemia    Arthritis    Chronic systolic heart failure (HCC)    a. ECHO (12/2012) EF 25-30%, HK entireanteroseptal myocardium //  b.  EF 25%, diffuse HK, grade 1 diastolic dysfunction, MAC, mild LAE, normal RVSF, trivial pericardial effusion   COPD (chronic obstructive pulmonary disease) (HCC)    Diabetes mellitus type II    Diabetic nephropathy (HCC)    Diabetic neuropathy (HCC)    ESRD on hemodialysis (HCC)    started HD June 2017, goes to [facility] HD unit, Dr Fausto Skillern   History of cardiac catheterization    a.Myoview 1/15:  There is significant left ventricular dysfunction. There may be slight scar at the apex. There is no significant ischemia. LV Ejection Fraction: 27%  //  b. RHC/LHC (1/15) with mean RA 6, PA 47/22 mean 33, mean PCWP 20, PVR 2.5 WU, CI 2.5; 80% dLAD stenosis, 70% diffuse large D.     History of kidney stones    Hyperlipidemia    Hypertension    Kidney stones    NICM (nonischemic cardiomyopathy) (HCC)    Primarily nonischemic.  Echo (12/14) with EF 25-30%.  Echo (3/15) with EF 25%, mild to moderately dilated LV, normal RV size and systolic function.     Osteomyelitis (HCC)    left fifth ray   Pneumonia    Urethral stricture    Wears glasses     PAST SURGICAL HISTORY: Past Surgical History:  Procedure Laterality Date   ABDOMINAL AORTOGRAM W/LOWER EXTREMITY N/A 03/30/2016   Procedure: Abdominal Aortogram w/Lower Extremity;  Surgeon: Chuck Hint, MD;  Location: [facility] INVASIVE CV LAB;  Service: Cardiovascular;  Laterality: N/A;   AMPUTATION Right 04/26/2016   Procedure: Right Below Knee Amputation;  Surgeon: Nadara Mustard, MD;  Location: [facility] OR;  Service: Orthopedics;  Laterality: Right;   AMPUTATION Left 08/21/2019   Procedure: LEFT FOOT 5TH RAY AMPUTATION;   Surgeon: Nadara Mustard, MD;  Location: [facility] OR;  Service: Orthopedics;  Laterality: Left;   AMPUTATION Left 11/13/2019   Procedure: LEFT BELOW KNEE AMPUTATION;  Surgeon: Nadara Mustard, MD;  Location: [facility] OR;  Service: Orthopedics;  Laterality: Left;   AV FISTULA PLACEMENT Right 09/08/2015   Procedure: INSERTION OF 4-18mm x 45cm  ARTERIOVENOUS (AV) GORE-TEX GRAFT RIGHT UPPER  ARM;  Surgeon: Chuck Hint, MD;  Location: [facility] OR;  Service: Vascular;  Laterality: Right;   AV FISTULA PLACEMENT Left 01/14/2016   Procedure: CREATION OF LEFT UPPER ARM ARTERIOVENOUS FISTULA;  Surgeon: Cristal Deer  Adele Dan, MD;  Location: [facility] OR;  Service: Vascular;  Laterality: Left;   BASCILIC VEIN TRANSPOSITION Right 08/22/2014   Procedure: RIGHT UPPER ARM BASCILIC VEIN TRANSPOSITION;  Surgeon: Chuck Hint, MD;  Location: [facility] OR;  Service: Vascular;  Laterality: Right;   BELOW KNEE LEG AMPUTATION Right 04/26/2016   CARDIAC CATHETERIZATION     CARDIAC DEFIBRILLATOR PLACEMENT  06/27/2013   Sub Q       BY DR Graciela Husbands   CATARACT EXTRACTION W/PHACO Right 08/06/2018   Procedure: CATARACT EXTRACTION PHACO AND INTRAOCULAR LENS PLACEMENT (IOC);  Surgeon: Fabio Pierce, MD;  Location: [facility] ORS;  Service: Ophthalmology;  Laterality: Right;  CDE: 4.06   CATARACT EXTRACTION W/PHACO Left 08/20/2018   Procedure: CATARACT EXTRACTION PHACO AND INTRAOCULAR LENS PLACEMENT (IOC);  Surgeon: Fabio Pierce, MD;  Location: [facility] ORS;  Service: Ophthalmology;  Laterality: Left;  CDE: 6.76   COLONOSCOPY WITH PROPOFOL N/A 07/22/2015   Procedure: COLONOSCOPY WITH PROPOFOL;  Surgeon: Sherrilyn Rist, MD;  Location: [facility] END[facility];  Service: Gastroenterology;  Laterality: N/A;   FEMORAL-POPLITEAL BYPASS GRAFT Right 03/31/2016   Procedure: BYPASS GRAFT FEMORAL-POPLITEAL ARTERY USING RIGHT GREATER SAPHENOUS NONREVERSED VEIN;  Surgeon: Chuck Hint, MD;  Location: [facility] OR;  Service: Vascular;  Laterality: Right;   HERNIA REPAIR     I & D  EXTREMITY Right 03/31/2016   Procedure: IRRIGATION AND DEBRIDEMENT FOOT;  Surgeon: Chuck Hint, MD;  Location: [facility] OR;  Service: Vascular;  Laterality: Right;   IMPLANTABLE CARDIOVERTER DEFIBRILLATOR IMPLANT N/A 06/27/2013   Procedure: SUB Q ICD;  Surgeon: Duke Salvia, MD;  Location: [facility] CATH LAB;  Service: Cardiovascular;  Laterality: N/A;   INTRAOPERATIVE ARTERIOGRAM Right 03/31/2016   Procedure: INTRA OPERATIVE ARTERIOGRAM;  Surgeon: Chuck Hint, MD;  Location: [facility] OR;  Service: Vascular;  Laterality: Right;   IR GENERIC HISTORICAL Right 11/30/2015   IR THROMBECTOMY AV FISTULA W/THROMBOLYSIS/PTA INC/SHUNT/IMG RIGHT 11/30/2015 Irish Lack, MD [facility]   IR GENERIC HISTORICAL  11/30/2015   IR US GUIDE VASC ACCESS RIGHT 11/30/2015 Irish Lack, MD [facility]   IR GENERIC HISTORICAL Right 12/15/2015   IR THROMBECTOMY AV FISTULA W/THROMBOLYSIS/PTA/STENT INC/SHUNT/IMG RT 12/15/2015 Oley Balm, MD [facility]   IR GENERIC HISTORICAL  12/15/2015   IR US GUIDE VASC ACCESS RIGHT 12/15/2015 Oley Balm, MD [facility]   IR GENERIC HISTORICAL  12/28/2015   IR FLUORO GUIDE CV LINE RIGHT 12/28/2015 Jolaine Click, MD [facility]   IR GENERIC HISTORICAL  12/28/2015   IR US GUIDE VASC ACCESS RIGHT 12/28/2015 Jolaine Click, MD [facility]   LEFT A ND RIGHT HEART CATH  01/30/2013   DR Jones Broom   LEFT AND RIGHT HEART CATHETERIZATION WITH CORONARY ANGIOGRAM N/A 01/30/2013   Procedure: LEFT AND RIGHT HEART CATHETERIZATION WITH CORONARY ANGIOGRAM;  Surgeon: Dolores Patty, MD;  Location: [facility] CATH LAB;  Service: Cardiovascular;  Laterality: N/A;   PERIPHERAL VASCULAR CATHETERIZATION Right 01/26/2015   Procedure: A/V Fistulagram;  Surgeon: Chuck Hint, MD;  Location: [facility] INVASIVE CV LAB;  Service: Cardiovascular;  Laterality: Right;   reapea urethral surgery for recurrent obstruction  2011   TOTAL KNEE ARTHROPLASTY Right 2007   VEIN HARVEST Right 03/31/2016    Procedure: RIGHT GREATER SAPHENOUS VEIN HARVEST;  Surgeon: Chuck Hint, MD;  Location: [facility] OR;  Service: Vascular;  Laterality: Right;    FAMILY HISTORY: Family History  Problem Relation Age of Onset   Bladder Cancer Mother    Alcohol abuse Father  Melanoma Father    Stroke Maternal Grandmother    Heart Problems Maternal Grandmother        unknown   Diabetes Maternal Grandmother    Heart disease Maternal Grandfather    Prostate cancer Maternal Grandfather     SOCIAL HISTORY: Social History   Socioeconomic History   Marital status: Married    Spouse name: Not on file   Number of children: 0   Years of education: Not on file   Highest education level: Not on file  Occupational History   Not on file  Tobacco Use   Smoking status: Former    Current packs/day: 0.00    Average packs/day: 2.0 packs/day for 32.0 years (64.0 ttl pk-yrs)    Types: Cigarettes    Start date: 05/11/1978    Quit date: 05/11/2010    Years since quitting: 12.2   Smokeless tobacco: Never  Vaping Use   Vaping status: Never Used  Substance and Sexual Activity   Alcohol use: No   Drug use: No   Sexual activity: Yes  Other Topics Concern   Not on file  Social History Narrative   Works at [employer] as a Therapist, music      Right handed   Wears glasses    Drinks 10 oz coffee daily   Drinks 2 sodas per day   Social Determinants of Health   Financial Resource Strain: Low Risk  (01/24/2022)   Overall Financial Resource Strain (CARDIA)    Difficulty of Paying Living Expenses: Not hard at all  Food Insecurity: No Food Insecurity (05/10/2022)   Hunger Vital Sign    Worried About Running Out of Food in the Last Year: Never true    Ran Out of Food in the Last Year: Never true  Transportation Needs: No Transportation Needs (05/10/2022)   PRAPARE - Administrator, Civil Service (Medical): No    Lack of Transportation (Non-Medical): No  Physical Activity: Inactive (01/24/2022)   Exercise Vital  Sign    Days of Exercise per Week: 0 days    Minutes of Exercise per Session: 0 min  Stress: No Stress Concern Present (01/24/2022)   [employer] of Occupational Health - Occupational Stress Questionnaire    Feeling of Stress : Not at all  Social Connections: Socially Integrated (01/24/2022)   Social Connection and Isolation Panel [NHANES]    Frequency of Communication with Friends and Family: More than three times a week    Frequency of Social Gatherings with Friends and Family: More than three times a week    Attends Religious Services: More than 4 times per year    Active Member of [employer] or Organizations: Yes    Attends Banker Meetings: More than 4 times per year    Marital Status: Married  Catering manager Violence: Not At Risk (01/24/2022)   Humiliation, Afraid, Rape, and Kick questionnaire    Fear of Current or Ex-Partner: No    Emotionally Abused: No    Physically Abused: No    Sexually Abused: No       PHYSICAL EXAM  Vitals:   08/09/22 1245  BP: 132/66  Pulse: 78  Weight: 213 lb (96.6 kg)  Height: 5\' 11"  (1.803 m)    Body mass index is 29.71 kg/m.   General: The patient is well-developed and well-nourished and in no acute distress.  He has bilateral leg prosthesis lateral BKA)  HEENT:  Head is [place]/AT.  Sclera are anicteric.  Funduscopic exam shows  normal optic discs.  Neck: Soft left carotid bruit noted.   The neck is nontender.  Cardiovascular: The heart has a regular rate and rhythm with a normal S1 and S2. There were no murmurs, gallops or rubs.    Skin: Extremities are without rash or  edema.  Musculoskeletal:  Back is nontender  Neurologic Exam  Mental status: The patient is alert and oriented x 3 at the time of the examination. The patient has apparent normal recent and remote memory, with an apparently normal attention span and concentration ability.   Speech is normal.  Cranial nerves: He has a partial 3rd nerve palsy on the right  with decreased ability to adduct the eye and to elevate.  There is ptosis.  Normal extraocular muscles on the left.  Pupils are equal, round, and reactive to light and accomodation.  Visual acuity was fairly symmetric out of either eye independently..  Facial symmetry is present. There is good facial sensation to soft touch bilaterally.Facial strength is normal.  Trapezius and sternocleidomastoid strength is normal. No dysarthria is noted.  The tongue is midline, and the patient has symmetric elevation of the soft palate. No obvious hearing deficits are noted.  Motor:  Muscle bulk is normal.   Tone is normal. Strength is  5 / 5 in arms and proximal legs.  Sensory: Sensory testing is intact to pinprick, soft touch and vibration sensation in the arms and legs above the knee  Coordination: Cerebellar testing reveals good finger-nose-finger bilaterally.  Gait and station: Station is normal.   Requires a cane due to bilateral leg prostheses   Reflexes: Deep tendon reflexes are symmetric and normal in the arms.      DIAGNOSTIC DATA (LABS, IMAGING, TESTING) - I reviewed patient records, labs, notes, testing and imaging myself where available.  Lab Results  Component Value Date   WBC 8.1 08/05/2022   HGB 12.2 (L) 08/05/2022   HCT 38.1 (L) 08/05/2022   MCV 91.4 08/05/2022   PLT 158 08/05/2022      Component Value Date/Time   NA 136 08/05/2022 2050   K 4.5 08/05/2022 2050   CL 96 (L) 08/05/2022 2050   CO2 28 08/05/2022 2050   GLUCOSE 216 (H) 08/05/2022 2050   BUN 57 (H) 08/05/2022 2050   BUN 75 (A) 11/07/2017 0000   CREATININE 10.46 (H) 08/05/2022 2050   CALCIUM 8.2 (L) 08/05/2022 2050   CALCIUM 13.6 (HH) 06/04/2015 1300   PROT 6.8 08/05/2022 2050   ALBUMIN 3.2 (L) 08/05/2022 2050   AST 24 08/05/2022 2050   ALT 20 08/05/2022 2050   ALKPHOS 80 08/05/2022 2050   BILITOT 0.3 08/05/2022 2050   GFRNONAA 5 (L) 08/05/2022 2050   GFRAA 8 (L) 09/04/2019 0420   Lab Results  Component Value  Date   CHOL 132 06/24/2022   HDL 38.50 (L) 06/24/2022   LDLCALC 57 06/24/2022   LDLDIRECT 134.0 04/25/2011   TRIG 181.0 (H) 06/24/2022   CHOLHDL 3 06/24/2022   Lab Results  Component Value Date   HGBA1C 8.7 (A) 06/24/2022   Lab Results  Component Value Date   VITAMINB12 901 09/29/2014   Lab Results  Component Value Date   TSH 1.61 06/26/2020       ASSESSMENT AND PLAN  3rd nerve palsy, partial, right  Stenosis of intracranial portion of both internal carotid arteries  Left carotid stenosis  Diplopia  Ptosis, right  Coronary artery disease involving native coronary artery of native heart without angina pectoris  Peripheral vascular disease (HCC)   In summary, Mr. Maratea is a 62 year old man with multiple vascular risk factors including diabetes, hypertension, hyperlipidemia, peripheral vascular disease, history of tobacco who presented with a partial 3rd nerve palsy on 08/04/2022.  I discussed with him and his wife that the etiology is more likely to be due to his diabetes and hypertension rather than to the intracranial stenosis.  The most important treatment is to best treat the comorbidities (diabetes, hypertension) he should continue aspirin.  I would consider a combination of aspirin and Plavix but the risk of bleeding is higher with DAPT for patients on dialysis.  I also considered referral for conventional angiogram with the possibility of stenting.  However, cavernous ICA is not the most amenable to this procedure and would carry high risk additionally I think the 3rd nerve palsy was due to his vascular risk factors rather than to the stenosis.  We will recheck a CT angiogram in 6 to 9 months and consider referral if the intracranial stenosis has significantly progressed.  In the meantime, he will continue the aspirin and is advised to keep the hypertension and diabetes under the best control.  He also has significant headache on the right.  Since pain has persisted  for over a week I will have him take low-dose nortriptyline in the hope that that will help with the pain.  He can stop after a month or 2 if pain improves.  He will return to see me in 6 months or sooner if there are new or worsening neurologic symptoms.   Check ESR/CRP (I placed the order after he had left the office so we will try to see if he can come back sometime over the next week)  Thank you for asking me to see Mr. Whitted.  Please let me know if I can be of further assistance with him or the patient's infection.   Javonn Gauger A. Epimenio Foot, MD, [facility] 08/09/2022, 2:08 PM Certified in Neurology, Clinical Neurophysiology, Sleep Medicine and Neuroimaging  [facility] [address], [address] [place], [place] [address] [phone]

## 2022-08-09 ENCOUNTER — Encounter: Payer: Self-pay | Admitting: Neurology

## 2022-08-09 ENCOUNTER — Encounter (HOSPITAL_COMMUNITY): Payer: Self-pay | Admitting: *Deleted

## 2022-08-09 ENCOUNTER — Ambulatory Visit: Payer: Medicare Other | Admitting: Neurology

## 2022-08-09 ENCOUNTER — Other Ambulatory Visit: Payer: Self-pay

## 2022-08-09 ENCOUNTER — Telehealth: Payer: Self-pay | Admitting: *Deleted

## 2022-08-09 VITALS — BP 132/66 | HR 78 | Ht 71.0 in | Wt 213.0 lb

## 2022-08-09 DIAGNOSIS — I6522 Occlusion and stenosis of left carotid artery: Secondary | ICD-10-CM | POA: Insufficient documentation

## 2022-08-09 DIAGNOSIS — H4901 Third [oculomotor] nerve palsy, right eye: Secondary | ICD-10-CM

## 2022-08-09 DIAGNOSIS — H532 Diplopia: Secondary | ICD-10-CM

## 2022-08-09 DIAGNOSIS — I6523 Occlusion and stenosis of bilateral carotid arteries: Secondary | ICD-10-CM | POA: Diagnosis not present

## 2022-08-09 DIAGNOSIS — R519 Headache, unspecified: Secondary | ICD-10-CM

## 2022-08-09 DIAGNOSIS — I739 Peripheral vascular disease, unspecified: Secondary | ICD-10-CM

## 2022-08-09 DIAGNOSIS — H02401 Unspecified ptosis of right eyelid: Secondary | ICD-10-CM

## 2022-08-09 DIAGNOSIS — I2510 Atherosclerotic heart disease of native coronary artery without angina pectoris: Secondary | ICD-10-CM

## 2022-08-09 DIAGNOSIS — N2581 Secondary hyperparathyroidism of renal origin: Secondary | ICD-10-CM | POA: Diagnosis not present

## 2022-08-09 DIAGNOSIS — Z992 Dependence on renal dialysis: Secondary | ICD-10-CM | POA: Diagnosis not present

## 2022-08-09 DIAGNOSIS — N186 End stage renal disease: Secondary | ICD-10-CM | POA: Diagnosis not present

## 2022-08-09 DIAGNOSIS — D509 Iron deficiency anemia, unspecified: Secondary | ICD-10-CM | POA: Diagnosis not present

## 2022-08-09 DIAGNOSIS — N250 Renal osteodystrophy: Secondary | ICD-10-CM | POA: Diagnosis not present

## 2022-08-09 MED ORDER — NORTRIPTYLINE HCL 25 MG PO CAPS: 25.0000 mg | ORAL_CAPSULE | Freq: Every day | ORAL | 3 refills | Status: DC

## 2022-08-09 NOTE — Telephone Encounter (Signed)
-----   Message from Asa Lente sent at 08/09/2022  2:17 PM EDT ----- Regarding: labs I would like to check 2 lab tests, sed rate and CRP.  I placed the orders.  Unfortunately, I thought of this while dictating the note, after the patient had left.  I placed the orders to be done in our office but if it is difficult for him to come back to [place] during regular business hours in the next week or so we could also see if this could be done closer to his home

## 2022-08-09 NOTE — Telephone Encounter (Signed)
I called pt and he is ok to come back here for labs to be drawn.

## 2022-08-10 DIAGNOSIS — N186 End stage renal disease: Secondary | ICD-10-CM | POA: Diagnosis not present

## 2022-08-10 DIAGNOSIS — Z992 Dependence on renal dialysis: Secondary | ICD-10-CM | POA: Diagnosis not present

## 2022-08-11 ENCOUNTER — Telehealth: Payer: Self-pay | Admitting: Family Medicine

## 2022-08-11 DIAGNOSIS — N2581 Secondary hyperparathyroidism of renal origin: Secondary | ICD-10-CM | POA: Diagnosis not present

## 2022-08-11 DIAGNOSIS — D631 Anemia in chronic kidney disease: Secondary | ICD-10-CM | POA: Diagnosis not present

## 2022-08-11 DIAGNOSIS — N250 Renal osteodystrophy: Secondary | ICD-10-CM | POA: Diagnosis not present

## 2022-08-11 DIAGNOSIS — Z992 Dependence on renal dialysis: Secondary | ICD-10-CM | POA: Diagnosis not present

## 2022-08-11 DIAGNOSIS — N186 End stage renal disease: Secondary | ICD-10-CM | POA: Diagnosis not present

## 2022-08-11 MED ORDER — FREESTYLE LIBRE 3 READER DEVI: 0 refills | Status: DC

## 2022-08-11 NOTE — Telephone Encounter (Signed)
Pt is calling and needs new rx (FREESTYLE LIBRE 3 reader please send to  [facility] [address] [place]  [address] Phone: [phone]  Fax: [phone]

## 2022-08-11 NOTE — Telephone Encounter (Signed)
FREESTYLE LIBRE 3  sent to pharmacy

## 2022-08-12 ENCOUNTER — Other Ambulatory Visit: Payer: Self-pay | Admitting: Family Medicine

## 2022-08-12 ENCOUNTER — Telehealth: Payer: Self-pay | Admitting: Neurology

## 2022-08-12 NOTE — Telephone Encounter (Signed)
Pt is needing to speak to the provider due to him starting to vomit in the morning, Pt is wanting to know if this is a symptom of the Nerve Palsy. Please advise.

## 2022-08-13 DIAGNOSIS — N2581 Secondary hyperparathyroidism of renal origin: Secondary | ICD-10-CM | POA: Diagnosis not present

## 2022-08-13 DIAGNOSIS — Z992 Dependence on renal dialysis: Secondary | ICD-10-CM | POA: Diagnosis not present

## 2022-08-13 DIAGNOSIS — N250 Renal osteodystrophy: Secondary | ICD-10-CM | POA: Diagnosis not present

## 2022-08-13 DIAGNOSIS — D631 Anemia in chronic kidney disease: Secondary | ICD-10-CM | POA: Diagnosis not present

## 2022-08-13 DIAGNOSIS — N186 End stage renal disease: Secondary | ICD-10-CM | POA: Diagnosis not present

## 2022-08-16 DIAGNOSIS — N250 Renal osteodystrophy: Secondary | ICD-10-CM | POA: Diagnosis not present

## 2022-08-16 DIAGNOSIS — N186 End stage renal disease: Secondary | ICD-10-CM | POA: Diagnosis not present

## 2022-08-16 DIAGNOSIS — N2581 Secondary hyperparathyroidism of renal origin: Secondary | ICD-10-CM | POA: Diagnosis not present

## 2022-08-16 DIAGNOSIS — Z992 Dependence on renal dialysis: Secondary | ICD-10-CM | POA: Diagnosis not present

## 2022-08-16 DIAGNOSIS — D631 Anemia in chronic kidney disease: Secondary | ICD-10-CM | POA: Diagnosis not present

## 2022-08-18 ENCOUNTER — Encounter (HOSPITAL_COMMUNITY): Payer: Self-pay

## 2022-08-18 ENCOUNTER — Inpatient Hospital Stay (HOSPITAL_COMMUNITY)
Admission: EM | Admit: 2022-08-18 | Discharge: 2022-08-26 | DRG: 189 | Disposition: A | Discharge status: Home / Self Care | Payer: Medicare Other | Source: Other Acute Inpatient Hospital | Attending: Internal Medicine | Admitting: Internal Medicine

## 2022-08-18 DIAGNOSIS — R0902 Hypoxemia: Secondary | ICD-10-CM | POA: Diagnosis not present

## 2022-08-18 DIAGNOSIS — Z794 Long term (current) use of insulin: Secondary | ICD-10-CM

## 2022-08-18 DIAGNOSIS — N2581 Secondary hyperparathyroidism of renal origin: Secondary | ICD-10-CM | POA: Diagnosis present

## 2022-08-18 DIAGNOSIS — R079 Chest pain, unspecified: Secondary | ICD-10-CM | POA: Diagnosis not present

## 2022-08-18 DIAGNOSIS — J9601 Acute respiratory failure with hypoxia: Secondary | ICD-10-CM | POA: Diagnosis not present

## 2022-08-18 DIAGNOSIS — E1142 Type 2 diabetes mellitus with diabetic polyneuropathy: Secondary | ICD-10-CM | POA: Diagnosis present

## 2022-08-18 DIAGNOSIS — Z8249 Family history of ischemic heart disease and other diseases of the circulatory system: Secondary | ICD-10-CM

## 2022-08-18 DIAGNOSIS — J431 Panlobular emphysema: Secondary | ICD-10-CM | POA: Diagnosis not present

## 2022-08-18 DIAGNOSIS — R0989 Other specified symptoms and signs involving the circulatory and respiratory systems: Secondary | ICD-10-CM | POA: Diagnosis not present

## 2022-08-18 DIAGNOSIS — Z961 Presence of intraocular lens: Secondary | ICD-10-CM | POA: Diagnosis present

## 2022-08-18 DIAGNOSIS — Z87891 Personal history of nicotine dependence: Secondary | ICD-10-CM

## 2022-08-18 DIAGNOSIS — I959 Hypotension, unspecified: Secondary | ICD-10-CM | POA: Diagnosis not present

## 2022-08-18 DIAGNOSIS — Z833 Family history of diabetes mellitus: Secondary | ICD-10-CM

## 2022-08-18 DIAGNOSIS — Z79899 Other long term (current) drug therapy: Secondary | ICD-10-CM | POA: Diagnosis not present

## 2022-08-18 DIAGNOSIS — R918 Other nonspecific abnormal finding of lung field: Secondary | ICD-10-CM | POA: Diagnosis not present

## 2022-08-18 DIAGNOSIS — Z96651 Presence of right artificial knee joint: Secondary | ICD-10-CM | POA: Diagnosis present

## 2022-08-18 DIAGNOSIS — J810 Acute pulmonary edema: Secondary | ICD-10-CM

## 2022-08-18 DIAGNOSIS — I132 Hypertensive heart and chronic kidney disease with heart failure and with stage 5 chronic kidney disease, or end stage renal disease: Secondary | ICD-10-CM | POA: Diagnosis not present

## 2022-08-18 DIAGNOSIS — E1151 Type 2 diabetes mellitus with diabetic peripheral angiopathy without gangrene: Secondary | ICD-10-CM | POA: Diagnosis present

## 2022-08-18 DIAGNOSIS — I5043 Acute on chronic combined systolic (congestive) and diastolic (congestive) heart failure: Secondary | ICD-10-CM | POA: Diagnosis not present

## 2022-08-18 DIAGNOSIS — Z9841 Cataract extraction status, right eye: Secondary | ICD-10-CM

## 2022-08-18 DIAGNOSIS — D631 Anemia in chronic kidney disease: Secondary | ICD-10-CM | POA: Diagnosis not present

## 2022-08-18 DIAGNOSIS — I4891 Unspecified atrial fibrillation: Secondary | ICD-10-CM | POA: Diagnosis not present

## 2022-08-18 DIAGNOSIS — E785 Hyperlipidemia, unspecified: Secondary | ICD-10-CM | POA: Diagnosis present

## 2022-08-18 DIAGNOSIS — E871 Hypo-osmolality and hyponatremia: Secondary | ICD-10-CM | POA: Diagnosis not present

## 2022-08-18 DIAGNOSIS — I953 Hypotension of hemodialysis: Secondary | ICD-10-CM | POA: Diagnosis present

## 2022-08-18 DIAGNOSIS — J449 Chronic obstructive pulmonary disease, unspecified: Secondary | ICD-10-CM | POA: Diagnosis present

## 2022-08-18 DIAGNOSIS — J189 Pneumonia, unspecified organism: Secondary | ICD-10-CM | POA: Diagnosis present

## 2022-08-18 DIAGNOSIS — Z89512 Acquired absence of left leg below knee: Secondary | ICD-10-CM

## 2022-08-18 DIAGNOSIS — I428 Other cardiomyopathies: Secondary | ICD-10-CM | POA: Diagnosis not present

## 2022-08-18 DIAGNOSIS — Z8673 Personal history of transient ischemic attack (TIA), and cerebral infarction without residual deficits: Secondary | ICD-10-CM

## 2022-08-18 DIAGNOSIS — R231 Pallor: Secondary | ICD-10-CM | POA: Diagnosis not present

## 2022-08-18 DIAGNOSIS — Z885 Allergy status to narcotic agent status: Secondary | ICD-10-CM

## 2022-08-18 DIAGNOSIS — I700 Atherosclerosis of aorta: Secondary | ICD-10-CM | POA: Diagnosis not present

## 2022-08-18 DIAGNOSIS — E1129 Type 2 diabetes mellitus with other diabetic kidney complication: Secondary | ICD-10-CM | POA: Diagnosis not present

## 2022-08-18 DIAGNOSIS — Z888 Allergy status to other drugs, medicaments and biological substances status: Secondary | ICD-10-CM

## 2022-08-18 DIAGNOSIS — E1165 Type 2 diabetes mellitus with hyperglycemia: Secondary | ICD-10-CM | POA: Diagnosis present

## 2022-08-18 DIAGNOSIS — Z7982 Long term (current) use of aspirin: Secondary | ICD-10-CM

## 2022-08-18 DIAGNOSIS — R0600 Dyspnea, unspecified: Secondary | ICD-10-CM | POA: Diagnosis not present

## 2022-08-18 DIAGNOSIS — I10 Essential (primary) hypertension: Secondary | ICD-10-CM | POA: Diagnosis not present

## 2022-08-18 DIAGNOSIS — E1169 Type 2 diabetes mellitus with other specified complication: Secondary | ICD-10-CM | POA: Diagnosis present

## 2022-08-18 DIAGNOSIS — Z1152 Encounter for screening for COVID-19: Secondary | ICD-10-CM | POA: Diagnosis not present

## 2022-08-18 DIAGNOSIS — N186 End stage renal disease: Secondary | ICD-10-CM

## 2022-08-18 DIAGNOSIS — E1122 Type 2 diabetes mellitus with diabetic chronic kidney disease: Secondary | ICD-10-CM | POA: Diagnosis not present

## 2022-08-18 DIAGNOSIS — R0683 Snoring: Secondary | ICD-10-CM | POA: Diagnosis not present

## 2022-08-18 DIAGNOSIS — I517 Cardiomegaly: Secondary | ICD-10-CM | POA: Diagnosis not present

## 2022-08-18 DIAGNOSIS — Z91158 Patient's noncompliance with renal dialysis for other reason: Secondary | ICD-10-CM

## 2022-08-18 DIAGNOSIS — R140 Abdominal distension (gaseous): Secondary | ICD-10-CM | POA: Diagnosis not present

## 2022-08-18 DIAGNOSIS — R0602 Shortness of breath: Secondary | ICD-10-CM | POA: Diagnosis not present

## 2022-08-18 DIAGNOSIS — Z823 Family history of stroke: Secondary | ICD-10-CM

## 2022-08-18 DIAGNOSIS — Z9842 Cataract extraction status, left eye: Secondary | ICD-10-CM

## 2022-08-18 DIAGNOSIS — I509 Heart failure, unspecified: Secondary | ICD-10-CM | POA: Diagnosis not present

## 2022-08-18 DIAGNOSIS — J811 Chronic pulmonary edema: Secondary | ICD-10-CM | POA: Diagnosis not present

## 2022-08-18 DIAGNOSIS — F4321 Adjustment disorder with depressed mood: Secondary | ICD-10-CM | POA: Diagnosis present

## 2022-08-18 DIAGNOSIS — Z89511 Acquired absence of right leg below knee: Secondary | ICD-10-CM

## 2022-08-18 DIAGNOSIS — E782 Mixed hyperlipidemia: Secondary | ICD-10-CM

## 2022-08-18 DIAGNOSIS — Z992 Dependence on renal dialysis: Secondary | ICD-10-CM

## 2022-08-18 DIAGNOSIS — I5022 Chronic systolic (congestive) heart failure: Secondary | ICD-10-CM | POA: Diagnosis not present

## 2022-08-18 DIAGNOSIS — I5020 Unspecified systolic (congestive) heart failure: Secondary | ICD-10-CM | POA: Diagnosis not present

## 2022-08-18 DIAGNOSIS — E876 Hypokalemia: Secondary | ICD-10-CM | POA: Diagnosis not present

## 2022-08-18 DIAGNOSIS — Z95810 Presence of automatic (implantable) cardiac defibrillator: Secondary | ICD-10-CM | POA: Diagnosis not present

## 2022-08-18 DIAGNOSIS — I2510 Atherosclerotic heart disease of native coronary artery without angina pectoris: Secondary | ICD-10-CM | POA: Diagnosis present

## 2022-08-18 DIAGNOSIS — R531 Weakness: Secondary | ICD-10-CM | POA: Diagnosis not present

## 2022-08-18 DIAGNOSIS — F419 Anxiety disorder, unspecified: Secondary | ICD-10-CM | POA: Diagnosis present

## 2022-08-18 DIAGNOSIS — Z4682 Encounter for fitting and adjustment of non-vascular catheter: Secondary | ICD-10-CM | POA: Diagnosis not present

## 2022-08-18 DIAGNOSIS — N289 Disorder of kidney and ureter, unspecified: Secondary | ICD-10-CM | POA: Diagnosis not present

## 2022-08-18 DIAGNOSIS — Z452 Encounter for adjustment and management of vascular access device: Secondary | ICD-10-CM | POA: Diagnosis not present

## 2022-08-18 LAB — TROPONIN I (HIGH SENSITIVITY)
Troponin I (High Sensitivity): 674 ng/L (ref <18)
Troponin I (High Sensitivity): 702 ng/L (ref <18)

## 2022-08-18 LAB — GLUCOSE, CAPILLARY: Glucose-Capillary: 211 mg/dL — ABNORMAL HIGH (ref 70–99)

## 2022-08-18 LAB — COMPREHENSIVE METABOLIC PANEL
ALT: 21 U/L (ref 0–44)
AST: 25 U/L (ref 15–41)
Albumin: 2.9 g/dL — ABNORMAL LOW (ref 3.5–5.0)
Alkaline Phosphatase: 51 U/L (ref 38–126)
Anion gap: 17 — ABNORMAL HIGH (ref 5–15)
BUN: 66 mg/dL — ABNORMAL HIGH (ref 6–20)
CO2: 20 mmol/L — ABNORMAL LOW (ref 22–32)
Calcium: 7.7 mg/dL — ABNORMAL LOW (ref 8.9–10.3)
Chloride: 97 mmol/L — ABNORMAL LOW (ref 98–111)
Creatinine, Ser: 11.76 mg/dL — ABNORMAL HIGH (ref 0.61–1.24)
GFR, Estimated: 4 mL/min — ABNORMAL LOW (ref >60)
Glucose, Bld: 226 mg/dL — ABNORMAL HIGH (ref 70–99)
Potassium: 5 mmol/L (ref 3.5–5.1)
Sodium: 134 mmol/L — ABNORMAL LOW (ref 135–145)
Total Bilirubin: 0.8 mg/dL (ref 0.3–1.2)
Total Protein: 6.6 g/dL (ref 6.5–8.1)

## 2022-08-18 LAB — CBC WITH DIFFERENTIAL/PLATELET
Abs Immature Granulocytes: 0.09 10*3/uL — ABNORMAL HIGH (ref 0.00–0.07)
Basophils Absolute: 0.1 10*3/uL (ref 0.0–0.1)
Basophils Relative: 0 %
Eosinophils Absolute: 0.1 10*3/uL (ref 0.0–0.5)
Eosinophils Relative: 1 %
HCT: 43.1 % (ref 39.0–52.0)
Hemoglobin: 14.2 g/dL (ref 13.0–17.0)
Immature Granulocytes: 1 %
Lymphocytes Relative: 7 %
Lymphs Abs: 0.9 10*3/uL (ref 0.7–4.0)
MCH: 30.3 pg (ref 26.0–34.0)
MCHC: 32.9 g/dL (ref 30.0–36.0)
MCV: 92.1 fL (ref 80.0–100.0)
Monocytes Absolute: 0.6 10*3/uL (ref 0.1–1.0)
Monocytes Relative: 4 %
Neutro Abs: 12.7 10*3/uL — ABNORMAL HIGH (ref 1.7–7.7)
Neutrophils Relative %: 87 %
Platelets: UNDETERMINED 10*3/uL (ref 150–400)
RBC: 4.68 MIL/uL (ref 4.22–5.81)
RDW: 14.8 % (ref 11.5–15.5)
WBC: 14.5 10*3/uL — ABNORMAL HIGH (ref 4.0–10.5)
nRBC: 0 % (ref 0.0–0.2)

## 2022-08-18 LAB — LACTIC ACID, PLASMA: Lactic Acid, Venous: 1.2 mmol/L (ref 0.5–1.9)

## 2022-08-18 LAB — MAGNESIUM: Magnesium: 1.8 mg/dL (ref 1.7–2.4)

## 2022-08-18 LAB — BRAIN NATRIURETIC PEPTIDE: B Natriuretic Peptide: 1158.6 pg/mL — ABNORMAL HIGH (ref 0.0–100.0)

## 2022-08-18 LAB — HEPATITIS B SURFACE ANTIGEN: Hepatitis B Surface Ag: NONREACTIVE

## 2022-08-18 MED ORDER — ASPIRIN 81 MG PO TBEC
81.0000 mg | DELAYED_RELEASE_TABLET | Freq: Every day | ORAL | Status: DC
  Administered 2022-08-19 – 2022-08-25 (×7): 81 mg via ORAL
  Filled 2022-08-18 (×7): qty 1

## 2022-08-18 MED ORDER — SODIUM CHLORIDE 0.9 % IV SOLN
500.0000 mg | INTRAVENOUS | Status: DC
  Administered 2022-08-19: 500 mg via INTRAVENOUS
  Filled 2022-08-18 (×2): qty 5

## 2022-08-18 MED ORDER — LANTHANUM CARBONATE 500 MG PO CHEW
500.0000 mg | CHEWABLE_TABLET | Freq: Three times a day (TID) | ORAL | Status: DC
  Administered 2022-08-19 – 2022-08-26 (×17): 500 mg via ORAL
  Filled 2022-08-18 (×26): qty 1

## 2022-08-18 MED ORDER — NORTRIPTYLINE HCL 25 MG PO CAPS
25.0000 mg | ORAL_CAPSULE | Freq: Every day | ORAL | Status: DC
  Administered 2022-08-18 – 2022-08-25 (×7): 25 mg via ORAL
  Filled 2022-08-18 (×10): qty 1

## 2022-08-18 MED ORDER — INSULIN ASPART 100 UNIT/ML IJ SOLN
0.0000 [IU] | Freq: Three times a day (TID) | INTRAMUSCULAR | Status: DC
  Administered 2022-08-19 (×2): 3 [IU] via SUBCUTANEOUS
  Administered 2022-08-20 (×2): 2 [IU] via SUBCUTANEOUS
  Administered 2022-08-21 (×3): 3 [IU] via SUBCUTANEOUS
  Administered 2022-08-22: 1 [IU] via SUBCUTANEOUS
  Administered 2022-08-22: 2 [IU] via SUBCUTANEOUS
  Administered 2022-08-22: 3 [IU] via SUBCUTANEOUS
  Administered 2022-08-23: 2 [IU] via SUBCUTANEOUS
  Administered 2022-08-23: 5 [IU] via SUBCUTANEOUS
  Administered 2022-08-23: 1 [IU] via SUBCUTANEOUS
  Administered 2022-08-24: 3 [IU] via SUBCUTANEOUS
  Administered 2022-08-24: 5 [IU] via SUBCUTANEOUS
  Administered 2022-08-24 – 2022-08-25 (×3): 2 [IU] via SUBCUTANEOUS
  Administered 2022-08-26: 5 [IU] via SUBCUTANEOUS
  Administered 2022-08-26: 1 [IU] via SUBCUTANEOUS

## 2022-08-18 MED ORDER — CARVEDILOL 12.5 MG PO TABS
12.5000 mg | ORAL_TABLET | Freq: Two times a day (BID) | ORAL | Status: DC
  Administered 2022-08-19: 12.5 mg via ORAL
  Filled 2022-08-18: qty 1

## 2022-08-18 MED ORDER — GABAPENTIN 300 MG PO CAPS
300.0000 mg | ORAL_CAPSULE | Freq: Two times a day (BID) | ORAL | Status: DC
  Administered 2022-08-19 – 2022-08-26 (×15): 300 mg via ORAL
  Filled 2022-08-18 (×15): qty 1

## 2022-08-18 MED ORDER — SODIUM CHLORIDE 0.9 % IV SOLN
2.0000 g | INTRAVENOUS | Status: DC
  Administered 2022-08-19: 2 g via INTRAVENOUS
  Filled 2022-08-18: qty 20

## 2022-08-18 MED ORDER — HEPARIN SODIUM (PORCINE) 5000 UNIT/ML IJ SOLN
5000.0000 [IU] | Freq: Three times a day (TID) | INTRAMUSCULAR | Status: DC
  Administered 2022-08-18 – 2022-08-24 (×15): 5000 [IU] via SUBCUTANEOUS
  Filled 2022-08-18 (×15): qty 1

## 2022-08-18 MED ORDER — RENA-VITE PO TABS
1.0000 | ORAL_TABLET | Freq: Every day | ORAL | Status: DC
  Administered 2022-08-19 – 2022-08-26 (×8): 1 via ORAL
  Filled 2022-08-18 (×8): qty 1

## 2022-08-18 MED ORDER — CINACALCET HCL 30 MG PO TABS
30.0000 mg | ORAL_TABLET | ORAL | Status: DC
  Administered 2022-08-20 – 2022-08-25 (×3): 30 mg via ORAL
  Filled 2022-08-18 (×4): qty 1

## 2022-08-18 MED ORDER — INSULIN DETEMIR 100 UNIT/ML ~~LOC~~ SOLN
10.0000 [IU] | Freq: Every day | SUBCUTANEOUS | Status: DC
  Administered 2022-08-18: 10 [IU] via SUBCUTANEOUS
  Filled 2022-08-18 (×2): qty 0.1

## 2022-08-18 MED ORDER — CHLORHEXIDINE GLUCONATE CLOTH 2 % EX PADS
6.0000 | MEDICATED_PAD | Freq: Every day | CUTANEOUS | Status: DC
  Administered 2022-08-20 – 2022-08-21 (×2): 6 via TOPICAL

## 2022-08-18 MED ORDER — FENOFIBRATE 160 MG PO TABS
160.0000 mg | ORAL_TABLET | Freq: Every day | ORAL | Status: DC
  Administered 2022-08-19 – 2022-08-26 (×8): 160 mg via ORAL
  Filled 2022-08-18 (×8): qty 1

## 2022-08-18 MED ORDER — ATORVASTATIN CALCIUM 80 MG PO TABS
80.0000 mg | ORAL_TABLET | Freq: Every day | ORAL | Status: DC
  Administered 2022-08-18 – 2022-08-25 (×8): 80 mg via ORAL
  Filled 2022-08-18 (×8): qty 1

## 2022-08-18 NOTE — H&P (Addendum)
History and Physical    Anthony Bullock WNU:[id number] DOB: 03/14/1960 DOA: 08/18/2022  PCP: Kristian Covey, MD   Patient coming from:  ED transfer from [facility]  I have personally briefly reviewed patient's old medical records in [facility]  Chief Complaint:  shortness of breath, cough   HPI: Anthony Bullock is a 62 y.o. male with medical history significant of hypertension, hyperlipidemia, diabetes mellitus type 2, insulin-dependent with peripheral diabetic neuropathy, status post bilateral BKA, end-stage renal disease on hemodialysis TTS, chronic congestive heart failure with preserved ejection fraction (EF 50% in 2021),  anxiety &depression who came to outside ED with complaints of shortness of breath since this morning.  Patient stated that this morning he developed acutely shortness of breath, productive cough of clear sputum, chest congestion. He denied having fever or chills.  He denied having chest pain.  He admitted having dietary indiscretion with meals, not maintaining fluid restriction. When he presented to dialysis unit he was found to be severely dyspneic and hypoxic.  Dialysis was not initiated, last dialysis on 08/16/2022. Per EMS report patient was hypoxic, 70% on room air and was placed on 15 L non-re breather during transport.Anthony Bullock He was brought to ED @ [facility].  ED Course:  Patient presented to outside ED on 08/18/2022. On arrival he was in respiratory distress, placed on nonrebreather mask 15 L/min subsequently ended on BiPAP with FiO2 100% -> 40%. Given suspicion of pneumonia patient was given IV ceftriaxone and azithromycin. Given prior history of CHF, ESRD IV fluids were not administered. During ED stay patient became slightly hypotensive and was placed on low-dose of Levophed, which ultimately was weaned off. ABG showed pH 7.23 with pCO2 49 pO2 49. Chemistry shows sodium 134, potassium 4.3, CO2 24, BUN 55, creatinine 10.5, glucose 196, calcium 8.1, albumin  3.1, total protein 7.6, AST 24, ALT 21, alkaline phosphatase 103. CBC showed WBC 21.9, hemoglobin 14.3, platelet 193. Troponin level was elevated [id number] with delta 428.  Lactic acid 2.1.  NT proBNP 29,111 Patient tested negative for COVID-19, influenza, RSV.  Review of Systems: Review of Systems  Constitutional:  Positive for malaise/fatigue. Negative for chills and fever.  HENT:  Positive for congestion. Negative for ear discharge, hearing loss, nosebleeds and sore throat.   Eyes:  Negative for blurred vision, double vision, pain and discharge.  Respiratory:  Positive for cough, sputum production and shortness of breath. Negative for hemoptysis and wheezing.   Cardiovascular:  Negative for chest pain, palpitations, orthopnea, leg swelling and PND.  Gastrointestinal:  Negative for abdominal pain, constipation, diarrhea, nausea and vomiting.  Genitourinary:  Negative for dysuria, frequency, hematuria and urgency.  Musculoskeletal:  Negative for back pain, joint pain, myalgias and neck pain.  Skin:  Negative for rash.  Neurological:  Negative for dizziness, tingling, sensory change, focal weakness and weakness.  Endo/Heme/Allergies:  Negative for environmental allergies. Does not bruise/bleed easily.  Psychiatric/Behavioral:  Negative for depression, hallucinations and suicidal ideas. The patient is not nervous/anxious.   As per HPI otherwise all other systems reviewed and are negative.  Past Medical History:  Diagnosis Date   AICD (automatic cardioverter/defibrillator) present    [employer]   Allergic rhinitis    Anemia    Arthritis    Chronic systolic heart failure (HCC)    a. ECHO (12/2012) EF 25-30%, HK entireanteroseptal myocardium //  b.  EF 25%, diffuse HK, grade 1 diastolic dysfunction, MAC, mild LAE, normal RVSF, trivial pericardial effusion   COPD (  chronic obstructive pulmonary disease) (HCC)    Diabetes mellitus type II    Diabetic nephropathy (HCC)    Diabetic  neuropathy (HCC)    ESRD on hemodialysis [facility])    started HD June 2017, goes to [facility] HD unit, Dr Fausto Skillern   History of cardiac catheterization    a.Myoview 1/15:  There is significant left ventricular dysfunction. There may be slight scar at the apex. There is no significant ischemia. LV Ejection Fraction: 27%  //  b. RHC/LHC (1/15) with mean RA 6, PA 47/22 mean 33, mean PCWP 20, PVR 2.5 WU, CI 2.5; 80% dLAD stenosis, 70% diffuse large D.     History of kidney stones    Hyperlipidemia    Hypertension    Kidney stones    NICM (nonischemic cardiomyopathy) (HCC)    Primarily nonischemic.  Echo (12/14) with EF 25-30%.  Echo (3/15) with EF 25%, mild to moderately dilated LV, normal RV size and systolic function.     Osteomyelitis (HCC)    left fifth ray   Pneumonia    Urethral stricture    Wears glasses     Past Surgical History:  Procedure Laterality Date   ABDOMINAL AORTOGRAM W/LOWER EXTREMITY N/A 03/30/2016   Procedure: Abdominal Aortogram w/Lower Extremity;  Surgeon: Chuck Hint, MD;  Location: [facility] INVASIVE CV LAB;  Service: Cardiovascular;  Laterality: N/A;   AMPUTATION Right 04/26/2016   Procedure: Right Below Knee Amputation;  Surgeon: Nadara Mustard, MD;  Location: [facility] OR;  Service: Orthopedics;  Laterality: Right;   AMPUTATION Left 08/21/2019   Procedure: LEFT FOOT 5TH RAY AMPUTATION;  Surgeon: Nadara Mustard, MD;  Location: [facility] OR;  Service: Orthopedics;  Laterality: Left;   AMPUTATION Left 11/13/2019   Procedure: LEFT BELOW KNEE AMPUTATION;  Surgeon: Nadara Mustard, MD;  Location: [facility] OR;  Service: Orthopedics;  Laterality: Left;   AV FISTULA PLACEMENT Right 09/08/2015   Procedure: INSERTION OF 4-37mm x 45cm  ARTERIOVENOUS (AV) GORE-TEX GRAFT RIGHT UPPER  ARM;  Surgeon: Chuck Hint, MD;  Location: [facility] OR;  Service: Vascular;  Laterality: Right;   AV FISTULA PLACEMENT Left 01/14/2016   Procedure: CREATION OF LEFT UPPER ARM ARTERIOVENOUS FISTULA;  Surgeon: Chuck Hint, MD;  Location: [facility] OR;  Service: Vascular;  Laterality: Left;   BASCILIC VEIN TRANSPOSITION Right 08/22/2014   Procedure: RIGHT UPPER ARM BASCILIC VEIN TRANSPOSITION;  Surgeon: Chuck Hint, MD;  Location: [facility] OR;  Service: Vascular;  Laterality: Right;   BELOW KNEE LEG AMPUTATION Right 04/26/2016   CARDIAC CATHETERIZATION     CARDIAC DEFIBRILLATOR PLACEMENT  06/27/2013   Sub Q       BY DR Graciela Husbands   CATARACT EXTRACTION W/PHACO Right 08/06/2018   Procedure: CATARACT EXTRACTION PHACO AND INTRAOCULAR LENS PLACEMENT (IOC);  Surgeon: Fabio Pierce, MD;  Location: [facility] ORS;  Service: Ophthalmology;  Laterality: Right;  CDE: 4.06   CATARACT EXTRACTION W/PHACO Left 08/20/2018   Procedure: CATARACT EXTRACTION PHACO AND INTRAOCULAR LENS PLACEMENT (IOC);  Surgeon: Fabio Pierce, MD;  Location: [facility] ORS;  Service: Ophthalmology;  Laterality: Left;  CDE: 6.76   COLONOSCOPY WITH PROPOFOL N/A 07/22/2015   Procedure: COLONOSCOPY WITH PROPOFOL;  Surgeon: Sherrilyn Rist, MD;  Location: [facility] ENDOSCOPY;  Service: Gastroenterology;  Laterality: N/A;   FEMORAL-POPLITEAL BYPASS GRAFT Right 03/31/2016   Procedure: BYPASS GRAFT FEMORAL-POPLITEAL ARTERY USING RIGHT GREATER SAPHENOUS NONREVERSED VEIN;  Surgeon: Chuck Hint, MD;  Location[facility] OR;  Service: Vascular;  Laterality: Right;  HERNIA REPAIR     I & D EXTREMITY Right 03/31/2016   Procedure: IRRIGATION AND DEBRIDEMENT FOOT;  Surgeon: Chuck Hint, MD;  Location: [facility] OR;  Service: Vascular;  Laterality: Right;   IMPLANTABLE CARDIOVERTER DEFIBRILLATOR IMPLANT N/A 06/27/2013   Procedure: SUB Q ICD;  Surgeon: Duke Salvia, MD;  Location: [facility] CATH LAB;  Service: Cardiovascular;  Laterality: N/A;   INTRAOPERATIVE ARTERIOGRAM Right 03/31/2016   Procedure: INTRA OPERATIVE ARTERIOGRAM;  Surgeon: Chuck Hint, MD;  Location: [facility] OR;  Service: Vascular;  Laterality: Right;   IR GENERIC HISTORICAL Right 11/30/2015   IR THROMBECTOMY AV  FISTULA W/THROMBOLYSIS/PTA INC/SHUNT/IMG RIGHT 11/30/2015 Irish Lack, MD [facility]   IR GENERIC HISTORICAL  11/30/2015   IR US GUIDE VASC ACCESS RIGHT 11/30/2015 Irish Lack, MD [facility]   IR GENERIC HISTORICAL Right 12/15/2015   IR THROMBECTOMY AV FISTULA W/THROMBOLYSIS/PTA/STENT INC/SHUNT/IMG RT 12/15/2015 Oley Balm, MD [facility]   IR GENERIC HISTORICAL  12/15/2015   IR US GUIDE VASC ACCESS RIGHT 12/15/2015 Oley Balm, MD [facility]   IR GENERIC HISTORICAL  12/28/2015   IR FLUORO GUIDE CV LINE RIGHT 12/28/2015 Jolaine Click, MD [facility]   IR GENERIC HISTORICAL  12/28/2015   IR US GUIDE VASC ACCESS RIGHT 12/28/2015 Jolaine Click, MD [facility]   LEFT A ND RIGHT HEART CATH  01/30/2013   DR Jones Broom   LEFT AND RIGHT HEART CATHETERIZATION WITH CORONARY ANGIOGRAM N/A 01/30/2013   Procedure: LEFT AND RIGHT HEART CATHETERIZATION WITH CORONARY ANGIOGRAM;  Surgeon: Dolores Patty, MD;  Location: [facility] CATH LAB;  Service: Cardiovascular;  Laterality: N/A;   PERIPHERAL VASCULAR CATHETERIZATION Right 01/26/2015   Procedure: A/V Fistulagram;  Surgeon: Chuck Hint, MD;  Location: [facility] INVASIVE CV LAB;  Service: Cardiovascular;  Laterality: Right;   reapea urethral surgery for recurrent obstruction  2011   TOTAL KNEE ARTHROPLASTY Right 2007   VEIN HARVEST Right 03/31/2016   Procedure: RIGHT GREATER SAPHENOUS VEIN HARVEST;  Surgeon: Chuck Hint, MD;  Location: [facility] OR;  Service: Vascular;  Laterality: Right;    Social History  reports that he quit smoking about 12 years ago. His smoking use included cigarettes. He started smoking about 44 years ago. He has a 64 pack-year smoking history. He has never used smokeless tobacco. He reports that he does not drink alcohol and does not use drugs.  Allergies  Allergen Reactions   Epoetin Alfa Other (See Comments)    unknown   Ferumoxytol Other (See Comments)    unknown   Morphine Sulfate Rash and Other (See  Comments)    Itches all over, red spots    Family History  Problem Relation Age of Onset   Bladder Cancer Mother    Alcohol abuse Father    Melanoma Father    Stroke Maternal Grandmother    Heart Problems Maternal Grandmother        unknown   Diabetes Maternal Grandmother    Heart disease Maternal Grandfather    Prostate cancer Maternal Grandfather    Prior to Admission medications   Medication Sig Start Date End Date Taking? Authorizing Provider  acetaminophen (TYLENOL) 325 MG tablet Take 1-2 tablets (325-650 mg total) by mouth every 4 (four) hours as needed for mild pain. 11/29/19   Love, Evlyn Kanner, PA-C  aspirin EC 81 MG tablet Take 81 mg by mouth daily.     [provider]  atorvastatin (LIPITOR) 80 MG tablet TAKE 1 TABLET BY MOUTH AT BEDTIME 06/24/22  Burchette, Elberta Fortis, MD  azelastine (ASTELIN) 0.1 % nasal spray Place 2 sprays into both nostrils 3 (three) times daily as needed for rhinitis. Use in each nostril as directed 03/10/14   Burchette, Elberta Fortis, MD  benzonatate (TESSALON PERLES) 100 MG capsule 1-2 capsules up to twice daily as needed for cough Patient not taking: Reported on 08/09/2022 02/25/21   Terressa Koyanagi, DO  CALCITRIOL PO Take by mouth 3 (three) times a week. ONLY PROVIDED AT DIALYSIS    [provider]  carvedilol (COREG) 12.5 MG tablet Take 1 tablet by mouth twice daily 06/09/22   Burchette, Elberta Fortis, MD  cinacalcet (SENSIPAR) 30 MG tablet Take 1 tablet (30 mg total) by mouth Every Tuesday,Thursday,and Saturday with dialysis. 11/30/19   Love, Evlyn Kanner, PA-C  Continuous Glucose Receiver (FREESTYLE LIBRE 3 READER) DEVI Use to check blood glucose TID 08/11/22   Burchette, Elberta Fortis, MD  Continuous Glucose Sensor (FREESTYLE LIBRE 14 DAY SENSOR) MISC USE AS DIRECTED EVERY 14 DAYS 05/11/22   Burchette, Elberta Fortis, MD  Continuous Glucose Sensor (FREESTYLE LIBRE 3 SENSOR) MISC Place 1 sensor on the skin every 14 days. Use to check glucose continuously 07/25/22    Burchette, Elberta Fortis, MD  fenofibrate 160 MG tablet TAKE 1 TABLET BY MOUTH ONCE DAILY 06/24/22   Burchette, Elberta Fortis, MD  fexofenadine (ALLEGRA) 180 MG tablet Take 180 mg by mouth daily.    [provider]  furosemide (LASIX) 40 MG tablet Take 3 tablets by mouth once daily 06/13/22   Burchette, Elberta Fortis, MD  gabapentin (NEURONTIN) 300 MG capsule TAKE 2 CAPSULES BY MOUTH TWICE DAILY . 06/24/22   Burchette, Elberta Fortis, MD  glucose blood test strip Check 1 time daily. E11.9 One Touch Ultra Blue Test Strips 03/10/14   Burchette, Elberta Fortis, MD  HUMALOG KWIKPEN 200 UNIT/ML KwikPen INJECT A MAXIMUM OF 28 UNITS SUBCUTANEOUSLY TWICE DAILY WITH LUNCH AND SUPPER PER SLIDING SCALE. APPOINTMENT REQUIRED FOR FUTURE REFILLS 08/15/22   Burchette, Elberta Fortis, MD  Insulin Pen Needle (BD PEN NEEDLE NANO U/F) 32G X 4 MM MISC USE 1 PEN NEEDLE SUBCUTANEOUSLY WITH INSULIN 4 TIMES DAILY 12/04/19   Burchette, Elberta Fortis, MD  lanthanum (FOSRENOL) 500 MG chewable tablet Chew 500-1,000 mg by mouth See admin instructions. Take 1000 mg with meals three time a day and 500 mg with snacks    [provider]  LANTUS SOLOSTAR 100 UNIT/ML Solostar Pen INJECT 12 UNITS SUBCUTANEOUSLY AT BEDTIME 07/15/22   Burchette, Elberta Fortis, MD  midodrine (PROAMATINE) 10 MG tablet Take 10 mg by mouth 3 (three) times a week. 08/04/20   [provider]  multivitamin (RENA-VIT) TABS tablet Take 1 tablet by mouth once daily 07/06/21   Burchette, Elberta Fortis, MD  nortriptyline (PAMELOR) 25 MG capsule Take 1 capsule (25 mg total) by mouth at bedtime. 08/09/22   Sater, Pearletha Furl, MD  Olopatadine HCl 0.2 % SOLN Place 1 drop into both eyes daily as needed (for allergies).     [provider]  ondansetron (ZOFRAN ODT) 8 MG disintegrating tablet Take 1 tablet (8 mg total) by mouth every 8 (eight) hours as needed for nausea or vomiting. Patient not taking: Reported on 08/09/2022 02/17/21   Kristian Covey, MD  VENTOLIN HFA 108 [phone] Base) MCG/ACT inhaler Inhale  1-2 puffs into the lungs every 6 (six) hours as needed for wheezing or shortness of breath. 07/25/22   Burchette, Elberta Fortis, MD    Physical Exam: Vitals:  08/18/22 2014  BP: 139/71  Pulse: (!) 101  Resp: 20  Temp: 97.8 F (36.6 C)  TempSrc: Oral  SpO2: 97%    Constitutional: NAD, calm, comfortable.  On BiPAP Vitals:   08/18/22 2014  BP: 139/71  Pulse: (!) 101  Resp: 20  Temp: 97.8 F (36.6 C)  TempSrc: Oral  SpO2: 97%   Eyes: PERRL, lids and conjunctivae normal ENMT: Mucous membranes are moist. Posterior pharynx clear of any exudate or lesions.Normal dentition.  Neck: normal, supple, no masses, no thyromegaly Respiratory: .  Bilateral scattered rhonchi, normal respiratory effort. No accessory muscle use.  Cardiovascular: Regular rate and rhythm, no murmurs / rubs / gallops. No carotid bruits.  Abdomen: no tenderness, no masses palpated. No hepatosplenomegaly. Bowel sounds positive.  Musculoskeletal: no clubbing / cyanosis. No joint deformity upper extremities. Good ROM, no contractures. Normal muscle tone.  S/p bilateral BKA Skin: no rashes, lesions, ulcers. No induration.  Right anterior chest wall dialysis catheter Neurologic: CN 2-12 grossly intact. Sensation intact, DTR normal. Strength 5/5 in all 4.  Psychiatric: Normal judgment and insight. Alert and oriented x 3. Normal mood.   Labs on Admission: I have personally reviewed following labs and imaging studies  CBC:  No results for input(s): "WBC", "NEUTROABS", "HGB", "HCT", "MCV", "PLT" in the last 168 hours.  Basic Metabolic Panel: No results for input(s): "NA", "K", "CL", "CO2", "GLUCOSE", "BUN", "CREATININE", "CALCIUM", "MG", "PHOS" in the last 168 hours.  GFR: Estimated Creatinine Clearance: 8.9 mL/min (A) (by C-G formula based on SCr of 10.46 mg/dL (H)).  Liver Function Tests: No results for input(s): "AST", "ALT", "ALKPHOS", "BILITOT", "PROT", "ALBUMIN" in the last 168 hours.  Urine analysis:     Component Value Date/Time   COLORURINE yellow 09/28/2009 1317   APPEARANCEUR Clear 09/28/2009 1317   LABSPEC 1.010 09/28/2009 1317   PHURINE 5.0 09/28/2009 1317   HGBUR 2+ 09/28/2009 1317   BILIRUBINUR negative 03/17/2016 1648   PROTEINUR trace 03/17/2016 1648   UROBILINOGEN 0.2 03/17/2016 1648   UROBILINOGEN 0.2 09/28/2009 1317   NITRITE negative 03/17/2016 1648   NITRITE negative 09/28/2009 1317   LEUKOCYTESUR small (1+) (A) 03/17/2016 1648   Radiological Exams on Admission:  CXR 8/8 Stable lung volumes and mediastinal contours.  Right chest dual-lumen dialysis catheter and  the left chest AICD which is entirely subcutaneous appears stable.  Similar but increased basilar predominant bilateral pulmonary interstitial opacity.  Probable small pleural effusions.  Increased upper lung vascularity.  No pneumothorax or consolidation.  No acute osseous abnormality.  EKG: Independently reviewed.  EKG revealed sinus tachycardia at 104/min with left axis deviation, nonspecific intraventricular block, T wave abnormalities in lateral leads  Assessment/Plan Principal Problem:   Pneumonia due to infectious organism Active Problems:   Poorly controlled type II diabetes mellitus with renal complication (HCC)   Hyperlipidemia   Adjustment disorder with depressed mood   Essential hypertension   COPD (chronic obstructive pulmonary disease) (HCC)   Chronic systolic congestive heart failure (HCC)   CAD (coronary artery disease)   Acute respiratory failure with hypoxia (HCC)   ESRD on dialysis (HCC)   S/P bilateral below knee amputation (HCC)   Acute pulmonary edema (HCC)   Presence of automatic (implantable) cardiac defibrillator   Acute hypoxic respiratory failure Acute pulmonary edema End-stage renal disease on hemodialysis TTS Suspect some evidence of volume overload, as patient admits dietary and fluid indiscretion. He missed dialysis this morning, and last dialysis was on  08/16/2022. Patient continued on BiPAP, without discomfort.  Appears stable hemodynamically. Plan. Obtain urgent nephrology consult for inpatient dialysis.   Continue BiPAP, hopefully he can be weaned off BiPAP after dialysis.  Acute on chronic congestive heart failure with preserved ejection fraction (EF 50% in 2021) Status post AICD  Acute pulmonary edema Patient has evidence of acute CHF exacerbation, with evidence of acute pulmonary edema. Plan as above, continue BiPAP, request urgent dialysis with efforts to remove excessive volume. Update 2D echocardiogram.  Pneumonia due to infectious organism Suspect patient may develop pneumonia, with worsening respiratory status, leukocytosis. Plan. Continue IV ceftriaxone, azithromycin, with plan to complete 5 days of therapy. Check blood cultures, lactic acid.  Coronary artery disease Elevation of troponin Since patient has no acute chest pain and EKG did not show acute ST elevation, suspect elevation of troponin is due to demand ischemia from hypoxia, hypotension. Continue aspirin, Coreg, Lipitor  Hypertension Continue carvedilol. Patient has history of hypotension with dialysis, and using midodrine during dialysis days.  Hyperlipidemia Continue lipid-lowering therapy with atorvastatin, fenofibrate.  Diabetes mellitus type 2, insulin-dependent with peripheral diabetic neuropathy Status post bilateral BKA Patient will be placed on nightly Levemir along with sliding scale coverage with insulin NovoLog Continue to monitor blood glucose.   Continue gabapentin for diabetic neuropathy.  Anxiety &depression  Mood is stable.  Continue nightly nortriptyline.  DVT prophylaxis: Heparin Code Status:        Full code Family Communication:  None   Disposition Plan:   Patient is from:  Home  Anticipated DC to:  Home  Anticipated DC date:  72 hrs   Anticipated DC barriers: None   Consults called:  Nephrology (Dr Allena Katz)  Admission status:   Inpatient. Progressive Care Unit    Severity of Illness:  The appropriate patient status for this patient is INPATIENT. Inpatient status is judged to be reasonable and necessary in order to provide the required intensity of service to ensure the patient's safety. The patient's presenting symptoms, physical exam findings, and initial radiographic and laboratory data in the context of their chronic comorbidities is felt to place them at high risk for further clinical deterioration. Furthermore, it is not anticipated that the patient will be medically stable for discharge from the hospital within 2 midnights of admission.   * I certify that at the point of admission it is my clinical judgment that the patient will require inpatient hospital care spanning beyond 2 midnights from the point of admission due to high intensity of service, high risk for further deterioration and high frequency of surveillance required.Youlanda Roys MD [facility]  How to contact the [facility] Attending or Consulting provider 7A - 7P or covering provider during after hours 7P -7A, for this patient?   Check the care team in [facility] and look for a) attending/consulting [facility] provider listed and b) the [facility] team listed Log into [email or web address] and use [place]'s universal password to access. If you do not have the password, please contact the hospital operator. Locate the [facility] provider you are looking for under [facility] and page to a number that you can be directly reached. If you still have difficulty reaching the provider, please page the [facility] (Director on Call) for the Hospitalists listed on amion for assistance.  08/18/2022, 9:54 PM

## 2022-08-18 NOTE — Consult Note (Signed)
Reason for Consult: Volume overload/acute hypoxic respiratory failure in patient with ESRD Referring Physician: Madelyn Flavors, MD [facility])  HPI: 62 year old man with past medical history significant for diabetes mellitus, hypotension on midodrine, COPD, CHFrEF with nonischemic cardiomyopathy status post ICD placement, peripheral vascular disease status post bilateral BKA and end-stage renal disease on hemodialysis.  Reports that he went to dialysis earlier today for his scheduled treatment but was directed to the emergency room [facility]) with concerns of hypoxic respiratory failure/shortness of breath and cough.  He admits that he "overdid it since his last dialysis" with dietary and fluid indiscretions.  He denies any fever or chills and has not had any hemoptysis.  He denies any nausea, vomiting or diarrhea.  He has empirically been started on treatment for community-acquired pneumonia.  About 2 weeks ago he was seen in the emergency room for right-sided headache and diplopia as well as dizziness for which he had a CT angiogram that was negative for large vessel occlusion but showed severe stenosis of both cavernous internal carotid artery secondary to calcific atherosclerosis as well as moderate narrowing of the left vertebral artery origin.  Hemodialysis prescription: [facility] TTS, EDW 91.5 kg  Past Medical History:  Diagnosis Date   AICD (automatic cardioverter/defibrillator) present    [employer]   Allergic rhinitis    Anemia    Arthritis    Chronic systolic heart failure (HCC)    a. ECHO (12/2012) EF 25-30%, HK entireanteroseptal myocardium //  b.  EF 25%, diffuse HK, grade 1 diastolic dysfunction, MAC, mild LAE, normal RVSF, trivial pericardial effusion   COPD (chronic obstructive pulmonary disease) (HCC)    Diabetes mellitus type II    Diabetic nephropathy (HCC)    Diabetic neuropathy (HCC)    ESRD on hemodialysis (HCC)    started HD June 2017, goes to [facility] HD unit, Dr Fausto Skillern   History of cardiac catheterization    a.Myoview 1/15:  There is significant left ventricular dysfunction. There may be slight scar at the apex. There is no significant ischemia. LV Ejection Fraction: 27%  //  b. RHC/LHC (1/15) with mean RA 6, PA 47/22 mean 33, mean PCWP 20, PVR 2.5 WU, CI 2.5; 80% dLAD stenosis, 70% diffuse large D.     History of kidney stones    Hyperlipidemia    Hypertension    Kidney stones    NICM (nonischemic cardiomyopathy) (HCC)    Primarily nonischemic.  Echo (12/14) with EF 25-30%.  Echo (3/15) with EF 25%, mild to moderately dilated LV, normal RV size and systolic function.     Osteomyelitis (HCC)    left fifth ray   Pneumonia    Urethral stricture    Wears glasses     Past Surgical History:  Procedure Laterality Date   ABDOMINAL AORTOGRAM W/LOWER EXTREMITY N/A 03/30/2016   Procedure: Abdominal Aortogram w/Lower Extremity;  Surgeon: Chuck Hint, MD;  Location: [facility] INVASIVE CV LAB;  Service: Cardiovascular;  Laterality: N/A;   AMPUTATION Right 04/26/2016   Procedure: Right Below Knee Amputation;  Surgeon: Nadara Mustard, MD;  Location: [facility] OR;  Service: Orthopedics;  Laterality: Right;   AMPUTATION Left 08/21/2019   Procedure: LEFT FOOT 5TH RAY AMPUTATION;  Surgeon: Nadara Mustard, MD;  Location: [facility] OR;  Service: Orthopedics;  Laterality: Left;   AMPUTATION Left 11/13/2019   Procedure: LEFT BELOW KNEE AMPUTATION;  Surgeon: Nadara Mustard, MD;  Location: [facility] OR;  Service: Orthopedics;  Laterality: Left;  AV FISTULA PLACEMENT Right 09/08/2015   Procedure: INSERTION OF 4-98mm x 45cm  ARTERIOVENOUS (AV) GORE-TEX GRAFT RIGHT UPPER  ARM;  Surgeon: Chuck Hint, MD;  Location: [facility] OR;  Service: Vascular;  Laterality: Right;   AV FISTULA PLACEMENT Left 01/14/2016   Procedure: CREATION OF LEFT UPPER ARM ARTERIOVENOUS FISTULA;  Surgeon: Chuck Hint, MD;  Location: [facility] OR;  Service: Vascular;  Laterality: Left;   BASCILIC VEIN  TRANSPOSITION Right 08/22/2014   Procedure: RIGHT UPPER ARM BASCILIC VEIN TRANSPOSITION;  Surgeon: Chuck Hint, MD;  Location: [facility] OR;  Service: Vascular;  Laterality: Right;   BELOW KNEE LEG AMPUTATION Right 04/26/2016   CARDIAC CATHETERIZATION     CARDIAC DEFIBRILLATOR PLACEMENT  06/27/2013   Sub Q       BY DR Graciela Husbands   CATARACT EXTRACTION W/PHACO Right 08/06/2018   Procedure: CATARACT EXTRACTION PHACO AND INTRAOCULAR LENS PLACEMENT (IOC);  Surgeon: Fabio Pierce, MD;  Location: [facility] ORS;  Service: Ophthalmology;  Laterality: Right;  CDE: 4.06   CATARACT EXTRACTION W/PHACO Left 08/20/2018   Procedure: CATARACT EXTRACTION PHACO AND INTRAOCULAR LENS PLACEMENT (IOC);  Surgeon: Fabio Pierce, MD;  Location: [facility] ORS;  Service: Ophthalmology;  Laterality: Left;  CDE: 6.76   COLONOSCOPY WITH PROPOFOL N/A 07/22/2015   Procedure: COLONOSCOPY WITH PROPOFOL;  Surgeon: Sherrilyn Rist, MD;  Location: [facility] ENDOSCOPY;  Service: Gastroenterology;  Laterality: N/A;   FEMORAL-POPLITEAL BYPASS GRAFT Right 03/31/2016   Procedure: BYPASS GRAFT FEMORAL-POPLITEAL ARTERY USING RIGHT GREATER SAPHENOUS NONREVERSED VEIN;  Surgeon: Chuck Hint, MD;  Location: [facility] OR;  Service: Vascular;  Laterality: Right;   HERNIA REPAIR     I & D EXTREMITY Right 03/31/2016   Procedure: IRRIGATION AND DEBRIDEMENT FOOT;  Surgeon: Chuck Hint, MD;  Location: [facility] OR;  Service: Vascular;  Laterality: Right;   IMPLANTABLE CARDIOVERTER DEFIBRILLATOR IMPLANT N/A 06/27/2013   Procedure: SUB Q ICD;  Surgeon: Duke Salvia, MD;  Location: [facility] CATH LAB;  Service: Cardiovascular;  Laterality: N/A;   INTRAOPERATIVE ARTERIOGRAM Right 03/31/2016   Procedure: INTRA OPERATIVE ARTERIOGRAM;  Surgeon: Chuck Hint, MD;  Location: [facility] OR;  Service: Vascular;  Laterality: Right;   IR GENERIC HISTORICAL Right 11/30/2015   IR THROMBECTOMY AV FISTULA W/THROMBOLYSIS/PTA INC/SHUNT/IMG RIGHT 11/30/2015 Irish Lack, MD [facility]    IR GENERIC HISTORICAL  11/30/2015   IR US GUIDE VASC ACCESS RIGHT 11/30/2015 Irish Lack, MD [facility]   IR GENERIC HISTORICAL Right 12/15/2015   IR THROMBECTOMY AV FISTULA W/THROMBOLYSIS/PTA/STENT INC/SHUNT/IMG RT 12/15/2015 Oley Balm, MD [facility]   IR GENERIC HISTORICAL  12/15/2015   IR US GUIDE VASC ACCESS RIGHT 12/15/2015 Oley Balm, MD [facility]   IR GENERIC HISTORICAL  12/28/2015   IR FLUORO GUIDE CV LINE RIGHT 12/28/2015 Jolaine Click, MD [facility]   IR GENERIC HISTORICAL  12/28/2015   IR US GUIDE VASC ACCESS RIGHT 12/28/2015 Jolaine Click, MD [facility]   LEFT A ND RIGHT HEART CATH  01/30/2013   DR Jones Broom   LEFT AND RIGHT HEART CATHETERIZATION WITH CORONARY ANGIOGRAM N/A 01/30/2013   Procedure: LEFT AND RIGHT HEART CATHETERIZATION WITH CORONARY ANGIOGRAM;  Surgeon: Dolores Patty, MD;  Location: [facility] CATH LAB;  Service: Cardiovascular;  Laterality: N/A;   PERIPHERAL VASCULAR CATHETERIZATION Right 01/26/2015   Procedure: A/V Fistulagram;  Surgeon: Chuck Hint, MD;  Location: [facility] INVASIVE CV LAB;  Service: Cardiovascular;  Laterality: Right;   reapea urethral surgery for recurrent obstruction  2011   TOTAL KNEE ARTHROPLASTY  Right 2007   VEIN HARVEST Right 03/31/2016   Procedure: RIGHT GREATER SAPHENOUS VEIN HARVEST;  Surgeon: Chuck Hint, MD;  Location: [facility] OR;  Service: Vascular;  Laterality: Right;    Family History  Problem Relation Age of Onset   Bladder Cancer Mother    Alcohol abuse Father    Melanoma Father    Stroke Maternal Grandmother    Heart Problems Maternal Grandmother        unknown   Diabetes Maternal Grandmother    Heart disease Maternal Grandfather    Prostate cancer Maternal Grandfather     Social History:  reports that he quit smoking about 12 years ago. His smoking use included cigarettes. He started smoking about 44 years ago. He has a 64 pack-year smoking history. He has never used smokeless tobacco. He reports  that he does not drink alcohol and does not use drugs.  Allergies:  Allergies  Allergen Reactions   Epoetin Alfa Other (See Comments)    unknown   Ferumoxytol Other (See Comments)    unknown   Morphine Sulfate Rash and Other (See Comments)    Itches all over, red spots    Medications: I have reviewed the patient's current medications. Scheduled:  [START ON 08/19/2022] Chlorhexidine Gluconate Cloth  6 each Topical Q0600   Continuous:  No results found. However, due to the size of the patient record, not all encounters were searched. Please check Results Review for a complete set of results.  No results found.  Review of Systems  Constitutional:  Positive for fatigue. Negative for appetite change, chills and fever.  HENT:  Negative for nosebleeds, sinus pressure, sore throat and trouble swallowing.   Eyes:  Positive for visual disturbance. Negative for pain and redness.  Respiratory:  Positive for cough, chest tightness and shortness of breath.   Cardiovascular:  Negative for chest pain and leg swelling.  Gastrointestinal:  Negative for abdominal distention, abdominal pain, constipation, diarrhea and nausea.  Musculoskeletal:  Negative for arthralgias, back pain, myalgias and neck pain.  Skin:  Negative for rash and wound.  Neurological:  Positive for dizziness, light-headedness and headaches.   Blood pressure 139/71, pulse (!) 101, temperature 97.8 F (36.6 C), temperature source Oral, resp. rate 20, SpO2 97%. Physical Exam Vitals and nursing note reviewed.  Constitutional:      Appearance: He is normal weight. He is ill-appearing.  HENT:     Head: Atraumatic.     Nose:     Comments: On BiPAP Eyes:     General: No scleral icterus.    Extraocular Movements: Extraocular movements intact.  Cardiovascular:     Rate and Rhythm: Tachycardia present. Rhythm irregular.     Pulses: Normal pulses.     Heart sounds: Normal heart sounds.     Comments: Right IJ TDC Pulmonary:      Effort: Pulmonary effort is normal.     Breath sounds: Wheezing and rales present.     Comments: Fine rales with expiratory wheeze Abdominal:     General: Abdomen is flat. Bowel sounds are normal.     Palpations: Abdomen is soft.     Tenderness: There is no abdominal tenderness. There is no guarding.  Musculoskeletal:     Cervical back: Normal range of motion and neck supple.     Comments: Bilateral BKA prosthesis.  Right upper arm AV graft pulsatile  Skin:    General: Skin is warm and dry.  Neurological:     Mental Status: He is  oriented to person, place, and time.     Assessment/Plan: 1.  Acute hypoxic respiratory failure: Possibly multifactorial but likely from volume overload in this patient with COPD +/- exacerbation from community-acquired pneumonia.  Started on treatment for the latter and we will order for emergent hemodialysis overnight with aggressive ultrafiltration for volume unloading. 2.  End-stage renal disease: Emergent hemodialysis overnight for volume management, will order a 2 potassium bath while awaiting labs.  He reports that his right upper arm AV graft is nonfunctional and he has been using his right IJ TDC at dialysis. 3.  Hypotension: Blood pressure currently normal likely secondary to volume excess, monitor with ultrafiltration/hemodialysis. 4.  Anemia of chronic disease: Labs pending at this time 5.  Secondary hyperparathyroidism: Labs pending at this time.  Continue n.p.o. status until liberated from BiPAP.  Dagoberto Ligas 08/18/2022, 8:48 PM

## 2022-08-18 NOTE — Plan of Care (Signed)
Transfer requested from [facility] to [facility] Anthony Bullock is a 62 year old with pmh ESRD on HD(TTS) who does not make urine, hypotension on midodrine, HLD, CAD, systolic CHF, and DM type II who presented with complaints of cough and shortness of breath.  Patient had gone to dialysis, but wasn't dialyzed and sent to the emergency department due to respiratory distress.  Last dialysis 8/6.  Noted to have O2 sats in the 70s.  Initial ABG noted pH 7.23, pCO2 49.1, pO2 49.  Patient had been placed on BiPAP with improvement in respiratory rate.  Labs significant for WBC 21.9, proBNP 29,111, high-sensitivity troponin 81, lactic acid 2.1, sodium 134, potassium 4.3, BUN 55, and creatinine 10.56.  Patient was noted to be afebrile, but had been empirically placed on Rocephin and azithromycin.  Initial blood pressures were reported at 108/50, but initially documented at 101/30.  Case been discussed with Dr. Kathrene Bongo of nephrology who is aware of patient needing dialysis and agreed with transfer to a progressive bed.  Will need to notify nephrology as soon as patient's arrival.

## 2022-08-19 ENCOUNTER — Inpatient Hospital Stay (HOSPITAL_COMMUNITY): Payer: Medicare Other

## 2022-08-19 DIAGNOSIS — E1169 Type 2 diabetes mellitus with other specified complication: Secondary | ICD-10-CM

## 2022-08-19 DIAGNOSIS — E785 Hyperlipidemia, unspecified: Secondary | ICD-10-CM

## 2022-08-19 DIAGNOSIS — J189 Pneumonia, unspecified organism: Secondary | ICD-10-CM | POA: Diagnosis not present

## 2022-08-19 DIAGNOSIS — J449 Chronic obstructive pulmonary disease, unspecified: Secondary | ICD-10-CM

## 2022-08-19 DIAGNOSIS — I5022 Chronic systolic (congestive) heart failure: Secondary | ICD-10-CM | POA: Diagnosis not present

## 2022-08-19 DIAGNOSIS — N186 End stage renal disease: Secondary | ICD-10-CM | POA: Diagnosis not present

## 2022-08-19 DIAGNOSIS — I10 Essential (primary) hypertension: Secondary | ICD-10-CM | POA: Diagnosis not present

## 2022-08-19 LAB — RESPIRATORY PANEL BY PCR

## 2022-08-19 LAB — GLUCOSE, CAPILLARY
Glucose-Capillary: 118 mg/dL — ABNORMAL HIGH (ref 70–99)
Glucose-Capillary: 207 mg/dL — ABNORMAL HIGH (ref 70–99)
Glucose-Capillary: 204 mg/dL — ABNORMAL HIGH (ref 70–99)
Glucose-Capillary: 287 mg/dL — ABNORMAL HIGH (ref 70–99)

## 2022-08-19 LAB — SARS CORONAVIRUS 2 BY RT PCR: SARS Coronavirus 2 by RT PCR: NEGATIVE

## 2022-08-19 MED ORDER — AZITHROMYCIN 500 MG PO TABS: 500.0000 mg | ORAL_TABLET | Freq: Every day | ORAL | Status: DC

## 2022-08-19 MED ORDER — HEPARIN SODIUM (PORCINE) 1000 UNIT/ML DIALYSIS
1000.0000 [IU] | INTRAMUSCULAR | Status: DC | PRN
  Filled 2022-08-19: qty 1

## 2022-08-19 MED ORDER — CARVEDILOL 12.5 MG PO TABS
12.5000 mg | ORAL_TABLET | Freq: Two times a day (BID) | ORAL | Status: DC
  Administered 2022-08-19 – 2022-08-22 (×6): 12.5 mg via ORAL
  Filled 2022-08-19 (×6): qty 1

## 2022-08-19 MED ORDER — ANTICOAGULANT SODIUM CITRATE 4% (200MG/5ML) IV SOLN: 5.0000 mL | Status: DC | PRN

## 2022-08-19 MED ORDER — MIDODRINE HCL 5 MG PO TABS
10.0000 mg | ORAL_TABLET | ORAL | Status: DC
  Administered 2022-08-20 – 2022-08-23 (×3): 10 mg via ORAL
  Filled 2022-08-19 (×4): qty 2

## 2022-08-19 MED ORDER — HEPARIN SODIUM (PORCINE) 1000 UNIT/ML DIALYSIS: 40.0000 [IU]/kg | INTRAMUSCULAR | Status: DC | PRN

## 2022-08-19 MED ORDER — INSULIN GLARGINE-YFGN 100 UNIT/ML ~~LOC~~ SOLN
12.0000 [IU] | Freq: Every day | SUBCUTANEOUS | Status: DC
  Administered 2022-08-19 – 2022-08-25 (×7): 12 [IU] via SUBCUTANEOUS
  Filled 2022-08-19 (×8): qty 0.12

## 2022-08-19 MED ORDER — MIDODRINE HCL 5 MG PO TABS: 10.0000 mg | ORAL_TABLET | ORAL | Status: DC | PRN

## 2022-08-19 MED ORDER — ALTEPLASE 2 MG IJ SOLR: 2.0000 mg | Freq: Once | INTRAMUSCULAR | Status: DC | PRN

## 2022-08-19 MED ORDER — AMLODIPINE BESYLATE 10 MG PO TABS: 10.0000 mg | ORAL_TABLET | Freq: Every day | ORAL | Status: DC

## 2022-08-19 NOTE — Assessment & Plan Note (Signed)
Echocardiogram from 2021 with preserved LV systolic function EF 50%, moderate LVH, RV systolic function preserved, LA with mild dilatation, no significant valvular disease.   Volume status has improved with ultrafiltration. Continue carvedilol.  Blood pressure support with midodrine on HD days.

## 2022-08-19 NOTE — Progress Notes (Signed)
Pt transported to Dialysis at this time on BiPAP with RN.

## 2022-08-19 NOTE — TOC CM/SW Note (Signed)
Transition of Care [facility]) - Inpatient Brief Assessment   Patient Details  Name: FIDENCIO ROGUS MRN: [id number] Date of Birth: 25-Aug-1960  Transition of [facility]) CM/SW Contact:    Darrold Span, RN Phone Number: 08/19/2022, 2:45 PM   Clinical Narrative: From home w/ wife, HD TTS, We will continue to monitor patient advancement through interdisciplinary progression rounds. If new patient transition needs arise, please place a TOC consult.   Transition of Care Asessment: Insurance and Status: Insurance coverage has been reviewed Patient has primary care physician: Yes Home environment has been reviewed: home w/ spouse Prior level of function:: self/independent Prior/Current Home Services: No current home services Social Determinants of Health Reivew: SDOH reviewed no interventions necessary Readmission risk has been reviewed: Yes Transition of care needs: no transition of care needs at this time

## 2022-08-19 NOTE — Hospital Course (Addendum)
[name] was admitted to the hospital with the working diagnosis of volume overload.   62 yo male with the past medical history of hypertension, hyperlipidemia, T2DM, bilateral BKA, and ESRD on HD who presented with dyspnea and cough. Patient developed acute dyspnea and cough. He went to his outpatient HD unit, [facility]) he was found hypoxic, 02 saturation 70% on room air. He was placed on supplemental 02 per non re-brether 15L and was transferred to the ED. He reports not being compliant with salt or fluid restrictions at home.  In the ED his blood pressure was 139/71, HR 101, RR 20 and 02 saturation 97%, lungs with bilateral rhonchi, with no wheezing, heart with S1 and S2 present and regular with no gallops, abdomen with no distention, bilateral BKA.   Na 134, K 5,0 Cl 97, bicarbonate 20, glucose 226 bun 66 cr 11,7 Anion gap 17  BNP 1,158  High sensitive troponin 674 and 702  Lactic acid 1,2 and 2,9  Wbc 14,5 hgb 14.2   Chest radiograph from [facility] with reported pulmonary edema and small pleural effusions.   EKG 104 bpm, left axis deviation, left bundle branch block, sinus rhythm with left atrial enlargement, no significant ST segment or T wave changes.   08/08 Nephrology was consulted and recommendations for emergent hemodialysis.  08/12 had hemodialysis with ultrafiltration on consecutive days. 08/13 patient developed atrial fibrillation during HD.

## 2022-08-19 NOTE — Assessment & Plan Note (Signed)
Uncontrolled with hyperglycemia.   Continue insulin sliding scale for glucose cover and monitoring. Resume home regimen of basal insulin 12 units Patient is tolerating po well.

## 2022-08-19 NOTE — Assessment & Plan Note (Addendum)
Volume overload. Hyponatremia.   Acute hypoxemic respiratory failure.  Follow up chest radiograph with improvement in infiltrates but continue to have features of pulmonary edema.   Dyspnea and 02 requirements improving, today is on 1 L/min per [place] with 02 saturation 91%.   No further hemoptysis.   Na 132, K 3.6 and serum bicarbonate at 25  HD today, with 1,100 ml ultrafiltration,   Anemia of chronic renal disease.  Hgb has been stable.   Metabolic bone disease, continue with cinacalcet.

## 2022-08-19 NOTE — Progress Notes (Signed)
Pt receives out-pt HD at [facility] on TTS. Contacted clinic to make them aware that pt may d/c this weekend and resume on Tuesday per renal NP. Will f/u with clinic on Monday regarding pt's d/c date. Will assist as needed.   Olivia Canter Renal Navigator [phone]

## 2022-08-19 NOTE — Progress Notes (Signed)
Progress Note   Patient: Anthony Bullock:[id number] DOB: Sep 03, 1960 DOA: 08/18/2022     1 DOS: the patient was seen and examined on 08/19/2022   Brief hospital course: Anthony Bullock was admitted to the hospital with the working diagnosis of volume overload.   62 yo male with the past medical history of hypertension, hyperlipidemia, T2DM, bilateral BKA, and ESRD on HD who presented with dyspnea and cough. Patient developed acute dyspnea and cough. He went to his outpatient HD unit, [facility] Bull[facility]) he was found hypoxic, 02 saturation 70% on room air. He was placed on supplemental 02 per non re-brether 15L and was transferred to the ED. He reports not being compliant with salt or fluid restrictions at home.  In the ED his blood pressure was 139/71, HR 101, RR 20 and 02 saturation 97%, lungs with bilateral rhonchi, with no wheezing, heart with S1 and S2 present and regular with no gallops, abdomen with no distention, bilateral BKA.   Na 134, K 5,0 Cl 97, bicarbonate 20, glucose 226 bun 66 cr 11,7 Anion gap 17  BNP 1,158  High sensitive troponin 674 and 702  Lactic acid 1,2 and 2,9  Wbc 14,5 hgb 14.2   Chest radiograph from [facility] with reported pulmonary edema and small pleural effusions.   EKG 104 bpm, left axis deviation, left bundle branch block, sinus rhythm with left atrial enlargement, no significant ST segment or T wave changes.   08/08 Nephrology was consulted and recommendations for emergent hemodialysis.      Assessment and Plan: * ESRD on dialysis (HCC) Volume overload.  Acute hypoxemic respiratory failure.  Patient with improvement of his symptoms after ultrafiltration.  Plan to continue oxymetry monitoring and supplemental 02 per [place] to keep 02 saturation 92% or greater.  Check chest radiograph.   Anemia of chronic renal disease.  Hgb has been stable.   Metabolic bone disease, continue with cinacalcet.   Chronic systolic congestive heart failure (HCC) Echocardiogram from  2021 with preserved LV systolic function EF 50%, moderate LVH, RV systolic function preserved, LA with mild dilatation, no significant valvular disease.   Volume status has improved with ultrafiltration. Continue carvedilol.  Blood pressure support with midodrine on HD days.     Pneumonia due to infectious organism Doubt pneumonia.  Patient with no fever.  Wbc mild elevation,  Plan to check chest radiograph and wbc in am. Will hold on antibiotic therapy for now, pre test probability for pneumonia is low.   Essential hypertension Continue blood pressure control with carvedilol On HD he has been on midodrine.   Type 2 diabetes mellitus with hyperlipidemia (HCC) Uncontrolled with hyperglycemia.   Continue insulin sliding scale for glucose cover and monitoring. Resume home regimen of basal insulin 12 units Patient is tolerating po well.   COPD (chronic obstructive pulmonary disease) (HCC) No signs of acute exacerbation,   Adjustment disorder with depressed mood Continue with nortriptyline.   CAD (coronary artery disease) No acute coronary syndrome. Continue blood pressure control.   S/P bilateral below knee amputation (HCC) He has prosthesis for ambulation.         Subjective: Patient is feeling better after HD but not yet back to baseline, no chest pain or cough.   Physical Exam: Vitals:   08/19/22 0724 08/19/22 0800 08/19/22 0803 08/19/22 1214  BP:  (!) 101/22 (!) 129/55 (!) 125/40  Pulse:   (!) 108 90  Resp:   (!) 30 20  Temp:    98.9 F (37.2 C)  TempSrc:    Oral  SpO2:   92%   Weight: 95.1 kg      Neurology awake and alert ENT with mild pallor Cardiovascular with S1 and S2 present and regular with no gallops, rubs or murmurs Respiratory with rales bilaterally with no wheezing Abdomen with no distention  Bilateral BKA  Data Reviewed:    Family Communication: no family at the bedside   Disposition: Status is: Inpatient Remains inpatient  appropriate because: inpatient renal replacement therapy  Planned Discharge Destination: Home      Author: Coralie Keens, MD 08/19/2022 3:49 PM  For on call review [email or web address].

## 2022-08-19 NOTE — Assessment & Plan Note (Signed)
Continue blood pressure control with carvedilol On HD he has been on midodrine.

## 2022-08-19 NOTE — Assessment & Plan Note (Signed)
Will add bronchodilator therapy and inhales corticosteroids  Air way clearing techniques with flutter valve and incentive spirometer.

## 2022-08-19 NOTE — Progress Notes (Addendum)
[facility] Progress Note   Subjective:    Seen and examined patient at bedside. Patient reports feeling much better after HD overnight. Noted net UF 2.4L. He admits to going over board with his fluids and not eating the proper foods lately. Next HD 8/10. I anticipate he can go home after HD tomorrow but we will assess and take it from there.  Objective Vitals:   08/19/22 0724 08/19/22 0800 08/19/22 0803 08/19/22 1214  BP:  (!) 101/22 (!) 129/55 (!) 125/40  Pulse:   (!) 108 90  Resp:   (!) 30 20  Temp:    98.9 F (37.2 C)  TempSrc:    Oral  SpO2:   92%   Weight: 95.1 kg      Physical Exam General: Pleasant male; Awake, alert, 4L , NAD Heart:S1 and S2; No MRGs Lungs: Clear anterior/posterior; Diminished at bases Abdomen: Large, round, non-tender Extremities: BL BKAs; No edema BL stumps Dialysis Access: TDC; R AVG (malfunction)  Filed Weights   08/19/22 0200 08/19/22 0724  Weight: 97.6 kg 95.1 kg    Intake/Output Summary (Last 24 hours) at 08/19/2022 1334 Last data filed at 08/19/2022 0655 Gross per 24 hour  Intake --  Output 28308.82 ml  Net -28308.82 ml    Additional Objective Labs: Basic Metabolic Panel: Recent Labs  Lab 08/18/22 2125  NA 134*  K 5.0  CL 97*  CO2 20*  GLUCOSE 226*  BUN 66*  CREATININE 11.76*  CALCIUM 7.7*   Liver Function Tests: Recent Labs  Lab 08/18/22 2125  AST 25  ALT 21  ALKPHOS 51  BILITOT 0.8  PROT 6.6  ALBUMIN 2.9*   No results for input(s): "LIPASE", "AMYLASE" in the last 168 hours. CBC: Recent Labs  Lab 08/18/22 2125  WBC 14.5*  NEUTROABS 12.7*  HGB 14.2  HCT 43.1  MCV 92.1  PLT PLATELET CLUMPS NOTED ON SMEAR, UNABLE TO ESTIMATE   Blood Culture    Component Value Date/Time   SDES BLOOD SITE NOT SPECIFIED 08/18/2022 2130   SPECREQUEST  08/18/2022 2130    BOTTLES DRAWN AEROBIC AND ANAEROBIC Blood Culture adequate volume   CULT  08/18/2022 2130    NO GROWTH < 12 HOURS Performed at [facility], [address] [place], [place] [address]    REPTSTATUS PENDING 08/18/2022 2130    Cardiac Enzymes: No results for input(s): "CKTOTAL", "CKMB", "CKMBINDEX", "TROPONINI" in the last 168 hours. CBG: Recent Labs  Lab 08/18/22 2213 08/19/22 0845 08/19/22 1218  GLUCAP 211* 118* 207*   Iron Studies: No results for input(s): "IRON", "TIBC", "TRANSFERRIN", "FERRITIN" in the last 72 hours. Lab Results  Component Value Date   INR 1.2 08/05/2022   INR 1.1 08/17/2019   INR 1.0 11/06/2017   Studies/Results: No results found.  Medications:  azithromycin 500 mg (08/19/22 1244)   cefTRIAXone (ROCEPHIN)  IV 2 g (08/19/22 0943)    aspirin EC  81 mg Oral Daily   atorvastatin  80 mg Oral QHS   carvedilol  12.5 mg Oral BID   Chlorhexidine Gluconate Cloth  6 each Topical Q0600   [START ON 08/20/2022] cinacalcet  30 mg Oral Q T,Th,Sa-HD   fenofibrate  160 mg Oral Daily   gabapentin  300 mg Oral BID   heparin  5,000 Units Subcutaneous Q8H   insulin aspart  0-9 Units Subcutaneous TID WC   insulin detemir  10 Units Subcutaneous QHS   lanthanum  500 mg Oral TID WC  multivitamin  1 tablet Oral Daily   nortriptyline  25 mg Oral QHS    Dialysis Orders: [facility] ([facility]), TTS, EDW 91.5 kg   Assessment/Plan: 1.  Acute hypoxic respiratory failure: Possibly multifactorial but likely from volume overload in this patient with COPD +/- exacerbation from community-acquired pneumonia.  Started on treatment for the latter and we will order for emergent hemodialysis overnight with aggressive ultrafiltration for volume unloading. 2.  End-stage renal disease: Emergent hemodialysis overnight 8/8 for volume management, 2.4L removed. Next HD 8/10. 3.  Hypotension: Blood pressure variable but stable currently, volume improving with HD. 4.  Anemia of chronic disease: Hgb 14.2, no Fe or ESA is indicated at this time 5.  Secondary hyperparathyroidism: Checking labs including phos in the AM. Continue  binders and Sensipar for now.   [name], NP [facility] 08/19/2022,1:34 PM  LOS: 1 day

## 2022-08-19 NOTE — Assessment & Plan Note (Signed)
He has prosthesis for ambulation.

## 2022-08-19 NOTE — Assessment & Plan Note (Signed)
No acute coronary syndrome. Continue blood pressure control.

## 2022-08-19 NOTE — Assessment & Plan Note (Addendum)
Pneumonia has been ruled out. .  Patient with no fever.  Wbc mild elevation,  Follow up chest radiograph with no signs of pneumonia.  Continue to hold on antibiotic therapy.

## 2022-08-19 NOTE — Assessment & Plan Note (Signed)
Continue with nortriptyline.

## 2022-08-20 ENCOUNTER — Inpatient Hospital Stay (HOSPITAL_COMMUNITY): Payer: Medicare Other

## 2022-08-20 DIAGNOSIS — N186 End stage renal disease: Secondary | ICD-10-CM | POA: Diagnosis not present

## 2022-08-20 DIAGNOSIS — J189 Pneumonia, unspecified organism: Secondary | ICD-10-CM | POA: Diagnosis not present

## 2022-08-20 DIAGNOSIS — E1169 Type 2 diabetes mellitus with other specified complication: Secondary | ICD-10-CM | POA: Diagnosis not present

## 2022-08-20 DIAGNOSIS — I5022 Chronic systolic (congestive) heart failure: Secondary | ICD-10-CM | POA: Diagnosis not present

## 2022-08-20 LAB — GLUCOSE, CAPILLARY
Glucose-Capillary: 185 mg/dL — ABNORMAL HIGH (ref 70–99)
Glucose-Capillary: 119 mg/dL — ABNORMAL HIGH (ref 70–99)
Glucose-Capillary: 188 mg/dL — ABNORMAL HIGH (ref 70–99)
Glucose-Capillary: 167 mg/dL — ABNORMAL HIGH (ref 70–99)

## 2022-08-20 MED ORDER — HEPARIN SODIUM (PORCINE) 1000 UNIT/ML IJ SOLN
INTRAMUSCULAR | Status: AC
  Administered 2022-08-20: 3800 [IU]
  Filled 2022-08-20: qty 4

## 2022-08-20 MED ORDER — IPRATROPIUM-ALBUTEROL 0.5-2.5 (3) MG/3ML IN SOLN: 3.0000 mL | RESPIRATORY_TRACT | Status: DC | PRN

## 2022-08-20 MED ORDER — ONDANSETRON HCL 4 MG/2ML IJ SOLN
4.0000 mg | Freq: Four times a day (QID) | INTRAMUSCULAR | Status: DC | PRN
  Administered 2022-08-20 – 2022-08-23 (×2): 4 mg via INTRAVENOUS
  Filled 2022-08-20 (×3): qty 2

## 2022-08-20 MED ORDER — IPRATROPIUM-ALBUTEROL 0.5-2.5 (3) MG/3ML IN SOLN
3.0000 mL | Freq: Four times a day (QID) | RESPIRATORY_TRACT | Status: DC
  Administered 2022-08-20 – 2022-08-22 (×7): 3 mL via RESPIRATORY_TRACT
  Filled 2022-08-20 (×8): qty 3

## 2022-08-20 MED ORDER — GUAIFENESIN-DM 100-10 MG/5ML PO SYRP: 5.0000 mL | ORAL_SOLUTION | ORAL | Status: DC | PRN

## 2022-08-20 MED ORDER — MOMETASONE FURO-FORMOTEROL FUM 100-5 MCG/ACT IN AERO
2.0000 | INHALATION_SPRAY | Freq: Two times a day (BID) | RESPIRATORY_TRACT | Status: DC
  Administered 2022-08-20 – 2022-08-26 (×12): 2 via RESPIRATORY_TRACT
  Filled 2022-08-20: qty 8.8

## 2022-08-20 NOTE — Progress Notes (Addendum)
Received patient in bed.Awake,alert and oriented x 4. Consent verified.  Medicine given : Midodrine 10 mg.  Accessed used : Right HD catheter that worked well.Dressing on date.  Duration of treatment: 4 hours.  Fluid removed. 2.3 liters.  Hemo comment:Tolerated treatment  Hand off to the patient's nurse.

## 2022-08-20 NOTE — Evaluation (Signed)
Physical Therapy Evaluation Patient Details Name: Anthony Bullock MRN: [id number] DOB: 1960/11/01 Today's Date: 08/20/2022  History of Present Illness  Pt is a 61 y/o M presenting to ED on 8/8 with SOB, volume overload. PMH includes ESRD on HD TTS, HTN, HLD, DM2, insulin dependent with peripheral diabetic neuropahty, s/p bilateral BKA, anxiety, depression   Clinical Impression  Pt presents with condition above and deficits mentioned below, see PT Problem List. PTA, he was mod I utilizing his quad cane for functional mobility when his bil prostheses were donned. He resides with his wife in a 1-level house with a ramped entrance. Currently, pt reports feeling fatigued and weak from HD, but otherwise at his baseline. He was able to ambulate within the room at a contact guard assist level with the RW, but displayed deficits in muscular and aerobic endurance. Pt will likely progress well and not need any follow-up PT at d/c. Will continue to follow acutely to maximize his return to baseline prior to d/c home. Provided pt and wife with energy conservation handout.      If plan is discharge home, recommend the following: Assistance with cooking/housework;Assist for transportation;Help with stairs or ramp for entrance   Can travel by private vehicle        Equipment Recommendations None recommended by PT  Recommendations for Other Services       Functional Status Assessment Patient has had a recent decline in their functional status and demonstrates the ability to make significant improvements in function in a reasonable and predictable amount of time.     Precautions / Restrictions Precautions Precautions: Fall Precaution Comments: bil BKA with prostheses in room, watch O2 Restrictions Weight Bearing Restrictions: No      Mobility  Bed Mobility               General bed mobility comments: Pt sitting in recliner upon arrival.    Transfers Overall transfer level: Needs  assistance Equipment used: Rolling walker (2 wheels) Transfers: Sit to/from Stand Sit to Stand: Contact guard assist           General transfer comment: Pt able to stand from recliner at a contact guard assist level without LOB    Ambulation/Gait Ambulation/Gait assistance: Contact guard assist Gait Distance (Feet): 20 Feet Assistive device: Rolling walker (2 wheels) Gait Pattern/deviations: Step-through pattern, Decreased step length - left, Decreased step length - right, Decreased stride length Gait velocity: reduced Gait velocity interpretation: <1.8 ft/sec, indicate of risk for recurrent falls   General Gait Details: Pt ambulates anterior <> posterior at recliner with bil prostheses donned and RW for support, no LOB, contact guard assist for safety  Stairs            Wheelchair Mobility     Tilt Bed    Modified Rankin (Stroke Patients Only)       Balance Overall balance assessment: Needs assistance Sitting-balance support: Feet supported Sitting balance-Leahy Scale: Fair     Standing balance support: During functional activity, Reliant on assistive device for balance, Bilateral upper extremity supported Standing balance-Leahy Scale: Poor Standing balance comment: reliant on external support                             Pertinent Vitals/Pain Pain Assessment Pain Assessment: Faces Faces Pain Scale: No hurt Pain Intervention(s): Monitored during session    Home Living Family/patient expects to be discharged to:: Private residence Living Arrangements: Spouse/significant other Available Help  at Discharge: Family;Available 24 hours/day Type of Home: House Home Access: Ramped entrance       Home Layout: One level Home Equipment: Agricultural consultant (2 wheels);Wheelchair - manual;BSC/3in1;Other (comment);Rollator (4 wheels);Cane - quad ([employer])      Prior Function Prior Level of Function : Independent/Modified Independent              Mobility Comments: Quad cane all the time for mobility ADLs Comments: ind, manages own meds, spouse provides transportation     Extremity/Trunk Assessment   Upper Extremity Assessment Upper Extremity Assessment: Defer to OT evaluation    Lower Extremity Assessment Lower Extremity Assessment: RLE deficits/detail;LLE deficits/detail;Generalized weakness RLE Deficits / Details: hx of BKA, prosthesis present and donned upon arrival; weak after HD LLE Deficits / Details: hx of BKA, prosthesis present and donned upon arrival; weak after HD    Cervical / Trunk Assessment Cervical / Trunk Assessment: Normal  Communication   Communication Communication: No apparent difficulties  Cognition Arousal: Alert Behavior During Therapy: WFL for tasks assessed/performed Overall Cognitive Status: Within Functional Limits for tasks assessed                                          General Comments General comments (skin integrity, edema, etc.): SpO2 at 82% on RA upon arrival, educated pt to donn [place] with it rebounding to 90s%, dropped to 80s% with standing mobility and rebounded with seated rest break; educated pt on limiting sodium and processed foods, eating healthy and fresh/frozen fruits/veggies, frequent mobility, weighing self daily, and energy conservation techniques and provided pt with [facility] handout    Exercises     Assessment/Plan    PT Assessment Patient needs continued PT services  PT Problem List Decreased strength;Decreased activity tolerance;Decreased balance;Decreased mobility       PT Treatment Interventions DME instruction;Gait training;Therapeutic activities;Functional mobility training;Therapeutic exercise;Neuromuscular re-education;Balance training;Patient/family education    PT Goals (Current goals can be found in the Care Plan section)  Acute Rehab PT Goals Patient Stated Goal: to improve and go home PT Goal Formulation: With patient/family Time For Goal  Achievement: 09/03/22 Potential to Achieve Goals: Good    Frequency Min 1X/week     Co-evaluation               AM-PAC PT "6 Clicks" Mobility  Outcome Measure Help needed turning from your back to your side while in a flat bed without using bedrails?: A Little Help needed moving from lying on your back to sitting on the side of a flat bed without using bedrails?: A Little Help needed moving to and from a bed to a chair (including a wheelchair)?: A Little Help needed standing up from a chair using your arms (e.g., wheelchair or bedside chair)?: A Little Help needed to walk in hospital room?: A Little Help needed climbing 3-5 steps with a railing? : A Lot 6 Click Score: 17    End of Session   Activity Tolerance: Patient tolerated treatment well Patient left: in chair;with call bell/phone within reach;with chair alarm set;with family/visitor present   PT Visit Diagnosis: Unsteadiness on feet (R26.81);Other abnormalities of gait and mobility (R26.89);Difficulty in walking, not elsewhere classified (R26.2);Muscle weakness (generalized) (M62.81)    Time: 0347-4259 PT Time Calculation (min) (ACUTE ONLY): 17 min   Charges:   PT Evaluation $PT Eval Moderate Complexity: 1 Mod   PT General Charges $$ ACUTE  PT VISIT: 1 Visit         Raymond Gurney, PT, DPT Acute Rehabilitation Services  Office: [phone]   Jewel Baize 08/20/2022, 3:07 PM

## 2022-08-20 NOTE — Evaluation (Signed)
Occupational Therapy Evaluation Patient Details Name: Anthony Bullock MRN: [id number] DOB: 24-Nov-1960 Today's Date: 08/20/2022   History of Present Illness Pt is a 62 y/o M presenting to ED on 8/8 with SOB, volume overload. PMH includes ESRD on HD TTS, HTN, HLD, DM2, insulin dependent with peripheral diabetic neuropahty, s/p bilateral BKA, anxiety, depression   Clinical Impression   Pt reports ind with ADLs and uses cane for functional mobility, lives with spouse who assists with transportation. Pt seen post-HD session, feels very tired. Pt needing set up -mod A for ADLs, min A for bed mobility and min A for pivot transfer to chair without AD. Pt holding onto arms of chair to pivot over. Pt able to don bil prosthesis without assist. SpO2 mid-high 80's during session on 3L, increased to 3.5L and pt satting low 90's, RN /MD in room and aware. Pt presenting with impairments listed below, will follow acutely. Recommend HHOT at d/c pending progression.       If plan is discharge home, recommend the following: A little help with walking and/or transfers;A lot of help with bathing/dressing/bathroom;Help with stairs or ramp for entrance;Assist for transportation    Functional Status Assessment  Patient has had a recent decline in their functional status and demonstrates the ability to make significant improvements in function in a reasonable and predictable amount of time.  Equipment Recommendations  None recommended by OT (pt has all needed DME)    Recommendations for Other Services PT consult     Precautions / Restrictions Precautions Precautions: Fall Precaution Comments: bil BKA, watch O2 Restrictions Weight Bearing Restrictions: No      Mobility Bed Mobility Overal bed mobility: Needs Assistance Bed Mobility: Supine to Sit     Supine to sit: Min assist     General bed mobility comments: increased time    Transfers Overall transfer level: Needs assistance Equipment used: 1  person hand held assist Transfers: Sit to/from Stand Sit to Stand: Min assist                  Balance Overall balance assessment: Needs assistance Sitting-balance support: Feet supported Sitting balance-Leahy Scale: Fair     Standing balance support: During functional activity, Reliant on assistive device for balance Standing balance-Leahy Scale: Poor Standing balance comment: reliant on external support                           ADL either performed or assessed with clinical judgement   ADL Overall ADL's : Needs assistance/impaired Eating/Feeding: Set up;Sitting   Grooming: Set up;Sitting   Upper Body Bathing: Minimal assistance;Sitting   Lower Body Bathing: Moderate assistance;Sitting/lateral leans   Upper Body Dressing : Minimal assistance;Sitting   Lower Body Dressing: Moderate assistance;Sitting/lateral leans   Toilet Transfer: Minimal assistance;Stand-pivot;BSC/3in1           Functional mobility during ADLs: Minimal assistance       Vision Baseline Vision/History: 1 Wears glasses (eye patch over R eye glass)       Perception Perception: Not tested       Praxis Praxis: Not tested       Pertinent Vitals/Pain Pain Assessment Pain Assessment: No/denies pain     Extremity/Trunk Assessment Upper Extremity Assessment Upper Extremity Assessment: Overall WFL for tasks assessed   Lower Extremity Assessment Lower Extremity Assessment: Defer to PT evaluation       Communication Communication Communication: No apparent difficulties   Cognition Arousal: Alert Behavior During  Therapy: WFL for tasks assessed/performed Overall Cognitive Status: Within Functional Limits for tasks assessed                                       General Comments  SpO2 down to mid -high 80's on 3L , increased to 3.5L and pt satting low 90's. RN and MD aware/in room upon arrival    Exercises     Shoulder Instructions      Home Living  Family/patient expects to be discharged to:: Private residence Living Arrangements: Spouse/significant other Available Help at Discharge: Family;Available 24 hours/day Type of Home: House Home Access: Ramped entrance     Home Layout: One level     Bathroom Shower/Tub: Walk-in shower;Sponge bathes at baseline         Home Equipment: Agricultural consultant (2 wheels);Wheelchair - manual;BSC/3in1;Other (comment);Rollator (4 wheels) (slide board)          Prior Functioning/Environment Prior Level of Function : Independent/Modified Independent             Mobility Comments: cane all the time for mobility ADLs Comments: ind, manages own meds, spouse provides transportation        OT Problem List: Decreased strength;Decreased range of motion;Decreased activity tolerance      OT Treatment/Interventions: Self-care/ADL training;Therapeutic exercise;DME and/or AE instruction;Energy conservation;Therapeutic activities;Balance training;Patient/family education    OT Goals(Current goals can be found in the care plan section) Acute Rehab OT Goals Patient Stated Goal: none stated OT Goal Formulation: With patient Time For Goal Achievement: 09/03/22 Potential to Achieve Goals: Good ADL Goals Pt Will Perform Upper Body Dressing: with contact guard assist;sitting Pt Will Perform Lower Body Dressing: with contact guard assist;sitting/lateral leans;sit to/from stand Pt Will Transfer to Toilet: with contact guard assist;ambulating;regular height toilet Additional ADL Goal #1: pt will verablize x3 energy conservation strategies in prep for ADLs  OT Frequency: Min 1X/week    Co-evaluation              AM-PAC OT "6 Clicks" Daily Activity     Outcome Measure Help from another person eating meals?: None Help from another person taking care of personal grooming?: None Help from another person toileting, which includes using toliet, bedpan, or urinal?: A Lot Help from another person bathing  (including washing, rinsing, drying)?: A Lot Help from another person to put on and taking off regular upper body clothing?: A Little Help from another person to put on and taking off regular lower body clothing?: A Little 6 Click Score: 18   End of Session Equipment Utilized During Treatment: Gait belt Nurse Communication: Mobility status  Activity Tolerance: Patient tolerated treatment well Patient left: in chair;with call bell/phone within reach;with chair alarm set;with nursing/sitter in room;with family/visitor present  OT Visit Diagnosis: Unsteadiness on feet (R26.81);Other abnormalities of gait and mobility (R26.89);Muscle weakness (generalized) (M62.81)                Time: 1240-1302 OT Time Calculation (min): 22 min Charges:  OT General Charges $OT Visit: 1 Visit OT Evaluation $OT Eval Moderate Complexity: 1 Mod   K, OTD, OTR/L SecureChat Preferred Acute Rehab [phone]    K Koonce 08/20/2022, 1:21 PM

## 2022-08-20 NOTE — Progress Notes (Signed)
OT Cancellation Note  Patient Details Name: Anthony Bullock MRN: [id number] DOB: [birth year]   Cancelled Treatment:    Reason Eval/Treat Not Completed: Patient at procedure or test/ unavailable (off unit at HD, will follow up for OT evaluation as schedule permits)  Carver Fila, OTD, OTR/L SecureChat Preferred Acute Rehab [phone]   Dalphine Handing 08/20/2022, 7:43 AM

## 2022-08-20 NOTE — Progress Notes (Signed)
Progress Note   Patient: Anthony Bullock NFA:[id number] DOB: Nov 19, 1960 DOA: 08/18/2022     2 DOS: the patient was seen and examined on 08/20/2022   Brief hospital course: Anthony Bullock was admitted to the hospital with the working diagnosis of volume overload.   62 yo male with the past medical history of hypertension, hyperlipidemia, T2DM, bilateral BKA, and ESRD on HD who presented with dyspnea and cough. Patient developed acute dyspnea and cough. He went to his outpatient HD unit, [facility] [place]) he was found hypoxic, 02 saturation 70% on room air. He was placed on supplemental 02 per non re-brether 15L and was transferred to the ED. He reports not being compliant with salt or fluid restrictions at home.  In the ED his blood pressure was 139/71, HR 101, RR 20 and 02 saturation 97%, lungs with bilateral rhonchi, with no wheezing, heart with S1 and S2 present and regular with no gallops, abdomen with no distention, bilateral BKA.   Na 134, K 5,0 Cl 97, bicarbonate 20, glucose 226 bun 66 cr 11,7 Anion gap 17  BNP 1,158  High sensitive troponin 674 and 702  Lactic acid 1,2 and 2,9  Wbc 14,5 hgb 14.2   C[facility] from [facility] with reported pulmonary edema and small pleural effusions.   EKG 104 bpm, left axis deviation, left bundle branch block, sinus rhythm with left atrial enlargement, no significant ST segment or T wave changes.   08/08 Nephrology was consulted and recommendations for emergent hemodialysis.      Assessment and Plan: * ESRD on dialysis (HCC) Volume overload.  Acute hypoxemic respiratory failure.  Patient with improvement of his symptoms after ultrafiltration.  Post HD continue to have low 02 saturation and requiring supplemental 02 per [place]   Follow up chest radiograph with improvement in infiltrates but continue to have features of pulmonary edema.   Anemia of chronic renal disease.  Hgb has been stable.   Metabolic bone disease, continue with cinacalcet.    Chronic systolic congestive heart failure (HCC) Echocardiogram from 2021 with preserved LV systolic function EF 50%, moderate LVH, RV systolic function preserved, LA with mild dilatation, no significant valvular disease.   Volume status has improved with ultrafiltration. Continue carvedilol.  Blood pressure support with midodrine on HD days.   Pneumonia due to infectious organism Doubt pneumonia.  Patient with no fever.  Wbc mild elevation,  Follow up chest radiograph with no signs of pneumonia.  Continue to hold on antibiotic therapy.   Essential hypertension Continue blood pressure control with carvedilol On HD he has been on midodrine.   Type 2 diabetes mellitus with hyperlipidemia (HCC) Uncontrolled with hyperglycemia.   Continue insulin sliding scale for glucose cover and monitoring. Basal insulin 12 units Patient is tolerating po well.   COPD (chronic obstructive pulmonary disease) (HCC) Will add bronchodilator therapy and inhales corticosteroids  Air way clearing techniques with flutter valve and incentive spirometer.   Adjustment disorder with depressed mood Continue with nortriptyline.   CAD (coronary artery disease) No acute coronary syndrome. Continue blood pressure control.   S/P bilateral below knee amputation (HCC) He has prosthesis for ambulation.        Subjective: Patient with cough and dyspnea post HD, he is requiring supplemental 02 post HD  Physical Exam: Vitals:   08/20/22 1146 08/20/22 1151 08/20/22 1152 08/20/22 1249  BP: 132/66 (!) 144/61  (!) 142/64  Pulse: 96 99  (!) 109  Resp: (!) 27 (!) 21  20  Temp:  98 F (36.7 C)  98.3 F (36.8 C)  TempSrc:    Oral  SpO2: 99% 99%  (!) 88%  Weight:   93.2 kg    Neurology awake and alert ENT With  mild pallor Cardiovascular with S1 and S2 present and regular with no gallops or rubs, no murmurs Respiratory with no rales or wheezing Abdomen with no distention  No peripheral edema  Data  Reviewed:    Family Communication: I spoke with patient's wife at the bedside, we talked in detail about patient's condition, plan of care and prognosis and all questions were addressed.   Disposition: Status is: Inpatient Remains inpatient appropriate because: hypoxemia  Planned Discharge Destination: Home     Author: Coralie Keens, MD 08/20/2022 2:07 PM  For on call review [email or web address].

## 2022-08-20 NOTE — Progress Notes (Signed)
[facility] Progress Note   Subjective:   Seen on HD. On O2 4L but he feels his SOB is improving. Denies CP, dizziness, and nausea.   Objective Vitals:   08/20/22 0600 08/20/22 0726 08/20/22 0733 08/20/22 0803  BP:  121/63 123/80 119/64  Pulse:  (!) 108 (!) 108 99  Resp: 20 17 (!) 27 (!) 22  Temp:  98.4 F (36.9 C)    TempSrc:      SpO2:  98% (!) 87% 97%  Weight:  95.5 kg     Physical Exam General: Alert male in NAD Heart: RRR, no murmurs, rubs or gallops Lungs: CTA bilaterally but diminished in bases. On O2 via [place], respirations unlabored Abdomen: Soft, non-distended, +BS Extremities: B/l BKA, no LE edema appreciated Dialysis Access:  [facility] accessed  Additional Objective Labs: Basic Metabolic Panel: Recent Labs  Lab 08/18/22 2125 08/20/22 0436  NA 134* 129*  K 5.0 3.8  CL 97* 90*  CO2 20* 23  GLUCOSE 226* 200*  BUN 66* 40*  CREATININE 11.76* 9.09*  CALCIUM 7.7* 8.0*  PHOS  --  3.8   Liver Function Tests: Recent Labs  Lab 08/18/22 2125 08/20/22 0436  AST 25  --   ALT 21  --   ALKPHOS 51  --   BILITOT 0.8  --   PROT 6.6  --   ALBUMIN 2.9* 2.4*   No results for input(s): "LIPASE", "AMYLASE" in the last 168 hours. CBC: Recent Labs  Lab 08/18/22 2125 08/20/22 0436  WBC 14.5* 12.7*  NEUTROABS 12.7* 9.8*  HGB 14.2 11.3*  HCT 43.1 34.5*  MCV 92.1 92.5  PLT PLATELET CLUMPS NOTED ON SMEAR, UNABLE TO ESTIMATE 114*   Blood Culture    Component Value Date/Time   SDES BLOOD SITE NOT SPECIFIED 08/18/2022 2130   SPECREQUEST  08/18/2022 2130    BOTTLES DRAWN AEROBIC AND ANAEROBIC Blood Culture adequate volume   CULT  08/18/2022 2130    NO GROWTH < 12 HOURS Performed at [facility], [address] [place], [place] [address]    REPTSTATUS PENDING 08/18/2022 2130    Cardiac Enzymes: No results for input(s): "CKTOTAL", "CKMB", "CKMBINDEX", "TROPONINI" in the last 168 hours. CBG: Recent Labs  Lab 08/19/22 0845 08/19/22 1218  08/19/22 1822 08/19/22 2111 08/20/22 0607  GLUCAP 118* 207* 204* 287* 185*   Iron Studies: No results for input(s): "IRON", "TIBC", "TRANSFERRIN", "FERRITIN" in the last 72 hours. @lablastinr3 @ Studies/Results: DG Chest 1 View  Result Date: 08/19/2022 CLINICAL DATA:  Dyspnea EXAM: CHEST  1 VIEW COMPARISON:  X-ray 07/18/2022 and older FINDINGS: Stable double lumen right IJ catheter with tip along the SVC right atrial junction region. Left-sided defibrillator. Overlapping cardiac leads. Stable cardiopericardial silhouette with central vascular congestion and interstitial changes. No pneumothorax or large effusion. Overlapping cardiac leads. IMPRESSION: No significant interval change when adjusting for technique. Electronically Signed   By: Karen Kays M.D.   On: 08/19/2022 17:10   Medications:   aspirin EC  81 mg Oral Daily   atorvastatin  80 mg Oral QHS   carvedilol  12.5 mg Oral BID WC   Chlorhexidine Gluconate Cloth  6 each Topical Q0600   cinacalcet  30 mg Oral Q T,Th,Sa-HD   fenofibrate  160 mg Oral Daily   gabapentin  300 mg Oral BID   heparin  5,000 Units Subcutaneous Q8H   insulin aspart  0-9 Units Subcutaneous TID WC   insulin glargine-yfgn  12 Units Subcutaneous QHS  lanthanum  500 mg Oral TID WC   midodrine  10 mg Oral Q T,Th,Sa-HD   multivitamin  1 tablet Oral Daily   nortriptyline  25 mg Oral QHS    Dialysis Orders: [facility] TTS, EDW 91.5 kg   Assessment/Plan: 1.  Acute hypoxic respiratory failure: Possibly multifactorial but likely from volume overload in this patient with COPD, pneumonia less likely based on clinical picture. Serial HD, improving but still requiring O2. Na 129 suggesting ongoing volume overload.  2.  End-stage renal disease: Emergent hemodialysis overnight 8/8 for volume management, 2.4L removed. Further UF with HD today 3.  Hypotension: Blood pressure improved with HD, continue carvedilol and midodrine pre-HD 4.  Anemia of chronic  disease: Hgb at goal, no Fe or ESA is indicated at this time 5.  Secondary hyperparathyroidism: Calcium and phos controlled. Continue binders and Sensipar   Rogers Blocker, PA-C 08/20/2022, 8:25 AM  [facility] Pager: [phone]

## 2022-08-20 NOTE — Progress Notes (Signed)
PT Cancellation Note  Patient Details Name: BRADD CUBERO MRN: [id number] DOB: Aug 16, 1960   Cancelled Treatment:    Reason Eval/Treat Not Completed: (P) Patient at procedure or test/unavailable. Pt at HD. Will plan to follow-up after as able.   Raymond Gurney, PT, DPT Acute Rehabilitation Services  Office: [phone]    Jewel Baize 08/20/2022, 7:56 AM

## 2022-08-21 ENCOUNTER — Other Ambulatory Visit: Payer: Self-pay

## 2022-08-21 DIAGNOSIS — I10 Essential (primary) hypertension: Secondary | ICD-10-CM | POA: Diagnosis not present

## 2022-08-21 DIAGNOSIS — I5022 Chronic systolic (congestive) heart failure: Secondary | ICD-10-CM | POA: Diagnosis not present

## 2022-08-21 DIAGNOSIS — J189 Pneumonia, unspecified organism: Secondary | ICD-10-CM | POA: Diagnosis not present

## 2022-08-21 DIAGNOSIS — N186 End stage renal disease: Secondary | ICD-10-CM | POA: Diagnosis not present

## 2022-08-21 LAB — GLUCOSE, CAPILLARY
Glucose-Capillary: 244 mg/dL — ABNORMAL HIGH (ref 70–99)
Glucose-Capillary: 314 mg/dL — ABNORMAL HIGH (ref 70–99)
Glucose-Capillary: 231 mg/dL — ABNORMAL HIGH (ref 70–99)
Glucose-Capillary: 221 mg/dL — ABNORMAL HIGH (ref 70–99)
Glucose-Capillary: 269 mg/dL — ABNORMAL HIGH (ref 70–99)

## 2022-08-21 LAB — BASIC METABOLIC PANEL WITH GFR
Anion gap: 16 — ABNORMAL HIGH (ref 5–15)
BUN: 32 mg/dL — ABNORMAL HIGH (ref 6–20)
CO2: 25 mmol/L (ref 22–32)
Calcium: 8.2 mg/dL — ABNORMAL LOW (ref 8.9–10.3)
Chloride: 89 mmol/L — ABNORMAL LOW (ref 98–111)
Creatinine, Ser: 6.58 mg/dL — ABNORMAL HIGH (ref 0.61–1.24)
GFR, Estimated: 9 mL/min — ABNORMAL LOW (ref >60)
Glucose, Bld: 251 mg/dL — ABNORMAL HIGH (ref 70–99)
Potassium: 3.7 mmol/L (ref 3.5–5.1)
Sodium: 130 mmol/L — ABNORMAL LOW (ref 135–145)

## 2022-08-21 LAB — CBC
HCT: 34.1 % — ABNORMAL LOW (ref 39.0–52.0)
Hemoglobin: 11 g/dL — ABNORMAL LOW (ref 13.0–17.0)
MCH: 30.1 pg (ref 26.0–34.0)
MCHC: 32.3 g/dL (ref 30.0–36.0)
MCV: 93.2 fL (ref 80.0–100.0)
Platelets: 119 10*3/uL — ABNORMAL LOW (ref 150–400)
RBC: 3.66 MIL/uL — ABNORMAL LOW (ref 4.22–5.81)
RDW: 15.4 % (ref 11.5–15.5)
WBC: 11.5 10*3/uL — ABNORMAL HIGH (ref 4.0–10.5)
nRBC: 0 % (ref 0.0–0.2)

## 2022-08-21 MED ORDER — CHLORHEXIDINE GLUCONATE CLOTH 2 % EX PADS
6.0000 | MEDICATED_PAD | Freq: Every day | CUTANEOUS | Status: DC
  Administered 2022-08-21 – 2022-08-26 (×6): 6 via TOPICAL

## 2022-08-21 NOTE — Progress Notes (Signed)
Progress Note   Patient: Anthony Bullock OZH:[id number] DOB: [birth year] DOA: 08/18/2022     3 DOS: the patient was seen and examined on 08/21/2022   Brief hospital course: Mr. Debevec was admitted to the hospital with the working diagnosis of volume overload.   62 yo male with the past medical history of hypertension, hyperlipidemia, T2DM, bilateral BKA, and ESRD on HD who presented with dyspnea and cough. Patient developed acute dyspnea and cough. He went to his outpatient HD [facility], [facility]) he was found hypoxic, 02 saturation 70% on room air. He was placed on supplemental 02 per non re-brether 15L and was transferred to the ED. He reports not being compliant with salt or fluid restrictions at home.  In the ED his blood pressure was 139/71, HR 101, RR 20 and 02 saturation 97%, lungs with bilateral rhonchi, with no wheezing, heart with S1 and S2 present and regular with no gallops, abdomen with no distention, bilateral BKA.   Na 134, K 5,0 Cl 97, bicarbonate 20, glucose 226 bun 66 cr 11,7 Anion gap 17  BNP 1,158  High sensitive troponin 674 and 702  Lactic acid 1,2 and 2,9  Wbc 14,5 hgb 14.2   Chest radiograph from [facility] with reported pulmonary edema and small pleural effusions.   EKG 104 bpm, left axis deviation, left bundle branch block, sinus rhythm with left atrial enlargement, no significant ST segment or T wave changes.   08/08 Nephrology was consulted and recommendations for emergent hemodialysis.      Assessment and Plan: * ESRD on dialysis (HCC) Volume overload. Hyponatremia.   Acute hypoxemic respiratory failure.  Follow up chest radiograph with improvement in infiltrates but continue to have features of pulmonary edema.   02 saturation today 98% on 4 L/min per [place].  Positive cough and hemoptysis.  Plan for further inpatient renal replacement therapy with ultrafiltration.   Na 130, K 3,7, serum bicarbonate 25, BUN 32.   Anemia of chronic renal disease.  Hgb has been  stable.   Metabolic bone disease, continue with cinacalcet.   Chronic systolic congestive heart failure (HCC) Echocardiogram from 2021 with preserved LV systolic function EF 50%, moderate LVH, RV systolic function preserved, LA with mild dilatation, no significant valvular disease.   Continue carvedilol.  Blood pressure support with midodrine on HD days.  Continue with signs of hypervolemia.    Pneumonia due to infectious organism Doubt pneumonia.  Patient with no fever.  Wbc mild elevation,  Follow up chest radiograph with no signs of pneumonia.  Continue to hold on antibiotic therapy.   Essential hypertension Continue blood pressure control with carvedilol On HD he has been on midodrine.   Type 2 diabetes mellitus with hyperlipidemia (HCC) Uncontrolled with hyperglycemia.   Continue insulin sliding scale for glucose cover and monitoring. Basal insulin 12 units Patient is tolerating po well.  Fasting glucose today is 251 mg/dl.   COPD (chronic obstructive pulmonary disease) (HCC) Continue with bronchodilator therapy and inhales corticosteroids  Air way clearing techniques with flutter valve and incentive spirometer.   Adjustment disorder with depressed mood Continue with nortriptyline.   CAD (coronary artery disease) No acute coronary syndrome. Continue blood pressure control.   S/P bilateral below knee amputation (HCC) He has prosthesis for ambulation.         Subjective: Dyspnea has improved but no back to baseline, positive cough with hemoptysis.   Physical Exam: Vitals:   08/21/22 0315 08/21/22 0933 08/21/22 0938 08/21/22 1216  BP: (!) 116/55   [name])  109/53  Pulse: 89 81 80   Resp: 18 18 18    Temp: 98.4 F (36.9 C)  98.3 F (36.8 C) 98.3 F (36.8 C)  TempSrc: Oral  Oral Oral  SpO2: 97% 92%    Weight:       Neurology awake and alert ENT with mild pallor Cardiovascular with S1 and S2 present and regular with no gallops, rubs or  murmur Respiratory with bilateral rales and rhonchi with no wheezing Abdomen with no distention  No peripheral edema  Data Reviewed:    Family Communication: I spoke with patient's wife at the bedside, we talked in detail about patient's condition, plan of care and prognosis and all questions were addressed.   Disposition: Status is: Inpatient Remains inpatient appropriate because: inpatient renal replacement therapy   Planned Discharge Destination: Home     Author: Coralie Keens, MD 08/21/2022 1:59 PM  For on call review [email or web address].

## 2022-08-21 NOTE — Plan of Care (Signed)

## 2022-08-21 NOTE — Progress Notes (Signed)
[facility] Progress Note   Subjective:   Net UF 2.3L yesterday, has hypotension on HD requiring midodrine. Still on O2 today. He feels his breathing is better than yesterday. Denies CP, dizziness, nausea.   Objective Vitals:   08/20/22 1249 08/20/22 1938 08/20/22 2320 08/21/22 0315  BP: (!) 142/64 (!) 118/55 (!) 120/54 (!) 116/55  Pulse: (!) 109 97 87 89  Resp: 20 17 20 18   Temp: 98.3 F (36.8 C) 99.4 F (37.4 C) 99.7 F (37.6 C) 98.4 F (36.9 C)  TempSrc: Oral Oral Oral Oral  SpO2: (!) 88% 92% 93% 97%  Weight:       Physical Exam  General: Alert male in NAD Heart: RRR, no murmurs, rubs or gallops Lungs: CTA bilaterally but diminished in bases. On O2 via [place], respirations unlabored Abdomen: Soft, non-distended, +BS Extremities: B/l BKA, no LE edema appreciated Dialysis Access:  [facility]   Additional Objective Labs: Basic Metabolic Panel: Recent Labs  Lab 08/18/22 2125 08/20/22 0436 08/21/22 0409  NA 134* 129* 130*  K 5.0 3.8 3.7  CL 97* 90* 89*  CO2 20* 23 25  GLUCOSE 226* 200* 251*  BUN 66* 40* 32*  CREATININE 11.76* 9.09* 6.58*  CALCIUM 7.7* 8.0* 8.2*  PHOS  --  3.8  --    Liver Function Tests: Recent Labs  Lab 08/18/22 2125 08/20/22 0436  AST 25  --   ALT 21  --   ALKPHOS 51  --   BILITOT 0.8  --   PROT 6.6  --   ALBUMIN 2.9* 2.4*   No results for input(s): "LIPASE", "AMYLASE" in the last 168 hours. CBC: Recent Labs  Lab 08/18/22 2125 08/20/22 0436 08/21/22 0409  WBC 14.5* 12.7* 11.5*  NEUTROABS 12.7* 9.8*  --   HGB 14.2 11.3* 11.0*  HCT 43.1 34.5* 34.1*  MCV 92.1 92.5 93.2  PLT PLATELET CLUMPS NOTED ON SMEAR, UNABLE TO ESTIMATE 114* 119*   Blood Culture    Component Value Date/Time   SDES BLOOD SITE NOT SPECIFIED 08/18/2022 2130   SPECREQUEST  08/18/2022 2130    BOTTLES DRAWN AEROBIC AND ANAEROBIC Blood Culture adequate volume   CULT  08/18/2022 2130    NO GROWTH 2 DAYS Performed at [facility], [address] [place], [place] [address]    REPTSTATUS PENDING 08/18/2022 2130    Cardiac Enzymes: No results for input(s): "CKTOTAL", "CKMB", "CKMBINDEX", "TROPONINI" in the last 168 hours. CBG: Recent Labs  Lab 08/20/22 0607 08/20/22 1244 08/20/22 1720 08/20/22 2115 08/21/22 0608  GLUCAP 185* 119* 188* 167* 244*   Iron Studies: No results for input(s): "IRON", "TIBC", "TRANSFERRIN", "FERRITIN" in the last 72 hours. @lablastinr3 @ Studies/Results: DG Chest 1 View  Result Date: 08/20/2022 CLINICAL DATA:  Dyspnea. EXAM: CHEST  1 VIEW COMPARISON:  Chest radiograph dated 08/19/2022. FINDINGS: Right-sided dialysis catheter and left AICD device in similar position. There is mild cardiomegaly with vascular congestion and edema relatively similar to prior radiograph. Trace bilateral pleural effusions may be present. No pneumothorax. Atherosclerotic calcification of the aorta. No acute osseous pathology. IMPRESSION: Overall no significant interval change. Electronically Signed   By: Elgie Collard M.D.   On: 08/20/2022 18:18   DG Chest 1 View  Result Date: 08/19/2022 CLINICAL DATA:  Dyspnea EXAM: CHEST  1 VIEW COMPARISON:  X-ray 07/18/2022 and older FINDINGS: Stable double lumen right IJ catheter with tip along the SVC right atrial junction region. Left-sided defibrillator. Overlapping cardiac leads. Stable cardiopericardial silhouette with central vascular  congestion and interstitial changes. No pneumothorax or large effusion. Overlapping cardiac leads. IMPRESSION: No significant interval change when adjusting for technique. Electronically Signed   By: Karen Kays M.D.   On: 08/19/2022 17:10   Medications:   aspirin EC  81 mg Oral Daily   atorvastatin  80 mg Oral QHS   carvedilol  12.5 mg Oral BID WC   Chlorhexidine Gluconate Cloth  6 each Topical Q0600   cinacalcet  30 mg Oral Q T,Th,Sa-HD   fenofibrate  160 mg Oral Daily   gabapentin  300 mg Oral BID   heparin  5,000 Units Subcutaneous Q8H    insulin aspart  0-9 Units Subcutaneous TID WC   insulin glargine-yfgn  12 Units Subcutaneous QHS   ipratropium-albuterol  3 mL Nebulization Q6H   lanthanum  500 mg Oral TID WC   midodrine  10 mg Oral Q T,Th,Sa-HD   mometasone-formoterol  2 puff Inhalation BID   multivitamin  1 tablet Oral Daily   nortriptyline  25 mg Oral QHS    Dialysis Orders: [facility]), TTS, EDW 91.5 kg   Assessment/Plan: 11.  Acute hypoxic respiratory failure: Possibly multifactorial but likely from volume overload in this patient with COPD, pneumonia less likely based on clinical picture. Serial HD, improving but still requiring O2. Na 129 suggesting ongoing volume overload. Will plan for extra HD again tomorrow.  2.  End-stage renal disease: Emergent hemodialysis overnight 8/8 for volume management, HD again 8/10. Weight down 4.4kg so far but not to EDW. HD again tomorrow.  3.  Hypotension: Blood pressure improved with HD, continue carvedilol and midodrine pre-HD 4.  Anemia of chronic disease: Hgb at goal, no Fe or ESA is indicated at this time 5.  Secondary hyperparathyroidism: Calcium and phos controlled. Continue binders and Sensipar   Rogers Blocker, PA-C 08/21/2022, 9:06 AM  [facility] Pager: [phone]

## 2022-08-21 NOTE — Progress Notes (Signed)
   08/21/22 2245  BiPAP/CPAP/SIPAP  Reason BIPAP/CPAP not in use  (pt is resting on [place]. No resp ditress noted)

## 2022-08-22 ENCOUNTER — Inpatient Hospital Stay (HOSPITAL_COMMUNITY): Payer: Medicare Other

## 2022-08-22 DIAGNOSIS — R079 Chest pain, unspecified: Secondary | ICD-10-CM

## 2022-08-22 DIAGNOSIS — I10 Essential (primary) hypertension: Secondary | ICD-10-CM | POA: Diagnosis not present

## 2022-08-22 DIAGNOSIS — J189 Pneumonia, unspecified organism: Secondary | ICD-10-CM | POA: Diagnosis not present

## 2022-08-22 DIAGNOSIS — I5022 Chronic systolic (congestive) heart failure: Secondary | ICD-10-CM | POA: Diagnosis not present

## 2022-08-22 DIAGNOSIS — N186 End stage renal disease: Secondary | ICD-10-CM | POA: Diagnosis not present

## 2022-08-22 LAB — CBC WITH DIFFERENTIAL/PLATELET
Abs Immature Granulocytes: 0.06 10*3/uL (ref 0.00–0.07)
Basophils Absolute: 0.1 10*3/uL (ref 0.0–0.1)
Basophils Relative: 1 %
Eosinophils Absolute: 0.4 10*3/uL (ref 0.0–0.5)
Eosinophils Relative: 4 %
HCT: 32.9 % — ABNORMAL LOW (ref 39.0–52.0)
Hemoglobin: 10.6 g/dL — ABNORMAL LOW (ref 13.0–17.0)
Immature Granulocytes: 1 %
Lymphocytes Relative: 8 %
Lymphs Abs: 0.8 10*3/uL (ref 0.7–4.0)
MCH: 29.2 pg (ref 26.0–34.0)
MCHC: 32.2 g/dL (ref 30.0–36.0)
MCV: 90.6 fL (ref 80.0–100.0)
Monocytes Absolute: 1 10*3/uL (ref 0.1–1.0)
Monocytes Relative: 9 %
Neutro Abs: 8.6 10*3/uL — ABNORMAL HIGH (ref 1.7–7.7)
Neutrophils Relative %: 77 %
Platelets: 141 10*3/uL — ABNORMAL LOW (ref 150–400)
RBC: 3.63 MIL/uL — ABNORMAL LOW (ref 4.22–5.81)
RDW: 15 % (ref 11.5–15.5)
WBC: 11 10*3/uL — ABNORMAL HIGH (ref 4.0–10.5)
nRBC: 0 % (ref 0.0–0.2)

## 2022-08-22 LAB — GLUCOSE, CAPILLARY
Glucose-Capillary: 191 mg/dL — ABNORMAL HIGH (ref 70–99)
Glucose-Capillary: 226 mg/dL — ABNORMAL HIGH (ref 70–99)
Glucose-Capillary: 127 mg/dL — ABNORMAL HIGH (ref 70–99)
Glucose-Capillary: 154 mg/dL — ABNORMAL HIGH (ref 70–99)

## 2022-08-22 MED ORDER — PENTAFLUOROPROP-TETRAFLUOROETH EX AERO: 1.0000 | INHALATION_SPRAY | CUTANEOUS | Status: DC | PRN

## 2022-08-22 MED ORDER — LIDOCAINE HCL (PF) 1 % IJ SOLN: 5.0000 mL | INTRAMUSCULAR | Status: DC | PRN

## 2022-08-22 MED ORDER — PERFLUTREN LIPID MICROSPHERE
1.0000 mL | INTRAVENOUS | Status: AC | PRN
  Administered 2022-08-22: 3 mL via INTRAVENOUS

## 2022-08-22 MED ORDER — ACETAMINOPHEN 325 MG PO TABS
650.0000 mg | ORAL_TABLET | Freq: Four times a day (QID) | ORAL | Status: DC | PRN
  Administered 2022-08-22 – 2022-08-25 (×3): 650 mg via ORAL
  Filled 2022-08-22 (×3): qty 2

## 2022-08-22 MED ORDER — LIDOCAINE-PRILOCAINE 2.5-2.5 % EX CREA: 1.0000 | TOPICAL_CREAM | CUTANEOUS | Status: DC | PRN

## 2022-08-22 MED ORDER — ANTICOAGULANT SODIUM CITRATE 4% (200MG/5ML) IV SOLN: 5.0000 mL | Status: DC | PRN

## 2022-08-22 MED ORDER — HEPARIN SODIUM (PORCINE) 1000 UNIT/ML DIALYSIS: 1000.0000 [IU] | INTRAMUSCULAR | Status: DC | PRN

## 2022-08-22 MED ORDER — ALTEPLASE 2 MG IJ SOLR: 2.0000 mg | Freq: Once | INTRAMUSCULAR | Status: DC | PRN

## 2022-08-22 MED ORDER — HEPARIN SODIUM (PORCINE) 1000 UNIT/ML IJ SOLN
INTRAMUSCULAR | Status: AC
  Administered 2022-08-22: 1000 [IU]
  Filled 2022-08-22: qty 4

## 2022-08-22 NOTE — Progress Notes (Signed)
   08/22/22 1801  Vitals  Temp 98.7 F (37.1 C)  Pulse Rate 81  Resp (!) 22  BP 129/62  SpO2 99 %  O2 Device Nasal Cannula  Oxygen Therapy  O2 Flow Rate (L/min) 3 L/min  Patient Activity (if Appropriate) In bed  Pulse Oximetry Type Continuous  Post Treatment  Dialyzer Clearance Clear  Duration of HD Treatment -hour(s) 3 hour(s)  Hemodialysis Intake (mL) 0 mL  Liters Processed 71.9  Fluid Removed (mL) 2500 mL  Tolerated HD Treatment Yes  Post-Hemodialysis Comments Pt. tolerated procedure without difficulties. UF goal met, VSS and Admin medication. Reported called to 4E bedside RN Angus Palms   Received patient in bed to unit.  Alert and oriented.  Informed consent signed and in chart.   TX duration: 3  Patient tolerated well.  Transported back to the room  Alert, without acute distress.  Hand-off given to patient's nurse.   Access used: Yes Access issues: No   Total UF removed: 2500 Medication(s) given: See MAR Post HD VS: See Above Grid Post HD weight: 88.3 kg   [facility]

## 2022-08-22 NOTE — Progress Notes (Signed)
Echocardiogram 2D Echocardiogram has been performed.  Irish Lack, RDCS 08/22/2022, 9:46 AM

## 2022-08-22 NOTE — Inpatient Diabetes Management (Signed)
Inpatient Diabetes Program Recommendations  AACE/ADA: New Consensus Statement on Inpatient Glycemic Control  Target Ranges:  Prepandial:   less than 140 mg/dL      Peak postprandial:   less than 180 mg/dL (1-2 hours)      Critically ill patients:  140 - 180 mg/dL    Latest Reference Range & Units 08/21/22 06:08 08/21/22 09:36 08/21/22 12:38 08/21/22 16:54 08/21/22 21:09 08/22/22 05:52  Glucose-Capillary 70 - 99 mg/dL 161 (H) 096 (H) 045 (H) 221 (H) 269 (H) 191 (H)   Review of Glycemic Control  Diabetes history: DM2 Outpatient Diabetes medications: Lantus 12 units QHS, Humalog 28 units BID (lunch and supper) Current orders for Inpatient glycemic control: Semglee 12 units at bedtime, Novolog 0-9 units TID with meals  Inpatient Diabetes Program Recommendations:    Insulin: Please consider ordering Novolog 4 units TID with meals for meal coverage if patient eats at least 50% of meals.  Thanks, Orlando Penner, RN, MSN, CDCES Diabetes Coordinator Inpatient Diabetes Program [phone] (Team Pager from 8am to 5pm)

## 2022-08-22 NOTE — Care Management Important Message (Signed)
Important Message  Patient Details  Name: Anthony Bullock MRN: [id number] Date of Birth: 1960-09-12   Medicare Important Message Given:  Yes     [name] 08/22/2022, 9:25 AM

## 2022-08-22 NOTE — Progress Notes (Signed)
Pt came back to rm 12 from HD. Obtained the VS. Reinitiated tele.   Lawson Radar, RN

## 2022-08-22 NOTE — Progress Notes (Signed)
Mobility Specialist Progress Note:   08/22/22 1115  Therapy Vitals  Temp 97.8 F (36.6 C)  Temp Source Oral  Pulse Rate 82  Resp 18  BP (!) 116/59  Oxygen Therapy  SpO2 96 %  O2 Device Nasal Cannula  O2 Flow Rate (L/min) 3 L/min  Mobility  Activity Ambulated with assistance in hallway  Level of Assistance Minimal assist, patient does 75% or more  Assistive Device Front wheel walker  Distance Ambulated (ft) 40 ft  Activity Response Tolerated well  Mobility Referral Yes  $Mobility charge 1 Mobility  Mobility Specialist Start Time (ACUTE ONLY) 1115  Mobility Specialist Stop Time (ACUTE ONLY) 1131  Mobility Specialist Time Calculation (min) (ACUTE ONLY) 16 min    Pre Mobility: 82 HR,  116/59 (76) BP,  96% SpO2 During Mobility: 96 HR,  90% SpO2 Post Mobility:  82 HR, 91% SpO2  Pt received in bed, agreeable to mobility. MinA for bed mobility. Presented some unsteadiness during session, otherwise asymptomatic. Pt left in bed with call bell and all needs met.  D'Vante Earlene Plater Mobility Specialist Please contact via Special educational needs teacher or Rehab office at [phone]

## 2022-08-22 NOTE — Progress Notes (Addendum)
OT Cancellation Note  Patient Details Name: Anthony Bullock MRN: [id number] DOB: 1960/04/04   Cancelled Treatment:    Reason Eval/Treat Not Completed: Other (comment) (pt working with MS)   1440: Reattempted pt off floor for HD  Carver Fila, OTD, OTR/L SecureChat Preferred Acute Rehab [phone]   Dalphine Handing 08/22/2022, 1:03 PM

## 2022-08-22 NOTE — Progress Notes (Signed)
Anthony Bullock Progress Note   Subjective:    Seen and examined patient at bedside. No acute complaints. Denies SOB, CP, palpitations, and N/V. Plan for an extra HD today. Getting closer to his EDW so hopefully can go home soon.  Objective Vitals:   08/22/22 0344 08/22/22 0936 08/22/22 0938 08/22/22 0956  BP: 115/64   (!) 114/57  Pulse: 80   87  Resp: 19   20  Temp: 98 F (36.7 C)   98.3 F (36.8 C)  TempSrc: Oral   Oral  SpO2: 97% 96% 97% 92%  Weight:      Height:       Physical Exam General: Alert male in NAD Heart: RRR, no murmurs, rubs or gallops Lungs: CTA bilaterally but diminished in bases. On 3L O2 via , respirations unlabored Abdomen: Soft, non-distended, +BS Extremities: B/l BKA, no LE edema appreciated Dialysis Access:  [facility]  Filed Weights   08/19/22 0724 08/20/22 0726 08/20/22 1152  Weight: 95.1 kg 95.5 kg 93.2 kg    Intake/Output Summary (Last 24 hours) at 08/22/2022 1114 Last data filed at 08/22/2022 0900 Gross per 24 hour  Intake 240 ml  Output 1 ml  Net 239 ml    Additional Objective Labs: Basic Metabolic Panel: Recent Labs  Lab 08/20/22 0436 08/21/22 0409 08/22/22 0209  NA 129* 130* 130*  K 3.8 3.7 4.0  CL 90* 89* 91*  CO2 23 25 23   GLUCOSE 200* 251* 226*  BUN 40* 32* 54*  CREATININE 9.09* 6.58* 8.79*  CALCIUM 8.0* 8.2* 8.2*  PHOS 3.8  --  5.6*   Liver Function Tests: Recent Labs  Lab 08/18/22 2125 08/20/22 0436 08/22/22 0209  AST 25  --   --   ALT 21  --   --   ALKPHOS 51  --   --   BILITOT 0.8  --   --   PROT 6.6  --   --   ALBUMIN 2.9* 2.4* 2.3*   No results for input(s): "LIPASE", "AMYLASE" in the last 168 hours. CBC: Recent Labs  Lab 08/18/22 2125 08/20/22 0436 08/21/22 0409  WBC 14.5* 12.7* 11.5*  NEUTROABS 12.7* 9.8*  --   HGB 14.2 11.3* 11.0*  HCT 43.1 34.5* 34.1*  MCV 92.1 92.5 93.2  PLT PLATELET CLUMPS NOTED ON SMEAR, UNABLE TO ESTIMATE 114* 119*   Blood Culture    Component Value Date/Time    SDES BLOOD SITE NOT SPECIFIED 08/18/2022 2130   SPECREQUEST  08/18/2022 2130    BOTTLES DRAWN AEROBIC AND ANAEROBIC Blood Culture adequate volume   CULT  08/18/2022 2130    NO GROWTH 4 DAYS Performed at [facility], [address] [place], [place] [address]    REPTSTATUS PENDING 08/18/2022 2130    Cardiac Enzymes: No results for input(s): "CKTOTAL", "CKMB", "CKMBINDEX", "TROPONINI" in the last 168 hours. CBG: Recent Labs  Lab 08/21/22 0936 08/21/22 1238 08/21/22 1654 08/21/22 2109 08/22/22 0552  GLUCAP 314* 231* 221* 269* 191*   Iron Studies: No results for input(s): "IRON", "TIBC", "TRANSFERRIN", "FERRITIN" in the last 72 hours. Lab Results  Component Value Date   INR 1.2 08/05/2022   INR 1.1 08/17/2019   INR 1.0 11/06/2017   Studies/Results: DG Chest 1 View  Result Date: 08/20/2022 CLINICAL DATA:  Dyspnea. EXAM: CHEST  1 VIEW COMPARISON:  Chest radiograph dated 08/19/2022. FINDINGS: Right-sided dialysis catheter and left AICD device in similar position. There is mild cardiomegaly with vascular congestion and edema relatively similar to  prior radiograph. Trace bilateral pleural effusions may be present. No pneumothorax. Atherosclerotic calcification of the aorta. No acute osseous pathology. IMPRESSION: Overall no significant interval change. Electronically Signed   By: Elgie Collard M.D.   On: 08/20/2022 18:18    Medications:   aspirin EC  81 mg Oral Daily   atorvastatin  80 mg Oral QHS   carvedilol  12.5 mg Oral BID WC   Chlorhexidine Gluconate Cloth  6 each Topical Q0600   cinacalcet  30 mg Oral Q T,Th,Sa-HD   fenofibrate  160 mg Oral Daily   gabapentin  300 mg Oral BID   heparin  5,000 Units Subcutaneous Q8H   insulin aspart  0-9 Units Subcutaneous TID WC   insulin glargine-yfgn  12 Units Subcutaneous QHS   lanthanum  500 mg Oral TID WC   midodrine  10 mg Oral Q T,Th,Sa-HD   mometasone-formoterol  2 puff Inhalation BID   multivitamin  1 tablet Oral  Daily   nortriptyline  25 mg Oral QHS    Dialysis Orders: [facility] TTS, EDW 91.5 kg   Assessment/Plan: 11.  Acute hypoxic respiratory failure: Possibly multifactorial but likely from volume overload in this patient with COPD, pneumonia less likely based on clinical picture. Serial HD, improving but still requiring O2. Na 129 suggesting ongoing volume overload. Will plan for extra HD again today. Next HD 8/13 per his usual schedule. 2.  End-stage renal disease: Emergent hemodialysis overnight 8/8 for volume management, HD again 8/10. Weight down 4.4kg so far but not to EDW. HD again today for an extra HD then tomorrow per his usual schedule.  3.  Hypotension: Blood pressure improved with HD, continue carvedilol and midodrine pre-HD 4.  Anemia of chronic disease: Hgb at goal, no Fe or ESA is indicated at this time 5.  Secondary hyperparathyroidism: Calcium and phos controlled. Continue binders and Sensipar  6. Will assess to see how he feels after his extra treatment today. Should probably get HD tomorrow as well here since that's his routine HD day. Will be able to go home once his volume is stable.  [name], NP [facility] 08/22/2022,11:14 AM  LOS: 4 days

## 2022-08-22 NOTE — Progress Notes (Signed)
Progress Note   Patient: Anthony Bullock:[id number] DOB: 03/05/1960 DOA: 08/18/2022     4 DOS: the patient was seen and examined on 08/22/2022   Brief hospital course: Anthony Bullock was admitted to the hospital with the working diagnosis of volume overload.   62 yo male with the past medical history of hypertension, hyperlipidemia, T2DM, bilateral BKA, and ESRD on HD who presented with dyspnea and cough. Patient developed acute dyspnea and cough. He went to his outpatient HD unit, [facility] Bull[facility]) he was found hypoxic, 02 saturation 70% on room air. He was placed on supplemental 02 per non re-brether 15L and was transferred to the ED. He reports not being compliant with salt or fluid restrictions at home.  In the ED his blood pressure was 139/71, HR 101, RR 20 and 02 saturation 97%, lungs with bilateral rhonchi, with no wheezing, heart with S1 and S2 present and regular with no gallops, abdomen with no distention, bilateral BKA.   Na 134, K 5,0 Cl 97, bicarbonate 20, glucose 226 bun 66 cr 11,7 Anion gap 17  BNP 1,158  High sensitive troponin 674 and 702  Lactic acid 1,2 and 2,9  Wbc 14,5 hgb 14.2   Chest radiograph from [facility] with reported pulmonary edema and small pleural effusions.   EKG 104 bpm, left axis deviation, left bundle branch block, sinus rhythm with left atrial enlargement, no significant ST segment or T wave changes.   08/08 Nephrology was consulted and recommendations for emergent hemodialysis.  08/12 had hemodialysis with ultrafiltration on consecutive days, plan for HD today and then tomorrow per his regular schedule, if volume back to baseline possible dc home tomorrow.    Assessment and Plan: * ESRD on dialysis (HCC) Volume overload. Hyponatremia.   Acute hypoxemic respiratory failure.  Follow up chest radiograph with improvement in infiltrates but continue to have features of pulmonary edema.   Dyspnea and 02 requirements improving, today is on 3 L/min per Anthony Bullock with 02  saturation 99%.   No further hemoptysis.   Na 130, K 4,0 and serum bicarbonate at 23,  Plan for HD today with ultrafiltration, and back to his regular schedule tomorrow.   Anemia of chronic renal disease.  Hgb has been stable.   Metabolic bone disease, continue with cinacalcet.   Chronic systolic congestive heart failure (HCC) Echocardiogram from 2021 with preserved LV systolic function EF 50%, moderate LVH, RV systolic function preserved, LA with mild dilatation, no significant valvular disease.   Continue carvedilol.  Blood pressure support with midodrine on HD days.  Volume status is improving.   Pneumonia due to infectious organism Pneumonia has been ruled out. .  Patient with no fever.  Wbc mild elevation,  Follow up chest radiograph with no signs of pneumonia.  Continue to hold on antibiotic therapy.   Essential hypertension Continue blood pressure control with carvedilol On HD he has been on midodrine.   Type 2 diabetes mellitus with hyperlipidemia (HCC) Uncontrolled with hyperglycemia.   Continue insulin sliding scale for glucose cover and monitoring. Basal insulin 12 units Patient is tolerating po well.  Fasting glucose today is 226 mg/dl.   COPD (chronic obstructive pulmonary disease) (HCC) Continue with bronchodilator therapy and inhaled corticosteroids  Air way clearing techniques with flutter valve and incentive spirometer.   Adjustment disorder with depressed mood Continue with nortriptyline.   CAD (coronary artery disease) No acute coronary syndrome. Continue blood pressure control.   S/P bilateral below knee amputation (HCC) He has prosthesis for ambulation.  Subjective: Patient with improvement in dyspnea, improved cough, no further hemoptysis.   Physical Exam: Vitals:   08/22/22 0956 08/22/22 1115 08/22/22 1419 08/22/22 1429  BP: (!) 114/57 (!) 116/59 (!) 125/58 121/76  Pulse: 87 82 84 81  Resp: 20 18 16  (!) 30  Temp: 98.3 F  (36.8 C) 97.8 F (36.6 C) 98.2 F (36.8 C)   TempSrc: Oral Oral Oral   SpO2: 92% 96% 95% 98%  Weight:   91.1 kg   Height:       Neurology awake and alert ENT with mild pallor Cardiovascular with S1 and S2 present with no gallops, rubs or murmurs Respiratory with mild rales with no wheezing or rhonchi Abdomen with no distention  Bilateral BKA with no peripheral edema  Data Reviewed:    Family Communication: no family at the bedside   Disposition: Status is: Inpatient Remains inpatient appropriate because: inpatient hemodialysis with ultrafiltration   Planned Discharge Destination: Home      Author: Coralie Keens, MD 08/22/2022 2:29 PM  For on call review [email or web address].

## 2022-08-23 DIAGNOSIS — I10 Essential (primary) hypertension: Secondary | ICD-10-CM | POA: Diagnosis not present

## 2022-08-23 DIAGNOSIS — N186 End stage renal disease: Secondary | ICD-10-CM | POA: Diagnosis not present

## 2022-08-23 DIAGNOSIS — I4891 Unspecified atrial fibrillation: Secondary | ICD-10-CM | POA: Diagnosis not present

## 2022-08-23 DIAGNOSIS — I5022 Chronic systolic (congestive) heart failure: Secondary | ICD-10-CM | POA: Diagnosis not present

## 2022-08-23 LAB — GLUCOSE, CAPILLARY
Glucose-Capillary: 160 mg/dL — ABNORMAL HIGH (ref 70–99)
Glucose-Capillary: 293 mg/dL — ABNORMAL HIGH (ref 70–99)
Glucose-Capillary: 143 mg/dL — ABNORMAL HIGH (ref 70–99)

## 2022-08-23 MED ORDER — METOPROLOL TARTRATE 5 MG/5ML IV SOLN
5.0000 mg | Freq: Once | INTRAVENOUS | Status: AC
  Administered 2022-08-23: 2.5 mg via INTRAVENOUS
  Filled 2022-08-23: qty 5

## 2022-08-23 MED ORDER — METOPROLOL TARTRATE 5 MG/5ML IV SOLN: 2.5000 mg | INTRAVENOUS | Status: DC | PRN

## 2022-08-23 MED ORDER — DILTIAZEM HCL-DEXTROSE 125-5 MG/125ML-% IV SOLN (PREMIX)
5.0000 mg/h | INTRAVENOUS | Status: DC
  Administered 2022-08-23: 5 mg/h via INTRAVENOUS
  Administered 2022-08-23: 10 mg/h via INTRAVENOUS
  Administered 2022-08-23 – 2022-08-24 (×2): 15 mg/h via INTRAVENOUS
  Filled 2022-08-23 (×5): qty 125

## 2022-08-23 MED ORDER — HEPARIN SODIUM (PORCINE) 1000 UNIT/ML IJ SOLN
INTRAMUSCULAR | Status: AC
  Filled 2022-08-23: qty 4

## 2022-08-23 MED ORDER — METOCLOPRAMIDE HCL 5 MG/ML IJ SOLN
5.0000 mg | Freq: Once | INTRAMUSCULAR | Status: AC
  Administered 2022-08-23: 5 mg via INTRAVENOUS
  Filled 2022-08-23: qty 2

## 2022-08-23 MED ORDER — CARVEDILOL 25 MG PO TABS
25.0000 mg | ORAL_TABLET | Freq: Two times a day (BID) | ORAL | Status: DC
  Administered 2022-08-24 – 2022-08-26 (×3): 25 mg via ORAL
  Filled 2022-08-23 (×3): qty 1

## 2022-08-23 NOTE — Progress Notes (Signed)
Pt has PRN BIPAP orders, no distress noted at this time.  °

## 2022-08-23 NOTE — Progress Notes (Signed)
Patient had 5 mg IV metoprolol, current rate is 120's with systolic blood pressure above 295 mmHg. No chest pain or dyspnea.   Plan to add diltiazem drip for rate control.  If no further hemoptysis will plan to start anticoagulation in am.  Continue telemetry monitoring,

## 2022-08-23 NOTE — Assessment & Plan Note (Addendum)
New onset atrial fibrillation, with RVR during HD.   Plan to add IV metoprolol 5 mg for rate control x 1 dose. Patient on carvedilol to 12,5 mg po bid. His Cha2 ds2 vasc score is 3. Considering recent hemoptysis will hold on anticoagulation for now.

## 2022-08-23 NOTE — Progress Notes (Signed)
PT Cancellation Note  Patient Details Name: Anthony Bullock MRN: [id number] DOB: 1960/05/01   Cancelled Treatment:    Reason Eval/Treat Not Completed: (P) Patient declined, no reason specified (Pt reporting feeling poorly and declines mobility. Will follow up as schedule allows.)   Johny Shock 08/23/2022, 9:47 AM

## 2022-08-23 NOTE — Progress Notes (Signed)
Pt refused taking lopressor po. MD notified.   Lawson Radar, RN

## 2022-08-23 NOTE — Progress Notes (Addendum)
[facility] Progress Note   Subjective:    Seen and examined patient on HD. Tolerated extra HD yesterday with net UF 2.5L. On HD per his routine schedule, tolerating UFG 2L. No issues. O2 requirement is weaning down as well. Appears he left under his EDW yesterday after HD at 88.3kg. I think he should be able to go home after HD today.  Objective Vitals:   08/23/22 1317 08/23/22 1325 08/23/22 1329 08/23/22 1331  BP:  (!) 150/58 (!) 174/64   Pulse:  99 96 95  Resp:  20 (!) 30 (!) 29  Temp:  98.7 F (37.1 C)    TempSrc:      SpO2:  99% 96% 91%  Weight: (S) 88.9 kg     Height:       Physical Exam General: Alert male in NAD Heart: RRR, no murmurs, rubs or gallops Lungs: CTA bilaterally but diminished in bases. On 1L O2 via [place], respirations unlabored Abdomen: Soft, non-distended, +BS Extremities: B/l BKA, no LE edema appreciated Dialysis Access:  [facility]  Filed Weights   08/22/22 1419 08/22/22 1800 08/23/22 1317  Weight: 91.1 kg (S) 88.3 kg (S) 88.9 kg    Intake/Output Summary (Last 24 hours) at 08/23/2022 1433 Last data filed at 08/23/2022 0815 Gross per 24 hour  Intake 480 ml  Output 2500 ml  Net -2020 ml    Additional Objective Labs: Basic Metabolic Panel: Recent Labs  Lab 08/20/22 0436 08/21/22 0409 08/22/22 0209 08/23/22 0130  NA 129* 130* 130* 132*  K 3.8 3.7 4.0 3.6  CL 90* 89* 91* 93*  CO2 23 25 23 25   GLUCOSE 200* 251* 226* 180*  BUN 40* 32* 54* 34*  CREATININE 9.09* 6.58* 8.79* 6.63*  CALCIUM 8.0* 8.2* 8.2* 8.4*  PHOS 3.8  --  5.6* 3.6   Liver Function Tests: Recent Labs  Lab 08/18/22 2125 08/20/22 0436 08/22/22 0209 08/23/22 0130  AST 25  --   --   --   ALT 21  --   --   --   ALKPHOS 51  --   --   --   BILITOT 0.8  --   --   --   PROT 6.6  --   --   --   ALBUMIN 2.9* 2.4* 2.3* 2.3*   No results for input(s): "LIPASE", "AMYLASE" in the last 168 hours. CBC: Recent Labs  Lab 08/18/22 2125 08/20/22 0436 08/21/22 0409  08/22/22 0230  WBC 14.5* 12.7* 11.5* 11.0*  NEUTROABS 12.7* 9.8*  --  8.6*  HGB 14.2 11.3* 11.0* 10.6*  HCT 43.1 34.5* 34.1* 32.9*  MCV 92.1 92.5 93.2 90.6  PLT PLATELET CLUMPS NOTED ON SMEAR, UNABLE TO ESTIMATE 114* 119* 141*   Blood Culture    Component Value Date/Time   SDES BLOOD SITE NOT SPECIFIED 08/18/2022 2130   SPECREQUEST  08/18/2022 2130    BOTTLES DRAWN AEROBIC AND ANAEROBIC Blood Culture adequate volume   CULT  08/18/2022 2130    NO GROWTH 5 DAYS Performed at [facility], [address], [place], [place] [address]    REPTSTATUS 08/23/2022 FINAL 08/18/2022 2130    Cardiac Enzymes: No results for input(s): "CKTOTAL", "CKMB", "CKMBINDEX", "TROPONINI" in the last 168 hours. CBG: Recent Labs  Lab 08/22/22 1147 08/22/22 1833 08/22/22 2125 08/23/22 0629 08/23/22 1215  GLUCAP 226* 127* 154* 160* 293*   Iron Studies: No results for input(s): "IRON", "TIBC", "TRANSFERRIN", "FERRITIN" in the last 72 hours. Lab Results  Component Value  Date   INR 1.2 08/05/2022   INR 1.1 08/17/2019   INR 1.0 11/06/2017   Studies/Results: ECHOCARDIOGRAM COMPLETE  Result Date: 08/22/2022    ECHOCARDIOGRAM REPORT   Patient Name:   ABHIJOT ARMACOST [facility] Date of Exam: 08/22/2022 Medical Rec #:  [id number]     Height:       71.0 in Accession #:    [id number]    Weight:       205.5 lb Date of Birth:  1960-08-25     BSA:          2.133 m Patient Age:    61 years      BP:           115/64 mmHg Patient Gender: M             HR:           84 bpm. Exam Location:  Inpatient Procedure: 2D Echo, Color Doppler, Cardiac Doppler and Intracardiac            Opacification Agent Indications:    Dyspnea  History:        Patient has prior history of Echocardiogram examinations, most                 recent 09/03/2019. Cardiomyopathy and CHF, CAD, COPD; Risk                 Factors:Former Smoker, Diabetes, Dyslipidemia and Hypertension.                 ESRD. History of Cardiac Catheterization.  Sonographer:    Raeford Razor RDCS Referring Phys: [id number] MAURICIO DANIEL ARRIEN IMPRESSIONS  1. Left ventricular ejection fraction, by estimation, is 30 to 35%. The left ventricle has moderately decreased function. The left ventricle demonstrates global hypokinesis. There is moderate concentric left ventricular hypertrophy. Left ventricular diastolic parameters are consistent with Grade II diastolic dysfunction (pseudonormalization). Elevated left ventricular end-diastolic pressure.  2. Right ventricular systolic function is normal. The right ventricular size is normal.  3. Left atrial size was mildly dilated.  4. The mitral valve is normal in structure. Moderate mitral valve regurgitation. No evidence of mitral stenosis.  5. The aortic valve is normal in structure. Aortic valve regurgitation is not visualized. Aortic valve sclerosis is present, with no evidence of aortic valve stenosis.  6. The inferior vena cava is normal in size with greater than 50% respiratory variability, suggesting right atrial pressure of 3 mmHg. FINDINGS  Left Ventricle: Left ventricular ejection fraction, by estimation, is 30 to 35%. The left ventricle has moderately decreased function. The left ventricle demonstrates global hypokinesis. Definity contrast agent was given IV to delineate the left ventricular endocardial borders. The left ventricular internal cavity size was normal in size. There is moderate concentric left ventricular hypertrophy. Left ventricular diastolic parameters are consistent with Grade II diastolic dysfunction (pseudonormalization). Elevated left ventricular end-diastolic pressure. Right Ventricle: The right ventricular size is normal. No increase in right ventricular wall thickness. Right ventricular systolic function is normal. Left Atrium: Left atrial size was mildly dilated. Right Atrium: Right atrial size was normal in size. Pericardium: There is no evidence of pericardial effusion. Presence of epicardial fat layer. Mitral Valve:  The mitral valve is normal in structure. Moderate mitral valve regurgitation. No evidence of mitral valve stenosis. Tricuspid Valve: The tricuspid valve is normal in structure. Tricuspid valve regurgitation is not demonstrated. No evidence of tricuspid stenosis. Aortic Valve: The aortic valve is normal in structure. Aortic valve regurgitation  is not visualized. Aortic valve sclerosis is present, with no evidence of aortic valve stenosis. Aortic valve peak gradient measures 2.4 mmHg. Pulmonic Valve: The pulmonic valve was not well visualized. Pulmonic valve regurgitation is trivial. No evidence of pulmonic stenosis. Aorta: The aortic root is normal in size and structure. Venous: The inferior vena cava is normal in size with greater than 50% respiratory variability, suggesting right atrial pressure of 3 mmHg. IAS/Shunts: No atrial level shunt detected by color flow Doppler.  LEFT VENTRICLE PLAX 2D LVIDd:         6.60 cm      Diastology LVIDs:         5.40 cm      LV e' medial:    5.66 cm/s LV PW:         1.40 cm      LV E/e' medial:  20.0 LV IVS:        1.40 cm      LV e' lateral:   6.53 cm/s LVOT diam:     2.10 cm      LV E/e' lateral: 17.3 LV SV:         48 LV SV Index:   22 LVOT Area:     3.46 cm  LV Volumes (MOD) LV vol d, MOD A2C: 110.0 ml LV vol d, MOD A4C: 195.0 ml LV vol s, MOD A2C: 71.9 ml LV vol s, MOD A4C: 134.0 ml LV SV MOD A2C:     38.1 ml LV SV MOD A4C:     195.0 ml LV SV MOD BP:      48.3 ml RIGHT VENTRICLE            IVC RV Basal diam:  2.70 cm    IVC diam: 2.00 cm RV S prime:     6.96 cm/s TAPSE (M-mode): 2.9 cm LEFT ATRIUM             Index        RIGHT ATRIUM           Index LA diam:        3.70 cm 1.73 cm/m   RA Area:     12.50 cm LA Vol (A2C):   82.8 ml 38.82 ml/m  RA Volume:   25.70 ml  12.05 ml/m LA Vol (A4C):   62.4 ml 29.26 ml/m LA Biplane Vol: 73.0 ml 34.23 ml/m  AORTIC VALVE AV Area (Vmax): 3.13 cm AV Vmax:        77.00 cm/s AV Peak Grad:   2.4 mmHg LVOT Vmax:      69.60 cm/s LVOT  Vmean:     47.700 cm/s LVOT VTI:       0.138 m  AORTA Ao Root diam: 2.70 cm Ao Asc diam:  3.30 cm MITRAL VALVE MV Area (PHT): 6.37 cm     SHUNTS MV Decel Time: 119 msec     Systemic VTI:  0.14 m MR PISA:        1.57 cm    Systemic Diam: 2.10 cm MR PISA Radius: 0.50 cm MV E velocity: 113.00 cm/s MV A velocity: 72.30 cm/s MV E/A ratio:  1.56 Kardie Tobb DO Electronically signed by Thomasene Ripple DO Signature Date/Time: 08/22/2022/3:51:04 PM    Final     Medications:   aspirin EC  81 mg Oral Daily   atorvastatin  80 mg Oral QHS   carvedilol  12.5 mg Oral BID WC   Chlorhexidine Gluconate Cloth  6 each Topical  N5621   cinacalcet  30 mg Oral Q T,Th,Sa-HD   fenofibrate  160 mg Oral Daily   gabapentin  300 mg Oral BID   heparin  5,000 Units Subcutaneous Q8H   insulin aspart  0-9 Units Subcutaneous TID WC   insulin glargine-yfgn  12 Units Subcutaneous QHS   lanthanum  500 mg Oral TID WC   midodrine  10 mg Oral Q T,Th,Sa-HD   mometasone-formoterol  2 puff Inhalation BID   multivitamin  1 tablet Oral Daily   nortriptyline  25 mg Oral QHS    Dialysis Orders: [facility] TTS, EDW 91.5 kg   Assessment/Plan: 1.  Acute hypoxic respiratory failure: Possibly multifactorial but likely from volume overload in this patient with COPD, pneumonia less likely based on clinical picture. Serial HD, improving and O2 requirement is going down. Na 132 after extra HD yesterday. Next HD today per his usual schedule. 2.  End-stage renal disease: Emergent hemodialysis overnight 8/8 for volume management, HD again 8/10. Weight down 4.4kg so far but not to EDW. Received extra HD 8/12-now under EDW. On HD per his routine schedule. Will need to lower EDW at dc. 3.  Hypotension: Blood pressure improved with HD, continue carvedilol and midodrine pre-HD 4.  Anemia of chronic disease: Hgb at goal, no Fe or ESA is indicated at this time 5.  Secondary hyperparathyroidism: Calcium and phos controlled. Continue binders and  Sensipar  6. Dispo - Patient is feeling better, volume is improving after extra treatment yesterday and O2 requirement is weaning down-now at 1L [place]. If his O2 saturations remains stable after HD and on RA, he should be able to go home for a renal standpoint.  ADDENDUM 08/23/22 at 3:33pm: Informed by HD RN of patient converting to Afib with RVR. Returned to HD unit to assess patient. Currently, the only symptom patient is c/o is weakness. Denies SOB, CP, palpitations, and N/V. Ordered 500cc NS bolus and STAT EKG. EKG confirmed Afib with RVR. HR ranging 110-130s. I discussed with primary team Dr. Ella Jubilee. Metoprolol Tartrate 5mg  IV X 1 ordered. Per Primary, plan to consult Cardiology. Discharge cancelled for today- Dareen Piano, NP  [name], NP [facility] 08/23/2022,2:33 PM  LOS: 5 days

## 2022-08-23 NOTE — Progress Notes (Addendum)
OT Cancellation Note  Patient Details Name: Anthony Bullock MRN: [id number] DOB: Jan 16, 1960   Cancelled Treatment:    Reason Eval/Treat Not Completed: Patient declined, no reason specified  1324: pt now off unit in HD  Carver Fila, OTD, OTR/L SecureChat [facility] [phone]   Dalphine Handing 08/23/2022, 8:33 AM

## 2022-08-23 NOTE — Progress Notes (Signed)
   08/23/22 1716  Vitals  Pulse Rate (!) 120  Resp 17  BP (!) 134/51  SpO2 96 %  O2 Device Nasal Cannula  Oxygen Therapy  O2 Flow Rate (L/min) 1 L/min  Patient Activity (if Appropriate) In bed  Pulse Oximetry Type Continuous  Post Treatment  Dialyzer Clearance Clear  Hemodialysis Intake (mL) 0 mL  Liters Processed 67.7  Fluid Removed (mL) -200 mL  Tolerated HD Treatment Yes  Post-Hemodialysis Comments Pt. became A-fib from SR and PA Anderson order EKG and instruction to  give the pt 500 NS bolus and discontinued the pt. treatment.  Pt remains asyptomatic but a liitle weak, states, the pt. Medication admin per. order. Report called to 4E bedside RN Angus Palms.   Received patient in bed to unit.  Alert and oriented.  Informed consent signed and in chart.   TX duration:2: 45  Patient tolerated well.  Transported back to the room  Alert, without acute distress.  Hand-off given to patient's nurse.   Access used: Yes Access issues: No   Total UF removed: -200 Medication(s) given: See MAR Post HD VS: See Above Grid Post HD weight: 89.2 kg   [facility]

## 2022-08-23 NOTE — Progress Notes (Signed)
Heart Failure Navigator Progress Note  Assessed for Heart & Vascular TOC clinic readiness.  Patient does not meet criteria due to ESRD on hemodialysis.   Navigator will sign off at this time.    Dawn Fields, BSN, RN Heart Failure Nurse Navigator Secure Chat Only   

## 2022-08-23 NOTE — Progress Notes (Addendum)
Progress Note   Patient: Anthony Bullock ZOX:[id number] DOB: [birth year] DOA: 08/18/2022     5 DOS: the patient was seen and examined on 08/23/2022   Brief hospital course: Anthony Bullock was admitted to the hospital with the working diagnosis of volume overload.   62 yo male with the past medical history of hypertension, hyperlipidemia, T2DM, bilateral BKA, and ESRD on HD who presented with dyspnea and cough. Patient developed acute dyspnea and cough. He went to his outpatient HD unit, [facility] [place]) he was found hypoxic, 02 saturation 70% on room air. He was placed on supplemental 02 per non re-brether 15L and was transferred to the ED. He reports not being compliant with salt or fluid restrictions at home.  In the ED his blood pressure was 139/71, HR 101, RR 20 and 02 saturation 97%, lungs with bilateral rhonchi, with no wheezing, heart with S1 and S2 present and regular with no gallops, abdomen with no distention, bilateral BKA.   Na 134, K 5,0 Cl 97, bicarbonate 20, glucose 226 bun 66 cr 11,7 Anion gap 17  BNP 1,158  High sensitive troponin 674 and 702  Lactic acid 1,2 and 2,9  Wbc 14,5 hgb 14.2   Chest radiograph from [facility] with reported pulmonary edema and small pleural effusions.   EKG 104 bpm, left axis deviation, left bundle branch block, sinus rhythm with left atrial enlargement, no significant ST segment or T wave changes.   08/08 Nephrology was consulted and recommendations for emergent hemodialysis.  08/12 had hemodialysis with ultrafiltration on consecutive days. 08/13 patient developed atrial fibrillation during HD. Treatment has held and patient had IV fluid bolus.   Assessment and Plan: * ESRD on dialysis (HCC) Volume overload. Hyponatremia.   Acute hypoxemic respiratory failure.  Follow up chest radiograph with improvement in infiltrates but continue to have features of pulmonary edema.   Dyspnea and 02 requirements improving, today is on 1 L/min per [place] with 02 saturation  91%.   No further hemoptysis.   Na 132, K 3.6 and serum bicarbonate at 25  HD today, with 1,100 ml ultrafiltration,   Anemia of chronic renal disease.  Hgb has been stable.   Metabolic bone disease, continue with cinacalcet.   Chronic systolic congestive heart failure (HCC) Echocardiogram from 2021 with preserved LV systolic function EF 50%, moderate LVH, RV systolic function preserved, LA with mild dilatation, no significant valvular disease.   Blood pressure support with midodrine on HD days.  Volume status is improving.   Atrial fibrillation (HCC) New onset atrial fibrillation, with RVR during HD.   Plan to add IV metoprolol 5 mg for rate control x 1 dose. Patient on carvedilol to 12,5 mg po bid. His Cha2 ds2 vasc score is 3. Considering recent hemoptysis will hold on anticoagulation for now.   Pneumonia due to infectious organism Pneumonia has been ruled out. .  Patient with no fever.  Wbc mild elevation,  Follow up chest radiograph with no signs of pneumonia.  Continue to hold on antibiotic therapy.   Essential hypertension Continue blood pressure control with carvedilol On HD he has been on midodrine.   Type 2 diabetes mellitus with hyperlipidemia (HCC) Uncontrolled with hyperglycemia.   Continue insulin sliding scale for glucose cover and monitoring. Basal insulin 12 units Patient is tolerating po well.  Fasting glucose today is 180 mg/dl.   COPD (chronic obstructive pulmonary disease) (HCC) Continue with bronchodilator therapy and inhaled corticosteroids  Air way clearing techniques with flutter valve and incentive spirometer.  Adjustment disorder with depressed mood Continue with nortriptyline.   CAD (coronary artery disease) No acute coronary syndrome. Continue blood pressure control.   S/P bilateral below knee amputation (HCC) He has prosthesis for ambulation.     Subjective: patient during HD converted to atrial fibrillation with RVR,  feeling weak, not chest pain, dyspnea or palpitations   Physical Exam: Vitals:   08/23/22 1317 08/23/22 1325 08/23/22 1329 08/23/22 1331  BP:  (!) 150/58 (!) 174/64   Pulse:  99 96 95  Resp:  20 (!) 30 (!) 29  Temp:  98.7 F (37.1 C)    TempSrc:      SpO2:  99% 96% 91%  Weight: (S) 88.9 kg     Height:       Neurology awake and alert ENT with mild pallor Cardiovascular with S1 and S2 present, irregularly irregular with no gallops, rubs or murmurs Respiratory with no rales or wheezing, no rhonchi Abdomen with no distention  No peripheral edema  Data Reviewed:    Family Communication: no family at the bedside   Disposition: Status is: Inpatient Remains inpatient appropriate because: atrial fibrillation   Planned Discharge Destination: Home    Author: Coralie Keens, MD 08/23/2022 3:28 PM  For on call review [email or web address].

## 2022-08-23 NOTE — Progress Notes (Signed)
Pt came back to rm 12 from HD with afib RVR. Reinitiated tele. Obtained VS. Initiated cardizem drip.   Lawson Radar, RN

## 2022-08-24 DIAGNOSIS — I10 Essential (primary) hypertension: Secondary | ICD-10-CM | POA: Diagnosis not present

## 2022-08-24 DIAGNOSIS — I4891 Unspecified atrial fibrillation: Secondary | ICD-10-CM | POA: Diagnosis not present

## 2022-08-24 DIAGNOSIS — I5022 Chronic systolic (congestive) heart failure: Secondary | ICD-10-CM | POA: Diagnosis not present

## 2022-08-24 DIAGNOSIS — I2510 Atherosclerotic heart disease of native coronary artery without angina pectoris: Secondary | ICD-10-CM | POA: Diagnosis not present

## 2022-08-24 DIAGNOSIS — Z992 Dependence on renal dialysis: Secondary | ICD-10-CM | POA: Diagnosis not present

## 2022-08-24 DIAGNOSIS — N186 End stage renal disease: Secondary | ICD-10-CM | POA: Diagnosis not present

## 2022-08-24 LAB — GLUCOSE, CAPILLARY
Glucose-Capillary: 161 mg/dL — ABNORMAL HIGH (ref 70–99)
Glucose-Capillary: 278 mg/dL — ABNORMAL HIGH (ref 70–99)
Glucose-Capillary: 217 mg/dL — ABNORMAL HIGH (ref 70–99)
Glucose-Capillary: 194 mg/dL — ABNORMAL HIGH (ref 70–99)

## 2022-08-24 MED ORDER — LACTATED RINGERS IV BOLUS
250.0000 mL | Freq: Once | INTRAVENOUS | Status: AC
  Administered 2022-08-24: 250 mL via INTRAVENOUS

## 2022-08-24 MED ORDER — HEPARIN (PORCINE) 25000 UT/250ML-% IV SOLN
1400.0000 [IU]/h | INTRAVENOUS | Status: DC
  Administered 2022-08-24: 1200 [IU]/h via INTRAVENOUS
  Administered 2022-08-25: 1400 [IU]/h via INTRAVENOUS
  Filled 2022-08-24 (×2): qty 250

## 2022-08-24 NOTE — Progress Notes (Signed)
ANTICOAGULATION CONSULT NOTE - Initial Consult  Pharmacy Consult for heparin gtt Indication: New onset atrial fibrillation  Allergies  Allergen Reactions   Epoetin Alfa Other (See Comments)    unknown   Ferumoxytol Other (See Comments)    unknown   Morphine Sulfate Rash and Other (See Comments)    Itches all over, red spots    Patient Measurements: Height: 5\' 11"  (180.3 cm) Weight: (S) 89.2 kg (196 lb 10.4 oz) (Bed Scale) IBW/kg (Calculated) : 75.3 Heparin Dosing Weight: 89.2 kg  Vital Signs: Temp: 97.7 F (36.5 C) (08/14 1543) Temp Source: Oral (08/14 1543) BP: 122/42 (08/14 1654) Pulse Rate: 74 (08/14 1654)  Labs: Recent Labs    08/22/22 0209 08/22/22 0230 08/23/22 0130 08/24/22 0115  HGB  --  10.6*  --   --   HCT  --  32.9*  --   --   PLT  --  141*  --   --   CREATININE 8.79*  --  6.63* 5.02*    Estimated Creatinine Clearance: 16.7 mL/min (A) (by C-G formula based on SCr of 5.02 mg/dL (H)).   Medical History: Past Medical History:  Diagnosis Date   AICD (automatic cardioverter/defibrillator) present    [employer]   Allergic rhinitis    Anemia    Arthritis    Chronic systolic heart failure (HCC)    a. ECHO (12/2012) EF 25-30%, HK entireanteroseptal myocardium //  b.  EF 25%, diffuse HK, grade 1 diastolic dysfunction, MAC, mild LAE, normal RVSF, trivial pericardial effusion   COPD (chronic obstructive pulmonary disease) (HCC)    Diabetes mellitus type II    Diabetic nephropathy (HCC)    Diabetic neuropathy (HCC)    ESRD on hemodialysis (HCC)    started HD June 2017, goes to [facility] HD unit, Dr Fausto Skillern   History of cardiac catheterization    a.Myoview 1/15:  There is significant left ventricular dysfunction. There may be slight scar at the apex. There is no significant ischemia. LV Ejection Fraction: 27%  //  b. RHC/LHC (1/15) with mean RA 6, PA 47/22 mean 33, mean PCWP 20, PVR 2.5 WU, CI 2.5; 80% dLAD stenosis, 70% diffuse large D.      History of kidney stones    Hyperlipidemia    Hypertension    Kidney stones    NICM (nonischemic cardiomyopathy) (HCC)    Primarily nonischemic.  Echo (12/14) with EF 25-30%.  Echo (3/15) with EF 25%, mild to moderately dilated LV, normal RV size and systolic function.     Osteomyelitis (HCC)    left fifth ray   Pneumonia    Urethral stricture    Wears glasses     Medications:  Medications Prior to Admission  Medication Sig Dispense Refill Last Dose   acetaminophen (TYLENOL) 325 MG tablet Take 1-2 tablets (325-650 mg total) by mouth every 4 (four) hours as needed for mild pain.   Unknown   aspirin EC 81 MG tablet Take 81 mg by mouth daily.    08/17/2022   atorvastatin (LIPITOR) 80 MG tablet TAKE 1 TABLET BY MOUTH AT BEDTIME 90 tablet 3 08/16/2022   azelastine (ASTELIN) 0.1 % nasal spray Place 2 sprays into both nostrils 3 (three) times daily as needed for rhinitis. Use in each nostril as directed 30 mL 12 Unknown   CALCITRIOL PO Take by mouth 3 (three) times a week. ONLY PROVIDED AT DIALYSIS   08/16/2022   carvedilol (COREG) 12.5 MG tablet Take 1 tablet by mouth  twice daily 180 tablet 0 08/17/2022   cinacalcet (SENSIPAR) 30 MG tablet Take 1 tablet (30 mg total) by mouth Every Tuesday,Thursday,and Saturday with dialysis. 60 tablet  08/16/2022   erythromycin ophthalmic ointment Place 1 Application into the right eye at bedtime.   08/16/2022   fenofibrate 160 MG tablet TAKE 1 TABLET BY MOUTH ONCE DAILY 90 tablet 3 08/17/2022   fexofenadine (ALLEGRA) 180 MG tablet Take 180 mg by mouth daily as needed for allergies.   Unknown   furosemide (LASIX) 40 MG tablet Take 3 tablets by mouth once daily 270 tablet 0 08/17/2022   gabapentin (NEURONTIN) 300 MG capsule TAKE 2 CAPSULES BY MOUTH TWICE DAILY . 360 capsule 3 08/17/2022   HUMALOG KWIKPEN 200 UNIT/ML KwikPen INJECT A MAXIMUM OF 28 UNITS SUBCUTANEOUSLY TWICE DAILY WITH LUNCH AND SUPPER PER SLIDING SCALE. APPOINTMENT REQUIRED FOR FUTURE REFILLS 6 mL 2 08/17/2022    lanthanum (FOSRENOL) 500 MG chewable tablet Chew 500-1,000 mg by mouth See admin instructions. Take 1000 mg with meals three time a day and 500 mg with snacks   08/17/2022   LANTUS SOLOSTAR 100 UNIT/ML Solostar Pen INJECT 12 UNITS SUBCUTANEOUSLY AT BEDTIME 15 mL 0 08/16/2022   midodrine (PROAMATINE) 10 MG tablet Take 10 mg by mouth 3 (three) times a week.   Past Week   multivitamin (RENA-VIT) TABS tablet Take 1 tablet by mouth once daily 10 tablet 3 08/17/2022   nortriptyline (PAMELOR) 25 MG capsule Take 1 capsule (25 mg total) by mouth at bedtime. 30 capsule 3 08/16/2022   Olopatadine HCl 0.2 % SOLN Place 1 drop into both eyes daily as needed (for allergies).    Past Week   tobramycin (TOBREX) 0.3 % ophthalmic solution Place 1 drop into the right eye every 6 (six) hours.   08/17/2022   VENTOLIN HFA 108 (90 Base) MCG/ACT inhaler Inhale 1-2 puffs into the lungs every 6 (six) hours as needed for wheezing or shortness of breath. 18 g 1 08/17/2022   Continuous Glucose Receiver (FREESTYLE LIBRE 3 READER) DEVI Use to check blood glucose TID 1 each 0    Continuous Glucose Sensor (FREESTYLE LIBRE 14 DAY SENSOR) MISC USE AS DIRECTED EVERY 14 DAYS 6 each 3    Continuous Glucose Sensor (FREESTYLE LIBRE 3 SENSOR) MISC Place 1 sensor on the skin every 14 days. Use to check glucose continuously 2 each 11    glucose blood test strip Check 1 time daily. E11.9 One Touch Ultra Blue Test Strips 100 each 3    Insulin Pen Needle (BD PEN NEEDLE NANO U/F) 32G X 4 MM MISC USE 1 PEN NEEDLE SUBCUTANEOUSLY WITH INSULIN 4 TIMES DAILY 400 each 0    Scheduled:   aspirin EC  81 mg Oral Daily   atorvastatin  80 mg Oral QHS   carvedilol  25 mg Oral BID WC   Chlorhexidine Gluconate Cloth  6 each Topical Q0600   cinacalcet  30 mg Oral Q T,Th,Sa-HD   fenofibrate  160 mg Oral Daily   gabapentin  300 mg Oral BID   heparin  5,000 Units Subcutaneous Q8H   insulin aspart  0-9 Units Subcutaneous TID WC   insulin glargine-yfgn  12 Units  Subcutaneous QHS   lanthanum  500 mg Oral TID WC   midodrine  10 mg Oral Q T,Th,Sa-HD   mometasone-formoterol  2 puff Inhalation BID   multivitamin  1 tablet Oral Daily   nortriptyline  25 mg Oral QHS    Assessment: 61 YOM with  new onset AF. He has no prior history of DOAC or coumadin usage noted. 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 CT head was unremarkable for CVA. CTA head / neck w/ contrast was also unremarkable for brain mass, hemorrhage, acute / chronic infarct.   Cardiology placed a consult for Pharmacy to manage heparin infusion therapy.    Goal of Therapy:  Heparin level 0.3-0.7 units/ml Monitor platelets by anticoagulation protocol: Yes   Plan:  Patient received 5000 units of heparin sq at 13:38. Will omit the bolus dose at this time Start heparin infusion at 1200 units/hr Check heparin level in 8 hours and daily while on heparin Continue to monitor H&H and platelets Follow- up on converting to a DOAC at a later time. Copay check for apixaban initiated  Greta Doom BS, PharmD, BCPS Clinical Pharmacist 08/24/2022 5:04 PM  Contact: [phone] after 3 PM  "Be curious, not judgmental..." -Debbora Dus

## 2022-08-24 NOTE — Progress Notes (Signed)
PROGRESS NOTE    Anthony Bullock  PPI:[id number] DOB: [birth year] DOA: 08/18/2022 PCP: Kristian Covey, MD    No chief complaint on file.   Brief Narrative: Mr. Milonas was admitted to the hospital with the working diagnosis of volume overload.    62 yo male with the past medical history of hypertension, hyperlipidemia, T2DM, bilateral BKA, and ESRD on HD who presented with dyspnea and cough. Patient developed acute dyspnea and cough. He went to [facility] outpatient [facility], [facility] he was found hypoxic, 02 saturation 70% on room air. He was placed on supplemental 02 per non re-brether 15L and was transferred to the ED. He reports not being compliant with salt or fluid restrictions at home.  In the ED his blood pressure was 139/71, HR 101, RR 20 and 02 saturation 97%, lungs with bilateral rhonchi, with no wheezing, heart with S1 and S2 present and regular with no gallops, abdomen with no distention, bilateral BKA.    Na 134, K 5,0 Cl 97, bicarbonate 20, glucose 226 bun 66 cr 11,7 Anion gap 17  BNP 1,158  High sensitive troponin 674 and 702  Lactic acid 1,2 and 2,9  Wbc 14,5 hgb 14.2    Chest radiograph from [facility] with reported pulmonary edema and small pleural effusions.    EKG 104 bpm, left axis deviation, left bundle branch block, sinus rhythm with left atrial enlargement, no significant ST segment or T wave changes.    08/08 Nephrology was consulted and recommendations for emergent hemodialysis.  08/12 had hemodialysis with ultrafiltration on consecutive days. 08/13 patient developed atrial fibrillation during HD. Treatment has held and patient had IV fluid bolus.  Patient subsequently placed on a Cardizem drip which was discontinued early hours of 08/24/2022 as patient with low blood pressure. Cardiology consulted.   Assessment & Plan:   Principal Problem:   ESRD on dialysis [facility]) Active Problems:   Chronic systolic congestive heart failure (HCC)   Pneumonia due to infectious  organism   Atrial fibrillation (HCC)   Essential hypertension   Type 2 diabetes mellitus with hyperlipidemia (HCC)   COPD (chronic obstructive pulmonary disease) (HCC)   Adjustment disorder with depressed mood   CAD (coronary artery disease)   S/P bilateral below knee amputation (HCC)  #1 acute hypoxemic respiratory failure secondary to volume overload -Patient had presented with volume overload with acute hypoxic respiratory failure. -Patient seen in consultation by nephrology and patient underwent hemodialysis with 1.1 L ultrafiltration removed. -Dyspnea/shortness of breath improved significantly post hemodialysis per patient. -Repeat chest x-ray done with improvement with infiltrates but continued to have features of pulmonary edema. -2D echo done with EF of 30 to 35%, left ventricular global hypokinesis, moderate concentric LVH, grade 2 diastolic dysfunction, mildly dilated left atrial size, moderate MVR. -Due to abnormal 2D echo, new onset A-fib cardiology consulted for further evaluation and management. -Patient currently with sats of 96% on room air. -Patient on HD, and patient present for hemodialysis today per his usual schedule. -Appreciate nephrology input and recommendations.  2.  End-stage renal disease on HD -Patient noted to have gone into A-fib with RVR during routine hemodialysis on 08/23/2022. -Patient noted to have received IV metoprolol 500 cc normal saline bolus. -Patient currently noted to have received an extra HD due to volume overload on 08/22/2022. -Per nephrology.  3.  New onset A-fib CHA2DS2VASC =4 -Patient noted to monitor A-fib while on hemodialysis yesterday 08/23/2022. -Patient noted to have received a 500 cc bolus, and received IV  metoprolol 5 mg x 1 for rate control. -Patient subsequently had to be placed on a Cardizem drip yesterday for better rate control, however overnight patient noted to have soft/low blood pressure and Cardizem drip discontinued and  patient received a bolus of IV fluids. -Patient currently normal sinus rhythm rate currently controlled and as such we will discontinue Cardizem drip. -Patient with 2D echo done with EF of 30 to 35%, left ventricular global hypokinesis, moderate concentric LVH, grade 2 diastolic dysfunction, mildly dilated left atrial size, moderate MVR. -Due to abnormal 2D echo, new onset A-fib cardiology consulted for further evaluation and management. -Patient with no further hemoptysis and has been started on a heparin drip per cardiology. -Due to abnormal 2D echo patient for Lexiscan Myoview stress test tomorrow per cardiology..  4.  CAD/elevated troponin/abnormal 2D echo -Patient denies any ongoing chest pain however presented with shortness of breath, noted to be volume overloaded on exam and received hemodialysis with some improvement with pulmonary edema. -2D echo with a EF of 30 to 35%, previously was 50% in 2021, left ventricular global hypokinesis. -Cardiology consulted for further evaluation and management and patient for probable Myoview stress test tomorrow and further recommendations per cardiology.   5.  Acute on chronic systolic heart failure -See problem #1. -2D echo done with EF of 30 to 35%, left ventricular global hypokinesis, moderate concentric LVH, grade 2 diastolic dysfunction, mildly dilated left atrial size, moderate MVR. -Due to abnormal 2D echo, new onset A-fib cardiology consulted for further evaluation and management. -Patient for Lexiscan Myoview stress test tomorrow per cardiology. -On HD for volume management.  6.  Pneumonia ruled out  7.  Hypertension -Currently on carvedilol. -Due to soft low blood pressure issues some of patient's cardiac medications have been discontinued in the outpatient setting. -Patient receiving midodrine on HD days.  8.  Type 2 diabetes mellitus -Hemoglobin A1c 8.7 (06/24/2022) -CBG noted at 161 this morning. -Continue Semglee 12 units daily,  SSI.  9.  COPD -Stable. -Continue bronchodilator therapy and inhaled corticosteroids. -Pulmonary toileting with flutter valve and incentive spirometry.  10.  Adjustment disorder with depressed mood -Nortriptyline.  11.  Status post bilateral BKA -Patient with prosthesis for ambulation.    DVT prophylaxis: Heparin Code Status: Full Family Communication: Updated patient.  No family at bedside. Disposition: TBD  Status is: Inpatient Remains inpatient appropriate because: Severity of illness   Consultants:  Nephrology: Dr. Allena Katz 08/18/2022 Cardiology pending  Procedures:  Chest x-ray 08/19/2022, 08/20/2022 2D echo 08/22/2022   Antimicrobials:  Anti-infectives (From admission, onward)    Start     Dose/Rate Route Frequency Ordered Stop   08/20/22 1000  azithromycin (ZITHROMAX) tablet 500 mg  Status:  Discontinued        500 mg Oral Daily 08/19/22 1520 08/19/22 1548   08/19/22 1000  azithromycin (ZITHROMAX) 500 mg in sodium chloride 0.9 % 250 mL IVPB  Status:  Discontinued        500 mg 250 mL/hr over 60 Minutes Intravenous Every 24 hours 08/18/22 2147 08/19/22 1520   08/19/22 0900  cefTRIAXone (ROCEPHIN) 2 g in sodium chloride 0.9 % 100 mL IVPB  Status:  Discontinued        2 g 200 mL/hr over 30 Minutes Intravenous Every 24 hours 08/18/22 2147 08/19/22 1548         Subjective: Patient standing up at sink brushing his teeth.  OT had a side.  States shortness of breath improved significantly since admission.  Denies any chest  pain.  No dizziness. Currently off Cardizem drip, back in normal sinus rhythm on telemetry.  Overall feels well.  Denies any further hemoptysis. Events overnight noted with low blood pressure and discontinuation of diltiazem drip.  Objective: Vitals:   08/24/22 1206 08/24/22 1207 08/24/22 1543 08/24/22 1654  BP: (!) 99/52 (!) 99/52 91/64 (!) 122/42  Pulse: 74 73 75 74  Resp: 18 19 20    Temp: (!) 96 F (35.6 C) (!) 97.5 F (36.4 C) 97.7 F (36.5  C)   TempSrc: Axillary Oral Oral   SpO2: 99% 98% 96% 97%  Weight:      Height:        Intake/Output Summary (Last 24 hours) at 08/24/2022 1752 Last data filed at 08/24/2022 1722 Gross per 24 hour  Intake 768.14 ml  Output 0 ml  Net 768.14 ml   Filed Weights   08/22/22 1800 08/23/22 1317 08/23/22 1715  Weight: (S) 88.3 kg (S) 88.9 kg (S) 89.2 kg    Examination:  General exam: Appears calm and comfortable  Respiratory system: Clear to auscultation. Respiratory effort normal. Cardiovascular system: S1 & S2 heard, RRR. No JVD, murmurs, rubs, gallops or clicks. No pedal edema. Gastrointestinal system: Abdomen is nondistended, soft and nontender. No organomegaly or masses felt. Normal bowel sounds heard. Central nervous system: Alert and oriented. No focal neurological deficits. Extremities: Bilateral BKA.  Prosthesis on.  Skin: No rashes, lesions or ulcers Psychiatry: Judgement and insight appear normal. Mood & affect appropriate.     Data Reviewed: I have personally reviewed following labs and imaging studies  CBC: Recent Labs  Lab 08/18/22 2125 08/20/22 0436 08/21/22 0409 08/22/22 0230  WBC 14.5* 12.7* 11.5* 11.0*  NEUTROABS 12.7* 9.8*  --  8.6*  HGB 14.2 11.3* 11.0* 10.6*  HCT 43.1 34.5* 34.1* 32.9*  MCV 92.1 92.5 93.2 90.6  PLT PLATELET CLUMPS NOTED ON SMEAR, UNABLE TO ESTIMATE 114* 119* 141*    Basic Metabolic Panel: Recent Labs  Lab 08/18/22 2125 08/20/22 0436 08/21/22 0409 08/22/22 0209 08/23/22 0130 08/24/22 0115  NA 134* 129* 130* 130* 132* 132*  K 5.0 3.8 3.7 4.0 3.6 3.9  CL 97* 90* 89* 91* 93* 94*  CO2 20* 23 25 23 25 23   GLUCOSE 226* 200* 251* 226* 180* 218*  BUN 66* 40* 32* 54* 34* 28*  CREATININE 11.76* 9.09* 6.58* 8.79* 6.63* 5.02*  CALCIUM 7.7* 8.0* 8.2* 8.2* 8.4* 8.5*  MG 1.8  --   --   --   --   --   PHOS  --  3.8  --  5.6* 3.6 4.1    GFR: Estimated Creatinine Clearance: 16.7 mL/min (A) (by C-G formula based on SCr of 5.02 mg/dL  (H)).  Liver Function Tests: Recent Labs  Lab 08/18/22 2125 08/20/22 0436 08/22/22 0209 08/23/22 0130 08/24/22 0115  AST 25  --   --   --   --   ALT 21  --   --   --   --   ALKPHOS 51  --   --   --   --   BILITOT 0.8  --   --   --   --   PROT 6.6  --   --   --   --   ALBUMIN 2.9* 2.4* 2.3* 2.3* 2.3*    CBG: Recent Labs  Lab 08/23/22 1719 08/23/22 2158 08/24/22 0622 08/24/22 1205 08/24/22 1542  GLUCAP 143* 189* 161* 278* 217*     Recent Results (from the past  240 hour(s))  Culture, blood (Routine X 2) w Reflex to ID Panel     Status: None   Collection Time: 08/18/22  9:25 PM   Specimen: BLOOD  Result Value Ref Range Status   Specimen Description BLOOD SITE NOT SPECIFIED  Final   Special Requests   Final    BOTTLES DRAWN AEROBIC AND ANAEROBIC Blood Culture adequate volume   Culture   Final    NO GROWTH 5 DAYS Performed at [facility], [address] [place], [place] [address]    Report Status 08/23/2022 FINAL  Final  Culture, blood (Routine X 2) w Reflex to ID Panel     Status: None   Collection Time: 08/18/22  9:30 PM   Specimen: BLOOD  Result Value Ref Range Status   Specimen Description BLOOD SITE NOT SPECIFIED  Final   Special Requests   Final    BOTTLES DRAWN AEROBIC AND ANAEROBIC Blood Culture adequate volume   Culture   Final    NO GROWTH 5 DAYS Performed at [facility], [address], [place], [place] [address]    Report Status 08/23/2022 FINAL  Final  Respiratory (~20 pathogens) panel by PCR     Status: None   Collection Time: 08/19/22 11:52 AM   Specimen: Nasopharyngeal Swab; Respiratory  Result Value Ref Range Status   Adenovirus NOT DETECTED NOT DETECTED Final   Coronavirus 229E NOT DETECTED NOT DETECTED Final    Comment: (NOTE) The Coronavirus on the Respiratory Panel, DOES NOT test for the novel  Coronavirus (2019 nCoV)    Coronavirus HKU1 NOT DETECTED NOT DETECTED Final   Coronavirus NL63 NOT DETECTED NOT DETECTED Final    Coronavirus OC43 NOT DETECTED NOT DETECTED Final   Metapneumovirus NOT DETECTED NOT DETECTED Final   Rhinovirus / Enterovirus NOT DETECTED NOT DETECTED Final   Influenza A NOT DETECTED NOT DETECTED Final   Influenza B NOT DETECTED NOT DETECTED Final   Parainfluenza Virus 1 NOT DETECTED NOT DETECTED Final   Parainfluenza Virus 2 NOT DETECTED NOT DETECTED Final   Parainfluenza Virus 3 NOT DETECTED NOT DETECTED Final   Parainfluenza Virus 4 NOT DETECTED NOT DETECTED Final   Respiratory Syncytial Virus NOT DETECTED NOT DETECTED Final   Bordetella pertussis NOT DETECTED NOT DETECTED Final   Bordetella Parapertussis NOT DETECTED NOT DETECTED Final   Chlamydophila pneumoniae NOT DETECTED NOT DETECTED Final   Mycoplasma pneumoniae NOT DETECTED NOT DETECTED Final    Comment: Performed at [facility], [address] [place], [place] [address]  SARS Coronavirus 2 by RT PCR (hospital order, performed in [facility] hospital lab) *cepheid single result test* Anterior Nasal Swab     Status: None   Collection Time: 08/19/22  1:50 PM   Specimen: Anterior Nasal Swab  Result Value Ref Range Status   SARS Coronavirus 2 by RT PCR NEGATIVE NEGATIVE Final    Comment: Performed at [facility], [address], [place], [place] [address]         Radiology Studies: No results found.      Scheduled Meds:  aspirin EC  81 mg Oral Daily   atorvastatin  80 mg Oral QHS   carvedilol  25 mg Oral BID WC   Chlorhexidine Gluconate Cloth  6 each Topical Q0600   cinacalcet  30 mg Oral Q T,Th,Sa-HD   fenofibrate  160 mg Oral Daily   gabapentin  300 mg Oral BID   insulin aspart  0-9  Units Subcutaneous TID WC   insulin glargine-yfgn  12 Units Subcutaneous QHS   lanthanum  500 mg Oral TID WC   midodrine  10 mg Oral Q T,Th,Sa-HD   mometasone-formoterol  2 puff Inhalation BID   multivitamin  1 tablet Oral Daily   nortriptyline  25 mg Oral QHS   Continuous Infusions:  heparin 1,200 Units/hr  (08/24/22 1722)     LOS: 6 days    Time spent: 40 minutes    Ramiro Harvest, MD [facility]   To contact the attending provider between 7A-7P or the covering provider during after hours 7P-7A, please log into the web site [email or web address] and access using universal [place] password for that web site. If you do not have the password, please call the hospital operator.  08/24/2022, 5:52 PM

## 2022-08-24 NOTE — Progress Notes (Signed)
Occupational Therapy Treatment Patient Details Name: Anthony Bullock MRN: [id number] DOB: 1960-12-06 Today's Date: 08/24/2022   History of present illness Pt is a 62 y/o M presenting to ED on 8/8 with SOB, volume overload. On 8/12, pt went into a fib with RVR after HD. PMH includes ESRD on HD TTS, HTN, HLD, DM2, insulin dependent with peripheral diabetic neuropahty, s/p bilateral BKA, anxiety, depression   OT comments  Pt progressing towards goals this session, needing set up - CGA for ADLs, supervision for bed mobility, and CGA for transfers with RW. Pt with O2 desat on 1L O2 , low 80's but poor wave pleth, increased to mid 90s with good wave pleth on 1L O2 once pt returned to supine in bed. Pt asymptomatic. Pt presenting with impairments listed below, will follow acutely. Continue to recommend HHOT at d/c.      If plan is discharge home, recommend the following:  A little help with walking and/or transfers;A lot of help with bathing/dressing/bathroom;Help with stairs or ramp for entrance;Assist for transportation   Equipment Recommendations  None recommended by OT    Recommendations for Other Services PT consult    Precautions / Restrictions Precautions Precautions: Fall Precaution Comments: bil BKA with prostheses in room, watch O2 Restrictions Weight Bearing Restrictions: No       Mobility Bed Mobility Overal bed mobility: Needs Assistance Bed Mobility: Sit to Supine       Sit to supine: Supervision        Transfers Overall transfer level: Needs assistance Equipment used: Rolling walker (2 wheels) Transfers: Sit to/from Stand Sit to Stand: Contact guard assist           General transfer comment: increased time     Balance Overall balance assessment: Needs assistance Sitting-balance support: Feet supported, No upper extremity supported Sitting balance-Leahy Scale: Good Sitting balance - Comments: Pt able to don BLE prosthesis sitting EOB supervision.    Standing balance support: During functional activity, Reliant on assistive device for balance, Bilateral upper extremity supported Standing balance-Leahy Scale: Poor                             ADL either performed or assessed with clinical judgement   ADL Overall ADL's : Needs assistance/impaired     Grooming: Set up;Sitting;Oral care Grooming Details (indicate cue type and reason): brushes teeth standing at sink             Lower Body Dressing: Supervision/safety;Sitting/lateral leans Lower Body Dressing Details (indicate cue type and reason): doffing prosthetics Toilet Transfer: Contact guard assist;Ambulation;Regular Toilet;Rolling walker (2 wheels)           Functional mobility during ADLs: Contact guard assist;Rolling walker (2 wheels)      Extremity/Trunk Assessment Upper Extremity Assessment Upper Extremity Assessment: Generalized weakness   Lower Extremity Assessment Lower Extremity Assessment: Defer to PT evaluation        Vision   Additional Comments: pt with eye patch/ lengs of R eye covered   Perception Perception Perception: Not tested   Praxis Praxis Praxis: Not tested    Cognition Arousal: Alert Behavior During Therapy: [facility] for tasks assessed/performed Overall Cognitive Status: Within Functional Limits for tasks assessed                                          Exercises  Shoulder Instructions       General Comments SpO2 down to low 80's on RA-1L O1 during session with poor wave pleth, in 90's with good wave pleth on 1L O2 once supine in bed    Pertinent Vitals/ Pain       Pain Assessment Pain Assessment: No/denies pain  Home Living                                          Prior Functioning/Environment              Frequency  Min 1X/week        Progress Toward Goals  OT Goals(current goals can now be found in the care plan section)  Progress towards OT goals:  Progressing toward goals  Acute Rehab OT Goals Patient Stated Goal: none stated OT Goal Formulation: With patient Time For Goal Achievement: 09/03/22 Potential to Achieve Goals: Good ADL Goals Pt Will Perform Upper Body Dressing: with contact guard assist;sitting Pt Will Perform Lower Body Dressing: with contact guard assist;sitting/lateral leans;sit to/from stand Pt Will Transfer to Toilet: with contact guard assist;ambulating;regular height toilet Additional ADL Goal #1: pt will verablize x3 energy conservation strategies in prep for ADLs  Plan      Co-evaluation                 AM-PAC OT "6 Clicks" Daily Activity     Outcome Measure   Help from another person eating meals?: None Help from another person taking care of personal grooming?: None Help from another person toileting, which includes using toliet, bedpan, or urinal?: A Lot Help from another person bathing (including washing, rinsing, drying)?: A Lot Help from another person to put on and taking off regular upper body clothing?: A Little Help from another person to put on and taking off regular lower body clothing?: A Little 6 Click Score: 18    End of Session Equipment Utilized During Treatment: Gait belt  OT Visit Diagnosis: Unsteadiness on feet (R26.81);Other abnormalities of gait and mobility (R26.89);Muscle weakness (generalized) (M62.81)   Activity Tolerance Patient tolerated treatment well   Patient Left in chair;with call bell/phone within reach;with chair alarm set;with nursing/sitter in room;with family/visitor present   Nurse Communication Mobility status        Time: 3244-0102 OT Time Calculation (min): 14 min  Charges: OT General Charges $OT Visit: 1 Visit OT Treatments $Self Care/Home Management : 8-22 mins  Carver Fila, OTD, OTR/L SecureChat Preferred Acute Rehab [phone]   Carver Fila Koonce 08/24/2022, 12:21 PM

## 2022-08-24 NOTE — Progress Notes (Signed)
[facility] Progress Note   Subjective:    Seen and examined patient at bedside. Patient now back in SR with HR ranging 80-90s. BP at bedside is 119/37. Patient denies CP, palpitations, or dizziness overnight. D/w bedside RN: Cardizem drip was stopped around 1am 2nd hypotension. He continues to feel well currently. No UF during yesterday's HD. Hoping patient can go home soon.  Objective Vitals:   08/24/22 0531 08/24/22 0744 08/24/22 0818 08/24/22 0859  BP: 94/68 (!) 92/33  (!) 119/37  Pulse:      Resp: 16 14  16   Temp:  97.8 F (36.6 C)    TempSrc:  Oral    SpO2:  100% 100%   Weight:      Height:       Physical Exam General: Alert male in NAD Heart: RRR, no murmurs, rubs or gallops Lungs: CTA bilaterally but diminished in bases. On 2L O2 via [place], respirations unlabored Abdomen: Soft, non-distended, +BS Extremities: B/l BKA, no LE edema appreciated Dialysis Access:  [facility]  Filed Weights   08/22/22 1800 08/23/22 1317 08/23/22 1715  Weight: (S) 88.3 kg (S) 88.9 kg (S) 89.2 kg    Intake/Output Summary (Last 24 hours) at 08/24/2022 1007 Last data filed at 08/24/2022 0900 Gross per 24 hour  Intake 769.76 ml  Output -200 ml  Net 969.76 ml    Additional Objective Labs: Basic Metabolic Panel: Recent Labs  Lab 08/22/22 0209 08/23/22 0130 08/24/22 0115  NA 130* 132* 132*  K 4.0 3.6 3.9  CL 91* 93* 94*  CO2 23 25 23   GLUCOSE 226* 180* 218*  BUN 54* 34* 28*  CREATININE 8.79* 6.63* 5.02*  CALCIUM 8.2* 8.4* 8.5*  PHOS 5.6* 3.6 4.1   Liver Function Tests: Recent Labs  Lab 08/18/22 2125 08/20/22 0436 08/22/22 0209 08/23/22 0130 08/24/22 0115  AST 25  --   --   --   --   ALT 21  --   --   --   --   ALKPHOS 51  --   --   --   --   BILITOT 0.8  --   --   --   --   PROT 6.6  --   --   --   --   ALBUMIN 2.9*   < > 2.3* 2.3* 2.3*   < > = values in this interval not displayed.   No results for input(s): "LIPASE", "AMYLASE" in the last 168  hours. CBC: Recent Labs  Lab 08/18/22 2125 08/20/22 0436 08/21/22 0409 08/22/22 0230  WBC 14.5* 12.7* 11.5* 11.0*  NEUTROABS 12.7* 9.8*  --  8.6*  HGB 14.2 11.3* 11.0* 10.6*  HCT 43.1 34.5* 34.1* 32.9*  MCV 92.1 92.5 93.2 90.6  PLT PLATELET CLUMPS NOTED ON SMEAR, UNABLE TO ESTIMATE 114* 119* 141*   Blood Culture    Component Value Date/Time   SDES BLOOD SITE NOT SPECIFIED 08/18/2022 2130   SPECREQUEST  08/18/2022 2130    BOTTLES DRAWN AEROBIC AND ANAEROBIC Blood Culture adequate volume   CULT  08/18/2022 2130    NO GROWTH 5 DAYS Performed at [facility], [address], [place], [place] [address]    REPTSTATUS 08/23/2022 FINAL 08/18/2022 2130    Cardiac Enzymes: No results for input(s): "CKTOTAL", "CKMB", "CKMBINDEX", "TROPONINI" in the last 168 hours. CBG: Recent Labs  Lab 08/23/22 0629 08/23/22 1215 08/23/22 1719 08/23/22 2158 08/24/22 0622  GLUCAP 160* 293* 143* 189* 161*   Iron Studies: No results  for input(s): "IRON", "TIBC", "TRANSFERRIN", "FERRITIN" in the last 72 hours. Lab Results  Component Value Date   INR 1.2 08/05/2022   INR 1.1 08/17/2019   INR 1.0 11/06/2017   Studies/Results: No results found.  Medications:  diltiazem (CARDIZEM) infusion Stopped (08/24/22 0100)    aspirin EC  81 mg Oral Daily   atorvastatin  80 mg Oral QHS   carvedilol  25 mg Oral BID WC   Chlorhexidine Gluconate Cloth  6 each Topical Q0600   cinacalcet  30 mg Oral Q T,Th,Sa-HD   fenofibrate  160 mg Oral Daily   gabapentin  300 mg Oral BID   heparin  5,000 Units Subcutaneous Q8H   insulin aspart  0-9 Units Subcutaneous TID WC   insulin glargine-yfgn  12 Units Subcutaneous QHS   lanthanum  500 mg Oral TID WC   midodrine  10 mg Oral Q T,Th,Sa-HD   mometasone-formoterol  2 puff Inhalation BID   multivitamin  1 tablet Oral Daily   nortriptyline  25 mg Oral QHS    Dialysis Orders: [facility] TTS, EDW 91.5 kg   Assessment/Plan: 1.  Acute hypoxic  respiratory failure: Possibly multifactorial but likely from volume overload in this patient with COPD, pneumonia less likely based on clinical picture. Serial HD, improving and O2 requirement is going down. Na 132 after extra HD yesterday. Next HD today per his usual schedule. 2.  End-stage renal disease: Patient went into Afib with RVR during his routine HD yesterday. 500cc NS bolus and Metoprolol 5mg  IV given. Appreciate primary and Cardiology's assistance. Unclear what precipitated the Afib: volume status vs something else. K+ remains stable. He was volume overloaded on exam requiring an extra HD 8/12. Cardizem drip now off and currently in SR. 3. New onset Afib - See above. Cardizem drip now off (2nd hypotension) and in SR. Continue Coreg, I was informed yesterday Cardiology to be consulted-appreciate their assistance. 4.  Hypotension: Blood pressure improved with HD, continue carvedilol and midodrine pre-HD 5.  Anemia of chronic disease: Hgb at goal, no Fe or ESA is indicated at this time 6.  Secondary hyperparathyroidism: Calcium and phos controlled. Continue binders and Sensipar  7. Dispo - Remains inpatient (see above). Continue to feel well this morning. BP is stable and back in SR. Cardizem now off. Will await primary and cardiology's approval for discharge.  [name], NP [facility] 08/24/2022,10:07 AM  LOS: 6 days

## 2022-08-24 NOTE — Consult Note (Signed)
Cardiology Consultation   Patient ID: [facility] MRN: [id number]; DOB: 06-25-1960  Admit date: 08/18/2022 Date of Consult: 08/24/2022  PCP:  Anthony Covey, MD   [facility] Providers Cardiologist:  Anthony Rich, MD  Electrophysiologist:  Anthony Manges, MD  {  Patient Profile:   [facility] is a 62 y.o. male with a history of diffuse nonobstructive CAD on cardiac catheterization in 2015, chronic at HFrEF with EF a low a 25-30% in 2014 with subsequent normalization, s/p ICD in 06/2013 for primary prevention of sudden cardiac death but did no have generator change given EF recovered and now ICD is inactive, PAD s/p bilateral below knee amputations (right in 2017 and left in 2021), mild bilateral carotid stenosis, COPD, hypertension, hyperlipidemia, type 2 diabetes mellitus with diabetic neuropathy and nephropathy, ESRD on hemodialysis T/Th/ Sat, and remote tobacco use (quit around 13 years ago) who is being seen 08/24/2022 for the evaluation of atrial fibrillation at the request of Dr. Janee [facility].  History of Present Illness:   [facility] is a 62 year old male with the above history who is followed by Dr. Dina [facility] and [facility].  He has a long history of chronic HFrEF with EF as low as 25-30% in 12/2012.  Catheterization in 01/2013 showed CAD with diffuse diabetic vasculopathy but only high-grade lesion was in a small distal LAD.  Continued medical therapy was recommended.  Cardiomyopathy was felt to be nonischemic in nature.  He started on GDMT and ultimately had an ICD placed in 06/2013 for prevention of sudden cardiac death. EF later normalized to 50-55%. He never had ICD generator change given EF recovered and ICD is now inactive. Echo in 08/2019 showed LVEF of 50% with grade 1 diastolic dysfunction.  He was last seen by [facility] in 05/2022 at which time he reported some low BPs during dialysis requiring Midodrine but was otherwise stable from a cardiac standpoint.    Patient known to [facility] on 08/18/2022 for with reports of cough and shortness of breath.  He had presented to dialysis earlier in the day but was not dialyzed and sent to the ED due to respiratory distress O2 sats in the 70s.  Initial ABG showed pH of 7.23, PaO2 of 49, and pCO2 of 49.1 and he was placed on BiPAP with improvement.  Labs remarkable for WBC of 21.9, BNP of 29,111, high-sensitivity troponin of 81, lactic acid of 2.9, creatinine 10.56.  Chest x-ray showed evidence of progress pulmonary edema since last month and small pleural effusions.  He was empirically started on antibiotics and transferred to [facility] where he was admitted for suspected pneumonia.  However, pneumonia was subsequently ruled out and distress felt to be due to edema and volume overload has been managed via dialysis. Echo showed drop in EF to 30-35% with global hypokinesis and grade 2 diastolic dysfunction as well as moderate MR. During dialysis session on 08/23/2022, he went into atrial fibrillation with RVR and was started on Diltiazem but this had to be discontinued due to hypotension. He was given a small fluid bolus. Cardiology consulted for further evaluation.  At the time of this evaluation, patient is resting comfortably no acute distress.  He is currently back in normal sinus rhythm.  Per review of telemetry, it looks like patient was in atrial fibrillation for about 12 hours from around 2:30 PM on 8/13 to around 2am this morning.  He states he felt weak while in a atrial fibrillation but  was otherwise asymptomatic with no palpitations.  It is not unusual for him to feel weak during dialysis due to his BP drop.  He states he presented with sudden onset of shortness of breath at rest on the morning of 08/18/2022 had been doing well since then.  He does have stable orthopnea and sleeps at about a 30 degree angle but no PND or edema.  No chest pain.  He reports some lightheadedness/dizziness when his BP drops but no  syncope.  He has had a cough since being here in [facility] but no other no other recent fevers or illnesses.  He does not make any urine at this point with his ESRD but denies any abnormal bleeding in his stools.  Of note, patient states he had a stroke about a month ago. He states this was diagnosed as an outpatient by Neurology. 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 Head CT showed no acute intracranial findings and head/ neck CTA showed severe stenosis of bilateral cavernous internal carotid arteries and moderate mixed density atherosclerosis within the distal left common carotid artery and at the carotid bifurcation as well as moderate narrowing of the left vertebral artery origin but no emergent large vessel occlusion. Etiology felt to be more due to his diabetes and hypertension. DAPT was considered but given higher bleeding risk with DAPT while on dialysis, he was continued on Aspirin monotherapy. Plan is to repeat CT angiogram in 6-9 months.   Past Medical History:  Diagnosis Date   AICD (automatic cardioverter/defibrillator) present    [employer]   Allergic rhinitis    Anemia    Arthritis    Chronic systolic heart failure (HCC)    a. ECHO (12/2012) EF 25-30%, HK entireanteroseptal myocardium //  b.  EF 25%, diffuse HK, grade 1 diastolic dysfunction, MAC, mild LAE, normal RVSF, trivial pericardial effusion   COPD (chronic obstructive pulmonary disease) (HCC)    Diabetes mellitus type II    Diabetic nephropathy (HCC)    Diabetic neuropathy (HCC)    ESRD on hemodialysis (HCC)    started HD June 2017, [facility], Dr Fausto Skillern   History of cardiac catheterization    a.Myoview 1/15:  There is significant left ventricular dysfunction. There may be slight scar at the apex. There is no significant ischemia. LV Ejection Fraction: 27%  //  b. RHC/LHC (1/15) with mean RA 6, PA 47/22 mean 33,  mean PCWP 20, PVR 2.5 WU, CI 2.5; 80% dLAD stenosis, 70% diffuse large D.     History of kidney stones    Hyperlipidemia    Hypertension    Kidney stones    NICM (nonischemic cardiomyopathy) (HCC)    Primarily nonischemic.  Echo (12/14) with EF 25-30%.  Echo (3/15) with EF 25%, mild to moderately dilated LV, normal RV size and systolic function.     Osteomyelitis (HCC)    left fifth ray   Pneumonia    Urethral stricture    Wears glasses     Past Surgical History:  Procedure Laterality Date   ABDOMINAL AORTOGRAM W/LOWER EXTREMITY N/A 03/30/2016   Procedure: Abdominal Aortogram w/Lower Extremity;  Surgeon: Chuck Hint, MD;  Location: [facility] INVASIVE CV LAB;  Service: Cardiovascular;  Laterality: N/A;   AMPUTATION Right 04/26/2016   Procedure: Right Below Knee Amputation;  Surgeon: Nadara Mustard, MD;  Location: [facility] OR;  Service: Orthopedics;  Laterality:  Right;   AMPUTATION Left 08/21/2019   Procedure: LEFT FOOT 5TH RAY AMPUTATION;  Surgeon: Nadara Mustard, MD;  Location: [facility] OR;  Service: Orthopedics;  Laterality: Left;   AMPUTATION Left 11/13/2019   Procedure: LEFT BELOW KNEE AMPUTATION;  Surgeon: Nadara Mustard, MD;  Location: [facility] OR;  Service: Orthopedics;  Laterality: Left;   AV FISTULA PLACEMENT Right 09/08/2015   Procedure: INSERTION OF 4-44mm x 45cm  ARTERIOVENOUS (AV) GORE-TEX GRAFT RIGHT UPPER  ARM;  Surgeon: Chuck Hint, MD;  Location: [facility] OR;  Service: Vascular;  Laterality: Right;   AV FISTULA PLACEMENT Left 01/14/2016   Procedure: CREATION OF LEFT UPPER ARM ARTERIOVENOUS FISTULA;  Surgeon: Chuck Hint, MD;  Location: [facility] OR;  Service: Vascular;  Laterality: Left;   BASCILIC VEIN TRANSPOSITION Right 08/22/2014   Procedure: RIGHT UPPER ARM BASCILIC VEIN TRANSPOSITION;  Surgeon: Chuck Hint, MD;  Location: [facility];  Service: Vascular;  Laterality: Right;   BELOW KNEE LEG AMPUTATION Right 04/26/2016   CARDIAC CATHETERIZATION     CARDIAC DEFIBRILLATOR  PLACEMENT  06/27/2013   Sub Q       BY DR Graciela [facility]   CATARACT EXTRACTION W/PHACO Right 08/06/2018   Procedure: CATARACT EXTRACTION PHACO AND INTRAOCULAR LENS PLACEMENT (IOC);  Surgeon: Fabio Pierce, MD;  Location: [facility] ORS;  Service: Ophthalmology;  Laterality: Right;  CDE: 4.06   CATARACT EXTRACTION W/PHACO Left 08/20/2018   Procedure: CATARACT EXTRACTION PHACO AND INTRAOCULAR LENS PLACEMENT (IOC);  Surgeon: Fabio Pierce, MD;  Location: [facility] ORS;  Service: Ophthalmology;  Laterality: Left;  CDE: 6.76   COLONOSCOPY WITH PROPOFOL N/A 07/22/2015   Procedure: COLONOSCOPY WITH PROPOFOL;  Surgeon: Sherrilyn Rist, MD;  Location: [facility] ENDOSCOPY;  Service: Gastroenterology;  Laterality: N/A;   FEMORAL-POPLITEAL BYPASS GRAFT Right 03/31/2016   Procedure: BYPASS GRAFT FEMORAL-POPLITEAL ARTERY USING RIGHT GREATER SAPHENOUS NONREVERSED VEIN;  Surgeon: Chuck Hint, MD;  Location: [facility];  Service: Vascular;  Laterality: Right;   HERNIA REPAIR     I & D EXTREMITY Right 03/31/2016   Procedure: IRRIGATION AND DEBRIDEMENT FOOT;  Surgeon: Chuck Hint, MD;  Location: [facility] OR;  Service: Vascular;  Laterality: Right;   IMPLANTABLE CARDIOVERTER DEFIBRILLATOR IMPLANT N/A 06/27/2013   Procedure: SUB Q ICD;  Surgeon: Duke Salvia, MD;  Location: [facility] CATH LAB;  Service: Cardiovascular;  Laterality: N/A;   INTRAOPERATIVE ARTERIOGRAM Right 03/31/2016   Procedure: INTRA OPERATIVE ARTERIOGRAM;  Surgeon: Chuck Hint, MD;  Location: [facility] OR;  Service: Vascular;  Laterality: Right;   IR GENERIC HISTORICAL Right 11/30/2015   IR THROMBECTOMY AV FISTULA W/THROMBOLYSIS/PTA [facility]/SHUNT/IMG RIGHT 11/30/2015 Irish Lack, MD [facility] RAD   IR GENERIC HISTORICAL  11/30/2015   IR US GUIDE VASC ACCESS RIGHT 11/30/2015 Irish Lack, MD [facility]   IR GENERIC HISTORICAL Right 12/15/2015   IR THROMBECTOMY AV FISTULA W/THROMBOLYSIS/PTA/STENT [facility]/SHUNT/IMG RT 12/15/2015 Oley Balm, MD [facility]   IR GENERIC  HISTORICAL  12/15/2015   IR US GUIDE VASC ACCESS RIGHT 12/15/2015 Oley Balm, MD [facility]   IR GENERIC HISTORICAL  12/28/2015   IR FLUORO GUIDE CV LINE RIGHT 12/28/2015 Jolaine Click, MD [facility]   IR GENERIC HISTORICAL  12/28/2015   IR US GUIDE VASC ACCESS RIGHT 12/28/2015 Jolaine Click, MD [facility]   LEFT A ND RIGHT HEART CATH  01/30/2013   DR Jones Broom   LEFT AND RIGHT HEART CATHETERIZATION WITH CORONARY ANGIOGRAM N/A 01/30/2013   Procedure: LEFT AND RIGHT HEART CATHETERIZATION WITH CORONARY ANGIOGRAM;  Surgeon:  Dolores Patty, MD;  Location: [facility] C[facility] LAB;  Service: Cardiovascular;  Laterality: N/A;   PERIPHERAL VASCULAR CATHETERIZATION Right 01/26/2015   Procedure: A/V Fistulagram;  Surgeon: Chuck Hint, MD;  Location: [facility]IVE CV LAB;  Service: Cardiovascular;  Laterality: Right;   reapea urethral surgery for recurrent obstruction  2011   TOTAL KNEE ARTHROPLASTY Right 2007   VEIN HARVEST Right 03/31/2016   Procedure: RIGHT GREATER SAPHENOUS VEIN HARVEST;  Surgeon: Chuck Hint, MD;  Location: [facility] OR;  Service: Vascular;  Laterality: Right;     Home Medications:  Prior to Admission medications   Medication Sig Start Date End Date Taking? Authorizing Provider  acetaminophen (TYLENOL) 325 MG tablet Take 1-2 tablets (325-650 mg total) by mouth every 4 (four) hours as needed for mild pain. 11/29/19  Yes Love, Evlyn Kanner, PA-C  aspirin EC 81 MG tablet Take 81 mg by mouth daily.    Yes [provider]  atorvastatin (LIPITOR) 80 MG tablet TAKE 1 TABLET BY MOUTH AT BEDTIME 06/24/22  Yes Burchette, Elberta Fortis, MD  azelastine (ASTELIN) 0.1 % nasal spray Place 2 sprays into both nostrils 3 (three) times daily as needed for rhinitis. Use in each nostril as directed 03/10/14  Yes Burchette, Elberta Fortis, MD  CALCITRIOL PO Take by mouth 3 (three) times a week. ONLY PROVIDED AT DIALYSIS   Yes [provider]  carvedilol (COREG) 12.5 MG tablet Take 1 tablet by  mouth twice daily 06/09/22  Yes Burchette, Elberta Fortis, MD  cinacalcet (SENSIPAR) 30 MG tablet Take 1 tablet (30 mg total) by mouth Every Tuesday,Thursday,and Saturday with dialysis. 11/30/19  Yes Love, Evlyn Kanner, PA-C  erythromycin ophthalmic ointment Place 1 Application into the right eye at bedtime. 08/02/22  Yes [provider]  fenofibrate 160 MG tablet TAKE 1 TABLET BY MOUTH ONCE DAILY 06/24/22  Yes Burchette, Elberta Fortis, MD  fexofenadine (ALLEGRA) 180 MG tablet Take 180 mg by mouth daily as needed for allergies.   Yes [provider]  furosemide (LASIX) 40 MG tablet Take 3 tablets by mouth once daily 06/13/22  Yes Burchette, Elberta Fortis, MD  gabapentin (NEURONTIN) 300 MG capsule TAKE 2 CAPSULES BY MOUTH TWICE DAILY . 06/24/22  Yes Burchette, Elberta Fortis, MD  HUMALOG KWIKPEN 200 UNIT/ML KwikPen INJECT A MAXIMUM OF 28 UNITS SUBCUTANEOUSLY TWICE DAILY WITH LUNCH AND SUPPER PER SLIDING SCALE. APPOINTMENT REQUIRED FOR FUTURE REFILLS 08/15/22  Yes Burchette, Elberta Fortis, MD  lanthanum (FOSRENOL) 500 MG chewable tablet Chew 500-1,000 mg by mouth See admin instructions. Take 1000 mg with meals three time a day and 500 mg with snacks   Yes [provider]  LANTUS SOLOSTAR 100 UNIT/ML Solostar Pen INJECT 12 UNITS SUBCUTANEOUSLY AT BEDTIME 07/15/22  Yes Burchette, Elberta Fortis, MD  midodrine (PROAMATINE) 10 MG tablet Take 10 mg by mouth 3 (three) times a week. 08/04/20  Yes [provider]  multivitamin (RENA-VIT) TABS tablet Take 1 tablet by mouth once daily 07/06/21  Yes Burchette, Elberta Fortis, MD  nortriptyline (PAMELOR) 25 MG capsule Take 1 capsule (25 mg total) by mouth at bedtime. 08/09/22  Yes Sater, Pearletha Furl, MD  Olopatadine HCl 0.2 % SOLN Place 1 drop into both eyes daily as needed (for allergies).    Yes [provider]  tobramycin (TOBREX) 0.3 % ophthalmic solution Place 1 drop into the right eye every 6 (six) hours. 08/02/22  Yes [provider]  VENTOLIN HFA 108 (90 Base) MCG/ACT  inhaler Inhale 1-2 puffs into  the lungs every 6 (six) hours as needed for wheezing or shortness of breath. 07/25/22  Yes Burchette, Elberta Fortis, MD  Continuous Glucose Receiver (FREESTYLE LIBRE 3 READER) DEVI Use to check blood glucose TID 08/11/22   Burchette, Elberta Fortis, MD  Continuous Glucose Sensor (FREESTYLE LIBRE 14 DAY SENSOR) MISC USE AS DIRECTED EVERY 14 DAYS 05/11/22   Burchette, Elberta Fortis, MD  Continuous Glucose Sensor (FREESTYLE LIBRE 3 SENSOR) MISC Place 1 sensor on the skin every 14 days. Use to check glucose continuously 07/25/22   Burchette, Elberta Fortis, MD  glucose blood test strip Check 1 time daily. E11.9 One Touch Ultra Blue Test Strips 03/10/14   Burchette, Elberta Fortis, MD  Insulin Pen Needle (BD PEN NEEDLE NANO U/F) 32G X 4 MM MISC USE 1 PEN NEEDLE SUBCUTANEOUSLY WITH INSULIN 4 TIMES DAILY 12/04/19   Burchette, Elberta Fortis, MD    Inpatient Medications: Scheduled Meds:  aspirin EC  81 mg Oral Daily   atorvastatin  80 mg Oral QHS   carvedilol  25 mg Oral BID WC   Chlorhexidine Gluconate Cloth  6 each Topical Q0600   cinacalcet  30 mg Oral Q T,Th,Sa-HD   fenofibrate  160 mg Oral Daily   gabapentin  300 mg Oral BID   heparin  5,000 Units Subcutaneous Q8H   insulin aspart  0-9 Units Subcutaneous TID WC   insulin glargine-yfgn  12 Units Subcutaneous QHS   lanthanum  500 mg Oral TID WC   midodrine  10 mg Oral Q T,Th,Sa-HD   mometasone-formoterol  2 puff Inhalation BID   multivitamin  1 tablet Oral Daily   nortriptyline  25 mg Oral QHS   Continuous Infusions:  PRN Meds: acetaminophen, guaiFENesin-dextromethorphan, ipratropium-albuterol, ondansetron (ZOFRAN) IV  Allergies:    Allergies  Allergen Reactions   Epoetin Alfa Other (See Comments)    unknown   Ferumoxytol Other (See Comments)    unknown   Morphine Sulfate Rash and Other (See Comments)    Itches all over, red spots    Social History:   Social History   Socioeconomic History   Marital status: Married    Spouse name: Not on  file   Number of children: 0   Years of education: Not on file   Highest education level: Not on file  Occupational History   Not on file  Tobacco Use   Smoking status: Former    Current packs/day: 0.00    Average packs/day: 2.0 packs/day for 32.0 years (64.0 ttl pk-yrs)    Types: Cigarettes    Start date: 05/11/1978    Quit date: 05/11/2010    Years since quitting: 12.2   Smokeless tobacco: Never  Vaping Use   Vaping status: Never Used  Substance and Sexual Activity   Alcohol use: No   Drug use: No   Sexual activity: Yes  Other Topics Concern   Not on file  Social History Narrative   Works at [employer] as a Therapist, music      Right handed   Wears glasses    Drinks 10 oz coffee daily   Drinks 2 sodas per day   Social Determinants of Health   Financial Resource Strain: Low Risk  (01/24/2022)   Overall Financial Resource Strain (CARDIA)    Difficulty of Paying Living Expenses: Not hard at all  Food Insecurity: No Food Insecurity (08/21/2022)   Hunger Vital Sign    Worried About Running Out of Food in the Last Year: Never true  Ran Out of Food in the Last Year: Never true  Transportation Needs: No Transportation Needs (08/21/2022)   PRAPARE - Administrator, Civil Service (Medical): No    Lack of Transportation (Non-Medical): No  Physical Activity: Inactive (01/24/2022)   Exercise Vital Sign    Days of Exercise per Week: 0 days    Minutes of Exercise per Session: 0 min  Stress: No Stress Concern Present (01/24/2022)   [employer] of Occupational Health - Occupational Stress Questionnaire    Feeling of Stress : Not at all  Social Connections: Socially Integrated (01/24/2022)   Social Connection and Isolation Panel [NHANES]    Frequency of Communication with Friends and Family: More than three times a week    Frequency of Social Gatherings with Friends and Family: More than three times a week    Attends Religious Services: More than 4 times per year    Active  Member of [employer] or Organizations: Yes    Attends Engineer, structural: More than 4 times per year    Marital Status: Married  Catering manager Violence: Not At Risk (08/21/2022)   Humiliation, Afraid, Rape, and Kick questionnaire    Fear of Current or Ex-Partner: No    Emotionally Abused: No    Physically Abused: No    Sexually Abused: No    Family History:   Family History  Problem Relation Age of Onset   Bladder Cancer Mother    Alcohol abuse Father    Melanoma Father    Stroke Maternal Grandmother    Heart Problems Maternal Grandmother        unknown   Diabetes Maternal Grandmother    Heart disease Maternal Grandfather    Prostate cancer Maternal Grandfather      ROS:  Please see the history of present illness.  Review of Systems  Constitutional:  Negative for fever.  HENT:  Negative for congestion.   Respiratory:  Positive for shortness of breath.   Cardiovascular:  Positive for orthopnea. Negative for chest pain, palpitations, leg swelling and PND.  Gastrointestinal:  Negative for blood in stool, diarrhea, melena, nausea and vomiting.  Genitourinary:        Anuria due to ESRD  Musculoskeletal:  Negative for myalgias.  Neurological:  Positive for dizziness. Negative for loss of consciousness.  Endo/Heme/Allergies:  Does not bruise/bleed easily.  Psychiatric/Behavioral:  Negative for substance abuse.     All other ROS reviewed and negative.     Physical Exam/Data:   Vitals:   08/24/22 0859 08/24/22 1206 08/24/22 1207 08/24/22 1543  BP: (!) 119/37 (!) 99/52 (!) 99/52 91/64  Pulse:  74 73 75  Resp: 16 18 19 20   Temp:  (!) 96 F (35.6 C) (!) 97.5 F (36.4 C) 97.7 F (36.5 C)  TempSrc:  Axillary Oral Oral  SpO2:  99% 98% 96%  Weight:      Height:        Intake/Output Summary (Last 24 hours) at 08/24/2022 1632 Last data filed at 08/24/2022 0900 Gross per 24 hour  Intake 769.76 ml  Output -200 ml  Net 969.76 ml      08/23/2022    5:15 PM 08/23/2022     1:17 PM 08/22/2022    6:00 PM  Last 3 Weights  Weight (lbs) 196 lb 10.4 oz  195 lb 15.8 oz  194 lb 10.7 oz   Weight (kg) 89.2 kg  88.9 kg  88.3 kg      Significant value  Body mass index is 27.43 kg/m.  General: 62 y.o. Caucasian male resting comfortably in no acute distress. HEENT: Normocephalic and atraumatic. Sclera clear.  Neck: Supple. No JVD. Heart: RRR.  No murmurs, gallops, or rubs.  Lungs: No increased work of breathing. Clear to ausculation bilaterally. No wheezes, rhonchi, or rales.  Abdomen: Soft, non-distended, and non-tender to palpation.  Extremities: S/p bilateral BKAs. No lower extremity edema. Skin: Warm and dry. Neuro: Alert and oriented x3. No focal deficits. Psych: Normal affect. Responds appropriately.  EKG:  The EKG was personally reviewed and demonstrates:  EKG on 08/18/2022 showed sinus tachycardia, rate 104 bpm, with T wave inversions in lead aVL and known non-specific IVCD. EKG on 08/23/2022 showed atrial fibrillation, rate 117 bpm, with LBBB and nonspecific T wave changes.  Telemetry:  Telemetry was personally reviewed and demonstrates: Currently in normal sinus rhythm with rates in the 70s.  He had about a 12-hour episode of atrial fibrillation with his high as the 130s to 140s.  Relevant CV Studies:  Echocardiogram 08/22/2022: Impressions: 1. Left ventricular ejection fraction, by estimation, is 30 to 35%. The  left ventricle has moderately decreased function. The left ventricle  demonstrates global hypokinesis. There is moderate concentric left  ventricular hypertrophy. Left ventricular  diastolic parameters are consistent with Grade II diastolic dysfunction  (pseudonormalization). Elevated left ventricular end-diastolic pressure.   2. Right ventricular systolic function is normal. The right ventricular  size is normal.   3. Left atrial size was mildly dilated.   4. The mitral valve is normal in structure. Moderate mitral valve  regurgitation.  No evidence of mitral stenosis.   5. The aortic valve is normal in structure. Aortic valve regurgitation is  not visualized. Aortic valve sclerosis is present, with no evidence of  aortic valve stenosis.   6. The inferior vena cava is normal in size with greater than 50%  respiratory variability, suggesting right atrial pressure of 3 mmHg.    Laboratory Data:  High Sensitivity Troponin:   Recent Labs  Lab 08/18/22 2125 08/18/22 2307  TROPONINIHS 674* 702*     Chemistry Recent Labs  Lab 08/18/22 2125 08/20/22 0436 08/22/22 0209 08/23/22 0130 08/24/22 0115  NA 134*   < > 130* 132* 132*  K 5.0   < > 4.0 3.6 3.9  CL 97*   < > 91* 93* 94*  CO2 20*   < > 23 25 23   GLUCOSE 226*   < > 226* 180* 218*  BUN 66*   < > 54* 34* 28*  CREATININE 11.76*   < > 8.79* 6.63* 5.02*  CALCIUM 7.7*   < > 8.2* 8.4* 8.5*  MG 1.8  --   --   --   --   GFRNONAA 4*   < > 6* 9* 12*  ANIONGAP 17*   < > 16* 14 15   < > = values in this interval not displayed.    Recent Labs  Lab 08/18/22 2125 08/20/22 0436 08/22/22 0209 08/23/22 0130 08/24/22 0115  PROT 6.6  --   --   --   --   ALBUMIN 2.9*   < > 2.3* 2.3* 2.3*  AST 25  --   --   --   --   ALT 21  --   --   --   --   ALKPHOS 51  --   --   --   --   BILITOT 0.8  --   --   --   --    < > =  values in this interval not displayed.   Lipids No results for input(s): "CHOL", "TRIG", "HDL", "LABVLDL", "LDLCALC", "CHOLHDL" in the last 168 hours.  Hematology Recent Labs  Lab 08/20/22 0436 08/21/22 0409 08/22/22 0230  WBC 12.7* 11.5* 11.0*  RBC 3.73* 3.66* 3.63*  HGB 11.3* 11.0* 10.6*  HCT 34.5* 34.1* 32.9*  MCV 92.5 93.2 90.6  MCH 30.3 30.1 29.2  MCHC 32.8 32.3 32.2  RDW 15.1 15.4 15.0  PLT 114* 119* 141*   Thyroid No results for input(s): "TSH", "FREET4" in the last 168 hours.  BNP Recent Labs  Lab 08/18/22 2125  BNP 1,158.6*    DDimer No results for input(s): "DDIMER" in the last 168 hours.   Radiology/Studies:  ECHOCARDIOGRAM  COMPLETE  Result Date: 08/22/2022    ECHOCARDIOGRAM REPORT   Patient Name:   [facility] Date of Exam: 08/22/2022 Medical Rec #:  [id number]     Height:       71.0 in Accession #:    [id number]    Weight:       205.5 lb Date of Birth:  May 15, 1960     BSA:          2.133 m Patient Age:    61 years      BP:           115/64 mmHg Patient Gender: M             HR:           84 bpm. Exam Location:  Inpatient Procedure: 2D Echo, Color Doppler, Cardiac Doppler and Intracardiac            Opacification Agent Indications:    Dyspnea  History:        Patient has prior history of Echocardiogram examinations, most                 recent 09/03/2019. Cardiomyopathy and CHF, CAD, COPD; Risk                 Factors:Former Smoker, Diabetes, Dyslipidemia and Hypertension.                 ESRD. History of Cardiac Catheterization.  Sonographer:    Raeford Razor RDCS Referring Phys: [id number] MAURICIO DANIEL ARRIEN IMPRESSIONS  1. Left ventricular ejection fraction, by estimation, is 30 to 35%. The left ventricle has moderately decreased function. The left ventricle demonstrates global hypokinesis. There is moderate concentric left ventricular hypertrophy. Left ventricular diastolic parameters are consistent with Grade II diastolic dysfunction (pseudonormalization). Elevated left ventricular end-diastolic pressure.  2. Right ventricular systolic function is normal. The right ventricular size is normal.  3. Left atrial size was mildly dilated.  4. The mitral valve is normal in structure. Moderate mitral valve regurgitation. No evidence of mitral stenosis.  5. The aortic valve is normal in structure. Aortic valve regurgitation is not visualized. Aortic valve sclerosis is present, with no evidence of aortic valve stenosis.  6. The inferior vena cava is normal in size with greater than 50% respiratory variability, suggesting right atrial pressure of 3 mmHg. FINDINGS  Left Ventricle: Left ventricular ejection fraction, by estimation, is 30 to  35%. The left ventricle has moderately decreased function. The left ventricle demonstrates global hypokinesis. Definity contrast agent was given IV to delineate the left ventricular endocardial borders. The left ventricular internal cavity size was normal in size. There is moderate concentric left ventricular hypertrophy. Left ventricular diastolic parameters are consistent with Grade II diastolic dysfunction (pseudonormalization). Elevated left  ventricular end-diastolic pressure. Right Ventricle: The right ventricular size is normal. No increase in right ventricular wall thickness. Right ventricular systolic function is normal. Left Atrium: Left atrial size was mildly dilated. Right Atrium: Right atrial size was normal in size. Pericardium: There is no evidence of pericardial effusion. Presence of epicardial fat layer. Mitral Valve: The mitral valve is normal in structure. Moderate mitral valve regurgitation. No evidence of mitral valve stenosis. Tricuspid Valve: The tricuspid valve is normal in structure. Tricuspid valve regurgitation is not demonstrated. No evidence of tricuspid stenosis. Aortic Valve: The aortic valve is normal in structure. Aortic valve regurgitation is not visualized. Aortic valve sclerosis is present, with no evidence of aortic valve stenosis. Aortic valve peak gradient measures 2.4 mmHg. Pulmonic Valve: The pulmonic valve was not well visualized. Pulmonic valve regurgitation is trivial. No evidence of pulmonic stenosis. Aorta: The aortic root is normal in size and structure. Venous: The inferior vena cava is normal in size with greater than 50% respiratory variability, suggesting right atrial pressure of 3 mmHg. IAS/Shunts: No atrial level shunt detected by color flow Doppler.  LEFT VENTRICLE PLAX 2D LVIDd:         6.60 cm      Diastology LVIDs:         5.40 cm      LV e' medial:    5.66 cm/s LV PW:         1.40 cm      LV E/e' medial:  20.0 LV IVS:        1.40 cm      LV e' lateral:   6.53  cm/s LVOT diam:     2.10 cm      LV E/e' lateral: 17.3 LV SV:         48 LV SV Index:   22 LVOT Area:     3.46 cm  LV Volumes (MOD) LV vol d, MOD A2C: 110.0 ml LV vol d, MOD A4C: 195.0 ml LV vol s, MOD A2C: 71.9 ml LV vol s, MOD A4C: 134.0 ml LV SV MOD A2C:     38.1 ml LV SV MOD A4C:     195.0 ml LV SV MOD BP:      48.3 ml RIGHT VENTRICLE            IVC RV Basal diam:  2.70 cm    IVC diam: 2.00 cm RV S prime:     6.96 cm/s TAPSE (M-mode): 2.9 cm LEFT ATRIUM             Index        RIGHT ATRIUM           Index LA diam:        3.70 cm 1.73 cm/m   RA Area:     12.50 cm LA Vol (A2C):   82.8 ml 38.82 ml/m  RA Volume:   25.70 ml  12.05 ml/m LA Vol (A4C):   62.4 ml 29.26 ml/m LA Biplane Vol: 73.0 ml 34.23 ml/m  AORTIC VALVE AV Area (Vmax): 3.13 cm AV Vmax:        77.00 cm/s AV Peak Grad:   2.4 mmHg LVOT Vmax:      69.60 cm/s LVOT Vmean:     47.700 cm/s LVOT VTI:       0.138 m  AORTA Ao Root diam: 2.70 cm Ao Asc diam:  3.30 cm MITRAL VALVE MV Area (PHT): 6.37 cm     SHUNTS MV Decel Time: 119  msec     Systemic VTI:  0.14 m MR PISA:        1.57 cm    Systemic Diam: 2.10 cm MR PISA Radius: 0.50 cm MV E velocity: 113.00 cm/s MV A velocity: 72.30 cm/s MV E/A ratio:  1.56 Kardie Tobb DO Electronically signed by Thomasene Ripple DO Signature Date/Time: 08/22/2022/3:51:04 PM    Final      Assessment and Plan:   New Onset Atrial Fibrillation Patient presented with acute hypoxic respiratory failure secondary to acute CHF and was noted to go into new onset atrial fibrillation with RVR during dialysis session on 08/23/2022. He was started on IV Diltiazem but this had to be stopped due to hypotension. Echo this admission showed LVEF of 30-35% with global hypokinesis and grade 2 diastolic dysfunction. EF dropped from 50% in 2021.  - Currently in normal sinus rhythm with rates in the 70s. Per review of telemetry, it looks like he was in atrial fibrillation for about 12 hours. - Potassium 3.9 today.  - Magnesium 1.8 on  admission. - Will check TSH. - Home Coreg increase to 25mg  twice daily. Continue. Can hold this on morning of dialysis.  - CHA2DS2-VASc = 4 (CAD, CHF, HTN, DM). Will place on IV Heparin for now in case stress test is abnormal and he needs cardiac catheterization but will ultimately need Eliquis prior discharge. - Consider outpatient monitor at discharge to assess for atrial fibrillation burden.  CAD Elevated Troponin LHC in 2015 showed diffuse diabetic vasculopathy but only high-grade lesion was in a small distal LAD. Continued medical therapy was recommended at that time. EKG this admission shows no acute ischemic changes. High-sensitivity troponin 674 >> 702. Echo showed drop in EF to 30-35% (previously 50% in 2021). - No chest pain. - He has not had a repeat ischemic evaluation since 2018. Given drop in EF, will repeat Myoview. Will make NPO at midnight. - Continue aspirin for now but will plan to stop this at discharge when DOAC is started (unless Myoview abnormal and he undergoes PCI).  - Continue statin.  Acute on Chronic HFrEF Patient has a long history of CHF with EF previously as low as 25-30% in 2014. LHC in 2015 showed diffuse diabetic vasculopathy. Cardiomyopathy felt to be non-ischemic in nature. EF later normalized. Echo in 2021 showed LVEF of 50%. He presented with acute hypoxic respiratory failure secondary pulmonary edema. BNP 1,158. Echo this admission showed drop in EF to 30-35% with global hypokinesis and grade 2 diastolic dysfunction.  - Euvolemic on exam. - Volume status being managed with dialysis. - GDMT limited due to ESRD and hypotension with dialysis requiring Midodrine.  - Continue Coreg 25mg  twice daily. Continue to hold on morning of dialysis.   Hypertension Patient has a history of hypertension but struggles with hypotension during dialysis requiring Midodrine. BP ranging 67/55 to 155/63 over the last 2 days. - Continue Coreg as above. Can consider switching to  Toprol-XL if needed as this should not affect the BP as much  Hyperlipidemia - Continue Lipitor 80mg  daily.  ESRD On hemodialysis on Tuesday/ Thursday/ Saturday.  - Management per Nephrology.  Otherwise, per primary team: - Type 2 diabetes mellitus - PAD s/p bilateral BKAs - Carotid artery disease - COPD  Risk Assessment/Risk Scores:    New York Heart Association (NYHA) Functional Class NYHA Class II  CHA2DS2-VASc Score = 4  This indicates a 4.8% annual risk of stroke. The patient's score is based upon: CHF History: 1 HTN History:  1 Diabetes History: 1 Stroke History: 0 Vascular Disease History: 1 Age Score: 0 Gender Score: 0    For questions or updates, please contact [facility] Please consult [email or web address] for contact info under    Signed, Corrin Parker, PA-C  08/24/2022 4:32 PM

## 2022-08-24 NOTE — Progress Notes (Signed)
Physical Therapy Treatment Patient Details Name: Anthony Bullock MRN: [id number] DOB: 1960-08-09 Today's Date: 08/24/2022   History of Present Illness Pt is a 62 y/o M presenting to ED on 8/8 with SOB, volume overload. On 8/12, pt went into a fib with RVR after HD. PMH includes ESRD on HD TTS, HTN, HLD, DM2, insulin dependent with peripheral diabetic neuropahty, s/p bilateral BKA, anxiety, depression    PT Comments  Pt required min assist bed mobility. He donned BLE prosthesis sitting EOB supervision. Contact guard assist sit to stand and amb 125' with RW. Pt on 1L O2 on arrival, SpO2 100% at rest. Mobilized on RA with SpO2 93%. Pt stayed in NSR. HR into 90s. Pt in recliner at end of session. 1L O2 replaced.  Pt to follow acutely. No follow up services indicated.    If plan is discharge home, recommend the following: Assistance with cooking/housework;Assist for transportation;Help with stairs or ramp for entrance   Can travel by private vehicle        Equipment Recommendations  None recommended by PT    Recommendations for Other Services       Precautions / Restrictions Precautions Precautions: Fall Precaution Comments: bil BKA with prostheses in room, watch O2     Mobility  Bed Mobility Overal bed mobility: Needs Assistance Bed Mobility: Supine to Sit     Supine to sit: Min assist     General bed mobility comments: Assist to elevate trunk. Increased time.    Transfers Overall transfer level: Needs assistance Equipment used: Rolling walker (2 wheels) Transfers: Sit to/from Stand Sit to Stand: Contact guard assist           General transfer comment: increased time    Ambulation/Gait Ambulation/Gait assistance: Contact guard assist Gait Distance (Feet): 125 Feet Assistive device: Rolling walker (2 wheels) Gait Pattern/deviations: Step-through pattern, Decreased step length - left, Decreased step length - right, Decreased stride length Gait velocity:  decreased Gait velocity interpretation: <1.31 ft/sec, indicative of household ambulator   General Gait Details: Steady but guarded gait with RW and BLE prosthesis. Amb on RA with SpO2 93%.   Stairs             Wheelchair Mobility     Tilt Bed    Modified Rankin (Stroke Patients Only)       Balance Overall balance assessment: Needs assistance Sitting-balance support: Feet supported, No upper extremity supported Sitting balance-Leahy Scale: Good Sitting balance - Comments: Pt able to don BLE prosthesis sitting EOB supervision.   Standing balance support: During functional activity, Reliant on assistive device for balance, Bilateral upper extremity supported Standing balance-Leahy Scale: Poor                              Cognition Arousal: Alert Behavior During Therapy: WFL for tasks assessed/performed Overall Cognitive Status: Within Functional Limits for tasks assessed                                          Exercises      General Comments General comments (skin integrity, edema, etc.): Pt on 1L on arrival, SpO2 100% at rest. Mobilized on RA with SpO2 93%. Returned to 1L at end of session. Pt stayed in NSR. HR into 90s.      Pertinent Vitals/Pain Pain Assessment Pain Assessment: No/denies pain  Home Living                          Prior Function            PT Goals (current goals can now be found in the care plan section) Acute Rehab PT Goals Patient Stated Goal: home Progress towards PT goals: Progressing toward goals    Frequency    Min 1X/week      PT Plan      Co-evaluation              AM-PAC PT "6 Clicks" Mobility   Outcome Measure  Help needed turning from your back to your side while in a flat bed without using bedrails?: A Little Help needed moving from lying on your back to sitting on the side of a flat bed without using bedrails?: A Little Help needed moving to and from a bed to  a chair (including a wheelchair)?: A Little Help needed standing up from a chair using your arms (e.g., wheelchair or bedside chair)?: A Little Help needed to walk in hospital room?: A Little Help needed climbing 3-5 steps with a railing? : A Lot 6 Click Score: 17    End of Session Equipment Utilized During Treatment: Gait belt Activity Tolerance: Patient tolerated treatment well Patient left: in chair;with call bell/phone within reach Nurse Communication: Mobility status PT Visit Diagnosis: Unsteadiness on feet (R26.81);Other abnormalities of gait and mobility (R26.89);Difficulty in walking, not elsewhere classified (R26.2);Muscle weakness (generalized) (M62.81)     Time: 1000-1025 PT Time Calculation (min) (ACUTE ONLY): 25 min  Charges:    $Gait Training: 23-37 mins PT General Charges $$ ACUTE PT VISIT: 1 Visit                     Ferd Glassing., PT  Office # [phone]    Ilda Foil 08/24/2022, 12:01 PM

## 2022-08-24 NOTE — Plan of Care (Signed)
  Problem: Education: Goal: Ability to describe self-care measures that may prevent or decrease complications (Diabetes Survival Skills Education) will improve Outcome: Progressing   Problem: Fluid Volume: Goal: Ability to maintain a balanced intake and output will improve Outcome: Progressing   Problem: Health Behavior/Discharge Planning: Goal: Ability to identify and utilize available resources and services will improve Outcome: Progressing   Problem: Metabolic: Goal: Ability to maintain appropriate glucose levels will improve Outcome: Progressing   Problem: Education: Goal: Knowledge of General Education information will improve Description: Including pain rating scale, medication(s)/side effects and non-pharmacologic comfort measures Outcome: Progressing   Problem: Health Behavior/Discharge Planning: Goal: Ability to manage health-related needs will improve Outcome: Progressing

## 2022-08-24 NOTE — Progress Notes (Addendum)
TRH night cross cover note:   I was notified by RN that this patient's BP appears slightly lower than prior. ESRD patient who underwent HD this afternoon during which he went into A fib RVR prompting initiation of diltiazem drip.   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  Heart rates in the 90s to low 100s while on diltiazem drip.  Other vital signs appear stable, afebrile; respiratory rate 19-20, with oxygen saturations in the mid 90s on 2 L nasal cannula, unchanged from earlier in the day.   In the setting of concern for relative hypotension, diltiazem drip was subsequently stopped, with ensuing heart rates remaining in the 90s to low 100s, but without significant ensuing improvement in blood pressure.   Per chart review, there is documentation that while the patient originally came in with acute volume overload, that his wife status is now subsequently improved, will also noting that he did undergo hemodialysis earlier today.  In this context, we will initiate a very small IV fluid bolus and closely monitor ensuing trend and vital signs as well as respiratory status.  Specifically, I have ordered a 250 cc LR bolus.    Update: Following 250 cc LR bolus, manual BP reflects 110/74 mmHg, with HR in the 70's.     Newton Pigg, DO Hospitalist

## 2022-08-24 NOTE — Inpatient Diabetes Management (Signed)
Inpatient Diabetes Program Recommendations  AACE/ADA: New Consensus Statement on Inpatient Glycemic Control  Target Ranges:  Prepandial:   less than 140 mg/dL      Peak postprandial:   less than 180 mg/dL (1-2 hours)      Critically ill patients:  140 - 180 mg/dL    Review of Glycemic Control  Latest Reference Range & Units 08/23/22 06:29 08/23/22 12:15 08/23/22 17:19 08/23/22 21:58 08/24/22 06:22  Glucose-Capillary 70 - 99 mg/dL 578 (H) 469 (H) 629 (H) 189 (H) 161 (H)   Diabetes history: DM2 Outpatient Diabetes medications: Lantus 12 units QHS, Humalog 28 units BID (lunch and supper) Current orders for Inpatient glycemic control:  Semglee 12 units qhs Novolog 0-9 units tid  Inpatient Diabetes Program Recommendations:    -   consider ordering Novolog 2 units TID meal coverage if patient eats at least 50% of meals.  Thanks, Christena Deem RN, MSN, BC-ADM Inpatient Diabetes Coordinator Team Pager [phone] (8a-5p)

## 2022-08-25 ENCOUNTER — Inpatient Hospital Stay (HOSPITAL_COMMUNITY): Payer: Medicare Other

## 2022-08-25 ENCOUNTER — Encounter (HOSPITAL_COMMUNITY): Payer: Self-pay | Admitting: Internal Medicine

## 2022-08-25 DIAGNOSIS — I5020 Unspecified systolic (congestive) heart failure: Secondary | ICD-10-CM

## 2022-08-25 DIAGNOSIS — I10 Essential (primary) hypertension: Secondary | ICD-10-CM | POA: Diagnosis not present

## 2022-08-25 DIAGNOSIS — I2510 Atherosclerotic heart disease of native coronary artery without angina pectoris: Secondary | ICD-10-CM | POA: Diagnosis not present

## 2022-08-25 DIAGNOSIS — I5022 Chronic systolic (congestive) heart failure: Secondary | ICD-10-CM | POA: Diagnosis not present

## 2022-08-25 DIAGNOSIS — I4891 Unspecified atrial fibrillation: Secondary | ICD-10-CM | POA: Diagnosis not present

## 2022-08-25 DIAGNOSIS — Z992 Dependence on renal dialysis: Secondary | ICD-10-CM | POA: Diagnosis not present

## 2022-08-25 DIAGNOSIS — N186 End stage renal disease: Secondary | ICD-10-CM | POA: Diagnosis not present

## 2022-08-25 LAB — RENAL FUNCTION PANEL
Albumin: 2.4 g/dL — ABNORMAL LOW (ref 3.5–5.0)
Anion gap: 15 (ref 5–15)
BUN: 52 mg/dL — ABNORMAL HIGH (ref 6–20)
CO2: 23 mmol/L (ref 22–32)
Calcium: 8.6 mg/dL — ABNORMAL LOW (ref 8.9–10.3)
Chloride: 94 mmol/L — ABNORMAL LOW (ref 98–111)
Creatinine, Ser: 7.51 mg/dL — ABNORMAL HIGH (ref 0.61–1.24)
GFR, Estimated: 8 mL/min — ABNORMAL LOW (ref >60)
Glucose, Bld: 214 mg/dL — ABNORMAL HIGH (ref 70–99)
Phosphorus: 4.5 mg/dL (ref 2.5–4.6)
Potassium: 3.7 mmol/L (ref 3.5–5.1)
Sodium: 132 mmol/L — ABNORMAL LOW (ref 135–145)

## 2022-08-25 LAB — NM MYOCAR MULTI W/SPECT W/WALL MOTION / EF
Exercise duration (min): 5 min
MPHR: 160 {beats}/min
Peak HR: 95 {beats}/min
Percent HR: 59 %
Rest HR: 83 {beats}/min
ST Depression (mm): 0 mm

## 2022-08-25 LAB — CBC
HCT: 36.2 % — ABNORMAL LOW (ref 39.0–52.0)
Hemoglobin: 11.9 g/dL — ABNORMAL LOW (ref 13.0–17.0)
MCH: 30 pg (ref 26.0–34.0)
MCHC: 32.9 g/dL (ref 30.0–36.0)
MCV: 91.2 fL (ref 80.0–100.0)
Platelets: 160 10*3/uL (ref 150–400)
RBC: 3.97 MIL/uL — ABNORMAL LOW (ref 4.22–5.81)
RDW: 14.8 % (ref 11.5–15.5)
WBC: 8.9 10*3/uL (ref 4.0–10.5)
nRBC: 0 % (ref 0.0–0.2)

## 2022-08-25 LAB — TSH: TSH: 1.475 u[IU]/mL (ref 0.350–4.500)

## 2022-08-25 LAB — HEPARIN LEVEL (UNFRACTIONATED)
Heparin Unfractionated: 0.22 [IU]/mL — ABNORMAL LOW (ref 0.30–0.70)
Heparin Unfractionated: 0.35 [IU]/mL (ref 0.30–0.70)

## 2022-08-25 LAB — GLUCOSE, CAPILLARY
Glucose-Capillary: 185 mg/dL — ABNORMAL HIGH (ref 70–99)
Glucose-Capillary: 176 mg/dL — ABNORMAL HIGH (ref 70–99)
Glucose-Capillary: 123 mg/dL — ABNORMAL HIGH (ref 70–99)

## 2022-08-25 MED ORDER — APIXABAN 5 MG PO TABS
5.0000 mg | ORAL_TABLET | Freq: Two times a day (BID) | ORAL | Status: DC
  Administered 2022-08-25 – 2022-08-26 (×2): 5 mg via ORAL
  Filled 2022-08-25 (×3): qty 1

## 2022-08-25 MED ORDER — TECHNETIUM TC 99M TETROFOSMIN IV KIT
32.3000 | PACK | Freq: Once | INTRAVENOUS | Status: AC | PRN
  Administered 2022-08-25: 32.3 via INTRAVENOUS

## 2022-08-25 MED ORDER — TECHNETIUM TC 99M TETROFOSMIN IV KIT
10.7000 | PACK | Freq: Once | INTRAVENOUS | Status: AC | PRN
  Administered 2022-08-25: 10.7 via INTRAVENOUS

## 2022-08-25 MED ORDER — REGADENOSON 0.4 MG/5ML IV SOLN
INTRAVENOUS | Status: AC
  Filled 2022-08-25: qty 5

## 2022-08-25 MED ORDER — HEPARIN SODIUM (PORCINE) 1000 UNIT/ML IJ SOLN
INTRAMUSCULAR | Status: AC
  Filled 2022-08-25: qty 4

## 2022-08-25 MED ORDER — REGADENOSON 0.4 MG/5ML IV SOLN
0.4000 mg | Freq: Once | INTRAVENOUS | Status: AC
  Administered 2022-08-25: 0.4 mg via INTRAVENOUS
  Filled 2022-08-25: qty 5

## 2022-08-25 NOTE — Progress Notes (Signed)
ANTICOAGULATION CONSULT NOTE - Follow Up  Pharmacy Consult for transition heparin gtt >> apixaban Indication: New onset atrial fibrillation  Allergies  Allergen Reactions   Epoetin Alfa Other (See Comments)    unknown   Ferumoxytol Other (See Comments)    unknown   Morphine Sulfate Rash and Other (See Comments)    Itches all over, red spots    Patient Measurements: Height: 5\' 11"  (180.3 cm) Weight: (S) 89.2 kg (196 lb 10.4 oz) (Bed Scale) IBW/kg (Calculated) : 75.3 Heparin Dosing Weight: 89.2 kg  Vital Signs: Temp: 98 F (36.7 C) (08/15 1254) Temp Source: Oral (08/15 1254) BP: 139/50 (08/15 1700) Pulse Rate: 87 (08/15 1700)  Labs: Recent Labs    08/23/22 0130 08/24/22 0115 08/25/22 0150 08/25/22 1324  HGB  --   --  11.9*  --   HCT  --   --  36.2*  --   PLT  --   --  160  --   HEPARINUNFRC  --   --  0.22* 0.35  CREATININE 6.63* 5.02* 7.51*  --     Estimated Creatinine Clearance: 11.1 mL/min (A) (by C-G formula based on SCr of 7.51 mg/dL (H)).   Medical History: Past Medical History:  Diagnosis Date   AICD (automatic cardioverter/defibrillator) present    [employer]   Allergic rhinitis    Anemia    Arthritis    Chronic systolic heart failure (HCC)    a. ECHO (12/2012) EF 25-30%, HK entireanteroseptal myocardium //  b.  EF 25%, diffuse HK, grade 1 diastolic dysfunction, MAC, mild LAE, normal RVSF, trivial pericardial effusion   COPD (chronic obstructive pulmonary disease) (HCC)    Diabetes mellitus type II    Diabetic nephropathy (HCC)    Diabetic neuropathy (HCC)    ESRD on hemodialysis (HCC)    started HD June 2017, goes to [facility] HD unit, Dr Fausto Skillern   History of cardiac catheterization    a.Myoview 1/15:  There is significant left ventricular dysfunction. There may be slight scar at the apex. There is no significant ischemia. LV Ejection Fraction: 27%  //  b. RHC/LHC (1/15) with mean RA 6, PA 47/22 mean 33, mean PCWP 20, PVR 2.5 WU, CI 2.5;  80% dLAD stenosis, 70% diffuse large D.     History of kidney stones    Hyperlipidemia    Hypertension    Kidney stones    NICM (nonischemic cardiomyopathy) (HCC)    Primarily nonischemic.  Echo (12/14) with EF 25-30%.  Echo (3/15) with EF 25%, mild to moderately dilated LV, normal RV size and systolic function.     Osteomyelitis (HCC)    left fifth ray   Pneumonia    Urethral stricture    Wears glasses        Assessment: 61 YOM with new onset AF, CHADS2VASc =4 . He has no prior history of DOAC or coumadin usage noted. 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 CT head was unremarkable for CVA. CTA head / neck w/ contrast was also unremarkable.  Consult for Pharmacy to transition heparin infusion therapy to apixaban oral   Goal of Therapy:  Monitor platelets by anticoagulation protocol: Yes   Plan:  Discontinue Heparin infusion Start apixaban 5 mg po bid Monitor for signs of bleeding Pharmacy will sign off consult but continue to follow patient making recs prn Thank you  Greta Doom BS, PharmD, BCPS Clinical Pharmacist 08/25/2022 5:05 PM  Contact: [phone] after 3 PM  "Be curious, not judgmental..." -Debbora Dus

## 2022-08-25 NOTE — Progress Notes (Signed)
ANTICOAGULATION CONSULT NOTE - Follow Up  Pharmacy Consult for heparin gtt Indication: New onset atrial fibrillation  Allergies  Allergen Reactions   Epoetin Alfa Other (See Comments)    unknown   Ferumoxytol Other (See Comments)    unknown   Morphine Sulfate Rash and Other (See Comments)    Itches all over, red spots    Patient Measurements: Height: 5\' 11"  (180.3 cm) Weight: (S) 89.2 kg (196 lb 10.4 oz) (Bed Scale) IBW/kg (Calculated) : 75.3 Heparin Dosing Weight: 89.2 kg  Vital Signs: Temp: 98 F (36.7 C) (08/15 1254) Temp Source: Oral (08/15 1254) BP: 141/46 (08/15 1254) Pulse Rate: 89 (08/15 1254)  Labs: Recent Labs    08/23/22 0130 08/24/22 0115 08/25/22 0150 08/25/22 1324  HGB  --   --  11.9*  --   HCT  --   --  36.2*  --   PLT  --   --  160  --   HEPARINUNFRC  --   --  0.22* 0.35  CREATININE 6.63* 5.02* 7.51*  --     Estimated Creatinine Clearance: 11.1 mL/min (A) (by C-G formula based on SCr of 7.51 mg/dL (H)).   Medical History: Past Medical History:  Diagnosis Date   AICD (automatic cardioverter/defibrillator) present    [employer]   Allergic rhinitis    Anemia    Arthritis    Chronic systolic heart failure (HCC)    a. ECHO (12/2012) EF 25-30%, HK entireanteroseptal myocardium //  b.  EF 25%, diffuse HK, grade 1 diastolic dysfunction, MAC, mild LAE, normal RVSF, trivial pericardial effusion   COPD (chronic obstructive pulmonary disease) (HCC)    Diabetes mellitus type II    Diabetic nephropathy (HCC)    Diabetic neuropathy (HCC)    ESRD on hemodialysis (HCC)    started HD June 2017, goes to [facility] HD unit, Dr Fausto Skillern   History of cardiac catheterization    a.Myoview 1/15:  There is significant left ventricular dysfunction. There may be slight scar at the apex. There is no significant ischemia. LV Ejection Fraction: 27%  //  b. RHC/LHC (1/15) with mean RA 6, PA 47/22 mean 33, mean PCWP 20, PVR 2.5 WU, CI 2.5; 80% dLAD stenosis, 70%  diffuse large D.     History of kidney stones    Hyperlipidemia    Hypertension    Kidney stones    NICM (nonischemic cardiomyopathy) (HCC)    Primarily nonischemic.  Echo (12/14) with EF 25-30%.  Echo (3/15) with EF 25%, mild to moderately dilated LV, normal RV size and systolic function.     Osteomyelitis (HCC)    left fifth ray   Pneumonia    Urethral stricture    Wears glasses        Assessment: 61 YOM with new onset AF, CHADS2VASc =4 . He has no prior history of DOAC or coumadin usage noted. 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 CT head was unremarkable for CVA. CTA head / neck w/ contrast was also unremarkable.  Consult for Pharmacy to manage heparin infusion therapy.   Heparin level 0.35 is therapeutic on 1400 units/hr.  Goal of Therapy:  Heparin level 0.3-0.7 units/ml Monitor platelets by anticoagulation protocol: Yes   Plan:  Continue Heparin infusion 1400 units/hr Monitor daily heparin level, CBC, signs/symptoms of bleeding  F/u switch to DOAC  Anthony Bullock, PharmD, BCPS, BCCP Clinical Pharmacist  Please check AMION for all [facility]  phone numbers After 10:00 PM, call Main Pharmacy [phone]

## 2022-08-25 NOTE — Progress Notes (Signed)
PROGRESS NOTE    KAIREE AMICI  ZOX:[id number] DOB: [birth year] DOA: 08/18/2022 PCP: Kristian Covey, MD    No chief complaint on file.   Brief Narrative: Mr. Arseneau was admitted to the hospital with the working diagnosis of volume overload.    62 yo male with the past medical history of hypertension, hyperlipidemia, T2DM, bilateral BKA, and ESRD on HD who presented with dyspnea and cough. Patient developed acute dyspnea and cough. He went to his outpatient HD unit, [facility] [place]) he was found hypoxic, 02 saturation 70% on room air. He was placed on supplemental 02 per non re-brether 15L and was transferred to the ED. He reports not being compliant with salt or fluid restrictions at home.  In the ED his blood pressure was 139/71, HR 101, RR 20 and 02 saturation 97%, lungs with bilateral rhonchi, with no wheezing, heart with S1 and S2 present and regular with no gallops, abdomen with no distention, bilateral BKA.    Na 134, K 5,0 Cl 97, bicarbonate 20, glucose 226 bun 66 cr 11,7 Anion gap 17  BNP 1,158  High sensitive troponin 674 and 702  Lactic acid 1,2 and 2,9  Wbc 14,5 hgb 14.2    Chest radiograph from [facility] with reported pulmonary edema and small pleural effusions.    EKG 104 bpm, left axis deviation, left bundle branch block, sinus rhythm with left atrial enlargement, no significant ST segment or T wave changes.    08/08 Nephrology was consulted and recommendations for emergent hemodialysis.  08/12 had hemodialysis with ultrafiltration on consecutive days. 08/13 patient developed atrial fibrillation during HD. Treatment has held and patient had IV fluid bolus.  Patient subsequently placed on a Cardizem drip which was discontinued early hours of 08/24/2022 as patient with low blood pressure. Cardiology consulted.   Assessment & Plan:   Principal Problem:   ESRD on dialysis [facility]) Active Problems:   Chronic systolic congestive heart failure (HCC)   Pneumonia due to infectious  organism   Atrial fibrillation (HCC)   Essential hypertension   Type 2 diabetes mellitus with hyperlipidemia (HCC)   COPD (chronic obstructive pulmonary disease) (HCC)   Adjustment disorder with depressed mood   CAD (coronary artery disease)   S/P bilateral below knee amputation (HCC)  #1 acute hypoxemic respiratory failure secondary to volume overload -Patient had presented with volume overload with acute hypoxic respiratory failure. -Patient seen in consultation by nephrology and patient underwent hemodialysis with 1.1 L ultrafiltration removed. -Dyspnea/shortness of breath improved significantly post hemodialysis per patient. -Patient with sats of 93 to 95% on room air. -Repeat chest x-ray done with improvement with infiltrates but continued to have features of pulmonary edema. -2D echo done with EF of 30 to 35%, left ventricular global hypokinesis, moderate concentric LVH, grade 2 diastolic dysfunction, mildly dilated left atrial size, moderate MVR. -Due to abnormal 2D echo, new onset A-fib cardiology consulted for further evaluation and management. -Patient for hemodialysis today. -Nephrology following and appreciate input and recommendations.  2.  End-stage renal disease on HD -Patient noted to have gone into A-fib with RVR during routine hemodialysis on 08/23/2022. -Patient noted to have received IV metoprolol 500 cc normal saline bolus. -Patient currently noted to have received an extra HD due to volume overload on 08/22/2022. -Patient for HD today. -Per nephrology.  3.  New onset A-fib CHA2DS2VASC =4 -Patient noted to monitor A-fib while on hemodialysis yesterday 08/23/2022. -Patient noted to have received a 500 cc bolus, and received IV metoprolol 5  mg x 1 for rate control. -Patient subsequently had to be placed on a Cardizem drip yesterday for better rate control, however overnight patient noted to have soft/low blood pressure and Cardizem drip discontinued and patient received  a bolus of IV fluids. -Patient currently normal sinus rhythm rate currently controlled and as such we will discontinue Cardizem drip. -Patient with 2D echo done with EF of 30 to 35%, left ventricular global hypokinesis, moderate concentric LVH, grade 2 diastolic dysfunction, mildly dilated left atrial size, moderate MVR. -Due to abnormal 2D echo, new onset A-fib cardiology consulted for further evaluation and management. -Patient with no further hemoptysis and has been started on a heparin drip per cardiology. -Due to abnormal 2D echo patient underwent Lexiscan Myoview stress today.  Per cardiology Myoview stress test is negative heparin will be discontinued and patient will be started on Eliquis.   -Per cardiology.    4.  CAD/elevated troponin/abnormal 2D echo -Patient denies any ongoing chest pain however presented with shortness of breath, noted to be volume overloaded on exam and received hemodialysis with some improvement with pulmonary edema. -2D echo with a EF of 30 to 35%, previously was 50% in 2021, left ventricular global hypokinesis. -Cardiology consulted for further evaluation and management and patient underwent Myoview stress test this morning.   -Per cardiology if Myoview stress test is negative, heparin will be discontinued and patient started on Eliquis.   -Continue Coreg.   -Per cardiology.  5.  Acute on chronic systolic heart failure -See problem #1. -2D echo done with EF of 30 to 35%, left ventricular global hypokinesis, moderate concentric LVH, grade 2 diastolic dysfunction, mildly dilated left atrial size, moderate MVR. -Due to abnormal 2D echo, new onset A-fib cardiology consulted for further evaluation and management. -Patient underwent Lexiscan Myoview stress test this morning per cardiology.  -On HD for volume management.  6.  Pneumonia ruled out  7.  Hypertension -Currently on carvedilol. -Due to soft low blood pressure issues some of patient's cardiac  medications have been discontinued in the outpatient setting. -Patient receiving midodrine on HD days.  8.  Type 2 diabetes mellitus -Hemoglobin A1c 8.7 (06/24/2022) -CBG noted at 185 this morning. -Continue Semglee 12 units daily.  SSI.  9.  COPD -Stable. -Continue bronchodilator therapy and inhaled corticosteroids. -Pulmonary toileting with flutter valve and incentive spirometry.  10.  Adjustment disorder with depressed mood -Continue nortriptyline.  11.  Status post bilateral BKA -Patient with prosthesis for ambulation.    DVT prophylaxis: Heparin Code Status: Full Family Communication: Updated patient.  No family at bedside. Disposition: TBD  Status is: Inpatient Remains inpatient appropriate because: Severity of illness   Consultants:  Nephrology: Dr. Allena Katz 08/18/2022 Cardiology: Dr. Duke Salvia 08/24/2022  Procedures:  Chest x-ray 08/19/2022, 08/20/2022 2D echo 08/22/2022   Antimicrobials:  Anti-infectives (From admission, onward)    Start     Dose/Rate Route Frequency Ordered Stop   08/20/22 1000  azithromycin (ZITHROMAX) tablet 500 mg  Status:  Discontinued        500 mg Oral Daily 08/19/22 1520 08/19/22 1548   08/19/22 1000  azithromycin (ZITHROMAX) 500 mg in sodium chloride 0.9 % 250 mL IVPB  Status:  Discontinued        500 mg 250 mL/hr over 60 Minutes Intravenous Every 24 hours 08/18/22 2147 08/19/22 1520   08/19/22 0900  cefTRIAXone (ROCEPHIN) 2 g in sodium chloride 0.9 % 100 mL IVPB  Status:  Discontinued        2 g 200  mL/hr over 30 Minutes Intravenous Every 24 hours 08/18/22 2147 08/19/22 1548         Subjective: Patient laying in bed.  Just returned from Myoview stress test.  Denies any chest pain or shortness of breath.  No abdominal pain.  No palpitations.  States awaiting to go to hemodialysis now.    Objective: Vitals:   08/25/22 1134 08/25/22 1136 08/25/22 1138 08/25/22 1254  BP: (!) 131/37 133/61 132/63 (!) 141/46  Pulse: 92 92 95 89  Resp:     17  Temp:    98 F (36.7 C)  TempSrc:    Oral  SpO2:    93%  Weight:      Height:        Intake/Output Summary (Last 24 hours) at 08/25/2022 1304 Last data filed at 08/24/2022 1722 Gross per 24 hour  Intake 0.32 ml  Output 0 ml  Net 0.32 ml   Filed Weights   08/22/22 1800 08/23/22 1317 08/23/22 1715  Weight: (S) 88.3 kg (S) 88.9 kg (S) 89.2 kg    Examination:  General exam: NAD. Respiratory system: CTAB.  No wheezes, no crackles, no rhonchi.  Fair air movement.  Speaking in full sentences.   Cardiovascular system: Regular rate rhythm no murmurs rubs or gallops.  No JVD.  Gastrointestinal system: Abdomen is soft, nontender, nondistended, positive bowel sounds.  No rebound.  No guarding. Central nervous system: Alert and oriented. No focal neurological deficits. Extremities: Bilateral BKA.  Skin: No rashes, lesions or ulcers Psychiatry: Judgement and insight appear normal. Mood & affect appropriate.     Data Reviewed: I have personally reviewed following labs and imaging studies  CBC: Recent Labs  Lab 08/18/22 2125 08/20/22 0436 08/21/22 0409 08/22/22 0230 08/25/22 0150  WBC 14.5* 12.7* 11.5* 11.0* 8.9  NEUTROABS 12.7* 9.8*  --  8.6*  --   HGB 14.2 11.3* 11.0* 10.6* 11.9*  HCT 43.1 34.5* 34.1* 32.9* 36.2*  MCV 92.1 92.5 93.2 90.6 91.2  PLT PLATELET CLUMPS NOTED ON SMEAR, UNABLE TO ESTIMATE 114* 119* 141* 160    Basic Metabolic Panel: Recent Labs  Lab 08/18/22 2125 08/20/22 0436 08/21/22 0409 08/22/22 0209 08/23/22 0130 08/24/22 0115 08/25/22 0150  NA 134* 129* 130* 130* 132* 132* 132*  K 5.0 3.8 3.7 4.0 3.6 3.9 3.7  CL 97* 90* 89* 91* 93* 94* 94*  CO2 20* 23 25 23 25 23 23   GLUCOSE 226* 200* 251* 226* 180* 218* 214*  BUN 66* 40* 32* 54* 34* 28* 52*  CREATININE 11.76* 9.09* 6.58* 8.79* 6.63* 5.02* 7.51*  CALCIUM 7.7* 8.0* 8.2* 8.2* 8.4* 8.5* 8.6*  MG 1.8  --   --   --   --   --   --   PHOS  --  3.8  --  5.6* 3.6 4.1 4.5    GFR: Estimated  Creatinine Clearance: 11.1 mL/min (A) (by C-G formula based on SCr of 7.51 mg/dL (H)).  Liver Function Tests: Recent Labs  Lab 08/18/22 2125 08/20/22 0436 08/22/22 0209 08/23/22 0130 08/24/22 0115 08/25/22 0150  AST 25  --   --   --   --   --   ALT 21  --   --   --   --   --   ALKPHOS 51  --   --   --   --   --   BILITOT 0.8  --   --   --   --   --   PROT 6.6  --   --   --   --   --  ALBUMIN 2.9* 2.4* 2.3* 2.3* 2.3* 2.4*    CBG: Recent Labs  Lab 08/24/22 0622 08/24/22 1205 08/24/22 1542 08/24/22 2136 08/25/22 0602  GLUCAP 161* 278* 217* 194* 185*     Recent Results (from the past 240 hour(s))  Culture, blood (Routine X 2) w Reflex to ID Panel     Status: None   Collection Time: 08/18/22  9:25 PM   Specimen: BLOOD  Result Value Ref Range Status   Specimen Description BLOOD SITE NOT SPECIFIED  Final   Special Requests   Final    BOTTLES DRAWN AEROBIC AND ANAEROBIC Blood Culture adequate volume   Culture   Final    NO GROWTH 5 DAYS Performed at [facility], [address] [place], [place] [address]    Report Status 08/23/2022 FINAL  Final  Culture, blood (Routine X 2) w Reflex to ID Panel     Status: None   Collection Time: 08/18/22  9:30 PM   Specimen: BLOOD  Result Value Ref Range Status   Specimen Description BLOOD SITE NOT SPECIFIED  Final   Special Requests   Final    BOTTLES DRAWN AEROBIC AND ANAEROBIC Blood Culture adequate volume   Culture   Final    NO GROWTH 5 DAYS Performed at [facility], [address], [place], [place] [address]    Report Status 08/23/2022 FINAL  Final  Respiratory (~20 pathogens) panel by PCR     Status: None   Collection Time: 08/19/22 11:52 AM   Specimen: Nasopharyngeal Swab; Respiratory  Result Value Ref Range Status   Adenovirus NOT DETECTED NOT DETECTED Final   Coronavirus 229E NOT DETECTED NOT DETECTED Final    Comment: (NOTE) The Coronavirus on the Respiratory Panel, DOES NOT test for the novel   Coronavirus (2019 nCoV)    Coronavirus HKU1 NOT DETECTED NOT DETECTED Final   Coronavirus NL63 NOT DETECTED NOT DETECTED Final   Coronavirus OC43 NOT DETECTED NOT DETECTED Final   Metapneumovirus NOT DETECTED NOT DETECTED Final   Rhinovirus / Enterovirus NOT DETECTED NOT DETECTED Final   Influenza A NOT DETECTED NOT DETECTED Final   Influenza B NOT DETECTED NOT DETECTED Final   Parainfluenza Virus 1 NOT DETECTED NOT DETECTED Final   Parainfluenza Virus 2 NOT DETECTED NOT DETECTED Final   Parainfluenza Virus 3 NOT DETECTED NOT DETECTED Final   Parainfluenza Virus 4 NOT DETECTED NOT DETECTED Final   Respiratory Syncytial Virus NOT DETECTED NOT DETECTED Final   Bordetella pertussis NOT DETECTED NOT DETECTED Final   Bordetella Parapertussis NOT DETECTED NOT DETECTED Final   Chlamydophila pneumoniae NOT DETECTED NOT DETECTED Final   Mycoplasma pneumoniae NOT DETECTED NOT DETECTED Final    Comment: Performed at [facility], [address] [place], [place] [address]  SARS Coronavirus 2 by RT PCR (hospital order, performed in [facility] hospital lab) *cepheid single result test* Anterior Nasal Swab     Status: None   Collection Time: 08/19/22  1:50 PM   Specimen: Anterior Nasal Swab  Result Value Ref Range Status   SARS Coronavirus 2 by RT PCR NEGATIVE NEGATIVE Final    Comment: Performed at [facility], [address], [place], [place] [address]         Radiology Studies: DG CHEST PORT 1 VIEW  Result Date: 08/25/2022 CLINICAL DATA:  In stage kidney disease with volume access. EXAM: PORTABLE CHEST 1 VIEW COMPARISON:  07/19/2022 FINDINGS: Unchanged position of left-sided ICD lead. There  is a right chest wall dialysis catheter with tips in the superior cavoatrial junction and right atrium. Heart size is normal. Pulmonary vascular congestion is noted. No frank interstitial edema, pleural effusions or airspace disease. IMPRESSION: Pulmonary vascular congestion without frank  interstitial edema. Electronically Signed   By: Signa Kell M.D.   On: 08/25/2022 10:28        Scheduled Meds:  aspirin EC  81 mg Oral Daily   atorvastatin  80 mg Oral QHS   carvedilol  25 mg Oral BID WC   Chlorhexidine Gluconate Cloth  6 each Topical Q0600   cinacalcet  30 mg Oral Q T,Th,Sa-HD   fenofibrate  160 mg Oral Daily   gabapentin  300 mg Oral BID   insulin aspart  0-9 Units Subcutaneous TID WC   insulin glargine-yfgn  12 Units Subcutaneous QHS   lanthanum  500 mg Oral TID WC   midodrine  10 mg Oral Q T,Th,Sa-HD   mometasone-formoterol  2 puff Inhalation BID   multivitamin  1 tablet Oral Daily   nortriptyline  25 mg Oral QHS   Continuous Infusions:  heparin 1,400 Units/hr (08/25/22 1259)     LOS: 7 days    Time spent: 40 minutes    Ramiro Harvest, MD [facility]   To contact the attending provider between 7A-7P or the covering provider during after hours 7P-7A, please log into the web site [email or web address] and access using universal [place] password for that web site. If you do not have the password, please call the hospital operator.  08/25/2022, 1:04 PM

## 2022-08-25 NOTE — Progress Notes (Signed)
ANTICOAGULATION CONSULT NOTE - Follow Up  Pharmacy Consult for heparin gtt Indication: New onset atrial fibrillation  Allergies  Allergen Reactions   Epoetin Alfa Other (See Comments)    unknown   Ferumoxytol Other (See Comments)    unknown   Morphine Sulfate Rash and Other (See Comments)    Itches all over, red spots    Patient Measurements: Height: 5\' 11"  (180.3 cm) Weight: (S) 89.2 kg (196 lb 10.4 oz) (Bed Scale) IBW/kg (Calculated) : 75.3 Heparin Dosing Weight: 89.2 kg  Vital Signs: Temp: 97.9 F (36.6 C) (08/14 2339) Temp Source: Oral (08/14 2339) BP: 135/35 (08/14 2339) Pulse Rate: 84 (08/14 2339)  Labs: Recent Labs    08/23/22 0130 08/24/22 0115 08/25/22 0150  HGB  --   --  11.9*  HCT  --   --  36.2*  PLT  --   --  160  HEPARINUNFRC  --   --  0.22*  CREATININE 6.63* 5.02*  --     Estimated Creatinine Clearance: 16.7 mL/min (A) (by C-G formula based on SCr of 5.02 mg/dL (H)).   Medical History: Past Medical History:  Diagnosis Date   AICD (automatic cardioverter/defibrillator) present    [employer]   Allergic rhinitis    Anemia    Arthritis    Chronic systolic heart failure (HCC)    a. ECHO (12/2012) EF 25-30%, HK entireanteroseptal myocardium //  b.  EF 25%, diffuse HK, grade 1 diastolic dysfunction, MAC, mild LAE, normal RVSF, trivial pericardial effusion   COPD (chronic obstructive pulmonary disease) (HCC)    Diabetes mellitus type II    Diabetic nephropathy (HCC)    Diabetic neuropathy (HCC)    ESRD on hemodialysis (HCC)    started HD June 2017, goes to [facility] HD unit, Dr Fausto Skillern   History of cardiac catheterization    a.Myoview 1/15:  There is significant left ventricular dysfunction. There may be slight scar at the apex. There is no significant ischemia. LV Ejection Fraction: 27%  //  b. RHC/LHC (1/15) with mean RA 6, PA 47/22 mean 33, mean PCWP 20, PVR 2.5 WU, CI 2.5; 80% dLAD stenosis, 70% diffuse large D.     History of kidney  stones    Hyperlipidemia    Hypertension    Kidney stones    NICM (nonischemic cardiomyopathy) (HCC)    Primarily nonischemic.  Echo (12/14) with EF 25-30%.  Echo (3/15) with EF 25%, mild to moderately dilated LV, normal RV size and systolic function.     Osteomyelitis (HCC)    left fifth ray   Pneumonia    Urethral stricture    Wears glasses     Medications:  Medications Prior to Admission  Medication Sig Dispense Refill Last Dose   acetaminophen (TYLENOL) 325 MG tablet Take 1-2 tablets (325-650 mg total) by mouth every 4 (four) hours as needed for mild pain.   Unknown   aspirin EC 81 MG tablet Take 81 mg by mouth daily.    08/17/2022   atorvastatin (LIPITOR) 80 MG tablet TAKE 1 TABLET BY MOUTH AT BEDTIME 90 tablet 3 08/16/2022   azelastine (ASTELIN) 0.1 % nasal spray Place 2 sprays into both nostrils 3 (three) times daily as needed for rhinitis. Use in each nostril as directed 30 mL 12 Unknown   CALCITRIOL PO Take by mouth 3 (three) times a week. ONLY PROVIDED AT DIALYSIS   08/16/2022   carvedilol (COREG) 12.5 MG tablet Take 1 tablet by mouth twice daily 180  tablet 0 08/17/2022   cinacalcet (SENSIPAR) 30 MG tablet Take 1 tablet (30 mg total) by mouth Every Tuesday,Thursday,and Saturday with dialysis. 60 tablet  08/16/2022   erythromycin ophthalmic ointment Place 1 Application into the right eye at bedtime.   08/16/2022   fenofibrate 160 MG tablet TAKE 1 TABLET BY MOUTH ONCE DAILY 90 tablet 3 08/17/2022   fexofenadine (ALLEGRA) 180 MG tablet Take 180 mg by mouth daily as needed for allergies.   Unknown   furosemide (LASIX) 40 MG tablet Take 3 tablets by mouth once daily 270 tablet 0 08/17/2022   gabapentin (NEURONTIN) 300 MG capsule TAKE 2 CAPSULES BY MOUTH TWICE DAILY . 360 capsule 3 08/17/2022   HUMALOG KWIKPEN 200 UNIT/ML KwikPen INJECT A MAXIMUM OF 28 UNITS SUBCUTANEOUSLY TWICE DAILY WITH LUNCH AND SUPPER PER SLIDING SCALE. APPOINTMENT REQUIRED FOR FUTURE REFILLS 6 mL 2 08/17/2022   lanthanum  (FOSRENOL) 500 MG chewable tablet Chew 500-1,000 mg by mouth See admin instructions. Take 1000 mg with meals three time a day and 500 mg with snacks   08/17/2022   LANTUS SOLOSTAR 100 UNIT/ML Solostar Pen INJECT 12 UNITS SUBCUTANEOUSLY AT BEDTIME 15 mL 0 08/16/2022   midodrine (PROAMATINE) 10 MG tablet Take 10 mg by mouth 3 (three) times a week.   Past Week   multivitamin (RENA-VIT) TABS tablet Take 1 tablet by mouth once daily 10 tablet 3 08/17/2022   nortriptyline (PAMELOR) 25 MG capsule Take 1 capsule (25 mg total) by mouth at bedtime. 30 capsule 3 08/16/2022   Olopatadine HCl 0.2 % SOLN Place 1 drop into both eyes daily as needed (for allergies).    Past Week   tobramycin (TOBREX) 0.3 % ophthalmic solution Place 1 drop into the right eye every 6 (six) hours.   08/17/2022   VENTOLIN HFA 108 (90 Base) MCG/ACT inhaler Inhale 1-2 puffs into the lungs every 6 (six) hours as needed for wheezing or shortness of breath. 18 g 1 08/17/2022   Continuous Glucose Receiver (FREESTYLE LIBRE 3 READER) DEVI Use to check blood glucose TID 1 each 0    Continuous Glucose Sensor (FREESTYLE LIBRE 14 DAY SENSOR) MISC USE AS DIRECTED EVERY 14 DAYS 6 each 3    Continuous Glucose Sensor (FREESTYLE LIBRE 3 SENSOR) MISC Place 1 sensor on the skin every 14 days. Use to check glucose continuously 2 each 11    glucose blood test strip Check 1 time daily. E11.9 One Touch Ultra Blue Test Strips 100 each 3    Insulin Pen Needle (BD PEN NEEDLE NANO U/F) 32G X 4 MM MISC USE 1 PEN NEEDLE SUBCUTANEOUSLY WITH INSULIN 4 TIMES DAILY 400 each 0    Scheduled:   aspirin EC  81 mg Oral Daily   atorvastatin  80 mg Oral QHS   carvedilol  25 mg Oral BID WC   Chlorhexidine Gluconate Cloth  6 each Topical Q0600   cinacalcet  30 mg Oral Q T,Th,Sa-HD   fenofibrate  160 mg Oral Daily   gabapentin  300 mg Oral BID   insulin aspart  0-9 Units Subcutaneous TID WC   insulin glargine-yfgn  12 Units Subcutaneous QHS   lanthanum  500 mg Oral TID WC    midodrine  10 mg Oral Q T,Th,Sa-HD   mometasone-formoterol  2 puff Inhalation BID   multivitamin  1 tablet Oral Daily   nortriptyline  25 mg Oral QHS    Assessment: 61 YOM with new onset AF. He has no prior history of DOAC or  coumadin usage noted. 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 CT head was unremarkable for CVA. CTA head / neck w/ contrast was also unremarkable for brain mass, hemorrhage, acute / chronic infarct.   Cardiology placed a consult for Pharmacy to manage heparin infusion therapy.   8/15 AM: heparin level returned at 0.22 (subtherapeutic) on 1200 units/hr. Per RN, no issues with heparin infusion running continuously or signs/symptoms of bleeding. Hgb 11.9  Goal of Therapy:  Heparin level 0.3-0.7 units/ml Monitor platelets by anticoagulation protocol: Yes   Plan:  Increase heparin infusion to 1400 units/hr Check heparin level in 8 hours and daily while on heparin Continue to monitor H&H and platelets Follow- up on converting to a DOAC at a later time. Copay check for apixaban initiated  Arabella Merles, PharmD. Clinical Pharmacist 08/25/2022 2:49 AM

## 2022-08-25 NOTE — Progress Notes (Signed)
Rounding Note    Patient Name: Anthony Bullock Date of Encounter: 08/25/2022  [facility] Cardiologist: Dina Rich, MD   Subjective   Patient seen while down in nuclear medicine.  No specific complaints.  Inpatient Medications    Scheduled Meds:  aspirin EC  81 mg Oral Daily   atorvastatin  80 mg Oral QHS   carvedilol  25 mg Oral BID WC   Chlorhexidine Gluconate Cloth  6 each Topical Q0600   cinacalcet  30 mg Oral Q T,Th,Sa-HD   fenofibrate  160 mg Oral Daily   gabapentin  300 mg Oral BID   insulin aspart  0-9 Units Subcutaneous TID WC   insulin glargine-yfgn  12 Units Subcutaneous QHS   lanthanum  500 mg Oral TID WC   midodrine  10 mg Oral Q T,Th,Sa-HD   mometasone-formoterol  2 puff Inhalation BID   multivitamin  1 tablet Oral Daily   nortriptyline  25 mg Oral QHS   Continuous Infusions:  heparin 1,400 Units/hr (08/25/22 0254)   PRN Meds: acetaminophen, guaiFENesin-dextromethorphan, ipratropium-albuterol, ondansetron (ZOFRAN) IV   Vital Signs    Vitals:   08/25/22 1123 08/25/22 1134 08/25/22 1136 08/25/22 1138  BP: (!) 120/38 (!) 131/37 133/61 132/63  Pulse: 85 92 92 95  Resp:      Temp:      TempSrc:      SpO2:      Weight:      Height:        Intake/Output Summary (Last 24 hours) at 08/25/2022 1204 Last data filed at 08/24/2022 1722 Gross per 24 hour  Intake 0.32 ml  Output 0 ml  Net 0.32 ml      08/23/2022    5:15 PM 08/23/2022    1:17 PM 08/22/2022    6:00 PM  Last 3 Weights  Weight (lbs) 196 lb 10.4 oz  195 lb 15.8 oz  194 lb 10.7 oz   Weight (kg) 89.2 kg  88.9 kg  88.3 kg      Significant value      ECG    Sinus rhythm, left bundle branch block- Personally Reviewed  Physical Exam   GEN: No acute distress.   Neck: No JVD Cardiac: RRR, no murmurs, rubs, or gallops.  Respiratory: Clear to auscultation bilaterally. GI: Soft, nontender, non-distended  MS: s/p bilateral BKA's Neuro:  Nonfocal  Psych: Normal affect    Labs    High Sensitivity Troponin:   Recent Labs  Lab 08/18/22 2125 08/18/22 2307  TROPONINIHS 674* 702*     Chemistry Recent Labs  Lab 08/18/22 2125 08/20/22 0436 08/23/22 0130 08/24/22 0115 08/25/22 0150  NA 134*   < > 132* 132* 132*  K 5.0   < > 3.6 3.9 3.7  CL 97*   < > 93* 94* 94*  CO2 20*   < > 25 23 23   GLUCOSE 226*   < > 180* 218* 214*  BUN 66*   < > 34* 28* 52*  CREATININE 11.76*   < > 6.63* 5.02* 7.51*  CALCIUM 7.7*   < > 8.4* 8.5* 8.6*  MG 1.8  --   --   --   --   PROT 6.6  --   --   --   --   ALBUMIN 2.9*   < > 2.3* 2.3* 2.4*  AST 25  --   --   --   --   ALT 21  --   --   --   --  ALKPHOS 51  --   --   --   --   BILITOT 0.8  --   --   --   --   GFRNONAA 4*   < > 9* 12* 8*  ANIONGAP 17*   < > 14 15 15    < > = values in this interval not displayed.    Lipids No results for input(s): "CHOL", "TRIG", "HDL", "LABVLDL", "LDLCALC", "CHOLHDL" in the last 168 hours.  Hematology Recent Labs  Lab 08/21/22 0409 08/22/22 0230 08/25/22 0150  WBC 11.5* 11.0* 8.9  RBC 3.66* 3.63* 3.97*  HGB 11.0* 10.6* 11.9*  HCT 34.1* 32.9* 36.2*  MCV 93.2 90.6 91.2  MCH 30.1 29.2 30.0  MCHC 32.3 32.2 32.9  RDW 15.4 15.0 14.8  PLT 119* 141* 160   Thyroid  Recent Labs  Lab 08/25/22 0150  TSH 1.475    BNP Recent Labs  Lab 08/18/22 2125  BNP 1,158.6*    DDimer No results for input(s): "DDIMER" in the last 168 hours.   Radiology    DG CHEST PORT 1 VIEW  Result Date: 08/25/2022 CLINICAL DATA:  In stage kidney disease with volume access. EXAM: PORTABLE CHEST 1 VIEW COMPARISON:  07/19/2022 FINDINGS: Unchanged position of left-sided ICD lead. There is a right chest wall dialysis catheter with tips in the superior cavoatrial junction and right atrium. Heart size is normal. Pulmonary vascular congestion is noted. No frank interstitial edema, pleural effusions or airspace disease. IMPRESSION: Pulmonary vascular congestion without frank interstitial edema. Electronically  Signed   By: Signa Kell M.D.   On: 08/25/2022 10:28    Cardiac Studies   Echocardiogram 08/22/2022: Impressions: 1. Left ventricular ejection fraction, by estimation, is 30 to 35%. The  left ventricle has moderately decreased function. The left ventricle  demonstrates global hypokinesis. There is moderate concentric left  ventricular hypertrophy. Left ventricular  diastolic parameters are consistent with Grade II diastolic dysfunction  (pseudonormalization). Elevated left ventricular end-diastolic pressure.   2. Right ventricular systolic function is normal. The right ventricular  size is normal.   3. Left atrial size was mildly dilated.   4. The mitral valve is normal in structure. Moderate mitral valve  regurgitation. No evidence of mitral stenosis.   5. The aortic valve is normal in structure. Aortic valve regurgitation is  not visualized. Aortic valve sclerosis is present, with no evidence of  aortic valve stenosis.   6. The inferior vena cava is normal in size with greater than 50%  respiratory variability, suggesting right atrial pressure of 3 mmHg.   Patient Profile     62 y.o. male with a history of diffuse nonobstructive CAD on cardiac catheterization in 2015, chronic at HFrEF with EF a low a 25-30% in 2014 with subsequent normalization, s/p ICD in 06/2013 for primary prevention of sudden cardiac death but did no have generator change given EF recovered and now ICD is inactive, PAD s/p bilateral below knee amputations (right in 2017 and left in 2021), mild bilateral carotid stenosis, COPD, hypertension, hyperlipidemia, type 2 diabetes mellitus with diabetic neuropathy and nephropathy, ESRD on hemodialysis T/Th/ Sat, and remote tobacco use (quit around 13 years ago) who is being seen 08/24/2022 for the evaluation of atrial fibrillation at the request of Dr. Janee Morn.   Assessment & Plan    New Onset Atrial Fibrillation -- Patient presented with acute hypoxic respiratory failure  secondary to acute CHF and was noted to go into new onset atrial fibrillation with RVR during dialysis session  on 08/23/2022. He was started on IV Diltiazem but this had to be stopped due to hypotension. Echo this admission showed LVEF of 30-35% with global hypokinesis and grade 2 diastolic dysfunction. EF dropped from 50% in 2021.  -- Maintaining sinus rhythm on down a nuclear medicine -- Home Coreg increase to 25mg  twice daily. Continue. Can hold this on dialysis days -- CHA2DS2-VASc = 4 (CAD, CHF, HTN, DM).  Currently on IV heparin, pending Lexiscan Myoview results. -- Consider outpatient monitor at discharge to assess for atrial fibrillation burden.   CAD Elevated Troponin -- LHC in 2015 showed diffuse diabetic vasculopathy but only high-grade lesion was in a small distal LAD. Continued medical therapy was recommended at that time. EKG this admission shows no acute ischemic changes. High-sensitivity troponin 674 >> 702. Echo showed drop in EF to 30-35% (previously 50% in 2021). --No chest pain, or complications noted during Lexiscan Myoview -- Continue aspirin for now but will plan to stop this at discharge when DOAC is started (unless Myoview abnormal and he undergoes PCI).  -- Continue statin.   Acute on Chronic HFrEF -- Patient has a long history of CHF with EF previously as low as 25-30% in 2014. LHC in 2015 showed diffuse diabetic vasculopathy. Cardiomyopathy felt to be non-ischemic in nature. EF later normalized. Echo in 2021 showed LVEF of 50%. He presented with acute hypoxic respiratory failure secondary pulmonary edema. BNP 1,158. Echo this admission showed drop in EF to 30-35% with global hypokinesis and grade 2 diastolic dysfunction.  -- Remains euvolemic on exam, volume management per HD -- GDMT limited due to ESRD and hypotension with dialysis requiring Midodrine.    Hypertension -- Patient has a history of hypertension but struggles with hypotension during dialysis requiring  Midodrine. BP ranging 67/55 to 155/63 over the last 2 days. -- Continue Coreg as above. Can consider switching to Toprol-XL if needed as this should not affect the BP as much   Hyperlipidemia -- Continue Lipitor 80mg  daily.   ESRD -- On hemodialysis on Tuesday/ Thursday/ Saturday.  -- Management per Nephrology.   Per primary team: Type 2 diabetes mellitus PAD s/p bilateral BKAs Carotid artery disease COPD   For questions or updates, please contact [facility] Please consult [email or web address] for contact info under        Signed, Laverda Page, NP  08/25/2022, 12:04 PM

## 2022-08-25 NOTE — Progress Notes (Signed)
Mobility Specialist Progress Note:    08/25/22 1436  Mobility  Activity Refused mobility   Pt refused mobility d/t fatigue. Will f/u as able.    Leory Plowman  Mobility Specialist Please contact via [employer] office at [phone]

## 2022-08-25 NOTE — Progress Notes (Signed)
[facility] Progress Note   Subjective:    Seen and examined patient at bedside. Recently completed stress test today. Cardiology following and on IV Heparin. He feels well. HR and volume remain stable, on RA. He denies SOB, CP, palpitations, and N/V. Plan for HD this afternoon. No UF, will run even.  Objective Vitals:   08/25/22 1134 08/25/22 1136 08/25/22 1138 08/25/22 1254  BP: (!) 131/37 133/61 132/63 (!) 141/46  Pulse: 92 92 95 89  Resp:    17  Temp:    98 F (36.7 C)  TempSrc:    Oral  SpO2:    93%  Weight:      Height:       Physical Exam General: Alert male in NAD Heart: RRR, no murmurs, rubs or gallops Lungs: CTA bilaterally but diminished in bases. On RA, respirations unlabored Abdomen: Soft, non-distended, +BS Extremities: B/l BKA, no BL stump edema appreciated Dialysis Access:  [facility]  Filed Weights   08/22/22 1800 08/23/22 1317 08/23/22 1715  Weight: (S) 88.3 kg (S) 88.9 kg (S) 89.2 kg    Intake/Output Summary (Last 24 hours) at 08/25/2022 1317 Last data filed at 08/25/2022 1300 Gross per 24 hour  Intake 240.32 ml  Output 0 ml  Net 240.32 ml    Additional Objective Labs: Basic Metabolic Panel: Recent Labs  Lab 08/23/22 0130 08/24/22 0115 08/25/22 0150  NA 132* 132* 132*  K 3.6 3.9 3.7  CL 93* 94* 94*  CO2 25 23 23   GLUCOSE 180* 218* 214*  BUN 34* 28* 52*  CREATININE 6.63* 5.02* 7.51*  CALCIUM 8.4* 8.5* 8.6*  PHOS 3.6 4.1 4.5   Liver Function Tests: Recent Labs  Lab 08/18/22 2125 08/20/22 0436 08/23/22 0130 08/24/22 0115 08/25/22 0150  AST 25  --   --   --   --   ALT 21  --   --   --   --   ALKPHOS 51  --   --   --   --   BILITOT 0.8  --   --   --   --   PROT 6.6  --   --   --   --   ALBUMIN 2.9*   < > 2.3* 2.3* 2.4*   < > = values in this interval not displayed.   No results for input(s): "LIPASE", "AMYLASE" in the last 168 hours. CBC: Recent Labs  Lab 08/18/22 2125 08/20/22 0436 08/21/22 0409 08/22/22 0230  08/25/22 0150  WBC 14.5* 12.7* 11.5* 11.0* 8.9  NEUTROABS 12.7* 9.8*  --  8.6*  --   HGB 14.2 11.3* 11.0* 10.6* 11.9*  HCT 43.1 34.5* 34.1* 32.9* 36.2*  MCV 92.1 92.5 93.2 90.6 91.2  PLT PLATELET CLUMPS NOTED ON SMEAR, UNABLE TO ESTIMATE 114* 119* 141* 160   Blood Culture    Component Value Date/Time   SDES BLOOD SITE NOT SPECIFIED 08/18/2022 2130   SPECREQUEST  08/18/2022 2130    BOTTLES DRAWN AEROBIC AND ANAEROBIC Blood Culture adequate volume   CULT  08/18/2022 2130    NO GROWTH 5 DAYS Performed at [facility], [address] [place], [place] [address]    REPTSTATUS 08/23/2022 FINAL 08/18/2022 2130    Cardiac Enzymes: No results for input(s): "CKTOTAL", "CKMB", "CKMBINDEX", "TROPONINI" in the last 168 hours. CBG: Recent Labs  Lab 08/24/22 1205 08/24/22 1542 08/24/22 2136 08/25/22 0602 08/25/22 1306  GLUCAP 278* 217* 194* 185* 176*   Iron Studies: No results for input(s): "IRON", "  TIBC", "TRANSFERRIN", "FERRITIN" in the last 72 hours. Lab Results  Component Value Date   INR 1.2 08/05/2022   INR 1.1 08/17/2019   INR 1.0 11/06/2017   Studies/Results: DG CHEST PORT 1 VIEW  Result Date: 08/25/2022 CLINICAL DATA:  In stage kidney disease with volume access. EXAM: PORTABLE CHEST 1 VIEW COMPARISON:  07/19/2022 FINDINGS: Unchanged position of left-sided ICD lead. There is a right chest wall dialysis catheter with tips in the superior cavoatrial junction and right atrium. Heart size is normal. Pulmonary vascular congestion is noted. No frank interstitial edema, pleural effusions or airspace disease. IMPRESSION: Pulmonary vascular congestion without frank interstitial edema. Electronically Signed   By: Signa Kell M.D.   On: 08/25/2022 10:28    Medications:  heparin 1,400 Units/hr (08/25/22 1259)    aspirin EC  81 mg Oral Daily   atorvastatin  80 mg Oral QHS   carvedilol  25 mg Oral BID WC   Chlorhexidine Gluconate Cloth  6 each Topical Q0600   cinacalcet  30  mg Oral Q T,Th,Sa-HD   fenofibrate  160 mg Oral Daily   gabapentin  300 mg Oral BID   insulin aspart  0-9 Units Subcutaneous TID WC   insulin glargine-yfgn  12 Units Subcutaneous QHS   lanthanum  500 mg Oral TID WC   midodrine  10 mg Oral Q T,Th,Sa-HD   mometasone-formoterol  2 puff Inhalation BID   multivitamin  1 tablet Oral Daily   nortriptyline  25 mg Oral QHS    Dialysis Orders: [facility] TTS, EDW 91.5 kg   Assessment/Plan: 1.  Acute hypoxic respiratory failure: Possibly multifactorial but likely from volume overload in this patient with COPD, pneumonia less likely based on clinical picture. Serial HD, improving and O2 requirement is going down. Na 132 after extra HD yesterday. Next HD today per his usual schedule. 2.  End-stage renal disease: Patient went into Afib with RVR during his routine HD 8/13. 500cc NS bolus and Metoprolol 5mg  IV given. Appreciate primary and Cardiology's assistance. Unclear what precipitated the Afib: volume status vs something else. K+ remains stable. He was volume overloaded on exam requiring an extra HD 8/12. Cardizem drip remains off and currently in SR.  3. New onset Afib - See above. Cardizem drip remains off (2nd hypotension) and in SR. Coreg raised to 25mg  BID and stress test completed today, continue ASA and statin, appreciate Cardiology's assistance. 4.  Hypotension: Blood pressure improved with HD, continue carvedilol and midodrine pre-HD. Hold Midodrine for SBP > 110. 5.  Anemia of chronic disease: Hgb at goal, no Fe or ESA is indicated at this time 6.  Secondary hyperparathyroidism: Calcium and phos controlled. Continue binders and Sensipar  7. Dispo - Remains inpatient (see above). Continue to feel well this morning. BP is stable and back in SR. Cardizem off. Will await primary and cardiology's approval for discharge.  [name], NP [facility] 08/25/2022,1:17 PM  LOS: 7 days

## 2022-08-25 NOTE — Procedures (Signed)
Received patient in bed to unit.  Alert and oriented.  Informed consent signed and in chart.   Patient alert and oriented when arriving on the unit.    See flowsheet for details  Access used: Upper left chest HD catheter. Access issues: None  [facility]

## 2022-08-25 NOTE — Progress Notes (Signed)
Pt came back to rm 12 from stress test. Reinitiated tele. Obtained the VS. Call bell within reach.

## 2022-08-26 ENCOUNTER — Other Ambulatory Visit (HOSPITAL_COMMUNITY): Payer: Self-pay

## 2022-08-26 ENCOUNTER — Encounter (HOSPITAL_COMMUNITY): Payer: Self-pay | Admitting: *Deleted

## 2022-08-26 DIAGNOSIS — I10 Essential (primary) hypertension: Secondary | ICD-10-CM | POA: Diagnosis not present

## 2022-08-26 DIAGNOSIS — I5022 Chronic systolic (congestive) heart failure: Secondary | ICD-10-CM | POA: Diagnosis not present

## 2022-08-26 DIAGNOSIS — N186 End stage renal disease: Secondary | ICD-10-CM | POA: Diagnosis not present

## 2022-08-26 DIAGNOSIS — I2510 Atherosclerotic heart disease of native coronary artery without angina pectoris: Secondary | ICD-10-CM | POA: Diagnosis not present

## 2022-08-26 DIAGNOSIS — Z992 Dependence on renal dialysis: Secondary | ICD-10-CM | POA: Diagnosis not present

## 2022-08-26 DIAGNOSIS — R0683 Snoring: Secondary | ICD-10-CM

## 2022-08-26 DIAGNOSIS — I5043 Acute on chronic combined systolic (congestive) and diastolic (congestive) heart failure: Secondary | ICD-10-CM

## 2022-08-26 LAB — RENAL FUNCTION PANEL
Albumin: 2.2 g/dL — ABNORMAL LOW (ref 3.5–5.0)
Anion gap: 13 (ref 5–15)
BUN: 26 mg/dL — ABNORMAL HIGH (ref 6–20)
CO2: 25 mmol/L (ref 22–32)
Calcium: 8.1 mg/dL — ABNORMAL LOW (ref 8.9–10.3)
Chloride: 93 mmol/L — ABNORMAL LOW (ref 98–111)
Creatinine, Ser: 4.62 mg/dL — ABNORMAL HIGH (ref 0.61–1.24)
GFR, Estimated: 14 mL/min — ABNORMAL LOW (ref >60)
Glucose, Bld: 198 mg/dL — ABNORMAL HIGH (ref 70–99)
Phosphorus: 3.1 mg/dL (ref 2.5–4.6)
Potassium: 3.1 mmol/L — ABNORMAL LOW (ref 3.5–5.1)
Sodium: 131 mmol/L — ABNORMAL LOW (ref 135–145)

## 2022-08-26 LAB — CBC
HCT: 36.2 % — ABNORMAL LOW (ref 39.0–52.0)
Hemoglobin: 12 g/dL — ABNORMAL LOW (ref 13.0–17.0)
MCH: 28.9 pg (ref 26.0–34.0)
MCHC: 33.1 g/dL (ref 30.0–36.0)
MCV: 87.2 fL (ref 80.0–100.0)
Platelets: 181 10*3/uL (ref 150–400)
RBC: 4.15 MIL/uL — ABNORMAL LOW (ref 4.22–5.81)
RDW: 15.1 % (ref 11.5–15.5)
WBC: 9.6 10*3/uL (ref 4.0–10.5)
nRBC: 0 % (ref 0.0–0.2)

## 2022-08-26 LAB — GLUCOSE, CAPILLARY
Glucose-Capillary: 143 mg/dL — ABNORMAL HIGH (ref 70–99)
Glucose-Capillary: 269 mg/dL — ABNORMAL HIGH (ref 70–99)
Glucose-Capillary: 146 mg/dL — ABNORMAL HIGH (ref 70–99)

## 2022-08-26 LAB — MAGNESIUM: Magnesium: 1.6 mg/dL — ABNORMAL LOW (ref 1.7–2.4)

## 2022-08-26 MED ORDER — MAGNESIUM SULFATE 2 GM/50ML IV SOLN
2.0000 g | Freq: Once | INTRAVENOUS | Status: AC
  Administered 2022-08-26: 2 g via INTRAVENOUS
  Filled 2022-08-26: qty 50

## 2022-08-26 MED ORDER — APIXABAN 5 MG PO TABS
5.0000 mg | ORAL_TABLET | Freq: Two times a day (BID) | ORAL | 2 refills | Status: DC
Start: 2022-08-26 — End: 2022-09-26
  Filled 2022-08-26: qty 60, 30d supply, fill #0

## 2022-08-26 MED ORDER — POTASSIUM CHLORIDE CRYS ER 20 MEQ PO TBCR
40.0000 meq | EXTENDED_RELEASE_TABLET | Freq: Once | ORAL | Status: AC
  Administered 2022-08-26: 40 meq via ORAL
  Filled 2022-08-26: qty 2

## 2022-08-26 MED ORDER — CARVEDILOL 12.5 MG PO TABS
12.5000 mg | ORAL_TABLET | Freq: Two times a day (BID) | ORAL | 0 refills | Status: DC
  Filled 2022-08-26: qty 180, 90d supply, fill #0

## 2022-08-26 NOTE — Discharge Summary (Signed)
Physician Discharge Summary  Anthony Bullock:[id number] DOB: October 21, 1960 DOA: 08/18/2022  PCP: Kristian Covey, MD  Admit date: 08/18/2022 Discharge date: 08/26/2022  Time spent: 60 minutes  Recommendations for Outpatient Follow-up:  Follow-up with Kristian Covey, MD in 2 weeks.  On follow-up patient need a basic metabolic profile, magnesium level done to follow-up on electrolytes and renal function.  Patient's diabetes will need to be reassessed on follow-up.  Patient will need referral for outpatient sleep study to rule out OSA. Follow-up with Dr. Izora Ribas, cardiology in 2 weeks.   Discharge Diagnoses:  Principal Problem:   ESRD on dialysis [facility]) Active Problems:   Chronic systolic congestive heart failure (HCC)   Pneumonia due to infectious organism   Atrial fibrillation (HCC)   Essential hypertension   Type 2 diabetes mellitus with hyperlipidemia (HCC)   COPD (chronic obstructive pulmonary disease) (HCC)   Adjustment disorder with depressed mood   CAD (coronary artery disease)   S/P bilateral below knee amputation (HCC)   Discharge Condition: Stable and improved.  Diet recommendation: Heart healthy  Filed Weights   08/22/22 1800 08/23/22 1317 08/23/22 1715  Weight: (S) 88.3 kg (S) 88.9 kg (S) 89.2 kg    History of present illness:  HPI per Dr. [name] is a 62 y.o. male with medical history significant of hypertension, hyperlipidemia, diabetes mellitus type 2, insulin-dependent with peripheral diabetic neuropathy, status post bilateral BKA, end-stage renal disease on hemodialysis TTS, chronic congestive heart failure with preserved ejection fraction (EF 50% in 2021),  anxiety &depression who came to outside ED with complaints of shortness of breath since this morning.   Patient stated that this morning he developed acutely shortness of breath, productive cough of clear sputum, chest congestion. He denied having fever or chills.  He denied having  chest pain.  He admitted having dietary indiscretion with meals, not maintaining fluid restriction. When he presented to dialysis unit he was found to be severely dyspneic and hypoxic.  Dialysis was not initiated, last dialysis on 08/16/2022. Per EMS report patient was hypoxic, 70% on room air and was placed on 15 L non-re breather during transport.Anthony Bullock He was brought to ED @ [facility].   ED Course:  Patient presented to outside ED on 08/18/2022. On arrival he was in respiratory distress, placed on nonrebreather mask 15 L/min subsequently ended on BiPAP with FiO2 100% -> 40%. Given suspicion of pneumonia patient was given IV ceftriaxone and azithromycin. Given prior history of CHF, ESRD IV fluids were not administered. During ED stay patient became slightly hypotensive and was placed on low-dose of Levophed, which ultimately was weaned off. ABG showed pH 7.23 with pCO2 49 pO2 49. Chemistry shows sodium 134, potassium 4.3, CO2 24, BUN 55, creatinine 10.5, glucose 196, calcium 8.1, albumin 3.1, total protein 7.6, AST 24, ALT 21, alkaline phosphatase 103. CBC showed WBC 21.9, hemoglobin 14.3, platelet 193. Troponin level was elevated [id number] with delta 428.  Lactic acid 2.1.  NT proBNP 29,111 Patient tested negative for COVID-19, influenza, RSV.  Hospital Course:  #1 acute hypoxemic respiratory failure secondary to volume overload -Patient had presented with volume overload with acute hypoxic respiratory failure. -Patient seen in consultation by nephrology and patient underwent hemodialysis with 1.1 L ultrafiltration removed. -Dyspnea/shortness of breath improved significantly post hemodialysis per patient. -Patient with sats of 93 to 95% on room air. -Repeat chest x-ray done with improvement with infiltrates but continued to have features of pulmonary edema. -2D echo done with  EF of 30 to 35%, left ventricular global hypokinesis, moderate concentric LVH, grade 2 diastolic dysfunction, mildly  dilated left atrial size, moderate MVR. -Due to abnormal 2D echo, new onset A-fib cardiology consulted for further evaluation and management. -See # 3,4,.5 as below. -Patient seen by nephrology and underwent hemodialysis throughout the hospitalization. -Outpatient follow-up at hemodialysis center as scheduled on hemodialysis day on 08/27/2022.   2.  End-stage renal disease on HD -Patient noted to have gone into A-fib with RVR during routine hemodialysis on 08/23/2022. -Patient noted to have received IV metoprolol 500 cc normal saline bolus. -Patient currently noted to have received an extra HD due to volume overload on 08/22/2022. -Patient maintained on hemodialysis during the hospitalization and will follow-up in the outpatient setting on his usual hemodialysis day on 08/27/2022.   -Outpatient follow-up with nephrology.    3.  New onset A-fib CHA2DS2VASC =4 -Patient noted on monitor A-fib while on hemodialysis on, 08/23/2022. -Patient noted to have received a 500 cc bolus, and received IV metoprolol 5 mg x 1 for rate control. -Patient subsequently had to be placed on a Cardizem drip for better rate control, however overnight patient noted to have soft/low blood pressure and Cardizem drip discontinued and patient received a bolus of IV fluids. -Patient subsequently converted and remained in normal sinus rhythm and Cardizem drip subsequently discontinued.  -Patient maintained on carvedilol. -Patient with 2D echo done with EF of 30 to 35%, left ventricular global hypokinesis, moderate concentric LVH, grade 2 diastolic dysfunction, mildly dilated left atrial size, moderate MVR. -Due to abnormal 2D echo, new onset A-fib cardiology consulted for further evaluation and management. -Patient with no further hemoptysis and has been started on a heparin drip per cardiology. -Due to abnormal 2D echo patient underwent Lexiscan Myoview stress test on 08/25/2022 which showed a prior infarct consistent with prior  left circumflex infarct but no ischemia noted.  -Aspirin subsequently discontinued, heparin discontinued and patient transition to Eliquis per cardiology recommendations.   -Outpatient follow-up with cardiology.    4.  CAD/elevated troponin/abnormal 2D echo -Patient denied any ongoing chest pain however presented with shortness of breath, noted to be volume overloaded on exam and received hemodialysis with some improvement with pulmonary edema. -2D echo with a EF of 30 to 35%, previously was 50% in 2021, left ventricular global hypokinesis. -Cardiology consulted for further evaluation and management and patient underwent Myoview stress test on 08/25/2022 which showed a prior infarct consistent with prior left circumflex infarct but no ischemia noted.  -Patient initially was on heparin which was subsequently discontinued and patient placed on Eliquis.  Patient's aspirin was discontinued per cardiology recommendations. -Patient maintained on carvedilol during the hospitalization. -Outpatient follow-up with cardiology.   5.  Acute on chronic systolic heart failure -See problem #1. -2D echo done with EF of 30 to 35%, left ventricular global hypokinesis, moderate concentric LVH, grade 2 diastolic dysfunction, mildly dilated left atrial size, moderate MVR. -Due to abnormal 2D echo, new onset A-fib cardiology consulted for further evaluation and management. -Patient underwent Lexiscan Myoview stress test on 08/25/2022 which showed a prior infarct consistent with prior left circumflex infarct but no ischemia noted.   -Patient was followed by cardiology maintained on carvedilol however due to low blood pressure patient unable to be placed on other GDMT.   -Patient underwent hemodialysis during the hospitalization for volume management.   -Outpatient follow-up with cardiology.    6.  Pneumonia ruled out   7.  Hypertension -Patient maintained on carvedilol. -  Due to soft low blood pressure issues some of  patient's cardiac medications have been discontinued in the outpatient setting. -Patient receiving midodrine on HD days.   8.  Type 2 diabetes mellitus -Hemoglobin A1c 8.7 (06/24/2022) -Patient maintained on Semglee 12 units daily as well as SSI during the hospitalization. -Patient will be resumed on home regimen of diabetic medications on discharge.   9.  COPD -Stable. -Patient maintained on bronchodilator therapy and inhaled corticosteroids.   -Patient also placed on flutter valve and incentive spirometry.     10.  Adjustment disorder with depressed mood -Patient maintained on home regimen nortriptyline.   11.  PAD status post bilateral BKA -Patient with prosthesis for ambulation. -Patient initially was on aspirin and heparin and subsequently aspirin discontinued and patient transition to Eliquis.  Patient maintained on home regimen of fenofibrate and atorvastatin.  12.  Snoring -Patient noted to have some snoring and overnight hypoxia. -Ambulatory sats noted to be > 90%. -Will need outpatient sleep study.      Procedures: Chest x-ray 08/19/2022, 08/20/2022 2D echo 08/22/2022 Myoview stress test 08/25/2022  Consultations: Nephrology: Dr. Allena Katz 08/18/2022 Cardiology: Dr. Duke Salvia 08/24/2022  Discharge Exam: Vitals:   08/26/22 1246 08/26/22 1542  BP:  93/60  Pulse: 80 78  Resp: 18 16  Temp:  97.9 F (36.6 C)  SpO2: 98% 93%    General: NAD. Cardiovascular: Regular rate rhythm no murmurs rubs or gallops.  No JVD.   Respiratory: Clear to auscultation bilaterally.  No wheezes, no crackles, no rhonchi.  Fair air movement.  Speaking in full sentences.  Discharge Instructions   Discharge Instructions     Diet - low sodium heart healthy   Complete by: As directed    Increase activity slowly   Complete by: As directed       Allergies as of 08/26/2022       Reactions   Epoetin Alfa Other (See Comments)   unknown   Ferumoxytol Other (See Comments)   unknown   Morphine  Sulfate Rash, Other (See Comments)   Itches all over, red spots        Medication List     STOP taking these medications    aspirin EC 81 MG tablet   furosemide 40 MG tablet Commonly known as: LASIX       TAKE these medications    acetaminophen 325 MG tablet Commonly known as: TYLENOL Take 1-2 tablets (325-650 mg total) by mouth every 4 (four) hours as needed for mild pain.   atorvastatin 80 MG tablet Commonly known as: LIPITOR TAKE 1 TABLET BY MOUTH AT BEDTIME   azelastine 0.1 % nasal spray Commonly known as: Astelin Place 2 sprays into both nostrils 3 (three) times daily as needed for rhinitis. Use in each nostril as directed   BD Pen Needle Nano U/F 32G X 4 MM Misc Generic drug: Insulin Pen Needle USE 1 PEN NEEDLE SUBCUTANEOUSLY WITH INSULIN 4 TIMES DAILY   CALCITRIOL PO Take by mouth 3 (three) times a week. ONLY PROVIDED AT DIALYSIS Notes to patient: Take as you were on dialysis days   carvedilol 12.5 MG tablet Commonly known as: COREG Take 1 tablet (12.5 mg total) by mouth 2 (two) times daily.   cinacalcet 30 MG tablet Commonly known as: SENSIPAR Take 1 tablet (30 mg total) by mouth Every Tuesday,Thursday,and Saturday with dialysis. Notes to patient: Take as you were prior to admission on Dialysis days   Eliquis 5 MG Tabs tablet Generic drug: apixaban Take  1 tablet (5 mg total) by mouth 2 (two) times daily.   erythromycin ophthalmic ointment Place 1 Application into the right eye at bedtime.   fenofibrate 160 MG tablet TAKE 1 TABLET BY MOUTH ONCE DAILY   fexofenadine 180 MG tablet Commonly known as: ALLEGRA Take 180 mg by mouth daily as needed for allergies.   FreeStyle Libre 14 Day Sensor Misc USE AS DIRECTED EVERY 14 DAYS   FreeStyle Libre 3 Sensor Misc Place 1 sensor on the skin every 14 days. Use to check glucose continuously   FreeStyle Libre 3 Reader Devi Use to check blood glucose TID   gabapentin 300 MG capsule Commonly known as:  NEURONTIN TAKE 2 CAPSULES BY MOUTH TWICE DAILY .   glucose blood test strip Check 1 time daily. E11.9 One Touch Ultra Blue Test Strips   HumaLOG KwikPen 200 UNIT/ML KwikPen Generic drug: insulin lispro INJECT A MAXIMUM OF 28 UNITS SUBCUTANEOUSLY TWICE DAILY WITH LUNCH AND SUPPER PER SLIDING SCALE. APPOINTMENT REQUIRED FOR FUTURE REFILLS   lanthanum 500 MG chewable tablet Commonly known as: FOSRENOL Chew 500-1,000 mg by mouth See admin instructions. Take 1000 mg with meals three time a day and 500 mg with snacks   Lantus SoloStar 100 UNIT/ML Solostar Pen Generic drug: insulin glargine INJECT 12 UNITS SUBCUTANEOUSLY AT BEDTIME   midodrine 10 MG tablet Commonly known as: PROAMATINE Take 10 mg by mouth 3 (three) times a week. Notes to patient: Take as you were prior to admission    multivitamin Tabs tablet Take 1 tablet by mouth once daily   nortriptyline 25 MG capsule Commonly known as: PAMELOR Take 1 capsule (25 mg total) by mouth at bedtime.   Olopatadine HCl 0.2 % Soln Place 1 drop into both eyes daily as needed (for allergies).   tobramycin 0.3 % ophthalmic solution Commonly known as: TOBREX Place 1 drop into the right eye every 6 (six) hours.   Ventolin HFA 108 (90 Base) MCG/ACT inhaler Generic drug: albuterol Inhale 1-2 puffs into the lungs every 6 (six) hours as needed for wheezing or shortness of breath.       Allergies  Allergen Reactions   Epoetin Alfa Other (See Comments)    unknown   Ferumoxytol Other (See Comments)    unknown   Morphine Sulfate Rash and Other (See Comments)    Itches all over, red spots    Follow-up Information     Burchette, Elberta Fortis, MD. Schedule an appointment as soon as possible for a visit in 2 week(s).   Specialty: Family Medicine Contact information: [address] [place] [address] [phone]         Hemodialysis center as scheduled Follow up on 08/27/2022.          Christell Constant, MD.  Schedule an appointment as soon as possible for a visit in 2 week(s).   Specialty: Cardiology Contact information: [address] [place] [address] [phone]                  The results of significant diagnostics from this hospitalization (including imaging, microbiology, ancillary and laboratory) are listed below for reference.    Significant Diagnostic Studies: NM Myocar Multi W/Spect W/Wall Motion / EF  Result Date: 08/25/2022 CLINICAL DATA:  Heart failure. EXAM: MYOCARDIAL IMAGING WITH SPECT (REST AND PHARMACOLOGIC-STRESS) GATED LEFT VENTRICULAR WALL MOTION STUDY LEFT VENTRICULAR EJECTION FRACTION TECHNIQUE: Standard myocardial SPECT imaging was performed after resting intravenous injection of 10.7 mCi Tc-5m tetrofosmin. Subsequently,  intravenous infusion of Lexiscan was performed under the supervision of the Cardiology staff. At peak effect of the drug, 32.3 mCi Tc-2m tetrofosmin was injected intravenously and standard myocardial SPECT imaging was performed. Quantitative gated imaging was also performed to evaluate left ventricular wall motion, and estimate left ventricular ejection fraction. COMPARISON:  None Available. FINDINGS: Perfusion: There is a large, fixed defect involving the basal inferolateral, mid inferolateral, mid anterolateral and apicolateral segments. Imaging findings are concerning for infarction involving the left circumflex coronary artery. Wall Motion: There is markedly diminished global left ventricular wall motion with left ventricular dilatation Left Ventricular Ejection Fraction: 18 % End diastolic volume 175 ml End systolic volume 144 ml IMPRESSION: 1. There is a large in size, severe, fixed defect involving the basal inferolateral, mid inferolateral, mid anterolateral and apical lateral segments. This is consistent with infarction involving the left circumflex coronary artery. 2. Globally diminished left ventricular wall motion and left  ventricular dilatation. 3. Left ventricular ejection fraction 18% 4. Non invasive risk stratification*: High *2012 Appropriate Use Criteria for Coronary Revascularization Focused Update: J Am Coll Cardiol. 2012;59(9):857-881. http://content.dementiazones.com.aspx?articleid=[email or web address] Electronically Signed   By: Signa Kell M.D.   On: 08/25/2022 14:28   DG CHEST PORT 1 VIEW  Result Date: 08/25/2022 CLINICAL DATA:  In stage kidney disease with volume access. EXAM: PORTABLE CHEST 1 VIEW COMPARISON:  07/19/2022 FINDINGS: Unchanged position of left-sided ICD lead. There is a right chest wall dialysis catheter with tips in the superior cavoatrial junction and right atrium. Heart size is normal. Pulmonary vascular congestion is noted. No frank interstitial edema, pleural effusions or airspace disease. IMPRESSION: Pulmonary vascular congestion without frank interstitial edema. Electronically Signed   By: Signa Kell M.D.   On: 08/25/2022 10:28   ECHOCARDIOGRAM COMPLETE  Result Date: 08/22/2022    ECHOCARDIOGRAM REPORT   Patient Name:   Anthony Bullock [facility] Date of Exam: 08/22/2022 Medical Rec #:  [id number]     Height:       71.0 in Accession #:    [id number]    Weight:       205.5 lb Date of Birth:  May 04, 1960     BSA:          2.133 m Patient Age:    61 years      BP:           115/64 mmHg Patient Gender: M             HR:           84 bpm. Exam Location:  Inpatient Procedure: 2D Echo, Color Doppler, Cardiac Doppler and Intracardiac            Opacification Agent Indications:    Dyspnea  History:        Patient has prior history of Echocardiogram examinations, most                 recent 09/03/2019. Cardiomyopathy and CHF, CAD, COPD; Risk                 Factors:Former Smoker, Diabetes, Dyslipidemia and Hypertension.                 ESRD. History of Cardiac Catheterization.  Sonographer:    Raeford Razor RDCS Referring Phys: [id number] MAURICIO Elsa Ploch ARRIEN IMPRESSIONS  1. Left ventricular ejection fraction, by  estimation, is 30 to 35%. The left ventricle has moderately decreased function. The left ventricle demonstrates global hypokinesis. There is moderate concentric left ventricular hypertrophy. Left ventricular diastolic  parameters are consistent with Grade II diastolic dysfunction (pseudonormalization). Elevated left ventricular end-diastolic pressure.  2. Right ventricular systolic function is normal. The right ventricular size is normal.  3. Left atrial size was mildly dilated.  4. The mitral valve is normal in structure. Moderate mitral valve regurgitation. No evidence of mitral stenosis.  5. The aortic valve is normal in structure. Aortic valve regurgitation is not visualized. Aortic valve sclerosis is present, with no evidence of aortic valve stenosis.  6. The inferior vena cava is normal in size with greater than 50% respiratory variability, suggesting right atrial pressure of 3 mmHg. FINDINGS  Left Ventricle: Left ventricular ejection fraction, by estimation, is 30 to 35%. The left ventricle has moderately decreased function. The left ventricle demonstrates global hypokinesis. Definity contrast agent was given IV to delineate the left ventricular endocardial borders. The left ventricular internal cavity size was normal in size. There is moderate concentric left ventricular hypertrophy. Left ventricular diastolic parameters are consistent with Grade II diastolic dysfunction (pseudonormalization). Elevated left ventricular end-diastolic pressure. Right Ventricle: The right ventricular size is normal. No increase in right ventricular wall thickness. Right ventricular systolic function is normal. Left Atrium: Left atrial size was mildly dilated. Right Atrium: Right atrial size was normal in size. Pericardium: There is no evidence of pericardial effusion. Presence of epicardial fat layer. Mitral Valve: The mitral valve is normal in structure. Moderate mitral valve regurgitation. No evidence of mitral valve stenosis.  Tricuspid Valve: The tricuspid valve is normal in structure. Tricuspid valve regurgitation is not demonstrated. No evidence of tricuspid stenosis. Aortic Valve: The aortic valve is normal in structure. Aortic valve regurgitation is not visualized. Aortic valve sclerosis is present, with no evidence of aortic valve stenosis. Aortic valve peak gradient measures 2.4 mmHg. Pulmonic Valve: The pulmonic valve was not well visualized. Pulmonic valve regurgitation is trivial. No evidence of pulmonic stenosis. Aorta: The aortic root is normal in size and structure. Venous: The inferior vena cava is normal in size with greater than 50% respiratory variability, suggesting right atrial pressure of 3 mmHg. IAS/Shunts: No atrial level shunt detected by color flow Doppler.  LEFT VENTRICLE PLAX 2D LVIDd:         6.60 cm      Diastology LVIDs:         5.40 cm      LV e' medial:    5.66 cm/s LV PW:         1.40 cm      LV E/e' medial:  20.0 LV IVS:        1.40 cm      LV e' lateral:   6.53 cm/s LVOT diam:     2.10 cm      LV E/e' lateral: 17.3 LV SV:         48 LV SV Index:   22 LVOT Area:     3.46 cm  LV Volumes (MOD) LV vol d, MOD A2C: 110.0 ml LV vol d, MOD A4C: 195.0 ml LV vol s, MOD A2C: 71.9 ml LV vol s, MOD A4C: 134.0 ml LV SV MOD A2C:     38.1 ml LV SV MOD A4C:     195.0 ml LV SV MOD BP:      48.3 ml RIGHT VENTRICLE            IVC RV Basal diam:  2.70 cm    IVC diam: 2.00 cm RV S prime:     6.96 cm/s TAPSE (M-mode): 2.9 cm  LEFT ATRIUM             Index        RIGHT ATRIUM           Index LA diam:        3.70 cm 1.73 cm/m   RA Area:     12.50 cm LA Vol (A2C):   82.8 ml 38.82 ml/m  RA Volume:   25.70 ml  12.05 ml/m LA Vol (A4C):   62.4 ml 29.26 ml/m LA Biplane Vol: 73.0 ml 34.23 ml/m  AORTIC VALVE AV Area (Vmax): 3.13 cm AV Vmax:        77.00 cm/s AV Peak Grad:   2.4 mmHg LVOT Vmax:      69.60 cm/s LVOT Vmean:     47.700 cm/s LVOT VTI:       0.138 m  AORTA Ao Root diam: 2.70 cm Ao Asc diam:  3.30 cm MITRAL VALVE MV Area  (PHT): 6.37 cm     SHUNTS MV Decel Time: 119 msec     Systemic VTI:  0.14 m MR PISA:        1.57 cm    Systemic Diam: 2.10 cm MR PISA Radius: 0.50 cm MV E velocity: 113.00 cm/s MV A velocity: 72.30 cm/s MV E/A ratio:  1.56 Kardie Tobb DO Electronically signed by Thomasene Ripple DO Signature Date/Time: 08/22/2022/3:51:04 PM    Final    DG Chest 1 View  Result Date: 08/20/2022 CLINICAL DATA:  Dyspnea. EXAM: CHEST  1 VIEW COMPARISON:  Chest radiograph dated 08/19/2022. FINDINGS: Right-sided dialysis catheter and left AICD device in similar position. There is mild cardiomegaly with vascular congestion and edema relatively similar to prior radiograph. Trace bilateral pleural effusions may be present. No pneumothorax. Atherosclerotic calcification of the aorta. No acute osseous pathology. IMPRESSION: Overall no significant interval change. Electronically Signed   By: Elgie Collard M.D.   On: 08/20/2022 18:18   DG Chest 1 View  Result Date: 08/19/2022 CLINICAL DATA:  Dyspnea EXAM: CHEST  1 VIEW COMPARISON:  X-ray 07/18/2022 and older FINDINGS: Stable double lumen right IJ catheter with tip along the SVC right atrial junction region. Left-sided defibrillator. Overlapping cardiac leads. Stable cardiopericardial silhouette with central vascular congestion and interstitial changes. No pneumothorax or large effusion. Overlapping cardiac leads. IMPRESSION: No significant interval change when adjusting for technique. Electronically Signed   By: Karen Kays M.D.   On: 08/19/2022 17:10   CT ANGIO HEAD NECK W WO CM  Result Date: 08/05/2022 CLINICAL DATA:  Dipl[place] EXAM: CT ANGIOGRAPHY HEAD AND NECK WITH AND WITHOUT CONTRAST TECHNIQUE: Multidetector CT imaging of the head and neck was performed using the standard protocol during bolus administration of intravenous contrast. Multiplanar CT image reconstructions and MIPs were obtained to evaluate the vascular anatomy. Carotid stenosis measurements (when applicable) are  obtained utilizing NASCET criteria, using the distal internal carotid diameter as the denominator. RADIATION DOSE REDUCTION: This exam was performed according to the departmental dose-optimization program which includes automated exposure control, adjustment of the mA and/or kV according to patient size and/or use of iterative reconstruction technique. CONTRAST:  50mL OMNIPAQUE IOHEXOL 350 MG/ML SOLN COMPARISON:  None Available. FINDINGS: CT HEAD FINDINGS Brain: There is no mass, hemorrhage or extra-axial collection. The size and configuration of the ventricles and extra-axial CSF spaces are normal. There is no acute or chronic infarction. There is hypoattenuation of the periventricular white matter, most commonly indicating chronic ischemic microangiopathy. Skull: The visualized skull base, calvarium and extracranial soft  tissues are normal. Sinuses/Orbits: No fluid levels or advanced mucosal thickening of the visualized paranasal sinuses. No mastoid or middle ear effusion. The orbits are normal. CTA NECK FINDINGS SKELETON: There is no bony spinal canal stenosis. No lytic or blastic lesion. OTHER NECK: Normal pharynx, larynx and major salivary glands. No cervical lymphadenopathy. Unremarkable thyroid gland. UPPER CHEST: No pneumothorax or pleural effusion. No nodules or masses. AORTIC ARCH: There is calcific atherosclerosis of the aortic arch. Conventional 3 vessel aortic branching pattern. RIGHT CAROTID SYSTEM: No dissection, occlusion or aneurysm. Mild atherosclerotic calcification at the carotid bifurcation without hemodynamically significant stenosis. LEFT CAROTID SYSTEM: There is moderate mixed density atherosclerosis within the distal common carotid artery and at the carotid bifurcation. No hemodynamically significant stenosis of the internal carotid artery, but there is narrowing of the common carotid artery to a diameter of approximately 2 mm. VERTEBRAL ARTERIES: Right dominant configuration.There narrowing  of the left vertebral artery origin due to calcific atherosclerosis. Otherwise, there is no dissection, occlusion or flow-limiting stenosis to the skull base (V1-V3 segments). CTA HEAD FINDINGS POSTERIOR CIRCULATION: --Vertebral arteries: Calcific atherosclerosis without high-grade stenosis. --Inferior cerebellar arteries: Normal. --Basilar artery: Normal. --Superior cerebellar arteries: Normal. --Posterior cerebral arteries (PCA): Normal. ANTERIOR CIRCULATION: --Intracranial internal carotid arteries: Calcific atherosclerosis with severe stenosis of both cavernous segments. --Anterior cerebral arteries (ACA): Normal. Both A1 segments are present. Patent anterior communicating artery (a-comm). --Middle cerebral arteries (MCA): Normal. VENOUS SINUSES: As permitted by contrast timing, patent. ANATOMIC VARIANTS: Both P comms are patent Review of the MIP images confirms the above findings. IMPRESSION: 1. No emergent large vessel occlusion. 2. Severe stenosis of both cavernous internal carotid arteries secondary to calcific atherosclerosis. 3. Moderate mixed density atherosclerosis within the distal left common carotid artery and at the carotid bifurcation without hemodynamically significant stenosis of the internal carotid arteries. 4. Moderate narrowing of the left vertebral artery origin due to calcific atherosclerosis. Aortic Atherosclerosis (ICD10-I70.0). Electronically Signed   By: Deatra Robinson M.D.   On: 08/05/2022 19:21   CT Head Wo Contrast  Result Date: 08/04/2022 CLINICAL DATA:  Headache and fever with double vision. EXAM: CT HEAD WITHOUT CONTRAST TECHNIQUE: Contiguous axial images were obtained from the base of the skull through the vertex without intravenous contrast. RADIATION DOSE REDUCTION: This exam was performed according to the departmental dose-optimization program which includes automated exposure control, adjustment of the mA and/or kV according to patient size and/or use of iterative  reconstruction technique. COMPARISON:  No prior brain imaging. Report from prior facial CT 12/08/2011 is reviewed but the films are unavailable. FINDINGS: Brain: There is mild global atrophy and mild-to-moderate small-vessel disease of the cerebral white matter. There is mild atrophic ventriculomegaly without midline shift. There is a partially empty sella. Dystrophic calcifications are scattered along the falx. No asymmetry is seen concerning for a cortical based acute infarct, hemorrhage, mass or mass effect. Basal cisterns are clear. Vascular: The carotid siphons and distal vertebral arteries are heavily calcified. No hyperdense central vessel is seen. Skull: Negative for fractures or focal lesions. Scattered vascular calcifications in the scalp. Sinuses/Orbits: There is mild membrane disease in the ethmoid air cells and maxillary sinuses. No fluid level. Small retention cyst or polyp in the right locule of the sphenoid sinus. Other visualized sinuses are clear. There is trace fluid in the mastoid tips. Rest of the mastoids are clear. There have been prior lens extractions. An ossific body along side the right inferior nasal turbinate in the right nasal passage is again noted measuring  10.4 x 10.5 mm. 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 This was described previously in 2013 as well. Other: None. IMPRESSION: 1. No acute intracranial CT findings. Atrophy and small-vessel disease. 2. Sinus membrane disease. 3. Trace fluid in the mastoid tips. 4. Partially empty sella. 5. 10.4 x 10.5 mm ossific body along side the right inferior nasal turbinate in the right nasal passage. 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 This was described previously in 2013 as well. Electronically Signed   By: Almira Bar M.D.   On: 08/04/2022 20:11    Microbiology: Recent Results (from the past 240 hour(s))  Culture, blood (Routine  X 2) w Reflex to ID Panel     Status: None   Collection Time: 08/18/22  9:25 PM   Specimen: BLOOD  Result Value Ref Range Status   Specimen Description BLOOD SITE NOT SPECIFIED  Final   Special Requests   Final    BOTTLES DRAWN AEROBIC AND ANAEROBIC Blood Culture adequate volume   Culture   Final    NO GROWTH 5 DAYS Performed at [facility], [address], [place], [place] [address]    Report Status 08/23/2022 FINAL  Final  Culture, blood (Routine X 2) w Reflex to ID Panel     Status: None   Collection Time: 08/18/22  9:30 PM   Specimen: BLOOD  Result Value Ref Range Status   Specimen Description BLOOD SITE NOT SPECIFIED  Final   Special Requests   Final    BOTTLES DRAWN AEROBIC AND ANAEROBIC Blood Culture adequate volume   Culture   Final    NO GROWTH 5 DAYS Performed at [facility], [address] [place], [place] [address]    Report Status 08/23/2022 FINAL  Final  Respiratory (~20 pathogens) panel by PCR     Status: None   Collection Time: 08/19/22 11:52 AM   Specimen: Nasopharyngeal Swab; Respiratory  Result Value Ref Range Status   Adenovirus NOT DETECTED NOT DETECTED Final   Coronavirus 229E NOT DETECTED NOT DETECTED Final    Comment: (NOTE) The Coronavirus on the Respiratory Panel, DOES NOT test for the novel  Coronavirus (2019 nCoV)    Coronavirus HKU1 NOT DETECTED NOT DETECTED Final   Coronavirus NL63 NOT DETECTED NOT DETECTED Final   Coronavirus OC43 NOT DETECTED NOT DETECTED Final   Metapneumovirus NOT DETECTED NOT DETECTED Final   Rhinovirus / Enterovirus NOT DETECTED NOT DETECTED Final   Influenza A NOT DETECTED NOT DETECTED Final   Influenza B NOT DETECTED NOT DETECTED Final   Parainfluenza Virus 1 NOT DETECTED NOT DETECTED Final   Parainfluenza Virus 2 NOT DETECTED NOT DETECTED Final   Parainfluenza Virus 3 NOT DETECTED NOT DETECTED Final   Parainfluenza Virus 4 NOT DETECTED NOT DETECTED Final   Respiratory Syncytial Virus NOT DETECTED NOT  DETECTED Final   Bordetella pertussis NOT DETECTED NOT DETECTED Final   Bordetella Parapertussis NOT DETECTED NOT DETECTED Final   Chlamydophila pneumoniae NOT DETECTED NOT DETECTED Final   Mycoplasma pneumoniae NOT DETECTED NOT DETECTED Final    Comment: Performed at [facility], [address] [place], [place] [address]  SARS Coronavirus 2 by RT PCR (hospital order, performed in [facility] hospital lab) *cepheid single result test* Anterior Nasal Swab     Status: None   Collection Time: 08/19/22  1:50 PM   Specimen:  Anterior Nasal Swab  Result Value Ref Range Status   SARS Coronavirus 2 by RT PCR NEGATIVE NEGATIVE Final    Comment: Performed at [facility], [address], [place], [place] [address]     Labs: Basic Metabolic Panel: Recent Labs  Lab 08/22/22 0209 08/23/22 0130 08/24/22 0115 08/25/22 0150 08/26/22 0048 08/26/22 0049  NA 130* 132* 132* 132*  --  131*  K 4.0 3.6 3.9 3.7  --  3.1*  CL 91* 93* 94* 94*  --  93*  CO2 23 25 23 23   --  25  GLUCOSE 226* 180* 218* 214*  --  198*  BUN 54* 34* 28* 52*  --  26*  CREATININE 8.79* 6.63* 5.02* 7.51*  --  4.62*  CALCIUM 8.2* 8.4* 8.5* 8.6*  --  8.1*  MG  --   --   --   --  1.6*  --   PHOS 5.6* 3.6 4.1 4.5  --  3.1   Liver Function Tests: Recent Labs  Lab 08/22/22 0209 08/23/22 0130 08/24/22 0115 08/25/22 0150 08/26/22 0049  ALBUMIN 2.3* 2.3* 2.3* 2.4* 2.2*   No results for input(s): "LIPASE", "AMYLASE" in the last 168 hours. No results for input(s): "AMMONIA" in the last 168 hours. CBC: Recent Labs  Lab 08/20/22 0436 08/21/22 0409 08/22/22 0230 08/25/22 0150 08/26/22 0049  WBC 12.7* 11.5* 11.0* 8.9 9.6  NEUTROABS 9.8*  --  8.6*  --   --   HGB 11.3* 11.0* 10.6* 11.9* 12.0*  HCT 34.5* 34.1* 32.9* 36.2* 36.2*  MCV 92.5 93.2 90.6 91.2 87.2  PLT 114* 119* 141* 160 181   Cardiac Enzymes: No results for input(s): "CKTOTAL", "CKMB", "CKMBINDEX", "TROPONINI" in the last 168 hours. BNP: BNP (last  3 results) Recent Labs    08/18/22 2125  BNP 1,158.6*    ProBNP (last 3 results) No results for input(s): "PROBNP" in the last 8760 hours.  CBG: Recent Labs  Lab 08/25/22 1306 08/25/22 2049 08/26/22 0622 08/26/22 1145 08/26/22 1545  GLUCAP 176* 123* 143* 269* 146*       Signed:  Ramiro Harvest MD.  [facility] 08/26/2022, 5:26 PM

## 2022-08-26 NOTE — Progress Notes (Addendum)
Contacted attending to inquire about possible d/c date so update can be provided to out-pt HD clinic. Will assist as needed.   Olivia Canter Renal Navigator [phone]  Addendum at 3:11 pm: Attending MD plans to d/c pt today. Contacted [facility] to advise clinic of MD's plan to d/c pt today and that pt should resume care tomorrow. Will fax today's renal note to clinic today and will fax D/C summary once available for continuation of care.

## 2022-08-26 NOTE — Progress Notes (Addendum)
Patient Name: Anthony Bullock Date of Encounter: 08/26/2022 [facility] Cardiologist: Dina Rich, MD   Interval Summary  .    Feeling well.  Eager to go home.   Vital Signs .    Vitals:   08/25/22 2338 08/26/22 0336 08/26/22 0810 08/26/22 1138  BP: 120/75 (!) 117/58 (!) 113/55 (!) 102/57  Pulse: 96 93 89 80  Resp: 17 14 20 17   Temp: 98.3 F (36.8 C) 97.8 F (36.6 C) 97.7 F (36.5 C) 97.8 F (36.6 C)  TempSrc: Oral Oral Oral Oral  SpO2: 93% 96% 98% 97%  Weight:      Height:        Intake/Output Summary (Last 24 hours) at 08/26/2022 1154 Last data filed at 08/26/2022 1000 Gross per 24 hour  Intake 480 ml  Output 300 ml  Net 180 ml      08/23/2022    5:15 PM 08/23/2022    1:17 PM 08/22/2022    6:00 PM  Last 3 Weights  Weight (lbs) 196 lb 10.4 oz  195 lb 15.8 oz  194 lb 10.7 oz   Weight (kg) 89.2 kg  88.9 kg  88.3 kg      Significant value      Telemetry/ECG    Sinus rhythm.  - Personally Reviewed  Physical Exam .    VS:  BP (!) 102/57 (BP Location: Left Wrist)   Pulse 80   Temp 97.8 F (36.6 C) (Oral)   Resp 17   Ht 5\' 11"  (1.803 m)   Wt (S) 89.2 kg Comment: Bed Scale  SpO2 97%   BMI 27.43 kg/m  , BMI Body mass index is 27.43 kg/m. GENERAL:  Well appearing HEENT: Pupils equal round and reactive, fundi not visualized, oral mucosa unremarkable NECK:  No jugular venous distention, waveform within normal limits, carotid upstroke brisk and symmetric, no bruits, no thyromegaly LUNGS:  Clear to auscultation bilaterally HEART:  RRR.  PMI not displaced or sustained,S1 and S2 within normal limits, no S3, no S4, no clicks, no rubs, no murmurs ABD:  Flat, positive bowel sounds normal in frequency in pitch, no bruits, no rebound, no guarding, no midline pulsatile mass, no hepatomegaly, no splenomegaly EXT: Bilateral BKA SKIN:  No rashes no nodules NEURO:  Cranial nerves II through XII grossly intact, motor grossly intact throughout PSYCH:   Cognitively intact, oriented to person place and time   Assessment & Plan .     Mr. Koel is a 35M with non-obstructive CAD, PAD with bilateral BKA pain, HFrEF with subsequent normalization of LVEF, status post ICD, hypertension, hyperlipidemia, diabetes, ESRD on HD admitted with hypoxic respiratory failure due to volume overload.  Cardiology consulted due to new onset atrial fibrillation with RVR during HD.   # New onset atrial fibrillation: Patient had several hours of atrial fibrillation.  He reports feeling weak but no distinct sensation of palpitation.  He subsequently converted back to sinus rhythm.  He did not tolerate IV diltiazem due to hypotension.  He does require midodrine for blood pressure augmentation at baseline.  Echo also revealed that although his LVEF had recovered, it is now back down to 30-35%. Lexiscan Myoview this admission with prior infarct but no ischemia.  TSH wnl.   Continue carvedilol and Eliquis.   # Acute on chronic systolic and diastolic heart failure: LVEF reduced to 30-35% as above. Lexiscan Myoview consistent with prior LCX infarct but no ischemia.  Continue carvedilol.  Blood pressure has been too  low for other GDMT.   # Nonobstructive CAD: # PAD status post BKA: Lexiscan Myoview with infarct but no ischemia as above.  Stop aspirin and start Eliquis.  Continue atorvastatin.  Continue fenofibrate.  # Snoring:  Snoring and overnight hypoxia.  Recommend outpatient sleep study.    [facility] will sign off.   Medication Recommendations:  none Other recommendations (labs, testing, etc):  none Follow up as an outpatient:  we will arrange  For questions or updates, please contact [facility] Please consult [email or web address] for contact info under        Signed, Chilton Si, MD

## 2022-08-26 NOTE — Progress Notes (Signed)
[facility] Progress Note   Subjective:   Seen in room - SR on monitor. Did have some SOB overnight, back on O2 this am.  Had dialysis yesterday, minimal UF documented. K 3.1    Objective Vitals:   08/25/22 2053 08/25/22 2338 08/26/22 0336 08/26/22 0810  BP: (!) 148/55 120/75 (!) 117/58 (!) 113/55  Pulse: 95 96 93 89  Resp: 19 17 14 20   Temp: 97.9 F (36.6 C) 98.3 F (36.8 C) 97.8 F (36.6 C) 97.7 F (36.5 C)  TempSrc: Oral Oral Oral Oral  SpO2: 93% 93% 96% 98%  Weight:      Height:       Physical Exam General: Alert male in NAD Heart: RRR, no murmurs, rubs or gallops Lungs: Bilateral breath sounds Abdomen: Soft, non-distended  Extremities: B/l BKA, no BL stump edema appreciated Dialysis Access:  [facility]  Filed Weights   08/22/22 1800 08/23/22 1317 08/23/22 1715  Weight: (S) 88.3 kg (S) 88.9 kg (S) 89.2 kg    Intake/Output Summary (Last 24 hours) at 08/26/2022 0944 Last data filed at 08/25/2022 1945 Gross per 24 hour  Intake 240 ml  Output 300 ml  Net -60 ml    Additional Objective Labs: Basic Metabolic Panel: Recent Labs  Lab 08/24/22 0115 08/25/22 0150 08/26/22 0049  NA 132* 132* 131*  K 3.9 3.7 3.1*  CL 94* 94* 93*  CO2 23 23 25   GLUCOSE 218* 214* 198*  BUN 28* 52* 26*  CREATININE 5.02* 7.51* 4.62*  CALCIUM 8.5* 8.6* 8.1*  PHOS 4.1 4.5 3.1   Liver Function Tests: Recent Labs  Lab 08/24/22 0115 08/25/22 0150 08/26/22 0049  ALBUMIN 2.3* 2.4* 2.2*   No results for input(s): "LIPASE", "AMYLASE" in the last 168 hours. CBC: Recent Labs  Lab 08/20/22 0436 08/21/22 0409 08/22/22 0230 08/25/22 0150 08/26/22 0049  WBC 12.7* 11.5* 11.0* 8.9 9.6  NEUTROABS 9.8*  --  8.6*  --   --   HGB 11.3* 11.0* 10.6* 11.9* 12.0*  HCT 34.5* 34.1* 32.9* 36.2* 36.2*  MCV 92.5 93.2 90.6 91.2 87.2  PLT 114* 119* 141* 160 181   Blood Culture    Component Value Date/Time   SDES BLOOD SITE NOT SPECIFIED 08/18/2022 2130   SPECREQUEST  08/18/2022  2130    BOTTLES DRAWN AEROBIC AND ANAEROBIC Blood Culture adequate volume   CULT  08/18/2022 2130    NO GROWTH 5 DAYS Performed at [facility], [address], [place], [place] [address]    REPTSTATUS 08/23/2022 FINAL 08/18/2022 2130    Cardiac Enzymes: No results for input(s): "CKTOTAL", "CKMB", "CKMBINDEX", "TROPONINI" in the last 168 hours. CBG: Recent Labs  Lab 08/24/22 2136 08/25/22 0602 08/25/22 1306 08/25/22 2049 08/26/22 0622  GLUCAP 194* 185* 176* 123* 143*   Iron Studies: No results for input(s): "IRON", "TIBC", "TRANSFERRIN", "FERRITIN" in the last 72 hours. Lab Results  Component Value Date   INR 1.2 08/05/2022   INR 1.1 08/17/2019   INR 1.0 11/06/2017   Studies/Results: NM Myocar Multi W/Spect W/Wall Motion / EF  Result Date: 08/25/2022 CLINICAL DATA:  Heart failure. EXAM: MYOCARDIAL IMAGING WITH SPECT (REST AND PHARMACOLOGIC-STRESS) GATED LEFT VENTRICULAR WALL MOTION STUDY LEFT VENTRICULAR EJECTION FRACTION TECHNIQUE: Standard myocardial SPECT imaging was performed after resting intravenous injection of 10.7 mCi Tc-65m tetrofosmin. Subsequently, intravenous infusion of Lexiscan was performed under the supervision of the Cardiology staff. At peak effect of the drug, 32.3 mCi Tc-94m tetrofosmin was injected intravenously and standard myocardial SPECT  imaging was performed. Quantitative gated imaging was also performed to evaluate left ventricular wall motion, and estimate left ventricular ejection fraction. COMPARISON:  None Available. FINDINGS: Perfusion: There is a large, fixed defect involving the basal inferolateral, mid inferolateral, mid anterolateral and apicolateral segments. Imaging findings are concerning for infarction involving the left circumflex coronary artery. Wall Motion: There is markedly diminished global left ventricular wall motion with left ventricular dilatation Left Ventricular Ejection Fraction: 18 % End diastolic volume 175 ml End systolic  volume 295 ml IMPRESSION: 1. There is a large in size, severe, fixed defect involving the basal inferolateral, mid inferolateral, mid anterolateral and apical lateral segments. This is consistent with infarction involving the left circumflex coronary artery. 2. Globally diminished left ventricular wall motion and left ventricular dilatation. 3. Left ventricular ejection fraction 18% 4. Non invasive risk stratification*: High *2012 Appropriate Use Criteria for Coronary Revascularization Focused Update: J Am Coll Cardiol. 2012;59(9):857-881. http://content.dementiazones.com.aspx?articleid=[email or web address] Electronically Signed   By: Signa Kell M.D.   On: 08/25/2022 14:28   DG CHEST PORT 1 VIEW  Result Date: 08/25/2022 CLINICAL DATA:  In stage kidney disease with volume access. EXAM: PORTABLE CHEST 1 VIEW COMPARISON:  07/19/2022 FINDINGS: Unchanged position of left-sided ICD lead. There is a right chest wall dialysis catheter with tips in the superior cavoatrial junction and right atrium. Heart size is normal. Pulmonary vascular congestion is noted. No frank interstitial edema, pleural effusions or airspace disease. IMPRESSION: Pulmonary vascular congestion without frank interstitial edema. Electronically Signed   By: Signa Kell M.D.   On: 08/25/2022 10:28    Medications:    apixaban  5 mg Oral BID   atorvastatin  80 mg Oral QHS   carvedilol  25 mg Oral BID WC   Chlorhexidine Gluconate Cloth  6 each Topical Q0600   cinacalcet  30 mg Oral Q T,Th,Sa-HD   fenofibrate  160 mg Oral Daily   gabapentin  300 mg Oral BID   insulin aspart  0-9 Units Subcutaneous TID WC   insulin glargine-yfgn  12 Units Subcutaneous QHS   lanthanum  500 mg Oral TID WC   midodrine  10 mg Oral Q T,Th,Sa-HD   mometasone-formoterol  2 puff Inhalation BID   multivitamin  1 tablet Oral Daily   nortriptyline  25 mg Oral QHS   potassium chloride  40 mEq Oral Once    Dialysis Orders: [facility] ([facility]), TTS, EDW 91.5 kg    Assessment/Plan: 1.  Acute hypoxic respiratory failure: Possibly multifactorial but likely from volume overload in this patient with COPD, pneumonia less likely based on clinical picture. S/p serial HD, improving and O2 requirement is going down.  2.  End-stage renal disease:  Continue dialysis TTS. Next HD Sat. Below EDW by weights here. UF as able.  3. Hypokalemia - Use added K bath with dialysis. KcL ordered this am.   4. New onset Afib - Patient went into Afib with RVR during his routine HD 8/13.   Cardizem drip remains off (2nd hypotension) and in SR. On Coreg 25 BID, apixaban 5 BID per cardiology. Remains in SR.  5.  Hypotension: Blood pressure improved with HD, continue carvedilol and midodrine pre-HD. Hold Midodrine for SBP > 110. 6.  Anemia of chronic disease: Hgb at goal, no Fe or ESA is indicated at this time 7.  Secondary hyperparathyroidism: Calcium and phos controlled. Continue binders and Sensipar  8. Dispo - Remains inpatient.   Tomasa Blase PA-C [facility] 08/26/2022,9:44 AM

## 2022-08-26 NOTE — Progress Notes (Signed)
Physical Therapy Treatment Patient Details Name: Anthony Bullock MRN: [id number] DOB: 1961/01/06 Today's Date: 08/26/2022   History of Present Illness Pt is a 62 y/o M presenting to ED on 8/8 with SOB, volume overload. On 8/12, pt went into a fib with RVR after HD. PMH includes ESRD on HD TTS, HTN, HLD, DM2, insulin dependent with peripheral diabetic neuropahty, s/p bilateral BKA, anxiety, depression    PT Comments  Pt received sitting in the recliner and agreeable to session. Pt able to tolerate increased gait distance on RA with SpO2 WFL when reading is stable. Pt instructed in pursed lip breathing and pt reports no DOE during trial. Pt able to perform x5 STS with BUE support without instability and with O2 sats remaining >90%. Pt continues to benefit from PT services to progress toward functional mobility goals.     If plan is discharge home, recommend the following: Assistance with cooking/housework;Assist for transportation;Help with stairs or ramp for entrance   Can travel by private vehicle        Equipment Recommendations  None recommended by PT    Recommendations for Other Services       Precautions / Restrictions Precautions Precautions: Fall Precaution Comments: bil BKA with prostheses in room, watch O2 Restrictions Weight Bearing Restrictions: No     Mobility  Bed Mobility               General bed mobility comments: Pt beginning and ending session in recliner    Transfers Overall transfer level: Needs assistance Equipment used: Rolling walker (2 wheels) Transfers: Sit to/from Stand Sit to Stand: Supervision           General transfer comment: From recliner x6 with BUE support for power up and supervision for safety. No instability noted    Ambulation/Gait Ambulation/Gait assistance: Contact guard assist Gait Distance (Feet): 180 Feet Assistive device: Rolling walker (2 wheels) Gait Pattern/deviations: Step-through pattern, Decreased step length -  left, Decreased step length - right, Decreased stride length Gait velocity: decreased     General Gait Details: Steady step-through pattern with RW support. Pt reporting no DOE and mild fatigue      Balance Overall balance assessment: Needs assistance Sitting-balance support: Feet supported Sitting balance-Leahy Scale: Good Sitting balance - Comments: sitting in recliner   Standing balance support: Reliant on assistive device for balance, Bilateral upper extremity supported, During functional activity Standing balance-Leahy Scale: Poor Standing balance comment: with RW support                            Cognition Arousal: Alert Behavior During Therapy: WFL for tasks assessed/performed Overall Cognitive Status: Within Functional Limits for tasks assessed                                          Exercises      General Comments General comments (skin integrity, edema, etc.): Pt on RA throughout session with SpO2 pleth unstable during ambulation with readings as low as 82%, however pt asymptomatic and readings >90% with stable pleth.      Pertinent Vitals/Pain Pain Assessment Pain Assessment: No/denies pain     PT Goals (current goals can now be found in the care plan section) Acute Rehab PT Goals Patient Stated Goal: home PT Goal Formulation: With patient/family Time For Goal Achievement: 09/03/22 Potential to Achieve Goals:  Good Progress towards PT goals: Progressing toward goals    Frequency    Min 1X/week      PT Plan         AM-PAC PT "6 Clicks" Mobility   Outcome Measure  Help needed turning from your back to your side while in a flat bed without using bedrails?: A Little Help needed moving from lying on your back to sitting on the side of a flat bed without using bedrails?: A Little Help needed moving to and from a bed to a chair (including a wheelchair)?: A Little Help needed standing up from a chair using your arms  (e.g., wheelchair or bedside chair)?: A Little Help needed to walk in hospital room?: A Little Help needed climbing 3-5 steps with a railing? : A Lot 6 Click Score: 17    End of Session Equipment Utilized During Treatment: Gait belt Activity Tolerance: Patient tolerated treatment well Patient left: in chair;with call bell/phone within reach Nurse Communication: Mobility status PT Visit Diagnosis: Unsteadiness on feet (R26.81);Other abnormalities of gait and mobility (R26.89);Difficulty in walking, not elsewhere classified (R26.2);Muscle weakness (generalized) (M62.81)     Time: 5409-8119 PT Time Calculation (min) (ACUTE ONLY): 12 min  Charges:    $Gait Training: 8-22 mins PT General Charges $$ ACUTE PT VISIT: 1 Visit                     Johny Shock, PTA Acute Rehabilitation Services Secure Chat Preferred  [phone]    Johny Shock 08/26/2022, 2:15 PM

## 2022-08-26 NOTE — Care Management Important Message (Signed)
Important Message  Patient Details  Name: LAURENCE STELMACH MRN: [id number] Date of Birth: 1960/05/28   Medicare Important Message Given:  Yes     Sherilyn Banker 08/26/2022, 2:34 PM

## 2022-08-26 NOTE — Progress Notes (Signed)
Occupational Therapy Treatment Patient Details Name: Anthony Bullock MRN: [id number] DOB: Jan 14, 1960 Today's Date: 08/26/2022   History of present illness Pt is a 62 y/o M presenting to ED on 8/8 with SOB, volume overload. On 8/12, pt went into a fib with RVR after HD. PMH includes ESRD on HD TTS, HTN, HLD, DM2, insulin dependent with peripheral diabetic neuropahty, s/p bilateral BKA, anxiety, depression   OT comments  Patient with good progress toward goals, primary deficit is decreased activity tolerance with supplemental O2 need.  Patient is hoping to go home soon, he is probably close to his baseline, and will have assist as needed from his spouse.  OT will continue efforts to address deficits, but no post acute OT is anticipated.        If plan is discharge home, recommend the following:  A little help with walking and/or transfers;Help with stairs or ramp for entrance;Assist for transportation;A little help with bathing/dressing/bathroom;Assistance with cooking/housework   Equipment Recommendations  None recommended by OT    Recommendations for Other Services      Precautions / Restrictions Precautions Precautions: Fall Precaution Comments: bil BKA with prostheses in room, watch O2 Restrictions Weight Bearing Restrictions: No       Mobility Bed Mobility Overal bed mobility: Needs Assistance Bed Mobility: Sit to Supine     Supine to sit: Min assist       Patient Response: Cooperative  Transfers Overall transfer level: Needs assistance Equipment used: Rolling walker (2 wheels) Transfers: Sit to/from Stand Sit to Stand: Supervision                 Balance Overall balance assessment: Needs assistance Sitting-balance support: Feet supported Sitting balance-Leahy Scale: Good     Standing balance support: Reliant on assistive device for balance Standing balance-Leahy Scale: Poor                             ADL either performed or assessed with  clinical judgement   ADL       Grooming: Set up;Sitting;Oral care           Upper Body Dressing : Set up;Sitting   Lower Body Dressing: Set up;Sit to/from stand   Toilet Transfer: Supervision/safety;Rolling walker (2 wheels);Regular Toilet;Ambulation                  Extremity/Trunk Assessment Upper Extremity Assessment Upper Extremity Assessment: Overall WFL for tasks assessed   Lower Extremity Assessment Lower Extremity Assessment: Defer to PT evaluation   Cervical / Trunk Assessment Cervical / Trunk Assessment: Normal    Vision Baseline Vision/History: 1 Wears glasses     Perception Perception Perception: Not tested   Praxis Praxis Praxis: Not tested    Cognition Arousal: Alert Behavior During Therapy: WFL for tasks assessed/performed Overall Cognitive Status: Within Functional Limits for tasks assessed                                                             Pertinent Vitals/ Pain       Pain Assessment Pain Assessment: No/denies pain  Frequency  Min 1X/week        Progress Toward Goals  OT Goals(current goals can now be found in the care plan section)  Progress towards OT goals: Progressing toward goals  Acute Rehab OT Goals OT Goal Formulation: With patient Time For Goal Achievement: 09/03/22 Potential to Achieve Goals: Good  Plan      Co-evaluation                 AM-PAC OT "6 Clicks" Daily Activity     Outcome Measure   Help from another person eating meals?: None Help from another person taking care of personal grooming?: None Help from another person toileting, which includes using toliet, bedpan, or urinal?: A Little Help from another person bathing (including washing, rinsing, drying)?: A Little Help from another person to put on and taking off regular upper body clothing?: None Help from another person to  put on and taking off regular lower body clothing?: A Little 6 Click Score: 21    End of Session Equipment Utilized During Treatment: Rolling walker (2 wheels)  OT Visit Diagnosis: Unsteadiness on feet (R26.81);Other abnormalities of gait and mobility (R26.89);Muscle weakness (generalized) (M62.81)   Activity Tolerance Patient tolerated treatment well   Patient Left in chair;with call bell/phone within reach   Nurse Communication Mobility status        Time: 1200-1220 OT Time Calculation (min): 20 min  Charges: OT General Charges $OT Visit: 1 Visit OT Treatments $Self Care/Home Management : 8-22 mins  08/26/2022  RP, OTR/L  Acute Rehabilitation Services  Office:  [phone]   Suzanna Obey 08/26/2022, 12:58 PM

## 2022-08-27 DIAGNOSIS — D631 Anemia in chronic kidney disease: Secondary | ICD-10-CM | POA: Diagnosis not present

## 2022-08-27 DIAGNOSIS — N2581 Secondary hyperparathyroidism of renal origin: Secondary | ICD-10-CM | POA: Diagnosis not present

## 2022-08-27 DIAGNOSIS — N186 End stage renal disease: Secondary | ICD-10-CM | POA: Diagnosis not present

## 2022-08-27 DIAGNOSIS — N250 Renal osteodystrophy: Secondary | ICD-10-CM | POA: Diagnosis not present

## 2022-08-27 DIAGNOSIS — Z992 Dependence on renal dialysis: Secondary | ICD-10-CM | POA: Diagnosis not present

## 2022-08-29 ENCOUNTER — Telehealth: Payer: Self-pay

## 2022-08-29 NOTE — Transitions of Care (Post Inpatient/ED Visit) (Signed)
08/29/2022  Name: Anthony Bullock MRN: [id number] DOB: 08/26/1960  Today's TOC FU Call Status: Today's TOC FU Call Status:: Successful TOC FU Call Completed TOC FU Call Complete Date: 08/29/22  Transition Care Management Follow-up Telephone Call Date of Discharge: 08/26/22 Discharge Facility: [facility]) Type of Discharge: Inpatient Admission Primary Inpatient Discharge Diagnosis:: "ESRD on dialysis" How have you been since you were released from the hospital?: Better (Pt states he is 'still a little weak but getting better.' Denies any SOB or other acute sxs. Appetite good. Blood sugar 136 this AM. BM today. He went to dialysis on Sat and plans to go tomorrow-states tx went well.) Any questions or concerns?: No  Items Reviewed: Did you receive and understand the discharge instructions provided?: Yes Medications obtained,verified, and reconciled?: Yes (Medications Reviewed) Any new allergies since your discharge?: No Dietary orders reviewed?: Yes Type of Diet Ordered:: low salt/heart healthy/carb mdofiied/renal Do you have support at home?: Yes People in Home: spouse Name of Support/Comfort Primary Source: Darl Pikes  Medications Reviewed Today: Medications Reviewed Today     Reviewed by Charlyn Minerva, RN (Registered Nurse) on 08/29/22 at 1027  Med List Status: <None>   Medication Order Taking? Sig Documenting Provider Last Dose Status Informant  acetaminophen (TYLENOL) 325 MG tablet [id number] Yes Take 1-2 tablets (325-650 mg total) by mouth every 4 (four) hours as needed for mild pain. Jacquelynn Cree, PA-C Taking Active Self, Pharmacy Records  apixaban (ELIQUIS) 5 MG TABS tablet [id number] Yes Take 1 tablet (5 mg total) by mouth 2 (two) times daily. Rodolph Bong, MD Taking Active   atorvastatin (LIPITOR) 80 MG tablet [id number] Yes TAKE 1 TABLET BY MOUTH AT BEDTIME Kristian Covey, MD Taking Active Self, Pharmacy Records  azelastine (ASTELIN) 0.1 % nasal spray  [id number] Yes Place 2 sprays into both nostrils 3 (three) times daily as needed for rhinitis. Use in each nostril as directed Kristian Covey, MD Taking Active Self, Pharmacy Records  CALCITRIOL PO [id number] Yes Take by mouth 3 (three) times a week. ONLY PROVIDED AT DIALYSIS [provider] Taking Active Self, Pharmacy Records  carvedilol (COREG) 12.5 MG tablet [id number] Yes Take 1 tablet (12.5 mg total) by mouth 2 (two) times daily. Rodolph Bong, MD Taking Active   cinacalcet [facility]) 30 MG tablet [id number] Yes Take 1 tablet (30 mg total) by mouth Every Tuesday,Thursday,and Saturday with dialysis. Love, Evlyn Kanner, PA-C Taking Active Self, Pharmacy Records  Continuous Glucose Receiver (FREESTYLE LIBRE 3 READER) DEVI [id number] Yes Use to check blood glucose TID Kristian Covey, MD Taking Active Self, Pharmacy Records  Continuous Glucose Sensor (FREESTYLE LIBRE 14 [place]) [place] [id number] Yes USE AS DIRECTED EVERY 14 DAYS Burchette, Elberta Fortis, MD Taking Active Self, Pharmacy Records  Continuous Glucose Sensor (FREESTYLE LIBRE 3 [place]) [place] [id number] Yes Place 1 sensor on the skin every 14 days. Use to check glucose continuously Kristian Covey, MD Taking Active Self, Pharmacy Records  erythromycin ophthalmic ointment [id number] Yes Place 1 Application into the right eye at bedtime. [provider] Taking Active Self, Pharmacy Records  fenofibrate 160 MG tablet [id number] Yes TAKE 1 TABLET BY MOUTH ONCE DAILY Burchette, Elberta Fortis, MD Taking Active Self, Pharmacy Records  fexofenadine [facility]) 180 MG tablet [id number] Yes Take 180 mg by mouth daily as needed for allergies. [provider] Taking Active Self, Pharmacy Records  gabapentin (NEURONTIN) 300 MG capsule [id number] Yes TAKE 2 CAPSULES BY MOUTH TWICE DAILY . Burchette, Elberta Fortis,  MD Taking Active Self, Pharmacy Records  glucose blood test strip [id number] Yes Check 1 time daily. E11.9 One Touch Ultra Blue Test  Strips Kristian Covey, MD Taking Active Self, Pharmacy Records  HUMALOG KWIKPEN 200 UNIT/ML KwikPen [id number] Yes INJECT A MAXIMUM OF 28 UNITS SUBCUTANEOUSLY TWICE DAILY WITH LUNCH AND SUPPER PER SLIDING SCALE. APPOINTMENT REQUIRED FOR FUTURE REFILLS Burchette, Elberta Fortis, MD Taking Active Self, Pharmacy Records  Insulin Pen Needle (BD PEN NEEDLE NANO U/F) 32G X 4 MM MISC [id number] Yes USE 1 PEN NEEDLE SUBCUTANEOUSLY WITH INSULIN 4 TIMES DAILY Burchette, Elberta Fortis, MD Taking Active Self, Pharmacy Records  lanthanum (FOSRENOL) 500 MG chewable tablet [id number] Yes Chew 500-1,000 mg by mouth See admin instructions. Take 1000 mg with meals three time a day and 500 mg with snacks [provider] Taking Active Self, Pharmacy Records  LANTUS SOLOSTAR 100 UNIT/ML Solostar Pen [id number] Yes INJECT 12 UNITS SUBCUTANEOUSLY AT BEDTIME Kristian Covey, MD Taking Active Self, Pharmacy Records  midodrine (PROAMATINE) 10 MG tablet [id number] Yes Take 10 mg by mouth 3 (three) times a week. [provider] Taking Active Self, Pharmacy Records  multivitamin (RENA-VIT) TABS tablet [id number] Yes Take 1 tablet by mouth once daily Burchette, Elberta Fortis, MD Taking Active Self, Pharmacy Records  nortriptyline (PAMELOR) 25 MG capsule [id number] Yes Take 1 capsule (25 mg total) by mouth at bedtime. Sater, Pearletha Furl, MD Taking Active Self, Pharmacy Records  Olopatadine HCl 0.2 % SOLN [id number] Yes Place 1 drop into both eyes daily as needed (for allergies).  [provider] Taking Active Self, Pharmacy Records  tobramycin (TOBREX) 0.3 % ophthalmic solution [id number] Yes Place 1 drop into the right eye every 6 (six) hours. [provider] Taking Active Self, Pharmacy Records  VENTOLIN HFA 108 [phone] Base) MCG/ACT inhaler [id number] Yes Inhale 1-2 puffs into the lungs every 6 (six) hours as needed for wheezing or shortness of breath. Kristian Covey, MD Taking Active Self, Pharmacy Records  Med List  Note Carver Fila 07/09/15 1108): Pt has dialysis on Tuesdays, Thursdays, saturdays            Home Care and Equipment/Supplies: Were Home Health Services Ordered?: NA Any new equipment or medical supplies ordered?: NA  Functional Questionnaire: Do you need assistance with bathing/showering or dressing?: No Do you need assistance with meal preparation?: No Do you need assistance with eating?: No Do you have difficulty maintaining continence: No Do you need assistance with getting out of bed/getting out of a chair/moving?: No Do you have difficulty managing or taking your medications?: No  Follow up appointments reviewed: PCP Follow-up appointment confirmed?: Yes Date of PCP follow-up appointment?: 09/02/22 Follow-up Provider: Dr. Caryl Never Specialist [facility] Follow-up appointment confirmed?: No Reason Specialist Follow-Up Not Confirmed: Patient has Specialist Provider Number and will Call for Appointment (pt aware to call cardiology office to make appt) Do you need transportation to your follow-up appointment?: No (pt confirms wife takes him to appts) Do you understand care options if your condition(s) worsen?: Yes-patient verbalized understanding  SDOH Interventions Today    Flowsheet Row Most Recent Value  SDOH Interventions   Food Insecurity Interventions Intervention Not Indicated  Transportation Interventions Intervention Not Indicated        TOC Interventions Today    Flowsheet Row Most Recent Value  TOC Interventions   TOC Interventions Discussed/Reviewed Arranged PCP follow up within 7 days/Care Guide scheduled, TOC Interventions Discussed      Interventions Today    Flowsheet  Row Most Recent Value  Chronic Disease   Chronic disease during today's visit Diabetes, Chronic Kidney Disease/End Stage Renal Disease (ESRD)  General Interventions   General Interventions Discussed/Reviewed General Interventions Discussed, Durable Medical Equipment  (DME), Doctor Visits  Doctor Visits Discussed/Reviewed Doctor Visits Discussed, PCP, Specialist  Durable Medical Equipment (DME) Glucomoter  [pt reports he is checking blood sugars in the home, cbg 136 this morning]  PCP/Specialist Visits Compliance with follow-up visit  Education Interventions   Education Provided Provided Education  Provided Verbal Education On Nutrition, Blood Sugar Monitoring, Medication, When to see the doctor, Other  [sx mgmt]  Nutrition Interventions   Nutrition Discussed/Reviewed Nutrition Discussed, Adding fruits and vegetables, Increasing proteins, Decreasing fats, Decreasing salt, Decreasing sugar intake, Fluid intake  Pharmacy Interventions   Pharmacy Dicussed/Reviewed Pharmacy Topics Discussed, Medications and their functions  Safety Interventions   Safety Discussed/Reviewed Safety Discussed, Home Safety       Alessandra Grout [facility] Care Management Care Management Community Coordinator Direct Phone: [phone] Toll Free: [phone] Fax: [phone]

## 2022-08-29 NOTE — Progress Notes (Signed)
Late Note Entry- August 29, 2022  D/C summary faxed to clinic this morning for continuation of care.   Olivia Canter Renal Navigator [phone]

## 2022-08-30 DIAGNOSIS — N2581 Secondary hyperparathyroidism of renal origin: Secondary | ICD-10-CM | POA: Diagnosis not present

## 2022-08-30 DIAGNOSIS — D631 Anemia in chronic kidney disease: Secondary | ICD-10-CM | POA: Diagnosis not present

## 2022-08-30 DIAGNOSIS — Z992 Dependence on renal dialysis: Secondary | ICD-10-CM | POA: Diagnosis not present

## 2022-08-30 DIAGNOSIS — N250 Renal osteodystrophy: Secondary | ICD-10-CM | POA: Diagnosis not present

## 2022-08-30 DIAGNOSIS — N186 End stage renal disease: Secondary | ICD-10-CM | POA: Diagnosis not present

## 2022-09-01 DIAGNOSIS — D631 Anemia in chronic kidney disease: Secondary | ICD-10-CM | POA: Diagnosis not present

## 2022-09-01 DIAGNOSIS — N186 End stage renal disease: Secondary | ICD-10-CM | POA: Diagnosis not present

## 2022-09-01 DIAGNOSIS — N250 Renal osteodystrophy: Secondary | ICD-10-CM | POA: Diagnosis not present

## 2022-09-01 DIAGNOSIS — Z992 Dependence on renal dialysis: Secondary | ICD-10-CM | POA: Diagnosis not present

## 2022-09-01 DIAGNOSIS — N2581 Secondary hyperparathyroidism of renal origin: Secondary | ICD-10-CM | POA: Diagnosis not present

## 2022-09-02 ENCOUNTER — Ambulatory Visit: Payer: Medicare Other | Admitting: Family Medicine

## 2022-09-02 ENCOUNTER — Other Ambulatory Visit (HOSPITAL_COMMUNITY): Payer: Self-pay

## 2022-09-02 ENCOUNTER — Encounter: Payer: Self-pay | Admitting: Family Medicine

## 2022-09-02 ENCOUNTER — Telehealth: Payer: Self-pay

## 2022-09-02 VITALS — BP 100/60 | HR 80 | Temp 97.5°F | Ht 71.0 in | Wt 212.6 lb

## 2022-09-02 DIAGNOSIS — Z8709 Personal history of other diseases of the respiratory system: Secondary | ICD-10-CM

## 2022-09-02 DIAGNOSIS — E785 Hyperlipidemia, unspecified: Secondary | ICD-10-CM

## 2022-09-02 DIAGNOSIS — R5383 Other fatigue: Secondary | ICD-10-CM | POA: Diagnosis not present

## 2022-09-02 DIAGNOSIS — R400 Somnolence: Secondary | ICD-10-CM | POA: Diagnosis not present

## 2022-09-02 DIAGNOSIS — E876 Hypokalemia: Secondary | ICD-10-CM | POA: Diagnosis not present

## 2022-09-02 DIAGNOSIS — J9601 Acute respiratory failure with hypoxia: Secondary | ICD-10-CM

## 2022-09-02 DIAGNOSIS — I4891 Unspecified atrial fibrillation: Secondary | ICD-10-CM

## 2022-09-02 DIAGNOSIS — E1169 Type 2 diabetes mellitus with other specified complication: Secondary | ICD-10-CM

## 2022-09-02 DIAGNOSIS — Z992 Dependence on renal dialysis: Secondary | ICD-10-CM

## 2022-09-02 DIAGNOSIS — Z794 Long term (current) use of insulin: Secondary | ICD-10-CM | POA: Diagnosis not present

## 2022-09-02 DIAGNOSIS — I5022 Chronic systolic (congestive) heart failure: Secondary | ICD-10-CM | POA: Diagnosis not present

## 2022-09-02 DIAGNOSIS — N186 End stage renal disease: Secondary | ICD-10-CM

## 2022-09-02 LAB — BASIC METABOLIC PANEL
BUN: 34 mg/dL — ABNORMAL HIGH (ref 6–23)
CO2: 26 mEq/L (ref 19–32)
Calcium: 7.8 mg/dL — ABNORMAL LOW (ref 8.4–10.5)
Chloride: 94 mEq/L — ABNORMAL LOW (ref 96–112)
Creatinine, Ser: 7.54 mg/dL (ref 0.40–1.50)
GFR: 7.22 mL/min — CL (ref >60.00)
Glucose, Bld: 178 mg/dL — ABNORMAL HIGH (ref 70–99)
Potassium: 3.5 mEq/L (ref 3.5–5.1)
Sodium: 133 mEq/L — ABNORMAL LOW (ref 135–145)

## 2022-09-02 LAB — MAGNESIUM: Magnesium: 1.6 mg/dL (ref 1.5–2.5)

## 2022-09-02 MED ORDER — BENZONATATE 100 MG PO CAPS: 100.0000 mg | ORAL_CAPSULE | Freq: Three times a day (TID) | ORAL | 0 refills | Status: DC | PRN

## 2022-09-02 NOTE — Telephone Encounter (Signed)
CRITICAL VALUE STICKER  CRITICAL VALUE: Creatinine- 7.54, GFR-  7.22  RECEIVER (on-site recipient of call): Lashay Osborne G  DATE & TIME NOTIFIED:  3:18 pm 09/02/22  MESSENGER (representative from lab): Orrin Brigham  MD NOTIFIED: Via Epic   TIME OF NOTIFICATION: 3:19 PM  RESPONSE:  N/A

## 2022-09-02 NOTE — Progress Notes (Unsigned)
Established Patient Office Visit  Subjective   Patient ID: Anthony Bullock, male    DOB: March 01, 1960  Age: 62 y.o. MRN: [id number]  No chief complaint on file.   HPI  {History (Optional):23778} Anthony Bullock seen for follow-up from recent fairly lengthy hospitalization.  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  Presented to the ER day of admission with productive cough and chest congestion.  No reported fever.  No chest pain.  He had had some poor compliance with diet with eating a lot of processed foods.  Weight though surprisingly is not up on admission.  He was very dyspneic and hypoxic when he presented for dialysis and reportedly had O2 sat 70% room air..  Transported to hospital for further evaluation  He was placed on BiPAP.  There is some initial clinical suspicion pneumonia and patient was covered with ceftriaxone and Zithromax.  Became hypotensive and had to receive some Levophed and eventually weaned off.  White count 21.9 thousand.  Hemoglobin stable.  Elevated troponins.  proBNP 29,111  Regarding his hypoxic respiratory failure felt to be secondary to volume overload.  O2 sat started improved with hemodialysis.  Echocardiogram showed ejection fraction 30 to 35% with left ventricular global hypokinesis.  Moderate mitral valve regurgitation.  Was noted to have new onset atrial fibrillation and cardiology consulted.  Had several back-to-back hemodialysis sessions (4 in 5 days).    New onset atrial fibrillation with CHA2DS2-VASc score of 4.  Patient initially treated with Cardizem drip.  Maintained on carvedilol and started on Eliquis.  Had [facility] which showed prior infarct distribution of left circumflex but no acute ischemia.  Longstanding history  of diabetes which has been poorly controlled.  Recent A1c 8.7%.  He is having some general fatigue and suggestion was for sleep study to rule out obstructive sleep apnea.  His wife noticed that he has a long history of snoring.  Has had some observed apnea episodes in the past.  Significant daytime fatigue and daytime somnolence.  Also recommendation for repeat basic metabolic panel and magnesium level at follow-up.  Was apparently given a little bit of magnesium prior to discharge.  Diagnosed with right oculomotor nerve palsy felt possibly to be ischemic.  Was evaluated by ophthalmology.  Cannot get MRI because of defibrillator.  Had had recent CT angiogram and CT scan of head.  Past Medical History:  Diagnosis Date   AICD (automatic cardioverter/defibrillator) present    [employer]   Allergic rhinitis    Anemia    Arthritis    Chronic systolic heart failure (HCC)    a. ECHO (12/2012) EF 25-30%, HK entireanteroseptal myocardium //  b.  EF 25%, diffuse HK, grade 1 diastolic dysfunction, MAC, mild LAE, normal RVSF, trivial pericardial effusion   COPD (chronic obstructive pulmonary disease) (HCC)    Diabetes mellitus type II    Diabetic nephropathy (HCC)    Diabetic neuropathy (HCC)    ESRD on hemodialysis (HCC)    started HD June 2017, goes to [facility] HD unit, Dr Fausto Skillern   History of cardiac catheterization    a.Myoview 1/15:  There is significant left ventricular dysfunction. There may be slight scar at the apex. There is no significant ischemia. LV Ejection Fraction: 27%  //  b. RHC/LHC (1/15) with mean RA 6, PA 47/22 mean 33,  mean PCWP 20, PVR 2.5 WU, CI 2.5; 80% dLAD stenosis, 70% diffuse large D.     History of kidney stones    Hyperlipidemia    Hypertension    Kidney stones    NICM (nonischemic cardiomyopathy) (HCC)    Primarily nonischemic.  Echo (12/14) with EF 25-30%.  Echo (3/15) with EF 25%, mild to moderately dilated LV, normal RV size and systolic function.      Osteomyelitis (HCC)    left fifth ray   Pneumonia    Urethral stricture    Wears glasses    Past Surgical History:  Procedure Laterality Date   ABDOMINAL AORTOGRAM W/LOWER EXTREMITY N/A 03/30/2016   Procedure: Abdominal Aortogram w/Lower Extremity;  Surgeon: Chuck Hint, MD;  Location: [facility] INVASIVE CV LAB;  Service: Cardiovascular;  Laterality: N/A;   AMPUTATION Right 04/26/2016   Procedure: Right Below Knee Amputation;  Surgeon: Nadara Mustard, MD;  Location: [facility] OR;  Service: Orthopedics;  Laterality: Right;   AMPUTATION Left 08/21/2019   Procedure: LEFT FOOT 5TH RAY AMPUTATION;  Surgeon: Nadara Mustard, MD;  Location: [facility] OR;  Service: Orthopedics;  Laterality: Left;   AMPUTATION Left 11/13/2019   Procedure: LEFT BELOW KNEE AMPUTATION;  Surgeon: Nadara Mustard, MD;  Location: [facility] OR;  Service: Orthopedics;  Laterality: Left;   AV FISTULA PLACEMENT Right 09/08/2015   Procedure: INSERTION OF 4-53mm x 45cm  ARTERIOVENOUS (AV) GORE-TEX GRAFT RIGHT UPPER  ARM;  Surgeon: Chuck Hint, MD;  Location: [facility] OR;  Service: Vascular;  Laterality: Right;   AV FISTULA PLACEMENT Left 01/14/2016   Procedure: CREATION OF LEFT UPPER ARM ARTERIOVENOUS FISTULA;  Surgeon: Chuck Hint, MD;  Location: [facility] OR;  Service: Vascular;  Laterality: Left;   BASCILIC VEIN TRANSPOSITION Right 08/22/2014   Procedure: RIGHT UPPER ARM BASCILIC VEIN TRANSPOSITION;  Surgeon: Chuck Hint, MD;  Location: [facility] OR;  Service: Vascular;  Laterality: Right;   BELOW KNEE LEG AMPUTATION Right 04/26/2016   CARDIAC CATHETERIZATION     CARDIAC DEFIBRILLATOR PLACEMENT  06/27/2013   Sub Q       BY DR Graciela Husbands   CATARACT EXTRACTION W/PHACO Right 08/06/2018   Procedure: CATARACT EXTRACTION PHACO AND INTRAOCULAR LENS PLACEMENT (IOC);  Surgeon: Fabio Pierce, MD;  Location: [facility] ORS;  Service: Ophthalmology;  Laterality: Right;  CDE: 4.06   CATARACT EXTRACTION W/PHACO Left 08/20/2018   Procedure: CATARACT EXTRACTION PHACO AND  INTRAOCULAR LENS PLACEMENT (IOC);  Surgeon: Fabio Pierce, MD;  Location: [facility] ORS;  Service: Ophthalmology;  Laterality: Left;  CDE: 6.76   COLONOSCOPY WITH PROPOFOL N/A 07/22/2015   Procedure: COLONOSCOPY WITH PROPOFOL;  Surgeon: Sherrilyn Rist, MD;  Location: [facility] ENDOSCOPY;  Service: Gastroenterology;  Laterality: N/A;   FEMORAL-POPLITEAL BYPASS GRAFT Right 03/31/2016   Procedure: BYPASS GRAFT FEMORAL-POPLITEAL ARTERY USING RIGHT GREATER SAPHENOUS NONREVERSED VEIN;  Surgeon: Chuck Hint, MD;  Location: [facility] OR;  Service: Vascular;  Laterality: Right;   HERNIA REPAIR     I & D EXTREMITY Right 03/31/2016   Procedure: IRRIGATION AND DEBRIDEMENT FOOT;  Surgeon: Chuck Hint, MD;  Location: [facility] OR;  Service: Vascular;  Laterality: Right;   IMPLANTABLE CARDIOVERTER DEFIBRILLATOR IMPLANT N/A 06/27/2013   Procedure: SUB Q ICD;  Surgeon: Duke Salvia, MD;  Location: [facility] CATH LAB;  Service: Cardiovascular;  Laterality: N/A;   INTRAOPERATIVE ARTERIOGRAM Right 03/31/2016   Procedure: INTRA OPERATIVE ARTERIOGRAM;  Surgeon: Chuck Hint, MD;  Location: [facility] OR;  Service: Vascular;  Laterality: Right;  IR GENERIC HISTORICAL Right 11/30/2015   IR THROMBECTOMY AV FISTULA W/THROMBOLYSIS/PTA INC/SHUNT/IMG RIGHT 11/30/2015 Irish Lack, MD [facility]INTER[facility] RAD   IR GENERIC HISTORICAL  11/30/2015   IR US GUIDE VASC ACCESS RIGHT 11/30/2015 Irish Lack, MD [facility]   IR GENERIC HISTORICAL Right 12/15/2015   IR THROMBECTOMY AV FISTULA W/THROMBOLYSIS/PTA/STENT INC/SHUNT/IMG RT 12/15/2015 Oley Balm, MD [facility]   IR GENERIC HISTORICAL  12/15/2015   IR US GUIDE VASC ACCESS RIGHT 12/15/2015 Oley Balm, MD [facility]   IR GENERIC HISTORICAL  12/28/2015   IR FLUORO GUIDE CV LINE RIGHT 12/28/2015 Jolaine Click, MD [facility]   IR GENERIC HISTORICAL  12/28/2015   IR US GUIDE VASC ACCESS RIGHT 12/28/2015 Jolaine Click, MD [facility]   LEFT A ND RIGHT HEART CATH  01/30/2013   DR  Jones Broom   LEFT AND RIGHT HEART CATHETERIZATION WITH CORONARY ANGIOGRAM N/A 01/30/2013   Procedure: LEFT AND RIGHT HEART CATHETERIZATION WITH CORONARY ANGIOGRAM;  Surgeon: Dolores Patty, MD;  Location: [facility] CATH LAB;  Service: Cardiovascular;  Laterality: N/A;   PERIPHERAL VASCULAR CATHETERIZATION Right 01/26/2015   Procedure: A/V Fistulagram;  Surgeon: Chuck Hint, MD;  Location: [facility] INVASIVE CV LAB;  Service: Cardiovascular;  Laterality: Right;   reapea urethral surgery for recurrent obstruction  2011   TOTAL KNEE ARTHROPLASTY Right 2007   VEIN HARVEST Right 03/31/2016   Procedure: RIGHT GREATER SAPHENOUS VEIN HARVEST;  Surgeon: Chuck Hint, MD;  Location: [facility] OR;  Service: Vascular;  Laterality: Right;    reports that he quit smoking about 12 years ago. His smoking use included cigarettes. He started smoking about 44 years ago. He has a 64 pack-year smoking history. He has never used smokeless tobacco. He reports that he does not drink alcohol and does not use drugs. family history includes Alcohol abuse in his father; Bladder Cancer in his mother; Diabetes in his maternal grandmother; Heart Problems in his maternal grandmother; Heart disease in his maternal grandfather; Melanoma in his father; Prostate cancer in his maternal grandfather; Stroke in his maternal grandmother. Allergies  Allergen Reactions   Epoetin Alfa Other (See Comments)    unknown   Ferumoxytol Other (See Comments)    unknown   Morphine Sulfate Rash and Other (See Comments)    Itches all over, red spots    Review of Systems  Constitutional:  Positive for malaise/fatigue. Negative for chills and fever.  Respiratory:  Negative for cough, hemoptysis, sputum production, shortness of breath and wheezing.   Cardiovascular:  Negative for chest pain.  Neurological:  Negative for dizziness, weakness and headaches.      Objective:     BP 100/60 (BP Location: Left Arm, Patient Position: Sitting, Cuff  Size: Normal)   Pulse 80   Temp (!) 97.5 F (36.4 C) (Oral)   Ht 5\' 11"  (1.803 m)   Wt 212 lb 9.6 oz (96.4 kg)   SpO2 97%   BMI 29.65 kg/m  BP Readings from Last 3 Encounters:  09/02/22 100/60  08/26/22 93/60  08/09/22 132/66   Wt Readings from Last 3 Encounters:  09/02/22 212 lb 9.6 oz (96.4 kg)  08/23/22 (S) 196 lb 10.4 oz (89.2 kg)  08/09/22 213 lb (96.6 kg)      Physical Exam Vitals reviewed.  Constitutional:      General: He is not in acute distress.    Appearance: Normal appearance. He is not ill-appearing.  Cardiovascular:     Rate and Rhythm: Normal rate and regular rhythm.  Pulmonary:  Effort: Pulmonary effort is normal.     Breath sounds: Normal breath sounds.  Neurological:     General: No focal deficit present.     Mental Status: He is alert.      Results for orders placed or performed in visit on 09/02/22  Basic metabolic panel  Result Value Ref Range   Sodium 133 (L) 135 - 145 mEq/L   Potassium 3.5 3.5 - 5.1 mEq/L   Chloride 94 (L) 96 - 112 mEq/L   CO2 26 19 - 32 mEq/L   Glucose, Bld 178 (H) 70 - 99 mg/dL   BUN 34 (H) 6 - 23 mg/dL   Creatinine, Ser 4.74 (HH) 0.40 - 1.50 mg/dL   GFR 2.59 (LL) >56.38 mL/min   Calcium 7.8 (L) 8.4 - 10.5 mg/dL  Magnesium  Result Value Ref Range   Magnesium 1.6 1.5 - 2.5 mg/dL    {Labs (VFIEPPIR):51884}  The 10-year ASCVD risk score (Arnett DK, et al., 2019) is: 10.2%    Assessment & Plan:   #1 recent acute hypoxic respiratory failure secondary to volume overload.  Poor dietary discretion.  Patient received several back-to-back hemodialysis events and is stable at this time.  Is back on his regular hemodialysis schedule.  O2 sats today room air 97%  #2 recent new onset atrial fibrillation with CHA2DS2-VASc score of 4.  Patient on carvedilol.  Appears to be in sinus rhythm at this time.  Also taking Eliquis regularly.  Continue close follow-up with cardiology  #3 systolic heart failure with recent echo  ejection fraction 30 to 35%.  Left ventricular global hypokinesis with grade 2 diastolic dysfunction.  Moderately dilated left atrial size and moderate MVR.  #4 end-stage renal disease on hemodialysis.  He is back on his usual hemodialysis schedule.  #5 concern for possible obstructive sleep apnea.  Set up referral  #6 history of CAD with elevated troponin.  Myoview stress test showed prior infarct but no acute ischemia.  #7 type 2 diabetes.  Recent A1c 8.7%.  He is on Lantus and Humalog sliding scale  No follow-ups on file.    Evelena Peat, MD

## 2022-09-02 NOTE — Telephone Encounter (Signed)
Noted  

## 2022-09-03 DIAGNOSIS — Z992 Dependence on renal dialysis: Secondary | ICD-10-CM | POA: Diagnosis not present

## 2022-09-03 DIAGNOSIS — N2581 Secondary hyperparathyroidism of renal origin: Secondary | ICD-10-CM | POA: Diagnosis not present

## 2022-09-03 DIAGNOSIS — N250 Renal osteodystrophy: Secondary | ICD-10-CM | POA: Diagnosis not present

## 2022-09-03 DIAGNOSIS — D631 Anemia in chronic kidney disease: Secondary | ICD-10-CM | POA: Diagnosis not present

## 2022-09-03 DIAGNOSIS — N186 End stage renal disease: Secondary | ICD-10-CM | POA: Diagnosis not present

## 2022-09-06 ENCOUNTER — Emergency Department (HOSPITAL_COMMUNITY): Payer: Medicare Other

## 2022-09-06 ENCOUNTER — Inpatient Hospital Stay (HOSPITAL_COMMUNITY)
Admission: EM | Admit: 2022-09-06 | Discharge: 2022-09-09 | DRG: 871 | Disposition: A | Discharge status: Home / Self Care | Payer: Medicare Other | Attending: Internal Medicine | Admitting: Internal Medicine

## 2022-09-06 ENCOUNTER — Encounter (HOSPITAL_COMMUNITY): Payer: Self-pay | Admitting: Internal Medicine

## 2022-09-06 ENCOUNTER — Inpatient Hospital Stay (HOSPITAL_COMMUNITY): Payer: Medicare Other

## 2022-09-06 ENCOUNTER — Other Ambulatory Visit: Payer: Self-pay

## 2022-09-06 DIAGNOSIS — S88112S Complete traumatic amputation at level between knee and ankle, left lower leg, sequela: Secondary | ICD-10-CM | POA: Diagnosis not present

## 2022-09-06 DIAGNOSIS — A4189 Other specified sepsis: Principal | ICD-10-CM | POA: Diagnosis present

## 2022-09-06 DIAGNOSIS — N2581 Secondary hyperparathyroidism of renal origin: Secondary | ICD-10-CM | POA: Diagnosis not present

## 2022-09-06 DIAGNOSIS — I480 Paroxysmal atrial fibrillation: Secondary | ICD-10-CM | POA: Diagnosis not present

## 2022-09-06 DIAGNOSIS — Z7901 Long term (current) use of anticoagulants: Secondary | ICD-10-CM | POA: Diagnosis not present

## 2022-09-06 DIAGNOSIS — J1282 Pneumonia due to coronavirus disease 2019: Secondary | ICD-10-CM | POA: Insufficient documentation

## 2022-09-06 DIAGNOSIS — J440 Chronic obstructive pulmonary disease with acute lower respiratory infection: Secondary | ICD-10-CM | POA: Diagnosis present

## 2022-09-06 DIAGNOSIS — I428 Other cardiomyopathies: Secondary | ICD-10-CM | POA: Diagnosis not present

## 2022-09-06 DIAGNOSIS — E1169 Type 2 diabetes mellitus with other specified complication: Secondary | ICD-10-CM

## 2022-09-06 DIAGNOSIS — Z95810 Presence of automatic (implantable) cardiac defibrillator: Secondary | ICD-10-CM

## 2022-09-06 DIAGNOSIS — I5023 Acute on chronic systolic (congestive) heart failure: Secondary | ICD-10-CM | POA: Insufficient documentation

## 2022-09-06 DIAGNOSIS — N186 End stage renal disease: Secondary | ICD-10-CM | POA: Diagnosis present

## 2022-09-06 DIAGNOSIS — J9601 Acute respiratory failure with hypoxia: Secondary | ICD-10-CM | POA: Diagnosis present

## 2022-09-06 DIAGNOSIS — Z8249 Family history of ischemic heart disease and other diseases of the circulatory system: Secondary | ICD-10-CM

## 2022-09-06 DIAGNOSIS — R0902 Hypoxemia: Principal | ICD-10-CM

## 2022-09-06 DIAGNOSIS — E1140 Type 2 diabetes mellitus with diabetic neuropathy, unspecified: Secondary | ICD-10-CM | POA: Diagnosis not present

## 2022-09-06 DIAGNOSIS — Z811 Family history of alcohol abuse and dependence: Secondary | ICD-10-CM

## 2022-09-06 DIAGNOSIS — E1122 Type 2 diabetes mellitus with diabetic chronic kidney disease: Secondary | ICD-10-CM | POA: Diagnosis not present

## 2022-09-06 DIAGNOSIS — R0689 Other abnormalities of breathing: Secondary | ICD-10-CM | POA: Diagnosis not present

## 2022-09-06 DIAGNOSIS — Z8052 Family history of malignant neoplasm of bladder: Secondary | ICD-10-CM

## 2022-09-06 DIAGNOSIS — E785 Hyperlipidemia, unspecified: Secondary | ICD-10-CM | POA: Diagnosis present

## 2022-09-06 DIAGNOSIS — Z87891 Personal history of nicotine dependence: Secondary | ICD-10-CM

## 2022-09-06 DIAGNOSIS — Z89512 Acquired absence of left leg below knee: Secondary | ICD-10-CM

## 2022-09-06 DIAGNOSIS — I4891 Unspecified atrial fibrillation: Secondary | ICD-10-CM | POA: Diagnosis present

## 2022-09-06 DIAGNOSIS — I120 Hypertensive chronic kidney disease with stage 5 chronic kidney disease or end stage renal disease: Secondary | ICD-10-CM | POA: Diagnosis not present

## 2022-09-06 DIAGNOSIS — E1165 Type 2 diabetes mellitus with hyperglycemia: Secondary | ICD-10-CM | POA: Diagnosis present

## 2022-09-06 DIAGNOSIS — S88112A Complete traumatic amputation at level between knee and ankle, left lower leg, initial encounter: Secondary | ICD-10-CM | POA: Diagnosis present

## 2022-09-06 DIAGNOSIS — I509 Heart failure, unspecified: Secondary | ICD-10-CM | POA: Diagnosis not present

## 2022-09-06 DIAGNOSIS — I2510 Atherosclerotic heart disease of native coronary artery without angina pectoris: Secondary | ICD-10-CM | POA: Diagnosis present

## 2022-09-06 DIAGNOSIS — E1151 Type 2 diabetes mellitus with diabetic peripheral angiopathy without gangrene: Secondary | ICD-10-CM | POA: Diagnosis not present

## 2022-09-06 DIAGNOSIS — I5043 Acute on chronic combined systolic (congestive) and diastolic (congestive) heart failure: Secondary | ICD-10-CM | POA: Diagnosis present

## 2022-09-06 DIAGNOSIS — Z79899 Other long term (current) drug therapy: Secondary | ICD-10-CM

## 2022-09-06 DIAGNOSIS — R069 Unspecified abnormalities of breathing: Secondary | ICD-10-CM | POA: Diagnosis not present

## 2022-09-06 DIAGNOSIS — U071 COVID-19: Secondary | ICD-10-CM | POA: Diagnosis present

## 2022-09-06 DIAGNOSIS — R062 Wheezing: Secondary | ICD-10-CM | POA: Diagnosis not present

## 2022-09-06 DIAGNOSIS — Z823 Family history of stroke: Secondary | ICD-10-CM

## 2022-09-06 DIAGNOSIS — Z794 Long term (current) use of insulin: Secondary | ICD-10-CM | POA: Diagnosis not present

## 2022-09-06 DIAGNOSIS — I132 Hypertensive heart and chronic kidney disease with heart failure and with stage 5 chronic kidney disease, or end stage renal disease: Secondary | ICD-10-CM | POA: Diagnosis not present

## 2022-09-06 DIAGNOSIS — Z992 Dependence on renal dialysis: Secondary | ICD-10-CM

## 2022-09-06 DIAGNOSIS — Z89511 Acquired absence of right leg below knee: Secondary | ICD-10-CM

## 2022-09-06 DIAGNOSIS — D631 Anemia in chronic kidney disease: Secondary | ICD-10-CM | POA: Diagnosis not present

## 2022-09-06 DIAGNOSIS — Z808 Family history of malignant neoplasm of other organs or systems: Secondary | ICD-10-CM

## 2022-09-06 DIAGNOSIS — Z833 Family history of diabetes mellitus: Secondary | ICD-10-CM

## 2022-09-06 DIAGNOSIS — Z96651 Presence of right artificial knee joint: Secondary | ICD-10-CM | POA: Diagnosis present

## 2022-09-06 DIAGNOSIS — R0989 Other specified symptoms and signs involving the circulatory and respiratory systems: Secondary | ICD-10-CM | POA: Diagnosis not present

## 2022-09-06 DIAGNOSIS — I959 Hypotension, unspecified: Secondary | ICD-10-CM | POA: Diagnosis not present

## 2022-09-06 DIAGNOSIS — Z8042 Family history of malignant neoplasm of prostate: Secondary | ICD-10-CM

## 2022-09-06 DIAGNOSIS — R0602 Shortness of breath: Secondary | ICD-10-CM | POA: Diagnosis not present

## 2022-09-06 LAB — CBC WITH DIFFERENTIAL/PLATELET
Abs Immature Granulocytes: 0.05 10*3/uL (ref 0.00–0.07)
Basophils Absolute: 0.1 10*3/uL (ref 0.0–0.1)
Basophils Relative: 1 %
Eosinophils Absolute: 0.3 10*3/uL (ref 0.0–0.5)
Eosinophils Relative: 2 %
HCT: 38.7 % — ABNORMAL LOW (ref 39.0–52.0)
Hemoglobin: 12.5 g/dL — ABNORMAL LOW (ref 13.0–17.0)
Immature Granulocytes: 0 %
Lymphocytes Relative: 9 %
Lymphs Abs: 1.2 10*3/uL (ref 0.7–4.0)
MCH: 29.8 pg (ref 26.0–34.0)
MCHC: 32.3 g/dL (ref 30.0–36.0)
MCV: 92.1 fL (ref 80.0–100.0)
Monocytes Absolute: 1.1 10*3/uL — ABNORMAL HIGH (ref 0.1–1.0)
Monocytes Relative: 8 %
Neutro Abs: 11.2 10*3/uL — ABNORMAL HIGH (ref 1.7–7.7)
Neutrophils Relative %: 80 %
Platelets: 238 10*3/uL (ref 150–400)
RBC: 4.2 MIL/uL — ABNORMAL LOW (ref 4.22–5.81)
RDW: 16.4 % — ABNORMAL HIGH (ref 11.5–15.5)
WBC: 14 10*3/uL — ABNORMAL HIGH (ref 4.0–10.5)
nRBC: 0 % (ref 0.0–0.2)

## 2022-09-06 LAB — BLOOD GAS, ARTERIAL
Acid-base deficit: 2.9 mmol/L — ABNORMAL HIGH (ref 0.0–2.0)
Bicarbonate: 24.8 mmol/L (ref 20.0–28.0)
Drawn by: 22223
O2 Saturation: 88.9 %
Patient temperature: 37
pCO2 arterial: 54 mmHg — ABNORMAL HIGH (ref 32–48)
pH, Arterial: 7.27 — ABNORMAL LOW (ref 7.35–7.45)
pO2, Arterial: 59 mmHg — ABNORMAL LOW (ref 83–108)

## 2022-09-06 LAB — COMPREHENSIVE METABOLIC PANEL
ALT: 23 U/L (ref 0–44)
AST: 27 U/L (ref 15–41)
Albumin: 3.2 g/dL — ABNORMAL LOW (ref 3.5–5.0)
Alkaline Phosphatase: 65 U/L (ref 38–126)
Anion gap: 16 — ABNORMAL HIGH (ref 5–15)
BUN: 49 mg/dL — ABNORMAL HIGH (ref 6–20)
CO2: 24 mmol/L (ref 22–32)
Calcium: 8.7 mg/dL — ABNORMAL LOW (ref 8.9–10.3)
Chloride: 93 mmol/L — ABNORMAL LOW (ref 98–111)
Creatinine, Ser: 10.06 mg/dL — ABNORMAL HIGH (ref 0.61–1.24)
GFR, Estimated: 5 mL/min — ABNORMAL LOW (ref >60)
Glucose, Bld: 140 mg/dL — ABNORMAL HIGH (ref 70–99)
Potassium: 3.5 mmol/L (ref 3.5–5.1)
Sodium: 133 mmol/L — ABNORMAL LOW (ref 135–145)
Total Bilirubin: 0.7 mg/dL (ref 0.3–1.2)
Total Protein: 7.2 g/dL (ref 6.5–8.1)

## 2022-09-06 LAB — BASIC METABOLIC PANEL
Anion gap: 16 — ABNORMAL HIGH (ref 5–15)
BUN: 59 mg/dL — ABNORMAL HIGH (ref 6–20)
CO2: 21 mmol/L — ABNORMAL LOW (ref 22–32)
Calcium: 7.8 mg/dL — ABNORMAL LOW (ref 8.9–10.3)
Chloride: 93 mmol/L — ABNORMAL LOW (ref 98–111)
Creatinine, Ser: 11.71 mg/dL — ABNORMAL HIGH (ref 0.61–1.24)
GFR, Estimated: 4 mL/min — ABNORMAL LOW (ref >60)
Glucose, Bld: 339 mg/dL — ABNORMAL HIGH (ref 70–99)
Potassium: 4.2 mmol/L (ref 3.5–5.1)
Sodium: 130 mmol/L — ABNORMAL LOW (ref 135–145)

## 2022-09-06 LAB — LACTIC ACID, PLASMA
Lactic Acid, Venous: 2.6 mmol/L (ref 0.5–1.9)
Lactic Acid, Venous: 0.9 mmol/L (ref 0.5–1.9)

## 2022-09-06 LAB — SARS CORONAVIRUS 2 BY RT PCR: SARS Coronavirus 2 by RT PCR: POSITIVE — AB

## 2022-09-06 LAB — C-REACTIVE PROTEIN: CRP: 2.3 mg/dL — ABNORMAL HIGH (ref <1.0)

## 2022-09-06 LAB — GLUCOSE, CAPILLARY
Glucose-Capillary: 213 mg/dL — ABNORMAL HIGH (ref 70–99)
Glucose-Capillary: 199 mg/dL — ABNORMAL HIGH (ref 70–99)
Glucose-Capillary: 307 mg/dL — ABNORMAL HIGH (ref 70–99)
Glucose-Capillary: 288 mg/dL — ABNORMAL HIGH (ref 70–99)

## 2022-09-06 LAB — CBC
HCT: 43 % (ref 39.0–52.0)
Hemoglobin: 13.7 g/dL (ref 13.0–17.0)
MCH: 30.2 pg (ref 26.0–34.0)
MCHC: 31.9 g/dL (ref 30.0–36.0)
MCV: 94.9 fL (ref 80.0–100.0)
Platelets: 340 10*3/uL (ref 150–400)
RBC: 4.53 MIL/uL (ref 4.22–5.81)
RDW: 16.7 % — ABNORMAL HIGH (ref 11.5–15.5)
WBC: 19.7 10*3/uL — ABNORMAL HIGH (ref 4.0–10.5)
nRBC: 0 % (ref 0.0–0.2)

## 2022-09-06 LAB — HEPATITIS B SURFACE ANTIGEN: Hepatitis B Surface Ag: NONREACTIVE

## 2022-09-06 LAB — D-DIMER, QUANTITATIVE: D-Dimer, Quant: 0.82 ug{FEU}/mL — ABNORMAL HIGH (ref 0.00–0.50)

## 2022-09-06 LAB — PROCALCITONIN
Procalcitonin: 1.7 ng/mL
Procalcitonin: 1.98 ng/mL

## 2022-09-06 MED ORDER — INSULIN ASPART 100 UNIT/ML IJ SOLN
0.0000 [IU] | Freq: Three times a day (TID) | INTRAMUSCULAR | Status: DC
  Administered 2022-09-06: 2 [IU] via SUBCUTANEOUS
  Administered 2022-09-06: 3 [IU] via SUBCUTANEOUS
  Administered 2022-09-07 (×2): 5 [IU] via SUBCUTANEOUS
  Administered 2022-09-07: 3 [IU] via SUBCUTANEOUS
  Administered 2022-09-08: 7 [IU] via SUBCUTANEOUS
  Administered 2022-09-08: 5 [IU] via SUBCUTANEOUS
  Administered 2022-09-08 – 2022-09-09 (×2): 7 [IU] via SUBCUTANEOUS
  Administered 2022-09-09: 9 [IU] via SUBCUTANEOUS

## 2022-09-06 MED ORDER — CARVEDILOL 12.5 MG PO TABS
12.5000 mg | ORAL_TABLET | Freq: Two times a day (BID) | ORAL | Status: DC
  Administered 2022-09-07 – 2022-09-09 (×4): 12.5 mg via ORAL
  Filled 2022-09-06 (×7): qty 1

## 2022-09-06 MED ORDER — ONDANSETRON HCL 4 MG PO TABS: 4.0000 mg | ORAL_TABLET | Freq: Four times a day (QID) | ORAL | Status: DC | PRN

## 2022-09-06 MED ORDER — SODIUM CHLORIDE 0.9 % IV SOLN
100.0000 mg | Freq: Every day | INTRAVENOUS | Status: AC
  Administered 2022-09-07 – 2022-09-08 (×2): 100 mg via INTRAVENOUS
  Filled 2022-09-06 (×2): qty 100

## 2022-09-06 MED ORDER — SODIUM CHLORIDE 0.9 % IV SOLN
100.0000 mg | INTRAVENOUS | Status: AC
  Filled 2022-09-06 (×2): qty 20

## 2022-09-06 MED ORDER — DEXAMETHASONE SODIUM PHOSPHATE 10 MG/ML IJ SOLN
6.0000 mg | Freq: Once | INTRAMUSCULAR | Status: AC
  Administered 2022-09-06: 6 mg via INTRAVENOUS
  Filled 2022-09-06: qty 1

## 2022-09-06 MED ORDER — AMIODARONE HCL IN DEXTROSE 360-4.14 MG/200ML-% IV SOLN
60.0000 mg/h | INTRAVENOUS | Status: AC
  Administered 2022-09-07 (×2): 60 mg/h via INTRAVENOUS
  Filled 2022-09-06 (×2): qty 200

## 2022-09-06 MED ORDER — INSULIN GLARGINE-YFGN 100 UNIT/ML ~~LOC~~ SOLN
6.0000 [IU] | Freq: Every day | SUBCUTANEOUS | Status: DC
  Administered 2022-09-07 (×2): 6 [IU] via SUBCUTANEOUS
  Filled 2022-09-06 (×4): qty 0.06

## 2022-09-06 MED ORDER — ACETAMINOPHEN 650 MG RE SUPP: 650.0000 mg | Freq: Four times a day (QID) | RECTAL | Status: DC | PRN

## 2022-09-06 MED ORDER — ACETAMINOPHEN 325 MG PO TABS
650.0000 mg | ORAL_TABLET | Freq: Four times a day (QID) | ORAL | Status: DC | PRN
  Administered 2022-09-09: 650 mg via ORAL
  Filled 2022-09-06: qty 2

## 2022-09-06 MED ORDER — MIDODRINE HCL 5 MG PO TABS: 10.0000 mg | ORAL_TABLET | ORAL | Status: DC

## 2022-09-06 MED ORDER — NORTRIPTYLINE HCL 25 MG PO CAPS
25.0000 mg | ORAL_CAPSULE | Freq: Every day | ORAL | Status: DC
  Administered 2022-09-07 – 2022-09-08 (×2): 25 mg via ORAL
  Filled 2022-09-06 (×6): qty 1

## 2022-09-06 MED ORDER — ATORVASTATIN CALCIUM 40 MG PO TABS
80.0000 mg | ORAL_TABLET | Freq: Every day | ORAL | Status: DC
  Administered 2022-09-07 – 2022-09-08 (×2): 80 mg via ORAL
  Filled 2022-09-06 (×3): qty 2

## 2022-09-06 MED ORDER — ALTEPLASE 2 MG IJ SOLR: 2.0000 mg | Freq: Once | INTRAMUSCULAR | Status: DC | PRN

## 2022-09-06 MED ORDER — TOBRAMYCIN 0.3 % OP SOLN
1.0000 [drp] | Freq: Four times a day (QID) | OPHTHALMIC | Status: DC
  Administered 2022-09-06 – 2022-09-09 (×12): 1 [drp] via OPHTHALMIC
  Filled 2022-09-06: qty 5

## 2022-09-06 MED ORDER — PHENYLEPHRINE HCL-NACL 20-0.9 MG/250ML-% IV SOLN
25.0000 ug/min | INTRAVENOUS | Status: DC
  Administered 2022-09-07: 25 ug/min via INTRAVENOUS
  Filled 2022-09-06 (×3): qty 250

## 2022-09-06 MED ORDER — SODIUM CHLORIDE 0.9 % IV SOLN
1.0000 g | INTRAVENOUS | Status: DC
  Administered 2022-09-06 – 2022-09-08 (×3): 1 g via INTRAVENOUS
  Filled 2022-09-06 (×3): qty 10

## 2022-09-06 MED ORDER — LANTHANUM CARBONATE 500 MG PO CHEW
1000.0000 mg | CHEWABLE_TABLET | Freq: Three times a day (TID) | ORAL | Status: DC
  Administered 2022-09-06 – 2022-09-09 (×9): 1000 mg via ORAL
  Filled 2022-09-06 (×19): qty 2

## 2022-09-06 MED ORDER — AMIODARONE HCL IN DEXTROSE 360-4.14 MG/200ML-% IV SOLN
30.0000 mg/h | INTRAVENOUS | Status: DC
  Administered 2022-09-07 – 2022-09-08 (×2): 30 mg/h via INTRAVENOUS
  Filled 2022-09-06 (×2): qty 200

## 2022-09-06 MED ORDER — CALCITRIOL 0.25 MCG PO CAPS
0.5000 ug | ORAL_CAPSULE | ORAL | Status: DC
  Administered 2022-09-06 – 2022-09-08 (×2): 0.5 ug via ORAL
  Filled 2022-09-06 (×2): qty 2

## 2022-09-06 MED ORDER — ERYTHROMYCIN 5 MG/GM OP OINT
1.0000 | TOPICAL_OINTMENT | Freq: Every day | OPHTHALMIC | Status: DC
  Administered 2022-09-07 – 2022-09-08 (×3): 1 via OPHTHALMIC
  Filled 2022-09-06: qty 3.5

## 2022-09-06 MED ORDER — CINACALCET HCL 30 MG PO TABS: 30.0000 mg | ORAL_TABLET | ORAL | Status: DC

## 2022-09-06 MED ORDER — AZELASTINE HCL 0.1 % NA SOLN: 2.0000 | Freq: Three times a day (TID) | NASAL | Status: DC | PRN

## 2022-09-06 MED ORDER — OLOPATADINE HCL 0.1 % OP SOLN: 1.0000 [drp] | Freq: Two times a day (BID) | OPHTHALMIC | Status: DC | PRN

## 2022-09-06 MED ORDER — APIXABAN 5 MG PO TABS
5.0000 mg | ORAL_TABLET | Freq: Two times a day (BID) | ORAL | Status: DC
  Administered 2022-09-06 – 2022-09-09 (×6): 5 mg via ORAL
  Filled 2022-09-06 (×7): qty 1

## 2022-09-06 MED ORDER — AZITHROMYCIN 250 MG PO TABS
500.0000 mg | ORAL_TABLET | Freq: Every day | ORAL | Status: DC
  Administered 2022-09-07 – 2022-09-09 (×3): 500 mg via ORAL
  Filled 2022-09-06 (×4): qty 2

## 2022-09-06 MED ORDER — IPRATROPIUM-ALBUTEROL 0.5-2.5 (3) MG/3ML IN SOLN
3.0000 mL | RESPIRATORY_TRACT | Status: DC | PRN
  Administered 2022-09-06: 3 mL via RESPIRATORY_TRACT
  Filled 2022-09-06: qty 6

## 2022-09-06 MED ORDER — SODIUM CHLORIDE 0.9 % IV SOLN: 100.0000 mg | Freq: Every day | INTRAVENOUS | Status: DC

## 2022-09-06 MED ORDER — LANTHANUM CARBONATE 500 MG PO CHEW
500.0000 mg | CHEWABLE_TABLET | ORAL | Status: DC
  Administered 2022-09-06 – 2022-09-09 (×4): 500 mg via ORAL
  Filled 2022-09-06 (×19): qty 1

## 2022-09-06 MED ORDER — CHLORHEXIDINE GLUCONATE CLOTH 2 % EX PADS
6.0000 | MEDICATED_PAD | Freq: Every day | CUTANEOUS | Status: DC
  Administered 2022-09-06 – 2022-09-09 (×4): 6 via TOPICAL

## 2022-09-06 MED ORDER — BENZONATATE 100 MG PO CAPS: 100.0000 mg | ORAL_CAPSULE | Freq: Three times a day (TID) | ORAL | Status: DC | PRN

## 2022-09-06 MED ORDER — ALBUMIN HUMAN 25 % IV SOLN
INTRAVENOUS | Status: AC
  Filled 2022-09-06: qty 100

## 2022-09-06 MED ORDER — ONDANSETRON HCL 4 MG/2ML IJ SOLN: 4.0000 mg | Freq: Four times a day (QID) | INTRAMUSCULAR | Status: DC | PRN

## 2022-09-06 MED ORDER — SODIUM CHLORIDE 0.9 % IV SOLN: 100.0000 mg | INTRAVENOUS | Status: AC

## 2022-09-06 MED ORDER — SODIUM CHLORIDE 0.9 % IV SOLN
100.0000 mg | INTRAVENOUS | Status: AC
  Administered 2022-09-06 (×2): 100 mg via INTRAVENOUS
  Filled 2022-09-06 (×2): qty 20

## 2022-09-06 MED ORDER — INSULIN ASPART 100 UNIT/ML IJ SOLN
0.0000 [IU] | Freq: Every day | INTRAMUSCULAR | Status: DC
  Administered 2022-09-07 – 2022-09-08 (×3): 3 [IU] via SUBCUTANEOUS

## 2022-09-06 MED ORDER — METHYLPREDNISOLONE SODIUM SUCC 125 MG IJ SOLR
60.0000 mg | Freq: Two times a day (BID) | INTRAMUSCULAR | Status: DC
  Administered 2022-09-06 – 2022-09-09 (×6): 60 mg via INTRAVENOUS
  Filled 2022-09-06 (×7): qty 2

## 2022-09-06 MED ORDER — GABAPENTIN 300 MG PO CAPS
600.0000 mg | ORAL_CAPSULE | Freq: Two times a day (BID) | ORAL | Status: DC
  Administered 2022-09-06 – 2022-09-09 (×6): 600 mg via ORAL
  Filled 2022-09-06 (×7): qty 2

## 2022-09-06 MED ORDER — SODIUM CHLORIDE 0.9 % IV SOLN: 200.0000 mg | Freq: Once | INTRAVENOUS | Status: DC

## 2022-09-06 MED ORDER — HEPARIN SODIUM (PORCINE) 1000 UNIT/ML DIALYSIS
1000.0000 [IU] | INTRAMUSCULAR | Status: DC | PRN
  Administered 2022-09-07 – 2022-09-08 (×2): 3800 [IU]

## 2022-09-06 MED ORDER — SODIUM CHLORIDE 0.9 % IV SOLN: 250.0000 mL | INTRAVENOUS | Status: DC

## 2022-09-06 MED ORDER — FENOFIBRATE 160 MG PO TABS
160.0000 mg | ORAL_TABLET | Freq: Every day | ORAL | Status: DC
  Administered 2022-09-06 – 2022-09-09 (×4): 160 mg via ORAL
  Filled 2022-09-06 (×4): qty 1

## 2022-09-06 MED ORDER — MIDODRINE HCL 5 MG PO TABS
10.0000 mg | ORAL_TABLET | ORAL | Status: DC
  Administered 2022-09-08: 10 mg via ORAL
  Filled 2022-09-06 (×2): qty 2

## 2022-09-06 MED ORDER — CINACALCET HCL 30 MG PO TABS
60.0000 mg | ORAL_TABLET | ORAL | Status: DC
  Administered 2022-09-06 – 2022-09-08 (×2): 60 mg via ORAL
  Filled 2022-09-06 (×3): qty 2

## 2022-09-06 NOTE — ED Provider Notes (Signed)
[place] EMERGENCY DEPARTMENT AT [facility] Provider Note   CSN: [id number] Arrival date & time: 09/06/22  0046     History  Chief Complaint  Patient presents with   Shortness of Breath    Anthony Bullock is a 62 y.o. male.  62 yo M here with dyspnea, cough and feeling unwell. Dialysis patient. T/Th/Sa, hasn't missed any.  Night here with dyspnea and dry cough.  No fevers.  No productive cough.  No significant lower extremity edema.  No sick contacts.   Shortness of Breath      Home Medications Prior to Admission medications   Medication Sig Start Date End Date Taking? Authorizing Provider  acetaminophen (TYLENOL) 325 MG tablet Take 1-2 tablets (325-650 mg total) by mouth every 4 (four) hours as needed for mild pain. 11/29/19   Love, Evlyn Kanner, PA-C  apixaban (ELIQUIS) 5 MG TABS tablet Take 1 tablet (5 mg total) by mouth 2 (two) times daily. 08/26/22   Rodolph Bong, MD  atorvastatin (LIPITOR) 80 MG tablet TAKE 1 TABLET BY MOUTH AT BEDTIME 06/24/22   Burchette, Elberta Fortis, MD  azelastine (ASTELIN) 0.1 % nasal spray Place 2 sprays into both nostrils 3 (three) times daily as needed for rhinitis. Use in each nostril as directed 03/10/14   Burchette, Elberta Fortis, MD  benzonatate (TESSALON PERLES) 100 MG capsule Take 1 capsule (100 mg total) by mouth 3 (three) times daily as needed for cough. 09/02/22   Burchette, Elberta Fortis, MD  CALCITRIOL PO Take by mouth 3 (three) times a week. ONLY PROVIDED AT DIALYSIS    [provider]  carvedilol (COREG) 12.5 MG tablet Take 1 tablet (12.5 mg total) by mouth 2 (two) times daily. 08/26/22   Rodolph Bong, MD  cinacalcet (SENSIPAR) 30 MG tablet Take 1 tablet (30 mg total) by mouth Every Tuesday,Thursday,and Saturday with dialysis. 11/30/19   Love, Evlyn Kanner, PA-C  Continuous Glucose Receiver (FREESTYLE LIBRE 3 READER) DEVI Use to check blood glucose TID 08/11/22   Burchette, Elberta Fortis, MD  Continuous Glucose Sensor (FREESTYLE LIBRE 14 DAY  SENSOR) MISC USE AS DIRECTED EVERY 14 DAYS 05/11/22   Burchette, Elberta Fortis, MD  Continuous Glucose Sensor (FREESTYLE LIBRE 3 SENSOR) MISC Place 1 sensor on the skin every 14 days. Use to check glucose continuously 07/25/22   Burchette, Elberta Fortis, MD  erythromycin ophthalmic ointment Place 1 Application into the right eye at bedtime. 08/02/22   [provider]  fenofibrate 160 MG tablet TAKE 1 TABLET BY MOUTH ONCE DAILY 06/24/22   Burchette, Elberta Fortis, MD  fexofenadine (ALLEGRA) 180 MG tablet Take 180 mg by mouth daily as needed for allergies.    [provider]  gabapentin (NEURONTIN) 300 MG capsule TAKE 2 CAPSULES BY MOUTH TWICE DAILY . 06/24/22   Burchette, Elberta Fortis, MD  glucose blood test strip Check 1 time daily. E11.9 One Touch Ultra Blue Test Strips 03/10/14   Burchette, Elberta Fortis, MD  HUMALOG KWIKPEN 200 UNIT/ML KwikPen INJECT A MAXIMUM OF 28 UNITS SUBCUTANEOUSLY TWICE DAILY WITH LUNCH AND SUPPER PER SLIDING SCALE. APPOINTMENT REQUIRED FOR FUTURE REFILLS 08/15/22   Burchette, Elberta Fortis, MD  Insulin Pen Needle (BD PEN NEEDLE NANO U/F) 32G X 4 MM MISC USE 1 PEN NEEDLE SUBCUTANEOUSLY WITH INSULIN 4 TIMES DAILY 12/04/19   Burchette, Elberta Fortis, MD  lanthanum (FOSRENOL) 500 MG chewable tablet Chew 500-1,000 mg by mouth See admin instructions. Take 1000 mg with meals three time a day and  500 mg with snacks    [provider]  LANTUS SOLOSTAR 100 UNIT/ML Solostar Pen INJECT 12 UNITS SUBCUTANEOUSLY AT BEDTIME 07/15/22   Burchette, Elberta Fortis, MD  midodrine (PROAMATINE) 10 MG tablet Take 10 mg by mouth 3 (three) times a week. 08/04/20   [provider]  multivitamin (RENA-VIT) TABS tablet Take 1 tablet by mouth once daily 07/06/21   Burchette, Elberta Fortis, MD  nortriptyline (PAMELOR) 25 MG capsule Take 1 capsule (25 mg total) by mouth at bedtime. 08/09/22   Sater, Pearletha Furl, MD  Olopatadine HCl 0.2 % SOLN Place 1 drop into both eyes daily as needed (for allergies).     [provider]   tobramycin (TOBREX) 0.3 % ophthalmic solution Place 1 drop into the right eye every 6 (six) hours. 08/02/22   [provider]  VENTOLIN HFA 108 (90 Base) MCG/ACT inhaler Inhale 1-2 puffs into the lungs every 6 (six) hours as needed for wheezing or shortness of breath. 07/25/22   Burchette, Elberta Fortis, MD      Allergies    Epoetin alfa, Ferumoxytol, and Morphine sulfate    Review of Systems   Review of Systems  Respiratory:  Positive for shortness of breath.     Physical Exam Updated Vital Signs BP 122/80   Pulse 89   Temp 98.6 F (37 C) (Oral)   Resp 20   Ht 5\' 11"  (1.803 m)   Wt 96.9 kg   SpO2 95%   BMI 29.79 kg/m  Physical Exam Vitals and nursing note reviewed.  Constitutional:      Appearance: He is well-developed.  HENT:     Head: Normocephalic and atraumatic.  Cardiovascular:     Rate and Rhythm: Normal rate.  Pulmonary:     Effort: Pulmonary effort is normal. Tachypnea present. No respiratory distress.     Breath sounds: Wheezing present.     Comments: Mild bilateral wheezing Abdominal:     General: There is no distension.  Musculoskeletal:        General: Normal range of motion.     Cervical back: Normal range of motion.  Skin:    General: Skin is warm and dry.  Neurological:     Mental Status: He is alert.     ED Results / Procedures / Treatments   Labs (all labs ordered are listed, but only abnormal results are displayed) Labs Reviewed  SARS CORONAVIRUS 2 BY RT PCR - Abnormal; Notable for the following components:      Result Value   SARS Coronavirus 2 by RT PCR POSITIVE (*)    All other components within normal limits  CBC WITH DIFFERENTIAL/PLATELET - Abnormal; Notable for the following components:   WBC 14.0 (*)    RBC 4.20 (*)    Hemoglobin 12.5 (*)    HCT 38.7 (*)    RDW 16.4 (*)    Neutro Abs 11.2 (*)    Monocytes Absolute 1.1 (*)    All other components within normal limits  COMPREHENSIVE METABOLIC PANEL - Abnormal; Notable for the  following components:   Sodium 133 (*)    Chloride 93 (*)    Glucose, Bld 140 (*)    BUN 49 (*)    Creatinine, Ser 10.06 (*)    Calcium 8.7 (*)    Albumin 3.2 (*)    GFR, Estimated 5 (*)    Anion gap 16 (*)    All other components within normal limits    EKG None  Radiology  DG Chest Portable 1 View  Result Date: 09/06/2022 CLINICAL DATA:  Shortness of breath EXAM: PORTABLE CHEST 1 VIEW COMPARISON:  08/25/2022 FINDINGS: Defibrillator is again noted. Cardiac shadow is stable. Dialysis catheter is seen in satisfactory position. The lungs are well aerated. No focal infiltrate is noted. Mild central vascular congestion is seen. No effusions are noted. IMPRESSION: Mild vascular congestion stable from the prior study. Electronically Signed   By: Alcide Clever M.D.   On: 09/06/2022 03:05    Procedures .Critical Care  Performed by: Marily Memos, MD Authorized by: Marily Memos, MD   Critical care provider statement:    Critical care time (minutes):  30   Critical care was necessary to treat or prevent imminent or life-threatening deterioration of the following conditions:  Respiratory failure   Critical care was time spent personally by me on the following activities:  Development of treatment plan with patient or surrogate, discussions with consultants, evaluation of patient's response to treatment, examination of patient, ordering and review of laboratory studies, ordering and review of radiographic studies, ordering and performing treatments and interventions, pulse oximetry, re-evaluation of patient's condition and review of old charts     Medications Ordered in ED Medications  ipratropium-albuterol (DUONEB) 0.5-2.5 (3) MG/3ML nebulizer solution 3 mL (has no administration in time range)  remdesivir 100 mg in sodium chloride 0.9 % 100 mL IVPB (has no administration in time range)    Followed by  remdesivir 100 mg in sodium chloride 0.9 % 100 mL IVPB (has no administration in time  range)  dexamethasone (DECADRON) injection 6 mg (6 mg Intravenous Given 09/06/22 0349)    ED Course/ Medical Decision Making/ A&P                                 Medical Decision Making Amount and/or Complexity of Data Reviewed Labs: ordered. Radiology: ordered.  Risk Prescription drug management. Decision regarding hospitalization.   Patient is due for dialysis today and does have some interstitial findings on his x-ray however also has COVID and COPD and hypoxia with wheezing.  It is unclear whether it is the COVID this causing his symptoms or if it is a COPD exacerbation or just pulmonary edema.  Fact the patient is not really amenable to dialysis and discharge so discussed with Dr. Thomes Dinning for admission pending dialysis and reevaluation.   Final Clinical Impression(s) / ED Diagnoses Final diagnoses:  Hypoxia  COVID  ESRD (end stage renal disease) (HCC)    Rx / DC Orders ED Discharge Orders     None         Devonda Pequignot, Barbara Cower, MD 09/06/22 [phone]

## 2022-09-06 NOTE — Progress Notes (Incomplete)
Rapid response Rapid response was called due to patient having increased work of breathing.  Apparently, patient notified RN of respiratory difficulty despite having O2 sat of 97% on 2 L via nasal cannula.  Respiratory therapist was called, nebulizer treatment and HFNC given was ineffective.  BiPAP and ABG were ordered at bedside and patient was transferred to ICU for closer monitoring.  Patient was scheduled to have hemodialysis tonight. Chest x-ray done showed worsening CHF.    BP (!) 144/49 (BP Location: Right Arm)   Pulse (!) 110   Temp 97.9 F (36.6 C) (Oral)   Resp 20   Ht 5\' 11"  (1.803 m)   Wt 93.3 kg   SpO2 98%   BMI 28.69 kg/m   ABG    Component Value Date/Time   PHART 7.27 (L) 09/06/2022 2045   PCO2ART 54 (H) 09/06/2022 2045   PO2ART 59 (L) 09/06/2022 2045   HCO3 24.8 09/06/2022 2045   TCO2 26 08/05/2022 1806   ACIDBASEDEF 2.9 (H) 09/06/2022 2045   O2SAT 88.9 09/06/2022 2045   ABG    Component Value Date/Time   PHART 7.41 09/07/2022 0200   PCO2ART 40 09/07/2022 0200   PO2ART 49 (L) 09/07/2022 0200   HCO3 25.4 09/07/2022 0200   TCO2 26 08/05/2022 1806   ACIDBASEDEF 2.9 (H) 09/06/2022 2045   O2SAT 83.4 09/07/2022 0200    Patient was placed on BIPAP. Hemodialysis was started, he was tachycardic in the 130s. He has a History of A-Fib. Amiodarone drip was started.  BP became soft during dialysis -86/27 with a MAP of 44.  Neo-Synephrine drip was started peripherally and 2.5 L of fluid was drawn off patient. Repeated ABG was as shown above Chest x-ray s/p dialysis showed stable CHF ABG done this morning showed some improvement.  Patient will continue to be left on BiPAP at this time, with plan to wean him off BiPAP as tolerated during the day.  ABG    Component Value Date/Time   PHART 7.44 09/07/2022 0622   PCO2ART 40 09/07/2022 0622   PO2ART 144 (H) 09/07/2022 0622   HCO3 27.2 09/07/2022 0622   TCO2 26 08/05/2022 1806   ACIDBASEDEF 2.9 (H) 09/06/2022 2045    O2SAT 100 09/07/2022 0622        Critical time: 57 minutes   Critical care personally provided  managing the patient due to high probability of clinically significant and life threatening deterioration. This critical care time included obtaining a history; examining the patient, pulse oximetry; ordering and review of studies; arranging urgent treatment with development of a management plan; evaluation of patient's response of treatment; frequent reassessment; and discussions with other providers.  This critical care time was performed to assess and manage the high probability of imminent and life threatening deterioration that could result in multi-organ failure.

## 2022-09-06 NOTE — ED Triage Notes (Addendum)
Pt bib [facility] from home c/o SOB that began around 2200, states he fluid overloaded. Pt is on dialysis- Sat, Tues, Thurs. Pt is 89% on RA.

## 2022-09-06 NOTE — ED Notes (Signed)
Pt placed on 2L [place]

## 2022-09-06 NOTE — Plan of Care (Signed)
  Problem: Education: Goal: Knowledge of risk factors and measures for prevention of condition will improve Outcome: Progressing   

## 2022-09-06 NOTE — ED Notes (Signed)
Messaged DO, Adefeso, pertaining to placing a diet order for this pt/specific diet as pt is ESRD

## 2022-09-06 NOTE — Consult Note (Signed)
ESRD Consult Note  Assessment/Recommendations:  ESRD  -outpatient HD orders: [facility], TTS.  EDW 91.5 kg.  TDC.  Flow rates: 400/500.  1K/2.5 calcium.  No heparin (only receives heparin locks).  Meds: Sensipar to 60 mg q. treatment, calcitriol 0.5 mcg q. treatment, Mircera 30 mcg every 4 weeks (has not started this yet). -HD today on TTS schedule  SOB COVID+ -per primary -UF as tolerated given vascular congestion on CXR  Volume/ hypertension  -UF as tolerated with HD  Anemia of Chronic Kidney Disease -Hemoglobin 12.5.  -Transfuse PRN for Hgb <7  Secondary Hyperparathyroidism/Hyperphosphatemia - will resume calcitriol and sensipar -resume home binders if on any   Recommendations were discussed with the primary team.  Anthony Sar, MD [facility]  History of Present Illness: Anthony Bullock is a/an 62 y.o. male with a past medical history of ESRD, hypertension, DM 2, diabetic neuropathy, bilateral BKA, CHF, anxiety/depression, hyperlipidemia who presents with shortness of breath, found to have COVID here.  Was recently admitted for acute hypoxic respiratory failure secondary to volume overload, had new onset A-fib as well and found to have an EF of 30 to 35%. Patient seen and examined bedside. He does report that he has some abdominal distension which happens he has fluid on board but otherwise reports feeling well. Denies any issues with dialysis or with his catheter. Denies any chest pain, worsening SOB.   Medications:  Current Facility-Administered Medications  Medication Dose Route Frequency Provider Last Rate Last Admin   ipratropium-albuterol (DUONEB) 0.5-2.5 (3) MG/3ML nebulizer solution 3 mL  3 mL Nebulization Q2H PRN Mesner, Barbara Cower, MD       remdesivir 100 mg in sodium chloride 0.9 % 100 mL IVPB  100 mg Intravenous Q30 min Mesner, Barbara Cower, MD       Followed by   Melene Muller ON 09/07/2022] remdesivir 100 mg in sodium chloride 0.9 % 100 mL IVPB  100 mg Intravenous  Daily Mesner, Barbara Cower, MD       Current Outpatient Medications  Medication Sig Dispense Refill   acetaminophen (TYLENOL) 325 MG tablet Take 1-2 tablets (325-650 mg total) by mouth every 4 (four) hours as needed for mild pain.     apixaban (ELIQUIS) 5 MG TABS tablet Take 1 tablet (5 mg total) by mouth 2 (two) times daily. 60 tablet 2   atorvastatin (LIPITOR) 80 MG tablet TAKE 1 TABLET BY MOUTH AT BEDTIME 90 tablet 3   azelastine (ASTELIN) 0.1 % nasal spray Place 2 sprays into both nostrils 3 (three) times daily as needed for rhinitis. Use in each nostril as directed 30 mL 12   benzonatate (TESSALON PERLES) 100 MG capsule Take 1 capsule (100 mg total) by mouth 3 (three) times daily as needed for cough. 30 capsule 0   CALCITRIOL PO Take by mouth 3 (three) times a week. ONLY PROVIDED AT DIALYSIS     carvedilol (COREG) 12.5 MG tablet Take 1 tablet (12.5 mg total) by mouth 2 (two) times daily. 180 tablet 0   cinacalcet (SENSIPAR) 30 MG tablet Take 1 tablet (30 mg total) by mouth Every Tuesday,Thursday,and Saturday with dialysis. 60 tablet    Continuous Glucose Receiver (FREESTYLE LIBRE 3 READER) DEVI Use to check blood glucose TID 1 each 0   Continuous Glucose Sensor (FREESTYLE LIBRE 14 DAY SENSOR) MISC USE AS DIRECTED EVERY 14 DAYS 6 each 3   Continuous Glucose Sensor (FREESTYLE LIBRE 3 SENSOR) MISC Place 1 sensor on the skin every 14 days. Use to check glucose  continuously 2 each 11   erythromycin ophthalmic ointment Place 1 Application into the right eye at bedtime.     fenofibrate 160 MG tablet TAKE 1 TABLET BY MOUTH ONCE DAILY 90 tablet 3   fexofenadine (ALLEGRA) 180 MG tablet Take 180 mg by mouth daily as needed for allergies.     gabapentin (NEURONTIN) 300 MG capsule TAKE 2 CAPSULES BY MOUTH TWICE DAILY . 360 capsule 3   glucose blood test strip Check 1 time daily. E11.9 One Touch Ultra Blue Test Strips 100 each 3   HUMALOG KWIKPEN 200 UNIT/ML KwikPen INJECT A MAXIMUM OF 28 UNITS SUBCUTANEOUSLY  TWICE DAILY WITH LUNCH AND SUPPER PER SLIDING SCALE. APPOINTMENT REQUIRED FOR FUTURE REFILLS 6 mL 2   Insulin Pen Needle (BD PEN NEEDLE NANO U/F) 32G X 4 MM MISC USE 1 PEN NEEDLE SUBCUTANEOUSLY WITH INSULIN 4 TIMES DAILY 400 each 0   lanthanum (FOSRENOL) 500 MG chewable tablet Chew 500-1,000 mg by mouth See admin instructions. Take 1000 mg with meals three time a day and 500 mg with snacks     LANTUS SOLOSTAR 100 UNIT/ML Solostar Pen INJECT 12 UNITS SUBCUTANEOUSLY AT BEDTIME 15 mL 0   midodrine (PROAMATINE) 10 MG tablet Take 10 mg by mouth 3 (three) times a week.     multivitamin (RENA-VIT) TABS tablet Take 1 tablet by mouth once daily 10 tablet 3   nortriptyline (PAMELOR) 25 MG capsule Take 1 capsule (25 mg total) by mouth at bedtime. 30 capsule 3   Olopatadine HCl 0.2 % SOLN Place 1 drop into both eyes daily as needed (for allergies).      tobramycin (TOBREX) 0.3 % ophthalmic solution Place 1 drop into the right eye every 6 (six) hours.     VENTOLIN HFA 108 (90 Base) MCG/ACT inhaler Inhale 1-2 puffs into the lungs every 6 (six) hours as needed for wheezing or shortness of breath. 18 g 1     ALLERGIES Epoetin alfa, Ferumoxytol, and Morphine sulfate  MEDICAL HISTORY Past Medical History:  Diagnosis Date   AICD (automatic cardioverter/defibrillator) present    [employer]   Allergic rhinitis    Anemia    Arthritis    Chronic systolic heart failure (HCC)    a. ECHO (12/2012) EF 25-30%, HK entireanteroseptal myocardium //  b.  EF 25%, diffuse HK, grade 1 diastolic dysfunction, MAC, mild LAE, normal RVSF, trivial pericardial effusion   COPD (chronic obstructive pulmonary disease) (HCC)    Diabetes mellitus type II    Diabetic nephropathy (HCC)    Diabetic neuropathy (HCC)    ESRD on hemodialysis (HCC)    started HD June 2017, goes to [facility] HD unit, Dr Fausto Skillern   History of cardiac catheterization    a.Myoview 1/15:  There is significant left ventricular dysfunction. There may  be slight scar at the apex. There is no significant ischemia. LV Ejection Fraction: 27%  //  b. RHC/LHC (1/15) with mean RA 6, PA 47/22 mean 33, mean PCWP 20, PVR 2.5 WU, CI 2.5; 80% dLAD stenosis, 70% diffuse large D.     History of kidney stones    Hyperlipidemia    Hypertension    Kidney stones    NICM (nonischemic cardiomyopathy) (HCC)    Primarily nonischemic.  Echo (12/14) with EF 25-30%.  Echo (3/15) with EF 25%, mild to moderately dilated LV, normal RV size and systolic function.     Osteomyelitis (HCC)    left fifth ray   Pneumonia    Urethral  stricture    Wears glasses      SOCIAL HISTORY Social History   Socioeconomic History   Marital status: Married    Spouse name: Not on file   Number of children: 0   Years of education: Not on file   Highest education level: Not on file  Occupational History   Not on file  Tobacco Use   Smoking status: Former    Current packs/day: 0.00    Average packs/day: 2.0 packs/day for 32.0 years (64.0 ttl pk-yrs)    Types: Cigarettes    Start date: 05/11/1978    Quit date: 05/11/2010    Years since quitting: 12.3   Smokeless tobacco: Never  Vaping Use   Vaping status: Never Used  Substance and Sexual Activity   Alcohol use: No   Drug use: No   Sexual activity: Yes  Other Topics Concern   Not on file  Social History Narrative   Works at [employer] as a Therapist, music      Right handed   Wears glasses    Drinks 10 oz coffee daily   Drinks 2 sodas per day   Social Determinants of Health   Financial Resource Strain: Low Risk  (01/24/2022)   Overall Financial Resource Strain (CARDIA)    Difficulty of Paying Living Expenses: Not hard at all  Food Insecurity: No Food Insecurity (08/29/2022)   Hunger Vital Sign    Worried About Running Out of Food in the Last Year: Never true    Ran Out of Food in the Last Year: Never true  Transportation Needs: No Transportation Needs (08/29/2022)   PRAPARE - Administrator, Civil Service  (Medical): No    Lack of Transportation (Non-Medical): No  Physical Activity: Inactive (01/24/2022)   Exercise Vital Sign    Days of Exercise per Week: 0 days    Minutes of Exercise per Session: 0 min  Stress: No Stress Concern Present (01/24/2022)   [employer] of Occupational Health - Occupational Stress Questionnaire    Feeling of Stress : Not at all  Social Connections: Socially Integrated (01/24/2022)   Social Connection and Isolation Panel [NHANES]    Frequency of Communication with Friends and Family: More than three times a week    Frequency of Social Gatherings with Friends and Family: More than three times a week    Attends Religious Services: More than 4 times per year    Active Member of [employer] or Organizations: Yes    Attends Engineer, structural: More than 4 times per year    Marital Status: Married  Catering manager Violence: Not At Risk (08/21/2022)   Humiliation, Afraid, Rape, and Kick questionnaire    Fear of Current or Ex-Partner: No    Emotionally Abused: No    Physically Abused: No    Sexually Abused: No     FAMILY HISTORY Family History  Problem Relation Age of Onset   Bladder Cancer Mother    Alcohol abuse Father    Melanoma Father    Stroke Maternal Grandmother    Heart Problems Maternal Grandmother        unknown   Diabetes Maternal Grandmother    Heart disease Maternal Grandfather    Prostate cancer Maternal Grandfather      Review of Systems: 12 systems were reviewed and negative except per HPI  Physical Exam: Vitals:   09/06/22 0645 09/06/22 0700  BP: 108/67 92/69  Pulse: 92 89  Resp:  19  Temp:  98.8 F (37.1 C)  SpO2: 95% 96%   No intake/output data recorded. No intake or output data in the 24 hours ending 09/06/22 0805 General: well-appearing, no acute distress HEENT: anicteric sclera, MMM CV: normal rate, no murmurs Lungs: bilateral chest rise, normal wob Abd: soft, non-tender, slightly distended Skin: no visible  lesions or rashes Msk: no sig stump edema bilaterally Neuro: normal speech, no gross focal deficits  Dialysis access: [facility]  Test Results Reviewed Lab Results  Component Value Date   NA 133 (L) 09/06/2022   K 3.5 09/06/2022   CL 93 (L) 09/06/2022   CO2 24 09/06/2022   BUN 49 (H) 09/06/2022   CREATININE 10.06 (H) 09/06/2022   GFR 7.22 (LL) 09/02/2022   GLU 351 11/07/2017   CALCIUM 8.7 (L) 09/06/2022   ALBUMIN 3.2 (L) 09/06/2022   PHOS 3.1 08/26/2022    I have reviewed relevant outside healthcare records

## 2022-09-06 NOTE — Procedures (Incomplete)
   HEMODIALYSIS TREATMENT NOTE:  Prior to treatment pt was transferred from 3A to step-down unit for worsening hypoxia.  Now requiring Bi-PAP.  127/71, HR 133 Afib.  HD was initiated with 3L goal.  Tolerated UF for 45 minutes before BP began dropping.  Albumin is contraindicated for Epoetin allergy.  Unable to remove BiPAP mask to give Midodrine.  Dr. Thomes Dinning assessed pt. Goal was lowered to 1.5 liters after which HR slowed to 110-120 and SBPs remained 100-118.

## 2022-09-06 NOTE — Plan of Care (Signed)
  Problem: Respiratory: Goal: Will maintain a patent airway Outcome: Not Progressing Goal: Complications related to the disease process, condition or treatment will be avoided or minimized Outcome: Not Progressing   Problem: Activity: Goal: Risk for activity intolerance will decrease Outcome: Not Progressing   Problem: Nutrition: Goal: Adequate nutrition will be maintained Outcome: Not Progressing   Problem: Elimination: Goal: Will not experience complications related to bowel motility Outcome: Not Progressing

## 2022-09-06 NOTE — Progress Notes (Signed)
Patient called to nurses station stating he was SOB, upon entering room SPO2 at 97% on 2L per nasal cannula. Patient stating he can not catch his breath and that he needed a CPAP, respiratory therapy called and came to bedside, neb treatments and HFNC ineffective, rapid response called. Dr. Thomes Dinning placed new orders for bipap and ABG, patient placed on bipap per respiratory therapy. Patient transferred down to ICU, report given to [facility] Rose,RN.

## 2022-09-06 NOTE — Progress Notes (Signed)
Pt.transported to room 322 with no complications. RN/CNA aware pt.in room.        Pt.left in bed, bed locked and in lowest position. Call bell within reach. Oxygen hooked up and on.  Pt.verified having all belongings with him.

## 2022-09-06 NOTE — Hospital Course (Signed)
62 year old male with a history of hypertension, hyperlipidemia, diabetes mellitus type 2, bilateral BKA, ESRD (TTS),HFrEF (EF 30-35%), anxiety/depression presenting with cough and shortness of breath that had worsened in the last 24 hours prior to admission.  Notably, the patient had a recent hospital admission from 08/18/2022 to 08/26/2022 for acute respiratory failure secondary to fluid overload.  The patient's fluid status was managed with hemodialysis.  He required extra hemodialysis sessions.  His hospitalization was prolonged secondary to new onset A-fib and new diagnosis of cardiomyopathy with EF 30-35%.  Cardiology was consulted.  He was on a diltiazem drip temporarily.  He converted to sinus rhythm.  His GDMT was limited secondary to intradialytic low blood pressures.  He was discharged home with carvedilol and apixaban. The patient has had a history of dietary indiscretion which led to his fluid overload.  However, he states that he has been compliant with his diet and fluid restriction since discharge from his last hospitalization.  He denies any fevers, chills, chest pain, headache, neck pain, hemoptysis.  He has a nonproductive cough.  He has some upper abdominal pain with coughing.  He had 1 episode of emesis on 09/04/2022, but this has resolved.  He had 1 loose stool on 09/04/2022.  This has resolved.  There is no hematochezia or melena. Because of his worsening shortness of breath and cough, he presented for further evaluation and treatment.  In the ED, the patient was afebrile.  He was hypoxic with oxygen saturation 89% room air.  He was hemodynamically stable.  He was placed on 2 L with saturation 95%.  WBC 14.0, hemoglobin 12.5, platelets 230,000.  Sodium 133, potassium 3.5, bicarbonate 24, serum creatinine of 10.06.  AST 26, ALT 23, alk phosphatase 65, total bilirubin 0.7.  Chest x-ray showed venous congestion and chronic interstitial markings.  Nephrology was consulted to assist with  management.

## 2022-09-06 NOTE — H&P (Addendum)
History and Physical    Patient: Anthony Bullock GMW:[id number] DOB: [birth year] DOA: 09/06/2022 DOS: the patient was seen and examined on 09/06/2022 PCP: Kristian Covey, MD  Patient coming from: Home  Chief Complaint:  Chief Complaint  Patient presents with   Shortness of Breath   HPI: Anthony Bullock is a 62 year old male with a history of hypertension, hyperlipidemia, diabetes mellitus type 2, bilateral BKA, ESRD (TTS),HFrEF (EF 30-35%), anxiety/depression presenting with cough and shortness of breath that had worsened in the last 24 hours prior to admission.  Notably, the patient had a recent hospital admission from 08/18/2022 to 08/26/2022 for acute respiratory failure secondary to fluid overload.  The patient's fluid status was managed with hemodialysis.  He required extra hemodialysis sessions.  His hospitalization was prolonged secondary to new onset A-fib and new diagnosis of cardiomyopathy with EF 30-35%.  Cardiology was consulted.  He was on a diltiazem drip temporarily.  He converted to sinus rhythm.  His GDMT was limited secondary to intradialytic low blood pressures.  He was discharged home with carvedilol and apixaban. The patient has had a history of dietary indiscretion which led to his fluid overload.  However, he states that he has been compliant with his diet and fluid restriction since discharge from his last hospitalization.  He denies any fevers, chills, chest pain, headache, neck pain, hemoptysis.  He has a nonproductive cough.  He has some upper abdominal pain with coughing.  He had 1 episode of emesis on 09/04/2022, but this has resolved.  He had 1 loose stool on 09/04/2022.  This has resolved.  There is no hematochezia or melena. Because of his worsening shortness of breath and cough, he presented for further evaluation and treatment.  In the ED, the patient was afebrile.  He was hypoxic with oxygen saturation 89% room air.  He was hemodynamically stable.  He was placed on 2 L  with saturation 95%.  WBC 14.0, hemoglobin 12.5, platelets 230,000.  Sodium 133, potassium 3.5, bicarbonate 24, serum creatinine of 10.06.  AST 26, ALT 23, alk phosphatase 65, total bilirubin 0.7.  Chest x-ray showed venous congestion and chronic interstitial markings.  Nephrology was consulted to assist with management.  Review of Systems: As mentioned in the history of present illness. All other systems reviewed and are negative. Past Medical History:  Diagnosis Date   AICD (automatic cardioverter/defibrillator) present    [employer]   Allergic rhinitis    Anemia    Arthritis    Chronic systolic heart failure (HCC)    a. ECHO (12/2012) EF 25-30%, HK entireanteroseptal myocardium //  b.  EF 25%, diffuse HK, grade 1 diastolic dysfunction, MAC, mild LAE, normal RVSF, trivial pericardial effusion   COPD (chronic obstructive pulmonary disease) (HCC)    Diabetes mellitus type II    Diabetic nephropathy (HCC)    Diabetic neuropathy (HCC)    ESRD on hemodialysis (HCC)    started HD June 2017, goes to [facility] HD unit, Dr Fausto Skillern   History of cardiac catheterization    a.Myoview 1/15:  There is significant left ventricular dysfunction. There may be slight scar at the apex. There is no significant ischemia. LV Ejection Fraction: 27%  //  b. RHC/LHC (1/15) with mean RA 6, PA 47/22 mean 33, mean PCWP 20, PVR 2.5 WU, CI 2.5; 80% dLAD stenosis, 70% diffuse large D.     History of kidney stones    Hyperlipidemia    Hypertension    Kidney stones  NICM (nonischemic cardiomyopathy) (HCC)    Primarily nonischemic.  Echo (12/14) with EF 25-30%.  Echo (3/15) with EF 25%, mild to moderately dilated LV, normal RV size and systolic function.     Osteomyelitis (HCC)    left fifth ray   Pneumonia    Urethral stricture    Wears glasses    Past Surgical History:  Procedure Laterality Date   ABDOMINAL AORTOGRAM W/LOWER EXTREMITY N/A 03/30/2016   Procedure: Abdominal Aortogram w/Lower Extremity;   Surgeon: Chuck Hint, MD;  Location: [facility] INVASIVE CV LAB;  Service: Cardiovascular;  Laterality: N/A;   AMPUTATION Right 04/26/2016   Procedure: Right Below Knee Amputation;  Surgeon: Nadara Mustard, MD;  Location: [facility] OR;  Service: Orthopedics;  Laterality: Right;   AMPUTATION Left 08/21/2019   Procedure: LEFT FOOT 5TH RAY AMPUTATION;  Surgeon: Nadara Mustard, MD;  Location: [facility] OR;  Service: Orthopedics;  Laterality: Left;   AMPUTATION Left 11/13/2019   Procedure: LEFT BELOW KNEE AMPUTATION;  Surgeon: Nadara Mustard, MD;  Location: [facility] OR;  Service: Orthopedics;  Laterality: Left;   AV FISTULA PLACEMENT Right 09/08/2015   Procedure: INSERTION OF 4-32mm x 45cm  ARTERIOVENOUS (AV) GORE-TEX GRAFT RIGHT UPPER  ARM;  Surgeon: Chuck Hint, MD;  Location: [facility] OR;  Service: Vascular;  Laterality: Right;   AV FISTULA PLACEMENT Left 01/14/2016   Procedure: CREATION OF LEFT UPPER ARM ARTERIOVENOUS FISTULA;  Surgeon: Chuck Hint, MD;  Location: [facility] OR;  Service: Vascular;  Laterality: Left;   BASCILIC VEIN TRANSPOSITION Right 08/22/2014   Procedure: RIGHT UPPER ARM BASCILIC VEIN TRANSPOSITION;  Surgeon: Chuck Hint, MD;  Location: [facility] OR;  Service: Vascular;  Laterality: Right;   BELOW KNEE LEG AMPUTATION Right 04/26/2016   CARDIAC CATHETERIZATION     CARDIAC DEFIBRILLATOR PLACEMENT  06/27/2013   Sub Q       BY DR Graciela Husbands   CATARACT EXTRACTION W/PHACO Right 08/06/2018   Procedure: CATARACT EXTRACTION PHACO AND INTRAOCULAR LENS PLACEMENT (IOC);  Surgeon: Fabio Pierce, MD;  Location: [facility] ORS;  Service: Ophthalmology;  Laterality: Right;  CDE: 4.06   CATARACT EXTRACTION W/PHACO Left 08/20/2018   Procedure: CATARACT EXTRACTION PHACO AND INTRAOCULAR LENS PLACEMENT (IOC);  Surgeon: Fabio Pierce, MD;  Location: [facility] ORS;  Service: Ophthalmology;  Laterality: Left;  CDE: 6.76   COLONOSCOPY WITH PROPOFOL N/A 07/22/2015   Procedure: COLONOSCOPY WITH PROPOFOL;  Surgeon: Sherrilyn Rist, MD;   Location: [facility] ENDOSCOPY;  Service: Gastroenterology;  Laterality: N/A;   FEMORAL-POPLITEAL BYPASS GRAFT Right 03/31/2016   Procedure: BYPASS GRAFT FEMORAL-POPLITEAL ARTERY USING RIGHT GREATER SAPHENOUS NONREVERSED VEIN;  Surgeon: Chuck Hint, MD;  Location: [facility] OR;  Service: Vascular;  Laterality: Right;   HERNIA REPAIR     I & D EXTREMITY Right 03/31/2016   Procedure: IRRIGATION AND DEBRIDEMENT FOOT;  Surgeon: Chuck Hint, MD;  Location: [facility] OR;  Service: Vascular;  Laterality: Right;   IMPLANTABLE CARDIOVERTER DEFIBRILLATOR IMPLANT N/A 06/27/2013   Procedure: SUB Q ICD;  Surgeon: Duke Salvia, MD;  Location: [facility] CATH LAB;  Service: Cardiovascular;  Laterality: N/A;   INTRAOPERATIVE ARTERIOGRAM Right 03/31/2016   Procedure: INTRA OPERATIVE ARTERIOGRAM;  Surgeon: Chuck Hint, MD;  Location: [facility] OR;  Service: Vascular;  Laterality: Right;   IR GENERIC HISTORICAL Right 11/30/2015   IR THROMBECTOMY AV FISTULA W/THROMBOLYSIS/PTA INC/SHUNT/IMG RIGHT 11/30/2015 Irish Lack, MD [facility]   IR GENERIC HISTORICAL  11/30/2015   IR US GUIDE VASC ACCESS RIGHT 11/30/2015 Irish Lack,  MD [facility]   IR GENERIC HISTORICAL Right 12/15/2015   IR THROMBECTOMY AV FISTULA W/THROMBOLYSIS/PTA/STENT INC/SHUNT/IMG RT 12/15/2015 Oley Balm, MD [facility]   IR GENERIC HISTORICAL  12/15/2015   IR US GUIDE VASC ACCESS RIGHT 12/15/2015 Oley Balm, MD [facility]   IR GENERIC HISTORICAL  12/28/2015   IR FLUORO GUIDE CV LINE RIGHT 12/28/2015 Jolaine Click, MD [facility]   IR GENERIC HISTORICAL  12/28/2015   IR US GUIDE VASC ACCESS RIGHT 12/28/2015 Jolaine Click, MD [facility]   LEFT A ND RIGHT HEART CATH  01/30/2013   DR Jones Broom   LEFT AND RIGHT HEART CATHETERIZATION WITH CORONARY ANGIOGRAM N/A 01/30/2013   Procedure: LEFT AND RIGHT HEART CATHETERIZATION WITH CORONARY ANGIOGRAM;  Surgeon: Dolores Patty, MD;  Location: [facility] CATH LAB;  Service: Cardiovascular;  Laterality:  N/A;   PERIPHERAL VASCULAR CATHETERIZATION Right 01/26/2015   Procedure: A/V Fistulagram;  Surgeon: Chuck Hint, MD;  Location: [facility] INVASIVE CV LAB;  Service: Cardiovascular;  Laterality: Right;   reapea urethral surgery for recurrent obstruction  2011   TOTAL KNEE ARTHROPLASTY Right 2007   VEIN HARVEST Right 03/31/2016   Procedure: RIGHT GREATER SAPHENOUS VEIN HARVEST;  Surgeon: Chuck Hint, MD;  Location: [facility] OR;  Service: Vascular;  Laterality: Right;   Social History:  reports that he quit smoking about 12 years ago. His smoking use included cigarettes. He started smoking about 44 years ago. He has a 64 pack-year smoking history. He has never used smokeless tobacco. He reports that he does not drink alcohol and does not use drugs.  Allergies  Allergen Reactions   Epoetin Alfa Other (See Comments)    unknown   Ferumoxytol Other (See Comments)    unknown   Morphine Sulfate Rash and Other (See Comments)    Itches all over, red spots    Family History  Problem Relation Age of Onset   Bladder Cancer Mother    Alcohol abuse Father    Melanoma Father    Stroke Maternal Grandmother    Heart Problems Maternal Grandmother        unknown   Diabetes Maternal Grandmother    Heart disease Maternal Grandfather    Prostate cancer Maternal Grandfather     Prior to Admission medications   Medication Sig Start Date End Date Taking? Authorizing Provider  acetaminophen (TYLENOL) 325 MG tablet Take 1-2 tablets (325-650 mg total) by mouth every 4 (four) hours as needed for mild pain. 11/29/19   Love, Evlyn Kanner, PA-C  apixaban (ELIQUIS) 5 MG TABS tablet Take 1 tablet (5 mg total) by mouth 2 (two) times daily. 08/26/22   Rodolph Bong, MD  atorvastatin (LIPITOR) 80 MG tablet TAKE 1 TABLET BY MOUTH AT BEDTIME 06/24/22   Burchette, Elberta Fortis, MD  azelastine (ASTELIN) 0.1 % nasal spray Place 2 sprays into both nostrils 3 (three) times daily as needed for rhinitis. Use in each nostril as  directed 03/10/14   Burchette, Elberta Fortis, MD  benzonatate (TESSALON PERLES) 100 MG capsule Take 1 capsule (100 mg total) by mouth 3 (three) times daily as needed for cough. 09/02/22   Burchette, Elberta Fortis, MD  CALCITRIOL PO Take by mouth 3 (three) times a week. ONLY PROVIDED AT DIALYSIS    [provider]  carvedilol (COREG) 12.5 MG tablet Take 1 tablet (12.5 mg total) by mouth 2 (two) times daily. 08/26/22   Rodolph Bong, MD  cinacalcet (SENSIPAR) 30 MG tablet Take 1 tablet (30 mg total)  by mouth Every Tuesday,Thursday,and Saturday with dialysis. 11/30/19   Love, Evlyn Kanner, PA-C  Continuous Glucose Receiver (FREESTYLE LIBRE 3 READER) DEVI Use to check blood glucose TID 08/11/22   Burchette, Elberta Fortis, MD  Continuous Glucose Sensor (FREESTYLE LIBRE 14 DAY SENSOR) MISC USE AS DIRECTED EVERY 14 DAYS 05/11/22   Burchette, Elberta Fortis, MD  Continuous Glucose Sensor (FREESTYLE LIBRE 3 SENSOR) MISC Place 1 sensor on the skin every 14 days. Use to check glucose continuously 07/25/22   Burchette, Elberta Fortis, MD  erythromycin ophthalmic ointment Place 1 Application into the right eye at bedtime. 08/02/22   [provider]  fenofibrate 160 MG tablet TAKE 1 TABLET BY MOUTH ONCE DAILY 06/24/22   Burchette, Elberta Fortis, MD  fexofenadine (ALLEGRA) 180 MG tablet Take 180 mg by mouth daily as needed for allergies.    [provider]  gabapentin (NEURONTIN) 300 MG capsule TAKE 2 CAPSULES BY MOUTH TWICE DAILY . 06/24/22   Burchette, Elberta Fortis, MD  glucose blood test strip Check 1 time daily. E11.9 One Touch Ultra Blue Test Strips 03/10/14   Burchette, Elberta Fortis, MD  HUMALOG KWIKPEN 200 UNIT/ML KwikPen INJECT A MAXIMUM OF 28 UNITS SUBCUTANEOUSLY TWICE DAILY WITH LUNCH AND SUPPER PER SLIDING SCALE. APPOINTMENT REQUIRED FOR FUTURE REFILLS 08/15/22   Burchette, Elberta Fortis, MD  Insulin Pen Needle (BD PEN NEEDLE NANO U/F) 32G X 4 MM MISC USE 1 PEN NEEDLE SUBCUTANEOUSLY WITH INSULIN 4 TIMES DAILY 12/04/19   Burchette, Elberta Fortis, MD   lanthanum (FOSRENOL) 500 MG chewable tablet Chew 500-1,000 mg by mouth See admin instructions. Take 1000 mg with meals three time a day and 500 mg with snacks    [provider]  LANTUS SOLOSTAR 100 UNIT/ML Solostar Pen INJECT 12 UNITS SUBCUTANEOUSLY AT BEDTIME 07/15/22   Burchette, Elberta Fortis, MD  midodrine (PROAMATINE) 10 MG tablet Take 10 mg by mouth 3 (three) times a week. 08/04/20   [provider]  multivitamin (RENA-VIT) TABS tablet Take 1 tablet by mouth once daily 07/06/21   Burchette, Elberta Fortis, MD  nortriptyline (PAMELOR) 25 MG capsule Take 1 capsule (25 mg total) by mouth at bedtime. 08/09/22   Sater, Pearletha Furl, MD  Olopatadine HCl 0.2 % SOLN Place 1 drop into both eyes daily as needed (for allergies).     [provider]  tobramycin (TOBREX) 0.3 % ophthalmic solution Place 1 drop into the right eye every 6 (six) hours. 08/02/22   [provider]  VENTOLIN HFA 108 (90 Base) MCG/ACT inhaler Inhale 1-2 puffs into the lungs every 6 (six) hours as needed for wheezing or shortness of breath. 07/25/22   Kristian Covey, MD    Physical Exam: Vitals:   09/06/22 0515 09/06/22 0600 09/06/22 0645 09/06/22 0700  BP: (!) 141/45 122/80 108/67 92/69  Pulse: 86 89 92 89  Resp: 20 20  19   Temp:    98.8 F (37.1 C)  TempSrc:    Oral  SpO2: 96% 95% 95% 96%  Weight:      Height:       GENERAL:  A&O x 3, NAD, well developed, cooperative, follows commands HEENT: [place]/AT, No thrush, No icterus, No oral ulcers Neck:  No neck mass, No meningismus, soft, supple CV: RRR, no S3, no S4, no rub, no JVD Lungs: Bibasilar crackles.  No wheeze. Abd: soft/NT +BS, nondistended Ext: No edema, no lymphangitis, no cyanosis, no rashes Neuro:  CN II-XII intact, strength 4/5 in RUE, RLE, strength 4/5 LUE,  LLE; sensation intact bilateral; no dysmetria; babinski equivocal.  Bilateral BKA  Data Reviewed: Labs reviewed above.  Assessment and Plan: Acute respiratory failure with  hypoxia -Secondary to COVID-19 and a degree of fluid overload -Stable on 2 L -Wean oxygen for sats greater 92%  COVID-19 pneumonia -Continue remdesivir x 3 days -Continue Solu-Medrol -Check inflammatory markers -PCT 1.70>>start ceftriaxone and azithro -CRP 2.3  ESRD -Patient dialyzes Tuesday, Thursday, Saturday -Nephrology consulted to assist  Acute on chronic HFrEF -The patient has mild degree of fluid overload -He endorses some orthopnea type symptoms and increased abdominal girth -Fluid removal via dialysis -Continue carvedilol -His soft BP has limited GDMT -08/22/2022 echo EF 30-35%, grade 2 DD, normal RV function, moderate MR  Paroxysmal atrial fibrillation -Currently in sinus rhythm -Continue apixaban -Continue carvedilol  Coronary artery disease -No chest pain presently -The patient had a Myoview stress test on 08/25/2022 which showed a prior infarct consistent with prior left circumflex infarct but no ischemia noted.  -Continue apixaban and carvedilol  Uncontrolled diabetes mellitus type 2 with hyperglycemia -06/24/2022 hemoglobin A1c 8.7 -NovoLog sliding scale -He is on Lantus 14 units at bedtime at home  COPD -Continue bronchodilator therapy  PAD status post bilateral BKA -Patient with prosthesis for ambulation. -Patient initially was on aspirin and heparin and subsequently aspirin discontinued and patient transition to Eliquis.  Patient maintained on home regimen of fenofibrate and atorvastatin.  Hypertension -Continue carvedilol   Advance Care Planning: FULL  Consults: renal  Family Communication: none  Severity of Illness: The appropriate patient status for this patient is INPATIENT. Inpatient status is judged to be reasonable and necessary in order to provide the required intensity of service to ensure the patient's safety. The patient's presenting symptoms, physical exam findings, and initial radiographic and laboratory data in the context of their  chronic comorbidities is felt to place them at high risk for further clinical deterioration. Furthermore, it is not anticipated that the patient will be medically stable for discharge from the hospital within 2 midnights of admission.   * I certify that at the point of admission it is my clinical judgment that the patient will require inpatient hospital care spanning beyond 2 midnights from the point of admission due to high intensity of service, high risk for further deterioration and high frequency of surveillance required.*  Author: Catarina Hartshorn, MD 09/06/2022 7:59 AM  For on call review [email or web address].

## 2022-09-07 ENCOUNTER — Inpatient Hospital Stay (HOSPITAL_COMMUNITY): Payer: Medicare Other

## 2022-09-07 DIAGNOSIS — I480 Paroxysmal atrial fibrillation: Secondary | ICD-10-CM

## 2022-09-07 DIAGNOSIS — I5023 Acute on chronic systolic (congestive) heart failure: Secondary | ICD-10-CM

## 2022-09-07 DIAGNOSIS — J1282 Pneumonia due to coronavirus disease 2019: Secondary | ICD-10-CM

## 2022-09-07 DIAGNOSIS — U071 COVID-19: Secondary | ICD-10-CM

## 2022-09-07 DIAGNOSIS — N186 End stage renal disease: Secondary | ICD-10-CM | POA: Diagnosis not present

## 2022-09-07 DIAGNOSIS — J9601 Acute respiratory failure with hypoxia: Secondary | ICD-10-CM | POA: Diagnosis not present

## 2022-09-07 LAB — GLUCOSE, CAPILLARY
Glucose-Capillary: 261 mg/dL — ABNORMAL HIGH (ref 70–99)
Glucose-Capillary: 246 mg/dL — ABNORMAL HIGH (ref 70–99)
Glucose-Capillary: 254 mg/dL — ABNORMAL HIGH (ref 70–99)
Glucose-Capillary: 269 mg/dL — ABNORMAL HIGH (ref 70–99)
Glucose-Capillary: 270 mg/dL — ABNORMAL HIGH (ref 70–99)

## 2022-09-07 LAB — CBC
HCT: 35.4 % — ABNORMAL LOW (ref 39.0–52.0)
Hemoglobin: 11.6 g/dL — ABNORMAL LOW (ref 13.0–17.0)
MCH: 30.1 pg (ref 26.0–34.0)
MCHC: 32.8 g/dL (ref 30.0–36.0)
MCV: 91.9 fL (ref 80.0–100.0)
Platelets: 233 10*3/uL (ref 150–400)
RBC: 3.85 MIL/uL — ABNORMAL LOW (ref 4.22–5.81)
RDW: 16.5 % — ABNORMAL HIGH (ref 11.5–15.5)
WBC: 12.9 10*3/uL — ABNORMAL HIGH (ref 4.0–10.5)
nRBC: 0 % (ref 0.0–0.2)

## 2022-09-07 LAB — COMPREHENSIVE METABOLIC PANEL
ALT: 20 U/L (ref 0–44)
AST: 22 U/L (ref 15–41)
Albumin: 2.6 g/dL — ABNORMAL LOW (ref 3.5–5.0)
Alkaline Phosphatase: 51 U/L (ref 38–126)
Anion gap: 11 (ref 5–15)
BUN: 32 mg/dL — ABNORMAL HIGH (ref 6–20)
CO2: 24 mmol/L (ref 22–32)
Calcium: 7.7 mg/dL — ABNORMAL LOW (ref 8.9–10.3)
Chloride: 96 mmol/L — ABNORMAL LOW (ref 98–111)
Creatinine, Ser: 6.96 mg/dL — ABNORMAL HIGH (ref 0.61–1.24)
GFR, Estimated: 8 mL/min — ABNORMAL LOW (ref >60)
Glucose, Bld: 272 mg/dL — ABNORMAL HIGH (ref 70–99)
Potassium: 3.7 mmol/L (ref 3.5–5.1)
Sodium: 131 mmol/L — ABNORMAL LOW (ref 135–145)
Total Bilirubin: 0.7 mg/dL (ref 0.3–1.2)
Total Protein: 6.4 g/dL — ABNORMAL LOW (ref 6.5–8.1)

## 2022-09-07 LAB — D-DIMER, QUANTITATIVE: D-Dimer, Quant: 0.82 ug{FEU}/mL — ABNORMAL HIGH (ref 0.00–0.50)

## 2022-09-07 LAB — BLOOD GAS, ARTERIAL
Acid-Base Excess: 0.5 mmol/L (ref 0.0–2.0)
Acid-Base Excess: 2.6 mmol/L — ABNORMAL HIGH (ref 0.0–2.0)
Bicarbonate: 25.4 mmol/L (ref 20.0–28.0)
Bicarbonate: 27.2 mmol/L (ref 20.0–28.0)
Drawn by: 27407
Drawn by: 27407
FIO2: 80 %
O2 Saturation: 83.4 %
O2 Saturation: 100 %
Patient temperature: 36.4
Patient temperature: 36.4
pCO2 arterial: 40 mmHg (ref 32–48)
pCO2 arterial: 40 mmHg (ref 32–48)
pH, Arterial: 7.41 (ref 7.35–7.45)
pH, Arterial: 7.44 (ref 7.35–7.45)
pO2, Arterial: 49 mmHg — ABNORMAL LOW (ref 83–108)
pO2, Arterial: 144 mmHg — ABNORMAL HIGH (ref 83–108)

## 2022-09-07 LAB — C-REACTIVE PROTEIN: CRP: 2.9 mg/dL — ABNORMAL HIGH (ref <1.0)

## 2022-09-07 LAB — HEPATITIS B SURFACE ANTIBODY, QUANTITATIVE: Hep B S AB Quant (Post): <3.5 m[IU]/mL — ABNORMAL LOW

## 2022-09-07 LAB — MRSA NEXT GEN BY PCR, NASAL: MRSA by PCR Next Gen: NOT DETECTED

## 2022-09-07 MED ORDER — NEPRO/CARBSTEADY PO LIQD
237.0000 mL | Freq: Two times a day (BID) | ORAL | Status: DC
  Administered 2022-09-08 – 2022-09-09 (×2): 237 mL via ORAL

## 2022-09-07 MED ORDER — RENA-VITE PO TABS
1.0000 | ORAL_TABLET | Freq: Every day | ORAL | Status: DC
  Administered 2022-09-07 – 2022-09-08 (×2): 1 via ORAL
  Filled 2022-09-07 (×2): qty 1

## 2022-09-07 NOTE — TOC Initial Note (Signed)
Transition of Care [facility]) - Initial/Assessment Note    Patient Details  Name: Anthony Bullock MRN: [id number] Date of Birth: [birth year]  Transition of Care [facility]) CM/SW Contact:    Annice Needy, LCSW Phone Number: 09/07/2022, 4:14 PM  Clinical Narrative:                 Patient from home with spouse. Considered high risk for readmission. On HD TTS schedule. TOC will follow for d/c needs.   Expected Discharge Plan: Home w Home Health Services     Patient Goals and CMS Choice            Expected Discharge Plan and Services                                              Prior Living Arrangements/Services                       Activities of Daily Living Home Assistive Devices/Equipment: Eyeglasses, CBG Meter, Prosthesis ADL Screening (condition at time of admission) Patient's cognitive ability adequate to safely complete daily activities?: Yes Is the patient deaf or have difficulty hearing?: No Does the patient have difficulty seeing, even when wearing glasses/contacts?: No Does the patient have difficulty concentrating, remembering, or making decisions?: No Patient able to express need for assistance with ADLs?: No Does the patient have difficulty dressing or bathing?: No Independently performs ADLs?: Yes (appropriate for developmental age) Communication: Independent Dressing (OT): Independent Grooming: Independent Feeding: Independent Bathing: Independent Is this a change from baseline?: Pre-admission baseline Toileting: Independent In/Out Bed: Independent Walks in Home: Independent Does the patient have difficulty walking or climbing stairs?: Yes Weakness of Legs: Both (bilateral BKA) Weakness of Arms/Hands: None  Permission Sought/Granted                  Emotional Assessment              Admission diagnosis:  Hypoxia [R09.02] ESRD (end stage renal disease) (HCC) [N18.6] Acute respiratory failure with hypoxia (HCC)  [J96.01] COVID [U07.1] Patient Active Problem List   Diagnosis Date Noted   Pneumonia due to COVID-19 virus 09/06/2022   Acute on chronic HFrEF (heart failure with reduced ejection fraction) (HCC) 09/06/2022   Atrial fibrillation (HCC) 08/23/2022   Pneumonia due to infectious organism 08/18/2022   3rd nerve palsy, partial, right 08/09/2022   Stenosis of intracranial portion of both internal carotid arteries 08/09/2022   Diplopia 08/09/2022   Left carotid stenosis 08/09/2022   Malnutrition of moderate degree 11/18/2019   Below-knee amputation of left lower extremity (HCC) 11/16/2019   Abscess of left foot 11/13/2019   Dehiscence of amputation stump (HCC)    History of complete ray amputation of fifth toe of left foot (HCC) 10/09/2019   Acute pulmonary edema (HCC)    Osteomyelitis of fifth toe of left foot (HCC)    Febrile illness 08/17/2019   SIRS (systemic inflammatory response syndrome) (HCC) 08/17/2019   Thrombocytopenia (HCC) 08/17/2019   Pressure injury of back, stage 2 (HCC) 08/17/2019   Hypertensive emergency 07/10/2019   Onychomycosis 08/16/2016   Osteopathy in diseases classified elsewhere, unspecified site 05/14/2016   S/P bilateral below knee amputation (HCC) 04/26/2016   Bilateral carotid bruits 03/30/2016   Diabetes mellitus with complication (HCC)    Anemia due to chronic kidney disease    ESRD  on dialysis [facility]) 11/30/2015   Acute respiratory failure with hypoxia (HCC) 11/29/2015   Dependence on renal dialysis (HCC) 08/15/2015   Presence of automatic (implantable) cardiac defibrillator 03/12/2015   At high risk for falls 09/29/2014   Peripheral vascular disease (HCC) 09/29/2014   Osteoarthritis of both knees 01/07/2014   Osteoarthritis of multiple joints 10/06/2013   NICM (nonischemic cardiomyopathy) (HCC) 06/27/2013   CAD (coronary artery disease) 03/13/2013   Abnormal nuclear stress test 01/27/2013   Chronic systolic congestive heart failure (HCC) 01/27/2013    COPD (chronic obstructive pulmonary disease) (HCC) 12/27/2012   Diabetic peripheral neuropathy (HCC) 02/10/2012   Urethral stricture 10/06/2010   URINARY CALCULUS 09/28/2009   Adjustment disorder with depressed mood 08/21/2009   Type 2 diabetes mellitus with hyperlipidemia (HCC) 04/14/2008   Hyperlipidemia 04/14/2008   Essential hypertension 04/14/2008   Allergic rhinitis 04/14/2008   PCP:  Kristian Covey, MD Pharmacy:   [facility] [address] [place] - [address] [place] [address] Phone: [phone] Fax: [phone]  [facility] [id number], [place] - [address] [place] [address] Phone: [phone] Fax: [phone]  [facility] [address] [place] [address] Phone: [phone] Fax: [phone]     Social Determinants of Health (SDOH) Social History: SDOH Screenings   Food Insecurity: No Food Insecurity (09/06/2022)  Housing: Low Risk  (09/06/2022)  Transportation Needs: No Transportation Needs (09/06/2022)  Utilities: Not At Risk (09/06/2022)  Alcohol Screen: Low Risk  (01/20/2020)  Depression (PHQ2-9): Low Risk  (01/24/2022)  Financial Resource Strain: Low Risk  (01/24/2022)  Physical Activity: Inactive (01/24/2022)  Social Connections: Socially Integrated (01/24/2022)  Stress: No Stress Concern Present (01/24/2022)  Tobacco Use: Medium Risk (09/06/2022)   SDOH Interventions:     Readmission Risk Interventions     No data to display

## 2022-09-07 NOTE — Progress Notes (Signed)
Patient taken off of BiPAP and placed on 3L [place]. Patient tolerating well at this time. BiPAP remains at bedside when needed. RN aware.

## 2022-09-07 NOTE — Care Management Important Message (Signed)
Important Message  Patient Details  Name: Anthony Bullock MRN: [id number] Date of Birth: Apr 30, 1960   Medicare Important Message Given:  N/A - LOS <3 / Initial given by admissions     Corey Harold 09/07/2022, 10:33 AM

## 2022-09-07 NOTE — Progress Notes (Signed)
Initial Nutrition Assessment  DOCUMENTATION CODES:   Not applicable  INTERVENTION:   -Continue with liberalized diet of carb modified -Renal MVI daily -Nepro Shake po BID, each supplement provides 425 kcal and 19 grams protein  -Double protein portions with meals  NUTRITION DIAGNOSIS:   Increased nutrient needs related to chronic illness (ESRD on HD) as evidenced by estimated needs.  GOAL:   Patient will meet greater than or equal to 90% of their needs  MONITOR:   PO intake, Supplement acceptance  REASON FOR ASSESSMENT:   Malnutrition Screening Tool    ASSESSMENT:   Pt with a history of hypertension, hyperlipidemia, diabetes mellitus type 2, bilateral BKA, ESRD (TTS),HFrEF (EF 30-35%), anxiety/depression presenting with cough and shortness of breath that had worsened in the last 24 hours prior to admission.  Pt admitted with acute respiratory failure and COVID-19 pneumonia.   Reviewed I/O's: -1.9 L x 24 hours   Pt unavailable at time of visit. Attempted to speak with pt via call to hospital room phone, however, unable to reach. RD unable to obtain further nutrition-related history or complete nutrition-focused physical exam at this time.    Pt currently on a carb modified diet. Noted good intake; meal completions 80-100%.   Noted pt taken off Bi-pap this morning and transitioned to nasal cannula.   Reviewed wt hx; pt has experienced a 4.9% wt loss over the past month. While this is not significant for time frame, it is concerning given multiple co-morbidities. Unsure of EDW. Suspect some wt loss may be related to fluid changes from HD.   Pt would benefit from addition of oral nutrition supplements.   Medications reviewed and include sensipar, neurontin, and solu-medrol.  Lab Results  Component Value Date   HGBA1C 8.7 (A) 06/24/2022   PTA DM medications are 28 units humalog BID and 12 units lantus daily at bedtime.   Labs reviewed: Na: 131, CBGS: 246-261  (inpatient orders for glycemic control are 0-5 units insulin aspart daily at bedtime, 0-9 units insulin aspart TID with meal,s and 6 units insulin glargine-yfgn daily).    Diet Order:   Diet Order             Diet Carb Modified Fluid consistency: Thin  Diet effective now                   EDUCATION NEEDS:   No education needs have been identified at this time  Skin:  Skin Assessment: Reviewed RN Assessment  Last BM:  09/05/22  Height:   Ht Readings from Last 1 Encounters:  09/06/22 5\' 11"  (1.803 m)    Weight:   Wt Readings from Last 1 Encounters:  09/06/22 93.9 kg    Ideal Body Weight:  78.2 kg  BMI:  Body mass index is 28.87 kg/m.  Estimated Nutritional Needs:   Kcal:  2150-2350  Protein:  115-130 grams  Fluid:  1000 ml +UOP    Levada Schilling, RD, LDN, CDCES Registered Dietitian II Certified Diabetes Care and Education Specialist Please refer to [facility] for RD and/or RD on-call/weekend/after hours pager

## 2022-09-07 NOTE — Progress Notes (Signed)
Patient ID: Anthony Bullock, male   DOB: [birth year], 61 y.o.   MRN: [id number] S: Pt transferred to ICU last night due to increased work of breathing and placed on BiPAP.  After HD was able to wean to 3L [place]. O:BP 103/65   Pulse 88   Temp 97.8 F (36.6 C) (Oral)   Resp (!) 25   Ht 5\' 11"  (1.803 m)   Wt 93.9 kg   SpO2 96%   BMI 28.87 kg/m   Intake/Output Summary (Last 24 hours) at 09/07/2022 0944 Last data filed at 09/07/2022 0300 Gross per 24 hour  Intake 643.8 ml  Output 2500 ml  Net -1856.2 ml   Intake/Output: I/O last 3 completed shifts: In: 643.8 [P.O.:240; I.V.:144.5; IV Piggyback:259.3] Out: 2500 [Other:2500]  Intake/Output this shift:  No intake/output data recorded. Weight change: -3.588 kg   Recent Labs  Lab 09/02/22 1143 09/06/22 0107 09/06/22 2013 09/07/22 0523  NA 133* 133* 130* 131*  K 3.5 3.5 4.2 3.7  CL 94* 93* 93* 96*  CO2 26 24 21* 24  GLUCOSE 178* 140* 339* 272*  BUN 34* 49* 59* 32*  CREATININE 7.54* 10.06* 11.71* 6.96*  ALBUMIN  --  3.2*  --  2.6*  CALCIUM 7.8* 8.7* 7.8* 7.7*  AST  --  27  --  22  ALT  --  23  --  20   Liver Function Tests: Recent Labs  Lab 09/06/22 0107 09/07/22 0523  AST 27 22  ALT 23 20  ALKPHOS 65 51  BILITOT 0.7 0.7  PROT 7.2 6.4*  ALBUMIN 3.2* 2.6*   No results for input(s): "LIPASE", "AMYLASE" in the last 168 hours. No results for input(s): "AMMONIA" in the last 168 hours. CBC: Recent Labs  Lab 09/06/22 0107 09/06/22 2013 09/07/22 0523  WBC 14.0* 19.7* 12.9*  NEUTROABS 11.2*  --   --   HGB 12.5* 13.7 11.6*  HCT 38.7* 43.0 35.4*  MCV 92.1 94.9 91.9  PLT 238 340 233   Cardiac Enzymes: No results for input(s): "CKTOTAL", "CKMB", "CKMBINDEX", "TROPONINI" in the last 168 hours. CBG: Recent Labs  Lab 09/06/22 1655 09/06/22 1956 09/06/22 2040 09/07/22 0257 09/07/22 0754  GLUCAP 199* 307* 288* 261* 246*    Iron Studies: No results for input(s): "IRON", "TIBC", "TRANSFERRIN", "FERRITIN" in the last 72  hours. Studies/Results: DG Chest 1 View  Result Date: 09/07/2022 CLINICAL DATA:  Shortness of breath following dialysis this morning, initial encounter EXAM: PORTABLE CHEST 1 VIEW COMPARISON:  09/06/2022 FINDINGS: Cardiac shadow is stable. Defibrillator and dialysis catheter are again seen. Diffuse vascular congestion and right-sided effusion are seen. IMPRESSION: Stable CHF Electronically Signed   By: Alcide Clever M.D.   On: 09/07/2022 03:35   DG Chest 1 View  Result Date: 09/06/2022 CLINICAL DATA:  Shortness of breath and COVID-19 positivity EXAM: PORTABLE CHEST 1 VIEW COMPARISON:  09/06/2022 FINDINGS: Cardiac shadow is stable. Defibrillator is again noted. Dialysis catheter is again seen. Diffuse central vascular congestion is noted significantly increased when compared with the prior exam consistent with worsening CHF. New right effusion is noted. No bony abnormality is seen. IMPRESSION: Worsening CHF. Electronically Signed   By: Alcide Clever M.D.   On: 09/06/2022 21:48   DG Chest Portable 1 View  Result Date: 09/06/2022 CLINICAL DATA:  Shortness of breath EXAM: PORTABLE CHEST 1 VIEW COMPARISON:  08/25/2022 FINDINGS: Defibrillator is again noted. Cardiac shadow is stable. Dialysis catheter is seen in satisfactory position. The lungs are well aerated. No focal  infiltrate is noted. Mild central vascular congestion is seen. No effusions are noted. IMPRESSION: Mild vascular congestion stable from the prior study. Electronically Signed   By: Alcide Clever M.D.   On: 09/06/2022 03:05    apixaban  5 mg Oral BID   atorvastatin  80 mg Oral QHS   azithromycin  500 mg Oral Daily   calcitRIOL  0.5 mcg Oral Q T,Th,Sa-HD   carvedilol  12.5 mg Oral BID   Chlorhexidine Gluconate Cloth  6 each Topical Q0600   cinacalcet  60 mg Oral Q T,Th,Sa-HD   erythromycin  1 Application Right Eye QHS   feeding supplement (NEPRO CARB STEADY)  237 mL Oral BID BM   fenofibrate  160 mg Oral Daily   gabapentin  600 mg Oral  BID   insulin aspart  0-5 Units Subcutaneous QHS   insulin aspart  0-9 Units Subcutaneous TID WC   insulin glargine-yfgn  6 Units Subcutaneous Daily   lanthanum  1,000 mg Oral TID WC   lanthanum  500 mg Oral With snacks   methylPREDNISolone (SOLU-MEDROL) injection  60 mg Intravenous Q12H   midodrine  10 mg Oral Q T,Th,Sa-HD   multivitamin  1 tablet Oral QHS   nortriptyline  25 mg Oral QHS   tobramycin  1 drop Right Eye Q6H    BMET    Component Value Date/Time   NA 131 (L) 09/07/2022 0523   K 3.7 09/07/2022 0523   CL 96 (L) 09/07/2022 0523   CO2 24 09/07/2022 0523   GLUCOSE 272 (H) 09/07/2022 0523   BUN 32 (H) 09/07/2022 0523   BUN 75 (A) 11/07/2017 0000   CREATININE 6.96 (H) 09/07/2022 0523   CALCIUM 7.7 (L) 09/07/2022 0523   CALCIUM 13.6 (HH) 06/04/2015 1300   GFRNONAA 8 (L) 09/07/2022 0523   GFRAA 8 (L) 09/04/2019 0420   CBC    Component Value Date/Time   WBC 12.9 (H) 09/07/2022 0523   RBC 3.85 (L) 09/07/2022 0523   HGB 11.6 (L) 09/07/2022 0523   HCT 35.4 (L) 09/07/2022 0523   PLT 233 09/07/2022 0523   MCV 91.9 09/07/2022 0523   MCH 30.1 09/07/2022 0523   MCHC 32.8 09/07/2022 0523   RDW 16.5 (H) 09/07/2022 0523   LYMPHSABS 1.2 09/06/2022 0107   MONOABS 1.1 (H) 09/06/2022 0107   EOSABS 0.3 09/06/2022 0107   BASOSABS 0.1 09/06/2022 0107   -outpatient HD orders: [facility], TTS.  EDW 91.5 kg.  TDC.  Flow rates: 400/500.  1K/2.5 calcium.  No heparin (only receives heparin locks).  Meds: Sensipar to 60 mg q. treatment, calcitriol 0.5 mcg q. treatment, Mircera 30 mcg every 4 weeks   Assessment/Plan:  SOB, Covid+ - transferred to ICU due to worsening respiratory status.  Improved after HD with 2.5 L UF.  Given remdesivir per primary along with solumedrol. Currently on 3L [place].  ESRD - continue with HD on TTS schedule. Volume/HTN - UF as tolerated with HD. Anemia of ESKD - no indication for ESA at this time. Secondary Hyperparathyroidism -  resume home binders along  with calcitriol and sensipar.  Irena Cords, MD [facility]

## 2022-09-07 NOTE — Progress Notes (Addendum)
HEMODIALYSIS TREATMENT NOTE:  Prior to treatment pt was transferred from 3A to step-down unit for worsening respiratory status.  Now requiring Bi-PAP.  127/71, HR 133 Afib.  HD was initiated with self prime and a 3L goal.  Tolerated UF for 45 minutes before BP began dropping.  Albumin is contraindicated for Epoetin allergy.  Unable to remove BiPAP mask to give Midodrine.  Dr. Thomes Dinning assessed pt. Goal was lowered to 1.5 liters after which HR slowed to 110-120 and SBPs remained 100-118.  Neosynephrine and Amiodarone infusions were started with 1h 65m RTD.  Goal was readjusted.  Net UF 2.5 liters.  Post-treatment:  09/07/22 0145  Vitals  BP (!) 107/91  MAP (mmHg) 95  BP Location Right Wrist  BP Method Automatic  Patient Position (if appropriate) Lying  Pulse Rate (!) 49  Pulse Rate Source Monitor  ECG Heart Rate (!) 113  Resp (!) 29  Oxygen Therapy  SpO2 100 %  O2 Device Bi-PAP  Post Treatment  Dialyzer Clearance Heavily streaked  Hemodialysis Intake (mL) 0 mL  Liters Processed 7532  Fluid Removed (mL) 2500 mL  Tolerated HD Treatment Yes  Post-Hemodialysis Comments See PN  Hemodialysis Catheter Right Internal jugular Double-lumen;Permanent  Placement Date/Time: 12/28/15 1732   Time Out: Correct patient;Correct site;Correct procedure  Maximum sterile barrier precautions: Hand hygiene;Sterile gown;Sterile gloves;Large sterile sheet;Mask;Cap  Site Prep: Chlorhexidine  Local Anesthetic: Inje...  Site Condition No complications  Blue Lumen Status Flushed;Heparin locked;Dead end cap in place  Red Lumen Status Flushed;Heparin locked;Dead end cap in place  Purple Lumen Status N/A  Catheter fill solution Heparin 1000 units/ml  Catheter fill volume (Arterial) 1.9 cc  Catheter fill volume (Venous) 1.9  Dressing Type Transparent;Tube stabilization device  Dressing Status Antimicrobial disc in place;Clean, Dry, Intact  Interventions New dressing  Drainage Description None  Dressing Change  Due 09/14/22  Post treatment catheter status Capped and Clamped   RT to re-check ABG.    Arman Filter, RN [facility] ICU

## 2022-09-07 NOTE — Inpatient Diabetes Management (Signed)
Inpatient Diabetes Program Recommendations  AACE/ADA: New Consensus Statement on Inpatient Glycemic Control   Target Ranges:  Prepandial:   less than 140 mg/dL      Peak postprandial:   less than 180 mg/dL (1-2 hours)      Critically ill patients:  140 - 180 mg/dL    Latest Reference Range & Units 09/06/22 11:10 09/06/22 16:55 09/06/22 19:56 09/06/22 20:40 09/07/22 02:57 09/07/22 07:54  Glucose-Capillary 70 - 99 mg/dL 756 (H) 433 (H) 295 (H) 288 (H) 261 (H) 246 (H)   Review of Glycemic Control  Diabetes history: DM2 Outpatient Diabetes medications: Lantus 14 units at bedtime, Humalog 2-28 units BID (with lunch and supper) Current orders for Inpatient glycemic control: Semglee 6 units daily, Novolog 0-9 units TID with meals, Novolog 0-5 units at bedtime; Solumedrol 60 mg Q12H  Inpatient Diabetes Program Recommendations:    Insulin: If steroids are continued, please consider increasing Semglee to 10 units daily and ordering Novolog 3 units TID with meals for meal coverage if patient eats at least 50% of meals.  Thanks, Orlando Penner, RN, MSN, CDCES Diabetes Coordinator Inpatient Diabetes Program [phone] (Team Pager from 8am to 5pm)

## 2022-09-07 NOTE — Progress Notes (Signed)
Progress Note   Patient: Anthony Bullock:[id number] DOB: Jan 10, 1961 DOA: 09/06/2022     1 DOS: the patient was seen and examined on 09/07/2022   Brief hospital course: 62 year old male with a history of hypertension, hyperlipidemia, diabetes mellitus type 2, bilateral BKA, ESRD (TTS),HFrEF (EF 30-35%), anxiety/depression presenting with cough and shortness of breath that had worsened in the last 24 hours prior to admission.  Notably, the patient had a recent hospital admission from 08/18/2022 to 08/26/2022 for acute respiratory failure secondary to fluid overload.  The patient's fluid status was managed with hemodialysis.  He required extra hemodialysis sessions.  His hospitalization was prolonged secondary to new onset A-fib and new diagnosis of cardiomyopathy with EF 30-35%.  Cardiology was consulted.  He was on a diltiazem drip temporarily.  He converted to sinus rhythm.  His GDMT was limited secondary to intradialytic low blood pressures.  He was discharged home with carvedilol and apixaban. The patient has had a history of dietary indiscretion which led to his fluid overload.  However, he states that he has been compliant with his diet and fluid restriction since discharge from his last hospitalization.  He denies any fevers, chills, chest pain, headache, neck pain, hemoptysis.  He has a nonproductive cough.  He has some upper abdominal pain with coughing.  He had 1 episode of emesis on 09/04/2022, but this has resolved.  He had 1 loose stool on 09/04/2022.  This has resolved.  There is no hematochezia or melena. Because of his worsening shortness of breath and cough, he presented for further evaluation and treatment.  In the ED, the patient was afebrile.  He was hypoxic with oxygen saturation 89% room air.  He was hemodynamically stable.  He was placed on 2 L with saturation 95%.  WBC 14.0, hemoglobin 12.5, platelets 230,000.  Sodium 133, potassium 3.5, bicarbonate 24, serum creatinine of 10.06.  AST 26,  ALT 23, alk phosphatase 65, total bilirubin 0.7.  Chest x-ray showed venous congestion and chronic interstitial markings.  Nephrology was consulted to assist with management.  Assessment and Plan: 1-acute respiratory failure with hypoxia -Appears to be multifactorial in the setting of COVID-19 infection, pneumonia and fluid overload -Patient in the requiring the use of BiPAP to stabilize his breathing. -Continue current antibiotics, remdesivir, steroids, flutter valve, bronchodilators and supportive care -Hemodialysis to help with stabilizing patient's volume status -Continue mucolytics, vitamin C and zinc -Wean off oxygen supplementation and follow clinical response.  2-community-acquired pneumonia/COVID-19 infection -Elevated procalcitonin appreciated -Patient has been started on ceftriaxone and Zithromax -Continue remdesivir, steroids and follow inflammatory markers. -Vitamin C, zinc and mucolytics will be added to his regimen.  3-ESRD -Continue dialysis per nephrology service.  4-acute on chronic combined systolic and diastolic heart failure -Most recent ejection fraction 30 to 35% on August 22, 2022 -Continue daily weights, strict I's and O's and low sodium diet -Volume management with hemodialysis -Follow red sleep management -Patient GDMT is limited by low blood pressure -Continue beta-blocker.  5-paroxysmal atrial fibrillation -Continue apixaban and continue beta-blocker -Currently sinus rhythm.  6-history of coronary artery disease -Patient reports no chest pain -Continue patient follow-up with cardiology service -Continue beta-blocker and apixaban.  7-history of COPD -Continue steroids and bronchodilator management as outlined above.  8-peripheral arterial disease -Status post bilateral BKA -Continue Eliquis -Continue the use of statins  9-hypertension -Continue carvedilol -Soft blood pressure currently appreciated -Follow vital signs.  10-uncontrolled type  2 diabetes -A1c 8.7 -Continue sliding scale insulin and Lantus -Anticipate elevated CBGs  in the setting of a steroid usage.   Subjective:  Afebrile, no chest pain, no nausea, no vomiting.  Tachypneic, demonstrating difficulty speaking in full sentences and requiring the use of BiPAP.  Physical Exam: Vitals:   09/07/22 1139 09/07/22 1200 09/07/22 1430 09/07/22 1616  BP:  (!) 120/36    Pulse:  68 76   Resp:  (!) 24 (!) 21   Temp: 97.9 F (36.6 C)   98.1 F (36.7 C)  TempSrc: Axillary   Oral  SpO2:  100% 100%   Weight:      Height:       General exam: Alert, awake, oriented x 3; reporting still feeling short of breath but improving; patient on BiPAP at time of examination.  No fever, no chest pain, no nausea vomiting. Respiratory system: Decreased breath sounds at the bases; positive rhonchi bilaterally.  Tachypnea appreciated. Cardiovascular system:RRR. No rubs or gallops; positive systolic ejection murmur. Gastrointestinal system: Abdomen is nondistended, soft and nontender. No organomegaly or masses felt. Normal bowel sounds heard. Central nervous system: No focal neurological deficits. Extremities: Status post bilateral BKA ; trace edema appreciated bilaterally on his thighs. Skin: No petechiae. Psychiatry: Judgement and insight appear normal. Mood & affect appropriate.   Data Reviewed: ABG: FiO2 80%; pH 7.41, CO2 40, pO2 49, HCO3 25.4 Comprehensive metabolic panel: Sodium 131, potassium 3.7, chloride 96, bicarb 24, glucose 272, BUN 32, creatinine 0.96, normal LFTs and anion gap 11 CBC: White blood cells 12.9, hemoglobin 11.6 and platelet count 2 33K CRP: 2.9 D-dimer: 0.82   Family Communication: No family at bedside.  Disposition: Status is: Inpatient Remains inpatient appropriate because: Continue treatment with IV antibiotics, remdesivir and wean off BiPAP as tolerated.   Planned Discharge Destination: Home   CRITICAL CARE Performed by: Vassie Loll  MD   Total critical care time: 55 minutes  Critical care time was exclusive of separately billable procedures and treating other patients.  Critical care was necessary to treat or prevent imminent or life-threatening deterioration.  Critical care was time spent personally by me on the following activities: development of treatment plan with patient and/or surrogate as well as nursing, discussions with consultants, evaluation of patient's response to treatment, examination of patient, obtaining history from patient or surrogate, ordering and performing treatments and interventions, ordering and review of laboratory studies, ordering and review of radiographic studies, pulse oximetry and re-evaluation of patient's condition.   Author: Vassie Loll, MD 09/07/2022 6:25 PM  For on call review [email or web address].

## 2022-09-08 DIAGNOSIS — U071 COVID-19: Secondary | ICD-10-CM | POA: Diagnosis not present

## 2022-09-08 DIAGNOSIS — N186 End stage renal disease: Secondary | ICD-10-CM | POA: Diagnosis not present

## 2022-09-08 DIAGNOSIS — I480 Paroxysmal atrial fibrillation: Secondary | ICD-10-CM | POA: Diagnosis not present

## 2022-09-08 DIAGNOSIS — J9601 Acute respiratory failure with hypoxia: Secondary | ICD-10-CM | POA: Diagnosis not present

## 2022-09-08 LAB — RENAL FUNCTION PANEL
Albumin: 2.6 g/dL — ABNORMAL LOW (ref 3.5–5.0)
Anion gap: 14 (ref 5–15)
BUN: 68 mg/dL — ABNORMAL HIGH (ref 6–20)
CO2: 23 mmol/L (ref 22–32)
Calcium: 7.8 mg/dL — ABNORMAL LOW (ref 8.9–10.3)
Chloride: 95 mmol/L — ABNORMAL LOW (ref 98–111)
Creatinine, Ser: 9.49 mg/dL — ABNORMAL HIGH (ref 0.61–1.24)
GFR, Estimated: 6 mL/min — ABNORMAL LOW (ref >60)
Glucose, Bld: 426 mg/dL — ABNORMAL HIGH (ref 70–99)
Phosphorus: 6.1 mg/dL — ABNORMAL HIGH (ref 2.5–4.6)
Potassium: 4.4 mmol/L (ref 3.5–5.1)
Sodium: 132 mmol/L — ABNORMAL LOW (ref 135–145)

## 2022-09-08 LAB — CBC
HCT: 32.2 % — ABNORMAL LOW (ref 39.0–52.0)
Hemoglobin: 10.3 g/dL — ABNORMAL LOW (ref 13.0–17.0)
MCH: 30 pg (ref 26.0–34.0)
MCHC: 32 g/dL (ref 30.0–36.0)
MCV: 93.9 fL (ref 80.0–100.0)
Platelets: 222 10*3/uL (ref 150–400)
RBC: 3.43 MIL/uL — ABNORMAL LOW (ref 4.22–5.81)
RDW: 16.8 % — ABNORMAL HIGH (ref 11.5–15.5)
WBC: 11.8 10*3/uL — ABNORMAL HIGH (ref 4.0–10.5)
nRBC: 0 % (ref 0.0–0.2)

## 2022-09-08 LAB — D-DIMER, QUANTITATIVE: D-Dimer, Quant: 0.62 ug{FEU}/mL — ABNORMAL HIGH (ref 0.00–0.50)

## 2022-09-08 LAB — GLUCOSE, CAPILLARY
Glucose-Capillary: 320 mg/dL — ABNORMAL HIGH (ref 70–99)
Glucose-Capillary: 296 mg/dL — ABNORMAL HIGH (ref 70–99)
Glucose-Capillary: 320 mg/dL — ABNORMAL HIGH (ref 70–99)
Glucose-Capillary: 268 mg/dL — ABNORMAL HIGH (ref 70–99)

## 2022-09-08 LAB — C-REACTIVE PROTEIN: CRP: 1.7 mg/dL — ABNORMAL HIGH (ref <1.0)

## 2022-09-08 MED ORDER — VITAMIN C 500 MG PO TABS
1000.0000 mg | ORAL_TABLET | Freq: Every day | ORAL | Status: DC
  Administered 2022-09-08 – 2022-09-09 (×2): 1000 mg via ORAL
  Filled 2022-09-08 (×2): qty 2

## 2022-09-08 MED ORDER — INSULIN GLARGINE-YFGN 100 UNIT/ML ~~LOC~~ SOLN
10.0000 [IU] | Freq: Every day | SUBCUTANEOUS | Status: DC
  Administered 2022-09-08: 10 [IU] via SUBCUTANEOUS
  Filled 2022-09-08 (×2): qty 0.1

## 2022-09-08 MED ORDER — SODIUM CHLORIDE 0.9 % IV SOLN
250.0000 mL | INTRAVENOUS | Status: DC
  Administered 2022-09-08: 250 mL via INTRAVENOUS

## 2022-09-08 MED ORDER — AMIODARONE HCL 200 MG PO TABS
200.0000 mg | ORAL_TABLET | Freq: Two times a day (BID) | ORAL | Status: DC
  Administered 2022-09-08 – 2022-09-09 (×3): 200 mg via ORAL
  Filled 2022-09-08 (×3): qty 1

## 2022-09-08 MED ORDER — HEPARIN SODIUM (PORCINE) 1000 UNIT/ML IJ SOLN
INTRAMUSCULAR | Status: AC
  Filled 2022-09-08: qty 6

## 2022-09-08 MED ORDER — CALCITRIOL 0.25 MCG PO CAPS
ORAL_CAPSULE | ORAL | Status: AC
  Filled 2022-09-08: qty 2

## 2022-09-08 MED ORDER — PHENYLEPHRINE HCL-NACL 20-0.9 MG/250ML-% IV SOLN: 25.0000 ug/min | INTRAVENOUS | Status: DC

## 2022-09-08 MED ORDER — ZINC SULFATE 220 (50 ZN) MG PO CAPS
220.0000 mg | ORAL_CAPSULE | Freq: Every day | ORAL | Status: DC
  Administered 2022-09-08 – 2022-09-09 (×2): 220 mg via ORAL
  Filled 2022-09-08 (×2): qty 1

## 2022-09-08 MED ORDER — MIDODRINE HCL 5 MG PO TABS
ORAL_TABLET | ORAL | Status: AC
  Filled 2022-09-08: qty 2

## 2022-09-08 NOTE — Plan of Care (Signed)
  Problem: Clinical Measurements: Goal: Ability to maintain clinical measurements within normal limits will improve Outcome: Progressing   Problem: Activity: Goal: Risk for activity intolerance will decrease Outcome: Progressing   Problem: Pain Managment: Goal: General experience of comfort will improve Outcome: Progressing   Problem: Safety: Goal: Ability to remain free from injury will improve Outcome: Progressing   Problem: Coping: Goal: Ability to adjust to condition or change in health will improve Outcome: Progressing

## 2022-09-08 NOTE — Progress Notes (Signed)
Nutrition Follow-up  DOCUMENTATION CODES:   Not applicable  INTERVENTION:   -Continue with liberalized diet of carb modified -Continue renal MVI daily -Continue Nepro Shake po BID, each supplement provides 425 kcal and 19 grams protein  -Continue double protein portions with meals  NUTRITION DIAGNOSIS:   Increased nutrient needs related to chronic illness (ESRD on HD) as evidenced by estimated needs.  Ongoing  GOAL:   Patient will meet greater than or equal to 90% of their needs  Progressing   MONITOR:   PO intake, Supplement acceptance  REASON FOR ASSESSMENT:   Malnutrition Screening Tool    ASSESSMENT:   Pt with a history of hypertension, hyperlipidemia, diabetes mellitus type 2, bilateral BKA, ESRD (TTS),HFrEF (EF 30-35%), anxiety/depression presenting with cough and shortness of breath that had worsened in the last 24 hours prior to admission.  Reviewed I/O's: +360 ml x 24 hours and -1.5 L since admission  Pt required Bi-pap this AM, but has transitioned to nasal cannula.   Pt receiving HD at time of visit. Pt pleasant and in good spirits today. He reports feeling better. Per pt, he has a good appetite, but is not keen about hospital food. Noted pt consumed about 40% of breakfast and lunch meal untouched.   PTA pt consumes 3 meals per day. Pt shares that he consumes this on both HD and non-HD days, but meal timings are variable due to early morning HD time. Pt typically consumes eggs and toast for breakfast, sandwich for lunch, and a meat, starch, and vegetable for dinner. Pt shares that he does not take any supplements or vitamins with HD, but does take a phosphate binder with meals.   Pt denies any weight loss. He reports his dry weight is around 195# and this has been stable over the past several months. Reviewed wt hx; pt has experienced a 4.9% wt loss over the past month.   Discussed importance of good meal and supplement intake to promote healing.    Medications reviewed and include solu-medrol.    Labs reviewed: Na: 132, CBGS: 254-320 (inpatient orders for glycemic control are 0-5 units insulin aspart daily at bedtime, 0-9 units insulin aspart TID with meals, and 6 units insulin glargine-yfgn daily).  Noted DM coordinator recommendations (increased 12 units insulin glarigfne-yfgn daily and 4 units insulin aspart TID with meals)  NUTRITION - FOCUSED PHYSICAL EXAM:  Flowsheet Row Most Recent Value  Orbital Region No depletion  Upper Arm Region No depletion  Thoracic and Lumbar Region No depletion  Buccal Region No depletion  Temple Region No depletion  Clavicle Bone Region No depletion  Clavicle and Acromion Bone Region No depletion  Scapular Bone Region No depletion  Dorsal Hand No depletion  Patellar Region No depletion  Anterior Thigh Region No depletion  Posterior Calf Region No depletion  Edema (RD Assessment) Mild  Hair Reviewed  Eyes Reviewed  Mouth Reviewed  Skin Reviewed  Nails Reviewed       Diet Order:   Diet Order             Diet Carb Modified Fluid consistency: Thin  Diet effective now                   EDUCATION NEEDS:   Education needs have been addressed  Skin:  Skin Assessment: Reviewed RN Assessment  Last BM:  09/05/22  Height:   Ht Readings from Last 1 Encounters:  09/06/22 5\' 11"  (1.803 m)    Weight:   Wt  Readings from Last 1 Encounters:  09/06/22 93.9 kg    Ideal Body Weight:  68 kg (adjusted for bilateral BKA)  BMI:  Body mass index is 28.87 kg/m.  Estimated Nutritional Needs:   Kcal:  2050-2250  Protein:  100-115 grams  Fluid:  1000 ml +UOP    Levada Schilling, RD, LDN, CDCES Registered Dietitian II Certified Diabetes Care and Education Specialist Please refer to [facility] for RD and/or RD on-call/weekend/after hours pager

## 2022-09-08 NOTE — Progress Notes (Signed)
Patient taken off of BiPAP and placed on 3L [place]. He is tolerating well at this time. BiPAP remains in room at bedside if needed.

## 2022-09-08 NOTE — Progress Notes (Signed)
   HEMODIALYSIS TREATMENT NOTE:  Pre-HD 131/53, NSR 80s, off Neo-synephrine and Amiodarone infusions.  Sp 100% on Bi-PAP.  HD was initiated with 3L goal.  Had to lower DFR to 200 d/t low incoming water pressure.  BiPAP was removed one hour into treatment so he could take Midodrine 10 mg.  Wanda O2 was adjusted and he maintained saturations >94% on 1L O2.  Still says he feels short of breath, especially when reclined.  3.5 hour heparin-free treatment completed.  Goal met: 3 liters removed.  No interruption in UF.  Did not require vasopressor.  Post-HD:  09/08/22 1445  Vitals  Temp 98 F (36.7 C)  Temp Source Oral  BP (!) 130/43  MAP (mmHg) 73  BP Location Right Arm  BP Method Automatic  Patient Position (if appropriate) Sitting  Pulse Rate 75  Pulse Rate Source Monitor  ECG Heart Rate 75  Resp (!) 22  Oxygen Therapy  SpO2 95 %  O2 Device Nasal Cannula  O2 Flow Rate (L/min) 1 L/min  Post Treatment  Dialyzer Clearance Lightly streaked  Hemodialysis Intake (mL) 0 mL  Liters Processed 84  Fluid Removed (mL) 3000 mL  Tolerated HD Treatment Yes  Post-Hemodialysis Comments Goal met  Hemodialysis Catheter Right Internal jugular Double-lumen;Permanent  Placement Date/Time: 12/28/15 1732   Time Out: Correct patient;Correct site;Correct procedure  Maximum sterile barrier precautions: Hand hygiene;Sterile gown;Sterile gloves;Large sterile sheet;Mask;Cap  Site Prep: Chlorhexidine  Local Anesthetic: Inje...  Site Condition No complications  Blue Lumen Status Flushed;Heparin locked;Dead end cap in place  Red Lumen Status Flushed;Heparin locked;Dead end cap in place  Purple Lumen Status N/A  Catheter fill solution Heparin 1000 units/ml  Catheter fill volume (Arterial) 1.9 cc  Catheter fill volume (Venous) 1.9  Dressing Type Transparent;Tube stabilization device  Dressing Status Antimicrobial disc in place;Clean, Dry, Intact  Drainage Description None  Dressing Change Due 09/13/22  Post  treatment catheter status Capped and Clamped   Arman Filter, RN [facility] ICU 12

## 2022-09-08 NOTE — Inpatient Diabetes Management (Signed)
Inpatient Diabetes Program Recommendations  AACE/ADA: New Consensus Statement on Inpatient Glycemic Control   Target Ranges:  Prepandial:   less than 140 mg/dL      Peak postprandial:   less than 180 mg/dL (1-2 hours)      Critically ill patients:  140 - 180 mg/dL    Latest Reference Range & Units 09/07/22 07:54 09/07/22 11:36 09/07/22 16:12 09/07/22 22:09 09/08/22 07:43  Glucose-Capillary 70 - 99 mg/dL 409 (H) 811 (H) 914 (H) 270 (H) 320 (H)   Review of Glycemic Control  Diabetes history: DM2 Outpatient Diabetes medications: Lantus 14 units at bedtime, Humalog 2-28 units BID (with lunch and supper) Current orders for Inpatient glycemic control: Semglee 6 units daily, Novolog 0-9 units TID with meals, Novolog 0-5 units at bedtime; Solumedrol 60 mg Q12H   Inpatient Diabetes Program Recommendations:     Insulin: If steroids are continued, please consider increasing Semglee to 12 units daily and ordering Novolog 4 units TID with meals for meal coverage if patient eats at least 50% of meals.   Thanks, Orlando Penner, RN, MSN, CDCES Diabetes Coordinator Inpatient Diabetes Program [phone] (Team Pager from 8am to 5pm)

## 2022-09-08 NOTE — Procedures (Signed)
I was present at this dialysis session. I have reviewed the session itself and made appropriate changes.   Vital signs in last 24 hours:  Temp:  [97.1 F (36.2 C)-99.4 F (37.4 C)] 97.1 F (36.2 C) (08/29 1029) Pulse Rate:  [60-86] 76 (08/29 1055) Resp:  [17-33] 27 (08/29 1055) BP: (101-135)/(36-73) 125/50 (08/29 1050) SpO2:  [92 %-100 %] 100 % (08/29 1050) FiO2 (%):  [40 %-70 %] 40 % (08/29 0854) Weight change:  Filed Weights   09/06/22 0054 09/06/22 0945 09/06/22 2200  Weight: 96.9 kg 93.3 kg 93.9 kg    Recent Labs  Lab 09/07/22 0523  NA 131*  K 3.7  CL 96*  CO2 24  GLUCOSE 272*  BUN 32*  CREATININE 6.96*  CALCIUM 7.7*    Recent Labs  Lab 09/06/22 0107 09/06/22 2013 09/07/22 0523  WBC 14.0* 19.7* 12.9*  NEUTROABS 11.2*  --   --   HGB 12.5* 13.7 11.6*  HCT 38.7* 43.0 35.4*  MCV 92.1 94.9 91.9  PLT 238 340 233    Scheduled Meds:  amiodarone  200 mg Oral BID   apixaban  5 mg Oral BID   atorvastatin  80 mg Oral QHS   azithromycin  500 mg Oral Daily   calcitRIOL  0.5 mcg Oral Q T,Th,Sa-HD   carvedilol  12.5 mg Oral BID   Chlorhexidine Gluconate Cloth  6 each Topical Q0600   cinacalcet  60 mg Oral Q T,Th,Sa-HD   erythromycin  1 Application Right Eye QHS   feeding supplement (NEPRO CARB STEADY)  237 mL Oral BID BM   fenofibrate  160 mg Oral Daily   gabapentin  600 mg Oral BID   insulin aspart  0-5 Units Subcutaneous QHS   insulin aspart  0-9 Units Subcutaneous TID WC   insulin glargine-yfgn  6 Units Subcutaneous Daily   lanthanum  1,000 mg Oral TID WC   lanthanum  500 mg Oral With snacks   methylPREDNISolone (SOLU-MEDROL) injection  60 mg Intravenous Q12H   midodrine  10 mg Oral Q T,Th,Sa-HD   multivitamin  1 tablet Oral QHS   nortriptyline  25 mg Oral QHS   tobramycin  1 drop Right Eye Q6H   Continuous Infusions:  sodium chloride     sodium chloride 250 mL (09/08/22 0937)   cefTRIAXone (ROCEPHIN)  IV 1 g (09/07/22 1847)   phenylephrine  (NEO-SYNEPHRINE) Adult infusion 25 mcg/min (09/07/22 0530)   PRN Meds:.acetaminophen **OR** acetaminophen, alteplase, azelastine, benzonatate, heparin, ipratropium-albuterol, olopatadine, ondansetron **OR** ondansetron (ZOFRAN) IV   Irena Cords,  MD 09/08/2022, 11:08 AM

## 2022-09-08 NOTE — Progress Notes (Signed)
Progress Note   Patient: Anthony Bullock:[id number] DOB: September 16, 1960 DOA: 09/06/2022     2 DOS: the patient was seen and examined on 09/08/2022   Brief hospital course: 62 year old male with a history of hypertension, hyperlipidemia, diabetes mellitus type 2, bilateral BKA, ESRD (TTS),HFrEF (EF 30-35%), anxiety/depression presenting with cough and shortness of breath that had worsened in the last 24 hours prior to admission.  Notably, the patient had a recent hospital admission from 08/18/2022 to 08/26/2022 for acute respiratory failure secondary to fluid overload.  The patient's fluid status was managed with hemodialysis.  He required extra hemodialysis sessions.  His hospitalization was prolonged secondary to new onset A-fib and new diagnosis of cardiomyopathy with EF 30-35%.  Cardiology was consulted.  He was on a diltiazem drip temporarily.  He converted to sinus rhythm.  His GDMT was limited secondary to intradialytic low blood pressures.  He was discharged home with carvedilol and apixaban. The patient has had a history of dietary indiscretion which led to his fluid overload.  However, he states that he has been compliant with his diet and fluid restriction since discharge from his last hospitalization.  He denies any fevers, chills, chest pain, headache, neck pain, hemoptysis.  He has a nonproductive cough.  He has some upper abdominal pain with coughing.  He had 1 episode of emesis on 09/04/2022, but this has resolved.  He had 1 loose stool on 09/04/2022.  This has resolved.  There is no hematochezia or melena. Because of his worsening shortness of breath and cough, he presented for further evaluation and treatment.  In the ED, the patient was afebrile.  He was hypoxic with oxygen saturation 89% room air.  He was hemodynamically stable.  He was placed on 2 L with saturation 95%.  WBC 14.0, hemoglobin 12.5, platelets 230,000.  Sodium 133, potassium 3.5, bicarbonate 24, serum creatinine of 10.06.  AST 26,  ALT 23, alk phosphatase 65, total bilirubin 0.7.  Chest x-ray showed venous congestion and chronic interstitial markings.  Nephrology was consulted to assist with management.  Assessment and Plan: 1-acute respiratory failure with hypoxia -Appears to be multifactorial in the setting of COVID-19 infection, pneumonia and fluid overload -Patient in the requiring the use of BiPAP to stabilize his breathing. -Continue current antibiotics, remdesivir, steroids, flutter valve, bronchodilators and supportive care -Hemodialysis to help with stabilizing patient's volume status -Continue mucolytics, vitamin C and zinc -Wean off oxygen supplementation and follow clinical response.  2-community-acquired pneumonia/COVID-19 infection -Elevated procalcitonin appreciated -Patient has been started on ceftriaxone and Zithromax -Continue remdesivir, steroids and follow inflammatory markers. -Vitamin C, zinc and mucolytics will be added to his regimen.  3-ESRD -Continue dialysis per nephrology service. -Planning for dialysis again today.  4-acute on chronic combined systolic and diastolic heart failure -Most recent ejection fraction 30 to 35% on August 22, 2022 -Continue daily weights, strict I's and O's and low sodium diet -Volume management with hemodialysis -Follow red sleep management -Patient GDMT is limited by low blood pressure -Continue beta-blocker.  5-paroxysmal atrial fibrillation -Continue apixaban and continue beta-blocker -Currently sinus rhythm. -Patient will be transition off amiodarone drip; acute A-fib with RVR most likely driven by acute infection (COVID and pneumonia). -Follow response; currently sinus rhythm on today's examination.  6-history of coronary artery disease -Patient reports no chest pain -Continue patient follow-up with cardiology service -Continue beta-blocker and apixaban.  7-history of COPD -Continue steroids and bronchodilator management as outlined above. -No  wheezing on exam.  8-peripheral arterial disease -Status post  bilateral BKA -Continue Eliquis -Continue the use of statins and rifaximin medication.  9-hypertension -Continue carvedilol -Soft blood pressure currently appreciated -Follow vital signs. -Wean off pressure support and if needed initiate the use of midodrine 3 times a day instead of 3 times a week.  10-uncontrolled type 2 diabetes -A1c 8.7 -Continue sliding scale insulin and Lantus -Anticipate elevated CBGs in the setting of a steroid usage. -Continue to follow blood sugar fluctuation and adjust hypoglycemic regimen.   Subjective:  No fever, no chest pain, no nausea vomiting.  Still experiencing difficulty breathing, short winded with minimal exertion and intermittently requiring the use of BiPAP to maintain saturation and alleviate work of breathing.  Vasopressors and amiodarone drip in place.  Physical Exam: Vitals:   09/08/22 1415 09/08/22 1426 09/08/22 1445 09/08/22 1738  BP: (!) 127/46  (!) 130/43   Pulse: 72 74 75   Resp: 20 (!) 23 (!) 22   Temp:   98 F (36.7 C) 98.1 F (36.7 C)  TempSrc:   Oral Oral  SpO2: 94% 93% 95%   Weight:      Height:       General exam: Alert, awake, oriented x 3; no fever, no chest pain.  Still requiring intermittent use of BiPAP, the use of pressure support and at time of evaluation still requiring amiodarone drip. Respiratory system: Positive rhonchi bilaterally; decreased breath sounds at the bases.  No wheezing.  No using accessory muscle. Cardiovascular system:RRR. No rubs or gallops.  No JVD. Gastrointestinal system: Abdomen is obese: Nondistended, soft and nontender. No organomegaly or masses felt. Normal bowel sounds heard. Central nervous system: No focal neurological deficits. Extremities: Status post BKA bilaterally Skin: No petechiae. Psychiatry: Judgement and insight appear normal. Mood & affect appropriate.   Latest data Reviewed: ABG: FiO2 80%; pH 7.41, CO2 40,  pO2 49, HCO3 25.4 Renal function panel: Sodium 132, potassium 4.4, chloride 95, bicarb 23, glucose 426, BUN 68, creatinine 0.49, phosphorus 6.1, albumin 2.6 and GFR 6 Anion gap: 14 CBC: White blood cells 11.8, hemoglobin 10.3 and platelet count 222K CRP: 2.9>> 1.7 D-dimer: 0.82>> 0.62   Family Communication: No family at bedside.  Disposition: Status is: Inpatient Remains inpatient appropriate because: Continue treatment with IV antibiotics, remdesivir and wean off BiPAP as tolerated.   Planned Discharge Destination: Home  CRITICAL CARE Performed by: Vassie Loll   Total critical care time: 50 minutes  Critical care time was exclusive of separately billable procedures and treating other patients.  Critical care was necessary to treat or prevent imminent or life-threatening deterioration.  Critical care was time spent personally by me on the following activities: development of treatment plan with patient and/or surrogate as well as nursing, discussions with consultants, evaluation of patient's response to treatment, examination of patient, obtaining history from patient or surrogate, ordering and performing treatments and interventions, ordering and review of laboratory studies, ordering and review of radiographic studies, pulse oximetry and re-evaluation of patient's condition.    Author: Vassie Loll, MD 09/08/2022 5:38 PM  For on call review [email or web address].

## 2022-09-08 NOTE — Progress Notes (Signed)
Per patient request patient placed back on BiPAP. RN aware.

## 2022-09-09 DIAGNOSIS — I480 Paroxysmal atrial fibrillation: Secondary | ICD-10-CM | POA: Diagnosis not present

## 2022-09-09 DIAGNOSIS — E785 Hyperlipidemia, unspecified: Secondary | ICD-10-CM

## 2022-09-09 DIAGNOSIS — N186 End stage renal disease: Secondary | ICD-10-CM | POA: Diagnosis not present

## 2022-09-09 DIAGNOSIS — U071 COVID-19: Secondary | ICD-10-CM | POA: Diagnosis not present

## 2022-09-09 DIAGNOSIS — J9601 Acute respiratory failure with hypoxia: Secondary | ICD-10-CM | POA: Diagnosis not present

## 2022-09-09 DIAGNOSIS — E1169 Type 2 diabetes mellitus with other specified complication: Secondary | ICD-10-CM

## 2022-09-09 LAB — GLUCOSE, CAPILLARY
Glucose-Capillary: 302 mg/dL — ABNORMAL HIGH (ref 70–99)
Glucose-Capillary: 420 mg/dL — ABNORMAL HIGH (ref 70–99)

## 2022-09-09 LAB — C-REACTIVE PROTEIN: CRP: 1 mg/dL — ABNORMAL HIGH (ref <1.0)

## 2022-09-09 LAB — D-DIMER, QUANTITATIVE: D-Dimer, Quant: 0.84 ug{FEU}/mL — ABNORMAL HIGH (ref 0.00–0.50)

## 2022-09-09 MED ORDER — HUMALOG KWIKPEN 200 UNIT/ML ~~LOC~~ SOPN: 2.0000 [IU] | PEN_INJECTOR | SUBCUTANEOUS | Status: DC

## 2022-09-09 MED ORDER — PREDNISONE 20 MG PO TABS: ORAL_TABLET | ORAL | 0 refills | Status: DC

## 2022-09-09 MED ORDER — CEFDINIR 300 MG PO CAPS: 300.0000 mg | ORAL_CAPSULE | Freq: Two times a day (BID) | ORAL | 0 refills | Status: AC

## 2022-09-09 MED ORDER — ASCORBIC ACID 1000 MG PO TABS: 1000.0000 mg | ORAL_TABLET | Freq: Every day | ORAL | 2 refills | Status: DC

## 2022-09-09 MED ORDER — PANTOPRAZOLE SODIUM 40 MG PO TBEC: 40.0000 mg | DELAYED_RELEASE_TABLET | Freq: Every day | ORAL | 1 refills | Status: DC

## 2022-09-09 MED ORDER — ZINC SULFATE 220 (50 ZN) MG PO CAPS: 220.0000 mg | ORAL_CAPSULE | Freq: Every day | ORAL | 2 refills | Status: DC

## 2022-09-09 MED ORDER — AMIODARONE HCL 200 MG PO TABS: 200.0000 mg | ORAL_TABLET | Freq: Every day | ORAL | 0 refills | Status: DC

## 2022-09-09 NOTE — Progress Notes (Addendum)
Patient ID: Anthony Bullock, male   DOB: 05-30-1960, 62 y.o.   MRN: [id number] S: Feels much better today. O:BP (!) 122/42   Pulse 81   Temp 97.7 F (36.5 C) (Oral)   Resp 20   Ht 5\' 11"  (1.803 m)   Wt 90.4 kg   SpO2 96%   BMI 27.80 kg/m   Intake/Output Summary (Last 24 hours) at 09/09/2022 1129 Last data filed at 09/09/2022 6213 Gross per 24 hour  Intake 1087.27 ml  Output 3000 ml  Net -1912.73 ml   Intake/Output: I/O last 3 completed shifts: In: 116 [I.V.:16; IV Piggyback:100] Out: 3000 [Other:3000]  Intake/Output this shift:  Total I/O In: 971.3 [P.O.:240; I.V.:631.3; IV Piggyback:100] Out: -  Weight change:  Gen: NAD CVS: RRR Resp: CTA Abd: +BS, soft, NT/ND Ext: s/p bilateral BKA's, no edema  Recent Labs  Lab 09/02/22 1143 09/06/22 0107 09/06/22 2013 09/07/22 0523 09/08/22 1038  NA 133* 133* 130* 131* 132*  K 3.5 3.5 4.2 3.7 4.4  CL 94* 93* 93* 96* 95*  CO2 26 24 21* 24 23  GLUCOSE 178* 140* 339* 272* 426*  BUN 34* 49* 59* 32* 68*  CREATININE 7.54* 10.06* 11.71* 6.96* 9.49*  ALBUMIN  --  3.2*  --  2.6* 2.6*  CALCIUM 7.8* 8.7* 7.8* 7.7* 7.8*  PHOS  --   --   --   --  6.1*  AST  --  27  --  22  --   ALT  --  23  --  20  --    Liver Function Tests: Recent Labs  Lab 09/06/22 0107 09/07/22 0523 09/08/22 1038  AST 27 22  --   ALT 23 20  --   ALKPHOS 65 51  --   BILITOT 0.7 0.7  --   PROT 7.2 6.4*  --   ALBUMIN 3.2* 2.6* 2.6*   No results for input(s): "LIPASE", "AMYLASE" in the last 168 hours. No results for input(s): "AMMONIA" in the last 168 hours. CBC: Recent Labs  Lab 09/06/22 0107 09/06/22 2013 09/07/22 0523 09/08/22 1038  WBC 14.0* 19.7* 12.9* 11.8*  NEUTROABS 11.2*  --   --   --   HGB 12.5* 13.7 11.6* 10.3*  HCT 38.7* 43.0 35.4* 32.2*  MCV 92.1 94.9 91.9 93.9  PLT 238 340 233 222   Cardiac Enzymes: No results for input(s): "CKTOTAL", "CKMB", "CKMBINDEX", "TROPONINI" in the last 168 hours. CBG: Recent Labs  Lab 09/08/22 0743  09/08/22 1129 09/08/22 1627 09/08/22 2253 09/09/22 0800  GLUCAP 320* 296* 320* 268* 302*    Iron Studies: No results for input(s): "IRON", "TIBC", "TRANSFERRIN", "FERRITIN" in the last 72 hours. Studies/Results: No results found.  amiodarone  200 mg Oral BID   apixaban  5 mg Oral BID   ascorbic acid  1,000 mg Oral Daily   atorvastatin  80 mg Oral QHS   azithromycin  500 mg Oral Daily   calcitRIOL  0.5 mcg Oral Q T,Th,Sa-HD   carvedilol  12.5 mg Oral BID   Chlorhexidine Gluconate Cloth  6 each Topical Q0600   cinacalcet  60 mg Oral Q T,Th,Sa-HD   erythromycin  1 Application Right Eye QHS   feeding supplement (NEPRO CARB STEADY)  237 mL Oral BID BM   fenofibrate  160 mg Oral Daily   gabapentin  600 mg Oral BID   insulin aspart  0-5 Units Subcutaneous QHS   insulin aspart  0-9 Units Subcutaneous TID WC   insulin glargine-yfgn  10 Units Subcutaneous Daily   lanthanum  1,000 mg Oral TID WC   lanthanum  500 mg Oral With snacks   methylPREDNISolone (SOLU-MEDROL) injection  60 mg Intravenous Q12H   midodrine  10 mg Oral Q T,Th,Sa-HD   multivitamin  1 tablet Oral QHS   nortriptyline  25 mg Oral QHS   tobramycin  1 drop Right Eye Q6H   zinc sulfate  220 mg Oral Daily    BMET    Component Value Date/Time   NA 132 (L) 09/08/2022 1038   K 4.4 09/08/2022 1038   CL 95 (L) 09/08/2022 1038   CO2 23 09/08/2022 1038   GLUCOSE 426 (H) 09/08/2022 1038   BUN 68 (H) 09/08/2022 1038   BUN 75 (A) 11/07/2017 0000   CREATININE 9.49 (H) 09/08/2022 1038   CALCIUM 7.8 (L) 09/08/2022 1038   CALCIUM 13.6 (HH) 06/04/2015 1300   GFRNONAA 6 (L) 09/08/2022 1038   GFRAA 8 (L) 09/04/2019 0420   CBC    Component Value Date/Time   WBC 11.8 (H) 09/08/2022 1038   RBC 3.43 (L) 09/08/2022 1038   HGB 10.3 (L) 09/08/2022 1038   HCT 32.2 (L) 09/08/2022 1038   PLT 222 09/08/2022 1038   MCV 93.9 09/08/2022 1038   MCH 30.0 09/08/2022 1038   MCHC 32.0 09/08/2022 1038   RDW 16.8 (H) 09/08/2022 1038    LYMPHSABS 1.2 09/06/2022 0107   MONOABS 1.1 (H) 09/06/2022 0107   EOSABS 0.3 09/06/2022 0107   BASOSABS 0.1 09/06/2022 0107    outpatient HD orders: [facility], TTS.  EDW 91.5 kg.  TDC.  Flow rates: 400/500.  1K/2.5 calcium.  No heparin (only receives heparin locks).  Meds: Sensipar to 60 mg q. treatment, calcitriol 0.5 mcg q. treatment, Mircera 30 mcg every 4 weeks    Assessment/Plan:   SOB, Covid+ with sepsis- transferred to ICU due to worsening respiratory status.  Improved after HD with 2.5 L UF.  Given remdesivir per primary along with solumedrol. Currently on 3L [place].  Feels much better and off of pressors. ESRD - continue with HD on TTS schedule.  HD tomorrow if still here, otherwise will f/u with outpatient HD unit. Volume/HTN - UF as tolerated with HD. Anemia of ESKD - no indication for ESA at this time. Secondary Hyperparathyroidism -  resume home binders along with calcitriol and sensipar. Disposition - possible discharge to home per primary svc.   Irena Cords, MD [facility]  The patient will not be physically seen over the weekend, however labs and notes will be reviewed remotely and on-call coverage is available if needed.

## 2022-09-09 NOTE — Discharge Summary (Signed)
Physician Discharge Summary   Patient: Anthony Bullock MRN: [id number] DOB: September 25, 1960  Admit date:     09/06/2022  Discharge date: 09/09/22  Discharge Physician: Vassie Loll   PCP: Kristian Covey, MD   Recommendations at discharge:  Repeat CBC to follow hemoglobin trend/stability Repeat chest x-ray in 6 weeks to assure complete resolution of infiltrates Continue to closely follow patient's CBGs with further adjustment to hypoglycemic regimen as needed Outpatient follow-up with nephrology and cardiology as previously recommended.  Discharge Diagnoses: Principal Problem:   Acute respiratory failure with hypoxia (HCC) Active Problems:   ESRD on dialysis [facility])   Atrial fibrillation (HCC)   Below-knee amputation of left lower extremity (HCC)   Pneumonia due to COVID-19 virus   Acute on chronic HFrEF (heart failure with reduced ejection fraction) [facility])  Hospital Course: 62 year old male with a history of hypertension, hyperlipidemia, diabetes mellitus type 2, bilateral BKA, ESRD (TTS),HFrEF (EF 30-35%), anxiety/depression presenting with cough and shortness of breath that had worsened in the last 24 hours prior to admission.  Notably, the patient had a recent hospital admission from 08/18/2022 to 08/26/2022 for acute respiratory failure secondary to fluid overload.  The patient's fluid status was managed with hemodialysis.  He required extra hemodialysis sessions.  His hospitalization was prolonged secondary to new onset A-fib and new diagnosis of cardiomyopathy with EF 30-35%.  Cardiology was consulted.  He was on a diltiazem drip temporarily.  He converted to sinus rhythm.  His GDMT was limited secondary to intradialytic low blood pressures.  He was discharged home with carvedilol and apixaban. The patient has had a history of dietary indiscretion which led to his fluid overload.  However, he states that he has been compliant with his diet and fluid restriction since discharge from his last  hospitalization.  He denies any fevers, chills, chest pain, headache, neck pain, hemoptysis.  He has a nonproductive cough.  He has some upper abdominal pain with coughing.  He had 1 episode of emesis on 09/04/2022, but this has resolved.  He had 1 loose stool on 09/04/2022.  This has resolved.  There is no hematochezia or melena. Because of his worsening shortness of breath and cough, he presented for further evaluation and treatment.  In the ED, the patient was afebrile.  He was hypoxic with oxygen saturation 89% room air.  He was hemodynamically stable.  He was placed on 2 L with saturation 95%.  WBC 14.0, hemoglobin 12.5, platelets 230,000.  Sodium 133, potassium 3.5, bicarbonate 24, serum creatinine of 10.06.  AST 26, ALT 23, alk phosphatase 65, total bilirubin 0.7.  Chest x-ray showed venous congestion and chronic interstitial markings.  Nephrology was consulted to assist with management.  Assessment and Plan: 1-acute respiratory failure with hypoxia -Appears to be multifactorial in the setting of COVID-19 infection, pneumonia and fluid overload -Patient in the requiring the use of BiPAP to stabilize his breathing. -Continue current antibiotics, remdesivir, steroids, flutter valve, bronchodilators and supportive care -Hemodialysis to help with stabilizing patient's volume status -Continue mucolytics, vitamin C and zinc -Wean off oxygen supplementation and follow clinical response.   2-community-acquired pneumonia/COVID-19 infection -Elevated procalcitonin appreciated -Patient has been started on ceftriaxone and Zithromax -Continue remdesivir, steroids and follow inflammatory markers. -Vitamin C, zinc and mucolytics will be added to his regimen.   3-ESRD -Continue dialysis per nephrology service. -Planning for dialysis again today.   4-acute on chronic combined systolic and diastolic heart failure -Most recent ejection fraction 30 to 35% on August 22, 2022 -  Continue daily weights, strict  I's and O's and low sodium diet -Volume management with hemodialysis -Follow red sleep management -Patient GDMT is limited by low blood pressure -Continue beta-blocker.   5-paroxysmal atrial fibrillation -Continue apixaban and continue beta-blocker -Currently sinus rhythm. -Patient will be transition off amiodarone drip; acute A-fib with RVR most likely driven by acute infection (COVID and pneumonia). -Follow response; currently sinus rhythm on today's examination.   6-history of coronary artery disease -Patient reports no chest pain -Continue outpatient follow-up with cardiology service -Continue beta-blocker and apixaban.   7-history of COPD -Continue steroids and bronchodilator management as outlined above. -No wheezing on exam.   8-peripheral arterial disease -Status post bilateral BKA -Continue Eliquis -Continue the use of statins and rifaximin medication.   9-hypertension -Continue carvedilol -Soft blood pressure currently appreciated -Follow vital signs. -patient successfully weaned off pressors.    10-uncontrolled type 2 diabetes -A1c 8.7 -Continue sliding scale insulin and Lantus -Anticipate elevated CBGs in the setting of a steroid usage. -Continue to follow blood sugar fluctuation and adjust hypoglycemic regimen.   Consultants: nephrology service. Procedures performed: See below for x-ray reports. Disposition: Home Diet recommendation: Modified carbohydrates and low sodium diet.  DISCHARGE MEDICATION: Allergies as of 09/09/2022       Reactions   Epoetin Alfa Other (See Comments)   unknown   Ferumoxytol Other (See Comments)   unknown   Morphine Sulfate Rash, Other (See Comments)   Itches all over, red spots        Medication List     TAKE these medications    acetaminophen 325 MG tablet Commonly known as: TYLENOL Take 1-2 tablets (325-650 mg total) by mouth every 4 (four) hours as needed for mild pain.   amiodarone 200 MG tablet Commonly  known as: PACERONE Take 1 tablet (200 mg total) by mouth daily for 10 days.   ascorbic acid 1000 MG tablet Commonly known as: VITAMIN C Take 1 tablet (1,000 mg total) by mouth daily. Start taking on: September 10, 2022   atorvastatin 80 MG tablet Commonly known as: LIPITOR TAKE 1 TABLET BY MOUTH AT BEDTIME   azelastine 0.1 % nasal spray Commonly known as: Astelin Place 2 sprays into both nostrils 3 (three) times daily as needed for rhinitis. Use in each nostril as directed   BD Pen Needle Nano U/F 32G X 4 MM Misc Generic drug: Insulin Pen Needle USE 1 PEN NEEDLE SUBCUTANEOUSLY WITH INSULIN 4 TIMES DAILY   benzonatate 100 MG capsule Commonly known as: Tessalon Perles Take 1 capsule (100 mg total) by mouth 3 (three) times daily as needed for cough.   carvedilol 12.5 MG tablet Commonly known as: COREG Take 1 tablet (12.5 mg total) by mouth 2 (two) times daily.   cefdinir 300 MG capsule Commonly known as: OMNICEF Take 1 capsule (300 mg total) by mouth 2 (two) times daily for 4 days.   cinacalcet 30 MG tablet Commonly known as: SENSIPAR Take 1 tablet (30 mg total) by mouth Every Tuesday,Thursday,and Saturday with dialysis.   Eliquis 5 MG Tabs tablet Generic drug: apixaban Take 1 tablet (5 mg total) by mouth 2 (two) times daily.   erythromycin ophthalmic ointment Place 1 Application into the right eye at bedtime.   fenofibrate 160 MG tablet TAKE 1 TABLET BY MOUTH ONCE DAILY   fexofenadine 180 MG tablet Commonly known as: ALLEGRA Take 180 mg by mouth daily as needed for allergies.   FreeStyle Libre 14 Day Sensor Misc USE AS DIRECTED EVERY 14  DAYS   FreeStyle Libre 3 Sensor Misc Place 1 sensor on the skin every 14 days. Use to check glucose continuously   FreeStyle Libre 3 Reader Devi Use to check blood glucose TID   gabapentin 300 MG capsule Commonly known as: NEURONTIN TAKE 2 CAPSULES BY MOUTH TWICE DAILY . What changed:  how much to take how to take this when  to take this additional instructions   glucose blood test strip Check 1 time daily. E11.9 One Touch Ultra Blue Test Strips   HumaLOG KwikPen 200 UNIT/ML KwikPen Generic drug: insulin lispro Inject 2-28 Units into the skin See admin instructions. INJECT A MAXIMUM OF 28 UNITS SUBCUTANEOUSLY TWICE DAILY WITH LUNCH AND SUPPER PER SLIDING SCALE. APPOINTMENT REQUIRED FOR FUTURE REFILLS   lanthanum 500 MG chewable tablet Commonly known as: FOSRENOL Chew 500-1,000 mg by mouth See admin instructions. Take 1000 mg with meals three time a day and 500 mg with snacks   Lantus SoloStar 100 UNIT/ML Solostar Pen Generic drug: insulin glargine INJECT 12 UNITS SUBCUTANEOUSLY AT BEDTIME What changed: See the new instructions.   midodrine 10 MG tablet Commonly known as: PROAMATINE Take 10 mg by mouth 3 (three) times a week.   multivitamin Tabs tablet Take 1 tablet by mouth once daily   nortriptyline 25 MG capsule Commonly known as: PAMELOR Take 1 capsule (25 mg total) by mouth at bedtime.   Olopatadine HCl 0.2 % Soln Place 1 drop into both eyes daily as needed (for allergies).   pantoprazole 40 MG tablet Commonly known as: Protonix Take 1 tablet (40 mg total) by mouth daily.   predniSONE 20 MG tablet Commonly known as: DELTASONE Take 3 tablets by mouth x 1 day; then 2 tablets by mouth x 2 days; then 1 tablet by mouth x 3 days; then half tablet by mouth x 3 days and stop prednisone.   tobramycin 0.3 % ophthalmic solution Commonly known as: TOBREX Place 1 drop into the right eye every 6 (six) hours.   Ventolin HFA 108 (90 Base) MCG/ACT inhaler Generic drug: albuterol Inhale 1-2 puffs into the lungs every 6 (six) hours as needed for wheezing or shortness of breath.   zinc sulfate 220 (50 Zn) MG capsule Take 1 capsule (220 mg total) by mouth daily. Start taking on: September 10, 2022        Follow-up Information     Burchette, Elberta Fortis, MD. Schedule an appointment as soon as possible  for a visit in 10 day(s).   Specialty: Family Medicine Contact information: [address] [place] [address] [phone]                Discharge Exam: Filed Weights   09/06/22 0945 09/06/22 2200 09/09/22 0525  Weight: 93.3 kg 93.9 kg 90.4 kg   General exam: Alert, awake, oriented x 3; no fever, no chest pain.  No further use of BiPAP or oxygen supplementation prior to discharge. Speaking in full sentences. Respiratory system: Positive scattered rhonchi; improved air movement bilaterally.  No using accessory muscles.  Good saturation on room air. Cardiovascular system:RRR. No rubs or gallops.  No JVD. Gastrointestinal system: Abdomen is obese: Nondistended, soft and nontender. No organomegaly or masses felt. Normal bowel sounds heard. Central nervous system: No focal neurological deficits. Extremities: Status post BKA bilaterally Skin: No petechiae. Psychiatry: Judgement and insight appear normal. Mood & affect appropriate.   Condition at discharge: stable  The results of significant diagnostics from this hospitalization (including imaging, microbiology, ancillary and laboratory)  are listed below for reference.   Imaging Studies: DG Chest 1 View  Result Date: 09/07/2022 CLINICAL DATA:  Shortness of breath following dialysis this morning, initial encounter EXAM: PORTABLE CHEST 1 VIEW COMPARISON:  09/06/2022 FINDINGS: Cardiac shadow is stable. Defibrillator and dialysis catheter are again seen. Diffuse vascular congestion and right-sided effusion are seen. IMPRESSION: Stable CHF Electronically Signed   By: Alcide Clever M.D.   On: 09/07/2022 03:35   DG Chest 1 View  Result Date: 09/06/2022 CLINICAL DATA:  Shortness of breath and COVID-19 positivity EXAM: PORTABLE CHEST 1 VIEW COMPARISON:  09/06/2022 FINDINGS: Cardiac shadow is stable. Defibrillator is again noted. Dialysis catheter is again seen. Diffuse central vascular congestion is noted significantly increased  when compared with the prior exam consistent with worsening CHF. New right effusion is noted. No bony abnormality is seen. IMPRESSION: Worsening CHF. Electronically Signed   By: Alcide Clever M.D.   On: 09/06/2022 21:48   DG Chest Portable 1 View  Result Date: 09/06/2022 CLINICAL DATA:  Shortness of breath EXAM: PORTABLE CHEST 1 VIEW COMPARISON:  08/25/2022 FINDINGS: Defibrillator is again noted. Cardiac shadow is stable. Dialysis catheter is seen in satisfactory position. The lungs are well aerated. No focal infiltrate is noted. Mild central vascular congestion is seen. No effusions are noted. IMPRESSION: Mild vascular congestion stable from the prior study. Electronically Signed   By: Alcide Clever M.D.   On: 09/06/2022 03:05   NM Myocar Multi W/Spect Izetta Dakin Motion / EF  Result Date: 08/25/2022 CLINICAL DATA:  Heart failure. EXAM: MYOCARDIAL IMAGING WITH SPECT (REST AND PHARMACOLOGIC-STRESS) GATED LEFT VENTRICULAR WALL MOTION STUDY LEFT VENTRICULAR EJECTION FRACTION TECHNIQUE: Standard myocardial SPECT imaging was performed after resting intravenous injection of 10.7 mCi Tc-13m tetrofosmin. Subsequently, intravenous infusion of Lexiscan was performed under the supervision of the Cardiology staff. At peak effect of the drug, 32.3 mCi Tc-41m tetrofosmin was injected intravenously and standard myocardial SPECT imaging was performed. Quantitative gated imaging was also performed to evaluate left ventricular wall motion, and estimate left ventricular ejection fraction. COMPARISON:  None Available. FINDINGS: Perfusion: There is a large, fixed defect involving the basal inferolateral, mid inferolateral, mid anterolateral and apicolateral segments. Imaging findings are concerning for infarction involving the left circumflex coronary artery. Wall Motion: There is markedly diminished global left ventricular wall motion with left ventricular dilatation Left Ventricular Ejection Fraction: 18 % End diastolic volume 175  ml End systolic volume 144 ml IMPRESSION: 1. There is a large in size, severe, fixed defect involving the basal inferolateral, mid inferolateral, mid anterolateral and apical lateral segments. This is consistent with infarction involving the left circumflex coronary artery. 2. Globally diminished left ventricular wall motion and left ventricular dilatation. 3. Left ventricular ejection fraction 18% 4. Non invasive risk stratification*: High *2012 Appropriate Use Criteria for Coronary Revascularization Focused Update: J Am Coll Cardiol. 2012;59(9):857-881. http://content.dementiazones.com.aspx?articleid=[email or web address] Electronically Signed   By: Signa Kell M.D.   On: 08/25/2022 14:28   DG CHEST PORT 1 VIEW  Result Date: 08/25/2022 CLINICAL DATA:  In stage kidney disease with volume access. EXAM: PORTABLE CHEST 1 VIEW COMPARISON:  07/19/2022 FINDINGS: Unchanged position of left-sided ICD lead. There is a right chest wall dialysis catheter with tips in the superior cavoatrial junction and right atrium. Heart size is normal. Pulmonary vascular congestion is noted. No frank interstitial edema, pleural effusions or airspace disease. IMPRESSION: Pulmonary vascular congestion without frank interstitial edema. Electronically Signed   By: Signa Kell M.D.   On: 08/25/2022 10:28   ECHOCARDIOGRAM  COMPLETE  Result Date: 08/22/2022    ECHOCARDIOGRAM REPORT   Patient Name:   AASON BENOIT [facility] Date of Exam: 08/22/2022 Medical Rec #:  [id number]     Height:       71.0 in Accession #:    [id number]    Weight:       205.5 lb Date of Birth:  01/27/1960     BSA:          2.133 m Patient Age:    61 years      BP:           115/64 mmHg Patient Gender: M             HR:           84 bpm. Exam Location:  Inpatient Procedure: 2D Echo, Color Doppler, Cardiac Doppler and Intracardiac            Opacification Agent Indications:    Dyspnea  History:        Patient has prior history of Echocardiogram examinations, most                  recent 09/03/2019. Cardiomyopathy and CHF, CAD, COPD; Risk                 Factors:Former Smoker, Diabetes, Dyslipidemia and Hypertension.                 ESRD. History of Cardiac Catheterization.  Sonographer:    Raeford Razor RDCS Referring Phys: [id number] MAURICIO DANIEL ARRIEN IMPRESSIONS  1. Left ventricular ejection fraction, by estimation, is 30 to 35%. The left ventricle has moderately decreased function. The left ventricle demonstrates global hypokinesis. There is moderate concentric left ventricular hypertrophy. Left ventricular diastolic parameters are consistent with Grade II diastolic dysfunction (pseudonormalization). Elevated left ventricular end-diastolic pressure.  2. Right ventricular systolic function is normal. The right ventricular size is normal.  3. Left atrial size was mildly dilated.  4. The mitral valve is normal in structure. Moderate mitral valve regurgitation. No evidence of mitral stenosis.  5. The aortic valve is normal in structure. Aortic valve regurgitation is not visualized. Aortic valve sclerosis is present, with no evidence of aortic valve stenosis.  6. The inferior vena cava is normal in size with greater than 50% respiratory variability, suggesting right atrial pressure of 3 mmHg. FINDINGS  Left Ventricle: Left ventricular ejection fraction, by estimation, is 30 to 35%. The left ventricle has moderately decreased function. The left ventricle demonstrates global hypokinesis. Definity contrast agent was given IV to delineate the left ventricular endocardial borders. The left ventricular internal cavity size was normal in size. There is moderate concentric left ventricular hypertrophy. Left ventricular diastolic parameters are consistent with Grade II diastolic dysfunction (pseudonormalization). Elevated left ventricular end-diastolic pressure. Right Ventricle: The right ventricular size is normal. No increase in right ventricular wall thickness. Right ventricular systolic function is  normal. Left Atrium: Left atrial size was mildly dilated. Right Atrium: Right atrial size was normal in size. Pericardium: There is no evidence of pericardial effusion. Presence of epicardial fat layer. Mitral Valve: The mitral valve is normal in structure. Moderate mitral valve regurgitation. No evidence of mitral valve stenosis. Tricuspid Valve: The tricuspid valve is normal in structure. Tricuspid valve regurgitation is not demonstrated. No evidence of tricuspid stenosis. Aortic Valve: The aortic valve is normal in structure. Aortic valve regurgitation is not visualized. Aortic valve sclerosis is present, with no evidence of aortic valve stenosis. Aortic valve peak gradient measures  2.4 mmHg. Pulmonic Valve: The pulmonic valve was not well visualized. Pulmonic valve regurgitation is trivial. No evidence of pulmonic stenosis. Aorta: The aortic root is normal in size and structure. Venous: The inferior vena cava is normal in size with greater than 50% respiratory variability, suggesting right atrial pressure of 3 mmHg. IAS/Shunts: No atrial level shunt detected by color flow Doppler.  LEFT VENTRICLE PLAX 2D LVIDd:         6.60 cm      Diastology LVIDs:         5.40 cm      LV e' medial:    5.66 cm/s LV PW:         1.40 cm      LV E/e' medial:  20.0 LV IVS:        1.40 cm      LV e' lateral:   6.53 cm/s LVOT diam:     2.10 cm      LV E/e' lateral: 17.3 LV SV:         48 LV SV Index:   22 LVOT Area:     3.46 cm  LV Volumes (MOD) LV vol d, MOD A2C: 110.0 ml LV vol d, MOD A4C: 195.0 ml LV vol s, MOD A2C: 71.9 ml LV vol s, MOD A4C: 134.0 ml LV SV MOD A2C:     38.1 ml LV SV MOD A4C:     195.0 ml LV SV MOD BP:      48.3 ml RIGHT VENTRICLE            IVC RV Basal diam:  2.70 cm    IVC diam: 2.00 cm RV S prime:     6.96 cm/s TAPSE (M-mode): 2.9 cm LEFT ATRIUM             Index        RIGHT ATRIUM           Index LA diam:        3.70 cm 1.73 cm/m   RA Area:     12.50 cm LA Vol (A2C):   82.8 ml 38.82 ml/m  RA Volume:    25.70 ml  12.05 ml/m LA Vol (A4C):   62.4 ml 29.26 ml/m LA Biplane Vol: 73.0 ml 34.23 ml/m  AORTIC VALVE AV Area (Vmax): 3.13 cm AV Vmax:        77.00 cm/s AV Peak Grad:   2.4 mmHg LVOT Vmax:      69.60 cm/s LVOT Vmean:     47.700 cm/s LVOT VTI:       0.138 m  AORTA Ao Root diam: 2.70 cm Ao Asc diam:  3.30 cm MITRAL VALVE MV Area (PHT): 6.37 cm     SHUNTS MV Decel Time: 119 msec     Systemic VTI:  0.14 m MR PISA:        1.57 cm    Systemic Diam: 2.10 cm MR PISA Radius: 0.50 cm MV E velocity: 113.00 cm/s MV A velocity: 72.30 cm/s MV E/A ratio:  1.56 Kardie Tobb DO Electronically signed by Thomasene Ripple DO Signature Date/Time: 08/22/2022/3:51:04 PM    Final    DG Chest 1 View  Result Date: 08/20/2022 CLINICAL DATA:  Dyspnea. EXAM: CHEST  1 VIEW COMPARISON:  Chest radiograph dated 08/19/2022. FINDINGS: Right-sided dialysis catheter and left AICD device in similar position. There is mild cardiomegaly with vascular congestion and edema relatively similar to prior radiograph. Trace bilateral pleural effusions may be present. No pneumothorax.  Atherosclerotic calcification of the aorta. No acute osseous pathology. IMPRESSION: Overall no significant interval change. Electronically Signed   By: Elgie Collard M.D.   On: 08/20/2022 18:18   DG Chest 1 View  Result Date: 08/19/2022 CLINICAL DATA:  Dyspnea EXAM: CHEST  1 VIEW COMPARISON:  X-ray 07/18/2022 and older FINDINGS: Stable double lumen right IJ catheter with tip along the SVC right atrial junction region. Left-sided defibrillator. Overlapping cardiac leads. Stable cardiopericardial silhouette with central vascular congestion and interstitial changes. No pneumothorax or large effusion. Overlapping cardiac leads. IMPRESSION: No significant interval change when adjusting for technique. Electronically Signed   By: Karen Kays M.D.   On: 08/19/2022 17:10    Microbiology: Results for orders placed or performed during the hospital encounter of 09/06/22  MRSA  Next Gen by PCR, Nasal     Status: None   Collection Time: 09/04/22  7:48 AM   Specimen: Nasal Mucosa; Nasal Swab  Result Value Ref Range Status   MRSA by PCR Next Gen NOT DETECTED NOT DETECTED Final    Comment: (NOTE) The GeneXpert MRSA Assay (FDA approved for NASAL specimens only), is one component of a comprehensive MRSA colonization surveillance program. It is not intended to diagnose MRSA infection nor to guide or monitor treatment for MRSA infections. Test performance is not FDA approved in patients less than 25 years old. Performed at [facility], [address] [place], [place] [address]   SARS Coronavirus 2 by RT PCR (hospital order, performed in [facility] hospital lab) *cepheid single result test* Anterior Nasal Swab     Status: Abnormal   Collection Time: 09/06/22  2:23 AM   Specimen: Anterior Nasal Swab  Result Value Ref Range Status   SARS Coronavirus 2 by RT PCR POSITIVE (A) NEGATIVE Final    Comment: (NOTE) SARS-CoV-2 target nucleic acids are DETECTED  SARS-CoV-2 RNA is generally detectable in upper respiratory specimens  during the acute phase of infection.  Positive results are indicative  of the presence of the identified virus, but do not rule out bacterial infection or co-infection with other pathogens not detected by the test.  Clinical correlation with patient history and  other diagnostic information is necessary to determine patient infection status.  The expected result is negative.  Fact Sheet for Patients:   RoadLapTop.co.za   Fact Sheet for Healthcare Providers:   http://kim-miller.com/    This test is not yet approved or cleared by the [place] FDA and  has been authorized for detection and/or diagnosis of SARS-CoV-2 by FDA under an Emergency Use Authorization (EUA).  This EUA will remain in effect (meaning this test can be used) for the duration of  the COVID-19 declaration under Section 564(b)(1)   of the Act, 21 U.S.C. section 360-bbb-3(b)(1), unless the authorization is terminated or revoked sooner.   Performed at [facility], [address] [place], [place] [address]    *Note: Due to a large number of results and/or encounters for the requested time period, some results have not been displayed. A complete set of results can be found in Results Review.    Labs: CBC: Recent Labs  Lab 09/06/22 0107 09/06/22 2013 09/07/22 0523 09/08/22 1038  WBC 14.0* 19.7* 12.9* 11.8*  NEUTROABS 11.2*  --   --   --   HGB 12.5* 13.7 11.6* 10.3*  HCT 38.7* 43.0 35.4* 32.2*  MCV 92.1 94.9 91.9 93.9  PLT 238 340 233 222   Basic Metabolic Panel: Recent Labs  Lab 09/06/22 0107  09/06/22 2013 09/07/22 0523 09/08/22 1038  NA 133* 130* 131* 132*  K 3.5 4.2 3.7 4.4  CL 93* 93* 96* 95*  CO2 24 21* 24 23  GLUCOSE 140* 339* 272* 426*  BUN 49* 59* 32* 68*  CREATININE 10.06* 11.71* 6.96* 9.49*  CALCIUM 8.7* 7.8* 7.7* 7.8*  PHOS  --   --   --  6.1*   Liver Function Tests: Recent Labs  Lab 09/06/22 0107 09/07/22 0523 09/08/22 1038  AST 27 22  --   ALT 23 20  --   ALKPHOS 65 51  --   BILITOT 0.7 0.7  --   PROT 7.2 6.4*  --   ALBUMIN 3.2* 2.6* 2.6*   CBG: Recent Labs  Lab 09/08/22 1129 09/08/22 1627 09/08/22 2253 09/09/22 0800 09/09/22 1316  GLUCAP 296* 320* 268* 302* 420*    Discharge time spent: greater than 30 minutes.  Signed: Vassie Loll, MD [facility] 09/09/2022

## 2022-09-09 NOTE — Inpatient Diabetes Management (Signed)
Inpatient Diabetes Program Recommendations  AACE/ADA: New Consensus Statement on Inpatient Glycemic Control (2015)  Target Ranges:  Prepandial:   less than 140 mg/dL      Peak postprandial:   less than 180 mg/dL (1-2 hours)      Critically ill patients:  140 - 180 mg/dL    Latest Reference Range & Units 09/08/22 07:43 09/08/22 11:29 09/08/22 16:27 09/08/22 22:53  Glucose-Capillary 70 - 99 mg/dL 725 (H)  7 units Novolog  296 (H)  5 units Novolog  320 (H)  7 units Novolog  268 (H)  3 units Novolog  10 units Semglee  (H): Data is abnormally high  Latest Reference Range & Units 09/09/22 08:00  Glucose-Capillary 70 - 99 mg/dL 366 (H)  (H): Data is abnormally high    Home DM Meds: Lantus 14 units at bedtime      Humalog 2-28 units BID (with lunch and supper)   Current Orders: Semglee 10 units at bedtime     Novolog Sensitive Correction Scale/ SSI (0-9 units) TID AC + HS     MD- Note pt remains on Solumedrol 60 mg BID  Getting Solid PO Diet + Nepro PO supps BID  Note CBG 302 this AM  Please consider:  1. Increase Semglee to 15 units at bedtime  2. Start Novolog Meal Coverage: Novolog 4 units TID with meals HOLD if pt NPO HOLD if pt eats <50% meals     --Will follow patient during hospitalization--  Ambrose Finland RN, MSN, CDCES Diabetes Coordinator Inpatient Glycemic Control Team Team Pager: [phone] (8a-5p)

## 2022-09-10 DIAGNOSIS — N250 Renal osteodystrophy: Secondary | ICD-10-CM | POA: Diagnosis not present

## 2022-09-10 DIAGNOSIS — N186 End stage renal disease: Secondary | ICD-10-CM | POA: Diagnosis not present

## 2022-09-10 DIAGNOSIS — N2581 Secondary hyperparathyroidism of renal origin: Secondary | ICD-10-CM | POA: Diagnosis not present

## 2022-09-10 DIAGNOSIS — D631 Anemia in chronic kidney disease: Secondary | ICD-10-CM | POA: Diagnosis not present

## 2022-09-10 DIAGNOSIS — Z992 Dependence on renal dialysis: Secondary | ICD-10-CM | POA: Diagnosis not present

## 2022-09-13 ENCOUNTER — Telehealth: Payer: Self-pay | Admitting: Family Medicine

## 2022-09-13 ENCOUNTER — Telehealth: Payer: Self-pay

## 2022-09-13 ENCOUNTER — Encounter: Payer: Self-pay | Admitting: Family Medicine

## 2022-09-13 DIAGNOSIS — Z992 Dependence on renal dialysis: Secondary | ICD-10-CM | POA: Diagnosis not present

## 2022-09-13 DIAGNOSIS — N250 Renal osteodystrophy: Secondary | ICD-10-CM | POA: Diagnosis not present

## 2022-09-13 DIAGNOSIS — N2581 Secondary hyperparathyroidism of renal origin: Secondary | ICD-10-CM | POA: Diagnosis not present

## 2022-09-13 DIAGNOSIS — D509 Iron deficiency anemia, unspecified: Secondary | ICD-10-CM | POA: Diagnosis not present

## 2022-09-13 DIAGNOSIS — D631 Anemia in chronic kidney disease: Secondary | ICD-10-CM | POA: Diagnosis not present

## 2022-09-13 DIAGNOSIS — N186 End stage renal disease: Secondary | ICD-10-CM | POA: Diagnosis not present

## 2022-09-13 NOTE — Progress Notes (Signed)
UPDATE 09/13/2022: CSW spoke to Joy with [facility] who requested pts D/C summary from weekend D/C. CSW printed and faxed to HD clinic as requested.

## 2022-09-13 NOTE — Telephone Encounter (Signed)
Pt's wife Darl Pikes called and said that she is currently the full time care taker for both pt and her mother and she would like to know if Dr. Caryl Never could write her note to take her out of work for a week, starting today. Call back number: [phone]. She states pt has recently been to the hospital twice, the most recent time for Covid.

## 2022-09-13 NOTE — Telephone Encounter (Signed)
I spoke with the patient's wife and she reported that Mychart will be fine to receive letter whenever this is completed.

## 2022-09-13 NOTE — Transitions of Care (Post Inpatient/ED Visit) (Signed)
09/13/2022  Name: Anthony Bullock MRN: [id number] DOB: July 24, 1960  Today's TOC FU Call Status: Today's TOC FU Call Status:: Successful TOC FU Call Completed TOC FU Call Complete Date: 09/13/22 Patient's Name and Date of Birth confirmed.  Transition Care Management Follow-up Telephone Call Date of Discharge: 09/09/22 [facility] Type of Discharge: Inpatient Admission Primary Inpatient Discharge Diagnosis:: "hypoxia" How have you been since you were released from the hospital?: Better (Spoke with both wife & pt. Pt states 'still weak but doing pretty good." He just left HD tx. Denies any SOB-occasional prod cough-taking abxs. Appetite good. LBM yesterday. CBGs running in the 200s due to steroids.) Any questions or concerns?: Yes Patient Questions/Concerns:: Wife wanting to know if pt had both COVID and PNA diagnosis in the hospital. States she looked in [facility] to find the info but was unable to do so. Patient Questions/Concerns Addressed: Other: (Reviewed info with wife/pt that per MD notes pt had COVID and PNA-being treated with abxs. She voiced understanding. No other needs or concerns.)  Items Reviewed: Did you receive and understand the discharge instructions provided?: Yes Medications obtained,verified, and reconciled?: Yes (Medications Reviewed) Any new allergies since your discharge?: No Dietary orders reviewed?: Yes Type of Diet Ordered:: low salt/heart healthy/carb modified/renal Do you have support at home?: Yes People in Home: spouse Name of Support/Comfort Primary Source: Darl Pikes  Medications Reviewed Today: Medications Reviewed Today     Reviewed by Charlyn Minerva, RN (Registered Nurse) on 09/13/22 at 1032  Med List Status: <None>   Medication Order Taking? Sig Documenting Provider Last Dose Status Informant  acetaminophen (TYLENOL) 325 MG tablet [id number] Yes Take 1-2 tablets (325-650 mg total) by mouth every 4 (four) hours as needed  for mild pain. Jacquelynn Cree, PA-C Taking Active Self  amiodarone (PACERONE) 200 MG tablet [id number] Yes Take 1 tablet (200 mg total) by mouth daily for 10 days. Vassie Loll, MD Taking Active   apixaban (ELIQUIS) 5 MG TABS tablet [id number] Yes Take 1 tablet (5 mg total) by mouth 2 (two) times daily. Rodolph Bong, MD Taking Active Self  ascorbic acid (VITAMIN C) 1000 MG tablet [id number] Yes Take 1 tablet (1,000 mg total) by mouth daily. Vassie Loll, MD Taking Active   atorvastatin (LIPITOR) 80 MG tablet [id number] Yes TAKE 1 TABLET BY MOUTH AT BEDTIME Burchette, Elberta Fortis, MD Taking Active Self  azelastine (ASTELIN) 0.1 % nasal spray [id number] Yes Place 2 sprays into both nostrils 3 (three) times daily as needed for rhinitis. Use in each nostril as directed Kristian Covey, MD Taking Active Self  benzonatate (TESSALON PERLES) 100 MG capsule [id number] Yes Take 1 capsule (100 mg total) by mouth 3 (three) times daily as needed for cough. Kristian Covey, MD Taking Active Self  carvedilol (COREG) 12.5 MG tablet [id number] Yes Take 1 tablet (12.5 mg total) by mouth 2 (two) times daily. Rodolph Bong, MD Taking Active Self  cefdinir (OMNICEF) 300 MG capsule [id number] Yes Take 1 capsule (300 mg total) by mouth 2 (two) times daily for 4 days. Vassie Loll, MD Taking Active   cinacalcet [facility]) 30 MG tablet [id number] Yes Take 1 tablet (30 mg total) by mouth Every Tuesday,Thursday,and Saturday with dialysis. Jacquelynn Cree, PA-C Taking Active Self  Continuous Glucose Receiver (FREESTYLE LIBRE 3 READER) [place] [id number] Yes Use to check blood glucose TID Kristian Covey, MD Taking Active Self  Continuous Glucose Sensor (FREESTYLE LIBRE 14 DAY SENSOR) [place] [id number] Yes USE AS  DIRECTED EVERY 14 DAYS Burchette, Elberta Fortis, MD Taking Active Self  Continuous Glucose Sensor (FREESTYLE LIBRE 3 SENSOR) [place] [id number] Yes Place 1 sensor on the skin every 14 days. Use to check glucose continuously  Kristian Covey, MD Taking Active Self  erythromycin ophthalmic ointment [id number] Yes Place 1 Application into the right eye at bedtime. [provider] Taking Active Self  fenofibrate 160 MG tablet [id number] Yes TAKE 1 TABLET BY MOUTH ONCE DAILY Burchette, Elberta Fortis, MD Taking Active Self  fexofenadine (ALLEGRA) 180 MG tablet [id number] Yes Take 180 mg by mouth daily as needed for allergies. [provider] Taking Active Self  gabapentin (NEURONTIN) 300 MG capsule [id number] Yes TAKE 2 CAPSULES BY MOUTH TWICE DAILY .  Patient taking differently: Take 600 mg by mouth 2 (two) times daily.   Kristian Covey, MD Taking Active Self  glucose blood test strip [id number] Yes Check 1 time daily. E11.9 One Touch Ultra Blue Test Strips Kristian Covey, MD Taking Active Self  HUMALOG KWIKPEN 200 UNIT/ML KwikPen [id number] Yes Inject 2-28 Units into the skin See admin instructions. INJECT A MAXIMUM OF 28 UNITS SUBCUTANEOUSLY TWICE DAILY WITH LUNCH AND SUPPER PER SLIDING SCALE. APPOINTMENT REQUIRED FOR FUTURE REFILLS Vassie Loll, MD Taking Active   Insulin Pen Needle (BD PEN NEEDLE NANO U/F) 32G X 4 MM MISC [id number] Yes USE 1 PEN NEEDLE SUBCUTANEOUSLY WITH INSULIN 4 TIMES DAILY Burchette, Elberta Fortis, MD Taking Active Self  lanthanum (FOSRENOL) 500 MG chewable tablet [id number] Yes Chew 500-1,000 mg by mouth See admin instructions. Take 1000 mg with meals three time a day and 500 mg with snacks [provider] Taking Active Self  LANTUS SOLOSTAR 100 UNIT/ML Solostar Pen [id number] Yes INJECT 12 UNITS SUBCUTANEOUSLY AT BEDTIME  Patient taking differently: Inject 14 Units into the skin at bedtime.   Kristian Covey, MD Taking Active Self  midodrine (PROAMATINE) 10 MG tablet [id number] Yes Take 10 mg by mouth 3 (three) times a week. [provider] Taking Active Self           Med Note Andrey Campanile, MACI D   Tue Sep 06, 2022 11:12 AM) Ulanda Edison and Saturday   multivitamin  (RENA-VIT) TABS tablet [id number] Yes Take 1 tablet by mouth once daily Burchette, Elberta Fortis, MD Taking Active Self  nortriptyline (PAMELOR) 25 MG capsule [id number] Yes Take 1 capsule (25 mg total) by mouth at bedtime. Sater, Pearletha Furl, MD Taking Active Self  Olopatadine HCl 0.2 % SOLN [id number] Yes Place 1 drop into both eyes daily as needed (for allergies).  [provider] Taking Active Self  pantoprazole (PROTONIX) 40 MG tablet [id number] Yes Take 1 tablet (40 mg total) by mouth daily. Vassie Loll, MD Taking Active   predniSONE (DELTASONE) 20 MG tablet [id number] Yes Take 3 tablets by mouth x 1 day; then 2 tablets by mouth x 2 days; then 1 tablet by mouth x 3 days; then half tablet by mouth x 3 days and stop prednisone. Vassie Loll, MD Taking Active   tobramycin (TOBREX) 0.3 % ophthalmic solution [id number] Yes Place 1 drop into the right eye every 6 (six) hours. [provider] Taking Active Self  VENTOLIN HFA 108 (90 Base) MCG/ACT inhaler [id number] Yes Inhale 1-2 puffs into the lungs every 6 (six) hours as needed for wheezing or shortness of breath. Kristian Covey, MD Taking Active Self  zinc sulfate 220 (50 Zn) MG capsule [id number] Yes Take 1 capsule (220 mg total) by mouth daily. Gwenlyn Perking,  Mikle Bosworth, MD Taking Active   Med List Note Carver Fila 07/09/15 1108): Pt has dialysis on Tuesdays, Thursdays, saturdays            Home Care and Equipment/Supplies: Were Home Health Services Ordered?: NA Any new equipment or medical supplies ordered?: NA  Functional Questionnaire: Do you need assistance with bathing/showering or dressing?: No Do you need assistance with meal preparation?: No Do you need assistance with eating?: No Do you have difficulty maintaining continence: No Do you need assistance with getting out of bed/getting out of a chair/moving?: No Do you have difficulty managing or taking your medications?: No  Follow up appointments reviewed: PCP  Follow-up appointment confirmed?: Yes Date of PCP follow-up appointment?: 09/16/22 Follow-up Provider: Dr. Caryl Never Specialist [facility] Follow-up appointment confirmed?: NA Do you need transportation to your follow-up appointment?: No (wife confirms she is able to get pt to appts) Do you understand care options if your condition(s) worsen?: Yes-patient verbalized understanding  SDOH Interventions Today    Flowsheet Row Most Recent Value  SDOH Interventions   Food Insecurity Interventions Intervention Not Indicated  Transportation Interventions Intervention Not Indicated      TOC Interventions Today    Flowsheet Row Most Recent Value  TOC Interventions   TOC Interventions Discussed/Reviewed TOC Interventions Discussed, S/S of infection      Interventions Today    Flowsheet Row Most Recent Value  Chronic Disease   Chronic disease during today's visit Diabetes, Chronic Kidney Disease/End Stage Renal Disease (ESRD)  General Interventions   General Interventions Discussed/Reviewed General Interventions Discussed, Durable Medical Equipment (DME), Vaccines  Vaccines Pneumonia, COVID-19  Doctor Visits Discussed/Reviewed Doctor Visits Discussed, PCP, Specialist  Durable Medical Equipment (DME) Glucomoter  [pt advised to alert MD of elevated cbgs due to steroid usage]  PCP/Specialist Visits Compliance with follow-up visit  Education Interventions   Education Provided Provided Education  Provided Verbal Education On Nutrition, Blood Sugar Monitoring, Medication, When to see the doctor, Other  [resp sx mgmt]  Nutrition Interventions   Nutrition Discussed/Reviewed Nutrition Discussed, Adding fruits and vegetables, Increasing proteins, Decreasing fats, Fluid intake, Decreasing salt, Decreasing sugar intake       Antionette Fairy, RN,BSN,CCM [facility] Care Management Care Management Community Coordinator Direct Phone: [phone] Toll Free: [phone] Fax:  [phone]

## 2022-09-15 DIAGNOSIS — N186 End stage renal disease: Secondary | ICD-10-CM | POA: Diagnosis not present

## 2022-09-15 DIAGNOSIS — D509 Iron deficiency anemia, unspecified: Secondary | ICD-10-CM | POA: Diagnosis not present

## 2022-09-15 DIAGNOSIS — Z992 Dependence on renal dialysis: Secondary | ICD-10-CM | POA: Diagnosis not present

## 2022-09-15 DIAGNOSIS — N2581 Secondary hyperparathyroidism of renal origin: Secondary | ICD-10-CM | POA: Diagnosis not present

## 2022-09-15 DIAGNOSIS — D631 Anemia in chronic kidney disease: Secondary | ICD-10-CM | POA: Diagnosis not present

## 2022-09-16 ENCOUNTER — Other Ambulatory Visit: Payer: Self-pay | Admitting: Family Medicine

## 2022-09-16 ENCOUNTER — Ambulatory Visit: Payer: Medicare Other | Admitting: Family Medicine

## 2022-09-16 ENCOUNTER — Encounter: Payer: Self-pay | Admitting: Family Medicine

## 2022-09-16 VITALS — BP 106/60 | HR 69 | Temp 97.8°F | Ht 71.0 in | Wt 212.3 lb

## 2022-09-16 DIAGNOSIS — J9601 Acute respiratory failure with hypoxia: Secondary | ICD-10-CM

## 2022-09-16 DIAGNOSIS — I5022 Chronic systolic (congestive) heart failure: Secondary | ICD-10-CM

## 2022-09-16 DIAGNOSIS — E1169 Type 2 diabetes mellitus with other specified complication: Secondary | ICD-10-CM | POA: Diagnosis not present

## 2022-09-16 DIAGNOSIS — N186 End stage renal disease: Secondary | ICD-10-CM | POA: Diagnosis not present

## 2022-09-16 DIAGNOSIS — I480 Paroxysmal atrial fibrillation: Secondary | ICD-10-CM

## 2022-09-16 DIAGNOSIS — Z992 Dependence on renal dialysis: Secondary | ICD-10-CM

## 2022-09-16 DIAGNOSIS — Z794 Long term (current) use of insulin: Secondary | ICD-10-CM | POA: Diagnosis not present

## 2022-09-16 DIAGNOSIS — E785 Hyperlipidemia, unspecified: Secondary | ICD-10-CM | POA: Diagnosis not present

## 2022-09-16 NOTE — Progress Notes (Signed)
Established Patient Office Visit  Subjective   Patient ID: Anthony Bullock, male    DOB: October 04, 1960  Age: 62 y.o. MRN: [id number]  No chief complaint on file.   HPI   Anthony Bullock is here again today for hospital follow-up.  Had just been seen couple weeks ago for hospital follow-up related to volume overload.  Refer to that dictation from 09-02-2022.  Seem to be improved at that point but then developed some cough and increasing shortness of breath and was readmitted on the 27th through the 30th.  On admission he denied any fever, chills, chest pain, headache.  He did have some nonproductive coughing.  COVID screen came back positive.  In the ED was hypoxic with oxygen saturation at room air 89%.  White count 14,000.  Electrolytes stable.  Chest x-ray showed some chronic interstitial markings but no clear infiltrate.  He was covered with broad-spectrum antibiotics and also started on remdesivir, steroids, bronchodilators, and supportive care.  Eventually weaned off oxygen after some initial use of BiPAP.  Was treated for possible community-acquired pneumonia with ceftriaxone and Zithromax.  He has end-stage renal disease on dialysis and is getting his regular dialysis follow-up.  He and his wife have made some positive dietary changes since last admission.  They have been very strict with watching sodium intake and trying to eat less processed food.  He denies any cough at this time.  No dyspnea.  History of chronic diastolic and systolic heart failure with ejection fraction 30 to 35% on recent echo.  Was recently diagnosed with atrial fibrillation and now takes Eliquis and remains on beta-blocker.  Regarding diabetes he has had expected elevated blood sugars with recent steroids.  These have come down significantly since discharge.  In fact, he had glucose this morning of 69.  He is using Lantus along with sliding scale.  His last A1c was 8.7%.  Will plan to follow-up in a couple months  Past Medical  History:  Diagnosis Date   AICD (automatic cardioverter/defibrillator) present    [employer]   Allergic rhinitis    Anemia    Arthritis    Chronic systolic heart failure (HCC)    a. ECHO (12/2012) EF 25-30%, HK entireanteroseptal myocardium //  b.  EF 25%, diffuse HK, grade 1 diastolic dysfunction, MAC, mild LAE, normal RVSF, trivial pericardial effusion   COPD (chronic obstructive pulmonary disease) (HCC)    Diabetes mellitus type II    Diabetic nephropathy (HCC)    Diabetic neuropathy (HCC)    ESRD on hemodialysis (HCC)    started HD June 2017, goes to [facility] HD unit, Dr Fausto Skillern   History of cardiac catheterization    a.Myoview 1/15:  There is significant left ventricular dysfunction. There may be slight scar at the apex. There is no significant ischemia. LV Ejection Fraction: 27%  //  b. RHC/LHC (1/15) with mean RA 6, PA 47/22 mean 33, mean PCWP 20, PVR 2.5 WU, CI 2.5; 80% dLAD stenosis, 70% diffuse large D.     History of kidney stones    Hyperlipidemia    Hypertension    Kidney stones    NICM (nonischemic cardiomyopathy) (HCC)    Primarily nonischemic.  Echo (12/14) with EF 25-30%.  Echo (3/15) with EF 25%, mild to moderately dilated LV, normal RV size and systolic function.     Osteomyelitis (HCC)    left fifth ray   Pneumonia    Urethral stricture    Wears glasses  Past Surgical History:  Procedure Laterality Date   ABDOMINAL AORTOGRAM W/LOWER EXTREMITY N/A 03/30/2016   Procedure: Abdominal Aortogram w/Lower Extremity;  Surgeon: Chuck Hint, MD;  Location: [facility] INVASIVE CV LAB;  Service: Cardiovascular;  Laterality: N/A;   AMPUTATION Right 04/26/2016   Procedure: Right Below Knee Amputation;  Surgeon: Nadara Mustard, MD;  Location: [facility] OR;  Service: Orthopedics;  Laterality: Right;   AMPUTATION Left 08/21/2019   Procedure: LEFT FOOT 5TH RAY AMPUTATION;  Surgeon: Nadara Mustard, MD;  Location: [facility] OR;  Service: Orthopedics;  Laterality: Left;   AMPUTATION  Left 11/13/2019   Procedure: LEFT BELOW KNEE AMPUTATION;  Surgeon: Nadara Mustard, MD;  Location: [facility] OR;  Service: Orthopedics;  Laterality: Left;   AV FISTULA PLACEMENT Right 09/08/2015   Procedure: INSERTION OF 4-62mm x 45cm  ARTERIOVENOUS (AV) GORE-TEX GRAFT RIGHT UPPER  ARM;  Surgeon: Chuck Hint, MD;  Location: [facility] OR;  Service: Vascular;  Laterality: Right;   AV FISTULA PLACEMENT Left 01/14/2016   Procedure: CREATION OF LEFT UPPER ARM ARTERIOVENOUS FISTULA;  Surgeon: Chuck Hint, MD;  Location: [facility] OR;  Service: Vascular;  Laterality: Left;   BASCILIC VEIN TRANSPOSITION Right 08/22/2014   Procedure: RIGHT UPPER ARM BASCILIC VEIN TRANSPOSITION;  Surgeon: Chuck Hint, MD;  Location: [facility] OR;  Service: Vascular;  Laterality: Right;   BELOW KNEE LEG AMPUTATION Right 04/26/2016   CARDIAC CATHETERIZATION     CARDIAC DEFIBRILLATOR PLACEMENT  06/27/2013   Sub Q       BY DR Graciela Husbands   CATARACT EXTRACTION W/PHACO Right 08/06/2018   Procedure: CATARACT EXTRACTION PHACO AND INTRAOCULAR LENS PLACEMENT (IOC);  Surgeon: Fabio Pierce, MD;  Location: [facility] ORS;  Service: Ophthalmology;  Laterality: Right;  CDE: 4.06   CATARACT EXTRACTION W/PHACO Left 08/20/2018   Procedure: CATARACT EXTRACTION PHACO AND INTRAOCULAR LENS PLACEMENT (IOC);  Surgeon: Fabio Pierce, MD;  Location: [facility] ORS;  Service: Ophthalmology;  Laterality: Left;  CDE: 6.76   COLONOSCOPY WITH PROPOFOL N/A 07/22/2015   Procedure: COLONOSCOPY WITH PROPOFOL;  Surgeon: Sherrilyn Rist, MD;  Location: [facility] ENDOSCOPY;  Service: Gastroenterology;  Laterality: N/A;   FEMORAL-POPLITEAL BYPASS GRAFT Right 03/31/2016   Procedure: BYPASS GRAFT FEMORAL-POPLITEAL ARTERY USING RIGHT GREATER SAPHENOUS NONREVERSED VEIN;  Surgeon: Chuck Hint, MD;  Location: [facility] OR;  Service: Vascular;  Laterality: Right;   HERNIA REPAIR     I & D EXTREMITY Right 03/31/2016   Procedure: IRRIGATION AND DEBRIDEMENT FOOT;  Surgeon: Chuck Hint, MD;   Location: [facility] OR;  Service: Vascular;  Laterality: Right;   IMPLANTABLE CARDIOVERTER DEFIBRILLATOR IMPLANT N/A 06/27/2013   Procedure: SUB Q ICD;  Surgeon: Duke Salvia, MD;  Location: [facility] CATH LAB;  Service: Cardiovascular;  Laterality: N/A;   INTRAOPERATIVE ARTERIOGRAM Right 03/31/2016   Procedure: INTRA OPERATIVE ARTERIOGRAM;  Surgeon: Chuck Hint, MD;  Location: [facility] OR;  Service: Vascular;  Laterality: Right;   IR GENERIC HISTORICAL Right 11/30/2015   IR THROMBECTOMY AV FISTULA W/THROMBOLYSIS/PTA INC/SHUNT/IMG RIGHT 11/30/2015 Irish Lack, MD [facility]   IR GENERIC HISTORICAL  11/30/2015   IR US GUIDE VASC ACCESS RIGHT 11/30/2015 Irish Lack, MD [facility]   IR GENERIC HISTORICAL Right 12/15/2015   IR THROMBECTOMY AV FISTULA W/THROMBOLYSIS/PTA/STENT INC/SHUNT/IMG RT 12/15/2015 Oley Balm, MD [facility]   IR GENERIC HISTORICAL  12/15/2015   IR US GUIDE VASC ACCESS RIGHT 12/15/2015 Oley Balm, MD [facility]   IR GENERIC HISTORICAL  12/28/2015   IR FLUORO GUIDE CV LINE  RIGHT 12/28/2015 Jolaine Click, MD [facility]   IR GENERIC HISTORICAL  12/28/2015   IR US GUIDE VASC ACCESS RIGHT 12/28/2015 Jolaine Click, MD [facility]   LEFT A ND RIGHT HEART CATH  01/30/2013   DR Jones Broom   LEFT AND RIGHT HEART CATHETERIZATION WITH CORONARY ANGIOGRAM N/A 01/30/2013   Procedure: LEFT AND RIGHT HEART CATHETERIZATION WITH CORONARY ANGIOGRAM;  Surgeon: Dolores Patty, MD;  Location: [facility] CATH LAB;  Service: Cardiovascular;  Laterality: N/A;   PERIPHERAL VASCULAR CATHETERIZATION Right 01/26/2015   Procedure: A/V Fistulagram;  Surgeon: Chuck Hint, MD;  Location: [facility] INVASIVE CV LAB;  Service: Cardiovascular;  Laterality: Right;   reapea urethral surgery for recurrent obstruction  2011   TOTAL KNEE ARTHROPLASTY Right 2007   VEIN HARVEST Right 03/31/2016   Procedure: RIGHT GREATER SAPHENOUS VEIN HARVEST;  Surgeon: Chuck Hint, MD;  Location: [facility] OR;  Service:  Vascular;  Laterality: Right;    reports that he quit smoking about 12 years ago. His smoking use included cigarettes. He started smoking about 44 years ago. He has a 64 pack-year smoking history. He has never used smokeless tobacco. He reports that he does not drink alcohol and does not use drugs. family history includes Alcohol abuse in his father; Bladder Cancer in his mother; Diabetes in his maternal grandmother; Heart Problems in his maternal grandmother; Heart disease in his maternal grandfather; Melanoma in his father; Prostate cancer in his maternal grandfather; Stroke in his maternal grandmother. Allergies  Allergen Reactions   Epoetin Alfa Other (See Comments)    unknown   Ferumoxytol Other (See Comments)    unknown   Morphine Sulfate Rash and Other (See Comments)    Itches all over, red spots    Review of Systems  Constitutional:  Negative for chills, fever and malaise/fatigue.  Eyes:  Negative for blurred vision.  Respiratory:  Negative for cough, hemoptysis, sputum production, shortness of breath and wheezing.   Cardiovascular:  Negative for chest pain.  Gastrointestinal:  Negative for abdominal pain.  Neurological:  Negative for dizziness, weakness and headaches.      Objective:     BP 106/60 (BP Location: Left Arm, Patient Position: Sitting, Cuff Size: Normal)   Pulse 69   Temp 97.8 F (36.6 C) (Oral)   Ht 5\' 11"  (1.803 m)   Wt 212 lb 4.8 oz (96.3 kg)   SpO2 98%   BMI 29.61 kg/m  BP Readings from Last 3 Encounters:  09/16/22 106/60  09/09/22 (!) 131/51  09/02/22 100/60   Wt Readings from Last 3 Encounters:  09/16/22 212 lb 4.8 oz (96.3 kg)  09/09/22 199 lb 4.7 oz (90.4 kg)  09/02/22 212 lb 9.6 oz (96.4 kg)      Physical Exam Vitals reviewed.  Constitutional:      General: He is not in acute distress.    Appearance: He is well-developed. He is not ill-appearing.  Eyes:     Pupils: Pupils are equal, round, and reactive to light.  Neck:     Thyroid:  No thyromegaly.  Cardiovascular:     Rate and Rhythm: Normal rate and regular rhythm.  Pulmonary:     Effort: Pulmonary effort is normal. No respiratory distress.     Breath sounds: Normal breath sounds. No wheezing or rales.  Musculoskeletal:     Cervical back: Neck supple.  Neurological:     Mental Status: He is alert and oriented to person, place, and time.      No results  found for any visits on 09/16/22.  Last CBC Lab Results  Component Value Date   WBC 11.8 (H) 09/08/2022   HGB 10.3 (L) 09/08/2022   HCT 32.2 (L) 09/08/2022   MCV 93.9 09/08/2022   MCH 30.0 09/08/2022   RDW 16.8 (H) 09/08/2022   PLT 222 09/08/2022   Last metabolic panel Lab Results  Component Value Date   GLUCOSE 426 (H) 09/08/2022   NA 132 (L) 09/08/2022   K 4.4 09/08/2022   CL 95 (L) 09/08/2022   CO2 23 09/08/2022   BUN 68 (H) 09/08/2022   CREATININE 9.49 (H) 09/08/2022   GFRNONAA 6 (L) 09/08/2022   CALCIUM 7.8 (L) 09/08/2022   PHOS 6.1 (H) 09/08/2022   PROT 6.4 (L) 09/07/2022   ALBUMIN 2.6 (L) 09/08/2022   BILITOT 0.7 09/07/2022   ALKPHOS 51 09/07/2022   AST 22 09/07/2022   ALT 20 09/07/2022   ANIONGAP 14 09/08/2022   Last lipids Lab Results  Component Value Date   CHOL 132 06/24/2022   HDL 38.50 (L) 06/24/2022   LDLCALC 57 06/24/2022   LDLDIRECT 134.0 04/25/2011   TRIG 181.0 (H) 06/24/2022   CHOLHDL 3 06/24/2022   Last hemoglobin A1c Lab Results  Component Value Date   HGBA1C 8.7 (A) 06/24/2022      The 10-year ASCVD risk score (Arnett DK, et al., 2019) is: 11.3%    Assessment & Plan:   #1 recent acute respiratory failure with hypoxia in the setting of acute COVID infection.  Patient treated as above with steroids, bronchodilators, remdesivir, ceftriaxone, Zithromax.  Did not sound like he had any clear infiltrates on chest x-ray.  Improved and back to baseline at this time.  -Strongly suggested flu vaccine but he and his wife wish to wait until October and get flu  vaccine and COVID-vaccine at the same time  #2 type 2 diabetes.  Last A1c 8.7%.  Blood sugars expectantly exacerbated by recent steroids.  Continue Lantus plus sliding scale insulin.  Recheck A1c at follow-up.  Home blood sugars are improving as he has gotten further away from the steroids  #3 end-stage renal disease on hemodialysis followed closely by nephrology  #4 hypertension stable on carvedilol  #5 atrial fibrillation treated with Eliquis and carvedilol.  #6 chronic combined systolic and diastolic heart failure.  Patient currently stable symptomatically.  Follow-up for any rapid weight gain, recurrent dyspnea, or other concerns  Evelena Peat, MD

## 2022-09-16 NOTE — Patient Instructions (Signed)
Remember to get flu vaccine this Fall in October.

## 2022-09-17 DIAGNOSIS — N2581 Secondary hyperparathyroidism of renal origin: Secondary | ICD-10-CM | POA: Diagnosis not present

## 2022-09-17 DIAGNOSIS — D631 Anemia in chronic kidney disease: Secondary | ICD-10-CM | POA: Diagnosis not present

## 2022-09-17 DIAGNOSIS — D509 Iron deficiency anemia, unspecified: Secondary | ICD-10-CM | POA: Diagnosis not present

## 2022-09-17 DIAGNOSIS — Z992 Dependence on renal dialysis: Secondary | ICD-10-CM | POA: Diagnosis not present

## 2022-09-17 DIAGNOSIS — N186 End stage renal disease: Secondary | ICD-10-CM | POA: Diagnosis not present

## 2022-09-20 DIAGNOSIS — D509 Iron deficiency anemia, unspecified: Secondary | ICD-10-CM | POA: Diagnosis not present

## 2022-09-20 DIAGNOSIS — Z992 Dependence on renal dialysis: Secondary | ICD-10-CM | POA: Diagnosis not present

## 2022-09-20 DIAGNOSIS — N186 End stage renal disease: Secondary | ICD-10-CM | POA: Diagnosis not present

## 2022-09-20 DIAGNOSIS — D631 Anemia in chronic kidney disease: Secondary | ICD-10-CM | POA: Diagnosis not present

## 2022-09-20 DIAGNOSIS — N2581 Secondary hyperparathyroidism of renal origin: Secondary | ICD-10-CM | POA: Diagnosis not present

## 2022-09-22 DIAGNOSIS — Z992 Dependence on renal dialysis: Secondary | ICD-10-CM | POA: Diagnosis not present

## 2022-09-22 DIAGNOSIS — N2581 Secondary hyperparathyroidism of renal origin: Secondary | ICD-10-CM | POA: Diagnosis not present

## 2022-09-22 DIAGNOSIS — N186 End stage renal disease: Secondary | ICD-10-CM | POA: Diagnosis not present

## 2022-09-22 DIAGNOSIS — D509 Iron deficiency anemia, unspecified: Secondary | ICD-10-CM | POA: Diagnosis not present

## 2022-09-22 DIAGNOSIS — D631 Anemia in chronic kidney disease: Secondary | ICD-10-CM | POA: Diagnosis not present

## 2022-09-24 ENCOUNTER — Observation Stay (HOSPITAL_COMMUNITY)
Admission: EM | Admit: 2022-09-24 | Discharge: 2022-09-24 | Disposition: A | Discharge status: Home / Self Care | Payer: Medicare Other | Attending: Emergency Medicine | Admitting: Emergency Medicine

## 2022-09-24 ENCOUNTER — Other Ambulatory Visit: Payer: Self-pay

## 2022-09-24 ENCOUNTER — Encounter (HOSPITAL_COMMUNITY): Payer: Self-pay | Admitting: Emergency Medicine

## 2022-09-24 ENCOUNTER — Emergency Department (HOSPITAL_COMMUNITY): Payer: Medicare Other

## 2022-09-24 DIAGNOSIS — E8779 Other fluid overload: Secondary | ICD-10-CM

## 2022-09-24 DIAGNOSIS — Z89512 Acquired absence of left leg below knee: Secondary | ICD-10-CM | POA: Insufficient documentation

## 2022-09-24 DIAGNOSIS — Z96651 Presence of right artificial knee joint: Secondary | ICD-10-CM | POA: Insufficient documentation

## 2022-09-24 DIAGNOSIS — E1122 Type 2 diabetes mellitus with diabetic chronic kidney disease: Secondary | ICD-10-CM | POA: Diagnosis not present

## 2022-09-24 DIAGNOSIS — E876 Hypokalemia: Secondary | ICD-10-CM | POA: Insufficient documentation

## 2022-09-24 DIAGNOSIS — R069 Unspecified abnormalities of breathing: Secondary | ICD-10-CM | POA: Diagnosis not present

## 2022-09-24 DIAGNOSIS — I5023 Acute on chronic systolic (congestive) heart failure: Secondary | ICD-10-CM | POA: Diagnosis present

## 2022-09-24 DIAGNOSIS — I499 Cardiac arrhythmia, unspecified: Secondary | ICD-10-CM | POA: Diagnosis not present

## 2022-09-24 DIAGNOSIS — I428 Other cardiomyopathies: Secondary | ICD-10-CM

## 2022-09-24 DIAGNOSIS — I5043 Acute on chronic combined systolic (congestive) and diastolic (congestive) heart failure: Secondary | ICD-10-CM | POA: Insufficient documentation

## 2022-09-24 DIAGNOSIS — N186 End stage renal disease: Secondary | ICD-10-CM | POA: Diagnosis not present

## 2022-09-24 DIAGNOSIS — Z95810 Presence of automatic (implantable) cardiac defibrillator: Secondary | ICD-10-CM | POA: Insufficient documentation

## 2022-09-24 DIAGNOSIS — Z79899 Other long term (current) drug therapy: Secondary | ICD-10-CM | POA: Insufficient documentation

## 2022-09-24 DIAGNOSIS — Z452 Encounter for adjustment and management of vascular access device: Secondary | ICD-10-CM | POA: Diagnosis not present

## 2022-09-24 DIAGNOSIS — I110 Hypertensive heart disease with heart failure: Secondary | ICD-10-CM | POA: Diagnosis not present

## 2022-09-24 DIAGNOSIS — Z7901 Long term (current) use of anticoagulants: Secondary | ICD-10-CM | POA: Insufficient documentation

## 2022-09-24 DIAGNOSIS — R0989 Other specified symptoms and signs involving the circulatory and respiratory systems: Secondary | ICD-10-CM | POA: Diagnosis not present

## 2022-09-24 DIAGNOSIS — I509 Heart failure, unspecified: Secondary | ICD-10-CM

## 2022-09-24 DIAGNOSIS — Z89422 Acquired absence of other left toe(s): Secondary | ICD-10-CM | POA: Insufficient documentation

## 2022-09-24 DIAGNOSIS — J9601 Acute respiratory failure with hypoxia: Principal | ICD-10-CM | POA: Diagnosis present

## 2022-09-24 DIAGNOSIS — E785 Hyperlipidemia, unspecified: Secondary | ICD-10-CM | POA: Diagnosis present

## 2022-09-24 DIAGNOSIS — I4891 Unspecified atrial fibrillation: Secondary | ICD-10-CM | POA: Diagnosis not present

## 2022-09-24 DIAGNOSIS — Z87891 Personal history of nicotine dependence: Secondary | ICD-10-CM | POA: Diagnosis not present

## 2022-09-24 DIAGNOSIS — I10 Essential (primary) hypertension: Secondary | ICD-10-CM | POA: Diagnosis present

## 2022-09-24 DIAGNOSIS — Z794 Long term (current) use of insulin: Secondary | ICD-10-CM | POA: Insufficient documentation

## 2022-09-24 DIAGNOSIS — E118 Type 2 diabetes mellitus with unspecified complications: Secondary | ICD-10-CM | POA: Diagnosis present

## 2022-09-24 DIAGNOSIS — Z992 Dependence on renal dialysis: Secondary | ICD-10-CM | POA: Insufficient documentation

## 2022-09-24 DIAGNOSIS — I2510 Atherosclerotic heart disease of native coronary artery without angina pectoris: Secondary | ICD-10-CM | POA: Diagnosis present

## 2022-09-24 DIAGNOSIS — Z89511 Acquired absence of right leg below knee: Secondary | ICD-10-CM | POA: Diagnosis not present

## 2022-09-24 DIAGNOSIS — E1140 Type 2 diabetes mellitus with diabetic neuropathy, unspecified: Secondary | ICD-10-CM | POA: Diagnosis not present

## 2022-09-24 DIAGNOSIS — I480 Paroxysmal atrial fibrillation: Secondary | ICD-10-CM | POA: Diagnosis not present

## 2022-09-24 DIAGNOSIS — E8770 Fluid overload, unspecified: Secondary | ICD-10-CM | POA: Diagnosis not present

## 2022-09-24 DIAGNOSIS — J9621 Acute and chronic respiratory failure with hypoxia: Principal | ICD-10-CM | POA: Insufficient documentation

## 2022-09-24 DIAGNOSIS — R0602 Shortness of breath: Secondary | ICD-10-CM | POA: Diagnosis present

## 2022-09-24 DIAGNOSIS — R000 Tachycardia, unspecified: Secondary | ICD-10-CM | POA: Diagnosis not present

## 2022-09-24 DIAGNOSIS — J449 Chronic obstructive pulmonary disease, unspecified: Secondary | ICD-10-CM | POA: Diagnosis present

## 2022-09-24 DIAGNOSIS — R918 Other nonspecific abnormal finding of lung field: Secondary | ICD-10-CM | POA: Diagnosis not present

## 2022-09-24 DIAGNOSIS — I132 Hypertensive heart and chronic kidney disease with heart failure and with stage 5 chronic kidney disease, or end stage renal disease: Secondary | ICD-10-CM | POA: Insufficient documentation

## 2022-09-24 DIAGNOSIS — R0902 Hypoxemia: Secondary | ICD-10-CM | POA: Diagnosis not present

## 2022-09-24 DIAGNOSIS — E1142 Type 2 diabetes mellitus with diabetic polyneuropathy: Secondary | ICD-10-CM | POA: Diagnosis present

## 2022-09-24 LAB — BLOOD GAS, VENOUS
Acid-Base Excess: 5 mmol/L — ABNORMAL HIGH (ref 0.0–2.0)
Bicarbonate: 31 mmol/L — ABNORMAL HIGH (ref 20.0–28.0)
O2 Saturation: 43.3 %
Patient temperature: 36.5
pCO2, Ven: 49 mmHg (ref 44–60)
pH, Ven: 7.41 (ref 7.25–7.43)
pO2, Ven: <31 mmHg — CL (ref 32–45)

## 2022-09-24 LAB — COMPREHENSIVE METABOLIC PANEL
ALT: 22 U/L (ref 0–44)
AST: 22 U/L (ref 15–41)
Albumin: 2.8 g/dL — ABNORMAL LOW (ref 3.5–5.0)
Alkaline Phosphatase: 73 U/L (ref 38–126)
Anion gap: 14 (ref 5–15)
BUN: 43 mg/dL — ABNORMAL HIGH (ref 8–23)
CO2: 24 mmol/L (ref 22–32)
Calcium: 7.7 mg/dL — ABNORMAL LOW (ref 8.9–10.3)
Chloride: 96 mmol/L — ABNORMAL LOW (ref 98–111)
Creatinine, Ser: 8.65 mg/dL — ABNORMAL HIGH (ref 0.61–1.24)
GFR, Estimated: 6 mL/min — ABNORMAL LOW (ref >60)
Glucose, Bld: 264 mg/dL — ABNORMAL HIGH (ref 70–99)
Potassium: 3.3 mmol/L — ABNORMAL LOW (ref 3.5–5.1)
Sodium: 134 mmol/L — ABNORMAL LOW (ref 135–145)
Total Bilirubin: 0.8 mg/dL (ref 0.3–1.2)
Total Protein: 6 g/dL — ABNORMAL LOW (ref 6.5–8.1)

## 2022-09-24 LAB — HEPATITIS B SURFACE ANTIGEN: Hepatitis B Surface Ag: NONREACTIVE

## 2022-09-24 LAB — CBC WITH DIFFERENTIAL/PLATELET
Abs Immature Granulocytes: 0.06 10*3/uL (ref 0.00–0.07)
Basophils Absolute: 0 10*3/uL (ref 0.0–0.1)
Basophils Relative: 0 %
Eosinophils Absolute: 0.2 10*3/uL (ref 0.0–0.5)
Eosinophils Relative: 2 %
HCT: 37.5 % — ABNORMAL LOW (ref 39.0–52.0)
Hemoglobin: 12 g/dL — ABNORMAL LOW (ref 13.0–17.0)
Immature Granulocytes: 1 %
Lymphocytes Relative: 8 %
Lymphs Abs: 1 10*3/uL (ref 0.7–4.0)
MCH: 29.9 pg (ref 26.0–34.0)
MCHC: 32 g/dL (ref 30.0–36.0)
MCV: 93.5 fL (ref 80.0–100.0)
Monocytes Absolute: 1 10*3/uL (ref 0.1–1.0)
Monocytes Relative: 9 %
Neutro Abs: 9.5 10*3/uL — ABNORMAL HIGH (ref 1.7–7.7)
Neutrophils Relative %: 80 %
Platelets: 149 10*3/uL — ABNORMAL LOW (ref 150–400)
RBC: 4.01 MIL/uL — ABNORMAL LOW (ref 4.22–5.81)
RDW: 17.7 % — ABNORMAL HIGH (ref 11.5–15.5)
WBC: 11.9 10*3/uL — ABNORMAL HIGH (ref 4.0–10.5)
nRBC: 0 % (ref 0.0–0.2)

## 2022-09-24 LAB — MAGNESIUM: Magnesium: 1.9 mg/dL (ref 1.7–2.4)

## 2022-09-24 LAB — PHOSPHORUS: Phosphorus: 4.8 mg/dL — ABNORMAL HIGH (ref 2.5–4.6)

## 2022-09-24 LAB — PROTIME-INR
INR: 1.3 — ABNORMAL HIGH (ref 0.8–1.2)
Prothrombin Time: 16.1 s — ABNORMAL HIGH (ref 11.4–15.2)

## 2022-09-24 LAB — BRAIN NATRIURETIC PEPTIDE: B Natriuretic Peptide: 970 pg/mL — ABNORMAL HIGH (ref 0.0–100.0)

## 2022-09-24 LAB — TROPONIN I (HIGH SENSITIVITY)
Troponin I (High Sensitivity): 43 ng/L — ABNORMAL HIGH (ref <18)
Troponin I (High Sensitivity): 49 ng/L — ABNORMAL HIGH (ref <18)

## 2022-09-24 MED ORDER — HEPARIN SODIUM (PORCINE) 1000 UNIT/ML DIALYSIS
1000.0000 [IU] | INTRAMUSCULAR | Status: DC | PRN
  Administered 2022-09-24: 3800 [IU]

## 2022-09-24 MED ORDER — SODIUM CHLORIDE 0.9% FLUSH: 3.0000 mL | Freq: Two times a day (BID) | INTRAVENOUS | Status: DC

## 2022-09-24 MED ORDER — ALBUTEROL SULFATE HFA 108 (90 BASE) MCG/ACT IN AERS: 1.0000 | INHALATION_SPRAY | Freq: Four times a day (QID) | RESPIRATORY_TRACT | Status: DC | PRN

## 2022-09-24 MED ORDER — ACETAMINOPHEN 650 MG RE SUPP: 650.0000 mg | Freq: Four times a day (QID) | RECTAL | Status: DC | PRN

## 2022-09-24 MED ORDER — ATORVASTATIN CALCIUM 40 MG PO TABS: 80.0000 mg | ORAL_TABLET | Freq: Every day | ORAL | Status: DC

## 2022-09-24 MED ORDER — FENOFIBRATE 54 MG PO TABS: 54.0000 mg | ORAL_TABLET | Freq: Every day | ORAL | Status: DC

## 2022-09-24 MED ORDER — LANTHANUM CARBONATE 500 MG PO CHEW
1000.0000 mg | CHEWABLE_TABLET | Freq: Three times a day (TID) | ORAL | Status: DC
  Filled 2022-09-24 (×7): qty 2

## 2022-09-24 MED ORDER — FLEET ENEMA RE ENEM: 1.0000 | ENEMA | Freq: Once | RECTAL | Status: DC | PRN

## 2022-09-24 MED ORDER — HEPARIN SODIUM (PORCINE) 1000 UNIT/ML IJ SOLN
INTRAMUSCULAR | Status: AC
  Filled 2022-09-24: qty 4

## 2022-09-24 MED ORDER — GABAPENTIN 300 MG PO CAPS: 600.0000 mg | ORAL_CAPSULE | Freq: Two times a day (BID) | ORAL | Status: DC

## 2022-09-24 MED ORDER — PANTOPRAZOLE SODIUM 40 MG PO TBEC: 40.0000 mg | DELAYED_RELEASE_TABLET | Freq: Every day | ORAL | Status: DC

## 2022-09-24 MED ORDER — ACETAMINOPHEN 325 MG PO TABS: 650.0000 mg | ORAL_TABLET | Freq: Four times a day (QID) | ORAL | Status: DC | PRN

## 2022-09-24 MED ORDER — BISACODYL 5 MG PO TBEC: 5.0000 mg | DELAYED_RELEASE_TABLET | Freq: Every day | ORAL | Status: DC | PRN

## 2022-09-24 MED ORDER — MIDODRINE HCL 5 MG PO TABS: 10.0000 mg | ORAL_TABLET | ORAL | Status: DC

## 2022-09-24 MED ORDER — IPRATROPIUM BROMIDE 0.02 % IN SOLN: 0.5000 mg | Freq: Four times a day (QID) | RESPIRATORY_TRACT | Status: DC | PRN

## 2022-09-24 MED ORDER — INSULIN GLARGINE-YFGN 100 UNIT/ML ~~LOC~~ SOLN
15.0000 [IU] | Freq: Two times a day (BID) | SUBCUTANEOUS | Status: DC
  Filled 2022-09-24 (×3): qty 0.15

## 2022-09-24 MED ORDER — LANTHANUM CARBONATE 500 MG PO CHEW
500.0000 mg | CHEWABLE_TABLET | ORAL | Status: DC
  Filled 2022-09-24 (×8): qty 1

## 2022-09-24 MED ORDER — APIXABAN 5 MG PO TABS: 5.0000 mg | ORAL_TABLET | Freq: Two times a day (BID) | ORAL | Status: DC

## 2022-09-24 MED ORDER — ALBUTEROL SULFATE (2.5 MG/3ML) 0.083% IN NEBU: 2.5000 mg | INHALATION_SOLUTION | Freq: Four times a day (QID) | RESPIRATORY_TRACT | Status: DC | PRN

## 2022-09-24 MED ORDER — HYDROMORPHONE HCL 1 MG/ML IJ SOLN: 0.5000 mg | INTRAMUSCULAR | Status: DC | PRN

## 2022-09-24 MED ORDER — SENNOSIDES-DOCUSATE SODIUM 8.6-50 MG PO TABS: 1.0000 | ORAL_TABLET | Freq: Every evening | ORAL | Status: DC | PRN

## 2022-09-24 MED ORDER — ZOLPIDEM TARTRATE 5 MG PO TABS: 5.0000 mg | ORAL_TABLET | Freq: Every evening | ORAL | Status: DC | PRN

## 2022-09-24 MED ORDER — CARVEDILOL 12.5 MG PO TABS: 12.5000 mg | ORAL_TABLET | Freq: Two times a day (BID) | ORAL | Status: DC

## 2022-09-24 MED ORDER — ONDANSETRON HCL 4 MG/2ML IJ SOLN: 4.0000 mg | Freq: Four times a day (QID) | INTRAMUSCULAR | Status: DC | PRN

## 2022-09-24 MED ORDER — ONDANSETRON HCL 4 MG PO TABS: 4.0000 mg | ORAL_TABLET | Freq: Four times a day (QID) | ORAL | Status: DC | PRN

## 2022-09-24 MED ORDER — HEPARIN SODIUM (PORCINE) 5000 UNIT/ML IJ SOLN: 5000.0000 [IU] | Freq: Three times a day (TID) | INTRAMUSCULAR | Status: DC

## 2022-09-24 MED ORDER — NORTRIPTYLINE HCL 25 MG PO CAPS
25.0000 mg | ORAL_CAPSULE | Freq: Every day | ORAL | Status: DC
  Filled 2022-09-24 (×2): qty 1

## 2022-09-24 MED ORDER — ERYTHROMYCIN 5 MG/GM OP OINT: 1.0000 | TOPICAL_OINTMENT | Freq: Every day | OPHTHALMIC | Status: DC

## 2022-09-24 MED ORDER — HYDRALAZINE HCL 20 MG/ML IJ SOLN: 10.0000 mg | INTRAMUSCULAR | Status: DC | PRN

## 2022-09-24 MED ORDER — LEVALBUTEROL HCL 0.63 MG/3ML IN NEBU: 0.6300 mg | INHALATION_SOLUTION | Freq: Four times a day (QID) | RESPIRATORY_TRACT | Status: DC | PRN

## 2022-09-24 MED ORDER — AMIODARONE HCL 200 MG PO TABS: 200.0000 mg | ORAL_TABLET | Freq: Every day | ORAL | Status: DC

## 2022-09-24 MED ORDER — ALTEPLASE 2 MG IJ SOLR: 2.0000 mg | Freq: Once | INTRAMUSCULAR | Status: DC | PRN

## 2022-09-24 MED ORDER — RENA-VITE PO TABS: 1.0000 | ORAL_TABLET | Freq: Every day | ORAL | Status: DC

## 2022-09-24 MED ORDER — OXYCODONE HCL 5 MG PO TABS: 5.0000 mg | ORAL_TABLET | ORAL | Status: DC | PRN

## 2022-09-24 MED ORDER — MIDODRINE HCL 5 MG PO TABS
10.0000 mg | ORAL_TABLET | ORAL | Status: DC | PRN
  Administered 2022-09-24: 10 mg via ORAL

## 2022-09-24 MED ORDER — CINACALCET HCL 30 MG PO TABS
30.0000 mg | ORAL_TABLET | ORAL | Status: DC
  Filled 2022-09-24: qty 1

## 2022-09-24 NOTE — Care Management Obs Status (Signed)
MEDICARE OBSERVATION STATUS NOTIFICATION   Patient Details  Name: Anthony Bullock MRN: [id number] Date of Birth: [birth year]   Medicare Observation Status Notification Given:  Yes    Villa Herb, LCSWA 09/24/2022, 12:20 PM

## 2022-09-24 NOTE — TOC CM/SW Note (Signed)
Transition of Care [facility]) - Inpatient Brief Assessment   Patient Details  Name: Anthony Bullock MRN: [id number] Date of Birth: [birth year]  Transition of Care [facility]) CM/SW Contact:    Villa Herb, LCSWA Phone Number: 09/24/2022, 12:20 PM   Clinical Narrative: Transition of Care Department [facility]) has reviewed patient and no TOC needs have been identified at this time. We will continue to monitor patient advancement through interdisciplinary progression rounds. If new patient transition needs arise, please place a TOC consult.   TOC consulted for PCP needs, chart review completed and notes that pt has PCP at this time. HH/DME consult in as well. TOC to follow for needs during admission.   Transition of Care Asessment: Insurance and Status: Insurance coverage has been reviewed Patient has primary care physician: Yes Home environment has been reviewed: from home Prior level of function:: independent Prior/Current Home Services: No current home services Social Determinants of Health Reivew: SDOH reviewed no interventions necessary Readmission risk has been reviewed: Yes Transition of care needs: no transition of care needs at this time

## 2022-09-24 NOTE — ED Notes (Signed)
Patient reports he is left arm restricted.

## 2022-09-24 NOTE — Consult Note (Signed)
Brief Nephrology Note  63M ESRD [facility]S [facility] presented to the ED via EMS overnight with acute onset dyspnea with recent COVID infection and was in atrial fibrillation with RVR.  He was placed on BiPAP upon arrival but in the ED has weaned to nasal cannula.  Case discussed with EDP, Dr. Oletta Cohn.  In ED K3.3, BUN 43, HCO3 24, hemoglobin 12.0. Portable chest x-ray with vascular congestion and diffuse interstitial opacities suggesting edema.  Patient last in the hospital with a discharge last month.  Outpatient orders from that time are as follows:  Outpatient dialysis: outpatient HD orders: [facility], TTS.  EDW 91.5 kg.  TDC.  Flow rates: 400/500.  1K/2.5 calcium.  No heparin (only receives heparin locks).  Meds: Sensipar to 60 mg q. treatment, calcitriol 0.5 mcg q. treatment, Mircera 30 mcg every 4 weeks    Patient has AHRF, A-fib with RVR, and is due for routine dialysis today.  He is being admitted.  Vital signs are fairly stable.  Plan for dialysis today: TDC, 3.5h, 2-3L UF, 3K. No heparin.

## 2022-09-24 NOTE — ED Notes (Signed)
Patient's wife at bedside.

## 2022-09-24 NOTE — Progress Notes (Signed)
  HEMODIALYSIS TREATMENT NOTE:  Pre-HD: A/O x4, breathing "ok right now", spO2 99% on 4L O2 / [place].  Does not require O2 at baseline.  117/59, HR Afib 105-115.  No pacemaker, mentions he has ICD but states "They say I don't need it so it's turned off.  It needs a battery anyway."  Midodrine 10mg  given, as per his outpatient routine.    3.5 hour heparin-free treatment completed.  Goal met: 3 liters removed without interruption in UF.  All blood was returned.  Weaned to room air, saturating 91%.  Afib --> NSR 90s.  Feels "a whole lot better."  Post-tx:  09/24/22 1407  Vitals  Temp 97.9 F (36.6 C)  Temp Source Oral  BP 131/61  MAP (mmHg) 82  BP Location Right Arm  BP Method Automatic  Patient Position (if appropriate) Sitting  Pulse Rate 95  Pulse Rate Source Monitor  ECG Heart Rate 96  Resp 20  Oxygen Therapy  SpO2 93 %  O2 Device Room Air  During Treatment Monitoring  Blood Flow Rate (mL/min) 200 mL/min  Ultrafiltration Rate (mL/min) 0 mL/min  Cumulative Fluid Removed (mL) per Treatment  3000  HD Safety Checks Performed Yes  Intra-Hemodialysis Comments Tx completed  Dialysis Fluid Bolus Normal Saline  Post Treatment  Dialyzer Clearance Lightly streaked  Hemodialysis Intake (mL) 0 mL  Liters Processed 84  Fluid Removed (mL) 3000 mL  Tolerated HD Treatment Yes  Post-Hemodialysis Comments Goal met  Hemodialysis Catheter Right Internal jugular Double-lumen;Permanent  Placement Date/Time: 12/28/15 1732   Time Out: Correct patient;Correct site;Correct procedure  Maximum sterile barrier precautions: Hand hygiene;Sterile gown;Sterile gloves;Large sterile sheet;Mask;Cap  Site Prep: Chlorhexidine  Local Anesthetic: Inje...  Site Condition No complications  Blue Lumen Status Flushed;Heparin locked;Dead end cap in place  Red Lumen Status Flushed;Heparin locked;Dead end cap in place  Purple Lumen Status N/A  Catheter fill solution Heparin 1000 units/ml  Catheter fill volume (Arterial)  1.9 cc  Catheter fill volume (Venous) 1.9  Dressing Type Transparent;Tube stabilization device  Dressing Status Antimicrobial disc in place;Clean, Dry, Intact  Interventions New dressing  Drainage Description None  Dressing Change Due 10/01/22  Post treatment catheter status Capped and Clamped    Pt was assessed by Dr. Flossie Dibble post-dialysis and deemed stable for discharge home.     Arman Filter, RN [facility]

## 2022-09-24 NOTE — ED Triage Notes (Signed)
Patient BIB [facility] c/o shob that started tonight.  Patient reports he is 2 weeks post covid.  EMS reports patient initially in a fib RVR at 120-150 which went down to a fib 110-120 after placing patient on cpap. Patient denies missing dialysis.

## 2022-09-24 NOTE — ED Notes (Signed)
Pt requesting to take BI-pap mask off. Pt placed on 6L [place] at this time.

## 2022-09-24 NOTE — H&P (Signed)
History and Physical   Patient: Anthony Bullock                            PCP: Kristian Covey, MD                    DOB: 12-04-1960            DOA: 09/24/2022 JYN:[id number]             DOS: 09/24/2022, 10:28 AM  Kristian Covey, MD  Patient coming from:   HOME  I have personally reviewed patient's medical records, in electronic medical records, including:  [facility], and care everywhere.    Chief Complaint:   Chief Complaint  Patient presents with   Shortness of Breath    History of present illness:    Anthony Bullock is a 62 year old male with a history of ESRD on HD TTS, bilateral BKA, chronic S CHF, HFrEF  25-40%, COPD, DM II, neuropathy, HLD, HTN, nonischemic cardiomyopathy, OA, A-fib... Presented today with worsening shortness of breath, and swelling, and palpitation. Recent hospitalization and treatment for COVID infection status posttreatment.  ED evaluation/course:  Blood pressure 127/71, pulse (!) 102, temperature 97.7 F (36.5 C), RR 17, weight 97 kg, SpO2 98%. Patient was due for dialysis today presented to ED in acute respiratory failure was placed on BiPAP subsequently weaned down to O2 by nasal cannula, was found in A-fib with RVR heart rate around 150- 110 Labs: WBC 11.9, hemoglobin 12.0, platelets 149, sodium 143, potassium 3.3, BUN 43, creatinine 8.65, GFR 6, BNP 970  EDP has discussed the case with nephrologist Dr. Verna Czech who proceed with hemodialysis today -Patient's home medication will be restarted, heart rate is improving including his home dose of beta-blockers  Patient will be admitted for hemodialysis, close monitoring Of symptoms improve anticipating discharge later today.    Assessment / Plan:   Principal Problem:   Volume overload Active Problems:   Atrial fibrillation (HCC)   Acute on chronic HFrEF (heart failure with reduced ejection fraction) (HCC)   ESRD on dialysis [facility])   Essential hypertension   Hyperlipidemia   COPD  (chronic obstructive pulmonary disease) (HCC)   CAD (coronary artery disease)   S/P bilateral below knee amputation (HCC)   Diabetic peripheral neuropathy (HCC)   NICM (nonischemic cardiomyopathy) (HCC)   Acute respiratory failure with hypoxia (HCC)   Diabetes mellitus with complication (HCC)   Acute on chronic respiratory failure -due to volume overload -Was initially placed on BiPAP, currently weaned down to O2 by nasal cannula 98% -Initiating hemodialysis per nephrology this a.m. -DuoNeb bronchodilators  ESRD -Scheduled hemodialysis TTS, -Patient is volume overload, was due for hemodialysis today -Nephrologist Dr. Verna Czech has been notified by EDP, initiating hemodialysis this morning   Acute on chronic combined systolic and diastolic heart failure HFrEF -Most recent ejection fraction 30 to 35% on August 22, 2022 -Continue daily weights, strict I's and O's and low sodium diet -Volume management with hemodialysis -Patient GDMT is limited by low blood pressure -Continue beta-blocker. -BNP 970   A-fib with RVR  W h/o paroxysmal atrial fibrillation -Continue apixaban and continue beta-blocker -Exacerbated by respiratory distress, due to volume overload -Will utilize as needed IV metoprolol, amiodarone   Hypokalemia  -Monitoring repleted with p.o. potassium -Current serum potassium 3.3,, magnesium 1.9  history of coronary artery disease -Patient reports no chest pain -Continue beta-blocker and apixaban.   History of  COPD -She was placed on BiPAP, currently weaned off to O2 via nasal cannula, satting 98% -Symptoms more likely related to volume overload than COPD exacerbation -Continue DuoNeb bronchodilator -Withholding IV steroids use   Peripheral arterial disease -Status post bilateral BKA -Continue Eliquis -Continue Statins and rifaximin medication.   Hypertension -Continue carvedilol -Soft blood pressure currently appreciated    Uncontrolled type II  diabetes -A1c 8.7 -SSI and Lantus          Patient Denies having: Fever, Chills, Cough,  Chest Pain, Abd pain, N/V/D, headache, dizziness, lightheadedness,  Dysuria, Joint pain, rash, open wounds     Review of Systems: As per HPI, otherwise 10 point review of systems were negative.   ----------------------------------------------------------------------------------------------------------------------  Allergies  Allergen Reactions   Epoetin Alfa Other (See Comments)    unknown   Ferumoxytol Other (See Comments)    unknown   Morphine Sulfate Rash and Other (See Comments)    Itches all over, red spots    Home MEDs:  Prior to Admission medications   Medication Sig Start Date End Date Taking? Authorizing Provider  acetaminophen (TYLENOL) 325 MG tablet Take 1-2 tablets (325-650 mg total) by mouth every 4 (four) hours as needed for mild pain. 11/29/19   Love, Evlyn Kanner, PA-C  amiodarone (PACERONE) 200 MG tablet Take 1 tablet (200 mg total) by mouth daily for 10 days. 09/09/22 09/19/22  Vassie Loll, MD  apixaban (ELIQUIS) 5 MG TABS tablet Take 1 tablet (5 mg total) by mouth 2 (two) times daily. 08/26/22   Rodolph Bong, MD  ascorbic acid (VITAMIN C) 1000 MG tablet Take 1 tablet (1,000 mg total) by mouth daily. 09/10/22   Vassie Loll, MD  atorvastatin (LIPITOR) 80 MG tablet TAKE 1 TABLET BY MOUTH AT BEDTIME 06/24/22   Burchette, Elberta Fortis, MD  azelastine (ASTELIN) 0.1 % nasal spray Place 2 sprays into both nostrils 3 (three) times daily as needed for rhinitis. Use in each nostril as directed 03/10/14   Burchette, Elberta Fortis, MD  benzonatate (TESSALON PERLES) 100 MG capsule Take 1 capsule (100 mg total) by mouth 3 (three) times daily as needed for cough. 09/02/22   Burchette, Elberta Fortis, MD  carvedilol (COREG) 12.5 MG tablet Take 1 tablet (12.5 mg total) by mouth 2 (two) times daily. 08/26/22   Rodolph Bong, MD  cinacalcet (SENSIPAR) 30 MG tablet Take 1 tablet (30 mg total) by mouth  Every Tuesday,Thursday,and Saturday with dialysis. 11/30/19   Love, Evlyn Kanner, PA-C  Continuous Glucose Receiver (FREESTYLE LIBRE 3 READER) DEVI Use to check blood glucose TID 08/11/22   Burchette, Elberta Fortis, MD  Continuous Glucose Sensor (FREESTYLE LIBRE 14 DAY SENSOR) MISC USE AS DIRECTED EVERY 14 DAYS 05/11/22   Burchette, Elberta Fortis, MD  Continuous Glucose Sensor (FREESTYLE LIBRE 3 SENSOR) MISC Place 1 sensor on the skin every 14 days. Use to check glucose continuously 07/25/22   Burchette, Elberta Fortis, MD  erythromycin ophthalmic ointment Place 1 Application into the right eye at bedtime. 08/02/22   [provider]  fenofibrate 160 MG tablet TAKE 1 TABLET BY MOUTH ONCE DAILY 06/24/22   Burchette, Elberta Fortis, MD  fexofenadine (ALLEGRA) 180 MG tablet Take 180 mg by mouth daily as needed for allergies.    [provider]  gabapentin (NEURONTIN) 300 MG capsule TAKE 2 CAPSULES BY MOUTH TWICE DAILY . Patient taking differently: Take 600 mg by mouth 2 (two) times daily. 06/24/22   Burchette, Elberta Fortis, MD  glucose blood test strip  Check 1 time daily. E11.9 One Touch Ultra Blue Test Strips 03/10/14   Burchette, Elberta Fortis, MD  HUMALOG KWIKPEN 200 UNIT/ML KwikPen Inject 2-28 Units into the skin See admin instructions. INJECT A MAXIMUM OF 28 UNITS SUBCUTANEOUSLY TWICE DAILY WITH LUNCH AND SUPPER PER SLIDING SCALE. APPOINTMENT REQUIRED FOR FUTURE REFILLS 09/09/22   Vassie Loll, MD  Insulin Pen Needle (BD PEN NEEDLE NANO U/F) 32G X 4 MM MISC USE 1 PEN NEEDLE SUBCUTANEOUSLY WITH INSULIN 4 TIMES DAILY 12/04/19   Burchette, Elberta Fortis, MD  lanthanum (FOSRENOL) 500 MG chewable tablet Chew 500-1,000 mg by mouth See admin instructions. Take 1000 mg with meals three time a day and 500 mg with snacks    [provider]  LANTUS SOLOSTAR 100 UNIT/ML Solostar Pen INJECT 12 UNITS SUBCUTANEOUSLY AT BEDTIME Patient taking differently: Inject 14 Units into the skin at bedtime. 07/15/22   Burchette, Elberta Fortis, MD  midodrine  (PROAMATINE) 10 MG tablet Take 10 mg by mouth 3 (three) times a week. 08/04/20   [provider]  multivitamin (RENA-VIT) TABS tablet Take 1 tablet by mouth once daily 07/06/21   Burchette, Elberta Fortis, MD  nortriptyline (PAMELOR) 25 MG capsule Take 1 capsule (25 mg total) by mouth at bedtime. 08/09/22   Sater, Pearletha Furl, MD  Olopatadine HCl 0.2 % SOLN Place 1 drop into both eyes daily as needed (for allergies).     [provider]  pantoprazole (PROTONIX) 40 MG tablet Take 1 tablet (40 mg total) by mouth daily. 09/09/22 09/09/23  Vassie Loll, MD  predniSONE (DELTASONE) 20 MG tablet Take 3 tablets by mouth x 1 day; then 2 tablets by mouth x 2 days; then 1 tablet by mouth x 3 days; then half tablet by mouth x 3 days and stop prednisone. 09/09/22   Vassie Loll, MD  tobramycin (TOBREX) 0.3 % ophthalmic solution Place 1 drop into the right eye every 6 (six) hours. 08/02/22   [provider]  VENTOLIN HFA 108 (90 Base) MCG/ACT inhaler Inhale 1-2 puffs into the lungs every 6 (six) hours as needed for wheezing or shortness of breath. 07/25/22   Burchette, Elberta Fortis, MD  zinc sulfate 220 (50 Zn) MG capsule Take 1 capsule (220 mg total) by mouth daily. 09/10/22   Vassie Loll, MD    PRN MEDs: acetaminophen **OR** acetaminophen, albuterol, bisacodyl, hydrALAZINE, HYDROmorphone (DILAUDID) injection, ipratropium, levalbuterol, midodrine, ondansetron **OR** ondansetron (ZOFRAN) IV, oxyCODONE, senna-docusate, sodium phosphate, zolpidem  Past Medical History:  Diagnosis Date   AICD (automatic cardioverter/defibrillator) present    [employer]   Allergic rhinitis    Anemia    Arthritis    Chronic systolic heart failure (HCC)    a. ECHO (12/2012) EF 25-30%, HK entireanteroseptal myocardium //  b.  EF 25%, diffuse HK, grade 1 diastolic dysfunction, MAC, mild LAE, normal RVSF, trivial pericardial effusion   COPD (chronic obstructive pulmonary disease) (HCC)    Diabetes mellitus type II     Diabetic nephropathy (HCC)    Diabetic neuropathy (HCC)    ESRD on hemodialysis (HCC)    started HD June 2017, goes to [facility] HD unit, Dr Fausto Skillern   History of cardiac catheterization    a.Myoview 1/15:  There is significant left ventricular dysfunction. There may be slight scar at the apex. There is no significant ischemia. LV Ejection Fraction: 27%  //  b. RHC/LHC (1/15) with mean RA 6, PA 47/22 mean 33, mean PCWP 20, PVR 2.5 WU, CI 2.5; 80% dLAD stenosis,  70% diffuse large D.     History of kidney stones    Hyperlipidemia    Hypertension    Kidney stones    NICM (nonischemic cardiomyopathy) (HCC)    Primarily nonischemic.  Echo (12/14) with EF 25-30%.  Echo (3/15) with EF 25%, mild to moderately dilated LV, normal RV size and systolic function.     Osteomyelitis (HCC)    left fifth ray   Pneumonia    Urethral stricture    Wears glasses     Past Surgical History:  Procedure Laterality Date   ABDOMINAL AORTOGRAM W/LOWER EXTREMITY N/A 03/30/2016   Procedure: Abdominal Aortogram w/Lower Extremity;  Surgeon: Chuck Hint, MD;  Location: [facility] INVASIVE CV LAB;  Service: Cardiovascular;  Laterality: N/A;   AMPUTATION Right 04/26/2016   Procedure: Right Below Knee Amputation;  Surgeon: Nadara Mustard, MD;  Location: [facility] OR;  Service: Orthopedics;  Laterality: Right;   AMPUTATION Left 08/21/2019   Procedure: LEFT FOOT 5TH RAY AMPUTATION;  Surgeon: Nadara Mustard, MD;  Location: [facility] OR;  Service: Orthopedics;  Laterality: Left;   AMPUTATION Left 11/13/2019   Procedure: LEFT BELOW KNEE AMPUTATION;  Surgeon: Nadara Mustard, MD;  Location: [facility] OR;  Service: Orthopedics;  Laterality: Left;   AV FISTULA PLACEMENT Right 09/08/2015   Procedure: INSERTION OF 4-25mm x 45cm  ARTERIOVENOUS (AV) GORE-TEX GRAFT RIGHT UPPER  ARM;  Surgeon: Chuck Hint, MD;  Location: [facility] OR;  Service: Vascular;  Laterality: Right;   AV FISTULA PLACEMENT Left 01/14/2016   Procedure: CREATION OF LEFT UPPER ARM  ARTERIOVENOUS FISTULA;  Surgeon: Chuck Hint, MD;  Location: [facility] OR;  Service: Vascular;  Laterality: Left;   BASCILIC VEIN TRANSPOSITION Right 08/22/2014   Procedure: RIGHT UPPER ARM BASCILIC VEIN TRANSPOSITION;  Surgeon: Chuck Hint, MD;  Location: [facility] OR;  Service: Vascular;  Laterality: Right;   BELOW KNEE LEG AMPUTATION Right 04/26/2016   CARDIAC CATHETERIZATION     CARDIAC DEFIBRILLATOR PLACEMENT  06/27/2013   Sub Q       BY DR Graciela Husbands   CATARACT EXTRACTION W/PHACO Right 08/06/2018   Procedure: CATARACT EXTRACTION PHACO AND INTRAOCULAR LENS PLACEMENT (IOC);  Surgeon: Fabio Pierce, MD;  Location: [facility] ORS;  Service: Ophthalmology;  Laterality: Right;  CDE: 4.06   CATARACT EXTRACTION W/PHACO Left 08/20/2018   Procedure: CATARACT EXTRACTION PHACO AND INTRAOCULAR LENS PLACEMENT (IOC);  Surgeon: Fabio Pierce, MD;  Location: [facility] ORS;  Service: Ophthalmology;  Laterality: Left;  CDE: 6.76   COLONOSCOPY WITH PROPOFOL N/A 07/22/2015   Procedure: COLONOSCOPY WITH PROPOFOL;  Surgeon: Sherrilyn Rist, MD;  Location: [facility] ENDOSCOPY;  Service: Gastroenterology;  Laterality: N/A;   FEMORAL-POPLITEAL BYPASS GRAFT Right 03/31/2016   Procedure: BYPASS GRAFT FEMORAL-POPLITEAL ARTERY USING RIGHT GREATER SAPHENOUS NONREVERSED VEIN;  Surgeon: Chuck Hint, MD;  Location: [facility] OR;  Service: Vascular;  Laterality: Right;   HERNIA REPAIR     I & D EXTREMITY Right 03/31/2016   Procedure: IRRIGATION AND DEBRIDEMENT FOOT;  Surgeon: Chuck Hint, MD;  Location: [facility] OR;  Service: Vascular;  Laterality: Right;   IMPLANTABLE CARDIOVERTER DEFIBRILLATOR IMPLANT N/A 06/27/2013   Procedure: SUB Q ICD;  Surgeon: Duke Salvia, MD;  Location: [facility] CATH LAB;  Service: Cardiovascular;  Laterality: N/A;   INTRAOPERATIVE ARTERIOGRAM Right 03/31/2016   Procedure: INTRA OPERATIVE ARTERIOGRAM;  Surgeon: Chuck Hint, MD;  Location: [facility] OR;  Service: Vascular;  Laterality: Right;   IR GENERIC HISTORICAL  Right 11/30/2015   IR THROMBECTOMY  AV FISTULA W/THROMBOLYSIS/PTA INC/SHUNT/IMG RIGHT 11/30/2015 Irish Lack, MD [facility]-INTER[facility] RAD   IR GENERIC HISTORICAL  11/30/2015   IR US GUIDE VASC ACCESS RIGHT 11/30/2015 Irish Lack, MD [facility]   IR GENERIC HISTORICAL Right 12/15/2015   IR THROMBECTOMY AV FISTULA W/THROMBOLYSIS/PTA/STENT INC/SHUNT/IMG RT 12/15/2015 Oley Balm, MD [facility]   IR GENERIC HISTORICAL  12/15/2015   IR US GUIDE VASC ACCESS RIGHT 12/15/2015 Oley Balm, MD [facility]   IR GENERIC HISTORICAL  12/28/2015   IR FLUORO GUIDE CV LINE RIGHT 12/28/2015 Jolaine Click, MD [facility]   IR GENERIC HISTORICAL  12/28/2015   IR US GUIDE VASC ACCESS RIGHT 12/28/2015 Jolaine Click, MD [facility]   LEFT A ND RIGHT HEART CATH  01/30/2013   DR Jones Broom   LEFT AND RIGHT HEART CATHETERIZATION WITH CORONARY ANGIOGRAM N/A 01/30/2013   Procedure: LEFT AND RIGHT HEART CATHETERIZATION WITH CORONARY ANGIOGRAM;  Surgeon: Dolores Patty, MD;  Location: [facility] CATH LAB;  Service: Cardiovascular;  Laterality: N/A;   PERIPHERAL VASCULAR CATHETERIZATION Right 01/26/2015   Procedure: A/V Fistulagram;  Surgeon: Chuck Hint, MD;  Location: [facility] INVASIVE CV LAB;  Service: Cardiovascular;  Laterality: Right;   reapea urethral surgery for recurrent obstruction  2011   TOTAL KNEE ARTHROPLASTY Right 2007   VEIN HARVEST Right 03/31/2016   Procedure: RIGHT GREATER SAPHENOUS VEIN HARVEST;  Surgeon: Chuck Hint, MD;  Location: [facility] OR;  Service: Vascular;  Laterality: Right;     reports that he quit smoking about 12 years ago. His smoking use included cigarettes. He started smoking about 44 years ago. He has a 64 pack-year smoking history. He has never used smokeless tobacco. He reports that he does not drink alcohol and does not use drugs.   Family History  Problem Relation Age of Onset   Bladder Cancer Mother    Alcohol abuse Father    Melanoma Father    Stroke Maternal  Grandmother    Heart Problems Maternal Grandmother        unknown   Diabetes Maternal Grandmother    Heart disease Maternal Grandfather    Prostate cancer Maternal Grandfather     Physical Exam:   Vitals:   09/24/22 0800 09/24/22 0845 09/24/22 0855 09/24/22 1019  BP: 130/60 127/71  (!) 117/59  Pulse: (!) 105 (!) 106 (!) 102 (!) 107  Resp: 18 20 17  (!) 22  Temp:    97.9 F (36.6 C)  TempSrc:    Oral  SpO2: 100% 99% 98% 98%  Weight:       Constitutional: NAD, calm, comfortable Eyes: PERRL, lids and conjunctivae normal ENMT: Mucous membranes are moist. Posterior pharynx clear of any exudate or lesions.Normal dentition.  Neck: normal, supple, no masses, no thyromegaly Respiratory: Bilateral mid to lower lobe Rales with some crackles clear to auscultation bilaterally, no wheezing,  Normal respiratory effort. No accessory muscle use.  Cardiovascular: Regular rate and rhythm, no murmurs / rubs / gallops. No extremity edema. 2+ pedal pulses. No carotid bruits.  Abdomen: no tenderness, no masses palpated. No hepatosplenomegaly. Bowel sounds positive.  Musculoskeletal: Bilateral below-knee amputee -no clubbing / cyanosis. No joint deformity upper and lower extremities. Good ROM, no contractures. Normal muscle tone.  Neurologic: CN II-XII grossly intact. Sensation intact, DTR normal. Strength 5/5 in all 4.  Psychiatric: Normal judgment and insight. Alert and oriented x 3. Normal mood.  Skin: no rashes, lesions, ulcers. No induration   Pressure Injury 11/16/19 Buttocks Left Stage 2 -  Partial thickness loss  of dermis presenting as a shallow open injury with a red, pink wound bed without slough. dry shiny nonblanchable area what appears to be a healing stage 2 (Active)  11/16/19 1435  Location: Buttocks  Location Orientation: Left  Staging: Stage 2 -  Partial thickness loss of dermis presenting as a shallow open injury with a red, pink wound bed without slough.  Wound Description  (Comments): dry shiny nonblanchable area what appears to be a healing stage 2  Present on Admission: Yes     Pressure Injury 11/16/19 Buttocks Right Stage 2 -  Partial thickness loss of dermis presenting as a shallow open injury with a red, pink wound bed without slough. nonblanchable area what appears to be a healing stage 2 with dry flacky skin covering (Active)  11/16/19 1435  Location: Buttocks  Location Orientation: Right  Staging: Stage 2 -  Partial thickness loss of dermis presenting as a shallow open injury with a red, pink wound bed without slough.  Wound Description (Comments): nonblanchable area what appears to be a healing stage 2 with dry flacky skin covering  Present on Admission: Yes         Labs on admission:    I have personally reviewed following labs and imaging studies  CBC: Recent Labs  Lab 09/24/22 0533  WBC 11.9*  NEUTROABS 9.5*  HGB 12.0*  HCT 37.5*  MCV 93.5  PLT 149*   Basic Metabolic Panel: Recent Labs  Lab 09/24/22 0533  NA 134*  K 3.3*  CL 96*  CO2 24  GLUCOSE 264*  BUN 43*  CREATININE 8.65*  CALCIUM 7.7*  MG 1.9  PHOS 4.8*   GFR: Estimated Creatinine Clearance: 10.7 mL/min (A) (by C-G formula based on SCr of 8.65 mg/dL (H)). Liver Function Tests: Recent Labs  Lab 09/24/22 0533  AST 22  ALT 22  ALKPHOS 73  BILITOT 0.8  PROT 6.0*  ALBUMIN 2.8*   No results for input(s): "LIPASE", "AMYLASE" in the last 168 hours. No results for input(s): "AMMONIA" in the last 168 hours. Coagulation Profile: Recent Labs  Lab 09/24/22 0533  INR 1.3*    Urine analysis:    Component Value Date/Time   COLORURINE yellow 09/28/2009 1317   APPEARANCEUR Clear 09/28/2009 1317   LABSPEC 1.010 09/28/2009 1317   PHURINE 5.0 09/28/2009 1317   HGBUR 2+ 09/28/2009 1317   BILIRUBINUR negative 03/17/2016 1648   PROTEINUR trace 03/17/2016 1648   UROBILINOGEN 0.2 03/17/2016 1648   UROBILINOGEN 0.2 09/28/2009 1317   NITRITE negative 03/17/2016  1648   NITRITE negative 09/28/2009 1317   LEUKOCYTESUR small (1+) (A) 03/17/2016 1648    Last A1C:  Lab Results  Component Value Date   HGBA1C 8.7 (A) 06/24/2022     Radiologic Exams on Admission:   DG Chest Port 1 View  Result Date: 09/24/2022 CLINICAL DATA:  Shortness of breath. EXAM: PORTABLE CHEST 1 VIEW COMPARISON:  09/07/2022 FINDINGS: The cardio pericardial silhouette is enlarged. Vascular congestion with diffuse interstitial opacity suggests edema. There is some minimal retrocardiac atelectasis or infiltrate. Probable tiny bilateral pleural effusions. Right IJ central line tip again noted with tip overlying the mid to distal SVC level. Defer bladder device remains in place. Telemetry leads overlie the chest. IMPRESSION: 1. Vascular congestion with diffuse interstitial opacity suggesting edema. 2. Probable tiny bilateral pleural effusions. Electronically Signed   By: Kennith Center M.D.   On: 09/24/2022 06:20    EKG:   Independently reviewed.  Orders placed or performed during the  hospital encounter of 09/24/22   ED EKG   ED EKG   EKG 12-Lead   EKG 12-Lead   EKG 12-Lead   *Note: Due to a large number of results and/or encounters for the requested time period, some results have not been displayed. A complete set of results can be found in Results Review.   ---------------------------------------------------------------------------------------------------------------------------------------     Consults called: Nephrologist -------------------------------------------------------------------------------------------------------------------------------------------- DVT prophylaxis:  TED hose Start: 09/24/22 0803 SCDs Start: 09/24/22 0803 apixaban (ELIQUIS) tablet 5 mg   Code Status:   Code Status: Full Code   Admission status: Patient will be admitted as Observation, with a less than 2 midnight length of stay. Level of care: Telemetry   Family Communication:  none  at bedside  (The above findings and plan of care has been discussed with patient in detail, the patient expressed understanding and agreement of above plan)  --------------------------------------------------------------------------------------------------------------------------------------------------  Disposition Plan:  Anticipated 1-2 days Status is: Observation The patient remains OBS appropriate and will d/c before 2 midnights.     ----------------------------------------------------------------------------------------------------------------------------------------------------  Time spent:  33  Min.  Was spent seeing and evaluating the patient, reviewing all medical records, drawn plan of care.  SIGNED: Kendell Bane, MD, FHM. FAAFP. [facility], Pager  (Please use [email or web address] to page/ or secure chat through epic) If 7PM-7AM, please contact night-coverage [email or web address],  09/24/2022, 10:28 AM

## 2022-09-24 NOTE — Hospital Course (Addendum)
Anthony Bullock is a 62 year old male with a history of ESRD on HD TTS, bilateral BKA, chronic S CHF, HFrEF  25-40%, COPD, DM II, neuropathy, HLD, HTN, nonischemic cardiomyopathy, OA, A-fib... Presented today with worsening shortness of breath, and swelling, and palpitation. Recent hospitalization and treatment for COVID infection status posttreatment.  ED evaluation/course:  Blood pressure 127/71, pulse (!) 102, temperature 97.7 F (36.5 C), RR 17, weight 97 kg, SpO2 98%. Patient was due for dialysis today presented to ED in acute respiratory failure was placed on BiPAP subsequently weaned down to O2 by nasal cannula, was found in A-fib with RVR heart rate around 150- 110 Labs: WBC 11.9, hemoglobin 12.0, platelets 149, sodium 143, potassium 3.3, BUN 43, creatinine 8.65, GFR 6, BNP 970  EDP has discussed the case with nephrologist Dr. Verna Czech who proceed with hemodialysis today -Patient's home medication will be restarted, heart rate is improving including his home dose of beta-blockers  Patient will be admitted for hemodialysis, close monitoring Of symptoms improve anticipating discharge later today.    Assessment / Plan:   Principal Problem:   Volume overload Active Problems:   Atrial fibrillation (HCC)   Acute on chronic HFrEF (heart failure with reduced ejection fraction) (HCC)   ESRD on dialysis [facility])   Essential hypertension   Hyperlipidemia   COPD (chronic obstructive pulmonary disease) (HCC)   CAD (coronary artery disease)   S/P bilateral below knee amputation (HCC)   Diabetic peripheral neuropathy (HCC)   NICM (nonischemic cardiomyopathy) (HCC)   Acute respiratory failure with hypoxia (HCC)   Diabetes mellitus with complication (HCC)   Acute on chronic respiratory failure -due to volume overload -Was initially placed on BiPAP, currently weaned down to O2 by nasal cannula 98% -Initiating hemodialysis per nephrology this a.m. -DuoNeb bronchodilators  ESRD -Scheduled  hemodialysis TTS, -Patient is volume overload, was due for hemodialysis today -Nephrologist Dr. Verna Czech has been notified by EDP, initiating hemodialysis this morning   Acute on chronic combined systolic and diastolic heart failure HFrEF -Most recent ejection fraction 30 to 35% on August 22, 2022 -Continue daily weights, strict I's and O's and low sodium diet -Volume management with hemodialysis -Patient GDMT is limited by low blood pressure -Continue beta-blocker. -BNP 970   A-fib with RVR  W h/o paroxysmal atrial fibrillation -Continue apixaban and continue beta-blocker -Exacerbated by respiratory distress, due to volume overload -Will utilize as needed IV metoprolol, amiodarone   Hypokalemia  -Monitoring repleted with p.o. potassium -Current serum potassium 3.3,, magnesium 1.9  history of coronary artery disease -Patient reports no chest pain -Continue beta-blocker and apixaban.   History of COPD -She was placed on BiPAP, currently weaned off to O2 via nasal cannula, satting 98% -Symptoms more likely related to volume overload than COPD exacerbation -Continue DuoNeb bronchodilator -Withholding IV steroids use   Peripheral arterial disease -Status post bilateral BKA -Continue Eliquis -Continue Statins and rifaximin medication.   Hypertension -Continue carvedilol -Soft blood pressure currently appreciated    Uncontrolled type II diabetes -A1c 8.7 -SSI and Lantus

## 2022-09-24 NOTE — ED Notes (Signed)
ED TO INPATIENT HANDOFF REPORT  ED Nurse Name and Phone #:  Casimiro Needle [phone]  S Name/Age/Gender Anthony Bullock 62 y.o. male Room/Bed: APA01/APA01  Code Status   Code Status: Full Code  Home/SNF/Other Home Patient oriented to: self, place, situation, and time Is this baseline? Yes   Triage Complete: Triage complete  Chief Complaint Volume overload [E87.70]  Triage Note Patient BIB [facility] c/o shob that started tonight.  Patient reports he is 2 weeks post covid.  EMS reports patient initially in a fib RVR at 120-150 which went down to a fib 110-120 after placing patient on cpap. Patient denies missing dialysis.    Allergies Allergies  Allergen Reactions   Epoetin Alfa Other (See Comments)    unknown   Ferumoxytol Other (See Comments)    unknown   Morphine Sulfate Rash and Other (See Comments)    Itches all over, red spots    Level of Care/Admitting Diagnosis ED Disposition     ED Disposition  Admit   Condition  --   Comment  Hospital Area: [facility] [[id number]]  Level of Care: Telemetry [5]  Covid Evaluation: Asymptomatic - no recent exposure (last 10 days) testing not required  Diagnosis: Volume overload [454098]  Admitting Physician: Kendell Bane [[id number]]  Attending Physician: Felipa Furnace          B Medical/Surgery History Past Medical History:  Diagnosis Date   AICD (automatic cardioverter/defibrillator) present    [employer]   Allergic rhinitis    Anemia    Arthritis    Chronic systolic heart failure (HCC)    a. ECHO (12/2012) EF 25-30%, HK entireanteroseptal myocardium //  b.  EF 25%, diffuse HK, grade 1 diastolic dysfunction, MAC, mild LAE, normal RVSF, trivial pericardial effusion   COPD (chronic obstructive pulmonary disease) (HCC)    Diabetes mellitus type II    Diabetic nephropathy (HCC)    Diabetic neuropathy (HCC)    ESRD on hemodialysis (HCC)    started HD June 2017, goes to [facility] HD unit, Dr  Fausto Skillern   History of cardiac catheterization    a.Myoview 1/15:  There is significant left ventricular dysfunction. There may be slight scar at the apex. There is no significant ischemia. LV Ejection Fraction: 27%  //  b. RHC/LHC (1/15) with mean RA 6, PA 47/22 mean 33, mean PCWP 20, PVR 2.5 WU, CI 2.5; 80% dLAD stenosis, 70% diffuse large D.     History of kidney stones    Hyperlipidemia    Hypertension    Kidney stones    NICM (nonischemic cardiomyopathy) (HCC)    Primarily nonischemic.  Echo (12/14) with EF 25-30%.  Echo (3/15) with EF 25%, mild to moderately dilated LV, normal RV size and systolic function.     Osteomyelitis (HCC)    left fifth ray   Pneumonia    Urethral stricture    Wears glasses    Past Surgical History:  Procedure Laterality Date   ABDOMINAL AORTOGRAM W/LOWER EXTREMITY N/A 03/30/2016   Procedure: Abdominal Aortogram w/Lower Extremity;  Surgeon: Chuck Hint, MD;  Location: [facility] INVASIVE CV LAB;  Service: Cardiovascular;  Laterality: N/A;   AMPUTATION Right 04/26/2016   Procedure: Right Below Knee Amputation;  Surgeon: Nadara Mustard, MD;  Location: [facility] OR;  Service: Orthopedics;  Laterality: Right;   AMPUTATION Left 08/21/2019   Procedure: LEFT FOOT 5TH RAY AMPUTATION;  Surgeon: Nadara Mustard, MD;  Location: [facility] OR;  Service: Orthopedics;  Laterality:  Left;   AMPUTATION Left 11/13/2019   Procedure: LEFT BELOW KNEE AMPUTATION;  Surgeon: Nadara Mustard, MD;  Location: [facility] OR;  Service: Orthopedics;  Laterality: Left;   AV FISTULA PLACEMENT Right 09/08/2015   Procedure: INSERTION OF 4-40mm x 45cm  ARTERIOVENOUS (AV) GORE-TEX GRAFT RIGHT UPPER  ARM;  Surgeon: Chuck Hint, MD;  Location: [facility] OR;  Service: Vascular;  Laterality: Right;   AV FISTULA PLACEMENT Left 01/14/2016   Procedure: CREATION OF LEFT UPPER ARM ARTERIOVENOUS FISTULA;  Surgeon: Chuck Hint, MD;  Location: [facility] OR;  Service: Vascular;  Laterality: Left;   BASCILIC VEIN TRANSPOSITION Right  08/22/2014   Procedure: RIGHT UPPER ARM BASCILIC VEIN TRANSPOSITION;  Surgeon: Chuck Hint, MD;  Location: [facility] OR;  Service: Vascular;  Laterality: Right;   BELOW KNEE LEG AMPUTATION Right 04/26/2016   CARDIAC CATHETERIZATION     CARDIAC DEFIBRILLATOR PLACEMENT  06/27/2013   Sub Q       BY DR Graciela Husbands   CATARACT EXTRACTION W/PHACO Right 08/06/2018   Procedure: CATARACT EXTRACTION PHACO AND INTRAOCULAR LENS PLACEMENT (IOC);  Surgeon: Fabio Pierce, MD;  Location: [facility] ORS;  Service: Ophthalmology;  Laterality: Right;  CDE: 4.06   CATARACT EXTRACTION W/PHACO Left 08/20/2018   Procedure: CATARACT EXTRACTION PHACO AND INTRAOCULAR LENS PLACEMENT (IOC);  Surgeon: Fabio Pierce, MD;  Location: [facility] ORS;  Service: Ophthalmology;  Laterality: Left;  CDE: 6.76   COLONOSCOPY WITH PROPOFOL N/A 07/22/2015   Procedure: COLONOSCOPY WITH PROPOFOL;  Surgeon: Sherrilyn Rist, MD;  Location: [facility] ENDOSCOPY;  Service: Gastroenterology;  Laterality: N/A;   FEMORAL-POPLITEAL BYPASS GRAFT Right 03/31/2016   Procedure: BYPASS GRAFT FEMORAL-POPLITEAL ARTERY USING RIGHT GREATER SAPHENOUS NONREVERSED VEIN;  Surgeon: Chuck Hint, MD;  Location: [facility] OR;  Service: Vascular;  Laterality: Right;   HERNIA REPAIR     I & D EXTREMITY Right 03/31/2016   Procedure: IRRIGATION AND DEBRIDEMENT FOOT;  Surgeon: Chuck Hint, MD;  Location: [facility] OR;  Service: Vascular;  Laterality: Right;   IMPLANTABLE CARDIOVERTER DEFIBRILLATOR IMPLANT N/A 06/27/2013   Procedure: SUB Q ICD;  Surgeon: Duke Salvia, MD;  Location: [facility] CATH LAB;  Service: Cardiovascular;  Laterality: N/A;   INTRAOPERATIVE ARTERIOGRAM Right 03/31/2016   Procedure: INTRA OPERATIVE ARTERIOGRAM;  Surgeon: Chuck Hint, MD;  Location: [facility] OR;  Service: Vascular;  Laterality: Right;   IR GENERIC HISTORICAL Right 11/30/2015   IR THROMBECTOMY AV FISTULA W/THROMBOLYSIS/PTA INC/SHUNT/IMG RIGHT 11/30/2015 Irish Lack, MD [facility]   IR GENERIC HISTORICAL   11/30/2015   IR US GUIDE VASC ACCESS RIGHT 11/30/2015 Irish Lack, MD [facility]   IR GENERIC HISTORICAL Right 12/15/2015   IR THROMBECTOMY AV FISTULA W/THROMBOLYSIS/PTA/STENT INC/SHUNT/IMG RT 12/15/2015 Oley Balm, MD [facility]   IR GENERIC HISTORICAL  12/15/2015   IR US GUIDE VASC ACCESS RIGHT 12/15/2015 Oley Balm, MD [facility]   IR GENERIC HISTORICAL  12/28/2015   IR FLUORO GUIDE CV LINE RIGHT 12/28/2015 Jolaine Click, MD [facility]   IR GENERIC HISTORICAL  12/28/2015   IR US GUIDE VASC ACCESS RIGHT 12/28/2015 Jolaine Click, MD [facility]   LEFT A ND RIGHT HEART CATH  01/30/2013   DR Jones Broom   LEFT AND RIGHT HEART CATHETERIZATION WITH CORONARY ANGIOGRAM N/A 01/30/2013   Procedure: LEFT AND RIGHT HEART CATHETERIZATION WITH CORONARY ANGIOGRAM;  Surgeon: Dolores Patty, MD;  Location: [facility] CATH LAB;  Service: Cardiovascular;  Laterality: N/A;   PERIPHERAL VASCULAR CATHETERIZATION Right 01/26/2015   Procedure: A/V Fistulagram;  Surgeon:  Chuck Hint, MD;  Location: [facility] INVASIVE CV LAB;  Service: Cardiovascular;  Laterality: Right;   reapea urethral surgery for recurrent obstruction  2011   TOTAL KNEE ARTHROPLASTY Right 2007   VEIN HARVEST Right 03/31/2016   Procedure: RIGHT GREATER SAPHENOUS VEIN HARVEST;  Surgeon: Chuck Hint, MD;  Location: [facility] OR;  Service: Vascular;  Laterality: Right;     A IV Location/Drains/Wounds Patient Lines/Drains/Airways Status     Active Line/Drains/Airways     Name Placement date Placement time Site Days   Peripheral IV 09/24/22 20 G Posterior;Right Hand 09/24/22  0558  Hand  less than 1   Fistula / Graft Right Upper arm Arteriovenous vein graft 09/08/15  0808  Upper arm  2573   Fistula / Graft Left Upper arm Arteriovenous fistula 01/14/16  1007  Upper arm  2445   Hemodialysis Catheter Right Internal jugular Double-lumen;Permanent 12/28/15  1732  Internal jugular  2462   Pressure Injury 11/16/19 Buttocks Left Stage  2 -  Partial thickness loss of dermis presenting as a shallow open injury with a red, pink wound bed without slough. dry shiny nonblanchable area what appears to be a healing stage 2 11/16/19  1435  -- 1043   Pressure Injury 11/16/19 Buttocks Right Stage 2 -  Partial thickness loss of dermis presenting as a shallow open injury with a red, pink wound bed without slough. nonblanchable area what appears to be a healing stage 2 with dry flacky skin covering 11/16/19  1435  -- 1043   Wound / Incision (Open or Dehisced) 09/03/19 Diabetic ulcer Heel Left 09/03/19  1400  Heel  1117            Intake/Output Last 24 hours No intake or output data in the 24 hours ending 09/24/22 0981  Labs/Imaging Results for orders placed or performed during the hospital encounter of 09/24/22 (from the past 48 hour(s))  CBC with Differential/Platelet     Status: Abnormal   Collection Time: 09/24/22  5:33 AM  Result Value Ref Range   WBC 11.9 (H) 4.0 - 10.5 K/uL   RBC 4.01 (L) 4.22 - 5.81 MIL/uL   Hemoglobin 12.0 (L) 13.0 - 17.0 g/dL   HCT 19.1 (L) 47.8 - 29.5 %   MCV 93.5 80.0 - 100.0 fL   MCH 29.9 26.0 - 34.0 pg   MCHC 32.0 30.0 - 36.0 g/dL   RDW 62.1 (H) 30.8 - 65.7 %   Platelets 149 (L) 150 - 400 K/uL   nRBC 0.0 0.0 - 0.2 %   Neutrophils Relative % 80 %   Neutro Abs 9.5 (H) 1.7 - 7.7 K/uL   Lymphocytes Relative 8 %   Lymphs Abs 1.0 0.7 - 4.0 K/uL   Monocytes Relative 9 %   Monocytes Absolute 1.0 0.1 - 1.0 K/uL   Eosinophils Relative 2 %   Eosinophils Absolute 0.2 0.0 - 0.5 K/uL   Basophils Relative 0 %   Basophils Absolute 0.0 0.0 - 0.1 K/uL   Immature Granulocytes 1 %   Abs Immature Granulocytes 0.06 0.00 - 0.07 K/uL    Comment: Performed at [facility], [address], [place], [place] [address]  Comprehensive metabolic panel     Status: Abnormal   Collection Time: 09/24/22  5:33 AM  Result Value Ref Range   Sodium 134 (L) 135 - 145 mmol/L   Potassium 3.3 (L) 3.5 - 5.1 mmol/L   Chloride 96  (L) 98 - 111 mmol/L   CO2 24 22 -  32 mmol/L   Glucose, Bld 264 (H) 70 - 99 mg/dL    Comment: Glucose reference range applies only to samples taken after fasting for at least 8 hours.   BUN 43 (H) 8 - 23 mg/dL   Creatinine, Ser 4.03 (H) 0.61 - 1.24 mg/dL   Calcium 7.7 (L) 8.9 - 10.3 mg/dL   Total Protein 6.0 (L) 6.5 - 8.1 g/dL   Albumin 2.8 (L) 3.5 - 5.0 g/dL   AST 22 15 - 41 U/L   ALT 22 0 - 44 U/L   Alkaline Phosphatase 73 38 - 126 U/L   Total Bilirubin 0.8 0.3 - 1.2 mg/dL   GFR, Estimated 6 (L) >60 mL/min    Comment: (NOTE) Calculated using the CKD-EPI Creatinine Equation (2021)    Anion gap 14 5 - 15    Comment: Performed at [facility], [address] [place], [place] [address]  Brain natriuretic peptide     Status: Abnormal   Collection Time: 09/24/22  5:33 AM  Result Value Ref Range   B Natriuretic Peptide 970.0 (H) 0.0 - 100.0 pg/mL    Comment: Performed at [facility], [address] [place], [place] [address]  Troponin I (High Sensitivity)     Status: Abnormal   Collection Time: 09/24/22  5:33 AM  Result Value Ref Range   Troponin I (High Sensitivity) 43 (H) <18 ng/L    Comment: (NOTE) Elevated high sensitivity troponin I (hsTnI) values and significant  changes across serial measurements may suggest ACS but many other  chronic and acute conditions are known to elevate hsTnI results.  Refer to the "Links" section for chest pain algorithms and additional  guidance. Performed at [facility], [address], [place], [place] [address]   Magnesium     Status: None   Collection Time: 09/24/22  5:33 AM  Result Value Ref Range   Magnesium 1.9 1.7 - 2.4 mg/dL    Comment: Performed at [facility], [address] [place], [place] [address]  Phosphorus     Status: Abnormal   Collection Time: 09/24/22  5:33 AM  Result Value Ref Range   Phosphorus 4.8 (H) 2.5 - 4.6 mg/dL    Comment: Performed at [facility], [address], [place], [place] [address]  Protime-INR      Status: Abnormal   Collection Time: 09/24/22  5:33 AM  Result Value Ref Range   Prothrombin Time 16.1 (H) 11.4 - 15.2 seconds   INR 1.3 (H) 0.8 - 1.2    Comment: (NOTE) INR goal varies based on device and disease states. Performed at [facility], [address], [place], [place] [address]   Blood gas, venous     Status: Abnormal   Collection Time: 09/24/22  5:43 AM  Result Value Ref Range   pH, Ven 7.41 7.25 - 7.43   pCO2, Ven 49 44 - 60 mmHg   pO2, Ven <31 (LL) 32 - 45 mmHg    Comment: CRITICAL RESULT CALLED TO, READ BACK BY AND VERIFIED WITH: NICOLE D @ 1601 ON [id number] BY HENDERSON L    Bicarbonate 31.0 (H) 20.0 - 28.0 mmol/L   Acid-Base Excess 5.0 (H) 0.0 - 2.0 mmol/L   O2 Saturation 43.3 %   Patient temperature 36.5    Collection site BLOOD RIGHT HAND    Drawn by Katrinka Blazing RN     Comment: Performed at [facility], [address] [place], [place] [address]  Troponin I (High Sensitivity)  Status: Abnormal   Collection Time: 09/24/22  7:19 AM  Result Value Ref Range   Troponin I (High Sensitivity) 49 (H) <18 ng/L    Comment: (NOTE) Elevated high sensitivity troponin I (hsTnI) values and significant  changes across serial measurements may suggest ACS but many other  chronic and acute conditions are known to elevate hsTnI results.  Refer to the "Links" section for chest pain algorithms and additional  guidance. Performed at [facility], [address] [place], [place] [address]    *Note: Due to a large number of results and/or encounters for the requested time period, some results have not been displayed. A complete set of results can be found in Results Review.   DG Chest Port 1 View  Result Date: 09/24/2022 CLINICAL DATA:  Shortness of breath. EXAM: PORTABLE CHEST 1 VIEW COMPARISON:  09/07/2022 FINDINGS: The cardio pericardial silhouette is enlarged. Vascular congestion with diffuse interstitial opacity suggests edema. There is some minimal retrocardiac atelectasis or  infiltrate. Probable tiny bilateral pleural effusions. Right IJ central line tip again noted with tip overlying the mid to distal SVC level. Defer bladder device remains in place. Telemetry leads overlie the chest. IMPRESSION: 1. Vascular congestion with diffuse interstitial opacity suggesting edema. 2. Probable tiny bilateral pleural effusions. Electronically Signed   By: Kennith Center M.D.   On: 09/24/2022 06:20    Pending Labs Unresulted Labs (From admission, onward)     Start     Ordered   09/25/22 0500  Basic metabolic panel  Daily,   R      09/24/22 0804   09/25/22 0500  CBC  Daily,   R      09/24/22 0804   09/25/22 0500  APTT  Tomorrow morning,   R        09/24/22 0804   09/24/22 0816  Hepatitis B surface antigen  (New Admission Hemo Labs (Hepatitis B))  Once,   R        09/24/22 0815   09/24/22 0816  Hepatitis B surface antibody,quantitative  (New Admission Hemo Labs (Hepatitis B))  Once,   R        09/24/22 0815            Vitals/Pain Today's Vitals   09/24/22 0730 09/24/22 0800 09/24/22 0845 09/24/22 0855  BP: 114/76 130/60 127/71   Pulse: 94 (!) 105 (!) 106 (!) 102  Resp: (!) 21 18 20 17   Temp:      TempSrc:      SpO2: 99% 100% 99% 98%  Weight:      PainSc:        Isolation Precautions No active isolations  Medications Medications  sodium chloride flush (NS) 0.9 % injection 3 mL (has no administration in time range)  sodium chloride flush (NS) 0.9 % injection 3 mL (has no administration in time range)  acetaminophen (TYLENOL) tablet 650 mg (has no administration in time range)    Or  acetaminophen (TYLENOL) suppository 650 mg (has no administration in time range)  oxyCODONE (Oxy IR/ROXICODONE) immediate release tablet 5 mg (has no administration in time range)  HYDROmorphone (DILAUDID) injection 0.5-1 mg (has no administration in time range)  zolpidem (AMBIEN) tablet 5 mg (has no administration in time range)  senna-docusate (Senokot-S) tablet 1 tablet (has  no administration in time range)  bisacodyl (DULCOLAX) EC tablet 5 mg (has no administration in time range)  sodium phosphate (FLEET) enema 1 enema (has no administration in time range)  ondansetron (ZOFRAN) tablet  4 mg (has no administration in time range)    Or  ondansetron (ZOFRAN) injection 4 mg (has no administration in time range)  ipratropium (ATROVENT) nebulizer solution 0.5 mg (has no administration in time range)  levalbuterol (XOPENEX) nebulizer solution 0.63 mg (has no administration in time range)  hydrALAZINE (APRESOLINE) injection 10 mg (has no administration in time range)  amiodarone (PACERONE) tablet 200 mg (has no administration in time range)  atorvastatin (LIPITOR) tablet 80 mg (has no administration in time range)  carvedilol (COREG) tablet 12.5 mg (has no administration in time range)  fenofibrate tablet 54 mg (has no administration in time range)  midodrine (PROAMATINE) tablet 10 mg (has no administration in time range)  nortriptyline (PAMELOR) capsule 25 mg (has no administration in time range)  cinacalcet (SENSIPAR) tablet 30 mg (has no administration in time range)  insulin glargine-yfgn (SEMGLEE) injection 15 Units (has no administration in time range)  lanthanum (FOSRENOL) chewable tablet 1,000 mg (has no administration in time range)  pantoprazole (PROTONIX) EC tablet 40 mg (has no administration in time range)  apixaban (ELIQUIS) tablet 5 mg (has no administration in time range)  gabapentin (NEURONTIN) capsule 600 mg (has no administration in time range)  multivitamin (RENA-VIT) tablet 1 tablet (has no administration in time range)  erythromycin ophthalmic ointment 1 Application (has no administration in time range)  lanthanum (FOSRENOL) chewable tablet 500 mg (has no administration in time range)  albuterol (PROVENTIL) (2.5 MG/3ML) 0.083% nebulizer solution 2.5 mg (has no administration in time range)    Mobility walks with person assist     Focused  Assessments Renal Assessment Handoff:  Hemodialysis Schedule: Hemodialysis Schedule: Tuesday/Thursday/Saturday Last Hemodialysis date and time: Thursday   Restricted appendage: right arm   R Recommendations: See Admitting Provider Note  Report given to:   Additional Notes:

## 2022-09-24 NOTE — ED Provider Notes (Signed)
[place] EMERGENCY DEPARTMENT AT [facility] Provider Note   CSN: [id number] Arrival date & time: 09/24/22  4098     History  Chief Complaint  Patient presents with   Shortness of Breath    Anthony Bullock is a 62 y.o. male.  Presents to the emergency department for evaluation of shortness of breath.  Patient reports that his breathing worsened tonight.  Patient diagnosed with COVID 2 weeks ago.  He does have a history of atrial fibrillation.  EMS report hypoxia with a heart rate in the 150s initially.  He was placed on CPAP and his heart rate came down to around 110.  Patient without chest pain.       Home Medications Prior to Admission medications   Medication Sig Start Date End Date Taking? Authorizing Provider  acetaminophen (TYLENOL) 325 MG tablet Take 1-2 tablets (325-650 mg total) by mouth every 4 (four) hours as needed for mild pain. 11/29/19   Love, Evlyn Kanner, PA-C  amiodarone (PACERONE) 200 MG tablet Take 1 tablet (200 mg total) by mouth daily for 10 days. 09/09/22 09/19/22  Vassie Loll, MD  apixaban (ELIQUIS) 5 MG TABS tablet Take 1 tablet (5 mg total) by mouth 2 (two) times daily. 08/26/22   Rodolph Bong, MD  ascorbic acid (VITAMIN C) 1000 MG tablet Take 1 tablet (1,000 mg total) by mouth daily. 09/10/22   Vassie Loll, MD  atorvastatin (LIPITOR) 80 MG tablet TAKE 1 TABLET BY MOUTH AT BEDTIME 06/24/22   Burchette, Elberta Fortis, MD  azelastine (ASTELIN) 0.1 % nasal spray Place 2 sprays into both nostrils 3 (three) times daily as needed for rhinitis. Use in each nostril as directed 03/10/14   Burchette, Elberta Fortis, MD  benzonatate (TESSALON PERLES) 100 MG capsule Take 1 capsule (100 mg total) by mouth 3 (three) times daily as needed for cough. 09/02/22   Burchette, Elberta Fortis, MD  carvedilol (COREG) 12.5 MG tablet Take 1 tablet (12.5 mg total) by mouth 2 (two) times daily. 08/26/22   Rodolph Bong, MD  cinacalcet (SENSIPAR) 30 MG tablet Take 1 tablet (30 mg total) by  mouth Every Tuesday,Thursday,and Saturday with dialysis. 11/30/19   Love, Evlyn Kanner, PA-C  Continuous Glucose Receiver (FREESTYLE LIBRE 3 READER) DEVI Use to check blood glucose TID 08/11/22   Burchette, Elberta Fortis, MD  Continuous Glucose Sensor (FREESTYLE LIBRE 14 DAY SENSOR) MISC USE AS DIRECTED EVERY 14 DAYS 05/11/22   Burchette, Elberta Fortis, MD  Continuous Glucose Sensor (FREESTYLE LIBRE 3 SENSOR) MISC Place 1 sensor on the skin every 14 days. Use to check glucose continuously 07/25/22   Burchette, Elberta Fortis, MD  erythromycin ophthalmic ointment Place 1 Application into the right eye at bedtime. 08/02/22   [provider]  fenofibrate 160 MG tablet TAKE 1 TABLET BY MOUTH ONCE DAILY 06/24/22   Burchette, Elberta Fortis, MD  fexofenadine (ALLEGRA) 180 MG tablet Take 180 mg by mouth daily as needed for allergies.    [provider]  gabapentin (NEURONTIN) 300 MG capsule TAKE 2 CAPSULES BY MOUTH TWICE DAILY . Patient taking differently: Take 600 mg by mouth 2 (two) times daily. 06/24/22   Burchette, Elberta Fortis, MD  glucose blood test strip Check 1 time daily. E11.9 One Touch Ultra Blue Test Strips 03/10/14   Burchette, Elberta Fortis, MD  HUMALOG KWIKPEN 200 UNIT/ML KwikPen Inject 2-28 Units into the skin See admin instructions. INJECT A MAXIMUM OF 28 UNITS SUBCUTANEOUSLY TWICE DAILY WITH LUNCH AND SUPPER PER  SLIDING SCALE. APPOINTMENT REQUIRED FOR FUTURE REFILLS 09/09/22   Vassie Loll, MD  Insulin Pen Needle (BD PEN NEEDLE NANO U/F) 32G X 4 MM MISC USE 1 PEN NEEDLE SUBCUTANEOUSLY WITH INSULIN 4 TIMES DAILY 12/04/19   Burchette, Elberta Fortis, MD  lanthanum (FOSRENOL) 500 MG chewable tablet Chew 500-1,000 mg by mouth See admin instructions. Take 1000 mg with meals three time a day and 500 mg with snacks    [provider]  LANTUS SOLOSTAR 100 UNIT/ML Solostar Pen INJECT 12 UNITS SUBCUTANEOUSLY AT BEDTIME Patient taking differently: Inject 14 Units into the skin at bedtime. 07/15/22   Burchette, Elberta Fortis, MD   midodrine (PROAMATINE) 10 MG tablet Take 10 mg by mouth 3 (three) times a week. 08/04/20   [provider]  multivitamin (RENA-VIT) TABS tablet Take 1 tablet by mouth once daily 07/06/21   Burchette, Elberta Fortis, MD  nortriptyline (PAMELOR) 25 MG capsule Take 1 capsule (25 mg total) by mouth at bedtime. 08/09/22   Sater, Pearletha Furl, MD  Olopatadine HCl 0.2 % SOLN Place 1 drop into both eyes daily as needed (for allergies).     [provider]  pantoprazole (PROTONIX) 40 MG tablet Take 1 tablet (40 mg total) by mouth daily. 09/09/22 09/09/23  Vassie Loll, MD  predniSONE (DELTASONE) 20 MG tablet Take 3 tablets by mouth x 1 day; then 2 tablets by mouth x 2 days; then 1 tablet by mouth x 3 days; then half tablet by mouth x 3 days and stop prednisone. 09/09/22   Vassie Loll, MD  tobramycin (TOBREX) 0.3 % ophthalmic solution Place 1 drop into the right eye every 6 (six) hours. 08/02/22   [provider]  VENTOLIN HFA 108 (90 Base) MCG/ACT inhaler Inhale 1-2 puffs into the lungs every 6 (six) hours as needed for wheezing or shortness of breath. 07/25/22   Burchette, Elberta Fortis, MD  zinc sulfate 220 (50 Zn) MG capsule Take 1 capsule (220 mg total) by mouth daily. 09/10/22   Vassie Loll, MD      Allergies    Epoetin alfa, Ferumoxytol, and Morphine sulfate    Review of Systems   Review of Systems  Physical Exam Updated Vital Signs BP (!) 130/90   Pulse (!) 103   Temp 97.7 F (36.5 C) (Axillary)   Resp 18   Wt 97 kg   SpO2 96%   BMI 29.83 kg/m  Physical Exam Vitals and nursing note reviewed.  Constitutional:      General: He is not in acute distress.    Appearance: He is well-developed.  HENT:     Head: Normocephalic and atraumatic.     Mouth/Throat:     Mouth: Mucous membranes are moist.  Eyes:     General: Vision grossly intact. Gaze aligned appropriately.     Extraocular Movements: Extraocular movements intact.     Conjunctiva/sclera: Conjunctivae normal.   Cardiovascular:     Rate and Rhythm: Normal rate and regular rhythm.     Pulses: Normal pulses.     Heart sounds: Normal heart sounds, S1 normal and S2 normal. No murmur heard.    No friction rub. No gallop.  Pulmonary:     Effort: Pulmonary effort is normal. Tachypnea present. No respiratory distress.     Breath sounds: Rales present.  Abdominal:     Palpations: Abdomen is soft.     Tenderness: There is no abdominal tenderness. There is no guarding or rebound.     Hernia: No hernia is  present.  Musculoskeletal:        General: No swelling.     Cervical back: Full passive range of motion without pain, normal range of motion and neck supple. No pain with movement, spinous process tenderness or muscular tenderness. Normal range of motion.     Right lower leg: No edema.     Left lower leg: No edema.     Right Lower Extremity: Right leg is amputated below knee.     Left Lower Extremity: Left leg is amputated below knee.  Skin:    General: Skin is warm and dry.     Capillary Refill: Capillary refill takes less than 2 seconds.     Findings: No ecchymosis, erythema, lesion or wound.  Neurological:     Mental Status: He is alert and oriented to person, place, and time.     GCS: GCS eye subscore is 4. GCS verbal subscore is 5. GCS motor subscore is 6.     Cranial Nerves: Cranial nerves 2-12 are intact.     Sensory: Sensation is intact.     Motor: Motor function is intact. No weakness or abnormal muscle tone.     Coordination: Coordination is intact.  Psychiatric:        Mood and Affect: Mood normal.        Speech: Speech normal.        Behavior: Behavior normal.     ED Results / Procedures / Treatments   Labs (all labs ordered are listed, but only abnormal results are displayed) Labs Reviewed  CBC WITH DIFFERENTIAL/PLATELET - Abnormal; Notable for the following components:      Result Value   WBC 11.9 (*)    RBC 4.01 (*)    Hemoglobin 12.0 (*)    HCT 37.5 (*)    RDW 17.7 (*)     Platelets 149 (*)    Neutro Abs 9.5 (*)    All other components within normal limits  COMPREHENSIVE METABOLIC PANEL - Abnormal; Notable for the following components:   Sodium 134 (*)    Potassium 3.3 (*)    Chloride 96 (*)    Glucose, Bld 264 (*)    BUN 43 (*)    Creatinine, Ser 8.65 (*)    Calcium 7.7 (*)    Total Protein 6.0 (*)    Albumin 2.8 (*)    GFR, Estimated 6 (*)    All other components within normal limits  BRAIN NATRIURETIC PEPTIDE - Abnormal; Notable for the following components:   B Natriuretic Peptide 970.0 (*)    All other components within normal limits  BLOOD GAS, VENOUS - Abnormal; Notable for the following components:   pO2, Ven <31 (*)    Bicarbonate 31.0 (*)    Acid-Base Excess 5.0 (*)    All other components within normal limits  TROPONIN I (HIGH SENSITIVITY) - Abnormal; Notable for the following components:   Troponin I (High Sensitivity) 43 (*)    All other components within normal limits    EKG EKG Interpretation Date/Time:  Saturday September 24 2022 05:37:50 EDT Ventricular Rate:  116 PR Interval:    QRS Duration:  122 QT Interval:  377 QTC Calculation: 524 R Axis:   -37  Text Interpretation: Atrial flutter with predominant 3:1 AV block Left bundle branch block Confirmed by Gilda Crease ([id number]) on 09/24/2022 5:40:30 AM  Radiology DG Chest Port 1 View  Result Date: 09/24/2022 CLINICAL DATA:  Shortness of breath. EXAM: PORTABLE CHEST 1 VIEW COMPARISON:  09/07/2022 FINDINGS: The cardio pericardial silhouette is enlarged. Vascular congestion with diffuse interstitial opacity suggests edema. There is some minimal retrocardiac atelectasis or infiltrate. Probable tiny bilateral pleural effusions. Right IJ central line tip again noted with tip overlying the mid to distal SVC level. Defer bladder device remains in place. Telemetry leads overlie the chest. IMPRESSION: 1. Vascular congestion with diffuse interstitial opacity suggesting edema. 2.  Probable tiny bilateral pleural effusions. Electronically Signed   By: Kennith Center M.D.   On: 09/24/2022 06:20    Procedures Procedures    Medications Ordered in ED Medications - No data to display  ED Course/ Medical Decision Making/ A&P                                 Medical Decision Making Amount and/or Complexity of Data Reviewed Labs: ordered. Decision-making details documented in ED Course. Radiology: ordered and independent interpretation performed. Decision-making details documented in ED Course. ECG/medicine tests: ordered and independent interpretation performed. Decision-making details documented in ED Course.   Differential diagnosis considered includes, but not limited to: Congestive heart failure/volume overload; pneumonia; COVID; ACS  Patient presents to the emergency department for evaluation of shortness of breath.  Patient has a history of end-stage renal disease, on dialysis.  He dialyzes Tuesday Thursday Saturday.  He has not missed any sessions.  He has had multiple admissions recently for respiratory failure.  He tested positive for COVID at his most recent admission at the end of August.  Patient had new onset atrial fibrillation causing volume overload in the beginning part of August.  He was found to have an ejection fraction of 30 to 35% at that time.  Patient currently in atrial fibrillation/atrial flutter.  He was reportedly very tachycardic for EMS, presumably secondary to respiratory distress.  Now that he has improved on BiPAP, heart rate has significantly improved.  Chest x-ray does show evidence of volume overload.  Venous gas with some element of hypoxia.  Will require repeat hospitalization.  He is due for dialysis today and at least some of his breathing difficulties volume overload.  Will need dialysis session.  CRITICAL CARE Performed by: Gilda Crease   Total critical care time: 35 minutes  Critical care time was exclusive of  separately billable procedures and treating other patients.  Critical care was necessary to treat or prevent imminent or life-threatening deterioration.  Critical care was time spent personally by me on the following activities: development of treatment plan with patient and/or surrogate as well as nursing, discussions with consultants, evaluation of patient's response to treatment, examination of patient, obtaining history from patient or surrogate, ordering and performing treatments and interventions, ordering and review of laboratory studies, ordering and review of radiographic studies, pulse oximetry and re-evaluation of patient's condition.          Final Clinical Impression(s) / ED Diagnoses Final diagnoses:  Acute respiratory failure with hypoxia [facility])    Rx / DC Orders ED Discharge Orders     None         Skylen Danielsen, Canary Brim, MD 09/24/22 [phone]

## 2022-09-24 NOTE — ED Notes (Signed)
Pt arrived back from dialysis with plan to DC from the ED, not be admitted to the floor. Pt wife is at bedside and will transport patient home. Pt is free from complaints at this time. Does not wish to take home admission medications states he will take them at home. Verbalizes d/c teaching and follow up.

## 2022-09-24 NOTE — Discharge Summary (Signed)
Physician Discharge Summary   Patient: Anthony Bullock MRN: [id number] DOB: 1960-06-15  Admit date:     09/24/2022  Discharge date: 09/24/22  Discharge Physician: Kendell Bane   PCP: Kristian Covey, MD   Recommendations at discharge:   Follow-up with your nephrologist, continue scheduled hemodialysis TTS Continue taking your home medication with no changes with exception of taking carvedilol before dialysis to avoid A-fib  Discharge Diagnoses: Principal Problem:   Acute respiratory failure with hypoxia (HCC) Active Problems:   Atrial fibrillation (HCC)   Acute on chronic HFrEF (heart failure with reduced ejection fraction) (HCC)   Volume overload   ESRD on dialysis (HCC)   Essential hypertension   Hyperlipidemia   COPD (chronic obstructive pulmonary disease) (HCC)   CAD (coronary artery disease)   S/P bilateral below knee amputation (HCC)   Diabetic peripheral neuropathy (HCC)   NICM (nonischemic cardiomyopathy) (HCC)   Diabetes mellitus with complication (HCC)  Resolved Problems:   * No resolved hospital problems. *  Hospital Course: Anthony Bullock is a 62 year old male with a history of ESRD on HD TTS, bilateral BKA, chronic S CHF, HFrEF  25-40%, COPD, DM II, neuropathy, HLD, HTN, nonischemic cardiomyopathy, OA, A-fib... Presented today with worsening shortness of breath, and swelling, and palpitation. Recent hospitalization and treatment for COVID infection status posttreatment.  ED evaluation/course:  Blood pressure 127/71, pulse (!) 102, temperature 97.7 F (36.5 C), RR 17, weight 97 kg, SpO2 98%. Patient was due for dialysis today presented to ED in acute respiratory failure was placed on BiPAP subsequently weaned down to O2 by nasal cannula, was found in A-fib with RVR heart rate around 150- 110 Labs: WBC 11.9, hemoglobin 12.0, platelets 149, sodium 143, potassium 3.3, BUN 43, creatinine 8.65, GFR 6, BNP 970  EDP has discussed the case with nephrologist Dr.  Verna Czech who proceed with hemodialysis today -Patient's home medication will be restarted, heart rate is improving including his home dose of beta-blockers  Patient will be admitted for hemodialysis, close monitoring Of symptoms improve anticipating discharge later today.    Acute on chronic respiratory failure -Resolved postdialysis -due to volume overload -Was initially placed on BiPAP, currently weaned down to O2 by nasal cannula 98% -Initiating hemodialysis per nephrology this a.m. -DuoNeb bronchodilators  Responded well to hemodialysis, was weaned off supplemental oxygen, on room air satting 92%  ESRD Status post hemodialysis today 09/24/2022-tolerated well, back to baseline -Scheduled hemodialysis TTS, (Tuesdays Thursdays and Saturdays) -Patient is volume overload, was due for hemodialysis today -Nephrologist Dr. Verna Czech has been notified by EDP, hemodialysis this morning   Acute on chronic combined systolic and diastolic heart failure HFrEF -Most recent ejection fraction 30 to 35% on August 22, 2022 -Continue daily weights, strict I's and O's and low sodium diet -Volume management with hemodialysis -Patient GDMT is limited by low blood pressure -Continue beta-blocker. -BNP 970   A-fib with RVR  W h/o paroxysmal atrial fibrillation -Continue apixaban and continue carvedilol 12.5 mg twice daily (Show instructed to take the carvedilol after dialysis an hour recommended to take the medication in the morning before hemodialysis as patient tends to go to A-fib prior to dialysis)   -was Exacerbated by respiratory distress, due to volume overload, hypokalemia -Will utilize as needed IV metoprolol, amiodarone   Hypokalemia  -Monitoring repleted with p.o. potassium -Current serum potassium 3.3,, magnesium 1.9  history of coronary artery disease -Patient reports no chest pain -Continue beta-blocker and apixaban.   History of COPD -She was placed  on BiPAP, currently weaned  off to O2 via nasal cannula, satting 98% -Symptoms more likely related to volume overload than COPD exacerbation -Continue DuoNeb bronchodilator -Withholding IV steroids use   Peripheral arterial disease -Status post bilateral BKA -Continue Eliquis -Continue Statins and rifaximin medication.   Hypertension -Continue carvedilol -Soft blood pressure currently appreciated    Uncontrolled type II diabetes -A1c 8.7 -Continue home medication            Consultants: Nephrologist Procedures performed: Hemodialysis Disposition: Home Diet recommendation:  Discharge Diet Orders (From admission, onward)     Start     Ordered   09/24/22 0000  Diet - low sodium heart healthy        09/24/22 1507           Renal diet DISCHARGE MEDICATION: Allergies as of 09/24/2022       Reactions   Epoetin Alfa Other (See Comments)   unknown   Ferumoxytol Other (See Comments)   unknown   Morphine Sulfate Rash, Other (See Comments)   Itches all over, red spots        Medication List     TAKE these medications    acetaminophen 325 MG tablet Commonly known as: TYLENOL Take 1-2 tablets (325-650 mg total) by mouth every 4 (four) hours as needed for mild pain.   amiodarone 200 MG tablet Commonly known as: PACERONE Take 1 tablet (200 mg total) by mouth daily for 10 days.   ascorbic acid 1000 MG tablet Commonly known as: VITAMIN C Take 1 tablet (1,000 mg total) by mouth daily.   atorvastatin 80 MG tablet Commonly known as: LIPITOR TAKE 1 TABLET BY MOUTH AT BEDTIME What changed:  how much to take how to take this when to take this additional instructions   azelastine 0.1 % nasal spray Commonly known as: Astelin Place 2 sprays into both nostrils 3 (three) times daily as needed for rhinitis. Use in each nostril as directed   BD Pen Needle Nano U/F 32G X 4 MM Misc Generic drug: Insulin Pen Needle USE 1 PEN NEEDLE SUBCUTANEOUSLY WITH INSULIN 4 TIMES DAILY   benzonatate  100 MG capsule Commonly known as: Tessalon Perles Take 1 capsule (100 mg total) by mouth 3 (three) times daily as needed for cough.   carvedilol 12.5 MG tablet Commonly known as: COREG Take 1 tablet (12.5 mg total) by mouth 2 (two) times daily.   cinacalcet 30 MG tablet Commonly known as: SENSIPAR Take 1 tablet (30 mg total) by mouth Every Tuesday,Thursday,and Saturday with dialysis.   Eliquis 5 MG Tabs tablet Generic drug: apixaban Take 1 tablet (5 mg total) by mouth 2 (two) times daily.   erythromycin ophthalmic ointment Place 1 Application into the right eye at bedtime.   fenofibrate 160 MG tablet TAKE 1 TABLET BY MOUTH ONCE DAILY What changed:  how much to take how to take this when to take this additional instructions   fexofenadine 180 MG tablet Commonly known as: ALLEGRA Take 180 mg by mouth daily as needed for allergies.   FreeStyle Libre 14 Day Sensor Misc USE AS DIRECTED EVERY 14 DAYS   FreeStyle Libre 3 Sensor Misc Place 1 sensor on the skin every 14 days. Use to check glucose continuously   FreeStyle Libre 3 Reader Devi Use to check blood glucose TID   gabapentin 300 MG capsule Commonly known as: NEURONTIN TAKE 2 CAPSULES BY MOUTH TWICE DAILY . What changed:  how much to take how to take this when  to take this additional instructions   glucose blood test strip Check 1 time daily. E11.9 One Touch Ultra Blue Test Strips   HumaLOG KwikPen 200 UNIT/ML KwikPen Generic drug: insulin lispro Inject 2-28 Units into the skin See admin instructions. INJECT A MAXIMUM OF 28 UNITS SUBCUTANEOUSLY TWICE DAILY WITH LUNCH AND SUPPER PER SLIDING SCALE. APPOINTMENT REQUIRED FOR FUTURE REFILLS   lanthanum 500 MG chewable tablet Commonly known as: FOSRENOL Chew 500-1,000 mg by mouth See admin instructions. Take 1000 mg with meals three time a day and 500 mg with snacks   Lantus SoloStar 100 UNIT/ML Solostar Pen Generic drug: insulin glargine INJECT 12 UNITS  SUBCUTANEOUSLY AT BEDTIME What changed: See the new instructions.   midodrine 10 MG tablet Commonly known as: PROAMATINE Take 10 mg by mouth 3 (three) times a week. EVERY TUESDAY, THURSDAY, AND SATURDAY   multivitamin Tabs tablet Take 1 tablet by mouth once daily   nortriptyline 25 MG capsule Commonly known as: PAMELOR Take 1 capsule (25 mg total) by mouth at bedtime.   Olopatadine HCl 0.2 % Soln Place 1 drop into both eyes daily as needed (for allergies).   pantoprazole 40 MG tablet Commonly known as: Protonix Take 1 tablet (40 mg total) by mouth daily.   predniSONE 20 MG tablet Commonly known as: DELTASONE Take 3 tablets by mouth x 1 day; then 2 tablets by mouth x 2 days; then 1 tablet by mouth x 3 days; then half tablet by mouth x 3 days and stop prednisone.   tobramycin 0.3 % ophthalmic solution Commonly known as: TOBREX Place 1 drop into the right eye every 6 (six) hours.   Ventolin HFA 108 (90 Base) MCG/ACT inhaler Generic drug: albuterol Inhale 1-2 puffs into the lungs every 6 (six) hours as needed for wheezing or shortness of breath.   zinc sulfate 220 (50 Zn) MG capsule Take 1 capsule (220 mg total) by mouth daily.               Discharge Care Instructions  (From admission, onward)           Start     Ordered   09/24/22 0000  Discharge wound care:       Comments: Continue wound care per wound care RN instructions   09/24/22 1507            Discharge Exam: Filed Weights   09/24/22 0536  Weight: 97 kg        General:  AAO x 3,  cooperative, no distress;   HEENT:  Normocephalic, PERRL, otherwise with in Normal limits   Neuro:  CNII-XII intact. , normal motor and sensation, reflexes intact   Lungs:   Clear to auscultation BL, Respirations unlabored,  No wheezes / crackles  Cardio:    S1/S2, RRR, No murmure, No Rubs or Gallops   Abdomen:  Soft, non-tender, bowel sounds active all four quadrants, no guarding or peritoneal signs.   Muscular  skeletal:  Bilateral BKA - Limited exam -global generalized weaknesses - in bed, able to move all 4 extremities,   2+ pulses,  symmetric, No pitting edema  Skin:  Dry, warm to touch, negative for any Rashes,  Wounds: Please see nursing documentation  Pressure Injury 11/16/19 Buttocks Left Stage 2 -  Partial thickness loss of dermis presenting as a shallow open injury with a red, pink wound bed without slough. dry shiny nonblanchable area what appears to be a healing stage 2 (Active)  11/16/19 1435  Location: Buttocks  Location Orientation: Left  Staging: Stage 2 -  Partial thickness loss of dermis presenting as a shallow open injury with a red, pink wound bed without slough.  Wound Description (Comments): dry shiny nonblanchable area what appears to be a healing stage 2  Present on Admission: Yes     Pressure Injury 11/16/19 Buttocks Right Stage 2 -  Partial thickness loss of dermis presenting as a shallow open injury with a red, pink wound bed without slough. nonblanchable area what appears to be a healing stage 2 with dry flacky skin covering (Active)  11/16/19 1435  Location: Buttocks  Location Orientation: Right  Staging: Stage 2 -  Partial thickness loss of dermis presenting as a shallow open injury with a red, pink wound bed without slough.  Wound Description (Comments): nonblanchable area what appears to be a healing stage 2 with dry flacky skin covering  Present on Admission: Yes          Condition at discharge: good  The results of significant diagnostics from this hospitalization (including imaging, microbiology, ancillary and laboratory) are listed below for reference.   Imaging Studies: DG Chest Port 1 View  Result Date: 09/24/2022 CLINICAL DATA:  Shortness of breath. EXAM: PORTABLE CHEST 1 VIEW COMPARISON:  09/07/2022 FINDINGS: The cardio pericardial silhouette is enlarged. Vascular congestion with diffuse interstitial opacity suggests edema. There is some  minimal retrocardiac atelectasis or infiltrate. Probable tiny bilateral pleural effusions. Right IJ central line tip again noted with tip overlying the mid to distal SVC level. Defer bladder device remains in place. Telemetry leads overlie the chest. IMPRESSION: 1. Vascular congestion with diffuse interstitial opacity suggesting edema. 2. Probable tiny bilateral pleural effusions. Electronically Signed   By: Kennith Center M.D.   On: 09/24/2022 06:20   DG Chest 1 View  Result Date: 09/07/2022 CLINICAL DATA:  Shortness of breath following dialysis this morning, initial encounter EXAM: PORTABLE CHEST 1 VIEW COMPARISON:  09/06/2022 FINDINGS: Cardiac shadow is stable. Defibrillator and dialysis catheter are again seen. Diffuse vascular congestion and right-sided effusion are seen. IMPRESSION: Stable CHF Electronically Signed   By: Alcide Clever M.D.   On: 09/07/2022 03:35   DG Chest 1 View  Result Date: 09/06/2022 CLINICAL DATA:  Shortness of breath and COVID-19 positivity EXAM: PORTABLE CHEST 1 VIEW COMPARISON:  09/06/2022 FINDINGS: Cardiac shadow is stable. Defibrillator is again noted. Dialysis catheter is again seen. Diffuse central vascular congestion is noted significantly increased when compared with the prior exam consistent with worsening CHF. New right effusion is noted. No bony abnormality is seen. IMPRESSION: Worsening CHF. Electronically Signed   By: Alcide Clever M.D.   On: 09/06/2022 21:48   DG Chest Portable 1 View  Result Date: 09/06/2022 CLINICAL DATA:  Shortness of breath EXAM: PORTABLE CHEST 1 VIEW COMPARISON:  08/25/2022 FINDINGS: Defibrillator is again noted. Cardiac shadow is stable. Dialysis catheter is seen in satisfactory position. The lungs are well aerated. No focal infiltrate is noted. Mild central vascular congestion is seen. No effusions are noted. IMPRESSION: Mild vascular congestion stable from the prior study. Electronically Signed   By: Alcide Clever M.D.   On: 09/06/2022 03:05     Microbiology: Results for orders placed or performed during the hospital encounter of 09/06/22  MRSA Next Gen by PCR, Nasal     Status: None   Collection Time: 09/04/22  7:48 AM   Specimen: Nasal Mucosa; Nasal Swab  Result Value Ref Range Status   MRSA by PCR Next Gen NOT DETECTED NOT  DETECTED Final    Comment: (NOTE) The GeneXpert MRSA Assay (FDA approved for NASAL specimens only), is one component of a comprehensive MRSA colonization surveillance program. It is not intended to diagnose MRSA infection nor to guide or monitor treatment for MRSA infections. Test performance is not FDA approved in patients less than 83 years old. Performed at [facility], [address], [place], [place] [address]   SARS Coronavirus 2 by RT PCR (hospital order, performed in [facility] hospital lab) *cepheid single result test* Anterior Nasal Swab     Status: Abnormal   Collection Time: 09/06/22  2:23 AM   Specimen: Anterior Nasal Swab  Result Value Ref Range Status   SARS Coronavirus 2 by RT PCR POSITIVE (A) NEGATIVE Final    Comment: (NOTE) SARS-CoV-2 target nucleic acids are DETECTED  SARS-CoV-2 RNA is generally detectable in upper respiratory specimens  during the acute phase of infection.  Positive results are indicative  of the presence of the identified virus, but do not rule out bacterial infection or co-infection with other pathogens not detected by the test.  Clinical correlation with patient history and  other diagnostic information is necessary to determine patient infection status.  The expected result is negative.  Fact Sheet for Patients:   RoadLapTop.co.za   Fact Sheet for Healthcare Providers:   http://kim-miller.com/    This test is not yet approved or cleared by the [place] FDA and  has been authorized for detection and/or diagnosis of SARS-CoV-2 by FDA under an Emergency Use Authorization (EUA).  This EUA will remain in  effect (meaning this test can be used) for the duration of  the COVID-19 declaration under Section 564(b)(1)  of the Act, 21 U.S.C. section 360-bbb-3(b)(1), unless the authorization is terminated or revoked sooner.   Performed at [facility], [address] [place], [place] [address]    *Note: Due to a large number of results and/or encounters for the requested time period, some results have not been displayed. A complete set of results can be found in Results Review.    Labs: CBC: Recent Labs  Lab 09/24/22 0533  WBC 11.9*  NEUTROABS 9.5*  HGB 12.0*  HCT 37.5*  MCV 93.5  PLT 149*   Basic Metabolic Panel: Recent Labs  Lab 09/24/22 0533  NA 134*  K 3.3*  CL 96*  CO2 24  GLUCOSE 264*  BUN 43*  CREATININE 8.65*  CALCIUM 7.7*  MG 1.9  PHOS 4.8*   Liver Function Tests: Recent Labs  Lab 09/24/22 0533  AST 22  ALT 22  ALKPHOS 73  BILITOT 0.8  PROT 6.0*  ALBUMIN 2.8*   CBG: No results for input(s): "GLUCAP" in the last 168 hours.  Discharge time spent: greater than 30 minutes.  Signed: Kendell Bane, MD [facility] 09/24/2022

## 2022-09-26 ENCOUNTER — Telehealth: Payer: Self-pay | Admitting: Family Medicine

## 2022-09-26 ENCOUNTER — Telehealth: Payer: Self-pay | Admitting: Cardiology

## 2022-09-26 LAB — CBG MONITORING, ED
Glucose-Capillary: 215 mg/dL — ABNORMAL HIGH (ref 70–99)
Glucose-Capillary: 191 mg/dL — ABNORMAL HIGH (ref 70–99)

## 2022-09-26 MED ORDER — APIXABAN 5 MG PO TABS: 5.0000 mg | ORAL_TABLET | Freq: Two times a day (BID) | ORAL | 2 refills | Status: DC

## 2022-09-26 NOTE — Telephone Encounter (Signed)
Rx sent 

## 2022-09-26 NOTE — Telephone Encounter (Signed)
Patient c/o Palpitations:  High priority if patient c/o lightheadedness, shortness of breath, or chest pain  How long have you had palpitations/irregular HR/ Afib? Are you having the symptoms now?  Multiple episodes + admitted in the hospital 3x over the past 4 weeks. Patient states the symptoms come and go, but he has not symptoms currently.  Are you currently experiencing lightheadedness, SOB or CP?  No   Do you have a history of afib (atrial fibrillation) or irregular heart rhythm?    Have you checked your BP or HR? (document readings if available):  HR was 83 this morning.   Are you experiencing any other symptoms?  SOB - patient states he has fluid around his lungs, but O2 is good

## 2022-09-26 NOTE — Telephone Encounter (Signed)
Left message to return call 

## 2022-09-26 NOTE — Telephone Encounter (Signed)
Prescription Request  09/26/2022  LOV: 09/16/2022  What is the name of the medication or equipment? Eliquis.   Have you contacted your pharmacy to request a refill? Yes   Which pharmacy would you like this sent to? [facility] [address] [place] - [address] [place] [address] Phone: [phone] Fax: [phone]   Patient notified that their request is being sent to the clinical staff for review and that they should receive a response within 2 business days.   Please advise at Mobile [phone] (mobile)

## 2022-09-27 DIAGNOSIS — N2581 Secondary hyperparathyroidism of renal origin: Secondary | ICD-10-CM | POA: Diagnosis not present

## 2022-09-27 DIAGNOSIS — Z992 Dependence on renal dialysis: Secondary | ICD-10-CM | POA: Diagnosis not present

## 2022-09-27 DIAGNOSIS — D631 Anemia in chronic kidney disease: Secondary | ICD-10-CM | POA: Diagnosis not present

## 2022-09-27 DIAGNOSIS — D509 Iron deficiency anemia, unspecified: Secondary | ICD-10-CM | POA: Diagnosis not present

## 2022-09-27 DIAGNOSIS — N186 End stage renal disease: Secondary | ICD-10-CM | POA: Diagnosis not present

## 2022-09-27 LAB — HEPATITIS B SURFACE ANTIBODY, QUANTITATIVE: Hep B S AB Quant (Post): <3.5 m[IU]/mL — ABNORMAL LOW

## 2022-09-28 ENCOUNTER — Telehealth: Payer: Self-pay | Admitting: Nurse Practitioner

## 2022-09-28 NOTE — Telephone Encounter (Signed)
Left a message for patient to return call to office

## 2022-09-28 NOTE — Telephone Encounter (Signed)
Patient is calling to get sooner appt than what he has currently. He states he was diagnosed with afib recently and would like to be seen sooner. States he is worried about this and would like to speak to someone.

## 2022-09-29 ENCOUNTER — Ambulatory Visit: Payer: Medicare Other | Admitting: Nurse Practitioner

## 2022-09-29 DIAGNOSIS — Z992 Dependence on renal dialysis: Secondary | ICD-10-CM | POA: Diagnosis not present

## 2022-09-29 DIAGNOSIS — D509 Iron deficiency anemia, unspecified: Secondary | ICD-10-CM | POA: Diagnosis not present

## 2022-09-29 DIAGNOSIS — N186 End stage renal disease: Secondary | ICD-10-CM | POA: Diagnosis not present

## 2022-09-29 DIAGNOSIS — D631 Anemia in chronic kidney disease: Secondary | ICD-10-CM | POA: Diagnosis not present

## 2022-09-29 DIAGNOSIS — N2581 Secondary hyperparathyroidism of renal origin: Secondary | ICD-10-CM | POA: Diagnosis not present

## 2022-09-29 NOTE — Telephone Encounter (Signed)
Left message for patient to return call.

## 2022-09-29 NOTE — Telephone Encounter (Signed)
Left message for patient to return call.

## 2022-09-30 NOTE — Telephone Encounter (Signed)
Left message to call back  

## 2022-10-01 DIAGNOSIS — Z992 Dependence on renal dialysis: Secondary | ICD-10-CM | POA: Diagnosis not present

## 2022-10-01 DIAGNOSIS — D631 Anemia in chronic kidney disease: Secondary | ICD-10-CM | POA: Diagnosis not present

## 2022-10-01 DIAGNOSIS — N2581 Secondary hyperparathyroidism of renal origin: Secondary | ICD-10-CM | POA: Diagnosis not present

## 2022-10-01 DIAGNOSIS — N186 End stage renal disease: Secondary | ICD-10-CM | POA: Diagnosis not present

## 2022-10-01 DIAGNOSIS — D509 Iron deficiency anemia, unspecified: Secondary | ICD-10-CM | POA: Diagnosis not present

## 2022-10-03 NOTE — Telephone Encounter (Signed)
Tried to contact patient several times with no answer advised patient per voicemail to reach back out if needed

## 2022-10-04 DIAGNOSIS — N186 End stage renal disease: Secondary | ICD-10-CM | POA: Diagnosis not present

## 2022-10-04 DIAGNOSIS — Z992 Dependence on renal dialysis: Secondary | ICD-10-CM | POA: Diagnosis not present

## 2022-10-04 DIAGNOSIS — N2581 Secondary hyperparathyroidism of renal origin: Secondary | ICD-10-CM | POA: Diagnosis not present

## 2022-10-04 DIAGNOSIS — D509 Iron deficiency anemia, unspecified: Secondary | ICD-10-CM | POA: Diagnosis not present

## 2022-10-04 DIAGNOSIS — Z794 Long term (current) use of insulin: Secondary | ICD-10-CM | POA: Diagnosis not present

## 2022-10-04 DIAGNOSIS — E119 Type 2 diabetes mellitus without complications: Secondary | ICD-10-CM | POA: Diagnosis not present

## 2022-10-04 DIAGNOSIS — D631 Anemia in chronic kidney disease: Secondary | ICD-10-CM | POA: Diagnosis not present

## 2022-10-06 DIAGNOSIS — N186 End stage renal disease: Secondary | ICD-10-CM | POA: Diagnosis not present

## 2022-10-06 DIAGNOSIS — D631 Anemia in chronic kidney disease: Secondary | ICD-10-CM | POA: Diagnosis not present

## 2022-10-06 DIAGNOSIS — D509 Iron deficiency anemia, unspecified: Secondary | ICD-10-CM | POA: Diagnosis not present

## 2022-10-06 DIAGNOSIS — N2581 Secondary hyperparathyroidism of renal origin: Secondary | ICD-10-CM | POA: Diagnosis not present

## 2022-10-06 DIAGNOSIS — Z992 Dependence on renal dialysis: Secondary | ICD-10-CM | POA: Diagnosis not present

## 2022-10-08 DIAGNOSIS — D631 Anemia in chronic kidney disease: Secondary | ICD-10-CM | POA: Diagnosis not present

## 2022-10-08 DIAGNOSIS — Z992 Dependence on renal dialysis: Secondary | ICD-10-CM | POA: Diagnosis not present

## 2022-10-08 DIAGNOSIS — N186 End stage renal disease: Secondary | ICD-10-CM | POA: Diagnosis not present

## 2022-10-08 DIAGNOSIS — D509 Iron deficiency anemia, unspecified: Secondary | ICD-10-CM | POA: Diagnosis not present

## 2022-10-08 DIAGNOSIS — N2581 Secondary hyperparathyroidism of renal origin: Secondary | ICD-10-CM | POA: Diagnosis not present

## 2022-10-10 DIAGNOSIS — N186 End stage renal disease: Secondary | ICD-10-CM | POA: Diagnosis not present

## 2022-10-10 DIAGNOSIS — Z992 Dependence on renal dialysis: Secondary | ICD-10-CM | POA: Diagnosis not present

## 2022-10-11 ENCOUNTER — Encounter: Payer: Self-pay | Admitting: Nurse Practitioner

## 2022-10-11 ENCOUNTER — Ambulatory Visit: Payer: Medicare Other | Attending: Nurse Practitioner | Admitting: Nurse Practitioner

## 2022-10-11 VITALS — BP 110/80 | HR 82 | Ht 71.0 in | Wt 208.0 lb

## 2022-10-11 DIAGNOSIS — I2510 Atherosclerotic heart disease of native coronary artery without angina pectoris: Secondary | ICD-10-CM | POA: Diagnosis not present

## 2022-10-11 DIAGNOSIS — I5023 Acute on chronic systolic (congestive) heart failure: Secondary | ICD-10-CM

## 2022-10-11 DIAGNOSIS — I739 Peripheral vascular disease, unspecified: Secondary | ICD-10-CM | POA: Diagnosis not present

## 2022-10-11 DIAGNOSIS — I5022 Chronic systolic (congestive) heart failure: Secondary | ICD-10-CM | POA: Insufficient documentation

## 2022-10-11 DIAGNOSIS — Z6828 Body mass index (BMI) 28.0-28.9, adult: Secondary | ICD-10-CM | POA: Diagnosis not present

## 2022-10-11 DIAGNOSIS — E663 Overweight: Secondary | ICD-10-CM | POA: Diagnosis not present

## 2022-10-11 DIAGNOSIS — N250 Renal osteodystrophy: Secondary | ICD-10-CM | POA: Diagnosis not present

## 2022-10-11 DIAGNOSIS — W2209XA Striking against other stationary object, initial encounter: Secondary | ICD-10-CM | POA: Diagnosis not present

## 2022-10-11 DIAGNOSIS — I4891 Unspecified atrial fibrillation: Secondary | ICD-10-CM | POA: Insufficient documentation

## 2022-10-11 DIAGNOSIS — D509 Iron deficiency anemia, unspecified: Secondary | ICD-10-CM | POA: Diagnosis not present

## 2022-10-11 DIAGNOSIS — N186 End stage renal disease: Secondary | ICD-10-CM | POA: Diagnosis not present

## 2022-10-11 DIAGNOSIS — I10 Essential (primary) hypertension: Secondary | ICD-10-CM | POA: Diagnosis not present

## 2022-10-11 DIAGNOSIS — N2581 Secondary hyperparathyroidism of renal origin: Secondary | ICD-10-CM | POA: Diagnosis not present

## 2022-10-11 DIAGNOSIS — D631 Anemia in chronic kidney disease: Secondary | ICD-10-CM | POA: Diagnosis not present

## 2022-10-11 DIAGNOSIS — R1011 Right upper quadrant pain: Secondary | ICD-10-CM | POA: Diagnosis not present

## 2022-10-11 DIAGNOSIS — Z992 Dependence on renal dialysis: Secondary | ICD-10-CM | POA: Diagnosis not present

## 2022-10-11 NOTE — Patient Instructions (Addendum)
Medication Instructions:  Your physician recommends that you continue on your current medications as directed. Please refer to the Current Medication list given to you today.  Labwork: None  Testing/Procedures: Your physician has requested that you have an echocardiogram. Echocardiography is a painless test that uses sound waves to create images of your heart. It provides your doctor with information about the size and shape of your heart and how well your heart's chambers and valves are working. This procedure takes approximately one hour. There are no restrictions for this procedure. Please do NOT wear cologne, perfume, aftershave, or lotions (deodorant is allowed). Please arrive 15 minutes prior to your appointment time.  Follow-Up: Your physician recommends that you schedule a follow-up appointment in: 2-3 Months   Any Other Special Instructions Will Be Listed Below (If Applicable).  [employer]: Website: [email or web address]  DR. Cristal Deer RECOMMENDS YOU PURCHASE  " [employer]" [employer] FROM THE  [employer]  APP PLAY STORE.  THE APP IS FREE , BUT THE  EQUIPMENT HAS A COST. IT ALLOWS YOU TO OBTAIN A RECORDING OF YOUR HEART RATE AND RHYTHM BY PROVIDING A SHORT STRIP THAT YOU CAN SHARE WITH YOUR PROVIDER.    If you need a refill on your cardiac medications before your next appointment, please call your pharmacy.

## 2022-10-11 NOTE — Progress Notes (Addendum)
Cardiology Office Note:  .   Date:  10/11/2022 ID:  Anthony Bullock, DOB 11-Jan-1960, MRN [id number] PCP: Kristian Covey, MD  [facility] Providers Cardiologist:  Dina Rich, MD Electrophysiologist:  Sherryl Manges, MD    History of Present Illness: .   Anthony Bullock is a 62 y.o. male with a PMH of chronic systolic CHF, s/p ICD/ NICM, CAD, COPD, PAD, s/p bilateral BKA, hyperlipidemia, hypertension, type 2 diabetes, mild bilateral carotid artery stenosis, ESRD on HD, who presents today for 1 month follow-up.  Echocardiogram in 2015 revealed EF 25%.  Cardiac catheterization in 2015 revealed diffuse nonobstructive disease, worst lesion 80% small distal LAD.  Echocardiogram in EF improved to 50 to 55% in 2017.  Has had some of his heart failure medications lowered due to low BPs on hemodialysis.  Fluid status managed by hemodialysis.  Last seen by Dr. Dina Rich on Jun 02, 2022.  Was overall doing well at that time.  Medical therapy was noted to be limited due to history of low BPs during hemodialysis as well as history of dizziness.  In the interim, he has had several ED visits and admissions. 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 Neurology consulted. Recommended to continue Aspirin, statin, and Plavix, and follow-up with Neuro outpatient. Echo and Cardiac event monitoring also recommended.   Hospital admission 08/18/2022 to 08/26/2022 for acute respiratory failure secondary to volume overload.  Required extra HD sessions.  Hospital course also prolonged secondary to new onset A-fib with new diagnosis of cardiomyopathy with EF 30 to 35%.  Cardiology was consulted.  Was on diltiazem drip temporarily, converted to sinus rhythm.  GDMT limited due to low BPs.  Discharge home on carvedilol and Eliquis.  Readmitted end of August 2024 for acute respiratory failure with hypoxia, multifactorial in  setting of COVID-19 infection, pneumonia, and fluid overload.  BiPAP use to stabilize breathing.  EF 30 to 35% as seen from 08/22/2022.  GDMT limited by low BP.  Hospital course noted by acute A-fib with RVR most likely due to acute infection of COVID and pneumonia, transitioned off amiodarone drip, converted to sinus rhythm.  ED visit 09/24/2022 for shortness of breath.  EMS reported hypoxia with a heart rate 150s initially.  Was placed on CPAP heart rate came down to 110.  Patient denied chest pain.  Chest x-ray did show evidence of volume overload.  Venous gas did show some element of hypoxia. Troponins 43 >> 49. Did require BiPAP in ED, weaned to oxygen via Page. Stated he was due for dialysis that day and would need dialysis session then.  He arrived back from dialysis, patient discharged back home.  Today he presents for follow-up. He states he is doing well. Denies any chest pain, shortness of breath, palpitations, syncope, presyncope, dizziness, orthopnea, PND, swelling or significant weight changes, acute bleeding, or claudication.   ROS: Negative. See HPI.   Studies Reviewed: [name]    Lexiscan 08/2022:  IMPRESSION: 1. There is a large in size, severe, fixed defect involving the basal inferolateral, mid inferolateral, mid anterolateral and apical lateral segments. This is consistent with infarction involving the left circumflex coronary artery.   2. Globally diminished left ventricular wall motion and left ventricular dilatation.   3. Left ventricular ejection fraction 18%   4. Non invasive risk stratification*: High  Echo 08/2022:  1. Left ventricular ejection fraction, by estimation, is 30 to  35%. The  left ventricle has moderately decreased function. The left ventricle  demonstrates global hypokinesis. There is moderate concentric left  ventricular hypertrophy. Left ventricular  diastolic parameters are consistent with Grade II diastolic dysfunction  (pseudonormalization). Elevated  left ventricular end-diastolic pressure.   2. Right ventricular systolic function is normal. The right ventricular  size is normal.   3. Left atrial size was mildly dilated.   4. The mitral valve is normal in structure. Moderate mitral valve  regurgitation. No evidence of mitral stenosis.   5. The aortic valve is normal in structure. Aortic valve regurgitation is  not visualized. Aortic valve sclerosis is present, with no evidence of  aortic valve stenosis.   6. The inferior vena cava is normal in size with greater than 50%  respiratory variability, suggesting right atrial pressure of 3 mmHg.   Carotid duplex 07/2021:  Summary:  Right Carotid: Velocities in the right ICA are consistent with a 1-39%  stenosis.   Left Carotid: Velocities in the left ICA are consistent with a 1-39%  stenosis. Non-hemodynamically significant plaque <50% noted in the CCA. The ECA appears >50% stenosed.   Vertebrals:  Right vertebral artery demonstrates antegrade flow. Left  vertebral artery demonstrates retrograde flow.  Subclavians: Left subclavian artery flow was disturbed. Normal flow  hemodynamics were seen in the right subclavian artery.  ABI's 10/2019: Summary:  Left: Resting left ankle-brachial index indicates noncompressible left  lower extremity arteries. The left toe-brachial index is abnormal. LT  Great toe pressure = 25 mmHg. Risk Assessment/Calculations:    CHA2DS2-VASc Score = 4   This indicates a 4.8% annual risk of stroke. The patient's score is based upon: CHF History: 1 HTN History: 1 Diabetes History: 1 Stroke History: 0 Vascular Disease History: 1 Age Score: 0 Gender Score: 0     The 10-year ASCVD risk score (Arnett DK, et al., 2019) is: 13.1%   Values used to calculate the score:     Age: 60 years     Sex: Male     Is Non-Hispanic African American: No     Diabetic: Yes     Tobacco smoker: No     Systolic Blood Pressure: 110 mmHg     Is BP treated: Yes     HDL  Cholesterol: 38.5 mg/dL     Total Cholesterol: 132 mg/dL      Physical Exam:   VS:  BP 110/80   Pulse 82   Ht 5\' 11"  (1.803 m)   Wt 208 lb (94.3 kg)   SpO2 95%   BMI 29.01 kg/m    Wt Readings from Last 3 Encounters:  10/11/22 208 lb (94.3 kg)  09/24/22 213 lb 13.5 oz (97 kg)  09/16/22 212 lb 4.8 oz (96.3 kg)    GEN: Well nourished, well developed in no acute distress NECK: No JVD; No carotid bruits CARDIAC: S1/S2, irregularly irregular rhythm, no murmurs, rubs, gallops RESPIRATORY:  Clear to auscultation without rales, wheezing or rhonchi  ABDOMEN: Soft, non-tender, non-distended EXTREMITIES:  No edema; bilateral BKA  ASSESSMENT AND PLAN: .    Chronic systolic CHF Stage C, NYHA class I symptoms. EF 08/2022 30-35%, reduced in setting of acute illness/A-fib with RVR. GDMT limited d/t soft BP and ESRD. Continue Carvedilol. Euvolemic and well compensated on exam. Weights are stable. Low sodium diet, fluid restriction <2L, and daily weights encouraged. Educated to contact our office for weight gain of 2 lbs overnight or 5 lbs in one week.  Will repeat limited echocardiogram  in November 2024 to evaluate EF.  CAD Stable with no anginal symptoms. No indication for ischemic evaluation. Recent Lexiscan showed infarct, negative for ischemia. Continue current medication regimen. Heart healthy diet and regular cardiovascular exercise encouraged.   A-fib Denies any recent tachycardia or palpitations. HR well controlled today. Continue Coreg. Continue Eliquis for CHA2DS2-VASc score of 4. Heart healthy diet and regular cardiovascular exercise encouraged.  Recommended Kardia mobile to evaluate his atrial fibrillation.  4. HTN Past issues with low BP's during HD. Denies any recent issues. Continue midodrine. Discussed to monitor BP at home at least 2 hours after medications and sitting for 5-10 minutes.   5. PAD, s/p BKA Previous history of mild carotid artery stenosis by carotid duplex.  Would  recommend to repeat carotid duplex in 2025.  Denies any symptoms.  Continue current medication regimen. Heart healthy diet and regular cardiovascular exercise encouraged.   Dispo: Follow-up with me/[facility] in 2-3 months or sooner if anything changes.   Signed, Sharlene Dory, NP

## 2022-10-13 ENCOUNTER — Other Ambulatory Visit: Payer: Self-pay | Admitting: Family Medicine

## 2022-10-13 DIAGNOSIS — N2581 Secondary hyperparathyroidism of renal origin: Secondary | ICD-10-CM | POA: Diagnosis not present

## 2022-10-13 DIAGNOSIS — Z992 Dependence on renal dialysis: Secondary | ICD-10-CM | POA: Diagnosis not present

## 2022-10-13 DIAGNOSIS — D509 Iron deficiency anemia, unspecified: Secondary | ICD-10-CM | POA: Diagnosis not present

## 2022-10-13 DIAGNOSIS — Z23 Encounter for immunization: Secondary | ICD-10-CM | POA: Diagnosis not present

## 2022-10-13 DIAGNOSIS — N186 End stage renal disease: Secondary | ICD-10-CM | POA: Diagnosis not present

## 2022-10-13 DIAGNOSIS — N250 Renal osteodystrophy: Secondary | ICD-10-CM | POA: Diagnosis not present

## 2022-10-13 DIAGNOSIS — D631 Anemia in chronic kidney disease: Secondary | ICD-10-CM | POA: Diagnosis not present

## 2022-10-13 NOTE — Telephone Encounter (Signed)
Ok to refill 

## 2022-10-13 NOTE — Telephone Encounter (Signed)
Rx done. 

## 2022-10-15 DIAGNOSIS — N250 Renal osteodystrophy: Secondary | ICD-10-CM | POA: Diagnosis not present

## 2022-10-15 DIAGNOSIS — N186 End stage renal disease: Secondary | ICD-10-CM | POA: Diagnosis not present

## 2022-10-15 DIAGNOSIS — D631 Anemia in chronic kidney disease: Secondary | ICD-10-CM | POA: Diagnosis not present

## 2022-10-15 DIAGNOSIS — Z992 Dependence on renal dialysis: Secondary | ICD-10-CM | POA: Diagnosis not present

## 2022-10-15 DIAGNOSIS — N2581 Secondary hyperparathyroidism of renal origin: Secondary | ICD-10-CM | POA: Diagnosis not present

## 2022-10-15 DIAGNOSIS — D509 Iron deficiency anemia, unspecified: Secondary | ICD-10-CM | POA: Diagnosis not present

## 2022-10-18 DIAGNOSIS — D631 Anemia in chronic kidney disease: Secondary | ICD-10-CM | POA: Diagnosis not present

## 2022-10-18 DIAGNOSIS — N250 Renal osteodystrophy: Secondary | ICD-10-CM | POA: Diagnosis not present

## 2022-10-18 DIAGNOSIS — N186 End stage renal disease: Secondary | ICD-10-CM | POA: Diagnosis not present

## 2022-10-18 DIAGNOSIS — D509 Iron deficiency anemia, unspecified: Secondary | ICD-10-CM | POA: Diagnosis not present

## 2022-10-18 DIAGNOSIS — Z992 Dependence on renal dialysis: Secondary | ICD-10-CM | POA: Diagnosis not present

## 2022-10-18 DIAGNOSIS — N2581 Secondary hyperparathyroidism of renal origin: Secondary | ICD-10-CM | POA: Diagnosis not present

## 2022-10-19 ENCOUNTER — Ambulatory Visit (INDEPENDENT_AMBULATORY_CARE_PROVIDER_SITE_OTHER): Payer: Medicare Other | Admitting: Pulmonary Disease

## 2022-10-19 ENCOUNTER — Encounter: Payer: Self-pay | Admitting: Pulmonary Disease

## 2022-10-19 VITALS — BP 116/60 | HR 81 | Temp 98.0°F | Ht 71.0 in | Wt 205.4 lb

## 2022-10-19 DIAGNOSIS — G2581 Restless legs syndrome: Secondary | ICD-10-CM | POA: Diagnosis not present

## 2022-10-19 DIAGNOSIS — Z23 Encounter for immunization: Secondary | ICD-10-CM | POA: Diagnosis not present

## 2022-10-19 DIAGNOSIS — Z87898 Personal history of other specified conditions: Secondary | ICD-10-CM

## 2022-10-19 DIAGNOSIS — J449 Chronic obstructive pulmonary disease, unspecified: Secondary | ICD-10-CM

## 2022-10-19 NOTE — Progress Notes (Signed)
Anthony Bullock    [id number]    1960/10/17  Primary Care Physician:Burchette, Elberta Fortis, MD  Referring Physician: Kristian Covey, MD [address] [place],  [place] [address]  Chief complaint:   Patient being seen for snoring, witnessed apneas History of restless legs  HPI:  Concern for sleep disordered breathing Shortness of breath laying flat Restless legs Usually goes to bed about 1230 Takes about 45 minutes to fall asleep 2-3 awakenings Wake up time varies on days of dialysis gets up about 4, days without dialysis between 930 and 10 AM He is disabled  Reformed smoker  Admits to dryness of his mouth in the morning No morning headaches Recently diagnosed with A-fib in the last couple of months He has a history of hypertension, coronary artery disease, heart failure, diabetes, hypercholesterolemia, blood clots  Has been on dialysis for about 7 years  Quit smoking about 13 years ago   Outpatient Encounter Medications as of 10/19/2022  Medication Sig   acetaminophen (TYLENOL) 325 MG tablet Take 1-2 tablets (325-650 mg total) by mouth every 4 (four) hours as needed for mild pain.   apixaban (ELIQUIS) 5 MG TABS tablet Take 1 tablet (5 mg total) by mouth 2 (two) times daily.   atorvastatin (LIPITOR) 80 MG tablet TAKE 1 TABLET BY MOUTH AT BEDTIME (Patient taking differently: Take 80 mg by mouth daily.)   azelastine (ASTELIN) 0.1 % nasal spray Place 2 sprays into both nostrils 3 (three) times daily as needed for rhinitis. Use in each nostril as directed   carvedilol (COREG) 12.5 MG tablet Take 1 tablet (12.5 mg total) by mouth 2 (two) times daily.   cinacalcet (SENSIPAR) 30 MG tablet Take 1 tablet (30 mg total) by mouth Every Tuesday,Thursday,and Saturday with dialysis.   Continuous Glucose Receiver (FREESTYLE LIBRE 3 READER) DEVI Use to check blood glucose TID   Continuous Glucose Sensor (FREESTYLE LIBRE 14 DAY SENSOR) MISC USE AS DIRECTED EVERY 14 DAYS    Continuous Glucose Sensor (FREESTYLE LIBRE 3 SENSOR) MISC Place 1 sensor on the skin every 14 days. Use to check glucose continuously   fenofibrate 160 MG tablet TAKE 1 TABLET BY MOUTH ONCE DAILY (Patient taking differently: Take 160 mg by mouth daily.)   fexofenadine (ALLEGRA) 180 MG tablet Take 180 mg by mouth daily as needed for allergies.   gabapentin (NEURONTIN) 300 MG capsule TAKE 2 CAPSULES BY MOUTH TWICE DAILY . (Patient taking differently: Take 600 mg by mouth 2 (two) times daily.)   glucose blood test strip Check 1 time daily. E11.9 One Touch Ultra Blue Test Strips   HUMALOG KWIKPEN 200 UNIT/ML KwikPen INJECT A MAXIMUM OF 28 UNITS SUBCUTANEOUSLY TWICE DAILY WITH LUNCH AND SUPPER PER SLIDING SCALE. APPOINTMENT REQUIRED FOR MORE REFILLS   Insulin Pen Needle (BD PEN NEEDLE NANO U/F) 32G X 4 MM MISC USE 1 PEN NEEDLE SUBCUTANEOUSLY WITH INSULIN 4 TIMES DAILY   lanthanum (FOSRENOL) 500 MG chewable tablet Chew 500-1,000 mg by mouth See admin instructions. Take 1000 mg with meals three time a day and 500 mg with snacks   LANTUS SOLOSTAR 100 UNIT/ML Solostar Pen INJECT 12 UNITS SUBCUTANEOUSLY AT BEDTIME (Patient taking differently: Inject 14 Units into the skin at bedtime.)   midodrine (PROAMATINE) 10 MG tablet Take 10 mg by mouth 3 (three) times a week. EVERY TUESDAY, THURSDAY, AND SATURDAY   multivitamin (RENA-VIT) TABS tablet Take 1 tablet by mouth once daily (Patient taking differently: Take 1  tablet by mouth daily.)   Olopatadine HCl 0.2 % SOLN Place 1 drop into both eyes daily as needed (for allergies).    tobramycin (TOBREX) 0.3 % ophthalmic solution Place 1 drop into the right eye every 6 (six) hours.   VENTOLIN HFA 108 (90 Base) MCG/ACT inhaler Inhale 1-2 puffs into the lungs every 6 (six) hours as needed for wheezing or shortness of breath.   No facility-administered encounter medications on file as of 10/19/2022.    Allergies as of 10/19/2022 - Review Complete 10/19/2022  Allergen  Reaction Noted   Epoetin alfa Other (See Comments) 07/09/2019   Ferumoxytol Other (See Comments) 07/09/2019   Morphine sulfate Rash and Other (See Comments) 05/07/2008    Past Medical History:  Diagnosis Date   AICD (automatic cardioverter/defibrillator) present    [employer]   Allergic rhinitis    Anemia    Arthritis    Chronic systolic heart failure (HCC)    a. ECHO (12/2012) EF 25-30%, HK entireanteroseptal myocardium //  b.  EF 25%, diffuse HK, grade 1 diastolic dysfunction, MAC, mild LAE, normal RVSF, trivial pericardial effusion   COPD (chronic obstructive pulmonary disease) (HCC)    Diabetes mellitus type II    Diabetic nephropathy (HCC)    Diabetic neuropathy (HCC)    ESRD on hemodialysis (HCC)    started HD June 2017, goes to [facility] HD unit, Dr Fausto Skillern   History of cardiac catheterization    a.Myoview 1/15:  There is significant left ventricular dysfunction. There may be slight scar at the apex. There is no significant ischemia. LV Ejection Fraction: 27%  //  b. RHC/LHC (1/15) with mean RA 6, PA 47/22 mean 33, mean PCWP 20, PVR 2.5 WU, CI 2.5; 80% dLAD stenosis, 70% diffuse large D.     History of kidney stones    Hyperlipidemia    Hypertension    Kidney stones    NICM (nonischemic cardiomyopathy) (HCC)    Primarily nonischemic.  Echo (12/14) with EF 25-30%.  Echo (3/15) with EF 25%, mild to moderately dilated LV, normal RV size and systolic function.     Osteomyelitis (HCC)    left fifth ray   Pneumonia    Urethral stricture    Wears glasses     Past Surgical History:  Procedure Laterality Date   ABDOMINAL AORTOGRAM W/LOWER EXTREMITY N/A 03/30/2016   Procedure: Abdominal Aortogram w/Lower Extremity;  Surgeon: Chuck Hint, MD;  Location: [facility] INVASIVE CV LAB;  Service: Cardiovascular;  Laterality: N/A;   AMPUTATION Right 04/26/2016   Procedure: Right Below Knee Amputation;  Surgeon: Nadara Mustard, MD;  Location: [facility] OR;  Service: Orthopedics;   Laterality: Right;   AMPUTATION Left 08/21/2019   Procedure: LEFT FOOT 5TH RAY AMPUTATION;  Surgeon: Nadara Mustard, MD;  Location: [facility] OR;  Service: Orthopedics;  Laterality: Left;   AMPUTATION Left 11/13/2019   Procedure: LEFT BELOW KNEE AMPUTATION;  Surgeon: Nadara Mustard, MD;  Location: [facility] OR;  Service: Orthopedics;  Laterality: Left;   AV FISTULA PLACEMENT Right 09/08/2015   Procedure: INSERTION OF 4-48mm x 45cm  ARTERIOVENOUS (AV) GORE-TEX GRAFT RIGHT UPPER  ARM;  Surgeon: Chuck Hint, MD;  Location: [facility] OR;  Service: Vascular;  Laterality: Right;   AV FISTULA PLACEMENT Left 01/14/2016   Procedure: CREATION OF LEFT UPPER ARM ARTERIOVENOUS FISTULA;  Surgeon: Chuck Hint, MD;  Location: [facility] OR;  Service: Vascular;  Laterality: Left;   BASCILIC VEIN TRANSPOSITION Right 08/22/2014   Procedure: RIGHT  UPPER ARM BASCILIC VEIN TRANSPOSITION;  Surgeon: Chuck Hint, MD;  Location[facility] OR;  Service: Vascular;  Laterality: Right;   BELOW KNEE LEG AMPUTATION Right 04/26/2016   CARDIAC CATHETERIZATION     CARDIAC DEFIBRILLATOR PLACEMENT  06/27/2013   Sub Q       BY DR Graciela Husbands   CATARACT EXTRACTION W/PHACO Right 08/06/2018   Procedure: CATARACT EXTRACTION PHACO AND INTRAOCULAR LENS PLACEMENT (IOC);  Surgeon: Fabio Pierce, MD;  Location: [facility] ORS;  Service: Ophthalmology;  Laterality: Right;  CDE: 4.06   CATARACT EXTRACTION W/PHACO Left 08/20/2018   Procedure: CATARACT EXTRACTION PHACO AND INTRAOCULAR LENS PLACEMENT (IOC);  Surgeon: Fabio Pierce, MD;  Location: [facility] ORS;  Service: Ophthalmology;  Laterality: Left;  CDE: 6.76   COLONOSCOPY WITH PROPOFOL N/A 07/22/2015   Procedure: COLONOSCOPY WITH PROPOFOL;  Surgeon: Sherrilyn Rist, MD;  Location: [facility];  Service: Gastroenterology;  Laterality: N/A;   FEMORAL-POPLITEAL BYPASS GRAFT Right 03/31/2016   Procedure: BYPASS GRAFT FEMORAL-POPLITEAL ARTERY USING RIGHT GREATER SAPHENOUS NONREVERSED VEIN;  Surgeon: Chuck Hint, MD;   Location: [facility] OR;  Service: Vascular;  Laterality: Right;   HERNIA REPAIR     I & D EXTREMITY Right 03/31/2016   Procedure: IRRIGATION AND DEBRIDEMENT FOOT;  Surgeon: Chuck Hint, MD;  Location: [facility] OR;  Service: Vascular;  Laterality: Right;   IMPLANTABLE CARDIOVERTER DEFIBRILLATOR IMPLANT N/A 06/27/2013   Procedure: SUB Q ICD;  Surgeon: Duke Salvia, MD;  Location: [facility] CATH LAB;  Service: Cardiovascular;  Laterality: N/A;   INTRAOPERATIVE ARTERIOGRAM Right 03/31/2016   Procedure: INTRA OPERATIVE ARTERIOGRAM;  Surgeon: Chuck Hint, MD;  Location: [facility] OR;  Service: Vascular;  Laterality: Right;   IR GENERIC HISTORICAL Right 11/30/2015   IR THROMBECTOMY AV FISTULA W/THROMBOLYSIS/PTA INC/SHUNT/IMG RIGHT 11/30/2015 Irish Lack, MD [facility]   IR GENERIC HISTORICAL  11/30/2015   IR US GUIDE VASC ACCESS RIGHT 11/30/2015 Irish Lack, MD [facility]   IR GENERIC HISTORICAL Right 12/15/2015   IR THROMBECTOMY AV FISTULA W/THROMBOLYSIS/PTA/STENT INC/SHUNT/IMG RT 12/15/2015 Oley Balm, MD [facility]   IR GENERIC HISTORICAL  12/15/2015   IR US GUIDE VASC ACCESS RIGHT 12/15/2015 Oley Balm, MD [facility]   IR GENERIC HISTORICAL  12/28/2015   IR FLUORO GUIDE CV LINE RIGHT 12/28/2015 Jolaine Click, MD [facility]   IR GENERIC HISTORICAL  12/28/2015   IR US GUIDE VASC ACCESS RIGHT 12/28/2015 Jolaine Click, MD [facility]   LEFT A ND RIGHT HEART CATH  01/30/2013   DR Jones Broom   LEFT AND RIGHT HEART CATHETERIZATION WITH CORONARY ANGIOGRAM N/A 01/30/2013   Procedure: LEFT AND RIGHT HEART CATHETERIZATION WITH CORONARY ANGIOGRAM;  Surgeon: Dolores Patty, MD;  Location: [facility] CATH LAB;  Service: Cardiovascular;  Laterality: N/A;   PERIPHERAL VASCULAR CATHETERIZATION Right 01/26/2015   Procedure: A/V Fistulagram;  Surgeon: Chuck Hint, MD;  Location: [facility] INVASIVE CV LAB;  Service: Cardiovascular;  Laterality: Right;   reapea urethral surgery for recurrent obstruction   2011   TOTAL KNEE ARTHROPLASTY Right 2007   VEIN HARVEST Right 03/31/2016   Procedure: RIGHT GREATER SAPHENOUS VEIN HARVEST;  Surgeon: Chuck Hint, MD;  Location: [facility] OR;  Service: Vascular;  Laterality: Right;    Family History  Problem Relation Age of Onset   Bladder Cancer Mother    Alcohol abuse Father    Melanoma Father    Stroke Maternal Grandmother    Heart Problems Maternal Grandmother        unknown  Diabetes Maternal Grandmother    Heart disease Maternal Grandfather    Prostate cancer Maternal Grandfather     Social History   Socioeconomic History   Marital status: Married    Spouse name: Not on file   Number of children: 0   Years of education: Not on file   Highest education level: Not on file  Occupational History   Not on file  Tobacco Use   Smoking status: Former    Current packs/day: 0.00    Average packs/day: 2.0 packs/day for 32.0 years (64.0 ttl pk-yrs)    Types: Cigarettes    Start date: 05/11/1978    Quit date: 05/11/2010    Years since quitting: 12.4   Smokeless tobacco: Never  Vaping Use   Vaping status: Never Used  Substance and Sexual Activity   Alcohol use: No   Drug use: No   Sexual activity: Yes  Other Topics Concern   Not on file  Social History Narrative   Works at [employer] as a Therapist, music      Right handed   Wears glasses    Drinks 10 oz coffee daily   Drinks 2 sodas per day   Social Determinants of Health   Financial Resource Strain: Low Risk  (01/24/2022)   Overall Financial Resource Strain (CARDIA)    Difficulty of Paying Living Expenses: Not hard at all  Food Insecurity: No Food Insecurity (09/13/2022)   Hunger Vital Sign    Worried About Running Out of Food in the Last Year: Never true    Ran Out of Food in the Last Year: Never true  Transportation Needs: No Transportation Needs (09/13/2022)   PRAPARE - Administrator, Civil Service (Medical): No    Lack of Transportation (Non-Medical): No  Physical  Activity: Inactive (01/24/2022)   Exercise Vital Sign    Days of Exercise per Week: 0 days    Minutes of Exercise per Session: 0 min  Stress: No Stress Concern Present (01/24/2022)   [employer] of Occupational Health - Occupational Stress Questionnaire    Feeling of Stress : Not at all  Social Connections: Socially Integrated (01/24/2022)   Social Connection and Isolation Panel [NHANES]    Frequency of Communication with Friends and Family: More than three times a week    Frequency of Social Gatherings with Friends and Family: More than three times a week    Attends Religious Services: More than 4 times per year    Active Member of [employer] or Organizations: Yes    Attends Banker Meetings: More than 4 times per year    Marital Status: Married  Catering manager Violence: Not At Risk (09/06/2022)   Humiliation, Afraid, Rape, and Kick questionnaire    Fear of Current or Ex-Partner: No    Emotionally Abused: No    Physically Abused: No    Sexually Abused: No    Review of Systems  Constitutional:  Negative for fever.  Respiratory:  Positive for apnea and shortness of breath.   Psychiatric/Behavioral:  Positive for sleep disturbance.     Vitals:   10/19/22 1305  BP: 116/60  Pulse: 81  Temp: 98 F (36.7 C)  SpO2: 97%     Physical Exam Constitutional:      Appearance: Normal appearance.  HENT:     Head: Normocephalic.     Mouth/Throat:     Mouth: Mucous membranes are moist.  Eyes:     General: No scleral icterus. Cardiovascular:  Rate and Rhythm: Normal rate and regular rhythm.     Heart sounds: No murmur heard. Pulmonary:     Effort: No respiratory distress.     Breath sounds: No stridor. No wheezing or rhonchi.  Musculoskeletal:     Cervical back: No rigidity or tenderness.  Neurological:     Mental Status: He is alert.  Psychiatric:        Mood and Affect: Mood normal.       10/19/2022    1:00 PM  Results of the Epworth flowsheet  Sitting  and reading 3  Watching TV 3  Sitting, inactive in a public place (e.g. a theatre or a meeting) 2  As a passenger in a car for an hour without a break 2  Lying down to rest in the afternoon when circumstances permit 2  Sitting and talking to someone 0  Sitting quietly after a lunch without alcohol 2  In a car, while stopped for a few minutes in traffic 0  Total score 14     Data Reviewed: No previous sleep studies available  No recent PFTs   Assessment:  Moderate probability of significant obstructive sleep apnea with witnessed apneas, nonrestorative sleep, significant snoring, daytime sleepiness  Excessive daytime sleepiness  Restless legs -Currently on gabapentin  End-stage renal disease on dialysis  Atrial fibrillation  Pathophysiology of sleep disordered breathing discussed with the patient Treatment options discussed with the patient  Plan/Recommendations: Schedule for in lab polysomnogram-split-night study  Continue gabapentin for restless legs -Obtain iron studies  Schedule for pulmonary function tests   Virl Diamond MD [facility] 10/19/2022, 1:27 PM  CC: Kristian Covey, MD

## 2022-10-19 NOTE — Patient Instructions (Addendum)
Schedule for in lab split-night study  Schedule for pulmonary function test  Iron studies  Tentative follow-up in 3 months  Call us with significant concerns

## 2022-10-20 ENCOUNTER — Telehealth: Payer: Self-pay | Admitting: Family Medicine

## 2022-10-20 DIAGNOSIS — N250 Renal osteodystrophy: Secondary | ICD-10-CM | POA: Diagnosis not present

## 2022-10-20 DIAGNOSIS — N186 End stage renal disease: Secondary | ICD-10-CM | POA: Diagnosis not present

## 2022-10-20 DIAGNOSIS — Z992 Dependence on renal dialysis: Secondary | ICD-10-CM | POA: Diagnosis not present

## 2022-10-20 DIAGNOSIS — D631 Anemia in chronic kidney disease: Secondary | ICD-10-CM | POA: Diagnosis not present

## 2022-10-20 DIAGNOSIS — D509 Iron deficiency anemia, unspecified: Secondary | ICD-10-CM | POA: Diagnosis not present

## 2022-10-20 DIAGNOSIS — N2581 Secondary hyperparathyroidism of renal origin: Secondary | ICD-10-CM | POA: Diagnosis not present

## 2022-10-20 LAB — IRON,TIBC AND FERRITIN PANEL
%SAT: 22 % (ref 20–48)
Ferritin: 954 ng/mL — ABNORMAL HIGH (ref 24–380)
Iron: 78 ug/dL (ref 50–180)
TIBC: 359 ug/dL (ref 250–425)

## 2022-10-20 NOTE — Telephone Encounter (Signed)
Pt called to F/U on his Freestyle [employer] ...  Pt states they are no longer making these.  They are now offering [employer] 3 plus.  Pt states he only has 2 days left, but refill request must be in prescription form, or insurance will not pay.  Fax regarding this change was placed in MD's eFax folder.   Please call Pt back, at your earliest convenience, to discuss.

## 2022-10-21 MED ORDER — FREESTYLE LIBRE 3 PLUS SENSOR MISC: 2 refills | Status: DC

## 2022-10-21 NOTE — Telephone Encounter (Signed)
Rx sent 

## 2022-10-22 DIAGNOSIS — N250 Renal osteodystrophy: Secondary | ICD-10-CM | POA: Diagnosis not present

## 2022-10-22 DIAGNOSIS — Z992 Dependence on renal dialysis: Secondary | ICD-10-CM | POA: Diagnosis not present

## 2022-10-22 DIAGNOSIS — D509 Iron deficiency anemia, unspecified: Secondary | ICD-10-CM | POA: Diagnosis not present

## 2022-10-22 DIAGNOSIS — N2581 Secondary hyperparathyroidism of renal origin: Secondary | ICD-10-CM | POA: Diagnosis not present

## 2022-10-22 DIAGNOSIS — D631 Anemia in chronic kidney disease: Secondary | ICD-10-CM | POA: Diagnosis not present

## 2022-10-22 DIAGNOSIS — N186 End stage renal disease: Secondary | ICD-10-CM | POA: Diagnosis not present

## 2022-10-25 ENCOUNTER — Ambulatory Visit: Payer: Medicare Other | Admitting: Family Medicine

## 2022-10-25 DIAGNOSIS — D509 Iron deficiency anemia, unspecified: Secondary | ICD-10-CM | POA: Diagnosis not present

## 2022-10-25 DIAGNOSIS — D631 Anemia in chronic kidney disease: Secondary | ICD-10-CM | POA: Diagnosis not present

## 2022-10-25 DIAGNOSIS — N250 Renal osteodystrophy: Secondary | ICD-10-CM | POA: Diagnosis not present

## 2022-10-25 DIAGNOSIS — N186 End stage renal disease: Secondary | ICD-10-CM | POA: Diagnosis not present

## 2022-10-25 DIAGNOSIS — Z992 Dependence on renal dialysis: Secondary | ICD-10-CM | POA: Diagnosis not present

## 2022-10-25 DIAGNOSIS — N2581 Secondary hyperparathyroidism of renal origin: Secondary | ICD-10-CM | POA: Diagnosis not present

## 2022-10-26 ENCOUNTER — Ambulatory Visit: Payer: Medicare Other | Admitting: Family Medicine

## 2022-10-27 DIAGNOSIS — D631 Anemia in chronic kidney disease: Secondary | ICD-10-CM | POA: Diagnosis not present

## 2022-10-27 DIAGNOSIS — N186 End stage renal disease: Secondary | ICD-10-CM | POA: Diagnosis not present

## 2022-10-27 DIAGNOSIS — D509 Iron deficiency anemia, unspecified: Secondary | ICD-10-CM | POA: Diagnosis not present

## 2022-10-27 DIAGNOSIS — N250 Renal osteodystrophy: Secondary | ICD-10-CM | POA: Diagnosis not present

## 2022-10-27 DIAGNOSIS — Z992 Dependence on renal dialysis: Secondary | ICD-10-CM | POA: Diagnosis not present

## 2022-10-27 DIAGNOSIS — N2581 Secondary hyperparathyroidism of renal origin: Secondary | ICD-10-CM | POA: Diagnosis not present

## 2022-10-29 DIAGNOSIS — D509 Iron deficiency anemia, unspecified: Secondary | ICD-10-CM | POA: Diagnosis not present

## 2022-10-29 DIAGNOSIS — D631 Anemia in chronic kidney disease: Secondary | ICD-10-CM | POA: Diagnosis not present

## 2022-10-29 DIAGNOSIS — Z992 Dependence on renal dialysis: Secondary | ICD-10-CM | POA: Diagnosis not present

## 2022-10-29 DIAGNOSIS — N186 End stage renal disease: Secondary | ICD-10-CM | POA: Diagnosis not present

## 2022-10-29 DIAGNOSIS — N2581 Secondary hyperparathyroidism of renal origin: Secondary | ICD-10-CM | POA: Diagnosis not present

## 2022-10-29 DIAGNOSIS — N250 Renal osteodystrophy: Secondary | ICD-10-CM | POA: Diagnosis not present

## 2022-11-01 DIAGNOSIS — D509 Iron deficiency anemia, unspecified: Secondary | ICD-10-CM | POA: Diagnosis not present

## 2022-11-01 DIAGNOSIS — N2581 Secondary hyperparathyroidism of renal origin: Secondary | ICD-10-CM | POA: Diagnosis not present

## 2022-11-01 DIAGNOSIS — N186 End stage renal disease: Secondary | ICD-10-CM | POA: Diagnosis not present

## 2022-11-01 DIAGNOSIS — N250 Renal osteodystrophy: Secondary | ICD-10-CM | POA: Diagnosis not present

## 2022-11-01 DIAGNOSIS — D631 Anemia in chronic kidney disease: Secondary | ICD-10-CM | POA: Diagnosis not present

## 2022-11-01 DIAGNOSIS — Z992 Dependence on renal dialysis: Secondary | ICD-10-CM | POA: Diagnosis not present

## 2022-11-03 DIAGNOSIS — Z992 Dependence on renal dialysis: Secondary | ICD-10-CM | POA: Diagnosis not present

## 2022-11-03 DIAGNOSIS — D631 Anemia in chronic kidney disease: Secondary | ICD-10-CM | POA: Diagnosis not present

## 2022-11-03 DIAGNOSIS — D509 Iron deficiency anemia, unspecified: Secondary | ICD-10-CM | POA: Diagnosis not present

## 2022-11-03 DIAGNOSIS — N250 Renal osteodystrophy: Secondary | ICD-10-CM | POA: Diagnosis not present

## 2022-11-03 DIAGNOSIS — N186 End stage renal disease: Secondary | ICD-10-CM | POA: Diagnosis not present

## 2022-11-03 DIAGNOSIS — N2581 Secondary hyperparathyroidism of renal origin: Secondary | ICD-10-CM | POA: Diagnosis not present

## 2022-11-04 ENCOUNTER — Other Ambulatory Visit: Payer: Self-pay | Admitting: Family Medicine

## 2022-11-04 ENCOUNTER — Ambulatory Visit: Payer: Medicare Other | Admitting: Cardiology

## 2022-11-05 DIAGNOSIS — D631 Anemia in chronic kidney disease: Secondary | ICD-10-CM | POA: Diagnosis not present

## 2022-11-05 DIAGNOSIS — D509 Iron deficiency anemia, unspecified: Secondary | ICD-10-CM | POA: Diagnosis not present

## 2022-11-05 DIAGNOSIS — N2581 Secondary hyperparathyroidism of renal origin: Secondary | ICD-10-CM | POA: Diagnosis not present

## 2022-11-05 DIAGNOSIS — N250 Renal osteodystrophy: Secondary | ICD-10-CM | POA: Diagnosis not present

## 2022-11-05 DIAGNOSIS — N186 End stage renal disease: Secondary | ICD-10-CM | POA: Diagnosis not present

## 2022-11-05 DIAGNOSIS — Z992 Dependence on renal dialysis: Secondary | ICD-10-CM | POA: Diagnosis not present

## 2022-11-08 ENCOUNTER — Ambulatory Visit: Payer: Medicare Other | Admitting: Nurse Practitioner

## 2022-11-08 DIAGNOSIS — N2581 Secondary hyperparathyroidism of renal origin: Secondary | ICD-10-CM | POA: Diagnosis not present

## 2022-11-08 DIAGNOSIS — N250 Renal osteodystrophy: Secondary | ICD-10-CM | POA: Diagnosis not present

## 2022-11-08 DIAGNOSIS — D509 Iron deficiency anemia, unspecified: Secondary | ICD-10-CM | POA: Diagnosis not present

## 2022-11-08 DIAGNOSIS — N186 End stage renal disease: Secondary | ICD-10-CM | POA: Diagnosis not present

## 2022-11-08 DIAGNOSIS — D631 Anemia in chronic kidney disease: Secondary | ICD-10-CM | POA: Diagnosis not present

## 2022-11-08 DIAGNOSIS — Z992 Dependence on renal dialysis: Secondary | ICD-10-CM | POA: Diagnosis not present

## 2022-11-10 DIAGNOSIS — N250 Renal osteodystrophy: Secondary | ICD-10-CM | POA: Diagnosis not present

## 2022-11-10 DIAGNOSIS — N2581 Secondary hyperparathyroidism of renal origin: Secondary | ICD-10-CM | POA: Diagnosis not present

## 2022-11-10 DIAGNOSIS — D509 Iron deficiency anemia, unspecified: Secondary | ICD-10-CM | POA: Diagnosis not present

## 2022-11-10 DIAGNOSIS — Z992 Dependence on renal dialysis: Secondary | ICD-10-CM | POA: Diagnosis not present

## 2022-11-10 DIAGNOSIS — N186 End stage renal disease: Secondary | ICD-10-CM | POA: Diagnosis not present

## 2022-11-10 DIAGNOSIS — D631 Anemia in chronic kidney disease: Secondary | ICD-10-CM | POA: Diagnosis not present

## 2022-11-12 DIAGNOSIS — D509 Iron deficiency anemia, unspecified: Secondary | ICD-10-CM | POA: Diagnosis not present

## 2022-11-12 DIAGNOSIS — N2581 Secondary hyperparathyroidism of renal origin: Secondary | ICD-10-CM | POA: Diagnosis not present

## 2022-11-12 DIAGNOSIS — D631 Anemia in chronic kidney disease: Secondary | ICD-10-CM | POA: Diagnosis not present

## 2022-11-12 DIAGNOSIS — Z992 Dependence on renal dialysis: Secondary | ICD-10-CM | POA: Diagnosis not present

## 2022-11-12 DIAGNOSIS — N250 Renal osteodystrophy: Secondary | ICD-10-CM | POA: Diagnosis not present

## 2022-11-12 DIAGNOSIS — N186 End stage renal disease: Secondary | ICD-10-CM | POA: Diagnosis not present

## 2022-11-15 DIAGNOSIS — D509 Iron deficiency anemia, unspecified: Secondary | ICD-10-CM | POA: Diagnosis not present

## 2022-11-15 DIAGNOSIS — D631 Anemia in chronic kidney disease: Secondary | ICD-10-CM | POA: Diagnosis not present

## 2022-11-15 DIAGNOSIS — N250 Renal osteodystrophy: Secondary | ICD-10-CM | POA: Diagnosis not present

## 2022-11-15 DIAGNOSIS — Z992 Dependence on renal dialysis: Secondary | ICD-10-CM | POA: Diagnosis not present

## 2022-11-15 DIAGNOSIS — N186 End stage renal disease: Secondary | ICD-10-CM | POA: Diagnosis not present

## 2022-11-15 DIAGNOSIS — N2581 Secondary hyperparathyroidism of renal origin: Secondary | ICD-10-CM | POA: Diagnosis not present

## 2022-11-17 DIAGNOSIS — N2581 Secondary hyperparathyroidism of renal origin: Secondary | ICD-10-CM | POA: Diagnosis not present

## 2022-11-17 DIAGNOSIS — Z992 Dependence on renal dialysis: Secondary | ICD-10-CM | POA: Diagnosis not present

## 2022-11-17 DIAGNOSIS — D631 Anemia in chronic kidney disease: Secondary | ICD-10-CM | POA: Diagnosis not present

## 2022-11-17 DIAGNOSIS — N250 Renal osteodystrophy: Secondary | ICD-10-CM | POA: Diagnosis not present

## 2022-11-17 DIAGNOSIS — N186 End stage renal disease: Secondary | ICD-10-CM | POA: Diagnosis not present

## 2022-11-17 DIAGNOSIS — D509 Iron deficiency anemia, unspecified: Secondary | ICD-10-CM | POA: Diagnosis not present

## 2022-11-19 DIAGNOSIS — N250 Renal osteodystrophy: Secondary | ICD-10-CM | POA: Diagnosis not present

## 2022-11-19 DIAGNOSIS — Z992 Dependence on renal dialysis: Secondary | ICD-10-CM | POA: Diagnosis not present

## 2022-11-19 DIAGNOSIS — N2581 Secondary hyperparathyroidism of renal origin: Secondary | ICD-10-CM | POA: Diagnosis not present

## 2022-11-19 DIAGNOSIS — D631 Anemia in chronic kidney disease: Secondary | ICD-10-CM | POA: Diagnosis not present

## 2022-11-19 DIAGNOSIS — N186 End stage renal disease: Secondary | ICD-10-CM | POA: Diagnosis not present

## 2022-11-19 DIAGNOSIS — D509 Iron deficiency anemia, unspecified: Secondary | ICD-10-CM | POA: Diagnosis not present

## 2022-11-22 DIAGNOSIS — N250 Renal osteodystrophy: Secondary | ICD-10-CM | POA: Diagnosis not present

## 2022-11-22 DIAGNOSIS — D631 Anemia in chronic kidney disease: Secondary | ICD-10-CM | POA: Diagnosis not present

## 2022-11-22 DIAGNOSIS — N2581 Secondary hyperparathyroidism of renal origin: Secondary | ICD-10-CM | POA: Diagnosis not present

## 2022-11-22 DIAGNOSIS — N186 End stage renal disease: Secondary | ICD-10-CM | POA: Diagnosis not present

## 2022-11-22 DIAGNOSIS — D509 Iron deficiency anemia, unspecified: Secondary | ICD-10-CM | POA: Diagnosis not present

## 2022-11-22 DIAGNOSIS — Z992 Dependence on renal dialysis: Secondary | ICD-10-CM | POA: Diagnosis not present

## 2022-11-24 DIAGNOSIS — Z992 Dependence on renal dialysis: Secondary | ICD-10-CM | POA: Diagnosis not present

## 2022-11-24 DIAGNOSIS — D509 Iron deficiency anemia, unspecified: Secondary | ICD-10-CM | POA: Diagnosis not present

## 2022-11-24 DIAGNOSIS — D631 Anemia in chronic kidney disease: Secondary | ICD-10-CM | POA: Diagnosis not present

## 2022-11-24 DIAGNOSIS — N2581 Secondary hyperparathyroidism of renal origin: Secondary | ICD-10-CM | POA: Diagnosis not present

## 2022-11-24 DIAGNOSIS — N186 End stage renal disease: Secondary | ICD-10-CM | POA: Diagnosis not present

## 2022-11-24 DIAGNOSIS — N250 Renal osteodystrophy: Secondary | ICD-10-CM | POA: Diagnosis not present

## 2022-11-26 DIAGNOSIS — D631 Anemia in chronic kidney disease: Secondary | ICD-10-CM | POA: Diagnosis not present

## 2022-11-26 DIAGNOSIS — Z992 Dependence on renal dialysis: Secondary | ICD-10-CM | POA: Diagnosis not present

## 2022-11-26 DIAGNOSIS — D509 Iron deficiency anemia, unspecified: Secondary | ICD-10-CM | POA: Diagnosis not present

## 2022-11-26 DIAGNOSIS — N186 End stage renal disease: Secondary | ICD-10-CM | POA: Diagnosis not present

## 2022-11-26 DIAGNOSIS — N2581 Secondary hyperparathyroidism of renal origin: Secondary | ICD-10-CM | POA: Diagnosis not present

## 2022-11-26 DIAGNOSIS — N250 Renal osteodystrophy: Secondary | ICD-10-CM | POA: Diagnosis not present

## 2022-11-29 ENCOUNTER — Ambulatory Visit: Payer: Medicare Other | Attending: Nurse Practitioner

## 2022-11-29 DIAGNOSIS — N2581 Secondary hyperparathyroidism of renal origin: Secondary | ICD-10-CM | POA: Diagnosis not present

## 2022-11-29 DIAGNOSIS — N250 Renal osteodystrophy: Secondary | ICD-10-CM | POA: Diagnosis not present

## 2022-11-29 DIAGNOSIS — Z992 Dependence on renal dialysis: Secondary | ICD-10-CM | POA: Diagnosis not present

## 2022-11-29 DIAGNOSIS — N186 End stage renal disease: Secondary | ICD-10-CM | POA: Diagnosis not present

## 2022-11-29 DIAGNOSIS — I5022 Chronic systolic (congestive) heart failure: Secondary | ICD-10-CM | POA: Insufficient documentation

## 2022-11-29 DIAGNOSIS — D509 Iron deficiency anemia, unspecified: Secondary | ICD-10-CM | POA: Diagnosis not present

## 2022-11-29 DIAGNOSIS — D631 Anemia in chronic kidney disease: Secondary | ICD-10-CM | POA: Diagnosis not present

## 2022-11-29 MED ORDER — PERFLUTREN LIPID MICROSPHERE
1.0000 mL | INTRAVENOUS | Status: AC | PRN
Start: 2022-11-29 — End: 2022-11-29
  Administered 2022-11-29: 10 mL via INTRAVENOUS

## 2022-11-30 LAB — ECHOCARDIOGRAM LIMITED
AV Peak grad: 2.5 mm[Hg]
Ao pk vel: 0.8 m/s
Area-P 1/2: 1.86 cm2
Calc EF: 33.5 %
Est EF: 40
S' Lateral: 4.6 cm
Single Plane A2C EF: 34.3 %
Single Plane A4C EF: 35.3 %

## 2022-12-01 ENCOUNTER — Ambulatory Visit: Payer: Medicare Other | Admitting: Cardiology

## 2022-12-01 DIAGNOSIS — D631 Anemia in chronic kidney disease: Secondary | ICD-10-CM | POA: Diagnosis not present

## 2022-12-01 DIAGNOSIS — D509 Iron deficiency anemia, unspecified: Secondary | ICD-10-CM | POA: Diagnosis not present

## 2022-12-01 DIAGNOSIS — N2581 Secondary hyperparathyroidism of renal origin: Secondary | ICD-10-CM | POA: Diagnosis not present

## 2022-12-01 DIAGNOSIS — N186 End stage renal disease: Secondary | ICD-10-CM | POA: Diagnosis not present

## 2022-12-01 DIAGNOSIS — Z992 Dependence on renal dialysis: Secondary | ICD-10-CM | POA: Diagnosis not present

## 2022-12-01 DIAGNOSIS — N250 Renal osteodystrophy: Secondary | ICD-10-CM | POA: Diagnosis not present

## 2022-12-03 DIAGNOSIS — N2581 Secondary hyperparathyroidism of renal origin: Secondary | ICD-10-CM | POA: Diagnosis not present

## 2022-12-03 DIAGNOSIS — Z992 Dependence on renal dialysis: Secondary | ICD-10-CM | POA: Diagnosis not present

## 2022-12-03 DIAGNOSIS — D631 Anemia in chronic kidney disease: Secondary | ICD-10-CM | POA: Diagnosis not present

## 2022-12-03 DIAGNOSIS — D509 Iron deficiency anemia, unspecified: Secondary | ICD-10-CM | POA: Diagnosis not present

## 2022-12-03 DIAGNOSIS — N186 End stage renal disease: Secondary | ICD-10-CM | POA: Diagnosis not present

## 2022-12-03 DIAGNOSIS — N250 Renal osteodystrophy: Secondary | ICD-10-CM | POA: Diagnosis not present

## 2022-12-06 DIAGNOSIS — N250 Renal osteodystrophy: Secondary | ICD-10-CM | POA: Diagnosis not present

## 2022-12-06 DIAGNOSIS — D509 Iron deficiency anemia, unspecified: Secondary | ICD-10-CM | POA: Diagnosis not present

## 2022-12-06 DIAGNOSIS — Z992 Dependence on renal dialysis: Secondary | ICD-10-CM | POA: Diagnosis not present

## 2022-12-06 DIAGNOSIS — N2581 Secondary hyperparathyroidism of renal origin: Secondary | ICD-10-CM | POA: Diagnosis not present

## 2022-12-06 DIAGNOSIS — N186 End stage renal disease: Secondary | ICD-10-CM | POA: Diagnosis not present

## 2022-12-06 DIAGNOSIS — D631 Anemia in chronic kidney disease: Secondary | ICD-10-CM | POA: Diagnosis not present

## 2022-12-08 DIAGNOSIS — D631 Anemia in chronic kidney disease: Secondary | ICD-10-CM | POA: Diagnosis not present

## 2022-12-08 DIAGNOSIS — Z992 Dependence on renal dialysis: Secondary | ICD-10-CM | POA: Diagnosis not present

## 2022-12-08 DIAGNOSIS — N2581 Secondary hyperparathyroidism of renal origin: Secondary | ICD-10-CM | POA: Diagnosis not present

## 2022-12-08 DIAGNOSIS — D509 Iron deficiency anemia, unspecified: Secondary | ICD-10-CM | POA: Diagnosis not present

## 2022-12-08 DIAGNOSIS — N186 End stage renal disease: Secondary | ICD-10-CM | POA: Diagnosis not present

## 2022-12-08 DIAGNOSIS — N250 Renal osteodystrophy: Secondary | ICD-10-CM | POA: Diagnosis not present

## 2022-12-10 DIAGNOSIS — N250 Renal osteodystrophy: Secondary | ICD-10-CM | POA: Diagnosis not present

## 2022-12-10 DIAGNOSIS — N186 End stage renal disease: Secondary | ICD-10-CM | POA: Diagnosis not present

## 2022-12-10 DIAGNOSIS — Z992 Dependence on renal dialysis: Secondary | ICD-10-CM | POA: Diagnosis not present

## 2022-12-10 DIAGNOSIS — D631 Anemia in chronic kidney disease: Secondary | ICD-10-CM | POA: Diagnosis not present

## 2022-12-10 DIAGNOSIS — D509 Iron deficiency anemia, unspecified: Secondary | ICD-10-CM | POA: Diagnosis not present

## 2022-12-10 DIAGNOSIS — N2581 Secondary hyperparathyroidism of renal origin: Secondary | ICD-10-CM | POA: Diagnosis not present

## 2022-12-13 DIAGNOSIS — N186 End stage renal disease: Secondary | ICD-10-CM | POA: Diagnosis not present

## 2022-12-13 DIAGNOSIS — N250 Renal osteodystrophy: Secondary | ICD-10-CM | POA: Diagnosis not present

## 2022-12-13 DIAGNOSIS — N2581 Secondary hyperparathyroidism of renal origin: Secondary | ICD-10-CM | POA: Diagnosis not present

## 2022-12-13 DIAGNOSIS — Z992 Dependence on renal dialysis: Secondary | ICD-10-CM | POA: Diagnosis not present

## 2022-12-13 DIAGNOSIS — D509 Iron deficiency anemia, unspecified: Secondary | ICD-10-CM | POA: Diagnosis not present

## 2022-12-14 ENCOUNTER — Ambulatory Visit: Payer: Medicare Other | Attending: Nurse Practitioner | Admitting: Nurse Practitioner

## 2022-12-14 ENCOUNTER — Encounter: Payer: Self-pay | Admitting: Nurse Practitioner

## 2022-12-14 VITALS — BP 118/72 | HR 82 | Ht 70.0 in | Wt 202.0 lb

## 2022-12-14 DIAGNOSIS — I739 Peripheral vascular disease, unspecified: Secondary | ICD-10-CM | POA: Diagnosis not present

## 2022-12-14 DIAGNOSIS — I2510 Atherosclerotic heart disease of native coronary artery without angina pectoris: Secondary | ICD-10-CM

## 2022-12-14 DIAGNOSIS — I5022 Chronic systolic (congestive) heart failure: Secondary | ICD-10-CM

## 2022-12-14 DIAGNOSIS — I4891 Unspecified atrial fibrillation: Secondary | ICD-10-CM | POA: Diagnosis not present

## 2022-12-14 DIAGNOSIS — I10 Essential (primary) hypertension: Secondary | ICD-10-CM

## 2022-12-14 NOTE — Progress Notes (Unsigned)
Cardiology Office Note:  .   Date: 12/14/2022 ID:  ALEXANDRO ELTZ, DOB [birth year], MRN [id number] PCP: Kristian Covey, MD  [facility] Providers Cardiologist:  Dina Rich, MD Electrophysiologist:  Sherryl Manges, MD    History of Present Illness: .   MARCIAL THAI is a 62 y.o. male with a PMH of chronic systolic CHF, s/p ICD/ NICM, CAD, COPD, PAD, s/p bilateral BKA, hyperlipidemia, hypertension, type 2 diabetes, mild bilateral carotid artery stenosis, ESRD on HD, who presents today for 1 month follow-up.  Echocardiogram in 2015 revealed EF 25%.  Cardiac catheterization in 2015 revealed diffuse nonobstructive disease, worst lesion 80% small distal LAD.  Echocardiogram in EF improved to 50 to 55% in 2017.  Has had some of his heart failure medications lowered due to low BPs on hemodialysis.  Fluid status managed by hemodialysis.  Last seen by Dr. Dina Rich on Jun 02, 2022.  Was overall doing well at that time.  Medical therapy was noted to be limited due to history of low BPs during hemodialysis as well as history of dizziness.  In the interim, he has had several ED visits and admissions. 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 Neurology consulted. Recommended to continue Aspirin, statin, and Plavix, and follow-up with Neuro outpatient. Echo and Cardiac event monitoring also recommended.   Hospital admission 08/18/2022 to 08/26/2022 for acute respiratory failure secondary to volume overload.  Required extra HD sessions.  Hospital course also prolonged secondary to new onset A-fib with new diagnosis of cardiomyopathy with EF 30 to 35%.  Cardiology was consulted.  Was on diltiazem drip temporarily, converted to sinus rhythm.  GDMT limited due to low BPs.  Discharge home on carvedilol and Eliquis.  Readmitted end of August 2024 for acute respiratory failure with hypoxia, multifactorial in  setting of COVID-19 infection, pneumonia, and fluid overload.  BiPAP use to stabilize breathing.  EF 30 to 35% as seen from 08/22/2022.  GDMT limited by low BP.  Hospital course noted by acute A-fib with RVR most likely due to acute infection of COVID and pneumonia, transitioned off amiodarone drip, converted to sinus rhythm.  ED visit 09/24/2022 for shortness of breath.  EMS reported hypoxia with a heart rate 150s initially.  Was placed on CPAP heart rate came down to 110.  Patient denied chest pain.  Chest x-ray did show evidence of volume overload.  Venous gas did show some element of hypoxia. Troponins 43 >> 49. Did require BiPAP in ED, weaned to oxygen via [place]. Stated he was due for dialysis that day and would need dialysis session then.  He arrived back from dialysis, patient discharged back home.  Today he presents for follow-up. He states he is doing well. Denies any chest pain, shortness of breath, palpitations, syncope, presyncope, dizziness, orthopnea, PND, swelling or significant weight changes, acute bleeding, or claudication.   ROS: Negative. See HPI.   Studies Reviewed: [name]    Lexiscan 08/2022:  IMPRESSION: 1. There is a large in size, severe, fixed defect involving the basal inferolateral, mid inferolateral, mid anterolateral and apical lateral segments. This is consistent with infarction involving the left circumflex coronary artery.   2. Globally diminished left ventricular wall motion and left ventricular dilatation.   3. Left ventricular ejection fraction 18%   4. Non invasive risk stratification*: High  Echo 08/2022:  1. Left ventricular ejection fraction, by estimation, is 30 to 35%.  The  left ventricle has moderately decreased function. The left ventricle  demonstrates global hypokinesis. There is moderate concentric left  ventricular hypertrophy. Left ventricular  diastolic parameters are consistent with Grade II diastolic dysfunction  (pseudonormalization). Elevated  left ventricular end-diastolic pressure.   2. Right ventricular systolic function is normal. The right ventricular  size is normal.   3. Left atrial size was mildly dilated.   4. The mitral valve is normal in structure. Moderate mitral valve  regurgitation. No evidence of mitral stenosis.   5. The aortic valve is normal in structure. Aortic valve regurgitation is  not visualized. Aortic valve sclerosis is present, with no evidence of  aortic valve stenosis.   6. The inferior vena cava is normal in size with greater than 50%  respiratory variability, suggesting right atrial pressure of 3 mmHg.   Carotid duplex 07/2021:  Summary:  Right Carotid: Velocities in the right ICA are consistent with a 1-39%  stenosis.   Left Carotid: Velocities in the left ICA are consistent with a 1-39%  stenosis. Non-hemodynamically significant plaque <50% noted in the CCA. The ECA appears >50% stenosed.   Vertebrals:  Right vertebral artery demonstrates antegrade flow. Left  vertebral artery demonstrates retrograde flow.  Subclavians: Left subclavian artery flow was disturbed. Normal flow  hemodynamics were seen in the right subclavian artery.  ABI's 10/2019: Summary:  Left: Resting left ankle-brachial index indicates noncompressible left  lower extremity arteries. The left toe-brachial index is abnormal. LT  Great toe pressure = 25 mmHg. Risk Assessment/Calculations:    CHA2DS2-VASc Score = 4   This indicates a 4.8% annual risk of stroke. The patient's score is based upon: CHF History: 1 HTN History: 1 Diabetes History: 1 Stroke History: 0 Vascular Disease History: 1 Age Score: 0 Gender Score: 0  No BP recorded.  {Refresh Note OR Click here to enter BP  :1}***  The 10-year ASCVD risk score (Arnett DK, et al., 2019) is: 14.3%   Values used to calculate the score:     Age: 34 years     Sex: Male     Is Non-Hispanic African American: No     Diabetic: Yes     Tobacco smoker: No     Systolic  Blood Pressure: 116 mmHg     Is BP treated: Yes     HDL Cholesterol: 38.5 mg/dL     Total Cholesterol: 132 mg/dL      Physical Exam:   VS:  There were no vitals taken for this visit.   Wt Readings from Last 3 Encounters:  10/19/22 205 lb 6.4 oz (93.2 kg)  10/11/22 208 lb (94.3 kg)  09/24/22 213 lb 13.5 oz (97 kg)    GEN: Well nourished, well developed in no acute distress NECK: No JVD; No carotid bruits CARDIAC: S1/S2, irregularly irregular rhythm, no murmurs, rubs, gallops RESPIRATORY:  Clear to auscultation without rales, wheezing or rhonchi  ABDOMEN: Soft, non-tender, non-distended EXTREMITIES:  No edema; bilateral BKA  ASSESSMENT AND PLAN: .    Chronic systolic CHF Stage C, NYHA class I symptoms. EF 08/2022 30-35%, reduced in setting of acute illness/A-fib with RVR. GDMT limited d/t soft BP and ESRD. Continue Carvedilol. Euvolemic and well compensated on exam. Weights are stable. Low sodium diet, fluid restriction <2L, and daily weights encouraged. Educated to contact our office for weight gain of 2 lbs overnight or 5 lbs in one week.  Will repeat limited echocardiogram in November 2024 to evaluate EF.  CAD Stable with no  anginal symptoms. No indication for ischemic evaluation. Recent Lexiscan showed infarct, negative for ischemia. Continue current medication regimen. Heart healthy diet and regular cardiovascular exercise encouraged.   A-fib Denies any recent tachycardia or palpitations. HR well controlled today. Continue Coreg. Continue Eliquis for CHA2DS2-VASc score of 4. Heart healthy diet and regular cardiovascular exercise encouraged.  Recommended Kardia mobile to evaluate his atrial fibrillation.  4. HTN Past issues with low BP's during HD. Denies any recent issues. Continue midodrine. Discussed to monitor BP at home at least 2 hours after medications and sitting for 5-10 minutes.   5. PAD, s/p BKA Previous history of mild carotid artery stenosis by carotid duplex.  Would  recommend to repeat carotid duplex in 2025.  Denies any symptoms.  Continue current medication regimen. Heart healthy diet and regular cardiovascular exercise encouraged.   Dispo: Follow-up with me/[facility] in 2-3 months or sooner if anything changes.   Signed, Sharlene Dory, NP

## 2022-12-14 NOTE — Patient Instructions (Addendum)
 Medication Instructions:  Your physician recommends that you continue on your current medications as directed. Please refer to the Current Medication list given to you today.  Labwork: None   Testing/Procedures: Your physician has requested that you have a carotid duplex. This test is an ultrasound of the carotid arteries in your neck. It looks at blood flow through these arteries that supply the brain with blood. Allow one hour for this exam. There are no restrictions or special instructions.  Follow-Up: Your physician recommends that you schedule a follow-up appointment in: 6 Months   Any Other Special Instructions Will Be Listed Below (If Applicable).  If you need a refill on your cardiac medications before your next appointment, please call your pharmacy.

## 2022-12-15 DIAGNOSIS — N186 End stage renal disease: Secondary | ICD-10-CM | POA: Diagnosis not present

## 2022-12-15 DIAGNOSIS — N2581 Secondary hyperparathyroidism of renal origin: Secondary | ICD-10-CM | POA: Diagnosis not present

## 2022-12-15 DIAGNOSIS — Z992 Dependence on renal dialysis: Secondary | ICD-10-CM | POA: Diagnosis not present

## 2022-12-15 DIAGNOSIS — N250 Renal osteodystrophy: Secondary | ICD-10-CM | POA: Diagnosis not present

## 2022-12-15 DIAGNOSIS — D509 Iron deficiency anemia, unspecified: Secondary | ICD-10-CM | POA: Diagnosis not present

## 2022-12-16 ENCOUNTER — Other Ambulatory Visit: Payer: Self-pay | Admitting: Family Medicine

## 2022-12-16 ENCOUNTER — Ambulatory Visit: Payer: Medicare Other | Admitting: Family Medicine

## 2022-12-17 DIAGNOSIS — Z992 Dependence on renal dialysis: Secondary | ICD-10-CM | POA: Diagnosis not present

## 2022-12-17 DIAGNOSIS — N250 Renal osteodystrophy: Secondary | ICD-10-CM | POA: Diagnosis not present

## 2022-12-17 DIAGNOSIS — D509 Iron deficiency anemia, unspecified: Secondary | ICD-10-CM | POA: Diagnosis not present

## 2022-12-17 DIAGNOSIS — N2581 Secondary hyperparathyroidism of renal origin: Secondary | ICD-10-CM | POA: Diagnosis not present

## 2022-12-17 DIAGNOSIS — N186 End stage renal disease: Secondary | ICD-10-CM | POA: Diagnosis not present

## 2022-12-18 ENCOUNTER — Other Ambulatory Visit: Payer: Self-pay | Admitting: Family Medicine

## 2022-12-19 ENCOUNTER — Ambulatory Visit: Payer: Medicare Other | Admitting: Family Medicine

## 2022-12-20 DIAGNOSIS — Z992 Dependence on renal dialysis: Secondary | ICD-10-CM | POA: Diagnosis not present

## 2022-12-20 DIAGNOSIS — N250 Renal osteodystrophy: Secondary | ICD-10-CM | POA: Diagnosis not present

## 2022-12-20 DIAGNOSIS — N186 End stage renal disease: Secondary | ICD-10-CM | POA: Diagnosis not present

## 2022-12-20 DIAGNOSIS — D509 Iron deficiency anemia, unspecified: Secondary | ICD-10-CM | POA: Diagnosis not present

## 2022-12-20 DIAGNOSIS — N2581 Secondary hyperparathyroidism of renal origin: Secondary | ICD-10-CM | POA: Diagnosis not present

## 2022-12-21 ENCOUNTER — Encounter: Payer: Self-pay | Admitting: Family Medicine

## 2022-12-21 ENCOUNTER — Ambulatory Visit: Payer: Medicare Other | Admitting: Family Medicine

## 2022-12-21 VITALS — BP 146/76 | HR 70 | Temp 98.0°F | Ht 70.0 in | Wt 204.6 lb

## 2022-12-21 DIAGNOSIS — I480 Paroxysmal atrial fibrillation: Secondary | ICD-10-CM

## 2022-12-21 DIAGNOSIS — Z89512 Acquired absence of left leg below knee: Secondary | ICD-10-CM

## 2022-12-21 DIAGNOSIS — E785 Hyperlipidemia, unspecified: Secondary | ICD-10-CM

## 2022-12-21 DIAGNOSIS — E1169 Type 2 diabetes mellitus with other specified complication: Secondary | ICD-10-CM | POA: Diagnosis not present

## 2022-12-21 DIAGNOSIS — Z794 Long term (current) use of insulin: Secondary | ICD-10-CM | POA: Diagnosis not present

## 2022-12-21 DIAGNOSIS — Z89511 Acquired absence of right leg below knee: Secondary | ICD-10-CM

## 2022-12-21 DIAGNOSIS — J309 Allergic rhinitis, unspecified: Secondary | ICD-10-CM | POA: Diagnosis not present

## 2022-12-21 LAB — POCT GLYCOSYLATED HEMOGLOBIN (HGB A1C): Hemoglobin A1C: 7.3 % — AB (ref 4.0–5.6)

## 2022-12-21 MED ORDER — APIXABAN 5 MG PO TABS: 5.0000 mg | ORAL_TABLET | Freq: Two times a day (BID) | ORAL | 3 refills | Status: DC

## 2022-12-21 MED ORDER — FREESTYLE LIBRE 3 PLUS SENSOR MISC: 3 refills | Status: DC

## 2022-12-21 NOTE — Patient Instructions (Addendum)
Start the [facility] for nasal allergies.    OK to use nasal saline irrigation with distilled (NOT TAP) water  A1C improved to 7.3%.    Keep up the good work.

## 2022-12-21 NOTE — Progress Notes (Signed)
Established Patient Office Visit  Subjective   Patient ID: Anthony Bullock, male    DOB: 1960-07-01  Age: 62 y.o. MRN: [id number]  Chief Complaint  Patient presents with   Medical Management of Chronic Issues    HPI   Anthony Bullock is seen for medical follow-up.  Multiple chronic problems including:  -Chronic systolic heart failure -History of intermittent atrial fibrillation -Hypertension -Nonischemic cardiomyopathy -Peripheral vascular disease -COPD -Type 2 diabetes on chronic insulin therapy -End-stage renal disease on dialysis -History of urethral stricture -History of diabetic neuropathy -Status post bilateral below-knee amputations -Dyslipidemia  He is followed by multiple specialists.  Just recently saw a cardiologist.  Remains on regular dialysis.  No recent volume overload.  He has been pay more attention to diet in terms of sodium and fluid restriction.  Diabetes poorly controlled for quite some time.  Last A1c 8.7%.  Does have freestyle Josephine Igo which is helping him get a better handle on diet effects on his blood sugar.  He remains on sliding scale with Humalog and takes Lantus 14 units daily.  No recent hypoglycemic episodes.  Takes high-dose statin with Lipitor 80 mg daily also fenofibrate.  Last lipids were checked last June.  Has had some recent rhinorrhea which has been mostly clear.  No fever.  No purulent secretions.  Has been reluctant to take any medications because of his chronic medical issues.  Knows not to take decongestants  Has had flu vaccine and also had recent RSV vaccine. Has been scheduled for sleep study this week to rule out obstructive sleep apnea  Requesting refills of Eliquis for 3 months and also for his freestyle libre sensors  Past Medical History:  Diagnosis Date   AICD (automatic cardioverter/defibrillator) present    [employer]   Allergic rhinitis    Anemia    Arthritis    Chronic systolic heart failure (HCC)    a. ECHO (12/2012) EF  25-30%, HK entireanteroseptal myocardium //  b.  EF 25%, diffuse HK, grade 1 diastolic dysfunction, MAC, mild LAE, normal RVSF, trivial pericardial effusion   COPD (chronic obstructive pulmonary disease) (HCC)    Diabetes mellitus type II    Diabetic nephropathy (HCC)    Diabetic neuropathy (HCC)    ESRD on hemodialysis (HCC)    started HD June 2017, goes to [facility] HD unit, Dr Fausto Skillern   History of cardiac catheterization    a.Myoview 1/15:  There is significant left ventricular dysfunction. There may be slight scar at the apex. There is no significant ischemia. LV Ejection Fraction: 27%  //  b. RHC/LHC (1/15) with mean RA 6, PA 47/22 mean 33, mean PCWP 20, PVR 2.5 WU, CI 2.5; 80% dLAD stenosis, 70% diffuse large D.     History of kidney stones    Hyperlipidemia    Hypertension    Kidney stones    NICM (nonischemic cardiomyopathy) (HCC)    Primarily nonischemic.  Echo (12/14) with EF 25-30%.  Echo (3/15) with EF 25%, mild to moderately dilated LV, normal RV size and systolic function.     Osteomyelitis (HCC)    left fifth ray   Pneumonia    Urethral stricture    Wears glasses    Past Surgical History:  Procedure Laterality Date   ABDOMINAL AORTOGRAM W/LOWER EXTREMITY N/A 03/30/2016   Procedure: Abdominal Aortogram w/Lower Extremity;  Surgeon: Chuck Hint, MD;  Location: [facility] INVASIVE CV LAB;  Service: Cardiovascular;  Laterality: N/A;   AMPUTATION Right 04/26/2016  Procedure: Right Below Knee Amputation;  Surgeon: Nadara Mustard, MD;  Location: [facility] OR;  Service: Orthopedics;  Laterality: Right;   AMPUTATION Left 08/21/2019   Procedure: LEFT FOOT 5TH RAY AMPUTATION;  Surgeon: Nadara Mustard, MD;  Location: [facility] OR;  Service: Orthopedics;  Laterality: Left;   AMPUTATION Left 11/13/2019   Procedure: LEFT BELOW KNEE AMPUTATION;  Surgeon: Nadara Mustard, MD;  Location: [facility] OR;  Service: Orthopedics;  Laterality: Left;   AV FISTULA PLACEMENT Right 09/08/2015   Procedure: INSERTION OF  4-43mm x 45cm  ARTERIOVENOUS (AV) GORE-TEX GRAFT RIGHT UPPER  ARM;  Surgeon: Chuck Hint, MD;  Location: [facility] OR;  Service: Vascular;  Laterality: Right;   AV FISTULA PLACEMENT Left 01/14/2016   Procedure: CREATION OF LEFT UPPER ARM ARTERIOVENOUS FISTULA;  Surgeon: Chuck Hint, MD;  Location: [facility] OR;  Service: Vascular;  Laterality: Left;   BASCILIC VEIN TRANSPOSITION Right 08/22/2014   Procedure: RIGHT UPPER ARM BASCILIC VEIN TRANSPOSITION;  Surgeon: Chuck Hint, MD;  Location: [facility] OR;  Service: Vascular;  Laterality: Right;   BELOW KNEE LEG AMPUTATION Right 04/26/2016   CARDIAC CATHETERIZATION     CARDIAC DEFIBRILLATOR PLACEMENT  06/27/2013   Sub Q       BY DR Graciela Husbands   CATARACT EXTRACTION W/PHACO Right 08/06/2018   Procedure: CATARACT EXTRACTION PHACO AND INTRAOCULAR LENS PLACEMENT (IOC);  Surgeon: Fabio Pierce, MD;  Location: [facility] ORS;  Service: Ophthalmology;  Laterality: Right;  CDE: 4.06   CATARACT EXTRACTION W/PHACO Left 08/20/2018   Procedure: CATARACT EXTRACTION PHACO AND INTRAOCULAR LENS PLACEMENT (IOC);  Surgeon: Fabio Pierce, MD;  Location: [facility] ORS;  Service: Ophthalmology;  Laterality: Left;  CDE: 6.76   COLONOSCOPY WITH PROPOFOL N/A 07/22/2015   Procedure: COLONOSCOPY WITH PROPOFOL;  Surgeon: Sherrilyn Rist, MD;  Location: [facility] ENDOSCOPY;  Service: Gastroenterology;  Laterality: N/A;   FEMORAL-POPLITEAL BYPASS GRAFT Right 03/31/2016   Procedure: BYPASS GRAFT FEMORAL-POPLITEAL ARTERY USING RIGHT GREATER SAPHENOUS NONREVERSED VEIN;  Surgeon: Chuck Hint, MD;  Location: [facility] OR;  Service: Vascular;  Laterality: Right;   HERNIA REPAIR     I & D EXTREMITY Right 03/31/2016   Procedure: IRRIGATION AND DEBRIDEMENT FOOT;  Surgeon: Chuck Hint, MD;  Location: [facility] OR;  Service: Vascular;  Laterality: Right;   IMPLANTABLE CARDIOVERTER DEFIBRILLATOR IMPLANT N/A 06/27/2013   Procedure: SUB Q ICD;  Surgeon: Duke Salvia, MD;  Location: [facility] CATH LAB;  Service:  Cardiovascular;  Laterality: N/A;   INTRAOPERATIVE ARTERIOGRAM Right 03/31/2016   Procedure: INTRA OPERATIVE ARTERIOGRAM;  Surgeon: Chuck Hint, MD;  Location: [facility] OR;  Service: Vascular;  Laterality: Right;   IR GENERIC HISTORICAL Right 11/30/2015   IR THROMBECTOMY AV FISTULA W/THROMBOLYSIS/PTA INC/SHUNT/IMG RIGHT 11/30/2015 Irish Lack, MD [facility] RAD   IR GENERIC HISTORICAL  11/30/2015   IR US GUIDE VASC ACCESS RIGHT 11/30/2015 Irish Lack, MD [facility]   IR GENERIC HISTORICAL Right 12/15/2015   IR THROMBECTOMY AV FISTULA W/THROMBOLYSIS/PTA/STENT INC/SHUNT/IMG RT 12/15/2015 Oley Balm, MD [facility]   IR GENERIC HISTORICAL  12/15/2015   IR US GUIDE VASC ACCESS RIGHT 12/15/2015 Oley Balm, MD [facility]   IR GENERIC HISTORICAL  12/28/2015   IR FLUORO GUIDE CV LINE RIGHT 12/28/2015 Jolaine Click, MD [facility]   IR GENERIC HISTORICAL  12/28/2015   IR US GUIDE VASC ACCESS RIGHT 12/28/2015 Jolaine Click, MD [facility]   LEFT A ND RIGHT HEART CATH  01/30/2013   DR Jones Broom   LEFT AND RIGHT  HEART CATHETERIZATION WITH CORONARY ANGIOGRAM N/A 01/30/2013   Procedure: LEFT AND RIGHT HEART CATHETERIZATION WITH CORONARY ANGIOGRAM;  Surgeon: Dolores Patty, MD;  Location: [facility] CATH LAB;  Service: Cardiovascular;  Laterality: N/A;   PERIPHERAL VASCULAR CATHETERIZATION Right 01/26/2015   Procedure: A/V Fistulagram;  Surgeon: Chuck Hint, MD;  Location: [facility] INVASIVE CV LAB;  Service: Cardiovascular;  Laterality: Right;   reapea urethral surgery for recurrent obstruction  2011   TOTAL KNEE ARTHROPLASTY Right 2007   VEIN HARVEST Right 03/31/2016   Procedure: RIGHT GREATER SAPHENOUS VEIN HARVEST;  Surgeon: Chuck Hint, MD;  Location: [facility] OR;  Service: Vascular;  Laterality: Right;    reports that he quit smoking about 12 years ago. His smoking use included cigarettes. He started smoking about 44 years ago. He has a 64 pack-year smoking history. He has never  used smokeless tobacco. He reports that he does not drink alcohol and does not use drugs. family history includes Alcohol abuse in his father; Bladder Cancer in his mother; Diabetes in his maternal grandmother; Heart Problems in his maternal grandmother; Heart disease in his maternal grandfather; Melanoma in his father; Prostate cancer in his maternal grandfather; Stroke in his maternal grandmother. Allergies  Allergen Reactions   Epoetin Alfa Other (See Comments)    unknown   Ferumoxytol Other (See Comments)    unknown   Morphine Sulfate Rash and Other (See Comments)    Itches all over, red spots    Review of Systems  Constitutional:  Negative for fever and malaise/fatigue.  Eyes:  Negative for blurred vision.  Respiratory:  Negative for shortness of breath.   Cardiovascular:  Negative for chest pain.  Gastrointestinal:  Negative for abdominal pain.  Neurological:  Negative for dizziness, weakness and headaches.      Objective:     BP (!) 146/76 (BP Location: Left Arm, Patient Position: Sitting, Cuff Size: Normal)   Pulse 70   Temp 98 F (36.7 C) (Oral)   Ht 5\' 10"  (1.778 m)   Wt 204 lb 9.6 oz (92.8 kg)   SpO2 99%   BMI 29.36 kg/m  BP Readings from Last 3 Encounters:  12/21/22 (!) 146/76  12/14/22 118/72  10/19/22 116/60   Wt Readings from Last 3 Encounters:  12/21/22 204 lb 9.6 oz (92.8 kg)  12/14/22 202 lb (91.6 kg)  10/19/22 205 lb 6.4 oz (93.2 kg)      Physical Exam Vitals reviewed.  Constitutional:      Appearance: He is well-developed.  Neck:     Thyroid: No thyromegaly.  Cardiovascular:     Rate and Rhythm: Normal rate and regular rhythm.  Pulmonary:     Effort: Pulmonary effort is normal. No respiratory distress.     Breath sounds: Normal breath sounds. No wheezing or rales.  Musculoskeletal:     Cervical back: Neck supple.  Neurological:     General: No focal deficit present.     Mental Status: He is alert and oriented to person, place, and time.       Results for orders placed or performed in visit on 12/21/22  POC HgB A1c  Result Value Ref Range   Hemoglobin A1C 7.3 (A) 4.0 - 5.6 %   HbA1c POC (<> result, manual entry)     HbA1c, POC (prediabetic range)     HbA1c, POC (controlled diabetic range)      Last CBC Lab Results  Component Value Date   WBC 11.9 (H) 09/24/2022   HGB 12.0 (  L) 09/24/2022   HCT 37.5 (L) 09/24/2022   MCV 93.5 09/24/2022   MCH 29.9 09/24/2022   RDW 17.7 (H) 09/24/2022   PLT 149 (L) 09/24/2022   Last metabolic panel Lab Results  Component Value Date   GLUCOSE 264 (H) 09/24/2022   NA 134 (L) 09/24/2022   K 3.3 (L) 09/24/2022   CL 96 (L) 09/24/2022   CO2 24 09/24/2022   BUN 43 (H) 09/24/2022   CREATININE 8.65 (H) 09/24/2022   GFRNONAA 6 (L) 09/24/2022   CALCIUM 7.7 (L) 09/24/2022   PHOS 4.8 (H) 09/24/2022   PROT 6.0 (L) 09/24/2022   ALBUMIN 2.8 (L) 09/24/2022   BILITOT 0.8 09/24/2022   ALKPHOS 73 09/24/2022   AST 22 09/24/2022   ALT 22 09/24/2022   ANIONGAP 14 09/24/2022   Last lipids Lab Results  Component Value Date   CHOL 132 06/24/2022   HDL 38.50 (L) 06/24/2022   LDLCALC 57 06/24/2022   LDLDIRECT 134.0 04/25/2011   TRIG 181.0 (H) 06/24/2022   CHOLHDL 3 06/24/2022   Last hemoglobin A1c Lab Results  Component Value Date   HGBA1C 7.3 (A) 12/21/2022   Last thyroid functions Lab Results  Component Value Date   TSH 1.475 08/25/2022      The 10-year ASCVD risk score (Arnett DK, et al., 2019) is: 20.8%    Assessment & Plan:   #1 type 2 diabetes.  Longstanding history of poor control but improving with A1c today 7.3%.  This is down from 8.7% previously.  This is the best he has been is quite some time.  He thinks freestyle Josephine Igo is helping him to monitor diet.  Doing well currently with Lantus and Humalog sliding scale.  Continue current regimen.  Hopefully this will continue to trend down  #2 history of atrial fibrillation.  He is on chronic Eliquis therapy.  Refill  Eliquis for 6 months.  #3 rhinorrhea.  Suspect allergic rhinitis.  Recommend over-the-counter Flonase.  If he takes antihistamine such as Zyrtec recommend lower dose of 5 mg with his chronic kidney disease  #4 hyperlipidemia.  On high-dose statin.  Lipids were checked last June and stable.  LDL cholesterol then 57.  #5 health maintenance-flu vaccine and RSV given  Recommend routine follow-up in 4 months and sooner as needed   Return in about 4 months (around 04/21/2023).    Evelena Peat, MD

## 2022-12-22 ENCOUNTER — Ambulatory Visit (HOSPITAL_BASED_OUTPATIENT_CLINIC_OR_DEPARTMENT_OTHER): Payer: Medicare Other | Attending: Pulmonary Disease | Admitting: Pulmonary Disease

## 2022-12-22 DIAGNOSIS — R0683 Snoring: Secondary | ICD-10-CM | POA: Insufficient documentation

## 2022-12-22 DIAGNOSIS — Z992 Dependence on renal dialysis: Secondary | ICD-10-CM | POA: Diagnosis not present

## 2022-12-22 DIAGNOSIS — D509 Iron deficiency anemia, unspecified: Secondary | ICD-10-CM | POA: Diagnosis not present

## 2022-12-22 DIAGNOSIS — N186 End stage renal disease: Secondary | ICD-10-CM | POA: Diagnosis not present

## 2022-12-22 DIAGNOSIS — N2581 Secondary hyperparathyroidism of renal origin: Secondary | ICD-10-CM | POA: Diagnosis not present

## 2022-12-22 DIAGNOSIS — G4733 Obstructive sleep apnea (adult) (pediatric): Secondary | ICD-10-CM | POA: Diagnosis not present

## 2022-12-22 DIAGNOSIS — G4736 Sleep related hypoventilation in conditions classified elsewhere: Secondary | ICD-10-CM | POA: Diagnosis not present

## 2022-12-22 DIAGNOSIS — N250 Renal osteodystrophy: Secondary | ICD-10-CM | POA: Diagnosis not present

## 2022-12-22 DIAGNOSIS — Z87898 Personal history of other specified conditions: Secondary | ICD-10-CM

## 2022-12-24 DIAGNOSIS — D509 Iron deficiency anemia, unspecified: Secondary | ICD-10-CM | POA: Diagnosis not present

## 2022-12-24 DIAGNOSIS — N2581 Secondary hyperparathyroidism of renal origin: Secondary | ICD-10-CM | POA: Diagnosis not present

## 2022-12-24 DIAGNOSIS — Z992 Dependence on renal dialysis: Secondary | ICD-10-CM | POA: Diagnosis not present

## 2022-12-24 DIAGNOSIS — N250 Renal osteodystrophy: Secondary | ICD-10-CM | POA: Diagnosis not present

## 2022-12-24 DIAGNOSIS — N186 End stage renal disease: Secondary | ICD-10-CM | POA: Diagnosis not present

## 2022-12-26 ENCOUNTER — Telehealth: Payer: Self-pay | Admitting: Pulmonary Disease

## 2022-12-26 DIAGNOSIS — Z87898 Personal history of other specified conditions: Secondary | ICD-10-CM | POA: Diagnosis not present

## 2022-12-26 DIAGNOSIS — G4733 Obstructive sleep apnea (adult) (pediatric): Secondary | ICD-10-CM

## 2022-12-26 NOTE — Telephone Encounter (Signed)
Call patient  Sleep study result  Date of study: 12/22/2022  Impression: Severe obstructive sleep apnea  Recommendation: DME referral  Recommend CPAP therapy for severe obstructive sleep apnea  Auto titrating CPAP with pressure settings of 5-20 will be appropriate  Encourage weight loss measures  Follow-up in the office 4 to 6 weeks following initiation of treatment

## 2022-12-26 NOTE — Procedures (Signed)
POLYSOMNOGRAPHY  Last, First: Tank, Sayani MRN: [id number] Gender: Male Age (years): 62 Weight (lbs): 204 DOB: 16-Sep-1960 BMI: 29 Primary Care: No PCP Epworth Score: 4 Referring: Tomma Lightning MD Technician: Cherylann Parr Interpreting: Tomma Lightning MD Study Type: NPSG Ordered Study Type: Split Night CPAP Study date: 12/22/2022 Location: [place] CLINICAL INFORMATION Anthony Bullock is a 62 year old Male and was referred to the sleep center for evaluation of G47.30 Sleep Apnea, Unspecified (780.57). Indications include Diabetes, Snoring, Witnesses Apnea / Gasping During Sleep.  MEDICATIONS Patient self administered medications include: N/A. Medications administered during study include No sleep medicine administered.  SLEEP STUDY TECHNIQUE A multi-channel overnight Polysomnography study was performed. The channels recorded and monitored were central and occipital EEG, electrooculogram (EOG), submentalis EMG (chin), nasal and oral airflow, thoracic and abdominal wall motion, anterior tibialis EMG, snore microphone, electrocardiogram, and a pulse oximetry. TECHNICIAN COMMENTS Comments added by Technician: Patient had difficulty initiating sleep. Comments added by Scorer: N/A SLEEP ARCHITECTURE The study was initiated at 10:44:18 PM and terminated at 4:50:24 AM. The total recorded time was 366.1 minutes. EEG confirmed total sleep time was 328.5 minutes yielding a sleep efficiency of 89.7%. Sleep onset after lights out was 13.0 minutes with a REM latency of 94.5 minutes. The patient spent 2.0% of the night in stage N1 sleep, 75.3% in stage N2 sleep, 0.2% in stage N3 and 22.5% in REM. Wake after sleep onset (WASO) was 24.6 minutes. The Arousal Index was 2.2/hour. RESPIRATORY PARAMETERS There were a total of 278 respiratory disturbances out of which 123 were apneas ( 61 obstructive, 49 mixed, 13 central) and 155 hypopneas. The apnea/hypopnea index (AHI) was 50.8 events/hour. The central  sleep apnea index was 2.4 events/hour. The REM AHI was 42.2 events/hour and NREM AHI was 53.3 events/hour. The supine AHI was 49.5 events/hour and the non supine AHI was 68.4 supine during 93.32% of sleep. Respiratory disturbances were associated with oxygen desaturation down to a nadir of 78.0% during sleep. The mean oxygen saturation during the study was 94.4%.   LEG MOVEMENT DATA The total leg movements were 41 with a resulting leg movement index of 7.5/hr .Associated arousal with leg movement index was 0.0/hr.  CARDIAC DATA The underlying cardiac rhythm was most consistent with sinus rhythm. Mean heart rate during sleep was 68.5 bpm. Additional rhythm abnormalities include None.  IMPRESSIONS - Severe Obstructive Sleep apnea(OSA) - EKG showed no cardiac abnormalities. - Severe Oxygen Desaturation - The patient snored with moderate snoring volume. - No significant periodic leg movements(PLMs) during sleep. However, no significant associated arousals.  DIAGNOSIS - Obstructive Sleep Apnea (G47.33) - Nocturnal Hypoxemia (G47.36)  RECOMMENDATIONS - Therapeutic CPAP titration to determine optimal pressure required to alleviate sleep disordered breathing. - A trial with auto CPAP 5-20 with heated humidification with patient's mask of choice may be an option of treatment. - Alcohol, sedatives, and other CNS depressants should be avoided as they can exacerbate sleep apnea and interfere with the natural architecture of sleep. - Sleep hygiene should be reviewed to assess factors that may improve sleep quality. - Weight management and regular exercise should be initiated or continued.   [Electronically signed] 12/26/2022 06:41 AM  Virl Diamond MD NPI: [id number]

## 2022-12-27 DIAGNOSIS — Z992 Dependence on renal dialysis: Secondary | ICD-10-CM | POA: Diagnosis not present

## 2022-12-27 DIAGNOSIS — N186 End stage renal disease: Secondary | ICD-10-CM | POA: Diagnosis not present

## 2022-12-27 DIAGNOSIS — N2581 Secondary hyperparathyroidism of renal origin: Secondary | ICD-10-CM | POA: Diagnosis not present

## 2022-12-27 DIAGNOSIS — N250 Renal osteodystrophy: Secondary | ICD-10-CM | POA: Diagnosis not present

## 2022-12-27 DIAGNOSIS — D509 Iron deficiency anemia, unspecified: Secondary | ICD-10-CM | POA: Diagnosis not present

## 2022-12-28 ENCOUNTER — Telehealth: Payer: Self-pay | Admitting: Pharmacy Technician

## 2022-12-28 NOTE — Telephone Encounter (Signed)
Pharmacy Patient Advocate Encounter  Received notification from [employer] that Prior Authorization for FreeStyle Libre 3 Plus Sensor has been DENIED.  See denial reason below. No denial letter attached in CMM. Will attach denial letter to Media tab once received.   I requested [employer] to submit claim to part b; however, they are receiving a rejection when they are running this under part b. The pharmacist said she will call their processor to try and fix the issue on their end.   PA #/Case ID/Reference #: [id number]

## 2022-12-29 ENCOUNTER — Ambulatory Visit: Payer: Medicare Other | Attending: Nurse Practitioner

## 2022-12-29 DIAGNOSIS — I739 Peripheral vascular disease, unspecified: Secondary | ICD-10-CM | POA: Diagnosis not present

## 2022-12-29 DIAGNOSIS — N2581 Secondary hyperparathyroidism of renal origin: Secondary | ICD-10-CM | POA: Diagnosis not present

## 2022-12-29 DIAGNOSIS — Z992 Dependence on renal dialysis: Secondary | ICD-10-CM | POA: Diagnosis not present

## 2022-12-29 DIAGNOSIS — I2510 Atherosclerotic heart disease of native coronary artery without angina pectoris: Secondary | ICD-10-CM

## 2022-12-29 DIAGNOSIS — N250 Renal osteodystrophy: Secondary | ICD-10-CM | POA: Diagnosis not present

## 2022-12-29 DIAGNOSIS — N186 End stage renal disease: Secondary | ICD-10-CM | POA: Diagnosis not present

## 2022-12-29 DIAGNOSIS — D509 Iron deficiency anemia, unspecified: Secondary | ICD-10-CM | POA: Diagnosis not present

## 2022-12-30 NOTE — Telephone Encounter (Signed)
Patient informed of denial. Patient reported he has been using Goodrx for this medication.

## 2022-12-31 DIAGNOSIS — N250 Renal osteodystrophy: Secondary | ICD-10-CM | POA: Diagnosis not present

## 2022-12-31 DIAGNOSIS — Z992 Dependence on renal dialysis: Secondary | ICD-10-CM | POA: Diagnosis not present

## 2022-12-31 DIAGNOSIS — N186 End stage renal disease: Secondary | ICD-10-CM | POA: Diagnosis not present

## 2022-12-31 DIAGNOSIS — D509 Iron deficiency anemia, unspecified: Secondary | ICD-10-CM | POA: Diagnosis not present

## 2022-12-31 DIAGNOSIS — N2581 Secondary hyperparathyroidism of renal origin: Secondary | ICD-10-CM | POA: Diagnosis not present

## 2023-01-02 DIAGNOSIS — N186 End stage renal disease: Secondary | ICD-10-CM | POA: Diagnosis not present

## 2023-01-02 DIAGNOSIS — D509 Iron deficiency anemia, unspecified: Secondary | ICD-10-CM | POA: Diagnosis not present

## 2023-01-02 DIAGNOSIS — N2581 Secondary hyperparathyroidism of renal origin: Secondary | ICD-10-CM | POA: Diagnosis not present

## 2023-01-02 DIAGNOSIS — Z992 Dependence on renal dialysis: Secondary | ICD-10-CM | POA: Diagnosis not present

## 2023-01-02 DIAGNOSIS — N250 Renal osteodystrophy: Secondary | ICD-10-CM | POA: Diagnosis not present

## 2023-01-02 NOTE — Telephone Encounter (Signed)
Called and spoke with pt, he verbalized understanding nfn. Cpap order placed

## 2023-01-05 DIAGNOSIS — N250 Renal osteodystrophy: Secondary | ICD-10-CM | POA: Diagnosis not present

## 2023-01-05 DIAGNOSIS — Z992 Dependence on renal dialysis: Secondary | ICD-10-CM | POA: Diagnosis not present

## 2023-01-05 DIAGNOSIS — N186 End stage renal disease: Secondary | ICD-10-CM | POA: Diagnosis not present

## 2023-01-05 DIAGNOSIS — N2581 Secondary hyperparathyroidism of renal origin: Secondary | ICD-10-CM | POA: Diagnosis not present

## 2023-01-05 DIAGNOSIS — D509 Iron deficiency anemia, unspecified: Secondary | ICD-10-CM | POA: Diagnosis not present

## 2023-01-07 DIAGNOSIS — E119 Type 2 diabetes mellitus without complications: Secondary | ICD-10-CM | POA: Diagnosis not present

## 2023-01-07 DIAGNOSIS — N186 End stage renal disease: Secondary | ICD-10-CM | POA: Diagnosis not present

## 2023-01-07 DIAGNOSIS — D509 Iron deficiency anemia, unspecified: Secondary | ICD-10-CM | POA: Diagnosis not present

## 2023-01-07 DIAGNOSIS — N250 Renal osteodystrophy: Secondary | ICD-10-CM | POA: Diagnosis not present

## 2023-01-07 DIAGNOSIS — N2581 Secondary hyperparathyroidism of renal origin: Secondary | ICD-10-CM | POA: Diagnosis not present

## 2023-01-07 DIAGNOSIS — Z992 Dependence on renal dialysis: Secondary | ICD-10-CM | POA: Diagnosis not present

## 2023-01-07 DIAGNOSIS — Z794 Long term (current) use of insulin: Secondary | ICD-10-CM | POA: Diagnosis not present

## 2023-01-10 DIAGNOSIS — Z992 Dependence on renal dialysis: Secondary | ICD-10-CM | POA: Diagnosis not present

## 2023-01-10 DIAGNOSIS — N2581 Secondary hyperparathyroidism of renal origin: Secondary | ICD-10-CM | POA: Diagnosis not present

## 2023-01-10 DIAGNOSIS — N186 End stage renal disease: Secondary | ICD-10-CM | POA: Diagnosis not present

## 2023-01-10 DIAGNOSIS — N250 Renal osteodystrophy: Secondary | ICD-10-CM | POA: Diagnosis not present

## 2023-01-10 DIAGNOSIS — D509 Iron deficiency anemia, unspecified: Secondary | ICD-10-CM | POA: Diagnosis not present

## 2023-01-12 DIAGNOSIS — N250 Renal osteodystrophy: Secondary | ICD-10-CM | POA: Diagnosis not present

## 2023-01-12 DIAGNOSIS — D509 Iron deficiency anemia, unspecified: Secondary | ICD-10-CM | POA: Diagnosis not present

## 2023-01-12 DIAGNOSIS — D631 Anemia in chronic kidney disease: Secondary | ICD-10-CM | POA: Diagnosis not present

## 2023-01-12 DIAGNOSIS — Z992 Dependence on renal dialysis: Secondary | ICD-10-CM | POA: Diagnosis not present

## 2023-01-12 DIAGNOSIS — N186 End stage renal disease: Secondary | ICD-10-CM | POA: Diagnosis not present

## 2023-01-12 DIAGNOSIS — N2581 Secondary hyperparathyroidism of renal origin: Secondary | ICD-10-CM | POA: Diagnosis not present

## 2023-01-14 DIAGNOSIS — N250 Renal osteodystrophy: Secondary | ICD-10-CM | POA: Diagnosis not present

## 2023-01-14 DIAGNOSIS — N2581 Secondary hyperparathyroidism of renal origin: Secondary | ICD-10-CM | POA: Diagnosis not present

## 2023-01-14 DIAGNOSIS — N186 End stage renal disease: Secondary | ICD-10-CM | POA: Diagnosis not present

## 2023-01-14 DIAGNOSIS — D509 Iron deficiency anemia, unspecified: Secondary | ICD-10-CM | POA: Diagnosis not present

## 2023-01-14 DIAGNOSIS — D631 Anemia in chronic kidney disease: Secondary | ICD-10-CM | POA: Diagnosis not present

## 2023-01-14 DIAGNOSIS — Z992 Dependence on renal dialysis: Secondary | ICD-10-CM | POA: Diagnosis not present

## 2023-01-15 ENCOUNTER — Other Ambulatory Visit: Payer: Self-pay | Admitting: Family Medicine

## 2023-01-17 DIAGNOSIS — Z992 Dependence on renal dialysis: Secondary | ICD-10-CM | POA: Diagnosis not present

## 2023-01-17 DIAGNOSIS — N186 End stage renal disease: Secondary | ICD-10-CM | POA: Diagnosis not present

## 2023-01-17 DIAGNOSIS — N250 Renal osteodystrophy: Secondary | ICD-10-CM | POA: Diagnosis not present

## 2023-01-17 DIAGNOSIS — D631 Anemia in chronic kidney disease: Secondary | ICD-10-CM | POA: Diagnosis not present

## 2023-01-17 DIAGNOSIS — N2581 Secondary hyperparathyroidism of renal origin: Secondary | ICD-10-CM | POA: Diagnosis not present

## 2023-01-17 DIAGNOSIS — D509 Iron deficiency anemia, unspecified: Secondary | ICD-10-CM | POA: Diagnosis not present

## 2023-01-19 DIAGNOSIS — N2581 Secondary hyperparathyroidism of renal origin: Secondary | ICD-10-CM | POA: Diagnosis not present

## 2023-01-19 DIAGNOSIS — D509 Iron deficiency anemia, unspecified: Secondary | ICD-10-CM | POA: Diagnosis not present

## 2023-01-19 DIAGNOSIS — N186 End stage renal disease: Secondary | ICD-10-CM | POA: Diagnosis not present

## 2023-01-19 DIAGNOSIS — D631 Anemia in chronic kidney disease: Secondary | ICD-10-CM | POA: Diagnosis not present

## 2023-01-19 DIAGNOSIS — N250 Renal osteodystrophy: Secondary | ICD-10-CM | POA: Diagnosis not present

## 2023-01-19 DIAGNOSIS — Z992 Dependence on renal dialysis: Secondary | ICD-10-CM | POA: Diagnosis not present

## 2023-01-20 DIAGNOSIS — N2581 Secondary hyperparathyroidism of renal origin: Secondary | ICD-10-CM | POA: Diagnosis not present

## 2023-01-20 DIAGNOSIS — N186 End stage renal disease: Secondary | ICD-10-CM | POA: Diagnosis not present

## 2023-01-20 DIAGNOSIS — D509 Iron deficiency anemia, unspecified: Secondary | ICD-10-CM | POA: Diagnosis not present

## 2023-01-20 DIAGNOSIS — Z992 Dependence on renal dialysis: Secondary | ICD-10-CM | POA: Diagnosis not present

## 2023-01-20 DIAGNOSIS — D631 Anemia in chronic kidney disease: Secondary | ICD-10-CM | POA: Diagnosis not present

## 2023-01-20 DIAGNOSIS — N250 Renal osteodystrophy: Secondary | ICD-10-CM | POA: Diagnosis not present

## 2023-01-23 NOTE — Telephone Encounter (Unsigned)
 Copied from CRM [id number]. Topic: Clinical - Prescription Issue >> Jan 23, 2023  8:30 AM Leotis ORN wrote: Reason for CRM: carvedilol  (COREG ) 12.5 MG tablet [Pharmacy Med Name: Carvedilol  12.5.- Rx status shows Pend 01/15/23 start date 01/17/23- pt spoke with pharmacy today and they stated it has not been received

## 2023-01-24 DIAGNOSIS — D509 Iron deficiency anemia, unspecified: Secondary | ICD-10-CM | POA: Diagnosis not present

## 2023-01-24 DIAGNOSIS — N250 Renal osteodystrophy: Secondary | ICD-10-CM | POA: Diagnosis not present

## 2023-01-24 DIAGNOSIS — D631 Anemia in chronic kidney disease: Secondary | ICD-10-CM | POA: Diagnosis not present

## 2023-01-24 DIAGNOSIS — Z992 Dependence on renal dialysis: Secondary | ICD-10-CM | POA: Diagnosis not present

## 2023-01-24 DIAGNOSIS — N186 End stage renal disease: Secondary | ICD-10-CM | POA: Diagnosis not present

## 2023-01-24 DIAGNOSIS — N2581 Secondary hyperparathyroidism of renal origin: Secondary | ICD-10-CM | POA: Diagnosis not present

## 2023-01-25 ENCOUNTER — Ambulatory Visit: Payer: Medicare Other | Admitting: Pulmonary Disease

## 2023-01-25 ENCOUNTER — Other Ambulatory Visit: Payer: Self-pay

## 2023-01-25 ENCOUNTER — Encounter: Payer: Self-pay | Admitting: Pulmonary Disease

## 2023-01-25 ENCOUNTER — Encounter (HOSPITAL_BASED_OUTPATIENT_CLINIC_OR_DEPARTMENT_OTHER): Payer: Medicare Other

## 2023-01-25 VITALS — BP 122/58 | HR 77 | Temp 98.5°F | Ht 70.0 in | Wt 206.8 lb

## 2023-01-25 DIAGNOSIS — G2581 Restless legs syndrome: Secondary | ICD-10-CM

## 2023-01-25 DIAGNOSIS — G4714 Hypersomnia due to medical condition: Secondary | ICD-10-CM

## 2023-01-25 DIAGNOSIS — G4733 Obstructive sleep apnea (adult) (pediatric): Secondary | ICD-10-CM

## 2023-01-25 MED ORDER — CARVEDILOL 12.5 MG PO TABS: 12.5000 mg | ORAL_TABLET | Freq: Two times a day (BID) | ORAL | 3 refills | Status: DC

## 2023-01-25 NOTE — Telephone Encounter (Signed)
 Copied from CRM [id number]. Topic: Clinical - Medication Question >> Jan 25, 2023 10:03 AM Karole Pacer C wrote: Reason for CRM: Patient is needing a refill sent to the pharmacy for the following medication:carvedilol  (COREG ) 12.5 MG tablet  Patient was advised no more refills are available and an appointment would need to be scheduled. He stated the provider never request an appointment for refills. He would like a call back.

## 2023-01-25 NOTE — Patient Instructions (Signed)
 I will see you in about 3 months  Start your CPAP once you get it  Call us  with significant concerns  Stay active as best as you can  Will follow-up with a breathing study Becoming

## 2023-01-25 NOTE — Addendum Note (Signed)
 Addended by: Aurelio Leer on: 01/25/2023 10:41 AM   Modules accepted: Orders

## 2023-01-25 NOTE — Progress Notes (Signed)
 Anthony Bullock    [id number]    December 13, 1960  Primary Care Physician:Burchette, Marijean Shouts, MD  Referring Physician: Marquetta Sit, MD [address] [place],  [place] [address]  Chief complaint:   Patient being seen for snoring, witnessed apneas History of restless legs  HPI:  Concern for sleep disordered breathing Shortness of breath laying flat Restless legs Usually goes to bed about 1230 Takes about 45 minutes to fall asleep 2-3 awakenings Wake up time varies on days of dialysis gets up about 4, days without dialysis between 930 and 10 AM He is disabled  Had a sleep study done showing severe obstructive sleep apnea -Will be started on auto CPAP -Waiting to pick up machine  Breathing has been relatively stable  Reformed smoker  Admits to dryness of his mouth in the morning No morning headaches Recently diagnosed with A-fib in the last couple of months He has a history of hypertension, coronary artery disease, heart failure, diabetes, hypercholesterolemia, blood clots  Has been on dialysis for about 7 years  Quit smoking about 13 years ago   Outpatient Encounter Medications as of 01/25/2023  Medication Sig   acetaminophen  (TYLENOL ) 325 MG tablet Take 1-2 tablets (325-650 mg total) by mouth every 4 (four) hours as needed for mild pain.   apixaban  (ELIQUIS ) 5 MG TABS tablet Take 1 tablet (5 mg total) by mouth 2 (two) times daily.   atorvastatin  (LIPITOR ) 80 MG tablet TAKE 1 TABLET BY MOUTH AT BEDTIME (Patient taking differently: Take 80 mg by mouth daily.)   azelastine  (ASTELIN ) 0.1 % nasal spray Place 2 sprays into both nostrils 3 (three) times daily as needed for rhinitis. Use in each nostril as directed   carvedilol  (COREG ) 12.5 MG tablet Take 1 tablet (12.5 mg total) by mouth 2 (two) times daily.   cinacalcet  (SENSIPAR ) 30 MG tablet Take 1 tablet (30 mg total) by mouth Every Tuesday,Thursday,and Saturday with dialysis.   Continuous Glucose Receiver  (FREESTYLE LIBRE 3 READER) DEVI Use to check blood glucose TID   Continuous Glucose Sensor (FREESTYLE LIBRE 3 PLUS SENSOR) MISC Change sensor every 15 days. Use as directed to check glucose daily   fenofibrate  160 MG tablet TAKE 1 TABLET BY MOUTH ONCE DAILY (Patient taking differently: Take 160 mg by mouth daily.)   fexofenadine (ALLEGRA) 180 MG tablet Take 180 mg by mouth daily as needed for allergies.   gabapentin  (NEURONTIN ) 300 MG capsule TAKE 2 CAPSULES BY MOUTH TWICE DAILY . (Patient taking differently: Take 600 mg by mouth 2 (two) times daily.)   glucose blood test strip Check 1 time daily. E11.9 One Touch Ultra Blue Test Strips   HUMALOG  KWIKPEN 200 UNIT/ML KwikPen INJECT A MAXIMUM OF 28 UNITS SUBCUTANEOUSLY TWICE DAILY WITH LUNCH AND SUPPER PER SLIDING SCALE. APPOINTMENT REQUIRED FOR FUTURE REFILLS   Insulin  Pen Needle (BD PEN NEEDLE NANO U/F) 32G X 4 MM MISC USE 1 PEN NEEDLE SUBCUTANEOUSLY WITH INSULIN  4 TIMES DAILY   lanthanum  (FOSRENOL ) 500 MG chewable tablet Chew 500-1,000 mg by mouth See admin instructions. Take 1000 mg with meals three time a day and 500 mg with snacks   LANTUS  SOLOSTAR 100 UNIT/ML Solostar Pen INJECT 12 UNITS SUBCUTANEOUSLY AT BEDTIME (Patient taking differently: Inject 14 Units into the skin at bedtime.)   midodrine  (PROAMATINE ) 10 MG tablet Take 10 mg by mouth 3 (three) times a week. EVERY TUESDAY, THURSDAY, AND SATURDAY   multivitamin (RENA-VIT) TABS tablet Take 1  tablet by mouth once daily (Patient taking differently: Take 1 tablet by mouth daily.)   Olopatadine  HCl 0.2 % SOLN Place 1 drop into both eyes daily as needed (for allergies).    tobramycin  (TOBREX ) 0.3 % ophthalmic solution Place 1 drop into the right eye every 6 (six) hours.   VENTOLIN  HFA 108 (90 Base) MCG/ACT inhaler Inhale 1-2 puffs into the lungs every 6 (six) hours as needed for wheezing or shortness of breath.   No facility-administered encounter medications on file as of 01/25/2023.     Allergies as of 01/25/2023 - Review Complete 01/25/2023  Allergen Reaction Noted   Epoetin  alfa Other (See Comments) 07/09/2019   Ferumoxytol  Other (See Comments) 07/09/2019   Morphine sulfate Rash and Other (See Comments) 05/07/2008    Past Medical History:  Diagnosis Date   AICD (automatic cardioverter/defibrillator) present    [employer]   Allergic rhinitis    Anemia    Arthritis    Chronic systolic heart failure (HCC)    a. ECHO (12/2012) EF 25-30%, HK entireanteroseptal myocardium //  b.  EF 25%, diffuse HK, grade 1 diastolic dysfunction, MAC, mild LAE, normal RVSF, trivial pericardial effusion   COPD (chronic obstructive pulmonary disease) (HCC)    Diabetes mellitus type II    Diabetic nephropathy (HCC)    Diabetic neuropathy (HCC)    ESRD on hemodialysis (HCC)    started HD June 2017, goes to [facility] HD unit, Dr Lieutenant Reese   History of cardiac catheterization    a.Myoview  1/15:  There is significant left ventricular dysfunction. There may be slight scar at the apex. There is no significant ischemia. LV Ejection Fraction: 27%  //  b. RHC/LHC (1/15) with mean RA 6, PA 47/22 mean 33, mean PCWP 20, PVR 2.5 WU, CI 2.5; 80% dLAD stenosis, 70% diffuse large D.     History of kidney stones    Hyperlipidemia    Hypertension    Kidney stones    NICM (nonischemic cardiomyopathy) (HCC)    Primarily nonischemic.  Echo (12/14) with EF 25-30%.  Echo (3/15) with EF 25%, mild to moderately dilated LV, normal RV size and systolic function.     Osteomyelitis (HCC)    left fifth ray   Pneumonia    Urethral stricture    Wears glasses     Past Surgical History:  Procedure Laterality Date   ABDOMINAL AORTOGRAM W/LOWER EXTREMITY N/A 03/30/2016   Procedure: Abdominal Aortogram w/Lower Extremity;  Surgeon: Dannis Dy, MD;  Location[facility] INVASIVE CV LAB;  Service: Cardiovascular;  Laterality: N/A;   AMPUTATION Right 04/26/2016   Procedure: Right Below Knee Amputation;   Surgeon: Timothy Ford, MD;  Location: [facility] OR;  Service: Orthopedics;  Laterality: Right;   AMPUTATION Left 08/21/2019   Procedure: LEFT FOOT 5TH RAY AMPUTATION;  Surgeon: Timothy Ford, MD;  Location: [facility] OR;  Service: Orthopedics;  Laterality: Left;   AMPUTATION Left 11/13/2019   Procedure: LEFT BELOW KNEE AMPUTATION;  Surgeon: Timothy Ford, MD;  Location: [facility] OR;  Service: Orthopedics;  Laterality: Left;   AV FISTULA PLACEMENT Right 09/08/2015   Procedure: INSERTION OF 4-22mm x 45cm  ARTERIOVENOUS (AV) GORE-TEX GRAFT RIGHT UPPER  ARM;  Surgeon: Dannis Dy, MD;  Location: [facility] OR;  Service: Vascular;  Laterality: Right;   AV FISTULA PLACEMENT Left 01/14/2016   Procedure: CREATION OF LEFT UPPER ARM ARTERIOVENOUS FISTULA;  Surgeon: Dannis Dy, MD;  Location: [facility] OR;  Service: Vascular;  Laterality: Left;  BASCILIC VEIN TRANSPOSITION Right 08/22/2014   Procedure: RIGHT UPPER ARM BASCILIC VEIN TRANSPOSITION;  Surgeon: Dannis Dy, MD;  Location: [facility];  Service: Vascular;  Laterality: Right;   BELOW KNEE LEG AMPUTATION Right 04/26/2016   CARDIAC CATHETERIZATION     CARDIAC DEFIBRILLATOR PLACEMENT  06/27/2013   Sub Q       BY DR Rodolfo Clan   CATARACT EXTRACTION W/PHACO Right 08/06/2018   Procedure: CATARACT EXTRACTION PHACO AND INTRAOCULAR LENS PLACEMENT (IOC);  Surgeon: Tarri Farm, MD;  Location: [facility] ORS;  Service: Ophthalmology;  Laterality: Right;  CDE: 4.06   CATARACT EXTRACTION W/PHACO Left 08/20/2018   Procedure: CATARACT EXTRACTION PHACO AND INTRAOCULAR LENS PLACEMENT (IOC);  Surgeon: Tarri Farm, MD;  Location: [facility] ORS;  Service: Ophthalmology;  Laterality: Left;  CDE: 6.76   COLONOSCOPY WITH PROPOFOL  N/A 07/22/2015   Procedure: COLONOSCOPY WITH PROPOFOL ;  Surgeon: Albertina Hugger, MD;  Location: [facility] ENDOSCOPY;  Service: Gastroenterology;  Laterality: N/A;   FEMORAL-POPLITEAL BYPASS GRAFT Right 03/31/2016   Procedure: BYPASS GRAFT FEMORAL-POPLITEAL ARTERY USING RIGHT  GREATER SAPHENOUS NONREVERSED VEIN;  Surgeon: Dannis Dy, MD;  Location: [facility] OR;  Service: Vascular;  Laterality: Right;   HERNIA REPAIR     I & D EXTREMITY Right 03/31/2016   Procedure: IRRIGATION AND DEBRIDEMENT FOOT;  Surgeon: Dannis Dy, MD;  Location: [facility] OR;  Service: Vascular;  Laterality: Right;   IMPLANTABLE CARDIOVERTER DEFIBRILLATOR IMPLANT N/A 06/27/2013   Procedure: SUB Q ICD;  Surgeon: Verona Goodwill, MD;  Location: [facility] CATH LAB;  Service: Cardiovascular;  Laterality: N/A;   INTRAOPERATIVE ARTERIOGRAM Right 03/31/2016   Procedure: INTRA OPERATIVE ARTERIOGRAM;  Surgeon: Dannis Dy, MD;  Location: [facility] OR;  Service: Vascular;  Laterality: Right;   IR GENERIC HISTORICAL Right 11/30/2015   IR THROMBECTOMY AV FISTULA W/THROMBOLYSIS/PTA INC/SHUNT/IMG RIGHT 11/30/2015 Erica Hau, MD [facility]   IR GENERIC HISTORICAL  11/30/2015   IR US  GUIDE VASC ACCESS RIGHT 11/30/2015 Erica Hau, MD [facility]   IR GENERIC HISTORICAL Right 12/15/2015   IR THROMBECTOMY AV FISTULA W/THROMBOLYSIS/PTA/STENT INC/SHUNT/IMG RT 12/15/2015 Marland Silvas, MD [facility]   IR GENERIC HISTORICAL  12/15/2015   IR US  GUIDE VASC ACCESS RIGHT 12/15/2015 Marland Silvas, MD [facility]   IR GENERIC HISTORICAL  12/28/2015   IR FLUORO GUIDE CV LINE RIGHT 12/28/2015 Artice Last, MD [facility]   IR GENERIC HISTORICAL  12/28/2015   IR US  GUIDE VASC ACCESS RIGHT 12/28/2015 Artice Last, MD [facility]   LEFT A ND RIGHT HEART CATH  01/30/2013   DR Dasie Epps   LEFT AND RIGHT HEART CATHETERIZATION WITH CORONARY ANGIOGRAM N/A 01/30/2013   Procedure: LEFT AND RIGHT HEART CATHETERIZATION WITH CORONARY ANGIOGRAM;  Surgeon: Mardell Shade, MD;  Location: [facility] CATH LAB;  Service: Cardiovascular;  Laterality: N/A;   PERIPHERAL VASCULAR CATHETERIZATION Right 01/26/2015   Procedure: A/V Fistulagram;  Surgeon: Dannis Dy, MD;  Location: [facility] INVASIVE CV LAB;  Service: Cardiovascular;   Laterality: Right;   reapea urethral surgery for recurrent obstruction  2011   TOTAL KNEE ARTHROPLASTY Right 2007   VEIN HARVEST Right 03/31/2016   Procedure: RIGHT GREATER SAPHENOUS VEIN HARVEST;  Surgeon: Dannis Dy, MD;  Location: [facility] OR;  Service: Vascular;  Laterality: Right;    Family History  Problem Relation Age of Onset   Bladder Cancer Mother    Alcohol  abuse Father    Melanoma Father    Stroke Maternal Grandmother    Heart Problems Maternal Grandmother  unknown   Diabetes Maternal Grandmother    Heart disease Maternal Grandfather    Prostate cancer Maternal Grandfather     Social History   Socioeconomic History   Marital status: Married    Spouse name: Not on file   Number of children: 0   Years of education: Not on file   Highest education level: Not on file  Occupational History   Not on file  Tobacco Use   Smoking status: Former    Current packs/day: 0.00    Average packs/day: 2.0 packs/day for 32.0 years (64.0 ttl pk-yrs)    Types: Cigarettes    Start date: 05/11/1978    Quit date: 05/11/2010    Years since quitting: 12.7   Smokeless tobacco: Never  Vaping Use   Vaping status: Never Used  Substance and Sexual Activity   Alcohol  use: No   Drug use: No   Sexual activity: Yes  Other Topics Concern   Not on file  Social History Narrative   Works at [employer] as a Therapist, music      Right handed   Wears glasses    Drinks 10 oz coffee daily   Drinks 2 sodas per day   Social Drivers of Health   Financial Resource Strain: Low Risk  (01/24/2022)   Overall Financial Resource Strain (CARDIA)    Difficulty of Paying Living Expenses: Not hard at all  Food Insecurity: No Food Insecurity (09/13/2022)   Hunger Vital Sign    Worried About Running Out of Food in the Last Year: Never true    Ran Out of Food in the Last Year: Never true  Transportation Needs: No Transportation Needs (09/13/2022)   PRAPARE - Administrator, Civil Service  (Medical): No    Lack of Transportation (Non-Medical): No  Physical Activity: Inactive (01/24/2022)   Exercise Vital Sign    Days of Exercise per Week: 0 days    Minutes of Exercise per Session: 0 min  Stress: No Stress Concern Present (01/24/2022)   [employer] of Occupational Health - Occupational Stress Questionnaire    Feeling of Stress : Not at all  Social Connections: Socially Integrated (01/24/2022)   Social Connection and Isolation Panel [NHANES]    Frequency of Communication with Friends and Family: More than three times a week    Frequency of Social Gatherings with Friends and Family: More than three times a week    Attends Religious Services: More than 4 times per year    Active Member of [employer] or Organizations: Yes    Attends Banker Meetings: More than 4 times per year    Marital Status: Married  Catering manager Violence: Not At Risk (09/06/2022)   Humiliation, Afraid, Rape, and Kick questionnaire    Fear of Current or Ex-Partner: No    Emotionally Abused: No    Physically Abused: No    Sexually Abused: No    Review of Systems  Constitutional:  Negative for fever.  Respiratory:  Positive for apnea and shortness of breath.   Psychiatric/Behavioral:  Positive for sleep disturbance.     Vitals:   01/25/23 1348  BP: (!) 122/58  Pulse: 77  Temp: 98.5 F (36.9 C)  SpO2: 98%     Physical Exam Constitutional:      Appearance: Normal appearance.  HENT:     Head: Normocephalic.     Mouth/Throat:     Mouth: Mucous membranes are moist.  Eyes:     General: No scleral  icterus. Cardiovascular:     Rate and Rhythm: Normal rate and regular rhythm.     Heart sounds: No murmur heard. Pulmonary:     Effort: No respiratory distress.     Breath sounds: No stridor. No wheezing or rhonchi.  Musculoskeletal:     Cervical back: No rigidity or tenderness.  Neurological:     Mental Status: He is alert.  Psychiatric:        Mood and Affect: Mood normal.        10/19/2022    1:00 PM  Results of the Epworth flowsheet  Sitting and reading 3  Watching TV 3  Sitting, inactive in a public place (e.g. a theatre or a meeting) 2  As a passenger in a car for an hour without a break 2  Lying down to rest in the afternoon when circumstances permit 2  Sitting and talking to someone 0  Sitting quietly after a lunch without alcohol  2  In a car, while stopped for a few minutes in traffic 0  Total score 14     Data Reviewed: No previous sleep studies available  No recent PFTs  Sleep study reviewed with the patient showing severe obstructive sleep apnea  Assessment:  Severe obstructive sleep apnea -Waiting to pick up his CPAP  Excessive daytime sleepiness  Restless legs -Currently on gabapentin  -Appears to be controlling symptoms at present  End-stage renal disease on dialysis  Atrial fibrillation -Controlled at present   discussed with the patient Treatment options discussed with the patient  Plan/Recommendations: Encouraged to start CPAP once he gets his machine  No other changes made at present  Follow-up on PFT when he is able to get it done  Follow-up in about 3 months   Myer Artis MD [facility] 01/25/2023, 2:09 PM  CC: Marquetta Sit, MD

## 2023-01-26 DIAGNOSIS — N250 Renal osteodystrophy: Secondary | ICD-10-CM | POA: Diagnosis not present

## 2023-01-26 DIAGNOSIS — Z992 Dependence on renal dialysis: Secondary | ICD-10-CM | POA: Diagnosis not present

## 2023-01-26 DIAGNOSIS — N2581 Secondary hyperparathyroidism of renal origin: Secondary | ICD-10-CM | POA: Diagnosis not present

## 2023-01-26 DIAGNOSIS — D631 Anemia in chronic kidney disease: Secondary | ICD-10-CM | POA: Diagnosis not present

## 2023-01-26 DIAGNOSIS — N186 End stage renal disease: Secondary | ICD-10-CM | POA: Diagnosis not present

## 2023-01-26 DIAGNOSIS — D509 Iron deficiency anemia, unspecified: Secondary | ICD-10-CM | POA: Diagnosis not present

## 2023-01-28 DIAGNOSIS — N2581 Secondary hyperparathyroidism of renal origin: Secondary | ICD-10-CM | POA: Diagnosis not present

## 2023-01-28 DIAGNOSIS — D631 Anemia in chronic kidney disease: Secondary | ICD-10-CM | POA: Diagnosis not present

## 2023-01-28 DIAGNOSIS — D509 Iron deficiency anemia, unspecified: Secondary | ICD-10-CM | POA: Diagnosis not present

## 2023-01-28 DIAGNOSIS — N186 End stage renal disease: Secondary | ICD-10-CM | POA: Diagnosis not present

## 2023-01-28 DIAGNOSIS — Z992 Dependence on renal dialysis: Secondary | ICD-10-CM | POA: Diagnosis not present

## 2023-01-28 DIAGNOSIS — N250 Renal osteodystrophy: Secondary | ICD-10-CM | POA: Diagnosis not present

## 2023-01-31 DIAGNOSIS — N186 End stage renal disease: Secondary | ICD-10-CM | POA: Diagnosis not present

## 2023-01-31 DIAGNOSIS — D631 Anemia in chronic kidney disease: Secondary | ICD-10-CM | POA: Diagnosis not present

## 2023-01-31 DIAGNOSIS — D509 Iron deficiency anemia, unspecified: Secondary | ICD-10-CM | POA: Diagnosis not present

## 2023-01-31 DIAGNOSIS — N2581 Secondary hyperparathyroidism of renal origin: Secondary | ICD-10-CM | POA: Diagnosis not present

## 2023-01-31 DIAGNOSIS — N250 Renal osteodystrophy: Secondary | ICD-10-CM | POA: Diagnosis not present

## 2023-01-31 DIAGNOSIS — Z992 Dependence on renal dialysis: Secondary | ICD-10-CM | POA: Diagnosis not present

## 2023-02-01 ENCOUNTER — Ambulatory Visit: Payer: Medicare Other | Admitting: Pulmonary Disease

## 2023-02-01 DIAGNOSIS — D631 Anemia in chronic kidney disease: Secondary | ICD-10-CM | POA: Diagnosis not present

## 2023-02-01 DIAGNOSIS — N186 End stage renal disease: Secondary | ICD-10-CM | POA: Diagnosis not present

## 2023-02-01 DIAGNOSIS — N250 Renal osteodystrophy: Secondary | ICD-10-CM | POA: Diagnosis not present

## 2023-02-01 DIAGNOSIS — N2581 Secondary hyperparathyroidism of renal origin: Secondary | ICD-10-CM | POA: Diagnosis not present

## 2023-02-01 DIAGNOSIS — J449 Chronic obstructive pulmonary disease, unspecified: Secondary | ICD-10-CM

## 2023-02-01 DIAGNOSIS — D509 Iron deficiency anemia, unspecified: Secondary | ICD-10-CM | POA: Diagnosis not present

## 2023-02-01 DIAGNOSIS — Z992 Dependence on renal dialysis: Secondary | ICD-10-CM | POA: Diagnosis not present

## 2023-02-01 LAB — PULMONARY FUNCTION TEST
DL/VA % pred: 58 %
DL/VA: 2.48 ml/min/mmHg/L
DLCO cor % pred: 45 %
DLCO cor: 12.48 ml/min/mmHg
DLCO unc % pred: 45 %
DLCO unc: 12.48 ml/min/mmHg
FEF 25-75 Post: 2.59 L/s
FEF 25-75 Pre: 1.8 L/s
FEF2575-%Change-Post: 43 %
FEF2575-%Pred-Post: 88 %
FEF2575-%Pred-Pre: 61 %
FEV1-%Change-Post: 7 %
FEV1-%Pred-Post: 73 %
FEV1-%Pred-Pre: 68 %
FEV1-Post: 2.63 L
FEV1-Pre: 2.46 L
FEV1FVC-%Change-Post: 4 %
FEV1FVC-%Pred-Pre: 99 %
FEV6-%Change-Post: 4 %
FEV6-%Pred-Post: 74 %
FEV6-%Pred-Pre: 71 %
FEV6-Post: 3.38 L
FEV6-Pre: 3.25 L
FEV6FVC-%Change-Post: 1 %
FEV6FVC-%Pred-Post: 104 %
FEV6FVC-%Pred-Pre: 103 %
FVC-%Change-Post: 2 %
FVC-%Pred-Post: 71 %
FVC-%Pred-Pre: 69 %
FVC-Post: 3.38 L
FVC-Pre: 3.29 L
Post FEV1/FVC ratio: 78 %
Post FEV6/FVC ratio: 100 %
Pre FEV1/FVC ratio: 75 %
Pre FEV6/FVC Ratio: 99 %
RV % pred: 78 %
RV: 1.78 L
TLC % pred: 73 %
TLC: 5.16 L

## 2023-02-01 NOTE — Progress Notes (Signed)
Full PFT performed today. °

## 2023-02-01 NOTE — Patient Instructions (Signed)
Full PFT performed today. °

## 2023-02-02 DIAGNOSIS — D509 Iron deficiency anemia, unspecified: Secondary | ICD-10-CM | POA: Diagnosis not present

## 2023-02-02 DIAGNOSIS — N186 End stage renal disease: Secondary | ICD-10-CM | POA: Diagnosis not present

## 2023-02-02 DIAGNOSIS — N250 Renal osteodystrophy: Secondary | ICD-10-CM | POA: Diagnosis not present

## 2023-02-02 DIAGNOSIS — N2581 Secondary hyperparathyroidism of renal origin: Secondary | ICD-10-CM | POA: Diagnosis not present

## 2023-02-02 DIAGNOSIS — Z992 Dependence on renal dialysis: Secondary | ICD-10-CM | POA: Diagnosis not present

## 2023-02-02 DIAGNOSIS — D631 Anemia in chronic kidney disease: Secondary | ICD-10-CM | POA: Diagnosis not present

## 2023-02-03 ENCOUNTER — Ambulatory Visit (INDEPENDENT_AMBULATORY_CARE_PROVIDER_SITE_OTHER): Payer: Medicare Other

## 2023-02-03 VITALS — Ht 70.0 in | Wt 206.0 lb

## 2023-02-03 DIAGNOSIS — Z0000 Encounter for general adult medical examination without abnormal findings: Secondary | ICD-10-CM

## 2023-02-03 NOTE — Patient Instructions (Addendum)
Mr. Alleva , Thank you for taking time to come for your Medicare Wellness Visit. I appreciate your ongoing commitment to your health goals. Please review the following plan we discussed and let me know if I can assist you in the future.   Referrals/Orders/Follow-Ups/Clinician Recommendations:   This is a list of the screening recommended for you and due dates:  Health Maintenance  Topic Date Due   Screening for Lung Cancer  Never done   Zoster (Shingles) Vaccine (1 of 2) Never done   DTaP/Tdap/Td vaccine (2 - Tdap) 10/04/2018   Pneumococcal Vaccination (3 of 3 - PPSV23 or PCV20) 09/22/2020   COVID-19 Vaccine (3 - 2024-25 season) 09/11/2022   Eye exam for diabetics  04/27/2023   Hemoglobin A1C  06/21/2023   Medicare Annual Wellness Visit  02/03/2024   Colon Cancer Screening  07/21/2025   Flu Shot  Completed   Hepatitis C Screening  Completed   HIV Screening  Completed   HPV Vaccine  Aged Out   Complete foot exam   Discontinued    Advanced directives: (Declined) Advance directive discussed with you today. Even though you declined this today, please call our office should you change your mind, and we can give you the proper paperwork for you to fill out.  Next Medicare Annual Wellness Visit scheduled for next year: Yes

## 2023-02-03 NOTE — Progress Notes (Signed)
Subjective:   Anthony Bullock is a 63 y.o. male who presents for Medicare Annual/Subsequent preventive examination.  Visit Complete: Virtual I connected with  Avelino Leeds on 02/03/23 by a audio enabled telemedicine application and verified that I am speaking with the correct person using two identifiers.  Patient Location: Home  Provider Location: Home Office  I discussed the limitations of evaluation and management by telemedicine. The patient expressed understanding and agreed to proceed.  Vital Signs: Because this visit was a virtual/telehealth visit, some criteria may be missing or patient reported. Any vitals not documented were not able to be obtained and vitals that have been documented are patient reported.    Cardiac Risk Factors include: advanced age (>15men, >67 women);diabetes mellitus;male gender;hypertension     Objective:    Today's Vitals   02/03/23 1426  Weight: 206 lb (93.4 kg)  Height: 5\' 10"  (1.778 m)   Body mass index is 29.56 kg/m.     02/03/2023    2:36 PM 09/24/2022    5:38 AM 09/06/2022   10:44 AM 09/06/2022   12:55 AM 08/21/2022    9:00 PM 08/05/2022    5:45 PM 08/04/2022    4:37 PM  Advanced Directives  Does Patient Have a Medical Advance Directive? No No No No No No No  Would patient like information on creating a medical advance directive? No - Patient declined  No - Patient declined  No - Patient declined  No - Patient declined    Current Medications (verified) Outpatient Encounter Medications as of 02/03/2023  Medication Sig   acetaminophen (TYLENOL) 325 MG tablet Take 1-2 tablets (325-650 mg total) by mouth every 4 (four) hours as needed for mild pain.   apixaban (ELIQUIS) 5 MG TABS tablet Take 1 tablet (5 mg total) by mouth 2 (two) times daily.   atorvastatin (LIPITOR) 80 MG tablet TAKE 1 TABLET BY MOUTH AT BEDTIME (Patient taking differently: Take 80 mg by mouth daily.)   azelastine (ASTELIN) 0.1 % nasal spray Place 2 sprays into both  nostrils 3 (three) times daily as needed for rhinitis. Use in each nostril as directed   carvedilol (COREG) 12.5 MG tablet Take 1 tablet (12.5 mg total) by mouth 2 (two) times daily.   cinacalcet (SENSIPAR) 30 MG tablet Take 1 tablet (30 mg total) by mouth Every Tuesday,Thursday,and Saturday with dialysis.   Continuous Glucose Receiver (FREESTYLE LIBRE 3 READER) DEVI Use to check blood glucose TID   Continuous Glucose Sensor (FREESTYLE LIBRE 3 PLUS SENSOR) MISC Change sensor every 15 days. Use as directed to check glucose daily   fenofibrate 160 MG tablet TAKE 1 TABLET BY MOUTH ONCE DAILY (Patient taking differently: Take 160 mg by mouth daily.)   fexofenadine (ALLEGRA) 180 MG tablet Take 180 mg by mouth daily as needed for allergies.   gabapentin (NEURONTIN) 300 MG capsule TAKE 2 CAPSULES BY MOUTH TWICE DAILY . (Patient taking differently: Take 600 mg by mouth 2 (two) times daily.)   glucose blood test strip Check 1 time daily. E11.9 One Touch Ultra Blue Test Strips   HUMALOG KWIKPEN 200 UNIT/ML KwikPen INJECT A MAXIMUM OF 28 UNITS SUBCUTANEOUSLY TWICE DAILY WITH LUNCH AND SUPPER PER SLIDING SCALE. APPOINTMENT REQUIRED FOR FUTURE REFILLS   Insulin Pen Needle (BD PEN NEEDLE NANO U/F) 32G X 4 MM MISC USE 1 PEN NEEDLE SUBCUTANEOUSLY WITH INSULIN 4 TIMES DAILY   lanthanum (FOSRENOL) 500 MG chewable tablet Chew 500-1,000 mg by mouth See admin instructions. Take 1000 mg  with meals three time a day and 500 mg with snacks   LANTUS SOLOSTAR 100 UNIT/ML Solostar Pen INJECT 12 UNITS SUBCUTANEOUSLY AT BEDTIME (Patient taking differently: Inject 14 Units into the skin at bedtime.)   midodrine (PROAMATINE) 10 MG tablet Take 10 mg by mouth 3 (three) times a week. EVERY TUESDAY, THURSDAY, AND SATURDAY   multivitamin (RENA-VIT) TABS tablet Take 1 tablet by mouth once daily (Patient taking differently: Take 1 tablet by mouth daily.)   Olopatadine HCl 0.2 % SOLN Place 1 drop into both eyes daily as needed (for  allergies).    tobramycin (TOBREX) 0.3 % ophthalmic solution Place 1 drop into the right eye every 6 (six) hours.   VENTOLIN HFA 108 (90 Base) MCG/ACT inhaler Inhale 1-2 puffs into the lungs every 6 (six) hours as needed for wheezing or shortness of breath.   No facility-administered encounter medications on file as of 02/03/2023.    Allergies (verified) Epoetin alfa, Ferumoxytol, and Morphine sulfate   History: Past Medical History:  Diagnosis Date   AICD (automatic cardioverter/defibrillator) present    [employer]   Allergic rhinitis    Anemia    Arthritis    Chronic systolic heart failure (HCC)    a. ECHO (12/2012) EF 25-30%, HK entireanteroseptal myocardium //  b.  EF 25%, diffuse HK, grade 1 diastolic dysfunction, MAC, mild LAE, normal RVSF, trivial pericardial effusion   COPD (chronic obstructive pulmonary disease) (HCC)    Diabetes mellitus type II    Diabetic nephropathy (HCC)    Diabetic neuropathy (HCC)    ESRD on hemodialysis (HCC)    started HD June 2017, goes to [facility] HD unit, Dr Fausto Skillern   History of cardiac catheterization    a.Myoview 1/15:  There is significant left ventricular dysfunction. There may be slight scar at the apex. There is no significant ischemia. LV Ejection Fraction: 27%  //  b. RHC/LHC (1/15) with mean RA 6, PA 47/22 mean 33, mean PCWP 20, PVR 2.5 WU, CI 2.5; 80% dLAD stenosis, 70% diffuse large D.     History of kidney stones    Hyperlipidemia    Hypertension    Kidney stones    NICM (nonischemic cardiomyopathy) (HCC)    Primarily nonischemic.  Echo (12/14) with EF 25-30%.  Echo (3/15) with EF 25%, mild to moderately dilated LV, normal RV size and systolic function.     Osteomyelitis (HCC)    left fifth ray   Pneumonia    Urethral stricture    Wears glasses    Past Surgical History:  Procedure Laterality Date   ABDOMINAL AORTOGRAM W/LOWER EXTREMITY N/A 03/30/2016   Procedure: Abdominal Aortogram w/Lower Extremity;  Surgeon:  Chuck Hint, MD;  Location: [facility] INVASIVE CV LAB;  Service: Cardiovascular;  Laterality: N/A;   AMPUTATION Right 04/26/2016   Procedure: Right Below Knee Amputation;  Surgeon: Nadara Mustard, MD;  Location: [facility] OR;  Service: Orthopedics;  Laterality: Right;   AMPUTATION Left 08/21/2019   Procedure: LEFT FOOT 5TH RAY AMPUTATION;  Surgeon: Nadara Mustard, MD;  Location: [facility] OR;  Service: Orthopedics;  Laterality: Left;   AMPUTATION Left 11/13/2019   Procedure: LEFT BELOW KNEE AMPUTATION;  Surgeon: Nadara Mustard, MD;  Location: [facility] OR;  Service: Orthopedics;  Laterality: Left;   AV FISTULA PLACEMENT Right 09/08/2015   Procedure: INSERTION OF 4-2mm x 45cm  ARTERIOVENOUS (AV) GORE-TEX GRAFT RIGHT UPPER  ARM;  Surgeon: Chuck Hint, MD;  Location: [facility] OR;  Service: Vascular;  Laterality: Right;   AV FISTULA PLACEMENT Left 01/14/2016   Procedure: CREATION OF LEFT UPPER ARM ARTERIOVENOUS FISTULA;  Surgeon: Chuck Hint, MD;  Location: [facility] OR;  Service: Vascular;  Laterality: Left;   BASCILIC VEIN TRANSPOSITION Right 08/22/2014   Procedure: RIGHT UPPER ARM BASCILIC VEIN TRANSPOSITION;  Surgeon: Chuck Hint, MD;  Location: [facility] OR;  Service: Vascular;  Laterality: Right;   BELOW KNEE LEG AMPUTATION Right 04/26/2016   CARDIAC CATHETERIZATION     CARDIAC DEFIBRILLATOR PLACEMENT  06/27/2013   Sub Q       BY DR Graciela Husbands   CATARACT EXTRACTION W/PHACO Right 08/06/2018   Procedure: CATARACT EXTRACTION PHACO AND INTRAOCULAR LENS PLACEMENT (IOC);  Surgeon: Fabio Pierce, MD;  Location: [facility] ORS;  Service: Ophthalmology;  Laterality: Right;  CDE: 4.06   CATARACT EXTRACTION W/PHACO Left 08/20/2018   Procedure: CATARACT EXTRACTION PHACO AND INTRAOCULAR LENS PLACEMENT (IOC);  Surgeon: Fabio Pierce, MD;  Location: [facility] ORS;  Service: Ophthalmology;  Laterality: Left;  CDE: 6.76   COLONOSCOPY WITH PROPOFOL N/A 07/22/2015   Procedure: COLONOSCOPY WITH PROPOFOL;  Surgeon: Sherrilyn Rist, MD;  Location:  [facility] ENDOSCOPY;  Service: Gastroenterology;  Laterality: N/A;   FEMORAL-POPLITEAL BYPASS GRAFT Right 03/31/2016   Procedure: BYPASS GRAFT FEMORAL-POPLITEAL ARTERY USING RIGHT GREATER SAPHENOUS NONREVERSED VEIN;  Surgeon: Chuck Hint, MD;  Location: [facility] OR;  Service: Vascular;  Laterality: Right;   HERNIA REPAIR     I & D EXTREMITY Right 03/31/2016   Procedure: IRRIGATION AND DEBRIDEMENT FOOT;  Surgeon: Chuck Hint, MD;  Location: [facility] OR;  Service: Vascular;  Laterality: Right;   IMPLANTABLE CARDIOVERTER DEFIBRILLATOR IMPLANT N/A 06/27/2013   Procedure: SUB Q ICD;  Surgeon: Duke Salvia, MD;  Location: [facility] CATH LAB;  Service: Cardiovascular;  Laterality: N/A;   INTRAOPERATIVE ARTERIOGRAM Right 03/31/2016   Procedure: INTRA OPERATIVE ARTERIOGRAM;  Surgeon: Chuck Hint, MD;  Location: [facility] OR;  Service: Vascular;  Laterality: Right;   IR GENERIC HISTORICAL Right 11/30/2015   IR THROMBECTOMY AV FISTULA W/THROMBOLYSIS/PTA INC/SHUNT/IMG RIGHT 11/30/2015 Irish Lack, MD [facility] RAD   IR GENERIC HISTORICAL  11/30/2015   IR US GUIDE VASC ACCESS RIGHT 11/30/2015 Irish Lack, MD [facility]   IR GENERIC HISTORICAL Right 12/15/2015   IR THROMBECTOMY AV FISTULA W/THROMBOLYSIS/PTA/STENT INC/SHUNT/IMG RT 12/15/2015 Oley Balm, MD [facility]   IR GENERIC HISTORICAL  12/15/2015   IR US GUIDE VASC ACCESS RIGHT 12/15/2015 Oley Balm, MD [facility]   IR GENERIC HISTORICAL  12/28/2015   IR FLUORO GUIDE CV LINE RIGHT 12/28/2015 Jolaine Click, MD [facility]   IR GENERIC HISTORICAL  12/28/2015   IR US GUIDE VASC ACCESS RIGHT 12/28/2015 Jolaine Click, MD [facility]   LEFT A ND RIGHT HEART CATH  01/30/2013   DR Jones Broom   LEFT AND RIGHT HEART CATHETERIZATION WITH CORONARY ANGIOGRAM N/A 01/30/2013   Procedure: LEFT AND RIGHT HEART CATHETERIZATION WITH CORONARY ANGIOGRAM;  Surgeon: Dolores Patty, MD;  Location: [facility] CATH LAB;  Service: Cardiovascular;  Laterality: N/A;    PERIPHERAL VASCULAR CATHETERIZATION Right 01/26/2015   Procedure: A/V Fistulagram;  Surgeon: Chuck Hint, MD;  Location: [facility] INVASIVE CV LAB;  Service: Cardiovascular;  Laterality: Right;   reapea urethral surgery for recurrent obstruction  2011   TOTAL KNEE ARTHROPLASTY Right 2007   VEIN HARVEST Right 03/31/2016   Procedure: RIGHT GREATER SAPHENOUS VEIN HARVEST;  Surgeon: Chuck Hint, MD;  Location: [facility] OR;  Service: Vascular;  Laterality: Right;   Family History  Problem Relation Age of Onset   Bladder Cancer Mother    Alcohol abuse Father    Melanoma Father    Stroke Maternal Grandmother    Heart Problems Maternal Grandmother        unknown   Diabetes Maternal Grandmother    Heart disease Maternal Grandfather    Prostate cancer Maternal Grandfather    Social History   Socioeconomic History   Marital status: Married    Spouse name: Not on file   Number of children: 0   Years of education: Not on file   Highest education level: Not on file  Occupational History   Not on file  Tobacco Use   Smoking status: Former    Current packs/day: 0.00    Average packs/day: 2.0 packs/day for 32.0 years (64.0 ttl pk-yrs)    Types: Cigarettes    Start date: 05/11/1978    Quit date: 05/11/2010    Years since quitting: 12.7   Smokeless tobacco: Never  Vaping Use   Vaping status: Never Used  Substance and Sexual Activity   Alcohol use: No   Drug use: No   Sexual activity: Yes  Other Topics Concern   Not on file  Social History Narrative   Works at [employer] as a Therapist, music      Right handed   Wears glasses    Drinks 10 oz coffee daily   Drinks 2 sodas per day   Social Drivers of Health   Financial Resource Strain: Low Risk  (02/03/2023)   Overall Financial Resource Strain (CARDIA)    Difficulty of Paying Living Expenses: Not hard at all  Food Insecurity: No Food Insecurity (02/03/2023)   Hunger Vital Sign    Worried About Running Out of Food in the Last Year: Never  true    Ran Out of Food in the Last Year: Never true  Transportation Needs: No Transportation Needs (02/03/2023)   PRAPARE - Administrator, Civil Service (Medical): No    Lack of Transportation (Non-Medical): No  Physical Activity: Inactive (02/03/2023)   Exercise Vital Sign    Days of Exercise per Week: 0 days    Minutes of Exercise per Session: 0 min  Stress: No Stress Concern Present (02/03/2023)   [employer] of Occupational Health - Occupational Stress Questionnaire    Feeling of Stress : Not at all  Social Connections: Socially Integrated (02/03/2023)   Social Connection and Isolation Panel [NHANES]    Frequency of Communication with Friends and Family: More than three times a week    Frequency of Social Gatherings with Friends and Family: More than three times a week    Attends Religious Services: More than 4 times per year    Active Member of [employer] or Organizations: Yes    Attends Engineer, structural: More than 4 times per year    Marital Status: Married    Tobacco Counseling Counseling given: Not Answered   Clinical Intake:  Pre-visit preparation completed: Yes  Pain : No/denies pain     BMI - recorded: 29.56 Nutritional Status: BMI 25 -29 Overweight Nutritional Risks: None Diabetes: Yes CBG done?: Yes (CBG 85 Per patient) CBG resulted in Enter/ Edit results?: Yes Did pt. bring in CBG monitor from home?: No  How often do you need to have someone help you when you read instructions, pamphlets, or other written materials from your doctor or pharmacy?: 1 - Never  Interpreter Needed?: No  Information entered by :: Theresa Mulligan  LPN   Activities of Daily Living    02/03/2023    2:33 PM 09/06/2022   10:44 AM  In your present state of health, do you have any difficulty performing the following activities:  Hearing? 0 0  Vision? 0 0  Difficulty concentrating or making decisions? 0 0  Walking or climbing stairs? 1 1  Comment Uses a  Cane   Dressing or bathing? 0 0  Doing errands, shopping? 1 0  Comment Wife assist   Preparing Food and eating ? Y   Comment Wife assist   Using the Toilet? N   In the past six months, have you accidently leaked urine? N   Do you have problems with loss of bowel control? N   Managing your Medications? N   Managing your Finances? N   Housekeeping or managing your Housekeeping? N     Patient Care Team: Kristian Covey, MD as PCP - General (Family Medicine) Wyline Mood Dorothe Pea, MD as PCP - Cardiology (Cardiology) Duke Salvia, MD as PCP - Electrophysiology (Cardiology) Duke Salvia, MD as Consulting Physician (Cardiology) Salomon Mast, MD (Inactive) as Consulting Physician (Nephrology) Wyline Mood Dorothe Pea, MD as Consulting Physician (Cardiology) [place], Salvatore Decent, MD as Consulting Physician (Cardiology)  Indicate any recent Medical Services you may have received from other than [facility] providers in the past year (date may be approximate).     Assessment:   This is a routine wellness examination for Maureen.  Hearing/Vision screen Hearing Screening - Comments:: Denies hearing difficulties   Vision Screening - Comments:: Wears rx glasses - up to date with routine eye exams with  [facility]   Goals Addressed               This Visit's Progress     Patient stated (pt-stated)        I want to be more active.       Depression Screen    02/03/2023    2:32 PM 01/24/2022   10:38 AM 03/29/2021   10:56 AM 01/22/2021    9:06 AM 01/18/2021    8:00 AM 07/16/2020    3:07 PM 01/20/2020   11:34 AM  PHQ 2/9 Scores  PHQ - 2 Score 0 0 0 0 0 0 0  PHQ- 9 Score       0    Fall Risk    02/03/2023    2:34 PM 01/24/2022   10:40 AM 03/29/2021   10:54 AM 01/22/2021    9:09 AM 01/18/2021    8:00 AM  Fall Risk   Falls in the past year? 0 0 1 1 1   Number falls in past yr: 0 0 1 0 1  Comment   bruised ribs Patient fell due to prosthess    Injury with Fall? 0 0 1 0 0  Risk for fall due to : No Fall Risks No Fall Risks Impaired balance/gait;Impaired mobility    Follow up Falls prevention discussed Falls prevention discussed Education provided;Falls prevention discussed      MEDICARE RISK AT HOME: Medicare Risk at Home Any stairs in or around the home?: Yes If so, are there any without handrails?: No Home free of loose throw rugs in walkways, pet beds, electrical cords, etc?: Yes Adequate lighting in your home to reduce risk of falls?: Yes Life alert?: No Use of a cane, walker or w/c?: Yes Grab bars in the bathroom?: Yes Shower chair or bench in shower?:  Yes Elevated toilet seat or a handicapped toilet?: Yes  TIMED UP AND GO:  Was the test performed?  No    Cognitive Function:        02/03/2023    2:36 PM 01/24/2022   10:41 AM 01/22/2021    9:15 AM  6CIT Screen  What Year? 0 points 0 points 0 points  What month? 0 points 0 points 0 points  What time? 0 points 0 points 0 points  Count back from 20 0 points 0 points 0 points  Months in reverse 0 points 0 points 0 points  Repeat phrase 0 points 0 points 0 points  Total Score 0 points 0 points 0 points    Immunizations Immunization History  Administered Date(s) Administered   Fluad Quad(high Dose 65+) 10/16/2019   Hepatitis B, ADULT 07/27/2015, 08/28/2015, 09/30/2015, 02/01/2016, 03/21/2016   Influenza Split 11/10/2010, 11/10/2011, 10/03/2012   Influenza Whole 01/13/2010   Influenza, High Dose Seasonal PF 10/10/2020   Influenza,inj,Quad PF,6+ Mos 09/29/2014, 10/10/2017   Influenza-Unspecified 09/10/2013, 09/20/2015, 10/25/2016, 10/31/2017, 10/11/2018, 10/16/2019, 10/19/2022   Moderna Covid Bivalent Peds Booster(5mo Thru 28yrs) 11/07/2020   Moderna Sars-Covid-2 Vaccination 04/18/2019   PPD Test 04/29/2015, 07/07/2015, 07/07/2015, 07/21/2015, 07/21/2015, 02/01/2016, 02/01/2016, 02/09/2017, 02/09/2017, 02/15/2018, 02/15/2018, 06/20/2019, 06/20/2019    Pneumococcal Conjugate-13 09/23/2015   Pneumococcal Polysaccharide-23 09/23/2015   Pneumococcal-Unspecified 09/23/2015   Td 10/03/2008    TDAP status: Due, Education has been provided regarding the importance of this vaccine. Advised may receive this vaccine at local pharmacy or Health Dept. Aware to provide a copy of the vaccination record if obtained from local pharmacy or Health Dept. Verbalized acceptance and understanding.  Flu Vaccine status: Up to date  Pneumococcal vaccine status: Due, Education has been provided regarding the importance of this vaccine. Advised may receive this vaccine at local pharmacy or Health Dept. Aware to provide a copy of the vaccination record if obtained from local pharmacy or Health Dept. Verbalized acceptance and understanding.  Covid-19 vaccine status: Declined, Education has been provided regarding the importance of this vaccine but patient still declined. Advised may receive this vaccine at local pharmacy or Health Dept.or vaccine clinic. Aware to provide a copy of the vaccination record if obtained from local pharmacy or Health Dept. Verbalized acceptance and understanding.  Qualifies for Shingles Vaccine? Yes   Zostavax completed No   Shingrix Completed?: No.    Education has been provided regarding the importance of this vaccine. Patient has been advised to call insurance company to determine out of pocket expense if they have not yet received this vaccine. Advised may also receive vaccine at local pharmacy or Health Dept. Verbalized acceptance and understanding.  Screening Tests Health Maintenance  Topic Date Due   Lung Cancer Screening  Never done   Zoster Vaccines- Shingrix (1 of 2) Never done   DTaP/Tdap/Td (2 - Tdap) 10/04/2018   Pneumococcal Vaccine 53-15 Years old (3 of 3 - PPSV23 or PCV20) 09/22/2020   COVID-19 Vaccine (3 - 2024-25 season) 09/11/2022   OPHTHALMOLOGY EXAM  04/27/2023   HEMOGLOBIN A1C  06/21/2023   Medicare Annual Wellness  (AWV)  02/03/2024   Colonoscopy  07/21/2025   INFLUENZA VACCINE  Completed   Hepatitis C Screening  Completed   HIV Screening  Completed   HPV VACCINES  Aged Out   FOOT EXAM  Discontinued    Health Maintenance  Health Maintenance Due  Topic Date Due   Lung Cancer Screening  Never done   Zoster Vaccines- Shingrix (1 of  2) Never done   DTaP/Tdap/Td (2 - Tdap) 10/04/2018   Pneumococcal Vaccine 28-72 Years old (3 of 3 - PPSV23 or PCV20) 09/22/2020   COVID-19 Vaccine (3 - 2024-25 season) 09/11/2022    Colorectal cancer screening: Type of screening: Colonoscopy. Completed 07/22/15. Repeat every 10 years   Additional Screening:  Hepatitis C Screening: does qualify; Completed 01/25/17  Vision Screening: Recommended annual ophthalmology exams for early detection of glaucoma and other disorders of the eye. Is the patient up to date with their annual eye exam?  Yes  Who is the provider or what is the name of the office in which the patient attends annual eye exams? [facility] If pt is not established with a provider, would they like to be referred to a provider to establish care? No .   Dental Screening: Recommended annual dental exams for proper oral hygiene   Community Resource Referral / Chronic Care Management:  CRR required this visit?  No   CCM required this visit?  No     Plan:     I have personally reviewed and noted the following in the patient's chart:   Medical and social history Use of alcohol, tobacco or illicit drugs  Current medications and supplements including opioid prescriptions. Patient is not currently taking opioid prescriptions. Functional ability and status Nutritional status Physical activity Advanced directives List of other physicians Hospitalizations, surgeries, and ER visits in previous 12 months Vitals Screenings to include cognitive, depression, and falls Referrals and appointments  In addition, I have reviewed and discussed with  patient certain preventive protocols, quality metrics, and best practice recommendations. A written personalized care plan for preventive services as well as general preventive health recommendations were provided to patient.     Tillie Rung, LPN   [id number]   After Visit Summary: (MyChart) Due to this being a telephonic visit, the after visit summary with patients personalized plan was offered to patient via MyChart   Nurse Notes: None

## 2023-02-04 DIAGNOSIS — N250 Renal osteodystrophy: Secondary | ICD-10-CM | POA: Diagnosis not present

## 2023-02-04 DIAGNOSIS — D631 Anemia in chronic kidney disease: Secondary | ICD-10-CM | POA: Diagnosis not present

## 2023-02-04 DIAGNOSIS — D509 Iron deficiency anemia, unspecified: Secondary | ICD-10-CM | POA: Diagnosis not present

## 2023-02-04 DIAGNOSIS — Z992 Dependence on renal dialysis: Secondary | ICD-10-CM | POA: Diagnosis not present

## 2023-02-04 DIAGNOSIS — N186 End stage renal disease: Secondary | ICD-10-CM | POA: Diagnosis not present

## 2023-02-04 DIAGNOSIS — N2581 Secondary hyperparathyroidism of renal origin: Secondary | ICD-10-CM | POA: Diagnosis not present

## 2023-02-07 DIAGNOSIS — N186 End stage renal disease: Secondary | ICD-10-CM | POA: Diagnosis not present

## 2023-02-07 DIAGNOSIS — Z992 Dependence on renal dialysis: Secondary | ICD-10-CM | POA: Diagnosis not present

## 2023-02-07 DIAGNOSIS — N2581 Secondary hyperparathyroidism of renal origin: Secondary | ICD-10-CM | POA: Diagnosis not present

## 2023-02-07 DIAGNOSIS — N250 Renal osteodystrophy: Secondary | ICD-10-CM | POA: Diagnosis not present

## 2023-02-07 DIAGNOSIS — D509 Iron deficiency anemia, unspecified: Secondary | ICD-10-CM | POA: Diagnosis not present

## 2023-02-07 DIAGNOSIS — D631 Anemia in chronic kidney disease: Secondary | ICD-10-CM | POA: Diagnosis not present

## 2023-02-09 DIAGNOSIS — Z992 Dependence on renal dialysis: Secondary | ICD-10-CM | POA: Diagnosis not present

## 2023-02-09 DIAGNOSIS — D631 Anemia in chronic kidney disease: Secondary | ICD-10-CM | POA: Diagnosis not present

## 2023-02-09 DIAGNOSIS — N250 Renal osteodystrophy: Secondary | ICD-10-CM | POA: Diagnosis not present

## 2023-02-09 DIAGNOSIS — N186 End stage renal disease: Secondary | ICD-10-CM | POA: Diagnosis not present

## 2023-02-09 DIAGNOSIS — D509 Iron deficiency anemia, unspecified: Secondary | ICD-10-CM | POA: Diagnosis not present

## 2023-02-09 DIAGNOSIS — N2581 Secondary hyperparathyroidism of renal origin: Secondary | ICD-10-CM | POA: Diagnosis not present

## 2023-02-10 DIAGNOSIS — Z992 Dependence on renal dialysis: Secondary | ICD-10-CM | POA: Diagnosis not present

## 2023-02-10 DIAGNOSIS — N186 End stage renal disease: Secondary | ICD-10-CM | POA: Diagnosis not present

## 2023-02-11 DIAGNOSIS — N2581 Secondary hyperparathyroidism of renal origin: Secondary | ICD-10-CM | POA: Diagnosis not present

## 2023-02-11 DIAGNOSIS — D509 Iron deficiency anemia, unspecified: Secondary | ICD-10-CM | POA: Diagnosis not present

## 2023-02-11 DIAGNOSIS — N250 Renal osteodystrophy: Secondary | ICD-10-CM | POA: Diagnosis not present

## 2023-02-11 DIAGNOSIS — Z992 Dependence on renal dialysis: Secondary | ICD-10-CM | POA: Diagnosis not present

## 2023-02-11 DIAGNOSIS — D631 Anemia in chronic kidney disease: Secondary | ICD-10-CM | POA: Diagnosis not present

## 2023-02-11 DIAGNOSIS — N186 End stage renal disease: Secondary | ICD-10-CM | POA: Diagnosis not present

## 2023-02-14 DIAGNOSIS — N2581 Secondary hyperparathyroidism of renal origin: Secondary | ICD-10-CM | POA: Diagnosis not present

## 2023-02-14 DIAGNOSIS — D631 Anemia in chronic kidney disease: Secondary | ICD-10-CM | POA: Diagnosis not present

## 2023-02-14 DIAGNOSIS — N250 Renal osteodystrophy: Secondary | ICD-10-CM | POA: Diagnosis not present

## 2023-02-14 DIAGNOSIS — Z992 Dependence on renal dialysis: Secondary | ICD-10-CM | POA: Diagnosis not present

## 2023-02-14 DIAGNOSIS — N186 End stage renal disease: Secondary | ICD-10-CM | POA: Diagnosis not present

## 2023-02-14 DIAGNOSIS — D509 Iron deficiency anemia, unspecified: Secondary | ICD-10-CM | POA: Diagnosis not present

## 2023-02-15 DIAGNOSIS — N186 End stage renal disease: Secondary | ICD-10-CM | POA: Diagnosis not present

## 2023-02-15 DIAGNOSIS — Z992 Dependence on renal dialysis: Secondary | ICD-10-CM | POA: Diagnosis not present

## 2023-02-15 DIAGNOSIS — N250 Renal osteodystrophy: Secondary | ICD-10-CM | POA: Diagnosis not present

## 2023-02-15 DIAGNOSIS — D631 Anemia in chronic kidney disease: Secondary | ICD-10-CM | POA: Diagnosis not present

## 2023-02-15 DIAGNOSIS — N2581 Secondary hyperparathyroidism of renal origin: Secondary | ICD-10-CM | POA: Diagnosis not present

## 2023-02-15 DIAGNOSIS — D509 Iron deficiency anemia, unspecified: Secondary | ICD-10-CM | POA: Diagnosis not present

## 2023-02-16 DIAGNOSIS — N250 Renal osteodystrophy: Secondary | ICD-10-CM | POA: Diagnosis not present

## 2023-02-16 DIAGNOSIS — Z992 Dependence on renal dialysis: Secondary | ICD-10-CM | POA: Diagnosis not present

## 2023-02-16 DIAGNOSIS — N2581 Secondary hyperparathyroidism of renal origin: Secondary | ICD-10-CM | POA: Diagnosis not present

## 2023-02-16 DIAGNOSIS — D631 Anemia in chronic kidney disease: Secondary | ICD-10-CM | POA: Diagnosis not present

## 2023-02-16 DIAGNOSIS — N186 End stage renal disease: Secondary | ICD-10-CM | POA: Diagnosis not present

## 2023-02-16 DIAGNOSIS — D509 Iron deficiency anemia, unspecified: Secondary | ICD-10-CM | POA: Diagnosis not present

## 2023-02-18 DIAGNOSIS — N186 End stage renal disease: Secondary | ICD-10-CM | POA: Diagnosis not present

## 2023-02-18 DIAGNOSIS — Z992 Dependence on renal dialysis: Secondary | ICD-10-CM | POA: Diagnosis not present

## 2023-02-18 DIAGNOSIS — D509 Iron deficiency anemia, unspecified: Secondary | ICD-10-CM | POA: Diagnosis not present

## 2023-02-18 DIAGNOSIS — N250 Renal osteodystrophy: Secondary | ICD-10-CM | POA: Diagnosis not present

## 2023-02-18 DIAGNOSIS — D631 Anemia in chronic kidney disease: Secondary | ICD-10-CM | POA: Diagnosis not present

## 2023-02-18 DIAGNOSIS — N2581 Secondary hyperparathyroidism of renal origin: Secondary | ICD-10-CM | POA: Diagnosis not present

## 2023-02-21 ENCOUNTER — Telehealth: Payer: Self-pay | Admitting: Neurology

## 2023-02-21 ENCOUNTER — Ambulatory Visit: Payer: Medicare Other | Admitting: Neurology

## 2023-02-21 DIAGNOSIS — N186 End stage renal disease: Secondary | ICD-10-CM | POA: Diagnosis not present

## 2023-02-21 DIAGNOSIS — N250 Renal osteodystrophy: Secondary | ICD-10-CM | POA: Diagnosis not present

## 2023-02-21 DIAGNOSIS — N2581 Secondary hyperparathyroidism of renal origin: Secondary | ICD-10-CM | POA: Diagnosis not present

## 2023-02-21 DIAGNOSIS — D509 Iron deficiency anemia, unspecified: Secondary | ICD-10-CM | POA: Diagnosis not present

## 2023-02-21 DIAGNOSIS — Z992 Dependence on renal dialysis: Secondary | ICD-10-CM | POA: Diagnosis not present

## 2023-02-21 DIAGNOSIS — D631 Anemia in chronic kidney disease: Secondary | ICD-10-CM | POA: Diagnosis not present

## 2023-02-21 NOTE — Telephone Encounter (Signed)
Appointment R/s due to weather

## 2023-02-23 DIAGNOSIS — Z992 Dependence on renal dialysis: Secondary | ICD-10-CM | POA: Diagnosis not present

## 2023-02-23 DIAGNOSIS — N2581 Secondary hyperparathyroidism of renal origin: Secondary | ICD-10-CM | POA: Diagnosis not present

## 2023-02-23 DIAGNOSIS — N186 End stage renal disease: Secondary | ICD-10-CM | POA: Diagnosis not present

## 2023-02-23 DIAGNOSIS — N250 Renal osteodystrophy: Secondary | ICD-10-CM | POA: Diagnosis not present

## 2023-02-23 DIAGNOSIS — D631 Anemia in chronic kidney disease: Secondary | ICD-10-CM | POA: Diagnosis not present

## 2023-02-23 DIAGNOSIS — D509 Iron deficiency anemia, unspecified: Secondary | ICD-10-CM | POA: Diagnosis not present

## 2023-02-25 DIAGNOSIS — D509 Iron deficiency anemia, unspecified: Secondary | ICD-10-CM | POA: Diagnosis not present

## 2023-02-25 DIAGNOSIS — D631 Anemia in chronic kidney disease: Secondary | ICD-10-CM | POA: Diagnosis not present

## 2023-02-25 DIAGNOSIS — N186 End stage renal disease: Secondary | ICD-10-CM | POA: Diagnosis not present

## 2023-02-25 DIAGNOSIS — N2581 Secondary hyperparathyroidism of renal origin: Secondary | ICD-10-CM | POA: Diagnosis not present

## 2023-02-25 DIAGNOSIS — N250 Renal osteodystrophy: Secondary | ICD-10-CM | POA: Diagnosis not present

## 2023-02-25 DIAGNOSIS — Z992 Dependence on renal dialysis: Secondary | ICD-10-CM | POA: Diagnosis not present

## 2023-02-28 DIAGNOSIS — Z6828 Body mass index (BMI) 28.0-28.9, adult: Secondary | ICD-10-CM | POA: Diagnosis not present

## 2023-02-28 DIAGNOSIS — K219 Gastro-esophageal reflux disease without esophagitis: Secondary | ICD-10-CM | POA: Diagnosis not present

## 2023-02-28 DIAGNOSIS — N186 End stage renal disease: Secondary | ICD-10-CM | POA: Diagnosis not present

## 2023-02-28 DIAGNOSIS — D509 Iron deficiency anemia, unspecified: Secondary | ICD-10-CM | POA: Diagnosis not present

## 2023-02-28 DIAGNOSIS — D631 Anemia in chronic kidney disease: Secondary | ICD-10-CM | POA: Diagnosis not present

## 2023-02-28 DIAGNOSIS — R0981 Nasal congestion: Secondary | ICD-10-CM | POA: Diagnosis not present

## 2023-02-28 DIAGNOSIS — Z992 Dependence on renal dialysis: Secondary | ICD-10-CM | POA: Diagnosis not present

## 2023-02-28 DIAGNOSIS — E663 Overweight: Secondary | ICD-10-CM | POA: Diagnosis not present

## 2023-02-28 DIAGNOSIS — N250 Renal osteodystrophy: Secondary | ICD-10-CM | POA: Diagnosis not present

## 2023-02-28 DIAGNOSIS — N2581 Secondary hyperparathyroidism of renal origin: Secondary | ICD-10-CM | POA: Diagnosis not present

## 2023-03-01 DIAGNOSIS — Z992 Dependence on renal dialysis: Secondary | ICD-10-CM | POA: Diagnosis not present

## 2023-03-01 DIAGNOSIS — N250 Renal osteodystrophy: Secondary | ICD-10-CM | POA: Diagnosis not present

## 2023-03-01 DIAGNOSIS — N186 End stage renal disease: Secondary | ICD-10-CM | POA: Diagnosis not present

## 2023-03-01 DIAGNOSIS — N2581 Secondary hyperparathyroidism of renal origin: Secondary | ICD-10-CM | POA: Diagnosis not present

## 2023-03-01 DIAGNOSIS — D509 Iron deficiency anemia, unspecified: Secondary | ICD-10-CM | POA: Diagnosis not present

## 2023-03-01 DIAGNOSIS — D631 Anemia in chronic kidney disease: Secondary | ICD-10-CM | POA: Diagnosis not present

## 2023-03-04 DIAGNOSIS — Z992 Dependence on renal dialysis: Secondary | ICD-10-CM | POA: Diagnosis not present

## 2023-03-04 DIAGNOSIS — N2581 Secondary hyperparathyroidism of renal origin: Secondary | ICD-10-CM | POA: Diagnosis not present

## 2023-03-04 DIAGNOSIS — D509 Iron deficiency anemia, unspecified: Secondary | ICD-10-CM | POA: Diagnosis not present

## 2023-03-04 DIAGNOSIS — D631 Anemia in chronic kidney disease: Secondary | ICD-10-CM | POA: Diagnosis not present

## 2023-03-04 DIAGNOSIS — N186 End stage renal disease: Secondary | ICD-10-CM | POA: Diagnosis not present

## 2023-03-04 DIAGNOSIS — N250 Renal osteodystrophy: Secondary | ICD-10-CM | POA: Diagnosis not present

## 2023-03-06 ENCOUNTER — Encounter (HOSPITAL_COMMUNITY): Payer: Self-pay | Admitting: Emergency Medicine

## 2023-03-06 ENCOUNTER — Other Ambulatory Visit: Payer: Self-pay

## 2023-03-06 ENCOUNTER — Emergency Department (HOSPITAL_COMMUNITY): Payer: Medicare Other

## 2023-03-06 ENCOUNTER — Inpatient Hospital Stay (HOSPITAL_COMMUNITY)
Admission: EM | Admit: 2023-03-06 | Discharge: 2023-03-08 | DRG: 189 | Disposition: A | Discharge status: Home / Self Care | Payer: Medicare Other | Attending: Family Medicine | Admitting: Family Medicine

## 2023-03-06 DIAGNOSIS — R140 Abdominal distension (gaseous): Secondary | ICD-10-CM | POA: Diagnosis not present

## 2023-03-06 DIAGNOSIS — Z79899 Other long term (current) drug therapy: Secondary | ICD-10-CM

## 2023-03-06 DIAGNOSIS — J309 Allergic rhinitis, unspecified: Secondary | ICD-10-CM | POA: Diagnosis present

## 2023-03-06 DIAGNOSIS — Z992 Dependence on renal dialysis: Secondary | ICD-10-CM

## 2023-03-06 DIAGNOSIS — D649 Anemia, unspecified: Secondary | ICD-10-CM | POA: Diagnosis present

## 2023-03-06 DIAGNOSIS — Z9842 Cataract extraction status, left eye: Secondary | ICD-10-CM

## 2023-03-06 DIAGNOSIS — E1151 Type 2 diabetes mellitus with diabetic peripheral angiopathy without gangrene: Secondary | ICD-10-CM | POA: Diagnosis present

## 2023-03-06 DIAGNOSIS — Z885 Allergy status to narcotic agent status: Secondary | ICD-10-CM

## 2023-03-06 DIAGNOSIS — R069 Unspecified abnormalities of breathing: Secondary | ICD-10-CM | POA: Diagnosis not present

## 2023-03-06 DIAGNOSIS — I517 Cardiomegaly: Secondary | ICD-10-CM | POA: Diagnosis not present

## 2023-03-06 DIAGNOSIS — I953 Hypotension of hemodialysis: Secondary | ICD-10-CM | POA: Diagnosis present

## 2023-03-06 DIAGNOSIS — Z87442 Personal history of urinary calculi: Secondary | ICD-10-CM

## 2023-03-06 DIAGNOSIS — J449 Chronic obstructive pulmonary disease, unspecified: Secondary | ICD-10-CM | POA: Diagnosis present

## 2023-03-06 DIAGNOSIS — R0602 Shortness of breath: Secondary | ICD-10-CM | POA: Diagnosis not present

## 2023-03-06 DIAGNOSIS — N186 End stage renal disease: Secondary | ICD-10-CM

## 2023-03-06 DIAGNOSIS — R079 Chest pain, unspecified: Secondary | ICD-10-CM | POA: Diagnosis not present

## 2023-03-06 DIAGNOSIS — I2489 Other forms of acute ischemic heart disease: Secondary | ICD-10-CM | POA: Diagnosis not present

## 2023-03-06 DIAGNOSIS — I428 Other cardiomyopathies: Secondary | ICD-10-CM | POA: Diagnosis present

## 2023-03-06 DIAGNOSIS — I132 Hypertensive heart and chronic kidney disease with heart failure and with stage 5 chronic kidney disease, or end stage renal disease: Secondary | ICD-10-CM | POA: Diagnosis present

## 2023-03-06 DIAGNOSIS — I5022 Chronic systolic (congestive) heart failure: Secondary | ICD-10-CM | POA: Diagnosis present

## 2023-03-06 DIAGNOSIS — Z794 Long term (current) use of insulin: Secondary | ICD-10-CM

## 2023-03-06 DIAGNOSIS — Z7901 Long term (current) use of anticoagulants: Secondary | ICD-10-CM

## 2023-03-06 DIAGNOSIS — J9601 Acute respiratory failure with hypoxia: Principal | ICD-10-CM | POA: Diagnosis present

## 2023-03-06 DIAGNOSIS — E782 Mixed hyperlipidemia: Secondary | ICD-10-CM | POA: Diagnosis present

## 2023-03-06 DIAGNOSIS — Z87891 Personal history of nicotine dependence: Secondary | ICD-10-CM

## 2023-03-06 DIAGNOSIS — E1122 Type 2 diabetes mellitus with diabetic chronic kidney disease: Secondary | ICD-10-CM | POA: Diagnosis present

## 2023-03-06 DIAGNOSIS — Z89512 Acquired absence of left leg below knee: Secondary | ICD-10-CM

## 2023-03-06 DIAGNOSIS — I4891 Unspecified atrial fibrillation: Secondary | ICD-10-CM | POA: Diagnosis present

## 2023-03-06 DIAGNOSIS — I10 Essential (primary) hypertension: Secondary | ICD-10-CM | POA: Diagnosis present

## 2023-03-06 DIAGNOSIS — Z96651 Presence of right artificial knee joint: Secondary | ICD-10-CM | POA: Diagnosis present

## 2023-03-06 DIAGNOSIS — D631 Anemia in chronic kidney disease: Secondary | ICD-10-CM | POA: Diagnosis not present

## 2023-03-06 DIAGNOSIS — Z8249 Family history of ischemic heart disease and other diseases of the circulatory system: Secondary | ICD-10-CM

## 2023-03-06 DIAGNOSIS — I739 Peripheral vascular disease, unspecified: Secondary | ICD-10-CM | POA: Diagnosis present

## 2023-03-06 DIAGNOSIS — I700 Atherosclerosis of aorta: Secondary | ICD-10-CM | POA: Diagnosis not present

## 2023-03-06 DIAGNOSIS — E8770 Fluid overload, unspecified: Secondary | ICD-10-CM | POA: Diagnosis present

## 2023-03-06 DIAGNOSIS — I480 Paroxysmal atrial fibrillation: Secondary | ICD-10-CM | POA: Diagnosis present

## 2023-03-06 DIAGNOSIS — E875 Hyperkalemia: Secondary | ICD-10-CM | POA: Diagnosis not present

## 2023-03-06 DIAGNOSIS — Z888 Allergy status to other drugs, medicaments and biological substances status: Secondary | ICD-10-CM

## 2023-03-06 DIAGNOSIS — E1165 Type 2 diabetes mellitus with hyperglycemia: Secondary | ICD-10-CM | POA: Diagnosis present

## 2023-03-06 DIAGNOSIS — Z961 Presence of intraocular lens: Secondary | ICD-10-CM | POA: Diagnosis present

## 2023-03-06 DIAGNOSIS — Z89511 Acquired absence of right leg below knee: Secondary | ICD-10-CM

## 2023-03-06 DIAGNOSIS — Z8616 Personal history of COVID-19: Secondary | ICD-10-CM

## 2023-03-06 DIAGNOSIS — I959 Hypotension, unspecified: Secondary | ICD-10-CM | POA: Diagnosis not present

## 2023-03-06 DIAGNOSIS — E1140 Type 2 diabetes mellitus with diabetic neuropathy, unspecified: Secondary | ICD-10-CM | POA: Diagnosis present

## 2023-03-06 DIAGNOSIS — Z833 Family history of diabetes mellitus: Secondary | ICD-10-CM

## 2023-03-06 DIAGNOSIS — Z95810 Presence of automatic (implantable) cardiac defibrillator: Secondary | ICD-10-CM

## 2023-03-06 DIAGNOSIS — Z1152 Encounter for screening for COVID-19: Secondary | ICD-10-CM

## 2023-03-06 DIAGNOSIS — E8779 Other fluid overload: Secondary | ICD-10-CM | POA: Diagnosis not present

## 2023-03-06 DIAGNOSIS — R609 Edema, unspecified: Secondary | ICD-10-CM | POA: Diagnosis not present

## 2023-03-06 DIAGNOSIS — I2510 Atherosclerotic heart disease of native coronary artery without angina pectoris: Secondary | ICD-10-CM | POA: Diagnosis present

## 2023-03-06 DIAGNOSIS — Z9841 Cataract extraction status, right eye: Secondary | ICD-10-CM

## 2023-03-06 DIAGNOSIS — I5020 Unspecified systolic (congestive) heart failure: Secondary | ICD-10-CM | POA: Diagnosis not present

## 2023-03-06 LAB — CBC WITH DIFFERENTIAL/PLATELET
Abs Immature Granulocytes: 0.04 10*3/uL (ref 0.00–0.07)
Basophils Absolute: 0.1 10*3/uL (ref 0.0–0.1)
Basophils Relative: 1 %
Eosinophils Absolute: 0.4 10*3/uL (ref 0.0–0.5)
Eosinophils Relative: 3 %
HCT: 36.3 % — ABNORMAL LOW (ref 39.0–52.0)
Hemoglobin: 11.8 g/dL — ABNORMAL LOW (ref 13.0–17.0)
Immature Granulocytes: 0 %
Lymphocytes Relative: 11 %
Lymphs Abs: 1.2 10*3/uL (ref 0.7–4.0)
MCH: 29.3 pg (ref 26.0–34.0)
MCHC: 32.5 g/dL (ref 30.0–36.0)
MCV: 90.1 fL (ref 80.0–100.0)
Monocytes Absolute: 1.1 10*3/uL — ABNORMAL HIGH (ref 0.1–1.0)
Monocytes Relative: 10 %
Neutro Abs: 8.6 10*3/uL — ABNORMAL HIGH (ref 1.7–7.7)
Neutrophils Relative %: 75 %
Platelets: 205 10*3/uL (ref 150–400)
RBC: 4.03 MIL/uL — ABNORMAL LOW (ref 4.22–5.81)
RDW: 14.8 % (ref 11.5–15.5)
WBC: 11.5 10*3/uL — ABNORMAL HIGH (ref 4.0–10.5)
nRBC: 0 % (ref 0.0–0.2)

## 2023-03-06 LAB — COMPREHENSIVE METABOLIC PANEL
ALT: 28 U/L (ref 0–44)
AST: 27 U/L (ref 15–41)
Albumin: 3.2 g/dL — ABNORMAL LOW (ref 3.5–5.0)
Alkaline Phosphatase: 95 U/L (ref 38–126)
Anion gap: 20 — ABNORMAL HIGH (ref 5–15)
BUN: 103 mg/dL — ABNORMAL HIGH (ref 8–23)
CO2: 16 mmol/L — ABNORMAL LOW (ref 22–32)
Calcium: 9.1 mg/dL (ref 8.9–10.3)
Chloride: 96 mmol/L — ABNORMAL LOW (ref 98–111)
Creatinine, Ser: 12.24 mg/dL — ABNORMAL HIGH (ref 0.61–1.24)
GFR, Estimated: 4 mL/min — ABNORMAL LOW (ref >60)
Glucose, Bld: 178 mg/dL — ABNORMAL HIGH (ref 70–99)
Potassium: 5.4 mmol/L — ABNORMAL HIGH (ref 3.5–5.1)
Sodium: 132 mmol/L — ABNORMAL LOW (ref 135–145)
Total Bilirubin: 0.7 mg/dL (ref 0.0–1.2)
Total Protein: 7.5 g/dL (ref 6.5–8.1)

## 2023-03-06 LAB — TROPONIN I (HIGH SENSITIVITY)
Troponin I (High Sensitivity): 44 ng/L — ABNORMAL HIGH (ref <18)
Troponin I (High Sensitivity): 53 ng/L — ABNORMAL HIGH (ref <18)

## 2023-03-06 LAB — BRAIN NATRIURETIC PEPTIDE: B Natriuretic Peptide: 576 pg/mL — ABNORMAL HIGH (ref 0.0–100.0)

## 2023-03-06 MED ORDER — IPRATROPIUM-ALBUTEROL 0.5-2.5 (3) MG/3ML IN SOLN
3.0000 mL | Freq: Once | RESPIRATORY_TRACT | Status: AC
  Administered 2023-03-06: 3 mL via RESPIRATORY_TRACT
  Filled 2023-03-06: qty 3

## 2023-03-06 NOTE — ED Notes (Signed)
 Pt keeps asking for his oxgen to get turned up. Pt at 6 L and not normally on oxygen at home. RN educated him that his sats are great and body doesn't need it. Pt seems anxious. Education provided regarding air hunger vs real need for supplemental oxygen.

## 2023-03-06 NOTE — ED Notes (Signed)
 Patient transported to X-ray

## 2023-03-06 NOTE — ED Provider Notes (Signed)
 [place] [facility] Provider Note   CSN: [id number] Arrival date & time: 03/06/23  2037     History {Add pertinent medical, surgical, social history, OB history to HPI:1} Chief Complaint  Patient presents with   Chest Pain    Anthony Bullock is a 63 y.o. male.  63 year old male with a history of COPD, CHF, end-stage renal disease on dialysis who presents ER today for chest pain and shortness of breath.  Patient states that started 7:00 tonight.  Patient states it feels like previous episodes of fluid on his lungs.  Patient is begging to be put on BiPAP in the setting of mildly elevated respiratory rate and normal oxygen on room air.  Denies any fever or productive cough.  He has no legs so unclear on edema.  Says he went to dialysis last on Saturday and rarely misses dialysis.   Chest Pain      Home Medications Prior to Admission medications   Medication Sig Start Date End Date Taking? Authorizing Provider  acetaminophen (TYLENOL) 325 MG tablet Take 1-2 tablets (325-650 mg total) by mouth every 4 (four) hours as needed for mild pain. 11/29/19   Love, Evlyn Kanner, PA-C  apixaban (ELIQUIS) 5 MG TABS tablet Take 1 tablet (5 mg total) by mouth 2 (two) times daily. 12/21/22   Burchette, Elberta Fortis, MD  atorvastatin (LIPITOR) 80 MG tablet TAKE 1 TABLET BY MOUTH AT BEDTIME Patient taking differently: Take 80 mg by mouth daily. 06/24/22   Burchette, Elberta Fortis, MD  azelastine (ASTELIN) 0.1 % nasal spray Place 2 sprays into both nostrils 3 (three) times daily as needed for rhinitis. Use in each nostril as directed 03/10/14   Burchette, Elberta Fortis, MD  carvedilol (COREG) 12.5 MG tablet Take 1 tablet (12.5 mg total) by mouth 2 (two) times daily. 01/25/23   Burchette, Elberta Fortis, MD  cinacalcet (SENSIPAR) 30 MG tablet Take 1 tablet (30 mg total) by mouth Every Tuesday,Thursday,and Saturday with dialysis. 11/30/19   Love, Evlyn Kanner, PA-C  Continuous Glucose Receiver (FREESTYLE  LIBRE 3 READER) DEVI Use to check blood glucose TID 08/11/22   Burchette, Elberta Fortis, MD  Continuous Glucose Sensor (FREESTYLE LIBRE 3 PLUS SENSOR) MISC Change sensor every 15 days. Use as directed to check glucose daily 12/21/22   Burchette, Elberta Fortis, MD  fenofibrate 160 MG tablet TAKE 1 TABLET BY MOUTH ONCE DAILY Patient taking differently: Take 160 mg by mouth daily. 06/24/22   Burchette, Elberta Fortis, MD  fexofenadine (ALLEGRA) 180 MG tablet Take 180 mg by mouth daily as needed for allergies.    [provider]  gabapentin (NEURONTIN) 300 MG capsule TAKE 2 CAPSULES BY MOUTH TWICE DAILY . Patient taking differently: Take 600 mg by mouth 2 (two) times daily. 06/24/22   Burchette, Elberta Fortis, MD  glucose blood test strip Check 1 time daily. E11.9 One Touch Ultra Blue Test Strips 03/10/14   Burchette, Elberta Fortis, MD  HUMALOG KWIKPEN 200 UNIT/ML KwikPen INJECT A MAXIMUM OF 28 UNITS SUBCUTANEOUSLY TWICE DAILY WITH LUNCH AND SUPPER PER SLIDING SCALE. APPOINTMENT REQUIRED FOR FUTURE REFILLS 11/11/22   Burchette, Elberta Fortis, MD  Insulin Pen Needle (BD PEN NEEDLE NANO U/F) 32G X 4 MM MISC USE 1 PEN NEEDLE SUBCUTANEOUSLY WITH INSULIN 4 TIMES DAILY 12/04/19   Burchette, Elberta Fortis, MD  lanthanum (FOSRENOL) 500 MG chewable tablet Chew 500-1,000 mg by mouth See admin instructions. Take 1000 mg with meals three time a day and 500 mg  with snacks    [provider]  LANTUS SOLOSTAR 100 UNIT/ML Solostar Pen INJECT 12 UNITS SUBCUTANEOUSLY AT BEDTIME Patient taking differently: Inject 14 Units into the skin at bedtime. 07/15/22   Burchette, Elberta Fortis, MD  midodrine (PROAMATINE) 10 MG tablet Take 10 mg by mouth 3 (three) times a week. EVERY TUESDAY, THURSDAY, AND SATURDAY 08/04/20   [provider]  multivitamin (RENA-VIT) TABS tablet Take 1 tablet by mouth once daily Patient taking differently: Take 1 tablet by mouth daily. 07/06/21   Burchette, Elberta Fortis, MD  Olopatadine HCl 0.2 % SOLN Place 1 drop into both eyes daily as  needed (for allergies).     [provider]  tobramycin (TOBREX) 0.3 % ophthalmic solution Place 1 drop into the right eye every 6 (six) hours. 08/02/22   [provider]  VENTOLIN HFA 108 (90 Base) MCG/ACT inhaler Inhale 1-2 puffs into the lungs every 6 (six) hours as needed for wheezing or shortness of breath. 07/25/22   Burchette, Elberta Fortis, MD      Allergies    Epoetin alfa, Ferumoxytol, and Morphine sulfate    Review of Systems   Review of Systems  Cardiovascular:  Positive for chest pain.    Physical Exam Updated Vital Signs BP 129/61   Pulse 97   Temp 98.3 F (36.8 C) (Oral)   Ht 5\' 10"  (1.778 m)   Wt 93 kg   SpO2 100%   BMI 29.42 kg/m  Physical Exam Vitals and nursing note reviewed.  Constitutional:      Appearance: He is well-developed.  HENT:     Head: Normocephalic and atraumatic.  Cardiovascular:     Rate and Rhythm: Normal rate.  Pulmonary:     Effort: Pulmonary effort is normal. No respiratory distress.     Breath sounds: Wheezing (scattered and not consistent) present. No rales.  Abdominal:     General: There is no distension.  Musculoskeletal:     Cervical back: Normal range of motion.     Comments: Bilateral BKA's  Neurological:     Mental Status: He is alert.     ED Results / Procedures / Treatments   Labs (all labs ordered are listed, but only abnormal results are displayed) Labs Reviewed  COMPREHENSIVE METABOLIC PANEL - Abnormal; Notable for the following components:      Result Value   Sodium 132 (*)    Potassium 5.4 (*)    Chloride 96 (*)    CO2 16 (*)    Glucose, Bld 178 (*)    BUN 103 (*)    Creatinine, Ser 12.24 (*)    Albumin 3.2 (*)    GFR, Estimated 4 (*)    Anion gap 20 (*)    All other components within normal limits  CBC WITH DIFFERENTIAL/PLATELET - Abnormal; Notable for the following components:   WBC 11.5 (*)    RBC 4.03 (*)    Hemoglobin 11.8 (*)    HCT 36.3 (*)    Neutro Abs 8.6 (*)    Monocytes  Absolute 1.1 (*)    All other components within normal limits  BRAIN NATRIURETIC PEPTIDE - Abnormal; Notable for the following components:   B Natriuretic Peptide 576.0 (*)    All other components within normal limits  TROPONIN I (HIGH SENSITIVITY) - Abnormal; Notable for the following components:   Troponin I (High Sensitivity) 44 (*)    All other components within normal limits  RESP PANEL BY RT-PCR (RSV, FLU A&B, COVID)  [id number]  TROPONIN I (HIGH SENSITIVITY)    EKG EKG Interpretation Date/Time:  Monday March 06 2023 20:54:45 EST Ventricular Rate:  97 PR Interval:  196 QRS Duration:  132 QT Interval:  387 QTC Calculation: 492 R Axis:   -56  Text Interpretation: Sinus rhythm Atrial premature complex Left bundle branch block Nonspecific ST and T wave abnormality No significant change since last tracing Confirmed by Derwood Kaplan [phone]) on 03/06/2023 10:25:48 PM  Radiology DG Chest 2 View Result Date: 03/06/2023 CLINICAL DATA:  Chest pain, stomach bloating, sore spot on the right side. EXAM: CHEST - 2 VIEW COMPARISON:  09/24/2022 FINDINGS: Right central venous catheter with tip over the cavoatrial junction region. Cardiac defibrillator. Mild cardiac enlargement. Linear scarring in the lung bases. No airspace disease or consolidation in the lungs. No pleural effusion or pneumothorax. Mediastinal contours appear intact. Calcification of the aorta. IMPRESSION: Appliances are unchanged in position.  Lungs are clear today. Electronically Signed   By: Burman Nieves M.D.   On: 03/06/2023 23:16    Procedures Procedures  {Document cardiac monitor, telemetry assessment procedure when appropriate:1}  Medications Ordered in ED Medications  ipratropium-albuterol (DUONEB) 0.5-2.5 (3) MG/3ML nebulizer solution 3 mL (has no administration in time range)    ED Course/ Medical Decision Making/ A&P   {   Click here for ABCD2, HEART and other calculatorsREFRESH Note before signing :1}                               Medical Decision Making Amount and/or Complexity of Data Reviewed Labs: ordered. Radiology: ordered.  Risk Prescription drug management.  Patient is in no respiratory distress and has clear lung sounds.  His labs are all stable versus improved compared to previous aside from his kidney function and anion gap.  Lungs are clear chest x-ray is clear I do not see any indication for it at this time.  Will try a breathing treatment to see if that helps some.  Will discuss with nephrology for dialysis and get him admitted to the hospital. Discussed with nephrology, Dr. Arrie Aran and no emergent indication for dialysis but will get him in the morning.   {Document critical care time when appropriate:1} {Document review of labs and clinical decision tools ie heart score, Chads2Vasc2 etc:1}  {Document your independent review of radiology images, and any outside records:1} {Document your discussion with family members, caretakers, and with consultants:1} {Document social determinants of health affecting pt's care:1} {Document your decision making why or why not admission, treatments were needed:1} Final Clinical Impression(s) / ED Diagnoses Final diagnoses:  None    Rx / DC Orders ED Discharge Orders     None

## 2023-03-06 NOTE — ED Triage Notes (Signed)
 Pt c/o lung pain and states he feels like he going into CHF.

## 2023-03-07 DIAGNOSIS — Z794 Long term (current) use of insulin: Secondary | ICD-10-CM | POA: Diagnosis not present

## 2023-03-07 DIAGNOSIS — I5020 Unspecified systolic (congestive) heart failure: Secondary | ICD-10-CM | POA: Diagnosis not present

## 2023-03-07 DIAGNOSIS — I428 Other cardiomyopathies: Secondary | ICD-10-CM | POA: Diagnosis present

## 2023-03-07 DIAGNOSIS — N186 End stage renal disease: Secondary | ICD-10-CM

## 2023-03-07 DIAGNOSIS — Z961 Presence of intraocular lens: Secondary | ICD-10-CM | POA: Diagnosis present

## 2023-03-07 DIAGNOSIS — I480 Paroxysmal atrial fibrillation: Secondary | ICD-10-CM | POA: Diagnosis present

## 2023-03-07 DIAGNOSIS — E875 Hyperkalemia: Secondary | ICD-10-CM | POA: Diagnosis present

## 2023-03-07 DIAGNOSIS — J9601 Acute respiratory failure with hypoxia: Secondary | ICD-10-CM | POA: Diagnosis present

## 2023-03-07 DIAGNOSIS — E782 Mixed hyperlipidemia: Secondary | ICD-10-CM | POA: Diagnosis present

## 2023-03-07 DIAGNOSIS — R0602 Shortness of breath: Secondary | ICD-10-CM | POA: Diagnosis present

## 2023-03-07 DIAGNOSIS — R079 Chest pain, unspecified: Secondary | ICD-10-CM | POA: Diagnosis not present

## 2023-03-07 DIAGNOSIS — Z96651 Presence of right artificial knee joint: Secondary | ICD-10-CM | POA: Diagnosis present

## 2023-03-07 DIAGNOSIS — E8779 Other fluid overload: Secondary | ICD-10-CM

## 2023-03-07 DIAGNOSIS — J449 Chronic obstructive pulmonary disease, unspecified: Secondary | ICD-10-CM | POA: Diagnosis present

## 2023-03-07 DIAGNOSIS — I132 Hypertensive heart and chronic kidney disease with heart failure and with stage 5 chronic kidney disease, or end stage renal disease: Secondary | ICD-10-CM | POA: Diagnosis present

## 2023-03-07 DIAGNOSIS — E1165 Type 2 diabetes mellitus with hyperglycemia: Secondary | ICD-10-CM | POA: Diagnosis present

## 2023-03-07 DIAGNOSIS — Z8616 Personal history of COVID-19: Secondary | ICD-10-CM | POA: Diagnosis not present

## 2023-03-07 DIAGNOSIS — E1122 Type 2 diabetes mellitus with diabetic chronic kidney disease: Secondary | ICD-10-CM | POA: Diagnosis present

## 2023-03-07 DIAGNOSIS — Z992 Dependence on renal dialysis: Secondary | ICD-10-CM | POA: Diagnosis not present

## 2023-03-07 DIAGNOSIS — D631 Anemia in chronic kidney disease: Secondary | ICD-10-CM | POA: Diagnosis not present

## 2023-03-07 DIAGNOSIS — E1151 Type 2 diabetes mellitus with diabetic peripheral angiopathy without gangrene: Secondary | ICD-10-CM | POA: Diagnosis present

## 2023-03-07 DIAGNOSIS — Z89512 Acquired absence of left leg below knee: Secondary | ICD-10-CM | POA: Diagnosis not present

## 2023-03-07 DIAGNOSIS — E1140 Type 2 diabetes mellitus with diabetic neuropathy, unspecified: Secondary | ICD-10-CM | POA: Diagnosis present

## 2023-03-07 DIAGNOSIS — J309 Allergic rhinitis, unspecified: Secondary | ICD-10-CM | POA: Diagnosis present

## 2023-03-07 DIAGNOSIS — I2489 Other forms of acute ischemic heart disease: Secondary | ICD-10-CM | POA: Diagnosis not present

## 2023-03-07 DIAGNOSIS — I953 Hypotension of hemodialysis: Secondary | ICD-10-CM | POA: Diagnosis present

## 2023-03-07 DIAGNOSIS — Z89511 Acquired absence of right leg below knee: Secondary | ICD-10-CM | POA: Diagnosis not present

## 2023-03-07 DIAGNOSIS — I5022 Chronic systolic (congestive) heart failure: Secondary | ICD-10-CM

## 2023-03-07 DIAGNOSIS — D649 Anemia, unspecified: Secondary | ICD-10-CM | POA: Diagnosis present

## 2023-03-07 DIAGNOSIS — Z1152 Encounter for screening for COVID-19: Secondary | ICD-10-CM | POA: Diagnosis not present

## 2023-03-07 LAB — BLOOD GAS, ARTERIAL
Acid-base deficit: 7.2 mmol/L — ABNORMAL HIGH (ref 0.0–2.0)
Bicarbonate: 17.9 mmol/L — ABNORMAL LOW (ref 20.0–28.0)
Drawn by: 28340
FIO2: 36 %
O2 Saturation: 69.2 %
Patient temperature: 37
pCO2 arterial: 34 mm[Hg] (ref 32–48)
pH, Arterial: 7.33 — ABNORMAL LOW (ref 7.35–7.45)
pO2, Arterial: 41 mm[Hg] — ABNORMAL LOW (ref 83–108)

## 2023-03-07 LAB — RESP PANEL BY RT-PCR (RSV, FLU A&B, COVID)  RVPGX2
Influenza A by PCR: NEGATIVE
Influenza B by PCR: NEGATIVE
Resp Syncytial Virus by PCR: NEGATIVE
SARS Coronavirus 2 by RT PCR: NEGATIVE

## 2023-03-07 LAB — RESPIRATORY PANEL BY PCR

## 2023-03-07 LAB — HEPATITIS B SURFACE ANTIGEN: Hepatitis B Surface Ag: NONREACTIVE

## 2023-03-07 LAB — PROCALCITONIN: Procalcitonin: 0.42 ng/mL

## 2023-03-07 LAB — MRSA NEXT GEN BY PCR, NASAL: MRSA by PCR Next Gen: NOT DETECTED

## 2023-03-07 LAB — D-DIMER, QUANTITATIVE: D-Dimer, Quant: 0.51 ug{FEU}/mL — ABNORMAL HIGH (ref 0.00–0.50)

## 2023-03-07 MED ORDER — CHLORHEXIDINE GLUCONATE CLOTH 2 % EX PADS
6.0000 | MEDICATED_PAD | Freq: Every day | CUTANEOUS | Status: DC
  Administered 2023-03-07 – 2023-03-08 (×2): 6 via TOPICAL

## 2023-03-07 MED ORDER — ACETAMINOPHEN 650 MG RE SUPP: 650.0000 mg | Freq: Four times a day (QID) | RECTAL | Status: DC | PRN

## 2023-03-07 MED ORDER — HEPARIN SODIUM (PORCINE) 5000 UNIT/ML IJ SOLN
5000.0000 [IU] | Freq: Three times a day (TID) | INTRAMUSCULAR | Status: DC
  Administered 2023-03-07 – 2023-03-08 (×4): 5000 [IU] via SUBCUTANEOUS
  Filled 2023-03-07 (×4): qty 1

## 2023-03-07 MED ORDER — ONDANSETRON HCL 4 MG/2ML IJ SOLN: 4.0000 mg | Freq: Four times a day (QID) | INTRAMUSCULAR | Status: DC | PRN

## 2023-03-07 MED ORDER — METHYLPREDNISOLONE SODIUM SUCC 125 MG IJ SOLR
125.0000 mg | Freq: Once | INTRAMUSCULAR | Status: AC
  Administered 2023-03-07: 125 mg via INTRAVENOUS
  Filled 2023-03-07: qty 2

## 2023-03-07 MED ORDER — ACETAMINOPHEN 325 MG PO TABS: 650.0000 mg | ORAL_TABLET | Freq: Four times a day (QID) | ORAL | Status: DC | PRN

## 2023-03-07 MED ORDER — MIDODRINE HCL 5 MG PO TABS
10.0000 mg | ORAL_TABLET | Freq: Three times a day (TID) | ORAL | Status: DC
  Administered 2023-03-07: 10 mg via ORAL
  Filled 2023-03-07: qty 2

## 2023-03-07 MED ORDER — IPRATROPIUM-ALBUTEROL 0.5-2.5 (3) MG/3ML IN SOLN
3.0000 mL | RESPIRATORY_TRACT | Status: AC
  Administered 2023-03-07 (×3): 3 mL via RESPIRATORY_TRACT
  Filled 2023-03-07: qty 6
  Filled 2023-03-07: qty 3

## 2023-03-07 MED ORDER — SODIUM ZIRCONIUM CYCLOSILICATE 5 G PO PACK
10.0000 g | PACK | Freq: Once | ORAL | Status: AC
  Administered 2023-03-07: 10 g via ORAL
  Filled 2023-03-07: qty 2

## 2023-03-07 MED ORDER — ONDANSETRON HCL 4 MG PO TABS: 4.0000 mg | ORAL_TABLET | Freq: Four times a day (QID) | ORAL | Status: DC | PRN

## 2023-03-07 NOTE — Plan of Care (Signed)

## 2023-03-07 NOTE — Hospital Course (Addendum)
 Anthony Bullock is a 63 year old male with a history of hypertension, hyperlipidemia, diabetes mellitus type 2, bilateral BKA, ESRD (TTS),HFrEF (EF 30-35%), anxiety/depression presenting with cough and shortness of breath that started fairly acutely around 7 PM on 03/06/2023.  The patient endorses compliance with dialysis.  He has not been getting his dialysis session short.  Notably, the patient has had recent hospital admissions for fluid overload requiring dialysis on 09/24/2022 as well as from 09/06/2022 to 09/09/22. Notably, the patient had a recent hospital admission from 08/18/2022 to 08/26/2022 for acute respiratory failure secondary to fluid overload.   He did have some chest discomfort that also developed around 7 PM on 03/06/2023. In the ED, the patient was afebrile and tachycardic in 110-120.  He was hypoxic saturation 87-88% room air.  He was placed on BiPAP.  WBC 12.5, hemoglobin 11.8, platelets 205.  Sodium 132, potassium 5.4, bicarbonate 16, serum creatinine 12.24.  Chest x-ray showed some mild increased interstitial markings.  Nephrology was consulted to assist with dialysis.  Assessment and Plan:  Acute respiratory failure with hypoxia -COVID-19 PCR negative -With the patient's acute onset, and nearly unremarkable chest x-ray--, unclear if fluid overload is entirely etiology -D-dimer--normal for age, low suspicion for PE as pt already on [facility] -Stable on BiPAP -Wean oxygen for sats greater 92% -PCT 0.42   Fluid overload -Nephrology consulted - S/P HD    ESRD -Patient dialyzes Tuesday, Thursday, Saturday -Nephrology consulted to assist   chronic HFrEF -Continue carvedilol -His soft BP has limited GDMT -08/22/2022 echo EF 30-35%, grade 2 DD, normal RV function, moderate MR -11/29/2022 echo EF 40%, grade 1 DD, normal RVF   Paroxysmal atrial fibrillation -Currently in sinus rhythm -Continue apixaban -Continue carvedilol   Coronary artery disease -No chest pain presently -Last  tress test on 08/25/2022 which showed a prior infarct consistent with prior left circumflex infarct but no ischemia noted.  -Continue apixaban and carvedilol   Uncontrolled diabetes mellitus type 2 with hyperglycemia -06/24/2022 hemoglobin A1c 8.7 -12/21/2022 hemoglobin A1c at 7.3 -NovoLog sliding scale -He is on Lantus 14 units at bedtime at home   COPD -Continue bronchodilator therapy -02/01/2023 PFTs--minimal obstructive disease, severe diffusion defect, moderate restriction suggesting parenchymal process -follow up Dr. Wynona Neat   PAD status post bilateral BKA -Patient with prosthesis for ambulation. -Patient initially was on aspirin and heparin and subsequently aspirin discontinued and patient transition to Eliquis.  Patient maintained on home regimen of fenofibrate and atorvastatin.   Hypertension -Continue carvedilol   Mixed hyperlipidemia -Continue statin

## 2023-03-07 NOTE — Consult Note (Signed)
 Reason for Consult: Acute hypoxic respiratory failure in patient with ESRD Referring Physician: Catarina Hartshorn, MD [facility])  HPI:  63 year old man with past medical history significant for hypertension, type 2 diabetes mellitus associated with neuropathy, history of bilateral BKA, congestive heart failure with reduced ejection fraction (30-35%), atrial fibrillation, dyslipidemia and end-stage renal disease on hemodialysis (TTS).  Brought to the emergency room overnight with increasing shortness of breath and cough.  He has been truncating his dialysis treatments and possibly not getting to his estimated dry weight; elevated BUN further pointing to inadequate dialysis as an outpatient.  He is admitted with acute respiratory failure and is currently on BiPAP.  He denies any fevers or chills and has not had any hemoptysis.  Denies any preceding nausea, vomiting or diarrhea.  Past Medical History:  Diagnosis Date   AICD (automatic cardioverter/defibrillator) present    [employer]   Allergic rhinitis    Anemia    Arthritis    Chronic systolic heart failure (HCC)    a. ECHO (12/2012) EF 25-30%, HK entireanteroseptal myocardium //  b.  EF 25%, diffuse HK, grade 1 diastolic dysfunction, MAC, mild LAE, normal RVSF, trivial pericardial effusion   COPD (chronic obstructive pulmonary disease) (HCC)    Diabetes mellitus type II    Diabetic nephropathy (HCC)    Diabetic neuropathy (HCC)    ESRD on hemodialysis (HCC)    started HD June 2017, goes to [facility] HD unit, Dr Fausto Skillern   History of cardiac catheterization    a.Myoview 1/15:  There is significant left ventricular dysfunction. There may be slight scar at the apex. There is no significant ischemia. LV Ejection Fraction: 27%  //  b. RHC/LHC (1/15) with mean RA 6, PA 47/22 mean 33, mean PCWP 20, PVR 2.5 WU, CI 2.5; 80% dLAD stenosis, 70% diffuse large D.     History of kidney stones    Hyperlipidemia    Hypertension    Kidney stones    NICM  (nonischemic cardiomyopathy) (HCC)    Primarily nonischemic.  Echo (12/14) with EF 25-30%.  Echo (3/15) with EF 25%, mild to moderately dilated LV, normal RV size and systolic function.     Osteomyelitis (HCC)    left fifth ray   Pneumonia    Urethral stricture    Wears glasses     Past Surgical History:  Procedure Laterality Date   ABDOMINAL AORTOGRAM W/LOWER EXTREMITY N/A 03/30/2016   Procedure: Abdominal Aortogram w/Lower Extremity;  Surgeon: Chuck Hint, MD;  Location: [facility] INVASIVE CV LAB;  Service: Cardiovascular;  Laterality: N/A;   AMPUTATION Right 04/26/2016   Procedure: Right Below Knee Amputation;  Surgeon: Nadara Mustard, MD;  Location: [facility] OR;  Service: Orthopedics;  Laterality: Right;   AMPUTATION Left 08/21/2019   Procedure: LEFT FOOT 5TH RAY AMPUTATION;  Surgeon: Nadara Mustard, MD;  Location: [facility] OR;  Service: Orthopedics;  Laterality: Left;   AMPUTATION Left 11/13/2019   Procedure: LEFT BELOW KNEE AMPUTATION;  Surgeon: Nadara Mustard, MD;  Location: [facility] OR;  Service: Orthopedics;  Laterality: Left;   AV FISTULA PLACEMENT Right 09/08/2015   Procedure: INSERTION OF 4-2mm x 45cm  ARTERIOVENOUS (AV) GORE-TEX GRAFT RIGHT UPPER  ARM;  Surgeon: Chuck Hint, MD;  Location: [facility] OR;  Service: Vascular;  Laterality: Right;   AV FISTULA PLACEMENT Left 01/14/2016   Procedure: CREATION OF LEFT UPPER ARM ARTERIOVENOUS FISTULA;  Surgeon: Chuck Hint, MD;  Location: [facility] OR;  Service: Vascular;  Laterality: Left;  BASCILIC VEIN TRANSPOSITION Right 08/22/2014   Procedure: RIGHT UPPER ARM BASCILIC VEIN TRANSPOSITION;  Surgeon: Chuck Hint, MD;  Location: [facility] OR;  Service: Vascular;  Laterality: Right;   BELOW KNEE LEG AMPUTATION Right 04/26/2016   CARDIAC CATHETERIZATION     CARDIAC DEFIBRILLATOR PLACEMENT  06/27/2013   Sub Q       BY DR Graciela Husbands   CATARACT EXTRACTION W/PHACO Right 08/06/2018   Procedure: CATARACT EXTRACTION PHACO AND INTRAOCULAR LENS PLACEMENT (IOC);   Surgeon: Fabio Pierce, MD;  Location: [facility] ORS;  Service: Ophthalmology;  Laterality: Right;  CDE: 4.06   CATARACT EXTRACTION W/PHACO Left 08/20/2018   Procedure: CATARACT EXTRACTION PHACO AND INTRAOCULAR LENS PLACEMENT (IOC);  Surgeon: Fabio Pierce, MD;  Location: [facility] ORS;  Service: Ophthalmology;  Laterality: Left;  CDE: 6.76   COLONOSCOPY WITH PROPOFOL N/A 07/22/2015   Procedure: COLONOSCOPY WITH PROPOFOL;  Surgeon: Sherrilyn Rist, MD;  Location: [facility] ENDOSCOPY;  Service: Gastroenterology;  Laterality: N/A;   FEMORAL-POPLITEAL BYPASS GRAFT Right 03/31/2016   Procedure: BYPASS GRAFT FEMORAL-POPLITEAL ARTERY USING RIGHT GREATER SAPHENOUS NONREVERSED VEIN;  Surgeon: Chuck Hint, MD;  Location: [facility] OR;  Service: Vascular;  Laterality: Right;   HERNIA REPAIR     I & D EXTREMITY Right 03/31/2016   Procedure: IRRIGATION AND DEBRIDEMENT FOOT;  Surgeon: Chuck Hint, MD;  Location: [facility] OR;  Service: Vascular;  Laterality: Right;   IMPLANTABLE CARDIOVERTER DEFIBRILLATOR IMPLANT N/A 06/27/2013   Procedure: SUB Q ICD;  Surgeon: Duke Salvia, MD;  Location: [facility] CATH LAB;  Service: Cardiovascular;  Laterality: N/A;   INTRAOPERATIVE ARTERIOGRAM Right 03/31/2016   Procedure: INTRA OPERATIVE ARTERIOGRAM;  Surgeon: Chuck Hint, MD;  Location: [facility] OR;  Service: Vascular;  Laterality: Right;   IR GENERIC HISTORICAL Right 11/30/2015   IR THROMBECTOMY AV FISTULA W/THROMBOLYSIS/PTA INC/SHUNT/IMG RIGHT 11/30/2015 Irish Lack, MD [facility]   IR GENERIC HISTORICAL  11/30/2015   IR US GUIDE VASC ACCESS RIGHT 11/30/2015 Irish Lack, MD [facility]   IR GENERIC HISTORICAL Right 12/15/2015   IR THROMBECTOMY AV FISTULA W/THROMBOLYSIS/PTA/STENT INC/SHUNT/IMG RT 12/15/2015 Oley Balm, MD [facility]   IR GENERIC HISTORICAL  12/15/2015   IR US GUIDE VASC ACCESS RIGHT 12/15/2015 Oley Balm, MD [facility]   IR GENERIC HISTORICAL  12/28/2015   IR FLUORO GUIDE CV LINE RIGHT 12/28/2015  Jolaine Click, MD [facility]   IR GENERIC HISTORICAL  12/28/2015   IR US GUIDE VASC ACCESS RIGHT 12/28/2015 Jolaine Click, MD [facility]   LEFT A ND RIGHT HEART CATH  01/30/2013   DR Jones Broom   LEFT AND RIGHT HEART CATHETERIZATION WITH CORONARY ANGIOGRAM N/A 01/30/2013   Procedure: LEFT AND RIGHT HEART CATHETERIZATION WITH CORONARY ANGIOGRAM;  Surgeon: Dolores Patty, MD;  Location: [facility] CATH LAB;  Service: Cardiovascular;  Laterality: N/A;   PERIPHERAL VASCULAR CATHETERIZATION Right 01/26/2015   Procedure: A/V Fistulagram;  Surgeon: Chuck Hint, MD;  Location: [facility] INVASIVE CV LAB;  Service: Cardiovascular;  Laterality: Right;   reapea urethral surgery for recurrent obstruction  2011   TOTAL KNEE ARTHROPLASTY Right 2007   VEIN HARVEST Right 03/31/2016   Procedure: RIGHT GREATER SAPHENOUS VEIN HARVEST;  Surgeon: Chuck Hint, MD;  Location: [facility] OR;  Service: Vascular;  Laterality: Right;    Family History  Problem Relation Age of Onset   Bladder Cancer Mother    Alcohol abuse Father    Melanoma Father    Stroke Maternal Grandmother    Heart Problems Maternal Grandmother  unknown   Diabetes Maternal Grandmother    Heart disease Maternal Grandfather    Prostate cancer Maternal Grandfather     Social History:  reports that he quit smoking about 12 years ago. His smoking use included cigarettes. He started smoking about 44 years ago. He has a 64 pack-year smoking history. He has never used smokeless tobacco. He reports that he does not drink alcohol and does not use drugs.  Allergies:  Allergies  Allergen Reactions   Epoetin Alfa Other (See Comments)    unknown   Ferumoxytol Other (See Comments)    unknown   Morphine Sulfate Rash and Other (See Comments)    Itches all over, red spots    Medications: I have reviewed the patient's current medications. Scheduled:  Chlorhexidine Gluconate Cloth  6 each Topical Q0600   heparin  5,000 Units Subcutaneous Q8H    Continuous:  Results for orders placed or performed during the hospital encounter of 03/06/23 (from the past 48 hours)  Comprehensive metabolic panel     Status: Abnormal   Collection Time: 03/06/23  9:36 PM  Result Value Ref Range   Sodium 132 (L) 135 - 145 mmol/L   Potassium 5.4 (H) 3.5 - 5.1 mmol/L   Chloride 96 (L) 98 - 111 mmol/L   CO2 16 (L) 22 - 32 mmol/L   Glucose, Bld 178 (H) 70 - 99 mg/dL    Comment: Glucose reference range applies only to samples taken after fasting for at least 8 hours.   BUN 103 (H) 8 - 23 mg/dL    Comment: RESULTS CONFIRMED BY MANUAL DILUTION   Creatinine, Ser 12.24 (H) 0.61 - 1.24 mg/dL   Calcium 9.1 8.9 - 16.1 mg/dL   Total Protein 7.5 6.5 - 8.1 g/dL   Albumin 3.2 (L) 3.5 - 5.0 g/dL   AST 27 15 - 41 U/L   ALT 28 0 - 44 U/L   Alkaline Phosphatase 95 38 - 126 U/L   Total Bilirubin 0.7 0.0 - 1.2 mg/dL   GFR, Estimated 4 (L) >60 mL/min    Comment: (NOTE) Calculated using the CKD-EPI Creatinine Equation (2021)    Anion gap 20 (H) 5 - 15    Comment: Performed at [facility], [address] [place], [place] [address]  CBC with Differential     Status: Abnormal   Collection Time: 03/06/23  9:36 PM  Result Value Ref Range   WBC 11.5 (H) 4.0 - 10.5 K/uL   RBC 4.03 (L) 4.22 - 5.81 MIL/uL   Hemoglobin 11.8 (L) 13.0 - 17.0 g/dL   HCT 54.0 (L) 98.1 - 19.1 %   MCV 90.1 80.0 - 100.0 fL   MCH 29.3 26.0 - 34.0 pg   MCHC 32.5 30.0 - 36.0 g/dL   RDW 47.8 29.5 - 62.1 %   Platelets 205 150 - 400 K/uL   nRBC 0.0 0.0 - 0.2 %   Neutrophils Relative % 75 %   Neutro Abs 8.6 (H) 1.7 - 7.7 K/uL   Lymphocytes Relative 11 %   Lymphs Abs 1.2 0.7 - 4.0 K/uL   Monocytes Relative 10 %   Monocytes Absolute 1.1 (H) 0.1 - 1.0 K/uL   Eosinophils Relative 3 %   Eosinophils Absolute 0.4 0.0 - 0.5 K/uL   Basophils Relative 1 %   Basophils Absolute 0.1 0.0 - 0.1 K/uL   Immature Granulocytes 0 %   Abs Immature Granulocytes 0.04 0.00 - 0.07 K/uL    Comment: Performed at  [employer]  [facility], [address], [place], [place] [address]  Troponin I (High Sensitivity)     Status: Abnormal   Collection Time: 03/06/23  9:36 PM  Result Value Ref Range   Troponin I (High Sensitivity) 44 (H) <18 ng/L    Comment: (NOTE) Elevated high sensitivity troponin I (hsTnI) values and significant  changes across serial measurements may suggest ACS but many other  chronic and acute conditions are known to elevate hsTnI results.  Refer to the "Links" section for chest pain algorithms and additional  guidance. Performed at [facility], [address], [place], [place] [address]   Brain natriuretic peptide     Status: Abnormal   Collection Time: 03/06/23  9:36 PM  Result Value Ref Range   B Natriuretic Peptide 576.0 (H) 0.0 - 100.0 pg/mL    Comment: Performed at [facility], [address], [place], [place] [address]  Troponin I (High Sensitivity)     Status: Abnormal   Collection Time: 03/06/23 11:17 PM  Result Value Ref Range   Troponin I (High Sensitivity) 53 (H) <18 ng/L    Comment: (NOTE) Elevated high sensitivity troponin I (hsTnI) values and significant  changes across serial measurements may suggest ACS but many other  chronic and acute conditions are known to elevate hsTnI results.  Refer to the "Links" section for chest pain algorithms and additional  guidance. Performed at [facility], [address], [place], [place] [address]   Resp panel by RT-PCR (RSV, Flu A&B, Covid) Anterior Nasal Swab     Status: None   Collection Time: 03/06/23 11:20 PM   Specimen: Anterior Nasal Swab  Result Value Ref Range   SARS Coronavirus 2 by RT PCR NEGATIVE NEGATIVE    Comment: (NOTE) SARS-CoV-2 target nucleic acids are NOT DETECTED.  The SARS-CoV-2 RNA is generally detectable in upper respiratory specimens during the acute phase of infection. The lowest concentration of SARS-CoV-2 viral copies this assay can detect is 138 copies/mL. A negative result does not preclude  SARS-Cov-2 infection and should not be used as the sole basis for treatment or other patient management decisions. A negative result may occur with  improper specimen collection/handling, submission of specimen other than nasopharyngeal swab, presence of viral mutation(s) within the areas targeted by this assay, and inadequate number of viral copies(<138 copies/mL). A negative result must be combined with clinical observations, patient history, and epidemiological information. The expected result is Negative.  Fact Sheet for Patients:  BloggerCourse.com  Fact Sheet for Healthcare Providers:  SeriousBroker.it  This test is no t yet approved or cleared by the [place] FDA and  has been authorized for detection and/or diagnosis of SARS-CoV-2 by FDA under an Emergency Use Authorization (EUA). This EUA will remain  in effect (meaning this test can be used) for the duration of the COVID-19 declaration under Section 564(b)(1) of the Act, 21 U.S.C.section 360bbb-3(b)(1), unless the authorization is terminated  or revoked sooner.       Influenza A by PCR NEGATIVE NEGATIVE   Influenza B by PCR NEGATIVE NEGATIVE    Comment: (NOTE) The Xpert Xpress SARS-CoV-2/FLU/RSV plus assay is intended as an aid in the diagnosis of influenza from Nasopharyngeal swab specimens and should not be used as a sole basis for treatment. Nasal washings and aspirates are unacceptable for Xpert Xpress SARS-CoV-2/FLU/RSV testing.  Fact Sheet for Patients: BloggerCourse.com  Fact Sheet for Healthcare Providers: SeriousBroker.it  This test is not yet approved or cleared by the [place] FDA and has been  authorized for detection and/or diagnosis of SARS-CoV-2 by FDA under an Emergency Use Authorization (EUA). This EUA will remain in effect (meaning this test can be used) for the duration of the COVID-19  declaration under Section 564(b)(1) of the Act, 21 U.S.C. section 360bbb-3(b)(1), unless the authorization is terminated or revoked.     Resp Syncytial Virus by PCR NEGATIVE NEGATIVE    Comment: (NOTE) Fact Sheet for Patients: BloggerCourse.com  Fact Sheet for Healthcare Providers: SeriousBroker.it  This test is not yet approved or cleared by the [place] FDA and has been authorized for detection and/or diagnosis of SARS-CoV-2 by FDA under an Emergency Use Authorization (EUA). This EUA will remain in effect (meaning this test can be used) for the duration of the COVID-19 declaration under Section 564(b)(1) of the Act, 21 U.S.C. section 360bbb-3(b)(1), unless the authorization is terminated or revoked.  Performed at [facility], [address] [place], [place] [address]   Blood gas, arterial (at [facility])     Status: Abnormal   Collection Time: 03/07/23 12:06 AM  Result Value Ref Range   FIO2 36.0 %   pH, Arterial 7.33 (L) 7.35 - 7.45   pCO2 arterial 34 32 - 48 mmHg   pO2, Arterial 41 (L) 83 - 108 mmHg   Bicarbonate 17.9 (L) 20.0 - 28.0 mmol/L   Acid-base deficit 7.2 (H) 0.0 - 2.0 mmol/L   O2 Saturation 69.2 %   Patient temperature 37.0    Collection site RIGHT RADIAL    Drawn by [id number]    Allens test (pass/fail) PASS PASS    Comment: Performed at [facility], [address], [place], [place] [address]   *Note: Due to a large number of results and/or encounters for the requested time period, some results have not been displayed. A complete set of results can be found in Results Review.    DG Chest 2 View Result Date: 03/06/2023 CLINICAL DATA:  Chest pain, stomach bloating, sore spot on the right side. EXAM: CHEST - 2 VIEW COMPARISON:  09/24/2022 FINDINGS: Right central venous catheter with tip over the cavoatrial junction region. Cardiac defibrillator. Mild cardiac enlargement. Linear scarring in the lung bases. No  airspace disease or consolidation in the lungs. No pleural effusion or pneumothorax. Mediastinal contours appear intact. Calcification of the aorta. IMPRESSION: Appliances are unchanged in position.  Lungs are clear today. Electronically Signed   By: Burman Nieves M.D.   On: 03/06/2023 23:16    Review of Systems  Constitutional:  Positive for fatigue. Negative for chills and fever.  HENT:  Negative for nosebleeds, sinus pressure and sore throat.   Eyes:  Negative for photophobia and visual disturbance.  Respiratory:  Positive for cough, chest tightness and shortness of breath. Negative for wheezing.   Cardiovascular:  Negative for chest pain.  Gastrointestinal:  Negative for diarrhea, nausea and vomiting.  Skin:  Negative for rash and wound.  Neurological:  Negative for dizziness, weakness and headaches.   Blood pressure (!) 165/43, pulse 99, temperature 97.9 F (36.6 C), temperature source Axillary, resp. rate 16, height 5\' 10"  (1.778 m), weight 93 kg, SpO2 100%. Physical Exam Vitals and nursing note reviewed.  Constitutional:      Appearance: He is well-developed and normal weight. He is ill-appearing.     Comments: On BiPAP  HENT:     Head: Normocephalic.  Eyes:     Extraocular Movements: Extraocular movements intact.  Neck:     Vascular: JVD present.  Cardiovascular:  Rate and Rhythm: Normal rate and regular rhythm.     Heart sounds: Normal heart sounds.     Comments: Right IJ TDC Pulmonary:     Effort: Accessory muscle usage present. No tachypnea.     Breath sounds: Examination of the right-lower field reveals decreased breath sounds. Examination of the left-lower field reveals decreased breath sounds. Decreased breath sounds present.     Comments: On BiPAP Abdominal:     General: Bowel sounds are normal.  Musculoskeletal:     Cervical back: Neck supple.     Comments: Status post bilateral BKA.  Thrombosed right upper arm AV graft  Skin:    General: Skin is warm and  dry.  Neurological:     Mental Status: He is alert.     Assessment/Plan: 1.  Acute hypoxic respiratory failure: Currently on BiPAP.  This is likely secondary to volume overload from inadequate dialysis/truncated dialysis treatments.  Will undertake dialysis this morning for volume unloading on his routine schedule. 2.  End-stage renal disease: Continue dialysis on TTS schedule with urgent treatment ordered for today for volume unloading. 3.  Hypertension: Likely exacerbated by volume status/respiratory distress, monitor with ultrafiltration/hemodialysis. 4.  Anemia: Hemoglobin and hematocrit currently at goal, continue to monitor during hospitalization. 5.  Hyperkalemia: Secondary to cumulative effect of inadequate dialysis, monitor with HD today.  Anthony Bullock 03/07/2023, 8:18 AM

## 2023-03-07 NOTE — ED Notes (Signed)
 Pt's sats continue to be low and he continues to ask for BIPAP. RT at beside. Attending in agreement BIPAP may help him feel better. RT to place him on it.

## 2023-03-07 NOTE — ED Notes (Signed)
 Report given to ICU

## 2023-03-07 NOTE — H&P (Addendum)
 History and Physical    Patient: Anthony Bullock QMV:[id number] DOB: 1960-09-03 DOA: 03/06/2023 DOS: the patient was seen and examined on 03/07/2023 PCP: Kristian Covey, MD  Patient coming from: Home  Chief Complaint:  Chief Complaint  Patient presents with   Chest Pain   HPI: ISACC Bullock is a  63 year old male with a history of hypertension, hyperlipidemia, diabetes mellitus type 2, bilateral BKA, ESRD (TTS),HFrEF (EF 30-35%), anxiety/depression presenting with cough and shortness of breath that started fairly acutely around 7 PM on 03/06/2023.  The patient endorses compliance with dialysis.  He has not been getting his dialysis session short.  Notably, the patient has had recent hospital admissions for fluid overload requiring dialysis on 09/24/2022 as well as from 09/06/2022 to 09/09/22. Notably, the patient had a recent hospital admission from 08/18/2022 to 08/26/2022 for acute respiratory failure secondary to fluid overload.   The patient denies any fevers, chills, headache, nausea, vomiting, diarrhea, abdominal pain.  He denies any hematochezia or melena.  He denies any hemoptysis.  He did have some chest discomfort that also developed around 7 PM on 03/06/2023. In the ED, the patient was afebrile and tachycardic in 110-120.  He was hypoxic saturation 87-88% room air.  He was placed on BiPAP.  WBC 12.5, hemoglobin 11.8, platelets 205.  Sodium 132, potassium 5.4, bicarbonate 16, serum creatinine 12.24.  Chest x-ray showed some mild increased interstitial markings.  Nephrology was consulted to assist with dialysis.  Review of Systems: As mentioned in the history of present illness. All other systems reviewed and are negative. Past Medical History:  Diagnosis Date   AICD (automatic cardioverter/defibrillator) present    [employer]   Allergic rhinitis    Anemia    Arthritis    Chronic systolic heart failure (HCC)    a. ECHO (12/2012) EF 25-30%, HK entireanteroseptal myocardium //  b.   EF 25%, diffuse HK, grade 1 diastolic dysfunction, MAC, mild LAE, normal RVSF, trivial pericardial effusion   COPD (chronic obstructive pulmonary disease) (HCC)    Diabetes mellitus type II    Diabetic nephropathy (HCC)    Diabetic neuropathy (HCC)    ESRD on hemodialysis (HCC)    started HD June 2017, goes to [facility] HD unit, Dr Fausto Skillern   History of cardiac catheterization    a.Myoview 1/15:  There is significant left ventricular dysfunction. There may be slight scar at the apex. There is no significant ischemia. LV Ejection Fraction: 27%  //  b. RHC/LHC (1/15) with mean RA 6, PA 47/22 mean 33, mean PCWP 20, PVR 2.5 WU, CI 2.5; 80% dLAD stenosis, 70% diffuse large D.     History of kidney stones    Hyperlipidemia    Hypertension    Kidney stones    NICM (nonischemic cardiomyopathy) (HCC)    Primarily nonischemic.  Echo (12/14) with EF 25-30%.  Echo (3/15) with EF 25%, mild to moderately dilated LV, normal RV size and systolic function.     Osteomyelitis (HCC)    left fifth ray   Pneumonia    Urethral stricture    Wears glasses    Past Surgical History:  Procedure Laterality Date   ABDOMINAL AORTOGRAM W/LOWER EXTREMITY N/A 03/30/2016   Procedure: Abdominal Aortogram w/Lower Extremity;  Surgeon: Chuck Hint, MD;  Location: [facility] INVASIVE CV LAB;  Service: Cardiovascular;  Laterality: N/A;   AMPUTATION Right 04/26/2016   Procedure: Right Below Knee Amputation;  Surgeon: Nadara Mustard, MD;  Location: [facility] OR;  Service: Orthopedics;  Laterality: Right;   AMPUTATION Left 08/21/2019   Procedure: LEFT FOOT 5TH RAY AMPUTATION;  Surgeon: Nadara Mustard, MD;  Location: [facility] OR;  Service: Orthopedics;  Laterality: Left;   AMPUTATION Left 11/13/2019   Procedure: LEFT BELOW KNEE AMPUTATION;  Surgeon: Nadara Mustard, MD;  Location: [facility] OR;  Service: Orthopedics;  Laterality: Left;   AV FISTULA PLACEMENT Right 09/08/2015   Procedure: INSERTION OF 4-55mm x 45cm  ARTERIOVENOUS (AV) GORE-TEX GRAFT  RIGHT UPPER  ARM;  Surgeon: Chuck Hint, MD;  Location: [facility] OR;  Service: Vascular;  Laterality: Right;   AV FISTULA PLACEMENT Left 01/14/2016   Procedure: CREATION OF LEFT UPPER ARM ARTERIOVENOUS FISTULA;  Surgeon: Chuck Hint, MD;  Location: [facility] OR;  Service: Vascular;  Laterality: Left;   BASCILIC VEIN TRANSPOSITION Right 08/22/2014   Procedure: RIGHT UPPER ARM BASCILIC VEIN TRANSPOSITION;  Surgeon: Chuck Hint, MD;  Location: [facility] OR;  Service: Vascular;  Laterality: Right;   BELOW KNEE LEG AMPUTATION Right 04/26/2016   CARDIAC CATHETERIZATION     CARDIAC DEFIBRILLATOR PLACEMENT  06/27/2013   Sub Q       BY DR Graciela Husbands   CATARACT EXTRACTION W/PHACO Right 08/06/2018   Procedure: CATARACT EXTRACTION PHACO AND INTRAOCULAR LENS PLACEMENT (IOC);  Surgeon: Fabio Pierce, MD;  Location: [facility] ORS;  Service: Ophthalmology;  Laterality: Right;  CDE: 4.06   CATARACT EXTRACTION W/PHACO Left 08/20/2018   Procedure: CATARACT EXTRACTION PHACO AND INTRAOCULAR LENS PLACEMENT (IOC);  Surgeon: Fabio Pierce, MD;  Location: [facility] ORS;  Service: Ophthalmology;  Laterality: Left;  CDE: 6.76   COLONOSCOPY WITH PROPOFOL N/A 07/22/2015   Procedure: COLONOSCOPY WITH PROPOFOL;  Surgeon: Sherrilyn Rist, MD;  Location: [facility] ENDOSCOPY;  Service: Gastroenterology;  Laterality: N/A;   FEMORAL-POPLITEAL BYPASS GRAFT Right 03/31/2016   Procedure: BYPASS GRAFT FEMORAL-POPLITEAL ARTERY USING RIGHT GREATER SAPHENOUS NONREVERSED VEIN;  Surgeon: Chuck Hint, MD;  Location: [facility] OR;  Service: Vascular;  Laterality: Right;   HERNIA REPAIR     I & D EXTREMITY Right 03/31/2016   Procedure: IRRIGATION AND DEBRIDEMENT FOOT;  Surgeon: Chuck Hint, MD;  Location: [facility] OR;  Service: Vascular;  Laterality: Right;   IMPLANTABLE CARDIOVERTER DEFIBRILLATOR IMPLANT N/A 06/27/2013   Procedure: SUB Q ICD;  Surgeon: Duke Salvia, MD;  Location: [facility] CATH LAB;  Service: Cardiovascular;  Laterality: N/A;   INTRAOPERATIVE  ARTERIOGRAM Right 03/31/2016   Procedure: INTRA OPERATIVE ARTERIOGRAM;  Surgeon: Chuck Hint, MD;  Location: [facility] OR;  Service: Vascular;  Laterality: Right;   IR GENERIC HISTORICAL Right 11/30/2015   IR THROMBECTOMY AV FISTULA W/THROMBOLYSIS/PTA INC/SHUNT/IMG RIGHT 11/30/2015 Irish Lack, MD [facility]   IR GENERIC HISTORICAL  11/30/2015   IR US GUIDE VASC ACCESS RIGHT 11/30/2015 Irish Lack, MD [facility]   IR GENERIC HISTORICAL Right 12/15/2015   IR THROMBECTOMY AV FISTULA W/THROMBOLYSIS/PTA/STENT INC/SHUNT/IMG RT 12/15/2015 Oley Balm, MD [facility]   IR GENERIC HISTORICAL  12/15/2015   IR US GUIDE VASC ACCESS RIGHT 12/15/2015 Oley Balm, MD [facility]   IR GENERIC HISTORICAL  12/28/2015   IR FLUORO GUIDE CV LINE RIGHT 12/28/2015 Jolaine Click, MD [facility]   IR GENERIC HISTORICAL  12/28/2015   IR US GUIDE VASC ACCESS RIGHT 12/28/2015 Jolaine Click, MD [facility]   LEFT A ND RIGHT HEART CATH  01/30/2013   DR Jones Broom   LEFT AND RIGHT HEART CATHETERIZATION WITH CORONARY ANGIOGRAM N/A 01/30/2013   Procedure: LEFT AND RIGHT HEART CATHETERIZATION WITH  CORONARY ANGIOGRAM;  Surgeon: Dolores Patty, MD;  Location: [facility] CATH LAB;  Service: Cardiovascular;  Laterality: N/A;   PERIPHERAL VASCULAR CATHETERIZATION Right 01/26/2015   Procedure: A/V Fistulagram;  Surgeon: Chuck Hint, MD;  Location: [facility] INVASIVE CV LAB;  Service: Cardiovascular;  Laterality: Right;   reapea urethral surgery for recurrent obstruction  2011   TOTAL KNEE ARTHROPLASTY Right 2007   VEIN HARVEST Right 03/31/2016   Procedure: RIGHT GREATER SAPHENOUS VEIN HARVEST;  Surgeon: Chuck Hint, MD;  Location: [facility] OR;  Service: Vascular;  Laterality: Right;   Social History:  reports that he quit smoking about 12 years ago. His smoking use included cigarettes. He started smoking about 44 years ago. He has a 64 pack-year smoking history. He has never used smokeless tobacco. He reports  that he does not drink alcohol and does not use drugs.  Allergies  Allergen Reactions   Epoetin Alfa Other (See Comments)    unknown   Ferumoxytol Other (See Comments)    unknown   Morphine Sulfate Rash and Other (See Comments)    Itches all over, red spots    Family History  Problem Relation Age of Onset   Bladder Cancer Mother    Alcohol abuse Father    Melanoma Father    Stroke Maternal Grandmother    Heart Problems Maternal Grandmother        unknown   Diabetes Maternal Grandmother    Heart disease Maternal Grandfather    Prostate cancer Maternal Grandfather     Prior to Admission medications   Medication Sig Start Date End Date Taking? Authorizing Provider  acetaminophen (TYLENOL) 325 MG tablet Take 1-2 tablets (325-650 mg total) by mouth every 4 (four) hours as needed for mild pain. 11/29/19   Love, Evlyn Kanner, PA-C  apixaban (ELIQUIS) 5 MG TABS tablet Take 1 tablet (5 mg total) by mouth 2 (two) times daily. 12/21/22   Burchette, Elberta Fortis, MD  atorvastatin (LIPITOR) 80 MG tablet TAKE 1 TABLET BY MOUTH AT BEDTIME Patient taking differently: Take 80 mg by mouth daily. 06/24/22   Burchette, Elberta Fortis, MD  azelastine (ASTELIN) 0.1 % nasal spray Place 2 sprays into both nostrils 3 (three) times daily as needed for rhinitis. Use in each nostril as directed 03/10/14   Burchette, Elberta Fortis, MD  carvedilol (COREG) 12.5 MG tablet Take 1 tablet (12.5 mg total) by mouth 2 (two) times daily. 01/25/23   Burchette, Elberta Fortis, MD  cinacalcet (SENSIPAR) 30 MG tablet Take 1 tablet (30 mg total) by mouth Every Tuesday,Thursday,and Saturday with dialysis. 11/30/19   Love, Evlyn Kanner, PA-C  Continuous Glucose Receiver (FREESTYLE LIBRE 3 READER) DEVI Use to check blood glucose TID 08/11/22   Burchette, Elberta Fortis, MD  Continuous Glucose Sensor (FREESTYLE LIBRE 3 PLUS SENSOR) MISC Change sensor every 15 days. Use as directed to check glucose daily 12/21/22   Burchette, Elberta Fortis, MD  fenofibrate 160 MG tablet TAKE 1  TABLET BY MOUTH ONCE DAILY Patient taking differently: Take 160 mg by mouth daily. 06/24/22   Burchette, Elberta Fortis, MD  fexofenadine (ALLEGRA) 180 MG tablet Take 180 mg by mouth daily as needed for allergies.    [provider]  gabapentin (NEURONTIN) 300 MG capsule TAKE 2 CAPSULES BY MOUTH TWICE DAILY . Patient taking differently: Take 600 mg by mouth 2 (two) times daily. 06/24/22   Burchette, Elberta Fortis, MD  glucose blood test strip Check 1 time daily. E11.9 One Touch Ultra Blue Test Strips 03/10/14  Burchette, Elberta Fortis, MD  HUMALOG KWIKPEN 200 UNIT/ML KwikPen INJECT A MAXIMUM OF 28 UNITS SUBCUTANEOUSLY TWICE DAILY WITH LUNCH AND SUPPER PER SLIDING SCALE. APPOINTMENT REQUIRED FOR FUTURE REFILLS 11/11/22   Burchette, Elberta Fortis, MD  Insulin Pen Needle (BD PEN NEEDLE NANO U/F) 32G X 4 MM MISC USE 1 PEN NEEDLE SUBCUTANEOUSLY WITH INSULIN 4 TIMES DAILY 12/04/19   Burchette, Elberta Fortis, MD  lanthanum (FOSRENOL) 500 MG chewable tablet Chew 500-1,000 mg by mouth See admin instructions. Take 1000 mg with meals three time a day and 500 mg with snacks    [provider]  LANTUS SOLOSTAR 100 UNIT/ML Solostar Pen INJECT 12 UNITS SUBCUTANEOUSLY AT BEDTIME Patient taking differently: Inject 14 Units into the skin at bedtime. 07/15/22   Burchette, Elberta Fortis, MD  midodrine (PROAMATINE) 10 MG tablet Take 10 mg by mouth 3 (three) times a week. EVERY TUESDAY, THURSDAY, AND SATURDAY 08/04/20   [provider]  multivitamin (RENA-VIT) TABS tablet Take 1 tablet by mouth once daily Patient taking differently: Take 1 tablet by mouth daily. 07/06/21   Burchette, Elberta Fortis, MD  Olopatadine HCl 0.2 % SOLN Place 1 drop into both eyes daily as needed (for allergies).     [provider]  tobramycin (TOBREX) 0.3 % ophthalmic solution Place 1 drop into the right eye every 6 (six) hours. 08/02/22   [provider]  VENTOLIN HFA 108 (90 Base) MCG/ACT inhaler Inhale 1-2 puffs into the lungs every 6 (six) hours  as needed for wheezing or shortness of breath. 07/25/22   Kristian Covey, MD    Physical Exam: Vitals:   03/07/23 0230 03/07/23 0245 03/07/23 0300 03/07/23 0449  BP: (!) 177/68 (!) 182/60 (!) 190/76 138/62  Pulse: (!) 105 (!) 101 (!) 106 99  Resp:    (!) 23  Temp:    98.3 F (36.8 C)  TempSrc:    Axillary  SpO2: (!) 88% (!) 87% 90% 99%  Weight:      Height:       GENERAL:  A&O x 3, NAD, well developed, cooperative, follows commands HEENT: [place]/AT, No thrush, No icterus, No oral ulcers Neck:  No neck mass, No meningismus, soft, supple CV: RRR, no S3, no S4, no rub, no JVD Lungs:  bibasilar crackles. No wheeze Abd: soft/NT +BS, nondistended Ext: bilateral BKA, no lymphangitis, no cyanosis, no rashes Neuro:  CN II-XII intact, strength 4/5 in RUE, RLE, strength 4/5 LUE, LLE; sensation intact bilateral; no dysmetria; babinski equivocal  Data Reviewed: Data reviewed above in history  Assessment and Plan: Acute respiratory failure with hypoxia -COVID-19 PCR negative -With the patient's acute onset, and nearly unremarkable chest x-ray--, unclear if fluid overload is entirely etiology -D-dimer--normal for age, low suspicion for PE as pt already on [facility] -Stable on BiPAP -Wean oxygen for sats greater 92% -PCT 0.42   Fluid overload -Nephrology consulted   ESRD -Patient dialyzes Tuesday, Thursday, Saturday -Nephrology consulted to assist   chronic HFrEF -Continue carvedilol -His soft BP has limited GDMT -08/22/2022 echo EF 30-35%, grade 2 DD, normal RV function, moderate MR -11/29/2022 echo EF 40%, grade 1 DD, normal RVF   Paroxysmal atrial fibrillation -Currently in sinus rhythm -Continue apixaban -Continue carvedilol   Coronary artery disease -No chest pain presently -The patient had a Myoview stress test on 08/25/2022 which showed a prior infarct consistent with prior left circumflex infarct but no ischemia noted.  -Continue apixaban and carvedilol   Uncontrolled  diabetes mellitus type 2 with  hyperglycemia -06/24/2022 hemoglobin A1c 8.7 -12/21/2022 hemoglobin A1c at 7.3 -NovoLog sliding scale -He is on Lantus 14 units at bedtime at home   COPD -Continue bronchodilator therapy -02/01/2023 PFTs--minimal obstructive disease, severe diffusion defect, moderate restriction suggesting parenchymal process -follow up Dr. Wynona Neat   PAD status post bilateral BKA -Patient with prosthesis for ambulation. -Patient initially was on aspirin and heparin and subsequently aspirin discontinued and patient transition to Eliquis.  Patient maintained on home regimen of fenofibrate and atorvastatin.   Hypertension -Continue carvedilol   Mixed hyperlipidemia -Continue statin     Advance Care Planning: FULL  Consults: renal  Family Communication: none  Severity of Illness: The appropriate patient status for this patient is INPATIENT. Inpatient status is judged to be reasonable and necessary in order to provide the required intensity of service to ensure the patient's safety. The patient's presenting symptoms, physical exam findings, and initial radiographic and laboratory data in the context of their chronic comorbidities is felt to place them at high risk for further clinical deterioration. Furthermore, it is not anticipated that the patient will be medically stable for discharge from the hospital within 2 midnights of admission.   * I certify that at the point of admission it is my clinical judgment that the patient will require inpatient hospital care spanning beyond 2 midnights from the point of admission due to high intensity of service, high risk for further deterioration and high frequency of surveillance required.*  Author: Catarina Hartshorn, MD 03/07/2023 7:53 AM  For on call review [email or web address].

## 2023-03-07 NOTE — Plan of Care (Signed)
  Problem: Education: Goal: Knowledge of General Education information will improve Description: Including pain rating scale, medication(s)/side effects and non-pharmacologic comfort measures Outcome: Progressing   Problem: Health Behavior/Discharge Planning: Goal: Ability to manage health-related needs will improve Outcome: Progressing   Problem: Clinical Measurements: Goal: Ability to maintain clinical measurements within normal limits will improve Outcome: Progressing Goal: Will remain free from infection Outcome: Progressing Goal: Diagnostic test results will improve Outcome: Progressing Goal: Respiratory complications will improve Outcome: Progressing Goal: Cardiovascular complication will be avoided Outcome: Progressing   Problem: Activity: Goal: Risk for activity intolerance will decrease Outcome: Not Progressing   Problem: Nutrition: Goal: Adequate nutrition will be maintained Outcome: Not Progressing   Problem: Coping: Goal: Level of anxiety will decrease Outcome: Progressing   Problem: Elimination: Goal: Will not experience complications related to bowel motility Outcome: Progressing Goal: Will not experience complications related to urinary retention Outcome: Progressing   Problem: Pain Managment: Goal: General experience of comfort will improve and/or be controlled Outcome: Progressing   Problem: Safety: Goal: Ability to remain free from injury will improve Outcome: Progressing   Problem: Skin Integrity: Goal: Risk for impaired skin integrity will decrease Outcome: Progressing

## 2023-03-07 NOTE — Progress Notes (Signed)
 Patient taken off of BiPAP and placed on 2L nasal cannula. BiPAP remains at bedside when needed. RN aware.

## 2023-03-07 NOTE — Progress Notes (Signed)
 Pt transported to ICU on Bipap without complications. Pt asking for Pressure to be increased on Bipap. Settings increased to 17/5 per pt comfort.

## 2023-03-07 NOTE — Progress Notes (Addendum)
 Pt tolerated tx well and goal met. Post dialysis weight is 84.5 kg.  03/07/23 1249  Vitals  Temp 97.8 F (36.6 C)  Temp Source Oral  BP (!) 106/59  BP Location Right Wrist  BP Method Automatic  Patient Position (if appropriate) Lying  Pulse Rate 97  ECG Heart Rate 97  Resp (!) 22  Oxygen Therapy  SpO2 97 %  O2 Device Nasal Cannula  O2 Flow Rate (L/min) 2 L/min  During Treatment Monitoring  Intra-Hemodialysis Comments Tx completed  Post Treatment  Dialyzer Clearance Lightly streaked  Hemodialysis Intake (mL) 0 mL  Liters Processed 62  Fluid Removed (mL) 3000 mL  Tolerated HD Treatment Yes  Post-Hemodialysis Comments Pt goal met.

## 2023-03-07 NOTE — Plan of Care (Signed)
  Problem: Education: Goal: Knowledge of General Education information will improve Description: Including pain rating scale, medication(s)/side effects and non-pharmacologic comfort measures Outcome: Progressing   Problem: Health Behavior/Discharge Planning: Goal: Ability to manage health-related needs will improve Outcome: Progressing   Problem: Clinical Measurements: Goal: Respiratory complications will improve Outcome: Progressing   

## 2023-03-08 DIAGNOSIS — Z992 Dependence on renal dialysis: Secondary | ICD-10-CM | POA: Diagnosis not present

## 2023-03-08 DIAGNOSIS — I5020 Unspecified systolic (congestive) heart failure: Secondary | ICD-10-CM

## 2023-03-08 DIAGNOSIS — R079 Chest pain, unspecified: Secondary | ICD-10-CM | POA: Diagnosis not present

## 2023-03-08 DIAGNOSIS — I2489 Other forms of acute ischemic heart disease: Secondary | ICD-10-CM

## 2023-03-08 DIAGNOSIS — N186 End stage renal disease: Secondary | ICD-10-CM | POA: Diagnosis not present

## 2023-03-08 LAB — CBC
HCT: 35.4 % — ABNORMAL LOW (ref 39.0–52.0)
Hemoglobin: 11.5 g/dL — ABNORMAL LOW (ref 13.0–17.0)
MCH: 29.2 pg (ref 26.0–34.0)
MCHC: 32.5 g/dL (ref 30.0–36.0)
MCV: 89.8 fL (ref 80.0–100.0)
Platelets: 202 10*3/uL (ref 150–400)
RBC: 3.94 MIL/uL — ABNORMAL LOW (ref 4.22–5.81)
RDW: 14.7 % (ref 11.5–15.5)
WBC: 12.8 10*3/uL — ABNORMAL HIGH (ref 4.0–10.5)
nRBC: 0 % (ref 0.0–0.2)

## 2023-03-08 LAB — COMPREHENSIVE METABOLIC PANEL
ALT: 29 U/L (ref 0–44)
AST: 47 U/L — ABNORMAL HIGH (ref 15–41)
Albumin: 3.1 g/dL — ABNORMAL LOW (ref 3.5–5.0)
Alkaline Phosphatase: 61 U/L (ref 38–126)
Anion gap: 18 — ABNORMAL HIGH (ref 5–15)
BUN: 87 mg/dL — ABNORMAL HIGH (ref 8–23)
CO2: 20 mmol/L — ABNORMAL LOW (ref 22–32)
Calcium: 8.9 mg/dL (ref 8.9–10.3)
Chloride: 93 mmol/L — ABNORMAL LOW (ref 98–111)
Creatinine, Ser: 10.03 mg/dL — ABNORMAL HIGH (ref 0.61–1.24)
GFR, Estimated: 5 mL/min — ABNORMAL LOW (ref >60)
Glucose, Bld: 272 mg/dL — ABNORMAL HIGH (ref 70–99)
Potassium: 4 mmol/L (ref 3.5–5.1)
Sodium: 131 mmol/L — ABNORMAL LOW (ref 135–145)
Total Bilirubin: 0.6 mg/dL (ref 0.0–1.2)
Total Protein: 7 g/dL (ref 6.5–8.1)

## 2023-03-08 LAB — PHOSPHORUS: Phosphorus: 8.6 mg/dL — ABNORMAL HIGH (ref 2.5–4.6)

## 2023-03-08 LAB — MAGNESIUM: Magnesium: 2.2 mg/dL (ref 1.7–2.4)

## 2023-03-08 LAB — HEPATITIS B SURFACE ANTIBODY, QUANTITATIVE: Hep B S AB Quant (Post): <3.5 m[IU]/mL — ABNORMAL LOW

## 2023-03-08 NOTE — Progress Notes (Signed)
 Patient ID: Anthony Bullock, male   DOB: 1960-05-06, 63 y.o.   MRN: [id number] [facility] Progress Note   Assessment/ Plan:   1.  Acute hypoxic respiratory failure: Likely secondary to volume overload from inadequate dialysis/truncated dialysis treatments. Improved with HD- weaned off BIPAP but remains on oxygen via [place] that could not be weaned off overnight. I will order for additional HD today primarily for UF. 2.  End-stage renal disease: Continue dialysis on TTS schedule with additional treatment ordered for today for volume unloading. 3.  Hypertension: Likely exacerbated by volume status/respiratory distress, monitor with ultrafiltration/hemodialysis. 4.  Anemia: Hemoglobin and hematocrit currently at goal, continue to monitor during hospitalization. 5.  Hyperkalemia: Secondary to cumulative effect of inadequate dialysis, corrected with HD yesterday.  Subjective:   Reports to be feeling better and happy to be off BIPAP- still on supplemental oxygen.    Objective:   BP (!) 165/56   Pulse 86   Temp 98.6 F (37 C) (Axillary)   Resp 18   Ht 5\' 10"  (1.778 m)   Wt 84.5 kg   SpO2 99%   BMI 26.73 kg/m   Physical Exam: WUJ:[id number] resting in bed, on oxygen via [place] AOZ:HYQMV regular rhythm and normal rate. Normal S1 and S2 Resp:Fine rales over bases, no wheeze/rhonchi. RIJ TDC HQI:ONGE, flat, non-tender Ext:s/p bilateral BKA, thrombosed (old) LUA AVG  Labs: BMET Recent Labs  Lab 03/06/23 2136 03/08/23 0519  NA 132* 131*  K 5.4* 4.0  CL 96* 93*  CO2 16* 20*  GLUCOSE 178* 272*  BUN 103* 87*  CREATININE 12.24* 10.03*  CALCIUM 9.1 8.9  PHOS  --  8.6*   CBC Recent Labs  Lab 03/06/23 2136 03/08/23 0519  WBC 11.5* 12.8*  NEUTROABS 8.6*  --   HGB 11.8* 11.5*  HCT 36.3* 35.4*  MCV 90.1 89.8  PLT 205 202      Medications:     Chlorhexidine Gluconate Cloth  6 each Topical Q0600   heparin  5,000 Units Subcutaneous Q8H   midodrine  10 mg Oral TID WC    Zetta Bills, MD 03/08/2023, 9:04 AM

## 2023-03-08 NOTE — Consult Note (Addendum)
 Cardiology Consultation   Patient ID: KALLIN HENK MRN: [id number]; DOB: 07/07/1960  Admit date: 03/06/2023 Date of Consult: 03/08/2023  PCP:  Anthony Covey, MD   [facility] Providers Cardiologist:  Anthony Rich, MD  Electrophysiologist:  Anthony Manges, MD       Patient Profile:   Anthony Bullock is a 63 y.o. male with a hx of chronic HFrEF/NICM s/p Bos Sci S-ICD 06/2013 subsequently left in place after battery ran out, mostly nonobstructive CAD by cath 2015 (diffuse diabetic vasculopathy managed medically), ESRD on HD, HTN then issues with hypotension, HLD, DM2, mild carotid disease (1-39% BICA 12/2022), LBBB, moderate MR by echo 08/2022, osteomyelitis, bilateral BKA, prior gangrenous wound, anemia, arthritis, kidney stones who is being seen 03/08/2023 for the evaluation of CP at the request of Dr. Flossie Bullock.  History of Present Illness:   Anthony Bullock had remote cath 2015 for cardiomyopathy with diffuse diabetic disease - LM (mild ostial tapering otherwise OK), LAD narrow vessel (30% ostial. 40-50% tubular lesion in the mid section. 80% lesion distally. Large diagonal with moderate diffuse disease and 70% lesion in midsection), LCx (non dominant vessel with diffuse diabetic vasculopathy. Large OM-1. Small OM-2 and OM-3. 2 PLs. 30% mid AV groove CX lesion. 40% lesion in proximal OM-1), and mild plaque in RCA. Last ischemic eval was via nuc in 08/2022 showing  large in size, severe, fixed defect involving the basal inferolateral, mid inferolateral, mid anterolateral and apical lateral segments c/w infarction involving the LCx, EF 18%. Last echo 11/2022 EF 40%, moderate LVH, G1DD, global HK. He had a S-ICD placed remotely that had reached ERI in 2022; at that time his EF had recovered somewhat, and the decision was made to let the battery run out and leave it in place. He has had multiple recurrent admissions for issues with respiratory failure, volume overload, PNA, previous  Covid-19, complicated at times by AF RVR requiring IV amio and mildly elevated troponin.  He presented back to the [facility] ER with hours of chest pain (described as "lung burning" and SOB starting around 7pm on 2/24. This felt like prior episodes of volume overload. He has noticed this trend typically happens  in the longer gap in dialysis between Saturday and Tuesday, ie always seems to come about on a Monday evening. He reports adherence to HD though states sometimes they have to stop a little early due to low BP. In the ED, the patient was afebrile and tachycardic with HR 90s-1teens (sinus by EKG). Initial O2 sat was reported OK per patient but then noed to be hypoxic in the 80s on RA and placed on BiPAP. CXR showed mild cardiac enlargement, no airspace disease or consolidation, no pleural effusion. Covid/flu/RSV neg. hsTroponin 44->53. BNP 576. WBC 11-12's. He received HD yesterday and feels markedly better today, feels well enough to go home.  Past Medical History:  Diagnosis Date   AICD (automatic cardioverter/defibrillator) present    [employer]   Allergic rhinitis    Anemia    Arthritis    Chronic systolic heart failure (HCC)    a. ECHO (12/2012) EF 25-30%, HK entireanteroseptal myocardium //  b.  EF 25%, diffuse HK, grade 1 diastolic dysfunction, MAC, mild LAE, normal RVSF, trivial pericardial effusion   COPD (chronic obstructive pulmonary disease) (HCC)    Diabetes mellitus type II    Diabetic nephropathy (HCC)    Diabetic neuropathy (HCC)    ESRD on hemodialysis (HCC)    started HD June 2017,  goes to [facility] HD unit, Dr Fausto Skillern   History of cardiac catheterization    a.Myoview 1/15:  There is significant left ventricular dysfunction. There may be slight scar at the apex. There is no significant ischemia. LV Ejection Fraction: 27%  //  b. RHC/LHC (1/15) with mean RA 6, PA 47/22 mean 33, mean PCWP 20, PVR 2.5 WU, CI 2.5; 80% dLAD stenosis, 70% diffuse large D.     History of  kidney stones    Hyperlipidemia    Hypertension    Kidney stones    NICM (nonischemic cardiomyopathy) (HCC)    Primarily nonischemic.  Echo (12/14) with EF 25-30%.  Echo (3/15) with EF 25%, mild to moderately dilated LV, normal RV size and systolic function.     Osteomyelitis (HCC)    left fifth ray   Pneumonia    Urethral stricture    Wears glasses     Past Surgical History:  Procedure Laterality Date   ABDOMINAL AORTOGRAM W/LOWER EXTREMITY N/A 03/30/2016   Procedure: Abdominal Aortogram w/Lower Extremity;  Surgeon: Chuck Hint, MD;  Location: [facility] INVASIVE CV LAB;  Service: Cardiovascular;  Laterality: N/A;   AMPUTATION Right 04/26/2016   Procedure: Right Below Knee Amputation;  Surgeon: Nadara Mustard, MD;  Location: [facility] OR;  Service: Orthopedics;  Laterality: Right;   AMPUTATION Left 08/21/2019   Procedure: LEFT FOOT 5TH RAY AMPUTATION;  Surgeon: Nadara Mustard, MD;  Location: [facility] OR;  Service: Orthopedics;  Laterality: Left;   AMPUTATION Left 11/13/2019   Procedure: LEFT BELOW KNEE AMPUTATION;  Surgeon: Nadara Mustard, MD;  Location: [facility] OR;  Service: Orthopedics;  Laterality: Left;   AV FISTULA PLACEMENT Right 09/08/2015   Procedure: INSERTION OF 4-78mm x 45cm  ARTERIOVENOUS (AV) GORE-TEX GRAFT RIGHT UPPER  ARM;  Surgeon: Chuck Hint, MD;  Location: [facility] OR;  Service: Vascular;  Laterality: Right;   AV FISTULA PLACEMENT Left 01/14/2016   Procedure: CREATION OF LEFT UPPER ARM ARTERIOVENOUS FISTULA;  Surgeon: Chuck Hint, MD;  Location: [facility] OR;  Service: Vascular;  Laterality: Left;   BASCILIC VEIN TRANSPOSITION Right 08/22/2014   Procedure: RIGHT UPPER ARM BASCILIC VEIN TRANSPOSITION;  Surgeon: Chuck Hint, MD;  Location: [facility] OR;  Service: Vascular;  Laterality: Right;   BELOW KNEE LEG AMPUTATION Right 04/26/2016   CARDIAC CATHETERIZATION     CARDIAC DEFIBRILLATOR PLACEMENT  06/27/2013   Sub Q       BY DR Graciela Husbands   CATARACT EXTRACTION W/PHACO Right 08/06/2018    Procedure: CATARACT EXTRACTION PHACO AND INTRAOCULAR LENS PLACEMENT (IOC);  Surgeon: Fabio Pierce, MD;  Location: [facility] ORS;  Service: Ophthalmology;  Laterality: Right;  CDE: 4.06   CATARACT EXTRACTION W/PHACO Left 08/20/2018   Procedure: CATARACT EXTRACTION PHACO AND INTRAOCULAR LENS PLACEMENT (IOC);  Surgeon: Fabio Pierce, MD;  Location: [facility] ORS;  Service: Ophthalmology;  Laterality: Left;  CDE: 6.76   COLONOSCOPY WITH PROPOFOL N/A 07/22/2015   Procedure: COLONOSCOPY WITH PROPOFOL;  Surgeon: Sherrilyn Rist, MD;  Location: [facility] ENDOSCOPY;  Service: Gastroenterology;  Laterality: N/A;   FEMORAL-POPLITEAL BYPASS GRAFT Right 03/31/2016   Procedure: BYPASS GRAFT FEMORAL-POPLITEAL ARTERY USING RIGHT GREATER SAPHENOUS NONREVERSED VEIN;  Surgeon: Chuck Hint, MD;  Location: [facility] OR;  Service: Vascular;  Laterality: Right;   HERNIA REPAIR     I & D EXTREMITY Right 03/31/2016   Procedure: IRRIGATION AND DEBRIDEMENT FOOT;  Surgeon: Chuck Hint, MD;  Location: [facility] OR;  Service: Vascular;  Laterality: Right;  IMPLANTABLE CARDIOVERTER DEFIBRILLATOR IMPLANT N/A 06/27/2013   Procedure: SUB Q ICD;  Surgeon: Duke Salvia, MD;  Location: [facility] CATH LAB;  Service: Cardiovascular;  Laterality: N/A;   INTRAOPERATIVE ARTERIOGRAM Right 03/31/2016   Procedure: INTRA OPERATIVE ARTERIOGRAM;  Surgeon: Chuck Hint, MD;  Location: [facility] OR;  Service: Vascular;  Laterality: Right;   IR GENERIC HISTORICAL Right 11/30/2015   IR THROMBECTOMY AV FISTULA W/THROMBOLYSIS/PTA INC/SHUNT/IMG RIGHT 11/30/2015 Irish Lack, MD [facility]   IR GENERIC HISTORICAL  11/30/2015   IR US GUIDE VASC ACCESS RIGHT 11/30/2015 Irish Lack, MD [facility]   IR GENERIC HISTORICAL Right 12/15/2015   IR THROMBECTOMY AV FISTULA W/THROMBOLYSIS/PTA/STENT INC/SHUNT/IMG RT 12/15/2015 Oley Balm, MD [facility]   IR GENERIC HISTORICAL  12/15/2015   IR US GUIDE VASC ACCESS RIGHT 12/15/2015 Oley Balm, MD [facility]    IR GENERIC HISTORICAL  12/28/2015   IR FLUORO GUIDE CV LINE RIGHT 12/28/2015 Jolaine Click, MD [facility]   IR GENERIC HISTORICAL  12/28/2015   IR US GUIDE VASC ACCESS RIGHT 12/28/2015 Jolaine Click, MD [facility]   LEFT A ND RIGHT HEART CATH  01/30/2013   DR Jones Broom   LEFT AND RIGHT HEART CATHETERIZATION WITH CORONARY ANGIOGRAM N/A 01/30/2013   Procedure: LEFT AND RIGHT HEART CATHETERIZATION WITH CORONARY ANGIOGRAM;  Surgeon: Dolores Patty, MD;  Location: [facility] CATH LAB;  Service: Cardiovascular;  Laterality: N/A;   PERIPHERAL VASCULAR CATHETERIZATION Right 01/26/2015   Procedure: A/V Fistulagram;  Surgeon: Chuck Hint, MD;  Location: [facility] INVASIVE CV LAB;  Service: Cardiovascular;  Laterality: Right;   reapea urethral surgery for recurrent obstruction  2011   TOTAL KNEE ARTHROPLASTY Right 2007   VEIN HARVEST Right 03/31/2016   Procedure: RIGHT GREATER SAPHENOUS VEIN HARVEST;  Surgeon: Chuck Hint, MD;  Location: [facility] OR;  Service: Vascular;  Laterality: Right;     Home Medications:  Prior to Admission medications   Medication Sig Start Date End Date Taking? Authorizing Provider  acetaminophen (TYLENOL) 325 MG tablet Take 1-2 tablets (325-650 mg total) by mouth every 4 (four) hours as needed for mild pain. 11/29/19  Yes Love, Evlyn Kanner, PA-C  apixaban (ELIQUIS) 5 MG TABS tablet Take 1 tablet (5 mg total) by mouth 2 (two) times daily. 12/21/22  Yes Burchette, Elberta Fortis, MD  atorvastatin (LIPITOR) 80 MG tablet TAKE 1 TABLET BY MOUTH AT BEDTIME Patient taking differently: Take 80 mg by mouth daily. 06/24/22  Yes Burchette, Elberta Fortis, MD  carvedilol (COREG) 12.5 MG tablet Take 1 tablet (12.5 mg total) by mouth 2 (two) times daily. 01/25/23  Yes Burchette, Elberta Fortis, MD  cinacalcet (SENSIPAR) 30 MG tablet Take 1 tablet (30 mg total) by mouth Every Tuesday,Thursday,and Saturday with dialysis. 11/30/19  Yes Love, Evlyn Kanner, PA-C  fenofibrate 160 MG tablet TAKE 1 TABLET BY MOUTH ONCE  DAILY Patient taking differently: Take 160 mg by mouth daily. 06/24/22  Yes Burchette, Elberta Fortis, MD  gabapentin (NEURONTIN) 300 MG capsule TAKE 2 CAPSULES BY MOUTH TWICE DAILY . Patient taking differently: Take 600 mg by mouth 2 (two) times daily. 06/24/22  Yes Burchette, Elberta Fortis, MD  HUMALOG KWIKPEN 200 UNIT/ML KwikPen INJECT A MAXIMUM OF 28 UNITS SUBCUTANEOUSLY TWICE DAILY WITH LUNCH AND SUPPER PER SLIDING SCALE. APPOINTMENT REQUIRED FOR FUTURE REFILLS 11/11/22  Yes Burchette, Elberta Fortis, MD  LANTUS SOLOSTAR 100 UNIT/ML Solostar Pen INJECT 12 UNITS SUBCUTANEOUSLY AT BEDTIME Patient taking differently: Inject 14 Units into the skin at bedtime. 07/15/22  Yes Burchette, Bruce  W, MD  midodrine (PROAMATINE) 10 MG tablet Take 10 mg by mouth 3 (three) times a week. EVERY TUESDAY, THURSDAY, AND SATURDAY 08/04/20  Yes [provider]  multivitamin (RENA-VIT) TABS tablet Take 1 tablet by mouth once daily Patient taking differently: Take 1 tablet by mouth daily. 07/06/21  Yes Burchette, Elberta Fortis, MD  Olopatadine HCl 0.2 % SOLN Place 1 drop into both eyes daily as needed (for allergies).    Yes [provider]  pantoprazole (PROTONIX) 20 MG tablet Take 20 mg by mouth daily. 02/28/23  Yes [provider]  sevelamer (RENAGEL) 800 MG tablet Take 1,600 mg by mouth 3 (three) times daily with meals.   Yes [provider]  VENTOLIN HFA 108 (90 Base) MCG/ACT inhaler Inhale 1-2 puffs into the lungs every 6 (six) hours as needed for wheezing or shortness of breath. 07/25/22  Yes Burchette, Elberta Fortis, MD  Continuous Glucose Receiver (FREESTYLE LIBRE 3 READER) DEVI Use to check blood glucose TID 08/11/22   Burchette, Elberta Fortis, MD  Continuous Glucose Sensor (FREESTYLE LIBRE 3 PLUS SENSOR) MISC Change sensor every 15 days. Use as directed to check glucose daily 12/21/22   Burchette, Elberta Fortis, MD  glucose blood test strip Check 1 time daily. E11.9 One Touch Ultra Blue Test Strips 03/10/14   Burchette, Elberta Fortis,  MD  Insulin Pen Needle (BD PEN NEEDLE NANO U/F) 32G X 4 MM MISC USE 1 PEN NEEDLE SUBCUTANEOUSLY WITH INSULIN 4 TIMES DAILY 12/04/19   Burchette, Elberta Fortis, MD    Inpatient Medications: Scheduled Meds:  Chlorhexidine Gluconate Cloth  6 each Topical Q0600   heparin  5,000 Units Subcutaneous Q8H   midodrine  10 mg Oral TID WC    PRN Meds: acetaminophen **OR** acetaminophen, ondansetron **OR** ondansetron (ZOFRAN) IV  Allergies:    Allergies  Allergen Reactions   Epoetin Alfa Other (See Comments)    Unknown    Ferumoxytol Other (See Comments)    Unknown    Morphine Sulfate Rash and Other (See Comments)    Itches all over, red spots    Social History:   Social History   Tobacco Use   Smoking status: Former    Current packs/day: 0.00    Average packs/day: 2.0 packs/day for 32.0 years (64.0 ttl pk-yrs)    Types: Cigarettes    Start date: 05/11/1978    Quit date: 05/11/2010    Years since quitting: 12.8   Smokeless tobacco: Never  Substance Use Topics   Alcohol use: No     Family History:    Family History  Problem Relation Age of Onset   Bladder Cancer Mother    Alcohol abuse Father    Melanoma Father    Stroke Maternal Grandmother    Heart Problems Maternal Grandmother        unknown   Diabetes Maternal Grandmother    Heart disease Maternal Grandfather    Prostate cancer Maternal Grandfather      ROS:  Please see the history of present illness.  All other ROS reviewed and negative.     Physical Exam/Data:   Vitals:   03/08/23 0409 03/08/23 0500 03/08/23 0600 03/08/23 0758  BP:  (!) 127/40 (!) 123/44   Pulse:  69 69   Resp:      Temp: 97.8 F (36.6 C)   98.6 F (37 C)  TempSrc: Axillary   Axillary  SpO2:  99% 98%   Weight:      Height:  Intake/Output Summary (Last 24 hours) at 03/08/2023 0823 Last data filed at 03/07/2023 2100 Gross per 24 hour  Intake 240 ml  Output 3001 ml  Net -2761 ml      03/07/2023   12:49 PM 03/07/2023    9:25 AM  03/06/2023    8:42 PM  Last 3 Weights  Weight (lbs) 186 lb 4.6 oz 193 lb 2 oz 205 lb 0.4 oz  Weight (kg) 84.5 kg 87.6 kg 93 kg     Body mass index is 26.73 kg/m.  General: Well developed, well nourished, in no acute distress. Head: Normocephalic, atraumatic, sclera non-icteric, no xanthomas, nares are without discharge. Neck: Negative for carotid bruits. JVP not elevated. Lungs: Clear bilaterally to auscultation without wheezes, rales, or rhonchi. Breathing is unlabored. Heart: RRR S1 S2 without murmurs, rubs, or gallops.  Abdomen: Soft, non-tender, non-distended with normoactive bowel sounds. No rebound/guarding. Extremities: s/p Bilateral BKA Neuro: Alert and oriented X 3. Moves all extremities spontaneously. Psych:  Responds to questions appropriately with a normal affect.   EKG:  The EKG was personally reviewed and demonstrates:  NSR with occasional PAC 97bpm, LBBB, nonspecific STTW changes Telemetry:  Telemetry was personally reviewed and demonstrates:  NSR rare PVC  Relevant CV Studies: Echo limited 11/2022   1. Left ventricular ejection fraction, by estimation, is 40%. The left  ventricle has mildly decreased function. The left ventricle demonstrates  global hypokinesis. There is moderate left ventricular hypertrophy. Left  ventricular diastolic parameters are   consistent with Grade I diastolic dysfunction (impaired relaxation).   2. Right ventricular systolic function is normal. The right ventricular  size is normal.   3. Limited echo to evaluate LV function   Comparison(s): EF 30-35%. Moderate concentric LVH. Moderate mitral  regurgitation.    Laboratory Data:  High Sensitivity Troponin:   Recent Labs  Lab 03/06/23 2136 03/06/23 2317  TROPONINIHS 44* 53*     Chemistry Recent Labs  Lab 03/06/23 2136 03/08/23 0519  NA 132* 131*  K 5.4* 4.0  CL 96* 93*  CO2 16* 20*  GLUCOSE 178* 272*  BUN 103* 87*  CREATININE 12.24* 10.03*  CALCIUM 9.1 8.9  MG  --   2.2  GFRNONAA 4* 5*  ANIONGAP 20* 18*    Recent Labs  Lab 03/06/23 2136 03/08/23 0519  PROT 7.5 7.0  ALBUMIN 3.2* 3.1*  AST 27 47*  ALT 28 29  ALKPHOS 95 61  BILITOT 0.7 0.6    Hematology Recent Labs  Lab 03/06/23 2136 03/08/23 0519  WBC 11.5* 12.8*  RBC 4.03* 3.94*  HGB 11.8* 11.5*  HCT 36.3* 35.4*  MCV 90.1 89.8  MCH 29.3 29.2  MCHC 32.5 32.5  RDW 14.8 14.7  PLT 205 202    BNP Recent Labs  Lab 03/06/23 2136  BNP 576.0*    DDimer  Recent Labs  Lab 03/07/23 0810  DDIMER 0.51*     Radiology/Studies:  DG Chest 2 View Result Date: 03/06/2023 CLINICAL DATA:  Chest pain, stomach bloating, sore spot on the right side. EXAM: CHEST - 2 VIEW COMPARISON:  09/24/2022 FINDINGS: Right central venous catheter with tip over the cavoatrial junction region. Cardiac defibrillator. Mild cardiac enlargement. Linear scarring in the lung bases. No airspace disease or consolidation in the lungs. No pleural effusion or pneumothorax. Mediastinal contours appear intact. Calcification of the aorta. IMPRESSION: Appliances are unchanged in position.  Lungs are clear today. Electronically Signed   By: Burman Nieves M.D.   On: 03/06/2023 23:16  Assessment and Plan:   1. Acute hypoxic respiratory failure in setting of volume overload, ESRD on HD, chronic HFrEF/NICM 2. Elevated troponin, low/flat, suspected demand ischemia, diffuse mostly nonobstructive CAD by cath 2015 3. HTN c/b hypotension at HD 4. PAF, maintaining NSR 5. Moderate MR by echo 08/2022  Patient seen/examined by myself and Dr. Diona Browner. Patient reports that historically these events tend to occur in the longer lapse in HD between Saturday and Tuesday suggesting a component of fluid accumulation. Unfortunately his historical hypotension limits aggressive GDMT for his HF. Suspect volume management with HD is the best option for symptom management. He is fortunately maintaining NSR at this time. Would continue Eliquis,  ordered with admission orders. Per d/w Dr. Diona Browner, hold off repeat echo at this time. No significant murmur on exam to suggest progression of MR, but can follow in outpatient setting. hsTroponins are low and flat despite several hours of pain, arguing against acute ischemia, suspected demand ischemia. See MD attestation for further thoughts.  Risk Assessment/Risk Scores:      New York Heart Association (NYHA) Functional Class NYHA Class IV on arrival  CHA2DS2-VASc Score = 4   This indicates a 4.8% annual risk of stroke. The patient's score is based upon: CHF History: 1 HTN History: 1 Diabetes History: 1 Stroke History: 0 Vascular Disease History: 1 Age Score: 0 Gender Score: 0     For questions or updates, please contact [facility] Please consult [email or web address] for contact info under    Signed, Laurann Montana, PA-C  03/08/2023 8:23 AM   Attending note:  Patient seen and examined.  I reviewed his records and discussed the case with Ms. Jetta Lout, I agree with her above findings.  Mr. Ashmead presents with shortness of breath and acute hypoxic respiratory failure in the setting of fluid overload.  He has ESRD on hemodialysis and has had similar recurrences.  He dialyzes on Tuesdays, Thursdays, and Saturdays, often has trouble by Monday when he has had an effective dialysis, session is sometimes shortened due to hypotension.  Has not been at dry weight.  He reports compliance with his baseline cardiac medications.  Fortunately, also not manifesting atrial fibrillation with this presentation.  His high-sensitivity troponin I trend is not consistent with ACS and reflects demand ischemia.  He tolerated hemodialysis yesterday with additional session planned today.  He does not report any recent chest pain or palpitations.  He is afebrile, heart rate in the 70s to 80s in sinus rhythm by telemetry which I personally reviewed.  Systolic blood pressure ranging 110-160.  Lungs are clear  anteriorly, cardiac exam with RRR and soft systolic murmur, no gallop.  S/p bilateral BKA.  Pertinent lab work includes potassium 4.0, creatinine 10.03, BNP 576, high-sensitivity troponin levels in the 40s to 50s, WBC 12.8, hemoglobin 11.5, platelets 202.  Viral respiratory panel negative.  Chest x-ray from February 24 showed no pleural effusions or infiltrates.  I reviewed his ECG which shows sinus rhythm with old bundle branch block and PAC.  Patient presents with hypoxic respiratory failure in the setting of volume overload as discussed above.  He has known HFrEF with nonischemic cardiomyopathy, LVEF approximately 40% by echocardiogram in November 2024.  Looks like a major contributing factor is adequate hemodialysis for fluid removal.  No recent breakthrough atrial fibrillation to complicate picture, he is in sinus rhythm at this time.  Patient responded well to hemodialysis session yesterday, nephrology plans to repeat today.  From a cardiac perspective would recommend  continuing baseline outpatient regimen, we will arrange outpatient follow-up.  Hold off on repeat echocardiogram at this time.  Typically on Eliquis 5 mg twice daily, Lipitor 80 mg daily, Coreg 12.5 mg twice daily, and ProAmatine 10 mg with hemodialysis sessions.  We will sign off.  Jonelle Sidle, M.D., F.A.C.C.

## 2023-03-08 NOTE — Discharge Summary (Signed)
 Physician Discharge Summary   Patient: Anthony Bullock MRN: [id number] DOB: 1960/07/01  Admit date:     03/06/2023  Discharge date: 03/08/23  Discharge Physician: Kendell Bane   PCP: Kristian Covey, MD   Recommendations at discharge:   Follow-up with nephrologist and PCP next week With a cardiologist next 4-6 weeks Continue with dialysis as scheduled, planned for tomorrow HD  Discharge Diagnoses: Principal Problem:   Chest pain Active Problems:   Atrial fibrillation (HCC)   Volume overload   ESRD on dialysis (HCC)   Chronic systolic congestive heart failure (HCC)   Essential hypertension   S/P bilateral below knee amputation (HCC)   Peripheral vascular disease (HCC)   Acute respiratory failure with hypoxia (HCC)  Resolved Problems:   * No resolved hospital problems. *  Hospital Course: Anthony Bullock is a 63 year old male with a history of hypertension, hyperlipidemia, diabetes mellitus type 2, bilateral BKA, ESRD (TTS),HFrEF (EF 30-35%), anxiety/depression presenting with cough and shortness of breath that started fairly acutely around 7 PM on 03/06/2023.  The patient endorses compliance with dialysis.  He has not been getting his dialysis session short.  Notably, the patient has had recent hospital admissions for fluid overload requiring dialysis on 09/24/2022 as well as from 09/06/2022 to 09/09/22. Notably, the patient had a recent hospital admission from 08/18/2022 to 08/26/2022 for acute respiratory failure secondary to fluid overload.   He did have some chest discomfort that also developed around 7 PM on 03/06/2023. In the ED, the patient was afebrile and tachycardic in 110-120.  He was hypoxic saturation 87-88% room air.  He was placed on BiPAP.  WBC 12.5, hemoglobin 11.8, platelets 205.  Sodium 132, potassium 5.4, bicarbonate 16, serum creatinine 12.24.  Chest x-ray showed some mild increased interstitial markings.  Nephrology was consulted to assist with  dialysis.    Acute respiratory failure with hypoxia -Much improved -COVID-19 PCR negative -With the patient's acute onset, and nearly unremarkable chest x-ray--, unclear if fluid overload is entirely etiology -D-dimer--normal for age, low suspicion for PE as pt already on [facility] -Stable on BiPAP -Weaned  oxygen for sats greater 92% -PCT 0.42   Fluid overload -Nephrology consulted - S/P HD    ESRD -Patient dialyzes Tuesday, Thursday, Saturday -Status post hemodialysis yesterday 03/07/2023 -Nephrology consulted to assist   Elevated troponin, -Remain low/flat, suspected demand ischemia, diffuse mostly nonobstructive CAD by cath 201  -S/p cardiology consultation-no further workup or change in medication cleared for discharge    chronic HFrEF -Continue carvedilol -His soft BP has limited GDMT -08/22/2022 echo EF 30-35%, grade 2 DD, normal RV function, moderate MR -historical hypotension limits aggressive GDMT for his HF 01/28/2022 echo EF 40%,  grade 1 DD, normal RVF   Paroxysmal atrial fibrillation -Currently in sinus rhythm -Continue apixaban -Continue carvedilol   Coronary artery disease -Denies any chest pain this morning -Last tress test on 08/25/2022 which showed a prior infarct consistent with prior left circumflex infarct but no ischemia noted.  -Continue apixaban and carvedilol -/SP cardiology evaluation, cleared for discharge   Uncontrolled diabetes mellitus type 2 with hyperglycemia -06/24/2022 hemoglobin A1c 8.7 -12/21/2022 hemoglobin A1c at 7.3 -NovoLog sliding scale -He is on Lantus 14 units at bedtime at home   COPD -Continue bronchodilator therapy -02/01/2023 PFTs--minimal obstructive disease, severe diffusion defect, moderate restriction suggesting parenchymal process -follow up Dr. Wynona Neat   PAD status post bilateral BKA -Patient with prosthesis for ambulation. -Patient initially was on aspirin and heparin and subsequently aspirin  discontinued and patient  transition to Eliquis.  Patient maintained on home regimen of fenofibrate and atorvastatin.   Hypertension -Continue carvedilol   Mixed hyperlipidemia -Continue statin         Consultants: Cardiologist/nephrologist Procedures performed: Dialysis Disposition: Home Diet recommendation:  Discharge Diet Orders (From admission, onward)     Start     Ordered   03/08/23 0000  Diet - low sodium heart healthy        03/08/23 1009   03/08/23 0000  Diet Carb Modified        03/08/23 1009           Cardiac diet DISCHARGE MEDICATION: Allergies as of 03/08/2023       Reactions   Epoetin Alfa Other (See Comments)   Unknown    Ferumoxytol Other (See Comments)   Unknown    Morphine Sulfate Rash, Other (See Comments)   Itches all over, red spots        Medication List     TAKE these medications    acetaminophen 325 MG tablet Commonly known as: TYLENOL Take 1-2 tablets (325-650 mg total) by mouth every 4 (four) hours as needed for mild pain.   apixaban 5 MG Tabs tablet Commonly known as: Eliquis Take 1 tablet (5 mg total) by mouth 2 (two) times daily.   atorvastatin 80 MG tablet Commonly known as: LIPITOR TAKE 1 TABLET BY MOUTH AT BEDTIME What changed:  how much to take how to take this when to take this additional instructions   BD Pen Needle Nano U/F 32G X 4 MM Misc Generic drug: Insulin Pen Needle USE 1 PEN NEEDLE SUBCUTANEOUSLY WITH INSULIN 4 TIMES DAILY   carvedilol 12.5 MG tablet Commonly known as: COREG Take 1 tablet (12.5 mg total) by mouth 2 (two) times daily.   cinacalcet 30 MG tablet Commonly known as: SENSIPAR Take 1 tablet (30 mg total) by mouth Every Tuesday,Thursday,and Saturday with dialysis.   fenofibrate 160 MG tablet TAKE 1 TABLET BY MOUTH ONCE DAILY What changed:  how much to take how to take this when to take this additional instructions   FreeStyle Libre 3 Plus Sensor Misc Change sensor every 15 days. Use as directed to check  glucose daily   FreeStyle Libre 3 Reader Devi Use to check blood glucose TID   gabapentin 300 MG capsule Commonly known as: NEURONTIN TAKE 2 CAPSULES BY MOUTH TWICE DAILY . What changed:  how much to take how to take this when to take this additional instructions   glucose blood test strip Check 1 time daily. E11.9 One Touch Ultra Blue Test Strips   HumaLOG KwikPen 200 UNIT/ML KwikPen Generic drug: insulin lispro INJECT A MAXIMUM OF 28 UNITS SUBCUTANEOUSLY TWICE DAILY WITH LUNCH AND SUPPER PER SLIDING SCALE. APPOINTMENT REQUIRED FOR FUTURE REFILLS   Lantus SoloStar 100 UNIT/ML Solostar Pen Generic drug: insulin glargine INJECT 12 UNITS SUBCUTANEOUSLY AT BEDTIME What changed: See the new instructions.   midodrine 10 MG tablet Commonly known as: PROAMATINE Take 10 mg by mouth 3 (three) times a week. EVERY TUESDAY, THURSDAY, AND SATURDAY   multivitamin Tabs tablet Take 1 tablet by mouth once daily   Olopatadine HCl 0.2 % Soln Place 1 drop into both eyes daily as needed (for allergies).   pantoprazole 20 MG tablet Commonly known as: PROTONIX Take 20 mg by mouth daily.   sevelamer 800 MG tablet Commonly known as: RENAGEL Take 1,600 mg by mouth 3 (three) times daily with meals.   Ventolin  HFA 108 (90 Base) MCG/ACT inhaler Generic drug: albuterol Inhale 1-2 puffs into the lungs every 6 (six) hours as needed for wheezing or shortness of breath.        Follow-up Information     Sharlene Dory, NP Follow up.   Specialty: Cardiology Why: [facility] - [place] location - cardiology follow-up Friday Apr 14, 2023 4:00 PM. Arrive 15 minutes prior to appointment to check in. Contact information: [address] [place] [address] [phone]                Discharge Exam: Ceasar Mons Weights   03/06/23 2042 03/07/23 0925 03/07/23 1249  Weight: 93 kg 87.6 kg 84.5 kg        General:  AAO x 3,  cooperative, no distress;   HEENT:  Normocephalic, PERRL,  otherwise with in Normal limits   Neuro:  CNII-XII intact. , normal motor and sensation, reflexes intact   Lungs:   Clear to auscultation BL, Respirations unlabored,  No wheezes / crackles  Cardio:    S1/S2, RRR, No murmure, No Rubs or Gallops   Abdomen:  Soft, non-tender, bowel sounds active all four quadrants, no guarding or peritoneal signs.  Muscular  skeletal:  Bilateral BKA  Limited exam -global generalized weaknesses - in bed, able to move all 4 extremities,   2+ pulses,  symmetric, No pitting edema  Skin:  Dry, warm to touch, negative for any Rashes,  Wounds: Please see nursing documentation  Pressure Injury 11/16/19 Buttocks Left Stage 2 -  Partial thickness loss of dermis presenting as a shallow open injury with a red, pink wound bed without slough. dry shiny nonblanchable area what appears to be a healing stage 2 (Active)  11/16/19 1435  Location: Buttocks  Location Orientation: Left  Staging: Stage 2 -  Partial thickness loss of dermis presenting as a shallow open injury with a red, pink wound bed without slough.  Wound Description (Comments): dry shiny nonblanchable area what appears to be a healing stage 2  Present on Admission: Yes     Pressure Injury 11/16/19 Buttocks Right Stage 2 -  Partial thickness loss of dermis presenting as a shallow open injury with a red, pink wound bed without slough. nonblanchable area what appears to be a healing stage 2 with dry flacky skin covering (Active)  11/16/19 1435  Location: Buttocks  Location Orientation: Right  Staging: Stage 2 -  Partial thickness loss of dermis presenting as a shallow open injury with a red, pink wound bed without slough.  Wound Description (Comments): nonblanchable area what appears to be a healing stage 2 with dry flacky skin covering  Present on Admission: Yes          Condition at discharge: good  The results of significant diagnostics from this hospitalization (including imaging, microbiology,  ancillary and laboratory) are listed below for reference.   Imaging Studies: DG Chest 2 View Result Date: 03/06/2023 CLINICAL DATA:  Chest pain, stomach bloating, sore spot on the right side. EXAM: CHEST - 2 VIEW COMPARISON:  09/24/2022 FINDINGS: Right central venous catheter with tip over the cavoatrial junction region. Cardiac defibrillator. Mild cardiac enlargement. Linear scarring in the lung bases. No airspace disease or consolidation in the lungs. No pleural effusion or pneumothorax. Mediastinal contours appear intact. Calcification of the aorta. IMPRESSION: Appliances are unchanged in position.  Lungs are clear today. Electronically Signed   By: Burman Nieves M.D.   On: 03/06/2023 23:16    Microbiology: Results  for orders placed or performed during the hospital encounter of 03/06/23  Resp panel by RT-PCR (RSV, Flu A&B, Covid) Anterior Nasal Swab     Status: None   Collection Time: 03/06/23 11:20 PM   Specimen: Anterior Nasal Swab  Result Value Ref Range Status   SARS Coronavirus 2 by RT PCR NEGATIVE NEGATIVE Final    Comment: (NOTE) SARS-CoV-2 target nucleic acids are NOT DETECTED.  The SARS-CoV-2 RNA is generally detectable in upper respiratory specimens during the acute phase of infection. The lowest concentration of SARS-CoV-2 viral copies this assay can detect is 138 copies/mL. A negative result does not preclude SARS-Cov-2 infection and should not be used as the sole basis for treatment or other patient management decisions. A negative result may occur with  improper specimen collection/handling, submission of specimen other than nasopharyngeal swab, presence of viral mutation(s) within the areas targeted by this assay, and inadequate number of viral copies(<138 copies/mL). A negative result must be combined with clinical observations, patient history, and epidemiological information. The expected result is Negative.  Fact Sheet for Patients:   BloggerCourse.com  Fact Sheet for Healthcare Providers:  SeriousBroker.it  This test is no t yet approved or cleared by the [place] FDA and  has been authorized for detection and/or diagnosis of SARS-CoV-2 by FDA under an Emergency Use Authorization (EUA). This EUA will remain  in effect (meaning this test can be used) for the duration of the COVID-19 declaration under Section 564(b)(1) of the Act, 21 U.S.C.section 360bbb-3(b)(1), unless the authorization is terminated  or revoked sooner.       Influenza A by PCR NEGATIVE NEGATIVE Final   Influenza B by PCR NEGATIVE NEGATIVE Final    Comment: (NOTE) The Xpert Xpress SARS-CoV-2/FLU/RSV plus assay is intended as an aid in the diagnosis of influenza from Nasopharyngeal swab specimens and should not be used as a sole basis for treatment. Nasal washings and aspirates are unacceptable for Xpert Xpress SARS-CoV-2/FLU/RSV testing.  Fact Sheet for Patients: BloggerCourse.com  Fact Sheet for Healthcare Providers: SeriousBroker.it  This test is not yet approved or cleared by the [place] FDA and has been authorized for detection and/or diagnosis of SARS-CoV-2 by FDA under an Emergency Use Authorization (EUA). This EUA will remain in effect (meaning this test can be used) for the duration of the COVID-19 declaration under Section 564(b)(1) of the Act, 21 U.S.C. section 360bbb-3(b)(1), unless the authorization is terminated or revoked.     Resp Syncytial Virus by PCR NEGATIVE NEGATIVE Final    Comment: (NOTE) Fact Sheet for Patients: BloggerCourse.com  Fact Sheet for Healthcare Providers: SeriousBroker.it  This test is not yet approved or cleared by the [place] FDA and has been authorized for detection and/or diagnosis of SARS-CoV-2 by FDA under an Emergency Use  Authorization (EUA). This EUA will remain in effect (meaning this test can be used) for the duration of the COVID-19 declaration under Section 564(b)(1) of the Act, 21 U.S.C. section 360bbb-3(b)(1), unless the authorization is terminated or revoked.  Performed at [facility], [address], [place], [place] [address]   MRSA Next Gen by PCR, Nasal     Status: None   Collection Time: 03/07/23  3:59 AM   Specimen: Nasal Mucosa; Nasal Swab  Result Value Ref Range Status   MRSA by PCR Next Gen NOT DETECTED NOT DETECTED Final    Comment: (NOTE) The GeneXpert MRSA Assay (FDA approved for NASAL specimens only), is one component of a comprehensive MRSA colonization surveillance program.  It is not intended to diagnose MRSA infection nor to guide or monitor treatment for MRSA infections. Test performance is not FDA approved in patients less than 80 years old. Performed at [facility], [address], [place], [place] [address]   Respiratory (~20 pathogens) panel by PCR     Status: None   Collection Time: 03/07/23 11:15 AM   Specimen: Nasopharyngeal Swab; Respiratory  Result Value Ref Range Status   Adenovirus NOT DETECTED NOT DETECTED Final   Coronavirus 229E NOT DETECTED NOT DETECTED Final    Comment: (NOTE) The Coronavirus on the Respiratory Panel, DOES NOT test for the novel  Coronavirus (2019 nCoV)    Coronavirus HKU1 NOT DETECTED NOT DETECTED Final   Coronavirus NL63 NOT DETECTED NOT DETECTED Final   Coronavirus OC43 NOT DETECTED NOT DETECTED Final   Metapneumovirus NOT DETECTED NOT DETECTED Final   Rhinovirus / Enterovirus NOT DETECTED NOT DETECTED Final   Influenza A NOT DETECTED NOT DETECTED Final   Influenza B NOT DETECTED NOT DETECTED Final   Parainfluenza Virus 1 NOT DETECTED NOT DETECTED Final   Parainfluenza Virus 2 NOT DETECTED NOT DETECTED Final   Parainfluenza Virus 3 NOT DETECTED NOT DETECTED Final   Parainfluenza Virus 4 NOT DETECTED NOT DETECTED Final   Respiratory  Syncytial Virus NOT DETECTED NOT DETECTED Final   Bordetella pertussis NOT DETECTED NOT DETECTED Final   Bordetella Parapertussis NOT DETECTED NOT DETECTED Final   Chlamydophila pneumoniae NOT DETECTED NOT DETECTED Final   Mycoplasma pneumoniae NOT DETECTED NOT DETECTED Final    Comment: Performed at [facility], [address], [place], [place] [address]   *Note: Due to a large number of results and/or encounters for the requested time period, some results have not been displayed. A complete set of results can be found in Results Review.    Labs: CBC: Recent Labs  Lab 03/06/23 2136 03/08/23 0519  WBC 11.5* 12.8*  NEUTROABS 8.6*  --   HGB 11.8* 11.5*  HCT 36.3* 35.4*  MCV 90.1 89.8  PLT 205 202   Basic Metabolic Panel: Recent Labs  Lab 03/06/23 2136 03/08/23 0519  NA 132* 131*  K 5.4* 4.0  CL 96* 93*  CO2 16* 20*  GLUCOSE 178* 272*  BUN 103* 87*  CREATININE 12.24* 10.03*  CALCIUM 9.1 8.9  MG  --  2.2  PHOS  --  8.6*   Liver Function Tests: Recent Labs  Lab 03/06/23 2136 03/08/23 0519  AST 27 47*  ALT 28 29  ALKPHOS 95 61  BILITOT 0.7 0.6  PROT 7.5 7.0  ALBUMIN 3.2* 3.1*   CBG: No results for input(s): "GLUCAP" in the last 168 hours.  Discharge time spent: greater than 40 minutes.  Signed: Kendell Bane, MD [facility] 03/08/2023

## 2023-03-08 NOTE — TOC Transition Note (Signed)
 Transition of Care [facility]) - Discharge Note   Patient Details  Name: Anthony Bullock MRN: [id number] Date of Birth: 06/29/1960  Transition of [facility]) CM/SW Contact:  Leitha Bleak, RN Phone Number: 03/08/2023, 10:44 AM   Clinical Narrative:     Patient already up for discharge, ready to go, TOC confirmed he was not changing to OBS status. Patient gets HD TTS. Home with spouse, no needs    Final next level of care: Home/Self Care Barriers to Discharge: Barriers Resolved   Patient Goals and CMS Choice Patient states their goals for this hospitalization and ongoing recovery are:: return home. CMS Medicare.gov Compare Post Acute Care list provided to:: Patient Choice offered to / list presented to : Patient      Discharge Placement             Patient and family notified of of transfer: 03/08/23  Discharge Plan and Services Additional resources added to the After Visit Summary for             Social Drivers of Health (SDOH) Interventions SDOH Screenings   Food Insecurity: No Food Insecurity (03/07/2023)  Housing: Low Risk  (03/07/2023)  Transportation Needs: No Transportation Needs (03/07/2023)  Utilities: Not At Risk (03/07/2023)  Alcohol Screen: Low Risk  (02/03/2023)  Depression (PHQ2-9): Low Risk  (02/03/2023)  Financial Resource Strain: Low Risk  (02/03/2023)  Physical Activity: Inactive (02/03/2023)  Social Connections: Socially Integrated (03/07/2023)  Stress: No Stress Concern Present (02/03/2023)  Tobacco Use: Medium Risk (03/06/2023)  Health Literacy: Adequate Health Literacy (02/03/2023)     Readmission Risk Interventions    03/08/2023   10:44 AM  Readmission Risk Prevention Plan  Transportation Screening Complete  PCP or Specialist Appt within 3-5 Days Complete  HRI or Home Care Consult Complete  Social Work Consult for Recovery Care Planning/Counseling Complete  Palliative Care Screening Not Applicable  Medication Review Oceanographer) Complete

## 2023-03-08 NOTE — Plan of Care (Signed)
 Discharged

## 2023-03-08 NOTE — Care Management Important Message (Signed)
 Important Message  Patient Details  Name: Anthony Bullock MRN: [id number] Date of Birth: [birth year]   Important Message Given:  N/A - LOS <3 / Initial given by admissions     Corey Harold 03/08/2023, 11:06 AM

## 2023-03-09 DIAGNOSIS — D509 Iron deficiency anemia, unspecified: Secondary | ICD-10-CM | POA: Diagnosis not present

## 2023-03-09 DIAGNOSIS — D631 Anemia in chronic kidney disease: Secondary | ICD-10-CM | POA: Diagnosis not present

## 2023-03-09 DIAGNOSIS — N2581 Secondary hyperparathyroidism of renal origin: Secondary | ICD-10-CM | POA: Diagnosis not present

## 2023-03-09 DIAGNOSIS — N250 Renal osteodystrophy: Secondary | ICD-10-CM | POA: Diagnosis not present

## 2023-03-09 DIAGNOSIS — N186 End stage renal disease: Secondary | ICD-10-CM | POA: Diagnosis not present

## 2023-03-09 DIAGNOSIS — Z992 Dependence on renal dialysis: Secondary | ICD-10-CM | POA: Diagnosis not present

## 2023-03-10 DIAGNOSIS — N186 End stage renal disease: Secondary | ICD-10-CM | POA: Diagnosis not present

## 2023-03-10 DIAGNOSIS — Z992 Dependence on renal dialysis: Secondary | ICD-10-CM | POA: Diagnosis not present

## 2023-03-10 NOTE — Consult Note (Signed)
 [facility] Liaison Note  03/10/2023  Anthony Bullock March 20, 1960 [id number]  Location: RN Hospital Liaison screened the patient remotely at [facility].  Insurance: Medicare   Anthony Bullock is a 63 y.o. male who is a Primary Care Patient of [place]chette, Elberta Fortis, MD [facility]. The patient was screened for  readmission hospitalization with noted high risk score for unplanned readmission risk with 2 IP in 6 months.  The patient was assessed for potential Care Management service needs for post hospital transition for care coordination. Review of patient's electronic medical record reveals patient was admitted for Chest Pain. Pt discharged home with self-care. Liaison attempted outreach call today to introduce VBCI care management services however unsuccessful. No anticipated needs as pt continue HD on TTS, discharged home with self care and has a supportive spouse as indicated in documentation via TOC.  Plan: [facility] Liaison will continue to follow progress and disposition to asess for post hospital community care coordination/management needs.  Referral request for community care coordination: anticipate Transitions of Care Team follow up.   VBCI Care Management/Population Health does not replace or interfere with any arrangements made by the Inpatient Transition of Care team.   For questions contact:   Elliot Cousin, RN, BSN Hospital Liaison [place]   [facility], Population Health Office Hours MTWF  8:00 am-6:00 pm Direct Dial: [phone] mobile [email or web address]

## 2023-03-11 DIAGNOSIS — D631 Anemia in chronic kidney disease: Secondary | ICD-10-CM | POA: Diagnosis not present

## 2023-03-11 DIAGNOSIS — D509 Iron deficiency anemia, unspecified: Secondary | ICD-10-CM | POA: Diagnosis not present

## 2023-03-11 DIAGNOSIS — N2581 Secondary hyperparathyroidism of renal origin: Secondary | ICD-10-CM | POA: Diagnosis not present

## 2023-03-11 DIAGNOSIS — N250 Renal osteodystrophy: Secondary | ICD-10-CM | POA: Diagnosis not present

## 2023-03-11 DIAGNOSIS — N186 End stage renal disease: Secondary | ICD-10-CM | POA: Diagnosis not present

## 2023-03-11 DIAGNOSIS — Z992 Dependence on renal dialysis: Secondary | ICD-10-CM | POA: Diagnosis not present

## 2023-03-14 DIAGNOSIS — N186 End stage renal disease: Secondary | ICD-10-CM | POA: Diagnosis not present

## 2023-03-14 DIAGNOSIS — N2581 Secondary hyperparathyroidism of renal origin: Secondary | ICD-10-CM | POA: Diagnosis not present

## 2023-03-14 DIAGNOSIS — Z992 Dependence on renal dialysis: Secondary | ICD-10-CM | POA: Diagnosis not present

## 2023-03-14 DIAGNOSIS — D509 Iron deficiency anemia, unspecified: Secondary | ICD-10-CM | POA: Diagnosis not present

## 2023-03-14 DIAGNOSIS — D631 Anemia in chronic kidney disease: Secondary | ICD-10-CM | POA: Diagnosis not present

## 2023-03-15 ENCOUNTER — Inpatient Hospital Stay: Payer: Medicare Other | Admitting: Family Medicine

## 2023-03-16 DIAGNOSIS — Z992 Dependence on renal dialysis: Secondary | ICD-10-CM | POA: Diagnosis not present

## 2023-03-16 DIAGNOSIS — N2581 Secondary hyperparathyroidism of renal origin: Secondary | ICD-10-CM | POA: Diagnosis not present

## 2023-03-16 DIAGNOSIS — N186 End stage renal disease: Secondary | ICD-10-CM | POA: Diagnosis not present

## 2023-03-16 DIAGNOSIS — D631 Anemia in chronic kidney disease: Secondary | ICD-10-CM | POA: Diagnosis not present

## 2023-03-16 DIAGNOSIS — D509 Iron deficiency anemia, unspecified: Secondary | ICD-10-CM | POA: Diagnosis not present

## 2023-03-17 DIAGNOSIS — N2581 Secondary hyperparathyroidism of renal origin: Secondary | ICD-10-CM | POA: Diagnosis not present

## 2023-03-17 DIAGNOSIS — Z992 Dependence on renal dialysis: Secondary | ICD-10-CM | POA: Diagnosis not present

## 2023-03-17 DIAGNOSIS — D509 Iron deficiency anemia, unspecified: Secondary | ICD-10-CM | POA: Diagnosis not present

## 2023-03-17 DIAGNOSIS — D631 Anemia in chronic kidney disease: Secondary | ICD-10-CM | POA: Diagnosis not present

## 2023-03-17 DIAGNOSIS — N186 End stage renal disease: Secondary | ICD-10-CM | POA: Diagnosis not present

## 2023-03-18 DIAGNOSIS — D631 Anemia in chronic kidney disease: Secondary | ICD-10-CM | POA: Diagnosis not present

## 2023-03-18 DIAGNOSIS — Z992 Dependence on renal dialysis: Secondary | ICD-10-CM | POA: Diagnosis not present

## 2023-03-18 DIAGNOSIS — D509 Iron deficiency anemia, unspecified: Secondary | ICD-10-CM | POA: Diagnosis not present

## 2023-03-18 DIAGNOSIS — N2581 Secondary hyperparathyroidism of renal origin: Secondary | ICD-10-CM | POA: Diagnosis not present

## 2023-03-18 DIAGNOSIS — N186 End stage renal disease: Secondary | ICD-10-CM | POA: Diagnosis not present

## 2023-03-20 ENCOUNTER — Ambulatory Visit (INDEPENDENT_AMBULATORY_CARE_PROVIDER_SITE_OTHER): Admitting: Family Medicine

## 2023-03-20 VITALS — BP 106/60 | HR 69 | Temp 97.6°F | Wt 206.8 lb

## 2023-03-20 DIAGNOSIS — N186 End stage renal disease: Secondary | ICD-10-CM | POA: Diagnosis not present

## 2023-03-20 DIAGNOSIS — E1169 Type 2 diabetes mellitus with other specified complication: Secondary | ICD-10-CM

## 2023-03-20 DIAGNOSIS — Z992 Dependence on renal dialysis: Secondary | ICD-10-CM | POA: Diagnosis not present

## 2023-03-20 DIAGNOSIS — Z794 Long term (current) use of insulin: Secondary | ICD-10-CM | POA: Diagnosis not present

## 2023-03-20 DIAGNOSIS — R0600 Dyspnea, unspecified: Secondary | ICD-10-CM | POA: Diagnosis not present

## 2023-03-20 DIAGNOSIS — E785 Hyperlipidemia, unspecified: Secondary | ICD-10-CM

## 2023-03-20 LAB — POCT GLYCOSYLATED HEMOGLOBIN (HGB A1C): Hemoglobin A1C: 6.6 % — AB (ref 4.0–5.6)

## 2023-03-20 MED ORDER — VENTOLIN HFA 108 (90 BASE) MCG/ACT IN AERS: 1.0000 | INHALATION_SPRAY | Freq: Four times a day (QID) | RESPIRATORY_TRACT | 1 refills | Status: DC | PRN

## 2023-03-20 NOTE — Progress Notes (Signed)
 Established Patient Office Visit  Subjective   Patient ID: MCCRAE SPECIALE, male    DOB: Nov 04, 1960  Age: 63 y.o. MRN: [id number]  No chief complaint on file.   HPI   Anthony Bullock has history of multiple chronic problems including systolic heart failure, atrial fibrillation, CAD, hypertension, type 2 diabetes, end-stage kidney disease on dialysis.  He is seen for hospital follow-up.  Admitted to [facility] February 24 through 26 th.    He apparently developed some cough and increased shortness of breath relatively acutely around 7 PM on the 24th.  He has been very compliant with dialysis and also following less than 2000 mg/day sodium diet and keeping fluids at 32 ounces or less per day.  He has had several other previous admissions for presumed volume overload including August and September. Patient did relate some mild chest discomfort on admission.  Was afebrile but tachycardic with heart rate of about 120.  Hypoxic with 87 to 88% room air O2 sats.  Was eventually placed on BiPAP which seemed to help.  White count 12.5 thousand with hemoglobin 11.8.  Chest x-ray showed mild increased interstitial markings but no significant pulmonary edema.  COVID testing negative.  D-dimer normal for age.  Patient also chronically on Eliquis and low suspicion for acute pulmonary embolus.  He had mildly elevated troponin which remained low/flat.  Suspected demand ischemia.  Consult by cardiology with no further evaluation recommended.  He feels like he is back to baseline at this time.  He had pulmonary function test back in January with reduced FVC but normal FEV1/FVC ratio.  No significant response to bronchodilators.  There is evidence reduced diffusing capacity indicating severe loss of functional alveolar capillary surface.  He has follow-up with pulmonary in April.  Diabetes has been much better controlled at home.  He remains on Lantus 14 units daily and using Humalog with sliding scale.  He feels  like continuous glucose monitor has helped significantly with his control.  He has tightened up his diet up considerably.  No recent hypoglycemia.  Past Medical History:  Diagnosis Date   AICD (automatic cardioverter/defibrillator) present    [employer]   Allergic rhinitis    Anemia    Arthritis    Chronic systolic heart failure (HCC)    a. ECHO (12/2012) EF 25-30%, HK entireanteroseptal myocardium //  b.  EF 25%, diffuse HK, grade 1 diastolic dysfunction, MAC, mild LAE, normal RVSF, trivial pericardial effusion   COPD (chronic obstructive pulmonary disease) (HCC)    Diabetes mellitus type II    Diabetic nephropathy (HCC)    Diabetic neuropathy (HCC)    ESRD on hemodialysis (HCC)    started HD June 2017, goes to [facility] HD unit, Dr Fausto Skillern   History of cardiac catheterization    a.Myoview 1/15:  There is significant left ventricular dysfunction. There may be slight scar at the apex. There is no significant ischemia. LV Ejection Fraction: 27%  //  b. RHC/LHC (1/15) with mean RA 6, PA 47/22 mean 33, mean PCWP 20, PVR 2.5 WU, CI 2.5; 80% dLAD stenosis, 70% diffuse large D.     History of kidney stones    Hyperlipidemia    Hypertension    Kidney stones    NICM (nonischemic cardiomyopathy) (HCC)    Primarily nonischemic.  Echo (12/14) with EF 25-30%.  Echo (3/15) with EF 25%, mild to moderately dilated LV, normal RV size and systolic function.     Osteomyelitis (HCC)  left fifth ray   Pneumonia    Urethral stricture    Wears glasses    Past Surgical History:  Procedure Laterality Date   ABDOMINAL AORTOGRAM W/LOWER EXTREMITY N/A 03/30/2016   Procedure: Abdominal Aortogram w/Lower Extremity;  Surgeon: Chuck Hint, MD;  Location: [facility] INVASIVE CV LAB;  Service: Cardiovascular;  Laterality: N/A;   AMPUTATION Right 04/26/2016   Procedure: Right Below Knee Amputation;  Surgeon: Nadara Mustard, MD;  Location: [facility] OR;  Service: Orthopedics;  Laterality: Right;   AMPUTATION  Left 08/21/2019   Procedure: LEFT FOOT 5TH RAY AMPUTATION;  Surgeon: Nadara Mustard, MD;  Location: [facility] OR;  Service: Orthopedics;  Laterality: Left;   AMPUTATION Left 11/13/2019   Procedure: LEFT BELOW KNEE AMPUTATION;  Surgeon: Nadara Mustard, MD;  Location: [facility] OR;  Service: Orthopedics;  Laterality: Left;   AV FISTULA PLACEMENT Right 09/08/2015   Procedure: INSERTION OF 4-40mm x 45cm  ARTERIOVENOUS (AV) GORE-TEX GRAFT RIGHT UPPER  ARM;  Surgeon: Chuck Hint, MD;  Location: [facility] OR;  Service: Vascular;  Laterality: Right;   AV FISTULA PLACEMENT Left 01/14/2016   Procedure: CREATION OF LEFT UPPER ARM ARTERIOVENOUS FISTULA;  Surgeon: Chuck Hint, MD;  Location: [facility] OR;  Service: Vascular;  Laterality: Left;   BASCILIC VEIN TRANSPOSITION Right 08/22/2014   Procedure: RIGHT UPPER ARM BASCILIC VEIN TRANSPOSITION;  Surgeon: Chuck Hint, MD;  Location: [facility] OR;  Service: Vascular;  Laterality: Right;   BELOW KNEE LEG AMPUTATION Right 04/26/2016   CARDIAC CATHETERIZATION     CARDIAC DEFIBRILLATOR PLACEMENT  06/27/2013   Sub Q       BY DR Graciela Husbands   CATARACT EXTRACTION W/PHACO Right 08/06/2018   Procedure: CATARACT EXTRACTION PHACO AND INTRAOCULAR LENS PLACEMENT (IOC);  Surgeon: Fabio Pierce, MD;  Location: [facility] ORS;  Service: Ophthalmology;  Laterality: Right;  CDE: 4.06   CATARACT EXTRACTION W/PHACO Left 08/20/2018   Procedure: CATARACT EXTRACTION PHACO AND INTRAOCULAR LENS PLACEMENT (IOC);  Surgeon: Fabio Pierce, MD;  Location: [facility] ORS;  Service: Ophthalmology;  Laterality: Left;  CDE: 6.76   COLONOSCOPY WITH PROPOFOL N/A 07/22/2015   Procedure: COLONOSCOPY WITH PROPOFOL;  Surgeon: Sherrilyn Rist, MD;  Location: [facility] ENDOSCOPY;  Service: Gastroenterology;  Laterality: N/A;   FEMORAL-POPLITEAL BYPASS GRAFT Right 03/31/2016   Procedure: BYPASS GRAFT FEMORAL-POPLITEAL ARTERY USING RIGHT GREATER SAPHENOUS NONREVERSED VEIN;  Surgeon: Chuck Hint, MD;  Location: [facility] OR;  Service:  Vascular;  Laterality: Right;   HERNIA REPAIR     I & D EXTREMITY Right 03/31/2016   Procedure: IRRIGATION AND DEBRIDEMENT FOOT;  Surgeon: Chuck Hint, MD;  Location: [facility] OR;  Service: Vascular;  Laterality: Right;   IMPLANTABLE CARDIOVERTER DEFIBRILLATOR IMPLANT N/A 06/27/2013   Procedure: SUB Q ICD;  Surgeon: Duke Salvia, MD;  Location: [facility] CATH LAB;  Service: Cardiovascular;  Laterality: N/A;   INTRAOPERATIVE ARTERIOGRAM Right 03/31/2016   Procedure: INTRA OPERATIVE ARTERIOGRAM;  Surgeon: Chuck Hint, MD;  Location: [facility] OR;  Service: Vascular;  Laterality: Right;   IR GENERIC HISTORICAL Right 11/30/2015   IR THROMBECTOMY AV FISTULA W/THROMBOLYSIS/PTA INC/SHUNT/IMG RIGHT 11/30/2015 Irish Lack, MD [facility]   IR GENERIC HISTORICAL  11/30/2015   IR US GUIDE VASC ACCESS RIGHT 11/30/2015 Irish Lack, MD [facility]   IR GENERIC HISTORICAL Right 12/15/2015   IR THROMBECTOMY AV FISTULA W/THROMBOLYSIS/PTA/STENT INC/SHUNT/IMG RT 12/15/2015 Oley Balm, MD [facility]   IR GENERIC HISTORICAL  12/15/2015   IR US GUIDE VASC ACCESS RIGHT 12/15/2015  Oley Balm, MD [facility]   IR GENERIC HISTORICAL  12/28/2015   IR FLUORO GUIDE CV LINE RIGHT 12/28/2015 Jolaine Click, MD [facility]   IR GENERIC HISTORICAL  12/28/2015   IR US GUIDE VASC ACCESS RIGHT 12/28/2015 Jolaine Click, MD [facility]   LEFT A ND RIGHT HEART CATH  01/30/2013   DR Jones Broom   LEFT AND RIGHT HEART CATHETERIZATION WITH CORONARY ANGIOGRAM N/A 01/30/2013   Procedure: LEFT AND RIGHT HEART CATHETERIZATION WITH CORONARY ANGIOGRAM;  Surgeon: Dolores Patty, MD;  Location: [facility] CATH LAB;  Service: Cardiovascular;  Laterality: N/A;   PERIPHERAL VASCULAR CATHETERIZATION Right 01/26/2015   Procedure: A/V Fistulagram;  Surgeon: Chuck Hint, MD;  Location: [facility] INVASIVE CV LAB;  Service: Cardiovascular;  Laterality: Right;   reapea urethral surgery for recurrent obstruction  2011   TOTAL KNEE  ARTHROPLASTY Right 2007   VEIN HARVEST Right 03/31/2016   Procedure: RIGHT GREATER SAPHENOUS VEIN HARVEST;  Surgeon: Chuck Hint, MD;  Location: [facility] OR;  Service: Vascular;  Laterality: Right;    reports that he quit smoking about 12 years ago. His smoking use included cigarettes. He started smoking about 44 years ago. He has a 64 pack-year smoking history. He has never used smokeless tobacco. He reports that he does not drink alcohol and does not use drugs. family history includes Alcohol abuse in his father; Bladder Cancer in his mother; Diabetes in his maternal grandmother; Heart Problems in his maternal grandmother; Heart disease in his maternal grandfather; Melanoma in his father; Prostate cancer in his maternal grandfather; Stroke in his maternal grandmother. Allergies  Allergen Reactions   Epoetin Alfa Other (See Comments)    Unknown    Ferumoxytol Other (See Comments)    Unknown    Morphine Sulfate Rash and Other (See Comments)    Itches all over, red spots    Review of Systems  Constitutional:  Negative for chills and fever.  Respiratory:  Negative for cough and shortness of breath.   Cardiovascular:  Negative for chest pain.      Objective:     BP 106/60 (BP Location: Left Arm, Patient Position: Sitting, Cuff Size: Normal)   Pulse 69   Temp 97.6 F (36.4 C) (Oral)   Wt 206 lb 12.8 oz (93.8 kg)   SpO2 96%   BMI 29.67 kg/m  BP Readings from Last 3 Encounters:  03/20/23 106/60  03/08/23 (!) 149/58  01/25/23 (!) 122/58   Wt Readings from Last 3 Encounters:  03/20/23 206 lb 12.8 oz (93.8 kg)  03/07/23 186 lb 4.6 oz (84.5 kg)  02/03/23 206 lb (93.4 kg)      Physical Exam Vitals reviewed.  Constitutional:      General: He is not in acute distress.    Appearance: He is not ill-appearing.  Cardiovascular:     Rate and Rhythm: Normal rate and regular rhythm.  Pulmonary:     Effort: Pulmonary effort is normal.     Breath sounds: Normal breath sounds. No  wheezing or rales.  Neurological:     Mental Status: He is alert.      No results found for any visits on 03/20/23.  Last CBC Lab Results  Component Value Date   WBC 12.8 (H) 03/08/2023   HGB 11.5 (L) 03/08/2023   HCT 35.4 (L) 03/08/2023   MCV 89.8 03/08/2023   MCH 29.2 03/08/2023   RDW 14.7 03/08/2023   PLT 202 03/08/2023   Last metabolic panel Lab Results  Component  Value Date   GLUCOSE 272 (H) 03/08/2023   NA 131 (L) 03/08/2023   K 4.0 03/08/2023   CL 93 (L) 03/08/2023   CO2 20 (L) 03/08/2023   BUN 87 (H) 03/08/2023   CREATININE 10.03 (H) 03/08/2023   GFRNONAA 5 (L) 03/08/2023   CALCIUM 8.9 03/08/2023   PHOS 8.6 (H) 03/08/2023   PROT 7.0 03/08/2023   ALBUMIN 3.1 (L) 03/08/2023   BILITOT 0.6 03/08/2023   ALKPHOS 61 03/08/2023   AST 47 (H) 03/08/2023   ALT 29 03/08/2023   ANIONGAP 18 (H) 03/08/2023   Last lipids Lab Results  Component Value Date   CHOL 132 06/24/2022   HDL 38.50 (L) 06/24/2022   LDLCALC 57 06/24/2022   LDLDIRECT 134.0 04/25/2011   TRIG 181.0 (H) 06/24/2022   CHOLHDL 3 06/24/2022   Last hemoglobin A1c Lab Results  Component Value Date   HGBA1C 6.6 (A) 03/20/2023   Last thyroid functions Lab Results  Component Value Date   TSH 1.475 08/25/2022      The 10-year ASCVD risk score (Arnett DK, et al., 2019) is: 12.3%    Assessment & Plan:   #1 recent admission for dyspnea, atypical chest pain, hypoxia with presumed volume overload.  Patient did have abnormal pulmonary function testing back in January with concern for possible diffusion defect.  Interestingly, he had O2 sat the day room air at rest 96% and dropped to 67% with ambulation.  This promptly went back up to 95% with rest.  He has pending follow-up with pulmonary will discuss further with them  #2 end-stage renal disease on hemodialysis.  He has been very compliant recently with diet with sodium and fluid intake and getting regular dialysis every Tuesday, Thursday, and  Saturday.  #3 type 2 diabetes.  Longstanding history of poor control but improved at this time.  A1c today 6.6%.  He has made some positive dietary changes and current numbers reflect that.  Currently stable on Lantus and Humalog sliding scale with meals  Set up 32-month follow-up  Evelena Peat, MD

## 2023-03-20 NOTE — Patient Instructions (Addendum)
 A1C today was 6.6%.    Oxygen saturation did drop 67% today with walking.  Will need to discuss this with pulmonary.

## 2023-03-21 DIAGNOSIS — D631 Anemia in chronic kidney disease: Secondary | ICD-10-CM | POA: Diagnosis not present

## 2023-03-21 DIAGNOSIS — N186 End stage renal disease: Secondary | ICD-10-CM | POA: Diagnosis not present

## 2023-03-21 DIAGNOSIS — N2581 Secondary hyperparathyroidism of renal origin: Secondary | ICD-10-CM | POA: Diagnosis not present

## 2023-03-21 DIAGNOSIS — Z992 Dependence on renal dialysis: Secondary | ICD-10-CM | POA: Diagnosis not present

## 2023-03-21 DIAGNOSIS — D509 Iron deficiency anemia, unspecified: Secondary | ICD-10-CM | POA: Diagnosis not present

## 2023-03-23 DIAGNOSIS — N2581 Secondary hyperparathyroidism of renal origin: Secondary | ICD-10-CM | POA: Diagnosis not present

## 2023-03-23 DIAGNOSIS — D509 Iron deficiency anemia, unspecified: Secondary | ICD-10-CM | POA: Diagnosis not present

## 2023-03-23 DIAGNOSIS — Z992 Dependence on renal dialysis: Secondary | ICD-10-CM | POA: Diagnosis not present

## 2023-03-23 DIAGNOSIS — N186 End stage renal disease: Secondary | ICD-10-CM | POA: Diagnosis not present

## 2023-03-23 DIAGNOSIS — D631 Anemia in chronic kidney disease: Secondary | ICD-10-CM | POA: Diagnosis not present

## 2023-03-25 DIAGNOSIS — Z992 Dependence on renal dialysis: Secondary | ICD-10-CM | POA: Diagnosis not present

## 2023-03-25 DIAGNOSIS — N2581 Secondary hyperparathyroidism of renal origin: Secondary | ICD-10-CM | POA: Diagnosis not present

## 2023-03-25 DIAGNOSIS — D509 Iron deficiency anemia, unspecified: Secondary | ICD-10-CM | POA: Diagnosis not present

## 2023-03-25 DIAGNOSIS — N186 End stage renal disease: Secondary | ICD-10-CM | POA: Diagnosis not present

## 2023-03-25 DIAGNOSIS — D631 Anemia in chronic kidney disease: Secondary | ICD-10-CM | POA: Diagnosis not present

## 2023-03-28 DIAGNOSIS — Z992 Dependence on renal dialysis: Secondary | ICD-10-CM | POA: Diagnosis not present

## 2023-03-28 DIAGNOSIS — D509 Iron deficiency anemia, unspecified: Secondary | ICD-10-CM | POA: Diagnosis not present

## 2023-03-28 DIAGNOSIS — N186 End stage renal disease: Secondary | ICD-10-CM | POA: Diagnosis not present

## 2023-03-28 DIAGNOSIS — D631 Anemia in chronic kidney disease: Secondary | ICD-10-CM | POA: Diagnosis not present

## 2023-03-28 DIAGNOSIS — N2581 Secondary hyperparathyroidism of renal origin: Secondary | ICD-10-CM | POA: Diagnosis not present

## 2023-03-30 DIAGNOSIS — Z992 Dependence on renal dialysis: Secondary | ICD-10-CM | POA: Diagnosis not present

## 2023-03-30 DIAGNOSIS — D509 Iron deficiency anemia, unspecified: Secondary | ICD-10-CM | POA: Diagnosis not present

## 2023-03-30 DIAGNOSIS — N186 End stage renal disease: Secondary | ICD-10-CM | POA: Diagnosis not present

## 2023-03-30 DIAGNOSIS — N2581 Secondary hyperparathyroidism of renal origin: Secondary | ICD-10-CM | POA: Diagnosis not present

## 2023-03-30 DIAGNOSIS — D631 Anemia in chronic kidney disease: Secondary | ICD-10-CM | POA: Diagnosis not present

## 2023-04-01 DIAGNOSIS — D631 Anemia in chronic kidney disease: Secondary | ICD-10-CM | POA: Diagnosis not present

## 2023-04-01 DIAGNOSIS — N186 End stage renal disease: Secondary | ICD-10-CM | POA: Diagnosis not present

## 2023-04-01 DIAGNOSIS — Z992 Dependence on renal dialysis: Secondary | ICD-10-CM | POA: Diagnosis not present

## 2023-04-01 DIAGNOSIS — D509 Iron deficiency anemia, unspecified: Secondary | ICD-10-CM | POA: Diagnosis not present

## 2023-04-01 DIAGNOSIS — N2581 Secondary hyperparathyroidism of renal origin: Secondary | ICD-10-CM | POA: Diagnosis not present

## 2023-04-02 DIAGNOSIS — D631 Anemia in chronic kidney disease: Secondary | ICD-10-CM | POA: Diagnosis not present

## 2023-04-02 DIAGNOSIS — D509 Iron deficiency anemia, unspecified: Secondary | ICD-10-CM | POA: Diagnosis not present

## 2023-04-02 DIAGNOSIS — Z992 Dependence on renal dialysis: Secondary | ICD-10-CM | POA: Diagnosis not present

## 2023-04-02 DIAGNOSIS — N2581 Secondary hyperparathyroidism of renal origin: Secondary | ICD-10-CM | POA: Diagnosis not present

## 2023-04-02 DIAGNOSIS — N186 End stage renal disease: Secondary | ICD-10-CM | POA: Diagnosis not present

## 2023-04-04 DIAGNOSIS — N186 End stage renal disease: Secondary | ICD-10-CM | POA: Diagnosis not present

## 2023-04-04 DIAGNOSIS — Z992 Dependence on renal dialysis: Secondary | ICD-10-CM | POA: Diagnosis not present

## 2023-04-04 DIAGNOSIS — N2581 Secondary hyperparathyroidism of renal origin: Secondary | ICD-10-CM | POA: Diagnosis not present

## 2023-04-04 DIAGNOSIS — D631 Anemia in chronic kidney disease: Secondary | ICD-10-CM | POA: Diagnosis not present

## 2023-04-04 DIAGNOSIS — D509 Iron deficiency anemia, unspecified: Secondary | ICD-10-CM | POA: Diagnosis not present

## 2023-04-06 DIAGNOSIS — Z992 Dependence on renal dialysis: Secondary | ICD-10-CM | POA: Diagnosis not present

## 2023-04-06 DIAGNOSIS — N2581 Secondary hyperparathyroidism of renal origin: Secondary | ICD-10-CM | POA: Diagnosis not present

## 2023-04-06 DIAGNOSIS — Z794 Long term (current) use of insulin: Secondary | ICD-10-CM | POA: Diagnosis not present

## 2023-04-06 DIAGNOSIS — N186 End stage renal disease: Secondary | ICD-10-CM | POA: Diagnosis not present

## 2023-04-06 DIAGNOSIS — D509 Iron deficiency anemia, unspecified: Secondary | ICD-10-CM | POA: Diagnosis not present

## 2023-04-06 DIAGNOSIS — E119 Type 2 diabetes mellitus without complications: Secondary | ICD-10-CM | POA: Diagnosis not present

## 2023-04-06 DIAGNOSIS — D631 Anemia in chronic kidney disease: Secondary | ICD-10-CM | POA: Diagnosis not present

## 2023-04-08 DIAGNOSIS — Z992 Dependence on renal dialysis: Secondary | ICD-10-CM | POA: Diagnosis not present

## 2023-04-08 DIAGNOSIS — D631 Anemia in chronic kidney disease: Secondary | ICD-10-CM | POA: Diagnosis not present

## 2023-04-08 DIAGNOSIS — D509 Iron deficiency anemia, unspecified: Secondary | ICD-10-CM | POA: Diagnosis not present

## 2023-04-08 DIAGNOSIS — N186 End stage renal disease: Secondary | ICD-10-CM | POA: Diagnosis not present

## 2023-04-08 DIAGNOSIS — N2581 Secondary hyperparathyroidism of renal origin: Secondary | ICD-10-CM | POA: Diagnosis not present

## 2023-04-10 ENCOUNTER — Emergency Department (HOSPITAL_COMMUNITY)

## 2023-04-10 ENCOUNTER — Other Ambulatory Visit: Payer: Self-pay

## 2023-04-10 ENCOUNTER — Inpatient Hospital Stay (HOSPITAL_COMMUNITY)
Admission: EM | Admit: 2023-04-10 | Discharge: 2023-04-15 | DRG: 323 | Disposition: A | Discharge status: Home / Self Care | Attending: Internal Medicine | Admitting: Internal Medicine

## 2023-04-10 ENCOUNTER — Encounter (HOSPITAL_COMMUNITY): Payer: Self-pay | Admitting: Emergency Medicine

## 2023-04-10 DIAGNOSIS — Z833 Family history of diabetes mellitus: Secondary | ICD-10-CM

## 2023-04-10 DIAGNOSIS — Z794 Long term (current) use of insulin: Secondary | ICD-10-CM

## 2023-04-10 DIAGNOSIS — I493 Ventricular premature depolarization: Secondary | ICD-10-CM | POA: Diagnosis present

## 2023-04-10 DIAGNOSIS — E871 Hypo-osmolality and hyponatremia: Secondary | ICD-10-CM | POA: Diagnosis present

## 2023-04-10 DIAGNOSIS — E1122 Type 2 diabetes mellitus with diabetic chronic kidney disease: Secondary | ICD-10-CM | POA: Diagnosis not present

## 2023-04-10 DIAGNOSIS — E8889 Other specified metabolic disorders: Secondary | ICD-10-CM | POA: Diagnosis not present

## 2023-04-10 DIAGNOSIS — Z8249 Family history of ischemic heart disease and other diseases of the circulatory system: Secondary | ICD-10-CM

## 2023-04-10 DIAGNOSIS — Z8616 Personal history of COVID-19: Secondary | ICD-10-CM | POA: Diagnosis not present

## 2023-04-10 DIAGNOSIS — I25110 Atherosclerotic heart disease of native coronary artery with unstable angina pectoris: Secondary | ICD-10-CM | POA: Diagnosis not present

## 2023-04-10 DIAGNOSIS — K219 Gastro-esophageal reflux disease without esophagitis: Secondary | ICD-10-CM | POA: Insufficient documentation

## 2023-04-10 DIAGNOSIS — Z96651 Presence of right artificial knee joint: Secondary | ICD-10-CM | POA: Diagnosis present

## 2023-04-10 DIAGNOSIS — R0600 Dyspnea, unspecified: Principal | ICD-10-CM

## 2023-04-10 DIAGNOSIS — D509 Iron deficiency anemia, unspecified: Secondary | ICD-10-CM | POA: Diagnosis not present

## 2023-04-10 DIAGNOSIS — L89322 Pressure ulcer of left buttock, stage 2: Secondary | ICD-10-CM | POA: Diagnosis present

## 2023-04-10 DIAGNOSIS — Z87891 Personal history of nicotine dependence: Secondary | ICD-10-CM

## 2023-04-10 DIAGNOSIS — E1151 Type 2 diabetes mellitus with diabetic peripheral angiopathy without gangrene: Secondary | ICD-10-CM | POA: Diagnosis present

## 2023-04-10 DIAGNOSIS — R0602 Shortness of breath: Secondary | ICD-10-CM | POA: Diagnosis not present

## 2023-04-10 DIAGNOSIS — N2581 Secondary hyperparathyroidism of renal origin: Secondary | ICD-10-CM | POA: Diagnosis present

## 2023-04-10 DIAGNOSIS — I959 Hypotension, unspecified: Secondary | ICD-10-CM | POA: Diagnosis not present

## 2023-04-10 DIAGNOSIS — I440 Atrioventricular block, first degree: Secondary | ICD-10-CM | POA: Diagnosis present

## 2023-04-10 DIAGNOSIS — I480 Paroxysmal atrial fibrillation: Secondary | ICD-10-CM | POA: Diagnosis not present

## 2023-04-10 DIAGNOSIS — D72829 Elevated white blood cell count, unspecified: Secondary | ICD-10-CM | POA: Diagnosis present

## 2023-04-10 DIAGNOSIS — D631 Anemia in chronic kidney disease: Secondary | ICD-10-CM | POA: Diagnosis not present

## 2023-04-10 DIAGNOSIS — R079 Chest pain, unspecified: Secondary | ICD-10-CM

## 2023-04-10 DIAGNOSIS — Z7901 Long term (current) use of anticoagulants: Secondary | ICD-10-CM

## 2023-04-10 DIAGNOSIS — E785 Hyperlipidemia, unspecified: Secondary | ICD-10-CM | POA: Diagnosis present

## 2023-04-10 DIAGNOSIS — J449 Chronic obstructive pulmonary disease, unspecified: Secondary | ICD-10-CM | POA: Diagnosis present

## 2023-04-10 DIAGNOSIS — I2489 Other forms of acute ischemic heart disease: Secondary | ICD-10-CM | POA: Diagnosis present

## 2023-04-10 DIAGNOSIS — N186 End stage renal disease: Secondary | ICD-10-CM | POA: Diagnosis not present

## 2023-04-10 DIAGNOSIS — Z89512 Acquired absence of left leg below knee: Secondary | ICD-10-CM | POA: Diagnosis not present

## 2023-04-10 DIAGNOSIS — Z808 Family history of malignant neoplasm of other organs or systems: Secondary | ICD-10-CM

## 2023-04-10 DIAGNOSIS — Z811 Family history of alcohol abuse and dependence: Secondary | ICD-10-CM

## 2023-04-10 DIAGNOSIS — I447 Left bundle-branch block, unspecified: Secondary | ICD-10-CM | POA: Diagnosis present

## 2023-04-10 DIAGNOSIS — I214 Non-ST elevation (NSTEMI) myocardial infarction: Secondary | ICD-10-CM | POA: Diagnosis not present

## 2023-04-10 DIAGNOSIS — Z89511 Acquired absence of right leg below knee: Secondary | ICD-10-CM | POA: Diagnosis not present

## 2023-04-10 DIAGNOSIS — L89312 Pressure ulcer of right buttock, stage 2: Secondary | ICD-10-CM | POA: Diagnosis present

## 2023-04-10 DIAGNOSIS — E1142 Type 2 diabetes mellitus with diabetic polyneuropathy: Secondary | ICD-10-CM

## 2023-04-10 DIAGNOSIS — Z992 Dependence on renal dialysis: Secondary | ICD-10-CM

## 2023-04-10 DIAGNOSIS — R0789 Other chest pain: Secondary | ICD-10-CM | POA: Diagnosis not present

## 2023-04-10 DIAGNOSIS — R069 Unspecified abnormalities of breathing: Secondary | ICD-10-CM | POA: Diagnosis not present

## 2023-04-10 DIAGNOSIS — Z955 Presence of coronary angioplasty implant and graft: Secondary | ICD-10-CM | POA: Diagnosis not present

## 2023-04-10 DIAGNOSIS — Z823 Family history of stroke: Secondary | ICD-10-CM

## 2023-04-10 DIAGNOSIS — R000 Tachycardia, unspecified: Secondary | ICD-10-CM | POA: Diagnosis present

## 2023-04-10 DIAGNOSIS — I5042 Chronic combined systolic (congestive) and diastolic (congestive) heart failure: Secondary | ICD-10-CM | POA: Diagnosis present

## 2023-04-10 DIAGNOSIS — I132 Hypertensive heart and chronic kidney disease with heart failure and with stage 5 chronic kidney disease, or end stage renal disease: Secondary | ICD-10-CM | POA: Diagnosis not present

## 2023-04-10 DIAGNOSIS — I2510 Atherosclerotic heart disease of native coronary artery without angina pectoris: Secondary | ICD-10-CM | POA: Diagnosis present

## 2023-04-10 DIAGNOSIS — R0989 Other specified symptoms and signs involving the circulatory and respiratory systems: Secondary | ICD-10-CM | POA: Diagnosis present

## 2023-04-10 DIAGNOSIS — Z8042 Family history of malignant neoplasm of prostate: Secondary | ICD-10-CM

## 2023-04-10 DIAGNOSIS — N250 Renal osteodystrophy: Secondary | ICD-10-CM | POA: Diagnosis not present

## 2023-04-10 DIAGNOSIS — R0689 Other abnormalities of breathing: Secondary | ICD-10-CM | POA: Diagnosis not present

## 2023-04-10 DIAGNOSIS — Z79899 Other long term (current) drug therapy: Secondary | ICD-10-CM

## 2023-04-10 DIAGNOSIS — I428 Other cardiomyopathies: Secondary | ICD-10-CM | POA: Diagnosis present

## 2023-04-10 DIAGNOSIS — Z7902 Long term (current) use of antithrombotics/antiplatelets: Secondary | ICD-10-CM

## 2023-04-10 DIAGNOSIS — Z87442 Personal history of urinary calculi: Secondary | ICD-10-CM

## 2023-04-10 DIAGNOSIS — I2584 Coronary atherosclerosis due to calcified coronary lesion: Secondary | ICD-10-CM | POA: Diagnosis present

## 2023-04-10 DIAGNOSIS — Z885 Allergy status to narcotic agent status: Secondary | ICD-10-CM

## 2023-04-10 DIAGNOSIS — Z9841 Cataract extraction status, right eye: Secondary | ICD-10-CM

## 2023-04-10 DIAGNOSIS — Z7982 Long term (current) use of aspirin: Secondary | ICD-10-CM

## 2023-04-10 DIAGNOSIS — Z888 Allergy status to other drugs, medicaments and biological substances status: Secondary | ICD-10-CM

## 2023-04-10 DIAGNOSIS — Z95810 Presence of automatic (implantable) cardiac defibrillator: Secondary | ICD-10-CM

## 2023-04-10 DIAGNOSIS — Z9842 Cataract extraction status, left eye: Secondary | ICD-10-CM

## 2023-04-10 DIAGNOSIS — I5022 Chronic systolic (congestive) heart failure: Secondary | ICD-10-CM | POA: Diagnosis not present

## 2023-04-10 DIAGNOSIS — E8779 Other fluid overload: Secondary | ICD-10-CM | POA: Diagnosis not present

## 2023-04-10 DIAGNOSIS — Z961 Presence of intraocular lens: Secondary | ICD-10-CM | POA: Diagnosis present

## 2023-04-10 DIAGNOSIS — E878 Other disorders of electrolyte and fluid balance, not elsewhere classified: Secondary | ICD-10-CM | POA: Diagnosis present

## 2023-04-10 DIAGNOSIS — Z8052 Family history of malignant neoplasm of bladder: Secondary | ICD-10-CM

## 2023-04-10 LAB — COMPREHENSIVE METABOLIC PANEL WITH GFR
ALT: 25 U/L (ref 0–44)
AST: 37 U/L (ref 15–41)
Albumin: 3.5 g/dL (ref 3.5–5.0)
Alkaline Phosphatase: 114 U/L (ref 38–126)
Anion gap: 18 — ABNORMAL HIGH (ref 5–15)
BUN: 78 mg/dL — ABNORMAL HIGH (ref 8–23)
CO2: 20 mmol/L — ABNORMAL LOW (ref 22–32)
Calcium: 8.7 mg/dL — ABNORMAL LOW (ref 8.9–10.3)
Chloride: 92 mmol/L — ABNORMAL LOW (ref 98–111)
Creatinine, Ser: 10.44 mg/dL — ABNORMAL HIGH (ref 0.61–1.24)
GFR, Estimated: 5 mL/min — ABNORMAL LOW (ref >60)
Glucose, Bld: 138 mg/dL — ABNORMAL HIGH (ref 70–99)
Potassium: 4 mmol/L (ref 3.5–5.1)
Sodium: 130 mmol/L — ABNORMAL LOW (ref 135–145)
Total Bilirubin: 0.8 mg/dL (ref 0.0–1.2)
Total Protein: 7.5 g/dL (ref 6.5–8.1)

## 2023-04-10 LAB — CBC WITH DIFFERENTIAL/PLATELET
Abs Immature Granulocytes: 0.04 10*3/uL (ref 0.00–0.07)
Basophils Absolute: 0.1 10*3/uL (ref 0.0–0.1)
Basophils Relative: 0 %
Eosinophils Absolute: 0.3 10*3/uL (ref 0.0–0.5)
Eosinophils Relative: 3 %
HCT: 36.5 % — ABNORMAL LOW (ref 39.0–52.0)
Hemoglobin: 12.2 g/dL — ABNORMAL LOW (ref 13.0–17.0)
Immature Granulocytes: 0 %
Lymphocytes Relative: 11 %
Lymphs Abs: 1.3 10*3/uL (ref 0.7–4.0)
MCH: 29.6 pg (ref 26.0–34.0)
MCHC: 33.4 g/dL (ref 30.0–36.0)
MCV: 88.6 fL (ref 80.0–100.0)
Monocytes Absolute: 1.2 10*3/uL — ABNORMAL HIGH (ref 0.1–1.0)
Monocytes Relative: 10 %
Neutro Abs: 8.6 10*3/uL — ABNORMAL HIGH (ref 1.7–7.7)
Neutrophils Relative %: 76 %
Platelets: 218 10*3/uL (ref 150–400)
RBC: 4.12 MIL/uL — ABNORMAL LOW (ref 4.22–5.81)
RDW: 14.1 % (ref 11.5–15.5)
WBC: 11.5 10*3/uL — ABNORMAL HIGH (ref 4.0–10.5)
nRBC: 0 % (ref 0.0–0.2)

## 2023-04-10 LAB — TROPONIN I (HIGH SENSITIVITY): Troponin I (High Sensitivity): 176 ng/L (ref <18)

## 2023-04-10 NOTE — ED Triage Notes (Signed)
 Pt c/o sob has history of copd. Ems placed pt on 5lpm. Pt is to have dialysis tomorrow.

## 2023-04-10 NOTE — ED Notes (Signed)
 Pt states he needs cpap until dialysis. Oxygen saturation 100% on room air at this time.

## 2023-04-10 NOTE — ED Provider Notes (Signed)
 [place] EMERGENCY DEPARTMENT AT [facility] Provider Note  CSN: [id number] Arrival date & time: 04/10/23 2023  Chief Complaint(s) Shortness of Breath  HPI Anthony Bullock is a 63 y.o. male history of CHF, COPD, diabetes, end-stage renal disease, hyperlipidemia, hypertension, bilateral BKA presenting to the emergency department with shortness of breath.  Patient reports that he goes to dialysis Tuesday, Thursday, Saturday.  He reports that he was last dialysis on Saturday.  He has plans to get dialysis tomorrow.  He reports that he felt short of breath so he called paramedics.  No chest pain.  No cough.  No fevers or chills.  No runny nose or sore throat.  He feels that he might need dialysis sooner than tomorrow morning.   Past Medical History Past Medical History:  Diagnosis Date   AICD (automatic cardioverter/defibrillator) present    [employer]   Allergic rhinitis    Anemia    Arthritis    Chronic systolic heart failure (HCC)    a. ECHO (12/2012) EF 25-30%, HK entireanteroseptal myocardium //  b.  EF 25%, diffuse HK, grade 1 diastolic dysfunction, MAC, mild LAE, normal RVSF, trivial pericardial effusion   COPD (chronic obstructive pulmonary disease) (HCC)    Diabetes mellitus type II    Diabetic nephropathy (HCC)    Diabetic neuropathy (HCC)    ESRD on hemodialysis (HCC)    started HD June 2017, goes to [facility] HD unit, Dr Fausto Skillern   History of cardiac catheterization    a.Myoview 1/15:  There is significant left ventricular dysfunction. There may be slight scar at the apex. There is no significant ischemia. LV Ejection Fraction: 27%  //  b. RHC/LHC (1/15) with mean RA 6, PA 47/22 mean 33, mean PCWP 20, PVR 2.5 WU, CI 2.5; 80% dLAD stenosis, 70% diffuse large D.     History of kidney stones    Hyperlipidemia    Hypertension    Kidney stones    NICM (nonischemic cardiomyopathy) (HCC)    Primarily nonischemic.  Echo (12/14) with EF 25-30%.  Echo (3/15) with  EF 25%, mild to moderately dilated LV, normal RV size and systolic function.     Osteomyelitis (HCC)    left fifth ray   Pneumonia    Urethral stricture    Wears glasses    Patient Active Problem List   Diagnosis Date Noted   Chest pain 03/07/2023   Volume overload 09/24/2022   Pneumonia due to COVID-19 virus 09/06/2022   Acute on chronic HFrEF (heart failure with reduced ejection fraction) (HCC) 09/06/2022   Atrial fibrillation (HCC) 08/23/2022   Pneumonia due to infectious organism 08/18/2022   Stenosis of intracranial portion of both internal carotid arteries 08/09/2022   Diplopia 08/09/2022   Left carotid stenosis 08/09/2022   Malnutrition of moderate degree 11/18/2019   Below-knee amputation of left lower extremity (HCC) 11/16/2019   Abscess of left foot 11/13/2019   Dehiscence of amputation stump (HCC)    History of complete ray amputation of fifth toe of left foot (HCC) 10/09/2019   Acute pulmonary edema (HCC)    Thrombocytopenia (HCC) 08/17/2019   Pressure injury of back, stage 2 (HCC) 08/17/2019   Hypertensive emergency 07/10/2019   Onychomycosis 08/16/2016   Osteopathy in diseases classified elsewhere, unspecified site 05/14/2016   S/P bilateral below knee amputation (HCC) 04/26/2016   Bilateral carotid bruits 03/30/2016   Diabetes mellitus with complication (HCC)    Anemia due to chronic kidney disease    ESRD  on dialysis [facility]) 11/30/2015   Acute respiratory failure with hypoxia (HCC) 11/29/2015   Dependence on renal dialysis (HCC) 08/15/2015   Presence of automatic (implantable) cardiac defibrillator 03/12/2015   At high risk for falls 09/29/2014   Peripheral vascular disease (HCC) 09/29/2014   Osteoarthritis of both knees 01/07/2014   Osteoarthritis of multiple joints 10/06/2013   NICM (nonischemic cardiomyopathy) (HCC) 06/27/2013   CAD (coronary artery disease) 03/13/2013   Abnormal nuclear stress test 01/27/2013   Chronic systolic congestive heart failure  (HCC) 01/27/2013   COPD (chronic obstructive pulmonary disease) (HCC) 12/27/2012   Diabetic peripheral neuropathy (HCC) 02/10/2012   Urethral stricture 10/06/2010   Adjustment disorder with depressed mood 08/21/2009   Type 2 diabetes mellitus with hyperlipidemia (HCC) 04/14/2008   Hyperlipidemia 04/14/2008   Essential hypertension 04/14/2008   Allergic rhinitis 04/14/2008   Home Medication(s) Prior to Admission medications   Medication Sig Start Date End Date Taking? Authorizing Provider  acetaminophen (TYLENOL) 325 MG tablet Take 1-2 tablets (325-650 mg total) by mouth every 4 (four) hours as needed for mild pain. 11/29/19   Love, Evlyn Kanner, PA-C  apixaban (ELIQUIS) 5 MG TABS tablet Take 1 tablet (5 mg total) by mouth 2 (two) times daily. 12/21/22   Burchette, Elberta Fortis, MD  atorvastatin (LIPITOR) 80 MG tablet TAKE 1 TABLET BY MOUTH AT BEDTIME Patient taking differently: Take 80 mg by mouth daily. 06/24/22   Burchette, Elberta Fortis, MD  carvedilol (COREG) 12.5 MG tablet Take 1 tablet (12.5 mg total) by mouth 2 (two) times daily. 01/25/23   Burchette, Elberta Fortis, MD  cinacalcet (SENSIPAR) 30 MG tablet Take 1 tablet (30 mg total) by mouth Every Tuesday,Thursday,and Saturday with dialysis. 11/30/19   Love, Evlyn Kanner, PA-C  Continuous Glucose Receiver (FREESTYLE LIBRE 3 READER) DEVI Use to check blood glucose TID 08/11/22   Burchette, Elberta Fortis, MD  Continuous Glucose Sensor (FREESTYLE LIBRE 3 PLUS SENSOR) MISC Change sensor every 15 days. Use as directed to check glucose daily 12/21/22   Burchette, Elberta Fortis, MD  fenofibrate 160 MG tablet TAKE 1 TABLET BY MOUTH ONCE DAILY Patient taking differently: Take 160 mg by mouth daily. 06/24/22   Burchette, Elberta Fortis, MD  gabapentin (NEURONTIN) 300 MG capsule TAKE 2 CAPSULES BY MOUTH TWICE DAILY . Patient taking differently: Take 600 mg by mouth 2 (two) times daily. 06/24/22   Burchette, Elberta Fortis, MD  glucose blood test strip Check 1 time daily. E11.9 One Touch Ultra Blue Test  Strips 03/10/14   Burchette, Elberta Fortis, MD  HUMALOG KWIKPEN 200 UNIT/ML KwikPen INJECT A MAXIMUM OF 28 UNITS SUBCUTANEOUSLY TWICE DAILY WITH LUNCH AND SUPPER PER SLIDING SCALE. APPOINTMENT REQUIRED FOR FUTURE REFILLS 11/11/22   Burchette, Elberta Fortis, MD  Insulin Pen Needle (BD PEN NEEDLE NANO U/F) 32G X 4 MM MISC USE 1 PEN NEEDLE SUBCUTANEOUSLY WITH INSULIN 4 TIMES DAILY 12/04/19   Burchette, Elberta Fortis, MD  LANTUS SOLOSTAR 100 UNIT/ML Solostar Pen INJECT 12 UNITS SUBCUTANEOUSLY AT BEDTIME Patient taking differently: Inject 14 Units into the skin at bedtime. 07/15/22   Burchette, Elberta Fortis, MD  midodrine (PROAMATINE) 10 MG tablet Take 10 mg by mouth 3 (three) times a week. EVERY TUESDAY, THURSDAY, AND SATURDAY 08/04/20   [provider]  multivitamin (RENA-VIT) TABS tablet Take 1 tablet by mouth once daily Patient taking differently: Take 1 tablet by mouth daily. 07/06/21   Burchette, Elberta Fortis, MD  Olopatadine HCl 0.2 % SOLN Place 1 drop into both eyes daily as needed (  for allergies).     [provider]  pantoprazole (PROTONIX) 20 MG tablet Take 20 mg by mouth daily. 02/28/23   [provider]  sevelamer (RENAGEL) 800 MG tablet Take 1,600 mg by mouth 3 (three) times daily with meals.    [provider]  VENTOLIN HFA 108 (90 Base) MCG/ACT inhaler Inhale 1-2 puffs into the lungs every 6 (six) hours as needed for wheezing or shortness of breath. 03/20/23   Kristian Covey, MD                                                                                                                                    Past Surgical History Past Surgical History:  Procedure Laterality Date   ABDOMINAL AORTOGRAM W/LOWER EXTREMITY N/A 03/30/2016   Procedure: Abdominal Aortogram w/Lower Extremity;  Surgeon: Chuck Hint, MD;  Location: [facility] INVASIVE CV LAB;  Service: Cardiovascular;  Laterality: N/A;   AMPUTATION Right 04/26/2016   Procedure: Right Below Knee Amputation;  Surgeon: Nadara Mustard, MD;  Location: [facility] OR;  Service: Orthopedics;  Laterality: Right;   AMPUTATION Left 08/21/2019   Procedure: LEFT FOOT 5TH RAY AMPUTATION;  Surgeon: Nadara Mustard, MD;  Location: [facility] OR;  Service: Orthopedics;  Laterality: Left;   AMPUTATION Left 11/13/2019   Procedure: LEFT BELOW KNEE AMPUTATION;  Surgeon: Nadara Mustard, MD;  Location: [facility] OR;  Service: Orthopedics;  Laterality: Left;   AV FISTULA PLACEMENT Right 09/08/2015   Procedure: INSERTION OF 4-26mm x 45cm  ARTERIOVENOUS (AV) GORE-TEX GRAFT RIGHT UPPER  ARM;  Surgeon: Chuck Hint, MD;  Location: [facility] OR;  Service: Vascular;  Laterality: Right;   AV FISTULA PLACEMENT Left 01/14/2016   Procedure: CREATION OF LEFT UPPER ARM ARTERIOVENOUS FISTULA;  Surgeon: Chuck Hint, MD;  Location: [facility] OR;  Service: Vascular;  Laterality: Left;   BASCILIC VEIN TRANSPOSITION Right 08/22/2014   Procedure: RIGHT UPPER ARM BASCILIC VEIN TRANSPOSITION;  Surgeon: Chuck Hint, MD;  Location: [facility] OR;  Service: Vascular;  Laterality: Right;   BELOW KNEE LEG AMPUTATION Right 04/26/2016   CARDIAC CATHETERIZATION     CARDIAC DEFIBRILLATOR PLACEMENT  06/27/2013   Sub Q       BY DR Graciela Husbands   CATARACT EXTRACTION W/PHACO Right 08/06/2018   Procedure: CATARACT EXTRACTION PHACO AND INTRAOCULAR LENS PLACEMENT (IOC);  Surgeon: Fabio Pierce, MD;  Location: [facility] ORS;  Service: Ophthalmology;  Laterality: Right;  CDE: 4.06   CATARACT EXTRACTION W/PHACO Left 08/20/2018   Procedure: CATARACT EXTRACTION PHACO AND INTRAOCULAR LENS PLACEMENT (IOC);  Surgeon: Fabio Pierce, MD;  Location: [facility] ORS;  Service: Ophthalmology;  Laterality: Left;  CDE: 6.76   COLONOSCOPY WITH PROPOFOL N/A 07/22/2015   Procedure: COLONOSCOPY WITH PROPOFOL;  Surgeon: Sherrilyn Rist, MD;  Location: [facility] ENDOSCOPY;  Service: Gastroenterology;  Laterality: N/A;   FEMORAL-POPLITEAL BYPASS GRAFT Right 03/31/2016   Procedure: BYPASS GRAFT FEMORAL-POPLITEAL ARTERY USING RIGHT  GREATER SAPHENOUS  NONREVERSED VEIN;  Surgeon: Chuck Hint, MD;  Location: [facility] OR;  Service: Vascular;  Laterality: Right;   HERNIA REPAIR     I & D EXTREMITY Right 03/31/2016   Procedure: IRRIGATION AND DEBRIDEMENT FOOT;  Surgeon: Chuck Hint, MD;  Location: [facility] OR;  Service: Vascular;  Laterality: Right;   IMPLANTABLE CARDIOVERTER DEFIBRILLATOR IMPLANT N/A 06/27/2013   Procedure: SUB Q ICD;  Surgeon: Duke Salvia, MD;  Location: [facility] CATH LAB;  Service: Cardiovascular;  Laterality: N/A;   INTRAOPERATIVE ARTERIOGRAM Right 03/31/2016   Procedure: INTRA OPERATIVE ARTERIOGRAM;  Surgeon: Chuck Hint, MD;  Location: [facility] OR;  Service: Vascular;  Laterality: Right;   IR GENERIC HISTORICAL Right 11/30/2015   IR THROMBECTOMY AV FISTULA W/THROMBOLYSIS/PTA INC/SHUNT/IMG RIGHT 11/30/2015 Irish Lack, MD [facility]   IR GENERIC HISTORICAL  11/30/2015   IR US GUIDE VASC ACCESS RIGHT 11/30/2015 Irish Lack, MD [facility]   IR GENERIC HISTORICAL Right 12/15/2015   IR THROMBECTOMY AV FISTULA W/THROMBOLYSIS/PTA/STENT INC/SHUNT/IMG RT 12/15/2015 Oley Balm, MD [facility]   IR GENERIC HISTORICAL  12/15/2015   IR US GUIDE VASC ACCESS RIGHT 12/15/2015 Oley Balm, MD [facility]   IR GENERIC HISTORICAL  12/28/2015   IR FLUORO GUIDE CV LINE RIGHT 12/28/2015 Jolaine Click, MD [facility]   IR GENERIC HISTORICAL  12/28/2015   IR US GUIDE VASC ACCESS RIGHT 12/28/2015 Jolaine Click, MD [facility]   LEFT A ND RIGHT HEART CATH  01/30/2013   DR Jones Broom   LEFT AND RIGHT HEART CATHETERIZATION WITH CORONARY ANGIOGRAM N/A 01/30/2013   Procedure: LEFT AND RIGHT HEART CATHETERIZATION WITH CORONARY ANGIOGRAM;  Surgeon: Dolores Patty, MD;  Location: [facility] CATH LAB;  Service: Cardiovascular;  Laterality: N/A;   PERIPHERAL VASCULAR CATHETERIZATION Right 01/26/2015   Procedure: A/V Fistulagram;  Surgeon: Chuck Hint, MD;  Location: [facility] INVASIVE CV LAB;  Service: Cardiovascular;  Laterality:  Right;   reapea urethral surgery for recurrent obstruction  2011   TOTAL KNEE ARTHROPLASTY Right 2007   VEIN HARVEST Right 03/31/2016   Procedure: RIGHT GREATER SAPHENOUS VEIN HARVEST;  Surgeon: Chuck Hint, MD;  Location: [facility] OR;  Service: Vascular;  Laterality: Right;   Family History Family History  Problem Relation Age of Onset   Bladder Cancer Mother    Alcohol abuse Father    Melanoma Father    Stroke Maternal Grandmother    Heart Problems Maternal Grandmother        unknown   Diabetes Maternal Grandmother    Heart disease Maternal Grandfather    Prostate cancer Maternal Grandfather     Social History Social History   Tobacco Use   Smoking status: Former    Current packs/day: 0.00    Average packs/day: 2.0 packs/day for 32.0 years (64.0 ttl pk-yrs)    Types: Cigarettes    Start date: 05/11/1978    Quit date: 05/11/2010    Years since quitting: 12.9   Smokeless tobacco: Never  Vaping Use   Vaping status: Never Used  Substance Use Topics   Alcohol use: No   Drug use: No   Allergies Epoetin alfa, Ferumoxytol, and Morphine sulfate  Review of Systems Review of Systems  All other systems reviewed and are negative.   Physical Exam Vital Signs  I have reviewed the triage vital signs BP (!) 138/47   Pulse 92   Temp 97.8 F (36.6 C) (Oral)   Resp 18   Ht 5\' 10"  (1.778 m)   Wt  94 kg   SpO2 97%   BMI 29.73 kg/m  Physical Exam Vitals and nursing note reviewed.  Constitutional:      General: He is not in acute distress.    Appearance: Normal appearance.  HENT:     Mouth/Throat:     Mouth: Mucous membranes are moist.  Eyes:     Conjunctiva/sclera: Conjunctivae normal.  Neck:     Vascular: No JVD.  Cardiovascular:     Rate and Rhythm: Normal rate and regular rhythm.  Pulmonary:     Effort: Pulmonary effort is normal. No respiratory distress.     Breath sounds: Normal breath sounds.  Abdominal:     General: Abdomen is flat.     Palpations:  Abdomen is soft.     Tenderness: There is no abdominal tenderness.  Musculoskeletal:     Right lower leg: No edema.     Left lower leg: No edema.  Skin:    General: Skin is warm and dry.     Capillary Refill: Capillary refill takes less than 2 seconds.  Neurological:     Mental Status: He is alert and oriented to person, place, and time. Mental status is at baseline.  Psychiatric:        Mood and Affect: Mood normal.        Behavior: Behavior normal.     ED Results and Treatments Labs (all labs ordered are listed, but only abnormal results are displayed) Labs Reviewed  COMPREHENSIVE METABOLIC PANEL WITH GFR - Abnormal; Notable for the following components:      Result Value   Sodium 130 (*)    Chloride 92 (*)    CO2 20 (*)    Glucose, Bld 138 (*)    BUN 78 (*)    Creatinine, Ser 10.44 (*)    Calcium 8.7 (*)    GFR, Estimated 5 (*)    Anion gap 18 (*)    All other components within normal limits  CBC WITH DIFFERENTIAL/PLATELET - Abnormal; Notable for the following components:   WBC 11.5 (*)    RBC 4.12 (*)    Hemoglobin 12.2 (*)    HCT 36.5 (*)    Neutro Abs 8.6 (*)    Monocytes Absolute 1.2 (*)    All other components within normal limits  TROPONIN I (HIGH SENSITIVITY) - Abnormal; Notable for the following components:   Troponin I (High Sensitivity) 176 (*)    All other components within normal limits  TROPONIN I (HIGH SENSITIVITY)                                                                                                                          Radiology DG Chest Portable 1 View Result Date: 04/10/2023 CLINICAL DATA:  Short of breath, COPD EXAM: PORTABLE CHEST 1 VIEW COMPARISON:  03/06/2023 FINDINGS: Single frontal view of the chest demonstrates stable right internal jugular dialysis catheter and single lead defibrillator. Cardiac silhouette is stable. No acute airspace disease,  effusion, or pneumothorax. No acute bony abnormalities. IMPRESSION: 1. Stable chest,  no acute process. Electronically Signed   By: Sharlet Salina M.D.   On: 04/10/2023 21:52    Pertinent labs & imaging results that were available during my care of the patient were reviewed by me and considered in my medical decision making (see MDM for details).  Medications Ordered in ED Medications - No data to display                                                                                                                                   Procedures Procedures  (including critical care time)  Medical Decision Making / ED Course   MDM:  63 year old male presenting to the emergency department shortness of breath.  Patient overall well-appearing, physical examination with no focal findings.  Does not appear volume overloaded on bedside exam.  Lungs are clear clinically.  He is not hypoxic on room air.  Unclear cause of symptoms, low concern for hypervolemia.  Chest x-ray without signs of pneumonia, pneumothorax.  Patient does report some nonspecific chest pain, his EKG is stable, initial troponin is somewhat more elevated than previous, but would be elevated in the setting of end-stage renal disease.  Will obtain delta troponin.  Clinically, do not think patient needs urgent dialysis.  If delta troponin is stable, patient could possibly be discharged dialysis tomorrow.  If uptrending may need admission. Clinical Course as of 04/10/23 2343  Mon Apr 10, 2023  2342 Signed out to oncoming provider pending repeat troponin [WS]    Clinical Course User Index [WS] Lonell Grandchild, MD     Additional history obtained: -Additional history obtained from ems -External records from outside source obtained and reviewed including: Chart review including previous notes, labs, imaging, consultation notes including prior notes    Lab Tests: -I ordered, reviewed, and interpreted labs.   The pertinent results include:   Labs Reviewed  COMPREHENSIVE METABOLIC PANEL WITH GFR - Abnormal;  Notable for the following components:      Result Value   Sodium 130 (*)    Chloride 92 (*)    CO2 20 (*)    Glucose, Bld 138 (*)    BUN 78 (*)    Creatinine, Ser 10.44 (*)    Calcium 8.7 (*)    GFR, Estimated 5 (*)    Anion gap 18 (*)    All other components within normal limits  CBC WITH DIFFERENTIAL/PLATELET - Abnormal; Notable for the following components:   WBC 11.5 (*)    RBC 4.12 (*)    Hemoglobin 12.2 (*)    HCT 36.5 (*)    Neutro Abs 8.6 (*)    Monocytes Absolute 1.2 (*)    All other components within normal limits  TROPONIN I (HIGH SENSITIVITY) - Abnormal; Notable for the following components:   Troponin I (High Sensitivity) 176 (*)    All other  components within normal limits  TROPONIN I (HIGH SENSITIVITY)    Notable for elevated troponin  EKG   EKG Interpretation Date/Time:  Monday April 10 2023 20:33:20 EDT Ventricular Rate:  105 PR Interval:  185 QRS Duration:  139 QT Interval:  366 QTC Calculation: 484 R Axis:   -47  Text Interpretation: Sinus tachycardia Left bundle branch block Confirmed by Alvino Blood ([id number]) on 04/10/2023 11:13:26 PM         Imaging Studies ordered: I ordered imaging studies including CXR On my interpretation imaging demonstrates no acute process I independently visualized and interpreted imaging. I agree with the radiologist interpretation   Medicines ordered and prescription drug management: No orders of the defined types were placed in this encounter.   -I have reviewed the patients home medicines and have made adjustments as needed     Reevaluation: After the interventions noted above, I reevaluated the patient and found that their symptoms have improved  Co morbidities that complicate the patient evaluation  Past Medical History:  Diagnosis Date   AICD (automatic cardioverter/defibrillator) present    [employer]   Allergic rhinitis    Anemia    Arthritis    Chronic systolic heart failure (HCC)     a. ECHO (12/2012) EF 25-30%, HK entireanteroseptal myocardium //  b.  EF 25%, diffuse HK, grade 1 diastolic dysfunction, MAC, mild LAE, normal RVSF, trivial pericardial effusion   COPD (chronic obstructive pulmonary disease) (HCC)    Diabetes mellitus type II    Diabetic nephropathy (HCC)    Diabetic neuropathy (HCC)    ESRD on hemodialysis (HCC)    started HD June 2017, goes to [facility] HD unit, Dr Fausto Skillern   History of cardiac catheterization    a.Myoview 1/15:  There is significant left ventricular dysfunction. There may be slight scar at the apex. There is no significant ischemia. LV Ejection Fraction: 27%  //  b. RHC/LHC (1/15) with mean RA 6, PA 47/22 mean 33, mean PCWP 20, PVR 2.5 WU, CI 2.5; 80% dLAD stenosis, 70% diffuse large D.     History of kidney stones    Hyperlipidemia    Hypertension    Kidney stones    NICM (nonischemic cardiomyopathy) (HCC)    Primarily nonischemic.  Echo (12/14) with EF 25-30%.  Echo (3/15) with EF 25%, mild to moderately dilated LV, normal RV size and systolic function.     Osteomyelitis (HCC)    left fifth ray   Pneumonia    Urethral stricture    Wears glasses       Dispostion: Disposition decision including need for hospitalization was considered, and patient discharged from emergency department.    Final Clinical Impression(s) / ED Diagnoses Final diagnoses:  Dyspnea, unspecified type     This chart was dictated using voice recognition software.  Despite best efforts to proofread,  errors can occur which can change the documentation meaning.    Lonell Grandchild, MD 04/10/23 [phone]

## 2023-04-11 ENCOUNTER — Inpatient Hospital Stay (HOSPITAL_COMMUNITY)

## 2023-04-11 ENCOUNTER — Other Ambulatory Visit (HOSPITAL_COMMUNITY): Payer: Self-pay | Admitting: *Deleted

## 2023-04-11 DIAGNOSIS — I25110 Atherosclerotic heart disease of native coronary artery with unstable angina pectoris: Secondary | ICD-10-CM | POA: Diagnosis not present

## 2023-04-11 DIAGNOSIS — E785 Hyperlipidemia, unspecified: Secondary | ICD-10-CM | POA: Insufficient documentation

## 2023-04-11 DIAGNOSIS — E8779 Other fluid overload: Secondary | ICD-10-CM | POA: Diagnosis not present

## 2023-04-11 DIAGNOSIS — I5042 Chronic combined systolic (congestive) and diastolic (congestive) heart failure: Secondary | ICD-10-CM | POA: Diagnosis present

## 2023-04-11 DIAGNOSIS — Z992 Dependence on renal dialysis: Secondary | ICD-10-CM | POA: Diagnosis not present

## 2023-04-11 DIAGNOSIS — E871 Hypo-osmolality and hyponatremia: Secondary | ICD-10-CM | POA: Diagnosis present

## 2023-04-11 DIAGNOSIS — E1151 Type 2 diabetes mellitus with diabetic peripheral angiopathy without gangrene: Secondary | ICD-10-CM | POA: Diagnosis present

## 2023-04-11 DIAGNOSIS — D509 Iron deficiency anemia, unspecified: Secondary | ICD-10-CM | POA: Diagnosis not present

## 2023-04-11 DIAGNOSIS — K219 Gastro-esophageal reflux disease without esophagitis: Secondary | ICD-10-CM | POA: Diagnosis present

## 2023-04-11 DIAGNOSIS — I5022 Chronic systolic (congestive) heart failure: Secondary | ICD-10-CM | POA: Diagnosis not present

## 2023-04-11 DIAGNOSIS — Z794 Long term (current) use of insulin: Secondary | ICD-10-CM | POA: Diagnosis not present

## 2023-04-11 DIAGNOSIS — N250 Renal osteodystrophy: Secondary | ICD-10-CM | POA: Diagnosis not present

## 2023-04-11 DIAGNOSIS — Z89511 Acquired absence of right leg below knee: Secondary | ICD-10-CM | POA: Diagnosis not present

## 2023-04-11 DIAGNOSIS — I2489 Other forms of acute ischemic heart disease: Secondary | ICD-10-CM | POA: Diagnosis present

## 2023-04-11 DIAGNOSIS — E1122 Type 2 diabetes mellitus with diabetic chronic kidney disease: Secondary | ICD-10-CM | POA: Diagnosis present

## 2023-04-11 DIAGNOSIS — N186 End stage renal disease: Secondary | ICD-10-CM

## 2023-04-11 DIAGNOSIS — I2510 Atherosclerotic heart disease of native coronary artery without angina pectoris: Secondary | ICD-10-CM | POA: Diagnosis not present

## 2023-04-11 DIAGNOSIS — I480 Paroxysmal atrial fibrillation: Secondary | ICD-10-CM | POA: Diagnosis present

## 2023-04-11 DIAGNOSIS — E8889 Other specified metabolic disorders: Secondary | ICD-10-CM | POA: Diagnosis present

## 2023-04-11 DIAGNOSIS — J449 Chronic obstructive pulmonary disease, unspecified: Secondary | ICD-10-CM | POA: Diagnosis present

## 2023-04-11 DIAGNOSIS — Z89512 Acquired absence of left leg below knee: Secondary | ICD-10-CM | POA: Diagnosis not present

## 2023-04-11 DIAGNOSIS — L89312 Pressure ulcer of right buttock, stage 2: Secondary | ICD-10-CM | POA: Diagnosis present

## 2023-04-11 DIAGNOSIS — L89322 Pressure ulcer of left buttock, stage 2: Secondary | ICD-10-CM | POA: Diagnosis present

## 2023-04-11 DIAGNOSIS — I428 Other cardiomyopathies: Secondary | ICD-10-CM | POA: Diagnosis present

## 2023-04-11 DIAGNOSIS — I214 Non-ST elevation (NSTEMI) myocardial infarction: Secondary | ICD-10-CM

## 2023-04-11 DIAGNOSIS — I132 Hypertensive heart and chronic kidney disease with heart failure and with stage 5 chronic kidney disease, or end stage renal disease: Secondary | ICD-10-CM | POA: Diagnosis present

## 2023-04-11 DIAGNOSIS — Z8616 Personal history of COVID-19: Secondary | ICD-10-CM | POA: Diagnosis not present

## 2023-04-11 DIAGNOSIS — R0602 Shortness of breath: Secondary | ICD-10-CM | POA: Diagnosis not present

## 2023-04-11 DIAGNOSIS — Z955 Presence of coronary angioplasty implant and graft: Secondary | ICD-10-CM | POA: Diagnosis not present

## 2023-04-11 DIAGNOSIS — E1142 Type 2 diabetes mellitus with diabetic polyneuropathy: Secondary | ICD-10-CM | POA: Diagnosis present

## 2023-04-11 DIAGNOSIS — D631 Anemia in chronic kidney disease: Secondary | ICD-10-CM | POA: Diagnosis present

## 2023-04-11 DIAGNOSIS — N2581 Secondary hyperparathyroidism of renal origin: Secondary | ICD-10-CM | POA: Diagnosis present

## 2023-04-11 LAB — CBC
HCT: 32.5 % — ABNORMAL LOW (ref 39.0–52.0)
Hemoglobin: 10.6 g/dL — ABNORMAL LOW (ref 13.0–17.0)
MCH: 29.4 pg (ref 26.0–34.0)
MCHC: 32.6 g/dL (ref 30.0–36.0)
MCV: 90.3 fL (ref 80.0–100.0)
Platelets: 187 10*3/uL (ref 150–400)
RBC: 3.6 MIL/uL — ABNORMAL LOW (ref 4.22–5.81)
RDW: 14 % (ref 11.5–15.5)
WBC: 10.3 10*3/uL (ref 4.0–10.5)
nRBC: 0 % (ref 0.0–0.2)

## 2023-04-11 LAB — GLUCOSE, CAPILLARY
Glucose-Capillary: 168 mg/dL — ABNORMAL HIGH (ref 70–99)
Glucose-Capillary: 232 mg/dL — ABNORMAL HIGH (ref 70–99)

## 2023-04-11 LAB — LIPID PANEL
Cholesterol: 78 mg/dL (ref 0–200)
HDL: 30 mg/dL — ABNORMAL LOW (ref >40)
LDL Cholesterol: 7 mg/dL (ref 0–99)
Total CHOL/HDL Ratio: 2.6 ratio
Triglycerides: 203 mg/dL — ABNORMAL HIGH (ref <150)
VLDL: 41 mg/dL — ABNORMAL HIGH (ref 0–40)

## 2023-04-11 LAB — ECHOCARDIOGRAM COMPLETE
AR max vel: 2.1 cm2
AV Area VTI: 2.11 cm2
AV Area mean vel: 1.83 cm2
AV Mean grad: 2 mmHg
AV Peak grad: 3.7 mmHg
Ao pk vel: 0.97 m/s
Area-P 1/2: 4.89 cm2
Est EF: 40
Height: 70 in
S' Lateral: 4.4 cm
Weight: 3315.72 [oz_av]

## 2023-04-11 LAB — BASIC METABOLIC PANEL WITH GFR
Anion gap: 15 (ref 5–15)
BUN: 83 mg/dL — ABNORMAL HIGH (ref 8–23)
CO2: 19 mmol/L — ABNORMAL LOW (ref 22–32)
Calcium: 8.4 mg/dL — ABNORMAL LOW (ref 8.9–10.3)
Chloride: 95 mmol/L — ABNORMAL LOW (ref 98–111)
Creatinine, Ser: 10.82 mg/dL — ABNORMAL HIGH (ref 0.61–1.24)
GFR, Estimated: 5 mL/min — ABNORMAL LOW (ref >60)
Glucose, Bld: 120 mg/dL — ABNORMAL HIGH (ref 70–99)
Potassium: 3.8 mmol/L (ref 3.5–5.1)
Sodium: 129 mmol/L — ABNORMAL LOW (ref 135–145)

## 2023-04-11 LAB — CBG MONITORING, ED
Glucose-Capillary: 120 mg/dL — ABNORMAL HIGH (ref 70–99)
Glucose-Capillary: 111 mg/dL — ABNORMAL HIGH (ref 70–99)

## 2023-04-11 LAB — APTT
aPTT: 142 s — ABNORMAL HIGH (ref 24–36)
aPTT: 120 s — ABNORMAL HIGH (ref 24–36)

## 2023-04-11 LAB — TROPONIN I (HIGH SENSITIVITY)
Troponin I (High Sensitivity): 2147 ng/L (ref <18)
Troponin I (High Sensitivity): 9544 ng/L (ref <18)
Troponin I (High Sensitivity): 15002 ng/L (ref <18)
Troponin I (High Sensitivity): 16311 ng/L (ref <18)

## 2023-04-11 LAB — PROTIME-INR
INR: 1.4 — ABNORMAL HIGH (ref 0.8–1.2)
Prothrombin Time: 17.3 s — ABNORMAL HIGH (ref 11.4–15.2)

## 2023-04-11 LAB — HEPATITIS B SURFACE ANTIGEN: Hepatitis B Surface Ag: NONREACTIVE

## 2023-04-11 MED ORDER — GABAPENTIN 300 MG PO CAPS
600.0000 mg | ORAL_CAPSULE | Freq: Two times a day (BID) | ORAL | Status: DC
  Administered 2023-04-11 – 2023-04-13 (×4): 600 mg via ORAL
  Filled 2023-04-11 (×4): qty 2

## 2023-04-11 MED ORDER — ONDANSETRON HCL 4 MG/2ML IJ SOLN: 4.0000 mg | Freq: Four times a day (QID) | INTRAMUSCULAR | Status: DC | PRN

## 2023-04-11 MED ORDER — NITROGLYCERIN 0.4 MG SL SUBL: 0.4000 mg | SUBLINGUAL_TABLET | SUBLINGUAL | Status: DC | PRN

## 2023-04-11 MED ORDER — INSULIN GLARGINE-YFGN 100 UNIT/ML ~~LOC~~ SOLN
12.0000 [IU] | Freq: Every day | SUBCUTANEOUS | Status: DC
  Administered 2023-04-12 – 2023-04-15 (×4): 12 [IU] via SUBCUTANEOUS
  Filled 2023-04-11 (×8): qty 0.12

## 2023-04-11 MED ORDER — ASPIRIN 81 MG PO TBEC
81.0000 mg | DELAYED_RELEASE_TABLET | Freq: Every day | ORAL | Status: DC
  Administered 2023-04-13 – 2023-04-15 (×2): 81 mg via ORAL
  Filled 2023-04-11 (×2): qty 1

## 2023-04-11 MED ORDER — CARVEDILOL 12.5 MG PO TABS
12.5000 mg | ORAL_TABLET | Freq: Two times a day (BID) | ORAL | Status: DC
  Administered 2023-04-11 – 2023-04-14 (×7): 12.5 mg via ORAL
  Filled 2023-04-11 (×5): qty 1
  Filled 2023-04-11: qty 4
  Filled 2023-04-11: qty 1

## 2023-04-11 MED ORDER — ASPIRIN 300 MG RE SUPP: 300.0000 mg | RECTAL | Status: AC

## 2023-04-11 MED ORDER — FENTANYL CITRATE PF 50 MCG/ML IJ SOSY: 12.5000 ug | PREFILLED_SYRINGE | INTRAMUSCULAR | Status: DC | PRN

## 2023-04-11 MED ORDER — ACETAMINOPHEN 325 MG PO TABS: 650.0000 mg | ORAL_TABLET | ORAL | Status: DC | PRN

## 2023-04-11 MED ORDER — ASPIRIN 81 MG PO CHEW
81.0000 mg | CHEWABLE_TABLET | ORAL | Status: AC
  Administered 2023-04-12: 81 mg via ORAL
  Filled 2023-04-11: qty 1

## 2023-04-11 MED ORDER — SODIUM CHLORIDE 0.9 % IV SOLN
INTRAVENOUS | Status: DC
Start: 2023-04-11 — End: 2023-04-11

## 2023-04-11 MED ORDER — CINACALCET HCL 30 MG PO TABS
30.0000 mg | ORAL_TABLET | ORAL | Status: DC
  Administered 2023-04-13 – 2023-04-15 (×2): 30 mg via ORAL
  Filled 2023-04-11 (×3): qty 1

## 2023-04-11 MED ORDER — SEVELAMER CARBONATE 800 MG PO TABS
1600.0000 mg | ORAL_TABLET | Freq: Three times a day (TID) | ORAL | Status: DC
  Administered 2023-04-12 – 2023-04-15 (×4): 1600 mg via ORAL
  Filled 2023-04-11 (×4): qty 2

## 2023-04-11 MED ORDER — PERFLUTREN LIPID MICROSPHERE
1.0000 mL | INTRAVENOUS | Status: AC | PRN
  Administered 2023-04-11: 3 mL via INTRAVENOUS

## 2023-04-11 MED ORDER — SODIUM CHLORIDE 0.9 % IV SOLN: INTRAVENOUS | Status: DC

## 2023-04-11 MED ORDER — PANTOPRAZOLE SODIUM 40 MG PO TBEC
40.0000 mg | DELAYED_RELEASE_TABLET | Freq: Every day | ORAL | Status: DC
  Administered 2023-04-11 – 2023-04-15 (×4): 40 mg via ORAL
  Filled 2023-04-11 (×4): qty 1

## 2023-04-11 MED ORDER — HEPARIN (PORCINE) 25000 UT/250ML-% IV SOLN
1100.0000 [IU]/h | INTRAVENOUS | Status: DC
  Administered 2023-04-11: 1200 [IU]/h via INTRAVENOUS
  Administered 2023-04-12: 1100 [IU]/h via INTRAVENOUS
  Filled 2023-04-11 (×2): qty 250

## 2023-04-11 MED ORDER — FENOFIBRATE 160 MG PO TABS: 160.0000 mg | ORAL_TABLET | Freq: Every day | ORAL | Status: DC

## 2023-04-11 MED ORDER — CHLORHEXIDINE GLUCONATE CLOTH 2 % EX PADS
6.0000 | MEDICATED_PAD | Freq: Every day | CUTANEOUS | Status: DC
  Administered 2023-04-12 – 2023-04-13 (×2): 6 via TOPICAL

## 2023-04-11 MED ORDER — OLOPATADINE HCL 0.1 % OP SOLN: 1.0000 [drp] | Freq: Every day | OPHTHALMIC | Status: DC | PRN

## 2023-04-11 MED ORDER — RENA-VITE PO TABS
1.0000 | ORAL_TABLET | Freq: Every day | ORAL | Status: DC
  Administered 2023-04-13 – 2023-04-15 (×3): 1 via ORAL
  Filled 2023-04-11 (×3): qty 1

## 2023-04-11 MED ORDER — INSULIN ASPART 100 UNIT/ML IJ SOLN
0.0000 [IU] | INTRAMUSCULAR | Status: DC
  Administered 2023-04-11 – 2023-04-12 (×2): 3 [IU] via SUBCUTANEOUS
  Administered 2023-04-12: 5 [IU] via SUBCUTANEOUS
  Administered 2023-04-12: 3 [IU] via SUBCUTANEOUS
  Administered 2023-04-12: 2 [IU] via SUBCUTANEOUS
  Administered 2023-04-12 – 2023-04-13 (×2): 3 [IU] via SUBCUTANEOUS
  Administered 2023-04-14 (×2): 2 [IU] via SUBCUTANEOUS
  Administered 2023-04-15: 3 [IU] via SUBCUTANEOUS
  Administered 2023-04-15: 5 [IU] via SUBCUTANEOUS
  Administered 2023-04-15: 2 [IU] via SUBCUTANEOUS

## 2023-04-11 MED ORDER — TRAZODONE HCL 50 MG PO TABS
25.0000 mg | ORAL_TABLET | Freq: Every evening | ORAL | Status: DC | PRN
  Administered 2023-04-11 – 2023-04-15 (×4): 25 mg via ORAL
  Filled 2023-04-11 (×4): qty 1

## 2023-04-11 MED ORDER — MAGNESIUM HYDROXIDE 400 MG/5ML PO SUSP: 30.0000 mL | Freq: Every day | ORAL | Status: DC | PRN

## 2023-04-11 MED ORDER — ALPRAZOLAM 0.25 MG PO TABS
0.2500 mg | ORAL_TABLET | Freq: Two times a day (BID) | ORAL | Status: DC | PRN
  Filled 2023-04-11: qty 1

## 2023-04-11 MED ORDER — ATORVASTATIN CALCIUM 80 MG PO TABS
80.0000 mg | ORAL_TABLET | Freq: Every day | ORAL | Status: DC
  Administered 2023-04-11 – 2023-04-14 (×4): 80 mg via ORAL
  Filled 2023-04-11 (×4): qty 1

## 2023-04-11 MED ORDER — ASPIRIN 81 MG PO CHEW
324.0000 mg | CHEWABLE_TABLET | ORAL | Status: AC
  Administered 2023-04-11: 324 mg via ORAL
  Filled 2023-04-11: qty 4

## 2023-04-11 MED ORDER — MIDODRINE HCL 5 MG PO TABS
10.0000 mg | ORAL_TABLET | ORAL | Status: DC
  Filled 2023-04-11: qty 2

## 2023-04-11 MED ORDER — FUROSEMIDE 10 MG/ML IJ SOLN
40.0000 mg | Freq: Once | INTRAMUSCULAR | Status: DC
  Filled 2023-04-11: qty 4

## 2023-04-11 NOTE — Progress Notes (Signed)
 Pt unable to UF 3 L as ordered  d/t Pt had hypotension during treatment. Pt taking off 2 L. Dr. Claudina Lick notified.  04/11/23 1740  Vitals  Temp 98.2 F (36.8 C)  Temp Source Oral  BP (!) 121/55  BP Location Right Arm  BP Method Automatic  Patient Position (if appropriate) Lying  Pulse Rate 72  Resp 16  Oxygen Therapy  SpO2 100 %  O2 Device Nasal Cannula  O2 Flow Rate (L/min) 2 L/min  During Treatment Monitoring  Intra-Hemodialysis Comments Tx completed  Post Treatment  Dialyzer Clearance Lightly streaked  Hemodialysis Intake (mL) 0 mL  Liters Processed 72  Fluid Removed (mL) 2000 mL  Tolerated HD Treatment Yes  Post-Hemodialysis Comments Pt goal met.

## 2023-04-11 NOTE — Consult Note (Addendum)
 Cardiology Consultation   Patient ID: Anthony Bullock MRN: [id number]; DOB: 05/31/1960  Admit date: 04/10/2023 Date of Consult: 04/11/2023  PCP:  Anthony Covey, MD   [facility] Providers Cardiologist:  Anthony Rich, MD  Electrophysiologist:  Anthony Manges, MD  {  Patient Profile:   Anthony Bullock is a 62 y.o. male with a hx of chronic HFrEF/NICM s/p Bos Sci S-ICD 06/2013 subsequently left in place after battery ran out, mostly nonobstructive CAD by cath 2015 (diffuse diabetic vasculopathy managed medically), ESRD on HD, HTN then issues with hypotension, HLD, DM2, mild carotid disease (1-39% BICA 12/2022), LBBB, moderate MR by echo 08/2022, osteomyelitis, bilateral BKA, prior gangrenous wound, anemia, arthritis, kidney stones who is being seen 04/11/2023 for the evaluation of chest pain at the request of Anthony Bullock.  History of Present Illness:   Anthony Bullock has had multiple recurrent admissions for issues with respiratory failure, volume overload, PND, previous COVID-19, complicated at times by AF RVR requiring IV amiodarone and mildly elevated troponins.   Patient was last seen during hospitalization on 03/08/2023 for chest pain and shortness of breath.  Troponins not consistent with ACS and reflected demand ischemia.  He responded well to hemodialysis.  Decision to hold off on repeat echo at that time and continue baseline outpatient regimen.  Presented to ED 04/10/23 for CP and SOB. ED exam showed no volume overload with clear lungs. V/S notable for 162/51 with HR of 105. Initial labs: CBC with decreased RBC 4.12, Hgb 12.2, and HCT 36.5, which is chronic and normal. Also elevated CBC diff includes neut 8.6 and Mono 1.2. PT 17.3 and INR 1.4. hsTN 176 >>2147>>9,544 CXR unremarkable.  Initial EKG: Sinus tachycardia, HR 105, LBBB,  widespread ST depression and ST elevation in AVR.  Repeat EKG: NSR, 84, nonspecific T wave changes  Cardiology consulted for chest pain.  On  interview patient reports CP and SOB started around 6 PM yesterday while at rest, some relief from O2 with EMS.  Also noted worse with laying down, however this is a chronic issue. CP described as burning 8/10, located midsternum, no radiation.  Prior to 6 PM yesterday, patient he did more exertional activity than normal without any issues during that time. Denies any diaphoresis, nausea, and vomiting.  Reports this CP as similar to previous episodes. Denies any SOB outside of CP episodes. Prior to arrival, patient reports being 1.7 kg above his dry weight.  He believes this episode is more related to dialysis since in the past similar episodes arise when waiting from Saturday to Tuesday for dialysis. Patient suspect underlying need for dialysis is driver, would prefer dialysis treatment and then would be willing to undergo cath. Currently, no CP or SOB at this time. Last took Eliquis at 6 pm on 04/10/23. His last dialysis was on Saturday, which he reports getting extra fluids taken off and endorses dialysis compliance.  He stopped smoking 14 years ago, approximately 2 PPD, denies any EtOH or drugs.  Endorses low-sodium diet, < 2000 g Na and only 32 oz of fluids daily.   Past Medical History:  Diagnosis Date   AICD (automatic cardioverter/defibrillator) present    [employer]   Allergic rhinitis    Anemia    Arthritis    Chronic systolic heart failure (HCC)    a. ECHO (12/2012) EF 25-30%, HK entireanteroseptal myocardium //  b.  EF 25%, diffuse HK, grade 1 diastolic dysfunction, MAC, mild LAE, normal RVSF, trivial pericardial effusion  COPD (chronic obstructive pulmonary disease) (HCC)    Diabetes mellitus type II    Diabetic nephropathy (HCC)    Diabetic neuropathy (HCC)    ESRD on hemodialysis [facility])    started HD June 2017, goes to [facility] HD unit, Dr Fausto Skillern   History of cardiac catheterization    a.Myoview 1/15:  There is significant left ventricular dysfunction. There may be slight  scar at the apex. There is no significant ischemia. LV Ejection Fraction: 27%  //  b. RHC/LHC (1/15) with mean RA 6, PA 47/22 mean 33, mean PCWP 20, PVR 2.5 WU, CI 2.5; 80% dLAD stenosis, 70% diffuse large D.     History of kidney stones    Hyperlipidemia    Hypertension    Kidney stones    NICM (nonischemic cardiomyopathy) (HCC)    Primarily nonischemic.  Echo (12/14) with EF 25-30%.  Echo (3/15) with EF 25%, mild to moderately dilated LV, normal RV size and systolic function.     Osteomyelitis (HCC)    left fifth ray   Pneumonia    Urethral stricture    Wears glasses     Past Surgical History:  Procedure Laterality Date   ABDOMINAL AORTOGRAM W/LOWER EXTREMITY N/A 03/30/2016   Procedure: Abdominal Aortogram w/Lower Extremity;  Surgeon: Chuck Hint, MD;  Location: [facility] INVASIVE CV LAB;  Service: Cardiovascular;  Laterality: N/A;   AMPUTATION Right 04/26/2016   Procedure: Right Below Knee Amputation;  Surgeon: Nadara Mustard, MD;  Location: [facility] OR;  Service: Orthopedics;  Laterality: Right;   AMPUTATION Left 08/21/2019   Procedure: LEFT FOOT 5TH RAY AMPUTATION;  Surgeon: Nadara Mustard, MD;  Location: [facility] OR;  Service: Orthopedics;  Laterality: Left;   AMPUTATION Left 11/13/2019   Procedure: LEFT BELOW KNEE AMPUTATION;  Surgeon: Nadara Mustard, MD;  Location: [facility] OR;  Service: Orthopedics;  Laterality: Left;   AV FISTULA PLACEMENT Right 09/08/2015   Procedure: INSERTION OF 4-37mm x 45cm  ARTERIOVENOUS (AV) GORE-TEX GRAFT RIGHT UPPER  ARM;  Surgeon: Chuck Hint, MD;  Location: [facility] OR;  Service: Vascular;  Laterality: Right;   AV FISTULA PLACEMENT Left 01/14/2016   Procedure: CREATION OF LEFT UPPER ARM ARTERIOVENOUS FISTULA;  Surgeon: Chuck Hint, MD;  Location: [facility] OR;  Service: Vascular;  Laterality: Left;   BASCILIC VEIN TRANSPOSITION Right 08/22/2014   Procedure: RIGHT UPPER ARM BASCILIC VEIN TRANSPOSITION;  Surgeon: Chuck Hint, MD;  Location: [facility] OR;  Service:  Vascular;  Laterality: Right;   BELOW KNEE LEG AMPUTATION Right 04/26/2016   CARDIAC CATHETERIZATION     CARDIAC DEFIBRILLATOR PLACEMENT  06/27/2013   Sub Q       BY DR Graciela Husbands   CATARACT EXTRACTION W/PHACO Right 08/06/2018   Procedure: CATARACT EXTRACTION PHACO AND INTRAOCULAR LENS PLACEMENT (IOC);  Surgeon: Fabio Pierce, MD;  Location: [facility] ORS;  Service: Ophthalmology;  Laterality: Right;  CDE: 4.06   CATARACT EXTRACTION W/PHACO Left 08/20/2018   Procedure: CATARACT EXTRACTION PHACO AND INTRAOCULAR LENS PLACEMENT (IOC);  Surgeon: Fabio Pierce, MD;  Location: [facility] ORS;  Service: Ophthalmology;  Laterality: Left;  CDE: 6.76   COLONOSCOPY WITH PROPOFOL N/A 07/22/2015   Procedure: COLONOSCOPY WITH PROPOFOL;  Surgeon: Sherrilyn Rist, MD;  Location: [facility] ENDOSCOPY;  Service: Gastroenterology;  Laterality: N/A;   FEMORAL-POPLITEAL BYPASS GRAFT Right 03/31/2016   Procedure: BYPASS GRAFT FEMORAL-POPLITEAL ARTERY USING RIGHT GREATER SAPHENOUS NONREVERSED VEIN;  Surgeon: Chuck Hint, MD;  Location: [facility] OR;  Service: Vascular;  Laterality: Right;  HERNIA REPAIR     I & D EXTREMITY Right 03/31/2016   Procedure: IRRIGATION AND DEBRIDEMENT FOOT;  Surgeon: Chuck Hint, MD;  Location: [facility] OR;  Service: Vascular;  Laterality: Right;   IMPLANTABLE CARDIOVERTER DEFIBRILLATOR IMPLANT N/A 06/27/2013   Procedure: SUB Q ICD;  Surgeon: Duke Salvia, MD;  Location: [facility] CATH LAB;  Service: Cardiovascular;  Laterality: N/A;   INTRAOPERATIVE ARTERIOGRAM Right 03/31/2016   Procedure: INTRA OPERATIVE ARTERIOGRAM;  Surgeon: Chuck Hint, MD;  Location: [facility] OR;  Service: Vascular;  Laterality: Right;   IR GENERIC HISTORICAL Right 11/30/2015   IR THROMBECTOMY AV FISTULA W/THROMBOLYSIS/PTA INC/SHUNT/IMG RIGHT 11/30/2015 Irish Lack, MD [facility]   IR GENERIC HISTORICAL  11/30/2015   IR US GUIDE VASC ACCESS RIGHT 11/30/2015 Irish Lack, MD [facility]   IR GENERIC HISTORICAL Right 12/15/2015    IR THROMBECTOMY AV FISTULA W/THROMBOLYSIS/PTA/STENT INC/SHUNT/IMG RT 12/15/2015 Oley Balm, MD [facility]   IR GENERIC HISTORICAL  12/15/2015   IR US GUIDE VASC ACCESS RIGHT 12/15/2015 Oley Balm, MD [facility]   IR GENERIC HISTORICAL  12/28/2015   IR FLUORO GUIDE CV LINE RIGHT 12/28/2015 Jolaine Click, MD [facility]   IR GENERIC HISTORICAL  12/28/2015   IR US GUIDE VASC ACCESS RIGHT 12/28/2015 Jolaine Click, MD [facility]   LEFT A ND RIGHT HEART CATH  01/30/2013   DR Jones Broom   LEFT AND RIGHT HEART CATHETERIZATION WITH CORONARY ANGIOGRAM N/A 01/30/2013   Procedure: LEFT AND RIGHT HEART CATHETERIZATION WITH CORONARY ANGIOGRAM;  Surgeon: Dolores Patty, MD;  Location: [facility] CATH LAB;  Service: Cardiovascular;  Laterality: N/A;   PERIPHERAL VASCULAR CATHETERIZATION Right 01/26/2015   Procedure: A/V Fistulagram;  Surgeon: Chuck Hint, MD;  Location: [facility] INVASIVE CV LAB;  Service: Cardiovascular;  Laterality: Right;   reapea urethral surgery for recurrent obstruction  2011   TOTAL KNEE ARTHROPLASTY Right 2007   VEIN HARVEST Right 03/31/2016   Procedure: RIGHT GREATER SAPHENOUS VEIN HARVEST;  Surgeon: Chuck Hint, MD;  Location: [facility] OR;  Service: Vascular;  Laterality: Right;     Home Medications:  Prior to Admission medications   Medication Sig Start Date End Date Taking? Authorizing Provider  acetaminophen (TYLENOL) 325 MG tablet Take 1-2 tablets (325-650 mg total) by mouth every 4 (four) hours as needed for mild pain. 11/29/19   Love, Evlyn Kanner, PA-C  apixaban (ELIQUIS) 5 MG TABS tablet Take 1 tablet (5 mg total) by mouth 2 (two) times daily. 12/21/22   Burchette, Elberta Fortis, MD  atorvastatin (LIPITOR) 80 MG tablet TAKE 1 TABLET BY MOUTH AT BEDTIME Patient taking differently: Take 80 mg by mouth daily. 06/24/22   Burchette, Elberta Fortis, MD  carvedilol (COREG) 12.5 MG tablet Take 1 tablet (12.5 mg total) by mouth 2 (two) times daily. 01/25/23   Burchette, Elberta Fortis, MD   cinacalcet (SENSIPAR) 30 MG tablet Take 1 tablet (30 mg total) by mouth Every Tuesday,Thursday,and Saturday with dialysis. 11/30/19   Love, Evlyn Kanner, PA-C  Continuous Glucose Receiver (FREESTYLE LIBRE 3 READER) DEVI Use to check blood glucose TID 08/11/22   Burchette, Elberta Fortis, MD  Continuous Glucose Sensor (FREESTYLE LIBRE 3 PLUS SENSOR) MISC Change sensor every 15 days. Use as directed to check glucose daily 12/21/22   Burchette, Elberta Fortis, MD  fenofibrate 160 MG tablet TAKE 1 TABLET BY MOUTH ONCE DAILY Patient taking differently: Take 160 mg by mouth daily. 06/24/22   Burchette, Elberta Fortis, MD  gabapentin (NEURONTIN) 300 MG capsule TAKE 2  CAPSULES BY MOUTH TWICE DAILY . Patient taking differently: Take 600 mg by mouth 2 (two) times daily. 06/24/22   Burchette, Elberta Fortis, MD  glucose blood test strip Check 1 time daily. E11.9 One Touch Ultra Blue Test Strips 03/10/14   Burchette, Elberta Fortis, MD  HUMALOG KWIKPEN 200 UNIT/ML KwikPen INJECT A MAXIMUM OF 28 UNITS SUBCUTANEOUSLY TWICE DAILY WITH LUNCH AND SUPPER PER SLIDING SCALE. APPOINTMENT REQUIRED FOR FUTURE REFILLS 11/11/22   Burchette, Elberta Fortis, MD  Insulin Pen Needle (BD PEN NEEDLE NANO U/F) 32G X 4 MM MISC USE 1 PEN NEEDLE SUBCUTANEOUSLY WITH INSULIN 4 TIMES DAILY 12/04/19   Burchette, Elberta Fortis, MD  LANTUS SOLOSTAR 100 UNIT/ML Solostar Pen INJECT 12 UNITS SUBCUTANEOUSLY AT BEDTIME Patient taking differently: Inject 14 Units into the skin at bedtime. 07/15/22   Burchette, Elberta Fortis, MD  midodrine (PROAMATINE) 10 MG tablet Take 10 mg by mouth 3 (three) times a week. EVERY TUESDAY, THURSDAY, AND SATURDAY 08/04/20   [provider]  multivitamin (RENA-VIT) TABS tablet Take 1 tablet by mouth once daily Patient taking differently: Take 1 tablet by mouth daily. 07/06/21   Burchette, Elberta Fortis, MD  Olopatadine HCl 0.2 % SOLN Place 1 drop into both eyes daily as needed (for allergies).     [provider]  pantoprazole (PROTONIX) 20 MG tablet Take 20 mg by  mouth daily. 02/28/23   [provider]  sevelamer (RENAGEL) 800 MG tablet Take 1,600 mg by mouth 3 (three) times daily with meals.    [provider]  VENTOLIN HFA 108 (90 Base) MCG/ACT inhaler Inhale 1-2 puffs into the lungs every 6 (six) hours as needed for wheezing or shortness of breath. 03/20/23   Burchette, Elberta Fortis, MD    Inpatient Medications: Scheduled Meds:  [START ON 04/12/2023] aspirin EC  81 mg Oral Daily   atorvastatin  80 mg Oral QHS   carvedilol  12.5 mg Oral BID   Chlorhexidine Gluconate Cloth  6 each Topical Q0600   cinacalcet  30 mg Oral Q T,Th,Sa-HD   fenofibrate  160 mg Oral Daily   furosemide  40 mg Intravenous Once   gabapentin  600 mg Oral BID   insulin aspart  0-15 Units Subcutaneous Q4H   insulin glargine-yfgn  12 Units Subcutaneous QHS   [START ON 04/12/2023] midodrine  10 mg Oral Once per day on Monday Wednesday Friday   multivitamin  1 tablet Oral Daily   olopatadine  1 drop Both Eyes BID   pantoprazole  40 mg Oral Daily   sevelamer carbonate  1,600 mg Oral TID WC   Continuous Infusions:  heparin 1,200 Units/hr (04/11/23 0842)   PRN Meds: acetaminophen, ALPRAZolam, fentaNYL (SUBLIMAZE) injection, magnesium hydroxide, nitroGLYCERIN, ondansetron (ZOFRAN) IV, traZODone  Allergies:    Allergies  Allergen Reactions   Epoetin Alfa Other (See Comments)    Unknown    Ferumoxytol Other (See Comments)    Unknown    Morphine Sulfate Rash and Other (See Comments)    Itches all over, red spots    Social History:   Social History   Socioeconomic History   Marital status: Married    Spouse name: Not on file   Number of children: 0   Years of education: Not on file   Highest education level: Not on file  Occupational History   Not on file  Tobacco Use   Smoking status: Former    Current packs/day: 0.00    Average packs/day: 2.0 packs/day for 32.0 years (  64.0 ttl pk-yrs)    Types: Cigarettes    Start date: 05/11/1978    Quit date:  05/11/2010    Years since quitting: 12.9   Smokeless tobacco: Never  Vaping Use   Vaping status: Never Used  Substance and Sexual Activity   Alcohol use: No   Drug use: No   Sexual activity: Yes  Other Topics Concern   Not on file  Social History Narrative   Works at [employer] as a Therapist, music      Right handed   Wears glasses    Drinks 10 oz coffee daily   Drinks 2 sodas per day     Family History:   Family History  Problem Relation Age of Onset   Bladder Cancer Mother    Alcohol abuse Father    Melanoma Father    Stroke Maternal Grandmother    Heart Problems Maternal Grandmother        unknown   Diabetes Maternal Grandmother    Heart disease Maternal Grandfather    Prostate cancer Maternal Grandfather      ROS:  Please see the history of present illness.  All other ROS reviewed and negative.     Physical Exam/Data:   Vitals:   04/11/23 0600 04/11/23 0754 04/11/23 0800 04/11/23 0900  BP: (!) 118/33 (!) 124/41 (!) 124/41 (!) 129/39  Pulse: 70 70 71 73  Resp:   (!) 21 18  Temp:      TempSrc:      SpO2: 96%  98% 100%  Weight:      Height:        Intake/Output Summary (Last 24 hours) at 04/11/2023 0957 Last data filed at 04/11/2023 0411 Gross per 24 hour  Intake 160.07 ml  Output --  Net 160.07 ml      04/10/2023    8:26 PM 03/20/2023    2:07 PM 03/07/2023   12:49 PM  Last 3 Weights  Weight (lbs) 207 lb 3.7 oz 206 lb 12.8 oz 186 lb 4.6 oz  Weight (kg) 94 kg 93.804 kg 84.5 kg     Body mass index is 29.73 kg/m.  General:  Laying in bed, in no acute distress wearing [place] O2 HEENT: normal Neck: no JVD Vascular: No carotid bruits; radial pulses 2+ bilaterally Cardiac:  normal S1, S2; RRR; no murmur  Lungs:  clear to auscultation bilaterally, no wheezing, rhonchi or rales  Abd: soft, nontender, no hepatomegaly  Ext: s/p bilateral BKA Musculoskeletal:  No deformities, BUE and BLE strength normal and equal Skin: warm and dry  Neuro:  CNs 2-12 intact, no focal  abnormalities noted Psych:  Normal affect   EKG:  The EKG was personally reviewed and demonstrates:  Initial EKG: Sinus tachycardia, HR 105, LBBB,  widespread ST depression and ST elevation in AVR.  Repeat EKG: NSR, 84, nonspecific T wave changes Telemetry:  Telemetry was personally reviewed and demonstrates:  NSR, HR 70-80's with PVC's  Relevant CV Studies: ECHO pending this admission   ECHO  IMPRESSIONS 12/09/2022  1. Left ventricular ejection fraction, by estimation, is 40%. The left  ventricle has mildly decreased function. The left ventricle demonstrates  global hypokinesis. There is moderate left ventricular hypertrophy. Left  ventricular diastolic parameters are   consistent with Grade I diastolic dysfunction (impaired relaxation).   2. Right ventricular systolic function is normal. The right ventricular  size is normal.   3. Limited echo to evaluate LV function   Comparison(s): EF 30-35%. Moderate concentric LVH. Moderate  mitral  regurgitation.   Nuclear Stress Test 08/25/2022 IMPRESSION: 1. There is a large in size, severe, fixed defect involving the basal inferolateral, mid inferolateral, mid anterolateral and apical lateral segments. This is consistent with infarction involving the left circumflex coronary artery.   2. Globally diminished left ventricular wall motion and left ventricular dilatation.   3. Left ventricular ejection fraction 18%   4. Non invasive risk stratification*: High  Cath 2015 for cardiomyopathy with diffuse diabetic disease - LM (mild ostial tapering), LAD narrow vessel (30% ostial. 40 to 50% tubular lesion in the mid section.  80% lesion distally.  Large diagonal with moderate diffuse disease and 70% and mid section.), Lcx (nondominant vessel with diffuse diabetic vasculopathy.  Large OM-1. Small OM-2 and OM-3.  30% mid AV groove Cx lesion.  40% lesion in proximal OM-1.) and mild plaque in RCA  Laboratory Data:  High Sensitivity Troponin:    Recent Labs  Lab 04/10/23 2210 04/11/23 0049 04/11/23 0505 04/11/23 0800  TROPONINIHS 176* 2,147* 9,544* 15,002*     Chemistry Recent Labs  Lab 04/10/23 2210 04/11/23 0505  NA 130* 129*  K 4.0 3.8  CL 92* 95*  CO2 20* 19*  GLUCOSE 138* 120*  BUN 78* 83*  CREATININE 10.44* 10.82*  CALCIUM 8.7* 8.4*  GFRNONAA 5* 5*  ANIONGAP 18* 15    Recent Labs  Lab 04/10/23 2210  PROT 7.5  ALBUMIN 3.5  AST 37  ALT 25  ALKPHOS 114  BILITOT 0.8   Lipids  Recent Labs  Lab 04/11/23 0505  CHOL 78  TRIG 203*  HDL 30*  LDLCALC 7  CHOLHDL 2.6    Hematology Recent Labs  Lab 04/10/23 2210 04/11/23 0532  WBC 11.5* 10.3  RBC 4.12* 3.60*  HGB 12.2* 10.6*  HCT 36.5* 32.5*  MCV 88.6 90.3  MCH 29.6 29.4  MCHC 33.4 32.6  RDW 14.1 14.0  PLT 218 187   Thyroid No results for input(s): "TSH", "FREET4" in the last 168 hours.  BNPNo results for input(s): "BNP", "PROBNP" in the last 168 hours.  DDimer No results for input(s): "DDIMER" in the last 168 hours.   Radiology/Studies:  DG Chest Portable 1 View Result Date: 04/10/2023 CLINICAL DATA:  Short of breath, COPD EXAM: PORTABLE CHEST 1 VIEW COMPARISON:  03/06/2023 FINDINGS: Single frontal view of the chest demonstrates stable right internal jugular dialysis catheter and single lead defibrillator. Cardiac silhouette is stable. No acute airspace disease, effusion, or pneumothorax. No acute bony abnormalities. IMPRESSION: 1. Stable chest, no acute process. Electronically Signed   By: Sharlet Salina M.D.   On: 04/10/2023 21:52     Assessment and Plan:   NSTEMI  Presented to ED with midsternum CP, burning, 8/10, started at rest around 6 pm on 3/31, associated with SOB. Some relief with O2 from EMS. Similar CP to previous episodes. hsTN 176 >>2147>>9,544 >>15,002. CXR unremarkable.  - Continue on IV heparin to replace Eliquis. Last dose of Eliquis 04/10/23 at 6 p.m. Patient eligible for cath 4/2. Discussed transfer to [place] for  Cath. However, patient strongly suspects dialysis need is the driver. Explained hsTn trend is most likely cardiac related and he understands. After dialysis, treatment, he is willing to undergo transfer and cath.  - Continue on ASA, prn NTG, morphine, IV heparin, Lipitor 80 mg and Coreg 12.5 mg  - Follow telemetry.  - ECHO pending   Paroxysmal atrial fib - Currently on IV heparin to replace Eliquis - Continue Coreg 12.5 - Telemetry:  NSR, HR 70-80's with PVC  Dyslipidemia - 04/2023: LDL 7 - continue Lipitor 80 mg, fenofibrate 160 mg   DM2 - on insulin, managed by primary  - 03/20/2023 A1C: 6.6  HTN  - remains on Coreg, also on midodrine on dialysis days - Wide pulse pressure this admission. BP this am 129/39  Otherwise managed by primary -ESRD on hemodialysis (Tues, Thurs, Sat.) - Diuretics deferred to nephrology -GERD  Risk Assessment/Risk Scores:  { TIMI Risk Score for Unstable Angina or Non-ST Elevation MI:   The patient's TIMI risk score is 5, which indicates a 26% risk of all cause mortality, new or recurrent myocardial infarction or need for urgent revascularization in the next 14 days.{      For questions or updates, please contact [facility] Please consult [email or web address] for contact info under    Signed, Basilio Cairo, PA-C  04/11/2023 9:57 AM    Attending Note   Patient seen and discussed with PA Luan Pulling, I agree with her documentation. 63 yo male history of chronic HFmrEF, ESRD, HTN, diffuse nonobstructive CAD by 2015 cath, HLD, DM2, carotid stenosis, afib, PAD s/p BKA, presents with SOB and chest pain. Reports did miss HD session on Saturday.        K 4 Cr 10.44 BUN 78 WBC 11.5 Hgb 12.2 Plt 218 TC 78 TG 203 HDL 30 LDL 7 EKG SR, chronic LBBB CXR no acute process       1.NSTEMI - diffuse nonobstructive CAD by 2015 cath - Trop 176-->2147-->9544-->15,002 - echo pending - last eliquis dose 3/31 at 6pm.  - currently on hep gtt, ASA 81, atorva  80, coreg 12.5mg  bid - plan for cath tomorrow, patient is in agreement.   Informed Consent   Shared Decision Making/Informed Consent The risks [stroke (1 in 1000), death (1 in 1000), kidney failure [usually temporary] (1 in 500), bleeding (1 in 200), allergic reaction [possibly serious] (1 in 200)], benefits (diagnostic support and management of coronary artery disease) and alternatives of a cardiac catheterization were discussed in detail with Mr. Wearing and he is willing to proceed.           2.Chronic HFmrEF - 08/2019 echo: LVEF 50%, grade I dd - 08/2022 echo: LVEF 30-35%, global hypokinesis, grade II dd, mod MR - 11/2022 echo: LVEF 40%, grade I dd, normal RV function - medical therapy limtied by low bp's particularly with HD - missed HD on Saturday, plans for HD per nephrrology     3. Afib - rate controlled with coreg, on eliquis for stroke prevention - eliquis on hold for possible cath - EKG this admit shows NSR   4. ESRD - per nephrology, did miss HD session on Saturday.   5. HLD - fenofibrate contraindicated in setting of HD, will d/c. If neccesary could consider vascepa - continue statin   Anthony Rich MD

## 2023-04-11 NOTE — ED Notes (Signed)
 [facility] called for transport.

## 2023-04-11 NOTE — Progress Notes (Signed)
 PHARMACY - ANTICOAGULATION CONSULT NOTE  Pharmacy Consult for heparin Indication: chest pain/ACS  Allergies  Allergen Reactions   Epoetin Alfa Other (See Comments)    Unknown    Ferumoxytol Other (See Comments)    Unknown    Morphine Sulfate Rash and Other (See Comments)    Itches all over, red spots    Patient Measurements: Height: 5\' 10"  (177.8 cm) Weight: 94 kg (207 lb 3.7 oz) IBW/kg (Calculated) : 73 HEPARIN DW (KG): 92.1  Vital Signs: Temp: 98.1 F (36.7 C) (03/31 2330) Temp Source: Oral (03/31 2330) BP: 120/65 (03/31 2330) Pulse Rate: 87 (03/31 2330)  Labs: Recent Labs    04/10/23 2210 04/11/23 0049  HGB 12.2*  --   HCT 36.5*  --   PLT 218  --   CREATININE 10.44*  --   TROPONINIHS 176* 2,147*    Estimated Creatinine Clearance: 8.6 mL/min (A) (by C-G formula based on SCr of 10.44 mg/dL (H)).  Medications:  -Eliquis 5mg  PO BID   Assessment: 62 yoM presented to [facility] with shortness of breath. PMH includes: CHF, COPD, diabetes, ESRD, HLD, HTN, bilateral BKA, and paroxysmal afib (PTA eliquis). Pharmacy consulted to dose heparin for ACS given continued elevated troponins.  -Last dose of eliquis: 3/31 ~2000 (will utilize aPTTs for monitoring) -Hgb 12, plts 218 -Trops 176 > 2,147   Goal of Therapy:  Heparin level 0.3-0.7 units/ml aPTT 66-102 seconds Monitor platelets by anticoagulation protocol: Yes   Plan:  -No bolus given recent eliquis administration  -Start heparin infusion at 1200 units/hr at 0800 (~12h after last eliquis dose) -8h aPTT and continue using until correlates with heparin level -CBC, heparin level daily  Arabella Merles, PharmD. Clinical Pharmacist 04/11/2023 2:00 AM

## 2023-04-11 NOTE — Progress Notes (Signed)
 PHARMACY - ANTICOAGULATION CONSULT NOTE  Pharmacy Consult for heparin Indication:  NSTEMI/Afib  Labs: Recent Labs    04/10/23 2210 04/11/23 0049 04/11/23 0505 04/11/23 0532 04/11/23 0800 04/11/23 0933 04/11/23 2138  HGB 12.2*  --   --  10.6*  --   --   --   HCT 36.5*  --   --  32.5*  --   --   --   PLT 218  --   --  187  --   --   --   APTT  --   --   --   --   --   --  120*  LABPROT  --   --  17.3*  --   --   --   --   INR  --   --  1.4*  --   --   --   --   CREATININE 10.44*  --  10.82*  --   --   --   --   TROPONINIHS 176*   < > 9,544*  --  15,002* 16,311*  --    < > = values in this interval not displayed.   Assessment: 63yo male supratherapeutic on heparin with initial dosing while DOAC on hold though pt has limited access requiring lab draws from same UE as UFH infusion which can affect results; no infusion issues or signs of bleeding per RN.  Goal of Therapy:  aPTT 66-102 seconds   Plan:  Decrease heparin infusion slightly to 1100 units/hr. Check PTT in 8 hours.   Vernard Gambles, PharmD, BCPS 04/11/2023 10:55 PM

## 2023-04-11 NOTE — ED Notes (Signed)
 Pt placement is req for staff to let them know when this pt has had dialysis so they can get this pt a bed

## 2023-04-11 NOTE — Assessment & Plan Note (Signed)
 -  We will continue PPI therapy

## 2023-04-11 NOTE — ED Notes (Signed)
 Patient to dialysis at this time, room 227. On portable monitor, transfer via stretcher with NT, Tori.

## 2023-04-11 NOTE — Assessment & Plan Note (Signed)
-   The patient will be placed on IV heparin as mentioned above to replace Eliquis. - We will continue beta-blocker therapy.

## 2023-04-11 NOTE — H&P (View-Only) (Signed)
 Cardiology Consultation   Patient ID: [name] MRN: [id number]; DOB: 05/31/1960  Admit date: 04/10/2023 Date of Consult: 04/11/2023  PCP:  Kristian Covey, MD   [facility] Providers Cardiologist:  Dina Rich, MD  Electrophysiologist:  Sherryl Manges, MD  {  Patient Profile:   Anthony Bullock is a 62 y.o. male with a hx of chronic HFrEF/NICM s/p Bos Sci S-ICD 06/2013 subsequently left in place after battery ran out, mostly nonobstructive CAD by cath 2015 (diffuse diabetic vasculopathy managed medically), ESRD on HD, HTN then issues with hypotension, HLD, DM2, mild carotid disease (1-39% BICA 12/2022), LBBB, moderate MR by echo 08/2022, osteomyelitis, bilateral BKA, prior gangrenous wound, anemia, arthritis, kidney stones who is being seen 04/11/2023 for the evaluation of chest pain at the request of Dr. Laural Benes.  History of Present Illness:   Anthony Bullock has had multiple recurrent admissions for issues with respiratory failure, volume overload, PND, previous COVID-19, complicated at times by AF RVR requiring IV amiodarone and mildly elevated troponins.   Patient was last seen during hospitalization on 03/08/2023 for chest pain and shortness of breath.  Troponins not consistent with ACS and reflected demand ischemia.  He responded well to hemodialysis.  Decision to hold off on repeat echo at that time and continue baseline outpatient regimen.  Presented to ED 04/10/23 for CP and SOB. ED exam showed no volume overload with clear lungs. V/S notable for 162/51 with HR of 105. Initial labs: CBC with decreased RBC 4.12, Hgb 12.2, and HCT 36.5, which is chronic and normal. Also elevated CBC diff includes neut 8.6 and Mono 1.2. PT 17.3 and INR 1.4. hsTN 176 >>2147>>9,544 CXR unremarkable.  Initial EKG: Sinus tachycardia, HR 105, LBBB,  widespread ST depression and ST elevation in AVR.  Repeat EKG: NSR, 84, nonspecific T wave changes  Cardiology consulted for chest pain.  On  interview patient reports CP and SOB started around 6 PM yesterday while at rest, some relief from O2 with EMS.  Also noted worse with laying down, however this is a chronic issue. CP described as burning 8/10, located midsternum, no radiation.  Prior to 6 PM yesterday, patient he did more exertional activity than normal without any issues during that time. Denies any diaphoresis, nausea, and vomiting.  Reports this CP as similar to previous episodes. Denies any SOB outside of CP episodes. Prior to arrival, patient reports being 1.7 kg above his dry weight.  He believes this episode is more related to dialysis since in the past similar episodes arise when waiting from Saturday to Tuesday for dialysis. Patient suspect underlying need for dialysis is driver, would prefer dialysis treatment and then would be willing to undergo cath. Currently, no CP or SOB at this time. Last took Eliquis at 6 pm on 04/10/23. His last dialysis was on Saturday, which he reports getting extra fluids taken off and endorses dialysis compliance.  He stopped smoking 14 years ago, approximately 2 PPD, denies any EtOH or drugs.  Endorses low-sodium diet, < 2000 g Na and only 32 oz of fluids daily.   Past Medical History:  Diagnosis Date   AICD (automatic cardioverter/defibrillator) present    [employer]   Allergic rhinitis    Anemia    Arthritis    Chronic systolic heart failure (HCC)    a. ECHO (12/2012) EF 25-30%, HK entireanteroseptal myocardium //  b.  EF 25%, diffuse HK, grade 1 diastolic dysfunction, MAC, mild LAE, normal RVSF, trivial pericardial effusion  COPD (chronic obstructive pulmonary disease) (HCC)    Diabetes mellitus type II    Diabetic nephropathy (HCC)    Diabetic neuropathy (HCC)    ESRD on hemodialysis [facility])    started HD June 2017, goes to [facility] HD unit, Dr Fausto Skillern   History of cardiac catheterization    a.Myoview 1/15:  There is significant left ventricular dysfunction. There may be slight  scar at the apex. There is no significant ischemia. LV Ejection Fraction: 27%  //  b. RHC/LHC (1/15) with mean RA 6, PA 47/22 mean 33, mean PCWP 20, PVR 2.5 WU, CI 2.5; 80% dLAD stenosis, 70% diffuse large D.     History of kidney stones    Hyperlipidemia    Hypertension    Kidney stones    NICM (nonischemic cardiomyopathy) (HCC)    Primarily nonischemic.  Echo (12/14) with EF 25-30%.  Echo (3/15) with EF 25%, mild to moderately dilated LV, normal RV size and systolic function.     Osteomyelitis (HCC)    left fifth ray   Pneumonia    Urethral stricture    Wears glasses     Past Surgical History:  Procedure Laterality Date   ABDOMINAL AORTOGRAM W/LOWER EXTREMITY N/A 03/30/2016   Procedure: Abdominal Aortogram w/Lower Extremity;  Surgeon: Chuck Hint, MD;  Location: [facility] INVASIVE CV LAB;  Service: Cardiovascular;  Laterality: N/A;   AMPUTATION Right 04/26/2016   Procedure: Right Below Knee Amputation;  Surgeon: Nadara Mustard, MD;  Location: [facility] OR;  Service: Orthopedics;  Laterality: Right;   AMPUTATION Left 08/21/2019   Procedure: LEFT FOOT 5TH RAY AMPUTATION;  Surgeon: Nadara Mustard, MD;  Location: [facility] OR;  Service: Orthopedics;  Laterality: Left;   AMPUTATION Left 11/13/2019   Procedure: LEFT BELOW KNEE AMPUTATION;  Surgeon: Nadara Mustard, MD;  Location: [facility] OR;  Service: Orthopedics;  Laterality: Left;   AV FISTULA PLACEMENT Right 09/08/2015   Procedure: INSERTION OF 4-37mm x 45cm  ARTERIOVENOUS (AV) GORE-TEX GRAFT RIGHT UPPER  ARM;  Surgeon: Chuck Hint, MD;  Location: [facility] OR;  Service: Vascular;  Laterality: Right;   AV FISTULA PLACEMENT Left 01/14/2016   Procedure: CREATION OF LEFT UPPER ARM ARTERIOVENOUS FISTULA;  Surgeon: Chuck Hint, MD;  Location: [facility] OR;  Service: Vascular;  Laterality: Left;   BASCILIC VEIN TRANSPOSITION Right 08/22/2014   Procedure: RIGHT UPPER ARM BASCILIC VEIN TRANSPOSITION;  Surgeon: Chuck Hint, MD;  Location: [facility] OR;  Service:  Vascular;  Laterality: Right;   BELOW KNEE LEG AMPUTATION Right 04/26/2016   CARDIAC CATHETERIZATION     CARDIAC DEFIBRILLATOR PLACEMENT  06/27/2013   Sub Q       BY DR Graciela Husbands   CATARACT EXTRACTION W/PHACO Right 08/06/2018   Procedure: CATARACT EXTRACTION PHACO AND INTRAOCULAR LENS PLACEMENT (IOC);  Surgeon: Fabio Pierce, MD;  Location: [facility] ORS;  Service: Ophthalmology;  Laterality: Right;  CDE: 4.06   CATARACT EXTRACTION W/PHACO Left 08/20/2018   Procedure: CATARACT EXTRACTION PHACO AND INTRAOCULAR LENS PLACEMENT (IOC);  Surgeon: Fabio Pierce, MD;  Location: [facility] ORS;  Service: Ophthalmology;  Laterality: Left;  CDE: 6.76   COLONOSCOPY WITH PROPOFOL N/A 07/22/2015   Procedure: COLONOSCOPY WITH PROPOFOL;  Surgeon: Sherrilyn Rist, MD;  Location: [facility] ENDOSCOPY;  Service: Gastroenterology;  Laterality: N/A;   FEMORAL-POPLITEAL BYPASS GRAFT Right 03/31/2016   Procedure: BYPASS GRAFT FEMORAL-POPLITEAL ARTERY USING RIGHT GREATER SAPHENOUS NONREVERSED VEIN;  Surgeon: Chuck Hint, MD;  Location: [facility] OR;  Service: Vascular;  Laterality: Right;  HERNIA REPAIR     I & D EXTREMITY Right 03/31/2016   Procedure: IRRIGATION AND DEBRIDEMENT FOOT;  Surgeon: Chuck Hint, MD;  Location: [facility] OR;  Service: Vascular;  Laterality: Right;   IMPLANTABLE CARDIOVERTER DEFIBRILLATOR IMPLANT N/A 06/27/2013   Procedure: SUB Q ICD;  Surgeon: Duke Salvia, MD;  Location: [facility] CATH LAB;  Service: Cardiovascular;  Laterality: N/A;   INTRAOPERATIVE ARTERIOGRAM Right 03/31/2016   Procedure: INTRA OPERATIVE ARTERIOGRAM;  Surgeon: Chuck Hint, MD;  Location: [facility] OR;  Service: Vascular;  Laterality: Right;   IR GENERIC HISTORICAL Right 11/30/2015   IR THROMBECTOMY AV FISTULA W/THROMBOLYSIS/PTA INC/SHUNT/IMG RIGHT 11/30/2015 Irish Lack, MD [facility]   IR GENERIC HISTORICAL  11/30/2015   IR US GUIDE VASC ACCESS RIGHT 11/30/2015 Irish Lack, MD [facility]   IR GENERIC HISTORICAL Right 12/15/2015    IR THROMBECTOMY AV FISTULA W/THROMBOLYSIS/PTA/STENT INC/SHUNT/IMG RT 12/15/2015 Oley Balm, MD [facility]   IR GENERIC HISTORICAL  12/15/2015   IR US GUIDE VASC ACCESS RIGHT 12/15/2015 Oley Balm, MD [facility]   IR GENERIC HISTORICAL  12/28/2015   IR FLUORO GUIDE CV LINE RIGHT 12/28/2015 Jolaine Click, MD [facility]   IR GENERIC HISTORICAL  12/28/2015   IR US GUIDE VASC ACCESS RIGHT 12/28/2015 Jolaine Click, MD [facility]   LEFT A ND RIGHT HEART CATH  01/30/2013   DR Jones Broom   LEFT AND RIGHT HEART CATHETERIZATION WITH CORONARY ANGIOGRAM N/A 01/30/2013   Procedure: LEFT AND RIGHT HEART CATHETERIZATION WITH CORONARY ANGIOGRAM;  Surgeon: Dolores Patty, MD;  Location: [facility] CATH LAB;  Service: Cardiovascular;  Laterality: N/A;   PERIPHERAL VASCULAR CATHETERIZATION Right 01/26/2015   Procedure: A/V Fistulagram;  Surgeon: Chuck Hint, MD;  Location: [facility] INVASIVE CV LAB;  Service: Cardiovascular;  Laterality: Right;   reapea urethral surgery for recurrent obstruction  2011   TOTAL KNEE ARTHROPLASTY Right 2007   VEIN HARVEST Right 03/31/2016   Procedure: RIGHT GREATER SAPHENOUS VEIN HARVEST;  Surgeon: Chuck Hint, MD;  Location: [facility] OR;  Service: Vascular;  Laterality: Right;     Home Medications:  Prior to Admission medications   Medication Sig Start Date End Date Taking? Authorizing Provider  acetaminophen (TYLENOL) 325 MG tablet Take 1-2 tablets (325-650 mg total) by mouth every 4 (four) hours as needed for mild pain. 11/29/19   Love, Evlyn Kanner, PA-C  apixaban (ELIQUIS) 5 MG TABS tablet Take 1 tablet (5 mg total) by mouth 2 (two) times daily. 12/21/22   Burchette, Elberta Fortis, MD  atorvastatin (LIPITOR) 80 MG tablet TAKE 1 TABLET BY MOUTH AT BEDTIME Patient taking differently: Take 80 mg by mouth daily. 06/24/22   Burchette, Elberta Fortis, MD  carvedilol (COREG) 12.5 MG tablet Take 1 tablet (12.5 mg total) by mouth 2 (two) times daily. 01/25/23   Burchette, Elberta Fortis, MD   cinacalcet (SENSIPAR) 30 MG tablet Take 1 tablet (30 mg total) by mouth Every Tuesday,Thursday,and Saturday with dialysis. 11/30/19   Love, Evlyn Kanner, PA-C  Continuous Glucose Receiver (FREESTYLE LIBRE 3 READER) DEVI Use to check blood glucose TID 08/11/22   Burchette, Elberta Fortis, MD  Continuous Glucose Sensor (FREESTYLE LIBRE 3 PLUS SENSOR) MISC Change sensor every 15 days. Use as directed to check glucose daily 12/21/22   Burchette, Elberta Fortis, MD  fenofibrate 160 MG tablet TAKE 1 TABLET BY MOUTH ONCE DAILY Patient taking differently: Take 160 mg by mouth daily. 06/24/22   Burchette, Elberta Fortis, MD  gabapentin (NEURONTIN) 300 MG capsule TAKE 2  CAPSULES BY MOUTH TWICE DAILY . Patient taking differently: Take 600 mg by mouth 2 (two) times daily. 06/24/22   Burchette, Elberta Fortis, MD  glucose blood test strip Check 1 time daily. E11.9 One Touch Ultra Blue Test Strips 03/10/14   Burchette, Elberta Fortis, MD  HUMALOG KWIKPEN 200 UNIT/ML KwikPen INJECT A MAXIMUM OF 28 UNITS SUBCUTANEOUSLY TWICE DAILY WITH LUNCH AND SUPPER PER SLIDING SCALE. APPOINTMENT REQUIRED FOR FUTURE REFILLS 11/11/22   Burchette, Elberta Fortis, MD  Insulin Pen Needle (BD PEN NEEDLE NANO U/F) 32G X 4 MM MISC USE 1 PEN NEEDLE SUBCUTANEOUSLY WITH INSULIN 4 TIMES DAILY 12/04/19   Burchette, Elberta Fortis, MD  LANTUS SOLOSTAR 100 UNIT/ML Solostar Pen INJECT 12 UNITS SUBCUTANEOUSLY AT BEDTIME Patient taking differently: Inject 14 Units into the skin at bedtime. 07/15/22   Burchette, Elberta Fortis, MD  midodrine (PROAMATINE) 10 MG tablet Take 10 mg by mouth 3 (three) times a week. EVERY TUESDAY, THURSDAY, AND SATURDAY 08/04/20   [provider]  multivitamin (RENA-VIT) TABS tablet Take 1 tablet by mouth once daily Patient taking differently: Take 1 tablet by mouth daily. 07/06/21   Burchette, Elberta Fortis, MD  Olopatadine HCl 0.2 % SOLN Place 1 drop into both eyes daily as needed (for allergies).     [provider]  pantoprazole (PROTONIX) 20 MG tablet Take 20 mg by  mouth daily. 02/28/23   [provider]  sevelamer (RENAGEL) 800 MG tablet Take 1,600 mg by mouth 3 (three) times daily with meals.    [provider]  VENTOLIN HFA 108 (90 Base) MCG/ACT inhaler Inhale 1-2 puffs into the lungs every 6 (six) hours as needed for wheezing or shortness of breath. 03/20/23   Burchette, Elberta Fortis, MD    Inpatient Medications: Scheduled Meds:  [START ON 04/12/2023] aspirin EC  81 mg Oral Daily   atorvastatin  80 mg Oral QHS   carvedilol  12.5 mg Oral BID   Chlorhexidine Gluconate Cloth  6 each Topical Q0600   cinacalcet  30 mg Oral Q T,Th,Sa-HD   fenofibrate  160 mg Oral Daily   furosemide  40 mg Intravenous Once   gabapentin  600 mg Oral BID   insulin aspart  0-15 Units Subcutaneous Q4H   insulin glargine-yfgn  12 Units Subcutaneous QHS   [START ON 04/12/2023] midodrine  10 mg Oral Once per day on Monday Wednesday Friday   multivitamin  1 tablet Oral Daily   olopatadine  1 drop Both Eyes BID   pantoprazole  40 mg Oral Daily   sevelamer carbonate  1,600 mg Oral TID WC   Continuous Infusions:  heparin 1,200 Units/hr (04/11/23 0842)   PRN Meds: acetaminophen, ALPRAZolam, fentaNYL (SUBLIMAZE) injection, magnesium hydroxide, nitroGLYCERIN, ondansetron (ZOFRAN) IV, traZODone  Allergies:    Allergies  Allergen Reactions   Epoetin Alfa Other (See Comments)    Unknown    Ferumoxytol Other (See Comments)    Unknown    Morphine Sulfate Rash and Other (See Comments)    Itches all over, red spots    Social History:   Social History   Socioeconomic History   Marital status: Married    Spouse name: Not on file   Number of children: 0   Years of education: Not on file   Highest education level: Not on file  Occupational History   Not on file  Tobacco Use   Smoking status: Former    Current packs/day: 0.00    Average packs/day: 2.0 packs/day for 32.0 years (  64.0 ttl pk-yrs)    Types: Cigarettes    Start date: 05/11/1978    Quit date:  05/11/2010    Years since quitting: 12.9   Smokeless tobacco: Never  Vaping Use   Vaping status: Never Used  Substance and Sexual Activity   Alcohol use: No   Drug use: No   Sexual activity: Yes  Other Topics Concern   Not on file  Social History Narrative   Works at [employer] as a Therapist, music      Right handed   Wears glasses    Drinks 10 oz coffee daily   Drinks 2 sodas per day     Family History:   Family History  Problem Relation Age of Onset   Bladder Cancer Mother    Alcohol abuse Father    Melanoma Father    Stroke Maternal Grandmother    Heart Problems Maternal Grandmother        unknown   Diabetes Maternal Grandmother    Heart disease Maternal Grandfather    Prostate cancer Maternal Grandfather      ROS:  Please see the history of present illness.  All other ROS reviewed and negative.     Physical Exam/Data:   Vitals:   04/11/23 0600 04/11/23 0754 04/11/23 0800 04/11/23 0900  BP: (!) 118/33 (!) 124/41 (!) 124/41 (!) 129/39  Pulse: 70 70 71 73  Resp:   (!) 21 18  Temp:      TempSrc:      SpO2: 96%  98% 100%  Weight:      Height:        Intake/Output Summary (Last 24 hours) at 04/11/2023 0957 Last data filed at 04/11/2023 0411 Gross per 24 hour  Intake 160.07 ml  Output --  Net 160.07 ml      04/10/2023    8:26 PM 03/20/2023    2:07 PM 03/07/2023   12:49 PM  Last 3 Weights  Weight (lbs) 207 lb 3.7 oz 206 lb 12.8 oz 186 lb 4.6 oz  Weight (kg) 94 kg 93.804 kg 84.5 kg     Body mass index is 29.73 kg/m.  General:  Laying in bed, in no acute distress Bullock [place] O2 HEENT: normal Neck: no JVD Vascular: No carotid bruits; radial pulses 2+ bilaterally Cardiac:  normal S1, S2; RRR; no murmur  Lungs:  clear to auscultation bilaterally, no wheezing, rhonchi or rales  Abd: soft, nontender, no hepatomegaly  Ext: s/p bilateral BKA Musculoskeletal:  No deformities, BUE and BLE strength normal and equal Skin: warm and dry  Neuro:  CNs 2-12 intact, no focal  abnormalities noted Psych:  Normal affect   EKG:  The EKG was personally reviewed and demonstrates:  Initial EKG: Sinus tachycardia, HR 105, LBBB,  widespread ST depression and ST elevation in AVR.  Repeat EKG: NSR, 84, nonspecific T wave changes Telemetry:  Telemetry was personally reviewed and demonstrates:  NSR, HR 70-80's with PVC's  Relevant CV Studies: ECHO pending this admission   ECHO  IMPRESSIONS 12/09/2022  1. Left ventricular ejection fraction, by estimation, is 40%. The left  ventricle has mildly decreased function. The left ventricle demonstrates  global hypokinesis. There is moderate left ventricular hypertrophy. Left  ventricular diastolic parameters are   consistent with Grade I diastolic dysfunction (impaired relaxation).   2. Right ventricular systolic function is normal. The right ventricular  size is normal.   3. Limited echo to evaluate LV function   Comparison(s): EF 30-35%. Moderate concentric LVH. Moderate  mitral  regurgitation.   Nuclear Stress Test 08/25/2022 IMPRESSION: 1. There is a large in size, severe, fixed defect involving the basal inferolateral, mid inferolateral, mid anterolateral and apical lateral segments. This is consistent with infarction involving the left circumflex coronary artery.   2. Globally diminished left ventricular wall motion and left ventricular dilatation.   3. Left ventricular ejection fraction 18%   4. Non invasive risk stratification*: High  Cath 2015 for cardiomyopathy with diffuse diabetic disease - LM (mild ostial tapering), LAD narrow vessel (30% ostial. 40 to 50% tubular lesion in the mid section.  80% lesion distally.  Large diagonal with moderate diffuse disease and 70% and mid section.), Lcx (nondominant vessel with diffuse diabetic vasculopathy.  Large OM-1. Small OM-2 and OM-3.  30% mid AV groove Cx lesion.  40% lesion in proximal OM-1.) and mild plaque in RCA  Laboratory Data:  High Sensitivity Troponin:    Recent Labs  Lab 04/10/23 2210 04/11/23 0049 04/11/23 0505 04/11/23 0800  TROPONINIHS 176* 2,147* 9,544* 15,002*     Chemistry Recent Labs  Lab 04/10/23 2210 04/11/23 0505  NA 130* 129*  K 4.0 3.8  CL 92* 95*  CO2 20* 19*  GLUCOSE 138* 120*  BUN 78* 83*  CREATININE 10.44* 10.82*  CALCIUM 8.7* 8.4*  GFRNONAA 5* 5*  ANIONGAP 18* 15    Recent Labs  Lab 04/10/23 2210  PROT 7.5  ALBUMIN 3.5  AST 37  ALT 25  ALKPHOS 114  BILITOT 0.8   Lipids  Recent Labs  Lab 04/11/23 0505  CHOL 78  TRIG 203*  HDL 30*  LDLCALC 7  CHOLHDL 2.6    Hematology Recent Labs  Lab 04/10/23 2210 04/11/23 0532  WBC 11.5* 10.3  RBC 4.12* 3.60*  HGB 12.2* 10.6*  HCT 36.5* 32.5*  MCV 88.6 90.3  MCH 29.6 29.4  MCHC 33.4 32.6  RDW 14.1 14.0  PLT 218 187   Thyroid No results for input(s): "TSH", "FREET4" in the last 168 hours.  BNPNo results for input(s): "BNP", "PROBNP" in the last 168 hours.  DDimer No results for input(s): "DDIMER" in the last 168 hours.   Radiology/Studies:  DG Chest Portable 1 View Result Date: 04/10/2023 CLINICAL DATA:  Short of breath, COPD EXAM: PORTABLE CHEST 1 VIEW COMPARISON:  03/06/2023 FINDINGS: Single frontal view of the chest demonstrates stable right internal jugular dialysis catheter and single lead defibrillator. Cardiac silhouette is stable. No acute airspace disease, effusion, or pneumothorax. No acute bony abnormalities. IMPRESSION: 1. Stable chest, no acute process. Electronically Signed   By: Sharlet Salina M.D.   On: 04/10/2023 21:52     Assessment and Plan:   NSTEMI  Presented to ED with midsternum CP, burning, 8/10, started at rest around 6 pm on 3/31, associated with SOB. Some relief with O2 from EMS. Similar CP to previous episodes. hsTN 176 >>2147>>9,544 >>15,002. CXR unremarkable.  - Continue on IV heparin to replace Eliquis. Last dose of Eliquis 04/10/23 at 6 p.m. Patient eligible for cath 4/2. Discussed transfer to [place] for  Cath. However, patient strongly suspects dialysis need is the driver. Explained hsTn trend is most likely cardiac related and he understands. After dialysis, treatment, he is willing to undergo transfer and cath.  - Continue on ASA, prn NTG, morphine, IV heparin, Lipitor 80 mg and Coreg 12.5 mg  - Follow telemetry.  - ECHO pending   Paroxysmal atrial fib - Currently on IV heparin to replace Eliquis - Continue Coreg 12.5 - Telemetry:  NSR, HR 70-80's with PVC  Dyslipidemia - 04/2023: LDL 7 - continue Lipitor 80 mg, fenofibrate 160 mg   DM2 - on insulin, managed by primary  - 03/20/2023 A1C: 6.6  HTN  - remains on Coreg, also on midodrine on dialysis days - Wide pulse pressure this admission. BP this am 129/39  Otherwise managed by primary -ESRD on hemodialysis (Tues, Thurs, Sat.) - Diuretics deferred to nephrology -GERD  Risk Assessment/Risk Scores:  { TIMI Risk Score for Unstable Angina or Non-ST Elevation MI:   The patient's TIMI risk score is 5, which indicates a 26% risk of all cause mortality, new or recurrent myocardial infarction or need for urgent revascularization in the next 14 days.{      For questions or updates, please contact [facility] Please consult [email or web address] for contact info under    Signed, Basilio Cairo, PA-C  04/11/2023 9:57 AM    Attending Note   Patient seen and discussed with PA Luan Pulling, I agree with her documentation. 63 yo male history of chronic HFmrEF, ESRD, HTN, diffuse nonobstructive CAD by 2015 cath, HLD, DM2, carotid stenosis, afib, PAD s/p BKA, presents with SOB and chest pain. Reports did miss HD session on Saturday.        K 4 Cr 10.44 BUN 78 WBC 11.5 Hgb 12.2 Plt 218 TC 78 TG 203 HDL 30 LDL 7 EKG SR, chronic LBBB CXR no acute process       1.NSTEMI - diffuse nonobstructive CAD by 2015 cath - Trop 176-->2147-->9544-->15,002 - echo pending - last eliquis dose 3/31 at 6pm.  - currently on hep gtt, ASA 81, atorva  80, coreg 12.5mg  bid - plan for cath tomorrow, patient is in agreement.   Informed Consent   Shared Decision Making/Informed Consent The risks [stroke (1 in 1000), death (1 in 1000), kidney failure [usually temporary] (1 in 500), bleeding (1 in 200), allergic reaction [possibly serious] (1 in 200)], benefits (diagnostic support and management of coronary artery disease) and alternatives of a cardiac catheterization were discussed in detail with Anthony Bullock and he is willing to proceed.           2.Chronic HFmrEF - 08/2019 echo: LVEF 50%, grade I dd - 08/2022 echo: LVEF 30-35%, global hypokinesis, grade II dd, mod MR - 11/2022 echo: LVEF 40%, grade I dd, normal RV function - medical therapy limtied by low bp's particularly with HD - missed HD on Saturday, plans for HD per nephrrology     3. Afib - rate controlled with coreg, on eliquis for stroke prevention - eliquis on hold for possible cath - EKG this admit shows NSR   4. ESRD - per nephrology, did miss HD session on Saturday.   5. HLD - fenofibrate contraindicated in setting of HD, will d/c. If neccesary could consider vascepa - continue statin   Dina Rich MD

## 2023-04-11 NOTE — ED Notes (Signed)
 Provider at bedside

## 2023-04-11 NOTE — ED Notes (Signed)
 Transport has not been called due to patient in dialysis.

## 2023-04-11 NOTE — Progress Notes (Signed)
*  PRELIMINARY RESULTS* Echocardiogram 2D Echocardiogram has been performed with Def[facility].  Stacey Drain 04/11/2023, 12:40 PM

## 2023-04-11 NOTE — H&P (Signed)
 [place]   PATIENT NAME: Anthony Bullock    MR#:  [id number]  DATE OF BIRTH:  January 29, 1960  DATE OF ADMISSION:  04/10/2023  PRIMARY CARE PHYSICIAN: Kristian Covey, MD   Patient is coming from: Home  REQUESTING/REFERRING PHYSICIAN: Kennis Carina, MD  CHIEF COMPLAINT:   Chief Complaint  Patient presents with   Shortness of Breath    HISTORY OF PRESENT ILLNESS:  Anthony Bullock is a 63 y.o. male with medical history significant for osteoarthritis, chronic systolic CHF, type 2 diabetes mellitus with diabetic neuropathy, end-stage renal disease on hemodialysis on TTS, hypertension, PVD, s/p bilateral BKA, dyslipidemia, urolithiasis and nonischemic cardiomyopathy, who presented to the emergency room with acute onset of dyspnea and a feeling of fluid overload as if he needs dialysis.  He missed dialysis on Saturday and is due for HD today.  He later experienced midsternal chest pain felt as pressure.  No significant wheezing or cough.  No fever or chills.  No nausea or vomiting or abdominal pain.  No residual cervical muscle weakness.  ED Course: When the patient came to the ER, BP was 162/51 with heart rate of 105 and otherwise normal vital signs.  Labs revealed hyponatremia 130 and hypochloremia of 92 with a CO2 of 20 and glucose of 138, BUN of 78 and creatinine 10.44 and anion gap of 18 with otherwise unremarkable CMP.  High-sensitivity troponin I was 176 and later 2147.  CBC showed mild leukocytosis of 11.5.  Hemoglobin was 12 and hematocrit 26.5 above previous levels. EKG as reviewed by me :  EKG showed sinus tachycardia with rate 105 with left bundle branch block. Repeat EKG showed sinus rhythm with rate of 84 with nonspecific IVCD with LAD and T wave inversion laterally. Imaging: Portable chest x-ray showed no acute cardiopulmonary disease.  The patient will be admitted to a [facility] progressive unit bed for further evaluation and management. PAST MEDICAL HISTORY:   Past Medical  History:  Diagnosis Date   AICD (automatic cardioverter/defibrillator) present    [employer]   Allergic rhinitis    Anemia    Arthritis    Chronic systolic heart failure (HCC)    a. ECHO (12/2012) EF 25-30%, HK entireanteroseptal myocardium //  b.  EF 25%, diffuse HK, grade 1 diastolic dysfunction, MAC, mild LAE, normal RVSF, trivial pericardial effusion   COPD (chronic obstructive pulmonary disease) (HCC)    Diabetes mellitus type II    Diabetic nephropathy (HCC)    Diabetic neuropathy (HCC)    ESRD on hemodialysis (HCC)    started HD June 2017, goes to [facility] HD unit, Dr Fausto Skillern   History of cardiac catheterization    a.Myoview 1/15:  There is significant left ventricular dysfunction. There may be slight scar at the apex. There is no significant ischemia. LV Ejection Fraction: 27%  //  b. RHC/LHC (1/15) with mean RA 6, PA 47/22 mean 33, mean PCWP 20, PVR 2.5 WU, CI 2.5; 80% dLAD stenosis, 70% diffuse large D.     History of kidney stones    Hyperlipidemia    Hypertension    Kidney stones    NICM (nonischemic cardiomyopathy) (HCC)    Primarily nonischemic.  Echo (12/14) with EF 25-30%.  Echo (3/15) with EF 25%, mild to moderately dilated LV, normal RV size and systolic function.     Osteomyelitis (HCC)    left fifth ray   Pneumonia    Urethral stricture    Wears glasses  PAST SURGICAL HISTORY:   Past Surgical History:  Procedure Laterality Date   ABDOMINAL AORTOGRAM W/LOWER EXTREMITY N/A 03/30/2016   Procedure: Abdominal Aortogram w/Lower Extremity;  Surgeon: Chuck Hint, MD;  Location: [facility] INVASIVE CV LAB;  Service: Cardiovascular;  Laterality: N/A;   AMPUTATION Right 04/26/2016   Procedure: Right Below Knee Amputation;  Surgeon: Nadara Mustard, MD;  Location: [facility] OR;  Service: Orthopedics;  Laterality: Right;   AMPUTATION Left 08/21/2019   Procedure: LEFT FOOT 5TH RAY AMPUTATION;  Surgeon: Nadara Mustard, MD;  Location: [facility] OR;  Service: Orthopedics;   Laterality: Left;   AMPUTATION Left 11/13/2019   Procedure: LEFT BELOW KNEE AMPUTATION;  Surgeon: Nadara Mustard, MD;  Location: [facility] OR;  Service: Orthopedics;  Laterality: Left;   AV FISTULA PLACEMENT Right 09/08/2015   Procedure: INSERTION OF 4-36mm x 45cm  ARTERIOVENOUS (AV) GORE-TEX GRAFT RIGHT UPPER  ARM;  Surgeon: Chuck Hint, MD;  Location: [facility] OR;  Service: Vascular;  Laterality: Right;   AV FISTULA PLACEMENT Left 01/14/2016   Procedure: CREATION OF LEFT UPPER ARM ARTERIOVENOUS FISTULA;  Surgeon: Chuck Hint, MD;  Location: [facility] OR;  Service: Vascular;  Laterality: Left;   BASCILIC VEIN TRANSPOSITION Right 08/22/2014   Procedure: RIGHT UPPER ARM BASCILIC VEIN TRANSPOSITION;  Surgeon: Chuck Hint, MD;  Location: [facility] OR;  Service: Vascular;  Laterality: Right;   BELOW KNEE LEG AMPUTATION Right 04/26/2016   CARDIAC CATHETERIZATION     CARDIAC DEFIBRILLATOR PLACEMENT  06/27/2013   Sub Q       BY DR Graciela Husbands   CATARACT EXTRACTION W/PHACO Right 08/06/2018   Procedure: CATARACT EXTRACTION PHACO AND INTRAOCULAR LENS PLACEMENT (IOC);  Surgeon: Fabio Pierce, MD;  Location: [facility] ORS;  Service: Ophthalmology;  Laterality: Right;  CDE: 4.06   CATARACT EXTRACTION W/PHACO Left 08/20/2018   Procedure: CATARACT EXTRACTION PHACO AND INTRAOCULAR LENS PLACEMENT (IOC);  Surgeon: Fabio Pierce, MD;  Location: [facility] ORS;  Service: Ophthalmology;  Laterality: Left;  CDE: 6.76   COLONOSCOPY WITH PROPOFOL N/A 07/22/2015   Procedure: COLONOSCOPY WITH PROPOFOL;  Surgeon: Sherrilyn Rist, MD;  Location: [facility] ENDOSCOPY;  Service: Gastroenterology;  Laterality: N/A;   FEMORAL-POPLITEAL BYPASS GRAFT Right 03/31/2016   Procedure: BYPASS GRAFT FEMORAL-POPLITEAL ARTERY USING RIGHT GREATER SAPHENOUS NONREVERSED VEIN;  Surgeon: Chuck Hint, MD;  Location: [facility] OR;  Service: Vascular;  Laterality: Right;   HERNIA REPAIR     I & D EXTREMITY Right 03/31/2016   Procedure: IRRIGATION AND DEBRIDEMENT FOOT;   Surgeon: Chuck Hint, MD;  Location: [facility] OR;  Service: Vascular;  Laterality: Right;   IMPLANTABLE CARDIOVERTER DEFIBRILLATOR IMPLANT N/A 06/27/2013   Procedure: SUB Q ICD;  Surgeon: Duke Salvia, MD;  Location: [facility] CATH LAB;  Service: Cardiovascular;  Laterality: N/A;   INTRAOPERATIVE ARTERIOGRAM Right 03/31/2016   Procedure: INTRA OPERATIVE ARTERIOGRAM;  Surgeon: Chuck Hint, MD;  Location: [facility] OR;  Service: Vascular;  Laterality: Right;   IR GENERIC HISTORICAL Right 11/30/2015   IR THROMBECTOMY AV FISTULA W/THROMBOLYSIS/PTA INC/SHUNT/IMG RIGHT 11/30/2015 Irish Lack, MD [facility]   IR GENERIC HISTORICAL  11/30/2015   IR US GUIDE VASC ACCESS RIGHT 11/30/2015 Irish Lack, MD [facility]   IR GENERIC HISTORICAL Right 12/15/2015   IR THROMBECTOMY AV FISTULA W/THROMBOLYSIS/PTA/STENT INC/SHUNT/IMG RT 12/15/2015 Oley Balm, MD [facility]   IR GENERIC HISTORICAL  12/15/2015   IR US GUIDE VASC ACCESS RIGHT 12/15/2015 Oley Balm, MD [facility]   IR GENERIC HISTORICAL  12/28/2015  IR FLUORO GUIDE CV LINE RIGHT 12/28/2015 Jolaine Click, MD [facility]   IR GENERIC HISTORICAL  12/28/2015   IR US GUIDE VASC ACCESS RIGHT 12/28/2015 Jolaine Click, MD [facility]   LEFT A ND RIGHT HEART CATH  01/30/2013   DR Jones Broom   LEFT AND RIGHT HEART CATHETERIZATION WITH CORONARY ANGIOGRAM N/A 01/30/2013   Procedure: LEFT AND RIGHT HEART CATHETERIZATION WITH CORONARY ANGIOGRAM;  Surgeon: Dolores Patty, MD;  Location: [facility] CATH LAB;  Service: Cardiovascular;  Laterality: N/A;   PERIPHERAL VASCULAR CATHETERIZATION Right 01/26/2015   Procedure: A/V Fistulagram;  Surgeon: Chuck Hint, MD;  Location: [facility] INVASIVE CV LAB;  Service: Cardiovascular;  Laterality: Right;   reapea urethral surgery for recurrent obstruction  2011   TOTAL KNEE ARTHROPLASTY Right 2007   VEIN HARVEST Right 03/31/2016   Procedure: RIGHT GREATER SAPHENOUS VEIN HARVEST;  Surgeon: Chuck Hint, MD;  Location: [facility] OR;  Service: Vascular;  Laterality: Right;    SOCIAL HISTORY:   Social History   Tobacco Use   Smoking status: Former    Current packs/day: 0.00    Average packs/day: 2.0 packs/day for 32.0 years (64.0 ttl pk-yrs)    Types: Cigarettes    Start date: 05/11/1978    Quit date: 05/11/2010    Years since quitting: 12.9   Smokeless tobacco: Never  Substance Use Topics   Alcohol use: No    FAMILY HISTORY:   Family History  Problem Relation Age of Onset   Bladder Cancer Mother    Alcohol abuse Father    Melanoma Father    Stroke Maternal Grandmother    Heart Problems Maternal Grandmother        unknown   Diabetes Maternal Grandmother    Heart disease Maternal Grandfather    Prostate cancer Maternal Grandfather     DRUG ALLERGIES:   Allergies  Allergen Reactions   Epoetin Alfa Other (See Comments)    Unknown    Ferumoxytol Other (See Comments)    Unknown    Morphine Sulfate Rash and Other (See Comments)    Itches all over, red spots    REVIEW OF SYSTEMS:   ROS As per history of present illness. All pertinent systems were reviewed above. Constitutional, HEENT, cardiovascular, respiratory, GI, GU, musculoskeletal, neuro, psychiatric, endocrine, integumentary and hematologic systems were reviewed and are otherwise negative/unremarkable except for positive findings mentioned above in the HPI.   MEDICATIONS AT HOME:   Prior to Admission medications   Medication Sig Start Date End Date Taking? Authorizing Provider  acetaminophen (TYLENOL) 325 MG tablet Take 1-2 tablets (325-650 mg total) by mouth every 4 (four) hours as needed for mild pain. 11/29/19   Love, Evlyn Kanner, PA-C  apixaban (ELIQUIS) 5 MG TABS tablet Take 1 tablet (5 mg total) by mouth 2 (two) times daily. 12/21/22   Burchette, Elberta Fortis, MD  atorvastatin (LIPITOR) 80 MG tablet TAKE 1 TABLET BY MOUTH AT BEDTIME Patient taking differently: Take 80 mg by mouth daily. 06/24/22   Burchette, Elberta Fortis, MD  carvedilol (COREG) 12.5 MG tablet Take 1 tablet (12.5 mg total) by mouth 2 (two) times daily. 01/25/23   Burchette, Elberta Fortis, MD  cinacalcet (SENSIPAR) 30 MG tablet Take 1 tablet (30 mg total) by mouth Every Tuesday,Thursday,and Saturday with dialysis. 11/30/19   Love, Evlyn Kanner, PA-C  Continuous Glucose Receiver (FREESTYLE LIBRE 3 READER) DEVI Use to check blood glucose TID 08/11/22   Burchette, Elberta Fortis, MD  Continuous Glucose Sensor (FREESTYLE [place]  3 PLUS SENSOR) MISC Change sensor every 15 days. Use as directed to check glucose daily 12/21/22   Burchette, Elberta Fortis, MD  fenofibrate 160 MG tablet TAKE 1 TABLET BY MOUTH ONCE DAILY Patient taking differently: Take 160 mg by mouth daily. 06/24/22   Burchette, Elberta Fortis, MD  gabapentin (NEURONTIN) 300 MG capsule TAKE 2 CAPSULES BY MOUTH TWICE DAILY . Patient taking differently: Take 600 mg by mouth 2 (two) times daily. 06/24/22   Burchette, Elberta Fortis, MD  glucose blood test strip Check 1 time daily. E11.9 One Touch Ultra Blue Test Strips 03/10/14   Burchette, Elberta Fortis, MD  HUMALOG KWIKPEN 200 UNIT/ML KwikPen INJECT A MAXIMUM OF 28 UNITS SUBCUTANEOUSLY TWICE DAILY WITH LUNCH AND SUPPER PER SLIDING SCALE. APPOINTMENT REQUIRED FOR FUTURE REFILLS 11/11/22   Burchette, Elberta Fortis, MD  Insulin Pen Needle (BD PEN NEEDLE NANO U/F) 32G X 4 MM MISC USE 1 PEN NEEDLE SUBCUTANEOUSLY WITH INSULIN 4 TIMES DAILY 12/04/19   Burchette, Elberta Fortis, MD  LANTUS SOLOSTAR 100 UNIT/ML Solostar Pen INJECT 12 UNITS SUBCUTANEOUSLY AT BEDTIME Patient taking differently: Inject 14 Units into the skin at bedtime. 07/15/22   Burchette, Elberta Fortis, MD  midodrine (PROAMATINE) 10 MG tablet Take 10 mg by mouth 3 (three) times a week. EVERY TUESDAY, THURSDAY, AND SATURDAY 08/04/20   [provider]  multivitamin (RENA-VIT) TABS tablet Take 1 tablet by mouth once daily Patient taking differently: Take 1 tablet by mouth daily. 07/06/21   Burchette, Elberta Fortis, MD  Olopatadine HCl 0.2 % SOLN Place 1 drop  into both eyes daily as needed (for allergies).     [provider]  pantoprazole (PROTONIX) 20 MG tablet Take 20 mg by mouth daily. 02/28/23   [provider]  sevelamer (RENAGEL) 800 MG tablet Take 1,600 mg by mouth 3 (three) times daily with meals.    [provider]  VENTOLIN HFA 108 (90 Base) MCG/ACT inhaler Inhale 1-2 puffs into the lungs every 6 (six) hours as needed for wheezing or shortness of breath. 03/20/23   Burchette, Elberta Fortis, MD      VITAL SIGNS:  Blood pressure (!) 140/44, pulse 78, temperature 98 F (36.7 C), temperature source Oral, resp. rate (!) 21, height 5\' 10"  (1.778 m), weight 94 kg, SpO2 97%.  PHYSICAL EXAMINATION:  Physical Exam  GENERAL:  63 y.o.-year-old patient lying in the bed with no acute distress.  EYES: Pupils equal, round, reactive to light and accommodation. No scleral icterus. Extraocular muscles intact.  HEENT: Head atraumatic, normocephalic. Oropharynx and nasopharynx clear.  NECK:  Supple, no jugular venous distention. No thyroid enlargement, no tenderness.  LUNGS: Normal breath sounds bilaterally, no wheezing, rales,rhonchi or crepitation. No use of accessory muscles of respiration.  CARDIOVASCULAR: Regular rate and rhythm, S1, S2 normal. No murmurs, rubs, or gallops.  ABDOMEN: Soft, nondistended, nontender. Bowel sounds present. No organomegaly or mass.  EXTREMITIES: He is status post bilateral BKA.   NEUROLOGIC: Cranial nerves II through XII are intact. Muscle strength 5/5 in all extremities. Sensation intact. Gait not checked.  PSYCHIATRIC: The patient is alert and oriented x 3.  Normal affect and good eye contact. SKIN: No obvious rash, lesion, or ulcer.   LABORATORY PANEL:   CBC Recent Labs  Lab 04/10/23 2210  WBC 11.5*  HGB 12.2*  HCT 36.5*  PLT 218   ------------------------------------------------------------------------------------------------------------------  Chemistries  Recent Labs  Lab  04/10/23 2210  NA 130*  K 4.0  CL 92*  CO2 20*  GLUCOSE  138*  BUN 78*  CREATININE 10.44*  CALCIUM 8.7*  AST 37  ALT 25  ALKPHOS 114  BILITOT 0.8   ------------------------------------------------------------------------------------------------------------------  Cardiac Enzymes No results for input(s): "TROPONINI" in the last 168 hours. ------------------------------------------------------------------------------------------------------------------  RADIOLOGY:  DG Chest Portable 1 View Result Date: 04/10/2023 CLINICAL DATA:  Short of breath, COPD EXAM: PORTABLE CHEST 1 VIEW COMPARISON:  03/06/2023 FINDINGS: Single frontal view of the chest demonstrates stable right internal jugular dialysis catheter and single lead defibrillator. Cardiac silhouette is stable. No acute airspace disease, effusion, or pneumothorax. No acute bony abnormalities. IMPRESSION: 1. Stable chest, no acute process. Electronically Signed   By: Sharlet Salina M.D.   On: 04/10/2023 21:52      IMPRESSION AND PLAN:  Assessment and Plan: * NSTEMI (non-ST elevated myocardial infarction) [facility]) - The patient will be admitted to an observation cardiac telemetry bed. - The patient be placed on IV heparin drip to replace Eliquis.. - Will follow serial troponins and EKGs. - The patient will be placed on aspirin as well as p.r.n. sublingual nitroglycerin and morphine sulfate for pain. - We will obtain a cardiology consult in a.m. for further cardiac risk stratification. - Dr. Lendell Caprice was notified about the patient will need to be called upon patient's arrival to [facility].   Type 2 diabetes mellitus with peripheral neuropathy (HCC) - The patient will be placed on supplemental coverage with NovoLog. - We will continue basal coverage. - We will continue Neurontin.  Paroxysmal atrial fibrillation (HCC) - The patient will be placed on IV heparin as mentioned above to replace Eliquis. - We will continue beta-blocker  therapy.  End-stage renal disease on hemodialysis [facility]) - The patient will need nephrology consult in AM. - We will continue Renagel and Sensipar.  GERD without esophagitis - We will continue PPI therapy.  Dyslipidemia - We will continue statin therapy as well as fenofibrate and check fasting lipids.    DVT prophylaxis: IV heparin. Advanced Care Planning:  Code Status: full code.  Family Communication:  The plan of care was discussed in details with the patient (and family). I answered all questions. The patient agreed to proceed with the above mentioned plan. Further management will depend upon hospital course. Disposition Plan: Back to previous home environment Consults called: none.  All the records are reviewed and case discussed with ED provider.  Status is: Inpatient   At the time of the admission, it appears that the appropriate admission status for this patient is inpatient.  This is judged to be reasonable and necessary in order to provide the required intensity of service to ensure the patient's safety given the presenting symptoms, physical exam findings and initial radiographic and laboratory data in the context of comorbid conditions.  The patient requires inpatient status due to high intensity of service, high risk of further deterioration and high frequency of surveillance required.  I certify that at the time of admission, it is my clinical judgment that the patient will require inpatient hospital care extending more than 2 midnights.                            Dispo: The patient is from: Home              Anticipated d/c is to: Home              Patient currently is not medically stable to d/c.  Difficult to place patient: No  Hannah Beat M.D on 04/11/2023 at 4:00 AM  [facility]   From 7 PM-7 AM, contact night-coverage [email or web address]  CC: Primary care physician; Kristian Covey, MD

## 2023-04-11 NOTE — Consult Note (Signed)
 [facility] Renal Consultation Note    Indication for Consultation:  Management of ESRD/hemodialysis; anemia, hypertension/volume and secondary hyperparathyroidism  HPI: Anthony Bullock is a 63 y.o. male with a past medical history  significant for diabetes mellitus, hypotension on midodrine, COPD, CHFrEF with nonischemic cardiomyopathy status post ICD placement, peripheral vascular disease status post bilateral BKA and end-stage renal disease on hemodialysis TTS at [facility], who presented to [facility] via EMS on 04/10/23 with worsening SOB.  In the ED, Temp 98.1, Bp 120/65, HR 87, RR 18, SpO2 99%.  Labs were notable for WBC 11.5, Hgb 12.2, Na 130.  CXR without acute cardiopulmonary disease.  Troponin I's climbing to over 15,000.  He is being admitted to [facility] for NSTEMI and we were consulted to provide dialysis during his hospitalization.  He reports that he has been compliant with dialysis and his last treatment was Saturday.  He also reports that he got below his EDW at that time.  He is refusing transfer to [facility] until he gets dialysis but we already have patient's running and he was informed that he would have to wait around 4 hours.  He understands but does not want to transfer until he gets dialysis here.   Past Medical History:  Diagnosis Date   AICD (automatic cardioverter/defibrillator) present    [employer]   Allergic rhinitis    Anemia    Arthritis    Chronic systolic heart failure (HCC)    a. ECHO (12/2012) EF 25-30%, HK entireanteroseptal myocardium //  b.  EF 25%, diffuse HK, grade 1 diastolic dysfunction, MAC, mild LAE, normal RVSF, trivial pericardial effusion   COPD (chronic obstructive pulmonary disease) (HCC)    Diabetes mellitus type II    Diabetic nephropathy (HCC)    Diabetic neuropathy (HCC)    ESRD on hemodialysis (HCC)    started HD June 2017, goes to [facility] HD unit, Dr Fausto Skillern   History of cardiac catheterization    a.Myoview 1/15:  There is  significant left ventricular dysfunction. There may be slight scar at the apex. There is no significant ischemia. LV Ejection Fraction: 27%  //  b. RHC/LHC (1/15) with mean RA 6, PA 47/22 mean 33, mean PCWP 20, PVR 2.5 WU, CI 2.5; 80% dLAD stenosis, 70% diffuse large D.     History of kidney stones    Hyperlipidemia    Hypertension    Kidney stones    NICM (nonischemic cardiomyopathy) (HCC)    Primarily nonischemic.  Echo (12/14) with EF 25-30%.  Echo (3/15) with EF 25%, mild to moderately dilated LV, normal RV size and systolic function.     Osteomyelitis (HCC)    left fifth ray   Pneumonia    Urethral stricture    Wears glasses    Past Surgical History:  Procedure Laterality Date   ABDOMINAL AORTOGRAM W/LOWER EXTREMITY N/A 03/30/2016   Procedure: Abdominal Aortogram w/Lower Extremity;  Surgeon: Chuck Hint, MD;  Location: [facility] INVASIVE CV LAB;  Service: Cardiovascular;  Laterality: N/A;   AMPUTATION Right 04/26/2016   Procedure: Right Below Knee Amputation;  Surgeon: Nadara Mustard, MD;  Location: [facility] OR;  Service: Orthopedics;  Laterality: Right;   AMPUTATION Left 08/21/2019   Procedure: LEFT FOOT 5TH RAY AMPUTATION;  Surgeon: Nadara Mustard, MD;  Location: [facility] OR;  Service: Orthopedics;  Laterality: Left;   AMPUTATION Left 11/13/2019   Procedure: LEFT BELOW KNEE AMPUTATION;  Surgeon: Nadara Mustard, MD;  Location: [facility] OR;  Service:  Orthopedics;  Laterality: Left;   AV FISTULA PLACEMENT Right 09/08/2015   Procedure: INSERTION OF 4-64mm x 45cm  ARTERIOVENOUS (AV) GORE-TEX GRAFT RIGHT UPPER  ARM;  Surgeon: Chuck Hint, MD;  Location: [facility] OR;  Service: Vascular;  Laterality: Right;   AV FISTULA PLACEMENT Left 01/14/2016   Procedure: CREATION OF LEFT UPPER ARM ARTERIOVENOUS FISTULA;  Surgeon: Chuck Hint, MD;  Location: [facility] OR;  Service: Vascular;  Laterality: Left;   BASCILIC VEIN TRANSPOSITION Right 08/22/2014   Procedure: RIGHT UPPER ARM BASCILIC VEIN TRANSPOSITION;   Surgeon: Chuck Hint, MD;  Location: [facility] OR;  Service: Vascular;  Laterality: Right;   BELOW KNEE LEG AMPUTATION Right 04/26/2016   CARDIAC CATHETERIZATION     CARDIAC DEFIBRILLATOR PLACEMENT  06/27/2013   Sub Q       BY DR Graciela Husbands   CATARACT EXTRACTION W/PHACO Right 08/06/2018   Procedure: CATARACT EXTRACTION PHACO AND INTRAOCULAR LENS PLACEMENT (IOC);  Surgeon: Fabio Pierce, MD;  Location: [facility] ORS;  Service: Ophthalmology;  Laterality: Right;  CDE: 4.06   CATARACT EXTRACTION W/PHACO Left 08/20/2018   Procedure: CATARACT EXTRACTION PHACO AND INTRAOCULAR LENS PLACEMENT (IOC);  Surgeon: Fabio Pierce, MD;  Location: [facility] ORS;  Service: Ophthalmology;  Laterality: Left;  CDE: 6.76   COLONOSCOPY WITH PROPOFOL N/A 07/22/2015   Procedure: COLONOSCOPY WITH PROPOFOL;  Surgeon: Sherrilyn Rist, MD;  Location: [facility] END[facility]COPY;  Service: Gastroenterology;  Laterality: N/A;   FEMORAL-POPLITEAL BYPASS GRAFT Right 03/31/2016   Procedure: BYPASS GRAFT FEMORAL-POPLITEAL ARTERY USING RIGHT GREATER SAPHENOUS NONREVERSED VEIN;  Surgeon: Chuck Hint, MD;  Location: [facility] OR;  Service: Vascular;  Laterality: Right;   HERNIA REPAIR     I & D EXTREMITY Right 03/31/2016   Procedure: IRRIGATION AND DEBRIDEMENT FOOT;  Surgeon: Chuck Hint, MD;  Location: [facility] OR;  Service: Vascular;  Laterality: Right;   IMPLANTABLE CARDIOVERTER DEFIBRILLATOR IMPLANT N/A 06/27/2013   Procedure: SUB Q ICD;  Surgeon: Duke Salvia, MD;  Location: [facility] CATH LAB;  Service: Cardiovascular;  Laterality: N/A;   INTRAOPERATIVE ARTERIOGRAM Right 03/31/2016   Procedure: INTRA OPERATIVE ARTERIOGRAM;  Surgeon: Chuck Hint, MD;  Location: [facility] OR;  Service: Vascular;  Laterality: Right;   IR GENERIC HISTORICAL Right 11/30/2015   IR THROMBECTOMY AV FISTULA W/THROMBOLYSIS/PTA INC/SHUNT/IMG RIGHT 11/30/2015 Irish Lack, MD [facility]   IR GENERIC HISTORICAL  11/30/2015   IR US GUIDE VASC ACCESS RIGHT 11/30/2015 Irish Lack, MD [facility]   IR GENERIC HISTORICAL Right 12/15/2015   IR THROMBECTOMY AV FISTULA W/THROMBOLYSIS/PTA/STENT INC/SHUNT/IMG RT 12/15/2015 Oley Balm, MD [facility]   IR GENERIC HISTORICAL  12/15/2015   IR US GUIDE VASC ACCESS RIGHT 12/15/2015 Oley Balm, MD [facility]   IR GENERIC HISTORICAL  12/28/2015   IR FLUORO GUIDE CV LINE RIGHT 12/28/2015 Jolaine Click, MD [facility]   IR GENERIC HISTORICAL  12/28/2015   IR US GUIDE VASC ACCESS RIGHT 12/28/2015 Jolaine Click, MD [facility]   LEFT A ND RIGHT HEART CATH  01/30/2013   DR Jones Broom   LEFT AND RIGHT HEART CATHETERIZATION WITH CORONARY ANGIOGRAM N/A 01/30/2013   Procedure: LEFT AND RIGHT HEART CATHETERIZATION WITH CORONARY ANGIOGRAM;  Surgeon: Dolores Patty, MD;  Location: [facility] CATH LAB;  Service: Cardiovascular;  Laterality: N/A;   PERIPHERAL VASCULAR CATHETERIZATION Right 01/26/2015   Procedure: A/V Fistulagram;  Surgeon: Chuck Hint, MD;  Location: [facility] INVASIVE CV LAB;  Service: Cardiovascular;  Laterality: Right;   reapea urethral surgery for recurrent obstruction  2011   TOTAL KNEE ARTHROPLASTY Right 2007   VEIN HARVEST Right 03/31/2016   Procedure: RIGHT GREATER SAPHENOUS VEIN HARVEST;  Surgeon: Chuck Hint, MD;  Location: [facility] OR;  Service: Vascular;  Laterality: Right;   Family History:   Family History  Problem Relation Age of Onset   Bladder Cancer Mother    Alcohol abuse Father    Melanoma Father    Stroke Maternal Grandmother    Heart Problems Maternal Grandmother        unknown   Diabetes Maternal Grandmother    Heart disease Maternal Grandfather    Prostate cancer Maternal Grandfather    Social History:  reports that he quit smoking about 12 years ago. His smoking use included cigarettes. He started smoking about 44 years ago. He has a 64 pack-year smoking history. He has never used smokeless tobacco. He reports that he does not drink alcohol and does not use drugs. Allergies   Allergen Reactions   Epoetin Alfa Other (See Comments)    Unknown    Ferumoxytol Other (See Comments)    Unknown    Morphine Sulfate Rash and Other (See Comments)    Itches all over, red spots   Prior to Admission medications   Medication Sig Start Date End Date Taking? Authorizing Provider  acetaminophen (TYLENOL) 325 MG tablet Take 1-2 tablets (325-650 mg total) by mouth every 4 (four) hours as needed for mild pain. 11/29/19   Love, Evlyn Kanner, PA-C  apixaban (ELIQUIS) 5 MG TABS tablet Take 1 tablet (5 mg total) by mouth 2 (two) times daily. 12/21/22   Burchette, Elberta Fortis, MD  atorvastatin (LIPITOR) 80 MG tablet TAKE 1 TABLET BY MOUTH AT BEDTIME Patient taking differently: Take 80 mg by mouth daily. 06/24/22   Burchette, Elberta Fortis, MD  carvedilol (COREG) 12.5 MG tablet Take 1 tablet (12.5 mg total) by mouth 2 (two) times daily. 01/25/23   Burchette, Elberta Fortis, MD  cinacalcet (SENSIPAR) 30 MG tablet Take 1 tablet (30 mg total) by mouth Every Tuesday,Thursday,and Saturday with dialysis. 11/30/19   Love, Evlyn Kanner, PA-C  Continuous Glucose Receiver (FREESTYLE LIBRE 3 READER) DEVI Use to check blood glucose TID 08/11/22   Burchette, Elberta Fortis, MD  Continuous Glucose Sensor (FREESTYLE LIBRE 3 PLUS SENSOR) MISC Change sensor every 15 days. Use as directed to check glucose daily 12/21/22   Burchette, Elberta Fortis, MD  fenofibrate 160 MG tablet TAKE 1 TABLET BY MOUTH ONCE DAILY Patient taking differently: Take 160 mg by mouth daily. 06/24/22   Burchette, Elberta Fortis, MD  gabapentin (NEURONTIN) 300 MG capsule TAKE 2 CAPSULES BY MOUTH TWICE DAILY . Patient taking differently: Take 600 mg by mouth 2 (two) times daily. 06/24/22   Burchette, Elberta Fortis, MD  glucose blood test strip Check 1 time daily. E11.9 One Touch Ultra Blue Test Strips 03/10/14   Burchette, Elberta Fortis, MD  HUMALOG KWIKPEN 200 UNIT/ML KwikPen INJECT A MAXIMUM OF 28 UNITS SUBCUTANEOUSLY TWICE DAILY WITH LUNCH AND SUPPER PER SLIDING SCALE. APPOINTMENT REQUIRED FOR  FUTURE REFILLS 11/11/22   Burchette, Elberta Fortis, MD  Insulin Pen Needle (BD PEN NEEDLE NANO U/F) 32G X 4 MM MISC USE 1 PEN NEEDLE SUBCUTANEOUSLY WITH INSULIN 4 TIMES DAILY 12/04/19   Burchette, Elberta Fortis, MD  LANTUS SOLOSTAR 100 UNIT/ML Solostar Pen INJECT 12 UNITS SUBCUTANEOUSLY AT BEDTIME Patient taking differently: Inject 14 Units into the skin at bedtime. 07/15/22   Burchette, Elberta Fortis, MD  midodrine (PROAMATINE) 10 MG tablet Take 10 mg  by mouth 3 (three) times a week. EVERY TUESDAY, THURSDAY, AND SATURDAY 08/04/20   [provider]  multivitamin (RENA-VIT) TABS tablet Take 1 tablet by mouth once daily Patient taking differently: Take 1 tablet by mouth daily. 07/06/21   Burchette, Elberta Fortis, MD  Olopatadine HCl 0.2 % SOLN Place 1 drop into both eyes daily as needed (for allergies).     [provider]  pantoprazole (PROTONIX) 20 MG tablet Take 20 mg by mouth daily. 02/28/23   [provider]  sevelamer (RENAGEL) 800 MG tablet Take 1,600 mg by mouth 3 (three) times daily with meals.    [provider]  VENTOLIN HFA 108 (90 Base) MCG/ACT inhaler Inhale 1-2 puffs into the lungs every 6 (six) hours as needed for wheezing or shortness of breath. 03/20/23   Burchette, Elberta Fortis, MD   Current Facility-Administered Medications  Medication Dose Route Frequency Provider Last Rate Last Admin   acetaminophen (TYLENOL) tablet 650 mg  650 mg Oral Q4H PRN Mansy, Vernetta Honey, MD       ALPRAZolam Prudy Feeler) tablet 0.25 mg  0.25 mg Oral BID PRN Mansy, Vernetta Honey, MD       [START ON 04/12/2023] aspirin EC tablet 81 mg  81 mg Oral Daily Mansy, Jan A, MD       atorvastatin (LIPITOR) tablet 80 mg  80 mg Oral QHS Mansy, Jan A, MD       carvedilol (COREG) tablet 12.5 mg  12.5 mg Oral BID Mansy, Jan A, MD   12.5 mg at 04/11/23 7829   Chlorhexidine Gluconate Cloth 2 % PADS 6 each  6 each Topical Q0600 Terrial Rhodes, MD       cinacalcet [facility]) tablet 30 mg  30 mg Oral Q T,Th,Sa-HD Mansy, Jan A, MD        fenofibrate tablet 160 mg  160 mg Oral Daily Mansy, Jan A, MD       fentaNYL (SUBLIMAZE) injection 12.5 mcg  12.5 mcg Intravenous Q2H PRN Johnson, Clanford L, MD       furosemide (LASIX) injection 40 mg  40 mg Intravenous Once Mansy, Jan A, MD       gabapentin (NEURONTIN) capsule 600 mg  600 mg Oral BID Mansy, Jan A, MD       heparin ADULT infusion 100 units/mL (25000 units/249mL)  1,200 Units/hr Intravenous Continuous Arabella Merles, RPH 12 mL/hr at 04/11/23 0842 1,200 Units/hr at 04/11/23 0842   insulin aspart (novoLOG) injection 0-15 Units  0-15 Units Subcutaneous Q4H Mansy, Jan A, MD       insulin glargine-yfgn [facility]) injection 12 Units  12 Units Subcutaneous QHS Mansy, Jan A, MD       magnesium hydroxide (MILK OF MAGNESIA) suspension 30 mL  30 mL Oral Daily PRN Mansy, Jan A, MD       [START ON 04/12/2023] midodrine (PROAMATINE) tablet 10 mg  10 mg Oral Once per day on Monday Wednesday Friday Mansy, Jan A, MD       multivitamin (RENA-VIT) tablet 1 tablet  1 tablet Oral Daily Mansy, Jan A, MD       nitroGLYCERIN (NITROSTAT) SL tablet 0.4 mg  0.4 mg Sublingual Q5 Min x 3 PRN Mansy, Jan A, MD       olopatadine (PATANOL) 0.1 % ophthalmic solution 1 drop  1 drop Both Eyes BID Mansy, Jan A, MD       ondansetron [facility]) injection 4 mg  4 mg Intravenous Q6H PRN Mansy, Vernetta Honey, MD  pantoprazole (PROTONIX) EC tablet 40 mg  40 mg Oral Daily Mansy, Jan A, MD       sevelamer carbonate (RENVELA) tablet 1,600 mg  1,600 mg Oral TID WC Mansy, Vernetta Honey, MD       traZODone (DESYREL) tablet 25 mg  25 mg Oral QHS PRN Mansy, Vernetta Honey, MD       Current Outpatient Medications  Medication Sig Dispense Refill   acetaminophen (TYLENOL) 325 MG tablet Take 1-2 tablets (325-650 mg total) by mouth every 4 (four) hours as needed for mild pain.     apixaban (ELIQUIS) 5 MG TABS tablet Take 1 tablet (5 mg total) by mouth 2 (two) times daily. 180 tablet 3   atorvastatin (LIPITOR) 80 MG tablet TAKE 1 TABLET BY MOUTH AT BEDTIME  (Patient taking differently: Take 80 mg by mouth daily.) 90 tablet 3   carvedilol (COREG) 12.5 MG tablet Take 1 tablet (12.5 mg total) by mouth 2 (two) times daily. 180 tablet 3   cinacalcet (SENSIPAR) 30 MG tablet Take 1 tablet (30 mg total) by mouth Every Tuesday,Thursday,and Saturday with dialysis. 60 tablet    Continuous Glucose Receiver (FREESTYLE LIBRE 3 READER) DEVI Use to check blood glucose TID 1 each 0   Continuous Glucose Sensor (FREESTYLE LIBRE 3 PLUS SENSOR) MISC Change sensor every 15 days. Use as directed to check glucose daily 6 each 3   fenofibrate 160 MG tablet TAKE 1 TABLET BY MOUTH ONCE DAILY (Patient taking differently: Take 160 mg by mouth daily.) 90 tablet 3   gabapentin (NEURONTIN) 300 MG capsule TAKE 2 CAPSULES BY MOUTH TWICE DAILY . (Patient taking differently: Take 600 mg by mouth 2 (two) times daily.) 360 capsule 3   glucose blood test strip Check 1 time daily. E11.9 One Touch Ultra Blue Test Strips 100 each 3   HUMALOG KWIKPEN 200 UNIT/ML KwikPen INJECT A MAXIMUM OF 28 UNITS SUBCUTANEOUSLY TWICE DAILY WITH LUNCH AND SUPPER PER SLIDING SCALE. APPOINTMENT REQUIRED FOR FUTURE REFILLS 6 mL 5   Insulin Pen Needle (BD PEN NEEDLE NANO U/F) 32G X 4 MM MISC USE 1 PEN NEEDLE SUBCUTANEOUSLY WITH INSULIN 4 TIMES DAILY 400 each 0   LANTUS SOLOSTAR 100 UNIT/ML Solostar Pen INJECT 12 UNITS SUBCUTANEOUSLY AT BEDTIME (Patient taking differently: Inject 14 Units into the skin at bedtime.) 15 mL 0   midodrine (PROAMATINE) 10 MG tablet Take 10 mg by mouth 3 (three) times a week. EVERY TUESDAY, THURSDAY, AND SATURDAY     multivitamin (RENA-VIT) TABS tablet Take 1 tablet by mouth once daily (Patient taking differently: Take 1 tablet by mouth daily.) 10 tablet 3   Olopatadine HCl 0.2 % SOLN Place 1 drop into both eyes daily as needed (for allergies).      pantoprazole (PROTONIX) 20 MG tablet Take 20 mg by mouth daily.     sevelamer (RENAGEL) 800 MG tablet Take 1,600 mg by mouth 3 (three) times  daily with meals.     VENTOLIN HFA 108 (90 Base) MCG/ACT inhaler Inhale 1-2 puffs into the lungs every 6 (six) hours as needed for wheezing or shortness of breath. 18 g 1   Labs: Basic Metabolic Panel: Recent Labs  Lab 04/10/23 2210 04/11/23 0505  NA 130* 129*  K 4.0 3.8  CL 92* 95*  CO2 20* 19*  GLUCOSE 138* 120*  BUN 78* 83*  CREATININE 10.44* 10.82*  CALCIUM 8.7* 8.4*   Liver Function Tests: Recent Labs  Lab 04/10/23 2210  AST 37  ALT 25  ALKPHOS 114  BILITOT 0.8  PROT 7.5  ALBUMIN 3.5   No results for input(s): "LIPASE", "AMYLASE" in the last 168 hours. No results for input(s): "AMMONIA" in the last 168 hours. CBC: Recent Labs  Lab 04/10/23 2210 04/11/23 0532  WBC 11.5* 10.3  NEUTROABS 8.6*  --   HGB 12.2* 10.6*  HCT 36.5* 32.5*  MCV 88.6 90.3  PLT 218 187   Cardiac Enzymes: No results for input(s): "CKTOTAL", "CKMB", "CKMBINDEX", "TROPONINI" in the last 168 hours. CBG: Recent Labs  Lab 04/11/23 0427 04/11/23 0758  GLUCAP 120* 111*   Iron Studies: No results for input(s): "IRON", "TIBC", "TRANSFERRIN", "FERRITIN" in the last 72 hours. Studies/Results: DG Chest Portable 1 View Result Date: 04/10/2023 CLINICAL DATA:  Short of breath, COPD EXAM: PORTABLE CHEST 1 VIEW COMPARISON:  03/06/2023 FINDINGS: Single frontal view of the chest demonstrates stable right internal jugular dialysis catheter and single lead defibrillator. Cardiac silhouette is stable. No acute airspace disease, effusion, or pneumothorax. No acute bony abnormalities. IMPRESSION: 1. Stable chest, no acute process. Electronically Signed   By: Sharlet Salina M.D.   On: 04/10/2023 21:52    ROS: Pertinent items are noted in HPI. Physical Exam: Vitals:   04/11/23 0500 04/11/23 0600 04/11/23 0754 04/11/23 0800  BP: (!) 106/39 (!) 118/33 (!) 124/41 (!) 124/41  Pulse: 72 70 70 71  Resp:    (!) 21  Temp: 98.2 F (36.8 C)     TempSrc: Oral     SpO2: 98% 96%  98%  Weight:      Height:           Weight change:   Intake/Output Summary (Last 24 hours) at 04/11/2023 1610 Last data filed at 04/11/2023 0411 Gross per 24 hour  Intake 160.07 ml  Output --  Net 160.07 ml   BP (!) 124/41   Pulse 71   Temp 98.2 F (36.8 C) (Oral)   Resp (!) 21   Ht 5\' 10"  (1.778 m)   Wt 94 kg   SpO2 98%   BMI 29.73 kg/m  General appearance: alert, cooperative, and no distress Head: Normocephalic, without obvious abnormality, atraumatic Eyes: negative findings: lids and lashes normal, conjunctivae and sclerae normal, and corneas clear Resp: clear to auscultation bilaterally Cardio: regular rate and rhythm, S1, S2 normal, no murmur, click, rub or gallop GI: soft, non-tender; bowel sounds normal; no masses,  no organomegaly Extremities: s/p bilateral BKA's, no edema Dialysis Access:  Dialysis Orders: Center:  [facility]  on TTS . EDW 85.5 kg HD Bath 1K/2.5Ca  Time 3:30 Heparin none. Access TDC BFR 400 DFR 500    Micera 30 mcg IV/HD   Venofer  50 mg IV weekly   Assessment/Plan:  NSTEMI - cardiology consulted and started on IV heparin drip.  He is without chest pain.  He is due to be transferred to [facility], however he refuses to leave until he has dialysis.  ESRD -   HD today as soon as RN completes the other treatments.  She did call [facility] for assistance with his HD in the ED but we are awaiting reply.  Hypertension/volume  - volume is up and will UF as tolerated with HD  Anemia  - Hgb stable  Metabolic bone disease -   continue with home meds  Nutrition -  renal diet, carb modified.  Irena Cords, MD [facility], [facility] Pager [phone] 04/11/2023, 9:24 AM

## 2023-04-11 NOTE — Progress Notes (Signed)
   04/11/23 2353  BiPAP/CPAP/SIPAP  $ Non-Invasive Ventilator  Non-Invasive Vent Initial  $ Face Mask Medium Yes  BiPAP/CPAP/SIPAP Pt Type Adult  BiPAP/CPAP/SIPAP Resmed  Mask Type Full face mask  Mask Size Medium  EPAP 5 cmH2O  FiO2 (%) 21 %  Patient Home Machine No  Patient Home Mask No  Patient Home Tubing No  Auto Titrate No  Device Plugged into RED Power Outlet Yes  BiPAP/CPAP /SiPAP Vitals  Pulse Rate 91  Resp 20  SpO2 98 %  Bilateral Breath Sounds Clear  MEWS Score/Color  MEWS Score 0  MEWS Score Color [place] Si

## 2023-04-11 NOTE — Assessment & Plan Note (Addendum)
-   We will continue statin therapy as well as fenofibrate and check fasting lipids.

## 2023-04-11 NOTE — Assessment & Plan Note (Signed)
 -  The patient will be placed on supplemental coverage with NovoLog. - We will continue basal coverage. - We will continue Neurontin.

## 2023-04-11 NOTE — Hospital Course (Addendum)
 63 year old male with type 2 diabetes mellitus with diabetic nephropathy, neuropathy and vascular complications on hemodialysis TTS, hypertension, PVD status post bilateral BKA, dyslipidemia, urolithiasis, NICM missed HD last Sat presented to ED with shortness of breath.  He called EMS.  He reported mild symptoms of mid-sternal chest pain/pressure.  He denied cough fever and chills.  During the ED workup he was noted to have rising troponin high-sensitivity troponin tests.  These were much higher than his baseline tests.  The ED team spoke with cardiology who recommended transfer to [facility]. I reached out to nephrology team and Dr. Arrie Aran notified of need for HD.

## 2023-04-11 NOTE — ED Notes (Signed)
 Report to carelink. Patient leaving at this time to [facility] via carelink.

## 2023-04-11 NOTE — Progress Notes (Signed)
 PROGRESS NOTE   Anthony Bullock  AOZ:[id number] DOB: 01/15/1960 DOA: 04/10/2023 PCP: Kristian Covey, MD   Chief Complaint  Patient presents with   Shortness of Breath   Level of care: Progressive  Brief Admission History:  63 year old male with type 2 diabetes mellitus with diabetic nephropathy, neuropathy and vascular complications on hemodialysis TTS, hypertension, PVD status post bilateral BKA, dyslipidemia, urolithiasis, NICM missed HD last Sat presented to ED with shortness of breath.  He called EMS.  He reported mild symptoms of mid-sternal chest pain/pressure.  He denied cough fever and chills.  During the ED workup he was noted to have rising troponin high-sensitivity troponin tests.  These were much higher than his baseline tests.  The ED team spoke with cardiology who recommended transfer to [facility]. I reached out to nephrology team and Dr. Arrie Aran notified of need for HD.    Assessment and Plan:  NSTEMI - Pt waiting for bed at [facility] and spoke with cardiology service will remain on [facility] service - continue IV heparin infusion - Will follow serial troponins and cardiac monitoring. - cardiology consultation - discussed with B Strader and Dr. Wyline Mood on 4/1  End-stage renal disease on hemodialysis  - I consulted with Dr. Arrie Aran, pt has been refusing to transfer to [facility] without HD treatment at [facility]. - continue Renagel and Sensipar.  GERD without esophagitis - continue PPI therapy.  Dyslipidemia - continue statin therapy as well as fenofibrate and LDL notably 7 on lipid panel  Type 2 diabetes mellitus with peripheral neuropathy  - The patient will be placed on supplemental coverage with NovoLog. - continue basal coverage. - continue Neurontin. CBG (last 3)  Recent Labs    04/11/23 0427 04/11/23 0758  GLUCAP 120* 111*   Paroxysmal atrial fibrillation  - continue IV heparin infusion - continue beta-blocker therapy for now pending cardiology eval  DVT prophylaxis:   IV heparin infusion  Code Status: FULL  Family Communication: discussed plan of care with patient on 2 separate occasions  Disposition: transfer to [facility] when bed available   Consultants:  Cardiology nephrology  Procedures:   Antimicrobials:    Subjective: Pt denies chest pain, he says he will not transfer to [facility] without having dialysis at [facility]. He denies SOB.   Objective: Vitals:   04/11/23 0600 04/11/23 0754 04/11/23 0800 04/11/23 0900  BP: (!) 118/33 (!) 124/41 (!) 124/41 (!) 129/39  Pulse: 70 70 71 73  Resp:   (!) 21 18  Temp:      TempSrc:      SpO2: 96%  98% 100%  Weight:      Height:        Intake/Output Summary (Last 24 hours) at 04/11/2023 0943 Last data filed at 04/11/2023 0411 Gross per 24 hour  Intake 160.07 ml  Output --  Net 160.07 ml   Filed Weights   04/10/23 2026  Weight: 94 kg   Examination:  General exam: chronically ill appearing male, awake, alert, oriented, Appears calm and comfortable  Respiratory system: bibasilar crackles heard. Respiratory effort normal. Cardiovascular system: normal S1 & S2 heard. No JVD, murmurs, rubs, gallops or clicks. No pedal edema. Gastrointestinal system: Abdomen is nondistended, soft and nontender. No organomegaly or masses felt. Normal bowel sounds heard. Central nervous system: Alert and oriented. No focal neurological deficits. Extremities: bilateral BKA. Skin: No rashes, lesions or ulcers. Psychiatry: Judgement and insight appear diminished. Mood & affect agitated.   Data Reviewed: I have personally reviewed following labs and  imaging studies  CBC: Recent Labs  Lab 04/10/23 2210 04/11/23 0532  WBC 11.5* 10.3  NEUTROABS 8.6*  --   HGB 12.2* 10.6*  HCT 36.5* 32.5*  MCV 88.6 90.3  PLT 218 187    Basic Metabolic Panel: Recent Labs  Lab 04/10/23 2210 04/11/23 0505  NA 130* 129*  K 4.0 3.8  CL 92* 95*  CO2 20* 19*  GLUCOSE 138* 120*  BUN 78* 83*  CREATININE 10.44* 10.82*  CALCIUM 8.7* 8.4*     CBG: Recent Labs  Lab 04/11/23 0427 04/11/23 0758  GLUCAP 120* 111*    No results found for this or any previous visit (from the past 240 hours).   Radiology Studies: DG Chest Portable 1 View Result Date: 04/10/2023 CLINICAL DATA:  Short of breath, COPD EXAM: PORTABLE CHEST 1 VIEW COMPARISON:  03/06/2023 FINDINGS: Single frontal view of the chest demonstrates stable right internal jugular dialysis catheter and single lead defibrillator. Cardiac silhouette is stable. No acute airspace disease, effusion, or pneumothorax. No acute bony abnormalities. IMPRESSION: 1. Stable chest, no acute process. Electronically Signed   By: Sharlet Salina M.D.   On: 04/10/2023 21:52    Scheduled Meds:  [START ON 04/12/2023] aspirin EC  81 mg Oral Daily   atorvastatin  80 mg Oral QHS   carvedilol  12.5 mg Oral BID   Chlorhexidine Gluconate Cloth  6 each Topical Q0600   cinacalcet  30 mg Oral Q T,Th,Sa-HD   fenofibrate  160 mg Oral Daily   furosemide  40 mg Intravenous Once   gabapentin  600 mg Oral BID   insulin aspart  0-15 Units Subcutaneous Q4H   insulin glargine-yfgn  12 Units Subcutaneous QHS   [START ON 04/12/2023] midodrine  10 mg Oral Once per day on Monday Wednesday Friday   multivitamin  1 tablet Oral Daily   olopatadine  1 drop Both Eyes BID   pantoprazole  40 mg Oral Daily   sevelamer carbonate  1,600 mg Oral TID WC   Continuous Infusions:  heparin 1,200 Units/hr (04/11/23 0842)     LOS: 0 days   Critical Care Procedure Note Authorized and Performed by: Maryln Manuel MD  Total Critical Care time:  57 mins Due to a high probability of clinically significant, life threatening deterioration, the patient required my highest level of preparedness to intervene emergently and I personally spent this critical care time directly and personally managing the patient.  This critical care time included obtaining a history; examining the patient, pulse oximetry; ordering and review of studies;  arranging urgent treatment with development of a management plan; evaluation of patient's response of treatment; frequent reassessment; and discussions with other providers.  This critical care time was performed to assess and manage the high probability of imminent and life threatening deterioration that could result in multi-organ failure.  It was exclusive of separately billable procedures and treating other patients and teaching time.   Standley Dakins, MD How to contact the [facility] Attending or Consulting provider 7A - 7P or covering provider during after hours 7P -7A, for this patient?  Check the care team in [facility] and look for a) attending/consulting TRH provider listed and b) the [facility] team listed Log into [email or web address] to find provider on call.  Locate the [facility] provider you are looking for under [facility] and page to a number that you can be directly reached. If you still have difficulty reaching the provider, please page the [facility] (Director on Call) for the Hospitalists listed  on amion for assistance.  04/11/2023, 9:43 AM

## 2023-04-11 NOTE — ED Provider Notes (Addendum)
  Provider Note MRN:  [id number]  Arrival date & time: 04/11/23    ED Course and Medical Decision Making  Assumed care of patient at sign-out or upon transfer.  Dyspnea presentation, patient described it as feeling like he needs dialysis.  On my evaluation patient is complaining of central chest pain.  His initial troponin is 178, higher than his baseline in the setting of ESRD.  This raises concern for possible ACS.  Plan is for admission.  1:40 AM update: Second troponin rising dramatically.  Spoke with Dr. Lendell Caprice of cardiology, plan is for admission with [facility] hospitalist service given patient's other comorbidities and need for inpatient dialysis.  .Critical Care  Performed by: Sabas Sous, MD Authorized by: Sabas Sous, MD   Critical care provider statement:    Critical care time (minutes):  75   Critical care was necessary to treat or prevent imminent or life-threatening deterioration of the following conditions: NSTEMI.   Critical care was time spent personally by me on the following activities:  Development of treatment plan with patient or surrogate, discussions with consultants, evaluation of patient's response to treatment, examination of patient, ordering and review of laboratory studies, ordering and review of radiographic studies, ordering and performing treatments and interventions, pulse oximetry, re-evaluation of patient's condition and review of old charts   Final Clinical Impressions(s) / ED Diagnoses     ICD-10-CM   1. Dyspnea, unspecified type  R06.00     2. Chest pain, unspecified type  R07.9     3. NSTEMI (non-ST elevated myocardial infarction) [facility])  I21.4       ED Discharge Orders     None       Discharge Instructions   None     Elmer Sow. Pilar Plate, MD [facility] Emergency Medicine [facility] [email or web address]    Sabas Sous, MD 04/11/23 8469    Sabas Sous, MD 04/11/23 [phone]

## 2023-04-11 NOTE — Assessment & Plan Note (Signed)
-   The patient will be admitted to an observation cardiac telemetry bed. - The patient be placed on IV heparin drip to replace Eliquis.. - Will follow serial troponins and EKGs. - The patient will be placed on aspirin as well as p.r.n. sublingual nitroglycerin and morphine sulfate for pain. - We will obtain a cardiology consult in a.m. for further cardiac risk stratification. - Dr. Lendell Caprice was notified about the patient will need to be called upon patient's arrival to [facility].

## 2023-04-11 NOTE — Assessment & Plan Note (Signed)
-   The patient will need nephrology consult in AM. - We will continue Renagel and Sensipar.

## 2023-04-12 ENCOUNTER — Encounter (HOSPITAL_COMMUNITY): Payer: Self-pay | Admitting: Cardiology

## 2023-04-12 ENCOUNTER — Inpatient Hospital Stay (HOSPITAL_COMMUNITY)
Admission: EM | Disposition: A | Discharge status: Home / Self Care | Payer: Self-pay | Source: Home / Self Care | Attending: Internal Medicine

## 2023-04-12 DIAGNOSIS — I214 Non-ST elevation (NSTEMI) myocardial infarction: Secondary | ICD-10-CM | POA: Diagnosis not present

## 2023-04-12 DIAGNOSIS — I2510 Atherosclerotic heart disease of native coronary artery without angina pectoris: Secondary | ICD-10-CM

## 2023-04-12 DIAGNOSIS — I5042 Chronic combined systolic (congestive) and diastolic (congestive) heart failure: Secondary | ICD-10-CM

## 2023-04-12 HISTORY — PX: CORONARY STENT INTERVENTION: CATH118234

## 2023-04-12 HISTORY — PX: LEFT HEART CATH AND CORONARY ANGIOGRAPHY: CATH118249

## 2023-04-12 LAB — POCT ACTIVATED CLOTTING TIME
Activated Clotting Time: 273 s
Activated Clotting Time: 343 s
Activated Clotting Time: 417 s
Activated Clotting Time: 510 s
Activated Clotting Time: 199 s
Activated Clotting Time: 175 s

## 2023-04-12 LAB — GLUCOSE, CAPILLARY
Glucose-Capillary: 191 mg/dL — ABNORMAL HIGH (ref 70–99)
Glucose-Capillary: 167 mg/dL — ABNORMAL HIGH (ref 70–99)
Glucose-Capillary: 165 mg/dL — ABNORMAL HIGH (ref 70–99)
Glucose-Capillary: 129 mg/dL — ABNORMAL HIGH (ref 70–99)

## 2023-04-12 LAB — LIPOPROTEIN A (LPA): Lipoprotein (a): 13.8 nmol/L (ref <75.0)

## 2023-04-12 LAB — CBC
HCT: 32.2 % — ABNORMAL LOW (ref 39.0–52.0)
Hemoglobin: 10.8 g/dL — ABNORMAL LOW (ref 13.0–17.0)
MCH: 29.9 pg (ref 26.0–34.0)
MCHC: 33.5 g/dL (ref 30.0–36.0)
MCV: 89.2 fL (ref 80.0–100.0)
Platelets: 172 10*3/uL (ref 150–400)
RBC: 3.61 MIL/uL — ABNORMAL LOW (ref 4.22–5.81)
RDW: 14.4 % (ref 11.5–15.5)
WBC: 8.6 10*3/uL (ref 4.0–10.5)
nRBC: 0 % (ref 0.0–0.2)

## 2023-04-12 LAB — BASIC METABOLIC PANEL WITH GFR
Anion gap: 13 (ref 5–15)
BUN: 49 mg/dL — ABNORMAL HIGH (ref 8–23)
CO2: 23 mmol/L (ref 22–32)
Calcium: 8.5 mg/dL — ABNORMAL LOW (ref 8.9–10.3)
Chloride: 95 mmol/L — ABNORMAL LOW (ref 98–111)
Creatinine, Ser: 8.1 mg/dL — ABNORMAL HIGH (ref 0.61–1.24)
GFR, Estimated: 7 mL/min — ABNORMAL LOW (ref >60)
Glucose, Bld: 190 mg/dL — ABNORMAL HIGH (ref 70–99)
Potassium: 3.7 mmol/L (ref 3.5–5.1)
Sodium: 131 mmol/L — ABNORMAL LOW (ref 135–145)

## 2023-04-12 LAB — HEPARIN LEVEL (UNFRACTIONATED): Heparin Unfractionated: >1.1 [IU]/mL — ABNORMAL HIGH (ref 0.30–0.70)

## 2023-04-12 LAB — TROPONIN I (HIGH SENSITIVITY)
Troponin I (High Sensitivity): 12078 ng/L (ref <18)
Troponin I (High Sensitivity): 11346 ng/L (ref <18)

## 2023-04-12 LAB — APTT: aPTT: 71 s — ABNORMAL HIGH (ref 24–36)

## 2023-04-12 SURGERY — LEFT HEART CATH AND CORONARY ANGIOGRAPHY
Anesthesia: LOCAL

## 2023-04-12 MED ORDER — HEPARIN (PORCINE) IN NACL 1000-0.9 UT/500ML-% IV SOLN
INTRAVENOUS | Status: DC | PRN
  Administered 2023-04-12: 500 mL
  Administered 2023-04-12: 1000 mL

## 2023-04-12 MED ORDER — SODIUM CHLORIDE 0.9% FLUSH: 3.0000 mL | INTRAVENOUS | Status: DC | PRN

## 2023-04-12 MED ORDER — SODIUM CHLORIDE 0.9 % IV SOLN: 250.0000 mL | INTRAVENOUS | Status: AC | PRN

## 2023-04-12 MED ORDER — HEPARIN SODIUM (PORCINE) 1000 UNIT/ML IJ SOLN
INTRAMUSCULAR | Status: DC | PRN
  Administered 2023-04-12: 2000 [IU] via INTRAVENOUS
  Administered 2023-04-12: 9000 [IU] via INTRAVENOUS

## 2023-04-12 MED ORDER — MIDAZOLAM HCL 2 MG/2ML IJ SOLN
INTRAMUSCULAR | Status: AC
  Filled 2023-04-12: qty 2

## 2023-04-12 MED ORDER — FENTANYL CITRATE (PF) 100 MCG/2ML IJ SOLN
INTRAMUSCULAR | Status: AC
  Filled 2023-04-12: qty 2

## 2023-04-12 MED ORDER — HEPARIN SODIUM (PORCINE) 1000 UNIT/ML IJ SOLN
INTRAMUSCULAR | Status: AC
  Filled 2023-04-12: qty 10

## 2023-04-12 MED ORDER — MIDAZOLAM HCL 2 MG/2ML IJ SOLN
INTRAMUSCULAR | Status: DC | PRN
  Administered 2023-04-12 (×2): 1 mg via INTRAVENOUS

## 2023-04-12 MED ORDER — FENTANYL CITRATE (PF) 100 MCG/2ML IJ SOLN
INTRAMUSCULAR | Status: DC | PRN
  Administered 2023-04-12 (×2): 25 ug via INTRAVENOUS

## 2023-04-12 MED ORDER — LIDOCAINE HCL (PF) 1 % IJ SOLN
INTRAMUSCULAR | Status: DC | PRN
  Administered 2023-04-12: 5 mL

## 2023-04-12 MED ORDER — ASPIRIN 81 MG PO CHEW
81.0000 mg | CHEWABLE_TABLET | Freq: Every day | ORAL | Status: DC
Start: 2023-04-13 — End: 2023-04-12

## 2023-04-12 MED ORDER — SODIUM CHLORIDE 0.9 % IV SOLN
INTRAVENOUS | Status: AC | PRN
  Administered 2023-04-12: 250 mL via INTRAVENOUS

## 2023-04-12 MED ORDER — CLOPIDOGREL BISULFATE 300 MG PO TABS
ORAL_TABLET | ORAL | Status: DC | PRN
  Administered 2023-04-12: 600 mg via ORAL

## 2023-04-12 MED ORDER — LIDOCAINE HCL (PF) 1 % IJ SOLN
INTRAMUSCULAR | Status: AC
  Filled 2023-04-12: qty 30

## 2023-04-12 MED ORDER — IOHEXOL 350 MG/ML SOLN
INTRAVENOUS | Status: DC | PRN
  Administered 2023-04-12: 160 mL

## 2023-04-12 MED ORDER — CLOPIDOGREL BISULFATE 300 MG PO TABS
ORAL_TABLET | ORAL | Status: AC
  Filled 2023-04-12: qty 2

## 2023-04-12 MED ORDER — SODIUM CHLORIDE 0.9% FLUSH
3.0000 mL | Freq: Two times a day (BID) | INTRAVENOUS | Status: DC
  Administered 2023-04-12 – 2023-04-15 (×6): 3 mL via INTRAVENOUS

## 2023-04-12 MED ORDER — DOPAMINE-DEXTROSE 3.2-5 MG/ML-% IV SOLN
INTRAVENOUS | Status: AC
  Filled 2023-04-12: qty 250

## 2023-04-12 MED ORDER — NITROGLYCERIN 1 MG/10 ML FOR IR/CATH LAB
INTRA_ARTERIAL | Status: AC
  Filled 2023-04-12: qty 10

## 2023-04-12 MED ORDER — HEPARIN SODIUM (PORCINE) 5000 UNIT/ML IJ SOLN
5000.0000 [IU] | Freq: Three times a day (TID) | INTRAMUSCULAR | Status: DC
  Administered 2023-04-13: 5000 [IU] via SUBCUTANEOUS
  Filled 2023-04-12: qty 1

## 2023-04-12 MED ORDER — CLOPIDOGREL BISULFATE 75 MG PO TABS
75.0000 mg | ORAL_TABLET | Freq: Every day | ORAL | Status: DC
  Administered 2023-04-13 – 2023-04-15 (×3): 75 mg via ORAL
  Filled 2023-04-12 (×3): qty 1

## 2023-04-12 MED ORDER — DOPAMINE-DEXTROSE 3.2-5 MG/ML-% IV SOLN
INTRAVENOUS | Status: DC | PRN
  Administered 2023-04-12: 5 ug/kg/min via INTRAVENOUS

## 2023-04-12 SURGICAL SUPPLY — 30 items
BALLN SCOREFLEX 2.50X10 (BALLOONS) ×1 IMPLANT
BALLN ~~LOC~~ EMERGE MR 2.5X12 (BALLOONS) ×1 IMPLANT
BALLN ~~LOC~~ EMERGE MR 2.5X15 (BALLOONS) ×1 IMPLANT
BALLN ~~LOC~~ EMERGE MR 2.5X6 (BALLOONS) ×1 IMPLANT
BALLOON SCOREFLEX 2.50X10 (BALLOONS) IMPLANT
BALLOON ~~LOC~~ EMERGE MR 2.5X12 (BALLOONS) IMPLANT
BALLOON ~~LOC~~ EMERGE MR 2.5X15 (BALLOONS) IMPLANT
BALLOON ~~LOC~~ EMERGE MR 2.5X6 (BALLOONS) IMPLANT
CATH INFINITI 5FR MULTPACK ANG (CATHETERS) IMPLANT
CATH INFINITI 6F ANG MULTIPACK (CATHETERS) IMPLANT
CATH LAUNCHER 6FR EBU3.5 (CATHETERS) IMPLANT
CATH SUPERCROSS ANGLED 90 DEG (MICROCATHETER) IMPLANT
ELECT DEFIB PAD ADLT CADENCE (PAD) IMPLANT
KIT ENCORE 26 ADVANTAGE (KITS) IMPLANT
PACK CARDIAC CATHETERIZATION (CUSTOM PROCEDURE TRAY) ×2 IMPLANT
SET ATX-X65L (MISCELLANEOUS) IMPLANT
SHEATH PINNACLE 5F 10CM (SHEATH) IMPLANT
SHEATH PINNACLE 6F 10CM (SHEATH) IMPLANT
SHEATH PROBE COVER 6X72 (BAG) IMPLANT
STENT SYNERGY XD 2.50X12 (Permanent Stent) IMPLANT
STENT SYNERGY XD 2.50X32 (Permanent Stent) IMPLANT
SYNERGY XD 2.50X12 (Permanent Stent) ×1 IMPLANT
SYNERGY XD 2.50X32 (Permanent Stent) ×1 IMPLANT
TUBING CIL FLEX 10 FLL-RA (TUBING) IMPLANT
WIRE ASAHI PROWATER 180CM (WIRE) IMPLANT
WIRE ASAHI PROWATER 300CM (WIRE) IMPLANT
WIRE EMERALD 3MM-J .035X150CM (WIRE) IMPLANT
WIRE MICRO SET SILHO 5FR 7 (SHEATH) IMPLANT
WIRE PT2 MS 185 (WIRE) IMPLANT
WIRE RUNTHROUGH IZANAI 014 180 (WIRE) IMPLANT

## 2023-04-12 NOTE — Interval H&P Note (Signed)
 History and Physical Interval Note:  04/12/2023 8:45 AM  Anthony Bullock  has presented today for surgery, with the diagnosis of nstemi.  The various methods of treatment have been discussed with the patient and family. After consideration of risks, benefits and other options for treatment, the patient has consented to  Procedure(s): LEFT HEART CATH AND CORONARY ANGIOGRAPHY (N/A) as a surgical intervention.  The patient's history has been reviewed, patient examined, no change in status, stable for surgery.  I have reviewed the patient's chart and labs.  Questions were answered to the patient's satisfaction.    Cath Lab Visit (complete for each Cath Lab visit)  Clinical Evaluation Leading to the Procedure:   ACS: Yes.    Non-ACS:    Anginal Classification: CCS IV  Anti-ischemic medical therapy: No Therapy  Non-Invasive Test Results: No non-invasive testing performed  Prior CABG: No previous CABG       Theron Arista [facility] 04/12/2023 8:46 AM

## 2023-04-12 NOTE — Progress Notes (Signed)
 Site area: right groin arterial sheath Site Prior to Removal:  Level 0 Pressure Applied For: 30 minutes Manual:   yes Patient Status During Pull:  stable Post Pull Site:  Level 0 Post Pull Instructions Given:  yes Post Pull Pulses Present: rt BKA; rt popliteal palpable Dressing Applied:  gauze and tegadrrm Bedrest begins @ 1345 Comments:

## 2023-04-12 NOTE — TOC Initial Note (Signed)
 Transition of Care [facility]) - Initial/Assessment Note    Patient Details  Name: Anthony Bullock MRN: [id number] Date of Birth: 1960-09-28  Transition of Care [facility] CM/SW Contact:    Gala Lewandowsky, RN Phone Number: 04/12/2023, 3:16 PM  Clinical Narrative: Patient presented for shortness of breath. PTA patient was from  home with support of spouse. Spouse was at the bedside during the visit. Patient has hx ESRD-TTS HD schedule and spouse transports to treatments. Patient has DME bilateral prosthesis, wheelchair, and cane. Case Manager will continue to follow for transition of care needs as the patient progresses.                  Expected Discharge Plan: Home/Self Care Barriers to Discharge: Continued Medical Work up   Patient Goals and CMS Choice Patient states their goals for this hospitalization and ongoing recovery are:: plan to return home   Expected Discharge Plan and Services   Discharge Planning Services: CM Consult   Living arrangements for the past 2 months: Single Family Home  Prior Living Arrangements/Services Living arrangements for the past 2 months: Single Family Home Lives with:: Spouse Patient language and need for interpreter reviewed:: Yes Do you feel safe going back to the place where you live?: Yes      Need for Family Participation in Patient Care: Yes (Comment) Care giver support system in place?: Yes (comment)   Criminal Activity/Legal Involvement Pertinent to Current Situation/Hospitalization: No - Comment as needed  Activities of Daily Living   ADL Screening (condition at time of admission) Independently performs ADLs?: Yes (appropriate for developmental age) Is the patient deaf or have difficulty hearing?: No Does the patient have difficulty seeing, even when wearing glasses/contacts?: No Does the patient have difficulty concentrating, remembering, or making decisions?: No  Permission Sought/Granted Permission sought to share information  with : Family Supports, Case Manager    Emotional Assessment Appearance:: Appears stated age       Alcohol / Substance Use: Not Applicable Psych Involvement: No (comment)  Admission diagnosis:  NSTEMI (non-ST elevated myocardial infarction) (HCC) [I21.4] Dyspnea, unspecified type [R06.00] Chest pain, unspecified type [R07.9] Patient Active Problem List   Diagnosis Date Noted   NSTEMI (non-ST elevated myocardial infarction) (HCC) 04/11/2023   Paroxysmal atrial fibrillation (HCC) 04/11/2023   Type 2 diabetes mellitus with peripheral neuropathy (HCC) 04/11/2023   Dyslipidemia 04/11/2023   GERD without esophagitis 04/11/2023   End-stage renal disease on hemodialysis (HCC) 04/11/2023   Chest pain 03/07/2023   Volume overload 09/24/2022   Pneumonia due to COVID-19 virus 09/06/2022   Acute on chronic HFrEF (heart failure with reduced ejection fraction) (HCC) 09/06/2022   Atrial fibrillation (HCC) 08/23/2022   Pneumonia due to infectious organism 08/18/2022   Stenosis of intracranial portion of both internal carotid arteries 08/09/2022   Diplopia 08/09/2022   Left carotid stenosis 08/09/2022   Malnutrition of moderate degree 11/18/2019   Below-knee amputation of left lower extremity (HCC) 11/16/2019   Abscess of left foot 11/13/2019   Dehiscence of amputation stump (HCC)    History of complete ray amputation of fifth toe of left foot (HCC) 10/09/2019   Acute pulmonary edema (HCC)    Thrombocytopenia (HCC) 08/17/2019   Pressure injury of back, stage 2 (HCC) 08/17/2019   Hypertensive emergency 07/10/2019   Onychomycosis 08/16/2016   Osteopathy in diseases classified elsewhere, unspecified site 05/14/2016   S/P bilateral below knee amputation (HCC) 04/26/2016   Bilateral carotid bruits 03/30/2016   Diabetes mellitus with complication (HCC)  Anemia due to chronic kidney disease    ESRD on dialysis (HCC) 11/30/2015   Acute respiratory failure with hypoxia (HCC) 11/29/2015    Dependence on renal dialysis (HCC) 08/15/2015   Presence of automatic (implantable) cardiac defibrillator 03/12/2015   At high risk for falls 09/29/2014   Peripheral vascular disease (HCC) 09/29/2014   Osteoarthritis of both knees 01/07/2014   Osteoarthritis of multiple joints 10/06/2013   NICM (nonischemic cardiomyopathy) (HCC) 06/27/2013   CAD (coronary artery disease) 03/13/2013   Abnormal nuclear stress test 01/27/2013   Chronic systolic congestive heart failure (HCC) 01/27/2013   COPD (chronic obstructive pulmonary disease) (HCC) 12/27/2012   Diabetic peripheral neuropathy (HCC) 02/10/2012   Urethral stricture 10/06/2010   Adjustment disorder with depressed mood 08/21/2009   Type 2 diabetes mellitus with hyperlipidemia (HCC) 04/14/2008   Hyperlipidemia 04/14/2008   Essential hypertension 04/14/2008   Allergic rhinitis 04/14/2008   PCP:  Kristian Covey, MD Pharmacy:   [facility] [address] [place] - [address] [place] [address] Phone: [phone] Fax: [phone]  [facility] [id number], [place] - [address] [place] [address] Phone: [phone] Fax: [phone]  [facility] [address] [place] [address] Phone: [phone] Fax: [phone]  Social Drivers of Health (SDOH) Social History: SDOH Screenings   Food Insecurity: No Food Insecurity (04/11/2023)  Housing: Low Risk  (04/11/2023)  Transportation Needs: No Transportation Needs (04/11/2023)  Utilities: Not At Risk (04/11/2023)  Alcohol Screen: Low Risk  (02/03/2023)  Depression (PHQ2-9): Low Risk  (02/03/2023)  Financial Resource Strain: Low Risk  (02/03/2023)  Physical Activity: Inactive (02/03/2023)  Social Connections: Socially Integrated (03/07/2023)  Stress: No Stress Concern Present (02/03/2023)  Tobacco Use: Medium Risk (04/10/2023)  Health Literacy: Adequate Health Literacy (02/03/2023)    SDOH Interventions:     Readmission Risk Interventions    04/12/2023    3:14 PM 03/08/2023   10:44 AM  Readmission Risk Prevention Plan  Transportation Screening Complete Complete  PCP or Specialist Appt within 3-5 Days  Complete  HRI or Home Care Consult  Complete  Social Work Consult for Recovery Care Planning/Counseling  Complete  Palliative Care Screening  Not Applicable  Medication Review Oceanographer) Referral to Pharmacy Complete  HRI or Home Care Consult Complete   SW Recovery Care/Counseling Consult Complete   Palliative Care Screening Not Applicable   Skilled Nursing Facility Not Applicable

## 2023-04-12 NOTE — Progress Notes (Addendum)
   Patient Name: Anthony Bullock Date of Encounter: 04/12/2023 [facility] Cardiologist: Dina Rich, MD   Interval Summary  .    Patient feeling well this morning. No recurrent chest pain.   Vital Signs .    Vitals:   04/11/23 2353 04/12/23 0000 04/12/23 0410 04/12/23 0418  BP:  117/83 (!) 98/29 (!) 110/43  Pulse: 91 90 88 88  Resp: 20  18   Temp:   98.6 F (37 C)   TempSrc:   Oral   SpO2: 98% 100% 100% 98%  Weight:      Height:        Intake/Output Summary (Last 24 hours) at 04/12/2023 0749 Last data filed at 04/12/2023 0348 Gross per 24 hour  Intake 223.2 ml  Output 2000 ml  Net -1776.8 ml      04/11/2023    7:36 PM 04/11/2023    5:40 PM 04/11/2023    3:21 PM  Last 3 Weights  Weight (lbs) 189 lb 9.5 oz 202 lb 13.2 oz 207 lb 3.7 oz  Weight (kg) 86 kg 92 kg 94 kg      Telemetry/ECG    Sinus rhythm - Personally Reviewed  Physical Exam .   GEN: No acute distress.   Neck: No JVD Cardiac: RRR, no murmurs, rubs, or gallops.  Respiratory: Clear to auscultation bilaterally. GI: Soft, nontender, non-distended  MS: Bilateral BKA. No upper leg edema  Assessment & Plan .     NSTEMI Patient presented to the [facility] ED with substernal chest pain, 8/10. Has had this type of chest pain before. hsTN 176 >>2147>>9,544 >>15,002. Admission ECG with sinus rhythm, chronic LBBB. Last Eliquis dose 03/31, now on heparin. Plan for [facility] today with Dr. [place].  Continue ASA, high dose statin, Coreg 12.5mg  BID  Chronic HFmrEF Patient with LVEF 40% on 11/29/22 TTE. Repeat US this admission with stable LVEF 40% with global hypokinesis.  GDMT limited by ESRD status. Continue Coreg 12.5mg  Volume management by nephrology/dialysis  Paroxysmal atrial fibrillation Sinus rhythm on ECG this admission. Ventricular rates remain well controlled.  Continue Coreg 12.5mg  BID Continue Heparin bridging pending LHC today. Resume Eliquis per interventionalist direction.  Dyslipidemia LDL 7,  triglycerides 203. Continue Atorvastatin 80mg .   Fenofibrate stopped given renal dysfunction. Could consider Vascepa for triglyceride management.  Hypertension Patient with somewhat labile BP this admission. Typically requires Midodrine to support BP for dialysis.  Continue Coreg 12.5mg  BID. If low BP becomes a bigger issue, consider transitioning to Toprol XL.   Per primary team: DM type II ESRD GERD For questions or updates, please contact [facility] Please consult [email or web address] for contact info under        Signed, Perlie Gold, PA-C

## 2023-04-12 NOTE — Plan of Care (Signed)

## 2023-04-12 NOTE — Progress Notes (Signed)
 PROGRESS NOTE   Anthony SPEAKMAN  ZOX:[id number]    DOB: 06/06/1960    DOA: 04/10/2023  PCP: Kristian Covey, MD   I have briefly reviewed patients previous medical records in [facility].  Chief Complaint  Patient presents with   Shortness of Breath    Brief Hospital Course:   63 year old male with type 2 diabetes mellitus with diabetic nephropathy, neuropathy and vascular complications, ESRD on hemodialysis TTS, hypertension, PVD status post bilateral BKA, dyslipidemia, urolithiasis, NICM missed HD last Sat presented to ED with shortness of breath and chest pain.  His troponins peaked to around 16K, cardiology consulted, diagnosed him with NSTEMI, started on IV heparin drip and transferred to [facility] for cardiac cath.  Nephrology consulted and he underwent HD at [facility] on 4/1 prior to transfer.  S/p cardiac cath 4/2 with stenting of LAD and first diagonal.  Plan for staged PCI on 4/4 after dialysis on 4/3.   Assessment & Plan:  Principal Problem:   NSTEMI (non-ST elevated myocardial infarction) (HCC) Active Problems:   Type 2 diabetes mellitus with peripheral neuropathy (HCC)   Paroxysmal atrial fibrillation (HCC)   End-stage renal disease on hemodialysis (HCC)   Dyslipidemia   GERD without esophagitis   NSTEMI/severe three-vessel obstructive CAD His troponins peaked to around 16K,  Cardiology consulted, diagnosed him with NSTEMI, started on IV heparin drip and transferred to [facility] for cardiac cath.   Nephrology consulted and he underwent HD at [facility] on 4/1 prior to transfer.   S/p cardiac cath 4/2 with stenting of LAD and first diagonal.  Plan for staged PCI on 4/4 after dialysis on 4/3. As per cardiology, recommend triple therapy x 1 month then continue apixaban and Plavix for 12 months, continue atorvastatin. Echo shows ICM with LVEF of 40%.  ESRD on hemodialysis S/p HD at [facility] on 4/1 prior to transfer and cardiac cath today Requested nephrology  consultation for HD tomorrow.  Discussed with Dr. Ronalee Belts.  Anemia of ESRD Stable.  Hyponatremia Stable in the low 130s.  Dyslipidemia - continue statin therapy as well as fenofibrate and LDL notably 7 on lipid panel   Type 2 diabetes mellitus with peripheral neuropathy  - The patient will be placed on supplemental coverage with NovoLog. - continue basal coverage. - continue Neurontin. -Mildly uncontrolled.  Adjust insulins as needed.   Paroxysmal atrial fibrillation  - Post cath off of IV heparin drip per cardiology. - continue beta-blocker therapy for now pending cardiology eval  Body mass index is 27.2 kg/m.   DVT prophylaxis: heparin injection 5,000 Units Start: 04/13/23 0600     Code Status: Full Code:  Family Communication: None at bedside Disposition:  Status is: Inpatient Remains inpatient appropriate because: Staged PCI planned for 4/4     Consultants:   Cardiology  Procedures:   Cardiac cath with stent 4/2 HD at [facility] 4/1  Antimicrobials:      Subjective:  Was unable to see patient this morning because he was already in the Cath Lab.  Saw him this afternoon.  Denies complaints.  Specifically denies chest pain or dyspnea.  Objective:   Vitals:   04/12/23 1510 04/12/23 1518 04/12/23 1540 04/12/23 1617  BP: (!) 129/56 (!) 126/53 (!) 124/53 (!) 120/48  Pulse: 80 81 84 80  Resp:    12  Temp:    98.2 F (36.8 C)  TempSrc:    Oral  SpO2: 100% 100% 99% 100%  Weight:  Height:        General exam: Middle-age male, moderately built and nourished lying comfortably propped up in bed without distress. Respiratory system: Clear to auscultation. Respiratory effort normal. Cardiovascular system: S1 & S2 heard, RRR. No JVD, murmurs, rubs, gallops or clicks. No pedal edema.  Telemetry personally reviewed this morning: Sinus rhythm. Gastrointestinal system: Abdomen is nondistended, soft and nontender. No organomegaly or masses felt. Normal  bowel sounds heard. Central nervous system: Alert and oriented. No focal neurological deficits. Extremities: Symmetric 5 x 5 power. Skin: No rashes, lesions or ulcers.  Right groin cath site without acute findings. Psychiatry: Judgement and insight appear normal. Mood & affect appropriate.     Data Reviewed:   I have personally reviewed following labs and imaging studies   CBC: Recent Labs  Lab 04/10/23 2210 04/11/23 0532 04/12/23 0656  WBC 11.5* 10.3 8.6  NEUTROABS 8.6*  --   --   HGB 12.2* 10.6* 10.8*  HCT 36.5* 32.5* 32.2*  MCV 88.6 90.3 89.2  PLT 218 187 172    Basic Metabolic Panel: Recent Labs  Lab 04/10/23 2210 04/11/23 0505 04/12/23 0656  NA 130* 129* 131*  K 4.0 3.8 3.7  CL 92* 95* 95*  CO2 20* 19* 23  GLUCOSE 138* 120* 190*  BUN 78* 83* 49*  CREATININE 10.44* 10.82* 8.10*  CALCIUM 8.7* 8.4* 8.5*    Liver Function Tests: Recent Labs  Lab 04/10/23 2210  AST 37  ALT 25  ALKPHOS 114  BILITOT 0.8  PROT 7.5  ALBUMIN 3.5    CBG: Recent Labs  Lab 04/12/23 0408 04/12/23 0754 04/12/23 1615  GLUCAP 191* 167* 165*    Microbiology Studies:  No results found for this or any previous visit (from the past 240 hours).  Radiology Studies:  CARDIAC CATHETERIZATION Result Date: 04/12/2023   Ost RCA lesion is 80% stenosed.   Prox RCA to Mid RCA lesion is 80% stenosed.   Ost LM lesion is 45% stenosed.   Prox LAD to Mid LAD lesion is 85% stenosed.   1st Diag lesion is 95% stenosed.   1st Mrg lesion is 100% stenosed.   Dist Cx-1 lesion is 85% stenosed.   Dist Cx-2 lesion is 100% stenosed.   A drug-eluting stent was successfully placed using a SYNERGY XD 2.50X12 in the side branch.   A drug-eluting stent was successfully placed using a SYNERGY XD 2.50X32 in the main branch.   Angioplasty was performed in the main branch. .   Angioplasty was performed in the side branch. .   Post intervention, there is a 0% residual stenosis.   Post intervention, there is a 0%  residual stenosis.   LV end diastolic pressure is normal.   Recommend to resume Apixaban, at currently prescribed dose and frequency.   Recommend concurrent antiplatelet therapy of Aspirin 81 mg for 1 month and Clopidogrel 75mg  daily for 12 months . Severe 3 vessel obstructive CAD. Suspect culprit vessel is the first OM which is occluded Normal LVEDP Successful bifurcation stenting of the LAD/first diagonal with 2.5 x 12 mm Synergy stent in the diagonal and 2.5 x 32 mm Synergy stent in the LAD Plan: DAPT with ASA 81 mg daily for one month. Plavix 75 mg daily for 12 months for ACS. Anticipate resuming Eliquis once procedures are completed. Recommend return for staged PCI of the RCA at the ostium and mid vessel. Would treat LCx disease medically.   ECHOCARDIOGRAM COMPLETE Result Date: 04/11/2023    ECHOCARDIOGRAM REPORT  Patient Name:   BLAYN Bullock [facility] Date of Exam: 04/11/2023 Medical Rec #:  [id number]     Height:       70.0 in Accession #:    [id number]    Weight:       207.2 lb Date of Birth:  25-Aug-1960     BSA:          2.119 m Patient Age:    62 years      BP:           124/41 mmHg Patient Gender: M             HR:           71 bpm. [facility] Procedure: 2D Echo, Cardiac Doppler, Color Doppler and Intracardiac            Opacification Agent (Both Spectral and Color Flow Doppler were            utilized during procedure). Indications:    NSTEMI l21.4  History:        Patient has prior history of Echocardiogram examinations, most                 recent 11/29/2022. CHF and Cardiomyopathy, CAD, COPD; Risk                 Factors:Hypertension, Diabetes, Dyslipidemia and Former Smoker.                 ESRD on dailysis.  Sonographer:    Celesta Gentile RCS Referring Phys: [id number] JAN A MANSY  Sonographer Comments: Suboptimal apical window. IMPRESSIONS  1. Left ventricular ejection fraction, by estimation, is 40%. The left ventricle has mildly decreased function. The left ventricle demonstrates global  hypokinesis. There is moderate left ventricular hypertrophy. Left ventricular diastolic parameters are  consistent with Grade I diastolic dysfunction (impaired relaxation). Elevated left atrial pressure.  2. Right ventricular systolic function is normal. The right ventricular size is normal.  3. The mitral valve is normal in structure. No evidence of mitral valve regurgitation. No evidence of mitral stenosis.  4. The aortic valve is tricuspid. Aortic valve regurgitation is not visualized. No aortic stenosis is present.  5. The inferior vena cava is normal in size with greater than 50% respiratory variability, suggesting right atrial pressure of 3 mmHg. FINDINGS  Left Ventricle: Left ventricular ejection fraction, by estimation, is 40%. The left ventricle has mildly decreased function. The left ventricle demonstrates global hypokinesis. Definity contrast agent was given IV to delineate the left ventricular endocardial borders. The left ventricular internal cavity size was normal in size. There is moderate left ventricular hypertrophy. Left ventricular diastolic parameters are consistent with Grade I diastolic dysfunction (impaired relaxation). Elevated left atrial pressure. Right Ventricle: The right ventricular size is normal. Right vetricular wall thickness was not well visualized. Right ventricular systolic function is normal. Left Atrium: Left atrial size was normal in size. Right Atrium: Right atrial size was normal in size. Pericardium: There is no evidence of pericardial effusion. Mitral Valve: The mitral valve is normal in structure. No evidence of mitral valve regurgitation. No evidence of mitral valve stenosis. Tricuspid Valve: The tricuspid valve is normal in structure. Tricuspid valve regurgitation is not demonstrated. No evidence of tricuspid stenosis. Aortic Valve: The aortic valve is tricuspid. Aortic valve regurgitation is not visualized. No aortic stenosis is present. Aortic valve mean gradient  measures 2.0 mmHg. Aortic valve peak gradient measures 3.7 mmHg. Aortic valve area, by VTI measures  2.11 cm. Pulmonic Valve: The pulmonic valve was not well visualized. Pulmonic valve regurgitation is not visualized. No evidence of pulmonic stenosis. Aorta: The aortic root is normal in size and structure. Venous: The inferior vena cava is normal in size with greater than 50% respiratory variability, suggesting right atrial pressure of 3 mmHg. IAS/Shunts: No atrial level shunt detected by color flow Doppler.  LEFT VENTRICLE PLAX 2D LVIDd:         5.50 cm   Diastology LVIDs:         4.40 cm   LV e' medial:    2.83 cm/s LV PW:         1.30 cm   LV E/e' medial:  24.4 LV IVS:        1.30 cm   LV e' lateral:   5.33 cm/s LVOT diam:     2.10 cm   LV E/e' lateral: 12.9 LV SV:         55 LV SV Index:   26 LVOT Area:     3.46 cm  RIGHT VENTRICLE RV S prime:     10.70 cm/s TAPSE (M-mode): 2.1 cm LEFT ATRIUM           Index        RIGHT ATRIUM           Index LA diam:      3.80 cm 1.79 cm/m   RA Area:     14.20 cm LA Vol (A2C): 67.0 ml 31.62 ml/m  RA Volume:   35.00 ml  16.52 ml/m LA Vol (A4C): 67.8 ml 32.00 ml/m  AORTIC VALVE AV Area (Vmax):    2.10 cm AV Area (Vmean):   1.83 cm AV Area (VTI):     2.11 cm AV Vmax:           96.78 cm/s AV Vmean:          67.540 cm/s AV VTI:            0.261 m AV Peak Grad:      3.7 mmHg AV Mean Grad:      2.0 mmHg LVOT Vmax:         58.70 cm/s LVOT Vmean:        35.700 cm/s LVOT VTI:          0.159 m LVOT/AV VTI ratio: 0.61  AORTA Ao Root diam: 3.70 cm MITRAL VALVE MV Area (PHT): 4.89 cm     SHUNTS MV Decel Time: 155 msec     Systemic VTI:  0.16 m MV E velocity: 69.00 cm/s   Systemic Diam: 2.10 cm MV A velocity: 103.00 cm/s MV E/A ratio:  0.67 Dina Rich MD Electronically signed by Dina Rich MD Signature Date/Time: 04/11/2023/12:56:21 PM    Final    DG Chest Portable 1 View Result Date: 04/10/2023 CLINICAL DATA:  Short of breath, COPD EXAM: PORTABLE CHEST 1 VIEW  COMPARISON:  03/06/2023 FINDINGS: Single frontal view of the chest demonstrates stable right internal jugular dialysis catheter and single lead defibrillator. Cardiac silhouette is stable. No acute airspace disease, effusion, or pneumothorax. No acute bony abnormalities. IMPRESSION: 1. Stable chest, no acute process. Electronically Signed   By: Sharlet Salina M.D.   On: 04/10/2023 21:52    Scheduled Meds:    aspirin EC  81 mg Oral Daily   atorvastatin  80 mg Oral QHS   carvedilol  12.5 mg Oral BID   Chlorhexidine Gluconate Cloth  6 each Topical Q0600  cinacalcet  30 mg Oral Q T,Th,Sa-HD   [START ON 04/13/2023] clopidogrel  75 mg Oral Q breakfast   furosemide  40 mg Intravenous Once   gabapentin  600 mg Oral BID   [START ON 04/13/2023] heparin  5,000 Units Subcutaneous Q8H   insulin aspart  0-15 Units Subcutaneous Q4H   insulin glargine-yfgn  12 Units Subcutaneous QHS   midodrine  10 mg Oral Once per day on Monday Wednesday Friday   multivitamin  1 tablet Oral Daily   nitroGLYCERIN       pantoprazole  40 mg Oral Daily   sevelamer carbonate  1,600 mg Oral TID WC   sodium chloride flush  3 mL Intravenous Q12H    Continuous Infusions:    sodium chloride       LOS: 1 day     Marcellus Scott, MD,  FACP, [facility], [facility], [facility]   [facility] [place]      To contact the attending provider between 7A-7P or the covering provider during after hours 7P-7A, please log into the web site [email or web address] and access using universal [place] password for that web site. If you do not have the password, please call the hospital operator.  04/12/2023, 5:58 PM

## 2023-04-13 DIAGNOSIS — I214 Non-ST elevation (NSTEMI) myocardial infarction: Secondary | ICD-10-CM | POA: Diagnosis not present

## 2023-04-13 DIAGNOSIS — Z992 Dependence on renal dialysis: Secondary | ICD-10-CM | POA: Diagnosis not present

## 2023-04-13 DIAGNOSIS — N186 End stage renal disease: Secondary | ICD-10-CM | POA: Diagnosis not present

## 2023-04-13 LAB — BASIC METABOLIC PANEL WITH GFR
Anion gap: 17 — ABNORMAL HIGH (ref 5–15)
BUN: 63 mg/dL — ABNORMAL HIGH (ref 8–23)
CO2: 21 mmol/L — ABNORMAL LOW (ref 22–32)
Calcium: 9 mg/dL (ref 8.9–10.3)
Chloride: 94 mmol/L — ABNORMAL LOW (ref 98–111)
Creatinine, Ser: 10.29 mg/dL — ABNORMAL HIGH (ref 0.61–1.24)
GFR, Estimated: 5 mL/min — ABNORMAL LOW (ref >60)
Glucose, Bld: 109 mg/dL — ABNORMAL HIGH (ref 70–99)
Potassium: 4.1 mmol/L (ref 3.5–5.1)
Sodium: 132 mmol/L — ABNORMAL LOW (ref 135–145)

## 2023-04-13 LAB — APTT: aPTT: 55 s — ABNORMAL HIGH (ref 24–36)

## 2023-04-13 LAB — RENAL FUNCTION PANEL
Albumin: 2.6 g/dL — ABNORMAL LOW (ref 3.5–5.0)
Anion gap: 17 — ABNORMAL HIGH (ref 5–15)
BUN: 63 mg/dL — ABNORMAL HIGH (ref 8–23)
CO2: 19 mmol/L — ABNORMAL LOW (ref 22–32)
Calcium: 9 mg/dL (ref 8.9–10.3)
Chloride: 93 mmol/L — ABNORMAL LOW (ref 98–111)
Creatinine, Ser: 10.9 mg/dL — ABNORMAL HIGH (ref 0.61–1.24)
GFR, Estimated: 5 mL/min — ABNORMAL LOW (ref >60)
Glucose, Bld: 202 mg/dL — ABNORMAL HIGH (ref 70–99)
Phosphorus: 7.4 mg/dL — ABNORMAL HIGH (ref 2.5–4.6)
Potassium: 3.9 mmol/L (ref 3.5–5.1)
Sodium: 129 mmol/L — ABNORMAL LOW (ref 135–145)

## 2023-04-13 LAB — CBC
HCT: 31 % — ABNORMAL LOW (ref 39.0–52.0)
HCT: 31.8 % — ABNORMAL LOW (ref 39.0–52.0)
Hemoglobin: 10.4 g/dL — ABNORMAL LOW (ref 13.0–17.0)
Hemoglobin: 10.7 g/dL — ABNORMAL LOW (ref 13.0–17.0)
MCH: 29.9 pg (ref 26.0–34.0)
MCH: 30.1 pg (ref 26.0–34.0)
MCHC: 33.5 g/dL (ref 30.0–36.0)
MCHC: 33.6 g/dL (ref 30.0–36.0)
MCV: 89.1 fL (ref 80.0–100.0)
MCV: 89.3 fL (ref 80.0–100.0)
Platelets: 188 10*3/uL (ref 150–400)
Platelets: 185 10*3/uL (ref 150–400)
RBC: 3.48 MIL/uL — ABNORMAL LOW (ref 4.22–5.81)
RBC: 3.56 MIL/uL — ABNORMAL LOW (ref 4.22–5.81)
RDW: 14.6 % (ref 11.5–15.5)
RDW: 14.6 % (ref 11.5–15.5)
WBC: 12.3 10*3/uL — ABNORMAL HIGH (ref 4.0–10.5)
WBC: 11.5 10*3/uL — ABNORMAL HIGH (ref 4.0–10.5)
nRBC: 0 % (ref 0.0–0.2)
nRBC: 0 % (ref 0.0–0.2)

## 2023-04-13 LAB — HEPATITIS B SURFACE ANTIBODY, QUANTITATIVE: Hep B S AB Quant (Post): <3.5 m[IU]/mL — ABNORMAL LOW

## 2023-04-13 LAB — HEPARIN LEVEL (UNFRACTIONATED): Heparin Unfractionated: 0.47 [IU]/mL (ref 0.30–0.70)

## 2023-04-13 LAB — GLUCOSE, CAPILLARY
Glucose-Capillary: 100 mg/dL — ABNORMAL HIGH (ref 70–99)
Glucose-Capillary: 197 mg/dL — ABNORMAL HIGH (ref 70–99)
Glucose-Capillary: 86 mg/dL (ref 70–99)
Glucose-Capillary: 92 mg/dL (ref 70–99)
Glucose-Capillary: 93 mg/dL (ref 70–99)

## 2023-04-13 MED ORDER — LIDOCAINE-PRILOCAINE 2.5-2.5 % EX CREA: 1.0000 | TOPICAL_CREAM | CUTANEOUS | Status: DC | PRN

## 2023-04-13 MED ORDER — LIDOCAINE HCL (PF) 1 % IJ SOLN: 5.0000 mL | INTRAMUSCULAR | Status: DC | PRN

## 2023-04-13 MED ORDER — ANTICOAGULANT SODIUM CITRATE 4% (200MG/5ML) IV SOLN: 5.0000 mL | Status: DC | PRN

## 2023-04-13 MED ORDER — MIDODRINE HCL 5 MG PO TABS
10.0000 mg | ORAL_TABLET | ORAL | Status: DC
  Administered 2023-04-13 – 2023-04-15 (×2): 10 mg via ORAL
  Filled 2023-04-13 (×2): qty 2

## 2023-04-13 MED ORDER — GABAPENTIN 300 MG PO CAPS
300.0000 mg | ORAL_CAPSULE | Freq: Every day | ORAL | Status: DC
  Administered 2023-04-14: 300 mg via ORAL
  Filled 2023-04-13 (×2): qty 1

## 2023-04-13 MED ORDER — HEPARIN SODIUM (PORCINE) 1000 UNIT/ML DIALYSIS: 1000.0000 [IU] | INTRAMUSCULAR | Status: DC | PRN

## 2023-04-13 MED ORDER — PENTAFLUOROPROP-TETRAFLUOROETH EX AERO
1.0000 | INHALATION_SPRAY | CUTANEOUS | Status: DC | PRN
Start: 2023-04-13 — End: 2023-04-13

## 2023-04-13 MED ORDER — CHLORHEXIDINE GLUCONATE CLOTH 2 % EX PADS
6.0000 | MEDICATED_PAD | Freq: Every day | CUTANEOUS | Status: DC
  Administered 2023-04-14 – 2023-04-15 (×2): 6 via TOPICAL

## 2023-04-13 MED ORDER — ANTICOAGULANT SODIUM CITRATE 4% (200MG/5ML) IV SOLN
5.0000 mL | Status: DC | PRN
Start: 2023-04-13 — End: 2023-04-13

## 2023-04-13 MED ORDER — ALTEPLASE 2 MG IJ SOLR: 2.0000 mg | Freq: Once | INTRAMUSCULAR | Status: DC | PRN

## 2023-04-13 MED ORDER — HEPARIN (PORCINE) 25000 UT/250ML-% IV SOLN
1150.0000 [IU]/h | INTRAVENOUS | Status: DC
  Administered 2023-04-13: 1100 [IU]/h via INTRAVENOUS
  Administered 2023-04-14: 1150 [IU]/h via INTRAVENOUS
  Filled 2023-04-13 (×2): qty 250

## 2023-04-13 MED ORDER — ALTEPLASE 2 MG IJ SOLR
2.0000 mg | Freq: Once | INTRAMUSCULAR | Status: DC | PRN
Start: 2023-04-13 — End: 2023-04-13

## 2023-04-13 MED ORDER — PENTAFLUOROPROP-TETRAFLUOROETH EX AERO: 1.0000 | INHALATION_SPRAY | CUTANEOUS | Status: DC | PRN

## 2023-04-13 MED FILL — Nitroglycerin IV Soln 100 MCG/ML in D5W: INTRA_ARTERIAL | Qty: 10 | Status: AC

## 2023-04-13 NOTE — Progress Notes (Signed)
 [facility] Progress Note  Subjective:  Seen in room In good spirits Another stenting planned for tomorrow States usual UF is not more than 2 L   Vitals:   04/12/23 2243 04/13/23 0315 04/13/23 0800 04/13/23 0959  BP:  (!) 114/50 (!) 122/50 (!) 106/43  Pulse: 83 75 85 78  Resp: 14 14    Temp:  98.2 F (36.8 C) 98.8 F (37.1 C)   TempSrc:  Oral Oral   SpO2: 99% 99% 93%   Weight:  84.5 kg    Height:        Exam:  alert, nad   no jvd  Chest cta bilat  Cor reg no RG  Abd soft ntnd no ascites   Ext bilat BKA's, no LE edema   Alert, NF, ox3    TDC intact   Renal-related home meds: Coreg 12.5 bid Sensipar 30mg  three times per week Midodrine 10mg  pre hd tts Renavite Renagel 1600 mg ac tid  OP HD: [facility] TTS 3.5h  B400   85.5kg  1K bath  TDC   Heparin none Micera 30 mcg IV/HD   Venofer  50 mg IV weekly    Assessment/Plan:  NSTEMI - cardiology consulted and started on IV heparin drip. LHC done 4/2 w/ stenting x 2. Due for further PCI on 4/4.   ESRD - HD TTS. Had HD 4/02 at [facility] prior to transfer to [facility]. HD today.   Volume - CXR 3/31 no edema. At dry wt. UF 1-2 L w/ hd today.   Anemia  - Hb 10-12 here. Follow.   Metabolic bone disease - CCa in range, cont binders ac and sensipar three times per week. Add on phos.   Nutrition -  renal diet, carb modified.    Anthony Moselle MD  [facility] 04/13/2023, 10:26 AM  Recent Labs  Lab 04/10/23 2210 04/11/23 0505 04/12/23 0656 04/13/23 0519  HGB 12.2*   < > 10.8* 10.4*  ALBUMIN 3.5  --   --   --   CALCIUM 8.7*   < > 8.5* 9.0  CREATININE 10.44*   < > 8.10* 10.29*  K 4.0   < > 3.7 4.1   < > = values in this interval not displayed.   No results for input(s): "IRON", "TIBC", "FERRITIN" in the last 168 hours. Inpatient medications:  aspirin EC  81 mg Oral Daily   atorvastatin  80 mg Oral QHS   carvedilol  12.5 mg Oral BID   Chlorhexidine Gluconate Cloth  6 each Topical Q0600   cinacalcet  30 mg Oral Q T,Th,Sa-HD    clopidogrel  75 mg Oral Q breakfast   furosemide  40 mg Intravenous Once   gabapentin  600 mg Oral BID   insulin aspart  0-15 Units Subcutaneous Q4H   insulin glargine-yfgn  12 Units Subcutaneous QHS   midodrine  10 mg Oral Once per day on Monday Wednesday Friday   multivitamin  1 tablet Oral Daily   pantoprazole  40 mg Oral Daily   sevelamer carbonate  1,600 mg Oral TID WC   sodium chloride flush  3 mL Intravenous Q12H    sodium chloride     heparin 1,100 Units/hr (04/13/23 0957)   sodium chloride, acetaminophen, ALPRAZolam, fentaNYL (SUBLIMAZE) injection, magnesium hydroxide, nitroGLYCERIN, olopatadine, ondansetron (ZOFRAN) IV, sodium chloride flush, traZODone

## 2023-04-13 NOTE — Progress Notes (Signed)
 PHARMACY - ANTICOAGULATION CONSULT NOTE  Pharmacy Consult for heparin Indication: chest pain/ACS  Allergies  Allergen Reactions   Epoetin Alfa Other (See Comments)    Unknown    Ferumoxytol Other (See Comments)    Unknown    Morphine Sulfate Rash and Other (See Comments)    Itches all over, red spots    Patient Measurements: Height: 5\' 10"  (177.8 cm) Weight: 84.5 kg (186 lb 4.6 oz) IBW/kg (Calculated) : 73 HEPARIN DW (KG): 92.1  Vital Signs: Temp: 98.8 F (37.1 C) (04/03 0800) Temp Source: Oral (04/03 0800) BP: 122/50 (04/03 0800) Pulse Rate: 85 (04/03 0800)  Labs: Recent Labs    04/11/23 0505 04/11/23 0532 04/11/23 0800 04/11/23 0933 04/11/23 1936 04/11/23 2138 04/12/23 0656 04/12/23 0817 04/13/23 0519  HGB  --  10.6*  --   --   --   --  10.8*  --  10.4*  HCT  --  32.5*  --   --   --   --  32.2*  --  31.0*  PLT  --  187  --   --   --   --  172  --  188  APTT  --   --   --   --  142* 120* 71*  --   --   LABPROT 17.3*  --   --   --   --   --   --   --   --   INR 1.4*  --   --   --   --   --   --   --   --   HEPARINUNFRC  --   --   --   --   --   --  >1.10*  --   --   CREATININE 10.82*  --   --   --   --   --  8.10*  --  10.29*  TROPONINIHS 9,544*  --    < > 16,311*  --   --  12,078* 11,346*  --    < > = values in this interval not displayed.    Estimated Creatinine Clearance: 7.8 mL/min (A) (by C-G formula based on SCr of 10.29 mg/dL (H)).  Medications:  -Eliquis 5mg  PO BID   Assessment: 62 yoM presented to [facility] ED with shortness of breath. PMH includes: CHF, COPD, diabetes, ESRD, HLD, HTN, bilateral BKA, and paroxysmal afib (PTA eliquis). Pharmacy consulted to dose heparin for ACS given continued elevated troponins.  -Last dose of eliquis: 3/31 ~2000 (will utilize aPTTs for monitoring). Hgb 10s, plt 188. No s/sx of bleeding. Underwent cath 4/2 with DES to LAD/1st diag - plan for staged PCI to RCA still so restarting heparin while awaiting procedure. Was  previously therapeutic on 1100 units/hr prior to cath.    Goal of Therapy:  Heparin level 0.3-0.7 units/ml aPTT 66-102 seconds Monitor platelets by anticoagulation protocol: Yes   Plan:  -Restart heparin infusion at 1100 units/hr  -Order aPTT and heparin level 8 hr after restart -Monitor daily aPTT/HL until correlate, CBC, and for s/sx of bleeding   Thank you for allowing pharmacy to participate in this patient's care,  Sherron Monday, PharmD, BCCCP Clinical Pharmacist  Phone: [phone] 04/13/2023 8:37 AM  Please check AMION for all [facility] Pharmacy phone numbers After 10:00 PM, call Main Pharmacy [phone]

## 2023-04-13 NOTE — Progress Notes (Signed)
   04/13/23 1750  Vitals  Temp 98 F (36.7 C)  Pulse Rate 69  Resp 15  BP (!) 103/47  SpO2 100 %  O2 Device Room Air  Post Treatment  Dialyzer Clearance Clear  Hemodialysis Intake (mL) 0 mL  Liters Processed 84  Fluid Removed (mL) 2000 mL  Tolerated HD Treatment Yes   Received patient in bed to unit.  Alert and oriented.  Informed consent signed and in chart.   TX duration:3.5hrs  Patient tolerated well.  Transported back to the room  Alert, without acute distress.  Hand-off given to patient's nurse.   Access used: [facility]  Access issues: none  Total UF removed: 2L Medication(s) given: none   Anthony Bullock [facility]

## 2023-04-13 NOTE — Progress Notes (Signed)
 PHARMACY - ANTICOAGULATION CONSULT NOTE  Pharmacy Consult for heparin Indication: chest pain/ACS  Allergies  Allergen Reactions   Epoetin Alfa Other (See Comments)    Unknown    Ferumoxytol Other (See Comments)    Unknown    Morphine Sulfate Rash and Other (See Comments)    Itches all over, red spots    Patient Measurements: Height: 5\' 10"  (177.8 cm) Weight: 84.5 kg (186 lb 4.6 oz) IBW/kg (Calculated) : 73 HEPARIN DW (KG): 92.1  Vital Signs: Temp: 97.9 F (36.6 C) (04/03 1922) Temp Source: Oral (04/03 1922) BP: 130/51 (04/03 1922) Pulse Rate: 72 (04/03 1922)  Labs: Recent Labs    04/11/23 0505 04/11/23 0532 04/11/23 0933 04/11/23 1936 04/11/23 2138 04/12/23 0656 04/12/23 0817 04/13/23 0519 04/13/23 1231 04/13/23 2148  HGB  --    < >  --   --   --  10.8*  --  10.4* 10.7*  --   HCT  --    < >  --   --   --  32.2*  --  31.0* 31.8*  --   PLT  --    < >  --   --   --  172  --  188 185  --   APTT  --   --   --    < > 120* 71*  --   --   --  55*  LABPROT 17.3*  --   --   --   --   --   --   --   --   --   INR 1.4*  --   --   --   --   --   --   --   --   --   HEPARINUNFRC  --   --   --   --   --  >1.10*  --   --   --  0.47  CREATININE 10.82*  --   --   --   --  8.10*  --  10.29* 10.90*  --   TROPONINIHS 9,544*   < > 16,311*  --   --  12,078* 11,346*  --   --   --    < > = values in this interval not displayed.    Estimated Creatinine Clearance: 7.3 mL/min (A) (by C-G formula based on SCr of 10.9 mg/dL (H)).  Medications:  -Eliquis 5mg  PO BID   Assessment: 62 yoM presented to [facility] ED with shortness of breath. PMH includes: CHF, COPD, diabetes, ESRD, HLD, HTN, bilateral BKA, and paroxysmal afib (PTA eliquis). Pharmacy consulted to dose heparin for ACS given continued elevated troponins.  -Last dose of eliquis: 3/31 ~2000 (will utilize aPTTs for monitoring). Hgb 10s, plt 188. No s/sx of bleeding. Underwent cath 4/2 with DES to LAD/1st diag - plan for staged PCI to RCA  still so restarting heparin while awaiting procedure. Was previously therapeutic on 1100 units/hr prior to cath.   PM: aPTT 55 (subtherapeutic), HL 0.47 - no quite correlating. On heparin 1100 units/hr. No bleeding or issues with the infusion reported.   Goal of Therapy:  Heparin level 0.3-0.7 units/ml aPTT 66-102 seconds Monitor platelets by anticoagulation protocol: Yes   Plan:  -Increase heparin infusion at 1150 units/hr  -Recheck aPTT and heparin level 8 hr after -Monitor daily aPTT/HL until correlate, CBC, and for s/sx of bleeding   Thank you for allowing pharmacy to participate in this patient's care,  Loralee Pacas, PharmD, BCPS Clinical Pharmacist  Phone: [phone] 04/13/2023 10:42 PM  Please check AMION for all [facility] Pharmacy phone numbers After 10:00 PM, call Main Pharmacy [phone]

## 2023-04-13 NOTE — Progress Notes (Addendum)
 PROGRESS NOTE   Anthony Bullock  ZOX:[id number]    DOB: [birth year]    DOA: 04/10/2023  PCP: Kristian Covey, MD   I have briefly reviewed patients previous medical records in [facility].  Chief Complaint  Patient presents with   Shortness of Breath    Brief Hospital Course:   63 year old male with type 2 diabetes mellitus with diabetic nephropathy, neuropathy and vascular complications, ESRD on hemodialysis TTS, hypertension, PVD status post bilateral BKA, dyslipidemia, urolithiasis, NICM missed HD last Sat presented to ED with shortness of breath and chest pain.  His troponins peaked to around 16K, cardiology consulted, diagnosed him with NSTEMI, started on IV heparin drip and transferred to [facility] for cardiac cath.  Nephrology consulted and he underwent HD at [facility] on 4/1 prior to transfer.  S/p cardiac cath 4/2 with stenting of LAD and first diagonal.  Plan for staged PCI on 4/4 after dialysis on 4/3.   Assessment & Plan:  Principal Problem:   NSTEMI (non-ST elevated myocardial infarction) (HCC) Active Problems:   Type 2 diabetes mellitus with peripheral neuropathy (HCC)   Paroxysmal atrial fibrillation (HCC)   End-stage renal disease on hemodialysis (HCC)   Dyslipidemia   GERD without esophagitis   NSTEMI/severe three-vessel obstructive CAD His troponins peaked to around 16K,  Cardiology consulted, diagnosed him with NSTEMI, started on IV heparin drip and transferred to [facility] for cardiac cath.   Nephrology consulted and he underwent HD at [facility] on 4/1 prior to transfer.   S/p cardiac cath 4/2 with stenting of LAD and first diagonal.  Plan for staged PCI on 4/4 after dialysis on 4/3. As per cardiology, recommend triple therapy x 1 month then continue apixaban and Plavix for 12 months, continue atorvastatin. Echo shows ICM with LVEF of 40%. Apixaban on hold until staged PCI RCA tomorrow of.  Currently not on IV heparin either.  ESRD on  hemodialysis S/p HD at [facility] on 4/1 prior to transfer and cardiac cath Discussed with Dr. Arlean Hopping, Nephrology, plan for HD today and then again on Saturday prior to possible discharge. As communicated with Dr. Arlean Hopping and pharmacy, getting midodrine on dialysis days TTS.  Anemia of ESRD Stable.  Hyponatremia Stable in the low 130s.  Dyslipidemia - continue statin therapy as well as fenofibrate and LDL notably 7 on lipid panel   Type 2 diabetes mellitus with peripheral neuropathy  Continue current dose of Semglee, change SSI to at mealtime with bedtime scale. Reasonable in-house control.  Monitor and adjust insulins as needed.   Paroxysmal atrial fibrillation  - Post cath off of IV heparin drip per cardiology.  Eliquis also on hold until Tomorrow. - continue beta-blocker therapy.  Pressure Injury 04/11/23 Buttocks Right;Left Stage 2 -  Partial thickness loss of dermis presenting as a shallow open injury with a red, pink wound bed without slough. nonblanchable area what appears to be a healing stage 2 with dry flacky skin covering (Active)  04/11/23 1916  Location: Buttocks  Location Orientation: Right;Left  Staging: Stage 2 -  Partial thickness loss of dermis presenting as a shallow open injury with a red, pink wound bed without slough.  Wound Description (Comments): nonblanchable area what appears to be a healing stage 2 with dry flacky skin covering  Present on Admission: Yes  Agree with evaluation and management of pressure injury as noted above.    Body mass index is 26.73 kg/m.   DVT prophylaxis:   Subcutaneous heparin  Code Status: Full Code:  Family Communication: None at bedside Disposition:  Status is: Inpatient Remains inpatient appropriate because: Staged PCI planned for 4/4.  Possible DC home 4/5 pending cardiology and nephrology clearance, after HD on Saturday.     Consultants:   Cardiology Nephrology  Procedures:   Cardiac cath with stent 4/2 HD at  [facility] 4/1  Antimicrobials:      Subjective:  Seen this morning.  Denies chest pain or dyspnea.  Objective:   Vitals:   04/13/23 1400 04/13/23 1408 04/13/23 1430 04/13/23 1500  BP: (!) 125/55 (!) 129/41 (!) 125/54 (!) 110/52  Pulse: 72 71 72 67  Resp: 12 12 11  (!) 31  Temp: 98.6 F (37 C)     TempSrc:      SpO2: 99% 99% 100% 99%  Weight: 84.5 kg     Height:        General exam: Middle-age male, moderately built and nourished lying comfortably propped up in bed without distress. Respiratory system: Clear to auscultation.  No increased work of breathing. Cardiovascular system: S1 & S2 heard, RRR. No JVD, murmurs, rubs, gallops or clicks. No pedal edema.  Telemetry personally reviewed: Sinus rhythm with BBB morphology. Gastrointestinal system: Abdomen is nondistended, soft and nontender. No organomegaly or masses felt. Normal bowel sounds heard. Central nervous system: Alert and oriented. No focal neurological deficits. Extremities: Symmetric 5 x 5 power.  Right groin cath site without acute findings. Skin: No rashes, lesions or ulcers.   Psychiatry: Judgement and insight appear normal. Mood & affect appropriate.     Data Reviewed:   I have personally reviewed following labs and imaging studies   CBC: Recent Labs  Lab 04/10/23 2210 04/11/23 0532 04/12/23 0656 04/13/23 0519 04/13/23 1231  WBC 11.5*   < > 8.6 12.3* 11.5*  NEUTROABS 8.6*  --   --   --   --   HGB 12.2*   < > 10.8* 10.4* 10.7*  HCT 36.5*   < > 32.2* 31.0* 31.8*  MCV 88.6   < > 89.2 89.1 89.3  PLT 218   < > 172 188 185   < > = values in this interval not displayed.    Basic Metabolic Panel: Recent Labs  Lab 04/10/23 2210 04/11/23 0505 04/12/23 0656 04/13/23 0519 04/13/23 1231  NA 130* 129* 131* 132* 129*  K 4.0 3.8 3.7 4.1 3.9  CL 92* 95* 95* 94* 93*  CO2 20* 19* 23 21* 19*  GLUCOSE 138* 120* 190* 109* 202*  BUN 78* 83* 49* 63* 63*  CREATININE 10.44* 10.82* 8.10* 10.29* 10.90*   CALCIUM 8.7* 8.4* 8.5* 9.0 9.0  PHOS  --   --   --   --  7.4*    Liver Function Tests: Recent Labs  Lab 04/10/23 2210 04/13/23 1231  AST 37  --   ALT 25  --   ALKPHOS 114  --   BILITOT 0.8  --   PROT 7.5  --   ALBUMIN 3.5 2.6*    CBG: Recent Labs  Lab 04/13/23 0409 04/13/23 0723 04/13/23 1207  GLUCAP 100* 86 197*    Microbiology Studies:  No results found for this or any previous visit (from the past 240 hours).  Radiology Studies:  CARDIAC CATHETERIZATION Result Date: 04/12/2023   Ost RCA lesion is 80% stenosed.   Prox RCA to Mid RCA lesion is 80% stenosed.   Ost LM lesion is 45% stenosed.   Prox LAD to Mid LAD  lesion is 85% stenosed.   1st Diag lesion is 95% stenosed.   1st Mrg lesion is 100% stenosed.   Dist Cx-1 lesion is 85% stenosed.   Dist Cx-2 lesion is 100% stenosed.   A drug-eluting stent was successfully placed using a SYNERGY XD 2.50X12 in the side branch.   A drug-eluting stent was successfully placed using a SYNERGY XD 2.50X32 in the main branch.   Angioplasty was performed in the main branch. .   Angioplasty was performed in the side branch. .   Post intervention, there is a 0% residual stenosis.   Post intervention, there is a 0% residual stenosis.   LV end diastolic pressure is normal.   Recommend to resume Apixaban, at currently prescribed dose and frequency.   Recommend concurrent antiplatelet therapy of Aspirin 81 mg for 1 month and Clopidogrel 75mg  daily for 12 months . Severe 3 vessel obstructive CAD. Suspect culprit vessel is the first OM which is occluded Normal LVEDP Successful bifurcation stenting of the LAD/first diagonal with 2.5 x 12 mm Synergy stent in the diagonal and 2.5 x 32 mm Synergy stent in the LAD Plan: DAPT with ASA 81 mg daily for one month. Plavix 75 mg daily for 12 months for ACS. Anticipate resuming Eliquis once procedures are completed. Recommend return for staged PCI of the RCA at the ostium and mid vessel. Would treat LCx disease  medically.    Scheduled Meds:    aspirin EC  81 mg Oral Daily   atorvastatin  80 mg Oral QHS   carvedilol  12.5 mg Oral BID   Chlorhexidine Gluconate Cloth  6 each Topical Q0600   [START ON 04/14/2023] Chlorhexidine Gluconate Cloth  6 each Topical Q0600   cinacalcet  30 mg Oral Q T,Th,Sa-HD   clopidogrel  75 mg Oral Q breakfast   furosemide  40 mg Intravenous Once   [START ON 04/14/2023] gabapentin  300 mg Oral QHS   insulin aspart  0-15 Units Subcutaneous Q4H   insulin glargine-yfgn  12 Units Subcutaneous QHS   midodrine  10 mg Oral Once per day on Tuesday Thursday Saturday   multivitamin  1 tablet Oral Daily   pantoprazole  40 mg Oral Daily   sevelamer carbonate  1,600 mg Oral TID WC   sodium chloride flush  3 mL Intravenous Q12H    Continuous Infusions:    anticoagulant sodium citrate     heparin 1,100 Units/hr (04/13/23 0957)     LOS: 2 days     Marcellus Scott, MD,  FACP, [facility], [facility], [facility]   [facility] Advisor [place]      To contact the attending provider between 7A-7P or the covering provider during after hours 7P-7A, please log into the web site [email or web address] and access using universal [place] password for that web site. If you do not have the password, please call the hospital operator.  04/13/2023, 3:31 PM

## 2023-04-13 NOTE — Progress Notes (Signed)
   Patient Name: Anthony Bullock Date of Encounter: 04/13/2023 [facility] Cardiologist: Dina Rich, MD   Interval Summary  .    Patient resting comfortably in bed. Reports mild R groin soreness 2/2 vascular access for catheterization. No chest pain or shortness of breath. Is pending scheduled HD today with plans for staged PCI of RCA tomorrow.  Vital Signs .    Vitals:   04/12/23 1912 04/12/23 2243 04/13/23 0315 04/13/23 0800  BP: (!) 133/47  (!) 114/50 (!) 122/50  Pulse: 83 83 75 85  Resp: 12 14 14    Temp: 99.2 F (37.3 C)  98.2 F (36.8 C) 98.8 F (37.1 C)  TempSrc: Oral  Oral Oral  SpO2: 98% 99% 99% 93%  Weight:   84.5 kg   Height:        Intake/Output Summary (Last 24 hours) at 04/13/2023 0813 Last data filed at 04/13/2023 0335 Gross per 24 hour  Intake 361.97 ml  Output --  Net 361.97 ml      04/13/2023    3:15 AM 04/11/2023    7:36 PM 04/11/2023    5:40 PM  Last 3 Weights  Weight (lbs) 186 lb 4.6 oz 189 lb 9.5 oz 202 lb 13.2 oz  Weight (kg) 84.5 kg 86 kg 92 kg      Telemetry/ECG    Sinus rhythm with borderline 1st degree AVB, isolated PVCs - Personally Reviewed  Physical Exam .   GEN: No acute distress.   Neck: No JVD Cardiac: RRR, no murmurs, rubs, or gallops.  Respiratory: Clear to auscultation bilaterally. GI: Soft, nontender, non-distended  MS: bilateral BKA. No above the knee edema. Right groin vascular access appears well, with no blood on dressing, no swelling or hematoma.   Assessment & Plan .     NSTEMI [facility] to [facility] with substernal chest pain, 8/10. Has had this type of chest pain before. hsTN 176 >>2147>>9,544 >>15,002. Admission ECG with sinus rhythm, chronic LBBB. PCI on 04/02 showed severe 3 vessel CAD. Successful bifurcation stenting of the LAD/first diagonal with 2.5 x 12 mm Synergy stent in the diagonal and 2.5 x 32 mm Synergy stent in the LAD. Ost RCA 80% stenosed, prox to mid RCA 80%. Staged PCI of RCA on 4/4  with Dr. Herbie Baltimore. Continue high dose statin, Coreg 12.5mg  BID Plavix 75mg  added, will plan to stop ASA after 1 month of triple therapy.   Chronic HFmrEF Patient with LVEF 40% on 11/29/22 TTE. Repeat US this admission with stable LVEF 40% with global hypokinesis.  GDMT limited by ESRD status. Continue Coreg 12.5mg  Volume management by nephrology/dialysis   Paroxysmal atrial fibrillation Sinus rhythm on ECG this admission. Ventricular rates remain well controlled.  Continue Coreg 12.5mg  BID Continue Heparin bridging pending staged PCI. Resume Eliquis following.   Dyslipidemia LDL 7, triglycerides 203. Continue Atorvastatin 80mg .   Fenofibrate stopped given renal dysfunction. Could consider Vascepa for triglyceride management.   Hypertension Patient with somewhat labile BP this admission. More stable today. Typically requires Midodrine to support BP for dialysis.  Continue Coreg 12.5mg  BID. If low BP becomes a bigger issue, consider transitioning to Toprol XL.    Per primary team: DM type II ESRD GERD   For questions or updates, please contact [facility] Please consult [email or web address] for contact info under        Signed, Perlie Gold, PA-C

## 2023-04-13 NOTE — Progress Notes (Signed)
 Ok to reduce gabapentin to 300mg  qday due to his ESRD per Dr. Waymon Amato.  Ulyses Southward, PharmD, BCIDP, AAHIVP, CPP Infectious Disease Pharmacist 04/13/2023 2:19 PM

## 2023-04-14 ENCOUNTER — Ambulatory Visit: Payer: Medicare Other | Admitting: Nurse Practitioner

## 2023-04-14 ENCOUNTER — Inpatient Hospital Stay (HOSPITAL_COMMUNITY)
Admission: EM | Disposition: A | Discharge status: Home / Self Care | Payer: Self-pay | Source: Home / Self Care | Attending: Internal Medicine

## 2023-04-14 DIAGNOSIS — I25110 Atherosclerotic heart disease of native coronary artery with unstable angina pectoris: Secondary | ICD-10-CM

## 2023-04-14 DIAGNOSIS — I214 Non-ST elevation (NSTEMI) myocardial infarction: Secondary | ICD-10-CM | POA: Diagnosis not present

## 2023-04-14 HISTORY — PX: CORONARY LITHOTRIPSY: CATH118330

## 2023-04-14 HISTORY — PX: CORONARY STENT INTERVENTION: CATH118234

## 2023-04-14 LAB — POCT ACTIVATED CLOTTING TIME
Activated Clotting Time: 210 s
Activated Clotting Time: 193 s
Activated Clotting Time: 176 s

## 2023-04-14 LAB — GLUCOSE, CAPILLARY
Glucose-Capillary: 109 mg/dL — ABNORMAL HIGH (ref 70–99)
Glucose-Capillary: 111 mg/dL — ABNORMAL HIGH (ref 70–99)
Glucose-Capillary: 124 mg/dL — ABNORMAL HIGH (ref 70–99)
Glucose-Capillary: 138 mg/dL — ABNORMAL HIGH (ref 70–99)
Glucose-Capillary: 71 mg/dL (ref 70–99)
Glucose-Capillary: 87 mg/dL (ref 70–99)

## 2023-04-14 LAB — RENAL FUNCTION PANEL
Albumin: 2.9 g/dL — ABNORMAL LOW (ref 3.5–5.0)
Anion gap: 15 (ref 5–15)
BUN: 35 mg/dL — ABNORMAL HIGH (ref 8–23)
CO2: 23 mmol/L (ref 22–32)
Calcium: 8.8 mg/dL — ABNORMAL LOW (ref 8.9–10.3)
Chloride: 95 mmol/L — ABNORMAL LOW (ref 98–111)
Creatinine, Ser: 7.67 mg/dL — ABNORMAL HIGH (ref 0.61–1.24)
GFR, Estimated: 7 mL/min — ABNORMAL LOW (ref >60)
Glucose, Bld: 116 mg/dL — ABNORMAL HIGH (ref 70–99)
Phosphorus: 4.9 mg/dL — ABNORMAL HIGH (ref 2.5–4.6)
Potassium: 3.8 mmol/L (ref 3.5–5.1)
Sodium: 133 mmol/L — ABNORMAL LOW (ref 135–145)

## 2023-04-14 LAB — CBC
HCT: 33.4 % — ABNORMAL LOW (ref 39.0–52.0)
Hemoglobin: 10.9 g/dL — ABNORMAL LOW (ref 13.0–17.0)
MCH: 29.1 pg (ref 26.0–34.0)
MCHC: 32.6 g/dL (ref 30.0–36.0)
MCV: 89.1 fL (ref 80.0–100.0)
Platelets: 204 10*3/uL (ref 150–400)
RBC: 3.75 MIL/uL — ABNORMAL LOW (ref 4.22–5.81)
RDW: 14.6 % (ref 11.5–15.5)
WBC: 13.5 10*3/uL — ABNORMAL HIGH (ref 4.0–10.5)
nRBC: 0 % (ref 0.0–0.2)

## 2023-04-14 LAB — APTT: aPTT: 92 s — ABNORMAL HIGH (ref 24–36)

## 2023-04-14 LAB — HEPARIN LEVEL (UNFRACTIONATED): Heparin Unfractionated: 0.55 [IU]/mL (ref 0.30–0.70)

## 2023-04-14 SURGERY — CORONARY STENT INTERVENTION
Anesthesia: LOCAL

## 2023-04-14 MED ORDER — SODIUM CHLORIDE 0.9% FLUSH: 3.0000 mL | INTRAVENOUS | Status: DC | PRN

## 2023-04-14 MED ORDER — LIDOCAINE HCL (PF) 1 % IJ SOLN
INTRAMUSCULAR | Status: AC
  Filled 2023-04-14: qty 30

## 2023-04-14 MED ORDER — MIDAZOLAM HCL 2 MG/2ML IJ SOLN
INTRAMUSCULAR | Status: AC
  Filled 2023-04-14: qty 2

## 2023-04-14 MED ORDER — SODIUM CHLORIDE 0.9 % WEIGHT BASED INFUSION: 3.0000 mL/kg/h | INTRAVENOUS | Status: DC

## 2023-04-14 MED ORDER — SODIUM CHLORIDE 0.9 % WEIGHT BASED INFUSION: 1.0000 mL/kg/h | INTRAVENOUS | Status: DC

## 2023-04-14 MED ORDER — SODIUM CHLORIDE 0.9% FLUSH
3.0000 mL | Freq: Two times a day (BID) | INTRAVENOUS | Status: DC
  Administered 2023-04-15: 3 mL via INTRAVENOUS

## 2023-04-14 MED ORDER — CHLORHEXIDINE GLUCONATE CLOTH 2 % EX PADS: 6.0000 | MEDICATED_PAD | Freq: Every day | CUTANEOUS | Status: DC

## 2023-04-14 MED ORDER — SODIUM CHLORIDE 0.9 % IV SOLN: INTRAVENOUS | Status: DC

## 2023-04-14 MED ORDER — NITROGLYCERIN 1 MG/10 ML FOR IR/CATH LAB
INTRA_ARTERIAL | Status: AC
Start: 2023-04-14 — End: 2023-04-15
  Filled 2023-04-14: qty 10

## 2023-04-14 MED ORDER — ASPIRIN 81 MG PO CHEW
81.0000 mg | CHEWABLE_TABLET | ORAL | Status: AC
  Administered 2023-04-14: 81 mg via ORAL
  Filled 2023-04-14: qty 1

## 2023-04-14 MED ORDER — HYDROMORPHONE HCL 1 MG/ML IJ SOLN: 0.5000 mg | INTRAMUSCULAR | Status: DC | PRN

## 2023-04-14 MED ORDER — HEPARIN SODIUM (PORCINE) 1000 UNIT/ML IJ SOLN
INTRAMUSCULAR | Status: DC | PRN
Start: 2023-04-14 — End: 2023-04-14
  Administered 2023-04-14: 4000 [IU] via INTRAVENOUS
  Administered 2023-04-14: 6000 [IU] via INTRAVENOUS

## 2023-04-14 MED ORDER — FENTANYL CITRATE (PF) 100 MCG/2ML IJ SOLN
INTRAMUSCULAR | Status: AC
  Filled 2023-04-14: qty 2

## 2023-04-14 MED ORDER — LABETALOL HCL 5 MG/ML IV SOLN: 10.0000 mg | INTRAVENOUS | Status: AC | PRN

## 2023-04-14 MED ORDER — MIDAZOLAM HCL 2 MG/2ML IJ SOLN
INTRAMUSCULAR | Status: DC | PRN
  Administered 2023-04-14: 1 mg via INTRAVENOUS

## 2023-04-14 MED ORDER — HEPARIN (PORCINE) IN NACL 1000-0.9 UT/500ML-% IV SOLN
INTRAVENOUS | Status: DC | PRN
  Administered 2023-04-14 (×2): 500 mL

## 2023-04-14 MED ORDER — VERAPAMIL HCL 2.5 MG/ML IV SOLN
INTRAVENOUS | Status: AC
  Filled 2023-04-14: qty 2

## 2023-04-14 MED ORDER — HEPARIN SODIUM (PORCINE) 1000 UNIT/ML IJ SOLN
INTRAMUSCULAR | Status: AC
  Filled 2023-04-14: qty 10

## 2023-04-14 MED ORDER — APIXABAN 5 MG PO TABS
5.0000 mg | ORAL_TABLET | Freq: Two times a day (BID) | ORAL | Status: DC
  Administered 2023-04-15: 5 mg via ORAL
  Filled 2023-04-14: qty 1

## 2023-04-14 MED ORDER — SODIUM CHLORIDE 0.9 % IV SOLN: 250.0000 mL | INTRAVENOUS | Status: DC | PRN

## 2023-04-14 MED ORDER — LIDOCAINE HCL (PF) 1 % IJ SOLN
INTRAMUSCULAR | Status: DC | PRN
  Administered 2023-04-14: 15 mL via INTRADERMAL

## 2023-04-14 MED ORDER — HYDRALAZINE HCL 20 MG/ML IJ SOLN: 10.0000 mg | INTRAMUSCULAR | Status: AC | PRN

## 2023-04-14 MED ORDER — IOHEXOL 350 MG/ML SOLN
INTRAVENOUS | Status: DC | PRN
  Administered 2023-04-14: 90 mL

## 2023-04-14 MED ORDER — FENTANYL CITRATE (PF) 100 MCG/2ML IJ SOLN
INTRAMUSCULAR | Status: DC | PRN
  Administered 2023-04-14: 25 ug via INTRAVENOUS

## 2023-04-14 SURGICAL SUPPLY — 23 items
BALLN EMERGE MR 3.0X12 (BALLOONS) ×1 IMPLANT
BALLN SCOREFLEX 3.50X15 (BALLOONS) ×1 IMPLANT
BALLN ~~LOC~~ EMERGE MR 3.75X15 (BALLOONS) ×1 IMPLANT
BALLN ~~LOC~~ EMERGE MR 4.0X8 (BALLOONS) ×1 IMPLANT
BALLOON EMERGE MR 3.0X12 (BALLOONS) IMPLANT
BALLOON SCOREFLEX 3.50X15 (BALLOONS) IMPLANT
BALLOON ~~LOC~~ EMERGE MR 3.75X15 (BALLOONS) IMPLANT
BALLOON ~~LOC~~ EMERGE MR 4.0X8 (BALLOONS) IMPLANT
CATH SHOCKWAVE C2 3.5X12 (CATHETERS) IMPLANT
CATH VISTA GUIDE 6FR JR4 ECOPK (CATHETERS) IMPLANT
ELECT DEFIB PAD ADLT CADENCE (PAD) IMPLANT
KIT ENCORE 26 ADVANTAGE (KITS) IMPLANT
KIT MICROPUNCTURE NIT STIFF (SHEATH) IMPLANT
PACK CARDIAC CATHETERIZATION (CUSTOM PROCEDURE TRAY) ×2 IMPLANT
SHEATH PINNACLE 6F 10CM (SHEATH) IMPLANT
STENT SYNERGY XD 3.50X16 (Permanent Stent) IMPLANT
STENT SYNERGY XD 3.50X28 (Permanent Stent) IMPLANT
SYNERGY XD 3.50X16 (Permanent Stent) ×1 IMPLANT
SYNERGY XD 3.50X28 (Permanent Stent) ×1 IMPLANT
TUBING CIL FLEX 10 FLL-RA (TUBING) IMPLANT
WIRE ASAHI PROWATER 180CM (WIRE) IMPLANT
WIRE EMERALD 3MM-J .035X150CM (WIRE) IMPLANT
WIRE HI TORQ BMW 190CM (WIRE) IMPLANT

## 2023-04-14 NOTE — Progress Notes (Signed)
 Pt's ACT 176. Dr. Herbie Baltimore called with update. Verbal order given to this RN to pull sheath.

## 2023-04-14 NOTE — Care Management Important Message (Signed)
 Important Message  Patient Details  Name: Anthony Bullock MRN: [id number] Date of Birth: 07-08-1960   Important Message Given:  Yes - Medicare IM     [name] 04/14/2023, 10:25 AM

## 2023-04-14 NOTE — H&P (View-Only) (Signed)
 Patient seen and examined, note reviewed with the signed Advanced Practice Provider. I personally reviewed laboratory data, imaging studies and relevant notes. I independently examined the patient and formulated the important aspects of the plan. I have personally discussed the plan with the patient and/or family. Comments or changes to the note/plan are indicated below.  No acute events overnight, no new concerns. Pending staged PCI today. Exam unchanged except for small superfical skin tear over R femoral groin site, no oozing but sequelae of recent bleeding.  Anticipate staged PCI today, will watch overnight and likely will be ok to discharge from cardiac perspective tomorrow if procedure goes well. Ultimate timing of discharge depending on dialysis plan. Agree with note as otherwise documented.  Jodelle Red, MD, PhD, [facility]  [facility]  [place]  [facility] at [facility] [address] [place], [place] [address] [phone]       Progress Note  Patient Name: Anthony Bullock Date of Encounter: 04/14/2023  Primary Cardiologist: Dina Rich, MD  Subjective   Feeling well without complaint. No CP or SOB.  Inpatient Medications    Scheduled Meds:  aspirin EC  81 mg Oral Daily   atorvastatin  80 mg Oral QHS   carvedilol  12.5 mg Oral BID   Chlorhexidine Gluconate Cloth  6 each Topical Q0600   cinacalcet  30 mg Oral Q T,Th,Sa-HD   clopidogrel  75 mg Oral Q breakfast   furosemide  40 mg Intravenous Once   gabapentin  300 mg Oral QHS   insulin aspart  0-15 Units Subcutaneous Q4H   insulin glargine-yfgn  12 Units Subcutaneous QHS   midodrine  10 mg Oral Once per day on Tuesday Thursday Saturday   multivitamin  1 tablet Oral Daily   pantoprazole  40 mg Oral Daily   sevelamer carbonate  1,600 mg Oral TID WC   sodium chloride flush  3 mL Intravenous Q12H   Continuous Infusions:  sodium chloride  10 mL/hr at 04/14/23 0859   heparin 1,150 Units/hr (04/14/23 0845)   PRN Meds: acetaminophen, ALPRAZolam, fentaNYL (SUBLIMAZE) injection, magnesium hydroxide, nitroGLYCERIN, olopatadine, ondansetron (ZOFRAN) IV, sodium chloride flush, traZODone   Vital Signs    Vitals:   04/13/23 1750 04/13/23 1851 04/13/23 1922 04/14/23 0359  BP: (!) 103/47 (!) 105/54 (!) 130/51 (!) 106/52  Pulse: 69 74 72 70  Resp: 15 16 16 18   Temp: 98 F (36.7 C) 98.9 F (37.2 C) 97.9 F (36.6 C) 99.4 F (37.4 C)  TempSrc:  Oral Oral Oral  SpO2: 100% 100% 100% 98%  Weight:      Height:        Intake/Output Summary (Last 24 hours) at 04/14/2023 0958 Last data filed at 04/14/2023 2536 Gross per 24 hour  Intake 433.3 ml  Output 2000 ml  Net -1566.7 ml      04/13/2023    2:00 PM 04/13/2023    3:15 AM 04/11/2023    7:36 PM  Last 3 Weights  Weight (lbs) 186 lb 4.6 oz 186 lb 4.6 oz 189 lb 9.5 oz  Weight (kg) 84.5 kg 84.5 kg 86 kg     Telemetry    NSR - Personally Reviewed  ECG    No new tracings - Personally Reviewed  Physical Exam   GEN: No acute distress.  HEENT: Normocephalic, atraumatic, sclera non-icteric. Neck: No JVD or bruits. Cardiac: RRR no murmurs, rubs, or gallops.  Respiratory: Clear to auscultation bilaterally.  Breathing is unlabored. GI: Soft, nontender, non-distended, BS +x 4. MS: no deformity. Extremities: No clubbing or cyanosis. S/p bilateral BKA. Right groin cath site without hematoma, ecchymosis, or bruit. There was a tiny skin tear formed from where the gauze had adhered to old blood on his skin and pulled this. There was scant blotting of fresh blood at the small skin tear upon initial reveal, improved with observation. No frank oozing. Neuro:  AAOx3. Follows commands. Psych:  Responds to questions appropriately with a normal affect.  Labs    High Sensitivity Troponin:   Recent Labs  Lab 04/11/23 0505 04/11/23 0800 04/11/23 0933 04/12/23 0656 04/12/23 0817  TROPONINIHS  9,544* 15,002* 16,311* 12,078* 11,346*      Cardiac EnzymesNo results for input(s): "TROPONINI" in the last 168 hours. No results for input(s): "TROPIPOC" in the last 168 hours.   Chemistry Recent Labs  Lab 04/10/23 2210 04/11/23 0505 04/12/23 0656 04/13/23 0519 04/13/23 1231  NA 130*   < > 131* 132* 129*  K 4.0   < > 3.7 4.1 3.9  CL 92*   < > 95* 94* 93*  CO2 20*   < > 23 21* 19*  GLUCOSE 138*   < > 190* 109* 202*  BUN 78*   < > 49* 63* 63*  CREATININE 10.44*   < > 8.10* 10.29* 10.90*  CALCIUM 8.7*   < > 8.5* 9.0 9.0  PROT 7.5  --   --   --   --   ALBUMIN 3.5  --   --   --  2.6*  AST 37  --   --   --   --   ALT 25  --   --   --   --   ALKPHOS 114  --   --   --   --   BILITOT 0.8  --   --   --   --   GFRNONAA 5*   < > 7* 5* 5*  ANIONGAP 18*   < > 13 17* 17*   < > = values in this interval not displayed.     Hematology Recent Labs  Lab 04/12/23 0656 04/13/23 0519 04/13/23 1231  WBC 8.6 12.3* 11.5*  RBC 3.61* 3.48* 3.56*  HGB 10.8* 10.4* 10.7*  HCT 32.2* 31.0* 31.8*  MCV 89.2 89.1 89.3  MCH 29.9 29.9 30.1  MCHC 33.5 33.5 33.6  RDW 14.4 14.6 14.6  PLT 172 188 185    BNPNo results for input(s): "BNP", "PROBNP" in the last 168 hours.   DDimer No results for input(s): "DDIMER" in the last 168 hours.   Radiology    No results found.  Cardiac Studies   Cath 04/12/23      Ost RCA lesion is 80% stenosed.   Prox RCA to Mid RCA lesion is 80% stenosed.   Ost LM lesion is 45% stenosed.   Prox LAD to Mid LAD lesion is 85% stenosed.   1st Diag lesion is 95% stenosed.   1st Mrg lesion is 100% stenosed.   Dist Cx-1 lesion is 85% stenosed.   Dist Cx-2 lesion is 100% stenosed.   A drug-eluting stent was successfully placed using a SYNERGY XD 2.50X12 in the side branch.   A drug-eluting stent was successfully placed using a SYNERGY XD 2.50X32 in the main branch.   Angioplasty was performed in the main branch. .   Angioplasty was performed in the side branch. .    Post intervention, there is a  0% residual stenosis.   Post intervention, there is a 0% residual stenosis.   LV end diastolic pressure is normal.   Recommend to resume Apixaban, at currently prescribed dose and frequency.   Recommend concurrent antiplatelet therapy of Aspirin 81 mg for 1 month and Clopidogrel 75mg  daily for 12 months .   Severe 3 vessel obstructive CAD. Suspect culprit vessel is the first OM which is occluded Normal LVEDP Successful bifurcation stenting of the LAD/first diagonal with 2.5 x 12 mm Synergy stent in the diagonal and 2.5 x 32 mm Synergy stent in the LAD   Plan: DAPT with ASA 81 mg daily for one month. Plavix 75 mg daily for 12 months for ACS. Anticipate resuming Eliquis once procedures are completed. Recommend return for staged PCI of the RCA at the ostium and mid vessel. Would treat LCx disease medically.    2D echo 04/11/23   1. Left ventricular ejection fraction, by estimation, is 40%. The left  ventricle has mildly decreased function. The left ventricle demonstrates  global hypokinesis. There is moderate left ventricular hypertrophy. Left  ventricular diastolic parameters are   consistent with Grade I diastolic dysfunction (impaired relaxation).  Elevated left atrial pressure.   2. Right ventricular systolic function is normal. The right ventricular  size is normal.   3. The mitral valve is normal in structure. No evidence of mitral valve  regurgitation. No evidence of mitral stenosis.   4. The aortic valve is tricuspid. Aortic valve regurgitation is not  visualized. No aortic stenosis is present.   5. The inferior vena cava is normal in size with greater than 50%  respiratory variability, suggesting right atrial pressure of 3 mmHg.   Patient Profile     62 y.o. male with chronic HFrEF/NICM s/p Bos Sci S-ICD 06/2013 subsequently left in place after battery ran out, mostly nonobstructive CAD by cath 2015 (diffuse diabetic vasculopathy managed medically),  ESRD on HD, HTN then issues with hypotension, HLD, DM2, mild carotid disease (1-39% BICA 12/2022), LBBB, moderate MR by echo 08/2022, osteomyelitis, bilateral BKA, prior gangrenous wound, anemia, arthritis, kidney stone. He has had multiple recurrent admissions for issues with respiratory failure, volume overload, PND, previous COVID-19, complicated at times by AF RVR requiring IV amiodarone and mildly elevated troponins. He presented to [facility] this admission with chest pain and NSTEMI.  Assessment & Plan    1. NSTEMI/CAD [facility] with substernal chest pain, 8/10. Has had this type of chest pain before. hsTN 176 >>2147>>9,544 >>15,002. Admission ECG with sinus rhythm, chronic LBBB. PCI on 04/02 showed severe 3 vessel CAD. Successful bifurcation stenting of the LAD/first diagonal with 2.5 x 12 mm Synergy stent in the diagonal and 2.5 x 32 mm Synergy stent in the LAD. Ost RCA 80% stenosed, prox to mid RCA 80%. Plan staged PCI of RCA  with Dr. Herbie Baltimore today. Labs pending at this time. Continue high dose statin, Coreg 12.5mg  BID Plavix 75mg  added with plan to stop ASA after 1 month of triple therapy There was a tiny skin tear formed from where the previous cath site gauze had adhered to old blood on his skin and pulled his skin. There was scant blotting of fresh blood at the small skin tear upon initial reveal, improved with observation and re-blotting. No frank oozing. Reviewed cath site with MD, felt stable, continue to observe after second procedure as well  Informed Consent   Shared Decision Making/Informed Consent The risks [stroke (1 in 1000), death (1  in 1000), kidney failure [usually temporary] (1 in 500), bleeding (1 in 200), allergic reaction [possibly serious] (1 in 200)], benefits (diagnostic support and management of coronary artery disease) and alternatives of a cardiac catheterization were discussed in detail with Anthony Bullock and he is willing to proceed.      2. Chronic  HFmrEF Patient with LVEF 40% on 11/29/22 TTE. Repeat US this admission with stable LVEF 40% with global hypokinesis.  GDMT limited by ESRD status. Continue Coreg 12.5mg  BID as BP tolerates Volume management by nephrology/dialysis   3. Paroxysmal atrial fibrillation Sinus rhythm on ECG this admission. Ventricular rates remain well controlled.  Continue Coreg 12.5mg  BID Continue Heparin bridging pending staged PCI. Resume Eliquis following.   4. Dyslipidemia LDL 7, triglycerides 203 (? Accurate draw) Continue Atorvastatin 80mg .   Fenofibrate stopped given renal dysfunction. Could consider Vascepa for triglyceride management, consider repeat as outpatient.   5. Hypertension Patient with somewhat labile BP this admission. Stable today. Typically requires Midodrine to support BP for dialysis.  Continue Coreg 12.5mg  BID. If low BP becomes a bigger issue, consider transitioning to Toprol XL.   Per primary team: DM type II ESRD GERD Anemia  For questions or updates, please contact [facility] Please consult [email or web address] for contact info under Cardiology/STEMI.  Signed, Laurann Montana, PA-C 04/14/2023, 9:58 AM

## 2023-04-14 NOTE — Progress Notes (Signed)
 Pt receives out-pt HD at at [facility] on TTS 5:45 am chair time. Will assist as needed.   Olivia Canter Renal Navigator [phone]

## 2023-04-14 NOTE — Progress Notes (Signed)
 Subjective: No current complaints denies chest pain or shortness of breath said tolerated HD yesterday.  Noted for stenting today by cardiology  Objective Vital signs in last 24 hours: Vitals:   04/13/23 1750 04/13/23 1851 04/13/23 1922 04/14/23 0359  BP: (!) 103/47 (!) 105/54 (!) 130/51 (!) 106/52  Pulse: 69 74 72 70  Resp: 15 16 16 18   Temp: 98 F (36.7 C) 98.9 F (37.2 C) 97.9 F (36.6 C) 99.4 F (37.4 C)  TempSrc:  Oral Oral Oral  SpO2: 100% 100% 100% 98%  Weight:      Height:       Weight change: 0 kg  Physical Exam: General: Alert adult male NAD Heart: RRR no MRG Lungs: CTA bilaterally nonlabored breathing room air Abdomen: NABS, soft NTND, no ascites Extremities: No pedal edema Dialysis Access: TDC  OP HD: [facility] TTS 3.5h  B400   85.5kg  1K bath  TDC   Heparin none Micera 30 mcg IV/HD   Venofer  50 mg IV weekly    Problem/Plan:   NSTEMI - cardiology consulted and started on IV heparin drip. LHC done 4/2 w/ stenting x 2. Due for further PCI on today, 4/4.   ESRD - HD TTS. Had HD 4/02 at [facility] prior to transfer to [facility]. HD 2 L UF/03 tolerated  Volume - CXR 3/31 no edema. At dry wt. UF 2 L tolerated 4 /03 HD  Anemia  -Hgb 10.9  Follow.   Metabolic bone disease - CCa in range, cont binders ac and sensipar three times per week. Add on phos.   Nutrition -  renal diet, carb modified. Proximal A-fib-on IV heparin cardiology Eliquis on hold, on beta-blocker therapy rate stable   Lenny Pastel, PA-C [facility] Beeper [phone] 04/14/2023,12:51 PM  LOS: 3 days   Labs: Basic Metabolic Panel: Recent Labs  Lab 04/13/23 0519 04/13/23 1231 04/14/23 0927  NA 132* 129* 133*  K 4.1 3.9 3.8  CL 94* 93* 95*  CO2 21* 19* 23  GLUCOSE 109* 202* 116*  BUN 63* 63* 35*  CREATININE 10.29* 10.90* 7.67*  CALCIUM 9.0 9.0 8.8*  PHOS  --  7.4* 4.9*   Liver Function Tests: Recent Labs  Lab 04/10/23 2210 04/13/23 1231 04/14/23 0927  AST 37  --   --   ALT  25  --   --   ALKPHOS 114  --   --   BILITOT 0.8  --   --   PROT 7.5  --   --   ALBUMIN 3.5 2.6* 2.9*   No results for input(s): "LIPASE", "AMYLASE" in the last 168 hours. No results for input(s): "AMMONIA" in the last 168 hours. CBC: Recent Labs  Lab 04/10/23 2210 04/11/23 0532 04/12/23 0656 04/13/23 0519 04/13/23 1231 04/14/23 0927  WBC 11.5* 10.3 8.6 12.3* 11.5* 13.5*  NEUTROABS 8.6*  --   --   --   --   --   HGB 12.2* 10.6* 10.8* 10.4* 10.7* 10.9*  HCT 36.5* 32.5* 32.2* 31.0* 31.8* 33.4*  MCV 88.6 90.3 89.2 89.1 89.3 89.1  PLT 218 187 172 188 185 204   Cardiac Enzymes: No results for input(s): "CKTOTAL", "CKMB", "CKMBINDEX", "TROPONINI" in the last 168 hours. CBG: Recent Labs  Lab 04/13/23 2010 04/14/23 0008 04/14/23 0415 04/14/23 0752 04/14/23 1147  GLUCAP 93 138* 111* 109* 124*    Studies/Results: No results found. Medications:  sodium chloride 10 mL/hr at 04/14/23 0859   heparin 1,150 Units/hr (04/14/23 0845)  aspirin EC  81 mg Oral Daily   atorvastatin  80 mg Oral QHS   carvedilol  12.5 mg Oral BID   Chlorhexidine Gluconate Cloth  6 each Topical Q0600   cinacalcet  30 mg Oral Q T,Th,Sa-HD   clopidogrel  75 mg Oral Q breakfast   furosemide  40 mg Intravenous Once   gabapentin  300 mg Oral QHS   insulin aspart  0-15 Units Subcutaneous Q4H   insulin glargine-yfgn  12 Units Subcutaneous QHS   midodrine  10 mg Oral Once per day on Tuesday Thursday Saturday   multivitamin  1 tablet Oral Daily   pantoprazole  40 mg Oral Daily   sevelamer carbonate  1,600 mg Oral TID WC   sodium chloride flush  3 mL Intravenous Q12H

## 2023-04-14 NOTE — Interval H&P Note (Signed)
 History and Physical Interval Note:  04/14/2023 2:38 PM  Anthony Bullock  has presented today for surgery, with the diagnosis of cad - planned Staged PCI.  The various methods of treatment have been discussed with the patient and family. After consideration of risks, benefits and other options for treatment, the patient has consented to  Procedure(s): CORONARY STENT INTERVENTION (N/A) as a surgical intervention.  The patient's history has been reviewed, patient examined, no change in status, stable for surgery.  I have reviewed the patient's chart and labs.  Questions were answered to the patient's satisfaction.     Bryan Lemma

## 2023-04-14 NOTE — Progress Notes (Signed)
 PROGRESS NOTE    NILES ESS  UEA:[id number] DOB: 05/13/1960 DOA: 04/10/2023 PCP: Kristian Covey, MD    Brief Narrative:   Anthony Bullock is a 63 y.o. male with past medical history significant for type 2 diabetes mellitus with diabetic nephropathy, neuropathy and vascular complications, ESRD on hemodialysis TTS, hypertension, PVD status post bilateral BKA, dyslipidemia, urolithiasis, NICM missed HD last Sat presented to ED with shortness of breath and chest pain. His troponins peaked to around 16K, cardiology consulted, diagnosed him with NSTEMI, started on IV heparin drip and transferred to [facility] for cardiac cath. Nephrology consulted and he underwent HD at [facility] on 4/1 prior to transfer. S/p cardiac cath 4/2 with stenting of LAD and first diagonal. Plan for staged PCI on today.  Assessment & Plan:   NSTEMI/severe three-vessel obstructive CAD Patient presenting with shortness of breath, chest pain.  Missed HD last Saturday.  On presentation troponin elevated 176 and peaked at 16,311 and down trended to 11,346.  TTE with LVEF 40%.  Cardiology consulted and patient transferred to [facility] following hemodialysis at [facility] prior to transfer.  Patient underwent cardiac catheterization on 4/2 with stenting of LAD and first diagonal with plan for staged PCI today. -- LHC for staged PCI today -- As per cardiology, recommend triple therapy x 1 month then continue apixaban and Plavix for 12 months, continue atorvastatin.  ESRD on hemodialysis TTS -- Nephrology following for continued HD while inpatient --Continue midodrine 10mg  3 times weekly on dialysis days TTS.  Essential hypertension -- Continue carvedilol 12.5 g p.o. twice daily -- On midodrine on dialysis days   Anemia of ESRD Stable.   Hyponatremia Stable, 133   Dyslipidemia Lipid file with total cholesterol 78, HDL 30, LDL 7, triglycerides 203. - continue atorvastatin 80 mg p.o. daily --  Fenofibrate DC'd per cardiology given renal dysfunction, consider Vascepa for triglyceride management outpatient   Type 2 diabetes mellitus with peripheral neuropathy  Hemoglobin A1c 6.6 on 03/20/2023, well-controlled.  Home regimen includes 12 units subcutaneously daily, Humalog sliding scale with meals. -- Semglee 12 units [place] daily -- Moderate SSI for coverage -- CBG every 4 hours  Paroxysmal atrial fibrillation  - Holding Eliquis for heart catheterization; currently on heparin drip - Carvedilol 12.5 mg p.o. twice daily  Pressure Injury 04/11/23 Buttocks Right;Left Stage 2 -  Partial thickness loss of dermis presenting as a shallow open injury with a red, pink wound bed without slough. nonblanchable area what appears to be a healing stage 2 with dry flacky skin covering (Active)  04/11/23 1916  Location: Buttocks  Location Orientation: Right;Left  Staging: Stage 2 -  Partial thickness loss of dermis presenting as a shallow open injury with a red, pink wound bed without slough.  Wound Description (Comments): nonblanchable area what appears to be a healing stage 2 with dry flacky skin covering  Present on Admission: Yes  Seen by wound RN, continue offloading, local wound care     DVT prophylaxis: Heparin drip    Code Status: Full Code Family Communication: Updated family present at bedside this morning  Disposition Plan:  Level of care: Progressive Status is: Inpatient Remains inpatient appropriate because: Heparin drip, pending heart catheterization today    Consultants:  Cardiology Nephrology  Procedures:  TTE Left heart catheterization 4/2 Left heart catheterization pending today  Antimicrobials:  None   Subjective: Patient seen examined bedside, lying in bed.  Spouse present.  Awaiting staged heart catheterization this afternoon.  No complaints, questions or concerns at this time.  Denies headache, no visual changes, no chest pain, no palpitations, no shortness of  breath, no abdominal pain, no fever/chills/night sweats, no nausea/vomiting/diarrhea, no focal weakness, no fatigue, no paresthesia.  No acute events overnight per nursing.  Objective: Vitals:   04/13/23 1851 04/13/23 1922 04/14/23 0359 04/14/23 1456  BP: (!) 105/54 (!) 130/51 (!) 106/52   Pulse: 74 72 70   Resp: 16 16 18    Temp: 98.9 F (37.2 C) 97.9 F (36.6 C) 99.4 F (37.4 C)   TempSrc: Oral Oral Oral   SpO2: 100% 100% 98% 100%  Weight:      Height:        Intake/Output Summary (Last 24 hours) at 04/14/2023 1700 Last data filed at 04/14/2023 2440 Gross per 24 hour  Intake 433.3 ml  Output 2000 ml  Net -1566.7 ml   Filed Weights   04/11/23 1936 04/13/23 0315 04/13/23 1400  Weight: 86 kg 84.5 kg 84.5 kg    Examination:  Physical Exam: GEN: NAD, alert and oriented x 3, chronically ill appearance HEENT: NCAT, PERRL, EOMI, sclera clear, MMM PULM: CTAB w/o wheezes/crackles, normal respiratory effort, on room air CV: RRR w/o M/G/R GI: abd soft, NTND, NABS, no R/G/M MSK: Noted bilateral BKA NEURO: CN II-XII intact, no focal deficits, sensation to light touch intact PSYCH: normal mood/affect Integumentary: No concerning rashes/lesions/wounds noted on exposed skin surfaces    Data Reviewed: I have personally reviewed following labs and imaging studies  CBC: Recent Labs  Lab 04/10/23 2210 04/11/23 0532 04/12/23 0656 04/13/23 0519 04/13/23 1231 04/14/23 0927  WBC 11.5* 10.3 8.6 12.3* 11.5* 13.5*  NEUTROABS 8.6*  --   --   --   --   --   HGB 12.2* 10.6* 10.8* 10.4* 10.7* 10.9*  HCT 36.5* 32.5* 32.2* 31.0* 31.8* 33.4*  MCV 88.6 90.3 89.2 89.1 89.3 89.1  PLT 218 187 172 188 185 204   Basic Metabolic Panel: Recent Labs  Lab 04/11/23 0505 04/12/23 0656 04/13/23 0519 04/13/23 1231 04/14/23 0927  NA 129* 131* 132* 129* 133*  K 3.8 3.7 4.1 3.9 3.8  CL 95* 95* 94* 93* 95*  CO2 19* 23 21* 19* 23  GLUCOSE 120* 190* 109* 202* 116*  BUN 83* 49* 63* 63* 35*   CREATININE 10.82* 8.10* 10.29* 10.90* 7.67*  CALCIUM 8.4* 8.5* 9.0 9.0 8.8*  PHOS  --   --   --  7.4* 4.9*   GFR: Estimated Creatinine Clearance: 10.4 mL/min (A) (by C-G formula based on SCr of 7.67 mg/dL (H)). Liver Function Tests: Recent Labs  Lab 04/10/23 2210 04/13/23 1231 04/14/23 0927  AST 37  --   --   ALT 25  --   --   ALKPHOS 114  --   --   BILITOT 0.8  --   --   PROT 7.5  --   --   ALBUMIN 3.5 2.6* 2.9*   No results for input(s): "LIPASE", "AMYLASE" in the last 168 hours. No results for input(s): "AMMONIA" in the last 168 hours. Coagulation Profile: Recent Labs  Lab 04/11/23 0505  INR 1.4*   Cardiac Enzymes: No results for input(s): "CKTOTAL", "CKMB", "CKMBINDEX", "TROPONINI" in the last 168 hours. BNP (last 3 results) No results for input(s): "PROBNP" in the last 8760 hours. HbA1C: No results for input(s): "HGBA1C" in the last 72 hours. CBG: Recent Labs  Lab 04/13/23 2010 04/14/23 0008 04/14/23 0415 04/14/23 0752 04/14/23 1147  GLUCAP 93  138* 111* 109* 124*   Lipid Profile: No results for input(s): "CHOL", "HDL", "LDLCALC", "TRIG", "CHOLHDL", "LDLDIRECT" in the last 72 hours. Thyroid Function Tests: No results for input(s): "TSH", "T4TOTAL", "FREET4", "T3FREE", "THYROIDAB" in the last 72 hours. Anemia Panel: No results for input(s): "VITAMINB12", "FOLATE", "FERRITIN", "TIBC", "IRON", "RETICCTPCT" in the last 72 hours. Sepsis Labs: No results for input(s): "PROCALCITON", "LATICACIDVEN" in the last 168 hours.  No results found for this or any previous visit (from the past 240 hours).       Radiology Studies: No results found.      Scheduled Meds:  [MAR Hold] aspirin EC  81 mg Oral Daily   [MAR Hold] atorvastatin  80 mg Oral QHS   [MAR Hold] carvedilol  12.5 mg Oral BID   [MAR Hold] Chlorhexidine Gluconate Cloth  6 each Topical Q0600   [MAR Hold] Chlorhexidine Gluconate Cloth  6 each Topical Q0600   [MAR Hold] cinacalcet  30 mg Oral Q  T,Th,Sa-HD   [MAR Hold] clopidogrel  75 mg Oral Q breakfast   [MAR Hold] furosemide  40 mg Intravenous Once   [MAR Hold] gabapentin  300 mg Oral QHS   [MAR Hold] insulin aspart  0-15 Units Subcutaneous Q4H   [MAR Hold] insulin glargine-yfgn  12 Units Subcutaneous QHS   [MAR Hold] midodrine  10 mg Oral Once per day on Tuesday Thursday Saturday   [facility] Hold] multivitamin  1 tablet Oral Daily   nitroGLYCERIN       [MAR Hold] pantoprazole  40 mg Oral Daily   [MAR Hold] sevelamer carbonate  1,600 mg Oral TID WC   [MAR Hold] sodium chloride flush  3 mL Intravenous Q12H   Continuous Infusions:  sodium chloride 10 mL/hr at 04/14/23 0859   heparin 1,150 Units/hr (04/14/23 0845)     LOS: 3 days    Time spent: 47 minutes spent on 04/14/2023 caring for this patient face-to-face including chart review, ordering labs/tests, documenting, discussion with nursing staff, consultants, updating family and interview/physical exam    Alvira Philips [place], DO [facility] Available via Epic secure chat 7am-7pm After these hours, please refer to coverage provider listed on [email or web address] 04/14/2023, 5:00 PM

## 2023-04-14 NOTE — Progress Notes (Signed)
 Discussed with pt MI, stents, NTG, Plavix importance, walking as able and CRPII. Pt receptive. He is limited in ambulation. Will refer to [facility] CRP[facility] however he sts it would be too much on his wife to get him there 3 days a week on top of HD the other days. Defer diet ed to his RD at HD center. Will sign off.  [phone] Ethelda Chick BS, ACSM-CEP 04/14/2023 9:22 AM

## 2023-04-14 NOTE — Progress Notes (Signed)
 PHARMACY - ANTICOAGULATION CONSULT NOTE  Pharmacy Consult for heparin Indication: chest pain/ACS  Allergies  Allergen Reactions   Epoetin Alfa Other (See Comments)    Unknown    Ferumoxytol Other (See Comments)    Unknown    Morphine Sulfate Rash and Other (See Comments)    Itches all over, red spots    Patient Measurements: Height: 5\' 10"  (177.8 cm) Weight: 84.5 kg (186 lb 4.6 oz) IBW/kg (Calculated) : 73 HEPARIN DW (KG): 92.1  Vital Signs: Temp: 99.4 F (37.4 C) (04/04 0359) Temp Source: Oral (04/04 0359) BP: 106/52 (04/04 0359) Pulse Rate: 70 (04/04 0359)  Labs: Recent Labs    04/11/23 2138 04/12/23 0656 04/12/23 0656 04/12/23 0817 04/13/23 0519 04/13/23 1231 04/13/23 2148 04/14/23 0927 04/14/23 1023  HGB  --  10.8*   < >  --  10.4* 10.7*  --  10.9*  --   HCT  --  32.2*   < >  --  31.0* 31.8*  --  33.4*  --   PLT  --  172   < >  --  188 185  --  204  --   APTT 120* 71*  --   --   --   --  55*  --   --   HEPARINUNFRC  --  >1.10*  --   --   --   --  0.47  --  0.55  CREATININE  --  8.10*  --   --  10.29* 10.90*  --   --   --   TROPONINIHS  --  12,078*  --  11,346*  --   --   --   --   --    < > = values in this interval not displayed.    Estimated Creatinine Clearance: 7.3 mL/min (A) (by C-G formula based on SCr of 10.9 mg/dL (H)).  Medications:  -Eliquis 5mg  PO BID   Assessment: 62 yoM presented to [facility] with shortness of breath. PMH includes: CHF, COPD, diabetes, ESRD, HLD, HTN, bilateral BKA, and paroxysmal afib (PTA eliquis). Pharmacy consulted to dose heparin for ACS given continued elevated troponins.  -Last dose of eliquis: 3/31 ~2000 (will utilize aPTTs for monitoring). Hgb 10s, plt 188. No s/sx of bleeding. Underwent cath 4/2 with DES to LAD/1st diag - plan for staged PCI to RCA still so restarting heparin while awaiting procedure.  Heparin level came back therapeutic at 0.55 - aPTT also therapeutic at 92, on heparin infusion at 1150 units/hr. Hgb  10.9, plt 204. No s/sx of bleeding or infusion issues.    Goal of Therapy:  Heparin level 0.3-0.7 units/ml aPTT 66-102 seconds Monitor platelets by anticoagulation protocol: Yes   Plan:  -Continue heparin infusion at 1150 units/hr  -Monitor daily HL, CBC, and for s/sx of bleeding  -F/u after cath for DOAC restart   Thank you for allowing pharmacy to participate in this patient's care,  Loralee Pacas, PharmD, BCPS Clinical Pharmacist  Phone: [phone] 04/14/2023 11:28 AM  Please check AMION for all [facility] phone numbers After 10:00 PM, call Main Pharmacy [phone]

## 2023-04-14 NOTE — Progress Notes (Addendum)
 Patient seen and examined, note reviewed with the signed Advanced Practice Provider. I personally reviewed laboratory data, imaging studies and relevant notes. I independently examined the patient and formulated the important aspects of the plan. I have personally discussed the plan with the patient and/or family. Comments or changes to the note/plan are indicated below.  No acute events overnight, no new concerns. Pending staged PCI today. Exam unchanged except for small superfical skin tear over R femoral groin site, no oozing but sequelae of recent bleeding.  Anticipate staged PCI today, will watch overnight and likely will be ok to discharge from cardiac perspective tomorrow if procedure goes well. Ultimate timing of discharge depending on dialysis plan. Agree with note as otherwise documented.  Jodelle Red, MD, PhD, [facility]  [facility]  [place]  [facility] at [facility] at [facility] [address] [place], [place] [address] [phone]       Progress Note  Patient Name: Anthony Bullock Date of Encounter: 04/14/2023  Primary Cardiologist: Dina Rich, MD  Subjective   Feeling well without complaint. No CP or SOB.  Inpatient Medications    Scheduled Meds:  aspirin EC  81 mg Oral Daily   atorvastatin  80 mg Oral QHS   carvedilol  12.5 mg Oral BID   Chlorhexidine Gluconate Cloth  6 each Topical Q0600   cinacalcet  30 mg Oral Q T,Th,Sa-HD   clopidogrel  75 mg Oral Q breakfast   furosemide  40 mg Intravenous Once   gabapentin  300 mg Oral QHS   insulin aspart  0-15 Units Subcutaneous Q4H   insulin glargine-yfgn  12 Units Subcutaneous QHS   midodrine  10 mg Oral Once per day on Tuesday Thursday Saturday   multivitamin  1 tablet Oral Daily   pantoprazole  40 mg Oral Daily   sevelamer carbonate  1,600 mg Oral TID WC   sodium chloride flush  3 mL Intravenous Q12H   Continuous Infusions:  sodium chloride  10 mL/hr at 04/14/23 0859   heparin 1,150 Units/hr (04/14/23 0845)   PRN Meds: acetaminophen, ALPRAZolam, fentaNYL (SUBLIMAZE) injection, magnesium hydroxide, nitroGLYCERIN, olopatadine, ondansetron (ZOFRAN) IV, sodium chloride flush, traZODone   Vital Signs    Vitals:   04/13/23 1750 04/13/23 1851 04/13/23 1922 04/14/23 0359  BP: (!) 103/47 (!) 105/54 (!) 130/51 (!) 106/52  Pulse: 69 74 72 70  Resp: 15 16 16 18   Temp: 98 F (36.7 C) 98.9 F (37.2 C) 97.9 F (36.6 C) 99.4 F (37.4 C)  TempSrc:  Oral Oral Oral  SpO2: 100% 100% 100% 98%  Weight:      Height:        Intake/Output Summary (Last 24 hours) at 04/14/2023 0958 Last data filed at 04/14/2023 2536 Gross per 24 hour  Intake 433.3 ml  Output 2000 ml  Net -1566.7 ml      04/13/2023    2:00 PM 04/13/2023    3:15 AM 04/11/2023    7:36 PM  Last 3 Weights  Weight (lbs) 186 lb 4.6 oz 186 lb 4.6 oz 189 lb 9.5 oz  Weight (kg) 84.5 kg 84.5 kg 86 kg     Telemetry    NSR - Personally Reviewed  ECG    No new tracings - Personally Reviewed  Physical Exam   GEN: No acute distress.  HEENT: Normocephalic, atraumatic, sclera non-icteric. Neck: No JVD or bruits. Cardiac: RRR no murmurs, rubs, or gallops.  Respiratory: Clear to auscultation bilaterally.  Breathing is unlabored. GI: Soft, nontender, non-distended, BS +x 4. MS: no deformity. Extremities: No clubbing or cyanosis. S/p bilateral BKA. Right groin cath site without hematoma, ecchymosis, or bruit. There was a tiny skin tear formed from where the gauze had adhered to old blood on his skin and pulled this. There was scant blotting of fresh blood at the small skin tear upon initial reveal, improved with observation. No frank oozing. Neuro:  AAOx3. Follows commands. Psych:  Responds to questions appropriately with a normal affect.  Labs    High Sensitivity Troponin:   Recent Labs  Lab 04/11/23 0505 04/11/23 0800 04/11/23 0933 04/12/23 0656 04/12/23 0817  TROPONINIHS  9,544* 15,002* 16,311* 12,078* 11,346*      Cardiac EnzymesNo results for input(s): "TROPONINI" in the last 168 hours. No results for input(s): "TROPIPOC" in the last 168 hours.   Chemistry Recent Labs  Lab 04/10/23 2210 04/11/23 0505 04/12/23 0656 04/13/23 0519 04/13/23 1231  NA 130*   < > 131* 132* 129*  K 4.0   < > 3.7 4.1 3.9  CL 92*   < > 95* 94* 93*  CO2 20*   < > 23 21* 19*  GLUCOSE 138*   < > 190* 109* 202*  BUN 78*   < > 49* 63* 63*  CREATININE 10.44*   < > 8.10* 10.29* 10.90*  CALCIUM 8.7*   < > 8.5* 9.0 9.0  PROT 7.5  --   --   --   --   ALBUMIN 3.5  --   --   --  2.6*  AST 37  --   --   --   --   ALT 25  --   --   --   --   ALKPHOS 114  --   --   --   --   BILITOT 0.8  --   --   --   --   GFRNONAA 5*   < > 7* 5* 5*  ANIONGAP 18*   < > 13 17* 17*   < > = values in this interval not displayed.     Hematology Recent Labs  Lab 04/12/23 0656 04/13/23 0519 04/13/23 1231  WBC 8.6 12.3* 11.5*  RBC 3.61* 3.48* 3.56*  HGB 10.8* 10.4* 10.7*  HCT 32.2* 31.0* 31.8*  MCV 89.2 89.1 89.3  MCH 29.9 29.9 30.1  MCHC 33.5 33.5 33.6  RDW 14.4 14.6 14.6  PLT 172 188 185    BNPNo results for input(s): "BNP", "PROBNP" in the last 168 hours.   DDimer No results for input(s): "DDIMER" in the last 168 hours.   Radiology    No results found.  Cardiac Studies   Cath 04/12/23      Ost RCA lesion is 80% stenosed.   Prox RCA to Mid RCA lesion is 80% stenosed.   Ost LM lesion is 45% stenosed.   Prox LAD to Mid LAD lesion is 85% stenosed.   1st Diag lesion is 95% stenosed.   1st Mrg lesion is 100% stenosed.   Dist Cx-1 lesion is 85% stenosed.   Dist Cx-2 lesion is 100% stenosed.   A drug-eluting stent was successfully placed using a SYNERGY XD 2.50X12 in the side branch.   A drug-eluting stent was successfully placed using a SYNERGY XD 2.50X32 in the main branch.   Angioplasty was performed in the main branch. .   Angioplasty was performed in the side branch. .    Post intervention, there is a  0% residual stenosis.   Post intervention, there is a 0% residual stenosis.   LV end diastolic pressure is normal.   Recommend to resume Apixaban, at currently prescribed dose and frequency.   Recommend concurrent antiplatelet therapy of Aspirin 81 mg for 1 month and Clopidogrel 75mg  daily for 12 months .   Severe 3 vessel obstructive CAD. Suspect culprit vessel is the first OM which is occluded Normal LVEDP Successful bifurcation stenting of the LAD/first diagonal with 2.5 x 12 mm Synergy stent in the diagonal and 2.5 x 32 mm Synergy stent in the LAD   Plan: DAPT with ASA 81 mg daily for one month. Plavix 75 mg daily for 12 months for ACS. Anticipate resuming Eliquis once procedures are completed. Recommend return for staged PCI of the RCA at the ostium and mid vessel. Would treat LCx disease medically.    2D echo 04/11/23   1. Left ventricular ejection fraction, by estimation, is 40%. The left  ventricle has mildly decreased function. The left ventricle demonstrates  global hypokinesis. There is moderate left ventricular hypertrophy. Left  ventricular diastolic parameters are   consistent with Grade I diastolic dysfunction (impaired relaxation).  Elevated left atrial pressure.   2. Right ventricular systolic function is normal. The right ventricular  size is normal.   3. The mitral valve is normal in structure. No evidence of mitral valve  regurgitation. No evidence of mitral stenosis.   4. The aortic valve is tricuspid. Aortic valve regurgitation is not  visualized. No aortic stenosis is present.   5. The inferior vena cava is normal in size with greater than 50%  respiratory variability, suggesting right atrial pressure of 3 mmHg.   Patient Profile     62 y.o. male with chronic HFrEF/NICM s/p Bos Sci S-ICD 06/2013 subsequently left in place after battery ran out, mostly nonobstructive CAD by cath 2015 (diffuse diabetic vasculopathy managed medically),  ESRD on HD, HTN then issues with hypotension, HLD, DM2, mild carotid disease (1-39% BICA 12/2022), LBBB, moderate MR by echo 08/2022, osteomyelitis, bilateral BKA, prior gangrenous wound, anemia, arthritis, kidney stone. He has had multiple recurrent admissions for issues with respiratory failure, volume overload, PND, previous COVID-19, complicated at times by AF RVR requiring IV amiodarone and mildly elevated troponins. He presented to [facility] this admission with chest pain and NSTEMI.  Assessment & Plan    1. NSTEMI/CAD [facility] with substernal chest pain, 8/10. Has had this type of chest pain before. hsTN 176 >>2147>>9,544 >>15,002. Admission ECG with sinus rhythm, chronic LBBB. PCI on 04/02 showed severe 3 vessel CAD. Successful bifurcation stenting of the LAD/first diagonal with 2.5 x 12 mm Synergy stent in the diagonal and 2.5 x 32 mm Synergy stent in the LAD. Ost RCA 80% stenosed, prox to mid RCA 80%. Plan staged PCI of RCA  with Dr. Herbie Baltimore today. Labs pending at this time. Continue high dose statin, Coreg 12.5mg  BID Plavix 75mg  added with plan to stop ASA after 1 month of triple therapy There was a tiny skin tear formed from where the previous cath site gauze had adhered to old blood on his skin and pulled his skin. There was scant blotting of fresh blood at the small skin tear upon initial reveal, improved with observation and re-blotting. No frank oozing. Reviewed cath site with MD, felt stable, continue to observe after second procedure as well  Informed Consent   Shared Decision Making/Informed Consent The risks [stroke (1 in 1000), death (1  in 1000), kidney failure [usually temporary] (1 in 500), bleeding (1 in 200), allergic reaction [possibly serious] (1 in 200)], benefits (diagnostic support and management of coronary artery disease) and alternatives of a cardiac catheterization were discussed in detail with Mr. Schreiner and he is willing to proceed.      2. Chronic  HFmrEF Patient with LVEF 40% on 11/29/22 TTE. Repeat US this admission with stable LVEF 40% with global hypokinesis.  GDMT limited by ESRD status. Continue Coreg 12.5mg  BID as BP tolerates Volume management by nephrology/dialysis   3. Paroxysmal atrial fibrillation Sinus rhythm on ECG this admission. Ventricular rates remain well controlled.  Continue Coreg 12.5mg  BID Continue Heparin bridging pending staged PCI. Resume Eliquis following.   4. Dyslipidemia LDL 7, triglycerides 203 (? Accurate draw) Continue Atorvastatin 80mg .   Fenofibrate stopped given renal dysfunction. Could consider Vascepa for triglyceride management, consider repeat as outpatient.   5. Hypertension Patient with somewhat labile BP this admission. Stable today. Typically requires Midodrine to support BP for dialysis.  Continue Coreg 12.5mg  BID. If low BP becomes a bigger issue, consider transitioning to Toprol XL.   Per primary team: DM type II ESRD GERD Anemia  For questions or updates, please contact [facility] Please consult [email or web address] for contact info under Cardiology/STEMI.  Signed, Laurann Montana, PA-C 04/14/2023, 9:58 AM

## 2023-04-14 NOTE — Progress Notes (Signed)
 Site area: R fem artery Site Prior to Removal:  Level 0 Pressure Applied For: Manual: Yes  Patient Status During Pull:  Stable Post Pull Site:  Level 0 Post Pull Instructions Given: Yes Post Pull Pulses Present: R pop 1+ Dressing Applied: gauze w/ tegaderm Bedrest begins @ 2017

## 2023-04-14 NOTE — Progress Notes (Deleted)
 Cardiology Office Note:  .   Date: 12/14/2022 ID:  Anthony Bullock, DOB 13-Oct-1960, MRN [id number] PCP: Kristian Covey, MD  [facility] Providers Cardiologist:  Dina Rich, MD Electrophysiologist:  Sherryl Manges, MD    History of Present Illness: .   Anthony Bullock is a 63 y.o. male with a PMH of chronic systolic CHF, s/p ICD/ NICM, CAD, COPD, PAD, s/p bilateral BKA, hyperlipidemia, hypertension, type 2 diabetes, mild bilateral carotid artery stenosis, ESRD on HD, who presents today for 1 month follow-up.  Echocardiogram in 2015 revealed EF 25%.  Cardiac catheterization in 2015 revealed diffuse nonobstructive disease, worst lesion 80% small distal LAD.  Echocardiogram in EF improved to 50 to 55% in 2017.  Has had some of his heart failure medications lowered due to low BPs on hemodialysis.  Fluid status managed by hemodialysis.  Last seen by Dr. Dina Rich on Jun 02, 2022.  Was overall doing well at that time.  Medical therapy was noted to be limited due to history of low BPs during hemodialysis as well as history of dizziness.  In the interim, he has had several ED visits and admissions. 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 Neurology consulted. Recommended to continue Aspirin, statin, and Plavix, and follow-up with Neuro outpatient. Echo and Cardiac event monitoring also recommended.   Hospital admission 08/18/2022 to 08/26/2022 for acute respiratory failure secondary to volume overload.  Required extra HD sessions.  Hospital course also prolonged secondary to new onset A-fib with new diagnosis of cardiomyopathy with EF 30 to 35%.  Cardiology was consulted.  Was on diltiazem drip temporarily, converted to sinus rhythm.  GDMT limited due to low BPs.  Discharge home on carvedilol and Eliquis.  Readmitted end of August 2024 for acute respiratory failure with hypoxia, multifactorial in  setting of COVID-19 infection, pneumonia, and fluid overload.  BiPAP use to stabilize breathing.  EF 30 to 35% as seen from 08/22/2022.  GDMT limited by low BP.  Hospital course noted by acute A-fib with RVR most likely due to acute infection of COVID and pneumonia, transitioned off amiodarone drip, converted to sinus rhythm.  ED visit 09/24/2022 for shortness of breath.  EMS reported hypoxia with a heart rate 150s initially.  Was placed on CPAP heart rate came down to 110.  Patient denied chest pain.  Chest x-ray did show evidence of volume overload.  Venous gas did show some element of hypoxia. Troponins 43 >> 49. Did require BiPAP in ED, weaned to oxygen via [place]. Stated he was due for dialysis that day and would need dialysis session then.  He arrived back from dialysis, patient discharged back home.  10/11/2022 - Today he presents for follow-up. He states he is doing well. Denies any chest pain, shortness of breath, palpitations, syncope, presyncope, dizziness, orthopnea, PND, swelling or significant weight changes, acute bleeding, or claudication.   12/14/2022 -today presents for follow-up.  He is doing well.  Denies any acute cardiac complaints or issues. Denies any chest pain, shortness of breath, palpitations, syncope, presyncope, dizziness, orthopnea, PND, swelling or significant weight changes, acute bleeding, or claudication.  Tells me he has obtained Kardia mobile device that has consistently been showing normal sinus rhythm.  ROS: Negative. See HPI.   Studies Reviewed: .     Limited echo 11/2022:  1. Left ventricular ejection fraction, by estimation, is 40%. The left  ventricle has mildly decreased  function. The left ventricle demonstrates  global hypokinesis. There is moderate left ventricular hypertrophy. Left  ventricular diastolic parameters are   consistent with Grade I diastolic dysfunction (impaired relaxation).   2. Right ventricular systolic function is normal. The right  ventricular  size is normal.   3. Limited echo to evaluate LV function   Comparison(s): EF 30-35%. Moderate concentric LVH. Moderate mitral regurgitation.  Lexiscan 08/2022:  IMPRESSION: 1. There is a large in size, severe, fixed defect involving the basal inferolateral, mid inferolateral, mid anterolateral and apical lateral segments. This is consistent with infarction involving the left circumflex coronary artery.   2. Globally diminished left ventricular wall motion and left ventricular dilatation.   3. Left ventricular ejection fraction 18%   4. Non invasive risk stratification*: High  Echo 08/2022:  1. Left ventricular ejection fraction, by estimation, is 30 to 35%. The  left ventricle has moderately decreased function. The left ventricle  demonstrates global hypokinesis. There is moderate concentric left  ventricular hypertrophy. Left ventricular  diastolic parameters are consistent with Grade II diastolic dysfunction  (pseudonormalization). Elevated left ventricular end-diastolic pressure.   2. Right ventricular systolic function is normal. The right ventricular  size is normal.   3. Left atrial size was mildly dilated.   4. The mitral valve is normal in structure. Moderate mitral valve  regurgitation. No evidence of mitral stenosis.   5. The aortic valve is normal in structure. Aortic valve regurgitation is  not visualized. Aortic valve sclerosis is present, with no evidence of  aortic valve stenosis.   6. The inferior vena cava is normal in size with greater than 50%  respiratory variability, suggesting right atrial pressure of 3 mmHg.   Carotid duplex 07/2021:  Summary:  Right Carotid: Velocities in the right ICA are consistent with a 1-39%  stenosis.   Left Carotid: Velocities in the left ICA are consistent with a 1-39%  stenosis. Non-hemodynamically significant plaque <50% noted in the CCA. The ECA appears >50% stenosed.   Vertebrals:  Right vertebral artery  demonstrates antegrade flow. Left  vertebral artery demonstrates retrograde flow.  Subclavians: Left subclavian artery flow was disturbed. Normal flow  hemodynamics were seen in the right subclavian artery.  ABI's 10/2019: Summary:  Left: Resting left ankle-brachial index indicates noncompressible left  lower extremity arteries. The left toe-brachial index is abnormal. LT  Great toe pressure = 25 mmHg. Risk Assessment/Calculations:    CHA2DS2-VASc Score = 4   This indicates a 4.8% annual risk of stroke. The patient's score is based upon: CHF History: 1 HTN History: 1 Diabetes History: 1 Stroke History: 0 Vascular Disease History: 1 Age Score: 0 Gender Score: 0   The ASCVD Risk score (Arnett DK, et al., 2019) failed to calculate for the following reasons:   Risk score cannot be calculated because patient has a medical history suggesting prior/existing ASCVD      Physical Exam:   VS:  There were no vitals taken for this visit.   Wt Readings from Last 3 Encounters:  04/13/23 186 lb 4.6 oz (84.5 kg)  03/20/23 206 lb 12.8 oz (93.8 kg)  03/07/23 186 lb 4.6 oz (84.5 kg)    GEN: Well nourished, well developed in no acute distress NECK: No JVD; right carotid bruit, no left carotid bruit CARDIAC: S1/S2, RRR, no murmurs, rubs, gallops RESPIRATORY:  Clear to auscultation without rales, wheezing or rhonchi  ABDOMEN: Soft, non-tender, non-distended EXTREMITIES:  No edema; bilateral BKA  ASSESSMENT AND PLAN: .  Chronic systolic CHF Stage C, NYHA class I symptoms. EF 08/2022 30-35%, reduced in setting of acute illness/A-fib with RVR, limited echo last month revealed EF 40%. GDMT limited d/t soft BP and ESRD. Continue current medication regimen. Euvolemic and well compensated on exam. Weights are stable. Low sodium diet, fluid restriction <2L, and daily weights encouraged. Educated to contact our office for weight gain of 2 lbs overnight or 5 lbs in one week.   CAD Stable with no  anginal symptoms. No indication for ischemic evaluation. Past Lexiscan showed infarct, negative for ischemia. Continue current medication regimen. Heart healthy diet and regular cardiovascular exercise encouraged.   A-fib Denies any recent tachycardia or palpitations. HR well controlled today. Continue Coreg. Continue Eliquis for CHA2DS2-VASc score of 4. Heart healthy diet and regular cardiovascular exercise encouraged.    4. HTN Past issues with low BP's during HD. Denies any recent issues. Continue midodrine. Discussed to monitor BP at home at least 2 hours after medications and sitting for 5-10 minutes.   5. PAD, s/p BKA Previous history of mild carotid artery stenosis by carotid duplex.  Will update carotid duplex at this time. Denies any symptoms.  Continue current medication regimen. Heart healthy diet and regular cardiovascular exercise encouraged.   Dispo: Follow-up with Dr. Dina Rich or APP in 6 months or sooner if anything changes.   Signed, Sharlene Dory, NP

## 2023-04-15 ENCOUNTER — Encounter (HOSPITAL_COMMUNITY): Payer: Self-pay | Admitting: *Deleted

## 2023-04-15 ENCOUNTER — Other Ambulatory Visit (HOSPITAL_COMMUNITY): Payer: Self-pay

## 2023-04-15 DIAGNOSIS — E1142 Type 2 diabetes mellitus with diabetic polyneuropathy: Secondary | ICD-10-CM | POA: Diagnosis not present

## 2023-04-15 DIAGNOSIS — Z955 Presence of coronary angioplasty implant and graft: Secondary | ICD-10-CM | POA: Diagnosis not present

## 2023-04-15 DIAGNOSIS — I214 Non-ST elevation (NSTEMI) myocardial infarction: Secondary | ICD-10-CM | POA: Diagnosis not present

## 2023-04-15 LAB — RENAL FUNCTION PANEL
Albumin: 2.8 g/dL — ABNORMAL LOW (ref 3.5–5.0)
Albumin: 2.8 g/dL — ABNORMAL LOW (ref 3.5–5.0)
Anion gap: 16 — ABNORMAL HIGH (ref 5–15)
Anion gap: 15 (ref 5–15)
BUN: 48 mg/dL — ABNORMAL HIGH (ref 8–23)
BUN: 55 mg/dL — ABNORMAL HIGH (ref 8–23)
CO2: 20 mmol/L — ABNORMAL LOW (ref 22–32)
CO2: 20 mmol/L — ABNORMAL LOW (ref 22–32)
Calcium: 9 mg/dL (ref 8.9–10.3)
Calcium: 8.9 mg/dL (ref 8.9–10.3)
Chloride: 98 mmol/L (ref 98–111)
Chloride: 98 mmol/L (ref 98–111)
Creatinine, Ser: 9.64 mg/dL — ABNORMAL HIGH (ref 0.61–1.24)
Creatinine, Ser: 10.27 mg/dL — ABNORMAL HIGH (ref 0.61–1.24)
GFR, Estimated: 6 mL/min — ABNORMAL LOW (ref >60)
GFR, Estimated: 5 mL/min — ABNORMAL LOW (ref >60)
Glucose, Bld: 108 mg/dL — ABNORMAL HIGH (ref 70–99)
Glucose, Bld: 197 mg/dL — ABNORMAL HIGH (ref 70–99)
Phosphorus: 7.3 mg/dL — ABNORMAL HIGH (ref 2.5–4.6)
Phosphorus: 7 mg/dL — ABNORMAL HIGH (ref 2.5–4.6)
Potassium: 4.2 mmol/L (ref 3.5–5.1)
Potassium: 4.6 mmol/L (ref 3.5–5.1)
Sodium: 134 mmol/L — ABNORMAL LOW (ref 135–145)
Sodium: 133 mmol/L — ABNORMAL LOW (ref 135–145)

## 2023-04-15 LAB — CBC
HCT: 33 % — ABNORMAL LOW (ref 39.0–52.0)
HCT: 32.1 % — ABNORMAL LOW (ref 39.0–52.0)
Hemoglobin: 11 g/dL — ABNORMAL LOW (ref 13.0–17.0)
Hemoglobin: 10.6 g/dL — ABNORMAL LOW (ref 13.0–17.0)
MCH: 29.7 pg (ref 26.0–34.0)
MCH: 29.7 pg (ref 26.0–34.0)
MCHC: 33.3 g/dL (ref 30.0–36.0)
MCHC: 33 g/dL (ref 30.0–36.0)
MCV: 89.2 fL (ref 80.0–100.0)
MCV: 89.9 fL (ref 80.0–100.0)
Platelets: 201 10*3/uL (ref 150–400)
Platelets: 194 10*3/uL (ref 150–400)
RBC: 3.7 MIL/uL — ABNORMAL LOW (ref 4.22–5.81)
RBC: 3.57 MIL/uL — ABNORMAL LOW (ref 4.22–5.81)
RDW: 14.7 % (ref 11.5–15.5)
RDW: 14.6 % (ref 11.5–15.5)
WBC: 10.6 10*3/uL — ABNORMAL HIGH (ref 4.0–10.5)
WBC: 11.8 10*3/uL — ABNORMAL HIGH (ref 4.0–10.5)
nRBC: 0 % (ref 0.0–0.2)
nRBC: 0 % (ref 0.0–0.2)

## 2023-04-15 LAB — GLUCOSE, CAPILLARY
Glucose-Capillary: 108 mg/dL — ABNORMAL HIGH (ref 70–99)
Glucose-Capillary: 135 mg/dL — ABNORMAL HIGH (ref 70–99)
Glucose-Capillary: 170 mg/dL — ABNORMAL HIGH (ref 70–99)
Glucose-Capillary: 240 mg/dL — ABNORMAL HIGH (ref 70–99)
Glucose-Capillary: 84 mg/dL (ref 70–99)

## 2023-04-15 LAB — POCT ACTIVATED CLOTTING TIME
Activated Clotting Time: 348 s
Activated Clotting Time: 302 s
Activated Clotting Time: 204 s

## 2023-04-15 LAB — APTT: aPTT: 36 s (ref 24–36)

## 2023-04-15 MED ORDER — HEPARIN SODIUM (PORCINE) 1000 UNIT/ML IJ SOLN
3800.0000 [IU] | Freq: Once | INTRAMUSCULAR | Status: AC
  Administered 2023-04-15: 3800 [IU]

## 2023-04-15 MED ORDER — PENTAFLUOROPROP-TETRAFLUOROETH EX AERO: 1.0000 | INHALATION_SPRAY | CUTANEOUS | Status: DC | PRN

## 2023-04-15 MED ORDER — ALTEPLASE 2 MG IJ SOLR: 2.0000 mg | Freq: Once | INTRAMUSCULAR | Status: DC | PRN

## 2023-04-15 MED ORDER — HEPARIN SODIUM (PORCINE) 1000 UNIT/ML DIALYSIS: 1000.0000 [IU] | INTRAMUSCULAR | Status: DC | PRN

## 2023-04-15 MED ORDER — CLOPIDOGREL BISULFATE 75 MG PO TABS
75.0000 mg | ORAL_TABLET | Freq: Every day | ORAL | 2 refills | Status: DC
  Filled 2023-04-15: qty 30, 30d supply, fill #0

## 2023-04-15 MED ORDER — LIDOCAINE HCL (PF) 1 % IJ SOLN: 5.0000 mL | INTRAMUSCULAR | Status: DC | PRN

## 2023-04-15 MED ORDER — GABAPENTIN 300 MG PO CAPS
300.0000 mg | ORAL_CAPSULE | Freq: Every day | ORAL | 2 refills | Status: DC
  Filled 2023-04-15: qty 30, 30d supply, fill #0

## 2023-04-15 MED ORDER — ASPIRIN 81 MG PO TBEC
81.0000 mg | DELAYED_RELEASE_TABLET | Freq: Every day | ORAL | 2 refills | Status: DC
  Filled 2023-04-15: qty 30, 30d supply, fill #0

## 2023-04-15 MED ORDER — LIDOCAINE-PRILOCAINE 2.5-2.5 % EX CREA: 1.0000 | TOPICAL_CREAM | CUTANEOUS | Status: DC | PRN

## 2023-04-15 MED ORDER — ANTICOAGULANT SODIUM CITRATE 4% (200MG/5ML) IV SOLN: 5.0000 mL | Status: DC | PRN

## 2023-04-15 MED ORDER — CHLORHEXIDINE GLUCONATE CLOTH 2 % EX PADS
6.0000 | MEDICATED_PAD | Freq: Every day | CUTANEOUS | Status: DC
  Administered 2023-04-15: 6 via TOPICAL

## 2023-04-15 NOTE — Progress Notes (Signed)
 Progress Note  Patient Name: Anthony Bullock Date of Encounter: 04/15/2023  Primary Cardiologist: Dina Rich, MD  Subjective   No complaints overnight.  No chest pain. Wants to go home. Underwent PCI yesterday of the RCA with shockwave lithotripsy and DES placement x 2.  H/H stable at 11/33.  Inpatient Medications    Scheduled Meds:  apixaban  5 mg Oral BID   aspirin EC  81 mg Oral Daily   atorvastatin  80 mg Oral QHS   carvedilol  12.5 mg Oral BID   Chlorhexidine Gluconate Cloth  6 each Topical Q0600   Chlorhexidine Gluconate Cloth  6 each Topical Q0600   cinacalcet  30 mg Oral Q T,Th,Sa-HD   clopidogrel  75 mg Oral Q breakfast   furosemide  40 mg Intravenous Once   gabapentin  300 mg Oral QHS   insulin aspart  0-15 Units Subcutaneous Q4H   insulin glargine-yfgn  12 Units Subcutaneous QHS   midodrine  10 mg Oral Once per day on Tuesday Thursday Saturday   multivitamin  1 tablet Oral Daily   pantoprazole  40 mg Oral Daily   sevelamer carbonate  1,600 mg Oral TID WC   sodium chloride flush  3 mL Intravenous Q12H   sodium chloride flush  3 mL Intravenous Q12H   Continuous Infusions:  sodium chloride 10 mL/hr at 04/14/23 0859   sodium chloride     PRN Meds: sodium chloride, acetaminophen, ALPRAZolam, fentaNYL (SUBLIMAZE) injection, HYDROmorphone (DILAUDID) injection, magnesium hydroxide, nitroGLYCERIN, olopatadine, ondansetron (ZOFRAN) IV, sodium chloride flush, sodium chloride flush, traZODone   Vital Signs    Vitals:   04/14/23 2049 04/14/23 2057 04/15/23 0004 04/15/23 0412  BP: (!) 137/56 (!) 142/52 (!) 132/53 (!) 136/52  Pulse: 77 74 78 77  Resp: 18  18 18   Temp: 98.5 F (36.9 C)  99 F (37.2 C) 98.1 F (36.7 C)  TempSrc: Oral  Oral Oral  SpO2: 100% 100% 100% 99%  Weight:      Height:        Intake/Output Summary (Last 24 hours) at 04/15/2023 0854 Last data filed at 04/15/2023 0800 Gross per 24 hour  Intake 532.83 ml  Output --  Net 532.83 ml       04/13/2023    2:00 PM 04/13/2023    3:15 AM 04/11/2023    7:36 PM  Last 3 Weights  Weight (lbs) 186 lb 4.6 oz 186 lb 4.6 oz 189 lb 9.5 oz  Weight (kg) 84.5 kg 84.5 kg 86 kg     Telemetry    Sinus rhythm with IVCD - Personally Reviewed  ECG    N/A  Physical Exam   General appearance: alert and no distress Lungs: clear to auscultation bilaterally Heart: regular rate and rhythm Extremities: extremities normal, atraumatic, no cyanosis or edema and right groin cath site without hematoma, ecchymosis or bruit, bilateral BKA's Neurologic: Grossly normal  Labs    High Sensitivity Troponin:   Recent Labs  Lab 04/11/23 0505 04/11/23 0800 04/11/23 0933 04/12/23 0656 04/12/23 0817  TROPONINIHS 9,544* 15,002* 16,311* 12,078* 11,346*      Cardiac EnzymesNo results for input(s): "TROPONINI" in the last 168 hours. No results for input(s): "TROPIPOC" in the last 168 hours.   Chemistry Recent Labs  Lab 04/10/23 2210 04/11/23 0505 04/13/23 1231 04/14/23 0927 04/15/23 0450  NA 130*   < > 129* 133* 134*  K 4.0   < > 3.9 3.8 4.2  CL 92*   < > 93*  95* 98  CO2 20*   < > 19* 23 20*  GLUCOSE 138*   < > 202* 116* 108*  BUN 78*   < > 63* 35* 48*  CREATININE 10.44*   < > 10.90* 7.67* 9.64*  CALCIUM 8.7*   < > 9.0 8.8* 9.0  PROT 7.5  --   --   --   --   ALBUMIN 3.5  --  2.6* 2.9* 2.8*  AST 37  --   --   --   --   ALT 25  --   --   --   --   ALKPHOS 114  --   --   --   --   BILITOT 0.8  --   --   --   --   GFRNONAA 5*   < > 5* 7* 6*  ANIONGAP 18*   < > 17* 15 16*   < > = values in this interval not displayed.     Hematology Recent Labs  Lab 04/13/23 1231 04/14/23 0927 04/15/23 0450  WBC 11.5* 13.5* 10.6*  RBC 3.56* 3.75* 3.70*  HGB 10.7* 10.9* 11.0*  HCT 31.8* 33.4* 33.0*  MCV 89.3 89.1 89.2  MCH 30.1 29.1 29.7  MCHC 33.6 32.6 33.3  RDW 14.6 14.6 14.7  PLT 185 204 201    BNPNo results for input(s): "BNP", "PROBNP" in the last 168 hours.   DDimer No results for  input(s): "DDIMER" in the last 168 hours.   Radiology    CARDIAC CATHETERIZATION Result Date: 04/14/2023 Images from the original result were not included.   LESION #1: Prox RCA to Mid RCA lesion is 80% stenosed.   Scoring balloon angioplasty was performed using a BALLN SCOREFLEX 3.50X15. Followed by shockwave lithotripsy with a 3.5 mm 12 mm shockwave balloon   A drug-eluting stent was successfully placed using a SYNERGY XD 3.50X28 => postdilated in the majority of stent to 3.8-3.9 mm. Post intervention, there is a 5% residual stenosis in the tightest segment, but the remainder of the stent is 0% stenosed.  TIMI-3 flow maintained   --------------------------------------------   LESION #2 Ost RCA lesion is 80% stenosed.   Scoring balloon angioplasty was performed using a BALLN SCOREFLEX 3.50X15.  Followed by shockwave lithotripsy with a 3.5 mm 12 mm shockwave balloon   A drug-eluting stent was successfully placed using a SYNERGY XD 3.50X16 postdilated from 4.2 to 4.0 mm.  Post intervention, there is a 0% residual stenosis.  TIMI-3 flow maintained Diagnostic  Dominance: Right       Intervention Successful 2 site PCI of the RCA using score flex angioplasty followed by shockwave lithotripsy for lesion modification: Mid RCA 80% reduced to 0% with exception of focal 5% (Synergy XD 3.5 mm x 28 mm postdilated to 3.8 mm); ostial RCA 80% reduced to 0% (Synergy XD 3.5 mm 16mm postdilated in tapered fashion from 4.2 to 3.9 mm) RECOMMENDATIONS   In the absence of any other complications or medical issues, we expect the patient to be ready for discharge from an interventional cardiology perspective on 04/15/2023.   Continue to titrate GDMT   Recommend dual antiplatelet therapy with Aspirin 81mg  daily and Clopidogrel 75mg  daily long-term (beyond 12 months) because of Extensive PCI to both the LAD and RCA.   Would continue DAPT for 1 year and then after 1 year would continue long-term clopidogrel    Cardiac Studies   Cath  04/12/23      Ost RCA lesion is 80%  stenosed.   Prox RCA to Mid RCA lesion is 80% stenosed.   Ost LM lesion is 45% stenosed.   Prox LAD to Mid LAD lesion is 85% stenosed.   1st Diag lesion is 95% stenosed.   1st Mrg lesion is 100% stenosed.   Dist Cx-1 lesion is 85% stenosed.   Dist Cx-2 lesion is 100% stenosed.   A drug-eluting stent was successfully placed using a SYNERGY XD 2.50X12 in the side branch.   A drug-eluting stent was successfully placed using a SYNERGY XD 2.50X32 in the main branch.   Angioplasty was performed in the main branch. .   Angioplasty was performed in the side branch. .   Post intervention, there is a 0% residual stenosis.   Post intervention, there is a 0% residual stenosis.   LV end diastolic pressure is normal.   Recommend to resume Apixaban, at currently prescribed dose and frequency.   Recommend concurrent antiplatelet therapy of Aspirin 81 mg for 1 month and Clopidogrel 75mg  daily for 12 months .   Severe 3 vessel obstructive CAD. Suspect culprit vessel is the first OM which is occluded Normal LVEDP Successful bifurcation stenting of the LAD/first diagonal with 2.5 x 12 mm Synergy stent in the diagonal and 2.5 x 32 mm Synergy stent in the LAD   Plan: DAPT with ASA 81 mg daily for one month. Plavix 75 mg daily for 12 months for ACS. Anticipate resuming Eliquis once procedures are completed. Recommend return for staged PCI of the RCA at the ostium and mid vessel. Would treat LCx disease medically.    2D echo 04/11/23   1. Left ventricular ejection fraction, by estimation, is 40%. The left  ventricle has mildly decreased function. The left ventricle demonstrates  global hypokinesis. There is moderate left ventricular hypertrophy. Left  ventricular diastolic parameters are   consistent with Grade I diastolic dysfunction (impaired relaxation).  Elevated left atrial pressure.   2. Right ventricular systolic function is normal. The right ventricular  size is  normal.   3. The mitral valve is normal in structure. No evidence of mitral valve  regurgitation. No evidence of mitral stenosis.   4. The aortic valve is tricuspid. Aortic valve regurgitation is not  visualized. No aortic stenosis is present.   5. The inferior vena cava is normal in size with greater than 50%  respiratory variability, suggesting right atrial pressure of 3 mmHg.   Patient Profile     62 y.o. male with chronic HFrEF/NICM s/p Bos Sci S-ICD 06/2013 subsequently left in place after battery ran out, mostly nonobstructive CAD by cath 2015 (diffuse diabetic vasculopathy managed medically), ESRD on HD, HTN then issues with hypotension, HLD, DM2, mild carotid disease (1-39% BICA 12/2022), LBBB, moderate MR by echo 08/2022, osteomyelitis, bilateral BKA, prior gangrenous wound, anemia, arthritis, kidney stone. He has had multiple recurrent admissions for issues with respiratory failure, volume overload, PND, previous COVID-19, complicated at times by AF RVR requiring IV amiodarone and mildly elevated troponins. He presented to [facility] this admission with chest pain and NSTEMI.  Assessment & Plan    1. NSTEMI/CAD [facility] with substernal chest pain, 8/10. Has had this type of chest pain before. hsTN 176 >>2147>>9,544 >>15,002. Admission ECG with sinus rhythm, chronic LBBB. PCI on 04/02 showed severe 3 vessel CAD. Successful bifurcation stenting of the LAD/first diagonal with 2.5 x 12 mm Synergy stent in the diagonal and 2.5 x 32 mm Synergy stent in the LAD. Ost RCA  80% stenosed, prox to mid RCA 80%. Successful PCI to the RCA yesterday, feels well today, stable right groin cath site Continue high dose statin, Coreg 12.5mg  BID Plan per Dr. Herbie Baltimore for aspirin and Plavix DAPT for more than 12 months given extensive PCI of the LAD and RCA   2. Chronic HFmrEF Patient with LVEF 40% on 11/29/22 TTE. Repeat US this admission with stable LVEF 40% with global hypokinesis.  GDMT  limited by ESRD status. Continue Coreg 12.5mg  BID as BP tolerates Volume management by nephrology/dialysis, plan for dialysis today   3. Paroxysmal atrial fibrillation Sinus rhythm on ECG this admission. Ventricular rates remain well controlled.  Continue Coreg 12.5mg  BID Eliquis 5 mg BID restarted   4. Dyslipidemia LDL 7, triglycerides 203 (? Accurate draw) Continue Atorvastatin 80mg .   Fenofibrate stopped given renal dysfunction. Could consider Vascepa for triglyceride management, consider repeat as outpatient.   5. Hypertension Patient with somewhat labile BP this admission. Stable today. Typically requires Midodrine to support BP for dialysis.  Continue Coreg 12.5mg  BID. If low BP becomes a bigger issue, consider transitioning to Toprol XL.   Ok to d/c today from a cardiology standpoint. Has follow-up on 6/20 with Dr. Wyline Mood.  [facility] will sign off.   Medication Recommendations:  as above Other recommendations (labs, testing, etc):  none Follow up as an outpatient:  Dr. Garner Nash, MD, [facility], FACP  [place]  [facility]  Medical Director of the [facility] Diplomate of the American Board of Clinical Lipidology Attending Cardiologist  Direct Dial: [phone]  Fax: [phone]  Website:  [email or web address]  Chrystie Nose, MD 04/15/2023, 8:54 AM

## 2023-04-15 NOTE — Progress Notes (Signed)
 Subjective: seen in room no complaints, no chest pain no shortness of breath.  SP PCI of LAD 4/02 and right RCA yest. 4 /04 shockwave lithotripsy and DES placement x 2.  For HD today on schedule  Objective Vital signs in last 24 hours: Vitals:   04/14/23 2049 04/14/23 2057 04/15/23 0004 04/15/23 0412  BP: (!) 137/56 (!) 142/52 (!) 132/53 (!) 136/52  Pulse: 77 74 78 77  Resp: 18  18 18   Temp: 98.5 F (36.9 C)  99 F (37.2 C) 98.1 F (36.7 C)  TempSrc: Oral  Oral Oral  SpO2: 100% 100% 100% 99%  Weight:      Height:       Weight change:   Physical Exam: General: Alert adult male NAD Heart: RRR no MRG Lungs: CTA bilaterally nonlabored breathing room air Abdomen: NABS, soft NTND, no ascites Extremities: No pedal edema Dialysis Access: TDC   OP HD: [facility] TTS 3.5h  B400   85.5kg  1K bath  TDC   Heparin none Micera 30 mcg IV/HD   Venofer  50 mg IV weekly    Problem/Plan:    NSTEMI - cardiology consulted and started on IV heparin drip. LHC done 4/2 w/ stenting LAD and. RCA yest.4 /04 shockwave lithotripsy and DES placement x 2.    ESRD - HD TTS. Had HD 4/02 at [facility] prior to transfer to [facility]. HD 2 L UF/03 tolerated HD today on schedule  Volume - CXR 3/31 no edema. At dry wt. UF 2 L tolerated 4 /03 HD follow-up post wt/bp for any EDW adjustment, noted on midodrine for BPsupport with HD Chronic HFmrEF = ultrasound this admission stable LVEF 40% ejection fraction with global hypokinesis  Anemia  -Hgb 10.9  Follow.   Metabolic bone disease - CCa in range, cont binders ac and sensipar three times per week.  Phosphorus 7.3 Renvela with meals  Nutrition -  renal diet, carb modified. Proximal A-fib-AC per cardiology Eliquis / on beta-blocker therapy rate stable  Lenny Pastel, PA-C [facility] Beeper [phone] 04/15/2023,12:21 PM  LOS: 4 days   Labs: Basic Metabolic Panel: Recent Labs  Lab 04/13/23 1231 04/14/23 0927 04/15/23 0450  NA 129* 133* 134*  K 3.9 3.8  4.2  CL 93* 95* 98  CO2 19* 23 20*  GLUCOSE 202* 116* 108*  BUN 63* 35* 48*  CREATININE 10.90* 7.67* 9.64*  CALCIUM 9.0 8.8* 9.0  PHOS 7.4* 4.9* 7.3*   Liver Function Tests: Recent Labs  Lab 04/10/23 2210 04/13/23 1231 04/14/23 0927 04/15/23 0450  AST 37  --   --   --   ALT 25  --   --   --   ALKPHOS 114  --   --   --   BILITOT 0.8  --   --   --   PROT 7.5  --   --   --   ALBUMIN 3.5 2.6* 2.9* 2.8*   No results for input(s): "LIPASE", "AMYLASE" in the last 168 hours. No results for input(s): "AMMONIA" in the last 168 hours. CBC: Recent Labs  Lab 04/10/23 2210 04/11/23 0532 04/13/23 0519 04/13/23 1231 04/14/23 0927 04/15/23 0450 04/15/23 1141  WBC 11.5*   < > 12.3* 11.5* 13.5* 10.6* 11.8*  NEUTROABS 8.6*  --   --   --   --   --   --   HGB 12.2*   < > 10.4* 10.7* 10.9* 11.0* 10.6*  HCT 36.5*   < > 31.0* 31.8*  33.4* 33.0* 32.1*  MCV 88.6   < > 89.1 89.3 89.1 89.2 89.9  PLT 218   < > 188 185 204 201 194   < > = values in this interval not displayed.   Cardiac Enzymes: No results for input(s): "CKTOTAL", "CKMB", "CKMBINDEX", "TROPONINI" in the last 168 hours. CBG: Recent Labs  Lab 04/14/23 2148 04/15/23 0002 04/15/23 0407 04/15/23 0739 04/15/23 1156  GLUCAP 87 135* 108* 170* 240*    Studies/Results: CARDIAC CATHETERIZATION Result Date: 04/14/2023 Images from the original result were not included.   LESION #1: Prox RCA to Mid RCA lesion is 80% stenosed.   Scoring balloon angioplasty was performed using a BALLN SCOREFLEX 3.50X15. Followed by shockwave lithotripsy with a 3.5 mm 12 mm shockwave balloon   A drug-eluting stent was successfully placed using a SYNERGY XD 3.50X28 => postdilated in the majority of stent to 3.8-3.9 mm. Post intervention, there is a 5% residual stenosis in the tightest segment, but the remainder of the stent is 0% stenosed.  TIMI-3 flow maintained   --------------------------------------------   LESION #2 Ost RCA lesion is 80% stenosed.    Scoring balloon angioplasty was performed using a BALLN SCOREFLEX 3.50X15.  Followed by shockwave lithotripsy with a 3.5 mm 12 mm shockwave balloon   A drug-eluting stent was successfully placed using a SYNERGY XD 3.50X16 postdilated from 4.2 to 4.0 mm.  Post intervention, there is a 0% residual stenosis.  TIMI-3 flow maintained Diagnostic  Dominance: Right       Intervention Successful 2 site PCI of the RCA using score flex angioplasty followed by shockwave lithotripsy for lesion modification: Mid RCA 80% reduced to 0% with exception of focal 5% (Synergy XD 3.5 mm x 28 mm postdilated to 3.8 mm); ostial RCA 80% reduced to 0% (Synergy XD 3.5 mm 16mm postdilated in tapered fashion from 4.2 to 3.9 mm) RECOMMENDATIONS   In the absence of any other complications or medical issues, we expect the patient to be ready for discharge from an interventional cardiology perspective on 04/15/2023.   Continue to titrate GDMT   Recommend dual antiplatelet therapy with Aspirin 81mg  daily and Clopidogrel 75mg  daily long-term (beyond 12 months) because of Extensive PCI to both the LAD and RCA.   Would continue DAPT for 1 year and then after 1 year would continue long-term clopidogrel   Medications:  sodium chloride 10 mL/hr at 04/14/23 0859   sodium chloride     anticoagulant sodium citrate      apixaban  5 mg Oral BID   aspirin EC  81 mg Oral Daily   atorvastatin  80 mg Oral QHS   carvedilol  12.5 mg Oral BID   Chlorhexidine Gluconate Cloth  6 each Topical Q0600   Chlorhexidine Gluconate Cloth  6 each Topical Q0600   cinacalcet  30 mg Oral Q T,Th,Sa-HD   clopidogrel  75 mg Oral Q breakfast   furosemide  40 mg Intravenous Once   gabapentin  300 mg Oral QHS   insulin aspart  0-15 Units Subcutaneous Q4H   insulin glargine-yfgn  12 Units Subcutaneous QHS   midodrine  10 mg Oral Once per day on Tuesday Thursday Saturday   multivitamin  1 tablet Oral Daily   pantoprazole  40 mg Oral Daily   sevelamer carbonate  1,600 mg  Oral TID WC   sodium chloride flush  3 mL Intravenous Q12H   sodium chloride flush  3 mL Intravenous Q12H

## 2023-04-15 NOTE — Progress Notes (Signed)
   04/15/23 1726  Vitals  Temp 97.7 F (36.5 C)  Pulse Rate 71  Resp 12  BP 110/63  SpO2 100 %  O2 Device Room Air  Oxygen Therapy  Patient Activity (if Appropriate) In bed  Pulse Oximetry Type Continuous  Oximetry Probe Site Changed No  During Treatment Monitoring  Blood Flow Rate (mL/min) 399 mL/min  Arterial Pressure (mmHg) -261.2 mmHg  Venous Pressure (mmHg) 213.93 mmHg  TMP (mmHg) -0.4 mmHg  Ultrafiltration Rate (mL/min) 597 mL/min  Dialysate Flow Rate (mL/min) 299 ml/min  Dialysate Potassium Concentration 3  Dialysate Calcium Concentration 2.5  Duration of HD Treatment -hour(s) 3.46 hour(s)  Cumulative Fluid Removed (mL) per Treatment  1477.77  HD Safety Checks Performed Yes  Intra-Hemodialysis Comments Tolerated well;Tx completed   Received patient in bed to unit.  Alert and oriented.  Informed consent signed and in chart.   TX duration: 3.5 hours  Patient tolerated well.  Transported back to the room  Alert, without acute distress.  Hand-off given to patient's nurse.   Access used: RIJ Access issues: None   Total UF removed: 1500 Medication(s) given: None  Mar Daring, LPN  [facility]

## 2023-04-15 NOTE — Discharge Summary (Signed)
 Physician Discharge Summary  Anthony Bullock WGN:[id number] DOB: 19-May-1960  PCP: Kristian Covey, MD  Admitted from: Home Discharged to: Home  Admit date: 04/10/2023 Discharge date: 04/15/2023  Recommendations for Outpatient Follow-up:    Follow-up Information     Kristian Covey, MD. Schedule an appointment as soon as possible for a visit in 1 week(s).   Specialty: Family Medicine Contact information: [address] [place]ena Flake [place] [address] [phone]         Outpatient hemodialysis center ([facility] in [place]) Follow up on 04/18/2023.   Why: Continue your scheduled hemodialysis appointments for Tuesdays, Thursdays and Saturdays.                 Home Health: None    Equipment/Devices: None    Discharge Condition: Improved and stable   Code Status: Full Code Diet recommendation:  Discharge Diet Orders (From admission, onward)     Start     Ordered   04/15/23 0000  Diet - low sodium heart healthy        04/15/23 1300   04/15/23 0000  Diet Carb Modified        04/15/23 1300             Discharge Diagnoses:  Principal Problem:   NSTEMI (non-ST elevated myocardial infarction) (HCC) Active Problems:   Type 2 diabetes mellitus with peripheral neuropathy (HCC)   Paroxysmal atrial fibrillation (HCC)   End-stage renal disease on hemodialysis (HCC)   Dyslipidemia   GERD without esophagitis   Brief Hospital Course:   63 year old male with type 2 diabetes mellitus with diabetic nephropathy, neuropathy and vascular complications, ESRD on hemodialysis TTS, hypertension, PVD status post bilateral BKA, dyslipidemia, urolithiasis, NICM missed HD last Sat presented to ED with shortness of breath and chest pain.  His troponins peaked to around 16K, cardiology consulted, diagnosed him with NSTEMI, started on IV heparin drip and transferred to [facility] for cardiac cath.  Nephrology was consulted for dialysis needs.  He underwent staged coronary PCI on 4/2 and 4/4.      Assessment & Plan:   NSTEMI/severe three-vessel obstructive CAD His troponins peaked to around 16K,  At [facility], he was started on IV heparin, underwent hemodialysis and transferred to [facility] for cardiac cath. 4/2: S/p cardiac cath with stenting of LAD and first diagonal.   4/4: S/p successful PCI to the RCA. As per cardiology follow-up on day of discharge, continue aspirin 81 Mg daily + Plavix 75 Mg daily for more than 12 months given extensive PCI of the LAD and RCA.  Patient also on Eliquis anticoagulation. Echo shows ICM with LVEF of 40%. Postprocedure, Eliquis has been resumed. Cardiology have seen today and have cleared patient for discharge.  He has follow-up appointment with outpatient cardiology.   ESRD on hemodialysis TTS She was consulted for HD needs that was coordinated around his procedures above Postdialysis today, patient to DC with continued outpatient dialysis schedule.   Anemia of ESRD Stable in the 10 g range.   Hyponatremia Stable in the low 130s.   Dyslipidemia Lipid panel as noted above.  Given his renal insufficiency, Cardiology have stopped his fenofibrate and could consider Vascepa as outpatient.   Type 2 diabetes mellitus with peripheral neuropathy  Treated in house with Semglee, change SSI to at mealtime with bedtime scale. Reasonable in-house control. Resume home insulin management at DC.   Paroxysmal atrial fibrillation  In sinus rhythm today. Continue prior home dose of carvedilol and Eliquis.  Pressure Injury 04/11/23 Buttocks Right;Left Stage 2 -  Partial thickness loss of dermis presenting as a shallow open injury with a red, pink wound bed without slough. nonblanchable area what appears to be a healing stage 2 with dry flacky skin covering (Active)  04/11/23 1916  Location: Buttocks  Location Orientation: Right;Left  Staging: Stage 2 -  Partial thickness loss of dermis presenting as a shallow open injury with a red, pink wound bed  without slough.  Wound Description (Comments): nonblanchable area what appears to be a healing stage 2 with dry flacky skin covering  Present on Admission: Yes  Agree with evaluation and management of pressure injury as noted above.       Body mass index is 26.73 kg/m.  Consultants:   Cardiology Nephrology   Procedures:   Cardiac cath with stent 4/2 and 4/4 Hemodialysis   Discharge Instructions  Discharge Instructions     (HEART FAILURE PATIENTS) Call MD:  Anytime you have any of the following symptoms: 1) 3 pound weight gain in 24 hours or 5 pounds in 1 week 2) shortness of breath, with or without a dry hacking cough 3) swelling in the hands, feet or stomach 4) if you have to sleep on extra pillows at night in order to breathe.   Complete by: As directed    AMB Referral to Cardiac Rehabilitation - Phase II   Complete by: As directed    Diagnosis:  NSTEMI Coronary Stents     After initial evaluation and assessments completed: Virtual Based Care may be provided alone or in conjunction with Phase 2 Cardiac Rehab based on patient barriers.: Yes   Intensive Cardiac Rehabilitation (ICR) [facility] location only OR Traditional Cardiac Rehabilitation (TCR) *If criteria for ICR are not met will enroll in TCR [facility] only): Yes   Amb Referral to Cardiac Rehabilitation   Complete by: As directed    Diagnosis:  Coronary Stents NSTEMI PTCA     After initial evaluation and assessments completed: Virtual Based Care may be provided alone or in conjunction with Phase 2 Cardiac Rehab based on patient barriers.: Yes   Intensive Cardiac Rehabilitation (ICR) [facility] location only OR Traditional Cardiac Rehabilitation (TCR) *If criteria for ICR are not met will enroll in TCR [facility] only): Yes   Call MD for:  difficulty breathing, headache or visual disturbances   Complete by: As directed    Call MD for:  extreme fatigue   Complete by: As directed    Call MD for:  persistant dizziness or light-headedness    Complete by: As directed    Call MD for:  severe uncontrolled pain   Complete by: As directed    Diet - low sodium heart healthy   Complete by: As directed    Diet Carb Modified   Complete by: As directed    Increase activity slowly   Complete by: As directed    No wound care   Complete by: As directed         Medication List     STOP taking these medications    fenofibrate 160 MG tablet       TAKE these medications    acetaminophen 325 MG tablet Commonly known as: TYLENOL Take 1-2 tablets (325-650 mg total) by mouth every 4 (four) hours as needed for mild pain.   apixaban 5 MG Tabs tablet Commonly known as: Eliquis Take 1 tablet (5 mg total) by mouth 2 (two) times daily.   aspirin EC 81 MG tablet Take  1 tablet (81 mg total) by mouth daily. Swallow whole.   atorvastatin 80 MG tablet Commonly known as: LIPITOR TAKE 1 TABLET BY MOUTH AT BEDTIME What changed:  how much to take how to take this when to take this additional instructions   BD Pen Needle Nano U/F 32G X 4 MM Misc Generic drug: Insulin Pen Needle USE 1 PEN NEEDLE SUBCUTANEOUSLY WITH INSULIN 4 TIMES DAILY   carvedilol 12.5 MG tablet Commonly known as: COREG Take 1 tablet (12.5 mg total) by mouth 2 (two) times daily.   cinacalcet 30 MG tablet Commonly known as: SENSIPAR Take 1 tablet (30 mg total) by mouth Every Tuesday,Thursday,and Saturday with dialysis.   clopidogrel 75 MG tablet Commonly known as: PLAVIX Take 1 tablet (75 mg total) by mouth daily with breakfast. Start taking on: April 16, 2023   FreeStyle Libre 3 Plus Sensor Misc Change sensor every 15 days. Use as directed to check glucose daily   FreeStyle Libre 3 Reader Devi Use to check blood glucose TID   gabapentin 300 MG capsule Commonly known as: NEURONTIN Take 1 capsule (300 mg total) by mouth at bedtime. What changed:  how much to take how to take this when to take this additional instructions   glucose blood test  strip Check 1 time daily. E11.9 One Touch Ultra Blue Test Strips   HumaLOG KwikPen 200 UNIT/ML KwikPen Generic drug: insulin lispro INJECT A MAXIMUM OF 28 UNITS SUBCUTANEOUSLY TWICE DAILY WITH LUNCH AND SUPPER PER SLIDING SCALE. APPOINTMENT REQUIRED FOR FUTURE REFILLS   Lantus SoloStar 100 UNIT/ML Solostar Pen Generic drug: insulin glargine INJECT 12 UNITS SUBCUTANEOUSLY AT BEDTIME What changed: See the new instructions.   midodrine 10 MG tablet Commonly known as: PROAMATINE Take 10 mg by mouth 3 (three) times a week. EVERY TUESDAY, THURSDAY, AND SATURDAY   multivitamin Tabs tablet Take 1 tablet by mouth once daily   Olopatadine HCl 0.2 % Soln Place 1 drop into both eyes daily as needed (for allergies).   pantoprazole 20 MG tablet Commonly known as: PROTONIX Take 20 mg by mouth daily.   sevelamer 800 MG tablet Commonly known as: RENAGEL Take 1,600 mg by mouth 3 (three) times daily with meals.   Ventolin HFA 108 (90 Base) MCG/ACT inhaler Generic drug: albuterol Inhale 1-2 puffs into the lungs every 6 (six) hours as needed for wheezing or shortness of breath.       Allergies  Allergen Reactions   Epoetin Alfa Other (See Comments)    Unknown    Ferumoxytol Other (See Comments)    Unknown    Morphine Sulfate Rash and Other (See Comments)    Itches all over, red spots      Procedures/Studies: CARDIAC CATHETERIZATION Result Date: 04/14/2023 Images from the original result were not included.   LESION #1: Prox RCA to Mid RCA lesion is 80% stenosed.   Scoring balloon angioplasty was performed using a BALLN SCOREFLEX 3.50X15. Followed by shockwave lithotripsy with a 3.5 mm 12 mm shockwave balloon   A drug-eluting stent was successfully placed using a SYNERGY XD 3.50X28 => postdilated in the majority of stent to 3.8-3.9 mm. Post intervention, there is a 5% residual stenosis in the tightest segment, but the remainder of the stent is 0% stenosed.  TIMI-3 flow maintained    --------------------------------------------   LESION #2 Ost RCA lesion is 80% stenosed.   Scoring balloon angioplasty was performed using a BALLN SCOREFLEX 3.50X15.  Followed by shockwave lithotripsy with a 3.5 mm 12 mm  shockwave balloon   A drug-eluting stent was successfully placed using a SYNERGY XD 3.50X16 postdilated from 4.2 to 4.0 mm.  Post intervention, there is a 0% residual stenosis.  TIMI-3 flow maintained Diagnostic  Dominance: Right       Intervention Successful 2 site PCI of the RCA using score flex angioplasty followed by shockwave lithotripsy for lesion modification: Mid RCA 80% reduced to 0% with exception of focal 5% (Synergy XD 3.5 mm x 28 mm postdilated to 3.8 mm); ostial RCA 80% reduced to 0% (Synergy XD 3.5 mm 16mm postdilated in tapered fashion from 4.2 to 3.9 mm) RECOMMENDATIONS   In the absence of any other complications or medical issues, we expect the patient to be ready for discharge from an interventional cardiology perspective on 04/15/2023.   Continue to titrate GDMT   Recommend dual antiplatelet therapy with Aspirin 81mg  daily and Clopidogrel 75mg  daily long-term (beyond 12 months) because of Extensive PCI to both the LAD and RCA.   Would continue DAPT for 1 year and then after 1 year would continue long-term clopidogrel   CARDIAC CATHETERIZATION Result Date: 04/12/2023   Suezanne Jacquet RCA lesion is 80% stenosed.   Prox RCA to Mid RCA lesion is 80% stenosed.   Ost LM lesion is 45% stenosed.   Prox LAD to Mid LAD lesion is 85% stenosed.   1st Diag lesion is 95% stenosed.   1st Mrg lesion is 100% stenosed.   Dist Cx-1 lesion is 85% stenosed.   Dist Cx-2 lesion is 100% stenosed.   A drug-eluting stent was successfully placed using a SYNERGY XD 2.50X12 in the side branch.   A drug-eluting stent was successfully placed using a SYNERGY XD 2.50X32 in the main branch.   Angioplasty was performed in the main branch. .   Angioplasty was performed in the side branch. .   Post intervention, there is a 0%  residual stenosis.   Post intervention, there is a 0% residual stenosis.   LV end diastolic pressure is normal.   Recommend to resume Apixaban, at currently prescribed dose and frequency.   Recommend concurrent antiplatelet therapy of Aspirin 81 mg for 1 month and Clopidogrel 75mg  daily for 12 months . Severe 3 vessel obstructive CAD. Suspect culprit vessel is the first OM which is occluded Normal LVEDP Successful bifurcation stenting of the LAD/first diagonal with 2.5 x 12 mm Synergy stent in the diagonal and 2.5 x 32 mm Synergy stent in the LAD Plan: DAPT with ASA 81 mg daily for one month. Plavix 75 mg daily for 12 months for ACS. Anticipate resuming Eliquis once procedures are completed. Recommend return for staged PCI of the RCA at the ostium and mid vessel. Would treat LCx disease medically.   ECHOCARDIOGRAM COMPLETE Result Date: 04/11/2023    ECHOCARDIOGRAM REPORT   Patient Name:   Anthony Bullock [facility] Date of Exam: 04/11/2023 Medical Rec #:  [id number]     Height:       70.0 in Accession #:    [id number]    Weight:       207.2 lb Date of Birth:  1960-09-24     BSA:          2.119 m Patient Age:    62 years      BP:           124/41 mmHg Patient Gender: M             HR:  71 bpm. [facility] Procedure: 2D Echo, Cardiac Doppler, Color Doppler and Intracardiac            Opacification Agent (Both Spectral and Color Flow Doppler were            utilized during procedure). Indications:    NSTEMI l21.4  History:        Patient has prior history of Echocardiogram examinations, most                 recent 11/29/2022. CHF and Cardiomyopathy, CAD, COPD; Risk                 Factors:Hypertension, Diabetes, Dyslipidemia and Former Smoker.                 ESRD on dailysis.  Sonographer:    Celesta Gentile RCS Referring Phys: [id number] JAN A MANSY  Sonographer Comments: Suboptimal apical window. IMPRESSIONS  1. Left ventricular ejection fraction, by estimation, is 40%. The left ventricle has mildly decreased  function. The left ventricle demonstrates global hypokinesis. There is moderate left ventricular hypertrophy. Left ventricular diastolic parameters are  consistent with Grade I diastolic dysfunction (impaired relaxation). Elevated left atrial pressure.  2. Right ventricular systolic function is normal. The right ventricular size is normal.  3. The mitral valve is normal in structure. No evidence of mitral valve regurgitation. No evidence of mitral stenosis.  4. The aortic valve is tricuspid. Aortic valve regurgitation is not visualized. No aortic stenosis is present.  5. The inferior vena cava is normal in size with greater than 50% respiratory variability, suggesting right atrial pressure of 3 mmHg. FINDINGS  Left Ventricle: Left ventricular ejection fraction, by estimation, is 40%. The left ventricle has mildly decreased function. The left ventricle demonstrates global hypokinesis. Definity contrast agent was given IV to delineate the left ventricular endocardial borders. The left ventricular internal cavity size was normal in size. There is moderate left ventricular hypertrophy. Left ventricular diastolic parameters are consistent with Grade I diastolic dysfunction (impaired relaxation). Elevated left atrial pressure. Right Ventricle: The right ventricular size is normal. Right vetricular wall thickness was not well visualized. Right ventricular systolic function is normal. Left Atrium: Left atrial size was normal in size. Right Atrium: Right atrial size was normal in size. Pericardium: There is no evidence of pericardial effusion. Mitral Valve: The mitral valve is normal in structure. No evidence of mitral valve regurgitation. No evidence of mitral valve stenosis. Tricuspid Valve: The tricuspid valve is normal in structure. Tricuspid valve regurgitation is not demonstrated. No evidence of tricuspid stenosis. Aortic Valve: The aortic valve is tricuspid. Aortic valve regurgitation is not visualized. No aortic  stenosis is present. Aortic valve mean gradient measures 2.0 mmHg. Aortic valve peak gradient measures 3.7 mmHg. Aortic valve area, by VTI measures 2.11 cm. Pulmonic Valve: The pulmonic valve was not well visualized. Pulmonic valve regurgitation is not visualized. No evidence of pulmonic stenosis. Aorta: The aortic root is normal in size and structure. Venous: The inferior vena cava is normal in size with greater than 50% respiratory variability, suggesting right atrial pressure of 3 mmHg. IAS/Shunts: No atrial level shunt detected by color flow Doppler.  LEFT VENTRICLE PLAX 2D LVIDd:         5.50 cm   Diastology LVIDs:         4.40 cm   LV e' medial:    2.83 cm/s LV PW:         1.30 cm   LV  E/e' medial:  24.4 LV IVS:        1.30 cm   LV e' lateral:   5.33 cm/s LVOT diam:     2.10 cm   LV E/e' lateral: 12.9 LV SV:         55 LV SV Index:   26 LVOT Area:     3.46 cm  RIGHT VENTRICLE RV S prime:     10.70 cm/s TAPSE (M-mode): 2.1 cm LEFT ATRIUM           Index        RIGHT ATRIUM           Index LA diam:      3.80 cm 1.79 cm/m   RA Area:     14.20 cm LA Vol (A2C): 67.0 ml 31.62 ml/m  RA Volume:   35.00 ml  16.52 ml/m LA Vol (A4C): 67.8 ml 32.00 ml/m  AORTIC VALVE AV Area (Vmax):    2.10 cm AV Area (Vmean):   1.83 cm AV Area (VTI):     2.11 cm AV Vmax:           96.78 cm/s AV Vmean:          67.540 cm/s AV VTI:            0.261 m AV Peak Grad:      3.7 mmHg AV Mean Grad:      2.0 mmHg LVOT Vmax:         58.70 cm/s LVOT Vmean:        35.700 cm/s LVOT VTI:          0.159 m LVOT/AV VTI ratio: 0.61  AORTA Ao Root diam: 3.70 cm MITRAL VALVE MV Area (PHT): 4.89 cm     SHUNTS MV Decel Time: 155 msec     Systemic VTI:  0.16 m MV E velocity: 69.00 cm/s   Systemic Diam: 2.10 cm MV A velocity: 103.00 cm/s MV E/A ratio:  0.67 Dina Rich MD Electronically signed by Dina Rich MD Signature Date/Time: 04/11/2023/12:56:21 PM    Final    DG Chest Portable 1 View Result Date: 04/10/2023 CLINICAL DATA:  Short of  breath, COPD EXAM: PORTABLE CHEST 1 VIEW COMPARISON:  03/06/2023 FINDINGS: Single frontal view of the chest demonstrates stable right internal jugular dialysis catheter and single lead defibrillator. Cardiac silhouette is stable. No acute airspace disease, effusion, or pneumothorax. No acute bony abnormalities. IMPRESSION: 1. Stable chest, no acute process. Electronically Signed   By: Sharlet Salina M.D.   On: 04/10/2023 21:52      Subjective: Seen this morning prior to dialysis.  Denied complaints.  Anxious to complete dialysis and DC home.  No chest pain or pain elsewhere.  No dyspnea.  Discharge Exam:  Vitals:   04/14/23 2057 04/15/23 0004 04/15/23 0412 04/15/23 1311  BP: (!) 142/52 (!) 132/53 (!) 136/52   Pulse: 74 78 77   Resp:  18 18   Temp:  99 F (37.2 C) 98.1 F (36.7 C)   TempSrc:  Oral Oral   SpO2: 100% 100% 99%   Weight:    86.7 kg  Height:        General exam: Middle-age male, moderately built and nourished lying comfortably propped up in bed without distress. Respiratory system: Clear to auscultation.  No increased work of breathing. Cardiovascular system: S1 & S2 heard, RRR. No JVD, murmurs, rubs, gallops or clicks. No pedal edema.  Telemetry personally reviewed: Sinus rhythm with BBB morphology.  Gastrointestinal system: Abdomen is nondistended, soft and nontender. No organomegaly or masses felt. Normal bowel sounds heard. Central nervous system: Alert and oriented. No focal neurological deficits. Extremities: Symmetric 5 x 5 power.  Bilateral BKA, had both his prosthesis on. Skin: No rashes, lesions or ulcers.   Psychiatry: Judgement and insight appear normal. Mood & affect appropriate.     The results of significant diagnostics from this hospitalization (including imaging, microbiology, ancillary and laboratory) are listed below for reference.     Microbiology: No results found for this or any previous visit (from the past 240 hours).   Labs: CBC: Recent  Labs  Lab 04/10/23 2210 04/11/23 0532 04/13/23 0519 04/13/23 1231 04/14/23 0927 04/15/23 0450 04/15/23 1141  WBC 11.5*   < > 12.3* 11.5* 13.5* 10.6* 11.8*  NEUTROABS 8.6*  --   --   --   --   --   --   HGB 12.2*   < > 10.4* 10.7* 10.9* 11.0* 10.6*  HCT 36.5*   < > 31.0* 31.8* 33.4* 33.0* 32.1*  MCV 88.6   < > 89.1 89.3 89.1 89.2 89.9  PLT 218   < > 188 185 204 201 194   < > = values in this interval not displayed.    Basic Metabolic Panel: Recent Labs  Lab 04/13/23 0519 04/13/23 1231 04/14/23 0927 04/15/23 0450 04/15/23 1141  NA 132* 129* 133* 134* 133*  K 4.1 3.9 3.8 4.2 4.6  CL 94* 93* 95* 98 98  CO2 21* 19* 23 20* 20*  GLUCOSE 109* 202* 116* 108* 197*  BUN 63* 63* 35* 48* 55*  CREATININE 10.29* 10.90* 7.67* 9.64* 10.27*  CALCIUM 9.0 9.0 8.8* 9.0 8.9  PHOS  --  7.4* 4.9* 7.3* 7.0*    Liver Function Tests: Recent Labs  Lab 04/10/23 2210 04/13/23 1231 04/14/23 0927 04/15/23 0450 04/15/23 1141  AST 37  --   --   --   --   ALT 25  --   --   --   --   ALKPHOS 114  --   --   --   --   BILITOT 0.8  --   --   --   --   PROT 7.5  --   --   --   --   ALBUMIN 3.5 2.6* 2.9* 2.8* 2.8*    CBG: Recent Labs  Lab 04/14/23 2148 04/15/23 0002 04/15/23 0407 04/15/23 0739 04/15/23 1156  GLUCAP 87 135* 108* 170* 240*      Time coordinating discharge: 40 minutes  SIGNED:  Marcellus Scott, MD,  FACP, [facility], [facility], [facility]   [facility] [place]     To contact the attending provider between 7A-7P or the covering provider during after hours 7P-7A, please log into the web site [email or web address] and access using universal [place] password for that web site. If you do not have the password, please call the hospital operator.

## 2023-04-15 NOTE — Discharge Instructions (Signed)

## 2023-04-17 ENCOUNTER — Telehealth: Payer: Self-pay

## 2023-04-17 ENCOUNTER — Encounter (HOSPITAL_COMMUNITY): Payer: Self-pay | Admitting: Cardiology

## 2023-04-17 ENCOUNTER — Telehealth (HOSPITAL_COMMUNITY): Payer: Self-pay

## 2023-04-17 MED FILL — Nitroglycerin IV Soln 100 MCG/ML in D5W: INTRA_ARTERIAL | Qty: 10 | Status: AC

## 2023-04-17 MED FILL — Heparin Sodium (Porcine) Inj 1000 Unit/ML: INTRAMUSCULAR | Qty: 10 | Status: AC

## 2023-04-17 NOTE — Transitions of Care (Post Inpatient/ED Visit) (Signed)
 04/17/2023  Name: Anthony Bullock MRN: [id number] DOB: 1960-08-31  Today's TOC FU Call Status: Today's TOC FU Call Status:: Successful TOC FU Call Completed TOC FU Call Complete Date: 04/17/23 Patient's Name and Date of Birth confirmed.  Transition Care Management Follow-up Telephone Call Date of Discharge: 04/15/23 Discharge Facility: [facility]) Type of Discharge: Inpatient Admission Primary Inpatient Discharge Diagnosis:: NSTEMI How have you been since you were released from the hospital?: Better (states weak) Any questions or concerns?: No  Items Reviewed: Did you receive and understand the discharge instructions provided?: Yes Medications obtained,verified, and reconciled?: Yes (Medications Reviewed) Any new allergies since your discharge?: No Dietary orders reviewed?: Yes Type of Diet Ordered:: low salt, heart healthy, carb modified Do you have support at home?: Yes People in Home [RPT]: spouse Name of Support/Comfort Primary Source: Darl Pikes  Medications Reviewed Today: Medications Reviewed Today     Reviewed by Earlie Server, RN (Registered Nurse) on 04/17/23 at 1057  Med List Status: <None>   Medication Order Taking? Sig Documenting Provider Last Dose Status Informant  acetaminophen (TYLENOL) 325 MG tablet [id number] Yes Take 1-2 tablets (325-650 mg total) by mouth every 4 (four) hours as needed for mild pain. Jacquelynn Cree, PA-C Taking Active Spouse/Significant Other, Pharmacy Records  apixaban (ELIQUIS) 5 MG TABS tablet [id number] Yes Take 1 tablet (5 mg total) by mouth 2 (two) times daily. Kristian Covey, MD Taking Active Spouse/Significant Other, Pharmacy Records  aspirin EC 81 MG tablet [id number] Yes Take 1 tablet (81 mg total) by mouth daily. Swallow whole. Elease Etienne, MD Taking Active   atorvastatin (LIPITOR) 80 MG tablet [id number] Yes TAKE 1 TABLET BY MOUTH AT BEDTIME  Patient taking differently: Take 80 mg by mouth daily.   Kristian Covey, MD  Taking Active Spouse/Significant Other, Pharmacy Records  carvedilol (COREG) 12.5 MG tablet [id number] Yes Take 1 tablet (12.5 mg total) by mouth 2 (two) times daily. Kristian Covey, MD Taking Active Spouse/Significant Other, Pharmacy Records  cinacalcet [facility]) 30 MG tablet [id number] Yes Take 1 tablet (30 mg total) by mouth Every Tuesday,Thursday,and Saturday with dialysis. Jacquelynn Cree, PA-C Taking Active Spouse/Significant Other, Pharmacy Records  clopidogrel (PLAVIX) 75 MG tablet [id number] Yes Take 1 tablet (75 mg total) by mouth daily with breakfast. Elease Etienne, MD Taking Active   Continuous Glucose Receiver (FREESTYLE LIBRE 3 READER) DEVI [id number] Yes Use to check blood glucose TID Kristian Covey, MD Taking Active Spouse/Significant Other, Pharmacy Records  Continuous Glucose Sensor (FREESTYLE LIBRE 3 PLUS SENSOR) [place] [id number] Yes Change sensor every 15 days. Use as directed to check glucose daily Kristian Covey, MD Taking Active Spouse/Significant Other, Pharmacy Records  gabapentin (NEURONTIN) 300 MG capsule [id number] Yes Take 1 capsule (300 mg total) by mouth at bedtime. Elease Etienne, MD Taking Active   glucose blood test strip [id number] Yes Check 1 time daily. E11.9 One Touch Ultra Blue Test Strips Kristian Covey, MD Taking Active Spouse/Significant Other, Pharmacy Records  HUMALOG KWIKPEN 200 UNIT/ML KwikPen [id number] Yes INJECT A MAXIMUM OF 28 UNITS SUBCUTANEOUSLY TWICE DAILY WITH LUNCH AND SUPPER PER SLIDING SCALE. APPOINTMENT REQUIRED FOR FUTURE REFILLS Burchette, Elberta Fortis, MD Taking Active Spouse/Significant Other, Pharmacy Records  Insulin Pen Needle (BD PEN NEEDLE NANO U/F) 32G X 4 MM MISC [id number] Yes USE 1 PEN NEEDLE SUBCUTANEOUSLY WITH INSULIN 4 TIMES DAILY Burchette, Elberta Fortis, MD Taking Active Spouse/Significant Other, Pharmacy Records  LANTUS SOLOSTAR 100 UNIT/ML Solostar Pen [id number] Yes INJECT  12 UNITS SUBCUTANEOUSLY AT BEDTIME  Patient taking  differently: Inject 14 Units into the skin at bedtime.   Kristian Covey, MD Taking Active Spouse/Significant Other, Pharmacy Records  midodrine (PROAMATINE) 10 MG tablet [id number] Yes Take 10 mg by mouth 3 (three) times a week. EVERY TUESDAY, THURSDAY, AND SATURDAY [provider] Taking Active Spouse/Significant Other, Pharmacy Records           Med Note Haywood Lasso   Sat Sep 24, 2022  1:55 PM)    multivitamin (RENA-VIT) TABS tablet [id number] Yes Take 1 tablet by mouth once daily  Patient taking differently: Take 1 tablet by mouth daily.   Kristian Covey, MD Taking Active Spouse/Significant Other, Pharmacy Records  Olopatadine HCl 0.2 % SOLN [id number] Yes Place 1 drop into both eyes daily as needed (for allergies).  [provider] Taking Active Spouse/Significant Other, Pharmacy Records  pantoprazole (PROTONIX) 20 MG tablet [id number] No Take 20 mg by mouth daily.  Patient not taking: Reported on 04/17/2023   [provider] Not Taking Active Spouse/Significant Other, Pharmacy Records  sevelamer (RENAGEL) 800 MG tablet [id number] Yes Take 1,600 mg by mouth 3 (three) times daily with meals. [provider] Taking Active Spouse/Significant Other, Pharmacy Records  VENTOLIN HFA 108 [phone] Base) MCG/ACT inhaler [id number] Yes Inhale 1-2 puffs into the lungs every 6 (six) hours as needed for wheezing or shortness of breath. Kristian Covey, MD Taking Active Spouse/Significant Other, Pharmacy Records  Med List Note Carver Fila 07/09/15 1108): Pt has dialysis on Tuesdays, Thursdays, saturdays            Home Care and Equipment/Supplies: Were Home Health Services Ordered?: No Any new equipment or medical supplies ordered?: No  Functional Questionnaire: Do you need assistance with bathing/showering or dressing?: Yes (wife to assist) Do you need assistance with meal preparation?: Yes (wife) Do you need assistance with eating?: No Do you have  difficulty maintaining continence: No (no urine) Do you need assistance with getting out of bed/getting out of a chair/moving?: No Do you have difficulty managing or taking your medications?: No  Follow up appointments reviewed: PCP Follow-up appointment confirmed?: No (will call today to make an appointment.) MD Provider Line Number:[phone] Given: No Specialist Hospital Follow-up appointment confirmed?: Yes Date of Specialist follow-up appointment?: 05/03/23 Follow-Up Specialty Provider:: Pulmonary Do you need transportation to your follow-up appointment?: No Do you understand care options if your condition(s) worsen?: Yes-patient verbalized understanding  SDOH Interventions Today    Flowsheet Row Most Recent Value  SDOH Interventions   Food Insecurity Interventions Intervention Not Indicated  Housing Interventions Intervention Not Indicated  Transportation Interventions Intervention Not Indicated  Utilities Interventions Intervention Not Indicated       Complete review of systems done. Please see assessment tab.   Patient reports that he is doing well. Reports no needs today.  Patient is self managing his Dm, ESRD, and cardiac conditions well. Very knowledgeable about self management and when to call MD.  Patient reports that he will call cardiology and PCP today for follow up appointments. Reviewed and offered 30 day TOC program and patient declined. Provided my contact information for patient to call me if needed.  Lonia Chimera, RN, BSN, CEN [employer]- Transition of Care Team.  [facility] [phone]

## 2023-04-17 NOTE — Telephone Encounter (Signed)
 Called patient regarding referral to cardiac rehab with NSTEMI/Stent placement. He says he will not be able to participate in the program at this time due to his dialysis schedule and transportation. Informed him to contact us if he changes his mind. He verbalized understanding.

## 2023-04-17 NOTE — Progress Notes (Signed)
 Late Note Entry- April 17, 2023  Pt was d/c on Saturday. Contacted [facility] this morning to be advised of pt's d/c date and that pt should resume care tomorrow. D/C summary and last renal note faxed to clinic for continuation of care.   Olivia Canter Renal Navigator [phone]

## 2023-04-18 DIAGNOSIS — D509 Iron deficiency anemia, unspecified: Secondary | ICD-10-CM | POA: Diagnosis not present

## 2023-04-18 DIAGNOSIS — N186 End stage renal disease: Secondary | ICD-10-CM | POA: Diagnosis not present

## 2023-04-18 DIAGNOSIS — N2581 Secondary hyperparathyroidism of renal origin: Secondary | ICD-10-CM | POA: Diagnosis not present

## 2023-04-18 DIAGNOSIS — Z992 Dependence on renal dialysis: Secondary | ICD-10-CM | POA: Diagnosis not present

## 2023-04-18 DIAGNOSIS — N250 Renal osteodystrophy: Secondary | ICD-10-CM | POA: Diagnosis not present

## 2023-04-18 DIAGNOSIS — D631 Anemia in chronic kidney disease: Secondary | ICD-10-CM | POA: Diagnosis not present

## 2023-04-20 DIAGNOSIS — N250 Renal osteodystrophy: Secondary | ICD-10-CM | POA: Diagnosis not present

## 2023-04-20 DIAGNOSIS — N186 End stage renal disease: Secondary | ICD-10-CM | POA: Diagnosis not present

## 2023-04-20 DIAGNOSIS — D509 Iron deficiency anemia, unspecified: Secondary | ICD-10-CM | POA: Diagnosis not present

## 2023-04-20 DIAGNOSIS — N2581 Secondary hyperparathyroidism of renal origin: Secondary | ICD-10-CM | POA: Diagnosis not present

## 2023-04-20 DIAGNOSIS — D631 Anemia in chronic kidney disease: Secondary | ICD-10-CM | POA: Diagnosis not present

## 2023-04-20 DIAGNOSIS — Z992 Dependence on renal dialysis: Secondary | ICD-10-CM | POA: Diagnosis not present

## 2023-04-21 ENCOUNTER — Encounter: Payer: Self-pay | Admitting: Family Medicine

## 2023-04-21 ENCOUNTER — Ambulatory Visit: Admitting: Family Medicine

## 2023-04-21 VITALS — BP 142/60 | HR 76 | Temp 97.5°F | Wt 196.5 lb

## 2023-04-21 DIAGNOSIS — Z992 Dependence on renal dialysis: Secondary | ICD-10-CM | POA: Diagnosis not present

## 2023-04-21 DIAGNOSIS — N186 End stage renal disease: Secondary | ICD-10-CM

## 2023-04-21 DIAGNOSIS — E1142 Type 2 diabetes mellitus with diabetic polyneuropathy: Secondary | ICD-10-CM | POA: Diagnosis not present

## 2023-04-21 DIAGNOSIS — E785 Hyperlipidemia, unspecified: Secondary | ICD-10-CM

## 2023-04-21 DIAGNOSIS — I214 Non-ST elevation (NSTEMI) myocardial infarction: Secondary | ICD-10-CM | POA: Diagnosis not present

## 2023-04-21 MED ORDER — NITROGLYCERIN 0.4 MG SL SUBL: 0.4000 mg | SUBLINGUAL_TABLET | SUBLINGUAL | 1 refills | Status: DC | PRN

## 2023-04-21 NOTE — Progress Notes (Signed)
 Established Patient Office Visit  Subjective   Patient ID: Anthony Bullock, male    DOB: [birth year]  Age: 63 y.o. MRN: [id number]  Chief Complaint  Patient presents with   Hospitalization Follow-up    HPI   Anthony Bullock is seen for hospital follow-up following recent non-ST elevation MI.  He has history of heart failure with reduced ejection fraction, atrial fibrillation, CAD, hypertension, peripheral vascular disease, end-stage renal disease on hemodialysis, COPD, history of bilateral below-knee amputations.  He had been having some intermittent dyspnea and was thought initially to have some volume overload.  Discharge note states that he missed hemodialysis last Saturday prior to ED admission but patient denies this.  He presented to the ED with shortness of breath and some atypical burning substernally.  Elevated troponins which eventually peaked at 16,000.  He was started on heparin drip and transferred to [facility] for further evaluation.  Nephrology was consulted for his dialysis needs.  He underwent PCI staged on 4-2 and 4-4.  Cath revealed severe three-vessel obstructive CAD.  He had stenting of the LAD and first diagonal on the second and RCA on the fourth.  After couple days noted the dyspnea had resolved.  Feels much better now overall.  Was discharged home on aspirin, Plavix, plus continued Eliquis.  No further CP.  Echocardiogram revealed EF 40%.  Chronic anemia with hemoglobin stable 10 range.  Chronic mild hyponatremia low 130s.  LDL cholesterol 7 with total cholesterol 78.  LP(a) 13.8.  Denies any chest pain since discharge.  He is requesting prescription for nitroglycerin to have on hand.  He has been very diligent with diet in recent months and recent A1c 6.6%.  Has especially been careful regarding his sugar and starch intake.  Does not care for a lot of protein sources and recent albumin 2.8.  Past Medical History:  Diagnosis Date   AICD (automatic cardioverter/defibrillator)  present    [employer]   Allergic rhinitis    Anemia    Arthritis    Chronic systolic heart failure (HCC)    a. ECHO (12/2012) EF 25-30%, HK entireanteroseptal myocardium //  b.  EF 25%, diffuse HK, grade 1 diastolic dysfunction, MAC, mild LAE, normal RVSF, trivial pericardial effusion   COPD (chronic obstructive pulmonary disease) (HCC)    Diabetes mellitus type II    Diabetic nephropathy (HCC)    Diabetic neuropathy (HCC)    ESRD on hemodialysis (HCC)    started HD June 2017, goes to [facility] HD unit, Dr Fausto Skillern   History of cardiac catheterization    a.Myoview 1/15:  There is significant left ventricular dysfunction. There may be slight scar at the apex. There is no significant ischemia. LV Ejection Fraction: 27%  //  b. RHC/LHC (1/15) with mean RA 6, PA 47/22 mean 33, mean PCWP 20, PVR 2.5 WU, CI 2.5; 80% dLAD stenosis, 70% diffuse large D.     History of kidney stones    Hyperlipidemia    Hypertension    Kidney stones    NICM (nonischemic cardiomyopathy) (HCC)    Primarily nonischemic.  Echo (12/14) with EF 25-30%.  Echo (3/15) with EF 25%, mild to moderately dilated LV, normal RV size and systolic function.     Osteomyelitis (HCC)    left fifth ray   Pneumonia    Urethral stricture    Wears glasses    Past Surgical History:  Procedure Laterality Date   ABDOMINAL AORTOGRAM W/LOWER EXTREMITY N/A 03/30/2016   Procedure:  Abdominal Aortogram w/Lower Extremity;  Surgeon: Chuck Hint, MD;  Location: [facility] INVASIVE CV LAB;  Service: Cardiovascular;  Laterality: N/A;   AMPUTATION Right 04/26/2016   Procedure: Right Below Knee Amputation;  Surgeon: Nadara Mustard, MD;  Location: [facility] OR;  Service: Orthopedics;  Laterality: Right;   AMPUTATION Left 08/21/2019   Procedure: LEFT FOOT 5TH RAY AMPUTATION;  Surgeon: Nadara Mustard, MD;  Location: [facility] OR;  Service: Orthopedics;  Laterality: Left;   AMPUTATION Left 11/13/2019   Procedure: LEFT BELOW KNEE AMPUTATION;  Surgeon: Nadara Mustard, MD;  Location: [facility] OR;  Service: Orthopedics;  Laterality: Left;   AV FISTULA PLACEMENT Right 09/08/2015   Procedure: INSERTION OF 4-82mm x 45cm  ARTERIOVENOUS (AV) GORE-TEX GRAFT RIGHT UPPER  ARM;  Surgeon: Chuck Hint, MD;  Location: [facility] OR;  Service: Vascular;  Laterality: Right;   AV FISTULA PLACEMENT Left 01/14/2016   Procedure: CREATION OF LEFT UPPER ARM ARTERIOVENOUS FISTULA;  Surgeon: Chuck Hint, MD;  Location: [facility] OR;  Service: Vascular;  Laterality: Left;   BASCILIC VEIN TRANSPOSITION Right 08/22/2014   Procedure: RIGHT UPPER ARM BASCILIC VEIN TRANSPOSITION;  Surgeon: Chuck Hint, MD;  Location: [facility] OR;  Service: Vascular;  Laterality: Right;   BELOW KNEE LEG AMPUTATION Right 04/26/2016   CARDIAC CATHETERIZATION     CARDIAC DEFIBRILLATOR PLACEMENT  06/27/2013   Sub Q       BY DR Graciela Husbands   CATARACT EXTRACTION W/PHACO Right 08/06/2018   Procedure: CATARACT EXTRACTION PHACO AND INTRAOCULAR LENS PLACEMENT (IOC);  Surgeon: Fabio Pierce, MD;  Location: [facility] ORS;  Service: Ophthalmology;  Laterality: Right;  CDE: 4.06   CATARACT EXTRACTION W/PHACO Left 08/20/2018   Procedure: CATARACT EXTRACTION PHACO AND INTRAOCULAR LENS PLACEMENT (IOC);  Surgeon: Fabio Pierce, MD;  Location: [facility] ORS;  Service: Ophthalmology;  Laterality: Left;  CDE: 6.76   COLONOSCOPY WITH PROPOFOL N/A 07/22/2015   Procedure: COLONOSCOPY WITH PROPOFOL;  Surgeon: Sherrilyn Rist, MD;  Location: [facility] ENDOSCOPY;  Service: Gastroenterology;  Laterality: N/A;   CORONARY LITHOTRIPSY N/A 04/14/2023   Procedure: CORONARY LITHOTRIPSY;  Surgeon: Marykay Lex, MD;  Location: [facility] INVASIVE CV LAB;  Service: Cardiovascular;  Laterality: N/A;  RCA   CORONARY STENT INTERVENTION N/A 04/12/2023   Procedure: CORONARY STENT INTERVENTION;  Surgeon: Swaziland, Peter M, MD;  Location: [facility] INVASIVE CV LAB;  Service: Cardiovascular;  Laterality: N/A;   CORONARY STENT INTERVENTION N/A 04/14/2023   Procedure: CORONARY STENT  INTERVENTION;  Surgeon: Marykay Lex, MD;  Location: [facility] INVASIVE CV LAB;  Service: Cardiovascular;  Laterality: N/A;   FEMORAL-POPLITEAL BYPASS GRAFT Right 03/31/2016   Procedure: BYPASS GRAFT FEMORAL-POPLITEAL ARTERY USING RIGHT GREATER SAPHENOUS NONREVERSED VEIN;  Surgeon: Chuck Hint, MD;  Location: [facility] OR;  Service: Vascular;  Laterality: Right;   HERNIA REPAIR     I & D EXTREMITY Right 03/31/2016   Procedure: IRRIGATION AND DEBRIDEMENT FOOT;  Surgeon: Chuck Hint, MD;  Location: [facility] OR;  Service: Vascular;  Laterality: Right;   IMPLANTABLE CARDIOVERTER DEFIBRILLATOR IMPLANT N/A 06/27/2013   Procedure: SUB Q ICD;  Surgeon: Duke Salvia, MD;  Location: [facility] CATH LAB;  Service: Cardiovascular;  Laterality: N/A;   INTRAOPERATIVE ARTERIOGRAM Right 03/31/2016   Procedure: INTRA OPERATIVE ARTERIOGRAM;  Surgeon: Chuck Hint, MD;  Location: [facility] OR;  Service: Vascular;  Laterality: Right;   IR GENERIC HISTORICAL Right 11/30/2015   IR THROMBECTOMY AV FISTULA W/THROMBOLYSIS/PTA INC/SHUNT/IMG RIGHT 11/30/2015 Irish Lack, MD [facility] RAD   IR  GENERIC HISTORICAL  11/30/2015   IR US GUIDE VASC ACCESS RIGHT 11/30/2015 Irish Lack, MD [facility]   IR GENERIC HISTORICAL Right 12/15/2015   IR THROMBECTOMY AV FISTULA W/THROMBOLYSIS/PTA/STENT INC/SHUNT/IMG RT 12/15/2015 Oley Balm, MD [facility]   IR GENERIC HISTORICAL  12/15/2015   IR US GUIDE VASC ACCESS RIGHT 12/15/2015 Oley Balm, MD [facility]   IR GENERIC HISTORICAL  12/28/2015   IR FLUORO GUIDE CV LINE RIGHT 12/28/2015 Jolaine Click, MD [facility]   IR GENERIC HISTORICAL  12/28/2015   IR US GUIDE VASC ACCESS RIGHT 12/28/2015 Jolaine Click, MD [facility]   LEFT A ND RIGHT HEART CATH  01/30/2013   DR Jones Broom   LEFT AND RIGHT HEART CATHETERIZATION WITH CORONARY ANGIOGRAM N/A 01/30/2013   Procedure: LEFT AND RIGHT HEART CATHETERIZATION WITH CORONARY ANGIOGRAM;  Surgeon: Dolores Patty, MD;  Location:  [facility] CATH LAB;  Service: Cardiovascular;  Laterality: N/A;   LEFT HEART CATH AND CORONARY ANGIOGRAPHY N/A 04/12/2023   Procedure: LEFT HEART CATH AND CORONARY ANGIOGRAPHY;  Surgeon: Swaziland, Peter M, MD;  Location: [facility] INVASIVE CV LAB;  Service: Cardiovascular;  Laterality: N/A;   PERIPHERAL VASCULAR CATHETERIZATION Right 01/26/2015   Procedure: A/V Fistulagram;  Surgeon: Chuck Hint, MD;  Location: [facility] INVASIVE CV LAB;  Service: Cardiovascular;  Laterality: Right;   reapea urethral surgery for recurrent obstruction  2011   TOTAL KNEE ARTHROPLASTY Right 2007   VEIN HARVEST Right 03/31/2016   Procedure: RIGHT GREATER SAPHENOUS VEIN HARVEST;  Surgeon: Chuck Hint, MD;  Location: [facility] OR;  Service: Vascular;  Laterality: Right;    reports that he quit smoking about 12 years ago. His smoking use included cigarettes. He started smoking about 44 years ago. He has a 64 pack-year smoking history. He has never used smokeless tobacco. He reports that he does not drink alcohol and does not use drugs. family history includes Alcohol abuse in his father; Bladder Cancer in his mother; Diabetes in his maternal grandmother; Heart Problems in his maternal grandmother; Heart disease in his maternal grandfather; Melanoma in his father; Prostate cancer in his maternal grandfather; Stroke in his maternal grandmother. Allergies  Allergen Reactions   Epoetin Alfa Other (See Comments)    Unknown    Ferumoxytol Other (See Comments)    Unknown    Morphine Sulfate Rash and Other (See Comments)    Itches all over, red spots    Review of Systems  Constitutional:  Negative for chills, fever and malaise/fatigue.  Eyes:  Negative for blurred vision.  Respiratory:  Negative for shortness of breath.   Cardiovascular:  Negative for chest pain.  Gastrointestinal:  Negative for abdominal pain.  Neurological:  Negative for dizziness, weakness and headaches.      Objective:     BP (!) 142/60 (BP Location: Left  Arm, Patient Position: Sitting, Cuff Size: Normal)   Pulse 76   Temp (!) 97.5 F (36.4 C) (Oral)   Wt 196 lb 8 oz (89.1 kg)   SpO2 97%   BMI 28.19 kg/m  BP Readings from Last 3 Encounters:  04/21/23 (!) 142/60  04/15/23 139/60  03/20/23 106/60   Wt Readings from Last 3 Encounters:  04/21/23 196 lb 8 oz (89.1 kg)  04/17/23 185 lb (83.9 kg)  04/15/23 187 lb 2.7 oz (84.9 kg)      Physical Exam Vitals reviewed.  Constitutional:      Appearance: He is well-developed.  Eyes:     Pupils: Pupils are equal, round, and reactive  to light.  Neck:     Thyroid: No thyromegaly.  Cardiovascular:     Rate and Rhythm: Normal rate and regular rhythm.  Pulmonary:     Effort: Pulmonary effort is normal. No respiratory distress.     Breath sounds: Normal breath sounds. No wheezing or rales.  Musculoskeletal:     Cervical back: Neck supple.  Neurological:     Mental Status: He is alert and oriented to person, place, and time.      No results found for any visits on 04/21/23.  Last CBC Lab Results  Component Value Date   WBC 11.8 (H) 04/15/2023   HGB 10.6 (L) 04/15/2023   HCT 32.1 (L) 04/15/2023   MCV 89.9 04/15/2023   MCH 29.7 04/15/2023   RDW 14.6 04/15/2023   PLT 194 04/15/2023   Last metabolic panel Lab Results  Component Value Date   GLUCOSE 197 (H) 04/15/2023   NA 133 (L) 04/15/2023   K 4.6 04/15/2023   CL 98 04/15/2023   CO2 20 (L) 04/15/2023   BUN 55 (H) 04/15/2023   CREATININE 10.27 (H) 04/15/2023   GFRNONAA 5 (L) 04/15/2023   CALCIUM 8.9 04/15/2023   PHOS 7.0 (H) 04/15/2023   PROT 7.5 04/10/2023   ALBUMIN 2.8 (L) 04/15/2023   BILITOT 0.8 04/10/2023   ALKPHOS 114 04/10/2023   AST 37 04/10/2023   ALT 25 04/10/2023   ANIONGAP 15 04/15/2023   Last lipids Lab Results  Component Value Date   CHOL 78 04/11/2023   HDL 30 (L) 04/11/2023   LDLCALC 7 04/11/2023   LDLDIRECT 134.0 04/25/2011   TRIG 203 (H) 04/11/2023   CHOLHDL 2.6 04/11/2023   Last hemoglobin  A1c Lab Results  Component Value Date   HGBA1C 6.6 (A) 03/20/2023   Last thyroid functions Lab Results  Component Value Date   TSH 1.475 08/25/2022      The ASCVD Risk score (Arnett DK, et al., 2019) failed to calculate for the following reasons:   Risk score cannot be calculated because patient has a medical history suggesting prior/existing ASCVD    Assessment & Plan:   #1 recent NSTEMI.  Patient had staged stenting on 4-2 of LAD and first diagonal followed by RCA on 4-4.  Poor candidate for bypass.  Dyspnea has improved with no further chest pain since hospitalization.  Patient now on aspirin plus Plavix in addition to Eliquis for his chronic A-fib. Continue close follow-up with cardiology.  #2 hyperlipidemia with recent total cholesterol 78 and LDL 7.  LP(a) 13.8  on high dose statin with Lipitor 80 mg daily.    #3 type 2 diabetes controlled with recent A1c 6.6%.  Continue current insulin regimen.  No recent hypoglycemia.   #4 ESRD on hemodialysis.  No volume overload at this time.    Evelena Peat, MD

## 2023-04-22 DIAGNOSIS — Z992 Dependence on renal dialysis: Secondary | ICD-10-CM | POA: Diagnosis not present

## 2023-04-22 DIAGNOSIS — D631 Anemia in chronic kidney disease: Secondary | ICD-10-CM | POA: Diagnosis not present

## 2023-04-22 DIAGNOSIS — N186 End stage renal disease: Secondary | ICD-10-CM | POA: Diagnosis not present

## 2023-04-22 DIAGNOSIS — N250 Renal osteodystrophy: Secondary | ICD-10-CM | POA: Diagnosis not present

## 2023-04-22 DIAGNOSIS — D509 Iron deficiency anemia, unspecified: Secondary | ICD-10-CM | POA: Diagnosis not present

## 2023-04-22 DIAGNOSIS — N2581 Secondary hyperparathyroidism of renal origin: Secondary | ICD-10-CM | POA: Diagnosis not present

## 2023-04-24 ENCOUNTER — Ambulatory Visit (INDEPENDENT_AMBULATORY_CARE_PROVIDER_SITE_OTHER): Admitting: Neurology

## 2023-04-24 ENCOUNTER — Encounter: Payer: Self-pay | Admitting: Neurology

## 2023-04-24 VITALS — HR 78 | Ht 70.0 in | Wt 197.0 lb

## 2023-04-24 DIAGNOSIS — I739 Peripheral vascular disease, unspecified: Secondary | ICD-10-CM

## 2023-04-24 DIAGNOSIS — I214 Non-ST elevation (NSTEMI) myocardial infarction: Secondary | ICD-10-CM

## 2023-04-24 DIAGNOSIS — I6523 Occlusion and stenosis of bilateral carotid arteries: Secondary | ICD-10-CM | POA: Diagnosis not present

## 2023-04-24 DIAGNOSIS — H4901 Third [oculomotor] nerve palsy, right eye: Secondary | ICD-10-CM

## 2023-04-24 DIAGNOSIS — H532 Diplopia: Secondary | ICD-10-CM

## 2023-04-24 DIAGNOSIS — I2510 Atherosclerotic heart disease of native coronary artery without angina pectoris: Secondary | ICD-10-CM | POA: Diagnosis not present

## 2023-04-24 DIAGNOSIS — I480 Paroxysmal atrial fibrillation: Secondary | ICD-10-CM

## 2023-04-24 NOTE — Progress Notes (Signed)
 [facility]  PATIENT: Anthony Bullock DOB: 04/12/1960  REFERRING DOCTOR OR PCP: Evelena Peat MD; Gwyneth Sprout, MD SOURCE: Patient, notes from ED/hospital, multiple imaging and lab reports reviewed, notable CT scan images reviewed.  _________________________________   HISTORICAL  CHIEF COMPLAINT:  Chief Complaint  Patient presents with   Follow-up    Pt in 10 with wife Pt states no questions or concerns for todays visit Pt states had MI last week and 4 stents were placed in heart     HISTORY OF PRESENT ILLNESS:  Anthony Bullock is a 63 y.o. man with a  right 3rd nerve palsy.   Update 04/24/2023 Last seen 08/09/2022.   By the end of 08/2022, eye movements were better and completely back to baseline by 10/2022.   He is a 63 year old man with diabetes mellitus, hypertension and end-stage renal disease on hemodialysis.  Last week he had an MI and 4 stents were placed.  He is doing better.  He is on Eliquis/Plavix and asa x 30 days (then Plavix will be d/c).   Fenofibrate was d/c.      He reports hyperlipidemia is well controlled.   His kidneys are doing well (does HD)   Vascular risks: He has diabetes mellitus, hypertension, end-stage renal disease on hemodialysis. H/o tobacco,  He has peripheral vascular disease and has had bilateral leg amputations  History of 3rd nerve palsy: He presented to the emergency room 08/04/2022 with binocular diplopia and ptosis.  He also had some right eye pain.  The vision was unchanged.     CT scan of the time showed no acute findings.  There is some calcification within the vertebral and internal carotid arteries.  He was referred to his ophthalmologist who diagnosed a right 3rd nerve palsy (likely due to diabetes) and sent him back to the emergency room.  CT angiogram 08/05/2022 showed no large vessel occlusion.  However, there was severe stenosis of the cavernous internal carotid arteries bilaterally and moderate atherosclerosis of  the distal left common carotid artery and bifurcation (not hemodynamically significant).  There is moderate narrowing of the left vertebral artery origin.  There is calcification in the intracerebral vertebral arteries  Compared to a few days ago, the weakness of the EOM are the same.  Pain actually improved when the eye was dilated but worsened again the next day.  .  IMAGING: Note: He is not an MRI candidate due to a noncompatible AICD.  CT head 08/04/2022 shows no acute findings.  There is mild chronic microvascular ischemic change noted.  Calcification is noted within the vertebral arteries and internal carotid arteries.  Calcification of the falx.  CTA Head/neck 08/05/2022 showed: 1. No emergent large vessel occlusion. 2. Severe stenosis of both cavernous internal carotid arteries secondary to calcific atherosclerosis. 3. Moderate mixed density atherosclerosis within the distal left common carotid artery and at the carotid bifurcation without hemodynamically significant stenosis of the internal carotid arteries. 4. Moderate narrowing of the left vertebral artery origin due to calcific atherosclerosis. (5) Also, by my read, there is calcification with some stenosis of both V4 segments of the vertebral arteries.  Carotid Doppler report 07/29/2021 showed 1 to 39% stenosis in both ICAs and less than 50% stenosis in the left CCA and greater than 50% stenosis in the left ECA.  The left vertebral artery had retrograde flow and left subclavian artery flow was disturbed.  Labs: Cholesterol and LDL were in normal limits.  Triglycerides were mildly elevated. Hemoglobin A1c  was 8.7.  REVIEW OF SYSTEMS: Constitutional: No fevers, chills, sweats, or change in appetite Eyes: No visual changes, double vision, eye pain Ear, nose and throat: No hearing loss.  Vision issues as above.  He has cardiovascular: No chest pain, palpitations Respiratory:  No shortness of breath at rest or with exertion.   No  wheezes GastrointestinaI: No nausea, vomiting, diarrhea, abdominal pain, fecal incontinence Genitourinary:  No dysuria, urinary retention or frequency.  No nocturia. Musculoskeletal:  No neck pain, back pain Integumentary/extremity: In the past he had poorly healing diabetic ulcers.  He is status post lateral BKA amputation Neurological: as above Psychiatric: No depression at this time.  No anxiety Endocrine: No palpitations, diaphoresis, change in appetite, change in weigh or increased thirst.  Reports diabetes is well-controlled now but was poorly controlled in the past. Hematologic/Lymphatic:  No anemia, purpura, petechiae. Allergic/Immunologic: No itchy/runny eyes, nasal congestion, recent allergic reactions, rashes  ALLERGIES: Allergies  Allergen Reactions   Epoetin Alfa Other (See Comments)    Unknown    Ferumoxytol Other (See Comments)    Unknown    Morphine Sulfate Rash and Other (See Comments)    Itches all over, red spots    HOME MEDICATIONS:  Current Outpatient Medications:    acetaminophen (TYLENOL) 325 MG tablet, Take 1-2 tablets (325-650 mg total) by mouth every 4 (four) hours as needed for mild pain., Disp: , Rfl:    apixaban (ELIQUIS) 5 MG TABS tablet, Take 1 tablet (5 mg total) by mouth 2 (two) times daily., Disp: 180 tablet, Rfl: 3   aspirin EC 81 MG tablet, Take 1 tablet (81 mg total) by mouth daily. Swallow whole., Disp: 30 tablet, Rfl: 2   atorvastatin (LIPITOR) 80 MG tablet, TAKE 1 TABLET BY MOUTH AT BEDTIME (Patient taking differently: Take 80 mg by mouth daily.), Disp: 90 tablet, Rfl: 3   carvedilol (COREG) 12.5 MG tablet, Take 1 tablet (12.5 mg total) by mouth 2 (two) times daily., Disp: 180 tablet, Rfl: 3   cinacalcet (SENSIPAR) 30 MG tablet, Take 1 tablet (30 mg total) by mouth Every Tuesday,Thursday,and Saturday with dialysis., Disp: 60 tablet, Rfl:    clopidogrel (PLAVIX) 75 MG tablet, Take 1 tablet (75 mg total) by mouth daily with breakfast., Disp: 30  tablet, Rfl: 2   Continuous Glucose Receiver (FREESTYLE LIBRE 3 READER) DEVI, Use to check blood glucose TID, Disp: 1 each, Rfl: 0   Continuous Glucose Sensor (FREESTYLE LIBRE 3 PLUS SENSOR) MISC, Change sensor every 15 days. Use as directed to check glucose daily, Disp: 6 each, Rfl: 3   gabapentin (NEURONTIN) 300 MG capsule, Take 1 capsule (300 mg total) by mouth at bedtime., Disp: 30 capsule, Rfl: 2   glucose blood test strip, Check 1 time daily. E11.9 One Touch Ultra Blue Test Strips, Disp: 100 each, Rfl: 3   HUMALOG KWIKPEN 200 UNIT/ML KwikPen, INJECT A MAXIMUM OF 28 UNITS SUBCUTANEOUSLY TWICE DAILY WITH LUNCH AND SUPPER PER SLIDING SCALE. APPOINTMENT REQUIRED FOR FUTURE REFILLS, Disp: 6 mL, Rfl: 5   Insulin Pen Needle (BD PEN NEEDLE NANO U/F) 32G X 4 MM MISC, USE 1 PEN NEEDLE SUBCUTANEOUSLY WITH INSULIN 4 TIMES DAILY, Disp: 400 each, Rfl: 0   LANTUS SOLOSTAR 100 UNIT/ML Solostar Pen, INJECT 12 UNITS SUBCUTANEOUSLY AT BEDTIME (Patient taking differently: Inject 14 Units into the skin at bedtime.), Disp: 15 mL, Rfl: 0   midodrine (PROAMATINE) 10 MG tablet, Take 10 mg by mouth 3 (three) times a week. EVERY TUESDAY, THURSDAY, AND SATURDAY, Disp: ,  Rfl:    multivitamin (RENA-VIT) TABS tablet, Take 1 tablet by mouth once daily (Patient taking differently: Take 1 tablet by mouth daily.), Disp: 10 tablet, Rfl: 3   nitroGLYCERIN (NITROSTAT) 0.4 MG SL tablet, Place 1 tablet (0.4 mg total) under the tongue every 5 (five) minutes as needed for chest pain., Disp: 30 tablet, Rfl: 1   Olopatadine HCl 0.2 % SOLN, Place 1 drop into both eyes daily as needed (for allergies). , Disp: , Rfl:    sevelamer (RENAGEL) 800 MG tablet, Take 1,600 mg by mouth 3 (three) times daily with meals., Disp: , Rfl:    VENTOLIN HFA 108 (90 Base) MCG/ACT inhaler, Inhale 1-2 puffs into the lungs every 6 (six) hours as needed for wheezing or shortness of breath., Disp: 18 g, Rfl: 1  PAST MEDICAL HISTORY: Past Medical History:   Diagnosis Date   AICD (automatic cardioverter/defibrillator) present    [employer]   Allergic rhinitis    Anemia    Arthritis    Chronic systolic heart failure (HCC)    a. ECHO (12/2012) EF 25-30%, HK entireanteroseptal myocardium //  b.  EF 25%, diffuse HK, grade 1 diastolic dysfunction, MAC, mild LAE, normal RVSF, trivial pericardial effusion   COPD (chronic obstructive pulmonary disease) (HCC)    Diabetes mellitus type II    Diabetic nephropathy (HCC)    Diabetic neuropathy (HCC)    ESRD on hemodialysis (HCC)    started HD June 2017, goes to [facility] HD unit, Dr Fausto Skillern   History of cardiac catheterization    a.Myoview 1/15:  There is significant left ventricular dysfunction. There may be slight scar at the apex. There is no significant ischemia. LV Ejection Fraction: 27%  //  b. RHC/LHC (1/15) with mean RA 6, PA 47/22 mean 33, mean PCWP 20, PVR 2.5 WU, CI 2.5; 80% dLAD stenosis, 70% diffuse large D.     History of kidney stones    Hyperlipidemia    Hypertension    Kidney stones    NICM (nonischemic cardiomyopathy) (HCC)    Primarily nonischemic.  Echo (12/14) with EF 25-30%.  Echo (3/15) with EF 25%, mild to moderately dilated LV, normal RV size and systolic function.     Osteomyelitis (HCC)    left fifth ray   Pneumonia    Urethral stricture    Wears glasses     PAST SURGICAL HISTORY: Past Surgical History:  Procedure Laterality Date   ABDOMINAL AORTOGRAM W/LOWER EXTREMITY N/A 03/30/2016   Procedure: Abdominal Aortogram w/Lower Extremity;  Surgeon: Chuck Hint, MD;  Location: [facility] INVASIVE CV LAB;  Service: Cardiovascular;  Laterality: N/A;   AMPUTATION Right 04/26/2016   Procedure: Right Below Knee Amputation;  Surgeon: Nadara Mustard, MD;  Location: [facility] OR;  Service: Orthopedics;  Laterality: Right;   AMPUTATION Left 08/21/2019   Procedure: LEFT FOOT 5TH RAY AMPUTATION;  Surgeon: Nadara Mustard, MD;  Location: [facility] OR;  Service: Orthopedics;  Laterality:  Left;   AMPUTATION Left 11/13/2019   Procedure: LEFT BELOW KNEE AMPUTATION;  Surgeon: Nadara Mustard, MD;  Location: [facility] OR;  Service: Orthopedics;  Laterality: Left;   AV FISTULA PLACEMENT Right 09/08/2015   Procedure: INSERTION OF 4-33mm x 45cm  ARTERIOVENOUS (AV) GORE-TEX GRAFT RIGHT UPPER  ARM;  Surgeon: Chuck Hint, MD;  Location: [facility] OR;  Service: Vascular;  Laterality: Right;   AV FISTULA PLACEMENT Left 01/14/2016   Procedure: CREATION OF LEFT UPPER ARM ARTERIOVENOUS FISTULA;  Surgeon: Chuck Hint, MD;  Location: [facility] OR;  Service: Vascular;  Laterality: Left;   BASCILIC VEIN TRANSPOSITION Right 08/22/2014   Procedure: RIGHT UPPER ARM BASCILIC VEIN TRANSPOSITION;  Surgeon: Chuck Hint, MD;  Location: [facility] OR;  Service: Vascular;  Laterality: Right;   BELOW KNEE LEG AMPUTATION Right 04/26/2016   CARDIAC CATHETERIZATION     CARDIAC DEFIBRILLATOR PLACEMENT  06/27/2013   Sub Q       BY DR Graciela Husbands   CATARACT EXTRACTION W/PHACO Right 08/06/2018   Procedure: CATARACT EXTRACTION PHACO AND INTRAOCULAR LENS PLACEMENT (IOC);  Surgeon: Fabio Pierce, MD;  Location: [facility] ORS;  Service: Ophthalmology;  Laterality: Right;  CDE: 4.06   CATARACT EXTRACTION W/PHACO Left 08/20/2018   Procedure: CATARACT EXTRACTION PHACO AND INTRAOCULAR LENS PLACEMENT (IOC);  Surgeon: Fabio Pierce, MD;  Location: [facility] ORS;  Service: Ophthalmology;  Laterality: Left;  CDE: 6.76   COLONOSCOPY WITH PROPOFOL N/A 07/22/2015   Procedure: COLONOSCOPY WITH PROPOFOL;  Surgeon: Sherrilyn Rist, MD;  Location: [facility] ENDOSCOPY;  Service: Gastroenterology;  Laterality: N/A;   CORONARY LITHOTRIPSY N/A 04/14/2023   Procedure: CORONARY LITHOTRIPSY;  Surgeon: Marykay Lex, MD;  Location: [facility] INVASIVE CV LAB;  Service: Cardiovascular;  Laterality: N/A;  RCA   CORONARY STENT INTERVENTION N/A 04/12/2023   Procedure: CORONARY STENT INTERVENTION;  Surgeon: Swaziland, Peter M, MD;  Location: [facility] INVASIVE CV LAB;  Service: Cardiovascular;   Laterality: N/A;   CORONARY STENT INTERVENTION N/A 04/14/2023   Procedure: CORONARY STENT INTERVENTION;  Surgeon: Marykay Lex, MD;  Location: [facility] INVASIVE CV LAB;  Service: Cardiovascular;  Laterality: N/A;   FEMORAL-POPLITEAL BYPASS GRAFT Right 03/31/2016   Procedure: BYPASS GRAFT FEMORAL-POPLITEAL ARTERY USING RIGHT GREATER SAPHENOUS NONREVERSED VEIN;  Surgeon: Chuck Hint, MD;  Location: [facility] OR;  Service: Vascular;  Laterality: Right;   HERNIA REPAIR     I & D EXTREMITY Right 03/31/2016   Procedure: IRRIGATION AND DEBRIDEMENT FOOT;  Surgeon: Chuck Hint, MD;  Location: [facility] OR;  Service: Vascular;  Laterality: Right;   IMPLANTABLE CARDIOVERTER DEFIBRILLATOR IMPLANT N/A 06/27/2013   Procedure: SUB Q ICD;  Surgeon: Duke Salvia, MD;  Location: [facility] CATH LAB;  Service: Cardiovascular;  Laterality: N/A;   INTRAOPERATIVE ARTERIOGRAM Right 03/31/2016   Procedure: INTRA OPERATIVE ARTERIOGRAM;  Surgeon: Chuck Hint, MD;  Location: [facility] OR;  Service: Vascular;  Laterality: Right;   IR GENERIC HISTORICAL Right 11/30/2015   IR THROMBECTOMY AV FISTULA W/THROMBOLYSIS/PTA INC/SHUNT/IMG RIGHT 11/30/2015 Irish Lack, MD [facility]   IR GENERIC HISTORICAL  11/30/2015   IR US GUIDE VASC ACCESS RIGHT 11/30/2015 Irish Lack, MD [facility]   IR GENERIC HISTORICAL Right 12/15/2015   IR THROMBECTOMY AV FISTULA W/THROMBOLYSIS/PTA/STENT INC/SHUNT/IMG RT 12/15/2015 Oley Balm, MD [facility]   IR GENERIC HISTORICAL  12/15/2015   IR US GUIDE VASC ACCESS RIGHT 12/15/2015 Oley Balm, MD [facility]   IR GENERIC HISTORICAL  12/28/2015   IR FLUORO GUIDE CV LINE RIGHT 12/28/2015 Jolaine Click, MD [facility]   IR GENERIC HISTORICAL  12/28/2015   IR US GUIDE VASC ACCESS RIGHT 12/28/2015 Jolaine Click, MD [facility]   LEFT A ND RIGHT HEART CATH  01/30/2013   DR Jones Broom   LEFT AND RIGHT HEART CATHETERIZATION WITH CORONARY ANGIOGRAM N/A 01/30/2013   Procedure: LEFT AND RIGHT  HEART CATHETERIZATION WITH CORONARY ANGIOGRAM;  Surgeon: Dolores Patty, MD;  Location: [facility] CATH LAB;  Service: Cardiovascular;  Laterality: N/A;   LEFT HEART CATH AND CORONARY ANGIOGRAPHY N/A 04/12/2023   Procedure: LEFT  HEART CATH AND CORONARY ANGIOGRAPHY;  Surgeon: Swaziland, Peter M, MD;  Location: [facility] INVASIVE CV LAB;  Service: Cardiovascular;  Laterality: N/A;   PERIPHERAL VASCULAR CATHETERIZATION Right 01/26/2015   Procedure: A/V Fistulagram;  Surgeon: Dannis Dy, MD;  Location: [facility] INVASIVE CV LAB;  Service: Cardiovascular;  Laterality: Right;   reapea urethral surgery for recurrent obstruction  2011   TOTAL KNEE ARTHROPLASTY Right 2007   VEIN HARVEST Right 03/31/2016   Procedure: RIGHT GREATER SAPHENOUS VEIN HARVEST;  Surgeon: Dannis Dy, MD;  Location: [facility] OR;  Service: Vascular;  Laterality: Right;    FAMILY HISTORY: Family History  Problem Relation Age of Onset   Bladder Cancer Mother    Alcohol abuse Father    Melanoma Father    Stroke Maternal Grandmother    Heart Problems Maternal Grandmother        unknown   Diabetes Maternal Grandmother    Heart disease Maternal Grandfather    Prostate cancer Maternal Grandfather     SOCIAL HISTORY: Social History   Socioeconomic History   Marital status: Married    Spouse name: Not on file   Number of children: 0   Years of education: Not on file   Highest education level: Not on file  Occupational History   Not on file  Tobacco Use   Smoking status: Former    Current packs/day: 0.00    Average packs/day: 2.0 packs/day for 32.0 years (64.0 ttl pk-yrs)    Types: Cigarettes    Start date: 05/11/1978    Quit date: 05/11/2010    Years since quitting: 12.9   Smokeless tobacco: Never  Vaping Use   Vaping status: Never Used  Substance and Sexual Activity   Alcohol use: No   Drug use: No   Sexual activity: Yes  Other Topics Concern   Not on file  Social History Narrative   Works at [employer] as a Therapist, music       Right handed   Wears glasses    Drinks 10 oz coffee daily   Drinks 2 sodas per day      Pt disabled    Lives with wife    Social Drivers of Corporate investment banker Strain: Low Risk  (02/03/2023)   Overall Financial Resource Strain (CARDIA)    Difficulty of Paying Living Expenses: Not hard at all  Food Insecurity: No Food Insecurity (04/17/2023)   Hunger Vital Sign    Worried About Running Out of Food in the Last Year: Never true    Ran Out of Food in the Last Year: Never true  Transportation Needs: No Transportation Needs (04/17/2023)   PRAPARE - Administrator, Civil Service (Medical): No    Lack of Transportation (Non-Medical): No  Physical Activity: Inactive (02/03/2023)   Exercise Vital Sign    Days of Exercise per Week: 0 days    Minutes of Exercise per Session: 0 min  Stress: No Stress Concern Present (02/03/2023)   [employer] of Occupational Health - Occupational Stress Questionnaire    Feeling of Stress : Not at all  Social Connections: Socially Integrated (03/07/2023)   Social Connection and Isolation Panel [NHANES]    Frequency of Communication with Friends and Family: More than three times a week    Frequency of Social Gatherings with Friends and Family: More than three times a week    Attends Religious Services: More than 4 times per year    Active Member of [employer] or Organizations:  Yes    Attends Club or Organization Meetings: More than 4 times per year    Marital Status: Married  Catering manager Violence: Not At Risk (04/17/2023)   Humiliation, Afraid, Rape, and Kick questionnaire    Fear of Current or Ex-Partner: No    Emotionally Abused: No    Physically Abused: No    Sexually Abused: No       PHYSICAL EXAM  Vitals:   04/24/23 0826  Pulse: 78  SpO2: 99%  Weight: 197 lb (89.4 kg)  Height: 5\' 10"  (1.778 m)   BP 100/55 right arm  Body mass index is 28.27 kg/m.   General: The patient is well-developed and well-nourished and in  no acute distress.  He has bilateral leg prosthesis lateral BKA)  HEENT:  Head is [place]/AT.  Sclera are anicteric.    Skin: Extremities are without rash or  edema.  Musculoskeletal:  Back is nontender  Neurologic Exam  Mental status: The patient is alert and oriented x 3 at the time of the examination. The patient has apparent normal recent and remote memory, with an apparently normal attention span and concentration ability.   Speech is normal.  Cranial nerves: EOM now back to normal. No ptosis.   Visual acuity was fairly symmetric out of either eye independently..  Facial symmetry is present. There is good facial sensation to soft touch bilaterally.Facial strength is normal.  Trapezius and sternocleidomastoid strength is normal. No dysarthria is noted.. No obvious hearing deficits are noted.  Motor:  Muscle bulk is normal.   Tone is normal. Strength is  5 / 5 in arms and proximal legs.  Sensory: Sensory testing is intact to pinprick, soft touch and vibration sensation in the arms    Coordination: Cerebellar testing reveals good finger-nose-finger bilaterally.  Gait and station: Station is normal.   Requires a cane due to bilateral leg prostheses    Romberg not tested  Reflexes: Deep tendon reflexes are symmetric and normal in the arms.       DIAGNOSTIC DATA (LABS, IMAGING, TESTING) - I reviewed patient records, labs, notes, testing and imaging myself where available.  Lab Results  Component Value Date   WBC 11.8 (H) 04/15/2023   HGB 10.6 (L) 04/15/2023   HCT 32.1 (L) 04/15/2023   MCV 89.9 04/15/2023   PLT 194 04/15/2023      Component Value Date/Time   NA 133 (L) 04/15/2023 1141   K 4.6 04/15/2023 1141   CL 98 04/15/2023 1141   CO2 20 (L) 04/15/2023 1141   GLUCOSE 197 (H) 04/15/2023 1141   BUN 55 (H) 04/15/2023 1141   BUN 75 (A) 11/07/2017 0000   CREATININE 10.27 (H) 04/15/2023 1141   CALCIUM 8.9 04/15/2023 1141   CALCIUM 13.6 (HH) 06/04/2015 1300   PROT 7.5 04/10/2023  2210   ALBUMIN 2.8 (L) 04/15/2023 1141   AST 37 04/10/2023 2210   ALT 25 04/10/2023 2210   ALKPHOS 114 04/10/2023 2210   BILITOT 0.8 04/10/2023 2210   GFRNONAA 5 (L) 04/15/2023 1141   GFRAA 8 (L) 09/04/2019 0420   Lab Results  Component Value Date   CHOL 78 04/11/2023   HDL 30 (L) 04/11/2023   LDLCALC 7 04/11/2023   LDLDIRECT 134.0 04/25/2011   TRIG 203 (H) 04/11/2023   CHOLHDL 2.6 04/11/2023   Lab Results  Component Value Date   HGBA1C 6.6 (A) 03/20/2023   Lab Results  Component Value Date   VITAMINB12 901 09/29/2014   Lab Results  Component Value Date   TSH 1.475 08/25/2022       ASSESSMENT AND PLAN  3rd nerve palsy, partial, right  Stenosis of intracranial portion of both internal carotid arteries  Diplopia  Coronary artery disease involving native coronary artery of native heart without angina pectoris  Peripheral vascular disease (HCC)  Paroxysmal atrial fibrillation (HCC)  NSTEMI (non-ST elevated myocardial infarction) (HCC)   3rd nerve palsy resolved without sequela We discussed the importance of keeping vascular risks controlled.   Also advised to eat well and exercise as tolerated (and as rec after MI) Rtc prn new or worsening neurologic issues.    Jermichael Belmares A. Godwin Lat, MD, [facility] 04/24/2023, 8:51 AM Certified in Neurology, Clinical Neurophysiology, Sleep Medicine and Neuroimaging  [facility] [address], [address] [place], [place] [address] [phone]

## 2023-04-25 DIAGNOSIS — N186 End stage renal disease: Secondary | ICD-10-CM | POA: Diagnosis not present

## 2023-04-25 DIAGNOSIS — N250 Renal osteodystrophy: Secondary | ICD-10-CM | POA: Diagnosis not present

## 2023-04-25 DIAGNOSIS — D509 Iron deficiency anemia, unspecified: Secondary | ICD-10-CM | POA: Diagnosis not present

## 2023-04-25 DIAGNOSIS — D631 Anemia in chronic kidney disease: Secondary | ICD-10-CM | POA: Diagnosis not present

## 2023-04-25 DIAGNOSIS — N2581 Secondary hyperparathyroidism of renal origin: Secondary | ICD-10-CM | POA: Diagnosis not present

## 2023-04-25 DIAGNOSIS — Z992 Dependence on renal dialysis: Secondary | ICD-10-CM | POA: Diagnosis not present

## 2023-04-27 ENCOUNTER — Inpatient Hospital Stay (HOSPITAL_COMMUNITY)
Admission: EM | Admit: 2023-04-27 | Discharge: 2023-05-02 | DRG: 377 | Disposition: A | Discharge status: Home Health | Attending: Internal Medicine | Admitting: Internal Medicine

## 2023-04-27 ENCOUNTER — Encounter (HOSPITAL_COMMUNITY): Payer: Self-pay

## 2023-04-27 ENCOUNTER — Other Ambulatory Visit: Payer: Self-pay

## 2023-04-27 DIAGNOSIS — E8779 Other fluid overload: Secondary | ICD-10-CM | POA: Diagnosis not present

## 2023-04-27 DIAGNOSIS — Z8616 Personal history of COVID-19: Secondary | ICD-10-CM

## 2023-04-27 DIAGNOSIS — K921 Melena: Secondary | ICD-10-CM

## 2023-04-27 DIAGNOSIS — E8729 Other acidosis: Secondary | ICD-10-CM | POA: Diagnosis not present

## 2023-04-27 DIAGNOSIS — N189 Chronic kidney disease, unspecified: Secondary | ICD-10-CM | POA: Diagnosis not present

## 2023-04-27 DIAGNOSIS — I428 Other cardiomyopathies: Secondary | ICD-10-CM | POA: Diagnosis not present

## 2023-04-27 DIAGNOSIS — A0472 Enterocolitis due to Clostridium difficile, not specified as recurrent: Secondary | ICD-10-CM | POA: Diagnosis not present

## 2023-04-27 DIAGNOSIS — Z79899 Other long term (current) drug therapy: Secondary | ICD-10-CM

## 2023-04-27 DIAGNOSIS — Z955 Presence of coronary angioplasty implant and graft: Secondary | ICD-10-CM | POA: Diagnosis not present

## 2023-04-27 DIAGNOSIS — R7989 Other specified abnormal findings of blood chemistry: Secondary | ICD-10-CM | POA: Diagnosis not present

## 2023-04-27 DIAGNOSIS — R000 Tachycardia, unspecified: Secondary | ICD-10-CM | POA: Diagnosis not present

## 2023-04-27 DIAGNOSIS — R918 Other nonspecific abnormal finding of lung field: Secondary | ICD-10-CM | POA: Diagnosis not present

## 2023-04-27 DIAGNOSIS — E1122 Type 2 diabetes mellitus with diabetic chronic kidney disease: Secondary | ICD-10-CM | POA: Diagnosis not present

## 2023-04-27 DIAGNOSIS — Z89511 Acquired absence of right leg below knee: Secondary | ICD-10-CM | POA: Diagnosis not present

## 2023-04-27 DIAGNOSIS — Z89512 Acquired absence of left leg below knee: Secondary | ICD-10-CM

## 2023-04-27 DIAGNOSIS — Z961 Presence of intraocular lens: Secondary | ICD-10-CM | POA: Diagnosis present

## 2023-04-27 DIAGNOSIS — E7849 Other hyperlipidemia: Secondary | ICD-10-CM | POA: Diagnosis not present

## 2023-04-27 DIAGNOSIS — R112 Nausea with vomiting, unspecified: Secondary | ICD-10-CM | POA: Diagnosis not present

## 2023-04-27 DIAGNOSIS — I429 Cardiomyopathy, unspecified: Secondary | ICD-10-CM | POA: Diagnosis not present

## 2023-04-27 DIAGNOSIS — I953 Hypotension of hemodialysis: Secondary | ICD-10-CM | POA: Diagnosis not present

## 2023-04-27 DIAGNOSIS — N2581 Secondary hyperparathyroidism of renal origin: Secondary | ICD-10-CM | POA: Diagnosis present

## 2023-04-27 DIAGNOSIS — J90 Pleural effusion, not elsewhere classified: Secondary | ICD-10-CM | POA: Diagnosis not present

## 2023-04-27 DIAGNOSIS — E1142 Type 2 diabetes mellitus with diabetic polyneuropathy: Secondary | ICD-10-CM | POA: Diagnosis not present

## 2023-04-27 DIAGNOSIS — Z8249 Family history of ischemic heart disease and other diseases of the circulatory system: Secondary | ICD-10-CM

## 2023-04-27 DIAGNOSIS — K920 Hematemesis: Secondary | ICD-10-CM

## 2023-04-27 DIAGNOSIS — D631 Anemia in chronic kidney disease: Secondary | ICD-10-CM | POA: Diagnosis present

## 2023-04-27 DIAGNOSIS — E871 Hypo-osmolality and hyponatremia: Secondary | ICD-10-CM | POA: Diagnosis not present

## 2023-04-27 DIAGNOSIS — R578 Other shock: Secondary | ICD-10-CM | POA: Diagnosis not present

## 2023-04-27 DIAGNOSIS — Z96651 Presence of right artificial knee joint: Secondary | ICD-10-CM | POA: Diagnosis present

## 2023-04-27 DIAGNOSIS — Z87891 Personal history of nicotine dependence: Secondary | ICD-10-CM

## 2023-04-27 DIAGNOSIS — D72829 Elevated white blood cell count, unspecified: Secondary | ICD-10-CM

## 2023-04-27 DIAGNOSIS — R197 Diarrhea, unspecified: Principal | ICD-10-CM

## 2023-04-27 DIAGNOSIS — D649 Anemia, unspecified: Secondary | ICD-10-CM | POA: Diagnosis not present

## 2023-04-27 DIAGNOSIS — Z888 Allergy status to other drugs, medicaments and biological substances status: Secondary | ICD-10-CM

## 2023-04-27 DIAGNOSIS — I132 Hypertensive heart and chronic kidney disease with heart failure and with stage 5 chronic kidney disease, or end stage renal disease: Secondary | ICD-10-CM | POA: Diagnosis not present

## 2023-04-27 DIAGNOSIS — I252 Old myocardial infarction: Secondary | ICD-10-CM

## 2023-04-27 DIAGNOSIS — Z7902 Long term (current) use of antithrombotics/antiplatelets: Secondary | ICD-10-CM

## 2023-04-27 DIAGNOSIS — K2951 Unspecified chronic gastritis with bleeding: Secondary | ICD-10-CM | POA: Diagnosis not present

## 2023-04-27 DIAGNOSIS — I2510 Atherosclerotic heart disease of native coronary artery without angina pectoris: Secondary | ICD-10-CM | POA: Diagnosis not present

## 2023-04-27 DIAGNOSIS — E1151 Type 2 diabetes mellitus with diabetic peripheral angiopathy without gangrene: Secondary | ICD-10-CM | POA: Diagnosis present

## 2023-04-27 DIAGNOSIS — E872 Acidosis, unspecified: Secondary | ICD-10-CM | POA: Diagnosis not present

## 2023-04-27 DIAGNOSIS — Z7982 Long term (current) use of aspirin: Secondary | ICD-10-CM

## 2023-04-27 DIAGNOSIS — K2950 Unspecified chronic gastritis without bleeding: Secondary | ICD-10-CM | POA: Diagnosis not present

## 2023-04-27 DIAGNOSIS — K922 Gastrointestinal hemorrhage, unspecified: Secondary | ICD-10-CM | POA: Diagnosis present

## 2023-04-27 DIAGNOSIS — I491 Atrial premature depolarization: Secondary | ICD-10-CM | POA: Diagnosis not present

## 2023-04-27 DIAGNOSIS — I480 Paroxysmal atrial fibrillation: Secondary | ICD-10-CM | POA: Diagnosis not present

## 2023-04-27 DIAGNOSIS — A0471 Enterocolitis due to Clostridium difficile, recurrent: Secondary | ICD-10-CM | POA: Diagnosis not present

## 2023-04-27 DIAGNOSIS — Z992 Dependence on renal dialysis: Secondary | ICD-10-CM | POA: Diagnosis not present

## 2023-04-27 DIAGNOSIS — E860 Dehydration: Secondary | ICD-10-CM | POA: Diagnosis present

## 2023-04-27 DIAGNOSIS — Z9841 Cataract extraction status, right eye: Secondary | ICD-10-CM

## 2023-04-27 DIAGNOSIS — Z794 Long term (current) use of insulin: Secondary | ICD-10-CM | POA: Diagnosis not present

## 2023-04-27 DIAGNOSIS — Z7901 Long term (current) use of anticoagulants: Secondary | ICD-10-CM

## 2023-04-27 DIAGNOSIS — R0989 Other specified symptoms and signs involving the circulatory and respiratory systems: Secondary | ICD-10-CM | POA: Diagnosis not present

## 2023-04-27 DIAGNOSIS — K269 Duodenal ulcer, unspecified as acute or chronic, without hemorrhage or perforation: Secondary | ICD-10-CM

## 2023-04-27 DIAGNOSIS — K3189 Other diseases of stomach and duodenum: Secondary | ICD-10-CM | POA: Diagnosis not present

## 2023-04-27 DIAGNOSIS — Z833 Family history of diabetes mellitus: Secondary | ICD-10-CM

## 2023-04-27 DIAGNOSIS — D62 Acute posthemorrhagic anemia: Secondary | ICD-10-CM | POA: Diagnosis not present

## 2023-04-27 DIAGNOSIS — I5022 Chronic systolic (congestive) heart failure: Secondary | ICD-10-CM | POA: Diagnosis not present

## 2023-04-27 DIAGNOSIS — Z87442 Personal history of urinary calculi: Secondary | ICD-10-CM

## 2023-04-27 DIAGNOSIS — Z95810 Presence of automatic (implantable) cardiac defibrillator: Secondary | ICD-10-CM

## 2023-04-27 DIAGNOSIS — E876 Hypokalemia: Secondary | ICD-10-CM | POA: Diagnosis present

## 2023-04-27 DIAGNOSIS — Z885 Allergy status to narcotic agent status: Secondary | ICD-10-CM

## 2023-04-27 DIAGNOSIS — N186 End stage renal disease: Secondary | ICD-10-CM | POA: Diagnosis not present

## 2023-04-27 DIAGNOSIS — R739 Hyperglycemia, unspecified: Secondary | ICD-10-CM | POA: Diagnosis not present

## 2023-04-27 DIAGNOSIS — E861 Hypovolemia: Secondary | ICD-10-CM | POA: Diagnosis present

## 2023-04-27 DIAGNOSIS — E785 Hyperlipidemia, unspecified: Secondary | ICD-10-CM | POA: Diagnosis present

## 2023-04-27 DIAGNOSIS — K625 Hemorrhage of anus and rectum: Principal | ICD-10-CM | POA: Diagnosis present

## 2023-04-27 DIAGNOSIS — E1169 Type 2 diabetes mellitus with other specified complication: Secondary | ICD-10-CM | POA: Diagnosis present

## 2023-04-27 DIAGNOSIS — I5020 Unspecified systolic (congestive) heart failure: Secondary | ICD-10-CM | POA: Diagnosis not present

## 2023-04-27 DIAGNOSIS — I10 Essential (primary) hypertension: Secondary | ICD-10-CM | POA: Diagnosis not present

## 2023-04-27 DIAGNOSIS — I9589 Other hypotension: Secondary | ICD-10-CM | POA: Diagnosis present

## 2023-04-27 DIAGNOSIS — I4891 Unspecified atrial fibrillation: Secondary | ICD-10-CM | POA: Diagnosis not present

## 2023-04-27 DIAGNOSIS — K2970 Gastritis, unspecified, without bleeding: Secondary | ICD-10-CM | POA: Diagnosis not present

## 2023-04-27 DIAGNOSIS — I959 Hypotension, unspecified: Secondary | ICD-10-CM | POA: Diagnosis not present

## 2023-04-27 DIAGNOSIS — Z01810 Encounter for preprocedural cardiovascular examination: Secondary | ICD-10-CM | POA: Diagnosis not present

## 2023-04-27 DIAGNOSIS — I739 Peripheral vascular disease, unspecified: Secondary | ICD-10-CM | POA: Diagnosis present

## 2023-04-27 DIAGNOSIS — I499 Cardiac arrhythmia, unspecified: Secondary | ICD-10-CM | POA: Diagnosis not present

## 2023-04-27 DIAGNOSIS — Z9842 Cataract extraction status, left eye: Secondary | ICD-10-CM

## 2023-04-27 DIAGNOSIS — J449 Chronic obstructive pulmonary disease, unspecified: Secondary | ICD-10-CM | POA: Diagnosis not present

## 2023-04-27 DIAGNOSIS — J309 Allergic rhinitis, unspecified: Secondary | ICD-10-CM | POA: Diagnosis present

## 2023-04-27 LAB — CBC
HCT: 17.4 % — ABNORMAL LOW (ref 39.0–52.0)
Hemoglobin: 5.6 g/dL — CL (ref 13.0–17.0)
MCH: 29.9 pg (ref 26.0–34.0)
MCHC: 32.2 g/dL (ref 30.0–36.0)
MCV: 93 fL (ref 80.0–100.0)
Platelets: 230 10*3/uL (ref 150–400)
RBC: 1.87 MIL/uL — ABNORMAL LOW (ref 4.22–5.81)
RDW: 15.4 % (ref 11.5–15.5)
WBC: 21 10*3/uL — ABNORMAL HIGH (ref 4.0–10.5)
nRBC: 0 % (ref 0.0–0.2)

## 2023-04-27 LAB — COMPREHENSIVE METABOLIC PANEL WITH GFR
ALT: 22 U/L (ref 0–44)
AST: 28 U/L (ref 15–41)
Albumin: 2.6 g/dL — ABNORMAL LOW (ref 3.5–5.0)
Alkaline Phosphatase: 62 U/L (ref 38–126)
Anion gap: 20 — ABNORMAL HIGH (ref 5–15)
BUN: 95 mg/dL — ABNORMAL HIGH (ref 8–23)
CO2: 19 mmol/L — ABNORMAL LOW (ref 22–32)
Calcium: 8.3 mg/dL — ABNORMAL LOW (ref 8.9–10.3)
Chloride: 91 mmol/L — ABNORMAL LOW (ref 98–111)
Creatinine, Ser: 8.2 mg/dL — ABNORMAL HIGH (ref 0.61–1.24)
GFR, Estimated: 7 mL/min — ABNORMAL LOW (ref >60)
Glucose, Bld: 197 mg/dL — ABNORMAL HIGH (ref 70–99)
Potassium: 3.5 mmol/L (ref 3.5–5.1)
Sodium: 130 mmol/L — ABNORMAL LOW (ref 135–145)
Total Bilirubin: 0.4 mg/dL (ref 0.0–1.2)
Total Protein: 5.9 g/dL — ABNORMAL LOW (ref 6.5–8.1)

## 2023-04-27 LAB — POC OCCULT BLOOD, ED: Fecal Occult Bld: POSITIVE — AB

## 2023-04-27 LAB — TROPONIN I (HIGH SENSITIVITY)
Troponin I (High Sensitivity): 345 ng/L (ref <18)
Troponin I (High Sensitivity): 387 ng/L (ref <18)

## 2023-04-27 LAB — CBG MONITORING, ED
Glucose-Capillary: 195 mg/dL — ABNORMAL HIGH (ref 70–99)
Glucose-Capillary: 111 mg/dL — ABNORMAL HIGH (ref 70–99)
Glucose-Capillary: 153 mg/dL — ABNORMAL HIGH (ref 70–99)

## 2023-04-27 LAB — LIPASE, BLOOD: Lipase: 34 U/L (ref 11–51)

## 2023-04-27 LAB — PREPARE RBC (CROSSMATCH)

## 2023-04-27 MED ORDER — PANTOPRAZOLE SODIUM 40 MG IV SOLR
40.0000 mg | INTRAVENOUS | Status: AC
  Administered 2023-04-27 (×2): 40 mg via INTRAVENOUS
  Filled 2023-04-27: qty 10

## 2023-04-27 MED ORDER — INSULIN ASPART 100 UNIT/ML IJ SOLN
0.0000 [IU] | Freq: Three times a day (TID) | INTRAMUSCULAR | Status: DC
  Administered 2023-04-28: 5 [IU] via SUBCUTANEOUS
  Administered 2023-04-28: 8 [IU] via SUBCUTANEOUS
  Administered 2023-04-29: 2 [IU] via SUBCUTANEOUS
  Administered 2023-04-29: 11 [IU] via SUBCUTANEOUS
  Administered 2023-04-30: 8 [IU] via SUBCUTANEOUS
  Administered 2023-04-30: 3 [IU] via SUBCUTANEOUS
  Administered 2023-04-30: 5 [IU] via SUBCUTANEOUS
  Administered 2023-05-01: 2 [IU] via SUBCUTANEOUS
  Administered 2023-05-01: 8 [IU] via SUBCUTANEOUS
  Administered 2023-05-01: 3 [IU] via SUBCUTANEOUS

## 2023-04-27 MED ORDER — ONDANSETRON HCL 4 MG/2ML IJ SOLN: 4.0000 mg | Freq: Four times a day (QID) | INTRAMUSCULAR | Status: DC | PRN

## 2023-04-27 MED ORDER — ALBUTEROL SULFATE (2.5 MG/3ML) 0.083% IN NEBU: 2.5000 mg | INHALATION_SOLUTION | Freq: Four times a day (QID) | RESPIRATORY_TRACT | Status: DC | PRN

## 2023-04-27 MED ORDER — MIDODRINE HCL 5 MG PO TABS
10.0000 mg | ORAL_TABLET | ORAL | Status: DC
  Administered 2023-04-28: 10 mg via ORAL
  Filled 2023-04-27: qty 2

## 2023-04-27 MED ORDER — INSULIN GLARGINE-YFGN 100 UNIT/ML ~~LOC~~ SOLN: 14.0000 [IU] | Freq: Every day | SUBCUTANEOUS | Status: DC

## 2023-04-27 MED ORDER — CHLORHEXIDINE GLUCONATE CLOTH 2 % EX PADS
6.0000 | MEDICATED_PAD | Freq: Every day | CUTANEOUS | Status: DC
  Administered 2023-04-29 – 2023-04-30 (×2): 6 via TOPICAL

## 2023-04-27 MED ORDER — ACETAMINOPHEN 650 MG RE SUPP: 650.0000 mg | Freq: Four times a day (QID) | RECTAL | Status: DC | PRN

## 2023-04-27 MED ORDER — ACETAMINOPHEN 325 MG PO TABS
650.0000 mg | ORAL_TABLET | Freq: Four times a day (QID) | ORAL | Status: DC | PRN
  Administered 2023-04-29: 650 mg via ORAL
  Filled 2023-04-27: qty 2

## 2023-04-27 MED ORDER — SODIUM CHLORIDE 0.9% FLUSH: 10.0000 mL | INTRAVENOUS | Status: DC | PRN

## 2023-04-27 MED ORDER — PANTOPRAZOLE SODIUM 40 MG IV SOLR
40.0000 mg | Freq: Four times a day (QID) | INTRAVENOUS | Status: DC
  Administered 2023-04-27 – 2023-04-30 (×9): 40 mg via INTRAVENOUS
  Filled 2023-04-27 (×9): qty 10

## 2023-04-27 MED ORDER — RENA-VITE PO TABS
1.0000 | ORAL_TABLET | Freq: Every day | ORAL | Status: DC
  Administered 2023-04-27 – 2023-05-01 (×5): 1 via ORAL
  Filled 2023-04-27 (×6): qty 1

## 2023-04-27 MED ORDER — CHLORHEXIDINE GLUCONATE CLOTH 2 % EX PADS
6.0000 | MEDICATED_PAD | Freq: Every day | CUTANEOUS | Status: DC
  Administered 2023-04-29 – 2023-05-02 (×5): 6 via TOPICAL

## 2023-04-27 MED ORDER — ATORVASTATIN CALCIUM 80 MG PO TABS
80.0000 mg | ORAL_TABLET | Freq: Every evening | ORAL | Status: DC
  Administered 2023-04-28 – 2023-05-01 (×4): 80 mg via ORAL
  Filled 2023-04-27 (×4): qty 1

## 2023-04-27 MED ORDER — ONDANSETRON HCL 4 MG PO TABS
4.0000 mg | ORAL_TABLET | Freq: Four times a day (QID) | ORAL | Status: DC | PRN
Start: 2023-04-27 — End: 2023-05-02

## 2023-04-27 MED ORDER — INSULIN GLARGINE-YFGN 100 UNIT/ML ~~LOC~~ SOLN
14.0000 [IU] | Freq: Every day | SUBCUTANEOUS | Status: DC
  Administered 2023-04-28 – 2023-05-02 (×5): 14 [IU] via SUBCUTANEOUS
  Filled 2023-04-27 (×6): qty 0.14

## 2023-04-27 MED ORDER — SODIUM CHLORIDE 0.9% IV SOLUTION: Freq: Once | INTRAVENOUS | Status: DC

## 2023-04-27 MED ORDER — PANTOPRAZOLE SODIUM 40 MG IV SOLR
40.0000 mg | Freq: Two times a day (BID) | INTRAVENOUS | Status: DC
  Filled 2023-04-27: qty 10

## 2023-04-27 MED ORDER — SODIUM CHLORIDE 0.9% FLUSH
3.0000 mL | Freq: Two times a day (BID) | INTRAVENOUS | Status: DC
  Administered 2023-04-28 – 2023-05-02 (×8): 3 mL via INTRAVENOUS

## 2023-04-27 MED ORDER — GABAPENTIN 300 MG PO CAPS
300.0000 mg | ORAL_CAPSULE | Freq: Every day | ORAL | Status: DC
  Administered 2023-04-27 – 2023-05-01 (×5): 300 mg via ORAL
  Filled 2023-04-27 (×5): qty 1

## 2023-04-27 MED ORDER — RENA-VITE PO TABS: 1.0000 | ORAL_TABLET | Freq: Every day | ORAL | Status: DC

## 2023-04-27 NOTE — Consult Note (Signed)
 Renal Service Consult Note [facility]  Anthony Bullock 04/27/2023 Maree Krabbe, MD Requesting Physician: Dr. Arlyss Queen  Reason for Consult: ESRD pt w/ GI bleed HPI: The patient is a 63 y.o. year-old w/ PMH as below who presented to ED complaining of nausea vomiting and diarrhea for 3 days.  Patient was here recently for cardiac stent placement.  Patient is on dialysis in the [place], [place], and does not miss his treatments.  In the ED blood pressure was 103/50, heart rate 96/102, respiratory rate 15 and afebrile.  On room air at 100%.  Labs showed BUN 95, creatinine 8.2, K+ 3.5, Hgb 5.6.  Patient has been passing dark stools and is being admitted for upper GI bleed.  PRBCs have been ordered.  We are asked to see for dialysis.   Pt seen in ED room.  He goes to [employer], per his wife he never misses.  He denies shortness of breath.  He has been pale and listless for the last couple days.  Last dialysis on Tuesday they took 1.3 off which is not as much as they usually take off because he was weak.   ROS - denies CP, no joint pain, no HA, no blurry vision, no rash, no diarrhea, no nausea/ vomiting  PMH: Status post AICD Chronic systolic heart failure COPD DM type II ESRD on HD CAD history of cardiac stent HL HTN NICM PAD with bilateral BKA  Past Surgical History  Past Surgical History:  Procedure Laterality Date   ABDOMINAL AORTOGRAM W/LOWER EXTREMITY N/A 03/30/2016   Procedure: Abdominal Aortogram w/Lower Extremity;  Surgeon: Chuck Hint, MD;  Location: [facility] INVASIVE CV LAB;  Service: Cardiovascular;  Laterality: N/A;   AMPUTATION Right 04/26/2016   Procedure: Right Below Knee Amputation;  Surgeon: Nadara Mustard, MD;  Location: [facility] OR;  Service: Orthopedics;  Laterality: Right;   AMPUTATION Left 08/21/2019   Procedure: LEFT FOOT 5TH RAY AMPUTATION;  Surgeon: Nadara Mustard, MD;  Location: [facility] OR;  Service: Orthopedics;  Laterality: Left;   AMPUTATION Left 11/13/2019    Procedure: LEFT BELOW KNEE AMPUTATION;  Surgeon: Nadara Mustard, MD;  Location: [facility] OR;  Service: Orthopedics;  Laterality: Left;   AV FISTULA PLACEMENT Right 09/08/2015   Procedure: INSERTION OF 4-32mm x 45cm  ARTERIOVENOUS (AV) GORE-TEX GRAFT RIGHT UPPER  ARM;  Surgeon: Chuck Hint, MD;  Location: [facility] OR;  Service: Vascular;  Laterality: Right;   AV FISTULA PLACEMENT Left 01/14/2016   Procedure: CREATION OF LEFT UPPER ARM ARTERIOVENOUS FISTULA;  Surgeon: Chuck Hint, MD;  Location: [facility] OR;  Service: Vascular;  Laterality: Left;   BASCILIC VEIN TRANSPOSITION Right 08/22/2014   Procedure: RIGHT UPPER ARM BASCILIC VEIN TRANSPOSITION;  Surgeon: Chuck Hint, MD;  Location: [facility] OR;  Service: Vascular;  Laterality: Right;   BELOW KNEE LEG AMPUTATION Right 04/26/2016   CARDIAC CATHETERIZATION     CARDIAC DEFIBRILLATOR PLACEMENT  06/27/2013   Sub Q       BY DR Graciela Husbands   CATARACT EXTRACTION W/PHACO Right 08/06/2018   Procedure: CATARACT EXTRACTION PHACO AND INTRAOCULAR LENS PLACEMENT (IOC);  Surgeon: Fabio Pierce, MD;  Location: [facility] ORS;  Service: Ophthalmology;  Laterality: Right;  CDE: 4.06   CATARACT EXTRACTION W/PHACO Left 08/20/2018   Procedure: CATARACT EXTRACTION PHACO AND INTRAOCULAR LENS PLACEMENT (IOC);  Surgeon: Fabio Pierce, MD;  Location: [facility] ORS;  Service: Ophthalmology;  Laterality: Left;  CDE: 6.76   COLONOSCOPY WITH PROPOFOL N/A 07/22/2015  Procedure: COLONOSCOPY WITH PROPOFOL;  Surgeon: Sherrilyn Rist, MD;  Location: [facility] ENDOSCOPY;  Service: Gastroenterology;  Laterality: N/A;   CORONARY LITHOTRIPSY N/A 04/14/2023   Procedure: CORONARY LITHOTRIPSY;  Surgeon: Marykay Lex, MD;  Location: [facility] INVASIVE CV LAB;  Service: Cardiovascular;  Laterality: N/A;  RCA   CORONARY STENT INTERVENTION N/A 04/12/2023   Procedure: CORONARY STENT INTERVENTION;  Surgeon: Swaziland, Peter M, MD;  Location: [facility] INVASIVE CV LAB;  Service: Cardiovascular;  Laterality: N/A;   CORONARY STENT  INTERVENTION N/A 04/14/2023   Procedure: CORONARY STENT INTERVENTION;  Surgeon: Marykay Lex, MD;  Location: [facility] INVASIVE CV LAB;  Service: Cardiovascular;  Laterality: N/A;   FEMORAL-POPLITEAL BYPASS GRAFT Right 03/31/2016   Procedure: BYPASS GRAFT FEMORAL-POPLITEAL ARTERY USING RIGHT GREATER SAPHENOUS NONREVERSED VEIN;  Surgeon: Chuck Hint, MD;  Location: [facility] OR;  Service: Vascular;  Laterality: Right;   HERNIA REPAIR     I & D EXTREMITY Right 03/31/2016   Procedure: IRRIGATION AND DEBRIDEMENT FOOT;  Surgeon: Chuck Hint, MD;  Location: [facility] OR;  Service: Vascular;  Laterality: Right;   IMPLANTABLE CARDIOVERTER DEFIBRILLATOR IMPLANT N/A 06/27/2013   Procedure: SUB Q ICD;  Surgeon: Duke Salvia, MD;  Location: [facility] CATH LAB;  Service: Cardiovascular;  Laterality: N/A;   INTRAOPERATIVE ARTERIOGRAM Right 03/31/2016   Procedure: INTRA OPERATIVE ARTERIOGRAM;  Surgeon: Chuck Hint, MD;  Location: [facility] OR;  Service: Vascular;  Laterality: Right;   IR GENERIC HISTORICAL Right 11/30/2015   IR THROMBECTOMY AV FISTULA W/THROMBOLYSIS/PTA INC/SHUNT/IMG RIGHT 11/30/2015 Irish Lack, MD [facility]   IR GENERIC HISTORICAL  11/30/2015   IR US GUIDE VASC ACCESS RIGHT 11/30/2015 Irish Lack, MD [facility]   IR GENERIC HISTORICAL Right 12/15/2015   IR THROMBECTOMY AV FISTULA W/THROMBOLYSIS/PTA/STENT INC/SHUNT/IMG RT 12/15/2015 Oley Balm, MD [facility]   IR GENERIC HISTORICAL  12/15/2015   IR US GUIDE VASC ACCESS RIGHT 12/15/2015 Oley Balm, MD [facility]   IR GENERIC HISTORICAL  12/28/2015   IR FLUORO GUIDE CV LINE RIGHT 12/28/2015 Jolaine Click, MD [facility]   IR GENERIC HISTORICAL  12/28/2015   IR US GUIDE VASC ACCESS RIGHT 12/28/2015 Jolaine Click, MD [facility]   LEFT A ND RIGHT HEART CATH  01/30/2013   DR Jones Broom   LEFT AND RIGHT HEART CATHETERIZATION WITH CORONARY ANGIOGRAM N/A 01/30/2013   Procedure: LEFT AND RIGHT HEART CATHETERIZATION WITH CORONARY  ANGIOGRAM;  Surgeon: Dolores Patty, MD;  Location: [facility] CATH LAB;  Service: Cardiovascular;  Laterality: N/A;   LEFT HEART CATH AND CORONARY ANGIOGRAPHY N/A 04/12/2023   Procedure: LEFT HEART CATH AND CORONARY ANGIOGRAPHY;  Surgeon: Swaziland, Peter M, MD;  Location: [facility] INVASIVE CV LAB;  Service: Cardiovascular;  Laterality: N/A;   PERIPHERAL VASCULAR CATHETERIZATION Right 01/26/2015   Procedure: A/V Fistulagram;  Surgeon: Chuck Hint, MD;  Location: [facility] INVASIVE CV LAB;  Service: Cardiovascular;  Laterality: Right;   reapea urethral surgery for recurrent obstruction  2011   TOTAL KNEE ARTHROPLASTY Right 2007   VEIN HARVEST Right 03/31/2016   Procedure: RIGHT GREATER SAPHENOUS VEIN HARVEST;  Surgeon: Chuck Hint, MD;  Location: [facility] OR;  Service: Vascular;  Laterality: Right;   Family History  Family History  Problem Relation Age of Onset   Bladder Cancer Mother    Alcohol abuse Father    Melanoma Father    Stroke Maternal Grandmother    Heart Problems Maternal Grandmother        unknown   Diabetes Maternal Grandmother  Heart disease Maternal Grandfather    Prostate cancer Maternal Grandfather    Social History  reports that he quit smoking about 12 years ago. His smoking use included cigarettes. He started smoking about 44 years ago. He has a 64 pack-year smoking history. He has never used smokeless tobacco. He reports that he does not drink alcohol and does not use drugs. Allergies  Allergies  Allergen Reactions   Epoetin Alfa Other (See Comments)    Unknown    Ferumoxytol Other (See Comments)    Unknown    Morphine Sulfate Rash and Other (See Comments)    Itches all over, red spots   Home medications Prior to Admission medications   Medication Sig Start Date End Date Taking? Authorizing Provider  acetaminophen (TYLENOL) 325 MG tablet Take 1-2 tablets (325-650 mg total) by mouth every 4 (four) hours as needed for mild pain. 11/29/19   Love, Renay Carota, PA-C   apixaban (ELIQUIS) 5 MG TABS tablet Take 1 tablet (5 mg total) by mouth 2 (two) times daily. 12/21/22   Burchette, Marijean Shouts, MD  aspirin EC 81 MG tablet Take 1 tablet (81 mg total) by mouth daily. Swallow whole. 04/15/23   Hongalgi, Anand D, MD  atorvastatin (LIPITOR) 80 MG tablet TAKE 1 TABLET BY MOUTH AT BEDTIME Patient taking differently: Take 80 mg by mouth daily. 06/24/22   Burchette, Marijean Shouts, MD  carvedilol (COREG) 12.5 MG tablet Take 1 tablet (12.5 mg total) by mouth 2 (two) times daily. 01/25/23   Burchette, Marijean Shouts, MD  cinacalcet (SENSIPAR) 30 MG tablet Take 1 tablet (30 mg total) by mouth Every Tuesday,Thursday,and Saturday with dialysis. 11/30/19   Love, Renay Carota, PA-C  clopidogrel (PLAVIX) 75 MG tablet Take 1 tablet (75 mg total) by mouth daily with breakfast. 04/16/23   Hongalgi, Thomasene Flemings, MD  Continuous Glucose Receiver (FREESTYLE LIBRE 3 READER) DEVI Use to check blood glucose TID 08/11/22   Burchette, Marijean Shouts, MD  Continuous Glucose Sensor (FREESTYLE LIBRE 3 PLUS SENSOR) MISC Change sensor every 15 days. Use as directed to check glucose daily 12/21/22   Burchette, Marijean Shouts, MD  gabapentin (NEURONTIN) 300 MG capsule Take 1 capsule (300 mg total) by mouth at bedtime. 04/15/23   Hongalgi, Anand D, MD  glucose blood test strip Check 1 time daily. E11.9 One Touch Ultra Blue Test Strips 03/10/14   Burchette, Marijean Shouts, MD  HUMALOG KWIKPEN 200 UNIT/ML KwikPen INJECT A MAXIMUM OF 28 UNITS SUBCUTANEOUSLY TWICE DAILY WITH LUNCH AND SUPPER PER SLIDING SCALE. APPOINTMENT REQUIRED FOR FUTURE REFILLS 11/11/22   Burchette, Marijean Shouts, MD  Insulin Pen Needle (BD PEN NEEDLE NANO U/F) 32G X 4 MM MISC USE 1 PEN NEEDLE SUBCUTANEOUSLY WITH INSULIN 4 TIMES DAILY 12/04/19   Burchette, Marijean Shouts, MD  LANTUS SOLOSTAR 100 UNIT/ML Solostar Pen INJECT 12 UNITS SUBCUTANEOUSLY AT BEDTIME Patient taking differently: Inject 14 Units into the skin at bedtime. 07/15/22   Burchette, Marijean Shouts, MD  midodrine (PROAMATINE) 10 MG tablet Take 10  mg by mouth 3 (three) times a week. EVERY TUESDAY, THURSDAY, AND SATURDAY 08/04/20   [provider]  multivitamin (RENA-VIT) TABS tablet Take 1 tablet by mouth once daily Patient taking differently: Take 1 tablet by mouth daily. 07/06/21   Burchette, Marijean Shouts, MD  nitroGLYCERIN (NITROSTAT) 0.4 MG SL tablet Place 1 tablet (0.4 mg total) under the tongue every 5 (five) minutes as needed for chest pain. 04/21/23   Burchette, Marijean Shouts, MD  Olopatadine HCl 0.2 %  SOLN Place 1 drop into both eyes daily as needed (for allergies).     [provider]  sevelamer (RENAGEL) 800 MG tablet Take 1,600 mg by mouth 3 (three) times daily with meals.    [provider]  VENTOLIN HFA 108 (90 Base) MCG/ACT inhaler Inhale 1-2 puffs into the lungs every 6 (six) hours as needed for wheezing or shortness of breath. 03/20/23   Marquetta Sit, MD     Vitals:   04/27/23 1045 04/27/23 1130 04/27/23 1215 04/27/23 1218  BP: (!) 87/47 (!) 98/49 (!) 93/48 (!) 100/45  Pulse:   (!) 104 (!) 104  Resp: (!) 24 15 (!) 21 10  Temp:   98.7 F (37.1 C) 98.7 F (37.1 C)  TempSrc:   Oral Oral  SpO2:    100%  Weight:      Height:       Exam Gen alert, no distress No rash, cyanosis or gangrene Sclera anicteric, throat clear  No jvd or bruits Chest clear bilat to bases, no rales/ wheezing RRR no MRG Abd soft ntnd no mass or ascites +bs GU normal male MS bilateral BKA Ext no LE or UE edema, no other edema Neuro is alert, Ox 3 , nf    RIJ TDC intact      Renal-related home meds: Coreg 12.5 Mg twice daily Sensipar 30 mg TTS Sevelamer 2 AC 3 times daily Midodrine 10 mg before HD TTS Rena-Vite once daily Others: Neurontin, Lipitor, insulin, Plavix, aspirin, Eliquis    OP HD:  [facility] From 04/14/23 --> 3.5h  B400   85.5kg  1K bath  TDC   Heparin none    Assessment/ Plan: GI bleed: w/ melena, N/V, Hb 5.6. Getting prbc's, GI consulting.  ESRD: on HD TTS. Has not missed HD. HD today/  tonight.  BP: chronic hypotension on midodrine pre HD. BP 90s in ED, not eating, may be dry. Will keep even w/ HD today.   Volume: as above, euvolemic to dry on exam Anemia of esrd: Hb low, getting prbc's, and GI work-up.  Secondary hyperparathyroidism: CCa in range, cont binders w/ meals, add on phos.  PAD sp bilat BKA      Anthony Poag  MD [facility] 04/27/2023, 12:20 PM  Recent Labs  Lab 04/27/23 0404  HGB 5.6*  ALBUMIN 2.6*  CALCIUM 8.3*  CREATININE 8.20*  K 3.5   Inpatient medications:  sodium chloride   Intravenous Once   atorvastatin  80 mg Oral QPM   Chlorhexidine Gluconate Cloth  6 each Topical Daily   gabapentin  300 mg Oral QHS   [START ON 04/28/2023] midodrine  10 mg Oral Once per day on Monday Wednesday Friday   multivitamin  1 tablet Oral Daily   pantoprazole (PROTONIX) IV  40 mg Intravenous Q6H   Followed by   Cecily Cohen ON 04/30/2023] pantoprazole (PROTONIX) IV  40 mg Intravenous Q12H   sodium chloride flush  3 mL Intravenous Q12H    acetaminophen **OR** acetaminophen, albuterol, ondansetron **OR** ondansetron (ZOFRAN) IV, sodium chloride flush

## 2023-04-27 NOTE — H&P (Addendum)
 History and Physical    Patient: Anthony Bullock WUJ:[id number] DOB: Dec 10, 1960 DOA: 04/27/2023 DOS: the patient was seen and examined on 04/27/2023 PCP: Kristian Covey, MD  Patient coming from: Home  Chief Complaint:  Chief Complaint  Patient presents with   Diarrhea   HPI: Anthony Bullock is a 63 y.o. male with medical history significant of hypertension, dyslipidemia, CAD, ICM with LVEF of 40%, PAF, ESRD on HD TTS, diabetes mellitus type 2, PVD s/p bilateral BKA, and anemia who presents with complaints of nausea, vomiting, and diarrhea.  Patient had just recently been hospitalized 3/31-4/5 after presenting with shortness of breath and chest pain.  Treated for a NSTEMI with troponins peaking at around 16,000.  Patient had been placed on IV heparin and taken for cardiac cath undergoing staged coronary PCI.  4/2: S/p cardiac cath with stenting of LAD and first diagonal.   4/4: S/p successful PCI to the RCA.  Echocardiogram noted EF of 40%.  Patient had been on Eliquis prior and thereafter procedure started on aspirin and Plavix. He has been take all of the medications as recommended.  He took the medications last yesterday.  He has been experiencing nausea, vomiting, and diarrhea for the past three days. The vomiting occurred once and was described as dark, resembling 'coffee grounds'.  Patient also reports having dark stools.  There is no prior history of ulcers or gastrointestinal bleeding. He is up to date with his colonoscopy.  Records note last colonoscopy back in 2017 noted 6 mm and a 4 mm sessile polyp in the rectum which were removed and internal hemorrhoids.  In addition he reports that he had a mild nonproductive cough, shortness of breath, fatigue, and some chest discomfort.  He is in need of hemodialysis today.  In the emergency department patient was noted to be afebrile with pulse 95-1 03, blood pressures 103/50 to 118/52, and O2 saturations maintained on room air.  Labs significant  for WBC 21, hemoglobin 5.6, sodium 130, potassium 3.5, CO2 19, BUN 95, creatinine 8.2, anion gap 20, and high-sensitivity troponin 345 ->387.  Stool guaiacs were noted to be positive.  Patient has been typed and screened.  Patient had been given Protonix IV.  Review of Systems: As mentioned in the history of present illness. All other systems reviewed and are negative. Past Medical History:  Diagnosis Date   AICD (automatic cardioverter/defibrillator) present    [employer]   Allergic rhinitis    Anemia    Arthritis    Chronic systolic heart failure (HCC)    a. ECHO (12/2012) EF 25-30%, HK entireanteroseptal myocardium //  b.  EF 25%, diffuse HK, grade 1 diastolic dysfunction, MAC, mild LAE, normal RVSF, trivial pericardial effusion   COPD (chronic obstructive pulmonary disease) (HCC)    Diabetes mellitus type II    Diabetic nephropathy (HCC)    Diabetic neuropathy (HCC)    ESRD on hemodialysis (HCC)    started HD June 2017, goes to [facility] HD unit, Dr Fausto Skillern   History of cardiac catheterization    a.Myoview 1/15:  There is significant left ventricular dysfunction. There may be slight scar at the apex. There is no significant ischemia. LV Ejection Fraction: 27%  //  b. RHC/LHC (1/15) with mean RA 6, PA 47/22 mean 33, mean PCWP 20, PVR 2.5 WU, CI 2.5; 80% dLAD stenosis, 70% diffuse large D.     History of kidney stones    Hyperlipidemia    Hypertension  Kidney stones    NICM (nonischemic cardiomyopathy) (HCC)    Primarily nonischemic.  Echo (12/14) with EF 25-30%.  Echo (3/15) with EF 25%, mild to moderately dilated LV, normal RV size and systolic function.     Osteomyelitis (HCC)    left fifth ray   Pneumonia    Urethral stricture    Wears glasses    Past Surgical History:  Procedure Laterality Date   ABDOMINAL AORTOGRAM W/LOWER EXTREMITY N/A 03/30/2016   Procedure: Abdominal Aortogram w/Lower Extremity;  Surgeon: Chuck Hint, MD;  Location: [facility] INVASIVE CV  LAB;  Service: Cardiovascular;  Laterality: N/A;   AMPUTATION Right 04/26/2016   Procedure: Right Below Knee Amputation;  Surgeon: Nadara Mustard, MD;  Location: [facility] OR;  Service: Orthopedics;  Laterality: Right;   AMPUTATION Left 08/21/2019   Procedure: LEFT FOOT 5TH RAY AMPUTATION;  Surgeon: Nadara Mustard, MD;  Location: [facility] OR;  Service: Orthopedics;  Laterality: Left;   AMPUTATION Left 11/13/2019   Procedure: LEFT BELOW KNEE AMPUTATION;  Surgeon: Nadara Mustard, MD;  Location: [facility] OR;  Service: Orthopedics;  Laterality: Left;   AV FISTULA PLACEMENT Right 09/08/2015   Procedure: INSERTION OF 4-79mm x 45cm  ARTERIOVENOUS (AV) GORE-TEX GRAFT RIGHT UPPER  ARM;  Surgeon: Chuck Hint, MD;  Location: [facility] OR;  Service: Vascular;  Laterality: Right;   AV FISTULA PLACEMENT Left 01/14/2016   Procedure: CREATION OF LEFT UPPER ARM ARTERIOVENOUS FISTULA;  Surgeon: Chuck Hint, MD;  Location: [facility] OR;  Service: Vascular;  Laterality: Left;   BASCILIC VEIN TRANSPOSITION Right 08/22/2014   Procedure: RIGHT UPPER ARM BASCILIC VEIN TRANSPOSITION;  Surgeon: Chuck Hint, MD;  Location: [facility] OR;  Service: Vascular;  Laterality: Right;   BELOW KNEE LEG AMPUTATION Right 04/26/2016   CARDIAC CATHETERIZATION     CARDIAC DEFIBRILLATOR PLACEMENT  06/27/2013   Sub Q       BY DR Graciela Husbands   CATARACT EXTRACTION W/PHACO Right 08/06/2018   Procedure: CATARACT EXTRACTION PHACO AND INTRAOCULAR LENS PLACEMENT (IOC);  Surgeon: Fabio Pierce, MD;  Location: [facility] ORS;  Service: Ophthalmology;  Laterality: Right;  CDE: 4.06   CATARACT EXTRACTION W/PHACO Left 08/20/2018   Procedure: CATARACT EXTRACTION PHACO AND INTRAOCULAR LENS PLACEMENT (IOC);  Surgeon: Fabio Pierce, MD;  Location: [facility] ORS;  Service: Ophthalmology;  Laterality: Left;  CDE: 6.76   COLONOSCOPY WITH PROPOFOL N/A 07/22/2015   Procedure: COLONOSCOPY WITH PROPOFOL;  Surgeon: Sherrilyn Rist, MD;  Location: [facility] ENDOSCOPY;  Service: Gastroenterology;  Laterality:  N/A;   CORONARY LITHOTRIPSY N/A 04/14/2023   Procedure: CORONARY LITHOTRIPSY;  Surgeon: Marykay Lex, MD;  Location: [facility] INVASIVE CV LAB;  Service: Cardiovascular;  Laterality: N/A;  RCA   CORONARY STENT INTERVENTION N/A 04/12/2023   Procedure: CORONARY STENT INTERVENTION;  Surgeon: Swaziland, Peter M, MD;  Location: [facility] INVASIVE CV LAB;  Service: Cardiovascular;  Laterality: N/A;   CORONARY STENT INTERVENTION N/A 04/14/2023   Procedure: CORONARY STENT INTERVENTION;  Surgeon: Marykay Lex, MD;  Location: [facility] INVASIVE CV LAB;  Service: Cardiovascular;  Laterality: N/A;   FEMORAL-POPLITEAL BYPASS GRAFT Right 03/31/2016   Procedure: BYPASS GRAFT FEMORAL-POPLITEAL ARTERY USING RIGHT GREATER SAPHENOUS NONREVERSED VEIN;  Surgeon: Chuck Hint, MD;  Location: [facility] OR;  Service: Vascular;  Laterality: Right;   HERNIA REPAIR     I & D EXTREMITY Right 03/31/2016   Procedure: IRRIGATION AND DEBRIDEMENT FOOT;  Surgeon: Chuck Hint, MD;  Location: [facility] OR;  Service: Vascular;  Laterality: Right;  IMPLANTABLE CARDIOVERTER DEFIBRILLATOR IMPLANT N/A 06/27/2013   Procedure: SUB Q ICD;  Surgeon: Duke Salvia, MD;  Location: [facility] CATH LAB;  Service: Cardiovascular;  Laterality: N/A;   INTRAOPERATIVE ARTERIOGRAM Right 03/31/2016   Procedure: INTRA OPERATIVE ARTERIOGRAM;  Surgeon: Chuck Hint, MD;  Location: [facility] OR;  Service: Vascular;  Laterality: Right;   IR GENERIC HISTORICAL Right 11/30/2015   IR THROMBECTOMY AV FISTULA W/THROMBOLYSIS/PTA INC/SHUNT/IMG RIGHT 11/30/2015 Irish Lack, MD [facility]-INTER[facility] RAD   IR GENERIC HISTORICAL  11/30/2015   IR US GUIDE VASC ACCESS RIGHT 11/30/2015 Irish Lack, MD [facility]   IR GENERIC HISTORICAL Right 12/15/2015   IR THROMBECTOMY AV FISTULA W/THROMBOLYSIS/PTA/STENT INC/SHUNT/IMG RT 12/15/2015 Oley Balm, MD [facility]   IR GENERIC HISTORICAL  12/15/2015   IR US GUIDE VASC ACCESS RIGHT 12/15/2015 Oley Balm, MD [facility]   IR GENERIC  HISTORICAL  12/28/2015   IR FLUORO GUIDE CV LINE RIGHT 12/28/2015 Jolaine Click, MD [facility]   IR GENERIC HISTORICAL  12/28/2015   IR US GUIDE VASC ACCESS RIGHT 12/28/2015 Jolaine Click, MD [facility]   LEFT A ND RIGHT HEART CATH  01/30/2013   DR Jones Broom   LEFT AND RIGHT HEART CATHETERIZATION WITH CORONARY ANGIOGRAM N/A 01/30/2013   Procedure: LEFT AND RIGHT HEART CATHETERIZATION WITH CORONARY ANGIOGRAM;  Surgeon: Dolores Patty, MD;  Location: [facility] CATH LAB;  Service: Cardiovascular;  Laterality: N/A;   LEFT HEART CATH AND CORONARY ANGIOGRAPHY N/A 04/12/2023   Procedure: LEFT HEART CATH AND CORONARY ANGIOGRAPHY;  Surgeon: Swaziland, Peter M, MD;  Location: [facility] INVASIVE CV LAB;  Service: Cardiovascular;  Laterality: N/A;   PERIPHERAL VASCULAR CATHETERIZATION Right 01/26/2015   Procedure: A/V Fistulagram;  Surgeon: Chuck Hint, MD;  Location: [facility] INVASIVE CV LAB;  Service: Cardiovascular;  Laterality: Right;   reapea urethral surgery for recurrent obstruction  2011   TOTAL KNEE ARTHROPLASTY Right 2007   VEIN HARVEST Right 03/31/2016   Procedure: RIGHT GREATER SAPHENOUS VEIN HARVEST;  Surgeon: Chuck Hint, MD;  Location: [facility] OR;  Service: Vascular;  Laterality: Right;   Social History:  reports that he quit smoking about 12 years ago. His smoking use included cigarettes. He started smoking about 44 years ago. He has a 64 pack-year smoking history. He has never used smokeless tobacco. He reports that he does not drink alcohol and does not use drugs.  Allergies  Allergen Reactions   Epoetin Alfa Other (See Comments)    Unknown    Ferumoxytol Other (See Comments)    Unknown    Morphine Sulfate Rash and Other (See Comments)    Itches all over, red spots    Family History  Problem Relation Age of Onset   Bladder Cancer Mother    Alcohol abuse Father    Melanoma Father    Stroke Maternal Grandmother    Heart Problems Maternal Grandmother        unknown   Diabetes Maternal  Grandmother    Heart disease Maternal Grandfather    Prostate cancer Maternal Grandfather     Prior to Admission medications   Medication Sig Start Date End Date Taking? Authorizing Provider  acetaminophen (TYLENOL) 325 MG tablet Take 1-2 tablets (325-650 mg total) by mouth every 4 (four) hours as needed for mild pain. 11/29/19   Love, Evlyn Kanner, PA-C  apixaban (ELIQUIS) 5 MG TABS tablet Take 1 tablet (5 mg total) by mouth 2 (two) times daily. 12/21/22   Burchette, Elberta Fortis, MD  aspirin EC 81 MG tablet  Take 1 tablet (81 mg total) by mouth daily. Swallow whole. 04/15/23   Hongalgi, Maximino Greenland, MD  atorvastatin (LIPITOR) 80 MG tablet TAKE 1 TABLET BY MOUTH AT BEDTIME Patient taking differently: Take 80 mg by mouth daily. 06/24/22   Burchette, Elberta Fortis, MD  carvedilol (COREG) 12.5 MG tablet Take 1 tablet (12.5 mg total) by mouth 2 (two) times daily. 01/25/23   Burchette, Elberta Fortis, MD  cinacalcet (SENSIPAR) 30 MG tablet Take 1 tablet (30 mg total) by mouth Every Tuesday,Thursday,and Saturday with dialysis. 11/30/19   Love, Evlyn Kanner, PA-C  clopidogrel (PLAVIX) 75 MG tablet Take 1 tablet (75 mg total) by mouth daily with breakfast. 04/16/23   Hongalgi, Maximino Greenland, MD  Continuous Glucose Receiver (FREESTYLE LIBRE 3 READER) DEVI Use to check blood glucose TID 08/11/22   Burchette, Elberta Fortis, MD  Continuous Glucose Sensor (FREESTYLE LIBRE 3 PLUS SENSOR) MISC Change sensor every 15 days. Use as directed to check glucose daily 12/21/22   Burchette, Elberta Fortis, MD  gabapentin (NEURONTIN) 300 MG capsule Take 1 capsule (300 mg total) by mouth at bedtime. 04/15/23   Hongalgi, Theadora Rama D, MD  glucose blood test strip Check 1 time daily. E11.9 One Touch Ultra Blue Test Strips 03/10/14   Burchette, Elberta Fortis, MD  HUMALOG KWIKPEN 200 UNIT/ML KwikPen INJECT A MAXIMUM OF 28 UNITS SUBCUTANEOUSLY TWICE DAILY WITH LUNCH AND SUPPER PER SLIDING SCALE. APPOINTMENT REQUIRED FOR FUTURE REFILLS 11/11/22   Burchette, Elberta Fortis, MD  Insulin Pen Needle (BD PEN  NEEDLE NANO U/F) 32G X 4 MM MISC USE 1 PEN NEEDLE SUBCUTANEOUSLY WITH INSULIN 4 TIMES DAILY 12/04/19   Burchette, Elberta Fortis, MD  LANTUS SOLOSTAR 100 UNIT/ML Solostar Pen INJECT 12 UNITS SUBCUTANEOUSLY AT BEDTIME Patient taking differently: Inject 14 Units into the skin at bedtime. 07/15/22   Burchette, Elberta Fortis, MD  midodrine (PROAMATINE) 10 MG tablet Take 10 mg by mouth 3 (three) times a week. EVERY TUESDAY, THURSDAY, AND SATURDAY 08/04/20   [provider]  multivitamin (RENA-VIT) TABS tablet Take 1 tablet by mouth once daily Patient taking differently: Take 1 tablet by mouth daily. 07/06/21   Burchette, Elberta Fortis, MD  nitroGLYCERIN (NITROSTAT) 0.4 MG SL tablet Place 1 tablet (0.4 mg total) under the tongue every 5 (five) minutes as needed for chest pain. 04/21/23   Burchette, Elberta Fortis, MD  Olopatadine HCl 0.2 % SOLN Place 1 drop into both eyes daily as needed (for allergies).     [provider]  sevelamer (RENAGEL) 800 MG tablet Take 1,600 mg by mouth 3 (three) times daily with meals.    [provider]  VENTOLIN HFA 108 (90 Base) MCG/ACT inhaler Inhale 1-2 puffs into the lungs every 6 (six) hours as needed for wheezing or shortness of breath. 03/20/23   Kristian Covey, MD    Physical Exam: Vitals:   04/27/23 0513 04/27/23 0600 04/27/23 0700 04/27/23 0729  BP: (!) 105/59 (!) 118/52 (!) 121/54 (!) 121/54  Pulse: 95 100 94 96  Resp: 20 15 14 20   Temp:    98 F (36.7 C)  TempSrc:    Oral  SpO2: 100% 100% 100% 100%  Weight:      Height:       Constitutional: Elderly male who appears to be in some discomfort Eyes: PERRL, lids and conjunctivae normal ENMT: Mucous membranes are moist. Posterior pharynx clear of any exudate or lesions.Normal dentition.  Neck: normal, supple, no masses, no thyromegaly Respiratory: clear to auscultation bilaterally, no  wheezing, no crackles. Normal respiratory effort. No accessory muscle use.  Cardiovascular: Regular rate and rhythm, no  murmurs / rubs / gallops.   Right chest hemodialysis catheter present. Abdomen: no tenderness, no masses palpated. No hepatosplenomegaly. Bowel sounds positive.  Musculoskeletal: no clubbing / cyanosis.  Bilateral BKA's. Skin: no rashes  Neurologic: CN 2-12 grossly intact. Sensation intact, DTR normal. Strength 5/5 in all 4.  Psychiatric: Normal judgment and insight. Alert and oriented x 3. Normal mood.   Data Reviewed:  EKG reveals sinus tachycardia 101 bpm with nonspecific IVCD.  Reviewed labs, imaging, and pertinent records as documented.  Assessment and Plan:  Acute blood loss anemia secondary to suspected upper GI bleed Patient presents with complaints of nausea, vomiting, and diarrhea over the last 3 days.  Hemoglobin dropped from 10.6 on 4/5 dropped down to 5.6 today.  Stool guaiacs were noted to be positive.  Patient has been typed and screened for possible need of blood products.  He was typed and screened and started on Protonix IV. - Admit to a progressive bed - Aspiration precautions with elevation head of bed - N.p.o. -On aspirin and Plavix - Continue Protonix IV - Continue to monitor H&H and transfuse blood products with goal hemoglobin of 8 g/dL. [facility] consulted, follow-up for any further recommendations  Leukocytosis Acute on chronic.  White blood cell count elevated at 21.  Possibly reactive to above. - Recheck CBC tomorrow morning  Hypotension Blood pressures have been soft currently maintained. -Continue midodrine  ESRD on HD Metabolic acidosis with elevated anion gap Patient normally dialyzes on a TTS schedule.  Last hemodialysis was 2 days ago. Labs noted potassium 3.5, CO2 19, BUN 95, creatinine 8.2, and anion gap 20.  Metabolic acidosis with elevated anion gap likely secondary to renal disease. -Continue current medication regimen - Nephrology consulted for need of hemodialysis  Elevated troponin CAD Hyperlipidemia Patient reports having some  chest discomfort.  High-sensitivity troponins 345 ->387.  He had just underwent staged PCI. 4/2: S/p cardiac cath with stenting of LAD and first diagonal. 4/4: S/p successful PCI to the RCA Placed on dual antiplatelet therapy. - Resume dual antiplatelet therapy once able  Paroxysmal atrial fibrillation on chronic anticoagulation Patient appears to be in sinus rhythm at this time.  Last dose of Eliquis yesterday evening.CHA2DS2-VASc score equal to at least 4. - Holding Eliquis.  Controlled diabetes mellitus type 2, with long-term use of insulin Admission glucose noted to be 197.  Last available hemoglobin A1c was 6.6. - Hypoglycemic protocols - Semglee 16 units nightly - CBGs before every meal with moderate SSI - Adjust insulin regimen as needed  Hyponatremia Acute on chronic.  Sodium noted to be 130.    - Continue to monitor  DVT prophylaxis: SCDs Advance Care Planning:   Code Status: Full Code   Consults: [facility] and nephrology  Family Communication: None  Severity of Illness: The appropriate patient status for this patient is INPATIENT. Inpatient status is judged to be reasonable and necessary in order to provide the required intensity of service to ensure the patient's safety. The patient's presenting symptoms, physical exam findings, and initial radiographic and laboratory data in the context of their chronic comorbidities is felt to place them at high risk for further clinical deterioration. Furthermore, it is not anticipated that the patient will be medically stable for discharge from the hospital within 2 midnights of admission.   * I certify that at the point of admission it is my clinical  judgment that the patient will require inpatient hospital care spanning beyond 2 midnights from the point of admission due to high intensity of service, high risk for further deterioration and high frequency of surveillance required.*  Author: Lena Qualia, MD 04/27/2023 8:05 AM  For  on call review [email or web address].

## 2023-04-27 NOTE — ED Notes (Addendum)
 Rec'ed patient. No acute distress noted no complaints at this time patient assisted to [facility] for BM labs and stool sent to lab. Meds admin as order patient resting. Blankets provided safety and comfort maintained siderails up x2 call bed within reach.

## 2023-04-27 NOTE — ED Notes (Signed)
 Pt called out, reporting his "lungs are burning" and request oxygen. He points to epigastric area for his "burning" discomfort. Saturations 100% resp 14-16, no increase in WOB.

## 2023-04-27 NOTE — ED Notes (Signed)
 This RN and patient signed e-consent for blood administration.

## 2023-04-27 NOTE — ED Notes (Signed)
 2 RN's attempted, 2 times each with unsuccessful IV start. IV team consulted

## 2023-04-27 NOTE — H&P (View-Only) (Signed)
 Consultation Note   Referring Provider:  [facility] PCP: Marquetta Sit, MD Primary Gastroenterologist::   Lorella Roles, MD       Reason for Consultation: GI bleed with anemia DOA: 04/27/2023         Hospital Day: 1   ASSESSMENT    63 y.o. year old male with a medical history including but not limited to CAD with recent cardiac stenting, ESRD on HD TTS, DM2, paroxysmal atrial fibrillation, chronic systolic heart failure.   Upper GI bleed with hypotension Melena Dark emesis Acute on chronic anemia On anticoagulant and antiplatelet tx.  Baseline hemoglobin around 11, down to 5.6 now BP soft, on midodrine at home  Leukocytosis, acute on chronic . Reactive? WBC 21K, afebrile  Recent NSTEMI status post cath with stent placement Marked elevation in troponin Possibly residual.  His troponins were 16,000 during hospitalization earlier this month.  Not having chest pain.  Recently started on Plavix and aspirin  Paroxysmal atrial fibrillation Takes Eliquis  ESRD on HD  See PMH for additional history  PLAN:   --Continue IV pantoprazole 40 mg twice daily -- N.p.o. for now.  If no active bleeding throughout the day then will allow clears.  -- Monitor H&H, 1 of 2 units RBCs currently transfusing -- Patient will need a diagnostic EGD (once hemoglobin improves).  This could not be done today as he had to biscuits around 8 AM.  He is at increased risk for procedures given recent NSTEMI and current use of antiplatelet/anticoagulants. The risks and benefits of EGD with possible biopsies were discussed with the patient who agrees to proceed.  Most likely will be done tomorrow.   HPI   Patient presented to the ED this a.m. with complaints of nausea ,vomiting and diarrhea since Monday.  He describes dark emesis and very dark loose stool since Monday.  No emesis today but reports he did have a dark stool this a.m.  He has not had any  abdominal pain.  No history of PUD or prior GI bleed.  He is on Plavix, Eliquis and aspirin.    ED evaluation notable for: Mild tachycardia, soft blood pressures . Troponin 345, WBC 21K hemoglobin 5.6, FOBT positive, bicarb 19, anion gap 20.  BUN 95 /creatinine 8.2  Previous GI Studies   July 2017 screening colonoscopy One 6 mm polyp in the rectum was removed.  One 4 mm polyp in the rectum was removed.  Internal hemorrhoids.  Exam otherwise normal  Labs and Imaging:  EKG EKG Interpretation Date/Time:                  Thursday April 27 2023 03:46:59 EDT Ventricular Rate:         101 PR Interval:                 154 QRS Duration:             141 QT Interval:                 395 QTC Calculation:512 R Axis:                         -41   Text Interpretation:Sinus  tachycardia Nonspecific IVCD with LAD Abnormal T, consider ischemia, lateral leads No significant change since last tracing Confirmed by Gilda Crease [phone]) on 04/27/2023 3:54:31 AM   Recent Labs    04/27/23 0404  WBC 21.0*  HGB 5.6*  HCT 17.4*  MCV 93.0  PLT 230   No results for input(s): "FOLATE", "VITAMINB12", "FERRITIN", "TIBC", "IRONPCTSAT" in the last 72 hours. Recent Labs    04/27/23 0404  NA 130*  K 3.5  CL 91*  CO2 19*  GLUCOSE 197*  BUN 95*  CREATININE 8.20*  CALCIUM 8.3*   Recent Labs    04/27/23 0404  PROT 5.9*  ALBUMIN 2.6*  AST 28  ALT 22  ALKPHOS 62  BILITOT 0.4   No results for input(s): "INR" in the last 72 hours. No results for input(s): "AFPTUMOR" in the last 72 hours.  CARDIAC CATHETERIZATION Images from the original result were not included.    LESION #1: Prox RCA to Mid RCA lesion is 80% stenosed.   Scoring balloon angioplasty was performed using a BALLN SCOREFLEX  3.50X15. Followed by shockwave lithotripsy with a 3.5 mm 12 mm shockwave  balloon   A drug-eluting stent was successfully placed using a SYNERGY XD 3.50X28  => postdilated in the majority of stent to  3.8-3.9 mm. Post intervention,  there is a 5% residual stenosis in the tightest segment, but the remainder  of the stent is 0% stenosed.  TIMI-3 flow maintained   --------------------------------------------   LESION #2 Ost RCA lesion is 80% stenosed.   Scoring balloon angioplasty was performed using a BALLN SCOREFLEX  3.50X15.  Followed by shockwave lithotripsy with a 3.5 mm 12 mm shockwave  balloon   A drug-eluting stent was successfully placed using a SYNERGY XD 3.50X16  postdilated from 4.2 to 4.0 mm.  Post intervention, there is a 0% residual  stenosis.  TIMI-3 flow maintained  Diagnostic  Dominance: Right       Intervention  Successful 2 site PCI of the RCA using score flex angioplasty followed by  shockwave lithotripsy for lesion modification: Mid RCA 80% reduced to 0%  with exception of focal 5% (Synergy XD 3.5 mm x 28 mm postdilated to 3.8  mm); ostial RCA 80% reduced to 0% (Synergy XD 3.5 mm 16mm postdilated in  tapered fashion from 4.2 to 3.9 mm)   RECOMMENDATIONS   In the absence of any other complications or medical issues, we expect  the patient to be ready for discharge from an interventional cardiology  perspective on 04/15/2023.   Continue to titrate GDMT   Recommend dual antiplatelet therapy with Aspirin 81mg  daily and  Clopidogrel 75mg  daily long-term (beyond 12 months) because of Extensive  PCI to both the LAD and RCA.   Would continue DAPT for 1 year and then after 1 year would continue  long-term clopidogrel    Past Medical History:  Diagnosis Date   AICD (automatic cardioverter/defibrillator) present    [employer]   Allergic rhinitis    Anemia    Arthritis    Chronic systolic heart failure (HCC)    a. ECHO (12/2012) EF 25-30%, HK entireanteroseptal myocardium //  b.  EF 25%, diffuse HK, grade 1 diastolic dysfunction, MAC, mild LAE, normal RVSF, trivial pericardial effusion   COPD (chronic obstructive pulmonary disease) (HCC)    Diabetes mellitus  type II    Diabetic nephropathy (HCC)    Diabetic neuropathy (HCC)    ESRD on hemodialysis (HCC)    started HD June  2017, goes to [facility] HD unit, Dr Lieutenant Reese   History of cardiac catheterization    a.Myoview 1/15:  There is significant left ventricular dysfunction. There may be slight scar at the apex. There is no significant ischemia. LV Ejection Fraction: 27%  //  b. RHC/LHC (1/15) with mean RA 6, PA 47/22 mean 33, mean PCWP 20, PVR 2.5 WU, CI 2.5; 80% dLAD stenosis, 70% diffuse large D.     History of kidney stones    Hyperlipidemia    Hypertension    Kidney stones    NICM (nonischemic cardiomyopathy) (HCC)    Primarily nonischemic.  Echo (12/14) with EF 25-30%.  Echo (3/15) with EF 25%, mild to moderately dilated LV, normal RV size and systolic function.     Osteomyelitis (HCC)    left fifth ray   Pneumonia    Urethral stricture    Wears glasses     Past Surgical History:  Procedure Laterality Date   ABDOMINAL AORTOGRAM W/LOWER EXTREMITY N/A 03/30/2016   Procedure: Abdominal Aortogram w/Lower Extremity;  Surgeon: Dannis Dy, MD;  Location: [facility] INVASIVE CV LAB;  Service: Cardiovascular;  Laterality: N/A;   AMPUTATION Right 04/26/2016   Procedure: Right Below Knee Amputation;  Surgeon: Timothy Ford, MD;  Location: [facility] OR;  Service: Orthopedics;  Laterality: Right;   AMPUTATION Left 08/21/2019   Procedure: LEFT FOOT 5TH RAY AMPUTATION;  Surgeon: Timothy Ford, MD;  Location: [facility] OR;  Service: Orthopedics;  Laterality: Left;   AMPUTATION Left 11/13/2019   Procedure: LEFT BELOW KNEE AMPUTATION;  Surgeon: Timothy Ford, MD;  Location: [facility] OR;  Service: Orthopedics;  Laterality: Left;   AV FISTULA PLACEMENT Right 09/08/2015   Procedure: INSERTION OF 4-40mm x 45cm  ARTERIOVENOUS (AV) GORE-TEX GRAFT RIGHT UPPER  ARM;  Surgeon: Dannis Dy, MD;  Location: [facility] OR;  Service: Vascular;  Laterality: Right;   AV FISTULA PLACEMENT Left 01/14/2016   Procedure: CREATION OF LEFT  UPPER ARM ARTERIOVENOUS FISTULA;  Surgeon: Dannis Dy, MD;  Location: [facility] OR;  Service: Vascular;  Laterality: Left;   BASCILIC VEIN TRANSPOSITION Right 08/22/2014   Procedure: RIGHT UPPER ARM BASCILIC VEIN TRANSPOSITION;  Surgeon: Dannis Dy, MD;  Location: [facility] OR;  Service: Vascular;  Laterality: Right;   BELOW KNEE LEG AMPUTATION Right 04/26/2016   CARDIAC CATHETERIZATION     CARDIAC DEFIBRILLATOR PLACEMENT  06/27/2013   Sub Q       BY DR Rodolfo Clan   CATARACT EXTRACTION W/PHACO Right 08/06/2018   Procedure: CATARACT EXTRACTION PHACO AND INTRAOCULAR LENS PLACEMENT (IOC);  Surgeon: Tarri Farm, MD;  Location: [facility] ORS;  Service: Ophthalmology;  Laterality: Right;  CDE: 4.06   CATARACT EXTRACTION W/PHACO Left 08/20/2018   Procedure: CATARACT EXTRACTION PHACO AND INTRAOCULAR LENS PLACEMENT (IOC);  Surgeon: Tarri Farm, MD;  Location: [facility] ORS;  Service: Ophthalmology;  Laterality: Left;  CDE: 6.76   COLONOSCOPY WITH PROPOFOL N/A 07/22/2015   Procedure: COLONOSCOPY WITH PROPOFOL;  Surgeon: Albertina Hugger, MD;  Location: [facility] ENDOSCOPY;  Service: Gastroenterology;  Laterality: N/A;   CORONARY LITHOTRIPSY N/A 04/14/2023   Procedure: CORONARY LITHOTRIPSY;  Surgeon: Arleen Lacer, MD;  Location: [facility] INVASIVE CV LAB;  Service: Cardiovascular;  Laterality: N/A;  RCA   CORONARY STENT INTERVENTION N/A 04/12/2023   Procedure: CORONARY STENT INTERVENTION;  Surgeon: Swaziland, Peter M, MD;  Location: [facility] INVASIVE CV LAB;  Service: Cardiovascular;  Laterality: N/A;   CORONARY STENT INTERVENTION N/A 04/14/2023   Procedure: CORONARY STENT INTERVENTION;  Surgeon: Marykay Lex, MD;  Location: [facility] INVASIVE CV LAB;  Service: Cardiovascular;  Laterality: N/A;   FEMORAL-POPLITEAL BYPASS GRAFT Right 03/31/2016   Procedure: BYPASS GRAFT FEMORAL-POPLITEAL ARTERY USING RIGHT GREATER SAPHENOUS NONREVERSED VEIN;  Surgeon: Chuck Hint, MD;  Location: [facility] OR;  Service: Vascular;  Laterality: Right;   HERNIA  REPAIR     I & D EXTREMITY Right 03/31/2016   Procedure: IRRIGATION AND DEBRIDEMENT FOOT;  Surgeon: Chuck Hint, MD;  Location: [facility] OR;  Service: Vascular;  Laterality: Right;   IMPLANTABLE CARDIOVERTER DEFIBRILLATOR IMPLANT N/A 06/27/2013   Procedure: SUB Q ICD;  Surgeon: Duke Salvia, MD;  Location: [facility] CATH LAB;  Service: Cardiovascular;  Laterality: N/A;   INTRAOPERATIVE ARTERIOGRAM Right 03/31/2016   Procedure: INTRA OPERATIVE ARTERIOGRAM;  Surgeon: Chuck Hint, MD;  Location: [facility] OR;  Service: Vascular;  Laterality: Right;   IR GENERIC HISTORICAL Right 11/30/2015   IR THROMBECTOMY AV FISTULA W/THROMBOLYSIS/PTA INC/SHUNT/IMG RIGHT 11/30/2015 Irish Lack, MD [facility]   IR GENERIC HISTORICAL  11/30/2015   IR US GUIDE VASC ACCESS RIGHT 11/30/2015 Irish Lack, MD [facility]   IR GENERIC HISTORICAL Right 12/15/2015   IR THROMBECTOMY AV FISTULA W/THROMBOLYSIS/PTA/STENT INC/SHUNT/IMG RT 12/15/2015 Oley Balm, MD [facility]   IR GENERIC HISTORICAL  12/15/2015   IR US GUIDE VASC ACCESS RIGHT 12/15/2015 Oley Balm, MD [facility]   IR GENERIC HISTORICAL  12/28/2015   IR FLUORO GUIDE CV LINE RIGHT 12/28/2015 Jolaine Click, MD [facility]   IR GENERIC HISTORICAL  12/28/2015   IR US GUIDE VASC ACCESS RIGHT 12/28/2015 Jolaine Click, MD [facility]   LEFT A ND RIGHT HEART CATH  01/30/2013   DR Jones Broom   LEFT AND RIGHT HEART CATHETERIZATION WITH CORONARY ANGIOGRAM N/A 01/30/2013   Procedure: LEFT AND RIGHT HEART CATHETERIZATION WITH CORONARY ANGIOGRAM;  Surgeon: Dolores Patty, MD;  Location: [facility] CATH LAB;  Service: Cardiovascular;  Laterality: N/A;   LEFT HEART CATH AND CORONARY ANGIOGRAPHY N/A 04/12/2023   Procedure: LEFT HEART CATH AND CORONARY ANGIOGRAPHY;  Surgeon: Swaziland, Peter M, MD;  Location: [facility] INVASIVE CV LAB;  Service: Cardiovascular;  Laterality: N/A;   PERIPHERAL VASCULAR CATHETERIZATION Right 01/26/2015   Procedure: A/V Fistulagram;  Surgeon:  Chuck Hint, MD;  Location: [facility] INVASIVE CV LAB;  Service: Cardiovascular;  Laterality: Right;   reapea urethral surgery for recurrent obstruction  2011   TOTAL KNEE ARTHROPLASTY Right 2007   VEIN HARVEST Right 03/31/2016   Procedure: RIGHT GREATER SAPHENOUS VEIN HARVEST;  Surgeon: Chuck Hint, MD;  Location: [facility] OR;  Service: Vascular;  Laterality: Right;    Family History  Problem Relation Age of Onset   Bladder Cancer Mother    Alcohol abuse Father    Melanoma Father    Stroke Maternal Grandmother    Heart Problems Maternal Grandmother        unknown   Diabetes Maternal Grandmother    Heart disease Maternal Grandfather    Prostate cancer Maternal Grandfather     Prior to Admission medications   Medication Sig Start Date End Date Taking? Authorizing Provider  acetaminophen (TYLENOL) 325 MG tablet Take 1-2 tablets (325-650 mg total) by mouth every 4 (four) hours as needed for mild pain. 11/29/19   Love, Evlyn Kanner, PA-C  apixaban (ELIQUIS) 5 MG TABS tablet Take 1 tablet (5 mg total) by mouth 2 (two) times daily. 12/21/22   Burchette, Elberta Fortis, MD  aspirin EC 81 MG tablet Take 1 tablet (81 mg  total) by mouth daily. Swallow whole. 04/15/23   Hongalgi, Anand D, MD  atorvastatin (LIPITOR) 80 MG tablet TAKE 1 TABLET BY MOUTH AT BEDTIME Patient taking differently: Take 80 mg by mouth daily. 06/24/22   Burchette, Marijean Shouts, MD  carvedilol (COREG) 12.5 MG tablet Take 1 tablet (12.5 mg total) by mouth 2 (two) times daily. 01/25/23   Burchette, Marijean Shouts, MD  cinacalcet (SENSIPAR) 30 MG tablet Take 1 tablet (30 mg total) by mouth Every Tuesday,Thursday,and Saturday with dialysis. 11/30/19   Love, Renay Carota, PA-C  clopidogrel (PLAVIX) 75 MG tablet Take 1 tablet (75 mg total) by mouth daily with breakfast. 04/16/23   Hongalgi, Thomasene Flemings, MD  Continuous Glucose Receiver (FREESTYLE LIBRE 3 READER) DEVI Use to check blood glucose TID 08/11/22   Burchette, Marijean Shouts, MD  Continuous Glucose Sensor  (FREESTYLE LIBRE 3 PLUS SENSOR) MISC Change sensor every 15 days. Use as directed to check glucose daily 12/21/22   Burchette, Marijean Shouts, MD  gabapentin (NEURONTIN) 300 MG capsule Take 1 capsule (300 mg total) by mouth at bedtime. 04/15/23   Hongalgi, Anand D, MD  glucose blood test strip Check 1 time daily. E11.9 One Touch Ultra Blue Test Strips 03/10/14   Burchette, Marijean Shouts, MD  HUMALOG KWIKPEN 200 UNIT/ML KwikPen INJECT A MAXIMUM OF 28 UNITS SUBCUTANEOUSLY TWICE DAILY WITH LUNCH AND SUPPER PER SLIDING SCALE. APPOINTMENT REQUIRED FOR FUTURE REFILLS 11/11/22   Burchette, Marijean Shouts, MD  Insulin Pen Needle (BD PEN NEEDLE NANO U/F) 32G X 4 MM MISC USE 1 PEN NEEDLE SUBCUTANEOUSLY WITH INSULIN 4 TIMES DAILY 12/04/19   Burchette, Marijean Shouts, MD  LANTUS SOLOSTAR 100 UNIT/ML Solostar Pen INJECT 12 UNITS SUBCUTANEOUSLY AT BEDTIME Patient taking differently: Inject 14 Units into the skin at bedtime. 07/15/22   Burchette, Marijean Shouts, MD  midodrine (PROAMATINE) 10 MG tablet Take 10 mg by mouth 3 (three) times a week. EVERY TUESDAY, THURSDAY, AND SATURDAY 08/04/20   [provider]  multivitamin (RENA-VIT) TABS tablet Take 1 tablet by mouth once daily Patient taking differently: Take 1 tablet by mouth daily. 07/06/21   Burchette, Marijean Shouts, MD  nitroGLYCERIN (NITROSTAT) 0.4 MG SL tablet Place 1 tablet (0.4 mg total) under the tongue every 5 (five) minutes as needed for chest pain. 04/21/23   Burchette, Marijean Shouts, MD  Olopatadine HCl 0.2 % SOLN Place 1 drop into both eyes daily as needed (for allergies).     [provider]  sevelamer (RENAGEL) 800 MG tablet Take 1,600 mg by mouth 3 (three) times daily with meals.    [provider]  VENTOLIN HFA 108 (90 Base) MCG/ACT inhaler Inhale 1-2 puffs into the lungs every 6 (six) hours as needed for wheezing or shortness of breath. 03/20/23   Burchette, Marijean Shouts, MD    Current Facility-Administered Medications  Medication Dose Route Frequency Provider Last Rate Last  Admin   0.9 %  sodium chloride infusion (Manually program via Guardrails IV Fluids)   Intravenous Once Smith, Rondell A, MD   Held at 04/27/23 0845   acetaminophen (TYLENOL) tablet 650 mg  650 mg Oral Q6H PRN Smith, Rondell A, MD       Or   acetaminophen (TYLENOL) suppository 650 mg  650 mg Rectal Q6H PRN Smith, Rondell A, MD       albuterol (PROVENTIL) (2.5 MG/3ML) 0.083% nebulizer solution 2.5 mg  2.5 mg Nebulization Q6H PRN Smith, Rondell A, MD       atorvastatin (LIPITOR) tablet 80 mg  80 mg Oral QPM Smith, Rondell A, MD       Chlorhexidine Gluconate Cloth 2 % PADS 6 each  6 each Topical Daily Pollina, Canary Brim, MD       gabapentin (NEURONTIN) capsule 300 mg  300 mg Oral QHS Clydie Braun, MD       [START ON 04/28/2023] midodrine (PROAMATINE) tablet 10 mg  10 mg Oral Once per day on Monday Wednesday Friday Clydie Braun, MD       multivitamin (RENA-VIT) tablet 1 tablet  1 tablet Oral Daily Smith, Rondell A, MD       ondansetron (ZOFRAN) tablet 4 mg  4 mg Oral Q6H PRN Clydie Braun, MD       Or   ondansetron (ZOFRAN) injection 4 mg  4 mg Intravenous Q6H PRN Smith, Rondell A, MD       pantoprazole (PROTONIX) injection 40 mg  40 mg Intravenous Q6H Pollina, Canary Brim, MD       Followed by   Melene Muller ON 04/30/2023] pantoprazole (PROTONIX) injection 40 mg  40 mg Intravenous Q12H Pollina, Canary Brim, MD       sodium chloride flush (NS) 0.9 % injection 10-40 mL  10-40 mL Intracatheter PRN Pollina, Canary Brim, MD       sodium chloride flush (NS) 0.9 % injection 3 mL  3 mL Intravenous Q12H Clydie Braun, MD       Current Outpatient Medications  Medication Sig Dispense Refill   acetaminophen (TYLENOL) 325 MG tablet Take 1-2 tablets (325-650 mg total) by mouth every 4 (four) hours as needed for mild pain.     apixaban (ELIQUIS) 5 MG TABS tablet Take 1 tablet (5 mg total) by mouth 2 (two) times daily. 180 tablet 3   aspirin EC 81 MG tablet Take 1 tablet (81 mg total) by mouth  daily. Swallow whole. 30 tablet 2   atorvastatin (LIPITOR) 80 MG tablet TAKE 1 TABLET BY MOUTH AT BEDTIME (Patient taking differently: Take 80 mg by mouth daily.) 90 tablet 3   carvedilol (COREG) 12.5 MG tablet Take 1 tablet (12.5 mg total) by mouth 2 (two) times daily. 180 tablet 3   cinacalcet (SENSIPAR) 30 MG tablet Take 1 tablet (30 mg total) by mouth Every Tuesday,Thursday,and Saturday with dialysis. 60 tablet    clopidogrel (PLAVIX) 75 MG tablet Take 1 tablet (75 mg total) by mouth daily with breakfast. 30 tablet 2   Continuous Glucose Receiver (FREESTYLE LIBRE 3 READER) DEVI Use to check blood glucose TID 1 each 0   Continuous Glucose Sensor (FREESTYLE LIBRE 3 PLUS SENSOR) MISC Change sensor every 15 days. Use as directed to check glucose daily 6 each 3   gabapentin (NEURONTIN) 300 MG capsule Take 1 capsule (300 mg total) by mouth at bedtime. 30 capsule 2   glucose blood test strip Check 1 time daily. E11.9 One Touch Ultra Blue Test Strips 100 each 3   HUMALOG KWIKPEN 200 UNIT/ML KwikPen INJECT A MAXIMUM OF 28 UNITS SUBCUTANEOUSLY TWICE DAILY WITH LUNCH AND SUPPER PER SLIDING SCALE. APPOINTMENT REQUIRED FOR FUTURE REFILLS 6 mL 5   Insulin Pen Needle (BD PEN NEEDLE NANO U/F) 32G X 4 MM MISC USE 1 PEN NEEDLE SUBCUTANEOUSLY WITH INSULIN 4 TIMES DAILY 400 each 0   LANTUS SOLOSTAR 100 UNIT/ML Solostar Pen INJECT 12 UNITS SUBCUTANEOUSLY AT BEDTIME (Patient taking differently: Inject 14 Units into the skin at bedtime.) 15 mL 0   midodrine (PROAMATINE) 10 MG tablet Take 10 mg by  mouth 3 (three) times a week. EVERY TUESDAY, THURSDAY, AND SATURDAY     multivitamin (RENA-VIT) TABS tablet Take 1 tablet by mouth once daily (Patient taking differently: Take 1 tablet by mouth daily.) 10 tablet 3   nitroGLYCERIN (NITROSTAT) 0.4 MG SL tablet Place 1 tablet (0.4 mg total) under the tongue every 5 (five) minutes as needed for chest pain. 30 tablet 1   Olopatadine HCl 0.2 % SOLN Place 1 drop into both eyes daily  as needed (for allergies).      sevelamer (RENAGEL) 800 MG tablet Take 1,600 mg by mouth 3 (three) times daily with meals.     VENTOLIN HFA 108 (90 Base) MCG/ACT inhaler Inhale 1-2 puffs into the lungs every 6 (six) hours as needed for wheezing or shortness of breath. 18 g 1    Allergies as of 04/27/2023 - Review Complete 04/27/2023  Allergen Reaction Noted   Epoetin alfa Other (See Comments) 07/09/2019   Ferumoxytol Other (See Comments) 07/09/2019   Morphine sulfate Rash and Other (See Comments) 05/07/2008    Social History   Socioeconomic History   Marital status: Married    Spouse name: Not on file   Number of children: 0   Years of education: Not on file   Highest education level: Not on file  Occupational History   Not on file  Tobacco Use   Smoking status: Former    Current packs/day: 0.00    Average packs/day: 2.0 packs/day for 32.0 years (64.0 ttl pk-yrs)    Types: Cigarettes    Start date: 05/11/1978    Quit date: 05/11/2010    Years since quitting: 12.9   Smokeless tobacco: Never  Vaping Use   Vaping status: Never Used  Substance and Sexual Activity   Alcohol use: No   Drug use: No   Sexual activity: Yes  Other Topics Concern   Not on file  Social History Narrative   Works at [employer] as a Therapist, music      Right handed   Wears glasses    Drinks 10 oz coffee daily   Drinks 2 sodas per day      Pt disabled    Lives with wife    Social Drivers of Corporate investment banker Strain: Low Risk  (02/03/2023)   Overall Financial Resource Strain (CARDIA)    Difficulty of Paying Living Expenses: Not hard at all  Food Insecurity: No Food Insecurity (04/17/2023)   Hunger Vital Sign    Worried About Running Out of Food in the Last Year: Never true    Ran Out of Food in the Last Year: Never true  Transportation Needs: No Transportation Needs (04/17/2023)   PRAPARE - Administrator, Civil Service (Medical): No    Lack of Transportation (Non-Medical): No   Physical Activity: Inactive (02/03/2023)   Exercise Vital Sign    Days of Exercise per Week: 0 days    Minutes of Exercise per Session: 0 min  Stress: No Stress Concern Present (02/03/2023)   [employer] of Occupational Health - Occupational Stress Questionnaire    Feeling of Stress : Not at all  Social Connections: Socially Integrated (03/07/2023)   Social Connection and Isolation Panel [NHANES]    Frequency of Communication with Friends and Family: More than three times a week    Frequency of Social Gatherings with Friends and Family: More than three times a week    Attends Religious Services: More than 4 times per year  Active Member of Clubs or Organizations: Yes    Attends Banker Meetings: More than 4 times per year    Marital Status: Married  Catering manager Violence: Not At Risk (04/17/2023)   Humiliation, Afraid, Rape, and Kick questionnaire    Fear of Current or Ex-Partner: No    Emotionally Abused: No    Physically Abused: No    Sexually Abused: No     Code Status   Code Status: Full Code  Review of Systems: All systems reviewed and negative except where noted in HPI.  Physical Exam: Vital signs in last 24 hours: Temp:  [97.5 F (36.4 C)-98.3 F (36.8 C)] 98.3 F (36.8 C) (04/17 0945) Pulse Rate:  [93-107] 96 (04/17 1011) Resp:  [9-25] 20 (04/17 1011) BP: (84-121)/(43-91) 84/47 (04/17 1011) SpO2:  [96 %-100 %] 100 % (04/17 1011) Weight:  [89.4 kg] 89.4 kg (04/17 0347)    General:  Pleasant male in NAD Psych:  Cooperative. Normal mood and affect Eyes: Pupils equal Ears:  Normal auditory acuity Nose: No deformity, discharge or lesions Neck:  Supple, no masses felt Lungs:  Clear to auscultation.  Heart:  Regular rate.  Abdomen:  Soft, nondistended, nontender, active bowel sounds, no masses felt Rectal :  Deferred Msk: Symmetrical without gross deformities.  Neurologic:  Alert, oriented, grossly normal neurologically Extremities :  Bilateral BKA  Skin:  Intact without significant lesions.    Intake/Output from previous day: No intake/output data recorded. Intake/Output this shift:  No intake/output data recorded.   Mai Schwalbe, NP-C   04/27/2023, 10:48 AM

## 2023-04-27 NOTE — ED Triage Notes (Signed)
 Pt BIB [facility] d/t N/V/D for 3 days.  Pt had recent cardiac stents placed here so wanted to come here instead of [place].   Pt is bilat amputee and is a dialysis pt.

## 2023-04-27 NOTE — ED Notes (Signed)
 IV team at bedside

## 2023-04-27 NOTE — Consult Note (Signed)
 Consultation Note   Referring Provider:  [facility] PCP: Marquetta Sit, MD Primary Gastroenterologist::   Lorella Roles, MD       Reason for Consultation: GI bleed with anemia DOA: 04/27/2023         Hospital Day: 1   ASSESSMENT    63 y.o. year old male with a medical history including but not limited to CAD with recent cardiac stenting, ESRD on HD TTS, DM2, paroxysmal atrial fibrillation, chronic systolic heart failure.   Upper GI bleed with hypotension Melena Dark emesis Acute on chronic anemia On anticoagulant and antiplatelet tx.  Baseline hemoglobin around 11, down to 5.6 now BP soft, on midodrine at home  Leukocytosis, acute on chronic . Reactive? WBC 21K, afebrile  Recent NSTEMI status post cath with stent placement Marked elevation in troponin Possibly residual.  His troponins were 16,000 during hospitalization earlier this month.  Not having chest pain.  Recently started on Plavix and aspirin  Paroxysmal atrial fibrillation Takes Eliquis  ESRD on HD  See PMH for additional history  PLAN:   --Continue IV pantoprazole 40 mg twice daily -- N.p.o. for now.  If no active bleeding throughout the day then will allow clears.  -- Monitor H&H, 1 of 2 units RBCs currently transfusing -- Patient will need a diagnostic EGD (once hemoglobin improves).  This could not be done today as he had to biscuits around 8 AM.  He is at increased risk for procedures given recent NSTEMI and current use of antiplatelet/anticoagulants. The risks and benefits of EGD with possible biopsies were discussed with the patient who agrees to proceed.  Most likely will be done tomorrow.   HPI   Patient presented to the ED this a.m. with complaints of nausea ,vomiting and diarrhea since Monday.  He describes dark emesis and very dark loose stool since Monday.  No emesis today but reports he did have a dark stool this a.m.  He has not had any  abdominal pain.  No history of PUD or prior GI bleed.  He is on Plavix, Eliquis and aspirin.    ED evaluation notable for: Mild tachycardia, soft blood pressures . Troponin 345, WBC 21K hemoglobin 5.6, FOBT positive, bicarb 19, anion gap 20.  BUN 95 /creatinine 8.2  Previous GI Studies   July 2017 screening colonoscopy One 6 mm polyp in the rectum was removed.  One 4 mm polyp in the rectum was removed.  Internal hemorrhoids.  Exam otherwise normal  Labs and Imaging:  EKG EKG Interpretation Date/Time:                  Thursday April 27 2023 03:46:59 EDT Ventricular Rate:         101 PR Interval:                 154 QRS Duration:             141 QT Interval:                 395 QTC Calculation:512 R Axis:                         -41   Text Interpretation:Sinus  tachycardia Nonspecific IVCD with LAD Abnormal T, consider ischemia, lateral leads No significant change since last tracing Confirmed by Gilda Crease [phone]) on 04/27/2023 3:54:31 AM   Recent Labs    04/27/23 0404  WBC 21.0*  HGB 5.6*  HCT 17.4*  MCV 93.0  PLT 230   No results for input(s): "FOLATE", "VITAMINB12", "FERRITIN", "TIBC", "IRONPCTSAT" in the last 72 hours. Recent Labs    04/27/23 0404  NA 130*  K 3.5  CL 91*  CO2 19*  GLUCOSE 197*  BUN 95*  CREATININE 8.20*  CALCIUM 8.3*   Recent Labs    04/27/23 0404  PROT 5.9*  ALBUMIN 2.6*  AST 28  ALT 22  ALKPHOS 62  BILITOT 0.4   No results for input(s): "INR" in the last 72 hours. No results for input(s): "AFPTUMOR" in the last 72 hours.  CARDIAC CATHETERIZATION Images from the original result were not included.    LESION #1: Prox RCA to Mid RCA lesion is 80% stenosed.   Scoring balloon angioplasty was performed using a BALLN SCOREFLEX  3.50X15. Followed by shockwave lithotripsy with a 3.5 mm 12 mm shockwave  balloon   A drug-eluting stent was successfully placed using a SYNERGY XD 3.50X28  => postdilated in the majority of stent to  3.8-3.9 mm. Post intervention,  there is a 5% residual stenosis in the tightest segment, but the remainder  of the stent is 0% stenosed.  TIMI-3 flow maintained   --------------------------------------------   LESION #2 Ost RCA lesion is 80% stenosed.   Scoring balloon angioplasty was performed using a BALLN SCOREFLEX  3.50X15.  Followed by shockwave lithotripsy with a 3.5 mm 12 mm shockwave  balloon   A drug-eluting stent was successfully placed using a SYNERGY XD 3.50X16  postdilated from 4.2 to 4.0 mm.  Post intervention, there is a 0% residual  stenosis.  TIMI-3 flow maintained  Diagnostic  Dominance: Right       Intervention  Successful 2 site PCI of the RCA using score flex angioplasty followed by  shockwave lithotripsy for lesion modification: Mid RCA 80% reduced to 0%  with exception of focal 5% (Synergy XD 3.5 mm x 28 mm postdilated to 3.8  mm); ostial RCA 80% reduced to 0% (Synergy XD 3.5 mm 16mm postdilated in  tapered fashion from 4.2 to 3.9 mm)   RECOMMENDATIONS   In the absence of any other complications or medical issues, we expect  the patient to be ready for discharge from an interventional cardiology  perspective on 04/15/2023.   Continue to titrate GDMT   Recommend dual antiplatelet therapy with Aspirin 81mg  daily and  Clopidogrel 75mg  daily long-term (beyond 12 months) because of Extensive  PCI to both the LAD and RCA.   Would continue DAPT for 1 year and then after 1 year would continue  long-term clopidogrel    Past Medical History:  Diagnosis Date   AICD (automatic cardioverter/defibrillator) present    [employer]   Allergic rhinitis    Anemia    Arthritis    Chronic systolic heart failure (HCC)    a. ECHO (12/2012) EF 25-30%, HK entireanteroseptal myocardium //  b.  EF 25%, diffuse HK, grade 1 diastolic dysfunction, MAC, mild LAE, normal RVSF, trivial pericardial effusion   COPD (chronic obstructive pulmonary disease) (HCC)    Diabetes mellitus  type II    Diabetic nephropathy (HCC)    Diabetic neuropathy (HCC)    ESRD on hemodialysis (HCC)    started HD June  2017, goes to [facility] HD unit, Dr Lieutenant Reese   History of cardiac catheterization    a.Myoview 1/15:  There is significant left ventricular dysfunction. There may be slight scar at the apex. There is no significant ischemia. LV Ejection Fraction: 27%  //  b. RHC/LHC (1/15) with mean RA 6, PA 47/22 mean 33, mean PCWP 20, PVR 2.5 WU, CI 2.5; 80% dLAD stenosis, 70% diffuse large D.     History of kidney stones    Hyperlipidemia    Hypertension    Kidney stones    NICM (nonischemic cardiomyopathy) (HCC)    Primarily nonischemic.  Echo (12/14) with EF 25-30%.  Echo (3/15) with EF 25%, mild to moderately dilated LV, normal RV size and systolic function.     Osteomyelitis (HCC)    left fifth ray   Pneumonia    Urethral stricture    Wears glasses     Past Surgical History:  Procedure Laterality Date   ABDOMINAL AORTOGRAM W/LOWER EXTREMITY N/A 03/30/2016   Procedure: Abdominal Aortogram w/Lower Extremity;  Surgeon: Dannis Dy, MD;  Location: [facility] INVASIVE CV LAB;  Service: Cardiovascular;  Laterality: N/A;   AMPUTATION Right 04/26/2016   Procedure: Right Below Knee Amputation;  Surgeon: Timothy Ford, MD;  Location: [facility] OR;  Service: Orthopedics;  Laterality: Right;   AMPUTATION Left 08/21/2019   Procedure: LEFT FOOT 5TH RAY AMPUTATION;  Surgeon: Timothy Ford, MD;  Location: [facility] OR;  Service: Orthopedics;  Laterality: Left;   AMPUTATION Left 11/13/2019   Procedure: LEFT BELOW KNEE AMPUTATION;  Surgeon: Timothy Ford, MD;  Location: [facility] OR;  Service: Orthopedics;  Laterality: Left;   AV FISTULA PLACEMENT Right 09/08/2015   Procedure: INSERTION OF 4-40mm x 45cm  ARTERIOVENOUS (AV) GORE-TEX GRAFT RIGHT UPPER  ARM;  Surgeon: Dannis Dy, MD;  Location: [facility] OR;  Service: Vascular;  Laterality: Right;   AV FISTULA PLACEMENT Left 01/14/2016   Procedure: CREATION OF LEFT  UPPER ARM ARTERIOVENOUS FISTULA;  Surgeon: Dannis Dy, MD;  Location: [facility] OR;  Service: Vascular;  Laterality: Left;   BASCILIC VEIN TRANSPOSITION Right 08/22/2014   Procedure: RIGHT UPPER ARM BASCILIC VEIN TRANSPOSITION;  Surgeon: Dannis Dy, MD;  Location: [facility] OR;  Service: Vascular;  Laterality: Right;   BELOW KNEE LEG AMPUTATION Right 04/26/2016   CARDIAC CATHETERIZATION     CARDIAC DEFIBRILLATOR PLACEMENT  06/27/2013   Sub Q       BY DR Rodolfo Clan   CATARACT EXTRACTION W/PHACO Right 08/06/2018   Procedure: CATARACT EXTRACTION PHACO AND INTRAOCULAR LENS PLACEMENT (IOC);  Surgeon: Tarri Farm, MD;  Location: [facility] ORS;  Service: Ophthalmology;  Laterality: Right;  CDE: 4.06   CATARACT EXTRACTION W/PHACO Left 08/20/2018   Procedure: CATARACT EXTRACTION PHACO AND INTRAOCULAR LENS PLACEMENT (IOC);  Surgeon: Tarri Farm, MD;  Location: [facility] ORS;  Service: Ophthalmology;  Laterality: Left;  CDE: 6.76   COLONOSCOPY WITH PROPOFOL N/A 07/22/2015   Procedure: COLONOSCOPY WITH PROPOFOL;  Surgeon: Albertina Hugger, MD;  Location: [facility] ENDOSCOPY;  Service: Gastroenterology;  Laterality: N/A;   CORONARY LITHOTRIPSY N/A 04/14/2023   Procedure: CORONARY LITHOTRIPSY;  Surgeon: Arleen Lacer, MD;  Location: [facility] INVASIVE CV LAB;  Service: Cardiovascular;  Laterality: N/A;  RCA   CORONARY STENT INTERVENTION N/A 04/12/2023   Procedure: CORONARY STENT INTERVENTION;  Surgeon: Swaziland, Peter M, MD;  Location: [facility] INVASIVE CV LAB;  Service: Cardiovascular;  Laterality: N/A;   CORONARY STENT INTERVENTION N/A 04/14/2023   Procedure: CORONARY STENT INTERVENTION;  Surgeon: Marykay Lex, MD;  Location: [facility] INVASIVE CV LAB;  Service: Cardiovascular;  Laterality: N/A;   FEMORAL-POPLITEAL BYPASS GRAFT Right 03/31/2016   Procedure: BYPASS GRAFT FEMORAL-POPLITEAL ARTERY USING RIGHT GREATER SAPHENOUS NONREVERSED VEIN;  Surgeon: Chuck Hint, MD;  Location: [facility] OR;  Service: Vascular;  Laterality: Right;   HERNIA  REPAIR     I & D EXTREMITY Right 03/31/2016   Procedure: IRRIGATION AND DEBRIDEMENT FOOT;  Surgeon: Chuck Hint, MD;  Location: [facility] OR;  Service: Vascular;  Laterality: Right;   IMPLANTABLE CARDIOVERTER DEFIBRILLATOR IMPLANT N/A 06/27/2013   Procedure: SUB Q ICD;  Surgeon: Duke Salvia, MD;  Location: [facility] CATH LAB;  Service: Cardiovascular;  Laterality: N/A;   INTRAOPERATIVE ARTERIOGRAM Right 03/31/2016   Procedure: INTRA OPERATIVE ARTERIOGRAM;  Surgeon: Chuck Hint, MD;  Location: [facility] OR;  Service: Vascular;  Laterality: Right;   IR GENERIC HISTORICAL Right 11/30/2015   IR THROMBECTOMY AV FISTULA W/THROMBOLYSIS/PTA INC/SHUNT/IMG RIGHT 11/30/2015 Irish Lack, MD [facility]   IR GENERIC HISTORICAL  11/30/2015   IR US GUIDE VASC ACCESS RIGHT 11/30/2015 Irish Lack, MD [facility]   IR GENERIC HISTORICAL Right 12/15/2015   IR THROMBECTOMY AV FISTULA W/THROMBOLYSIS/PTA/STENT INC/SHUNT/IMG RT 12/15/2015 Oley Balm, MD [facility]   IR GENERIC HISTORICAL  12/15/2015   IR US GUIDE VASC ACCESS RIGHT 12/15/2015 Oley Balm, MD [facility]   IR GENERIC HISTORICAL  12/28/2015   IR FLUORO GUIDE CV LINE RIGHT 12/28/2015 Jolaine Click, MD [facility]   IR GENERIC HISTORICAL  12/28/2015   IR US GUIDE VASC ACCESS RIGHT 12/28/2015 Jolaine Click, MD [facility]   LEFT A ND RIGHT HEART CATH  01/30/2013   DR Jones Broom   LEFT AND RIGHT HEART CATHETERIZATION WITH CORONARY ANGIOGRAM N/A 01/30/2013   Procedure: LEFT AND RIGHT HEART CATHETERIZATION WITH CORONARY ANGIOGRAM;  Surgeon: Dolores Patty, MD;  Location: [facility] CATH LAB;  Service: Cardiovascular;  Laterality: N/A;   LEFT HEART CATH AND CORONARY ANGIOGRAPHY N/A 04/12/2023   Procedure: LEFT HEART CATH AND CORONARY ANGIOGRAPHY;  Surgeon: Swaziland, Peter M, MD;  Location: [facility] INVASIVE CV LAB;  Service: Cardiovascular;  Laterality: N/A;   PERIPHERAL VASCULAR CATHETERIZATION Right 01/26/2015   Procedure: A/V Fistulagram;  Surgeon:  Chuck Hint, MD;  Location: [facility] INVASIVE CV LAB;  Service: Cardiovascular;  Laterality: Right;   reapea urethral surgery for recurrent obstruction  2011   TOTAL KNEE ARTHROPLASTY Right 2007   VEIN HARVEST Right 03/31/2016   Procedure: RIGHT GREATER SAPHENOUS VEIN HARVEST;  Surgeon: Chuck Hint, MD;  Location: [facility] OR;  Service: Vascular;  Laterality: Right;    Family History  Problem Relation Age of Onset   Bladder Cancer Mother    Alcohol abuse Father    Melanoma Father    Stroke Maternal Grandmother    Heart Problems Maternal Grandmother        unknown   Diabetes Maternal Grandmother    Heart disease Maternal Grandfather    Prostate cancer Maternal Grandfather     Prior to Admission medications   Medication Sig Start Date End Date Taking? Authorizing Provider  acetaminophen (TYLENOL) 325 MG tablet Take 1-2 tablets (325-650 mg total) by mouth every 4 (four) hours as needed for mild pain. 11/29/19   Love, Evlyn Kanner, PA-C  apixaban (ELIQUIS) 5 MG TABS tablet Take 1 tablet (5 mg total) by mouth 2 (two) times daily. 12/21/22   Burchette, Elberta Fortis, MD  aspirin EC 81 MG tablet Take 1 tablet (81 mg  total) by mouth daily. Swallow whole. 04/15/23   Hongalgi, Anand D, MD  atorvastatin (LIPITOR) 80 MG tablet TAKE 1 TABLET BY MOUTH AT BEDTIME Patient taking differently: Take 80 mg by mouth daily. 06/24/22   Burchette, Marijean Shouts, MD  carvedilol (COREG) 12.5 MG tablet Take 1 tablet (12.5 mg total) by mouth 2 (two) times daily. 01/25/23   Burchette, Marijean Shouts, MD  cinacalcet (SENSIPAR) 30 MG tablet Take 1 tablet (30 mg total) by mouth Every Tuesday,Thursday,and Saturday with dialysis. 11/30/19   Love, Renay Carota, PA-C  clopidogrel (PLAVIX) 75 MG tablet Take 1 tablet (75 mg total) by mouth daily with breakfast. 04/16/23   Hongalgi, Thomasene Flemings, MD  Continuous Glucose Receiver (FREESTYLE LIBRE 3 READER) DEVI Use to check blood glucose TID 08/11/22   Burchette, Marijean Shouts, MD  Continuous Glucose Sensor  (FREESTYLE LIBRE 3 PLUS SENSOR) MISC Change sensor every 15 days. Use as directed to check glucose daily 12/21/22   Burchette, Marijean Shouts, MD  gabapentin (NEURONTIN) 300 MG capsule Take 1 capsule (300 mg total) by mouth at bedtime. 04/15/23   Hongalgi, Anand D, MD  glucose blood test strip Check 1 time daily. E11.9 One Touch Ultra Blue Test Strips 03/10/14   Burchette, Marijean Shouts, MD  HUMALOG KWIKPEN 200 UNIT/ML KwikPen INJECT A MAXIMUM OF 28 UNITS SUBCUTANEOUSLY TWICE DAILY WITH LUNCH AND SUPPER PER SLIDING SCALE. APPOINTMENT REQUIRED FOR FUTURE REFILLS 11/11/22   Burchette, Marijean Shouts, MD  Insulin Pen Needle (BD PEN NEEDLE NANO U/F) 32G X 4 MM MISC USE 1 PEN NEEDLE SUBCUTANEOUSLY WITH INSULIN 4 TIMES DAILY 12/04/19   Burchette, Marijean Shouts, MD  LANTUS SOLOSTAR 100 UNIT/ML Solostar Pen INJECT 12 UNITS SUBCUTANEOUSLY AT BEDTIME Patient taking differently: Inject 14 Units into the skin at bedtime. 07/15/22   Burchette, Marijean Shouts, MD  midodrine (PROAMATINE) 10 MG tablet Take 10 mg by mouth 3 (three) times a week. EVERY TUESDAY, THURSDAY, AND SATURDAY 08/04/20   [provider]  multivitamin (RENA-VIT) TABS tablet Take 1 tablet by mouth once daily Patient taking differently: Take 1 tablet by mouth daily. 07/06/21   Burchette, Marijean Shouts, MD  nitroGLYCERIN (NITROSTAT) 0.4 MG SL tablet Place 1 tablet (0.4 mg total) under the tongue every 5 (five) minutes as needed for chest pain. 04/21/23   Burchette, Marijean Shouts, MD  Olopatadine HCl 0.2 % SOLN Place 1 drop into both eyes daily as needed (for allergies).     [provider]  sevelamer (RENAGEL) 800 MG tablet Take 1,600 mg by mouth 3 (three) times daily with meals.    [provider]  VENTOLIN HFA 108 (90 Base) MCG/ACT inhaler Inhale 1-2 puffs into the lungs every 6 (six) hours as needed for wheezing or shortness of breath. 03/20/23   Burchette, Marijean Shouts, MD    Current Facility-Administered Medications  Medication Dose Route Frequency Provider Last Rate Last  Admin   0.9 %  sodium chloride infusion (Manually program via Guardrails IV Fluids)   Intravenous Once Smith, Rondell A, MD   Held at 04/27/23 0845   acetaminophen (TYLENOL) tablet 650 mg  650 mg Oral Q6H PRN Smith, Rondell A, MD       Or   acetaminophen (TYLENOL) suppository 650 mg  650 mg Rectal Q6H PRN Smith, Rondell A, MD       albuterol (PROVENTIL) (2.5 MG/3ML) 0.083% nebulizer solution 2.5 mg  2.5 mg Nebulization Q6H PRN Smith, Rondell A, MD       atorvastatin (LIPITOR) tablet 80 mg  80 mg Oral QPM Smith, Rondell A, MD       Chlorhexidine Gluconate Cloth 2 % PADS 6 each  6 each Topical Daily Pollina, Canary Brim, MD       gabapentin (NEURONTIN) capsule 300 mg  300 mg Oral QHS Clydie Braun, MD       [START ON 04/28/2023] midodrine (PROAMATINE) tablet 10 mg  10 mg Oral Once per day on Monday Wednesday Friday Clydie Braun, MD       multivitamin (RENA-VIT) tablet 1 tablet  1 tablet Oral Daily Smith, Rondell A, MD       ondansetron (ZOFRAN) tablet 4 mg  4 mg Oral Q6H PRN Clydie Braun, MD       Or   ondansetron (ZOFRAN) injection 4 mg  4 mg Intravenous Q6H PRN Smith, Rondell A, MD       pantoprazole (PROTONIX) injection 40 mg  40 mg Intravenous Q6H Pollina, Canary Brim, MD       Followed by   Melene Muller ON 04/30/2023] pantoprazole (PROTONIX) injection 40 mg  40 mg Intravenous Q12H Pollina, Canary Brim, MD       sodium chloride flush (NS) 0.9 % injection 10-40 mL  10-40 mL Intracatheter PRN Pollina, Canary Brim, MD       sodium chloride flush (NS) 0.9 % injection 3 mL  3 mL Intravenous Q12H Clydie Braun, MD       Current Outpatient Medications  Medication Sig Dispense Refill   acetaminophen (TYLENOL) 325 MG tablet Take 1-2 tablets (325-650 mg total) by mouth every 4 (four) hours as needed for mild pain.     apixaban (ELIQUIS) 5 MG TABS tablet Take 1 tablet (5 mg total) by mouth 2 (two) times daily. 180 tablet 3   aspirin EC 81 MG tablet Take 1 tablet (81 mg total) by mouth  daily. Swallow whole. 30 tablet 2   atorvastatin (LIPITOR) 80 MG tablet TAKE 1 TABLET BY MOUTH AT BEDTIME (Patient taking differently: Take 80 mg by mouth daily.) 90 tablet 3   carvedilol (COREG) 12.5 MG tablet Take 1 tablet (12.5 mg total) by mouth 2 (two) times daily. 180 tablet 3   cinacalcet (SENSIPAR) 30 MG tablet Take 1 tablet (30 mg total) by mouth Every Tuesday,Thursday,and Saturday with dialysis. 60 tablet    clopidogrel (PLAVIX) 75 MG tablet Take 1 tablet (75 mg total) by mouth daily with breakfast. 30 tablet 2   Continuous Glucose Receiver (FREESTYLE LIBRE 3 READER) DEVI Use to check blood glucose TID 1 each 0   Continuous Glucose Sensor (FREESTYLE LIBRE 3 PLUS SENSOR) MISC Change sensor every 15 days. Use as directed to check glucose daily 6 each 3   gabapentin (NEURONTIN) 300 MG capsule Take 1 capsule (300 mg total) by mouth at bedtime. 30 capsule 2   glucose blood test strip Check 1 time daily. E11.9 One Touch Ultra Blue Test Strips 100 each 3   HUMALOG KWIKPEN 200 UNIT/ML KwikPen INJECT A MAXIMUM OF 28 UNITS SUBCUTANEOUSLY TWICE DAILY WITH LUNCH AND SUPPER PER SLIDING SCALE. APPOINTMENT REQUIRED FOR FUTURE REFILLS 6 mL 5   Insulin Pen Needle (BD PEN NEEDLE NANO U/F) 32G X 4 MM MISC USE 1 PEN NEEDLE SUBCUTANEOUSLY WITH INSULIN 4 TIMES DAILY 400 each 0   LANTUS SOLOSTAR 100 UNIT/ML Solostar Pen INJECT 12 UNITS SUBCUTANEOUSLY AT BEDTIME (Patient taking differently: Inject 14 Units into the skin at bedtime.) 15 mL 0   midodrine (PROAMATINE) 10 MG tablet Take 10 mg by  mouth 3 (three) times a week. EVERY TUESDAY, THURSDAY, AND SATURDAY     multivitamin (RENA-VIT) TABS tablet Take 1 tablet by mouth once daily (Patient taking differently: Take 1 tablet by mouth daily.) 10 tablet 3   nitroGLYCERIN (NITROSTAT) 0.4 MG SL tablet Place 1 tablet (0.4 mg total) under the tongue every 5 (five) minutes as needed for chest pain. 30 tablet 1   Olopatadine HCl 0.2 % SOLN Place 1 drop into both eyes daily  as needed (for allergies).      sevelamer (RENAGEL) 800 MG tablet Take 1,600 mg by mouth 3 (three) times daily with meals.     VENTOLIN HFA 108 (90 Base) MCG/ACT inhaler Inhale 1-2 puffs into the lungs every 6 (six) hours as needed for wheezing or shortness of breath. 18 g 1    Allergies as of 04/27/2023 - Review Complete 04/27/2023  Allergen Reaction Noted   Epoetin alfa Other (See Comments) 07/09/2019   Ferumoxytol Other (See Comments) 07/09/2019   Morphine sulfate Rash and Other (See Comments) 05/07/2008    Social History   Socioeconomic History   Marital status: Married    Spouse name: Not on file   Number of children: 0   Years of education: Not on file   Highest education level: Not on file  Occupational History   Not on file  Tobacco Use   Smoking status: Former    Current packs/day: 0.00    Average packs/day: 2.0 packs/day for 32.0 years (64.0 ttl pk-yrs)    Types: Cigarettes    Start date: 05/11/1978    Quit date: 05/11/2010    Years since quitting: 12.9   Smokeless tobacco: Never  Vaping Use   Vaping status: Never Used  Substance and Sexual Activity   Alcohol use: No   Drug use: No   Sexual activity: Yes  Other Topics Concern   Not on file  Social History Narrative   Works at [employer] as a Therapist, music      Right handed   Wears glasses    Drinks 10 oz coffee daily   Drinks 2 sodas per day      Pt disabled    Lives with wife    Social Drivers of Corporate investment banker Strain: Low Risk  (02/03/2023)   Overall Financial Resource Strain (CARDIA)    Difficulty of Paying Living Expenses: Not hard at all  Food Insecurity: No Food Insecurity (04/17/2023)   Hunger Vital Sign    Worried About Running Out of Food in the Last Year: Never true    Ran Out of Food in the Last Year: Never true  Transportation Needs: No Transportation Needs (04/17/2023)   PRAPARE - Administrator, Civil Service (Medical): No    Lack of Transportation (Non-Medical): No   Physical Activity: Inactive (02/03/2023)   Exercise Vital Sign    Days of Exercise per Week: 0 days    Minutes of Exercise per Session: 0 min  Stress: No Stress Concern Present (02/03/2023)   [employer] of Occupational Health - Occupational Stress Questionnaire    Feeling of Stress : Not at all  Social Connections: Socially Integrated (03/07/2023)   Social Connection and Isolation Panel [NHANES]    Frequency of Communication with Friends and Family: More than three times a week    Frequency of Social Gatherings with Friends and Family: More than three times a week    Attends Religious Services: More than 4 times per year  Active Member of Clubs or Organizations: Yes    Attends Banker Meetings: More than 4 times per year    Marital Status: Married  Catering manager Violence: Not At Risk (04/17/2023)   Humiliation, Afraid, Rape, and Kick questionnaire    Fear of Current or Ex-Partner: No    Emotionally Abused: No    Physically Abused: No    Sexually Abused: No     Code Status   Code Status: Full Code  Review of Systems: All systems reviewed and negative except where noted in HPI.  Physical Exam: Vital signs in last 24 hours: Temp:  [97.5 F (36.4 C)-98.3 F (36.8 C)] 98.3 F (36.8 C) (04/17 0945) Pulse Rate:  [93-107] 96 (04/17 1011) Resp:  [9-25] 20 (04/17 1011) BP: (84-121)/(43-91) 84/47 (04/17 1011) SpO2:  [96 %-100 %] 100 % (04/17 1011) Weight:  [89.4 kg] 89.4 kg (04/17 0347)    General:  Pleasant male in NAD Psych:  Cooperative. Normal mood and affect Eyes: Pupils equal Ears:  Normal auditory acuity Nose: No deformity, discharge or lesions Neck:  Supple, no masses felt Lungs:  Clear to auscultation.  Heart:  Regular rate.  Abdomen:  Soft, nondistended, nontender, active bowel sounds, no masses felt Rectal :  Deferred Msk: Symmetrical without gross deformities.  Neurologic:  Alert, oriented, grossly normal neurologically Extremities :  Bilateral BKA  Skin:  Intact without significant lesions.    Intake/Output from previous day: No intake/output data recorded. Intake/Output this shift:  No intake/output data recorded.   Mai Schwalbe, NP-C   04/27/2023, 10:48 AM

## 2023-04-27 NOTE — ED Notes (Signed)
 Pt says he does not urinate (dialysis).

## 2023-04-27 NOTE — ED Notes (Signed)
 PT's BP was running soft and PT states that he usually runs low after a dialysis tx. Pt states that he is feeling weak but this is baseline for him after a tx. This RN notified PT to alert when he starts feeling worse than his baseline. PT was agreeable.Physician notified.

## 2023-04-27 NOTE — ED Provider Notes (Signed)
 [place] EMERGENCY DEPARTMENT AT [facility] Provider Note   CSN: [id number] Arrival date & time: 04/27/23  0341     History  Chief Complaint  Patient presents with   Diarrhea    Anthony Bullock is a 63 y.o. male.  Patient presents to the emergency department for evaluation of nausea, vomiting, diarrhea.  Symptoms ongoing for 3 days.  Patient reports that he did recently have stents placed, he is not experiencing any chest pain.  Patient denies fever.  He has had some mild cough, nonproductive.       Home Medications Prior to Admission medications   Medication Sig Start Date End Date Taking? Authorizing Provider  acetaminophen (TYLENOL) 325 MG tablet Take 1-2 tablets (325-650 mg total) by mouth every 4 (four) hours as needed for mild pain. 11/29/19   Love, Renay Carota, PA-C  apixaban (ELIQUIS) 5 MG TABS tablet Take 1 tablet (5 mg total) by mouth 2 (two) times daily. 12/21/22   Burchette, Marijean Shouts, MD  aspirin EC 81 MG tablet Take 1 tablet (81 mg total) by mouth daily. Swallow whole. 04/15/23   Hongalgi, Anand D, MD  atorvastatin (LIPITOR) 80 MG tablet TAKE 1 TABLET BY MOUTH AT BEDTIME Patient taking differently: Take 80 mg by mouth daily. 06/24/22   Burchette, Marijean Shouts, MD  carvedilol (COREG) 12.5 MG tablet Take 1 tablet (12.5 mg total) by mouth 2 (two) times daily. 01/25/23   Burchette, Marijean Shouts, MD  cinacalcet (SENSIPAR) 30 MG tablet Take 1 tablet (30 mg total) by mouth Every Tuesday,Thursday,and Saturday with dialysis. 11/30/19   Love, Renay Carota, PA-C  clopidogrel (PLAVIX) 75 MG tablet Take 1 tablet (75 mg total) by mouth daily with breakfast. 04/16/23   Hongalgi, Thomasene Flemings, MD  Continuous Glucose Receiver (FREESTYLE LIBRE 3 READER) DEVI Use to check blood glucose TID 08/11/22   Burchette, Marijean Shouts, MD  Continuous Glucose Sensor (FREESTYLE LIBRE 3 PLUS SENSOR) MISC Change sensor every 15 days. Use as directed to check glucose daily 12/21/22   Burchette, Marijean Shouts, MD  gabapentin  (NEURONTIN) 300 MG capsule Take 1 capsule (300 mg total) by mouth at bedtime. 04/15/23   Hongalgi, Anand D, MD  glucose blood test strip Check 1 time daily. E11.9 One Touch Ultra Blue Test Strips 03/10/14   Burchette, Marijean Shouts, MD  HUMALOG KWIKPEN 200 UNIT/ML KwikPen INJECT A MAXIMUM OF 28 UNITS SUBCUTANEOUSLY TWICE DAILY WITH LUNCH AND SUPPER PER SLIDING SCALE. APPOINTMENT REQUIRED FOR FUTURE REFILLS 11/11/22   Burchette, Marijean Shouts, MD  Insulin Pen Needle (BD PEN NEEDLE NANO U/F) 32G X 4 MM MISC USE 1 PEN NEEDLE SUBCUTANEOUSLY WITH INSULIN 4 TIMES DAILY 12/04/19   Burchette, Marijean Shouts, MD  LANTUS SOLOSTAR 100 UNIT/ML Solostar Pen INJECT 12 UNITS SUBCUTANEOUSLY AT BEDTIME Patient taking differently: Inject 14 Units into the skin at bedtime. 07/15/22   Burchette, Marijean Shouts, MD  midodrine (PROAMATINE) 10 MG tablet Take 10 mg by mouth 3 (three) times a week. EVERY TUESDAY, THURSDAY, AND SATURDAY 08/04/20   [provider]  multivitamin (RENA-VIT) TABS tablet Take 1 tablet by mouth once daily Patient taking differently: Take 1 tablet by mouth daily. 07/06/21   Burchette, Marijean Shouts, MD  nitroGLYCERIN (NITROSTAT) 0.4 MG SL tablet Place 1 tablet (0.4 mg total) under the tongue every 5 (five) minutes as needed for chest pain. 04/21/23   Burchette, Marijean Shouts, MD  Olopatadine HCl 0.2 % SOLN Place 1 drop into both eyes daily as needed (for allergies).  [provider]  sevelamer (RENAGEL) 800 MG tablet Take 1,600 mg by mouth 3 (three) times daily with meals.    [provider]  VENTOLIN HFA 108 (90 Base) MCG/ACT inhaler Inhale 1-2 puffs into the lungs every 6 (six) hours as needed for wheezing or shortness of breath. 03/20/23   Burchette, Elberta Fortis, MD      Allergies    Epoetin alfa, Ferumoxytol, and Morphine sulfate    Review of Systems   Review of Systems  Physical Exam Updated Vital Signs BP (!) 118/52   Pulse 100   Temp 98 F (36.7 C) (Oral)   Resp 15   Ht 5\' 10"  (1.778 m)   Wt 89.4 kg    SpO2 100%   BMI 28.27 kg/m  Physical Exam Vitals and nursing note reviewed.  Constitutional:      General: He is not in acute distress.    Appearance: He is well-developed.  HENT:     Head: Normocephalic and atraumatic.     Mouth/Throat:     Mouth: Mucous membranes are moist.  Eyes:     General: Vision grossly intact. Gaze aligned appropriately.     Extraocular Movements: Extraocular movements intact.     Conjunctiva/sclera: Conjunctivae normal.  Cardiovascular:     Rate and Rhythm: Regular rhythm. Tachycardia present.     Pulses: Normal pulses.     Heart sounds: Normal heart sounds, S1 normal and S2 normal. No murmur heard.    No friction rub. No gallop.  Pulmonary:     Effort: Pulmonary effort is normal. No respiratory distress.     Breath sounds: Normal breath sounds.  Abdominal:     Palpations: Abdomen is soft.     Tenderness: There is no abdominal tenderness. There is no guarding or rebound.     Hernia: No hernia is present.  Musculoskeletal:        General: No swelling.     Cervical back: Full passive range of motion without pain, normal range of motion and neck supple. No pain with movement, spinous process tenderness or muscular tenderness. Normal range of motion.     Right lower leg: No edema.     Left lower leg: No edema.  Skin:    General: Skin is warm and dry.     Capillary Refill: Capillary refill takes less than 2 seconds.     Findings: No ecchymosis, erythema, lesion or wound.  Neurological:     Mental Status: He is alert and oriented to person, place, and time.     GCS: GCS eye subscore is 4. GCS verbal subscore is 5. GCS motor subscore is 6.     Cranial Nerves: Cranial nerves 2-12 are intact.     Sensory: Sensation is intact.     Motor: Motor function is intact. No weakness or abnormal muscle tone.     Coordination: Coordination is intact.  Psychiatric:        Mood and Affect: Mood normal.        Speech: Speech normal.        Behavior: Behavior  normal.     ED Results / Procedures / Treatments   Labs (all labs ordered are listed, but only abnormal results are displayed) Labs Reviewed  COMPREHENSIVE METABOLIC PANEL WITH GFR - Abnormal; Notable for the following components:      Result Value   Sodium 130 (*)    Chloride 91 (*)    CO2 19 (*)    Glucose, Bld 197 (*)  BUN 95 (*)    Creatinine, Ser 8.20 (*)    Calcium 8.3 (*)    Total Protein 5.9 (*)    Albumin 2.6 (*)    GFR, Estimated 7 (*)    Anion gap 20 (*)    All other components within normal limits  CBC - Abnormal; Notable for the following components:   WBC 21.0 (*)    RBC 1.87 (*)    Hemoglobin 5.6 (*)    HCT 17.4 (*)    All other components within normal limits  CBG MONITORING, ED - Abnormal; Notable for the following components:   Glucose-Capillary 195 (*)    All other components within normal limits  POC OCCULT BLOOD, ED - Abnormal; Notable for the following components:   Fecal Occult Bld POSITIVE (*)    All other components within normal limits  TROPONIN I (HIGH SENSITIVITY) - Abnormal; Notable for the following components:   Troponin I (High Sensitivity) 345 (*)    All other components within normal limits  GASTROINTESTINAL PANEL BY PCR, STOOL (REPLACES STOOL CULTURE)  C DIFFICILE QUICK SCREEN W PCR REFLEX    LIPASE, BLOOD  TYPE AND SCREEN  TROPONIN I (HIGH SENSITIVITY)    EKG EKG Interpretation Date/Time:  Thursday April 27 2023 03:46:59 EDT Ventricular Rate:  101 PR Interval:  154 QRS Duration:  141 QT Interval:  395 QTC Calculation: 512 R Axis:   -41  Text Interpretation: Sinus tachycardia Nonspecific IVCD with LAD Abnormal T, consider ischemia, lateral leads No significant change since last tracing Confirmed by Ballard Bongo [phone]) on 04/27/2023 3:54:31 AM  Radiology No results found.  Procedures Procedures    Medications Ordered in ED Medications  sodium chloride flush (NS) 0.9 % injection 10-40 mL (has no  administration in time range)  Chlorhexidine Gluconate Cloth 2 % PADS 6 each (has no administration in time range)  pantoprazole (PROTONIX) injection 40 mg (40 mg Intravenous Given 04/27/23 0605)    Followed by  pantoprazole (PROTONIX) injection 40 mg (has no administration in time range)    Followed by  pantoprazole (PROTONIX) injection 40 mg (has no administration in time range)    ED Course/ Medical Decision Making/ A&P                                 Medical Decision Making Amount and/or Complexity of Data Reviewed Labs: ordered.  Risk OTC drugs. Prescription drug management. Decision regarding hospitalization.   Patient presents to the emergency department for evaluation of nausea, vomiting, diarrhea that has been ongoing for 3 days.  Patient with a burning pain in his epigastric area.  He denies active chest pain but does report that he recently had a heart attack.  Records reveal that he had an NSTEMI on April 5 and underwent stenting.  Patient is currently on aspirin, Plavix and Eliquis (history of paroxysmal atrial fibrillation).  Patient found to have a hemoglobin of 5.6.  Patient does report recently being seen in urgent care for epigastric discomfort and started on Protonix for reflux.  Took the Protonix for a month but it was never refilled by anybody.  He does report that he is having dark stools.  A rectal exam reveals black stools consistent with upper GI bleed.  No hematemesis with his vomiting.  Patient will be initiated on IV Protonix and admitted to the hospitalist service for further workup.  Troponin is mildly elevated.  When  he was admitted to the hospital earlier this month his troponins were 16,000.  This may be residual, but he likely has some mild elevation secondary to leak from his anemia.  EKG unchanged.  I do not feel he is experiencing an acute coronary syndrome.        Final Clinical Impression(s) / ED Diagnoses Final diagnoses:  Nausea vomiting  and diarrhea  UGI bleed    Rx / DC Orders ED Discharge Orders     None         Ballard Bongo, MD 04/27/23 [phone]

## 2023-04-28 ENCOUNTER — Encounter (HOSPITAL_COMMUNITY)
Admission: EM | Disposition: A | Discharge status: Home Health | Payer: Self-pay | Source: Home / Self Care | Attending: Internal Medicine

## 2023-04-28 ENCOUNTER — Encounter (HOSPITAL_COMMUNITY): Payer: Self-pay | Admitting: Family Medicine

## 2023-04-28 ENCOUNTER — Telehealth (HOSPITAL_COMMUNITY): Payer: Self-pay | Admitting: Pharmacy Technician

## 2023-04-28 ENCOUNTER — Other Ambulatory Visit (HOSPITAL_COMMUNITY): Payer: Self-pay

## 2023-04-28 ENCOUNTER — Inpatient Hospital Stay (HOSPITAL_COMMUNITY)

## 2023-04-28 DIAGNOSIS — K3189 Other diseases of stomach and duodenum: Secondary | ICD-10-CM

## 2023-04-28 DIAGNOSIS — A0472 Enterocolitis due to Clostridium difficile, not specified as recurrent: Secondary | ICD-10-CM

## 2023-04-28 DIAGNOSIS — D62 Acute posthemorrhagic anemia: Secondary | ICD-10-CM | POA: Diagnosis not present

## 2023-04-28 DIAGNOSIS — Z01810 Encounter for preprocedural cardiovascular examination: Secondary | ICD-10-CM | POA: Diagnosis not present

## 2023-04-28 DIAGNOSIS — K2970 Gastritis, unspecified, without bleeding: Secondary | ICD-10-CM

## 2023-04-28 DIAGNOSIS — K2950 Unspecified chronic gastritis without bleeding: Secondary | ICD-10-CM

## 2023-04-28 DIAGNOSIS — I480 Paroxysmal atrial fibrillation: Secondary | ICD-10-CM | POA: Diagnosis not present

## 2023-04-28 DIAGNOSIS — J449 Chronic obstructive pulmonary disease, unspecified: Secondary | ICD-10-CM | POA: Diagnosis not present

## 2023-04-28 DIAGNOSIS — K269 Duodenal ulcer, unspecified as acute or chronic, without hemorrhage or perforation: Secondary | ICD-10-CM | POA: Diagnosis not present

## 2023-04-28 DIAGNOSIS — I10 Essential (primary) hypertension: Secondary | ICD-10-CM

## 2023-04-28 DIAGNOSIS — I5022 Chronic systolic (congestive) heart failure: Secondary | ICD-10-CM | POA: Diagnosis not present

## 2023-04-28 DIAGNOSIS — I2510 Atherosclerotic heart disease of native coronary artery without angina pectoris: Secondary | ICD-10-CM

## 2023-04-28 DIAGNOSIS — K920 Hematemesis: Secondary | ICD-10-CM | POA: Diagnosis not present

## 2023-04-28 HISTORY — PX: ESOPHAGOGASTRODUODENOSCOPY: SHX5428

## 2023-04-28 HISTORY — PX: BIOPSY OF SKIN SUBCUTANEOUS TISSUE AND/OR MUCOUS MEMBRANE: SHX6741

## 2023-04-28 LAB — GASTROINTESTINAL PANEL BY PCR, STOOL (REPLACES STOOL CULTURE)

## 2023-04-28 LAB — CBC
HCT: 21.3 % — ABNORMAL LOW (ref 39.0–52.0)
HCT: 19.4 % — ABNORMAL LOW (ref 39.0–52.0)
HCT: 22.6 % — ABNORMAL LOW (ref 39.0–52.0)
Hemoglobin: 7.2 g/dL — ABNORMAL LOW (ref 13.0–17.0)
Hemoglobin: 6.4 g/dL — CL (ref 13.0–17.0)
Hemoglobin: 7.5 g/dL — ABNORMAL LOW (ref 13.0–17.0)
MCH: 30.8 pg (ref 26.0–34.0)
MCH: 30.3 pg (ref 26.0–34.0)
MCH: 30.2 pg (ref 26.0–34.0)
MCHC: 33.8 g/dL (ref 30.0–36.0)
MCHC: 33 g/dL (ref 30.0–36.0)
MCHC: 33.2 g/dL (ref 30.0–36.0)
MCV: 91 fL (ref 80.0–100.0)
MCV: 91.9 fL (ref 80.0–100.0)
MCV: 91.1 fL (ref 80.0–100.0)
Platelets: 218 10*3/uL (ref 150–400)
Platelets: 210 10*3/uL (ref 150–400)
Platelets: 185 10*3/uL (ref 150–400)
RBC: 2.34 MIL/uL — ABNORMAL LOW (ref 4.22–5.81)
RBC: 2.11 MIL/uL — ABNORMAL LOW (ref 4.22–5.81)
RBC: 2.48 MIL/uL — ABNORMAL LOW (ref 4.22–5.81)
RDW: 16 % — ABNORMAL HIGH (ref 11.5–15.5)
RDW: 16.3 % — ABNORMAL HIGH (ref 11.5–15.5)
RDW: 15.9 % — ABNORMAL HIGH (ref 11.5–15.5)
WBC: 35.6 10*3/uL — ABNORMAL HIGH (ref 4.0–10.5)
WBC: 32.3 10*3/uL — ABNORMAL HIGH (ref 4.0–10.5)
WBC: 30.3 10*3/uL — ABNORMAL HIGH (ref 4.0–10.5)
nRBC: 0 % (ref 0.0–0.2)
nRBC: 0 % (ref 0.0–0.2)
nRBC: 0 % (ref 0.0–0.2)

## 2023-04-28 LAB — RENAL FUNCTION PANEL
Albumin: 2.3 g/dL — ABNORMAL LOW (ref 3.5–5.0)
Anion gap: 21 — ABNORMAL HIGH (ref 5–15)
BUN: 55 mg/dL — ABNORMAL HIGH (ref 8–23)
CO2: 18 mmol/L — ABNORMAL LOW (ref 22–32)
Calcium: 8.1 mg/dL — ABNORMAL LOW (ref 8.9–10.3)
Chloride: 91 mmol/L — ABNORMAL LOW (ref 98–111)
Creatinine, Ser: 5.93 mg/dL — ABNORMAL HIGH (ref 0.61–1.24)
GFR, Estimated: 10 mL/min — ABNORMAL LOW (ref >60)
Glucose, Bld: 273 mg/dL — ABNORMAL HIGH (ref 70–99)
Phosphorus: 5.6 mg/dL — ABNORMAL HIGH (ref 2.5–4.6)
Potassium: 4.2 mmol/L (ref 3.5–5.1)
Sodium: 130 mmol/L — ABNORMAL LOW (ref 135–145)

## 2023-04-28 LAB — PREPARE RBC (CROSSMATCH)

## 2023-04-28 LAB — HEMOGLOBIN AND HEMATOCRIT, BLOOD
HCT: 28.2 % — ABNORMAL LOW (ref 39.0–52.0)
Hemoglobin: 9.8 g/dL — ABNORMAL LOW (ref 13.0–17.0)

## 2023-04-28 LAB — HEPATITIS B SURFACE ANTIGEN: Hepatitis B Surface Ag: NONREACTIVE

## 2023-04-28 LAB — LACTIC ACID, PLASMA
Lactic Acid, Venous: 3.3 mmol/L (ref 0.5–1.9)
Lactic Acid, Venous: 2.3 mmol/L (ref 0.5–1.9)

## 2023-04-28 LAB — GLUCOSE, CAPILLARY
Glucose-Capillary: 235 mg/dL — ABNORMAL HIGH (ref 70–99)
Glucose-Capillary: 267 mg/dL — ABNORMAL HIGH (ref 70–99)

## 2023-04-28 LAB — C DIFFICILE QUICK SCREEN W PCR REFLEX
C Diff antigen: POSITIVE — AB
C Diff interpretation: DETECTED
C Diff toxin: POSITIVE — AB

## 2023-04-28 LAB — CBG MONITORING, ED: Glucose-Capillary: 290 mg/dL — ABNORMAL HIGH (ref 70–99)

## 2023-04-28 SURGERY — EGD (ESOPHAGOGASTRODUODENOSCOPY)
Anesthesia: Monitor Anesthesia Care

## 2023-04-28 MED ORDER — SODIUM CHLORIDE 0.9% IV SOLUTION: Freq: Once | INTRAVENOUS | Status: DC

## 2023-04-28 MED ORDER — CHLORHEXIDINE GLUCONATE CLOTH 2 % EX PADS
6.0000 | MEDICATED_PAD | Freq: Every day | CUTANEOUS | Status: DC
  Administered 2023-04-29 – 2023-04-30 (×2): 6 via TOPICAL

## 2023-04-28 MED ORDER — LIDOCAINE 2% (20 MG/ML) 5 ML SYRINGE
INTRAMUSCULAR | Status: DC | PRN
  Administered 2023-04-28: 50 mg via INTRAVENOUS

## 2023-04-28 MED ORDER — PHENYLEPHRINE HCL (PRESSORS) 10 MG/ML IV SOLN
INTRAVENOUS | Status: DC | PRN
Start: 2023-04-28 — End: 2023-04-28
  Administered 2023-04-28: 240 ug via INTRAVENOUS
  Administered 2023-04-28 (×3): 160 ug via INTRAVENOUS
  Administered 2023-04-28 (×2): 240 ug via INTRAVENOUS

## 2023-04-28 MED ORDER — SODIUM CHLORIDE 0.9% IV SOLUTION: Freq: Once | INTRAVENOUS | Status: AC

## 2023-04-28 MED ORDER — CLOPIDOGREL BISULFATE 75 MG PO TABS
75.0000 mg | ORAL_TABLET | Freq: Every day | ORAL | Status: DC
  Administered 2023-04-29 – 2023-05-02 (×4): 75 mg via ORAL
  Filled 2023-04-28 (×4): qty 1

## 2023-04-28 MED ORDER — VANCOMYCIN HCL 125 MG PO CAPS
125.0000 mg | ORAL_CAPSULE | Freq: Three times a day (TID) | ORAL | Status: DC
  Administered 2023-04-28 – 2023-04-29 (×6): 125 mg via ORAL
  Filled 2023-04-28 (×9): qty 1

## 2023-04-28 MED ORDER — PROPOFOL 500 MG/50ML IV EMUL
INTRAVENOUS | Status: DC | PRN
  Administered 2023-04-28: 100 ug/kg/min via INTRAVENOUS

## 2023-04-28 MED ORDER — LACTATED RINGERS IV BOLUS
500.0000 mL | Freq: Once | INTRAVENOUS | Status: AC
  Administered 2023-04-28: 500 mL via INTRAVENOUS

## 2023-04-28 MED ORDER — SODIUM CHLORIDE 0.9 % IV SOLN
INTRAVENOUS | Status: DC | PRN
Start: 2023-04-28 — End: 2023-04-28

## 2023-04-28 MED ORDER — SEVELAMER CARBONATE 800 MG PO TABS
1600.0000 mg | ORAL_TABLET | Freq: Three times a day (TID) | ORAL | Status: DC
  Administered 2023-04-29 – 2023-05-02 (×10): 1600 mg via ORAL
  Filled 2023-04-28 (×11): qty 2

## 2023-04-28 MED ORDER — CINACALCET HCL 30 MG PO TABS
30.0000 mg | ORAL_TABLET | ORAL | Status: DC
  Administered 2023-04-29 – 2023-05-02 (×2): 30 mg via ORAL
  Filled 2023-04-28 (×2): qty 1

## 2023-04-28 MED ORDER — PROPOFOL 10 MG/ML IV BOLUS
INTRAVENOUS | Status: DC | PRN
  Administered 2023-04-28: 50 mg via INTRAVENOUS

## 2023-04-28 MED ORDER — VASOPRESSIN 20 UNIT/ML IV SOLN
INTRAVENOUS | Status: DC | PRN
  Administered 2023-04-28 (×2): 1 [IU] via INTRAVENOUS

## 2023-04-28 NOTE — ED Notes (Signed)
Called lab, will call back

## 2023-04-28 NOTE — ED Notes (Signed)
 Patient a/o cardiac monitoring in progress, no complaints offered at this time. Patient's BP are running low. Provider made aware. Provider to bedside

## 2023-04-28 NOTE — Interval H&P Note (Signed)
 History and Physical Interval Note:  04/28/2023 12:55 PM  Anthony Bullock  has presented today for surgery, with the diagnosis of Hematemesis and melena.  The various methods of treatment have been discussed with the patient and family. After consideration of risks, benefits and other options for treatment, the patient has consented to  Procedure(s): EGD (ESOPHAGOGASTRODUODENOSCOPY) (N/A) as a surgical intervention.  The patient's history has been reviewed, patient examined, no change in status, stable for surgery.  I have reviewed the patient's chart and labs.  Questions were answered to the patient's satisfaction.     [name]

## 2023-04-28 NOTE — Assessment & Plan Note (Signed)
 See above

## 2023-04-28 NOTE — Assessment & Plan Note (Signed)
 Appears euvolemic to dehydrated - Volume status with HD - Consult Nephrology

## 2023-04-28 NOTE — Progress Notes (Signed)
 Pt receives out-pt HD at [facility] on TTS. Will assist as needed.   Lauraine Polite Renal Navigator [phone]

## 2023-04-28 NOTE — Op Note (Signed)
 [facility] Patient Name: Anthony Bullock Procedure Date : 04/28/2023 MRN: [id number] Attending MD: Sergio Dandy , MD, [id number] Date of Birth: [birth year] CSN: [id number] Age: 63 Admit Type: Inpatient Procedure:                Upper GI endoscopy Indications:              Suspected upper gastrointestinal bleeding Providers:                Sergio Dandy, MD, Lorenzo Romberg, RN,                            Kimberly Penna Mbumina, Technician Referring MD:              Medicines:                Monitored Anesthesia Care Complications:            No immediate complications. Estimated Blood Loss:     Estimated blood loss was minimal. Procedure:                Pre-Anesthesia Assessment:                           - Prior to the procedure, a History and Physical                            was performed, and patient medications and                            allergies were reviewed. The patient's tolerance of                            previous anesthesia was also reviewed. The risks                            and benefits of the procedure and the sedation                            options and risks were discussed with the patient.                            All questions were answered, and informed consent                            was obtained. Prior Anticoagulants: The patient                            last took Eliquis  (apixaban ) 2 days and Plavix                             (clopidogrel ) 2 days prior to the procedure. ASA                            Grade Assessment: III - A patient with severe  systemic disease. After reviewing the risks and                            benefits, the patient was deemed in satisfactory                            condition to undergo the procedure.                           After obtaining informed consent, the endoscope was                            passed under direct vision. Throughout the                             procedure, the patient's blood pressure, pulse, and                            oxygen  saturations were monitored continuously. The                            [id number]-[id number] ([id number]) Olympus endoscope was introduced                            through the mouth, and advanced to the second part                            of duodenum. The upper GI endoscopy was                            accomplished without difficulty. The patient                            tolerated the procedure well. Scope In: Scope Out: Findings:      The Z-line was regular and was found 40 cm from the incisors.      No gross lesions were noted in the entire esophagus.      Patchy mild inflammation characterized by congestion (edema), erythema       and friability was found in the entire examined stomach. Biopsies were       taken with a cold forceps for Helicobacter pylori testing.      The cardia and gastric fundus were normal on retroflexion.      Few non-bleeding linear and superficial duodenal ulcers with no stigmata       of bleeding were found in the first portion of the duodenum and in the       second portion of the duodenum. The largest lesion was 10 mm in largest       dimension. Impression:               - Z-line regular, 40 cm from the incisors.                           - No gross lesions in the entire esophagus.                           -  Gastritis. Biopsied.                           - Non-bleeding duodenal ulcers with no stigmata of                            bleeding. Recommendation:           - Patient has a contact number available for                            emergencies. The signs and symptoms of potential                            delayed complications were discussed with the                            patient. Return to normal activities tomorrow.                            Written discharge instructions were provided to the                            patient.                           -  Resume previous diet.                           - Continue present medications.                           - Await pathology results.                           - No aspirin , ibuprofen, naproxen, or other                            non-steroidal anti-inflammatory drugs.                           - Use Protonix  (pantoprazole ) 40 mg PO BID.                           - Ok to resume Plavix  today and if Hgb remains                            stable restart Eliquis  tomorrow. Continue to hold                            ASA Procedure Code(s):        --- Professional ---                           [phone], Esophagogastroduodenoscopy, flexible,                            transoral;  with biopsy, single or multiple Diagnosis Code(s):        --- Professional ---                           K29.70, Gastritis, unspecified, without bleeding                           K26.9, Duodenal ulcer, unspecified as acute or                            chronic, without hemorrhage or perforation CPT copyright 2022 American Medical Association. All rights reserved. The codes documented in this report are preliminary and upon coder review may  be revised to meet current compliance requirements. Fleur Audino V. Mieka Leaton, MD 04/28/2023 1:43:05 PM This report has been signed electronically. Number of Addenda: 0

## 2023-04-28 NOTE — Assessment & Plan Note (Signed)
 New diagnosis. - Start oral vancomycin 

## 2023-04-28 NOTE — Telephone Encounter (Signed)
 Patient Product/process development scientist completed.    The patient is insured through [employer]. Patient has Medicare and is not eligible for a copay card, but may be able to apply for patient assistance or Medicare RX Payment Plan (Patient Must reach out to their plan, if eligible for payment plan), if available.    Ran test claim for vancomycin  125 mg and the current 10 day co-pay is $58.08.  Ran test claim for Dificid  200 mg mg and the current 10 day co-pay is $548.55  This test claim was processed through [employer]- copay amounts may vary at other pharmacies due to [employer], or as the patient moves through the different stages of their insurance plan.     Morgan Arab, CPHT Pharmacy Technician III Certified Patient Advocate [facility] Patient Advocate Team Direct Number: [phone]  Fax: [phone]

## 2023-04-28 NOTE — Progress Notes (Addendum)
 Progress Note   Patient: Anthony Bullock:[id number] DOB: [birth year] DOA: 04/27/2023     1 DOS: the patient was seen and examined on 04/28/2023 at 10:50AM      Brief hospital course: 63 y.o. M with ESRD on HD TThS, sCHF EF 40%, isch CM, HTN, HLD, pAF on Eliquis , DM, PVD s/p bilateral BKA, anemia and CAD with recent DES who presented with vomiting, diarrhea, and dark stools since discharge from his recent stents.  Found to have Hgb 5.4 g/dL, FOBT+, hypotension, and Cdiff.     Assessment and Plan: * Acute blood loss anemia Hgb 5.6 on admission from prior 10.6 two weeks ago.  Suspected UGIB in setting of triple therapy.  Transfused 4u so far. - Hold aspirin , Plavix  and Eliquis  - Consult GI and Cardiology - PPI IV - Transfuse to threshold 8 g/dL given coronary disease/recent stent    UGI bleed See above  C. difficile colitis New diagnosis. - Start oral vancomycin   Paroxysmal atrial fibrillation (HCC) In sinus - Hold Coreg  due to hypotension - Hold Eliquis  - Trend Hgb, if stable, resume Eliquis  tomorrow  Duodenal ulcer disease - Continue PPI - Avoid aspirin  and NSAIDs  Hyponatremia Due to hypovolemia - Trend Na  Elevated troponin Due to myocardial injury due to anemia.  Ischemia ruled out  Dyslipidemia - Continue Lipitor   Type 2 diabetes mellitus with peripheral neuropathy (HCC) With hyperglycemia - Continue Semglee  - SS correction insulin  ordered  S/P bilateral below knee amputation (HCC)    ESRD on dialysis (HCC) Intra-dialytic hypotension - Consult Nephrology for HD - Continue Renavit, Sevelamer , midodrine , Sensipar    Peripheral vascular disease (HCC) - Resume Plavix  today - Hold aspirin  indefinitely - Continue statin  CAD (coronary artery disease) S/p PCI two weeks ago, overlapping stents to RCA - Continue Lipitor  - Hold aspirin , Plavix  - Resume Plavix  as soon as able  Chronic systolic congestive heart failure (HCC) Appears euvolemic to  dehydrated - Volume status with HD - Consult Nephrology  COPD (chronic obstructive pulmonary disease) (HCC) No evidence of flare          Subjective: Patient tired, but feeling okay.  No BMs this mroning.  No fever.  No confusion.     Physical Exam: BP (!) 114/54 (BP Location: Right Arm)   Pulse (!) 103   Temp (!) 96.8 F (36 C) (Temporal)   Resp (!) 24   Ht 5\' 10"  (1.778 m)   Wt 89.4 kg   SpO2 92%   BMI 28.27 kg/m   Adult male, lying in bed, appears tired, but mentating well Tachycardic, regular, no murmurs, mild pitting edema in arms Bilateral BKA Respiratory rate normal, lungs clear without rales or wheezes Abdomen soft, no tenderness palpation or guarding, overall somewhat distended Attention normal, affect blunted but judgment and insight appear normal, oriented to person, place, time, face metric, generalized weakness    Data Reviewed: Discussed with GI and cardiology CBC shows hemoglobin up to 7.5 prior to last transfusion C. difficile positive Lactic acid 3.3, 2.3 White count up to 32,000 Sodium slightly low  Family Communication: Wife at the bedside    Disposition: Status is: Inpatient 63 yo M with ESRD admitted with GI bleeding, two weeks after new stent.  To EGD today, showed nonbleeding duodenal ulcers.  Plavix  resumed tonight.    If Hgb stable Saturday, can resume Eliquis           Author: Ephriam Hashimoto, MD 04/28/2023 4:48 PM  For on call review [email or web address].

## 2023-04-28 NOTE — Assessment & Plan Note (Signed)
 Intra-dialytic hypotension - Consult Nephrology for HD - Continue Renavit, Sevelamer , midodrine , Sensipar 

## 2023-04-28 NOTE — Assessment & Plan Note (Signed)
 S/p PCI two weeks ago, overlapping stents to RCA - Continue Lipitor  - Hold aspirin , Plavix  - Resume Plavix  as soon as able

## 2023-04-28 NOTE — Assessment & Plan Note (Signed)
 Due to myocardial injury due to anemia.  Ischemia ruled out

## 2023-04-28 NOTE — Assessment & Plan Note (Signed)
 With hyperglycemia - Continue Semglee  - SS correction insulin  ordered

## 2023-04-28 NOTE — ED Notes (Addendum)
 LR bolus in progress. Patient is a/o. Labs completed and sent. Will cont to monitor.

## 2023-04-28 NOTE — Assessment & Plan Note (Signed)
 Due to hypovolemia - Trend Na

## 2023-04-28 NOTE — ED Notes (Addendum)
 Updated provider on patient BP verbal order for 500 ml of LR. Admin as ordered will reassess

## 2023-04-28 NOTE — Anesthesia Preprocedure Evaluation (Addendum)
 Anesthesia Evaluation  Patient identified by MRN, date of birth, ID band Patient awake    Reviewed: Allergy & Precautions, NPO status , Patient's Chart, lab work & pertinent test results  Airway Mallampati: II

## 2023-04-28 NOTE — Assessment & Plan Note (Signed)
-   Resume Plavix  today - Hold aspirin  indefinitely - Continue statin

## 2023-04-28 NOTE — Progress Notes (Signed)
 [facility] Progress Note  Subjective:    Vitals:   04/28/23 1410 04/28/23 1415 04/28/23 1420 04/28/23 1500  BP: (!) 109/56  (!) 117/55 (!) 114/54  Pulse: 97 98 99 (!) 103  Resp: (!) 27 (!) 31 (!) 27 (!) 24  Temp:      TempSrc:      SpO2: 98% 95% 94% 92%  Weight:      Height:        Exam: Gen alert, no distress Sclera anicteric, throat clear  No jvd or bruits Chest clear bilat to bases RRR no MRG Abd soft ntnd no mass or ascites +bs GU normal male MS bilateral BKA Ext no LE or UE edema Neuro is alert, Ox 3 , nf    RIJ TDC intact        Renal-related home meds: Coreg  12.5 Mg twice daily Sensipar  30 mg TTS Sevelamer  2 AC 3 times daily Midodrine  10 mg before HD TTS Rena-Vite once daily Others: Neurontin , Lipitor , insulin , Plavix , aspirin , Eliquis       OP HD:  [facility] From 04/14/23 --> 3.5h  B400   85.5kg  1K bath  TDC   Heparin  none       Assessment/ Plan: GI bleed: w/ melena, N/V, Hb 5.6. S/P prbc's, GI consulting.  ESRD: on HD TTS. Has not missed HD. HD tomorrow.  BP: chronic hypotension on midodrine  pre HD. BP 90s in ED, not eating, no vol excess. Keep even w/ HD.  Volume: as above, euvolemic to dry on exam Anemia of esrd: Hb low, getting prbc's, and GI work-up.  Secondary hyperparathyroidism: CCa in range, cont binders w/ meals, add on phos.  PAD sp bilat BKA       Myer Fret MD  [facility] 04/28/2023, 6:04 PM  Recent Labs  Lab 04/27/23 0404 04/27/23 2116 04/28/23 0639 04/28/23 0640 04/28/23 1117  HGB 5.6*   < >  --  6.4* 7.5*  ALBUMIN  2.6*  --  2.3*  --   --   CALCIUM  8.3*  --  8.1*  --   --   PHOS  --   --  5.6*  --   --   CREATININE 8.20*  --  5.93*  --   --   K 3.5  --  4.2  --   --    < > = values in this interval not displayed.   No results for input(s): IRON, TIBC, FERRITIN in the last 168 hours. Inpatient medications:  sodium chloride    Intravenous Once   sodium chloride    Intravenous Once   atorvastatin    80 mg Oral QPM   Chlorhexidine  Gluconate Cloth  6 each Topical Daily   Chlorhexidine  Gluconate Cloth  6 each Topical Q0600   [START ON 04/29/2023] cinacalcet   30 mg Oral Q T,Th,Sa-HD   clopidogrel   75 mg Oral Daily   gabapentin   300 mg Oral QHS   insulin  aspart  0-15 Units Subcutaneous TID WC   insulin  glargine-yfgn  14 Units Subcutaneous Daily   midodrine   10 mg Oral Once per day on Monday Wednesday Friday   multivitamin  1 tablet Oral QHS   pantoprazole  (PROTONIX ) IV  40 mg Intravenous Q6H   Followed by   NOREEN ON 04/30/2023] pantoprazole  (PROTONIX ) IV  40 mg Intravenous Q12H   [START ON 04/29/2023] sevelamer  carbonate  1,600 mg Oral TID WC   sodium chloride  flush  3 mL Intravenous Q12H   vancomycin   125 mg Oral TID AC &  HS    acetaminophen  **OR** acetaminophen , albuterol , ondansetron  **OR** ondansetron  (ZOFRAN ) IV, sodium chloride  flush

## 2023-04-28 NOTE — Assessment & Plan Note (Signed)
 -  Continue Lipitor

## 2023-04-28 NOTE — ED Notes (Signed)
Provider updated on patient BP.

## 2023-04-28 NOTE — Assessment & Plan Note (Addendum)
 Hgb 5.6 on admission from prior 10.6 two weeks ago.  Suspected UGIB in setting of triple therapy.  Transfused 4u so far. - Hold aspirin , Plavix  and Eliquis  - Consult GI and Cardiology - PPI IV - Transfuse to threshold 8 g/dL given coronary disease/recent stent

## 2023-04-28 NOTE — Assessment & Plan Note (Signed)
-   Continue PPI - Avoid aspirin  and NSAIDs

## 2023-04-28 NOTE — Assessment & Plan Note (Signed)
 In sinus - Hold Coreg  due to hypotension - Hold Eliquis  - Trend Hgb, if stable, resume Eliquis  tomorrow

## 2023-04-28 NOTE — Hospital Course (Addendum)
 63 y.o. M with ESRD on HD TThS, sCHF EF 40%, isch CM, HTN, HLD, pAF on Eliquis , DM, PVD s/p bilateral BKA, anemia and CAD with recent DES who presented with vomiting, diarrhea, and dark stools since discharge from his recent stents.  Found to have Hgb 5.4 g/dL, FOBT+, hypotension, and Cdiff.

## 2023-04-28 NOTE — Consult Note (Signed)
 Cardiology Consultation   Patient ID: Anthony Bullock MRN: [id number]; DOB: May 23, 1960  Admit date: 04/27/2023 Date of Consult: 04/28/2023  PCP:  Marquetta Sit, MD   [facility] Providers Cardiologist:  Armida Lander, MD  Electrophysiologist:  Richardo Chandler, MD  {   Patient Profile:   Anthony Bullock is a 63 y.o. male with a hx of recent NSTEMI with DES to LAD/RCA, heart failure with mildly reduced EF, LBBB, mildly ICD 2015 left in place after battery ran out, paroxysmal atrial fibrillation, hyperlipidemia, hypertension, PVD status post bilateral BKA 2018, osteomyelitis, prior gangrenous wound infection, ESRD on HD, GERD, who is being seen 04/28/2023 to discuss blood thinners in the setting of GI bleed  History of Present Illness:   Anthony Bullock has recent admission April 2025, treated for NSTEMI.  Cardiac catheterization showing multivessel disease.  He had successful bifurcation stenting of the LAD/first diagonal and then staged stents placed to RCA x 2.  He was placed on triple therapy for 1 month due to history of paroxysmal atrial fibrillation.  In terms of his heart failure his LVEF as low as 25% and 2015 but cardiac catheterization at that time demonstrated nonobstructive disease.  EF has been variable over the years but more recently has been mildly reduced based off most recent echocardiogram in April 2025.  Also has history of paroxysmal atrial fibrillation since 08/2022.  Seems to have few episodes of this in the past in the setting of acute issues however overall maintains NSR.    Currently patient being admitted for concerns of upper GI bleed, melena now requiring blood transfusions.  Also has C. difficile.  Tentative plans have been for EGD cardiology was consulted to weigh in on blood thinners given he is on triple therapy with fresh stents.  Currently being transfused with blood.  During this admission patient was doing fairly fine with plans of EGD.  Today he has  had worsening hemoglobin 5.6-7 0.2-6.4.  He reports that he has had signs of melena since his cardiac catheterization.  He has a lactic acid of 3.3.  Pressures have been a little bit soft so now he is getting IVF and started on midodrine .  Normally takes is only on dialysis days.  White count is also elevated at 32.3 with no obvious signs of infection, so question whether this is reactive.  Sodium 130.  Potassium 4.2.  Creatinine 5.93.  Uptrending WBC 32.3.  Chest x-ray negative.  Otherwise patient without any other complaints.  Not endorsing shortness of breath or chest pain.  At the bedside he has stable blood pressure around 100 systolic.  Talking comfortably and in no acute distress.  Past Medical History:  Diagnosis Date   AICD (automatic cardioverter/defibrillator) present    [employer]   Allergic rhinitis    Anemia    Arthritis    Chronic systolic heart failure (HCC)    a. ECHO (12/2012) EF 25-30%, HK entireanteroseptal myocardium //  b.  EF 25%, diffuse HK, grade 1 diastolic dysfunction, MAC, mild LAE, normal RVSF, trivial pericardial effusion   COPD (chronic obstructive pulmonary disease) (HCC)    Diabetes mellitus type II    Diabetic nephropathy (HCC)    Diabetic neuropathy (HCC)    ESRD on hemodialysis (HCC)    started HD June 2017, goes to [facility] HD unit, Dr Lieutenant Reese   History of cardiac catheterization    a.Myoview  1/15:  There is significant left ventricular dysfunction. There may be slight scar  at the apex. There is no significant ischemia. LV Ejection Fraction: 27%  //  b. RHC/LHC (1/15) with mean RA 6, PA 47/22 mean 33, mean PCWP 20, PVR 2.5 WU, CI 2.5; 80% dLAD stenosis, 70% diffuse large D.     History of kidney stones    Hyperlipidemia    Hypertension    Kidney stones    NICM (nonischemic cardiomyopathy) (HCC)    Primarily nonischemic.  Echo (12/14) with EF 25-30%.  Echo (3/15) with EF 25%, mild to moderately dilated LV, normal RV size and systolic function.      Osteomyelitis (HCC)    left fifth ray   Pneumonia    Urethral stricture    Wears glasses     Past Surgical History:  Procedure Laterality Date   ABDOMINAL AORTOGRAM W/LOWER EXTREMITY N/A 03/30/2016   Procedure: Abdominal Aortogram w/Lower Extremity;  Surgeon: Dannis Dy, MD;  Location: [facility] INVASIVE CV LAB;  Service: Cardiovascular;  Laterality: N/A;   AMPUTATION Right 04/26/2016   Procedure: Right Below Knee Amputation;  Surgeon: Timothy Ford, MD;  Location: [facility] OR;  Service: Orthopedics;  Laterality: Right;   AMPUTATION Left 08/21/2019   Procedure: LEFT FOOT 5TH RAY AMPUTATION;  Surgeon: Timothy Ford, MD;  Location: [facility] OR;  Service: Orthopedics;  Laterality: Left;   AMPUTATION Left 11/13/2019   Procedure: LEFT BELOW KNEE AMPUTATION;  Surgeon: Timothy Ford, MD;  Location: [facility] OR;  Service: Orthopedics;  Laterality: Left;   AV FISTULA PLACEMENT Right 09/08/2015   Procedure: INSERTION OF 4-44mm x 45cm  ARTERIOVENOUS (AV) GORE-TEX GRAFT RIGHT UPPER  ARM;  Surgeon: Dannis Dy, MD;  Location: [facility] OR;  Service: Vascular;  Laterality: Right;   AV FISTULA PLACEMENT Left 01/14/2016   Procedure: CREATION OF LEFT UPPER ARM ARTERIOVENOUS FISTULA;  Surgeon: Dannis Dy, MD;  Location: [facility] OR;  Service: Vascular;  Laterality: Left;   BASCILIC VEIN TRANSPOSITION Right 08/22/2014   Procedure: RIGHT UPPER ARM BASCILIC VEIN TRANSPOSITION;  Surgeon: Dannis Dy, MD;  Location: [facility] OR;  Service: Vascular;  Laterality: Right;   BELOW KNEE LEG AMPUTATION Right 04/26/2016   CARDIAC CATHETERIZATION     CARDIAC DEFIBRILLATOR PLACEMENT  06/27/2013   Sub Q       BY DR Rodolfo Clan   CATARACT EXTRACTION W/PHACO Right 08/06/2018   Procedure: CATARACT EXTRACTION PHACO AND INTRAOCULAR LENS PLACEMENT (IOC);  Surgeon: Tarri Farm, MD;  Location: [facility] ORS;  Service: Ophthalmology;  Laterality: Right;  CDE: 4.06   CATARACT EXTRACTION W/PHACO Left 08/20/2018   Procedure: CATARACT EXTRACTION  PHACO AND INTRAOCULAR LENS PLACEMENT (IOC);  Surgeon: Tarri Farm, MD;  Location: [facility] ORS;  Service: Ophthalmology;  Laterality: Left;  CDE: 6.76   COLONOSCOPY WITH PROPOFOL  N/A 07/22/2015   Procedure: COLONOSCOPY WITH PROPOFOL ;  Surgeon: Albertina Hugger, MD;  Location: [facility] END[facility];  Service: Gastroenterology;  Laterality: N/A;   CORONARY LITHOTRIPSY N/A 04/14/2023   Procedure: CORONARY LITHOTRIPSY;  Surgeon: Arleen Lacer, MD;  Location: [facility] INVASIVE CV LAB;  Service: Cardiovascular;  Laterality: N/A;  RCA   CORONARY STENT INTERVENTION N/A 04/12/2023   Procedure: CORONARY STENT INTERVENTION;  Surgeon: Swaziland, Peter M, MD;  Location: [facility] INVASIVE CV LAB;  Service: Cardiovascular;  Laterality: N/A;   CORONARY STENT INTERVENTION N/A 04/14/2023   Procedure: CORONARY STENT INTERVENTION;  Surgeon: Arleen Lacer, MD;  Location: [facility] INVASIVE CV LAB;  Service: Cardiovascular;  Laterality: N/A;   FEMORAL-POPLITEAL BYPASS GRAFT Right 03/31/2016   Procedure: BYPASS GRAFT FEMORAL-POPLITEAL  ARTERY USING RIGHT GREATER SAPHENOUS NONREVERSED VEIN;  Surgeon: Dannis Dy, MD;  Location: [facility] OR;  Service: Vascular;  Laterality: Right;   HERNIA REPAIR     I & D EXTREMITY Right 03/31/2016   Procedure: IRRIGATION AND DEBRIDEMENT FOOT;  Surgeon: Dannis Dy, MD;  Location: [facility] OR;  Service: Vascular;  Laterality: Right;   IMPLANTABLE CARDIOVERTER DEFIBRILLATOR IMPLANT N/A 06/27/2013   Procedure: SUB Q ICD;  Surgeon: Verona Goodwill, MD;  Location: [facility] CATH LAB;  Service: Cardiovascular;  Laterality: N/A;   INTRAOPERATIVE ARTERIOGRAM Right 03/31/2016   Procedure: INTRA OPERATIVE ARTERIOGRAM;  Surgeon: Dannis Dy, MD;  Location: [facility] OR;  Service: Vascular;  Laterality: Right;   IR GENERIC HISTORICAL Right 11/30/2015   IR THROMBECTOMY AV FISTULA W/THROMBOLYSIS/PTA INC/SHUNT/IMG RIGHT 11/30/2015 Erica Hau, MD [facility]   IR GENERIC HISTORICAL  11/30/2015   IR US  GUIDE VASC ACCESS RIGHT  11/30/2015 Erica Hau, MD [facility]   IR GENERIC HISTORICAL Right 12/15/2015   IR THROMBECTOMY AV FISTULA W/THROMBOLYSIS/PTA/STENT INC/SHUNT/IMG RT 12/15/2015 Marland Silvas, MD [facility]   IR GENERIC HISTORICAL  12/15/2015   IR US  GUIDE VASC ACCESS RIGHT 12/15/2015 Marland Silvas, MD [facility]   IR GENERIC HISTORICAL  12/28/2015   IR FLUORO GUIDE CV LINE RIGHT 12/28/2015 Artice Last, MD [facility]   IR GENERIC HISTORICAL  12/28/2015   IR US  GUIDE VASC ACCESS RIGHT 12/28/2015 Artice Last, MD [facility]   LEFT A ND RIGHT HEART CATH  01/30/2013   DR Dasie Epps   LEFT AND RIGHT HEART CATHETERIZATION WITH CORONARY ANGIOGRAM N/A 01/30/2013   Procedure: LEFT AND RIGHT HEART CATHETERIZATION WITH CORONARY ANGIOGRAM;  Surgeon: Mardell Shade, MD;  Location: [facility] CATH LAB;  Service: Cardiovascular;  Laterality: N/A;   LEFT HEART CATH AND CORONARY ANGIOGRAPHY N/A 04/12/2023   Procedure: LEFT HEART CATH AND CORONARY ANGIOGRAPHY;  Surgeon: Swaziland, Peter M, MD;  Location: [facility] INVASIVE CV LAB;  Service: Cardiovascular;  Laterality: N/A;   PERIPHERAL VASCULAR CATHETERIZATION Right 01/26/2015   Procedure: A/V Fistulagram;  Surgeon: Dannis Dy, MD;  Location: [facility] INVASIVE CV LAB;  Service: Cardiovascular;  Laterality: Right;   reapea urethral surgery for recurrent obstruction  2011   TOTAL KNEE ARTHROPLASTY Right 2007   VEIN HARVEST Right 03/31/2016   Procedure: RIGHT GREATER SAPHENOUS VEIN HARVEST;  Surgeon: Dannis Dy, MD;  Location: [facility] OR;  Service: Vascular;  Laterality: Right;     Inpatient Medications: Scheduled Meds:  sodium chloride    Intravenous Once   atorvastatin   80 mg Oral QPM   Chlorhexidine  Gluconate Cloth  6 each Topical Daily   Chlorhexidine  Gluconate Cloth  6 each Topical Q0600   gabapentin   300 mg Oral QHS   insulin  aspart  0-15 Units Subcutaneous TID WC   insulin  glargine-yfgn  14 Units Subcutaneous Daily   midodrine   10 mg Oral Once per day on  Monday Wednesday Friday   multivitamin  1 tablet Oral QHS   pantoprazole  (PROTONIX ) IV  40 mg Intravenous Q6H   Followed by   Cecily Cohen ON 04/30/2023] pantoprazole  (PROTONIX ) IV  40 mg Intravenous Q12H   sodium chloride  flush  3 mL Intravenous Q12H   vancomycin   125 mg Oral TID AC & HS   Continuous Infusions:  PRN Meds: acetaminophen  **OR** acetaminophen , albuterol , ondansetron  **OR** ondansetron  (ZOFRAN ) IV, sodium chloride  flush  Allergies:    Allergies  Allergen Reactions   Epoetin  Alfa Other (See Comments)    Unknown    Ferumoxytol   Other (See Comments)    Unknown    Morphine Sulfate Rash and Other (See Comments)    Itches all over, red spots    Social History:   Social History   Socioeconomic History   Marital status: Married    Spouse name: Not on file   Number of children: 0   Years of education: Not on file   Highest education level: Not on file  Occupational History   Not on file  Tobacco Use   Smoking status: Former    Current packs/day: 0.00    Average packs/day: 2.0 packs/day for 32.0 years (64.0 ttl pk-yrs)    Types: Cigarettes    Start date: 05/11/1978    Quit date: 05/11/2010    Years since quitting: 12.9   Smokeless tobacco: Never  Vaping Use   Vaping status: Never Used  Substance and Sexual Activity   Alcohol  use: No   Drug use: No   Sexual activity: Yes  Other Topics Concern   Not on file  Social History Narrative   Works at [employer] as a Therapist, music      Right handed   Wears glasses    Drinks 10 oz coffee daily   Drinks 2 sodas per day      Pt disabled    Lives with wife    Social Drivers of Corporate investment banker Strain: Low Risk  (02/03/2023)   Overall Financial Resource Strain (CARDIA)    Difficulty of Paying Living Expenses: Not hard at all  Food Insecurity: No Food Insecurity (04/27/2023)   Hunger Vital Sign    Worried About Running Out of Food in the Last Year: Never true    Ran Out of Food in the Last Year: Never true   Transportation Needs: No Transportation Needs (04/27/2023)   PRAPARE - Administrator, Civil Service (Medical): No    Lack of Transportation (Non-Medical): No  Physical Activity: Inactive (02/03/2023)   Exercise Vital Sign    Days of Exercise per Week: 0 days    Minutes of Exercise per Session: 0 min  Stress: No Stress Concern Present (02/03/2023)   [employer] of Occupational Health - Occupational Stress Questionnaire    Feeling of Stress : Not at all  Social Connections: Socially Integrated (04/27/2023)   Social Connection and Isolation Panel [NHANES]    Frequency of Communication with Friends and Family: More than three times a week    Frequency of Social Gatherings with Friends and Family: More than three times a week    Attends Religious Services: More than 4 times per year    Active Member of [employer] or Organizations: Yes    Attends Engineer, structural: More than 4 times per year    Marital Status: Married  Catering manager Violence: Not At Risk (04/27/2023)   Humiliation, Afraid, Rape, and Kick questionnaire    Fear of Current or Ex-Partner: No    Emotionally Abused: No    Physically Abused: No    Sexually Abused: No    Family History:   Family History  Problem Relation Age of Onset   Bladder Cancer Mother    Alcohol  abuse Father    Melanoma Father    Stroke Maternal Grandmother    Heart Problems Maternal Grandmother        unknown   Diabetes Maternal Grandmother    Heart disease Maternal Grandfather    Prostate cancer Maternal Grandfather      ROS:  Please  see the history of present illness.  All other ROS reviewed and negative.     Physical Exam/Data:   Vitals:   04/28/23 0800 04/28/23 0817 04/28/23 0900 04/28/23 0945  BP: (!) 85/39 (!) 86/42 (!) 96/53 (!) 89/41  Pulse: 98 99 (!) 113 (!) 101  Resp: (!) 25 (!) 22 (!) 25 (!) 21  Temp: 98.4 F (36.9 C) 97.8 F (36.6 C)    TempSrc: Oral Oral    SpO2: 100% 100% 100% 100%  Weight:       Height:        Intake/Output Summary (Last 24 hours) at 04/28/2023 1108 Last data filed at 04/28/2023 0655 Gross per 24 hour  Intake 1288.75 ml  Output -300 ml  Net 1588.75 ml      04/27/2023    2:27 PM 04/27/2023    3:47 AM 04/24/2023    8:26 AM  Last 3 Weights  Weight (lbs) -- 197 lb 197 lb  Weight (kg) -- 89.359 kg 89.359 kg     Body mass index is 28.27 kg/m.  General:  Well nourished, well developed, in no acute distress HEENT: normal Neck: no JVD Vascular: No carotid bruits; Distal pulses 2+ bilaterally Cardiac:  normal S1, S2; RRR; no murmur  Lungs:  clear to auscultation bilaterally, no wheezing, rhonchi or rales  Abd: soft, nontender, no hepatomegaly  Ext: no edema Musculoskeletal:  No deformities, BUE and BLE strength normal and equal Skin: warm and dry  Neuro:  CNs 2-12 intact, no focal abnormalities noted Psych:  Normal affect   EKG:  The EKG was personally reviewed and demonstrates: Sinus rhythm heart rate 101.  No acute ST-T wave changes. Telemetry:  Telemetry was personally reviewed and demonstrates: Sinus tachycardia heart rates in the low 100s  Relevant CV Studies: Cardiac catheterization 04/30/2023 staged procedure  LESION #1: Prox RCA to Mid RCA lesion is 80% stenosed.   Scoring balloon angioplasty was performed using a BALLN SCOREFLEX 3.50X15. Followed by shockwave lithotripsy with a 3.5 mm 12 mm shockwave balloon   A drug-eluting stent was successfully placed using a SYNERGY XD 3.50X28 => postdilated in the majority of stent to 3.8-3.9 mm. Post intervention, there is a 5% residual stenosis in the tightest segment, but the remainder of the stent is 0% stenosed.  TIMI-3 flow maintained   --------------------------------------------   LESION #2 Ost RCA lesion is 80% stenosed.   Scoring balloon angioplasty was performed using a BALLN SCOREFLEX 3.50X15.  Followed by shockwave lithotripsy with a 3.5 mm 12 mm shockwave balloon   A drug-eluting stent was  successfully placed using a SYNERGY XD 3.50X16 postdilated from 4.2 to 4.0 mm.  Post intervention, there is a 0% residual stenosis.  TIMI-3 flow maintained   Diagnostic  Dominance: Right                                                   Intervention  Successful 2 site PCI of the RCA using score flex angioplasty followed by shockwave lithotripsy for lesion modification: Mid RCA 80% reduced to 0% with exception of focal 5% (Synergy XD 3.5 mm x 28 mm postdilated to 3.8 mm); ostial RCA 80% reduced to 0% (Synergy XD 3.5 mm 16mm postdilated in tapered fashion from 4.2 to 3.9 mm)    RECOMMENDATIONS   In the absence of any other complications or  medical issues, we expect the patient to be ready for discharge from an interventional cardiology perspective on 04/15/2023.   Continue to titrate GDMT   Recommend dual antiplatelet therapy with Aspirin  81mg  daily and Clopidogrel  75mg  daily long-term (beyond 12 months) because of Extensive PCI to both the LAD and RCA.   Would continue DAPT for 1 year and then after 1 year would continue long-term clopidogrel   Left heart catheterization 04/12/2023  Ost RCA lesion is 80% stenosed.   Prox RCA to Mid RCA lesion is 80% stenosed.   Ost LM lesion is 45% stenosed.   Prox LAD to Mid LAD lesion is 85% stenosed.   1st Diag lesion is 95% stenosed.   1st Mrg lesion is 100% stenosed.   Dist Cx-1 lesion is 85% stenosed.   Dist Cx-2 lesion is 100% stenosed.   A drug-eluting stent was successfully placed using a SYNERGY XD 2.50X12 in the side branch.   A drug-eluting stent was successfully placed using a SYNERGY XD 2.50X32 in the main branch.   Angioplasty was performed in the main branch. .   Angioplasty was performed in the side branch. .   Post intervention, there is a 0% residual stenosis.   Post intervention, there is a 0% residual stenosis.   LV end diastolic pressure is normal.   Recommend to resume Apixaban , at currently prescribed dose and frequency.   Recommend  concurrent antiplatelet therapy of Aspirin  81 mg for 1 month and Clopidogrel  75mg  daily for 12 months .   Severe 3 vessel obstructive CAD. Suspect culprit vessel is the first OM which is occluded Normal LVEDP Successful bifurcation stenting of the LAD/first diagonal with 2.5 x 12 mm Synergy stent in the diagonal and 2.5 x 32 mm Synergy stent in the LAD   Plan: DAPT with ASA 81 mg daily for one month. Plavix  75 mg daily for 12 months for ACS. Anticipate resuming Eliquis  once procedures are completed. Recommend return for staged PCI of the RCA at the ostium and mid vessel. Would treat LCx disease medically.    Echocardiogram 04/11/2023 1. Left ventricular ejection fraction, by estimation, is 40%. The left  ventricle has mildly decreased function. The left ventricle demonstrates  global hypokinesis. There is moderate left ventricular hypertrophy. Left  ventricular diastolic parameters are   consistent with Grade I diastolic dysfunction (impaired relaxation).  Elevated left atrial pressure.   2. Right ventricular systolic function is normal. The right ventricular  size is normal.   3. The mitral valve is normal in structure. No evidence of mitral valve  regurgitation. No evidence of mitral stenosis.   4. The aortic valve is tricuspid. Aortic valve regurgitation is not  visualized. No aortic stenosis is present.   5. The inferior vena cava is normal in size with greater than 50%  respiratory variability, suggesting right atrial pressure of 3 mmHg.   Laboratory Data:  High Sensitivity Troponin:   Recent Labs  Lab 04/11/23 0933 04/12/23 0656 04/12/23 0817 04/27/23 0404 04/27/23 0657  TROPONINIHS 16,311* 12,078* 11,346* 345* 387*     Chemistry Recent Labs  Lab 04/27/23 0404 04/28/23 0639  NA 130* 130*  K 3.5 4.2  CL 91* 91*  CO2 19* 18*  GLUCOSE 197* 273*  BUN 95* 55*  CREATININE 8.20* 5.93*  CALCIUM  8.3* 8.1*  GFRNONAA 7* 10*  ANIONGAP 20* 21*    Recent Labs  Lab  04/27/23 0404 04/28/23 0639  PROT 5.9*  --   ALBUMIN  2.6* 2.3*  AST 28  --  ALT 22  --   ALKPHOS 62  --   BILITOT 0.4  --    Lipids No results for input(s): "CHOL", "TRIG", "HDL", "LABVLDL", "LDLCALC", "CHOLHDL" in the last 168 hours.  Hematology Recent Labs  Lab 04/27/23 0404 04/27/23 2116 04/28/23 0640  WBC 21.0* 35.6* 32.3*  RBC 1.87* 2.34* 2.11*  HGB 5.6* 7.2* 6.4*  HCT 17.4* 21.3* 19.4*  MCV 93.0 91.0 91.9  MCH 29.9 30.8 30.3  MCHC 32.2 33.8 33.0  RDW 15.4 16.0* 16.3*  PLT 230 218 210   Thyroid  No results for input(s): "TSH", "FREET4" in the last 168 hours.  BNPNo results for input(s): "BNP", "PROBNP" in the last 168 hours.  DDimer No results for input(s): "DDIMER" in the last 168 hours.   Radiology/Studies:  No results found.   Assessment and Plan:   Acute upper GI bleed  In the setting of triple therapy with recent stents.  Reports that he has been having melena since his cardiac catheterization.  He has required blood transfusions and actively getting transfused now.  Clinically does not look like he is in shock but lactic acid is 3.3.  Hemoglobin 6.4.  Pressures are somewhat soft.  But he is warm and well-perfused.   Tentative plans are for EGD once able. Discussed with MD, but need to coordinate with GI as well. Our preference would be to perform EGD as soon as possible to rule out bleed.  If felt to be stable would trial him on heparin .   If no active bleed and stable, ideally would be on Eliquis  and Plavix .   Obviously he is at risk for in-stent thrombosis, could consider heparin  and IV cangrelor if suspicion was high enough and bleeding risk thought to be reasonable. Defer to MD.  Recent NSTEMI Cardiac catheterization with three-vessel CAD.  He had successful bifurcation stenting of the LAD/first diagonal, staged PCI of RCA x 2.  No angina. See above about blood thinners. Continue with atorvastatin  80 mg.  No beta-blocker with soft pressures/shock. LDL  7  Chronic HFmrEF EF has been variable throughout the years and as low as 20 to 25% in 2015 however has been more stable with EF 40% in April 2025 with normal RV.  He is euvolemic, well-perfused. GDMT limited by soft pressures and ESRD.  Hold off for the time being. Volume status managed by dialysis  Paroxysmal atrial fibrillation Used to be an issue in 2024, throughout this year seems to be maintaining NSR. See above about Eliquis   ESRD on HD  Type 2 diabetes A1c 6.6%  Leukocytosis Defer to primary team, has uptrending WBC up to 32.  No obvious signs of infection.   Risk Assessment/Risk Scores:   New York  Heart Association (NYHA) Functional Class NYHA Class II  CHA2DS2-VASc Score = 4   This indicates a 4.8% annual risk of stroke. The patient's score is based upon: CHF History: 1 HTN History: 1 Diabetes History: 1 Stroke History: 0 Vascular Disease History: 1 Age Score: 0 Gender Score: 0   For questions or updates, please contact [facility] Please consult [email or web address] for contact info under    Signed, Burnetta Cart, PA-C  04/28/2023 11:08 AM

## 2023-04-28 NOTE — Transfer of Care (Signed)
 Immediate Anesthesia Transfer of Care Note  Patient: Anthony Bullock  Procedure(s) Performed: EGD (ESOPHAGOGASTRODUODENOSCOPY) BIOPSY, SKIN, SUBCUTANEOUS TISSUE, OR MUCOUS MEMBRANE  Patient Location: PACU  Anesthesia Type:MAC  Level of Consciousness: awake and sedated  Airway & Oxygen  Therapy: Patient Spontanous Breathing and Patient connected to nasal cannula oxygen   Post-op Assessment: Report given to RN and Post -op Vital signs reviewed and stable  Post vital signs: Reviewed and stable  Last Vitals:  Vitals Value Taken Time  BP 89/46 04/28/23 1350  Temp    Pulse 86 04/28/23 1354  Resp 29 04/28/23 1354  SpO2 96 % 04/28/23 1354  Vitals shown include unfiled device data.  Last Pain:  Vitals:   04/28/23 1350  TempSrc:   PainSc: 0-No pain         Complications: No notable events documented.

## 2023-04-28 NOTE — Assessment & Plan Note (Signed)
No evidence of flare 

## 2023-04-28 NOTE — Progress Notes (Signed)
 Reviewed EGD note. Non-bleeding duodenal ulcers noted. Per GI, okay to resume Plavixc today if Hb remains stable and ELioquis tomorrow. At this point, I would omit Aspirin  completely, going forward.  Cody Das, MD

## 2023-04-29 DIAGNOSIS — Z7902 Long term (current) use of antithrombotics/antiplatelets: Secondary | ICD-10-CM

## 2023-04-29 DIAGNOSIS — I739 Peripheral vascular disease, unspecified: Secondary | ICD-10-CM | POA: Diagnosis not present

## 2023-04-29 DIAGNOSIS — D62 Acute posthemorrhagic anemia: Secondary | ICD-10-CM | POA: Diagnosis not present

## 2023-04-29 DIAGNOSIS — Z955 Presence of coronary angioplasty implant and graft: Secondary | ICD-10-CM

## 2023-04-29 DIAGNOSIS — E7849 Other hyperlipidemia: Secondary | ICD-10-CM | POA: Diagnosis not present

## 2023-04-29 DIAGNOSIS — K921 Melena: Secondary | ICD-10-CM | POA: Diagnosis not present

## 2023-04-29 DIAGNOSIS — K269 Duodenal ulcer, unspecified as acute or chronic, without hemorrhage or perforation: Secondary | ICD-10-CM | POA: Diagnosis not present

## 2023-04-29 DIAGNOSIS — Z992 Dependence on renal dialysis: Secondary | ICD-10-CM | POA: Diagnosis not present

## 2023-04-29 DIAGNOSIS — N186 End stage renal disease: Secondary | ICD-10-CM | POA: Diagnosis not present

## 2023-04-29 LAB — BPAM RBC
Blood Product Expiration Date: 202505092359
Blood Product Expiration Date: 202505122359
Blood Product Expiration Date: 202505132359
Blood Product Expiration Date: 202505232359
ISSUE DATE / TIME: 202504170917
ISSUE DATE / TIME: 202504171207
ISSUE DATE / TIME: 202504180751
ISSUE DATE / TIME: 202504181231
Unit Type and Rh: 1700
Unit Type and Rh: 9500
Unit Type and Rh: 9500
Unit Type and Rh: 9500

## 2023-04-29 LAB — CBC WITH DIFFERENTIAL/PLATELET
Abs Immature Granulocytes: 0.09 10*3/uL — ABNORMAL HIGH (ref 0.00–0.07)
Basophils Absolute: 0.1 10*3/uL (ref 0.0–0.1)
Basophils Relative: 1 %
Eosinophils Absolute: 0.1 10*3/uL (ref 0.0–0.5)
Eosinophils Relative: 0 %
HCT: 25 % — ABNORMAL LOW (ref 39.0–52.0)
Hemoglobin: 8.4 g/dL — ABNORMAL LOW (ref 13.0–17.0)
Immature Granulocytes: 1 %
Lymphocytes Relative: 5 %
Lymphs Abs: 0.9 10*3/uL (ref 0.7–4.0)
MCH: 30.1 pg (ref 26.0–34.0)
MCHC: 33.6 g/dL (ref 30.0–36.0)
MCV: 89.6 fL (ref 80.0–100.0)
Monocytes Absolute: 0.4 10*3/uL (ref 0.1–1.0)
Monocytes Relative: 2 %
Neutro Abs: 16.2 10*3/uL — ABNORMAL HIGH (ref 1.7–7.7)
Neutrophils Relative %: 91 %
Platelets: 185 10*3/uL (ref 150–400)
RBC: 2.79 MIL/uL — ABNORMAL LOW (ref 4.22–5.81)
RDW: 16 % — ABNORMAL HIGH (ref 11.5–15.5)
WBC: 17.7 10*3/uL — ABNORMAL HIGH (ref 4.0–10.5)
nRBC: 0 % (ref 0.0–0.2)

## 2023-04-29 LAB — TYPE AND SCREEN
ABO/RH(D): B NEG
Antibody Screen: NEGATIVE
Unit division: 0
Unit division: 0
Unit division: 0
Unit division: 0

## 2023-04-29 LAB — CBC
HCT: 25.3 % — ABNORMAL LOW (ref 39.0–52.0)
Hemoglobin: 8.7 g/dL — ABNORMAL LOW (ref 13.0–17.0)
MCH: 30.3 pg (ref 26.0–34.0)
MCHC: 34.4 g/dL (ref 30.0–36.0)
MCV: 88.2 fL (ref 80.0–100.0)
Platelets: 192 10*3/uL (ref 150–400)
RBC: 2.87 MIL/uL — ABNORMAL LOW (ref 4.22–5.81)
RDW: 15.8 % — ABNORMAL HIGH (ref 11.5–15.5)
WBC: 26.7 10*3/uL — ABNORMAL HIGH (ref 4.0–10.5)
nRBC: 0.2 % (ref 0.0–0.2)

## 2023-04-29 LAB — RENAL FUNCTION PANEL
Albumin: 2.1 g/dL — ABNORMAL LOW (ref 3.5–5.0)
Anion gap: 17 — ABNORMAL HIGH (ref 5–15)
BUN: 73 mg/dL — ABNORMAL HIGH (ref 8–23)
CO2: 20 mmol/L — ABNORMAL LOW (ref 22–32)
Calcium: 8.3 mg/dL — ABNORMAL LOW (ref 8.9–10.3)
Chloride: 95 mmol/L — ABNORMAL LOW (ref 98–111)
Creatinine, Ser: 7.41 mg/dL — ABNORMAL HIGH (ref 0.61–1.24)
GFR, Estimated: 8 mL/min — ABNORMAL LOW (ref >60)
Glucose, Bld: 252 mg/dL — ABNORMAL HIGH (ref 70–99)
Phosphorus: 5.1 mg/dL — ABNORMAL HIGH (ref 2.5–4.6)
Potassium: 4 mmol/L (ref 3.5–5.1)
Sodium: 132 mmol/L — ABNORMAL LOW (ref 135–145)

## 2023-04-29 LAB — GLUCOSE, CAPILLARY
Glucose-Capillary: 326 mg/dL — ABNORMAL HIGH (ref 70–99)
Glucose-Capillary: 279 mg/dL — ABNORMAL HIGH (ref 70–99)
Glucose-Capillary: 138 mg/dL — ABNORMAL HIGH (ref 70–99)
Glucose-Capillary: 128 mg/dL — ABNORMAL HIGH (ref 70–99)

## 2023-04-29 MED ORDER — APIXABAN 5 MG PO TABS
5.0000 mg | ORAL_TABLET | Freq: Two times a day (BID) | ORAL | Status: DC
  Filled 2023-04-29: qty 1

## 2023-04-29 MED ORDER — SODIUM CHLORIDE 0.9 % IV BOLUS
250.0000 mL | Freq: Once | INTRAVENOUS | Status: AC
  Administered 2023-04-29: 250 mL via INTRAVENOUS

## 2023-04-29 MED ORDER — LIDOCAINE-PRILOCAINE 2.5-2.5 % EX CREA: 1.0000 | TOPICAL_CREAM | CUTANEOUS | Status: DC | PRN

## 2023-04-29 MED ORDER — ASPIRIN 81 MG PO TBEC
81.0000 mg | DELAYED_RELEASE_TABLET | Freq: Every day | ORAL | Status: DC
  Administered 2023-04-29: 81 mg via ORAL
  Filled 2023-04-29 (×2): qty 1

## 2023-04-29 MED ORDER — LIDOCAINE HCL (PF) 1 % IJ SOLN: 5.0000 mL | INTRAMUSCULAR | Status: DC | PRN

## 2023-04-29 MED ORDER — FIDAXOMICIN 200 MG PO TABS
200.0000 mg | ORAL_TABLET | Freq: Two times a day (BID) | ORAL | Status: DC
  Administered 2023-04-29 – 2023-05-02 (×7): 200 mg via ORAL
  Filled 2023-04-29 (×9): qty 1

## 2023-04-29 MED ORDER — AMIODARONE HCL IN DEXTROSE 360-4.14 MG/200ML-% IV SOLN
30.0000 mg/h | INTRAVENOUS | Status: DC
  Administered 2023-04-30: 30 mg/h via INTRAVENOUS
  Filled 2023-04-29: qty 200

## 2023-04-29 MED ORDER — HEPARIN SODIUM (PORCINE) 1000 UNIT/ML DIALYSIS: 1000.0000 [IU] | INTRAMUSCULAR | Status: DC | PRN

## 2023-04-29 MED ORDER — MIDODRINE HCL 5 MG PO TABS
10.0000 mg | ORAL_TABLET | Freq: Once | ORAL | Status: AC
  Administered 2023-04-29: 10 mg via ORAL
  Filled 2023-04-29: qty 2

## 2023-04-29 MED ORDER — ANTICOAGULANT SODIUM CITRATE 4% (200MG/5ML) IV SOLN: 5.0000 mL | Status: DC | PRN

## 2023-04-29 MED ORDER — AMIODARONE IV BOLUS ONLY 150 MG/100ML
150.0000 mg | Freq: Once | INTRAVENOUS | Status: AC
  Administered 2023-04-29: 150 mg via INTRAVENOUS
  Filled 2023-04-29: qty 100

## 2023-04-29 MED ORDER — ALTEPLASE 2 MG IJ SOLR: 2.0000 mg | Freq: Once | INTRAMUSCULAR | Status: DC | PRN

## 2023-04-29 MED ORDER — AMIODARONE HCL IN DEXTROSE 360-4.14 MG/200ML-% IV SOLN
60.0000 mg/h | INTRAVENOUS | Status: AC
  Administered 2023-04-29: 60 mg/h via INTRAVENOUS
  Filled 2023-04-29: qty 200

## 2023-04-29 MED ORDER — OXYCODONE HCL 5 MG PO TABS
5.0000 mg | ORAL_TABLET | Freq: Once | ORAL | Status: AC
  Administered 2023-04-29: 5 mg via ORAL
  Filled 2023-04-29: qty 1

## 2023-04-29 MED ORDER — CLONAZEPAM 0.5 MG PO TBDP
0.5000 mg | ORAL_TABLET | Freq: Once | ORAL | Status: AC
  Administered 2023-04-29: 0.5 mg via ORAL
  Filled 2023-04-29: qty 1

## 2023-04-29 MED ORDER — PENTAFLUOROPROP-TETRAFLUOROETH EX AERO: 1.0000 | INHALATION_SPRAY | CUTANEOUS | Status: DC | PRN

## 2023-04-29 NOTE — Progress Notes (Signed)
 [facility] Progress Note  Subjective:  No c/o's  Vitals:   04/28/23 2318 04/29/23 0355 04/29/23 0400 04/29/23 0834  BP:  (!) 115/55 (!) 123/53 (!) 117/42  Pulse:  99 100 (!) 109  Resp:  15 20 (!) 27  Temp: 99.6 F (37.6 C) 98.4 F (36.9 C)  98.8 F (37.1 C)  TempSrc: Oral Oral  Oral  SpO2:  94% 90% 94%  Weight:      Height:        Exam: Gen alert, no distress No jvd or bruits Chest clear bilat to bases RRR no MRG Abd soft ntnd no mass or ascites +bs GU normal male MS bilateral BKA Ext no LE or UE edema Neuro is alert, Ox 3 , nf    RIJ TDC intact        Renal-related home meds: Coreg  12.5 Mg twice daily Sensipar  30 mg TTS Sevelamer  2 AC 3 times daily Midodrine  10 mg before HD TTS Rena-Vite once daily Others: Neurontin , Lipitor , insulin , Plavix , aspirin , Eliquis       OP HD:  [facility] From 04/14/23 --> 3.5h  B400   85.5kg  1K bath  TDC   Heparin  none       Assessment/ Plan: GI bleed: w/ melena, N/V, Hb 5.6 on admit. S/P prbc's. Duodenal ulcer per endoscopy. Per pmd / GI.  ESRD: on HD TTS. HD today.  BP: chronic hypotension on midodrine  pre HD. BP 90s initially, now bp's 110-120   Volume: up 3kg by wt, goal UF 2-2.5 L w/ HD today Anemia of esrd: Hb low, getting prbc's, and GI work-up.  Secondary hyperparathyroidism: CCa and phos in range, cont binders w/ meals PAD sp bilat BKA       Rob Daniele Yankowski MD  [facility] 04/29/2023, 12:30 PM  Recent Labs  Lab 04/28/23 0639 04/28/23 0640 04/28/23 1820 04/29/23 0437  HGB  --    < > 9.8* 8.7*  ALBUMIN  2.3*  --   --  2.1*  CALCIUM  8.1*  --   --  8.3*  PHOS 5.6*  --   --  5.1*  CREATININE 5.93*  --   --  7.41*  K 4.2  --   --  4.0   < > = values in this interval not displayed.   No results for input(s): "IRON", "TIBC", "FERRITIN" in the last 168 hours. Inpatient medications:  sodium chloride    Intravenous Once   sodium chloride    Intravenous Once   apixaban   5 mg Oral BID   atorvastatin   80  mg Oral QPM   Chlorhexidine  Gluconate Cloth  6 each Topical Daily   Chlorhexidine  Gluconate Cloth  6 each Topical Q0600   Chlorhexidine  Gluconate Cloth  6 each Topical Q0600   cinacalcet   30 mg Oral Q T,Th,Sa-HD   clopidogrel   75 mg Oral Daily   fidaxomicin   200 mg Oral BID   gabapentin   300 mg Oral QHS   insulin  aspart  0-15 Units Subcutaneous TID WC   insulin  glargine-yfgn  14 Units Subcutaneous Daily   midodrine   10 mg Oral Once per day on Monday Wednesday Friday   multivitamin  1 tablet Oral QHS   pantoprazole  (PROTONIX ) IV  40 mg Intravenous Q6H   Followed by   Cecily Cohen ON 04/30/2023] pantoprazole  (PROTONIX ) IV  40 mg Intravenous Q12H   sevelamer  carbonate  1,600 mg Oral TID WC   sodium chloride  flush  3 mL Intravenous Q12H    anticoagulant sodium citrate   acetaminophen  **OR** acetaminophen , albuterol , alteplase , anticoagulant sodium citrate , heparin , lidocaine  (PF), lidocaine -prilocaine , ondansetron  **OR** ondansetron  (ZOFRAN ) IV, pentafluoroprop-tetrafluoroeth, sodium chloride  flush

## 2023-04-29 NOTE — Progress Notes (Signed)
 PROGRESS NOTE    Anthony Bullock  UEA:[id number] DOB: [birth year] DOA: 04/27/2023 PCP: Marquetta Sit, MD    Chief Complaint  Patient presents with   Diarrhea    Brief Narrative:   63 y.o. M with ESRD on HD TThS, sCHF EF 40%, isch CM, HTN, HLD, pAF on Eliquis , DM, PVD s/p bilateral BKA, anemia and CAD with recent DES who presented with vomiting, diarrhea, and dark stools since discharge from his recent stents.   Found to have Hgb 5.4 g/dL, FOBT+, hypotension, and Cdiff.  Assessment & Plan:   Principal Problem:   Acute blood loss anemia Active Problems:   UGI bleed   C. difficile colitis   Paroxysmal atrial fibrillation (HCC)   COPD (chronic obstructive pulmonary disease) (HCC)   Chronic systolic congestive heart failure (HCC)   CAD (coronary artery disease)   Peripheral vascular disease (HCC)   ESRD on dialysis (HCC)   Anemia due to chronic kidney disease   S/P bilateral below knee amputation (HCC)   Type 2 diabetes mellitus with peripheral neuropathy (HCC)   Dyslipidemia   Elevated troponin   Hyponatremia   Duodenal ulcer disease    Upper GI bleed with hypotension Duodenal ulcer Hematemesis/melena Acute blood loss anemia Anemia of chronic kidney disease -Patient on aspirin , Plavix  and Eliquis  at home -Transfused 4 units PRBC so far, hemoglobin is stable this morning -S/p endoscopy, significant for duodenal ulcer - Resumed on Plavix /18. -Hemoglobin stable, dark BM today likely from old blood, will resume Eliquis , monitor CBC closely. -Target for hemoglobin> 8, given history of recent NSTEMI - Continue with PPI.   Recent NSTEMI status post cath with stent placement -Please see above discussion, resumed on Plavix , cardiology input appreciated, no further aspirin  due to above.   Paroxysmal atrial fibrillation -Will resume Eliquis  today given stable hemoglobin -Coreg  on hold due to hypotension    Chronic HFmrEF - Volume management with  dialysis  Hyponatremia Due to hypovolemia - Trend Na   Elevated troponin Due to myocardial injury due to anemia.  Ischemia ruled out   Dyslipidemia - Continue Lipitor   C. difficile colitis -On p.o. vancomycin , changed to fidaxomicin  given he is high risk to finish 10 days, GI input appreciated   Type 2 diabetes mellitus with peripheral neuropathy (HCC) With hyperglycemia - Continue Semglee  - SS correction insulin  ordered   S/P bilateral below knee amputation (HCC) -Patient has prosthesis with him, will consult PT, OT     ESRD on dialysis (HCC) Intra-dialytic hypotension - Consult Nephrology for HD - Continue Renavit, Sevelamer , midodrine , Sensipar      Peripheral vascular disease (HCC) -Continue with Plavix  - Hold aspirin  indefinitely - Continue statin     COPD (chronic obstructive pulmonary disease) (HCC) No evidence of flare      DVT prophylaxis: Eliquis  Code Status: Full Family Communication: None at bedside Disposition:   Status is: Inpatient    Consultants:  Cardiology Renal GI   Subjective:  Reported feeling better, diarrhea has improved and decreased in frequency, no abdominal pain, dark stools.  Objective: Vitals:   04/28/23 2318 04/29/23 0355 04/29/23 0400 04/29/23 0834  BP:  (!) 115/55 (!) 123/53 (!) 117/42  Pulse:  99 100 (!) 109  Resp:  15 20 (!) 27  Temp: 99.6 F (37.6 C) 98.4 F (36.9 C)  98.8 F (37.1 C)  TempSrc: Oral Oral  Oral  SpO2:  94% 90% 94%  Weight:      Height:        Intake/Output  Summary (Last 24 hours) at 04/29/2023 1143 Last data filed at 04/28/2023 2220 Gross per 24 hour  Intake 368 ml  Output --  Net 368 ml   Filed Weights   04/27/23 0347  Weight: 89.4 kg    Examination:  Awake Alert, Oriented X 3, No new F.N deficits, Normal affect Symmetrical Chest wall movement, Good air movement bilaterally, CTAB RRR,No Gallops,Rubs or new Murmurs, No Parasternal Heave +ve B.Sounds, Abd Soft, No tenderness, No  rebound - guarding or rigidity. Bilateral BKA     Data Reviewed: I have personally reviewed following labs and imaging studies  CBC: Recent Labs  Lab 04/27/23 0404 04/27/23 2116 04/28/23 0640 04/28/23 1117 04/28/23 1820 04/29/23 0437  WBC 21.0* 35.6* 32.3* 30.3*  --  26.7*  HGB 5.6* 7.2* 6.4* 7.5* 9.8* 8.7*  HCT 17.4* 21.3* 19.4* 22.6* 28.2* 25.3*  MCV 93.0 91.0 91.9 91.1  --  88.2  PLT 230 218 210 185  --  192    Basic Metabolic Panel: Recent Labs  Lab 04/27/23 0404 04/28/23 0639 04/29/23 0437  NA 130* 130* 132*  K 3.5 4.2 4.0  CL 91* 91* 95*  CO2 19* 18* 20*  GLUCOSE 197* 273* 252*  BUN 95* 55* 73*  CREATININE 8.20* 5.93* 7.41*  CALCIUM  8.3* 8.1* 8.3*  PHOS  --  5.6* 5.1*    GFR: Estimated Creatinine Clearance: 11.8 mL/min (A) (by C-G formula based on SCr of 7.41 mg/dL (H)).  Liver Function Tests: Recent Labs  Lab 04/27/23 0404 04/28/23 0639 04/29/23 0437  AST 28  --   --   ALT 22  --   --   ALKPHOS 62  --   --   BILITOT 0.4  --   --   PROT 5.9*  --   --   ALBUMIN  2.6* 2.3* 2.1*    CBG: Recent Labs  Lab 04/27/23 2116 04/28/23 0814 04/28/23 1620 04/28/23 2114 04/29/23 0833  GLUCAP 153* 290* 235* 267* 326*     Recent Results (from the past 240 hours)  Gastrointestinal Panel by PCR , Stool     Status: None   Collection Time: 04/27/23 10:55 PM   Specimen: STOOL  Result Value Ref Range Status   Campylobacter species NOT DETECTED NOT DETECTED Final   Plesimonas shigelloides NOT DETECTED NOT DETECTED Final   Salmonella species NOT DETECTED NOT DETECTED Final   Yersinia enterocolitica NOT DETECTED NOT DETECTED Final   Vibrio species NOT DETECTED NOT DETECTED Final   Vibrio cholerae NOT DETECTED NOT DETECTED Final   Enteroaggregative E coli (EAEC) NOT DETECTED NOT DETECTED Final   Enteropathogenic E coli (EPEC) NOT DETECTED NOT DETECTED Final   Enterotoxigenic E coli (ETEC) NOT DETECTED NOT DETECTED Final   Shiga like toxin producing E coli  (STEC) NOT DETECTED NOT DETECTED Final   Shigella/Enteroinvasive E coli (EIEC) NOT DETECTED NOT DETECTED Final   Cryptosporidium NOT DETECTED NOT DETECTED Final   Cyclospora cayetanensis NOT DETECTED NOT DETECTED Final   Entamoeba histolytica NOT DETECTED NOT DETECTED Final   Giardia lamblia NOT DETECTED NOT DETECTED Final   Adenovirus F40/41 NOT DETECTED NOT DETECTED Final   Astrovirus NOT DETECTED NOT DETECTED Final   Norovirus GI/GII NOT DETECTED NOT DETECTED Final   Rotavirus A NOT DETECTED NOT DETECTED Final   Sapovirus (I, II, IV, and V) NOT DETECTED NOT DETECTED Final    Comment: Performed at [facility], [address], [place], [place] [address]  C Difficile Quick Screen w PCR reflex  Status: Abnormal   Collection Time: 04/27/23 10:55 PM   Specimen: STOOL  Result Value Ref Range Status   C Diff antigen POSITIVE (A) NEGATIVE Final   C Diff toxin POSITIVE (A) NEGATIVE Final   C Diff interpretation Toxin producing C. difficile detected.  Final    Comment: RESULTS CALLED TO READ BACK BY AND VERIFIED WITH RN L.CARDIERI ON 04/28/23 AT 0041 BY NM Performed at [facility], [address] [place], [place] [address]          Radiology Studies: No results found.      Scheduled Meds:  sodium chloride    Intravenous Once   sodium chloride    Intravenous Once   apixaban   5 mg Oral BID   atorvastatin   80 mg Oral QPM   Chlorhexidine  Gluconate Cloth  6 each Topical Daily   Chlorhexidine  Gluconate Cloth  6 each Topical Q0600   Chlorhexidine  Gluconate Cloth  6 each Topical Q0600   cinacalcet   30 mg Oral Q T,Th,Sa-HD   clopidogrel   75 mg Oral Daily   fidaxomicin   200 mg Oral BID   gabapentin   300 mg Oral QHS   insulin  aspart  0-15 Units Subcutaneous TID WC   insulin  glargine-yfgn  14 Units Subcutaneous Daily   midodrine   10 mg Oral Once per day on Monday Wednesday Friday   multivitamin  1 tablet Oral QHS   pantoprazole  (PROTONIX ) IV  40 mg Intravenous Q6H    Followed by   Cecily Cohen ON 04/30/2023] pantoprazole  (PROTONIX ) IV  40 mg Intravenous Q12H   sevelamer  carbonate  1,600 mg Oral TID WC   sodium chloride  flush  3 mL Intravenous Q12H   Continuous Infusions:  anticoagulant sodium citrate        LOS: 2 days        Seena Dadds, MD [facility]   To contact the attending provider between 7A-7P or the covering provider during after hours 7P-7A, please log into the web site [email or web address] and access using universal [place] password for that web site. If you do not have the password, please call the hospital operator.  04/29/2023, 11:43 AM

## 2023-04-29 NOTE — Plan of Care (Signed)
  Problem: Coping: Goal: Ability to adjust to condition or change in health will improve Outcome: Progressing   Problem: Fluid Volume: Goal: Ability to maintain a balanced intake and output will improve Outcome: Progressing   Problem: Nutritional: Goal: Maintenance of adequate nutrition will improve Outcome: Progressing   Problem: Skin Integrity: Goal: Risk for impaired skin integrity will decrease Outcome: Progressing   Problem: Clinical Measurements: Goal: Diagnostic test results will improve Outcome: Progressing Goal: Respiratory complications will improve Outcome: Progressing   Problem: Activity: Goal: Risk for activity intolerance will decrease Outcome: Progressing   Problem: Elimination: Goal: Will not experience complications related to bowel motility Outcome: Progressing

## 2023-04-29 NOTE — Progress Notes (Signed)
   04/29/23 1636  Vitals  Temp 98.4 F (36.9 C)  Pulse Rate 91  Resp 15  BP (!) 107/52  SpO2 98 %  O2 Device Room Air  Weight 91.4 kg  Oxygen  Therapy  Patient Activity (if Appropriate) In bed  Pulse Oximetry Type Continuous  Oximetry Probe Site Changed No  During Treatment Monitoring  Blood Flow Rate (mL/min) 0 mL/min  Arterial Pressure (mmHg) -28.89 mmHg  Venous Pressure (mmHg) 39.39 mmHg  TMP (mmHg) 8.28 mmHg  Ultrafiltration Rate (mL/min) 595 mL/min  Dialysate Flow Rate (mL/min) 299 ml/min  Dialysate Potassium Concentration 3  Dialysate Calcium  Concentration 2.5  Duration of HD Treatment -hour(s) 3 hour(s)  Cumulative Fluid Removed (mL) per Treatment  1000.07  HD Safety Checks Performed Yes  Intra-Hemodialysis Comments Tx completed   Received patient in bed to unit.  Alert and oriented.  Informed consent signed and in chart.   TX duration: 3  Patient tolerated well.  Transported back to the room  Alert, without acute distress.  Hand-off given to patient's nurse.   Access used: Dialysis Catheter Access issues: None  Total UF removed: 2.0 Medication(s) given: None   Deann Exon, LPN  [facility]

## 2023-04-29 NOTE — Plan of Care (Signed)

## 2023-04-29 NOTE — Progress Notes (Signed)
 Discussed with cardiology, patient most recent A-fib was during COVID infection last year, since then no recurrent of A-fib, but was felt Eliquis  will not be strongly indicated currently, and given his recent PCI, apparent felt more appropriate option to add to his home Plavix  and instead of Eliquis , I have discussed with GI, and as long there is a baby aspirin  that should be fine, (not 325 mg aspirin ), so he will be resumed on baby aspirin , continue with Plavix . Seena Dadds MD

## 2023-04-29 NOTE — Progress Notes (Signed)
 Patient did not want to wear CPAP for the night. Patient wearing oxygen  set at 3lpm with sp02=96%

## 2023-04-29 NOTE — Progress Notes (Signed)
 [facility] ROUNDING NOTE   Subjective: Feels better. Still having diarrhea with dark stool but decreased frequency.  Denies any abdominal pain.  He is tolerating diet.  Objective: Vital signs in last 24 hours: Temp:  [96.8 F (36 C)-99.6 F (37.6 C)] 98.8 F (37.1 C) (04/19 0834) Pulse Rate:  [86-109] 109 (04/19 0834) Resp:  [15-36] 27 (04/19 0834) BP: (89-123)/(42-59) 117/42 (04/19 0834) SpO2:  [90 %-99 %] 94 % (04/19 0834) Last BM Date : 04/29/23 General: NAD, sitting on the edge of bed Abdomen: Soft, nontender nondistended Ext: Bilateral BKA    Intake/Output from previous day: 04/18 0701 - 04/19 0700 In: 843 [I.V.:3; Blood:840] Out: -  Intake/Output this shift: No intake/output data recorded.   Lab Results: Recent Labs    04/28/23 0640 04/28/23 1117 04/28/23 1820 04/29/23 0437  WBC 32.3* 30.3*  --  26.7*  HGB 6.4* 7.5* 9.8* 8.7*  PLT 210 185  --  192  MCV 91.9 91.1  --  88.2   BMET Recent Labs    04/27/23 0404 04/28/23 0639 04/29/23 0437  NA 130* 130* 132*  K 3.5 4.2 4.0  CL 91* 91* 95*  CO2 19* 18* 20*  GLUCOSE 197* 273* 252*  BUN 95* 55* 73*  CREATININE 8.20* 5.93* 7.41*  CALCIUM  8.3* 8.1* 8.3*   LFT Recent Labs    04/27/23 0404 04/28/23 0639 04/29/23 0437  PROT 5.9*  --   --   ALBUMIN  2.6* 2.3* 2.1*  AST 28  --   --   ALT 22  --   --   ALKPHOS 62  --   --   BILITOT 0.4  --   --    PT/INR No results for input(s): "INR" in the last 72 hours.    Imaging/Other results: No results found.    Assessment &Plan  63 year old very pleasant gentleman with history of CAD s/p recent PCI, end-stage renal disease on hemodialysis, peripheral vascular disease, C. difficile colitis Status post EGD yesterday with superficial duodenal ulcers, no high risk lesions Resumed Plavix  yesterday Hemoglobin is stable, he is passing dark-colored stool possibly residual blood we will continue to monitor Continue PPI twice daily Monitor  hemoglobin and transfuse as needed  Switch to fidaxomicin  from oral vancomycin , he is at risk for recurrence given his comorbidities.  Complete 10-day course Continue diet as tolerated  GI is available if needed, please call with any questions    K. Veena Valary Manahan , MD [phone]  [facility]

## 2023-04-29 NOTE — Progress Notes (Signed)
 Progress Note  Patient Name: Anthony Bullock Date of Encounter: 04/29/2023  Primary Cardiologist: Armida Lander, MD  Subjective   No chest pains but he had black stool this morning between 9 am and 10 am.   Inpatient Medications    Scheduled Meds:  sodium chloride    Intravenous Once   sodium chloride    Intravenous Once   atorvastatin   80 mg Oral QPM   Chlorhexidine  Gluconate Cloth  6 each Topical Daily   Chlorhexidine  Gluconate Cloth  6 each Topical Q0600   Chlorhexidine  Gluconate Cloth  6 each Topical Q0600   cinacalcet   30 mg Oral Q T,Th,Sa-HD   clopidogrel   75 mg Oral Daily   fidaxomicin   200 mg Oral BID   gabapentin   300 mg Oral QHS   insulin  aspart  0-15 Units Subcutaneous TID WC   insulin  glargine-yfgn  14 Units Subcutaneous Daily   midodrine   10 mg Oral Once per day on Monday Wednesday Friday   multivitamin  1 tablet Oral QHS   pantoprazole  (PROTONIX ) IV  40 mg Intravenous Q6H   Followed by   Cecily Cohen ON 04/30/2023] pantoprazole  (PROTONIX ) IV  40 mg Intravenous Q12H   sevelamer  carbonate  1,600 mg Oral TID WC   sodium chloride  flush  3 mL Intravenous Q12H   Continuous Infusions:  anticoagulant sodium citrate      PRN Meds: acetaminophen  **OR** acetaminophen , albuterol , alteplase , anticoagulant sodium citrate , heparin , lidocaine  (PF), lidocaine -prilocaine , ondansetron  **OR** ondansetron  (ZOFRAN ) IV, pentafluoroprop-tetrafluoroeth, sodium chloride  flush   Vital Signs    Vitals:   04/29/23 0400 04/29/23 0834 04/29/23 1311 04/29/23 1330  BP: (!) 123/53 (!) 117/42 (!) 90/38 (!) 92/46  Pulse: 100 (!) 109 83 86  Resp: 20 (!) 27 13 (!) 29  Temp:  98.8 F (37.1 C) 97.6 F (36.4 C)   TempSrc:  Oral Oral   SpO2: 90% 94% 96% 94%  Weight:   92.8 kg   Height:        Intake/Output Summary (Last 24 hours) at 04/29/2023 1349 Last data filed at 04/28/2023 2220 Gross per 24 hour  Intake 368 ml  Output --  Net 368 ml   Filed Weights   04/27/23 0347 04/29/23 1311   Weight: 89.4 kg 92.8 kg    Telemetry     Personally reviewed. NSR.  ECG    Not obtained today  Physical Exam   GEN: No acute distress.   Neck: No JVD. Cardiac: RRR, no murmur, rub, or gallop.  Respiratory: Nonlabored. Clear to auscultation bilaterally. GI: Soft, nontender, bowel sounds present. MS: No edema; No deformity. Neuro:  Nonfocal. Psych: Alert and oriented x 3. Normal affect.  Labs    Chemistry Recent Labs  Lab 04/27/23 0404 04/28/23 0639 04/29/23 0437  NA 130* 130* 132*  K 3.5 4.2 4.0  CL 91* 91* 95*  CO2 19* 18* 20*  GLUCOSE 197* 273* 252*  BUN 95* 55* 73*  CREATININE 8.20* 5.93* 7.41*  CALCIUM  8.3* 8.1* 8.3*  PROT 5.9*  --   --   ALBUMIN  2.6* 2.3* 2.1*  AST 28  --   --   ALT 22  --   --   ALKPHOS 62  --   --   BILITOT 0.4  --   --   GFRNONAA 7* 10* 8*  ANIONGAP 20* 21* 17*     Hematology Recent Labs  Lab 04/28/23 0640 04/28/23 1117 04/28/23 1820 04/29/23 0437  WBC 32.3* 30.3*  --  26.7*  RBC 2.11* 2.48*  --  2.87*  HGB 6.4* 7.5* 9.8* 8.7*  HCT 19.4* 22.6* 28.2* 25.3*  MCV 91.9 91.1  --  88.2  MCH 30.3 30.2  --  30.3  MCHC 33.0 33.2  --  34.4  RDW 16.3* 15.9*  --  15.8*  PLT 210 185  --  192    Cardiac Enzymes Recent Labs  Lab 04/11/23 0933 04/12/23 0656 04/12/23 0817 04/27/23 0404 04/27/23 0657  TROPONINIHS 16,311* 12,078* 11,346* 345* 387*    BNPNo results for input(s): "BNP", "PROBNP" in the last 168 hours.   DDimerNo results for input(s): "DDIMER" in the last 168 hours.   Radiology    No results found.  Assessment & Plan     Acute blood loss anemia: Melena since PCI. S/p PRBC transfusion this admission.  EGD revealed nonbleeding duodenal ulcers.  He had melena/black stools this morning between 9 AM and 10 AM. Despite PRBC transfusion, his hemoglobin dropped from 9.4 yesterday to 8 today. GI on board.  CAD manifested by NSTEMI s/p OM PCI and bifurcation stenting of LAD/D1, staged RCA PCI with residual critical  Lcx disease with LVEF 40%: On triple therapy after PCI, aspirin  81 mg once daily, Plavix  75 mg once daily and Eliquis  5 mg twice daily.  He presented now with acute blood loss anemia with melena. EGD revealed nonbleeding duodenal ulcers. Currently on Plavix  monotherapy.  Start aspirin  81 mg once daily if cleared by GI.  If he continues to bleed on DAPT, will skip aspirin  and continue Plavix  monotherapy.  I would not start Eliquis  or alternative agents due to increased bleeding risk.  He had only 1 episode of A-fib last year during COVID illness and no recurrence since then.  Can skip Eliquis .  He might benefit from LAA closure device placement in the future however he will need to tolerate at least DAPT now to be eligible for watchman in the future.  Paroxysmal A-fib: Had 1 occurrence during COVID last year.  No recurrence since then.  Has been in normal sinus rhythm since admission.  Not on rate controlling agents due to soft BP.  Not a candidate for systemic AC due to active GI bleed.  NICM LVEF 40 to 45%: Compensated.  ESRD dialysis dependent.  Not on GDMT.  On midodrine  for BP support.  PAD, bilateral BKA: Cardioprotective medications as stated above.     Signed, Lasalle Pointer, MD  04/29/2023, 1:49 PM

## 2023-04-29 NOTE — Progress Notes (Addendum)
 Patient went into Afib w/ RVR. HR 130-137. BP 104/77.  Amiodarone  bolus and drip initiated.  Borderline SBP, down to 83.  Midodrine  and small bolus IVF 250c  ordered.  Patient is made hypotensive.  Initial 250 cc bolus ordered by rapid response.  CBC done showed stable hemoglobin, decreasing WBC 17.7.  Systolic blood pressure decreased into the 50s.  Unable to give more aggressive IV fluid hydration as patient ESRD.  Levophed  initiated.  PCCM consulted CXR done shows left lower lobe atelectasis versus pneumonia.

## 2023-04-30 ENCOUNTER — Inpatient Hospital Stay (HOSPITAL_COMMUNITY)

## 2023-04-30 DIAGNOSIS — Z992 Dependence on renal dialysis: Secondary | ICD-10-CM | POA: Diagnosis not present

## 2023-04-30 DIAGNOSIS — I4891 Unspecified atrial fibrillation: Secondary | ICD-10-CM

## 2023-04-30 DIAGNOSIS — N186 End stage renal disease: Secondary | ICD-10-CM | POA: Diagnosis not present

## 2023-04-30 DIAGNOSIS — A0471 Enterocolitis due to Clostridium difficile, recurrent: Secondary | ICD-10-CM

## 2023-04-30 DIAGNOSIS — I2510 Atherosclerotic heart disease of native coronary artery without angina pectoris: Secondary | ICD-10-CM | POA: Diagnosis not present

## 2023-04-30 DIAGNOSIS — K269 Duodenal ulcer, unspecified as acute or chronic, without hemorrhage or perforation: Secondary | ICD-10-CM | POA: Diagnosis not present

## 2023-04-30 DIAGNOSIS — I959 Hypotension, unspecified: Secondary | ICD-10-CM | POA: Diagnosis not present

## 2023-04-30 DIAGNOSIS — K922 Gastrointestinal hemorrhage, unspecified: Secondary | ICD-10-CM | POA: Diagnosis not present

## 2023-04-30 DIAGNOSIS — A0472 Enterocolitis due to Clostridium difficile, not specified as recurrent: Secondary | ICD-10-CM

## 2023-04-30 DIAGNOSIS — D62 Acute posthemorrhagic anemia: Secondary | ICD-10-CM | POA: Diagnosis not present

## 2023-04-30 DIAGNOSIS — I429 Cardiomyopathy, unspecified: Secondary | ICD-10-CM

## 2023-04-30 DIAGNOSIS — Z955 Presence of coronary angioplasty implant and graft: Secondary | ICD-10-CM | POA: Diagnosis not present

## 2023-04-30 DIAGNOSIS — I5020 Unspecified systolic (congestive) heart failure: Secondary | ICD-10-CM

## 2023-04-30 DIAGNOSIS — K921 Melena: Secondary | ICD-10-CM | POA: Diagnosis not present

## 2023-04-30 LAB — RENAL FUNCTION PANEL
Albumin: 2 g/dL — ABNORMAL LOW (ref 3.5–5.0)
Anion gap: 15 (ref 5–15)
BUN: 38 mg/dL — ABNORMAL HIGH (ref 8–23)
CO2: 20 mmol/L — ABNORMAL LOW (ref 22–32)
Calcium: 7.3 mg/dL — ABNORMAL LOW (ref 8.9–10.3)
Chloride: 86 mmol/L — ABNORMAL LOW (ref 98–111)
Creatinine, Ser: 5.09 mg/dL — ABNORMAL HIGH (ref 0.61–1.24)
GFR, Estimated: 12 mL/min — ABNORMAL LOW (ref >60)
Glucose, Bld: 561 mg/dL (ref 70–99)
Phosphorus: 3.9 mg/dL (ref 2.5–4.6)
Potassium: 3.5 mmol/L (ref 3.5–5.1)
Sodium: 121 mmol/L — ABNORMAL LOW (ref 135–145)

## 2023-04-30 LAB — COMPREHENSIVE METABOLIC PANEL WITH GFR
ALT: 25 U/L (ref 0–44)
AST: 47 U/L — ABNORMAL HIGH (ref 15–41)
Albumin: 2.2 g/dL — ABNORMAL LOW (ref 3.5–5.0)
Alkaline Phosphatase: 71 U/L (ref 38–126)
Anion gap: 17 — ABNORMAL HIGH (ref 5–15)
BUN: 45 mg/dL — ABNORMAL HIGH (ref 8–23)
CO2: 23 mmol/L (ref 22–32)
Calcium: 8.3 mg/dL — ABNORMAL LOW (ref 8.9–10.3)
Chloride: 93 mmol/L — ABNORMAL LOW (ref 98–111)
Creatinine, Ser: 5.93 mg/dL — ABNORMAL HIGH (ref 0.61–1.24)
GFR, Estimated: 10 mL/min — ABNORMAL LOW (ref >60)
Glucose, Bld: 234 mg/dL — ABNORMAL HIGH (ref 70–99)
Potassium: 3.9 mmol/L (ref 3.5–5.1)
Sodium: 133 mmol/L — ABNORMAL LOW (ref 135–145)
Total Bilirubin: 0.8 mg/dL (ref 0.0–1.2)
Total Protein: 5.9 g/dL — ABNORMAL LOW (ref 6.5–8.1)

## 2023-04-30 LAB — BASIC METABOLIC PANEL WITH GFR
Anion gap: 13 (ref 5–15)
BUN: 40 mg/dL — ABNORMAL HIGH (ref 8–23)
CO2: 24 mmol/L (ref 22–32)
Calcium: 8.1 mg/dL — ABNORMAL LOW (ref 8.9–10.3)
Chloride: 96 mmol/L — ABNORMAL LOW (ref 98–111)
Creatinine, Ser: 5.29 mg/dL — ABNORMAL HIGH (ref 0.61–1.24)
GFR, Estimated: 12 mL/min — ABNORMAL LOW (ref >60)
Glucose, Bld: 148 mg/dL — ABNORMAL HIGH (ref 70–99)
Potassium: 3.7 mmol/L (ref 3.5–5.1)
Sodium: 133 mmol/L — ABNORMAL LOW (ref 135–145)

## 2023-04-30 LAB — CBC WITH DIFFERENTIAL/PLATELET
Abs Immature Granulocytes: 0.13 10*3/uL — ABNORMAL HIGH (ref 0.00–0.07)
Basophils Absolute: 0.1 10*3/uL (ref 0.0–0.1)
Basophils Relative: 0 %
Eosinophils Absolute: 0.2 10*3/uL (ref 0.0–0.5)
Eosinophils Relative: 1 %
HCT: 25.2 % — ABNORMAL LOW (ref 39.0–52.0)
Hemoglobin: 8.4 g/dL — ABNORMAL LOW (ref 13.0–17.0)
Immature Granulocytes: 1 %
Lymphocytes Relative: 6 %
Lymphs Abs: 1.1 10*3/uL (ref 0.7–4.0)
MCH: 30 pg (ref 26.0–34.0)
MCHC: 33.3 g/dL (ref 30.0–36.0)
MCV: 90 fL (ref 80.0–100.0)
Monocytes Absolute: 1.5 10*3/uL — ABNORMAL HIGH (ref 0.1–1.0)
Monocytes Relative: 9 %
Neutro Abs: 14.4 10*3/uL — ABNORMAL HIGH (ref 1.7–7.7)
Neutrophils Relative %: 83 %
Platelets: 175 10*3/uL (ref 150–400)
RBC: 2.8 MIL/uL — ABNORMAL LOW (ref 4.22–5.81)
RDW: 16.5 % — ABNORMAL HIGH (ref 11.5–15.5)
WBC: 17.4 10*3/uL — ABNORMAL HIGH (ref 4.0–10.5)
nRBC: 0.2 % (ref 0.0–0.2)

## 2023-04-30 LAB — TROPONIN I (HIGH SENSITIVITY): Troponin I (High Sensitivity): 6990 ng/L

## 2023-04-30 LAB — MRSA NEXT GEN BY PCR, NASAL: MRSA by PCR Next Gen: NOT DETECTED

## 2023-04-30 LAB — GLUCOSE, CAPILLARY
Glucose-Capillary: 139 mg/dL — ABNORMAL HIGH (ref 70–99)
Glucose-Capillary: 172 mg/dL — ABNORMAL HIGH (ref 70–99)
Glucose-Capillary: 195 mg/dL — ABNORMAL HIGH (ref 70–99)
Glucose-Capillary: 213 mg/dL — ABNORMAL HIGH (ref 70–99)
Glucose-Capillary: 266 mg/dL — ABNORMAL HIGH (ref 70–99)
Glucose-Capillary: 289 mg/dL — ABNORMAL HIGH (ref 70–99)
Glucose-Capillary: 298 mg/dL — ABNORMAL HIGH (ref 70–99)

## 2023-04-30 LAB — PROCALCITONIN: Procalcitonin: 1.74 ng/mL

## 2023-04-30 LAB — LACTIC ACID, PLASMA
Lactic Acid, Venous: 1.3 mmol/L (ref 0.5–1.9)
Lactic Acid, Venous: 1.3 mmol/L (ref 0.5–1.9)

## 2023-04-30 MED ORDER — NOREPINEPHRINE 4 MG/250ML-% IV SOLN
1.0000 ug/min | INTRAVENOUS | Status: DC
Start: 2023-04-30 — End: 2023-05-01
  Administered 2023-04-30: 5 ug/min via INTRAVENOUS

## 2023-04-30 MED ORDER — HEPARIN (PORCINE) 25000 UT/250ML-% IV SOLN
1150.0000 [IU]/h | INTRAVENOUS | Status: DC
  Administered 2023-04-30 – 2023-05-01 (×2): 1000 [IU]/h via INTRAVENOUS
  Filled 2023-04-30 (×2): qty 250

## 2023-04-30 MED ORDER — ORAL CARE MOUTH RINSE: 15.0000 mL | OROMUCOSAL | Status: DC | PRN

## 2023-04-30 MED ORDER — AMIODARONE HCL 200 MG PO TABS
200.0000 mg | ORAL_TABLET | Freq: Two times a day (BID) | ORAL | Status: DC
  Administered 2023-04-30 – 2023-05-01 (×3): 200 mg via ORAL
  Filled 2023-04-30 (×3): qty 1

## 2023-04-30 MED ORDER — SODIUM CHLORIDE 0.9 % IV BOLUS
250.0000 mL | Freq: Once | INTRAVENOUS | Status: AC
  Administered 2023-04-30: 250 mL via INTRAVENOUS

## 2023-04-30 MED ORDER — PANTOPRAZOLE SODIUM 40 MG IV SOLR
40.0000 mg | Freq: Two times a day (BID) | INTRAVENOUS | Status: DC
  Administered 2023-04-30 – 2023-05-01 (×2): 40 mg via INTRAVENOUS
  Filled 2023-04-30 (×3): qty 10

## 2023-04-30 MED ORDER — MIDODRINE HCL 5 MG PO TABS: 10.0000 mg | ORAL_TABLET | Freq: Three times a day (TID) | ORAL | Status: DC

## 2023-04-30 MED ORDER — MIDODRINE HCL 5 MG PO TABS
10.0000 mg | ORAL_TABLET | Freq: Three times a day (TID) | ORAL | Status: DC
  Administered 2023-04-30 – 2023-05-02 (×7): 10 mg via ORAL
  Filled 2023-04-30 (×7): qty 2

## 2023-04-30 NOTE — Progress Notes (Signed)
 [facility] Progress Note  Subjective:  No c/o's Seen in ICU, sent there for afib management maybe  Vitals:   04/30/23 1715 04/30/23 1730 04/30/23 1745 04/30/23 1800  BP: (!) 87/37   (!) 125/100  Pulse: 76 75 80 79  Resp: (!) 24 (!) 28 (!) 24 19  Temp:      TempSrc:      SpO2: 96% 98% 95% 100%  Weight:      Height:        Exam: Gen alert, no distress No jvd or bruits Chest clear bilat to bases RRR no MRG Abd soft ntnd no mass or ascites +bs MS bilateral BKA Ext no LE or UE edema Neuro is alert, Ox 3 , nf    RIJ TDC intact        Renal-related home meds: Coreg  12.5 Mg twice daily Sensipar  30 mg TTS Sevelamer  2 AC 3 times daily Midodrine  10 mg before HD TTS Rena-Vite once daily Others: Neurontin , Lipitor , insulin , Plavix , aspirin , Eliquis       OP HD:  [facility] From 04/14/23 --> 3.5h  B400   85.5kg  1K bath  TDC   Heparin  none       Assessment/ Plan: GI bleed: w/ melena, N/V, Hb 5.6 on admit. Duodenal ulcer per endoscopy. Hb up 8-9 range after prbc's. Per pmd / GI.  Cdif colitis: on fidaxomicin  x 10d course per GI ESRD: on HD TTS. Next HD 4/22 BP: takes midodrine  pre HD at home. Started on midodrine  10mg  tid here.  Volume: wt's are off, did not tolerate UF on last HD 4/19. Looks dry on exam still. Low UF prob this week.  Anemia of esrd: Hb low, getting prbc's, and GI work-up.  Secondary hyperparathyroidism: CCa and phos in range, cont binders w/ meals PAD sp bilat BKA       Rob Priyal Musquiz MD  [facility] 04/30/2023, 7:01 PM  Recent Labs  Lab 04/29/23 0437 04/29/23 1333 04/30/23 0127 04/30/23 0131 04/30/23 0440 04/30/23 0936  HGB 8.7* 8.4* 8.4*  --   --   --   ALBUMIN  2.1*  --   --   --  2.0* 2.2*  CALCIUM  8.3*  --   --    < > 7.3* 8.3*  PHOS 5.1*  --   --   --  3.9  --   CREATININE 7.41*  --   --    < > 5.09* 5.93*  K 4.0  --   --    < > 3.5 3.9   < > = values in this interval not displayed.   No results for input(s): "IRON", "TIBC",  "FERRITIN" in the last 168 hours. Inpatient medications:  sodium chloride    Intravenous Once   sodium chloride    Intravenous Once   amiodarone   200 mg Oral BID   atorvastatin   80 mg Oral QPM   Chlorhexidine  Gluconate Cloth  6 each Topical Daily   Chlorhexidine  Gluconate Cloth  6 each Topical Q0600   Chlorhexidine  Gluconate Cloth  6 each Topical Q0600   cinacalcet   30 mg Oral Q T,Th,Sa-HD   clopidogrel   75 mg Oral Daily   fidaxomicin   200 mg Oral BID   gabapentin   300 mg Oral QHS   insulin  aspart  0-15 Units Subcutaneous TID WC   insulin  glargine-yfgn  14 Units Subcutaneous Daily   midodrine   10 mg Oral Q8H   multivitamin  1 tablet Oral QHS   pantoprazole  (PROTONIX ) IV  40 mg  Intravenous Q12H   sevelamer  carbonate  1,600 mg Oral TID WC   sodium chloride  flush  3 mL Intravenous Q12H    heparin  1,000 Units/hr (04/30/23 1800)   norepinephrine  (LEVOPHED ) Adult infusion 4 mcg/min (04/30/23 1800)   acetaminophen  **OR** acetaminophen , albuterol , ondansetron  **OR** ondansetron  (ZOFRAN ) IV, mouth rinse, sodium chloride  flush

## 2023-04-30 NOTE — Significant Event (Incomplete)
 Rapid Response Event Note   Reason for Call :  Afib RVR/hypotension    Initial Focused Assessment:       Interventions:    Plan of Care:     Event Summary:   MD Notified:  Call [id number] Arrival Time:2340 End Time:  Dorrine Gaudy, RN

## 2023-04-30 NOTE — Progress Notes (Signed)
 04/30/2023 Add standing midodrine  Amio boluses PRN Looks/feels better since overnight events.  Ardelle Kos MD PCCM

## 2023-04-30 NOTE — Progress Notes (Signed)
 PHARMACY - ANTICOAGULATION CONSULT NOTE  Pharmacy Consult for heparin  Indication: atrial fibrillation  Allergies  Allergen Reactions   Epoetin  Alfa Other (See Comments)    Unknown    Ferumoxytol  Other (See Comments)    Unknown    Morphine Sulfate Rash and Other (See Comments)    Itches all over, red spots    Patient Measurements: Height: 5\' 10"  (177.8 cm) Weight: 91.4 kg (201 lb 8 oz) IBW/kg (Calculated) : 73 HEPARIN  DW (KG): 89.4  Vital Signs: Temp: 98.2 F (36.8 C) (04/20 0730) Temp Source: Axillary (04/20 0730) BP: 137/56 (04/20 0815) Pulse Rate: 82 (04/20 0815)  Labs: Recent Labs    04/29/23 0437 04/29/23 1333 04/30/23 0127 04/30/23 0131 04/30/23 0440  HGB 8.7* 8.4* 8.4*  --   --   HCT 25.3* 25.0* 25.2*  --   --   PLT 192 185 175  --   --   CREATININE 7.41*  --   --  5.29* 5.09*    Estimated Creatinine Clearance: 17.3 mL/min (A) (by C-G formula based on SCr of 5.09 mg/dL (H)).   Medical History: Past Medical History:  Diagnosis Date   AICD (automatic cardioverter/defibrillator) present    [employer]   Allergic rhinitis    Anemia    Arthritis    Chronic systolic heart failure (HCC)    a. ECHO (12/2012) EF 25-30%, HK entireanteroseptal myocardium //  b.  EF 25%, diffuse HK, grade 1 diastolic dysfunction, MAC, mild LAE, normal RVSF, trivial pericardial effusion   COPD (chronic obstructive pulmonary disease) (HCC)    Diabetes mellitus type II    Diabetic nephropathy (HCC)    Diabetic neuropathy (HCC)    ESRD on hemodialysis (HCC)    started HD June 2017, goes to [facility] HD unit, Dr Lieutenant Reese   History of cardiac catheterization    a.Myoview  1/15:  There is significant left ventricular dysfunction. There may be slight scar at the apex. There is no significant ischemia. LV Ejection Fraction: 27%  //  b. RHC/LHC (1/15) with mean RA 6, PA 47/22 mean 33, mean PCWP 20, PVR 2.5 WU, CI 2.5; 80% dLAD stenosis, 70% diffuse large D.     History of kidney  stones    Hyperlipidemia    Hypertension    Kidney stones    NICM (nonischemic cardiomyopathy) (HCC)    Primarily nonischemic.  Echo (12/14) with EF 25-30%.  Echo (3/15) with EF 25%, mild to moderately dilated LV, normal RV size and systolic function.     Osteomyelitis (HCC)    left fifth ray   Pneumonia    Urethral stricture    Wears glasses     Medications:  Medications Prior to Admission  Medication Sig Dispense Refill Last Dose/Taking   acetaminophen  (TYLENOL ) 325 MG tablet Take 1-2 tablets (325-650 mg total) by mouth every 4 (four) hours as needed for mild pain.   04/26/2023   apixaban  (ELIQUIS ) 5 MG TABS tablet Take 1 tablet (5 mg total) by mouth 2 (two) times daily. 180 tablet 3 04/26/2023 at  6:00 PM   aspirin  EC 81 MG tablet Take 1 tablet (81 mg total) by mouth daily. Swallow whole. 30 tablet 2 04/25/2023   atorvastatin  (LIPITOR ) 80 MG tablet TAKE 1 TABLET BY MOUTH AT BEDTIME (Patient taking differently: Take 80 mg by mouth daily.) 90 tablet 3 04/26/2023   carvedilol  (COREG ) 12.5 MG tablet Take 1 tablet (12.5 mg total) by mouth 2 (two) times daily. 180 tablet 3 04/26/2023  cinacalcet  (SENSIPAR ) 30 MG tablet Take 1 tablet (30 mg total) by mouth Every Tuesday,Thursday,and Saturday with dialysis. 60 tablet  04/27/2023   clopidogrel  (PLAVIX ) 75 MG tablet Take 1 tablet (75 mg total) by mouth daily with breakfast. 30 tablet 2 04/26/2023   Continuous Glucose Receiver (FREESTYLE LIBRE 3 READER) DEVI Use to check blood glucose TID 1 each 0 Taking   Continuous Glucose Sensor (FREESTYLE LIBRE 3 PLUS SENSOR) MISC Change sensor every 15 days. Use as directed to check glucose daily 6 each 3 Taking   gabapentin  (NEURONTIN ) 300 MG capsule Take 1 capsule (300 mg total) by mouth at bedtime. 30 capsule 2 04/26/2023 Bedtime   HUMALOG  KWIKPEN 200 UNIT/ML KwikPen INJECT A MAXIMUM OF 28 UNITS SUBCUTANEOUSLY TWICE DAILY WITH LUNCH AND SUPPER PER SLIDING SCALE. APPOINTMENT REQUIRED FOR FUTURE REFILLS 6 mL 5  04/26/2023   LANTUS  SOLOSTAR 100 UNIT/ML Solostar Pen INJECT 12 UNITS SUBCUTANEOUSLY AT BEDTIME (Patient taking differently: Inject 14 Units into the skin at bedtime.) 15 mL 0 04/26/2023   midodrine  (PROAMATINE ) 10 MG tablet Take 10 mg by mouth 3 (three) times a week. EVERY TUESDAY, THURSDAY, AND SATURDAY   04/27/2023   multivitamin (RENA-VIT) TABS tablet Take 1 tablet by mouth once daily (Patient taking differently: Take 1 tablet by mouth daily.) 10 tablet 3 04/25/2023   nitroGLYCERIN  (NITROSTAT ) 0.4 MG SL tablet Place 1 tablet (0.4 mg total) under the tongue every 5 (five) minutes as needed for chest pain. 30 tablet 1 Unknown   Olopatadine  HCl 0.2 % SOLN Place 1 drop into both eyes daily as needed (for allergies).    Unknown   sevelamer  (RENAGEL ) 800 MG tablet Take 1,600 mg by mouth 3 (three) times daily with meals.   04/26/2023   VENTOLIN  HFA 108 (90 Base) MCG/ACT inhaler Inhale 1-2 puffs into the lungs every 6 (six) hours as needed for wheezing or shortness of breath. 18 g 1 Unknown   glucose blood test strip Check 1 time daily. E11.9 One Touch Ultra Blue Test Strips 100 each 3    Insulin  Pen Needle (BD PEN NEEDLE NANO U/F) 32G X 4 MM MISC USE 1 PEN NEEDLE SUBCUTANEOUSLY WITH INSULIN  4 TIMES DAILY 400 each 0     Assessment: 63 y.o. M presents with Hgb 5.4, FOBT+. Pt noted some melena since PCI (4/4) - pt has been on ASA 81, plavix , and Eliquis  pta for afib and recent PCI. Eliquis  held since admission. Last dose 4/16 1800. Hgb improved to 8.4 (required 4 units PRBC this admission). EGD showed nonbleeding duodenal ulcers.   Okay to begin heparin  per cardiology (as pt went into afib overnight) - will aim for low end of therapeutic range and no boluses with recent GI bleed. Eliquis  likely still affecting heparin  level so will utilize aPTT until levels correlating.  Goal of Therapy:  Heparin  level 0.3-0.5 units/ml aPTT 66-85 seconds Monitor platelets by anticoagulation protocol: Yes   Plan:  Heparin   1000 units/hr. No bolus. Will f/u 8hr aPTT and heparin  level Daily heparin  level, aPTT, and CBC F/u Hgb and for any s/s bleeding  Enrigue Harvard, PharmD, BCPS Please see amion for complete clinical pharmacist phone list 04/30/2023,10:34 AM

## 2023-04-30 NOTE — Significant Event (Addendum)
 Rapid Response Event Note   Reason for Call :  Afib RVR/hypotension  Pt had HD today with 2L UF.  Per RN, pt went into Afib RVR 120-140s around 1830 tonight. Around 2245, he developed hypotension and some SOB.  Interventions done:  2136-EKG-Afib RVR, L axis deviation, L BBB 2233-150mg  amiodarone  bolus 2321-250cc NS bolus, 10mg  midodrine  po 2358-amiod gtt started at 60mg /hr  Initial Focused Assessment:  Pt lying in bed with eyes open, in no visible distress. He is alert and oriented, c/o mild SOB. He denies CP/dizziness. Lungs clear with scattered crackles in the bases. Skin is cool and clammy.   HR-115, BP-92/55, RR-20, SpO2-98% on 3L [place]  Interventions:  CBC LA PCXR Additional 250cc NS bolus if needed Plan of Care:  Await lab results. Give additional bolus if needed. Continue to monitor pt closely. Please call RRT if further assistance needed.  Event Summary:   MD Notified: Dr. Gerhardt Knudsen, Dr. Henderson(cards MD) Call Time:2332 Arrival Time:2340 End Time:0026   0053-SBP-50s. Additional 250cc NS bolus(from above) started. RRT to bedside. BP continued to drop as well as SpO2-mid 80s. Pt placed on NRB>SpO2-100% and pt started on levophed  gtt. PCCM consulted. Pt to be txed to ICU(3M12).    Dorrine Gaudy, RN

## 2023-04-30 NOTE — Plan of Care (Signed)

## 2023-04-30 NOTE — Progress Notes (Signed)
 Progress Note  Patient Name: Anthony Bullock Date of Encounter: 04/30/2023  Primary Cardiologist: Armida Lander, MD  Subjective   Overnight, he was transferred to ICU for A-fib with RVR.  Started on amiodarone  drip, converted to NSR early this morning.  He is doing well.  No symptoms.  Hemoglobin stable.  He was started on aspirin  81 mg yesterday, tolerated very well along with Plavix  75 mg once daily.  Inpatient Medications    Scheduled Meds:  sodium chloride    Intravenous Once   sodium chloride    Intravenous Once   amiodarone   200 mg Oral BID   atorvastatin   80 mg Oral QPM   Chlorhexidine  Gluconate Cloth  6 each Topical Daily   Chlorhexidine  Gluconate Cloth  6 each Topical Q0600   Chlorhexidine  Gluconate Cloth  6 each Topical Q0600   cinacalcet   30 mg Oral Q T,Th,Sa-HD   clopidogrel   75 mg Oral Daily   fidaxomicin   200 mg Oral BID   gabapentin   300 mg Oral QHS   insulin  aspart  0-15 Units Subcutaneous TID WC   insulin  glargine-yfgn  14 Units Subcutaneous Daily   midodrine   10 mg Oral Q8H   multivitamin  1 tablet Oral QHS   pantoprazole  (PROTONIX ) IV  40 mg Intravenous Q6H   Followed by   pantoprazole  (PROTONIX ) IV  40 mg Intravenous Q12H   sevelamer  carbonate  1,600 mg Oral TID WC   sodium chloride  flush  3 mL Intravenous Q12H   Continuous Infusions:  norepinephrine  (LEVOPHED ) Adult infusion 5 mcg/min (04/30/23 0800)   PRN Meds: acetaminophen  **OR** acetaminophen , albuterol , ondansetron  **OR** ondansetron  (ZOFRAN ) IV, mouth rinse, sodium chloride  flush   Vital Signs    Vitals:   04/30/23 0730 04/30/23 0745 04/30/23 0800 04/30/23 0815  BP: (!) 126/45 (!) 119/48 (!) 131/53 (!) 137/56  Pulse: 75 68 85 82  Resp: 19 17 19  (!) 27  Temp: 98.2 F (36.8 C)     TempSrc: Axillary     SpO2: 96% 94% 95% 98%  Weight:      Height:        Intake/Output Summary (Last 24 hours) at 04/30/2023 1009 Last data filed at 04/30/2023 0800 Gross per 24 hour  Intake 1049.55 ml   Output 1 ml  Net 1048.55 ml   Filed Weights   04/27/23 0347 04/29/23 1311 04/29/23 1636  Weight: 89.4 kg 92.8 kg 91.4 kg    Telemetry     Personally reviewed. NSR.  ECG    Not obtained today  Physical Exam   GEN: No acute distress.   Neck: No JVD. Cardiac: RRR, no murmur, rub, or gallop.  Respiratory: Nonlabored. Clear to auscultation bilaterally. GI: Soft, nontender, bowel sounds present. MS: No edema; No deformity. Neuro:  Nonfocal. Psych: Alert and oriented x 3. Normal affect.  Labs    Chemistry Recent Labs  Lab 04/27/23 0404 04/28/23 0639 04/29/23 0437 04/30/23 0131 04/30/23 0440  NA 130* 130* 132* 133* 121*  K 3.5 4.2 4.0 3.7 3.5  CL 91* 91* 95* 96* 86*  CO2 19* 18* 20* 24 20*  GLUCOSE 197* 273* 252* 148* 561*  BUN 95* 55* 73* 40* 38*  CREATININE 8.20* 5.93* 7.41* 5.29* 5.09*  CALCIUM  8.3* 8.1* 8.3* 8.1* 7.3*  PROT 5.9*  --   --   --   --   ALBUMIN  2.6* 2.3* 2.1*  --  2.0*  AST 28  --   --   --   --   ALT  22  --   --   --   --   ALKPHOS 62  --   --   --   --   BILITOT 0.4  --   --   --   --   GFRNONAA 7* 10* 8* 12* 12*  ANIONGAP 20* 21* 17* 13 15     Hematology Recent Labs  Lab 04/29/23 0437 04/29/23 1333 04/30/23 0127  WBC 26.7* 17.7* 17.4*  RBC 2.87* 2.79* 2.80*  HGB 8.7* 8.4* 8.4*  HCT 25.3* 25.0* 25.2*  MCV 88.2 89.6 90.0  MCH 30.3 30.1 30.0  MCHC 34.4 33.6 33.3  RDW 15.8* 16.0* 16.5*  PLT 192 185 175    Cardiac Enzymes Recent Labs  Lab 04/11/23 0933 04/12/23 0656 04/12/23 0817 04/27/23 0404 04/27/23 0657  TROPONINIHS 16,311* 12,078* 11,346* 345* 387*    BNPNo results for input(s): "BNP", "PROBNP" in the last 168 hours.   DDimerNo results for input(s): "DDIMER" in the last 168 hours.   Radiology    DG CHEST PORT 1 VIEW Result Date: 04/30/2023 CLINICAL DATA:  Rapid response for hypotension and shortness of breath. EXAM: PORTABLE CHEST 1 VIEW COMPARISON:  04/10/2023 FINDINGS: Right IJ CVC tip in the right atrium.  Cardiomegaly. Left chest wall ICD. Aortic atherosclerotic calcification. Pulmonary vascular congestion. Left lower lung airspace opacities. Small bilateral pleural effusions. No pneumothorax. No displaced rib fractures. IMPRESSION: 1. Cardiomegaly with pulmonary vascular congestion and small bilateral pleural effusions. 2. Left lower lung airspace opacities, atelectasis versus pneumonia. Electronically Signed   By: Rozell Cornet M.D.   On: 04/30/2023 01:13    Assessment & Plan     Atrial fibrillation with RVR: He had history of A-fib during COVID and last night, he became hypotensive and went into A-fib with RVR.  Transferred to ICU, started on amiodarone  drip.  He converted to normal sinus rhythm earlier this morning.  Will stop amiodarone  drip and start amiodarone  200 mg twice daily for 3 weeks followed by 200 mg once daily.  Not on rate controlling agents due to hypotension.  Acute blood loss anemia: Melena since PCI. S/p PRBC transfusion this admission.  EGD revealed nonbleeding duodenal ulcers.  Hemoglobin stable.  Keep hemoglobin more than 8.  CAD manifested by NSTEMI s/p OM PCI and bifurcation stenting of LAD/D1, staged RCA PCI with residual critical Lcx disease with LVEF 40%: Due to A-fib with RVR last night on amiodarone  drip and currently in NSR, he will need systemic AC due to pharmacological conversion.  Will start heparin  drip, will closely monitor for any active bleeding and drop in hemoglobin. CBC every 12 hours (probably better if collected in pediatric tube).  If any drop in hemoglobin or active bleeding, need to stop heparin  drip and transfuse PRBC.  Stop aspirin .  Continue Plavix  75 mg once daily.  He might be a candidate for left atrial appendage closure device implantation in the future provided he can tolerate at least antiplatelet therapy.  NICM LVEF 40 to 45%: Compensated.  ESRD dialysis dependent, not on GDMT, on midodrine  for BP support.  PAD, bilateral BKA: Cardioprotective  medications as stated above.   CRITICAL CARE Performed by: Kennyth Pean Ebin Palazzi   Total critical care time: 30 minutes  Critical care time was exclusive of separately billable procedures and treating other patients.  Critical care was necessary to treat or prevent imminent or life-threatening deterioration.  Critical care was time spent personally by me on the following activities: development of treatment plan with patient and/or  surrogate as well as nursing, discussions with consultants, evaluation of patient's response to treatment, examination of patient, obtaining history from patient or surrogate, ordering and performing treatments and interventions, ordering and review of laboratory studies, ordering and review of radiographic studies, pulse oximetry and re-evaluation of patient's condition.    Signed, Rhyann Berton Babs Less, MD  04/30/2023, 10:09 AM

## 2023-04-30 NOTE — Progress Notes (Signed)
 [facility] ROUNDING NOTE   Subjective: No BM since yesterday.  Hemoglobin is stable. He had rapid A-fib and was transferred to ICU last night,   Objective: Vital signs in last 24 hours: Temp:  [97.6 F (36.4 C)-98.4 F (36.9 C)] 97.9 F (36.6 C) (04/20 1104) Pulse Rate:  [58-139] 82 (04/20 0815) Resp:  [0-38] 27 (04/20 0815) BP: (37-154)/(16-117) 137/56 (04/20 0815) SpO2:  [81 %-100 %] 98 % (04/20 0815) FiO2 (%):  [32 %-100 %] 100 % (04/20 0113) Weight:  [91.4 kg-92.8 kg] 91.4 kg (04/19 1636) Last BM Date : 04/29/23 General: NAD Abdomen: Soft, no distention or tenderness Bilateral BKA    Intake/Output from previous day: 04/19 0701 - 04/20 0700 In: 978.9 [I.V.:178.8; IV Piggyback:800.1] Out: 1  Intake/Output this shift: Total I/O In: 70.7 [I.V.:70.7] Out: -    Lab Results: Recent Labs    04/29/23 0437 04/29/23 1333 04/30/23 0127  WBC 26.7* 17.7* 17.4*  HGB 8.7* 8.4* 8.4*  PLT 192 185 175  MCV 88.2 89.6 90.0   BMET Recent Labs    04/30/23 0131 04/30/23 0440 04/30/23 0936  NA 133* 121* 133*  K 3.7 3.5 3.9  CL 96* 86* 93*  CO2 24 20* 23  GLUCOSE 148* 561* 234*  BUN 40* 38* 45*  CREATININE 5.29* 5.09* 5.93*  CALCIUM  8.1* 7.3* 8.3*   LFT Recent Labs    04/29/23 0437 04/30/23 0440 04/30/23 0936  PROT  --   --  5.9*  ALBUMIN  2.1* 2.0* 2.2*  AST  --   --  47*  ALT  --   --  25  ALKPHOS  --   --  71  BILITOT  --   --  0.8   PT/INR No results for input(s): "INR" in the last 72 hours.    Imaging/Other results: DG CHEST PORT 1 VIEW Result Date: 04/30/2023 CLINICAL DATA:  Rapid response for hypotension and shortness of breath. EXAM: PORTABLE CHEST 1 VIEW COMPARISON:  04/10/2023 FINDINGS: Right IJ CVC tip in the right atrium. Cardiomegaly. Left chest wall ICD. Aortic atherosclerotic calcification. Pulmonary vascular congestion. Left lower lung airspace opacities. Small bilateral pleural effusions. No pneumothorax. No displaced rib  fractures. IMPRESSION: 1. Cardiomegaly with pulmonary vascular congestion and small bilateral pleural effusions. 2. Left lower lung airspace opacities, atelectasis versus pneumonia. Electronically Signed   By: Rozell Cornet M.D.   On: 04/30/2023 01:13      Assessment &Plan  63 year old very pleasant gentleman with history of CAD s/p recent PCI, end-stage renal disease on hemodialysis, peripheral vascular disease, C. difficile colitis Rapid A-fib, transferred to ICU on midodrine  and amiodarone   Status post EGD with superficial duodenal ulcers, no high risk lesions.   Continue Plavix , currently on heparin  gtt, can transition to Eliquis  if hemoglobin remains stable with no further bleeding Continue PPI twice daily Monitor hemoglobin and transfuse as needed   C. difficile colitis: Continue fidaxomicin  to complete 10-day course Continue diet as tolerated   GI signing off, please call with any questions    K. Patrick Boor , MD [phone]  [facility]

## 2023-04-30 NOTE — Evaluation (Signed)
 Physical Therapy Evaluation Patient Details Name: Anthony Bullock MRN: [id number] DOB: 04/03/1960 Today's Date: 04/30/2023  History of Present Illness  63 yo male who presented on 4/17 with N/V x 3days, Hgb 5.6; C Diff positive as well; Diagnosed with GI Bleed and underwent EGD, duodenal ulcer; late 4/19 pt went to ICU for afib with RVR and significant hypotension (received amioderon, and midodrine ); recent admission 4/01 to the emergency room with acute onset of dyspnea and a feeling of fluid overload as if he needs dialysis and midsternal chest pain felt as pressure; Ruled in for NSTEMI; To Cath Lab on 4/2 for PCI; with medical history significant for osteoarthritis, chronic systolic CHF, COPD, OSA-severe, type 2 diabetes mellitus with diabetic neuropathy, end-stage renal disease on hemodialysis on TTS, hypertension, PVD, s/p bilateral BKA, dyslipidemia, urolithiasis and nonischemic cardiomyopathy, Paroxysmal A fib, C diff history  Clinical Impression   Pt admitted with above diagnosis. Lives at home with wife and mother-in-law, in a home with a ramped entrance; Prior to admission, pt was able to manage overall modified independently, walking household distances with bil prostheses and his specialized quad cane; Presents to PT with generalized weakness, decr activity tolerance, a decline from his normally modified independent functional baseline; currently needs contact guard for transfers and ambulation for safety; Anticipate dc home -- worth considering Outpt PT to work back to more functional strength and endurance, also to address cervical stretches;  Pt currently with functional limitations due to the deficits listed below (see PT Problem List). Pt will benefit from skilled PT to increase their independence and safety with mobility to allow discharge to the venue listed below.           If plan is discharge home, recommend the following: A little help with walking and/or transfers;A little help with  bathing/dressing/bathroom;Assist for transportation   Can travel by private vehicle        Equipment Recommendations None recommended by PT  Recommendations for Other Services  OT consult    Functional Status Assessment Patient has had a recent decline in their functional status and demonstrates the ability to make significant improvements in function in a reasonable and predictable amount of time.     Precautions / Restrictions Precautions Precautions: Fall (fall risk is present, but minimal) Precaution/Restrictions Comments: Bil BKAs; Prostheses in room, pt can don with setup assist Restrictions Weight Bearing Restrictions Per Provider Order: No      Mobility  Bed Mobility Overal bed mobility: Needs Assistance Bed Mobility: Supine to Sit     Supine to sit: Supervision     General bed mobility comments: Supervision for safety and line management    Transfers Overall transfer level: Needs assistance Equipment used: Quad cane (pt's own) Transfers: Sit to/from Stand, Bed to chair/wheelchair/BSC Sit to Stand: Contact guard assist   Step pivot transfers: Contact guard assist       General transfer comment: Stood from bedside with close guard for safety, but no physical assist needed; braced back s of LE against bed for stabiltiy; Smoother rise from recliner, using armrests to push up    Ambulation/Gait Ambulation/Gait assistance: Contact guard assist Gait Distance (Feet): 75 Feet (x2) Assistive device: Quad cane, IV Pole (Pt's won quad cane in L hand, and pushing IV pole with R hand) Gait Pattern/deviations: Step-through pattern Gait velocity: slowed     General Gait Details: Wide steps, adn noted pt benefitted from bil UE support (quad cane and pushing IV pole)  Stairs  Wheelchair Mobility     Tilt Bed    Modified Rankin (Stroke Patients Only)       Balance                                             Pertinent  Vitals/Pain Pain Assessment Pain Assessment: Faces Faces Pain Scale: Hurts a little bit Pain Location: neck stiffness and occasional discofort Pain Descriptors / Indicators: Tightness Pain Intervention(s): Other (comment) (Gave suggestions for stretches)    Home Living Family/patient expects to be discharged to:: Private residence Living Arrangements: Spouse/significant other Available Help at Discharge: Family;Available 24 hours/day Type of Home: House Home Access: Ramped entrance       Home Layout: One level Home Equipment: Agricultural consultant (2 wheels);Wheelchair - manual;BSC/3in1;Other (comment);Rollator (4 wheels);Cane - quad (slide board; specialized quad cane that supports wrist)      Prior Function Prior Level of Function : Independent/Modified Independent             Mobility Comments: Quad cane all the time for mobility ADLs Comments: ind, manages own meds, spouse provides transportation     Extremity/Trunk Assessment   Upper Extremity Assessment Upper Extremity Assessment: Overall WFL for tasks assessed (Some weakness, but overall able to perform ADLs with setup, including donning prostheses -- took a few pauses/breaks)    Lower Extremity Assessment Lower Extremity Assessment: Generalized weakness (Bil BKAs; able to stand and walk typical household distance on prostheses)    Cervical / Trunk Assessment Cervical / Trunk Assessment: Other exceptions Cervical / Trunk Exceptions: forward head and shoulders posture; tightness in upper cervical suboccipital muscles  Communication   Communication Communication: No apparent difficulties    Cognition Arousal: Alert Behavior During Therapy: WFL for tasks assessed/performed   PT - Cognitive impairments: No apparent impairments                         Following commands: Intact       Cueing Cueing Techniques: Verbal cues     General Comments General comments (skin integrity, edema, etc.): Intiaited  session on room air, and noted mild SOB while donning prostheses sitting up; BP with normal response to increased activity; no reorts of dizziness or presyncopal symptoms    Exercises Other Exercises Other Exercises: Upper cervical stretches, small nods with chin pulled back Other Exercises: L and R lateral neck flexion with gentle rotation, chin to collar bone, chin to ceiling   Assessment/Plan    PT Assessment Patient needs continued PT services  PT Problem List Decreased strength;Decreased range of motion;Decreased activity tolerance;Decreased balance;Decreased mobility;Decreased knowledge of precautions;Pain;Cardiopulmonary status limiting activity       PT Treatment Interventions DME instruction;Gait training;Stair training;Functional mobility training;Therapeutic activities;Therapeutic exercise;Balance training;Neuromuscular re-education;Patient/family education    PT Goals (Current goals can be found in the Care Plan section)  Acute Rehab PT Goals Patient Stated Goal: Hopes to get home soon PT Goal Formulation: With patient Time For Goal Achievement: 05/14/23 Potential to Achieve Goals: Good    Frequency Min 3X/week     Co-evaluation               AM-PAC PT "6 Clicks" Mobility  Outcome Measure Help needed turning from your back to your side while in a flat bed without using bedrails?: None Help needed moving from lying on your back to sitting on the  side of a flat bed without using bedrails?: None Help needed moving to and from a bed to a chair (including a wheelchair)?: A Little Help needed standing up from a chair using your arms (e.g., wheelchair or bedside chair)?: A Little Help needed to walk in hospital room?: A Little Help needed climbing 3-5 steps with a railing? : A Lot 6 Click Score: 19    End of Session Equipment Utilized During Treatment: Gait belt;Oxygen  Activity Tolerance: Patient tolerated treatment well Patient left: in chair;with call bell/phone  within reach;with chair alarm set Nurse Communication: Mobility status PT Visit Diagnosis: Other abnormalities of gait and mobility (R26.89);Muscle weakness (generalized) (M62.81)    Time: 1610-9604 PT Time Calculation (min) (ACUTE ONLY): 45 min   Charges:   PT Evaluation $PT Eval Moderate Complexity: 1 Mod PT Treatments $Gait Training: 8-22 mins $Therapeutic Activity: 8-22 mins PT General Charges $$ ACUTE PT VISIT: 1 Visit         Darcus Eastern, PT  Acute Rehabilitation Services Office [phone] Secure Chat welcomed   Marcial Setting 04/30/2023, 9:57 AM

## 2023-04-30 NOTE — Progress Notes (Signed)
 eLink Physician-Brief Progress Note Patient Name: Anthony Bullock DOB: 02/01/1960 MRN: [id number]   Date of Service  04/30/2023  HPI/Events of Note  63 year old with a history of COPD, OSA, diabetes and ESRD on hemodialysis with PVD initially presented with dyspnea found to have an upper GI bleed.  Transferred to the ICU on 4/20 for atrial fibrillation complicated by shock.  Heart rates have improved, blood pressure still low requiring norepinephrine  infusion.  On amiodarone  infusion.  Results show mild hypokalemia, elevated creatinine, leukocytosis and stable hemoglobin.  Blood cultures been drawn.  eICU Interventions  Maintain amiodarone  infusion, norepinephrine  to maintain MAP greater than 65  Status post almost 1 L bolus and additional midodrine   DVT prophylaxis with SCDs, chemoprophylaxis held in setting of bleeding GI prophylaxis with therapeutic pantoprazole      Intervention Category Evaluation Type: New Patient Evaluation  Maryssa Giampietro 04/30/2023, 3:19 AM

## 2023-04-30 NOTE — Consult Note (Signed)
 NAME:  Anthony Bullock, MRN:  [id number], DOB:  1961-01-01, LOS: 3 ADMISSION DATE:  04/27/2023, CONSULTATION DATE:  04/30/2023 REFERRING MD:  Corrinne Din, MD, CHIEF COMPLAINT:  Hypotension/A. Fib with RVR  History of Present Illness:  Anthony Bullock is a 63 y.o. male with medical history significant for osteoarthritis, chronic systolic CHF, COPD, OSA-severe, type 2 diabetes mellitus with diabetic neuropathy, end-stage renal disease on hemodialysis on TTS, hypertension, PVD, s/p bilateral BKA, dyslipidemia, urolithiasis and nonischemic cardiomyopathy, Paroxysmal A fib, C diff history who presented on 4/01 to the emergency room with acute onset of dyspnea and a feeling of fluid overload as if he needs dialysis and midsternal chest pain felt as pressure. He ruled in for NSTEMI.  He was taken to cath lab and underwent staged PCI on 04/02, s/p OM PCI and bifurcation stenting of LAD/D1, staged RCA PCI with residual critical Lcx disease with LVEF 40%: On triple therapy after PCI, aspirin  81 mg once daily, Plavix  75 mg once daily and Eliquis  5 mg twice daily.  He came back to ED on 04/17 with N/V/D x 3 days.  Vomiting with coffee grounds and stools were dark. His HgB was 5.6 and he was transfused PRBC.  He was also found to be C diff positive and started on Vancomycin .  He had HD yesterday and approximately 2 liters were removed. Given hypotension his Coreg  and anticoagulants were held. Unfortunately, he had a GI bleed and underwent EGD showing a non-bleeding duodenal ulcer. Tonight he was a rapid response and went into A fib with RVR, rates to 140's and became hypotensive.  He was given 250cc x 2 fluid boluses and 10mg  po Midodrine .  Amiodarone  drip started and given his hypotension, Levophed  was also started.  Cardiology was notified. Patient denies chest pains or abd pains.  He has mild SOB and was found to be on 100%NRM upon my seeing him on the floor.  Pertinent  Medical History  osteoarthritis, chronic  systolic CHF, COPD, OSA-severe, type 2 diabetes mellitus with diabetic neuropathy, end-stage renal disease on hemodialysis on TTS, hypertension, PVD, s/p bilateral BKA, dyslipidemia, urolithiasis and nonischemic cardiomyopathy, Paroxysmal A fib, C diff, CAD-staged PCI on 04/02, s/p OM PCI and bifurcation stenting of LAD/D1, staged RCA PCI with residual critical Lcx disease with LVEF 40%  Significant Hospital Events: Including procedures, antibiotic start and stop dates in addition to other pertinent events   4/17-admitted with UGIB 4/20-transferred to ICU for hypotension, A fib with RVR  Interim History / Subjective:  N/a, as above  Objective   Blood pressure (!) 74/46, pulse 98, temperature 97.9 F (36.6 C), temperature source Oral, resp. rate 18, height 5\' 10"  (1.778 m), weight 91.4 kg, SpO2 100%.    FiO2 (%):  [32 %-100 %] 100 % PEEP:  [6 cmH20] 6 cmH20   Intake/Output Summary (Last 24 hours) at 04/30/2023 0238 Last data filed at 04/30/2023 0200 Gross per 24 hour  Intake 606.63 ml  Output 1 ml  Net 605.63 ml   Filed Weights   04/27/23 0347 04/29/23 1311 04/29/23 1636  Weight: 89.4 kg 92.8 kg 91.4 kg    Examination: General: Alert and awake and in NAD HENT: pupils reactive no icterus EOMI Lungs: few basilar creps, no wheezes no rhonchi Cardiovascular: irreg/irreg, tachy, no murmurs Abdomen: soft nt nd bs pos Extremities: no cyanosis, edema, cool but not cold and b/l BKAs Neuro: AAO x 3 CN II to XII grossly intact   Resolved Hospital Problem list  N/a  Assessment & Plan:  Hypotension  -Unclear cause of hypotension, as HgB is 8.4 and relatively stable  -A fib with RVR, on Amiodarone  and rates have improved to low 100's  -No indication for electric cardioversion  -no melanotic stools since yesterday morning  -currently being supported by Levophed  at 3 mcg  -transferring to ICU UGIB  -No BRBPR, No Melanotic stools since yesterday  -Continue PPI, Octreotide to be added  if he doses start bleeding Recent coronary stents/staged PCI on anticoagulation  --Per cards recommendation, stop Eliquis  and ASA, continue Plavix  ESRD on HD  -2 liters removed yesterday  C diff colitis  -Per GI-switch from oral vancomycin  to Fidaxomicin  CHFrEF  -Unable to give large fluid bolus to support BP  -Continue with Levophed  A fib with RVR  -improvements in rate with Amiodarone  drip  Best Practice (right click and "Reselect all SmartList Selections" daily)   Diet/type: NPO w/ meds via tube DVT prophylaxis not indicated, GIB Pressure ulcer(s): N/A GI prophylaxis: PPI Lines: N/A Foley:  N/A Code Status:  full code  Labs   CBC: Recent Labs  Lab 04/28/23 0640 04/28/23 1117 04/28/23 1820 04/29/23 0437 04/29/23 1333 04/30/23 0127  WBC 32.3* 30.3*  --  26.7* 17.7* 17.4*  NEUTROABS  --   --   --   --  16.2* 14.4*  HGB 6.4* 7.5* 9.8* 8.7* 8.4* 8.4*  HCT 19.4* 22.6* 28.2* 25.3* 25.0* 25.2*  MCV 91.9 91.1  --  88.2 89.6 90.0  PLT 210 185  --  192 185 175    Basic Metabolic Panel: Recent Labs  Lab 04/27/23 0404 04/28/23 0639 04/29/23 0437 04/30/23 0131  NA 130* 130* 132* 133*  K 3.5 4.2 4.0 3.7  CL 91* 91* 95* 96*  CO2 19* 18* 20* 24  GLUCOSE 197* 273* 252* 148*  BUN 95* 55* 73* 40*  CREATININE 8.20* 5.93* 7.41* 5.29*  CALCIUM  8.3* 8.1* 8.3* 8.1*  PHOS  --  5.6* 5.1*  --    GFR: Estimated Creatinine Clearance: 16.7 mL/min (A) (by C-G formula based on SCr of 5.29 mg/dL (H)). Recent Labs  Lab 04/28/23 0637 04/28/23 0640 04/28/23 1117 04/29/23 0437 04/29/23 1333 04/30/23 0011 04/30/23 0127  WBC  --    < > 30.3* 26.7* 17.7*  --  17.4*  LATICACIDVEN 3.3*  --  2.3*  --   --  1.3  --    < > = values in this interval not displayed.    Liver Function Tests: Recent Labs  Lab 04/27/23 0404 04/28/23 0639 04/29/23 0437  AST 28  --   --   ALT 22  --   --   ALKPHOS 62  --   --   BILITOT 0.4  --   --   PROT 5.9*  --   --   ALBUMIN  2.6* 2.3* 2.1*    Recent Labs  Lab 04/27/23 0404  LIPASE 34   No results for input(s): "AMMONIA" in the last 168 hours.  ABG    Component Value Date/Time   PHART 7.33 (L) 03/07/2023 0006   PCO2ART 34 03/07/2023 0006   PO2ART 41 (L) 03/07/2023 0006   HCO3 17.9 (L) 03/07/2023 0006   TCO2 26 08/05/2022 1806   ACIDBASEDEF 7.2 (H) 03/07/2023 0006   O2SAT 69.2 03/07/2023 0006     Coagulation Profile: No results for input(s): "INR", "PROTIME" in the last 168 hours.  Cardiac Enzymes: No results for input(s): "CKTOTAL", "CKMB", "CKMBINDEX", "TROPONINI" in the last 168 hours.  HbA1C: Hemoglobin A1C  Date/Time Value Ref Range Status  03/20/2023 02:58 PM 6.6 (A) 4.0 - 5.6 % Final  12/21/2022 04:33 PM 7.3 (A) 4.0 - 5.6 % Final  02/15/2018 12:00 AM 11.6  Final    Comment:    [facility] in [place]   Hgb A1c MFr Bld  Date/Time Value Ref Range Status  11/13/2019 04:43 PM 6.5 (H) 4.8 - 5.6 % Final    Comment:    (NOTE) Pre diabetes:          5.7%-6.4%  Diabetes:              >6.4%  Glycemic control for   <7.0% adults with diabetes   07/10/2019 04:44 AM 8.1 (H) 4.8 - 5.6 % Final    Comment:    (NOTE)         Prediabetes: 5.7 - 6.4         Diabetes: >6.4         Glycemic control for adults with diabetes: <7.0     CBG: Recent Labs  Lab 04/28/23 2114 04/29/23 0833 04/29/23 1225 04/29/23 1729 04/29/23 2130  GLUCAP 267* 326* 279* 138* 128*    Review of Systems:   Denies chest or abdominal pain  Past Medical History:  He,  has a past medical history of AICD (automatic cardioverter/defibrillator) present, Allergic rhinitis, Anemia, Arthritis, Chronic systolic heart failure (HCC), COPD (chronic obstructive pulmonary disease) (HCC), Diabetes mellitus type II, Diabetic nephropathy (HCC), Diabetic neuropathy (HCC), ESRD on hemodialysis (HCC), History of cardiac catheterization, History of kidney stones, Hyperlipidemia, Hypertension, Kidney stones, NICM (nonischemic cardiomyopathy) (HCC),  Osteomyelitis (HCC), Pneumonia, Urethral stricture, and Wears glasses.   Surgical History:   Past Surgical History:  Procedure Laterality Date   ABDOMINAL AORTOGRAM W/LOWER EXTREMITY N/A 03/30/2016   Procedure: Abdominal Aortogram w/Lower Extremity;  Surgeon: Dannis Dy, MD;  [facility] INVASIVE CV LAB;  Service: Cardiovascular;  Laterality: N/A;   AMPUTATION Right 04/26/2016   Procedure: Right Below Knee Amputation;  Surgeon: Timothy Ford, MD;  Location: [facility] OR;  Service: Orthopedics;  Laterality: Right;   AMPUTATION Left 08/21/2019   Procedure: LEFT FOOT 5TH RAY AMPUTATION;  Surgeon: Timothy Ford, MD;  Location: [facility] OR;  Service: Orthopedics;  Laterality: Left;   AMPUTATION Left 11/13/2019   Procedure: LEFT BELOW KNEE AMPUTATION;  Surgeon: Timothy Ford, MD;  Location: [facility] OR;  Service: Orthopedics;  Laterality: Left;   AV FISTULA PLACEMENT Right 09/08/2015   Procedure: INSERTION OF 4-29mm x 45cm  ARTERIOVENOUS (AV) GORE-TEX GRAFT RIGHT UPPER  ARM;  Surgeon: Dannis Dy, MD;  Location: [facility] OR;  Service: Vascular;  Laterality: Right;   AV FISTULA PLACEMENT Left 01/14/2016   Procedure: CREATION OF LEFT UPPER ARM ARTERIOVENOUS FISTULA;  Surgeon: Dannis Dy, MD;  Location: [facility] OR;  Service: Vascular;  Laterality: Left;   BASCILIC VEIN TRANSPOSITION Right 08/22/2014   Procedure: RIGHT UPPER ARM BASCILIC VEIN TRANSPOSITION;  Surgeon: Dannis Dy, MD;  Location: [facility] OR;  Service: Vascular;  Laterality: Right;   BELOW KNEE LEG AMPUTATION Right 04/26/2016   CARDIAC CATHETERIZATION     CARDIAC DEFIBRILLATOR PLACEMENT  06/27/2013   Sub Q       BY DR Rodolfo Clan   CATARACT EXTRACTION W/PHACO Right 08/06/2018   Procedure: CATARACT EXTRACTION PHACO AND INTRAOCULAR LENS PLACEMENT (IOC);  Surgeon: Tarri Farm, MD;  Location: [facility] ORS;  Service: Ophthalmology;  Laterality: Right;  CDE: 4.06   CATARACT EXTRACTION W/PHACO Left 08/20/2018  Procedure: CATARACT EXTRACTION PHACO AND  INTRAOCULAR LENS PLACEMENT (IOC);  Surgeon: Tarri Farm, MD;  Location: [facility] ORS;  Service: Ophthalmology;  Laterality: Left;  CDE: 6.76   COLONOSCOPY WITH PROPOFOL  N/A 07/22/2015   Procedure: COLONOSCOPY WITH PROPOFOL ;  Surgeon: Albertina Hugger, MD;  Location: [facility] ENDOSCOPY;  Service: Gastroenterology;  Laterality: N/A;   CORONARY LITHOTRIPSY N/A 04/14/2023   Procedure: CORONARY LITHOTRIPSY;  Surgeon: Arleen Lacer, MD;  Location: [facility] INVASIVE CV LAB;  Service: Cardiovascular;  Laterality: N/A;  RCA   CORONARY STENT INTERVENTION N/A 04/12/2023   Procedure: CORONARY STENT INTERVENTION;  Surgeon: Swaziland, Peter M, MD;  Location: [facility] INVASIVE CV LAB;  Service: Cardiovascular;  Laterality: N/A;   CORONARY STENT INTERVENTION N/A 04/14/2023   Procedure: CORONARY STENT INTERVENTION;  Surgeon: Arleen Lacer, MD;  Location: [facility] INVASIVE CV LAB;  Service: Cardiovascular;  Laterality: N/A;   FEMORAL-POPLITEAL BYPASS GRAFT Right 03/31/2016   Procedure: BYPASS GRAFT FEMORAL-POPLITEAL ARTERY USING RIGHT GREATER SAPHENOUS NONREVERSED VEIN;  Surgeon: Dannis Dy, MD;  Location: [facility] OR;  Service: Vascular;  Laterality: Right;   HERNIA REPAIR     I & D EXTREMITY Right 03/31/2016   Procedure: IRRIGATION AND DEBRIDEMENT FOOT;  Surgeon: Dannis Dy, MD;  Location: [facility] OR;  Service: Vascular;  Laterality: Right;   IMPLANTABLE CARDIOVERTER DEFIBRILLATOR IMPLANT N/A 06/27/2013   Procedure: SUB Q ICD;  Surgeon: Verona Goodwill, MD;  Location: [facility] CATH LAB;  Service: Cardiovascular;  Laterality: N/A;   INTRAOPERATIVE ARTERIOGRAM Right 03/31/2016   Procedure: INTRA OPERATIVE ARTERIOGRAM;  Surgeon: Dannis Dy, MD;  Location: [facility] OR;  Service: Vascular;  Laterality: Right;   IR GENERIC HISTORICAL Right 11/30/2015   IR THROMBECTOMY AV FISTULA W/THROMBOLYSIS/PTA INC/SHUNT/IMG RIGHT 11/30/2015 Erica Hau, MD [facility]   IR GENERIC HISTORICAL  11/30/2015   IR US  GUIDE VASC ACCESS RIGHT 11/30/2015  Erica Hau, MD [facility]   IR GENERIC HISTORICAL Right 12/15/2015   IR THROMBECTOMY AV FISTULA W/THROMBOLYSIS/PTA/STENT INC/SHUNT/IMG RT 12/15/2015 Marland Silvas, MD [facility]   IR GENERIC HISTORICAL  12/15/2015   IR US  GUIDE VASC ACCESS RIGHT 12/15/2015 Marland Silvas, MD [facility]   IR GENERIC HISTORICAL  12/28/2015   IR FLUORO GUIDE CV LINE RIGHT 12/28/2015 Artice Last, MD [facility]   IR GENERIC HISTORICAL  12/28/2015   IR US  GUIDE VASC ACCESS RIGHT 12/28/2015 Artice Last, MD [facility]   LEFT A ND RIGHT HEART CATH  01/30/2013   DR Dasie Epps   LEFT AND RIGHT HEART CATHETERIZATION WITH CORONARY ANGIOGRAM N/A 01/30/2013   Procedure: LEFT AND RIGHT HEART CATHETERIZATION WITH CORONARY ANGIOGRAM;  Surgeon: Mardell Shade, MD;  Location: [facility] CATH LAB;  Service: Cardiovascular;  Laterality: N/A;   LEFT HEART CATH AND CORONARY ANGIOGRAPHY N/A 04/12/2023   Procedure: LEFT HEART CATH AND CORONARY ANGIOGRAPHY;  Surgeon: Swaziland, Peter M, MD;  Location: [facility] INVASIVE CV LAB;  Service: Cardiovascular;  Laterality: N/A;   PERIPHERAL VASCULAR CATHETERIZATION Right 01/26/2015   Procedure: A/V Fistulagram;  Surgeon: Dannis Dy, MD;  Location: [facility] INVASIVE CV LAB;  Service: Cardiovascular;  Laterality: Right;   reapea urethral surgery for recurrent obstruction  2011   TOTAL KNEE ARTHROPLASTY Right 2007   VEIN HARVEST Right 03/31/2016   Procedure: RIGHT GREATER SAPHENOUS VEIN HARVEST;  Surgeon: Dannis Dy, MD;  Location: [facility] OR;  Service: Vascular;  Laterality: Right;     Social History:   reports that he quit smoking about 12 years ago. His smoking use  included cigarettes. He started smoking about 45 years ago. He has a 64 pack-year smoking history. He has never used smokeless tobacco. He reports that he does not drink alcohol  and does not use drugs.   Family History:  His family history includes Alcohol  abuse in his father; Bladder Cancer in his mother; Diabetes in his  maternal grandmother; Heart Problems in his maternal grandmother; Heart disease in his maternal grandfather; Melanoma in his father; Prostate cancer in his maternal grandfather; Stroke in his maternal grandmother.   Allergies Allergies  Allergen Reactions   Epoetin  Alfa Other (See Comments)    Unknown    Ferumoxytol  Other (See Comments)    Unknown    Morphine Sulfate Rash and Other (See Comments)    Itches all over, red spots     Home Medications  Prior to Admission medications   Medication Sig Start Date End Date Taking? Authorizing Provider  acetaminophen  (TYLENOL ) 325 MG tablet Take 1-2 tablets (325-650 mg total) by mouth every 4 (four) hours as needed for mild pain. 11/29/19  Yes Love, Renay Carota, PA-C  apixaban  (ELIQUIS ) 5 MG TABS tablet Take 1 tablet (5 mg total) by mouth 2 (two) times daily. 12/21/22  Yes Burchette, Marijean Shouts, MD  aspirin  EC 81 MG tablet Take 1 tablet (81 mg total) by mouth daily. Swallow whole. 04/15/23  Yes Hongalgi, Anand D, MD  atorvastatin  (LIPITOR ) 80 MG tablet TAKE 1 TABLET BY MOUTH AT BEDTIME Patient taking differently: Take 80 mg by mouth daily. 06/24/22  Yes Burchette, Marijean Shouts, MD  carvedilol  (COREG ) 12.5 MG tablet Take 1 tablet (12.5 mg total) by mouth 2 (two) times daily. 01/25/23  Yes Burchette, Marijean Shouts, MD  cinacalcet  (SENSIPAR ) 30 MG tablet Take 1 tablet (30 mg total) by mouth Every Tuesday,Thursday,and Saturday with dialysis. 11/30/19  Yes Love, Renay Carota, PA-C  clopidogrel  (PLAVIX ) 75 MG tablet Take 1 tablet (75 mg total) by mouth daily with breakfast. 04/16/23  Yes Hongalgi, Thomasene Flemings, MD  Continuous Glucose Receiver (FREESTYLE LIBRE 3 READER) DEVI Use to check blood glucose TID 08/11/22  Yes Burchette, Marijean Shouts, MD  Continuous Glucose Sensor (FREESTYLE LIBRE 3 PLUS SENSOR) MISC Change sensor every 15 days. Use as directed to check glucose daily 12/21/22  Yes Burchette, Marijean Shouts, MD  gabapentin  (NEURONTIN ) 300 MG capsule Take 1 capsule (300 mg total) by mouth at  bedtime. 04/15/23  Yes Hongalgi, Anand D, MD  HUMALOG  KWIKPEN 200 UNIT/ML KwikPen INJECT A MAXIMUM OF 28 UNITS SUBCUTANEOUSLY TWICE DAILY WITH LUNCH AND SUPPER PER SLIDING SCALE. APPOINTMENT REQUIRED FOR FUTURE REFILLS 11/11/22  Yes Burchette, Marijean Shouts, MD  LANTUS  SOLOSTAR 100 UNIT/ML Solostar Pen INJECT 12 UNITS SUBCUTANEOUSLY AT BEDTIME Patient taking differently: Inject 14 Units into the skin at bedtime. 07/15/22  Yes Burchette, Marijean Shouts, MD  midodrine  (PROAMATINE ) 10 MG tablet Take 10 mg by mouth 3 (three) times a week. EVERY TUESDAY, THURSDAY, AND SATURDAY 08/04/20  Yes [provider]  multivitamin (RENA-VIT) TABS tablet Take 1 tablet by mouth once daily Patient taking differently: Take 1 tablet by mouth daily. 07/06/21  Yes Burchette, Marijean Shouts, MD  nitroGLYCERIN  (NITROSTAT ) 0.4 MG SL tablet Place 1 tablet (0.4 mg total) under the tongue every 5 (five) minutes as needed for chest pain. 04/21/23  Yes Burchette, Marijean Shouts, MD  Olopatadine  HCl 0.2 % SOLN Place 1 drop into both eyes daily as needed (for allergies).    Yes [provider]  sevelamer  (RENAGEL ) 800 MG tablet Take 1,600 mg  by mouth 3 (three) times daily with meals.   Yes [provider]  VENTOLIN  HFA 108 (90 Base) MCG/ACT inhaler Inhale 1-2 puffs into the lungs every 6 (six) hours as needed for wheezing or shortness of breath. 03/20/23  Yes Burchette, Marijean Shouts, MD  glucose blood test strip Check 1 time daily. E11.9 One Touch Ultra Blue Test Strips 03/10/14   Burchette, Marijean Shouts, MD  Insulin  Pen Needle (BD PEN NEEDLE NANO U/F) 32G X 4 MM MISC USE 1 PEN NEEDLE SUBCUTANEOUSLY WITH INSULIN  4 TIMES DAILY 12/04/19   Burchette, Marijean Shouts, MD     Critical care time: 73   The patient is critically ill with multiple organ system failure and requires high complexity decision making for assessment and support, frequent evaluation and titration of therapies, advanced monitoring, review of radiographic studies and interpretation of complex  data.   Critical Care Time devoted to patient care services, exclusive of separately billable procedures, described in this note is 40 minutes.  Claven Cumming, MD [facility] See Amion for pager  If no response to pager , please call [phone] until 7pm After 7:00 pm call Elink  [phone] 04/30/2023, 2:38 AM

## 2023-05-01 ENCOUNTER — Encounter (HOSPITAL_COMMUNITY): Payer: Self-pay | Admitting: Gastroenterology

## 2023-05-01 DIAGNOSIS — D62 Acute posthemorrhagic anemia: Secondary | ICD-10-CM | POA: Diagnosis not present

## 2023-05-01 DIAGNOSIS — I4891 Unspecified atrial fibrillation: Secondary | ICD-10-CM | POA: Diagnosis not present

## 2023-05-01 DIAGNOSIS — A0471 Enterocolitis due to Clostridium difficile, recurrent: Secondary | ICD-10-CM | POA: Diagnosis not present

## 2023-05-01 DIAGNOSIS — I959 Hypotension, unspecified: Secondary | ICD-10-CM | POA: Diagnosis not present

## 2023-05-01 LAB — APTT
aPTT: 78 s — ABNORMAL HIGH (ref 24–36)
aPTT: 75 s — ABNORMAL HIGH (ref 24–36)
aPTT: 80 s — ABNORMAL HIGH (ref 24–36)
aPTT: 53 s — ABNORMAL HIGH (ref 24–36)

## 2023-05-01 LAB — GLUCOSE, CAPILLARY
Glucose-Capillary: 169 mg/dL — ABNORMAL HIGH (ref 70–99)
Glucose-Capillary: 286 mg/dL — ABNORMAL HIGH (ref 70–99)
Glucose-Capillary: 137 mg/dL — ABNORMAL HIGH (ref 70–99)
Glucose-Capillary: 129 mg/dL — ABNORMAL HIGH (ref 70–99)

## 2023-05-01 LAB — CBC WITH DIFFERENTIAL/PLATELET
Abs Immature Granulocytes: 0.09 10*3/uL — ABNORMAL HIGH (ref 0.00–0.07)
Basophils Absolute: 0.1 10*3/uL (ref 0.0–0.1)
Basophils Relative: 1 %
Eosinophils Absolute: 0.6 10*3/uL — ABNORMAL HIGH (ref 0.0–0.5)
Eosinophils Relative: 4 %
HCT: 26.6 % — ABNORMAL LOW (ref 39.0–52.0)
Hemoglobin: 9 g/dL — ABNORMAL LOW (ref 13.0–17.0)
Immature Granulocytes: 1 %
Lymphocytes Relative: 7 %
Lymphs Abs: 1.1 10*3/uL (ref 0.7–4.0)
MCH: 30.8 pg (ref 26.0–34.0)
MCHC: 33.8 g/dL (ref 30.0–36.0)
MCV: 91.1 fL (ref 80.0–100.0)
Monocytes Absolute: 1.3 10*3/uL — ABNORMAL HIGH (ref 0.1–1.0)
Monocytes Relative: 9 %
Neutro Abs: 11.2 10*3/uL — ABNORMAL HIGH (ref 1.7–7.7)
Neutrophils Relative %: 78 %
Platelets: 231 10*3/uL (ref 150–400)
RBC: 2.92 MIL/uL — ABNORMAL LOW (ref 4.22–5.81)
RDW: 16.9 % — ABNORMAL HIGH (ref 11.5–15.5)
WBC: 14.3 10*3/uL — ABNORMAL HIGH (ref 4.0–10.5)
nRBC: 0 % (ref 0.0–0.2)

## 2023-05-01 LAB — RENAL FUNCTION PANEL
Albumin: 2.1 g/dL — ABNORMAL LOW (ref 3.5–5.0)
Anion gap: 15 (ref 5–15)
BUN: 56 mg/dL — ABNORMAL HIGH (ref 8–23)
CO2: 23 mmol/L (ref 22–32)
Calcium: 7.9 mg/dL — ABNORMAL LOW (ref 8.9–10.3)
Chloride: 96 mmol/L — ABNORMAL LOW (ref 98–111)
Creatinine, Ser: 7.17 mg/dL — ABNORMAL HIGH (ref 0.61–1.24)
GFR, Estimated: 8 mL/min — ABNORMAL LOW (ref >60)
Glucose, Bld: 178 mg/dL — ABNORMAL HIGH (ref 70–99)
Phosphorus: 4.7 mg/dL — ABNORMAL HIGH (ref 2.5–4.6)
Potassium: 3.7 mmol/L (ref 3.5–5.1)
Sodium: 134 mmol/L — ABNORMAL LOW (ref 135–145)

## 2023-05-01 LAB — SURGICAL PATHOLOGY

## 2023-05-01 LAB — HEPARIN LEVEL (UNFRACTIONATED)
Heparin Unfractionated: 0.87 [IU]/mL — ABNORMAL HIGH (ref 0.30–0.70)
Heparin Unfractionated: 0.56 [IU]/mL (ref 0.30–0.70)

## 2023-05-01 MED ORDER — AMIODARONE HCL 200 MG PO TABS
200.0000 mg | ORAL_TABLET | Freq: Two times a day (BID) | ORAL | Status: DC
  Administered 2023-05-01 – 2023-05-02 (×2): 200 mg via ORAL
  Filled 2023-05-01 (×2): qty 1

## 2023-05-01 MED ORDER — INSULIN ASPART 100 UNIT/ML IJ SOLN
5.0000 [IU] | Freq: Three times a day (TID) | INTRAMUSCULAR | Status: DC
  Administered 2023-05-01: 5 [IU] via SUBCUTANEOUS

## 2023-05-01 MED ORDER — CHLORHEXIDINE GLUCONATE CLOTH 2 % EX PADS: 6.0000 | MEDICATED_PAD | Freq: Every day | CUTANEOUS | Status: DC

## 2023-05-01 MED ORDER — PANTOPRAZOLE SODIUM 40 MG PO TBEC
40.0000 mg | DELAYED_RELEASE_TABLET | Freq: Two times a day (BID) | ORAL | Status: DC
  Administered 2023-05-01 – 2023-05-02 (×2): 40 mg via ORAL
  Filled 2023-05-01 (×2): qty 1

## 2023-05-01 MED ORDER — AMIODARONE HCL 200 MG PO TABS: 200.0000 mg | ORAL_TABLET | Freq: Every day | ORAL | Status: DC

## 2023-05-01 NOTE — Progress Notes (Signed)
 PHARMACY - ANTICOAGULATION CONSULT NOTE  Pharmacy Consult for heparin  Indication: atrial fibrillation  Allergies  Allergen Reactions   Epoetin  Alfa Other (See Comments)    Unknown    Ferumoxytol  Other (See Comments)    Unknown    Morphine Sulfate Rash and Other (See Comments)    Itches all over, red spots    Patient Measurements: Height: 5\' 10"  (177.8 cm) Weight: 91.4 kg (201 lb 8 oz) IBW/kg (Calculated) : 73 HEPARIN  DW (KG): 89.4  Vital Signs: Temp: 97.5 F (36.4 C) (04/21 1951) Temp Source: Oral (04/21 1951) BP: 110/90 (04/21 2000) Pulse Rate: 83 (04/21 2000)  Labs: Recent Labs    04/29/23 1333 04/30/23 0127 04/30/23 0131 04/30/23 0440 04/30/23 0936 05/01/23 0509 05/01/23 0509 05/01/23 0904 05/01/23 1233 05/01/23 1857  HGB 8.4* 8.4*  --   --   --  9.0*  --   --   --   --   HCT 25.0* 25.2*  --   --   --  26.6*  --   --   --   --   PLT 185 175  --   --   --  231  --   --   --   --   APTT  --   --   --   --   --  78*   < > 75* 80* 53*  HEPARINUNFRC  --   --   --   --   --  0.87*  --  0.56  --   --   CREATININE  --   --    < > 5.09* 5.93* 7.17*  --   --   --   --   TROPONINIHS  --   --   --   --  6,990*  --   --   --   --   --    < > = values in this interval not displayed.    Estimated Creatinine Clearance: 12.3 mL/min (A) (by C-G formula based on SCr of 7.17 mg/dL (H)).   Medical History: Past Medical History:  Diagnosis Date   AICD (automatic cardioverter/defibrillator) present    [employer]   Allergic rhinitis    Anemia    Arthritis    Chronic systolic heart failure (HCC)    a. ECHO (12/2012) EF 25-30%, HK entireanteroseptal myocardium //  b.  EF 25%, diffuse HK, grade 1 diastolic dysfunction, MAC, mild LAE, normal RVSF, trivial pericardial effusion   COPD (chronic obstructive pulmonary disease) (HCC)    Diabetes mellitus type II    Diabetic nephropathy (HCC)    Diabetic neuropathy (HCC)    ESRD on hemodialysis (HCC)    started HD June  2017, goes to [facility] HD unit, Dr Lieutenant Reese   History of cardiac catheterization    a.Myoview  1/15:  There is significant left ventricular dysfunction. There may be slight scar at the apex. There is no significant ischemia. LV Ejection Fraction: 27%  //  b. RHC/LHC (1/15) with mean RA 6, PA 47/22 mean 33, mean PCWP 20, PVR 2.5 WU, CI 2.5; 80% dLAD stenosis, 70% diffuse large D.     History of kidney stones    Hyperlipidemia    Hypertension    Kidney stones    NICM (nonischemic cardiomyopathy) (HCC)    Primarily nonischemic.  Echo (12/14) with EF 25-30%.  Echo (3/15) with EF 25%, mild to moderately dilated LV, normal RV size and systolic function.     Osteomyelitis (HCC)  left fifth ray   Pneumonia    Urethral stricture    Wears glasses     Medications:  Medications Prior to Admission  Medication Sig Dispense Refill Last Dose/Taking   acetaminophen  (TYLENOL ) 325 MG tablet Take 1-2 tablets (325-650 mg total) by mouth every 4 (four) hours as needed for mild pain.   04/26/2023   apixaban  (ELIQUIS ) 5 MG TABS tablet Take 1 tablet (5 mg total) by mouth 2 (two) times daily. 180 tablet 3 04/26/2023 at  6:00 PM   aspirin  EC 81 MG tablet Take 1 tablet (81 mg total) by mouth daily. Swallow whole. 30 tablet 2 04/25/2023   atorvastatin  (LIPITOR ) 80 MG tablet TAKE 1 TABLET BY MOUTH AT BEDTIME (Patient taking differently: Take 80 mg by mouth daily.) 90 tablet 3 04/26/2023   carvedilol  (COREG ) 12.5 MG tablet Take 1 tablet (12.5 mg total) by mouth 2 (two) times daily. 180 tablet 3 04/26/2023   cinacalcet  (SENSIPAR ) 30 MG tablet Take 1 tablet (30 mg total) by mouth Every Tuesday,Thursday,and Saturday with dialysis. 60 tablet  04/27/2023   clopidogrel  (PLAVIX ) 75 MG tablet Take 1 tablet (75 mg total) by mouth daily with breakfast. 30 tablet 2 04/26/2023   Continuous Glucose Receiver (FREESTYLE LIBRE 3 READER) DEVI Use to check blood glucose TID 1 each 0 Taking   Continuous Glucose Sensor (FREESTYLE LIBRE 3 PLUS  SENSOR) MISC Change sensor every 15 days. Use as directed to check glucose daily 6 each 3 Taking   gabapentin  (NEURONTIN ) 300 MG capsule Take 1 capsule (300 mg total) by mouth at bedtime. 30 capsule 2 04/26/2023 Bedtime   HUMALOG  KWIKPEN 200 UNIT/ML KwikPen INJECT A MAXIMUM OF 28 UNITS SUBCUTANEOUSLY TWICE DAILY WITH LUNCH AND SUPPER PER SLIDING SCALE. APPOINTMENT REQUIRED FOR FUTURE REFILLS 6 mL 5 04/26/2023   LANTUS  SOLOSTAR 100 UNIT/ML Solostar Pen INJECT 12 UNITS SUBCUTANEOUSLY AT BEDTIME (Patient taking differently: Inject 14 Units into the skin at bedtime.) 15 mL 0 04/26/2023   midodrine  (PROAMATINE ) 10 MG tablet Take 10 mg by mouth 3 (three) times a week. EVERY TUESDAY, THURSDAY, AND SATURDAY   04/27/2023   multivitamin (RENA-VIT) TABS tablet Take 1 tablet by mouth once daily (Patient taking differently: Take 1 tablet by mouth daily.) 10 tablet 3 04/25/2023   nitroGLYCERIN  (NITROSTAT ) 0.4 MG SL tablet Place 1 tablet (0.4 mg total) under the tongue every 5 (five) minutes as needed for chest pain. 30 tablet 1 Unknown   Olopatadine  HCl 0.2 % SOLN Place 1 drop into both eyes daily as needed (for allergies).    Unknown   sevelamer  (RENAGEL ) 800 MG tablet Take 1,600 mg by mouth 3 (three) times daily with meals.   04/26/2023   VENTOLIN  HFA 108 (90 Base) MCG/ACT inhaler Inhale 1-2 puffs into the lungs every 6 (six) hours as needed for wheezing or shortness of breath. 18 g 1 Unknown   glucose blood test strip Check 1 time daily. E11.9 One Touch Ultra Blue Test Strips 100 each 3    Insulin  Pen Needle (BD PEN NEEDLE NANO U/F) 32G X 4 MM MISC USE 1 PEN NEEDLE SUBCUTANEOUSLY WITH INSULIN  4 TIMES DAILY 400 each 0     Assessment: 63 y.o. M presents with Hgb 5.4, FOBT+. Pt noted some melena since PCI (4/4) - pt has been on ASA 81, plavix , and Eliquis  pta for afib and recent PCI. Eliquis  held since admission. Last dose 4/16 1800. Hgb improved to 8.4 (required 4 units PRBC this admission). EGD showed nonbleeding  duodenal  ulcers.   Okay to begin heparin  per cardiology (as pt went into afib overnight) - will aim for low end of therapeutic range and no boluses with recent GI bleed. Eliquis  likely still affecting heparin  level so will utilize aPTT until levels correlating.  aPTT is subtherapeutic at 53, on heparin  infusion at 1000 units/hr. Level drawn appropriately from opposite side of heparin  infusion. No issues with infusion or s/sx of bleeding per RN. CBC stable.  Goal of Therapy:  Heparin  level 0.3-0.5 units/ml aPTT 66-85 seconds Monitor platelets by anticoagulation protocol: Yes   Plan:  Increase heparin  1150 units/hr F/u 8hr aPTT/HL with AM labs Daily heparin  level, aPTT, and CBC Monitor Hgb and for any s/s bleeding F/u plans to transition back to Eliquis   Thank you for allowing pharmacy to participate in this patient's care,  Nieves Bars, PharmD, BCCCP Clinical Pharmacist  Phone: [phone] 05/01/2023 9:02 PM  Please check AMION for all [facility] Pharmacy phone numbers After 10:00 PM, call Main Pharmacy [phone]

## 2023-05-01 NOTE — Inpatient Diabetes Management (Signed)
 Inpatient Diabetes Program Recommendations  AACE/ADA: New Consensus Statement on Inpatient Glycemic Control (2015)  Target Ranges:  Prepandial:   less than 140 mg/dL      Peak postprandial:   less than 180 mg/dL (1-2 hours)      Critically ill patients:  140 - 180 mg/dL   Lab Results  Component Value Date   GLUCAP 169 (H) 05/01/2023   HGBA1C 6.6 (A) 03/20/2023    Latest Reference Range & Units 04/30/23 11:03 04/30/23 15:27 04/30/23 17:04 04/30/23 23:39 05/01/23 07:29  Glucose-Capillary 70 - 99 mg/dL 161 (H) 096 (H) 045 (H) 266 (H) 169 (H)  (H): Data is abnormally high  Inpatient Diabetes Program Recommendations:   Please consider: -Add Novolog  2 units tid meal coverage if eats 50% meal  Thank you, Marily Shows E. Teola Felipe, RN, MSN, CDCES  Diabetes Coordinator Inpatient Glycemic Control Team Team Pager [phone] (8am-5pm) 05/01/2023 9:32 AM

## 2023-05-01 NOTE — Progress Notes (Signed)
 NAME:  Anthony Bullock, MRN:  [id number], DOB:  August 15, 1960, LOS: 4 ADMISSION DATE:  04/27/2023, CONSULTATION DATE:  04/30/2023 REFERRING MD:  Corrinne Din, MD, CHIEF COMPLAINT:  Hypotension/A. Fib with RVR  History of Present Illness:  Anthony Bullock is a 63 y.o. male with medical history significant for osteoarthritis, chronic systolic CHF, COPD, OSA-severe, type 2 diabetes mellitus with diabetic neuropathy, end-stage renal disease on hemodialysis on TTS, hypertension, PVD, s/p bilateral BKA, dyslipidemia, urolithiasis and nonischemic cardiomyopathy, Paroxysmal A fib, C diff history who presented on 4/01 to the emergency room with acute onset of dyspnea and a feeling of fluid overload as if he needs dialysis and midsternal chest pain felt as pressure. He ruled in for NSTEMI.  He was taken to cath lab and underwent staged PCI on 04/02, s/p OM PCI and bifurcation stenting of LAD/D1, staged RCA PCI with residual critical Lcx disease with LVEF 40%: On triple therapy after PCI, aspirin  81 mg once daily, Plavix  75 mg once daily and Eliquis  5 mg twice daily.  He came back to ED on 04/17 with N/V/D x 3 days.  Vomiting with coffee grounds and stools were dark. His HgB was 5.6 and he was transfused PRBC.  He was also found to be C diff positive and started on Vancomycin .  He had HD yesterday and approximately 2 liters were removed. Given hypotension his Coreg  and anticoagulants were held. Unfortunately, he had a GI bleed and underwent EGD showing a non-bleeding duodenal ulcer. Tonight he was a rapid response and went into A fib with RVR, rates to 140's and became hypotensive.  He was given 250cc x 2 fluid boluses and 10mg  po Midodrine .  Amiodarone  drip started and given his hypotension, Levophed  was also started.  Cardiology was notified. Patient denies chest pains or abd pains.  He has mild SOB and was found to be on 100%NRM upon my seeing him on the floor.  Pertinent  Medical History  osteoarthritis, chronic  systolic CHF, COPD, OSA-severe, type 2 diabetes mellitus with diabetic neuropathy, end-stage renal disease on hemodialysis on TTS, hypertension, PVD, s/p bilateral BKA, dyslipidemia, urolithiasis and nonischemic cardiomyopathy, Paroxysmal A fib, C diff, CAD-staged PCI on 04/02, s/p OM PCI and bifurcation stenting of LAD/D1, staged RCA PCI with residual critical Lcx disease with LVEF 40%  Significant Hospital Events: Including procedures, antibiotic start and stop dates in addition to other pertinent events   4/17-admitted with UGIB 4/20-transferred to ICU for hypotension, A fib with RVR  Interim History / Subjective:   Off Levophed  since 8 PM Afebrile No Chest pain or dyspnea 2 loose stools today  Objective   Blood pressure (!) 123/53, pulse 82, temperature 97.7 F (36.5 C), temperature source Oral, resp. rate (!) 22, height 5\' 10"  (1.778 m), weight 91.4 kg, SpO2 99%.    PEEP:  [4 cmH20] 4 cmH20   Intake/Output Summary (Last 24 hours) at 05/01/2023 4098 Last data filed at 05/01/2023 0800 Gross per 24 hour  Intake 1035.94 ml  Output --  Net 1035.94 ml   Filed Weights   04/27/23 0347 04/29/23 1311 04/29/23 1636  Weight: 89.4 kg 92.8 kg 91.4 kg    Examination: General: Out of bed to chair, no distress HENT: pupils reactive no icterus EOMI Lungs: clear breath sounds bilateral, no wheezes no rhonchi Cardiovascular: S1-2 regular, frequent PVCs on monitor Abdomen: soft nt nd bs pos Extremities: no cyanosis, edema, cool but not cold and b/l BKAs Neuro: AAO x 3 CN II to XII  grossly intact   Labs show improved hyponatremia, decreased leukocytosis, stable improved anemia  Resolved Hospital Problem list   N/a  Assessment & Plan:  Hypotension  - No active bleeding, likely related to A-fib/RVR             - Continue midodrine    UGIB  -No BRBPR, No Melanotic stools since admit  -Continue PPI, tolerating heparin  with stable hemoglobin Recent coronary stents/staged PCI on  anticoagulation  --Per cards recommendation, stop Eliquis  and ASA, continue Plavix               ESRD on HD  - due for HD on Tuesday  C diff colitis  -Per GI-switch from oral vancomycin  to Fidaxomicin  CHFrEF  - Resume meds when able A fib with RVR  -improvements in rate with Amiodarone  drip, now on oral Amio, plan for 200 twice daily for 3 weeks followed by 200 once daily - Consider watchman in the future  Best Practice (right click and "Reselect all SmartList Selections" daily)   Diet/type: Regular consistency (see orders) DVT prophylaxis not indicated, GIB Pressure ulcer(s): N/A GI prophylaxis: PPI Lines: N/A Foley:  N/A Code Status:  full code  Labs   CBC: Recent Labs  Lab 04/28/23 1117 04/28/23 1820 04/29/23 0437 04/29/23 1333 04/30/23 0127 05/01/23 0509  WBC 30.3*  --  26.7* 17.7* 17.4* 14.3*  NEUTROABS  --   --   --  16.2* 14.4* 11.2*  HGB 7.5* 9.8* 8.7* 8.4* 8.4* 9.0*  HCT 22.6* 28.2* 25.3* 25.0* 25.2* 26.6*  MCV 91.1  --  88.2 89.6 90.0 91.1  PLT 185  --  192 185 175 231    Basic Metabolic Panel: Recent Labs  Lab 04/28/23 0639 04/29/23 0437 04/30/23 0131 04/30/23 0440 04/30/23 0936 05/01/23 0509  NA 130* 132* 133* 121* 133* 134*  K 4.2 4.0 3.7 3.5 3.9 3.7  CL 91* 95* 96* 86* 93* 96*  CO2 18* 20* 24 20* 23 23  GLUCOSE 273* 252* 148* 561* 234* 178*  BUN 55* 73* 40* 38* 45* 56*  CREATININE 5.93* 7.41* 5.29* 5.09* 5.93* 7.17*  CALCIUM  8.1* 8.3* 8.1* 7.3* 8.3* 7.9*  PHOS 5.6* 5.1*  --  3.9  --  4.7*   GFR: Estimated Creatinine Clearance: 12.3 mL/min (A) (by C-G formula based on SCr of 7.17 mg/dL (H)). Recent Labs  Lab 04/28/23 [phone] 04/28/23 0640 04/28/23 1117 04/29/23 0437 04/29/23 1333 04/30/23 0011 04/30/23 0127 04/30/23 0433 04/30/23 0440 05/01/23 0509  PROCALCITON  --   --   --   --   --   --   --  1.74  --   --   WBC  --    < > 30.3* 26.7* 17.7*  --  17.4*  --   --  14.3*  LATICACIDVEN 3.3*  --  2.3*  --   --  1.3  --   --  1.3  --     < > = values in this interval not displayed.    Liver Function Tests: Recent Labs  Lab 04/27/23 0404 04/28/23 0639 04/29/23 0437 04/30/23 0440 04/30/23 0936 05/01/23 0509  AST 28  --   --   --  47*  --   ALT 22  --   --   --  25  --   ALKPHOS 62  --   --   --  71  --   BILITOT 0.4  --   --   --  0.8  --  PROT 5.9*  --   --   --  5.9*  --   ALBUMIN  2.6* 2.3* 2.1* 2.0* 2.2* 2.1*   Recent Labs  Lab 04/27/23 0404  LIPASE 34   No results for input(s): "AMMONIA" in the last 168 hours.  ABG    Component Value Date/Time   PHART 7.33 (L) 03/07/2023 0006   PCO2ART 34 03/07/2023 0006   PO2ART 41 (L) 03/07/2023 0006   HCO3 17.9 (L) 03/07/2023 0006   TCO2 26 08/05/2022 1806   ACIDBASEDEF 7.2 (H) 03/07/2023 0006   O2SAT 69.2 03/07/2023 0006     Coagulation Profile: No results for input(s): "INR", "PROTIME" in the last 168 hours.  Cardiac Enzymes: No results for input(s): "CKTOTAL", "CKMB", "CKMBINDEX", "TROPONINI" in the last 168 hours.  HbA1C: Hemoglobin A1C  Date/Time Value Ref Range Status  03/20/2023 02:58 PM 6.6 (A) 4.0 - 5.6 % Final  12/21/2022 04:33 PM 7.3 (A) 4.0 - 5.6 % Final  02/15/2018 12:00 AM 11.6  Final    Comment:    [facility] in [place]   Hgb A1c MFr Bld  Date/Time Value Ref Range Status  11/13/2019 04:43 PM 6.5 (H) 4.8 - 5.6 % Final    Comment:    (NOTE) Pre diabetes:          5.7%-6.4%  Diabetes:              >6.4%  Glycemic control for   <7.0% adults with diabetes   07/10/2019 04:44 AM 8.1 (H) 4.8 - 5.6 % Final    Comment:    (NOTE)         Prediabetes: 5.7 - 6.4         Diabetes: >6.4         Glycemic control for adults with diabetes: <7.0     CBG: Recent Labs  Lab 04/30/23 1103 04/30/23 1527 04/30/23 1704 04/30/23 2339 05/01/23 0729  GLUCAP 213* 298* 289* 266* 169*    Transferred to cardiac telemetry and to [facility] 4/22  Celene Coins MD. [facility]. [facility] Pager : [phone]  If no response to  pager , please call [phone] until 7 pm After 7:00 pm call Elink  [phone]    05/01/2023, 8:32 AM

## 2023-05-01 NOTE — Anesthesia Postprocedure Evaluation (Signed)
 Anesthesia Post Note  Patient: LAYTEN AIKEN  Procedure(s) Performed: EGD (ESOPHAGOGASTRODUODENOSCOPY) BIOPSY, SKIN, SUBCUTANEOUS TISSUE, OR MUCOUS MEMBRANE     Patient location during evaluation: PACU Anesthesia Type: MAC Level of consciousness: awake and alert Pain management: pain level controlled Vital Signs Assessment: post-procedure vital signs reviewed and stable Respiratory status: spontaneous breathing, nonlabored ventilation, respiratory function stable and patient connected to nasal cannula oxygen  Cardiovascular status: stable and blood pressure returned to baseline Postop Assessment: no apparent nausea or vomiting Anesthetic complications: no   No notable events documented.  Last Vitals:  Vitals:   05/01/23 1826 05/01/23 1951  BP:    Pulse:    Resp:    Temp: (!) 36.3 C (!) 36.4 C  SpO2:      Last Pain:  Vitals:   05/01/23 1951  TempSrc: Oral  PainSc:                  Valente Gaskin Lonita Debes

## 2023-05-01 NOTE — Evaluation (Signed)
 Occupational Therapy Evaluation Patient Details Name: Anthony Bullock MRN: [id number] DOB: 1960/10/18 Today's Date: 05/01/2023   History of Present Illness   63 yo male who presented on 4/17 with N/V x 3days, Hgb 5.6; C Diff positive as well; Diagnosed with GI Bleed and underwent EGD, duodenal ulcer; late 4/19 pt went to ICU for afib with RVR and significant hypotension (received amioderon, and midodrine ); recent admission 4/01 to the emergency room with acute onset of dyspnea and a feeling of fluid overload as if he needs dialysis and midsternal chest pain felt as pressure; Ruled in for NSTEMI; To Cath Lab on 4/2 for PCI; with medical history significant for osteoarthritis, chronic systolic CHF, COPD, OSA-severe, type 2 diabetes mellitus with diabetic neuropathy, end-stage renal disease on hemodialysis on TTS, hypertension, PVD, s/p bilateral BKA, dyslipidemia, urolithiasis and nonischemic cardiomyopathy, Paroxysmal A fib, C diff history     Clinical Impressions Pt ind at baseline with ADLs, uses quad cane for mobility, spouse assists with transportation/IADLs/ pt currently needing up to min A for ADLs and CGA for transfers/hallway ambulation with quad cane. Pt denies dizziness throughout session. Pt presenting with impairments listed below, will follow acutely. Anticipate no OT follow up needs at d/c.      If plan is discharge home, recommend the following:   A little help with bathing/dressing/bathroom;Assist for transportation     Functional Status Assessment   Patient has had a recent decline in their functional status and demonstrates the ability to make significant improvements in function in a reasonable and predictable amount of time.     Equipment Recommendations   None recommended by OT     Recommendations for Other Services   PT consult     Precautions/Restrictions   Precautions Precautions: Fall Precaution/Restrictions Comments: Bil BKAs; Prostheses in room, pt  can don with setup assist Restrictions Weight Bearing Restrictions Per Provider Order: No     Mobility Bed Mobility               General bed mobility comments: OOB in chair upon arrival and departure    Transfers Overall transfer level: Needs assistance Equipment used: Quad cane Transfers: Sit to/from Stand, Bed to chair/wheelchair/BSC Sit to Stand: Contact guard assist                  Balance Overall balance assessment: Mild deficits observed, not formally tested                                         ADL either performed or assessed with clinical judgement   ADL Overall ADL's : Needs assistance/impaired Eating/Feeding: Supervision/ safety   Grooming: Supervision/safety   Upper Body Bathing: Supervision/ safety   Lower Body Bathing: Supervison/ safety   Upper Body Dressing : Contact guard assist   Lower Body Dressing: Minimal assistance   Toilet Transfer: Supervision/safety;Ambulation   Toileting- Clothing Manipulation and Hygiene: Contact guard assist       Functional mobility during ADLs: Supervision/safety;Cane       Vision Baseline Vision/History: 1 Wears glasses Vision Assessment?: No apparent visual deficits     Perception Perception: Not tested       Praxis Praxis: Not tested       Pertinent Vitals/Pain Pain Assessment Pain Assessment: No/denies pain     Extremity/Trunk Assessment Upper Extremity Assessment Upper Extremity Assessment: Overall WFL for tasks assessed   Lower Extremity  Assessment Lower Extremity Assessment: Defer to PT evaluation   Cervical / Trunk Assessment Cervical / Trunk Assessment: Other exceptions   Communication Communication Communication: No apparent difficulties   Cognition Arousal: Alert Behavior During Therapy: WFL for tasks assessed/performed Cognition: No apparent impairments                               Following commands: Intact        Cueing  General Comments   Cueing Techniques: Verbal cues  VSS   Exercises     Shoulder Instructions      Home Living Family/patient expects to be discharged to:: Private residence Living Arrangements: Spouse/significant other Available Help at Discharge: Family;Available 24 hours/day Type of Home: House Home Access: Ramped entrance     Home Layout: One level     Bathroom Shower/Tub: Walk-in shower;Sponge bathes at baseline   Bathroom Toilet: Handicapped height Bathroom Accessibility: Yes   Home Equipment: Agricultural consultant (2 wheels);Wheelchair - manual;BSC/3in1;Other (comment);Rollator (4 wheels);Cane - quad (specialized cane)          Prior Functioning/Environment               Mobility Comments: Quad cane all the time for mobility ADLs Comments: ind, manages own meds, spouse provides transportation    OT Problem List: Decreased strength;Decreased range of motion;Decreased activity tolerance;Impaired balance (sitting and/or standing)   OT Treatment/Interventions: Self-care/ADL training;Therapeutic exercise;Energy conservation;DME and/or AE instruction;Therapeutic activities;Patient/family education;Balance training      OT Goals(Current goals can be found in the care plan section)   Acute Rehab OT Goals Patient Stated Goal: none stated OT Goal Formulation: With patient Time For Goal Achievement: 05/15/23 Potential to Achieve Goals: Good ADL Goals Pt Will Perform Upper Body Dressing: Independently;sitting Pt Will Perform Lower Body Dressing: Independently;sitting/lateral leans;sit to/from stand Pt Will Transfer to Toilet: Independently;ambulating;regular height toilet   OT Frequency:  Min 1X/week    Co-evaluation              AM-PAC OT "6 Clicks" Daily Activity     Outcome Measure Help from another person eating meals?: None Help from another person taking care of personal grooming?: None Help from another person toileting, which includes  using toliet, bedpan, or urinal?: A Little Help from another person bathing (including washing, rinsing, drying)?: A Little Help from another person to put on and taking off regular upper body clothing?: A Little Help from another person to put on and taking off regular lower body clothing?: A Little 6 Click Score: 20   End of Session Equipment Utilized During Treatment: Gait belt Nurse Communication: Mobility status  Activity Tolerance: Patient tolerated treatment well Patient left: in chair;with call bell/phone within reach  OT Visit Diagnosis: Unsteadiness on feet (R26.81);Other abnormalities of gait and mobility (R26.89);Muscle weakness (generalized) (M62.81)                Time: 4098-1191 OT Time Calculation (min): 17 min Charges:  OT General Charges $OT Visit: 1 Visit OT Evaluation $OT Eval Low Complexity: 1 Low  Levell Tavano K, OTD, OTR/L SecureChat Preferred Acute Rehab [phone]   Benedict Brain Koonce 05/01/2023, 11:57 AM

## 2023-05-01 NOTE — Plan of Care (Signed)
  Problem: Coping: Goal: Ability to adjust to condition or change in health will improve Outcome: Progressing   Problem: Fluid Volume: Goal: Ability to maintain a balanced intake and output will improve Outcome: Progressing   Problem: Health Behavior/Discharge Planning: Goal: Ability to identify and utilize available resources and services will improve Outcome: Progressing Goal: Ability to manage health-related needs will improve Outcome: Progressing   Problem: Metabolic: Goal: Ability to maintain appropriate glucose levels will improve Outcome: Progressing   Problem: Nutritional: Goal: Maintenance of adequate nutrition will improve Outcome: Progressing Goal: Progress toward achieving an optimal weight will improve Outcome: Progressing   Problem: Skin Integrity: Goal: Risk for impaired skin integrity will decrease Outcome: Progressing   Problem: Education: Goal: Knowledge of General Education information will improve Description: Including pain rating scale, medication(s)/side effects and non-pharmacologic comfort measures Outcome: Progressing   Problem: Health Behavior/Discharge Planning: Goal: Ability to manage health-related needs will improve Outcome: Progressing   Problem: Clinical Measurements: Goal: Ability to maintain clinical measurements within normal limits will improve Outcome: Progressing Goal: Will remain free from infection Outcome: Progressing Goal: Diagnostic test results will improve Outcome: Progressing Goal: Respiratory complications will improve Outcome: Progressing Goal: Cardiovascular complication will be avoided Outcome: Progressing   Problem: Activity: Goal: Risk for activity intolerance will decrease Outcome: Progressing   Problem: Nutrition: Goal: Adequate nutrition will be maintained Outcome: Progressing   Problem: Coping: Goal: Level of anxiety will decrease Outcome: Progressing   Problem: Elimination: Goal: Will not  experience complications related to bowel motility Outcome: Progressing Goal: Will not experience complications related to urinary retention Outcome: Progressing   Problem: Pain Managment: Goal: General experience of comfort will improve and/or be controlled Outcome: Progressing   Problem: Safety: Goal: Ability to remain free from injury will improve Outcome: Progressing   Problem: Skin Integrity: Goal: Risk for impaired skin integrity will decrease Outcome: Progressing

## 2023-05-01 NOTE — Progress Notes (Signed)
 PHARMACY - ANTICOAGULATION CONSULT NOTE  Pharmacy Consult for heparin  Indication: atrial fibrillation  Allergies  Allergen Reactions   Epoetin  Alfa Other (See Comments)    Unknown    Ferumoxytol  Other (See Comments)    Unknown    Morphine Sulfate Rash and Other (See Comments)    Itches all over, red spots    Patient Measurements: Height: 5\' 10"  (177.8 cm) Weight: 91.4 kg (201 lb 8 oz) IBW/kg (Calculated) : 73 HEPARIN  DW (KG): 89.4  Vital Signs: Temp: 97.7 F (36.5 C) (04/21 0730) Temp Source: Oral (04/21 0730) BP: 123/53 (04/21 0800) Pulse Rate: 82 (04/21 0800)  Labs: Recent Labs    04/29/23 1333 04/30/23 0127 04/30/23 0131 04/30/23 0440 04/30/23 0936 05/01/23 0509  HGB 8.4* 8.4*  --   --   --  9.0*  HCT 25.0* 25.2*  --   --   --  26.6*  PLT 185 175  --   --   --  231  APTT  --   --   --   --   --  78*  HEPARINUNFRC  --   --   --   --   --  0.87*  CREATININE  --   --    < > 5.09* 5.93* 7.17*  TROPONINIHS  --   --   --   --  6,990*  --    < > = values in this interval not displayed.    Estimated Creatinine Clearance: 12.3 mL/min (A) (by C-G formula based on SCr of 7.17 mg/dL (H)).   Medical History: Past Medical History:  Diagnosis Date   AICD (automatic cardioverter/defibrillator) present    [employer]   Allergic rhinitis    Anemia    Arthritis    Chronic systolic heart failure (HCC)    a. ECHO (12/2012) EF 25-30%, HK entireanteroseptal myocardium //  b.  EF 25%, diffuse HK, grade 1 diastolic dysfunction, MAC, mild LAE, normal RVSF, trivial pericardial effusion   COPD (chronic obstructive pulmonary disease) (HCC)    Diabetes mellitus type II    Diabetic nephropathy (HCC)    Diabetic neuropathy (HCC)    ESRD on hemodialysis (HCC)    started HD June 2017, goes to [facility] HD unit, Dr Lieutenant Reese   History of cardiac catheterization    a.Myoview  1/15:  There is significant left ventricular dysfunction. There may be slight scar at the apex. There  is no significant ischemia. LV Ejection Fraction: 27%  //  b. RHC/LHC (1/15) with mean RA 6, PA 47/22 mean 33, mean PCWP 20, PVR 2.5 WU, CI 2.5; 80% dLAD stenosis, 70% diffuse large D.     History of kidney stones    Hyperlipidemia    Hypertension    Kidney stones    NICM (nonischemic cardiomyopathy) (HCC)    Primarily nonischemic.  Echo (12/14) with EF 25-30%.  Echo (3/15) with EF 25%, mild to moderately dilated LV, normal RV size and systolic function.     Osteomyelitis (HCC)    left fifth ray   Pneumonia    Urethral stricture    Wears glasses     Medications:  Medications Prior to Admission  Medication Sig Dispense Refill Last Dose/Taking   acetaminophen  (TYLENOL ) 325 MG tablet Take 1-2 tablets (325-650 mg total) by mouth every 4 (four) hours as needed for mild pain.   04/26/2023   apixaban  (ELIQUIS ) 5 MG TABS tablet Take 1 tablet (5 mg total) by mouth 2 (two) times daily. 180 tablet  3 04/26/2023 at  6:00 PM   aspirin  EC 81 MG tablet Take 1 tablet (81 mg total) by mouth daily. Swallow whole. 30 tablet 2 04/25/2023   atorvastatin  (LIPITOR ) 80 MG tablet TAKE 1 TABLET BY MOUTH AT BEDTIME (Patient taking differently: Take 80 mg by mouth daily.) 90 tablet 3 04/26/2023   carvedilol  (COREG ) 12.5 MG tablet Take 1 tablet (12.5 mg total) by mouth 2 (two) times daily. 180 tablet 3 04/26/2023   cinacalcet  (SENSIPAR ) 30 MG tablet Take 1 tablet (30 mg total) by mouth Every Tuesday,Thursday,and Saturday with dialysis. 60 tablet  04/27/2023   clopidogrel  (PLAVIX ) 75 MG tablet Take 1 tablet (75 mg total) by mouth daily with breakfast. 30 tablet 2 04/26/2023   Continuous Glucose Receiver (FREESTYLE LIBRE 3 READER) DEVI Use to check blood glucose TID 1 each 0 Taking   Continuous Glucose Sensor (FREESTYLE LIBRE 3 PLUS SENSOR) MISC Change sensor every 15 days. Use as directed to check glucose daily 6 each 3 Taking   gabapentin  (NEURONTIN ) 300 MG capsule Take 1 capsule (300 mg total) by mouth at bedtime. 30 capsule  2 04/26/2023 Bedtime   HUMALOG  KWIKPEN 200 UNIT/ML KwikPen INJECT A MAXIMUM OF 28 UNITS SUBCUTANEOUSLY TWICE DAILY WITH LUNCH AND SUPPER PER SLIDING SCALE. APPOINTMENT REQUIRED FOR FUTURE REFILLS 6 mL 5 04/26/2023   LANTUS  SOLOSTAR 100 UNIT/ML Solostar Pen INJECT 12 UNITS SUBCUTANEOUSLY AT BEDTIME (Patient taking differently: Inject 14 Units into the skin at bedtime.) 15 mL 0 04/26/2023   midodrine  (PROAMATINE ) 10 MG tablet Take 10 mg by mouth 3 (three) times a week. EVERY TUESDAY, THURSDAY, AND SATURDAY   04/27/2023   multivitamin (RENA-VIT) TABS tablet Take 1 tablet by mouth once daily (Patient taking differently: Take 1 tablet by mouth daily.) 10 tablet 3 04/25/2023   nitroGLYCERIN  (NITROSTAT ) 0.4 MG SL tablet Place 1 tablet (0.4 mg total) under the tongue every 5 (five) minutes as needed for chest pain. 30 tablet 1 Unknown   Olopatadine  HCl 0.2 % SOLN Place 1 drop into both eyes daily as needed (for allergies).    Unknown   sevelamer  (RENAGEL ) 800 MG tablet Take 1,600 mg by mouth 3 (three) times daily with meals.   04/26/2023   VENTOLIN  HFA 108 (90 Base) MCG/ACT inhaler Inhale 1-2 puffs into the lungs every 6 (six) hours as needed for wheezing or shortness of breath. 18 g 1 Unknown   glucose blood test strip Check 1 time daily. E11.9 One Touch Ultra Blue Test Strips 100 each 3    Insulin  Pen Needle (BD PEN NEEDLE NANO U/F) 32G X 4 MM MISC USE 1 PEN NEEDLE SUBCUTANEOUSLY WITH INSULIN  4 TIMES DAILY 400 each 0     Assessment: 63 y.o. M presents with Hgb 5.4, FOBT+. Pt noted some melena since PCI (4/4) - pt has been on ASA 81, plavix , and Eliquis  pta for afib and recent PCI. Eliquis  held since admission. Last dose 4/16 1800. Hgb improved to 8.4 (required 4 units PRBC this admission). EGD showed nonbleeding duodenal ulcers.   Okay to begin heparin  per cardiology (as pt went into afib overnight) - will aim for low end of therapeutic range and no boluses with recent GI bleed. Eliquis  likely still affecting  heparin  level so will utilize aPTT until levels correlating.  Heparin  level supratherapeutic (0.87) due to recent DOAC use. aPTT therapeutic (78) on 1000 units/hr. Level drawn appropriately from opposite side of heparin  infusion. No issues with infusion or s/sx of bleeding per RN. CBC stable.  Goal of Therapy:  Heparin  level 0.3-0.5 units/ml aPTT 66-85 seconds Monitor platelets by anticoagulation protocol: Yes   Plan:  Continue heparin  1000 units/hr F/u 8hr confirmatory aPTT Daily heparin  level, aPTT, and CBC Monitor Hgb and for any s/s bleeding F/u plans to transition back to Eliquis   Abelina Abide, PharmD PGY1 Pharmacy Resident 05/01/2023 8:31 AM

## 2023-05-01 NOTE — Progress Notes (Addendum)
 Progress Note  Patient Name: ELOHIM BRUNE Date of Encounter: 05/01/2023  Primary Cardiologist: Armida Lander, MD  Subjective   No CP or SOB. Maintaining NSR. Reports that he had 2 dark stools yesterday (last 10pm) but without acute bleeding. No stools yet this AM.  Inpatient Medications    Scheduled Meds:  sodium chloride    Intravenous Once   sodium chloride    Intravenous Once   amiodarone   200 mg Oral BID   Followed by   Cecily Cohen ON 05/21/2023] amiodarone   200 mg Oral Daily   atorvastatin   80 mg Oral QPM   Chlorhexidine  Gluconate Cloth  6 each Topical Daily   Chlorhexidine  Gluconate Cloth  6 each Topical Q0600   Chlorhexidine  Gluconate Cloth  6 each Topical Q0600   cinacalcet   30 mg Oral Q T,Th,Sa-HD   clopidogrel   75 mg Oral Daily   fidaxomicin   200 mg Oral BID   gabapentin   300 mg Oral QHS   insulin  aspart  0-15 Units Subcutaneous TID WC   insulin  glargine-yfgn  14 Units Subcutaneous Daily   midodrine   10 mg Oral Q8H   multivitamin  1 tablet Oral QHS   pantoprazole  (PROTONIX ) IV  40 mg Intravenous Q12H   sevelamer  carbonate  1,600 mg Oral TID WC   sodium chloride  flush  3 mL Intravenous Q12H   Continuous Infusions:  heparin  1,000 Units/hr (05/01/23 1031)   PRN Meds: acetaminophen  **OR** acetaminophen , albuterol , ondansetron  **OR** ondansetron  (ZOFRAN ) IV, mouth rinse, sodium chloride  flush   Vital Signs    Vitals:   05/01/23 0930 05/01/23 0945 05/01/23 1000 05/01/23 1015  BP:   109/86   Pulse: 90 85 90 82  Resp: (!) 21 (!) 22 (!) 28 (!) 28  Temp:      TempSrc:      SpO2: 100% 100% 100% 99%  Weight:      Height:        Intake/Output Summary (Last 24 hours) at 05/01/2023 1106 Last data filed at 05/01/2023 1000 Gross per 24 hour  Intake 1055.93 ml  Output --  Net 1055.93 ml      04/29/2023    4:36 PM 04/29/2023    1:11 PM 04/27/2023    2:27 PM  Last 3 Weights  Weight (lbs) 201 lb 8 oz 204 lb 9.4 oz --  Weight (kg) 91.4 kg 92.8 kg --     Telemetry     NSR occasional PACs, PVCs - Personally Reviewed  Physical Exam   GEN: No acute distress.  HEENT: Normocephalic, atraumatic, sclera non-icteric. Neck: No JVD or bruits. Cardiac: RRR no murmurs, rubs, or gallops.  Respiratory: Clear to auscultation bilaterally. Breathing is unlabored. GI: Soft, nontender, non-distended, BS +x 4. MS: no deformity. Extremities: No clubbing or cyanosis. S/p bilat BKA Neuro:  AAOx3. Follows commands. Psych:  Responds to questions appropriately with a normal affect.  Labs    High Sensitivity Troponin:   Recent Labs  Lab 04/12/23 0656 04/12/23 0817 04/27/23 0404 04/27/23 0657 04/30/23 0936  TROPONINIHS 12,078* 11,346* 345* 387* 6,990*      Cardiac EnzymesNo results for input(s): "TROPONINI" in the last 168 hours. No results for input(s): "TROPIPOC" in the last 168 hours.   Chemistry Recent Labs  Lab 04/27/23 0404 04/28/23 1610 04/30/23 0440 04/30/23 0936 05/01/23 0509  NA 130*   < > 121* 133* 134*  K 3.5   < > 3.5 3.9 3.7  CL 91*   < > 86* 93* 96*  CO2 19*   < >  20* 23 23  GLUCOSE 197*   < > 561* 234* 178*  BUN 95*   < > 38* 45* 56*  CREATININE 8.20*   < > 5.09* 5.93* 7.17*  CALCIUM  8.3*   < > 7.3* 8.3* 7.9*  PROT 5.9*  --   --  5.9*  --   ALBUMIN  2.6*   < > 2.0* 2.2* 2.1*  AST 28  --   --  47*  --   ALT 22  --   --  25  --   ALKPHOS 62  --   --  71  --   BILITOT 0.4  --   --  0.8  --   GFRNONAA 7*   < > 12* 10* 8*  ANIONGAP 20*   < > 15 17* 15   < > = values in this interval not displayed.     Hematology Recent Labs  Lab 04/29/23 1333 04/30/23 0127 05/01/23 0509  WBC 17.7* 17.4* 14.3*  RBC 2.79* 2.80* 2.92*  HGB 8.4* 8.4* 9.0*  HCT 25.0* 25.2* 26.6*  MCV 89.6 90.0 91.1  MCH 30.1 30.0 30.8  MCHC 33.6 33.3 33.8  RDW 16.0* 16.5* 16.9*  PLT 185 175 231    BNPNo results for input(s): "BNP", "PROBNP" in the last 168 hours.   DDimer No results for input(s): "DDIMER" in the last 168 hours.   Radiology    DG CHEST  PORT 1 VIEW Result Date: 04/30/2023 CLINICAL DATA:  Rapid response for hypotension and shortness of breath. EXAM: PORTABLE CHEST 1 VIEW COMPARISON:  04/10/2023 FINDINGS: Right IJ CVC tip in the right atrium. Cardiomegaly. Left chest wall ICD. Aortic atherosclerotic calcification. Pulmonary vascular congestion. Left lower lung airspace opacities. Small bilateral pleural effusions. No pneumothorax. No displaced rib fractures. IMPRESSION: 1. Cardiomegaly with pulmonary vascular congestion and small bilateral pleural effusions. 2. Left lower lung airspace opacities, atelectasis versus pneumonia. Electronically Signed   By: Rozell Cornet M.D.   On: 04/30/2023 01:13    Cardiac Studies   2d echo 04/11/23   1. Left ventricular ejection fraction, by estimation, is 40%. The left  ventricle has mildly decreased function. The left ventricle demonstrates  global hypokinesis. There is moderate left ventricular hypertrophy. Left  ventricular diastolic parameters are   consistent with Grade I diastolic dysfunction (impaired relaxation).  Elevated left atrial pressure.   2. Right ventricular systolic function is normal. The right ventricular  size is normal.   3. The mitral valve is normal in structure. No evidence of mitral valve  regurgitation. No evidence of mitral stenosis.   4. The aortic valve is tricuspid. Aortic valve regurgitation is not  visualized. No aortic stenosis is present.   5. The inferior vena cava is normal in size with greater than 50%  respiratory variability, suggesting right atrial pressure of 3 mmHg.   Patient Profile     63 y.o. male with chronic HFrEF/NICM s/p Bos Sci S-ICD 06/2013 subsequently left in place after battery ran out, CAD (2015 diffuse disease managed medically, recent NSTEMI 04/2023 s/p 3v CAD s/p bifurcation stenting of LAD/D1 04/12/23 then staged 2-site PCI to RCA 04/14/23), ESRD on HD, HTN also with issues with hypotension, HLD, DM2, mild carotid disease (1-39% BICA  12/2022), LBBB, moderate MR by echo 08/2022 not seen on 04/2023 echo, osteomyelitis, bilateral BKA, prior gangrenous wound, anemia, arthritis, kidney stones. Recently admitted 3/31-4/5 with NSTEMI with PCI as above, EF 40%, went home on triple therapy. Returned to the hospital this admission with  n/v, diarrhea, dark stools, coffee ground emesis. Found to have Hgb 5.6, transfused. Also c. Diff positive started on abx. Underwent EGD with superficial duodenal ulcers, no high risk lesions. Transferred to ICU 4/20 due to AF RVR.  Assessment & Plan    1. UGIB with ABL anemia - s/p EGD this admission with superficial duodenal ulcers, no high risk lesions. GI gave OK to continue Plavix  and trial heparin  drip and if Hgb remained stable with no further bleeding, transition to Eliquis . Reports 2 dark stools yesterday still but no additional stools yet today. Off Levophed  and Hgb now stable, follow closely. If recurrent drop in Hgb or persistence of melena, will need to d/c heparin  altogether  2. PAF with AF RVR/hypotension 4/19 into 04/30/23 - required IV amio with transition to oral form - maintaining NSR with plan to continue 200mg  BID x 3 weeks then 200mg  once daily - anticoag plan as above - baseline TSH with AM labs - to consider [facility] as outpatient if he can tolerate antiplatelet therapy  3. CAD with recent NSTEMI s/p staged PCI early April - ASA discontinued due to #1, to continue Plavix  as we are able - continue atorvastatin  - not on BB due to hypotension issues  4. Chronic HFrEF - GDMT historically limited due to hypotension - volume managed with MD  5. C diff colitis - on abx  6. ESRD on HD - per nephrology  For questions or updates, please contact [facility] Please consult [email or web address] for contact info under Cardiology/STEMI.  Signed, Camber Ninh N Zelina Jimerson, PA-C 05/01/2023, 11:06 AM

## 2023-05-01 NOTE — TOC CM/SW Note (Signed)
 Transition of Care [facility]) - Inpatient Brief Assessment   Patient Details  Name: Anthony Bullock MRN: [id number] Date of Birth: [birth year]  Transition of Care [facility]) CM/SW Contact:    Tom-Johnson, Angelique Ken, RN Phone Number: 05/01/2023, 1:23 PM   Clinical Narrative:  Patient presented to the ED with N/V/D with dark Stools x 3 days. Labs showed BUN 95, Creatinine 8.2, K+ 3.5, Hgb 5.6.  Patient PRBC and admitted with Acute Blood Loss Anemia. On Heparin  gtt, Dificid  for C-diff.  Has hx of recent NSTEMI with DES to LAD/RCA, Heart Failure with mildly reduced EF, LBBB, mildly ICD 2015 left in place after battery ran out, Paroxysmal A-Fib, Hyperlipidemia, Hypertension, PVD s/p bilateral BKA 2018, Osteomyelitis ESRD on TTS outpatient HD schedule in [place]. Cardiology, Nephrology following.   From home with wife and mother in-law. Does not have children. Not employed, on disability. Has all necessary DME's at home including bilateral Prosthesis and CPAP.  PCP is Burchette, Marijean Shouts, MD and uses [facility] on [facility] in [place].   Outpatient PT recommended by PT, patient declined stating it is a waste of time and money. Patient educated reason for Therapy.   Patient not Medically ready for discharge.  CM will continue to follow as patient progresses with care towards discharge.         Transition of Care Asessment: Insurance and Status: Insurance coverage has been reviewed Patient has primary care physician: Yes Home environment has been reviewed: Yes Prior level of function:: Modified Independent, Bilt BKA with Prosthesis. Prior/Current Home Services: No current home services Social Drivers of Health Review: SDOH reviewed no interventions necessary Readmission risk has been reviewed: Yes Transition of care needs: transition of care needs identified, TOC will continue to follow

## 2023-05-01 NOTE — Progress Notes (Signed)
 [facility] NEPHROLOGY PROGRESS NOTE  Assessment/ Plan: Pt is a 63 y.o. yo male   OP HD:  [facility] From 04/14/23 --> 3.5h  B400   85.5kg  1K bath  TDC   Heparin  none  # GI bleed: w/ melena, N/V, Hb 5.6 on admit. Duodenal ulcer per endoscopy. Hb up 8-9 range after prbc's. Per pmd / GI.   # Cdif colitis: on fidaxomicin  x 10d course per GI  # ESRD: on HD TTS. Next HD 4/22  # BP/volume:  takes midodrine  pre HD at home. Started on midodrine  10mg  tid here. UF with HD.   #Anemia of esrd: Hb low, getting prbc's, and GI work-up as above.  #Secondary hyperparathyroidism: CCa and phos in range, cont binders w/ meals.  #PAD sp bilat BKA.  Subjective: Seen and examined.  Denies nausea, vomiting, chest pain, shortness of breath.  Continued to have diarrhea.  Discussed the primary team. Objective Vital signs in last 24 hours: Vitals:   05/01/23 1130 05/01/23 1145 05/01/23 1200 05/01/23 1215  BP:   106/66   Pulse: 86 80 80 79  Resp: 16 (!) 26 (!) 29 (!) 21  Temp:      TempSrc:      SpO2: 100% 100% 100% 99%  Weight:      Height:       Weight change:   Intake/Output Summary (Last 24 hours) at 05/01/2023 1326 Last data filed at 05/01/2023 1200 Gross per 24 hour  Intake 746.38 ml  Output --  Net 746.38 ml       Labs: RENAL PANEL Recent Labs  Lab 04/28/23 0639 04/29/23 0437 04/30/23 0131 04/30/23 0440 04/30/23 0936 05/01/23 0509  NA 130* 132* 133* 121* 133* 134*  K 4.2 4.0 3.7 3.5 3.9 3.7  CL 91* 95* 96* 86* 93* 96*  CO2 18* 20* 24 20* 23 23  GLUCOSE 273* 252* 148* 561* 234* 178*  BUN 55* 73* 40* 38* 45* 56*  CREATININE 5.93* 7.41* 5.29* 5.09* 5.93* 7.17*  CALCIUM  8.1* 8.3* 8.1* 7.3* 8.3* 7.9*  PHOS 5.6* 5.1*  --  3.9  --  4.7*  ALBUMIN  2.3* 2.1*  --  2.0* 2.2* 2.1*    Liver Function Tests: Recent Labs  Lab 04/27/23 0404 04/28/23 0639 04/30/23 0440 04/30/23 0936 05/01/23 0509  AST 28  --   --  47*  --   ALT 22  --   --  25  --   ALKPHOS 62   --   --  71  --   BILITOT 0.4  --   --  0.8  --   PROT 5.9*  --   --  5.9*  --   ALBUMIN  2.6*   < > 2.0* 2.2* 2.1*   < > = values in this interval not displayed.   Recent Labs  Lab 04/27/23 0404  LIPASE 34   No results for input(s): "AMMONIA" in the last 168 hours. CBC: Recent Labs    10/19/22 1350 03/06/23 2136 04/28/23 1820 04/29/23 0437 04/29/23 1333 04/30/23 0127 05/01/23 0509  HGB  --    < > 9.8* 8.7* 8.4* 8.4* 9.0*  MCV  --    < >  --  88.2 89.6 90.0 91.1  FERRITIN 954*  --   --   --   --   --   --   TIBC 359  --   --   --   --   --   --  IRON 78  --   --   --   --   --   --    < > = values in this interval not displayed.    Cardiac Enzymes: No results for input(s): "CKTOTAL", "CKMB", "CKMBINDEX", "TROPONINI" in the last 168 hours. CBG: Recent Labs  Lab 04/30/23 1527 04/30/23 1704 04/30/23 2339 05/01/23 0729 05/01/23 1106  GLUCAP 298* 289* 266* 169* 286*    Iron Studies: No results for input(s): "IRON", "TIBC", "TRANSFERRIN", "FERRITIN" in the last 72 hours. Studies/Results: DG CHEST PORT 1 VIEW Result Date: 04/30/2023 CLINICAL DATA:  Rapid response for hypotension and shortness of breath. EXAM: PORTABLE CHEST 1 VIEW COMPARISON:  04/10/2023 FINDINGS: Right IJ CVC tip in the right atrium. Cardiomegaly. Left chest wall ICD. Aortic atherosclerotic calcification. Pulmonary vascular congestion. Left lower lung airspace opacities. Small bilateral pleural effusions. No pneumothorax. No displaced rib fractures. IMPRESSION: 1. Cardiomegaly with pulmonary vascular congestion and small bilateral pleural effusions. 2. Left lower lung airspace opacities, atelectasis versus pneumonia. Electronically Signed   By: Rozell Cornet M.D.   On: 04/30/2023 01:13    Medications: Infusions:  heparin  1,000 Units/hr (05/01/23 1200)    Scheduled Medications:  sodium chloride    Intravenous Once   sodium chloride    Intravenous Once   amiodarone   200 mg Oral BID   Followed by    Cecily Cohen ON 05/21/2023] amiodarone   200 mg Oral Daily   atorvastatin   80 mg Oral QPM   Chlorhexidine  Gluconate Cloth  6 each Topical Daily   Chlorhexidine  Gluconate Cloth  6 each Topical Q0600   Chlorhexidine  Gluconate Cloth  6 each Topical Q0600   cinacalcet   30 mg Oral Q T,Th,Sa-HD   clopidogrel   75 mg Oral Daily   fidaxomicin   200 mg Oral BID   gabapentin   300 mg Oral QHS   insulin  aspart  0-15 Units Subcutaneous TID WC   insulin  glargine-yfgn  14 Units Subcutaneous Daily   midodrine   10 mg Oral Q8H   multivitamin  1 tablet Oral QHS   pantoprazole  (PROTONIX ) IV  40 mg Intravenous Q12H   sevelamer  carbonate  1,600 mg Oral TID WC   sodium chloride  flush  3 mL Intravenous Q12H    have reviewed scheduled and prn medications.  Physical Exam: General:NAD, comfortable Heart:RRR, s1s2 nl Lungs:clear b/l, no crackle Abdomen:soft, Non-tender, non-distended Extremities:No edema Dialysis Access: TDC.  Emy Angevine Prasad Constanza Mincy 05/01/2023,1:26 PM  LOS: 4 days

## 2023-05-02 ENCOUNTER — Other Ambulatory Visit (HOSPITAL_COMMUNITY): Payer: Self-pay

## 2023-05-02 DIAGNOSIS — D631 Anemia in chronic kidney disease: Secondary | ICD-10-CM

## 2023-05-02 DIAGNOSIS — Z89511 Acquired absence of right leg below knee: Secondary | ICD-10-CM

## 2023-05-02 DIAGNOSIS — I480 Paroxysmal atrial fibrillation: Secondary | ICD-10-CM | POA: Diagnosis not present

## 2023-05-02 DIAGNOSIS — I5022 Chronic systolic (congestive) heart failure: Secondary | ICD-10-CM

## 2023-05-02 DIAGNOSIS — N189 Chronic kidney disease, unspecified: Secondary | ICD-10-CM

## 2023-05-02 DIAGNOSIS — A0472 Enterocolitis due to Clostridium difficile, not specified as recurrent: Secondary | ICD-10-CM | POA: Diagnosis not present

## 2023-05-02 DIAGNOSIS — Z89512 Acquired absence of left leg below knee: Secondary | ICD-10-CM

## 2023-05-02 DIAGNOSIS — D62 Acute posthemorrhagic anemia: Secondary | ICD-10-CM | POA: Diagnosis not present

## 2023-05-02 LAB — CBC WITH DIFFERENTIAL/PLATELET
Abs Immature Granulocytes: 0.12 10*3/uL — ABNORMAL HIGH (ref 0.00–0.07)
Basophils Absolute: 0.1 10*3/uL (ref 0.0–0.1)
Basophils Relative: 1 %
Eosinophils Absolute: 0.9 10*3/uL — ABNORMAL HIGH (ref 0.0–0.5)
Eosinophils Relative: 6 %
HCT: 30.7 % — ABNORMAL LOW (ref 39.0–52.0)
Hemoglobin: 10.1 g/dL — ABNORMAL LOW (ref 13.0–17.0)
Immature Granulocytes: 1 %
Lymphocytes Relative: 11 %
Lymphs Abs: 1.5 10*3/uL (ref 0.7–4.0)
MCH: 30.4 pg (ref 26.0–34.0)
MCHC: 32.9 g/dL (ref 30.0–36.0)
MCV: 92.5 fL (ref 80.0–100.0)
Monocytes Absolute: 1.4 10*3/uL — ABNORMAL HIGH (ref 0.1–1.0)
Monocytes Relative: 10 %
Neutro Abs: 9.8 10*3/uL — ABNORMAL HIGH (ref 1.7–7.7)
Neutrophils Relative %: 71 %
Platelets: 266 10*3/uL (ref 150–400)
RBC: 3.32 MIL/uL — ABNORMAL LOW (ref 4.22–5.81)
RDW: 17.2 % — ABNORMAL HIGH (ref 11.5–15.5)
WBC: 13.8 10*3/uL — ABNORMAL HIGH (ref 4.0–10.5)
nRBC: 0 % (ref 0.0–0.2)

## 2023-05-02 LAB — RENAL FUNCTION PANEL
Albumin: 2.2 g/dL — ABNORMAL LOW (ref 3.5–5.0)
Albumin: 2.1 g/dL — ABNORMAL LOW (ref 3.5–5.0)
Anion gap: 15 (ref 5–15)
Anion gap: 17 — ABNORMAL HIGH (ref 5–15)
BUN: 64 mg/dL — ABNORMAL HIGH (ref 8–23)
BUN: 62 mg/dL — ABNORMAL HIGH (ref 8–23)
CO2: 20 mmol/L — ABNORMAL LOW (ref 22–32)
CO2: 22 mmol/L (ref 22–32)
Calcium: 8.4 mg/dL — ABNORMAL LOW (ref 8.9–10.3)
Calcium: 8.3 mg/dL — ABNORMAL LOW (ref 8.9–10.3)
Chloride: 99 mmol/L (ref 98–111)
Chloride: 98 mmol/L (ref 98–111)
Creatinine, Ser: 8.92 mg/dL — ABNORMAL HIGH (ref 0.61–1.24)
Creatinine, Ser: 8.88 mg/dL — ABNORMAL HIGH (ref 0.61–1.24)
GFR, Estimated: 6 mL/min — ABNORMAL LOW (ref >60)
GFR, Estimated: 6 mL/min — ABNORMAL LOW (ref >60)
Glucose, Bld: 67 mg/dL — ABNORMAL LOW (ref 70–99)
Glucose, Bld: 127 mg/dL — ABNORMAL HIGH (ref 70–99)
Phosphorus: 4.8 mg/dL — ABNORMAL HIGH (ref 2.5–4.6)
Phosphorus: 5 mg/dL — ABNORMAL HIGH (ref 2.5–4.6)
Potassium: 3.8 mmol/L (ref 3.5–5.1)
Potassium: 3.9 mmol/L (ref 3.5–5.1)
Sodium: 134 mmol/L — ABNORMAL LOW (ref 135–145)
Sodium: 137 mmol/L (ref 135–145)

## 2023-05-02 LAB — CBC
HCT: 28.1 % — ABNORMAL LOW (ref 39.0–52.0)
Hemoglobin: 9.1 g/dL — ABNORMAL LOW (ref 13.0–17.0)
MCH: 30.3 pg (ref 26.0–34.0)
MCHC: 32.4 g/dL (ref 30.0–36.0)
MCV: 93.7 fL (ref 80.0–100.0)
Platelets: 245 10*3/uL (ref 150–400)
RBC: 3 MIL/uL — ABNORMAL LOW (ref 4.22–5.81)
RDW: 17.2 % — ABNORMAL HIGH (ref 11.5–15.5)
WBC: 12.3 10*3/uL — ABNORMAL HIGH (ref 4.0–10.5)
nRBC: 0.2 % (ref 0.0–0.2)

## 2023-05-02 LAB — HEPARIN LEVEL (UNFRACTIONATED): Heparin Unfractionated: 0.54 [IU]/mL (ref 0.30–0.70)

## 2023-05-02 LAB — TSH: TSH: 3.09 u[IU]/mL (ref 0.350–4.500)

## 2023-05-02 LAB — GLUCOSE, CAPILLARY
Glucose-Capillary: 90 mg/dL (ref 70–99)
Glucose-Capillary: 96 mg/dL (ref 70–99)

## 2023-05-02 LAB — HEPATITIS B SURFACE ANTIBODY, QUANTITATIVE: Hep B S AB Quant (Post): <3.5 m[IU]/mL — ABNORMAL LOW

## 2023-05-02 LAB — APTT: aPTT: 83 s — ABNORMAL HIGH (ref 24–36)

## 2023-05-02 MED ORDER — VANCOMYCIN HCL 125 MG PO CAPS: 125.0000 mg | ORAL_CAPSULE | ORAL | Status: DC

## 2023-05-02 MED ORDER — APIXABAN 5 MG PO TABS: 5.0000 mg | ORAL_TABLET | Freq: Two times a day (BID) | ORAL | Status: DC

## 2023-05-02 MED ORDER — ALTEPLASE 2 MG IJ SOLR: 2.0000 mg | Freq: Once | INTRAMUSCULAR | Status: DC | PRN

## 2023-05-02 MED ORDER — LIDOCAINE HCL (PF) 1 % IJ SOLN: 5.0000 mL | INTRAMUSCULAR | Status: DC | PRN

## 2023-05-02 MED ORDER — ANTICOAGULANT SODIUM CITRATE 4% (200MG/5ML) IV SOLN: 5.0000 mL | Status: DC | PRN

## 2023-05-02 MED ORDER — VANCOMYCIN HCL 125 MG PO CAPS
ORAL_CAPSULE | ORAL | 0 refills | Status: AC
  Filled 2023-05-02: qty 100, 84d supply, fill #0

## 2023-05-02 MED ORDER — HEPARIN SODIUM (PORCINE) 1000 UNIT/ML DIALYSIS
1000.0000 [IU] | INTRAMUSCULAR | Status: DC | PRN
  Administered 2023-05-02: 3800 [IU]
  Filled 2023-05-02: qty 1

## 2023-05-02 MED ORDER — LIDOCAINE-PRILOCAINE 2.5-2.5 % EX CREA: 1.0000 | TOPICAL_CREAM | CUTANEOUS | Status: DC | PRN

## 2023-05-02 MED ORDER — AMIODARONE HCL 200 MG PO TABS
ORAL_TABLET | ORAL | 0 refills | Status: DC
  Filled 2023-05-02: qty 70, 50d supply, fill #0

## 2023-05-02 MED ORDER — INSULIN GLARGINE-YFGN 100 UNIT/ML ~~LOC~~ SOLN: 12.0000 [IU] | Freq: Every day | SUBCUTANEOUS | Status: DC

## 2023-05-02 MED ORDER — PANTOPRAZOLE SODIUM 40 MG PO TBEC
40.0000 mg | DELAYED_RELEASE_TABLET | Freq: Two times a day (BID) | ORAL | 1 refills | Status: DC
  Filled 2023-05-02: qty 60, 30d supply, fill #0

## 2023-05-02 MED ORDER — VANCOMYCIN HCL 125 MG PO CAPS: 125.0000 mg | ORAL_CAPSULE | Freq: Every day | ORAL | Status: DC

## 2023-05-02 MED ORDER — VANCOMYCIN HCL 125 MG PO CAPS
125.0000 mg | ORAL_CAPSULE | Freq: Four times a day (QID) | ORAL | Status: DC
  Administered 2023-05-02: 125 mg via ORAL
  Filled 2023-05-02 (×3): qty 1

## 2023-05-02 MED ORDER — PENTAFLUOROPROP-TETRAFLUOROETH EX AERO: 1.0000 | INHALATION_SPRAY | CUTANEOUS | Status: DC | PRN

## 2023-05-02 MED ORDER — VANCOMYCIN HCL 125 MG PO CAPS: 125.0000 mg | ORAL_CAPSULE | Freq: Two times a day (BID) | ORAL | Status: DC

## 2023-05-02 MED ORDER — MIDODRINE HCL 10 MG PO TABS
10.0000 mg | ORAL_TABLET | Freq: Three times a day (TID) | ORAL | 1 refills | Status: DC
  Filled 2023-05-02: qty 90, 30d supply, fill #0

## 2023-05-02 NOTE — Progress Notes (Signed)
  [facility] NEPHROLOGY PROGRESS NOTE  Assessment/ Plan: Pt is a 63 y.o. yo male   OP HD:  [facility] From 04/14/23 --> 3.5h  B400   85.5kg  1K bath  TDC   Heparin  none  # GI bleed: w/ melena, N/V, Hb 5.6 on admit. Duodenal ulcer per endoscopy. Hb up 8-9 range after prbc's. Per pmd / GI.   # Cdif colitis: on fidaxomicin  x 10d course per GI  # ESRD: on HD TTS. HD today, tolerating well.   # BP/volume:  takes midodrine  pre HD at home. Started on midodrine  10mg  tid here. UF with HD.   #Anemia of esrd: Hb low, getting prbc's, and GI work-up as above.  #Secondary hyperparathyroidism: CCa and phos in range, cont binders w/ meals.  #PAD sp bilat BKA.  Subjective: Seen and examined.  No new event.  Denies nausea, vomiting and chest pain or shortness of breath.  Diarrhea is getting better. Objective Vital signs in last 24 hours: Vitals:   05/02/23 1140 05/02/23 1141 05/02/23 1152 05/02/23 1155  BP:  130/62 (!) 124/55   Pulse:   79   Resp:   (!) 23   Temp: 97.8 F (36.6 C)  97.7 F (36.5 C)   TempSrc: Oral  Oral   SpO2:   98%   Weight:    85.3 kg  Height:       Weight change:   Intake/Output Summary (Last 24 hours) at 05/02/2023 1323 Last data filed at 05/02/2023 1152 Gross per 24 hour  Intake 703.97 ml  Output 2500 ml  Net -1796.03 ml       Labs: RENAL PANEL Recent Labs  Lab 04/29/23 0437 04/30/23 0131 04/30/23 0440 04/30/23 0936 05/01/23 0509 05/02/23 0610 05/02/23 0913  NA 132*   < > 121* 133* 134* 134* 137  K 4.0   < > 3.5 3.9 3.7 3.8 3.9  CL 95*   < > 86* 93* 96* 99 98  CO2 20*   < > 20* 23 23 20* 22  GLUCOSE 252*   < > 561* 234* 178* 67* 127*  BUN 73*   < > 38* 45* 56* 64* 62*  CREATININE 7.41*   < > 5.09* 5.93* 7.17* 8.92* 8.88*  CALCIUM  8.3*   < > 7.3* 8.3* 7.9* 8.4* 8.3*  PHOS 5.1*  --  3.9  --  4.7* 4.8* 5.0*  ALBUMIN  2.1*  --  2.0* 2.2* 2.1* 2.2* 2.1*   < > = values in this interval not displayed.    Liver Function  Tests: Recent Labs  Lab 04/27/23 0404 04/28/23 0639 04/30/23 0936 05/01/23 0509 05/02/23 0610 05/02/23 0913  AST 28  --  47*  --   --   --   ALT 22  --  25  --   --   --   ALKPHOS 62  --  71  --   --   --   BILITOT 0.4  --  0.8  --   --   --   PROT 5.9*  --  5.9*  --   --   --   ALBUMIN  2.6*   < > 2.2* 2.1* 2.2* 2.1*   < > = values in this interval not displayed.   Recent Labs  Lab 04/27/23 0404  LIPASE 34   No results for input(s): "AMMONIA" in the last 168 hours. CBC: Recent Labs    10/19/22 1350 03/06/23 2136 04/29/23 1333 04/30/23 0127 05/01/23 0509 05/02/23 4098  05/02/23 0913  HGB  --    < > 8.4* 8.4* 9.0* 10.1* 9.1*  MCV  --    < > 89.6 90.0 91.1 92.5 93.7  FERRITIN 954*  --   --   --   --   --   --   TIBC 359  --   --   --   --   --   --   IRON 78  --   --   --   --   --   --    < > = values in this interval not displayed.    Cardiac Enzymes: No results for input(s): "CKTOTAL", "CKMB", "CKMBINDEX", "TROPONINI" in the last 168 hours. CBG: Recent Labs  Lab 05/01/23 1106 05/01/23 1708 05/01/23 2122 05/02/23 0802 05/02/23 1138  GLUCAP 286* 137* 129* 90 96    Iron Studies: No results for input(s): "IRON", "TIBC", "TRANSFERRIN", "FERRITIN" in the last 72 hours. Studies/Results: No results found.   Medications: Infusions:  anticoagulant sodium citrate       Scheduled Medications:  amiodarone   200 mg Oral BID   Followed by   Cecily Cohen ON 05/21/2023] amiodarone   200 mg Oral Daily   atorvastatin   80 mg Oral QPM   Chlorhexidine  Gluconate Cloth  6 each Topical Daily   cinacalcet   30 mg Oral Q T,Th,Sa-HD   clopidogrel   75 mg Oral Daily   gabapentin   300 mg Oral QHS   insulin  aspart  0-15 Units Subcutaneous TID WC   insulin  aspart  5 Units Subcutaneous TID WC   [START ON 05/03/2023] insulin  glargine-yfgn  12 Units Subcutaneous Daily   midodrine   10 mg Oral Q8H   multivitamin  1 tablet Oral QHS   pantoprazole   40 mg Oral Q12H   sevelamer  carbonate   1,600 mg Oral TID WC   sodium chloride  flush  3 mL Intravenous Q12H   vancomycin   125 mg Oral QID   Followed by   Cecily Cohen ON 05/16/2023] vancomycin   125 mg Oral BID   Followed by   Cecily Cohen ON 05/24/2023] vancomycin   125 mg Oral Daily   Followed by   Cecily Cohen ON 05/31/2023] vancomycin   125 mg Oral QODAY   Followed by   Cecily Cohen ON 06/28/2023] vancomycin   125 mg Oral Q3 days    have reviewed scheduled and prn medications.  Physical Exam: General:NAD, comfortable Heart:RRR, s1s2 nl Lungs:clear b/l, no crackle Abdomen:soft, Non-tender, non-distended Extremities:No edema Dialysis Access: TDC.  Rue Valladares Prasad Lotus Santillo 05/02/2023,1:23 PM  LOS: 5 days

## 2023-05-02 NOTE — Progress Notes (Addendum)
 PHARMACY - ANTICOAGULATION CONSULT NOTE  Pharmacy Consult for heparin  > Eliquis  Indication: atrial fibrillation  Allergies  Allergen Reactions   Epoetin  Alfa Other (See Comments)    Unknown    Ferumoxytol  Other (See Comments)    Unknown    Morphine Sulfate Rash and Other (See Comments)    Itches all over, red spots    Patient Measurements: Height: 5\' 10"  (177.8 cm) Weight: 87.8 kg (193 lb 9 oz) IBW/kg (Calculated) : 73 HEPARIN  DW (KG): 89.4  Vital Signs: Temp: 97.6 F (36.4 C) (04/22 0806) Temp Source: Oral (04/22 0806) BP: 119/48 (04/22 0811) Pulse Rate: 81 (04/22 0811)  Labs: Recent Labs    04/30/23 0127 04/30/23 0936 05/01/23 0509 05/01/23 0904 05/01/23 1233 05/01/23 1857 05/02/23 0610 05/02/23 0913  HGB  --   --  9.0*  --   --   --  10.1* 9.1*  HCT  --   --  26.6*  --   --   --  30.7* 28.1*  PLT  --   --  231  --   --   --  266 245  APTT   < >  --  78* 75* 80* 53* 83*  --   HEPARINUNFRC  --   --  0.87* 0.56  --   --  0.54  --   CREATININE  --  5.93* 7.17*  --   --   --  8.92*  --   TROPONINIHS  --  6,990*  --   --   --   --   --   --    < > = values in this interval not displayed.    Estimated Creatinine Clearance: 9.7 mL/min (A) (by C-G formula based on SCr of 8.92 mg/dL (H)).   Medical History: Past Medical History:  Diagnosis Date   AICD (automatic cardioverter/defibrillator) present    [employer]   Allergic rhinitis    Anemia    Arthritis    Chronic systolic heart failure (HCC)    a. ECHO (12/2012) EF 25-30%, HK entireanteroseptal myocardium //  b.  EF 25%, diffuse HK, grade 1 diastolic dysfunction, MAC, mild LAE, normal RVSF, trivial pericardial effusion   COPD (chronic obstructive pulmonary disease) (HCC)    Diabetes mellitus type II    Diabetic nephropathy (HCC)    Diabetic neuropathy (HCC)    ESRD on hemodialysis (HCC)    started HD June 2017, goes to [facility] HD unit, Dr Lieutenant Reese   History of cardiac catheterization     a.Myoview  1/15:  There is significant left ventricular dysfunction. There may be slight scar at the apex. There is no significant ischemia. LV Ejection Fraction: 27%  //  b. RHC/LHC (1/15) with mean RA 6, PA 47/22 mean 33, mean PCWP 20, PVR 2.5 WU, CI 2.5; 80% dLAD stenosis, 70% diffuse large D.     History of kidney stones    Hyperlipidemia    Hypertension    Kidney stones    NICM (nonischemic cardiomyopathy) (HCC)    Primarily nonischemic.  Echo (12/14) with EF 25-30%.  Echo (3/15) with EF 25%, mild to moderately dilated LV, normal RV size and systolic function.     Osteomyelitis (HCC)    left fifth ray   Pneumonia    Urethral stricture    Wears glasses     Medications:  Medications Prior to Admission  Medication Sig Dispense Refill Last Dose/Taking   acetaminophen  (TYLENOL ) 325 MG tablet Take 1-2 tablets (325-650 mg total)  by mouth every 4 (four) hours as needed for mild pain.   04/26/2023   apixaban  (ELIQUIS ) 5 MG TABS tablet Take 1 tablet (5 mg total) by mouth 2 (two) times daily. 180 tablet 3 04/26/2023 at  6:00 PM   aspirin  EC 81 MG tablet Take 1 tablet (81 mg total) by mouth daily. Swallow whole. 30 tablet 2 04/25/2023   atorvastatin  (LIPITOR ) 80 MG tablet TAKE 1 TABLET BY MOUTH AT BEDTIME (Patient taking differently: Take 80 mg by mouth daily.) 90 tablet 3 04/26/2023   carvedilol  (COREG ) 12.5 MG tablet Take 1 tablet (12.5 mg total) by mouth 2 (two) times daily. 180 tablet 3 04/26/2023   cinacalcet  (SENSIPAR ) 30 MG tablet Take 1 tablet (30 mg total) by mouth Every Tuesday,Thursday,and Saturday with dialysis. 60 tablet  04/27/2023   clopidogrel  (PLAVIX ) 75 MG tablet Take 1 tablet (75 mg total) by mouth daily with breakfast. 30 tablet 2 04/26/2023   Continuous Glucose Receiver (FREESTYLE LIBRE 3 READER) DEVI Use to check blood glucose TID 1 each 0 Taking   Continuous Glucose Sensor (FREESTYLE LIBRE 3 PLUS SENSOR) MISC Change sensor every 15 days. Use as directed to check glucose daily 6 each  3 Taking   gabapentin  (NEURONTIN ) 300 MG capsule Take 1 capsule (300 mg total) by mouth at bedtime. 30 capsule 2 04/26/2023 Bedtime   HUMALOG  KWIKPEN 200 UNIT/ML KwikPen INJECT A MAXIMUM OF 28 UNITS SUBCUTANEOUSLY TWICE DAILY WITH LUNCH AND SUPPER PER SLIDING SCALE. APPOINTMENT REQUIRED FOR FUTURE REFILLS 6 mL 5 04/26/2023   LANTUS  SOLOSTAR 100 UNIT/ML Solostar Pen INJECT 12 UNITS SUBCUTANEOUSLY AT BEDTIME (Patient taking differently: Inject 14 Units into the skin at bedtime.) 15 mL 0 04/26/2023   midodrine  (PROAMATINE ) 10 MG tablet Take 10 mg by mouth 3 (three) times a week. EVERY TUESDAY, THURSDAY, AND SATURDAY   04/27/2023   multivitamin (RENA-VIT) TABS tablet Take 1 tablet by mouth once daily (Patient taking differently: Take 1 tablet by mouth daily.) 10 tablet 3 04/25/2023   nitroGLYCERIN  (NITROSTAT ) 0.4 MG SL tablet Place 1 tablet (0.4 mg total) under the tongue every 5 (five) minutes as needed for chest pain. 30 tablet 1 Unknown   Olopatadine  HCl 0.2 % SOLN Place 1 drop into both eyes daily as needed (for allergies).    Unknown   sevelamer  (RENAGEL ) 800 MG tablet Take 1,600 mg by mouth 3 (three) times daily with meals.   04/26/2023   VENTOLIN  HFA 108 (90 Base) MCG/ACT inhaler Inhale 1-2 puffs into the lungs every 6 (six) hours as needed for wheezing or shortness of breath. 18 g 1 Unknown   glucose blood test strip Check 1 time daily. E11.9 One Touch Ultra Blue Test Strips 100 each 3    Insulin  Pen Needle (BD PEN NEEDLE NANO U/F) 32G X 4 MM MISC USE 1 PEN NEEDLE SUBCUTANEOUSLY WITH INSULIN  4 TIMES DAILY 400 each 0     Assessment: 63 y.o. M presented with Hgb 5.4, FOBT+. Pt noted some melena since PCI (4/4) - pt has been on ASA 81, plavix , and Eliquis  pta for afib and recent PCI. Eliquis  held since admission. Last dose 4/16 1800. Hgb improved to 8.4 (required 4 units PRBC this admission). EGD showed nonbleeding duodenal ulcers. AC restarted with heparin  gtt per cardiology due episode of afib during  admission. CBC has remained stable, therefore okay to transition to Eliquis  after discussion with TRH and cardiology.  Goal of Therapy:  Monitor platelets by anticoagulation protocol: Yes   Plan:  Discontinue  heparin  gtt at time of first Eliquis  dose Start Eliquis  5mg  PO BID Monitor CBC and s/sx of bleeding Pharmacy will continue to monitor peripherally  Abelina Abide, PharmD PGY1 Pharmacy Resident 05/02/2023 10:01 AM  ADDENDUM: Anthony Bullock at bedside with cardiology. Plans are to discontinue all anticoagulation to allow time for patient to recover given risk of AC outweighs benefit at this time. Heparin  drip already discontinued. Eliquis  has been discontinued. RN aware.  Abelina Abide, PharmD PGY1 Pharmacy Resident 05/02/2023 10:33 AM

## 2023-05-02 NOTE — TOC Transition Note (Signed)
 Transition of Care [facility]) - Discharge Note   Patient Details  Name: Anthony Bullock MRN: [id number] Date of Birth: 1960-08-14  Transition of [facility]) CM/SW Contact:  Tom-Johnson, Angelique Ken, RN Phone Number: 05/02/2023, 12:45 PM   Clinical Narrative:     Patient is scheduled for discharge today.  Readmission Risk Assessment done. Outpatient f/u, hospital f/u and discharge instructions on AVS. Prescriptions sent to [facility] pharmacy and patient will receive meds prior discharge. Patient declined Outpatient PT. Wife, Amalia Badder to transport at discharge.  No further TOC needs noted.       Final next level of care: Home/Self Care (Declined Outpatient PT) Barriers to Discharge: Barriers Resolved   Patient Goals and CMS Choice Patient states their goals for this hospitalization and ongoing recovery are:: To return home CMS Medicare.gov Compare Post Acute Care list provided to:: Patient Choice offered to / list presented to : NA      Discharge Placement                Patient to be transferred to facility by: Wife Name of family member notified: Amalia Badder    Discharge Plan and Services Additional resources added to the After Visit Summary for                  DME Arranged: N/A DME Agency: NA       HH Arranged: NA HH Agency: NA        Social Drivers of Health (SDOH) Interventions SDOH Screenings   Food Insecurity: No Food Insecurity (04/27/2023)  Housing: Low Risk  (04/27/2023)  Transportation Needs: No Transportation Needs (04/27/2023)  Utilities: Not At Risk (04/27/2023)  Alcohol  Screen: Low Risk  (02/03/2023)  Depression (PHQ2-9): Low Risk  (02/03/2023)  Financial Resource Strain: Low Risk  (02/03/2023)  Physical Activity: Inactive (02/03/2023)  Social Connections: Socially Integrated (04/27/2023)  Stress: No Stress Concern Present (02/03/2023)  Tobacco Use: Medium Risk (04/28/2023)  Health Literacy: Adequate Health Literacy (02/03/2023)     Readmission Risk  Interventions    05/01/2023    1:22 PM 04/12/2023    3:14 PM 03/08/2023   10:44 AM  Readmission Risk Prevention Plan  Transportation Screening Complete Complete Complete  PCP or Specialist Appt within 3-5 Days   Complete  HRI or Home Care Consult   Complete  Social Work Consult for Recovery Care Planning/Counseling   Complete  Palliative Care Screening   Not Applicable  Medication Review Oceanographer) Referral to Pharmacy Referral to Pharmacy Complete  PCP or Specialist appointment within 3-5 days of discharge Complete    HRI or Home Care Consult Complete Complete   SW Recovery Care/Counseling Consult Complete Complete   Palliative Care Screening Not Applicable Not Applicable   Skilled Nursing Facility Not Applicable Not Applicable

## 2023-05-02 NOTE — Discharge Summary (Signed)
 Physician Discharge Summary   Patient: Anthony Bullock MRN: [id number] DOB: 10-Apr-1960  Admit date:     04/27/2023  Discharge date: 05/02/23  Discharge Physician: Jodeane Mulligan   PCP: Marquetta Sit, MD   Recommendations at discharge:   At this time patient will be discharged home with home health.  If you experience any symptoms such as fever, vomiting, shortness of breath, chest pain, abdominal pain, or other concerning symptoms, please call your primary care provider or go to the emergency department immediately.  Discharge Diagnoses: Principal Problem:   Acute blood loss anemia Active Problems:   UGI bleed   C. difficile colitis   Paroxysmal atrial fibrillation (HCC)   COPD (chronic obstructive pulmonary disease) (HCC)   Chronic systolic congestive heart failure (HCC)   CAD (coronary artery disease)   Peripheral vascular disease (HCC)   ESRD on dialysis (HCC)   Anemia due to chronic kidney disease   S/P bilateral below knee amputation (HCC)   Type 2 diabetes mellitus with peripheral neuropathy (HCC)   Dyslipidemia   Elevated troponin   Hyponatremia   Duodenal ulcer disease   S/P primary angioplasty with coronary stent   PAD (peripheral artery disease) (HCC)  Resolved Problems:   * No resolved hospital problems. *  Hospital Course: 63 y.o. M with ESRD on HD TThS, sCHF EF 40%, isch CM, HTN, HLD, pAF on Eliquis , DM, PVD s/p bilateral BKA, anemia and CAD with recent DES who presented with vomiting, diarrhea, and dark stools since discharge from his recent stents.  Found to have Hgb 5.4 g/dL, FOBT+, hypotension, and Cdiff.  Assessment and Plan:  Acute blood loss anemia 2/2 upper GI bleed - EGD noting superficial duodenal ulcers.  Initiated on PPI.  Anticoagulation held.  Bleeding appears to be resolved.  Hemoglobin stable.  Patient to continue Plavix .  Patient to discontinue Eliquis  with recommendations to hold until repeat visit to cardiology in the outpatient  setting.  Avoid NSAIDs.  Paroxysmal atrial fibrillation - Status post IV Amio.  Appears to be very resolved.  Transition to p.o. amiodarone  taper as ordered.  May consider watchman referral in future.  Chronic HFrEF - Volume improved after HD.  GDMT limited by hypotension.  C. difficile colitis - Initially transition from p.o. vancomycin  to fidaxomicin  secondary to high risk.  However fidaxomicin  prohibitively expensive for discharge.  Will transition back to p.o. vancomycin  with prolonged taper as prescribed.  ESRD on HD - Resuming TTS hemodialysis in the outpatient setting.  Hypotension - Currently on midodrine  3 times daily.  Will give prescription as such.  Followed by nephrology in the outpatient setting.  Physical debilitation increased weakness - Patient will need bilateral BKA.  Recent hospitalization.  Will resume home health therapy.       Consultants: Nephrology, cardiology, critical care Procedures performed: None Disposition: Home health by Diet recommendation:  Discharge Diet Orders (From admission, onward)     Start     Ordered   05/02/23 0000  Diet - low sodium heart healthy        05/02/23 1207           Renal diet DISCHARGE MEDICATION: Allergies as of 05/02/2023       Reactions   Epoetin  Alfa Other (See Comments)   Unknown    Ferumoxytol  Other (See Comments)   Unknown    Morphine Sulfate Rash, Other (See Comments)   Itches all over, red spots        Medication List  STOP taking these medications    apixaban  5 MG Tabs tablet Commonly known as: Eliquis        TAKE these medications    acetaminophen  325 MG tablet Commonly known as: TYLENOL  Take 1-2 tablets (325-650 mg total) by mouth every 4 (four) hours as needed for mild pain.   amiodarone  200 MG tablet Commonly known as: PACERONE  Take 1 tablet (200 mg total) by mouth 2 (two) times daily for 20 days, THEN 1 tablet (200 mg total) daily. Start taking on: May 02, 2023    aspirin  EC 81 MG tablet Take 1 tablet (81 mg total) by mouth daily. Swallow whole.   atorvastatin  80 MG tablet Commonly known as: LIPITOR  TAKE 1 TABLET BY MOUTH AT BEDTIME What changed:  how much to take how to take this when to take this additional instructions   BD Pen Needle Nano U/F 32G X 4 MM Misc Generic drug: Insulin  Pen Needle USE 1 PEN NEEDLE SUBCUTANEOUSLY WITH INSULIN  4 TIMES DAILY   carvedilol  12.5 MG tablet Commonly known as: COREG  Take 1 tablet (12.5 mg total) by mouth 2 (two) times daily.   cinacalcet  30 MG tablet Commonly known as: SENSIPAR  Take 1 tablet (30 mg total) by mouth Every Tuesday,Thursday,and Saturday with dialysis.   clopidogrel  75 MG tablet Commonly known as: PLAVIX  Take 1 tablet (75 mg total) by mouth daily with breakfast.   FreeStyle Libre 3 Plus Sensor Misc Change sensor every 15 days. Use as directed to check glucose daily   FreeStyle Libre 3 Reader Devi Use to check blood glucose TID   gabapentin  300 MG capsule Commonly known as: NEURONTIN  Take 1 capsule (300 mg total) by mouth at bedtime.   glucose blood test strip Check 1 time daily. E11.9 One Touch Ultra Blue Test Strips   HumaLOG  KwikPen 200 UNIT/ML KwikPen Generic drug: insulin  lispro INJECT A MAXIMUM OF 28 UNITS SUBCUTANEOUSLY TWICE DAILY WITH LUNCH AND SUPPER PER SLIDING SCALE. APPOINTMENT REQUIRED FOR FUTURE REFILLS   Lantus  SoloStar 100 UNIT/ML Solostar Pen Generic drug: insulin  glargine INJECT 12 UNITS SUBCUTANEOUSLY AT BEDTIME What changed: See the new instructions.   midodrine  10 MG tablet Commonly known as: PROAMATINE  Take 1 tablet (10 mg total) by mouth every 8 (eight) hours. What changed:  when to take this additional instructions   multivitamin Tabs tablet Take 1 tablet by mouth once daily   nitroGLYCERIN  0.4 MG SL tablet Commonly known as: NITROSTAT  Place 1 tablet (0.4 mg total) under the tongue every 5 (five) minutes as needed for chest pain.    Olopatadine  HCl 0.2 % Soln Place 1 drop into both eyes daily as needed (for allergies).   pantoprazole  40 MG tablet Commonly known as: PROTONIX  Take 1 tablet (40 mg total) by mouth every 12 (twelve) hours.   sevelamer  800 MG tablet Commonly known as: RENAGEL  Take 1,600 mg by mouth 3 (three) times daily with meals.   vancomycin  125 MG capsule Commonly known as: VANCOCIN  Take 1 capsule (125 mg total) by mouth 4 (four) times daily for 14 days, THEN 1 capsule (125 mg total) 2 (two) times daily for 7 days, THEN 1 capsule (125 mg total) daily for 7 days, THEN 1 capsule (125 mg total) every other day for 28 days, THEN 1 capsule (125 mg total) every 3 (three) days for 28 days. Start taking on: May 02, 2023   Ventolin  HFA 108 (90 Base) MCG/ACT inhaler Generic drug: albuterol  Inhale 1-2 puffs into the lungs every 6 (six) hours as needed  for wheezing or shortness of breath.        Discharge Exam: Filed Weights   04/29/23 1636 05/02/23 0811 05/02/23 1155  Weight: 91.4 kg 87.8 kg 85.3 kg   GENERAL:  Alert, pleasant, no acute distress  HEENT:  EOMI CARDIOVASCULAR:  RRR, no murmurs appreciated RESPIRATORY:  Clear to auscultation, no wheezing, rales, or rhonchi GASTROINTESTINAL:  Soft, nontender, nondistended EXTREMITIES: BL BKA NEURO:  No new focal deficits appreciated SKIN:  No rashes noted PSYCH:  Appropriate mood and affect    Condition at discharge: improving  The results of significant diagnostics from this hospitalization (including imaging, microbiology, ancillary and laboratory) are listed below for reference.   Imaging Studies: DG CHEST PORT 1 VIEW Result Date: 04/30/2023 CLINICAL DATA:  Rapid response for hypotension and shortness of breath. EXAM: PORTABLE CHEST 1 VIEW COMPARISON:  04/10/2023 FINDINGS: Right IJ CVC tip in the right atrium. Cardiomegaly. Left chest wall ICD. Aortic atherosclerotic calcification. Pulmonary vascular congestion. Left lower lung airspace  opacities. Small bilateral pleural effusions. No pneumothorax. No displaced rib fractures. IMPRESSION: 1. Cardiomegaly with pulmonary vascular congestion and small bilateral pleural effusions. 2. Left lower lung airspace opacities, atelectasis versus pneumonia. Electronically Signed   By: Rozell Cornet M.D.   On: 04/30/2023 01:13   CARDIAC CATHETERIZATION Result Date: 04/14/2023 Images from the original result were not included.   LESION #1: Prox RCA to Mid RCA lesion is 80% stenosed.   Scoring balloon angioplasty was performed using a BALLN SCOREFLEX 3.50X15. Followed by shockwave lithotripsy with a 3.5 mm 12 mm shockwave balloon   A drug-eluting stent was successfully placed using a SYNERGY XD 3.50X28 => postdilated in the majority of stent to 3.8-3.9 mm. Post intervention, there is a 5% residual stenosis in the tightest segment, but the remainder of the stent is 0% stenosed.  TIMI-3 flow maintained   --------------------------------------------   LESION #2 Ost RCA lesion is 80% stenosed.   Scoring balloon angioplasty was performed using a BALLN SCOREFLEX 3.50X15.  Followed by shockwave lithotripsy with a 3.5 mm 12 mm shockwave balloon   A drug-eluting stent was successfully placed using a SYNERGY XD 3.50X16 postdilated from 4.2 to 4.0 mm.  Post intervention, there is a 0% residual stenosis.  TIMI-3 flow maintained Diagnostic  Dominance: Right       Intervention Successful 2 site PCI of the RCA using score flex angioplasty followed by shockwave lithotripsy for lesion modification: Mid RCA 80% reduced to 0% with exception of focal 5% (Synergy XD 3.5 mm x 28 mm postdilated to 3.8 mm); ostial RCA 80% reduced to 0% (Synergy XD 3.5 mm 16mm postdilated in tapered fashion from 4.2 to 3.9 mm) RECOMMENDATIONS   In the absence of any other complications or medical issues, we expect the patient to be ready for discharge from an interventional cardiology perspective on 04/15/2023.   Continue to titrate GDMT   Recommend dual  antiplatelet therapy with Aspirin  81mg  daily and Clopidogrel  75mg  daily long-term (beyond 12 months) because of Extensive PCI to both the LAD and RCA.   Would continue DAPT for 1 year and then after 1 year would continue long-term clopidogrel    CARDIAC CATHETERIZATION Result Date: 04/12/2023   Ost RCA lesion is 80% stenosed.   Prox RCA to Mid RCA lesion is 80% stenosed.   Ost LM lesion is 45% stenosed.   Prox LAD to Mid LAD lesion is 85% stenosed.   1st Diag lesion is 95% stenosed.   1st Mrg lesion is 100% stenosed.  Dist Cx-1 lesion is 85% stenosed.   Dist Cx-2 lesion is 100% stenosed.   A drug-eluting stent was successfully placed using a SYNERGY XD 2.50X12 in the side branch.   A drug-eluting stent was successfully placed using a SYNERGY XD 2.50X32 in the main branch.   Angioplasty was performed in the main branch. .   Angioplasty was performed in the side branch. .   Post intervention, there is a 0% residual stenosis.   Post intervention, there is a 0% residual stenosis.   LV end diastolic pressure is normal.   Recommend to resume Apixaban , at currently prescribed dose and frequency.   Recommend concurrent antiplatelet therapy of Aspirin  81 mg for 1 month and Clopidogrel  75mg  daily for 12 months . Severe 3 vessel obstructive CAD. Suspect culprit vessel is the first OM which is occluded Normal LVEDP Successful bifurcation stenting of the LAD/first diagonal with 2.5 x 12 mm Synergy stent in the diagonal and 2.5 x 32 mm Synergy stent in the LAD Plan: DAPT with ASA 81 mg daily for one month. Plavix  75 mg daily for 12 months for ACS. Anticipate resuming Eliquis  once procedures are completed. Recommend return for staged PCI of the RCA at the ostium and mid vessel. Would treat LCx disease medically.   ECHOCARDIOGRAM COMPLETE Result Date: 04/11/2023    ECHOCARDIOGRAM REPORT   Patient Name:   PATE AYLWARD [facility] Date of Exam: 04/11/2023 Medical Rec #:  [id number]     Height:       70.0 in Accession #:    [id number]     Weight:       207.2 lb Date of Birth:  [birth year]     BSA:          2.119 m Patient Age:    62 years      BP:           124/41 mmHg Patient Gender: M             HR:           71 bpm. Exam Location:  [facility] Procedure: 2D Echo, Cardiac Doppler, Color Doppler and Intracardiac            Opacification Agent (Both Spectral and Color Flow Doppler were            utilized during procedure). Indications:    NSTEMI l21.4  History:        Patient has prior history of Echocardiogram examinations, most                 recent 11/29/2022. CHF and Cardiomyopathy, CAD, COPD; Risk                 Factors:Hypertension, Diabetes, Dyslipidemia and Former Smoker.                 ESRD on dailysis.  Sonographer:    Denese Finn RCS Referring Phys: [id number] JAN A MANSY  Sonographer Comments: Suboptimal apical window. IMPRESSIONS  1. Left ventricular ejection fraction, by estimation, is 40%. The left ventricle has mildly decreased function. The left ventricle demonstrates global hypokinesis. There is moderate left ventricular hypertrophy. Left ventricular diastolic parameters are  consistent with Grade I diastolic dysfunction (impaired relaxation). Elevated left atrial pressure.  2. Right ventricular systolic function is normal. The right ventricular size is normal.  3. The mitral valve is normal in structure. No evidence of mitral valve regurgitation. No evidence of mitral stenosis.  4. The aortic valve is tricuspid. Aortic  valve regurgitation is not visualized. No aortic stenosis is present.  5. The inferior vena cava is normal in size with greater than 50% respiratory variability, suggesting right atrial pressure of 3 mmHg. FINDINGS  Left Ventricle: Left ventricular ejection fraction, by estimation, is 40%. The left ventricle has mildly decreased function. The left ventricle demonstrates global hypokinesis. Definity  contrast agent was given IV to delineate the left ventricular endocardial borders. The left ventricular internal  cavity size was normal in size. There is moderate left ventricular hypertrophy. Left ventricular diastolic parameters are consistent with Grade I diastolic dysfunction (impaired relaxation). Elevated left atrial pressure. Right Ventricle: The right ventricular size is normal. Right vetricular wall thickness was not well visualized. Right ventricular systolic function is normal. Left Atrium: Left atrial size was normal in size. Right Atrium: Right atrial size was normal in size. Pericardium: There is no evidence of pericardial effusion. Mitral Valve: The mitral valve is normal in structure. No evidence of mitral valve regurgitation. No evidence of mitral valve stenosis. Tricuspid Valve: The tricuspid valve is normal in structure. Tricuspid valve regurgitation is not demonstrated. No evidence of tricuspid stenosis. Aortic Valve: The aortic valve is tricuspid. Aortic valve regurgitation is not visualized. No aortic stenosis is present. Aortic valve mean gradient measures 2.0 mmHg. Aortic valve peak gradient measures 3.7 mmHg. Aortic valve area, by VTI measures 2.11 cm. Pulmonic Valve: The pulmonic valve was not well visualized. Pulmonic valve regurgitation is not visualized. No evidence of pulmonic stenosis. Aorta: The aortic root is normal in size and structure. Venous: The inferior vena cava is normal in size with greater than 50% respiratory variability, suggesting right atrial pressure of 3 mmHg. IAS/Shunts: No atrial level shunt detected by color flow Doppler.  LEFT VENTRICLE PLAX 2D LVIDd:         5.50 cm   Diastology LVIDs:         4.40 cm   LV e' medial:    2.83 cm/s LV PW:         1.30 cm   LV E/e' medial:  24.4 LV IVS:        1.30 cm   LV e' lateral:   5.33 cm/s LVOT diam:     2.10 cm   LV E/e' lateral: 12.9 LV SV:         55 LV SV Index:   26 LVOT Area:     3.46 cm  RIGHT VENTRICLE RV S prime:     10.70 cm/s TAPSE (M-mode): 2.1 cm LEFT ATRIUM           Index        RIGHT ATRIUM           Index LA diam:       3.80 cm 1.79 cm/m   RA Area:     14.20 cm LA Vol (A2C): 67.0 ml 31.62 ml/m  RA Volume:   35.00 ml  16.52 ml/m LA Vol (A4C): 67.8 ml 32.00 ml/m  AORTIC VALVE AV Area (Vmax):    2.10 cm AV Area (Vmean):   1.83 cm AV Area (VTI):     2.11 cm AV Vmax:           96.78 cm/s AV Vmean:          67.540 cm/s AV VTI:            0.261 m AV Peak Grad:      3.7 mmHg AV Mean Grad:      2.0 mmHg LVOT Vmax:  58.70 cm/s LVOT Vmean:        35.700 cm/s LVOT VTI:          0.159 m LVOT/AV VTI ratio: 0.61  AORTA Ao Root diam: 3.70 cm MITRAL VALVE MV Area (PHT): 4.89 cm     SHUNTS MV Decel Time: 155 msec     Systemic VTI:  0.16 m MV E velocity: 69.00 cm/s   Systemic Diam: 2.10 cm MV A velocity: 103.00 cm/s MV E/A ratio:  0.67 Armida Lander MD Electronically signed by Armida Lander MD Signature Date/Time: 04/11/2023/12:56:21 PM    Final    DG Chest Portable 1 View Result Date: 04/10/2023 CLINICAL DATA:  Short of breath, COPD EXAM: PORTABLE CHEST 1 VIEW COMPARISON:  03/06/2023 FINDINGS: Single frontal view of the chest demonstrates stable right internal jugular dialysis catheter and single lead defibrillator. Cardiac silhouette is stable. No acute airspace disease, effusion, or pneumothorax. No acute bony abnormalities. IMPRESSION: 1. Stable chest, no acute process. Electronically Signed   By: Bobbye Burrow M.D.   On: 04/10/2023 21:52    Microbiology: Results for orders placed or performed during the hospital encounter of 04/27/23  Gastrointestinal Panel by PCR , Stool     Status: None   Collection Time: 04/27/23 10:55 PM   Specimen: STOOL  Result Value Ref Range Status   Campylobacter species NOT DETECTED NOT DETECTED Final   Plesimonas shigelloides NOT DETECTED NOT DETECTED Final   Salmonella species NOT DETECTED NOT DETECTED Final   Yersinia enterocolitica NOT DETECTED NOT DETECTED Final   Vibrio species NOT DETECTED NOT DETECTED Final   Vibrio cholerae NOT DETECTED NOT DETECTED Final    Enteroaggregative E coli (EAEC) NOT DETECTED NOT DETECTED Final   Enteropathogenic E coli (EPEC) NOT DETECTED NOT DETECTED Final   Enterotoxigenic E coli (ETEC) NOT DETECTED NOT DETECTED Final   Shiga like toxin producing E coli (STEC) NOT DETECTED NOT DETECTED Final   Shigella/Enteroinvasive E coli (EIEC) NOT DETECTED NOT DETECTED Final   Cryptosporidium NOT DETECTED NOT DETECTED Final   Cyclospora cayetanensis NOT DETECTED NOT DETECTED Final   Entamoeba histolytica NOT DETECTED NOT DETECTED Final   Giardia lamblia NOT DETECTED NOT DETECTED Final   Adenovirus F40/41 NOT DETECTED NOT DETECTED Final   Astrovirus NOT DETECTED NOT DETECTED Final   Norovirus GI/GII NOT DETECTED NOT DETECTED Final   Rotavirus A NOT DETECTED NOT DETECTED Final   Sapovirus (I, II, IV, and V) NOT DETECTED NOT DETECTED Final    Comment: Performed at [facility], [address] [place], [place] [address]  C Difficile Quick Screen w PCR reflex     Status: Abnormal   Collection Time: 04/27/23 10:55 PM   Specimen: STOOL  Result Value Ref Range Status   C Diff antigen POSITIVE (A) NEGATIVE Final   C Diff toxin POSITIVE (A) NEGATIVE Final   C Diff interpretation Toxin producing C. difficile detected.  Final    Comment: RESULTS CALLED TO READ BACK BY AND VERIFIED WITH RN L.CARDIERI ON 04/28/23 AT 0041 BY NM Performed at [facility], [address] [place], [place] [address]   Culture, blood (Routine X 2) w Reflex to ID Panel     Status: None (Preliminary result)   Collection Time: 04/30/23  1:59 AM   Specimen: BLOOD RIGHT ARM  Result Value Ref Range Status   Specimen Description BLOOD RIGHT ARM  Final   Special Requests   Final    BOTTLES DRAWN AEROBIC AND ANAEROBIC Blood Culture  adequate volume   Culture   Final    NO GROWTH 2 DAYS Performed at [facility], [address], [place], [place] [address]    Report Status PENDING  Incomplete  Culture, blood (Routine X 2) w Reflex to ID Panel      Status: None (Preliminary result)   Collection Time: 04/30/23  1:59 AM   Specimen: BLOOD RIGHT HAND  Result Value Ref Range Status   Specimen Description BLOOD RIGHT HAND  Final   Special Requests   Final    BOTTLES DRAWN AEROBIC AND ANAEROBIC Blood Culture results may not be optimal due to an inadequate volume of blood received in culture bottles   Culture   Final    NO GROWTH 2 DAYS Performed at [facility], [address], [place], [place] [address]    Report Status PENDING  Incomplete  MRSA Next Gen by PCR, Nasal     Status: None   Collection Time: 04/30/23  3:00 AM   Specimen: Nasal Mucosa; Nasal Swab  Result Value Ref Range Status   MRSA by PCR Next Gen NOT DETECTED NOT DETECTED Final    Comment: (NOTE) The GeneXpert MRSA Assay (FDA approved for NASAL specimens only), is one component of a comprehensive MRSA colonization surveillance program. It is not intended to diagnose MRSA infection nor to guide or monitor treatment for MRSA infections. Test performance is not FDA approved in patients less than 50 years old. Performed at [facility], [address], [place], [place] [address]    *Note: Due to a large number of results and/or encounters for the requested time period, some results have not been displayed. A complete set of results can be found in Results Review.    Labs: CBC: Recent Labs  Lab 04/29/23 1333 04/30/23 0127 05/01/23 0509 05/02/23 0610 05/02/23 0913  WBC 17.7* 17.4* 14.3* 13.8* 12.3*  NEUTROABS 16.2* 14.4* 11.2* 9.8*  --   HGB 8.4* 8.4* 9.0* 10.1* 9.1*  HCT 25.0* 25.2* 26.6* 30.7* 28.1*  MCV 89.6 90.0 91.1 92.5 93.7  PLT 185 175 231 266 245   Basic Metabolic Panel: Recent Labs  Lab 04/29/23 0437 04/30/23 0131 04/30/23 0440 04/30/23 0936 05/01/23 0509 05/02/23 0610 05/02/23 0913  NA 132*   < > 121* 133* 134* 134* 137  K 4.0   < > 3.5 3.9 3.7 3.8 3.9  CL 95*   < > 86* 93* 96* 99 98  CO2 20*   < > 20* 23 23 20* 22   GLUCOSE 252*   < > 561* 234* 178* 67* 127*  BUN 73*   < > 38* 45* 56* 64* 62*  CREATININE 7.41*   < > 5.09* 5.93* 7.17* 8.92* 8.88*  CALCIUM  8.3*   < > 7.3* 8.3* 7.9* 8.4* 8.3*  PHOS 5.1*  --  3.9  --  4.7* 4.8* 5.0*   < > = values in this interval not displayed.   Liver Function Tests: Recent Labs  Lab 04/27/23 0404 04/28/23 9147 04/30/23 0440 04/30/23 0936 05/01/23 0509 05/02/23 0610 05/02/23 0913  AST 28  --   --  47*  --   --   --   ALT 22  --   --  25  --   --   --   ALKPHOS 62  --   --  71  --   --   --   BILITOT 0.4  --   --  0.8  --   --   --  PROT 5.9*  --   --  5.9*  --   --   --   ALBUMIN  2.6*   < > 2.0* 2.2* 2.1* 2.2* 2.1*   < > = values in this interval not displayed.   CBG: Recent Labs  Lab 05/01/23 1106 05/01/23 1708 05/01/23 2122 05/02/23 0802 05/02/23 1138  GLUCAP 286* 137* 129* 90 96    Discharge time spent: greater than 30 minutes.  Signed: Jodeane Mulligan, DO [facility] 05/02/2023

## 2023-05-02 NOTE — Progress Notes (Addendum)
   Patient Name: Anthony Bullock Date of Encounter: 05/02/2023 [facility] Cardiologist: Armida Lander, MD   Interval Summary  .    Reports episode of BRB last evening with BM, no abd pain or chest pain.   Vital Signs .    Vitals:   05/02/23 0400 05/02/23 0600 05/02/23 0806 05/02/23 0811  BP: 125/60   (!) 119/48  Pulse: 79 84  81  Resp: 18 20  15   Temp:   97.6 F (36.4 C)   TempSrc: Oral  Oral   SpO2: 100% 100%  100%  Weight:    87.8 kg  Height:        Intake/Output Summary (Last 24 hours) at 05/02/2023 0941 Last data filed at 05/02/2023 0800 Gross per 24 hour  Intake 715.82 ml  Output --  Net 715.82 ml      05/02/2023    8:11 AM 04/29/2023    4:36 PM 04/29/2023    1:11 PM  Last 3 Weights  Weight (lbs) 193 lb 9 oz 201 lb 8 oz 204 lb 9.4 oz  Weight (kg) 87.8 kg 91.4 kg 92.8 kg      Telemetry/ECG    Sinus Rhythm - Personally Reviewed  Physical Exam .    GEN: No acute distress.   Neck: No JVD Cardiac: RRR, no murmurs, rubs, or gallops.  Respiratory: Clear to auscultation bilaterally. GI: Soft, nontender, non-distended  MS: No edema  Assessment & Plan .     63 y.o. male with chronic HFrEF/NICM s/p Bos Sci S-ICD 06/2013 subsequently left in place after battery ran out, CAD (2015 diffuse disease managed medically, recent NSTEMI 04/2023 s/p 3v CAD s/p bifurcation stenting of LAD/D1 04/12/23 then staged 2-site PCI to RCA 04/14/23), ESRD on HD, HTN also with issues with hypotension, HLD, DM2, mild carotid disease (1-39% BICA 12/2022), LBBB, moderate MR by echo 08/2022 not seen on 04/2023 echo, osteomyelitis, bilateral BKA, prior gangrenous wound, anemia, arthritis, kidney stones. Recently admitted 3/31-4/5 with NSTEMI with PCI as above, EF 40%, went home on triple therapy. Returned to the hospital this admission with n/v, diarrhea, dark stools, coffee ground emesis. Found to have Hgb 5.6, transfused. Also c. Diff positive started on abx. Underwent EGD with superficial  duodenal ulcers, no high risk lesions. Transferred to ICU 4/20 due to AF RVR.   Acute blood loss anemia 2/2 UGIB  -- s/p EGD this admission w/superficial duodenal ulcers, no high risk lesions. Has been continued on plavix , Eliquis  remains on hold with IV heparin  for now with plans to transition back to Eliquis  -- reports episode of BRB with BM last evening -- Hgb 9.0>>10.1>>9.1, with recurrent bleeding, would hold an additional day before resuming eliquis   CAD w/ recent NSTEMI -- cath 04/14/2023 with multivessel PCI of bifurcation of the LAD and RCA x2 -- remains on plavix , ASA held -- no chest pain  Paroxysmal atrial fibrillation -- episode on 4/19, received IV amio now transitioned to PO (taper ordered) -- no further episodes -- may consider for watchman referral in the future  Chronic HFrEF -- GDMT limited with hypotension -- volume management per HD  C diff -- on antibiotics per primary  ESRD on HD -- per nephrology   For questions or updates, please contact [facility] Please consult [email or web address] for contact info under        Signed, Johnie Nailer, NP

## 2023-05-02 NOTE — Progress Notes (Signed)
 Received patient in bed. Alert and oriented x 4.Consent verified.   Access used : Right hd catheter that worked well.Dressing on date.  Duration of treatment: 3 hours.  Uf goal : Met 2.5 L . He tolerated hd treatment.  Hand off to the patient's nurse with stable medical condition.

## 2023-05-02 NOTE — Plan of Care (Signed)
  Problem: Coping: Goal: Ability to adjust to condition or change in health will improve Outcome: Progressing   Problem: Fluid Volume: Goal: Ability to maintain a balanced intake and output will improve Outcome: Progressing   Problem: Health Behavior/Discharge Planning: Goal: Ability to identify and utilize available resources and services will improve Outcome: Progressing Goal: Ability to manage health-related needs will improve Outcome: Progressing   Problem: Metabolic: Goal: Ability to maintain appropriate glucose levels will improve Outcome: Progressing   Problem: Nutritional: Goal: Maintenance of adequate nutrition will improve Outcome: Progressing Goal: Progress toward achieving an optimal weight will improve Outcome: Progressing   Problem: Education: Goal: Knowledge of General Education information will improve Description: Including pain rating scale, medication(s)/side effects and non-pharmacologic comfort measures Outcome: Progressing   Problem: Health Behavior/Discharge Planning: Goal: Ability to manage health-related needs will improve Outcome: Progressing   Problem: Clinical Measurements: Goal: Ability to maintain clinical measurements within normal limits will improve Outcome: Progressing Goal: Will remain free from infection Outcome: Progressing Goal: Diagnostic test results will improve Outcome: Progressing Goal: Respiratory complications will improve Outcome: Progressing Goal: Cardiovascular complication will be avoided Outcome: Progressing   Problem: Activity: Goal: Risk for activity intolerance will decrease Outcome: Progressing   Problem: Nutrition: Goal: Adequate nutrition will be maintained Outcome: Progressing   Problem: Coping: Goal: Level of anxiety will decrease Outcome: Progressing   Problem: Elimination: Goal: Will not experience complications related to bowel motility Outcome: Progressing Goal: Will not experience complications  related to urinary retention Outcome: Progressing   Problem: Pain Managment: Goal: General experience of comfort will improve and/or be controlled Outcome: Progressing   Problem: Safety: Goal: Ability to remain free from injury will improve Outcome: Progressing   Problem: Skin Integrity: Goal: Risk for impaired skin integrity will decrease Outcome: Progressing

## 2023-05-02 NOTE — Progress Notes (Signed)
 PT Cancellation Note  Patient Details Name: Anthony Bullock MRN: [id number] DOB: November 05, 1960   Cancelled Treatment:    Reason Eval/Treat Not Completed: (P) Patient at procedure or test/unavailable (pt having HD in ICU room when attempted.)   JEFFORY SNELGROVE 05/02/2023, 1:11 PM

## 2023-05-03 ENCOUNTER — Encounter: Payer: Self-pay | Admitting: Pulmonary Disease

## 2023-05-03 ENCOUNTER — Ambulatory Visit: Payer: Medicare Other | Admitting: Pulmonary Disease

## 2023-05-03 ENCOUNTER — Other Ambulatory Visit (HOSPITAL_COMMUNITY): Payer: Self-pay

## 2023-05-03 ENCOUNTER — Telehealth: Payer: Self-pay

## 2023-05-03 VITALS — BP 134/78 | HR 87 | Ht 70.0 in | Wt 205.5 lb

## 2023-05-03 DIAGNOSIS — Z992 Dependence on renal dialysis: Secondary | ICD-10-CM | POA: Diagnosis not present

## 2023-05-03 DIAGNOSIS — Z87891 Personal history of nicotine dependence: Secondary | ICD-10-CM

## 2023-05-03 DIAGNOSIS — G4733 Obstructive sleep apnea (adult) (pediatric): Secondary | ICD-10-CM | POA: Diagnosis not present

## 2023-05-03 DIAGNOSIS — N186 End stage renal disease: Secondary | ICD-10-CM

## 2023-05-03 DIAGNOSIS — G2581 Restless legs syndrome: Secondary | ICD-10-CM

## 2023-05-03 DIAGNOSIS — I2510 Atherosclerotic heart disease of native coronary artery without angina pectoris: Secondary | ICD-10-CM

## 2023-05-03 NOTE — Transitions of Care (Post Inpatient/ED Visit) (Signed)
   05/03/2023  Name: JATIN NAUMANN MRN: [id number] DOB: February 18, 1960  Today's TOC FU Call Status:    Attempted to reach the patient regarding the most recent Inpatient/ED visit.  Follow Up Plan: Additional outreach attempts will be made to reach the patient to complete the Transitions of Care (Post Inpatient/ED visit) call.   Orpha Blade, RN, BSN, CEN [employer]- Transition of Care Team.  [facility] [phone]

## 2023-05-03 NOTE — Progress Notes (Signed)
 Anthony Bullock    [id number]    1960-01-27  Primary Care Physician:Burchette, Marijean Shouts, MD  Referring Physician: Marquetta Sit, MD [address] [place],  [place] [address]  Chief complaint:   Patient being seen for snoring, witnessed apneas History of restless legs  HPI:  Patient with sleep disordered breathing Has been using CPAP  Patient cuts out by itself sometimes  Not having any difficulty with tolerating the machine but notices that sometimes he wakes up and the machine is not working This does not happen all the time but it is concerning  Recently in the hospital for a GI bleed Recently had 4 stents placed for coronary artery disease  His sleep is fair  Activity level has been relatively well outside being hospitalized recently  Had a sleep study done showing severe obstructive sleep apnea -Not having issues with the CPAP  Breathing has been relatively stable  Reformed smoker  Admits to dryness of his mouth in the morning No morning headaches Recently diagnosed with A-fib in the last couple of months He has a history of hypertension, coronary artery disease, heart failure, diabetes, hypercholesterolemia, blood clots  Has been on dialysis for about 7 years  Quit smoking about 13 years ago   Outpatient Encounter Medications as of 05/03/2023  Medication Sig   acetaminophen  (TYLENOL ) 325 MG tablet Take 1-2 tablets (325-650 mg total) by mouth every 4 (four) hours as needed for mild pain.   amiodarone  (PACERONE ) 200 MG tablet Take 1 tablet (200 mg total) by mouth 2 (two) times daily for 20 days, THEN 1 tablet (200 mg total) daily.   atorvastatin  (LIPITOR ) 80 MG tablet TAKE 1 TABLET BY MOUTH AT BEDTIME (Patient taking differently: Take 80 mg by mouth daily.)   carvedilol  (COREG ) 12.5 MG tablet Take 1 tablet (12.5 mg total) by mouth 2 (two) times daily.   cinacalcet  (SENSIPAR ) 30 MG tablet Take 1 tablet (30 mg total) by mouth Every  Tuesday,Thursday,and Saturday with dialysis.   clopidogrel  (PLAVIX ) 75 MG tablet Take 1 tablet (75 mg total) by mouth daily with breakfast.   Continuous Glucose Receiver (FREESTYLE LIBRE 3 READER) DEVI Use to check blood glucose TID   Continuous Glucose Sensor (FREESTYLE LIBRE 3 PLUS SENSOR) MISC Change sensor every 15 days. Use as directed to check glucose daily   gabapentin  (NEURONTIN ) 300 MG capsule Take 1 capsule (300 mg total) by mouth at bedtime.   glucose blood test strip Check 1 time daily. E11.9 One Touch Ultra Blue Test Strips   HUMALOG  KWIKPEN 200 UNIT/ML KwikPen INJECT A MAXIMUM OF 28 UNITS SUBCUTANEOUSLY TWICE DAILY WITH LUNCH AND SUPPER PER SLIDING SCALE. APPOINTMENT REQUIRED FOR FUTURE REFILLS   Insulin  Pen Needle (BD PEN NEEDLE NANO U/F) 32G X 4 MM MISC USE 1 PEN NEEDLE SUBCUTANEOUSLY WITH INSULIN  4 TIMES DAILY   LANTUS  SOLOSTAR 100 UNIT/ML Solostar Pen INJECT 12 UNITS SUBCUTANEOUSLY AT BEDTIME (Patient taking differently: Inject 14 Units into the skin at bedtime.)   midodrine  (PROAMATINE ) 10 MG tablet Take 1 tablet (10 mg total) by mouth every 8 (eight) hours.   multivitamin (RENA-VIT) TABS tablet Take 1 tablet by mouth once daily (Patient taking differently: Take 1 tablet by mouth daily.)   nitroGLYCERIN  (NITROSTAT ) 0.4 MG SL tablet Place 1 tablet (0.4 mg total) under the tongue every 5 (five) minutes as needed for chest pain.   Olopatadine  HCl 0.2 % SOLN Place 1 drop into both eyes daily as  needed (for allergies).    pantoprazole  (PROTONIX ) 40 MG tablet Take 1 tablet (40 mg total) by mouth every 12 (twelve) hours.   sevelamer  (RENAGEL ) 800 MG tablet Take 1,600 mg by mouth 3 (three) times daily with meals.   vancomycin  (VANCOCIN ) 125 MG capsule Take 1 capsule (125 mg total) by mouth 4 (four) times daily for 14 days, THEN 1 capsule (125 mg total) 2 (two) times daily for 7 days, THEN 1 capsule (125 mg total) daily for 7 days, THEN 1 capsule (125 mg total) every other day for 28 days,  THEN 1 capsule (125 mg total) every 3 (three) days for 28 days.   VENTOLIN  HFA 108 (90 Base) MCG/ACT inhaler Inhale 1-2 puffs into the lungs every 6 (six) hours as needed for wheezing or shortness of breath.   aspirin  EC 81 MG tablet Take 1 tablet (81 mg total) by mouth daily. Swallow whole. (Patient not taking: Reported on 05/03/2023)   No facility-administered encounter medications on file as of 05/03/2023.    Allergies as of 05/03/2023 - Review Complete 05/03/2023  Allergen Reaction Noted   Epoetin  alfa Other (See Comments) 07/09/2019   Ferumoxytol  Other (See Comments) 07/09/2019   Morphine sulfate Rash and Other (See Comments) 05/07/2008    Past Medical History:  Diagnosis Date   AICD (automatic cardioverter/defibrillator) present    [employer]   Allergic rhinitis    Anemia    Arthritis    Chronic systolic heart failure (HCC)    a. ECHO (12/2012) EF 25-30%, HK entireanteroseptal myocardium //  b.  EF 25%, diffuse HK, grade 1 diastolic dysfunction, MAC, mild LAE, normal RVSF, trivial pericardial effusion   COPD (chronic obstructive pulmonary disease) (HCC)    Diabetes mellitus type II    Diabetic nephropathy (HCC)    Diabetic neuropathy (HCC)    ESRD on hemodialysis (HCC)    started HD June 2017, goes to [facility] HD unit, Dr Lieutenant Reese   History of cardiac catheterization    a.Myoview  1/15:  There is significant left ventricular dysfunction. There may be slight scar at the apex. There is no significant ischemia. LV Ejection Fraction: 27%  //  b. RHC/LHC (1/15) with mean RA 6, PA 47/22 mean 33, mean PCWP 20, PVR 2.5 WU, CI 2.5; 80% dLAD stenosis, 70% diffuse large D.     History of kidney stones    Hyperlipidemia    Hypertension    Kidney stones    NICM (nonischemic cardiomyopathy) (HCC)    Primarily nonischemic.  Echo (12/14) with EF 25-30%.  Echo (3/15) with EF 25%, mild to moderately dilated LV, normal RV size and systolic function.     Osteomyelitis (HCC)    left  fifth ray   Pneumonia    Urethral stricture    Wears glasses     Past Surgical History:  Procedure Laterality Date   ABDOMINAL AORTOGRAM W/LOWER EXTREMITY N/A 03/30/2016   Procedure: Abdominal Aortogram w/Lower Extremity;  Surgeon: Dannis Dy, MD;  Location: [facility] INVASIVE CV LAB;  Service: Cardiovascular;  Laterality: N/A;   AMPUTATION Right 04/26/2016   Procedure: Right Below Knee Amputation;  Surgeon: Timothy Ford, MD;  Location: [facility] OR;  Service: Orthopedics;  Laterality: Right;   AMPUTATION Left 08/21/2019   Procedure: LEFT FOOT 5TH RAY AMPUTATION;  Surgeon: Timothy Ford, MD;  Location: [facility] OR;  Service: Orthopedics;  Laterality: Left;   AMPUTATION Left 11/13/2019   Procedure: LEFT BELOW KNEE AMPUTATION;  Surgeon: Timothy Ford, MD;  Location: [facility] OR;  Service: Orthopedics;  Laterality: Left;   AV FISTULA PLACEMENT Right 09/08/2015   Procedure: INSERTION OF 4-75mm x 45cm  ARTERIOVENOUS (AV) GORE-TEX GRAFT RIGHT UPPER  ARM;  Surgeon: Dannis Dy, MD;  Location: [facility] OR;  Service: Vascular;  Laterality: Right;   AV FISTULA PLACEMENT Left 01/14/2016   Procedure: CREATION OF LEFT UPPER ARM ARTERIOVENOUS FISTULA;  Surgeon: Dannis Dy, MD;  Location: [facility] OR;  Service: Vascular;  Laterality: Left;   BASCILIC VEIN TRANSPOSITION Right 08/22/2014   Procedure: RIGHT UPPER ARM BASCILIC VEIN TRANSPOSITION;  Surgeon: Dannis Dy, MD;  Location: [facility] OR;  Service: Vascular;  Laterality: Right;   BELOW KNEE LEG AMPUTATION Right 04/26/2016   BIOPSY OF SKIN SUBCUTANEOUS TISSUE AND/OR MUCOUS MEMBRANE  04/28/2023   Procedure: BIOPSY, SKIN, SUBCUTANEOUS TISSUE, OR MUCOUS MEMBRANE;  Surgeon: Sergio Dandy, MD;  Location: [facility] ENDOSCOPY;  Service: Gastroenterology;;   CARDIAC CATHETERIZATION     CARDIAC DEFIBRILLATOR PLACEMENT  06/27/2013   Sub Q       BY DR Rodolfo Clan   CATARACT EXTRACTION W/PHACO Right 08/06/2018   Procedure: CATARACT EXTRACTION PHACO AND INTRAOCULAR LENS PLACEMENT  (IOC);  Surgeon: Tarri Farm, MD;  Location: [facility] ORS;  Service: Ophthalmology;  Laterality: Right;  CDE: 4.06   CATARACT EXTRACTION W/PHACO Left 08/20/2018   Procedure: CATARACT EXTRACTION PHACO AND INTRAOCULAR LENS PLACEMENT (IOC);  Surgeon: Tarri Farm, MD;  Location: [facility] ORS;  Service: Ophthalmology;  Laterality: Left;  CDE: 6.76   COLONOSCOPY WITH PROPOFOL  N/A 07/22/2015   Procedure: COLONOSCOPY WITH PROPOFOL ;  Surgeon: Albertina Hugger, MD;  Location: [facility] ENDOSCOPY;  Service: Gastroenterology;  Laterality: N/A;   CORONARY LITHOTRIPSY N/A 04/14/2023   Procedure: CORONARY LITHOTRIPSY;  Surgeon: Arleen Lacer, MD;  Location: [facility] INVASIVE CV LAB;  Service: Cardiovascular;  Laterality: N/A;  RCA   CORONARY STENT INTERVENTION N/A 04/12/2023   Procedure: CORONARY STENT INTERVENTION;  Surgeon: Swaziland, Peter M, MD;  Location: [facility] INVASIVE CV LAB;  Service: Cardiovascular;  Laterality: N/A;   CORONARY STENT INTERVENTION N/A 04/14/2023   Procedure: CORONARY STENT INTERVENTION;  Surgeon: Arleen Lacer, MD;  Location: [facility] INVASIVE CV LAB;  Service: Cardiovascular;  Laterality: N/A;   ESOPHAGOGASTRODUODENOSCOPY N/A 04/28/2023   Procedure: EGD (ESOPHAGOGASTRODUODENOSCOPY);  Surgeon: Nandigam, Kavitha V, MD;  Location: [facility] ENDOSCOPY;  Service: Gastroenterology;  Laterality: N/A;   FEMORAL-POPLITEAL BYPASS GRAFT Right 03/31/2016   Procedure: BYPASS GRAFT FEMORAL-POPLITEAL ARTERY USING RIGHT GREATER SAPHENOUS NONREVERSED VEIN;  Surgeon: Dannis Dy, MD;  Location: [facility] OR;  Service: Vascular;  Laterality: Right;   HERNIA REPAIR     I & D EXTREMITY Right 03/31/2016   Procedure: IRRIGATION AND DEBRIDEMENT FOOT;  Surgeon: Dannis Dy, MD;  Location: [facility] OR;  Service: Vascular;  Laterality: Right;   IMPLANTABLE CARDIOVERTER DEFIBRILLATOR IMPLANT N/A 06/27/2013   Procedure: SUB Q ICD;  Surgeon: Verona Goodwill, MD;  Location: [facility] CATH LAB;  Service: Cardiovascular;  Laterality: N/A;   INTRAOPERATIVE  ARTERIOGRAM Right 03/31/2016   Procedure: INTRA OPERATIVE ARTERIOGRAM;  Surgeon: Dannis Dy, MD;  Location: [facility] OR;  Service: Vascular;  Laterality: Right;   IR GENERIC HISTORICAL Right 11/30/2015   IR THROMBECTOMY AV FISTULA W/THROMBOLYSIS/PTA INC/SHUNT/IMG RIGHT 11/30/2015 Erica Hau, MD [facility]   IR GENERIC HISTORICAL  11/30/2015   IR US  GUIDE VASC ACCESS RIGHT 11/30/2015 Erica Hau, MD [facility]   IR GENERIC HISTORICAL Right 12/15/2015   IR THROMBECTOMY AV FISTULA W/THROMBOLYSIS/PTA/STENT INC/SHUNT/IMG RT 12/15/2015 Marland Silvas,  MD [facility]   IR GENERIC HISTORICAL  12/15/2015   IR US  GUIDE VASC ACCESS RIGHT 12/15/2015 Marland Silvas, MD [facility]   IR GENERIC HISTORICAL  12/28/2015   IR FLUORO GUIDE CV LINE RIGHT 12/28/2015 Artice Last, MD [facility]   IR GENERIC HISTORICAL  12/28/2015   IR US  GUIDE VASC ACCESS RIGHT 12/28/2015 Artice Last, MD [facility]   LEFT A ND RIGHT HEART CATH  01/30/2013   DR Dasie Epps   LEFT AND RIGHT HEART CATHETERIZATION WITH CORONARY ANGIOGRAM N/A 01/30/2013   Procedure: LEFT AND RIGHT HEART CATHETERIZATION WITH CORONARY ANGIOGRAM;  Surgeon: Mardell Shade, MD;  Location: [facility] CATH LAB;  Service: Cardiovascular;  Laterality: N/A;   LEFT HEART CATH AND CORONARY ANGIOGRAPHY N/A 04/12/2023   Procedure: LEFT HEART CATH AND CORONARY ANGIOGRAPHY;  Surgeon: Swaziland, Peter M, MD;  Location: [facility] INVASIVE CV LAB;  Service: Cardiovascular;  Laterality: N/A;   PERIPHERAL VASCULAR CATHETERIZATION Right 01/26/2015   Procedure: A/V Fistulagram;  Surgeon: Dannis Dy, MD;  Location: [facility] INVASIVE CV LAB;  Service: Cardiovascular;  Laterality: Right;   reapea urethral surgery for recurrent obstruction  2011   TOTAL KNEE ARTHROPLASTY Right 2007   VEIN HARVEST Right 03/31/2016   Procedure: RIGHT GREATER SAPHENOUS VEIN HARVEST;  Surgeon: Dannis Dy, MD;  Location: [facility] OR;  Service: Vascular;  Laterality: Right;    Family  History  Problem Relation Age of Onset   Bladder Cancer Mother    Alcohol  abuse Father    Melanoma Father    Stroke Maternal Grandmother    Heart Problems Maternal Grandmother        unknown   Diabetes Maternal Grandmother    Heart disease Maternal Grandfather    Prostate cancer Maternal Grandfather     Social History   Socioeconomic History   Marital status: Married    Spouse name: Not on file   Number of children: 0   Years of education: Not on file   Highest education level: Not on file  Occupational History   Not on file  Tobacco Use   Smoking status: Former    Current packs/day: 0.00    Average packs/day: 2.0 packs/day for 32.0 years (64.0 ttl pk-yrs)    Types: Cigarettes    Start date: 05/11/1978    Quit date: 05/11/2010    Years since quitting: 12.9   Smokeless tobacco: Never  Vaping Use   Vaping status: Never Used  Substance and Sexual Activity   Alcohol  use: No   Drug use: No   Sexual activity: Yes  Other Topics Concern   Not on file  Social History Narrative   Works at [employer] as a Therapist, music      Right handed   Wears glasses    Drinks 10 oz coffee daily   Drinks 2 sodas per day      Pt disabled    Lives with wife    Social Drivers of Corporate investment banker Strain: Low Risk  (02/03/2023)   Overall Financial Resource Strain (CARDIA)    Difficulty of Paying Living Expenses: Not hard at all  Food Insecurity: No Food Insecurity (04/27/2023)   Hunger Vital Sign    Worried About Running Out of Food in the Last Year: Never true    Ran Out of Food in the Last Year: Never true  Transportation Needs: No Transportation Needs (04/27/2023)   PRAPARE - Transportation    Lack of Transportation (Medical): No    Lack  of Transportation (Non-Medical): No  Physical Activity: Inactive (02/03/2023)   Exercise Vital Sign    Days of Exercise per Week: 0 days    Minutes of Exercise per Session: 0 min  Stress: No Stress Concern Present (02/03/2023)   [employer]  of Occupational Health - Occupational Stress Questionnaire    Feeling of Stress : Not at all  Social Connections: Socially Integrated (04/27/2023)   Social Connection and Isolation Panel [NHANES]    Frequency of Communication with Friends and Family: More than three times a week    Frequency of Social Gatherings with Friends and Family: More than three times a week    Attends Religious Services: More than 4 times per year    Active Member of [employer] or Organizations: Yes    Attends Banker Meetings: More than 4 times per year    Marital Status: Married  Catering manager Violence: Not At Risk (04/27/2023)   Humiliation, Afraid, Rape, and Kick questionnaire    Fear of Current or Ex-Partner: No    Emotionally Abused: No    Physically Abused: No    Sexually Abused: No    Review of Systems  Constitutional:  Negative for fever.  Respiratory:  Positive for apnea and shortness of breath.   Psychiatric/Behavioral:  Positive for sleep disturbance.     Vitals:   05/03/23 1338  BP: 134/78  Pulse: 87  SpO2: 97%     Physical Exam Constitutional:      Appearance: Normal appearance.  HENT:     Head: Normocephalic.     Mouth/Throat:     Mouth: Mucous membranes are moist.  Eyes:     General: No scleral icterus. Cardiovascular:     Rate and Rhythm: Normal rate and regular rhythm.     Heart sounds: No murmur heard. Pulmonary:     Effort: No respiratory distress.     Breath sounds: No stridor. No wheezing or rhonchi.  Musculoskeletal:     Cervical back: No rigidity or tenderness.  Neurological:     Mental Status: He is alert.  Psychiatric:        Mood and Affect: Mood normal.       10/19/2022    1:00 PM  Results of the Epworth flowsheet  Sitting and reading 3  Watching TV 3  Sitting, inactive in a public place (e.g. a theatre or a meeting) 2  As a passenger in a car for an hour without a break 2  Lying down to rest in the afternoon when circumstances permit 2   Sitting and talking to someone 0  Sitting quietly after a lunch without alcohol  2  In a car, while stopped for a few minutes in traffic 0  Total score 14     Data Reviewed: No previous sleep studies available  No recent PFTs  Sleep study with severe obstructive sleep apnea  Compliance data show 67%, average use of 3 hours 46 minutes AutoSet 5-20 residual AHI of 11.6 Mild Mask leak  Assessment:  Severe obstructive sleep apnea Continue using CPAP - Will reach out to the DME company to have the machine evaluated to make sure it is working well  Restless legs - Will continue gabapentin   End-stage renal disease on dialysis  Coronary artery disease recent stent placement  Recent GI bleed - Stable at present   Plan/Recommendations: Important to have the machine evaluated to make sure it is working well - We will contact [employer] [facility]  Continue other lines of  care  Tentative follow-up in about 3 months  Encouraged to give us  a call with any significant concerns  Continue gabapentin   Myer Artis MD [facility] 05/03/2023, 1:42 PM  CC: Marquetta Sit, MD

## 2023-05-03 NOTE — Patient Instructions (Signed)
 We will contact [employer]  Make sure you contact [employer] as well  - To evaluate the machine - The machine should not be coming out by itself  Continue using it as tolerated  I will see you back in about 3 months  Continue gabapentin 

## 2023-05-03 NOTE — Telephone Encounter (Signed)
 Called mitch from [employer] to follow up on cpap due to it turning off at night

## 2023-05-04 ENCOUNTER — Telehealth: Payer: Self-pay

## 2023-05-04 DIAGNOSIS — N186 End stage renal disease: Secondary | ICD-10-CM | POA: Diagnosis not present

## 2023-05-04 DIAGNOSIS — N250 Renal osteodystrophy: Secondary | ICD-10-CM | POA: Diagnosis not present

## 2023-05-04 DIAGNOSIS — D509 Iron deficiency anemia, unspecified: Secondary | ICD-10-CM | POA: Diagnosis not present

## 2023-05-04 DIAGNOSIS — N2581 Secondary hyperparathyroidism of renal origin: Secondary | ICD-10-CM | POA: Diagnosis not present

## 2023-05-04 DIAGNOSIS — Z992 Dependence on renal dialysis: Secondary | ICD-10-CM | POA: Diagnosis not present

## 2023-05-04 DIAGNOSIS — D631 Anemia in chronic kidney disease: Secondary | ICD-10-CM | POA: Diagnosis not present

## 2023-05-04 NOTE — Transitions of Care (Post Inpatient/ED Visit) (Signed)
   05/04/2023  Name: Anthony Bullock MRN: [id number] DOB: August 16, 1960  Today's TOC FU Call Status: Today's TOC FU Call Status:: Unsuccessful Call (2nd Attempt) TOC FU Call Complete Date: 05/04/23  Attempted to reach the patient regarding the most recent Inpatient/ED visit.  Follow Up Plan: Additional outreach attempts will be made to reach the patient to complete the Transitions of Care (Post Inpatient/ED visit) call.   Orpha Blade, RN, BSN, CEN [employer]- Transition of Care Team.  [facility] [phone]

## 2023-05-04 NOTE — Progress Notes (Signed)
 Late Note Entry- May 04, 2023  Pt was d/c on Tuesday. Contacted [facility] this morning to advise clinic of pt's d/c date and that pt should resume today. Clinic states pt at treatment and clinic able to obtain d/c summary due to clinic's access to [employer].   Lauraine Polite Renal Navigator [phone]

## 2023-05-05 ENCOUNTER — Telehealth: Payer: Self-pay

## 2023-05-05 ENCOUNTER — Other Ambulatory Visit: Payer: Self-pay | Admitting: Family Medicine

## 2023-05-05 LAB — CULTURE, BLOOD (ROUTINE X 2)
Culture: NO GROWTH
Culture: NO GROWTH
Special Requests: ADEQUATE

## 2023-05-05 NOTE — Transitions of Care (Post Inpatient/ED Visit) (Signed)
   05/05/2023  Name: Anthony Bullock MRN: [id number] DOB: Jun 21, 1960  Today's TOC FU Call Status: Today's TOC FU Call Status:: Unsuccessful Call (1st Attempt) Unsuccessful Call (3rd Attempt) Date: 05/05/23  Attempted to reach the patient regarding the most recent Inpatient/ED visit.  Follow Up Plan: No further outreach attempts will be made at this time. We have been unable to contact the patient.  Orpha Blade, RN, BSN, CEN [employer]- Transition of Care Team.  [facility] [phone]

## 2023-05-05 NOTE — Patient Instructions (Signed)
 Anthony Bullock

## 2023-05-06 DIAGNOSIS — N2581 Secondary hyperparathyroidism of renal origin: Secondary | ICD-10-CM | POA: Diagnosis not present

## 2023-05-06 DIAGNOSIS — D509 Iron deficiency anemia, unspecified: Secondary | ICD-10-CM | POA: Diagnosis not present

## 2023-05-06 DIAGNOSIS — D631 Anemia in chronic kidney disease: Secondary | ICD-10-CM | POA: Diagnosis not present

## 2023-05-06 DIAGNOSIS — N250 Renal osteodystrophy: Secondary | ICD-10-CM | POA: Diagnosis not present

## 2023-05-06 DIAGNOSIS — N186 End stage renal disease: Secondary | ICD-10-CM | POA: Diagnosis not present

## 2023-05-06 DIAGNOSIS — Z992 Dependence on renal dialysis: Secondary | ICD-10-CM | POA: Diagnosis not present

## 2023-05-09 DIAGNOSIS — N186 End stage renal disease: Secondary | ICD-10-CM | POA: Diagnosis not present

## 2023-05-09 DIAGNOSIS — N2581 Secondary hyperparathyroidism of renal origin: Secondary | ICD-10-CM | POA: Diagnosis not present

## 2023-05-09 DIAGNOSIS — D631 Anemia in chronic kidney disease: Secondary | ICD-10-CM | POA: Diagnosis not present

## 2023-05-09 DIAGNOSIS — N250 Renal osteodystrophy: Secondary | ICD-10-CM | POA: Diagnosis not present

## 2023-05-09 DIAGNOSIS — D509 Iron deficiency anemia, unspecified: Secondary | ICD-10-CM | POA: Diagnosis not present

## 2023-05-09 DIAGNOSIS — Z992 Dependence on renal dialysis: Secondary | ICD-10-CM | POA: Diagnosis not present

## 2023-05-10 DIAGNOSIS — N186 End stage renal disease: Secondary | ICD-10-CM | POA: Diagnosis not present

## 2023-05-10 DIAGNOSIS — Z992 Dependence on renal dialysis: Secondary | ICD-10-CM | POA: Diagnosis not present

## 2023-05-11 DIAGNOSIS — D509 Iron deficiency anemia, unspecified: Secondary | ICD-10-CM | POA: Diagnosis not present

## 2023-05-11 DIAGNOSIS — D631 Anemia in chronic kidney disease: Secondary | ICD-10-CM | POA: Diagnosis not present

## 2023-05-11 DIAGNOSIS — N250 Renal osteodystrophy: Secondary | ICD-10-CM | POA: Diagnosis not present

## 2023-05-11 DIAGNOSIS — N2581 Secondary hyperparathyroidism of renal origin: Secondary | ICD-10-CM | POA: Diagnosis not present

## 2023-05-11 DIAGNOSIS — Z992 Dependence on renal dialysis: Secondary | ICD-10-CM | POA: Diagnosis not present

## 2023-05-11 DIAGNOSIS — N186 End stage renal disease: Secondary | ICD-10-CM | POA: Diagnosis not present

## 2023-05-12 ENCOUNTER — Ambulatory Visit: Admitting: Family Medicine

## 2023-05-12 ENCOUNTER — Other Ambulatory Visit: Payer: Self-pay | Admitting: Family Medicine

## 2023-05-12 ENCOUNTER — Encounter: Payer: Self-pay | Admitting: Family Medicine

## 2023-05-12 VITALS — BP 122/48 | HR 70 | Temp 98.2°F | Wt 195.9 lb

## 2023-05-12 DIAGNOSIS — Z992 Dependence on renal dialysis: Secondary | ICD-10-CM | POA: Diagnosis not present

## 2023-05-12 DIAGNOSIS — D509 Iron deficiency anemia, unspecified: Secondary | ICD-10-CM | POA: Diagnosis not present

## 2023-05-12 DIAGNOSIS — A0472 Enterocolitis due to Clostridium difficile, not specified as recurrent: Secondary | ICD-10-CM | POA: Diagnosis not present

## 2023-05-12 DIAGNOSIS — I959 Hypotension, unspecified: Secondary | ICD-10-CM | POA: Diagnosis not present

## 2023-05-12 DIAGNOSIS — K922 Gastrointestinal hemorrhage, unspecified: Secondary | ICD-10-CM

## 2023-05-12 DIAGNOSIS — E1142 Type 2 diabetes mellitus with diabetic polyneuropathy: Secondary | ICD-10-CM | POA: Diagnosis not present

## 2023-05-12 DIAGNOSIS — N186 End stage renal disease: Secondary | ICD-10-CM | POA: Diagnosis not present

## 2023-05-12 NOTE — Progress Notes (Signed)
 Established Patient Office Visit  Subjective   Patient ID: Anthony Bullock, male    DOB: 1960/08/26  Age: 63 y.o. MRN: [id number]  Chief Complaint  Patient presents with   Hospitalization Follow-up    HPI   Seen for hospital follow-up for ulcerative colitis, severe blood loss anemia secondary to upper GI bleed, and hypotension.  He was just seen here April 11 following hospitalization for recent NSTEMI.  Had been discharged on aspirin  in addition to Plavix  and Eliquis .  History of intermittent atrial fibrillation.  He was doing reasonly well at that time.  However, he developed some diarrhea, nausea, vomiting after dialysis and became extremely weak and was noted to have hemoglobin of 5.4.  He apparently also had noticed some dark-colored stools.  Fecal occult blood test was positive.  Underwent EGD which showed superficial duodenal ulcers.  He was felt like he may have had some distress ulcers related to hypotension from dialysis.  He was placed on PPI and remains on Protonix  40 mg twice daily.  Eliquis  and aspirin  were discontinued.  He remains on Plavix .  Helicobacter pylori testing was negative.  Biopsies negative for any dysplasia.  Patient received 4 units of blood and discharge hemoglobin was 9 range.  He has not noted any further melena.  No abdominal pain.  No dizziness.  History of atrial fibrillation.  On amiodarone .  Positive C. difficile colitis.  Was treated with vancomycin  and transitioned to Fidaxomicin  -cost was prohibitive.  Currently on vancomycin .  Stools have slowed down to 2 loose stools per day.  No fever.  He does have end-stage renal disease on hemodialysis every Tuesday, Thursday, and Saturday.  History of hypotension.  Currently on midodrine  10 mg 3 times daily.  Blood pressures actually up today when he first got here but came down significantly after rest.  Past Medical History:  Diagnosis Date   AICD (automatic cardioverter/defibrillator) present    [employer]   Allergic rhinitis    Anemia    Arthritis    Chronic systolic heart failure (HCC)    a. ECHO (12/2012) EF 25-30%, HK entireanteroseptal myocardium //  b.  EF 25%, diffuse HK, grade 1 diastolic dysfunction, MAC, mild LAE, normal RVSF, trivial pericardial effusion   COPD (chronic obstructive pulmonary disease) (HCC)    Diabetes mellitus type II    Diabetic nephropathy (HCC)    Diabetic neuropathy (HCC)    ESRD on hemodialysis (HCC)    started HD June 2017, goes to [facility] HD unit, Dr Lieutenant Reese   History of cardiac catheterization    a.Myoview  1/15:  There is significant left ventricular dysfunction. There may be slight scar at the apex. There is no significant ischemia. LV Ejection Fraction: 27%  //  b. RHC/LHC (1/15) with mean RA 6, PA 47/22 mean 33, mean PCWP 20, PVR 2.5 WU, CI 2.5; 80% dLAD stenosis, 70% diffuse large D.     History of kidney stones    Hyperlipidemia    Hypertension    Kidney stones    NICM (nonischemic cardiomyopathy) (HCC)    Primarily nonischemic.  Echo (12/14) with EF 25-30%.  Echo (3/15) with EF 25%, mild to moderately dilated LV, normal RV size and systolic function.     Osteomyelitis (HCC)    left fifth ray   Pneumonia    Urethral stricture    Wears glasses    Past Surgical History:  Procedure Laterality Date   ABDOMINAL AORTOGRAM W/LOWER EXTREMITY N/A 03/30/2016  Procedure: Abdominal Aortogram w/Lower Extremity;  Surgeon: Dannis Dy, MD;  Location: [facility] INVASIVE CV LAB;  Service: Cardiovascular;  Laterality: N/A;   AMPUTATION Right 04/26/2016   Procedure: Right Below Knee Amputation;  Surgeon: Timothy Ford, MD;  Location: [facility] OR;  Service: Orthopedics;  Laterality: Right;   AMPUTATION Left 08/21/2019   Procedure: LEFT FOOT 5TH RAY AMPUTATION;  Surgeon: Timothy Ford, MD;  Location: [facility] OR;  Service: Orthopedics;  Laterality: Left;   AMPUTATION Left 11/13/2019   Procedure: LEFT BELOW KNEE AMPUTATION;  Surgeon: Timothy Ford, MD;   Location: [facility] OR;  Service: Orthopedics;  Laterality: Left;   AV FISTULA PLACEMENT Right 09/08/2015   Procedure: INSERTION OF 4-64mm x 45cm  ARTERIOVENOUS (AV) GORE-TEX GRAFT RIGHT UPPER  ARM;  Surgeon: Dannis Dy, MD;  Location: [facility] OR;  Service: Vascular;  Laterality: Right;   AV FISTULA PLACEMENT Left 01/14/2016   Procedure: CREATION OF LEFT UPPER ARM ARTERIOVENOUS FISTULA;  Surgeon: Dannis Dy, MD;  Location: [facility] OR;  Service: Vascular;  Laterality: Left;   BASCILIC VEIN TRANSPOSITION Right 08/22/2014   Procedure: RIGHT UPPER ARM BASCILIC VEIN TRANSPOSITION;  Surgeon: Dannis Dy, MD;  Location: [facility] OR;  Service: Vascular;  Laterality: Right;   BELOW KNEE LEG AMPUTATION Right 04/26/2016   BIOPSY OF SKIN SUBCUTANEOUS TISSUE AND/OR MUCOUS MEMBRANE  04/28/2023   Procedure: BIOPSY, SKIN, SUBCUTANEOUS TISSUE, OR MUCOUS MEMBRANE;  Surgeon: Sergio Dandy, MD;  Location: [facility] ENDOSCOPY;  Service: Gastroenterology;;   CARDIAC CATHETERIZATION     CARDIAC DEFIBRILLATOR PLACEMENT  06/27/2013   Sub Q       BY DR Rodolfo Clan   CATARACT EXTRACTION W/PHACO Right 08/06/2018   Procedure: CATARACT EXTRACTION PHACO AND INTRAOCULAR LENS PLACEMENT (IOC);  Surgeon: Tarri Farm, MD;  Location: [facility] ORS;  Service: Ophthalmology;  Laterality: Right;  CDE: 4.06   CATARACT EXTRACTION W/PHACO Left 08/20/2018   Procedure: CATARACT EXTRACTION PHACO AND INTRAOCULAR LENS PLACEMENT (IOC);  Surgeon: Tarri Farm, MD;  Location: [facility] ORS;  Service: Ophthalmology;  Laterality: Left;  CDE: 6.76   COLONOSCOPY WITH PROPOFOL  N/A 07/22/2015   Procedure: COLONOSCOPY WITH PROPOFOL ;  Surgeon: Albertina Hugger, MD;  Location: [facility] ENDOSCOPY;  Service: Gastroenterology;  Laterality: N/A;   CORONARY LITHOTRIPSY N/A 04/14/2023   Procedure: CORONARY LITHOTRIPSY;  Surgeon: Arleen Lacer, MD;  Location: [facility] INVASIVE CV LAB;  Service: Cardiovascular;  Laterality: N/A;  RCA   CORONARY STENT INTERVENTION N/A 04/12/2023    Procedure: CORONARY STENT INTERVENTION;  Surgeon: Swaziland, Peter M, MD;  Location: [facility] INVASIVE CV LAB;  Service: Cardiovascular;  Laterality: N/A;   CORONARY STENT INTERVENTION N/A 04/14/2023   Procedure: CORONARY STENT INTERVENTION;  Surgeon: Arleen Lacer, MD;  Location: [facility] INVASIVE CV LAB;  Service: Cardiovascular;  Laterality: N/A;   ESOPHAGOGASTRODUODENOSCOPY N/A 04/28/2023   Procedure: EGD (ESOPHAGOGASTRODUODENOSCOPY);  Surgeon: Nandigam, Kavitha V, MD;  Location: [facility] ENDOSCOPY;  Service: Gastroenterology;  Laterality: N/A;   FEMORAL-POPLITEAL BYPASS GRAFT Right 03/31/2016   Procedure: BYPASS GRAFT FEMORAL-POPLITEAL ARTERY USING RIGHT GREATER SAPHENOUS NONREVERSED VEIN;  Surgeon: Dannis Dy, MD;  Location: [facility] OR;  Service: Vascular;  Laterality: Right;   HERNIA REPAIR     I & D EXTREMITY Right 03/31/2016   Procedure: IRRIGATION AND DEBRIDEMENT FOOT;  Surgeon: Dannis Dy, MD;  Location: [facility] OR;  Service: Vascular;  Laterality: Right;   IMPLANTABLE CARDIOVERTER DEFIBRILLATOR IMPLANT N/A 06/27/2013   Procedure: SUB Q ICD;  Surgeon: Verona Goodwill, MD;  Location:  [facility] CATH LAB;  Service: Cardiovascular;  Laterality: N/A;   INTRAOPERATIVE ARTERIOGRAM Right 03/31/2016   Procedure: INTRA OPERATIVE ARTERIOGRAM;  Surgeon: Dannis Dy, MD;  Location: [facility] OR;  Service: Vascular;  Laterality: Right;   IR GENERIC HISTORICAL Right 11/30/2015   IR THROMBECTOMY AV FISTULA W/THROMBOLYSIS/PTA INC/SHUNT/IMG RIGHT 11/30/2015 Erica Hau, MD [facility]   IR GENERIC HISTORICAL  11/30/2015   IR US  GUIDE VASC ACCESS RIGHT 11/30/2015 Erica Hau, MD [facility]   IR GENERIC HISTORICAL Right 12/15/2015   IR THROMBECTOMY AV FISTULA W/THROMBOLYSIS/PTA/STENT INC/SHUNT/IMG RT 12/15/2015 Marland Silvas, MD [facility]   IR GENERIC HISTORICAL  12/15/2015   IR US  GUIDE VASC ACCESS RIGHT 12/15/2015 Marland Silvas, MD [facility]   IR GENERIC HISTORICAL  12/28/2015   IR FLUORO GUIDE CV  LINE RIGHT 12/28/2015 Artice Last, MD [facility]   IR GENERIC HISTORICAL  12/28/2015   IR US  GUIDE VASC ACCESS RIGHT 12/28/2015 Artice Last, MD [facility]   LEFT A ND RIGHT HEART CATH  01/30/2013   DR Dasie Epps   LEFT AND RIGHT HEART CATHETERIZATION WITH CORONARY ANGIOGRAM N/A 01/30/2013   Procedure: LEFT AND RIGHT HEART CATHETERIZATION WITH CORONARY ANGIOGRAM;  Surgeon: Mardell Shade, MD;  Location: [facility] CATH LAB;  Service: Cardiovascular;  Laterality: N/A;   LEFT HEART CATH AND CORONARY ANGIOGRAPHY N/A 04/12/2023   Procedure: LEFT HEART CATH AND CORONARY ANGIOGRAPHY;  Surgeon: Swaziland, Peter M, MD;  Location: [facility] INVASIVE CV LAB;  Service: Cardiovascular;  Laterality: N/A;   PERIPHERAL VASCULAR CATHETERIZATION Right 01/26/2015   Procedure: A/V Fistulagram;  Surgeon: Dannis Dy, MD;  Location: [facility] INVASIVE CV LAB;  Service: Cardiovascular;  Laterality: Right;   reapea urethral surgery for recurrent obstruction  2011   TOTAL KNEE ARTHROPLASTY Right 2007   VEIN HARVEST Right 03/31/2016   Procedure: RIGHT GREATER SAPHENOUS VEIN HARVEST;  Surgeon: Dannis Dy, MD;  Location: [facility] OR;  Service: Vascular;  Laterality: Right;    reports that he quit smoking about 13 years ago. His smoking use included cigarettes. He started smoking about 45 years ago. He has a 64 pack-year smoking history. He has never used smokeless tobacco. He reports that he does not drink alcohol  and does not use drugs. family history includes Alcohol  abuse in his father; Bladder Cancer in his mother; Diabetes in his maternal grandmother; Heart Problems in his maternal grandmother; Heart disease in his maternal grandfather; Melanoma in his father; Prostate cancer in his maternal grandfather; Stroke in his maternal grandmother. Allergies  Allergen Reactions   Epoetin  Alfa Other (See Comments)    Unknown    Ferumoxytol  Other (See Comments)    Unknown    Morphine Sulfate Rash and Other (See Comments)    Itches  all over, red spots    Review of Systems  Constitutional:  Negative for chills and fever.  Cardiovascular:  Negative for chest pain.  Gastrointestinal:  Negative for abdominal pain, blood in stool, constipation, melena, nausea and vomiting.      Objective:     BP (!) 160/60 (BP Location: Left Arm, Patient Position: Sitting, Cuff Size: Normal)   Pulse 70   Temp 98.2 F (36.8 C) (Oral)   Wt 195 lb 14.4 oz (88.9 kg)   SpO2 95%   BMI 28.11 kg/m  BP Readings from Last 3 Encounters:  05/12/23 (!) 122/48  05/03/23 134/78  05/02/23 (!) 124/55   Wt Readings from Last 3 Encounters:  05/12/23 195 lb 14.4 oz (88.9 kg)  05/03/23 205  lb 8 oz (93.2 kg)  05/02/23 188 lb 0.8 oz (85.3 kg)      Physical Exam Vitals reviewed.  Constitutional:      General: He is not in acute distress.    Appearance: He is not ill-appearing.  Cardiovascular:     Rate and Rhythm: Normal rate and regular rhythm.  Pulmonary:     Effort: Pulmonary effort is normal.     Breath sounds: Normal breath sounds. No wheezing or rales.  Neurological:     Mental Status: He is alert.      No results found for any visits on 05/12/23.  Last CBC Lab Results  Component Value Date   WBC 12.3 (H) 05/02/2023   HGB 9.1 (L) 05/02/2023   HCT 28.1 (L) 05/02/2023   MCV 93.7 05/02/2023   MCH 30.3 05/02/2023   RDW 17.2 (H) 05/02/2023   PLT 245 05/02/2023   Last metabolic panel Lab Results  Component Value Date   GLUCOSE 127 (H) 05/02/2023   NA 137 05/02/2023   K 3.9 05/02/2023   CL 98 05/02/2023   CO2 22 05/02/2023   BUN 62 (H) 05/02/2023   CREATININE 8.88 (H) 05/02/2023   GFRNONAA 6 (L) 05/02/2023   CALCIUM  8.3 (L) 05/02/2023   PHOS 5.0 (H) 05/02/2023   PROT 5.9 (L) 04/30/2023   ALBUMIN  2.1 (L) 05/02/2023   BILITOT 0.8 04/30/2023   ALKPHOS 71 04/30/2023   AST 47 (H) 04/30/2023   ALT 25 04/30/2023   ANIONGAP 17 (H) 05/02/2023   Last lipids Lab Results  Component Value Date   CHOL 78 04/11/2023    HDL 30 (L) 04/11/2023   LDLCALC 7 04/11/2023   LDLDIRECT 134.0 04/25/2011   TRIG 203 (H) 04/11/2023   CHOLHDL 2.6 04/11/2023   Last hemoglobin A1c Lab Results  Component Value Date   HGBA1C 6.6 (A) 03/20/2023   Last thyroid  functions Lab Results  Component Value Date   TSH 3.090 05/02/2023      The ASCVD Risk score (Arnett DK, et al., 2019) failed to calculate for the following reasons:   Risk score cannot be calculated because patient has a medical history suggesting prior/existing ASCVD    Assessment & Plan:   63 year-old male with complex past medical history who is seen for transitional care follow-up following recent admission for hypotension, severe anemia, upper GI bleed, C. Difficile  #1 C. difficile colitis.  Currently on long taper of vancomycin  for several weeks.  Symptomatically improved.  Appetite fair.  Having about 2 loose stools per day.  Follow-up promptly for any further nausea or vomiting.  #2 upper GI bleed with severe anemia.  Upper GI bleed secondary to duodenal ulcers.  Patient now on PPI.  Helicobacter pylori testing negative.  Recheck CBC today Watch closely for any recurrent melena or other concerns  #3 history of atrial fibrillation.  Patient on amiodarone .  Recently taken off Eliquis  secondary to upper GI bleed.  Currently appears to be in sinus rhythm.  #4 longstanding history of type 2 diabetes.  Last A1c well-controlled.  Patient monitoring blood sugars regularly.  He has follow-up in June and reassess A1c then.  #5 history of recent hypotension.  Initial blood pressure was up today but came down substantially after rest.  Standing 122/48.  Continue midodrine  and continue close follow-up with nephrology  #6 end-stage renal disease on hemodialysis.  Continue close follow-up with nephrology.    No follow-ups on file.    Glean Lamy, MD

## 2023-05-13 DIAGNOSIS — N250 Renal osteodystrophy: Secondary | ICD-10-CM | POA: Diagnosis not present

## 2023-05-13 DIAGNOSIS — D509 Iron deficiency anemia, unspecified: Secondary | ICD-10-CM | POA: Diagnosis not present

## 2023-05-13 DIAGNOSIS — Z992 Dependence on renal dialysis: Secondary | ICD-10-CM | POA: Diagnosis not present

## 2023-05-13 DIAGNOSIS — N186 End stage renal disease: Secondary | ICD-10-CM | POA: Diagnosis not present

## 2023-05-13 DIAGNOSIS — N2581 Secondary hyperparathyroidism of renal origin: Secondary | ICD-10-CM | POA: Diagnosis not present

## 2023-05-13 DIAGNOSIS — D631 Anemia in chronic kidney disease: Secondary | ICD-10-CM | POA: Diagnosis not present

## 2023-05-13 LAB — CBC WITH DIFFERENTIAL/PLATELET
Basophils Absolute: 0.1 10*3/uL (ref 0.0–0.1)
Basophils Relative: 0.7 % (ref 0.0–3.0)
Eosinophils Absolute: 0.3 10*3/uL (ref 0.0–0.7)
Eosinophils Relative: 3.5 % (ref 0.0–5.0)
HCT: 33.2 % — ABNORMAL LOW (ref 39.0–52.0)
Hemoglobin: 10.9 g/dL — ABNORMAL LOW (ref 13.0–17.0)
Lymphocytes Relative: 11.2 % — ABNORMAL LOW (ref 12.0–46.0)
Lymphs Abs: 1.1 10*3/uL (ref 0.7–4.0)
MCHC: 32.9 g/dL (ref 30.0–36.0)
MCV: 92.3 fl (ref 78.0–100.0)
Monocytes Absolute: 1.4 10*3/uL — ABNORMAL HIGH (ref 0.1–1.0)
Monocytes Relative: 14.5 % — ABNORMAL HIGH (ref 3.0–12.0)
Neutro Abs: 6.7 10*3/uL (ref 1.4–7.7)
Neutrophils Relative %: 70.1 % (ref 43.0–77.0)
Platelets: 251 10*3/uL (ref 150.0–400.0)
RBC: 3.6 Mil/uL — ABNORMAL LOW (ref 4.22–5.81)
RDW: 15.1 % (ref 11.5–15.5)
WBC: 9.5 10*3/uL (ref 4.0–10.5)

## 2023-05-16 DIAGNOSIS — N250 Renal osteodystrophy: Secondary | ICD-10-CM | POA: Diagnosis not present

## 2023-05-16 DIAGNOSIS — D509 Iron deficiency anemia, unspecified: Secondary | ICD-10-CM | POA: Diagnosis not present

## 2023-05-16 DIAGNOSIS — D631 Anemia in chronic kidney disease: Secondary | ICD-10-CM | POA: Diagnosis not present

## 2023-05-16 DIAGNOSIS — N186 End stage renal disease: Secondary | ICD-10-CM | POA: Diagnosis not present

## 2023-05-16 DIAGNOSIS — Z992 Dependence on renal dialysis: Secondary | ICD-10-CM | POA: Diagnosis not present

## 2023-05-16 DIAGNOSIS — N2581 Secondary hyperparathyroidism of renal origin: Secondary | ICD-10-CM | POA: Diagnosis not present

## 2023-05-18 DIAGNOSIS — N186 End stage renal disease: Secondary | ICD-10-CM | POA: Diagnosis not present

## 2023-05-18 DIAGNOSIS — N250 Renal osteodystrophy: Secondary | ICD-10-CM | POA: Diagnosis not present

## 2023-05-18 DIAGNOSIS — Z992 Dependence on renal dialysis: Secondary | ICD-10-CM | POA: Diagnosis not present

## 2023-05-18 DIAGNOSIS — N2581 Secondary hyperparathyroidism of renal origin: Secondary | ICD-10-CM | POA: Diagnosis not present

## 2023-05-18 DIAGNOSIS — D631 Anemia in chronic kidney disease: Secondary | ICD-10-CM | POA: Diagnosis not present

## 2023-05-18 DIAGNOSIS — D509 Iron deficiency anemia, unspecified: Secondary | ICD-10-CM | POA: Diagnosis not present

## 2023-05-20 DIAGNOSIS — N186 End stage renal disease: Secondary | ICD-10-CM | POA: Diagnosis not present

## 2023-05-20 DIAGNOSIS — D631 Anemia in chronic kidney disease: Secondary | ICD-10-CM | POA: Diagnosis not present

## 2023-05-20 DIAGNOSIS — N250 Renal osteodystrophy: Secondary | ICD-10-CM | POA: Diagnosis not present

## 2023-05-20 DIAGNOSIS — Z992 Dependence on renal dialysis: Secondary | ICD-10-CM | POA: Diagnosis not present

## 2023-05-20 DIAGNOSIS — N2581 Secondary hyperparathyroidism of renal origin: Secondary | ICD-10-CM | POA: Diagnosis not present

## 2023-05-20 DIAGNOSIS — D509 Iron deficiency anemia, unspecified: Secondary | ICD-10-CM | POA: Diagnosis not present

## 2023-05-21 ENCOUNTER — Emergency Department (HOSPITAL_COMMUNITY)

## 2023-05-21 ENCOUNTER — Observation Stay (HOSPITAL_COMMUNITY)
Admission: EM | Admit: 2023-05-21 | Discharge: 2023-05-22 | Disposition: A | Discharge status: Home / Self Care | Attending: Internal Medicine | Admitting: Internal Medicine

## 2023-05-21 ENCOUNTER — Other Ambulatory Visit: Payer: Self-pay

## 2023-05-21 ENCOUNTER — Encounter (HOSPITAL_COMMUNITY): Payer: Self-pay

## 2023-05-21 DIAGNOSIS — R0602 Shortness of breath: Secondary | ICD-10-CM | POA: Diagnosis present

## 2023-05-21 DIAGNOSIS — K219 Gastro-esophageal reflux disease without esophagitis: Secondary | ICD-10-CM | POA: Diagnosis present

## 2023-05-21 DIAGNOSIS — I2510 Atherosclerotic heart disease of native coronary artery without angina pectoris: Secondary | ICD-10-CM | POA: Diagnosis not present

## 2023-05-21 DIAGNOSIS — I739 Peripheral vascular disease, unspecified: Secondary | ICD-10-CM | POA: Diagnosis not present

## 2023-05-21 DIAGNOSIS — J9601 Acute respiratory failure with hypoxia: Secondary | ICD-10-CM | POA: Insufficient documentation

## 2023-05-21 DIAGNOSIS — Z87891 Personal history of nicotine dependence: Secondary | ICD-10-CM | POA: Diagnosis not present

## 2023-05-21 DIAGNOSIS — Z95810 Presence of automatic (implantable) cardiac defibrillator: Secondary | ICD-10-CM | POA: Insufficient documentation

## 2023-05-21 DIAGNOSIS — K269 Duodenal ulcer, unspecified as acute or chronic, without hemorrhage or perforation: Secondary | ICD-10-CM | POA: Insufficient documentation

## 2023-05-21 DIAGNOSIS — R0989 Other specified symptoms and signs involving the circulatory and respiratory systems: Secondary | ICD-10-CM | POA: Diagnosis not present

## 2023-05-21 DIAGNOSIS — Z89511 Acquired absence of right leg below knee: Secondary | ICD-10-CM | POA: Diagnosis not present

## 2023-05-21 DIAGNOSIS — N186 End stage renal disease: Secondary | ICD-10-CM | POA: Diagnosis not present

## 2023-05-21 DIAGNOSIS — I10 Essential (primary) hypertension: Secondary | ICD-10-CM | POA: Diagnosis present

## 2023-05-21 DIAGNOSIS — I4891 Unspecified atrial fibrillation: Secondary | ICD-10-CM | POA: Diagnosis present

## 2023-05-21 DIAGNOSIS — Z992 Dependence on renal dialysis: Secondary | ICD-10-CM | POA: Diagnosis not present

## 2023-05-21 DIAGNOSIS — Z7902 Long term (current) use of antithrombotics/antiplatelets: Secondary | ICD-10-CM | POA: Insufficient documentation

## 2023-05-21 DIAGNOSIS — I132 Hypertensive heart and chronic kidney disease with heart failure and with stage 5 chronic kidney disease, or end stage renal disease: Secondary | ICD-10-CM | POA: Diagnosis not present

## 2023-05-21 DIAGNOSIS — R0689 Other abnormalities of breathing: Secondary | ICD-10-CM | POA: Diagnosis not present

## 2023-05-21 DIAGNOSIS — R918 Other nonspecific abnormal finding of lung field: Secondary | ICD-10-CM | POA: Diagnosis not present

## 2023-05-21 DIAGNOSIS — Z955 Presence of coronary angioplasty implant and graft: Secondary | ICD-10-CM

## 2023-05-21 DIAGNOSIS — I5022 Chronic systolic (congestive) heart failure: Secondary | ICD-10-CM | POA: Diagnosis present

## 2023-05-21 DIAGNOSIS — K922 Gastrointestinal hemorrhage, unspecified: Secondary | ICD-10-CM | POA: Insufficient documentation

## 2023-05-21 DIAGNOSIS — Z7982 Long term (current) use of aspirin: Secondary | ICD-10-CM | POA: Diagnosis not present

## 2023-05-21 DIAGNOSIS — I480 Paroxysmal atrial fibrillation: Secondary | ICD-10-CM | POA: Diagnosis not present

## 2023-05-21 DIAGNOSIS — Z794 Long term (current) use of insulin: Secondary | ICD-10-CM | POA: Insufficient documentation

## 2023-05-21 DIAGNOSIS — E785 Hyperlipidemia, unspecified: Secondary | ICD-10-CM | POA: Insufficient documentation

## 2023-05-21 DIAGNOSIS — R079 Chest pain, unspecified: Secondary | ICD-10-CM | POA: Diagnosis not present

## 2023-05-21 DIAGNOSIS — E8770 Fluid overload, unspecified: Principal | ICD-10-CM

## 2023-05-21 DIAGNOSIS — Z89512 Acquired absence of left leg below knee: Secondary | ICD-10-CM | POA: Diagnosis not present

## 2023-05-21 DIAGNOSIS — J90 Pleural effusion, not elsewhere classified: Secondary | ICD-10-CM | POA: Diagnosis not present

## 2023-05-21 DIAGNOSIS — I959 Hypotension, unspecified: Secondary | ICD-10-CM | POA: Diagnosis not present

## 2023-05-21 DIAGNOSIS — J810 Acute pulmonary edema: Secondary | ICD-10-CM | POA: Diagnosis not present

## 2023-05-21 DIAGNOSIS — E1169 Type 2 diabetes mellitus with other specified complication: Secondary | ICD-10-CM | POA: Insufficient documentation

## 2023-05-21 DIAGNOSIS — J449 Chronic obstructive pulmonary disease, unspecified: Secondary | ICD-10-CM | POA: Diagnosis present

## 2023-05-21 DIAGNOSIS — R0789 Other chest pain: Secondary | ICD-10-CM | POA: Diagnosis not present

## 2023-05-21 LAB — I-STAT VENOUS BLOOD GAS, ED
Acid-Base Excess: 1 mmol/L (ref 0.0–2.0)
Bicarbonate: 24.4 mmol/L (ref 20.0–28.0)
Calcium, Ion: 0.96 mmol/L — ABNORMAL LOW (ref 1.15–1.40)
HCT: 36 % — ABNORMAL LOW (ref 39.0–52.0)
Hemoglobin: 12.2 g/dL — ABNORMAL LOW (ref 13.0–17.0)
O2 Saturation: 91 %
Potassium: 3.7 mmol/L (ref 3.5–5.1)
Sodium: 137 mmol/L (ref 135–145)
TCO2: 25 mmol/L (ref 22–32)
pCO2, Ven: 33 mmHg — ABNORMAL LOW (ref 44–60)
pH, Ven: 7.476 — ABNORMAL HIGH (ref 7.25–7.43)
pO2, Ven: 55 mmHg — ABNORMAL HIGH (ref 32–45)

## 2023-05-21 LAB — CBC
HCT: 36.6 % — ABNORMAL LOW (ref 39.0–52.0)
Hemoglobin: 11.4 g/dL — ABNORMAL LOW (ref 13.0–17.0)
MCH: 29.7 pg (ref 26.0–34.0)
MCHC: 31.1 g/dL (ref 30.0–36.0)
MCV: 95.3 fL (ref 80.0–100.0)
Platelets: 207 10*3/uL (ref 150–400)
RBC: 3.84 MIL/uL — ABNORMAL LOW (ref 4.22–5.81)
RDW: 14.8 % (ref 11.5–15.5)
WBC: 9.6 10*3/uL (ref 4.0–10.5)
nRBC: 0 % (ref 0.0–0.2)

## 2023-05-21 LAB — BASIC METABOLIC PANEL WITH GFR
Anion gap: 13 (ref 5–15)
BUN: 24 mg/dL — ABNORMAL HIGH (ref 8–23)
CO2: 23 mmol/L (ref 22–32)
Calcium: 7.8 mg/dL — ABNORMAL LOW (ref 8.9–10.3)
Chloride: 100 mmol/L (ref 98–111)
Creatinine, Ser: 7.39 mg/dL — ABNORMAL HIGH (ref 0.61–1.24)
GFR, Estimated: 8 mL/min — ABNORMAL LOW (ref >60)
Glucose, Bld: 152 mg/dL — ABNORMAL HIGH (ref 70–99)
Potassium: 3.7 mmol/L (ref 3.5–5.1)
Sodium: 136 mmol/L (ref 135–145)

## 2023-05-21 LAB — PROTIME-INR
INR: 1.1 (ref 0.8–1.2)
Prothrombin Time: 14.7 s (ref 11.4–15.2)

## 2023-05-21 LAB — TROPONIN I (HIGH SENSITIVITY): Troponin I (High Sensitivity): 47 ng/L — ABNORMAL HIGH (ref <18)

## 2023-05-21 LAB — BRAIN NATRIURETIC PEPTIDE: B Natriuretic Peptide: 2179 pg/mL — ABNORMAL HIGH (ref 0.0–100.0)

## 2023-05-21 NOTE — ED Triage Notes (Signed)
 Arrives [place] [facility] from home after substernal chest pain manifested while at rest. Pt took 1 - nitroglycerine SL little over an hour ago.   Dialysis T, TH, S. No missed appointments.   Recent admission for NSTEMI and had several cardiac stents placed.   Arrives on CPAP but trialed on 4L . Pt tolerated for ~10 minutes until needing to go on Bipap.

## 2023-05-21 NOTE — ED Provider Notes (Signed)
  EMERGENCY DEPARTMENT AT [facility] Provider Note   CSN: [id number] Arrival date & time: 05/21/23  2201     History  Chief Complaint  Patient presents with   Chest Pain    Anthony Bullock is a 63 y.o. male with an extensive medical history to include ESRD on HD Tuesday Thursday Saturday, PVD, diabetes, anemia, bilateral BKA, hypertension, atrial fibrillation, hyperlipidemia.  Patient recently admitted to hospital secondary to upper GI bleed with C. difficile colitis, NSTEMI.  Patient presents to ED for evaluation of chest pain and shortness of breath.  States that he was at home in normal state of health, stating he felt very well.  Reports that he attempted to swallow medication, pill, and it became lodged in his throat.  He states that it took him about 1 minute to clear the pill and swallow it.  States that after this occurred he started having  midsternal chest pain associated with shortness of breath.  He is unsure of an exertional component to this.  He took a nitroglycerin  which took his chest pain from a 9 out of 10 to a 3 out of 10 and called EMS.  EMS apparently put patient on CPAP secondary to patient request.  Patient was transitioned off of CPAP upon arrival to the ED and has an oxygen  saturation 100% on nasal cannula.  Patient is requesting to go back on BiPAP.  He denies any diarrhea at home.  Denies any nausea, vomiting, fevers.  Denies lightheadedness, dizziness or weakness.  Denies any home oxygen  use.  Reports that he was last dialyzed on Saturday, yesterday.  States he is tapering off vancomycin  when she was placed on for C. difficile colitis.   Chest Pain Associated symptoms: shortness of breath        Home Medications Prior to Admission medications   Medication Sig Start Date End Date Taking? Authorizing Provider  acetaminophen  (TYLENOL ) 325 MG tablet Take 1-2 tablets (325-650 mg total) by mouth every 4 (four) hours as needed for mild pain. 11/29/19   Yes Love, Renay Carota, PA-C  amiodarone  (PACERONE ) 200 MG tablet Take 1 tablet (200 mg total) by mouth 2 (two) times daily for 20 days, THEN 1 tablet (200 mg total) daily. 05/02/23 06/21/23 Yes Jodeane Mulligan, DO  atorvastatin  (LIPITOR ) 80 MG tablet TAKE 1 TABLET BY MOUTH AT BEDTIME Patient taking differently: Take 80 mg by mouth daily. 06/24/22  Yes Burchette, Marijean Shouts, MD  carvedilol  (COREG ) 12.5 MG tablet Take 1 tablet (12.5 mg total) by mouth 2 (two) times daily. 01/25/23  Yes Burchette, Marijean Shouts, MD  clopidogrel  (PLAVIX ) 75 MG tablet Take 1 tablet (75 mg total) by mouth daily with breakfast. 04/16/23  Yes Hongalgi, Anand D, MD  gabapentin  (NEURONTIN ) 300 MG capsule Take 1 capsule (300 mg total) by mouth at bedtime. 04/15/23  Yes Hongalgi, Anand D, MD  HUMALOG  KWIKPEN 200 UNIT/ML KwikPen INJECT A MAXIMUM OF 28 UNITS SUBCUTANEOUSLY TWICE DAILY WITH LUNCH AND SUPPER PER SLIDING SCALE. APPOINTMENT REQUIRED FOR FUTURE REFILLS Patient taking differently: Inject 7 Units into the skin 3 (three) times daily with meals. Per sliding scale 11/11/22  Yes Burchette, Marijean Shouts, MD  LANTUS  SOLOSTAR 100 UNIT/ML Solostar Pen INJECT 12 UNITS SUBCUTANEOUSLY AT BEDTIME Patient taking differently: Inject 14 Units into the skin at bedtime. 07/15/22  Yes Burchette, Marijean Shouts, MD  midodrine  (PROAMATINE ) 10 MG tablet Take 1 tablet (10 mg total) by mouth every 8 (eight) hours. 05/02/23  Yes Macarthur Savory,  Rudean Corrente, DO  multivitamin (RENA-VIT) TABS tablet Take 1 tablet by mouth once daily Patient taking differently: Take 1 tablet by mouth daily. 07/06/21  Yes Burchette, Marijean Shouts, MD  nitroGLYCERIN  (NITROSTAT ) 0.4 MG SL tablet Place 1 tablet (0.4 mg total) under the tongue every 5 (five) minutes as needed for chest pain. 04/21/23  Yes Burchette, Marijean Shouts, MD  Olopatadine  HCl 0.2 % SOLN Place 1 drop into both eyes daily as needed (for allergies).    Yes [provider]  pantoprazole  (PROTONIX ) 40 MG tablet Take 1 tablet (40 mg total) by mouth  every 12 (twelve) hours. 05/02/23  Yes Jodeane Mulligan, DO  sevelamer  (RENAGEL ) 800 MG tablet Take 1,600 mg by mouth 3 (three) times daily with meals.   Yes [provider]  vancomycin  (VANCOCIN ) 125 MG capsule Take 1 capsule (125 mg total) by mouth 4 (four) times daily for 14 days, THEN 1 capsule (125 mg total) 2 (two) times daily for 7 days, THEN 1 capsule (125 mg total) daily for 7 days, THEN 1 capsule (125 mg total) every other day for 28 days, THEN 1 capsule (125 mg total) every 3 (three) days for 28 days. 05/02/23 07/25/23 Yes Jodeane Mulligan, DO  VENTOLIN  HFA 108 (90 Base) MCG/ACT inhaler INHALE 1 TO 2 PUFFS BY MOUTH EVERY 6 HOURS AS NEEDED FOR WHEEZING FOR SHORTNESS OF BREATH 05/05/23  Yes Burchette, Marijean Shouts, MD  aspirin  EC 81 MG tablet Take 1 tablet (81 mg total) by mouth daily. Swallow whole. Patient not taking: Reported on 05/22/2023 04/15/23   Casey Clay, MD  cinacalcet  (SENSIPAR ) 30 MG tablet Take 1 tablet (30 mg total) by mouth Every Tuesday,Thursday,and Saturday with dialysis. 11/30/19   Love, Renay Carota, PA-C  Continuous Glucose Receiver (FREESTYLE LIBRE 3 READER) DEVI Use to check blood glucose TID 08/11/22   Burchette, Marijean Shouts, MD  Continuous Glucose Sensor (FREESTYLE LIBRE 3 PLUS SENSOR) MISC USE AS DIRECTED TO CHECK GLUCOSE DAILY. CHANGE EVERY 15 DAYS 05/12/23   Burchette, Marijean Shouts, MD  glucose blood test strip Check 1 time daily. E11.9 One Touch Ultra Blue Test Strips 03/10/14   Burchette, Marijean Shouts, MD  Insulin  Pen Needle (BD PEN NEEDLE NANO U/F) 32G X 4 MM MISC USE 1 PEN NEEDLE SUBCUTANEOUSLY WITH INSULIN  4 TIMES DAILY 12/04/19   Burchette, Marijean Shouts, MD      Allergies    Epoetin  alfa, Ferumoxytol , and Morphine sulfate    Review of Systems   Review of Systems  Respiratory:  Positive for shortness of breath.   Cardiovascular:  Positive for chest pain.  All other systems reviewed and are negative.   Physical Exam Updated Vital Signs BP (!) 148/60   Pulse 81   Temp 97.7  F (36.5 C) (Temporal)   Resp 15   Ht 5\' 10"  (1.778 m)   Wt 88.5 kg   SpO2 100%   BMI 27.98 kg/m  Physical Exam Vitals and nursing note reviewed.  Constitutional:      General: He is not in acute distress.    Appearance: He is well-developed.  HENT:     Head: Normocephalic and atraumatic.  Eyes:     Conjunctiva/sclera: Conjunctivae normal.  Cardiovascular:     Rate and Rhythm: Normal rate and regular rhythm.     Heart sounds: No murmur heard. Pulmonary:     Effort: Pulmonary effort is normal. No respiratory distress.     Breath sounds: Rales present.  Abdominal:  Palpations: Abdomen is soft.     Tenderness: There is no abdominal tenderness.  Musculoskeletal:        General: No swelling.     Cervical back: Neck supple.     Comments: Bilateral BKA     Right Lower Extremity: Right leg is amputated below knee.     Left Lower Extremity: Left leg is amputated below knee.  Skin:    General: Skin is warm and dry.     Capillary Refill: Capillary refill takes less than 2 seconds.  Neurological:     Mental Status: He is alert and oriented to person, place, and time. Mental status is at baseline.  Psychiatric:        Mood and Affect: Mood normal.     ED Results / Procedures / Treatments   Labs (all labs ordered are listed, but only abnormal results are displayed) Labs Reviewed  BASIC METABOLIC PANEL WITH GFR - Abnormal; Notable for the following components:      Result Value   Glucose, Bld 152 (*)    BUN 24 (*)    Creatinine, Ser 7.39 (*)    Calcium  7.8 (*)    GFR, Estimated 8 (*)    All other components within normal limits  CBC - Abnormal; Notable for the following components:   RBC 3.84 (*)    Hemoglobin 11.4 (*)    HCT 36.6 (*)    All other components within normal limits  BRAIN NATRIURETIC PEPTIDE - Abnormal; Notable for the following components:   B Natriuretic Peptide 2,179.0 (*)    All other components within normal limits  I-STAT VENOUS BLOOD GAS, ED -  Abnormal; Notable for the following components:   pH, Ven 7.476 (*)    pCO2, Ven 33.0 (*)    pO2, Ven 55 (*)    Calcium , Ion 0.96 (*)    HCT 36.0 (*)    Hemoglobin 12.2 (*)    All other components within normal limits  TROPONIN I (HIGH SENSITIVITY) - Abnormal; Notable for the following components:   Troponin I (High Sensitivity) 47 (*)    All other components within normal limits  PROTIME-INR  HEPATITIS B SURFACE ANTIGEN  HEPATITIS B SURFACE ANTIBODY, QUANTITATIVE  TROPONIN I (HIGH SENSITIVITY)    EKG EKG Interpretation Date/Time:  Sunday May 21 2023 22:08:35 EDT Ventricular Rate:  84 PR Interval:  196 QRS Duration:  137 QT Interval:  452 QTC Calculation: 535 R Axis:   -31  Text Interpretation: Sinus rhythm Left bundle branch block afib no longer present. otherwise ST/T changes similar to last month Confirmed by Jerilynn Montenegro [phone]) on 05/21/2023 10:10:59 PM  Radiology DG Chest Port 1 View Result Date: 05/21/2023 CLINICAL DATA:  Shortness of breath and chest pain. History of end-stage renal disease. EXAM: PORTABLE CHEST 1 VIEW COMPARISON:  04/30/2023 FINDINGS: Stable heart size and mediastinal contours. Cardiomegaly is stable. Right-sided dialysis catheter in place. Subcutaneous pacer. Moderate right pleural effusion is increased from prior exam. May be a small left pleural effusion. Vascular congestion. No pneumothorax. IMPRESSION: 1. Moderate right pleural effusion, increased from prior exam. Possible small left pleural effusion. 2. Stable cardiomegaly.  Vascular congestion. Electronically Signed   By: Chadwick Colonel M.D.   On: 05/21/2023 23:08    Procedures .Critical Care  Performed by: Adel Aden, PA-C Authorized by: Adel Aden, PA-C   Critical care provider statement:    Critical care time (minutes):  55   Critical care time was exclusive of:  Separately billable procedures and treating other patients   Critical care was necessary to treat or  prevent imminent or life-threatening deterioration of the following conditions:  Respiratory failure (Hypoxia)   Critical care was time spent personally by me on the following activities:  Blood draw for specimens, development of treatment plan with patient or surrogate, discussions with consultants, discussions with primary provider, evaluation of patient's response to treatment, examination of patient, interpretation of cardiac output measurements, obtaining history from patient or surrogate, ordering and performing treatments and interventions, ordering and review of laboratory studies, ordering and review of radiographic studies, pulse oximetry, re-evaluation of patient's condition and review of old charts   I assumed direction of critical care for this patient from another provider in my specialty: no     Care discussed with: admitting provider      Medications Ordered in ED Medications  Chlorhexidine  Gluconate Cloth 2 % PADS 6 each (has no administration in time range)  pentafluoroprop-tetrafluoroeth (GEBAUERS) aerosol 1 Application (has no administration in time range)  lidocaine  (PF) (XYLOCAINE ) 1 % injection 5 mL (has no administration in time range)  lidocaine -prilocaine  (EMLA ) cream 1 Application (has no administration in time range)  heparin  injection 1,000 Units (has no administration in time range)  anticoagulant sodium citrate  solution 5 mL (has no administration in time range)  alteplase  (CATHFLO ACTIVASE ) injection 2 mg (has no administration in time range)    ED Course/ Medical Decision Making/ A&P Clinical Course as of 05/22/23 0201  Sun May 21, 2023  2207 ESRD HD T, [place], Sat [CG]  2207 As per cardiology follow-up on day of discharge, continue aspirin  81 Mg daily + Plavix  75 Mg daily for more than 12 months given extensive PCI of the LAD and RCA.  Patient also on Eliquis  anticoagulation. Echo shows ICM with LVEF of 40%. [CG]  2207 Admitted 3/31 to 4/5 for NSTEMI. [CG]  2207  Admitted 4/17 to 4/22 for UGI bleed. Discontinued eliquis  at this time, sent home on vancomycin  taper for c diff colitis  [CG]    Clinical Course User Index [CG] Adel Aden, PA-C    Medical Decision Making Amount and/or Complexity of Data Reviewed Labs: ordered. Radiology: ordered.   63 year old presents for evaluation.  Please see HPI for further details.  On exam the patient is afebrile, nontachycardic.  His lung sounds have rales throughout, he is on 4 L of oxygen  via nasal cannulae with 100 and oxygen  saturation.  Abdomen soft and compressible.  Neurological examination is at baseline.  Overall nontoxic in appearance, reassuring vital signs, stable.  Suspect volume overload.  Will assess with CBC, BMP, BNP, troponin x 2, i-STAT VBG, chest x-ray, EKG.  CBC without leukocytosis, baseline hemoglobin open 0.4.  Metabolic panel with creatinine 7.39, GFR 8, BUN 24.  Sodium, potassium within normal limits.  PT/INR within normal limits.  I-STAT VBG with venous pH 7.4, venous O2 55, venous PCO2 33.  Troponin 47, delta pending.  BNP elevated to 2179.  Chest x-ray shows developing pleural effusions.  At this time, feel patient needs admission for dialysis in the setting of volume overload.  Have discussed with Dr. Jonah Negus of nephrology who agrees to dialyze patient while admitted.  Discussed with hospitalist Dr. [facility] Lavender who agrees to admit patient.  Stable on admission.  Final Clinical Impression(s) / ED Diagnoses Final diagnoses:  Hypervolemia, unspecified hypervolemia type    Rx / DC Orders ED Discharge Orders     None  Adel Aden, PA-C 05/22/23 0201    Jerilynn Montenegro, MD 05/22/23 [phone]

## 2023-05-21 NOTE — ED Provider Notes (Incomplete)
 [place] EMERGENCY DEPARTMENT AT [facility] Provider Note   CSN: [id number] Arrival date & time: 05/21/23  2201     History  Chief Complaint  Patient presents with  . Chest Pain    Anthony Bullock is a 63 y.o. male with an extensive medical history to include ESRD on HD Tuesday Thursday Saturday, PVD, diabetes, anemia, bilateral BKA, hypertension, atrial fibrillation, hyperlipidemia.  Patient recently admitted to hospital secondary to upper GI bleed with C. difficile colitis, NSTEMI.  Patient presents to ED for evaluation of chest pain and shortness of breath.  States that he was at home in normal state of health, stating he felt very well.  Reports that he attempted to swallow medication, pill, and it became lodged in his throat.  He states that it took him about 1 minute to clear the pill and swallow it.  States that after this occurred he started having  midsternal chest pain associated with shortness of breath.  He is unsure of an exertional component to this.  He took a nitroglycerin  which took his chest pain from a 9 out of 10 to a 3 out of 10 and called EMS.  EMS apparently put patient on CPAP secondary to patient request.  Patient was transitioned off of CPAP upon arrival to the ED and has an oxygen  saturation 100% on nasal cannula.  Patient is requesting to go back on BiPAP.  He denies any diarrhea at home.  Denies any nausea, vomiting, fevers.  Denies lightheadedness, dizziness or weakness.  Denies any home oxygen  use.  Reports that he was last dialyzed on Saturday, yesterday.  States he is tapering off vancomycin  when she was placed on for C. difficile colitis.   Chest Pain Associated symptoms: shortness of breath        Home Medications Prior to Admission medications   Medication Sig Start Date End Date Taking? Authorizing Provider  acetaminophen  (TYLENOL ) 325 MG tablet Take 1-2 tablets (325-650 mg total) by mouth every 4 (four) hours as needed for mild pain.  11/29/19   Love, Renay Carota, PA-C  amiodarone  (PACERONE ) 200 MG tablet Take 1 tablet (200 mg total) by mouth 2 (two) times daily for 20 days, THEN 1 tablet (200 mg total) daily. 05/02/23 06/21/23  Jodeane Mulligan, DO  aspirin  EC 81 MG tablet Take 1 tablet (81 mg total) by mouth daily. Swallow whole. 04/15/23   Hongalgi, Anand D, MD  atorvastatin  (LIPITOR ) 80 MG tablet TAKE 1 TABLET BY MOUTH AT BEDTIME Patient taking differently: Take 80 mg by mouth daily. 06/24/22   Burchette, Marijean Shouts, MD  carvedilol  (COREG ) 12.5 MG tablet Take 1 tablet (12.5 mg total) by mouth 2 (two) times daily. 01/25/23   Burchette, Marijean Shouts, MD  cinacalcet  (SENSIPAR ) 30 MG tablet Take 1 tablet (30 mg total) by mouth Every Tuesday,Thursday,and Saturday with dialysis. 11/30/19   Love, Renay Carota, PA-C  clopidogrel  (PLAVIX ) 75 MG tablet Take 1 tablet (75 mg total) by mouth daily with breakfast. 04/16/23   Hongalgi, Thomasene Flemings, MD  Continuous Glucose Receiver (FREESTYLE LIBRE 3 READER) DEVI Use to check blood glucose TID 08/11/22   Burchette, Marijean Shouts, MD  Continuous Glucose Sensor (FREESTYLE LIBRE 3 PLUS SENSOR) MISC USE AS DIRECTED TO CHECK GLUCOSE DAILY. CHANGE EVERY 15 DAYS 05/12/23   Burchette, Marijean Shouts, MD  gabapentin  (NEURONTIN ) 300 MG capsule Take 1 capsule (300 mg total) by mouth at bedtime. 04/15/23   Hongalgi, Thomasene Flemings, MD  glucose blood test strip  Check 1 time daily. E11.9 One Touch Ultra Blue Test Strips 03/10/14   Burchette, Marijean Shouts, MD  HUMALOG  KWIKPEN 200 UNIT/ML KwikPen INJECT A MAXIMUM OF 28 UNITS SUBCUTANEOUSLY TWICE DAILY WITH LUNCH AND SUPPER PER SLIDING SCALE. APPOINTMENT REQUIRED FOR FUTURE REFILLS 11/11/22   Burchette, Marijean Shouts, MD  Insulin  Pen Needle (BD PEN NEEDLE NANO U/F) 32G X 4 MM MISC USE 1 PEN NEEDLE SUBCUTANEOUSLY WITH INSULIN  4 TIMES DAILY 12/04/19   Burchette, Marijean Shouts, MD  LANTUS  SOLOSTAR 100 UNIT/ML Solostar Pen INJECT 12 UNITS SUBCUTANEOUSLY AT BEDTIME Patient taking differently: Inject 14 Units into the skin at bedtime.  07/15/22   Burchette, Marijean Shouts, MD  midodrine  (PROAMATINE ) 10 MG tablet Take 1 tablet (10 mg total) by mouth every 8 (eight) hours. 05/02/23   Jodeane Mulligan, DO  multivitamin (RENA-VIT) TABS tablet Take 1 tablet by mouth once daily Patient taking differently: Take 1 tablet by mouth daily. 07/06/21   Burchette, Marijean Shouts, MD  nitroGLYCERIN  (NITROSTAT ) 0.4 MG SL tablet Place 1 tablet (0.4 mg total) under the tongue every 5 (five) minutes as needed for chest pain. 04/21/23   Burchette, Marijean Shouts, MD  Olopatadine  HCl 0.2 % SOLN Place 1 drop into both eyes daily as needed (for allergies).     [provider]  pantoprazole  (PROTONIX ) 40 MG tablet Take 1 tablet (40 mg total) by mouth every 12 (twelve) hours. 05/02/23   Jodeane Mulligan, DO  sevelamer  (RENAGEL ) 800 MG tablet Take 1,600 mg by mouth 3 (three) times daily with meals.    [provider]  vancomycin  (VANCOCIN ) 125 MG capsule Take 1 capsule (125 mg total) by mouth 4 (four) times daily for 14 days, THEN 1 capsule (125 mg total) 2 (two) times daily for 7 days, THEN 1 capsule (125 mg total) daily for 7 days, THEN 1 capsule (125 mg total) every other day for 28 days, THEN 1 capsule (125 mg total) every 3 (three) days for 28 days. 05/02/23 07/25/23  Jodeane Mulligan, DO  VENTOLIN  HFA 108 (90 Base) MCG/ACT inhaler INHALE 1 TO 2 PUFFS BY MOUTH EVERY 6 HOURS AS NEEDED FOR WHEEZING FOR SHORTNESS OF BREATH 05/05/23   Burchette, Marijean Shouts, MD      Allergies    Epoetin  alfa, Ferumoxytol , and Morphine sulfate    Review of Systems   Review of Systems  Respiratory:  Positive for shortness of breath.   Cardiovascular:  Positive for chest pain.  All other systems reviewed and are negative.   Physical Exam Updated Vital Signs BP (!) 148/60   Pulse 81   Temp 97.7 F (36.5 C) (Temporal)   Resp 15   Ht 5\' 10"  (1.778 m)   Wt 88.5 kg   SpO2 100%   BMI 27.98 kg/m  Physical Exam Vitals and nursing note reviewed.  Constitutional:      General: He  is not in acute distress.    Appearance: He is well-developed.  HENT:     Head: Normocephalic and atraumatic.  Eyes:     Conjunctiva/sclera: Conjunctivae normal.  Cardiovascular:     Rate and Rhythm: Normal rate and regular rhythm.     Heart sounds: No murmur heard. Pulmonary:     Effort: Pulmonary effort is normal. No respiratory distress.     Breath sounds: Rales present.  Abdominal:     Palpations: Abdomen is soft.     Tenderness: There is no abdominal tenderness.  Musculoskeletal:  General: No swelling.     Cervical back: Neck supple.     Comments: Bilateral BKA     Right Lower Extremity: Right leg is amputated below knee.     Left Lower Extremity: Left leg is amputated below knee.  Skin:    General: Skin is warm and dry.     Capillary Refill: Capillary refill takes less than 2 seconds.  Neurological:     Mental Status: He is alert and oriented to person, place, and time. Mental status is at baseline.  Psychiatric:        Mood and Affect: Mood normal.     ED Results / Procedures / Treatments   Labs (all labs ordered are listed, but only abnormal results are displayed) Labs Reviewed  BASIC METABOLIC PANEL WITH GFR  CBC  PROTIME-INR  TROPONIN I (HIGH SENSITIVITY)    EKG EKG Interpretation Date/Time:  Sunday May 21 2023 22:08:35 EDT Ventricular Rate:  84 PR Interval:  196 QRS Duration:  137 QT Interval:  452 QTC Calculation: 535 R Axis:   -31  Text Interpretation: Sinus rhythm Left bundle branch block afib no longer present. otherwise ST/T changes similar to last month Confirmed by Jerilynn Montenegro [phone]) on 05/21/2023 10:10:59 PM  Radiology No results found.  Procedures Procedures   Medications Ordered in ED Medications - No data to display  ED Course/ Medical Decision Making/ A&P Clinical Course as of 05/21/23 2218  Sun May 21, 2023  2207 ESRD HD T, [place], Sat [CG]  2207 As per cardiology follow-up on day of discharge, continue aspirin  81 Mg  daily + Plavix  75 Mg daily for more than 12 months given extensive PCI of the LAD and RCA.  Patient also on Eliquis  anticoagulation. Echo shows ICM with LVEF of 40%. [CG]  2207 Admitted 3/31 to 4/5 for NSTEMI. [CG]  2207 Admitted 4/17 to 4/22 for UGI bleed. Discontinued eliquis  at this time, sent home on vancomycin  taper for c diff colitis  [CG]    Clinical Course User Index [CG] Adel Aden, PA-C    Medical Decision Making Amount and/or Complexity of Data Reviewed Labs: ordered. Radiology: ordered.   63 year old presents for evaluation.  Please see HPI for further details.    Final Clinical Impression(s) / ED Diagnoses Final diagnoses:  None    Rx / DC Orders ED Discharge Orders     None

## 2023-05-21 NOTE — ED Notes (Signed)
 Attempts to interrogate Anthony Bullock unsuccessful.   Pt says batteries have died in his device as it has been over 5 years since being changed.

## 2023-05-22 ENCOUNTER — Encounter (HOSPITAL_COMMUNITY): Payer: Self-pay | Admitting: Internal Medicine

## 2023-05-22 DIAGNOSIS — Z992 Dependence on renal dialysis: Secondary | ICD-10-CM

## 2023-05-22 DIAGNOSIS — R0602 Shortness of breath: Secondary | ICD-10-CM | POA: Diagnosis not present

## 2023-05-22 DIAGNOSIS — E8779 Other fluid overload: Secondary | ICD-10-CM | POA: Diagnosis not present

## 2023-05-22 DIAGNOSIS — E785 Hyperlipidemia, unspecified: Secondary | ICD-10-CM | POA: Diagnosis not present

## 2023-05-22 DIAGNOSIS — N186 End stage renal disease: Secondary | ICD-10-CM | POA: Diagnosis not present

## 2023-05-22 DIAGNOSIS — E1169 Type 2 diabetes mellitus with other specified complication: Secondary | ICD-10-CM

## 2023-05-22 DIAGNOSIS — J810 Acute pulmonary edema: Secondary | ICD-10-CM

## 2023-05-22 DIAGNOSIS — I214 Non-ST elevation (NSTEMI) myocardial infarction: Secondary | ICD-10-CM | POA: Diagnosis not present

## 2023-05-22 DIAGNOSIS — I480 Paroxysmal atrial fibrillation: Secondary | ICD-10-CM

## 2023-05-22 DIAGNOSIS — D631 Anemia in chronic kidney disease: Secondary | ICD-10-CM | POA: Diagnosis not present

## 2023-05-22 DIAGNOSIS — I132 Hypertensive heart and chronic kidney disease with heart failure and with stage 5 chronic kidney disease, or end stage renal disease: Secondary | ICD-10-CM | POA: Diagnosis not present

## 2023-05-22 DIAGNOSIS — I10 Essential (primary) hypertension: Secondary | ICD-10-CM

## 2023-05-22 DIAGNOSIS — I5022 Chronic systolic (congestive) heart failure: Secondary | ICD-10-CM | POA: Diagnosis not present

## 2023-05-22 LAB — BASIC METABOLIC PANEL WITH GFR
Anion gap: 11 (ref 5–15)
BUN: 10 mg/dL (ref 8–23)
CO2: 26 mmol/L (ref 22–32)
Calcium: 8.2 mg/dL — ABNORMAL LOW (ref 8.9–10.3)
Chloride: 98 mmol/L (ref 98–111)
Creatinine, Ser: 3.97 mg/dL — ABNORMAL HIGH (ref 0.61–1.24)
GFR, Estimated: 16 mL/min — ABNORMAL LOW (ref >60)
Glucose, Bld: 106 mg/dL — ABNORMAL HIGH (ref 70–99)
Potassium: 3.3 mmol/L — ABNORMAL LOW (ref 3.5–5.1)
Sodium: 135 mmol/L (ref 135–145)

## 2023-05-22 LAB — CBC
HCT: 36.9 % — ABNORMAL LOW (ref 39.0–52.0)
Hemoglobin: 11.5 g/dL — ABNORMAL LOW (ref 13.0–17.0)
MCH: 29 pg (ref 26.0–34.0)
MCHC: 31.2 g/dL (ref 30.0–36.0)
MCV: 93.2 fL (ref 80.0–100.0)
Platelets: 203 10*3/uL (ref 150–400)
RBC: 3.96 MIL/uL — ABNORMAL LOW (ref 4.22–5.81)
RDW: 14.7 % (ref 11.5–15.5)
WBC: 12.6 10*3/uL — ABNORMAL HIGH (ref 4.0–10.5)
nRBC: 0 % (ref 0.0–0.2)

## 2023-05-22 LAB — CREATININE, SERUM
Creatinine, Ser: 4.03 mg/dL — ABNORMAL HIGH (ref 0.61–1.24)
GFR, Estimated: 16 mL/min — ABNORMAL LOW (ref >60)

## 2023-05-22 LAB — GLUCOSE, CAPILLARY
Glucose-Capillary: 113 mg/dL — ABNORMAL HIGH (ref 70–99)
Glucose-Capillary: 186 mg/dL — ABNORMAL HIGH (ref 70–99)

## 2023-05-22 LAB — HEPATITIS B SURFACE ANTIGEN: Hepatitis B Surface Ag: NONREACTIVE

## 2023-05-22 LAB — TROPONIN I (HIGH SENSITIVITY): Troponin I (High Sensitivity): 51 ng/L — ABNORMAL HIGH (ref <18)

## 2023-05-22 MED ORDER — AMIODARONE HCL 200 MG PO TABS: 200.0000 mg | ORAL_TABLET | Freq: Two times a day (BID) | ORAL | Status: DC

## 2023-05-22 MED ORDER — SEVELAMER CARBONATE 800 MG PO TABS
1600.0000 mg | ORAL_TABLET | Freq: Three times a day (TID) | ORAL | Status: DC
  Administered 2023-05-22 (×2): 1600 mg via ORAL
  Filled 2023-05-22 (×2): qty 2

## 2023-05-22 MED ORDER — CINACALCET HCL 30 MG PO TABS: 30.0000 mg | ORAL_TABLET | ORAL | Status: DC

## 2023-05-22 MED ORDER — CHLORHEXIDINE GLUCONATE CLOTH 2 % EX PADS: 6.0000 | MEDICATED_PAD | Freq: Every day | CUTANEOUS | Status: DC

## 2023-05-22 MED ORDER — AMIODARONE HCL 200 MG PO TABS: 200.0000 mg | ORAL_TABLET | Freq: Every day | ORAL | Status: DC

## 2023-05-22 MED ORDER — AMIODARONE HCL 200 MG PO TABS
200.0000 mg | ORAL_TABLET | Freq: Every day | ORAL | Status: DC
Start: 2023-05-23 — End: 2023-05-22

## 2023-05-22 MED ORDER — APIXABAN 5 MG PO TABS
5.0000 mg | ORAL_TABLET | Freq: Two times a day (BID) | ORAL | Status: DC
  Administered 2023-05-22: 5 mg via ORAL
  Filled 2023-05-22: qty 1

## 2023-05-22 MED ORDER — RENA-VITE PO TABS
1.0000 | ORAL_TABLET | Freq: Every day | ORAL | Status: DC
  Administered 2023-05-22: 1 via ORAL
  Filled 2023-05-22: qty 1

## 2023-05-22 MED ORDER — ATORVASTATIN CALCIUM 40 MG PO TABS
80.0000 mg | ORAL_TABLET | Freq: Every day | ORAL | Status: DC
  Administered 2023-05-22: 80 mg via ORAL
  Filled 2023-05-22: qty 2

## 2023-05-22 MED ORDER — ALTEPLASE 2 MG IJ SOLR: 2.0000 mg | Freq: Once | INTRAMUSCULAR | Status: DC | PRN

## 2023-05-22 MED ORDER — VANCOMYCIN HCL 125 MG PO CAPS: 125.0000 mg | ORAL_CAPSULE | ORAL | Status: DC

## 2023-05-22 MED ORDER — LIDOCAINE HCL (PF) 1 % IJ SOLN: 5.0000 mL | INTRAMUSCULAR | Status: DC | PRN

## 2023-05-22 MED ORDER — INSULIN GLARGINE-YFGN 100 UNIT/ML ~~LOC~~ SOLN
10.0000 [IU] | Freq: Every day | SUBCUTANEOUS | Status: DC
Start: 2023-05-22 — End: 2023-05-22
  Filled 2023-05-22: qty 0.1

## 2023-05-22 MED ORDER — VANCOMYCIN HCL 125 MG PO CAPS
125.0000 mg | ORAL_CAPSULE | Freq: Two times a day (BID) | ORAL | Status: DC
  Administered 2023-05-22: 125 mg via ORAL
  Filled 2023-05-22: qty 1

## 2023-05-22 MED ORDER — PENTAFLUOROPROP-TETRAFLUOROETH EX AERO: 1.0000 | INHALATION_SPRAY | CUTANEOUS | Status: DC | PRN

## 2023-05-22 MED ORDER — GABAPENTIN 300 MG PO CAPS: 300.0000 mg | ORAL_CAPSULE | Freq: Every day | ORAL | Status: DC

## 2023-05-22 MED ORDER — HEPARIN SODIUM (PORCINE) 5000 UNIT/ML IJ SOLN: 5000.0000 [IU] | Freq: Three times a day (TID) | INTRAMUSCULAR | Status: DC

## 2023-05-22 MED ORDER — HEPARIN SODIUM (PORCINE) 1000 UNIT/ML DIALYSIS: 1000.0000 [IU] | INTRAMUSCULAR | Status: DC | PRN

## 2023-05-22 MED ORDER — INSULIN ASPART 100 UNIT/ML IJ SOLN
0.0000 [IU] | Freq: Three times a day (TID) | INTRAMUSCULAR | Status: DC
  Administered 2023-05-22: 2 [IU] via SUBCUTANEOUS

## 2023-05-22 MED ORDER — AMIODARONE HCL 200 MG PO TABS
200.0000 mg | ORAL_TABLET | Freq: Two times a day (BID) | ORAL | Status: DC
  Administered 2023-05-22: 200 mg via ORAL
  Filled 2023-05-22: qty 1

## 2023-05-22 MED ORDER — NITROGLYCERIN 0.4 MG SL SUBL: 0.4000 mg | SUBLINGUAL_TABLET | SUBLINGUAL | Status: DC | PRN

## 2023-05-22 MED ORDER — HEPARIN SODIUM (PORCINE) 1000 UNIT/ML IJ SOLN
INTRAMUSCULAR | Status: AC
  Filled 2023-05-22: qty 4

## 2023-05-22 MED ORDER — APIXABAN 5 MG PO TABS: 5.0000 mg | ORAL_TABLET | Freq: Two times a day (BID) | ORAL | 1 refills | Status: DC

## 2023-05-22 MED ORDER — PANTOPRAZOLE SODIUM 40 MG PO TBEC
40.0000 mg | DELAYED_RELEASE_TABLET | Freq: Two times a day (BID) | ORAL | Status: DC
  Administered 2023-05-22: 40 mg via ORAL
  Filled 2023-05-22: qty 1

## 2023-05-22 MED ORDER — CARVEDILOL 12.5 MG PO TABS
12.5000 mg | ORAL_TABLET | Freq: Two times a day (BID) | ORAL | Status: DC
  Administered 2023-05-22: 12.5 mg via ORAL
  Filled 2023-05-22: qty 1

## 2023-05-22 MED ORDER — MIDODRINE HCL 5 MG PO TABS
10.0000 mg | ORAL_TABLET | Freq: Three times a day (TID) | ORAL | Status: DC
  Administered 2023-05-22 (×2): 10 mg via ORAL
  Filled 2023-05-22 (×2): qty 2

## 2023-05-22 MED ORDER — CLOPIDOGREL BISULFATE 75 MG PO TABS: 75.0000 mg | ORAL_TABLET | Freq: Every day | ORAL | 2 refills | Status: DC

## 2023-05-22 MED ORDER — CLOPIDOGREL BISULFATE 75 MG PO TABS
75.0000 mg | ORAL_TABLET | Freq: Every day | ORAL | Status: DC
  Administered 2023-05-22: 75 mg via ORAL
  Filled 2023-05-22: qty 1

## 2023-05-22 MED ORDER — LIDOCAINE-PRILOCAINE 2.5-2.5 % EX CREA: 1.0000 | TOPICAL_CREAM | CUTANEOUS | Status: DC | PRN

## 2023-05-22 MED ORDER — ANTICOAGULANT SODIUM CITRATE 4% (200MG/5ML) IV SOLN: 5.0000 mL | Status: DC | PRN

## 2023-05-22 MED ORDER — VANCOMYCIN HCL 125 MG PO CAPS: 125.0000 mg | ORAL_CAPSULE | Freq: Every day | ORAL | Status: DC

## 2023-05-22 NOTE — TOC Transition Note (Signed)
 Transition of Care [facility]) - Discharge Note   Patient Details  Name: Anthony Bullock MRN: [id number] Date of Birth: [birth year]  Transition of Care [facility]) CM/SW Contact:  Eusebio High, RN Phone Number: 05/22/2023, 1:34 PM   Clinical Narrative:     Patient will DC to home today. No TOC needs identified. Patient will follow up as directed on AVS    Family to transport           Patient Goals and CMS Choice            Discharge Placement                       Discharge Plan and Services Additional resources added to the After Visit Summary for                                       Social Drivers of Health (SDOH) Interventions SDOH Screenings   Food Insecurity: No Food Insecurity (04/27/2023)  Housing: Low Risk  (04/27/2023)  Transportation Needs: No Transportation Needs (04/27/2023)  Utilities: Not At Risk (04/27/2023)  Alcohol  Screen: Low Risk  (02/03/2023)  Depression (PHQ2-9): Low Risk  (02/03/2023)  Financial Resource Strain: Low Risk  (02/03/2023)  Physical Activity: Inactive (02/03/2023)  Social Connections: Socially Integrated (04/27/2023)  Stress: No Stress Concern Present (02/03/2023)  Tobacco Use: Medium Risk (05/22/2023)  Health Literacy: Adequate Health Literacy (02/03/2023)     Readmission Risk Interventions    05/01/2023    1:22 PM 04/12/2023    3:14 PM 03/08/2023   10:44 AM  Readmission Risk Prevention Plan  Transportation Screening Complete Complete Complete  PCP or Specialist Appt within 3-5 Days   Complete  HRI or Home Care Consult   Complete  Social Work Consult for Recovery Care Planning/Counseling   Complete  Palliative Care Screening   Not Applicable  Medication Review Oceanographer) Referral to Pharmacy Referral to Pharmacy Complete  PCP or Specialist appointment within 3-5 days of discharge Complete    HRI or Home Care Consult Complete Complete   SW Recovery Care/Counseling Consult Complete Complete   Palliative Care Screening  Not Applicable Not Applicable   Skilled Nursing Facility Not Applicable Not Applicable

## 2023-05-22 NOTE — Progress Notes (Signed)
   05/22/23 0043  BiPAP/CPAP/SIPAP  Reason BIPAP/CPAP not in use Other(comment) (pt requested to come off of the Bipap VSS at this time RT will followup if needed.)   Pt resting comfortably placed on 2L [place]

## 2023-05-22 NOTE — Progress Notes (Signed)
 PROGRESS NOTE        PATIENT DETAILS Name: Anthony Bullock Age: 63 y.o. Sex: male Date of Birth: 1960/07/26 Admit Date: 05/21/2023 Admitting Physician Angelene Kelly, MD [id number], Marijean Shouts, MD  Brief Summary: Patient is a 63 y.o.  male with history of ESRD on HD TTS, PAD-s/p bilateral BKA, PAF, CAD-s/p recent PCI to LAD/RCA on 4/2-recent hospitalization for upper GI bleed secondary to duodenal ulcer (Eliquis  held until outpatient follow-up)-presented to the hospital with shortness of breath-patient was found to have acute hypoxic respiratory failure secondary to pulmonary edema requiring BiPAP and underwent emergent hemodialysis.  Significant events: 5/11>> admit to [facility]  Significant studies: 5/11>> admit to [facility]  Significant microbiology data: None  Procedures: None  Consults: Nephrology Cardiology.  Subjective: Seen earlier this morning-just came back from dialysis-feels much better-on room air!.    Objective: Vitals: Blood pressure (!) 141/52, pulse 76, temperature 98.6 F (37 C), temperature source Oral, resp. rate 13, height 5\' 10"  (1.778 m), weight 88.5 kg, SpO2 94%.   Exam: Gen Exam:Alert awake-not in any distress HEENT:atraumatic, normocephalic Chest: B/L clear to auscultation anteriorly CVS:S1S2 regular Abdomen:soft non tender, non distended Extremities: Bilateral BKA Neurology: Non focal Skin: no rash  Pertinent Labs/Radiology:    Latest Ref Rng & Units 05/21/2023   10:33 PM 05/21/2023   10:26 PM 05/12/2023    2:56 PM  CBC  WBC 4.0 - 10.5 K/uL  9.6  9.5   Hemoglobin 13.0 - 17.0 g/dL 81.1  91.4  78.2   Hematocrit 39.0 - 52.0 % 36.0  36.6  33.2   Platelets 150 - 400 K/uL  207  251.0     Lab Results  Component Value Date   NA 137 05/21/2023   K 3.7 05/21/2023   CL 100 05/21/2023   CO2 23 05/21/2023      Assessment/Plan: Acute hypoxic respiratory failure secondary to acute pulmonary edema in the setting of dietary  indiscretion-history of ESRD/chronic HFrEF Required BiPAP on initial presentation-underwent HD-significantly improved-now on room air Acknowledges dietary indiscretion-excess fluid/salt over the weekend-given Mother's Day weekend.  Chest pain Hx of CAD-s/p recent PCI to LAD/RCA 04/12/23 Suspect secondary to pulm edema-suspect this is related to ACS. Troponin minimally elevated-flat Currently pain-free Continue Plavix -not on DAPT due to UGI bleed Remains on statin/beta-blocker Await cardiology input.  PAF Sinus rhythm Eliquis  held-since last admit due to GI bleed-plan was to follow-up with cardiology outpatient to reassess if Eliquis  can be resumed.   Suspect this can be resumed-given stability of hemoglobin-and no further GI bleeding.  Await cardiology input.  Chronic HFrEF (EF 40% by TTE on 04/11/2023) Euvolemic Volume removal with HD.  ESRD on HD TTS Nephrology following Await official/formal consultation  Recent UGI bleeding secondary duodenal ulcer PPI No further bleeding-brown stools per patient. Hb stable  History of C. difficile On prolonged oral Vanco taper  DM-2 (A1c 6.6 on 3/10) CBG stable Semglee  10 units daily+ SSI  PAD -s/p bilateral BKA Prosthesis in place On antiplatelet/statin/beta-blocker  Code status:   Code Status: Full Code   DVT Prophylaxis: heparin  injection 5,000 Units Start: 05/22/23 1400   Family Communication: None at bedside   Disposition Plan: Status is: Observation The patient will require care spanning > 2 midnights and should be moved to inpatient because: Severity of illness   Planned Discharge Destination:Home   Diet: Diet Order  Diet renal/carb modified with fluid restriction Diet-HS Snack? Nothing; Fluid restriction: 1200 mL Fluid; Room service appropriate? Yes; Fluid consistency: Thin  Diet effective now                     Antimicrobial agents: Anti-infectives (From admission, onward)    Start      Dose/Rate Route Frequency Ordered Stop   06/27/23 1000  vancomycin  (VANCOCIN ) capsule 125 mg       Placed in "Followed by" Linked Group   125 mg Oral Every 3 DAYS 05/22/23 0537 07/27/23 0959   05/30/23 1000  vancomycin  (VANCOCIN ) capsule 125 mg       Placed in "Followed by" Linked Group   125 mg Oral Every other day 05/22/23 0537 06/27/23 0959   05/23/23 1000  vancomycin  (VANCOCIN ) capsule 125 mg       Placed in "Followed by" Linked Group   125 mg Oral Daily 05/22/23 0537 05/30/23 0959   05/22/23 1000  vancomycin  (VANCOCIN ) capsule 125 mg       Placed in "Followed by" Linked Group   125 mg Oral 2 times daily 05/22/23 0537 05/23/23 0959        MEDICATIONS: Scheduled Meds:  amiodarone   200 mg Oral BID   Followed by   Cecily Cohen ON 05/23/2023] amiodarone   200 mg Oral Daily   atorvastatin   80 mg Oral Daily   carvedilol   12.5 mg Oral BID WC   Chlorhexidine  Gluconate Cloth  6 each Topical Q0600   [START ON 05/23/2023] cinacalcet   30 mg Oral Q T,Th,Sa-HD   clopidogrel   75 mg Oral Q breakfast   gabapentin   300 mg Oral QHS   heparin   5,000 Units Subcutaneous Q8H   insulin  aspart  0-9 Units Subcutaneous TID WC   insulin  glargine-yfgn  10 Units Subcutaneous QHS   midodrine   10 mg Oral Q8H   multivitamin  1 tablet Oral Daily   pantoprazole   40 mg Oral Q12H   sevelamer  carbonate  1,600 mg Oral TID WC   vancomycin   125 mg Oral BID   Followed by   Cecily Cohen ON 05/23/2023] vancomycin   125 mg Oral Daily   Followed by   Cecily Cohen ON 05/30/2023] vancomycin   125 mg Oral QODAY   Followed by   Cecily Cohen ON 06/27/2023] vancomycin   125 mg Oral Q3 days   Continuous Infusions: PRN Meds:.nitroGLYCERIN    I have personally reviewed following labs and imaging studies  LABORATORY DATA: CBC: Recent Labs  Lab 05/21/23 2226 05/21/23 2233  WBC 9.6  --   HGB 11.4* 12.2*  HCT 36.6* 36.0*  MCV 95.3  --   PLT 207  --     Basic Metabolic Panel: Recent Labs  Lab 05/21/23 2226 05/21/23 2233  NA 136 137  K  3.7 3.7  CL 100  --   CO2 23  --   GLUCOSE 152*  --   BUN 24*  --   CREATININE 7.39*  --   CALCIUM  7.8*  --     GFR: Estimated Creatinine Clearance: 11.8 mL/min (A) (by C-G formula based on SCr of 7.39 mg/dL (H)).  Liver Function Tests: No results for input(s): "AST", "ALT", "ALKPHOS", "BILITOT", "PROT", "ALBUMIN " in the last 168 hours. No results for input(s): "LIPASE", "AMYLASE" in the last 168 hours. No results for input(s): "AMMONIA" in the last 168 hours.  Coagulation Profile: Recent Labs  Lab 05/21/23 2226  INR 1.1    Cardiac Enzymes: No results for input(s): "CKTOTAL", "CKMB", "CKMBINDEX", "  TROPONINI" in the last 168 hours.  BNP (last 3 results) No results for input(s): "PROBNP" in the last 8760 hours.  Lipid Profile: No results for input(s): "CHOL", "HDL", "LDLCALC", "TRIG", "CHOLHDL", "LDLDIRECT" in the last 72 hours.  Thyroid  Function Tests: No results for input(s): "TSH", "T4TOTAL", "FREET4", "T3FREE", "THYROIDAB" in the last 72 hours.  Anemia Panel: No results for input(s): "VITAMINB12", "FOLATE", "FERRITIN", "TIBC", "IRON", "RETICCTPCT" in the last 72 hours.  Urine analysis:    Component Value Date/Time   COLORURINE yellow 09/28/2009 1317   APPEARANCEUR Clear 09/28/2009 1317   LABSPEC 1.010 09/28/2009 1317   PHURINE 5.0 09/28/2009 1317   HGBUR 2+ 09/28/2009 1317   BILIRUBINUR negative 03/17/2016 1648   PROTEINUR trace 03/17/2016 1648   UROBILINOGEN 0.2 03/17/2016 1648   UROBILINOGEN 0.2 09/28/2009 1317   NITRITE negative 03/17/2016 1648   NITRITE negative 09/28/2009 1317   LEUKOCYTESUR small (1+) (A) 03/17/2016 1648    Sepsis Labs: Lactic Acid, Venous    Component Value Date/Time   LATICACIDVEN 1.3 04/30/2023 0440    MICROBIOLOGY: No results found for this or any previous visit (from the past 240 hours).  RADIOLOGY STUDIES/RESULTS: DG Chest Port 1 View Result Date: 05/21/2023 CLINICAL DATA:  Shortness of breath and chest pain. History  of end-stage renal disease. EXAM: PORTABLE CHEST 1 VIEW COMPARISON:  04/30/2023 FINDINGS: Stable heart size and mediastinal contours. Cardiomegaly is stable. Right-sided dialysis catheter in place. Subcutaneous pacer. Moderate right pleural effusion is increased from prior exam. May be a small left pleural effusion. Vascular congestion. No pneumothorax. IMPRESSION: 1. Moderate right pleural effusion, increased from prior exam. Possible small left pleural effusion. 2. Stable cardiomegaly.  Vascular congestion. Electronically Signed   By: Chadwick Colonel M.D.   On: 05/21/2023 23:08     LOS: 0 days   Kimberly Penna, MD  [facility]    To contact the attending provider between 7A-7P or the covering provider during after hours 7P-7A, please log into the web site [email or web address] and access using universal [place] password for that web site. If you do not have the password, please call the hospital operator.  05/22/2023, 9:15 AM

## 2023-05-22 NOTE — Progress Notes (Signed)
   05/22/23 0110  BiPAP/CPAP/SIPAP  $ Non-Invasive Ventilator  Non-Invasive Vent Subsequent  BiPAP/CPAP/SIPAP Pt Type Adult  BiPAP/CPAP/SIPAP SERVO  Mask Type Full face mask  Dentures removed? Not applicable  Mask Size Small  Set Rate 0 breaths/min  Respiratory Rate 20 breaths/min  IPAP 13 cmH20  EPAP 5 cmH2O  Pressure Support 8 cmH20  PEEP 5 cmH20  FiO2 (%) 40 %  Flow Rate 0 lpm  Minute Ventilation 11.1  Leak 15  Peak Inspiratory Pressure (PIP) 13  Tidal Volume (Vt) 950  Patient Home Machine No  Patient Home Mask No  Patient Home Tubing No  Auto Titrate No  Press High Alarm 25 cmH2O   Pt placed back on Bipap per pt request. No distress noted.

## 2023-05-22 NOTE — Progress Notes (Signed)
 Received patient in bed to unit.  Alert and oriented.  Informed consent signed and in chart.   TX duration:b 3.5 hours  Patient tolerated well.  Transported back to the room  Alert, without acute distress.  Hand-off given to patient's nurse.   Access used: catheter Access issues: none  Total UF removed: 2500 ml Medication(s) given: none Post HD VS: 112/54 Post HD weight: unable to obtain.   05/22/23 0639  Vitals  Temp 98.1 F (36.7 C)  Temp Source Oral  BP (!) 112/54  MAP (mmHg) 72  BP Location Right Arm  BP Method Automatic  Patient Position (if appropriate) Lying  Pulse Rate 72  ECG Heart Rate 72  Resp (!) 9  Oxygen  Therapy  SpO2 95 %  O2 Device Room Air  Patient Activity (if Appropriate) In bed  Pulse Oximetry Type Continuous  During Treatment Monitoring  Blood Flow Rate (mL/min) 0 mL/min  Arterial Pressure (mmHg) -158.17 mmHg  Venous Pressure (mmHg) -347.46 mmHg  TMP (mmHg) 12.73 mmHg  Ultrafiltration Rate (mL/min) 977 mL/min  Dialysate Flow Rate (mL/min) 300 ml/min  Dialysate Potassium Concentration 3  Dialysate Calcium  Concentration 2.5  Duration of HD Treatment -hour(s) 3.5 hour(s)  Cumulative Fluid Removed (mL) per Treatment  2500.2  Post Treatment  Dialyzer Clearance Lightly streaked  Hemodialysis Intake (mL) 0 mL  Liters Processed 84  Fluid Removed (mL) 2500 mL  Tolerated HD Treatment Yes  Post-Hemodialysis Comments HD tx completed as expected, tolerated well.  pt is stable.  AVG/AVF Arterial Site Held (minutes) 0 minutes  AVG/AVF Venous Site Held (minutes) 0 minutes  Note  Patient Observations pt is bed resting.  Hemodialysis Catheter Right Internal jugular Double-lumen;Permanent  Placement Date/Time: 12/28/15 1732   Time Out: Correct patient;Correct site;Correct procedure  Maximum sterile barrier precautions: Hand hygiene;Sterile gown;Sterile gloves;Large sterile sheet;Mask;Cap  Site Prep: Chlorhexidine   Local Anesthetic: Inje...  Site  Condition No complications  Blue Lumen Status Flushed;Heparin  locked;Dead end cap in place  Red Lumen Status Flushed;Heparin  locked;Dead end cap in place  Purple Lumen Status N/A  Catheter fill solution Heparin  1000 units/ml  Catheter fill volume (Arterial) 1.9 cc  Catheter fill volume (Venous) 1.9  Dressing Type Transparent  Dressing Status Clean, Dry, Intact;Antimicrobial disc/dressing in place  Interventions New dressing  Drainage Description None  Post treatment catheter status Capped and Clamped      [facility]

## 2023-05-22 NOTE — Procedures (Signed)
 I was present at the procedure, reviewed the HD regimen and made appropriate changes.   Larry Poag MD  [facility] 05/22/2023, 2:34 AM

## 2023-05-22 NOTE — Progress Notes (Signed)
 Pt seen in room. Had 2.5 L UF w/ HD overnight. He feels much better today. He is losing body wt because of dietary changes (eating healthier) and also appetite is not great. If have d/w his outpt unit that we should lower the dry wt by 2kg to 82.5kg, and then use protocol for further dry wt reduction of 0.5mg  w/ each of the next 2-3 sessions.   Larry Poag  MD  [facility] 05/22/2023, 12:39 PM  Recent Labs  Lab 05/21/23 2226 05/21/23 2233 05/22/23 0807  HGB 11.4* 12.2* 11.5*  CALCIUM  7.8*  --  8.2*  CREATININE 7.39*  --  3.97*  4.03*  K 3.7 3.7 3.3*

## 2023-05-22 NOTE — Progress Notes (Signed)
 Pt transported via Bipap to dialysis, w/o any complications RT and RN at bedside. VSS throughout.

## 2023-05-22 NOTE — Discharge Summary (Signed)
 PATIENT DETAILS Name: Anthony Bullock Age: 63 y.o. Sex: male Date of Birth: 1960/04/05 MRN: [id number]. Admitting Physician: Angelene Kelly, MD WUJ:[id number], Marijean Shouts, MD  Admit Date: 05/21/2023 Discharge date: 05/22/2023  Recommendations for Outpatient Follow-up:  Follow up with PCP in 1-2 weeks Please obtain CMP/CBC in one week  Admitted From:  Home  Disposition: Home   Discharge Condition: good  CODE STATUS:   Code Status: Full Code   Diet recommendation:  Diet Order             Diet renal/carb modified with fluid restriction Diet-HS Snack? Nothing; Fluid restriction: 1200 mL Fluid; Room service appropriate? Yes; Fluid consistency: Thin  Diet effective now           Diet - low sodium heart healthy                    Brief Summary: Patient is a 63 y.o.  male with history of ESRD on HD TTS, PAD-s/p bilateral BKA, PAF, CAD-s/p recent PCI to LAD/RCA on 4/2-recent hospitalization for upper GI bleed secondary to duodenal ulcer (Eliquis  held until outpatient follow-up)-presented to the hospital with shortness of breath-patient was found to have acute hypoxic respiratory failure secondary to pulmonary edema requiring BiPAP and underwent emergent hemodialysis.   Significant events: 5/11>> admit to [facility]   Significant studies: 5/11>> admit to [facility]   Significant microbiology data: None   Procedures: None   Consults: Nephrology Cardiology.  Brief Hospital Course: Acute hypoxic respiratory failure secondary to acute pulmonary edema in the setting of dietary indiscretion-history of ESRD/chronic HFrEF Required BiPAP on initial presentation-underwent HD-significantly improved-now on room air Acknowledges dietary indiscretion-excess fluid/salt over the weekend-given Mother's Day weekend. Discussed with nephrology-Dr. Marvina Slough recommends that we discharge this patient-patient will follow-up with his hemodialysis center tomorrow for his usual HD.   Chest pain Hx  of CAD-s/p recent PCI to LAD/RCA 04/12/23 Suspect secondary to pulm edema-suspect this is related to ACS. Troponin minimally elevated-flat Currently pain-free Continue Plavix -not on DAPT due to UGI bleed Remains on statin/beta-blocker This MD discussed case with Dr. Jyl Or Elwood-cardiology on-call over the phone-continue current medications-follow-up with cardiology as scheduled on 5/30.   PAF Sinus rhythm Eliquis  held-since last admit due to GI bleed-plan was to follow-up with cardiology outpatient to reassess if Eliquis  can be resumed.   Suspect this can be resumed-given stability of hemoglobin-and no further GI bleeding.  Reviewed prior GI note-during his most recent hospitalization-subsequently spoke with cardiology on-call-Dr. Rayburn Cagey go ahead and resume Eliquis .   Chronic HFrEF (EF 40% by TTE on 04/11/2023) Euvolemic Volume removal with HD.   ESRD on HD TTS Nephrology following-see above-plan is to discharge patient today as he is clinically improved for him to follow-up at his outpatient hemodialysis center tomorrow for his usual hemodialysis.   Recent UGI bleeding secondary duodenal ulcer PPI No further bleeding-brown stools per patient. Hb stable   History of C. difficile On prolonged oral Vanco taper No diarrhea per patient   DM-2 (A1c 6.6 on 3/10) CBG stable Resume usual outpatient insulin  regimen.   PAD -s/p bilateral BKA Prosthesis in place On antiplatelet/statin/beta-blocker  Discharge Diagnoses:  Principal Problem:   Acute pulmonary edema (HCC) Active Problems:   Atrial fibrillation (HCC)   Essential hypertension   Type 2 diabetes mellitus with hyperlipidemia (HCC)   COPD (chronic obstructive pulmonary disease) (HCC)   Chronic systolic congestive heart failure (HCC)   CAD (coronary artery disease)   ESRD on dialysis (HCC)  S/P bilateral below knee amputation (HCC)   GERD without esophagitis   S/P primary angioplasty with coronary  stent   Discharge Instructions:  Activity:  As tolerated with Full fall precautions use walker/cane & assistance as needed  Discharge Instructions     (HEART FAILURE PATIENTS) Call MD:  Anytime you have any of the following symptoms: 1) 3 pound weight gain in 24 hours or 5 pounds in 1 week 2) shortness of breath, with or without a dry hacking cough 3) swelling in the hands, feet or stomach 4) if you have to sleep on extra pillows at night in order to breathe.   Complete by: As directed    Call MD for:  difficulty breathing, headache or visual disturbances   Complete by: As directed    Diet - low sodium heart healthy   Complete by: As directed    Discharge instructions   Complete by: As directed    Follow with Primary MD  Marquetta Sit, MD in 1-2 weeks  Please get a complete blood count and chemistry panel checked by your Primary MD at your next visit, and again as instructed by your Primary MD.  Get Medicines reviewed and adjusted: Please take all your medications with you for your next visit with your Primary MD  Laboratory/radiological data: Please request your Primary MD to go over all hospital tests and procedure/radiological results at the follow up, please ask your Primary MD to get all Hospital records sent to his/her office.  In some cases, they will be blood work, cultures and biopsy results pending at the time of your discharge. Please request that your primary care M.D. follows up on these results.  Also Note the following: If you experience worsening of your admission symptoms, develop shortness of breath, life threatening emergency, suicidal or homicidal thoughts you must seek medical attention immediately by calling 911 or calling your MD immediately  if symptoms less severe.  You must read complete instructions/literature along with all the possible adverse reactions/side effects for all the Medicines you take and that have been prescribed to you. Take any new  Medicines after you have completely understood and accpet all the possible adverse reactions/side effects.   Do not drive when taking Pain medications or sleeping medications (Benzodaizepines)  Do not take more than prescribed Pain, Sleep and Anxiety Medications. It is not advisable to combine anxiety,sleep and pain medications without talking with your primary care practitioner  Special Instructions: If you have smoked or chewed Tobacco  in the last 2 yrs please stop smoking, stop any regular Alcohol   and or any Recreational drug use.  Wear Seat belts while driving.  Please note: You were cared for by a hospitalist during your hospital stay. Once you are discharged, your primary care physician will handle any further medical issues. Please note that NO REFILLS for any discharge medications will be authorized once you are discharged, as it is imperative that you return to your primary care physician (or establish a relationship with a primary care physician if you do not have one) for your post hospital discharge needs so that they can reassess your need for medications and monitor your lab values.   Increase activity slowly   Complete by: As directed       Allergies as of 05/22/2023       Reactions   Epoetin  Alfa Other (See Comments)   Unknown    Ferumoxytol  Other (See Comments)   Unknown    Morphine Sulfate Rash,  Other (See Comments)   Itches all over, red spots        Medication List     STOP taking these medications    aspirin  EC 81 MG tablet       TAKE these medications    acetaminophen  325 MG tablet Commonly known as: TYLENOL  Take 1-2 tablets (325-650 mg total) by mouth every 4 (four) hours as needed for mild pain.   amiodarone  200 MG tablet Commonly known as: PACERONE  Take 1 tablet (200 mg total) by mouth 2 (two) times daily for 20 days, THEN 1 tablet (200 mg total) daily. Start taking on: May 02, 2023   apixaban  5 MG Tabs tablet Commonly known as:  ELIQUIS  Take 1 tablet (5 mg total) by mouth 2 (two) times daily.   atorvastatin  80 MG tablet Commonly known as: LIPITOR  TAKE 1 TABLET BY MOUTH AT BEDTIME What changed:  how much to take how to take this when to take this additional instructions   BD Pen Needle Nano U/F 32G X 4 MM Misc Generic drug: Insulin  Pen Needle USE 1 PEN NEEDLE SUBCUTANEOUSLY WITH INSULIN  4 TIMES DAILY   carvedilol  12.5 MG tablet Commonly known as: COREG  Take 1 tablet (12.5 mg total) by mouth 2 (two) times daily.   cinacalcet  30 MG tablet Commonly known as: SENSIPAR  Take 1 tablet (30 mg total) by mouth Every Tuesday,Thursday,and Saturday with dialysis.   clopidogrel  75 MG tablet Commonly known as: PLAVIX  Take 1 tablet (75 mg total) by mouth daily with breakfast.   FreeStyle Libre 3 Plus Sensor Misc USE AS DIRECTED TO CHECK GLUCOSE DAILY. CHANGE EVERY 15 DAYS   FreeStyle Libre 3 Reader Devi Use to check blood glucose TID   gabapentin  300 MG capsule Commonly known as: NEURONTIN  Take 1 capsule (300 mg total) by mouth at bedtime.   glucose blood test strip Check 1 time daily. E11.9 One Touch Ultra Blue Test Strips   HumaLOG  KwikPen 200 UNIT/ML KwikPen Generic drug: insulin  lispro INJECT A MAXIMUM OF 28 UNITS SUBCUTANEOUSLY TWICE DAILY WITH LUNCH AND SUPPER PER SLIDING SCALE. APPOINTMENT REQUIRED FOR FUTURE REFILLS What changed: See the new instructions.   Lantus  SoloStar 100 UNIT/ML Solostar Pen Generic drug: insulin  glargine INJECT 12 UNITS SUBCUTANEOUSLY AT BEDTIME What changed: See the new instructions.   midodrine  10 MG tablet Commonly known as: PROAMATINE  Take 1 tablet (10 mg total) by mouth every 8 (eight) hours.   multivitamin Tabs tablet Take 1 tablet by mouth once daily   nitroGLYCERIN  0.4 MG SL tablet Commonly known as: NITROSTAT  Place 1 tablet (0.4 mg total) under the tongue every 5 (five) minutes as needed for chest pain.   Olopatadine  HCl 0.2 % Soln Place 1 drop into both  eyes daily as needed (for allergies).   pantoprazole  40 MG tablet Commonly known as: PROTONIX  Take 1 tablet (40 mg total) by mouth every 12 (twelve) hours.   sevelamer  800 MG tablet Commonly known as: RENAGEL  Take 1,600 mg by mouth 3 (three) times daily with meals.   vancomycin  125 MG capsule Commonly known as: VANCOCIN  Take 1 capsule (125 mg total) by mouth 4 (four) times daily for 14 days, THEN 1 capsule (125 mg total) 2 (two) times daily for 7 days, THEN 1 capsule (125 mg total) daily for 7 days, THEN 1 capsule (125 mg total) every other day for 28 days, THEN 1 capsule (125 mg total) every 3 (three) days for 28 days. Start taking on: May 02, 2023   Ventolin  [facility]  108 (90 Base) MCG/ACT inhaler Generic drug: albuterol  INHALE 1 TO 2 PUFFS BY MOUTH EVERY 6 HOURS AS NEEDED FOR WHEEZING FOR SHORTNESS OF BREATH        Follow-up Information     Lasalle Pointer, NP Follow up on 06/09/2023.   Specialty: Cardiology Why: appt at 2 pm Contact information: [address] [place] [address] [phone]         Marquetta Sit, MD. Schedule an appointment as soon as possible for a visit in 1 week(s).   Specialty: Family Medicine Contact information: [address] [place] [address] [phone]                Allergies  Allergen Reactions   Epoetin  Alfa Other (See Comments)    Unknown    Ferumoxytol  Other (See Comments)    Unknown    Morphine Sulfate Rash and Other (See Comments)    Itches all over, red spots     Other Procedures/Studies: DG Chest Port 1 View Result Date: 05/21/2023 CLINICAL DATA:  Shortness of breath and chest pain. History of end-stage renal disease. EXAM: PORTABLE CHEST 1 VIEW COMPARISON:  04/30/2023 FINDINGS: Stable heart size and mediastinal contours. Cardiomegaly is stable. Right-sided dialysis catheter in place. Subcutaneous pacer. Moderate right pleural effusion is increased from prior exam. May be a small left pleural  effusion. Vascular congestion. No pneumothorax. IMPRESSION: 1. Moderate right pleural effusion, increased from prior exam. Possible small left pleural effusion. 2. Stable cardiomegaly.  Vascular congestion. Electronically Signed   By: Chadwick Colonel M.D.   On: 05/21/2023 23:08   DG CHEST PORT 1 VIEW Result Date: 04/30/2023 CLINICAL DATA:  Rapid response for hypotension and shortness of breath. EXAM: PORTABLE CHEST 1 VIEW COMPARISON:  04/10/2023 FINDINGS: Right IJ CVC tip in the right atrium. Cardiomegaly. Left chest wall ICD. Aortic atherosclerotic calcification. Pulmonary vascular congestion. Left lower lung airspace opacities. Small bilateral pleural effusions. No pneumothorax. No displaced rib fractures. IMPRESSION: 1. Cardiomegaly with pulmonary vascular congestion and small bilateral pleural effusions. 2. Left lower lung airspace opacities, atelectasis versus pneumonia. Electronically Signed   By: Rozell Cornet M.D.   On: 04/30/2023 01:13     TODAY-DAY OF DISCHARGE:  Subjective:   Anthony Bullock today has no headache,no chest abdominal pain,no new weakness tingling or numbness, feels much better wants to go home today.  Objective:   Blood pressure (!) 141/52, pulse 76, temperature 98.6 F (37 C), temperature source Oral, resp. rate 13, height 5\' 10"  (1.778 m), weight 88.5 kg, SpO2 94%.  Intake/Output Summary (Last 24 hours) at 05/22/2023 1307 Last data filed at 05/22/2023 7253 Gross per 24 hour  Intake --  Output 2500 ml  Net -2500 ml   Filed Weights   05/21/23 2217  Weight: 88.5 kg    Exam: Awake Alert, Oriented *3, No new F.N deficits, Normal affect [place].AT,PERRAL Supple Neck,No JVD, No cervical lymphadenopathy appriciated.  Symmetrical Chest wall movement, Good air movement bilaterally, CTAB RRR,No Gallops,Rubs or new Murmurs, No Parasternal Heave +ve B.Sounds, Abd Soft, Non tender, No organomegaly appriciated, No rebound -guarding or rigidity. No Cyanosis, Clubbing, No new  Rash or bruise   PERTINENT RADIOLOGIC STUDIES: DG Chest Port 1 View Result Date: 05/21/2023 CLINICAL DATA:  Shortness of breath and chest pain. History of end-stage renal disease. EXAM: PORTABLE CHEST 1 VIEW COMPARISON:  04/30/2023 FINDINGS: Stable heart size and mediastinal contours. Cardiomegaly is stable. Right-sided dialysis catheter in place. Subcutaneous pacer. Moderate right pleural effusion is increased from prior  exam. May be a small left pleural effusion. Vascular congestion. No pneumothorax. IMPRESSION: 1. Moderate right pleural effusion, increased from prior exam. Possible small left pleural effusion. 2. Stable cardiomegaly.  Vascular congestion. Electronically Signed   By: Chadwick Colonel M.D.   On: 05/21/2023 23:08     PERTINENT LAB RESULTS: CBC: Recent Labs    05/21/23 2226 05/21/23 2233 05/22/23 0807  WBC 9.6  --  12.6*  HGB 11.4* 12.2* 11.5*  HCT 36.6* 36.0* 36.9*  PLT 207  --  203   CMET CMP     Component Value Date/Time   NA 135 05/22/2023 0807   K 3.3 (L) 05/22/2023 0807   CL 98 05/22/2023 0807   CO2 26 05/22/2023 0807   GLUCOSE 106 (H) 05/22/2023 0807   BUN 10 05/22/2023 0807   BUN 75 (A) 11/07/2017 0000   CREATININE 4.03 (H) 05/22/2023 0807   CREATININE 3.97 (H) 05/22/2023 0807   CALCIUM  8.2 (L) 05/22/2023 0807   CALCIUM  13.6 (HH) 06/04/2015 1300   PROT 5.9 (L) 04/30/2023 0936   ALBUMIN  2.1 (L) 05/02/2023 0913   AST 47 (H) 04/30/2023 0936   ALT 25 04/30/2023 0936   ALKPHOS 71 04/30/2023 0936   BILITOT 0.8 04/30/2023 0936   GFR 7.22 (LL) 09/02/2022 1143   GFRNONAA 16 (L) 05/22/2023 0807   GFRNONAA 16 (L) 05/22/2023 0807    GFR Estimated Creatinine Clearance: 21.6 mL/min (A) (by C-G formula based on SCr of 4.03 mg/dL (H)). No results for input(s): "LIPASE", "AMYLASE" in the last 72 hours. No results for input(s): "CKTOTAL", "CKMB", "CKMBINDEX", "TROPONINI" in the last 72 hours. Invalid input(s): "POCBNP" No results for input(s): "DDIMER" in the  last 72 hours. No results for input(s): "HGBA1C" in the last 72 hours. No results for input(s): "CHOL", "HDL", "LDLCALC", "TRIG", "CHOLHDL", "LDLDIRECT" in the last 72 hours. No results for input(s): "TSH", "T4TOTAL", "T3FREE", "THYROIDAB" in the last 72 hours.  Invalid input(s): "FREET3" No results for input(s): "VITAMINB12", "FOLATE", "FERRITIN", "TIBC", "IRON", "RETICCTPCT" in the last 72 hours. Coags: Recent Labs    05/21/23 2226  INR 1.1   Microbiology: No results found for this or any previous visit (from the past 240 hours).  FURTHER DISCHARGE INSTRUCTIONS:  Get Medicines reviewed and adjusted: Please take all your medications with you for your next visit with your Primary MD  Laboratory/radiological data: Please request your Primary MD to go over all hospital tests and procedure/radiological results at the follow up, please ask your Primary MD to get all Hospital records sent to his/her office.  In some cases, they will be blood work, cultures and biopsy results pending at the time of your discharge. Please request that your primary care M.D. goes through all the records of your hospital data and follows up on these results.  Also Note the following: If you experience worsening of your admission symptoms, develop shortness of breath, life threatening emergency, suicidal or homicidal thoughts you must seek medical attention immediately by calling 911 or calling your MD immediately  if symptoms less severe.  You must read complete instructions/literature along with all the possible adverse reactions/side effects for all the Medicines you take and that have been prescribed to you. Take any new Medicines after you have completely understood and accpet all the possible adverse reactions/side effects.   Do not drive when taking Pain medications or sleeping medications (Benzodaizepines)  Do not take more than prescribed Pain, Sleep and Anxiety Medications. It is not advisable to  combine anxiety,sleep and  pain medications without talking with your primary care practitioner  Special Instructions: If you have smoked or chewed Tobacco  in the last 2 yrs please stop smoking, stop any regular Alcohol   and or any Recreational drug use.  Wear Seat belts while driving.  Please note: You were cared for by a hospitalist during your hospital stay. Once you are discharged, your primary care physician will handle any further medical issues. Please note that NO REFILLS for any discharge medications will be authorized once you are discharged, as it is imperative that you return to your primary care physician (or establish a relationship with a primary care physician if you do not have one) for your post hospital discharge needs so that they can reassess your need for medications and monitor your lab values.  Total Time spent coordinating discharge including counseling, education and face to face time equals greater than 30 minutes.  SignedKimberly Penna 05/22/2023 1:07 PM

## 2023-05-22 NOTE — Consult Note (Signed)
 Renal Service Consult Note [facility]  CANDICE MISNER 05/22/2023 Lynae Sandifer, MD Requesting Physician: Dr. Barba Bonnet.   Reason for Consult: ESRD pt w/ resp distress HPI: The patient is a 63 y.o. year-old w/ PMH as below who presented c/o CP and SOB. He choked on a pill he was trying to swallow, then this triggered chest pain and SOB. He tried sl ntg and called EMS. EMS put him on CPAP. Has not missed any dialysis (TTS schedule). In ED CXR showed bilat worsening effusions and vasc congestion w/ early pulm edema. BP 148/60, HR 84, RR 10-15.  Na 136, K 3.7, bun 24 and creat 7.4, BNP 2179, Hb 11.4. Pt will be admitted. We are asked to see for dialysis.    Pt seen in ED room. Main c/o was SOB, better now after CPAP/ bipap. No fevers, chills, no CP.    ROS - denies CP, no joint pain, no HA, no blurry vision, no rash, no diarrhea, no nausea/ vomiting  PMH: Sp AICD HFrEF COPD DM2 ESRD on HD HL HTN NICM  Past Surgical History  Past Surgical History:  Procedure Laterality Date   ABDOMINAL AORTOGRAM W/LOWER EXTREMITY N/A 03/30/2016   Procedure: Abdominal Aortogram w/Lower Extremity;  Surgeon: Dannis Dy, MD;  Location: [facility] INVASIVE CV LAB;  Service: Cardiovascular;  Laterality: N/A;   AMPUTATION Right 04/26/2016   Procedure: Right Below Knee Amputation;  Surgeon: Timothy Ford, MD;  Location[facility] OR;  Service: Orthopedics;  Laterality: Right;   AMPUTATION Left 08/21/2019   Procedure: LEFT FOOT 5TH RAY AMPUTATION;  Surgeon: Timothy Ford, MD;  Location: [facility] OR;  Service: Orthopedics;  Laterality: Left;   AMPUTATION Left 11/13/2019   Procedure: LEFT BELOW KNEE AMPUTATION;  Surgeon: Timothy Ford, MD;  Location: [facility] OR;  Service: Orthopedics;  Laterality: Left;   AV FISTULA PLACEMENT Right 09/08/2015   Procedure: INSERTION OF 4-57mm x 45cm  ARTERIOVENOUS (AV) GORE-TEX GRAFT RIGHT UPPER  ARM;  Surgeon: Dannis Dy, MD;  Location: [facility] OR;  Service: Vascular;   Laterality: Right;   AV FISTULA PLACEMENT Left 01/14/2016   Procedure: CREATION OF LEFT UPPER ARM ARTERIOVENOUS FISTULA;  Surgeon: Dannis Dy, MD;  Location: [facility] OR;  Service: Vascular;  Laterality: Left;   BASCILIC VEIN TRANSPOSITION Right 08/22/2014   Procedure: RIGHT UPPER ARM BASCILIC VEIN TRANSPOSITION;  Surgeon: Dannis Dy, MD;  Location: [facility] OR;  Service: Vascular;  Laterality: Right;   BELOW KNEE LEG AMPUTATION Right 04/26/2016   BIOPSY OF SKIN SUBCUTANEOUS TISSUE AND/OR MUCOUS MEMBRANE  04/28/2023   Procedure: BIOPSY, SKIN, SUBCUTANEOUS TISSUE, OR MUCOUS MEMBRANE;  Surgeon: Sergio Dandy, MD;  Location: [facility] ENDOSCOPY;  Service: Gastroenterology;;   CARDIAC CATHETERIZATION     CARDIAC DEFIBRILLATOR PLACEMENT  06/27/2013   Sub Q       BY DR Rodolfo Clan   CATARACT EXTRACTION W/PHACO Right 08/06/2018   Procedure: CATARACT EXTRACTION PHACO AND INTRAOCULAR LENS PLACEMENT (IOC);  Surgeon: Tarri Farm, MD;  Location: [facility] ORS;  Service: Ophthalmology;  Laterality: Right;  CDE: 4.06   CATARACT EXTRACTION W/PHACO Left 08/20/2018   Procedure: CATARACT EXTRACTION PHACO AND INTRAOCULAR LENS PLACEMENT (IOC);  Surgeon: Tarri Farm, MD;  Location: [facility] ORS;  Service: Ophthalmology;  Laterality: Left;  CDE: 6.76   COLONOSCOPY WITH PROPOFOL  N/A 07/22/2015   Procedure: COLONOSCOPY WITH PROPOFOL ;  Surgeon: Albertina Hugger, MD;  Location: [facility] ENDOSCOPY;  Service: Gastroenterology;  Laterality: N/A;   CORONARY LITHOTRIPSY N/A 04/14/2023  Procedure: CORONARY LITHOTRIPSY;  Surgeon: Arleen Lacer, MD;  Location: [facility] INVASIVE CV LAB;  Service: Cardiovascular;  Laterality: N/A;  RCA   CORONARY STENT INTERVENTION N/A 04/12/2023   Procedure: CORONARY STENT INTERVENTION;  Surgeon: Swaziland, Peter M, MD;  Location: [facility] INVASIVE CV LAB;  Service: Cardiovascular;  Laterality: N/A;   CORONARY STENT INTERVENTION N/A 04/14/2023   Procedure: CORONARY STENT INTERVENTION;  Surgeon: Arleen Lacer, MD;  Location:  [facility] INVASIVE CV LAB;  Service: Cardiovascular;  Laterality: N/A;   ESOPHAGOGASTRODUODENOSCOPY N/A 04/28/2023   Procedure: EGD (ESOPHAGOGASTRODUODENOSCOPY);  Surgeon: Nandigam, Kavitha V, MD;  Location: [facility] ENDOSCOPY;  Service: Gastroenterology;  Laterality: N/A;   FEMORAL-POPLITEAL BYPASS GRAFT Right 03/31/2016   Procedure: BYPASS GRAFT FEMORAL-POPLITEAL ARTERY USING RIGHT GREATER SAPHENOUS NONREVERSED VEIN;  Surgeon: Dannis Dy, MD;  Location: [facility] OR;  Service: Vascular;  Laterality: Right;   HERNIA REPAIR     I & D EXTREMITY Right 03/31/2016   Procedure: IRRIGATION AND DEBRIDEMENT FOOT;  Surgeon: Dannis Dy, MD;  Location: [facility] OR;  Service: Vascular;  Laterality: Right;   IMPLANTABLE CARDIOVERTER DEFIBRILLATOR IMPLANT N/A 06/27/2013   Procedure: SUB Q ICD;  Surgeon: Verona Goodwill, MD;  Location: [facility] CATH LAB;  Service: Cardiovascular;  Laterality: N/A;   INTRAOPERATIVE ARTERIOGRAM Right 03/31/2016   Procedure: INTRA OPERATIVE ARTERIOGRAM;  Surgeon: Dannis Dy, MD;  Location: [facility] OR;  Service: Vascular;  Laterality: Right;   IR GENERIC HISTORICAL Right 11/30/2015   IR THROMBECTOMY AV FISTULA W/THROMBOLYSIS/PTA INC/SHUNT/IMG RIGHT 11/30/2015 Erica Hau, MD [facility]   IR GENERIC HISTORICAL  11/30/2015   IR US  GUIDE VASC ACCESS RIGHT 11/30/2015 Erica Hau, MD [facility]   IR GENERIC HISTORICAL Right 12/15/2015   IR THROMBECTOMY AV FISTULA W/THROMBOLYSIS/PTA/STENT INC/SHUNT/IMG RT 12/15/2015 Marland Silvas, MD [facility]   IR GENERIC HISTORICAL  12/15/2015   IR US  GUIDE VASC ACCESS RIGHT 12/15/2015 Marland Silvas, MD [facility]   IR GENERIC HISTORICAL  12/28/2015   IR FLUORO GUIDE CV LINE RIGHT 12/28/2015 Artice Last, MD [facility]   IR GENERIC HISTORICAL  12/28/2015   IR US  GUIDE VASC ACCESS RIGHT 12/28/2015 Artice Last, MD [facility]   LEFT A ND RIGHT HEART CATH  01/30/2013   DR Dasie Epps   LEFT AND RIGHT HEART CATHETERIZATION WITH CORONARY  ANGIOGRAM N/A 01/30/2013   Procedure: LEFT AND RIGHT HEART CATHETERIZATION WITH CORONARY ANGIOGRAM;  Surgeon: Mardell Shade, MD;  Location: [facility] CATH LAB;  Service: Cardiovascular;  Laterality: N/A;   LEFT HEART CATH AND CORONARY ANGIOGRAPHY N/A 04/12/2023   Procedure: LEFT HEART CATH AND CORONARY ANGIOGRAPHY;  Surgeon: Swaziland, Peter M, MD;  Location: [facility] INVASIVE CV LAB;  Service: Cardiovascular;  Laterality: N/A;   PERIPHERAL VASCULAR CATHETERIZATION Right 01/26/2015   Procedure: A/V Fistulagram;  Surgeon: Dannis Dy, MD;  Location: [facility] INVASIVE CV LAB;  Service: Cardiovascular;  Laterality: Right;   reapea urethral surgery for recurrent obstruction  2011   TOTAL KNEE ARTHROPLASTY Right 2007   VEIN HARVEST Right 03/31/2016   Procedure: RIGHT GREATER SAPHENOUS VEIN HARVEST;  Surgeon: Dannis Dy, MD;  Location: [facility] OR;  Service: Vascular;  Laterality: Right;   Family History  Family History  Problem Relation Age of Onset   Bladder Cancer Mother    Alcohol  abuse Father    Melanoma Father    Stroke Maternal Grandmother    Heart Problems Maternal Grandmother        unknown   Diabetes Maternal Grandmother    Heart  disease Maternal Grandfather    Prostate cancer Maternal Grandfather    Social History  reports that he quit smoking about 13 years ago. His smoking use included cigarettes. He started smoking about 45 years ago. He has a 64 pack-year smoking history. He has never used smokeless tobacco. He reports that he does not drink alcohol  and does not use drugs. Allergies  Allergies  Allergen Reactions   Epoetin  Alfa Other (See Comments)    Unknown    Ferumoxytol  Other (See Comments)    Unknown    Morphine Sulfate Rash and Other (See Comments)    Itches all over, red spots   Home medications Prior to Admission medications   Medication Sig Start Date End Date Taking? Authorizing Provider  acetaminophen  (TYLENOL ) 325 MG tablet Take 1-2 tablets (325-650 mg total) by  mouth every 4 (four) hours as needed for mild pain. 11/29/19  Yes Love, Renay Carota, PA-C  amiodarone  (PACERONE ) 200 MG tablet Take 1 tablet (200 mg total) by mouth 2 (two) times daily for 20 days, THEN 1 tablet (200 mg total) daily. 05/02/23 06/21/23 Yes Jodeane Mulligan, DO  atorvastatin  (LIPITOR ) 80 MG tablet TAKE 1 TABLET BY MOUTH AT BEDTIME Patient taking differently: Take 80 mg by mouth daily. 06/24/22  Yes Burchette, Marijean Shouts, MD  carvedilol  (COREG ) 12.5 MG tablet Take 1 tablet (12.5 mg total) by mouth 2 (two) times daily. 01/25/23  Yes Burchette, Marijean Shouts, MD  clopidogrel  (PLAVIX ) 75 MG tablet Take 1 tablet (75 mg total) by mouth daily with breakfast. 04/16/23  Yes Hongalgi, Anand D, MD  gabapentin  (NEURONTIN ) 300 MG capsule Take 1 capsule (300 mg total) by mouth at bedtime. 04/15/23  Yes Hongalgi, Anand D, MD  HUMALOG  KWIKPEN 200 UNIT/ML KwikPen INJECT A MAXIMUM OF 28 UNITS SUBCUTANEOUSLY TWICE DAILY WITH LUNCH AND SUPPER PER SLIDING SCALE. APPOINTMENT REQUIRED FOR FUTURE REFILLS Patient taking differently: Inject 7 Units into the skin 3 (three) times daily with meals. Per sliding scale 11/11/22  Yes Burchette, Marijean Shouts, MD  LANTUS  SOLOSTAR 100 UNIT/ML Solostar Pen INJECT 12 UNITS SUBCUTANEOUSLY AT BEDTIME Patient taking differently: Inject 14 Units into the skin at bedtime. 07/15/22  Yes Burchette, Marijean Shouts, MD  midodrine  (PROAMATINE ) 10 MG tablet Take 1 tablet (10 mg total) by mouth every 8 (eight) hours. 05/02/23  Yes Jodeane Mulligan, DO  multivitamin (RENA-VIT) TABS tablet Take 1 tablet by mouth once daily Patient taking differently: Take 1 tablet by mouth daily. 07/06/21  Yes Burchette, Marijean Shouts, MD  nitroGLYCERIN  (NITROSTAT ) 0.4 MG SL tablet Place 1 tablet (0.4 mg total) under the tongue every 5 (five) minutes as needed for chest pain. 04/21/23  Yes Burchette, Marijean Shouts, MD  Olopatadine  HCl 0.2 % SOLN Place 1 drop into both eyes daily as needed (for allergies).    Yes [provider]  pantoprazole   (PROTONIX ) 40 MG tablet Take 1 tablet (40 mg total) by mouth every 12 (twelve) hours. 05/02/23  Yes Jodeane Mulligan, DO  sevelamer  (RENAGEL ) 800 MG tablet Take 1,600 mg by mouth 3 (three) times daily with meals.   Yes [provider]  vancomycin  (VANCOCIN ) 125 MG capsule Take 1 capsule (125 mg total) by mouth 4 (four) times daily for 14 days, THEN 1 capsule (125 mg total) 2 (two) times daily for 7 days, THEN 1 capsule (125 mg total) daily for 7 days, THEN 1 capsule (125 mg total) every other day for 28 days, THEN 1 capsule (125 mg total) every 3 (  three) days for 28 days. 05/02/23 07/25/23 Yes Jodeane Mulligan, DO  VENTOLIN  HFA 108 (90 Base) MCG/ACT inhaler INHALE 1 TO 2 PUFFS BY MOUTH EVERY 6 HOURS AS NEEDED FOR WHEEZING FOR SHORTNESS OF BREATH 05/05/23  Yes Burchette, Marijean Shouts, MD  aspirin  EC 81 MG tablet Take 1 tablet (81 mg total) by mouth daily. Swallow whole. Patient not taking: Reported on 05/22/2023 04/15/23   Hongalgi, Anand D, MD  cinacalcet  (SENSIPAR ) 30 MG tablet Take 1 tablet (30 mg total) by mouth Every Tuesday,Thursday,and Saturday with dialysis. 11/30/19   Love, Renay Carota, PA-C  Continuous Glucose Receiver (FREESTYLE LIBRE 3 READER) DEVI Use to check blood glucose TID 08/11/22   Burchette, Marijean Shouts, MD  Continuous Glucose Sensor (FREESTYLE LIBRE 3 PLUS SENSOR) MISC USE AS DIRECTED TO CHECK GLUCOSE DAILY. CHANGE EVERY 15 DAYS 05/12/23   Burchette, Marijean Shouts, MD  glucose blood test strip Check 1 time daily. E11.9 One Touch Ultra Blue Test Strips 03/10/14   Burchette, Marijean Shouts, MD  Insulin  Pen Needle (BD PEN NEEDLE NANO U/F) 32G X 4 MM MISC USE 1 PEN NEEDLE SUBCUTANEOUSLY WITH INSULIN  4 TIMES DAILY 12/04/19   Marquetta Sit, MD     Vitals:   05/21/23 2209 05/21/23 2217  BP: (!) 148/60   Pulse: 81   Resp: 15   Temp: 97.7 F (36.5 C)   TempSrc: Temporal   SpO2: 100%   Weight:  88.5 kg  Height:  5\' 10"  (1.778 m)   Exam Gen alert, no distress, 2 L [place] O2 No rash, cyanosis or  gangrene Sclera anicteric, throat clear  No jvd or bruits Chest clear bilat to bases, no rales/ wheezing RRR no MRG Abd soft ntnd no mass or ascites +bs GU nl male MS no joint effusions or deformity Ext bilat amputee, no sig edema Neuro is alert, Ox 3 , nf    RIJ TDC intact    OP HD: [facility] TTS From 04/14/23 --> 3.5h  B400   85.5kg  1K bath  TDC   Heparin  none    Assessment/ Plan: Resp distress: w/ vol overload and pulm edema/ vasc congestion by CXR. Plan is for acute iHD upstairs when a spot opens up.  ESRD: on HD TTS. HD as above.  HTN: bp's are good, cont home meds Volume: not grossly overloaded, wet on CXR Anemia of esrd: Hb 11, no esa needs Secondary hyperparathyroidism: Ca in range, cont binders w/ meals.  NICM/ HFrEF      Larry Poag  MD [facility] 05/22/2023, 1:07 AM  Recent Labs  Lab 05/21/23 2226 05/21/23 2233  HGB 11.4* 12.2*  CALCIUM  7.8*  --   CREATININE 7.39*  --   K 3.7 3.7   Inpatient medications:

## 2023-05-22 NOTE — H&P (Signed)
 History and Physical    DEVIAN TATA Bullock:[id number] DOB: [birth year] DOA: 05/21/2023  Patient coming from: Home.  Chief Complaint: Shortness of breath.  HPI: Anthony Bullock is a 63 y.o. male with history significant for hypertension, dyslipidemia, ESRD on hemodialysis, paroxysmal atrial fibrillation, peripheral vascular disease status post bilateral BKA and chronic anemia who recently admitted for non-ST elevation MI underwent stenting of LAD and RCA on 04/12/2023 and 04/14/23 subsequent to which patient was again admitted on 04/27/2023 for GI bleed and EGD showed superficial duodenal ulcers and was started on PPI.  Patient is on Eliquis  which was held and was discharged on Plavix  only.  Patient yesterday at around dinnertime was eating felt choked and started developing substernal chest pressure following which became short of breath.  Chest pain resolved after sublingual nitroglycerin .  Patient remained persistently short of breath and was brought to the ER and was placed on BiPAP.  ED Course: In the ER chest x-ray shows bilateral pleural effusion.  Nephrology was consulted for urgent dialysis.  EKG shows sinus rhythm with LBBB.  Troponins are flat at 47 and 51.  BNP was 2100.  At the time of my exam patient was chest pain-free.    Review of Systems: As per HPI, rest all negative.   Past Medical History:  Diagnosis Date   AICD (automatic cardioverter/defibrillator) present    [employer]   Allergic rhinitis    Anemia    Arthritis    Chronic systolic heart failure (HCC)    a. ECHO (12/2012) EF 25-30%, HK entireanteroseptal myocardium //  b.  EF 25%, diffuse HK, grade 1 diastolic dysfunction, MAC, mild LAE, normal RVSF, trivial pericardial effusion   COPD (chronic obstructive pulmonary disease) (HCC)    Diabetes mellitus type II    Diabetic nephropathy (HCC)    Diabetic neuropathy (HCC)    ESRD on hemodialysis (HCC)    started HD June 2017, goes to [facility] HD unit, Dr Lieutenant Reese    History of cardiac catheterization    a.Myoview  1/15:  There is significant left ventricular dysfunction. There may be slight scar at the apex. There is no significant ischemia. LV Ejection Fraction: 27%  //  b. RHC/LHC (1/15) with mean RA 6, PA 47/22 mean 33, mean PCWP 20, PVR 2.5 WU, CI 2.5; 80% dLAD stenosis, 70% diffuse large D.     History of kidney stones    Hyperlipidemia    Hypertension    Kidney stones    NICM (nonischemic cardiomyopathy) (HCC)    Primarily nonischemic.  Echo (12/14) with EF 25-30%.  Echo (3/15) with EF 25%, mild to moderately dilated LV, normal RV size and systolic function.     Osteomyelitis (HCC)    left fifth ray   Pneumonia    Urethral stricture    Wears glasses     Past Surgical History:  Procedure Laterality Date   ABDOMINAL AORTOGRAM W/LOWER EXTREMITY N/A 03/30/2016   Procedure: Abdominal Aortogram w/Lower Extremity;  Surgeon: Dannis Dy, MD;  Location: [facility] INVASIVE CV LAB;  Service: Cardiovascular;  Laterality: N/A;   AMPUTATION Right 04/26/2016   Procedure: Right Below Knee Amputation;  Surgeon: Timothy Ford, MD;  Location: [facility] OR;  Service: Orthopedics;  Laterality: Right;   AMPUTATION Left 08/21/2019   Procedure: LEFT FOOT 5TH RAY AMPUTATION;  Surgeon: Timothy Ford, MD;  Location: [facility] OR;  Service: Orthopedics;  Laterality: Left;   AMPUTATION Left 11/13/2019   Procedure: LEFT BELOW KNEE AMPUTATION;  Surgeon: Julio Ohm,  Marshia Skene, MD;  Location: [facility] OR;  Service: Orthopedics;  Laterality: Left;   AV FISTULA PLACEMENT Right 09/08/2015   Procedure: INSERTION OF 4-65mm x 45cm  ARTERIOVENOUS (AV) GORE-TEX GRAFT RIGHT UPPER  ARM;  Surgeon: Dannis Dy, MD;  Location: [facility] OR;  Service: Vascular;  Laterality: Right;   AV FISTULA PLACEMENT Left 01/14/2016   Procedure: CREATION OF LEFT UPPER ARM ARTERIOVENOUS FISTULA;  Surgeon: Dannis Dy, MD;  Location: [facility] OR;  Service: Vascular;  Laterality: Left;   BASCILIC VEIN TRANSPOSITION Right 08/22/2014    Procedure: RIGHT UPPER ARM BASCILIC VEIN TRANSPOSITION;  Surgeon: Dannis Dy, MD;  Location: [facility] OR;  Service: Vascular;  Laterality: Right;   BELOW KNEE LEG AMPUTATION Right 04/26/2016   BIOPSY OF SKIN SUBCUTANEOUS TISSUE AND/OR MUCOUS MEMBRANE  04/28/2023   Procedure: BIOPSY, SKIN, SUBCUTANEOUS TISSUE, OR MUCOUS MEMBRANE;  Surgeon: Sergio Dandy, MD;  Location: [facility] ENDOSCOPY;  Service: Gastroenterology;;   CARDIAC CATHETERIZATION     CARDIAC DEFIBRILLATOR PLACEMENT  06/27/2013   Sub Q       BY DR Rodolfo Clan   CATARACT EXTRACTION W/PHACO Right 08/06/2018   Procedure: CATARACT EXTRACTION PHACO AND INTRAOCULAR LENS PLACEMENT (IOC);  Surgeon: Tarri Farm, MD;  Location: [facility] ORS;  Service: Ophthalmology;  Laterality: Right;  CDE: 4.06   CATARACT EXTRACTION W/PHACO Left 08/20/2018   Procedure: CATARACT EXTRACTION PHACO AND INTRAOCULAR LENS PLACEMENT (IOC);  Surgeon: Tarri Farm, MD;  Location: [facility] ORS;  Service: Ophthalmology;  Laterality: Left;  CDE: 6.76   COLONOSCOPY WITH PROPOFOL  N/A 07/22/2015   Procedure: COLONOSCOPY WITH PROPOFOL ;  Surgeon: Albertina Hugger, MD;  Location: [facility] ENDOSCOPY;  Service: Gastroenterology;  Laterality: N/A;   CORONARY LITHOTRIPSY N/A 04/14/2023   Procedure: CORONARY LITHOTRIPSY;  Surgeon: Arleen Lacer, MD;  Location: [facility] INVASIVE CV LAB;  Service: Cardiovascular;  Laterality: N/A;  RCA   CORONARY STENT INTERVENTION N/A 04/12/2023   Procedure: CORONARY STENT INTERVENTION;  Surgeon: Swaziland, Peter M, MD;  Location: [facility] INVASIVE CV LAB;  Service: Cardiovascular;  Laterality: N/A;   CORONARY STENT INTERVENTION N/A 04/14/2023   Procedure: CORONARY STENT INTERVENTION;  Surgeon: Arleen Lacer, MD;  Location: [facility] INVASIVE CV LAB;  Service: Cardiovascular;  Laterality: N/A;   ESOPHAGOGASTRODUODENOSCOPY N/A 04/28/2023   Procedure: EGD (ESOPHAGOGASTRODUODENOSCOPY);  Surgeon: Nandigam, Kavitha V, MD;  [facility] ENDOSCOPY;  Service: Gastroenterology;  Laterality: N/A;    FEMORAL-POPLITEAL BYPASS GRAFT Right 03/31/2016   Procedure: BYPASS GRAFT FEMORAL-POPLITEAL ARTERY USING RIGHT GREATER SAPHENOUS NONREVERSED VEIN;  Surgeon: Dannis Dy, MD;  Location: [facility] OR;  Service: Vascular;  Laterality: Right;   HERNIA REPAIR     I & D EXTREMITY Right 03/31/2016   Procedure: IRRIGATION AND DEBRIDEMENT FOOT;  Surgeon: Dannis Dy, MD;  Location: [facility] OR;  Service: Vascular;  Laterality: Right;   IMPLANTABLE CARDIOVERTER DEFIBRILLATOR IMPLANT N/A 06/27/2013   Procedure: SUB Q ICD;  Surgeon: Verona Goodwill, MD;  Location: [facility] CATH LAB;  Service: Cardiovascular;  Laterality: N/A;   INTRAOPERATIVE ARTERIOGRAM Right 03/31/2016   Procedure: INTRA OPERATIVE ARTERIOGRAM;  Surgeon: Dannis Dy, MD;  Location: [facility] OR;  Service: Vascular;  Laterality: Right;   IR GENERIC HISTORICAL Right 11/30/2015   IR THROMBECTOMY AV FISTULA W/THROMBOLYSIS/PTA INC/SHUNT/IMG RIGHT 11/30/2015 Erica Hau, MD [facility]   IR GENERIC HISTORICAL  11/30/2015   IR US  GUIDE VASC ACCESS RIGHT 11/30/2015 Erica Hau, MD [facility]   IR GENERIC HISTORICAL Right 12/15/2015   IR THROMBECTOMY AV FISTULA W/THROMBOLYSIS/PTA/STENT INC/SHUNT/IMG  RT 12/15/2015 Marland Silvas, MD [facility]   IR GENERIC HISTORICAL  12/15/2015   IR US  GUIDE VASC ACCESS RIGHT 12/15/2015 Marland Silvas, MD [facility]   IR GENERIC HISTORICAL  12/28/2015   IR FLUORO GUIDE CV LINE RIGHT 12/28/2015 Artice Last, MD [facility]   IR GENERIC HISTORICAL  12/28/2015   IR US  GUIDE VASC ACCESS RIGHT 12/28/2015 Artice Last, MD [facility]   LEFT A ND RIGHT HEART CATH  01/30/2013   DR Dasie Epps   LEFT AND RIGHT HEART CATHETERIZATION WITH CORONARY ANGIOGRAM N/A 01/30/2013   Procedure: LEFT AND RIGHT HEART CATHETERIZATION WITH CORONARY ANGIOGRAM;  Surgeon: Mardell Shade, MD;  Location: [facility] CATH LAB;  Service: Cardiovascular;  Laterality: N/A;   LEFT HEART CATH AND CORONARY ANGIOGRAPHY N/A 04/12/2023    Procedure: LEFT HEART CATH AND CORONARY ANGIOGRAPHY;  Surgeon: Swaziland, Peter M, MD;  Location: [facility] INVASIVE CV LAB;  Service: Cardiovascular;  Laterality: N/A;   PERIPHERAL VASCULAR CATHETERIZATION Right 01/26/2015   Procedure: A/V Fistulagram;  Surgeon: Dannis Dy, MD;  Location: [facility] INVASIVE CV LAB;  Service: Cardiovascular;  Laterality: Right;   reapea urethral surgery for recurrent obstruction  2011   TOTAL KNEE ARTHROPLASTY Right 2007   VEIN HARVEST Right 03/31/2016   Procedure: RIGHT GREATER SAPHENOUS VEIN HARVEST;  Surgeon: Dannis Dy, MD;  Location: [facility] OR;  Service: Vascular;  Laterality: Right;     reports that he quit smoking about 13 years ago. His smoking use included cigarettes. He started smoking about 45 years ago. He has a 64 pack-year smoking history. He has never used smokeless tobacco. He reports that he does not drink alcohol  and does not use drugs.  Allergies  Allergen Reactions   Epoetin  Alfa Other (See Comments)    Unknown    Ferumoxytol  Other (See Comments)    Unknown    Morphine Sulfate Rash and Other (See Comments)    Itches all over, red spots    Family History  Problem Relation Age of Onset   Bladder Cancer Mother    Alcohol  abuse Father    Melanoma Father    Stroke Maternal Grandmother    Heart Problems Maternal Grandmother        unknown   Diabetes Maternal Grandmother    Heart disease Maternal Grandfather    Prostate cancer Maternal Grandfather     Prior to Admission medications   Medication Sig Start Date End Date Taking? Authorizing Provider  acetaminophen  (TYLENOL ) 325 MG tablet Take 1-2 tablets (325-650 mg total) by mouth every 4 (four) hours as needed for mild pain. 11/29/19  Yes Love, Renay Carota, PA-C  amiodarone  (PACERONE ) 200 MG tablet Take 1 tablet (200 mg total) by mouth 2 (two) times daily for 20 days, THEN 1 tablet (200 mg total) daily. 05/02/23 06/21/23 Yes Jodeane Mulligan, DO  atorvastatin  (LIPITOR ) 80 MG tablet TAKE 1  TABLET BY MOUTH AT BEDTIME Patient taking differently: Take 80 mg by mouth daily. 06/24/22  Yes Burchette, Marijean Shouts, MD  carvedilol  (COREG ) 12.5 MG tablet Take 1 tablet (12.5 mg total) by mouth 2 (two) times daily. 01/25/23  Yes Burchette, Marijean Shouts, MD  clopidogrel  (PLAVIX ) 75 MG tablet Take 1 tablet (75 mg total) by mouth daily with breakfast. 04/16/23  Yes Hongalgi, Anand D, MD  gabapentin  (NEURONTIN ) 300 MG capsule Take 1 capsule (300 mg total) by mouth at bedtime. 04/15/23  Yes Hongalgi, Anand D, MD  HUMALOG  KWIKPEN 200 UNIT/ML KwikPen INJECT A MAXIMUM OF 28 UNITS  SUBCUTANEOUSLY TWICE DAILY WITH LUNCH AND SUPPER PER SLIDING SCALE. APPOINTMENT REQUIRED FOR FUTURE REFILLS Patient taking differently: Inject 7 Units into the skin 3 (three) times daily with meals. Per sliding scale 11/11/22  Yes Burchette, Marijean Shouts, MD  LANTUS  SOLOSTAR 100 UNIT/ML Solostar Pen INJECT 12 UNITS SUBCUTANEOUSLY AT BEDTIME Patient taking differently: Inject 14 Units into the skin at bedtime. 07/15/22  Yes Burchette, Marijean Shouts, MD  midodrine  (PROAMATINE ) 10 MG tablet Take 1 tablet (10 mg total) by mouth every 8 (eight) hours. 05/02/23  Yes Jodeane Mulligan, DO  multivitamin (RENA-VIT) TABS tablet Take 1 tablet by mouth once daily Patient taking differently: Take 1 tablet by mouth daily. 07/06/21  Yes Burchette, Marijean Shouts, MD  nitroGLYCERIN  (NITROSTAT ) 0.4 MG SL tablet Place 1 tablet (0.4 mg total) under the tongue every 5 (five) minutes as needed for chest pain. 04/21/23  Yes Burchette, Marijean Shouts, MD  Olopatadine  HCl 0.2 % SOLN Place 1 drop into both eyes daily as needed (for allergies).    Yes [provider]  pantoprazole  (PROTONIX ) 40 MG tablet Take 1 tablet (40 mg total) by mouth every 12 (twelve) hours. 05/02/23  Yes Jodeane Mulligan, DO  sevelamer  (RENAGEL ) 800 MG tablet Take 1,600 mg by mouth 3 (three) times daily with meals.   Yes [provider]  vancomycin  (VANCOCIN ) 125 MG capsule Take 1 capsule (125 mg total) by  mouth 4 (four) times daily for 14 days, THEN 1 capsule (125 mg total) 2 (two) times daily for 7 days, THEN 1 capsule (125 mg total) daily for 7 days, THEN 1 capsule (125 mg total) every other day for 28 days, THEN 1 capsule (125 mg total) every 3 (three) days for 28 days. 05/02/23 07/25/23 Yes Jodeane Mulligan, DO  VENTOLIN  HFA 108 (90 Base) MCG/ACT inhaler INHALE 1 TO 2 PUFFS BY MOUTH EVERY 6 HOURS AS NEEDED FOR WHEEZING FOR SHORTNESS OF BREATH 05/05/23  Yes Burchette, Marijean Shouts, MD  aspirin  EC 81 MG tablet Take 1 tablet (81 mg total) by mouth daily. Swallow whole. Patient not taking: Reported on 05/22/2023 04/15/23   Hongalgi, Anand D, MD  cinacalcet  (SENSIPAR ) 30 MG tablet Take 1 tablet (30 mg total) by mouth Every Tuesday,Thursday,and Saturday with dialysis. 11/30/19   Love, Renay Carota, PA-C  Continuous Glucose Receiver (FREESTYLE LIBRE 3 READER) DEVI Use to check blood glucose TID 08/11/22   Burchette, Marijean Shouts, MD  Continuous Glucose Sensor (FREESTYLE LIBRE 3 PLUS SENSOR) MISC USE AS DIRECTED TO CHECK GLUCOSE DAILY. CHANGE EVERY 15 DAYS 05/12/23   Burchette, Marijean Shouts, MD  glucose blood test strip Check 1 time daily. E11.9 One Touch Ultra Blue Test Strips 03/10/14   Burchette, Marijean Shouts, MD  Insulin  Pen Needle (BD PEN NEEDLE NANO U/F) 32G X 4 MM MISC USE 1 PEN NEEDLE SUBCUTANEOUSLY WITH INSULIN  4 TIMES DAILY 12/04/19   Burchette, Marijean Shouts, MD    Physical Exam: Constitutional: Moderately built and nourished. Vitals:   05/21/23 2209 05/21/23 2217  BP: (!) 148/60   Pulse: 81   Resp: 15   Temp: 97.7 F (36.5 C)   TempSrc: Temporal   SpO2: 100%   Weight:  88.5 kg  Height:  5\' 10"  (1.778 m)   Eyes: Anicteric no pallor. ENMT: No discharge from the ears eyes nose or mouth. Neck: No mass felt.  No neck rigidity. Respiratory: No rhonchi or crepitations. Cardiovascular: S1-S2 heard. Abdomen: Soft nontender bowel sound present. Musculoskeletal: No edema.  Bilateral BKA. Skin:  No rash. Neurologic: Alert awake  oriented time place and person.  Moves all extremities. Psychiatric: Appear normal.  Normal affect.   Labs on Admission: I have personally reviewed following labs and imaging studies  CBC: Recent Labs  Lab 05/21/23 2226 05/21/23 2233  WBC 9.6  --   HGB 11.4* 12.2*  HCT 36.6* 36.0*  MCV 95.3  --   PLT 207  --    Basic Metabolic Panel: Recent Labs  Lab 05/21/23 2226 05/21/23 2233  NA 136 137  K 3.7 3.7  CL 100  --   CO2 23  --   GLUCOSE 152*  --   BUN 24*  --   CREATININE 7.39*  --   CALCIUM  7.8*  --    GFR: Estimated Creatinine Clearance: 11.8 mL/min (A) (by C-G formula based on SCr of 7.39 mg/dL (H)). Liver Function Tests: No results for input(s): "AST", "ALT", "ALKPHOS", "BILITOT", "PROT", "ALBUMIN " in the last 168 hours. No results for input(s): "LIPASE", "AMYLASE" in the last 168 hours. No results for input(s): "AMMONIA" in the last 168 hours. Coagulation Profile: Recent Labs  Lab 05/21/23 2226  INR 1.1   Cardiac Enzymes: No results for input(s): "CKTOTAL", "CKMB", "CKMBINDEX", "TROPONINI" in the last 168 hours. BNP (last 3 results) No results for input(s): "PROBNP" in the last 8760 hours. HbA1C: No results for input(s): "HGBA1C" in the last 72 hours. CBG: No results for input(s): "GLUCAP" in the last 168 hours. Lipid Profile: No results for input(s): "CHOL", "HDL", "LDLCALC", "TRIG", "CHOLHDL", "LDLDIRECT" in the last 72 hours. Thyroid  Function Tests: No results for input(s): "TSH", "T4TOTAL", "FREET4", "T3FREE", "THYROIDAB" in the last 72 hours. Anemia Panel: No results for input(s): "VITAMINB12", "FOLATE", "FERRITIN", "TIBC", "IRON", "RETICCTPCT" in the last 72 hours. Urine analysis:    Component Value Date/Time   COLORURINE yellow 09/28/2009 1317   APPEARANCEUR Clear 09/28/2009 1317   LABSPEC 1.010 09/28/2009 1317   PHURINE 5.0 09/28/2009 1317   HGBUR 2+ 09/28/2009 1317   BILIRUBINUR negative 03/17/2016 1648   PROTEINUR trace 03/17/2016 1648    UROBILINOGEN 0.2 03/17/2016 1648   UROBILINOGEN 0.2 09/28/2009 1317   NITRITE negative 03/17/2016 1648   NITRITE negative 09/28/2009 1317   LEUKOCYTESUR small (1+) (A) 03/17/2016 1648   Sepsis Labs: @LABRCNTIP (procalcitonin:4,lacticidven:4) )No results found for this or any previous visit (from the past 240 hours).   Radiological Exams on Admission: DG Chest Port 1 View Result Date: 05/21/2023 CLINICAL DATA:  Shortness of breath and chest pain. History of end-stage renal disease. EXAM: PORTABLE CHEST 1 VIEW COMPARISON:  04/30/2023 FINDINGS: Stable heart size and mediastinal contours. Cardiomegaly is stable. Right-sided dialysis catheter in place. Subcutaneous pacer. Moderate right pleural effusion is increased from prior exam. May be a small left pleural effusion. Vascular congestion. No pneumothorax. IMPRESSION: 1. Moderate right pleural effusion, increased from prior exam. Possible small left pleural effusion. 2. Stable cardiomegaly.  Vascular congestion. Electronically Signed   By: Chadwick Colonel M.D.   On: 05/21/2023 23:08    EKG: Independently reviewed.  Sinus rhythm with PVC.  Assessment/Plan Principal Problem:   Acute pulmonary edema (HCC) Active Problems:   Atrial fibrillation (HCC)   Essential hypertension   Type 2 diabetes mellitus with hyperlipidemia (HCC)   COPD (chronic obstructive pulmonary disease) (HCC)   Chronic systolic congestive heart failure (HCC)   CAD (coronary artery disease)   ESRD on dialysis (HCC)   S/P bilateral below knee amputation (HCC)   GERD without esophagitis   S/P primary angioplasty with coronary  stent    Acute pulmonary edema with chest x-ray showing bilateral pleural effusion.  On BiPAP.  Nephrology has been consulted patient is being taken for urgent dialysis. Chest pain with recent CAD status post PCI to LAD and RCA on Plavix  beta-blockers and statins.  Chest pain improved with sublingual nitroglycerin .  Will trend cardiac markers.  Will  consult cardiology. Paroxysmal atrial fibrillation off Eliquis  due to recent GI bleed.  On amiodarone  and beta-blockers. 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 Anemia likely from renal disease follow CBC. Ischemic cardiomyopathy fluid management per nephrology. Diabetes mellitus type 2 last hemoglobin A1c was 6.62 months ago.  Takes glargine insulin  12 units at bedtime.  Will keep patient on 10 units with sliding scale coverage. Recent GI bleed EGD showed duodenal ulcer on PPI twice daily.  Since patient has acute pulmonary edema needing urgent dialysis with chest pain recent PCI will need close monitoring and more than 2 midnight stay.   DVT prophylaxis: Heparin . Code Status: Full code. Family Communication: Discussed with patient. Disposition Plan: Progressive care. Consults called: Nephrology and cardiology. Admission status: Observation.

## 2023-05-22 NOTE — Evaluation (Signed)
 Clinical/Bedside Swallow Evaluation Patient Details  Name: Anthony Bullock MRN: [id number] Date of Birth: [birth year]  Today's Date: 05/22/2023 Time: SLP Start Time (ACUTE ONLY): 1129 SLP Stop Time (ACUTE ONLY): 1146 SLP Time Calculation (min) (ACUTE ONLY): 17 min  Past Medical History:  Past Medical History:  Diagnosis Date   AICD (automatic cardioverter/defibrillator) present    [employer]   Allergic rhinitis    Anemia    Arthritis    Chronic systolic heart failure (HCC)    a. ECHO (12/2012) EF 25-30%, HK entireanteroseptal myocardium //  b.  EF 25%, diffuse HK, grade 1 diastolic dysfunction, MAC, mild LAE, normal RVSF, trivial pericardial effusion   COPD (chronic obstructive pulmonary disease) (HCC)    Diabetes mellitus type II    Diabetic nephropathy (HCC)    Diabetic neuropathy (HCC)    ESRD on hemodialysis (HCC)    started HD June 2017, goes to [facility], Dr Lieutenant Reese   History of cardiac catheterization    a.Myoview  1/15:  There is significant left ventricular dysfunction. There may be slight scar at the apex. There is no significant ischemia. LV Ejection Fraction: 27%  //  b. RHC/LHC (1/15) with mean RA 6, PA 47/22 mean 33, mean PCWP 20, PVR 2.5 WU, CI 2.5; 80% dLAD stenosis, 70% diffuse large D.     History of kidney stones    Hyperlipidemia    Hypertension    Kidney stones    NICM (nonischemic cardiomyopathy) (HCC)    Primarily nonischemic.  Echo (12/14) with EF 25-30%.  Echo (3/15) with EF 25%, mild to moderately dilated LV, normal RV size and systolic function.     Osteomyelitis (HCC)    left fifth ray   Pneumonia    Urethral stricture    Wears glasses    Past Surgical History:  Past Surgical History:  Procedure Laterality Date   ABDOMINAL AORTOGRAM W/LOWER EXTREMITY N/A 03/30/2016   Procedure: Abdominal Aortogram w/Lower Extremity;  Surgeon: Dannis Dy, MD;  Location: [facility] INVASIVE CV LAB;  Service: Cardiovascular;  Laterality: N/A;    AMPUTATION Right 04/26/2016   Procedure: Right Below Knee Amputation;  Surgeon: Timothy Ford, MD;  Location: [facility] OR;  Service: Orthopedics;  Laterality: Right;   AMPUTATION Left 08/21/2019   Procedure: LEFT FOOT 5TH RAY AMPUTATION;  Surgeon: Timothy Ford, MD;  Location: [facility] OR;  Service: Orthopedics;  Laterality: Left;   AMPUTATION Left 11/13/2019   Procedure: LEFT BELOW KNEE AMPUTATION;  Surgeon: Timothy Ford, MD;  Location: [facility] OR;  Service: Orthopedics;  Laterality: Left;   AV FISTULA PLACEMENT Right 09/08/2015   Procedure: INSERTION OF 4-56mm x 45cm  ARTERIOVENOUS (AV) GORE-TEX GRAFT RIGHT UPPER  ARM;  Surgeon: Dannis Dy, MD;  Location: [facility] OR;  Service: Vascular;  Laterality: Right;   AV FISTULA PLACEMENT Left 01/14/2016   Procedure: CREATION OF LEFT UPPER ARM ARTERIOVENOUS FISTULA;  Surgeon: Dannis Dy, MD;  Location: [facility] OR;  Service: Vascular;  Laterality: Left;   BASCILIC VEIN TRANSPOSITION Right 08/22/2014   Procedure: RIGHT UPPER ARM BASCILIC VEIN TRANSPOSITION;  Surgeon: Dannis Dy, MD;  Location: [facility] OR;  Service: Vascular;  Laterality: Right;   BELOW KNEE LEG AMPUTATION Right 04/26/2016   BIOPSY OF SKIN SUBCUTANEOUS TISSUE AND/OR MUCOUS MEMBRANE  04/28/2023   Procedure: BIOPSY, SKIN, SUBCUTANEOUS TISSUE, OR MUCOUS MEMBRANE;  Surgeon: Sergio Dandy, MD;  Location: [facility] ENDOSCOPY;  Service: Gastroenterology;;   CARDIAC CATHETERIZATION     CARDIAC DEFIBRILLATOR PLACEMENT  06/27/2013   Sub Q       BY DR Rodolfo Clan   CATARACT EXTRACTION W/PHACO Right 08/06/2018   Procedure: CATARACT EXTRACTION PHACO AND INTRAOCULAR LENS PLACEMENT (IOC);  Surgeon: Tarri Farm, MD;  Location: [facility] ORS;  Service: Ophthalmology;  Laterality: Right;  CDE: 4.06   CATARACT EXTRACTION W/PHACO Left 08/20/2018   Procedure: CATARACT EXTRACTION PHACO AND INTRAOCULAR LENS PLACEMENT (IOC);  Surgeon: Tarri Farm, MD;  Location: [facility] ORS;  Service: Ophthalmology;  Laterality: Left;  CDE: 6.76    COLONOSCOPY WITH PROPOFOL  N/A 07/22/2015   Procedure: COLONOSCOPY WITH PROPOFOL ;  Surgeon: Albertina Hugger, MD;  Location: [facility] ENDOSCOPY;  Service: Gastroenterology;  Laterality: N/A;   CORONARY LITHOTRIPSY N/A 04/14/2023   Procedure: CORONARY LITHOTRIPSY;  Surgeon: Arleen Lacer, MD;  Location: [facility] INVASIVE CV LAB;  Service: Cardiovascular;  Laterality: N/A;  RCA   CORONARY STENT INTERVENTION N/A 04/12/2023   Procedure: CORONARY STENT INTERVENTION;  Surgeon: Swaziland, Peter M, MD;  Location: [facility] INVASIVE CV LAB;  Service: Cardiovascular;  Laterality: N/A;   CORONARY STENT INTERVENTION N/A 04/14/2023   Procedure: CORONARY STENT INTERVENTION;  Surgeon: Arleen Lacer, MD;  Location: [facility] INVASIVE CV LAB;  Service: Cardiovascular;  Laterality: N/A;   ESOPHAGOGASTRODUODENOSCOPY N/A 04/28/2023   Procedure: EGD (ESOPHAGOGASTRODUODENOSCOPY);  Surgeon: Nandigam, Kavitha V, MD;  Location: [facility] ENDOSCOPY;  Service: Gastroenterology;  Laterality: N/A;   FEMORAL-POPLITEAL BYPASS GRAFT Right 03/31/2016   Procedure: BYPASS GRAFT FEMORAL-POPLITEAL ARTERY USING RIGHT GREATER SAPHENOUS NONREVERSED VEIN;  Surgeon: Dannis Dy, MD;  Location: [facility] OR;  Service: Vascular;  Laterality: Right;   HERNIA REPAIR     I & D EXTREMITY Right 03/31/2016   Procedure: IRRIGATION AND DEBRIDEMENT FOOT;  Surgeon: Dannis Dy, MD;  Location: [facility] OR;  Service: Vascular;  Laterality: Right;   IMPLANTABLE CARDIOVERTER DEFIBRILLATOR IMPLANT N/A 06/27/2013   Procedure: SUB Q ICD;  Surgeon: Verona Goodwill, MD;  Location: [facility] CATH LAB;  Service: Cardiovascular;  Laterality: N/A;   INTRAOPERATIVE ARTERIOGRAM Right 03/31/2016   Procedure: INTRA OPERATIVE ARTERIOGRAM;  Surgeon: Dannis Dy, MD;  Location: [facility] OR;  Service: Vascular;  Laterality: Right;   IR GENERIC HISTORICAL Right 11/30/2015   IR THROMBECTOMY AV FISTULA W/THROMBOLYSIS/PTA INC/SHUNT/IMG RIGHT 11/30/2015 Erica Hau, MD [facility]   IR GENERIC HISTORICAL   11/30/2015   IR US  GUIDE VASC ACCESS RIGHT 11/30/2015 Erica Hau, MD [facility]   IR GENERIC HISTORICAL Right 12/15/2015   IR THROMBECTOMY AV FISTULA W/THROMBOLYSIS/PTA/STENT INC/SHUNT/IMG RT 12/15/2015 Marland Silvas, MD [facility]   IR GENERIC HISTORICAL  12/15/2015   IR US  GUIDE VASC ACCESS RIGHT 12/15/2015 Marland Silvas, MD [facility]   IR GENERIC HISTORICAL  12/28/2015   IR FLUORO GUIDE CV LINE RIGHT 12/28/2015 Artice Last, MD [facility]   IR GENERIC HISTORICAL  12/28/2015   IR US  GUIDE VASC ACCESS RIGHT 12/28/2015 Artice Last, MD [facility]   LEFT A ND RIGHT HEART CATH  01/30/2013   DR Dasie Epps   LEFT AND RIGHT HEART CATHETERIZATION WITH CORONARY ANGIOGRAM N/A 01/30/2013   Procedure: LEFT AND RIGHT HEART CATHETERIZATION WITH CORONARY ANGIOGRAM;  Surgeon: Mardell Shade, MD;  Location: [facility] CATH LAB;  Service: Cardiovascular;  Laterality: N/A;   LEFT HEART CATH AND CORONARY ANGIOGRAPHY N/A 04/12/2023   Procedure: LEFT HEART CATH AND CORONARY ANGIOGRAPHY;  Surgeon: Swaziland, Peter M, MD;  Location: [name] INVASIVE CV LAB;  Service: Cardiovascular;  Laterality: N/A;   PERIPHERAL VASCULAR CATHETERIZATION Right 01/26/2015   Procedure: A/V Fistulagram;  Surgeon: Dannis Dy, MD;  Location: [facility] INVASIVE CV LAB;  Service: Cardiovascular;  Laterality: Right;   reapea urethral surgery for recurrent obstruction  2011   TOTAL KNEE ARTHROPLASTY Right 2007   VEIN HARVEST Right 03/31/2016   Procedure: RIGHT GREATER SAPHENOUS VEIN HARVEST;  Surgeon: Dannis Dy, MD;  Location: [facility] OR;  Service: Vascular;  Laterality: Right;   HPI:  64 yo male presenting 5/11 with shortness of breath. Admitted with acute hypoxic respiratory failure secondary to pulmonary edema requiring BiPAP and emergent HD. Pt notes that he had trouble swallowing a large pill at home, which seemed to precipitate admission. PMH includes: COPD, ESRD, PNA, DMII, CHF, PAD-s/p bilateral BKA, PAF, CAD-s/p recent PCI  to LAD/RCA on 4/2-recent hospitalization for upper GI bleed secondary to duodenal ulcer    Assessment / Plan / Recommendation  Clinical Impression  Pt's oropharyngeal swallow appears to be [facility] with no overt signs of dysphagia or aspiration. Oral motor exam is WFL. Pt says that he has been having trouble swallowing one specific medication, which is new onset since recent GIB. He says he has to take three of these pills at a time, three times a day, and they are feeling like they are getting stuck. He denies trouble with any of his other pills. Recommend that he stay on regular solids and liquids, taking pills as tolerated. Suggested that he try pills whole in puree, and encouraged him to talk to his doctor about the medication that gives him the most trouble, to see if there are any options for further modifications. No acute SLP needs identified, so will s/o.  SLP Visit Diagnosis: Dysphagia, unspecified (R13.10)    Aspiration Risk       Diet Recommendation Regular;Thin liquid    Liquid Administration via: Cup;Straw Medication Administration: Whole meds with liquid (as tolerated - can try whole with puree; encouraged him to talk with MD about specific pill that he has trouble swallowing) Supervision: Patient able to self feed Compensations: Slow rate;Small sips/bites;Follow solids with liquid Postural Changes: Seated upright at 90 degrees;Remain upright for at least 30 minutes after po intake    Other  Recommendations Oral Care Recommendations: Oral care BID    Recommendations for follow up therapy are one component of a multi-disciplinary discharge planning process, led by the attending physician.  Recommendations may be updated based on patient status, additional functional criteria and insurance authorization.  Follow up Recommendations No SLP follow up      Assistance Recommended at Discharge    Functional Status Assessment Patient has not had a recent decline in their functional  status  Frequency and Duration            Prognosis        Swallow Study   General HPI: 63 yo male presenting 5/11 with shortness of breath. Admitted with acute hypoxic respiratory failure secondary to pulmonary edema requiring BiPAP and emergent HD. Pt notes that he had trouble swallowing a large pill at home, which seemed to precipitate admission. PMH includes: COPD, ESRD, PNA, DMII, CHF, PAD-s/p bilateral BKA, PAF, CAD-s/p recent PCI to LAD/RCA on 4/2-recent hospitalization for upper GI bleed secondary to duodenal ulcer Type of Study: Bedside Swallow Evaluation Previous Swallow Assessment: none in chart Diet Prior to this Study: Regular;Thin liquids (Level 0) Temperature Spikes Noted: No Respiratory Status: Room air History of Recent Intubation: No Behavior/Cognition: Alert;Cooperative;Pleasant mood Oral Cavity Assessment: Within Functional Limits Oral Care Completed by SLP: No Oral Cavity - Dentition: Adequate  natural dentition Vision: Functional for self-feeding Self-Feeding Abilities: Able to feed self Patient Positioning: Upright in bed Baseline Vocal Quality: Normal Volitional Cough: Strong Volitional Swallow: Able to elicit    Oral/Motor/Sensory Function Overall Oral Motor/Sensory Function: Within functional limits   Ice Chips Ice chips: Not tested   Thin Liquid Thin Liquid: Within functional limits Presentation: Cup;Self Fed;Straw    Nectar Thick Nectar Thick Liquid: Not tested   Honey Thick Honey Thick Liquid: Not tested   Puree Puree: Within functional limits Presentation: Self Fed;Spoon   Solid     Solid: Within functional limits Presentation: Self Fed      Beth Brooke., M.A. CCC-SLP Acute Rehabilitation Services Office: [phone]  Secure chat preferred  05/22/2023,11:50 AM

## 2023-05-23 ENCOUNTER — Telehealth: Payer: Self-pay

## 2023-05-23 DIAGNOSIS — Z992 Dependence on renal dialysis: Secondary | ICD-10-CM | POA: Diagnosis not present

## 2023-05-23 DIAGNOSIS — D631 Anemia in chronic kidney disease: Secondary | ICD-10-CM | POA: Diagnosis not present

## 2023-05-23 DIAGNOSIS — D509 Iron deficiency anemia, unspecified: Secondary | ICD-10-CM | POA: Diagnosis not present

## 2023-05-23 DIAGNOSIS — N2581 Secondary hyperparathyroidism of renal origin: Secondary | ICD-10-CM | POA: Diagnosis not present

## 2023-05-23 DIAGNOSIS — N186 End stage renal disease: Secondary | ICD-10-CM | POA: Diagnosis not present

## 2023-05-23 DIAGNOSIS — N250 Renal osteodystrophy: Secondary | ICD-10-CM | POA: Diagnosis not present

## 2023-05-23 LAB — HEPATITIS B SURFACE ANTIBODY, QUANTITATIVE: Hep B S AB Quant (Post): <3.5 m[IU]/mL — ABNORMAL LOW

## 2023-05-23 NOTE — Transitions of Care (Post Inpatient/ED Visit) (Signed)
 05/23/2023  Name: Anthony Bullock MRN: [id number] DOB: 12/30/1960  Today's TOC FU Call Status: Today's TOC FU Call Status:: Successful TOC FU Call Completed TOC FU Call Complete Date: 05/23/23 Patient's Name and Date of Birth confirmed.  Transition Care Management Follow-up Telephone Call Date of Discharge: 05/22/23 Discharge Facility: [facility]) Type of Discharge: Emergency Department Primary Inpatient Discharge Diagnosis:: Pulmonary edema How have you been since you were released from the hospital?: Better Any questions or concerns?: No  Items Reviewed: Did you receive and understand the discharge instructions provided?: Yes Medications obtained,verified, and reconciled?: Yes (Medications Reviewed) Any new allergies since your discharge?: No Dietary orders reviewed?: Yes Do you have support at home?: Yes People in Home [RPT]: spouse  Medications Reviewed Today: Medications Reviewed Today     Reviewed by Darrall Ellison, LPN (Licensed Practical Nurse) on 05/23/23 at 1427  Med List Status: <None>   Medication Order Taking? Sig Documenting Provider Last Dose Status Informant  acetaminophen  (TYLENOL ) 325 MG tablet [id number] No Take 1-2 tablets (325-650 mg total) by mouth every 4 (four) hours as needed for mild pain. Jairo Mayer 05/20/2023 Active Pharmacy Records, Self  amiodarone  (PACERONE ) 200 MG tablet [id number] No Take 1 tablet (200 mg total) by mouth 2 (two) times daily for 20 days, THEN 1 tablet (200 mg total) daily. Jodeane Mulligan, DO 05/21/2023 Morning Active Self, Pharmacy Records  apixaban  (ELIQUIS ) 5 MG TABS tablet [id number]  Take 1 tablet (5 mg total) by mouth 2 (two) times daily. Burton Casey, MD  Active   atorvastatin  (LIPITOR ) 80 MG tablet [id number] No TAKE 1 TABLET BY MOUTH AT BEDTIME  Patient taking differently: Take 80 mg by mouth daily.   Marquetta Sit, MD 05/21/2023 Morning Active Pharmacy Records, Self  carvedilol  (COREG ) 12.5 MG tablet  [id number] No Take 1 tablet (12.5 mg total) by mouth 2 (two) times daily. Marquetta Sit, MD 05/21/2023 Morning Active Pharmacy Records, Self  cinacalcet  (SENSIPAR ) 30 MG tablet [id number] No Take 1 tablet (30 mg total) by mouth Every Tuesday,Thursday,and Saturday with dialysis. Jairo Mayer 05/20/2023 Active Pharmacy Records, Self  clopidogrel  (PLAVIX ) 75 MG tablet [id number]  Take 1 tablet (75 mg total) by mouth daily with breakfast. Burton Casey, MD  Active   Continuous Glucose Receiver (FREESTYLE LIBRE 3 READER) DEVI [id number] No Use to check blood glucose TID Marquetta Sit, MD Taking Active Pharmacy Records, Self  Continuous Glucose Sensor (FREESTYLE LIBRE 3 PLUS SENSOR) [place] [id number]  USE AS DIRECTED TO CHECK GLUCOSE DAILY. CHANGE EVERY 15 DAYS Burchette, Marijean Shouts, MD  Active Self, Pharmacy Records  gabapentin  (NEURONTIN ) 300 MG capsule [id number] No Take 1 capsule (300 mg total) by mouth at bedtime. Casey Clay, MD 05/20/2023 Active Self, Pharmacy Records  glucose blood test strip [id number] No Check 1 time daily. E11.9 One Touch Ultra Blue Test Strips Marquetta Sit, MD Taking Active Pharmacy Records, Self  HUMALOG  [facility] 200 UNIT/ML KwikPen [id number] No INJECT A MAXIMUM OF 28 UNITS SUBCUTANEOUSLY TWICE DAILY WITH LUNCH AND SUPPER PER SLIDING SCALE. APPOINTMENT REQUIRED FOR FUTURE REFILLS  Patient taking differently: Inject 7 Units into the skin 3 (three) times daily with meals. Per sliding scale   Marquetta Sit, MD 05/21/2023 Morning Active Pharmacy Records, Self  Insulin  Pen Needle (BD PEN NEEDLE NANO U/F) 32G X 4 MM MISC [id number] No USE 1 PEN NEEDLE SUBCUTANEOUSLY WITH INSULIN  4 TIMES DAILY Burchette, Marijean Shouts, MD Taking Active Pharmacy Records, Self  LANTUS  SOLOSTAR 100 UNIT/ML Solostar Pen [id number] No INJECT 12 UNITS SUBCUTANEOUSLY AT BEDTIME  Patient taking differently: Inject 14 Units into the skin at bedtime.   Marquetta Sit, MD 05/20/2023 Active  Pharmacy Records, Self  midodrine  (PROAMATINE ) 10 MG tablet [id number] No Take 1 tablet (10 mg total) by mouth every 8 (eight) hours. Jodeane Mulligan, DO 05/21/2023 Morning Active Self, Pharmacy Records  multivitamin (RENA-VIT) TABS tablet [id number] No Take 1 tablet by mouth once daily  Patient taking differently: Take 1 tablet by mouth daily.   Marquetta Sit, MD 05/21/2023 Morning Active Pharmacy Records, Self  nitroGLYCERIN  (NITROSTAT ) 0.4 MG SL tablet [id number] No Place 1 tablet (0.4 mg total) under the tongue every 5 (five) minutes as needed for chest pain. Marquetta Sit, MD 05/21/2023 Evening Active Self, Pharmacy Records  Olopatadine  HCl 0.2 % SOLN [id number] No Place 1 drop into both eyes daily as needed (for allergies).  [provider] 05/20/2023 Active Pharmacy Records, Self  pantoprazole  (PROTONIX ) 40 MG tablet [id number] No Take 1 tablet (40 mg total) by mouth every 12 (twelve) hours. Jodeane Mulligan, DO 05/21/2023 Morning Active Self, Pharmacy Records  sevelamer  (RENAGEL ) 800 MG tablet [id number] No Take 1,600 mg by mouth 3 (three) times daily with meals. [provider] 05/21/2023 Evening Active Pharmacy Records, Self  vancomycin  (VANCOCIN ) 125 MG capsule [id number] No Take 1 capsule (125 mg total) by mouth 4 (four) times daily for 14 days, THEN 1 capsule (125 mg total) 2 (two) times daily for 7 days, THEN 1 capsule (125 mg total) daily for 7 days, THEN 1 capsule (125 mg total) every other day for 28 days, THEN 1 capsule (125 mg total) every 3 (three) days for 28 days. Jodeane Mulligan, DO 05/21/2023 Morning Active Self, Pharmacy Records  VENTOLIN  HFA 108 [phone] Base) MCG/ACT inhaler [id number] No INHALE 1 TO 2 PUFFS BY MOUTH EVERY 6 HOURS AS NEEDED FOR WHEEZING FOR SHORTNESS OF Oneida Bigger, MD 05/21/2023 Bedtime Active Self, Pharmacy Records  Med List Note Sierra Dresser 07/09/15 1108): Pt has dialysis on Tuesdays, Thursdays, saturdays             Home Care and Equipment/Supplies: Were Home Health Services Ordered?: NA Any new equipment or medical supplies ordered?: NA  Functional Questionnaire: Do you need assistance with bathing/showering or dressing?: No Do you need assistance with meal preparation?: No Do you need assistance with eating?: No Do you have difficulty maintaining continence: No Do you need assistance with getting out of bed/getting out of a chair/moving?: No Do you have difficulty managing or taking your medications?: No  Follow up appointments reviewed: PCP Follow-up appointment confirmed?: Yes Date of PCP follow-up appointment?: 05/24/23 Follow-up Provider: [facility] Follow-up appointment confirmed?: Yes Date of Specialist follow-up appointment?: 06/09/23 Follow-Up Specialty Provider:: cardio Do you need transportation to your follow-up appointment?: No Do you understand care options if your condition(s) worsen?: Yes-patient verbalized understanding    SIGNATURE Darrall Ellison, LPN [facility] Nurse Health Advisor Direct Dial  [phone]

## 2023-05-24 ENCOUNTER — Ambulatory Visit: Admitting: Family Medicine

## 2023-05-24 ENCOUNTER — Encounter: Payer: Self-pay | Admitting: Family Medicine

## 2023-05-24 VITALS — BP 124/94 | HR 71 | Temp 97.7°F | Wt 187.7 lb

## 2023-05-24 DIAGNOSIS — E118 Type 2 diabetes mellitus with unspecified complications: Secondary | ICD-10-CM | POA: Diagnosis not present

## 2023-05-24 DIAGNOSIS — I480 Paroxysmal atrial fibrillation: Secondary | ICD-10-CM | POA: Diagnosis not present

## 2023-05-24 DIAGNOSIS — I5022 Chronic systolic (congestive) heart failure: Secondary | ICD-10-CM | POA: Diagnosis not present

## 2023-05-24 DIAGNOSIS — J810 Acute pulmonary edema: Secondary | ICD-10-CM

## 2023-05-24 DIAGNOSIS — Z794 Long term (current) use of insulin: Secondary | ICD-10-CM | POA: Diagnosis not present

## 2023-05-24 MED ORDER — AMIODARONE HCL 200 MG PO TABS: ORAL_TABLET | ORAL | 2 refills | Status: DC

## 2023-05-24 MED ORDER — FREESTYLE LIBRE 3 PLUS SENSOR MISC: 6 refills | Status: DC

## 2023-05-24 NOTE — Progress Notes (Unsigned)
 Established Patient Office Visit  Subjective   Patient ID: Anthony Bullock, male    DOB: 02/01/1960  Age: 63 y.o. MRN: [id number]  Chief Complaint  Patient presents with   Hospitalization Follow-up    HPI  {History (Optional):23778} Anthony Bullock is seen for hospital follow-up.  Multiple recent admissions.  He was admitted 511 through 512 for volume overload and acute hypoxic respiratory failure secondary to pulmonary edema requiring BiPAP and emergent hemodialysis.  He has end-stage renal disease and is on hemodialysis every Tuesday, Thursday, and Saturday.  He has peripheral artery disease status post bilateral BKA, history of atrial fibrillation, CAD with recent MI and stent placement.  He had recent hospitalization in early April for upper GI bleed secondary to duodenal ulcer.  He does relate some slightly decreased dietary compliance over Mother's Day which he thinks may have exacerbated.  Has previously tried to maintain dry weight of 84.5 kg and his nephrologist has adjusted this to 82.5.  [name] has been fairly diligent with regard to diet overall.  He has done a much better job controlling his sugar intake and last A1c was 6.6%.  Recent hospitalization reviewed.  BNP levels 2179.  Denies any dyspnea at this time.  No chest pains.  Patient also had recent C. difficile colitis and is currently on prolonged vancomycin  taper with no recurrent diarrhea.  He is requesting refills of amiodarone  until he can get back into see cardiology.  Also need refills of his freestyle libre sensor  Past Medical History:  Diagnosis Date   AICD (automatic cardioverter/defibrillator) present    [employer]   Allergic rhinitis    Anemia    Arthritis    Chronic systolic heart failure (HCC)    a. ECHO (12/2012) EF 25-30%, HK entireanteroseptal myocardium //  b.  EF 25%, diffuse HK, grade 1 diastolic dysfunction, MAC, mild LAE, normal RVSF, trivial pericardial effusion   COPD (chronic obstructive pulmonary  disease) (HCC)    Diabetes mellitus type II    Diabetic nephropathy (HCC)    Diabetic neuropathy (HCC)    ESRD on hemodialysis (HCC)    started HD June 2017, goes to [facility] HD unit, Dr Lieutenant Reese   History of cardiac catheterization    a.Myoview  1/15:  There is significant left ventricular dysfunction. There may be slight scar at the apex. There is no significant ischemia. LV Ejection Fraction: 27%  //  b. RHC/LHC (1/15) with mean RA 6, PA 47/22 mean 33, mean PCWP 20, PVR 2.5 WU, CI 2.5; 80% dLAD stenosis, 70% diffuse large D.     History of kidney stones    Hyperlipidemia    Hypertension    Kidney stones    NICM (nonischemic cardiomyopathy) (HCC)    Primarily nonischemic.  Echo (12/14) with EF 25-30%.  Echo (3/15) with EF 25%, mild to moderately dilated LV, normal RV size and systolic function.     Osteomyelitis (HCC)    left fifth ray   Pneumonia    Urethral stricture    Wears glasses    Past Surgical History:  Procedure Laterality Date   ABDOMINAL AORTOGRAM W/LOWER EXTREMITY N/A 03/30/2016   Procedure: Abdominal Aortogram w/Lower Extremity;  Surgeon: Dannis Dy, MD;  Location: [facility] INVASIVE CV LAB;  Service: Cardiovascular;  Laterality: N/A;   AMPUTATION Right 04/26/2016   Procedure: Right Below Knee Amputation;  Surgeon: Timothy Ford, MD;  Location: [facility] OR;  Service: Orthopedics;  Laterality: Right;   AMPUTATION Left 08/21/2019   Procedure:  LEFT FOOT 5TH RAY AMPUTATION;  Surgeon: Timothy Ford, MD;  Location: [facility] OR;  Service: Orthopedics;  Laterality: Left;   AMPUTATION Left 11/13/2019   Procedure: LEFT BELOW KNEE AMPUTATION;  Surgeon: Timothy Ford, MD;  Location: [facility] OR;  Service: Orthopedics;  Laterality: Left;   AV FISTULA PLACEMENT Right 09/08/2015   Procedure: INSERTION OF 4-43mm x 45cm  ARTERIOVENOUS (AV) GORE-TEX GRAFT RIGHT UPPER  ARM;  Surgeon: Dannis Dy, MD;  Location: [facility] OR;  Service: Vascular;  Laterality: Right;   AV FISTULA PLACEMENT Left 01/14/2016    Procedure: CREATION OF LEFT UPPER ARM ARTERIOVENOUS FISTULA;  Surgeon: Dannis Dy, MD;  Location: [facility] OR;  Service: Vascular;  Laterality: Left;   BASCILIC VEIN TRANSPOSITION Right 08/22/2014   Procedure: RIGHT UPPER ARM BASCILIC VEIN TRANSPOSITION;  Surgeon: Dannis Dy, MD;  Location: [facility] OR;  Service: Vascular;  Laterality: Right;   BELOW KNEE LEG AMPUTATION Right 04/26/2016   BIOPSY OF SKIN SUBCUTANEOUS TISSUE AND/OR MUCOUS MEMBRANE  04/28/2023   Procedure: BIOPSY, SKIN, SUBCUTANEOUS TISSUE, OR MUCOUS MEMBRANE;  Surgeon: Sergio Dandy, MD;  Location: [facility] ENDOSCOPY;  Service: Gastroenterology;;   CARDIAC CATHETERIZATION     CARDIAC DEFIBRILLATOR PLACEMENT  06/27/2013   Sub Q       BY DR Rodolfo Clan   CATARACT EXTRACTION W/PHACO Right 08/06/2018   Procedure: CATARACT EXTRACTION PHACO AND INTRAOCULAR LENS PLACEMENT (IOC);  Surgeon: Tarri Farm, MD;  Location: [facility] ORS;  Service: Ophthalmology;  Laterality: Right;  CDE: 4.06   CATARACT EXTRACTION W/PHACO Left 08/20/2018   Procedure: CATARACT EXTRACTION PHACO AND INTRAOCULAR LENS PLACEMENT (IOC);  Surgeon: Tarri Farm, MD;  Location: [facility] ORS;  Service: Ophthalmology;  Laterality: Left;  CDE: 6.76   COLONOSCOPY WITH PROPOFOL  N/A 07/22/2015   Procedure: COLONOSCOPY WITH PROPOFOL ;  Surgeon: Albertina Hugger, MD;  Location: [facility] ENDOSCOPY;  Service: Gastroenterology;  Laterality: N/A;   CORONARY LITHOTRIPSY N/A 04/14/2023   Procedure: CORONARY LITHOTRIPSY;  Surgeon: Arleen Lacer, MD;  Location: [facility] INVASIVE CV LAB;  Service: Cardiovascular;  Laterality: N/A;  RCA   CORONARY STENT INTERVENTION N/A 04/12/2023   Procedure: CORONARY STENT INTERVENTION;  Surgeon: Swaziland, Peter M, MD;  Location: [facility] INVASIVE CV LAB;  Service: Cardiovascular;  Laterality: N/A;   CORONARY STENT INTERVENTION N/A 04/14/2023   Procedure: CORONARY STENT INTERVENTION;  Surgeon: Arleen Lacer, MD;  Location: [facility] INVASIVE CV LAB;  Service: Cardiovascular;  Laterality:  N/A;   ESOPHAGOGASTRODUODENOSCOPY N/A 04/28/2023   Procedure: EGD (ESOPHAGOGASTRODUODENOSCOPY);  Surgeon: Nandigam, Kavitha V, MD;  Location: [facility] ENDOSCOPY;  Service: Gastroenterology;  Laterality: N/A;   FEMORAL-POPLITEAL BYPASS GRAFT Right 03/31/2016   Procedure: BYPASS GRAFT FEMORAL-POPLITEAL ARTERY USING RIGHT GREATER SAPHENOUS NONREVERSED VEIN;  Surgeon: Dannis Dy, MD;  Location: [facility] OR;  Service: Vascular;  Laterality: Right;   HERNIA REPAIR     I & D EXTREMITY Right 03/31/2016   Procedure: IRRIGATION AND DEBRIDEMENT FOOT;  Surgeon: Dannis Dy, MD;  Location: [facility] OR;  Service: Vascular;  Laterality: Right;   IMPLANTABLE CARDIOVERTER DEFIBRILLATOR IMPLANT N/A 06/27/2013   Procedure: SUB Q ICD;  Surgeon: Verona Goodwill, MD;  Location: [facility] CATH LAB;  Service: Cardiovascular;  Laterality: N/A;   INTRAOPERATIVE ARTERIOGRAM Right 03/31/2016   Procedure: INTRA OPERATIVE ARTERIOGRAM;  Surgeon: Dannis Dy, MD;  Location: [name] OR;  Service: Vascular;  Laterality: Right;   IR GENERIC HISTORICAL Right 11/30/2015   IR THROMBECTOMY AV FISTULA W/THROMBOLYSIS/PTA INC/SHUNT/IMG RIGHT 11/30/2015 Erica Hau, MD [facility]  IR GENERIC HISTORICAL  11/30/2015   IR US  GUIDE VASC ACCESS RIGHT 11/30/2015 Erica Hau, MD [facility]   IR GENERIC HISTORICAL Right 12/15/2015   IR THROMBECTOMY AV FISTULA W/THROMBOLYSIS/PTA/STENT INC/SHUNT/IMG RT 12/15/2015 Marland Silvas, MD [facility]   IR GENERIC HISTORICAL  12/15/2015   IR US  GUIDE VASC ACCESS RIGHT 12/15/2015 Marland Silvas, MD [facility]   IR GENERIC HISTORICAL  12/28/2015   IR FLUORO GUIDE CV LINE RIGHT 12/28/2015 Artice Last, MD [facility]   IR GENERIC HISTORICAL  12/28/2015   IR US  GUIDE VASC ACCESS RIGHT 12/28/2015 Artice Last, MD [facility]   LEFT A ND RIGHT HEART CATH  01/30/2013   DR Dasie Epps   LEFT AND RIGHT HEART CATHETERIZATION WITH CORONARY ANGIOGRAM N/A 01/30/2013   Procedure: LEFT AND RIGHT HEART  CATHETERIZATION WITH CORONARY ANGIOGRAM;  Surgeon: Mardell Shade, MD;  Location: [facility] CATH LAB;  Service: Cardiovascular;  Laterality: N/A;   LEFT HEART CATH AND CORONARY ANGIOGRAPHY N/A 04/12/2023   Procedure: LEFT HEART CATH AND CORONARY ANGIOGRAPHY;  Surgeon: Swaziland, Peter M, MD;  Location: [facility] INVASIVE CV LAB;  Service: Cardiovascular;  Laterality: N/A;   PERIPHERAL VASCULAR CATHETERIZATION Right 01/26/2015   Procedure: A/V Fistulagram;  Surgeon: Dannis Dy, MD;  Location: [facility] INVASIVE CV LAB;  Service: Cardiovascular;  Laterality: Right;   reapea urethral surgery for recurrent obstruction  2011   TOTAL KNEE ARTHROPLASTY Right 2007   VEIN HARVEST Right 03/31/2016   Procedure: RIGHT GREATER SAPHENOUS VEIN HARVEST;  Surgeon: Dannis Dy, MD;  Location: [facility] OR;  Service: Vascular;  Laterality: Right;    reports that he quit smoking about 13 years ago. His smoking use included cigarettes. He started smoking about 45 years ago. He has a 64 pack-year smoking history. He has never used smokeless tobacco. He reports that he does not drink alcohol  and does not use drugs. family history includes Alcohol  abuse in his father; Bladder Cancer in his mother; Diabetes in his maternal grandmother; Heart Problems in his maternal grandmother; Heart disease in his maternal grandfather; Melanoma in his father; Prostate cancer in his maternal grandfather; Stroke in his maternal grandmother. Allergies  Allergen Reactions   Epoetin  Alfa Other (See Comments)    Unknown    Ferumoxytol  Other (See Comments)    Unknown    Morphine Sulfate Rash and Other (See Comments)    Itches all over, red spots    Review of Systems  Constitutional:  Negative for chills, fever and malaise/fatigue.  Eyes:  Negative for blurred vision.  Respiratory:  Negative for shortness of breath.   Cardiovascular:  Negative for chest pain.  Neurological:  Negative for dizziness, weakness and headaches.      Objective:      BP (!) 124/94 (BP Location: Left Arm, Patient Position: Sitting, Cuff Size: Normal)   Pulse 71   Temp 97.7 F (36.5 C) (Oral)   Wt 187 lb 11.2 oz (85.1 kg)   SpO2 99%   BMI 26.93 kg/m  BP Readings from Last 3 Encounters:  05/24/23 (!) 124/94  05/22/23 (!) 144/52  05/12/23 (!) 122/48   Wt Readings from Last 3 Encounters:  05/24/23 187 lb 11.2 oz (85.1 kg)  05/21/23 195 lb (88.5 kg)  05/12/23 195 lb 14.4 oz (88.9 kg)      Physical Exam Vitals reviewed.  Constitutional:      General: He is not in acute distress.    Appearance: He is well-developed. He is not ill-appearing.  Eyes:  Pupils: Pupils are equal, round, and reactive to light.  Neck:     Thyroid : No thyromegaly.  Cardiovascular:     Rate and Rhythm: Normal rate and regular rhythm.  Pulmonary:     Effort: Pulmonary effort is normal. No respiratory distress.     Breath sounds: Normal breath sounds. No wheezing or rales.  Musculoskeletal:     Cervical back: Neck supple.  Neurological:     Mental Status: He is alert and oriented to person, place, and time.      No results found for any visits on 05/24/23.  Last CBC Lab Results  Component Value Date   WBC 12.6 (H) 05/22/2023   HGB 11.5 (L) 05/22/2023   HCT 36.9 (L) 05/22/2023   MCV 93.2 05/22/2023   MCH 29.0 05/22/2023   RDW 14.7 05/22/2023   PLT 203 05/22/2023   Last metabolic panel Lab Results  Component Value Date   GLUCOSE 106 (H) 05/22/2023   NA 135 05/22/2023   K 3.3 (L) 05/22/2023   CL 98 05/22/2023   CO2 26 05/22/2023   BUN 10 05/22/2023   CREATININE 4.03 (H) 05/22/2023   CREATININE 3.97 (H) 05/22/2023   GFRNONAA 16 (L) 05/22/2023   GFRNONAA 16 (L) 05/22/2023   CALCIUM  8.2 (L) 05/22/2023   PHOS 5.0 (H) 05/02/2023   PROT 5.9 (L) 04/30/2023   ALBUMIN  2.1 (L) 05/02/2023   BILITOT 0.8 04/30/2023   ALKPHOS 71 04/30/2023   AST 47 (H) 04/30/2023   ALT 25 04/30/2023   ANIONGAP 11 05/22/2023   Last lipids Lab Results  Component  Value Date   CHOL 78 04/11/2023   HDL 30 (L) 04/11/2023   LDLCALC 7 04/11/2023   LDLDIRECT 134.0 04/25/2011   TRIG 203 (H) 04/11/2023   CHOLHDL 2.6 04/11/2023   Last hemoglobin A1c Lab Results  Component Value Date   HGBA1C 6.6 (A) 03/20/2023   Last thyroid  functions Lab Results  Component Value Date   TSH 3.090 05/02/2023      The ASCVD Risk score (Arnett DK, et al., 2019) failed to calculate for the following reasons:   Risk score cannot be calculated because patient has a medical history suggesting prior/existing ASCVD    Assessment & Plan:   #1 recent acute hypoxic respiratory failure secondary to pulmonary edema in setting of end-stage renal disease and chronic heart failure with reduced ejection fraction.  Recent dietary indiscretion.  Stable at this time.  Lung exam is clear.  O2 sats 99% room air.  Continue close follow-up with nephrology.  They have proposed new dry weight of 82.5 kg.  #2 history of paroxysmal atrial fibrillation.  Currently appears to be sinus rhythm.  Recently Eliquis  held due to GI bleed.  This has now been resumed.  Continue close follow-up with cardiology  #3 history of chronic heart failure with reduced ejection fraction.  Most recent echo EF 40%.  No evidence for volume overload at this time.  Continue current medications and close follow-up with cardiology  #4 recent upper GI bleed secondary to duodenal ulcer.  No evidence for active bleeding.  Recent CBC stable.  Patient now back on Eliquis .  Watch closely for signs of bleeding  #5 history of recent C. difficile colitis.  On vancomycin  oral taper.  No recurrent symptoms.  #6 type 2 diabetes controlled with recent A1c in March of 6.6.  Patient on sliding scale insulin  regimen  Glean Lamy, MD

## 2023-05-25 DIAGNOSIS — D509 Iron deficiency anemia, unspecified: Secondary | ICD-10-CM | POA: Diagnosis not present

## 2023-05-25 DIAGNOSIS — N2581 Secondary hyperparathyroidism of renal origin: Secondary | ICD-10-CM | POA: Diagnosis not present

## 2023-05-25 DIAGNOSIS — Z992 Dependence on renal dialysis: Secondary | ICD-10-CM | POA: Diagnosis not present

## 2023-05-25 DIAGNOSIS — N186 End stage renal disease: Secondary | ICD-10-CM | POA: Diagnosis not present

## 2023-05-25 DIAGNOSIS — N250 Renal osteodystrophy: Secondary | ICD-10-CM | POA: Diagnosis not present

## 2023-05-25 DIAGNOSIS — D631 Anemia in chronic kidney disease: Secondary | ICD-10-CM | POA: Diagnosis not present

## 2023-05-27 DIAGNOSIS — D631 Anemia in chronic kidney disease: Secondary | ICD-10-CM | POA: Diagnosis not present

## 2023-05-27 DIAGNOSIS — Z992 Dependence on renal dialysis: Secondary | ICD-10-CM | POA: Diagnosis not present

## 2023-05-27 DIAGNOSIS — D509 Iron deficiency anemia, unspecified: Secondary | ICD-10-CM | POA: Diagnosis not present

## 2023-05-27 DIAGNOSIS — N250 Renal osteodystrophy: Secondary | ICD-10-CM | POA: Diagnosis not present

## 2023-05-27 DIAGNOSIS — N2581 Secondary hyperparathyroidism of renal origin: Secondary | ICD-10-CM | POA: Diagnosis not present

## 2023-05-27 DIAGNOSIS — N186 End stage renal disease: Secondary | ICD-10-CM | POA: Diagnosis not present

## 2023-05-30 DIAGNOSIS — N2581 Secondary hyperparathyroidism of renal origin: Secondary | ICD-10-CM | POA: Diagnosis not present

## 2023-05-30 DIAGNOSIS — D509 Iron deficiency anemia, unspecified: Secondary | ICD-10-CM | POA: Diagnosis not present

## 2023-05-30 DIAGNOSIS — D631 Anemia in chronic kidney disease: Secondary | ICD-10-CM | POA: Diagnosis not present

## 2023-05-30 DIAGNOSIS — Z992 Dependence on renal dialysis: Secondary | ICD-10-CM | POA: Diagnosis not present

## 2023-05-30 DIAGNOSIS — N186 End stage renal disease: Secondary | ICD-10-CM | POA: Diagnosis not present

## 2023-05-30 DIAGNOSIS — N250 Renal osteodystrophy: Secondary | ICD-10-CM | POA: Diagnosis not present

## 2023-06-01 DIAGNOSIS — D631 Anemia in chronic kidney disease: Secondary | ICD-10-CM | POA: Diagnosis not present

## 2023-06-01 DIAGNOSIS — D509 Iron deficiency anemia, unspecified: Secondary | ICD-10-CM | POA: Diagnosis not present

## 2023-06-01 DIAGNOSIS — N186 End stage renal disease: Secondary | ICD-10-CM | POA: Diagnosis not present

## 2023-06-01 DIAGNOSIS — N2581 Secondary hyperparathyroidism of renal origin: Secondary | ICD-10-CM | POA: Diagnosis not present

## 2023-06-01 DIAGNOSIS — N250 Renal osteodystrophy: Secondary | ICD-10-CM | POA: Diagnosis not present

## 2023-06-01 DIAGNOSIS — Z992 Dependence on renal dialysis: Secondary | ICD-10-CM | POA: Diagnosis not present

## 2023-06-03 DIAGNOSIS — D631 Anemia in chronic kidney disease: Secondary | ICD-10-CM | POA: Diagnosis not present

## 2023-06-03 DIAGNOSIS — N2581 Secondary hyperparathyroidism of renal origin: Secondary | ICD-10-CM | POA: Diagnosis not present

## 2023-06-03 DIAGNOSIS — N250 Renal osteodystrophy: Secondary | ICD-10-CM | POA: Diagnosis not present

## 2023-06-03 DIAGNOSIS — D509 Iron deficiency anemia, unspecified: Secondary | ICD-10-CM | POA: Diagnosis not present

## 2023-06-03 DIAGNOSIS — N186 End stage renal disease: Secondary | ICD-10-CM | POA: Diagnosis not present

## 2023-06-03 DIAGNOSIS — Z992 Dependence on renal dialysis: Secondary | ICD-10-CM | POA: Diagnosis not present

## 2023-06-06 DIAGNOSIS — N250 Renal osteodystrophy: Secondary | ICD-10-CM | POA: Diagnosis not present

## 2023-06-06 DIAGNOSIS — D509 Iron deficiency anemia, unspecified: Secondary | ICD-10-CM | POA: Diagnosis not present

## 2023-06-06 DIAGNOSIS — Z992 Dependence on renal dialysis: Secondary | ICD-10-CM | POA: Diagnosis not present

## 2023-06-06 DIAGNOSIS — D631 Anemia in chronic kidney disease: Secondary | ICD-10-CM | POA: Diagnosis not present

## 2023-06-06 DIAGNOSIS — N186 End stage renal disease: Secondary | ICD-10-CM | POA: Diagnosis not present

## 2023-06-06 DIAGNOSIS — N2581 Secondary hyperparathyroidism of renal origin: Secondary | ICD-10-CM | POA: Diagnosis not present

## 2023-06-08 DIAGNOSIS — N2581 Secondary hyperparathyroidism of renal origin: Secondary | ICD-10-CM | POA: Diagnosis not present

## 2023-06-08 DIAGNOSIS — D509 Iron deficiency anemia, unspecified: Secondary | ICD-10-CM | POA: Diagnosis not present

## 2023-06-08 DIAGNOSIS — N250 Renal osteodystrophy: Secondary | ICD-10-CM | POA: Diagnosis not present

## 2023-06-08 DIAGNOSIS — N186 End stage renal disease: Secondary | ICD-10-CM | POA: Diagnosis not present

## 2023-06-08 DIAGNOSIS — Z992 Dependence on renal dialysis: Secondary | ICD-10-CM | POA: Diagnosis not present

## 2023-06-08 DIAGNOSIS — D631 Anemia in chronic kidney disease: Secondary | ICD-10-CM | POA: Diagnosis not present

## 2023-06-09 ENCOUNTER — Ambulatory Visit: Attending: Nurse Practitioner | Admitting: Nurse Practitioner

## 2023-06-09 ENCOUNTER — Encounter: Payer: Self-pay | Admitting: Nurse Practitioner

## 2023-06-09 VITALS — BP 122/72 | HR 60 | Ht 70.0 in | Wt 191.0 lb

## 2023-06-09 DIAGNOSIS — I6523 Occlusion and stenosis of bilateral carotid arteries: Secondary | ICD-10-CM | POA: Diagnosis not present

## 2023-06-09 DIAGNOSIS — I2510 Atherosclerotic heart disease of native coronary artery without angina pectoris: Secondary | ICD-10-CM | POA: Insufficient documentation

## 2023-06-09 DIAGNOSIS — I739 Peripheral vascular disease, unspecified: Secondary | ICD-10-CM | POA: Insufficient documentation

## 2023-06-09 DIAGNOSIS — I951 Orthostatic hypotension: Secondary | ICD-10-CM | POA: Diagnosis not present

## 2023-06-09 DIAGNOSIS — I4891 Unspecified atrial fibrillation: Secondary | ICD-10-CM | POA: Diagnosis not present

## 2023-06-09 DIAGNOSIS — N186 End stage renal disease: Secondary | ICD-10-CM | POA: Insufficient documentation

## 2023-06-09 DIAGNOSIS — I10 Essential (primary) hypertension: Secondary | ICD-10-CM | POA: Diagnosis not present

## 2023-06-09 DIAGNOSIS — Z992 Dependence on renal dialysis: Secondary | ICD-10-CM | POA: Diagnosis not present

## 2023-06-09 DIAGNOSIS — I5022 Chronic systolic (congestive) heart failure: Secondary | ICD-10-CM | POA: Insufficient documentation

## 2023-06-09 MED ORDER — CARVEDILOL 6.25 MG PO TABS: 6.2500 mg | ORAL_TABLET | Freq: Two times a day (BID) | ORAL | 1 refills | Status: DC

## 2023-06-09 MED ORDER — ABDOMINAL BINDER/ELASTIC MED MISC: 1.0000 | Freq: Every day | 0 refills | Status: DC

## 2023-06-09 NOTE — Patient Instructions (Addendum)
 Medication Instructions:  Your physician has recommended you make the following change in your medication:  Please reduce Coreg  to 6.25 Mg Twice daily   Labwork: In 1 week at [facility]  Testing/Procedures: None   Follow-Up: Your physician recommends that you schedule a follow-up appointment in: 3 months   Any Other Special Instructions Will Be Listed Below (If Applicable).  If you need a refill on your cardiac medications before your next appointment, please call your pharmacy.

## 2023-06-09 NOTE — Progress Notes (Addendum)
 Cardiology Office Note:  .   Date: 06/09/2023 ID:  Anthony Bullock, DOB 20-Jul-1960, MRN [id number] PCP: Marquetta Sit, MD  [facility] Providers Cardiologist:  Armida Lander, MD Electrophysiologist:  Richardo Chandler, MD    History of Present Illness: .   Anthony Bullock is a 63 y.o. male with a PMH of chronic systolic CHF, s/p ICD/ NICM, CAD, COPD, PAD, s/p bilateral BKA, hyperlipidemia, hypertension, type 2 diabetes, mild bilateral carotid artery stenosis, ESRD on HD, who presents today for hospital follow-up.  Echocardiogram in 2015 revealed EF 25%.  Cardiac catheterization in 2015 revealed diffuse nonobstructive disease, worst lesion 80% small distal LAD.  Echocardiogram in EF improved to 50 to 55% in 2017.  Has had some of his heart failure medications lowered due to low BPs on hemodialysis.  Fluid status managed by hemodialysis.  Last seen by Dr. Armida Lander on Jun 02, 2022.  Was overall doing well at that time.  Medical therapy was noted to be limited due to history of low BPs during hemodialysis as well as history of dizziness.  In the interim, he has had several ED visits and admissions. 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 Neurology consulted. Recommended to continue Aspirin , statin, and Plavix , and follow-up with Neuro outpatient. Echo and Cardiac event monitoring also recommended.   Hospital admission 08/18/2022 to 08/26/2022 for acute respiratory failure secondary to volume overload.  Required extra HD sessions.  Hospital course also prolonged secondary to new onset A-fib with new diagnosis of cardiomyopathy with EF 30 to 35%.  Cardiology was consulted.  Was on diltiazem  drip temporarily, converted to sinus rhythm.  GDMT limited due to low BPs.  Discharge home on carvedilol  and Eliquis .  Readmitted end of August 2024 for acute respiratory failure with hypoxia, multifactorial in  setting of COVID-19 infection, pneumonia, and fluid overload.  BiPAP use to stabilize breathing.  EF 30 to 35% as seen from 08/22/2022.  GDMT limited by low BP.  Hospital course noted by acute A-fib with RVR most likely due to acute infection of COVID and pneumonia, transitioned off amiodarone  drip, converted to sinus rhythm.  ED visit 09/24/2022 for shortness of breath.  EMS reported hypoxia with a heart rate 150s initially.  Was placed on CPAP heart rate came down to 110.  Patient denied chest pain.  Chest x-ray did show evidence of volume overload.  Venous gas did show some element of hypoxia. Troponins 43 >> 49. Did require BiPAP in ED, weaned to oxygen  via [place]. Stated he was due for dialysis that day and would need dialysis session then.  He arrived back from dialysis, patient discharged back home.  10/11/2022 - Today he presents for follow-up. He states he is doing well. Denies any chest pain, shortness of breath, palpitations, syncope, presyncope, dizziness, orthopnea, PND, swelling or significant weight changes, acute bleeding, or claudication.   12/14/2022 -today presents for follow-up.  He is doing well.  Denies any acute cardiac complaints or issues. Denies any chest pain, shortness of breath, palpitations, syncope, presyncope, dizziness, orthopnea, PND, swelling or significant weight changes, acute bleeding, or claudication.  Tells me he has obtained Kardia mobile device that has consistently been showing normal sinus rhythm.  Hospitalized earlier in 2025 with NSTEMI.  Transferred to [facility] for cardiac catheterization and on April 2 he underwent stenting of LAD and first diagonal.  On April 4 he underwent successful PCI  to the RCA.  Recommended extended DAPT for greater than 12 months given his extensive PCI of LAD and RCA, also to remain on Eliquis  for anticoagulation.  Hospitalized later in April 2025 with acute blood loss anemia.  Found to have a hemoglobin of 5.4 with positive Hemoccult.   Hospital course also noted by C. difficile and hypotension.  Acute blood loss anemia is related to upper GI bleed.  EGD noted with superficial duodenal ulcers, bleeding appeared to resolve, plan was to discontinue Eliquis  with recommendations to hold until outpatient cardiology follow-up.  Hospitalized in May 2025 and was found to have acute hypoxic respiratory failure secondary to pulmonary edema requiring BiPAP and underwent emergent hemodialysis.  Did admit to dietary indiscretion with excessive fluid and salt intake over the weekend given Mother's Day weekend.  He was noted to have chest pain that was suspect secondary to pulmonary edema, troponins are minimally elevated/flat.  Chest pain did resolve.  Dr. Theodis Fiscal recommended continue medicines and follow-up with outpatient cardiology.  Eliquis  was resumed.  Today he presents for follow-up. Tells me he is taking Calcium  Acetate 667 capsule - currently taking 3 capsules 3 times a day with meals and 1 capsule with snacks. Taking midodrine  that is helping his BP but still does notice some BP drops and is symptomatic with this. Denies any chest pain, shortness of breath, palpitations, syncope, orthopnea, PND, swelling or significant weight changes, acute bleeding, or claudication.  ROS: Negative. See HPI.   Studies Reviewed: Aaron Aas    EKG: EKG is not ordered today.   Coronary stent intervention 04/2023:    LESION #1: Prox RCA to Mid RCA lesion is 80% stenosed.   Scoring balloon angioplasty was performed using a BALLN SCOREFLEX 3.50X15. Followed by shockwave lithotripsy with a 3.5 mm 12 mm shockwave balloon   A drug-eluting stent was successfully placed using a SYNERGY XD 3.50X28 => postdilated in the majority of stent to 3.8-3.9 mm. Post intervention, there is a 5% residual stenosis in the tightest segment, but the remainder of the stent is 0% stenosed.  TIMI-3 flow maintained   --------------------------------------------   LESION #2 Ost RCA lesion is  80% stenosed.   Scoring balloon angioplasty was performed using a BALLN SCOREFLEX 3.50X15.  Followed by shockwave lithotripsy with a 3.5 mm 12 mm shockwave balloon   A drug-eluting stent was successfully placed using a SYNERGY XD 3.50X16 postdilated from 4.2 to 4.0 mm.  Post intervention, there is a 0% residual stenosis.  TIMI-3 flow maintained   Diagnostic  Dominance: Right                                                   Intervention  Successful 2 site PCI of the RCA using score flex angioplasty followed by shockwave lithotripsy for lesion modification: Mid RCA 80% reduced to 0% with exception of focal 5% (Synergy XD 3.5 mm x 28 mm postdilated to 3.8 mm); ostial RCA 80% reduced to 0% (Synergy XD 3.5 mm 16mm postdilated in tapered fashion from 4.2 to 3.9 mm)    RECOMMENDATIONS   In the absence of any other complications or medical issues, we expect the patient to be ready for discharge from an interventional cardiology perspective on 04/15/2023.   Continue to titrate GDMT   Recommend dual antiplatelet therapy with Aspirin  81mg  daily and Clopidogrel  75mg  daily long-term (  beyond 12 months) because of Extensive PCI to both the LAD and RCA.   Would continue DAPT for 1 year and then after 1 year would continue long-term clopidogrel   LHC 04/2023:    Ost RCA lesion is 80% stenosed.   Prox RCA to Mid RCA lesion is 80% stenosed.   Ost LM lesion is 45% stenosed.   Prox LAD to Mid LAD lesion is 85% stenosed.   1st Diag lesion is 95% stenosed.   1st Mrg lesion is 100% stenosed.   Dist Cx-1 lesion is 85% stenosed.   Dist Cx-2 lesion is 100% stenosed.   A drug-eluting stent was successfully placed using a SYNERGY XD 2.50X12 in the side branch.   A drug-eluting stent was successfully placed using a SYNERGY XD 2.50X32 in the main branch.   Angioplasty was performed in the main branch. .   Angioplasty was performed in the side branch. .   Post intervention, there is a 0% residual stenosis.   Post  intervention, there is a 0% residual stenosis.   LV end diastolic pressure is normal.   Recommend to resume Apixaban , at currently prescribed dose and frequency.   Recommend concurrent antiplatelet therapy of Aspirin  81 mg for 1 month and Clopidogrel  75mg  daily for 12 months .   Severe 3 vessel obstructive CAD. Suspect culprit vessel is the first OM which is occluded Normal LVEDP Successful bifurcation stenting of the LAD/first diagonal with 2.5 x 12 mm Synergy stent in the diagonal and 2.5 x 32 mm Synergy stent in the LAD   Plan: DAPT with ASA 81 mg daily for one month. Plavix  75 mg daily for 12 months for ACS. Anticipate resuming Eliquis  once procedures are completed. Recommend return for staged PCI of the RCA at the ostium and mid vessel. Would treat LCx disease medically.   Echo 04/2023:  1. Left ventricular ejection fraction, by estimation, is 40%. The left  ventricle has mildly decreased function. The left ventricle demonstrates  global hypokinesis. There is moderate left ventricular hypertrophy. Left  ventricular diastolic parameters are   consistent with Grade I diastolic dysfunction (impaired relaxation).  Elevated left atrial pressure.   2. Right ventricular systolic function is normal. The right ventricular  size is normal.   3. The mitral valve is normal in structure. No evidence of mitral valve  regurgitation. No evidence of mitral stenosis.   4. The aortic valve is tricuspid. Aortic valve regurgitation is not  visualized. No aortic stenosis is present.   5. The inferior vena cava is normal in size with greater than 50%  respiratory variability, suggesting right atrial pressure of 3 mmHg.  Carotid duplex 12/2022:  Summary:  Right Carotid: Velocities in the right ICA are consistent with a 1-39%  stenosis. Non-hemodynamically significant plaque <50% noted in the  CCA. The  ECA appears <50% stenosed.   Left Carotid: Velocities in the left ICA are consistent with a 1-39%   stenosis. Non-hemodynamically significant plaque <50% noted in the  CCA. The ECA appears <50% stenosed.   Vertebrals:  Right vertebral artery demonstrates antegrade flow. Left  vertebral artery demonstrates retrograde flow.  Subclavians: Normal flow hemodynamics were seen in the right subclavian artery. Left subclavian artery has-Monophasic flow.   *See table(s) above for measurements and observations.  Suggest follow up study in 12 months. Limited echo 11/2022:  1. Left ventricular ejection fraction, by estimation, is 40%. The left  ventricle has mildly decreased function. The left ventricle demonstrates  global hypokinesis. There is  moderate left ventricular hypertrophy. Left  ventricular diastolic parameters are   consistent with Grade I diastolic dysfunction (impaired relaxation).   2. Right ventricular systolic function is normal. The right ventricular  size is normal.   3. Limited echo to evaluate LV function   Comparison(s): EF 30-35%. Moderate concentric LVH. Moderate mitral regurgitation.  Lexiscan  08/2022:  IMPRESSION: 1. There is a large in size, severe, fixed defect involving the basal inferolateral, mid inferolateral, mid anterolateral and apical lateral segments. This is consistent with infarction involving the left circumflex coronary artery.   2. Globally diminished left ventricular wall motion and left ventricular dilatation.   3. Left ventricular ejection fraction 18%   4. Non invasive risk stratification*: High  Echo 08/2022:  1. Left ventricular ejection fraction, by estimation, is 30 to 35%. The  left ventricle has moderately decreased function. The left ventricle  demonstrates global hypokinesis. There is moderate concentric left  ventricular hypertrophy. Left ventricular  diastolic parameters are consistent with Grade II diastolic dysfunction  (pseudonormalization). Elevated left ventricular end-diastolic pressure.   2. Right ventricular systolic  function is normal. The right ventricular  size is normal.   3. Left atrial size was mildly dilated.   4. The mitral valve is normal in structure. Moderate mitral valve  regurgitation. No evidence of mitral stenosis.   5. The aortic valve is normal in structure. Aortic valve regurgitation is  not visualized. Aortic valve sclerosis is present, with no evidence of  aortic valve stenosis.   6. The inferior vena cava is normal in size with greater than 50%  respiratory variability, suggesting right atrial pressure of 3 mmHg.   Carotid duplex 07/2021:  Summary:  Right Carotid: Velocities in the right ICA are consistent with a 1-39%  stenosis.   Left Carotid: Velocities in the left ICA are consistent with a 1-39%  stenosis. Non-hemodynamically significant plaque <50% noted in the CCA. The ECA appears >50% stenosed.   Vertebrals:  Right vertebral artery demonstrates antegrade flow. Left  vertebral artery demonstrates retrograde flow.  Subclavians: Left subclavian artery flow was disturbed. Normal flow  hemodynamics were seen in the right subclavian artery.  ABI's 10/2019: Summary:  Left: Resting left ankle-brachial index indicates noncompressible left  lower extremity arteries. The left toe-brachial index is abnormal. LT  Great toe pressure = 25 mmHg. Risk Assessment/Calculations:    CHA2DS2-VASc Score = 4   This indicates a 4.8% annual risk of stroke. The patient's score is based upon: CHF History: 1 HTN History: 1 Diabetes History: 1 Stroke History: 0 Vascular Disease History: 1 Age Score: 0 Gender Score: 0  Physical Exam:   VS:  BP 122/72 (BP Location: Right Arm, Cuff Size: Normal)   Pulse 60   Ht 5\' 10"  (1.778 m)   Wt 191 lb (86.6 kg)   SpO2 91%   BMI 27.41 kg/m    Wt Readings from Last 3 Encounters:  06/09/23 191 lb (86.6 kg)  05/24/23 187 lb 11.2 oz (85.1 kg)  05/21/23 195 lb (88.5 kg)    GEN: Well nourished, well developed in no acute distress NECK: No JVD;  right carotid bruit, no left carotid bruit CARDIAC: S1/S2, RRR, no murmurs, rubs, gallops RESPIRATORY:  Clear to auscultation without rales, wheezing or rhonchi  ABDOMEN: Soft, non-tender, non-distended EXTREMITIES:  No edema; bilateral BKA  ASSESSMENT AND PLAN: .    Chronic systolic CHF Stage C, NYHA class I symptoms. EF 08/2022 30-35%, reduced in setting of acute illness/A-fib with RVR, limited echo last  month revealed EF 40%.  Most recent EF in April 2025 was stable at 40%. GDMT limited d/t soft BP and ESRD.  See medication changes noted below-continue rest of medication regimen. Euvolemic and well compensated on exam. Weights are stable. Low sodium diet, fluid restriction <2L, and daily weights encouraged. Educated to contact our office for weight gain of 2 lbs overnight or 5 lbs in one week.   CAD Stable with no anginal symptoms. No indication for ischemic evaluation.  See past cardiac catheterizations from April 2025 noted above.  Past Lexiscan  showed infarct, negative for ischemia.  See medication changes noted below-continue rest of medication regimen. Heart healthy diet and regular cardiovascular exercise encouraged.   A-fib Denies any recent tachycardia or palpitations. HR well controlled today. Reducing Coreg . Continue Eliquis  for CHA2DS2-VASc score of 4. Heart healthy diet and regular cardiovascular exercise encouraged.  Will obtain thyroid  panel and CMET as he is on amiodarone .   4. HTN, orthostatic hypotension, ESRD on HD Past issues with low BP's during HD.  Admits to occasional BP drops and sounds symptomatic.  Continue midodrine .  Will write Rx for abdominal binder.  He verbalized understanding of instructions regarding this.  Discussed to monitor BP at home at least 2 hours after medications and sitting for 5-10 minutes.  Care and ED precautions discussed.  In the meantime, will reduce carvedilol  to 6.25 mg twice daily to improve symptoms.   5. PAD, s/p BKA, carotid artery  disease Previous history of mild carotid artery stenosis by carotid duplex.  Most recent duplex from December 2024 showed stable findings. Continue current medication regimen. Heart healthy diet and regular cardiovascular exercise encouraged.   Dispo: Care and ED precautions discussed.  Follow-up with Dr. Armida Lander or APP in 3 months or sooner if anything changes.   Signed, Lasalle Pointer, NP

## 2023-06-10 DIAGNOSIS — D509 Iron deficiency anemia, unspecified: Secondary | ICD-10-CM | POA: Diagnosis not present

## 2023-06-10 DIAGNOSIS — N186 End stage renal disease: Secondary | ICD-10-CM | POA: Diagnosis not present

## 2023-06-10 DIAGNOSIS — Z992 Dependence on renal dialysis: Secondary | ICD-10-CM | POA: Diagnosis not present

## 2023-06-10 DIAGNOSIS — N250 Renal osteodystrophy: Secondary | ICD-10-CM | POA: Diagnosis not present

## 2023-06-10 DIAGNOSIS — D631 Anemia in chronic kidney disease: Secondary | ICD-10-CM | POA: Diagnosis not present

## 2023-06-10 DIAGNOSIS — N2581 Secondary hyperparathyroidism of renal origin: Secondary | ICD-10-CM | POA: Diagnosis not present

## 2023-06-11 DIAGNOSIS — N250 Renal osteodystrophy: Secondary | ICD-10-CM | POA: Diagnosis not present

## 2023-06-11 DIAGNOSIS — N2581 Secondary hyperparathyroidism of renal origin: Secondary | ICD-10-CM | POA: Diagnosis not present

## 2023-06-11 DIAGNOSIS — N186 End stage renal disease: Secondary | ICD-10-CM | POA: Diagnosis not present

## 2023-06-11 DIAGNOSIS — D631 Anemia in chronic kidney disease: Secondary | ICD-10-CM | POA: Diagnosis not present

## 2023-06-11 DIAGNOSIS — D509 Iron deficiency anemia, unspecified: Secondary | ICD-10-CM | POA: Diagnosis not present

## 2023-06-11 DIAGNOSIS — Z992 Dependence on renal dialysis: Secondary | ICD-10-CM | POA: Diagnosis not present

## 2023-06-13 ENCOUNTER — Telehealth: Payer: Self-pay | Admitting: Family Medicine

## 2023-06-13 DIAGNOSIS — N2581 Secondary hyperparathyroidism of renal origin: Secondary | ICD-10-CM | POA: Diagnosis not present

## 2023-06-13 DIAGNOSIS — D509 Iron deficiency anemia, unspecified: Secondary | ICD-10-CM | POA: Diagnosis not present

## 2023-06-13 DIAGNOSIS — N186 End stage renal disease: Secondary | ICD-10-CM | POA: Diagnosis not present

## 2023-06-13 DIAGNOSIS — D631 Anemia in chronic kidney disease: Secondary | ICD-10-CM | POA: Diagnosis not present

## 2023-06-13 DIAGNOSIS — Z992 Dependence on renal dialysis: Secondary | ICD-10-CM | POA: Diagnosis not present

## 2023-06-13 MED ORDER — PANTOPRAZOLE SODIUM 40 MG PO TBEC
40.0000 mg | DELAYED_RELEASE_TABLET | Freq: Two times a day (BID) | ORAL | 1 refills | Status: DC
Start: 2023-06-13 — End: 2023-09-20

## 2023-06-13 NOTE — Telephone Encounter (Signed)
 Copied from CRM [id number]. Topic: Clinical - Medication Refill >> Jun 13, 2023  9:59 AM Howard Macho wrote: Medication: pantoprazole  (PROTONIX ) 40 MG tablet  Has the patient contacted their pharmacy? No (Agent: If no, request that the patient contact the pharmacy for the refill. If patient does not wish to contact the pharmacy document the reason why and proceed with request.) (Agent: If yes, when and what did the pharmacy advise?)  This is the patient's preferred pharmacy:  [id number] - [place], [place] - [address] [place] [address] Phone: [phone] Fax: [phone]  Is this the correct pharmacy for this prescription? Yes If no, delete pharmacy and type the correct one.   Has the prescription been filled recently? No  Is the patient out of the medication? Yes  Has the patient been seen for an appointment in the last year OR does the patient have an upcoming appointment? Yes  Can we respond through MyChart? Yes  Agent: Please be advised that Rx refills may take up to 3 business days. We ask that you follow-up with your pharmacy.

## 2023-06-14 ENCOUNTER — Telehealth: Payer: Self-pay | Admitting: Nurse Practitioner

## 2023-06-14 NOTE — Telephone Encounter (Signed)
 Left patient a detailed message regarding this that I have sent lab orders to pcp office per patient request

## 2023-06-14 NOTE — Telephone Encounter (Signed)
 Pt said Anthony Bullock told him to go to [facility] to have his labs drawn. They told him because he is not a patient there they could not draw his labs. Pt is going to his PCP office Monday and wants to have order sent there to have it done.

## 2023-06-15 DIAGNOSIS — N2581 Secondary hyperparathyroidism of renal origin: Secondary | ICD-10-CM | POA: Diagnosis not present

## 2023-06-15 DIAGNOSIS — D509 Iron deficiency anemia, unspecified: Secondary | ICD-10-CM | POA: Diagnosis not present

## 2023-06-15 DIAGNOSIS — D631 Anemia in chronic kidney disease: Secondary | ICD-10-CM | POA: Diagnosis not present

## 2023-06-15 DIAGNOSIS — Z992 Dependence on renal dialysis: Secondary | ICD-10-CM | POA: Diagnosis not present

## 2023-06-15 DIAGNOSIS — N186 End stage renal disease: Secondary | ICD-10-CM | POA: Diagnosis not present

## 2023-06-17 DIAGNOSIS — Z992 Dependence on renal dialysis: Secondary | ICD-10-CM | POA: Diagnosis not present

## 2023-06-17 DIAGNOSIS — D509 Iron deficiency anemia, unspecified: Secondary | ICD-10-CM | POA: Diagnosis not present

## 2023-06-17 DIAGNOSIS — N2581 Secondary hyperparathyroidism of renal origin: Secondary | ICD-10-CM | POA: Diagnosis not present

## 2023-06-17 DIAGNOSIS — N186 End stage renal disease: Secondary | ICD-10-CM | POA: Diagnosis not present

## 2023-06-17 DIAGNOSIS — D631 Anemia in chronic kidney disease: Secondary | ICD-10-CM | POA: Diagnosis not present

## 2023-06-19 ENCOUNTER — Telehealth: Payer: Self-pay

## 2023-06-19 ENCOUNTER — Ambulatory Visit (INDEPENDENT_AMBULATORY_CARE_PROVIDER_SITE_OTHER): Admitting: Family Medicine

## 2023-06-19 DIAGNOSIS — E785 Hyperlipidemia, unspecified: Secondary | ICD-10-CM

## 2023-06-19 DIAGNOSIS — E1142 Type 2 diabetes mellitus with diabetic polyneuropathy: Secondary | ICD-10-CM

## 2023-06-19 DIAGNOSIS — Z79899 Other long term (current) drug therapy: Secondary | ICD-10-CM

## 2023-06-19 DIAGNOSIS — Z794 Long term (current) use of insulin: Secondary | ICD-10-CM | POA: Diagnosis not present

## 2023-06-19 LAB — COMPREHENSIVE METABOLIC PANEL WITH GFR
ALT: 30 U/L (ref 0–53)
AST: 23 U/L (ref 0–37)
Albumin: 3.7 g/dL (ref 3.5–5.2)
Alkaline Phosphatase: 201 U/L — ABNORMAL HIGH (ref 39–117)
BUN: 52 mg/dL — ABNORMAL HIGH (ref 6–23)
CO2: 27 meq/L (ref 19–32)
Calcium: 9 mg/dL (ref 8.4–10.5)
Chloride: 96 meq/L (ref 96–112)
Creatinine, Ser: 10.14 mg/dL (ref 0.40–1.50)
GFR: 5.03 mL/min — CL (ref >60.00)
Glucose, Bld: 153 mg/dL — ABNORMAL HIGH (ref 70–99)
Potassium: 4 meq/L (ref 3.5–5.1)
Sodium: 137 meq/L (ref 135–145)
Total Bilirubin: 0.4 mg/dL (ref 0.2–1.2)
Total Protein: 7.1 g/dL (ref 6.0–8.3)

## 2023-06-19 LAB — POCT GLYCOSYLATED HEMOGLOBIN (HGB A1C): Hemoglobin A1C: 5.8 % — AB (ref 4.0–5.6)

## 2023-06-19 NOTE — Patient Instructions (Signed)
 A1C today is excellent at 5.8%.

## 2023-06-19 NOTE — Telephone Encounter (Signed)
 Patient has end-stage renal disease on hemodialysis.  This is his usual creatinine and GFR  Marquetta Sit MD [facility]

## 2023-06-19 NOTE — Progress Notes (Signed)
 Established Patient Office Visit  Subjective   Patient ID: Anthony Bullock, male    DOB: 1960/05/29  Age: 63 y.o. MRN: [id number]  Chief Complaint  Patient presents with   Medical Management of Chronic Issues    HPI   Anthony Bullock is seen for medical follow-up.  Has had multiple admissions this past year with several secondary to volume overload.  Back in early April he had non-ST elevation MI.  Denies recent chest pains.  Also had recent C. difficile colitis and is finishing long taper of vancomycin .  No diarrhea currently.  He is requesting labs today.  He saw cardiologist recently and they requested TSH and CMP secondary to his amiodarone  therapy.  He is on high-dose statin with atorvastatin  80 mg daily.  Cholesterol early April was total of 78 with LDL of 7.  Regarding his diabetes this has been gradually and steadily improving.  He was 8.7 a little over a year ago then down to 7.3 and then 6.6 and now 5.8.  Rare hypoglycemic episodes.  He attributes vast improvement to continuous glucose monitor.  Watching diet much more closely.  Remains on 14 units of Lantus  daily and takes Humalog  sliding scale.  Fasting blood sugars fairly consistently 130-150.  Past Medical History:  Diagnosis Date   AICD (automatic cardioverter/defibrillator) present    [employer]   Allergic rhinitis    Anemia    Arthritis    Chronic systolic heart failure (HCC)    a. ECHO (12/2012) EF 25-30%, HK entireanteroseptal myocardium //  b.  EF 25%, diffuse HK, grade 1 diastolic dysfunction, MAC, mild LAE, normal RVSF, trivial pericardial effusion   COPD (chronic obstructive pulmonary disease) (HCC)    Diabetes mellitus type II    Diabetic nephropathy (HCC)    Diabetic neuropathy (HCC)    ESRD on hemodialysis (HCC)    started HD June 2017, goes to [facility] HD unit, Dr Lieutenant Reese   History of cardiac catheterization    a.Myoview  1/15:  There is significant left ventricular dysfunction. There may be slight scar  at the apex. There is no significant ischemia. LV Ejection Fraction: 27%  //  b. RHC/LHC (1/15) with mean RA 6, PA 47/22 mean 33, mean PCWP 20, PVR 2.5 WU, CI 2.5; 80% dLAD stenosis, 70% diffuse large D.     History of kidney stones    Hyperlipidemia    Hypertension    Kidney stones    NICM (nonischemic cardiomyopathy) (HCC)    Primarily nonischemic.  Echo (12/14) with EF 25-30%.  Echo (3/15) with EF 25%, mild to moderately dilated LV, normal RV size and systolic function.     Osteomyelitis (HCC)    left fifth ray   Pneumonia    Urethral stricture    Wears glasses    Past Surgical History:  Procedure Laterality Date   ABDOMINAL AORTOGRAM W/LOWER EXTREMITY N/A 03/30/2016   Procedure: Abdominal Aortogram w/Lower Extremity;  Surgeon: Dannis Dy, MD;  Location: [facility] INVASIVE CV LAB;  Service: Cardiovascular;  Laterality: N/A;   AMPUTATION Right 04/26/2016   Procedure: Right Below Knee Amputation;  Surgeon: Timothy Ford, MD;  Location: [facility] OR;  Service: Orthopedics;  Laterality: Right;   AMPUTATION Left 08/21/2019   Procedure: LEFT FOOT 5TH RAY AMPUTATION;  Surgeon: Timothy Ford, MD;  Location: [facility] OR;  Service: Orthopedics;  Laterality: Left;   AMPUTATION Left 11/13/2019   Procedure: LEFT BELOW KNEE AMPUTATION;  Surgeon: Timothy Ford, MD;  Location: [facility] OR;  Service: Orthopedics;  Laterality: Left;   AV FISTULA PLACEMENT Right 09/08/2015   Procedure: INSERTION OF 4-62mm x 45cm  ARTERIOVENOUS (AV) GORE-TEX GRAFT RIGHT UPPER  ARM;  Surgeon: Dannis Dy, MD;  Location: [facility] OR;  Service: Vascular;  Laterality: Right;   AV FISTULA PLACEMENT Left 01/14/2016   Procedure: CREATION OF LEFT UPPER ARM ARTERIOVENOUS FISTULA;  Surgeon: Dannis Dy, MD;  Location: [facility] OR;  Service: Vascular;  Laterality: Left;   BASCILIC VEIN TRANSPOSITION Right 08/22/2014   Procedure: RIGHT UPPER ARM BASCILIC VEIN TRANSPOSITION;  Surgeon: Dannis Dy, MD;  Location: [facility] OR;  Service: Vascular;   Laterality: Right;   BELOW KNEE LEG AMPUTATION Right 04/26/2016   BIOPSY OF SKIN SUBCUTANEOUS TISSUE AND/OR MUCOUS MEMBRANE  04/28/2023   Procedure: BIOPSY, SKIN, SUBCUTANEOUS TISSUE, OR MUCOUS MEMBRANE;  Surgeon: Sergio Dandy, MD;  Location: [facility] ENDOSCOPY;  Service: Gastroenterology;;   CARDIAC CATHETERIZATION     CARDIAC DEFIBRILLATOR PLACEMENT  06/27/2013   Sub Q       BY DR Rodolfo Clan   CATARACT EXTRACTION W/PHACO Right 08/06/2018   Procedure: CATARACT EXTRACTION PHACO AND INTRAOCULAR LENS PLACEMENT (IOC);  Surgeon: Tarri Farm, MD;  Location: [facility] ORS;  Service: Ophthalmology;  Laterality: Right;  CDE: 4.06   CATARACT EXTRACTION W/PHACO Left 08/20/2018   Procedure: CATARACT EXTRACTION PHACO AND INTRAOCULAR LENS PLACEMENT (IOC);  Surgeon: Tarri Farm, MD;  Location: [facility] ORS;  Service: Ophthalmology;  Laterality: Left;  CDE: 6.76   COLONOSCOPY WITH PROPOFOL  N/A 07/22/2015   Procedure: COLONOSCOPY WITH PROPOFOL ;  Surgeon: Albertina Hugger, MD;  Location: [facility] ENDOSCOPY;  Service: Gastroenterology;  Laterality: N/A;   CORONARY LITHOTRIPSY N/A 04/14/2023   Procedure: CORONARY LITHOTRIPSY;  Surgeon: Arleen Lacer, MD;  Location: [facility] INVASIVE CV LAB;  Service: Cardiovascular;  Laterality: N/A;  RCA   CORONARY STENT INTERVENTION N/A 04/12/2023   Procedure: CORONARY STENT INTERVENTION;  Surgeon: Swaziland, Peter M, MD;  Location: [facility] INVASIVE CV LAB;  Service: Cardiovascular;  Laterality: N/A;   CORONARY STENT INTERVENTION N/A 04/14/2023   Procedure: CORONARY STENT INTERVENTION;  Surgeon: Arleen Lacer, MD;  Location: [facility] INVASIVE CV LAB;  Service: Cardiovascular;  Laterality: N/A;   ESOPHAGOGASTRODUODENOSCOPY N/A 04/28/2023   Procedure: EGD (ESOPHAGOGASTRODUODENOSCOPY);  Surgeon: Nandigam, Kavitha V, MD;  Location: [facility] ENDOSCOPY;  Service: Gastroenterology;  Laterality: N/A;   FEMORAL-POPLITEAL BYPASS GRAFT Right 03/31/2016   Procedure: BYPASS GRAFT FEMORAL-POPLITEAL ARTERY USING RIGHT GREATER SAPHENOUS  NONREVERSED VEIN;  Surgeon: Dannis Dy, MD;  Location: [facility] OR;  Service: Vascular;  Laterality: Right;   HERNIA REPAIR     I & D EXTREMITY Right 03/31/2016   Procedure: IRRIGATION AND DEBRIDEMENT FOOT;  Surgeon: Dannis Dy, MD;  Location: [facility] OR;  Service: Vascular;  Laterality: Right;   IMPLANTABLE CARDIOVERTER DEFIBRILLATOR IMPLANT N/A 06/27/2013   Procedure: SUB Q ICD;  Surgeon: Verona Goodwill, MD;  Location: [facility] CATH LAB;  Service: Cardiovascular;  Laterality: N/A;   INTRAOPERATIVE ARTERIOGRAM Right 03/31/2016   Procedure: INTRA OPERATIVE ARTERIOGRAM;  Surgeon: Dannis Dy, MD;  Location: [facility] OR;  Service: Vascular;  Laterality: Right;   IR GENERIC HISTORICAL Right 11/30/2015   IR THROMBECTOMY AV FISTULA W/THROMBOLYSIS/PTA INC/SHUNT/IMG RIGHT 11/30/2015 Erica Hau, MD [facility]   IR GENERIC HISTORICAL  11/30/2015   IR US  GUIDE VASC ACCESS RIGHT 11/30/2015 Erica Hau, MD [facility]   IR GENERIC HISTORICAL Right 12/15/2015   IR THROMBECTOMY AV FISTULA W/THROMBOLYSIS/PTA/STENT INC/SHUNT/IMG RT 12/15/2015 Marland Silvas, MD [facility]  IR GENERIC HISTORICAL  12/15/2015   IR US  GUIDE VASC ACCESS RIGHT 12/15/2015 Marland Silvas, MD [facility]   IR GENERIC HISTORICAL  12/28/2015   IR FLUORO GUIDE CV LINE RIGHT 12/28/2015 Artice Last, MD [facility]   IR GENERIC HISTORICAL  12/28/2015   IR US  GUIDE VASC ACCESS RIGHT 12/28/2015 Artice Last, MD [facility]   LEFT A ND RIGHT HEART CATH  01/30/2013   DR Dasie Epps   LEFT AND RIGHT HEART CATHETERIZATION WITH CORONARY ANGIOGRAM N/A 01/30/2013   Procedure: LEFT AND RIGHT HEART CATHETERIZATION WITH CORONARY ANGIOGRAM;  Surgeon: Mardell Shade, MD;  Location: [facility] CATH LAB;  Service: Cardiovascular;  Laterality: N/A;   LEFT HEART CATH AND CORONARY ANGIOGRAPHY N/A 04/12/2023   Procedure: LEFT HEART CATH AND CORONARY ANGIOGRAPHY;  Surgeon: Swaziland, Peter M, MD;  Location: [facility] INVASIVE CV LAB;  Service:  Cardiovascular;  Laterality: N/A;   PERIPHERAL VASCULAR CATHETERIZATION Right 01/26/2015   Procedure: A/V Fistulagram;  Surgeon: Dannis Dy, MD;  Location: [facility] INVASIVE CV LAB;  Service: Cardiovascular;  Laterality: Right;   reapea urethral surgery for recurrent obstruction  2011   TOTAL KNEE ARTHROPLASTY Right 2007   VEIN HARVEST Right 03/31/2016   Procedure: RIGHT GREATER SAPHENOUS VEIN HARVEST;  Surgeon: Dannis Dy, MD;  Location: [facility] OR;  Service: Vascular;  Laterality: Right;    reports that he quit smoking about 13 years ago. His smoking use included cigarettes. He started smoking about 45 years ago. He has a 64 pack-year smoking history. He has never used smokeless tobacco. He reports that he does not drink alcohol  and does not use drugs. family history includes Alcohol  abuse in his father; Bladder Cancer in his mother; Diabetes in his maternal grandmother; Heart Problems in his maternal grandmother; Heart disease in his maternal grandfather; Melanoma in his father; Prostate cancer in his maternal grandfather; Stroke in his maternal grandmother. Allergies  Allergen Reactions   Epoetin  Alfa Other (See Comments)    Unknown    Ferumoxytol  Other (See Comments)    Unknown    Morphine Sulfate Rash and Other (See Comments)    Itches all over, red spots    Review of Systems  Constitutional:  Negative for fever.  Eyes:  Negative for blurred vision.  Respiratory:  Negative for shortness of breath.   Cardiovascular:  Negative for chest pain.  Neurological:  Negative for dizziness, weakness and headaches.      Objective:      There were no vitals taken for this visit. BP Readings from Last 3 Encounters:  06/09/23 122/72  05/24/23 (!) 124/94  05/22/23 (!) 144/52   Wt Readings from Last 3 Encounters:  06/09/23 191 lb (86.6 kg)  05/24/23 187 lb 11.2 oz (85.1 kg)  05/21/23 195 lb (88.5 kg)      Physical Exam Vitals reviewed.  Constitutional:      General: He  is not in acute distress.    Appearance: He is not ill-appearing.  Cardiovascular:     Rate and Rhythm: Normal rate and regular rhythm.  Pulmonary:     Effort: Pulmonary effort is normal.     Breath sounds: Normal breath sounds. No wheezing or rales.  Neurological:     Mental Status: He is alert.      Results for orders placed or performed in visit on 06/19/23  POC HgB A1c  Result Value Ref Range   Hemoglobin A1C 5.8 (A) 4.0 - 5.6 %   HbA1c POC (<> result, manual entry)  HbA1c, POC (prediabetic range)     HbA1c, POC (controlled diabetic range)      Last CBC Lab Results  Component Value Date   WBC 12.6 (H) 05/22/2023   HGB 11.5 (L) 05/22/2023   HCT 36.9 (L) 05/22/2023   MCV 93.2 05/22/2023   MCH 29.0 05/22/2023   RDW 14.7 05/22/2023   PLT 203 05/22/2023   Last metabolic panel Lab Results  Component Value Date   GLUCOSE 106 (H) 05/22/2023   NA 135 05/22/2023   K 3.3 (L) 05/22/2023   CL 98 05/22/2023   CO2 26 05/22/2023   BUN 10 05/22/2023   CREATININE 4.03 (H) 05/22/2023   CREATININE 3.97 (H) 05/22/2023   GFRNONAA 16 (L) 05/22/2023   GFRNONAA 16 (L) 05/22/2023   CALCIUM  8.2 (L) 05/22/2023   PHOS 5.0 (H) 05/02/2023   PROT 5.9 (L) 04/30/2023   ALBUMIN  2.1 (L) 05/02/2023   BILITOT 0.8 04/30/2023   ALKPHOS 71 04/30/2023   AST 47 (H) 04/30/2023   ALT 25 04/30/2023   ANIONGAP 11 05/22/2023   Last lipids Lab Results  Component Value Date   CHOL 78 04/11/2023   HDL 30 (L) 04/11/2023   LDLCALC 7 04/11/2023   LDLDIRECT 134.0 04/25/2011   TRIG 203 (H) 04/11/2023   CHOLHDL 2.6 04/11/2023   Last hemoglobin A1c Lab Results  Component Value Date   HGBA1C 5.8 (A) 06/19/2023   Last thyroid  functions Lab Results  Component Value Date   TSH 3.090 05/02/2023      The ASCVD Risk score (Arnett DK, et al., 2019) failed to calculate for the following reasons:   Risk score cannot be calculated because patient has a medical history suggesting prior/existing  ASCVD    Assessment & Plan:   #1 type 2 diabetes on chronic insulin .  He has had several years of poor control but finally controlled well with A1c today 5.8%.  Much improved compliance with diet.  We have cautioned him about risk for hypoglycemia especially with his chronic kidney disease.  He is on sliding scale Humalog  but may have to back down on his Lantus  dosage if sugars continue to drop.  Reassess in 3 months  #2 hyperlipidemia.  History of CAD.  Recent LDL cholesterol 7 on high-dose statin  #3 high risk medicine use with amiodarone .  Check TSH and CMP today  Return in about 3 months (around 09/19/2023).    Glean Lamy, MD

## 2023-06-19 NOTE — Telephone Encounter (Signed)
 Sent a critical lab to dr brochette.

## 2023-06-19 NOTE — Telephone Encounter (Signed)
 Please see critical value from previous encounter message sent to PCP

## 2023-06-20 DIAGNOSIS — D509 Iron deficiency anemia, unspecified: Secondary | ICD-10-CM | POA: Diagnosis not present

## 2023-06-20 DIAGNOSIS — N186 End stage renal disease: Secondary | ICD-10-CM | POA: Diagnosis not present

## 2023-06-20 DIAGNOSIS — Z992 Dependence on renal dialysis: Secondary | ICD-10-CM | POA: Diagnosis not present

## 2023-06-20 DIAGNOSIS — N2581 Secondary hyperparathyroidism of renal origin: Secondary | ICD-10-CM | POA: Diagnosis not present

## 2023-06-20 DIAGNOSIS — D631 Anemia in chronic kidney disease: Secondary | ICD-10-CM | POA: Diagnosis not present

## 2023-06-22 DIAGNOSIS — Z992 Dependence on renal dialysis: Secondary | ICD-10-CM | POA: Diagnosis not present

## 2023-06-22 DIAGNOSIS — N2581 Secondary hyperparathyroidism of renal origin: Secondary | ICD-10-CM | POA: Diagnosis not present

## 2023-06-22 DIAGNOSIS — D509 Iron deficiency anemia, unspecified: Secondary | ICD-10-CM | POA: Diagnosis not present

## 2023-06-22 DIAGNOSIS — D631 Anemia in chronic kidney disease: Secondary | ICD-10-CM | POA: Diagnosis not present

## 2023-06-22 DIAGNOSIS — N186 End stage renal disease: Secondary | ICD-10-CM | POA: Diagnosis not present

## 2023-06-22 LAB — TSH: TSH: 1.71 u[IU]/mL (ref 0.35–5.50)

## 2023-06-23 ENCOUNTER — Ambulatory Visit: Payer: Self-pay | Admitting: Family Medicine

## 2023-06-24 DIAGNOSIS — Z992 Dependence on renal dialysis: Secondary | ICD-10-CM | POA: Diagnosis not present

## 2023-06-24 DIAGNOSIS — D509 Iron deficiency anemia, unspecified: Secondary | ICD-10-CM | POA: Diagnosis not present

## 2023-06-24 DIAGNOSIS — N186 End stage renal disease: Secondary | ICD-10-CM | POA: Diagnosis not present

## 2023-06-24 DIAGNOSIS — N2581 Secondary hyperparathyroidism of renal origin: Secondary | ICD-10-CM | POA: Diagnosis not present

## 2023-06-24 DIAGNOSIS — D631 Anemia in chronic kidney disease: Secondary | ICD-10-CM | POA: Diagnosis not present

## 2023-06-26 DIAGNOSIS — D631 Anemia in chronic kidney disease: Secondary | ICD-10-CM | POA: Diagnosis not present

## 2023-06-26 DIAGNOSIS — D509 Iron deficiency anemia, unspecified: Secondary | ICD-10-CM | POA: Diagnosis not present

## 2023-06-26 DIAGNOSIS — Z992 Dependence on renal dialysis: Secondary | ICD-10-CM | POA: Diagnosis not present

## 2023-06-26 DIAGNOSIS — N186 End stage renal disease: Secondary | ICD-10-CM | POA: Diagnosis not present

## 2023-06-26 DIAGNOSIS — N2581 Secondary hyperparathyroidism of renal origin: Secondary | ICD-10-CM | POA: Diagnosis not present

## 2023-06-27 ENCOUNTER — Ambulatory Visit: Payer: Medicare Other | Admitting: Cardiology

## 2023-06-27 DIAGNOSIS — N2581 Secondary hyperparathyroidism of renal origin: Secondary | ICD-10-CM | POA: Diagnosis not present

## 2023-06-27 DIAGNOSIS — N186 End stage renal disease: Secondary | ICD-10-CM | POA: Diagnosis not present

## 2023-06-27 DIAGNOSIS — D509 Iron deficiency anemia, unspecified: Secondary | ICD-10-CM | POA: Diagnosis not present

## 2023-06-27 DIAGNOSIS — D631 Anemia in chronic kidney disease: Secondary | ICD-10-CM | POA: Diagnosis not present

## 2023-06-27 DIAGNOSIS — Z992 Dependence on renal dialysis: Secondary | ICD-10-CM | POA: Diagnosis not present

## 2023-06-29 DIAGNOSIS — Z992 Dependence on renal dialysis: Secondary | ICD-10-CM | POA: Diagnosis not present

## 2023-06-29 DIAGNOSIS — D631 Anemia in chronic kidney disease: Secondary | ICD-10-CM | POA: Diagnosis not present

## 2023-06-29 DIAGNOSIS — N2581 Secondary hyperparathyroidism of renal origin: Secondary | ICD-10-CM | POA: Diagnosis not present

## 2023-06-29 DIAGNOSIS — D509 Iron deficiency anemia, unspecified: Secondary | ICD-10-CM | POA: Diagnosis not present

## 2023-06-29 DIAGNOSIS — N186 End stage renal disease: Secondary | ICD-10-CM | POA: Diagnosis not present

## 2023-06-30 ENCOUNTER — Ambulatory Visit: Admitting: Cardiology

## 2023-07-01 DIAGNOSIS — D631 Anemia in chronic kidney disease: Secondary | ICD-10-CM | POA: Diagnosis not present

## 2023-07-01 DIAGNOSIS — N2581 Secondary hyperparathyroidism of renal origin: Secondary | ICD-10-CM | POA: Diagnosis not present

## 2023-07-01 DIAGNOSIS — N186 End stage renal disease: Secondary | ICD-10-CM | POA: Diagnosis not present

## 2023-07-01 DIAGNOSIS — D509 Iron deficiency anemia, unspecified: Secondary | ICD-10-CM | POA: Diagnosis not present

## 2023-07-01 DIAGNOSIS — Z992 Dependence on renal dialysis: Secondary | ICD-10-CM | POA: Diagnosis not present

## 2023-07-04 DIAGNOSIS — N2581 Secondary hyperparathyroidism of renal origin: Secondary | ICD-10-CM | POA: Diagnosis not present

## 2023-07-04 DIAGNOSIS — D509 Iron deficiency anemia, unspecified: Secondary | ICD-10-CM | POA: Diagnosis not present

## 2023-07-04 DIAGNOSIS — E119 Type 2 diabetes mellitus without complications: Secondary | ICD-10-CM | POA: Diagnosis not present

## 2023-07-04 DIAGNOSIS — Z992 Dependence on renal dialysis: Secondary | ICD-10-CM | POA: Diagnosis not present

## 2023-07-04 DIAGNOSIS — N186 End stage renal disease: Secondary | ICD-10-CM | POA: Diagnosis not present

## 2023-07-04 DIAGNOSIS — D631 Anemia in chronic kidney disease: Secondary | ICD-10-CM | POA: Diagnosis not present

## 2023-07-04 DIAGNOSIS — Z794 Long term (current) use of insulin: Secondary | ICD-10-CM | POA: Diagnosis not present

## 2023-07-06 DIAGNOSIS — N186 End stage renal disease: Secondary | ICD-10-CM | POA: Diagnosis not present

## 2023-07-06 DIAGNOSIS — D509 Iron deficiency anemia, unspecified: Secondary | ICD-10-CM | POA: Diagnosis not present

## 2023-07-06 DIAGNOSIS — N2581 Secondary hyperparathyroidism of renal origin: Secondary | ICD-10-CM | POA: Diagnosis not present

## 2023-07-06 DIAGNOSIS — D631 Anemia in chronic kidney disease: Secondary | ICD-10-CM | POA: Diagnosis not present

## 2023-07-06 DIAGNOSIS — Z992 Dependence on renal dialysis: Secondary | ICD-10-CM | POA: Diagnosis not present

## 2023-07-08 DIAGNOSIS — D631 Anemia in chronic kidney disease: Secondary | ICD-10-CM | POA: Diagnosis not present

## 2023-07-08 DIAGNOSIS — D509 Iron deficiency anemia, unspecified: Secondary | ICD-10-CM | POA: Diagnosis not present

## 2023-07-08 DIAGNOSIS — N2581 Secondary hyperparathyroidism of renal origin: Secondary | ICD-10-CM | POA: Diagnosis not present

## 2023-07-08 DIAGNOSIS — Z992 Dependence on renal dialysis: Secondary | ICD-10-CM | POA: Diagnosis not present

## 2023-07-08 DIAGNOSIS — N186 End stage renal disease: Secondary | ICD-10-CM | POA: Diagnosis not present

## 2023-07-10 ENCOUNTER — Other Ambulatory Visit: Payer: Self-pay | Admitting: Family Medicine

## 2023-07-10 DIAGNOSIS — N186 End stage renal disease: Secondary | ICD-10-CM | POA: Diagnosis not present

## 2023-07-10 DIAGNOSIS — Z992 Dependence on renal dialysis: Secondary | ICD-10-CM | POA: Diagnosis not present

## 2023-07-11 DIAGNOSIS — N186 End stage renal disease: Secondary | ICD-10-CM | POA: Diagnosis not present

## 2023-07-11 DIAGNOSIS — Z992 Dependence on renal dialysis: Secondary | ICD-10-CM | POA: Diagnosis not present

## 2023-07-11 DIAGNOSIS — D509 Iron deficiency anemia, unspecified: Secondary | ICD-10-CM | POA: Diagnosis not present

## 2023-07-11 DIAGNOSIS — N2581 Secondary hyperparathyroidism of renal origin: Secondary | ICD-10-CM | POA: Diagnosis not present

## 2023-07-11 DIAGNOSIS — N250 Renal osteodystrophy: Secondary | ICD-10-CM | POA: Diagnosis not present

## 2023-07-13 DIAGNOSIS — N2581 Secondary hyperparathyroidism of renal origin: Secondary | ICD-10-CM | POA: Diagnosis not present

## 2023-07-13 DIAGNOSIS — N186 End stage renal disease: Secondary | ICD-10-CM | POA: Diagnosis not present

## 2023-07-13 DIAGNOSIS — Z992 Dependence on renal dialysis: Secondary | ICD-10-CM | POA: Diagnosis not present

## 2023-07-13 DIAGNOSIS — D509 Iron deficiency anemia, unspecified: Secondary | ICD-10-CM | POA: Diagnosis not present

## 2023-07-13 DIAGNOSIS — N250 Renal osteodystrophy: Secondary | ICD-10-CM | POA: Diagnosis not present

## 2023-07-15 DIAGNOSIS — N2581 Secondary hyperparathyroidism of renal origin: Secondary | ICD-10-CM | POA: Diagnosis not present

## 2023-07-15 DIAGNOSIS — Z992 Dependence on renal dialysis: Secondary | ICD-10-CM | POA: Diagnosis not present

## 2023-07-15 DIAGNOSIS — N250 Renal osteodystrophy: Secondary | ICD-10-CM | POA: Diagnosis not present

## 2023-07-15 DIAGNOSIS — N186 End stage renal disease: Secondary | ICD-10-CM | POA: Diagnosis not present

## 2023-07-15 DIAGNOSIS — D509 Iron deficiency anemia, unspecified: Secondary | ICD-10-CM | POA: Diagnosis not present

## 2023-07-18 DIAGNOSIS — N186 End stage renal disease: Secondary | ICD-10-CM | POA: Diagnosis not present

## 2023-07-18 DIAGNOSIS — Z992 Dependence on renal dialysis: Secondary | ICD-10-CM | POA: Diagnosis not present

## 2023-07-18 DIAGNOSIS — N250 Renal osteodystrophy: Secondary | ICD-10-CM | POA: Diagnosis not present

## 2023-07-18 DIAGNOSIS — D509 Iron deficiency anemia, unspecified: Secondary | ICD-10-CM | POA: Diagnosis not present

## 2023-07-18 DIAGNOSIS — N2581 Secondary hyperparathyroidism of renal origin: Secondary | ICD-10-CM | POA: Diagnosis not present

## 2023-07-20 DIAGNOSIS — N2581 Secondary hyperparathyroidism of renal origin: Secondary | ICD-10-CM | POA: Diagnosis not present

## 2023-07-20 DIAGNOSIS — Z992 Dependence on renal dialysis: Secondary | ICD-10-CM | POA: Diagnosis not present

## 2023-07-20 DIAGNOSIS — D509 Iron deficiency anemia, unspecified: Secondary | ICD-10-CM | POA: Diagnosis not present

## 2023-07-20 DIAGNOSIS — N250 Renal osteodystrophy: Secondary | ICD-10-CM | POA: Diagnosis not present

## 2023-07-20 DIAGNOSIS — N186 End stage renal disease: Secondary | ICD-10-CM | POA: Diagnosis not present

## 2023-07-22 DIAGNOSIS — N2581 Secondary hyperparathyroidism of renal origin: Secondary | ICD-10-CM | POA: Diagnosis not present

## 2023-07-22 DIAGNOSIS — N186 End stage renal disease: Secondary | ICD-10-CM | POA: Diagnosis not present

## 2023-07-22 DIAGNOSIS — Z992 Dependence on renal dialysis: Secondary | ICD-10-CM | POA: Diagnosis not present

## 2023-07-22 DIAGNOSIS — D509 Iron deficiency anemia, unspecified: Secondary | ICD-10-CM | POA: Diagnosis not present

## 2023-07-22 DIAGNOSIS — N250 Renal osteodystrophy: Secondary | ICD-10-CM | POA: Diagnosis not present

## 2023-07-25 DIAGNOSIS — D509 Iron deficiency anemia, unspecified: Secondary | ICD-10-CM | POA: Diagnosis not present

## 2023-07-25 DIAGNOSIS — Z992 Dependence on renal dialysis: Secondary | ICD-10-CM | POA: Diagnosis not present

## 2023-07-25 DIAGNOSIS — N186 End stage renal disease: Secondary | ICD-10-CM | POA: Diagnosis not present

## 2023-07-25 DIAGNOSIS — N2581 Secondary hyperparathyroidism of renal origin: Secondary | ICD-10-CM | POA: Diagnosis not present

## 2023-07-25 DIAGNOSIS — N250 Renal osteodystrophy: Secondary | ICD-10-CM | POA: Diagnosis not present

## 2023-07-26 ENCOUNTER — Ambulatory Visit: Admitting: Pulmonary Disease

## 2023-07-27 DIAGNOSIS — D509 Iron deficiency anemia, unspecified: Secondary | ICD-10-CM | POA: Diagnosis not present

## 2023-07-27 DIAGNOSIS — N186 End stage renal disease: Secondary | ICD-10-CM | POA: Diagnosis not present

## 2023-07-27 DIAGNOSIS — N2581 Secondary hyperparathyroidism of renal origin: Secondary | ICD-10-CM | POA: Diagnosis not present

## 2023-07-27 DIAGNOSIS — N250 Renal osteodystrophy: Secondary | ICD-10-CM | POA: Diagnosis not present

## 2023-07-27 DIAGNOSIS — Z992 Dependence on renal dialysis: Secondary | ICD-10-CM | POA: Diagnosis not present

## 2023-07-29 DIAGNOSIS — D509 Iron deficiency anemia, unspecified: Secondary | ICD-10-CM | POA: Diagnosis not present

## 2023-07-29 DIAGNOSIS — Z992 Dependence on renal dialysis: Secondary | ICD-10-CM | POA: Diagnosis not present

## 2023-07-29 DIAGNOSIS — N186 End stage renal disease: Secondary | ICD-10-CM | POA: Diagnosis not present

## 2023-07-29 DIAGNOSIS — N2581 Secondary hyperparathyroidism of renal origin: Secondary | ICD-10-CM | POA: Diagnosis not present

## 2023-07-29 DIAGNOSIS — N250 Renal osteodystrophy: Secondary | ICD-10-CM | POA: Diagnosis not present

## 2023-08-01 DIAGNOSIS — N250 Renal osteodystrophy: Secondary | ICD-10-CM | POA: Diagnosis not present

## 2023-08-01 DIAGNOSIS — D509 Iron deficiency anemia, unspecified: Secondary | ICD-10-CM | POA: Diagnosis not present

## 2023-08-01 DIAGNOSIS — Z992 Dependence on renal dialysis: Secondary | ICD-10-CM | POA: Diagnosis not present

## 2023-08-01 DIAGNOSIS — N2581 Secondary hyperparathyroidism of renal origin: Secondary | ICD-10-CM | POA: Diagnosis not present

## 2023-08-01 DIAGNOSIS — N186 End stage renal disease: Secondary | ICD-10-CM | POA: Diagnosis not present

## 2023-08-03 DIAGNOSIS — N186 End stage renal disease: Secondary | ICD-10-CM | POA: Diagnosis not present

## 2023-08-03 DIAGNOSIS — N2581 Secondary hyperparathyroidism of renal origin: Secondary | ICD-10-CM | POA: Diagnosis not present

## 2023-08-03 DIAGNOSIS — N250 Renal osteodystrophy: Secondary | ICD-10-CM | POA: Diagnosis not present

## 2023-08-03 DIAGNOSIS — Z992 Dependence on renal dialysis: Secondary | ICD-10-CM | POA: Diagnosis not present

## 2023-08-03 DIAGNOSIS — D509 Iron deficiency anemia, unspecified: Secondary | ICD-10-CM | POA: Diagnosis not present

## 2023-08-05 DIAGNOSIS — N186 End stage renal disease: Secondary | ICD-10-CM | POA: Diagnosis not present

## 2023-08-05 DIAGNOSIS — N2581 Secondary hyperparathyroidism of renal origin: Secondary | ICD-10-CM | POA: Diagnosis not present

## 2023-08-05 DIAGNOSIS — N250 Renal osteodystrophy: Secondary | ICD-10-CM | POA: Diagnosis not present

## 2023-08-05 DIAGNOSIS — D509 Iron deficiency anemia, unspecified: Secondary | ICD-10-CM | POA: Diagnosis not present

## 2023-08-05 DIAGNOSIS — Z992 Dependence on renal dialysis: Secondary | ICD-10-CM | POA: Diagnosis not present

## 2023-08-08 DIAGNOSIS — D509 Iron deficiency anemia, unspecified: Secondary | ICD-10-CM | POA: Diagnosis not present

## 2023-08-08 DIAGNOSIS — N250 Renal osteodystrophy: Secondary | ICD-10-CM | POA: Diagnosis not present

## 2023-08-08 DIAGNOSIS — N186 End stage renal disease: Secondary | ICD-10-CM | POA: Diagnosis not present

## 2023-08-08 DIAGNOSIS — N2581 Secondary hyperparathyroidism of renal origin: Secondary | ICD-10-CM | POA: Diagnosis not present

## 2023-08-08 DIAGNOSIS — Z992 Dependence on renal dialysis: Secondary | ICD-10-CM | POA: Diagnosis not present

## 2023-08-10 DIAGNOSIS — D509 Iron deficiency anemia, unspecified: Secondary | ICD-10-CM | POA: Diagnosis not present

## 2023-08-10 DIAGNOSIS — Z992 Dependence on renal dialysis: Secondary | ICD-10-CM | POA: Diagnosis not present

## 2023-08-10 DIAGNOSIS — N186 End stage renal disease: Secondary | ICD-10-CM | POA: Diagnosis not present

## 2023-08-10 DIAGNOSIS — N250 Renal osteodystrophy: Secondary | ICD-10-CM | POA: Diagnosis not present

## 2023-08-10 DIAGNOSIS — N2581 Secondary hyperparathyroidism of renal origin: Secondary | ICD-10-CM | POA: Diagnosis not present

## 2023-08-11 DIAGNOSIS — N250 Renal osteodystrophy: Secondary | ICD-10-CM | POA: Diagnosis not present

## 2023-08-11 DIAGNOSIS — Z992 Dependence on renal dialysis: Secondary | ICD-10-CM | POA: Diagnosis not present

## 2023-08-11 DIAGNOSIS — N2581 Secondary hyperparathyroidism of renal origin: Secondary | ICD-10-CM | POA: Diagnosis not present

## 2023-08-11 DIAGNOSIS — N186 End stage renal disease: Secondary | ICD-10-CM | POA: Diagnosis not present

## 2023-08-11 DIAGNOSIS — D509 Iron deficiency anemia, unspecified: Secondary | ICD-10-CM | POA: Diagnosis not present

## 2023-08-12 DIAGNOSIS — N250 Renal osteodystrophy: Secondary | ICD-10-CM | POA: Diagnosis not present

## 2023-08-12 DIAGNOSIS — N186 End stage renal disease: Secondary | ICD-10-CM | POA: Diagnosis not present

## 2023-08-12 DIAGNOSIS — N2581 Secondary hyperparathyroidism of renal origin: Secondary | ICD-10-CM | POA: Diagnosis not present

## 2023-08-12 DIAGNOSIS — D509 Iron deficiency anemia, unspecified: Secondary | ICD-10-CM | POA: Diagnosis not present

## 2023-08-12 DIAGNOSIS — Z992 Dependence on renal dialysis: Secondary | ICD-10-CM | POA: Diagnosis not present

## 2023-08-14 ENCOUNTER — Other Ambulatory Visit: Payer: Self-pay | Admitting: Family Medicine

## 2023-08-14 MED ORDER — CLOPIDOGREL BISULFATE 75 MG PO TABS: 75.0000 mg | ORAL_TABLET | Freq: Every day | ORAL | 2 refills | Status: DC

## 2023-08-14 NOTE — Telephone Encounter (Signed)
 Copied from CRM [id number]. Topic: Clinical - Medication Refill >> Aug 14, 2023  9:33 AM Tiffini S wrote: Medication: clopidogrel  (PLAVIX ) 75 MG tablet  Has the patient contacted their pharmacy? Yes, refaxed request for medication refill  (Agent: If no, request that the patient contact the pharmacy for the refill. If patient does not wish to contact the pharmacy document the reason why and proceed with request.) (Agent: If yes, when and what did the pharmacy advise?)  This is the patient's preferred pharmacy:  [facility] - [place], [place] - [address] [place] [address] Phone: [phone] Fax: [phone]   Is this the correct pharmacy for this prescription? Yes If no, delete pharmacy and type the correct one.   Has the prescription been filled recently? Yes  Is the patient out of the medication? No, had enough for this week   Has the patient been seen for an appointment in the last year OR does the patient have an upcoming appointment? Yes  Can we respond through MyChart? No, please call patient at [phone]  Agent: Please be advised that Rx refills may take up to 3 business days. We ask that you follow-up with your pharmacy.

## 2023-08-15 DIAGNOSIS — Z992 Dependence on renal dialysis: Secondary | ICD-10-CM | POA: Diagnosis not present

## 2023-08-15 DIAGNOSIS — N186 End stage renal disease: Secondary | ICD-10-CM | POA: Diagnosis not present

## 2023-08-15 DIAGNOSIS — D509 Iron deficiency anemia, unspecified: Secondary | ICD-10-CM | POA: Diagnosis not present

## 2023-08-15 DIAGNOSIS — N250 Renal osteodystrophy: Secondary | ICD-10-CM | POA: Diagnosis not present

## 2023-08-15 DIAGNOSIS — N2581 Secondary hyperparathyroidism of renal origin: Secondary | ICD-10-CM | POA: Diagnosis not present

## 2023-08-16 ENCOUNTER — Other Ambulatory Visit: Payer: Self-pay | Admitting: Family Medicine

## 2023-08-16 MED ORDER — HUMALOG KWIKPEN 200 UNIT/ML ~~LOC~~ SOPN
PEN_INJECTOR | SUBCUTANEOUS | 5 refills | Status: DC
Start: 2023-08-16 — End: 2023-10-20

## 2023-08-16 NOTE — Telephone Encounter (Signed)
 Copied from CRM [id number]. Topic: Clinical - Medication Refill >> Aug 16, 2023  1:05 PM Berneda FALCON wrote: Medication:  HUMALOG  KWIKPEN 200 UNIT/ML KwikPen  Has the patient contacted their pharmacy? Yes (Agent: If no, request that the patient contact the pharmacy for the refill. If patient does not wish to contact the pharmacy document the reason why and proceed with request.) (Agent: If yes, when and what did the pharmacy advise?)  This is the patient's preferred pharmacy:  [facility] - [place], [place] - [address] [place] [address] Phone: [phone] Fax: [phone]  Is this the correct pharmacy for this prescription? Yes If no, delete pharmacy and type the correct one.   Has the prescription been filled recently? No  Is the patient out of the medication? No, but has the last pen  Has the patient been seen for an appointment in the last year OR does the patient have an upcoming appointment? Yes  Can we respond through MyChart? No, please text  Agent: Please be advised that Rx refills may take up to 3 business days. We ask that you follow-up with your pharmacy.

## 2023-08-17 DIAGNOSIS — N250 Renal osteodystrophy: Secondary | ICD-10-CM | POA: Diagnosis not present

## 2023-08-17 DIAGNOSIS — D509 Iron deficiency anemia, unspecified: Secondary | ICD-10-CM | POA: Diagnosis not present

## 2023-08-17 DIAGNOSIS — N2581 Secondary hyperparathyroidism of renal origin: Secondary | ICD-10-CM | POA: Diagnosis not present

## 2023-08-17 DIAGNOSIS — Z992 Dependence on renal dialysis: Secondary | ICD-10-CM | POA: Diagnosis not present

## 2023-08-17 DIAGNOSIS — N186 End stage renal disease: Secondary | ICD-10-CM | POA: Diagnosis not present

## 2023-08-18 ENCOUNTER — Other Ambulatory Visit: Payer: Self-pay | Admitting: Family Medicine

## 2023-08-19 DIAGNOSIS — N186 End stage renal disease: Secondary | ICD-10-CM | POA: Diagnosis not present

## 2023-08-19 DIAGNOSIS — D509 Iron deficiency anemia, unspecified: Secondary | ICD-10-CM | POA: Diagnosis not present

## 2023-08-19 DIAGNOSIS — N2581 Secondary hyperparathyroidism of renal origin: Secondary | ICD-10-CM | POA: Diagnosis not present

## 2023-08-19 DIAGNOSIS — Z992 Dependence on renal dialysis: Secondary | ICD-10-CM | POA: Diagnosis not present

## 2023-08-19 DIAGNOSIS — N250 Renal osteodystrophy: Secondary | ICD-10-CM | POA: Diagnosis not present

## 2023-08-22 DIAGNOSIS — Z992 Dependence on renal dialysis: Secondary | ICD-10-CM | POA: Diagnosis not present

## 2023-08-22 DIAGNOSIS — N2581 Secondary hyperparathyroidism of renal origin: Secondary | ICD-10-CM | POA: Diagnosis not present

## 2023-08-22 DIAGNOSIS — N186 End stage renal disease: Secondary | ICD-10-CM | POA: Diagnosis not present

## 2023-08-22 DIAGNOSIS — D509 Iron deficiency anemia, unspecified: Secondary | ICD-10-CM | POA: Diagnosis not present

## 2023-08-22 DIAGNOSIS — N250 Renal osteodystrophy: Secondary | ICD-10-CM | POA: Diagnosis not present

## 2023-08-24 DIAGNOSIS — N2581 Secondary hyperparathyroidism of renal origin: Secondary | ICD-10-CM | POA: Diagnosis not present

## 2023-08-24 DIAGNOSIS — Z992 Dependence on renal dialysis: Secondary | ICD-10-CM | POA: Diagnosis not present

## 2023-08-24 DIAGNOSIS — N250 Renal osteodystrophy: Secondary | ICD-10-CM | POA: Diagnosis not present

## 2023-08-24 DIAGNOSIS — N186 End stage renal disease: Secondary | ICD-10-CM | POA: Diagnosis not present

## 2023-08-24 DIAGNOSIS — D509 Iron deficiency anemia, unspecified: Secondary | ICD-10-CM | POA: Diagnosis not present

## 2023-08-26 DIAGNOSIS — D509 Iron deficiency anemia, unspecified: Secondary | ICD-10-CM | POA: Diagnosis not present

## 2023-08-26 DIAGNOSIS — Z992 Dependence on renal dialysis: Secondary | ICD-10-CM | POA: Diagnosis not present

## 2023-08-26 DIAGNOSIS — N250 Renal osteodystrophy: Secondary | ICD-10-CM | POA: Diagnosis not present

## 2023-08-26 DIAGNOSIS — N2581 Secondary hyperparathyroidism of renal origin: Secondary | ICD-10-CM | POA: Diagnosis not present

## 2023-08-26 DIAGNOSIS — N186 End stage renal disease: Secondary | ICD-10-CM | POA: Diagnosis not present

## 2023-08-28 ENCOUNTER — Other Ambulatory Visit: Payer: Self-pay | Admitting: Family Medicine

## 2023-08-29 DIAGNOSIS — N2581 Secondary hyperparathyroidism of renal origin: Secondary | ICD-10-CM | POA: Diagnosis not present

## 2023-08-29 DIAGNOSIS — D509 Iron deficiency anemia, unspecified: Secondary | ICD-10-CM | POA: Diagnosis not present

## 2023-08-29 DIAGNOSIS — N186 End stage renal disease: Secondary | ICD-10-CM | POA: Diagnosis not present

## 2023-08-29 DIAGNOSIS — Z992 Dependence on renal dialysis: Secondary | ICD-10-CM | POA: Diagnosis not present

## 2023-08-29 DIAGNOSIS — N250 Renal osteodystrophy: Secondary | ICD-10-CM | POA: Diagnosis not present

## 2023-08-30 DIAGNOSIS — N2581 Secondary hyperparathyroidism of renal origin: Secondary | ICD-10-CM | POA: Diagnosis not present

## 2023-08-30 DIAGNOSIS — N250 Renal osteodystrophy: Secondary | ICD-10-CM | POA: Diagnosis not present

## 2023-08-30 DIAGNOSIS — Z992 Dependence on renal dialysis: Secondary | ICD-10-CM | POA: Diagnosis not present

## 2023-08-30 DIAGNOSIS — N186 End stage renal disease: Secondary | ICD-10-CM | POA: Diagnosis not present

## 2023-08-30 DIAGNOSIS — D509 Iron deficiency anemia, unspecified: Secondary | ICD-10-CM | POA: Diagnosis not present

## 2023-08-31 DIAGNOSIS — D509 Iron deficiency anemia, unspecified: Secondary | ICD-10-CM | POA: Diagnosis not present

## 2023-08-31 DIAGNOSIS — N2581 Secondary hyperparathyroidism of renal origin: Secondary | ICD-10-CM | POA: Diagnosis not present

## 2023-08-31 DIAGNOSIS — N250 Renal osteodystrophy: Secondary | ICD-10-CM | POA: Diagnosis not present

## 2023-08-31 DIAGNOSIS — Z992 Dependence on renal dialysis: Secondary | ICD-10-CM | POA: Diagnosis not present

## 2023-08-31 DIAGNOSIS — N186 End stage renal disease: Secondary | ICD-10-CM | POA: Diagnosis not present

## 2023-09-02 DIAGNOSIS — Z992 Dependence on renal dialysis: Secondary | ICD-10-CM | POA: Diagnosis not present

## 2023-09-02 DIAGNOSIS — N186 End stage renal disease: Secondary | ICD-10-CM | POA: Diagnosis not present

## 2023-09-02 DIAGNOSIS — N2581 Secondary hyperparathyroidism of renal origin: Secondary | ICD-10-CM | POA: Diagnosis not present

## 2023-09-02 DIAGNOSIS — D509 Iron deficiency anemia, unspecified: Secondary | ICD-10-CM | POA: Diagnosis not present

## 2023-09-02 DIAGNOSIS — N250 Renal osteodystrophy: Secondary | ICD-10-CM | POA: Diagnosis not present

## 2023-09-05 DIAGNOSIS — D509 Iron deficiency anemia, unspecified: Secondary | ICD-10-CM | POA: Diagnosis not present

## 2023-09-05 DIAGNOSIS — Z992 Dependence on renal dialysis: Secondary | ICD-10-CM | POA: Diagnosis not present

## 2023-09-05 DIAGNOSIS — N186 End stage renal disease: Secondary | ICD-10-CM | POA: Diagnosis not present

## 2023-09-05 DIAGNOSIS — N250 Renal osteodystrophy: Secondary | ICD-10-CM | POA: Diagnosis not present

## 2023-09-05 DIAGNOSIS — N2581 Secondary hyperparathyroidism of renal origin: Secondary | ICD-10-CM | POA: Diagnosis not present

## 2023-09-06 ENCOUNTER — Ambulatory Visit: Payer: Medicare Other | Admitting: Neurology

## 2023-09-07 DIAGNOSIS — N250 Renal osteodystrophy: Secondary | ICD-10-CM | POA: Diagnosis not present

## 2023-09-07 DIAGNOSIS — N2581 Secondary hyperparathyroidism of renal origin: Secondary | ICD-10-CM | POA: Diagnosis not present

## 2023-09-07 DIAGNOSIS — Z992 Dependence on renal dialysis: Secondary | ICD-10-CM | POA: Diagnosis not present

## 2023-09-07 DIAGNOSIS — D509 Iron deficiency anemia, unspecified: Secondary | ICD-10-CM | POA: Diagnosis not present

## 2023-09-07 DIAGNOSIS — N186 End stage renal disease: Secondary | ICD-10-CM | POA: Diagnosis not present

## 2023-09-09 DIAGNOSIS — Z992 Dependence on renal dialysis: Secondary | ICD-10-CM | POA: Diagnosis not present

## 2023-09-09 DIAGNOSIS — N2581 Secondary hyperparathyroidism of renal origin: Secondary | ICD-10-CM | POA: Diagnosis not present

## 2023-09-09 DIAGNOSIS — N186 End stage renal disease: Secondary | ICD-10-CM | POA: Diagnosis not present

## 2023-09-09 DIAGNOSIS — N250 Renal osteodystrophy: Secondary | ICD-10-CM | POA: Diagnosis not present

## 2023-09-09 DIAGNOSIS — D509 Iron deficiency anemia, unspecified: Secondary | ICD-10-CM | POA: Diagnosis not present

## 2023-09-10 DIAGNOSIS — N186 End stage renal disease: Secondary | ICD-10-CM | POA: Diagnosis not present

## 2023-09-10 DIAGNOSIS — Z992 Dependence on renal dialysis: Secondary | ICD-10-CM | POA: Diagnosis not present

## 2023-09-11 DIAGNOSIS — N186 End stage renal disease: Secondary | ICD-10-CM | POA: Diagnosis not present

## 2023-09-11 DIAGNOSIS — D509 Iron deficiency anemia, unspecified: Secondary | ICD-10-CM | POA: Diagnosis not present

## 2023-09-11 DIAGNOSIS — N2581 Secondary hyperparathyroidism of renal origin: Secondary | ICD-10-CM | POA: Diagnosis not present

## 2023-09-11 DIAGNOSIS — N250 Renal osteodystrophy: Secondary | ICD-10-CM | POA: Diagnosis not present

## 2023-09-11 DIAGNOSIS — Z992 Dependence on renal dialysis: Secondary | ICD-10-CM | POA: Diagnosis not present

## 2023-09-12 DIAGNOSIS — N2581 Secondary hyperparathyroidism of renal origin: Secondary | ICD-10-CM | POA: Diagnosis not present

## 2023-09-12 DIAGNOSIS — Z992 Dependence on renal dialysis: Secondary | ICD-10-CM | POA: Diagnosis not present

## 2023-09-12 DIAGNOSIS — N250 Renal osteodystrophy: Secondary | ICD-10-CM | POA: Diagnosis not present

## 2023-09-12 DIAGNOSIS — D509 Iron deficiency anemia, unspecified: Secondary | ICD-10-CM | POA: Diagnosis not present

## 2023-09-12 DIAGNOSIS — N186 End stage renal disease: Secondary | ICD-10-CM | POA: Diagnosis not present

## 2023-09-14 DIAGNOSIS — N186 End stage renal disease: Secondary | ICD-10-CM | POA: Diagnosis not present

## 2023-09-14 DIAGNOSIS — N250 Renal osteodystrophy: Secondary | ICD-10-CM | POA: Diagnosis not present

## 2023-09-14 DIAGNOSIS — D509 Iron deficiency anemia, unspecified: Secondary | ICD-10-CM | POA: Diagnosis not present

## 2023-09-14 DIAGNOSIS — Z992 Dependence on renal dialysis: Secondary | ICD-10-CM | POA: Diagnosis not present

## 2023-09-14 DIAGNOSIS — N2581 Secondary hyperparathyroidism of renal origin: Secondary | ICD-10-CM | POA: Diagnosis not present

## 2023-09-16 DIAGNOSIS — Z992 Dependence on renal dialysis: Secondary | ICD-10-CM | POA: Diagnosis not present

## 2023-09-16 DIAGNOSIS — N250 Renal osteodystrophy: Secondary | ICD-10-CM | POA: Diagnosis not present

## 2023-09-16 DIAGNOSIS — D509 Iron deficiency anemia, unspecified: Secondary | ICD-10-CM | POA: Diagnosis not present

## 2023-09-16 DIAGNOSIS — N2581 Secondary hyperparathyroidism of renal origin: Secondary | ICD-10-CM | POA: Diagnosis not present

## 2023-09-16 DIAGNOSIS — N186 End stage renal disease: Secondary | ICD-10-CM | POA: Diagnosis not present

## 2023-09-18 ENCOUNTER — Ambulatory Visit: Attending: Cardiology | Admitting: Cardiology

## 2023-09-18 ENCOUNTER — Encounter: Payer: Self-pay | Admitting: Cardiology

## 2023-09-18 ENCOUNTER — Other Ambulatory Visit: Payer: Self-pay

## 2023-09-18 VITALS — BP 110/78 | HR 58 | Ht 70.0 in | Wt 196.4 lb

## 2023-09-18 DIAGNOSIS — I10 Essential (primary) hypertension: Secondary | ICD-10-CM | POA: Insufficient documentation

## 2023-09-18 DIAGNOSIS — I5022 Chronic systolic (congestive) heart failure: Secondary | ICD-10-CM | POA: Insufficient documentation

## 2023-09-18 DIAGNOSIS — I4891 Unspecified atrial fibrillation: Secondary | ICD-10-CM | POA: Insufficient documentation

## 2023-09-18 DIAGNOSIS — E782 Mixed hyperlipidemia: Secondary | ICD-10-CM | POA: Diagnosis present

## 2023-09-18 DIAGNOSIS — I2510 Atherosclerotic heart disease of native coronary artery without angina pectoris: Secondary | ICD-10-CM | POA: Diagnosis not present

## 2023-09-18 MED ORDER — AMIODARONE HCL 200 MG PO TABS: ORAL_TABLET | ORAL | 0 refills | Status: DC

## 2023-09-18 NOTE — Patient Instructions (Signed)
 Medication Instructions:  Continue all current medications.   Labwork: none  Testing/Procedures: none  Follow-Up: 6 months   Any Other Special Instructions Will Be Listed Below (If Applicable).   If you need a refill on your cardiac medications before your next appointment, please call your pharmacy.

## 2023-09-18 NOTE — Progress Notes (Signed)
 Clinical Summary Mr. Schetter is a 63 y.o.male seen today for follow up of the following medical problems.      1. Chronic systolic HF/NICM - echo in 2015 LVEF 25%. echo 08/2015 LVEF has improved to 50-55%. - has had some of his CHF meds decreased due to low bp's on HD.  - has ICD, since LVEF recovered did not have gen change and now inactive.     08/2016 echo LVEF 55%. , grade I diasotlic function.   08/2019 echo LVEF 50%, grade Idd - 08/2022 echo: LVEF 30-35% 11/2022 echo LVEF 40%  - 04/2023 echo: LVEF 40%, grade I dd - 04/2023 cath: LM 45%, prox LAD to mid LAD 85%, D1 95%, LCX distal 85% and and 100%, OM1 100%, RCA ostial 80% and prox to mid 80%. Bifurcation stenting of LAD with a DES and D1 with DES. Had staged PCI to RCA with DES x 2  - Fluid managed by HD  - medical therapy limited by low bp's particularly with HD, orthostatic hypotension     - no SOB/DOE - compliant with meds       2. ESRD - takes midodrine  10mg  before HD.Ongoing issues with low bp's. Does not take coreg  before HD  - ongoing issues with low bp's, takes midodrine  before HD and one during treatment.        3. HTN - scaled back on bp meds due to low bp's on HD - currently just on coreg .  - just finishded dialysis, lower bp's today.    4. CAD - cath 2015 with diffuse nonobstructive disease, worst lesion 80% small distal LAD.    - 04/2023 cath: LM 45%, prox LAD to mid LAD 85%, D1 95%, LCX distal 85% and and 100%, OM1 100%, RCA ostial 80% and prox to mid 80%. Bifurcation stenting of LAD with a DES and D1 with DES. Had staged PCI to RCA with DES x 2 - interventional had recommended 1 year of DAPT, then plavix  monotherapy   -no recent chest pains - he on eliquis /plavix , no bleeding issues.     5. Hyperlipidemia - Jan 2019 TC 103 TG 136 HDL 38 LDL 38 04/2019 TG 187 HDL 37 LDL 84  06/2020 TC 101 HDL 37 TG 140 LDL 42   05/2021 TC 114 TG 871 HDL 36 LDL 52  04/2023 TC 78 TG 203 HDL 30 LDL 7     6.  DM2 - followed by pcp   7. Carotid stenosis - in 2019 mild bilateral disease 07/2021 mild 1-39% bilateral disease.  12/2022 1-39% bilateral disease  8. Afib - on amiodarone , eliquis  - no recent palpitations   9.PAD - prior BKA  Past Medical History:  Diagnosis Date   AICD (automatic cardioverter/defibrillator) present    [employer]   Allergic rhinitis    Anemia    Arthritis    Chronic systolic heart failure (HCC)    a. ECHO (12/2012) EF 25-30%, HK entireanteroseptal myocardium //  b.  EF 25%, diffuse HK, grade 1 diastolic dysfunction, MAC, mild LAE, normal RVSF, trivial pericardial effusion   COPD (chronic obstructive pulmonary disease) (HCC)    Diabetes mellitus type II    Diabetic nephropathy (HCC)    Diabetic neuropathy (HCC)    ESRD on hemodialysis (HCC)    started HD June 2017, goes to [facility] HD unit, Dr Edwardo   History of cardiac catheterization    a.Myoview  1/15:  There is significant left ventricular dysfunction. There  may be slight scar at the apex. There is no significant ischemia. LV Ejection Fraction: 27%  //  b. RHC/LHC (1/15) with mean RA 6, PA 47/22 mean 33, mean PCWP 20, PVR 2.5 WU, CI 2.5; 80% dLAD stenosis, 70% diffuse large D.     History of kidney stones    Hyperlipidemia    Hypertension    Kidney stones    NICM (nonischemic cardiomyopathy) (HCC)    Primarily nonischemic.  Echo (12/14) with EF 25-30%.  Echo (3/15) with EF 25%, mild to moderately dilated LV, normal RV size and systolic function.     Osteomyelitis (HCC)    left fifth ray   Pneumonia    Urethral stricture    Wears glasses      Allergies  Allergen Reactions   Epoetin  Alfa Other (See Comments)    Unknown    Ferumoxytol  Other (See Comments)    Unknown    Morphine Sulfate Rash and Other (See Comments)    Itches all over, red spots     Current Outpatient Medications  Medication Sig Dispense Refill   acetaminophen  (TYLENOL ) 325 MG tablet Take 1-2 tablets (325-650 mg  total) by mouth every 4 (four) hours as needed for mild pain.     amiodarone  (PACERONE ) 200 MG tablet TAKE 1 TABLET ( 200 MG ) BY MOUTH TWICE DAILY FOR 20 DAYS, THEN 1 TABLET ( 200 MG TOTAL ) ONCE DAILY 90 tablet 0   apixaban  (ELIQUIS ) 5 MG TABS tablet Take 1 tablet (5 mg total) by mouth 2 (two) times daily. 60 tablet 1   atorvastatin  (LIPITOR ) 80 MG tablet TAKE 1 TABLET BY MOUTH AT BEDTIME 90 tablet 0   carvedilol  (COREG ) 6.25 MG tablet Take 1 tablet (6.25 mg total) by mouth 2 (two) times daily. 180 tablet 1   cinacalcet  (SENSIPAR ) 30 MG tablet Take 1 tablet (30 mg total) by mouth Every Tuesday,Thursday,and Saturday with dialysis. 60 tablet    clopidogrel  (PLAVIX ) 75 MG tablet Take 1 tablet (75 mg total) by mouth daily with breakfast. 30 tablet 2   Continuous Glucose Receiver (FREESTYLE LIBRE 3 READER) DEVI Use to check blood glucose TID 1 each 0   Continuous Glucose Sensor (FREESTYLE LIBRE 3 PLUS SENSOR) MISC USE AS DIRECTED TO CHECK GLUCOSE DAILY. CHANGE EVERY 15 DAYS 2 each 6   Elastic Bandages & Supports (ABDOMINAL BINDER/ELASTIC MED) MISC 1 kit by Does not apply route daily. 1 each 0   gabapentin  (NEURONTIN ) 300 MG capsule Take 1 capsule (300 mg total) by mouth at bedtime. 30 capsule 2   glucose blood test strip Check 1 time daily. E11.9 One Touch Ultra Blue Test Strips 100 each 3   HUMALOG  KWIKPEN 200 UNIT/ML KwikPen INJECT A MAXIMUM OF 28 UNITS SUBCUTANEOUSLY TWICE DAILY WITH LUNCH AND SUPPER PER SLIDING SCALE. APPOINTMENT REQUIRED FOR FUTURE REFILLS 6 mL 5   Insulin  Pen Needle (BD PEN NEEDLE NANO U/F) 32G X 4 MM MISC USE 1 PEN NEEDLE SUBCUTANEOUSLY WITH INSULIN  4 TIMES DAILY 400 each 0   LANTUS  SOLOSTAR 100 UNIT/ML Solostar Pen INJECT 12 UNITS SUBCUTANEOUSLY AT BEDTIME (Patient taking differently: Inject 14 Units into the skin at bedtime.) 15 mL 0   midodrine  (PROAMATINE ) 10 MG tablet Take 1 tablet (10 mg total) by mouth every 8 (eight) hours. 90 tablet 1   multivitamin (RENA-VIT) TABS  tablet Take 1 tablet by mouth once daily (Patient taking differently: Take 1 tablet by mouth daily.) 10 tablet 3   nitroGLYCERIN  (NITROSTAT ) 0.4 MG  SL tablet Place 1 tablet (0.4 mg total) under the tongue every 5 (five) minutes as needed for chest pain. 30 tablet 1   Olopatadine  HCl 0.2 % SOLN Place 1 drop into both eyes daily as needed (for allergies).      pantoprazole  (PROTONIX ) 40 MG tablet Take 1 tablet (40 mg total) by mouth every 12 (twelve) hours. 60 tablet 1   VENTOLIN  HFA 108 (90 Base) MCG/ACT inhaler INHALE 1 TO 2 PUFFS BY MOUTH EVERY 6 HOURS AS NEEDED FOR WHEEZING FOR SHORTNESS OF BREATH 18 g 0   No current facility-administered medications for this visit.     Past Surgical History:  Procedure Laterality Date   ABDOMINAL AORTOGRAM W/LOWER EXTREMITY N/A 03/30/2016   Procedure: Abdominal Aortogram w/Lower Extremity;  Surgeon: Lonni GORMAN Blade, MD;  Location: [facility] INVASIVE CV LAB;  Service: Cardiovascular;  Laterality: N/A;   AMPUTATION Right 04/26/2016   Procedure: Right Below Knee Amputation;  Surgeon: Jerona Harden GAILS, MD;  Location: [facility] OR;  Service: Orthopedics;  Laterality: Right;   AMPUTATION Left 08/21/2019   Procedure: LEFT FOOT 5TH RAY AMPUTATION;  Surgeon: Harden Jerona GAILS, MD;  Location: [facility] OR;  Service: Orthopedics;  Laterality: Left;   AMPUTATION Left 11/13/2019   Procedure: LEFT BELOW KNEE AMPUTATION;  Surgeon: Harden Jerona GAILS, MD;  Location: [facility] OR;  Service: Orthopedics;  Laterality: Left;   AV FISTULA PLACEMENT Right 09/08/2015   Procedure: INSERTION OF 4-52mm x 45cm  ARTERIOVENOUS (AV) GORE-TEX GRAFT RIGHT UPPER  ARM;  Surgeon: Lonni GORMAN Blade, MD;  Location: [facility] OR;  Service: Vascular;  Laterality: Right;   AV FISTULA PLACEMENT Left 01/14/2016   Procedure: CREATION OF LEFT UPPER ARM ARTERIOVENOUS FISTULA;  Surgeon: Lonni GORMAN Blade, MD;  Location: [facility] OR;  Service: Vascular;  Laterality: Left;   BASCILIC VEIN TRANSPOSITION Right 08/22/2014   Procedure: RIGHT UPPER ARM  BASCILIC VEIN TRANSPOSITION;  Surgeon: Lonni GORMAN Blade, MD;  Location: [facility] OR;  Service: Vascular;  Laterality: Right;   BELOW KNEE LEG AMPUTATION Right 04/26/2016   BIOPSY OF SKIN SUBCUTANEOUS TISSUE AND/OR MUCOUS MEMBRANE  04/28/2023   Procedure: BIOPSY, SKIN, SUBCUTANEOUS TISSUE, OR MUCOUS MEMBRANE;  Surgeon: Shila Gustav GAILS, MD;  Location: [facility] ENDOSCOPY;  Service: Gastroenterology;;   CARDIAC CATHETERIZATION     CARDIAC DEFIBRILLATOR PLACEMENT  06/27/2013   Sub Q       BY DR FERNANDE   CATARACT EXTRACTION W/PHACO Right 08/06/2018   Procedure: CATARACT EXTRACTION PHACO AND INTRAOCULAR LENS PLACEMENT (IOC);  Surgeon: Harrie Agent, MD;  Location: [facility] ORS;  Service: Ophthalmology;  Laterality: Right;  CDE: 4.06   CATARACT EXTRACTION W/PHACO Left 08/20/2018   Procedure: CATARACT EXTRACTION PHACO AND INTRAOCULAR LENS PLACEMENT (IOC);  Surgeon: Harrie Agent, MD;  Location: [facility] ORS;  Service: Ophthalmology;  Laterality: Left;  CDE: 6.76   COLONOSCOPY WITH PROPOFOL  N/A 07/22/2015   Procedure: COLONOSCOPY WITH PROPOFOL ;  Surgeon: Victory LITTIE Legrand DOUGLAS, MD;  Location: [facility] ENDOSCOPY;  Service: Gastroenterology;  Laterality: N/A;   CORONARY LITHOTRIPSY N/A 04/14/2023   Procedure: CORONARY LITHOTRIPSY;  Surgeon: Anner Alm ORN, MD;  Location: [facility] INVASIVE CV LAB;  Service: Cardiovascular;  Laterality: N/A;  RCA   CORONARY STENT INTERVENTION N/A 04/12/2023   Procedure: CORONARY STENT INTERVENTION;  Surgeon: Swaziland, Peter M, MD;  Location: [facility] INVASIVE CV LAB;  Service: Cardiovascular;  Laterality: N/A;   CORONARY STENT INTERVENTION N/A 04/14/2023   Procedure: CORONARY STENT INTERVENTION;  Surgeon: Anner Alm ORN, MD;  Location: [facility] INVASIVE CV LAB;  Service:  Cardiovascular;  Laterality: N/A;   ESOPHAGOGASTRODUODENOSCOPY N/A 04/28/2023   Procedure: EGD (ESOPHAGOGASTRODUODENOSCOPY);  Surgeon: Nandigam, Kavitha V, MD;  Location: [facility] ENDOSCOPY;  Service: Gastroenterology;  Laterality: N/A;   FEMORAL-POPLITEAL BYPASS  GRAFT Right 03/31/2016   Procedure: BYPASS GRAFT FEMORAL-POPLITEAL ARTERY USING RIGHT GREATER SAPHENOUS NONREVERSED VEIN;  Surgeon: Lonni GORMAN Blade, MD;  Location: [facility] OR;  Service: Vascular;  Laterality: Right;   HERNIA REPAIR     I & D EXTREMITY Right 03/31/2016   Procedure: IRRIGATION AND DEBRIDEMENT FOOT;  Surgeon: Lonni GORMAN Blade, MD;  Location: [facility] OR;  Service: Vascular;  Laterality: Right;   IMPLANTABLE CARDIOVERTER DEFIBRILLATOR IMPLANT N/A 06/27/2013   Procedure: SUB Q ICD;  Surgeon: Elspeth JAYSON Sage, MD;  Location: [facility] CATH LAB;  Service: Cardiovascular;  Laterality: N/A;   INTRAOPERATIVE ARTERIOGRAM Right 03/31/2016   Procedure: INTRA OPERATIVE ARTERIOGRAM;  Surgeon: Lonni GORMAN Blade, MD;  Location: [facility] OR;  Service: Vascular;  Laterality: Right;   IR GENERIC HISTORICAL Right 11/30/2015   IR THROMBECTOMY AV FISTULA W/THROMBOLYSIS/PTA INC/SHUNT/IMG RIGHT 11/30/2015 Marcey Moan, MD [facility]INTER[facility]   IR GENERIC HISTORICAL  11/30/2015   IR US  GUIDE VASC ACCESS RIGHT 11/30/2015 Marcey Moan, MD [facility]   IR GENERIC HISTORICAL Right 12/15/2015   IR THROMBECTOMY AV FISTULA W/THROMBOLYSIS/PTA/STENT INC/SHUNT/IMG RT 12/15/2015 Toribio Faes, MD [facility]   IR GENERIC HISTORICAL  12/15/2015   IR US  GUIDE VASC ACCESS RIGHT 12/15/2015 Toribio Faes, MD [facility]   IR GENERIC HISTORICAL  12/28/2015   IR FLUORO GUIDE CV LINE RIGHT 12/28/2015 Rome Hall, MD [facility]   IR GENERIC HISTORICAL  12/28/2015   IR US  GUIDE VASC ACCESS RIGHT 12/28/2015 Rome Hall, MD [facility]   LEFT A ND RIGHT HEART CATH  01/30/2013   DR CHET   LEFT AND RIGHT HEART CATHETERIZATION WITH CORONARY ANGIOGRAM N/A 01/30/2013   Procedure: LEFT AND RIGHT HEART CATHETERIZATION WITH CORONARY ANGIOGRAM;  Surgeon: Toribio JONELLE Fuel, MD;  Location: [facility] CATH LAB;  Service: Cardiovascular;  Laterality: N/A;   LEFT HEART CATH AND CORONARY ANGIOGRAPHY N/A 04/12/2023   Procedure: LEFT HEART CATH AND  CORONARY ANGIOGRAPHY;  Surgeon: Swaziland, Peter M, MD;  Location: [facility] INVASIVE CV LAB;  Service: Cardiovascular;  Laterality: N/A;   PERIPHERAL VASCULAR CATHETERIZATION Right 01/26/2015   Procedure: A/V Fistulagram;  Surgeon: Lonni GORMAN Blade, MD;  Location: [facility] INVASIVE CV LAB;  Service: Cardiovascular;  Laterality: Right;   reapea urethral surgery for recurrent obstruction  2011   TOTAL KNEE ARTHROPLASTY Right 2007   VEIN HARVEST Right 03/31/2016   Procedure: RIGHT GREATER SAPHENOUS VEIN HARVEST;  Surgeon: Lonni GORMAN Blade, MD;  Location: [facility] OR;  Service: Vascular;  Laterality: Right;     Allergies  Allergen Reactions   Epoetin  Alfa Other (See Comments)    Unknown    Ferumoxytol  Other (See Comments)    Unknown    Morphine Sulfate Rash and Other (See Comments)    Itches all over, red spots      Family History  Problem Relation Age of Onset   Bladder Cancer Mother    Alcohol  abuse Father    Melanoma Father    Stroke Maternal Grandmother    Heart Problems Maternal Grandmother        unknown   Diabetes Maternal Grandmother    Heart disease Maternal Grandfather    Prostate cancer Maternal Grandfather      Social History Mr. Demello reports that he quit smoking about 13 years ago. His smoking use included cigarettes.  He started smoking about 45 years ago. He has a 64 pack-year smoking history. He has never used smokeless tobacco. Mr. Thal reports no history of alcohol  use.    Physical Examination Today's Vitals   09/18/23 1402  BP: 110/78  Pulse: (!) 58  SpO2: 96%  Weight: 196 lb 6.4 oz (89.1 kg)  Height: 5' 10 (1.778 m)   Body mass index is 28.18 kg/m.  Gen: resting comfortably, no acute distress HEENT: no scleral icterus, pupils equal round and reactive, no palptable cervical adenopathy,  CV: RRR, no m/rg, no jvd Resp: Clear to auscultation bilaterally GI: abdomen is soft, non-tender, non-distended, normal bowel sounds, no hepatosplenomegaly MSK:  extremities are warm, no edema.  Skin: warm, no rash Neuro:  no focal deficits Psych: appropriate affect   Diagnostic Studies  08/2015 echo Study Conclusions   - Left ventricle: The cavity size was normal. Systolic function was   normal. The estimated ejection fraction was in the range of 50%   to 55%. Wall motion was normal; there were no regional wall   motion abnormalities. Doppler parameters are consistent with   abnormal left ventricular relaxation (grade 1 diastolic   dysfunction). - Left atrium: The atrium was mildly dilated. - Atrial septum: No defect or patent foramen ovale was identified.   Jan 2015 cath Findings:   RA = 6 RV = 48/5/7 PA =  47/22 (33) PCW = 20 Fick cardiac output/index = 5.2/2.5 PVR = 2.5 WU SVR = 1192 FA sat = 98% PA sat = 65%, 68%   Ao Pressure: 116/64 (84) LV Pressure: 122/13/18 There was no signficant gradient across the aortic valve on pullback.   Left main: Mild ostial tapering otherwise ok   LAD: Narrow vessel (due to diffuse diabetic vasculopathy). 30% ostial. 40-50% tubular lesion  in the mid section. 80% lesion distally. Large diagonal with moderate diffuse disease and 70% lesion in midsection   LCX: Non dominant vessel with diffuse diabetic vasculopathy. Large OM-1. Small OM-2 and OM-3. 2 PLs. 30% mid AV groove CX lesion. 40% lesion in proximal OM-1   RCA: Large dominant vessel with just mild plaque   Assessment: 1. CAD with diffuse diabetic vasculopathy. Only high grade lesion is in small distal LAD 2. Well-compensated hemodynamics   Assessment and Plan  1. Chronic HFmrEF - medical therapy has been limited due ESRD and low bp's during HD requiring midodrine .  - fluid managed by HD - euvolemic today without symptoms, continue current therapy.    2. HTN - issues with low bp's during HD, has to take midodrine  prior to sessions Bp at goal, continue current meds   3. CAD - no symptoms, continue current meds. Plavix  at least  until 04/2024.    4. Carotid stenosis - mild bilateral disease by US  -continue to monitor   5. Hyperlipidemia -LDL well controlled, continue current meds  6. Afib/acquired thrombophilia - no symptoms, conitnue current meds incluiding amidoarone and eliquis    Dorn PHEBE Ross, M.D.

## 2023-09-19 DIAGNOSIS — N250 Renal osteodystrophy: Secondary | ICD-10-CM | POA: Diagnosis not present

## 2023-09-19 DIAGNOSIS — Z992 Dependence on renal dialysis: Secondary | ICD-10-CM | POA: Diagnosis not present

## 2023-09-19 DIAGNOSIS — N2581 Secondary hyperparathyroidism of renal origin: Secondary | ICD-10-CM | POA: Diagnosis not present

## 2023-09-19 DIAGNOSIS — N186 End stage renal disease: Secondary | ICD-10-CM | POA: Diagnosis not present

## 2023-09-19 DIAGNOSIS — D509 Iron deficiency anemia, unspecified: Secondary | ICD-10-CM | POA: Diagnosis not present

## 2023-09-20 ENCOUNTER — Encounter: Payer: Self-pay | Admitting: Family Medicine

## 2023-09-20 ENCOUNTER — Ambulatory Visit: Admitting: Family Medicine

## 2023-09-20 VITALS — BP 104/60 | HR 60 | Temp 97.5°F | Wt 197.9 lb

## 2023-09-20 DIAGNOSIS — E785 Hyperlipidemia, unspecified: Secondary | ICD-10-CM | POA: Diagnosis not present

## 2023-09-20 DIAGNOSIS — E1142 Type 2 diabetes mellitus with diabetic polyneuropathy: Secondary | ICD-10-CM

## 2023-09-20 DIAGNOSIS — R202 Paresthesia of skin: Secondary | ICD-10-CM | POA: Diagnosis not present

## 2023-09-20 DIAGNOSIS — I2510 Atherosclerotic heart disease of native coronary artery without angina pectoris: Secondary | ICD-10-CM | POA: Diagnosis not present

## 2023-09-20 DIAGNOSIS — R251 Tremor, unspecified: Secondary | ICD-10-CM

## 2023-09-20 DIAGNOSIS — Z794 Long term (current) use of insulin: Secondary | ICD-10-CM

## 2023-09-20 LAB — POCT GLYCOSYLATED HEMOGLOBIN (HGB A1C): Hemoglobin A1C: 6.7 % — AB (ref 4.0–5.6)

## 2023-09-20 MED ORDER — ATORVASTATIN CALCIUM 80 MG PO TABS: 80.0000 mg | ORAL_TABLET | Freq: Every day | ORAL | 3 refills | Status: DC

## 2023-09-20 MED ORDER — MIDODRINE HCL 10 MG PO TABS: 10.0000 mg | ORAL_TABLET | Freq: Three times a day (TID) | ORAL | 1 refills | Status: DC

## 2023-09-20 MED ORDER — CARVEDILOL 6.25 MG PO TABS: 6.2500 mg | ORAL_TABLET | Freq: Two times a day (BID) | ORAL | 3 refills | Status: DC

## 2023-09-20 NOTE — Patient Instructions (Signed)
 A1C still well controlled at 6.7%.  Let's plan on 3 month follow up.

## 2023-09-20 NOTE — Progress Notes (Signed)
 Established Patient Office Visit  Subjective   Patient ID: BRITTANY AMIRAULT, male    DOB: 1960-03-19  Age: 63 y.o. MRN: [id number]  Chief Complaint  Patient presents with   Medical Management of Chronic Issues    HPI   Anthony Bullock seen for medical follow-up.  Turns 63 today.  Longstanding history of diabetes with long history of poor control but recently improved.  He has been a little bit more relaxed with his insulin  recently and A1c today up to 6.7% from 5.8 previously.  History of CAD.  He had stents placed back in the spring.  No recent chest pains.  Just recently saw cardiologist.  Remains on high-dose statin.  He is requesting refills of several medications today including carvedilol , Lipitor , and midodrine .  He had several weeks of issues with volume overload and that seems to be stabilized at this time.  Still getting dialysis every Tuesday, Thursday, and Saturday.  Still has some low blood pressure at times.  Relates a few episodes recently of bilateral upper arm tingling sensation.  1 night woke up with some tingling in both arms and little bit of tingling in his trunk region.  Denies any consistent neck pain.  Does recently noticed some left hand tremor.  No weakness.  Occasional right shoulder pain but no classic radiculitis pain.  Past Medical History:  Diagnosis Date   AICD (automatic cardioverter/defibrillator) present    [employer]   Allergic rhinitis    Anemia    Arthritis    Chronic systolic heart failure (HCC)    a. ECHO (12/2012) EF 25-30%, HK entireanteroseptal myocardium //  b.  EF 25%, diffuse HK, grade 1 diastolic dysfunction, MAC, mild LAE, normal RVSF, trivial pericardial effusion   COPD (chronic obstructive pulmonary disease) (HCC)    Diabetes mellitus type II    Diabetic nephropathy (HCC)    Diabetic neuropathy (HCC)    ESRD on hemodialysis (HCC)    started HD June 2017, goes to [facility] HD unit, Dr Edwardo   History of cardiac catheterization     a.Myoview  1/15:  There is significant left ventricular dysfunction. There may be slight scar at the apex. There is no significant ischemia. LV Ejection Fraction: 27%  //  b. RHC/LHC (1/15) with mean RA 6, PA 47/22 mean 33, mean PCWP 20, PVR 2.5 WU, CI 2.5; 80% dLAD stenosis, 70% diffuse large D.     History of kidney stones    Hyperlipidemia    Hypertension    Kidney stones    NICM (nonischemic cardiomyopathy) (HCC)    Primarily nonischemic.  Echo (12/14) with EF 25-30%.  Echo (3/15) with EF 25%, mild to moderately dilated LV, normal RV size and systolic function.     Osteomyelitis (HCC)    left fifth ray   Pneumonia    Urethral stricture    Wears glasses    Past Surgical History:  Procedure Laterality Date   ABDOMINAL AORTOGRAM W/LOWER EXTREMITY N/A 03/30/2016   Procedure: Abdominal Aortogram w/Lower Extremity;  Surgeon: Lonni GORMAN Blade, MD;  Location[facility] INVASIVE CV LAB;  Service: Cardiovascular;  Laterality: N/A;   AMPUTATION Right 04/26/2016   Procedure: Right Below Knee Amputation;  Surgeon: Jerona Harden GAILS, MD;  Location: [facility] OR;  Service: Orthopedics;  Laterality: Right;   AMPUTATION Left 08/21/2019   Procedure: LEFT FOOT 5TH RAY AMPUTATION;  Surgeon: Harden Jerona GAILS, MD;  Location: [facility] OR;  Service: Orthopedics;  Laterality: Left;   AMPUTATION Left 11/13/2019  Procedure: LEFT BELOW KNEE AMPUTATION;  Surgeon: Harden Jerona GAILS, MD;  Location: [facility] OR;  Service: Orthopedics;  Laterality: Left;   AV FISTULA PLACEMENT Right 09/08/2015   Procedure: INSERTION OF 4-81mm x 45cm  ARTERIOVENOUS (AV) GORE-TEX GRAFT RIGHT UPPER  ARM;  Surgeon: Lonni GORMAN Blade, MD;  Location: [facility] OR;  Service: Vascular;  Laterality: Right;   AV FISTULA PLACEMENT Left 01/14/2016   Procedure: CREATION OF LEFT UPPER ARM ARTERIOVENOUS FISTULA;  Surgeon: Lonni GORMAN Blade, MD;  Location: [facility] OR;  Service: Vascular;  Laterality: Left;   BASCILIC VEIN TRANSPOSITION Right 08/22/2014   Procedure: RIGHT UPPER ARM BASCILIC  VEIN TRANSPOSITION;  Surgeon: Lonni GORMAN Blade, MD;  Location: [facility] OR;  Service: Vascular;  Laterality: Right;   BELOW KNEE LEG AMPUTATION Right 04/26/2016   BIOPSY OF SKIN SUBCUTANEOUS TISSUE AND/OR MUCOUS MEMBRANE  04/28/2023   Procedure: BIOPSY, SKIN, SUBCUTANEOUS TISSUE, OR MUCOUS MEMBRANE;  Surgeon: Shila Gustav GAILS, MD;  Location: [facility] ENDOSCOPY;  Service: Gastroenterology;;   CARDIAC CATHETERIZATION     CARDIAC DEFIBRILLATOR PLACEMENT  06/27/2013   Sub Q       BY DR FERNANDE   CATARACT EXTRACTION W/PHACO Right 08/06/2018   Procedure: CATARACT EXTRACTION PHACO AND INTRAOCULAR LENS PLACEMENT (IOC);  Surgeon: Harrie Agent, MD;  Location: [facility] ORS;  Service: Ophthalmology;  Laterality: Right;  CDE: 4.06   CATARACT EXTRACTION W/PHACO Left 08/20/2018   Procedure: CATARACT EXTRACTION PHACO AND INTRAOCULAR LENS PLACEMENT (IOC);  Surgeon: Harrie Agent, MD;  Location: [facility] ORS;  Service: Ophthalmology;  Laterality: Left;  CDE: 6.76   COLONOSCOPY WITH PROPOFOL  N/A 07/22/2015   Procedure: COLONOSCOPY WITH PROPOFOL ;  Surgeon: Victory LITTIE Legrand DOUGLAS, MD;  Location: [facility] ENDOSCOPY;  Service: Gastroenterology;  Laterality: N/A;   CORONARY LITHOTRIPSY N/A 04/14/2023   Procedure: CORONARY LITHOTRIPSY;  Surgeon: Anner Alm ORN, MD;  Location: [facility] INVASIVE CV LAB;  Service: Cardiovascular;  Laterality: N/A;  RCA   CORONARY STENT INTERVENTION N/A 04/12/2023   Procedure: CORONARY STENT INTERVENTION;  Surgeon: Swaziland, Peter M, MD;  Location: [facility] INVASIVE CV LAB;  Service: Cardiovascular;  Laterality: N/A;   CORONARY STENT INTERVENTION N/A 04/14/2023   Procedure: CORONARY STENT INTERVENTION;  Surgeon: Anner Alm ORN, MD;  Location: [facility] INVASIVE CV LAB;  Service: Cardiovascular;  Laterality: N/A;   ESOPHAGOGASTRODUODENOSCOPY N/A 04/28/2023   Procedure: EGD (ESOPHAGOGASTRODUODENOSCOPY);  Surgeon: Nandigam, Kavitha V, MD;  Location: [facility] ENDOSCOPY;  Service: Gastroenterology;  Laterality: N/A;   FEMORAL-POPLITEAL BYPASS GRAFT Right  03/31/2016   Procedure: BYPASS GRAFT FEMORAL-POPLITEAL ARTERY USING RIGHT GREATER SAPHENOUS NONREVERSED VEIN;  Surgeon: Lonni GORMAN Blade, MD;  Location: [facility] OR;  Service: Vascular;  Laterality: Right;   HERNIA REPAIR     I & D EXTREMITY Right 03/31/2016   Procedure: IRRIGATION AND DEBRIDEMENT FOOT;  Surgeon: Lonni GORMAN Blade, MD;  Location: [facility] OR;  Service: Vascular;  Laterality: Right;   IMPLANTABLE CARDIOVERTER DEFIBRILLATOR IMPLANT N/A 06/27/2013   Procedure: SUB Q ICD;  Surgeon: Elspeth JAYSON FERNANDE, MD;  Location: [facility] CATH LAB;  Service: Cardiovascular;  Laterality: N/A;   INTRAOPERATIVE ARTERIOGRAM Right 03/31/2016   Procedure: INTRA OPERATIVE ARTERIOGRAM;  Surgeon: Lonni GORMAN Blade, MD;  Location: [facility] OR;  Service: Vascular;  Laterality: Right;   IR GENERIC HISTORICAL Right 11/30/2015   IR THROMBECTOMY AV FISTULA W/THROMBOLYSIS/PTA INC/SHUNT/IMG RIGHT 11/30/2015 Marcey Moan, MD [facility]-INTER[facility] RAD   IR GENERIC HISTORICAL  11/30/2015   IR US  GUIDE VASC ACCESS RIGHT 11/30/2015 Marcey Moan, MD [facility]   IR GENERIC HISTORICAL Right 12/15/2015  IR THROMBECTOMY AV FISTULA W/THROMBOLYSIS/PTA/STENT INC/SHUNT/IMG RT 12/15/2015 Toribio Faes, MD [facility]   IR GENERIC HISTORICAL  12/15/2015   IR US  GUIDE VASC ACCESS RIGHT 12/15/2015 Toribio Faes, MD [facility]   IR GENERIC HISTORICAL  12/28/2015   IR FLUORO GUIDE CV LINE RIGHT 12/28/2015 Rome Hall, MD [facility]   IR GENERIC HISTORICAL  12/28/2015   IR US  GUIDE VASC ACCESS RIGHT 12/28/2015 Rome Hall, MD [facility]   LEFT A ND RIGHT HEART CATH  01/30/2013   DR CHET   LEFT AND RIGHT HEART CATHETERIZATION WITH CORONARY ANGIOGRAM N/A 01/30/2013   Procedure: LEFT AND RIGHT HEART CATHETERIZATION WITH CORONARY ANGIOGRAM;  Surgeon: Toribio JONELLE Fuel, MD;  Location: [facility] CATH LAB;  Service: Cardiovascular;  Laterality: N/A;   LEFT HEART CATH AND CORONARY ANGIOGRAPHY N/A 04/12/2023   Procedure: LEFT HEART CATH AND CORONARY  ANGIOGRAPHY;  Surgeon: Swaziland, Peter M, MD;  Location: [facility] INVASIVE CV LAB;  Service: Cardiovascular;  Laterality: N/A;   PERIPHERAL VASCULAR CATHETERIZATION Right 01/26/2015   Procedure: A/V Fistulagram;  Surgeon: Lonni GORMAN Blade, MD;  Location: [facility] INVASIVE CV LAB;  Service: Cardiovascular;  Laterality: Right;   reapea urethral surgery for recurrent obstruction  2011   TOTAL KNEE ARTHROPLASTY Right 2007   VEIN HARVEST Right 03/31/2016   Procedure: RIGHT GREATER SAPHENOUS VEIN HARVEST;  Surgeon: Lonni GORMAN Blade, MD;  Location: [facility] OR;  Service: Vascular;  Laterality: Right;    reports that he quit smoking about 13 years ago. His smoking use included cigarettes. He started smoking about 45 years ago. He has a 64 pack-year smoking history. He has never used smokeless tobacco. He reports that he does not drink alcohol  and does not use drugs. family history includes Alcohol  abuse in his father; Bladder Cancer in his mother; Diabetes in his maternal grandmother; Heart Problems in his maternal grandmother; Heart disease in his maternal grandfather; Melanoma in his father; Prostate cancer in his maternal grandfather; Stroke in his maternal grandmother. Allergies  Allergen Reactions   Epoetin  Alfa Other (See Comments)    Unknown    Ferumoxytol  Other (See Comments)    Unknown    Morphine Sulfate Rash and Other (See Comments)    Itches all over, red spots    Review of Systems  Constitutional:  Negative for chills and fever.  Eyes:  Negative for blurred vision.  Respiratory:  Negative for shortness of breath.   Cardiovascular:  Negative for chest pain.  Gastrointestinal:  Negative for abdominal pain.  Neurological:  Positive for tingling and tremors. Negative for dizziness, focal weakness, weakness and headaches.      Objective:     BP 104/60   Pulse 60   Temp (!) 97.5 F (36.4 C) (Oral)   Wt 197 lb 14.4 oz (89.8 kg)   SpO2 97%   BMI 28.40 kg/m  BP Readings from Last 3  Encounters:  09/20/23 104/60  09/18/23 110/78  06/09/23 122/72   Wt Readings from Last 3 Encounters:  09/20/23 197 lb 14.4 oz (89.8 kg)  09/18/23 196 lb 6.4 oz (89.1 kg)  06/09/23 191 lb (86.6 kg)      Physical Exam Vitals reviewed.  Constitutional:      General: He is not in acute distress.    Appearance: He is not ill-appearing.  Cardiovascular:     Rate and Rhythm: Normal rate.  Pulmonary:     Effort: Pulmonary effort is normal.     Breath sounds: Normal breath sounds. No wheezing.  Neurological:  Mental Status: He is alert.     Comments: Does have tremor left upper extremity at rest.  No obvious right upper extremity tremor.  No upper extremity weakness.  He has 1+ symmetric upper extremity reflexes throughout      Results for orders placed or performed in visit on 09/20/23  POC HgB A1c  Result Value Ref Range   Hemoglobin A1C 6.7 (A) 4.0 - 5.6 %   HbA1c POC (<> result, manual entry)     HbA1c, POC (prediabetic range)     HbA1c, POC (controlled diabetic range)      Last CBC Lab Results  Component Value Date   WBC 12.6 (H) 05/22/2023   HGB 11.5 (L) 05/22/2023   HCT 36.9 (L) 05/22/2023   MCV 93.2 05/22/2023   MCH 29.0 05/22/2023   RDW 14.7 05/22/2023   PLT 203 05/22/2023   Last metabolic panel Lab Results  Component Value Date   GLUCOSE 153 (H) 06/19/2023   NA 137 06/19/2023   K 4.0 06/19/2023   CL 96 06/19/2023   CO2 27 06/19/2023   BUN 52 (H) 06/19/2023   CREATININE 10.14 (HH) 06/19/2023   GFR 5.03 (LL) 06/19/2023   CALCIUM  9.0 06/19/2023   PHOS 5.0 (H) 05/02/2023   PROT 7.1 06/19/2023   ALBUMIN  3.7 06/19/2023   BILITOT 0.4 06/19/2023   ALKPHOS 201 (H) 06/19/2023   AST 23 06/19/2023   ALT 30 06/19/2023   ANIONGAP 11 05/22/2023   Last lipids Lab Results  Component Value Date   CHOL 78 04/11/2023   HDL 30 (L) 04/11/2023   LDLCALC 7 04/11/2023   LDLDIRECT 134.0 04/25/2011   TRIG 203 (H) 04/11/2023   CHOLHDL 2.6 04/11/2023   Last  hemoglobin A1c Lab Results  Component Value Date   HGBA1C 6.7 (A) 09/20/2023      The ASCVD Risk score (Arnett DK, et al., 2019) failed to calculate for the following reasons:   Risk score cannot be calculated because patient has a medical history suggesting prior/existing ASCVD    Assessment & Plan:   #1 type 2 diabetes.  Past history of peripheral neuropathy.  A1c today up somewhat but still controlled at 6.7%.  Continue current insulin  regimen.  Recheck in 3 months  #2 hyperlipidemia on high-dose statin.  Refill atorvastatin  for 1 year.  Lipids were checked in April with total cholesterol 78, triglycerides 203, LDL 7.    #3 recent intermittent bilateral upper extremity paresthesia.  No weakness on exam.  Normal sensory function.  Question cervical etiology.  We discussed possible CT cervical spine (he cannot have MRI) -specially for any progressive symptoms.  He agrees to observe for now but follow-up for any weakness or increased frequency of tingling and paresthesias  #4 recently noted left hand tremor.  No history of familial tremor.  Observe for now.  Consider neurology consult if progresses Return in about 3 months (around 12/20/2023).    Wolm Scarlet, MD

## 2023-09-21 DIAGNOSIS — N250 Renal osteodystrophy: Secondary | ICD-10-CM | POA: Diagnosis not present

## 2023-09-21 DIAGNOSIS — Z992 Dependence on renal dialysis: Secondary | ICD-10-CM | POA: Diagnosis not present

## 2023-09-21 DIAGNOSIS — D509 Iron deficiency anemia, unspecified: Secondary | ICD-10-CM | POA: Diagnosis not present

## 2023-09-21 DIAGNOSIS — N186 End stage renal disease: Secondary | ICD-10-CM | POA: Diagnosis not present

## 2023-09-21 DIAGNOSIS — N2581 Secondary hyperparathyroidism of renal origin: Secondary | ICD-10-CM | POA: Diagnosis not present

## 2023-09-23 DIAGNOSIS — N250 Renal osteodystrophy: Secondary | ICD-10-CM | POA: Diagnosis not present

## 2023-09-23 DIAGNOSIS — D509 Iron deficiency anemia, unspecified: Secondary | ICD-10-CM | POA: Diagnosis not present

## 2023-09-23 DIAGNOSIS — N186 End stage renal disease: Secondary | ICD-10-CM | POA: Diagnosis not present

## 2023-09-23 DIAGNOSIS — Z992 Dependence on renal dialysis: Secondary | ICD-10-CM | POA: Diagnosis not present

## 2023-09-23 DIAGNOSIS — N2581 Secondary hyperparathyroidism of renal origin: Secondary | ICD-10-CM | POA: Diagnosis not present

## 2023-09-26 DIAGNOSIS — N2581 Secondary hyperparathyroidism of renal origin: Secondary | ICD-10-CM | POA: Diagnosis not present

## 2023-09-26 DIAGNOSIS — N186 End stage renal disease: Secondary | ICD-10-CM | POA: Diagnosis not present

## 2023-09-26 DIAGNOSIS — N250 Renal osteodystrophy: Secondary | ICD-10-CM | POA: Diagnosis not present

## 2023-09-26 DIAGNOSIS — Z992 Dependence on renal dialysis: Secondary | ICD-10-CM | POA: Diagnosis not present

## 2023-09-26 DIAGNOSIS — D509 Iron deficiency anemia, unspecified: Secondary | ICD-10-CM | POA: Diagnosis not present

## 2023-09-28 DIAGNOSIS — N2581 Secondary hyperparathyroidism of renal origin: Secondary | ICD-10-CM | POA: Diagnosis not present

## 2023-09-28 DIAGNOSIS — N186 End stage renal disease: Secondary | ICD-10-CM | POA: Diagnosis not present

## 2023-09-28 DIAGNOSIS — Z992 Dependence on renal dialysis: Secondary | ICD-10-CM | POA: Diagnosis not present

## 2023-09-28 DIAGNOSIS — N250 Renal osteodystrophy: Secondary | ICD-10-CM | POA: Diagnosis not present

## 2023-09-28 DIAGNOSIS — D509 Iron deficiency anemia, unspecified: Secondary | ICD-10-CM | POA: Diagnosis not present

## 2023-09-29 DIAGNOSIS — Z992 Dependence on renal dialysis: Secondary | ICD-10-CM | POA: Diagnosis not present

## 2023-09-29 DIAGNOSIS — N186 End stage renal disease: Secondary | ICD-10-CM | POA: Diagnosis not present

## 2023-09-29 DIAGNOSIS — N2581 Secondary hyperparathyroidism of renal origin: Secondary | ICD-10-CM | POA: Diagnosis not present

## 2023-09-29 DIAGNOSIS — D509 Iron deficiency anemia, unspecified: Secondary | ICD-10-CM | POA: Diagnosis not present

## 2023-09-29 DIAGNOSIS — N250 Renal osteodystrophy: Secondary | ICD-10-CM | POA: Diagnosis not present

## 2023-09-30 DIAGNOSIS — Z992 Dependence on renal dialysis: Secondary | ICD-10-CM | POA: Diagnosis not present

## 2023-09-30 DIAGNOSIS — D509 Iron deficiency anemia, unspecified: Secondary | ICD-10-CM | POA: Diagnosis not present

## 2023-09-30 DIAGNOSIS — N250 Renal osteodystrophy: Secondary | ICD-10-CM | POA: Diagnosis not present

## 2023-09-30 DIAGNOSIS — N2581 Secondary hyperparathyroidism of renal origin: Secondary | ICD-10-CM | POA: Diagnosis not present

## 2023-09-30 DIAGNOSIS — M75101 Unspecified rotator cuff tear or rupture of right shoulder, not specified as traumatic: Secondary | ICD-10-CM | POA: Diagnosis not present

## 2023-09-30 DIAGNOSIS — R202 Paresthesia of skin: Secondary | ICD-10-CM | POA: Diagnosis not present

## 2023-09-30 DIAGNOSIS — N186 End stage renal disease: Secondary | ICD-10-CM | POA: Diagnosis not present

## 2023-10-03 DIAGNOSIS — Z794 Long term (current) use of insulin: Secondary | ICD-10-CM | POA: Diagnosis not present

## 2023-10-03 DIAGNOSIS — N186 End stage renal disease: Secondary | ICD-10-CM | POA: Diagnosis not present

## 2023-10-03 DIAGNOSIS — N2581 Secondary hyperparathyroidism of renal origin: Secondary | ICD-10-CM | POA: Diagnosis not present

## 2023-10-03 DIAGNOSIS — D509 Iron deficiency anemia, unspecified: Secondary | ICD-10-CM | POA: Diagnosis not present

## 2023-10-03 DIAGNOSIS — N250 Renal osteodystrophy: Secondary | ICD-10-CM | POA: Diagnosis not present

## 2023-10-03 DIAGNOSIS — E119 Type 2 diabetes mellitus without complications: Secondary | ICD-10-CM | POA: Diagnosis not present

## 2023-10-03 DIAGNOSIS — Z992 Dependence on renal dialysis: Secondary | ICD-10-CM | POA: Diagnosis not present

## 2023-10-04 ENCOUNTER — Ambulatory Visit: Admitting: Family

## 2023-10-05 ENCOUNTER — Other Ambulatory Visit (HOSPITAL_COMMUNITY): Payer: Self-pay | Admitting: Nurse Practitioner

## 2023-10-05 ENCOUNTER — Ambulatory Visit: Payer: Self-pay | Admitting: Pulmonary Disease

## 2023-10-05 ENCOUNTER — Ambulatory Visit (HOSPITAL_COMMUNITY)
Admission: RE | Admit: 2023-10-05 | Discharge: 2023-10-05 | Disposition: A | Discharge status: Home / Self Care | Source: Ambulatory Visit | Attending: Nurse Practitioner | Admitting: Nurse Practitioner

## 2023-10-05 DIAGNOSIS — I5023 Acute on chronic systolic (congestive) heart failure: Secondary | ICD-10-CM | POA: Diagnosis not present

## 2023-10-05 DIAGNOSIS — G9341 Metabolic encephalopathy: Secondary | ICD-10-CM | POA: Diagnosis not present

## 2023-10-05 DIAGNOSIS — Z9911 Dependence on respirator [ventilator] status: Secondary | ICD-10-CM | POA: Diagnosis not present

## 2023-10-05 DIAGNOSIS — J9600 Acute respiratory failure, unspecified whether with hypoxia or hypercapnia: Secondary | ICD-10-CM | POA: Diagnosis not present

## 2023-10-05 DIAGNOSIS — R0989 Other specified symptoms and signs involving the circulatory and respiratory systems: Secondary | ICD-10-CM | POA: Diagnosis not present

## 2023-10-05 DIAGNOSIS — C9000 Multiple myeloma not having achieved remission: Secondary | ICD-10-CM | POA: Diagnosis present

## 2023-10-05 DIAGNOSIS — R911 Solitary pulmonary nodule: Secondary | ICD-10-CM | POA: Diagnosis not present

## 2023-10-05 DIAGNOSIS — Z515 Encounter for palliative care: Secondary | ICD-10-CM | POA: Diagnosis not present

## 2023-10-05 DIAGNOSIS — Z7902 Long term (current) use of antithrombotics/antiplatelets: Secondary | ICD-10-CM | POA: Diagnosis not present

## 2023-10-05 DIAGNOSIS — Z89511 Acquired absence of right leg below knee: Secondary | ICD-10-CM | POA: Diagnosis not present

## 2023-10-05 DIAGNOSIS — I2510 Atherosclerotic heart disease of native coronary artery without angina pectoris: Secondary | ICD-10-CM | POA: Diagnosis not present

## 2023-10-05 DIAGNOSIS — R0602 Shortness of breath: Secondary | ICD-10-CM | POA: Diagnosis not present

## 2023-10-05 DIAGNOSIS — D65 Disseminated intravascular coagulation [defibrination syndrome]: Secondary | ICD-10-CM | POA: Diagnosis not present

## 2023-10-05 DIAGNOSIS — I5020 Unspecified systolic (congestive) heart failure: Secondary | ICD-10-CM | POA: Diagnosis not present

## 2023-10-05 DIAGNOSIS — Z4902 Encounter for fitting and adjustment of peritoneal dialysis catheter: Secondary | ICD-10-CM | POA: Diagnosis not present

## 2023-10-05 DIAGNOSIS — J151 Pneumonia due to Pseudomonas: Secondary | ICD-10-CM | POA: Diagnosis not present

## 2023-10-05 DIAGNOSIS — R0902 Hypoxemia: Secondary | ICD-10-CM | POA: Diagnosis not present

## 2023-10-05 DIAGNOSIS — Z950 Presence of cardiac pacemaker: Secondary | ICD-10-CM | POA: Diagnosis not present

## 2023-10-05 DIAGNOSIS — I6523 Occlusion and stenosis of bilateral carotid arteries: Secondary | ICD-10-CM | POA: Diagnosis not present

## 2023-10-05 DIAGNOSIS — I480 Paroxysmal atrial fibrillation: Secondary | ICD-10-CM | POA: Diagnosis not present

## 2023-10-05 DIAGNOSIS — I959 Hypotension, unspecified: Secondary | ICD-10-CM | POA: Diagnosis not present

## 2023-10-05 DIAGNOSIS — I120 Hypertensive chronic kidney disease with stage 5 chronic kidney disease or end stage renal disease: Secondary | ICD-10-CM | POA: Diagnosis not present

## 2023-10-05 DIAGNOSIS — E119 Type 2 diabetes mellitus without complications: Secondary | ICD-10-CM | POA: Diagnosis not present

## 2023-10-05 DIAGNOSIS — K567 Ileus, unspecified: Secondary | ICD-10-CM | POA: Diagnosis not present

## 2023-10-05 DIAGNOSIS — N250 Renal osteodystrophy: Secondary | ICD-10-CM | POA: Diagnosis not present

## 2023-10-05 DIAGNOSIS — E43 Unspecified severe protein-calorie malnutrition: Secondary | ICD-10-CM | POA: Diagnosis not present

## 2023-10-05 DIAGNOSIS — D509 Iron deficiency anemia, unspecified: Secondary | ICD-10-CM | POA: Diagnosis not present

## 2023-10-05 DIAGNOSIS — Z87891 Personal history of nicotine dependence: Secondary | ICD-10-CM | POA: Diagnosis not present

## 2023-10-05 DIAGNOSIS — E1165 Type 2 diabetes mellitus with hyperglycemia: Secondary | ICD-10-CM | POA: Diagnosis not present

## 2023-10-05 DIAGNOSIS — Z66 Do not resuscitate: Secondary | ICD-10-CM | POA: Diagnosis not present

## 2023-10-05 DIAGNOSIS — J9811 Atelectasis: Secondary | ICD-10-CM | POA: Diagnosis not present

## 2023-10-05 DIAGNOSIS — D631 Anemia in chronic kidney disease: Secondary | ICD-10-CM | POA: Diagnosis present

## 2023-10-05 DIAGNOSIS — J440 Chronic obstructive pulmonary disease with acute lower respiratory infection: Secondary | ICD-10-CM | POA: Diagnosis not present

## 2023-10-05 DIAGNOSIS — I469 Cardiac arrest, cause unspecified: Secondary | ICD-10-CM | POA: Diagnosis not present

## 2023-10-05 DIAGNOSIS — R222 Localized swelling, mass and lump, trunk: Secondary | ICD-10-CM | POA: Diagnosis not present

## 2023-10-05 DIAGNOSIS — J9601 Acute respiratory failure with hypoxia: Secondary | ICD-10-CM | POA: Diagnosis not present

## 2023-10-05 DIAGNOSIS — Z4682 Encounter for fitting and adjustment of non-vascular catheter: Secondary | ICD-10-CM | POA: Diagnosis not present

## 2023-10-05 DIAGNOSIS — A419 Sepsis, unspecified organism: Secondary | ICD-10-CM | POA: Diagnosis not present

## 2023-10-05 DIAGNOSIS — K6389 Other specified diseases of intestine: Secondary | ICD-10-CM | POA: Diagnosis not present

## 2023-10-05 DIAGNOSIS — E871 Hypo-osmolality and hyponatremia: Secondary | ICD-10-CM | POA: Diagnosis not present

## 2023-10-05 DIAGNOSIS — Z452 Encounter for adjustment and management of vascular access device: Secondary | ICD-10-CM | POA: Diagnosis not present

## 2023-10-05 DIAGNOSIS — I5031 Acute diastolic (congestive) heart failure: Secondary | ICD-10-CM | POA: Diagnosis not present

## 2023-10-05 DIAGNOSIS — R918 Other nonspecific abnormal finding of lung field: Secondary | ICD-10-CM | POA: Diagnosis not present

## 2023-10-05 DIAGNOSIS — R579 Shock, unspecified: Secondary | ICD-10-CM | POA: Diagnosis not present

## 2023-10-05 DIAGNOSIS — Z89512 Acquired absence of left leg below knee: Secondary | ICD-10-CM | POA: Diagnosis not present

## 2023-10-05 DIAGNOSIS — R570 Cardiogenic shock: Secondary | ICD-10-CM | POA: Diagnosis not present

## 2023-10-05 DIAGNOSIS — A4152 Sepsis due to Pseudomonas: Secondary | ICD-10-CM | POA: Diagnosis not present

## 2023-10-05 DIAGNOSIS — Z95810 Presence of automatic (implantable) cardiac defibrillator: Secondary | ICD-10-CM | POA: Diagnosis not present

## 2023-10-05 DIAGNOSIS — J690 Pneumonitis due to inhalation of food and vomit: Secondary | ICD-10-CM | POA: Diagnosis not present

## 2023-10-05 DIAGNOSIS — Z992 Dependence on renal dialysis: Secondary | ICD-10-CM | POA: Diagnosis not present

## 2023-10-05 DIAGNOSIS — E1122 Type 2 diabetes mellitus with diabetic chronic kidney disease: Secondary | ICD-10-CM | POA: Diagnosis present

## 2023-10-05 DIAGNOSIS — Z7189 Other specified counseling: Secondary | ICD-10-CM | POA: Diagnosis not present

## 2023-10-05 DIAGNOSIS — R0603 Acute respiratory distress: Secondary | ICD-10-CM | POA: Diagnosis not present

## 2023-10-05 DIAGNOSIS — Z1152 Encounter for screening for COVID-19: Secondary | ICD-10-CM | POA: Diagnosis not present

## 2023-10-05 DIAGNOSIS — Z7901 Long term (current) use of anticoagulants: Secondary | ICD-10-CM | POA: Diagnosis not present

## 2023-10-05 DIAGNOSIS — I5043 Acute on chronic combined systolic (congestive) and diastolic (congestive) heart failure: Secondary | ICD-10-CM | POA: Diagnosis not present

## 2023-10-05 DIAGNOSIS — Z48813 Encounter for surgical aftercare following surgery on the respiratory system: Secondary | ICD-10-CM | POA: Diagnosis not present

## 2023-10-05 DIAGNOSIS — N2581 Secondary hyperparathyroidism of renal origin: Secondary | ICD-10-CM | POA: Diagnosis not present

## 2023-10-05 DIAGNOSIS — I132 Hypertensive heart and chronic kidney disease with heart failure and with stage 5 chronic kidney disease, or end stage renal disease: Secondary | ICD-10-CM | POA: Diagnosis present

## 2023-10-05 DIAGNOSIS — R6521 Severe sepsis with septic shock: Secondary | ICD-10-CM | POA: Diagnosis not present

## 2023-10-05 DIAGNOSIS — C782 Secondary malignant neoplasm of pleura: Secondary | ICD-10-CM | POA: Diagnosis not present

## 2023-10-05 DIAGNOSIS — R140 Abdominal distension (gaseous): Secondary | ICD-10-CM | POA: Diagnosis not present

## 2023-10-05 DIAGNOSIS — J90 Pleural effusion, not elsewhere classified: Secondary | ICD-10-CM | POA: Diagnosis present

## 2023-10-05 DIAGNOSIS — I700 Atherosclerosis of aorta: Secondary | ICD-10-CM | POA: Diagnosis not present

## 2023-10-05 DIAGNOSIS — N289 Disorder of kidney and ureter, unspecified: Secondary | ICD-10-CM | POA: Diagnosis not present

## 2023-10-05 DIAGNOSIS — N186 End stage renal disease: Secondary | ICD-10-CM | POA: Diagnosis present

## 2023-10-05 DIAGNOSIS — I4891 Unspecified atrial fibrillation: Secondary | ICD-10-CM | POA: Diagnosis not present

## 2023-10-05 DIAGNOSIS — Y95 Nosocomial condition: Secondary | ICD-10-CM | POA: Diagnosis not present

## 2023-10-05 DIAGNOSIS — C7951 Secondary malignant neoplasm of bone: Secondary | ICD-10-CM | POA: Diagnosis present

## 2023-10-05 DIAGNOSIS — I739 Peripheral vascular disease, unspecified: Secondary | ICD-10-CM | POA: Diagnosis not present

## 2023-10-05 DIAGNOSIS — I468 Cardiac arrest due to other underlying condition: Secondary | ICD-10-CM | POA: Diagnosis not present

## 2023-10-05 DIAGNOSIS — R1110 Vomiting, unspecified: Secondary | ICD-10-CM | POA: Diagnosis not present

## 2023-10-05 NOTE — Telephone Encounter (Signed)
 FYI Only or Action Required?: FYI only for provider. Shortness of breath for the last couple of weeks. Pain to right shoulder with breathing which has been going on same length of time. More fatigue walking from room to room. Was placed on oxygen  in dialysis and felt better on oxygen .   Patient is followed in Pulmonology for OSA and COPD, last seen on 05/03/2023 by Neda Jennet LABOR, MD.  Called Nurse Triage reporting Shortness of Breath.  Symptoms began several weeks ago.  Interventions attempted: Increased fluids/rest.  Symptoms are: gradually worsening.  Triage Disposition: Go to ED Now (Notify PCP)  Patient/caregiver understands and will follow disposition?: Yes  Copied from CRM [id number]. Topic: Clinical - Red Word Triage >> Oct 05, 2023  8:38 AM Anthony Bullock wrote: Red Word that prompted transfer to Nurse Triage: Breathing issue for a few weeks (new), tired easily when walking from room to room, trouble catching breath when moving, feels better on CPAP or seated. Pain in right shoulder with breathing (mentioned a torn cuff). Tingling in left arm and elbow. Reason for Disposition  [1] MODERATE difficulty breathing (e.g., speaks in phrases, SOB even at rest, pulse 100-120) AND [2] NEW-onset or WORSE than normal  Answer Assessment - Initial Assessment Questions Patient's wife called while patient is at dialysis. Patient reported at dialysis that he has been having shortness of breath for the last few weeks which isn't normal for him per wife. Patient has been more fatigued walking from room to room. Has been having pain to right shoulder with breathing for the last three weeks. Wife states she hadn't been aware of any of this symptoms. Patient has been taking more shallow breaths due to pain. Patient was placed on oxygen  at dialysis today due to symptoms. Reports feeling better on dialysis. Recommended to Emergency Department today-wife states she is going to make him go as she had already  talked to him about going. Verbalized understanding.   1. RESPIRATORY STATUS: Describe your breathing? (e.g., wheezing, shortness of breath, unable to speak, severe coughing)      Shortness of breath 2. ONSET: When did this breathing problem begin?      Started the last couple of weeks.  3. PATTERN Does the difficult breathing come and go, or has it been constant since it started?      With activity 4. SEVERITY: How bad is your breathing? (e.g., mild, moderate, severe)      moderate 5. RECURRENT SYMPTOM: Have you had difficulty breathing before? If Yes, ask: When was the last time? and What happened that time?      Last time was in April before he had cardiac stents placed 6. CARDIAC HISTORY: Do you have any history of heart disease? (e.g., heart attack, angina, bypass surgery, angioplasty)      Heart attack, HTN, high cholesterol 7. LUNG HISTORY: Do you have any history of lung disease?  (e.g., pulmonary embolus, asthma, emphysema)     OSA, COPD 8. CAUSE: What do you think is causing the breathing problem?      Not sure what is causing 9. OTHER SYMPTOMS: Do you have any other symptoms? (e.g., chest pain, cough, dizziness, fever, runny nose)     Runny nose 10. O2 SATURATION MONITOR:  Do you use an oxygen  saturation monitor (pulse oximeter) at home? If Yes, ask: What is your reading (oxygen  level) today? What is your usual oxygen  saturation reading? (e.g., 95%)       91%-currently in dialysis was placed on  oxygen  12. TRAVEL: Have you traveled out of the country in the last month? (e.g., travel history, exposures)       no  Protocols used: Breathing Difficulty-A-AH

## 2023-10-06 ENCOUNTER — Emergency Department (HOSPITAL_COMMUNITY)

## 2023-10-06 ENCOUNTER — Other Ambulatory Visit (HOSPITAL_COMMUNITY)

## 2023-10-06 ENCOUNTER — Inpatient Hospital Stay (HOSPITAL_COMMUNITY)
Admission: EM | Admit: 2023-10-06 | Discharge: 2023-11-11 | DRG: 207 | Disposition: E | Attending: Internal Medicine | Admitting: Internal Medicine

## 2023-10-06 ENCOUNTER — Observation Stay (HOSPITAL_COMMUNITY)

## 2023-10-06 ENCOUNTER — Ambulatory Visit: Admitting: Physician Assistant

## 2023-10-06 ENCOUNTER — Other Ambulatory Visit: Payer: Self-pay

## 2023-10-06 ENCOUNTER — Encounter (HOSPITAL_COMMUNITY): Payer: Self-pay

## 2023-10-06 DIAGNOSIS — I9589 Other hypotension: Secondary | ICD-10-CM | POA: Diagnosis present

## 2023-10-06 DIAGNOSIS — Z823 Family history of stroke: Secondary | ICD-10-CM

## 2023-10-06 DIAGNOSIS — E1142 Type 2 diabetes mellitus with diabetic polyneuropathy: Secondary | ICD-10-CM | POA: Diagnosis present

## 2023-10-06 DIAGNOSIS — I499 Cardiac arrhythmia, unspecified: Secondary | ICD-10-CM | POA: Diagnosis not present

## 2023-10-06 DIAGNOSIS — I480 Paroxysmal atrial fibrillation: Secondary | ICD-10-CM | POA: Diagnosis not present

## 2023-10-06 DIAGNOSIS — I252 Old myocardial infarction: Secondary | ICD-10-CM

## 2023-10-06 DIAGNOSIS — Z992 Dependence on renal dialysis: Secondary | ICD-10-CM | POA: Diagnosis not present

## 2023-10-06 DIAGNOSIS — I132 Hypertensive heart and chronic kidney disease with heart failure and with stage 5 chronic kidney disease, or end stage renal disease: Secondary | ICD-10-CM | POA: Diagnosis present

## 2023-10-06 DIAGNOSIS — Z794 Long term (current) use of insulin: Secondary | ICD-10-CM

## 2023-10-06 DIAGNOSIS — I739 Peripheral vascular disease, unspecified: Secondary | ICD-10-CM | POA: Diagnosis not present

## 2023-10-06 DIAGNOSIS — Z885 Allergy status to narcotic agent status: Secondary | ICD-10-CM

## 2023-10-06 DIAGNOSIS — J9811 Atelectasis: Secondary | ICD-10-CM | POA: Diagnosis present

## 2023-10-06 DIAGNOSIS — I440 Atrioventricular block, first degree: Secondary | ICD-10-CM | POA: Diagnosis not present

## 2023-10-06 DIAGNOSIS — E1122 Type 2 diabetes mellitus with diabetic chronic kidney disease: Secondary | ICD-10-CM | POA: Diagnosis present

## 2023-10-06 DIAGNOSIS — N186 End stage renal disease: Secondary | ICD-10-CM | POA: Diagnosis not present

## 2023-10-06 DIAGNOSIS — Z66 Do not resuscitate: Secondary | ICD-10-CM | POA: Diagnosis not present

## 2023-10-06 DIAGNOSIS — Z7901 Long term (current) use of anticoagulants: Secondary | ICD-10-CM

## 2023-10-06 DIAGNOSIS — I959 Hypotension, unspecified: Secondary | ICD-10-CM | POA: Diagnosis not present

## 2023-10-06 DIAGNOSIS — Z955 Presence of coronary angioplasty implant and graft: Secondary | ICD-10-CM

## 2023-10-06 DIAGNOSIS — Z515 Encounter for palliative care: Secondary | ICD-10-CM

## 2023-10-06 DIAGNOSIS — I5023 Acute on chronic systolic (congestive) heart failure: Secondary | ICD-10-CM | POA: Diagnosis present

## 2023-10-06 DIAGNOSIS — Z1152 Encounter for screening for COVID-19: Secondary | ICD-10-CM

## 2023-10-06 DIAGNOSIS — E785 Hyperlipidemia, unspecified: Secondary | ICD-10-CM

## 2023-10-06 DIAGNOSIS — R140 Abdominal distension (gaseous): Secondary | ICD-10-CM | POA: Diagnosis not present

## 2023-10-06 DIAGNOSIS — R0603 Acute respiratory distress: Secondary | ICD-10-CM | POA: Diagnosis present

## 2023-10-06 DIAGNOSIS — E8809 Other disorders of plasma-protein metabolism, not elsewhere classified: Secondary | ICD-10-CM | POA: Diagnosis not present

## 2023-10-06 DIAGNOSIS — Z833 Family history of diabetes mellitus: Secondary | ICD-10-CM

## 2023-10-06 DIAGNOSIS — E7849 Other hyperlipidemia: Secondary | ICD-10-CM | POA: Diagnosis present

## 2023-10-06 DIAGNOSIS — Z79899 Other long term (current) drug therapy: Secondary | ICD-10-CM

## 2023-10-06 DIAGNOSIS — R579 Shock, unspecified: Secondary | ICD-10-CM

## 2023-10-06 DIAGNOSIS — D65 Disseminated intravascular coagulation [defibrination syndrome]: Secondary | ICD-10-CM | POA: Diagnosis not present

## 2023-10-06 DIAGNOSIS — M8458XA Pathological fracture in neoplastic disease, other specified site, initial encounter for fracture: Secondary | ICD-10-CM | POA: Diagnosis present

## 2023-10-06 DIAGNOSIS — Z87891 Personal history of nicotine dependence: Secondary | ICD-10-CM

## 2023-10-06 DIAGNOSIS — I428 Other cardiomyopathies: Secondary | ICD-10-CM | POA: Diagnosis present

## 2023-10-06 DIAGNOSIS — J151 Pneumonia due to Pseudomonas: Secondary | ICD-10-CM | POA: Diagnosis not present

## 2023-10-06 DIAGNOSIS — Z808 Family history of malignant neoplasm of other organs or systems: Secondary | ICD-10-CM

## 2023-10-06 DIAGNOSIS — J690 Pneumonitis due to inhalation of food and vomit: Secondary | ICD-10-CM | POA: Diagnosis not present

## 2023-10-06 DIAGNOSIS — Z6823 Body mass index (BMI) 23.0-23.9, adult: Secondary | ICD-10-CM

## 2023-10-06 DIAGNOSIS — E1169 Type 2 diabetes mellitus with other specified complication: Secondary | ICD-10-CM | POA: Diagnosis present

## 2023-10-06 DIAGNOSIS — I5043 Acute on chronic combined systolic (congestive) and diastolic (congestive) heart failure: Secondary | ICD-10-CM | POA: Diagnosis not present

## 2023-10-06 DIAGNOSIS — R0902 Hypoxemia: Secondary | ICD-10-CM | POA: Diagnosis present

## 2023-10-06 DIAGNOSIS — I447 Left bundle-branch block, unspecified: Secondary | ICD-10-CM | POA: Diagnosis present

## 2023-10-06 DIAGNOSIS — R079 Chest pain, unspecified: Secondary | ICD-10-CM | POA: Diagnosis not present

## 2023-10-06 DIAGNOSIS — N2581 Secondary hyperparathyroidism of renal origin: Secondary | ICD-10-CM | POA: Diagnosis present

## 2023-10-06 DIAGNOSIS — I469 Cardiac arrest, cause unspecified: Secondary | ICD-10-CM

## 2023-10-06 DIAGNOSIS — Y95 Nosocomial condition: Secondary | ICD-10-CM | POA: Diagnosis not present

## 2023-10-06 DIAGNOSIS — I953 Hypotension of hemodialysis: Secondary | ICD-10-CM | POA: Diagnosis present

## 2023-10-06 DIAGNOSIS — R911 Solitary pulmonary nodule: Secondary | ICD-10-CM | POA: Diagnosis present

## 2023-10-06 DIAGNOSIS — Z89512 Acquired absence of left leg below knee: Secondary | ICD-10-CM

## 2023-10-06 DIAGNOSIS — C7951 Secondary malignant neoplasm of bone: Secondary | ICD-10-CM | POA: Diagnosis present

## 2023-10-06 DIAGNOSIS — J440 Chronic obstructive pulmonary disease with acute lower respiratory infection: Secondary | ICD-10-CM | POA: Diagnosis not present

## 2023-10-06 DIAGNOSIS — E871 Hypo-osmolality and hyponatremia: Secondary | ICD-10-CM | POA: Diagnosis not present

## 2023-10-06 DIAGNOSIS — J90 Pleural effusion, not elsewhere classified: Secondary | ICD-10-CM | POA: Diagnosis not present

## 2023-10-06 DIAGNOSIS — Z9911 Dependence on respirator [ventilator] status: Secondary | ICD-10-CM

## 2023-10-06 DIAGNOSIS — I4901 Ventricular fibrillation: Secondary | ICD-10-CM | POA: Diagnosis not present

## 2023-10-06 DIAGNOSIS — R54 Age-related physical debility: Secondary | ICD-10-CM | POA: Diagnosis present

## 2023-10-06 DIAGNOSIS — Z8042 Family history of malignant neoplasm of prostate: Secondary | ICD-10-CM

## 2023-10-06 DIAGNOSIS — J9601 Acute respiratory failure with hypoxia: Secondary | ICD-10-CM | POA: Diagnosis not present

## 2023-10-06 DIAGNOSIS — R6521 Severe sepsis with septic shock: Secondary | ICD-10-CM | POA: Diagnosis not present

## 2023-10-06 DIAGNOSIS — E43 Unspecified severe protein-calorie malnutrition: Secondary | ICD-10-CM | POA: Diagnosis not present

## 2023-10-06 DIAGNOSIS — L8990 Pressure ulcer of unspecified site, unspecified stage: Secondary | ICD-10-CM | POA: Insufficient documentation

## 2023-10-06 DIAGNOSIS — Z87442 Personal history of urinary calculi: Secondary | ICD-10-CM

## 2023-10-06 DIAGNOSIS — C9000 Multiple myeloma not having achieved remission: Secondary | ICD-10-CM | POA: Diagnosis present

## 2023-10-06 DIAGNOSIS — I2510 Atherosclerotic heart disease of native coronary artery without angina pectoris: Secondary | ICD-10-CM | POA: Diagnosis present

## 2023-10-06 DIAGNOSIS — I2489 Other forms of acute ischemic heart disease: Secondary | ICD-10-CM | POA: Diagnosis not present

## 2023-10-06 DIAGNOSIS — Z96651 Presence of right artificial knee joint: Secondary | ICD-10-CM | POA: Diagnosis present

## 2023-10-06 DIAGNOSIS — Z89511 Acquired absence of right leg below knee: Secondary | ICD-10-CM

## 2023-10-06 DIAGNOSIS — A4152 Sepsis due to Pseudomonas: Secondary | ICD-10-CM | POA: Diagnosis not present

## 2023-10-06 DIAGNOSIS — M25519 Pain in unspecified shoulder: Secondary | ICD-10-CM | POA: Diagnosis not present

## 2023-10-06 DIAGNOSIS — I468 Cardiac arrest due to other underlying condition: Secondary | ICD-10-CM | POA: Diagnosis not present

## 2023-10-06 DIAGNOSIS — R918 Other nonspecific abnormal finding of lung field: Secondary | ICD-10-CM | POA: Diagnosis not present

## 2023-10-06 DIAGNOSIS — Z950 Presence of cardiac pacemaker: Secondary | ICD-10-CM | POA: Diagnosis not present

## 2023-10-06 DIAGNOSIS — J9819 Other pulmonary collapse: Secondary | ICD-10-CM | POA: Diagnosis present

## 2023-10-06 DIAGNOSIS — E1165 Type 2 diabetes mellitus with hyperglycemia: Secondary | ICD-10-CM | POA: Diagnosis not present

## 2023-10-06 DIAGNOSIS — R570 Cardiogenic shock: Secondary | ICD-10-CM | POA: Diagnosis not present

## 2023-10-06 DIAGNOSIS — G9341 Metabolic encephalopathy: Secondary | ICD-10-CM | POA: Diagnosis not present

## 2023-10-06 DIAGNOSIS — Z811 Family history of alcohol abuse and dependence: Secondary | ICD-10-CM

## 2023-10-06 DIAGNOSIS — E1151 Type 2 diabetes mellitus with diabetic peripheral angiopathy without gangrene: Secondary | ICD-10-CM | POA: Diagnosis present

## 2023-10-06 DIAGNOSIS — I472 Ventricular tachycardia, unspecified: Secondary | ICD-10-CM | POA: Diagnosis not present

## 2023-10-06 DIAGNOSIS — Z888 Allergy status to other drugs, medicaments and biological substances status: Secondary | ICD-10-CM

## 2023-10-06 DIAGNOSIS — Z9981 Dependence on supplemental oxygen: Secondary | ICD-10-CM

## 2023-10-06 DIAGNOSIS — Z95810 Presence of automatic (implantable) cardiac defibrillator: Secondary | ICD-10-CM

## 2023-10-06 DIAGNOSIS — Z8249 Family history of ischemic heart disease and other diseases of the circulatory system: Secondary | ICD-10-CM

## 2023-10-06 DIAGNOSIS — R0602 Shortness of breath: Secondary | ICD-10-CM | POA: Diagnosis not present

## 2023-10-06 DIAGNOSIS — Z7902 Long term (current) use of antithrombotics/antiplatelets: Secondary | ICD-10-CM

## 2023-10-06 DIAGNOSIS — D631 Anemia in chronic kidney disease: Secondary | ICD-10-CM | POA: Diagnosis present

## 2023-10-06 DIAGNOSIS — Z8052 Family history of malignant neoplasm of bladder: Secondary | ICD-10-CM

## 2023-10-06 LAB — BRAIN NATRIURETIC PEPTIDE: B Natriuretic Peptide: 526.2 pg/mL — ABNORMAL HIGH (ref 0.0–100.0)

## 2023-10-06 LAB — CBC WITH DIFFERENTIAL/PLATELET
Abs Immature Granulocytes: 0.03 K/uL (ref 0.00–0.07)
Basophils Absolute: 0.1 K/uL (ref 0.0–0.1)
Basophils Relative: 1 %
Eosinophils Absolute: 0.3 K/uL (ref 0.0–0.5)
Eosinophils Relative: 3 %
HCT: 42.3 % (ref 39.0–52.0)
Hemoglobin: 13.6 g/dL (ref 13.0–17.0)
Immature Granulocytes: 0 %
Lymphocytes Relative: 10 %
Lymphs Abs: 1 K/uL (ref 0.7–4.0)
MCH: 28.4 pg (ref 26.0–34.0)
MCHC: 32.2 g/dL (ref 30.0–36.0)
MCV: 88.3 fL (ref 80.0–100.0)
Monocytes Absolute: 1.5 K/uL — ABNORMAL HIGH (ref 0.1–1.0)
Monocytes Relative: 15 %
Neutro Abs: 7 K/uL (ref 1.7–7.7)
Neutrophils Relative %: 71 %
Platelets: 240 K/uL (ref 150–400)
RBC: 4.79 MIL/uL (ref 4.22–5.81)
RDW: 14.8 % (ref 11.5–15.5)
WBC: 9.9 K/uL (ref 4.0–10.5)
nRBC: 0 % (ref 0.0–0.2)

## 2023-10-06 LAB — COMPREHENSIVE METABOLIC PANEL WITH GFR
ALT: 17 U/L (ref 0–44)
AST: 24 U/L (ref 15–41)
Albumin: 2.7 g/dL — ABNORMAL LOW (ref 3.5–5.0)
Alkaline Phosphatase: 101 U/L (ref 38–126)
Anion gap: 17 — ABNORMAL HIGH (ref 5–15)
BUN: 30 mg/dL — ABNORMAL HIGH (ref 8–23)
CO2: 25 mmol/L (ref 22–32)
Calcium: 9.5 mg/dL (ref 8.9–10.3)
Chloride: 91 mmol/L — ABNORMAL LOW (ref 98–111)
Creatinine, Ser: 8.08 mg/dL — ABNORMAL HIGH (ref 0.61–1.24)
GFR, Estimated: 7 mL/min — ABNORMAL LOW (ref >60)
Glucose, Bld: 237 mg/dL — ABNORMAL HIGH (ref 70–99)
Potassium: 3.4 mmol/L — ABNORMAL LOW (ref 3.5–5.1)
Sodium: 133 mmol/L — ABNORMAL LOW (ref 135–145)
Total Bilirubin: 0.5 mg/dL (ref 0.0–1.2)
Total Protein: 7.1 g/dL (ref 6.5–8.1)

## 2023-10-06 LAB — GLUCOSE, CAPILLARY
Glucose-Capillary: 196 mg/dL — ABNORMAL HIGH (ref 70–99)
Glucose-Capillary: 179 mg/dL — ABNORMAL HIGH (ref 70–99)

## 2023-10-06 LAB — TROPONIN I (HIGH SENSITIVITY)
Troponin I (High Sensitivity): 149 ng/L (ref <18)
Troponin I (High Sensitivity): 155 ng/L (ref <18)

## 2023-10-06 LAB — RESP PANEL BY RT-PCR (RSV, FLU A&B, COVID)  RVPGX2
Influenza A by PCR: NEGATIVE
Influenza B by PCR: NEGATIVE
Resp Syncytial Virus by PCR: NEGATIVE
SARS Coronavirus 2 by RT PCR: NEGATIVE

## 2023-10-06 LAB — HIV ANTIBODY (ROUTINE TESTING W REFLEX): HIV Screen 4th Generation wRfx: NONREACTIVE

## 2023-10-06 LAB — HEPATITIS B SURFACE ANTIGEN: Hepatitis B Surface Ag: NONREACTIVE

## 2023-10-06 LAB — LIPASE, BLOOD: Lipase: 25 U/L (ref 11–51)

## 2023-10-06 MED ORDER — CARVEDILOL 6.25 MG PO TABS
6.2500 mg | ORAL_TABLET | Freq: Two times a day (BID) | ORAL | Status: DC
  Administered 2023-10-06 – 2023-10-09 (×5): 6.25 mg via ORAL
  Filled 2023-10-06 (×5): qty 1
  Filled 2023-10-06: qty 2
  Filled 2023-10-06: qty 1
  Filled 2023-10-06: qty 2

## 2023-10-06 MED ORDER — HYDROCODONE-ACETAMINOPHEN 5-325 MG PO TABS
1.0000 | ORAL_TABLET | Freq: Once | ORAL | Status: AC
  Administered 2023-10-06: 1 via ORAL
  Filled 2023-10-06: qty 1

## 2023-10-06 MED ORDER — LIDOCAINE HCL (PF) 1 % IJ SOLN: 5.0000 mL | INTRAMUSCULAR | Status: DC | PRN

## 2023-10-06 MED ORDER — AMIODARONE HCL 200 MG PO TABS
200.0000 mg | ORAL_TABLET | Freq: Every day | ORAL | Status: DC
Start: 2023-10-07 — End: 2023-10-11
  Administered 2023-10-08 – 2023-10-10 (×3): 200 mg via ORAL
  Filled 2023-10-06 (×5): qty 1

## 2023-10-06 MED ORDER — CLOPIDOGREL BISULFATE 75 MG PO TABS
75.0000 mg | ORAL_TABLET | Freq: Every day | ORAL | Status: DC
  Administered 2023-10-07 – 2023-10-10 (×4): 75 mg via ORAL
  Filled 2023-10-06 (×5): qty 1

## 2023-10-06 MED ORDER — SODIUM CHLORIDE 0.9% FLUSH
3.0000 mL | Freq: Two times a day (BID) | INTRAVENOUS | Status: DC
  Administered 2023-10-06 – 2023-10-19 (×19): 3 mL via INTRAVENOUS

## 2023-10-06 MED ORDER — AMIODARONE HCL 200 MG PO TABS: 200.0000 mg | ORAL_TABLET | Freq: Two times a day (BID) | ORAL | Status: DC

## 2023-10-06 MED ORDER — INSULIN GLARGINE 100 UNIT/ML ~~LOC~~ SOLN
16.0000 [IU] | Freq: Every day | SUBCUTANEOUS | Status: DC
  Administered 2023-10-08 – 2023-10-11 (×4): 16 [IU] via SUBCUTANEOUS
  Filled 2023-10-06 (×6): qty 0.16

## 2023-10-06 MED ORDER — RENA-VITE PO TABS
1.0000 | ORAL_TABLET | Freq: Every day | ORAL | Status: DC
  Administered 2023-10-08 – 2023-10-10 (×3): 1 via ORAL
  Filled 2023-10-06 (×5): qty 1

## 2023-10-06 MED ORDER — CHLORHEXIDINE GLUCONATE CLOTH 2 % EX PADS
6.0000 | MEDICATED_PAD | Freq: Every day | CUTANEOUS | Status: DC
  Administered 2023-10-07 – 2023-10-19 (×13): 6 via TOPICAL

## 2023-10-06 MED ORDER — LIDOCAINE HCL (PF) 1 % IJ SOLN
10.0000 mL | Freq: Once | INTRAMUSCULAR | Status: AC
Start: 2023-10-06 — End: 2023-10-06
  Administered 2023-10-06: 10 mL

## 2023-10-06 MED ORDER — APIXABAN 5 MG PO TABS
5.0000 mg | ORAL_TABLET | Freq: Two times a day (BID) | ORAL | Status: DC
Start: 2023-10-06 — End: 2023-10-09
  Administered 2023-10-06 – 2023-10-09 (×6): 5 mg via ORAL
  Filled 2023-10-06 (×6): qty 1

## 2023-10-06 MED ORDER — ACETAMINOPHEN 325 MG PO TABS
650.0000 mg | ORAL_TABLET | Freq: Four times a day (QID) | ORAL | Status: DC | PRN
  Administered 2023-10-06 – 2023-10-19 (×3): 650 mg via ORAL
  Filled 2023-10-06 (×3): qty 2

## 2023-10-06 MED ORDER — MIDODRINE HCL 5 MG PO TABS
10.0000 mg | ORAL_TABLET | ORAL | Status: DC
Start: 2023-10-07 — End: 2023-10-11
  Administered 2023-10-07 – 2023-10-10 (×2): 10 mg via ORAL
  Filled 2023-10-06 (×2): qty 2

## 2023-10-06 MED ORDER — INSULIN ASPART 100 UNIT/ML IJ SOLN
0.0000 [IU] | Freq: Three times a day (TID) | INTRAMUSCULAR | Status: DC
  Administered 2023-10-06 – 2023-10-07 (×2): 2 [IU] via SUBCUTANEOUS
  Administered 2023-10-07: 3 [IU] via SUBCUTANEOUS
  Administered 2023-10-08: 2 [IU] via SUBCUTANEOUS
  Administered 2023-10-08: 5 [IU] via SUBCUTANEOUS
  Administered 2023-10-08 – 2023-10-09 (×2): 2 [IU] via SUBCUTANEOUS
  Administered 2023-10-09: 3 [IU] via SUBCUTANEOUS
  Administered 2023-10-10 – 2023-10-11 (×3): 2 [IU] via SUBCUTANEOUS

## 2023-10-06 MED ORDER — ALBUTEROL SULFATE (2.5 MG/3ML) 0.083% IN NEBU: 2.5000 mg | INHALATION_SOLUTION | Freq: Four times a day (QID) | RESPIRATORY_TRACT | Status: DC | PRN

## 2023-10-06 MED ORDER — ACETAMINOPHEN 650 MG RE SUPP: 650.0000 mg | Freq: Four times a day (QID) | RECTAL | Status: DC | PRN

## 2023-10-06 MED ORDER — ATORVASTATIN CALCIUM 80 MG PO TABS
80.0000 mg | ORAL_TABLET | Freq: Every day | ORAL | Status: DC
Start: 2023-10-06 — End: 2023-10-11
  Administered 2023-10-06 – 2023-10-10 (×5): 80 mg via ORAL
  Filled 2023-10-06 (×5): qty 1

## 2023-10-06 MED ORDER — MIDODRINE HCL 5 MG PO TABS
10.0000 mg | ORAL_TABLET | Freq: Three times a day (TID) | ORAL | Status: DC
Start: 2023-10-06 — End: 2023-10-06

## 2023-10-06 NOTE — Procedures (Signed)
 PROCEDURE SUMMARY:  Successful image-guided diagnostic and therapeutic thoracentesis from the right chest.  Yielded 1.85 liters of amber fluid.  No immediate complications.  EBL: < 2 mL Patient tolerated well.   Specimen not sent for labs per order for therapeutic only.  Post-procedure CXR ordered.  Please see imaging section of Epic for full dictation.  Zahmir Lalla NP 10/06/2023 4:44 PM

## 2023-10-06 NOTE — ED Triage Notes (Signed)
 Pt bib EMS for cp and SOB x1 month. Had x-rays yesterday and was told this morning he has pleural effusion on the right side.

## 2023-10-06 NOTE — ED Notes (Signed)
 CCMD called.

## 2023-10-06 NOTE — ED Provider Notes (Signed)
 [place] EMERGENCY DEPARTMENT AT [facility] Provider Note   CSN: [id number] Arrival date & time: 10/06/23  1335     Patient presents with: Shortness of Breath  HPI Anthony Bullock is a 63 y.o. male with CHF, CAD, ESRD, indwelling ICD not currently activated, type 2 diabetes, COPD, HTN presenting for shortness of breath.  He states has been going on for about a month.  Started gradually and is worse with exertion.  Also reports he has had some pain in the right part of his chest that radiates to the back at times.  This has been going on for 4 to 6 weeks.  Reports that he is taking his Eliquis  and Plavix  as prescribed him.  Denies cough and fever.  Did undergo dialysis at his appointment yesterday.  X-ray was done there and there was concern for right sided pleural effusion.  Was advised to come here for further evaluation.    Shortness of Breath      Prior to Admission medications   Medication Sig Start Date End Date Taking? Authorizing Provider  acetaminophen  (TYLENOL ) 325 MG tablet Take 1-2 tablets (325-650 mg total) by mouth every 4 (four) hours as needed for mild pain. 11/29/19   Love, Sharlet RAMAN, PA-C  amiodarone  (PACERONE ) 200 MG tablet TAKE 1 TABLET ( 200 MG ) BY MOUTH TWICE DAILY FOR 20 DAYS, THEN 1 TABLET ( 200 MG TOTAL ) ONCE DAILY 09/18/23   Burchette, Wolm LELON, MD  apixaban  (ELIQUIS ) 5 MG TABS tablet Take 1 tablet (5 mg total) by mouth 2 (two) times daily. 05/22/23   Ghimire, Donalda HERO, MD  atorvastatin  (LIPITOR ) 80 MG tablet Take 1 tablet (80 mg total) by mouth at bedtime. 09/20/23   Burchette, Wolm LELON, MD  carvedilol  (COREG ) 6.25 MG tablet Take 1 tablet (6.25 mg total) by mouth 2 (two) times daily. 09/20/23   Burchette, Wolm LELON, MD  cinacalcet  (SENSIPAR ) 30 MG tablet Take 1 tablet (30 mg total) by mouth Every Tuesday,Thursday,and Saturday with dialysis. 11/30/19   Love, Sharlet RAMAN, PA-C  clopidogrel  (PLAVIX ) 75 MG tablet Take 1 tablet (75 mg total) by mouth daily with  breakfast. 08/14/23   Burchette, Wolm LELON, MD  Continuous Glucose Receiver (FREESTYLE LIBRE 3 READER) DEVI Use to check blood glucose TID 08/11/22   Burchette, Wolm LELON, MD  Continuous Glucose Sensor (FREESTYLE LIBRE 3 PLUS SENSOR) MISC USE AS DIRECTED TO CHECK GLUCOSE DAILY. CHANGE EVERY 15 DAYS 05/24/23   Burchette, Wolm LELON, MD  Elastic Bandages & Supports (ABDOMINAL BINDER/ELASTIC MED) MISC 1 kit by Does not apply route daily. 06/09/23   Miriam Norris, NP  gabapentin  (NEURONTIN ) 300 MG capsule Take 1 capsule (300 mg total) by mouth at bedtime. 04/15/23   Hongalgi, Anand D, MD  glucose blood test strip Check 1 time daily. E11.9 One Touch Ultra Blue Test Strips 03/10/14   Burchette, Wolm LELON, MD  HUMALOG  KARY 200 UNIT/ML KwikPen INJECT A MAXIMUM OF 28 UNITS SUBCUTANEOUSLY TWICE DAILY WITH LUNCH AND SUPPER PER SLIDING SCALE. APPOINTMENT REQUIRED FOR FUTURE REFILLS 08/16/23   Burchette, Wolm LELON, MD  Insulin  Pen Needle (BD PEN NEEDLE NANO U/F) 32G X 4 MM MISC USE 1 PEN NEEDLE SUBCUTANEOUSLY WITH INSULIN  4 TIMES DAILY 12/04/19   Burchette, Wolm LELON, MD  midodrine  (PROAMATINE ) 10 MG tablet Take 1 tablet (10 mg total) by mouth every 8 (eight) hours. 09/20/23   Burchette, Wolm LELON, MD  multivitamin (RENA-VIT) TABS tablet Take 1 tablet by mouth once daily  07/06/21   Burchette, Wolm ORN, MD  nitroGLYCERIN  (NITROSTAT ) 0.4 MG SL tablet Place 1 tablet (0.4 mg total) under the tongue every 5 (five) minutes as needed for chest pain. 04/21/23   Burchette, Wolm ORN, MD  Olopatadine  HCl 0.2 % SOLN Place 1 drop into both eyes daily as needed (for allergies).     [provider]  VENTOLIN  HFA 108 (90 Base) MCG/ACT inhaler INHALE 1 TO 2 PUFFS BY MOUTH EVERY 6 HOURS AS NEEDED FOR WHEEZING FOR SHORTNESS OF BREATH 05/05/23   Burchette, Wolm ORN, MD    Allergies: Epoetin  alfa, Ferumoxytol , and Morphine sulfate    Review of Systems  Respiratory:  Positive for shortness of breath.     Updated Vital Signs BP (!) 140/45 (BP  Location: Right Arm)   Pulse 76   Temp 98.6 F (37 C) (Oral)   Resp (!) 22   Ht 5' 10 (1.778 m)   Wt 82.1 kg   SpO2 96%   BMI 25.97 kg/m   Physical Exam Vitals and nursing note reviewed.  HENT:     Head: Normocephalic and atraumatic.     Mouth/Throat:     Mouth: Mucous membranes are moist.  Eyes:     General:        Right eye: No discharge.        Left eye: No discharge.     Conjunctiva/sclera: Conjunctivae normal.  Cardiovascular:     Rate and Rhythm: Normal rate and regular rhythm.     Pulses: Normal pulses.     Heart sounds: Normal heart sounds.  Pulmonary:     Effort: Pulmonary effort is normal.     Breath sounds: Examination of the right-middle field reveals decreased breath sounds. Examination of the right-lower field reveals decreased breath sounds. Decreased breath sounds present. No wheezing, rhonchi or rales.  Abdominal:     General: Abdomen is flat. There is no distension.     Palpations: Abdomen is soft.     Tenderness: There is abdominal tenderness in the right upper quadrant.  Skin:    General: Skin is warm and dry.  Neurological:     General: No focal deficit present.  Psychiatric:        Mood and Affect: Mood normal.     (all labs ordered are listed, but only abnormal results are displayed) Labs Reviewed  CBC WITH DIFFERENTIAL/PLATELET - Abnormal; Notable for the following components:      Result Value   Monocytes Absolute 1.5 (*)    All other components within normal limits  COMPREHENSIVE METABOLIC PANEL WITH GFR - Abnormal; Notable for the following components:   Sodium 133 (*)    Potassium 3.4 (*)    Chloride 91 (*)    Glucose, Bld 237 (*)    BUN 30 (*)    Creatinine, Ser 8.08 (*)    Albumin  2.7 (*)    GFR, Estimated 7 (*)    Anion gap 17 (*)    All other components within normal limits  RESP PANEL BY RT-PCR (RSV, FLU A&B, COVID)  RVPGX2  BRAIN NATRIURETIC PEPTIDE  LIPASE, BLOOD  TROPONIN I (HIGH SENSITIVITY)  TROPONIN I (HIGH  SENSITIVITY)    EKG: EKG Interpretation Date/Time:  Friday October 06 2023 13:51:19 EDT Ventricular Rate:  67 PR Interval:  200 QRS Duration:  145 QT Interval:  466 QTC Calculation: 492 R Axis:   -46  Text Interpretation: Sinus rhythm Left bundle branch block Confirmed by Anthony Barter [phone]) on 10/06/2023  3:41:30 PM  Radiology: US  Abdomen Limited RUQ (LIVER/GB) Result Date: 10/06/2023 CLINICAL DATA:  151470 RUQ abdominal pain [id number] EXAM: ULTRASOUND ABDOMEN LIMITED RIGHT UPPER QUADRANT COMPARISON:  09/28/2009 FINDINGS: Gallbladder: Single small gallstone. Moderate circumferential wall thickening of the gallbladder. No pericholecystic fluid. No sonographic Murphy's sign noted by sonographer. Common bile duct: Diameter: 4 mm Liver: Normal echogenicity. No focal lesion identified. No intrahepatic biliary ductal dilation. Portal vein is patent on color Doppler imaging with normal direction of blood flow towards the liver. Right Kidney: Possible posterior shadowing overlying the partially visualized renal parenchyma, possibly nonobstructive calculi. Other: Moderate right pleural effusion. IMPRESSION: 1. Mild gallbladder wall thickening, which in the absence of pericholecystic fluid and a sonographic Murphy's sign, may be due to reactive to biliary stasis, upper abdominal inflammation, acute hepatitis, or volume overload from either CHF or renal failure. Laboratory correlation recommended. 2. Moderate volume right pleural effusion. 3. Scattered posterior shadowing overlying the partially visualized right kidney, possibly representing nonobstructive renal calculi. Electronically Signed   By: Rogelia Myers M.D.   On: 10/06/2023 15:41   DG Chest Port 1 View Result Date: 10/06/2023 CLINICAL DATA:  Shortness of breath EXAM: PORTABLE CHEST 1 VIEW COMPARISON:  10/05/2023 FINDINGS: Right central venous catheter with tip over the cavoatrial junction region. Cardiac pacemaker. Shallow inspiration. Cardiac  enlargement. Large right pleural effusion with basilar atelectasis and/or consolidation. This is mildly progressed since prior study, possibly due to differences in patient positioning. The left lung is clear. Visualized mediastinal contours appear intact. Degenerative changes in the spine. IMPRESSION: Large right pleural effusion with basilar atelectasis and/or consolidation demonstrating mild progression. Electronically Signed   By: Elsie Gravely M.D.   On: 10/06/2023 15:08   DG Chest 2 View Result Date: 10/05/2023 CLINICAL DATA:  Shortness of breath. EXAM: CHEST - 2 VIEW COMPARISON:  05/21/2023 FINDINGS: Right IJ dialysis catheter unchanged. Left-sided pacer unchanged. Lungs are adequately inflated as the left lung is clear. Interval worsening of a moderate to large right pleural effusion likely with associated compressive atelectasis. Effusion occupies approximately 3/4 of the right hemithorax. Cardiomediastinal silhouette and remainder of the exam is unchanged. IMPRESSION: Interval worsening of a moderate to large right pleural effusion likely with associated compressive atelectasis. Electronically Signed   By: Toribio Agreste M.D.   On: 10/05/2023 11:43     Procedures   Medications Ordered in the ED  HYDROcodone -acetaminophen  (NORCO/VICODIN) 5-325 MG per tablet 1 tablet (1 tablet Oral Given 10/06/23 1422)    Clinical Course as of 10/06/23 1600  Fri Oct 06, 2023  1525 DG Chest [place] 1 View [JR]    Clinical Course User Index [JR] Anthony Norleen POUR, PA-C                                 Medical Decision Making Amount and/or Complexity of Data Reviewed Labs: ordered. Radiology: ordered. Decision-making details documented in ED Course.  Risk Prescription drug management.   Initial Impression and Ddx 63 year old well-appearing male presenting for shortness of breath.  Exam notable for diminished breath sounds on the right.  DDx includes volume overload, pleural effusion, CHF  exacerbation, PE, COPD exacerbation, pneumothorax, other. Patient PMH that increases complexity of ED encounter:  CHF, CAD, ESRD, indwelling ICD not currently activated, type 2 diabetes, COPD, HTN  Interpretation of Diagnostics - I independent reviewed and interpreted the labs as followed: hyperglycemia  - I independently visualized the following imaging with scope of  interpretation limited to determining acute life threatening conditions related to emergency care: CXR, which revealed large right pleural effusion.  - I personally reviewed and interpreted EKG which revealed sinus rhythm  Patient Reassessment and Ultimate Disposition/Management Workup revealing large right sided pleural effusion.  Was initially tachypneic but patient now is stable on 4 L of oxygen .  Admitted to hospital service with Dr. Maximino Bullock.  Patient management required discussion with the following services or consulting groups:  Hospitalist Service  Complexity of Problems Addressed Acute complicated illness or Injury  Additional Data Reviewed and Analyzed Further history obtained from: Past medical history and medications listed in the EMR and Prior ED visit notes  Patient Encounter Risk Assessment Consideration of hospitalization      Final diagnoses:  Pleural effusion    ED Discharge Orders     None          Anthony Norleen POUR, PA-C 10/06/23 1600    Anthony Prentice SAUNDERS, MD 10/06/23 [phone]

## 2023-10-06 NOTE — H&P (Signed)
 History and Physical    Patient: Anthony Bullock FMW:[id number] DOB: 06-18-1960 DOA: 10/06/2023 DOS: the patient was seen and examined on 10/06/2023 PCP: Micheal Wolm LELON, MD  Patient coming from: Home  Chief Complaint:  Chief Complaint  Patient presents with   Shortness of Breath   HPI: Anthony Bullock is a 63 y.o. male with medical history significant of HTN, HLD, chronic systolic CHF, paroxysmal atrial fibrillation, CAD ESRD on HD (TTS), DM type II, COPD, PVD s/p bilateral BKA, chronic anemia, and GI bleed who presents with shortness of breath and right-sided chest pain.  The patient experiences shortness of breath and right-sided chest pain radiating to the right shoulder. They underwent dialysis the previous day, during which x-rays were ordered.  No fever or chills. They are a bilateral below-knee amputee. This is the first occurrence of needing fluid drainage to alleviate their symptoms.  On admission into the emergency department patient was noted to be afebrile with respirations 22, and O2 saturations currently maintained on 4 L of nasal cannula oxygen .  Labs noted sodium 133, potassium 3.4, chloride 91, BUN 30, creatinine 8.08, and glucose 237.  Chest x-ray noted a large right-sided pleural effusion with bibasilar atelectasis and/or consolidation demonstrating mild progression.  Influenza, COVID-19, and RSV screening were negative.  Abdominal ultrasound revealed normal gallbladder wall thickening and absence of pericholecystic and moderate volume pleural effusion.  Patient had been given hydrocodone  for pain.  Review of Systems: As mentioned in the history of present illness. All other systems reviewed and are negative. Past Medical History:  Diagnosis Date   AICD (automatic cardioverter/defibrillator) present    [employer]   Allergic rhinitis    Anemia    Arthritis    Chronic systolic heart failure (HCC)    a. ECHO (12/2012) EF 25-30%, HK entireanteroseptal myocardium //   b.  EF 25%, diffuse HK, grade 1 diastolic dysfunction, MAC, mild LAE, normal RVSF, trivial pericardial effusion   COPD (chronic obstructive pulmonary disease) (HCC)    Diabetes mellitus type II    Diabetic nephropathy (HCC)    Diabetic neuropathy (HCC)    ESRD on hemodialysis (HCC)    started HD June 2017, goes to [facility] HD unit, Dr Edwardo   History of cardiac catheterization    a.Myoview  1/15:  There is significant left ventricular dysfunction. There may be slight scar at the apex. There is no significant ischemia. LV Ejection Fraction: 27%  //  b. RHC/LHC (1/15) with mean RA 6, PA 47/22 mean 33, mean PCWP 20, PVR 2.5 WU, CI 2.5; 80% dLAD stenosis, 70% diffuse large D.     History of kidney stones    Hyperlipidemia    Hypertension    Kidney stones    NICM (nonischemic cardiomyopathy) (HCC)    Primarily nonischemic.  Echo (12/14) with EF 25-30%.  Echo (3/15) with EF 25%, mild to moderately dilated LV, normal RV size and systolic function.     Osteomyelitis (HCC)    left fifth ray   Pneumonia    Urethral stricture    Wears glasses    Past Surgical History:  Procedure Laterality Date   ABDOMINAL AORTOGRAM W/LOWER EXTREMITY N/A 03/30/2016   Procedure: Abdominal Aortogram w/Lower Extremity;  Surgeon: Lonni GORMAN Blade, MD;  Location: [facility] INVASIVE CV LAB;  Service: Cardiovascular;  Laterality: N/A;   AMPUTATION Right 04/26/2016   Procedure: Right Below Knee Amputation;  Surgeon: Jerona Harden GAILS, MD;  Location: [facility] OR;  Service: Orthopedics;  Laterality: Right;  AMPUTATION Left 08/21/2019   Procedure: LEFT FOOT 5TH RAY AMPUTATION;  Surgeon: Harden Jerona GAILS, MD;  Location: [facility] OR;  Service: Orthopedics;  Laterality: Left;   AMPUTATION Left 11/13/2019   Procedure: LEFT BELOW KNEE AMPUTATION;  Surgeon: Harden Jerona GAILS, MD;  Location: [facility] OR;  Service: Orthopedics;  Laterality: Left;   AV FISTULA PLACEMENT Right 09/08/2015   Procedure: INSERTION OF 4-26mm x 45cm  ARTERIOVENOUS (AV) GORE-TEX GRAFT  RIGHT UPPER  ARM;  Surgeon: Lonni GORMAN Blade, MD;  Location: [facility] OR;  Service: Vascular;  Laterality: Right;   AV FISTULA PLACEMENT Left 01/14/2016   Procedure: CREATION OF LEFT UPPER ARM ARTERIOVENOUS FISTULA;  Surgeon: Lonni GORMAN Blade, MD;  Location: [facility] OR;  Service: Vascular;  Laterality: Left;   BASCILIC VEIN TRANSPOSITION Right 08/22/2014   Procedure: RIGHT UPPER ARM BASCILIC VEIN TRANSPOSITION;  Surgeon: Lonni GORMAN Blade, MD;  Location: [facility] OR;  Service: Vascular;  Laterality: Right;   BELOW KNEE LEG AMPUTATION Right 04/26/2016   BIOPSY OF SKIN SUBCUTANEOUS TISSUE AND/OR MUCOUS MEMBRANE  04/28/2023   Procedure: BIOPSY, SKIN, SUBCUTANEOUS TISSUE, OR MUCOUS MEMBRANE;  Surgeon: Shila Gustav GAILS, MD;  Location: [facility] ENDOSCOPY;  Service: Gastroenterology;;   CARDIAC CATHETERIZATION     CARDIAC DEFIBRILLATOR PLACEMENT  06/27/2013   Sub Q       BY DR FERNANDE   CATARACT EXTRACTION W/PHACO Right 08/06/2018   Procedure: CATARACT EXTRACTION PHACO AND INTRAOCULAR LENS PLACEMENT (IOC);  Surgeon: Harrie Agent, MD;  Location: [facility] ORS;  Service: Ophthalmology;  Laterality: Right;  CDE: 4.06   CATARACT EXTRACTION W/PHACO Left 08/20/2018   Procedure: CATARACT EXTRACTION PHACO AND INTRAOCULAR LENS PLACEMENT (IOC);  Surgeon: Harrie Agent, MD;  Location: [facility] ORS;  Service: Ophthalmology;  Laterality: Left;  CDE: 6.76   COLONOSCOPY WITH PROPOFOL  N/A 07/22/2015   Procedure: COLONOSCOPY WITH PROPOFOL ;  Surgeon: Victory LITTIE Legrand DOUGLAS, MD;  Location: [facility] ENDOSCOPY;  Service: Gastroenterology;  Laterality: N/A;   CORONARY LITHOTRIPSY N/A 04/14/2023   Procedure: CORONARY LITHOTRIPSY;  Surgeon: Anner Alm ORN, MD;  Location: [facility] INVASIVE CV LAB;  Service: Cardiovascular;  Laterality: N/A;  RCA   CORONARY STENT INTERVENTION N/A 04/12/2023   Procedure: CORONARY STENT INTERVENTION;  Surgeon: Swaziland, Peter M, MD;  Location: [facility] INVASIVE CV LAB;  Service: Cardiovascular;  Laterality: N/A;   CORONARY STENT INTERVENTION N/A  04/14/2023   Procedure: CORONARY STENT INTERVENTION;  Surgeon: Anner Alm ORN, MD;  Location: [facility] INVASIVE CV LAB;  Service: Cardiovascular;  Laterality: N/A;   ESOPHAGOGASTRODUODENOSCOPY N/A 04/28/2023   Procedure: EGD (ESOPHAGOGASTRODUODENOSCOPY);  Surgeon: Nandigam, Kavitha V, MD;  Location: [facility] ENDOSCOPY;  Service: Gastroenterology;  Laterality: N/A;   FEMORAL-POPLITEAL BYPASS GRAFT Right 03/31/2016   Procedure: BYPASS GRAFT FEMORAL-POPLITEAL ARTERY USING RIGHT GREATER SAPHENOUS NONREVERSED VEIN;  Surgeon: Lonni GORMAN Blade, MD;  Location: [facility] OR;  Service: Vascular;  Laterality: Right;   HERNIA REPAIR     I & D EXTREMITY Right 03/31/2016   Procedure: IRRIGATION AND DEBRIDEMENT FOOT;  Surgeon: Lonni GORMAN Blade, MD;  Location: [facility] OR;  Service: Vascular;  Laterality: Right;   IMPLANTABLE CARDIOVERTER DEFIBRILLATOR IMPLANT N/A 06/27/2013   Procedure: SUB Q ICD;  Surgeon: Elspeth JAYSON FERNANDE, MD;  Location: [facility] CATH LAB;  Service: Cardiovascular;  Laterality: N/A;   INTRAOPERATIVE ARTERIOGRAM Right 03/31/2016   Procedure: INTRA OPERATIVE ARTERIOGRAM;  Surgeon: Lonni GORMAN Blade, MD;  Location: [facility] OR;  Service: Vascular;  Laterality: Right;   IR GENERIC HISTORICAL Right 11/30/2015   IR THROMBECTOMY AV FISTULA W/THROMBOLYSIS/PTA INC/SHUNT/IMG RIGHT  11/30/2015 Marcey Moan, MD [facility]-INTER[facility] RAD   IR GENERIC HISTORICAL  11/30/2015   IR US  GUIDE VASC ACCESS RIGHT 11/30/2015 Marcey Moan, MD [facility]   IR GENERIC HISTORICAL Right 12/15/2015   IR THROMBECTOMY AV FISTULA W/THROMBOLYSIS/PTA/STENT INC/SHUNT/IMG RT 12/15/2015 Toribio Faes, MD [facility]   IR GENERIC HISTORICAL  12/15/2015   IR US  GUIDE VASC ACCESS RIGHT 12/15/2015 Toribio Faes, MD [facility]   IR GENERIC HISTORICAL  12/28/2015   IR FLUORO GUIDE CV LINE RIGHT 12/28/2015 Rome Hall, MD [facility]   IR GENERIC HISTORICAL  12/28/2015   IR US  GUIDE VASC ACCESS RIGHT 12/28/2015 Rome Hall, MD [facility]   LEFT A ND RIGHT  HEART CATH  01/30/2013   DR CHET   LEFT AND RIGHT HEART CATHETERIZATION WITH CORONARY ANGIOGRAM N/A 01/30/2013   Procedure: LEFT AND RIGHT HEART CATHETERIZATION WITH CORONARY ANGIOGRAM;  Surgeon: Toribio JONELLE Fuel, MD;  Location: [facility] CATH LAB;  Service: Cardiovascular;  Laterality: N/A;   LEFT HEART CATH AND CORONARY ANGIOGRAPHY N/A 04/12/2023   Procedure: LEFT HEART CATH AND CORONARY ANGIOGRAPHY;  Surgeon: Swaziland, Peter M, MD;  Location: [facility] INVASIVE CV LAB;  Service: Cardiovascular;  Laterality: N/A;   PERIPHERAL VASCULAR CATHETERIZATION Right 01/26/2015   Procedure: A/V Fistulagram;  Surgeon: Lonni GORMAN Blade, MD;  Location: [facility] INVASIVE CV LAB;  Service: Cardiovascular;  Laterality: Right;   reapea urethral surgery for recurrent obstruction  2011   TOTAL KNEE ARTHROPLASTY Right 2007   VEIN HARVEST Right 03/31/2016   Procedure: RIGHT GREATER SAPHENOUS VEIN HARVEST;  Surgeon: Lonni GORMAN Blade, MD;  Location: [facility] OR;  Service: Vascular;  Laterality: Right;   Social History:  reports that he quit smoking about 13 years ago. His smoking use included cigarettes. He started smoking about 45 years ago. He has a 64 pack-year smoking history. He has never used smokeless tobacco. He reports that he does not drink alcohol  and does not use drugs.  Allergies  Allergen Reactions   Epoetin  Alfa Other (See Comments)    Unknown    Ferumoxytol  Other (See Comments)    Unknown    Morphine Sulfate Rash and Other (See Comments)    Itches all over, red spots    Family History  Problem Relation Age of Onset   Bladder Cancer Mother    Alcohol  abuse Father    Melanoma Father    Stroke Maternal Grandmother    Heart Problems Maternal Grandmother        unknown   Diabetes Maternal Grandmother    Heart disease Maternal Grandfather    Prostate cancer Maternal Grandfather     Prior to Admission medications   Medication Sig Start Date End Date Taking? Authorizing Provider  acetaminophen  (TYLENOL ) 325  MG tablet Take 1-2 tablets (325-650 mg total) by mouth every 4 (four) hours as needed for mild pain. 11/29/19   Love, Sharlet GORMAN, PA-C  amiodarone  (PACERONE ) 200 MG tablet TAKE 1 TABLET ( 200 MG ) BY MOUTH TWICE DAILY FOR 20 DAYS, THEN 1 TABLET ( 200 MG TOTAL ) ONCE DAILY 09/18/23   Burchette, Wolm ORN, MD  apixaban  (ELIQUIS ) 5 MG TABS tablet Take 1 tablet (5 mg total) by mouth 2 (two) times daily. 05/22/23   Ghimire, Donalda HERO, MD  atorvastatin  (LIPITOR ) 80 MG tablet Take 1 tablet (80 mg total) by mouth at bedtime. 09/20/23   Burchette, Wolm ORN, MD  carvedilol  (COREG ) 6.25 MG tablet Take 1 tablet (6.25 mg total) by mouth 2 (two) times daily. 09/20/23  Micheal Wolm ORN, MD  cinacalcet  (SENSIPAR ) 30 MG tablet Take 1 tablet (30 mg total) by mouth Every Tuesday,Thursday,and Saturday with dialysis. 11/30/19   Love, Sharlet RAMAN, PA-C  clopidogrel  (PLAVIX ) 75 MG tablet Take 1 tablet (75 mg total) by mouth daily with breakfast. 08/14/23   Burchette, Wolm ORN, MD  Continuous Glucose Receiver (FREESTYLE LIBRE 3 READER) DEVI Use to check blood glucose TID 08/11/22   Burchette, Wolm ORN, MD  Continuous Glucose Sensor (FREESTYLE LIBRE 3 PLUS SENSOR) MISC USE AS DIRECTED TO CHECK GLUCOSE DAILY. CHANGE EVERY 15 DAYS 05/24/23   Burchette, Wolm ORN, MD  Elastic Bandages & Supports (ABDOMINAL BINDER/ELASTIC MED) MISC 1 kit by Does not apply route daily. 06/09/23   Miriam Norris, NP  gabapentin  (NEURONTIN ) 300 MG capsule Take 1 capsule (300 mg total) by mouth at bedtime. 04/15/23   Hongalgi, Anand D, MD  glucose blood test strip Check 1 time daily. E11.9 One Touch Ultra Blue Test Strips 03/10/14   Burchette, Wolm ORN, MD  HUMALOG  KWIKPEN 200 UNIT/ML KwikPen INJECT A MAXIMUM OF 28 UNITS SUBCUTANEOUSLY TWICE DAILY WITH LUNCH AND SUPPER PER SLIDING SCALE. APPOINTMENT REQUIRED FOR FUTURE REFILLS 08/16/23   Burchette, Wolm ORN, MD  Insulin  Pen Needle (BD PEN NEEDLE NANO U/F) 32G X 4 MM MISC USE 1 PEN NEEDLE SUBCUTANEOUSLY WITH INSULIN  4 TIMES  DAILY 12/04/19   Burchette, Wolm ORN, MD  midodrine  (PROAMATINE ) 10 MG tablet Take 1 tablet (10 mg total) by mouth every 8 (eight) hours. 09/20/23   Burchette, Wolm ORN, MD  multivitamin (RENA-VIT) TABS tablet Take 1 tablet by mouth once daily 07/06/21   Burchette, Wolm ORN, MD  nitroGLYCERIN  (NITROSTAT ) 0.4 MG SL tablet Place 1 tablet (0.4 mg total) under the tongue every 5 (five) minutes as needed for chest pain. 04/21/23   Burchette, Wolm ORN, MD  Olopatadine  HCl 0.2 % SOLN Place 1 drop into both eyes daily as needed (for allergies).     [provider]  VENTOLIN  HFA 108 (90 Base) MCG/ACT inhaler INHALE 1 TO 2 PUFFS BY MOUTH EVERY 6 HOURS AS NEEDED FOR WHEEZING FOR SHORTNESS OF BREATH 05/05/23   Micheal Wolm ORN, MD    Physical Exam: Vitals:   10/06/23 1342 10/06/23 1343 10/06/23 1347  BP:   (!) 140/45  Pulse:   76  Resp:   (!) 22  Temp:   98.6 F (37 C)  TempSrc:   Oral  SpO2: 98%  96%  Weight:  82.1 kg   Height:  5' 10 (1.778 m)     Constitutional: Elderly male currently in currently in no acute distress Eyes: PERRL, lids and conjunctivae normal ENMT: Mucous membranes are moist.  Normal dentition.  Neck: normal, supple,  Respiratory: Creased aeration noted on the right lung field.  O2 saturations currently maintained on 4 L of nasal cannula oxygen  Cardiovascular: Regular rate and rhythm, no murmurs / rubs / gallops. No extremity edema. 2+ pedal pulses. No carotid bruits.  Abdomen: no tenderness, no masses palpated. No hepatosplenomegaly. Bowel sounds positive.  Musculoskeletal: no clubbing / cyanosis.  Bilateral BKA's Skin: no rashes, lesions, ulcers. No induration Neurologic: CN 2-12 grossly intact. Sensation intact, DTR normal. Strength 5/5 in all 4.  Psychiatric: Normal judgment and insight. Alert and oriented x 3. Normal mood.   Data Reviewed:  EKG reveals sinus rhythm at 67 bpm.  Reviewed labs, imaging, and pertinent records as documented.  Assessment and  Plan: Acute respiratory distress secondary to pleural effusion Acute on chronic.  Patient presents with complaints of chest pain and shortness of breath x 1 month.    Had chest x-rays and was noted to have a significant right-sided pleural effusion.Currently on 4 L nasal cannula oxygen  to maintain O2 saturations greater than 90%. - Admit to a telemetry bed - Continuous pulse oximetry with nasal cannula oxygen  maintain O2 saturation greater than 90% - Consult placed for ultrasound-guided thoracentesis - Orders placed to check room air O2 saturations after thoracentesis  ESRD on HD Patient on a TTS schedule and last dialyzed on 9/25.  Labs noted sodium 133, potassium 3.4, CO2 25, chloride 91, BUN 30, creatinine 8.08, and anion gap 17. - Nephrology consulted for need of dialysis in a.m.  Heart failure with reduced EF Last echocardiogram noted EF to be around 40% with grade 1 diastolic dysfunction. - Fluid management with hemodialysis  Chest pain History of CAD Patient reports having right sided chest pain.  High-sensitivity troponin elevated at 149.  Prior history of PCI to the LAD/RCA in 02/2023. - Trend cardiac troponins - Continue Plavix , beta-blocker, and statin  Paroxysmal atrial fibrillation Patient appears to be in sinus rhythm at this time.  Patient with history of GI bleeds for which Eliquis  was briefly held but resumed back in May.  Hemoglobin stable at 13.6. - Continue amiodarone  and Eliquis   Hypotension Blood pressures currently maintained. - Continue midodrine   Controlled diabetes mellitus type 2, with long-term use of insulin  Admission glucose noted to be 197.  Last available hemoglobin A1c was 6.7. - Hypoglycemic protocols - Semglee  16 units nightly - CBGs before every meal with sensitive SSI - Adjust insulin  regimen as needed  PAD S/p bilateral BKA - Continue statin and Plavix   DVT prophylaxis: Eliquis  Advance Care Planning:   Code Status: Full Code     Consults: Nephrology  Family Communication: None  Severity of Illness: The appropriate patient status for this patient is OBSERVATION. Observation status is judged to be reasonable and necessary in order to provide the required intensity of service to ensure the patient's safety. The patient's presenting symptoms, physical exam findings, and initial radiographic and laboratory data in the context of their medical condition is felt to place them at decreased risk for further clinical deterioration. Furthermore, it is anticipated that the patient will be medically stable for discharge from the hospital within 2 midnights of admission.   Author: Maximino DELENA Sharps, MD 10/06/2023 3:54 PM  For on call review [email or web address].

## 2023-10-06 NOTE — Progress Notes (Signed)
 Patient arrived to 6N06 A&OX4 with a 5/10 pain located on his chest. Patient set up on 02 with 4L nasal cannula. Bed in lowest position and call light within reach.

## 2023-10-06 NOTE — Progress Notes (Signed)
 Aware that Mr. Gell has been admitted with dyspnea and pleural effusion. Dialyzes on TTS schedule.   Per records, currently on [place] O2 and will be going for thoracentesis today which should improve symptoms.  Will place dialysis orders for tomorrow, full consult to be done tomorrow.  Verified HD orders with outpatient unit: TTS - [facility] 4hour, 400/500, EDW 82.5kg, 1K/2.5Ca bath, TDC, no heparin  - no ESA - cinacalcet  90mg  PO q HD - calcitriol  0.75mcg PO q HD  Izetta Boehringer, PA-C [employer] Pager [phone]

## 2023-10-07 DIAGNOSIS — R6521 Severe sepsis with septic shock: Secondary | ICD-10-CM | POA: Diagnosis not present

## 2023-10-07 DIAGNOSIS — J690 Pneumonitis due to inhalation of food and vomit: Secondary | ICD-10-CM | POA: Diagnosis not present

## 2023-10-07 DIAGNOSIS — Z87891 Personal history of nicotine dependence: Secondary | ICD-10-CM | POA: Diagnosis not present

## 2023-10-07 DIAGNOSIS — Z7901 Long term (current) use of anticoagulants: Secondary | ICD-10-CM | POA: Diagnosis not present

## 2023-10-07 DIAGNOSIS — C7951 Secondary malignant neoplasm of bone: Secondary | ICD-10-CM | POA: Diagnosis present

## 2023-10-07 DIAGNOSIS — C782 Secondary malignant neoplasm of pleura: Secondary | ICD-10-CM | POA: Diagnosis not present

## 2023-10-07 DIAGNOSIS — J440 Chronic obstructive pulmonary disease with acute lower respiratory infection: Secondary | ICD-10-CM | POA: Diagnosis not present

## 2023-10-07 DIAGNOSIS — R0902 Hypoxemia: Secondary | ICD-10-CM | POA: Diagnosis present

## 2023-10-07 DIAGNOSIS — G9341 Metabolic encephalopathy: Secondary | ICD-10-CM | POA: Diagnosis not present

## 2023-10-07 DIAGNOSIS — I5043 Acute on chronic combined systolic (congestive) and diastolic (congestive) heart failure: Secondary | ICD-10-CM | POA: Diagnosis not present

## 2023-10-07 DIAGNOSIS — C9000 Multiple myeloma not having achieved remission: Secondary | ICD-10-CM | POA: Diagnosis present

## 2023-10-07 DIAGNOSIS — I5031 Acute diastolic (congestive) heart failure: Secondary | ICD-10-CM | POA: Diagnosis not present

## 2023-10-07 DIAGNOSIS — R579 Shock, unspecified: Secondary | ICD-10-CM | POA: Diagnosis not present

## 2023-10-07 DIAGNOSIS — Z95810 Presence of automatic (implantable) cardiac defibrillator: Secondary | ICD-10-CM | POA: Diagnosis not present

## 2023-10-07 DIAGNOSIS — I5020 Unspecified systolic (congestive) heart failure: Secondary | ICD-10-CM | POA: Diagnosis not present

## 2023-10-07 DIAGNOSIS — R918 Other nonspecific abnormal finding of lung field: Secondary | ICD-10-CM | POA: Diagnosis not present

## 2023-10-07 DIAGNOSIS — I480 Paroxysmal atrial fibrillation: Secondary | ICD-10-CM | POA: Diagnosis not present

## 2023-10-07 DIAGNOSIS — I2510 Atherosclerotic heart disease of native coronary artery without angina pectoris: Secondary | ICD-10-CM | POA: Diagnosis not present

## 2023-10-07 DIAGNOSIS — Z66 Do not resuscitate: Secondary | ICD-10-CM | POA: Diagnosis not present

## 2023-10-07 DIAGNOSIS — I468 Cardiac arrest due to other underlying condition: Secondary | ICD-10-CM | POA: Diagnosis not present

## 2023-10-07 DIAGNOSIS — I469 Cardiac arrest, cause unspecified: Secondary | ICD-10-CM | POA: Diagnosis not present

## 2023-10-07 DIAGNOSIS — N289 Disorder of kidney and ureter, unspecified: Secondary | ICD-10-CM | POA: Diagnosis not present

## 2023-10-07 DIAGNOSIS — R570 Cardiogenic shock: Secondary | ICD-10-CM | POA: Diagnosis not present

## 2023-10-07 DIAGNOSIS — Z992 Dependence on renal dialysis: Secondary | ICD-10-CM | POA: Diagnosis not present

## 2023-10-07 DIAGNOSIS — I739 Peripheral vascular disease, unspecified: Secondary | ICD-10-CM | POA: Diagnosis not present

## 2023-10-07 DIAGNOSIS — N186 End stage renal disease: Secondary | ICD-10-CM | POA: Diagnosis present

## 2023-10-07 DIAGNOSIS — Z1152 Encounter for screening for COVID-19: Secondary | ICD-10-CM | POA: Diagnosis not present

## 2023-10-07 DIAGNOSIS — Z9911 Dependence on respirator [ventilator] status: Secondary | ICD-10-CM | POA: Diagnosis not present

## 2023-10-07 DIAGNOSIS — A4152 Sepsis due to Pseudomonas: Secondary | ICD-10-CM | POA: Diagnosis not present

## 2023-10-07 DIAGNOSIS — E1122 Type 2 diabetes mellitus with diabetic chronic kidney disease: Secondary | ICD-10-CM | POA: Diagnosis present

## 2023-10-07 DIAGNOSIS — Z89512 Acquired absence of left leg below knee: Secondary | ICD-10-CM | POA: Diagnosis not present

## 2023-10-07 DIAGNOSIS — I959 Hypotension, unspecified: Secondary | ICD-10-CM | POA: Diagnosis not present

## 2023-10-07 DIAGNOSIS — J9601 Acute respiratory failure with hypoxia: Secondary | ICD-10-CM | POA: Diagnosis not present

## 2023-10-07 DIAGNOSIS — Y95 Nosocomial condition: Secondary | ICD-10-CM | POA: Diagnosis not present

## 2023-10-07 DIAGNOSIS — Z48813 Encounter for surgical aftercare following surgery on the respiratory system: Secondary | ICD-10-CM | POA: Diagnosis not present

## 2023-10-07 DIAGNOSIS — R0603 Acute respiratory distress: Secondary | ICD-10-CM | POA: Diagnosis not present

## 2023-10-07 DIAGNOSIS — Z89511 Acquired absence of right leg below knee: Secondary | ICD-10-CM | POA: Diagnosis not present

## 2023-10-07 DIAGNOSIS — I5023 Acute on chronic systolic (congestive) heart failure: Secondary | ICD-10-CM | POA: Diagnosis not present

## 2023-10-07 DIAGNOSIS — N2581 Secondary hyperparathyroidism of renal origin: Secondary | ICD-10-CM | POA: Diagnosis not present

## 2023-10-07 DIAGNOSIS — E871 Hypo-osmolality and hyponatremia: Secondary | ICD-10-CM | POA: Diagnosis not present

## 2023-10-07 DIAGNOSIS — J9811 Atelectasis: Secondary | ICD-10-CM | POA: Diagnosis not present

## 2023-10-07 DIAGNOSIS — Z7189 Other specified counseling: Secondary | ICD-10-CM | POA: Diagnosis not present

## 2023-10-07 DIAGNOSIS — I120 Hypertensive chronic kidney disease with stage 5 chronic kidney disease or end stage renal disease: Secondary | ICD-10-CM | POA: Diagnosis not present

## 2023-10-07 DIAGNOSIS — A419 Sepsis, unspecified organism: Secondary | ICD-10-CM | POA: Diagnosis not present

## 2023-10-07 DIAGNOSIS — Z515 Encounter for palliative care: Secondary | ICD-10-CM | POA: Diagnosis not present

## 2023-10-07 DIAGNOSIS — J9600 Acute respiratory failure, unspecified whether with hypoxia or hypercapnia: Secondary | ICD-10-CM | POA: Diagnosis not present

## 2023-10-07 DIAGNOSIS — J151 Pneumonia due to Pseudomonas: Secondary | ICD-10-CM | POA: Diagnosis not present

## 2023-10-07 DIAGNOSIS — D65 Disseminated intravascular coagulation [defibrination syndrome]: Secondary | ICD-10-CM | POA: Diagnosis not present

## 2023-10-07 DIAGNOSIS — E43 Unspecified severe protein-calorie malnutrition: Secondary | ICD-10-CM | POA: Diagnosis not present

## 2023-10-07 DIAGNOSIS — D631 Anemia in chronic kidney disease: Secondary | ICD-10-CM | POA: Diagnosis not present

## 2023-10-07 DIAGNOSIS — Z7902 Long term (current) use of antithrombotics/antiplatelets: Secondary | ICD-10-CM | POA: Diagnosis not present

## 2023-10-07 DIAGNOSIS — I132 Hypertensive heart and chronic kidney disease with heart failure and with stage 5 chronic kidney disease, or end stage renal disease: Secondary | ICD-10-CM | POA: Diagnosis present

## 2023-10-07 DIAGNOSIS — J90 Pleural effusion, not elsewhere classified: Secondary | ICD-10-CM | POA: Diagnosis present

## 2023-10-07 LAB — RENAL FUNCTION PANEL
Albumin: 2.4 g/dL — ABNORMAL LOW (ref 3.5–5.0)
Anion gap: 13 (ref 5–15)
BUN: 42 mg/dL — ABNORMAL HIGH (ref 8–23)
CO2: 24 mmol/L (ref 22–32)
Calcium: 9 mg/dL (ref 8.9–10.3)
Chloride: 96 mmol/L — ABNORMAL LOW (ref 98–111)
Creatinine, Ser: 9.42 mg/dL — ABNORMAL HIGH (ref 0.61–1.24)
GFR, Estimated: 6 mL/min — ABNORMAL LOW (ref >60)
Glucose, Bld: 154 mg/dL — ABNORMAL HIGH (ref 70–99)
Phosphorus: 4.1 mg/dL (ref 2.5–4.6)
Potassium: 3.6 mmol/L (ref 3.5–5.1)
Sodium: 133 mmol/L — ABNORMAL LOW (ref 135–145)

## 2023-10-07 LAB — HEPATITIS B SURFACE ANTIBODY, QUANTITATIVE: Hep B S AB Quant (Post): <3.5 m[IU]/mL — ABNORMAL LOW

## 2023-10-07 LAB — CBC
HCT: 39.9 % (ref 39.0–52.0)
Hemoglobin: 13.1 g/dL (ref 13.0–17.0)
MCH: 28.9 pg (ref 26.0–34.0)
MCHC: 32.8 g/dL (ref 30.0–36.0)
MCV: 87.9 fL (ref 80.0–100.0)
Platelets: 229 K/uL (ref 150–400)
RBC: 4.54 MIL/uL (ref 4.22–5.81)
RDW: 14.7 % (ref 11.5–15.5)
WBC: 10.1 K/uL (ref 4.0–10.5)
nRBC: 0 % (ref 0.0–0.2)

## 2023-10-07 LAB — GLUCOSE, CAPILLARY
Glucose-Capillary: 215 mg/dL — ABNORMAL HIGH (ref 70–99)
Glucose-Capillary: 192 mg/dL — ABNORMAL HIGH (ref 70–99)
Glucose-Capillary: 240 mg/dL — ABNORMAL HIGH (ref 70–99)

## 2023-10-07 MED ORDER — HEPARIN SODIUM (PORCINE) 1000 UNIT/ML IJ SOLN
INTRAMUSCULAR | Status: AC
  Filled 2023-10-07: qty 4

## 2023-10-07 MED ORDER — OXYCODONE HCL 5 MG PO TABS
5.0000 mg | ORAL_TABLET | ORAL | Status: DC | PRN
  Administered 2023-10-07 – 2023-10-08 (×3): 5 mg via ORAL
  Administered 2023-10-08 – 2023-10-09 (×2): 10 mg via ORAL
  Filled 2023-10-07 (×2): qty 2
  Filled 2023-10-07: qty 1
  Filled 2023-10-07: qty 2
  Filled 2023-10-07: qty 1

## 2023-10-07 MED ORDER — LIDOCAINE-PRILOCAINE 2.5-2.5 % EX CREA: 1.0000 | TOPICAL_CREAM | CUTANEOUS | Status: DC | PRN

## 2023-10-07 MED ORDER — ALTEPLASE 2 MG IJ SOLR: 2.0000 mg | Freq: Once | INTRAMUSCULAR | Status: DC | PRN

## 2023-10-07 MED ORDER — ANTICOAGULANT SODIUM CITRATE 4% (200MG/5ML) IV SOLN: 5.0000 mL | Status: DC | PRN

## 2023-10-07 MED ORDER — PENTAFLUOROPROP-TETRAFLUOROETH EX AERO: 1.0000 | INHALATION_SPRAY | CUTANEOUS | Status: DC | PRN

## 2023-10-07 MED ORDER — HEPARIN SODIUM (PORCINE) 1000 UNIT/ML DIALYSIS
1000.0000 [IU] | INTRAMUSCULAR | Status: DC | PRN
  Administered 2023-10-07: 3800 [IU]

## 2023-10-07 MED ORDER — MIDODRINE HCL 5 MG PO TABS
ORAL_TABLET | ORAL | Status: AC
Start: 2023-10-07 — End: 2023-10-07
  Filled 2023-10-07: qty 2

## 2023-10-07 MED ORDER — NEPRO/CARBSTEADY PO LIQD
237.0000 mL | Freq: Two times a day (BID) | ORAL | Status: DC
  Filled 2023-10-07 (×2): qty 237

## 2023-10-07 NOTE — Plan of Care (Signed)
   Problem: Education: Goal: Ability to describe self-care measures that may prevent or decrease complications (Diabetes Survival Skills Education) will improve Outcome: Progressing

## 2023-10-07 NOTE — Care Management Obs Status (Signed)
 MEDICARE OBSERVATION STATUS NOTIFICATION   Patient Details  Name: Anthony Bullock MRN: [id number] Date of Birth: 1960-05-22   Medicare Observation Status Notification Given:  Yes    Robynn Eileen Hoose, RN 10/07/2023, 4:18 PM

## 2023-10-07 NOTE — Progress Notes (Signed)
 Received patient in bed.Alert and oriented x 4. Consent verified.  Access used: Right hd catheter that worked well.Dressing change done today.  Duration of treatment; 3.5 HOURS.  Net uf : 700 cc.  Medicine given: Midodrine  10 mg.  Hemo comment: Not tolerating prescribed uf of 2 liters,sbp were dropping near last hour of his treatment,last 40 minutes of treatment he signed an AMA.  Hand off to the patient's nurse,back into his room via transporter with stable condition.

## 2023-10-07 NOTE — Consult Note (Signed)
 Reason for Consult: Continuity of ESRD care Referring Physician: Nilda Fendt MD [facility])  HPI:  63 year old man with past medical history significant for type 2 diabetes mellitus, status post bilateral below-knee amputations, congestive heart failure with reduced ejection fraction (EF 30 to 35%), atrial fibrillation on anticoagulation with Eliquis , dyslipidemia, hypotension on midodrine  and end-stage renal disease on hemodialysis on a TTS schedule at the [facility] unit in [place].  He was admitted to the hospital yesterday with worsening shortness of breath along with right-sided chest pain and found to have a large right pleural effusion with bibasilar atelectasis versus consolidation.  He underwent thoracentesis by interventional radiology yesterday yielding 1.85 L serous looking fluid.  When seen today he complains of continued pain in the right side of his chest that radiates up to his right shoulder.  Shortness of breath slightly better since thoracentesis.  He denies any preceding fever, chills or hemoptysis.  Past Medical History:  Diagnosis Date   AICD (automatic cardioverter/defibrillator) present    [employer]   Allergic rhinitis    Anemia    Arthritis    Chronic systolic heart failure (HCC)    a. ECHO (12/2012) EF 25-30%, HK entireanteroseptal myocardium //  b.  EF 25%, diffuse HK, grade 1 diastolic dysfunction, MAC, mild LAE, normal RVSF, trivial pericardial effusion   COPD (chronic obstructive pulmonary disease) (HCC)    Diabetes mellitus type II    Diabetic nephropathy (HCC)    Diabetic neuropathy (HCC)    ESRD on hemodialysis (HCC)    started HD June 2017, goes to [facility] HD unit, Dr Edwardo   History of cardiac catheterization    a.Myoview  1/15:  There is significant left ventricular dysfunction. There may be slight scar at the apex. There is no significant ischemia. LV Ejection Fraction: 27%  //  b. RHC/LHC (1/15) with mean RA 6, PA 47/22 mean 33, mean PCWP 20, PVR 2.5  WU, CI 2.5; 80% dLAD stenosis, 70% diffuse large D.     History of kidney stones    Hyperlipidemia    Hypertension    Kidney stones    NICM (nonischemic cardiomyopathy) (HCC)    Primarily nonischemic.  Echo (12/14) with EF 25-30%.  Echo (3/15) with EF 25%, mild to moderately dilated LV, normal RV size and systolic function.     Osteomyelitis (HCC)    left fifth ray   Pneumonia    Urethral stricture    Wears glasses     Past Surgical History:  Procedure Laterality Date   ABDOMINAL AORTOGRAM W/LOWER EXTREMITY N/A 03/30/2016   Procedure: Abdominal Aortogram w/Lower Extremity;  Surgeon: Lonni GORMAN Blade, MD;  Location: [facility] INVASIVE CV LAB;  Service: Cardiovascular;  Laterality: N/A;   AMPUTATION Right 04/26/2016   Procedure: Right Below Knee Amputation;  Surgeon: Jerona Harden GAILS, MD;  Location: [facility] OR;  Service: Orthopedics;  Laterality: Right;   AMPUTATION Left 08/21/2019   Procedure: LEFT FOOT 5TH RAY AMPUTATION;  Surgeon: Harden Jerona GAILS, MD;  Location: [facility] OR;  Service: Orthopedics;  Laterality: Left;   AMPUTATION Left 11/13/2019   Procedure: LEFT BELOW KNEE AMPUTATION;  Surgeon: Harden Jerona GAILS, MD;  Location: [facility] OR;  Service: Orthopedics;  Laterality: Left;   AV FISTULA PLACEMENT Right 09/08/2015   Procedure: INSERTION OF 4-85mm x 45cm  ARTERIOVENOUS (AV) GORE-TEX GRAFT RIGHT UPPER  ARM;  Surgeon: Lonni GORMAN Blade, MD;  Location: [facility] OR;  Service: Vascular;  Laterality: Right;   AV FISTULA PLACEMENT Left 01/14/2016   Procedure: CREATION  OF LEFT UPPER ARM ARTERIOVENOUS FISTULA;  Surgeon: Lonni GORMAN Blade, MD;  Location: [facility] OR;  Service: Vascular;  Laterality: Left;   BASCILIC VEIN TRANSPOSITION Right 08/22/2014   Procedure: RIGHT UPPER ARM BASCILIC VEIN TRANSPOSITION;  Surgeon: Lonni GORMAN Blade, MD;  Location: [facility] OR;  Service: Vascular;  Laterality: Right;   BELOW KNEE LEG AMPUTATION Right 04/26/2016   BIOPSY OF SKIN SUBCUTANEOUS TISSUE AND/OR MUCOUS MEMBRANE  04/28/2023    Procedure: BIOPSY, SKIN, SUBCUTANEOUS TISSUE, OR MUCOUS MEMBRANE;  Surgeon: Shila Gustav GAILS, MD;  Location: [facility] ENDOSCOPY;  Service: Gastroenterology;;   CARDIAC CATHETERIZATION     CARDIAC DEFIBRILLATOR PLACEMENT  06/27/2013   Sub Q       BY DR FERNANDE   CATARACT EXTRACTION W/PHACO Right 08/06/2018   Procedure: CATARACT EXTRACTION PHACO AND INTRAOCULAR LENS PLACEMENT (IOC);  Surgeon: Harrie Agent, MD;  Location: [facility] ORS;  Service: Ophthalmology;  Laterality: Right;  CDE: 4.06   CATARACT EXTRACTION W/PHACO Left 08/20/2018   Procedure: CATARACT EXTRACTION PHACO AND INTRAOCULAR LENS PLACEMENT (IOC);  Surgeon: Harrie Agent, MD;  Location: [facility] ORS;  Service: Ophthalmology;  Laterality: Left;  CDE: 6.76   COLONOSCOPY WITH PROPOFOL  N/A 07/22/2015   Procedure: COLONOSCOPY WITH PROPOFOL ;  Surgeon: Victory LITTIE Legrand DOUGLAS, MD;  Location: [facility] ENDOSCOPY;  Service: Gastroenterology;  Laterality: N/A;   CORONARY LITHOTRIPSY N/A 04/14/2023   Procedure: CORONARY LITHOTRIPSY;  Surgeon: Anner Alm ORN, MD;  Location: [facility] INVASIVE CV LAB;  Service: Cardiovascular;  Laterality: N/A;  RCA   CORONARY STENT INTERVENTION N/A 04/12/2023   Procedure: CORONARY STENT INTERVENTION;  Surgeon: Swaziland, Peter M, MD;  Location: [facility] INVASIVE CV LAB;  Service: Cardiovascular;  Laterality: N/A;   CORONARY STENT INTERVENTION N/A 04/14/2023   Procedure: CORONARY STENT INTERVENTION;  Surgeon: Anner Alm ORN, MD;  Location: [facility] INVASIVE CV LAB;  Service: Cardiovascular;  Laterality: N/A;   ESOPHAGOGASTRODUODENOSCOPY N/A 04/28/2023   Procedure: EGD (ESOPHAGOGASTRODUODENOSCOPY);  Surgeon: Nandigam, Kavitha V, MD;  Location: [facility] ENDOSCOPY;  Service: Gastroenterology;  Laterality: N/A;   FEMORAL-POPLITEAL BYPASS GRAFT Right 03/31/2016   Procedure: BYPASS GRAFT FEMORAL-POPLITEAL ARTERY USING RIGHT GREATER SAPHENOUS NONREVERSED VEIN;  Surgeon: Lonni GORMAN Blade, MD;  Location: [facility] OR;  Service: Vascular;  Laterality: Right;   HERNIA REPAIR     I & D  EXTREMITY Right 03/31/2016   Procedure: IRRIGATION AND DEBRIDEMENT FOOT;  Surgeon: Lonni GORMAN Blade, MD;  Location: [facility] OR;  Service: Vascular;  Laterality: Right;   IMPLANTABLE CARDIOVERTER DEFIBRILLATOR IMPLANT N/A 06/27/2013   Procedure: SUB Q ICD;  Surgeon: Elspeth JAYSON FERNANDE, MD;  Location: [facility] CATH LAB;  Service: Cardiovascular;  Laterality: N/A;   INTRAOPERATIVE ARTERIOGRAM Right 03/31/2016   Procedure: INTRA OPERATIVE ARTERIOGRAM;  Surgeon: Lonni GORMAN Blade, MD;  Location: [facility] OR;  Service: Vascular;  Laterality: Right;   IR GENERIC HISTORICAL Right 11/30/2015   IR THROMBECTOMY AV FISTULA W/THROMBOLYSIS/PTA INC/SHUNT/IMG RIGHT 11/30/2015 Marcey Moan, MD [facility]   IR GENERIC HISTORICAL  11/30/2015   IR US  GUIDE VASC ACCESS RIGHT 11/30/2015 Marcey Moan, MD [facility]   IR GENERIC HISTORICAL Right 12/15/2015   IR THROMBECTOMY AV FISTULA W/THROMBOLYSIS/PTA/STENT INC/SHUNT/IMG RT 12/15/2015 Toribio Faes, MD [facility]   IR GENERIC HISTORICAL  12/15/2015   IR US  GUIDE VASC ACCESS RIGHT 12/15/2015 Toribio Faes, MD [facility]   IR GENERIC HISTORICAL  12/28/2015   IR FLUORO GUIDE CV LINE RIGHT 12/28/2015 Rome Hall, MD [facility]   IR GENERIC HISTORICAL  12/28/2015   IR US  GUIDE VASC ACCESS RIGHT  12/28/2015 Rome Hall, MD [facility]   LEFT A ND RIGHT HEART CATH  01/30/2013   DR CHET   LEFT AND RIGHT HEART CATHETERIZATION WITH CORONARY ANGIOGRAM N/A 01/30/2013   Procedure: LEFT AND RIGHT HEART CATHETERIZATION WITH CORONARY ANGIOGRAM;  Surgeon: Toribio JONELLE Fuel, MD;  Location: [facility] CATH LAB;  Service: Cardiovascular;  Laterality: N/A;   LEFT HEART CATH AND CORONARY ANGIOGRAPHY N/A 04/12/2023   Procedure: LEFT HEART CATH AND CORONARY ANGIOGRAPHY;  Surgeon: Swaziland, Peter M, MD;  Location: [facility] INVASIVE CV LAB;  Service: Cardiovascular;  Laterality: N/A;   PERIPHERAL VASCULAR CATHETERIZATION Right 01/26/2015   Procedure: A/V Fistulagram;  Surgeon: Lonni GORMAN Blade,  MD;  Location: [facility] INVASIVE CV LAB;  Service: Cardiovascular;  Laterality: Right;   reapea urethral surgery for recurrent obstruction  2011   TOTAL KNEE ARTHROPLASTY Right 2007   VEIN HARVEST Right 03/31/2016   Procedure: RIGHT GREATER SAPHENOUS VEIN HARVEST;  Surgeon: Lonni GORMAN Blade, MD;  Location: [facility] OR;  Service: Vascular;  Laterality: Right;    Family History  Problem Relation Age of Onset   Bladder Cancer Mother    Alcohol  abuse Father    Melanoma Father    Stroke Maternal Grandmother    Heart Problems Maternal Grandmother        unknown   Diabetes Maternal Grandmother    Heart disease Maternal Grandfather    Prostate cancer Maternal Grandfather     Social History:  reports that he quit smoking about 13 years ago. His smoking use included cigarettes. He started smoking about 45 years ago. He has a 64 pack-year smoking history. He has never used smokeless tobacco. He reports that he does not drink alcohol  and does not use drugs.  Allergies:  Allergies  Allergen Reactions   Epoetin  Alfa Other (See Comments)    Unknown    Ferumoxytol  Other (See Comments)    Unknown    Morphine Sulfate Rash and Other (See Comments)    Itches all over, red spots    Medications: I have reviewed the patient's current medications. Scheduled:  amiodarone   200 mg Oral Daily   apixaban   5 mg Oral BID   atorvastatin   80 mg Oral QHS   carvedilol   6.25 mg Oral BID   Chlorhexidine  Gluconate Cloth  6 each Topical Q0600   clopidogrel   75 mg Oral Q breakfast   insulin  aspart  0-9 Units Subcutaneous TID WC   insulin  glargine  16 Units Subcutaneous QHS   midodrine   10 mg Oral Q T,Th,Sa-HD   multivitamin  1 tablet Oral Daily   sodium chloride  flush  3 mL Intravenous Q12H    Results for orders placed or performed during the hospital encounter of 10/06/23 (from the past 48 hours)  CBC with Differential     Status: Abnormal   Collection Time: 10/06/23  1:59 PM  Result Value Ref Range   WBC 9.9  4.0 - 10.5 K/uL   RBC 4.79 4.22 - 5.81 MIL/uL   Hemoglobin 13.6 13.0 - 17.0 g/dL   HCT 57.6 60.9 - 47.9 %   MCV 88.3 80.0 - 100.0 fL   MCH 28.4 26.0 - 34.0 pg   MCHC 32.2 30.0 - 36.0 g/dL   RDW 85.1 88.4 - 84.4 %   Platelets 240 150 - 400 K/uL   nRBC 0.0 0.0 - 0.2 %   Neutrophils Relative % 71 %   Neutro Abs 7.0 1.7 - 7.7 K/uL   Lymphocytes Relative 10 %   Lymphs  Abs 1.0 0.7 - 4.0 K/uL   Monocytes Relative 15 %   Monocytes Absolute 1.5 (H) 0.1 - 1.0 K/uL   Eosinophils Relative 3 %   Eosinophils Absolute 0.3 0.0 - 0.5 K/uL   Basophils Relative 1 %   Basophils Absolute 0.1 0.0 - 0.1 K/uL   Immature Granulocytes 0 %   Abs Immature Granulocytes 0.03 0.00 - 0.07 K/uL    Comment: Performed at [facility], [address] [place], [place] [address]  Brain natriuretic peptide     Status: Abnormal   Collection Time: 10/06/23  1:59 PM  Result Value Ref Range   B Natriuretic Peptide 526.2 (H) 0.0 - 100.0 pg/mL    Comment: Performed at [facility], [address], [place], [place] [address]  Resp panel by RT-PCR (RSV, Flu A&B, Covid) Anterior Nasal Swab     Status: None   Collection Time: 10/06/23  1:59 PM   Specimen: Anterior Nasal Swab  Result Value Ref Range   SARS Coronavirus 2 by RT PCR NEGATIVE NEGATIVE   Influenza A by PCR NEGATIVE NEGATIVE   Influenza B by PCR NEGATIVE NEGATIVE    Comment: (NOTE) The Xpert Xpress SARS-CoV-2/FLU/RSV plus assay is intended as an aid in the diagnosis of influenza from Nasopharyngeal swab specimens and should not be used as a sole basis for treatment. Nasal washings and aspirates are unacceptable for Xpert Xpress SARS-CoV-2/FLU/RSV testing.  Fact Sheet for Patients: BloggerCourse.com  Fact Sheet for Healthcare Providers: SeriousBroker.it  This test is not yet approved or cleared by the [place]  FDA and has been authorized for detection and/or diagnosis of SARS-CoV-2 by FDA  under an Emergency Use Authorization (EUA). This EUA will remain in effect (meaning this test can be used) for the duration of the COVID-19 declaration under Section 564(b)(1) of the Act, 21 U.S.C. section 360bbb-3(b)(1), unless the authorization is terminated or revoked.     Resp Syncytial Virus by PCR NEGATIVE NEGATIVE    Comment: (NOTE) Fact Sheet for Patients: BloggerCourse.com  Fact Sheet for Healthcare Providers: SeriousBroker.it  This test is not yet approved or cleared by the [place]  FDA and has been authorized for detection and/or diagnosis of SARS-CoV-2 by FDA under an Emergency Use Authorization (EUA). This EUA will remain in effect (meaning this test can be used) for the duration of the COVID-19 declaration under Section 564(b)(1) of the Act, 21 U.S.C. section 360bbb-3(b)(1), unless the authorization is terminated or revoked.  Performed at [facility], [address], [place], [place] [address]   Troponin I (High Sensitivity)     Status: Abnormal   Collection Time: 10/06/23  1:59 PM  Result Value Ref Range   Troponin I (High Sensitivity) 149 (HH) <18 ng/L    Comment: FIRST ATTEMPTED @1605  10/06/23 E,[place] CRITICAL RESULT CALLED TO, READ BACK BY AND VERIFIED WITH B. HANLEY RN @1615  ON 10/06/23 [facility] (NOTE) Elevated high sensitivity troponin I (hsTnI) values and significant  changes across serial measurements may suggest ACS but many other  chronic and acute conditions are known to elevate hsTnI results.  Refer to the Links section for chest pain algorithms and additional  guidance. Performed at [facility], [address] [place], [place] [address]   Comprehensive metabolic panel     Status: Abnormal   Collection Time: 10/06/23  2:00 PM  Result Value Ref Range   Sodium 133 (L) 135 - 145 mmol/L   Potassium 3.4 (L) 3.5 - 5.1 mmol/L  Chloride 91 (L) 98 - 111 mmol/L   CO2 25 22 - 32 mmol/L    Glucose, Bld 237 (H) 70 - 99 mg/dL    Comment: Glucose reference range applies only to samples taken after fasting for at least 8 hours.   BUN 30 (H) 8 - 23 mg/dL   Creatinine, Ser 1.91 (H) 0.61 - 1.24 mg/dL   Calcium  9.5 8.9 - 10.3 mg/dL   Total Protein 7.1 6.5 - 8.1 g/dL   Albumin  2.7 (L) 3.5 - 5.0 g/dL   AST 24 15 - 41 U/L   ALT 17 0 - 44 U/L   Alkaline Phosphatase 101 38 - 126 U/L   Total Bilirubin 0.5 0.0 - 1.2 mg/dL   GFR, Estimated 7 (L) >60 mL/min    Comment: (NOTE) Calculated using the CKD-EPI Creatinine Equation (2021)    Anion gap 17 (H) 5 - 15    Comment: Performed at [facility], [address], [place], [place] [address]  Lipase, blood     Status: None   Collection Time: 10/06/23  2:00 PM  Result Value Ref Range   Lipase 25 11 - 51 U/L    Comment: Performed at [facility], [address] [place], [place] [address]  Troponin I (High Sensitivity)     Status: Abnormal   Collection Time: 10/06/23  5:33 PM  Result Value Ref Range   Troponin I (High Sensitivity) 155 (HH) <18 ng/L    Comment: CRITICAL VALUE NOTED. VALUE IS CONSISTENT WITH PREVIOUSLY REPORTED/CALLED VALUE (NOTE) Elevated high sensitivity troponin I (hsTnI) values and significant  changes across serial measurements may suggest ACS but many other  chronic and acute conditions are known to elevate hsTnI results.  Refer to the Links section for chest pain algorithms and additional  guidance. Performed at [facility], [address], [place], [place] [address]   Hepatitis B surface antigen     Status: None   Collection Time: 10/06/23  5:33 PM  Result Value Ref Range   Hepatitis B Surface Ag NON REACTIVE NON REACTIVE    Comment: Performed at [facility], [address], [place], [place] [address]  Hepatitis B surface antibody,quantitative     Status: Abnormal   Collection Time: 10/06/23  5:33 PM  Result Value Ref Range   Hep B S AB Quant (Post) <3.5 (L)  Immunity>10 mIU/mL    Comment: (NOTE)  Status of Immunity                     Anti-HBs Level  ------------------                     -------------- Inconsistent with Immunity                  0.0 - 10.0 Consistent with Immunity                         >10.0 Performed At: [facility] [address] [place], [place] [id number] Jennette Shorter MD Ey:[id number]   HIV Antibody (routine testing w rflx)     Status: None   Collection Time: 10/06/23  5:33 PM  Result Value Ref Range   HIV Screen 4th Generation wRfx Non Reactive Non Reactive    Comment: Performed at [facility], [address], [place], [place] [address]  Glucose, capillary     Status: Abnormal  Collection Time: 10/06/23  5:56 PM  Result Value Ref Range   Glucose-Capillary 196 (H) 70 - 99 mg/dL    Comment: Glucose reference range applies only to samples taken after fasting for at least 8 hours.  Glucose, capillary     Status: Abnormal   Collection Time: 10/06/23  8:02 PM  Result Value Ref Range   Glucose-Capillary 179 (H) 70 - 99 mg/dL    Comment: Glucose reference range applies only to samples taken after fasting for at least 8 hours.  Renal function panel     Status: Abnormal   Collection Time: 10/07/23  5:23 AM  Result Value Ref Range   Sodium 133 (L) 135 - 145 mmol/L   Potassium 3.6 3.5 - 5.1 mmol/L   Chloride 96 (L) 98 - 111 mmol/L   CO2 24 22 - 32 mmol/L   Glucose, Bld 154 (H) 70 - 99 mg/dL    Comment: Glucose reference range applies only to samples taken after fasting for at least 8 hours.   BUN 42 (H) 8 - 23 mg/dL   Creatinine, Ser 0.57 (H) 0.61 - 1.24 mg/dL   Calcium  9.0 8.9 - 10.3 mg/dL   Phosphorus 4.1 2.5 - 4.6 mg/dL   Albumin  2.4 (L) 3.5 - 5.0 g/dL   GFR, Estimated 6 (L) >60 mL/min    Comment: (NOTE) Calculated using the CKD-EPI Creatinine Equation (2021)    Anion gap 13 5 - 15    Comment: Performed at [facility], [address], [place], [place] [address]  CBC     Status: None    Collection Time: 10/07/23  5:23 AM  Result Value Ref Range   WBC 10.1 4.0 - 10.5 K/uL   RBC 4.54 4.22 - 5.81 MIL/uL   Hemoglobin 13.1 13.0 - 17.0 g/dL   HCT 60.0 60.9 - 47.9 %   MCV 87.9 80.0 - 100.0 fL   MCH 28.9 26.0 - 34.0 pg   MCHC 32.8 30.0 - 36.0 g/dL   RDW 85.2 88.4 - 84.4 %   Platelets 229 150 - 400 K/uL   nRBC 0.0 0.0 - 0.2 %    Comment: Performed at [facility], [address] [place], [place] [address]  Glucose, capillary     Status: Abnormal   Collection Time: 10/07/23  7:59 AM  Result Value Ref Range   Glucose-Capillary 215 (H) 70 - 99 mg/dL    Comment: Glucose reference range applies only to samples taken after fasting for at least 8 hours.   *Note: Due to a large number of results and/or encounters for the requested time period, some results have not been displayed. A complete set of results can be found in Results Review.    DG Chest 1 View Result Date: 10/06/2023 CLINICAL DATA:  Right thoracentesis EXAM: CHEST  1 VIEW COMPARISON:  10/06/2023 FINDINGS: Single frontal view of the chest demonstrates stable defibrillator and right internal jugular dialysis catheter. Decreased right pleural effusion after interval thoracentesis, with mild to moderate residual fluid and consolidation at the right lung base. No evidence of pneumothorax. Left chest is clear. IMPRESSION: 1. No evidence of pneumothorax after right thoracentesis. Decreased right effusion, with mild to moderate residual fluid and consolidation at the right lung base. Electronically Signed   By: Ozell Daring M.D.   On: 10/06/2023 16:53   US  THORACENTESIS ASP PLEURAL SPACE W/IMG GUIDE Result Date: 10/06/2023 INDICATION: Patient with request for therapeutic thoracentesis for large right pleural effusion. EXAM: ULTRASOUND  GUIDED THERAPEUTIC RIGHT THORACENTESIS MEDICATIONS: 9 mL 1% lidocaine  COMPLICATIONS: None immediate. PROCEDURE: An ultrasound guided thoracentesis was thoroughly discussed with the patient  and questions answered. The benefits, risks, alternatives and complications were also discussed. The patient understands and wishes to proceed with the procedure. Written consent was obtained. Ultrasound was performed to localize and mark an adequate pocket of fluid in the right chest. The area was then prepped and draped in the normal sterile fashion. 1% Lidocaine  was used for local anesthesia. Under ultrasound guidance a 6 Fr Safe-T-Centesis catheter was introduced. Thoracentesis was performed. The catheter was removed and a dressing applied. FINDINGS: A total of approximately 1850 mL of amber fluid was removed. IMPRESSION: Successful ultrasound guided right thoracentesis yielding 1850 mL of pleural fluid. Performed and dictated by Laymon Coast, NP Electronically Signed   By: Marcey Moan M.D.   On: 10/06/2023 16:49   US  Abdomen Limited RUQ (LIVER/GB) Result Date: 10/06/2023 CLINICAL DATA:  [id number] RUQ abdominal pain [id number] EXAM: ULTRASOUND ABDOMEN LIMITED RIGHT UPPER QUADRANT COMPARISON:  09/28/2009 FINDINGS: Gallbladder: Single small gallstone. Moderate circumferential wall thickening of the gallbladder. No pericholecystic fluid. No sonographic Murphy's sign noted by sonographer. Common bile duct: Diameter: 4 mm Liver: Normal echogenicity. No focal lesion identified. No intrahepatic biliary ductal dilation. Portal vein is patent on color Doppler imaging with normal direction of blood flow towards the liver. Right Kidney: Possible posterior shadowing overlying the partially visualized renal parenchyma, possibly nonobstructive calculi. Other: Moderate right pleural effusion. IMPRESSION: 1. Mild gallbladder wall thickening, which in the absence of pericholecystic fluid and a sonographic Murphy's sign, may be due to reactive to biliary stasis, upper abdominal inflammation, acute hepatitis, or volume overload from either CHF or renal failure. Laboratory correlation recommended. 2. Moderate volume right  pleural effusion. 3. Scattered posterior shadowing overlying the partially visualized right kidney, possibly representing nonobstructive renal calculi. Electronically Signed   By: Rogelia Myers M.D.   On: 10/06/2023 15:41   DG Chest Port 1 View Result Date: 10/06/2023 CLINICAL DATA:  Shortness of breath EXAM: PORTABLE CHEST 1 VIEW COMPARISON:  10/05/2023 FINDINGS: Right central venous catheter with tip over the cavoatrial junction region. Cardiac pacemaker. Shallow inspiration. Cardiac enlargement. Large right pleural effusion with basilar atelectasis and/or consolidation. This is mildly progressed since prior study, possibly due to differences in patient positioning. The left lung is clear. Visualized mediastinal contours appear intact. Degenerative changes in the spine. IMPRESSION: Large right pleural effusion with basilar atelectasis and/or consolidation demonstrating mild progression. Electronically Signed   By: Elsie Gravely M.D.   On: 10/06/2023 15:08   DG Chest 2 View Result Date: 10/05/2023 CLINICAL DATA:  Shortness of breath. EXAM: CHEST - 2 VIEW COMPARISON:  05/21/2023 FINDINGS: Right IJ dialysis catheter unchanged. Left-sided pacer unchanged. Lungs are adequately inflated as the left lung is clear. Interval worsening of a moderate to large right pleural effusion likely with associated compressive atelectasis. Effusion occupies approximately 3/4 of the right hemithorax. Cardiomediastinal silhouette and remainder of the exam is unchanged. IMPRESSION: Interval worsening of a moderate to large right pleural effusion likely with associated compressive atelectasis. Electronically Signed   By: Toribio Agreste M.D.   On: 10/05/2023 11:43    Review of Systems  Constitutional:  Positive for fatigue. Negative for appetite change, chills and fever.  HENT:  Negative for nosebleeds, sore throat and trouble swallowing.   Eyes:  Negative for redness and visual disturbance.  Respiratory:  Positive for  shortness of breath. Negative for cough, chest tightness and  wheezing.   Cardiovascular:  Positive for chest pain. Negative for leg swelling.  Gastrointestinal:  Negative for abdominal pain, diarrhea, nausea and vomiting.  Endocrine: Negative for polydipsia, polyphagia and polyuria.  Musculoskeletal:  Positive for arthralgias. Negative for gait problem, joint swelling and myalgias.       Right shoulder  Skin:  Negative for rash.  Neurological:  Positive for weakness. Negative for light-headedness and headaches.   Blood pressure (!) 106/50, pulse 61, temperature 98.4 F (36.9 C), resp. rate 18, height 5' 10 (1.778 m), weight 84.7 kg, SpO2 96%. Physical Exam Vitals and nursing note reviewed.  Constitutional:      Appearance: He is well-developed and normal weight. He is ill-appearing.  HENT:     Head: Normocephalic and atraumatic.     Mouth/Throat:     Mouth: Mucous membranes are moist.     Pharynx: Oropharynx is clear.  Eyes:     Extraocular Movements: Extraocular movements intact.  Neck:     Vascular: No JVD.  Cardiovascular:     Rate and Rhythm: Normal rate and regular rhythm.  Pulmonary:     Effort: Pulmonary effort is normal.     Breath sounds: Examination of the right-middle field reveals decreased breath sounds. Examination of the right-lower field reveals decreased breath sounds. Decreased breath sounds present. No rales.     Comments: Right IJ TDC Abdominal:     General: Bowel sounds are normal.     Palpations: Abdomen is soft.  Musculoskeletal:     Cervical back: Normal range of motion and neck supple.     Right lower leg: No tenderness.     Left lower leg: No tenderness.  Neurological:     Mental Status: He is alert.    Assessment/Plan: 1.  Right-sided pleural effusion: Manifesting with acute worsening of shortness of breath and right-sided chest pain.  He is status post thoracentesis yesterday and he had some improvement of shortness of breath but continues to  have some right sided chest pain (likely suspected pleuritis).  Does not appear that pleural tap was sent for any additional analysis/testing.  No evidence of concomitant pneumonia. 2.  End-stage renal disease: Usually on TTS dialysis schedule that we have resumed here in the hospital via his right IJ TDC.  Attempting volume unloading that would be somewhat challenging in the setting of his hypotension with chronic midodrine  use. 3.  Hypertension: Blood pressures borderline, continue to monitor on midodrine . 4.  Hyponatremia: Mild, monitor with hemodialysis/ultrafiltration on subsequent labs.  Restrict oral fluids to 1.2 L/day. 5.  Secondary hyperparathyroidism: Calcium  level elevated when corrected for hypoalbuminemia and phosphorus levels are at goal.  Continue to monitor off of binders at this time. 6.  Nutrition: Continue renal diet with ongoing protein supplementation.  Gordy MARLA Blanch 10/07/2023, 10:38 AM

## 2023-10-07 NOTE — Progress Notes (Signed)
 PROGRESS NOTE  Anthony Bullock FMW:[id number] DOB: 09/23/1960 DOA: 10/06/2023 PCP: Micheal Wolm LELON, MD   LOS: 0 days   Brief Narrative / Interim history: Anthony Bullock is a 63 y.o. male with medical history significant of HTN, HLD, chronic systolic CHF, paroxysmal atrial fibrillation, CAD ESRD on HD (TTS), DM type II, COPD, PVD s/p bilateral BKA, chronic anemia, and GI bleed who presents with shortness of breath and right-sided chest pain. The patient experiences shortness of breath and right-sided chest pain radiating to the right shoulder. They underwent dialysis the previous day, during which x-rays were ordered.  Imaging on admission showed large right-sided pleural effusion and he was admitted to the hospital  Subjective / 24h Interval events: Seen during and after dialysis, reports ongoing significant weakness, and shortness of breath.   Assesement and Plan: Principal problem Hypoxemia due to pleural effusion -came to the hospital with chest discomfort and shortness of breath, pleuritic type chest pain for the past month.  Chest x-ray on admission showed large right-sided pleural effusion, radiology was consulted and he is status post 1.8 L removed.  Unfortunately, no studies were sent for the fluid as this is new onset large right-sided pleural effusion  Active problems ESRD-underwent dialysis today.  Other than the pleural effusion he does not appear to be overtly fluid overloaded.  Ultrafiltration limited by chronic hypotension  Chronic hypotension-continue midodrine   CAD-most recent cardiac cath April 2025 status post 2 site PCI of the RCA using score flex angioplasty followed by shockwave lithotripsy.  Continue Plavix  along with Eliquis   COPD, tobacco use in remission-has 30-pack-year smoking history, quit a long time ago, he does not remember whether it was 2001 or 2011.  Has a pulse ox at home, normally sees O2 sats in the low to mid 90s  Chronic systolic CHF-most recent 2D  echo done in April 2025 shows LVEF 40%, global hypokinesis.  He has grade 1 diastolic dysfunction, RV systolic function and size were normal - Due to unexplained worsening of his fluid status, worsening dyspnea, I will repeat a 2D echocardiogram  PAF-currently in sinus, continue amiodarone , apixaban , carvedilol   Hyperlipidemia-continue statin  Hyponatremia-mild, fluid balance with hemodialysis  Type 2 diabetes mellitus, with hyperglycemia-continue glargine, sliding scale  Lab Results  Component Value Date   HGBA1C 6.7 (A) 09/20/2023   CBG (last 3)  Recent Labs    10/06/23 2002 10/07/23 0759 10/07/23 1623  GLUCAP 179* 215* 192*    Scheduled Meds:  amiodarone   200 mg Oral Daily   apixaban   5 mg Oral BID   atorvastatin   80 mg Oral QHS   carvedilol   6.25 mg Oral BID   Chlorhexidine  Gluconate Cloth  6 each Topical Q0600   clopidogrel   75 mg Oral Q breakfast   feeding supplement (NEPRO CARB STEADY)  237 mL Oral BID BM   insulin  aspart  0-9 Units Subcutaneous TID WC   insulin  glargine  16 Units Subcutaneous QHS   midodrine   10 mg Oral Q T,Th,Sa-HD   multivitamin  1 tablet Oral Daily   sodium chloride  flush  3 mL Intravenous Q12H   Continuous Infusions: PRN Meds:.acetaminophen  **OR** acetaminophen , albuterol , oxyCODONE   Current Outpatient Medications  Medication Instructions   acetaminophen  (TYLENOL ) 325-650 mg, Oral, Every 4 hours PRN   amiodarone  (PACERONE ) 200 MG tablet TAKE 1 TABLET ( 200 MG ) BY MOUTH TWICE DAILY FOR 20 DAYS, THEN 1 TABLET ( 200 MG TOTAL ) ONCE DAILY   apixaban  (ELIQUIS ) 5 mg, Oral, 2  times daily   atorvastatin  (LIPITOR ) 80 mg, Oral, Daily at bedtime   carvedilol  (COREG ) 6.25 mg, Oral, 2 times daily   cinacalcet  (SENSIPAR ) 30 mg, Oral, Every T-Th-Sa (Hemodialysis)   clopidogrel  (PLAVIX ) 75 mg, Oral, Daily with breakfast   Continuous Glucose Receiver (FREESTYLE LIBRE 3 READER) DEVI Use to check blood glucose TID   Continuous Glucose Sensor (FREESTYLE  LIBRE 3 PLUS SENSOR) MISC USE AS DIRECTED TO CHECK GLUCOSE DAILY. CHANGE EVERY 15 DAYS   Elastic Bandages & Supports (ABDOMINAL BINDER/ELASTIC MED) MISC 1 kit, Does not apply, Daily   gabapentin  (NEURONTIN ) 300 mg, Oral, Daily at bedtime   glucose blood test strip Check 1 time daily. E11.9 One Touch Ultra Blue Test Strips   HUMALOG  KWIKPEN 200 UNIT/ML KwikPen INJECT A MAXIMUM OF 28 UNITS SUBCUTANEOUSLY TWICE DAILY WITH LUNCH AND SUPPER PER SLIDING SCALE. APPOINTMENT REQUIRED FOR FUTURE REFILLS   Insulin  Pen Needle (BD PEN NEEDLE NANO U/F) 32G X 4 MM MISC USE 1 PEN NEEDLE SUBCUTANEOUSLY WITH INSULIN  4 TIMES DAILY   lidocaine  (LIDODERM ) 5 % 1 patch, Daily PRN   midodrine  (PROAMATINE ) 10 mg, Oral, Every 8 hours   multivitamin (RENA-VIT) TABS tablet Take 1 tablet by mouth once daily   nitroGLYCERIN  (NITROSTAT ) 0.4 mg, Sublingual, Every 5 min PRN   Olopatadine  HCl 0.2 % SOLN 1 drop, Daily PRN   VENTOLIN  HFA 108 (90 Base) MCG/ACT inhaler INHALE 1 TO 2 PUFFS BY MOUTH EVERY 6 HOURS AS NEEDED FOR WHEEZING FOR SHORTNESS OF BREATH    Diet Orders (From admission, onward)     Start     Ordered   10/06/23 1639  Diet renal/carb modified with fluid restriction Diet-HS Snack? Nothing; Fluid restriction: 1200 mL Fluid; Room service appropriate? Yes; Fluid consistency: Thin  Diet effective now       Question Answer Comment  Diet-HS Snack? Nothing   Fluid restriction: 1200 mL Fluid   Room service appropriate? Yes   Fluid consistency: Thin      10/06/23 1639            DVT prophylaxis:  apixaban  (ELIQUIS ) tablet 5 mg   Lab Results  Component Value Date   PLT 229 10/07/2023      Code Status: Full Code  Family Communication: wife at bedside   Status is: Inpatient Remains inpatient appropriate because: Hypoxemia,   Level of care: Telemetry Medical  Consultants:  Nephrology  Objective: Vitals:   10/07/23 1223 10/07/23 1230 10/07/23 1312 10/07/23 1627  BP: 92/74 (!) 127/52 (!) 127/58  (!) 130/49  Pulse: 63 61 68 67  Resp: (!) 25 19 18 17   Temp:  97.9 F (36.6 C) 98.5 F (36.9 C) 98 F (36.7 C)  TempSrc:   Oral Oral  SpO2: 99% 98% 91% 95%  Weight:  84.4 kg    Height:        Intake/Output Summary (Last 24 hours) at 10/07/2023 1654 Last data filed at 10/07/2023 1230 Gross per 24 hour  Intake --  Output 700 ml  Net -700 ml   Wt Readings from Last 3 Encounters:  10/07/23 84.4 kg  09/20/23 89.8 kg  09/18/23 89.1 kg    Examination:  Constitutional: NAD Eyes: no scleral icterus ENMT: Mucous membranes are moist.  Neck: normal, supple Respiratory: Diminished at the bases, no wheezing Cardiovascular: Regular rate and rhythm, no murmurs / rubs / gallops. No LE edema.  Abdomen: non distended, no tenderness. Bowel sounds positive.  Musculoskeletal: no clubbing / cyanosis.  Data Reviewed: I have independently reviewed following labs and imaging studies   CBC Recent Labs  Lab 10/06/23 1359 10/07/23 0523  WBC 9.9 10.1  HGB 13.6 13.1  HCT 42.3 39.9  PLT 240 229  MCV 88.3 87.9  MCH 28.4 28.9  MCHC 32.2 32.8  RDW 14.8 14.7  LYMPHSABS 1.0  --   MONOABS 1.5*  --   EOSABS 0.3  --   BASOSABS 0.1  --     Recent Labs  Lab 10/06/23 1359 10/06/23 1400 10/07/23 0523  NA  --  133* 133*  K  --  3.4* 3.6  CL  --  91* 96*  CO2  --  25 24  GLUCOSE  --  237* 154*  BUN  --  30* 42*  CREATININE  --  8.08* 9.42*  CALCIUM   --  9.5 9.0  AST  --  24  --   ALT  --  17  --   ALKPHOS  --  101  --   BILITOT  --  0.5  --   ALBUMIN   --  2.7* 2.4*  BNP 526.2*  --   --     ------------------------------------------------------------------------------------------------------------------ No results for input(s): CHOL, HDL, LDLCALC, TRIG, CHOLHDL, LDLDIRECT in the last 72 hours.  Lab Results  Component Value Date   HGBA1C 6.7 (A) 09/20/2023    ------------------------------------------------------------------------------------------------------------------ No results for input(s): TSH, T4TOTAL, T3FREE, THYROIDAB in the last 72 hours.  Invalid input(s): FREET3  Cardiac Enzymes No results for input(s): CKMB, TROPONINI, MYOGLOBIN in the last 168 hours.  Invalid input(s): CK ------------------------------------------------------------------------------------------------------------------    Component Value Date/Time   BNP 526.2 (H) 10/06/2023 1359    CBG: Recent Labs  Lab 10/06/23 1756 10/06/23 2002 10/07/23 0759 10/07/23 1623  GLUCAP 196* 179* 215* 192*    Recent Results (from the past 240 hours)  Resp panel by RT-PCR (RSV, Flu A&B, Covid) Anterior Nasal Swab     Status: None   Collection Time: 10/06/23  1:59 PM   Specimen: Anterior Nasal Swab  Result Value Ref Range Status   SARS Coronavirus 2 by RT PCR NEGATIVE NEGATIVE Final   Influenza A by PCR NEGATIVE NEGATIVE Final   Influenza B by PCR NEGATIVE NEGATIVE Final    Comment: (NOTE) The Xpert Xpress SARS-CoV-2/FLU/RSV plus assay is intended as an aid in the diagnosis of influenza from Nasopharyngeal swab specimens and should not be used as a sole basis for treatment. Nasal washings and aspirates are unacceptable for Xpert Xpress SARS-CoV-2/FLU/RSV testing.  Fact Sheet for Patients: BloggerCourse.com  Fact Sheet for Healthcare Providers: SeriousBroker.it  This test is not yet approved or cleared by the [place]  FDA and has been authorized for detection and/or diagnosis of SARS-CoV-2 by FDA under an Emergency Use Authorization (EUA). This EUA will remain in effect (meaning this test can be used) for the duration of the COVID-19 declaration under Section 564(b)(1) of the Act, 21 U.S.C. section 360bbb-3(b)(1), unless the authorization is terminated or revoked.     Resp Syncytial  Virus by PCR NEGATIVE NEGATIVE Final    Comment: (NOTE) Fact Sheet for Patients: BloggerCourse.com  Fact Sheet for Healthcare Providers: SeriousBroker.it  This test is not yet approved or cleared by the [place]  FDA and has been authorized for detection and/or diagnosis of SARS-CoV-2 by FDA under an Emergency Use Authorization (EUA). This EUA will remain in effect (meaning this test can be used) for the duration of the COVID-19 declaration under Section 564(b)(1) of the Act, 21  U.S.C. section 360bbb-3(b)(1), unless the authorization is terminated or revoked.  Performed at [facility], [name] [place], [place] [address]      Radiology Studies: No results found.   Nilda Fendt, MD, PhD [facility]  Between 7 am - 7 pm I am available, please contact me via Amion (for emergencies) or Securechat (non urgent messages)  Between 7 pm - 7 am I am not available, please contact night coverage MD/APP via Amion

## 2023-10-07 NOTE — Plan of Care (Signed)
  Problem: Fluid Volume: Goal: Ability to maintain a balanced intake and output will improve Outcome: Progressing   Problem: Nutritional: Goal: Maintenance of adequate nutrition will improve Outcome: Progressing   Problem: Activity: Goal: Risk for activity intolerance will decrease Outcome: Progressing   Problem: Pain Managment: Goal: General experience of comfort will improve and/or be controlled Outcome: Progressing

## 2023-10-07 NOTE — TOC Progression Note (Signed)
 Transition of Care [facility]) - Progression Note    Patient Details  Name: Anthony Bullock MRN: [id number] Date of Birth: 11-21-1960  Transition of Care [facility]) CM/SW Contact  Robynn Eileen Hoose, RN Phone Number: 10/07/2023, 3:57 PM  Clinical Narrative:   Unable to complete moon letter due to patient being out of the room.                      Expected Discharge Plan and Services                                               Social Drivers of Health (SDOH) Interventions SDOH Screenings   Food Insecurity: No Food Insecurity (10/06/2023)  Housing: Low Risk  (10/06/2023)  Transportation Needs: No Transportation Needs (10/06/2023)  Utilities: Not At Risk (10/06/2023)  Alcohol  Screen: Low Risk  (02/03/2023)  Depression (PHQ2-9): Low Risk  (02/03/2023)  Financial Resource Strain: Low Risk  (02/03/2023)  Physical Activity: Inactive (02/03/2023)  Social Connections: Socially Integrated (04/27/2023)  Stress: No Stress Concern Present (02/03/2023)  Tobacco Use: Medium Risk (10/06/2023)  Health Literacy: Adequate Health Literacy (02/03/2023)    Readmission Risk Interventions    05/01/2023    1:22 PM 04/12/2023    3:14 PM 03/08/2023   10:44 AM  Readmission Risk Prevention Plan  Transportation Screening Complete Complete Complete  PCP or Specialist Appt within 3-5 Days   Complete  HRI or Home Care Consult   Complete  Social Work Consult for Recovery Care Planning/Counseling   Complete  Palliative Care Screening   Not Applicable  Medication Review Oceanographer) Referral to Pharmacy Referral to Pharmacy Complete  PCP or Specialist appointment within 3-5 days of discharge Complete    HRI or Home Care Consult Complete Complete   SW Recovery Care/Counseling Consult Complete Complete   Palliative Care Screening Not Applicable Not Applicable   Skilled Nursing Facility Not Applicable Not Applicable

## 2023-10-08 ENCOUNTER — Inpatient Hospital Stay (HOSPITAL_COMMUNITY)

## 2023-10-08 DIAGNOSIS — J9811 Atelectasis: Secondary | ICD-10-CM | POA: Diagnosis not present

## 2023-10-08 DIAGNOSIS — Z95810 Presence of automatic (implantable) cardiac defibrillator: Secondary | ICD-10-CM | POA: Diagnosis not present

## 2023-10-08 DIAGNOSIS — D631 Anemia in chronic kidney disease: Secondary | ICD-10-CM | POA: Diagnosis not present

## 2023-10-08 DIAGNOSIS — Z992 Dependence on renal dialysis: Secondary | ICD-10-CM | POA: Diagnosis not present

## 2023-10-08 DIAGNOSIS — R0902 Hypoxemia: Secondary | ICD-10-CM | POA: Diagnosis not present

## 2023-10-08 DIAGNOSIS — I5031 Acute diastolic (congestive) heart failure: Secondary | ICD-10-CM

## 2023-10-08 DIAGNOSIS — E871 Hypo-osmolality and hyponatremia: Secondary | ICD-10-CM | POA: Diagnosis not present

## 2023-10-08 DIAGNOSIS — Z48813 Encounter for surgical aftercare following surgery on the respiratory system: Secondary | ICD-10-CM | POA: Diagnosis not present

## 2023-10-08 DIAGNOSIS — N2581 Secondary hyperparathyroidism of renal origin: Secondary | ICD-10-CM | POA: Diagnosis not present

## 2023-10-08 DIAGNOSIS — R918 Other nonspecific abnormal finding of lung field: Secondary | ICD-10-CM | POA: Diagnosis not present

## 2023-10-08 DIAGNOSIS — N186 End stage renal disease: Secondary | ICD-10-CM | POA: Diagnosis not present

## 2023-10-08 DIAGNOSIS — J90 Pleural effusion, not elsewhere classified: Secondary | ICD-10-CM | POA: Diagnosis not present

## 2023-10-08 DIAGNOSIS — I120 Hypertensive chronic kidney disease with stage 5 chronic kidney disease or end stage renal disease: Secondary | ICD-10-CM | POA: Diagnosis not present

## 2023-10-08 LAB — RENAL FUNCTION PANEL
Albumin: 2.4 g/dL — ABNORMAL LOW (ref 3.5–5.0)
Anion gap: 13 (ref 5–15)
BUN: 30 mg/dL — ABNORMAL HIGH (ref 8–23)
CO2: 24 mmol/L (ref 22–32)
Calcium: 8.6 mg/dL — ABNORMAL LOW (ref 8.9–10.3)
Chloride: 96 mmol/L — ABNORMAL LOW (ref 98–111)
Creatinine, Ser: 7.66 mg/dL — ABNORMAL HIGH (ref 0.61–1.24)
GFR, Estimated: 7 mL/min — ABNORMAL LOW (ref >60)
Glucose, Bld: 170 mg/dL — ABNORMAL HIGH (ref 70–99)
Phosphorus: 3.3 mg/dL (ref 2.5–4.6)
Potassium: 3.6 mmol/L (ref 3.5–5.1)
Sodium: 133 mmol/L — ABNORMAL LOW (ref 135–145)

## 2023-10-08 LAB — BODY FLUID CELL COUNT WITH DIFFERENTIAL
Eos, Fluid: 0 %
Lymphs, Fluid: 15 %
Monocyte-Macrophage-Serous Fluid: 12 % — ABNORMAL LOW (ref 50–90)
Neutrophil Count, Fluid: 73 % — ABNORMAL HIGH (ref 0–25)
Other Cells, Fluid: ABNORMAL %
Total Nucleated Cell Count, Fluid: 778 uL (ref 0–1000)

## 2023-10-08 LAB — ECHOCARDIOGRAM COMPLETE
AR max vel: 3.13 cm2
AV Peak grad: 2.7 mmHg
Ao pk vel: 0.82 m/s
Area-P 1/2: 4.06 cm2
Height: 70 in
S' Lateral: 4.3 cm
Weight: 2977.09 [oz_av]

## 2023-10-08 LAB — GLUCOSE, CAPILLARY
Glucose-Capillary: 179 mg/dL — ABNORMAL HIGH (ref 70–99)
Glucose-Capillary: 270 mg/dL — ABNORMAL HIGH (ref 70–99)
Glucose-Capillary: 234 mg/dL — ABNORMAL HIGH (ref 70–99)

## 2023-10-08 LAB — PROTEIN, PLEURAL OR PERITONEAL FLUID: Total protein, fluid: 5.5 g/dL

## 2023-10-08 LAB — GLUCOSE, PLEURAL OR PERITONEAL FLUID: Glucose, Fluid: 160 mg/dL

## 2023-10-08 LAB — LACTATE DEHYDROGENASE, PLEURAL OR PERITONEAL FLUID: LD, Fluid: 660 U/L — ABNORMAL HIGH (ref 3–23)

## 2023-10-08 MED ORDER — LIDOCAINE HCL (PF) 1 % IJ SOLN
8.0000 mL | Freq: Once | INTRAMUSCULAR | Status: AC
  Administered 2023-10-08: 8 mL

## 2023-10-08 MED ORDER — IOHEXOL 350 MG/ML SOLN
75.0000 mL | Freq: Once | INTRAVENOUS | Status: AC | PRN
  Administered 2023-10-08: 75 mL via INTRAVENOUS

## 2023-10-08 NOTE — Progress Notes (Signed)
 PROGRESS NOTE  Anthony Bullock FMW:[id number] DOB: 12/08/1960 DOA: 10/06/2023 PCP: Micheal Wolm LELON, MD   LOS: 1 day   Brief Narrative / Interim history: Anthony Bullock is a 63 y.o. male with medical history significant of HTN, HLD, chronic systolic CHF, paroxysmal atrial fibrillation, CAD ESRD on HD (TTS), DM type II, COPD, PVD s/p bilateral BKA, chronic anemia, and GI bleed who presents with shortness of breath and right-sided chest pain. The patient experiences shortness of breath and right-sided chest pain radiating to the right shoulder. They underwent dialysis the previous day, during which x-rays were ordered.  Imaging on admission showed large right-sided pleural effusion and he was admitted to the hospital  Subjective / 24h Interval events: He is feeling a little bit stronger.  He is not short of breath in bed, has not been up and walking  Assesement and Plan: Principal problem Hypoxemia due to pleural effusion -came to the hospital with chest discomfort and shortness of breath, pleuritic type chest pain for the past month.  Chest x-ray on admission showed large right-sided pleural effusion, radiology was consulted and he is status post 1.8 L removed.  Unfortunately, no studies were sent for the fluid as this is new onset large right-sided pleural effusion - Repeat chest x-ray this morning shows he has persistent pleural effusion, will repeat thoracentesis and send labs to evaluate possible etiologies.  Will send cytology as well  Active problems ESRD-underwent HD yesterday.  Nephrology following. Ultrafiltration limited by chronic hypotension  Chronic hypotension-continue midodrine   CAD-most recent cardiac cath April 2025 status post 2 site PCI of the RCA using score flex angioplasty followed by shockwave lithotripsy.  Continue Plavix  along with Eliquis   COPD, tobacco use in remission-has 30-pack-year smoking history, quit a long time ago, he does not remember whether it was 2001 or  2011.  Has a pulse ox at home, normally sees O2 sats in the low to mid 90s  Chronic systolic CHF-most recent 2D echo done in April 2025 shows LVEF 40%, global hypokinesis.  He has grade 1 diastolic dysfunction, RV systolic function and size were normal - Due to unexplained worsening of his fluid status, worsening dyspnea, repeat 2D echo is pending  PAF-currently in sinus, continue amiodarone , apixaban , carvedilol   Hyperlipidemia-continue statin  Hyponatremia-mild, fluid balance with hemodialysis  Type 2 diabetes mellitus, with hyperglycemia-continue glargine, sliding scale  Lab Results  Component Value Date   HGBA1C 6.7 (A) 09/20/2023   CBG (last 3)  Recent Labs    10/07/23 1623 10/07/23 2030 10/08/23 0807  GLUCAP 192* 240* 179*    Scheduled Meds:  amiodarone   200 mg Oral Daily   apixaban   5 mg Oral BID   atorvastatin   80 mg Oral QHS   carvedilol   6.25 mg Oral BID   Chlorhexidine  Gluconate Cloth  6 each Topical Q0600   clopidogrel   75 mg Oral Q breakfast   feeding supplement (NEPRO CARB STEADY)  237 mL Oral BID BM   insulin  aspart  0-9 Units Subcutaneous TID WC   insulin  glargine  16 Units Subcutaneous QHS   midodrine   10 mg Oral Q T,Th,Sa-HD   multivitamin  1 tablet Oral Daily   sodium chloride  flush  3 mL Intravenous Q12H   Continuous Infusions: PRN Meds:.acetaminophen  **OR** acetaminophen , albuterol , oxyCODONE   Current Outpatient Medications  Medication Instructions   acetaminophen  (TYLENOL ) 325-650 mg, Oral, Every 4 hours PRN   amiodarone  (PACERONE ) 200 MG tablet TAKE 1 TABLET ( 200 MG ) BY MOUTH TWICE  DAILY FOR 20 DAYS, THEN 1 TABLET ( 200 MG TOTAL ) ONCE DAILY   apixaban  (ELIQUIS ) 5 mg, Oral, 2 times daily   atorvastatin  (LIPITOR ) 80 mg, Oral, Daily at bedtime   carvedilol  (COREG ) 6.25 mg, Oral, 2 times daily   cinacalcet  (SENSIPAR ) 30 mg, Oral, Every T-Th-Sa (Hemodialysis)   clopidogrel  (PLAVIX ) 75 mg, Oral, Daily with breakfast   Continuous Glucose Receiver  (FREESTYLE LIBRE 3 READER) DEVI Use to check blood glucose TID   Continuous Glucose Sensor (FREESTYLE LIBRE 3 PLUS SENSOR) MISC USE AS DIRECTED TO CHECK GLUCOSE DAILY. CHANGE EVERY 15 DAYS   Elastic Bandages & Supports (ABDOMINAL BINDER/ELASTIC MED) MISC 1 kit, Does not apply, Daily   gabapentin  (NEURONTIN ) 300 mg, Oral, Daily at bedtime   glucose blood test strip Check 1 time daily. E11.9 One Touch Ultra Blue Test Strips   HUMALOG  KWIKPEN 200 UNIT/ML KwikPen INJECT A MAXIMUM OF 28 UNITS SUBCUTANEOUSLY TWICE DAILY WITH LUNCH AND SUPPER PER SLIDING SCALE. APPOINTMENT REQUIRED FOR FUTURE REFILLS   Insulin  Pen Needle (BD PEN NEEDLE NANO U/F) 32G X 4 MM MISC USE 1 PEN NEEDLE SUBCUTANEOUSLY WITH INSULIN  4 TIMES DAILY   lidocaine  (LIDODERM ) 5 % 1 patch, Daily PRN   midodrine  (PROAMATINE ) 10 mg, Oral, Every 8 hours   multivitamin (RENA-VIT) TABS tablet Take 1 tablet by mouth once daily   nitroGLYCERIN  (NITROSTAT ) 0.4 mg, Sublingual, Every 5 min PRN   Olopatadine  HCl 0.2 % SOLN 1 drop, Daily PRN   VENTOLIN  HFA 108 (90 Base) MCG/ACT inhaler INHALE 1 TO 2 PUFFS BY MOUTH EVERY 6 HOURS AS NEEDED FOR WHEEZING FOR SHORTNESS OF BREATH    Diet Orders (From admission, onward)     Start     Ordered   10/06/23 1639  Diet renal/carb modified with fluid restriction Diet-HS Snack? Nothing; Fluid restriction: 1200 mL Fluid; Room service appropriate? Yes; Fluid consistency: Thin  Diet effective now       Question Answer Comment  Diet-HS Snack? Nothing   Fluid restriction: 1200 mL Fluid   Room service appropriate? Yes   Fluid consistency: Thin      10/06/23 1639            DVT prophylaxis:  apixaban  (ELIQUIS ) tablet 5 mg   Lab Results  Component Value Date   PLT 229 10/07/2023      Code Status: Full Code  Family Communication: wife at bedside   Status is: Inpatient Remains inpatient appropriate because: Repeat thoracentesis   Level of care: Telemetry Medical  Consultants:   Nephrology  Objective: Vitals:   10/07/23 1627 10/07/23 2034 10/08/23 0529 10/08/23 0808  BP: (!) 130/49 (!) 124/53 (!) 125/50 (!) 101/51  Pulse: 67 65 77 80  Resp: 17 18 18 18   Temp: 98 F (36.7 C)  99.5 F (37.5 C) (!) 97.5 F (36.4 C)  TempSrc: Oral  Oral   SpO2: 95% 94% 95% 90%  Weight:      Height:        Intake/Output Summary (Last 24 hours) at 10/08/2023 1040 Last data filed at 10/07/2023 1530 Gross per 24 hour  Intake 120 ml  Output 700 ml  Net -580 ml   Wt Readings from Last 3 Encounters:  10/07/23 84.4 kg  09/20/23 89.8 kg  09/18/23 89.1 kg    Examination:  Constitutional: NAD Eyes: lids and conjunctivae normal, no scleral icterus ENMT: mmm Neck: normal, supple Respiratory: clear to auscultation bilaterally, no wheezing, no crackles. Normal respiratory effort.  Cardiovascular: Regular  rate and rhythm, no murmurs / rubs / gallops. No LE edema. Abdomen: soft, no distention, no tenderness. Bowel sounds positive.    Data Reviewed: I have independently reviewed following labs and imaging studies   CBC Recent Labs  Lab 10/06/23 1359 10/07/23 0523  WBC 9.9 10.1  HGB 13.6 13.1  HCT 42.3 39.9  PLT 240 229  MCV 88.3 87.9  MCH 28.4 28.9  MCHC 32.2 32.8  RDW 14.8 14.7  LYMPHSABS 1.0  --   MONOABS 1.5*  --   EOSABS 0.3  --   BASOSABS 0.1  --     Recent Labs  Lab 10/06/23 1359 10/06/23 1400 10/07/23 0523 10/08/23 0244  NA  --  133* 133* 133*  K  --  3.4* 3.6 3.6  CL  --  91* 96* 96*  CO2  --  25 24 24   GLUCOSE  --  237* 154* 170*  BUN  --  30* 42* 30*  CREATININE  --  8.08* 9.42* 7.66*  CALCIUM   --  9.5 9.0 8.6*  AST  --  24  --   --   ALT  --  17  --   --   ALKPHOS  --  101  --   --   BILITOT  --  0.5  --   --   ALBUMIN   --  2.7* 2.4* 2.4*  BNP 526.2*  --   --   --     ------------------------------------------------------------------------------------------------------------------ No results for input(s): CHOL, HDL, LDLCALC,  TRIG, CHOLHDL, LDLDIRECT in the last 72 hours.  Lab Results  Component Value Date   HGBA1C 6.7 (A) 09/20/2023   ------------------------------------------------------------------------------------------------------------------ No results for input(s): TSH, T4TOTAL, T3FREE, THYROIDAB in the last 72 hours.  Invalid input(s): FREET3  Cardiac Enzymes No results for input(s): CKMB, TROPONINI, MYOGLOBIN in the last 168 hours.  Invalid input(s): CK ------------------------------------------------------------------------------------------------------------------    Component Value Date/Time   BNP 526.2 (H) 10/06/2023 1359    CBG: Recent Labs  Lab 10/06/23 2002 10/07/23 0759 10/07/23 1623 10/07/23 2030 10/08/23 0807  GLUCAP 179* 215* 192* 240* 179*    Recent Results (from the past 240 hours)  Resp panel by RT-PCR (RSV, Flu A&B, Covid) Anterior Nasal Swab     Status: None   Collection Time: 10/06/23  1:59 PM   Specimen: Anterior Nasal Swab  Result Value Ref Range Status   SARS Coronavirus 2 by RT PCR NEGATIVE NEGATIVE Final   Influenza A by PCR NEGATIVE NEGATIVE Final   Influenza B by PCR NEGATIVE NEGATIVE Final    Comment: (NOTE) The Xpert Xpress SARS-CoV-2/FLU/RSV plus assay is intended as an aid in the diagnosis of influenza from Nasopharyngeal swab specimens and should not be used as a sole basis for treatment. Nasal washings and aspirates are unacceptable for Xpert Xpress SARS-CoV-2/FLU/RSV testing.  Fact Sheet for Patients: BloggerCourse.com  Fact Sheet for Healthcare Providers: SeriousBroker.it  This test is not yet approved or cleared by the [place]  FDA and has been authorized for detection and/or diagnosis of SARS-CoV-2 by FDA under an Emergency Use Authorization (EUA). This EUA will remain in effect (meaning this test can be used) for the duration of the COVID-19 declaration under  Section 564(b)(1) of the Act, 21 U.S.C. section 360bbb-3(b)(1), unless the authorization is terminated or revoked.     Resp Syncytial Virus by PCR NEGATIVE NEGATIVE Final    Comment: (NOTE) Fact Sheet for Patients: BloggerCourse.com  Fact Sheet for Healthcare Providers: SeriousBroker.it  This test is not  yet approved or cleared by the [place]  FDA and has been authorized for detection and/or diagnosis of SARS-CoV-2 by FDA under an Emergency Use Authorization (EUA). This EUA will remain in effect (meaning this test can be used) for the duration of the COVID-19 declaration under Section 564(b)(1) of the Act, 21 U.S.C. section 360bbb-3(b)(1), unless the authorization is terminated or revoked.  Performed at [facility], [address], [place], [place] [address]      Radiology Studies: DG CHEST PORT 1 VIEW Result Date: 10/08/2023 CLINICAL DATA:  Hypoxemia. EXAM: PORTABLE CHEST 1 VIEW COMPARISON:  10/06/2023 FINDINGS: Right-sided dialysis catheter remains in place. Increasing right pleural effusion, tracking laterally. Associated opacity in the right hemithorax may represent compressive atelectasis. Subsegmental atelectasis at the left lung base. No visible pneumothorax. No pulmonary edema. Left-sided pacemaker remains in place. IMPRESSION: Increasing right pleural effusion, tracking laterally. Associated opacity in the right hemithorax may represent compressive atelectasis. Electronically Signed   By: Andrea Gasman M.D.   On: 10/08/2023 10:20     Nilda Fendt, MD, PhD [facility]  Between 7 am - 7 pm I am available, please contact me via Amion (for emergencies) or Securechat (non urgent messages)  Between 7 pm - 7 am I am not available, please contact night coverage MD/APP via Amion

## 2023-10-08 NOTE — Plan of Care (Signed)
  Problem: Skin Integrity: Goal: Risk for impaired skin integrity will decrease Outcome: Progressing   Problem: Tissue Perfusion: Goal: Adequacy of tissue perfusion will improve Outcome: Progressing   Problem: Clinical Measurements: Goal: Will remain free from infection Outcome: Progressing

## 2023-10-08 NOTE — Progress Notes (Signed)
  Echocardiogram 2D Echocardiogram has been performed.  Anthony Bullock 10/08/2023, 11:25 AM

## 2023-10-08 NOTE — Progress Notes (Signed)
 Patient ID: Anthony Bullock, male   DOB: 1960/02/02, 63 y.o.   MRN: [id number] [facility] Progress Note   Assessment/ Plan:   1.  Right-sided pleural effusion: Manifesting with acute worsening of shortness of breath and right-sided chest pain.  Status post thoracentesis on 9/26 with sample unfortunately not sent for analysis.  Chest x-ray shows improved effusion but continued right hemithorax opacity-possibly needs a repeat thoracentesis with sample analysis. 2.  End-stage renal disease: Usually on TTS dialysis schedule and underwent hemodialysis yesterday via right IJ TDC.  Ultrafiltration limited by chronic hypotension. 3.  Hypertension: Blood pressures borderline, continue to monitor on midodrine . 4.  Hyponatremia: Mild, monitor with hemodialysis/ultrafiltration on subsequent labs.  Restrict oral fluids to 1.2 L/day. 5.  Secondary hyperparathyroidism: Calcium  level elevated when corrected for hypoalbuminemia and phosphorus levels are at goal.  Continue to monitor off of binders at this time. 6.  Nutrition: Continue renal diet with ongoing protein supplementation.  Subjective:   Reports some improvement of right-sided chest pain but continues to have intermittent difficulty with shortness of breath.   Objective:   BP (!) 101/51 (BP Location: Right Arm)   Pulse 80   Temp (!) 97.5 F (36.4 C)   Resp 18   Ht 5' 10 (1.778 m)   Wt 84.4 kg   SpO2 90%   BMI 26.70 kg/m   Physical Exam: Gen: Comfortably resting in bed, wife at bedside assisting with oral care CVS: Pulse regular rhythm, normal rate, S1 and S2 normal.  Right IJ TDC in place Resp: Decreased breath sounds right base otherwise clear to auscultation.  No wheeze/rhonchi Abd: Soft, flat, nontender, bowel sounds normal Ext: Status post bilateral BKA  Labs: BMET Recent Labs  Lab 10/06/23 1400 10/07/23 0523 10/08/23 0244  NA 133* 133* 133*  K 3.4* 3.6 3.6  CL 91* 96* 96*  CO2 25 24 24   GLUCOSE 237* 154* 170*   BUN 30* 42* 30*  CREATININE 8.08* 9.42* 7.66*  CALCIUM  9.5 9.0 8.6*  PHOS  --  4.1 3.3   CBC Recent Labs  Lab 10/06/23 1359 10/07/23 0523  WBC 9.9 10.1  NEUTROABS 7.0  --   HGB 13.6 13.1  HCT 42.3 39.9  MCV 88.3 87.9  PLT 240 229     Medications:     amiodarone   200 mg Oral Daily   apixaban   5 mg Oral BID   atorvastatin   80 mg Oral QHS   carvedilol   6.25 mg Oral BID   Chlorhexidine  Gluconate Cloth  6 each Topical Q0600   clopidogrel   75 mg Oral Q breakfast   feeding supplement (NEPRO CARB STEADY)  237 mL Oral BID BM   insulin  aspart  0-9 Units Subcutaneous TID WC   insulin  glargine  16 Units Subcutaneous QHS   midodrine   10 mg Oral Q T,Th,Sa-HD   multivitamin  1 tablet Oral Daily   sodium chloride  flush  3 mL Intravenous Q12H   Gordy Blanch, MD 10/08/2023, 10:01 AM

## 2023-10-09 DIAGNOSIS — D631 Anemia in chronic kidney disease: Secondary | ICD-10-CM | POA: Diagnosis not present

## 2023-10-09 DIAGNOSIS — N186 End stage renal disease: Secondary | ICD-10-CM | POA: Diagnosis not present

## 2023-10-09 DIAGNOSIS — Z992 Dependence on renal dialysis: Secondary | ICD-10-CM | POA: Diagnosis not present

## 2023-10-09 DIAGNOSIS — N2581 Secondary hyperparathyroidism of renal origin: Secondary | ICD-10-CM | POA: Diagnosis not present

## 2023-10-09 DIAGNOSIS — E871 Hypo-osmolality and hyponatremia: Secondary | ICD-10-CM | POA: Diagnosis not present

## 2023-10-09 DIAGNOSIS — J90 Pleural effusion, not elsewhere classified: Secondary | ICD-10-CM | POA: Diagnosis not present

## 2023-10-09 DIAGNOSIS — I120 Hypertensive chronic kidney disease with stage 5 chronic kidney disease or end stage renal disease: Secondary | ICD-10-CM | POA: Diagnosis not present

## 2023-10-09 LAB — RENAL FUNCTION PANEL
Albumin: 2.2 g/dL — ABNORMAL LOW (ref 3.5–5.0)
Anion gap: 15 (ref 5–15)
BUN: 47 mg/dL — ABNORMAL HIGH (ref 8–23)
CO2: 22 mmol/L (ref 22–32)
Calcium: 8.4 mg/dL — ABNORMAL LOW (ref 8.9–10.3)
Chloride: 95 mmol/L — ABNORMAL LOW (ref 98–111)
Creatinine, Ser: 10.09 mg/dL — ABNORMAL HIGH (ref 0.61–1.24)
GFR, Estimated: 5 mL/min — ABNORMAL LOW (ref >60)
Glucose, Bld: 212 mg/dL — ABNORMAL HIGH (ref 70–99)
Phosphorus: 3.9 mg/dL (ref 2.5–4.6)
Potassium: 3.6 mmol/L (ref 3.5–5.1)
Sodium: 132 mmol/L — ABNORMAL LOW (ref 135–145)

## 2023-10-09 LAB — GLUCOSE, CAPILLARY
Glucose-Capillary: 155 mg/dL — ABNORMAL HIGH (ref 70–99)
Glucose-Capillary: 185 mg/dL — ABNORMAL HIGH (ref 70–99)
Glucose-Capillary: 205 mg/dL — ABNORMAL HIGH (ref 70–99)
Glucose-Capillary: 159 mg/dL — ABNORMAL HIGH (ref 70–99)

## 2023-10-09 MED ORDER — MIDODRINE HCL 5 MG PO TABS
10.0000 mg | ORAL_TABLET | ORAL | Status: AC
  Administered 2023-10-09: 10 mg via ORAL
  Filled 2023-10-09: qty 2

## 2023-10-09 MED ORDER — SODIUM CHLORIDE 0.9 % IV BOLUS: 250.0000 mL | INTRAVENOUS | Status: DC

## 2023-10-09 NOTE — Progress Notes (Signed)
 Pt receives out-pt HD at [facility] on TTS 5:45 am chair time. Will assist as needed.   Randine Mungo Dialysis Navigator [phone]

## 2023-10-09 NOTE — TOC Initial Note (Signed)
 Transition of Care (TOC) - Initial/Assessment Note   Spoke to patient and wife at bedside.   Patient from home with wife.   PCP Wolm Scarlet  Patient has canes, walkers, bedside commode, shower chair and wheelchair at home.   Patient currently not active with any home health. Had home health many years ago after knee replacement    NCM will continue to follow for discharge needs.   Patient Details  Name: Anthony Bullock MRN: [id number] Date of Birth: 01-16-1960  Transition of Care [facility]) CM/SW Contact:    Stephane Powell Jansky, RN Phone Number: 10/09/2023, 9:27 AM  Clinical Narrative:                   Expected Discharge Plan: Home/Self Care Barriers to Discharge: Continued Medical Work up   Patient Goals and CMS Choice Patient states their goals for this hospitalization and ongoing recovery are:: to return to home   Choice offered to / list presented to : NA      Expected Discharge Plan and Services   Discharge Planning Services: CM Consult Post Acute Care Choice: NA Living arrangements for the past 2 months: Single Family Home                 DME Arranged: N/A         HH Arranged: NA HH Agency: NA        Prior Living Arrangements/Services Living arrangements for the past 2 months: Single Family Home Lives with:: Spouse Patient language and need for interpreter reviewed:: Yes Do you feel safe going back to the place where you live?: Yes      Need for Family Participation in Patient Care: Yes (Comment) Care giver support system in place?: Yes (comment) Current home services: DME Criminal Activity/Legal Involvement Pertinent to Current Situation/Hospitalization: No - Comment as needed  Activities of Daily Living      Permission Sought/Granted   Permission granted to share information with : Yes, Verbal Permission Granted  Share Information with NAME: spouse Devere           Emotional Assessment Appearance:: Appears stated  age Attitude/Demeanor/Rapport: Engaged Affect (typically observed): Appropriate Orientation: : Oriented to Self, Oriented to Place, Oriented to  Time, Oriented to Situation Alcohol  / Substance Use: Not Applicable Psych Involvement: No (comment)  Admission diagnosis:  Pleural effusion [J90] Hypoxemia [R09.02] Patient Active Problem List   Diagnosis Date Noted   Hypoxemia 10/07/2023   Pleural effusion 10/06/2023   Acute respiratory distress 10/06/2023   S/P primary angioplasty with coronary stent 04/29/2023   PAD (peripheral artery disease) 04/29/2023   Duodenal ulcer disease 04/28/2023   C. difficile colitis 04/28/2023   Acute blood loss anemia 04/27/2023   UGI bleed 04/27/2023   Hypotension 04/27/2023   Metabolic acidosis, increased anion gap 04/27/2023   Elevated troponin 04/27/2023   Hyponatremia 04/27/2023   NSTEMI (non-ST elevated myocardial infarction) (HCC) 04/11/2023   Paroxysmal atrial fibrillation (HCC) 04/11/2023   Type 2 diabetes mellitus with peripheral neuropathy (HCC) 04/11/2023   Dyslipidemia 04/11/2023   GERD without esophagitis 04/11/2023   End-stage renal disease on hemodialysis (HCC) 04/11/2023   Chest pain 03/07/2023   Volume overload 09/24/2022   Pneumonia due to COVID-19 virus 09/06/2022   Acute on chronic HFrEF (heart failure with reduced ejection fraction) (HCC) 09/06/2022   Atrial fibrillation (HCC) 08/23/2022   Pneumonia due to infectious organism 08/18/2022   Stenosis of intracranial portion of both internal carotid arteries 08/09/2022   Diplopia 08/09/2022  Left carotid stenosis 08/09/2022   Malnutrition of moderate degree 11/18/2019   Below-knee amputation of left lower extremity (HCC) 11/16/2019   Abscess of left foot 11/13/2019   Dehiscence of amputation stump (HCC)    History of complete ray amputation of fifth toe of left foot 10/09/2019   Acute pulmonary edema (HCC)    Thrombocytopenia 08/17/2019   Pressure injury of back, stage 2 (HCC)  08/17/2019   Hypertensive emergency 07/10/2019   Onychomycosis 08/16/2016   Osteopathy in diseases classified elsewhere, unspecified site 05/14/2016   S/P bilateral below knee amputation (HCC) 04/26/2016   Bilateral carotid bruits 03/30/2016   Diabetes mellitus with complication (HCC)    Anemia due to chronic kidney disease    ESRD on dialysis (HCC) 11/30/2015   Acute respiratory failure with hypoxia (HCC) 11/29/2015   Dependence on renal dialysis 08/15/2015   Presence of automatic (implantable) cardiac defibrillator 03/12/2015   At high risk for falls 09/29/2014   Peripheral vascular disease 09/29/2014   Osteoarthritis of both knees 01/07/2014   Osteoarthritis of multiple joints 10/06/2013   NICM (nonischemic cardiomyopathy) (HCC) 06/27/2013   CAD (coronary artery disease) 03/13/2013   Abnormal nuclear stress test 01/27/2013   Chronic systolic congestive heart failure (HCC) 01/27/2013   COPD (chronic obstructive pulmonary disease) (HCC) 12/27/2012   Diabetic peripheral neuropathy (HCC) 02/10/2012   Urethral stricture 10/06/2010   Adjustment disorder with depressed mood 08/21/2009   Type 2 diabetes mellitus with hyperlipidemia (HCC) 04/14/2008   Hyperlipidemia 04/14/2008   Essential hypertension 04/14/2008   Allergic rhinitis 04/14/2008   PCP:  Micheal Wolm ORN, MD Pharmacy:   [facility] [address], [place] - [address] [place] [address] Phone: [phone] Fax: [phone]  [facility] [address] [place] - [address] [place] [address] Phone: [phone] Fax: [phone]  [facility] [address] [place] [address] Phone: [phone] Fax: [phone]  [facility] - [place], [place] - [address] [place] [address] Phone: [phone] Fax: [phone]     Social Drivers of Health (SDOH) Social History: SDOH  Screenings   Food Insecurity: No Food Insecurity (10/06/2023)  Housing: Low Risk  (10/06/2023)  Transportation Needs: No Transportation Needs (10/06/2023)  Utilities: Not At Risk (10/06/2023)  Alcohol  Screen: Low Risk  (02/03/2023)  Depression (PHQ2-9): Low Risk  (02/03/2023)  Financial Resource Strain: Low Risk  (02/03/2023)  Physical Activity: Inactive (02/03/2023)  Social Connections: Socially Integrated (04/27/2023)  Stress: No Stress Concern Present (02/03/2023)  Tobacco Use: Medium Risk (10/06/2023)  Health Literacy: Adequate Health Literacy (02/03/2023)   SDOH Interventions:     Readmission Risk Interventions    05/01/2023    1:22 PM 04/12/2023    3:14 PM 03/08/2023   10:44 AM  Readmission Risk Prevention Plan  Transportation Screening Complete Complete Complete  PCP or Specialist Appt within 3-5 Days   Complete  HRI or Home Care Consult   Complete  Social Work Consult for Recovery Care Planning/Counseling   Complete  Palliative Care Screening   Not Applicable  Medication Review Oceanographer) Referral to Pharmacy Referral to Pharmacy Complete  PCP or Specialist appointment within 3-5 days of discharge Complete    HRI or Home Care Consult Complete Complete   SW Recovery Care/Counseling Consult Complete Complete   Palliative Care Screening Not Applicable Not Applicable   Skilled Nursing Facility Not Applicable Not  Applicable

## 2023-10-09 NOTE — Consult Note (Signed)
 NAME:  Anthony Bullock, MRN:  [id number], DOB:  Nov 29, 1960, LOS: 2 ADMISSION DATE:  10/06/2023, CONSULTATION DATE:  10/09/23 REFERRING MD:  Dr. Trixie, CHIEF COMPLAINT:  SOB    History of Present Illness:   85 yoM with PMH significant for COPD, ESRD TTS HD, HTN, HLD, HFrEF EF 40, HFpEF, PAF on Eliquis , CAD on plavix , DMT2, PVD s/p bilateral BKA, anemia, GIB who presented 9/26 with one month hx of SOB and intermittent right sided chest pain radiating into R shoulder.  No cough or fever. CXR performed while at HD 9/25 which showed right pleural effusion, recommended further evaluation and sent to ER.   Former smoker, quit 13 yrs ago.  Has 64 pack yr history.  Pt is not always forth coming with specific information, wife at bedside is helpful.  Pt reports no recent fever or chills, N/V.  Has had sinus congestion with yellow drainage and tint of blood for several days and as of yesterday having intermittent yellowish productive cough.  Does report exertional dyspnea and intermittent pleuritic pain with inspiration and R shoulder pain for at least 4 weeks.  Poor appetite over last week and reports weight loss, ?20lbs but on chart review, is down ~6kg since May.  Also, wife reports pt had fall with resultant right sided chest pain after hospitalization in May for pulmonary edema and April for GIB and s/p PCI to LAD/ RCA 04/12/23; pt did not seek evaluation after fall.  Also had fall last week after getting off lawn mower after prolonged use, denies hitting head. Wife reports it is not uncommon for him to fall due to weakness after HD and ongoing use of BLE prosthetics. On imaging review, pt has had moderate right pleural effusion since CXR in May 2025.  On admit, required 4L [place].  Afebrile and normal WBC/ differential. LFTs normal. Admitted to [facility].  Underwent IR thoracentesis on 9/26 with 1.85L of amber colored pleural fluid however was not sent for studies.  CXR 9/28 showed increasing R pleural effusion, tracking  laterally and underwent IR thoracentesis with of amber fluid which was sent for cell count and cytology.  Pt did report improvement in SOB after thoracentesis's and weaned to room air 9/28.  Chest CT w/ contrast performed after thoracentesis which showed extensive pleural nodularity in right chest, posterior right pleural nodule measures 4x1, with plaque-like nodular pleural thickening inferolaterally extending into right chest wall with pathologic fracture of anterior 5th and destruction of 6/7 ribs suspicious for malignancy, 17mm GGO in LUL, 5mm LLL pulmonary nodule, 13 mm paraesophageal lymph node, and RUL an RML volume loss with collapse/ consolidation in RLL with areas of loculated pleural fluid in right upper hemithorax.  Pleural fluid noted straw colored, LDH 660, neutrophil predominant, glucose 160, protein 5.5.  Pulmonary consulted for further recommendations.   Pertinent  Medical History  Former smoker, COPD, ESRD TTS HD, hypotension on midodrine , HTN, HLD, HFrEF, PAF, CAD, DMT2, peripheral neuropathy, PVD s/p bilateral BKA, anemia, GIB  Significant Hospital Events: Including procedures, antibiotic start and stop dates in addition to other pertinent events   9/26 admit, IR R thora 1.85 ml- no fluid sent 9/28 IR thora 520 ml, pleural studies sent  Interim History / Subjective:   Objective    Blood pressure (!) 112/42, pulse 65, temperature 98.3 F (36.8 C), resp. rate 17, height 5' 10 (1.778 m), weight 84.4 kg, SpO2 91%.        Intake/Output Summary (Last 24 hours) at 10/09/2023  1209 Last data filed at 10/08/2023 2239 Gross per 24 hour  Intake 3 ml  Output --  Net 3 ml   Filed Weights   10/06/23 1343 10/07/23 0917 10/07/23 1230  Weight: 82.1 kg 84.7 kg 84.4 kg   Examination: General:  Adult male lying in bed in NAD HEENT: MM pink/moist Neuro: Appropriate, oriented, MAE CV: rr, NSR, R internal jugular TDC site wnl PULM:  non labored, clear anteriorly, diminished R  base, no wheeze, RA GI: soft, bs+ Extremities: warm/dry, bilateral BKA, no edema  Skin: no rashes    Resolved problem list   Assessment and Plan   Right loculated pleural effusion RLL consolidation vs atelectasis with extensive right pleural nodularity with suspected pathologic right rib anterior rib fx 5/6/ 7, LUL GGO and LLL pleural nodule Former smoker Right effusion dating back to May 2025 after admit for pulmonary edema/ hypervolemia ddx concerning for malignancy, possible infectious vs inflammatory etiology.   - add on pleural GS and culture, and follow pleural cytology - consider TCTS evaluation for biopsy if cytology negative - hold Eliquis , anticipate PCCM placement pigtail CT placement for drainage of loculated effusion> will sent fluid again for cytology - discussed with attending, hold off on empiric abx for now.  Afebrile, WBC normal, trend clinically and check PCT - ongoing pulm hygiene- IS - volume removal per HD, currently look euvolemic    Remainder per [facility], pulmonary will continue to follow.   Labs   CBC: Recent Labs  Lab 10/06/23 1359 10/07/23 0523  WBC 9.9 10.1  NEUTROABS 7.0  --   HGB 13.6 13.1  HCT 42.3 39.9  MCV 88.3 87.9  PLT 240 229    Basic Metabolic Panel: Recent Labs  Lab 10/06/23 1400 10/07/23 0523 10/08/23 0244 10/09/23 0258  NA 133* 133* 133* 132*  K 3.4* 3.6 3.6 3.6  CL 91* 96* 96* 95*  CO2 25 24 24 22   GLUCOSE 237* 154* 170* 212*  BUN 30* 42* 30* 47*  CREATININE 8.08* 9.42* 7.66* 10.09*  CALCIUM  9.5 9.0 8.6* 8.4*  PHOS  --  4.1 3.3 3.9   GFR: Estimated Creatinine Clearance: 7.8 mL/min (A) (by C-G formula based on SCr of 10.09 mg/dL (H)). Recent Labs  Lab 10/06/23 1359 10/07/23 0523  WBC 9.9 10.1    Liver Function Tests: Recent Labs  Lab 10/06/23 1400 10/07/23 0523 10/08/23 0244 10/09/23 0258  AST 24  --   --   --   ALT 17  --   --   --   ALKPHOS 101  --   --   --   BILITOT 0.5  --   --   --   PROT 7.1  --   --    --   ALBUMIN  2.7* 2.4* 2.4* 2.2*   Recent Labs  Lab 10/06/23 1400  LIPASE 25   No results for input(s): AMMONIA in the last 168 hours.  ABG    Component Value Date/Time   PHART 7.33 (L) 03/07/2023 0006   PCO2ART 34 03/07/2023 0006   PO2ART 41 (L) 03/07/2023 0006   HCO3 24.4 05/21/2023 2233   TCO2 25 05/21/2023 2233   ACIDBASEDEF 7.2 (H) 03/07/2023 0006   O2SAT 91 05/21/2023 2233     Coagulation Profile: No results for input(s): INR, PROTIME in the last 168 hours.  Cardiac Enzymes: No results for input(s): CKTOTAL, CKMB, CKMBINDEX, TROPONINI in the last 168 hours.  HbA1C: Hemoglobin A1C  Date/Time Value Ref Range Status  09/20/2023 02:10  PM 6.7 (A) 4.0 - 5.6 % Final  06/19/2023 01:57 PM 5.8 (A) 4.0 - 5.6 % Final  02/15/2018 12:00 AM 11.6  Final    Comment:    [facility] in [place]   Hgb A1c MFr Bld  Date/Time Value Ref Range Status  11/13/2019 04:43 PM 6.5 (H) 4.8 - 5.6 % Final    Comment:    (NOTE) Pre diabetes:          5.7%-6.4%  Diabetes:              >6.4%  Glycemic control for   <7.0% adults with diabetes   07/10/2019 04:44 AM 8.1 (H) 4.8 - 5.6 % Final    Comment:    (NOTE)         Prediabetes: 5.7 - 6.4         Diabetes: >6.4         Glycemic control for adults with diabetes: <7.0     CBG: Recent Labs  Lab 10/08/23 0807 10/08/23 1747 10/08/23 2003 10/09/23 0824 10/09/23 1144  GLUCAP 179* 270* 234* 155* 185*    Review of Systems:   Review of Systems  Constitutional:  Positive for weight loss.  HENT:  Positive for congestion.   Respiratory:  Positive for cough, sputum production and shortness of breath. Negative for hemoptysis and wheezing.   Cardiovascular:  Negative for leg swelling.       Right sided pleuritic like chest pain  Gastrointestinal:  Negative for nausea and vomiting.  Neurological:  Negative for focal weakness and loss of consciousness.   Past Medical History:  He,  has a past medical history of AICD  (automatic cardioverter/defibrillator) present, Allergic rhinitis, Anemia, Arthritis, Chronic systolic heart failure (HCC), COPD (chronic obstructive pulmonary disease) (HCC), Diabetes mellitus type II, Diabetic nephropathy (HCC), Diabetic neuropathy (HCC), ESRD on hemodialysis (HCC), History of cardiac catheterization, History of kidney stones, Hyperlipidemia, Hypertension, Kidney stones, NICM (nonischemic cardiomyopathy) (HCC), Osteomyelitis (HCC), Pneumonia, Urethral stricture, and Wears glasses.   Surgical History:   Past Surgical History:  Procedure Laterality Date   ABDOMINAL AORTOGRAM W/LOWER EXTREMITY N/A 03/30/2016   Procedure: Abdominal Aortogram w/Lower Extremity;  Surgeon: Lonni GORMAN Blade, MD;  Location: [facility] INVASIVE CV LAB;  Service: Cardiovascular;  Laterality: N/A;   AMPUTATION Right 04/26/2016   Procedure: Right Below Knee Amputation;  Surgeon: Jerona Harden GAILS, MD;  Location: [facility] OR;  Service: Orthopedics;  Laterality: Right;   AMPUTATION Left 08/21/2019   Procedure: LEFT FOOT 5TH RAY AMPUTATION;  Surgeon: Harden Jerona GAILS, MD;  Location: [facility] OR;  Service: Orthopedics;  Laterality: Left;   AMPUTATION Left 11/13/2019   Procedure: LEFT BELOW KNEE AMPUTATION;  Surgeon: Harden Jerona GAILS, MD;  Location: [facility] OR;  Service: Orthopedics;  Laterality: Left;   AV FISTULA PLACEMENT Right 09/08/2015   Procedure: INSERTION OF 4-58mm x 45cm  ARTERIOVENOUS (AV) GORE-TEX GRAFT RIGHT UPPER  ARM;  Surgeon: Lonni GORMAN Blade, MD;  Location: [facility] OR;  Service: Vascular;  Laterality: Right;   AV FISTULA PLACEMENT Left 01/14/2016   Procedure: CREATION OF LEFT UPPER ARM ARTERIOVENOUS FISTULA;  Surgeon: Lonni GORMAN Blade, MD;  Location: [facility] OR;  Service: Vascular;  Laterality: Left;   BASCILIC VEIN TRANSPOSITION Right 08/22/2014   Procedure: RIGHT UPPER ARM BASCILIC VEIN TRANSPOSITION;  Surgeon: Lonni GORMAN Blade, MD;  Location: [facility] OR;  Service: Vascular;  Laterality: Right;   BELOW KNEE LEG AMPUTATION Right  04/26/2016   BIOPSY OF SKIN SUBCUTANEOUS TISSUE AND/OR MUCOUS MEMBRANE  04/28/2023  Procedure: BIOPSY, SKIN, SUBCUTANEOUS TISSUE, OR MUCOUS MEMBRANE;  Surgeon: Shila Gustav GAILS, MD;  Location: [facility] ENDOSCOPY;  Service: Gastroenterology;;   CARDIAC CATHETERIZATION     CARDIAC DEFIBRILLATOR PLACEMENT  06/27/2013   Sub Q       BY DR FERNANDE   CATARACT EXTRACTION W/PHACO Right 08/06/2018   Procedure: CATARACT EXTRACTION PHACO AND INTRAOCULAR LENS PLACEMENT (IOC);  Surgeon: Harrie Agent, MD;  Location: [facility] ORS;  Service: Ophthalmology;  Laterality: Right;  CDE: 4.06   CATARACT EXTRACTION W/PHACO Left 08/20/2018   Procedure: CATARACT EXTRACTION PHACO AND INTRAOCULAR LENS PLACEMENT (IOC);  Surgeon: Harrie Agent, MD;  Location: [facility] ORS;  Service: Ophthalmology;  Laterality: Left;  CDE: 6.76   COLONOSCOPY WITH PROPOFOL  N/A 07/22/2015   Procedure: COLONOSCOPY WITH PROPOFOL ;  Surgeon: Victory LITTIE Legrand DOUGLAS, MD;  Location: [facility] ENDOSCOPY;  Service: Gastroenterology;  Laterality: N/A;   CORONARY LITHOTRIPSY N/A 04/14/2023   Procedure: CORONARY LITHOTRIPSY;  Surgeon: Anner Alm ORN, MD;  Location: [facility] INVASIVE CV LAB;  Service: Cardiovascular;  Laterality: N/A;  RCA   CORONARY STENT INTERVENTION N/A 04/12/2023   Procedure: CORONARY STENT INTERVENTION;  Surgeon: Swaziland, Peter M, MD;  Location: [facility] INVASIVE CV LAB;  Service: Cardiovascular;  Laterality: N/A;   CORONARY STENT INTERVENTION N/A 04/14/2023   Procedure: CORONARY STENT INTERVENTION;  Surgeon: Anner Alm ORN, MD;  Location: [facility] INVASIVE CV LAB;  Service: Cardiovascular;  Laterality: N/A;   ESOPHAGOGASTRODUODENOSCOPY N/A 04/28/2023   Procedure: EGD (ESOPHAGOGASTRODUODENOSCOPY);  Surgeon: Nandigam, Kavitha V, MD;  Location: [facility] ENDOSCOPY;  Service: Gastroenterology;  Laterality: N/A;   FEMORAL-POPLITEAL BYPASS GRAFT Right 03/31/2016   Procedure: BYPASS GRAFT FEMORAL-POPLITEAL ARTERY USING RIGHT GREATER SAPHENOUS NONREVERSED VEIN;  Surgeon: Lonni GORMAN Blade, MD;   Location: [facility] OR;  Service: Vascular;  Laterality: Right;   HERNIA REPAIR     I & D EXTREMITY Right 03/31/2016   Procedure: IRRIGATION AND DEBRIDEMENT FOOT;  Surgeon: Lonni GORMAN Blade, MD;  Location: [facility] OR;  Service: Vascular;  Laterality: Right;   IMPLANTABLE CARDIOVERTER DEFIBRILLATOR IMPLANT N/A 06/27/2013   Procedure: SUB Q ICD;  Surgeon: Elspeth JAYSON FERNANDE, MD;  Location: [facility] CATH LAB;  Service: Cardiovascular;  Laterality: N/A;   INTRAOPERATIVE ARTERIOGRAM Right 03/31/2016   Procedure: INTRA OPERATIVE ARTERIOGRAM;  Surgeon: Lonni GORMAN Blade, MD;  Location: [facility] OR;  Service: Vascular;  Laterality: Right;   IR GENERIC HISTORICAL Right 11/30/2015   IR THROMBECTOMY AV FISTULA W/THROMBOLYSIS/PTA INC/SHUNT/IMG RIGHT 11/30/2015 Marcey Moan, MD [facility]   IR GENERIC HISTORICAL  11/30/2015   IR US  GUIDE VASC ACCESS RIGHT 11/30/2015 Marcey Moan, MD [facility]   IR GENERIC HISTORICAL Right 12/15/2015   IR THROMBECTOMY AV FISTULA W/THROMBOLYSIS/PTA/STENT INC/SHUNT/IMG RT 12/15/2015 Toribio Faes, MD [facility]   IR GENERIC HISTORICAL  12/15/2015   IR US  GUIDE VASC ACCESS RIGHT 12/15/2015 Toribio Faes, MD [facility]   IR GENERIC HISTORICAL  12/28/2015   IR FLUORO GUIDE CV LINE RIGHT 12/28/2015 Rome Hall, MD [facility]   IR GENERIC HISTORICAL  12/28/2015   IR US  GUIDE VASC ACCESS RIGHT 12/28/2015 Rome Hall, MD [facility]   LEFT A ND RIGHT HEART CATH  01/30/2013   DR CHET   LEFT AND RIGHT HEART CATHETERIZATION WITH CORONARY ANGIOGRAM N/A 01/30/2013   Procedure: LEFT AND RIGHT HEART CATHETERIZATION WITH CORONARY ANGIOGRAM;  Surgeon: Toribio JONELLE Fuel, MD;  Location: [facility] CATH LAB;  Service: Cardiovascular;  Laterality: N/A;   LEFT HEART CATH AND CORONARY ANGIOGRAPHY N/A 04/12/2023   Procedure: LEFT HEART CATH AND  CORONARY ANGIOGRAPHY;  Surgeon: Swaziland, Peter M, MD;  Location: [facility] INVASIVE CV LAB;  Service: Cardiovascular;  Laterality: N/A;   PERIPHERAL VASCULAR  CATHETERIZATION Right 01/26/2015   Procedure: A/V Fistulagram;  Surgeon: Lonni GORMAN Blade, MD;  Location: [facility] INVASIVE CV LAB;  Service: Cardiovascular;  Laterality: Right;   reapea urethral surgery for recurrent obstruction  2011   TOTAL KNEE ARTHROPLASTY Right 2007   VEIN HARVEST Right 03/31/2016   Procedure: RIGHT GREATER SAPHENOUS VEIN HARVEST;  Surgeon: Lonni GORMAN Blade, MD;  Location: [facility] OR;  Service: Vascular;  Laterality: Right;     Social History:   reports that he quit smoking about 13 years ago. His smoking use included cigarettes. He started smoking about 45 years ago. He has a 64 pack-year smoking history. He has never used smokeless tobacco. He reports that he does not drink alcohol  and does not use drugs.   Family History:  His family history includes Alcohol  abuse in his father; Bladder Cancer in his mother; Diabetes in his maternal grandmother; Heart Problems in his maternal grandmother; Heart disease in his maternal grandfather; Melanoma in his father; Prostate cancer in his maternal grandfather; Stroke in his maternal grandmother.   Allergies Allergies  Allergen Reactions   Epoetin  Alfa Other (See Comments)    Unknown    Ferumoxytol  Other (See Comments)    Unknown    Morphine Sulfate Rash and Other (See Comments)    Itches all over, red spots     Home Medications  Prior to Admission medications   Medication Sig Start Date End Date Taking? Authorizing Provider  acetaminophen  (TYLENOL ) 325 MG tablet Take 1-2 tablets (325-650 mg total) by mouth every 4 (four) hours as needed for mild pain. 11/29/19  Yes Love, Sharlet GORMAN, PA-C  amiodarone  (PACERONE ) 200 MG tablet TAKE 1 TABLET ( 200 MG ) BY MOUTH TWICE DAILY FOR 20 DAYS, THEN 1 TABLET ( 200 MG TOTAL ) ONCE DAILY Patient taking differently: Take 200 mg by mouth daily. 09/18/23  Yes Burchette, Wolm ORN, MD  apixaban  (ELIQUIS ) 5 MG TABS tablet Take 1 tablet (5 mg total) by mouth 2 (two) times daily. 05/22/23  Yes Ghimire,  Donalda HERO, MD  atorvastatin  (LIPITOR ) 80 MG tablet Take 1 tablet (80 mg total) by mouth at bedtime. 09/20/23  Yes Burchette, Wolm ORN, MD  carvedilol  (COREG ) 6.25 MG tablet Take 1 tablet (6.25 mg total) by mouth 2 (two) times daily. 09/20/23  Yes Burchette, Wolm ORN, MD  cinacalcet  (SENSIPAR ) 30 MG tablet Take 1 tablet (30 mg total) by mouth Every Tuesday,Thursday,and Saturday with dialysis. 11/30/19  Yes Love, Sharlet GORMAN, PA-C  clopidogrel  (PLAVIX ) 75 MG tablet Take 1 tablet (75 mg total) by mouth daily with breakfast. 08/14/23  Yes Burchette, Wolm ORN, MD  gabapentin  (NEURONTIN ) 300 MG capsule Take 1 capsule (300 mg total) by mouth at bedtime. 04/15/23  Yes Hongalgi, Anand D, MD  HUMALOG  KWIKPEN 200 UNIT/ML KwikPen INJECT A MAXIMUM OF 28 UNITS SUBCUTANEOUSLY TWICE DAILY WITH LUNCH AND SUPPER PER SLIDING SCALE. APPOINTMENT REQUIRED FOR FUTURE REFILLS 08/16/23  Yes Burchette, Wolm ORN, MD  lidocaine  (LIDODERM ) 5 % Place 1 patch onto the skin daily as needed (pain). Remove & Discard patch within 12 hours or as directed by MD   Yes [provider]  midodrine  (PROAMATINE ) 10 MG tablet Take 1 tablet (10 mg total) by mouth every 8 (eight) hours. Patient taking differently: Take 10 mg by mouth every 8 (eight) hours. Takes once a day on dialysis  days ONLY 09/20/23  Yes Burchette, Wolm ORN, MD  multivitamin (RENA-VIT) TABS tablet Take 1 tablet by mouth once daily 07/06/21  Yes Burchette, Wolm ORN, MD  Olopatadine  HCl 0.2 % SOLN Place 1 drop into both eyes daily as needed (for allergies).    Yes [provider]  VENTOLIN  HFA 108 (90 Base) MCG/ACT inhaler INHALE 1 TO 2 PUFFS BY MOUTH EVERY 6 HOURS AS NEEDED FOR WHEEZING FOR SHORTNESS OF BREATH 05/05/23  Yes Burchette, Wolm ORN, MD  Continuous Glucose Receiver (FREESTYLE LIBRE 3 READER) DEVI Use to check blood glucose TID 08/11/22   Burchette, Wolm ORN, MD  Continuous Glucose Sensor (FREESTYLE LIBRE 3 PLUS SENSOR) MISC USE AS DIRECTED TO CHECK GLUCOSE DAILY. CHANGE  EVERY 15 DAYS 05/24/23   Burchette, Wolm ORN, MD  Elastic Bandages & Supports (ABDOMINAL BINDER/ELASTIC MED) MISC 1 kit by Does not apply route daily. 06/09/23   Miriam Norris, NP  glucose blood test strip Check 1 time daily. E11.9 One Touch Ultra Blue Test Strips 03/10/14   Burchette, Wolm ORN, MD  Insulin  Pen Needle (BD PEN NEEDLE NANO U/F) 32G X 4 MM MISC USE 1 PEN NEEDLE SUBCUTANEOUSLY WITH INSULIN  4 TIMES DAILY 12/04/19   Burchette, Wolm ORN, MD  nitroGLYCERIN  (NITROSTAT ) 0.4 MG SL tablet Place 1 tablet (0.4 mg total) under the tongue every 5 (five) minutes as needed for chest pain. 04/21/23   Burchette, Wolm ORN, MD     Critical care time:      Lyle Pesa, NP [facility] 10/09/2023, 3:50 PM  See Amion for pager If no response to pager , please call [phone] until 7pm After 7:00 pm call Elink  [phone]

## 2023-10-09 NOTE — Progress Notes (Signed)
  [facility] Progress Note   Subjective:  Seen in room - still with a little CP/dyspnea, better than prior. Awaiting pulm consult, CT with concerning for lung mass/malignancy with pathological rib Fx.  Objective Vitals:   10/08/23 1751 10/08/23 2002 10/09/23 0419 10/09/23 0824  BP: (!) 128/47 (!) 116/49 (!) 106/47 (!) 112/42  Pulse: 67 64 66 65  Resp: 16   17  Temp: 98.6 F (37 C) 98 F (36.7 C) 98.3 F (36.8 C) 98.3 F (36.8 C)  TempSrc:  Oral Oral   SpO2: 91% 94% 90% 91%  Weight:      Height:       Physical Exam General: Well appearing man, NAD. Room air. Heart: RRR Lungs: Clear in upper lobes, dull B bases (R>L) Abdomen: soft Extremities: no LE edema, B BKA Dialysis Access: TDC in R chest  Additional Objective Labs: Basic Metabolic Panel: Recent Labs  Lab 10/07/23 0523 10/08/23 0244 10/09/23 0258  NA 133* 133* 132*  K 3.6 3.6 3.6  CL 96* 96* 95*  CO2 24 24 22   GLUCOSE 154* 170* 212*  BUN 42* 30* 47*  CREATININE 9.42* 7.66* 10.09*  CALCIUM  9.0 8.6* 8.4*  PHOS 4.1 3.3 3.9   Liver Function Tests: Recent Labs  Lab 10/06/23 1400 10/07/23 0523 10/08/23 0244 10/09/23 0258  AST 24  --   --   --   ALT 17  --   --   --   ALKPHOS 101  --   --   --   BILITOT 0.5  --   --   --   PROT 7.1  --   --   --   ALBUMIN  2.7* 2.4* 2.4* 2.2*   Recent Labs  Lab 10/06/23 1400  LIPASE 25   CBC: Recent Labs  Lab 10/06/23 1359 10/07/23 0523  WBC 9.9 10.1  NEUTROABS 7.0  --   HGB 13.6 13.1  HCT 42.3 39.9  MCV 88.3 87.9  PLT 240 229   Studies/Results: CT CHEST W CONTRAST Result Date: 10/09/2023 CLINICAL DATA:  Possible pulmonary mass on recent chest x-ray EXAM: CT CHEST WITH CONTRAST TECHNIQUE: Multidetector CT imaging of the chest was performed during intravenous contrast administration. RADIATION DOSE REDUCTION: This exam was performed according to the departmental dose-optimization program which includes automated exposure control, adjustment of  the mA and/or kV according to patient size and/or use of iterative reconstruction technique. CONTRAST:  75mL OMNIPAQUE  IOHEXOL  350 MG/ML SOLN COMPARISON:  Chest x-ray earlier same day FINDINGS: Cardiovascular: The heart size is normal. No substantial pericardial effusion. Coronary artery calcification is evident. Moderate atherosclerotic calcification is noted in the wall of the thoracic aorta. Right IJ central line tip is positioned at the SVC/RA junction. Extracardiac subcutaneous implantable defibrillator evident. Mediastinum/Nodes: 13 mm short axis paraesophageal lymph node seen anterior to the spine on 68/3. Upper normal right hilar lymph nodes. No left hilar lymphadenopathy. Juxta diaphragmatic ingested cardiac lymphadenopathy is seen in the anterior right chest there is no axillary lymphadenopathy. The esophagus has normal imaging features. Lungs/Pleura: Centrilobular and paraseptal emphysema evident. 17 mm ground-glass opacity identified anterior left upper lobe on 39/4. 5 mm left lower lobe nodule seen on 119/4. Subsegmental atelectasis noted left base. Volume loss noted right upper and middle lobes with more pronounced collapse/consolidation in the right lower lobe. Areas of loculated pleural fluid are seen in the upper right hemithorax. Extensive pleural nodularity is seen in the right chest including plaque-like nodular pleural thickening inferolaterally on 95/3. Posterior right pleural  nodule measures 4.0 x 1.0 cm on 128/3. Confluent soft tissue is seen in the anterior and posterior pleural reflections. The anterolateral pleural disease extends into the right chest wall (see image 101/3) with pathologic fracture of the anterior right fifth rib and destruction of the anterior right sixth and seventh ribs. Upper Abdomen: Visualized portion of the upper abdomen shows no acute findings. Musculoskeletal: See above for bony changes in anterior right fifth through seventh ribs. Otherwise no suspicious lytic or  sclerotic osseous abnormality. IMPRESSION: 1. Extensive pleural nodularity in the right chest including plaque-like nodular pleural thickening inferolaterally. The anterolateral pleural disease extends into the right chest wall with pathologic fracture of the anterior right fifth rib and destruction of the anterior right sixth and seventh ribs. Imaging features are highly suspicious for malignancy. 2. 17 mm ground-glass opacity anterior left upper lobe. This is nonspecific and may be infectious/inflammatory. 3. 5 mm left lower lobe pulmonary nodule. 4. 13 mm short axis paraesophageal lymph node anterior to the spine. Metastatic disease a concern. 5. Aortic Atherosclerosis (ICD10-I70.0) and Emphysema (ICD10-J43.9). Electronically Signed   By: Camellia Candle M.D.   On: 10/09/2023 06:58   US  THORACENTESIS ASP PLEURAL SPACE W/IMG GUIDE Result Date: 10/08/2023 INDICATION: 36304 Hypoxemia 36304 History of ESRD, CHF. Currently in hospital with hypoxemia due to right pleural effusion. Had thoracentesis 2 days ago with 1.85 L removed. Found to have recurrent increasing right pleural effusion on chest x-ray today. Consulted for repeat right thoracentesis. EXAM: ULTRASOUND GUIDED RIGHT THORACENTESIS MEDICATIONS: 10 mL 1% lidocaine  COMPLICATIONS: None immediate. PROCEDURE: An ultrasound guided thoracentesis was thoroughly discussed with the patient and questions answered. The benefits, risks, alternatives and complications were also discussed. The patient understands and wishes to proceed with the procedure. Written consent was obtained. Ultrasound was performed to localize and mark an adequate pocket of fluid in the RIGHT chest. The area was then prepped and draped in the normal sterile fashion. 1% Lidocaine  was used for local anesthesia. Under ultrasound guidance a 6 Fr Safe-T-Centesis catheter was introduced. Thoracentesis was performed. The catheter was removed and a dressing applied. FINDINGS: A total of approximately  520 mL of amber fluid was removed. Samples were sent to the laboratory as requested by the clinical team. IMPRESSION: Successful ultrasound guided RIGHT thoracentesis yielding 520 mL of pleural fluid. Performed and dictated by Kimble Clas, PA-C under direct supervision of Thom Hall, MD Electronically Signed   By: Thom Hall M.D.   On: 10/08/2023 14:20   DG Chest 1 View Result Date: 10/08/2023 CLINICAL DATA:  [id number] S/P thoracentesis [id number] EXAM: CHEST  1 VIEW COMPARISON:  October 08, 2023 FINDINGS: The cardiomediastinal silhouette is unchanged in contour.RIGHT chest CVC with poor visualization of the tip of the CVC secondary to underpenetration. This favored to project over the RIGHT atrium. LEFT chest AICD. Moderate RIGHT-sided pleural effusion, decreased from prior. No significant pneumothorax. There is a persistent rounded opacification along the RIGHT lateral lung base. IMPRESSION: 1. Moderate RIGHT-sided pleural effusion, decreased from prior. No significant pneumothorax. 2. Persistent rounded opacification along the RIGHT lateral lung base. This could reflect a loculated component of the pleural effusion versus underlying mass. Recommend correlation with thoracocentesis fluid results with consideration of dedicated CT chest with contrast if clinically indicated. Electronically Signed   By: Corean Salter M.D.   On: 10/08/2023 13:07   ECHOCARDIOGRAM COMPLETE Result Date: 10/08/2023    ECHOCARDIOGRAM REPORT   Patient Name:   WEN MERCED Date of Exam: 10/08/2023 Medical Rec #:  [id number]     Height:       70.0 in Accession #:    [id number]    Weight:       186.1 lb Date of Birth:  1960/02/19     BSA:          2.024 m Patient Age:    63 years      BP:           101/51 mmHg Patient Gender: M             HR:           73 bpm. Exam Location:  Inpatient Procedure: 2D Echo, Cardiac Doppler and Color Doppler (Both Spectral and Color            Flow Doppler were utilized during procedure). Indications:     CHF - Acute Diastolic I50.31  History:        Patient has prior history of Echocardiogram examinations, most                 recent 04/11/2023. CHF and Cardiomyopathy, CAD and Previous                 Myocardial Infarction, ESRD, PAD and COPD, Arrythmias:Atrial                 Fibrillation, Signs/Symptoms:Hypotension and Chest Pain; Risk                 Factors:Hypertension, Dyslipidemia and Diabetes.  Sonographer:    Thea Norlander RCS Referring Phys: COSTIN M GHERGHE IMPRESSIONS  1. Left ventricular ejection fraction, by estimation, is 30 to 35%. The left ventricle has moderately decreased function. The left ventricle demonstrates global hypokinesis. Left ventricular diastolic parameters are consistent with Grade I diastolic dysfunction (impaired relaxation).  2. Right ventricular systolic function is normal. The right ventricular size is normal.  3. The mitral valve is normal in structure. No evidence of mitral valve regurgitation. No evidence of mitral stenosis.  4. The aortic valve is calcified. Aortic valve regurgitation is not visualized. Aortic valve sclerosis is present, with no evidence of aortic valve stenosis.  5. The inferior vena cava is normal in size with greater than 50% respiratory variability, suggesting right atrial pressure of 3 mmHg. FINDINGS  Left Ventricle: Left ventricular ejection fraction, by estimation, is 30 to 35%. The left ventricle has moderately decreased function. The left ventricle demonstrates global hypokinesis. The left ventricular internal cavity size was normal in size. There is no left ventricular hypertrophy. Left ventricular diastolic parameters are consistent with Grade I diastolic dysfunction (impaired relaxation).  LV Wall Scoring: The posterior wall is akinetic. Right Ventricle: The right ventricular size is normal. No increase in right ventricular wall thickness. Right ventricular systolic function is normal. Left Atrium: Left atrial size was normal in size. Right  Atrium: Right atrial size was normal in size. Pericardium: There is no evidence of pericardial effusion. Mitral Valve: The mitral valve is normal in structure. No evidence of mitral valve regurgitation. No evidence of mitral valve stenosis. Tricuspid Valve: The tricuspid valve is normal in structure. Tricuspid valve regurgitation is not demonstrated. No evidence of tricuspid stenosis. Aortic Valve: The aortic valve is calcified. Aortic valve regurgitation is not visualized. Aortic valve sclerosis is present, with no evidence of aortic valve stenosis. Aortic valve peak gradient measures 2.7 mmHg. Pulmonic Valve: The pulmonic valve was normal in structure. Pulmonic valve regurgitation is not visualized. No evidence of pulmonic stenosis. Aorta: The aortic root is  normal in size and structure. Venous: The inferior vena cava is normal in size with greater than 50% respiratory variability, suggesting right atrial pressure of 3 mmHg. IAS/Shunts: No atrial level shunt detected by color flow Doppler.  LEFT VENTRICLE PLAX 2D LVIDd:         5.40 cm   Diastology LVIDs:         4.30 cm   LV e' medial:    4.46 cm/s LV PW:         0.90 cm   LV E/e' medial:  8.9 LV IVS:        1.00 cm   LV e' lateral:   5.22 cm/s LVOT diam:     2.20 cm   LV E/e' lateral: 7.6 LV SV:         50 LV SV Index:   25 LVOT Area:     3.80 cm  RIGHT VENTRICLE            IVC RV S prime:     9.57 cm/s  IVC diam: 1.20 cm TAPSE (M-mode): 1.9 cm LEFT ATRIUM           Index        RIGHT ATRIUM          Index LA diam:      3.20 cm 1.58 cm/m   RA Area:     8.68 cm LA Vol (A2C): 26.9 ml 13.29 ml/m  RA Volume:   13.70 ml 6.77 ml/m LA Vol (A4C): 22.1 ml 10.92 ml/m  AORTIC VALVE AV Area (Vmax): 3.13 cm AV Vmax:        82.00 cm/s AV Peak Grad:   2.7 mmHg LVOT Vmax:      67.60 cm/s LVOT Vmean:     46.100 cm/s LVOT VTI:       0.132 m  AORTA Ao Root diam: 3.50 cm Ao Asc diam:  2.90 cm MITRAL VALVE MV Area (PHT): 4.06 cm    SHUNTS MV Decel Time: 187 msec    Systemic  VTI:  0.13 m MV E velocity: 39.80 cm/s  Systemic Diam: 2.20 cm MV A velocity: 97.50 cm/s MV E/A ratio:  0.41 Oneil Parchment MD Electronically signed by Oneil Parchment MD Signature Date/Time: 10/08/2023/12:32:34 PM    Final    DG CHEST PORT 1 VIEW Result Date: 10/08/2023 CLINICAL DATA:  Hypoxemia. EXAM: PORTABLE CHEST 1 VIEW COMPARISON:  10/06/2023 FINDINGS: Right-sided dialysis catheter remains in place. Increasing right pleural effusion, tracking laterally. Associated opacity in the right hemithorax may represent compressive atelectasis. Subsegmental atelectasis at the left lung base. No visible pneumothorax. No pulmonary edema. Left-sided pacemaker remains in place. IMPRESSION: Increasing right pleural effusion, tracking laterally. Associated opacity in the right hemithorax may represent compressive atelectasis. Electronically Signed   By: Andrea Gasman M.D.   On: 10/08/2023 10:20   Medications:   amiodarone   200 mg Oral Daily   apixaban   5 mg Oral BID   atorvastatin   80 mg Oral QHS   carvedilol   6.25 mg Oral BID   Chlorhexidine  Gluconate Cloth  6 each Topical Q0600   clopidogrel   75 mg Oral Q breakfast   feeding supplement (NEPRO CARB STEADY)  237 mL Oral BID BM   insulin  aspart  0-9 Units Subcutaneous TID WC   insulin  glargine  16 Units Subcutaneous QHS   midodrine   10 mg Oral Q T,Th,Sa-HD   multivitamin  1 tablet Oral Daily   sodium chloride  flush  3 mL Intravenous Q12H  Dialysis Orders TTS - [facility] 4 hours, 400/500, EDW 82.5kg, 1K/2.5Ca, TDC,  no heparin  - Calcitriol  0.75mcg PO q HD - Cinacalcet  90mg  PO q HD  Assessment/Plan: New R pleural effusion: S/p 1.8L thoracentesis on 9/26, unfortunately fluid not sent for Cx. Repeat thora 9/28 with ~561mL out. C/w exudative effusion. CXR after showed possible R lung mass, followed by CT showing extensive pleural nodularity with extension into anterior ribs with pathological Fx, c/w malignancy. Pulm consult pending, anticipate  biopsy. ESRD: Continue HD on TTS schedule for now - next HD tomorrow. On very low K bath as outpatient, but with low sided K here - follow. HTN/volume: BP stable, uses mido pre-HD. No LE edema. Anemia of ESRD: Hgb 13, no ESA needed - contraindicated at this time in setting of presumed cancer Dx. Secondary HPTH: CorrCa/Phos ok - not getting binders here - follow for now. Nutrition: Alb low, continue supplements. A-fib T2DM   Izetta Boehringer, DEVONNA 10/09/2023, 12:49 PM  [facility]

## 2023-10-09 NOTE — Plan of Care (Signed)
  Problem: Education: Goal: Ability to describe self-care measures that may prevent or decrease complications (Diabetes Survival Skills Education) will improve Outcome: Progressing   Problem: Coping: Goal: Ability to adjust to condition or change in health will improve Outcome: Progressing

## 2023-10-09 NOTE — Plan of Care (Signed)
 Poor appetite, MD aware.  Problem: Education: Goal: Ability to describe self-care measures that may prevent or decrease complications (Diabetes Survival Skills Education) will improve Outcome: Progressing Goal: Individualized Educational Video(s) Outcome: Progressing   Problem: Coping: Goal: Ability to adjust to condition or change in health will improve Outcome: Progressing   Problem: Fluid Volume: Goal: Ability to maintain a balanced intake and output will improve Outcome: Progressing   Problem: Health Behavior/Discharge Planning: Goal: Ability to identify and utilize available resources and services will improve Outcome: Progressing Goal: Ability to manage health-related needs will improve Outcome: Progressing

## 2023-10-09 NOTE — Progress Notes (Signed)
 PROGRESS NOTE  [name] FMW:[id number] DOB: 03-07-1960 DOA: 10/06/2023 PCP: Anthony Wolm LELON, MD   LOS: 2 days   Brief Narrative / Interim history: Anthony Bullock is a 63 y.o. male with medical history significant of HTN, HLD, chronic systolic CHF, paroxysmal atrial fibrillation, CAD ESRD on HD (TTS), DM type II, COPD, PVD s/p bilateral BKA, chronic anemia, and GI bleed who presents with shortness of breath and right-sided chest pain. The patient experiences shortness of breath and right-sided chest pain radiating to the right shoulder. They underwent dialysis the previous day, during which x-rays were ordered.  Imaging on admission showed large right-sided pleural effusion and he was admitted to the hospital  Subjective / 24h Interval events: Feels well, no chest pain, no shortness of breath at rest.  Assesement and Plan: Principal problem Hypoxemia due to pleural effusion-came to the hospital with chest discomfort and shortness of breath, pleuritic type chest pain for the past month.  Chest x-ray on admission showed large right-sided pleural effusion, radiology was consulted and he is status post 1.8 L removed.  Unfortunately, no studies were sent for the fluid as this is new onset large right-sided pleural effusion.  On repeat imaging pleural effusion appeared persistent, underwent repeat thoracentesis 9/28 with fluid appearing to be exudative  Active problems Possible lung malignancy -after ultrasound guided thoracentesis x 2, it became apparent that there may be an underlying mass.  Patient underwent a CT scan of the chest on 9/28 which showed plaque-like pleural thickening extending into the right chest wall with pathologic fracture of the anterior right fifth rib, destruction of the anterior right 6th and 7th ribs, highly suspicion for malignancy - CT scan also showed some areas of loculated pleural fluid in the upper right hemithorax.  Pulmonary consulted, appreciate input - Cytology  from 9/28 Thora pending  ESRD-underwent HD yesterday.  Nephrology following. Ultrafiltration limited by chronic hypotension  Chronic hypotension-continue midodrine   CAD-most recent cardiac cath April 2025 status post 2 site PCI of the RCA using score flex angioplasty followed by shockwave lithotripsy.  Continue Plavix  along with Eliquis   COPD, tobacco use in remission-has 30-pack-year smoking history, quit a long time ago, he does not remember whether it was 2001 or 2011.  Has a pulse ox at home, normally sees O2 sats in the low to mid 90s  Chronic systolic CHF-most recent 2D echo done in April 2025 shows LVEF 40%, global hypokinesis.  He has grade 1 diastolic dysfunction, RV systolic function and size were normal - Repeat 2D echo 9/28 shows LVEF 30 to 35%, global hypokinesis, grade 1 diastolic dysfunction.  Given fluid is exudative and there is concern for malignancy this is unlikely to contribute.  Clinically he appears euvolemic, continue home medications  PAF-currently in sinus, continue amiodarone , apixaban , carvedilol   Hyperlipidemia-continue statin  Hyponatremia-mild, fluid balance with hemodialysis  Type 2 diabetes mellitus, with hyperglycemia-continue glargine, sliding scale  Lab Results  Component Value Date   HGBA1C 6.7 (A) 09/20/2023   CBG (last 3)  Recent Labs    10/08/23 1747 10/08/23 2003 10/09/23 0824  GLUCAP 270* 234* 155*    Scheduled Meds:  amiodarone   200 mg Oral Daily   apixaban   5 mg Oral BID   atorvastatin   80 mg Oral QHS   carvedilol   6.25 mg Oral BID   Chlorhexidine  Gluconate Cloth  6 each Topical Q0600   clopidogrel   75 mg Oral Q breakfast   feeding supplement (NEPRO CARB STEADY)  237 mL Oral  BID BM   insulin  aspart  0-9 Units Subcutaneous TID WC   insulin  glargine  16 Units Subcutaneous QHS   midodrine   10 mg Oral Q T,Th,Sa-HD   multivitamin  1 tablet Oral Daily   sodium chloride  flush  3 mL Intravenous Q12H   Continuous Infusions: PRN  Meds:.acetaminophen  **OR** acetaminophen , albuterol , oxyCODONE   Current Outpatient Medications  Medication Instructions   acetaminophen  (TYLENOL ) 325-650 mg, Oral, Every 4 hours PRN   amiodarone  (PACERONE ) 200 MG tablet TAKE 1 TABLET ( 200 MG ) BY MOUTH TWICE DAILY FOR 20 DAYS, THEN 1 TABLET ( 200 MG TOTAL ) ONCE DAILY   apixaban  (ELIQUIS ) 5 mg, Oral, 2 times daily   atorvastatin  (LIPITOR ) 80 mg, Oral, Daily at bedtime   carvedilol  (COREG ) 6.25 mg, Oral, 2 times daily   cinacalcet  (SENSIPAR ) 30 mg, Oral, Every T-Th-Sa (Hemodialysis)   clopidogrel  (PLAVIX ) 75 mg, Oral, Daily with breakfast   Continuous Glucose Receiver (FREESTYLE LIBRE 3 READER) DEVI Use to check blood glucose TID   Continuous Glucose Sensor (FREESTYLE LIBRE 3 PLUS SENSOR) MISC USE AS DIRECTED TO CHECK GLUCOSE DAILY. CHANGE EVERY 15 DAYS   Elastic Bandages & Supports (ABDOMINAL BINDER/ELASTIC MED) MISC 1 kit, Does not apply, Daily   gabapentin  (NEURONTIN ) 300 mg, Oral, Daily at bedtime   glucose blood test strip Check 1 time daily. E11.9 One Touch Ultra Blue Test Strips   HUMALOG  KWIKPEN 200 UNIT/ML KwikPen INJECT A MAXIMUM OF 28 UNITS SUBCUTANEOUSLY TWICE DAILY WITH LUNCH AND SUPPER PER SLIDING SCALE. APPOINTMENT REQUIRED FOR FUTURE REFILLS   Insulin  Pen Needle (BD PEN NEEDLE NANO U/F) 32G X 4 MM MISC USE 1 PEN NEEDLE SUBCUTANEOUSLY WITH INSULIN  4 TIMES DAILY   lidocaine  (LIDODERM ) 5 % 1 patch, Daily PRN   midodrine  (PROAMATINE ) 10 mg, Oral, Every 8 hours   multivitamin (RENA-VIT) TABS tablet Take 1 tablet by mouth once daily   nitroGLYCERIN  (NITROSTAT ) 0.4 mg, Sublingual, Every 5 min PRN   Olopatadine  HCl 0.2 % SOLN 1 drop, Daily PRN   VENTOLIN  HFA 108 (90 Base) MCG/ACT inhaler INHALE 1 TO 2 PUFFS BY MOUTH EVERY 6 HOURS AS NEEDED FOR WHEEZING FOR SHORTNESS OF BREATH    Diet Orders (From admission, onward)     Start     Ordered   10/06/23 1639  Diet renal/carb modified with fluid restriction Diet-HS Snack? Nothing; Fluid  restriction: 1200 mL Fluid; Room service appropriate? Yes; Fluid consistency: Thin  Diet effective now       Question Answer Comment  Diet-HS Snack? Nothing   Fluid restriction: 1200 mL Fluid   Room service appropriate? Yes   Fluid consistency: Thin      10/06/23 1639            DVT prophylaxis:  apixaban  (ELIQUIS ) tablet 5 mg   Lab Results  Component Value Date   PLT 229 10/07/2023      Code Status: Full Code  Family Communication: wife at bedside   Status is: Inpatient Remains inpatient appropriate because: Pulmonary evaluation pending   Level of care: Telemetry Medical  Consultants:  Nephrology  Objective: Vitals:   10/08/23 1751 10/08/23 2002 10/09/23 0419 10/09/23 0824  BP: (!) 128/47 (!) 116/49 (!) 106/47 (!) 112/42  Pulse: 67 64 66 65  Resp: 16   17  Temp: 98.6 F (37 C) 98 F (36.7 C) 98.3 F (36.8 C) 98.3 F (36.8 C)  TempSrc:  Oral Oral   SpO2: 91% 94% 90% 91%  Weight:  Height:        Intake/Output Summary (Last 24 hours) at 10/09/2023 0931 Last data filed at 10/08/2023 2239 Gross per 24 hour  Intake 3 ml  Output --  Net 3 ml   Wt Readings from Last 3 Encounters:  10/07/23 84.4 kg  09/20/23 89.8 kg  09/18/23 89.1 kg    Examination:  Constitutional: NAD Eyes: lids and conjunctivae normal, no scleral icterus ENMT: mmm Neck: normal, supple Respiratory: clear to auscultation bilaterally, diminished at the right lower lung field.  No wheezing Cardiovascular: Regular rate and rhythm, no murmurs / rubs / gallops.  Abdomen: soft, no distention, no tenderness. Bowel sounds positive.  Skin: no rashes   Data Reviewed: I have independently reviewed following labs and imaging studies   CBC Recent Labs  Lab 10/06/23 1359 10/07/23 0523  WBC 9.9 10.1  HGB 13.6 13.1  HCT 42.3 39.9  PLT 240 229  MCV 88.3 87.9  MCH 28.4 28.9  MCHC 32.2 32.8  RDW 14.8 14.7  LYMPHSABS 1.0  --   MONOABS 1.5*  --   EOSABS 0.3  --   BASOSABS 0.1  --      Recent Labs  Lab 10/06/23 1359 10/06/23 1400 10/07/23 0523 10/08/23 0244 10/09/23 0258  NA  --  133* 133* 133* 132*  K  --  3.4* 3.6 3.6 3.6  CL  --  91* 96* 96* 95*  CO2  --  25 24 24 22   GLUCOSE  --  237* 154* 170* 212*  BUN  --  30* 42* 30* 47*  CREATININE  --  8.08* 9.42* 7.66* 10.09*  CALCIUM   --  9.5 9.0 8.6* 8.4*  AST  --  24  --   --   --   ALT  --  17  --   --   --   ALKPHOS  --  101  --   --   --   BILITOT  --  0.5  --   --   --   ALBUMIN   --  2.7* 2.4* 2.4* 2.2*  BNP 526.2*  --   --   --   --     ------------------------------------------------------------------------------------------------------------------ No results for input(s): CHOL, HDL, LDLCALC, TRIG, CHOLHDL, LDLDIRECT in the last 72 hours.  Lab Results  Component Value Date   HGBA1C 6.7 (A) 09/20/2023   ------------------------------------------------------------------------------------------------------------------ No results for input(s): TSH, T4TOTAL, T3FREE, THYROIDAB in the last 72 hours.  Invalid input(s): FREET3  Cardiac Enzymes No results for input(s): CKMB, TROPONINI, MYOGLOBIN in the last 168 hours.  Invalid input(s): CK ------------------------------------------------------------------------------------------------------------------    Component Value Date/Time   BNP 526.2 (H) 10/06/2023 1359    CBG: Recent Labs  Lab 10/07/23 2030 10/08/23 0807 10/08/23 1747 10/08/23 2003 10/09/23 0824  GLUCAP 240* 179* 270* 234* 155*    Recent Results (from the past 240 hours)  Resp panel by RT-PCR (RSV, Flu A&B, Covid) Anterior Nasal Swab     Status: None   Collection Time: 10/06/23  1:59 PM   Specimen: Anterior Nasal Swab  Result Value Ref Range Status   SARS Coronavirus 2 by RT PCR NEGATIVE NEGATIVE Final   Influenza A by PCR NEGATIVE NEGATIVE Final   Influenza B by PCR NEGATIVE NEGATIVE Final    Comment: (NOTE) The Xpert Xpress SARS-CoV-2/FLU/RSV  plus assay is intended as an aid in the diagnosis of influenza from Nasopharyngeal swab specimens and should not be used as a sole basis for treatment. Nasal washings and aspirates are  unacceptable for Xpert Xpress SARS-CoV-2/FLU/RSV testing.  Fact Sheet for Patients: BloggerCourse.com  Fact Sheet for Healthcare Providers: SeriousBroker.it  This test is not yet approved or cleared by the [place]  FDA and has been authorized for detection and/or diagnosis of SARS-CoV-2 by FDA under an Emergency Use Authorization (EUA). This EUA will remain in effect (meaning this test can be used) for the duration of the COVID-19 declaration under Section 564(b)(1) of the Act, 21 U.S.C. section 360bbb-3(b)(1), unless the authorization is terminated or revoked.     Resp Syncytial Virus by PCR NEGATIVE NEGATIVE Final    Comment: (NOTE) Fact Sheet for Patients: BloggerCourse.com  Fact Sheet for Healthcare Providers: SeriousBroker.it  This test is not yet approved or cleared by the [place]  FDA and has been authorized for detection and/or diagnosis of SARS-CoV-2 by FDA under an Emergency Use Authorization (EUA). This EUA will remain in effect (meaning this test can be used) for the duration of the COVID-19 declaration under Section 564(b)(1) of the Act, 21 U.S.C. section 360bbb-3(b)(1), unless the authorization is terminated or revoked.  Performed at [facility], [address] [place], [place] [address]      Radiology Studies: CT CHEST W CONTRAST Result Date: 10/09/2023 CLINICAL DATA:  Possible pulmonary mass on recent chest x-ray EXAM: CT CHEST WITH CONTRAST TECHNIQUE: Multidetector CT imaging of the chest was performed during intravenous contrast administration. RADIATION DOSE REDUCTION: This exam was performed according to the departmental dose-optimization program which  includes automated exposure control, adjustment of the mA and/or kV according to patient size and/or use of iterative reconstruction technique. CONTRAST:  75mL OMNIPAQUE  IOHEXOL  350 MG/ML SOLN COMPARISON:  Chest x-ray earlier same day FINDINGS: Cardiovascular: The heart size is normal. No substantial pericardial effusion. Coronary artery calcification is evident. Moderate atherosclerotic calcification is noted in the wall of the thoracic aorta. Right IJ central line tip is positioned at the SVC/RA junction. Extracardiac subcutaneous implantable defibrillator evident. Mediastinum/Nodes: 13 mm short axis paraesophageal lymph node seen anterior to the spine on 68/3. Upper normal right hilar lymph nodes. No left hilar lymphadenopathy. Juxta diaphragmatic ingested cardiac lymphadenopathy is seen in the anterior right chest there is no axillary lymphadenopathy. The esophagus has normal imaging features. Lungs/Pleura: Centrilobular and paraseptal emphysema evident. 17 mm ground-glass opacity identified anterior left upper lobe on 39/4. 5 mm left lower lobe nodule seen on 119/4. Subsegmental atelectasis noted left base. Volume loss noted right upper and middle lobes with more pronounced collapse/consolidation in the right lower lobe. Areas of loculated pleural fluid are seen in the upper right hemithorax. Extensive pleural nodularity is seen in the right chest including plaque-like nodular pleural thickening inferolaterally on 95/3. Posterior right pleural nodule measures 4.0 x 1.0 cm on 128/3. Confluent soft tissue is seen in the anterior and posterior pleural reflections. The anterolateral pleural disease extends into the right chest wall (see image 101/3) with pathologic fracture of the anterior right fifth rib and destruction of the anterior right sixth and seventh ribs. Upper Abdomen: Visualized portion of the upper abdomen shows no acute findings. Musculoskeletal: See above for bony changes in anterior right fifth  through seventh ribs. Otherwise no suspicious lytic or sclerotic osseous abnormality. IMPRESSION: 1. Extensive pleural nodularity in the right chest including plaque-like nodular pleural thickening inferolaterally. The anterolateral pleural disease extends into the right chest wall with pathologic fracture of the anterior right fifth rib and destruction of the anterior right sixth and seventh ribs. Imaging features are highly suspicious for malignancy. 2.  17 mm ground-glass opacity anterior left upper lobe. This is nonspecific and may be infectious/inflammatory. 3. 5 mm left lower lobe pulmonary nodule. 4. 13 mm short axis paraesophageal lymph node anterior to the spine. Metastatic disease a concern. 5. Aortic Atherosclerosis (ICD10-I70.0) and Emphysema (ICD10-J43.9). Electronically Signed   By: Camellia Candle M.D.   On: 10/09/2023 06:58   US  THORACENTESIS ASP PLEURAL SPACE W/IMG GUIDE Result Date: 10/08/2023 INDICATION: 36304 Hypoxemia 36304 History of ESRD, CHF. Currently in hospital with hypoxemia due to right pleural effusion. Had thoracentesis 2 days ago with 1.85 L removed. Found to have recurrent increasing right pleural effusion on chest x-ray today. Consulted for repeat right thoracentesis. EXAM: ULTRASOUND GUIDED RIGHT THORACENTESIS MEDICATIONS: 10 mL 1% lidocaine  COMPLICATIONS: None immediate. PROCEDURE: An ultrasound guided thoracentesis was thoroughly discussed with the patient and questions answered. The benefits, risks, alternatives and complications were also discussed. The patient understands and wishes to proceed with the procedure. Written consent was obtained. Ultrasound was performed to localize and mark an adequate pocket of fluid in the RIGHT chest. The area was then prepped and draped in the normal sterile fashion. 1% Lidocaine  was used for local anesthesia. Under ultrasound guidance a 6 Fr Safe-T-Centesis catheter was introduced. Thoracentesis was performed. The catheter was removed and a  dressing applied. FINDINGS: A total of approximately 520 mL of amber fluid was removed. Samples were sent to the laboratory as requested by the clinical team. IMPRESSION: Successful ultrasound guided RIGHT thoracentesis yielding 520 mL of pleural fluid. Performed and dictated by Kimble Clas, PA-C under direct supervision of Thom Hall, MD Electronically Signed   By: Thom Hall M.D.   On: 10/08/2023 14:20   DG Chest 1 View Result Date: 10/08/2023 CLINICAL DATA:  [id number] S/P thoracentesis [id number] EXAM: CHEST  1 VIEW COMPARISON:  October 08, 2023 FINDINGS: The cardiomediastinal silhouette is unchanged in contour.RIGHT chest CVC with poor visualization of the tip of the CVC secondary to underpenetration. This favored to project over the RIGHT atrium. LEFT chest AICD. Moderate RIGHT-sided pleural effusion, decreased from prior. No significant pneumothorax. There is a persistent rounded opacification along the RIGHT lateral lung base. IMPRESSION: 1. Moderate RIGHT-sided pleural effusion, decreased from prior. No significant pneumothorax. 2. Persistent rounded opacification along the RIGHT lateral lung base. This could reflect a loculated component of the pleural effusion versus underlying mass. Recommend correlation with thoracocentesis fluid results with consideration of dedicated CT chest with contrast if clinically indicated. Electronically Signed   By: Corean Salter M.D.   On: 10/08/2023 13:07   ECHOCARDIOGRAM COMPLETE Result Date: 10/08/2023    ECHOCARDIOGRAM REPORT   Patient Name:   Anthony Bullock Date of Exam: 10/08/2023 Medical Rec #:  [id number]     Height:       70.0 in Accession #:    [id number]    Weight:       186.1 lb Date of Birth:  12-Jun-1960     BSA:          2.024 m Patient Age:    63 years      BP:           101/51 mmHg Patient Gender: M             HR:           73 bpm. Exam Location:  Inpatient Procedure: 2D Echo, Cardiac Doppler and Color Doppler (Both Spectral and Color            Flow  Doppler were  utilized during procedure). Indications:    CHF - Acute Diastolic I50.31  History:        Patient has prior history of Echocardiogram examinations, most                 recent 04/11/2023. CHF and Cardiomyopathy, CAD and Previous                 Myocardial Infarction, ESRD, PAD and COPD, Arrythmias:Atrial                 Fibrillation, Signs/Symptoms:Hypotension and Chest Pain; Risk                 Factors:Hypertension, Dyslipidemia and Diabetes.  Sonographer:    Thea Norlander RCS Referring Phys: Vicente Weidler M Rosalia Mcavoy IMPRESSIONS  1. Left ventricular ejection fraction, by estimation, is 30 to 35%. The left ventricle has moderately decreased function. The left ventricle demonstrates global hypokinesis. Left ventricular diastolic parameters are consistent with Grade I diastolic dysfunction (impaired relaxation).  2. Right ventricular systolic function is normal. The right ventricular size is normal.  3. The mitral valve is normal in structure. No evidence of mitral valve regurgitation. No evidence of mitral stenosis.  4. The aortic valve is calcified. Aortic valve regurgitation is not visualized. Aortic valve sclerosis is present, with no evidence of aortic valve stenosis.  5. The inferior vena cava is normal in size with greater than 50% respiratory variability, suggesting right atrial pressure of 3 mmHg. FINDINGS  Left Ventricle: Left ventricular ejection fraction, by estimation, is 30 to 35%. The left ventricle has moderately decreased function. The left ventricle demonstrates global hypokinesis. The left ventricular internal cavity size was normal in size. There is no left ventricular hypertrophy. Left ventricular diastolic parameters are consistent with Grade I diastolic dysfunction (impaired relaxation).  LV Wall Scoring: The posterior wall is akinetic. Right Ventricle: The right ventricular size is normal. No increase in right ventricular wall thickness. Right ventricular systolic function is normal.  Left Atrium: Left atrial size was normal in size. Right Atrium: Right atrial size was normal in size. Pericardium: There is no evidence of pericardial effusion. Mitral Valve: The mitral valve is normal in structure. No evidence of mitral valve regurgitation. No evidence of mitral valve stenosis. Tricuspid Valve: The tricuspid valve is normal in structure. Tricuspid valve regurgitation is not demonstrated. No evidence of tricuspid stenosis. Aortic Valve: The aortic valve is calcified. Aortic valve regurgitation is not visualized. Aortic valve sclerosis is present, with no evidence of aortic valve stenosis. Aortic valve peak gradient measures 2.7 mmHg. Pulmonic Valve: The pulmonic valve was normal in structure. Pulmonic valve regurgitation is not visualized. No evidence of pulmonic stenosis. Aorta: The aortic root is normal in size and structure. Venous: The inferior vena cava is normal in size with greater than 50% respiratory variability, suggesting right atrial pressure of 3 mmHg. IAS/Shunts: No atrial level shunt detected by color flow Doppler.  LEFT VENTRICLE PLAX 2D LVIDd:         5.40 cm   Diastology LVIDs:         4.30 cm   LV e' medial:    4.46 cm/s LV PW:         0.90 cm   LV E/e' medial:  8.9 LV IVS:        1.00 cm   LV e' lateral:   5.22 cm/s LVOT diam:     2.20 cm   LV E/e' lateral: 7.6 LV SV:  50 LV SV Index:   25 LVOT Area:     3.80 cm  RIGHT VENTRICLE            IVC RV S prime:     9.57 cm/s  IVC diam: 1.20 cm TAPSE (M-mode): 1.9 cm LEFT ATRIUM           Index        RIGHT ATRIUM          Index LA diam:      3.20 cm 1.58 cm/m   RA Area:     8.68 cm LA Vol (A2C): 26.9 ml 13.29 ml/m  RA Volume:   13.70 ml 6.77 ml/m LA Vol (A4C): 22.1 ml 10.92 ml/m  AORTIC VALVE AV Area (Vmax): 3.13 cm AV Vmax:        82.00 cm/s AV Peak Grad:   2.7 mmHg LVOT Vmax:      67.60 cm/s LVOT Vmean:     46.100 cm/s LVOT VTI:       0.132 m  AORTA Ao Root diam: 3.50 cm Ao Asc diam:  2.90 cm MITRAL VALVE MV Area (PHT):  4.06 cm    SHUNTS MV Decel Time: 187 msec    Systemic VTI:  0.13 m MV E velocity: 39.80 cm/s  Systemic Diam: 2.20 cm MV A velocity: 97.50 cm/s MV E/A ratio:  0.41 Oneil Parchment MD Electronically signed by Oneil Parchment MD Signature Date/Time: 10/08/2023/12:32:34 PM    Final      Nilda Fendt, MD, PhD [facility]  Between 7 am - 7 pm I am available, please contact me via Amion (for emergencies) or Securechat (non urgent messages)  Between 7 pm - 7 am I am not available, please contact night coverage MD/APP via Amion

## 2023-10-10 ENCOUNTER — Inpatient Hospital Stay (HOSPITAL_COMMUNITY)

## 2023-10-10 DIAGNOSIS — Z89511 Acquired absence of right leg below knee: Secondary | ICD-10-CM | POA: Diagnosis not present

## 2023-10-10 DIAGNOSIS — N2581 Secondary hyperparathyroidism of renal origin: Secondary | ICD-10-CM | POA: Diagnosis not present

## 2023-10-10 DIAGNOSIS — C782 Secondary malignant neoplasm of pleura: Secondary | ICD-10-CM | POA: Diagnosis not present

## 2023-10-10 DIAGNOSIS — Z992 Dependence on renal dialysis: Secondary | ICD-10-CM | POA: Diagnosis not present

## 2023-10-10 DIAGNOSIS — I739 Peripheral vascular disease, unspecified: Secondary | ICD-10-CM | POA: Diagnosis not present

## 2023-10-10 DIAGNOSIS — Z9911 Dependence on respirator [ventilator] status: Secondary | ICD-10-CM | POA: Diagnosis not present

## 2023-10-10 DIAGNOSIS — J90 Pleural effusion, not elsewhere classified: Secondary | ICD-10-CM | POA: Diagnosis not present

## 2023-10-10 DIAGNOSIS — Z89512 Acquired absence of left leg below knee: Secondary | ICD-10-CM | POA: Diagnosis not present

## 2023-10-10 DIAGNOSIS — I2510 Atherosclerotic heart disease of native coronary artery without angina pectoris: Secondary | ICD-10-CM | POA: Diagnosis not present

## 2023-10-10 DIAGNOSIS — Z87891 Personal history of nicotine dependence: Secondary | ICD-10-CM | POA: Diagnosis not present

## 2023-10-10 DIAGNOSIS — N289 Disorder of kidney and ureter, unspecified: Secondary | ICD-10-CM | POA: Diagnosis not present

## 2023-10-10 DIAGNOSIS — I120 Hypertensive chronic kidney disease with stage 5 chronic kidney disease or end stage renal disease: Secondary | ICD-10-CM | POA: Diagnosis not present

## 2023-10-10 DIAGNOSIS — I5020 Unspecified systolic (congestive) heart failure: Secondary | ICD-10-CM | POA: Diagnosis not present

## 2023-10-10 DIAGNOSIS — E871 Hypo-osmolality and hyponatremia: Secondary | ICD-10-CM | POA: Diagnosis not present

## 2023-10-10 DIAGNOSIS — R918 Other nonspecific abnormal finding of lung field: Secondary | ICD-10-CM | POA: Diagnosis not present

## 2023-10-10 DIAGNOSIS — N186 End stage renal disease: Secondary | ICD-10-CM | POA: Diagnosis not present

## 2023-10-10 DIAGNOSIS — D631 Anemia in chronic kidney disease: Secondary | ICD-10-CM | POA: Diagnosis not present

## 2023-10-10 LAB — GLUCOSE, CAPILLARY
Glucose-Capillary: 124 mg/dL — ABNORMAL HIGH (ref 70–99)
Glucose-Capillary: 155 mg/dL — ABNORMAL HIGH (ref 70–99)
Glucose-Capillary: 171 mg/dL — ABNORMAL HIGH (ref 70–99)
Glucose-Capillary: 178 mg/dL — ABNORMAL HIGH (ref 70–99)
Glucose-Capillary: 192 mg/dL — ABNORMAL HIGH (ref 70–99)

## 2023-10-10 LAB — CBC
HCT: 39.9 % (ref 39.0–52.0)
HCT: 36.6 % — ABNORMAL LOW (ref 39.0–52.0)
Hemoglobin: 13.1 g/dL (ref 13.0–17.0)
Hemoglobin: 12.2 g/dL — ABNORMAL LOW (ref 13.0–17.0)
MCH: 28.7 pg (ref 26.0–34.0)
MCH: 28.6 pg (ref 26.0–34.0)
MCHC: 32.8 g/dL (ref 30.0–36.0)
MCHC: 33.3 g/dL (ref 30.0–36.0)
MCV: 87.3 fL (ref 80.0–100.0)
MCV: 85.7 fL (ref 80.0–100.0)
Platelets: 301 K/uL (ref 150–400)
Platelets: 304 K/uL (ref 150–400)
RBC: 4.57 MIL/uL (ref 4.22–5.81)
RBC: 4.27 MIL/uL (ref 4.22–5.81)
RDW: 14.9 % (ref 11.5–15.5)
RDW: 14.9 % (ref 11.5–15.5)
WBC: 19.7 K/uL — ABNORMAL HIGH (ref 4.0–10.5)
WBC: 21.4 K/uL — ABNORMAL HIGH (ref 4.0–10.5)
nRBC: 0 % (ref 0.0–0.2)
nRBC: 0 % (ref 0.0–0.2)

## 2023-10-10 LAB — RENAL FUNCTION PANEL
Albumin: 2.2 g/dL — ABNORMAL LOW (ref 3.5–5.0)
Anion gap: 17 — ABNORMAL HIGH (ref 5–15)
BUN: 62 mg/dL — ABNORMAL HIGH (ref 8–23)
CO2: 21 mmol/L — ABNORMAL LOW (ref 22–32)
Calcium: 8.4 mg/dL — ABNORMAL LOW (ref 8.9–10.3)
Chloride: 96 mmol/L — ABNORMAL LOW (ref 98–111)
Creatinine, Ser: 12.07 mg/dL — ABNORMAL HIGH (ref 0.61–1.24)
GFR, Estimated: 4 mL/min — ABNORMAL LOW (ref >60)
Glucose, Bld: 172 mg/dL — ABNORMAL HIGH (ref 70–99)
Phosphorus: 5.2 mg/dL — ABNORMAL HIGH (ref 2.5–4.6)
Potassium: 3.9 mmol/L (ref 3.5–5.1)
Sodium: 134 mmol/L — ABNORMAL LOW (ref 135–145)

## 2023-10-10 LAB — CYTOLOGY - NON PAP

## 2023-10-10 LAB — PROCALCITONIN: Procalcitonin: 2.96 ng/mL

## 2023-10-10 MED ORDER — ONDANSETRON HCL 4 MG/2ML IJ SOLN
4.0000 mg | Freq: Four times a day (QID) | INTRAMUSCULAR | Status: DC | PRN
  Administered 2023-10-10: 4 mg via INTRAVENOUS
  Filled 2023-10-10: qty 2

## 2023-10-10 MED ORDER — OXYCODONE HCL 5 MG PO TABS
5.0000 mg | ORAL_TABLET | ORAL | Status: AC
  Administered 2023-10-10: 5 mg via ORAL
  Filled 2023-10-10: qty 1

## 2023-10-10 MED ORDER — HEPARIN SODIUM (PORCINE) 1000 UNIT/ML IJ SOLN
INTRAMUSCULAR | Status: AC
  Filled 2023-10-10: qty 4

## 2023-10-10 MED ORDER — ACETAMINOPHEN-CAFFEINE 500-65 MG PO TABS
2.0000 | ORAL_TABLET | ORAL | Status: DC
  Filled 2023-10-10: qty 2

## 2023-10-10 MED ORDER — MIDODRINE HCL 5 MG PO TABS
10.0000 mg | ORAL_TABLET | ORAL | Status: AC
  Administered 2023-10-10: 10 mg via ORAL
  Filled 2023-10-10: qty 2

## 2023-10-10 MED ORDER — SODIUM CHLORIDE 0.9 % IV SOLN
500.0000 mg | INTRAVENOUS | Status: DC
  Administered 2023-10-10: 500 mg via INTRAVENOUS
  Filled 2023-10-10 (×2): qty 5

## 2023-10-10 MED ORDER — MIDODRINE HCL 5 MG PO TABS
10.0000 mg | ORAL_TABLET | Freq: Once | ORAL | Status: AC
  Administered 2023-10-10: 10 mg via ORAL

## 2023-10-10 MED ORDER — SACCHAROMYCES BOULARDII 250 MG PO CAPS
250.0000 mg | ORAL_CAPSULE | Freq: Two times a day (BID) | ORAL | Status: DC
  Administered 2023-10-10 (×2): 250 mg via ORAL
  Filled 2023-10-10 (×4): qty 1

## 2023-10-10 MED ORDER — SODIUM CHLORIDE 0.9 % IV SOLN
1.0000 g | INTRAVENOUS | Status: DC
Start: 2023-10-10 — End: 2023-10-11
  Administered 2023-10-10: 1 g via INTRAVENOUS
  Filled 2023-10-10 (×2): qty 10

## 2023-10-10 MED ORDER — MIDODRINE HCL 5 MG PO TABS
ORAL_TABLET | ORAL | Status: AC
  Filled 2023-10-10: qty 1

## 2023-10-10 MED ORDER — SODIUM CHLORIDE 0.9 % IV BOLUS
250.0000 mL | INTRAVENOUS | Status: AC
  Administered 2023-10-10: 250 mL via INTRAVENOUS

## 2023-10-10 NOTE — Progress Notes (Signed)
Placed patient on CPAP for the night.  

## 2023-10-10 NOTE — Progress Notes (Signed)
 MD was notified due to pt being hypotensive, O2 SAT in the 80's and lethargic. MD placed orders, See MAR. Pt wasn't responding to the bolus or medication so we called the rapid response nurse Mindy, RN to come and evaluate the pt. She increased the bolus amount and assessed the pt. Upon completion of the bolus and midodrine  given twice Pt BP had risen above 100 SBP and above 40 DBP. Rapid said to continue watching and evaluating BP Q2 hrs. Unless there was a significant change in status. Due to these issue Pt was flagged as Yellow MEWS. Md was notified of this as well.

## 2023-10-10 NOTE — Plan of Care (Signed)

## 2023-10-10 NOTE — Consult Note (Addendum)
 Chief Complaint: Patient was seen in consultation today for RUL pulmonary mass with right chest wall infiltration, and with consideration for biopsy.  Referring Provider(s): Dr. Nilda Fendt, MD   Supervising Physician: Philip Cornet  Patient Status: [facility] - In-pt  Patient is Full Code  History of Present Illness: Anthony Bullock is a 63 y.o. male  with PMHx notable for HTN, HLD, CHF, pAF, CAD, ESRD on HD (TTS), DMT2, COPD, PVD s/p bilateral BKA, chronic anemia, GI bleed, and others as delineated below.  Per Dr. Ara progress note today: Hypoxemia due to pleural effusion-came to the hospital with chest discomfort and shortness of breath, pleuritic type chest pain for the past month.  Chest x-ray on admission showed large right-sided pleural effusion, radiology was consulted and he is status post 1.8 L removed on admission, unfortunately no fluid studies were sent.  Effusion appears persistent, underwent repeat thoracentesis 9/28, and fluid appears exudative.  Cytology unfortunately without malignant cells identified.  Per pulmonary evaluation yesterday, they recommend cardiothoracic surgery consultation for possible pleuroscopy and pleural biopsy, I called them today   Active problems Possible lung malignancy -after ultrasound guided thoracentesis x 2, it became apparent that there may be an underlying mass.  Patient underwent a CT scan of the chest on 9/28 which showed plaque-like pleural thickening extending into the right chest wall with pathologic fracture of the anterior right fifth rib, destruction of the anterior right 6th and 7th ribs, highly suspicion for malignancy. CT scan also showed some areas of loculated pleural fluid in the upper right hemithorax.  Pulmonary consulted, appreciate input, negative cytology, TCTS consulted.  Cardiac and Thoracic Surgery was consulted, and recommend referral to IR for percutaneous biopsy. IR was requested for right lung nodule biopsy. The  request was reviewed by Dr. Philip, and approved for chest wall lesion biopsy. Patient is tentatively scheduled for same in IR on Thursday 10/2 due to anticoagulation hold.    Patient is alert though somnolent, and laying in bed, calm. Wife is at bedside. Patient is currently without any significant complaints. He notes mild nausea with recent vomiting, and right shoulder pain. He has a chronic cough as well. Patient denies any fevers, headache, chest pain, SOB, abdominal pain, or bleeding.    Past Medical History:  Diagnosis Date   AICD (automatic cardioverter/defibrillator) present    [employer]   Allergic rhinitis    Anemia    Arthritis    Chronic systolic heart failure (HCC)    a. ECHO (12/2012) EF 25-30%, HK entireanteroseptal myocardium //  b.  EF 25%, diffuse HK, grade 1 diastolic dysfunction, MAC, mild LAE, normal RVSF, trivial pericardial effusion   COPD (chronic obstructive pulmonary disease) (HCC)    Diabetes mellitus type II    Diabetic nephropathy (HCC)    Diabetic neuropathy (HCC)    ESRD on hemodialysis (HCC)    started HD June 2017, goes to [facility] HD unit, Dr Edwardo   History of cardiac catheterization    a.Myoview  1/15:  There is significant left ventricular dysfunction. There may be slight scar at the apex. There is no significant ischemia. LV Ejection Fraction: 27%  //  b. RHC/LHC (1/15) with mean RA 6, PA 47/22 mean 33, mean PCWP 20, PVR 2.5 WU, CI 2.5; 80% dLAD stenosis, 70% diffuse large D.     History of kidney stones    Hyperlipidemia    Hypertension    Kidney stones    NICM (nonischemic cardiomyopathy) (HCC)  Primarily nonischemic.  Echo (12/14) with EF 25-30%.  Echo (3/15) with EF 25%, mild to moderately dilated LV, normal RV size and systolic function.     Osteomyelitis (HCC)    left fifth ray   Pneumonia    Urethral stricture    Wears glasses     Past Surgical History:  Procedure Laterality Date   ABDOMINAL AORTOGRAM W/LOWER EXTREMITY  N/A 03/30/2016   Procedure: Abdominal Aortogram w/Lower Extremity;  Surgeon: Lonni GORMAN Blade, MD;  Location: [facility] INVASIVE CV LAB;  Service: Cardiovascular;  Laterality: N/A;   AMPUTATION Right 04/26/2016   Procedure: Right Below Knee Amputation;  Surgeon: Jerona Harden GAILS, MD;  Location: [facility] OR;  Service: Orthopedics;  Laterality: Right;   AMPUTATION Left 08/21/2019   Procedure: LEFT FOOT 5TH RAY AMPUTATION;  Surgeon: Harden Jerona GAILS, MD;  Location: [facility] OR;  Service: Orthopedics;  Laterality: Left;   AMPUTATION Left 11/13/2019   Procedure: LEFT BELOW KNEE AMPUTATION;  Surgeon: Harden Jerona GAILS, MD;  Location: [facility] OR;  Service: Orthopedics;  Laterality: Left;   AV FISTULA PLACEMENT Right 09/08/2015   Procedure: INSERTION OF 4-62mm x 45cm  ARTERIOVENOUS (AV) GORE-TEX GRAFT RIGHT UPPER  ARM;  Surgeon: Lonni GORMAN Blade, MD;  Location: [facility] OR;  Service: Vascular;  Laterality: Right;   AV FISTULA PLACEMENT Left 01/14/2016   Procedure: CREATION OF LEFT UPPER ARM ARTERIOVENOUS FISTULA;  Surgeon: Lonni GORMAN Blade, MD;  Location: [facility] OR;  Service: Vascular;  Laterality: Left;   BASCILIC VEIN TRANSPOSITION Right 08/22/2014   Procedure: RIGHT UPPER ARM BASCILIC VEIN TRANSPOSITION;  Surgeon: Lonni GORMAN Blade, MD;  Location: [facility] OR;  Service: Vascular;  Laterality: Right;   BELOW KNEE LEG AMPUTATION Right 04/26/2016   BIOPSY OF SKIN SUBCUTANEOUS TISSUE AND/OR MUCOUS MEMBRANE  04/28/2023   Procedure: BIOPSY, SKIN, SUBCUTANEOUS TISSUE, OR MUCOUS MEMBRANE;  Surgeon: Shila Gustav GAILS, MD;  Location: [facility] ENDOSCOPY;  Service: Gastroenterology;;   CARDIAC CATHETERIZATION     CARDIAC DEFIBRILLATOR PLACEMENT  06/27/2013   Sub Q       BY DR FERNANDE   CATARACT EXTRACTION W/PHACO Right 08/06/2018   Procedure: CATARACT EXTRACTION PHACO AND INTRAOCULAR LENS PLACEMENT (IOC);  Surgeon: Harrie Agent, MD;  Location: [facility] ORS;  Service: Ophthalmology;  Laterality: Right;  CDE: 4.06   CATARACT EXTRACTION W/PHACO Left 08/20/2018    Procedure: CATARACT EXTRACTION PHACO AND INTRAOCULAR LENS PLACEMENT (IOC);  Surgeon: Harrie Agent, MD;  Location: [facility] ORS;  Service: Ophthalmology;  Laterality: Left;  CDE: 6.76   COLONOSCOPY WITH PROPOFOL  N/A 07/22/2015   Procedure: COLONOSCOPY WITH PROPOFOL ;  Surgeon: Victory LITTIE Legrand DOUGLAS, MD;  Location: [facility] ENDOSCOPY;  Service: Gastroenterology;  Laterality: N/A;   CORONARY LITHOTRIPSY N/A 04/14/2023   Procedure: CORONARY LITHOTRIPSY;  Surgeon: Anner Alm ORN, MD;  Location: [facility] INVASIVE CV LAB;  Service: Cardiovascular;  Laterality: N/A;  RCA   CORONARY STENT INTERVENTION N/A 04/12/2023   Procedure: CORONARY STENT INTERVENTION;  Surgeon: Swaziland, Peter M, MD;  Location: [facility] INVASIVE CV LAB;  Service: Cardiovascular;  Laterality: N/A;   CORONARY STENT INTERVENTION N/A 04/14/2023   Procedure: CORONARY STENT INTERVENTION;  Surgeon: Anner Alm ORN, MD;  Location: [facility] INVASIVE CV LAB;  Service: Cardiovascular;  Laterality: N/A;   ESOPHAGOGASTRODUODENOSCOPY N/A 04/28/2023   Procedure: EGD (ESOPHAGOGASTRODUODENOSCOPY);  Surgeon: Nandigam, Kavitha V, MD;  Location: [facility] ENDOSCOPY;  Service: Gastroenterology;  Laterality: N/A;   FEMORAL-POPLITEAL BYPASS GRAFT Right 03/31/2016   Procedure: BYPASS GRAFT FEMORAL-POPLITEAL ARTERY USING RIGHT GREATER SAPHENOUS NONREVERSED VEIN;  Surgeon: Lonni GORMAN  Eliza, MD;  Location: [facility] OR;  Service: Vascular;  Laterality: Right;   HERNIA REPAIR     I & D EXTREMITY Right 03/31/2016   Procedure: IRRIGATION AND DEBRIDEMENT FOOT;  Surgeon: Lonni GORMAN Eliza, MD;  Location: [facility] OR;  Service: Vascular;  Laterality: Right;   IMPLANTABLE CARDIOVERTER DEFIBRILLATOR IMPLANT N/A 06/27/2013   Procedure: SUB Q ICD;  Surgeon: Elspeth JAYSON Sage, MD;  Location: [facility] CATH LAB;  Service: Cardiovascular;  Laterality: N/A;   INTRAOPERATIVE ARTERIOGRAM Right 03/31/2016   Procedure: INTRA OPERATIVE ARTERIOGRAM;  Surgeon: Lonni GORMAN Eliza, MD;  Location: [facility] OR;  Service: Vascular;  Laterality: Right;    IR GENERIC HISTORICAL Right 11/30/2015   IR THROMBECTOMY AV FISTULA W/THROMBOLYSIS/PTA INC/SHUNT/IMG RIGHT 11/30/2015 Marcey Moan, MD [facility]-INTERV RAD   IR GENERIC HISTORICAL  11/30/2015   IR US  GUIDE VASC ACCESS RIGHT 11/30/2015 Marcey Moan, MD [facility]   IR GENERIC HISTORICAL Right 12/15/2015   IR THROMBECTOMY AV FISTULA W/THROMBOLYSIS/PTA/STENT INC/SHUNT/IMG RT 12/15/2015 Toribio Faes, MD [facility]   IR GENERIC HISTORICAL  12/15/2015   IR US  GUIDE VASC ACCESS RIGHT 12/15/2015 Toribio Faes, MD [facility]   IR GENERIC HISTORICAL  12/28/2015   IR FLUORO GUIDE CV LINE RIGHT 12/28/2015 Rome Hall, MD [facility]   IR GENERIC HISTORICAL  12/28/2015   IR US  GUIDE VASC ACCESS RIGHT 12/28/2015 Rome Hall, MD [facility]   LEFT A ND RIGHT HEART CATH  01/30/2013   DR CHET   LEFT AND RIGHT HEART CATHETERIZATION WITH CORONARY ANGIOGRAM N/A 01/30/2013   Procedure: LEFT AND RIGHT HEART CATHETERIZATION WITH CORONARY ANGIOGRAM;  Surgeon: Toribio JONELLE Fuel, MD;  Location: [facility] CATH LAB;  Service: Cardiovascular;  Laterality: N/A;   LEFT HEART CATH AND CORONARY ANGIOGRAPHY N/A 04/12/2023   Procedure: LEFT HEART CATH AND CORONARY ANGIOGRAPHY;  Surgeon: Swaziland, Peter M, MD;  Location: [facility] INVASIVE CV LAB;  Service: Cardiovascular;  Laterality: N/A;   PERIPHERAL VASCULAR CATHETERIZATION Right 01/26/2015   Procedure: A/V Fistulagram;  Surgeon: Lonni GORMAN Eliza, MD;  Location: [facility] INVASIVE CV LAB;  Service: Cardiovascular;  Laterality: Right;   reapea urethral surgery for recurrent obstruction  2011   TOTAL KNEE ARTHROPLASTY Right 2007   VEIN HARVEST Right 03/31/2016   Procedure: RIGHT GREATER SAPHENOUS VEIN HARVEST;  Surgeon: Lonni GORMAN Eliza, MD;  Location: [facility] OR;  Service: Vascular;  Laterality: Right;    Allergies: Epoetin  alfa, Ferumoxytol , and Morphine sulfate  Medications: Prior to Admission medications   Medication Sig Start Date End Date Taking? Authorizing Provider   acetaminophen  (TYLENOL ) 325 MG tablet Take 1-2 tablets (325-650 mg total) by mouth every 4 (four) hours as needed for mild pain. 11/29/19  Yes Love, Sharlet GORMAN, PA-C  amiodarone  (PACERONE ) 200 MG tablet TAKE 1 TABLET ( 200 MG ) BY MOUTH TWICE DAILY FOR 20 DAYS, THEN 1 TABLET ( 200 MG TOTAL ) ONCE DAILY Patient taking differently: Take 200 mg by mouth daily. 09/18/23  Yes Burchette, Wolm ORN, MD  apixaban  (ELIQUIS ) 5 MG TABS tablet Take 1 tablet (5 mg total) by mouth 2 (two) times daily. 05/22/23  Yes Ghimire, Donalda HERO, MD  atorvastatin  (LIPITOR ) 80 MG tablet Take 1 tablet (80 mg total) by mouth at bedtime. 09/20/23  Yes Burchette, Wolm ORN, MD  carvedilol  (COREG ) 6.25 MG tablet Take 1 tablet (6.25 mg total) by mouth 2 (two) times daily. 09/20/23  Yes Burchette, Wolm ORN, MD  cinacalcet  (SENSIPAR ) 30 MG tablet Take 1 tablet (30 mg total) by mouth Every Tuesday,Thursday,and Saturday  with dialysis. 11/30/19  Yes Love, Sharlet RAMAN, PA-C  clopidogrel  (PLAVIX ) 75 MG tablet Take 1 tablet (75 mg total) by mouth daily with breakfast. 08/14/23  Yes Burchette, Wolm ORN, MD  gabapentin  (NEURONTIN ) 300 MG capsule Take 1 capsule (300 mg total) by mouth at bedtime. 04/15/23  Yes Hongalgi, Anand D, MD  HUMALOG  KWIKPEN 200 UNIT/ML KwikPen INJECT A MAXIMUM OF 28 UNITS SUBCUTANEOUSLY TWICE DAILY WITH LUNCH AND SUPPER PER SLIDING SCALE. APPOINTMENT REQUIRED FOR FUTURE REFILLS 08/16/23  Yes Burchette, Wolm ORN, MD  lidocaine  (LIDODERM ) 5 % Place 1 patch onto the skin daily as needed (pain). Remove & Discard patch within 12 hours or as directed by MD   Yes [provider]  midodrine  (PROAMATINE ) 10 MG tablet Take 1 tablet (10 mg total) by mouth every 8 (eight) hours. Patient taking differently: Take 10 mg by mouth every 8 (eight) hours. Takes once a day on dialysis days ONLY 09/20/23  Yes Burchette, Wolm ORN, MD  multivitamin (RENA-VIT) TABS tablet Take 1 tablet by mouth once daily 07/06/21  Yes Burchette, Wolm ORN, MD  Olopatadine  HCl  0.2 % SOLN Place 1 drop into both eyes daily as needed (for allergies).    Yes [provider]  VENTOLIN  HFA 108 (90 Base) MCG/ACT inhaler INHALE 1 TO 2 PUFFS BY MOUTH EVERY 6 HOURS AS NEEDED FOR WHEEZING FOR SHORTNESS OF BREATH 05/05/23  Yes Burchette, Wolm ORN, MD  Continuous Glucose Receiver (FREESTYLE LIBRE 3 READER) DEVI Use to check blood glucose TID 08/11/22   Burchette, Wolm ORN, MD  Continuous Glucose Sensor (FREESTYLE LIBRE 3 PLUS SENSOR) MISC USE AS DIRECTED TO CHECK GLUCOSE DAILY. CHANGE EVERY 15 DAYS 05/24/23   Burchette, Wolm ORN, MD  Elastic Bandages & Supports (ABDOMINAL BINDER/ELASTIC MED) MISC 1 kit by Does not apply route daily. 06/09/23   Miriam Norris, NP  glucose blood test strip Check 1 time daily. E11.9 One Touch Ultra Blue Test Strips 03/10/14   Burchette, Wolm ORN, MD  Insulin  Pen Needle (BD PEN NEEDLE NANO U/F) 32G X 4 MM MISC USE 1 PEN NEEDLE SUBCUTANEOUSLY WITH INSULIN  4 TIMES DAILY 12/04/19   Burchette, Wolm ORN, MD  nitroGLYCERIN  (NITROSTAT ) 0.4 MG SL tablet Place 1 tablet (0.4 mg total) under the tongue every 5 (five) minutes as needed for chest pain. 04/21/23   Burchette, Wolm ORN, MD     Family History  Problem Relation Age of Onset   Bladder Cancer Mother    Alcohol  abuse Father    Melanoma Father    Stroke Maternal Grandmother    Heart Problems Maternal Grandmother        unknown   Diabetes Maternal Grandmother    Heart disease Maternal Grandfather    Prostate cancer Maternal Grandfather     Social History   Socioeconomic History   Marital status: Married    Spouse name: Not on file   Number of children: 0   Years of education: Not on file   Highest education level: Not on file  Occupational History   Not on file  Tobacco Use   Smoking status: Former    Current packs/day: 0.00    Average packs/day: 2.0 packs/day for 32.0 years (64.0 ttl pk-yrs)    Types: Cigarettes    Start date: 05/11/1978    Quit date: 05/11/2010    Years since quitting: 13.4    Smokeless tobacco: Never  Vaping Use   Vaping status: Never Used  Substance and Sexual Activity   Alcohol  use:  No   Drug use: No   Sexual activity: Yes  Other Topics Concern   Not on file  Social History Narrative   Works at [employer] as a Therapist, music      Right handed   Wears glasses    Drinks 10 oz coffee daily   Drinks 2 sodas per day      Pt disabled    Lives with wife    Social Drivers of Corporate investment banker Strain: Low Risk  (02/03/2023)   Overall Financial Resource Strain (CARDIA)    Difficulty of Paying Living Expenses: Not hard at all  Food Insecurity: No Food Insecurity (10/06/2023)   Hunger Vital Sign    Worried About Running Out of Food in the Last Year: Never true    Ran Out of Food in the Last Year: Never true  Transportation Needs: No Transportation Needs (10/06/2023)   PRAPARE - Administrator, Civil Service (Medical): No    Lack of Transportation (Non-Medical): No  Physical Activity: Inactive (02/03/2023)   Exercise Vital Sign    Days of Exercise per Week: 0 days    Minutes of Exercise per Session: 0 min  Stress: No Stress Concern Present (02/03/2023)   [employer] of Occupational Health - Occupational Stress Questionnaire    Feeling of Stress : Not at all  Social Connections: Socially Integrated (04/27/2023)   Social Connection and Isolation Panel    Frequency of Communication with Friends and Family: More than three times a week    Frequency of Social Gatherings with Friends and Family: More than three times a week    Attends Religious Services: More than 4 times per year    Active Member of [employer] or Organizations: Yes    Attends Engineer, structural: More than 4 times per year    Marital Status: Married     Review of Systems: A 12 point ROS discussed and pertinent positives are indicated in the HPI above.  All other systems are negative.  Vital Signs: BP (!) 107/45 (BP Location: Right Arm)   Pulse 64   Temp 98.7 F  (37.1 C) (Oral)   Resp 18   Ht 5' 10 (1.778 m)   Wt 186 lb 1.1 oz (84.4 kg)   SpO2 96%   BMI 26.70 kg/m   Advance Care Plan: The advanced care place/surrogate decision maker was discussed at the time of visit and the patient did not wish to discuss or was not able to name a surrogate decision maker or provide an advance care plan.  Physical Exam Vitals reviewed.  Constitutional:      General: He is not in acute distress.    Appearance: Normal appearance. He is ill-appearing.     Comments: Patient is somnolent, difficult to ascertain if he is fully alert.  HENT:     Mouth/Throat:     Mouth: Mucous membranes are dry.  Cardiovascular:     Rate and Rhythm: Normal rate and regular rhythm.  Pulmonary:     Breath sounds: Examination of the right-upper field reveals rales. Examination of the left-upper field reveals rales. Rales present.     Comments: Patient is on 2 L O2 by [place] to maintain normal saturation. Chest:     Chest wall: Tenderness present.     Comments: Right-sided chest wall pain with radiation to the back/right scapula. Abdominal:     Palpations: Abdomen is soft.  Musculoskeletal:     Comments: S/p bilateral BKA.  Skin:    General: Skin is warm and dry.  Neurological:     Comments: Patient is somnolent.  Psychiatric:        Mood and Affect: Mood normal.        Behavior: Behavior normal.        Thought Content: Thought content normal.        Judgment: Judgment normal.     Imaging: DG CHEST PORT 1 VIEW Result Date: 10/10/2023 CLINICAL DATA:  100030. Leukocytosis and pleural effusion. End-stage renal disease. Hypotension. EXAM: PORTABLE CHEST 1 VIEW COMPARISON:  Chest CT 10/08/2023. FINDINGS: 6:40 a.m. External defibrillator with left chest power source and presternal wire insertion are unchanged as well as a right IJ dialysis catheter with the tip in the right atrium. Moderate-to-large loculated right pleural effusion with overlying lung opacities, appears increased  from 2 days ago. Extensive nodular pleural metastatic disease on the right is better seen with CT, not well demonstrated radiographically. Left lung remains clear. Stable mediastinum. There is aortic atherosclerosis. The cardiac size is normal. No new osseous finding. IMPRESSION: 1. Moderate-to-large loculated right pleural effusion with overlying lung opacities, appears increased from 2 days ago. 2. Extensive nodular pleural metastatic disease on the right is better seen with CT, not well demonstrated radiographically. 3. Aortic atherosclerosis. Electronically Signed   By: Francis Quam M.D.   On: 10/10/2023 06:54   CT CHEST W CONTRAST Result Date: 10/09/2023 CLINICAL DATA:  Possible pulmonary mass on recent chest x-ray EXAM: CT CHEST WITH CONTRAST TECHNIQUE: Multidetector CT imaging of the chest was performed during intravenous contrast administration. RADIATION DOSE REDUCTION: This exam was performed according to the departmental dose-optimization program which includes automated exposure control, adjustment of the mA and/or kV according to patient size and/or use of iterative reconstruction technique. CONTRAST:  75mL OMNIPAQUE  IOHEXOL  350 MG/ML SOLN COMPARISON:  Chest x-ray earlier same day FINDINGS: Cardiovascular: The heart size is normal. No substantial pericardial effusion. Coronary artery calcification is evident. Moderate atherosclerotic calcification is noted in the wall of the thoracic aorta. Right IJ central line tip is positioned at the SVC/RA junction. Extracardiac subcutaneous implantable defibrillator evident. Mediastinum/Nodes: 13 mm short axis paraesophageal lymph node seen anterior to the spine on 68/3. Upper normal right hilar lymph nodes. No left hilar lymphadenopathy. Juxta diaphragmatic ingested cardiac lymphadenopathy is seen in the anterior right chest there is no axillary lymphadenopathy. The esophagus has normal imaging features. Lungs/Pleura: Centrilobular and paraseptal emphysema  evident. 17 mm ground-glass opacity identified anterior left upper lobe on 39/4. 5 mm left lower lobe nodule seen on 119/4. Subsegmental atelectasis noted left base. Volume loss noted right upper and middle lobes with more pronounced collapse/consolidation in the right lower lobe. Areas of loculated pleural fluid are seen in the upper right hemithorax. Extensive pleural nodularity is seen in the right chest including plaque-like nodular pleural thickening inferolaterally on 95/3. Posterior right pleural nodule measures 4.0 x 1.0 cm on 128/3. Confluent soft tissue is seen in the anterior and posterior pleural reflections. The anterolateral pleural disease extends into the right chest wall (see image 101/3) with pathologic fracture of the anterior right fifth rib and destruction of the anterior right sixth and seventh ribs. Upper Abdomen: Visualized portion of the upper abdomen shows no acute findings. Musculoskeletal: See above for bony changes in anterior right fifth through seventh ribs. Otherwise no suspicious lytic or sclerotic osseous abnormality. IMPRESSION: 1. Extensive pleural nodularity in the right chest including plaque-like nodular pleural thickening inferolaterally. The anterolateral pleural disease extends  into the right chest wall with pathologic fracture of the anterior right fifth rib and destruction of the anterior right sixth and seventh ribs. Imaging features are highly suspicious for malignancy. 2. 17 mm ground-glass opacity anterior left upper lobe. This is nonspecific and may be infectious/inflammatory. 3. 5 mm left lower lobe pulmonary nodule. 4. 13 mm short axis paraesophageal lymph node anterior to the spine. Metastatic disease a concern. 5. Aortic Atherosclerosis (ICD10-I70.0) and Emphysema (ICD10-J43.9). Electronically Signed   By: Camellia Candle M.D.   On: 10/09/2023 06:58   US  THORACENTESIS ASP PLEURAL SPACE W/IMG GUIDE Result Date: 10/08/2023 INDICATION: 36304 Hypoxemia 36304 History  of ESRD, CHF. Currently in hospital with hypoxemia due to right pleural effusion. Had thoracentesis 2 days ago with 1.85 L removed. Found to have recurrent increasing right pleural effusion on chest x-ray today. Consulted for repeat right thoracentesis. EXAM: ULTRASOUND GUIDED RIGHT THORACENTESIS MEDICATIONS: 10 mL 1% lidocaine  COMPLICATIONS: None immediate. PROCEDURE: An ultrasound guided thoracentesis was thoroughly discussed with the patient and questions answered. The benefits, risks, alternatives and complications were also discussed. The patient understands and wishes to proceed with the procedure. Written consent was obtained. Ultrasound was performed to localize and mark an adequate pocket of fluid in the RIGHT chest. The area was then prepped and draped in the normal sterile fashion. 1% Lidocaine  was used for local anesthesia. Under ultrasound guidance a 6 Fr Safe-T-Centesis catheter was introduced. Thoracentesis was performed. The catheter was removed and a dressing applied. FINDINGS: A total of approximately 520 mL of amber fluid was removed. Samples were sent to the laboratory as requested by the clinical team. IMPRESSION: Successful ultrasound guided RIGHT thoracentesis yielding 520 mL of pleural fluid. Performed and dictated by Kimble Clas, PA-C under direct supervision of Thom Hall, MD Electronically Signed   By: Thom Hall M.D.   On: 10/08/2023 14:20   DG Chest 1 View Result Date: 10/08/2023 CLINICAL DATA:  [id number] S/P thoracentesis [id number] EXAM: CHEST  1 VIEW COMPARISON:  October 08, 2023 FINDINGS: The cardiomediastinal silhouette is unchanged in contour.RIGHT chest CVC with poor visualization of the tip of the CVC secondary to underpenetration. This favored to project over the RIGHT atrium. LEFT chest AICD. Moderate RIGHT-sided pleural effusion, decreased from prior. No significant pneumothorax. There is a persistent rounded opacification along the RIGHT lateral lung base. IMPRESSION: 1.  Moderate RIGHT-sided pleural effusion, decreased from prior. No significant pneumothorax. 2. Persistent rounded opacification along the RIGHT lateral lung base. This could reflect a loculated component of the pleural effusion versus underlying mass. Recommend correlation with thoracocentesis fluid results with consideration of dedicated CT chest with contrast if clinically indicated. Electronically Signed   By: Corean Salter M.D.   On: 10/08/2023 13:07   ECHOCARDIOGRAM COMPLETE Result Date: 10/08/2023    ECHOCARDIOGRAM REPORT   Patient Name:   Anthony Bullock Date of Exam: 10/08/2023 Medical Rec #:  [id number]     Height:       70.0 in Accession #:    [id number]    Weight:       186.1 lb Date of Birth:  [birth year]     BSA:          2.024 m Patient Age:    63 years      BP:           101/51 mmHg Patient Gender: M             HR:  73 bpm. Exam Location:  Inpatient Procedure: 2D Echo, Cardiac Doppler and Color Doppler (Both Spectral and Color            Flow Doppler were utilized during procedure). Indications:    CHF - Acute Diastolic I50.31  History:        Patient has prior history of Echocardiogram examinations, most                 recent 04/11/2023. CHF and Cardiomyopathy, CAD and Previous                 Myocardial Infarction, ESRD, PAD and COPD, Arrythmias:Atrial                 Fibrillation, Signs/Symptoms:Hypotension and Chest Pain; Risk                 Factors:Hypertension, Dyslipidemia and Diabetes.  Sonographer:    Thea Norlander RCS Referring Phys: COSTIN M GHERGHE IMPRESSIONS  1. Left ventricular ejection fraction, by estimation, is 30 to 35%. The left ventricle has moderately decreased function. The left ventricle demonstrates global hypokinesis. Left ventricular diastolic parameters are consistent with Grade I diastolic dysfunction (impaired relaxation).  2. Right ventricular systolic function is normal. The right ventricular size is normal.  3. The mitral valve is normal in structure. No  evidence of mitral valve regurgitation. No evidence of mitral stenosis.  4. The aortic valve is calcified. Aortic valve regurgitation is not visualized. Aortic valve sclerosis is present, with no evidence of aortic valve stenosis.  5. The inferior vena cava is normal in size with greater than 50% respiratory variability, suggesting right atrial pressure of 3 mmHg. FINDINGS  Left Ventricle: Left ventricular ejection fraction, by estimation, is 30 to 35%. The left ventricle has moderately decreased function. The left ventricle demonstrates global hypokinesis. The left ventricular internal cavity size was normal in size. There is no left ventricular hypertrophy. Left ventricular diastolic parameters are consistent with Grade I diastolic dysfunction (impaired relaxation).  LV Wall Scoring: The posterior wall is akinetic. Right Ventricle: The right ventricular size is normal. No increase in right ventricular wall thickness. Right ventricular systolic function is normal. Left Atrium: Left atrial size was normal in size. Right Atrium: Right atrial size was normal in size. Pericardium: There is no evidence of pericardial effusion. Mitral Valve: The mitral valve is normal in structure. No evidence of mitral valve regurgitation. No evidence of mitral valve stenosis. Tricuspid Valve: The tricuspid valve is normal in structure. Tricuspid valve regurgitation is not demonstrated. No evidence of tricuspid stenosis. Aortic Valve: The aortic valve is calcified. Aortic valve regurgitation is not visualized. Aortic valve sclerosis is present, with no evidence of aortic valve stenosis. Aortic valve peak gradient measures 2.7 mmHg. Pulmonic Valve: The pulmonic valve was normal in structure. Pulmonic valve regurgitation is not visualized. No evidence of pulmonic stenosis. Aorta: The aortic root is normal in size and structure. Venous: The inferior vena cava is normal in size with greater than 50% respiratory variability, suggesting right  atrial pressure of 3 mmHg. IAS/Shunts: No atrial level shunt detected by color flow Doppler.  LEFT VENTRICLE PLAX 2D LVIDd:         5.40 cm   Diastology LVIDs:         4.30 cm   LV e' medial:    4.46 cm/s LV PW:         0.90 cm   LV E/e' medial:  8.9 LV IVS:  1.00 cm   LV e' lateral:   5.22 cm/s LVOT diam:     2.20 cm   LV E/e' lateral: 7.6 LV SV:         50 LV SV Index:   25 LVOT Area:     3.80 cm  RIGHT VENTRICLE            IVC RV S prime:     9.57 cm/s  IVC diam: 1.20 cm TAPSE (M-mode): 1.9 cm LEFT ATRIUM           Index        RIGHT ATRIUM          Index LA diam:      3.20 cm 1.58 cm/m   RA Area:     8.68 cm LA Vol (A2C): 26.9 ml 13.29 ml/m  RA Volume:   13.70 ml 6.77 ml/m LA Vol (A4C): 22.1 ml 10.92 ml/m  AORTIC VALVE AV Area (Vmax): 3.13 cm AV Vmax:        82.00 cm/s AV Peak Grad:   2.7 mmHg LVOT Vmax:      67.60 cm/s LVOT Vmean:     46.100 cm/s LVOT VTI:       0.132 m  AORTA Ao Root diam: 3.50 cm Ao Asc diam:  2.90 cm MITRAL VALVE MV Area (PHT): 4.06 cm    SHUNTS MV Decel Time: 187 msec    Systemic VTI:  0.13 m MV E velocity: 39.80 cm/s  Systemic Diam: 2.20 cm MV A velocity: 97.50 cm/s MV E/A ratio:  0.41 Oneil Parchment MD Electronically signed by Oneil Parchment MD Signature Date/Time: 10/08/2023/12:32:34 PM    Final    DG CHEST PORT 1 VIEW Result Date: 10/08/2023 CLINICAL DATA:  Hypoxemia. EXAM: PORTABLE CHEST 1 VIEW COMPARISON:  10/06/2023 FINDINGS: Right-sided dialysis catheter remains in place. Increasing right pleural effusion, tracking laterally. Associated opacity in the right hemithorax may represent compressive atelectasis. Subsegmental atelectasis at the left lung base. No visible pneumothorax. No pulmonary edema. Left-sided pacemaker remains in place. IMPRESSION: Increasing right pleural effusion, tracking laterally. Associated opacity in the right hemithorax may represent compressive atelectasis. Electronically Signed   By: Andrea Gasman M.D.   On: 10/08/2023 10:20   DG Chest 1  View Result Date: 10/06/2023 CLINICAL DATA:  Right thoracentesis EXAM: CHEST  1 VIEW COMPARISON:  10/06/2023 FINDINGS: Single frontal view of the chest demonstrates stable defibrillator and right internal jugular dialysis catheter. Decreased right pleural effusion after interval thoracentesis, with mild to moderate residual fluid and consolidation at the right lung base. No evidence of pneumothorax. Left chest is clear. IMPRESSION: 1. No evidence of pneumothorax after right thoracentesis. Decreased right effusion, with mild to moderate residual fluid and consolidation at the right lung base. Electronically Signed   By: Ozell Daring M.D.   On: 10/06/2023 16:53   US  THORACENTESIS ASP PLEURAL SPACE W/IMG GUIDE Result Date: 10/06/2023 INDICATION: Patient with request for therapeutic thoracentesis for large right pleural effusion. EXAM: ULTRASOUND GUIDED THERAPEUTIC RIGHT THORACENTESIS MEDICATIONS: 9 mL 1% lidocaine  COMPLICATIONS: None immediate. PROCEDURE: An ultrasound guided thoracentesis was thoroughly discussed with the patient and questions answered. The benefits, risks, alternatives and complications were also discussed. The patient understands and wishes to proceed with the procedure. Written consent was obtained. Ultrasound was performed to localize and mark an adequate pocket of fluid in the right chest. The area was then prepped and draped in the normal sterile fashion. 1% Lidocaine  was used for local anesthesia. Under ultrasound guidance  a 6 Fr Safe-T-Centesis catheter was introduced. Thoracentesis was performed. The catheter was removed and a dressing applied. FINDINGS: A total of approximately 1850 mL of amber fluid was removed. IMPRESSION: Successful ultrasound guided right thoracentesis yielding 1850 mL of pleural fluid. Performed and dictated by Laymon Coast, NP Electronically Signed   By: Marcey Moan M.D.   On: 10/06/2023 16:49   US  Abdomen Limited RUQ (LIVER/GB) Result Date:  10/06/2023 CLINICAL DATA:  [id number] RUQ abdominal pain [id number] EXAM: ULTRASOUND ABDOMEN LIMITED RIGHT UPPER QUADRANT COMPARISON:  09/28/2009 FINDINGS: Gallbladder: Single small gallstone. Moderate circumferential wall thickening of the gallbladder. No pericholecystic fluid. No sonographic Murphy's sign noted by sonographer. Common bile duct: Diameter: 4 mm Liver: Normal echogenicity. No focal lesion identified. No intrahepatic biliary ductal dilation. Portal vein is patent on color Doppler imaging with normal direction of blood flow towards the liver. Right Kidney: Possible posterior shadowing overlying the partially visualized renal parenchyma, possibly nonobstructive calculi. Other: Moderate right pleural effusion. IMPRESSION: 1. Mild gallbladder wall thickening, which in the absence of pericholecystic fluid and a sonographic Murphy's sign, may be due to reactive to biliary stasis, upper abdominal inflammation, acute hepatitis, or volume overload from either CHF or renal failure. Laboratory correlation recommended. 2. Moderate volume right pleural effusion. 3. Scattered posterior shadowing overlying the partially visualized right kidney, possibly representing nonobstructive renal calculi. Electronically Signed   By: Rogelia Myers M.D.   On: 10/06/2023 15:41   DG Chest Port 1 View Result Date: 10/06/2023 CLINICAL DATA:  Shortness of breath EXAM: PORTABLE CHEST 1 VIEW COMPARISON:  10/05/2023 FINDINGS: Right central venous catheter with tip over the cavoatrial junction region. Cardiac pacemaker. Shallow inspiration. Cardiac enlargement. Large right pleural effusion with basilar atelectasis and/or consolidation. This is mildly progressed since prior study, possibly due to differences in patient positioning. The left lung is clear. Visualized mediastinal contours appear intact. Degenerative changes in the spine. IMPRESSION: Large right pleural effusion with basilar atelectasis and/or consolidation demonstrating mild  progression. Electronically Signed   By: Elsie Gravely M.D.   On: 10/06/2023 15:08   DG Chest 2 View Result Date: 10/05/2023 CLINICAL DATA:  Shortness of breath. EXAM: CHEST - 2 VIEW COMPARISON:  05/21/2023 FINDINGS: Right IJ dialysis catheter unchanged. Left-sided pacer unchanged. Lungs are adequately inflated as the left lung is clear. Interval worsening of a moderate to large right pleural effusion likely with associated compressive atelectasis. Effusion occupies approximately 3/4 of the right hemithorax. Cardiomediastinal silhouette and remainder of the exam is unchanged. IMPRESSION: Interval worsening of a moderate to large right pleural effusion likely with associated compressive atelectasis. Electronically Signed   By: Toribio Agreste M.D.   On: 10/05/2023 11:43    Labs:  CBC: Recent Labs    05/22/23 0807 10/06/23 1359 10/07/23 0523 10/10/23 0206  WBC 12.6* 9.9 10.1 19.7*  HGB 11.5* 13.6 13.1 13.1  HCT 36.9* 42.3 39.9 39.9  PLT 203 240 229 301    COAGS: Recent Labs    04/11/23 0505 04/11/23 1936 05/01/23 0904 05/01/23 1233 05/01/23 1857 05/02/23 0610 05/21/23 2226  INR 1.4*  --   --   --   --   --  1.1  APTT  --    < > 75* 80* 53* 83*  --    < > = values in this interval not displayed.    BMP: Recent Labs    10/07/23 0523 10/08/23 0244 10/09/23 0258 10/10/23 0206  NA 133* 133* 132* 134*  K 3.6 3.6 3.6 3.9  CL 96* 96* 95* 96*  CO2 24 24 22  21*  GLUCOSE 154* 170* 212* 172*  BUN 42* 30* 47* 62*  CALCIUM  9.0 8.6* 8.4* 8.4*  CREATININE 9.42* 7.66* 10.09* 12.07*  GFRNONAA 6* 7* 5* 4*    LIVER FUNCTION TESTS: Recent Labs    04/27/23 0404 04/28/23 0639 04/30/23 0936 05/01/23 0509 06/19/23 1433 10/06/23 1400 10/07/23 0523 10/08/23 0244 10/09/23 0258 10/10/23 0206  BILITOT 0.4  --  0.8  --  0.4 0.5  --   --   --   --   AST 28  --  47*  --  23 24  --   --   --   --   ALT 22  --  25  --  30 17  --   --   --   --   ALKPHOS 62  --  71  --  201* 101  --    --   --   --   PROT 5.9*  --  5.9*  --  7.1 7.1  --   --   --   --   ALBUMIN  2.6*   < > 2.2*   < > 3.7 2.7* 2.4* 2.4* 2.2* 2.2*   < > = values in this interval not displayed.    TUMOR MARKERS: No results for input(s): AFPTM, CEA, CA199, CHROMGRNA in the last 8760 hours.  Assessment and Plan: Per Dr. Ara progress note today: [...] Patient underwent a CT scan of the chest on 9/28 which showed plaque-like pleural thickening extending into the right chest wall with pathologic fracture of the anterior right fifth rib, destruction of the anterior right 6th and 7th ribs, highly suspicion for malignancy. CT scan also showed some areas of loculated pleural fluid in the upper right hemithorax.  Pulmonary consulted, appreciate input, negative cytology, TCTS consulted.  Cardiac and Thoracic Surgery was consulted, and recommend referral to IR for percutaneous biopsy.  Patient will present for scheduled chest wall lesion biopsy in IR on Thursday 10/2, with local anesthesia only. A thoracentesis is also tentatively planned while patient is in IR dept for his biopsy.  Patient is a poor candidate for moderate sedation due to prior history of difficulty waking from sedation and severe hypotension requiring midodrine /pressors. Patient and his wife declined MAC sedation, and patient is agreeable with local anesthetic only. This is a superficial biopsy. As this is a local anesthesia only case, patient will not need to remain NPO.  Patient is to remain on Plavix . However, Eliquis  was discontinued today in anticipation of upcoming biopsy. All remaining labs and medications are currently within acceptable parameters.  Allergies reviewed: Epoetin  Alfa; Ferumoxytol , Morphine sulfate.  Risks and benefits of chest wall biopsy was discussed with the patient and/or patient's family including, but not limited to bleeding, infection, damage to adjacent structures or low yield requiring additional tests.  All  of the questions were answered and there is agreement to proceed.  Consent signed and in chart.   Thank you for allowing our service to participate in Anthony Bullock 's care.  Electronically Signed: Carlin DELENA Griffon, PA-C   10/10/2023, 3:11 PM      I spent a total of 40 Minutes in face to face in clinical consultation, greater than 50% of which was counseling/coordinating care for RUL pulmonary mass with right chest wall infiltration, and with consideration for biopsy.

## 2023-10-10 NOTE — Progress Notes (Signed)
 Radiology at bedside for x ray

## 2023-10-10 NOTE — Progress Notes (Addendum)
 PROGRESS NOTE  Anthony Bullock FMW:[id number] DOB: July 02, 1960 DOA: 10/06/2023 PCP: Micheal Wolm LELON, MD   LOS: 3 days   Brief Narrative / Interim history: Anthony Bullock is a 63 y.o. male with medical history significant of HTN, HLD, chronic systolic CHF, paroxysmal atrial fibrillation, CAD ESRD on HD (TTS), DM type II, COPD, PVD s/p bilateral BKA, chronic anemia, and GI bleed who presents with shortness of breath and right-sided chest pain. The patient experiences shortness of breath and right-sided chest pain radiating to the right shoulder. They underwent dialysis the previous day, during which x-rays were ordered.  Imaging on admission showed large right-sided pleural effusion and he was admitted to the hospital  Subjective / 24h Interval events: Overnight events noted, patient was found to be hypotensive, less responsive and rapid response was called.  He was given midodrine  and IV fluids  On my evaluation this morning he is feeling better, alert and oriented x 4.  Wife is at bedside.  Assesement and Plan: Principal problem Hypoxemia due to pleural effusion-came to the hospital with chest discomfort and shortness of breath, pleuritic type chest pain for the past month.  Chest x-ray on admission showed large right-sided pleural effusion, radiology was consulted and he is status post 1.8 L removed on admission, unfortunately no fluid studies were sent.  Effusion appears persistent, underwent repeat thoracentesis 9/28, and fluid appears exudative.  Cytology unfortunately without malignant cells identified.  Per pulmonary evaluation yesterday, they recommend cardiothoracic surgery consultation for possible pleuroscopy and pleural biopsy, I called them today  Active problems Possible lung malignancy -after ultrasound guided thoracentesis x 2, it became apparent that there may be an underlying mass.  Patient underwent a CT scan of the chest on 9/28 which showed plaque-like pleural thickening  extending into the right chest wall with pathologic fracture of the anterior right fifth rib, destruction of the anterior right 6th and 7th ribs, highly suspicion for malignancy. CT scan also showed some areas of loculated pleural fluid in the upper right hemithorax.  Pulmonary consulted, appreciate input, negative cytology, TCTS consulted  Leukocytosis-patient with worsening hypotension overnight, poor responsiveness, rapid response was called.  On repeat blood work it looks like his white count increased significantly, procalcitonin is also elevated.  Repeat CT scan this morning shows worsening right-sided pleural effusion with overlying lung opacities.  Since underlying pneumonia could not be ruled out, start antibiotics  ESRD-scheduled to go HD today.  Nephrology following. Ultrafiltration limited by chronic hypotension  Chronic hypotension-continue midodrine   CAD-most recent cardiac cath April 2025 status post 2 site PCI of the RCA using score flex angioplasty followed by shockwave lithotripsy.  Continue Plavix  along with Eliquis .  No chest pain  COPD, tobacco use in remission-has 30-pack-year smoking history, quit a long time ago, he does not remember whether it was 2001 or 2011.  Has a pulse ox at home, normally sees O2 sats in the low to mid 90s  Chronic systolic CHF-most recent 2D echo done in April 2025 shows LVEF 40%, global hypokinesis.  He has grade 1 diastolic dysfunction, RV systolic function and size were normal - Repeat 2D echo 9/28 shows LVEF 30 to 35%, global hypokinesis, grade 1 diastolic dysfunction.  Given fluid is exudative and there is concern for malignancy this is unlikely to contribute.  Clinically he appears euvolemic, continue home medications  PAF-currently in sinus, continue amiodarone , apixaban , hold carvedilol  due to worsening hypotension overnight  Hyperlipidemia-continue statin  Hyponatremia-mild, fluid balance with hemodialysis  Type 2  diabetes mellitus, with  hyperglycemia-continue glargine, sliding scale.  CBGs reviewed and acceptable  Lab Results  Component Value Date   HGBA1C 6.7 (A) 09/20/2023   CBG (last 3)  Recent Labs    10/09/23 2210 10/10/23 0035 10/10/23 0831  GLUCAP 159* 155* 171*    Scheduled Meds:  amiodarone   200 mg Oral Daily   atorvastatin   80 mg Oral QHS   carvedilol   6.25 mg Oral BID   Chlorhexidine  Gluconate Cloth  6 each Topical Q0600   clopidogrel   75 mg Oral Q breakfast   feeding supplement (NEPRO CARB STEADY)  237 mL Oral BID BM   insulin  aspart  0-9 Units Subcutaneous TID WC   insulin  glargine  16 Units Subcutaneous QHS   midodrine   10 mg Oral Q T,Th,Sa-HD   multivitamin  1 tablet Oral Daily   saccharomyces boulardii  250 mg Oral BID   sodium chloride  flush  3 mL Intravenous Q12H   Continuous Infusions:  azithromycin  500 mg (10/10/23 0846)   cefTRIAXone  (ROCEPHIN )  IV     PRN Meds:.acetaminophen  **OR** acetaminophen , albuterol , ondansetron  (ZOFRAN ) IV, oxyCODONE   Current Outpatient Medications  Medication Instructions   acetaminophen  (TYLENOL ) 325-650 mg, Oral, Every 4 hours PRN   amiodarone  (PACERONE ) 200 MG tablet TAKE 1 TABLET ( 200 MG ) BY MOUTH TWICE DAILY FOR 20 DAYS, THEN 1 TABLET ( 200 MG TOTAL ) ONCE DAILY   apixaban  (ELIQUIS ) 5 mg, Oral, 2 times daily   atorvastatin  (LIPITOR ) 80 mg, Oral, Daily at bedtime   carvedilol  (COREG ) 6.25 mg, Oral, 2 times daily   cinacalcet  (SENSIPAR ) 30 mg, Oral, Every T-Th-Sa (Hemodialysis)   clopidogrel  (PLAVIX ) 75 mg, Oral, Daily with breakfast   Continuous Glucose Receiver (FREESTYLE LIBRE 3 READER) DEVI Use to check blood glucose TID   Continuous Glucose Sensor (FREESTYLE LIBRE 3 PLUS SENSOR) MISC USE AS DIRECTED TO CHECK GLUCOSE DAILY. CHANGE EVERY 15 DAYS   Elastic Bandages & Supports (ABDOMINAL BINDER/ELASTIC MED) MISC 1 kit, Does not apply, Daily   gabapentin  (NEURONTIN ) 300 mg, Oral, Daily at bedtime   glucose blood test strip Check 1 time daily. E11.9  One Touch Ultra Blue Test Strips   HUMALOG  KWIKPEN 200 UNIT/ML KwikPen INJECT A MAXIMUM OF 28 UNITS SUBCUTANEOUSLY TWICE DAILY WITH LUNCH AND SUPPER PER SLIDING SCALE. APPOINTMENT REQUIRED FOR FUTURE REFILLS   Insulin  Pen Needle (BD PEN NEEDLE NANO U/F) 32G X 4 MM MISC USE 1 PEN NEEDLE SUBCUTANEOUSLY WITH INSULIN  4 TIMES DAILY   lidocaine  (LIDODERM ) 5 % 1 patch, Daily PRN   midodrine  (PROAMATINE ) 10 mg, Oral, Every 8 hours   multivitamin (RENA-VIT) TABS tablet Take 1 tablet by mouth once daily   nitroGLYCERIN  (NITROSTAT ) 0.4 mg, Sublingual, Every 5 min PRN   Olopatadine  HCl 0.2 % SOLN 1 drop, Daily PRN   VENTOLIN  HFA 108 (90 Base) MCG/ACT inhaler INHALE 1 TO 2 PUFFS BY MOUTH EVERY 6 HOURS AS NEEDED FOR WHEEZING FOR SHORTNESS OF BREATH    Diet Orders (From admission, onward)     Start     Ordered   10/06/23 1639  Diet renal/carb modified with fluid restriction Diet-HS Snack? Nothing; Fluid restriction: 1200 mL Fluid; Room service appropriate? Yes; Fluid consistency: Thin  Diet effective now       Question Answer Comment  Diet-HS Snack? Nothing   Fluid restriction: 1200 mL Fluid   Room service appropriate? Yes   Fluid consistency: Thin      10/06/23 1639  DVT prophylaxis:    Lab Results  Component Value Date   PLT 301 10/10/2023      Code Status: Full Code  Family Communication: wife at bedside   Status is: Inpatient Remains inpatient appropriate because: Ongoing pleural effusion   Level of care: Telemetry Medical  Consultants:  Nephrology  Objective: Vitals:   10/10/23 0101 10/10/23 0336 10/10/23 0539 10/10/23 0803  BP: (!) 101/42 (!) 96/41 (!) 98/39 (!) 101/38  Pulse:  65 67 73  Resp:  14 16   Temp:   99.3 F (37.4 C)   TempSrc:   Oral   SpO2: 98% 95% 96% 93%  Weight:      Height:        Intake/Output Summary (Last 24 hours) at 10/10/2023 0941 Last data filed at 10/10/2023 0424 Gross per 24 hour  Intake 701.24 ml  Output --  Net 701.24 ml    Wt Readings from Last 3 Encounters:  10/07/23 84.4 kg  09/20/23 89.8 kg  09/18/23 89.1 kg    Examination: Constitutional: NAD Eyes: lids and conjunctivae normal, no scleral icterus ENMT: mmm Neck: normal, supple Respiratory: Diminished breath sounds on the right lung, no wheezing Cardiovascular: Regular rate and rhythm, no murmurs / rubs / gallops. No LE edema. Abdomen: soft, no distention, no tenderness. Bowel sounds positive.  Skin: no rashes   Data Reviewed: I have independently reviewed following labs and imaging studies   CBC Recent Labs  Lab 10/06/23 1359 10/07/23 0523 10/10/23 0206  WBC 9.9 10.1 19.7*  HGB 13.6 13.1 13.1  HCT 42.3 39.9 39.9  PLT 240 229 301  MCV 88.3 87.9 87.3  MCH 28.4 28.9 28.7  MCHC 32.2 32.8 32.8  RDW 14.8 14.7 14.9  LYMPHSABS 1.0  --   --   MONOABS 1.5*  --   --   EOSABS 0.3  --   --   BASOSABS 0.1  --   --     Recent Labs  Lab 10/06/23 1359 10/06/23 1400 10/07/23 0523 10/08/23 0244 10/09/23 0258 10/10/23 0206  NA  --  133* 133* 133* 132* 134*  K  --  3.4* 3.6 3.6 3.6 3.9  CL  --  91* 96* 96* 95* 96*  CO2  --  25 24 24 22  21*  GLUCOSE  --  237* 154* 170* 212* 172*  BUN  --  30* 42* 30* 47* 62*  CREATININE  --  8.08* 9.42* 7.66* 10.09* 12.07*  CALCIUM   --  9.5 9.0 8.6* 8.4* 8.4*  AST  --  24  --   --   --   --   ALT  --  17  --   --   --   --   ALKPHOS  --  101  --   --   --   --   BILITOT  --  0.5  --   --   --   --   ALBUMIN   --  2.7* 2.4* 2.4* 2.2* 2.2*  PROCALCITON  --   --   --   --   --  2.96  BNP 526.2*  --   --   --   --   --     ------------------------------------------------------------------------------------------------------------------ No results for input(s): CHOL, HDL, LDLCALC, TRIG, CHOLHDL, LDLDIRECT in the last 72 hours.  Lab Results  Component Value Date   HGBA1C 6.7 (A) 09/20/2023    ------------------------------------------------------------------------------------------------------------------ No results for input(s): TSH, T4TOTAL, T3FREE, THYROIDAB in the last 72 hours.  Invalid input(s): FREET3  Cardiac Enzymes No results for input(s): CKMB, TROPONINI, MYOGLOBIN in the last 168 hours.  Invalid input(s): CK ------------------------------------------------------------------------------------------------------------------    Component Value Date/Time   BNP 526.2 (H) 10/06/2023 1359    CBG: Recent Labs  Lab 10/09/23 1144 10/09/23 1711 10/09/23 2210 10/10/23 0035 10/10/23 0831  GLUCAP 185* 205* 159* 155* 171*    Recent Results (from the past 240 hours)  Resp panel by RT-PCR (RSV, Flu A&B, Covid) Anterior Nasal Swab     Status: None   Collection Time: 10/06/23  1:59 PM   Specimen: Anterior Nasal Swab  Result Value Ref Range Status   SARS Coronavirus 2 by RT PCR NEGATIVE NEGATIVE Final   Influenza A by PCR NEGATIVE NEGATIVE Final   Influenza B by PCR NEGATIVE NEGATIVE Final    Comment: (NOTE) The Xpert Xpress SARS-CoV-2/FLU/RSV plus assay is intended as an aid in the diagnosis of influenza from Nasopharyngeal swab specimens and should not be used as a sole basis for treatment. Nasal washings and aspirates are unacceptable for Xpert Xpress SARS-CoV-2/FLU/RSV testing.  Fact Sheet for Patients: BloggerCourse.com  Fact Sheet for Healthcare Providers: SeriousBroker.it  This test is not yet approved or cleared by the [place]  FDA and has been authorized for detection and/or diagnosis of SARS-CoV-2 by FDA under an Emergency Use Authorization (EUA). This EUA will remain in effect (meaning this test can be used) for the duration of the COVID-19 declaration under Section 564(b)(1) of the Act, 21 U.S.C. section 360bbb-3(b)(1), unless the authorization is terminated or revoked.      Resp Syncytial Virus by PCR NEGATIVE NEGATIVE Final    Comment: (NOTE) Fact Sheet for Patients: BloggerCourse.com  Fact Sheet for Healthcare Providers: SeriousBroker.it  This test is not yet approved or cleared by the [place]  FDA and has been authorized for detection and/or diagnosis of SARS-CoV-2 by FDA under an Emergency Use Authorization (EUA). This EUA will remain in effect (meaning this test can be used) for the duration of the COVID-19 declaration under Section 564(b)(1) of the Act, 21 U.S.C. section 360bbb-3(b)(1), unless the authorization is terminated or revoked.  Performed at [facility], [address] [place], [place] [address]   Pleural fluid culture w Gram Stain     Status: None (Preliminary result)   Collection Time: 10/09/23  2:15 PM   Specimen: Pleural Fluid  Result Value Ref Range Status   Specimen Description PLEURAL  Final   Special Requests NONE  Final   Gram Stain   Final    FEW WBC PRESENT,BOTH PMN AND MONONUCLEAR NO ORGANISMS SEEN Performed at [facility], [address], [place], [place] [address]    Culture PENDING  Incomplete   Report Status PENDING  Incomplete     Radiology Studies: DG CHEST PORT 1 VIEW Result Date: 10/10/2023 CLINICAL DATA:  100030. Leukocytosis and pleural effusion. End-stage renal disease. Hypotension. EXAM: PORTABLE CHEST 1 VIEW COMPARISON:  Chest CT 10/08/2023. FINDINGS: 6:40 a.m. External defibrillator with left chest power source and presternal wire insertion are unchanged as well as a right IJ dialysis catheter with the tip in the right atrium. Moderate-to-large loculated right pleural effusion with overlying lung opacities, appears increased from 2 days ago. Extensive nodular pleural metastatic disease on the right is better seen with CT, not well demonstrated radiographically. Left lung remains clear. Stable mediastinum. There is aortic atherosclerosis.  The cardiac size is normal. No new osseous finding. IMPRESSION: 1. Moderate-to-large loculated right pleural effusion with  overlying lung opacities, appears increased from 2 days ago. 2. Extensive nodular pleural metastatic disease on the right is better seen with CT, not well demonstrated radiographically. 3. Aortic atherosclerosis. Electronically Signed   By: Francis Quam M.D.   On: 10/10/2023 06:54     Nilda Fendt, MD, PhD [facility]  Between 7 am - 7 pm I am available, please contact me via Amion (for emergencies) or Securechat (non urgent messages)  Between 7 pm - 7 am I am not available, please contact night coverage MD/APP via Amion

## 2023-10-10 NOTE — Care Management Important Message (Signed)
 Important Message  Patient Details  Name: Anthony Bullock MRN: [id number] Date of Birth: Aug 21, 1960   Important Message Given:  Yes - Medicare IM     Jon Cruel 10/10/2023, 4:20 PM

## 2023-10-10 NOTE — Progress Notes (Signed)
 [facility] Progress Note   Subjective:   Seen in room - RN at bedside, adjusting him after he reported feeling more dyspneic and coughing. RN reports he vomited his milk this AM. Also, had RR called last night - hypotension and lethargy, treated with IVF bolus and midodrine  10mg . CXR done at 6:43am, showed persistent/increased R effusion.  Objective Vitals:   10/10/23 0101 10/10/23 0336 10/10/23 0539 10/10/23 0803  BP: (!) 101/42 (!) 96/41 (!) 98/39 (!) 101/38  Pulse:  65 67 73  Resp:  14 16   Temp:   99.3 F (37.4 C)   TempSrc:   Oral   SpO2: 98% 95% 96% 93%  Weight:      Height:       Physical Exam General: More ill appearing man, NAD. Coughing, Esko O2 in place. Heart: RRR Lungs: Dull R base, faint rales L base Abdomen: soft Extremities: no LE edema, B BKA Dialysis Access:  TDC in R chest  Additional Objective Labs: Basic Metabolic Panel: Recent Labs  Lab 10/08/23 0244 10/09/23 0258 10/10/23 0206  NA 133* 132* 134*  K 3.6 3.6 3.9  CL 96* 95* 96*  CO2 24 22 21*  GLUCOSE 170* 212* 172*  BUN 30* 47* 62*  CREATININE 7.66* 10.09* 12.07*  CALCIUM  8.6* 8.4* 8.4*  PHOS 3.3 3.9 5.2*   Liver Function Tests: Recent Labs  Lab 10/06/23 1400 10/07/23 0523 10/08/23 0244 10/09/23 0258 10/10/23 0206  AST 24  --   --   --   --   ALT 17  --   --   --   --   ALKPHOS 101  --   --   --   --   BILITOT 0.5  --   --   --   --   PROT 7.1  --   --   --   --   ALBUMIN  2.7*   < > 2.4* 2.2* 2.2*   < > = values in this interval not displayed.   Recent Labs  Lab 10/06/23 1400  LIPASE 25   CBC: Recent Labs  Lab 10/06/23 1359 10/07/23 0523 10/10/23 0206  WBC 9.9 10.1 19.7*  NEUTROABS 7.0  --   --   HGB 13.6 13.1 13.1  HCT 42.3 39.9 39.9  MCV 88.3 87.9 87.3  PLT 240 229 301   Blood Culture    Component Value Date/Time   SDES PLEURAL 10/09/2023 1415   SPECREQUEST NONE 10/09/2023 1415   CULT PENDING 10/09/2023 1415   REPTSTATUS PENDING 10/09/2023 1415    Studies/Results: DG CHEST PORT 1 VIEW Result Date: 10/10/2023 CLINICAL DATA:  100030. Leukocytosis and pleural effusion. End-stage renal disease. Hypotension. EXAM: PORTABLE CHEST 1 VIEW COMPARISON:  Chest CT 10/08/2023. FINDINGS: 6:40 a.m. External defibrillator with left chest power source and presternal wire insertion are unchanged as well as a right IJ dialysis catheter with the tip in the right atrium. Moderate-to-large loculated right pleural effusion with overlying lung opacities, appears increased from 2 days ago. Extensive nodular pleural metastatic disease on the right is better seen with CT, not well demonstrated radiographically. Left lung remains clear. Stable mediastinum. There is aortic atherosclerosis. The cardiac size is normal. No new osseous finding. IMPRESSION: 1. Moderate-to-large loculated right pleural effusion with overlying lung opacities, appears increased from 2 days ago. 2. Extensive nodular pleural metastatic disease on the right is better seen with CT, not well demonstrated radiographically. 3. Aortic atherosclerosis. Electronically Signed   By: Francis Beatriz HERO.D.  On: 10/10/2023 06:54   CT CHEST W CONTRAST Result Date: 10/09/2023 CLINICAL DATA:  Possible pulmonary mass on recent chest x-ray EXAM: CT CHEST WITH CONTRAST TECHNIQUE: Multidetector CT imaging of the chest was performed during intravenous contrast administration. RADIATION DOSE REDUCTION: This exam was performed according to the departmental dose-optimization program which includes automated exposure control, adjustment of the mA and/or kV according to patient size and/or use of iterative reconstruction technique. CONTRAST:  75mL OMNIPAQUE  IOHEXOL  350 MG/ML SOLN COMPARISON:  Chest x-ray earlier same day FINDINGS: Cardiovascular: The heart size is normal. No substantial pericardial effusion. Coronary artery calcification is evident. Moderate atherosclerotic calcification is noted in the wall of the thoracic aorta.  Right IJ central line tip is positioned at the SVC/RA junction. Extracardiac subcutaneous implantable defibrillator evident. Mediastinum/Nodes: 13 mm short axis paraesophageal lymph node seen anterior to the spine on 68/3. Upper normal right hilar lymph nodes. No left hilar lymphadenopathy. Juxta diaphragmatic ingested cardiac lymphadenopathy is seen in the anterior right chest there is no axillary lymphadenopathy. The esophagus has normal imaging features. Lungs/Pleura: Centrilobular and paraseptal emphysema evident. 17 mm ground-glass opacity identified anterior left upper lobe on 39/4. 5 mm left lower lobe nodule seen on 119/4. Subsegmental atelectasis noted left base. Volume loss noted right upper and middle lobes with more pronounced collapse/consolidation in the right lower lobe. Areas of loculated pleural fluid are seen in the upper right hemithorax. Extensive pleural nodularity is seen in the right chest including plaque-like nodular pleural thickening inferolaterally on 95/3. Posterior right pleural nodule measures 4.0 x 1.0 cm on 128/3. Confluent soft tissue is seen in the anterior and posterior pleural reflections. The anterolateral pleural disease extends into the right chest wall (see image 101/3) with pathologic fracture of the anterior right fifth rib and destruction of the anterior right sixth and seventh ribs. Upper Abdomen: Visualized portion of the upper abdomen shows no acute findings. Musculoskeletal: See above for bony changes in anterior right fifth through seventh ribs. Otherwise no suspicious lytic or sclerotic osseous abnormality. IMPRESSION: 1. Extensive pleural nodularity in the right chest including plaque-like nodular pleural thickening inferolaterally. The anterolateral pleural disease extends into the right chest wall with pathologic fracture of the anterior right fifth rib and destruction of the anterior right sixth and seventh ribs. Imaging features are highly suspicious for  malignancy. 2. 17 mm ground-glass opacity anterior left upper lobe. This is nonspecific and may be infectious/inflammatory. 3. 5 mm left lower lobe pulmonary nodule. 4. 13 mm short axis paraesophageal lymph node anterior to the spine. Metastatic disease a concern. 5. Aortic Atherosclerosis (ICD10-I70.0) and Emphysema (ICD10-J43.9). Electronically Signed   By: Camellia Candle M.D.   On: 10/09/2023 06:58   US  THORACENTESIS ASP PLEURAL SPACE W/IMG GUIDE Result Date: 10/08/2023 INDICATION: 36304 Hypoxemia 36304 History of ESRD, CHF. Currently in hospital with hypoxemia due to right pleural effusion. Had thoracentesis 2 days ago with 1.85 L removed. Found to have recurrent increasing right pleural effusion on chest x-ray today. Consulted for repeat right thoracentesis. EXAM: ULTRASOUND GUIDED RIGHT THORACENTESIS MEDICATIONS: 10 mL 1% lidocaine  COMPLICATIONS: None immediate. PROCEDURE: An ultrasound guided thoracentesis was thoroughly discussed with the patient and questions answered. The benefits, risks, alternatives and complications were also discussed. The patient understands and wishes to proceed with the procedure. Written consent was obtained. Ultrasound was performed to localize and mark an adequate pocket of fluid in the RIGHT chest. The area was then prepped and draped in the normal sterile fashion. 1% Lidocaine  was used for local  anesthesia. Under ultrasound guidance a 6 Fr Safe-T-Centesis catheter was introduced. Thoracentesis was performed. The catheter was removed and a dressing applied. FINDINGS: A total of approximately 520 mL of amber fluid was removed. Samples were sent to the laboratory as requested by the clinical team. IMPRESSION: Successful ultrasound guided RIGHT thoracentesis yielding 520 mL of pleural fluid. Performed and dictated by Kimble Clas, PA-C under direct supervision of Thom Hall, MD Electronically Signed   By: Thom Hall M.D.   On: 10/08/2023 14:20   DG Chest 1 View Result  Date: 10/08/2023 CLINICAL DATA:  [id number] S/P thoracentesis [id number] EXAM: CHEST  1 VIEW COMPARISON:  October 08, 2023 FINDINGS: The cardiomediastinal silhouette is unchanged in contour.RIGHT chest CVC with poor visualization of the tip of the CVC secondary to underpenetration. This favored to project over the RIGHT atrium. LEFT chest AICD. Moderate RIGHT-sided pleural effusion, decreased from prior. No significant pneumothorax. There is a persistent rounded opacification along the RIGHT lateral lung base. IMPRESSION: 1. Moderate RIGHT-sided pleural effusion, decreased from prior. No significant pneumothorax. 2. Persistent rounded opacification along the RIGHT lateral lung base. This could reflect a loculated component of the pleural effusion versus underlying mass. Recommend correlation with thoracocentesis fluid results with consideration of dedicated CT chest with contrast if clinically indicated. Electronically Signed   By: Corean Salter M.D.   On: 10/08/2023 13:07   ECHOCARDIOGRAM COMPLETE Result Date: 10/08/2023    ECHOCARDIOGRAM REPORT   Patient Name:   Anthony Bullock Date of Exam: 10/08/2023 Medical Rec #:  [id number]     Height:       70.0 in Accession #:    [id number]    Weight:       186.1 lb Date of Birth:  [birth year]     BSA:          2.024 m Patient Age:    63 years      BP:           101/51 mmHg Patient Gender: M             HR:           73 bpm. Exam Location:  Inpatient Procedure: 2D Echo, Cardiac Doppler and Color Doppler (Both Spectral and Color            Flow Doppler were utilized during procedure). Indications:    CHF - Acute Diastolic I50.31  History:        Patient has prior history of Echocardiogram examinations, most                 recent 04/11/2023. CHF and Cardiomyopathy, CAD and Previous                 Myocardial Infarction, ESRD, PAD and COPD, Arrythmias:Atrial                 Fibrillation, Signs/Symptoms:Hypotension and Chest Pain; Risk                 Factors:Hypertension,  Dyslipidemia and Diabetes.  Sonographer:    Thea Norlander RCS Referring Phys: COSTIN M GHERGHE IMPRESSIONS  1. Left ventricular ejection fraction, by estimation, is 30 to 35%. The left ventricle has moderately decreased function. The left ventricle demonstrates global hypokinesis. Left ventricular diastolic parameters are consistent with Grade I diastolic dysfunction (impaired relaxation).  2. Right ventricular systolic function is normal. The right ventricular size is normal.  3. The mitral valve is normal in structure. No evidence of mitral valve regurgitation. No  evidence of mitral stenosis.  4. The aortic valve is calcified. Aortic valve regurgitation is not visualized. Aortic valve sclerosis is present, with no evidence of aortic valve stenosis.  5. The inferior vena cava is normal in size with greater than 50% respiratory variability, suggesting right atrial pressure of 3 mmHg. FINDINGS  Left Ventricle: Left ventricular ejection fraction, by estimation, is 30 to 35%. The left ventricle has moderately decreased function. The left ventricle demonstrates global hypokinesis. The left ventricular internal cavity size was normal in size. There is no left ventricular hypertrophy. Left ventricular diastolic parameters are consistent with Grade I diastolic dysfunction (impaired relaxation).  LV Wall Scoring: The posterior wall is akinetic. Right Ventricle: The right ventricular size is normal. No increase in right ventricular wall thickness. Right ventricular systolic function is normal. Left Atrium: Left atrial size was normal in size. Right Atrium: Right atrial size was normal in size. Pericardium: There is no evidence of pericardial effusion. Mitral Valve: The mitral valve is normal in structure. No evidence of mitral valve regurgitation. No evidence of mitral valve stenosis. Tricuspid Valve: The tricuspid valve is normal in structure. Tricuspid valve regurgitation is not demonstrated. No evidence of tricuspid  stenosis. Aortic Valve: The aortic valve is calcified. Aortic valve regurgitation is not visualized. Aortic valve sclerosis is present, with no evidence of aortic valve stenosis. Aortic valve peak gradient measures 2.7 mmHg. Pulmonic Valve: The pulmonic valve was normal in structure. Pulmonic valve regurgitation is not visualized. No evidence of pulmonic stenosis. Aorta: The aortic root is normal in size and structure. Venous: The inferior vena cava is normal in size with greater than 50% respiratory variability, suggesting right atrial pressure of 3 mmHg. IAS/Shunts: No atrial level shunt detected by color flow Doppler.  LEFT VENTRICLE PLAX 2D LVIDd:         5.40 cm   Diastology LVIDs:         4.30 cm   LV e' medial:    4.46 cm/s LV PW:         0.90 cm   LV E/e' medial:  8.9 LV IVS:        1.00 cm   LV e' lateral:   5.22 cm/s LVOT diam:     2.20 cm   LV E/e' lateral: 7.6 LV SV:         50 LV SV Index:   25 LVOT Area:     3.80 cm  RIGHT VENTRICLE            IVC RV S prime:     9.57 cm/s  IVC diam: 1.20 cm TAPSE (M-mode): 1.9 cm LEFT ATRIUM           Index        RIGHT ATRIUM          Index LA diam:      3.20 cm 1.58 cm/m   RA Area:     8.68 cm LA Vol (A2C): 26.9 ml 13.29 ml/m  RA Volume:   13.70 ml 6.77 ml/m LA Vol (A4C): 22.1 ml 10.92 ml/m  AORTIC VALVE AV Area (Vmax): 3.13 cm AV Vmax:        82.00 cm/s AV Peak Grad:   2.7 mmHg LVOT Vmax:      67.60 cm/s LVOT Vmean:     46.100 cm/s LVOT VTI:       0.132 m  AORTA Ao Root diam: 3.50 cm Ao Asc diam:  2.90 cm MITRAL VALVE MV Area (PHT): 4.06 cm  SHUNTS MV Decel Time: 187 msec    Systemic VTI:  0.13 m MV E velocity: 39.80 cm/s  Systemic Diam: 2.20 cm MV A velocity: 97.50 cm/s MV E/A ratio:  0.41 Oneil Parchment MD Electronically signed by Oneil Parchment MD Signature Date/Time: 10/08/2023/12:32:34 PM    Final    Medications:  azithromycin  500 mg (10/10/23 0846)   cefTRIAXone  (ROCEPHIN )  IV 1 g (10/10/23 1009)    amiodarone   200 mg Oral Daily   atorvastatin   80  mg Oral QHS   Chlorhexidine  Gluconate Cloth  6 each Topical Q0600   clopidogrel   75 mg Oral Q breakfast   feeding supplement (NEPRO CARB STEADY)  237 mL Oral BID BM   insulin  aspart  0-9 Units Subcutaneous TID WC   insulin  glargine  16 Units Subcutaneous QHS   midodrine   10 mg Oral Q T,Th,Sa-HD   multivitamin  1 tablet Oral Daily   saccharomyces boulardii  250 mg Oral BID   sodium chloride  flush  3 mL Intravenous Q12H    Dialysis Orders TTS - [facility] 4 hours, 400/500, EDW 82.5kg, 1K/2.5Ca, TDC,  no heparin  - Calcitriol  0.20mcg PO q HD - Cinacalcet  90mg  PO q HD   Assessment/Plan: New R pleural effusion: S/p 1.8L thoracentesis on 9/26, unfortunately fluid not sent for Cx. Repeat thora 9/28 with ~513mL out. C/w exudative effusion. CXR after showed possible R lung mass, followed by CT showing extensive pleural nodularity with extension into anterior ribs with pathological Fx, c/w malignancy. Pulm following, awaiting cytology from last thora - may need lung biopsy. Leukocytosis: WBC up today, ?pneumonia. IV abx started. ESRD: Continue HD on TTS schedule - for HD today. Unclear how will tolerate - follow closely. HTN/volume: BP lower today, uses mido pre-HD. No LE edema.  Anemia of ESRD: Hgb 13, no ESA needed - contraindicated at this time in setting of presumed cancer Dx. Secondary HPTH: CorrCa/Phos ok - not getting binders here - follow for now. Nutrition: Alb low, continue supplements. A-fib T2DM   Izetta Boehringer, PA-C 10/10/2023, 10:13 AM  [employer]

## 2023-10-10 NOTE — Progress Notes (Signed)
 Wife called reporting patient is c/o SOB. Patient slid up on the bed upright position. O2 95% on 2L nasal cannula.  Patient reports breathing better after positioning.

## 2023-10-10 NOTE — Progress Notes (Signed)
 Received patient in bed.Alert and oriented x 4. Consent verified.  Access used : Right hd catheter that worked well.Dressing on date.  Duration of treatment: 3.5 hours.  Uf goal. : 1 L  Medicine given: Midodrine  10 mg.  Hand off to the night HD nurse with stable condition.

## 2023-10-10 NOTE — Plan of Care (Signed)
 Patient at HD.  Problem: Education: Goal: Ability to describe self-care measures that may prevent or decrease complications (Diabetes Survival Skills Education) will improve Outcome: Progressing Goal: Individualized Educational Video(s) Outcome: Progressing   Problem: Coping: Goal: Ability to adjust to condition or change in health will improve Outcome: Progressing   Problem: Fluid Volume: Goal: Ability to maintain a balanced intake and output will improve Outcome: Progressing   Problem: Health Behavior/Discharge Planning: Goal: Ability to identify and utilize available resources and services will improve Outcome: Progressing Goal: Ability to manage health-related needs will improve Outcome: Progressing

## 2023-10-10 NOTE — Significant Event (Addendum)
 Rapid Response Event Note   Reason for Call :  Decreased LOC, hypotension  Initial Focused Assessment:  Pt lying in bed with eyes closed, in no visible distress. He awakens to voice and is oriented and able to answer questions. He c/o SOB/CP and generally not feeling well. He is sleepy. Lungs clear, diminished on R side. Skin cool/clammy.  T-99.2, HR-66, BP-89/39>101/41, RR-20, SpO2-98% on 2L [place].  Interventions:  10mg  midodrine  given at 2338 and additional 10mg  given at 0035 250cc NS bolus CBG-155 Plan of Care:  BP increasing with interventions. Pt sleepy but arousable and able to speak/interact. Finish bolus and allow time for midodrine  to work. Continue to monitor pt closely. Call RRT if further assistance needed.  Event Summary:   MD Notified: Dr. Shona notified by bedside RN PTA RRT  Call Time:0030 Arrival Time:0037 End Time:0102  Tish Graeme Piety, RN

## 2023-10-10 NOTE — Consult Note (Cosign Needed)
 [address]       [place] [address]             [phone]        Anthony Bullock [facility] Medical Record #[id number] Date of Birth: 1960-01-12  Referring: No ref. provider found Primary Care: Anthony Wolm LELON, MD Primary Cardiologist:Anthony Bullock, Dorn, MD  Reason for Consult:  Request for assistance with diagnostic work up for recurrent right pleural effusion and right chest wall mass.   History of Present Illness:     [name] is a pleasant 63 year old male with extensive past medical history including end-stage renal disease on hemodialysis Tuesday-Thursday-Saturday, former 64-pack-year smoking history having quit about 13 years ago, chronic obstructive pulmonary disease, peripheral vascular diseases/p bilateral BKA's,  paroxysmal atrial fibrillation on Eliquis , coronary artery disease status post PTCI with coronary stenting in April of this year and subsequent C. difficile colonic infection requiring blood transfusions.  He worked in a [employer] for several years and had extensive exposure to light fiber.  He recently reported shortness of breath during dialysis session and had a chest x-ray on 10/05/2023 showing a significant right pleural effusion.  He was referred to interventional radiology for thoracentesis yielding 1850 mL fluid.  He had recurrent effusion on follow-up chest x-ray and had a subsequent thoracentesis on 10/08/2023 yielding 520 mL of fluid.  There were no studies sent on the initial specimen but cytology on the fluid obtained on 10/08/2023 showed no malignancy but had reactive mesothelial cells.  [name] was admitted to the hospital for further evaluation.  He has had persistent pain in his right chest radiating to the right shoulder.  He reports decreased appetite over the last several weeks and also reports about a 12 pound intentional weight loss since May when he decided to stop eating lunch on most days. CT surgery has been engaged  to assist with diagnostic workup with possible video-assisted thoracoscopy for biopsy. Since hospital admission, [name] has been relatively hypotensive with BP currently 90-100 systolic and supported with midodrine . O2 sat is 93% on 2L Anthony Bullock O2. His temperature is 99 and his WBC has increased from 10,000-> 19,000 over th past 3 days. He has been started on empiric broad-spectrum antibiotic coverage.   Current Activity/ Functional Status: Limited mobility, pt is s/p bilateral BKA's.    Zubrod Score: At the time of surgery this patient's most appropriate activity status/level should be described as: []     0    Normal activity, no symptoms []     1    Restricted in physical strenuous activity but ambulatory, able to do out light work []     2    Ambulatory and capable of self care, unable to do work activities, up and about                 more than 50%  Of the time                            [x]     3    Only limited self care, in bed greater than 50% of waking hours []     4    Completely disabled, no self care, confined to bed or chair []     5    Moribund  Past Medical History:  Diagnosis Date   AICD (automatic cardioverter/defibrillator) present    [employer]   Allergic rhinitis  Anemia    Arthritis    Chronic systolic heart failure (HCC)    a. ECHO (12/2012) EF 25-30%, HK entireanteroseptal myocardium //  b.  EF 25%, diffuse HK, grade 1 diastolic dysfunction, MAC, mild LAE, normal RVSF, trivial pericardial effusion   COPD (chronic obstructive pulmonary disease) (HCC)    Diabetes mellitus type II    Diabetic nephropathy (HCC)    Diabetic neuropathy (HCC)    ESRD on hemodialysis (HCC)    started HD June 2017, goes to [facility] HD unit, Dr Anthony Bullock   History of cardiac catheterization    a.Myoview  1/15:  There is significant left ventricular dysfunction. There may be slight scar at the apex. There is no significant ischemia. LV Ejection Fraction: 27%  //  b. RHC/LHC (1/15) with  mean RA 6, PA 47/22 mean 33, mean PCWP 20, PVR 2.5 WU, CI 2.5; 80% dLAD stenosis, 70% diffuse large D.     History of kidney stones    Hyperlipidemia    Hypertension    Kidney stones    NICM (nonischemic cardiomyopathy) (HCC)    Primarily nonischemic.  Echo (12/14) with EF 25-30%.  Echo (3/15) with EF 25%, mild to moderately dilated LV, normal RV size and systolic function.     Osteomyelitis (HCC)    left fifth ray   Pneumonia    Urethral stricture    Wears glasses     Past Surgical History:  Procedure Laterality Date   ABDOMINAL AORTOGRAM W/LOWER EXTREMITY N/A 03/30/2016   Procedure: Abdominal Aortogram w/Lower Extremity;  Surgeon: Lonni GORMAN Blade, MD;  Location: [facility] INVASIVE CV LAB;  Service: Cardiovascular;  Laterality: N/A;   AMPUTATION Right 04/26/2016   Procedure: Right Below Knee Amputation;  Surgeon: Jerona Harden GAILS, MD;  Location: [facility] OR;  Service: Orthopedics;  Laterality: Right;   AMPUTATION Left 08/21/2019   Procedure: LEFT FOOT 5TH RAY AMPUTATION;  Surgeon: Harden Jerona GAILS, MD;  Location: [facility] OR;  Service: Orthopedics;  Laterality: Left;   AMPUTATION Left 11/13/2019   Procedure: LEFT BELOW KNEE AMPUTATION;  Surgeon: Harden Jerona GAILS, MD;  Location: [facility] OR;  Service: Orthopedics;  Laterality: Left;   AV FISTULA PLACEMENT Right 09/08/2015   Procedure: INSERTION OF 4-46mm x 45cm  ARTERIOVENOUS (AV) GORE-TEX GRAFT RIGHT UPPER  ARM;  Surgeon: Lonni GORMAN Blade, MD;  Location: [facility] OR;  Service: Vascular;  Laterality: Right;   AV FISTULA PLACEMENT Left 01/14/2016   Procedure: CREATION OF LEFT UPPER ARM ARTERIOVENOUS FISTULA;  Surgeon: Lonni GORMAN Blade, MD;  Location: [facility] OR;  Service: Vascular;  Laterality: Left;   BASCILIC VEIN TRANSPOSITION Right 08/22/2014   Procedure: RIGHT UPPER ARM BASCILIC VEIN TRANSPOSITION;  Surgeon: Lonni GORMAN Blade, MD;  Location: [facility] OR;  Service: Vascular;  Laterality: Right;   BELOW KNEE LEG AMPUTATION Right 04/26/2016   BIOPSY OF SKIN SUBCUTANEOUS  TISSUE AND/OR MUCOUS MEMBRANE  04/28/2023   Procedure: BIOPSY, SKIN, SUBCUTANEOUS TISSUE, OR MUCOUS MEMBRANE;  Surgeon: Shila Gustav GAILS, MD;  Location: [facility] ENDOSCOPY;  Service: Gastroenterology;;   CARDIAC CATHETERIZATION     CARDIAC DEFIBRILLATOR PLACEMENT  06/27/2013   Sub Q       BY DR FERNANDE   CATARACT EXTRACTION W/PHACO Right 08/06/2018   Procedure: CATARACT EXTRACTION PHACO AND INTRAOCULAR LENS PLACEMENT (IOC);  Surgeon: Harrie Agent, MD;  Location: [facility] ORS;  Service: Ophthalmology;  Laterality: Right;  CDE: 4.06   CATARACT EXTRACTION W/PHACO Left 08/20/2018   Procedure: CATARACT EXTRACTION PHACO AND INTRAOCULAR LENS PLACEMENT (IOC);  Surgeon: Harrie,  Lynwood, MD;  Location: [facility] ORS;  Service: Ophthalmology;  Laterality: Left;  CDE: 6.76   COLONOSCOPY WITH PROPOFOL  N/A 07/22/2015   Procedure: COLONOSCOPY WITH PROPOFOL ;  Surgeon: Victory LITTIE Legrand DOUGLAS, MD;  Location: [facility] ENDOSCOPY;  Service: Gastroenterology;  Laterality: N/A;   CORONARY LITHOTRIPSY N/A 04/14/2023   Procedure: CORONARY LITHOTRIPSY;  Surgeon: Anner Alm ORN, MD;  Location: [facility] INVASIVE CV LAB;  Service: Cardiovascular;  Laterality: N/A;  RCA   CORONARY STENT INTERVENTION N/A 04/12/2023   Procedure: CORONARY STENT INTERVENTION;  Surgeon: Swaziland, Peter M, MD;  Location: [facility] INVASIVE CV LAB;  Service: Cardiovascular;  Laterality: N/A;   CORONARY STENT INTERVENTION N/A 04/14/2023   Procedure: CORONARY STENT INTERVENTION;  Surgeon: Anner Alm ORN, MD;  Location: [facility] INVASIVE CV LAB;  Service: Cardiovascular;  Laterality: N/A;   ESOPHAGOGASTRODUODENOSCOPY N/A 04/28/2023   Procedure: EGD (ESOPHAGOGASTRODUODENOSCOPY);  Surgeon: Nandigam, Kavitha V, MD;  Location: [facility] ENDOSCOPY;  Service: Gastroenterology;  Laterality: N/A;   FEMORAL-POPLITEAL BYPASS GRAFT Right 03/31/2016   Procedure: BYPASS GRAFT FEMORAL-POPLITEAL ARTERY USING RIGHT GREATER SAPHENOUS NONREVERSED VEIN;  Surgeon: Lonni GORMAN Blade, MD;  Location: [facility] OR;  Service: Vascular;   Laterality: Right;   HERNIA REPAIR     I & D EXTREMITY Right 03/31/2016   Procedure: IRRIGATION AND DEBRIDEMENT FOOT;  Surgeon: Lonni GORMAN Blade, MD;  Location: [facility] OR;  Service: Vascular;  Laterality: Right;   IMPLANTABLE CARDIOVERTER DEFIBRILLATOR IMPLANT N/A 06/27/2013   Procedure: SUB Q ICD;  Surgeon: Elspeth JAYSON Sage, MD;  Location: [facility] CATH LAB;  Service: Cardiovascular;  Laterality: N/A;   INTRAOPERATIVE ARTERIOGRAM Right 03/31/2016   Procedure: INTRA OPERATIVE ARTERIOGRAM;  Surgeon: Lonni GORMAN Blade, MD;  Location: [facility] OR;  Service: Vascular;  Laterality: Right;   IR GENERIC HISTORICAL Right 11/30/2015   IR THROMBECTOMY AV FISTULA W/THROMBOLYSIS/PTA INC/SHUNT/IMG RIGHT 11/30/2015 Marcey Moan, MD [facility]   IR GENERIC HISTORICAL  11/30/2015   IR US  GUIDE VASC ACCESS RIGHT 11/30/2015 Marcey Moan, MD [facility]   IR GENERIC HISTORICAL Right 12/15/2015   IR THROMBECTOMY AV FISTULA W/THROMBOLYSIS/PTA/STENT INC/SHUNT/IMG RT 12/15/2015 Toribio Faes, MD [facility]   IR GENERIC HISTORICAL  12/15/2015   IR US  GUIDE VASC ACCESS RIGHT 12/15/2015 Toribio Faes, MD [facility]   IR GENERIC HISTORICAL  12/28/2015   IR FLUORO GUIDE CV LINE RIGHT 12/28/2015 Rome Hall, MD [facility]   IR GENERIC HISTORICAL  12/28/2015   IR US  GUIDE VASC ACCESS RIGHT 12/28/2015 Rome Hall, MD [facility]   LEFT A ND RIGHT HEART CATH  01/30/2013   DR CHET   LEFT AND RIGHT HEART CATHETERIZATION WITH CORONARY ANGIOGRAM N/A 01/30/2013   Procedure: LEFT AND RIGHT HEART CATHETERIZATION WITH CORONARY ANGIOGRAM;  Surgeon: Toribio JONELLE Fuel, MD;  Location: [facility] CATH LAB;  Service: Cardiovascular;  Laterality: N/A;   LEFT HEART CATH AND CORONARY ANGIOGRAPHY N/A 04/12/2023   Procedure: LEFT HEART CATH AND CORONARY ANGIOGRAPHY;  Surgeon: Swaziland, Peter M, MD;  Location: [facility] INVASIVE CV LAB;  Service: Cardiovascular;  Laterality: N/A;   PERIPHERAL VASCULAR CATHETERIZATION Right 01/26/2015   Procedure: A/V  Fistulagram;  Surgeon: Lonni GORMAN Blade, MD;  Location: [facility] INVASIVE CV LAB;  Service: Cardiovascular;  Laterality: Right;   reapea urethral surgery for recurrent obstruction  2011   TOTAL KNEE ARTHROPLASTY Right 2007   VEIN HARVEST Right 03/31/2016   Procedure: RIGHT GREATER SAPHENOUS VEIN HARVEST;  Surgeon: Lonni GORMAN Blade, MD;  Location: [facility] OR;  Service: Vascular;  Laterality: Right;    Social History  Tobacco Use  Smoking Status Former   Current packs/day: 0.00   Average packs/day: 2.0 packs/day for 32.0 years (64.0 ttl pk-yrs)   Types: Cigarettes   Start date: 05/11/1978   Quit date: 05/11/2010   Years since quitting: 13.4  Smokeless Tobacco Never    Social History   Substance and Sexual Activity  Alcohol  Use No     Allergies  Allergen Reactions   Epoetin  Alfa Other (See Comments)    Unknown    Ferumoxytol  Other (See Comments)    Unknown    Morphine Sulfate Rash and Other (See Comments)    Itches all over, red spots    Current Facility-Administered Medications  Medication Dose Route Frequency Provider Last Rate Last Admin   acetaminophen  (TYLENOL ) tablet 650 mg  650 mg Oral Q6H PRN Smith, Rondell A, MD   650 mg at 10/06/23 2136   Or   acetaminophen  (TYLENOL ) suppository 650 mg  650 mg Rectal Q6H PRN Claudene Reeves A, MD       albuterol  (PROVENTIL ) (2.5 MG/3ML) 0.083% nebulizer solution 2.5 mg  2.5 mg Nebulization Q6H PRN Claudene, Rondell A, MD       amiodarone  (PACERONE ) tablet 200 mg  200 mg Oral Daily Chavez, Abigail, NP   200 mg at 10/10/23 9160   atorvastatin  (LIPITOR ) tablet 80 mg  80 mg Oral QHS Smith, Rondell A, MD   80 mg at 10/09/23 2249   azithromycin  (ZITHROMAX ) 500 mg in sodium chloride  0.9 % 250 mL IVPB  500 mg Intravenous Q24H Gherghe, Costin M, MD 250 mL/hr at 10/10/23 0846 500 mg at 10/10/23 0846   cefTRIAXone  (ROCEPHIN ) 1 g in sodium chloride  0.9 % 100 mL IVPB  1 g Intravenous Q24H Gherghe, Costin M, MD 200 mL/hr at 10/10/23 1009 1 g at 10/10/23  1009   Chlorhexidine  Gluconate Cloth 2 % PADS 6 each  6 each Topical Q0600 Stovall, Kathryn R, PA-C   6 each at 10/10/23 9367   clopidogrel  (PLAVIX ) tablet 75 mg  75 mg Oral Q breakfast Claudene Reeves A, MD   75 mg at 10/10/23 0839   feeding supplement (NEPRO CARB STEADY) liquid 237 mL  237 mL Oral BID BM Tobie Gordy POUR, MD       insulin  aspart (novoLOG ) injection 0-9 Units  0-9 Units Subcutaneous TID WC Smith, Rondell A, MD   2 Units at 10/10/23 0837   insulin  glargine (LANTUS ) injection 16 Units  16 Units Subcutaneous QHS Claudene Reeves A, MD   16 Units at 10/09/23 2247   midodrine  (PROAMATINE ) tablet 10 mg  10 mg Oral Q T,Th,Sa-HD Andrez Chroman, NP   10 mg at 10/07/23 1030   multivitamin (RENA-VIT) tablet 1 tablet  1 tablet Oral Daily Claudene Reeves A, MD   1 tablet at 10/10/23 0839   ondansetron  (ZOFRAN ) injection 4 mg  4 mg Intravenous Q6H PRN Gherghe, Costin M, MD   4 mg at 10/10/23 9093   oxyCODONE  (Oxy IR/ROXICODONE ) immediate release tablet 5-10 mg  5-10 mg Oral Q4H PRN Gherghe, Costin M, MD   10 mg at 10/09/23 1552   saccharomyces boulardii (FLORASTOR) capsule 250 mg  250 mg Oral BID Gherghe, Costin M, MD   250 mg at 10/10/23 9162   sodium chloride  flush (NS) 0.9 % injection 3 mL  3 mL Intravenous Q12H Smith, Rondell A, MD   3 mL at 10/10/23 1014    Medications Prior to Admission  Medication Sig Dispense Refill Last Dose/Taking   acetaminophen  (TYLENOL )  325 MG tablet Take 1-2 tablets (325-650 mg total) by mouth every 4 (four) hours as needed for mild pain.   10/05/2023 Evening   amiodarone  (PACERONE ) 200 MG tablet TAKE 1 TABLET ( 200 MG ) BY MOUTH TWICE DAILY FOR 20 DAYS, THEN 1 TABLET ( 200 MG TOTAL ) ONCE DAILY (Patient taking differently: Take 200 mg by mouth daily.) 90 tablet 0 10/06/2023 Morning   apixaban  (ELIQUIS ) 5 MG TABS tablet Take 1 tablet (5 mg total) by mouth 2 (two) times daily. 60 tablet 1 10/06/2023 at  9:00 AM   atorvastatin  (LIPITOR ) 80 MG tablet Take 1 tablet (80 mg  total) by mouth at bedtime. 90 tablet 3 10/05/2023 Bedtime   carvedilol  (COREG ) 6.25 MG tablet Take 1 tablet (6.25 mg total) by mouth 2 (two) times daily. 180 tablet 3 10/06/2023 Morning   cinacalcet  (SENSIPAR ) 30 MG tablet Take 1 tablet (30 mg total) by mouth Every Tuesday,Thursday,and Saturday with dialysis. 60 tablet  10/05/2023 Morning   clopidogrel  (PLAVIX ) 75 MG tablet Take 1 tablet (75 mg total) by mouth daily with breakfast. 30 tablet 2 10/06/2023 Morning   gabapentin  (NEURONTIN ) 300 MG capsule Take 1 capsule (300 mg total) by mouth at bedtime. 30 capsule 2 10/05/2023 Bedtime   HUMALOG  KWIKPEN 200 UNIT/ML KwikPen INJECT A MAXIMUM OF 28 UNITS SUBCUTANEOUSLY TWICE DAILY WITH LUNCH AND SUPPER PER SLIDING SCALE. APPOINTMENT REQUIRED FOR FUTURE REFILLS 6 mL 5 10/05/2023 Evening   lidocaine  (LIDODERM ) 5 % Place 1 patch onto the skin daily as needed (pain). Remove & Discard patch within 12 hours or as directed by MD   10/05/2023   midodrine  (PROAMATINE ) 10 MG tablet Take 1 tablet (10 mg total) by mouth every 8 (eight) hours. (Patient taking differently: Take 10 mg by mouth every 8 (eight) hours. Takes once a day on dialysis days ONLY) 90 tablet 1 10/06/2023 Morning   multivitamin (RENA-VIT) TABS tablet Take 1 tablet by mouth once daily 10 tablet 3 10/06/2023 Morning   Olopatadine  HCl 0.2 % SOLN Place 1 drop into both eyes daily as needed (for allergies).    Past Week   VENTOLIN  HFA 108 (90 Base) MCG/ACT inhaler INHALE 1 TO 2 PUFFS BY MOUTH EVERY 6 HOURS AS NEEDED FOR WHEEZING FOR SHORTNESS OF BREATH 18 g 0 Past Week   Continuous Glucose Receiver (FREESTYLE LIBRE 3 READER) DEVI Use to check blood glucose TID 1 each 0    Continuous Glucose Sensor (FREESTYLE LIBRE 3 PLUS SENSOR) MISC USE AS DIRECTED TO CHECK GLUCOSE DAILY. CHANGE EVERY 15 DAYS 2 each 6    Elastic Bandages & Supports (ABDOMINAL BINDER/ELASTIC MED) MISC 1 kit by Does not apply route daily. 1 each 0    glucose blood test strip Check 1 time daily.  E11.9 One Touch Ultra Blue Test Strips 100 each 3    Insulin  Pen Needle (BD PEN NEEDLE NANO U/F) 32G X 4 MM MISC USE 1 PEN NEEDLE SUBCUTANEOUSLY WITH INSULIN  4 TIMES DAILY 400 each 0    nitroGLYCERIN  (NITROSTAT ) 0.4 MG SL tablet Place 1 tablet (0.4 mg total) under the tongue every 5 (five) minutes as needed for chest pain. 30 tablet 1 Unknown    Family History  Problem Relation Age of Onset   Bladder Cancer Mother    Alcohol  abuse Father    Melanoma Father    Stroke Maternal Grandmother    Heart Problems Maternal Grandmother        unknown   Diabetes Maternal Grandmother  Heart disease Maternal Grandfather    Prostate cancer Maternal Grandfather      Review of Systems:     Cardiac Review of Systems: Y or  [    ]= no  Chest Pain [  yes, right chest and shoulder  ]  Resting SOB [   ] Exertional SOB  [x  ]  Orthopnea [  ]   Pedal Edema [   ]    Palpitations [  ] Syncope  [  ]   Presyncope [   ]  General Review of Systems: [Y] = yes [  ]=no Constitional: recent weight change [ 12lbs since May ]; anorexia [ x ]; fatigue [ x ]; nausea [  ]; night sweats [  ]; fever [ low-grade ]; or chills [  ]                                                                Eye : blurred vision [  ]; diplopia [   ]; vision changes [  ];  Amaurosis fugax[  ]; Resp: cough [  ];  wheezing[  ];  hemoptysis[  ]; shortness of breath[x  ]; paroxysmal nocturnal dyspnea[  ]; dyspnea on exertion[ x ]; or orthopnea[  ];  GI:  gallstones[  ], vomiting[  ];  dysphagia[  ]; melena[  ];  hematochezia [  ]; heartburn[  ];   Hx of  Colonoscopy[ x ]; GU: kidney stones [  ]; hematuria[  ];   dysuria [  ];  nocturia[  ];  history of     obstruction [  ]; urinary frequency [  ]             Skin: rash, swelling[  ];, hair loss[  ];  peripheral edema[  ];  or itching[  ]; Musculosketetal: myalgias[  ];  joint swelling[  ];  joint erythema[  ];  joint pain[  ];  back pain[  ];  Heme/Lymph: bruising[  ];  bleeding[  ];  anemia[   ];  Neuro: TIA[  ];  headaches[  ];  stroke[  ];  vertigo[  ];  seizures[  ];   paresthesias[  ];  difficulty walking[  ];  Psych:depression[  ]; anxiety[  ];  Endocrine: diabetes[ x ];  thyroid  dysfunction[  ];               Physical Exam: BP (!) 101/38   Pulse 73   Temp 99.3 F (37.4 C) (Oral)   Resp 16   Ht 5' 10 (1.778 m)   Wt 84.4 kg   SpO2 93%   BMI 26.70 kg/m    General appearance: alert, cooperative, appears older than stated age, and frail.  There is a tunneled dialysis catheter exiting the right chest Head: Normocephalic, without obvious abnormality, atraumatic Neck: no adenopathy, no carotid bruit, no JVD, and supple, symmetrical, trachea midline Lymph nodes: No palpable cervical, clavicular, or axillary adenopathy.  There is a nodule in the right upper arm that I believe this is a segment of a dialysis fistula that is no longer being used. Resp: Breath sounds diminished on the right, otherwise clear Cardio: Regular rate and rhythm, no obvious murmur GI: Soft, no tenderness Extremities: Both hands  are cool, 1+ radial pulses.  Status post AV fistula in both upper arms neither of which are working currently.  Status post bilateral BKA's. Neurologic: Drowsy but awakens easily and answers questions appropriately.  No focal neurologic deficits noted.  Diagnostic Studies & Laboratory data:     Recent Radiology Findings:   DG CHEST PORT 1 VIEW Result Date: 10/10/2023 CLINICAL DATA:  100030. Leukocytosis and pleural effusion. End-stage renal disease. Hypotension. EXAM: PORTABLE CHEST 1 VIEW COMPARISON:  Chest CT 10/08/2023. FINDINGS: 6:40 a.m. External defibrillator with left chest power source and presternal wire insertion are unchanged as well as a right IJ dialysis catheter with the tip in the right atrium. Moderate-to-large loculated right pleural effusion with overlying lung opacities, appears increased from 2 days ago. Extensive nodular pleural metastatic disease on the  right is better seen with CT, not well demonstrated radiographically. Left lung remains clear. Stable mediastinum. There is aortic atherosclerosis. The cardiac size is normal. No new osseous finding. IMPRESSION: 1. Moderate-to-large loculated right pleural effusion with overlying lung opacities, appears increased from 2 days ago. 2. Extensive nodular pleural metastatic disease on the right is better seen with CT, not well demonstrated radiographically. 3. Aortic atherosclerosis. Electronically Signed   By: Francis Quam M.D.   On: 10/10/2023 06:54   CT CHEST W CONTRAST Result Date: 10/09/2023 CLINICAL DATA:  Possible pulmonary mass on recent chest x-ray EXAM: CT CHEST WITH CONTRAST TECHNIQUE: Multidetector CT imaging of the chest was performed during intravenous contrast administration. RADIATION DOSE REDUCTION: This exam was performed according to the departmental dose-optimization program which includes automated exposure control, adjustment of the mA and/or kV according to patient size and/or use of iterative reconstruction technique. CONTRAST:  75mL OMNIPAQUE  IOHEXOL  350 MG/ML SOLN COMPARISON:  Chest x-ray earlier same day FINDINGS: Cardiovascular: The heart size is normal. No substantial pericardial effusion. Coronary artery calcification is evident. Moderate atherosclerotic calcification is noted in the wall of the thoracic aorta. Right IJ central line tip is positioned at the SVC/RA junction. Extracardiac subcutaneous implantable defibrillator evident. Mediastinum/Nodes: 13 mm short axis paraesophageal lymph node seen anterior to the spine on 68/3. Upper normal right hilar lymph nodes. No left hilar lymphadenopathy. Juxta diaphragmatic ingested cardiac lymphadenopathy is seen in the anterior right chest there is no axillary lymphadenopathy. The esophagus has normal imaging features. Lungs/Pleura: Centrilobular and paraseptal emphysema evident. 17 mm ground-glass opacity identified anterior left upper lobe  on 39/4. 5 mm left lower lobe nodule seen on 119/4. Subsegmental atelectasis noted left base. Volume loss noted right upper and middle lobes with more pronounced collapse/consolidation in the right lower lobe. Areas of loculated pleural fluid are seen in the upper right hemithorax. Extensive pleural nodularity is seen in the right chest including plaque-like nodular pleural thickening inferolaterally on 95/3. Posterior right pleural nodule measures 4.0 x 1.0 cm on 128/3. Confluent soft tissue is seen in the anterior and posterior pleural reflections. The anterolateral pleural disease extends into the right chest wall (see image 101/3) with pathologic fracture of the anterior right fifth rib and destruction of the anterior right sixth and seventh ribs. Upper Abdomen: Visualized portion of the upper abdomen shows no acute findings. Musculoskeletal: See above for bony changes in anterior right fifth through seventh ribs. Otherwise no suspicious lytic or sclerotic osseous abnormality. IMPRESSION: 1. Extensive pleural nodularity in the right chest including plaque-like nodular pleural thickening inferolaterally. The anterolateral pleural disease extends into the right chest wall with pathologic fracture of the anterior right fifth rib and destruction  of the anterior right sixth and seventh ribs. Imaging features are highly suspicious for malignancy. 2. 17 mm ground-glass opacity anterior left upper lobe. This is nonspecific and may be infectious/inflammatory. 3. 5 mm left lower lobe pulmonary nodule. 4. 13 mm short axis paraesophageal lymph node anterior to the spine. Metastatic disease a concern. 5. Aortic Atherosclerosis (ICD10-I70.0) and Emphysema (ICD10-J43.9). Electronically Signed   By: Camellia Candle M.D.   On: 10/09/2023 06:58   US  THORACENTESIS ASP PLEURAL SPACE W/IMG GUIDE Result Date: 10/08/2023 INDICATION: 36304 Hypoxemia 36304 History of ESRD, CHF. Currently in hospital with hypoxemia due to right pleural  effusion. Had thoracentesis 2 days ago with 1.85 L removed. Found to have recurrent increasing right pleural effusion on chest x-ray today. Consulted for repeat right thoracentesis. EXAM: ULTRASOUND GUIDED RIGHT THORACENTESIS MEDICATIONS: 10 mL 1% lidocaine  COMPLICATIONS: None immediate. PROCEDURE: An ultrasound guided thoracentesis was thoroughly discussed with the patient and questions answered. The benefits, risks, alternatives and complications were also discussed. The patient understands and wishes to proceed with the procedure. Written consent was obtained. Ultrasound was performed to localize and mark an adequate pocket of fluid in the RIGHT chest. The area was then prepped and draped in the normal sterile fashion. 1% Lidocaine  was used for local anesthesia. Under ultrasound guidance a 6 Fr Safe-T-Centesis catheter was introduced. Thoracentesis was performed. The catheter was removed and a dressing applied. FINDINGS: A total of approximately 520 mL of amber fluid was removed. Samples were sent to the laboratory as requested by the clinical team. IMPRESSION: Successful ultrasound guided RIGHT thoracentesis yielding 520 mL of pleural fluid. Performed and dictated by Kimble Clas, PA-C under direct supervision of Thom Hall, MD Electronically Signed   By: Thom Hall M.D.   On: 10/08/2023 14:20   DG Chest 1 View Result Date: 10/08/2023 CLINICAL DATA:  [id number] S/P thoracentesis [id number] EXAM: CHEST  1 VIEW COMPARISON:  October 08, 2023 FINDINGS: The cardiomediastinal silhouette is unchanged in contour.RIGHT chest CVC with poor visualization of the tip of the CVC secondary to underpenetration. This favored to project over the RIGHT atrium. LEFT chest AICD. Moderate RIGHT-sided pleural effusion, decreased from prior. No significant pneumothorax. There is a persistent rounded opacification along the RIGHT lateral lung base. IMPRESSION: 1. Moderate RIGHT-sided pleural effusion, decreased from prior. No  significant pneumothorax. 2. Persistent rounded opacification along the RIGHT lateral lung base. This could reflect a loculated component of the pleural effusion versus underlying mass. Recommend correlation with thoracocentesis fluid results with consideration of dedicated CT chest with contrast if clinically indicated. Electronically Signed   By: Corean Salter M.D.   On: 10/08/2023 13:07   ECHOCARDIOGRAM COMPLETE Result Date: 10/08/2023    ECHOCARDIOGRAM REPORT   Patient Name:   Anthony Bullock Date of Exam: 10/08/2023 Medical Rec #:  [id number]     Height:       70.0 in Accession #:    [id number]    Weight:       186.1 lb Date of Birth:  1960/05/21     BSA:          2.024 m Patient Age:    63 years      BP:           101/51 mmHg Patient Gender: M             HR:           73 bpm. Exam Location:  Inpatient Procedure: 2D Echo, Cardiac Doppler and Color Doppler (Both Spectral  and Color            Flow Doppler were utilized during procedure). Indications:    CHF - Acute Diastolic I50.31  History:        Patient has prior history of Echocardiogram examinations, most                 recent 04/11/2023. CHF and Cardiomyopathy, CAD and Previous                 Myocardial Infarction, ESRD, PAD and COPD, Arrythmias:Atrial                 Fibrillation, Signs/Symptoms:Hypotension and Chest Pain; Risk                 Factors:Hypertension, Dyslipidemia and Diabetes.  Sonographer:    Thea Norlander RCS Referring Phys: COSTIN M GHERGHE IMPRESSIONS  1. Left ventricular ejection fraction, by estimation, is 30 to 35%. The left ventricle has moderately decreased function. The left ventricle demonstrates global hypokinesis. Left ventricular diastolic parameters are consistent with Grade I diastolic dysfunction (impaired relaxation).  2. Right ventricular systolic function is normal. The right ventricular size is normal.  3. The mitral valve is normal in structure. No evidence of mitral valve regurgitation. No evidence of mitral  stenosis.  4. The aortic valve is calcified. Aortic valve regurgitation is not visualized. Aortic valve sclerosis is present, with no evidence of aortic valve stenosis.  5. The inferior vena cava is normal in size with greater than 50% respiratory variability, suggesting right atrial pressure of 3 mmHg. FINDINGS  Left Ventricle: Left ventricular ejection fraction, by estimation, is 30 to 35%. The left ventricle has moderately decreased function. The left ventricle demonstrates global hypokinesis. The left ventricular internal cavity size was normal in size. There is no left ventricular hypertrophy. Left ventricular diastolic parameters are consistent with Grade I diastolic dysfunction (impaired relaxation).  LV Wall Scoring: The posterior wall is akinetic. Right Ventricle: The right ventricular size is normal. No increase in right ventricular wall thickness. Right ventricular systolic function is normal. Left Atrium: Left atrial size was normal in size. Right Atrium: Right atrial size was normal in size. Pericardium: There is no evidence of pericardial effusion. Mitral Valve: The mitral valve is normal in structure. No evidence of mitral valve regurgitation. No evidence of mitral valve stenosis. Tricuspid Valve: The tricuspid valve is normal in structure. Tricuspid valve regurgitation is not demonstrated. No evidence of tricuspid stenosis. Aortic Valve: The aortic valve is calcified. Aortic valve regurgitation is not visualized. Aortic valve sclerosis is present, with no evidence of aortic valve stenosis. Aortic valve peak gradient measures 2.7 mmHg. Pulmonic Valve: The pulmonic valve was normal in structure. Pulmonic valve regurgitation is not visualized. No evidence of pulmonic stenosis. Aorta: The aortic root is normal in size and structure. Venous: The inferior vena cava is normal in size with greater than 50% respiratory variability, suggesting right atrial pressure of 3 mmHg. IAS/Shunts: No atrial level shunt  detected by color flow Doppler.  LEFT VENTRICLE PLAX 2D LVIDd:         5.40 cm   Diastology LVIDs:         4.30 cm   LV e' medial:    4.46 cm/s LV PW:         0.90 cm   LV E/e' medial:  8.9 LV IVS:        1.00 cm   LV e' lateral:   5.22 cm/s LVOT diam:  2.20 cm   LV E/e' lateral: 7.6 LV SV:         50 LV SV Index:   25 LVOT Area:     3.80 cm  RIGHT VENTRICLE            IVC RV S prime:     9.57 cm/s  IVC diam: 1.20 cm TAPSE (M-mode): 1.9 cm LEFT ATRIUM           Index        RIGHT ATRIUM          Index LA diam:      3.20 cm 1.58 cm/m   RA Area:     8.68 cm LA Vol (A2C): 26.9 ml 13.29 ml/m  RA Volume:   13.70 ml 6.77 ml/m LA Vol (A4C): 22.1 ml 10.92 ml/m  AORTIC VALVE AV Area (Vmax): 3.13 cm AV Vmax:        82.00 cm/s AV Peak Grad:   2.7 mmHg LVOT Vmax:      67.60 cm/s LVOT Vmean:     46.100 cm/s LVOT VTI:       0.132 m  AORTA Ao Root diam: 3.50 cm Ao Asc diam:  2.90 cm MITRAL VALVE MV Area (PHT): 4.06 cm    SHUNTS MV Decel Time: 187 msec    Systemic VTI:  0.13 m MV E velocity: 39.80 cm/s  Systemic Diam: 2.20 cm MV A velocity: 97.50 cm/s MV E/A ratio:  0.41 Oneil Parchment MD Electronically signed by Oneil Parchment MD Signature Date/Time: 10/08/2023/12:32:34 PM    Final      I have independently reviewed the above radiologic studies and discussed with the patient   Recent Lab Findings: Lab Results  Component Value Date   WBC 19.7 (H) 10/10/2023   HGB 13.1 10/10/2023   HCT 39.9 10/10/2023   PLT 301 10/10/2023   GLUCOSE 172 (H) 10/10/2023   CHOL 78 04/11/2023   TRIG 203 (H) 04/11/2023   HDL 30 (L) 04/11/2023   LDLDIRECT 134.0 04/25/2011   LDLCALC 7 04/11/2023   ALT 17 10/06/2023   AST 24 10/06/2023   NA 134 (L) 10/10/2023   K 3.9 10/10/2023   CL 96 (L) 10/10/2023   CREATININE 12.07 (H) 10/10/2023   BUN 62 (H) 10/10/2023   CO2 21 (L) 10/10/2023   TSH 1.71 06/19/2023   INR 1.1 05/21/2023   HGBA1C 6.7 (A) 09/20/2023      Assessment / Plan:      - Very pleasant 63 year old male with the  above described extensive past medical history including end-stage renal disease, peripheral vascular disease status post bilateral BKA, coronary artery disease status post PTCI earlier this year, C. difficile colitis earlier this year, recent GI bleed related to duodenal ulcer, and heart failure with reduced ejection fraction.  He has had right thoracentesis 2 during the past week after he developed shortness of breath while at dialysis. Cytology on the pleural fluid obtained on 9/28 has not yielded any significant diagnostic information.  CT scan of the chest obtained after the most recent thoracentesis on 9/28 shows:   Extensive pleural nodularity in the right chest including plaque-like nodular pleural thickening inferolaterally. The anterolateral pleural disease extends into the right chest wall with pathologic fracture of the anterior right fifth rib and destruction of the anterior right sixth and seventh ribs. Imaging features are highly suspicious for malignancy.  Case discussed with Dr. Kerrin.  Referral to interventional radiology for percutaneous biopsy is recommended. If IR does  no feel this is feasible, we would need to wait until next week for Plavix  washout before considering thoracoscopy for biopsy.    I  spent 25 minutes counseling the patient face to face.    Alexandrea Westergard G. Lain Tetterton, PA-C   10/10/2023 11:09 AM  See note from 10/1 When I came by to see AnthonyBullock last night he was not in room (?in dialysis) This morning hypotensive requiring pressors and brief PEA arrest.  Currently on multiple pressors and intubated.  Just back from head CT Plan is for IR to attempt needle biopsy of pleural mass tomorrow if stable enough for procedure  Elspeth C. Kerrin, MD [facility] [phone]

## 2023-10-11 ENCOUNTER — Inpatient Hospital Stay (HOSPITAL_COMMUNITY)

## 2023-10-11 DIAGNOSIS — R579 Shock, unspecified: Secondary | ICD-10-CM

## 2023-10-11 DIAGNOSIS — E119 Type 2 diabetes mellitus without complications: Secondary | ICD-10-CM

## 2023-10-11 DIAGNOSIS — I469 Cardiac arrest, cause unspecified: Secondary | ICD-10-CM

## 2023-10-11 DIAGNOSIS — Z9911 Dependence on respirator [ventilator] status: Secondary | ICD-10-CM | POA: Diagnosis not present

## 2023-10-11 DIAGNOSIS — L8990 Pressure ulcer of unspecified site, unspecified stage: Secondary | ICD-10-CM | POA: Insufficient documentation

## 2023-10-11 LAB — COMPREHENSIVE METABOLIC PANEL WITH GFR
ALT: 25 U/L (ref 0–44)
ALT: 52 U/L — ABNORMAL HIGH (ref 0–44)
AST: 35 U/L (ref 15–41)
AST: 87 U/L — ABNORMAL HIGH (ref 15–41)
Albumin: 1.9 g/dL — ABNORMAL LOW (ref 3.5–5.0)
Albumin: 2 g/dL — ABNORMAL LOW (ref 3.5–5.0)
Alkaline Phosphatase: 114 U/L (ref 38–126)
Alkaline Phosphatase: 113 U/L (ref 38–126)
Anion gap: 17 — ABNORMAL HIGH (ref 5–15)
Anion gap: 23 — ABNORMAL HIGH (ref 5–15)
BUN: 53 mg/dL — ABNORMAL HIGH (ref 8–23)
BUN: 58 mg/dL — ABNORMAL HIGH (ref 8–23)
CO2: 19 mmol/L — ABNORMAL LOW (ref 22–32)
CO2: 16 mmol/L — ABNORMAL LOW (ref 22–32)
Calcium: 7.8 mg/dL — ABNORMAL LOW (ref 8.9–10.3)
Calcium: 7.6 mg/dL — ABNORMAL LOW (ref 8.9–10.3)
Chloride: 95 mmol/L — ABNORMAL LOW (ref 98–111)
Chloride: 92 mmol/L — ABNORMAL LOW (ref 98–111)
Creatinine, Ser: 10.18 mg/dL — ABNORMAL HIGH (ref 0.61–1.24)
Creatinine, Ser: 10.48 mg/dL — ABNORMAL HIGH (ref 0.61–1.24)
GFR, Estimated: 5 mL/min — ABNORMAL LOW (ref >60)
GFR, Estimated: 5 mL/min — ABNORMAL LOW (ref >60)
Glucose, Bld: 184 mg/dL — ABNORMAL HIGH (ref 70–99)
Glucose, Bld: 284 mg/dL — ABNORMAL HIGH (ref 70–99)
Potassium: 4.5 mmol/L (ref 3.5–5.1)
Potassium: 4.8 mmol/L (ref 3.5–5.1)
Sodium: 131 mmol/L — ABNORMAL LOW (ref 135–145)
Sodium: 131 mmol/L — ABNORMAL LOW (ref 135–145)
Total Bilirubin: 1 mg/dL (ref 0.0–1.2)
Total Bilirubin: 1.6 mg/dL — ABNORMAL HIGH (ref 0.0–1.2)
Total Protein: 6.5 g/dL (ref 6.5–8.1)
Total Protein: 6.5 g/dL (ref 6.5–8.1)

## 2023-10-11 LAB — POCT I-STAT 7, (LYTES, BLD GAS, ICA,H+H)
Acid-base deficit: 8 mmol/L — ABNORMAL HIGH (ref 0.0–2.0)
Bicarbonate: 17.7 mmol/L — ABNORMAL LOW (ref 20.0–28.0)
Calcium, Ion: 1.01 mmol/L — ABNORMAL LOW (ref 1.15–1.40)
HCT: 38 % — ABNORMAL LOW (ref 39.0–52.0)
Hemoglobin: 12.9 g/dL — ABNORMAL LOW (ref 13.0–17.0)
O2 Saturation: 99 %
Patient temperature: 98.6
Potassium: 4.7 mmol/L (ref 3.5–5.1)
Sodium: 131 mmol/L — ABNORMAL LOW (ref 135–145)
TCO2: 19 mmol/L — ABNORMAL LOW (ref 22–32)
pCO2 arterial: 34.4 mmHg (ref 32–48)
pH, Arterial: 7.319 — ABNORMAL LOW (ref 7.35–7.45)
pO2, Arterial: 123 mmHg — ABNORMAL HIGH (ref 83–108)

## 2023-10-11 LAB — GLUCOSE, CAPILLARY
Glucose-Capillary: 158 mg/dL — ABNORMAL HIGH (ref 70–99)
Glucose-Capillary: 269 mg/dL — ABNORMAL HIGH (ref 70–99)
Glucose-Capillary: 333 mg/dL — ABNORMAL HIGH (ref 70–99)
Glucose-Capillary: 389 mg/dL — ABNORMAL HIGH (ref 70–99)
Glucose-Capillary: 410 mg/dL — ABNORMAL HIGH (ref 70–99)

## 2023-10-11 LAB — CBC
HCT: 38.4 % — ABNORMAL LOW (ref 39.0–52.0)
Hemoglobin: 12.8 g/dL — ABNORMAL LOW (ref 13.0–17.0)
MCH: 29 pg (ref 26.0–34.0)
MCHC: 33.3 g/dL (ref 30.0–36.0)
MCV: 87.1 fL (ref 80.0–100.0)
Platelets: 470 K/uL — ABNORMAL HIGH (ref 150–400)
RBC: 4.41 MIL/uL (ref 4.22–5.81)
RDW: 15.2 % (ref 11.5–15.5)
WBC: 31.8 K/uL — ABNORMAL HIGH (ref 4.0–10.5)
nRBC: 0 % (ref 0.0–0.2)

## 2023-10-11 LAB — RENAL FUNCTION PANEL
Albumin: 2.8 g/dL — ABNORMAL LOW (ref 3.5–5.0)
Anion gap: 26 — ABNORMAL HIGH (ref 5–15)
BUN: 61 mg/dL — ABNORMAL HIGH (ref 8–23)
CO2: 16 mmol/L — ABNORMAL LOW (ref 22–32)
Calcium: 7.4 mg/dL — ABNORMAL LOW (ref 8.9–10.3)
Chloride: 92 mmol/L — ABNORMAL LOW (ref 98–111)
Creatinine, Ser: 10.59 mg/dL — ABNORMAL HIGH (ref 0.61–1.24)
GFR, Estimated: 5 mL/min — ABNORMAL LOW (ref >60)
Glucose, Bld: 379 mg/dL — ABNORMAL HIGH (ref 70–99)
Phosphorus: 6.7 mg/dL — ABNORMAL HIGH (ref 2.5–4.6)
Potassium: 4.5 mmol/L (ref 3.5–5.1)
Sodium: 134 mmol/L — ABNORMAL LOW (ref 135–145)

## 2023-10-11 LAB — APTT
aPTT: 42 s — ABNORMAL HIGH (ref 24–36)
aPTT: >200 s (ref 24–36)

## 2023-10-11 LAB — CBC WITH DIFFERENTIAL/PLATELET
Basophils Absolute: 0 K/uL (ref 0.0–0.1)
Basophils Relative: 0 %
Eosinophils Absolute: 0 K/uL (ref 0.0–0.5)
Eosinophils Relative: 0 %
HCT: 41.9 % (ref 39.0–52.0)
Hemoglobin: 13.7 g/dL (ref 13.0–17.0)
Lymphocytes Relative: 6 %
Lymphs Abs: 1.6 K/uL (ref 0.7–4.0)
MCH: 28.4 pg (ref 26.0–34.0)
MCHC: 32.7 g/dL (ref 30.0–36.0)
MCV: 86.9 fL (ref 80.0–100.0)
Monocytes Absolute: 2.4 K/uL — ABNORMAL HIGH (ref 0.1–1.0)
Monocytes Relative: 9 %
Neutro Abs: 23 K/uL — ABNORMAL HIGH (ref 1.7–7.7)
Neutrophils Relative %: 85 %
Platelets: 474 K/uL — ABNORMAL HIGH (ref 150–400)
RBC: 4.82 MIL/uL (ref 4.22–5.81)
RDW: 15.1 % (ref 11.5–15.5)
WBC: 27.1 K/uL — ABNORMAL HIGH (ref 4.0–10.5)
nRBC: 0 % (ref 0.0–0.2)

## 2023-10-11 LAB — GLUCOSE, PLEURAL OR PERITONEAL FLUID: Glucose, Fluid: 110 mg/dL

## 2023-10-11 LAB — LACTIC ACID, PLASMA
Lactic Acid, Venous: 1.8 mmol/L (ref 0.5–1.9)
Lactic Acid, Venous: 3.1 mmol/L (ref 0.5–1.9)

## 2023-10-11 LAB — PROTEIN, PLEURAL OR PERITONEAL FLUID: Total protein, fluid: 4.1 g/dL

## 2023-10-11 LAB — BODY FLUID CELL COUNT WITH DIFFERENTIAL
Eos, Fluid: 0 %
Lymphs, Fluid: 8 %
Monocyte-Macrophage-Serous Fluid: 1 % — ABNORMAL LOW (ref 50–90)
Neutrophil Count, Fluid: 91 % — ABNORMAL HIGH (ref 0–25)
Total Nucleated Cell Count, Fluid: 240 uL (ref 0–1000)

## 2023-10-11 LAB — ECHOCARDIOGRAM COMPLETE
Area-P 1/2: 4.6 cm2
Height: 70 in
S' Lateral: 3.77 cm
Weight: 2885.38 [oz_av]

## 2023-10-11 LAB — HEPARIN LEVEL (UNFRACTIONATED)
Heparin Unfractionated: >1.1 [IU]/mL — ABNORMAL HIGH (ref 0.30–0.70)
Heparin Unfractionated: >1.1 [IU]/mL — ABNORMAL HIGH (ref 0.30–0.70)

## 2023-10-11 LAB — LACTATE DEHYDROGENASE, PLEURAL OR PERITONEAL FLUID: LD, Fluid: 947 U/L — ABNORMAL HIGH (ref 3–23)

## 2023-10-11 LAB — HEMOGLOBIN AND HEMATOCRIT, BLOOD
HCT: 36.6 % — ABNORMAL LOW (ref 39.0–52.0)
HCT: 34.4 % — ABNORMAL LOW (ref 39.0–52.0)
Hemoglobin: 12.1 g/dL — ABNORMAL LOW (ref 13.0–17.0)
Hemoglobin: 11.7 g/dL — ABNORMAL LOW (ref 13.0–17.0)

## 2023-10-11 LAB — TROPONIN I (HIGH SENSITIVITY)
Troponin I (High Sensitivity): 897 ng/L (ref <18)
Troponin I (High Sensitivity): 1127 ng/L (ref <18)

## 2023-10-11 LAB — MAGNESIUM
Magnesium: 1.9 mg/dL (ref 1.7–2.4)
Magnesium: 3.5 mg/dL — ABNORMAL HIGH (ref 1.7–2.4)

## 2023-10-11 LAB — ALBUMIN, PLEURAL OR PERITONEAL FLUID: Albumin, Fluid: 1.6 g/dL

## 2023-10-11 LAB — MRSA NEXT GEN BY PCR, NASAL: MRSA by PCR Next Gen: NOT DETECTED

## 2023-10-11 LAB — LACTATE DEHYDROGENASE: LDH: 275 U/L — ABNORMAL HIGH (ref 98–192)

## 2023-10-11 MED ORDER — VANCOMYCIN HCL IN DEXTROSE 1-5 GM/200ML-% IV SOLN: 1000.0000 mg | INTRAVENOUS | Status: DC

## 2023-10-11 MED ORDER — LIDOCAINE HCL (PF) 1 % IJ SOLN
INTRAMUSCULAR | Status: AC
  Filled 2023-10-11: qty 5

## 2023-10-11 MED ORDER — SUCCINYLCHOLINE CHLORIDE 200 MG/10ML IV SOSY
PREFILLED_SYRINGE | INTRAVENOUS | Status: AC
  Filled 2023-10-11: qty 10

## 2023-10-11 MED ORDER — RENA-VITE PO TABS
1.0000 | ORAL_TABLET | Freq: Every day | ORAL | Status: DC
  Administered 2023-10-12 – 2023-10-19 (×8): 1
  Filled 2023-10-11 (×8): qty 1

## 2023-10-11 MED ORDER — DOCUSATE SODIUM 50 MG/5ML PO LIQD
100.0000 mg | Freq: Two times a day (BID) | ORAL | Status: DC
  Administered 2023-10-11 – 2023-10-19 (×17): 100 mg
  Filled 2023-10-11 (×17): qty 10

## 2023-10-11 MED ORDER — SODIUM CHLORIDE 0.9 % IV SOLN
100.0000 mg | Freq: Two times a day (BID) | INTRAVENOUS | Status: DC
  Administered 2023-10-11 – 2023-10-14 (×7): 100 mg via INTRAVENOUS
  Filled 2023-10-11 (×8): qty 100

## 2023-10-11 MED ORDER — FAMOTIDINE 20 MG PO TABS
20.0000 mg | ORAL_TABLET | Freq: Every day | ORAL | Status: DC
  Administered 2023-10-11 – 2023-10-19 (×9): 20 mg
  Filled 2023-10-11 (×9): qty 1

## 2023-10-11 MED ORDER — HEPARIN (PORCINE) 25000 UT/250ML-% IV SOLN
1250.0000 [IU]/h | INTRAVENOUS | Status: DC
  Administered 2023-10-11: 1250 [IU]/h via INTRAVENOUS
  Filled 2023-10-11: qty 250

## 2023-10-11 MED ORDER — MAGNESIUM SULFATE 2 GM/50ML IV SOLN
2.0000 g | Freq: Once | INTRAVENOUS | Status: AC
  Administered 2023-10-11: 2 g via INTRAVENOUS
  Filled 2023-10-11: qty 50

## 2023-10-11 MED ORDER — MIDAZOLAM HCL 2 MG/2ML IJ SOLN
INTRAMUSCULAR | Status: AC
  Filled 2023-10-11: qty 2

## 2023-10-11 MED ORDER — PHENYLEPHRINE 80 MCG/ML (10ML) SYRINGE FOR IV PUSH (FOR BLOOD PRESSURE SUPPORT)
PREFILLED_SYRINGE | INTRAVENOUS | Status: AC
  Filled 2023-10-11: qty 20

## 2023-10-11 MED ORDER — FAMOTIDINE 20 MG PO TABS
20.0000 mg | ORAL_TABLET | Freq: Two times a day (BID) | ORAL | Status: DC
Start: 2023-10-11 — End: 2023-10-11

## 2023-10-11 MED ORDER — SODIUM CHLORIDE 0.9 % IV SOLN
1.0000 g | INTRAVENOUS | Status: DC
  Administered 2023-10-11: 1 g via INTRAVENOUS
  Filled 2023-10-11: qty 10

## 2023-10-11 MED ORDER — FENTANYL CITRATE PF 50 MCG/ML IJ SOSY: 25.0000 ug | PREFILLED_SYRINGE | Freq: Once | INTRAMUSCULAR | Status: DC

## 2023-10-11 MED ORDER — OXYCODONE HCL 5 MG PO TABS
5.0000 mg | ORAL_TABLET | ORAL | Status: DC | PRN
  Administered 2023-10-11: 10 mg
  Filled 2023-10-11: qty 2

## 2023-10-11 MED ORDER — FENTANYL 2500MCG IN NS 250ML (10MCG/ML) PREMIX INFUSION
0.0000 ug/h | INTRAVENOUS | Status: DC
  Administered 2023-10-11: 225 ug/h via INTRAVENOUS
  Administered 2023-10-11: 50 ug/h via INTRAVENOUS
  Administered 2023-10-12: 325 ug/h via INTRAVENOUS
  Administered 2023-10-12 – 2023-10-14 (×6): 300 ug/h via INTRAVENOUS
  Administered 2023-10-14: 100 ug/h via INTRAVENOUS
  Administered 2023-10-16: 75 ug/h via INTRAVENOUS
  Filled 2023-10-11 (×11): qty 250

## 2023-10-11 MED ORDER — HEPARIN BOLUS VIA INFUSION
4000.0000 [IU] | Freq: Once | INTRAVENOUS | Status: AC
Start: 2023-10-11 — End: 2023-10-11
  Administered 2023-10-11: 4000 [IU] via INTRAVENOUS
  Filled 2023-10-11: qty 4000

## 2023-10-11 MED ORDER — ETOMIDATE 2 MG/ML IV SOLN
INTRAVENOUS | Status: AC
  Filled 2023-10-11: qty 20

## 2023-10-11 MED ORDER — IOHEXOL 350 MG/ML SOLN
75.0000 mL | Freq: Once | INTRAVENOUS | Status: AC | PRN
  Administered 2023-10-11: 75 mL via INTRAVENOUS

## 2023-10-11 MED ORDER — ARFORMOTEROL TARTRATE 15 MCG/2ML IN NEBU
15.0000 ug | INHALATION_SOLUTION | Freq: Two times a day (BID) | RESPIRATORY_TRACT | Status: DC
  Administered 2023-10-11 – 2023-10-19 (×18): 15 ug via RESPIRATORY_TRACT
  Filled 2023-10-11 (×15): qty 2

## 2023-10-11 MED ORDER — NOREPINEPHRINE 16 MG/250ML-% IV SOLN
0.0000 ug/min | INTRAVENOUS | Status: DC
  Administered 2023-10-11: 22 ug/min via INTRAVENOUS
  Administered 2023-10-11: 40 ug/min via INTRAVENOUS
  Administered 2023-10-11: 37 ug/min via INTRAVENOUS
  Administered 2023-10-12: 14 ug/min via INTRAVENOUS
  Administered 2023-10-12: 28 ug/min via INTRAVENOUS
  Administered 2023-10-14: 6 ug/min via INTRAVENOUS
  Administered 2023-10-15: 13.1 ug/min via INTRAVENOUS
  Administered 2023-10-16: 12 ug/min via INTRAVENOUS
  Administered 2023-10-17: 6 ug/min via INTRAVENOUS
  Administered 2023-10-19: 10 ug/min via INTRAVENOUS
  Filled 2023-10-11 (×10): qty 250

## 2023-10-11 MED ORDER — MIDAZOLAM HCL 2 MG/2ML IJ SOLN
2.0000 mg | Freq: Once | INTRAMUSCULAR | Status: AC
  Administered 2023-10-11: 2 mg via INTRAVENOUS

## 2023-10-11 MED ORDER — MIDAZOLAM HCL 2 MG/2ML IJ SOLN
INTRAMUSCULAR | Status: AC
  Administered 2023-10-11: 4 mg
  Filled 2023-10-11: qty 2

## 2023-10-11 MED ORDER — ATORVASTATIN CALCIUM 80 MG PO TABS
80.0000 mg | ORAL_TABLET | Freq: Every day | ORAL | Status: DC
  Administered 2023-10-11 – 2023-10-18 (×8): 80 mg
  Filled 2023-10-11 (×8): qty 1

## 2023-10-11 MED ORDER — CLOPIDOGREL BISULFATE 75 MG PO TABS
75.0000 mg | ORAL_TABLET | Freq: Every day | ORAL | Status: DC
  Administered 2023-10-12 – 2023-10-19 (×8): 75 mg
  Filled 2023-10-11 (×8): qty 1

## 2023-10-11 MED ORDER — PIPERACILLIN-TAZOBACTAM IN DEX 2-0.25 GM/50ML IV SOLN
2.2500 g | Freq: Three times a day (TID) | INTRAVENOUS | Status: DC
  Filled 2023-10-11 (×2): qty 50

## 2023-10-11 MED ORDER — MIDODRINE HCL 5 MG PO TABS
10.0000 mg | ORAL_TABLET | Freq: Three times a day (TID) | ORAL | Status: DC
Start: 2023-10-11 — End: 2023-10-13
  Administered 2023-10-11 – 2023-10-13 (×6): 10 mg
  Filled 2023-10-11 (×6): qty 2

## 2023-10-11 MED ORDER — ORAL CARE MOUTH RINSE
15.0000 mL | OROMUCOSAL | Status: DC
  Administered 2023-10-11 – 2023-10-19 (×95): 15 mL via OROMUCOSAL

## 2023-10-11 MED ORDER — IPRATROPIUM-ALBUTEROL 0.5-2.5 (3) MG/3ML IN SOLN
3.0000 mL | RESPIRATORY_TRACT | Status: DC | PRN
  Administered 2023-10-14: 3 mL via RESPIRATORY_TRACT
  Filled 2023-10-11: qty 3

## 2023-10-11 MED ORDER — ORAL CARE MOUTH RINSE: 15.0000 mL | OROMUCOSAL | Status: DC | PRN

## 2023-10-11 MED ORDER — SODIUM CHLORIDE 0.9 % IV BOLUS
250.0000 mL | INTRAVENOUS | Status: DC
Start: 2023-10-11 — End: 2023-10-11

## 2023-10-11 MED ORDER — NOREPINEPHRINE 4 MG/250ML-% IV SOLN
0.0000 ug/min | INTRAVENOUS | Status: DC
  Administered 2023-10-11: 17 ug/min via INTRAVENOUS
  Administered 2023-10-11: 2 ug/min via INTRAVENOUS
  Filled 2023-10-11 (×2): qty 250

## 2023-10-11 MED ORDER — FENTANYL CITRATE PF 50 MCG/ML IJ SOSY: 50.0000 ug | PREFILLED_SYRINGE | INTRAMUSCULAR | Status: DC | PRN

## 2023-10-11 MED ORDER — FENTANYL CITRATE PF 50 MCG/ML IJ SOSY
PREFILLED_SYRINGE | INTRAMUSCULAR | Status: AC
  Filled 2023-10-11: qty 2

## 2023-10-11 MED ORDER — ROCURONIUM BROMIDE 10 MG/ML (PF) SYRINGE
PREFILLED_SYRINGE | INTRAVENOUS | Status: AC
  Filled 2023-10-11: qty 10

## 2023-10-11 MED ORDER — SODIUM BICARBONATE 8.4 % IV SOLN
100.0000 meq | Freq: Once | INTRAVENOUS | Status: AC
  Administered 2023-10-11: 100 meq via INTRAVENOUS
  Filled 2023-10-11: qty 50

## 2023-10-11 MED ORDER — VANCOMYCIN HCL 2000 MG/400ML IV SOLN
2000.0000 mg | Freq: Once | INTRAVENOUS | Status: AC
  Administered 2023-10-11: 2000 mg via INTRAVENOUS
  Filled 2023-10-11: qty 400

## 2023-10-11 MED ORDER — VASOPRESSIN 20 UNITS/100 ML INFUSION FOR SHOCK
0.0400 [IU]/min | INTRAVENOUS | Status: DC
  Administered 2023-10-11 – 2023-10-14 (×10): 0.04 [IU]/min via INTRAVENOUS
  Filled 2023-10-11 (×10): qty 100

## 2023-10-11 MED ORDER — HYDROCORTISONE SOD SUC (PF) 100 MG IJ SOLR
100.0000 mg | Freq: Two times a day (BID) | INTRAMUSCULAR | Status: DC
Start: 2023-10-11 — End: 2023-10-17
  Administered 2023-10-11 – 2023-10-16 (×12): 100 mg via INTRAVENOUS
  Filled 2023-10-11 (×12): qty 2

## 2023-10-11 MED ORDER — ALBUMIN HUMAN 5 % IV SOLN
INTRAVENOUS | Status: AC
  Administered 2023-10-11: 12.5 g
  Filled 2023-10-11: qty 250

## 2023-10-11 MED ORDER — PIPERACILLIN-TAZOBACTAM IN DEX 2-0.25 GM/50ML IV SOLN
2.2500 g | Freq: Three times a day (TID) | INTRAVENOUS | Status: DC
  Administered 2023-10-12: 2.25 g via INTRAVENOUS
  Filled 2023-10-11 (×3): qty 50

## 2023-10-11 MED ORDER — NEPRO/CARBSTEADY PO LIQD: 237.0000 mL | Freq: Two times a day (BID) | ORAL | Status: DC

## 2023-10-11 MED ORDER — FENTANYL BOLUS VIA INFUSION
25.0000 ug | INTRAVENOUS | Status: DC | PRN
  Administered 2023-10-13: 100 ug via INTRAVENOUS
  Administered 2023-10-13: 25 ug via INTRAVENOUS
  Administered 2023-10-13: 100 ug via INTRAVENOUS
  Administered 2023-10-14: 50 ug via INTRAVENOUS
  Administered 2023-10-14: 25 ug via INTRAVENOUS
  Administered 2023-10-14 – 2023-10-15 (×2): 50 ug via INTRAVENOUS

## 2023-10-11 MED ORDER — PERFLUTREN LIPID MICROSPHERE
1.0000 mL | INTRAVENOUS | Status: AC | PRN
  Administered 2023-10-11: 2 mL via INTRAVENOUS

## 2023-10-11 MED ORDER — REVEFENACIN 175 MCG/3ML IN SOLN
175.0000 ug | Freq: Every day | RESPIRATORY_TRACT | Status: DC
  Administered 2023-10-11 – 2023-10-19 (×9): 175 ug via RESPIRATORY_TRACT
  Filled 2023-10-11 (×9): qty 3

## 2023-10-11 MED ORDER — ALBUMIN HUMAN 25 % IV SOLN
50.0000 g | Freq: Once | INTRAVENOUS | Status: AC
  Administered 2023-10-11: 50 g via INTRAVENOUS
  Filled 2023-10-11: qty 200

## 2023-10-11 MED ORDER — HEPARIN (PORCINE) 25000 UT/250ML-% IV SOLN
950.0000 [IU]/h | INTRAVENOUS | Status: DC
  Administered 2023-10-12: 950 [IU]/h via INTRAVENOUS
  Filled 2023-10-11: qty 250

## 2023-10-11 MED ORDER — INSULIN ASPART 100 UNIT/ML IJ SOLN
0.0000 [IU] | INTRAMUSCULAR | Status: DC
  Administered 2023-10-11: 9 [IU] via SUBCUTANEOUS
  Administered 2023-10-11: 5 [IU] via SUBCUTANEOUS
  Administered 2023-10-11: 7 [IU] via SUBCUTANEOUS

## 2023-10-11 MED ORDER — EPINEPHRINE HCL 5 MG/250ML IV SOLN IN NS
0.5000 ug/min | INTRAVENOUS | Status: DC
  Administered 2023-10-11: 12 ug/min via INTRAVENOUS
  Administered 2023-10-11: 5 ug/min via INTRAVENOUS
  Administered 2023-10-11: 19 ug/min via INTRAVENOUS
  Administered 2023-10-12: 11 ug/min via INTRAVENOUS
  Filled 2023-10-11 (×4): qty 250

## 2023-10-11 MED ORDER — MIDODRINE HCL 5 MG PO TABS
20.0000 mg | ORAL_TABLET | ORAL | Status: AC
Start: 2023-10-11 — End: 2023-10-11
  Administered 2023-10-11: 20 mg via ORAL
  Filled 2023-10-11: qty 4

## 2023-10-11 MED ORDER — BUDESONIDE 0.5 MG/2ML IN SUSP
0.5000 mg | Freq: Two times a day (BID) | RESPIRATORY_TRACT | Status: DC
Start: 2023-10-11 — End: 2023-10-20
  Administered 2023-10-11 – 2023-10-19 (×18): 0.5 mg via RESPIRATORY_TRACT
  Filled 2023-10-11 (×17): qty 2

## 2023-10-11 MED ORDER — FENTANYL CITRATE PF 50 MCG/ML IJ SOSY
50.0000 ug | PREFILLED_SYRINGE | INTRAMUSCULAR | Status: DC | PRN
  Administered 2023-10-11: 100 ug via INTRAVENOUS
  Filled 2023-10-11: qty 2

## 2023-10-11 MED ORDER — POLYETHYLENE GLYCOL 3350 17 G PO PACK
17.0000 g | PACK | Freq: Every day | ORAL | Status: DC
  Administered 2023-10-11 – 2023-10-16 (×6): 17 g
  Filled 2023-10-11 (×6): qty 1

## 2023-10-11 NOTE — Progress Notes (Signed)
 Patient transported to CT via the ventilator @1427  and transported to 4N31 from CT @ 1447 via the ventilator with no complications.

## 2023-10-11 NOTE — Progress Notes (Signed)
 Date and time results received: 10/11/23 2218  Test: APTT Critical Value: >200  Name of Provider Notified: Latanya Hint, [facility]; Rosina RN (E-link)  Heparin  gtt to be held for 1.5 hours and restarted at 950 units/hr at 0000, check 8 hr APTT

## 2023-10-11 NOTE — Procedures (Signed)
 Responded to code blue in ICU. Upon my arrival, CPR ongoing. Inquired about access and was told HD cath present, but in need of additional central access. IO catheter placed by me. CCM at bedside and I discussed with him whether he felt additional CVC access warranted and he declined. Further care per primary team.  Dreama GEANNIE Hanger, MD General and Trauma Surgery [facility]        Operative Note   Date: 10/11/2023  Procedure: intraosseus catheter placement  Pre-op  diagnosis: cardiac arrest Post-op diagnosis: cardiac arrest  Indication and clinical history: The patient is a 63 y.o. year old male with cardiac arrest.     Surgeon: Dreama GEANNIE Hanger, MD  Description of procedure: The patient was positioned supine. This procedure was performed emergently and therefore informed consent was not obtained and it was not performed under sterile conditions. IO drill used to access the tibia. After removal of the needle, tubing attached. Catheter aspirated and flushed well. Dressing applied.    Dreama GEANNIE Hanger, MD General and Trauma Surgery [facility] Surgery

## 2023-10-11 NOTE — Significant Event (Signed)
 Patient was noticed to be hypotensive by patient's nurse with systolic in the 70s.  Patient also mildly lethargic and oxygen  requirement went up to 5 L.  On exam at bedside not in distress still hypotensive.  Denies any chest pain or shortness of breath.  I reviewed patient's labs notes and medications.  Patient admitted for shortness of breath with pleural effusion has underwent thoracentesis awaiting further workup also  on antibiotics with leukocytosis.  Patient already has received 20 mg of midodrine  despite which patient is still hypotensive.  Blood pressure is 70 systolic heart rate 70/min temperature 98.3 respiration 18/min HEENT anicteric no pallor. Chest bilateral air entry present. Heart S1-S2 heard. Abdomen soft nontender bowel sound present. Neuro mild lethargic but oriented to time place and person moving all extremities.  Assessment and plan  Persistent hypotension concerning for developing sepsis patient already on antibiotics.  Started on Levophed .  Will check labs including lactic acid discussed with Dr. Maree pulmonary critical care.  Redia Cleaver

## 2023-10-11 NOTE — Progress Notes (Signed)
 PCCM called to evaluate for anisocoria. Right pupil 6mm and left pupil 2mm; sluggish reactive b/l. Patient following commands and symmetrical strength b/l. Will check ct head.  JD Emilio RIGGERS [facility] 10/11/2023, 2:17 PM  Please see [email or web address] for pager details.  From 7A-7P if no response, please call [phone]. After hours, please call ELink [phone].

## 2023-10-11 NOTE — Significant Event (Addendum)
 Rapid Response Event Note   Reason for Call :  Hypotension, hypoxia, lethargy  Per RN, pt woke up c/o SOB, SpO2-80s on 2L [place], SBP-70s.  Pt FiO2 increased to 5L [place] with SpO2 increasing to 97% 10mg  midodrine  given at 1802 and 2334. Initial Focused Assessment:  Pt lying in bed with eyes closed. He awakes to voice and is able to answer questions and follow commands. He denies CP/SOB. He does c/o some dizziness. He falls asleep while talking to you. Lungs clear on L side, diminished/crackly on R side. Skin warm/dry.   HR-72, BP-56/34, RR-18, SpO2-99% on 5L [place]  Pt had HD yesterday and was given 100cc during tx.   Levo gtt started and titrated up to 14mcg through pt's R PIV. Due to increased rate of levophed , gtt was moved to the red port of his R HDC after removing heparin  from red port using aseptic technique. Heparin  remains in blue port of HDC at this time.    Interventions:  Increased FiO2 Levo gtt PCCM consulted: Tx to ICU Plan of Care:  Tx to 4N31.  Event Summary:   MD Notified: Dr. Shona notified. Dr. Franky to bedside to assess. PCCM consulted: Dr. Maree to bedside.  Call Upfz:[phone] Arrival Time:0405 End Upfz:9491  Tish Graeme Piety, RN

## 2023-10-11 NOTE — Progress Notes (Addendum)
 PHARMACY - ANTICOAGULATION CONSULT NOTE  Pharmacy Consult for heparin  Indication: atrial fibrillation  Allergies  Allergen Reactions   Epoetin  Alfa Other (See Comments)    Unknown    Ferumoxytol  Other (See Comments)    Unknown    Morphine Sulfate Rash and Other (See Comments)    Itches all over, red spots    Patient Measurements: Height: 5' 10 (177.8 cm) Weight: 81.8 kg (180 lb 5.4 oz) IBW/kg (Calculated) : 73 HEPARIN  DW (KG): 82.1  Vital Signs: Temp: 98.1 F (36.7 C) (10/01 0512) Temp Source: Oral (10/01 0512) BP: 97/42 (10/01 1105) Pulse Rate: 95 (10/01 1105)  Labs: Recent Labs    10/10/23 0206 10/10/23 1626 10/11/23 0620 10/11/23 0931 10/11/23 0949  HGB 13.1 12.2* 13.7 12.9* 12.8*  HCT 39.9 36.6* 41.9 38.0* 38.4*  PLT 301 304 474*  --  470*  CREATININE 12.07*  --  10.18*  --  10.48*    Estimated Creatinine Clearance: 7.5 mL/min (A) (by C-G formula based on SCr of 10.48 mg/dL (H)).   Medical History: Past Medical History:  Diagnosis Date   AICD (automatic cardioverter/defibrillator) present    [employer]   Allergic rhinitis    Anemia    Arthritis    Chronic systolic heart failure (HCC)    a. ECHO (12/2012) EF 25-30%, HK entireanteroseptal myocardium //  b.  EF 25%, diffuse HK, grade 1 diastolic dysfunction, MAC, mild LAE, normal RVSF, trivial pericardial effusion   COPD (chronic obstructive pulmonary disease) (HCC)    Diabetes mellitus type II    Diabetic nephropathy (HCC)    Diabetic neuropathy (HCC)    ESRD on hemodialysis (HCC)    started HD June 2017, goes to [facility] HD unit, Dr Edwardo   History of cardiac catheterization    a.Myoview  1/15:  There is significant left ventricular dysfunction. There may be slight scar at the apex. There is no significant ischemia. LV Ejection Fraction: 27%  //  b. RHC/LHC (1/15) with mean RA 6, PA 47/22 mean 33, mean PCWP 20, PVR 2.5 WU, CI 2.5; 80% dLAD stenosis, 70% diffuse large D.     History of  kidney stones    Hyperlipidemia    Hypertension    Kidney stones    NICM (nonischemic cardiomyopathy) (HCC)    Primarily nonischemic.  Echo (12/14) with EF 25-30%.  Echo (3/15) with EF 25%, mild to moderately dilated LV, normal RV size and systolic function.     Osteomyelitis (HCC)    left fifth ray   Pneumonia    Urethral stricture    Wears glasses     Medications:  Medications Prior to Admission  Medication Sig Dispense Refill Last Dose/Taking   acetaminophen  (TYLENOL ) 325 MG tablet Take 1-2 tablets (325-650 mg total) by mouth every 4 (four) hours as needed for mild pain.   10/05/2023 Evening   amiodarone  (PACERONE ) 200 MG tablet TAKE 1 TABLET ( 200 MG ) BY MOUTH TWICE DAILY FOR 20 DAYS, THEN 1 TABLET ( 200 MG TOTAL ) ONCE DAILY (Patient taking differently: Take 200 mg by mouth daily.) 90 tablet 0 10/06/2023 Morning   apixaban  (ELIQUIS ) 5 MG TABS tablet Take 1 tablet (5 mg total) by mouth 2 (two) times daily. 60 tablet 1 10/06/2023 at  9:00 AM   atorvastatin  (LIPITOR ) 80 MG tablet Take 1 tablet (80 mg total) by mouth at bedtime. 90 tablet 3 10/05/2023 Bedtime   carvedilol  (COREG ) 6.25 MG tablet Take 1 tablet (6.25 mg total) by mouth 2 (  two) times daily. 180 tablet 3 10/06/2023 Morning   cinacalcet  (SENSIPAR ) 30 MG tablet Take 1 tablet (30 mg total) by mouth Every Tuesday,Thursday,and Saturday with dialysis. 60 tablet  10/05/2023 Morning   clopidogrel  (PLAVIX ) 75 MG tablet Take 1 tablet (75 mg total) by mouth daily with breakfast. 30 tablet 2 10/06/2023 Morning   gabapentin  (NEURONTIN ) 300 MG capsule Take 1 capsule (300 mg total) by mouth at bedtime. 30 capsule 2 10/05/2023 Bedtime   HUMALOG  KWIKPEN 200 UNIT/ML KwikPen INJECT A MAXIMUM OF 28 UNITS SUBCUTANEOUSLY TWICE DAILY WITH LUNCH AND SUPPER PER SLIDING SCALE. APPOINTMENT REQUIRED FOR FUTURE REFILLS 6 mL 5 10/05/2023 Evening   lidocaine  (LIDODERM ) 5 % Place 1 patch onto the skin daily as needed (pain). Remove & Discard patch within 12 hours or  as directed by MD   10/05/2023   midodrine  (PROAMATINE ) 10 MG tablet Take 1 tablet (10 mg total) by mouth every 8 (eight) hours. (Patient taking differently: Take 10 mg by mouth every 8 (eight) hours. Takes once a day on dialysis days ONLY) 90 tablet 1 10/06/2023 Morning   multivitamin (RENA-VIT) TABS tablet Take 1 tablet by mouth once daily 10 tablet 3 10/06/2023 Morning   Olopatadine  HCl 0.2 % SOLN Place 1 drop into both eyes daily as needed (for allergies).    Past Week   VENTOLIN  HFA 108 (90 Base) MCG/ACT inhaler INHALE 1 TO 2 PUFFS BY MOUTH EVERY 6 HOURS AS NEEDED FOR WHEEZING FOR SHORTNESS OF BREATH 18 g 0 Past Week   Continuous Glucose Receiver (FREESTYLE LIBRE 3 READER) DEVI Use to check blood glucose TID 1 each 0    Continuous Glucose Sensor (FREESTYLE LIBRE 3 PLUS SENSOR) MISC USE AS DIRECTED TO CHECK GLUCOSE DAILY. CHANGE EVERY 15 DAYS 2 each 6    Elastic Bandages & Supports (ABDOMINAL BINDER/ELASTIC MED) MISC 1 kit by Does not apply route daily. 1 each 0    glucose blood test strip Check 1 time daily. E11.9 One Touch Ultra Blue Test Strips 100 each 3    Insulin  Pen Needle (BD PEN NEEDLE NANO U/F) 32G X 4 MM MISC USE 1 PEN NEEDLE SUBCUTANEOUSLY WITH INSULIN  4 TIMES DAILY 400 each 0    nitroGLYCERIN  (NITROSTAT ) 0.4 MG SL tablet Place 1 tablet (0.4 mg total) under the tongue every 5 (five) minutes as needed for chest pain. 30 tablet 1 Unknown    Assessment: Patient is a 63 year old admitted for septic shock requiring multiple pressors and pleural effusion, complicated by respiratory arrest. He has a history of atrial fibrillation on Eliquis  PTA. Last dose Eliquis  10/09/23 AM.   Hgb 12.8, PLT 480. No bleeding concerns.   Goal of Therapy:  Heparin  level 0.3-0.7 units/ml aPTT 66-102 seconds Monitor platelets by anticoagulation protocol: Yes   Plan:  Give 4000 units bolus x 1 Start 1250 units/hr  aPTT and Heparin  level in 8 hours and daily until levels correlate   Ramelo Oetken M  Meiling Hendriks 10/11/2023,11:54 AM

## 2023-10-11 NOTE — Progress Notes (Addendum)
 Pharmacy Antibiotic Note  AUTHOR HATLESTAD is a 63 y.o. male admitted on 10/06/2023 with shortness of breath and chest pain now s/p thoracentesis. Pharmacy has been consulted for zosyn /vancomycin  dosing give hypotensive requiring vasopressors and txr to ICU.  -CT: RUL pulmonary mass with right chest wall infiltration c/w malignancy to have biopsy on 10/2 -WBC 10 > 21, ESRD (HD TTS), afebrile  -Ceftriaxone /azithro x1 -COVID/flu neg, Pleural fluid Cx NG <24 hours, Blood Cx and MRSA PCR collected  Plan: -Cefepime  1g IV every 24 hours -Vancomycin  2g IV x1 -Vancomycin  1g IV after every HD session -Azithromycin  500mg  IV every 24 hours -Monitor renal function -Follow up signs of clinical improvement, LOT, de-escalation of antibiotics  -F/u CTA and plans for lung biopsy   Height: 5' 10 (177.8 cm) Weight: 81.9 kg (180 lb 8.9 oz) IBW/kg (Calculated) : 73  Temp (24hrs), Avg:98.6 F (37 C), Min:98.1 F (36.7 C), Max:99.3 F (37.4 C)  Recent Labs  Lab 10/06/23 1359 10/06/23 1400 10/07/23 0523 10/08/23 0244 10/09/23 0258 10/10/23 0206 10/10/23 1626  WBC 9.9  --  10.1  --   --  19.7* 21.4*  CREATININE  --  8.08* 9.42* 7.66* 10.09* 12.07*  --     Estimated Creatinine Clearance: 6.6 mL/min (A) (by C-G formula based on SCr of 12.07 mg/dL (H)).    Allergies  Allergen Reactions   Epoetin  Alfa Other (See Comments)    Unknown    Ferumoxytol  Other (See Comments)    Unknown    Morphine Sulfate Rash and Other (See Comments)    Itches all over, red spots    Antimicrobials this admission: Ceftriaxone zenovia x1 Cefepime  10/1 >>  Vancomycin  10/1 >>   Microbiology results: 10/1 BCx:  9/29 Pleural Cx:   10/1 MRSA PCR:   Thank you for allowing pharmacy to be a part of this patient's care.  Lynwood Poplar, PharmD, BCPS Clinical Pharmacist 10/11/2023 5:31 AM

## 2023-10-11 NOTE — Procedures (Signed)
 Arterial Catheter Insertion Procedure Note  ROBET CRUTCHFIELD  [id number]  [birth year]  Date:10/11/23  Time:9:25 AM    Provider Performing: Norleen JONETTA Cedar    Procedure: Insertion of Arterial Line (63379) with US  guidance (23062)   Indication(s) Blood pressure monitoring and/or need for frequent ABGs  Consent Unable to obtain consent due to emergent nature of procedure.  Anesthesia None   Time Out Verified patient identification, verified procedure, site/side was marked, verified correct patient position, special equipment/implants available, medications/allergies/relevant history reviewed, required imaging and test results available.   Sterile Technique Maximal sterile technique including full sterile barrier drape, hand hygiene, sterile gown, sterile gloves, mask, hair covering, sterile ultrasound probe cover (if used).   Procedure Description Area of catheter insertion was cleaned with chlorhexidine  and draped in sterile fashion. With real-time ultrasound guidance an arterial catheter was placed into the right femoral artery.  Appropriate arterial tracings confirmed on monitor.     Complications/Tolerance None; patient tolerated the procedure well.   EBL Minimal   Specimen(s) None  JD Cedar RIGGERS [facility] 10/11/2023, 9:25 AM  Please see [email or web address] for pager details.  From 7A-7P if no response, please call [phone]. After hours, please call ELink [phone].

## 2023-10-11 NOTE — Progress Notes (Signed)
 Addendum for 10/10/2023 Received report from Day shift Dialysis Nurse  Alert and oriented.  Informed consent signed and in chart.   TX duration: 1.47 hours Patient began to express immediately that he generally was not feeling well and wanted to terminate his treatment. Discussed the importance of patient remaining on treatment full time. Patient adamantly refused. AMA for Hemodialysis treatment signed, provider notified. Treatment terminated  Patient tolerated well.  Transported back to the room  Alert, without acute distress.  Hand-off given to patient's nurse.   Access used: Dialysis catheter Access issues: None  Total UF removed: 100 Medication(s) given: See MAR Post HD VS: T 98.2-[id number] B/P113/49 Post HD weight: 81.8KG  Neville Seip, RN [facility]   10/10/23 1915  Vitals  Temp 98.2 F (36.8 C)  Temp Source Oral  BP (!) 113/49  MAP (mmHg) 66  Patient Position (if appropriate) Lying  Pulse Rate 71  Pulse Rate Source Monitor  ECG Heart Rate 71  Resp (!) 29  Weight 81.8 kg  Type of Weight Post-Dialysis  Oxygen  Therapy  SpO2 100 %  During Treatment Monitoring  Blood Flow Rate (mL/min) 0 mL/min  Arterial Pressure (mmHg) 16.77 mmHg  Venous Pressure (mmHg) -26.06 mmHg  TMP (mmHg) 12.12 mmHg  Ultrafiltration Rate (mL/min) 620 mL/min  Dialysate Flow Rate (mL/min) 299 ml/min  Duration of HD Treatment -hour(s) 1.47 hour(s)  Cumulative Fluid Removed (mL) per Treatment  102.36  Intra-Hemodialysis Comments Tx completed;See progress note  Post Treatment  Dialyzer Clearance Clear  Liters Processed 33  Fluid Removed (mL) 100 mL  Tolerated HD Treatment No (Comment) (signed off early)  Hemodialysis Catheter Right Internal jugular Double-lumen;Permanent  Placement Date/Time: 12/28/15 1732   Time Out: Correct patient;Correct site;Correct procedure  Maximum sterile barrier precautions: Hand hygiene;Sterile gown;Sterile gloves;Large sterile sheet;Mask;Cap  Site  Prep: Chlorhexidine   Local Anesthetic: Inje...  Site Condition No complications  Blue Lumen Status Antimicrobial dead end cap;Heparin  locked;Flushed  Red Lumen Status Flushed;Antimicrobial dead end cap;Heparin  locked  Purple Lumen Status N/A  Catheter fill solution Heparin  1000 units/ml  Catheter fill volume (Arterial) 1.6 cc  Catheter fill volume (Venous) 1.6  Dressing Type Transparent  Dressing Status Antimicrobial disc/dressing in place;Clean, Dry, Intact  Drainage Description None  Dressing Change Due 10/13/23  Post treatment catheter status Capped and Clamped

## 2023-10-11 NOTE — Progress Notes (Signed)
 PHARMACY - ANTICOAGULATION CONSULT NOTE  Pharmacy Consult for heparin  Indication: atrial fibrillation  Allergies  Allergen Reactions   Epoetin  Alfa Other (See Comments)    Unknown    Ferumoxytol  Other (See Comments)    Unknown    Morphine Sulfate Rash and Other (See Comments)    Itches all over, red spots    Patient Measurements: Height: 5' 10 (177.8 cm) Weight: 81.8 kg (180 lb 5.4 oz) IBW/kg (Calculated) : 73 HEPARIN  DW (KG): 82.1  Vital Signs: Temp: 98 F (36.7 C) (10/01 2000) Temp Source: Axillary (10/01 2000) BP: 123/53 (10/01 2115) Pulse Rate: 66 (10/01 2115)  Labs: Recent Labs    10/10/23 1626 10/11/23 0620 10/11/23 0931 10/11/23 0949 10/11/23 1228 10/11/23 1252 10/11/23 1258 10/11/23 1325 10/11/23 1635 10/11/23 2024  HGB 12.2* 13.7   < > 12.8*  --   --   --  12.1*  --  11.7*  HCT 36.6* 41.9   < > 38.4*  --   --   --  36.6*  --  34.4*  PLT 304 474*  --  470*  --   --   --   --   --   --   APTT  --   --   --   --  42*  --   --   --   --  >200*  HEPARINUNFRC  --   --   --   --   --  >1.10*  --   --   --  >1.10*  CREATININE  --  10.18*  --  10.48*  --   --   --   --  10.59*  --   TROPONINIHS  --   --   --   --   --   --  897*  --  1,127*  --    < > = values in this interval not displayed.    Estimated Creatinine Clearance: 7.5 mL/min (A) (by C-G formula based on SCr of 10.59 mg/dL (H)).  Assessment: Patient is a 63 year old admitted for septic shock requiring multiple pressors and pleural effusion, complicated by respiratory arrest. He has a history of atrial fibrillation on Eliquis  PTA. Last dose Eliquis  10/09/23 AM.  Pharmacy consulted to dose IV heparin .  Heparin  level and aPTT are both elevated.  Confirmed with RN - lab was obtained from the A-line so it is not contaminated.  No bleeding observed.  Goal of Therapy:  Heparin  level 0.3-0.7 units/ml aPTT 66-102 seconds Monitor platelets by anticoagulation protocol: Yes   Plan:  Hold IV heparin  for  1.5 hrs (RN aware) At midnight, resume IV heparin  at 950 units/hr Check 8 hr aPTT  Urvi Imes D. Lendell, PharmD, BCPS, BCCCP 10/11/2023, 10:25 PM

## 2023-10-11 NOTE — Progress Notes (Signed)
 Upon responding to Code [employer] ministered to opt's wife, engaging in a ministry of presence as wife spoke of the 8 year journey in dialysis her husband and she have experienced.  Wife reports she and her husband have talked about him dying as he has become weaker.  Their faith is strong.  Chaplain endorsed wife's expression of faith and yet she is grieving.  We talked about how faith and grief are both true at the same time.  Chaplain Kubra stepped in to continue pastoral care of the wife at this time.  Rock Orange Chaplain

## 2023-10-11 NOTE — Procedures (Signed)
 Intubation Procedure Note  Anthony Bullock  [id number]  [birth year]  Date:10/11/23  Time:10:43 AM   Provider Performing:Retina Bernardy    Procedure: Intubation (31500)  Indication(s) Respiratory Failure  Consent Unable to obtain consent due to emergent nature of procedure.   Anesthesia Intubated during the code   Time Out Verified patient identification, verified procedure, site/side was marked, verified correct patient position, special equipment/implants available, medications/allergies/relevant history reviewed, required imaging and test results available.   Sterile Technique Usual hand hygeine, masks, and gloves were used   Procedure Description Patient positioned in bed supine.  Sedation given as noted above.  Patient was intubated with endotracheal tube using Glidescope.  View was Grade 1 full glottis .  Number of attempts was 1.  Colorimetric CO2 detector was consistent with tracheal placement.   Complications/Tolerance None; patient tolerated the procedure well. Chest X-ray is ordered to verify placement.   EBL Minimal   Specimen(s) None   Tamela Stakes, MD  Attending Physician, Critical Care Medicine [facility] See Amion for pager If no response to pager, please call [phone] until 7pm After 7pm, Please call E-link [phone]

## 2023-10-11 NOTE — Progress Notes (Signed)
   [address], [place] [address]             [id number]     Subjective: Intubated, sedated  Objective: Vital signs in last 24 hours: Temp:  [97.5 F (36.4 C)-98.8 F (37.1 C)] 97.5 F (36.4 C) (10/01 1200) Pulse Rate:  [60-124] 90 (10/01 1330) Cardiac Rhythm: Normal sinus rhythm (10/01 0800) Resp:  [4-55] 24 (10/01 1330) BP: (56-145)/(16-104) 128/55 (10/01 1330) SpO2:  [38 %-100 %] 100 % (10/01 1330) Arterial Line BP: (68-139)/(27-50) 123/42 (10/01 1330) FiO2 (%):  [80 %-100 %] 80 % (10/01 1110) Weight:  [81.8 kg-81.9 kg] 81.8 kg (09/30 1915)  Hemodynamic parameters for last 24 hours:    Intake/Output from previous day: 09/30 0701 - 10/01 0700 In: 885.5 [P.O.:170; I.V.:149.2; IV Piggyback:566.2] Out: 100  Intake/Output this shift: Total I/O In: 1367.3 [I.V.:753.8; IV Piggyback:613.6] Out: 1300 [Chest Tube:1300]  General appearance: toxic Neurologic: sedated, but responsive earlier Heart: regular rate and rhythm Lungs: clear anteriorly  Lab Results: Recent Labs    10/11/23 0620 10/11/23 0931 10/11/23 0949 10/11/23 1325  WBC 27.1*  --  31.8*  --   HGB 13.7   < > 12.8* 12.1*  HCT 41.9   < > 38.4* 36.6*  PLT 474*  --  470*  --    < > = values in this interval not displayed.   BMET:  Recent Labs    10/11/23 0620 10/11/23 0931 10/11/23 0949  NA 131* 131* 131*  K 4.5 4.7 4.8  CL 95*  --  92*  CO2 19*  --  16*  GLUCOSE 184*  --  284*  BUN 53*  --  58*  CREATININE 10.18*  --  10.48*  CALCIUM  7.8*  --  7.6*    PT/INR: No results for input(s): LABPROT, INR in the last 72 hours. ABG    Component Value Date/Time   PHART 7.319 (L) 10/11/2023 0931   HCO3 17.7 (L) 10/11/2023 0931   TCO2 19 (L) 10/11/2023 0931   ACIDBASEDEF 8.0 (H) 10/11/2023 0931   O2SAT 99 10/11/2023 0931   CBG (last 3)  Recent Labs    10/10/23 2033 10/11/23 0513 10/11/23 1129  GLUCAP 124* 158* 269*    Assessment/Plan: Events of past 24 hours  noted PEA arrest s/p resuscitation Intubated and sedated, on multiple pressors- epi !9, norepi 37, vasopressin  0.04 Chest tube placed earlier today- 1.5 L of amber fluid with some clots on container, tube currently clamped Not a candidate for surgical biopsy, hopefully can get IR needle biopsy tomorrow if stable enough for transport    LOS: 4 days    Anthony Bullock 10/11/2023

## 2023-10-11 NOTE — Progress Notes (Signed)
 eLink Physician-Brief Progress Note Patient Name: Anthony Bullock DOB: 15-May-1960 MRN: [id number]   Date of Service  10/11/2023  HPI/Events of Note  63 year old with a history of COPD, ESRD, heart failure and PVD status post BKA presents with chest pain and shortness of breath transferred to the ICU for worsening dyspnea and hypotension.  Patient is hypotensive saturating 92% on 3 L of oxygen  and norepinephrine  at 16 mcg.  Results are consistent with ESRD and leukocytosis.  CT with no PE but significantly worsened right sided lung collapse with complex pleural effusion.  A.m. labs are still pending  eICU Interventions  A.m. CBC is pending.  Trend hemoglobin every 6 hours  Maintain norepinephrine  as needed for MAP, add vasopressin .  Could consider some albumin   Empiric antibiotics cultures in place.  May benefit from repeat thoracentesis  DVT prophylaxis with SCDs GI prophylaxis not indicated     Intervention Category Evaluation Type: New Patient Evaluation  [name] 10/11/2023, 6:18 AM

## 2023-10-11 NOTE — Procedures (Signed)
 Central Venous Catheter Insertion Procedure Note  Anthony Bullock  [id number]  30-May-1960  Date:10/11/23  Time:9:25 AM   Provider Performing:Gino Garrabrant D Emilio   Procedure: Insertion of Non-tunneled Central Venous Catheter(36556) with US  guidance (23062)   Indication(s) Medication administration  Consent Unable to obtain consent due to emergent nature of procedure.  Anesthesia Topical only with 1% lidocaine    Timeout Verified patient identification, verified procedure, site/side was marked, verified correct patient position, special equipment/implants available, medications/allergies/relevant history reviewed, required imaging and test results available.  Sterile Technique Maximal sterile technique including full sterile barrier drape, hand hygiene, sterile gown, sterile gloves, mask, hair covering, sterile ultrasound probe cover (if used).  Procedure Description Area of catheter insertion was cleaned with chlorhexidine  and draped in sterile fashion.  With real-time ultrasound guidance a central venous catheter was placed into the left internal jugular vein. Nonpulsatile blood flow and easy flushing noted in all ports.  The catheter was sutured in place and sterile dressing applied.  Complications/Tolerance None; patient tolerated the procedure well. Chest X-ray is ordered to verify placement for internal jugular or subclavian cannulation.   Chest x-ray is not ordered for femoral cannulation.  EBL Minimal  Specimen(s) None  JD Emilio RIGGERS [facility] 10/11/2023, 9:26 AM  Please see [email or web address] for pager details.  From 7A-7P if no response, please call [phone]. After hours, please call ELink [phone].

## 2023-10-11 NOTE — Progress Notes (Addendum)
 Pt called out with CC of SOB. Vitals were taken and he had SAT of 80's and dyspnea. Notified MD, pt was also hypotensive. Gave medication See MAR. No progress so we called a rapid. Nurse came to bedside to evaluate and so did providers. No improvement so Pt was transferred to ICU.

## 2023-10-11 NOTE — Procedures (Signed)
 Cardiopulmonary Resuscitation Note  YORDI KRAGER  [id number]  1960-12-29  Date:10/11/23  Time:10:45 AM   Provider Performing:Miki Labuda   Procedure: Cardiopulmonary Resuscitation [phone])  Indication(s) Loss of Pulse  Consent N/A  Anesthesia N/A   Time Out N/A   Sterile Technique Hand hygiene, gloves   Procedure Description Called to patient's room for CODE BLUE. Initial rhythm was PEA/Asystole. Patient received high quality chest compressions for 10 minutes with defibrillation or cardioversion when appropriate. Epinephrine  was administered every 3 minutes as directed by time Biomedical engineer. Additional pharmacologic interventions included magnesium  and sodium bicarbonate. Additional procedural interventions include intra-osseus line and intubation.  Return of spontaneous circulation was achieved.  Family called and notified.   Complications/Tolerance N/A   EBL N/A   Specimen(s) N/A  Estimated time to ROSC: 10 minutes  Tamela Stakes, MD  Attending Physician, Critical Care Medicine [facility] See Amion for pager If no response to pager, please call [phone] until 7pm After 7pm, Please call E-link [phone]

## 2023-10-11 NOTE — Progress Notes (Signed)
 Progress Note   Patient: Anthony Bullock FMW:[id number] DOB: [birth year] DOA: 10/06/2023     4 DOS: the patient was seen and examined on 10/11/2023   Brief hospital course:  63 year old male with past medical history of COPD ESRD, hypertension, HFrEF EF 40%, paroxysmal atrial fibrillation on Eliquis , CAD, PVD status post bilateral BKA presented with history of shortness of breath and right-sided chest pain radiating to right shoulder. Patient with sinus congestion with yellow drainage.  No fevers.  Says he was short of breath on presentation with cough which was yellow.  Status post 2 thoracentesis [9/26 with removal of 1.85 L, 9/28 with removal of 520 mL].  His breathing is improved and now he feels at his baseline.  Minimal cough now.   In April he had CAD status post PCI. In May he had CHF exacerbation with admission. Noticed to have right-sided effusion in May 2025.   Intentional weight loss of 6 kg. Ex-smoker quit 13 years ago and has 64-pack-year history. Used to work in [employer] and had exposure to lint dust.   CT chest done post thoracentesis which showed pleural nodularity with nodular pleural thickening inferolaterally extending into right sided chest wall.  There are fractures of 5-7 ribs.  17 mm GGO in left upper lobe and 5 mm left lower lobe pulmonary nodule.  There is a 13 mm paraesophageal lymph node.  Loculated pleural effusion on the right. Fluid studies: Neutrophil 93%, LDH 660 with normal glucose, protein 5.5.    10/1: Patient became hypotensive requiring Levophed .  Increasing WBC. Patient without complaints. Patient being followed by PCCM for pulmonary. Labs and scans reviewed-CT chest with contrast 9/28- not a PE protocol study, nodularity suggestive of possible malignancy, loculated large right pleural effusion.  Assessment and Plan: Hypotension requiring IV pressors Increased WBC may suggest sepsis However, given potential for malignancy, Pulmonary embolism  should be consider. He was hypotensive on Tuesday during dialysis and so did not finish the whole session. Patient to be transferred to 4N-ICU Will order CTA pulmonary embolism protocol Start empiric antibiotics    Subjective: awake, NAD  Physical Exam: Vitals:   10/11/23 0449 10/11/23 0452 10/11/23 0455 10/11/23 0458  BP: (!) 110/93 (!) 109/36 104/60 (!) 78/35  Pulse:    70  Resp:    18  Temp:      TempSrc:      SpO2: 93% 96% 93% 99%  Weight:      Height:       Physical Exam:  General: not in distress patient appearing healthy Lungs: clear to auscultation bilaterally.  Diminished breath sound on the right. Heart: regular rate rhythm, no murmur appreciated.  Abdomen: non tender, non distended. Normal BS.  Neuro: axox 3.  Moving all extremities Bilateral BKA.  Data Reviewed:      Disposition: Status is: Inpatient ICU Remains inpatient appropriate because:      The patient is critically ill with multiple organ system failure and requires high complexity decision making for assessment and support, frequent evaluation and titration of therapies, advanced monitoring, review of radiographic studies and interpretation of complex data.   Critical Care Time devoted to patient care services, exclusive of separately billable procedures, described in this note is 32 minutes.   Orlin Fairly, MD [facility] See Amion for pager  If no response to pager , please call [phone] until 7pm After 7:00 pm call Elink  [phone] 10/11/2023, 5:14 AM   Author: Orlin Fairly, MD  10/11/2023 5:00 AM  For on call review [email or web address].

## 2023-10-11 NOTE — Procedures (Signed)
 Insertion of Chest Tube Procedure Note  Anthony Bullock  [id number]  January 16, 1960  Date:10/11/23  Time:10:35 AM    Provider Performing: Tamela Stakes   Procedure: Pleural Catheter Insertion w/ Imaging Guidance (67442)  Indication(s) Effusion  Consent Risks of the procedure as well as the alternatives and risks of each were explained to the patient and/or caregiver.  Consent for the procedure was obtained and is signed in the bedside chart  Anesthesia Topical only with 1% lidocaine     Time Out Verified patient identification, verified procedure, site/side was marked, verified correct patient position, special equipment/implants available, medications/allergies/relevant history reviewed, required imaging and test results available.   Sterile Technique Maximal sterile technique including full sterile barrier drape, hand hygiene, sterile gown, sterile gloves, mask, hair covering, sterile ultrasound probe cover (if used).   Procedure Description Ultrasound used to identify appropriate pleural anatomy for placement and overlying skin marked. Area of placement cleaned and draped in sterile fashion.  A 14 French pigtail pleural catheter was placed into the right pleural space using Seldinger technique. Appropriate return of fluid was obtained.  The tube was connected to atrium and placed on -20 cm H2O wall suction.   Complications/Tolerance There were no pulmonary complications postprocedure however patient was in septic shock needing high-dose vasopressor therapy prior to procedure and during procedure he became very hypotensive and went into tach arrest.  Chest tube surgeon was completed before this therefore he was immediately laid down and resuscitated. Chest X-ray is ordered to verify placement.   EBL Minimal  Specimen(s) fluid pleural fluid is sent for chemistry and cytology and cultures.  POCUS image of the right hemithorax prior to procedure showing pleural effusion

## 2023-10-11 NOTE — Progress Notes (Signed)
 [facility] Progress Note   Subjective: Pt coded x 2 this am, now intubated on pressors Wife at bedside, says he gave her the thumbs up post code  Objective Vitals:   10/11/23 0915 10/11/23 0930 10/11/23 0945 10/11/23 1000  BP: (!) 80/40  (!) 107/48 (!) 122/36  Pulse: 66 75 79 83  Resp: (!) 24 (!) 24 (!) 24 (!) 24  Temp:      TempSrc:      SpO2: 99% 100% 100% 100%  Weight:      Height:       Physical Exam Gen on vent, sedated Sclera anicteric, throat w/ ETT No jvd or bruits Chest clear anterior/ lateral RRR no MRG Abd soft ntnd no mass or ascites +bs GU nl male Ext bilat BKA, no edema Neuro is on vent, sedated TDC in R chest   OP HD: TTS [facility] 4h  B400   82.5kg  1K bath  TDC  Hep none - Calcitriol  0.81mcg PO q HD - Cinacalcet  90mg  PO q HD   Assessment/Plan: R loc pleural effusion/ RLL consolidation vs atx: w/ extensive R pleural nodularity and possibly pathologic rib fx's. Prob PNA and possibly malignancy. SP 1.8 L thora on 9/26, then 500 cc on 9/28. Per CCM.   ESRD: HD on TTS. Had 2 hrs HD yesterday. Now is s/p code blue in ICU on vent and 3 pressors. No need for HD today. May need CRRT if remains on pressors.  Shock: getting pressor support x 3 (levo, vaso, epi) Anemia of ESRD: Hgb 13, no ESA needed - contraindicated at this time in setting of presumed cancer Dx. Secondary HPTH: CorrCa/Phos ok - not getting binders here - follow for now. Nutrition: Alb low, continue supplements. A-fib T2DM   Rob Geralynn  MD  [facility] 10/11/2023, 11:02 AM  Recent Labs  Lab 10/09/23 0258 10/10/23 0206 10/10/23 1626 10/11/23 0620 10/11/23 0931 10/11/23 0949  HGB  --  13.1   < > 13.7 12.9* 12.8*  ALBUMIN  2.2* 2.2*  --  1.9*  --   --   CALCIUM  8.4* 8.4*  --  7.8*  --   --   PHOS 3.9 5.2*  --   --   --   --   CREATININE 10.09* 12.07*  --  10.18*  --   --   K 3.6 3.9  --  4.5 4.7  --    < > = values in this interval not displayed.    Inpatient  medications:  arformoterol  15 mcg Nebulization Q12H   atorvastatin   80 mg Oral QHS   budesonide (PULMICORT) nebulizer solution  0.5 mg Nebulization BID   Chlorhexidine  Gluconate Cloth  6 each Topical Q0600   clopidogrel   75 mg Oral Q breakfast   etomidate       feeding supplement (NEPRO CARB STEADY)  237 mL Oral BID BM   fentaNYL  (SUBLIMAZE ) injection  25-50 mcg Intravenous Once   hydrocortisone  sod succinate (SOLU-CORTEF ) inj  100 mg Intravenous Q12H   insulin  aspart  0-9 Units Subcutaneous Q4H   insulin  glargine  16 Units Subcutaneous QHS   lidocaine  (PF)       midodrine   10 mg Per Tube Q8H   multivitamin  1 tablet Oral Daily   revefenacin  175 mcg Nebulization Daily   rocuronium        saccharomyces boulardii  250 mg Oral BID   sodium chloride  flush  3 mL Intravenous Q12H   succinylcholine   azithromycin  Stopped (10/10/23 0946)   ceFEPime  (MAXIPIME ) IV Stopped (10/11/23 0650)   epinephrine  10 mcg/min (10/11/23 1000)   fentaNYL  infusion INTRAVENOUS 50 mcg/hr (10/11/23 1000)   norepinephrine  (LEVOPHED ) Adult infusion 60 mcg/min (10/11/23 1000)   [START ON 10/12/2023] vancomycin      vasopressin  0.04 Units/min (10/11/23 1000)   acetaminophen  **OR** acetaminophen , etomidate, fentaNYL , ipratropium-albuterol , lidocaine  (PF), ondansetron  (ZOFRAN ) IV, oxyCODONE , perflutren  lipid microspheres (DEFINITY ) IV suspension, rocuronium , succinylcholine 

## 2023-10-11 NOTE — Consult Note (Addendum)
 NAME:  Anthony Bullock, MRN:  [id number], DOB:  July 19, 1960, LOS: 4 ADMISSION DATE:  10/06/2023, CONSULTATION DATE:  10/09/23 REFERRING MD:  Dr. Trixie, CHIEF COMPLAINT:  SOB    History of Present Illness:   53 yoM with PMH significant for COPD, ESRD TTS HD, HTN, HLD, HFrEF EF 40, HFpEF, PAF on Eliquis , CAD on plavix , DMT2, PVD s/p bilateral BKA, anemia, GIB who presented 9/26 with one month hx of SOB and intermittent right sided chest pain radiating into R shoulder.  No cough or fever. CXR performed while at HD 9/25 which showed right pleural effusion, recommended further evaluation and sent to ER.   Former smoker, quit 13 yrs ago.  Has 64 pack yr history.  Pt is not always forth coming with specific information, wife at bedside is helpful.  Pt reports no recent fever or chills, N/V.  Has had sinus congestion with yellow drainage and tint of blood for several days and as of yesterday having intermittent yellowish productive cough.  Does report exertional dyspnea and intermittent pleuritic pain with inspiration and R shoulder pain for at least 4 weeks.  Poor appetite over last week and reports weight loss, ?20lbs but on chart review, is down ~6kg since May.  Also, wife reports pt had fall with resultant right sided chest pain after hospitalization in May for pulmonary edema and April for GIB and s/p PCI to LAD/ RCA 04/12/23; pt did not seek evaluation after fall.  Also had fall last week after getting off lawn mower after prolonged use, denies hitting head. Wife reports it is not uncommon for him to fall due to weakness after HD and ongoing use of BLE prosthetics. On imaging review, pt has had moderate right pleural effusion since CXR in May 2025.  On admit, required 4L [place].  Afebrile and normal WBC/ differential. LFTs normal. Admitted to [facility].  Underwent IR thoracentesis on 9/26 with 1.85L of amber colored pleural fluid however was not sent for studies.  CXR 9/28 showed increasing R pleural effusion, tracking  laterally and underwent IR thoracentesis with of amber fluid which was sent for cell count and cytology.  Pt did report improvement in SOB after thoracentesis's and weaned to room air 9/28.  Chest CT w/ contrast performed after thoracentesis which showed extensive pleural nodularity in right chest, posterior right pleural nodule measures 4x1, with plaque-like nodular pleural thickening inferolaterally extending into right chest wall with pathologic fracture of anterior 5th and destruction of 6/7 ribs suspicious for malignancy, 17mm GGO in LUL, 5mm LLL pulmonary nodule, 13 mm paraesophageal lymph node, and RUL an RML volume loss with collapse/ consolidation in RLL with areas of loculated pleural fluid in right upper hemithorax.  Pleural fluid noted straw colored, LDH 660, neutrophil predominant, glucose 160, protein 5.5.  Pulmonary consulted for further recommendations.   On 10/1, Patient became hypotensive requiring Levophed .  Increasing WBC. Patient without complaints. Patient being followed by PCCM for pulmonary. Labs and scans reviewed-CT chest with contrast 9/28- not a PE protocol study, nodularity suggestive of possible malignancy, loculated large right pleural effusion.  Pertinent  Medical History  Former smoker, COPD, ESRD TTS HD, hypotension on midodrine , HTN, HLD, HFrEF, PAF, CAD, DMT2, peripheral neuropathy, PVD s/p bilateral BKA, anemia, GIB  Significant Hospital Events: Including procedures, antibiotic start and stop dates in addition to other pertinent events   9/26 admit, IR R thora 1.85 ml- no fluid sent 9/28 IR thora 520 ml, pleural studies sent 10/1 transferred to icu with  hypotension started on pressors and respiratory distress; brief cardiac arrest rosc 10 minutes; intubated  Interim History / Subjective:  PCCM arrived to bedside on 10/1 patient tachypneic and hypoxic. Placed on nrb. CTA chest no pe; large right pleural effusion. Chest tube placed. Shortly after patient  became more hypoxic and went into vtac. Was pulseless. CPR started. After placing pads rhythm coarse vfib shocked. Intubated during code. Given epi, bicarb, mag. ROSC about 10 minutes. A-line and central line placed post code. On multiple pressors. Patient waking up tracking with eyes post code. Given some light sedation. Family updated  Objective    Blood pressure (!) 85/55, pulse 77, temperature 98.1 F (36.7 C), temperature source Oral, resp. rate (!) 24, height 5' 10 (1.778 m), weight 81.8 kg, SpO2 91%.    FiO2 (%):  [2 %] 2 %   Intake/Output Summary (Last 24 hours) at 10/11/2023 0803 Last data filed at 10/11/2023 9340 Gross per 24 hour  Intake 885.46 ml  Output 100 ml  Net 785.46 ml   Filed Weights   10/07/23 1230 10/10/23 1734 10/10/23 1915  Weight: 84.4 kg 81.9 kg 81.8 kg   Examination: General:  critically ill appearing on mech vent HEENT: MM pink/moist; ETT in place Neuro: before arrest aox3; now intubated w/ some light sedation; eyes open to voice tracking; perrl; mae spontaneously CV: s1s2, RRR, no m/r/g PULM:  more dim bs on right; on mech vent PRVC GI: soft, bsx4 active  Extremities: warm/dry, b/l bka Skin: no rashes or lesions   Resolved problem list   Assessment and Plan   Cardiac arrest: likely respiratory related given hypoxia and resp distress; went into vtac and pulseless; then rhythm showing coarse vfib when pads placed requiring shock. Rosc 10 minutes Shock: presumed septic Plan: -continue pressors levo and vaso add epi for MAP goal >65 -given albumin ; check cvp -add stress dose steroids -trend troponin and lactate -check ABG; consider more bicarb -check CMP, Mag: replete electrolytes as needed -echo -f/u bcx2, trach culture and pleural culture -qtc prolonged; change azithro to doxy; mrsa pcr negative dc vanc; change cefepime  to zosyn  -CXR -patient appears to be waking up will hold off on TTM normothermia protocol  -12 lead EKG; consider cards  consult -cont midodrine   Acute respiratory failure w/ hypoxia Right loculated pleural effusion RLL consolidation vs atelectasis with extensive right pleural nodularity with suspected pathologic right rib anterior rib fx 5/6/ 7, LUL GGO and LLL pleural nodule Possible PNA Possible lung malignancy Former smoker Hx of COPD? Right effusion dating back to May 2025 after admit for pulmonary edema/ hypervolemia ddx concerning for malignancy, possible infectious vs inflammatory etiology.   Plan: -cxr -LTVV strategy with tidal volumes of 6-8 cc/kg ideal body weight -check ABG and adjust settings accordingly  -Goal plateau pressures less than 30 and driving pressures less than 15 -Wean PEEP/FiO2 for SpO2 >92% -VAP bundle in place -Daily SAT and SBT when appropriate -PAD protocol in place -wean sedation for RASS goal 0 to -1 -CT placed and already put out 1.8 L. Will clamp for now w/ worsening shock. Pleural fluid studies, culture, cytology sent. -send trach aspirate -abx as above -IR consulted for perc biopsy to rule out malignancy when stable -trend cxr -triple therapy nebs; prn duoneb  ESRD on HD Hyponatremia Plan: -nephro following -will likely start on CRRT -Trend BMP / urinary output -Replace electrolytes as indicated -Avoid nephrotoxic agents, ensure adequate renal perfusion  PAF Plan: -hold po amio; consider iv -hold carvedilol  with  shock -apixaban  on hold; if h/h stable start heparin  iv  Chronic systolic chf HLD CAD -Repeat 2D echo 9/28 shows LVEF 30 to 35%, global hypokinesis, grade 1 diastolic dysfunction  Plan: -CRRT for volume removal -daily weights; strict I/o's -statin -cont plavix   T2DM Plan: -ssi and cbg monitoring -basal insulin   Best Practice (right click and Reselect all SmartList Selections daily)   Diet/type: NPO w/ meds via tube DVT prophylaxis: SCD; likely iv heparin  pending cbc GI prophylaxis: H2B Lines: Central line, Dialysis Catheter,  and Arterial Line Foley:  N/A Code Status:  full code Last date of multidisciplinary goals of care discussion [10/1 updated wife at bedside. Going to discuss with family to discuss dnr. States he would not want to be on vent long term.]   Labs   CBC: Recent Labs  Lab 10/06/23 1359 10/07/23 0523 10/10/23 0206 10/10/23 1626 10/11/23 0620  WBC 9.9 10.1 19.7* 21.4* 27.1*  NEUTROABS 7.0  --   --   --  23.0*  HGB 13.6 13.1 13.1 12.2* 13.7  HCT 42.3 39.9 39.9 36.6* 41.9  MCV 88.3 87.9 87.3 85.7 86.9  PLT 240 229 301 304 474*    Basic Metabolic Panel: Recent Labs  Lab 10/07/23 0523 10/08/23 0244 10/09/23 0258 10/10/23 0206 10/11/23 0620  NA 133* 133* 132* 134* 131*  K 3.6 3.6 3.6 3.9 4.5  CL 96* 96* 95* 96* 95*  CO2 24 24 22  21* 19*  GLUCOSE 154* 170* 212* 172* 184*  BUN 42* 30* 47* 62* 53*  CREATININE 9.42* 7.66* 10.09* 12.07* 10.18*  CALCIUM  9.0 8.6* 8.4* 8.4* 7.8*  MG  --   --   --   --  1.9  PHOS 4.1 3.3 3.9 5.2*  --    GFR: Estimated Creatinine Clearance: 7.8 mL/min (A) (by C-G formula based on SCr of 10.18 mg/dL (H)). Recent Labs  Lab 10/07/23 0523 10/10/23 0206 10/10/23 1626 10/11/23 0620  PROCALCITON  --  2.96  --   --   WBC 10.1 19.7* 21.4* 27.1*  LATICACIDVEN  --   --   --  1.8    Liver Function Tests: Recent Labs  Lab 10/06/23 1400 10/07/23 0523 10/08/23 0244 10/09/23 0258 10/10/23 0206 10/11/23 0620  AST 24  --   --   --   --  35  ALT 17  --   --   --   --  25  ALKPHOS 101  --   --   --   --  114  BILITOT 0.5  --   --   --   --  1.0  PROT 7.1  --   --   --   --  6.5  ALBUMIN  2.7* 2.4* 2.4* 2.2* 2.2* 1.9*   Recent Labs  Lab 10/06/23 1400  LIPASE 25   No results for input(s): AMMONIA in the last 168 hours.  ABG    Component Value Date/Time   PHART 7.33 (L) 03/07/2023 0006   PCO2ART 34 03/07/2023 0006   PO2ART 41 (L) 03/07/2023 0006   HCO3 24.4 05/21/2023 2233   TCO2 25 05/21/2023 2233   ACIDBASEDEF 7.2 (H) 03/07/2023 0006    O2SAT 91 05/21/2023 2233     Coagulation Profile: No results for input(s): INR, PROTIME in the last 168 hours.  Cardiac Enzymes: No results for input(s): CKTOTAL, CKMB, CKMBINDEX, TROPONINI in the last 168 hours.  HbA1C: Hemoglobin A1C  Date/Time Value Ref Range Status  09/20/2023 02:10 PM 6.7 (A) 4.0 - 5.6 %  Final  06/19/2023 01:57 PM 5.8 (A) 4.0 - 5.6 % Final  02/15/2018 12:00 AM 11.6  Final    Comment:    [facility] in [place]   Hgb A1c MFr Bld  Date/Time Value Ref Range Status  11/13/2019 04:43 PM 6.5 (H) 4.8 - 5.6 % Final    Comment:    (NOTE) Pre diabetes:          5.7%-6.4%  Diabetes:              >6.4%  Glycemic control for   <7.0% adults with diabetes   07/10/2019 04:44 AM 8.1 (H) 4.8 - 5.6 % Final    Comment:    (NOTE)         Prediabetes: 5.7 - 6.4         Diabetes: >6.4         Glycemic control for adults with diabetes: <7.0     CBG: Recent Labs  Lab 10/10/23 0831 10/10/23 1209 10/10/23 1606 10/10/23 2033 10/11/23 0513  GLUCAP 171* 192* 178* 124* 158*    Review of Systems:   Patient is sedate and/or intubated; therefore, history has been obtained from chart review.     Past Medical History:  He,  has a past medical history of AICD (automatic cardioverter/defibrillator) present, Allergic rhinitis, Anemia, Arthritis, Chronic systolic heart failure (HCC), COPD (chronic obstructive pulmonary disease) (HCC), Diabetes mellitus type II, Diabetic nephropathy (HCC), Diabetic neuropathy (HCC), ESRD on hemodialysis (HCC), History of cardiac catheterization, History of kidney stones, Hyperlipidemia, Hypertension, Kidney stones, NICM (nonischemic cardiomyopathy) (HCC), Osteomyelitis (HCC), Pneumonia, Urethral stricture, and Wears glasses.   Surgical History:   Past Surgical History:  Procedure Laterality Date   ABDOMINAL AORTOGRAM W/LOWER EXTREMITY N/A 03/30/2016   Procedure: Abdominal Aortogram w/Lower Extremity;  Surgeon: Lonni GORMAN Blade, MD;  Location: [facility] INVASIVE CV LAB;  Service: Cardiovascular;  Laterality: N/A;   AMPUTATION Right 04/26/2016   Procedure: Right Below Knee Amputation;  Surgeon: Jerona Harden GAILS, MD;  Location: [facility] OR;  Service: Orthopedics;  Laterality: Right;   AMPUTATION Left 08/21/2019   Procedure: LEFT FOOT 5TH RAY AMPUTATION;  Surgeon: Harden Jerona GAILS, MD;  Location: [facility] OR;  Service: Orthopedics;  Laterality: Left;   AMPUTATION Left 11/13/2019   Procedure: LEFT BELOW KNEE AMPUTATION;  Surgeon: Harden Jerona GAILS, MD;  Location: [facility] OR;  Service: Orthopedics;  Laterality: Left;   AV FISTULA PLACEMENT Right 09/08/2015   Procedure: INSERTION OF 4-35mm x 45cm  ARTERIOVENOUS (AV) GORE-TEX GRAFT RIGHT UPPER  ARM;  Surgeon: Lonni GORMAN Blade, MD;  Location: [facility] OR;  Service: Vascular;  Laterality: Right;   AV FISTULA PLACEMENT Left 01/14/2016   Procedure: CREATION OF LEFT UPPER ARM ARTERIOVENOUS FISTULA;  Surgeon: Lonni GORMAN Blade, MD;  Location: [facility] OR;  Service: Vascular;  Laterality: Left;   BASCILIC VEIN TRANSPOSITION Right 08/22/2014   Procedure: RIGHT UPPER ARM BASCILIC VEIN TRANSPOSITION;  Surgeon: Lonni GORMAN Blade, MD;  Location: [facility] OR;  Service: Vascular;  Laterality: Right;   BELOW KNEE LEG AMPUTATION Right 04/26/2016   BIOPSY OF SKIN SUBCUTANEOUS TISSUE AND/OR MUCOUS MEMBRANE  04/28/2023   Procedure: BIOPSY, SKIN, SUBCUTANEOUS TISSUE, OR MUCOUS MEMBRANE;  Surgeon: Shila Gustav GAILS, MD;  Location: [facility] ENDOSCOPY;  Service: Gastroenterology;;   CARDIAC CATHETERIZATION     CARDIAC DEFIBRILLATOR PLACEMENT  06/27/2013   Sub Q       BY DR FERNANDE   CATARACT EXTRACTION W/PHACO Right 08/06/2018   Procedure: CATARACT EXTRACTION PHACO AND INTRAOCULAR LENS PLACEMENT (IOC);  Surgeon: Harrie Agent, MD;  Location: [facility] ORS;  Service: Ophthalmology;  Laterality: Right;  CDE: 4.06   CATARACT EXTRACTION W/PHACO Left 08/20/2018   Procedure: CATARACT EXTRACTION PHACO AND INTRAOCULAR LENS PLACEMENT (IOC);  Surgeon:  Harrie Agent, MD;  Location: [facility] ORS;  Service: Ophthalmology;  Laterality: Left;  CDE: 6.76   COLONOSCOPY WITH PROPOFOL  N/A 07/22/2015   Procedure: COLONOSCOPY WITH PROPOFOL ;  Surgeon: Victory LITTIE Legrand DOUGLAS, MD;  Location: [facility] ENDOSCOPY;  Service: Gastroenterology;  Laterality: N/A;   CORONARY LITHOTRIPSY N/A 04/14/2023   Procedure: CORONARY LITHOTRIPSY;  Surgeon: Anner Alm ORN, MD;  Location: [facility] INVASIVE CV LAB;  Service: Cardiovascular;  Laterality: N/A;  RCA   CORONARY STENT INTERVENTION N/A 04/12/2023   Procedure: CORONARY STENT INTERVENTION;  Surgeon: Swaziland, Peter M, MD;  Location: [facility] INVASIVE CV LAB;  Service: Cardiovascular;  Laterality: N/A;   CORONARY STENT INTERVENTION N/A 04/14/2023   Procedure: CORONARY STENT INTERVENTION;  Surgeon: Anner Alm ORN, MD;  Location: [facility] INVASIVE CV LAB;  Service: Cardiovascular;  Laterality: N/A;   ESOPHAGOGASTRODUODENOSCOPY N/A 04/28/2023   Procedure: EGD (ESOPHAGOGASTRODUODENOSCOPY);  Surgeon: Nandigam, Kavitha V, MD;  Location: [facility] ENDOSCOPY;  Service: Gastroenterology;  Laterality: N/A;   FEMORAL-POPLITEAL BYPASS GRAFT Right 03/31/2016   Procedure: BYPASS GRAFT FEMORAL-POPLITEAL ARTERY USING RIGHT GREATER SAPHENOUS NONREVERSED VEIN;  Surgeon: Lonni GORMAN Blade, MD;  Location: [facility] OR;  Service: Vascular;  Laterality: Right;   HERNIA REPAIR     I & D EXTREMITY Right 03/31/2016   Procedure: IRRIGATION AND DEBRIDEMENT FOOT;  Surgeon: Lonni GORMAN Blade, MD;  Location: [facility] OR;  Service: Vascular;  Laterality: Right;   IMPLANTABLE CARDIOVERTER DEFIBRILLATOR IMPLANT N/A 06/27/2013   Procedure: SUB Q ICD;  Surgeon: Elspeth JAYSON Sage, MD;  Location: [facility] CATH LAB;  Service: Cardiovascular;  Laterality: N/A;   INTRAOPERATIVE ARTERIOGRAM Right 03/31/2016   Procedure: INTRA OPERATIVE ARTERIOGRAM;  Surgeon: Lonni GORMAN Blade, MD;  Location: [facility] OR;  Service: Vascular;  Laterality: Right;   IR GENERIC HISTORICAL Right 11/30/2015   IR THROMBECTOMY AV FISTULA  W/THROMBOLYSIS/PTA INC/SHUNT/IMG RIGHT 11/30/2015 Marcey Moan, MD [facility]   IR GENERIC HISTORICAL  11/30/2015   IR US  GUIDE VASC ACCESS RIGHT 11/30/2015 Marcey Moan, MD [facility]   IR GENERIC HISTORICAL Right 12/15/2015   IR THROMBECTOMY AV FISTULA W/THROMBOLYSIS/PTA/STENT INC/SHUNT/IMG RT 12/15/2015 Toribio Faes, MD [facility]   IR GENERIC HISTORICAL  12/15/2015   IR US  GUIDE VASC ACCESS RIGHT 12/15/2015 Toribio Faes, MD [facility]   IR GENERIC HISTORICAL  12/28/2015   IR FLUORO GUIDE CV LINE RIGHT 12/28/2015 Rome Hall, MD [facility]   IR GENERIC HISTORICAL  12/28/2015   IR US  GUIDE VASC ACCESS RIGHT 12/28/2015 Rome Hall, MD [facility]   LEFT A ND RIGHT HEART CATH  01/30/2013   DR CHET   LEFT AND RIGHT HEART CATHETERIZATION WITH CORONARY ANGIOGRAM N/A 01/30/2013   Procedure: LEFT AND RIGHT HEART CATHETERIZATION WITH CORONARY ANGIOGRAM;  Surgeon: Toribio JONELLE Fuel, MD;  Location: [facility] CATH LAB;  Service: Cardiovascular;  Laterality: N/A;   LEFT HEART CATH AND CORONARY ANGIOGRAPHY N/A 04/12/2023   Procedure: LEFT HEART CATH AND CORONARY ANGIOGRAPHY;  Surgeon: Swaziland, Peter M, MD;  Location: [facility] INVASIVE CV LAB;  Service: Cardiovascular;  Laterality: N/A;   PERIPHERAL VASCULAR CATHETERIZATION Right 01/26/2015   Procedure: A/V Fistulagram;  Surgeon: Lonni GORMAN Blade, MD;  Location: [facility] INVASIVE CV LAB;  Service: Cardiovascular;  Laterality: Right;   reapea urethral surgery for recurrent obstruction  2011   TOTAL KNEE ARTHROPLASTY  Right 2007   VEIN HARVEST Right 03/31/2016   Procedure: RIGHT GREATER SAPHENOUS VEIN HARVEST;  Surgeon: Lonni GORMAN Blade, MD;  Location: [facility] OR;  Service: Vascular;  Laterality: Right;     Social History:   reports that he quit smoking about 13 years ago. His smoking use included cigarettes. He started smoking about 45 years ago. He has a 64 pack-year smoking history. He has never used smokeless tobacco. He reports that he does not  drink alcohol  and does not use drugs.   Family History:  His family history includes Alcohol  abuse in his father; Bladder Cancer in his mother; Diabetes in his maternal grandmother; Heart Problems in his maternal grandmother; Heart disease in his maternal grandfather; Melanoma in his father; Prostate cancer in his maternal grandfather; Stroke in his maternal grandmother.   Allergies Allergies  Allergen Reactions   Epoetin  Alfa Other (See Comments)    Unknown    Ferumoxytol  Other (See Comments)    Unknown    Morphine Sulfate Rash and Other (See Comments)    Itches all over, red spots     Home Medications  Prior to Admission medications   Medication Sig Start Date End Date Taking? Authorizing Provider  acetaminophen  (TYLENOL ) 325 MG tablet Take 1-2 tablets (325-650 mg total) by mouth every 4 (four) hours as needed for mild pain. 11/29/19  Yes Love, Sharlet GORMAN, PA-C  amiodarone  (PACERONE ) 200 MG tablet TAKE 1 TABLET ( 200 MG ) BY MOUTH TWICE DAILY FOR 20 DAYS, THEN 1 TABLET ( 200 MG TOTAL ) ONCE DAILY Patient taking differently: Take 200 mg by mouth daily. 09/18/23  Yes Burchette, Wolm ORN, MD  apixaban  (ELIQUIS ) 5 MG TABS tablet Take 1 tablet (5 mg total) by mouth 2 (two) times daily. 05/22/23  Yes Ghimire, Donalda HERO, MD  atorvastatin  (LIPITOR ) 80 MG tablet Take 1 tablet (80 mg total) by mouth at bedtime. 09/20/23  Yes Burchette, Wolm ORN, MD  carvedilol  (COREG ) 6.25 MG tablet Take 1 tablet (6.25 mg total) by mouth 2 (two) times daily. 09/20/23  Yes Burchette, Wolm ORN, MD  cinacalcet  (SENSIPAR ) 30 MG tablet Take 1 tablet (30 mg total) by mouth Every Tuesday,Thursday,and Saturday with dialysis. 11/30/19  Yes Love, Sharlet GORMAN, PA-C  clopidogrel  (PLAVIX ) 75 MG tablet Take 1 tablet (75 mg total) by mouth daily with breakfast. 08/14/23  Yes Burchette, Wolm ORN, MD  gabapentin  (NEURONTIN ) 300 MG capsule Take 1 capsule (300 mg total) by mouth at bedtime. 04/15/23  Yes Hongalgi, Anand D, MD  HUMALOG  KWIKPEN 200  UNIT/ML KwikPen INJECT A MAXIMUM OF 28 UNITS SUBCUTANEOUSLY TWICE DAILY WITH LUNCH AND SUPPER PER SLIDING SCALE. APPOINTMENT REQUIRED FOR FUTURE REFILLS 08/16/23  Yes Burchette, Wolm ORN, MD  lidocaine  (LIDODERM ) 5 % Place 1 patch onto the skin daily as needed (pain). Remove & Discard patch within 12 hours or as directed by MD   Yes [provider]  midodrine  (PROAMATINE ) 10 MG tablet Take 1 tablet (10 mg total) by mouth every 8 (eight) hours. Patient taking differently: Take 10 mg by mouth every 8 (eight) hours. Takes once a day on dialysis days ONLY 09/20/23  Yes Burchette, Wolm ORN, MD  multivitamin (RENA-VIT) TABS tablet Take 1 tablet by mouth once daily 07/06/21  Yes Burchette, Wolm ORN, MD  Olopatadine  HCl 0.2 % SOLN Place 1 drop into both eyes daily as needed (for allergies).    Yes [provider]  VENTOLIN  HFA 108 (90 Base) MCG/ACT inhaler INHALE 1 TO 2 PUFFS  BY MOUTH EVERY 6 HOURS AS NEEDED FOR WHEEZING FOR SHORTNESS OF BREATH 05/05/23  Yes Burchette, Wolm ORN, MD  Continuous Glucose Receiver (FREESTYLE LIBRE 3 READER) DEVI Use to check blood glucose TID 08/11/22   Burchette, Wolm ORN, MD  Continuous Glucose Sensor (FREESTYLE LIBRE 3 PLUS SENSOR) MISC USE AS DIRECTED TO CHECK GLUCOSE DAILY. CHANGE EVERY 15 DAYS 05/24/23   Burchette, Wolm ORN, MD  Elastic Bandages & Supports (ABDOMINAL BINDER/ELASTIC MED) MISC 1 kit by Does not apply route daily. 06/09/23   Miriam Norris, NP  glucose blood test strip Check 1 time daily. E11.9 One Touch Ultra Blue Test Strips 03/10/14   Burchette, Wolm ORN, MD  Insulin  Pen Needle (BD PEN NEEDLE NANO U/F) 32G X 4 MM MISC USE 1 PEN NEEDLE SUBCUTANEOUSLY WITH INSULIN  4 TIMES DAILY 12/04/19   Burchette, Wolm ORN, MD  nitroGLYCERIN  (NITROSTAT ) 0.4 MG SL tablet Place 1 tablet (0.4 mg total) under the tongue every 5 (five) minutes as needed for chest pain. 04/21/23   Burchette, Wolm ORN, MD     Critical care time: 45 minutes     JD Emilio DEVONNA [facility] 10/11/2023, 10:13 AM  Please see [email or web address] for pager details.  From 7A-7P if no response, please call [phone]. After hours, please call ELink [phone].

## 2023-10-11 DEATH — deceased

## 2023-10-12 ENCOUNTER — Inpatient Hospital Stay (HOSPITAL_COMMUNITY)

## 2023-10-12 DIAGNOSIS — R6521 Severe sepsis with septic shock: Secondary | ICD-10-CM

## 2023-10-12 DIAGNOSIS — I480 Paroxysmal atrial fibrillation: Secondary | ICD-10-CM | POA: Diagnosis not present

## 2023-10-12 DIAGNOSIS — J90 Pleural effusion, not elsewhere classified: Secondary | ICD-10-CM | POA: Diagnosis not present

## 2023-10-12 DIAGNOSIS — I469 Cardiac arrest, cause unspecified: Secondary | ICD-10-CM

## 2023-10-12 DIAGNOSIS — J9601 Acute respiratory failure with hypoxia: Secondary | ICD-10-CM | POA: Diagnosis not present

## 2023-10-12 DIAGNOSIS — A419 Sepsis, unspecified organism: Secondary | ICD-10-CM

## 2023-10-12 DIAGNOSIS — R579 Shock, unspecified: Secondary | ICD-10-CM | POA: Diagnosis not present

## 2023-10-12 DIAGNOSIS — I5020 Unspecified systolic (congestive) heart failure: Secondary | ICD-10-CM

## 2023-10-12 LAB — GLUCOSE, CAPILLARY
Glucose-Capillary: 368 mg/dL — ABNORMAL HIGH (ref 70–99)
Glucose-Capillary: 400 mg/dL — ABNORMAL HIGH (ref 70–99)
Glucose-Capillary: 387 mg/dL — ABNORMAL HIGH (ref 70–99)
Glucose-Capillary: 363 mg/dL — ABNORMAL HIGH (ref 70–99)
Glucose-Capillary: 313 mg/dL — ABNORMAL HIGH (ref 70–99)
Glucose-Capillary: 238 mg/dL — ABNORMAL HIGH (ref 70–99)
Glucose-Capillary: 213 mg/dL — ABNORMAL HIGH (ref 70–99)
Glucose-Capillary: 169 mg/dL — ABNORMAL HIGH (ref 70–99)
Glucose-Capillary: 144 mg/dL — ABNORMAL HIGH (ref 70–99)
Glucose-Capillary: 118 mg/dL — ABNORMAL HIGH (ref 70–99)
Glucose-Capillary: 116 mg/dL — ABNORMAL HIGH (ref 70–99)
Glucose-Capillary: 116 mg/dL — ABNORMAL HIGH (ref 70–99)
Glucose-Capillary: 121 mg/dL — ABNORMAL HIGH (ref 70–99)
Glucose-Capillary: 104 mg/dL — ABNORMAL HIGH (ref 70–99)
Glucose-Capillary: 111 mg/dL — ABNORMAL HIGH (ref 70–99)
Glucose-Capillary: 106 mg/dL — ABNORMAL HIGH (ref 70–99)
Glucose-Capillary: 109 mg/dL — ABNORMAL HIGH (ref 70–99)
Glucose-Capillary: 121 mg/dL — ABNORMAL HIGH (ref 70–99)
Glucose-Capillary: 127 mg/dL — ABNORMAL HIGH (ref 70–99)
Glucose-Capillary: 123 mg/dL — ABNORMAL HIGH (ref 70–99)
Glucose-Capillary: 116 mg/dL — ABNORMAL HIGH (ref 70–99)

## 2023-10-12 LAB — HEMOGLOBIN AND HEMATOCRIT, BLOOD
HCT: 34.6 % — ABNORMAL LOW (ref 39.0–52.0)
Hemoglobin: 11.6 g/dL — ABNORMAL LOW (ref 13.0–17.0)

## 2023-10-12 LAB — RENAL FUNCTION PANEL
Albumin: 2.4 g/dL — ABNORMAL LOW (ref 3.5–5.0)
Albumin: 2.2 g/dL — ABNORMAL LOW (ref 3.5–5.0)
Anion gap: 22 — ABNORMAL HIGH (ref 5–15)
Anion gap: 15 (ref 5–15)
BUN: 69 mg/dL — ABNORMAL HIGH (ref 8–23)
BUN: 51 mg/dL — ABNORMAL HIGH (ref 8–23)
CO2: 17 mmol/L — ABNORMAL LOW (ref 22–32)
CO2: 19 mmol/L — ABNORMAL LOW (ref 22–32)
Calcium: 7.1 mg/dL — ABNORMAL LOW (ref 8.9–10.3)
Calcium: 7.2 mg/dL — ABNORMAL LOW (ref 8.9–10.3)
Chloride: 93 mmol/L — ABNORMAL LOW (ref 98–111)
Chloride: 101 mmol/L (ref 98–111)
Creatinine, Ser: 11.17 mg/dL — ABNORMAL HIGH (ref 0.61–1.24)
Creatinine, Ser: 7.59 mg/dL — ABNORMAL HIGH (ref 0.61–1.24)
GFR, Estimated: 5 mL/min — ABNORMAL LOW (ref >60)
GFR, Estimated: 7 mL/min — ABNORMAL LOW (ref >60)
Glucose, Bld: 403 mg/dL — ABNORMAL HIGH (ref 70–99)
Glucose, Bld: 105 mg/dL — ABNORMAL HIGH (ref 70–99)
Phosphorus: 6.9 mg/dL — ABNORMAL HIGH (ref 2.5–4.6)
Phosphorus: 3.7 mg/dL (ref 2.5–4.6)
Potassium: 4.7 mmol/L (ref 3.5–5.1)
Potassium: 4.1 mmol/L (ref 3.5–5.1)
Sodium: 132 mmol/L — ABNORMAL LOW (ref 135–145)
Sodium: 135 mmol/L (ref 135–145)

## 2023-10-12 LAB — CBC
HCT: 35.1 % — ABNORMAL LOW (ref 39.0–52.0)
Hemoglobin: 11.7 g/dL — ABNORMAL LOW (ref 13.0–17.0)
MCH: 28.5 pg (ref 26.0–34.0)
MCHC: 33.3 g/dL (ref 30.0–36.0)
MCV: 85.6 fL (ref 80.0–100.0)
Platelets: 486 K/uL — ABNORMAL HIGH (ref 150–400)
RBC: 4.1 MIL/uL — ABNORMAL LOW (ref 4.22–5.81)
RDW: 15.1 % (ref 11.5–15.5)
WBC: 31.5 K/uL — ABNORMAL HIGH (ref 4.0–10.5)
nRBC: 0 % (ref 0.0–0.2)

## 2023-10-12 LAB — DIC (DISSEMINATED INTRAVASCULAR COAGULATION)PANEL
D-Dimer, Quant: 2.11 ug{FEU}/mL — ABNORMAL HIGH (ref 0.00–0.50)
Fibrinogen: >800 mg/dL — ABNORMAL HIGH (ref 210–475)
INR: 2.7 — ABNORMAL HIGH (ref 0.8–1.2)
Platelets: 411 K/uL — ABNORMAL HIGH (ref 150–400)
Prothrombin Time: 29.9 s — ABNORMAL HIGH (ref 11.4–15.2)
Smear Review: NONE SEEN
aPTT: 177 s (ref 24–36)

## 2023-10-12 LAB — BODY FLUID CULTURE W GRAM STAIN: Culture: NO GROWTH

## 2023-10-12 LAB — LACTIC ACID, PLASMA: Lactic Acid, Venous: 2 mmol/L (ref 0.5–1.9)

## 2023-10-12 LAB — COOXEMETRY PANEL
Carboxyhemoglobin: 2.1 % — ABNORMAL HIGH (ref 0.5–1.5)
Methemoglobin: 1.1 % (ref 0.0–1.5)
O2 Saturation: 72.9 %
Total hemoglobin: 12 g/dL (ref 12.0–16.0)

## 2023-10-12 LAB — TROPONIN I (HIGH SENSITIVITY)
Troponin I (High Sensitivity): 4864 ng/L (ref <18)
Troponin I (High Sensitivity): 5169 ng/L (ref <18)

## 2023-10-12 LAB — TRIGLYCERIDES, BODY FLUIDS: Triglycerides, Fluid: 40 mg/dL

## 2023-10-12 LAB — HEPARIN LEVEL (UNFRACTIONATED): Heparin Unfractionated: >1.1 [IU]/mL — ABNORMAL HIGH (ref 0.30–0.70)

## 2023-10-12 LAB — APTT: aPTT: 187 s (ref 24–36)

## 2023-10-12 MED ORDER — PRISMASOL BGK 4/2.5 32-4-2.5 MEQ/L EC SOLN: Status: DC

## 2023-10-12 MED ORDER — DEXTROSE 50 % IV SOLN: 0.0000 mL | INTRAVENOUS | Status: DC | PRN

## 2023-10-12 MED ORDER — PRISMASOL BGK 4/2.5 32-4-2.5 MEQ/L EC SOLN
Status: DC
Start: 2023-10-12 — End: 2023-10-20

## 2023-10-12 MED ORDER — SODIUM CHLORIDE 0.9% FLUSH
10.0000 mL | Freq: Three times a day (TID) | INTRAVENOUS | Status: DC
Start: 2023-10-12 — End: 2023-10-20
  Administered 2023-10-12 – 2023-10-19 (×19): 10 mL via INTRAPLEURAL

## 2023-10-12 MED ORDER — HEPARIN SODIUM (PORCINE) 1000 UNIT/ML DIALYSIS
1000.0000 [IU] | INTRAMUSCULAR | Status: DC | PRN
  Administered 2023-10-12: 3000 [IU] via INTRAVENOUS_CENTRAL
  Administered 2023-10-13: 2000 [IU] via INTRAVENOUS_CENTRAL
  Filled 2023-10-12: qty 6
  Filled 2023-10-12: qty 2
  Filled 2023-10-12: qty 6
  Filled 2023-10-12: qty 2
  Filled 2023-10-12: qty 6

## 2023-10-12 MED ORDER — INSULIN ASPART 100 UNIT/ML IJ SOLN
12.0000 [IU] | Freq: Once | INTRAMUSCULAR | Status: AC
  Administered 2023-10-12: 12 [IU] via SUBCUTANEOUS

## 2023-10-12 MED ORDER — PIPERACILLIN-TAZOBACTAM 3.375 G IVPB
3.3750 g | Freq: Four times a day (QID) | INTRAVENOUS | Status: DC
  Administered 2023-10-12 – 2023-10-14 (×9): 3.375 g via INTRAVENOUS
  Filled 2023-10-12 (×9): qty 50

## 2023-10-12 MED ORDER — INSULIN REGULAR(HUMAN) IN NACL 100-0.9 UT/100ML-% IV SOLN
INTRAVENOUS | Status: DC
  Administered 2023-10-12: 13 [IU]/h via INTRAVENOUS
  Administered 2023-10-12: 0.9 [IU]/h via INTRAVENOUS
  Filled 2023-10-12 (×2): qty 100

## 2023-10-12 MED ORDER — HEPARIN (PORCINE) 25000 UT/250ML-% IV SOLN
700.0000 [IU]/h | INTRAVENOUS | Status: DC
  Administered 2023-10-12: 700 [IU]/h via INTRAVENOUS

## 2023-10-12 MED ORDER — HEPARIN SODIUM (PORCINE) 1000 UNIT/ML DIALYSIS
1000.0000 [IU] | INTRAMUSCULAR | Status: DC | PRN
  Administered 2023-10-13: 2000 [IU] via INTRAVENOUS_CENTRAL
  Filled 2023-10-12: qty 6

## 2023-10-12 NOTE — Progress Notes (Signed)
 Heart Failure Navigator Progress Note  Assessed for Heart & Vascular TOC clinic readiness.  Patient does not meet criteria due to ESRD on Hemodialysis. No HF TOC. .   Navigator will sign off at this time.   Stephane Haddock, BSN, Scientist, clinical (histocompatibility and immunogenetics) Only

## 2023-10-12 NOTE — Progress Notes (Addendum)
 PHARMACY - ANTICOAGULATION CONSULT NOTE  Pharmacy Consult for heparin  Indication: atrial fibrillation  Allergies  Allergen Reactions   Epoetin  Alfa Other (See Comments)    Unknown    Ferumoxytol  Other (See Comments)    Unknown    Morphine Sulfate Rash and Other (See Comments)    Itches all over, red spots    Patient Measurements: Height: 5' 10 (177.8 cm) Weight: 81.8 kg (180 lb 5.4 oz) IBW/kg (Calculated) : 73 HEPARIN  DW (KG): 82.1  Vital Signs: Temp: 98.7 F (37.1 C) (10/02 0800) Temp Source: Oral (10/02 0800) BP: 136/54 (10/02 0900) Pulse Rate: 61 (10/02 0915)  Labs: Recent Labs    10/11/23 0620 10/11/23 0931 10/11/23 0949 10/11/23 1228 10/11/23 1252 10/11/23 1258 10/11/23 1325 10/11/23 1635 10/11/23 2024 10/12/23 0036 10/12/23 0502 10/12/23 0753  HGB 13.7   < > 12.8*  --   --   --    < >  --  11.7* 11.6* 11.7*  --   HCT 41.9   < > 38.4*  --   --   --    < >  --  34.4* 34.6* 35.1*  --   PLT 474*  --  470*  --   --   --   --   --   --   --  486*  --   APTT  --   --   --  42*  --   --   --   --  >200*  --   --  187*  HEPARINUNFRC  --   --   --   --  >1.10*  --   --   --  >1.10*  --   --  >1.10*  CREATININE 10.18*  --  10.48*  --   --   --   --  10.59*  --   --  11.17*  --   TROPONINIHS  --   --   --   --   --  897*  --  1,127*  --   --   --   --    < > = values in this interval not displayed.    Estimated Creatinine Clearance: 7.1 mL/min (A) (by C-G formula based on SCr of 11.17 mg/dL (H)).  Assessment: Patient is a 63 year old admitted for septic shock requiring multiple pressors and pleural effusion, complicated by respiratory arrest. He has a history of atrial fibrillation on Eliquis  PTA. Last dose Eliquis  10/09/23 AM.  Pharmacy consulted to dose IV heparin .  Heparin  level and aPTT were elevated last night and heparin  was decreased to 950 units/hr. Heparin  level and aPTT are still elevated (lab was drawn from A-line). No bleeding observed. Due to septic  shock on multiple pressors, concern for DIC.   Goal of Therapy:  Heparin  level 0.3-0.7 units/ml aPTT 66-102 seconds Monitor platelets by anticoagulation protocol: Yes   Plan:  Hold IV heparin  for 1.5 hrs (RN aware) At 1130 resume IV heparin  at 700 units/hr Check 8 hr aPTT Follow DIC panel results   Addendum: Per discussion with CCM following DIC panel results, will hold heparin  gtt for now. Will follow repeat coag panels to evaluate restart.   Vermell Mccallum, PharmD 10/12/2023, 10:00 AM

## 2023-10-12 NOTE — Progress Notes (Signed)
 [facility] Progress Note   Subjective: Remains on 3 pressors, IV abx  Objective Vitals:   10/12/23 0826 10/12/23 0827 10/12/23 0830 10/12/23 0845  BP:      Pulse:   60 61  Resp:   (!) 24 (!) 24  Temp:      TempSrc:      SpO2: 100% 100% 100% 100%  Weight:      Height:       Physical Exam Gen on vent, sedated Sclera anicteric, throat w/ ETT No jvd or bruits Chest clear anterior/ lateral RRR no MRG Abd soft ntnd no mass or ascites +bs GU nl male Ext bilat BKA, no edema Neuro is on vent, sedated TDC in R chest   OP HD: TTS [facility] 4h  B400   82.5kg  1K bath  TDC  Hep none - Calcitriol  0.75mcg PO q HD - Cinacalcet  90mg  PO q HD   Assessment/Plan: R loc pleural effusion/ RLL consolidation vs atx: w/ extensive R pleural nodularity and possibly pathologic rib fx's. Prob PNA and possibly malignancy. SP 1.8 L thora on 9/26, then 500 cc on 9/28.  Septic shock: remains on 3 pressors today Cardiac arrest: on 10/01, ROSC approx 10 min.  ESRD: HD on TTS. Had 2 hrs HD Tuesday. Will need CRRT for now due to severe shock.  Anemia of ESRD: Hgb 13. ESA would be contraindicated at this time in setting of presumed cancer Dx. Volume: no vol excess on exam or CXR, under dry wt. Keep even.   Secondary HPTH: CorrCa/Phos ok - not getting binders here - follow for now. Nutrition: Alb low, continue supplements. A-fib T2DM   Myer Fret  MD  [facility] 10/12/2023, 9:02 AM  Recent Labs  Lab 10/11/23 1635 10/11/23 2024 10/12/23 0036 10/12/23 0502  HGB  --    < > 11.6* 11.7*  ALBUMIN  2.8*  --   --  2.4*  CALCIUM  7.4*  --   --  7.1*  PHOS 6.7*  --   --  6.9*  CREATININE 10.59*  --   --  11.17*  K 4.5  --   --  4.7   < > = values in this interval not displayed.    Inpatient medications:  arformoterol  15 mcg Nebulization Q12H   atorvastatin   80 mg Per Tube QHS   budesonide (PULMICORT) nebulizer solution  0.5 mg Nebulization BID   Chlorhexidine  Gluconate Cloth  6 each  Topical Q0600   clopidogrel   75 mg Per Tube Q breakfast   docusate  100 mg Per Tube BID   famotidine   20 mg Per Tube Daily   feeding supplement (NEPRO CARB STEADY)  237 mL Per Tube BID BM   hydrocortisone  sod succinate (SOLU-CORTEF ) inj  100 mg Intravenous Q12H   midodrine   10 mg Per Tube Q8H   multivitamin  1 tablet Per Tube Daily   mouth rinse  15 mL Mouth Rinse Q2H   polyethylene glycol  17 g Per Tube Daily   revefenacin  175 mcg Nebulization Daily   sodium chloride  flush  10 mL Intrapleural Q8H   sodium chloride  flush  3 mL Intravenous Q12H    doxycycline  (VIBRAMYCIN ) IV Stopped (10/12/23 0257)   epinephrine  11 mcg/min (10/12/23 0800)   fentaNYL  infusion INTRAVENOUS 300 mcg/hr (10/12/23 0800)   heparin  950 Units/hr (10/12/23 0800)   insulin  7.5 Units/hr (10/12/23 0804)   norepinephrine  (LEVOPHED ) Adult infusion 28 mcg/min (10/12/23 0800)   piperacillin -tazobactam (ZOSYN )  IV Stopped (  10/12/23 0715)   vasopressin  0.04 Units/min (10/12/23 0800)   acetaminophen  **OR** acetaminophen , dextrose , fentaNYL , heparin , ipratropium-albuterol , ondansetron  (ZOFRAN ) IV, mouth rinse

## 2023-10-12 NOTE — Inpatient Diabetes Management (Signed)
 Inpatient Diabetes Program Recommendations  AACE/ADA: New Consensus Statement on Inpatient Glycemic Control (2015)  Target Ranges:  Prepandial:   less than 140 mg/dL      Peak postprandial:   less than 180 mg/dL (1-2 hours)      Critically ill patients:  140 - 180 mg/dL    Latest Reference Range & Units 10/12/23 04:02 10/12/23 04:39 10/12/23 05:48 10/12/23 06:49 10/12/23 08:02 10/12/23 09:08 10/12/23 10:08 10/12/23 11:00 10/12/23 12:07  Glucose-Capillary 70 - 99 mg/dL 599 (H)  IV Insulin  Drip Started 387 (H) 363 (H) 313 (H) 238 (H) 213 (H) 169 (H) 144 (H) 118 (H)  (H): Data is abnormally high     Home DM Meds: Humalog  Maximum 28 units BID (lunch and supper) per SSI       Freestyle Libre 3 CGM  Current Orders: IV Insulin  Drip     MD- Note pt started on IV Insulin  drip early this AM due to severe Hyperglycemia  Note CBGs are now more stable  Recommend leave on IV Insulin  Drip for now due to ongoing complicated medical issues and IV Steroids  If you leave on the IV Insulin  Drip, may need to add some Dextrose  to IVF  Could be complicated to transition to SQ Insulin  yet, due to Lantus  on board (16 units) from last PM    --Will follow patient during hospitalization--  Adina Rudolpho Arrow RN, MSN, CDCES Diabetes Coordinator Inpatient Glycemic Control Team Team Pager: [phone] (8a-5p)

## 2023-10-12 NOTE — Progress Notes (Signed)
 eLink Physician-Brief Progress Note Patient Name: Anthony Bullock DOB: [birth year] MRN: [id number]   Date of Service  10/12/2023  HPI/Events of Note  Glucose remains around 400.  eICU Interventions  Start insulin  drip     Intervention Category Intermediate Interventions: Hyperglycemia - evaluation and treatment  CLAUDENE AGENT, P 10/12/2023, 4:16 AM

## 2023-10-12 NOTE — Progress Notes (Addendum)
 Initial Nutrition Assessment  DOCUMENTATION CODES:   Not applicable  INTERVENTION:  Recommend holding off on initiating tube feeds until pt requires less pressor support and insulin  drip is weaning  When ready to begin enteral, recommend:  Initiate tube feeding via OG: recommend beginning at 25 ml/h and increase 10 ml every 8 h until goal rate is reached Osmolite 1.5 at 55 ml/h (1320 ml per day) Prosource TF20 60 ml BID Provides 2160 kcal, 122 gm protein, 1005 ml free water daily  Pt may be at refeeding risk given inadequate nutrition for 4 days  Monitor electrolyte labs Add thiamine 100 mg prior to EN initiation  Continue RenaVit daily  NUTRITION DIAGNOSIS:   Inadequate oral intake related to inability to eat as evidenced by NPO status.  GOAL:   Patient will meet greater than or equal to 90% of their needs  MONITOR:   TF tolerance, Vent status, Labs  REASON FOR ASSESSMENT:   Consult Enteral/tube feeding initiation and management, Assessment of nutrition requirement/status  ASSESSMENT:   Pt with hx of  COPD, ESRD on HD, heart failure, PAF, CAD, diabetes, anemia, and PVD s/p bilateral BKA. Admitted with R pleural effusion and SOB.  9/26 admitted; thoracentesis -1.85L 9/28 thoracentesis -520 mL  10/1 pt became hypotensive requiring levo and transferred to ICU, intubated  Pt remains intubated and requiring 3 pressers. MD consulted to initiate enteral nutrition. Discussed concerns about pressor requirement and pt being on insulin  drip, MD agrees to hold on tube feeds until pressor support can be decreased. RN discussed that insulin  drip will likely be weaned sometime today. Pt continues to require CRRT for severe shock.   No family at bedside during assessment. Unable to obtain diet/wt hx at this time, but RN reports wife stated pt was doing well recently and had just recently mowed his lawn. Pt uses prosthetics bilaterally for mobility. Nutrition focused physical exam  shows relatively adequate fat stores but mild muscle depletions. Cannot diagnose malnutrition at this time. Previously, pt's wife had reported 20# wt loss, but unsure if this wt loss was related to fluid shifts as dialysis clinic recorded pt's EDW to be 82.5 kg. Would still recommend titrating enteral feeds slowly once initiated as pt has been without nutrition acutely while admitted.    Patient is currently intubated on ventilator support MV: 13.4 L/min Temp (24hrs), Avg:98.4 F (36.9 C), Min:98 F (36.7 C), Max:99 F (37.2 C) MAP (a-line): 61-69 mmHg   HD Treatment: TTS - [facility] 4hour, 400/500, EDW 82.5kg, 1K/2.5Ca bath, TDC, no heparin  - cinacalcet  90mg  PO q HD - calcitriol  0.75mcg PO q HD  Admit weight: 82.1 kg  Current weight: 81.8 kg   Intake/Output Summary (Last 24 hours) at 10/12/2023 1356 Last data filed at 10/12/2023 1300 Gross per 24 hour  Intake 3601.08 ml  Output 742 ml  Net 2859.08 ml   Net IO Since Admission: 4,196.12 mL [10/12/23 1356]  Drains/Lines: HD Cath R internal jugular A-Line R femoral CVC Triple Lumen L internal jugular OG gastric per xray   Nutritionally Relevant Medications: Scheduled Meds:  docusate  100 mg Per Tube BID   famotidine   20 mg Per Tube Daily   hydrocortisone  sod succinate (SOLU-CORTEF ) inj  100 mg Intravenous Q12H   midodrine   10 mg Per Tube Q8H   multivitamin  1 tablet Per Tube Daily   polyethylene glycol  17 g Per Tube Daily   Continuous Infusions:  doxycycline  (VIBRAMYCIN ) IV Stopped (10/12/23 1119)   epinephrine  Stopped (  10/12/23 1058)   fentaNYL  infusion INTRAVENOUS 275 mcg/hr (10/12/23 1200)   insulin  0.7 Units/hr (10/12/23 1210)   norepinephrine  (LEVOPHED ) Adult infusion 27 mcg/min (10/12/23 1200)   piperacillin -tazobactam (ZOSYN )  IV 3.375 g (10/12/23 1203)   prismasol BGK 4/2.5 1,400 mL/hr at 10/12/23 1003   prismasol BGK 4/2.5 400 mL/hr at 10/12/23 0959   prismasol BGK 4/2.5 400 mL/hr at 10/12/23 1001    vasopressin  0.04 Units/min (10/12/23 1200)    Labs Reviewed: Sodium 132/ Chloride 93 BUN 69/ Cr 11.17 Phos 6.9 Potassium 4.7 Mag 3.5 CBG ranges from 118-400 mg/dL over the last 24 hours HgbA1c 6.7  NUTRITION - FOCUSED PHYSICAL EXAM:  Flowsheet Row Most Recent Value  Orbital Region Unable to assess  Upper Arm Region Mild depletion  Thoracic and Lumbar Region No depletion  Buccal Region Unable to assess  Temple Region No depletion  Clavicle Bone Region No depletion  Clavicle and Acromion Bone Region No depletion  Scapular Bone Region Mild depletion  Dorsal Hand Unable to assess  Patellar Region Mild depletion  Anterior Thigh Region Mild depletion  Posterior Calf Region Unable to assess  [Bilteral BKA]  Edema (RD Assessment) None  Hair Reviewed  Eyes Unable to assess  Mouth Unable to assess  Skin Reviewed  Nails Unable to assess    Diet Order:   Diet Order             Diet NPO time specified  Diet effective now                   EDUCATION NEEDS:   Not appropriate for education at this time  Skin:  Skin Assessment: Skin Integrity Issues: Skin Integrity Issues:: Stage I Stage I: buttocks  Last BM:  9/30 type 6  Height:   Ht Readings from Last 1 Encounters:  10/06/23 5' 10 (1.778 m)    Weight:   Wt Readings from Last 1 Encounters:  10/10/23 81.8 kg    BMI:  Body mass index is 25.88 kg/m.  Estimated Nutritional Needs:   Kcal:  2000-2200  Protein:  120-140g  Fluid:  UOP + 1L    Josette Glance, MS, RDN, LDN Clinical Dietitian I Please reach out via secure chat

## 2023-10-12 NOTE — Progress Notes (Signed)
 eLink Physician-Brief Progress Note Patient Name: Anthony Bullock DOB: 08-22-1960 MRN: [id number]   Date of Service  10/12/2023  HPI/Events of Note  Blood glucose 410.  Outside of SSI range. Received 16 lantus  at bedtime.  eICU Interventions  12 units novolog  ordered If remains elevated will consider need for drip     Intervention Category Intermediate Interventions: Hyperglycemia - evaluation and treatment  CLAUDENE AGENT, P 10/12/2023, 12:06 AM

## 2023-10-12 NOTE — Consult Note (Addendum)
 Cardiology Consultation   Patient ID: Anthony Bullock MRN: [id number]; DOB: 04-19-1960  Admit date: 10/06/2023 Date of Consult: 10/12/2023  PCP:  Anthony Wolm LELON, MD   [facility] Providers Cardiologist:  Anthony Carrier, MD  Electrophysiologist:  Anthony Sage, MD       Patient Profile: Anthony Bullock is a 63 y.o. male with a hx of NSTEMI with DES to LAD/RCA, HFrEF, chronic LBBB, ICD in 2015 for primary prevention; left in place after battery ran out, paroxysmal atrial fibrillation, hyperlipidemia, hypertension, PVD status post bilateral BKA 2018, ESRD on HD, & hx of GI bleed who is being seen 10/12/2023 for the evaluation of post cardiac arrest at the request of Anthony Stakes MD.  History of Present Illness: Anthony Bullock follows with Dr. Alvan and Dr. Sage. He has a long history of HFrEF due to nonischemic cardiomyopathy with EF as low as 25-30% in 2014 subsequently leading to ICD placement for primary prevention. His EF then normalized resulting in his ICD generator not being replaced once the battery died. His GDMT has however been slowly titrated down 2/2 hypotension with dialysis requiring the use of midodrine . In 2024 developed AF during dialysis. He has had several reoccurrences during subsequent hospitalizations.   In the last 2 years, patient has had multiple HF exacerbation hospitalizations with acute respiratory failure. In 04/2023 patient presented with NSTEMI. He underwent LHC that showed severe 3 vessel CAD. He had a staged PCI woth DES to bifurcation of LAD/ first diagonal and DES to RCA. It was recommended for patient to continue DAPT for one year in conjunction with eliquis  given the extensive CAD seen on LHC. Unfortunately patient was readmitted that month as he had then developed an UGI bleed resulting in acute blood loss anemia. Course c/b c.diff and AF and he was initiated on amiodarone . His ASA and eliquis  were held at discharge. Two weeks later patient was  re-admitted due to acute hypoxic respiratory failure 2/2 volume overload.  Patient followed up with cardiology and was re-started on eliquis .   Patient presented to the ED on 9/26 for shortness of breath that had been ongoing for about a month. Also reported right sided chest pain that radiated to back. Had been compliant with plavix  and eliquis .  In the ED: BP: 140/45   HR 76  RR 22 SpO2 94% on 4L of [place] ECG: Sinus Rhythm with LBBB VR 67 CXR showed large right pleural effusion Admission Lab work:  Unremarkable CBC   CMP: K 3.4   Cr 8.08  Albumin  2.7 BNP 526   Troponin 149 -> 155  Patient was admitted with acute respiratory failure 2/2 pleural effusion. Chest pain was thought to be due to pleuritis. Patient underwent thoracentesis on 9/26 and 9/28 with improvement with SOB. Effusion was thought to be exudative, and during US  it appeared that there was an underlying lung mass. He then underwent a CT Chest showing further evidence of possible lung malignancy and possible pathologic fracture/destruction of several ribs. Pulmonology was consulted for further evaluation.  Echo on 9/28 showed LVEF 30-35% with global hypokinesis and G1 DD. No valvular disease.  Eliquis  was held for possible pigtail placement, patient placed on heparin  gtt. CT surgery consulted for possible pleuroscopy and pleural biopsy  On 9/30 rapid response called for patient for decreased LOC and hypotension. He was given midodrine  and 250cc bolus of NS. Patient's condition improved.  Lab work and imaging this day was concerning for possible PNA, he was started on  IV antibiotics.   On 10/1 another rapid response called for patient for hypotension [systolic 70s], hypoxia, and lethargy. His supplemental oxygen  was increased and he was given midodrine . He was then placed on levo and transferred to the ICU. Patient refused HD early that am. Unfortunately later that morning he became hypoxic and tachypneic. PCCM came bedside and during  chest tube placement patient went into PEA arrest/ pulseless VT and had one episode of v-fib requiring shock. Patient was intubated, CPR was performed for 10 minutes, and ROSC was achieved. Patient required IV epi, levo, and vasopressin  in addition to midodrine  for BP support. CTA chest showed no evidence of PE, though worsened right pleural effusion now with loculated components and consolidative/collapse to all 3 RT lobes. After arrest there was concern for anisocoria, CT head obtained which showed no acute intracranial process. Additionally that day APTT was found to be critically high, and IV heparin  was briefly held though then restarted.  His glucose was then consistently elevated 2/2 acute presentation and steroids, and ultimately an IV insulin  gtt was started.  Post arrest ECG sinus rhythm with 1st degree AV block, LBBB VR 77 Post arrest echo showed relatively similar function to echo on 9/28. It is felt his EF reduction may be due to profound systolic dyssynchrony.  Troponin after arrest: 897 -> 1127 -> 4864 -> 5169  Patient is currently not stable enough for pleural biopsy, currently awaiting IR about possible perc bx He has now been transitioned to CRRT Currently receiving midodrine  TID, and IV levo and vasopressin  for BP support.  On IV insulin  for hyperglycemia and IV antibiotics for PNA. IV heparin  has been stopped 2/2 DIC. Patient is still on ventilator though appears to be weaning off. Currently FiO2 40%  During interview patient was asleep and intubated.    Past Medical History:  Diagnosis Date   AICD (automatic cardioverter/defibrillator) present    [employer]   Allergic rhinitis    Anemia    Arthritis    Chronic systolic heart failure (HCC)    a. ECHO (12/2012) EF 25-30%, HK entireanteroseptal myocardium //  b.  EF 25%, diffuse HK, grade 1 diastolic dysfunction, MAC, mild LAE, normal RVSF, trivial pericardial effusion   COPD (chronic obstructive pulmonary disease)  (HCC)    Diabetes mellitus type II    Diabetic nephropathy (HCC)    Diabetic neuropathy (HCC)    ESRD on hemodialysis (HCC)    started HD June 2017, goes to [facility] HD unit, Dr Edwardo   History of cardiac catheterization    a.Myoview  1/15:  There is significant left ventricular dysfunction. There may be slight scar at the apex. There is no significant ischemia. LV Ejection Fraction: 27%  //  b. RHC/LHC (1/15) with mean RA 6, PA 47/22 mean 33, mean PCWP 20, PVR 2.5 WU, CI 2.5; 80% dLAD stenosis, 70% diffuse large D.     History of kidney stones    Hyperlipidemia    Hypertension    Kidney stones    NICM (nonischemic cardiomyopathy) (HCC)    Primarily nonischemic.  Echo (12/14) with EF 25-30%.  Echo (3/15) with EF 25%, mild to moderately dilated LV, normal RV size and systolic function.     Osteomyelitis (HCC)    left fifth ray   Pneumonia    Urethral stricture    Wears glasses     Past Surgical History:  Procedure Laterality Date   ABDOMINAL AORTOGRAM W/LOWER EXTREMITY N/A 03/30/2016   Procedure: Abdominal Aortogram w/Lower  Extremity;  Surgeon: Lonni GORMAN Blade, MD;  Location: [facility] INVASIVE CV LAB;  Service: Cardiovascular;  Laterality: N/A;   AMPUTATION Right 04/26/2016   Procedure: Right Below Knee Amputation;  Surgeon: Jerona Harden GAILS, MD;  Location: [facility] OR;  Service: Orthopedics;  Laterality: Right;   AMPUTATION Left 08/21/2019   Procedure: LEFT FOOT 5TH RAY AMPUTATION;  Surgeon: Harden Jerona GAILS, MD;  Location: [facility] OR;  Service: Orthopedics;  Laterality: Left;   AMPUTATION Left 11/13/2019   Procedure: LEFT BELOW KNEE AMPUTATION;  Surgeon: Harden Jerona GAILS, MD;  Location: [facility] OR;  Service: Orthopedics;  Laterality: Left;   AV FISTULA PLACEMENT Right 09/08/2015   Procedure: INSERTION OF 4-46mm x 45cm  ARTERIOVENOUS (AV) GORE-TEX GRAFT RIGHT UPPER  ARM;  Surgeon: Lonni GORMAN Blade, MD;  Location: [facility] OR;  Service: Vascular;  Laterality: Right;   AV FISTULA PLACEMENT Left 01/14/2016    Procedure: CREATION OF LEFT UPPER ARM ARTERIOVENOUS FISTULA;  Surgeon: Lonni GORMAN Blade, MD;  Location: [facility] OR;  Service: Vascular;  Laterality: Left;   BASCILIC VEIN TRANSPOSITION Right 08/22/2014   Procedure: RIGHT UPPER ARM BASCILIC VEIN TRANSPOSITION;  Surgeon: Lonni GORMAN Blade, MD;  Location: [facility] OR;  Service: Vascular;  Laterality: Right;   BELOW KNEE LEG AMPUTATION Right 04/26/2016   BIOPSY OF SKIN SUBCUTANEOUS TISSUE AND/OR MUCOUS MEMBRANE  04/28/2023   Procedure: BIOPSY, SKIN, SUBCUTANEOUS TISSUE, OR MUCOUS MEMBRANE;  Surgeon: Shila Gustav GAILS, MD;  Location: [facility] ENDOSCOPY;  Service: Gastroenterology;;   CARDIAC CATHETERIZATION     CARDIAC DEFIBRILLATOR PLACEMENT  06/27/2013   Sub Q       BY DR FERNANDE   CATARACT EXTRACTION W/PHACO Right 08/06/2018   Procedure: CATARACT EXTRACTION PHACO AND INTRAOCULAR LENS PLACEMENT (IOC);  Surgeon: Harrie Agent, MD;  Location: [facility] ORS;  Service: Ophthalmology;  Laterality: Right;  CDE: 4.06   CATARACT EXTRACTION W/PHACO Left 08/20/2018   Procedure: CATARACT EXTRACTION PHACO AND INTRAOCULAR LENS PLACEMENT (IOC);  Surgeon: Harrie Agent, MD;  Location: [facility] ORS;  Service: Ophthalmology;  Laterality: Left;  CDE: 6.76   COLONOSCOPY WITH PROPOFOL  N/A 07/22/2015   Procedure: COLONOSCOPY WITH PROPOFOL ;  Surgeon: Victory LITTIE Legrand DOUGLAS, MD;  Location: [facility] ENDOSCOPY;  Service: Gastroenterology;  Laterality: N/A;   CORONARY LITHOTRIPSY N/A 04/14/2023   Procedure: CORONARY LITHOTRIPSY;  Surgeon: Anner Alm ORN, MD;  Location: [facility] INVASIVE CV LAB;  Service: Cardiovascular;  Laterality: N/A;  RCA   CORONARY STENT INTERVENTION N/A 04/12/2023   Procedure: CORONARY STENT INTERVENTION;  Surgeon: Swaziland, Peter M, MD;  Location: [facility] INVASIVE CV LAB;  Service: Cardiovascular;  Laterality: N/A;   CORONARY STENT INTERVENTION N/A 04/14/2023   Procedure: CORONARY STENT INTERVENTION;  Surgeon: Anner Alm ORN, MD;  Location: [facility] INVASIVE CV LAB;  Service: Cardiovascular;  Laterality:  N/A;   ESOPHAGOGASTRODUODENOSCOPY N/A 04/28/2023   Procedure: EGD (ESOPHAGOGASTRODUODENOSCOPY);  Surgeon: Nandigam, Kavitha V, MD;  Location: [facility] ENDOSCOPY;  Service: Gastroenterology;  Laterality: N/A;   FEMORAL-POPLITEAL BYPASS GRAFT Right 03/31/2016   Procedure: BYPASS GRAFT FEMORAL-POPLITEAL ARTERY USING RIGHT GREATER SAPHENOUS NONREVERSED VEIN;  Surgeon: Lonni GORMAN Blade, MD;  Location: [facility] OR;  Service: Vascular;  Laterality: Right;   HERNIA REPAIR     I & D EXTREMITY Right 03/31/2016   Procedure: IRRIGATION AND DEBRIDEMENT FOOT;  Surgeon: Lonni GORMAN Blade, MD;  Location: [facility] OR;  Service: Vascular;  Laterality: Right;   IMPLANTABLE CARDIOVERTER DEFIBRILLATOR IMPLANT N/A 06/27/2013   Procedure: SUB Q ICD;  Surgeon: Anthony JAYSON FERNANDE, MD;  Location: [facility] CATH LAB;  Service: Cardiovascular;  Laterality: N/A;   INTRAOPERATIVE ARTERIOGRAM Right 03/31/2016   Procedure: INTRA OPERATIVE ARTERIOGRAM;  Surgeon: Lonni GORMAN Blade, MD;  Location: [facility] OR;  Service: Vascular;  Laterality: Right;   IR GENERIC HISTORICAL Right 11/30/2015   IR THROMBECTOMY AV FISTULA W/THROMBOLYSIS/PTA INC/SHUNT/IMG RIGHT 11/30/2015 Marcey Moan, MD [facility]   IR GENERIC HISTORICAL  11/30/2015   IR US  GUIDE VASC ACCESS RIGHT 11/30/2015 Marcey Moan, MD [facility]   IR GENERIC HISTORICAL Right 12/15/2015   IR THROMBECTOMY AV FISTULA W/THROMBOLYSIS/PTA/STENT INC/SHUNT/IMG RT 12/15/2015 Toribio Faes, MD [facility]   IR GENERIC HISTORICAL  12/15/2015   IR US  GUIDE VASC ACCESS RIGHT 12/15/2015 Toribio Faes, MD [facility]   IR GENERIC HISTORICAL  12/28/2015   IR FLUORO GUIDE CV LINE RIGHT 12/28/2015 Rome Hall, MD [facility]   IR GENERIC HISTORICAL  12/28/2015   IR US  GUIDE VASC ACCESS RIGHT 12/28/2015 Rome Hall, MD [facility]   LEFT A ND RIGHT HEART CATH  01/30/2013   DR CHET   LEFT AND RIGHT HEART CATHETERIZATION WITH CORONARY ANGIOGRAM N/A 01/30/2013   Procedure: LEFT AND RIGHT HEART  CATHETERIZATION WITH CORONARY ANGIOGRAM;  Surgeon: Toribio JONELLE Fuel, MD;  Location: [facility] CATH LAB;  Service: Cardiovascular;  Laterality: N/A;   LEFT HEART CATH AND CORONARY ANGIOGRAPHY N/A 04/12/2023   Procedure: LEFT HEART CATH AND CORONARY ANGIOGRAPHY;  Surgeon: Swaziland, Peter M, MD;  Location: [facility] INVASIVE CV LAB;  Service: Cardiovascular;  Laterality: N/A;   PERIPHERAL VASCULAR CATHETERIZATION Right 01/26/2015   Procedure: A/V Fistulagram;  Surgeon: Lonni GORMAN Blade, MD;  Location: [facility] INVASIVE CV LAB;  Service: Cardiovascular;  Laterality: Right;   reapea urethral surgery for recurrent obstruction  2011   TOTAL KNEE ARTHROPLASTY Right 2007   VEIN HARVEST Right 03/31/2016   Procedure: RIGHT GREATER SAPHENOUS VEIN HARVEST;  Surgeon: Lonni GORMAN Blade, MD;  Location: [facility] OR;  Service: Vascular;  Laterality: Right;       Scheduled Meds:  arformoterol  15 mcg Nebulization Q12H   atorvastatin   80 mg Per Tube QHS   budesonide (PULMICORT) nebulizer solution  0.5 mg Nebulization BID   Chlorhexidine  Gluconate Cloth  6 each Topical Q0600   clopidogrel   75 mg Per Tube Q breakfast   docusate  100 mg Per Tube BID   famotidine   20 mg Per Tube Daily   feeding supplement (NEPRO CARB STEADY)  237 mL Per Tube BID BM   hydrocortisone  sod succinate (SOLU-CORTEF ) inj  100 mg Intravenous Q12H   midodrine   10 mg Per Tube Q8H   multivitamin  1 tablet Per Tube Daily   mouth rinse  15 mL Mouth Rinse Q2H   polyethylene glycol  17 g Per Tube Daily   revefenacin  175 mcg Nebulization Daily   sodium chloride  flush  10 mL Intrapleural Q8H   sodium chloride  flush  3 mL Intravenous Q12H   Continuous Infusions:  doxycycline  (VIBRAMYCIN ) IV Stopped (10/12/23 1119)   epinephrine  Stopped (10/12/23 1058)   fentaNYL  infusion INTRAVENOUS 275 mcg/hr (10/12/23 1200)   insulin  0.7 Units/hr (10/12/23 1210)   norepinephrine  (LEVOPHED ) Adult infusion 27 mcg/min (10/12/23 1200)   piperacillin -tazobactam (ZOSYN )  IV  3.375 g (10/12/23 1203)   prismasol BGK 4/2.5 1,400 mL/hr at 10/12/23 1003   prismasol BGK 4/2.5 400 mL/hr at 10/12/23 0959   prismasol BGK 4/2.5 400 mL/hr at 10/12/23 1001   vasopressin  0.04 Units/min (10/12/23 1200)   PRN Meds: acetaminophen  **OR** acetaminophen , dextrose , fentaNYL , heparin , heparin , ipratropium-albuterol , ondansetron  (ZOFRAN )  IV, mouth rinse  Allergies:    Allergies  Allergen Reactions   Epoetin  Alfa Other (See Comments)    Unknown    Ferumoxytol  Other (See Comments)    Unknown    Morphine Sulfate Rash and Other (See Comments)    Itches all over, red spots    Social History:   Social History   Socioeconomic History   Marital status: Married    Spouse name: Not on file   Number of children: 0   Years of education: Not on file   Highest education level: Not on file  Occupational History   Not on file  Tobacco Use   Smoking status: Former    Current packs/day: 0.00    Average packs/day: 2.0 packs/day for 32.0 years (64.0 ttl pk-yrs)    Types: Cigarettes    Start date: 05/11/1978    Quit date: 05/11/2010    Years since quitting: 13.4   Smokeless tobacco: Never  Vaping Use   Vaping status: Never Used  Substance and Sexual Activity   Alcohol  use: No   Drug use: No   Sexual activity: Yes  Other Topics Concern   Not on file  Social History Narrative   Works at [employer] as a Therapist, music      Right handed   Wears glasses    Drinks 10 oz coffee daily   Drinks 2 sodas per day      Pt disabled    Lives with wife    Social Drivers of Corporate investment banker Strain: Low Risk  (02/03/2023)   Overall Financial Resource Strain (CARDIA)    Difficulty of Paying Living Expenses: Not hard at all  Food Insecurity: No Food Insecurity (10/11/2023)   Hunger Vital Sign    Worried About Running Out of Food in the Last Year: Never true    Ran Out of Food in the Last Year: Never true  Transportation Needs: No Transportation Needs (10/11/2023)   PRAPARE -  Administrator, Civil Service (Medical): No    Lack of Transportation (Non-Medical): No  Physical Activity: Inactive (02/03/2023)   Exercise Vital Sign    Days of Exercise per Week: 0 days    Minutes of Exercise per Session: 0 min  Stress: No Stress Concern Present (02/03/2023)   [employer] of Occupational Health - Occupational Stress Questionnaire    Feeling of Stress : Not at all  Social Connections: Socially Integrated (10/11/2023)   Social Connection and Isolation Panel    Frequency of Communication with Friends and Family: More than three times a week    Frequency of Social Gatherings with Friends and Family: More than three times a week    Attends Religious Services: More than 4 times per year    Active Member of [employer] or Organizations: Yes    Attends Banker Meetings: 1 to 4 times per year    Marital Status: Married  Catering manager Violence: Not At Risk (10/11/2023)   Humiliation, Afraid, Rape, and Kick questionnaire    Fear of Current or Ex-Partner: No    Emotionally Abused: No    Physically Abused: No    Sexually Abused: No    Family History:   Family History  Problem Relation Age of Onset   Bladder Cancer Mother    Alcohol  abuse Father    Melanoma Father    Stroke Maternal Grandmother    Heart Problems Maternal Grandmother        unknown  Diabetes Maternal Grandmother    Heart disease Maternal Grandfather    Prostate cancer Maternal Grandfather      ROS:  Please see the history of present illness.  All other ROS reviewed and negative.     Physical Exam/Data: Vitals:   10/12/23 1130 10/12/23 1145 10/12/23 1148 10/12/23 1200  BP:   (!) 129/42   Pulse: (!) 58 (!) 58 (!) 59 (!) 58  Resp: (!) 24 (!) 24 (!) 24 (!) 24  Temp:    99 F (37.2 C)  TempSrc:    Axillary  SpO2: 100% 100% 100% 100%  Weight:      Height:        Intake/Output Summary (Last 24 hours) at 10/12/2023 1306 Last data filed at 10/12/2023 1200 Gross per 24 hour   Intake 3487.86 ml  Output 1971 ml  Net 1516.86 ml      10/10/2023    7:15 PM 10/10/2023    5:34 PM 10/07/2023   12:30 PM  Last 3 Weights  Weight (lbs) 180 lb 5.4 oz 180 lb 8.9 oz 186 lb 1.1 oz  Weight (kg) 81.8 kg 81.9 kg 84.4 kg     Body mass index is 25.88 kg/m.  General:  Intubated older gentlemen in no acute distress HEENT: normal Neck: no JVD Cardiac:  normal S1, S2; RRR; no murmur  Lungs:  Left side CTA, right side diminished at base Abd: soft, nontender, no hepatomegaly  Ext: no edema Musculoskeletal:  No deformities, BUE and BLE strength normal and equal Skin: warm and dry  Neuro:  CNs 2-12 intact, no focal abnormalities noted Psych:  Normal affect   EKG:  The EKG was personally reviewed and demonstrates:  see hpi Telemetry:  Telemetry was personally reviewed and demonstrates:  sinus with 1st av block, bradycardia HR decline now ~50  Relevant CV Studies: LHC 04/14/23   LESION #1: Prox RCA to Mid RCA lesion is 80% stenosed.   Scoring balloon angioplasty was performed using a BALLN SCOREFLEX 3.50X15. Followed by shockwave lithotripsy with a 3.5 mm 12 mm shockwave balloon   A drug-eluting stent was successfully placed using a SYNERGY XD 3.50X28 => postdilated in the majority of stent to 3.8-3.9 mm. Post intervention, there is a 5% residual stenosis in the tightest segment, but the remainder of the stent is 0% stenosed.  TIMI-3 flow maintained   --------------------------------------------   LESION #2 Ost RCA lesion is 80% stenosed.   Scoring balloon angioplasty was performed using a BALLN SCOREFLEX 3.50X15.  Followed by shockwave lithotripsy with a 3.5 mm 12 mm shockwave balloon   A drug-eluting stent was successfully placed using a SYNERGY XD 3.50X16 postdilated from 4.2 to 4.0 mm.  Post intervention, there is a 0% residual stenosis.  TIMI-3 flow maintained   Diagnostic  Dominance: Right                                                     Successful 2 site PCI of the RCA  using score flex angioplasty followed by shockwave lithotripsy for lesion modification: Mid RCA 80% reduced to 0% with exception of focal 5% (Synergy XD 3.5 mm x 28 mm postdilated to 3.8 mm); ostial RCA 80% reduced to 0% (Synergy XD 3.5 mm 16mm postdilated in tapered fashion from 4.2 to 3.9 mm)   Echocardiogram 04/11/23 IMPRESSIONS  1. Left ventricular ejection fraction, by estimation, is 40%. The left  ventricle has mildly decreased function. The left ventricle demonstrates  global hypokinesis. There is moderate left ventricular hypertrophy. Left  ventricular diastolic parameters are   consistent with Grade I diastolic dysfunction (impaired relaxation).  Elevated left atrial pressure.   2. Right ventricular systolic function is normal. The right ventricular  size is normal.   3. The mitral valve is normal in structure. No evidence of mitral valve  regurgitation. No evidence of mitral stenosis.   4. The aortic valve is tricuspid. Aortic valve regurgitation is not  visualized. No aortic stenosis is present.   5. The inferior vena cava is normal in size with greater than 50%  respiratory variability, suggesting right atrial pressure of 3 mmHg.    Laboratory Data: High Sensitivity Troponin:   Recent Labs  Lab 10/06/23 1733 10/11/23 1258 10/11/23 1635 10/12/23 0823 10/12/23 1029  TROPONINIHS 155* 897* 1,127* 4,864* 5,169*     Chemistry Recent Labs  Lab 10/11/23 0620 10/11/23 0931 10/11/23 0949 10/11/23 1635 10/12/23 0502  NA 131*   < > 131* 134* 132*  K 4.5   < > 4.8 4.5 4.7  CL 95*  --  92* 92* 93*  CO2 19*  --  16* 16* 17*  GLUCOSE 184*  --  284* 379* 403*  BUN 53*  --  58* 61* 69*  CREATININE 10.18*  --  10.48* 10.59* 11.17*  CALCIUM  7.8*  --  7.6* 7.4* 7.1*  MG 1.9  --  3.5*  --   --   GFRNONAA 5*  --  5* 5* 5*  ANIONGAP 17*  --  23* 26* 22*   < > = values in this interval not displayed.    Recent Labs  Lab 10/06/23 1400 10/07/23 0523 10/11/23 0620  10/11/23 0949 10/11/23 1635 10/12/23 0502  PROT 7.1  --  6.5 6.5  --   --   ALBUMIN  2.7*   < > 1.9* 2.0* 2.8* 2.4*  AST 24  --  35 87*  --   --   ALT 17  --  25 52*  --   --   ALKPHOS 101  --  114 113  --   --   BILITOT 0.5  --  1.0 1.6*  --   --    < > = values in this interval not displayed.   Hematology Recent Labs  Lab 10/11/23 0620 10/11/23 0931 10/11/23 0949 10/11/23 1325 10/11/23 2024 10/12/23 0036 10/12/23 0502 10/12/23 1029  WBC 27.1*  --  31.8*  --   --   --  31.5*  --   RBC 4.82  --  4.41  --   --   --  4.10*  --   HGB 13.7   < > 12.8*   < > 11.7* 11.6* 11.7*  --   HCT 41.9   < > 38.4*   < > 34.4* 34.6* 35.1*  --   MCV 86.9  --  87.1  --   --   --  85.6  --   MCH 28.4  --  29.0  --   --   --  28.5  --   MCHC 32.7  --  33.3  --   --   --  33.3  --   RDW 15.1  --  15.2  --   --   --  15.1  --   PLT 474*  --  470*  --   --   --  486* 411*   < > = values in this interval not displayed.   BNP Recent Labs  Lab 10/06/23 1359  BNP 526.2*    DDimer  Recent Labs  Lab 10/12/23 1029  DDIMER 2.11*    Radiology/Studies:  DG CHEST PORT 1 VIEW Result Date: 10/12/2023 CLINICAL DATA:  Pleural effusion. EXAM: PORTABLE CHEST 1 VIEW COMPARISON:  October 11, 2023. FINDINGS: Stable cardiomediastinal silhouette. Endotracheal and nasogastric tubes are in grossly good position. Bilateral internal jugular catheters are unchanged. Defibrillator is unchanged. Stable right pleural effusion is noted with associated right basilar atelectasis or infiltrate. Pigtail catheter in right lung base is unchanged. IMPRESSION: Stable support apparatus. Stable right pleural effusion with associated right basilar atelectasis or infiltrate. Electronically Signed   By: Lynwood Landy Raddle M.D.   On: 10/12/2023 08:25   CT HEAD WO CONTRAST ( ) Result Date: 10/11/2023 CLINICAL DATA:  08/05/2022 EXAM: CT HEAD WITHOUT CONTRAST TECHNIQUE: Contiguous axial images were obtained from the base of the skull  through the vertex without intravenous contrast. RADIATION DOSE REDUCTION: This exam was performed according to the departmental dose-optimization program which includes automated exposure control, adjustment of the mA and/or kV according to patient size and/or use of iterative reconstruction technique. COMPARISON:  08/05/2022 FINDINGS: Brain: No evidence of acute infarct or hemorrhage. The lateral ventricles and midline structures are unremarkable. No acute extra-axial fluid collections. No mass effect. Vascular: There is residual intravascular contrast from preceding CT chest. Given time since prior chest CT this may reflect an element of underlying renal insufficiency. Diffuse atherosclerosis of the internal carotid arteries. Skull: Normal. Negative for fracture or focal lesion. Sinuses/Orbits: Endotracheal and enteric catheter are identified within the oropharynx. Mucosal thickening within the bilateral maxillary sinuses, with evidence of prior medial wall antrectomies. Other: None. IMPRESSION: 1. No acute intracranial process. Electronically Signed   By: Ozell Daring M.D.   On: 10/11/2023 15:21   DG Abd Portable 1V Result Date: 10/11/2023 CLINICAL DATA:  Orogastric tube placement. EXAM: PORTABLE ABDOMEN - 1 VIEW COMPARISON:  None Available. FINDINGS: Orogastric tube extends into the stomach. Visualized bowel gas demonstrates mild gaseous distension of the colon likely reflective of ileus. Right lateral basilar pleural thoracostomy tube present. IMPRESSION: Orogastric tube extends into the stomach.  Probable colonic ileus. Electronically Signed   By: Marcey Moan M.D.   On: 10/11/2023 12:38   ECHOCARDIOGRAM COMPLETE Result Date: 10/11/2023    ECHOCARDIOGRAM REPORT   Patient Name:   Anthony Bullock [facility] Date of Exam: 10/11/2023 Medical Rec #:  [id number]     Height:       70.0 in Accession #:    [id number]    Weight:       180.3 lb Date of Birth:  1960-12-10     BSA:          1.998 m Patient Age:    63 years       BP:           91/36 mmHg Patient Gender: M             HR:           90 bpm. Exam Location:  Inpatient Procedure: 2D Echo and Intracardiac Opacification Agent (Both Spectral and Color            Flow Doppler were utilized during procedure). Indications:    Cardiac arrest  History:        Patient has prior history of Echocardiogram examinations. CAD.  Sonographer:  Charmaine Gaskins Referring Phys: [id number] NORLEEN BIRCH PAYNE IMPRESSIONS  1. No left ventricular thrombus is seen (Definity  contrast was used). Left ventricular ejection fraction, by estimation, is 35 to 40%. The left ventricle has moderately decreased function. The left ventricle demonstrates global hypokinesis. Left ventricular diastolic parameters are consistent with Grade I diastolic dysfunction (impaired relaxation).  2. Right ventricular systolic function is normal. The right ventricular size is normal. Tricuspid regurgitation signal is inadequate for assessing PA pressure.  3. The mitral valve is normal in structure. No evidence of mitral valve regurgitation. No evidence of mitral stenosis.  4. The aortic valve is tricuspid. Aortic valve regurgitation is not visualized. Aortic valve sclerosis is present, with no evidence of aortic valve stenosis.  5. The inferior vena cava is normal in size with greater than 50% respiratory variability, suggesting right atrial pressure of 3 mmHg. Comparison(s): Prior images reviewed side by side. The left ventricular function is unchanged. Even with Definity  contrast, regional wall motion is difficult to analyze. The reduction in LV function seems to be primarily due to global hypokinesis and profound systolic dyssynchrony due to abnormal electrical activation (a very broad QRS complex is present, appears longer duration compared to previous studies). FINDINGS  Left Ventricle: No left ventricular thrombus is seen (Definity  contrast was used). Left ventricular ejection fraction, by estimation, is 35 to 40%. The left  ventricle has moderately decreased function. The left ventricle demonstrates global hypokinesis.  Definity  contrast agent was given IV to delineate the left ventricular endocardial borders. The left ventricular internal cavity size was normal in size. There is no left ventricular hypertrophy. Abnormal (paradoxical) septal motion, consistent with left bundle branch block. Left ventricular diastolic parameters are consistent with Grade I diastolic dysfunction (impaired relaxation). Normal left ventricular filling pressure. Right Ventricle: The right ventricular size is normal. No increase in right ventricular wall thickness. Right ventricular systolic function is normal. Tricuspid regurgitation signal is inadequate for assessing PA pressure. Left Atrium: Left atrial size was normal in size. Right Atrium: Right atrial size was normal in size. Pericardium: There is no evidence of pericardial effusion. Mitral Valve: The mitral valve is normal in structure. No evidence of mitral valve regurgitation. No evidence of mitral valve stenosis. Tricuspid Valve: The tricuspid valve is grossly normal. Tricuspid valve regurgitation is not demonstrated. Aortic Valve: The aortic valve is tricuspid. Aortic valve regurgitation is not visualized. Aortic valve sclerosis is present, with no evidence of aortic valve stenosis. Pulmonic Valve: The pulmonic valve was not well visualized. Pulmonic valve regurgitation is not visualized. No evidence of pulmonic stenosis. Aorta: The aortic root and ascending aorta are structurally normal, with no evidence of dilitation. Venous: The inferior vena cava is normal in size with greater than 50% respiratory variability, suggesting right atrial pressure of 3 mmHg. IAS/Shunts: The interatrial septum was not well visualized.  LEFT VENTRICLE PLAX 2D LVIDd:         5.28 cm LVIDs:         3.77 cm LV PW:         1.10 cm LV IVS:        1.10 cm LVOT diam:     2.20 cm LVOT Area:     3.80 cm  RIGHT VENTRICLE RV S  prime:     11.10 cm/s LEFT ATRIUM         Index LA diam:    2.90 cm 1.45 cm/m   AORTA Ao Root diam: 2.80 cm Ao Asc diam:  2.60 cm MITRAL VALVE  MV Area (PHT): 4.60 cm    SHUNTS MV Decel Time: 165 msec    Systemic Diam: 2.20 cm MV E velocity: 47.80 cm/s MV A velocity: 60.10 cm/s MV E/A ratio:  0.80 Mihai Croitoru MD Electronically signed by Jerel Balding MD Signature Date/Time: 10/11/2023/10:35:19 AM    Final    DG Chest Port 1 View Result Date: 10/11/2023 CLINICAL DATA:  Chest tube. EXAM: PORTABLE CHEST 1 VIEW COMPARISON:  October 10, 2023. FINDINGS: Interval placement of pigtail chest tube into right pleural effusion, which is decreased compared to prior exam. Associated right basilar atelectasis or infiltrate is noted. IMPRESSION: Interval placement of pigtail chest tube into right pleural effusion. Electronically Signed   By: Lynwood Landy Raddle M.D.   On: 10/11/2023 09:37   CT Angio Chest Pulmonary Embolism (PE) W or WO Contrast Result Date: 10/11/2023 CLINICAL DATA:  Shortness of breath. EXAM: CT ANGIOGRAPHY CHEST WITH CONTRAST TECHNIQUE: Multidetector CT imaging of the chest was performed using the standard protocol during bolus administration of intravenous contrast. Multiplanar CT image reconstructions and MIPs were obtained to evaluate the vascular anatomy. RADIATION DOSE REDUCTION: This exam was performed according to the departmental dose-optimization program which includes automated exposure control, adjustment of the mA and/or kV according to patient size and/or use of iterative reconstruction technique. CONTRAST:  75mL OMNIPAQUE  IOHEXOL  350 MG/ML SOLN COMPARISON:  Chest CT 10/08/2023 FINDINGS: Cardiovascular: The heart size is upper normal to borderline enlarged. Small pericardial effusion. Coronary artery calcification is evident. Moderate atherosclerotic calcification is noted in the wall of the thoracic aorta. Enlargement of the pulmonary outflow tract/main pulmonary arteries suggests pulmonary  arterial hypertension. There is no filling defect within the opacified pulmonary arteries to suggest the presence of an acute pulmonary embolus. The tip of the right IJ central line is positioned at the SVC/RA junction. Mediastinum/Nodes: No mediastinal lymphadenopathy. There is no hilar lymphadenopathy. The esophagus has normal imaging features. There is no axillary lymphadenopathy. Lungs/Pleura: 1.9 cm ground-glass nodule in the anterior left upper lobe (37/6) is similar to prior. 17 mm ground-glass opacity anterior left lung on 59/6 is new in the interval. Atelectasis noted dependent left lung base. There is a large right pleural effusion with loculated components superiorly in this is progressive in the interval (compare axial 40/6 today to axial 39/4 previously). Collapse/consolidative opacity is seen in all 3 lobes of the right lung. As on the prior study, there is extensive pleural soft tissue nodularity in the right hemithorax. A tiny gas bubble along the posterolateral pleura of the right hemithorax (267/7) is compatible with recent thoracentesis. Upper Abdomen: High density in the gallbladder lumen likely vicarious excretion of contrast material from previous chest CT. Musculoskeletal: Bony changes in the right fifth through seventh ribs again noted secondary to the presence of a chest wall mass. Review of the MIP images confirms the above findings. IMPRESSION: 1. No CT evidence for acute pulmonary embolus. 2. Large right pleural effusion with loculated components superiorly. This is progressive in the interval. 3. Collapse/consolidative opacity in all 3 lobes of the right lung. 4. Extensive pleural soft tissue nodularity in the right hemithorax with bony changes in the right fifth through seventh ribs secondary to the presence of a chest wall mass. 5. 1.9 cm ground-glass nodule in the anterior left upper lobe is similar to prior. 17 mm ground-glass opacity anterior left lung is new in the interval. These  may be infectious/inflammatory but warrant follow-up to ensure stability. 6. Enlargement of the pulmonary outflow tract/main pulmonary  arteries suggests pulmonary arterial hypertension. 7.  Aortic Atherosclerosis (ICD10-I70.0). Electronically Signed   By: Camellia Candle M.D.   On: 10/11/2023 06:14   DG CHEST PORT 1 VIEW Result Date: 10/10/2023 CLINICAL DATA:  100030. Leukocytosis and pleural effusion. End-stage renal disease. Hypotension. EXAM: PORTABLE CHEST 1 VIEW COMPARISON:  Chest CT 10/08/2023. FINDINGS: 6:40 a.m. External defibrillator with left chest power source and presternal wire insertion are unchanged as well as a right IJ dialysis catheter with the tip in the right atrium. Moderate-to-large loculated right pleural effusion with overlying lung opacities, appears increased from 2 days ago. Extensive nodular pleural metastatic disease on the right is better seen with CT, not well demonstrated radiographically. Left lung remains clear. Stable mediastinum. There is aortic atherosclerosis. The cardiac size is normal. No new osseous finding. IMPRESSION: 1. Moderate-to-large loculated right pleural effusion with overlying lung opacities, appears increased from 2 days ago. 2. Extensive nodular pleural metastatic disease on the right is better seen with CT, not well demonstrated radiographically. 3. Aortic atherosclerosis. Electronically Signed   By: Francis Quam M.D.   On: 10/10/2023 06:54   CT CHEST W CONTRAST Result Date: 10/09/2023 CLINICAL DATA:  Possible pulmonary mass on recent chest x-ray EXAM: CT CHEST WITH CONTRAST TECHNIQUE: Multidetector CT imaging of the chest was performed during intravenous contrast administration. RADIATION DOSE REDUCTION: This exam was performed according to the departmental dose-optimization program which includes automated exposure control, adjustment of the mA and/or kV according to patient size and/or use of iterative reconstruction technique. CONTRAST:  75mL OMNIPAQUE   IOHEXOL  350 MG/ML SOLN COMPARISON:  Chest x-ray earlier same day FINDINGS: Cardiovascular: The heart size is normal. No substantial pericardial effusion. Coronary artery calcification is evident. Moderate atherosclerotic calcification is noted in the wall of the thoracic aorta. Right IJ central line tip is positioned at the SVC/RA junction. Extracardiac subcutaneous implantable defibrillator evident. Mediastinum/Nodes: 13 mm short axis paraesophageal lymph node seen anterior to the spine on 68/3. Upper normal right hilar lymph nodes. No left hilar lymphadenopathy. Juxta diaphragmatic ingested cardiac lymphadenopathy is seen in the anterior right chest there is no axillary lymphadenopathy. The esophagus has normal imaging features. Lungs/Pleura: Centrilobular and paraseptal emphysema evident. 17 mm ground-glass opacity identified anterior left upper lobe on 39/4. 5 mm left lower lobe nodule seen on 119/4. Subsegmental atelectasis noted left base. Volume loss noted right upper and middle lobes with more pronounced collapse/consolidation in the right lower lobe. Areas of loculated pleural fluid are seen in the upper right hemithorax. Extensive pleural nodularity is seen in the right chest including plaque-like nodular pleural thickening inferolaterally on 95/3. Posterior right pleural nodule measures 4.0 x 1.0 cm on 128/3. Confluent soft tissue is seen in the anterior and posterior pleural reflections. The anterolateral pleural disease extends into the right chest wall (see image 101/3) with pathologic fracture of the anterior right fifth rib and destruction of the anterior right sixth and seventh ribs. Upper Abdomen: Visualized portion of the upper abdomen shows no acute findings. Musculoskeletal: See above for bony changes in anterior right fifth through seventh ribs. Otherwise no suspicious lytic or sclerotic osseous abnormality. IMPRESSION: 1. Extensive pleural nodularity in the right chest including plaque-like  nodular pleural thickening inferolaterally. The anterolateral pleural disease extends into the right chest wall with pathologic fracture of the anterior right fifth rib and destruction of the anterior right sixth and seventh ribs. Imaging features are highly suspicious for malignancy. 2. 17 mm ground-glass opacity anterior left upper lobe. This is nonspecific and may  be infectious/inflammatory. 3. 5 mm left lower lobe pulmonary nodule. 4. 13 mm short axis paraesophageal lymph node anterior to the spine. Metastatic disease a concern. 5. Aortic Atherosclerosis (ICD10-I70.0) and Emphysema (ICD10-J43.9). Electronically Signed   By: Camellia Candle M.D.   On: 10/09/2023 06:58     Assessment and Plan: Post Cardiac Arrest Troponin elevation Event occurred after patient became tachypneic, tachycardic, and hypoxic. Patient was undergoing chest tube placement when he went into VT then ultimately pulseless VT. CPR was initiated. He went into V-Fib requiring shock. CPR was 10 minutes, ROSC achieved.  Patient still requiring cardiovascular and respiratory support though weaning off.   New 1st Degree AV block noted on yesterday's 12 lead ECG. On telemetry patient noted to be bradycardic, 12 ECG pending.  Echo unchanged from prior to arrest.  CTA showed no PE, though progressed RT pleural effusion with loculation.  CT head without acute intracranial pathology  Troponin after arrest: 897 -> 1127 -> 4864 -> 5169   Etiology most likely respiratory in nature. He does have a history of CAD, however given his current DIC and inability to fully anticoagulate/maximize antiplatelet therapy he is not a candidate for the cath lab for coronary evaluation.   Paroxysmal atrial fibrillation Patient's amiodarone , coreg , and eliquis  held in the setting of acute septic shock, post cardiac arrest, and DIC.  [place] Vasc score 4  Chronic systolic and diastolic heart failure  Echocardiograms this admission unchanged pre/post cardiac  arrest.  Suspect his recent LVEF reduction 2/2 profound systolic dyssynchrony 2/2 LBBB GDMT limited by hypotension and renal function Volume per CRRT  CAD s/p DES to LAD/1st Diag and RCA Hyperlipidemia  Recent GI bleed precluding the use of ASA Continue plavix  as okay per primary Continue lipitor  80 mg  [See above about candidacy for ischemic evaluation at this time]  Per primary Septic shock  DIC Acute respiratory failure with hypoxia Possible lung malignancy Pneumonia ESRD on HD Hyponatremia  T2DM with hyperglycemia S/p bilateral BKA   Risk Assessment/Risk Scores:       [place]  Heart Association (NYHA) Functional Class Unable to assess as patient is intubated  CHA2DS2-VASc Score = 4   This indicates a 4.8% annual risk of stroke. The patient's score is based upon: CHF History: 1 HTN History: 1 Diabetes History: 1 Stroke History: 0 Vascular Disease History: 1 Age Score: 0 Gender Score: 0        For questions or updates, please contact [facility] Please consult [email or web address] for contact info under      Signed, Leontine LOISE Salen, PA-C  10/12/2023 1:06 PM

## 2023-10-12 NOTE — Progress Notes (Signed)
 og  NAME:  Anthony Bullock, MRN:  [id number], DOB:  [birth year], LOS: 5 ADMISSION DATE:  10/06/2023, CONSULTATION DATE:  10/09/23 REFERRING MD:  Dr. Trixie, CHIEF COMPLAINT:  SOB    History of Present Illness:   46 yoM with PMH significant for COPD, ESRD TTS HD, HTN, HLD, HFrEF EF 40, HFpEF, PAF on Eliquis , CAD on plavix , DMT2, PVD s/p bilateral BKA, anemia, GIB who presented 9/26 with one month hx of SOB and intermittent right sided chest pain radiating into R shoulder.  No cough or fever. CXR performed while at HD 9/25 which showed right pleural effusion, recommended further evaluation and sent to ER.   Former smoker, quit 13 yrs ago.  Has 64 pack yr history.  Pt is not always forth coming with specific information, wife at bedside is helpful.  Pt reports no recent fever or chills, N/V.  Has had sinus congestion with yellow drainage and tint of blood for several days and as of yesterday having intermittent yellowish productive cough.  Does report exertional dyspnea and intermittent pleuritic pain with inspiration and R shoulder pain for at least 4 weeks.  Poor appetite over last week and reports weight loss, ?20lbs but on chart review, is down ~6kg since May.  Also, wife reports pt had fall with resultant right sided chest pain after hospitalization in May for pulmonary edema and April for GIB and s/p PCI to LAD/ RCA 04/12/23; pt did not seek evaluation after fall.  Also had fall last week after getting off lawn mower after prolonged use, denies hitting head. Wife reports it is not uncommon for him to fall due to weakness after HD and ongoing use of BLE prosthetics. On imaging review, pt has had moderate right pleural effusion since CXR in May 2025.  On admit, required 4L [place].  Afebrile and normal WBC/ differential. LFTs normal. Admitted to [facility].  Underwent IR thoracentesis on 9/26 with 1.85L of amber colored pleural fluid however was not sent for studies.  CXR 9/28 showed increasing R pleural effusion, tracking  laterally and underwent IR thoracentesis with of amber fluid which was sent for cell count and cytology.  Pt did report improvement in SOB after thoracentesis's and weaned to room air 9/28.  Chest CT w/ contrast performed after thoracentesis which showed extensive pleural nodularity in right chest, posterior right pleural nodule measures 4x1, with plaque-like nodular pleural thickening inferolaterally extending into right chest wall with pathologic fracture of anterior 5th and destruction of 6/7 ribs suspicious for malignancy, 17mm GGO in LUL, 5mm LLL pulmonary nodule, 13 mm paraesophageal lymph node, and RUL an RML volume loss with collapse/ consolidation in RLL with areas of loculated pleural fluid in right upper hemithorax.  Pleural fluid noted straw colored, LDH 660, neutrophil predominant, glucose 160, protein 5.5.  Pulmonary consulted for further recommendations.   On 10/1, Patient became hypotensive requiring Levophed .  Increasing WBC. Patient without complaints. Patient being followed by PCCM for pulmonary. Labs and scans reviewed-CT chest with contrast 9/28- not a PE protocol study, nodularity suggestive of possible malignancy, loculated large right pleural effusion.  Pertinent  Medical History  Former smoker, COPD, ESRD TTS HD, hypotension on midodrine , HTN, HLD, HFrEF, PAF, CAD, DMT2, peripheral neuropathy, PVD s/p bilateral BKA, anemia, GIB  Significant Hospital Events: Including procedures, antibiotic start and stop dates in addition to other pertinent events   9/26 admit, IR R thora 1.85 ml- no fluid sent 9/28 IR thora 520 ml, pleural studies sent 10/1 transferred to icu  with hypotension started on pressors and respiratory distress; brief cardiac arrest rosc 10 minutes; intubated  Interim History / Subjective:  Patient remains on 3 pressors Waking up following commands on sedation  Objective    Blood pressure (!) 132/44, pulse 62, temperature 98.3 F (36.8 C), temperature  source Axillary, resp. rate (!) 24, height 5' 10 (1.778 m), weight 81.8 kg, SpO2 100%. CVP:  [13 mmHg-20 mmHg] 13 mmHg  Vent Mode: PRVC FiO2 (%):  [50 %-100 %] 50 % Set Rate:  [18 bmp-24 bmp] 24 bmp Vt Set:  [580 mL] 580 mL PEEP:  [8 cmH20-10 cmH20] 8 cmH20 Plateau Pressure:  [23 cmH20-26 cmH20] 23 cmH20   Intake/Output Summary (Last 24 hours) at 10/12/2023 0749 Last data filed at 10/12/2023 0700 Gross per 24 hour  Intake 4077.57 ml  Output 1790 ml  Net 2287.57 ml   Filed Weights   10/07/23 1230 10/10/23 1734 10/10/23 1915  Weight: 84.4 kg 81.9 kg 81.8 kg   Examination: General:  critically ill appearing on mech vent HEENT: MM pink/moist; ETT in place Neuro: sedate on fentanyl  300; per nurse waking up following commands; right pupil 5mm and left 2mm, both sluggish CV: s1s2, RRR, no m/r/g PULM:  more dim bs on right; on mech vent PRVC GI: soft, bsx4 active  Extremities: warm/dry, b/l bka Skin: no rashes or lesions   Resolved problem list   Assessment and Plan   Cardiac arrest: likely respiratory related given hypoxia and resp distress; went into vtac and pulseless; then rhythm showing coarse vfib when pads placed requiring shock. Rosc 10 minutes Shock: presumed septic Plan: -continue pressors levo, epi and vaso for SBP goal >110 -cards consulted; trend troponin; check cvp -cvp range 15; crrt for volume removal -stress dose steroids -trend LA -trend electrolytes and replete as needed -f/u bcx2, trach culture and pleural culture -cont zosyn  and doxy -cont midodrine   Acute respiratory failure w/ hypoxia Right loculated pleural effusion RLL consolidation vs atelectasis with extensive right pleural nodularity with suspected pathologic right rib anterior rib fx 5/6/ 7, LUL GGO and LLL pleural nodule Possible PNA Possible lung malignancy Former smoker Hx of COPD? Right effusion dating back to May 2025 after admit for pulmonary edema/ hypervolemia ddx concerning for  malignancy, possible infectious vs inflammatory etiology.   Plan: -cxr this am w/ persistent small-moderate loculated effusion; CT put out about 1.8L last 24 hours; cont CT to suction an d monitor outpt; eventually will likely need tube lytics when stable -LTVV strategy with tidal volumes of 6-8 cc/kg ideal body weight -Wean PEEP/FiO2 for SpO2 >92% -VAP bundle in place -Daily SAT and SBT when appropriate -PAD protocol in place -wean sedation for RASS goal 0 to -1 -f/u Pleural culture, cytology, trach aspirate -abx as above -IR consulted for perc biopsy to rule out malignancy when stable if repeat cytology on 10/1 negative -trend cxr -triple therapy nebs; prn duoneb  ESRD on HD Hyponatremia Plan: -nephro following; plan to start CRRT today -Trend BMP -Replace electrolytes as indicated -Avoid nephrotoxic agents, ensure adequate renal perfusion  PAF Plan: -po amio currently on hold; currently in sinus -hold carvedilol  with shock -heparin  iv  Chronic systolic chf HLD CAD -Repeat 2D echo 9/28 shows LVEF 30 to 35%, global hypokinesis, grade 1 diastolic dysfunction  Plan: -CRRT for volume removal -daily weights; strict I/o's -statin -cont plavix   T2DM Plan: -on insulin  drip currently; cbg monitoring  Best Practice (right click and Reselect all SmartList Selections daily)   Diet/type: tubefeeds DVT  prophylaxis: systemic heparin  GI prophylaxis: H2B Lines: Central line, Dialysis Catheter, and Arterial Line Foley:  N/A Code Status:  full code Last date of multidisciplinary goals of care discussion [10/1 updated wife at bedside. Going to discuss with family to discuss dnr. States he would not want to be on vent long term.]   Labs   CBC: Recent Labs  Lab 10/06/23 1359 10/07/23 0523 10/10/23 0206 10/10/23 1626 10/11/23 0620 10/11/23 0931 10/11/23 0949 10/11/23 1325 10/11/23 2024 10/12/23 0036 10/12/23 0502  WBC 9.9   < > 19.7* 21.4* 27.1*  --  31.8*  --   --    --  31.5*  NEUTROABS 7.0  --   --   --  23.0*  --   --   --   --   --   --   HGB 13.6   < > 13.1 12.2* 13.7   < > 12.8* 12.1* 11.7* 11.6* 11.7*  HCT 42.3   < > 39.9 36.6* 41.9   < > 38.4* 36.6* 34.4* 34.6* 35.1*  MCV 88.3   < > 87.3 85.7 86.9  --  87.1  --   --   --  85.6  PLT 240   < > 301 304 474*  --  470*  --   --   --  486*   < > = values in this interval not displayed.    Basic Metabolic Panel: Recent Labs  Lab 10/08/23 0244 10/09/23 0258 10/10/23 0206 10/11/23 0620 10/11/23 0931 10/11/23 0949 10/11/23 1635 10/12/23 0502  NA 133* 132* 134* 131* 131* 131* 134* 132*  K 3.6 3.6 3.9 4.5 4.7 4.8 4.5 4.7  CL 96* 95* 96* 95*  --  92* 92* 93*  CO2 24 22 21* 19*  --  16* 16* 17*  GLUCOSE 170* 212* 172* 184*  --  284* 379* 403*  BUN 30* 47* 62* 53*  --  58* 61* 69*  CREATININE 7.66* 10.09* 12.07* 10.18*  --  10.48* 10.59* 11.17*  CALCIUM  8.6* 8.4* 8.4* 7.8*  --  7.6* 7.4* 7.1*  MG  --   --   --  1.9  --  3.5*  --   --   PHOS 3.3 3.9 5.2*  --   --   --  6.7* 6.9*   GFR: Estimated Creatinine Clearance: 7.1 mL/min (A) (by C-G formula based on SCr of 11.17 mg/dL (H)). Recent Labs  Lab 10/10/23 0206 10/10/23 1626 10/11/23 0620 10/11/23 0940 10/11/23 0949 10/12/23 0502  PROCALCITON 2.96  --   --   --   --   --   WBC 19.7* 21.4* 27.1*  --  31.8* 31.5*  LATICACIDVEN  --   --  1.8 3.1*  --   --     Liver Function Tests: Recent Labs  Lab 10/06/23 1400 10/07/23 0523 10/10/23 0206 10/11/23 0620 10/11/23 0949 10/11/23 1635 10/12/23 0502  AST 24  --   --  35 87*  --   --   ALT 17  --   --  25 52*  --   --   ALKPHOS 101  --   --  114 113  --   --   BILITOT 0.5  --   --  1.0 1.6*  --   --   PROT 7.1  --   --  6.5 6.5  --   --   ALBUMIN  2.7*   < > 2.2* 1.9* 2.0* 2.8* 2.4*   < > =  values in this interval not displayed.   Recent Labs  Lab 10/06/23 1400  LIPASE 25   No results for input(s): AMMONIA in the last 168 hours.  ABG    Component Value Date/Time   PHART  7.319 (L) 10/11/2023 0931   PCO2ART 34.4 10/11/2023 0931   PO2ART 123 (H) 10/11/2023 0931   HCO3 17.7 (L) 10/11/2023 0931   TCO2 19 (L) 10/11/2023 0931   ACIDBASEDEF 8.0 (H) 10/11/2023 0931   O2SAT 99 10/11/2023 0931     Coagulation Profile: No results for input(s): INR, PROTIME in the last 168 hours.  Cardiac Enzymes: No results for input(s): CKTOTAL, CKMB, CKMBINDEX, TROPONINI in the last 168 hours.  HbA1C: Hemoglobin A1C  Date/Time Value Ref Range Status  09/20/2023 02:10 PM 6.7 (A) 4.0 - 5.6 % Final  06/19/2023 01:57 PM 5.8 (A) 4.0 - 5.6 % Final  02/15/2018 12:00 AM 11.6  Final    Comment:    [facility] in [place]   Hgb A1c MFr Bld  Date/Time Value Ref Range Status  11/13/2019 04:43 PM 6.5 (H) 4.8 - 5.6 % Final    Comment:    (NOTE) Pre diabetes:          5.7%-6.4%  Diabetes:              >6.4%  Glycemic control for   <7.0% adults with diabetes   07/10/2019 04:44 AM 8.1 (H) 4.8 - 5.6 % Final    Comment:    (NOTE)         Prediabetes: 5.7 - 6.4         Diabetes: >6.4         Glycemic control for adults with diabetes: <7.0     CBG: Recent Labs  Lab 10/12/23 0158 10/12/23 0402 10/12/23 0439 10/12/23 0548 10/12/23 0649  GLUCAP 368* 400* 387* 363* 313*    Review of Systems:   Patient is sedate and/or intubated; therefore, history has been obtained from chart review.     Past Medical History:  He,  has a past medical history of AICD (automatic cardioverter/defibrillator) present, Allergic rhinitis, Anemia, Arthritis, Chronic systolic heart failure (HCC), COPD (chronic obstructive pulmonary disease) (HCC), Diabetes mellitus type II, Diabetic nephropathy (HCC), Diabetic neuropathy (HCC), ESRD on hemodialysis (HCC), History of cardiac catheterization, History of kidney stones, Hyperlipidemia, Hypertension, Kidney stones, NICM (nonischemic cardiomyopathy) (HCC), Osteomyelitis (HCC), Pneumonia, Urethral stricture, and Wears glasses.   Surgical  History:   Past Surgical History:  Procedure Laterality Date   ABDOMINAL AORTOGRAM W/LOWER EXTREMITY N/A 03/30/2016   Procedure: Abdominal Aortogram w/Lower Extremity;  Surgeon: Lonni GORMAN Blade, MD;  Location: [facility] INVASIVE CV LAB;  Service: Cardiovascular;  Laterality: N/A;   AMPUTATION Right 04/26/2016   Procedure: Right Below Knee Amputation;  Surgeon: Jerona Harden GAILS, MD;  Location: [facility] OR;  Service: Orthopedics;  Laterality: Right;   AMPUTATION Left 08/21/2019   Procedure: LEFT FOOT 5TH RAY AMPUTATION;  Surgeon: Harden Jerona GAILS, MD;  Location: [facility] OR;  Service: Orthopedics;  Laterality: Left;   AMPUTATION Left 11/13/2019   Procedure: LEFT BELOW KNEE AMPUTATION;  Surgeon: Harden Jerona GAILS, MD;  Location: [facility] OR;  Service: Orthopedics;  Laterality: Left;   AV FISTULA PLACEMENT Right 09/08/2015   Procedure: INSERTION OF 4-40mm x 45cm  ARTERIOVENOUS (AV) GORE-TEX GRAFT RIGHT UPPER  ARM;  Surgeon: Lonni GORMAN Blade, MD;  Location: [facility] OR;  Service: Vascular;  Laterality: Right;   AV FISTULA PLACEMENT Left 01/14/2016   Procedure: CREATION OF LEFT UPPER  ARM ARTERIOVENOUS FISTULA;  Surgeon: Lonni GORMAN Blade, MD;  Location: [facility] OR;  Service: Vascular;  Laterality: Left;   BASCILIC VEIN TRANSPOSITION Right 08/22/2014   Procedure: RIGHT UPPER ARM BASCILIC VEIN TRANSPOSITION;  Surgeon: Lonni GORMAN Blade, MD;  Location: [facility] OR;  Service: Vascular;  Laterality: Right;   BELOW KNEE LEG AMPUTATION Right 04/26/2016   BIOPSY OF SKIN SUBCUTANEOUS TISSUE AND/OR MUCOUS MEMBRANE  04/28/2023   Procedure: BIOPSY, SKIN, SUBCUTANEOUS TISSUE, OR MUCOUS MEMBRANE;  Surgeon: Shila Gustav GAILS, MD;  Location: [facility] ENDOSCOPY;  Service: Gastroenterology;;   CARDIAC CATHETERIZATION     CARDIAC DEFIBRILLATOR PLACEMENT  06/27/2013   Sub Q       BY DR FERNANDE   CATARACT EXTRACTION W/PHACO Right 08/06/2018   Procedure: CATARACT EXTRACTION PHACO AND INTRAOCULAR LENS PLACEMENT (IOC);  Surgeon: Harrie Agent, MD;  Location: [facility] ORS;   Service: Ophthalmology;  Laterality: Right;  CDE: 4.06   CATARACT EXTRACTION W/PHACO Left 08/20/2018   Procedure: CATARACT EXTRACTION PHACO AND INTRAOCULAR LENS PLACEMENT (IOC);  Surgeon: Harrie Agent, MD;  Location: [facility] ORS;  Service: Ophthalmology;  Laterality: Left;  CDE: 6.76   COLONOSCOPY WITH PROPOFOL  N/A 07/22/2015   Procedure: COLONOSCOPY WITH PROPOFOL ;  Surgeon: Victory LITTIE Legrand DOUGLAS, MD;  Location: [facility] ENDOSCOPY;  Service: Gastroenterology;  Laterality: N/A;   CORONARY LITHOTRIPSY N/A 04/14/2023   Procedure: CORONARY LITHOTRIPSY;  Surgeon: Anner Alm ORN, MD;  Location: [facility] INVASIVE CV LAB;  Service: Cardiovascular;  Laterality: N/A;  RCA   CORONARY STENT INTERVENTION N/A 04/12/2023   Procedure: CORONARY STENT INTERVENTION;  Surgeon: Swaziland, Peter M, MD;  Location: [facility] INVASIVE CV LAB;  Service: Cardiovascular;  Laterality: N/A;   CORONARY STENT INTERVENTION N/A 04/14/2023   Procedure: CORONARY STENT INTERVENTION;  Surgeon: Anner Alm ORN, MD;  Location: [facility] INVASIVE CV LAB;  Service: Cardiovascular;  Laterality: N/A;   ESOPHAGOGASTRODUODENOSCOPY N/A 04/28/2023   Procedure: EGD (ESOPHAGOGASTRODUODENOSCOPY);  Surgeon: Nandigam, Kavitha V, MD;  Location: [facility] ENDOSCOPY;  Service: Gastroenterology;  Laterality: N/A;   FEMORAL-POPLITEAL BYPASS GRAFT Right 03/31/2016   Procedure: BYPASS GRAFT FEMORAL-POPLITEAL ARTERY USING RIGHT GREATER SAPHENOUS NONREVERSED VEIN;  Surgeon: Lonni GORMAN Blade, MD;  Location: [facility] OR;  Service: Vascular;  Laterality: Right;   HERNIA REPAIR     I & D EXTREMITY Right 03/31/2016   Procedure: IRRIGATION AND DEBRIDEMENT FOOT;  Surgeon: Lonni GORMAN Blade, MD;  Location: [facility] OR;  Service: Vascular;  Laterality: Right;   IMPLANTABLE CARDIOVERTER DEFIBRILLATOR IMPLANT N/A 06/27/2013   Procedure: SUB Q ICD;  Surgeon: Elspeth JAYSON FERNANDE, MD;  Location: [facility] CATH LAB;  Service: Cardiovascular;  Laterality: N/A;   INTRAOPERATIVE ARTERIOGRAM Right 03/31/2016   Procedure: INTRA OPERATIVE  ARTERIOGRAM;  Surgeon: Lonni GORMAN Blade, MD;  Location: [facility] OR;  Service: Vascular;  Laterality: Right;   IR GENERIC HISTORICAL Right 11/30/2015   IR THROMBECTOMY AV FISTULA W/THROMBOLYSIS/PTA INC/SHUNT/IMG RIGHT 11/30/2015 Marcey Moan, MD [facility]   IR GENERIC HISTORICAL  11/30/2015   IR US  GUIDE VASC ACCESS RIGHT 11/30/2015 Marcey Moan, MD [facility]   IR GENERIC HISTORICAL Right 12/15/2015   IR THROMBECTOMY AV FISTULA W/THROMBOLYSIS/PTA/STENT INC/SHUNT/IMG RT 12/15/2015 Toribio Faes, MD [facility]   IR GENERIC HISTORICAL  12/15/2015   IR US  GUIDE VASC ACCESS RIGHT 12/15/2015 Toribio Faes, MD [facility]   IR GENERIC HISTORICAL  12/28/2015   IR FLUORO GUIDE CV LINE RIGHT 12/28/2015 Rome Hall, MD [facility]   IR GENERIC HISTORICAL  12/28/2015   IR US  GUIDE VASC ACCESS RIGHT 12/28/2015 Rome Hall,  MD [facility]   LEFT A ND RIGHT HEART CATH  01/30/2013   DR CHET   LEFT AND RIGHT HEART CATHETERIZATION WITH CORONARY ANGIOGRAM N/A 01/30/2013   Procedure: LEFT AND RIGHT HEART CATHETERIZATION WITH CORONARY ANGIOGRAM;  Surgeon: Toribio JONELLE Fuel, MD;  Location: [facility] CATH LAB;  Service: Cardiovascular;  Laterality: N/A;   LEFT HEART CATH AND CORONARY ANGIOGRAPHY N/A 04/12/2023   Procedure: LEFT HEART CATH AND CORONARY ANGIOGRAPHY;  Surgeon: Swaziland, Peter M, MD;  Location: [facility] INVASIVE CV LAB;  Service: Cardiovascular;  Laterality: N/A;   PERIPHERAL VASCULAR CATHETERIZATION Right 01/26/2015   Procedure: A/V Fistulagram;  Surgeon: Lonni GORMAN Blade, MD;  Location: [facility] INVASIVE CV LAB;  Service: Cardiovascular;  Laterality: Right;   reapea urethral surgery for recurrent obstruction  2011   TOTAL KNEE ARTHROPLASTY Right 2007   VEIN HARVEST Right 03/31/2016   Procedure: RIGHT GREATER SAPHENOUS VEIN HARVEST;  Surgeon: Lonni GORMAN Blade, MD;  Location: [facility] OR;  Service: Vascular;  Laterality: Right;     Social History:   reports that he quit smoking about 13 years ago.  His smoking use included cigarettes. He started smoking about 45 years ago. He has a 64 pack-year smoking history. He has never used smokeless tobacco. He reports that he does not drink alcohol  and does not use drugs.   Family History:  His family history includes Alcohol  abuse in his father; Bladder Cancer in his mother; Diabetes in his maternal grandmother; Heart Problems in his maternal grandmother; Heart disease in his maternal grandfather; Melanoma in his father; Prostate cancer in his maternal grandfather; Stroke in his maternal grandmother.   Allergies Allergies  Allergen Reactions   Epoetin  Alfa Other (See Comments)    Unknown    Ferumoxytol  Other (See Comments)    Unknown    Morphine Sulfate Rash and Other (See Comments)    Itches all over, red spots     Home Medications  Prior to Admission medications   Medication Sig Start Date End Date Taking? Authorizing Provider  acetaminophen  (TYLENOL ) 325 MG tablet Take 1-2 tablets (325-650 mg total) by mouth every 4 (four) hours as needed for mild pain. 11/29/19  Yes Love, Sharlet GORMAN, PA-C  amiodarone  (PACERONE ) 200 MG tablet TAKE 1 TABLET ( 200 MG ) BY MOUTH TWICE DAILY FOR 20 DAYS, THEN 1 TABLET ( 200 MG TOTAL ) ONCE DAILY Patient taking differently: Take 200 mg by mouth daily. 09/18/23  Yes Burchette, Wolm ORN, MD  apixaban  (ELIQUIS ) 5 MG TABS tablet Take 1 tablet (5 mg total) by mouth 2 (two) times daily. 05/22/23  Yes Ghimire, Donalda HERO, MD  atorvastatin  (LIPITOR ) 80 MG tablet Take 1 tablet (80 mg total) by mouth at bedtime. 09/20/23  Yes Burchette, Wolm ORN, MD  carvedilol  (COREG ) 6.25 MG tablet Take 1 tablet (6.25 mg total) by mouth 2 (two) times daily. 09/20/23  Yes Burchette, Wolm ORN, MD  cinacalcet  (SENSIPAR ) 30 MG tablet Take 1 tablet (30 mg total) by mouth Every Tuesday,Thursday,and Saturday with dialysis. 11/30/19  Yes Love, Sharlet GORMAN, PA-C  clopidogrel  (PLAVIX ) 75 MG tablet Take 1 tablet (75 mg total) by mouth daily with breakfast.  08/14/23  Yes Burchette, Wolm ORN, MD  gabapentin  (NEURONTIN ) 300 MG capsule Take 1 capsule (300 mg total) by mouth at bedtime. 04/15/23  Yes Hongalgi, Anand D, MD  HUMALOG  KWIKPEN 200 UNIT/ML KwikPen INJECT A MAXIMUM OF 28 UNITS SUBCUTANEOUSLY TWICE DAILY WITH LUNCH AND SUPPER PER SLIDING SCALE. APPOINTMENT REQUIRED FOR FUTURE REFILLS 08/16/23  Yes Burchette, Wolm ORN, MD  lidocaine  (LIDODERM ) 5 % Place 1 patch onto the skin daily as needed (pain). Remove & Discard patch within 12 hours or as directed by MD   Yes [provider]  midodrine  (PROAMATINE ) 10 MG tablet Take 1 tablet (10 mg total) by mouth every 8 (eight) hours. Patient taking differently: Take 10 mg by mouth every 8 (eight) hours. Takes once a day on dialysis days ONLY 09/20/23  Yes Burchette, Wolm ORN, MD  multivitamin (RENA-VIT) TABS tablet Take 1 tablet by mouth once daily 07/06/21  Yes Burchette, Wolm ORN, MD  Olopatadine  HCl 0.2 % SOLN Place 1 drop into both eyes daily as needed (for allergies).    Yes [provider]  VENTOLIN  HFA 108 (90 Base) MCG/ACT inhaler INHALE 1 TO 2 PUFFS BY MOUTH EVERY 6 HOURS AS NEEDED FOR WHEEZING FOR SHORTNESS OF BREATH 05/05/23  Yes Burchette, Wolm ORN, MD  Continuous Glucose Receiver (FREESTYLE LIBRE 3 READER) DEVI Use to check blood glucose TID 08/11/22   Burchette, Wolm ORN, MD  Continuous Glucose Sensor (FREESTYLE LIBRE 3 PLUS SENSOR) MISC USE AS DIRECTED TO CHECK GLUCOSE DAILY. CHANGE EVERY 15 DAYS 05/24/23   Burchette, Wolm ORN, MD  Elastic Bandages & Supports (ABDOMINAL BINDER/ELASTIC MED) MISC 1 kit by Does not apply route daily. 06/09/23   Miriam Norris, NP  glucose blood test strip Check 1 time daily. E11.9 One Touch Ultra Blue Test Strips 03/10/14   Burchette, Wolm ORN, MD  Insulin  Pen Needle (BD PEN NEEDLE NANO U/F) 32G X 4 MM MISC USE 1 PEN NEEDLE SUBCUTANEOUSLY WITH INSULIN  4 TIMES DAILY 12/04/19   Burchette, Wolm ORN, MD  nitroGLYCERIN  (NITROSTAT ) 0.4 MG SL tablet Place 1 tablet (0.4 mg  total) under the tongue every 5 (five) minutes as needed for chest pain. 04/21/23   Burchette, Wolm ORN, MD     Critical care time: 40 minutes     JD Emilio DEVONNA [facility] 10/12/2023, 7:49 AM  Please see [email or web address] for pager details.  From 7A-7P if no response, please call [phone]. After hours, please call ELink [phone].

## 2023-10-13 ENCOUNTER — Inpatient Hospital Stay (HOSPITAL_COMMUNITY)

## 2023-10-13 DIAGNOSIS — Z515 Encounter for palliative care: Secondary | ICD-10-CM | POA: Diagnosis not present

## 2023-10-13 DIAGNOSIS — N186 End stage renal disease: Secondary | ICD-10-CM | POA: Diagnosis not present

## 2023-10-13 DIAGNOSIS — Z992 Dependence on renal dialysis: Secondary | ICD-10-CM | POA: Diagnosis not present

## 2023-10-13 DIAGNOSIS — R918 Other nonspecific abnormal finding of lung field: Secondary | ICD-10-CM

## 2023-10-13 DIAGNOSIS — I5023 Acute on chronic systolic (congestive) heart failure: Secondary | ICD-10-CM | POA: Diagnosis not present

## 2023-10-13 DIAGNOSIS — Z7189 Other specified counseling: Secondary | ICD-10-CM

## 2023-10-13 DIAGNOSIS — J90 Pleural effusion, not elsewhere classified: Secondary | ICD-10-CM | POA: Diagnosis not present

## 2023-10-13 DIAGNOSIS — E1165 Type 2 diabetes mellitus with hyperglycemia: Secondary | ICD-10-CM

## 2023-10-13 DIAGNOSIS — I469 Cardiac arrest, cause unspecified: Secondary | ICD-10-CM | POA: Diagnosis not present

## 2023-10-13 DIAGNOSIS — I4891 Unspecified atrial fibrillation: Secondary | ICD-10-CM

## 2023-10-13 DIAGNOSIS — J9601 Acute respiratory failure with hypoxia: Secondary | ICD-10-CM | POA: Diagnosis not present

## 2023-10-13 LAB — CBC
HCT: 31.4 % — ABNORMAL LOW (ref 39.0–52.0)
Hemoglobin: 10.6 g/dL — ABNORMAL LOW (ref 13.0–17.0)
MCH: 28.7 pg (ref 26.0–34.0)
MCHC: 33.8 g/dL (ref 30.0–36.0)
MCV: 85.1 fL (ref 80.0–100.0)
Platelets: 211 K/uL (ref 150–400)
RBC: 3.69 MIL/uL — ABNORMAL LOW (ref 4.22–5.81)
RDW: 15.1 % (ref 11.5–15.5)
WBC: 20.5 K/uL — ABNORMAL HIGH (ref 4.0–10.5)
nRBC: 0 % (ref 0.0–0.2)

## 2023-10-13 LAB — GLUCOSE, CAPILLARY
Glucose-Capillary: 109 mg/dL — ABNORMAL HIGH (ref 70–99)
Glucose-Capillary: 111 mg/dL — ABNORMAL HIGH (ref 70–99)
Glucose-Capillary: 113 mg/dL — ABNORMAL HIGH (ref 70–99)
Glucose-Capillary: 116 mg/dL — ABNORMAL HIGH (ref 70–99)
Glucose-Capillary: 120 mg/dL — ABNORMAL HIGH (ref 70–99)
Glucose-Capillary: 122 mg/dL — ABNORMAL HIGH (ref 70–99)
Glucose-Capillary: 122 mg/dL — ABNORMAL HIGH (ref 70–99)
Glucose-Capillary: 122 mg/dL — ABNORMAL HIGH (ref 70–99)
Glucose-Capillary: 129 mg/dL — ABNORMAL HIGH (ref 70–99)
Glucose-Capillary: 129 mg/dL — ABNORMAL HIGH (ref 70–99)
Glucose-Capillary: 131 mg/dL — ABNORMAL HIGH (ref 70–99)
Glucose-Capillary: 143 mg/dL — ABNORMAL HIGH (ref 70–99)
Glucose-Capillary: 147 mg/dL — ABNORMAL HIGH (ref 70–99)
Glucose-Capillary: 148 mg/dL — ABNORMAL HIGH (ref 70–99)
Glucose-Capillary: 152 mg/dL — ABNORMAL HIGH (ref 70–99)
Glucose-Capillary: 160 mg/dL — ABNORMAL HIGH (ref 70–99)
Glucose-Capillary: 164 mg/dL — ABNORMAL HIGH (ref 70–99)
Glucose-Capillary: 96 mg/dL (ref 70–99)

## 2023-10-13 LAB — RENAL FUNCTION PANEL
Albumin: 2.1 g/dL — ABNORMAL LOW (ref 3.5–5.0)
Albumin: 2.1 g/dL — ABNORMAL LOW (ref 3.5–5.0)
Anion gap: 14 (ref 5–15)
Anion gap: 15 (ref 5–15)
BUN: 32 mg/dL — ABNORMAL HIGH (ref 8–23)
BUN: 28 mg/dL — ABNORMAL HIGH (ref 8–23)
CO2: 20 mmol/L — ABNORMAL LOW (ref 22–32)
CO2: 19 mmol/L — ABNORMAL LOW (ref 22–32)
Calcium: 7.6 mg/dL — ABNORMAL LOW (ref 8.9–10.3)
Calcium: 7.3 mg/dL — ABNORMAL LOW (ref 8.9–10.3)
Chloride: 103 mmol/L (ref 98–111)
Chloride: 101 mmol/L (ref 98–111)
Creatinine, Ser: 5.02 mg/dL — ABNORMAL HIGH (ref 0.61–1.24)
Creatinine, Ser: 4.04 mg/dL — ABNORMAL HIGH (ref 0.61–1.24)
GFR, Estimated: 12 mL/min — ABNORMAL LOW (ref >60)
GFR, Estimated: 16 mL/min — ABNORMAL LOW (ref >60)
Glucose, Bld: 119 mg/dL — ABNORMAL HIGH (ref 70–99)
Glucose, Bld: 158 mg/dL — ABNORMAL HIGH (ref 70–99)
Phosphorus: 2.6 mg/dL (ref 2.5–4.6)
Phosphorus: 3.3 mg/dL (ref 2.5–4.6)
Potassium: 4.3 mmol/L (ref 3.5–5.1)
Potassium: 4.5 mmol/L (ref 3.5–5.1)
Sodium: 137 mmol/L (ref 135–145)
Sodium: 135 mmol/L (ref 135–145)

## 2023-10-13 LAB — HEPARIN LEVEL (UNFRACTIONATED): Heparin Unfractionated: >1.1 [IU]/mL — ABNORMAL HIGH (ref 0.30–0.70)

## 2023-10-13 LAB — CYTOLOGY - NON PAP

## 2023-10-13 LAB — MAGNESIUM: Magnesium: 2.8 mg/dL — ABNORMAL HIGH (ref 1.7–2.4)

## 2023-10-13 LAB — APTT: aPTT: 47 s — ABNORMAL HIGH (ref 24–36)

## 2023-10-13 MED ORDER — LIDOCAINE 1 % OPTIME INJ - NO CHARGE
10.0000 mL | Freq: Once | INTRAMUSCULAR | Status: AC
Start: 2023-10-13 — End: 2023-10-13
  Administered 2023-10-13: 10 mL via INTRADERMAL

## 2023-10-13 MED ORDER — PROSOURCE TF20 ENFIT COMPATIBL EN LIQD
60.0000 mL | Freq: Two times a day (BID) | ENTERAL | Status: DC
  Administered 2023-10-13 – 2023-10-19 (×12): 60 mL
  Filled 2023-10-13 (×12): qty 60

## 2023-10-13 MED ORDER — THIAMINE MONONITRATE 100 MG PO TABS
100.0000 mg | ORAL_TABLET | Freq: Every day | ORAL | Status: AC
  Administered 2023-10-13 – 2023-10-17 (×5): 100 mg
  Filled 2023-10-13 (×5): qty 1

## 2023-10-13 MED ORDER — OSMOLITE 1.5 CAL PO LIQD
1000.0000 mL | ORAL | Status: DC
  Administered 2023-10-13: 1000 mL

## 2023-10-13 MED ORDER — PROPOFOL 1000 MG/100ML IV EMUL
0.0000 ug/kg/min | INTRAVENOUS | Status: DC
  Administered 2023-10-13: 10 ug/kg/min via INTRAVENOUS
  Filled 2023-10-13 (×2): qty 100

## 2023-10-13 MED ORDER — POTASSIUM & SODIUM PHOSPHATES 280-160-250 MG PO PACK
2.0000 | PACK | Freq: Three times a day (TID) | ORAL | Status: AC
  Administered 2023-10-13 (×2): 2
  Filled 2023-10-13 (×2): qty 2

## 2023-10-13 NOTE — Procedures (Signed)
 I have reviewed the CRRT procedure and made adjustments as needed.  Myer Fret MD  [facility] 10/13/2023, 12:07 PM

## 2023-10-13 NOTE — Progress Notes (Signed)
 Brief Nutrition Support Note  Pt remains intubated on vent support and with CRRT running. Nephrology planning to change to pull 50-100 cc/hr.   OGT exchanged for cortrak tube this AM. New consult received to initiate enteral feeds. Pt still on pressor support x 2, but rates have declined over the last 24 hours. Will enter recommendations per previous RD assessment. For last full RD note, see assessment from 10/2. Will follow-up as planned.   Discussed pt with RD. Current plan is to go to IR this afternoon for biopsy of chest wall.   INTERVENTION:  Recommend the following: Initiate tube feeding via cortrak: recommend beginning at 25 ml/h and increase 10 ml every 12h until goal rate is reached Osmolite 1.5 at 55 ml/h (1320 ml per day) Prosource TF20 60 ml BID Provides 2160 kcal, 122 gm protein, 1005 ml free water daily Pt may be at refeeding risk given inadequate nutrition for 4 days  Monitor electrolyte labs Add thiamine 100 mg prior to EN initiation Continue RenaVit daily    Vernell Lukes, RD, LDN, CNSC Registered Dietitian II Please reach out via secure chat

## 2023-10-13 NOTE — Progress Notes (Addendum)
 Rounding Note   Patient Name: Anthony Bullock Date of Encounter: 10/13/2023  [facility] Cardiologist: Anthony Carrier, MD   Subjective Intubated.  Denies pain other that getting NG tube inserted.   Scheduled Meds:  arformoterol  15 mcg Nebulization Q12H   atorvastatin   80 mg Per Tube QHS   budesonide (PULMICORT) nebulizer solution  0.5 mg Nebulization BID   Chlorhexidine  Gluconate Cloth  6 each Topical Q0600   clopidogrel   75 mg Per Tube Q breakfast   docusate  100 mg Per Tube BID   famotidine   20 mg Per Tube Daily   hydrocortisone  sod succinate (SOLU-CORTEF ) inj  100 mg Intravenous Q12H   midodrine   10 mg Per Tube Q8H   multivitamin  1 tablet Per Tube Daily   mouth rinse  15 mL Mouth Rinse Q2H   polyethylene glycol  17 g Per Tube Daily   revefenacin  175 mcg Nebulization Daily   sodium chloride  flush  10 mL Intrapleural Q8H   sodium chloride  flush  3 mL Intravenous Q12H   Continuous Infusions:  doxycycline  (VIBRAMYCIN ) IV 125 mL/hr at 10/13/23 1100   epinephrine  Stopped (10/12/23 1058)   fentaNYL  infusion INTRAVENOUS 300 mcg/hr (10/13/23 1100)   insulin  0.3 Units/hr (10/13/23 1100)   norepinephrine  (LEVOPHED ) Adult infusion 20 mcg/min (10/13/23 1100)   piperacillin -tazobactam (ZOSYN )  IV Stopped (10/13/23 0634)   prismasol BGK 4/2.5 1,400 mL/hr at 10/13/23 0438   prismasol BGK 4/2.5 400 mL/hr at 10/12/23 2250   prismasol BGK 4/2.5 400 mL/hr at 10/12/23 2300   vasopressin  0.04 Units/min (10/13/23 1100)   PRN Meds: acetaminophen  **OR** acetaminophen , dextrose , fentaNYL , heparin , heparin , ipratropium-albuterol , ondansetron  (ZOFRAN ) IV, mouth rinse   Vital Signs  Vitals:   10/13/23 0812 10/13/23 0816 10/13/23 0900 10/13/23 1000  BP:   (!) 145/112 (!) 117/106  Pulse:   (!) 52 (!) 54  Resp:   (!) 24 16  Temp:      TempSrc:      SpO2: 100% 100% 100% 99%  Weight:      Height:        Intake/Output Summary (Last 24 hours) at 10/13/2023 1128 Last data filed  at 10/13/2023 1100 Gross per 24 hour  Intake 2314.67 ml  Output 1397.8 ml  Net 916.87 ml      10/10/2023    7:15 PM 10/10/2023    5:34 PM 10/07/2023   12:30 PM  Last 3 Weights  Weight (lbs) 180 lb 5.4 oz 180 lb 8.9 oz 186 lb 1.1 oz  Weight (kg) 81.8 kg 81.9 kg 84.4 kg      Telemetry Sinus rhythm.  - Personally Reviewed  ECG  Sinus bradycarida.  1st degree AV block.   - Personally Reviewed  Physical Exam  VS:  BP (!) 117/106   Pulse (!) 54   Temp 98.8 F (37.1 C) (Axillary)   Resp 16   Ht 5' 10 (1.778 m)   Wt 81.8 kg   SpO2 99%   BMI 25.88 kg/m  , BMI Body mass index is 25.88 kg/m. GENERAL:  Critically ill-appearing.  Intubated and sedated.  HEENT: Pupils equal round and reactive, fundi not visualized, oral mucosa unremarkable NECK:  No jugular venous distention, waveform within normal limits, carotid upstroke brisk and symmetric, no bruits, no thyromegaly LUNGS:  Clear to auscultation bilaterally HEART:  RRR.  PMI not displaced or sustained,S1 and S2 within normal limits, no S3, no S4, no clicks, no rubs, no murmurs ABD:  Flat, positive bowel  sounds normal in frequency in pitch, no bruits, no rebound, no guarding, no midline pulsatile mass, no hepatomegaly, no splenomegaly EXT:  2 plus pulses throughout, no edema, no cyanosis no clubbing SKIN:  No rashes no nodules NEURO:  Nods head to questions.   PSYCH:  Unable to assess  Labs High Sensitivity Troponin:   Recent Labs  Lab 10/06/23 1733 10/11/23 1258 10/11/23 1635 10/12/23 0823 10/12/23 1029  TROPONINIHS 155* 897* 1,127* 4,864* 5,169*     Chemistry Recent Labs  Lab 10/06/23 1400 10/07/23 0523 10/11/23 0620 10/11/23 0931 10/11/23 0949 10/11/23 1635 10/12/23 0502 10/12/23 1609 10/13/23 0516  NA 133*   < > 131*   < > 131*   < > 132* 135 137  K 3.4*   < > 4.5   < > 4.8   < > 4.7 4.1 4.3  CL 91*   < > 95*  --  92*   < > 93* 101 103  CO2 25   < > 19*  --  16*   < > 17* 19* 20*  GLUCOSE 237*   < > 184*   --  284*   < > 403* 105* 119*  BUN 30*   < > 53*  --  58*   < > 69* 51* 32*  CREATININE 8.08*   < > 10.18*  --  10.48*   < > 11.17* 7.59* 5.02*  CALCIUM  9.5   < > 7.8*  --  7.6*   < > 7.1* 7.2* 7.6*  MG  --   --  1.9  --  3.5*  --   --   --  2.8*  PROT 7.1  --  6.5  --  6.5  --   --   --   --   ALBUMIN  2.7*   < > 1.9*  --  2.0*   < > 2.4* 2.2* 2.1*  AST 24  --  35  --  87*  --   --   --   --   ALT 17  --  25  --  52*  --   --   --   --   ALKPHOS 101  --  114  --  113  --   --   --   --   BILITOT 0.5  --  1.0  --  1.6*  --   --   --   --   GFRNONAA 7*   < > 5*  --  5*   < > 5* 7* 12*  ANIONGAP 17*   < > 17*  --  23*   < > 22* 15 14   < > = values in this interval not displayed.    Lipids No results for input(s): CHOL, TRIG, HDL, LABVLDL, LDLCALC, CHOLHDL in the last 168 hours.  Hematology Recent Labs  Lab 10/11/23 0949 10/11/23 1325 10/12/23 0036 10/12/23 0502 10/12/23 1029 10/13/23 0516  WBC 31.8*  --   --  31.5*  --  20.5*  RBC 4.41  --   --  4.10*  --  3.69*  HGB 12.8*   < > 11.6* 11.7*  --  10.6*  HCT 38.4*   < > 34.6* 35.1*  --  31.4*  MCV 87.1  --   --  85.6  --  85.1  MCH 29.0  --   --  28.5  --  28.7  MCHC 33.3  --   --  33.3  --  33.8  RDW 15.2  --   --  15.1  --  15.1  PLT 470*  --   --  486* 411* 211   < > = values in this interval not displayed.   Thyroid  No results for input(s): TSH, FREET4 in the last 168 hours.  BNP Recent Labs  Lab 10/06/23 1359  BNP 526.2*    DDimer  Recent Labs  Lab 10/12/23 1029  DDIMER 2.11*     Radiology  DG Abd 1 View Result Date: 10/13/2023 CLINICAL DATA:  Abdominal distension. EXAM: ABDOMEN - 1 VIEW COMPARISON:  10/11/2023 FINDINGS: Nasogastric tube continues to extend into the stomach. Pattern colonic ileus appears stable to mildly improved. No small bowel dilatation. No abnormal calcifications. IMPRESSION: Stable to mildly improved colonic ileus. Electronically Signed   By: Marcey Moan M.D.   On:  10/13/2023 10:14   DG CHEST PORT 1 VIEW Result Date: 10/12/2023 CLINICAL DATA:  Pleural effusion. EXAM: PORTABLE CHEST 1 VIEW COMPARISON:  October 11, 2023. FINDINGS: Stable cardiomediastinal silhouette. Endotracheal and nasogastric tubes are in grossly good position. Bilateral internal jugular catheters are unchanged. Defibrillator is unchanged. Stable right pleural effusion is noted with associated right basilar atelectasis or infiltrate. Pigtail catheter in right lung base is unchanged. IMPRESSION: Stable support apparatus. Stable right pleural effusion with associated right basilar atelectasis or infiltrate. Electronically Signed   By: Lynwood Landy Raddle M.D.   On: 10/12/2023 08:25   CT HEAD WO CONTRAST ( ) Result Date: 10/11/2023 CLINICAL DATA:  08/05/2022 EXAM: CT HEAD WITHOUT CONTRAST TECHNIQUE: Contiguous axial images were obtained from the base of the skull through the vertex without intravenous contrast. RADIATION DOSE REDUCTION: This exam was performed according to the departmental dose-optimization program which includes automated exposure control, adjustment of the mA and/or kV according to patient size and/or use of iterative reconstruction technique. COMPARISON:  08/05/2022 FINDINGS: Brain: No evidence of acute infarct or hemorrhage. The lateral ventricles and midline structures are unremarkable. No acute extra-axial fluid collections. No mass effect. Vascular: There is residual intravascular contrast from preceding CT chest. Given time since prior chest CT this may reflect an element of underlying renal insufficiency. Diffuse atherosclerosis of the internal carotid arteries. Skull: Normal. Negative for fracture or focal lesion. Sinuses/Orbits: Endotracheal and enteric catheter are identified within the oropharynx. Mucosal thickening within the bilateral maxillary sinuses, with evidence of prior medial wall antrectomies. Other: None. IMPRESSION: 1. No acute intracranial process. Electronically  Signed   By: Ozell Daring M.D.   On: 10/11/2023 15:21   DG Abd Portable 1V Result Date: 10/11/2023 CLINICAL DATA:  Orogastric tube placement. EXAM: PORTABLE ABDOMEN - 1 VIEW COMPARISON:  None Available. FINDINGS: Orogastric tube extends into the stomach. Visualized bowel gas demonstrates mild gaseous distension of the colon likely reflective of ileus. Right lateral basilar pleural thoracostomy tube present. IMPRESSION: Orogastric tube extends into the stomach.  Probable colonic ileus. Electronically Signed   By: Marcey Moan M.D.   On: 10/11/2023 12:38    Cardiac Studies Echo 10/2023:    1. No left ventricular thrombus is seen (Definity  contrast was used).  Left ventricular ejection fraction, by estimation, is 35 to 40%. The left  ventricle has moderately decreased function. The left ventricle  demonstrates global hypokinesis. Left  ventricular diastolic parameters are consistent with Grade I diastolic  dysfunction (impaired relaxation).   2. Right ventricular systolic function is normal. The right ventricular  size is normal. Tricuspid regurgitation signal is inadequate for assessing  PA pressure.   3.  The mitral valve is normal in structure. No evidence of mitral valve  regurgitation. No evidence of mitral stenosis.   4. The aortic valve is tricuspid. Aortic valve regurgitation is not  visualized. Aortic valve sclerosis is present, with no evidence of aortic  valve stenosis.   5. The inferior vena cava is normal in size with greater than 50%  respiratory variability, suggesting right atrial pressure of 3 mmHg.   Patient Profile   Mr. Okray is a 69M with ESRD on HD, HFimpEF, PAF, PAD s/p bilateral BKA, GIB, and hypotension on chronic midodrine  admitted with acute respiratory failure and pleural effusion. Cardiology consulted for cardiac arrest that occurred at the time of chest tube insertion.   Assessment & Plan  # Cardiac arrest:  Episode occurred in the setting of shortness  of breath and hypoxia.  He subsequently developed VT.  It seems this was primarily respiratory.  OK to start amiodarone  IV if his VT recurs.     # HFrEF: # Cardiogenic shock: LVEF previously improved but is now reduced again.  He isn't a candidate for cath due to acute illness.  Not a good candidate for repeat PCI given issues with GIB.  We are unable to do much regarding medical management given hypotension.  Levo requirement decreasing.  He remains on low dose Levo, vasopressin  and midodrine .  Volume managed with CRRT.    # Likely malignancy:  CT revealed 1.9cm nodule and bony changes in the ribs due to a chest wall mass.  I had a long discussion with Mrs. Bullock about how ill her husband is and the uphill battle he is facing.  She is putting some thought into this and what his wishes would be.  She reports that he would not want to be intubed >2 weeks. He remains hypotensive on multiple pressors but more alert today.    # PAF: Amiodarone , carvedilol  and Eliquis  currently on hold.  Low threshold to resume amiodarone  for VT or atrial fibrillation as needed.   # CAD:  # PAD: # Hyperlipidemia: S/p DES to LAD, D1 and RCA.  Asprin has been held.  Continue clopidgorel and atorvastatin .    Total critical care time: 45 minutes. Critical care time was exclusive of separately billable procedures and treating other patients. Critical care was necessary to treat or prevent imminent or life-threatening deterioration. Critical care was time spent personally by me on the following activities: development of treatment plan with patient and/or surrogate as well as nursing, discussions with consultants, evaluation of patient's response to treatment, examination of patient, obtaining history from patient or surrogate, ordering and performing treatments and interventions, ordering and review of laboratory studies, ordering and review of radiographic studies, pulse oximetry and re-evaluation of patient's condition.      For questions or updates, please contact [facility] Please consult [email or web address] for contact info under       Signed, Annabella Scarce, MD  10/13/2023, 11:28 AM

## 2023-10-13 NOTE — Consult Note (Signed)
 Consultation Note Date: 10/13/2023   Patient Name: Anthony Bullock  DOB: Jan 07, 1961  MRN: [id number]  Age / Sex: 63 y.o., male  PCP: Micheal Wolm LELON, MD Referring Physician: Harold Scholz, MD  Reason for Consultation: Establishing goals of care  HPI/Patient Profile: 63 y.o. male  with past medical history of HTN, HLD, chronic systolic CHF, paroxysmal atrial fibrillation, CAD ESRD on HD (TTS), DM type II, COPD, PVD s/p bilateral BKA, chronic anemia, and GI bleed  admitted on 10/06/2023 with shortness of breath and right-sided chest pain. Chext xray revealed large right-sided pleural effusion.   During this hospital stay, he underwent an IR-guided right thoracentesis with 1.85 mL removed; no fluid was sent for analysis. A subsequent thoracentesis on 9/28 yielded 520 mL of fluid, which was sent for pleural studies. On 10/1, the patient was transferred to the ICU due to hypotension and respiratory distress, requiring initiation of vasopressors. The clinical course was complicated by a brief cardiac arrest with ROSC after 10 minutes, and the patient was subsequently intubated.  Patient has had 4 hospital admissions over the last 6 months.   PMT has been consulted to assist with goals of care conversation. Patient/Family face treatment option decisions, advanced directive decisions and anticipatory care needs.   Family face treatment option decision, advance directive decisions and anticipatory care needs.   Clinical Assessment and Goals of Care:  I have reviewed medical records including EPIC notes, labs and imaging, assessed the patient and then met with wife to discuss diagnosis prognosis, GOC, EOL wishes, disposition and options.  I introduced Palliative Medicine as specialized medical care for people living with serious illness. It focuses on providing relief from the symptoms and stress of a serious illness. The goal is to improve quality of life for both the  patient and the family.  The patient was assessed at the bedside without family present. He appeared chronically and critically ill, requiring ventilator support. At the time of evaluation, a propofol  infusion had recently been initiated, resulting in sedation and lack of responsiveness to verbal stimuli.  I later met with the patient's wife, Devere, at the bedside to discuss his current medical condition and ongoing care plan. She demonstrated a clear understanding of his acute illness, underlying chronic health issues, and the reasons for his hospitalization. She acknowledged the severity of his condition, noting that he is receiving aggressive treatment for acute respiratory failure and is currently on full ventilator support. We reviewed the seriousness of his illness, the frequency of prior hospitalizations, and the overall disease burden, including its impact on the potential for meaningful recovery and future quality of life. We also discussed the patient's concern regarding a possible malignancy associated with the right chest wall mass. A CT-guided biopsy is scheduled for today to aid in diagnosis. I provided a detailed explanation of the current interventions (CRRT, pressors, mechanical vent support etc) and the rationale behind each component of the treatment plan.  We revisited goals of care in light of the patient's current clinical status. 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 She wishes to continue the current treatment plan in hopes of clinical improvement and prefers to wait for the biopsy results before making further medical decisions, including considerations around extubation. At this time, she desires for the patient to remain full code. I  explained that the patient's disease burden is  significant and that recovery will be challenging. She verbalized understanding.  We reviewed code status and discussed the risks and potential complications of resuscitation in the context of his current condition. Devere became emotional, expressing the difficulty of the situation. While she understands the possible outcomes, she wishes for him to remain full code for now. I reassured her that this is acceptable and emphasized that decisions do not need to be made immediately, encouraging ongoing dialogue as the situation evolves.  Got a report from bedside RN. She shared that patient is scheduled to undergo percutaneous biopsy this afternoon due to a chest wall mass. Reportedly patient responds to external stimuli, responds to voice and follow commands.   Created space and opportunity for family to explore thoughts and feelings regarding patient's current medical condition.   The difference between aggressive medical intervention and comfort care was considered in light of the patient's goals of care. Hospice and Palliative Care services outpatient were explained and offered.   Discussed the importance of continued conversation with family and the medical providers regarding overall plan of care and treatment options, ensuring decisions are within the context of the patient's values and GOCs.   Questions and concerns were addressed.  Hard Choices booklet left for review. The family was encouraged to call with questions or concerns.  PMT will continue to support holistically.   Social History: The patient resides with his wife and mother-in-law. He has been married for 31 years. His wife describes him as resilient and determined, noting that he enjoys watching television and taking care of their yard, particularly mowing the lawn.  Functional and Nutritional State: The patient is a bilateral lower extremity amputee and ambulates using prosthetic limbs. His wife reports an increase in falls over  the past several weeks prior to admission. He has been receiving dialysis treatment for the past eight years. In recent weeks to months, his wife has also observed a gradual onset of shortness of breath.  Palliative Symptoms: Currently in ICU, on mechanical vent support.   Advance Directives: Patient does not have advance directive documents. Wife mentioned that they have talk about executing one, but has not had the chance to do one.    Code Status: Full code   Primary Decision Maker: Wife, Jamail Cullers    SUMMARY OF RECOMMENDATIONS   Code Status: Maintain Full code Continue current scope of care Goal of care is medical stabilization and recovery to the extent this is possible, allow time for outcomes Discussed the importance of continued conversation with family and the medical providers regarding overall plan of care and treatment options, ensuring decisions are within the context of the patient's values and GOCs.  Continue to provide psycho-social and emotional support to patient and family Palliative medicine team will continue to follow.   Symptom Management: Per ICU team Palliative medicine is available to assist as needed.    Palliative Prophylaxis:  Aspiration, Bowel Regimen, Delirium Protocol, Eye Care, Frequent Pain Assessment, Oral Care, and Turn Reposition   Psycho-social/Spiritual:  Desire for further Chaplaincy support:no  Prognosis: Prognosis is guarded due to the patient's cumulative disease burden. The pattern of recurrent hospitalizations and persistent decline in health status underscores a high risk for further decompensation and poor overall outcomes.   Discharge Planning: To Be Determined      Primary Diagnoses: Present on Admission:  Pleural effusion  Acute respiratory distress  Paroxysmal atrial fibrillation (HCC)  Acute on chronic HFrEF (heart  failure with reduced ejection fraction) (HCC)  Type 2 diabetes mellitus with hyperlipidemia (HCC)   Chest pain  CAD (coronary artery disease)  Hypotension  PAD (peripheral artery disease)  Hypoxemia   Physical Exam Vitals and nursing note reviewed.  Constitutional:      Appearance: He is toxic-appearing.  HENT:     Head: Normocephalic and atraumatic.  Cardiovascular:     Rate and Rhythm: Bradycardia present.  Pulmonary:     Comments: On mechanical vent, synchronous with vent.  Abdominal:     Palpations: Abdomen is soft.  Genitourinary:    Comments: Foley in place Musculoskeletal:     Comments: Bedbound, bilateral BKA   Neurological:     Comments: Sedated     Vital Signs: BP 90/69   Pulse (!) 58   Temp 99.3 F (37.4 C) (Axillary)   Resp (!) 24   Ht 5' 10 (1.778 m)   Wt 81.8 kg   SpO2 98%   BMI 25.88 kg/m  Pain Scale: CPOT POSS *See Group Information*: 1-Acceptable,Awake and alert Pain Score: 0-No pain   SpO2: SpO2: 98 % O2 Device:SpO2: 98 % O2 Flow Rate: .O2 Flow Rate (L/min): 3 L/min   Palliative Assessment/Data: 30%    Total time: I spent 90 minutes in the care of the patient today in the above activities and documenting the encounter.   Detailed review of medical records (labs, imaging, vital signs), medically appropriate exam, discussed with treatment team, counseling and education to patient, family, & staff, documenting clinical information, coordination of care.     Kathlyne JULIANNA Tracie Mickey, NP  Palliative Medicine Team Team phone # [phone]  Thank you for allowing the Palliative Medicine Team to assist in the care of this patient. Please utilize secure chat with additional questions, if there is no response within 30 minutes please call the above phone number.  Palliative Medicine Team providers are available by phone from 7am to 7pm daily and can be reached through the team cell phone.  Should this patient require assistance outside of these hours, please call the patient's attending physician.

## 2023-10-13 NOTE — Procedures (Signed)
 Interventional Radiology Procedure:   Indications: Right chest wall mass  Procedure: CT guided right chest wall biopsy  Findings: Soft tissue lesion / mass involving right chest wall  Complications: None     EBL: Minimal  Plan: Return to inpatient floor  Amariah Kierstead R. Philip, MD  Pager: [phone]

## 2023-10-13 NOTE — Progress Notes (Signed)
 og  NAME:  Anthony Bullock, MRN:  [id number], DOB:  1960-07-02, LOS: 6 ADMISSION DATE:  10/06/2023, CONSULTATION DATE:  10/09/23 REFERRING MD:  Dr. Trixie, CHIEF COMPLAINT:  SOB    History of Present Illness:   58 yoM with PMH significant for COPD, ESRD TTS HD, HTN, HLD, HFrEF EF 40, HFpEF, PAF on Eliquis , CAD on plavix , DMT2, PVD s/p bilateral BKA, anemia, GIB who presented 9/26 with one month hx of SOB and intermittent right sided chest pain radiating into R shoulder.  No cough or fever. CXR performed while at HD 9/25 which showed right pleural effusion, recommended further evaluation and sent to ER.   Former smoker, quit 13 yrs ago.  Has 64 pack yr history.  Pt is not always forth coming with specific information, wife at bedside is helpful.  Pt reports no recent fever or chills, N/V.  Has had sinus congestion with yellow drainage and tint of blood for several days and as of yesterday having intermittent yellowish productive cough.  Does report exertional dyspnea and intermittent pleuritic pain with inspiration and R shoulder pain for at least 4 weeks.  Poor appetite over last week and reports weight loss, ?20lbs but on chart review, is down ~6kg since May.  Also, wife reports pt had fall with resultant right sided chest pain after hospitalization in May for pulmonary edema and April for GIB and s/p PCI to LAD/ RCA 04/12/23; pt did not seek evaluation after fall.  Also had fall last week after getting off lawn mower after prolonged use, denies hitting head. Wife reports it is not uncommon for him to fall due to weakness after HD and ongoing use of BLE prosthetics. On imaging review, pt has had moderate right pleural effusion since CXR in May 2025.  On admit, required 4L Page.  Afebrile and normal WBC/ differential. LFTs normal. Admitted to [facility].  Underwent IR thoracentesis on 9/26 with 1.85L of amber colored pleural fluid however was not sent for studies.  CXR 9/28 showed increasing R pleural effusion, tracking  laterally and underwent IR thoracentesis with of amber fluid which was sent for cell count and cytology.  Pt did report improvement in SOB after thoracentesis's and weaned to room air 9/28.  Chest CT w/ contrast performed after thoracentesis which showed extensive pleural nodularity in right chest, posterior right pleural nodule measures 4x1, with plaque-like nodular pleural thickening inferolaterally extending into right chest wall with pathologic fracture of anterior 5th and destruction of 6/7 ribs suspicious for malignancy, 17mm GGO in LUL, 5mm LLL pulmonary nodule, 13 mm paraesophageal lymph node, and RUL an RML volume loss with collapse/ consolidation in RLL with areas of loculated pleural fluid in right upper hemithorax.  Pleural fluid noted straw colored, LDH 660, neutrophil predominant, glucose 160, protein 5.5.  Pulmonary consulted for further recommendations.   On 10/1, Patient became hypotensive requiring Levophed .  Increasing WBC. Patient without complaints. Patient being followed by PCCM for pulmonary. Labs and scans reviewed-CT chest with contrast 9/28- not a PE protocol study, nodularity suggestive of possible malignancy, loculated large right pleural effusion.  Pertinent  Medical History  Former smoker, COPD, ESRD TTS HD, hypotension on midodrine , HTN, HLD, HFrEF, PAF, CAD, DMT2, peripheral neuropathy, PVD s/p bilateral BKA, anemia, GIB  Significant Hospital Events: Including procedures, antibiotic start and stop dates in addition to other pertinent events   9/26 admit, IR R thora 1.85 ml- no fluid sent 9/28 IR thora 520 ml, pleural studies sent 10/1 transferred to icu  with hypotension started on pressors and respiratory distress; brief cardiac arrest rosc 10 minutes; intubated  Interim History / Subjective:  +1.2 L I/O, afebrile. Remains ventilated. CRRT overnight with 700 UF.  Remains on levo, vaso-low doses 424 ml out from chest tube Objective    Blood pressure (!)  133/117, pulse (!) 52, temperature 98.5 F (36.9 C), temperature source Axillary, resp. rate (!) 24, height 5' 10 (1.778 m), weight 81.8 kg, SpO2 100%. CVP:  [11 mmHg-49 mmHg] 49 mmHg  Vent Mode: PRVC FiO2 (%):  [35 %-50 %] 35 % Set Rate:  [24 bmp] 24 bmp Vt Set:  [580 mL] 580 mL PEEP:  [8 cmH20] 8 cmH20 Plateau Pressure:  [24 cmH20] 24 cmH20   Intake/Output Summary (Last 24 hours) at 10/13/2023 0753 Last data filed at 10/13/2023 0700 Gross per 24 hour  Intake 2426.04 ml  Output 1130.8 ml  Net 1295.24 ml   Filed Weights   10/07/23 1230 10/10/23 1734 10/10/23 1915  Weight: 84.4 kg 81.9 kg 81.8 kg   Examination: General:  critically ill appearing on mech vent HEENT: MM pink/moist; ETT in place Neuro: sedated on fentanyl ; opens eyes to voice and follows commands CV: s1s2, RRR, no m/r/g PULM:  more dim bs on right; synchronous with ventilator-minimal secretions via ETT GI: distended, bsx4 active  Extremities: warm/dry, b/l bka Skin: no rashes or lesions   Resolved problem list   Assessment and Plan   Cardiac arrest: likely respiratory related given hypoxia and resp distress; went into vtac and pulseless; then rhythm showing coarse vfib when pads placed requiring shock. Rosc 10 minutes Shock: presumed septic -Blood cx NGTD, Pleural fluid cx NGTD, resp Cx with rare pseudmonas Plan: -continue pressors levo, epi and vaso for SBP goal >110 -Midodrine  10 mg tid -stress dose steroids -cvp trending; crrt for volume removal -cont zosyn  and doxy D3  Acute respiratory failure w/ hypoxia Right loculated pleural effusion RLL consolidation vs atelectasis with extensive right pleural nodularity with suspected pathologic right rib anterior rib fx 5/6/ 7, LUL GGO and LLL pleural nodule Possible PNA, respiratory culture with rare pseudomonas Possible lung malignancy Former smoker Hx of COPD? Right effusion dating back to May 2025 after admit for pulmonary edema/ hypervolemia ddx  concerning for malignancy, possible infectious vs inflammatory etiology.   - Pleural fluid cytology 9/28 negative and 10/1 pending. Plan: -cxr this am w/ persistent small-moderate loculated effusion; continue chest tube to suction - may ultimately need lytic.  -Continue full vent support with lung protective strategies and wean PEEP/FiO2 for SpO2 > 92% -VAP bundle in place -Daily SAT and SBT  -PAD protocol in place -wean sedation for RASS goal 0 to -1 -IR consulted for perc biopsy to rule out malignancy when stable if repeat cytology on 10/1 negative-plan for biopsy today -triple therapy nebs; prn duoneb  ESRD on HD Hyponatremia Plan: -nephro following; -CRRT for volume removal, CVP remains elevated -Avoid nephrotoxic agents, ensure adequate renal perfusion  PAF Plan: -po amio currently on hold; currently in sinus -Heparin  held yesterday - unclear.  No a/c with plans for biopsy today and will re-eval after biopsy completed.  Chronic systolic chf HLD CAD w DES to LAD, D1, RCA -Repeat 2D echo 10/1 shows LVEF 35%-40%, normal RV, stable from previous echo's. -Cardiology following Plan: -CRRT for volume removal -statin & plavix   T2DM Plan: -on insulin  drip currently; will continue with starting tube feeds today and transition to SSI tomorrow once requirements known  Abdominal distention -Last stool 9/30  recorded.  KUB today  Best Practice (right click and Reselect all SmartList Selections daily)   Diet/type: tubefeeds- no nutrition - place coretrak and start tube feeds after biopsy DVT prophylaxis: SCD GI prophylaxis: H2B Lines: Central line, Dialysis Catheter, and Arterial Line Foley:  N/A Code Status:  full code Last date of multidisciplinary goals of care discussion [10/1 updated wife at bedside. Going to discuss with family to discuss dnr. States he would not want to be on vent long term.]   Labs   CBC: Recent Labs  Lab 10/06/23 1359 10/07/23 0523  10/10/23 1626 10/11/23 0620 10/11/23 0931 10/11/23 0949 10/11/23 1325 10/11/23 2024 10/12/23 0036 10/12/23 0502 10/12/23 1029 10/13/23 0516  WBC 9.9   < > 21.4* 27.1*  --  31.8*  --   --   --  31.5*  --  20.5*  NEUTROABS 7.0  --   --  23.0*  --   --   --   --   --   --   --   --   HGB 13.6   < > 12.2* 13.7   < > 12.8* 12.1* 11.7* 11.6* 11.7*  --  10.6*  HCT 42.3   < > 36.6* 41.9   < > 38.4* 36.6* 34.4* 34.6* 35.1*  --  31.4*  MCV 88.3   < > 85.7 86.9  --  87.1  --   --   --  85.6  --  85.1  PLT 240   < > 304 474*  --  470*  --   --   --  486* 411* 211   < > = values in this interval not displayed.    Basic Metabolic Panel: Recent Labs  Lab 10/10/23 0206 10/11/23 0620 10/11/23 0931 10/11/23 0949 10/11/23 1635 10/12/23 0502 10/12/23 1609 10/13/23 0516  NA 134* 131*   < > 131* 134* 132* 135 137  K 3.9 4.5   < > 4.8 4.5 4.7 4.1 4.3  CL 96* 95*  --  92* 92* 93* 101 103  CO2 21* 19*  --  16* 16* 17* 19* 20*  GLUCOSE 172* 184*  --  284* 379* 403* 105* 119*  BUN 62* 53*  --  58* 61* 69* 51* 32*  CREATININE 12.07* 10.18*  --  10.48* 10.59* 11.17* 7.59* 5.02*  CALCIUM  8.4* 7.8*  --  7.6* 7.4* 7.1* 7.2* 7.6*  MG  --  1.9  --  3.5*  --   --   --  2.8*  PHOS 5.2*  --   --   --  6.7* 6.9* 3.7 2.6   < > = values in this interval not displayed.   GFR: Estimated Creatinine Clearance: 15.8 mL/min (A) (by C-G formula based on SCr of 5.02 mg/dL (H)). Recent Labs  Lab 10/10/23 0206 10/10/23 1626 10/11/23 0620 10/11/23 0940 10/11/23 0949 10/12/23 0502 10/12/23 0823 10/13/23 0516  PROCALCITON 2.96  --   --   --   --   --   --   --   WBC 19.7*   < > 27.1*  --  31.8* 31.5*  --  20.5*  LATICACIDVEN  --   --  1.8 3.1*  --   --  2.0*  --    < > = values in this interval not displayed.    Liver Function Tests: Recent Labs  Lab 10/06/23 1400 10/07/23 0523 10/11/23 0620 10/11/23 0949 10/11/23 1635 10/12/23 0502 10/12/23 1609 10/13/23 0516  AST 24  --  35 87*  --   --   --    --   ALT 17  --  25 52*  --   --   --   --   ALKPHOS 101  --  114 113  --   --   --   --   BILITOT 0.5  --  1.0 1.6*  --   --   --   --   PROT 7.1  --  6.5 6.5  --   --   --   --   ALBUMIN  2.7*   < > 1.9* 2.0* 2.8* 2.4* 2.2* 2.1*   < > = values in this interval not displayed.   Recent Labs  Lab 10/06/23 1400  LIPASE 25   No results for input(s): AMMONIA in the last 168 hours.  ABG    Component Value Date/Time   PHART 7.319 (L) 10/11/2023 0931   PCO2ART 34.4 10/11/2023 0931   PO2ART 123 (H) 10/11/2023 0931   HCO3 17.7 (L) 10/11/2023 0931   TCO2 19 (L) 10/11/2023 0931   ACIDBASEDEF 8.0 (H) 10/11/2023 0931   O2SAT 72.9 10/12/2023 0926     Coagulation Profile: Recent Labs  Lab 10/12/23 1029  INR 2.7*    Cardiac Enzymes: No results for input(s): CKTOTAL, CKMB, CKMBINDEX, TROPONINI in the last 168 hours.  HbA1C: Hemoglobin A1C  Date/Time Value Ref Range Status  09/20/2023 02:10 PM 6.7 (A) 4.0 - 5.6 % Final  06/19/2023 01:57 PM 5.8 (A) 4.0 - 5.6 % Final  02/15/2018 12:00 AM 11.6  Final    Comment:    [facility] in [place]   Hgb A1c MFr Bld  Date/Time Value Ref Range Status  11/13/2019 04:43 PM 6.5 (H) 4.8 - 5.6 % Final    Comment:    (NOTE) Pre diabetes:          5.7%-6.4%  Diabetes:              >6.4%  Glycemic control for   <7.0% adults with diabetes   07/10/2019 04:44 AM 8.1 (H) 4.8 - 5.6 % Final    Comment:    (NOTE)         Prediabetes: 5.7 - 6.4         Diabetes: >6.4         Glycemic control for adults with diabetes: <7.0     CBG: Recent Labs  Lab 10/13/23 0309 10/13/23 0413 10/13/23 0515 10/13/23 0614 10/13/23 0700  GLUCAP 122* 120* 96 131* 122*    Review of Systems:   Patient is sedate and/or intubated; therefore, history has been obtained from chart review.     Past Medical History:  He,  has a past medical history of AICD (automatic cardioverter/defibrillator) present, Allergic rhinitis, Anemia, Arthritis, Chronic  systolic heart failure (HCC), COPD (chronic obstructive pulmonary disease) (HCC), Diabetes mellitus type II, Diabetic nephropathy (HCC), Diabetic neuropathy (HCC), ESRD on hemodialysis (HCC), History of cardiac catheterization, History of kidney stones, Hyperlipidemia, Hypertension, Kidney stones, NICM (nonischemic cardiomyopathy) (HCC), Osteomyelitis (HCC), Pneumonia, Urethral stricture, and Wears glasses.   Surgical History:   Past Surgical History:  Procedure Laterality Date   ABDOMINAL AORTOGRAM W/LOWER EXTREMITY N/A 03/30/2016   Procedure: Abdominal Aortogram w/Lower Extremity;  Surgeon: Lonni GORMAN Blade, MD;  Location: [facility] INVASIVE CV LAB;  Service: Cardiovascular;  Laterality: N/A;   AMPUTATION Right 04/26/2016   Procedure: Right Below Knee Amputation;  Surgeon: Jerona Harden GAILS, MD;  Location: [facility] OR;  Service: Orthopedics;  Laterality:  Right;   AMPUTATION Left 08/21/2019   Procedure: LEFT FOOT 5TH RAY AMPUTATION;  Surgeon: Harden Jerona GAILS, MD;  Location: [facility] OR;  Service: Orthopedics;  Laterality: Left;   AMPUTATION Left 11/13/2019   Procedure: LEFT BELOW KNEE AMPUTATION;  Surgeon: Harden Jerona GAILS, MD;  Location: [facility] OR;  Service: Orthopedics;  Laterality: Left;   AV FISTULA PLACEMENT Right 09/08/2015   Procedure: INSERTION OF 4-32mm x 45cm  ARTERIOVENOUS (AV) GORE-TEX GRAFT RIGHT UPPER  ARM;  Surgeon: Lonni GORMAN Blade, MD;  Location: [facility] OR;  Service: Vascular;  Laterality: Right;   AV FISTULA PLACEMENT Left 01/14/2016   Procedure: CREATION OF LEFT UPPER ARM ARTERIOVENOUS FISTULA;  Surgeon: Lonni GORMAN Blade, MD;  Location: [facility] OR;  Service: Vascular;  Laterality: Left;   BASCILIC VEIN TRANSPOSITION Right 08/22/2014   Procedure: RIGHT UPPER ARM BASCILIC VEIN TRANSPOSITION;  Surgeon: Lonni GORMAN Blade, MD;  Location: [facility] OR;  Service: Vascular;  Laterality: Right;   BELOW KNEE LEG AMPUTATION Right 04/26/2016   BIOPSY OF SKIN SUBCUTANEOUS TISSUE AND/OR MUCOUS MEMBRANE  04/28/2023    Procedure: BIOPSY, SKIN, SUBCUTANEOUS TISSUE, OR MUCOUS MEMBRANE;  Surgeon: Shila Gustav GAILS, MD;  Location: [facility] ENDOSCOPY;  Service: Gastroenterology;;   CARDIAC CATHETERIZATION     CARDIAC DEFIBRILLATOR PLACEMENT  06/27/2013   Sub Q       BY DR FERNANDE   CATARACT EXTRACTION W/PHACO Right 08/06/2018   Procedure: CATARACT EXTRACTION PHACO AND INTRAOCULAR LENS PLACEMENT (IOC);  Surgeon: Harrie Agent, MD;  Location: [facility] ORS;  Service: Ophthalmology;  Laterality: Right;  CDE: 4.06   CATARACT EXTRACTION W/PHACO Left 08/20/2018   Procedure: CATARACT EXTRACTION PHACO AND INTRAOCULAR LENS PLACEMENT (IOC);  Surgeon: Harrie Agent, MD;  Location: [facility] ORS;  Service: Ophthalmology;  Laterality: Left;  CDE: 6.76   COLONOSCOPY WITH PROPOFOL  N/A 07/22/2015   Procedure: COLONOSCOPY WITH PROPOFOL ;  Surgeon: Victory LITTIE Legrand DOUGLAS, MD;  Location: [facility] ENDOSCOPY;  Service: Gastroenterology;  Laterality: N/A;   CORONARY LITHOTRIPSY N/A 04/14/2023   Procedure: CORONARY LITHOTRIPSY;  Surgeon: Anner Alm ORN, MD;  Location: [facility] INVASIVE CV LAB;  Service: Cardiovascular;  Laterality: N/A;  RCA   CORONARY STENT INTERVENTION N/A 04/12/2023   Procedure: CORONARY STENT INTERVENTION;  Surgeon: Swaziland, Peter M, MD;  Location: [facility] INVASIVE CV LAB;  Service: Cardiovascular;  Laterality: N/A;   CORONARY STENT INTERVENTION N/A 04/14/2023   Procedure: CORONARY STENT INTERVENTION;  Surgeon: Anner Alm ORN, MD;  Location: [facility] INVASIVE CV LAB;  Service: Cardiovascular;  Laterality: N/A;   ESOPHAGOGASTRODUODENOSCOPY N/A 04/28/2023   Procedure: EGD (ESOPHAGOGASTRODUODENOSCOPY);  Surgeon: Nandigam, Kavitha V, MD;  Location: [facility] ENDOSCOPY;  Service: Gastroenterology;  Laterality: N/A;   FEMORAL-POPLITEAL BYPASS GRAFT Right 03/31/2016   Procedure: BYPASS GRAFT FEMORAL-POPLITEAL ARTERY USING RIGHT GREATER SAPHENOUS NONREVERSED VEIN;  Surgeon: Lonni GORMAN Blade, MD;  Location: [facility] OR;  Service: Vascular;  Laterality: Right;   HERNIA REPAIR     I & D  EXTREMITY Right 03/31/2016   Procedure: IRRIGATION AND DEBRIDEMENT FOOT;  Surgeon: Lonni GORMAN Blade, MD;  Location: [facility] OR;  Service: Vascular;  Laterality: Right;   IMPLANTABLE CARDIOVERTER DEFIBRILLATOR IMPLANT N/A 06/27/2013   Procedure: SUB Q ICD;  Surgeon: Elspeth JAYSON FERNANDE, MD;  Location: [facility] CATH LAB;  Service: Cardiovascular;  Laterality: N/A;   INTRAOPERATIVE ARTERIOGRAM Right 03/31/2016   Procedure: INTRA OPERATIVE ARTERIOGRAM;  Surgeon: Lonni GORMAN Blade, MD;  Location: [facility] OR;  Service: Vascular;  Laterality: Right;   IR GENERIC HISTORICAL Right 11/30/2015   IR THROMBECTOMY AV FISTULA  W/THROMBOLYSIS/PTA INC/SHUNT/IMG RIGHT 11/30/2015 Marcey Moan, MD [facility]   IR GENERIC HISTORICAL  11/30/2015   IR US  GUIDE VASC ACCESS RIGHT 11/30/2015 Marcey Moan, MD [facility]   IR GENERIC HISTORICAL Right 12/15/2015   IR THROMBECTOMY AV FISTULA W/THROMBOLYSIS/PTA/STENT INC/SHUNT/IMG RT 12/15/2015 Toribio Faes, MD [facility]   IR GENERIC HISTORICAL  12/15/2015   IR US  GUIDE VASC ACCESS RIGHT 12/15/2015 Toribio Faes, MD [facility]   IR GENERIC HISTORICAL  12/28/2015   IR FLUORO GUIDE CV LINE RIGHT 12/28/2015 Rome Hall, MD [facility]   IR GENERIC HISTORICAL  12/28/2015   IR US  GUIDE VASC ACCESS RIGHT 12/28/2015 Rome Hall, MD [facility]   LEFT A ND RIGHT HEART CATH  01/30/2013   DR CHET   LEFT AND RIGHT HEART CATHETERIZATION WITH CORONARY ANGIOGRAM N/A 01/30/2013   Procedure: LEFT AND RIGHT HEART CATHETERIZATION WITH CORONARY ANGIOGRAM;  Surgeon: Toribio JONELLE Fuel, MD;  Location: [facility] CATH LAB;  Service: Cardiovascular;  Laterality: N/A;   LEFT HEART CATH AND CORONARY ANGIOGRAPHY N/A 04/12/2023   Procedure: LEFT HEART CATH AND CORONARY ANGIOGRAPHY;  Surgeon: Swaziland, Peter M, MD;  Location: [facility] INVASIVE CV LAB;  Service: Cardiovascular;  Laterality: N/A;   PERIPHERAL VASCULAR CATHETERIZATION Right 01/26/2015   Procedure: A/V Fistulagram;  Surgeon: Lonni GORMAN Blade,  MD;  Location: [facility] INVASIVE CV LAB;  Service: Cardiovascular;  Laterality: Right;   reapea urethral surgery for recurrent obstruction  2011   TOTAL KNEE ARTHROPLASTY Right 2007   VEIN HARVEST Right 03/31/2016   Procedure: RIGHT GREATER SAPHENOUS VEIN HARVEST;  Surgeon: Lonni GORMAN Blade, MD;  Location: [facility] OR;  Service: Vascular;  Laterality: Right;     Social History:   reports that he quit smoking about 13 years ago. His smoking use included cigarettes. He started smoking about 45 years ago. He has a 64 pack-year smoking history. He has never used smokeless tobacco. He reports that he does not drink alcohol  and does not use drugs.   Family History:  His family history includes Alcohol  abuse in his father; Bladder Cancer in his mother; Diabetes in his maternal grandmother; Heart Problems in his maternal grandmother; Heart disease in his maternal grandfather; Melanoma in his father; Prostate cancer in his maternal grandfather; Stroke in his maternal grandmother.   Allergies Allergies  Allergen Reactions   Epoetin  Alfa Other (See Comments)    Unknown    Ferumoxytol  Other (See Comments)    Unknown    Morphine Sulfate Rash and Other (See Comments)    Itches all over, red spots     Home Medications  Prior to Admission medications   Medication Sig Start Date End Date Taking? Authorizing Provider  acetaminophen  (TYLENOL ) 325 MG tablet Take 1-2 tablets (325-650 mg total) by mouth every 4 (four) hours as needed for mild pain. 11/29/19  Yes Love, Sharlet GORMAN, PA-C  amiodarone  (PACERONE ) 200 MG tablet TAKE 1 TABLET ( 200 MG ) BY MOUTH TWICE DAILY FOR 20 DAYS, THEN 1 TABLET ( 200 MG TOTAL ) ONCE DAILY Patient taking differently: Take 200 mg by mouth daily. 09/18/23  Yes Burchette, Wolm ORN, MD  apixaban  (ELIQUIS ) 5 MG TABS tablet Take 1 tablet (5 mg total) by mouth 2 (two) times daily. 05/22/23  Yes Ghimire, Donalda HERO, MD  atorvastatin  (LIPITOR ) 80 MG tablet Take 1 tablet (80 mg total) by mouth at  bedtime. 09/20/23  Yes Burchette, Wolm ORN, MD  carvedilol  (COREG ) 6.25 MG tablet Take 1 tablet (6.25 mg total) by mouth 2 (  two) times daily. 09/20/23  Yes Burchette, Wolm ORN, MD  cinacalcet  (SENSIPAR ) 30 MG tablet Take 1 tablet (30 mg total) by mouth Every Tuesday,Thursday,and Saturday with dialysis. 11/30/19  Yes Love, Sharlet RAMAN, PA-C  clopidogrel  (PLAVIX ) 75 MG tablet Take 1 tablet (75 mg total) by mouth daily with breakfast. 08/14/23  Yes Burchette, Wolm ORN, MD  gabapentin  (NEURONTIN ) 300 MG capsule Take 1 capsule (300 mg total) by mouth at bedtime. 04/15/23  Yes Hongalgi, Anand D, MD  HUMALOG  KWIKPEN 200 UNIT/ML KwikPen INJECT A MAXIMUM OF 28 UNITS SUBCUTANEOUSLY TWICE DAILY WITH LUNCH AND SUPPER PER SLIDING SCALE. APPOINTMENT REQUIRED FOR FUTURE REFILLS 08/16/23  Yes Burchette, Wolm ORN, MD  lidocaine  (LIDODERM ) 5 % Place 1 patch onto the skin daily as needed (pain). Remove & Discard patch within 12 hours or as directed by MD   Yes [provider]  midodrine  (PROAMATINE ) 10 MG tablet Take 1 tablet (10 mg total) by mouth every 8 (eight) hours. Patient taking differently: Take 10 mg by mouth every 8 (eight) hours. Takes once a day on dialysis days ONLY 09/20/23  Yes Burchette, Wolm ORN, MD  multivitamin (RENA-VIT) TABS tablet Take 1 tablet by mouth once daily 07/06/21  Yes Burchette, Wolm ORN, MD  Olopatadine  HCl 0.2 % SOLN Place 1 drop into both eyes daily as needed (for allergies).    Yes [provider]  VENTOLIN  HFA 108 (90 Base) MCG/ACT inhaler INHALE 1 TO 2 PUFFS BY MOUTH EVERY 6 HOURS AS NEEDED FOR WHEEZING FOR SHORTNESS OF BREATH 05/05/23  Yes Burchette, Wolm ORN, MD  Continuous Glucose Receiver (FREESTYLE LIBRE 3 READER) DEVI Use to check blood glucose TID 08/11/22   Burchette, Wolm ORN, MD  Continuous Glucose Sensor (FREESTYLE LIBRE 3 PLUS SENSOR) MISC USE AS DIRECTED TO CHECK GLUCOSE DAILY. CHANGE EVERY 15 DAYS 05/24/23   Burchette, Wolm ORN, MD  Elastic Bandages & Supports (ABDOMINAL  BINDER/ELASTIC MED) MISC 1 kit by Does not apply route daily. 06/09/23   Miriam Norris, NP  glucose blood test strip Check 1 time daily. E11.9 One Touch Ultra Blue Test Strips 03/10/14   Burchette, Wolm ORN, MD  Insulin  Pen Needle (BD PEN NEEDLE NANO U/F) 32G X 4 MM MISC USE 1 PEN NEEDLE SUBCUTANEOUSLY WITH INSULIN  4 TIMES DAILY 12/04/19   Burchette, Wolm ORN, MD  nitroGLYCERIN  (NITROSTAT ) 0.4 MG SL tablet Place 1 tablet (0.4 mg total) under the tongue every 5 (five) minutes as needed for chest pain. 04/21/23   Burchette, Wolm ORN, MD     Critical care time: 35 minutes     Lucie Vinie [facility] 10/13/2023, 7:53 AM  Please [place]  From 7A-7P if no response, please call [phone]. After hours, please call ELink [phone].

## 2023-10-13 NOTE — Progress Notes (Signed)
 Patient was transported to CT for biopsy and back to 4N31 without any complications.

## 2023-10-13 NOTE — Progress Notes (Signed)
 PHARMACY - ANTICOAGULATION CONSULT NOTE  Pharmacy Consult for heparin  Indication: atrial fibrillation  Allergies  Allergen Reactions   Epoetin  Alfa Other (See Comments)    Unknown    Ferumoxytol  Other (See Comments)    Unknown    Morphine Sulfate Rash and Other (See Comments)    Itches all over, red spots    Patient Measurements: Height: 5' 10 (177.8 cm) Weight: 81.8 kg (180 lb 5.4 oz) IBW/kg (Calculated) : 73 HEPARIN  DW (KG): 82.1  Vital Signs: Temp: 99.3 F (37.4 C) (10/03 1211) Temp Source: Axillary (10/03 1211) BP: 90/69 (10/03 1300) Pulse Rate: 58 (10/03 1300)  Labs: Recent Labs    10/11/23 1635 10/11/23 2024 10/12/23 0036 10/12/23 0502 10/12/23 0753 10/12/23 0823 10/12/23 1029 10/12/23 1609 10/13/23 0516  HGB  --  11.7* 11.6* 11.7*  --   --   --   --  10.6*  HCT  --  34.4* 34.6* 35.1*  --   --   --   --  31.4*  PLT  --   --   --  486*  --   --  411*  --  211  APTT  --  >200*  --   --  187*  --  177*  --  47*  LABPROT  --   --   --   --   --   --  29.9*  --   --   INR  --   --   --   --   --   --  2.7*  --   --   HEPARINUNFRC  --  >1.10*  --   --  >1.10*  --   --   --  >1.10*  CREATININE 10.59*  --   --  11.17*  --   --   --  7.59* 5.02*  TROPONINIHS 1,127*  --   --   --   --  5,135* 5,169*  --   --     Estimated Creatinine Clearance: 15.8 mL/min (A) (by C-G formula based on SCr of 5.02 mg/dL (H)).  Assessment: Patient is a 63 year old admitted for septic shock requiring multiple pressors and pleural effusion, complicated by respiratory arrest. He has a history of atrial fibrillation on Eliquis  PTA. Last dose Eliquis  10/09/23 AM.  Pharmacy consulted to dose IV heparin .  Heparin  level and aPTT were elevated on 10/1 and 10/2 even after holding heparin  and decreasing dose. DIC panel was drawn due to high aptt despite dose reduction and septic shock on multiple pressors. INR resulted at 2.7 and heparin  was paused per discussion with CCM.   Patient is going  for biopsy today. aPTT 47 this AM after holding heparin  yesterday. PLT decreased from 486 to 211 over 24h, but started CRRT yesterday. Hgb 10.6 down from 11.7 yesterday. No clinical bleeding signs noted.   Goal of Therapy:  Heparin  level 0.3-0.7 units/ml aPTT 66-102 seconds Monitor platelets by anticoagulation protocol: Yes   Plan:  Continue to hold heparin  for biopsy today. Tentative plan to restart heparin  tomorrow morning per CCM.  Will continue getting Anti-xa and aPTT labs daily.  Will repeat INR tomorrow morning.   Vermell Mccallum, PharmD 10/13/2023, 1:45 PM

## 2023-10-13 NOTE — Progress Notes (Signed)
  [facility] Progress Note   Subjective: Off epi gtt, still on levo and vaso CVP 15-20  Objective Vitals:   10/13/23 0812 10/13/23 0816 10/13/23 0900 10/13/23 1000  BP:   (!) 145/112 (!) 117/106  Pulse:   (!) 52 (!) 54  Resp:   (!) 24 16  Temp:      TempSrc:      SpO2: 100% 100% 100% 99%  Weight:      Height:       Physical Exam Gen on vent, sedated Sclera anicteric, throat w/ ETT No jvd or bruits Chest clear anterior/ lateral RRR no MRG Abd soft ntnd no mass or ascites +bs GU nl male Ext bilat BKA, no edema Neuro is on vent, sedated TDC in R chest   OP HD: TTS [facility] 4h  B400   82.5kg  1K bath  TDC  Hep none - Calcitriol  0.75mcg PO q HD - Cinacalcet  90mg  PO q HD   Assessment/Plan: R loc pleural effusion/ RLL consolidation vs atx: w/ extensive R pleural nodularity and possibly pathologic rib fx's. Prob PNA and possible malignancy. SP 1.8 L thora on 9/26, then 500 cc on 9/28. Going for diagnostic procedure w/ IR today. Septic shock: down to 2 pressors today Cardiac arrest: on 10/01, ROSC approx 10 min.  ESRD: HD on TTS. Had 2 hrs HD Tuesday. CRRT started 10/02 due to septic shock. Cont CRRT.  Anemia of ESRD: Hgb 13. ESA would be contraindicated at this time in setting of presumed cancer Dx. Volume: CVP's up to 15-20, will try to pull 50- 100 cc/hr w/ crrt Secondary HPTH: CorrCa/Phos ok - not getting binders here - follow for now. Nutrition: Alb low, continue supplements. A-fib T2DM   Myer Fret  MD  [facility] 10/13/2023, 12:04 PM  Recent Labs  Lab 10/12/23 0502 10/12/23 1609 10/13/23 0516  HGB 11.7*  --  10.6*  ALBUMIN  2.4* 2.2* 2.1*  CALCIUM  7.1* 7.2* 7.6*  PHOS 6.9* 3.7 2.6  CREATININE 11.17* 7.59* 5.02*  K 4.7 4.1 4.3    Inpatient medications:  arformoterol  15 mcg Nebulization Q12H   atorvastatin   80 mg Per Tube QHS   budesonide (PULMICORT) nebulizer solution  0.5 mg Nebulization BID   Chlorhexidine  Gluconate Cloth  6 each  Topical Q0600   clopidogrel   75 mg Per Tube Q breakfast   docusate  100 mg Per Tube BID   famotidine   20 mg Per Tube Daily   hydrocortisone  sod succinate (SOLU-CORTEF ) inj  100 mg Intravenous Q12H   multivitamin  1 tablet Per Tube Daily   mouth rinse  15 mL Mouth Rinse Q2H   polyethylene glycol  17 g Per Tube Daily   revefenacin  175 mcg Nebulization Daily   sodium chloride  flush  10 mL Intrapleural Q8H   sodium chloride  flush  3 mL Intravenous Q12H    doxycycline  (VIBRAMYCIN ) IV Stopped (10/13/23 1110)   fentaNYL  infusion INTRAVENOUS 300 mcg/hr (10/13/23 1200)   insulin  0.3 Units/hr (10/13/23 1200)   norepinephrine  (LEVOPHED ) Adult infusion 16 mcg/min (10/13/23 1200)   piperacillin -tazobactam (ZOSYN )  IV 100 mL/hr at 10/13/23 1200   prismasol BGK 4/2.5 1,400 mL/hr at 10/13/23 1155   prismasol BGK 4/2.5 400 mL/hr at 10/13/23 1155   prismasol BGK 4/2.5 400 mL/hr at 10/13/23 1154   vasopressin  0.04 Units/min (10/13/23 1200)   acetaminophen  **OR** acetaminophen , dextrose , fentaNYL , heparin , heparin , ipratropium-albuterol , ondansetron  (ZOFRAN ) IV, mouth rinse

## 2023-10-13 NOTE — Procedures (Signed)
 Cortrak  Person Inserting Tube:  Katrinka Millman D, RD Tube Type:  Cortrak - 43 inches Tube Size:  10 Tube Location:  Right nare Secured by: Bridle Initial Placement:  Gastric Technique Used to Measure Tube Placement:  Marking at nare/corner of mouth Cortrak Secured At:  73 cm Initial Placement Verification:  Cortrak device (Registered Dieticians Only) Procedure Comments:  Cortrak Tube Team Note:  Consult received to place a Cortrak feeding tube.   No x-ray is required. RN may begin using tube.   If the tube becomes dislodged please keep the tube and contact the Cortrak team at [email or web address] for replacement.  If after hours and replacement cannot be delayed, place a NG tube and confirm placement with an abdominal x-ray.    Millman Katrinka, RD, LDN, CNSC Registered Dietitian II Please reach out via secure chat

## 2023-10-14 ENCOUNTER — Inpatient Hospital Stay (HOSPITAL_COMMUNITY)

## 2023-10-14 DIAGNOSIS — N186 End stage renal disease: Secondary | ICD-10-CM | POA: Diagnosis not present

## 2023-10-14 DIAGNOSIS — Z515 Encounter for palliative care: Secondary | ICD-10-CM | POA: Diagnosis not present

## 2023-10-14 DIAGNOSIS — I469 Cardiac arrest, cause unspecified: Secondary | ICD-10-CM | POA: Diagnosis not present

## 2023-10-14 DIAGNOSIS — J9601 Acute respiratory failure with hypoxia: Secondary | ICD-10-CM | POA: Diagnosis not present

## 2023-10-14 DIAGNOSIS — J90 Pleural effusion, not elsewhere classified: Secondary | ICD-10-CM | POA: Diagnosis not present

## 2023-10-14 DIAGNOSIS — Z992 Dependence on renal dialysis: Secondary | ICD-10-CM | POA: Diagnosis not present

## 2023-10-14 LAB — GLUCOSE, CAPILLARY
Glucose-Capillary: 133 mg/dL — ABNORMAL HIGH (ref 70–99)
Glucose-Capillary: 142 mg/dL — ABNORMAL HIGH (ref 70–99)
Glucose-Capillary: 144 mg/dL — ABNORMAL HIGH (ref 70–99)
Glucose-Capillary: 152 mg/dL — ABNORMAL HIGH (ref 70–99)
Glucose-Capillary: 155 mg/dL — ABNORMAL HIGH (ref 70–99)
Glucose-Capillary: 161 mg/dL — ABNORMAL HIGH (ref 70–99)
Glucose-Capillary: 172 mg/dL — ABNORMAL HIGH (ref 70–99)
Glucose-Capillary: 176 mg/dL — ABNORMAL HIGH (ref 70–99)
Glucose-Capillary: 180 mg/dL — ABNORMAL HIGH (ref 70–99)
Glucose-Capillary: 180 mg/dL — ABNORMAL HIGH (ref 70–99)
Glucose-Capillary: 187 mg/dL — ABNORMAL HIGH (ref 70–99)
Glucose-Capillary: 191 mg/dL — ABNORMAL HIGH (ref 70–99)
Glucose-Capillary: 193 mg/dL — ABNORMAL HIGH (ref 70–99)
Glucose-Capillary: 194 mg/dL — ABNORMAL HIGH (ref 70–99)
Glucose-Capillary: 194 mg/dL — ABNORMAL HIGH (ref 70–99)
Glucose-Capillary: 200 mg/dL — ABNORMAL HIGH (ref 70–99)
Glucose-Capillary: 206 mg/dL — ABNORMAL HIGH (ref 70–99)
Glucose-Capillary: 209 mg/dL — ABNORMAL HIGH (ref 70–99)
Glucose-Capillary: 211 mg/dL — ABNORMAL HIGH (ref 70–99)
Glucose-Capillary: 211 mg/dL — ABNORMAL HIGH (ref 70–99)
Glucose-Capillary: 211 mg/dL — ABNORMAL HIGH (ref 70–99)
Glucose-Capillary: 216 mg/dL — ABNORMAL HIGH (ref 70–99)
Glucose-Capillary: 217 mg/dL — ABNORMAL HIGH (ref 70–99)

## 2023-10-14 LAB — RENAL FUNCTION PANEL
Albumin: 2.2 g/dL — ABNORMAL LOW (ref 3.5–5.0)
Albumin: 2.2 g/dL — ABNORMAL LOW (ref 3.5–5.0)
Anion gap: 12 (ref 5–15)
Anion gap: 13 (ref 5–15)
BUN: 25 mg/dL — ABNORMAL HIGH (ref 8–23)
BUN: 25 mg/dL — ABNORMAL HIGH (ref 8–23)
CO2: 19 mmol/L — ABNORMAL LOW (ref 22–32)
CO2: 21 mmol/L — ABNORMAL LOW (ref 22–32)
Calcium: 7.6 mg/dL — ABNORMAL LOW (ref 8.9–10.3)
Calcium: 7.8 mg/dL — ABNORMAL LOW (ref 8.9–10.3)
Chloride: 105 mmol/L (ref 98–111)
Chloride: 102 mmol/L (ref 98–111)
Creatinine, Ser: 3.13 mg/dL — ABNORMAL HIGH (ref 0.61–1.24)
Creatinine, Ser: 0.61 mg/dL (ref 0.61–1.24)
GFR, Estimated: 22 mL/min — ABNORMAL LOW (ref >60)
GFR, Estimated: >60 mL/min (ref >60)
Glucose, Bld: 189 mg/dL — ABNORMAL HIGH (ref 70–99)
Glucose, Bld: 175 mg/dL — ABNORMAL HIGH (ref 70–99)
Phosphorus: 3.2 mg/dL (ref 2.5–4.6)
Phosphorus: 2.2 mg/dL — ABNORMAL LOW (ref 2.5–4.6)
Potassium: 4.8 mmol/L (ref 3.5–5.1)
Potassium: 3.9 mmol/L (ref 3.5–5.1)
Sodium: 136 mmol/L (ref 135–145)
Sodium: 136 mmol/L (ref 135–145)

## 2023-10-14 LAB — APTT
aPTT: 47 s — ABNORMAL HIGH (ref 24–36)
aPTT: 101 s — ABNORMAL HIGH (ref 24–36)

## 2023-10-14 LAB — CULTURE, RESPIRATORY W GRAM STAIN

## 2023-10-14 LAB — CBC
HCT: 32.3 % — ABNORMAL LOW (ref 39.0–52.0)
Hemoglobin: 10.6 g/dL — ABNORMAL LOW (ref 13.0–17.0)
MCH: 28.8 pg (ref 26.0–34.0)
MCHC: 32.8 g/dL (ref 30.0–36.0)
MCV: 87.8 fL (ref 80.0–100.0)
Platelets: 231 K/uL (ref 150–400)
RBC: 3.68 MIL/uL — ABNORMAL LOW (ref 4.22–5.81)
RDW: 15.5 % (ref 11.5–15.5)
WBC: 24.9 K/uL — ABNORMAL HIGH (ref 4.0–10.5)
nRBC: 0 % (ref 0.0–0.2)

## 2023-10-14 LAB — PROTIME-INR
INR: 1.5 — ABNORMAL HIGH (ref 0.8–1.2)
Prothrombin Time: 19 s — ABNORMAL HIGH (ref 11.4–15.2)

## 2023-10-14 LAB — MAGNESIUM: Magnesium: 2.4 mg/dL (ref 1.7–2.4)

## 2023-10-14 LAB — TRIGLYCERIDES: Triglycerides: 182 mg/dL — ABNORMAL HIGH (ref <150)

## 2023-10-14 LAB — CHOLESTEROL, BODY FLUID: Cholesterol, Fluid: 33 mg/dL

## 2023-10-14 LAB — HEPARIN LEVEL (UNFRACTIONATED): Heparin Unfractionated: >1.1 [IU]/mL — ABNORMAL HIGH (ref 0.30–0.70)

## 2023-10-14 MED ORDER — DOXYCYCLINE HYCLATE 100 MG PO TABS
100.0000 mg | ORAL_TABLET | Freq: Two times a day (BID) | ORAL | Status: DC
Start: 2023-10-14 — End: 2023-10-18
  Administered 2023-10-14 – 2023-10-15 (×3): 100 mg
  Filled 2023-10-14 (×4): qty 1

## 2023-10-14 MED ORDER — ETOMIDATE 2 MG/ML IV SOLN: 10.0000 mg | INTRAVENOUS | Status: AC

## 2023-10-14 MED ORDER — HEPARIN (PORCINE) 25000 UT/250ML-% IV SOLN
750.0000 [IU]/h | INTRAVENOUS | Status: DC
  Administered 2023-10-14 – 2023-10-15 (×2): 800 [IU]/h via INTRAVENOUS
  Administered 2023-10-17: 650 [IU]/h via INTRAVENOUS
  Administered 2023-10-19: 750 [IU]/h via INTRAVENOUS
  Filled 2023-10-14 (×4): qty 250

## 2023-10-14 MED ORDER — DEXMEDETOMIDINE HCL IN NACL 400 MCG/100ML IV SOLN
0.0000 ug/kg/h | INTRAVENOUS | Status: DC
  Administered 2023-10-15: 0.2 ug/kg/h via INTRAVENOUS
  Administered 2023-10-15 – 2023-10-16 (×2): 0.4 ug/kg/h via INTRAVENOUS
  Filled 2023-10-14 (×3): qty 100

## 2023-10-14 MED ORDER — ETOMIDATE 2 MG/ML IV SOLN
INTRAVENOUS | Status: AC
  Filled 2023-10-14: qty 10

## 2023-10-14 MED ORDER — ROCURONIUM BROMIDE 10 MG/ML (PF) SYRINGE: 100.0000 mg | PREFILLED_SYRINGE | INTRAVENOUS | Status: AC

## 2023-10-14 MED ORDER — ETOMIDATE 2 MG/ML IV SOLN
INTRAVENOUS | Status: AC
  Administered 2023-10-14: 20 mg
  Filled 2023-10-14: qty 20

## 2023-10-14 MED ORDER — DEXMEDETOMIDINE HCL IN NACL 400 MCG/100ML IV SOLN
INTRAVENOUS | Status: AC
  Administered 2023-10-14: 0.4 ug/kg/h via INTRAVENOUS
  Filled 2023-10-14: qty 100

## 2023-10-14 MED ORDER — ROCURONIUM BROMIDE 10 MG/ML (PF) SYRINGE
PREFILLED_SYRINGE | INTRAVENOUS | Status: AC
  Administered 2023-10-14: 100 mg via INTRAVENOUS
  Filled 2023-10-14: qty 10

## 2023-10-14 MED ORDER — SODIUM CHLORIDE 0.9 % IV SOLN
1.0000 g | Freq: Three times a day (TID) | INTRAVENOUS | Status: DC
  Administered 2023-10-14 – 2023-10-19 (×16): 1 g via INTRAVENOUS
  Filled 2023-10-14 (×16): qty 20

## 2023-10-14 NOTE — Progress Notes (Signed)
 og  NAME:  Anthony Bullock, MRN:  [id number], DOB:  01-26-1960, LOS: 7 ADMISSION DATE:  10/06/2023, CONSULTATION DATE:  10/09/23 REFERRING MD:  Dr. Trixie, CHIEF COMPLAINT:  SOB    History of Present Illness:   78 yoM with PMH significant for COPD, ESRD TTS HD, HTN, HLD, HFrEF EF 40, HFpEF, PAF on Eliquis , CAD on plavix , DMT2, PVD s/p bilateral BKA, anemia, GIB who presented 9/26 with one month hx of SOB and intermittent right sided chest pain radiating into R shoulder.  No cough or fever. CXR performed while at HD 9/25 which showed right pleural effusion, recommended further evaluation and sent to ER.   Former smoker, quit 13 yrs ago.  Has 64 pack yr history.  Pt is not always forth coming with specific information, wife at bedside is helpful.  Pt reports no recent fever or chills, N/V.  Has had sinus congestion with yellow drainage and tint of blood for several days and as of yesterday having intermittent yellowish productive cough.  Does report exertional dyspnea and intermittent pleuritic pain with inspiration and R shoulder pain for at least 4 weeks.  Poor appetite over last week and reports weight loss, ?20lbs but on chart review, is down ~6kg since May.  Also, wife reports pt had fall with resultant right sided chest pain after hospitalization in May for pulmonary edema and April for GIB and s/p PCI to LAD/ RCA 04/12/23; pt did not seek evaluation after fall.  Also had fall last week after getting off lawn mower after prolonged use, denies hitting head. Wife reports it is not uncommon for him to fall due to weakness after HD and ongoing use of BLE prosthetics. On imaging review, pt has had moderate right pleural effusion since CXR in May 2025.  On admit, required 4L [place].  Afebrile and normal WBC/ differential. LFTs normal. Admitted to [facility].  Underwent IR thoracentesis on 9/26 with 1.85L of amber colored pleural fluid however was not sent for studies.  CXR 9/28 showed increasing R pleural effusion, tracking  laterally and underwent IR thoracentesis with of amber fluid which was sent for cell count and cytology.  Pt did report improvement in SOB after thoracentesis's and weaned to room air 9/28.  Chest CT w/ contrast performed after thoracentesis which showed extensive pleural nodularity in right chest, posterior right pleural nodule measures 4x1, with plaque-like nodular pleural thickening inferolaterally extending into right chest wall with pathologic fracture of anterior 5th and destruction of 6/7 ribs suspicious for malignancy, 17mm GGO in LUL, 5mm LLL pulmonary nodule, 13 mm paraesophageal lymph node, and RUL an RML volume loss with collapse/ consolidation in RLL with areas of loculated pleural fluid in right upper hemithorax.  Pleural fluid noted straw colored, LDH 660, neutrophil predominant, glucose 160, protein 5.5.  Pulmonary consulted for further recommendations.   On 10/1, Patient became hypotensive requiring Levophed .  Increasing WBC. Patient without complaints. Patient being followed by PCCM for pulmonary. Labs and scans reviewed-CT chest with contrast 9/28- not a PE protocol study, nodularity suggestive of possible malignancy, loculated large right pleural effusion.  Pertinent  Medical History  Former smoker, COPD, ESRD TTS HD, hypotension on midodrine , HTN, HLD, HFrEF, PAF, CAD, DMT2, peripheral neuropathy, PVD s/p bilateral BKA, anemia, GIB  Significant Hospital Events: Including procedures, antibiotic start and stop dates in addition to other pertinent events   9/26 admit, IR R thora 1.85 ml- no fluid sent 9/28 IR thora 520 ml, pleural studies sent 10/1 transferred to icu  with hypotension started on pressors and respiratory distress; brief cardiac arrest rosc 10 minutes; intubated 10/3: Remains on vaso and levo, on CRRT started pulling 50 cc an hour  Interim History / Subjective:  Patient continues require vasopressor support, currently on vasopressin  and Levophed  at 10 mics On  CRRT, with net -2.7 L in last 24 hours Sedation is being titrated down Did not tolerate a spontaneous breathing trial due to apnea and low minute ventilation   Objective    Blood pressure (!) 57/19, pulse (!) 55, temperature 98.9 F (37.2 C), temperature source Axillary, resp. rate 20, height 5' 10 (1.778 m), weight 76.7 kg, SpO2 100%. CVP:  [8 mmHg-21 mmHg] 12 mmHg  Vent Mode: PRVC FiO2 (%):  [30 %-40 %] 40 % Set Rate:  [24 bmp] 24 bmp Vt Set:  [580 mL] 580 mL PEEP:  [8 cmH20] 8 cmH20 Plateau Pressure:  [20 cmH20-24 cmH20] 20 cmH20   Intake/Output Summary (Last 24 hours) at 10/14/2023 1140 Last data filed at 10/14/2023 1100 Gross per 24 hour  Intake 2864.29 ml  Output 6351.5 ml  Net -3487.21 ml   Filed Weights   10/10/23 1734 10/10/23 1915 10/14/23 0700  Weight: 81.9 kg 81.8 kg 76.7 kg   Examination: General: Crtitically ill-appearing male, orally intubated HEENT: /AT, eyes anicteric.  ETT and cortrak in place Neuro: Opens eyes with vocal stimuli, not following commands, gets agitated and restless Chest: Reduced air entry at the bases right more than left, no wheezes or rhonchi.  Right-sided chest tube in place Heart: Bradycardic, regular rhythm, no murmurs or gallops Abdomen: Soft, nondistended, bowel sounds present extremities: Extremities: Status post bilateral BKA   Chest tube output 150 cc  Labs and images reviewed  Patient Lines/Drains/Airways Status     Active Line/Drains/Airways     Name Placement date Placement time Site Days   Arterial Line 10/11/23 Right Femoral 10/11/23  1000  Femoral  3   Peripheral IV 10/06/23 20 G Anterior;Left Forearm 10/06/23  1419  Forearm  8   Peripheral IV 10/11/23 22 G 1.75 Left Forearm 10/11/23  0616  Forearm  3   CVC Triple Lumen 10/11/23 Left Internal jugular 10/11/23  1000  -- 3   Hemodialysis Catheter Right Internal jugular Double-lumen;Permanent 12/28/15  1732  Internal jugular  2847   Chest Tube Lateral;Right Pleural  14 Fr. 10/11/23  0900  Pleural  3   Airway 7.5 mm 10/11/23  0825  -- 3   Small Bore Feeding Tube 10 Fr. Right nare Marking at nare/corner of mouth 73 cm 10/13/23  1106  Right nare  1   Wound 10/09/23 0800 Traumatic Elbow Anterior;Right 10/09/23  0800  Elbow  5   Wound 10/11/23 0522 Pressure Injury Buttocks Stage 1 -  Intact skin with non-blanchable redness of a localized area usually over a bony prominence. 10/11/23  0522  Buttocks  3         Resolved problem list   Assessment and Plan  Status post in hospital VT followed by PEA cardiac arrest in the setting of hypoxia Acute NSTEMI Acute on chronic combined systolic/diastolic heart failure Paroxysmal A-fib Coronary artery disease Shock, mixed cardiogenic and septic Acute septic encephalopathy Acute respiratory failure with hypoxia Recurrent loculated right-sided pleural effusion and status post chest tube placement Bilateral multifocal pneumonia with Pseudomonas Right-sided chest valve mass status post CT-guided biopsy End-stage renal disease on CRRT Hypervolemic hyponatremia Diabetes type 2 with hyperglycemia  No more episodes of VT's Remained in sinus rhythm  Appreciate cardiology follow-up, patient is not a candidate for cardiac cath at this point Continue IV heparin  infusion Continue volume removal via CRRT Continue to titrate vasopressor support with SBP goal 100-120 Currently on vasopressin  and Levophed  Chest tube output was 150 cc, once it is less than 100, will discontinue Patient failed spontaneous breathing trial due to apnea and low minute ventilation Continue lung protective ventilation VAP prevention bundle in place Respiratory culture grew Pseudomonas Continue IV Zosyn  and doxycycline  to complete 7-day therapy PAD protocol with continuous fentanyl  infusion at 100 mics per hour Try to keep RASS goal -1 Nephrology is following Closely monitor electrolytes Continue insulin  infusion with CBG goal 140-180 Chest  wall biopsy was done yesterday, pathology results pending Pleural fluid cytology is negative for malignancy   Best Practice (right click and Reselect all SmartList Selections daily)   Diet/type: tubefeeds- no nutrition - place coretrak and start tube feeds after biopsy DVT prophylaxis: SCD GI prophylaxis: H2B Lines: Central line, Dialysis Catheter, and Arterial Line Foley:  N/A Code Status:  full code Last date of multidisciplinary goals of care discussion [10/1 updated wife at bedside. Going to discuss with family to discuss dnr. States he would not want to be on vent long term.]   Labs   CBC: Recent Labs  Lab 10/11/23 0620 10/11/23 0931 10/11/23 0949 10/11/23 1325 10/11/23 2024 10/12/23 0036 10/12/23 0502 10/12/23 1029 10/13/23 0516 10/14/23 0320  WBC 27.1*  --  31.8*  --   --   --  31.5*  --  20.5* 24.9*  NEUTROABS 23.0*  --   --   --   --   --   --   --   --   --   HGB 13.7   < > 12.8*   < > 11.7* 11.6* 11.7*  --  10.6* 10.6*  HCT 41.9   < > 38.4*   < > 34.4* 34.6* 35.1*  --  31.4* 32.3*  MCV 86.9  --  87.1  --   --   --  85.6  --  85.1 87.8  PLT 474*  --  470*  --   --   --  486* 411* 211 231   < > = values in this interval not displayed.    Basic Metabolic Panel: Recent Labs  Lab 10/11/23 0620 10/11/23 0931 10/11/23 0949 10/11/23 1635 10/12/23 0502 10/12/23 1609 10/13/23 0516 10/13/23 1600 10/14/23 0320  NA 131*   < > 131*   < > 132* 135 137 135 136  K 4.5   < > 4.8   < > 4.7 4.1 4.3 4.5 4.8  CL 95*  --  92*   < > 93* 101 103 101 105  CO2 19*  --  16*   < > 17* 19* 20* 19* 19*  GLUCOSE 184*  --  284*   < > 403* 105* 119* 158* 189*  BUN 53*  --  58*   < > 69* 51* 32* 28* 25*  CREATININE 10.18*  --  10.48*   < > 11.17* 7.59* 5.02* 4.04* 3.13*  CALCIUM  7.8*  --  7.6*   < > 7.1* 7.2* 7.6* 7.3* 7.6*  MG 1.9  --  3.5*  --   --   --  2.8*  --  2.4  PHOS  --   --   --    < > 6.9* 3.7 2.6 3.3 3.2   < > = values in this interval not displayed.  GFR: Estimated Creatinine Clearance: 25.3 mL/min (A) (by C-G formula based on SCr of 3.13 mg/dL (H)). Recent Labs  Lab 10/10/23 0206 10/10/23 1626 10/11/23 0620 10/11/23 0940 10/11/23 0949 10/12/23 0502 10/12/23 0823 10/13/23 0516 10/14/23 0320  PROCALCITON 2.96  --   --   --   --   --   --   --   --   WBC 19.7*   < > 27.1*  --  31.8* 31.5*  --  20.5* 24.9*  LATICACIDVEN  --   --  1.8 3.1*  --   --  2.0*  --   --    < > = values in this interval not displayed.    Liver Function Tests: Recent Labs  Lab 10/11/23 0620 10/11/23 0949 10/11/23 1635 10/12/23 0502 10/12/23 1609 10/13/23 0516 10/13/23 1600 10/14/23 0320  AST 35 87*  --   --   --   --   --   --   ALT 25 52*  --   --   --   --   --   --   ALKPHOS 114 113  --   --   --   --   --   --   BILITOT 1.0 1.6*  --   --   --   --   --   --   PROT 6.5 6.5  --   --   --   --   --   --   ALBUMIN  1.9* 2.0*   < > 2.4* 2.2* 2.1* 2.1* 2.2*   < > = values in this interval not displayed.   No results for input(s): LIPASE, AMYLASE in the last 168 hours.  No results for input(s): AMMONIA in the last 168 hours.  ABG    Component Value Date/Time   PHART 7.319 (L) 10/11/2023 0931   PCO2ART 34.4 10/11/2023 0931   PO2ART 123 (H) 10/11/2023 0931   HCO3 17.7 (L) 10/11/2023 0931   TCO2 19 (L) 10/11/2023 0931   ACIDBASEDEF 8.0 (H) 10/11/2023 0931   O2SAT 72.9 10/12/2023 0926     Coagulation Profile: Recent Labs  Lab 10/12/23 1029 10/14/23 0320  INR 2.7* 1.5*    Cardiac Enzymes: No results for input(s): CKTOTAL, CKMB, CKMBINDEX, TROPONINI in the last 168 hours.  HbA1C: Hemoglobin A1C  Date/Time Value Ref Range Status  09/20/2023 02:10 PM 6.7 (A) 4.0 - 5.6 % Final  06/19/2023 01:57 PM 5.8 (A) 4.0 - 5.6 % Final  02/15/2018 12:00 AM 11.6  Final    Comment:    [facility] in [place]   Hgb A1c MFr Bld  Date/Time Value Ref Range Status  11/13/2019 04:43 PM 6.5 (H) 4.8 - 5.6 % Final    Comment:     (NOTE) Pre diabetes:          5.7%-6.4%  Diabetes:              >6.4%  Glycemic control for   <7.0% adults with diabetes   07/10/2019 04:44 AM 8.1 (H) 4.8 - 5.6 % Final    Comment:    (NOTE)         Prediabetes: 5.7 - 6.4         Diabetes: >6.4         Glycemic control for adults with diabetes: <7.0     CBG: Recent Labs  Lab 10/14/23 0647 10/14/23 0752 10/14/23 0859 10/14/23 0952 10/14/23 1056  GLUCAP 217* 216* 194* 211* 200*   The patient  is critically ill due to septic shock/acute respiratory failure with hypoxia.  Critical care was necessary to treat or prevent imminent or life-threatening deterioration.  Critical care was time spent personally by me on the following activities: development of treatment plan with patient and/or surrogate as well as nursing, discussions with consultants, evaluation of patient's response to treatment, examination of patient, obtaining history from patient or surrogate, ordering and performing treatments and interventions, ordering and review of laboratory studies, ordering and review of radiographic studies, pulse oximetry, re-evaluation of patient's condition and participation in multidisciplinary rounds.   During this encounter critical care time was devoted to patient care services described in this note for 45 minutes.     Valinda Novas, MD [facility] See Amion for pager If no response to pager, please call [phone] until 7pm After 7pm, Please call E-link [phone]

## 2023-10-14 NOTE — Progress Notes (Signed)
 Palliative Medicine Inpatient Follow Up Note   HPI: 63 y.o. male  with past medical history of HTN, HLD, chronic systolic CHF, paroxysmal atrial fibrillation, CAD ESRD on HD (TTS), DM type II, COPD, PVD s/p bilateral BKA, chronic anemia, and GI bleed  admitted on 10/06/2023 with shortness of breath and right-sided chest pain. [place]xt xray revealed large right-sided pleural effusion.    During this hospital stay, he underwent an IR-guided right thoracentesis with 1.85 mL removed; no fluid was sent for analysis. A subsequent thoracentesis on 9/28 yielded 520 mL of fluid, which was sent for pleural studies. On 10/1, the patient was transferred to the ICU due to hypotension and respiratory distress, requiring initiation of vasopressors. The clinical course was complicated by a brief cardiac arrest with ROSC after 10 minutes, and the patient was subsequently intubated.   Patient has had 4 hospital admissions over the last 6 months.    PMT has been consulted to assist with goal.  Today's Discussion 10/14/2023  *Please note that this is a verbal dictation therefore any spelling or grammatical errors are due to the Dragon [employer] One system interpretation.  Chart reviewed inclusive of vital signs, progress notes, laboratory results, and diagnostic images.   Visited patient today, no family member present at bedside. Patient critically-ill appearing.  Patient self-extubated, successfully re-intubated. Per ICU RN, patient remains on full ventilatory support. CRRT in progress. Per note, patient failed spontaneous breathing trial due to apnea and low minute ventilation.   Attempted to reach out to wife Devere telephonically, no answer. Left voice message to return call.   Current medications reviewed:  Precedex 0.4mcg/kg/hr Fentanyl  100 mcg/hr Heparin  800 units/hr Levophed  8 mcg/min Vasopressin  0.04 units/min  Patient and her family face treatment option decisions, advanced directive decisions and  anticipatory care needs.     Palliative Support Provided.   Objective Assessment: Vital Signs Vitals:   10/14/23 1100 10/14/23 1200  BP:    Pulse: (!) 57   Resp: (!) 22   Temp:  98.1 F (36.7 C)  SpO2: 100%     Intake/Output Summary (Last 24 hours) at 10/14/2023 1424 Last data filed at 10/14/2023 1400 Gross per 24 hour  Intake 3012.66 ml  Output 6432.8 ml  Net -3420.14 ml   Last Weight  Most recent update: 10/14/2023  7:41 AM    Weight  76.7 kg (169 lb 1.5 oz)             Physical Exam Vitals and nursing note reviewed.  Constitutional:      Appearance: He is toxic-appearing.  HENT:     Head: Normocephalic and atraumatic.  Cardiovascular:     Rate and Rhythm: Bradycardia present.  Pulmonary:     Comments: On mechanical vent, synchronous with vent.  Abdominal:     Palpations: Abdomen is soft.  Genitourinary:    Comments: Foley in place Musculoskeletal:     Comments: Bedbound, bilateral BKA   Neurological:     Comments: Sedated   SUMMARY OF RECOMMENDATIONS    Code Status: Maintain Full code Continue current scope of care Goal of care is medical stabilization and recovery to the extent this is possible, allow time for outcomes Continue to provide psycho-social and emotional support to patient and family Palliative medicine team will continue to follow.    Symptom Management: Per ICU team Palliative medicine is available to assist as needed.     Time Spent: 35 minutes  Detailed review of medical records (labs, imaging, vital signs), medically  appropriate exam, discussed with treatment team, counseling and education to patient, family, & staff, documenting clinical information, coordination of care.   ___________________________________________________________________________ Kathlyne Bolder NP-C [place] [facility] Team Team Cell Phone: [phone] Please utilize secure chat with additional questions, if there is no response within 30  minutes please call the above phone number  Palliative Medicine Team providers are available by phone from 7am to 7pm daily and can be reached through the team cell phone.  Should this patient require assistance outside of these hours, please call the patient's attending physician.

## 2023-10-14 NOTE — Progress Notes (Signed)
 PHARMACY - ANTICOAGULATION CONSULT NOTE  Pharmacy Consult for heparin  Indication: atrial fibrillation  Allergies  Allergen Reactions   Epoetin  Alfa Other (See Comments)    Unknown    Ferumoxytol  Other (See Comments)    Unknown    Morphine Sulfate Rash and Other (See Comments)    Itches all over, red spots    Patient Measurements: Height: 5' 10 (177.8 cm) Weight: 76.7 kg (169 lb 1.5 oz) IBW/kg (Calculated) : 73 HEPARIN  DW (KG): 82.1  Vital Signs: Temp: 99 F (37.2 C) (10/04 2000) Temp Source: Axillary (10/04 2000) BP: 80/23 (10/04 1600) Pulse Rate: 59 (10/04 2000)  Labs: Recent Labs    10/12/23 0502 10/12/23 0502 10/12/23 0753 10/12/23 0823 10/12/23 1029 10/12/23 1609 10/13/23 0516 10/13/23 1600 10/14/23 0320 10/14/23 1640 10/14/23 1935  HGB 11.7*  --   --   --   --   --  10.6*  --  10.6*  --   --   HCT 35.1*  --   --   --   --   --  31.4*  --  32.3*  --   --   PLT 486*  --   --   --  411*  --  211  --  231  --   --   APTT  --    < > 187*  --  177*  --  47*  --  47*  --  101*  LABPROT  --   --   --   --  29.9*  --   --   --  19.0*  --   --   INR  --   --   --   --  2.7*  --   --   --  1.5*  --   --   HEPARINUNFRC  --   --  >1.10*  --   --   --  >1.10*  --  >1.10*  --   --   CREATININE 11.17*  --   --   --   --    < > 5.02* 4.04* 3.13* 0.61  --   TROPONINIHS  --   --   --  5,135* 5,169*  --   --   --   --   --   --    < > = values in this interval not displayed.    Estimated Creatinine Clearance: 98.9 mL/min (by C-G formula based on SCr of 0.61 mg/dL).  Assessment: Patient is a 63 year old admitted for septic shock requiring multiple pressors and pleural effusion, complicated by respiratory arrest. He has a history of atrial fibrillation on Eliquis  PTA. Last dose Eliquis  10/09/23 AM.  Pharmacy consulted to dose IV heparin . Heparin  held for biopsy 10/3, ok to restart 10/14 per team.    HL still >1.1 despite no apixaban  since 9/29. Will follow aPTT.  INR down to  1.5. Hgb stable. Patient's APTT stayed high after bolus 10/1, will hold bolus. Note baseline aPTT after being off heparin  for 19 hours is still elevated at 47.   10/4 PM: aPTT upper therapeutic range at 101. No infusion issues or bleeding. Will check confirmatory level with AM labs. Goal of Therapy:  Heparin  level 0.3-0.7 units/ml aPTT 66-102 seconds Monitor platelets by anticoagulation protocol: Yes   Plan:  Continue heparin  800 units/hr F/u am aPTT and Heparin  Level Monitor daily aPTT, heparin  level, CBC, signs/symptoms of bleeding   Larraine Brazier, PharmD Clinical Pharmacist 10/14/2023  9:03 PM **Pharmacist phone  directory can now be found on [email or web address] ([email or web address]).  Listed under [facility].

## 2023-10-14 NOTE — Progress Notes (Signed)
 [facility] Progress Note   Subjective: Remains on levo and vaso pressor support  CVP down around 12 per RN Pulling 100- 200 cc/hr overnight Net neg 800 yest and net neg 2.5 L already today  Objective Vitals:   10/14/23 1100 10/14/23 1200 10/14/23 1300 10/14/23 1400  BP:      Pulse: (!) 57 (!) 57 66 (!) 56  Resp: (!) 22 (!) 22  (!) 24  Temp:  98.1 F (36.7 C)    TempSrc:  Axillary    SpO2: 100% 100% 100% 100%  Weight:      Height:       Physical Exam Gen on vent, sedated Sclera anicteric, throat w/ ETT No jvd or bruits Chest clear anterior/ lateral RRR no MRG Abd soft ntnd no mass or ascites +bs GU nl male Ext bilat BKA, no edema Neuro is on vent, sedated TDC in R chest   OP HD: TTS [facility] 4h  B400   82.5kg  1K bath  TDC  Hep none - Calcitriol  0.75mcg PO q HD - Cinacalcet  90mg  PO q HD   Assessment/Plan: R loc pleural effusion/ RLL consolidation vs atx: w/ extensive R pleural nodularity and possibly pathologic rib fx's. Prob PNA and possible malignancy. SP 1.8 L thora on 9/26, then 500 cc on 9/28. IR did CT guided biopsy of R chest wall mass on 10/3.  Septic shock: levo down to 6- 8 mcg/min and vaso stable at 0.04 Cardiac arrest: on 10/01, ROSC approx 10 min.  ESRD: HD on TTS. Had HD x 1 on 9/30. CRRT started 10/02 due to shock. Cont CRRT.  Anemia of ESRD: Hgb 13. ESA would be contraindicated at this time in setting of presumed cancer Dx. Volume: CVP's were up to 15-20, started pulling more fluid yest w/ good response. Cont uF 100-200 cc/hr as tolerated for now.  Secondary HPTH: CorrCa/Phos ok - not getting binders here - follow for now. Nutrition: Alb low, continue supplements. A-fib T2DM   Myer Fret  MD  [facility] 10/14/2023, 3:11 PM  Recent Labs  Lab 10/13/23 0516 10/13/23 1600 10/14/23 0320  HGB 10.6*  --  10.6*  ALBUMIN  2.1* 2.1* 2.2*  CALCIUM  7.6* 7.3* 7.6*  PHOS 2.6 3.3 3.2  CREATININE 5.02* 4.04* 3.13*  K 4.3 4.5 4.8     Inpatient medications:  arformoterol  15 mcg Nebulization Q12H   atorvastatin   80 mg Per Tube QHS   budesonide (PULMICORT) nebulizer solution  0.5 mg Nebulization BID   Chlorhexidine  Gluconate Cloth  6 each Topical Q0600   clopidogrel   75 mg Per Tube Q breakfast   docusate  100 mg Per Tube BID   doxycycline   100 mg Per Tube Q12H   etomidate       etomidate  10 mg Intravenous STAT   famotidine   20 mg Per Tube Daily   feeding supplement (PROSource TF20)  60 mL Per Tube BID   hydrocortisone  sod succinate (SOLU-CORTEF ) inj  100 mg Intravenous Q12H   multivitamin  1 tablet Per Tube Daily   mouth rinse  15 mL Mouth Rinse Q2H   polyethylene glycol  17 g Per Tube Daily   revefenacin  175 mcg Nebulization Daily   sodium chloride  flush  10 mL Intrapleural Q8H   sodium chloride  flush  3 mL Intravenous Q12H   thiamine  100 mg Per Tube Daily    dexmedetomidine (PRECEDEX) IV infusion 0.4 mcg/kg/hr (10/14/23 1500)   feeding supplement (OSMOLITE 1.5 CAL) Stopped (  10/14/23 1230)   fentaNYL  infusion INTRAVENOUS 100 mcg/hr (10/14/23 1500)   heparin  800 Units/hr (10/14/23 1500)   insulin  2.4 Units/hr (10/14/23 1500)   norepinephrine  (LEVOPHED ) Adult infusion 8 mcg/min (10/14/23 1500)   piperacillin -tazobactam (ZOSYN )  IV Stopped (10/14/23 1233)   prismasol BGK 4/2.5 1,400 mL/hr at 10/14/23 1236   prismasol BGK 4/2.5 400 mL/hr at 10/14/23 0309   prismasol BGK 4/2.5 400 mL/hr at 10/14/23 0710   vasopressin  Stopped (10/14/23 1426)   acetaminophen  **OR** acetaminophen , dextrose , etomidate, fentaNYL , heparin , heparin , ipratropium-albuterol , ondansetron  (ZOFRAN ) IV, mouth rinse

## 2023-10-14 NOTE — Progress Notes (Signed)
 Pt placed on PS/CPAP mode but did not tolerate due to low Mve. Pt placed back on full support.

## 2023-10-14 NOTE — Procedures (Signed)
 Intubation Procedure Note  Anthony Bullock  [id number]  1960-08-14  Date:10/14/23  Time:3:23 PM   Provider Performing:Cassie Shedlock    Procedure: Intubation (31500)  Indication(s) Respiratory Failure  Consent Risks of the procedure as well as the alternatives and risks of each were explained to the patient and/or caregiver.  Consent for the procedure was obtained and is signed in the bedside chart   Anesthesia Etomidate and Rocuronium    Time Out Verified patient identification, verified procedure, site/side was marked, verified correct patient position, special equipment/implants available, medications/allergies/relevant history reviewed, required imaging and test results available.   Sterile Technique Usual hand hygeine, masks, and gloves were used   Procedure Description Patient positioned in bed supine.  Sedation given as noted above.  Patient was intubated with endotracheal tube using DL.  View was Grade 1 full glottis .  Number of attempts was 1.  Colorimetric CO2 detector was consistent with tracheal placement.   Complications/Tolerance None; patient tolerated the procedure well. Chest X-ray is ordered to verify placement.   EBL Minimal   Specimen(s) None

## 2023-10-14 NOTE — Progress Notes (Signed)
 This RT placed Pt on PS/CPAP at this time due to less sedation and Pt being more alert. Pt did not tolerate PS/CPAP due to low Mve. Pt placed back on full support for now.

## 2023-10-14 NOTE — Progress Notes (Signed)
 PHARMACY - ANTICOAGULATION CONSULT NOTE  Pharmacy Consult for heparin  Indication: atrial fibrillation  Allergies  Allergen Reactions   Epoetin  Alfa Other (See Comments)    Unknown    Ferumoxytol  Other (See Comments)    Unknown    Morphine Sulfate Rash and Other (See Comments)    Itches all over, red spots    Patient Measurements: Height: 5' 10 (177.8 cm) Weight: 76.7 kg (169 lb 1.5 oz) IBW/kg (Calculated) : 73 HEPARIN  DW (KG): 82.1  Vital Signs: Temp: 98.9 F (37.2 C) (10/04 0800) Temp Source: Axillary (10/04 0800) BP: 57/19 (10/04 0800) Pulse Rate: 57 (10/04 1100)  Labs: Recent Labs    10/11/23 1635 10/11/23 2024 10/12/23 0502 10/12/23 0753 10/12/23 0823 10/12/23 1029 10/12/23 1609 10/13/23 0516 10/13/23 1600 10/14/23 0320  HGB  --    < > 11.7*  --   --   --   --  10.6*  --  10.6*  HCT  --    < > 35.1*  --   --   --   --  31.4*  --  32.3*  PLT  --    < > 486*  --   --  411*  --  211  --  231  APTT  --    < >  --  187*  --  177*  --  47*  --  47*  LABPROT  --   --   --   --   --  29.9*  --   --   --  19.0*  INR  --   --   --   --   --  2.7*  --   --   --  1.5*  HEPARINUNFRC  --    < >  --  >1.10*  --   --   --  >1.10*  --  >1.10*  CREATININE 10.59*  --  11.17*  --   --   --    < > 5.02* 4.04* 3.13*  TROPONINIHS 1,127*  --   --   --  5,135* 5,169*  --   --   --   --    < > = values in this interval not displayed.    Estimated Creatinine Clearance: 25.3 mL/min (A) (by C-G formula based on SCr of 3.13 mg/dL (H)).  Assessment: Patient is a 63 year old admitted for septic shock requiring multiple pressors and pleural effusion, complicated by respiratory arrest. He has a history of atrial fibrillation on Eliquis  PTA. Last dose Eliquis  10/09/23 AM.  Pharmacy consulted to dose IV heparin . Heparin  held for biopsy 10/3, ok to restart 10/14 per team.    HL still >1.1 despite no apixaban  since 9/29. Will follow aPTT.  INR down to 1.5. Hgb stable. Patient's APTT stayed  high after bolus 10/1, will hold bolus. Note baseline aPTT after being off heparin  for 19 hours is still elevated at 47.   Goal of Therapy:  Heparin  level 0.3-0.7 units/ml aPTT 66-102 seconds Monitor platelets by anticoagulation protocol: Yes   Plan:  Heparin  800 units/hr F/u aPTT until correlates with heparin  level  F/u 8hr aPTT Monitor daily aPTT, heparin  level, CBC, signs/symptoms of bleeding   Jinnie Door, PharmD, BCPS, BCCP Clinical Pharmacist  Please check AMION for all [facility] phone numbers After 10:00 PM, call Main Pharmacy [phone]

## 2023-10-14 NOTE — Progress Notes (Signed)
 RT called by RN due to possible self extubation. Upon assessment end tidal did not confirm proper tube placement. ETT ultimately taken out by RT. CCM arrived and reintubated. ETT retracted back from 28 to 25( last well tube placement.)

## 2023-10-15 DIAGNOSIS — I469 Cardiac arrest, cause unspecified: Secondary | ICD-10-CM | POA: Diagnosis not present

## 2023-10-15 DIAGNOSIS — J90 Pleural effusion, not elsewhere classified: Secondary | ICD-10-CM | POA: Diagnosis not present

## 2023-10-15 DIAGNOSIS — Z992 Dependence on renal dialysis: Secondary | ICD-10-CM | POA: Diagnosis not present

## 2023-10-15 DIAGNOSIS — I5023 Acute on chronic systolic (congestive) heart failure: Secondary | ICD-10-CM | POA: Diagnosis not present

## 2023-10-15 DIAGNOSIS — Z515 Encounter for palliative care: Secondary | ICD-10-CM | POA: Diagnosis not present

## 2023-10-15 DIAGNOSIS — N186 End stage renal disease: Secondary | ICD-10-CM | POA: Diagnosis not present

## 2023-10-15 DIAGNOSIS — J9601 Acute respiratory failure with hypoxia: Secondary | ICD-10-CM | POA: Diagnosis not present

## 2023-10-15 DIAGNOSIS — Z7189 Other specified counseling: Secondary | ICD-10-CM | POA: Diagnosis not present

## 2023-10-15 LAB — MAGNESIUM: Magnesium: 2.5 mg/dL — ABNORMAL HIGH (ref 1.7–2.4)

## 2023-10-15 LAB — GLUCOSE, CAPILLARY
Glucose-Capillary: 168 mg/dL — ABNORMAL HIGH (ref 70–99)
Glucose-Capillary: 168 mg/dL — ABNORMAL HIGH (ref 70–99)
Glucose-Capillary: 169 mg/dL — ABNORMAL HIGH (ref 70–99)
Glucose-Capillary: 169 mg/dL — ABNORMAL HIGH (ref 70–99)
Glucose-Capillary: 169 mg/dL — ABNORMAL HIGH (ref 70–99)
Glucose-Capillary: 170 mg/dL — ABNORMAL HIGH (ref 70–99)
Glucose-Capillary: 171 mg/dL — ABNORMAL HIGH (ref 70–99)
Glucose-Capillary: 171 mg/dL — ABNORMAL HIGH (ref 70–99)
Glucose-Capillary: 171 mg/dL — ABNORMAL HIGH (ref 70–99)
Glucose-Capillary: 172 mg/dL — ABNORMAL HIGH (ref 70–99)
Glucose-Capillary: 173 mg/dL — ABNORMAL HIGH (ref 70–99)
Glucose-Capillary: 194 mg/dL — ABNORMAL HIGH (ref 70–99)
Glucose-Capillary: 239 mg/dL — ABNORMAL HIGH (ref 70–99)
Glucose-Capillary: 256 mg/dL — ABNORMAL HIGH (ref 70–99)

## 2023-10-15 LAB — RENAL FUNCTION PANEL
Albumin: 2.3 g/dL — ABNORMAL LOW (ref 3.5–5.0)
Albumin: 2.2 g/dL — ABNORMAL LOW (ref 3.5–5.0)
Anion gap: 11 (ref 5–15)
Anion gap: 13 (ref 5–15)
BUN: 21 mg/dL (ref 8–23)
BUN: 25 mg/dL — ABNORMAL HIGH (ref 8–23)
CO2: 22 mmol/L (ref 22–32)
CO2: 22 mmol/L (ref 22–32)
Calcium: 8.3 mg/dL — ABNORMAL LOW (ref 8.9–10.3)
Calcium: 8.3 mg/dL — ABNORMAL LOW (ref 8.9–10.3)
Chloride: 104 mmol/L (ref 98–111)
Chloride: 100 mmol/L (ref 98–111)
Creatinine, Ser: 2.09 mg/dL — ABNORMAL HIGH (ref 0.61–1.24)
Creatinine, Ser: 1.92 mg/dL — ABNORMAL HIGH (ref 0.61–1.24)
GFR, Estimated: 35 mL/min — ABNORMAL LOW (ref >60)
GFR, Estimated: 39 mL/min — ABNORMAL LOW (ref >60)
Glucose, Bld: 178 mg/dL — ABNORMAL HIGH (ref 70–99)
Glucose, Bld: 219 mg/dL — ABNORMAL HIGH (ref 70–99)
Phosphorus: 2.2 mg/dL — ABNORMAL LOW (ref 2.5–4.6)
Phosphorus: 3.2 mg/dL (ref 2.5–4.6)
Potassium: 4.2 mmol/L (ref 3.5–5.1)
Potassium: 4.6 mmol/L (ref 3.5–5.1)
Sodium: 137 mmol/L (ref 135–145)
Sodium: 135 mmol/L (ref 135–145)

## 2023-10-15 LAB — CBC
HCT: 36.8 % — ABNORMAL LOW (ref 39.0–52.0)
Hemoglobin: 12.1 g/dL — ABNORMAL LOW (ref 13.0–17.0)
MCH: 28.3 pg (ref 26.0–34.0)
MCHC: 32.9 g/dL (ref 30.0–36.0)
MCV: 86.2 fL (ref 80.0–100.0)
Platelets: 260 K/uL (ref 150–400)
RBC: 4.27 MIL/uL (ref 4.22–5.81)
RDW: 15.4 % (ref 11.5–15.5)
WBC: 32 K/uL — ABNORMAL HIGH (ref 4.0–10.5)
nRBC: 0 % (ref 0.0–0.2)

## 2023-10-15 LAB — BODY FLUID CULTURE W GRAM STAIN: Culture: NO GROWTH

## 2023-10-15 LAB — CULTURE, BLOOD (ROUTINE X 2)
Culture: NO GROWTH
Culture: NO GROWTH

## 2023-10-15 LAB — HEPARIN LEVEL (UNFRACTIONATED): Heparin Unfractionated: >1.1 [IU]/mL — ABNORMAL HIGH (ref 0.30–0.70)

## 2023-10-15 LAB — APTT: aPTT: 76 s — ABNORMAL HIGH (ref 24–36)

## 2023-10-15 MED ORDER — OSMOLITE 1.5 CAL PO LIQD
1000.0000 mL | ORAL | Status: DC
  Administered 2023-10-15 (×2): 1000 mL

## 2023-10-15 MED ORDER — INSULIN GLARGINE 100 UNIT/ML ~~LOC~~ SOLN
10.0000 [IU] | Freq: Two times a day (BID) | SUBCUTANEOUS | Status: DC
Start: 2023-10-15 — End: 2023-10-16
  Administered 2023-10-15 (×2): 10 [IU] via SUBCUTANEOUS
  Filled 2023-10-15 (×4): qty 0.1

## 2023-10-15 MED ORDER — POTASSIUM PHOSPHATES 15 MMOLE/5ML IV SOLN
15.0000 mmol | Freq: Once | INTRAVENOUS | Status: DC
  Filled 2023-10-15: qty 5

## 2023-10-15 MED ORDER — MIDODRINE HCL 5 MG PO TABS
10.0000 mg | ORAL_TABLET | Freq: Three times a day (TID) | ORAL | Status: DC
Start: 2023-10-15 — End: 2023-10-16
  Administered 2023-10-15 – 2023-10-16 (×3): 10 mg
  Filled 2023-10-15 (×3): qty 2

## 2023-10-15 MED ORDER — INSULIN ASPART 100 UNIT/ML IJ SOLN
0.0000 [IU] | INTRAMUSCULAR | Status: DC
  Administered 2023-10-15: 5 [IU] via SUBCUTANEOUS
  Administered 2023-10-15: 3 [IU] via SUBCUTANEOUS
  Administered 2023-10-15: 8 [IU] via SUBCUTANEOUS
  Administered 2023-10-15: 2 [IU] via SUBCUTANEOUS
  Administered 2023-10-16: 5 [IU] via SUBCUTANEOUS
  Administered 2023-10-16: 3 [IU] via SUBCUTANEOUS
  Administered 2023-10-16: 11 [IU] via SUBCUTANEOUS
  Administered 2023-10-16 (×2): 3 [IU] via SUBCUTANEOUS
  Administered 2023-10-16: 5 [IU] via SUBCUTANEOUS
  Administered 2023-10-17 – 2023-10-19 (×8): 2 [IU] via SUBCUTANEOUS

## 2023-10-15 NOTE — Progress Notes (Signed)
 [facility] Progress Note   Subjective: Remains on levo and vaso pressor support  CVP down 4-5 range Net neg 3.5 L yesterday  Objective Vitals:   10/15/23 0600 10/15/23 0754 10/15/23 0758 10/15/23 0837  BP:    (!) 84/35  Pulse: 65   65  Resp: (!) 24   (!) 24  Temp:  98.9 F (37.2 C)  99.6 F (37.6 C)  TempSrc:  Axillary  Axillary  SpO2: 100%  99% 100%  Weight:      Height:       Physical Exam Gen on vent, sedated Sclera anicteric, throat w/ ETT No jvd or bruits Chest clear anterior/ lateral RRR no MRG Abd soft ntnd no mass or ascites +bs Ext bilat BKA, no edema Neuro is on vent, sedated TDC in R chest   OP HD: TTS [facility] 4h  B400   82.5kg  1K bath  TDC  Hep none - Calcitriol  0.75mcg PO q HD - Cinacalcet  90mg  PO q HD   Assessment/Plan: R loc pleural effusion/ RLL consolidation vs atx: w/ extensive R pleural nodularity and possibly pathologic rib fx's. Prob PNA and possible malignancy. SP 1.8 L thora on 9/26, then 500 cc thora on 9/28. IR then did CT guided biopsy of R chest wall mass on 10/3.  Septic shock: vaso off, levo at 10-12 micrograms/min Cardiac arrest: on 10/01, ROSC approx 10 min.  ESRD: HD on TTS. Had HD x 1 on 9/30. CRRT started 10/02 due to shock. Cont CRRT.  Anemia of ESRD: Hgb 13. ESA would be contraindicated at this time in setting of presumed cancer Dx. Volume: CVP's were up to 15-20 and UF  was ^'d for the last 2-3 days. CVP now down to 5 and weights are sig down, so will change to keep even for now. Can do +40 cc/hr also if needed.   Secondary HPTH: CorrCa/Phos ok - not getting binders here - follow for now. Nutrition: Alb low, continue supplements. A-fib T2DM   Myer Fret  MD  [facility] 10/15/2023, 11:48 AM  Recent Labs  Lab 10/14/23 0320 10/14/23 1640 10/15/23 0407  HGB 10.6*  --  12.1*  ALBUMIN  2.2* 2.2* 2.3*  CALCIUM  7.6* 7.8* 8.3*  PHOS 3.2 2.2* 2.2*  CREATININE 3.13* 0.61 2.09*  K 4.8 3.9 4.2    Inpatient  medications:  arformoterol  15 mcg Nebulization Q12H   atorvastatin   80 mg Per Tube QHS   budesonide (PULMICORT) nebulizer solution  0.5 mg Nebulization BID   Chlorhexidine  Gluconate Cloth  6 each Topical Q0600   clopidogrel   75 mg Per Tube Q breakfast   docusate  100 mg Per Tube BID   doxycycline   100 mg Per Tube Q12H   etomidate  10 mg Intravenous STAT   famotidine   20 mg Per Tube Daily   feeding supplement (PROSource TF20)  60 mL Per Tube BID   hydrocortisone  sod succinate (SOLU-CORTEF ) inj  100 mg Intravenous Q12H   insulin  aspart  0-15 Units Subcutaneous Q4H   insulin  glargine  10 Units Subcutaneous BID   midodrine   10 mg Per Tube TID WC   multivitamin  1 tablet Per Tube Daily   mouth rinse  15 mL Mouth Rinse Q2H   polyethylene glycol  17 g Per Tube Daily   revefenacin  175 mcg Nebulization Daily   sodium chloride  flush  10 mL Intrapleural Q8H   sodium chloride  flush  3 mL Intravenous Q12H   thiamine  100 mg  Per Tube Daily    dexmedetomidine (PRECEDEX) IV infusion 0.4 mcg/kg/hr (10/15/23 1100)   feeding supplement (OSMOLITE 1.5 CAL) 20 mL/hr at 10/15/23 1100   fentaNYL  infusion INTRAVENOUS Stopped (10/15/23 1047)   heparin  800 Units/hr (10/15/23 1100)   insulin  Stopped (10/15/23 1039)   meropenem (MERREM) IV Stopped (10/15/23 9375)   norepinephrine  (LEVOPHED ) Adult infusion 12 mcg/min (10/15/23 1100)   prismasol BGK 4/2.5 1,400 mL/hr at 10/15/23 1038   prismasol BGK 4/2.5 400 mL/hr at 10/15/23 0850   prismasol BGK 4/2.5 400 mL/hr at 10/14/23 2008   acetaminophen  **OR** acetaminophen , dextrose , fentaNYL , heparin , heparin , ipratropium-albuterol , ondansetron  (ZOFRAN ) IV, mouth rinse

## 2023-10-15 NOTE — Progress Notes (Signed)
   10/15/23 0758  Daily Weaning Assessment  Daily Assessment of Readiness to Wean Wean protocol criteria met (SBT performed)  Reason not met Apnea  SBT Method CPAP 5 cm H20 and PS 5 cm H20 (PS 10)  Weaning Start Time 0758   Pt placed on PS/CPAP mode on ventilator but did not tolerate due to apnea. Pt placed back on full support for now.

## 2023-10-15 NOTE — Progress Notes (Signed)
 og  NAME:  Anthony Bullock, MRN:  [id number], DOB:  1960/10/15, LOS: 8 ADMISSION DATE:  10/06/2023, CONSULTATION DATE:  10/09/23 REFERRING MD:  Dr. Trixie, CHIEF COMPLAINT:  SOB    History of Present Illness:   70 yoM with PMH significant for COPD, ESRD TTS HD, HTN, HLD, HFrEF EF 40, HFpEF, PAF on Eliquis , CAD on plavix , DMT2, PVD s/p bilateral BKA, anemia, GIB who presented 9/26 with one month hx of SOB and intermittent right sided chest pain radiating into R shoulder.  No cough or fever. CXR performed while at HD 9/25 which showed right pleural effusion, recommended further evaluation and sent to ER.   Former smoker, quit 13 yrs ago.  Has 64 pack yr history.  Pt is not always forth coming with specific information, wife at bedside is helpful.  Pt reports no recent fever or chills, N/V.  Has had sinus congestion with yellow drainage and tint of blood for several days and as of yesterday having intermittent yellowish productive cough.  Does report exertional dyspnea and intermittent pleuritic pain with inspiration and R shoulder pain for at least 4 weeks.  Poor appetite over last week and reports weight loss, ?20lbs but on chart review, is down ~6kg since May.  Also, wife reports pt had fall with resultant right sided chest pain after hospitalization in May for pulmonary edema and April for GIB and s/p PCI to LAD/ RCA 04/12/23; pt did not seek evaluation after fall.  Also had fall last week after getting off lawn mower after prolonged use, denies hitting head. Wife reports it is not uncommon for him to fall due to weakness after HD and ongoing use of BLE prosthetics. On imaging review, pt has had moderate right pleural effusion since CXR in May 2025.  On admit, required 4L [place].  Afebrile and normal WBC/ differential. LFTs normal. Admitted to [facility].  Underwent IR thoracentesis on 9/26 with 1.85L of amber colored pleural fluid however was not sent for studies.  CXR 9/28 showed increasing R pleural effusion, tracking  laterally and underwent IR thoracentesis with of amber fluid which was sent for cell count and cytology.  Pt did report improvement in SOB after thoracentesis's and weaned to room air 9/28.  Chest CT w/ contrast performed after thoracentesis which showed extensive pleural nodularity in right chest, posterior right pleural nodule measures 4x1, with plaque-like nodular pleural thickening inferolaterally extending into right chest wall with pathologic fracture of anterior 5th and destruction of 6/7 ribs suspicious for malignancy, 17mm GGO in LUL, 5mm LLL pulmonary nodule, 13 mm paraesophageal lymph node, and RUL an RML volume loss with collapse/ consolidation in RLL with areas of loculated pleural fluid in right upper hemithorax.  Pleural fluid noted straw colored, LDH 660, neutrophil predominant, glucose 160, protein 5.5.  Pulmonary consulted for further recommendations.   On 10/1, Patient became hypotensive requiring Levophed .  Increasing WBC. Patient without complaints. Patient being followed by PCCM for pulmonary. Labs and scans reviewed-CT chest with contrast 9/28- not a PE protocol study, nodularity suggestive of possible malignancy, loculated large right pleural effusion.  Pertinent  Medical History  Former smoker, COPD, ESRD TTS HD, hypotension on midodrine , HTN, HLD, HFrEF, PAF, CAD, DMT2, peripheral neuropathy, PVD s/p bilateral BKA, anemia, GIB  Significant Hospital Events: Including procedures, antibiotic start and stop dates in addition to other pertinent events   9/26 admit, IR R thora 1.85 ml- no fluid sent 9/28 IR thora 520 ml, pleural studies sent 10/1 transferred to icu  with hypotension started on pressors and respiratory distress; brief cardiac arrest rosc 10 minutes; intubated 10/3: Remains on vaso and levo, on CRRT started pulling 50 cc an hour 10/4 patient remain on vasopressin  and norepinephrine , failed spontaneous breathing trial due to apnea and low minute  ventilation  Interim History / Subjective:  Yesterday afternoon patient self extubated after sedation was turned down, he became hypoxic had to be reintubated Initially vasopressor requirement went up but then later on it improved, currently on Levophed  around 10 mics, vasopressin  was titrated off Remain on CRRT with net -3.1 L and weight is down by 8 pounds Currently on Precedex and fentanyl   Objective    Blood pressure (!) 80/23, pulse 65, temperature 98.6 F (37 C), temperature source Axillary, resp. rate (!) 24, height 5' 10 (1.778 m), weight 72.8 kg, SpO2 100%. CVP:  [5 mmHg-13 mmHg] 8 mmHg  Vent Mode: PRVC FiO2 (%):  [40 %-100 %] 50 % Set Rate:  [24 bmp] 24 bmp Vt Set:  [580 mL] 580 mL PEEP:  [8 cmH20] 8 cmH20 Plateau Pressure:  [25 cmH20-27 cmH20] 26 cmH20   Intake/Output Summary (Last 24 hours) at 10/15/2023 0736 Last data filed at 10/15/2023 0700 Gross per 24 hour  Intake 2090.2 ml  Output 5213 ml  Net -3122.8 ml   Filed Weights   10/10/23 1915 10/14/23 0700 10/15/23 0500  Weight: 81.8 kg 76.7 kg 72.8 kg   Examination: General: Crtitically ill-appearing male, orally intubated HEENT: [place]/AT, eyes anicteric.  ETT and OGT in place Neuro: Sedated, not following commands.  Eyes are closed.  Pupils 3 mm bilateral reactive to light Chest: Reduced air entry at the bases bilaterally, no wheezes or rhonchi Heart: Regular rate and rhythm, no murmurs or gallops Abdomen: Soft, nondistended, bowel sounds present Extremities: Status post bilateral BKA   Chest tube output 190 cc  Labs and images reviewed  Patient Lines/Drains/Airways Status     Active Line/Drains/Airways     Name Placement date Placement time Site Days   Arterial Line 10/11/23 Right Femoral 10/11/23  1000  Femoral  4   Peripheral IV 10/06/23 20 G Anterior;Left Forearm 10/06/23  1419  Forearm  9   Peripheral IV 10/11/23 22 G 1.75 Left Forearm 10/11/23  0616  Forearm  4   CVC Triple Lumen 10/11/23 Left  Internal jugular 10/11/23  1000  -- 4   Hemodialysis Catheter Right Internal jugular Double-lumen;Permanent 12/28/15  1732  Internal jugular  2848   Chest Tube Lateral;Right Pleural 14 Fr. 10/11/23  0900  Pleural  4   Airway 7.5 mm 10/14/23  1235  -- 1   Small Bore Feeding Tube 10 Fr. Right nare Marking at nare/corner of mouth 73 cm 10/13/23  1106  Right nare  2   Wound 10/09/23 0800 Traumatic Elbow Anterior;Right 10/09/23  0800  Elbow  6   Wound 10/11/23 0522 Pressure Injury Buttocks Stage 1 -  Intact skin with non-blanchable redness of a localized area usually over a bony prominence. 10/11/23  0522  Buttocks  4         Resolved problem list   Assessment and Plan  Status post in hospital VT followed by PEA cardiac arrest in the setting of hypoxia Acute NSTEMI Acute on chronic combined systolic/diastolic heart failure Paroxysmal A-fib Coronary artery disease Shock, mixed cardiogenic and septic Acute septic encephalopathy Acute respiratory failure with hypoxia Recurrent loculated right-sided pleural effusion and status post chest tube placement Bilateral multifocal pneumonia with Pseudomonas Right-sided chest wall  mass status post CT-guided biopsy End-stage renal disease on CRRT Hypervolemic hyponatremia Diabetes type 2 with hyperglycemia  No more episodes of VT's, remained in sinus rhythm Cardiology has signed off Patient is not a candidate for cardiac cath Continue IV heparin  infusion Per nephrology will keep net even on CRRT He was net -3.1 L in last 24 hours with weight down by 8 pounds Remain in sinus rhythm Continue Plavix  and statin Shock state is improving, currently on Levophed  at 10 mics, once we decrease sedation hopefully that will improve Levophed  requirement Change Levophed  to SBP goal of 100-110 Added midodrine  10 mg 3 times daily per tube Minimize sedation, currently on 0.2 of Precedex and 50 of fentanyl  He is too sleepy for spontaneous breathing trial today,  once he wakes up after decreasing sedation, will try spontaneous breathing trial Chest tube output was 190 cc in last 24-hour, will remove chest tube once output is less than 100 cc Antibiotics switched to doxycycline  and meropenem in the setting of resistant Pseudomonas Follow-up path report from chest wall mass Continue CRRT Closely monitor electrolytes Transition insulin  to sliding scale and Lantus  with CBG goal 140-180   Labs   CBC: Recent Labs  Lab 10/11/23 0620 10/11/23 0931 10/11/23 0949 10/11/23 1325 10/12/23 0036 10/12/23 0502 10/12/23 1029 10/13/23 0516 10/14/23 0320 10/15/23 0407  WBC 27.1*  --  31.8*  --   --  31.5*  --  20.5* 24.9* 32.0*  NEUTROABS 23.0*  --   --   --   --   --   --   --   --   --   HGB 13.7   < > 12.8*   < > 11.6* 11.7*  --  10.6* 10.6* 12.1*  HCT 41.9   < > 38.4*   < > 34.6* 35.1*  --  31.4* 32.3* 36.8*  MCV 86.9  --  87.1  --   --  85.6  --  85.1 87.8 86.2  PLT 474*  --  470*  --   --  486* 411* 211 231 260   < > = values in this interval not displayed.    Basic Metabolic Panel: Recent Labs  Lab 10/11/23 0620 10/11/23 0931 10/11/23 0949 10/11/23 1635 10/13/23 0516 10/13/23 1600 10/14/23 0320 10/14/23 1640 10/15/23 0407  NA 131*   < > 131*   < > 137 135 136 136 137  K 4.5   < > 4.8   < > 4.3 4.5 4.8 3.9 4.2  CL 95*  --  92*   < > 103 101 105 102 104  CO2 19*  --  16*   < > 20* 19* 19* 21* 22  GLUCOSE 184*  --  284*   < > 119* 158* 189* 175* 178*  BUN 53*  --  58*   < > 32* 28* 25* 25* 21  CREATININE 10.18*  --  10.48*   < > 5.02* 4.04* 3.13* 0.61 2.09*  CALCIUM  7.8*  --  7.6*   < > 7.6* 7.3* 7.6* 7.8* 8.3*  MG 1.9  --  3.5*  --  2.8*  --  2.4  --  2.5*  PHOS  --   --   --    < > 2.6 3.3 3.2 2.2* 2.2*   < > = values in this interval not displayed.   GFR: Estimated Creatinine Clearance: 37.7 mL/min (A) (by C-G formula based on SCr of 2.09 mg/dL (H)). Recent Labs  Lab 10/10/23 0206  10/10/23 1626 10/11/23 0620 10/11/23 0940  10/11/23 0949 10/12/23 0502 10/12/23 0823 10/13/23 0516 10/14/23 0320 10/15/23 0407  PROCALCITON 2.96  --   --   --   --   --   --   --   --   --   WBC 19.7*   < > 27.1*  --    < > 31.5*  --  20.5* 24.9* 32.0*  LATICACIDVEN  --   --  1.8 3.1*  --   --  2.0*  --   --   --    < > = values in this interval not displayed.    Liver Function Tests: Recent Labs  Lab 10/11/23 0620 10/11/23 0949 10/11/23 1635 10/13/23 0516 10/13/23 1600 10/14/23 0320 10/14/23 1640 10/15/23 0407  AST 35 87*  --   --   --   --   --   --   ALT 25 52*  --   --   --   --   --   --   ALKPHOS 114 113  --   --   --   --   --   --   BILITOT 1.0 1.6*  --   --   --   --   --   --   PROT 6.5 6.5  --   --   --   --   --   --   ALBUMIN  1.9* 2.0*   < > 2.1* 2.1* 2.2* 2.2* 2.3*   < > = values in this interval not displayed.   No results for input(s): LIPASE, AMYLASE in the last 168 hours.  No results for input(s): AMMONIA in the last 168 hours.  ABG    Component Value Date/Time   PHART 7.319 (L) 10/11/2023 0931   PCO2ART 34.4 10/11/2023 0931   PO2ART 123 (H) 10/11/2023 0931   HCO3 17.7 (L) 10/11/2023 0931   TCO2 19 (L) 10/11/2023 0931   ACIDBASEDEF 8.0 (H) 10/11/2023 0931   O2SAT 72.9 10/12/2023 0926     Coagulation Profile: Recent Labs  Lab 10/12/23 1029 10/14/23 0320  INR 2.7* 1.5*    Cardiac Enzymes: No results for input(s): CKTOTAL, CKMB, CKMBINDEX, TROPONINI in the last 168 hours.  HbA1C: Hemoglobin A1C  Date/Time Value Ref Range Status  09/20/2023 02:10 PM 6.7 (A) 4.0 - 5.6 % Final  06/19/2023 01:57 PM 5.8 (A) 4.0 - 5.6 % Final  02/15/2018 12:00 AM 11.6  Final    Comment:    [facility] in [place]   Hgb A1c MFr Bld  Date/Time Value Ref Range Status  11/13/2019 04:43 PM 6.5 (H) 4.8 - 5.6 % Final    Comment:    (NOTE) Pre diabetes:          5.7%-6.4%  Diabetes:              >6.4%  Glycemic control for   <7.0% adults with diabetes   07/10/2019 04:44 AM 8.1 (H)  4.8 - 5.6 % Final    Comment:    (NOTE)         Prediabetes: 5.7 - 6.4         Diabetes: >6.4         Glycemic control for adults with diabetes: <7.0     CBG: Recent Labs  Lab 10/15/23 0258 10/15/23 0402 10/15/23 0458 10/15/23 0558 10/15/23 0657  GLUCAP 170* 172* 171* 168* 169*   The patient is critically ill due to septic shock/acute respiratory failure with  hypoxia.  Critical care was necessary to treat or prevent imminent or life-threatening deterioration.  Critical care was time spent personally by me on the following activities: development of treatment plan with patient and/or surrogate as well as nursing, discussions with consultants, evaluation of patient's response to treatment, examination of patient, obtaining history from patient or surrogate, ordering and performing treatments and interventions, ordering and review of laboratory studies, ordering and review of radiographic studies, pulse oximetry, re-evaluation of patient's condition and participation in multidisciplinary rounds.   During this encounter critical care time was devoted to patient care services described in this note for 40 minutes.     Valinda Novas, MD [facility] See Amion for pager If no response to pager, please call [phone] until 7pm After 7pm, Please call E-link [phone]

## 2023-10-15 NOTE — Progress Notes (Signed)
 PHARMACY - ANTICOAGULATION CONSULT NOTE  Pharmacy Consult for heparin  Indication: atrial fibrillation  Allergies  Allergen Reactions   Epoetin  Alfa Other (See Comments)    Unknown    Ferumoxytol  Other (See Comments)    Unknown    Morphine Sulfate Rash and Other (See Comments)    Itches all over, red spots    Patient Measurements: Height: 5' 10 (177.8 cm) Weight: 76.7 kg (169 lb 1.5 oz) IBW/kg (Calculated) : 73 HEPARIN  DW (KG): 82.1  Vital Signs: Temp: 98.6 F (37 C) (10/05 0400) Temp Source: Axillary (10/05 0400) Pulse Rate: 61 (10/05 0400)  Labs: Recent Labs    10/12/23 0753 10/12/23 0823 10/12/23 1029 10/12/23 1609 10/13/23 0516 10/13/23 1600 10/14/23 0320 10/14/23 1640 10/14/23 1935 10/15/23 0407  HGB  --   --   --    < > 10.6*  --  10.6*  --   --  12.1*  HCT  --   --   --   --  31.4*  --  32.3*  --   --  36.8*  PLT   < >  --  411*  --  211  --  231  --   --  260  APTT  --   --  177*  --  47*  --  47*  --  101* 76*  LABPROT  --   --  29.9*  --   --   --  19.0*  --   --   --   INR  --   --  2.7*  --   --   --  1.5*  --   --   --   HEPARINUNFRC  --   --   --   --  >1.10*  --  >1.10*  --   --  >1.10*  CREATININE  --   --   --    < > 5.02*   < > 3.13* 0.61  --  2.09*  TROPONINIHS  --  5,135* 5,169*  --   --   --   --   --   --   --    < > = values in this interval not displayed.    Estimated Creatinine Clearance: 37.8 mL/min (A) (by C-G formula based on SCr of 2.09 mg/dL (H)).  Assessment: Patient is a 63 year old admitted for septic shock requiring multiple pressors and pleural effusion, complicated by respiratory arrest. He has a history of atrial fibrillation on Eliquis  PTA. Last dose Eliquis  10/09/23 AM.  Pharmacy consulted to dose IV heparin . Heparin  held for biopsy 10/3, ok to restart 10/14 per team.    HL still >1.1 despite no apixaban  since 9/29. Will follow aPTT.  INR down to 1.5. Hgb stable. Patient's APTT stayed high after bolus 10/1, will hold  bolus. Note baseline aPTT after being off heparin  for 19 hours is still elevated at 47.   10/5 AM update:  aPTT therapeutic x 2  Goal of Therapy:  Heparin  level 0.3-0.7 units/ml aPTT 66-102 seconds Monitor platelets by anticoagulation protocol: Yes   Plan:  Cont Heparin  800 units/hr Daily CBC, aPTT, and heparin  level Monitor for bleeding  Lynwood Mckusick, PharmD, BCPS Clinical Pharmacist Phone: [phone]

## 2023-10-15 NOTE — Progress Notes (Signed)
 Palliative Medicine Inpatient Follow Up Note   HPI: 63 y.o. male  with past medical history of HTN, HLD, chronic systolic CHF, paroxysmal atrial fibrillation, CAD ESRD on HD (TTS), DM type II, COPD, PVD s/p bilateral BKA, chronic anemia, and GI bleed  admitted on 10/06/2023 with shortness of breath and right-sided chest pain. [place] xray revealed large right-sided pleural effusion.    During this hospital stay, he underwent an IR-guided right thoracentesis with 1.85 mL removed; no fluid was sent for analysis. A subsequent thoracentesis on 9/28 yielded 520 mL of fluid, which was sent for pleural studies. On 10/1, the patient was transferred to the ICU due to hypotension and respiratory distress, requiring initiation of vasopressors. The clinical course was complicated by a brief cardiac arrest with ROSC after 10 minutes, and the patient was subsequently intubated.   Patient has had 4 hospital admissions over the last 6 months.    PMT has been consulted to assist with goal.   Today's Discussion 10/15/2023  *Please note that this is a verbal dictation therefore any spelling or grammatical errors are due to the Dragon Medical One system interpretation.  Chart reviewed inclusive of vital signs, progress notes, laboratory results, and diagnostic images.   Visited the patient at bedside today. He remains intubated and sedated, with a critically ill appearance. Present at the bedside were his wife, Devere, and a church friend. Devere shared that she remains hopeful for clinical improvement. She noted that the patient has been responsive to her questions today, communicating through nodding.  Received report from ICU RN. No acute concerns were raised. The patient is currently being weaned off sedation, receiving Precedex at 0.4 mcg/kg/hr and fentanyl  at 50 mcg/hr. Encouragingly, he has been breathing spontaneously with minimal ventilator support for the past two hours. The team will continue with the  spontaneous breathing trial to assess his respiratory function.  The goals of care and treatment plan remain unchanged at this time, as Devere continues to express hope for recovery. Current medical management will be maintained to allow time for potential improvement.  During the visit, space was created to support the spouse  in processing his thoughts, emotions, and fears related to patient's medical condition. The family is navigating complex decisions regarding treatment options, advance directives, and anticipatory care planning. Ongoing support and communication will be essential as the clinical situation evolves.  Current medications reviewed:  Precedex 0.4mcg/kg/hr Fentanyl  50 mcg/hr Heparin  800 units/hr Levophed  13.1 mcg/min  Questions and concerns addressed   Palliative Support Provided.   Objective Assessment: Vital Signs Vitals:   10/15/23 0758 10/15/23 0837  BP:  (!) 84/35  Pulse:  65  Resp:  (!) 24  Temp:  99.6 F (37.6 C)  SpO2: 99% 100%    Intake/Output Summary (Last 24 hours) at 10/15/2023 1346 Last data filed at 10/15/2023 1300 Gross per 24 hour  Intake 1665.53 ml  Output 3900.6 ml  Net -2235.07 ml   Last Weight  Most recent update: 10/15/2023  6:11 AM    Weight  72.8 kg (160 lb 7.9 oz)             General: Crtitically ill-appearing male, orally intubated HEENT: [place]/AT, eyes anicteric.  ETT and OGT in place Neuro: Sedated, not following commands.  Eyes are closed.  Pupils 3 mm bilateral reactive to light Chest: Reduced air entry at the bases bilaterally, no wheezes or rhonchi Heart: Regular rate and rhythm, no murmurs or gallops Abdomen: Soft, nondistended, bowel sounds present Extremities:  Status post bilateral BKA  SUMMARY OF RECOMMENDATIONS   Code Status: Maintain Full code Continue current scope of care Goal of care is medical stabilization and recovery to the extent this is possible, allow time for outcomes Continue to provide psycho-social  and emotional support to patient and family Palliative medicine team will continue to follow.    Symptom Management: Per ICU team Palliative medicine is available to assist as needed.    Time Spent: 50 minutes  Detailed review of medical records (labs, imaging, vital signs), medically appropriate exam, discussed with treatment team, counseling and education to patient, family, & staff, documenting clinical information, coordination of care.      ______________________________________________________________________________________ Kathlyne Bolder NP-[facility] Palliative [facility] Team Team Cell Phone: [phone] Please utilize secure chat with additional questions, if there is no response within 30 minutes please call the above phone number  Palliative Medicine Team providers are available by phone from 7am to 7pm daily and can be reached through the team cell phone.  Should this patient require assistance outside of these hours, please call the patient's attending physician.

## 2023-10-15 NOTE — Progress Notes (Signed)
 Per RN Pt was placed on PS/CPAP mode 10/5 and did not tolerate due to apnea. Pt placed back on previous settings

## 2023-10-16 ENCOUNTER — Inpatient Hospital Stay (HOSPITAL_COMMUNITY)

## 2023-10-16 DIAGNOSIS — I469 Cardiac arrest, cause unspecified: Secondary | ICD-10-CM | POA: Diagnosis not present

## 2023-10-16 DIAGNOSIS — N186 End stage renal disease: Secondary | ICD-10-CM | POA: Diagnosis not present

## 2023-10-16 DIAGNOSIS — Z992 Dependence on renal dialysis: Secondary | ICD-10-CM | POA: Diagnosis not present

## 2023-10-16 DIAGNOSIS — J189 Pneumonia, unspecified organism: Secondary | ICD-10-CM

## 2023-10-16 DIAGNOSIS — J9601 Acute respiratory failure with hypoxia: Secondary | ICD-10-CM | POA: Diagnosis not present

## 2023-10-16 DIAGNOSIS — G9341 Metabolic encephalopathy: Secondary | ICD-10-CM

## 2023-10-16 LAB — POCT I-STAT 7, (LYTES, BLD GAS, ICA,H+H)
Acid-Base Excess: 4 mmol/L — ABNORMAL HIGH (ref 0.0–2.0)
Acid-base deficit: 7 mmol/L — ABNORMAL HIGH (ref 0.0–2.0)
Bicarbonate: 22 mmol/L (ref 20.0–28.0)
Bicarbonate: 27 mmol/L (ref 20.0–28.0)
Calcium, Ion: 1.21 mmol/L (ref 1.15–1.40)
Calcium, Ion: 1.11 mmol/L — ABNORMAL LOW (ref 1.15–1.40)
HCT: 41 % (ref 39.0–52.0)
HCT: 36 % — ABNORMAL LOW (ref 39.0–52.0)
Hemoglobin: 13.9 g/dL (ref 13.0–17.0)
Hemoglobin: 12.2 g/dL — ABNORMAL LOW (ref 13.0–17.0)
O2 Saturation: 92 %
O2 Saturation: 100 %
Patient temperature: 97.7
Patient temperature: 98.2
Potassium: 5.1 mmol/L (ref 3.5–5.1)
Potassium: 4.1 mmol/L (ref 3.5–5.1)
Sodium: 137 mmol/L (ref 135–145)
Sodium: 138 mmol/L (ref 135–145)
TCO2: 24 mmol/L (ref 22–32)
TCO2: 28 mmol/L (ref 22–32)
pCO2 arterial: 54.7 mmHg — ABNORMAL HIGH (ref 32–48)
pCO2 arterial: 34.3 mmHg (ref 32–48)
pH, Arterial: 7.209 — ABNORMAL LOW (ref 7.35–7.45)
pH, Arterial: 7.503 — ABNORMAL HIGH (ref 7.35–7.45)
pO2, Arterial: 77 mmHg — ABNORMAL LOW (ref 83–108)
pO2, Arterial: 302 mmHg — ABNORMAL HIGH (ref 83–108)

## 2023-10-16 LAB — COMPREHENSIVE METABOLIC PANEL WITH GFR
ALT: 29 U/L (ref 0–44)
AST: 45 U/L — ABNORMAL HIGH (ref 15–41)
Albumin: 1.9 g/dL — ABNORMAL LOW (ref 3.5–5.0)
Alkaline Phosphatase: 126 U/L (ref 38–126)
Anion gap: 11 (ref 5–15)
BUN: 32 mg/dL — ABNORMAL HIGH (ref 8–23)
CO2: 22 mmol/L (ref 22–32)
Calcium: 7.9 mg/dL — ABNORMAL LOW (ref 8.9–10.3)
Chloride: 103 mmol/L (ref 98–111)
Creatinine, Ser: 1.67 mg/dL — ABNORMAL HIGH (ref 0.61–1.24)
GFR, Estimated: 46 mL/min — ABNORMAL LOW (ref >60)
Glucose, Bld: 292 mg/dL — ABNORMAL HIGH (ref 70–99)
Potassium: 4.4 mmol/L (ref 3.5–5.1)
Sodium: 136 mmol/L (ref 135–145)
Total Bilirubin: 0.8 mg/dL (ref 0.0–1.2)
Total Protein: 6.4 g/dL — ABNORMAL LOW (ref 6.5–8.1)

## 2023-10-16 LAB — MAGNESIUM
Magnesium: 2.6 mg/dL — ABNORMAL HIGH (ref 1.7–2.4)
Magnesium: 2.4 mg/dL (ref 1.7–2.4)

## 2023-10-16 LAB — RENAL FUNCTION PANEL
Albumin: 2.2 g/dL — ABNORMAL LOW (ref 3.5–5.0)
Albumin: 2.1 g/dL — ABNORMAL LOW (ref 3.5–5.0)
Anion gap: 11 (ref 5–15)
Anion gap: 12 (ref 5–15)
BUN: 27 mg/dL — ABNORMAL HIGH (ref 8–23)
BUN: 33 mg/dL — ABNORMAL HIGH (ref 8–23)
CO2: 21 mmol/L — ABNORMAL LOW (ref 22–32)
CO2: 21 mmol/L — ABNORMAL LOW (ref 22–32)
Calcium: 8.5 mg/dL — ABNORMAL LOW (ref 8.9–10.3)
Calcium: 8.2 mg/dL — ABNORMAL LOW (ref 8.9–10.3)
Chloride: 103 mmol/L (ref 98–111)
Chloride: 101 mmol/L (ref 98–111)
Creatinine, Ser: 1.66 mg/dL — ABNORMAL HIGH (ref 0.61–1.24)
Creatinine, Ser: 1.7 mg/dL — ABNORMAL HIGH (ref 0.61–1.24)
GFR, Estimated: 46 mL/min — ABNORMAL LOW (ref >60)
GFR, Estimated: 45 mL/min — ABNORMAL LOW (ref >60)
Glucose, Bld: 225 mg/dL — ABNORMAL HIGH (ref 70–99)
Glucose, Bld: 345 mg/dL — ABNORMAL HIGH (ref 70–99)
Phosphorus: 2.2 mg/dL — ABNORMAL LOW (ref 2.5–4.6)
Phosphorus: 2.9 mg/dL (ref 2.5–4.6)
Potassium: 4.2 mmol/L (ref 3.5–5.1)
Potassium: 4.5 mmol/L (ref 3.5–5.1)
Sodium: 135 mmol/L (ref 135–145)
Sodium: 134 mmol/L — ABNORMAL LOW (ref 135–145)

## 2023-10-16 LAB — CBC
HCT: 38.4 % — ABNORMAL LOW (ref 39.0–52.0)
Hemoglobin: 12.1 g/dL — ABNORMAL LOW (ref 13.0–17.0)
MCH: 28.3 pg (ref 26.0–34.0)
MCHC: 31.5 g/dL (ref 30.0–36.0)
MCV: 89.9 fL (ref 80.0–100.0)
Platelets: 267 K/uL (ref 150–400)
RBC: 4.27 MIL/uL (ref 4.22–5.81)
RDW: 15.7 % — ABNORMAL HIGH (ref 11.5–15.5)
WBC: 34.2 K/uL — ABNORMAL HIGH (ref 4.0–10.5)
nRBC: 0.1 % (ref 0.0–0.2)

## 2023-10-16 LAB — LACTIC ACID, PLASMA
Lactic Acid, Venous: 3.3 mmol/L (ref 0.5–1.9)
Lactic Acid, Venous: 1.4 mmol/L (ref 0.5–1.9)

## 2023-10-16 LAB — HEPARIN LEVEL (UNFRACTIONATED): Heparin Unfractionated: >1.1 [IU]/mL — ABNORMAL HIGH (ref 0.30–0.70)

## 2023-10-16 LAB — GLUCOSE, CAPILLARY
Glucose-Capillary: 161 mg/dL — ABNORMAL HIGH (ref 70–99)
Glucose-Capillary: 173 mg/dL — ABNORMAL HIGH (ref 70–99)
Glucose-Capillary: 206 mg/dL — ABNORMAL HIGH (ref 70–99)
Glucose-Capillary: 227 mg/dL — ABNORMAL HIGH (ref 70–99)
Glucose-Capillary: 273 mg/dL — ABNORMAL HIGH (ref 70–99)
Glucose-Capillary: 329 mg/dL — ABNORMAL HIGH (ref 70–99)

## 2023-10-16 LAB — APTT
aPTT: 126 s — ABNORMAL HIGH (ref 24–36)
aPTT: 82 s — ABNORMAL HIGH (ref 24–36)

## 2023-10-16 LAB — PHOSPHORUS: Phosphorus: 4 mg/dL (ref 2.5–4.6)

## 2023-10-16 MED ORDER — FENTANYL 2500MCG IN NS 250ML (10MCG/ML) PREMIX INFUSION
0.0000 ug/h | INTRAVENOUS | Status: DC
  Administered 2023-10-17: 75 ug/h via INTRAVENOUS
  Filled 2023-10-16: qty 250

## 2023-10-16 MED ORDER — FENTANYL CITRATE PF 50 MCG/ML IJ SOSY: 25.0000 ug | PREFILLED_SYRINGE | Freq: Once | INTRAMUSCULAR | Status: DC

## 2023-10-16 MED ORDER — EPINEPHRINE HCL 5 MG/250ML IV SOLN IN NS
INTRAVENOUS | Status: AC
  Filled 2023-10-16: qty 250

## 2023-10-16 MED ORDER — PHENYLEPHRINE 80 MCG/ML (10ML) SYRINGE FOR IV PUSH (FOR BLOOD PRESSURE SUPPORT)
80.0000 ug | PREFILLED_SYRINGE | Freq: Once | INTRAVENOUS | Status: AC | PRN
  Administered 2023-10-16: 200 ug via INTRAVENOUS

## 2023-10-16 MED ORDER — INSULIN GLARGINE 100 UNIT/ML ~~LOC~~ SOLN
15.0000 [IU] | Freq: Two times a day (BID) | SUBCUTANEOUS | Status: DC
  Administered 2023-10-16 – 2023-10-17 (×3): 15 [IU] via SUBCUTANEOUS
  Filled 2023-10-16 (×6): qty 0.15

## 2023-10-16 MED ORDER — FENTANYL BOLUS VIA INFUSION
25.0000 ug | INTRAVENOUS | Status: DC | PRN
  Administered 2023-10-17 – 2023-10-18 (×2): 25 ug via INTRAVENOUS

## 2023-10-16 MED ORDER — ETOMIDATE 2 MG/ML IV SOLN
10.0000 mg | Freq: Once | INTRAVENOUS | Status: AC
  Administered 2023-10-16: 10 mg via INTRAVENOUS

## 2023-10-16 MED ORDER — DOCUSATE SODIUM 50 MG/5ML PO LIQD: 100.0000 mg | Freq: Two times a day (BID) | ORAL | Status: DC

## 2023-10-16 MED ORDER — LACTATED RINGERS IV BOLUS
1000.0000 mL | Freq: Once | INTRAVENOUS | Status: AC
  Administered 2023-10-16: 1000 mL via INTRAVENOUS

## 2023-10-16 MED ORDER — SUCCINYLCHOLINE CHLORIDE 200 MG/10ML IV SOSY
PREFILLED_SYRINGE | INTRAVENOUS | Status: AC
  Filled 2023-10-16: qty 10

## 2023-10-16 MED ORDER — PANTOPRAZOLE SODIUM 40 MG IV SOLR
40.0000 mg | Freq: Every day | INTRAVENOUS | Status: DC
  Administered 2023-10-16 – 2023-10-19 (×4): 40 mg via INTRAVENOUS
  Filled 2023-10-16 (×4): qty 10

## 2023-10-16 MED ORDER — MIDODRINE HCL 5 MG PO TABS
20.0000 mg | ORAL_TABLET | Freq: Three times a day (TID) | ORAL | Status: DC
  Administered 2023-10-16: 20 mg
  Filled 2023-10-16: qty 4

## 2023-10-16 MED ORDER — POLYETHYLENE GLYCOL 3350 17 G PO PACK
17.0000 g | PACK | Freq: Every day | ORAL | Status: DC
  Administered 2023-10-16 – 2023-10-17 (×2): 17 g
  Filled 2023-10-16 (×2): qty 1

## 2023-10-16 MED ORDER — ROCURONIUM BROMIDE 10 MG/ML (PF) SYRINGE
PREFILLED_SYRINGE | INTRAVENOUS | Status: AC
  Filled 2023-10-16: qty 10

## 2023-10-16 MED ORDER — SODIUM BICARBONATE 8.4 % IV SOLN
INTRAVENOUS | Status: AC
  Administered 2023-10-16: 50 meq
  Filled 2023-10-16: qty 50

## 2023-10-16 MED ORDER — PHENYLEPHRINE 80 MCG/ML (10ML) SYRINGE FOR IV PUSH (FOR BLOOD PRESSURE SUPPORT)
PREFILLED_SYRINGE | INTRAVENOUS | Status: AC
  Filled 2023-10-16: qty 10

## 2023-10-16 MED ORDER — ETOMIDATE 2 MG/ML IV SOLN
INTRAVENOUS | Status: AC
  Filled 2023-10-16: qty 20

## 2023-10-16 MED ORDER — MIDODRINE HCL 5 MG PO TABS
10.0000 mg | ORAL_TABLET | Freq: Once | ORAL | Status: AC
  Administered 2023-10-16: 10 mg via ORAL
  Filled 2023-10-16: qty 2

## 2023-10-16 MED ORDER — OSMOLITE 1.5 CAL PO LIQD
1000.0000 mL | ORAL | Status: DC
Start: 2023-10-16 — End: 2023-10-20

## 2023-10-16 MED ORDER — VASOPRESSIN 20 UNITS/100 ML INFUSION FOR SHOCK
0.0400 [IU]/min | INTRAVENOUS | Status: DC
  Administered 2023-10-16: 0.04 [IU]/min via INTRAVENOUS

## 2023-10-16 MED ORDER — DEXMEDETOMIDINE HCL IN NACL 400 MCG/100ML IV SOLN
0.0000 ug/kg/h | INTRAVENOUS | Status: DC
  Administered 2023-10-16: 0.3 ug/kg/h via INTRAVENOUS
  Administered 2023-10-17 (×2): 0.5 ug/kg/h via INTRAVENOUS
  Administered 2023-10-18: 0.6 ug/kg/h via INTRAVENOUS
  Administered 2023-10-19: 0.3 ug/kg/h via INTRAVENOUS
  Filled 2023-10-16 (×2): qty 100
  Filled 2023-10-16: qty 200

## 2023-10-16 MED ORDER — SODIUM BICARBONATE 8.4 % IV SOLN: 50.0000 meq | Freq: Once | INTRAVENOUS | Status: AC

## 2023-10-16 MED ORDER — POTASSIUM & SODIUM PHOSPHATES 280-160-250 MG PO PACK
2.0000 | PACK | Freq: Once | ORAL | Status: AC
  Administered 2023-10-16: 2
  Filled 2023-10-16: qty 2

## 2023-10-16 MED ORDER — MIDAZOLAM HCL 2 MG/2ML IJ SOLN
INTRAMUSCULAR | Status: AC
  Filled 2023-10-16: qty 2

## 2023-10-16 MED ORDER — FENTANYL CITRATE PF 50 MCG/ML IJ SOSY
PREFILLED_SYRINGE | INTRAMUSCULAR | Status: AC
  Filled 2023-10-16: qty 2

## 2023-10-16 MED ORDER — VASOPRESSIN 20 UNITS/100 ML INFUSION FOR SHOCK
INTRAVENOUS | Status: AC
  Filled 2023-10-16: qty 100

## 2023-10-16 MED ORDER — MIDODRINE HCL 5 MG PO TABS
20.0000 mg | ORAL_TABLET | Freq: Three times a day (TID) | ORAL | Status: DC
  Administered 2023-10-16 – 2023-10-19 (×9): 20 mg
  Filled 2023-10-16 (×9): qty 4

## 2023-10-16 MED ORDER — EPINEPHRINE HCL 5 MG/250ML IV SOLN IN NS
0.5000 ug/min | INTRAVENOUS | Status: DC
  Administered 2023-10-16: 1 ug/min via INTRAVENOUS

## 2023-10-16 NOTE — Progress Notes (Signed)
 eLink Physician-Brief Progress Note Patient Name: DEYON CHIZEK DOB: May 29, 1960 MRN: [id number]   Date of Service  10/16/2023  HPI/Events of Note  63 year old male with end-stage renal disease on hemodialysis, chronic combined HFrEF and HFpEF who initially presented with acute respiratory failure with hypoxia in the setting of recurrent pleural effusion, complicated with V. tach followed by PEA cardiac arrest   Had a rapid change since shift change, pulled out his core track and subsequently aspirated on tube feeds.  Progressively became more somnolent, hypotensive with rapidly escalating vasopressor requirements from norepinephrine  of 6 up to norepinephrine  at 55 through a port.  Radiograph performed with relatively stable findings.  ABG reviewed.  eICU Interventions  Immediately initiated vasopressin , epinephrine  in addition given rapid escalation.  1 L LR bolus.  Referred for ground team evaluation for potential intubation.  Family is on the way  Currently on CRRT     Intervention Category Major Interventions: Respiratory failure - evaluation and management  Malya Cirillo 10/16/2023, 7:37 PM

## 2023-10-16 NOTE — Progress Notes (Signed)
 PHARMACY - ANTICOAGULATION CONSULT NOTE  Pharmacy Consult for heparin  Indication: atrial fibrillation  Allergies  Allergen Reactions   Epoetin  Alfa Other (See Comments)    Unknown    Ferumoxytol  Other (See Comments)    Unknown    Morphine Sulfate Rash and Other (See Comments)    Itches all over, red spots    Patient Measurements: Height: 5' 10 (177.8 cm) Weight: 71.7 kg (158 lb 1.1 oz) IBW/kg (Calculated) : 73 HEPARIN  DW (KG): 82.1  Vital Signs: Temp: 98.2 F (36.8 C) (10/06 0800) Temp Source: Axillary (10/06 0800) BP: 114/75 (10/06 1100) Pulse Rate: 66 (10/06 1130)  Labs: Recent Labs    10/14/23 0320 10/14/23 1640 10/14/23 1935 10/15/23 0407 10/15/23 1557 10/16/23 0507  HGB 10.6*  --   --  12.1*  --   --   HCT 32.3*  --   --  36.8*  --   --   PLT 231  --   --  260  --   --   APTT 47*  --  101* 76*  --  126*  LABPROT 19.0*  --   --   --   --   --   INR 1.5*  --   --   --   --   --   HEPARINUNFRC >1.10*  --   --  >1.10*  --  >1.10*  CREATININE 3.13*   < >  --  2.09* 1.92* 1.66*   < > = values in this interval not displayed.    Estimated Creatinine Clearance: 46.8 mL/min (A) (by C-G formula based on SCr of 1.66 mg/dL (H)).  Assessment: Patient is a 63 year old admitted for septic shock requiring multiple pressors and pleural effusion, complicated by respiratory arrest. He has a history of atrial fibrillation on Eliquis  PTA. Last dose Eliquis  10/09/23 AM.  Pharmacy consulted to dose IV heparin . Heparin  held for biopsy 10/3, ok to restart 10/14 per team.    HL still >1.1 despite no apixaban  since 9/29. Will follow aPTT.  Last INR 10/3 decreased to 1.5. Last CBC 10/4 stable. aPTT supratherapeutic at 126. No overnight issues or concerns per RN.  Goal of Therapy:  Heparin  level 0.3-0.7 units/ml aPTT 66-102 seconds Monitor platelets by anticoagulation protocol: Yes   Plan:  Decrease Heparin  to 650 units/hr aPTT in 8 hours Daily CBC, aPTT, and heparin   level Monitor for bleeding  Vermell Mccallum, PharmD

## 2023-10-16 NOTE — Progress Notes (Signed)
 Came into this pt room right around 1905, after assisting another pt, to find CRRT  and TF machines alarming. Noted also that pt had pulled coretrak feeding tube out to the marking of 20 , bridle still intact. Bridle was cut and remaining coretrak tubing removed, all tubing intact upon removal inspection. Oral suctioning performed, placed NRB O2 mask.  Pt SBP dropped significantly from previous trending within goal limits, increased current infusing Vaso, reached out to e-link PCCM provider, ordered stat chest x-ray, obtained ABG, and reached out to pt wife who stated she was enroute currently. Continued measures with e-link provider and PCCM ground team upon their arrival.

## 2023-10-16 NOTE — Procedures (Signed)
 Intubation Procedure Note  Anthony Bullock  [id number]  03-08-1960  Date:10/16/23  Time:8:24 PM   Provider Performing:Yossi Hinchman Maree    Procedure: Intubation (31500)  Indication(s) Respiratory Failure Patient altered, may have aspirated. Hypotensive despite being on  multiple pressors  Consent Unable to obtain consent due to emergent nature of procedure.   Anesthesia Etomidate   Time Out Verified patient identification, verified procedure, site/side was marked, verified correct patient position, special equipment/implants available, medications/allergies/relevant history reviewed, required imaging and test results available.   Sterile Technique Usual hand hygeine, masks, and gloves were used   Procedure Description Patient positioned in bed supine.  Sedation given as noted above.  Patient was intubated with endotracheal tube using Glidescope.  View was Grade 1 full glottis .  Number of attempts was 1.  Colorimetric CO2 detector was consistent with tracheal placement.   Complications/Tolerance None; patient tolerated the procedure well. Chest X-ray is ordered to verify placement.   EBL 0   Specimen(s) None

## 2023-10-16 NOTE — Progress Notes (Signed)
 [facility] Progress Note   Subjective:  Seen in room.  Continues to require fluctuating doses of vasopressor support.  On PS trial now.  Objective Vitals:   10/16/23 1100 10/16/23 1115 10/16/23 1128 10/16/23 1130  BP: 114/75     Pulse: 65 69 66 66  Resp: 13 20 17 10   Temp:      TempSrc:      SpO2: 99% 99% 98% 99%  Weight:      Height:       Physical Exam Gen on vent, sedated Sclera anicteric, throat w/ ETT No jvd or bruits Chest clear anterior/ lateral RRR no MRG Abd soft ntnd no mass or ascites +bs Ext bilat BKA, no edema Neuro is on vent, sedated TDC in R chest   OP HD: TTS [facility] 4h  B400   82.5kg  1K bath  TDC  Hep none - Calcitriol  0.67mcg PO q HD - Cinacalcet  90mg  PO q HD   Assessment/Plan: R loc pleural effusion/ RLL consolidation vs atx: w/ extensive R pleural nodularity and possibly pathologic rib fx's. Prob PNA and possible malignancy. SP 1.8 L thora on 9/26, then 500 cc thora on 9/28. IR then did CT guided biopsy of R chest wall mass on 10/3.  Shock: continues to require sig pressors Cardiac arrest: on 10/01, ROSC approx 10 min.  ESRD: HD on TTS. Had HD x 1 on 9/30. CRRT started 10/02 due to shock. Cont CRRT.  Anemia of ESRD: Hgb 13. ESA would be contraindicated at this time in setting of presumed cancer Dx. Volume: CVP's were up to 15-20 and UF  was ^'d for the last 2-3 days. CVP now down to 5 and weights are sig down, so will change to keep even for now.  Secondary HPTH: CorrCa/Phos ok - not getting binders here - follow for now. Nutrition: Alb low, continue supplements. A-fib T2DM   [name] 10/16/2023, 12:38 PM  Recent Labs  Lab 10/14/23 0320 10/14/23 1640 10/15/23 0407 10/15/23 1557 10/16/23 0507  HGB 10.6*  --  12.1*  --   --   ALBUMIN  2.2*   < > 2.3* 2.2* 2.2*  CALCIUM  7.6*   < > 8.3* 8.3* 8.5*  PHOS 3.2   < > 2.2* 3.2 2.2*  CREATININE 3.13*   < > 2.09* 1.92* 1.66*  K 4.8   < > 4.2 4.6 4.2   < > =  values in this interval not displayed.    Inpatient medications:  arformoterol  15 mcg Nebulization Q12H   atorvastatin   80 mg Per Tube QHS   budesonide (PULMICORT) nebulizer solution  0.5 mg Nebulization BID   Chlorhexidine  Gluconate Cloth  6 each Topical Q0600   clopidogrel   75 mg Per Tube Q breakfast   docusate  100 mg Per Tube BID   famotidine   20 mg Per Tube Daily   feeding supplement (PROSource TF20)  60 mL Per Tube BID   hydrocortisone  sod succinate (SOLU-CORTEF ) inj  100 mg Intravenous Q12H   insulin  aspart  0-15 Units Subcutaneous Q4H   insulin  glargine  15 Units Subcutaneous BID   midodrine   20 mg Per Tube TID WC   multivitamin  1 tablet Per Tube Daily   mouth rinse  15 mL Mouth Rinse Q2H   polyethylene glycol  17 g Per Tube Daily   revefenacin  175 mcg Nebulization Daily   sodium chloride  flush  10 mL Intrapleural Q8H   sodium chloride  flush  3 mL  Intravenous Q12H   thiamine  100 mg Per Tube Daily    dexmedetomidine (PRECEDEX) IV infusion Stopped (10/16/23 1018)   feeding supplement (OSMOLITE 1.5 CAL)     heparin  800 Units/hr (10/16/23 1100)   meropenem (MERREM) IV Stopped (10/16/23 9367)   norepinephrine  (LEVOPHED ) Adult infusion 12 mcg/min (10/16/23 1220)   prismasol BGK 4/2.5 1,400 mL/hr at 10/16/23 0447   prismasol BGK 4/2.5 400 mL/hr at 10/16/23 0558   prismasol BGK 4/2.5 400 mL/hr at 10/15/23 2139   acetaminophen  **OR** acetaminophen , fentaNYL , heparin , heparin , ipratropium-albuterol , ondansetron  (ZOFRAN ) IV, mouth rinse

## 2023-10-16 NOTE — Procedures (Signed)
 Extubation Procedure Note  Patient Details:   Name: Anthony Bullock DOB: 02-04-1960 MRN: [id number]   Airway Documentation:    Vent end date: 10/16/23 Vent end time: 1420   Evaluation  O2 sats: stable throughout Complications: No apparent complications Patient did tolerate procedure well. Bilateral Breath Sounds: Clear, Diminished   Yes  RT extubated pt to 4L [place] with RN at bedside per MD order. Positive cuff leak noted. No signs respiratory distress or stridor at this time. RT will continue to monitor PRN.  Elsie JONELLE Pouch 10/16/2023, 2:26 PM

## 2023-10-16 NOTE — Progress Notes (Addendum)
 Nutrition Follow-up  DOCUMENTATION CODES:   Not applicable  INTERVENTION:  Continue tube feeding via cortrak: recommend beginning at 30 ml/h and increase 10 ml every 12h until goal rate of 55 ml/hr is reached Osmolite 1.5 at 55 ml/h (1320 ml per day) Prosource TF20 60 ml BID Provides 2160 kcal, 122 gm protein, 1005 ml free water daily Pt  at refeeding risk  Monitor electrolyte labs Continue thiamine 100 mg MVI daily   NUTRITION DIAGNOSIS:   Inadequate oral intake related to inability to eat as evidenced by NPO status. - Ongoing   GOAL:   Patient will meet greater than or equal to 90% of their needs - Progressing   MONITOR:   TF tolerance, Vent status, Labs  REASON FOR ASSESSMENT:   Consult Enteral/tube feeding initiation and management  ASSESSMENT:   Pt with hx of  COPD, ESRD on HD, heart failure, PAF, CAD, diabetes, anemia, and PVD s/p bilateral BKA. Admitted with R pleural effusion and SOB, followed by PEA cardiac arrest.   9/26 admitted; thoracentesis -1.85L 9/28 thoracentesis -520 mL  10/1 pt became hypotensive requiring levo and transferred to ICU, intubated 10/3 - Cortrak placed, tip gastric, tube feeds started  10/5 - Self extubated yesterday, needed reintubation 10/6 - Tube feeds at 30 ml/hr titrate up to goal  Patient is currently intubated on ventilator support. Per RN tube feeds never reached goal over the weekend. This morning pt was at 20 ml/hr, RN raised his rate to 30 ml/hr. Continues on CRRT. Levo fluctuating. Recommend continuing to titrate every 12 hours up to goal of 55 ml/hr.   Per RN plan to extubate today. Pt shakes his head yes or no to questions. No family bedside. Ongoing GOC. Last BM 10/4.   MV: 15 L/min Temp (24hrs), Avg:98.1 F (36.7 C), Min:97.2 F (36.2 C), Max:99.1 F (37.3 C) MAP (aline): 60 mmHg  Propofol : Off  Admit weight: 82.1 kg - fluid  Current weight: 71.7 kg    Intake/Output Summary (Last 24 hours) at 10/16/2023  1453 Last data filed at 10/16/2023 1400 Gross per 24 hour  Intake 1565.75 ml  Output 1599 ml  Net -33.25 ml   Net IO Since Admission: -1,101.8 mL [10/16/23 1453]  Drains/Lines: CRRT fluid removed: 1742 ml x 24 hours  Chest tube: 110 ml x 24 hours  0 ml UOP  Nutritionally Relevant Medications: Scheduled Meds:  feeding supplement (PROSource TF20)  60 mL Per Tube BID   insulin  aspart  0-15 Units Subcutaneous Q4H   insulin  glargine  15 Units Subcutaneous BID   midodrine   20 mg Per Tube TID WC   multivitamin  1 tablet Per Tube Daily   thiamine  100 mg Per Tube Daily   Continuous Infusions:  dexmedetomidine (PRECEDEX) IV infusion Stopped (10/16/23 1018)   feeding supplement (OSMOLITE 1.5 CAL)     heparin  650 Units/hr (10/16/23 1400)   meropenem (MERREM) IV 1 g (10/16/23 1427)   norepinephrine  (LEVOPHED ) Adult infusion 11 mcg/min (10/16/23 1400)   prismasol BGK 4/2.5 1,400 mL/hr at 10/16/23 0447   prismasol BGK 4/2.5 400 mL/hr at 10/16/23 0558   prismasol BGK 4/2.5 400 mL/hr at 10/15/23 2139   Labs Reviewed: BUN 27 Creatinine 1.66 Phosphorus 2.2 Magnesium  2.6 GFR 46 CBG ranges from 173-227 mg/dL over the last 24 hours HgbA1c 6.7  Diet Order:   Diet Order             Diet NPO time specified  Diet effective now  EDUCATION NEEDS:   Not appropriate for education at this time  Skin:  Skin Assessment: Skin Integrity Issues: Skin Integrity Issues:: Stage I Stage I: buttocks  Last BM:  10/4  Height:   Ht Readings from Last 1 Encounters:  10/16/23 5' 10 (1.778 m)    Weight:   Wt Readings from Last 1 Encounters:  10/16/23 71.7 kg    Ideal Body Weight:  65.6 kg (Accounts for Bilateral BKA)  BMI:  Body mass index is 22.68 kg/m.  Estimated Nutritional Needs:   Kcal:  2000-2200  Protein:  130-150 gm  Fluid:  UOP + 1L   Olivia Kenning, RD Registered Dietitian  See Amion for more information

## 2023-10-16 NOTE — Progress Notes (Signed)
 PHARMACY - ANTICOAGULATION CONSULT NOTE  Pharmacy Consult for heparin  Indication: atrial fibrillation  Allergies  Allergen Reactions   Epoetin  Alfa Other (See Comments)    Unknown    Ferumoxytol  Other (See Comments)    Unknown    Morphine Sulfate Rash and Other (See Comments)    Itches all over, red spots    Patient Measurements: Height: 5' 10 (177.8 cm) Weight: 71.7 kg (158 lb 1.1 oz) IBW/kg (Calculated) : 73 HEPARIN  DW (KG): 82.1  Vital Signs: Temp: 96.2 F (35.7 C) (10/06 2000) Temp Source: Axillary (10/06 2000) BP: 117/53 (10/06 2100) Pulse Rate: 78 (10/06 2100)  Labs: Recent Labs    10/14/23 0320 10/14/23 1640 10/15/23 0407 10/15/23 1557 10/16/23 0507 10/16/23 1525 10/16/23 1925 10/16/23 2026  HGB 10.6*  --  12.1*  --   --   --  13.9 12.1*  HCT 32.3*  --  36.8*  --   --   --  41.0 38.4*  PLT 231  --  260  --   --   --   --  267  APTT 47*   < > 76*  --  126*  --   --  82*  LABPROT 19.0*  --   --   --   --   --   --   --   INR 1.5*  --   --   --   --   --   --   --   HEPARINUNFRC >1.10*  --  >1.10*  --  >1.10*  --   --   --   CREATININE 3.13*   < > 2.09*   < > 1.66* 1.70*  --  1.67*   < > = values in this interval not displayed.    Estimated Creatinine Clearance: 46.5 mL/min (A) (by C-G formula based on SCr of 1.67 mg/dL (H)).  Assessment: Patient is a 63 year old admitted for septic shock requiring multiple pressors and pleural effusion, complicated by respiratory arrest. He has a history of atrial fibrillation on Eliquis  PTA. Last dose Eliquis  10/09/23 AM.  Pharmacy consulted to dose IV heparin . Heparin  held for biopsy 10/3, ok to restart 10/14 per team.    10/6 PM: aptt therapeutic at 82s with heparin  running at 650 units/hour. No signs of bleeding or issues with the heparin  infusion noted.    Goal of Therapy:  Heparin  level 0.3-0.7 units/ml aPTT 66-102 seconds Monitor platelets by anticoagulation protocol: Yes   Plan:  Cont Heparin  at 650  units/hr Daily CBC, aPTT, and heparin  level Monitor for bleeding  Massie Fila, PharmD Clinical Pharmacist  10/16/2023 9:32 PM

## 2023-10-16 NOTE — Progress Notes (Signed)
 og  NAME:  Anthony Bullock, MRN:  [id number], DOB:  1960/01/31, LOS: 9 ADMISSION DATE:  10/06/2023, CONSULTATION DATE:  10/09/23 REFERRING MD:  Dr. Trixie, CHIEF COMPLAINT:  SOB    History of Present Illness:   64 yoM with PMH significant for COPD, ESRD TTS HD, HTN, HLD, HFrEF EF 40, HFpEF, PAF on Eliquis , CAD on plavix , DMT2, PVD s/p bilateral BKA, anemia, GIB who presented 9/26 with one month hx of SOB and intermittent right sided chest pain radiating into R shoulder.  No cough or fever. CXR performed while at HD 9/25 which showed right pleural effusion, recommended further evaluation and sent to ER.   Former smoker, quit 13 yrs ago.  Has 64 pack yr history.  Pt is not always forth coming with specific information, wife at bedside is helpful.  Pt reports no recent fever or chills, N/V.  Has had sinus congestion with yellow drainage and tint of blood for several days and as of yesterday having intermittent yellowish productive cough.  Does report exertional dyspnea and intermittent pleuritic pain with inspiration and R shoulder pain for at least 4 weeks.  Poor appetite over last week and reports weight loss, ?20lbs but on chart review, is down ~6kg since May.  Also, wife reports pt had fall with resultant right sided chest pain after hospitalization in May for pulmonary edema and April for GIB and s/p PCI to LAD/ RCA 04/12/23; pt did not seek evaluation after fall.  Also had fall last week after getting off lawn mower after prolonged use, denies hitting head. Wife reports it is not uncommon for him to fall due to weakness after HD and ongoing use of BLE prosthetics. On imaging review, pt has had moderate right pleural effusion since CXR in May 2025.  On admit, required 4L [place].  Afebrile and normal WBC/ differential. LFTs normal. Admitted to [facility].  Underwent IR thoracentesis on 9/26 with 1.85L of amber colored pleural fluid however was not sent for studies.  CXR 9/28 showed increasing R pleural effusion, tracking  laterally and underwent IR thoracentesis with of amber fluid which was sent for cell count and cytology.  Pt did report improvement in SOB after thoracentesis's and weaned to room air 9/28.  Chest CT w/ contrast performed after thoracentesis which showed extensive pleural nodularity in right chest, posterior right pleural nodule measures 4x1, with plaque-like nodular pleural thickening inferolaterally extending into right chest wall with pathologic fracture of anterior 5th and destruction of 6/7 ribs suspicious for malignancy, 17mm GGO in LUL, 5mm LLL pulmonary nodule, 13 mm paraesophageal lymph node, and RUL an RML volume loss with collapse/ consolidation in RLL with areas of loculated pleural fluid in right upper hemithorax.  Pleural fluid noted straw colored, LDH 660, neutrophil predominant, glucose 160, protein 5.5.  Pulmonary consulted for further recommendations.   On 10/1, Patient became hypotensive requiring Levophed .  Increasing WBC. Patient without complaints. Patient being followed by PCCM for pulmonary. Labs and scans reviewed-CT chest with contrast 9/28- not a PE protocol study, nodularity suggestive of possible malignancy, loculated large right pleural effusion.  Pertinent  Medical History  Former smoker, COPD, ESRD TTS HD, hypotension on midodrine , HTN, HLD, HFrEF, PAF, CAD, DMT2, peripheral neuropathy, PVD s/p bilateral BKA, anemia, GIB  Significant Hospital Events: Including procedures, antibiotic start and stop dates in addition to other pertinent events   9/26 admit, IR R thora 1.85 ml- no fluid sent 9/28 IR thora 520 ml, pleural studies sent 10/1 transferred to icu  with hypotension started on pressors and respiratory distress; brief cardiac arrest rosc 10 minutes; intubated 10/3: Remains on vaso and levo, on CRRT started pulling 50 cc an hour 10/4 patient remain on vasopressin  and norepinephrine , failed spontaneous breathing trial due to apnea and low minute ventilation,  self extubated later in day and reintubated  Interim History / Subjective:  Self extubated yesterday and required reintubation Currently on Precedex and Fentanyl , comfortable Remains on low dose NE at 6  Objective    Blood pressure (!) 117/42, pulse (!) 57, temperature (!) 97.3 F (36.3 C), temperature source Axillary, resp. rate (!) 24, height 5' 10 (1.778 m), weight 71.7 kg, SpO2 100%. CVP:  [7 mmHg-10 mmHg] 7 mmHg  Vent Mode: PRVC FiO2 (%):  [40 %] 40 % Set Rate:  [24 bmp] 24 bmp Vt Set:  [580 mL] 580 mL PEEP:  [8 cmH20] 8 cmH20 Plateau Pressure:  [23 cmH20-26 cmH20] 23 cmH20   Intake/Output Summary (Last 24 hours) at 10/16/2023 0807 Last data filed at 10/16/2023 0700 Gross per 24 hour  Intake 1794.79 ml  Output 1800.7 ml  Net -5.91 ml   Filed Weights   10/14/23 0700 10/15/23 0500 10/16/23 0500  Weight: 76.7 kg 72.8 kg 71.7 kg   Examination: General: Crtitically ill-appearing male, orally intubated HEENT: [place]/AT, eyes anicteric.  ETT and OGT in place Neuro: Sedated, not following commands but does intermittently open his eyes Chest: Normal effort, CTAB Heart: RRR, no M/R/G Abdomen: Soft, nondistended, bowel sounds present Extremities: Status post bilateral BKA   Assessment and Plan  Status post in hospital VT followed by PEA cardiac arrest in the setting of hypoxia - resolved, cardiology has since signed off, not a candidate for cath. Acute NSTEMI Acute on chronic combined systolic/diastolic heart failure Paroxysmal A-fib Coronary artery disease Shock, mixed cardiogenic and septic with pseudomonas resistant HCAP (trach aspirate from 10/1) Acute septic encephalopathy Acute respiratory failure with hypoxia - failed self extubation 10/5 Recurrent loculated right-sided pleural effusion and status post chest tube placement Bilateral multifocal pneumonia with Pseudomonas Right-sided chest wall mass status post CT-guided biopsy End-stage renal disease on CRRT Diabetes  type 2 with hyperglycemia Protein Calorie Malnutrition  Continue supportive care Continue IV heparin  infusion Per nephrology will keep net even on CRRT Continue Plavix  and statin Shock state is improving, currently on Levophed  at 6 mics and weaning Goal SBP 100-110 Continue midodrine  10 mg 3 times daily per tube Minimize sedation, currently on Precedex + Fentanyl  Continue full vent support Chest tube output was 110 cc in last 24-hour, will remove chest tube once output is less than 100 cc Antibiotics switched to doxycycline  and meropenem in the setting of resistant Pseudomonas Follow-up path report from chest wall mass, still pending Continue CRRT Closely monitor electrolytes Continue sliding scale and Lantus  with CBG goal 140-180, increase lantus  from 10 to 15 BID Continue TF's  CC time: 30 min.   Sammi Gore, PA - [facility] For pager details, please see AMION or use Epic chat  After 1900, please call [facility] for cross coverage needs 10/16/2023, 8:20 AM

## 2023-10-17 DIAGNOSIS — Z992 Dependence on renal dialysis: Secondary | ICD-10-CM | POA: Diagnosis not present

## 2023-10-17 DIAGNOSIS — I469 Cardiac arrest, cause unspecified: Secondary | ICD-10-CM | POA: Diagnosis not present

## 2023-10-17 DIAGNOSIS — J9601 Acute respiratory failure with hypoxia: Secondary | ICD-10-CM | POA: Diagnosis not present

## 2023-10-17 DIAGNOSIS — N186 End stage renal disease: Secondary | ICD-10-CM | POA: Diagnosis not present

## 2023-10-17 LAB — CBC
HCT: 40 % (ref 39.0–52.0)
Hemoglobin: 12.5 g/dL — ABNORMAL LOW (ref 13.0–17.0)
MCH: 27.9 pg (ref 26.0–34.0)
MCHC: 31.3 g/dL (ref 30.0–36.0)
MCV: 89.3 fL (ref 80.0–100.0)
Platelets: 225 K/uL (ref 150–400)
RBC: 4.48 MIL/uL (ref 4.22–5.81)
RDW: 15.6 % — ABNORMAL HIGH (ref 11.5–15.5)
WBC: 33.6 K/uL — ABNORMAL HIGH (ref 4.0–10.5)
nRBC: 0.1 % (ref 0.0–0.2)

## 2023-10-17 LAB — RENAL FUNCTION PANEL
Albumin: 2 g/dL — ABNORMAL LOW (ref 3.5–5.0)
Albumin: 2 g/dL — ABNORMAL LOW (ref 3.5–5.0)
Anion gap: 11 (ref 5–15)
Anion gap: 11 (ref 5–15)
BUN: 26 mg/dL — ABNORMAL HIGH (ref 8–23)
BUN: 22 mg/dL (ref 8–23)
CO2: 23 mmol/L (ref 22–32)
CO2: 23 mmol/L (ref 22–32)
Calcium: 8.4 mg/dL — ABNORMAL LOW (ref 8.9–10.3)
Calcium: 8.5 mg/dL — ABNORMAL LOW (ref 8.9–10.3)
Chloride: 102 mmol/L (ref 98–111)
Chloride: 102 mmol/L (ref 98–111)
Creatinine, Ser: 1.52 mg/dL — ABNORMAL HIGH (ref 0.61–1.24)
Creatinine, Ser: 1.45 mg/dL — ABNORMAL HIGH (ref 0.61–1.24)
GFR, Estimated: 51 mL/min — ABNORMAL LOW (ref >60)
GFR, Estimated: 54 mL/min — ABNORMAL LOW (ref >60)
Glucose, Bld: 132 mg/dL — ABNORMAL HIGH (ref 70–99)
Glucose, Bld: 113 mg/dL — ABNORMAL HIGH (ref 70–99)
Phosphorus: 2.4 mg/dL — ABNORMAL LOW (ref 2.5–4.6)
Phosphorus: 2.6 mg/dL (ref 2.5–4.6)
Potassium: 4.4 mmol/L (ref 3.5–5.1)
Potassium: 4.4 mmol/L (ref 3.5–5.1)
Sodium: 136 mmol/L (ref 135–145)
Sodium: 136 mmol/L (ref 135–145)

## 2023-10-17 LAB — MAGNESIUM: Magnesium: 2.6 mg/dL — ABNORMAL HIGH (ref 1.7–2.4)

## 2023-10-17 LAB — GLUCOSE, CAPILLARY
Glucose-Capillary: 100 mg/dL — ABNORMAL HIGH (ref 70–99)
Glucose-Capillary: 106 mg/dL — ABNORMAL HIGH (ref 70–99)
Glucose-Capillary: 114 mg/dL — ABNORMAL HIGH (ref 70–99)
Glucose-Capillary: 134 mg/dL — ABNORMAL HIGH (ref 70–99)
Glucose-Capillary: 135 mg/dL — ABNORMAL HIGH (ref 70–99)
Glucose-Capillary: 94 mg/dL (ref 70–99)

## 2023-10-17 LAB — APTT: aPTT: 77 s — ABNORMAL HIGH (ref 24–36)

## 2023-10-17 LAB — SURGICAL PATHOLOGY

## 2023-10-17 LAB — LACTIC ACID, PLASMA: Lactic Acid, Venous: 1 mmol/L (ref 0.5–1.9)

## 2023-10-17 LAB — HEPARIN LEVEL (UNFRACTIONATED): Heparin Unfractionated: 0.86 [IU]/mL — ABNORMAL HIGH (ref 0.30–0.70)

## 2023-10-17 MED ORDER — HYDROCORTISONE SOD SUC (PF) 100 MG IJ SOLR
100.0000 mg | Freq: Every day | INTRAMUSCULAR | Status: DC
  Administered 2023-10-18 – 2023-10-19 (×2): 100 mg via INTRAVENOUS
  Filled 2023-10-17 (×2): qty 2

## 2023-10-17 MED ORDER — POLYETHYLENE GLYCOL 3350 17 G PO PACK
17.0000 g | PACK | Freq: Two times a day (BID) | ORAL | Status: DC
  Administered 2023-10-17 – 2023-10-19 (×4): 17 g
  Filled 2023-10-17 (×4): qty 1

## 2023-10-17 MED ORDER — VASOPRESSIN 20 UNITS/100 ML INFUSION FOR SHOCK: 0.0000 [IU]/min | INTRAVENOUS | Status: DC

## 2023-10-17 MED ORDER — SODIUM PHOSPHATES 45 MMOLE/15ML IV SOLN: 30.0000 mmol | Freq: Once | INTRAVENOUS | Status: DC

## 2023-10-17 MED ORDER — VASOPRESSIN 20 UNITS/100 ML INFUSION FOR SHOCK
INTRAVENOUS | Status: AC
  Administered 2023-10-17: 0.03 [IU]/min via INTRAVENOUS
  Filled 2023-10-17: qty 100

## 2023-10-17 MED ORDER — POLYVINYL ALCOHOL 1.4 % OP SOLN: 1.0000 [drp] | OPHTHALMIC | Status: DC | PRN

## 2023-10-17 NOTE — Progress Notes (Signed)
 Anthony Bullock Progress Note   Subjective:  Seen in room.  Made DNR by primary today. Moving towards comfort care.  Objective Vitals:   10/17/23 0845 10/17/23 0942 10/17/23 1200 10/17/23 1249  BP:      Pulse: (!) 57   (!) 55  Resp: 18   18  Temp:   97.6 F (36.4 C)   TempSrc:   Axillary   SpO2: 100%   100%  Weight:      Height:  5' 10 (1.778 m)     Physical Exam Gen on vent, sedated Sclera anicteric, throat w/ ETT No jvd or bruits Chest clear anterior/ lateral RRR no MRG Abd soft ntnd no mass or ascites +bs Ext bilat BKA, no edema Neuro is on vent, sedated TDC in R chest   OP HD: TTS [facility] 4h  B400   82.5kg  1K bath  TDC  Hep none - Calcitriol  0.75mcg PO q HD - Cinacalcet  90mg  PO q HD   Assessment/Plan: R loc pleural effusion/ RLL consolidation vs atx: w/ extensive R pleural nodularity and possibly pathologic rib fx's. Prob PNA and possible malignancy. SP 1.8 L thora on 9/26, then 500 cc thora on 9/28. IR then did CT guided biopsy of R chest wall mass on 10/3.  Shock: continues to require sig pressors Cardiac arrest: on 10/01, ROSC approx 10 min.  ESRD: HD on TTS. Had HD x 1 on 9/30. CRRT started 10/02 due to shock. Cont CRRT,  no restart filter if clots Anemia of ESRD: Hgb 13. ESA would be contraindicated at this time in setting of presumed cancer Dx. Volume: CVP's were up to 15-20 and UF  was ^'d for the last 2-3 days. CVP now down to 5 and weights are sig down, so will change to keep even for now.  Secondary HPTH: CorrCa/Phos ok - not getting binders here - follow for now. Nutrition: Alb low, continue supplements. A-fib T2DM Dispo: likely going to move toward withdrawal of care   Anthony Bonine MD 10/17/2023, 1:42 PM  Recent Labs  Lab 10/16/23 2026 10/16/23 2144 10/17/23 0516  HGB 12.1* 12.2* 12.5*  ALBUMIN  1.9*  --  2.0*  CALCIUM  7.9*  --  8.4*  PHOS 4.0  --  2.4*  CREATININE 1.67*  --  1.52*  K 4.4 4.1 4.4    Inpatient  medications:  arformoterol  15 mcg Nebulization Q12H   atorvastatin   80 mg Per Tube QHS   budesonide (PULMICORT) nebulizer solution  0.5 mg Nebulization BID   Chlorhexidine  Gluconate Cloth  6 each Topical Q0600   clopidogrel   75 mg Per Tube Q breakfast   docusate  100 mg Per Tube BID   famotidine   20 mg Per Tube Daily   feeding supplement (PROSource TF20)  60 mL Per Tube BID   fentaNYL  (SUBLIMAZE ) injection  25-50 mcg Intravenous Once   [START ON 10/18/2023] hydrocortisone  sod succinate (SOLU-CORTEF ) inj  100 mg Intravenous Daily   insulin  aspart  0-15 Units Subcutaneous Q4H   insulin  glargine  15 Units Subcutaneous BID   midodrine   20 mg Per Tube Q8H   multivitamin  1 tablet Per Tube Daily   mouth rinse  15 mL Mouth Rinse Q2H   pantoprazole  (PROTONIX ) IV  40 mg Intravenous Daily   polyethylene glycol  17 g Per Tube BID   revefenacin  175 mcg Nebulization Daily   sodium chloride  flush  10 mL Intrapleural Q8H   sodium chloride  flush  3 mL  Intravenous Q12H    dexmedetomidine (PRECEDEX) IV infusion 0.5 mcg/kg/hr (10/17/23 1300)   feeding supplement (OSMOLITE 1.5 CAL)     fentaNYL  infusion INTRAVENOUS 75 mcg/hr (10/17/23 1300)   heparin  650 Units/hr (10/17/23 1300)   meropenem (MERREM) IV 1 g (10/17/23 1328)   norepinephrine  (LEVOPHED ) Adult infusion 5 mcg/min (10/17/23 1300)   prismasol BGK 4/2.5 1,400 mL/hr at 10/17/23 1032   prismasol BGK 4/2.5 400 mL/hr at 10/17/23 1232   prismasol BGK 4/2.5 400 mL/hr at 10/17/23 0805   vasopressin  Stopped (10/17/23 1101)   acetaminophen  **OR** acetaminophen , artificial tears, fentaNYL , heparin , heparin , ipratropium-albuterol , ondansetron  (ZOFRAN ) IV, mouth rinse

## 2023-10-17 NOTE — Progress Notes (Signed)
 PHARMACY - ANTICOAGULATION CONSULT NOTE  Pharmacy Consult for heparin  Indication: atrial fibrillation  Allergies  Allergen Reactions   Epoetin  Alfa Other (See Comments)    Unknown    Ferumoxytol  Other (See Comments)    Unknown    Morphine Sulfate Rash and Other (See Comments)    Itches all over, red spots    Patient Measurements: Height: 5' 10 (177.8 cm) Weight: 72.3 kg (159 lb 6.3 oz) IBW/kg (Calculated) : 73 HEPARIN  DW (KG): 82.1  Vital Signs: Temp: 97.7 F (36.5 C) (10/07 0800) Temp Source: Axillary (10/07 0800) BP: 103/49 (10/07 0800) Pulse Rate: 57 (10/07 0845)  Labs: Recent Labs    10/15/23 0407 10/15/23 1557 10/16/23 0507 10/16/23 1525 10/16/23 1925 10/16/23 2026 10/16/23 2144 10/17/23 0516  HGB 12.1*  --   --   --    < > 12.1* 12.2* 12.5*  HCT 36.8*  --   --   --    < > 38.4* 36.0* 40.0  PLT 260  --   --   --   --  267  --  225  APTT 76*  --  126*  --   --  82*  --  77*  HEPARINUNFRC >1.10*  --  >1.10*  --   --   --   --  0.86*  CREATININE 2.09*   < > 1.66* 1.70*  --  1.67*  --  1.52*   < > = values in this interval not displayed.    Estimated Creatinine Clearance: 51.5 mL/min (A) (by C-G formula based on SCr of 1.52 mg/dL (H)).  Assessment: Patient is a 63 year old admitted for septic shock requiring multiple pressors and pleural effusion, complicated by respiratory arrest. He has a history of atrial fibrillation on Eliquis  PTA. Last dose Eliquis  10/09/23 AM.  Pharmacy consulted to dose IV heparin . Heparin  held for biopsy 10/3, ok to restart 10/14 per team.    HL not correlating (HL 0.86) despite no apixaban  since 9/29. Will follow aPTT.  Last INR 10/3 decreased to 1.5. Last CBC stable. aPTT therapeutic at 77. No overnight issues or concerns per RN.  Goal of Therapy:  Heparin  level 0.3-0.7 units/ml aPTT 66-102 seconds Monitor platelets by anticoagulation protocol: Yes   Plan:  Continue Heparin  650 units/hr Daily CBC, aPTT, and heparin   level Monitor for bleeding  Vermell Mccallum, PharmD

## 2023-10-17 NOTE — TOC Progression Note (Signed)
 Transition of Care [facility]) - Progression Note    Patient Details  Name: Anthony Bullock MRN: [id number] Date of Birth: 1960/02/09  Transition of Care [facility]) CM/SW Contact  Inocente GORMAN Kindle, LCSW Phone Number: 10/17/2023, 6:00 PM  Clinical Narrative:    CSW continues to be available for needs.    Expected Discharge Plan: Home/Self Care Barriers to Discharge: Continued Medical Work up               Expected Discharge Plan and Services   Discharge Planning Services: CM Consult Post Acute Care Choice: NA Living arrangements for the past 2 months: Single Family Home                 DME Arranged: N/A         HH Arranged: NA HH Agency: NA         Social Drivers of Health (SDOH) Interventions SDOH Screenings   Food Insecurity: No Food Insecurity (10/11/2023)  Housing: Low Risk  (10/11/2023)  Transportation Needs: No Transportation Needs (10/11/2023)  Utilities: Not At Risk (10/11/2023)  Alcohol  Screen: Low Risk  (02/03/2023)  Depression (PHQ2-9): Low Risk  (02/03/2023)  Financial Resource Strain: Low Risk  (02/03/2023)  Physical Activity: Inactive (02/03/2023)  Social Connections: Socially Integrated (10/11/2023)  Stress: No Stress Concern Present (02/03/2023)  Tobacco Use: Medium Risk (10/06/2023)  Health Literacy: Adequate Health Literacy (02/03/2023)    Readmission Risk Interventions    10/09/2023    9:29 AM 05/01/2023    1:22 PM 04/12/2023    3:14 PM  Readmission Risk Prevention Plan  Transportation Screening Complete Complete Complete  Medication Review (RN Care Manager) Referral to Pharmacy Referral to Pharmacy Referral to Pharmacy  PCP or Specialist appointment within 3-5 days of discharge Complete Complete   HRI or Home Care Consult Complete Complete Complete  SW Recovery Care/Counseling Consult Complete Complete Complete  Palliative Care Screening Not Applicable Not Applicable Not Applicable  Skilled Nursing Facility Not Applicable Not Applicable Not Applicable

## 2023-10-17 NOTE — Progress Notes (Signed)
 Nutrition Brief Note Tube feeds stopped yesterday. Pt extubated and pt apparently pulled cortrak out leading to aspiration event, reintubated. Imaging now shows possible ileus. Ongoing GOC, possible comfort today.    No nutrition interventions warranted at this time. RD will follow along pending GOC.   Olivia Kenning, RD Registered Dietitian  See Amion for more information

## 2023-10-17 NOTE — Progress Notes (Signed)
 NAME:  Anthony Bullock, MRN:  [id number], DOB:  Jun 02, 1960, LOS: 10 ADMISSION DATE:  10/06/2023, CONSULTATION DATE:  10/09/23 REFERRING MD:  Dr. Trixie, CHIEF COMPLAINT:  SOB    History of Present Illness:   82 yoM with PMH significant for COPD, ESRD TTS HD, HTN, HLD, HFrEF EF 40, HFpEF, PAF on Eliquis , CAD on plavix , DMT2, PVD s/p bilateral BKA, anemia, GIB who presented 9/26 with one month hx of SOB and intermittent right sided chest pain radiating into R shoulder.  No cough or fever. CXR performed while at HD 9/25 which showed right pleural effusion, recommended further evaluation and sent to ER.   Former smoker, quit 13 yrs ago.  Has 64 pack yr history.  Pt is not always forth coming with specific information, wife at bedside is helpful.  Pt reports no recent fever or chills, N/V.  Has had sinus congestion with yellow drainage and tint of blood for several days and as of yesterday having intermittent yellowish productive cough.  Does report exertional dyspnea and intermittent pleuritic pain with inspiration and R shoulder pain for at least 4 weeks.  Poor appetite over last week and reports weight loss, ?20lbs but on chart review, is down ~6kg since May.  Also, wife reports pt had fall with resultant right sided chest pain after hospitalization in May for pulmonary edema and April for GIB and s/p PCI to LAD/ RCA 04/12/23; pt did not seek evaluation after fall.  Also had fall last week after getting off lawn mower after prolonged use, denies hitting head. Wife reports it is not uncommon for him to fall due to weakness after HD and ongoing use of BLE prosthetics. On imaging review, pt has had moderate right pleural effusion since CXR in May 2025.  On admit, required 4L [place].  Afebrile and normal WBC/ differential. LFTs normal. Admitted to TRH.  Underwent IR thoracentesis on 9/26 with 1.85L of amber colored pleural fluid however was not sent for studies.  CXR 9/28 showed increasing R pleural effusion, tracking  laterally and underwent IR thoracentesis with of amber fluid which was sent for cell count and cytology.  Pt did report improvement in SOB after thoracentesis's and weaned to room air 9/28.  Chest CT w/ contrast performed after thoracentesis which showed extensive pleural nodularity in right chest, posterior right pleural nodule measures 4x1, with plaque-like nodular pleural thickening inferolaterally extending into right chest wall with pathologic fracture of anterior 5th and destruction of 6/7 ribs suspicious for malignancy, 17mm GGO in LUL, 5mm LLL pulmonary nodule, 13 mm paraesophageal lymph node, and RUL an RML volume loss with collapse/ consolidation in RLL with areas of loculated pleural fluid in right upper hemithorax.  Pleural fluid noted straw colored, LDH 660, neutrophil predominant, glucose 160, protein 5.5.  Pulmonary consulted for further recommendations.   On 10/1, Patient became hypotensive requiring Levophed .  Increasing WBC. Patient without complaints. Patient being followed by PCCM for pulmonary. Labs and scans reviewed-CT chest with contrast 9/28- not a PE protocol study, nodularity suggestive of possible malignancy, loculated large right pleural effusion.  Pertinent  Medical History  Former smoker, COPD, ESRD TTS HD, hypotension on midodrine , HTN, HLD, HFrEF, PAF, CAD, DMT2, peripheral neuropathy, PVD s/p bilateral BKA, anemia, GIB  Significant Hospital Events: Including procedures, antibiotic start and stop dates in addition to other pertinent events   9/26 admit, IR R thora 1.85 ml- no fluid sent 9/28 IR thora 520 ml, pleural studies sent 10/1 transferred to icu with  hypotension started on pressors and respiratory distress; brief cardiac arrest rosc 10 minutes; intubated 10/3: Remains on vaso and levo, on CRRT started pulling 50 cc an hour 10/4 patient remain on vasopressin  and norepinephrine , failed spontaneous breathing trial due to apnea and low minute ventilation,  self extubated later in day and reintubated 10/6 extubated. Failed later that night and reintubated. Shock overnight, pressor needs up to 18 with vaso, currently at 4 NE  Interim History / Subjective:  Failed extubation yesterday, reintubated overnight. Had increase in pressors overnight but now back down  Objective    Blood pressure (!) 94/41, pulse (!) 57, temperature 97.8 F (36.6 C), temperature source Axillary, resp. rate 18, height 5' 10 (1.778 m), weight 72.3 kg, SpO2 100%. CVP:  [0 mmHg-10 mmHg] 8 mmHg  Vent Mode: PRVC FiO2 (%):  [40 %-100 %] 40 % Set Rate:  [18 bmp-24 bmp] 18 bmp Vt Set:  [580 mL] 580 mL PEEP:  [5 cmH20-8 cmH20] 5 cmH20 Pressure Support:  [8 cmH20] 8 cmH20 Plateau Pressure:  [21 cmH20-27 cmH20] 21 cmH20   Intake/Output Summary (Last 24 hours) at 10/17/2023 0805 Last data filed at 10/17/2023 0800 Gross per 24 hour  Intake 2313.24 ml  Output 2032.4 ml  Net 280.84 ml   Filed Weights   10/15/23 0500 10/16/23 0500 10/17/23 0500  Weight: 72.8 kg 71.7 kg 72.3 kg   Examination: General: Crtitically ill-appearing male, orally intubated HEENT: [place]/AT, eyes anicteric.  ETT and OGT in place Neuro: Sedated, not following commands but does intermittently open his eyes Chest: Normal effort, CTAB Heart: RRR, no M/R/G Abdomen: Soft, nondistended, bowel sounds present Extremities: Status post bilateral BKA   Assessment and Plan  Status post in hospital VT followed by PEA cardiac arrest in the setting of hypoxia - resolved, cardiology has since signed off, not a candidate for cath. Acute NSTEMI Acute on chronic combined systolic/diastolic heart failure Paroxysmal A-fib Coronary artery disease Shock, mixed cardiogenic and septic with pseudomonas resistant HCAP (trach aspirate from 10/1) Acute septic encephalopathy Acute respiratory failure with hypoxia - failed self extubation 10/5 then failed planned extubation 10/6 and required reintubation overnight Recurrent  loculated right-sided pleural effusion and status post chest tube placement Bilateral multifocal pneumonia with Pseudomonas Right-sided chest wall mass status post CT-guided biopsy End-stage renal disease on CRRT Diabetes type 2 with hyperglycemia Protein Calorie Malnutrition  Continue supportive care Continue IV heparin  infusion Continue Plavix  and statin Shock state is stable, currently on Levophed  at 4 mics and trying to wean Continue midodrine  20 mg 3 times daily per tube (increased from 10 on 10/6) Continue stress dose steroids, start to wean 10/8 if pressors needs continue to improve Minimize sedation, currently on Precedex + Fentanyl  Continue full vent support, failed extubation 10/6 and reintubated Chest tube output was 90 cc in last 24-hour, would leave today and ensure output remains low (<100) and if so, then remove tomorrow 10/8  Continue meropenem in the setting of resistant Pseudomonas Follow-up path report from chest wall mass, still pending Continue CRRT, keep even per nephro Closely monitor electrolytes Continue sliding scale and Lantus  (increased from 10 to 15 BID on 10/6) with CBG goal 140-180 Continue TF's  CC time: 30 min.   Sammi Gore, PA - [facility] [place] [facility] For pager details, please see AMION or use Epic chat  After 1900, please call [facility] for cross coverage needs 10/17/2023, 8:05 AM

## 2023-10-17 NOTE — Progress Notes (Signed)
 PT Cancellation Note  Patient Details Name: NEWEL OIEN MRN: [id number] DOB: [birth year]   Cancelled Treatment:    Reason Eval/Treat Not Completed: Medical issues which prohibited therapy. Pt is intubated, sedated, on CRRT and pressor support. PT will follow up when pt is more medically stable and better able to participate in PT evaluation.   Bernardino JINNY Ruth 10/17/2023, 9:14 AM

## 2023-10-18 DIAGNOSIS — I469 Cardiac arrest, cause unspecified: Secondary | ICD-10-CM | POA: Diagnosis not present

## 2023-10-18 DIAGNOSIS — Z7189 Other specified counseling: Secondary | ICD-10-CM | POA: Diagnosis not present

## 2023-10-18 DIAGNOSIS — Z992 Dependence on renal dialysis: Secondary | ICD-10-CM | POA: Diagnosis not present

## 2023-10-18 DIAGNOSIS — N186 End stage renal disease: Secondary | ICD-10-CM | POA: Diagnosis not present

## 2023-10-18 DIAGNOSIS — Z515 Encounter for palliative care: Secondary | ICD-10-CM | POA: Diagnosis not present

## 2023-10-18 DIAGNOSIS — J9601 Acute respiratory failure with hypoxia: Secondary | ICD-10-CM | POA: Diagnosis not present

## 2023-10-18 LAB — RENAL FUNCTION PANEL
Albumin: 1.8 g/dL — ABNORMAL LOW (ref 3.5–5.0)
Albumin: 1.9 g/dL — ABNORMAL LOW (ref 3.5–5.0)
Anion gap: 9 (ref 5–15)
Anion gap: 10 (ref 5–15)
BUN: 20 mg/dL (ref 8–23)
BUN: 23 mg/dL (ref 8–23)
CO2: 22 mmol/L (ref 22–32)
CO2: 24 mmol/L (ref 22–32)
Calcium: 8.5 mg/dL — ABNORMAL LOW (ref 8.9–10.3)
Calcium: 8.6 mg/dL — ABNORMAL LOW (ref 8.9–10.3)
Chloride: 101 mmol/L (ref 98–111)
Chloride: 100 mmol/L (ref 98–111)
Creatinine, Ser: 1.54 mg/dL — ABNORMAL HIGH (ref 0.61–1.24)
Creatinine, Ser: 1.54 mg/dL — ABNORMAL HIGH (ref 0.61–1.24)
GFR, Estimated: 51 mL/min — ABNORMAL LOW (ref >60)
GFR, Estimated: 51 mL/min — ABNORMAL LOW (ref >60)
Glucose, Bld: 122 mg/dL — ABNORMAL HIGH (ref 70–99)
Glucose, Bld: 147 mg/dL — ABNORMAL HIGH (ref 70–99)
Phosphorus: 2.4 mg/dL — ABNORMAL LOW (ref 2.5–4.6)
Phosphorus: 3.3 mg/dL (ref 2.5–4.6)
Potassium: 4.5 mmol/L (ref 3.5–5.1)
Potassium: 4.9 mmol/L (ref 3.5–5.1)
Sodium: 132 mmol/L — ABNORMAL LOW (ref 135–145)
Sodium: 134 mmol/L — ABNORMAL LOW (ref 135–145)

## 2023-10-18 LAB — CBC
HCT: 40.6 % (ref 39.0–52.0)
Hemoglobin: 12.9 g/dL — ABNORMAL LOW (ref 13.0–17.0)
MCH: 28.3 pg (ref 26.0–34.0)
MCHC: 31.8 g/dL (ref 30.0–36.0)
MCV: 89 fL (ref 80.0–100.0)
Platelets: 258 K/uL (ref 150–400)
RBC: 4.56 MIL/uL (ref 4.22–5.81)
RDW: 15.9 % — ABNORMAL HIGH (ref 11.5–15.5)
WBC: 27.6 K/uL — ABNORMAL HIGH (ref 4.0–10.5)
nRBC: 0.1 % (ref 0.0–0.2)

## 2023-10-18 LAB — GLUCOSE, CAPILLARY
Glucose-Capillary: 107 mg/dL — ABNORMAL HIGH (ref 70–99)
Glucose-Capillary: 108 mg/dL — ABNORMAL HIGH (ref 70–99)
Glucose-Capillary: 115 mg/dL — ABNORMAL HIGH (ref 70–99)
Glucose-Capillary: 129 mg/dL — ABNORMAL HIGH (ref 70–99)
Glucose-Capillary: 135 mg/dL — ABNORMAL HIGH (ref 70–99)
Glucose-Capillary: 140 mg/dL — ABNORMAL HIGH (ref 70–99)

## 2023-10-18 LAB — MAGNESIUM: Magnesium: 2.5 mg/dL — ABNORMAL HIGH (ref 1.7–2.4)

## 2023-10-18 LAB — APTT
aPTT: 58 s — ABNORMAL HIGH (ref 24–36)
aPTT: 77 s — ABNORMAL HIGH (ref 24–36)

## 2023-10-18 LAB — HEPARIN LEVEL (UNFRACTIONATED): Heparin Unfractionated: 0.6 [IU]/mL (ref 0.30–0.70)

## 2023-10-18 MED ORDER — ACETAMINOPHEN 160 MG/5ML PO SOLN: 650.0000 mg | Freq: Four times a day (QID) | ORAL | Status: DC | PRN

## 2023-10-18 NOTE — Progress Notes (Signed)
 [facility] Progress Note   Subjective:  Seen in room.  Pt's wife at bedside.  Likely to move to comfort care soon- palliative care following, appreciate.    Objective Vitals:   10/18/23 1215 10/18/23 1230 10/18/23 1245 10/18/23 1300  BP:    (!) 103/56  Pulse: 70 71 66 65  Resp: (!) 22 (!) 25 19 19   Temp:      TempSrc:      SpO2: 100% 100% 99% 100%  Weight:      Height:       Physical Exam Gen on vent, sedated Sclera anicteric, throat w/ ETT No jvd or bruits Chest clear anterior/ lateral RRR no MRG Abd soft ntnd no mass or ascites +bs Ext bilat BKA, no edema Neuro is on vent, sedated TDC in R chest   OP HD: TTS [facility] 4h  B400   82.5kg  1K bath  TDC  Hep none - Calcitriol  0.75mcg PO q HD - Cinacalcet  90mg  PO q HD   Assessment/Plan: R loc pleural effusion/ RLL consolidation vs atx: w/ extensive R pleural nodularity and possibly pathologic rib fx's. Prob PNA and possible malignancy. SP 1.8 L thora on 9/26, then 500 cc thora on 9/28. IR then did CT guided biopsy of R chest wall mass on 10/3.  Shock: continues to require sig pressors Cardiac arrest: on 10/01, ROSC approx 10 min.  ESRD: HD on TTS. Had HD x 1 on 9/30. CRRT started 10/02 due to shock. Cont CRRT,  no restart filter if clots.  Labile blood pressures with varying tolerance of CRRT.  Comfort measures are appropriate at any time. Anemia of ESRD: Hgb 13. ESA would be contraindicated at this time in setting of presumed cancer Dx. Secondary HPTH: CorrCa/Phos ok - not getting binders here - follow for now. Nutrition: Alb low, continue supplements. A-fib T2DM Dispo: likely going to move toward withdrawal of care   [name] 10/18/2023, 2:21 PM  Recent Labs  Lab 10/17/23 0516 10/17/23 1615 10/18/23 0513  HGB 12.5*  --  12.9*  ALBUMIN  2.0* 2.0* 1.8*  CALCIUM  8.4* 8.5* 8.5*  PHOS 2.4* 2.6 2.4*  CREATININE 1.52* 1.45* 1.54*  K 4.4 4.4 4.5    Inpatient medications:   arformoterol  15 mcg Nebulization Q12H   atorvastatin   80 mg Per Tube QHS   budesonide (PULMICORT) nebulizer solution  0.5 mg Nebulization BID   Chlorhexidine  Gluconate Cloth  6 each Topical Q0600   clopidogrel   75 mg Per Tube Q breakfast   docusate  100 mg Per Tube BID   famotidine   20 mg Per Tube Daily   feeding supplement (PROSource TF20)  60 mL Per Tube BID   fentaNYL  (SUBLIMAZE ) injection  25-50 mcg Intravenous Once   hydrocortisone  sod succinate (SOLU-CORTEF ) inj  100 mg Intravenous Daily   insulin  aspart  0-15 Units Subcutaneous Q4H   midodrine   20 mg Per Tube Q8H   multivitamin  1 tablet Per Tube Daily   mouth rinse  15 mL Mouth Rinse Q2H   pantoprazole  (PROTONIX ) IV  40 mg Intravenous Daily   polyethylene glycol  17 g Per Tube BID   revefenacin  175 mcg Nebulization Daily   sodium chloride  flush  10 mL Intrapleural Q8H   sodium chloride  flush  3 mL Intravenous Q12H    dexmedetomidine (PRECEDEX) IV infusion 0.2 mcg/kg/hr (10/18/23 1300)   feeding supplement (OSMOLITE 1.5 CAL)     fentaNYL  infusion INTRAVENOUS Stopped (10/18/23 1125)  heparin  650 Units/hr (10/18/23 1300)   meropenem (MERREM) IV Stopped (10/18/23 9355)   norepinephrine  (LEVOPHED ) Adult infusion 7 mcg/min (10/18/23 1300)   prismasol BGK 4/2.5 1,400 mL/hr at 10/18/23 0447   prismasol BGK 4/2.5 400 mL/hr at 10/17/23 2046   prismasol BGK 4/2.5 400 mL/hr at 10/18/23 0109   vasopressin  Stopped (10/17/23 1101)   acetaminophen  **OR** acetaminophen , artificial tears, fentaNYL , heparin , heparin , ipratropium-albuterol , ondansetron  (ZOFRAN ) IV, mouth rinse

## 2023-10-18 NOTE — Progress Notes (Signed)
 NAME:  Anthony Bullock, MRN:  [id number], DOB:  15-Aug-1960, LOS: 11 ADMISSION DATE:  10/06/2023, CONSULTATION DATE:  10/09/23 REFERRING MD:  Dr. Trixie, CHIEF COMPLAINT:  SOB    History of Present Illness:   13 yoM with PMH significant for COPD, ESRD TTS HD, HTN, HLD, HFrEF EF 40, HFpEF, PAF on Eliquis , CAD on plavix , DMT2, PVD s/p bilateral BKA, anemia, GIB who presented 9/26 with one month hx of SOB and intermittent right sided chest pain radiating into R shoulder.  No cough or fever. CXR performed while at HD 9/25 which showed right pleural effusion, recommended further evaluation and sent to ER.   Former smoker, quit 13 yrs ago.  Has 64 pack yr history.  Pt is not always forth coming with specific information, wife at bedside is helpful.  Pt reports no recent fever or chills, N/V.  Has had sinus congestion with yellow drainage and tint of blood for several days and as of yesterday having intermittent yellowish productive cough.  Does report exertional dyspnea and intermittent pleuritic pain with inspiration and R shoulder pain for at least 4 weeks.  Poor appetite over last week and reports weight loss, ?20lbs but on chart review, is down ~6kg since May.  Also, wife reports pt had fall with resultant right sided chest pain after hospitalization in May for pulmonary edema and April for GIB and s/p PCI to LAD/ RCA 04/12/23; pt did not seek evaluation after fall.  Also had fall last week after getting off lawn mower after prolonged use, denies hitting head. Wife reports it is not uncommon for him to fall due to weakness after HD and ongoing use of BLE prosthetics. On imaging review, pt has had moderate right pleural effusion since CXR in May 2025.  On admit, required 4L [place].  Afebrile and normal WBC/ differential. LFTs normal. Admitted to TRH.  Underwent IR thoracentesis on 9/26 with 1.85L of amber colored pleural fluid however was not sent for studies.  CXR 9/28 showed increasing R pleural effusion, tracking  laterally and underwent IR thoracentesis with of amber fluid which was sent for cell count and cytology.  Pt did report improvement in SOB after thoracentesis's and weaned to room air 9/28.  Chest CT w/ contrast performed after thoracentesis which showed extensive pleural nodularity in right chest, posterior right pleural nodule measures 4x1, with plaque-like nodular pleural thickening inferolaterally extending into right chest wall with pathologic fracture of anterior 5th and destruction of 6/7 ribs suspicious for malignancy, 17mm GGO in LUL, 5mm LLL pulmonary nodule, 13 mm paraesophageal lymph node, and RUL an RML volume loss with collapse/ consolidation in RLL with areas of loculated pleural fluid in right upper hemithorax.  Pleural fluid noted straw colored, LDH 660, neutrophil predominant, glucose 160, protein 5.5.  Pulmonary consulted for further recommendations.   On 10/1, Patient became hypotensive requiring Levophed .  Increasing WBC. Patient without complaints. Patient being followed by PCCM for pulmonary. Labs and scans reviewed-CT chest with contrast 9/28- not a PE protocol study, nodularity suggestive of possible malignancy, loculated large right pleural effusion.  Pertinent  Medical History  Former smoker, COPD, ESRD TTS HD, hypotension on midodrine , HTN, HLD, HFrEF, PAF, CAD, DMT2, peripheral neuropathy, PVD s/p bilateral BKA, anemia, GIB  Significant Hospital Events: Including procedures, antibiotic start and stop dates in addition to other pertinent events   9/26 admit, IR R thora 1.85 ml- no fluid sent 9/28 IR thora 520 ml, pleural studies sent 10/1 transferred to icu with  hypotension started on pressors and respiratory distress; brief cardiac arrest rosc 10 minutes; intubated 10/3: Remains on vaso and levo, on CRRT started pulling 50 cc an hour 10/4 patient remain on vasopressin  and norepinephrine , failed spontaneous breathing trial due to apnea and low minute ventilation,  self extubated later in day and reintubated 10/6 extubated. Failed later that night and reintubated. Shock overnight, pressor needs up to 18 with vaso, currently at 4 NE 10/8 remains intubated on CRRT  Interim History / Subjective:  No acute events overnight On CRRT RN keeping events Norepi 7 Chest tube out x 24 hours.   Objective    Blood pressure 115/60, pulse 63, temperature 99.1 F (37.3 C), temperature source Axillary, resp. rate 18, height 5' 10 (1.778 m), weight 72.1 kg, SpO2 100%. CVP:  [0 mmHg-9 mmHg] 9 mmHg  Vent Mode: PRVC FiO2 (%):  [40 %] 40 % Set Rate:  [18 bmp] 18 bmp Vt Set:  [580 mL] 580 mL PEEP:  [5 cmH20] 5 cmH20 Pressure Support:  [8 cmH20] 8 cmH20 Plateau Pressure:  [18 cmH20-20 cmH20] 19 cmH20   Intake/Output Summary (Last 24 hours) at 10/18/2023 0747 Last data filed at 10/18/2023 0700 Gross per 24 hour  Intake 1527.68 ml  Output 1609 ml  Net -81.32 ml   Filed Weights   10/16/23 0500 10/17/23 0500 10/18/23 0500  Weight: 71.7 kg 72.3 kg 72.1 kg   Examination:  General: Adult overweight male on vent HEENT: [place]/AT, PERRL,  no JVD Neuro: Somnolent, eyes open spontaneously, follows simple commands.  Chest: Clear Heart: RRR, no MRG Abdomen: Soft, NT, ND Extremities: Bilateral BKA remote. No edema.   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   Cr 1.54, K 4.5, WBC 27.6 (downtrending), Hgb 12.9 net neg  Assessment and Plan   Status post in hospital VT followed by PEA cardiac arrest in the setting of hypoxia  Acute NSTEMI Coronary artery disease - Cardiology consulted, not a candidate for intervention - Continue Plavix  and statin  Acute on chronic combined systolic/diastolic heart failure: LVEF 40% Paroxysmal A-fib - Heparin  per pharmacy   Shock, mixed cardiogenic and septic with pseudomonas resistant HCAP (trach aspirate from 10/1) Acute septic encephalopathy Bilateral  multifocal pneumonia with Pseudomonas - Norepinephrine  for MAP goal 65 mmHg - midodrine  - Continue meropenem for 7 day course. Day 4/7 - Hydrocortisone  weaned to daily 10/8  Acute respiratory failure with hypoxia - failed self extubation 10/5 then failed planned extubation 10/6 and required reintubation overnight - Continue full vent support - Attempt to wean - Will need further GOC prior to extubation. Will be his 3rd attempt. - Precedex and fentanyl  for tube sedation  Recurrent loculated right-sided pleural effusion and status post chest tube placement - chest tube output x 24 hours. Will keep tube in place for now.   Right-sided chest wall mass status post CT-guided biopsy - Surgical pathology showing plasmacytoma concerning for multiple myeloma - Will discuss with family to determine appropriate course of action.   End-stage renal disease on CRRT - continue CRRT keeping  even  Diabetes type 2 with hyperglycemia Protein Calorie Malnutrition - TF - CBG monitoring and SSI - Lantus     CC time: 38 minutes required for respiratory failure, vent, Shock   Deward [place], AGACNP-BC [facility]  See Amion for personal pager PCCM on call pager [phone] until 7pm. Please call Elink 7p-7a. [phone]  10/18/2023 8:01 AM

## 2023-10-18 NOTE — Progress Notes (Signed)
 PHARMACY - ANTICOAGULATION CONSULT NOTE  Pharmacy Consult for heparin  Indication: atrial fibrillation  Allergies  Allergen Reactions   Epoetin  Alfa Other (See Comments)    Unknown    Ferumoxytol  Other (See Comments)    Unknown    Morphine Sulfate Rash and Other (See Comments)    Itches all over, red spots    Patient Measurements: Height: 5' 10 (177.8 cm) Weight: 72.1 kg (158 lb 15.2 oz) IBW/kg (Calculated) : 73 HEPARIN  DW (KG): 82.1  Vital Signs: Temp: 96.7 F (35.9 C) (10/08 2000) Temp Source: Axillary (10/08 2000) BP: 101/56 (10/08 2000) Pulse Rate: 65 (10/08 2000)  Labs: Recent Labs    10/16/23 0507 10/16/23 1525 10/16/23 2026 10/16/23 2144 10/17/23 0516 10/17/23 1615 10/18/23 0513 10/18/23 1612 10/18/23 1948  HGB  --    < > 12.1* 12.2* 12.5*  --  12.9*  --   --   HCT  --    < > 38.4* 36.0* 40.0  --  40.6  --   --   PLT  --   --  267  --  225  --  258  --   --   APTT 126*  --  82*  --  77*  --  58*  --  77*  HEPARINUNFRC >1.10*  --   --   --  0.86*  --  0.60  --   --   CREATININE 1.66*   < > 1.67*  --  1.52* 1.45* 1.54* 1.54*  --    < > = values in this interval not displayed.    Estimated Creatinine Clearance: 50.7 mL/min (A) (by C-G formula based on SCr of 1.54 mg/dL (H)).  Assessment: Patient is a 63 year old admitted for septic shock requiring multiple pressors and pleural effusion, complicated by respiratory arrest. He has a history of atrial fibrillation on Eliquis  PTA. Last dose Eliquis  10/09/23 AM.  Pharmacy consulted to dose IV heparin . Heparin  held for biopsy 10/3, ok to restart 10/14 per team.    HL not correlating (HL 0.60) despite no apixaban  since 9/29. Will follow aPTT.  Last INR 10/3 decreased to 1.5. Last CBC stable. aPTT subtherapeutic at 58. No overnight issues or concerns per RN.  Goal of Therapy:  Heparin  level 0.3-0.7 units/ml aPTT 66-102 seconds Monitor platelets by anticoagulation protocol: Yes   Plan:  Increase Heparin  750  units/hr aPTT in 8 hours  Daily CBC, aPTT, and heparin  level Monitor for bleeding  ____________________________________  10/8 PM update - aPTT 77 sec therapeutic on 750 units/hr.    Maurilio Fila, PharmD Clinical Pharmacist 10/18/2023  8:42 PM

## 2023-10-18 NOTE — Progress Notes (Signed)
 Palliative:  HPI: 63 y.o. male  with past medical history of HTN, HLD, chronic systolic CHF, paroxysmal atrial fibrillation, CAD ESRD on HD (TTS), DM type II, COPD, PVD s/p bilateral BKA, chronic anemia, and GI bleed  admitted on 10/06/2023 with shortness of breath and right-sided chest pain. Chext xray revealed large right-sided pleural effusion. During this hospital stay, he underwent an IR-guided right thoracentesis with 1.85 mL removed; no fluid was sent for analysis. A subsequent thoracentesis on 9/28 yielded 520 mL of fluid, which was sent for pleural studies. On 10/1, the patient was transferred to the ICU due to hypotension and respiratory distress, requiring initiation of vasopressors. The clinical course was complicated by a brief cardiac arrest with ROSC after 10 minutes, and the patient was subsequently intubated. Patient has had 4 hospital admissions over the last 6 months. Pathology has returned with concern for multiple myeloma.   I met today at Melissa's bedside and discussed with RN. No family currently at bedside. Colden is awake and interactive.   I returned to bedside when RN noted that family is at bedside. Wife, Devere, is at bedside and I discussed with her along with Deward Eastern NP PCCM. Devere has overall good understanding of her husband's health obstacles. Reviewed with her course as well as pathology results. Devere shared that Kindred was having more issues prior to admission with decreased appetite as well as hypotension requiring midodrine  limiting tolerance of dialysis. Devere shares that they have discussed end of life matters previously. She knows that he wants a chance but would not desire prolonged life support. We did discuss poor candidate for tracheostomy especially with ongoing dialysis. Unfortunately we have reached a standstill. He is not necessarily declining but also not improving so path forward is difficult. Tylen is present and alert and indicates desire to continue to try and  work towards successful extubation. We discussed tentative plan to optimize for hopes of successful extubation. We did discuss that this should be with a goal to not return back on the ventilator.   I spent more time with Lupita and Devere. Devere shares that they have been married 31 years. He has been on dialysis for 8 years. He has had multiple serious health complications. Devere shares that Ilyaas has remained very strong and motivated and has always been able to motivate and push himself through to have improved quality of life. We discussed hopes of extubation in the coming days. We discussed hopes that this will give them some good time together where Raylon will be able to more easily communicate and interact with her. Devere also shares that she is primary caregiver for her mother who also lives with them. I left Tajah and Devere to have some time together.   All questions/concerns addressed. Emotional support provided.   Exam: Alert and seems mostly oriented. Tolerating vent via ETT. No distress. Breathing regular, unlabored. Abd soft. LIWS NGT.   Plan: - DNR in place (did not discuss AICD yet) - Plans to continue to optimize with hopes of successful extubation - Ongoing support from palliative care  60 min  Bernarda Kitty, NP Palliative Medicine Team Pager [phone] (Please see [email or web address] for schedule) Team Phone [phone]

## 2023-10-18 NOTE — Progress Notes (Signed)
 PT Cancellation Note  Patient Details Name: Anthony Bullock MRN: [id number] DOB: [birth year]   Cancelled Treatment:    Reason Eval/Treat Not Completed: Other (comment). Family to have meeting with palliative team today. There is a high chance pt to transition to comfort care. Acute PT to return as able to verify if patient/family desires acute PT services post palliative meeting.  Brettney Ficken, PT, DPT Acute Rehabilitation Services Secure chat preferred Office #: [phone]    [name] Ames 10/18/2023, 12:06 PM

## 2023-10-18 NOTE — Progress Notes (Signed)
  Interdisciplinary Goals of Care Family Meeting   Date carried out:: 10/18/2023  Location of the meeting: Bedside  Member's involved: Nurse Practitioner, Bedside Registered Nurse, Family Member or next of kin, and Palliative care team member  Durable Power of Attorney or acting medical decision maker: Patient's wife Devere  Discussion: We discussed goals of care for Anthony Bullock describes his health as waning even prior to this hospitalization.  He was struggling to tolerate dialysis due to blood pressure issues.  Now during this admission and subsequent cardiac arrest and multiorgan failure his improvement has essentially plateaued.  He remains on the mechanical ventilator despite 2 failed extubation attempts.  He remains on CRRT and norepinephrine  infusion for mixed cardiogenic and septic shock.  He is been on the ventilator for 8 days.  Chest wall mass biopsy is now showing plasmacytoma concerning for multiple myeloma.  Logyn was able to somewhat participate in the discussion and is indicated that he wants to remain on the ventilator and continue trying to get better.  I cautioned Khristopher and Devere that at this time he is not a candidate for cancer treatment and is a poor candidate for tracheostomy.  Their hope is that we can optimize him over the next couple of days with a goal of a successful extubation.   Code status: Limited Code or DNR with short term  Disposition: Continue current acute care   Time spent for the meeting: 22 minutes  Deward LELON Eastern 10/18/2023, 2:39 PM

## 2023-10-19 ENCOUNTER — Inpatient Hospital Stay (HOSPITAL_COMMUNITY)

## 2023-10-19 DIAGNOSIS — I469 Cardiac arrest, cause unspecified: Secondary | ICD-10-CM | POA: Diagnosis not present

## 2023-10-19 DIAGNOSIS — J9600 Acute respiratory failure, unspecified whether with hypoxia or hypercapnia: Secondary | ICD-10-CM

## 2023-10-19 DIAGNOSIS — N186 End stage renal disease: Secondary | ICD-10-CM | POA: Diagnosis not present

## 2023-10-19 DIAGNOSIS — Z992 Dependence on renal dialysis: Secondary | ICD-10-CM | POA: Diagnosis not present

## 2023-10-19 DIAGNOSIS — J9601 Acute respiratory failure with hypoxia: Secondary | ICD-10-CM | POA: Diagnosis not present

## 2023-10-19 DIAGNOSIS — Z515 Encounter for palliative care: Secondary | ICD-10-CM | POA: Diagnosis not present

## 2023-10-19 DIAGNOSIS — Z7189 Other specified counseling: Secondary | ICD-10-CM | POA: Diagnosis not present

## 2023-10-19 LAB — RENAL FUNCTION PANEL
Albumin: 1.9 g/dL — ABNORMAL LOW (ref 3.5–5.0)
Albumin: 1.9 g/dL — ABNORMAL LOW (ref 3.5–5.0)
Anion gap: 11 (ref 5–15)
Anion gap: 13 (ref 5–15)
BUN: 22 mg/dL (ref 8–23)
BUN: 24 mg/dL — ABNORMAL HIGH (ref 8–23)
CO2: 20 mmol/L — ABNORMAL LOW (ref 22–32)
CO2: 20 mmol/L — ABNORMAL LOW (ref 22–32)
Calcium: 8.7 mg/dL — ABNORMAL LOW (ref 8.9–10.3)
Calcium: 8.9 mg/dL (ref 8.9–10.3)
Chloride: 102 mmol/L (ref 98–111)
Chloride: 102 mmol/L (ref 98–111)
Creatinine, Ser: 1.5 mg/dL — ABNORMAL HIGH (ref 0.61–1.24)
Creatinine, Ser: 1.52 mg/dL — ABNORMAL HIGH (ref 0.61–1.24)
GFR, Estimated: 52 mL/min — ABNORMAL LOW (ref >60)
GFR, Estimated: 51 mL/min — ABNORMAL LOW (ref >60)
Glucose, Bld: 127 mg/dL — ABNORMAL HIGH (ref 70–99)
Glucose, Bld: 163 mg/dL — ABNORMAL HIGH (ref 70–99)
Phosphorus: 3 mg/dL (ref 2.5–4.6)
Phosphorus: 3.8 mg/dL (ref 2.5–4.6)
Potassium: 4.6 mmol/L (ref 3.5–5.1)
Potassium: 4.8 mmol/L (ref 3.5–5.1)
Sodium: 133 mmol/L — ABNORMAL LOW (ref 135–145)
Sodium: 135 mmol/L (ref 135–145)

## 2023-10-19 LAB — CBC
HCT: 41.7 % (ref 39.0–52.0)
Hemoglobin: 13.3 g/dL (ref 13.0–17.0)
MCH: 28.4 pg (ref 26.0–34.0)
MCHC: 31.9 g/dL (ref 30.0–36.0)
MCV: 89.1 fL (ref 80.0–100.0)
Platelets: 278 K/uL (ref 150–400)
RBC: 4.68 MIL/uL (ref 4.22–5.81)
RDW: 16.1 % — ABNORMAL HIGH (ref 11.5–15.5)
WBC: 37.3 K/uL — ABNORMAL HIGH (ref 4.0–10.5)
nRBC: 0.1 % (ref 0.0–0.2)

## 2023-10-19 LAB — APTT: aPTT: 72 s — ABNORMAL HIGH (ref 24–36)

## 2023-10-19 LAB — MAGNESIUM: Magnesium: 2.8 mg/dL — ABNORMAL HIGH (ref 1.7–2.4)

## 2023-10-19 LAB — HEPARIN LEVEL (UNFRACTIONATED): Heparin Unfractionated: 0.42 [IU]/mL (ref 0.30–0.70)

## 2023-10-19 LAB — GLUCOSE, CAPILLARY
Glucose-Capillary: 120 mg/dL — ABNORMAL HIGH (ref 70–99)
Glucose-Capillary: 109 mg/dL — ABNORMAL HIGH (ref 70–99)
Glucose-Capillary: 124 mg/dL — ABNORMAL HIGH (ref 70–99)
Glucose-Capillary: 136 mg/dL — ABNORMAL HIGH (ref 70–99)

## 2023-10-19 LAB — PHOSPHORUS: Phosphorus: 3.1 mg/dL (ref 2.5–4.6)

## 2023-10-19 MED ORDER — SODIUM CHLORIDE 0.9 % IV SOLN: INTRAVENOUS | Status: DC

## 2023-10-19 MED ORDER — ACETAMINOPHEN 325 MG PO TABS: 650.0000 mg | ORAL_TABLET | Freq: Four times a day (QID) | ORAL | Status: DC | PRN

## 2023-10-19 MED ORDER — GLYCOPYRROLATE 1 MG PO TABS: 1.0000 mg | ORAL_TABLET | ORAL | Status: DC | PRN

## 2023-10-19 MED ORDER — BISACODYL 10 MG RE SUPP
10.0000 mg | Freq: Once | RECTAL | Status: DC
Start: 2023-10-19 — End: 2023-10-20
  Filled 2023-10-19: qty 1

## 2023-10-19 MED ORDER — POLYVINYL ALCOHOL 1.4 % OP SOLN: 1.0000 [drp] | Freq: Four times a day (QID) | OPHTHALMIC | Status: DC | PRN

## 2023-10-19 MED ORDER — GLYCOPYRROLATE 0.2 MG/ML IJ SOLN: 0.2000 mg | INTRAMUSCULAR | Status: DC | PRN

## 2023-10-19 MED ORDER — FENTANYL CITRATE (PF) 50 MCG/ML IJ SOSY
25.0000 ug | PREFILLED_SYRINGE | INTRAMUSCULAR | Status: DC | PRN
  Administered 2023-10-19: 50 ug via INTRAVENOUS
  Administered 2023-10-19: 100 ug via INTRAVENOUS
  Administered 2023-10-19 (×2): 50 ug via INTRAVENOUS
  Filled 2023-10-19 (×3): qty 1
  Filled 2023-10-19: qty 2

## 2023-10-19 MED ORDER — ACETAMINOPHEN 650 MG RE SUPP: 650.0000 mg | Freq: Four times a day (QID) | RECTAL | Status: DC | PRN

## 2023-10-19 MED ORDER — METOCLOPRAMIDE HCL 5 MG/ML IJ SOLN
5.0000 mg | Freq: Three times a day (TID) | INTRAMUSCULAR | Status: DC
  Administered 2023-10-19: 5 mg via INTRAVENOUS
  Filled 2023-10-19: qty 2

## 2023-10-19 NOTE — Progress Notes (Signed)
 Palliative:  HPI: 63 y.o. male  with past medical history of HTN, HLD, chronic systolic CHF, paroxysmal atrial fibrillation, CAD ESRD on HD (TTS), DM type II, COPD, PVD s/p bilateral BKA, chronic anemia, and GI bleed  admitted on 10/06/2023 with shortness of breath and right-sided chest pain. Chext xray revealed large right-sided pleural effusion. During this hospital stay, he underwent an IR-guided right thoracentesis with 1.85 mL removed; no fluid was sent for analysis. A subsequent thoracentesis on 9/28 yielded 520 mL of fluid, which was sent for pleural studies. On 10/1, the patient was transferred to the ICU due to hypotension and respiratory distress, requiring initiation of vasopressors. The clinical course was complicated by a brief cardiac arrest with ROSC after 10 minutes, and the patient was subsequently intubated. Patient has had 4 hospital admissions over the last 6 months. Pathology has returned with concern for multiple myeloma.    I met today at Jovoni's bedside along with wife, Devere. Humphrey is awake but appears more tired today. He does nod his head yes/no but not as consistent as yesterday's visit. I discussed further with them that he is likely reaching a point of being optimized for extubation. I explained that it is important to extubate in a good window where we have done all we can to make his chances of success as good as possible. I also explained that it is equally as important to not delay extubation as the longer we wait the weaker he gets and the more reliant the lungs get on the ventilator support. Devere verbalizes understanding but also reiterates that Elisa indicated yesterday that he wishes to keep fighting. I shared that we are trying to support these wishes by continuing CRRT and vasopressors to manage and support his body the best we are able and to give him the best opportunity to be successful off ventilator. Devere mentions tracheostomy but able to verbalize this is not a good  option for him. She shares that there are other family coming in Saturday and maybe Sunday to visit with them. I shared that the critical care team will continue to work with them on the best timing of extubation. I acknowledged all that they have gone through together over the years.   I helped to provide in-line as well as oral suction. More oral secretions to manage. RN to provide Tylenol  as he is complaining of pain.   All questions/concerns addressed. Emotional support provided. Discussed with Deward Eastern, NP.   Exam: Alert and seems mostly oriented although more fatigued today. Tolerating vent via ETT on pressure support. No distress. Breathing regular, unlabored. Abd soft. LIWS NGT.    Plan: - DNR in place (did not discuss AICD yet) - Plans to continue to optimize with hopes of successful extubation - Ongoing support from palliative care  50 min  Bernarda Kitty, NP Palliative Medicine Team Pager [phone] (Please see [email or web address] for schedule) Team Phone [phone]

## 2023-10-19 NOTE — Progress Notes (Signed)
 [facility] Progress Note   Subjective:  Seen in room.  Pt is alone today.  Reviewed IPAL notes.    Objective Vitals:   11/01/2023 0900 10/12/2023 1000 11/07/2023 1100 11/08/2023 1114  BP: (!) 114/48 (!) 105/49    Pulse: 65 68 71   Resp: 18 14 (!) 26   Temp:      TempSrc:      SpO2: 100% 100% 100% 98%  Weight:      Height:       Physical Exam Gen on vent, sedated Sclera anicteric, throat w/ ETT No jvd or bruits Chest clear anterior/ lateral RRR no MRG Abd soft ntnd no mass or ascites +bs Ext bilat BKA, no edema Neuro is on vent, sedated TDC in R chest   OP HD: TTS [facility] 4h  B400   82.5kg  1K bath  TDC  Hep none - Calcitriol  0.3mcg PO q HD - Cinacalcet  90mg  PO q HD   Assessment/Plan: R loc pleural effusion/ RLL consolidation vs atx: w/ extensive R pleural nodularity and possibly pathologic rib fx's. Prob PNA and possible malignancy. SP 1.8 L thora on 9/26, then 500 cc thora on 9/28. IR then did CT guided biopsy of R chest wall mass on 10/3.  Shock: continues to require sig pressors Cardiac arrest: on 10/01, ROSC approx 10 min.  ESRD: HD on TTS. Had HD x 1 on 9/30. CRRT started 10/02 due to shock. Cont CRRT,  no restart filter if clots.  Labile blood pressures with varying tolerance of CRRT.   IPAL notes reviewed.  Plan for possible extubation after respiratory optimization.   Anemia of ESRD: Hgb 13. ESA would be contraindicated at this time in setting of presumed cancer Dx. Secondary HPTH: CorrCa/Phos ok - not getting binders here - follow for now. Nutrition: Alb low, continue supplements. A-fib T2DM Dispo:DNR now, pending   [name] 10/12/2023, 11:27 AM  Recent Labs  Lab 10/18/23 0513 10/18/23 1612 11/04/2023 0514  HGB 12.9*  --  13.3  ALBUMIN  1.8* 1.9* 1.9*  CALCIUM  8.5* 8.6* 8.7*  PHOS 2.4* 3.3 3.1  3.0  CREATININE 1.54* 1.54* 1.50*  K 4.5 4.9 4.6    Inpatient medications:  arformoterol  15 mcg Nebulization Q12H    atorvastatin   80 mg Per Tube QHS   budesonide (PULMICORT) nebulizer solution  0.5 mg Nebulization BID   Chlorhexidine  Gluconate Cloth  6 each Topical Q0600   clopidogrel   75 mg Per Tube Q breakfast   docusate  100 mg Per Tube BID   famotidine   20 mg Per Tube Daily   feeding supplement (PROSource TF20)  60 mL Per Tube BID   hydrocortisone  sod succinate (SOLU-CORTEF ) inj  100 mg Intravenous Daily   insulin  aspart  0-15 Units Subcutaneous Q4H   midodrine   20 mg Per Tube Q8H   multivitamin  1 tablet Per Tube Daily   mouth rinse  15 mL Mouth Rinse Q2H   pantoprazole  (PROTONIX ) IV  40 mg Intravenous Daily   polyethylene glycol  17 g Per Tube BID   revefenacin  175 mcg Nebulization Daily   sodium chloride  flush  10 mL Intrapleural Q8H   sodium chloride  flush  3 mL Intravenous Q12H    dexmedetomidine (PRECEDEX) IV infusion Stopped (11/10/2023 1035)   feeding supplement (OSMOLITE 1.5 CAL)     fentaNYL  infusion INTRAVENOUS Stopped (10/14/2023 1034)   heparin  750 Units/hr (10/28/2023 1100)   meropenem (MERREM) IV Stopped (11/10/2023 9375)   norepinephrine  (  LEVOPHED ) Adult infusion 6 mcg/min (10/30/2023 1100)   prismasol BGK 4/2.5 1,400 mL/hr at 10/30/2023 0937   prismasol BGK 4/2.5 400 mL/hr at 10/17/2023 1102   prismasol BGK 4/2.5 400 mL/hr at 10/18/2023 0252   acetaminophen  (TYLENOL ) oral liquid 160 mg/5 mL, acetaminophen  **OR** acetaminophen , artificial tears, fentaNYL , heparin , heparin , ipratropium-albuterol , ondansetron  (ZOFRAN ) IV, mouth rinse

## 2023-10-19 NOTE — Progress Notes (Addendum)
 PCCM INTERVAL PROGRESS NOTE  Jemario was extubated successfully earlier today. Now that he is able to speak, he is clear about wanting to be kept comfortable. He and Devere are in agreement to stop CRRT and vasopressors and pursue comfort as the only objective.   Plan: Fentanyl  PRN for pain or respiratory distress (morphine allergy listed) PCCM comfort care protocol Loosened visitation restrictions.  Discontinue all lab draws.   Deward Eastern, AGACNP-BC [facility]  See Amion for personal pager PCCM on call pager [phone] until 7pm. Please call Elink 7p-7a. [phone]  11/08/2023 5:49 PM

## 2023-10-19 NOTE — Progress Notes (Addendum)
PHARMACY - ANTICOAGULATION CONSULT NOTE  Pharmacy Consult for heparin  Indication: atrial fibrillation  Allergies  Allergen Reactions   Epoetin  Alfa Other (See Comments)    Unknown    Ferumoxytol  Other (See Comments)    Unknown    Morphine Sulfate Rash and Other (See Comments)    Itches all over, red spots    Patient Measurements: Height: 5' 10 (177.8 cm) Weight: 73.4 kg (161 lb 13.1 oz) IBW/kg (Calculated) : 73 HEPARIN  DW (KG): 82.1  Vital Signs: Temp: 98.6 F (37 C) (10/09 0800) Temp Source: Axillary (10/09 0800) BP: 105/49 (10/09 1000) Pulse Rate: 71 (10/09 1100)  Labs: Recent Labs    10/17/23 0516 10/17/23 1615 10/18/23 0513 10/18/23 1612 10/18/23 1948 11/05/2023 0514  HGB 12.5*  --  12.9*  --   --  13.3  HCT 40.0  --  40.6  --   --  41.7  PLT 225  --  258  --   --  278  APTT 77*  --  58*  --  77* 72*  HEPARINUNFRC 0.86*  --  0.60  --   --  0.42  CREATININE 1.52*   < > 1.54* 1.54*  --  1.50*   < > = values in this interval not displayed.    Estimated Creatinine Clearance: 52.7 mL/min (A) (by C-G formula based on SCr of 1.5 mg/dL (H)).  Assessment: Patient is a 63 year old admitted for septic shock requiring multiple pressors and pleural effusion, complicated by respiratory arrest. He has a history of atrial fibrillation on Eliquis  PTA. Last dose Eliquis  10/09/23 AM.  Pharmacy consulted to dose IV heparin . Heparin  held for biopsy 10/3, ok to restart 10/14 per team.    HL is now correlating with aPTT and therapeutic at 0.42. Will follow HL going forward.  No overnight issues or concerns per RN.  Goal of Therapy:  Heparin  level 0.3-0.7 units/ml aPTT 66-102 seconds Monitor platelets by anticoagulation protocol: Yes   Plan:  Continue Heparin  750 units/hr Daily CBCand heparin  level Monitor for bleeding  Vermell Mccallum, PharmD 10/30/2023  11:12 AM

## 2023-10-19 NOTE — Progress Notes (Signed)
 NAME:  LORENSO QUIRINO, MRN:  [id number], DOB:  1960-07-21, LOS: 12 ADMISSION DATE:  10/06/2023, CONSULTATION DATE:  10/09/23 REFERRING MD:  Dr. Trixie, CHIEF COMPLAINT:  SOB    History of Present Illness:   17 yoM with PMH significant for COPD, ESRD TTS HD, HTN, HLD, HFrEF EF 40, HFpEF, PAF on Eliquis , CAD on plavix , DMT2, PVD s/p bilateral BKA, anemia, GIB who presented 9/26 with one month hx of SOB and intermittent right sided chest pain radiating into R shoulder.  No cough or fever. CXR performed while at HD 9/25 which showed right pleural effusion, recommended further evaluation and sent to ER.   Former smoker, quit 13 yrs ago.  Has 64 pack yr history.  Pt is not always forth coming with specific information, wife at bedside is helpful.  Pt reports no recent fever or chills, N/V.  Has had sinus congestion with yellow drainage and tint of blood for several days and as of yesterday having intermittent yellowish productive cough.  Does report exertional dyspnea and intermittent pleuritic pain with inspiration and R shoulder pain for at least 4 weeks.  Poor appetite over last week and reports weight loss, ?20lbs but on chart review, is down ~6kg since May.  Also, wife reports pt had fall with resultant right sided chest pain after hospitalization in May for pulmonary edema and April for GIB and s/p PCI to LAD/ RCA 04/12/23; pt did not seek evaluation after fall.  Also had fall last week after getting off lawn mower after prolonged use, denies hitting head. Wife reports it is not uncommon for him to fall due to weakness after HD and ongoing use of BLE prosthetics. On imaging review, pt has had moderate right pleural effusion since CXR in May 2025.  On admit, required 4L [place].  Afebrile and normal WBC/ differential. LFTs normal. Admitted to [facility].  Underwent IR thoracentesis on 9/26 with 1.85L of amber colored pleural fluid however was not sent for studies.  CXR 9/28 showed increasing R pleural effusion, tracking  laterally and underwent IR thoracentesis with of amber fluid which was sent for cell count and cytology.  Pt did report improvement in SOB after thoracentesis's and weaned to room air 9/28.  Chest CT w/ contrast performed after thoracentesis which showed extensive pleural nodularity in right chest, posterior right pleural nodule measures 4x1, with plaque-like nodular pleural thickening inferolaterally extending into right chest wall with pathologic fracture of anterior 5th and destruction of 6/7 ribs suspicious for malignancy, 17mm GGO in LUL, 5mm LLL pulmonary nodule, 13 mm paraesophageal lymph node, and RUL an RML volume loss with collapse/ consolidation in RLL with areas of loculated pleural fluid in right upper hemithorax.  Pleural fluid noted straw colored, LDH 660, neutrophil predominant, glucose 160, protein 5.5.  Pulmonary consulted for further recommendations.   On 10/1, Patient became hypotensive requiring Levophed .  Increasing WBC. Patient without complaints. Patient being followed by PCCM for pulmonary. Labs and scans reviewed-CT chest with contrast 9/28- not a PE protocol study, nodularity suggestive of possible malignancy, loculated large right pleural effusion.  Pertinent  Medical History  Former smoker, COPD, ESRD TTS HD, hypotension on midodrine , HTN, HLD, HFrEF, PAF, CAD, DMT2, peripheral neuropathy, PVD s/p bilateral BKA, anemia, GIB  Significant Hospital Events: Including procedures, antibiotic start and stop dates in addition to other pertinent events   9/26 admit, IR R thora 1.85 ml- no fluid sent 9/28 IR thora 520 ml, pleural studies sent 10/1 transferred to icu with  hypotension started on pressors and respiratory distress; brief cardiac arrest rosc 10 minutes; intubated 10/3: Remains on vaso and levo, on CRRT started pulling 50 cc an hour 10/4 patient remain on vasopressin  and norepinephrine , failed spontaneous breathing trial due to apnea and low minute ventilation,  self extubated later in day and reintubated 10/6 extubated. Failed later that night and reintubated. Shock overnight, pressor needs up to 18 with vaso, currently at 4 NE 10/8 remains intubated on CRRT  Interim History / Subjective:  No acute events overnight GOC yesterday with hopes to optimize and liberate from vent.  Remains CRRT keeping even due to hemodynamic instability.  Chest tube out  Objective    Blood pressure (!) 83/55, pulse 68, temperature 98.3 F (36.8 C), temperature source Axillary, resp. rate (!) 7, height 5' 10 (1.778 m), weight 73.4 kg, SpO2 100%. CVP:  [3 mmHg-11 mmHg] 9 mmHg  Vent Mode: PRVC FiO2 (%):  [40 %] 40 % Set Rate:  [18 bmp] 18 bmp Vt Set:  [580 mL] 580 mL PEEP:  [5 cmH20] 5 cmH20 Pressure Support:  [10 cmH20] 10 cmH20 Plateau Pressure:  [20 cmH20] 20 cmH20   Intake/Output Summary (Last 24 hours) at 10/29/2023 0808 Last data filed at 11/10/2023 0800 Gross per 24 hour  Intake 1422.41 ml  Output 1405.4 ml  Net 17.01 ml   Filed Weights   10/17/23 0500 10/18/23 0500 10/12/2023 0500  Weight: 72.3 kg 72.1 kg 73.4 kg   Examination:  General: Adult male on vent HEENT: [place]/AT, PERRL, no JVD Neuro: Sedated. RASS -2.  Chest: Clear bilateral breath sounds Heart: RRR, no MRG Abdomen: Soft, NT, ND. Ongoing high OGT output Extremities: Bilateral BKA remote. No edema.   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   Cr 1.5, K 4.6, WBC 37.3, Hgb 13.3 net neg  Assessment and Plan   Acute respiratory failure with hypoxia - failed self extubation 10/5. Failed planned extubation 10/6 and required reintubation overnight Pseudomonas pneumoina - Continue full vent support - Attempt to wean - Precedex and fentanyl  for tube sedation. RASS goal -1.  - Meropenem - CXR today  Status post in hospital VT followed by PEA cardiac arrest in the setting of hypoxia  Acute NSTEMI Coronary artery  disease - Cardiology consulted, not a candidate for intervention - Continue Plavix  and statin - Tele monitoring  Acute on chronic combined systolic/diastolic heart failure: LVEF 40% Paroxysmal A-fib - Heparin  per pharmacy  - GDMT on hold (hypotension)  Shock, mixed cardiogenic and septic with pseudomonas resistant HCAP (trach aspirate from 10/1) Acute septic encephalopathy Bilateral multifocal pneumonia with Pseudomonas - Norepinephrine  for MAP goal 65 mmHg - Midodrine  - Continue meropenem for 7 day course. Day 5/7 - Hydrocortisone  weaned to daily 10/8, will plan to wean further 10/10  Recurrent loculated right-sided pleural effusion and status post chest tube placement - chest tube output x 24 hours. Will keep tube in place for now.   Right-sided chest wall mass status post CT-guided biopsy - Surgical pathology showing plasmacytoma concerning for multiple myeloma. Patient and family aware.  - Not a candidate for further workup or therapy at this time.   End-stage renal disease on CRRT - continue CRRT keeping even - May need to try to get negative for vent liberation even if we need more pressors.   Diabetes type 2 with hyperglycemia Protein Calorie Malnutrition OGT with high output.  - TF - CBG monitoring and SSI - Lantus  -  Consider reglan     CC time: 42 minutes required for respiratory failure, vent, Shock   Deward Eastern, AGACNP-BC [facility]  See Amion for personal pager PCCM on call pager [phone] until 7pm. Please call Elink 7p-7a. [phone]  11/03/2023 8:08 AM

## 2023-10-19 NOTE — Procedures (Signed)
 Extubation Procedure Note  Patient Details:   Name: Anthony Bullock DOB: May 14, 1960 MRN: [id number]   Airway Documentation:    Vent end date: 10/14/2023 Vent end time: 1458   Evaluation  O2 sats: currently acceptable Complications: No apparent complications Patient did tolerate procedure well. Bilateral Breath Sounds: Clear, Diminished   Patient extubated per MD order & placed on 4L [place]. Patient is able to speak/cough post extubation though the cough is not that strong.  Tish Eva Lenis 11/05/2023, 3:08 PM

## 2023-10-19 NOTE — Progress Notes (Signed)
PT Cancellation Note  Patient Details Name: Anthony Bullock MRN: [id number] DOB: Feb 11, 1960   Cancelled Treatment:    Reason Eval/Treat Not Completed: Patient not medically ready   Pt just extubated within past 15 minutes. Will allow time to see how he tolerates. Will followup 10/10.    [name], PT Acute Rehabilitation Services  Office [phone]   [name] 10/16/2023, 3:14 PM

## 2023-10-20 ENCOUNTER — Telehealth: Payer: Self-pay

## 2023-10-25 MED FILL — Medication: Qty: 1 | Status: AC

## 2023-11-11 NOTE — Progress Notes (Signed)
 Quindell Shere was a comfort care patient with a time of death @ 2232-10-26 pronounced by Medford BROCKS., RN and Lyle RAMAN., RN. Tremane's spouse was with him at the bedside and Vikash's possessions were returned to his wife Devere. MD. [facility] [place], and patient placement notified. Post mortem checklist completed, funeral home listed there.

## 2023-11-11 NOTE — Telephone Encounter (Signed)
 Spoke with patient's wife, Anthony Bullock.  We expressed our condolences and appreciation for being able to see him and our privilege of being able to care for him for the past 35 years.  Wolm LELON Scarlet MD [facility]

## 2023-11-11 NOTE — Telephone Encounter (Signed)
 Copied from CRM [id number]. Topic: General - Other >> Oct 20, 2023  2:56 PM Burnard DEL wrote: Reason for CRM: Patient calls wife called to let Dr Micheal now that her husband passed away last night.She says that she would like to thank Dr Micheal for everything that he has done for her husband.

## 2023-11-11 NOTE — Progress Notes (Addendum)
 Late note entry 10/10 0838am Noted pt dcsd. Contacted out-pt HD to notify of this. Clinic informed, time of death and date provided at this time. no further support needed.   Lavanda Nasra Counce Dialysis Navigator [phone]

## 2023-11-11 NOTE — Death Summary Note (Signed)
 DEATH SUMMARY   Patient Details  Name: Anthony Bullock MRN: [id number] DOB: [birth year]  Admission/Discharge Information   Admit Date:  10/31/2023  Date of Death: Date of Death: 11-13-2023  Time of Death: Time of Death: 11/28/32  Length of Stay: 11/17/23  Referring Physician: Micheal Wolm LELON, MD   Reason(s) for Hospitalization  Status post V. tach followed by PEA cardiac arrest in the setting of hypoxia Severe sepsis septic shock due to recurrent pneumonia, now with Pseudomonas, not POA Recurrent right-sided pleural effusion status post chest tube Right-sided chest valve mass, pathology confirmed plasmacytoma/multiple myeloma Acute NSTEMI Chronic combined systolic/diastolic heart failure Paroxysmal A-fib Coronary artery disease Acute respiratory failure with hypoxia End-stage renal disease on CRRT Hypervolemic hyponatremia Severe protein calorie malnutrition Diabetes type 2 DNR staus  Diagnoses  Preliminary cause of death: Withdrawal of care in the setting of multisystem organ failure Secondary Diagnoses (including complications and co-morbidities):  Principal Problem:   Pleural effusion Active Problems:   Type 2 diabetes mellitus with hyperlipidemia (HCC)   CAD (coronary artery disease)   S/P bilateral below knee amputation (HCC)   Acute on chronic HFrEF (heart failure with reduced ejection fraction) (HCC)   Chest pain   Paroxysmal atrial fibrillation (HCC)   End-stage renal disease on hemodialysis (HCC)   Hypotension   PAD (peripheral artery disease)   Acute respiratory distress   Hypoxemia   Shock (HCC)   Pressure injury of skin   Cardiac arrest [facility])   Brief Hospital Course (including significant findings, care, treatment, and services provided and events leading to death)  Anthony Bullock is a 63 y.o. year old male  with PMH significant for COPD, ESRD TTS HD, HTN, HLD, HFrEF EF 40, HFpEF, PAF on Eliquis , CAD on plavix , DMT2, PVD s/p bilateral BKA, anemia, GIB who presented  2023-10-31 with one month hx of SOB and intermittent right sided chest pain radiating into R shoulder.  No cough or fever. CXR performed while at HD 9/25 which showed right pleural effusion, recommended further evaluation and sent to ER.    Former smoker, quit 13 yrs ago.  Has 64 pack yr history.  Pt is not always forth coming with specific information, wife at bedside is helpful.  Pt reports no recent fever or chills, N/V.  Has had sinus congestion with yellow drainage and tint of blood for several days and as of yesterday having intermittent yellowish productive cough.  Does report exertional dyspnea and intermittent pleuritic pain with inspiration and R shoulder pain for at least 4 weeks.  Poor appetite over last week and reports weight loss, ?20lbs but on chart review, is down ~6kg since May.  Also, wife reports pt had fall with resultant right sided chest pain after hospitalization in May for pulmonary edema and April for GIB and s/p PCI to LAD/ RCA 04/12/23; pt did not seek evaluation after fall.  Also had fall last week after getting off lawn mower after prolonged use, denies hitting head. Wife reports it is not uncommon for him to fall due to weakness after HD and ongoing use of BLE prosthetics. On imaging review, pt has had moderate right pleural effusion since CXR in May 2025.   On admit, required 4L [place].  Afebrile and normal WBC/ differential. LFTs normal. Admitted to [facility].  Underwent IR thoracentesis on 10-31-2023 with 1.85L of amber colored pleural fluid however was not sent for studies.  CXR 9/28 showed increasing R pleural effusion, tracking laterally and underwent IR thoracentesis with of  amber fluid which was sent for cell count and cytology.  Pt did report improvement in SOB after thoracentesis's and weaned to room air 9/28.  Chest CT w/ contrast performed after thoracentesis which showed extensive pleural nodularity in right chest, posterior right pleural nodule measures 4x1, with plaque-like nodular  pleural thickening inferolaterally extending into right chest wall with pathologic fracture of anterior 5th and destruction of 6/7 ribs suspicious for malignancy, 17mm GGO in LUL, 5mm LLL pulmonary nodule, 13 mm paraesophageal lymph node, and RUL an RML volume loss with collapse/ consolidation in RLL with areas of loculated pleural fluid in right upper hemithorax.  Pleural fluid noted straw colored, LDH 660, neutrophil predominant, glucose 160, protein 5.5.  Pulmonary consulted for further recommendations.    On 10/1, Patient became hypotensive requiring Levophed .  Increasing WBC. Patient without complaints. Patient being followed by PCCM for pulmonary. Labs and scans reviewed-CT chest with contrast 9/28- not a PE protocol study, nodularity suggestive of possible malignancy, loculated large right pleural effusion, cytology was negative for malignancy  10/1 transferred to icu with hypotension started on pressors and respiratory distress; brief cardiac arrest rosc 10 minutes; intubated 10/3: Remains on vaso and levo, on CRRT started pulling 50 cc an hour 10/4 patient remain on vasopressin  and norepinephrine , failed spontaneous breathing trial due to apnea and low minute ventilation, self extubated later in day and reintubated 10/6 extubated. Failed later that night and reintubated. Shock overnight, pressor needs up to 18 with vaso, currently at 4 NE 10/8 remains intubated on CRRT  On 11-14-2023 goals of care discussions carried with family, considering patient failed extubation twice, patient's family decided to proceed with one-way extubation, if patient fails then proceed with comfort care.  Patient cleared all parameters on SBT, he was extubated but within an hour he started struggling, he was started on comfort measures and he passed on 11/14/23 at 10:34 PM.  Patient's family was at bedside   Pertinent Labs and Studies  Significant Diagnostic Studies DG Abd Portable 1V Result Date:  2023-11-14 CLINICAL DATA:  Vomiting. EXAM: PORTABLE ABDOMEN - 1 VIEW COMPARISON:  Radiograph 10/16/2023 FINDINGS: Tip and side port of the enteric tube below the diaphragm in the stomach. There is gaseous distension of transverse colon as well as mild sigmoid colonic distension. No definite small bowel dilatation. Prominent vascular calcifications. IMPRESSION: 1. Tip and side port of the enteric tube below the diaphragm in the stomach. 2. Gaseous distension of transverse colon as well as mild sigmoid colonic distension. This may represent colonic ileus. Electronically Signed   By: Andrea Gasman M.D.   On: November 14, 2023 11:51   DG CHEST PORT 1 VIEW Result Date: 11/14/23 CLINICAL DATA:  History of endotracheal tube. EXAM: PORTABLE CHEST 1 VIEW COMPARISON:  Radiograph 10/16/2023 FINDINGS: The endotracheal tube tip is at the level of the clavicular heads approximately 6.9 cm from the carina. Enteric tube is faintly visualized with tip below the diaphragm. Right-sided dialysis catheter unchanged in position. Left internal jugular central line with tip over the SVC. Left-sided pacemaker. Pigtail catheter coiled in the lateral right hemithorax. Diminished right pleural effusion with residual blunting of the costophrenic angle and ill-defined opacity at the right lung base. No visible pneumothorax. Mild atelectasis at the left lung base. Stable heart size and mediastinal contours. No pulmonary edema. IMPRESSION: 1. Stable support apparatus. 2. Right pigtail catheter remains in place. Diminished right pleural effusion with residual blunting of the costophrenic angle and ill-defined opacity at the right lung base. No visible  pneumothorax. Electronically Signed   By: Andrea Gasman M.D.   On: 2023-10-30 11:50   Portable Chest x-ray Result Date: 10/16/2023 CLINICAL DATA:  Endotracheal tube and feeding tube evaluations EXAM: PORTABLE CHEST 1 VIEW COMPARISON:  10/16/2023 FINDINGS: Endotracheal tube present with tip  measuring 6.6 cm above the carina. Enteric tube is present with tip projecting over the left upper quadrant consistent with location in the upper stomach. Right central venous catheter with tip over the cavoatrial junction region. Left central venous catheter with tip over the low SVC region. Right-sided pigtail drainage catheter, likely pleural. Small right pleural effusion with basilar atelectasis or consolidation. Left lung is clear. No pneumothorax. Calcification of the aorta. Upper abdominal gas-filled mildly dilated colon, likely ileus. IMPRESSION: 1. Appliances appear in satisfactory position. 2. Right pleural effusion with basilar atelectasis or consolidation similar to prior study. Pleural drainage catheter in place. Electronically Signed   By: Elsie Gravely M.D.   On: 10/16/2023 20:42   DG Abd Portable 1V Result Date: 10/16/2023 CLINICAL DATA:  Endotracheal tube and feeding tube evaluations EXAM: PORTABLE CHEST 1 VIEW COMPARISON:  10/16/2023 FINDINGS: Endotracheal tube present with tip measuring 6.6 cm above the carina. Enteric tube is present with tip projecting over the left upper quadrant consistent with location in the upper stomach. Right central venous catheter with tip over the cavoatrial junction region. Left central venous catheter with tip over the low SVC region. Right-sided pigtail drainage catheter, likely pleural. Small right pleural effusion with basilar atelectasis or consolidation. Left lung is clear. No pneumothorax. Calcification of the aorta. Upper abdominal gas-filled mildly dilated colon, likely ileus. IMPRESSION: 1. Appliances appear in satisfactory position. 2. Right pleural effusion with basilar atelectasis or consolidation similar to prior study. Pleural drainage catheter in place. Electronically Signed   By: Elsie Gravely M.D.   On: 10/16/2023 20:42   DG CHEST PORT 1 VIEW Result Date: 10/16/2023 CLINICAL DATA:  [id number] Aspiration into airway [id number] EXAM: PORTABLE  CHEST 1 VIEW COMPARISON:  Chest x-ray 10/16/2023 FINDINGS: Right chest wall dialysis catheter with tip overlying the right atrium. Left chest wall cardiac defibrillator. Left internal jugular central venous catheter with tip overlying the expected region of distal superior vena cava. The heart and mediastinal contours are unchanged. Prominent hilar vasculature. No sclerotic plaque. No focal consolidation. No pulmonary edema. Grossly stable small to moderate right pleural effusion. Interval development of trace left pleural effusion. No pneumothorax. No acute osseous abnormality. IMPRESSION: 1. Grossly stable small to moderate right pleural effusion. 2. Interval development of trace left pleural effusion. 3. Pulmonary venous congestion. 4. Lines and tubes as above. 5.  Aortic Atherosclerosis (ICD10-I70.0). Electronically Signed   By: Morgane  Naveau M.D.   On: 10/16/2023 19:49   DG CHEST PORT 1 VIEW Result Date: 10/16/2023 CLINICAL DATA:  [id number]. History of chest tube placement. Evaluate right chest tube right pleural effusion. EXAM: PORTABLE CHEST 1 VIEW COMPARISON:  Portable chest 10/14/2023. FINDINGS: 5:03 a.m. ETT tip 3.5 cm from the carina. Feeding tube passes well into the stomach but the radiopaque tip is out of view. Left chest defibrillator and presternal wire insertion are stable as well as a right IJ dialysis catheter with the tip in the upper right atrium. Small caliber left IJ infusion catheter terminates in the distal SVC with stable positioning of right chest base pigtail chest tube. Small to moderate right pleural effusion continues to be seen with overlying atelectasis or consolidation Rest of the lungs remain clear. The cardiomediastinal silhouette, vessel markings  are normal. Patchy aortic calcific plaque. Overall aeration is unchanged. IMPRESSION: 1. No significant change in the appearance of the chest. 2. Stable right pleural effusion with overlying atelectasis or consolidation. 3. Stable  support apparatus. 4. Aortic atherosclerosis. Electronically Signed   By: Francis Quam M.D.   On: 10/16/2023 06:44   DG CHEST PORT 1 VIEW Result Date: 10/14/2023 CLINICAL DATA:  Status post intubation. EXAM: PORTABLE CHEST 1 VIEW COMPARISON:  October 14, 2023 (6:32 a.m.) FINDINGS: An endotracheal tube is seen with its distal tip approximately 3.1 cm from the carina. There is stable enteric tube, bilateral venous catheter and left-sided defibrillator lead positioning. The heart size and mediastinal contours are within normal limits. Stable areas of atelectasis and/or infiltrate are seen within the bilateral lung bases, right greater than left. A stable small to moderate sized right-sided pleural effusion is present. No pneumothorax is identified. Multilevel degenerative changes are seen throughout the thoracic spine. IMPRESSION: 1. Endotracheal tube positioning, as described above. 2. Stable bibasilar atelectasis and/or infiltrate, right greater than left. 3. Stable small to moderate sized right-sided pleural effusion. Electronically Signed   By: Suzen Dials M.D.   On: 10/14/2023 14:11   DG CHEST PORT 1 VIEW Result Date: 10/14/2023 CLINICAL DATA:  Hypoxia.  Pleural effusion. EXAM: PORTABLE CHEST 1 VIEW COMPARISON:  10/12/2023 FINDINGS: 0632 hours. The cardio pericardial silhouette is enlarged. Left-sided defibrillator evident. Endotracheal tube tip is 7.5 cm above the base of the carina. A feeding tube passes into the stomach although the distal tip position is not included on the film. Left IJ central line tip overlies the proximal SVC level. Right IJ central line tip overlies the low SVC level near the junction with the RA. Right base atelectasis/scarring with small right pleural effusion similar to prior. Right pigtail drainage catheter remains in place IMPRESSION: 1. No substantial interval change. 2. Persistent right base atelectasis/scarring with small right pleural effusion. Electronically Signed    By: Camellia Candle M.D.   On: 10/14/2023 07:47   CT GUIDED NEEDLE PLACEMENT Result Date: 10/13/2023 INDICATION: 63 year old with extensive pleural disease in the right chest with involvement of the right chest wall. Tissue diagnosis is needed. EXAM: CT-GUIDED BIOPSY OF RIGHT CHEST WALL LESION MEDICATIONS: 1% lidocaine  for local anesthetic ANESTHESIA/SEDATION: No sedation FLUOROSCOPY TIME:  None COMPLICATIONS: None immediate. PROCEDURE: Informed consent was obtained for image guided chest wall biopsy. A timeout was performed prior to the initiation of the procedure. Patient was placed supine on the CT scanner. Images through the chest were obtained. The chest wall lesion involving the anterior lower right chest was targeted. The anterior right chest was prepped with chlorhexidine  and sterile field was created. Skin was anesthetized using 1% lidocaine . Using CT guidance, 17 gauge coaxial needle was directed into the chest wall lesion. Core biopsies obtained with an 18 gauge device. Specimens placed in formalin. 17 gauge coaxial needle was removed. Bandage placed over the puncture site. RADIATION DOSE REDUCTION: This exam was performed according to the departmental dose-optimization program which includes automated exposure control, adjustment of the mA and/or kV according to patient size and/or use of iterative reconstruction technique. FINDINGS: Abnormal soft tissue involving the anterior lower right chest with destruction of the anterior right sixth rib. Pigtail right chest tube is present. Complex right pleural fluid and right pleural disease. Volume loss in the right lung. Aeration in the right lower lobe has improved since 10/08/2023. IMPRESSION: CT-guided core biopsies of the right chest wall lesion. Electronically Signed   By:  Juliene Balder M.D.   On: 10/13/2023 17:17   DG Abd 1 View Result Date: 10/13/2023 CLINICAL DATA:  Abdominal distension. EXAM: ABDOMEN - 1 VIEW COMPARISON:  10/11/2023 FINDINGS:  Nasogastric tube continues to extend into the stomach. Pattern colonic ileus appears stable to mildly improved. No small bowel dilatation. No abnormal calcifications. IMPRESSION: Stable to mildly improved colonic ileus. Electronically Signed   By: Marcey Moan M.D.   On: 10/13/2023 10:14   DG CHEST PORT 1 VIEW Result Date: 10/12/2023 CLINICAL DATA:  Pleural effusion. EXAM: PORTABLE CHEST 1 VIEW COMPARISON:  October 11, 2023. FINDINGS: Stable cardiomediastinal silhouette. Endotracheal and nasogastric tubes are in grossly good position. Bilateral internal jugular catheters are unchanged. Defibrillator is unchanged. Stable right pleural effusion is noted with associated right basilar atelectasis or infiltrate. Pigtail catheter in right lung base is unchanged. IMPRESSION: Stable support apparatus. Stable right pleural effusion with associated right basilar atelectasis or infiltrate. Electronically Signed   By: Lynwood Landy Raddle M.D.   On: 10/12/2023 08:25   CT HEAD WO CONTRAST ( ) Result Date: 10/11/2023 CLINICAL DATA:  08/05/2022 EXAM: CT HEAD WITHOUT CONTRAST TECHNIQUE: Contiguous axial images were obtained from the base of the skull through the vertex without intravenous contrast. RADIATION DOSE REDUCTION: This exam was performed according to the departmental dose-optimization program which includes automated exposure control, adjustment of the mA and/or kV according to patient size and/or use of iterative reconstruction technique. COMPARISON:  08/05/2022 FINDINGS: Brain: No evidence of acute infarct or hemorrhage. The lateral ventricles and midline structures are unremarkable. No acute extra-axial fluid collections. No mass effect. Vascular: There is residual intravascular contrast from preceding CT chest. Given time since prior chest CT this may reflect an element of underlying renal insufficiency. Diffuse atherosclerosis of the internal carotid arteries. Skull: Normal. Negative for fracture or focal  lesion. Sinuses/Orbits: Endotracheal and enteric catheter are identified within the oropharynx. Mucosal thickening within the bilateral maxillary sinuses, with evidence of prior medial wall antrectomies. Other: None. IMPRESSION: 1. No acute intracranial process. Electronically Signed   By: Ozell Daring M.D.   On: 10/11/2023 15:21   DG Abd Portable 1V Result Date: 10/11/2023 CLINICAL DATA:  Orogastric tube placement. EXAM: PORTABLE ABDOMEN - 1 VIEW COMPARISON:  None Available. FINDINGS: Orogastric tube extends into the stomach. Visualized bowel gas demonstrates mild gaseous distension of the colon likely reflective of ileus. Right lateral basilar pleural thoracostomy tube present. IMPRESSION: Orogastric tube extends into the stomach.  Probable colonic ileus. Electronically Signed   By: Marcey Moan M.D.   On: 10/11/2023 12:38   ECHOCARDIOGRAM COMPLETE Result Date: 10/11/2023    ECHOCARDIOGRAM REPORT   Patient Name:   JUNO BOZARD [facility] Date of Exam: 10/11/2023 Medical Rec #:  [id number]     Height:       70.0 in Accession #:    [id number]    Weight:       180.3 lb Date of Birth:  1960-01-29     BSA:          1.998 m Patient Age:    63 years      BP:           91/36 mmHg Patient Gender: M             HR:           90 bpm. Exam Location:  Inpatient Procedure: 2D Echo and Intracardiac Opacification Agent (Both Spectral and Color  Flow Doppler were utilized during procedure). Indications:    Cardiac arrest  History:        Patient has prior history of Echocardiogram examinations. CAD.  Sonographer:    Charmaine Gaskins Referring Phys: [id number] NORLEEN BIRCH PAYNE IMPRESSIONS  1. No left ventricular thrombus is seen (Definity  contrast was used). Left ventricular ejection fraction, by estimation, is 35 to 40%. The left ventricle has moderately decreased function. The left ventricle demonstrates global hypokinesis. Left ventricular diastolic parameters are consistent with Grade I diastolic dysfunction (impaired  relaxation).  2. Right ventricular systolic function is normal. The right ventricular size is normal. Tricuspid regurgitation signal is inadequate for assessing PA pressure.  3. The mitral valve is normal in structure. No evidence of mitral valve regurgitation. No evidence of mitral stenosis.  4. The aortic valve is tricuspid. Aortic valve regurgitation is not visualized. Aortic valve sclerosis is present, with no evidence of aortic valve stenosis.  5. The inferior vena cava is normal in size with greater than 50% respiratory variability, suggesting right atrial pressure of 3 mmHg. Comparison(s): Prior images reviewed side by side. The left ventricular function is unchanged. Even with Definity  contrast, regional wall motion is difficult to analyze. The reduction in LV function seems to be primarily due to global hypokinesis and profound systolic dyssynchrony due to abnormal electrical activation (a very broad QRS complex is present, appears longer duration compared to previous studies). FINDINGS  Left Ventricle: No left ventricular thrombus is seen (Definity  contrast was used). Left ventricular ejection fraction, by estimation, is 35 to 40%. The left ventricle has moderately decreased function. The left ventricle demonstrates global hypokinesis.  Definity  contrast agent was given IV to delineate the left ventricular endocardial borders. The left ventricular internal cavity size was normal in size. There is no left ventricular hypertrophy. Abnormal (paradoxical) septal motion, consistent with left bundle branch block. Left ventricular diastolic parameters are consistent with Grade I diastolic dysfunction (impaired relaxation). Normal left ventricular filling pressure. Right Ventricle: The right ventricular size is normal. No increase in right ventricular wall thickness. Right ventricular systolic function is normal. Tricuspid regurgitation signal is inadequate for assessing PA pressure. Left Atrium: Left atrial size  was normal in size. Right Atrium: Right atrial size was normal in size. Pericardium: There is no evidence of pericardial effusion. Mitral Valve: The mitral valve is normal in structure. No evidence of mitral valve regurgitation. No evidence of mitral valve stenosis. Tricuspid Valve: The tricuspid valve is grossly normal. Tricuspid valve regurgitation is not demonstrated. Aortic Valve: The aortic valve is tricuspid. Aortic valve regurgitation is not visualized. Aortic valve sclerosis is present, with no evidence of aortic valve stenosis. Pulmonic Valve: The pulmonic valve was not well visualized. Pulmonic valve regurgitation is not visualized. No evidence of pulmonic stenosis. Aorta: The aortic root and ascending aorta are structurally normal, with no evidence of dilitation. Venous: The inferior vena cava is normal in size with greater than 50% respiratory variability, suggesting right atrial pressure of 3 mmHg. IAS/Shunts: The interatrial septum was not well visualized.  LEFT VENTRICLE PLAX 2D LVIDd:         5.28 cm LVIDs:         3.77 cm LV PW:         1.10 cm LV IVS:        1.10 cm LVOT diam:     2.20 cm LVOT Area:     3.80 cm  RIGHT VENTRICLE RV S prime:     11.10 cm/s LEFT ATRIUM  Index LA diam:    2.90 cm 1.45 cm/m   AORTA Ao Root diam: 2.80 cm Ao Asc diam:  2.60 cm MITRAL VALVE MV Area (PHT): 4.60 cm    SHUNTS MV Decel Time: 165 msec    Systemic Diam: 2.20 cm MV E velocity: 47.80 cm/s MV A velocity: 60.10 cm/s MV E/A ratio:  0.80 Mihai Croitoru MD Electronically signed by Jerel Balding MD Signature Date/Time: 10/11/2023/10:35:19 AM    Final    DG Chest Port 1 View Result Date: 10/11/2023 CLINICAL DATA:  Chest tube. EXAM: PORTABLE CHEST 1 VIEW COMPARISON:  October 10, 2023. FINDINGS: Interval placement of pigtail chest tube into right pleural effusion, which is decreased compared to prior exam. Associated right basilar atelectasis or infiltrate is noted. IMPRESSION: Interval placement of pigtail  chest tube into right pleural effusion. Electronically Signed   By: Lynwood Landy Raddle M.D.   On: 10/11/2023 09:37   CT Angio Chest Pulmonary Embolism (PE) W or WO Contrast Result Date: 10/11/2023 CLINICAL DATA:  Shortness of breath. EXAM: CT ANGIOGRAPHY CHEST WITH CONTRAST TECHNIQUE: Multidetector CT imaging of the chest was performed using the standard protocol during bolus administration of intravenous contrast. Multiplanar CT image reconstructions and MIPs were obtained to evaluate the vascular anatomy. RADIATION DOSE REDUCTION: This exam was performed according to the departmental dose-optimization program which includes automated exposure control, adjustment of the mA and/or kV according to patient size and/or use of iterative reconstruction technique. CONTRAST:  75mL OMNIPAQUE  IOHEXOL  350 MG/ML SOLN COMPARISON:  Chest CT 10/08/2023 FINDINGS: Cardiovascular: The heart size is upper normal to borderline enlarged. Small pericardial effusion. Coronary artery calcification is evident. Moderate atherosclerotic calcification is noted in the wall of the thoracic aorta. Enlargement of the pulmonary outflow tract/main pulmonary arteries suggests pulmonary arterial hypertension. There is no filling defect within the opacified pulmonary arteries to suggest the presence of an acute pulmonary embolus. The tip of the right IJ central line is positioned at the SVC/RA junction. Mediastinum/Nodes: No mediastinal lymphadenopathy. There is no hilar lymphadenopathy. The esophagus has normal imaging features. There is no axillary lymphadenopathy. Lungs/Pleura: 1.9 cm ground-glass nodule in the anterior left upper lobe (37/6) is similar to prior. 17 mm ground-glass opacity anterior left lung on 59/6 is new in the interval. Atelectasis noted dependent left lung base. There is a large right pleural effusion with loculated components superiorly in this is progressive in the interval (compare axial 40/6 today to axial 39/4 previously).  Collapse/consolidative opacity is seen in all 3 lobes of the right lung. As on the prior study, there is extensive pleural soft tissue nodularity in the right hemithorax. A tiny gas bubble along the posterolateral pleura of the right hemithorax (267/7) is compatible with recent thoracentesis. Upper Abdomen: High density in the gallbladder lumen likely vicarious excretion of contrast material from previous chest CT. Musculoskeletal: Bony changes in the right fifth through seventh ribs again noted secondary to the presence of a chest wall mass. Review of the MIP images confirms the above findings. IMPRESSION: 1. No CT evidence for acute pulmonary embolus. 2. Large right pleural effusion with loculated components superiorly. This is progressive in the interval. 3. Collapse/consolidative opacity in all 3 lobes of the right lung. 4. Extensive pleural soft tissue nodularity in the right hemithorax with bony changes in the right fifth through seventh ribs secondary to the presence of a chest wall mass. 5. 1.9 cm ground-glass nodule in the anterior left upper lobe is similar to prior. 17 mm ground-glass opacity  anterior left lung is new in the interval. These may be infectious/inflammatory but warrant follow-up to ensure stability. 6. Enlargement of the pulmonary outflow tract/main pulmonary arteries suggests pulmonary arterial hypertension. 7.  Aortic Atherosclerosis (ICD10-I70.0). Electronically Signed   By: Camellia Candle M.D.   On: 10/11/2023 06:14   DG CHEST PORT 1 VIEW Result Date: 10/10/2023 CLINICAL DATA:  100030. Leukocytosis and pleural effusion. End-stage renal disease. Hypotension. EXAM: PORTABLE CHEST 1 VIEW COMPARISON:  Chest CT 10/08/2023. FINDINGS: 6:40 a.m. External defibrillator with left chest power source and presternal wire insertion are unchanged as well as a right IJ dialysis catheter with the tip in the right atrium. Moderate-to-large loculated right pleural effusion with overlying lung opacities,  appears increased from 2 days ago. Extensive nodular pleural metastatic disease on the right is better seen with CT, not well demonstrated radiographically. Left lung remains clear. Stable mediastinum. There is aortic atherosclerosis. The cardiac size is normal. No new osseous finding. IMPRESSION: 1. Moderate-to-large loculated right pleural effusion with overlying lung opacities, appears increased from 2 days ago. 2. Extensive nodular pleural metastatic disease on the right is better seen with CT, not well demonstrated radiographically. 3. Aortic atherosclerosis. Electronically Signed   By: Francis Quam M.D.   On: 10/10/2023 06:54   CT CHEST W CONTRAST Result Date: 10/09/2023 CLINICAL DATA:  Possible pulmonary mass on recent chest x-ray EXAM: CT CHEST WITH CONTRAST TECHNIQUE: Multidetector CT imaging of the chest was performed during intravenous contrast administration. RADIATION DOSE REDUCTION: This exam was performed according to the departmental dose-optimization program which includes automated exposure control, adjustment of the mA and/or kV according to patient size and/or use of iterative reconstruction technique. CONTRAST:  75mL OMNIPAQUE  IOHEXOL  350 MG/ML SOLN COMPARISON:  Chest x-ray earlier same day FINDINGS: Cardiovascular: The heart size is normal. No substantial pericardial effusion. Coronary artery calcification is evident. Moderate atherosclerotic calcification is noted in the wall of the thoracic aorta. Right IJ central line tip is positioned at the SVC/RA junction. Extracardiac subcutaneous implantable defibrillator evident. Mediastinum/Nodes: 13 mm short axis paraesophageal lymph node seen anterior to the spine on 68/3. Upper normal right hilar lymph nodes. No left hilar lymphadenopathy. Juxta diaphragmatic ingested cardiac lymphadenopathy is seen in the anterior right chest there is no axillary lymphadenopathy. The esophagus has normal imaging features. Lungs/Pleura: Centrilobular and  paraseptal emphysema evident. 17 mm ground-glass opacity identified anterior left upper lobe on 39/4. 5 mm left lower lobe nodule seen on 119/4. Subsegmental atelectasis noted left base. Volume loss noted right upper and middle lobes with more pronounced collapse/consolidation in the right lower lobe. Areas of loculated pleural fluid are seen in the upper right hemithorax. Extensive pleural nodularity is seen in the right chest including plaque-like nodular pleural thickening inferolaterally on 95/3. Posterior right pleural nodule measures 4.0 x 1.0 cm on 128/3. Confluent soft tissue is seen in the anterior and posterior pleural reflections. The anterolateral pleural disease extends into the right chest wall (see image 101/3) with pathologic fracture of the anterior right fifth rib and destruction of the anterior right sixth and seventh ribs. Upper Abdomen: Visualized portion of the upper abdomen shows no acute findings. Musculoskeletal: See above for bony changes in anterior right fifth through seventh ribs. Otherwise no suspicious lytic or sclerotic osseous abnormality. IMPRESSION: 1. Extensive pleural nodularity in the right chest including plaque-like nodular pleural thickening inferolaterally. The anterolateral pleural disease extends into the right chest wall with pathologic fracture of the anterior right fifth rib and destruction of the anterior  right sixth and seventh ribs. Imaging features are highly suspicious for malignancy. 2. 17 mm ground-glass opacity anterior left upper lobe. This is nonspecific and may be infectious/inflammatory. 3. 5 mm left lower lobe pulmonary nodule. 4. 13 mm short axis paraesophageal lymph node anterior to the spine. Metastatic disease a concern. 5. Aortic Atherosclerosis (ICD10-I70.0) and Emphysema (ICD10-J43.9). Electronically Signed   By: Camellia Candle M.D.   On: 10/09/2023 06:58   US  THORACENTESIS ASP PLEURAL SPACE W/IMG GUIDE Result Date: 10/08/2023 INDICATION: 36304  Hypoxemia 36304 History of ESRD, CHF. Currently in hospital with hypoxemia due to right pleural effusion. Had thoracentesis 2 days ago with 1.85 L removed. Found to have recurrent increasing right pleural effusion on chest x-ray today. Consulted for repeat right thoracentesis. EXAM: ULTRASOUND GUIDED RIGHT THORACENTESIS MEDICATIONS: 10 mL 1% lidocaine  COMPLICATIONS: None immediate. PROCEDURE: An ultrasound guided thoracentesis was thoroughly discussed with the patient and questions answered. The benefits, risks, alternatives and complications were also discussed. The patient understands and wishes to proceed with the procedure. Written consent was obtained. Ultrasound was performed to localize and mark an adequate pocket of fluid in the RIGHT chest. The area was then prepped and draped in the normal sterile fashion. 1% Lidocaine  was used for local anesthesia. Under ultrasound guidance a 6 Fr Safe-T-Centesis catheter was introduced. Thoracentesis was performed. The catheter was removed and a dressing applied. FINDINGS: A total of approximately 520 mL of amber fluid was removed. Samples were sent to the laboratory as requested by the clinical team. IMPRESSION: Successful ultrasound guided RIGHT thoracentesis yielding 520 mL of pleural fluid. Performed and dictated by Kimble Clas, PA-C under direct supervision of Thom Hall, MD Electronically Signed   By: Thom Hall M.D.   On: 10/08/2023 14:20   DG Chest 1 View Result Date: 10/08/2023 CLINICAL DATA:  [id number] S/P thoracentesis [id number] EXAM: CHEST  1 VIEW COMPARISON:  October 08, 2023 FINDINGS: The cardiomediastinal silhouette is unchanged in contour.RIGHT chest CVC with poor visualization of the tip of the CVC secondary to underpenetration. This favored to project over the RIGHT atrium. LEFT chest AICD. Moderate RIGHT-sided pleural effusion, decreased from prior. No significant pneumothorax. There is a persistent rounded opacification along the RIGHT lateral  lung base. IMPRESSION: 1. Moderate RIGHT-sided pleural effusion, decreased from prior. No significant pneumothorax. 2. Persistent rounded opacification along the RIGHT lateral lung base. This could reflect a loculated component of the pleural effusion versus underlying mass. Recommend correlation with thoracocentesis fluid results with consideration of dedicated CT chest with contrast if clinically indicated. Electronically Signed   By: Corean Salter M.D.   On: 10/08/2023 13:07   ECHOCARDIOGRAM COMPLETE Result Date: 10/08/2023    ECHOCARDIOGRAM REPORT   Patient Name:   FERLANDO LIA Date of Exam: 10/08/2023 Medical Rec #:  [id number]     Height:       70.0 in Accession #:    [id number]    Weight:       186.1 lb Date of Birth:  24-Feb-1960     BSA:          2.024 m Patient Age:    63 years      BP:           101/51 mmHg Patient Gender: M             HR:           73 bpm. Exam Location:  Inpatient Procedure: 2D Echo, Cardiac Doppler and Color Doppler (Both Spectral and Color  Flow Doppler were utilized during procedure). Indications:    CHF - Acute Diastolic I50.31  History:        Patient has prior history of Echocardiogram examinations, most                 recent 04/11/2023. CHF and Cardiomyopathy, CAD and Previous                 Myocardial Infarction, ESRD, PAD and COPD, Arrythmias:Atrial                 Fibrillation, Signs/Symptoms:Hypotension and Chest Pain; Risk                 Factors:Hypertension, Dyslipidemia and Diabetes.  Sonographer:    Thea Norlander RCS Referring Phys: COSTIN M GHERGHE IMPRESSIONS  1. Left ventricular ejection fraction, by estimation, is 30 to 35%. The left ventricle has moderately decreased function. The left ventricle demonstrates global hypokinesis. Left ventricular diastolic parameters are consistent with Grade I diastolic dysfunction (impaired relaxation).  2. Right ventricular systolic function is normal. The right ventricular size is normal.  3. The mitral valve  is normal in structure. No evidence of mitral valve regurgitation. No evidence of mitral stenosis.  4. The aortic valve is calcified. Aortic valve regurgitation is not visualized. Aortic valve sclerosis is present, with no evidence of aortic valve stenosis.  5. The inferior vena cava is normal in size with greater than 50% respiratory variability, suggesting right atrial pressure of 3 mmHg. FINDINGS  Left Ventricle: Left ventricular ejection fraction, by estimation, is 30 to 35%. The left ventricle has moderately decreased function. The left ventricle demonstrates global hypokinesis. The left ventricular internal cavity size was normal in size. There is no left ventricular hypertrophy. Left ventricular diastolic parameters are consistent with Grade I diastolic dysfunction (impaired relaxation).  LV Wall Scoring: The posterior wall is akinetic. Right Ventricle: The right ventricular size is normal. No increase in right ventricular wall thickness. Right ventricular systolic function is normal. Left Atrium: Left atrial size was normal in size. Right Atrium: Right atrial size was normal in size. Pericardium: There is no evidence of pericardial effusion. Mitral Valve: The mitral valve is normal in structure. No evidence of mitral valve regurgitation. No evidence of mitral valve stenosis. Tricuspid Valve: The tricuspid valve is normal in structure. Tricuspid valve regurgitation is not demonstrated. No evidence of tricuspid stenosis. Aortic Valve: The aortic valve is calcified. Aortic valve regurgitation is not visualized. Aortic valve sclerosis is present, with no evidence of aortic valve stenosis. Aortic valve peak gradient measures 2.7 mmHg. Pulmonic Valve: The pulmonic valve was normal in structure. Pulmonic valve regurgitation is not visualized. No evidence of pulmonic stenosis. Aorta: The aortic root is normal in size and structure. Venous: The inferior vena cava is normal in size with greater than 50% respiratory  variability, suggesting right atrial pressure of 3 mmHg. IAS/Shunts: No atrial level shunt detected by color flow Doppler.  LEFT VENTRICLE PLAX 2D LVIDd:         5.40 cm   Diastology LVIDs:         4.30 cm   LV e' medial:    4.46 cm/s LV PW:         0.90 cm   LV E/e' medial:  8.9 LV IVS:        1.00 cm   LV e' lateral:   5.22 cm/s LVOT diam:     2.20 cm   LV E/e' lateral: 7.6 LV SV:  50 LV SV Index:   25 LVOT Area:     3.80 cm  RIGHT VENTRICLE            IVC RV S prime:     9.57 cm/s  IVC diam: 1.20 cm TAPSE (M-mode): 1.9 cm LEFT ATRIUM           Index        RIGHT ATRIUM          Index LA diam:      3.20 cm 1.58 cm/m   RA Area:     8.68 cm LA Vol (A2C): 26.9 ml 13.29 ml/m  RA Volume:   13.70 ml 6.77 ml/m LA Vol (A4C): 22.1 ml 10.92 ml/m  AORTIC VALVE AV Area (Vmax): 3.13 cm AV Vmax:        82.00 cm/s AV Peak Grad:   2.7 mmHg LVOT Vmax:      67.60 cm/s LVOT Vmean:     46.100 cm/s LVOT VTI:       0.132 m  AORTA Ao Root diam: 3.50 cm Ao Asc diam:  2.90 cm MITRAL VALVE MV Area (PHT): 4.06 cm    SHUNTS MV Decel Time: 187 msec    Systemic VTI:  0.13 m MV E velocity: 39.80 cm/s  Systemic Diam: 2.20 cm MV A velocity: 97.50 cm/s MV E/A ratio:  0.41 Oneil Parchment MD Electronically signed by Oneil Parchment MD Signature Date/Time: 10/08/2023/12:32:34 PM    Final    DG CHEST PORT 1 VIEW Result Date: 10/08/2023 CLINICAL DATA:  Hypoxemia. EXAM: PORTABLE CHEST 1 VIEW COMPARISON:  10/06/2023 FINDINGS: Right-sided dialysis catheter remains in place. Increasing right pleural effusion, tracking laterally. Associated opacity in the right hemithorax may represent compressive atelectasis. Subsegmental atelectasis at the left lung base. No visible pneumothorax. No pulmonary edema. Left-sided pacemaker remains in place. IMPRESSION: Increasing right pleural effusion, tracking laterally. Associated opacity in the right hemithorax may represent compressive atelectasis. Electronically Signed   By: Andrea Gasman M.D.   On:  10/08/2023 10:20   DG Chest 1 View Result Date: 10/06/2023 CLINICAL DATA:  Right thoracentesis EXAM: CHEST  1 VIEW COMPARISON:  10/06/2023 FINDINGS: Single frontal view of the chest demonstrates stable defibrillator and right internal jugular dialysis catheter. Decreased right pleural effusion after interval thoracentesis, with mild to moderate residual fluid and consolidation at the right lung base. No evidence of pneumothorax. Left chest is clear. IMPRESSION: 1. No evidence of pneumothorax after right thoracentesis. Decreased right effusion, with mild to moderate residual fluid and consolidation at the right lung base. Electronically Signed   By: Ozell Daring M.D.   On: 10/06/2023 16:53   US  THORACENTESIS ASP PLEURAL SPACE W/IMG GUIDE Result Date: 10/06/2023 INDICATION: Patient with request for therapeutic thoracentesis for large right pleural effusion. EXAM: ULTRASOUND GUIDED THERAPEUTIC RIGHT THORACENTESIS MEDICATIONS: 9 mL 1% lidocaine  COMPLICATIONS: None immediate. PROCEDURE: An ultrasound guided thoracentesis was thoroughly discussed with the patient and questions answered. The benefits, risks, alternatives and complications were also discussed. The patient understands and wishes to proceed with the procedure. Written consent was obtained. Ultrasound was performed to localize and mark an adequate pocket of fluid in the right chest. The area was then prepped and draped in the normal sterile fashion. 1% Lidocaine  was used for local anesthesia. Under ultrasound guidance a 6 Fr Safe-T-Centesis catheter was introduced. Thoracentesis was performed. The catheter was removed and a dressing applied. FINDINGS: A total of approximately 1850 mL of amber fluid was removed. IMPRESSION: Successful ultrasound guided right  thoracentesis yielding 1850 mL of pleural fluid. Performed and dictated by Laymon Coast, NP Electronically Signed   By: Marcey Moan M.D.   On: 10/06/2023 16:49   US  Abdomen Limited RUQ  (LIVER/GB) Result Date: 10/06/2023 CLINICAL DATA:  [id number] RUQ abdominal pain [id number] EXAM: ULTRASOUND ABDOMEN LIMITED RIGHT UPPER QUADRANT COMPARISON:  09/28/2009 FINDINGS: Gallbladder: Single small gallstone. Moderate circumferential wall thickening of the gallbladder. No pericholecystic fluid. No sonographic Murphy's sign noted by sonographer. Common bile duct: Diameter: 4 mm Liver: Normal echogenicity. No focal lesion identified. No intrahepatic biliary ductal dilation. Portal vein is patent on color Doppler imaging with normal direction of blood flow towards the liver. Right Kidney: Possible posterior shadowing overlying the partially visualized renal parenchyma, possibly nonobstructive calculi. Other: Moderate right pleural effusion. IMPRESSION: 1. Mild gallbladder wall thickening, which in the absence of pericholecystic fluid and a sonographic Murphy's sign, may be due to reactive to biliary stasis, upper abdominal inflammation, acute hepatitis, or volume overload from either CHF or renal failure. Laboratory correlation recommended. 2. Moderate volume right pleural effusion. 3. Scattered posterior shadowing overlying the partially visualized right kidney, possibly representing nonobstructive renal calculi. Electronically Signed   By: Rogelia Myers M.D.   On: 10/06/2023 15:41   DG Chest Port 1 View Result Date: 10/06/2023 CLINICAL DATA:  Shortness of breath EXAM: PORTABLE CHEST 1 VIEW COMPARISON:  10/05/2023 FINDINGS: Right central venous catheter with tip over the cavoatrial junction region. Cardiac pacemaker. Shallow inspiration. Cardiac enlargement. Large right pleural effusion with basilar atelectasis and/or consolidation. This is mildly progressed since prior study, possibly due to differences in patient positioning. The left lung is clear. Visualized mediastinal contours appear intact. Degenerative changes in the spine. IMPRESSION: Large right pleural effusion with basilar atelectasis and/or  consolidation demonstrating mild progression. Electronically Signed   By: Elsie Gravely M.D.   On: 10/06/2023 15:08   DG Chest 2 View Result Date: 10/05/2023 CLINICAL DATA:  Shortness of breath. EXAM: CHEST - 2 VIEW COMPARISON:  05/21/2023 FINDINGS: Right IJ dialysis catheter unchanged. Left-sided pacer unchanged. Lungs are adequately inflated as the left lung is clear. Interval worsening of a moderate to large right pleural effusion likely with associated compressive atelectasis. Effusion occupies approximately 3/4 of the right hemithorax. Cardiomediastinal silhouette and remainder of the exam is unchanged. IMPRESSION: Interval worsening of a moderate to large right pleural effusion likely with associated compressive atelectasis. Electronically Signed   By: Toribio Agreste M.D.   On: 10/05/2023 11:43    Microbiology No results found for this or any previous visit (from the past 240 hours).  Lab Basic Metabolic Panel: Recent Labs  Lab 10/16/23 2026 10/16/23 2144 10/17/23 0516 10/17/23 1615 10/18/23 0513 10/18/23 1612 10-Nov-2023 0514 Nov 10, 2023 1608  NA 136   < > 136 136 132* 134* 133* 135  K 4.4   < > 4.4 4.4 4.5 4.9 4.6 4.8  CL 103  --  102 102 101 100 102 102  CO2 22  --  23 23 22 24  20* 20*  GLUCOSE 292*  --  132* 113* 122* 147* 127* 163*  BUN 32*  --  26* 22 20 23 22  24*  CREATININE 1.67*  --  1.52* 1.45* 1.54* 1.54* 1.50* 1.52*  CALCIUM  7.9*  --  8.4* 8.5* 8.5* 8.6* 8.7* 8.9  MG 2.4  --  2.6*  --  2.5*  --  2.8*  --   PHOS 4.0  --  2.4* 2.6 2.4* 3.3 3.1  3.0 3.8   < > =  values in this interval not displayed.   Liver Function Tests: Recent Labs  Lab 10/16/23 2026 10/17/23 0516 10/17/23 1615 10/18/23 0513 10/18/23 1612 2023/10/30 0514 30-Oct-2023 1608  AST 45*  --   --   --   --   --   --   ALT 29  --   --   --   --   --   --   ALKPHOS 126  --   --   --   --   --   --   BILITOT 0.8  --   --   --   --   --   --   PROT 6.4*  --   --   --   --   --   --   ALBUMIN  1.9*   < >  2.0* 1.8* 1.9* 1.9* 1.9*   < > = values in this interval not displayed.   No results for input(s): LIPASE, AMYLASE in the last 168 hours. No results for input(s): AMMONIA in the last 168 hours. CBC: Recent Labs  Lab 10/16/23 2026 10/16/23 2144 10/17/23 0516 10/18/23 0513 October 30, 2023 0514  WBC 34.2*  --  33.6* 27.6* 37.3*  HGB 12.1* 12.2* 12.5* 12.9* 13.3  HCT 38.4* 36.0* 40.0 40.6 41.7  MCV 89.9  --  89.3 89.0 89.1  PLT 267  --  225 258 278   Cardiac Enzymes: No results for input(s): CKTOTAL, CKMB, CKMBINDEX, TROPONINI in the last 168 hours. Sepsis Labs: Recent Labs  Lab 10/16/23 2026 10/16/23 2308 10/17/23 0516 10/18/23 0513 2023/10/30 0514  WBC 34.2*  --  33.6* 27.6* 37.3*  LATICACIDVEN 3.3* 1.4 1.0  --   --     Procedures/Operations     Amond Speranza 10/23/2023, 11:06 AM

## 2023-11-11 DEATH — deceased

## 2023-12-20 ENCOUNTER — Ambulatory Visit: Admitting: Family Medicine

## 2024-02-14 ENCOUNTER — Other Ambulatory Visit (HOSPITAL_COMMUNITY): Payer: Self-pay

## 2024-03-27 ENCOUNTER — Ambulatory Visit: Admitting: Cardiology
